# Patient Record
Sex: Female | Born: 1950 | Race: White | Hispanic: No | Marital: Married | Smoking: Never smoker
Health system: Southern US, Community
[De-identification: ages and names within clinical notes are randomized; demographics above are authoritative.]

## PROBLEM LIST (undated history)

## (undated) DIAGNOSIS — G43909 Migraine, unspecified, not intractable, without status migrainosus: Secondary | ICD-10-CM

## (undated) DIAGNOSIS — A8481 Powassan virus disease: Secondary | ICD-10-CM

## (undated) DIAGNOSIS — T8859XA Other complications of anesthesia, initial encounter: Secondary | ICD-10-CM

## (undated) DIAGNOSIS — G259 Extrapyramidal and movement disorder, unspecified: Secondary | ICD-10-CM

## (undated) DIAGNOSIS — M1990 Unspecified osteoarthritis, unspecified site: Secondary | ICD-10-CM

## (undated) DIAGNOSIS — G4700 Insomnia, unspecified: Secondary | ICD-10-CM

## (undated) DIAGNOSIS — E785 Hyperlipidemia, unspecified: Secondary | ICD-10-CM

## (undated) DIAGNOSIS — Z780 Asymptomatic menopausal state: Secondary | ICD-10-CM

## (undated) DIAGNOSIS — A848 Other tick-borne viral encephalitis: Secondary | ICD-10-CM

## (undated) DIAGNOSIS — L659 Nonscarring hair loss, unspecified: Secondary | ICD-10-CM

## (undated) DIAGNOSIS — Z9289 Personal history of other medical treatment: Secondary | ICD-10-CM

## (undated) DIAGNOSIS — T4145XA Adverse effect of unspecified anesthetic, initial encounter: Secondary | ICD-10-CM

## (undated) DIAGNOSIS — I491 Atrial premature depolarization: Secondary | ICD-10-CM

## (undated) HISTORY — PX: TRACHEOSTOMY: SUR1362

## (undated) HISTORY — DX: Personal history of other medical treatment: Z92.89

## (undated) HISTORY — PX: OTHER SURGICAL HISTORY: SHX169

## (undated) HISTORY — DX: Hyperlipidemia, unspecified: E78.5

## (undated) HISTORY — DX: Atrial premature depolarization: I49.1

## (undated) HISTORY — DX: Nonscarring hair loss, unspecified: L65.9

## (undated) HISTORY — DX: Insomnia, unspecified: G47.00

## (undated) HISTORY — PX: PEG PLACEMENT: SHX5437

## (undated) HISTORY — DX: Extrapyramidal and movement disorder, unspecified: G25.9

## (undated) HISTORY — DX: Migraine, unspecified, not intractable, without status migrainosus: G43.909

## (undated) HISTORY — DX: Asymptomatic menopausal state: Z78.0

---

## 1981-01-25 HISTORY — PX: RHINOPLASTY: SUR1284

## 1998-01-01 ENCOUNTER — Ambulatory Visit (HOSPITAL_BASED_OUTPATIENT_CLINIC_OR_DEPARTMENT_OTHER): Admission: RE | Admit: 1998-01-01 | Discharge: 1998-01-01 | Payer: Self-pay | Admitting: *Deleted

## 1998-01-25 HISTORY — PX: HERNIA REPAIR: SHX51

## 1999-07-20 ENCOUNTER — Other Ambulatory Visit: Admission: RE | Admit: 1999-07-20 | Discharge: 1999-07-20 | Payer: Self-pay | Admitting: Obstetrics and Gynecology

## 1999-12-21 ENCOUNTER — Other Ambulatory Visit: Admission: RE | Admit: 1999-12-21 | Discharge: 1999-12-21 | Payer: Self-pay | Admitting: Obstetrics and Gynecology

## 1999-12-22 ENCOUNTER — Other Ambulatory Visit: Admission: RE | Admit: 1999-12-22 | Discharge: 1999-12-22 | Payer: Self-pay | Admitting: Obstetrics and Gynecology

## 1999-12-22 ENCOUNTER — Encounter (INDEPENDENT_AMBULATORY_CARE_PROVIDER_SITE_OTHER): Payer: Self-pay | Admitting: Specialist

## 2001-05-15 ENCOUNTER — Other Ambulatory Visit: Admission: RE | Admit: 2001-05-15 | Discharge: 2001-05-15 | Payer: Self-pay | Admitting: Obstetrics and Gynecology

## 2004-03-09 ENCOUNTER — Ambulatory Visit: Payer: Self-pay | Admitting: Internal Medicine

## 2004-03-19 ENCOUNTER — Ambulatory Visit: Payer: Self-pay | Admitting: Internal Medicine

## 2004-09-21 ENCOUNTER — Ambulatory Visit: Payer: Self-pay | Admitting: Internal Medicine

## 2004-09-24 ENCOUNTER — Ambulatory Visit: Payer: Self-pay | Admitting: Internal Medicine

## 2007-09-20 ENCOUNTER — Ambulatory Visit: Payer: Self-pay | Admitting: Sports Medicine

## 2007-09-20 DIAGNOSIS — M7742 Metatarsalgia, left foot: Secondary | ICD-10-CM

## 2007-09-20 DIAGNOSIS — M2140 Flat foot [pes planus] (acquired), unspecified foot: Secondary | ICD-10-CM | POA: Insufficient documentation

## 2007-09-20 DIAGNOSIS — M2020 Hallux rigidus, unspecified foot: Secondary | ICD-10-CM

## 2007-09-20 DIAGNOSIS — M7741 Metatarsalgia, right foot: Secondary | ICD-10-CM

## 2008-01-31 ENCOUNTER — Ambulatory Visit: Payer: Self-pay | Admitting: Sports Medicine

## 2008-01-31 DIAGNOSIS — M21619 Bunion of unspecified foot: Secondary | ICD-10-CM

## 2009-03-19 ENCOUNTER — Encounter (INDEPENDENT_AMBULATORY_CARE_PROVIDER_SITE_OTHER): Payer: Self-pay | Admitting: *Deleted

## 2009-04-11 ENCOUNTER — Telehealth: Payer: Self-pay | Admitting: Internal Medicine

## 2009-04-14 ENCOUNTER — Encounter: Payer: Self-pay | Admitting: Internal Medicine

## 2009-06-26 ENCOUNTER — Ambulatory Visit: Payer: Self-pay | Admitting: Internal Medicine

## 2010-02-24 NOTE — Letter (Signed)
Summary: Colonoscopy Letter  [facility]  [address] [place], [place] [address]   Phone: [phone]  Fax: [phone]      March 19, 2009 MRN: [id number]   Judy Spears [address] [place], [place]  [address]   Dear Judy Spears,   According to your medical record, it is time for you to schedule a Colonoscopy. The American Cancer Society recommends this procedure as a method to detect early colon cancer. Patients with a family history of colon cancer, or a personal history of colon polyps or inflammatory bowel disease are at increased risk.  This letter has beeen generated based on the recommendations made at the time of your procedure. If you feel that in your particular situation this may no longer apply, please contact our office.  Please call our office at [phone] to schedule this appointment or to update your records at your earliest convenience.  Thank you for cooperating with [place] to provide you with the very best care possible.   Sincerely,  Hedwig Morton. Juanda Chance, M.D.  [facility] [phone]

## 2010-02-24 NOTE — Letter (Signed)
Summary: [facility] Instructions  [facility]  [address] [place], [place] [address]   Phone: [phone]  Fax: [phone]       Judy Spears    Jun 15, 1950    MRN: [id number]       Procedure Day /Date: 05/26/09 Monday     Arrival Time: 7:30 am     Procedure Time: 8:00 am     Location of Procedure:                    _x _  [facility] (4th Floor)  PREPARATION FOR COLONOSCOPY WITH MIRALAX  Starting 5 days prior to your procedure (05/22/09)  do not eat nuts, seeds, popcorn, corn, beans, peas,  salads, or any raw vegetables.  Do not take any fiber supplements (e.g. Metamucil, Citrucel, and Benefiber). ____________________________________________________________________________________________________   THE DAY BEFORE YOUR PROCEDURE         DATE: 05/25/09 DAY: Sunday  1   Drink clear liquids the entire day-NO SOLID FOOD  2   Do not drink anything colored red or purple.  Avoid juices with pulp.  No orange juice.  3   Drink at least 64 oz. (8 glasses) of fluid/clear liquids during the day to prevent dehydration and help the prep work efficiently.  CLEAR LIQUIDS INCLUDE: Water Jello Ice Popsicles Tea (sugar ok, no milk/cream) Powdered fruit flavored drinks Coffee (sugar ok, no milk/cream) Gatorade Juice: apple, white grape, white cranberry  Lemonade Clear bullion, consomm, broth Carbonated beverages (any kind) Strained chicken noodle soup Hard Candy  4   Mix the entire bottle of Miralax with 64 oz. of Gatorade/Powerade in the morning and put in the refrigerator to chill.  5   At 3:00 pm take 2 Dulcolax/Bisacodyl tablets.  6   At 4:30 pm take one Reglan/Metoclopramide tablet.  7  Starting at 5:00 pm drink one 8 oz glass of the Miralax mixture every 15-20 minutes until you have finished drinking the entire 64 oz.  You should finish drinking prep around 7:30 or 8:00 pm.  8   If you are nauseated, you may take the 2nd Reglan/Metoclopramide tablet at  6:30 pm.        9    At 8:00 pm take 2 more DULCOLAX/Bisacodyl tablets.     THE DAY OF YOUR PROCEDURE      DATE:  05/26/09 DAY: Monday  You may drink clear liquids until 6:00 am  (2 HOURS BEFORE PROCEDURE).   MEDICATION INSTRUCTIONS  Unless otherwise instructed, you should take regular prescription medications with a small sip of water as early as possible the morning of your procedure.        OTHER INSTRUCTIONS  You will need a responsible adult at least 59 years of age to accompany you and drive you home.   This person must remain in the waiting room during your procedure.  Wear loose fitting clothing that is easily removed.  Leave jewelry and other valuables at home.  However, you may wish to bring a book to read or an iPod/MP3 player to listen to music as you wait for your procedure to start.  Remove all body piercing jewelry and leave at home.  Total time from sign-in until discharge is approximately 2-3 hours.  You should go home directly after your procedure and rest.  You can resume normal activities the day after your procedure.  The day of your procedure you should not:   Drive   Make legal decisions  Operate machinery   Drink alcohol   Return to work  You will receive specific instructions about eating, activities and medications before you leave.   The above instructions have been reviewed and explained to me by  Dr Lina Sar    I fully understand and can verbalize these instructions _____________________________ Date _______

## 2010-02-24 NOTE — Procedures (Signed)
Summary: Colonoscopy  Patient: Judy Spears Note: All result statuses are Final unless otherwise noted.  Tests: (1) Colonoscopy (COL)   COL Colonoscopy           DONE     [facility]     [address]     [place], [place]  [address]           COLONOSCOPY PROCEDURE REPORT           PATIENT:  Judy Spears, Judy Spears  MR#:  [id number]     BIRTHDATE:  1950-06-15, 59 yrs. old  GENDER:  female     ENDOSCOPIST:  Hedwig Morton. Juanda Chance, MD     REF. BY:  Mia Creek, M.D.     PROCEDURE DATE:  06/26/2009     PROCEDURE:  Colonoscopy 19147     ASA CLASS:  Class I     INDICATIONS:  father with colon cancer in 70ies     MEDICATIONS:   Versed 7 mg, Fentanyl 75 mcg           DESCRIPTION OF PROCEDURE:   After the risks benefits and     alternatives of the procedure were thoroughly explained, informed     consent was obtained.  Digital rectal exam was performed and     revealed no rectal masses.   The LB [id number]-[id number] endoscope     was introduced through the anus and advanced to the cecum, which     was identified by both the appendix and ileocecal valve, without     limitations.  The quality of the prep was excellent, using     MiraLax.  The instrument was then slowly withdrawn as the colon     was fully examined.     <<PROCEDUREIMAGES>>           FINDINGS:  No polyps or cancers were seen (see image1, image2,     image3, image4, and image5).   Retroflexed views in the rectum     revealed no abnormalities.    The scope was then withdrawn from     the patient and the procedure completed.           COMPLICATIONS:  None     ENDOSCOPIC IMPRESSION:     1) No polyps or cancers     2) Normal colonoscopy     RECOMMENDATIONS:     1) high fiber diet     REPEAT EXAM:  In 7 year(s) for. 5-10 year interval is OK, this was     a second normal colonoscopy, the family member was >9 year old at     the time of his Colon cancer dignosis           ______________________________     Hedwig Morton. Juanda Chance, MD       CC:           n.     eSIGNED:   Hedwig Morton. Sharah Finnell at 06/26/2009 02:10 PM           Joanna Hews, [phone]  Note: An exclamation mark (!) indicates a result that was not dispersed into the flowsheet. Document Creation Date: 06/26/2009 2:10 PM _______________________________________________________________________  (1) Order result status: Final Collection or observation date-time: 06/26/2009 13:59 Requested date-time:  Receipt date-time:  Reported date-time:  Referring Physician:   Ordering Physician: Lina Sar [phone]) Specimen Source:  Source: Launa Grill Order Number: [id number] Lab site:   Appended Document: Colonoscopy    Clinical Lists Changes  Observations:  Added new observation of COLONNXTDUE: 06/2016 (06/26/2009 16:14)

## 2010-02-24 NOTE — Progress Notes (Signed)
Summary: Schedule Colonoscopy  Phone Note Other Incoming   Summary of Call: Per Dr Juanda Chance, she spoke to patient who needs recall colonoscopy in May 2011. Patient has been scheduled and instructions have been created. Dr Juanda Chance states that she will give patient the instructions and have her sign the appropriate paperwork.  Initial call taken by: Hortense Ramal CMA Duncan Dull),  April 11, 2009 10:51 AM    New/Updated Medications: MIRALAX   POWD (POLYETHYLENE GLYCOL 3350) As per prep  instructions. REGLAN 10 MG  TABS (METOCLOPRAMIDE HCL) As per prep instructions. DULCOLAX 5 MG  TBEC (BISACODYL) Day before procedure take 2 at 3pm and 2 at 8pm. Prescriptions: DULCOLAX 5 MG  TBEC (BISACODYL) Day before procedure take 2 at 3pm and 2 at 8pm.  #4 x 0   Entered by:   Hortense Ramal CMA (AAMA)   Authorized by:   Hart Carwin MD   Signed by:   Hortense Ramal CMA (AAMA) on 04/11/2009   Method used:   Print then Give to Patient   RxID:   [id number] REGLAN 10 MG  TABS (METOCLOPRAMIDE HCL) As per prep instructions.  #2 x 0   Entered by:   Hortense Ramal CMA (AAMA)   Authorized by:   Hart Carwin MD   Signed by:   Hortense Ramal CMA (AAMA) on 04/11/2009   Method used:   Print then Give to Patient   RxID:   [id number] MIRALAX   POWD (POLYETHYLENE GLYCOL 3350) As per prep  instructions.  #255 grams x 0   Entered by:   Hortense Ramal CMA (AAMA)   Authorized by:   Hart Carwin MD   Signed by:   Hortense Ramal CMA (AAMA) on 04/11/2009   Method used:   Print then Give to Patient   RxID:   [id number]   Appended Document: Orders Update    Clinical Lists Changes  Problems: Added new problem of SCREENING, COLON CANCER (ICD-V76.51) Orders: Added new Test order of Colonoscopy (Colon) - Signed

## 2010-12-04 ENCOUNTER — Other Ambulatory Visit: Payer: Self-pay | Admitting: Gynecology

## 2010-12-04 DIAGNOSIS — R928 Other abnormal and inconclusive findings on diagnostic imaging of breast: Secondary | ICD-10-CM

## 2010-12-07 ENCOUNTER — Ambulatory Visit
Admission: RE | Admit: 2010-12-07 | Discharge: 2010-12-07 | Disposition: A | Discharge status: Home / Self Care | Payer: Private Health Insurance - Indemnity | Source: Ambulatory Visit | Attending: Gynecology | Admitting: Gynecology

## 2010-12-07 ENCOUNTER — Other Ambulatory Visit: Payer: Self-pay | Admitting: Gynecology

## 2010-12-07 DIAGNOSIS — R928 Other abnormal and inconclusive findings on diagnostic imaging of breast: Secondary | ICD-10-CM

## 2010-12-07 DIAGNOSIS — R921 Mammographic calcification found on diagnostic imaging of breast: Secondary | ICD-10-CM

## 2010-12-08 ENCOUNTER — Ambulatory Visit
Admission: RE | Admit: 2010-12-08 | Discharge: 2010-12-08 | Disposition: A | Discharge status: Home / Self Care | Payer: Private Health Insurance - Indemnity | Source: Ambulatory Visit | Attending: Gynecology | Admitting: Gynecology

## 2010-12-08 DIAGNOSIS — R921 Mammographic calcification found on diagnostic imaging of breast: Secondary | ICD-10-CM

## 2010-12-21 ENCOUNTER — Other Ambulatory Visit (INDEPENDENT_AMBULATORY_CARE_PROVIDER_SITE_OTHER): Payer: Self-pay | Admitting: General Surgery

## 2010-12-21 ENCOUNTER — Encounter (INDEPENDENT_AMBULATORY_CARE_PROVIDER_SITE_OTHER): Payer: Self-pay | Admitting: General Surgery

## 2010-12-21 ENCOUNTER — Ambulatory Visit (INDEPENDENT_AMBULATORY_CARE_PROVIDER_SITE_OTHER): Payer: Self-pay | Admitting: General Surgery

## 2010-12-21 ENCOUNTER — Ambulatory Visit (INDEPENDENT_AMBULATORY_CARE_PROVIDER_SITE_OTHER): Payer: Private Health Insurance - Indemnity | Admitting: General Surgery

## 2010-12-21 VITALS — BP 128/78 | HR 60 | Temp 97.2°F | Resp 18 | Ht 64.0 in | Wt 116.2 lb

## 2010-12-21 DIAGNOSIS — N6099 Unspecified benign mammary dysplasia of unspecified breast: Secondary | ICD-10-CM

## 2010-12-21 DIAGNOSIS — N6019 Diffuse cystic mastopathy of unspecified breast: Secondary | ICD-10-CM

## 2010-12-21 DIAGNOSIS — R921 Mammographic calcification found on diagnostic imaging of breast: Secondary | ICD-10-CM

## 2010-12-21 DIAGNOSIS — N62 Hypertrophy of breast: Secondary | ICD-10-CM

## 2010-12-21 NOTE — Progress Notes (Signed)
Patient ID: Judy Spears, female   DOB: 08/15/1950, 60 y.o.   MRN: [id number]  Chief Complaint  Patient presents with  . New Evaluation    eval of ALH     HPI Judy Spears is a 60 y.o. female.  Referred by Dr. Elizabeth Spears HPI This is a 60-year-old female who is otherwise very healthy who presents after a screening mammogram. She had a bilateral mammogram with some left-sided microcalcifications. These underwent a biopsy showing atypical lobular hyperplasia. She has no history of any breast complaints her abnormal mammograms. She has no family history of any breast cancer. She has no other complaints referable to her breasts either.  History reviewed. No pertinent past medical history.  Past Surgical History  Procedure Date  . Hernia repair 2000    RIH  . Rhinoplasty 1983  . Cesarean section 1986    Family History  Problem Relation Age of Onset  . Stroke Mother   . Hypertension Mother     Social History History  Substance Use Topics  . Smoking status: Never Smoker   . Smokeless tobacco: Never Used  . Alcohol Use: Yes    Allergies  Allergen Reactions  . Tetracyclines & Related     Blisters     Current Outpatient Prescriptions  Medication Sig Dispense Refill  . ALPRAZolam (XANAX) 0.5 MG tablet       . COMBIPATCH 0.05-0.14 MG/DAY         Review of Systems Review of Systems  Constitutional: Negative for fever, chills and unexpected weight change.  HENT: Negative for hearing loss, congestion, sore throat, trouble swallowing and voice change.   Eyes: Negative for visual disturbance.  Respiratory: Negative for cough and wheezing.   Cardiovascular: Negative for chest pain, palpitations and leg swelling.  Gastrointestinal: Negative for nausea, vomiting, abdominal pain, diarrhea, constipation, blood in stool, abdominal distention and anal bleeding.  Genitourinary: Negative for hematuria, vaginal bleeding and difficulty urinating.  Musculoskeletal: Negative for  arthralgias.  Skin: Negative for rash and wound.  Neurological: Positive for headaches. Negative for seizures and syncope.  Hematological: Negative for adenopathy. Does not bruise/bleed easily.  Psychiatric/Behavioral: Negative for confusion.    Blood pressure 128/78, pulse 60, temperature 97.2 F (36.2 C), temperature source Temporal, resp. rate 18, height 5' 4" (1.626 m), weight 116 lb 4 oz (52.731 kg), last menstrual period 12/01/2010.  Physical Exam Physical Exam  Constitutional: She appears well-developed and well-nourished.  Eyes: No scleral icterus.  Neck: Neck supple.  Cardiovascular: Normal rate, regular rhythm and normal heart sounds.   Pulmonary/Chest: Effort normal and breath sounds normal. She has no wheezes. She has no rales. Right breast exhibits no inverted nipple, no mass, no nipple discharge, no skin change and no tenderness. Left breast exhibits no inverted nipple, no mass, no nipple discharge, no skin change and no tenderness. Breasts are symmetrical.  Lymphadenopathy:    She has no cervical adenopathy.    She has no axillary adenopathy.       Right: No supraclavicular adenopathy present.       Left: No supraclavicular adenopathy present.    Data Reviewed Reviewed mammogram and ultrasound, pathology results *RADIOLOGY REPORT*  Clinical Data: The patient returns for evaluation of a possible  mass in the right breast and calcifications in the left breast.  DIGITAL DIAGNOSTIC BILATERAL LIMITED MAMMOGRAM AND BREAST  ULTRASOUND:  Comparison: 12/01/2009, 11/26/2008 from [facility] and  [facility].  Findings: Additional views on the right demonstrate a partially    circumscribed, partially obscured oval nodule in the right upper  inner quadrant posteriorly.  Additional magnification views on the left demonstrate a 4 mm  cluster of slightly pleomorphic microcalcifications in the left  anterior upper outer quadrant.  On physical exam, no mass is  palpated in either breast.  Ultrasound is performed, showing an oval cyst at 2 o'clock 5-6 cm  from the right nipple measuring 5 x 2 x 4 mm.  The calcifications in the left breast are suspicious and biopsy is  suggested. Stereotactic core needle biopsy and surgical excisional  biopsy were discussed with the patient. I suggested stereotactic  core needle biopsy and the patient agreed with this plan.  IMPRESSION:  1. 4 mm cluster of suspicious calcifications in the left anterior  upper outer quadrant. Stereotactic core needle biopsy has been  scheduled for 12/08/2010.  2. Benign cyst in the right upper inner quadrant.   Assessment    Left breast ALH    Plan    We had an extensive discussion about atypical lobular hyperplasia and lobular neoplasia. We discussed the rationale for excisional biopsy as well as the option of observation. I think given her health, age, and findings on core biopsy that the best plan would be to proceed with an excisional biopsy. We discussed a wire localization biopsy of this area. We discussed the placement of the wire as well as the technical performance of the operation. We discussed the risks including, but not limited to, bleeding, infection, missing the clip, and further surgery if there are cancer cells that are found on the biopsy. She and her husband both understand this and we'll proceed on December 7 with surgery.       Judy Spears 12/21/2010, 9:05 AM    

## 2010-12-24 ENCOUNTER — Encounter (HOSPITAL_BASED_OUTPATIENT_CLINIC_OR_DEPARTMENT_OTHER): Payer: Self-pay | Admitting: *Deleted

## 2010-12-24 NOTE — Progress Notes (Signed)
Pt will not come back to [place] until eve prior to surgery Dr Dwain Sarna told her she can do same day labs She will have to have ekg,cbc, and bmet am surg on arrival from [place]. Anesthesia needs to be aware of post op headaches

## 2010-12-25 ENCOUNTER — Other Ambulatory Visit (INDEPENDENT_AMBULATORY_CARE_PROVIDER_SITE_OTHER): Payer: Self-pay

## 2010-12-25 DIAGNOSIS — N6320 Unspecified lump in the left breast, unspecified quadrant: Secondary | ICD-10-CM

## 2011-01-01 ENCOUNTER — Encounter (HOSPITAL_BASED_OUTPATIENT_CLINIC_OR_DEPARTMENT_OTHER): Payer: Self-pay | Admitting: Certified Registered Nurse Anesthetist

## 2011-01-01 ENCOUNTER — Other Ambulatory Visit: Payer: Self-pay

## 2011-01-01 ENCOUNTER — Ambulatory Visit (HOSPITAL_BASED_OUTPATIENT_CLINIC_OR_DEPARTMENT_OTHER)
Admission: RE | Admit: 2011-01-01 | Discharge: 2011-01-01 | Disposition: A | Discharge status: Home / Self Care | Payer: Private Health Insurance - Indemnity | Source: Ambulatory Visit | Attending: General Surgery | Admitting: General Surgery

## 2011-01-01 ENCOUNTER — Other Ambulatory Visit (INDEPENDENT_AMBULATORY_CARE_PROVIDER_SITE_OTHER): Payer: Self-pay | Admitting: General Surgery

## 2011-01-01 ENCOUNTER — Ambulatory Visit
Admission: RE | Admit: 2011-01-01 | Discharge: 2011-01-01 | Disposition: A | Discharge status: Home / Self Care | Payer: Private Health Insurance - Indemnity | Source: Ambulatory Visit | Attending: General Surgery | Admitting: General Surgery

## 2011-01-01 ENCOUNTER — Ambulatory Visit (HOSPITAL_BASED_OUTPATIENT_CLINIC_OR_DEPARTMENT_OTHER): Payer: Private Health Insurance - Indemnity | Admitting: Certified Registered Nurse Anesthetist

## 2011-01-01 ENCOUNTER — Encounter (HOSPITAL_BASED_OUTPATIENT_CLINIC_OR_DEPARTMENT_OTHER)
Admission: RE | Disposition: A | Discharge status: Home / Self Care | Payer: Self-pay | Source: Ambulatory Visit | Attending: General Surgery

## 2011-01-01 DIAGNOSIS — R928 Other abnormal and inconclusive findings on diagnostic imaging of breast: Secondary | ICD-10-CM | POA: Insufficient documentation

## 2011-01-01 DIAGNOSIS — N6320 Unspecified lump in the left breast, unspecified quadrant: Secondary | ICD-10-CM

## 2011-01-01 DIAGNOSIS — N6019 Diffuse cystic mastopathy of unspecified breast: Secondary | ICD-10-CM

## 2011-01-01 HISTORY — PX: BREAST BIOPSY: SHX20

## 2011-01-01 HISTORY — DX: Adverse effect of unspecified anesthetic, initial encounter: T41.45XA

## 2011-01-01 HISTORY — DX: Other complications of anesthesia, initial encounter: T88.59XA

## 2011-01-01 HISTORY — DX: Unspecified osteoarthritis, unspecified site: M19.90

## 2011-01-01 LAB — CBC
HCT: 38.4 % (ref 36.0–46.0)
Hemoglobin: 12.9 g/dL (ref 12.0–15.0)
MCH: 29.2 pg (ref 26.0–34.0)
MCHC: 33.6 g/dL (ref 30.0–36.0)
RDW: 13.2 % (ref 11.5–15.5)

## 2011-01-01 LAB — BASIC METABOLIC PANEL
BUN: 10 mg/dL (ref 6–23)
Creatinine, Ser: 0.76 mg/dL (ref 0.50–1.10)
GFR calc Af Amer: >90 mL/min (ref >90)
GFR calc non Af Amer: >90 mL/min (ref >90)
Glucose, Bld: 92 mg/dL (ref 70–99)
Potassium: 3.8 mEq/L (ref 3.5–5.1)

## 2011-01-01 SURGERY — BREAST BIOPSY WITH NEEDLE LOCALIZATION
Anesthesia: General | Site: Breast | Laterality: Left | Wound class: Clean

## 2011-01-01 MED ORDER — FENTANYL CITRATE 0.05 MG/ML IJ SOLN: 25.0000 ug | INTRAMUSCULAR | Status: DC | PRN

## 2011-01-01 MED ORDER — LACTATED RINGERS IV SOLN
INTRAVENOUS | Status: DC
  Administered 2011-01-01 (×2): via INTRAVENOUS

## 2011-01-01 MED ORDER — OXYCODONE-ACETAMINOPHEN 5-325 MG PO TABS: 1.0000 | ORAL_TABLET | Freq: Four times a day (QID) | ORAL | Status: AC | PRN

## 2011-01-01 MED ORDER — BUPIVACAINE HCL (PF) 0.25 % IJ SOLN
INTRAMUSCULAR | Status: DC | PRN
  Administered 2011-01-01: 20 mL

## 2011-01-01 MED ORDER — CEFAZOLIN SODIUM 1-5 GM-% IV SOLN
1.0000 g | INTRAVENOUS | Status: AC
  Administered 2011-01-01: 1 g via INTRAVENOUS

## 2011-01-01 MED ORDER — PROPOFOL 10 MG/ML IV EMUL
INTRAVENOUS | Status: DC | PRN
  Administered 2011-01-01: 200 mg via INTRAVENOUS

## 2011-01-01 MED ORDER — ONDANSETRON HCL 4 MG/2ML IJ SOLN
INTRAMUSCULAR | Status: DC | PRN
  Administered 2011-01-01: 4 mg via INTRAVENOUS

## 2011-01-01 MED ORDER — LIDOCAINE HCL (CARDIAC) 20 MG/ML IV SOLN
INTRAVENOUS | Status: DC | PRN
  Administered 2011-01-01: 40 mg via INTRAVENOUS

## 2011-01-01 MED ORDER — DEXAMETHASONE SODIUM PHOSPHATE 4 MG/ML IJ SOLN
INTRAMUSCULAR | Status: DC | PRN
  Administered 2011-01-01: 10 mg via INTRAVENOUS

## 2011-01-01 MED ORDER — PROMETHAZINE HCL 25 MG/ML IJ SOLN: 6.2500 mg | INTRAMUSCULAR | Status: DC | PRN

## 2011-01-01 MED ORDER — MIDAZOLAM HCL 5 MG/5ML IJ SOLN
INTRAMUSCULAR | Status: DC | PRN
  Administered 2011-01-01: 1 mg via INTRAVENOUS

## 2011-01-01 MED ORDER — FENTANYL CITRATE 0.05 MG/ML IJ SOLN
INTRAMUSCULAR | Status: DC | PRN
  Administered 2011-01-01: 50 ug via INTRAVENOUS

## 2011-01-01 MED ORDER — KETOROLAC TROMETHAMINE 30 MG/ML IJ SOLN
INTRAMUSCULAR | Status: DC | PRN
  Administered 2011-01-01: 30 mg via INTRAVENOUS

## 2011-01-01 SURGICAL SUPPLY — 54 items
ADH SKN CLS APL DERMABOND .7 (GAUZE/BANDAGES/DRESSINGS)
APL SKNCLS STERI-STRIP NONHPOA (GAUZE/BANDAGES/DRESSINGS) ×1
APPLIER CLIP 9.375 MED OPEN (MISCELLANEOUS)
APR CLP MED 9.3 20 MLT OPN (MISCELLANEOUS)
BENZOIN TINCTURE PRP APPL 2/3 (GAUZE/BANDAGES/DRESSINGS) ×2 IMPLANT
BINDER BREAST LRG (GAUZE/BANDAGES/DRESSINGS) ×1 IMPLANT
BINDER BREAST MEDIUM (GAUZE/BANDAGES/DRESSINGS) IMPLANT
BINDER BREAST XLRG (GAUZE/BANDAGES/DRESSINGS) IMPLANT
BINDER BREAST XXLRG (GAUZE/BANDAGES/DRESSINGS) IMPLANT
BLADE SURG 15 STRL LF DISP TIS (BLADE) ×1 IMPLANT
BLADE SURG 15 STRL SS (BLADE) ×2
CANISTER SUCTION 1200CC (MISCELLANEOUS) IMPLANT
CHLORAPREP W/TINT 26ML (MISCELLANEOUS) ×2 IMPLANT
CLIP APPLIE 9.375 MED OPEN (MISCELLANEOUS) IMPLANT
CLOTH BEACON ORANGE TIMEOUT ST (SAFETY) ×2 IMPLANT
COVER MAYO STAND STRL (DRAPES) ×2 IMPLANT
COVER TABLE BACK 60X90 (DRAPES) ×2 IMPLANT
DECANTER SPIKE VIAL GLASS SM (MISCELLANEOUS) IMPLANT
DERMABOND ADVANCED (GAUZE/BANDAGES/DRESSINGS)
DERMABOND ADVANCED .7 DNX12 (GAUZE/BANDAGES/DRESSINGS) IMPLANT
DEVICE DUBIN W/COMP PLATE 8390 (MISCELLANEOUS) ×1 IMPLANT
DRAPE PED LAPAROTOMY (DRAPES) ×2 IMPLANT
DRSG TEGADERM 4X4.75 (GAUZE/BANDAGES/DRESSINGS) ×2 IMPLANT
ELECT COATED BLADE 2.86 ST (ELECTRODE) ×2 IMPLANT
ELECT REM PT RETURN 9FT ADLT (ELECTROSURGICAL) ×2
ELECTRODE REM PT RTRN 9FT ADLT (ELECTROSURGICAL) ×1 IMPLANT
GAUZE SPONGE 4X4 12PLY STRL LF (GAUZE/BANDAGES/DRESSINGS) ×2 IMPLANT
GLOVE BIO SURGEON STRL SZ7 (GLOVE) ×4 IMPLANT
GLOVE BIOGEL PI IND STRL 7.5 (GLOVE) ×1 IMPLANT
GLOVE BIOGEL PI INDICATOR 7.5 (GLOVE) ×1
GLOVE ECLIPSE 6.5 STRL STRAW (GLOVE) ×1 IMPLANT
GOWN PREVENTION PLUS XLARGE (GOWN DISPOSABLE) ×3 IMPLANT
KIT MARKER MARGIN INK (KITS) ×1 IMPLANT
NDL HYPO 25X1 1.5 SAFETY (NEEDLE) ×1 IMPLANT
NEEDLE HYPO 25X1 1.5 SAFETY (NEEDLE) ×2 IMPLANT
NS IRRIG 1000ML POUR BTL (IV SOLUTION) ×1 IMPLANT
PACK BASIN DAY SURGERY FS (CUSTOM PROCEDURE TRAY) ×2 IMPLANT
PENCIL BUTTON HOLSTER BLD 10FT (ELECTRODE) ×2 IMPLANT
SLEEVE SCD COMPRESS KNEE MED (MISCELLANEOUS) ×2 IMPLANT
SPONGE LAP 4X18 X RAY DECT (DISPOSABLE) ×2 IMPLANT
STRIP CLOSURE SKIN 1/2X4 (GAUZE/BANDAGES/DRESSINGS) ×2 IMPLANT
SUT MNCRL AB 4-0 PS2 18 (SUTURE) ×2 IMPLANT
SUT SILK 2 0 SH (SUTURE) ×2 IMPLANT
SUT VIC AB 2-0 SH 27 (SUTURE) ×2
SUT VIC AB 2-0 SH 27XBRD (SUTURE) ×1 IMPLANT
SUT VIC AB 3-0 SH 27 (SUTURE) ×2
SUT VIC AB 3-0 SH 27X BRD (SUTURE) ×1 IMPLANT
SUT VICRYL AB 3 0 TIES (SUTURE) IMPLANT
SYR CONTROL 10ML LL (SYRINGE) ×2 IMPLANT
TOWEL OR 17X24 6PK STRL BLUE (TOWEL DISPOSABLE) ×2 IMPLANT
TOWEL OR NON WOVEN STRL DISP B (DISPOSABLE) ×2 IMPLANT
TUBE CONNECTING 20X1/4 (TUBING) IMPLANT
WATER STERILE IRR 1000ML POUR (IV SOLUTION) ×1 IMPLANT
YANKAUER SUCT BULB TIP NO VENT (SUCTIONS) IMPLANT

## 2011-01-01 NOTE — Anesthesia Procedure Notes (Signed)
Procedure Name: LMA Insertion Date/Time: 01/01/2011 10:27 AM Performed by: Syon Tews D Pre-anesthesia Checklist: Patient identified, Emergency Drugs available, Suction available and Patient being monitored Patient Re-evaluated:Patient Re-evaluated prior to inductionOxygen Delivery Method: Circle System Utilized Preoxygenation: Pre-oxygenation with 100% oxygen Intubation Type: IV induction Ventilation: Mask ventilation without difficulty LMA: LMA inserted LMA Size: 4.0 Number of attempts: 1 Placement Confirmation: positive ETCO2 Tube secured with: Tape Dental Injury: Teeth and Oropharynx as per pre-operative assessment

## 2011-01-01 NOTE — Transfer of Care (Signed)
Immediate Anesthesia Transfer of Care Note  Patient: Judy Spears  Procedure(s) Performed:  BREAST BIOPSY WITH NEEDLE LOCALIZATION - left breast wire localization biopsy  Patient Location: PACU  Anesthesia Type: General  Level of Consciousness: awake, alert , oriented and patient cooperative  Airway & Oxygen Therapy: Patient Spontanous Breathing and Patient connected to face mask oxygen  Post-op Assessment: Report given to PACU RN and Post -op Vital signs reviewed and stable  Post vital signs: Reviewed and stable  Complications: No apparent anesthesia complications

## 2011-01-01 NOTE — Anesthesia Postprocedure Evaluation (Signed)
  Anesthesia Post-op Note  Patient: Judy Spears  Procedure(s) Performed:  BREAST BIOPSY WITH NEEDLE LOCALIZATION - left breast wire localization biopsy  Patient Location: PACU  Anesthesia Type: General  Level of Consciousness: awake, alert  and oriented  Airway and Oxygen Therapy: Patient Spontanous Breathing  Post-op Pain: none  Post-op Assessment: Post-op Vital signs reviewed, Patient's Cardiovascular Status Stable, Respiratory Function Stable, Patent Airway, No signs of Nausea or vomiting, Adequate PO intake and Pain level controlled  Post-op Vital Signs: Reviewed and stable  Complications: No apparent anesthesia complications

## 2011-01-01 NOTE — Interval H&P Note (Signed)
History and Physical Interval Note:  01/01/2011 10:03 AM  Judy Spears  has presented today for surgery, with the diagnosis of left breast   The various methods of treatment have been discussed with the patient and family. After consideration of risks, benefits and other options for treatment, the patient has consented to  Procedure(s): LEFT BREAST BIOPSY WITH NEEDLE LOCALIZATION as a surgical intervention .  The patients' history has been reviewed, patient examined, no change in status, stable for surgery.  I have reviewed the patients' chart and labs.  Questions were answered to the patient's satisfaction.     [name]

## 2011-01-01 NOTE — H&P (View-Only) (Signed)
Patient ID: ILZE ROSELLI, female   DOB: [birth year], 60 y.o.   MRN: [id number]  Chief Complaint  Patient presents with  . New Evaluation    eval of ALH     HPI SHAQUANDA GRAVES is a 60 y.o. female.  Referred by Dr. Cain Saupe HPI This is a 60 year old female who is otherwise very healthy who presents after a screening mammogram. She had a bilateral mammogram with some left-sided microcalcifications. These underwent a biopsy showing atypical lobular hyperplasia. She has no history of any breast complaints her abnormal mammograms. She has no family history of any breast cancer. She has no other complaints referable to her breasts either.  History reviewed. No pertinent past medical history.  Past Surgical History  Procedure Date  . Hernia repair 2000    RIH  . Rhinoplasty 1983  . Cesarean section 1986    Family History  Problem Relation Age of Onset  . Stroke Mother   . Hypertension Mother     Social History History  Substance Use Topics  . Smoking status: Never Smoker   . Smokeless tobacco: Never Used  . Alcohol Use: Yes    Allergies  Allergen Reactions  . Tetracyclines & Related     Blisters     Current Outpatient Prescriptions  Medication Sig Dispense Refill  . ALPRAZolam (XANAX) 0.5 MG tablet       . COMBIPATCH 0.05-0.14 MG/DAY         Review of Systems Review of Systems  Constitutional: Negative for fever, chills and unexpected weight change.  HENT: Negative for hearing loss, congestion, sore throat, trouble swallowing and voice change.   Eyes: Negative for visual disturbance.  Respiratory: Negative for cough and wheezing.   Cardiovascular: Negative for chest pain, palpitations and leg swelling.  Gastrointestinal: Negative for nausea, vomiting, abdominal pain, diarrhea, constipation, blood in stool, abdominal distention and anal bleeding.  Genitourinary: Negative for hematuria, vaginal bleeding and difficulty urinating.  Musculoskeletal: Negative for  arthralgias.  Skin: Negative for rash and wound.  Neurological: Positive for headaches. Negative for seizures and syncope.  Hematological: Negative for adenopathy. Does not bruise/bleed easily.  Psychiatric/Behavioral: Negative for confusion.    Blood pressure 128/78, pulse 60, temperature 97.2 F (36.2 C), temperature source Temporal, resp. rate 18, height 5\' 4"  (1.626 m), weight 116 lb 4 oz (52.731 kg), last menstrual period 12/01/2010.  Physical Exam Physical Exam  Constitutional: She appears well-developed and well-nourished.  Eyes: No scleral icterus.  Neck: Neck supple.  Cardiovascular: Normal rate, regular rhythm and normal heart sounds.   Pulmonary/Chest: Effort normal and breath sounds normal. She has no wheezes. She has no rales. Right breast exhibits no inverted nipple, no mass, no nipple discharge, no skin change and no tenderness. Left breast exhibits no inverted nipple, no mass, no nipple discharge, no skin change and no tenderness. Breasts are symmetrical.  Lymphadenopathy:    She has no cervical adenopathy.    She has no axillary adenopathy.       Right: No supraclavicular adenopathy present.       Left: No supraclavicular adenopathy present.    Data Reviewed Reviewed mammogram and ultrasound, pathology results *RADIOLOGY REPORT*  Clinical Data: The patient returns for evaluation of a possible  mass in the right breast and calcifications in the left breast.  DIGITAL DIAGNOSTIC BILATERAL LIMITED MAMMOGRAM AND BREAST  ULTRASOUND:  Comparison: 12/01/2009, 11/26/2008 from [facility] and  [facility].  Findings: Additional views on the right demonstrate a partially  circumscribed, partially obscured oval nodule in the right upper  inner quadrant posteriorly.  Additional magnification views on the left demonstrate a 4 mm  cluster of slightly pleomorphic microcalcifications in the left  anterior upper outer quadrant.  On physical exam, no mass is  palpated in either breast.  Ultrasound is performed, showing an oval cyst at 2 o'clock 5-6 cm  from the right nipple measuring 5 x 2 x 4 mm.  The calcifications in the left breast are suspicious and biopsy is  suggested. Stereotactic core needle biopsy and surgical excisional  biopsy were discussed with the patient. I suggested stereotactic  core needle biopsy and the patient agreed with this plan.  IMPRESSION:  1. 4 mm cluster of suspicious calcifications in the left anterior  upper outer quadrant. Stereotactic core needle biopsy has been  scheduled for 12/08/2010.  2. Benign cyst in the right upper inner quadrant.   Assessment    Left breast ALH    Plan    We had an extensive discussion about atypical lobular hyperplasia and lobular neoplasia. We discussed the rationale for excisional biopsy as well as the option of observation. I think given her health, age, and findings on core biopsy that the best plan would be to proceed with an excisional biopsy. We discussed a wire localization biopsy of this area. We discussed the placement of the wire as well as the technical performance of the operation. We discussed the risks including, but not limited to, bleeding, infection, missing the clip, and further surgery if there are cancer cells that are found on the biopsy. She and her husband both understand this and we'll proceed on December 7 with surgery.       Elza Sortor 12/21/2010, 9:05 AM

## 2011-01-01 NOTE — Anesthesia Preprocedure Evaluation (Addendum)
Anesthesia Evaluation  Patient identified by MRN, date of birth, ID band Patient awake    Reviewed: Allergy & Precautions, H&P , NPO status , Patient's Chart, lab work & pertinent test results  History of Anesthesia Complications Negative for: history of anesthetic complications  Airway Mallampati: I      Dental No notable dental hx. (+) Teeth Intact and Dental Advisory Given   Pulmonary neg pulmonary ROS,  clear to auscultation  Pulmonary exam normal       Cardiovascular neg cardio ROS Regular Normal    Neuro/Psych Negative Neurological ROS     GI/Hepatic negative GI ROS, Neg liver ROS,   Endo/Other  Negative Endocrine ROS  Renal/GU negative Renal ROS     Musculoskeletal negative musculoskeletal ROS (+)   Abdominal   Peds  Hematology   Anesthesia Other Findings   Reproductive/Obstetrics negative OB ROS                          Anesthesia Physical Anesthesia Plan  ASA: I  Anesthesia Plan: General   Post-op Pain Management:    Induction: Intravenous  Airway Management Planned: LMA  Additional Equipment:   Intra-op Plan:   Post-operative Plan:   Informed Consent: I have reviewed the patients History and Physical, chart, labs and discussed the procedure including the risks, benefits and alternatives for the proposed anesthesia with the patient or authorized representative who has indicated his/her understanding and acceptance.   Dental advisory given  Plan Discussed with: CRNA and Surgeon  Anesthesia Plan Comments: (Plan routine monitors, GA- LMA OK)        Anesthesia Quick Evaluation

## 2011-01-01 NOTE — Op Note (Signed)
Preoperative diagnosis: Left breast atypical lobular hyperplasia on core biopsy with mammographic abnormality Postoperative diagnosis: Same as above  Procedure: Left breast wire localization biopsy Surgeon: Dr. Harden Mo Anesthesia: Gen. With LMA Specimens: Left breast tissue marked with paint kit Estimated blood loss: Minimal Complications: None Drains: None Sponge needle count was correct at end of operation Findings: Mammographic confirmation of removal of wire and clip  Indications: This is a 60 year old female who underwent a regular routine screening mammogram with a finding of left breast microcalcifications. Her biopsy showed atypical lobular hyperplasia. I discussed with she and her husband a wire localization biopsy to assure there is no cancer associated with this. The possibility of some observation was discussed. We decided on a wire localization biopsy and we discussed the risk and benefits associated with that.  Procedure: She first had a wire placed by Dr. Mayford Knife at the breast center. I discussed this with Dr. Mayford Knife prior to beginning. I had her mammograms available for my review in the operating room. She was taken to the operating room and was administered 1 g of intravenous cefazolin. Sequential compression devices were placed on her legs prior to beginning. She was then placed under general anesthesia with an LMA. Her left breast was prepped and draped in the standard sterile surgical fashion. Surgical timeout was performed  I then infiltrated quarter percent Marcaine throughout the surgical field. I then made a radial incision near her areola. I then used cautery to remove the wire and the surrounding tissue. A Faxitron mammogram was taken which confirmed removal of the lesion. I used a paint kit to mark the lesion. Hemostasis was then obtained. I closed this with 2-0 Vicryl 3-0 Vicryl and 4-0 Monocryl. I infiltrated additional 10 cc of quarter percent Marcaine.  Steri-Strips and a sterile dressing were placed. A breast binder was also placed. She tolerated this well and was extubated in the operating room.

## 2011-01-05 ENCOUNTER — Encounter (HOSPITAL_BASED_OUTPATIENT_CLINIC_OR_DEPARTMENT_OTHER): Payer: Self-pay | Admitting: General Surgery

## 2011-01-15 ENCOUNTER — Encounter (INDEPENDENT_AMBULATORY_CARE_PROVIDER_SITE_OTHER): Payer: Self-pay | Admitting: General Surgery

## 2011-01-18 ENCOUNTER — Encounter (INDEPENDENT_AMBULATORY_CARE_PROVIDER_SITE_OTHER): Payer: Self-pay | Admitting: General Surgery

## 2011-01-18 ENCOUNTER — Ambulatory Visit (INDEPENDENT_AMBULATORY_CARE_PROVIDER_SITE_OTHER): Payer: Private Health Insurance - Indemnity | Admitting: General Surgery

## 2011-01-18 DIAGNOSIS — Z09 Encounter for follow-up examination after completed treatment for conditions other than malignant neoplasm: Secondary | ICD-10-CM

## 2011-01-18 NOTE — Progress Notes (Signed)
Subjective:     Patient ID: Judy Spears, female   DOB: 04/12/1950, 60 y.o.   MRN: [id number]  HPI 35 yof s/p left breast biopsy for [facility]. Final path is all benign.  She has swelling left breast, no drainage, no fevers, has gotten better last 24 hours.  Comes in to discuss path as well as for postop exam  Review of Systems     Objective:   Physical Exam Left breast hematoma, soft, no infection, wound clean    Assessment:     S/p left breast biopsy    Plan:     Reassured that hematoma will resolve over time Discussed her pathology She will call if she needs anything

## 2011-01-29 ENCOUNTER — Encounter (INDEPENDENT_AMBULATORY_CARE_PROVIDER_SITE_OTHER): Payer: Self-pay | Admitting: General Surgery

## 2011-12-15 ENCOUNTER — Institutional Professional Consult (permissible substitution): Payer: Private Health Insurance - Indemnity | Admitting: Cardiology

## 2011-12-20 ENCOUNTER — Encounter: Payer: Self-pay | Admitting: *Deleted

## 2011-12-21 ENCOUNTER — Encounter: Payer: Self-pay | Admitting: Cardiology

## 2011-12-21 ENCOUNTER — Ambulatory Visit (INDEPENDENT_AMBULATORY_CARE_PROVIDER_SITE_OTHER): Payer: Private Health Insurance - Indemnity | Admitting: Cardiology

## 2011-12-21 VITALS — BP 110/60 | HR 72 | Ht 64.0 in | Wt 119.4 lb

## 2011-12-21 DIAGNOSIS — Z9189 Other specified personal risk factors, not elsewhere classified: Secondary | ICD-10-CM | POA: Insufficient documentation

## 2011-12-21 DIAGNOSIS — E785 Hyperlipidemia, unspecified: Secondary | ICD-10-CM

## 2011-12-21 DIAGNOSIS — E7800 Pure hypercholesterolemia, unspecified: Secondary | ICD-10-CM

## 2011-12-21 MED ORDER — ASPIRIN EC 81 MG PO TBEC: 81.0000 mg | DELAYED_RELEASE_TABLET | Freq: Every day | ORAL | Status: DC

## 2011-12-21 NOTE — Patient Instructions (Addendum)
Your physician recommends that you schedule a follow-up appointment in: AS NEEDED  START ASPIRIN 81 MG ONCE DAILY WITH FOOD  Your physician recommends that you return for lab work in: FASTING=LATE December  CONSIDER TAKING CO ENZYME Q-10 200 MG ONCE DAILY

## 2011-12-21 NOTE — Progress Notes (Signed)
Patient ID: Judy Spears, female   DOB: 1950-04-27, 61 y.o.   MRN: [id number] PCP: Dr. Corliss Blacker  61 yo with history of hyperlipidemia presents for cardiology evaluation.  She has been healthy in general throughout her life.  She is quite active.  She walks 4 miles briskly at a time and does vigorous aerobic exercise.  Some weight lifting.  No exertional dyspnea or chest pain/tightness/neck pain. The main concern today is elevated LDL coupled with a very high lipoprotein(a) level and a high LDL particle number.  She has other members of her family with elevated Lp(a).  Her mother had a stroke at age 58 that does not appear to have been atrial fibrillation-related.  Her grandfather had an MI at 49 and an uncle had CABG in his early 47s.  She recently started on atorvastatin 10 mg daily.  She does not take ASA.   ECG: NSR, normal  Labs (9/13): Lp(a) 200, TGs 58, HDL 68, LDL 148, LDL particle number 1772, K 4.3, creatinine 0.7  PMH: 1. Benign essential tremor 2. Hyperlipidemia 3. Migraine headaches  SH: Nonsmoker.  Occasional ETOH.  Married.   FH: Father with colon and prostate CA, mother with CVA at 52, grandfather with MI at 37, uncle with CABG at 46.   ROS: All systems reviewed and negative except as per HPI.   Current Outpatient Prescriptions  Medication Sig Dispense Refill  . ALPRAZolam (XANAX) 0.5 MG tablet Take 0.5 mg by mouth at bedtime as needed.       [name] atorvastatin (LIPITOR) 10 MG tablet Take 10 mg by mouth daily.      . calcium-vitamin D (OSCAL WITH D) 250-125 MG-UNIT per tablet Take 1 tablet by mouth daily.        . COMBIPATCH 0.05-0.14 MG/DAY Place 1 patch onto the skin 2 (two) times a week.       . finasteride (PROSCAR) 5 MG tablet Take 2.5 mg by mouth daily.      . Multiple Vitamin (MULTIVITAMIN) capsule Take 1 capsule by mouth daily.        . ondansetron (ZOFRAN) 8 MG tablet Take by mouth every 8 (eight) hours as needed.      . rizatriptan (MAXALT) 10 MG tablet Take 10 mg by  mouth as needed. May repeat in 2 hours if needed      . aspirin EC 81 MG tablet Take 1 tablet (81 mg total) by mouth daily.  90 tablet  3    BP 110/60  Pulse 72  Ht 5\' 4"  (1.626 m)  Wt 119 lb 6.4 oz (54.159 kg)  BMI 20.49 kg/m2  LMP 12/01/2010 General: NAD Neck: No JVD, no thyromegaly or thyroid nodule.  Lungs: Clear to auscultation bilaterally with normal respiratory effort. CV: Nondisplaced PMI.  Heart regular S1/S2, no S3/S4, no murmur.  No peripheral edema.  No carotid bruit.  Normal pedal pulses.  Abdomen: Soft, nontender, no hepatosplenomegaly, no distention.  Skin: Intact without lesions or rashes.  Neurologic: Alert and oriented x 3.  Psych: Normal affect. Extremities: No clubbing or cyanosis.  HEENT: Normal.   Assessment/Plan:  1. Hyperlipidemia: Patient actually has a fairly high risk lipid profile.  She has high LDL but not markedly elevated, and her HDL is high as well.  More of a concern is the very high Lp(a) level and the high LDL particle number.  Lp(a) elevation is often seen in families with premature vascular disease and is a marker of elevated risk.  It  is noteworthy that her mother had a large CVA at 61 and maternal GF had MI at 71.  In the absence of the Lp(a) and LDL particle number readings, she would be in a low risk cohort.  However, given her overall picture, I think that her risk of symptomatic vascular disease over the next 10 years would be > 7.5%.  Therefore, I would recommend that she take a moderate dose of statin (at least 20 mg atorvastatin).  She is currently on atorvastatin 10 mg daily and is tolerating this without a problem.  Will repeat lipids in mid-December.  I will talk further to her at that time about increasing her atorvastatin to 20 mg daily.  She can take coenzyme Q10 200 mg daily for possible protection against the muscular side effects from statins.  2. CAD risk: As above, I would put the patient at intermediate cardiovascular risk given her  markedly elevated Lp(a) and her elevated LDL particle number.  She should be on a statin as above.  I also recommended that she start ASA 81 mg daily.  This will be especially useful for primary stroke prevention (mother had CVA at 36).  I would not recommend stress testing given lack of exertional symptoms and excellent exercise tolerance.  We talked about coronary artery calcium scoring but will hold off on this for now because I do not think it would change the way that I will treat her at this time (ASA and statin).   Marca Ancona 12/21/2011

## 2012-01-10 ENCOUNTER — Other Ambulatory Visit (INDEPENDENT_AMBULATORY_CARE_PROVIDER_SITE_OTHER): Payer: Private Health Insurance - Indemnity

## 2012-01-10 DIAGNOSIS — E7800 Pure hypercholesterolemia, unspecified: Secondary | ICD-10-CM

## 2012-01-10 LAB — LIPID PANEL
Cholesterol: 165 mg/dL (ref 0–200)
HDL: 60.9 mg/dL (ref >39.00)
VLDL: 13 mg/dL (ref 0.0–40.0)

## 2012-01-10 LAB — HEPATIC FUNCTION PANEL: Albumin: 4.4 g/dL (ref 3.5–5.2)

## 2012-01-11 LAB — LIPOPROTEIN A (LPA): Lipoprotein (a): 56 mg/dL — ABNORMAL HIGH (ref 0–30)

## 2012-01-14 ENCOUNTER — Telehealth: Payer: Self-pay | Admitting: *Deleted

## 2012-01-14 DIAGNOSIS — E785 Hyperlipidemia, unspecified: Secondary | ICD-10-CM

## 2012-01-14 NOTE — Telephone Encounter (Signed)
Spoke with [name] for pt. Fasting lab appt scheduled for 04/11/12

## 2012-01-14 NOTE — Telephone Encounter (Signed)
01/14/12 LMTCB for pt.    Laurey Morale, MD   Thu January 13, 2012 10:00 AM        Judy Spears    MRN: [id number] DOB: [birth year]    Pt Home: [phone]       Please arrange for LFTs, lipids, and Lp(a) on Judy Spears in 3 months.

## 2012-03-14 ENCOUNTER — Telehealth: Payer: Self-pay | Admitting: Cardiology

## 2012-03-14 NOTE — Telephone Encounter (Signed)
The Assistant  Osie Cheeks) for Joanna Hews Called asking For Labs,Med List to be Sent to  [employer] [employer] every time she's in the Office, I let her Know a signed ROI Need to be Completed, she [facility] I faxed One over to [phone] For [name] to Sign.

## 2012-04-11 ENCOUNTER — Other Ambulatory Visit: Payer: Private Health Insurance - Indemnity

## 2012-04-12 ENCOUNTER — Other Ambulatory Visit: Payer: Private Health Insurance - Indemnity

## 2012-04-21 ENCOUNTER — Other Ambulatory Visit: Payer: Private Health Insurance - Indemnity

## 2012-04-26 ENCOUNTER — Encounter: Payer: Self-pay | Admitting: Cardiology

## 2012-04-28 ENCOUNTER — Other Ambulatory Visit: Payer: Private Health Insurance - Indemnity

## 2012-05-11 ENCOUNTER — Telehealth: Payer: Self-pay | Admitting: *Deleted

## 2012-05-11 DIAGNOSIS — E7800 Pure hypercholesterolemia, unspecified: Secondary | ICD-10-CM

## 2012-05-11 MED ORDER — ATORVASTATIN CALCIUM 10 MG PO TABS: 10.0000 mg | ORAL_TABLET | Freq: Every day | ORAL | Status: DC

## 2012-05-11 NOTE — Telephone Encounter (Signed)
Lab results reviewed by dr Shirlee Latch, refill for lipitor 10 mg sent to pharm. She will return for a lipomed profile in November.

## 2012-07-12 ENCOUNTER — Other Ambulatory Visit: Payer: Self-pay | Admitting: Gynecology

## 2012-07-12 DIAGNOSIS — N63 Unspecified lump in unspecified breast: Secondary | ICD-10-CM

## 2012-07-27 ENCOUNTER — Ambulatory Visit
Admission: RE | Admit: 2012-07-27 | Discharge: 2012-07-27 | Disposition: A | Discharge status: Home / Self Care | Payer: BC Managed Care – PPO | Source: Ambulatory Visit | Attending: Gynecology | Admitting: Gynecology

## 2012-07-27 ENCOUNTER — Other Ambulatory Visit: Payer: Self-pay | Admitting: Gynecology

## 2012-07-27 ENCOUNTER — Other Ambulatory Visit (HOSPITAL_COMMUNITY): Payer: Self-pay | Admitting: Diagnostic Radiology

## 2012-07-27 DIAGNOSIS — R921 Mammographic calcification found on diagnostic imaging of breast: Secondary | ICD-10-CM

## 2012-07-27 DIAGNOSIS — N63 Unspecified lump in unspecified breast: Secondary | ICD-10-CM

## 2012-07-27 HISTORY — PX: BREAST BIOPSY: SHX20

## 2013-02-16 ENCOUNTER — Other Ambulatory Visit: Payer: Self-pay | Admitting: Gynecology

## 2013-02-16 DIAGNOSIS — R921 Mammographic calcification found on diagnostic imaging of breast: Secondary | ICD-10-CM

## 2013-05-16 ENCOUNTER — Telehealth: Payer: Self-pay | Admitting: Internal Medicine

## 2013-05-16 NOTE — Telephone Encounter (Signed)
I have spoken to Randolm Idol- she wants me to review her father's records- Jacquenette Shone, lives in [place], They will fax records to [phone], attn Dr Delfin Edis

## 2013-06-04 ENCOUNTER — Ambulatory Visit
Admission: RE | Admit: 2013-06-04 | Discharge: 2013-06-04 | Disposition: A | Discharge status: Home / Self Care | Payer: BC Managed Care – PPO | Source: Ambulatory Visit | Attending: Gynecology | Admitting: Gynecology

## 2013-06-04 ENCOUNTER — Inpatient Hospital Stay: Admission: RE | Admit: 2013-06-04 | Payer: BC Managed Care – PPO | Source: Ambulatory Visit

## 2013-06-04 ENCOUNTER — Other Ambulatory Visit: Payer: Self-pay | Admitting: Gynecology

## 2013-06-04 DIAGNOSIS — N63 Unspecified lump in unspecified breast: Secondary | ICD-10-CM

## 2013-06-11 NOTE — Telephone Encounter (Signed)
Per Dr. Olevia Perches, Patient is Judy Spears DOB [birth year]. He is from [place], [place]. She will see him on June 18th at 1:00 PM.

## 2013-06-11 NOTE — Telephone Encounter (Signed)
Per Carla Drape, route to Dr. Olevia Perches.  Records were being reviewed by Dr. Olevia Perches this weekend.

## 2013-06-11 NOTE — Telephone Encounter (Signed)
Spoke with Ms. Balthaser and gave her the appointment for her father.

## 2013-11-14 ENCOUNTER — Other Ambulatory Visit: Payer: Self-pay | Admitting: Dermatology

## 2013-12-04 ENCOUNTER — Ambulatory Visit (INDEPENDENT_AMBULATORY_CARE_PROVIDER_SITE_OTHER): Payer: BC Managed Care – PPO | Admitting: Sports Medicine

## 2013-12-04 ENCOUNTER — Encounter: Payer: Self-pay | Admitting: Sports Medicine

## 2013-12-04 VITALS — BP 107/82 | Ht 64.0 in | Wt 120.0 lb

## 2013-12-04 DIAGNOSIS — M2022 Hallux rigidus, left foot: Secondary | ICD-10-CM

## 2013-12-04 DIAGNOSIS — M7742 Metatarsalgia, left foot: Secondary | ICD-10-CM | POA: Diagnosis not present

## 2013-12-04 DIAGNOSIS — R269 Unspecified abnormalities of gait and mobility: Secondary | ICD-10-CM | POA: Diagnosis not present

## 2013-12-04 DIAGNOSIS — M7741 Metatarsalgia, right foot: Secondary | ICD-10-CM

## 2013-12-04 NOTE — Patient Instructions (Signed)
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  This means the toe movement is less than necessary for efficient walking. Probably from some arthritis in the toe from past activity - dance.  Try bunion shield Spacers Foam cushion Any that help symptoms are OK to use  For arthritis on hands and great toe try: Arnica gel or cream Tumeric capsules Capsin cream is made from red peppers and helps a lot of people but will burn if you get in on eyes or lips. We have prescription anti-inflammatory creams we could try as well  We will try to protect the great toe by changes on your orthotics  I hope these last a long time as well but if the orthotics seem not to be helping we can make adjustments

## 2013-12-04 NOTE — Progress Notes (Signed)
  Judy Spears - 63 y.o. female MRN [id number]  Date of birth: [birth year]  SUBJECTIVE:  Including CC & ROS.  Patient presents today for new orthotics and gait analysis.  Patient reports improvement in her metatarsal pain since placing metatarsal pad in most of her shoes.  Currently c/o of pain and curving of her right great toe laterally.  This pain is worse with activities such as toeing up and particular painful when practicing yoga.  She has not taken any medication, but she has changed her shoes.    ROS: Review of systems otherwise negative except for information present in HPI  HISTORY: Past Medical, Surgical, Social, and Family History Reviewed & Updated per EMR. Pertinent Historical Findings include: No smoker No DM type 2  PHYSICAL EXAM:  VS: BP:107/82 mmHg  HR: bpm  TEMP: ( )  RESP:   HT:5\' 4"  (162.6 cm)   WT:120 lb (54.432 kg)  BMI:20.6 FOOT EXAM:  General: well nourished Skin of LE: warm; dry, no rashes, lesions, ecchymosis or erythema. Vascular: Dorsal pedal pulses 2+ bilaterally Neurologically: Sensation to light touch lower extremities equal and intact bilaterally.  Observation - no ecchymosis, no edema, or hematoma present  Palpation: TTP over the 1st MTP joint Normal ankle motion and strength bilaterally  Extension/flexion 5/5 strength bilaterally in toes Weight-bearing foot exam:  First ray: Hallux rigidus and hallux valgus  Second ray: longer than the first Longitudinal arch: loss of medial arch Heel position: neurtral  ASSESSMENT & PLAN: See problem based charting & AVS for pt instructions. Orthotic Fitting and Adjustment note: Patient was fitted for a : standard, cushioned, semi-rigid orthotic.  The orthotic was heated and afterward the patient stood on the orthotic blank positioned on the orthotic stand.  The patient was positioned in subtalar neutral position and 10 degrees of ankle dorsiflexion in a weight bearing stance.  After completion of molding, a  stable base was applied to the orthotic blank.  The blank was ground to a stable position for weight bearing.  Size: 7  Base: Blue EVA Posting: first and second ray poster Additional orthotic padding: bilateral metatarsal pads Patient also provided with pad for 1st ray bunion, also recommend 2-3 toe tapping to present 2nd toe hammering  Greater than 50% of the patient's visit for a total of 35 minutes, was spent conducting face-to-face counseling for bilateral foot orthotics fitting and constructing

## 2014-01-10 ENCOUNTER — Other Ambulatory Visit: Payer: Self-pay | Admitting: Gynecology

## 2014-01-10 ENCOUNTER — Telehealth: Payer: Self-pay | Admitting: Cardiology

## 2014-01-10 NOTE — Telephone Encounter (Signed)
New Message  Pt has recently had Chest tightness couple days ago(middle of night, woke her up) and was told by her [facility] (Dr. Carren Rang)  that she needed a stress test.  Pt opted to see Dr. Aundra Dubin 12/18 @ 4:00 (earliest appt). Pt requests to speak with her nurse about her current condition. Please call back and discuss.

## 2014-01-10 NOTE — Telephone Encounter (Signed)
Pt states she woke up several nights ago in the middle of the night with chest pain.  She went back to sleep and has not had any chest pain since that time.  She has exercised since then without any symptoms. She saw her GYN today and he suggested she have a stress test.  Pt advised to keep appt with Dr Aundra Dubin tomorrow and will order necessary testing  after appt with Dr Aundra Dubin.

## 2014-01-11 ENCOUNTER — Encounter: Payer: Self-pay | Admitting: Cardiology

## 2014-01-11 ENCOUNTER — Ambulatory Visit (INDEPENDENT_AMBULATORY_CARE_PROVIDER_SITE_OTHER): Payer: BC Managed Care – PPO | Admitting: Cardiology

## 2014-01-11 VITALS — BP 116/80 | HR 70 | Ht 64.0 in | Wt 120.8 lb

## 2014-01-11 DIAGNOSIS — E785 Hyperlipidemia, unspecified: Secondary | ICD-10-CM

## 2014-01-11 DIAGNOSIS — I491 Atrial premature depolarization: Secondary | ICD-10-CM

## 2014-01-11 DIAGNOSIS — R002 Palpitations: Secondary | ICD-10-CM

## 2014-01-11 NOTE — Patient Instructions (Addendum)
Your physician recommends that you return for a FASTING lipomed profile/LPa/TSH Monday December 21,2015. The lab is open from 7:30AM-5PM.   Your physician has recommended that you wear a holter monitor. Holter monitors are medical devices that record the heart's electrical activity. Doctors most often use these monitors to diagnose arrhythmias. Arrhythmias are problems with the speed or rhythm of the heartbeat. The monitor is a small, portable device. You can wear one while you do your normal daily activities. This is usually used to diagnose what is causing palpitations/syncope (passing out). 64 HOUR  Your physician wants you to follow-up in: 1 year with Dr Aundra Dubin. (December 2016).You will receive a reminder letter in the mail two months in advance. If you don't receive a letter, please call our office to schedule the follow-up appointment.

## 2014-01-11 NOTE — Progress Notes (Signed)
Exercise Treadmill Test  Pre-Exercise Testing Evaluation Rhythm: normal sinus  Rate: 73 bpm     Test  Exercise Tolerance Test Ordering MD: Loralie Champagne, MD  Interpreting MD: Loralie Champagne, MD  Unique Test No: 1  Treadmill:  1  Indication for ETT: chest pain - rule out ischemia  Contraindication to ETT: No   Stress Modality: exercise - treadmill  Cardiac Imaging Performed: non   Protocol: standard Bruce - maximal  Max BP:  198/97  Max MPHR (bpm):  158 85% MPR (bpm):  134  MPHR obtained (bpm):  162 % MPHR obtained:  102  Reached 85% MPHR (min:sec):  7:07 Total Exercise Time (min-sec):  12:00  Workload in METS:  13.4 METS Borg Scale: 17  Reason ETT Terminated:  Fatigue    ST Segment Analysis At Rest: normal ST segments - no evidence of significant ST depression With Exercise: no evidence of significant ST depression  Other Information Arrhythmia:  Yes, PACs Angina during ETT:  absent (0) Quality of ETT:  diagnostic  ETT Interpretation:  normal - no evidence of ischemia by ST analysis  Comments: PACs noted.

## 2014-01-13 DIAGNOSIS — I491 Atrial premature depolarization: Secondary | ICD-10-CM | POA: Insufficient documentation

## 2014-01-13 NOTE — Progress Notes (Signed)
Patient ID: Judy Spears, female   DOB: 21-Dec-1950, 63 y.o.   MRN: [id number] PCP: Dr. Addison Lank  63 yo with history of hyperlipidemia presents for cardiology evaluation.  She has been healthy in general throughout her life.  She is quite active.  She walks 4 miles briskly at a time and does vigorous aerobic exercise.  Some weight lifting.  No exertional dyspnea or chest pain/tightness/neck pain. She has had elevated LDL coupled with a very high lipoprotein(a) level and a high LDL particle number.  She has other members of her family with elevated Lp(a).  Her mother had a stroke at age 52 that does not appear to have been atrial fibrillation-related.  Her grandfather had an MI at 53 and an uncle had CABG in his early 40s.  She is on atorvastatin 10 mg daily.  She does not take ASA.   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  She developed a severe URI with cough/congestion/hoarseness that lasted several days.  She was very fatigued when on a 3 mile walk during this time.  It took her 2-3 weeks to really get over it.  Earlier this week, she woke up once with chest tightness (mild) that lasted about 20 minutes.  She saw her Ob/gyn earlier this week and an irregular pulse was noted.  This triggered today's visit.  She does not feel any palpitations at all.  No lightheadedness, syncope.   ECG: NSR, PACs otherwise normal.   Labs (9/13): Lp(a) 200, TGs 58, HDL 68, LDL 148, LDL particle number 1772, K 4.3, creatinine 0.7  PMH: 1. Benign essential tremor 2. Hyperlipidemia 3. Migraine headaches 4. PACs  SH: Nonsmoker.  Occasional ETOH.  Married.   FH: Father with colon and prostate CA, mother with CVA at 51, grandfather with MI at 77, uncle with CABG at 50.   ROS: All systems reviewed and negative except as per HPI.   Current Outpatient Prescriptions  Medication Sig Dispense Refill  . ALPRAZolam (XANAX) 0.5 MG tablet Take 0.5 mg by mouth at bedtime as needed. 1/4 a tab prn     . atorvastatin (LIPITOR) 10 MG tablet Take 1 tablet (10 mg total) by mouth daily. 90 tablet 4  . Azelastine HCl 0.15 % SOLN Place into the nose as needed.     . fexofenadine (ALLEGRA) 60 MG tablet Take 60 mg by mouth 2 (two) times daily.     . Melatonin 3 MG TABS Take 3 mg by mouth daily.    . Multiple Vitamin (MULTIVITAMIN) capsule Take 1 capsule by mouth daily.      . SUMAtriptan (IMITREX) 25 MG tablet Take 25 mg by mouth as needed for migraine or headache. Patient unsure of dosage     No current facility-administered medications for this visit.    BP 116/80 mmHg  Pulse 70  Ht 5\' 4"  (1.626 m)  Wt 120 lb 12.8 oz (54.795 kg)  BMI 20.73 kg/m2  LMP 12/01/2010 General: NAD Neck: No JVD, no thyromegaly or thyroid nodule.  Lungs: Clear to auscultation bilaterally with normal respiratory effort. CV: Nondisplaced PMI.  Heart regular S1/S2, no S3/S4, no murmur.  No peripheral edema.  No carotid bruit.  Normal pedal pulses.  Abdomen: Soft, nontender, no hepatosplenomegaly, no distention.  Skin: Intact without lesions or rashes.  Neurologic: Alert and oriented x 3.  Psych: Normal affect. Extremities: No clubbing or cyanosis.  HEENT: Normal.   Assessment/Plan:  1. Hyperlipidemia: Patient has  had a fairly high risk lipid profile.  She had high LDL but not markedly elevated, and her HDL was high as well.  More of a concern was the very high Lp(a) level and the high LDL particle number.  Lp(a) elevation is often seen in families with premature vascular disease and is a marker of elevated risk.  It is noteworthy that her mother had a large CVA at 80 and maternal GF had MI at 62.  In the absence of the Lp(a) and LDL particle number readings, she would be in a low risk cohort. However, given her overall picture, I think that her risk of symptomatic vascular disease over the next 10 years would be > 7.5%.  Therefore, I started her on a statin.  - Continue atorvastatin.  - I will have her get a  fasting Lp(a) and Lipomed profile. 2. PACs: Patient has asymptomatic PACs.  I suspect this is what her Ob/gyn noted on exam.  PACs may be a product of stress.  She does not have exertional dyspnea or chest pain.  No known family history of atrial fibrillation.  - I will arrange for ETT to assess for ischemia and to assess PAC burden during exercise.  - I will arrange for holter monitor x 24 hours to assess full PAC burden and to look for any long runs or atrial fibrillation.   Loralie Champagne 01/13/2014

## 2014-01-14 ENCOUNTER — Encounter: Payer: Self-pay | Admitting: Nurse Practitioner

## 2014-01-14 ENCOUNTER — Other Ambulatory Visit (INDEPENDENT_AMBULATORY_CARE_PROVIDER_SITE_OTHER): Payer: BC Managed Care – PPO | Admitting: *Deleted

## 2014-01-14 ENCOUNTER — Ambulatory Visit (INDEPENDENT_AMBULATORY_CARE_PROVIDER_SITE_OTHER): Payer: BC Managed Care – PPO | Admitting: *Deleted

## 2014-01-14 ENCOUNTER — Encounter: Payer: Self-pay | Admitting: *Deleted

## 2014-01-14 ENCOUNTER — Other Ambulatory Visit: Payer: BC Managed Care – PPO

## 2014-01-14 ENCOUNTER — Ambulatory Visit (INDEPENDENT_AMBULATORY_CARE_PROVIDER_SITE_OTHER): Payer: BC Managed Care – PPO | Admitting: Nurse Practitioner

## 2014-01-14 ENCOUNTER — Other Ambulatory Visit: Payer: Self-pay | Admitting: Cardiology

## 2014-01-14 VITALS — BP 114/74 | HR 64

## 2014-01-14 DIAGNOSIS — E785 Hyperlipidemia, unspecified: Secondary | ICD-10-CM

## 2014-01-14 DIAGNOSIS — R002 Palpitations: Secondary | ICD-10-CM

## 2014-01-14 DIAGNOSIS — I491 Atrial premature depolarization: Secondary | ICD-10-CM

## 2014-01-14 DIAGNOSIS — E038 Other specified hypothyroidism: Secondary | ICD-10-CM

## 2014-01-14 DIAGNOSIS — R079 Chest pain, unspecified: Secondary | ICD-10-CM

## 2014-01-14 LAB — CYTOLOGY - PAP

## 2014-01-14 MED ORDER — PANTOPRAZOLE SODIUM 40 MG PO TBEC: 40.0000 mg | DELAYED_RELEASE_TABLET | Freq: Every day | ORAL | Status: DC

## 2014-01-14 MED ORDER — NITROGLYCERIN 0.4 MG SL SUBL: 0.4000 mg | SUBLINGUAL_TABLET | SUBLINGUAL | Status: DC | PRN

## 2014-01-14 NOTE — Progress Notes (Signed)
Judy Spears Date of Birth: 1950/02/07 Medical Record #[id number]  History of Present Illness: Judy Spears is seen back today for a work in visit. Seen for Dr. Aundra Dubin. She is a 62 year old female with HLD. Just seen here on Friday for cardiology evaluation and had GXT. Walked 12 minutes on standard Bruce protocol. No EKG changes. Noted to have PACs.  Was here to get her heart monitor placed - told the staff that she had had chest pain over the weekend. Lots of stress. She notes a sensation of chest pressure that comes and goes - not exertional in nature. Lasts for a few minutes. Worse at night and with lying down. Did yoga last night and felt fine with no symptoms. No associated symptoms. She does not have palpitations. ?GERD. Anxious.   FH + for CAD - her brother has had CAD at age 64. Maternal grandfather with MI at 26.   Current Outpatient Prescriptions  Medication Sig Dispense Refill  . ALPRAZolam (XANAX) 0.5 MG tablet Take 0.5 mg by mouth at bedtime as needed. 1/4 a tab prn    . aspirin EC 81 MG tablet Take 1 tablet (81 mg total) by mouth daily.    [name] atorvastatin (LIPITOR) 10 MG tablet Take 1 tablet (10 mg total) by mouth daily. 90 tablet 4  . Azelastine HCl 0.15 % SOLN Place into the nose as needed.     . fexofenadine (ALLEGRA) 60 MG tablet Take 60 mg by mouth 2 (two) times daily.     . Melatonin 3 MG TABS Take 3 mg by mouth daily.    . Multiple Vitamin (MULTIVITAMIN) capsule Take 1 capsule by mouth daily.      . nitroGLYCERIN (NITROSTAT) 0.4 MG SL tablet Place 1 tablet (0.4 mg total) under the tongue every 5 (five) minutes as needed for chest pain. 100 tablet 3  . pantoprazole (PROTONIX) 40 MG tablet Take 1 tablet (40 mg total) by mouth daily. 30 tablet 0  . SUMAtriptan (IMITREX) 25 MG tablet Take 25 mg by mouth as needed for migraine or headache. Patient unsure of dosage     No current facility-administered medications for this visit.    Allergies  Allergen Reactions  .  Tetracyclines & Related     Blisters, photosensitivity    Past Medical History  Diagnosis Date  . Complication of anesthesia     pt has had headaches post op-did advise to hydra well preop  . No pertinent past medical history   . Arthritis     lt great toe  . Insomnia   . Hair loss     right-sided  . Postmenopausal   . Benign essential tremor     right hand / arm   . Hyperlipidemia   . Migraine headache     Past Surgical History  Procedure Laterality Date  . Hernia repair  2000    [facility]  . Rhinoplasty  1983  . Cesarean section  1986  . Breast biopsy  01/01/2011    Procedure: BREAST BIOPSY WITH NEEDLE LOCALIZATION;  Surgeon: Rolm Bookbinder, MD;  Location: [facility];  Service: General;  Laterality: Left;  left breast wire localization biopsy    History  Smoking status  . Never Smoker   Smokeless tobacco  . Never Used    History  Alcohol Use  . Yes    Family History  Problem Relation Age of Onset  . Stroke Mother     paralyzed on right  side/aphasia  . Hypertension Mother     had stroke after stopping BP meds  . Colon cancer Father   . Prostate cancer Father   . Heart attack Maternal Grandfather 56  . Stroke Maternal Uncle 50    Review of Systems: The review of systems is per the HPI.  All other systems were reviewed and are negative.  Physical Exam: LMP 12/01/2010 Patient is very pleasant and in no acute distress. Skin is warm and dry. Color is normal.  HEENT is unremarkable. Normocephalic/atraumatic. PERRL. Sclera are nonicteric. Neck is supple. No masses. No JVD. Lungs are clear. Cardiac exam shows a regular rate and rhythm. Abdomen is soft. Extremities are without edema. Gait and ROM are intact. No gross neurologic deficits noted.  Wt Readings from Last 3 Encounters:  01/11/14 120 lb 12.8 oz (54.795 kg)  12/04/13 120 lb (54.432 kg)  12/21/11 119 lb 6.4 oz (54.159 kg)    LABORATORY DATA/PROCEDURES:  EKG today with NSR and PACs  GXT  from Friday reviewed - noted to walk 12 minutes with no EKG changes.   Lab Results  Component Value Date   WBC 5.1 01/01/2011   HGB 12.9 01/01/2011   HCT 38.4 01/01/2011   PLT 272 01/01/2011   GLUCOSE 92 01/01/2011   CHOL 165 01/10/2012   TRIG 65.0 01/10/2012   HDL 60.90 01/10/2012   LDLCALC 91 01/10/2012   ALT 27 01/10/2012   AST 34 01/10/2012   NA 137 01/01/2011   K 3.8 01/01/2011   CL 101 01/01/2011   CREATININE 0.76 01/01/2011   BUN 10 01/01/2011   CO2 27 01/01/2011    BNP (last 3 results) No results for input(s): PROBNP in the last 8760 hours.   Assessment / Plan: 1. Chest pain -  Recent - GXT. EKG today not acute. Would place on aspirin 81 mg a day. Proceed with the heart monitor. NTG prn. Add PPI therapy as well to not have confusing symptoms.   2. PACs - has her monitor on. May need to consider beta blocker. Will get her back to see Dr. Aundra Dubin for discussion. Instructed to limit caffeine and alcohol.  3. HLD - on statin therapy  Will get her back to see Dr. Aundra Dubin for discussion. She is having her labs checked today.   Patient is agreeable to this plan and will call if any problems develop in the interim.   Burtis Junes, RN, [facility] [address] [place], [place]  [phone]

## 2014-01-14 NOTE — Progress Notes (Signed)
Patient ID: Judy Spears, female   DOB: 09-Apr-1950, 63 y.o.   MRN: [id number] [employer] 24 hour holter monitor applied to patient.

## 2014-01-14 NOTE — Addendum Note (Signed)
Addended by: Katrine Coho on: 01/14/2014 11:34 AM   Modules accepted: Orders

## 2014-01-14 NOTE — Patient Instructions (Signed)
Avoid caffeine and alcohol.  Start aspirin  81mg  daily.  Use protonix 40mg  daily for reflux.  Use nitroglycerin as needed for chest pain.   You have a  follow-up appointment with Dr Aundra Dubin Thursday January 7,2016 at [facility].      Nitroglycerin sublingual tablets What is this medicine? NITROGLYCERIN (nye troe GLI ser in) is a type of vasodilator. It relaxes blood vessels, increasing the blood and oxygen supply to your heart. This medicine is used to relieve chest pain caused by angina. It is also used to prevent chest pain before activities like climbing stairs, going outdoors in cold weather, or sexual activity. This medicine may be used for other purposes; ask your health care provider or pharmacist if you have questions. COMMON BRAND NAME(S): Nitroquick, Nitrostat, Nitrotab What should I tell my health care provider before I take this medicine? They need to know if you have any of these conditions: -anemia -head injury, recent stroke, or bleeding in the brain -liver disease -previous heart attack -an unusual or allergic reaction to nitroglycerin, other medicines, foods, dyes, or preservatives -pregnant or trying to get pregnant -breast-feeding How should I use this medicine? Take this medicine by mouth as needed. At the first sign of an angina attack (chest pain or tightness) place one tablet under your tongue. You can also take this medicine 5 to 10 minutes before an event likely to produce chest pain. Follow the directions on the prescription label. Let the tablet dissolve under the tongue. Do not swallow whole. Replace the dose if you accidentally swallow it. It will help if your mouth is not dry. Saliva around the tablet will help it to dissolve more quickly. Do not eat or drink, smoke or chew tobacco while a tablet is dissolving. If you are not better within 5 minutes after taking ONE dose of nitroglycerin, call 9-1-1 immediately to seek emergency medical care. Do not take more than 3  nitroglycerin tablets over 15 minutes. If you take this medicine often to relieve symptoms of angina, your doctor or health care professional may provide you with different instructions to manage your symptoms. If symptoms do not go away after following these instructions, it is important to call 9-1-1 immediately. Do not take more than 3 nitroglycerin tablets over 15 minutes. Talk to your pediatrician regarding the use of this medicine in children. Special care may be needed. Overdosage: If you think you have taken too much of this medicine contact a poison control center or emergency room at once. NOTE: This medicine is only for you. Do not share this medicine with others. What if I miss a dose? This does not apply. This medicine is only used as needed. What may interact with this medicine? Do not take this medicine with any of the following medications: -certain migraine medicines like ergotamine and dihydroergotamine (DHE) -medicines used to treat erectile dysfunction like sildenafil, tadalafil, and vardenafil -riociguat This medicine may also interact with the following medications: -alteplase -aspirin -heparin -medicines for high blood pressure -medicines for mental depression -other medicines used to treat angina -phenothiazines like chlorpromazine, mesoridazine, prochlorperazine, thioridazine This list may not describe all possible interactions. Give your health care provider a list of all the medicines, herbs, non-prescription drugs, or dietary supplements you use. Also tell them if you smoke, drink alcohol, or use illegal drugs. Some items may interact with your medicine. What should I watch for while using this medicine? Tell your doctor or health care professional if you feel your medicine is no  longer working. Keep this medicine with you at all times. Sit or lie down when you take your medicine to prevent falling if you feel dizzy or faint after using it. Try to remain calm. This  will help you to feel better faster. If you feel dizzy, take several deep breaths and lie down with your feet propped up, or bend forward with your head resting between your knees. You may get drowsy or dizzy. Do not drive, use machinery, or do anything that needs mental alertness until you know how this drug affects you. Do not stand or sit up quickly, especially if you are an older patient. This reduces the risk of dizzy or fainting spells. Alcohol can make you more drowsy and dizzy. Avoid alcoholic drinks. Do not treat yourself for coughs, colds, or pain while you are taking this medicine without asking your doctor or health care professional for advice. Some ingredients may increase your blood pressure. What side effects may I notice from receiving this medicine? Side effects that you should report to your doctor or health care professional as soon as possible: -blurred vision -dry mouth -skin rash -sweating -the feeling of extreme pressure in the head -unusually weak or tired Side effects that usually do not require medical attention (report to your doctor or health care professional if they continue or are bothersome): -flushing of the face or neck -headache -irregular heartbeat, palpitations -nausea, vomiting This list may not describe all possible side effects. Call your doctor for medical advice about side effects. You may report side effects to FDA at 1-800-FDA-1088. Where should I keep my medicine? Keep out of the reach of children. Store at room temperature between 20 and 25 degrees C (68 and 77 degrees F). Store in [employer]. Protect from light and moisture. Keep tightly closed. Throw away any unused medicine after the expiration date. NOTE: This sheet is a summary. It may not cover all possible information. If you have questions about this medicine, talk to your doctor, pharmacist, or health care provider.  2015, Elsevier/Gold Standard. (2012-11-09 17:57:36)

## 2014-01-15 LAB — TSH: TSH: 1.55 u[IU]/mL (ref 0.350–4.500)

## 2014-01-15 LAB — LIPOPROTEIN A (LPA): LIPOPROTEIN (A): 75 mg/dL — AB (ref 0–30)

## 2014-01-16 LAB — NMR LIPOPROFILE WITH LIPIDS
CHOLESTEROL, TOTAL: 194 mg/dL (ref 100–199)
HDL Particle Number: 34.6 umol/L (ref >=30.5)
HDL Size: 9.7 nm (ref >=9.2)
HDL-C: 70 mg/dL (ref >39)
LDL (calc): 107 mg/dL — ABNORMAL HIGH (ref 0–99)
LDL Particle Number: 1119 nmol/L — ABNORMAL HIGH (ref <1000)
LDL Size: 21.4 nm (ref >=20.8)
LP-IR Score: <25 (ref <=45)
Large HDL-P: 11.7 umol/L (ref >=4.8)
Small LDL Particle Number: 192 nmol/L (ref <=527)
TRIGLYCERIDES: 83 mg/dL (ref 0–149)
VLDL Size: 35 nm (ref <=46.6)

## 2014-01-19 ENCOUNTER — Encounter (HOSPITAL_COMMUNITY): Payer: Self-pay | Admitting: Emergency Medicine

## 2014-01-19 ENCOUNTER — Emergency Department (HOSPITAL_COMMUNITY)
Admission: EM | Admit: 2014-01-19 | Discharge: 2014-01-19 | Disposition: A | Discharge status: Home / Self Care | Payer: BC Managed Care – PPO | Source: Home / Self Care | Attending: Emergency Medicine | Admitting: Emergency Medicine

## 2014-01-19 DIAGNOSIS — J209 Acute bronchitis, unspecified: Secondary | ICD-10-CM

## 2014-01-19 DIAGNOSIS — J019 Acute sinusitis, unspecified: Secondary | ICD-10-CM

## 2014-01-19 MED ORDER — IPRATROPIUM BROMIDE 0.06 % NA SOLN: 2.0000 | Freq: Four times a day (QID) | NASAL | Status: DC

## 2014-01-19 MED ORDER — PREDNISONE 20 MG PO TABS: 20.0000 mg | ORAL_TABLET | Freq: Two times a day (BID) | ORAL | Status: DC

## 2014-01-19 MED ORDER — AMOXICILLIN-POT CLAVULANATE 875-125 MG PO TABS: 1.0000 | ORAL_TABLET | Freq: Two times a day (BID) | ORAL | Status: DC

## 2014-01-19 MED ORDER — HYDROCOD POLST-CHLORPHEN POLST 10-8 MG/5ML PO LQCR: 5.0000 mL | Freq: Two times a day (BID) | ORAL | Status: DC | PRN

## 2014-01-19 NOTE — ED Notes (Signed)
Pt states that she has had chest congestion and loss of voice intermittently since November. Pt is in no acute distress at this time.

## 2014-01-19 NOTE — ED Provider Notes (Signed)
Chief Complaint   Nasal Congestion and Cough   History of Present Illness   KLARYSSA FAUTH is a 63 year old female who has been sick since around Thanksgiving, for the past month. She seen her primary care physician about 3 times, but has not had a chest x-ray has not been prescribed any medications other than symptom medication. She relates a history of tiredness and fatigue, hoarseness and sore throat, nocturnal cough productive yellow-green sputum, aching in her rib cage, nasal congestion, ear congestion, and loose stools. She denies any fever or chills and has had no difficulty breathing or wheezing.  Review of Systems   Other than as noted above, the patient denies any of the following symptoms: Systemic:  No fevers, chills, sweats, or myalgias. Eye:  No redness or discharge. ENT:  No ear pain, headache, nasal congestion, drainage, sinus pressure, or sore throat. Neck:  No neck pain, stiffness, or swollen glands. Lungs:  No cough, sputum production, hemoptysis, wheezing, chest tightness, shortness of breath or chest pain. GI:  No abdominal pain, nausea, vomiting or diarrhea.  [place]   Past medical history, family history, social history, meds, and allergies were reviewed. She is allergic to tetracycline. She has a history of an irregular heartbeat.  Physical exam   Vital signs:  BP 126/84 mmHg  Pulse 74  Temp(Src) 98.1 F (36.7 C) (Oral)  Resp 16  SpO2 97%  LMP 12/01/2010 General:  Alert and oriented.  In no distress.  Skin warm and dry. Eye:  No conjunctival injection or drainage. Lids were normal. ENT:  TMs and canals were normal, without erythema or inflammation.  Nasal mucosa was congested with crusted yellow drainage.  Mucous membranes were moist.  Pharynx was clear with no exudate or drainage.  There were no oral ulcerations or lesions. Neck:  Supple, no adenopathy, tenderness or mass. Lungs:  No respiratory distress.  Lungs were clear to auscultation, without wheezes,  rales or rhonchi.  Breath sounds were clear and equal bilaterally.  Heart:  Regular rhythm, without gallops, murmers or rubs. Skin:  Clear, warm, and dry, without rash or lesions.  Assessment     The primary encounter diagnosis was Acute sinusitis, recurrence not specified, unspecified location. A diagnosis of Acute bronchitis, unspecified organism was also pertinent to this visit.  There is no evidence of pneumonia, strep throat, sinusitis, otitis media.    Plan    1.  Meds:  The following meds were prescribed:   Discharge Medication List as of 01/19/2014  9:59 AM    START taking these medications   Details  amoxicillin-clavulanate (AUGMENTIN) 875-125 MG per tablet Take 1 tablet by mouth 2 (two) times daily., Starting 01/19/2014, Until Discontinued, Normal    chlorpheniramine-HYDROcodone (TUSSIONEX) 10-8 MG/5ML LQCR Take 5 mLs by mouth every 12 (twelve) hours as needed for cough., Starting 01/19/2014, Until Discontinued, Normal    ipratropium (ATROVENT) 0.06 % nasal spray Place 2 sprays into both nostrils 4 (four) times daily., Starting 01/19/2014, Until Discontinued, Normal    predniSONE (DELTASONE) 20 MG tablet Take 1 tablet (20 mg total) by mouth 2 (two) times daily., Starting 01/19/2014, Until Discontinued, Normal        2.  Patient Education/Counseling:  The patient was given appropriate handouts, self care instructions, and instructed in symptomatic relief.  Instructed to get extra fluids and extra rest.    3.  Follow up:  The patient was told to follow up here if no better in 3 to 4 days, or sooner  if becoming worse in any way, and given some red flag symptoms such as increasing fever, difficulty breathing, chest pain, or persistent vomiting which would prompt immediate return.       Harden Mo, MD 01/19/14 [phone]

## 2014-01-19 NOTE — Discharge Instructions (Signed)
Most upper respiratory infections are caused by viruses and do not require antibiotics.  We try to save the antibiotics for when we really need them to prevent bacteria from developing resistance to them.  Here are a few hints about things that can be done at home to help get over an upper respiratory infection quicker: ° °Get extra sleep and extra fluids.  Get 7 to 9 hours of sleep per night and 6 to 8 glasses of water a day.  Getting extra sleep keeps the immune system from getting run down.  Most people with an upper respiratory infection are a little dehydrated.  The extra fluids also keep the secretions liquified and easier to deal with.  Also, get extra vitamin C.  4000 mg per day is the recommended dose. °For the aches, headache, and fever, acetaminophen or ibuprofen are helpful.  These can be alternated every 4 hours.  People with liver disease should avoid large amounts of acetaminophen, and people with ulcer disease, gastroesophageal reflux, gastritis, congestive heart failure, chronic kidney disease, coronary artery disease and the elderly should avoid ibuprofen. °For nasal congestion try Mucinex-D, or if you're having lots of sneezing or clear nasal drainage use Zyrtec-D. People with high blood pressure can take these if their blood pressure is controlled, if not, it's best to avoid the forms with a "D" (decongestants).  You can use the plain Mucinex, Allegra, Claritin, or Zyrtec even if your blood pressure is not controlled.   °A Saline nasal spray such as Ocean Spray can also help.  You can add a decongestant sprays such as Afrin, but you should not use the decongestant sprays for more than 3 or 4 days since they can be habituating.  Breathe Rite nasal strips can also offer a non-drug alternative treatment to nasal congestion, especially at night. °For people with symptoms of sinusitis, sleeping with your head elevated can be helpful.  For sinus pain, moist, hot compresses to the face may provide some  relief.  Many people find that inhaling steam as in a shower or from a pot of steaming water can help. °For any viral infection, zinc containing lozenges such as Cold-Eze or Zicam are helpful.  Zinc helps to fight viral infection.  Hot salt water gargles (8 oz of hot water, 1/2 tsp of table salt, and a pinch of baking soda) can give relief as well as hot beverages such as hot tea.  Sucrets extra strength lozenges will help the sore throat.  °For the cough, take Delsym 2 tsp every 12 hours.  It has also been found recently that Aleve can help control a cough.  The dose is 1 to 2 tablets twice daily with food.  This can be combined with Delsym. (Note, if you are taking ibuprofen, you should not take Aleve as well--take one or the other.) °A cool mist vaporizer will help keep your mucous membranes from drying out.  ° °It's important when you have an upper respiratory infection not to pass the infection to others.  This involves being very careful about the following: ° °Frequent hand washing or use of hand sanitizer, especially after coughing, sneezing, blowing your nose or touching your face, nose or eyes. °Do not shake hands or touch anyone and try to avoid touching surfaces that other people use such as doorknobs, shopping carts, telephones and computer keyboards. °Use tissues and dispose of them properly in a garbage can or ziplock bag. °Cough into your sleeve. °Do not let others eat or   drink after you. ° °It's also important to recognize the signs of serious illness and get evaluated if they occur: °Any respiratory infection that lasts more than 7 to 10 days.  Yellow nasal drainage and sputum are not reliable indicators of a bacterial infection, but if they last for more than 1 week, see your doctor. °Fever and sore throat can indicate strep. °Fever and cough can indicate influenza or pneumonia. °Any kind of severe symptom such as difficulty breathing, intractable vomiting, or severe pain should prompt you to see  a doctor as soon as possible. ° ° °Your body's immune system is really the thing that will get rid of this infection.  Your immune system is comprised of 2 types of specialized cells called T cells and B cells.  T cells coordinate the array of cells in your body that engulf invading bacteria or viruses while B cells orchestrate the production of antibodies that neutralize infection.  Anything we do or any medications we give you, will just strengthen your immune system or help it clear up the infection quicker.  Here are a few helpful hints to improve your immune system to help overcome this illness or to prevent future infections: °· A few vitamins can improve the health of your immune system.  That's why your diet should include plenty of fruits, vegetables, fish, nuts, and whole grains. °· Vitamin A and bet-carotene can increase the cells that fight infections (T cells and B cells).  Vitamin A is abundant in dark greens and orange vegetables such as spinach, greens, sweet potatoes, and carrots. °· Vitamin B6 contributes to the maturation of white blood cells, the cells that fight disease.  Foods with vitamin B6 include cold cereal and bananas. °· Vitamin C is credited with preventing colds because it increases white blood cells and also prevents cellular damage.  Citrus fruits, peaches and green and red bell peppers are all hight in vitamin C. °· Vitamin E is an anti-oxidant that encourages the production of natural killer cells which reject foreign invaders and B cells that produce antibodies.  Foods high in vitamin E include wheat germ, nuts and seeds. °· Foods high in omega-3 fatty acids found in foods like salmon, tuna and mackerel boost your immune system and help cells to engulf and absorb germs. °· Probiotics are good bacteria that increase your T cells.  These can be found in yogurt and are available in supplements such as Culturelle or Align. °· Moderate exercise increases the strength of your immune  system and your ability to recover from illness.  I suggest 3 to 5 moderate intensity 30 minute workouts per week.   °· Sleep is another component of maintaining a strong immune system.  It enables your body to recuperate from the day's activities, stress and work.  My recommendation is to get between 7 and 9 hours of sleep per night. °· If you smoke, try to quit completely or at least cut down.  Drink alcohol only in moderation if at all.  No more than 2 drinks daily for men or 1 for women. °· Get a flu vaccine early in the fall or if you have not gotten one yet, once this illness has run its course.  If you are over 65, a smoker, or an asthmatic, get a pneumococcal vaccine. °· My final recommendation is to maintain a healthy weight.  Excess weight can impair the immune system by interfering with the way the immune system deals with invading viruses or   bacteria. °·  °

## 2014-01-21 ENCOUNTER — Telehealth: Payer: Self-pay | Admitting: Cardiology

## 2014-01-21 NOTE — Telephone Encounter (Signed)
OK to take prednisone.  I am going to review her holter today and call.

## 2014-01-21 NOTE — Telephone Encounter (Signed)
Patient was seen at [facility] on 12/26 for acute sinusitis and bronchitis. Given Amoxil, cough syrup and Prednisone 20mg  bid for 5 days. Reports having some insomnia and chest pressure, but she is coughing a lot. Just wants to know if prednisone is safe to take with her PAc's

## 2014-01-21 NOTE — Telephone Encounter (Signed)
Patient informed. Voiced good understanding.

## 2014-01-21 NOTE — Telephone Encounter (Signed)
New Message       Patient wants to speak with someone regarding her medication that was given to her @ [facility] (Prenizone 20mg ). Please give patient a call back. Thanks

## 2014-01-22 ENCOUNTER — Telehealth: Payer: Self-pay | Admitting: *Deleted

## 2014-01-22 DIAGNOSIS — E785 Hyperlipidemia, unspecified: Secondary | ICD-10-CM

## 2014-01-22 MED ORDER — ATORVASTATIN CALCIUM 20 MG PO TABS: 20.0000 mg | ORAL_TABLET | Freq: Every day | ORAL | Status: DC

## 2014-01-22 NOTE — Telephone Encounter (Signed)
Pt notified lab results and to increase atorvastatin to 20 mg daily. Pt said she is out of town right now. I advised to double up on her 10 mg tabs of lipitor. Will call Marigene Ehlers to pt new Rx on hold for lipitor 20 mg daily. Pt also said Dr. Aundra Dubin called her with her monitor results and was told that she will need to be scheduled to wear another 1 month after her recent monitor. I said I will make note of this and I am sure that Dr. Aundra Dubin will discuss this further at her appt 01/31/14. Pt said thank you.

## 2014-01-30 ENCOUNTER — Telehealth: Payer: Self-pay | Admitting: *Deleted

## 2014-01-30 DIAGNOSIS — F439 Reaction to severe stress, unspecified: Secondary | ICD-10-CM

## 2014-01-30 DIAGNOSIS — I491 Atrial premature depolarization: Secondary | ICD-10-CM

## 2014-01-30 NOTE — Telephone Encounter (Signed)
-----   Message from Larey Dresser, MD sent at 01/21/2014 10:09 PM EST ----- [name],  I would like to arrange for her to have a repeat holter monitor x 24 hours in 4 weeks.  Thanks.

## 2014-01-31 ENCOUNTER — Encounter: Payer: Self-pay | Admitting: Cardiology

## 2014-01-31 ENCOUNTER — Ambulatory Visit (INDEPENDENT_AMBULATORY_CARE_PROVIDER_SITE_OTHER): Payer: BLUE CROSS/BLUE SHIELD | Admitting: Cardiology

## 2014-01-31 VITALS — BP 118/72 | HR 70 | Ht 64.0 in | Wt 120.0 lb

## 2014-01-31 DIAGNOSIS — E785 Hyperlipidemia, unspecified: Secondary | ICD-10-CM

## 2014-01-31 DIAGNOSIS — I491 Atrial premature depolarization: Secondary | ICD-10-CM

## 2014-01-31 DIAGNOSIS — R002 Palpitations: Secondary | ICD-10-CM

## 2014-01-31 NOTE — Patient Instructions (Addendum)
Your physician has recommended that you wear a holter monitor. Holter monitors are medical devices that record the heart's electrical activity. Doctors most often use these monitors to diagnose arrhythmias. Arrhythmias are problems with the speed or rhythm of the heartbeat. The monitor is a small, portable device. You can wear one while you do your normal daily activities. This is usually used to diagnose what is causing palpitations/syncope (passing out). 99 HOUR-- IN ABOUT 3 WEEKS   Your physician recommends that you return for a FASTING lipid profile/ liver profile in March 2016.  Your physician wants you to follow-up in December 2016 with Dr Aundra Dubin.  You will receive a reminder letter in the mail two months in advance. If you don't receive a letter, please call our office to schedule the follow-up appointment.

## 2014-02-01 NOTE — Progress Notes (Signed)
Patient ID: Judy Spears, female   DOB: [birth year], 64 y.o.   MRN: [id number] PCP: Dr. Addison Lank  64 yo with history of hyperlipidemia presents for cardiology evaluation.  She has been healthy in general throughout her life.  She is quite active.  She walks 4 miles briskly at a time and does vigorous aerobic exercise.  Some weight lifting.  No exertional dyspnea or chest pain/tightness/neck pain. She has had elevated LDL coupled with a very high lipoprotein(a) level and a high LDL particle number.  She has other members of her family with elevated Lp(a).  Her mother had a stroke at age 52 that does not appear to have been atrial fibrillation-related.  Her grandfather had an MI at 37 and an uncle had CABG in his early 4s.  She is now on atorvastatin 20 mg daily.  She does not take ASA.   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  She developed a severe URI with cough/congestion/hoarseness that lasted several days.  She was very fatigued when on a 3 mile walk during this time.  It took her 2-3 weeks to really get over it.  She has had episodes of atypical chest heaviness  She saw her Ob/gyn and an irregular pulse was noted.  She then saw me in clinic and was noted to have frequent PACs.  No lightheadedness, syncope.  At last appointment, she did an ETT which was normal with excellent exercise tolerance. Holter showed frequent PACs (8% of total) but no atrial fibrillation or atrial runs.    Since that time, she feels much better.  Less stress.  No longer getting episodes of chest heaviness.  Feels back to normal.  Exercising vigorously with no problems (no exertional dyspnea or chest pain).   Labs (9/13): Lp(a) 200, TGs 58, HDL 68, LDL 148, LDL particle number 1772, K 4.3, creatinine 0.7 Labs (12/15): Lp(a) 75, LDL-P 1119, LDL 107, TSH normal  PMH: 1. Benign essential tremor 2. Hyperlipidemia 3. Migraine headaches 4. PACs: Holter (12/15) with 8% PACs, no atrial runs or atrial  fibrillation.  5. ETT (12/15) with 12:00 exercise, no ST changes, occasional PACs.   SH: Nonsmoker.  Occasional ETOH.  Married.   FH: Father with colon and prostate CA, mother with CVA at 64, grandfather with MI at 89, uncle with CABG at 26.   ROS: All systems reviewed and negative except as per HPI.   Current Outpatient Prescriptions  Medication Sig Dispense Refill  . ALPRAZolam (XANAX) 0.5 MG tablet Take 0.5 mg by mouth at bedtime as needed. 1/4 a tab prn    . aspirin EC 81 MG tablet Take 1 tablet (81 mg total) by mouth daily.    [name] atorvastatin (LIPITOR) 20 MG tablet Take 1 tablet (20 mg total) by mouth daily. 90 tablet 3  . Azelastine HCl 0.15 % SOLN Place into the nose as needed.     . fexofenadine (ALLEGRA) 60 MG tablet Take 60 mg by mouth 2 (two) times daily.     . Melatonin 3 MG TABS Take 3 mg by mouth daily.    . Multiple Vitamin (MULTIVITAMIN) capsule Take 1 capsule by mouth daily.      . pantoprazole (PROTONIX) 40 MG tablet Take 1 tablet (40 mg total) by mouth daily. 30 tablet 0  . SUMAtriptan (IMITREX) 25 MG tablet Take 25 mg by mouth as needed for migraine or headache. Patient unsure of dosage     No  current facility-administered medications for this visit.    BP 118/72 mmHg  Pulse 70  Ht 5\' 4"  (1.626 m)  Wt 120 lb (54.432 kg)  BMI 20.59 kg/m2  SpO2 97%  LMP 12/01/2010 General: NAD Neck: No JVD, no thyromegaly or thyroid nodule.  Lungs: Clear to auscultation bilaterally with normal respiratory effort. CV: Nondisplaced PMI.  Heart regular S1/S2, no S3/S4, no murmur.  No peripheral edema.  No carotid bruit.  Normal pedal pulses.  Abdomen: Soft, nontender, no hepatosplenomegaly, no distention.  Skin: Intact without lesions or rashes.  Neurologic: Alert and oriented x 3.  Psych: Normal affect. Extremities: No clubbing or cyanosis.  HEENT: Normal.   Assessment/Plan:  1. Hyperlipidemia: Patient has had a fairly high risk lipid profile.  She had high LDL but not  markedly elevated, and her HDL was high as well.  More of a concern was the very high Lp(a) level and the high LDL particle number.  Lp(a) elevation is often seen in families with premature vascular disease and is a marker of elevated risk.  It is noteworthy that her mother had a large CVA at 4 and maternal GF had MI at 61.  In the absence of the Lp(a) and LDL particle number readings, she would be in a low risk cohort. However, given her overall picture, I think that her risk of symptomatic vascular disease over the next 10 years would be > 7.5%.  Therefore, I started her on a statin.  Her recent lipid profile was improved with Lp(a) down from 200 to 75.  LDL particle number was still higher than I would like at 1119.  Therefore, I increased her atorvastatin to 20 mg daily.  - lipids 3/16.  2. PACs: Patient had a prolonged URI after Thanksgiving followed by significant stress.  She developed PACs in this setting. Holter showed 8% PACs with no atrial runs or atrial fibrillation. She had some atypical chest tightness (usually would note at night, think related to PACs). This has completely resolved.  I suspect PACs are improving with decreased stress.  Suspect benign PACs though I expect her future risk of atrial arrhythmias is higher than average.  - Repeat holter in 3 wks.  - ETT recently was normal with good exercise tolerance.   Loralie Champagne 02/01/2014

## 2014-02-07 ENCOUNTER — Telehealth: Payer: Self-pay | Admitting: Cardiology

## 2014-02-07 ENCOUNTER — Ambulatory Visit (INDEPENDENT_AMBULATORY_CARE_PROVIDER_SITE_OTHER): Payer: BLUE CROSS/BLUE SHIELD | Admitting: Physician Assistant

## 2014-02-07 ENCOUNTER — Encounter: Payer: Self-pay | Admitting: Cardiology

## 2014-02-07 ENCOUNTER — Encounter: Payer: Self-pay | Admitting: Physician Assistant

## 2014-02-07 VITALS — BP 110/70 | HR 70 | Ht 64.0 in | Wt 118.8 lb

## 2014-02-07 DIAGNOSIS — E785 Hyperlipidemia, unspecified: Secondary | ICD-10-CM

## 2014-02-07 DIAGNOSIS — R072 Precordial pain: Secondary | ICD-10-CM

## 2014-02-07 DIAGNOSIS — I491 Atrial premature depolarization: Secondary | ICD-10-CM

## 2014-02-07 DIAGNOSIS — R079 Chest pain, unspecified: Secondary | ICD-10-CM | POA: Insufficient documentation

## 2014-02-07 LAB — BASIC METABOLIC PANEL
BUN: 15 mg/dL (ref 6–23)
CO2: 32 mEq/L (ref 19–32)
CREATININE: 0.78 mg/dL (ref 0.40–1.20)
Calcium: 9.1 mg/dL (ref 8.4–10.5)
Chloride: 100 mEq/L (ref 96–112)
GFR: 79.37 mL/min (ref >60.00)
Glucose, Bld: 91 mg/dL (ref 70–99)
POTASSIUM: 4 meq/L (ref 3.5–5.1)
SODIUM: 135 meq/L (ref 135–145)

## 2014-02-07 LAB — CBC WITH DIFFERENTIAL/PLATELET
Basophils Absolute: 0 10*3/uL (ref 0.0–0.1)
Basophils Relative: 0.5 % (ref 0.0–3.0)
Eosinophils Absolute: 0.2 10*3/uL (ref 0.0–0.7)
Eosinophils Relative: 2.7 % (ref 0.0–5.0)
HCT: 39.2 % (ref 36.0–46.0)
HEMOGLOBIN: 12.8 g/dL (ref 12.0–15.0)
LYMPHS PCT: 34 % (ref 12.0–46.0)
Lymphs Abs: 2.1 10*3/uL (ref 0.7–4.0)
MCHC: 32.6 g/dL (ref 30.0–36.0)
MCV: 87.8 fl (ref 78.0–100.0)
MONO ABS: 0.7 10*3/uL (ref 0.1–1.0)
MONOS PCT: 11.7 % (ref 3.0–12.0)
Neutro Abs: 3.1 10*3/uL (ref 1.4–7.7)
Neutrophils Relative %: 51.1 % (ref 43.0–77.0)
Platelets: 247 10*3/uL (ref 150.0–400.0)
RBC: 4.47 Mil/uL (ref 3.87–5.11)
RDW: 13.8 % (ref 11.5–15.5)
WBC: 6.2 10*3/uL (ref 4.0–10.5)

## 2014-02-07 MED ORDER — METOPROLOL TARTRATE 25 MG PO TABS: 25.0000 mg | ORAL_TABLET | Freq: Once | ORAL | Status: DC

## 2014-02-07 NOTE — Telephone Encounter (Signed)
Per pt call  .chestpain

## 2014-02-07 NOTE — Telephone Encounter (Signed)
Pt states that for the last 4   days pt has been having  On ahd of  tightness of her chest. The last time was this AM about 7 AM. Pt feels fine now. Pt said that this tightness is  something new for her. She. Pt states that she has been  out of town . She will be here today and tomorrow and will leave at the weekend

## 2014-02-07 NOTE — Telephone Encounter (Signed)
Per pt call;   Having Cp off and on for the pass 4 days.

## 2014-02-07 NOTE — Telephone Encounter (Signed)
Pt scheduled to see Melina Copa, PA today at 2:30PM, pt advised.

## 2014-02-07 NOTE — Progress Notes (Signed)
Cardiology Office Note  Date:  02/07/2014   Patient ID:  Judy Spears, Judy Spears 1950/05/04, MRN [id number]  PCP:  Cari Caraway, MD  Cardiologist: Aundra Dubin  History of Present Illness: Ms. Judy Spears is a 64 y.o. active female who presents today for evaluation of chest pain. She has history of hyperlipidemia, PACs, essential tremor, and significant family history of CAD. She wore a Holter in 12/2013 demonstrating 8% PACs, no atrial run or atrial fibrillation. She did an ETT with excellent exercise tolerance, 12:00, no ST changes, occasional PACs. In the past she has had elevated LDL coupled with a very high lipoprotein(a) level and a high LDL particle number.She has other members of her family with elevated Lp(a). Her mother had a stroke at age 84 that does not appear to have been atrial fibrillation-related.Her grandfather had an MI at 80 and an uncle had CABG in his early 76s.She is now on atorvastatin 20 mg daily with plans for repeat lipids 03/2014. Labs 12/2013 revealed normal TSH. Repeat Holter planned later this month.   She presents to clinic today for recurrent chest pain. She continues to be under significant stress related to a job change to [place], [place]. She says she has always had anxiety but it concerned that it is manifesting in physical ways. On Monday she developed substernal chest tightness across her chest. It was not associated with any SOB, nausea, or diaphoresis. The discomfort was fairly constant all day Monday, Tuesday, and Wednesday. Pain was not worse with inspiration, palpation or exertion.  She exercised on the treadmill on Tuesday and felt somewhat better. She did yoga on Wednesday without increase in symptoms. She does note that today (her day off) she has less anxiety and less chest discomfort. She has not had any syncope or presyncope. She continues to have intermittent palpitations but they are not acutely related to the chest pain itself. She is not tachypneic, tachycardic, or  hypoxic. She denies any DOE, LEE or erythema, personal history of DVT/PE, or recent surgery. This was going on before her recent trip to [place]. She has history of central abdominal bloating previously resolved by PPI use but denies any overt reflux sx. No BRBPR, melena, hematemesis, recent weight changes, or orthopnea.  Past Medical History  Diagnosis Date  . Complication of anesthesia     pt has had headaches post op-did advise to hydra well preop  . Arthritis     lt great toe  . Insomnia   . Hair loss     right-sided  . Postmenopausal   . Benign essential tremor     right hand / arm   . Hyperlipidemia   . Migraine headache   . Premature atrial contractions     a. Holter (12/15) with 8% PACs, no atrial runs or atrial fibrillation.   [name] History of exercise stress test     a. ETT (12/15) with 12:00 exercise, no ST changes, occasional PACs.     Current Outpatient Prescriptions  Medication Sig Dispense Refill  . ALPRAZolam (XANAX) 0.5 MG tablet Take 0.5 mg by mouth at bedtime as needed. 1/4 a tab prn    . aspirin EC 81 MG tablet Take 1 tablet (81 mg total) by mouth daily.    [name] atorvastatin (LIPITOR) 20 MG tablet Take 1 tablet (20 mg total) by mouth daily. 90 tablet 3  . Azelastine HCl 0.15 % SOLN Place into the nose as needed.     . fexofenadine (ALLEGRA) 60 MG tablet Take 60 mg  by mouth 2 (two) times daily.     . Melatonin 3 MG TABS Take 3 mg by mouth daily.    . Multiple Vitamin (MULTIVITAMIN) capsule Take 1 capsule by mouth daily.      . pantoprazole (PROTONIX) 40 MG tablet Take 1 tablet (40 mg total) by mouth daily. 30 tablet 0  . SUMAtriptan (IMITREX) 25 MG tablet Take 25 mg by mouth as needed for migraine or headache. Patient unsure of dosage     No current facility-administered medications for this visit.    Allergies:   Tetracyclines & related   Social History:  The patient  reports that she has never smoked. She has never used smokeless tobacco. She reports that she drinks  alcohol. She reports that she does not use illicit drugs.   Family History:  The patient's family history includes Colon cancer in her father; Heart attack (age of onset: 34) in her maternal grandfather; Hypertension in her mother; Prostate cancer in her father; Stroke in her mother; Stroke (age of onset: 56) in her maternal uncle.  ROS:  Please see the history of present illness. Positive for noted above. All other systems are reviewed and otherwise negative.   PHYSICAL EXAM:  VS:  BP 110/70 mmHg  Pulse 70  Ht 5\' 4"  (1.626 m)  Wt 118 lb 12.8 oz (53.887 kg)  BMI 20.38 kg/m2  LMP 12/01/2010 BMI: Body mass index is 20.38 kg/(m^2).  Pulse ox 99% RA Well nourished, well developed active, in no acute distress HEENT: normocephalic, atraumatic Neck: no JVD, no carotid bruits Cardiac:  normal S1, S2; RRR; no murmur, rubs or gallops Lungs:  clear to auscultation bilaterally, no wheezing, rhonchi or rales Abd: soft, nontender, no hepatomegaly Ext: no edema, no erythema Skin: warm and dry Neuro:  moves all extremities spontaneously, no focal abnormalities noted  EKG:  EKG ordered today for chest pain shows NSR 71bpm with occasional PACs, TWI avL. No acute change from prior.  Recent Labs: 01/14/2014: TSH 1.550  01/14/2014: LDL (calc) 107*; Triglycerides 83   CrCl cannot be calculated (Patient has no serum creatinine result on file.).   Wt Readings from Last 3 Encounters:  02/07/14 118 lb 12.8 oz (53.887 kg)  01/31/14 120 lb (54.432 kg)  01/11/14 120 lb 12.8 oz (54.795 kg)    Other studies reviewed: See above.  ASSESSMENT AND PLAN:  1. Chest pain - largely atypical. Cardiac risk factors include hyperlipidemia and family history of CAD. Per discussion with Dr. Aundra Dubin, will proceed with cardiac CTA to definitively evaluate her coronaries. This will also exclude any ancillary thoracic structural findings. Dr. Aundra Dubin would like her to have Lopressor 25mg  one hour prior to the procedure. No  signs or symptoms to suggest PE. Will check baseline BMET in prep for contrast and add on CBC to exclude any obvious abnormalities as I do not have any recent values available. Will continue aspirin and PPI for now. ER precautions discussed. 2. Palpitations with recent Holter showing PACs - repeat planned later this month per Dr. Aundra Dubin. Recent TSH normal. I also discussed the possibility of the ambulatory AliveCor device to capture palpitations if she should have recurrent symptoms while not wearing a monitor. 3. Hyperlipidemia - repeat lipids planned 03/2014.  Disposition: F/u 03/2014 with Dr. Aundra Dubin.  Raechel Ache PA-C 02/07/2014 3:01 PM     [address] [place] [address] [phone] (office)  [phone] (fax)

## 2014-02-07 NOTE — Patient Instructions (Signed)
Your physician has recommended you make the following change in your medication: TAKE LOPRESSOR ( 25 MG ) ONE HOUR BEFORE SCAN.   Non-Cardiac CT Angiography (CTA), is a special type of CT scan that uses a computer to produce multi-dimensional views of major blood vessels throughout the body. In CT angiography, a contrast material is injected through an IV to help visualize the blood vessels   Your physician recommends that you HAVE lab work TODAY:  BMET/CBC   Your physician recommends that you [place] scheduled a follow-up appointment with Dr. Aundra Dubin Your physician recommends that you return for lab work the same day as appointment with Dr. Aundra Dubin.

## 2014-02-07 NOTE — Telephone Encounter (Signed)
Have her see flex PA today.  Given these episodes of chest pain, I think she probably ought to have a coronary CT angiogram to be safe.  Can arrange that after PA visit.

## 2014-02-07 NOTE — Telephone Encounter (Signed)
02-07-14 Pt scheduled for Cardiac CTA on 02-08-14@1p .m.  Dr. Meda Coffee was to be the reader.  Dr. Meda Coffee called her insurance company [employer] [place] and did a peer to peer medical review.  [employer] denied authorization for this test stating she did not meet [employer] medical policy guidelines for this test. Pt would need to have a normal myoview in order to meet [place].  Dr. Aundra Dubin originally recommended Cardiac CTA but will ok pt to have myoview.  Pt wants to have the Cardiac CTA as originally recommended.  Pt understands she will be self pay and quoted an "approximate" price of $1800.

## 2014-02-07 NOTE — Telephone Encounter (Signed)
error 

## 2014-02-07 NOTE — Telephone Encounter (Signed)
Pt states that for the last 4 days pt has been having chest tightness on and off, even during the night. Pt denies any other symptoms . The last time she had this symptom  was about 7:00 AM. Pt states that this is something new for her. She feels fine now. Pt has been out of town, she will be in town today and tomorrow. She will leave again the coming weekend.

## 2014-02-08 ENCOUNTER — Ambulatory Visit (HOSPITAL_COMMUNITY)
Admission: RE | Admit: 2014-02-08 | Discharge: 2014-02-08 | Disposition: A | Discharge status: Home / Self Care | Payer: BLUE CROSS/BLUE SHIELD | Source: Ambulatory Visit | Attending: Physician Assistant | Admitting: Physician Assistant

## 2014-02-08 DIAGNOSIS — R079 Chest pain, unspecified: Secondary | ICD-10-CM | POA: Diagnosis not present

## 2014-02-08 DIAGNOSIS — R072 Precordial pain: Secondary | ICD-10-CM | POA: Diagnosis not present

## 2014-02-08 MED ORDER — NITROGLYCERIN 0.4 MG SL SUBL
SUBLINGUAL_TABLET | SUBLINGUAL | Status: AC
  Administered 2014-02-08: 0.4 mg
  Filled 2014-02-08: qty 2

## 2014-02-08 MED ORDER — IOHEXOL 350 MG/ML SOLN
80.0000 mL | Freq: Once | INTRAVENOUS | Status: AC | PRN
  Administered 2014-02-08: 80 mL via INTRAVENOUS

## 2014-02-08 MED ORDER — METOPROLOL TARTRATE 1 MG/ML IV SOLN
INTRAVENOUS | Status: AC
  Administered 2014-02-08: 2.5 mg
  Filled 2014-02-08: qty 5

## 2014-02-11 ENCOUNTER — Encounter: Payer: Self-pay | Admitting: *Deleted

## 2014-02-12 ENCOUNTER — Telehealth: Payer: Self-pay | Admitting: Cardiology

## 2014-02-12 ENCOUNTER — Encounter: Payer: Self-pay | Admitting: *Deleted

## 2014-02-12 NOTE — Telephone Encounter (Signed)
02-12-14 Appeal for Cardiac CTA faxed to [facility] Dept.  Fax#[phone]

## 2014-02-22 ENCOUNTER — Encounter: Payer: Self-pay | Admitting: Radiology

## 2014-02-22 ENCOUNTER — Encounter (INDEPENDENT_AMBULATORY_CARE_PROVIDER_SITE_OTHER): Payer: BLUE CROSS/BLUE SHIELD

## 2014-02-22 DIAGNOSIS — I491 Atrial premature depolarization: Secondary | ICD-10-CM

## 2014-02-22 DIAGNOSIS — F439 Reaction to severe stress, unspecified: Secondary | ICD-10-CM

## 2014-02-22 NOTE — Progress Notes (Signed)
Patient ID: Judy Spears, female   DOB: 08-04-1950, 64 y.o.   MRN: [id number] E cardio 24hr holter applied

## 2014-02-28 ENCOUNTER — Encounter: Payer: Self-pay | Admitting: *Deleted

## 2014-02-28 ENCOUNTER — Telehealth: Payer: Self-pay | Admitting: *Deleted

## 2014-02-28 DIAGNOSIS — I491 Atrial premature depolarization: Secondary | ICD-10-CM

## 2014-02-28 NOTE — Telephone Encounter (Signed)
Dr Aundra Dubin reviewed monitor done 02/22/14-  10% PACs Not significantly different from prior.  Dr Aundra Dubin discussed with patient.  Dr Aundra Dubin recommended pt have echocardiogram.  Pt advised would ask [facility] to call to her to  schedule tomorrow.

## 2014-03-01 ENCOUNTER — Ambulatory Visit (HOSPITAL_COMMUNITY): Payer: BLUE CROSS/BLUE SHIELD | Attending: Cardiology | Admitting: Cardiology

## 2014-03-01 DIAGNOSIS — E785 Hyperlipidemia, unspecified: Secondary | ICD-10-CM | POA: Diagnosis not present

## 2014-03-01 DIAGNOSIS — I491 Atrial premature depolarization: Secondary | ICD-10-CM

## 2014-03-01 DIAGNOSIS — R079 Chest pain, unspecified: Secondary | ICD-10-CM | POA: Diagnosis not present

## 2014-03-01 NOTE — Progress Notes (Signed)
Echo performed. 

## 2014-03-06 ENCOUNTER — Telehealth: Payer: Self-pay | Admitting: Cardiology

## 2014-03-06 NOTE — Telephone Encounter (Signed)
I left a message for the patient that Dr. Aundra Dubin is waiting on a call back.

## 2014-03-06 NOTE — Telephone Encounter (Signed)
I've called him but got voicemail.  Waiting for call back.

## 2014-03-06 NOTE — Telephone Encounter (Signed)
I called and spoke with the patient. She states she spoke with Dr. Aundra Dubin on Friday and he was to call Dr. Launa Flight and discuss the patient's palpitations and possible medication. She was unsure if this had been done and she wanted to follow up. I advised the patient I would forward the message to Dr. Aundra Dubin for review. Per the patient, Dr. Corky Sing contact # is [phone]. She states Dr. Aundra Dubin can call her with any concerns or we may call her back and confirm Dr. Aundra Dubin has spoken with Dr. Toy Cookey.

## 2014-03-06 NOTE — Telephone Encounter (Signed)
New message     Talk to a nurse---pt would not tell me what she wanted

## 2014-03-07 NOTE — Telephone Encounter (Signed)
-----   Message from Larey Dresser, MD sent at 03/07/2014  8:31 AM EST ----- [name], Can you let her know that I talked to Dr Toy Cookey? He suggested Inderal (beta blocker) or SSRI.  I think that either are reasonable.  He said that he will contact her.  Thanks.  [place]

## 2014-03-07 NOTE — Telephone Encounter (Signed)
Pt advised Dr Aundra Dubin had talked with Dr Toy Cookey and Dr Toy Cookey will contact her.

## 2014-03-28 ENCOUNTER — Ambulatory Visit: Payer: BC Managed Care – PPO | Admitting: Cardiology

## 2014-03-29 ENCOUNTER — Encounter: Payer: Self-pay | Admitting: Cardiology

## 2014-03-29 ENCOUNTER — Ambulatory Visit (INDEPENDENT_AMBULATORY_CARE_PROVIDER_SITE_OTHER): Payer: BLUE CROSS/BLUE SHIELD | Admitting: Cardiology

## 2014-03-29 ENCOUNTER — Ambulatory Visit: Payer: BLUE CROSS/BLUE SHIELD | Admitting: Cardiology

## 2014-03-29 ENCOUNTER — Other Ambulatory Visit (INDEPENDENT_AMBULATORY_CARE_PROVIDER_SITE_OTHER): Payer: BLUE CROSS/BLUE SHIELD | Admitting: *Deleted

## 2014-03-29 VITALS — BP 100/74 | HR 68 | Ht 64.0 in | Wt 117.1 lb

## 2014-03-29 DIAGNOSIS — R002 Palpitations: Secondary | ICD-10-CM

## 2014-03-29 DIAGNOSIS — E785 Hyperlipidemia, unspecified: Secondary | ICD-10-CM

## 2014-03-29 DIAGNOSIS — I491 Atrial premature depolarization: Secondary | ICD-10-CM

## 2014-03-29 LAB — HEPATIC FUNCTION PANEL
ALK PHOS: 52 U/L (ref 39–117)
ALT: 26 U/L (ref 0–35)
AST: 26 U/L (ref 0–37)
Albumin: 4.1 g/dL (ref 3.5–5.2)
Bilirubin, Direct: 0.2 mg/dL (ref 0.0–0.3)
TOTAL PROTEIN: 6.8 g/dL (ref 6.0–8.3)
Total Bilirubin: 0.8 mg/dL (ref 0.2–1.2)

## 2014-03-29 LAB — LIPID PANEL
CHOL/HDL RATIO: 3
CHOLESTEROL: 166 mg/dL (ref 0–200)
HDL: 63.8 mg/dL (ref >39.00)
LDL Cholesterol: 89 mg/dL (ref 0–99)
NonHDL: 102.2
Triglycerides: 65 mg/dL (ref 0.0–149.0)
VLDL: 13 mg/dL (ref 0.0–40.0)

## 2014-03-29 MED ORDER — ROSUVASTATIN CALCIUM 5 MG PO TABS: 5.0000 mg | ORAL_TABLET | Freq: Every day | ORAL | Status: DC

## 2014-03-29 NOTE — Patient Instructions (Addendum)
Your physician has recommended you make the following change in your medication:   STOP TAKING ATORVASTATIN (LIPITOR) FOR ONE WEEK, THEN START TAKING CRESTOR 5 MG ONCE DAILY THEREAFTER.   Your physician recommends that you return for lab work in: LIPIDS AND LFT IN 5 MONTHS--PRIOR TO 6 MONTH FOLLOW-UP APPT WITH DR [place] has recommended that you wear a 24 HOUR  holter monitor. Holter monitors are medical devices that record the heart's electrical activity. Doctors most often use these monitors to diagnose arrhythmias. Arrhythmias are problems with the speed or rhythm of the heartbeat. The monitor is a small, portable device. You can wear one while you do your normal daily activities. This is usually used to diagnose what is causing palpitations/syncope (passing out).  HAVE THIS SCHEDULED PRIOR TO YOUR 6 MONTH FOLLOW-UP WITH DR [facility]   Your physician wants you to follow-up in: [place] will receive a reminder letter in the mail two months in advance. If you don't receive a letter, please call our office to schedule the follow-up appointment.

## 2014-03-29 NOTE — Addendum Note (Signed)
Addended by: Eulis Foster on: 03/29/2014 07:38 AM   Modules accepted: Orders

## 2014-03-30 NOTE — Progress Notes (Signed)
Patient ID: Judy Spears, female   DOB: 07/10/1950, 64 y.o.   MRN: [id number] PCP: Dr. Addison Lank  64 yo with history of hyperlipidemia presents for cardiology evaluation.  She has been healthy in general throughout her life.  She is quite active.  She walks 4 miles briskly at a time and does vigorous aerobic exercise.  Some weight lifting.  No exertional dyspnea or chest pain/tightness/neck pain. She has had elevated LDL coupled with a very high lipoprotein(a) level and a high LDL particle number.  She has other members of her family with elevated Lp(a).  Her mother had a stroke at age 63 that does not appear to have been atrial fibrillation-related.  Her grandfather had an MI at 83 and an uncle had CABG in his early 60s.  She is now on atorvastatin 20 mg daily.  She does not take ASA.   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  She developed a severe URI with cough/congestion/hoarseness that lasted several days.  She was very fatigued when on a 3 mile walk during this time.  It took her 2-3 weeks to really get over it.  She has had episodes of atypical chest heaviness  She saw her Ob/gyn and an irregular pulse was noted.  She then saw me in clinic and was noted to have frequent PACs.  No lightheadedness, syncope.  She did an ETT which was normal with excellent exercise tolerance. Holter showed frequent PACs (8% of total) but no atrial fibrillation or atrial runs.  We repeated a holter after a few weeks (1/16) to see if PACs would decrease, she still had 10% PACs => hours where PACs were up correlated to stressful issues during the day.  Echo in 2/16 showed a structurally normal heart. She finally ended up having a coronary CT angiogram in 1/16 for atypical chest pain that showed normal coronaries and coronary calcium score 0.   She is doing better since last appointment. She has started on Zoloft and anxiety has decreased.  No exertional dyspnea or chest pain.  No palpitations.   She recently fractured her ankle so is exercising less.  She has been having myalgias with atorvastatin despite use of coenzyme Q10.   Labs (9/13): Lp(a) 200, TGs 58, HDL 68, LDL 148, LDL particle number 1772, K 4.3, creatinine 0.7 Labs (12/15): Lp(a) 75, LDL-P 1119, LDL 107, TSH normal Labs (1/16): K 4, creatinine 0.78  PMH: 1. Benign essential tremor 2. Hyperlipidemia 3. Migraine headaches 4. PACs: Holter (12/15) with 8% PACs, no atrial runs or atrial fibrillation. Holter (1/16) with 10% PACs, no atrial fibrillation, PACs correlated with times of stress.  5. ETT (12/15) with 12:00 exercise, no ST changes, occasional PACs. Coronary CT angiogram (1/16) with normal coronaries, coronary calcium score 0.  Echo (2/16) with EF 83-15%, Grade I diastolic dysfunction, normal RV size and systolic function.   SH: Nonsmoker.  Occasional ETOH.  Married.   FH: Father with colon and prostate CA, mother with CVA at 63, grandfather with MI at 47, uncle with CABG at 72.   ROS: All systems reviewed and negative except as per HPI.   Current Outpatient Prescriptions  Medication Sig Dispense Refill  . ALPRAZolam (XANAX) 0.5 MG tablet Take 0.5 mg by mouth at bedtime as needed. 1/4 a tab prn    . aspirin EC 81 MG tablet Take 1 tablet (81 mg total) by mouth daily.    . Azelastine HCl 0.15 %  SOLN Place into the nose as needed.     . fexofenadine (ALLEGRA) 60 MG tablet Take 60 mg by mouth 2 (two) times daily.     . Melatonin 3 MG TABS Take 3 mg by mouth daily.    . Multiple Vitamin (MULTIVITAMIN) capsule Take 1 capsule by mouth daily.      . SUMAtriptan (IMITREX) 25 MG tablet Take 25 mg by mouth as needed for migraine or headache. Patient unsure of dosage    . rosuvastatin (CRESTOR) 5 MG tablet Take 1 tablet (5 mg total) by mouth daily at 6 PM. 90 tablet 3   No current facility-administered medications for this visit.    BP 100/74 mmHg  Pulse 68  Ht 5\' 4"  (1.626 m)  Wt 117 lb 1.6 oz (53.116 kg)  BMI 20.09  kg/m2  SpO2 99%  LMP 12/01/2010 General: NAD Neck: No JVD, no thyromegaly or thyroid nodule.  Lungs: Clear to auscultation bilaterally with normal respiratory effort. CV: Nondisplaced PMI.  Heart regular S1/S2, no S3/S4, no murmur.  No peripheral edema.  No carotid bruit.  Normal pedal pulses.  Abdomen: Soft, nontender, no hepatosplenomegaly, no distention.  Skin: Intact without lesions or rashes.  Neurologic: Alert and oriented x 3.  Psych: Normal affect. Extremities: No clubbing or cyanosis.   Assessment/Plan:  1. Hyperlipidemia: Patient has had a fairly high risk lipid profile.  She had high LDL but not markedly elevated, and her HDL was high as well.  More of a concern was the very high Lp(a) level and the high LDL particle number.  Lp(a) elevation is often seen in families with premature vascular disease and is a marker of elevated risk.  It is noteworthy that her mother had a large CVA at 63 and maternal GF had MI at 26.  In the absence of the Lp(a) and LDL particle number readings, she would be in a low risk cohort. She had a coronary CTA in 1/16 that showed no significant coronary disease and coronary artery calcium score 0.  I started her on a statin with improvement in Lp(a).  She has been having myalgias with atorvastatin.  I will have her stop this for a week and then start Crestor 5 mg daily. Lipomed profile in 2 months. 2. PACs: Frequent PACs seen on 2 holters, peaks in PACs corresponded to stress during the day.  She is now on Zoloft and reports improvement in anxiety.  Repeat holter in 6 months.   Loralie Champagne  03/30/2014

## 2014-08-13 ENCOUNTER — Telehealth: Payer: Self-pay | Admitting: Cardiology

## 2014-08-13 NOTE — Telephone Encounter (Signed)
Monitor has been rescheduled to 08/16/14, pt notified

## 2014-08-13 NOTE — Telephone Encounter (Signed)
New Message   Patient c/o Palpitations:  High priority if patient c/o lightheadedness and shortness of breath.  1. How long have you been having palpitations?       2 weeks  2. Are you currently experiencing lightheadedness and shortness of breath?   no  3. Have you checked your BP and heart rate? (document readings)    No have not checked them   4. Are you experiencing any other symptoms? No she just thinks about the palpations all the time

## 2014-08-13 NOTE — Telephone Encounter (Signed)
Pt states she has been having more frequent palpitations the last 2 weeks, she spoke with Dr Aundra Dubin about this last Friday. Pt has a monitor already scheduled for 10/01/14, she is requesting to move this appt to 7/22 or 7/29.

## 2014-11-15 ENCOUNTER — Encounter: Payer: Self-pay | Admitting: Internal Medicine

## 2015-01-09 ENCOUNTER — Telehealth: Payer: Self-pay | Admitting: Cardiology

## 2015-01-09 NOTE — Telephone Encounter (Signed)
New Message  Pt husband following up on having pt med/rec sent to hosp- out of system- pt husband stated he had faxed information on 01/08/15. Please call back and discuss.

## 2015-01-09 NOTE — Telephone Encounter (Signed)
Spoke with patients husband [name] he will be re-faxing the ROI back over.

## 2015-09-22 ENCOUNTER — Encounter: Payer: Self-pay | Admitting: Physical Medicine & Rehabilitation

## 2015-09-22 ENCOUNTER — Encounter
Payer: BC Managed Care – PPO | Attending: Physical Medicine & Rehabilitation | Admitting: Physical Medicine & Rehabilitation

## 2015-09-22 ENCOUNTER — Encounter: Payer: Self-pay | Admitting: Neurology

## 2015-09-22 ENCOUNTER — Ambulatory Visit (INDEPENDENT_AMBULATORY_CARE_PROVIDER_SITE_OTHER): Payer: BC Managed Care – PPO | Admitting: Neurology

## 2015-09-22 VITALS — BP 112/72 | HR 86 | Ht 63.0 in

## 2015-09-22 VITALS — BP 108/73 | HR 76

## 2015-09-22 DIAGNOSIS — R258 Other abnormal involuntary movements: Secondary | ICD-10-CM

## 2015-09-22 DIAGNOSIS — R259 Unspecified abnormal involuntary movements: Secondary | ICD-10-CM

## 2015-09-22 DIAGNOSIS — A858 Other specified viral encephalitis: Secondary | ICD-10-CM | POA: Diagnosis present

## 2015-09-22 DIAGNOSIS — R29810 Facial weakness: Secondary | ICD-10-CM | POA: Diagnosis present

## 2015-09-22 DIAGNOSIS — R269 Unspecified abnormalities of gait and mobility: Secondary | ICD-10-CM | POA: Diagnosis not present

## 2015-09-22 DIAGNOSIS — R1310 Dysphagia, unspecified: Secondary | ICD-10-CM | POA: Diagnosis not present

## 2015-09-22 DIAGNOSIS — J9690 Respiratory failure, unspecified, unspecified whether with hypoxia or hypercapnia: Secondary | ICD-10-CM | POA: Diagnosis not present

## 2015-09-22 DIAGNOSIS — G259 Extrapyramidal and movement disorder, unspecified: Secondary | ICD-10-CM

## 2015-09-22 DIAGNOSIS — A852 Arthropod-borne viral encephalitis, unspecified: Secondary | ICD-10-CM | POA: Insufficient documentation

## 2015-09-22 DIAGNOSIS — G0490 Encephalitis and encephalomyelitis, unspecified: Secondary | ICD-10-CM

## 2015-09-22 DIAGNOSIS — A86 Unspecified viral encephalitis: Secondary | ICD-10-CM

## 2015-09-22 MED ORDER — CLONAZEPAM 0.5 MG PO TABS: 0.5000 mg | ORAL_TABLET | Freq: Two times a day (BID) | ORAL | 3 refills | Status: DC

## 2015-09-22 NOTE — Patient Instructions (Signed)
I had a long discussion with the patient, caregiver and her husband regarding her slow recovery from her viral encephalitis and resultant involuntary movements akasthisias . I recommend we add Klonopin 0.25 mg tablets at noon and continue 0.5 mg twice daily in addition. I encouraged him to continue outpatient physical and occupational therapy and rehabilitation Center and keep scheduled appointment to see Dr. Tessa Lerner. Patient will hopefully have a tracheostomy and feeding tube removed over the next few months has she continues to progress. I do not believe any further neuroimaging studies are repeat EEG studies are indicated at the present time. She will return for follow-up in 3 months or call earlier if necessary.

## 2015-09-22 NOTE — Progress Notes (Signed)
Subjective:    Patient ID: Judy Spears, female    DOB: [birth year], 65 y.o.   MRN: [id number]  HPI   This is an initial evaluation for Judy Spears, a 65 yo Caucasian female who presented to an ED in [place] on 01/02/15 with rapid neurological decline including increased headaches, facial weakness, and loss of balance.  Initial serology was notable for increased St. Louis virus and [place] Nile virus IgG. 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 During her acute hospital course, initial MRI revealed hyperintensity in the bilateral caudate nuclei and follow up scan revealed T2 hyperintensity again in the caudate nuclei in addition to the basal ganglia, thalamus and midbrain. She was started on keppra for epileptiform activity on EEG. Due to her declining neuro status, the Judy Spears was intubated and a PEG was placed for nutritional support on 01/13/15. She underwent a prolonged acute hospital course and subsequent inpatient rehab and day-program course through [facility] in [place], [place]--- having just moved back to [place] over the weekend.   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 Her prior medical history was notable for migraine headaches, HLD, and retinal surgery. She remains on feedings via PEG and has capped trach. The plan with the trach is to keep in until her swallowing improves sufficiently. She requires intermittent trach care still, but is improving. Her PCP is Dr. Cari Caraway  She is sleeping well although he occsionally naps during the day. Skin is all intact. Nutritionally, her weight has remained stable. Her feeds run overnight and she has 250cc water flushes q4 hours.  She is on a timed voiding  schedule---usually continent during the day. She empties her bowels in the toilet---she can signal her need to empty to her husband. Husband states she can assist with transfers but needs max assist. She helps to push up and stand but seems to have troubles coordinating movement. Her ongoing restlessness certainly inhibits her functionality  Judy Spears saw Dr. Leonie Man this morning who adjusted her Klonopin schedule to 0.5/0.25mg /0.5mg  schedule to see if better control of her "restlessness" could be achieved.   Pain Inventory Average Pain 0 Pain Right Now 0 My pain is no pain  In the last 24 hours, has pain interfered with the following? General activity no pain Relation with others no pain Enjoyment of life no pain What TIME of day is your pain at its worst? no pain Sleep (in general) NA  Pain is worse with: no pain Pain improves with: no pain Relief from Meds: no pain  Mobility walk with assistance use a walker use a wheelchair needs help with transfers  Function disabled: date disabled . I need assistance with the following:  feeding, dressing, bathing, toileting, meal prep, household duties and shopping  Neuro/Psych trouble walking  Prior Studies Any changes since last visit?  no  Physicians involved in your care Any changes since last visit?  no   Family History  Problem Relation Age of Onset  . Stroke Mother     paralyzed on right side/aphasia  . Hypertension Mother     had stroke after stopping BP meds  . Colon cancer Father   . Prostate cancer Father   . Heart attack Maternal Grandfather 56  . Stroke Maternal Uncle 50  Social History   Social History  . Marital status: Married    Spouse name: N/A  . Number of children: N/A  . Years of education: N/A   Social History Main Topics  . Smoking status: Never Smoker  . Smokeless tobacco: Never Used  . Alcohol use 0.0 oz/week     Comment: Occasional.  . Drug use: No  . Sexual activity: Yes    Birth  control/ protection: Post-menopausal   Other Topics Concern  . None   Social History Narrative  . None   Past Surgical History:  Procedure Laterality Date  . BREAST BIOPSY  01/01/2011   Procedure: BREAST BIOPSY WITH NEEDLE LOCALIZATION;  Surgeon: Rolm Bookbinder, MD;  Location: [place];  Service: General;  Laterality: Left;  left breast wire localization biopsy  . cataracts right eye    . CESAREAN SECTION  1986  . HERNIA REPAIR  2000   RIH  . opirectinal membrane peel    . RHINOPLASTY  1983   Past Medical History:  Diagnosis Date  . Arthritis    lt great toe  . Complication of anesthesia    pt has had headaches post op-did advise to hydra well preop  . Hair loss    right-sided  . History of exercise stress test    a. ETT (12/15) with 12:00 exercise, no ST changes, occasional PACs.   . Hyperlipidemia   . Insomnia   . Migraine headache   . 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 Postmenopausal   . Premature atrial contractions    a. Holter (12/15) with 8% PACs, no atrial runs or atrial fibrillation.    BP 108/73 (BP Location: Right Arm, Patient Position: Sitting, Cuff Size: Normal)   Pulse 76   LMP 12/01/2010   SpO2 93%   Opioid Risk Score:   Fall Risk Score:  `1  Depression screen PHQ 2/9  No flowsheet data found.   Review of Systems  All other systems reviewed and are negative.      Objective:   Physical Exam   General: Alert. Makes good eye contact.  No apparent distress HEENT: Head is normocephalic, atraumatic, PERRLA, EOMI, sclera anicteric, oral mucosa pink and moist, dentition intact, ext ear canals clear,  Neck: Supple without JVD or lymphadenopathy. #4 trach with button. Heart: Reg rate and rhythm. No murmurs rubs or gallops Chest: CTA bilaterally without wheezes, rales, or rhonchi; no distress. No secretions.  Abdomen: Soft, non-tender, non-distended, bowel sounds positive. PEG intact Extremities: No clubbing,  cyanosis, or edema. Pulses are 2+ Skin: Clean and intact without signs of breakdown Neuro: Pt is alert. CN notable for decreased cough and decreased oral-motor control.  Limited phonation but can mouth and phonate when asked. Attempts at coughing are low level moans/grunts. Seems to follow all simple one step commands with 100% accuracy. Sensation--appears intact to basic pain stimulation in all 4/s. DTR's are 1+ to 2+---certainly not increased/abnl. Motor: RUE: deltoid 1/5, bicep,tricep, wrist, hand 3 to 3+/5. LUE grossly 3 to 4/5---inconsistent with effort. LE: 3 to 4- prox to distal but inconsistent once again. Seems to initiate better with left side. There was no resting tone on examination. No myoclonus but writhing/perpetual movements of lower extemities, left more than right. Left often moves into extensor pattern and holds there, but often it's a continuous movement of both legs.    Musculoskeletal: left SCM tight, can be passively ranged to neutral neck position. Left elbow with 40  degree flexion contracture, can abduct left shoulder to 80 degrees before meeting resistance--nearly 90 on right, no sublux.  Psych: Pt's affect is appropriate. Pt is cooperative        Assessment & Plan:  1. 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 Hx of respiratory failure due to the above with chronic trach/tracheal stenosis 3. Severe dysphagia due to the above 4. 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 She has no resting tone, and movement is not consistent with myoclonus   Plan: 1. Continue  Klonopin adjusted per neurology today 0.5mg  early AM, 0.25mg  mid day, and 0.5mg  in the evening 2. Consider propranolol trial 3. gabapentin--dc?--not sure why she's still on this 4. Make a referral to trach clinic.(Consider ENT follow up also) for stenosis 5. Outpt PT/OT begins tomorrow. Discussed options for splinting with patient. Consider adjustable hinged elbow brace to  promote extension. Will let OT work her first 6. Water flushes--change to day time only to decrease night time urination 7. Extensive time was spent in counseling and discussion during this encounter, totaling an hour. I will follow up with her in about a month.    Meredith Staggers, MD, [facility] 09/22/2015

## 2015-09-22 NOTE — Patient Instructions (Addendum)
1. Four x per day H20 Flushes---300 QID for 4 days, then 350 qid for 4 days, until she gets to 375cc per flush At awakening, late morning, early afternoon and dinner  2. Talk with therapy about an adjustable splint for her left arm  3. Check on botox location left neck  4. Can we stop gabapentin?  5. I'll make a referral to trach clinic---they'll call you.   6. Continue the klonopin per Dr. Clydene Fake instruction. We can watch and see how she does.    PLEASE CALL ME WITH ANY PROBLEMS OR QUESTIONS [phone])

## 2015-09-22 NOTE — Progress Notes (Signed)
[facility] [address] [place]. [place] [address] [phone]       OFFICE CONSULT NOTE  Ms. Judy Spears Date of Birth:  [birth year] Medical Record Number:  [id number]   Referring MD:  Sharion Dove, at [facility]  Reason for Referral:  Establish neurological care  HPI: Judy Spears is a 65 year Caucasian lady who is accompanied today by her husband who provides most of the history. I have also personally reviewed hospitalization notes from [facility] which are quite extensive. She presented to [facility] emergency department on 01/02/15 after 2 days of neurological decline with headache, facial droop and speech difficulties and leaning to one side. She was febrile on admission with temp 39.6 degree centigrade and her condition rapidly declined over 24 hours requiring transfer to intensive care unit with intubation. She underwent extensive surgical workup for infectious etiology which was all negative except for elevated [place] Nile andn St. Louis virus IgG antibodies. Infectious diseases were consulted and MRI scan demonstrated hyperintensity of bilateral caudate nuclei and spinal tap showed elevated white count with lymphocytic predominance. Follow-up MRI scan done on 01/06/15 showed interval development of T2 hyperintensity within bilateral caudate nuclei, basal ganglia, bilateral thalami and midbrain. EEG showed generalized periodic epileptiform discharges and she was started on Keppra 500mg  3 times daily. She subsequently underwent tracheostomy and PEG tube placement and was treated with a five-day empiric course of IVIG without improvement. She required nephrology, pulmonary, rheumatology and neurology consultations as well as infectious disease workup. She was ultimately diagnosed with Powassen virus encephalitis which is a rare tick borne Flavivirus seen mostly in [place] and [place].Judy Spears She was stabilized and transferred for inpatient rehabilitation  to the [facility] in [place] under the care of Dr. Amalia Greenhouse where she stayed from 02/10/2015 till 06/3015. She subsequently had outpatient therapy at the [facility] until 09/19/2015 when she was discharged to move back to her home in [facility]. Patient continues to have significant functional and cognitive deficits. She is still unable to swallow and eat. She has a tracheostomy tube plugged. She has a 24-hour caregiver at home. She has fluctuating sleep. She has intermittent involuntary tremors mostly in her feet but occasionally in her hands as well. These are sometimes present even in sleep. She is currently taking Klonopin 0.5 mg twice daily. She has recently been set up to start outpatient therapy in [facility] and has an appointment to see Dr. Tessa Lerner from neuro rehabilitation as well. Patient has not had any witnessed seizures and Keppra has been discontinued and is not on medication list. She is getting amantadine 150 mg twice daily to help with her wakefulness.  ROS:   14 system review of systems is positive for  trouble swallowing, itching, incontinence, slurred speech, difficulty swallowing, anxiety, restless legs PMH:  Past Medical History:  Diagnosis Date  . Arthritis    lt great toe  . Complication of anesthesia    pt has had headaches post op-did advise to hydra well preop  . Hair loss    right-sided  . History of exercise stress test    a. ETT (12/15) with 12:00 exercise, no ST changes, occasional PACs.   . Hyperlipidemia   . Insomnia   . Migraine headache   . 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 Postmenopausal   . Premature atrial contractions    a. Holter (12/15) with 8% PACs, no atrial runs or atrial fibrillation.  Social History:  Social History   Social History  . Marital status: Married    Spouse name: Judy Spears  . Number of children: Judy Spears  . Years of education: Judy Spears   Occupational History  . Not on file.   Social History  Main Topics  . Smoking status: Never Smoker  . Smokeless tobacco: Never Used  . Alcohol use 0.0 oz/week     Comment: Occasional.  . Drug use: No  . Sexual activity: Yes    Birth control/ protection: Post-menopausal   Other Topics Concern  . Not on file   Social History Narrative  . No narrative on file    Medications:   No current outpatient prescriptions on file prior to visit.   No current facility-administered medications on file prior to visit.     Allergies:   Allergies  Allergen Reactions  . Pollen Extract Other (See Comments)    Running nose  . Tetracyclines & Related     Blisters, photosensitivity    Physical Exam General: Frail cachectic middle-aged Caucasian lady who has a tracheostomy and a PEG tube., seated, in no evident distress Head: head normocephalic and atraumatic.   Neck: supple with no carotid or supraclavicular bruits Cardiovascular: regular rate and rhythm, no murmurs Musculoskeletal: no deformity Skin:  no rash/petichiae Vascular:  Normal pulses all extremities  Neurologic Exam Mental Status: Awake and fully alert. Follows simple midline and one-step commands by nodding her head and signaling yes and no. Unable to speak sentences but can mouth a few occasional short words only. Has tracheostomy .  Cranial Nerves: Fundoscopic exam reveals sharp disc margins. Pupils equal, briskly reactive to light. Extraocular movements full without nystagmus but prominent secondary dysmetria. Visual fields full to threat Hearing probably okay Facial sensation intact. Face, tongue, palate moves normally and symmetrically.  Motor: Diminished bulk and tone.throughout. Quadriparesis 3/5 but more weakness in the right upper extremity  with weakness of right grip. Mild proximal and distal weakness throughout.  Sensory.:  Difficult to test but does withdraw to painful stimuli equally all 4 extremities.  Coordination: Cannot be reliably tested Gait and Station: Patient is  in a wheelchair and unable to walk with assistance  Reflexes: 1+ and symmetric. Toes downgoing.  Plantars are both downgoing      ASSESSMENT: 74 year Caucasian lady with powassen virus  encephalitis ( rare tick borne flavivirus)in December 2016 with significant residual  motorl and cognitive deficits with  Intermittent involuntary leg greater than arm movements- possibly motor akathisia-etiology unclear from encephalitis or anxeity or side effects from medication she has received in the past   PLAN: I had a long discussion with the patient, caregiver and her husband regarding her slow recovery from her viral encephalitis and resultant involuntary movements likely motor restlessness or  akasthisias . I recommend we add Klonopin 0.25 mg tablets at noon and continue 0.5 mg twice daily in addition. If this is not effective may consider combination of low-dose risperidone and trazodone later. I encouraged him to continue outpatient physical and occupational therapy and rehabilitation Center and keep scheduled appointment to see Dr. Tessa Lerner. Patient will hopefully have a tracheostomy and feeding tube removed over the next few months has she continues to progress. I do not believe any further neuroimaging studies are repeat EEG studies are indicated at the present time. Greater than 50% time during this 60 minute consultation was spent on counseling and coordination of care about her cognitive and physical deficits, involuntary movements and answering questions She  will return for follow-up in 3 months or call earlier if necessary. Antony Contras, MD  [facility] [address] [place], [place] [address]  Phone [phone] Fax [phone] Note: This document was prepared with digital dictation and possible smart phrase technology. Any transcriptional errors that result from this process are unintentional.

## 2015-09-23 ENCOUNTER — Ambulatory Visit: Payer: BC Managed Care – PPO | Admitting: Speech Pathology

## 2015-09-23 ENCOUNTER — Ambulatory Visit: Payer: BC Managed Care – PPO | Attending: Physical Medicine & Rehabilitation | Admitting: Occupational Therapy

## 2015-09-23 ENCOUNTER — Encounter: Payer: Self-pay | Admitting: Physical Therapy

## 2015-09-23 ENCOUNTER — Encounter: Payer: Self-pay | Admitting: Occupational Therapy

## 2015-09-23 ENCOUNTER — Ambulatory Visit: Payer: BC Managed Care – PPO | Admitting: Physical Therapy

## 2015-09-23 DIAGNOSIS — M25611 Stiffness of right shoulder, not elsewhere classified: Secondary | ICD-10-CM | POA: Diagnosis present

## 2015-09-23 DIAGNOSIS — M25612 Stiffness of left shoulder, not elsewhere classified: Secondary | ICD-10-CM

## 2015-09-23 DIAGNOSIS — M25652 Stiffness of left hip, not elsewhere classified: Secondary | ICD-10-CM | POA: Diagnosis present

## 2015-09-23 DIAGNOSIS — R2689 Other abnormalities of gait and mobility: Secondary | ICD-10-CM

## 2015-09-23 DIAGNOSIS — R1312 Dysphagia, oropharyngeal phase: Secondary | ICD-10-CM

## 2015-09-23 DIAGNOSIS — M25651 Stiffness of right hip, not elsewhere classified: Secondary | ICD-10-CM | POA: Insufficient documentation

## 2015-09-23 DIAGNOSIS — M6281 Muscle weakness (generalized): Secondary | ICD-10-CM

## 2015-09-23 DIAGNOSIS — R293 Abnormal posture: Secondary | ICD-10-CM

## 2015-09-23 DIAGNOSIS — M25622 Stiffness of left elbow, not elsewhere classified: Secondary | ICD-10-CM

## 2015-09-23 DIAGNOSIS — R41841 Cognitive communication deficit: Secondary | ICD-10-CM | POA: Diagnosis present

## 2015-09-23 DIAGNOSIS — R471 Dysarthria and anarthria: Secondary | ICD-10-CM | POA: Diagnosis present

## 2015-09-23 DIAGNOSIS — R2681 Unsteadiness on feet: Secondary | ICD-10-CM | POA: Insufficient documentation

## 2015-09-23 DIAGNOSIS — R278 Other lack of coordination: Secondary | ICD-10-CM

## 2015-09-23 DIAGNOSIS — R41844 Frontal lobe and executive function deficit: Secondary | ICD-10-CM

## 2015-09-23 NOTE — Therapy (Signed)
[facility] [address] [place], [place], [address] Phone: [phone]   Fax:  [phone]  Occupational Therapy Evaluation  Patient Details  Name: Judy Spears MRN: [id number] Date of Birth: Mar 22, 1950 Referring Provider: Dr. Sharion Dove  Encounter Date: 09/23/2015      OT End of Session - 09/23/15 1822    Visit Number 1   Number of Visits 24   Date for OT Re-Evaluation 11/18/15   Authorization Type [employer] unlimited visits   OT Start Time 1017   OT Stop Time 1100   OT Time Calculation (min) 43 min   Activity Tolerance Patient tolerated treatment well      Past Medical History:  Diagnosis Date  . Arthritis    lt great toe  . Complication of anesthesia    pt has had headaches post op-did advise to hydra well preop  . Hair loss    right-sided  . History of exercise stress test    a. ETT (12/15) with 12:00 exercise, no ST changes, occasional PACs.   . Hyperlipidemia   . Insomnia   . Migraine headache   . 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 Postmenopausal   . Premature atrial contractions    a. Holter (12/15) with 8% PACs, no atrial runs or atrial fibrillation.     Past Surgical History:  Procedure Laterality Date  . BREAST BIOPSY  01/01/2011   Procedure: BREAST BIOPSY WITH NEEDLE LOCALIZATION;  Surgeon: Rolm Bookbinder, MD;  Location: [facility];  Service: General;  Laterality: Left;  left breast wire localization biopsy  . cataracts right eye    . CESAREAN SECTION  1986  . HERNIA REPAIR  2000   RIH  . opirectinal membrane peel    . RHINOPLASTY  1983    There were no vitals filed for this visit.      Subjective Assessment - 09/23/15 1022    Subjective  HI when therapist said hello.   Patient is accompained by: Family member  husband [name]   Pertinent History see epic snapshot - ABI/hypoxia from [place]   Patient Stated Goals Pt unable to state.    Currently in  Pain? No/denies  pt says No when asked - husband confirmed that he does not feel she has much pain.           [facility] OT Assessment - 09/23/15 0001      Assessment   Diagnosis ABI from [place]   Referring Provider Dr. Sharion Dove   Onset Date 01/02/15   Prior Therapy Pt had 6 months of inpt PT, OT and ST at [facility] and then 8 weeks at outpatient program at [facility].     Precautions   Precautions Fall;Other (comment)  pt with trach and peg     Restrictions   Weight Bearing Restrictions No     Balance Screen   Has the patient had a decrease in activity level because of a fear of falling?  No     Home  Environment   Family/patient expects to be discharged to: Private residence   Living Arrangements Spouse/significant other  nurse assisting 24/7   Available Help at Discharge Available 24 hours/day  has PCA assistance 7 days a week   Type of [place] Two level   Bathroom Shower/Tub Haematologist   Additional Comments Pt does stand pivot to transfer to  3 in1 in bathroom.  Pt does the same for transfer to tub bench. Also has hand held shower     Prior Function   Level of Independence Independent   Vocation Retired   [employer]   Leisure always enjoyed exercise, reading, work in the yard, music from dance.      ADL   Eating/Feeding NPO  pt on water protocol- total assist to manage cup   Grooming + 1 Total assistance  if brush/toothbrush placed in hand pt will attempt to use   Upper Body Bathing + 1 Total asssestance  will attempt to help with bathing   Lower Body Bathing + 1 Total assistance   Upper Body Dressing + 1 Total assistance  will attempt to help with donning shirt/pull up pants   Lower Body Dressing +1 Total aassistance   Toilet Tranfer Maximal assistance  can occassionally be min a   Tub/Shower Transfer Maximal assistance  can occassionally be min a     IADL   Shopping  Completely unable to shop   Light Housekeeping Does not participate in any housekeeping tasks   Meal Prep --  pt is NPO at this time   [place] on family or friends for transportation   Medication Management Is not capable of dispensing or managing own medication   Financial Management Dependent     Mobility   Mobility Status Needs assist     Written Expression   Dominant Hand Right   Handwriting --  unable     Vision - History   Baseline Vision Wears glasses all the time     Vision Assessment   Eye Alignment Within Functional Limits   Ocular Range of Motion Within Functional Limits   Tracking/Visual Pursuits Able to track stimulus in all quads without difficulty   Comment Husband reports that pt never closes one eye so doubtful pt has double vision - unable to accurately assess     Activity Tolerance   Activity Tolerance Tolerates < 10 min activity with changes in vital signs     Cognition   Overall Cognitive Status Impaired/Different from baseline   Mini Mental State Exam  Pt able to follow simple one step commands when attending. Pt with poor sustained attention and often falls asleep during session. Pt able to express basic needs when asked (i.e. do you have to go to the bathroom?)   Area of Impairment Orientation;Attention;Memory;Following commands;Safety/judgement;Awareness;Problem solving     Sensation   Light Touch Appears Intact  pt says yes when asked if she could feel me touching her   Additional Comments difficult to assess accurately given pt's cognitive status     Coordination   Gross Motor Movements are Fluid and Coordinated No   Fine Motor Movements are Fluid and Coordinated No   Other Pt unable to participate in standardized testing due to poor  motor control  and impaired cognition     Perception   Perception Impaired  appears to have very poor awarenss of position in space     Praxis   Praxis Not tested  will need to further assess  via observation     Tone   Assessment Location Right Upper Extremity;Left Upper Extremity     ROM / Strength   AROM / PROM / Strength AROM;PROM;Strength     AROM   Overall AROM  Deficits   Overall AROM Comments LUE- active shoulder flexion in sitting to approximately 70*, elbow flexion WFL's, elbow  extension 130*, supination to neutral, full wrist extension, full finger flexion and extension with good isolated movement of all digits. RUE- in sitting shoulder flexion to 10* however in  supine to 90*, full elbow extension, elbow flexion WFL's, partial supination, partial ER, full wrist and hand movement.       PROM   Overall PROM  Deficits   Overall PROM Comments BUE's limited to 90* of shoulder flexion in supine. See above for other limitations.       Strength   Overall Strength Deficits   Overall Strength Comments BUE"s severe limitations in strength however unable to assess in formal way due to postural control, tone, and impaired cognition.      Hand Function   Right Hand Gross Grasp Impaired   Left Hand Gross Grasp Impaired   Comment Pt appears to have grasp reflex in both hands and has difficulty releasing objects when in the palm of her hand. Pt also presents with utilization behavior.      RUE Tone   RUE Tone Moderate  pt with increased rigidity in sitting vs supine     LUE Tone   LUE Tone Mild                           OT Short Term Goals - 09/23/15 1748      OT SHORT TERM GOAL #1   Title Pt and husband will be mod I with HEP - 10/21/2015   Status New     OT SHORT TERM GOAL #2   Title Pt will demonstrate adequate postural control for breath support to voice at least 3 words   Status New     OT SHORT TERM GOAL #3   Title Pt will transfer to commode with moderate assistance 75% of the time.   Status New     OT SHORT TERM GOAL #4   Title Pt will tolerate positional changes as evidenced by respiration rate and motor quieting in preparation for  funcitonal mobility   Status New           OT Long Term Goals - 09/23/15 1751      OT LONG TERM GOAL #1   Title Pt and husband will be mod I with upgraded HEP - 11/18/2015   Status New     OT LONG TERM GOAL #2   Title Pt will demonstrate adequate postural control to support 5 words in supported sitting.   Status New     OT LONG TERM GOAL #3   Title Pt will demonstrate ability for low reach with RUE for functional task with  mod facilitation   Status New     OT LONG TERM GOAL #4   Title Pt will wash face with LUE with min a in supported sitting   Status New     OT LONG TERM GOAL #5   Title Pt will release objects with L hand with min faciltation    Status New     Long Term Additional Goals   Additional Long Term Goals Yes     OT LONG TERM GOAL #6   Title Pt will stand with min a while caregiver manages pants during LB dressing 75% of the time.   Status New     OT LONG TERM GOAL #7   Title Pt will transfer to commode with min a 75% of the time.   Status New     OT LONG TERM  GOAL #8   Title Pt will be able to sit unsupported with supervision 50% of the time while engaged in functional task.   Status New               Plan - 09/23/15 1755    Clinical Impression Statement Pt is an unfortunate 65 year old female s/p [place] infection on 01/02/2016 with ensueing respiratory failure/sepsis/hypoxic injury. Pt received 6 months of inpatient rehab at [facility] and then 8 weeks of day rehab.  Pt then transferred home with husband and 24/7 caregivers the end of August, 2017. PMH not available.  Pt presents today with the following deficits that impact pt's ability to participate in basic self care and mobility: spastic quadraplegia, poor trunk control, poor head control, decreased strength, decreased P/AROM, impaired UE functional use, deceased balance, decreased motor control, impaired cognition including alertness, attention, memory, simple problem solving, simple  sequencing, impaired perception, decreased pt and family education.   Pt will beneift from skilled OT to maximize basic motor, percptual and cognitive skills to decrease caregiver burden and increase pt's ability to participate in basic ADL and leisure activities in a meaningful way.    Rehab Potential Fair   Clinical Impairments Affecting Rehab Potential severity of deficits, slow rate of recovery   OT Frequency 3x / week   OT Duration 8 weeks   OT Treatment/Interventions Self-care/ADL training;Ultrasound;Moist Heat;Electrical Stimulation;DME and/or AE instruction;Neuromuscular education;Therapeutic exercise;Functional Mobility Training;Manual Therapy;Passive range of motion;Splinting;Therapeutic activities;Balance training;Patient/family education;Visual/perceptual remediation/compensation;Cognitive remediation/compensation   Plan further assess postural control in sitting and relative to UE use, sit to stand, standing, transfers. Incorporate cognition into all tasks.   Consulted and Agree with Plan of Care Patient;Family member/caregiver   Family Member Consulted husband [name]      Patient will benefit from skilled therapeutic intervention in order to improve the following deficits and impairments:  Abnormal gait, Cardiopulmonary status limiting activity, Decreased activity tolerance, Decreased balance, Decreased coordination, Decreased cognition, Decreased knowledge of use of DME, Decreased mobility, Decreased range of motion, Decreased safety awareness, Difficulty walking, Decreased strength, Impaired UE functional use, Impaired tone, Impaired vision/preception, Pain  Visit Diagnosis: Muscle weakness (generalized) - Plan: Ot plan of care cert/re-cert  Stiffness of right shoulder, not elsewhere classified - Plan: Ot plan of care cert/re-cert  Stiffness of left shoulder, not elsewhere classified - Plan: Ot plan of care cert/re-cert  Abnormal posture - Plan: Ot plan of care  cert/re-cert  Frontal lobe and executive function deficit - Plan: Ot plan of care cert/re-cert  Other abnormalities of gait and mobility - Plan: Ot plan of care cert/re-cert  Stiffness of left elbow, not elsewhere classified - Plan: Ot plan of care cert/re-cert    Problem List Patient Active Problem List   Diagnosis Date Noted  . 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 Movement disorder 09/22/2015  . Encephalitis 09/22/2015  . Chest pain 02/07/2014  . Premature atrial contractions 01/13/2014  . Abnormality of gait 12/04/2013  . Hyperlipidemia 12/21/2011  . Cardiovascular risk factor 12/21/2011  . Bunion 01/31/2008  . Metatarsalgia of both feet 09/20/2007  . FLAT FOOT 09/20/2007  . HALLUX RIGIDUS, ACQUIRED 09/20/2007    Quay Burow, OTR/L 09/23/2015, 6:30 PM  [facility] [address] [place], [place], [address] Phone: [phone]   Fax:  [phone]  Name: Judy Spears MRN: [id number] Date of Birth: 02-Mar-1950

## 2015-09-23 NOTE — Patient Instructions (Signed)
  Focus on coordination of voice and speech:  Cue big breath then say"  Loud "AH"  5x  Loud "EE" 5 x  Loud "OO" 5x  Loud "MA"  Loud "BEE"  Loud "BOW"

## 2015-09-23 NOTE — Therapy (Addendum)
[facility] [address] [place], [place], [address] Phone: [phone]   Fax:  [phone]  Physical Therapy Evaluation  Patient Details  Name: Judy Spears MRN: [id number] Date of Birth: [birth year] Referring Provider: Alger Simons, MD Sharion Dove, MD)  Encounter Date: 09/23/2015      PT End of Session - 09/23/15 2039    Visit Number 1   Number of Visits 25  eval + 24 visits   Date for PT Re-Evaluation 11/22/15   Authorization Type BCBS   PT Start Time 1100   PT Stop Time 1145   PT Time Calculation (min) 45 min   Behavior During Therapy Anxious;Restless      Past Medical History:  Diagnosis Date  . Arthritis    lt great toe  . Complication of anesthesia    pt has had headaches post op-did advise to hydra well preop  . Hair loss    right-sided  . History of exercise stress test    a. ETT (12/15) with 12:00 exercise, no ST changes, occasional PACs.   . Hyperlipidemia   . Insomnia   . Migraine headache   . 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 Postmenopausal   . Premature atrial contractions    a. Holter (12/15) with 8% PACs, no atrial runs or atrial fibrillation.     Past Surgical History:  Procedure Laterality Date  . BREAST BIOPSY  01/01/2011   Procedure: BREAST BIOPSY WITH NEEDLE LOCALIZATION;  Surgeon: Rolm Bookbinder, MD;  Location: [facility];  Service: General;  Laterality: Left;  left breast wire localization biopsy  . cataracts right eye    . CESAREAN SECTION  1986  . HERNIA REPAIR  2000   RIH  . opirectinal membrane peel    . RHINOPLASTY  1983    There were no vitals filed for this visit.       Subjective Assessment - 09/23/15 1853    Subjective History/subjective provided by husband. Pt able to give one-word answers but low volume and increased effort required. Patient discharged from multidisciplinary therapies at [facility] in [place], [place] last week.  Pt, husband moved back to [place] over weekend. Husband has hired LPN [id number]. Stair lift installed and pt has been sleeping upstairs in room. Husband forgot AFO's today, but plans to bring to next session.    Patient is accompained by: Family member  Husband, [name]   Pertinent History Precautions: trach (capped), PEG, seizure.    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 No/denies  Overt signs of discomfort only with passive L knee extension            [facility] PT Assessment - 09/23/15 1908      Assessment   Medical Diagnosis ABI   Referring Provider Alger Simons, MD  Sharion Dove, MD   Onset Date/Surgical Date 01/02/15     Precautions   Precautions Other (comment)   Precaution Comments Trach, PEG, seizure     Restrictions   Weight Bearing Restrictions No     Balance Screen   Has the patient fallen in the past 6 months No     [place]  Private residence   Living Arrangements Spouse/significant other   Available Help at Discharge Available 24 hours/day;Other (Comment)  LPN present [id number]   Type of [place] to enter   Entrance Stairs-Number of Steps "a lot", per husband. Have stair lift   Home Layout Multi-level   Home Equipment Wheelchair - manual   Additional Comments Custom tilt-in-space w/c with Roho cushion      Prior Function   Level of Independence Independent   Vocation Retired   Research officer, trade union; attorney   Leisure always enjoyed exercise, reading, work in the yard, music from dance, studied Product/process development scientist in college.      Cognition   Overall Cognitive Status Impaired/Different from baseline   Area of Impairment  Orientation;Attention;Memory;Following commands   General Comments oriented to year, stated month "july"   Current Attention Level Focused;Sustained  impaired, frequent re-arousal required   Attention Focused   Focused Attention Impaired   Focused Attention Impairment Verbal basic;Functional basic   Memory Impaired     Observation/Other Assessments   Skin Integrity All visible areas intact; per husband, sacral wound now healed     Sensation   Additional Comments Unable to formally assess sensation/proprioception due to cognitive impairments.     Coordination   Gross Motor Movements are Fluid and Coordinated No   Coordination and Movement Description Poor control, grading of LE movement in BLE's characterized by B hip ADD/IR with standing/ambulation. Noted minimal LE motor control when B hips/knees in flexion.      Posture/Postural Control   Posture/Postural Control Postural limitations   Postural Limitations Posterior pelvic tilt;Left pelvic obliquity   Posture Comments Seated/resting with BLE's supported: cervical spine/trunk laterally flexed to L     Tone   Assessment Location Right Lower Extremity;Left Lower Extremity     ROM / Strength   AROM / PROM / Strength AROM;PROM;Strength     AROM   Overall AROM  Deficits   Overall AROM Comments R hip extension limited to grossly neutral; L hip ext limited to grossly -15*;  active knee ext limited to -15* on R, -30* on L; R ankle DF limited to -5*.     PROM   Overall PROM  Deficits   Overall PROM Comments Passive R hip ext limited to grossly 5*; L hip extension limited to -5* by pain; L knee extension limited to grossly -15* by pain.  Assessed in sidelying     Strength   Overall Strength Deficits;Unable to assess;Due to impaired cognition   Overall Strength Comments Unable to formally assess BLE's due to impaired cognition, motor control.     Bed Mobility   Bed Mobility Supine to Sit;Sit to Supine;Sitting - Scoot to [employer] of  Bed;Scooting to HOB   Supine to Sit 1: +1 Total assist;2: Max assist   Supine to Sit Details (indicate cue type and reason) x2 reps; tactile cueing for movement of LUE across midline to initiate rolling from supine > R sidelying; >75% assist to advance BLE's toward EOM, and for >25% of transition from R sidelying > sit.   Sitting - Scoot to [employer] of Bed 4: Min assist;3: Mod assist   Sitting - Scoot to Edge of Bed Details (indicate cue type and reason) Pt able to initiate seated posterior scooting with on command. Effectiveness of scooting limited by decreased anterior weight shift. Able to scoot posteriorly with cueing for full anterior weight shift to unload B hips.   Sit to Supine 1: +1 Total  assist;2: Max assist   Sit to Supine - Details (indicate cue type and reason) x2 reps; initially with total A. Subsequent trial from seated > R sidelying > supine with assist for BLE management and min A at trunk to control descent. Noted significant horizontal nystagmus >10 seconds duration in R sidelying.   Scooting to [facility] 4: Min assist   Scooting to [facility] Details (indicate cue type and reason) In supine, able to effectively bridge statically; requires min A to bridge to scoot to L.     Transfers   Transfers Sit to Stand;Stand to Sit;Stand Pivot Transfers;Squat Pivot Transfers   Sit to Stand 2: Max assist;3: Mod assist   Sit to Stand Details Tactile cues for weight beaing;Tactile cues for weight shifting   Sit to Stand Details (indicate cue type and reason) Max verbal/tactile cueing for full anterior weight shift; tactile cueing/approximation at B knees to control constant LE movement.   Stand to Sit 3: Mod assist   Stand to Sit Details (indicate cue type and reason) Manual facilitation for weight shifting;Tactile cues for weight shifting;Verbal cues for technique   Stand Pivot Transfers 2: Max assist;1: +1 Total assist;1: +2 Total assist   Stand Pivot Transfer Details (indicate cue type and reason) from w/c  <> mat table with max A to total A, requiring manual facilitation of lateral weight shifting, verbal cueing/increased time for sequencing of LE movement.  Total A for transfer from w/c > toilet, +2A for safety with transfer from toilet > w/c.   Squat Pivot Transfers 2: Max Psychiatric nurse Details (indicate cue type and reason) from mat table > w/c     Ambulation/Gait   Ambulation/Gait Yes   Ambulation/Gait Assistance 1: +2 Total assist   Ambulation/Gait Assistance Details Gait x30' with +2A (3 musketeers assist) with verbal cueing to decrease velocity of movement, increase grading/control. Manual facilitation at ribcage (bilaterally) and pelvis provided to promote thoracic spine extension, upright posture; manual facilitation of lateral/anterior weight shifting; attempted to manually redirect/reposition RLE positioning, but RLE (and LLE) in rigid R hip adduction/external rotation. Subsequent trial x10' with one therapist anterior to pt with B hands on therapist's shoulders, other therapist posterior to pt providing tactile cueing at B ribcage, both to promote upright posture. Despite facilitation/cueing for extension, pt with increased thoracic spine flexion, B hip/knee flexion which increase when pt attempts to initate active movement. Despite excessive B knee flexion (constantly "bouncing"/fluctuating into increased flexion), pt able to actively prevent B knee buckling. Requires manual assist with RLE advancement for clearance.   Ambulation Distance (Feet) 30 Feet  then x10'   Assistive device None   Gait Pattern Step-through pattern;Decreased dorsiflexion - right;Decreased hip/knee flexion - right;Right flexed knee in stance;Left flexed knee in stance;Right foot flat;Left foot flat;Scissoring;Lateral trunk lean to left;Decreased weight shift to right;Narrow base of support;Poor foot clearance - right  excessive B hip adduction/external rotation   Ambulation Surface Level;Indoor      Balance   Balance Assessed Yes     Static Sitting Balance   Static Sitting - Balance Support Feet supported;Right upper extremity supported   Static Sitting - Level of Assistance 5: Stand by assistance   Static Sitting - Comment/# of Minutes approx. 45-60 seconds     Dynamic Sitting Balance   Dynamic Sitting - Balance Support Right upper extremity supported;Feet supported   Dynamic Sitting - Level of Assistance 5: Stand by assistance;4: Min assist   Reach (Patient is able to reach ___  inches to right, left, forward, back) 4  anterior with LUE     LUE Tone   LUE Tone --     RLE Tone   RLE Tone Hypertonic;Other (comment)     RLE Tone   Hypertonic Details Unable to elicit velocity-dependent tone. RLE hypertonia (R hip ER/ADD) with active movement     LLE Tone   LLE Tone Hypertonic     LLE Tone   Hypertonic Details No velocity-dependent tone in supported sitting/supine. During standing/ambualtion, LLE hypertonic (L hip ER/ADD).                           PT Education - 09/23/15 1906    Education provided Yes   Education Details Pt eval findings, goals, and POC.    Person(s) Educated Patient;Spouse   Methods Explanation   Comprehension Verbalized understanding          PT Short Term Goals - 09/23/15 2058      PT SHORT TERM GOAL #1   Title Pt will initiate anterior weight shift sufficient to unweight hips with supervision, 25% cueing in preparation for functional transfer.  (Target date: 10/21/15)     PT SHORT TERM GOAL #2   Title Pt will perform seated scooting (AP/PA and M/L) with supervision, 25% cueing to increase independence with transfers, repositioning. (10/21/15)     PT SHORT TERM GOAL #3   Title Pt will tolerate static standing with total A of standing frame x10 minutes with vital signs WNL to improve standing tolerance and for tone management.  (10/21/15)     PT SHORT TERM GOAL #4   Title Pt will perform dynamic sitting >/= 5 minutes with  BLE's supported, intermittent UE support with CGA to indicate improved postural stability/control necessary for functional transfers. (10/21/15)     PT SHORT TERM GOAL #5   Title Pt will perform sit <> side lying with mod A, 50% cueing to progress toward increased independence with bed mobility.  (10/21/15)     Additional Short Term Goals   Additional Short Term Goals Yes     PT SHORT TERM GOAL #6   Title Pt will consistently perform sit <> stand with min A, 25% cueing to indicate increased independence with functional transfers.  (10/21/15)     PT SHORT TERM GOAL #7   Title Pt will perform level transfer from w/c <> mat table with mod A, 50% cueing to decrease caregiver burden with functional transfers.  (10/21/15)           PT Long Term Goals - 09/23/15 2120      PT LONG TERM GOAL #1   Title Pt will perform supine > sit with mod A, 50% cueing to indicate increased independence with getting out of bed.  (Target: 11/18/15)     PT LONG TERM GOAL #2   Title Pt will perform sit > supine with mod A, 50% cueing to indicate increased independence with getting into bed.  (11/18/15)     PT LONG TERM GOAL #3   Title Pt will perform level transfer from w/c <> mat table with min A, 25% cueing to decrease caregiver burden with functional transfers.  (11/18/15)     PT LONG TERM GOAL #4   Title Pt will perform dynamic standing >/= 2 consecutive minutes with BUE support and mod, 50% cueing to indicate improved postural stanbility/control.   (11/18/15)     PT LONG TERM GOAL #5  Title Patient will ambulate x15' with max A of spouse using LRAD to enable pt/spouse to safely walk into/out of home bathroom.  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 PT evaluation reveals the following: hyperkinesia, impaired postural control, impaired grading/control of movement, abnormal tone in BLE's, decreased B hip/knee extension ROM (impairment on L > R). Pt will benefit from skilled outpatient PT 3x/week for 8 weeks to address said impairments.      Rehab Potential Good   Clinical Impairments Affecting Rehab Potential Positive: strong family/social support; negative: chronicity of condition   PT Frequency 3x / week   PT Duration 8 weeks   PT Treatment/Interventions ADLs/Self Care Home Management;DME Instruction;Gait training;Stair training;Functional mobility training;Therapeutic activities;Patient/family education;Cognitive remediation;Neuromuscular re-education;Therapeutic exercise;Balance training;Orthotic Fit/Training;Wheelchair mobility training;Manual techniques;Splinting;Visual/perceptual remediation/compensation;Vestibular;Passive range of motion   PT Next Visit Plan Standing frame to control hyperkinesia, LE tone; progress to active B hip/knww extension (final 15-30 degrees), as appropriate   Consulted and Agree with Plan of Care Patient;Family member/caregiver   Family Member Consulted husband, [name]      Patient will benefit from skilled therapeutic intervention in order to improve the following deficits and impairments:  Abnormal gait, Decreased balance, Decreased endurance, Decreased cognition, Cardiopulmonary status limiting activity, Decreased activity tolerance, Decreased coordination, Decreased strength, Impaired flexibility, Impaired tone, Decreased mobility, Decreased skin integrity, Increased muscle spasms, Impaired vision/preception, Decreased range of motion, Impaired UE functional use, Postural dysfunction, Pain  Visit Diagnosis: Other abnormalities of gait and mobility - Plan: PT plan of care cert/re-cert  Abnormal posture -  Plan: PT plan of care cert/re-cert  Other lack of coordination - Plan: PT plan of care cert/re-cert  Unsteadiness on feet - Plan: PT plan of care cert/re-cert  Stiffness of left hip, not elsewhere classified - Plan: PT plan of care cert/re-cert  Stiffness of right hip, not elsewhere classified - Plan: PT plan of care cert/re-cert  Muscle weakness (generalized) - Plan: PT plan of care cert/re-cert     Problem List Patient Active Problem List   Diagnosis Date Noted  . 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 Movement disorder 09/22/2015  . Encephalitis 09/22/2015  . Chest pain 02/07/2014  . Premature atrial contractions 01/13/2014  . Abnormality of gait 12/04/2013  . Hyperlipidemia 12/21/2011  . Cardiovascular risk factor 12/21/2011  . Bunion 01/31/2008  . Metatarsalgia of both feet 09/20/2007  . FLAT FOOT 09/20/2007  . HALLUX RIGIDUS, ACQUIRED 09/20/2007    Billie Ruddy, PT, DPT [facility] [address] [place], [place], [address] Phone: [phone]   Fax:  [phone] 09/23/15, 9:36 PM  Name: NIRVI LAIBLE MRN: [id number] Date of Birth: Jun 22, 1950

## 2015-09-23 NOTE — Therapy (Signed)
[facility] [address] [place], [place], [address] Phone: [phone]   Fax:  [phone]  Speech Language Pathology Evaluation  Patient Details  Name: Judy Spears MRN: [id number] Date of Birth: 06/09/1950 Referring Provider: Dr. Alger Simons  Encounter Date: 09/23/2015      End of Session - 09/23/15 1509    Visit Number 1   Number of Visits 24   Date for SLP Re-Evaluation 11/18/15   SLP Start Time 0932   SLP Stop Time  1016   SLP Time Calculation (min) 44 min   Activity Tolerance Patient limited by lethargy      Past Medical History:  Diagnosis Date  . Arthritis    lt great toe  . Complication of anesthesia    pt has had headaches post op-did advise to hydra well preop  . Hair loss    right-sided  . History of exercise stress test    a. ETT (12/15) with 12:00 exercise, no ST changes, occasional PACs.   . Hyperlipidemia   . Insomnia   . Migraine headache   . 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 Postmenopausal   . Premature atrial contractions    a. Holter (12/15) with 8% PACs, no atrial runs or atrial fibrillation.     Past Surgical History:  Procedure Laterality Date  . BREAST BIOPSY  01/01/2011   Procedure: BREAST BIOPSY WITH NEEDLE LOCALIZATION;  Surgeon: Rolm Bookbinder, MD;  Location: [facility];  Service: General;  Laterality: Left;  left breast wire localization biopsy  . cataracts right eye    . CESAREAN SECTION  1986  . HERNIA REPAIR  2000   RIH  . opirectinal membrane peel    . RHINOPLASTY  1983    There were no vitals filed for this visit.      Subjective Assessment - 09/23/15 1456    Patient is accompained by: Family member   Special Tests spouse, [name]   Currently in Pain? No/denies            SLP Evaluation [facility] - 09/23/15 1456      SLP Visit Information   SLP Received On 09/23/15   Referring Provider Dr. 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, a 65 yo Caucasian female who presented to an ED in [place] on 01/02/15 with rapid neurological decline including increased headaches, facial weakness, and loss of balance.  Initial serology was notable for increased [place] virus and [place] Nile virus IgG. 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 During her acute hospital course, initial MRI revealed hyperintensity in the bilateral caudate nuclei and follow up scan revealed T2 hyperintensity again in the caudate nuclei in addition to the basal ganglia, thalamus and midbrain. She was started on keppra for epileptiform activity on EEG. Due to her declining neuro status, the Judy Spears was intubated and a PEG was placed for nutritional support on 01/13/15. She underwent a prolonged acute hospital course and subsequent inpatient rehab and day-program course through [facility] in [place], [place]--- having just moved back to [place] over the weekend. Pt remains on PEG TF with free water protocol, otherwise NPO.   Mobility Status wheelchair     Prior  Functional Status   Cognitive/Linguistic Baseline Within functional limits   Type of Home House    Lives With Spouse   Available Support Family   Education JD   Vocation On disability     Cognition   Overall Cognitive Status Impaired/Different from baseline   Area of Impairment Orientation;Attention;Memory;Following commands   General Comments oriented to year, stated month "july"   Current Attention Level Focused;Sustained  impaired, frequent re-arousal required   Attention Focused   Focused Attention Impaired   Focused Attention Impairment Verbal basic;Functional basic   Memory Impaired     Auditory Comprehension   Yes/No Questions Within Functional Limits    Commands Impaired   Overall Auditory Comprehension Comments Physical impairments limit following of multi step commands.     Reading Comprehension   Reading Status Within funtional limits  for simple 1 step commands     Expression   Primary Mode of Expression Verbal     Verbal Expression   Level of Generative/Spontaneous Verbalization Word   Repetition Impaired   Level of Impairment Word level   Non-Verbal Means of Communication Eye gaze     Oral Motor/Sensory Function   Overall Oral Motor/Sensory Function Impaired   Labial ROM Reduced right;Reduced left   Labial Strength Reduced Left   Labial Sensation Reduced Left   Labial Coordination Reduced   Lingual ROM Reduced right   Lingual Strength Reduced   Lingual Coordination Reduced     Motor Speech   Overall Motor Speech Impaired   Respiration Impaired   Level of Impairment Word   Phonation Low vocal intensity;Breathy;Hoarse;Wet;Aphonic   Articulation Impaired   Level of Impairment Word   Intelligibility Intelligibility reduced   Word 0-24% accurate               Ice Chips - 09/23/15 1508      Ice Chips   Ice chips Impaired   Presentation Cup   Oral Phase Impairments Reduced labial seal;Reduced lingual movement/coordination;Impaired mastication;Poor awareness of bolus   Oral Phase Functional Implications Left anterior spillage;Prolonged oral transit   Pharyngeal Phase Impairments Suspected delayed Swallow;Wet Vocal Quality;Unable to trigger swallow;Cough - Delayed         Thin Liquid - 09/23/15 1509      Thin Liquid   Thin Liquid Impaired   Presentation Spoon   Oral Phase Impairments Reduced labial seal;Reduced lingual movement/coordination;Poor awareness of bolus   Oral Phase Functional Implications Left anterior spillage;Oral holding;Prolonged oral transit   Pharyngeal  Phase Impairments Decreased hyoid-laryngeal movement;Unable to trigger swallow;Wet Vocal Quality;Cough - Delayed;Suspected delayed  Swallow                   SLP Short Term Goals - 09/23/15 1533      SLP SHORT TERM GOAL #1   Title Pt will demonstrate audible phonation with prolonged vowels and cv syllables 12/15 trials with occasional min A   Time 4   Period Weeks   Status New     SLP SHORT TERM GOAL #2   Title Pt will demonstrate breath support prior to repetition of words 8/10x   Time 4   Period Weeks   Status New     SLP SHORT TERM GOAL #3   Title Pt will swallow ice chip/H20 via spoon in less than 7 seconds with usual min to mod A 3/5 trials   Time 4   Period Weeks   Status New     SLP SHORT TERM GOAL #4   Title  Pt will sustain attention to therapy task for over 7 minutes with occasional min A   Time 4   Period Weeks   Status New          SLP Long Term Goals - 09/23/15 1534      SLP LONG TERM GOAL #1   Title Pt will demonstrate audible phonation/intelligible speech with 2 word phrases with occasional min A 5/7 utterances   Time 8   Period Weeks   Status New     SLP LONG TERM GOAL #2   Title Pt will demonstrate audible cough/throat clear to command and to clear secretions with occasional min A 4/5 trials.    Time 8   Period Weeks   Status New     SLP LONG TERM GOAL #3   Title Pt will achieve timely oral phase and swallow with ice chip/ water trials 5/7 trials   Time 8   Period Weeks   Status New     SLP LONG TERM GOAL #4   Title Pt will sustain attention to therapy tasks for 12 minutes with occasiona min A   Time 8   Period Weeks   Status New          Plan - 09/23/15 1511    Clinical Impression Statement Judy Spears, a 65 y.o. 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 Pt has #4 uncuffed Shiley trach which is capped. She is NPO with PEG TF and free water protocol.  Today, Judy Spears followed 1 step spoken and written commands. Pt with weak phonation to command  inconsistently. Overall poor breath support for speech and swallowing. Automatic speech relatively intact with reduced intellgiblity/dysarthria. Repetition intact to word level., again with aphonia. Overall dysarthria with  Weak articulation with reduced coordination of phonation and respiration.  Few spontaneous utterances less than 50% intellgilible. When pt achieved audible phonation, I provided positive verbal feed back, she stated "that's not my voice."Judy Spears posture and attention level also affect her phonation, speech and swallowing. She required tactile and loud verbal cues to maintain arousal or re-arouse about every 4-6 minutes.   Clinical swallow evaluation revealed reduced bolus manipulation with ice chips and spoons of water resulting in labial leakage, spillage into pharynx with wet vocal quality and delayed weak cough, without clearing bolus. Delayed to absent pharyngeal swallow, again with wet voice/respiration, delayed weak cough, and delayed swallow. She continues with severe risk for aspiration pna and is not ready for objective swallow assessment. [name]    Speech Therapy Frequency 3x / week   Duration --  8 weeks or 24 visits   Treatment/Interventions Group dysphagia treatment;Pharyngeal strengthening exercises;Aspiration precaution training;Environmental controls;Compensatory techniques;Internal/external aids;Cognitive reorganization;Multimodal communcation approach;SLP instruction and feedback;Cueing hierarchy;Functional tasks;Patient/family education;Oral motor exercises   Potential to Achieve Goals Fair   Potential Considerations Ability to learn/carryover information;Co-morbidities;Severity of impairments   Consulted and Agree with Plan of Care Patient;Family member/caregiver   Family Member Consulted spouse, [name]      Patient will benefit from skilled therapeutic intervention in order to improve the following deficits and impairments:   Dysarthria and  anarthria  Dysphagia, oropharyngeal phase  Cognitive communication deficit    Problem List Patient Active Problem List   Diagnosis Date Noted  . 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 Movement disorder 09/22/2015  . Encephalitis 09/22/2015  . Chest pain 02/07/2014  . Premature atrial contractions 01/13/2014  . Abnormality of gait 12/04/2013  .  Hyperlipidemia 12/21/2011  . Cardiovascular risk factor 12/21/2011  . Bunion 01/31/2008  . Metatarsalgia of both feet 09/20/2007  . FLAT FOOT 09/20/2007  . HALLUX RIGIDUS, ACQUIRED 09/20/2007    Lovvorn, Annye Rusk MS, CCC-SLP 09/23/2015, 3:41 PM  [facility] [address] [place], [place], [address] Phone: [phone]   Fax:  [phone]  Name: Judy Spears MRN: [id number] Date of Birth: July 03, 1950

## 2015-09-24 ENCOUNTER — Encounter: Payer: Self-pay | Admitting: Occupational Therapy

## 2015-09-24 ENCOUNTER — Ambulatory Visit: Payer: BC Managed Care – PPO

## 2015-09-24 ENCOUNTER — Ambulatory Visit: Payer: BC Managed Care – PPO | Admitting: Occupational Therapy

## 2015-09-24 ENCOUNTER — Ambulatory Visit: Payer: BC Managed Care – PPO | Admitting: Physical Therapy

## 2015-09-24 DIAGNOSIS — R278 Other lack of coordination: Secondary | ICD-10-CM

## 2015-09-24 DIAGNOSIS — M6281 Muscle weakness (generalized): Secondary | ICD-10-CM | POA: Diagnosis not present

## 2015-09-24 DIAGNOSIS — R2689 Other abnormalities of gait and mobility: Secondary | ICD-10-CM

## 2015-09-24 DIAGNOSIS — R293 Abnormal posture: Secondary | ICD-10-CM

## 2015-09-24 DIAGNOSIS — R41841 Cognitive communication deficit: Secondary | ICD-10-CM

## 2015-09-24 DIAGNOSIS — R471 Dysarthria and anarthria: Secondary | ICD-10-CM

## 2015-09-24 DIAGNOSIS — M25612 Stiffness of left shoulder, not elsewhere classified: Secondary | ICD-10-CM

## 2015-09-24 DIAGNOSIS — R41844 Frontal lobe and executive function deficit: Secondary | ICD-10-CM

## 2015-09-24 DIAGNOSIS — R2681 Unsteadiness on feet: Secondary | ICD-10-CM

## 2015-09-24 DIAGNOSIS — M25611 Stiffness of right shoulder, not elsewhere classified: Secondary | ICD-10-CM

## 2015-09-24 DIAGNOSIS — R1312 Dysphagia, oropharyngeal phase: Secondary | ICD-10-CM

## 2015-09-24 DIAGNOSIS — M25622 Stiffness of left elbow, not elsewhere classified: Secondary | ICD-10-CM

## 2015-09-24 NOTE — Therapy (Signed)
[facility] [address] [place], [place], [address] Phone: [phone]   Fax:  [phone]  Speech Language Pathology Treatment  Patient Details  Name: Judy Spears MRN: [id number] Date of Birth: 11-06-1950 Referring Provider: Dr. Alger Simons  Encounter Date: 09/24/2015      End of Session - 09/24/15 1717    Visit Number 2   Number of Visits 24   Date for SLP Re-Evaluation 11/18/15   SLP Start Time 1448   SLP Stop Time  1530   SLP Time Calculation (min) 42 min   Activity Tolerance Patient tolerated treatment well      Past Medical History:  Diagnosis Date  . Arthritis    lt great toe  . Complication of anesthesia    pt has had headaches post op-did advise to hydra well preop  . Hair loss    right-sided  . History of exercise stress test    a. ETT (12/15) with 12:00 exercise, no ST changes, occasional PACs.   . Hyperlipidemia   . Insomnia   . Migraine headache   . 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 Postmenopausal   . Premature atrial contractions    a. Holter (12/15) with 8% PACs, no atrial runs or atrial fibrillation.     Past Surgical History:  Procedure Laterality Date  . BREAST BIOPSY  01/01/2011   Procedure: BREAST BIOPSY WITH NEEDLE LOCALIZATION;  Surgeon: Rolm Bookbinder, MD;  Location: [facility];  Service: General;  Laterality: Left;  left breast wire localization biopsy  . cataracts right eye    . CESAREAN SECTION  1986  . HERNIA REPAIR  2000   RIH  . opirectinal membrane peel    . RHINOPLASTY  1983    There were no vitals filed for this visit.      Subjective Assessment - 09/24/15 1658    Subjective Pt somnolent and needing to be awoken x7 during session.   Patient is accompained by: --  husband [name]   Currently in Pain? --  no indications of pain today           SLP Evaluation [facility] - 09/23/15 1456      SLP Visit Information   SLP Received  On 09/23/15   Referring Provider Dr. Alger Simons   Onset Date 12/31/14   Medical Diagnosis Powassan Virus encephalitis     Subjective   Patient/Family Stated Goal To talk and swallow     General Information   HPI This is an initial evaluation for Judy Spears, a 65 yo Caucasian female who presented to an ED in [place] on 01/02/15 with rapid neurological decline including increased headaches, facial weakness, and loss of balance.  Initial serology was notable for increased [place] virus and [place] Nile virus IgG. 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 During her acute hospital course, initial MRI revealed hyperintensity in the bilateral caudate nuclei and follow up scan revealed T2 hyperintensity again in the caudate nuclei in addition to the basal ganglia, thalamus and midbrain. She was started on keppra for epileptiform activity on EEG. Due to her declining neuro status, the Judy Spears was intubated and a PEG was placed for nutritional support on 01/13/15. She underwent a prolonged acute hospital course and subsequent inpatient rehab and day-program course through [facility] in [place], [place]--- having just moved back to [place] over the weekend. Pt remains on PEG TF with free  water protocol, otherwise NPO.   Mobility Status wheelchair     Prior Functional Status   Cognitive/Linguistic Baseline Within functional limits   Type of Home House    Lives With Spouse   Available Support Family   Education JD   Vocation On disability     Cognition   Overall Cognitive Status Impaired/Different from baseline   Area of Impairment Orientation;Attention;Memory;Following commands   General Comments oriented to year, stated month "july"   Current Attention Level Focused;Sustained  impaired, frequent re-arousal required   Attention Focused   Focused Attention Impaired   Focused Attention Impairment Verbal basic;Functional basic   Memory Impaired     Auditory  Comprehension   Yes/No Questions Within Functional Limits   Commands Impaired   Overall Auditory Comprehension Comments Physical impairments limit following of multi step commands.     Reading Comprehension   Reading Status Within funtional limits  for simple 1 step commands     Expression   Primary Mode of Expression Verbal     Verbal Expression   Level of Generative/Spontaneous Verbalization Word   Repetition Impaired   Level of Impairment Word level   Non-Verbal Means of Communication Eye gaze     Oral Motor/Sensory Function   Overall Oral Motor/Sensory Function Impaired   Labial ROM Reduced right;Reduced left   Labial Strength Reduced Left   Labial Sensation Reduced Left   Labial Coordination Reduced   Lingual ROM Reduced right   Lingual Strength Reduced   Lingual Coordination Reduced     Motor Speech   Overall Motor Speech Impaired   Respiration Impaired   Level of Impairment Word   Phonation Low vocal intensity;Breathy;Hoarse;Wet;Aphonic   Articulation Impaired   Level of Impairment Word   Intelligibility Intelligibility reduced   Word 0-24% accurate            ADULT SLP TREATMENT - 09/24/15 1659      General Information   Behavior/Cognition Cooperative;Lethargic;Decreased sustained attention;Pleasant mood;Distractible;Requires cueing   Oral care provided Yes  SLP used swab to assist pt in moistening her mouth/tongue     Treatment Provided   Treatment provided Cognitive-Linquistic     Cognitive-Linquistic Treatment   Treatment focused on Dysarthria;Voice   Skilled Treatment SLP facilitated pt's verbal expression and consistency in verbalization by having pt verbalize family names via pictures. Pt req'd consistent max A (visual, verbal, demo) for audible verbalization and adequate breath. SLP used tactile cues to sternum and to thyroid area for breath and voice, respectively. Pt noted to respond appropriately to humor 2/4. Pt was somnolent during session  and req'd awakening x7 during the session.      Assessment / Recommendations / Plan   Plan Continue with current plan of care     Progression Toward Goals   Progression toward goals Progressing toward goals            SLP Short Term Goals - 09/24/15 1721      SLP SHORT TERM GOAL #1   Title Pt will demonstrate audible phonation with prolonged vowels and cv syllables 12/15 trials with occasional min A   Time 4   Period Weeks   Status On-going     SLP SHORT TERM GOAL #2   Title Pt will demonstrate breath support prior to repetition of words 8/10x   Time 4   Period Weeks   Status On-going     SLP SHORT TERM GOAL #3   Title Pt will swallow ice chip/H20 via spoon in  less than 7 seconds with usual min to mod A 3/5 trials   Time 4   Period Weeks   Status On-going     SLP SHORT TERM GOAL #4   Title Pt will sustain attention to therapy task for over 7 minutes with occasional min A   Time 4   Period Weeks   Status On-going          SLP Long Term Goals - 09/24/15 1721      SLP LONG TERM GOAL #1   Title Pt will demonstrate audible phonation/intelligible speech with 2 word phrases with occasional min A 5/7 utterances   Time 8   Period Weeks   Status On-going     SLP LONG TERM GOAL #2   Title Pt will demonstrate audible cough/throat clear to command and to clear secretions with occasional min A 4/5 trials.    Time 8   Period Weeks   Status On-going     SLP LONG TERM GOAL #3   Title Pt will achieve timely oral phase and swallow with ice chip/ water trials 5/7 trials   Time 8   Period Weeks   Status On-going     SLP LONG TERM GOAL #4   Title Pt will sustain attention to therapy tasks for 12 minutes with occasiona min A   Time 8   Period Weeks   Status On-going          Plan - 09/24/15 1718    Clinical Impression Statement Pt req'd max A consistently with verbalizing family names audibly and with distinct articulation. Pt somnolent during session x7 req'd  re-arousal. Pt would cont to benefit from skilled ST to address increasing verbal output and swalloiwng skills.   Speech Therapy Frequency 3x / week   Duration --  8 weeks or 23 visits   Treatment/Interventions Group dysphagia treatment;Pharyngeal strengthening exercises;Aspiration precaution training;Environmental controls;Compensatory techniques;Internal/external aids;Cognitive reorganization;Multimodal communcation approach;SLP instruction and feedback;Cueing hierarchy;Functional tasks;Patient/family education;Oral motor exercises   Potential to Achieve Goals Fair   Potential Considerations Ability to learn/carryover information;Co-morbidities;Severity of impairments   Consulted and Agree with Plan of Care Patient;Family member/caregiver   Family Member Consulted spouse, [name]      Patient will benefit from skilled therapeutic intervention in order to improve the following deficits and impairments:   Dysarthria and anarthria  Dysphagia, oropharyngeal phase  Cognitive communication deficit    Problem List Patient Active Problem List   Diagnosis Date Noted  . 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 Movement disorder 09/22/2015  . Encephalitis 09/22/2015  . Chest pain 02/07/2014  . Premature atrial contractions 01/13/2014  . Abnormality of gait 12/04/2013  . Hyperlipidemia 12/21/2011  . Cardiovascular risk factor 12/21/2011  . Bunion 01/31/2008  . Metatarsalgia of both feet 09/20/2007  . FLAT FOOT 09/20/2007  . HALLUX RIGIDUS, ACQUIRED 09/20/2007    [facility] ,[place], CCC-SLP  09/24/2015, 5:21 PM  [facility] [address] [place], [place], [address] Phone: [phone]   Fax:  [phone]   Name: Judy Spears MRN: [id number] Date of Birth: 1950/11/14

## 2015-09-24 NOTE — Therapy (Signed)
[facility] [address] [place], [place], [address] Phone: [phone]   Fax:  [phone]  Physical Therapy Treatment  Patient Details  Name: Judy Spears MRN: [id number] Date of Birth: 1950-10-09 Referring Provider: Alger Simons, MD Sharion Dove, MD)  Encounter Date: 09/24/2015      PT End of Session - 09/24/15 1057    Visit Number 2   Number of Visits 25   Date for PT Re-Evaluation 11/22/15   Authorization Type BCBS   PT Start Time 0930   PT Stop Time 1015   PT Time Calculation (min) 45 min   Activity Tolerance Patient limited by fatigue      Past Medical History:  Diagnosis Date  . Arthritis    lt great toe  . Complication of anesthesia    pt has had headaches post op-did advise to hydra well preop  . Hair loss    right-sided  . History of exercise stress test    a. ETT (12/15) with 12:00 exercise, no ST changes, occasional PACs.   . Hyperlipidemia   . Insomnia   . Migraine headache   . 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 Postmenopausal   . Premature atrial contractions    a. Holter (12/15) with 8% PACs, no atrial runs or atrial fibrillation.     Past Surgical History:  Procedure Laterality Date  . BREAST BIOPSY  01/01/2011   Procedure: BREAST BIOPSY WITH NEEDLE LOCALIZATION;  Surgeon: Rolm Bookbinder, MD;  Location: [facility];  Service: General;  Laterality: Left;  left breast wire localization biopsy  . cataracts right eye    . CESAREAN SECTION  1986  . HERNIA REPAIR  2000   RIH  . opirectinal membrane peel    . RHINOPLASTY  1983    There were no vitals filed for this visit.      Subjective Assessment - 09/24/15 1035    Subjective "Apparently [name] slept well last night."   Patient is accompained by: Family member  husband, [name]   Pertinent History Precautions: trach (capped), PEG, seizure.    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 No/denies            [facility] PT Assessment - 09/23/15 1908      Assessment   Medical Diagnosis ABI   Referring Provider Alger Simons, MD  Sharion Dove, MD   Onset Date/Surgical Date 01/02/15     Precautions   Precautions Other (comment)   Precaution Comments Trach, PEG, seizure     Restrictions   Weight Bearing Restrictions No     Balance Screen   Has the patient fallen in the past 6 months No     [place] residence   Living Arrangements Spouse/significant other   Available Help at Discharge Available 24 hours/day;Other (Comment)  LPN present [id number]   Type of [place] to enter   Entrance Stairs-Number of Steps "a lot", per husband. Have stair lift   Home Layout Multi-level   Home Equipment Wheelchair - manual   Additional Comments Custom tilt-in-space w/c with Roho cushion  Prior Function   Level of Independence Independent   Vocation Retired   Research officer, trade union; attorney   Leisure always enjoyed exercise, reading, work in the yard, music from dance, studied Product/process development scientist in college.      Cognition   Overall Cognitive Status Impaired/Different from baseline   Area of Impairment Orientation;Attention;Memory;Following commands   General Comments oriented to year, stated month "july"   Current Attention Level Focused;Sustained  impaired, frequent re-arousal required   Attention Focused   Focused Attention Impaired   Focused Attention Impairment Verbal basic;Functional basic   Memory Impaired     Observation/Other Assessments   Skin Integrity All visible areas intact; per husband, sacral wound now healed     Sensation   Additional  Comments Unable to formally assess sensation/proprioception due to cognitive impairments.     Coordination   Gross Motor Movements are Fluid and Coordinated No   Coordination and Movement Description Poor control, grading of LE movement in BLE's characterized by B hip ADD/IR with standing/ambulation. Noted minimal LE motor control when B hips/knees in flexion.      Posture/Postural Control   Posture/Postural Control Postural limitations   Postural Limitations Posterior pelvic tilt;Left pelvic obliquity   Posture Comments Seated/resting with BLE's supported: cervical spine/trunk laterally flexed to L     Tone   Assessment Location Right Lower Extremity;Left Lower Extremity     ROM / Strength   AROM / PROM / Strength AROM;PROM;Strength     AROM   Overall AROM  Deficits   Overall AROM Comments R hip extension limited to grossly neutral; L hip ext limited to grossly -15*;  active knee ext limited to -15* on R, -30* on L; R ankle DF limited to -5*.     PROM   Overall PROM  Deficits   Overall PROM Comments Passive R hip ext limited to grossly 5*; L hip extension limited to -5* by pain; L knee extension limited to grossly -15* by pain.  Assessed in sidelying     Strength   Overall Strength Deficits;Unable to assess;Due to impaired cognition   Overall Strength Comments Unable to formally assess BLE's due to impaired cognition, motor control.     Bed Mobility   Bed Mobility Supine to Sit;Sit to Supine;Sitting - Scoot to [employer] of Bed;Scooting to HOB   Supine to Sit 1: +1 Total assist;2: Max assist   Supine to Sit Details (indicate cue type and reason) x2 reps; tactile cueing for movement of LUE across midline to initiate rolling from supine > R sidelying; >75% assist to advance BLE's toward EOM, and for >25% of transition from R sidelying > sit.   Sitting - Scoot to [employer] of Bed 4: Min assist;3: Mod assist   Sitting - Scoot to Edge of Bed Details (indicate cue type and reason) Pt able to  initiate seated posterior scooting with on command. Effectiveness of scooting limited by decreased anterior weight shift. Able to scoot posteriorly with cueing for full anterior weight shift to unload B hips.   Sit to Supine 1: +1 Total assist;2: Max assist   Sit to Supine - Details (indicate cue type and reason) x2 reps; initially with total A. Subsequent trial from seated > R sidelying > supine with assist for BLE management and min A at trunk to control descent. Noted significant horizontal nystagmus >10 seconds duration in R sidelying.   Scooting to [facility] 4: Min assist   Scooting to [facility] Details (indicate cue type and reason) In supine, able  to effectively bridge statically; requires min A to bridge to scoot to L.     Transfers   Transfers Sit to Stand;Stand to Sit;Stand Pivot Transfers;Squat Pivot Transfers   Sit to Stand 2: Max assist;3: Mod assist   Sit to Stand Details Tactile cues for weight beaing;Tactile cues for weight shifting   Sit to Stand Details (indicate cue type and reason) Max verbal/tactile cueing for full anterior weight shift; tactile cueing/approximation at B knees to control constant LE movement.   Stand to Sit 3: Mod assist   Stand to Sit Details (indicate cue type and reason) Manual facilitation for weight shifting;Tactile cues for weight shifting;Verbal cues for technique   Stand Pivot Transfers 2: Max assist;1: +1 Total assist;1: +2 Total assist   Stand Pivot Transfer Details (indicate cue type and reason) from w/c <> mat table with max A to total A, requiring manual facilitation of lateral weight shifting, verbal cueing/increased time for sequencing of LE movement.  Total A for transfer from w/c > toilet, +2A for safety with transfer from toilet > w/c.   Squat Pivot Transfers 2: Max Psychiatric nurse Details (indicate cue type and reason) from mat table > w/c     Ambulation/Gait   Ambulation/Gait Yes   Ambulation/Gait Assistance 1: +2 Total assist    Ambulation/Gait Assistance Details Gait x30' with +2A (3 musketeers assist) with verbal cueing to decrease velocity of movement, increase grading/control. Manual facilitation at ribcage (bilaterally) and pelvis provided to promote thoracic spine extension, upright posture; manual facilitation of lateral/anterior weight shifting; attempted to manually redirect/reposition RLE positioning, but RLE (and LLE) in rigid R hip adduction/external rotation. Subsequent trial x10' with one therapist anterior to pt with B hands on therapist's shoulders, other therapist posterior to pt providing tactile cueing at B ribcage, both to promote upright posture. Despite facilitation/cueing for extension, pt with increased thoracic spine flexion, B hip/knee flexion which increase when pt attempts to initate active movement. Despite excessive B knee flexion (constantly "bouncing"/fluctuating into increased flexion), pt able to actively prevent B knee buckling. Requires manual assist with RLE advancement.   Ambulation Distance (Feet) 30 Feet  then x10'   Assistive device None   Gait Pattern Step-through pattern;Decreased dorsiflexion - right;Decreased hip/knee flexion - right;Right flexed knee in stance;Left flexed knee in stance;Right foot flat;Left foot flat;Scissoring;Lateral trunk lean to left;Decreased weight shift to right;Narrow base of support;Poor foot clearance - right  excessive B hip adduction/external rotation   Ambulation Surface Level;Indoor     Balance   Balance Assessed Yes     Static Sitting Balance   Static Sitting - Balance Support Feet supported;Right upper extremity supported   Static Sitting - Level of Assistance 5: Stand by assistance   Static Sitting - Comment/# of Minutes approx. 45-60 seconds     Dynamic Sitting Balance   Dynamic Sitting - Balance Support Right upper extremity supported;Feet supported   Dynamic Sitting - Level of Assistance 5: Stand by assistance;4: Min assist   Reach (Patient  is able to reach ___ inches to right, left, forward, back) 4  anterior with LUE     LUE Tone   LUE Tone --     RLE Tone   RLE Tone Hypertonic;Other (comment)     RLE Tone   Hypertonic Details Unable to elicit velocity-dependent tone. RLE hypertonia (R hip ER/ADD) with active movement     LLE Tone   LLE Tone Hypertonic     LLE Tone   Hypertonic  Details No velocity-dependent tone in supported sitting/supine. During standing/ambualtion, LLE hypertonic (L hip ER/ADD).                     [facility] PT Treatment/Exercise - 09/24/15 0001      Transfers   Transfers Sit to Stand;Stand to Sit   Sit to Stand 3: Mod assist;2: Max assist   Sit to Stand Details Tactile cues for posture;Tactile cues for weight shifting;Manual facilitation for placement;Tactile cues for weight beaing   Sit to Stand Details (indicate cue type and reason) Multiple trials from elevated mat table with mod A; x2 trials from blue physioball with max A with BUE support at elevated mat table, multimodal cueing for LE placement, postural stability/control. Pt able to iniate anterior weight shift with cueing, increased time but weight shift ~75% of that sufficient   Stand to Sit 3: Mod assist;2: Max assist   Stand to Sit Details to standard chair and w/c with mod A; to blue physioball with max A.     Ambulation/Gait   Ambulation/Gait No     Posture/Postural Control   Posture/Postural Control Postural limitations   Postural Limitations Posterior pelvic tilt;Left pelvic obliquity   Posture Comments Cervical spine/trunk laterally flexed to L. R trunk passive elongation.      Neuro Re-ed    Neuro Re-ed Details  Static/dynamic standing x2 trials (approx. 6-9 minutes each) with BUE support on mat table for ~50% of trials. While standing, pt required from min guard (for no more than 45 seconds without hands on) to max A for postural stability/control. Verbal/tactile cueing provided to address anterior preference,  forward flexed posture, and tendency toward cervical spine/trunk lateral flexion to L side. Emphasized use of single UE (usually RUE) to re-establish midline during standing tasks. Pt required mod-max A (more assist with increased fatigue) to maintain postural control during bimanual tasks x2. Transitioned to sitting on blue physioball (+2A with one therapist posterior, one therapist anterior to patient) with BUE support. Physioball activities focused on pt actively performing pelvic tilt in all directions with emphasis on L lateral pelvic tilt (R iliac crest elevated) to promote active R trunk shortening. Noted increased RLE tone (hip ER, knee flexion) with prolonged R trunk shortening with concurrent active posterior pelvic tilt.                PT Education - 09/23/15 1906    Education provided Yes   Education Details Pt eval findings, goals, and POC.    Person(s) Educated Patient;Spouse   Methods Explanation   Comprehension Verbalized understanding          PT Short Term Goals - 09/23/15 2058      PT SHORT TERM GOAL #1   Title Pt will initiate anterior weight shift sufficient to unweight hips with supervision, 25% cueing in preparation for functional transfer.  (Target date: 10/21/15)     PT SHORT TERM GOAL #2   Title Pt will perform seated scooting (AP/PA and M/L) with supervision, 25% cueing to increase independence with transfers, repositioning. (10/21/15)     PT SHORT TERM GOAL #3   Title Pt will tolerate static standing with total A of standing frame x10 minutes with vital signs WNL to improve standing tolerance and for tone management.  (10/21/15)     PT SHORT TERM GOAL #4   Title Pt will perform dynamic sitting >/= 5 minutes with BLE's supported, intermittent UE support with CGA to indicate improved postural stability/control necessary for  functional transfers. (10/21/15)     PT SHORT TERM GOAL #5   Title Pt will perform sit <> side lying with mod A, 50% cueing to progress  toward increased independence with bed mobility.  (10/21/15)     Additional Short Term Goals   Additional Short Term Goals Yes     PT SHORT TERM GOAL #6   Title Pt will consistently perform sit <> stand with min A, 25% cueing to indicate increased independence with functional transfers.  (10/21/15)     PT SHORT TERM GOAL #7   Title Pt will perform level transfer from w/c <> mat table with mod A, 50% cueing to decrease caregiver burden with functional transfers.  (10/21/15)           PT Long Term Goals - 09/23/15 2120      PT LONG TERM GOAL #1   Title Pt will perform supine > sit with mod A, 50% cueing to indicate increased independence with getting out of bed.  (Target: 11/18/15)     PT LONG TERM GOAL #2   Title Pt will perform sit > supine with mod A, 50% cueing to indicate increased independence with getting into bed.  (11/18/15)     PT LONG TERM GOAL #3   Title Pt will perform level transfer from w/c <> mat table with min A, 25% cueing to decrease caregiver burden with functional transfers.  (11/18/15)     PT LONG TERM GOAL #4   Title Pt will perform dynamic standing >/= 2 consecutive minutes with BUE support and mod, 50% cueing to indicate improved postural stanbility/control.   (11/18/15)     PT LONG TERM GOAL #5   Title Patient will ambulate x15' with max A of spouse using LRAD to enable pt/spouse to safely walk into/out of home bathroom.  (11/18/15)               Plan - 09/24/15 1058    Clinical Impression Statement Session focused on assessing/addressing pelvic mobility, promoting active R trunk shortening during dynamic sitting (on physioball), and on pt performance of full anterior weight shift to initiate sit > stand. Pt much less limited by excessive BLE movement during this session as compared to on evaluation. Pt tolerated static/dynamic standing for 6-9 consecutive minutes with min-max A, pending pt fatigue.    Rehab Potential Good   Clinical Impairments  Affecting Rehab Potential Positive: strong family/social support; negative: chronicity of condition   PT Frequency 3x / week   PT Duration 8 weeks   PT Treatment/Interventions ADLs/Self Care Home Management;DME Instruction;Gait training;Stair training;Functional mobility training;Therapeutic activities;Patient/family education;Cognitive remediation;Neuromuscular re-education;Therapeutic exercise;Balance training;Orthotic Fit/Training;Wheelchair mobility training;Manual techniques;Splinting;Visual/perceptual remediation/compensation;Vestibular;Passive range of motion   PT Next Visit Plan If no skilled +2A available, try standing frame to control hyperkinesia, LE tone; progress to active B hip/knee extension (final 15-30 degrees), as appropriate   Consulted and Agree with Plan of Care Patient;Family member/caregiver   Family Member Consulted husband, [name]      Patient will benefit from skilled therapeutic intervention in order to improve the following deficits and impairments:  Abnormal gait, Decreased balance, Decreased endurance, Decreased cognition, Cardiopulmonary status limiting activity, Decreased activity tolerance, Decreased coordination, Decreased strength, Impaired flexibility, Impaired tone, Decreased mobility, Decreased skin integrity, Increased muscle spasms, Impaired vision/preception, Decreased range of motion, Impaired UE functional use, Postural dysfunction, Pain  Visit Diagnosis: Other abnormalities of gait and mobility  Abnormal posture  Other lack of coordination  Unsteadiness on feet  Muscle weakness (generalized)  Problem List Patient Active Problem List   Diagnosis Date Noted  . 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 Movement disorder 09/22/2015  . Encephalitis 09/22/2015  . Chest pain 02/07/2014  . Premature atrial contractions 01/13/2014  . Abnormality of gait 12/04/2013  . Hyperlipidemia 12/21/2011  . Cardiovascular risk factor 12/21/2011  . Bunion 01/31/2008   . Metatarsalgia of both feet 09/20/2007  . FLAT FOOT 09/20/2007  . HALLUX RIGIDUS, ACQUIRED 09/20/2007    Billie Ruddy, PT, DPT [facility] [address] [place], [place], [address] Phone: [phone]   Fax:  [phone] 09/24/15, 11:02 AM  Name: Judy Spears MRN: [id number] Date of Birth: 03-14-1950

## 2015-09-24 NOTE — Therapy (Signed)
[facility] [address] [place], [place], [address] Phone: [phone]   Fax:  [phone]  Occupational Therapy Treatment  Patient Details  Name: Judy Spears MRN: [id number] Date of Birth: [birth year] Referring Provider: Dr. Sharion Dove  Encounter Date: 09/24/2015      OT End of Session - 09/24/15 1437    Visit Number 2   Number of Visits 24   Date for OT Re-Evaluation 11/18/15   Authorization Type BCBS unlimited visits   OT Start Time 0845   OT Stop Time 0930   OT Time Calculation (min) 45 min   Activity Tolerance Patient tolerated treatment well   Behavior During Therapy [facility] for tasks assessed/performed      Past Medical History:  Diagnosis Date  . Arthritis    lt great toe  . Complication of anesthesia    pt has had headaches post op-did advise to hydra well preop  . Hair loss    right-sided  . History of exercise stress test    a. ETT (12/15) with 12:00 exercise, no ST changes, occasional PACs.   . Hyperlipidemia   . Insomnia   . Migraine headache   . 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 Postmenopausal   . Premature atrial contractions    a. Holter (12/15) with 8% PACs, no atrial runs or atrial fibrillation.     Past Surgical History:  Procedure Laterality Date  . BREAST BIOPSY  01/01/2011   Procedure: BREAST BIOPSY WITH NEEDLE LOCALIZATION;  Surgeon: Rolm Bookbinder, MD;  Location: [facility];  Service: General;  Laterality: Left;  left breast wire localization biopsy  . cataracts right eye    . CESAREAN SECTION  1986  . HERNIA REPAIR  2000   RIH  . opirectinal membrane peel    . RHINOPLASTY  1983    There were no vitals filed for this visit.      Subjective Assessment - 09/24/15 1420    Subjective  Patient smiles and makes eye contact   Patient is accompained by: Family member  husband [name]   Pertinent History see epic snapshot - ABI/hypoxia from [place]   Patient Stated Goals Pt unable to state.    Currently in Pain? No/denies   Multiple Pain Sites No                      OT Treatments/Exercises (OP) - 09/24/15 1421      ADLs   LB Dressing Seated on edge of mat table - worked on flexing forward to unlace and remove shoes.  Patient listing to left in sitting, faciltiated a right weight shift by having patient grab end of mat table with right hand.  Patient then able to sustain more midline alignment for sitting.  Patient able to flex forward in sitting, but task often distracting.  Patient's hand guided to shoe lace, but little attempt to utilize vision spontaneously to guide hand in this task..  Patient with motor perseveration in hands and little ability to recruit a more effective pattern- eg lateral pinch to hold lace.  Patient able to pinch and untie lace with guiding assistance. Patient had difficulty obtaining palm contact on heel of shoe to remove either shoe.  With guidance to initiate this pattern, patient able to carry it through until shoe off foot.  Nice sustained attention for over one minute.       Neurological Re-education  Exercises   Other Exercises 1 Neuromuscular reeducation to address sit to stand, stand to sit transition.  Patient with strong preference to pull with arms, for this transition.  Patient needed increased time and mod assist to initiate sufficient forward weight shift and lift off with LE's..  Once off surface of chair though, patient able to complete transition from flexion to extension in LE's.  Patient needing manual assistance to complete hip and knee extension, and needed mod assist to achieve thoracic extension initially in standing, but at times could balance herself with close supervision while standing facing high plinth.  Patient very reliant on UE 's for standing support, so worked to address need for LE's to get and stay active in a controlled fashion for standing.  Patient without  constant motion in LEs' when aligned correctly over feet (while wearing AFO's)  Flexion, bouncing pattern only present at end of session - fatigue?  anxiety?  Need to assess further.                  OT Education - 09/24/15 1436    Education provided Yes   Education Details Activation in LE's for standing, use of vision to aide with UE functional use   Person(s) Educated Patient;Spouse   Methods Explanation;Demonstration;Tactile cues;Verbal cues   Comprehension Need further instruction          OT Short Term Goals - 09/23/15 1748      OT SHORT TERM GOAL #1   Title Pt and husband will be mod I with HEP - 10/21/2015   Status New     OT SHORT TERM GOAL #2   Title Pt will demonstrate adequate postural control for breath support to voice at least 3 words   Status New     OT SHORT TERM GOAL #3   Title Pt will transfer to commode with moderate assistance 75% of the time.   Status New     OT SHORT TERM GOAL #4   Title Pt will tolerate positional changes as evidenced by respiration rate and motor quieting in preparation for funcitonal mobility   Status New           OT Long Term Goals - 09/23/15 1751      OT LONG TERM GOAL #1   Title Pt and husband will be mod I with upgraded HEP - 11/18/2015   Status New     OT LONG TERM GOAL #2   Title Pt will demonstrate adequate postural control to support 5 words in supported sitting.   Status New     OT LONG TERM GOAL #3   Title Pt will demonstrate ability for low reach with RUE for functional task with  mod facilitation   Status New     OT LONG TERM GOAL #4   Title Pt will wash face with LUE with min a in supported sitting   Status New     OT LONG TERM GOAL #5   Title Pt will release objects with L hand with min faciltation    Status New     Long Term Additional Goals   Additional Long Term Goals Yes     OT LONG TERM GOAL #6   Title Pt will stand with min a while caregiver manages pants during LB dressing 75% of  the time.   Status New     OT LONG TERM GOAL #7   Title Pt will transfer to commode with min a 75% of the time.  Status New     OT LONG TERM GOAL #8   Title Pt will be able to sit unsupported with supervision 50% of the time while engaged in functional task.   Status New               Plan - 09/24/15 1437    Clinical Impression Statement Patient with significant impairments impeding functional ability.  Patient has excellent family support.  Patient with improved ability to participate in therapy session today over evaluations yesterday.   Rehab Potential Fair   Clinical Impairments Affecting Rehab Potential severity of deficits, slow rate of recovery   OT Frequency 3x / week   OT Duration 8 weeks   OT Treatment/Interventions Self-care/ADL training;Ultrasound;Moist Heat;Electrical Stimulation;DME and/or AE instruction;Neuromuscular education;Therapeutic exercise;Functional Mobility Training;Manual Therapy;Passive range of motion;Splinting;Therapeutic activities;Balance training;Patient/family education;Visual/perceptual remediation/compensation;Cognitive remediation/compensation   Plan postural control in sitting and standing - functional use of UE's   Consulted and Agree with Plan of Care Patient;Family member/caregiver   Family Member Consulted husband [name]      Patient will benefit from skilled therapeutic intervention in order to improve the following deficits and impairments:  Abnormal gait, Cardiopulmonary status limiting activity, Decreased activity tolerance, Decreased balance, Decreased coordination, Decreased cognition, Decreased knowledge of use of DME, Decreased mobility, Decreased range of motion, Decreased safety awareness, Difficulty walking, Decreased strength, Impaired UE functional use, Impaired tone, Impaired vision/preception, Pain  Visit Diagnosis: Abnormal posture  Unsteadiness on feet  Other lack of coordination  Muscle weakness  (generalized)  Stiffness of right shoulder, not elsewhere classified  Stiffness of left shoulder, not elsewhere classified  Frontal lobe and executive function deficit  Stiffness of left elbow, not elsewhere classified    Problem List Patient Active Problem List   Diagnosis Date Noted  . 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 Movement disorder 09/22/2015  . Encephalitis 09/22/2015  . Chest pain 02/07/2014  . Premature atrial contractions 01/13/2014  . Abnormality of gait 12/04/2013  . Hyperlipidemia 12/21/2011  . Cardiovascular risk factor 12/21/2011  . Bunion 01/31/2008  . Metatarsalgia of both feet 09/20/2007  . FLAT FOOT 09/20/2007  . HALLUX RIGIDUS, ACQUIRED 09/20/2007    Mariah Milling, OTR/L 09/24/2015, 2:41 PM  [facility] [address] [place], [place], [address] Phone: [phone]   Fax:  [phone]  Name: ESTEBAN SALERA MRN: [id number] Date of Birth: 27-Dec-1950

## 2015-09-26 ENCOUNTER — Encounter: Payer: Self-pay | Admitting: Pulmonary Disease

## 2015-09-26 ENCOUNTER — Ambulatory Visit (INDEPENDENT_AMBULATORY_CARE_PROVIDER_SITE_OTHER): Payer: BC Managed Care – PPO | Admitting: Pulmonary Disease

## 2015-09-26 ENCOUNTER — Telehealth: Payer: Self-pay | Admitting: Neurology

## 2015-09-26 ENCOUNTER — Ambulatory Visit: Payer: BC Managed Care – PPO | Attending: Physical Medicine & Rehabilitation | Admitting: Occupational Therapy

## 2015-09-26 ENCOUNTER — Ambulatory Visit: Payer: BC Managed Care – PPO

## 2015-09-26 ENCOUNTER — Encounter: Payer: Self-pay | Admitting: Occupational Therapy

## 2015-09-26 ENCOUNTER — Ambulatory Visit: Payer: BC Managed Care – PPO | Admitting: Physical Therapy

## 2015-09-26 DIAGNOSIS — Z930 Tracheostomy status: Secondary | ICD-10-CM

## 2015-09-26 DIAGNOSIS — R278 Other lack of coordination: Secondary | ICD-10-CM

## 2015-09-26 DIAGNOSIS — R2681 Unsteadiness on feet: Secondary | ICD-10-CM | POA: Diagnosis present

## 2015-09-26 DIAGNOSIS — J309 Allergic rhinitis, unspecified: Secondary | ICD-10-CM | POA: Diagnosis not present

## 2015-09-26 DIAGNOSIS — R2689 Other abnormalities of gait and mobility: Secondary | ICD-10-CM | POA: Diagnosis present

## 2015-09-26 DIAGNOSIS — R41841 Cognitive communication deficit: Secondary | ICD-10-CM | POA: Insufficient documentation

## 2015-09-26 DIAGNOSIS — R293 Abnormal posture: Secondary | ICD-10-CM

## 2015-09-26 DIAGNOSIS — M25622 Stiffness of left elbow, not elsewhere classified: Secondary | ICD-10-CM

## 2015-09-26 DIAGNOSIS — R471 Dysarthria and anarthria: Secondary | ICD-10-CM | POA: Diagnosis present

## 2015-09-26 DIAGNOSIS — R41844 Frontal lobe and executive function deficit: Secondary | ICD-10-CM | POA: Diagnosis present

## 2015-09-26 DIAGNOSIS — M25652 Stiffness of left hip, not elsewhere classified: Secondary | ICD-10-CM | POA: Diagnosis present

## 2015-09-26 DIAGNOSIS — M6281 Muscle weakness (generalized): Secondary | ICD-10-CM

## 2015-09-26 DIAGNOSIS — M25611 Stiffness of right shoulder, not elsewhere classified: Secondary | ICD-10-CM | POA: Diagnosis present

## 2015-09-26 DIAGNOSIS — R1312 Dysphagia, oropharyngeal phase: Secondary | ICD-10-CM | POA: Diagnosis present

## 2015-09-26 DIAGNOSIS — M25612 Stiffness of left shoulder, not elsewhere classified: Secondary | ICD-10-CM

## 2015-09-26 NOTE — Therapy (Signed)
[facility] [address] [place], [place], [address] Phone: [phone]   Fax:  [phone]  Speech Language Pathology Treatment  Patient Details  Name: Judy Spears MRN: [id number] Date of Birth: 1950-05-24 Referring Provider: Dr. Alger Simons  Encounter Date: 09/26/2015      End of Session - 09/26/15 1657    Visit Number 3   Number of Visits 24   Date for SLP Re-Evaluation 11/18/15   SLP Start Time 47   SLP Stop Time  1616   SLP Time Calculation (min) 42 min   Activity Tolerance Patient tolerated treatment well      Past Medical History:  Diagnosis Date  . Arthritis    lt great toe  . Complication of anesthesia    pt has had headaches post op-did advise to hydra well preop  . Hair loss    right-sided  . History of exercise stress test    a. ETT (12/15) with 12:00 exercise, no ST changes, occasional PACs.   . Hyperlipidemia   . Insomnia   . Migraine headache   . 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 Postmenopausal   . Premature atrial contractions    a. Holter (12/15) with 8% PACs, no atrial runs or atrial fibrillation.     Past Surgical History:  Procedure Laterality Date  . BREAST BIOPSY  01/01/2011   Procedure: BREAST BIOPSY WITH NEEDLE LOCALIZATION;  Surgeon: Rolm Bookbinder, MD;  Location: [facility];  Service: General;  Laterality: Left;  left breast wire localization biopsy  . cataracts right eye    . CESAREAN SECTION  1986  . HERNIA REPAIR  2000   RIH  . opirectinal membrane peel    . RHINOPLASTY  1983    There were no vitals filed for this visit.      Subjective Assessment - 09/26/15 1537    Patient is accompained by: (P)  --  [name], husband               ADULT SLP TREATMENT - 09/26/15 1624      General Information   Behavior/Cognition Cooperative;Lethargic;Decreased sustained attention;Pleasant mood;Distractible;Requires cueing     Treatment  Provided   Treatment provided Dysphagia     Dysphagia Treatment   Temperature Spikes Noted No   Oral Cavity - Dentition Adequate natural dentition   Treatment Methods Therapeutic exercise;Skilled observation   Patient observed directly with PO's No   Pharyngeal Phase Signs & Symptoms Delayed cough   Other treatment/comments SLP performed oral care with pt with mild stringy white mucous. SLP facilitated oral stimulation for swallow by frozen lemon swabs with lingual manipulation which pt performed with min A occasionally. Pt swallowed within 10 seconds of request x3. Delayed cough occurred x1 (approx 5 minutes after lemon swabs).      Cognitive-Linquistic Treatment   Treatment focused on Dysarthria;Voice   Skilled Treatment SLP facilitated pt's verbalization/respiration for voice by hand over hand assist to raise arms and breathe appropriately for loud /a/, successful 25% of the time. Pt with more verbalization today, 85% aphonic and with minimal oral movement. Intelligibility for any >2 word utterance was <10%. Pt was approx 50% intelligible with one word utterances.     Assessment / Recommendations / Plan   Plan Continue with current plan of care     Progression Toward Goals   Progression toward goals Progressing toward goals  SLP Short Term Goals - 09/26/15 1659      SLP SHORT TERM GOAL #1   Title Pt will demonstrate audible phonation with prolonged vowels and cv syllables 12/15 trials with occasional min A   Time 4   Period Weeks   Status On-going     SLP SHORT TERM GOAL #2   Title Pt will demonstrate breath support prior to repetition of words 8/10x   Time 4   Period Weeks   Status On-going     SLP SHORT TERM GOAL #3   Title Pt will swallow ice chip/H20 via spoon in less than 7 seconds with usual min to mod A 3/5 trials   Time 4   Period Weeks   Status On-going     SLP SHORT TERM GOAL #4   Title Pt will sustain attention to therapy task for over 7 minutes  with occasional min A   Time 4   Period Weeks   Status On-going          SLP Long Term Goals - 09/26/15 1659      SLP LONG TERM GOAL #1   Title Pt will demonstrate audible phonation/intelligible speech with 2 word phrases with occasional min A 5/7 utterances   Time 8   Period Weeks   Status On-going     SLP LONG TERM GOAL #2   Title Pt will demonstrate audible cough/throat clear to command and to clear secretions with occasional min A 4/5 trials.    Time 8   Period Weeks   Status On-going     SLP LONG TERM GOAL #3   Title Pt will achieve timely oral phase and swallow with ice chip/ water trials 5/7 trials   Time 8   Period Weeks   Status On-going     SLP LONG TERM GOAL #4   Title Pt will sustain attention to therapy tasks for 12 minutes with occasiona min A   Time 8   Period Weeks   Status On-going          Plan - 09/26/15 1658    Clinical Impression Statement Pt req'd max A consistently with verbalizing loud /a/ and answers to simple questions. Had x3 spontaneous swallows upon request (within 10 seconds). Pt somnolent during session x6 req'd re-arousal. Pt would cont to benefit from skilled ST to address increasing verbal output and swalloiwng skills.   Speech Therapy Frequency 3x / week   Duration --  8 weeks or 22 visits   Treatment/Interventions Group dysphagia treatment;Pharyngeal strengthening exercises;Aspiration precaution training;Environmental controls;Compensatory techniques;Internal/external aids;Cognitive reorganization;Multimodal communcation approach;SLP instruction and feedback;Cueing hierarchy;Functional tasks;Patient/family education;Oral motor exercises   Potential to Achieve Goals Fair   Potential Considerations Ability to learn/carryover information;Co-morbidities;Severity of impairments   Consulted and Agree with Plan of Care Patient;Family member/caregiver   Family Member Consulted spouse, [name]      Patient will benefit from skilled  therapeutic intervention in order to improve the following deficits and impairments:   Dysarthria and anarthria  Dysphagia, oropharyngeal phase  Cognitive communication deficit    Problem List Patient Active Problem List   Diagnosis Date Noted  . Tracheostomy status ([place]) 09/26/2015  . Allergic rhinitis 09/26/2015  . 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 Movement disorder 09/22/2015  . Encephalitis 09/22/2015  . Chest pain 02/07/2014  . Premature atrial contractions 01/13/2014  . Abnormality of gait 12/04/2013  . Hyperlipidemia 12/21/2011  . Cardiovascular risk factor 12/21/2011  . Bunion 01/31/2008  . Metatarsalgia of both feet 09/20/2007  .  FLAT FOOT 09/20/2007  . HALLUX RIGIDUS, ACQUIRED 09/20/2007    [facility], CCC-SLP  09/26/2015, 5:00 PM  [facility] [address] [place], [place], [address] Phone: [phone]   Fax:  [phone]   Name: Judy Spears MRN: [id number] Date of Birth: [birth year]

## 2015-09-26 NOTE — Telephone Encounter (Signed)
I called the left temple message on Judy Spears answering machine to check on how she was doing with the medication change of Klonopin. And left message for her husband to call me back.

## 2015-09-26 NOTE — Assessment & Plan Note (Signed)
Continue flonase and loratadine 

## 2015-09-26 NOTE — Patient Instructions (Signed)
Keep performing tracheostomy care as you are doing We will see you back for a routine tracheostomy visit in mid October AT [facility] and will make a decision with you about removing the tracheostomy

## 2015-09-26 NOTE — Patient Instructions (Signed)
Raise arms up and breathe deeply when you find a few moments to do so.

## 2015-09-26 NOTE — Therapy (Signed)
[facility] [address] [place], [place], [address] Phone: [phone]   Fax:  [phone]  Occupational Therapy Treatment  Patient Details  Name: Judy Spears MRN: [id number] Date of Birth: January 23, 1951 Referring Provider: Dr. Sharion Dove  Encounter Date: 09/26/2015      OT End of Session - 09/26/15 1353    Visit Number 3   Number of Visits 24   Date for OT Re-Evaluation 11/18/15   Authorization Type BCBS unlimited visits   OT Start Time 1230   OT Stop Time 1315   OT Time Calculation (min) 45 min   Activity Tolerance Patient tolerated treatment well   Behavior During Therapy [facility] for tasks assessed/performed      Past Medical History:  Diagnosis Date  . Arthritis    lt great toe  . Complication of anesthesia    pt has had headaches post op-did advise to hydra well preop  . Hair loss    right-sided  . History of exercise stress test    a. ETT (12/15) with 12:00 exercise, no ST changes, occasional PACs.   . Hyperlipidemia   . Insomnia   . Migraine headache   . 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 Postmenopausal   . Premature atrial contractions    a. Holter (12/15) with 8% PACs, no atrial runs or atrial fibrillation.     Past Surgical History:  Procedure Laterality Date  . BREAST BIOPSY  01/01/2011   Procedure: BREAST BIOPSY WITH NEEDLE LOCALIZATION;  Surgeon: Rolm Bookbinder, MD;  Location: [facility];  Service: General;  Laterality: Left;  left breast wire localization biopsy  . cataracts right eye    . CESAREAN SECTION  1986  . HERNIA REPAIR  2000   RIH  . opirectinal membrane peel    . RHINOPLASTY  1983    There were no vitals filed for this visit.      Subjective Assessment - 09/26/15 1335    Subjective  "I'm done."  Patient indicates at end of session in response to "can we stand one mroe time, or are you done?"   Patient is accompained by: Family member    Pertinent History see epic snapshot - ABI/hypoxia from [place]   Patient Stated Goals Pt unable to state.    Currently in Pain? No/denies   Multiple Pain Sites No                      OT Treatments/Exercises (OP) - 09/26/15 0001      Neurological Re-education Exercises   Other Exercises 1 Neuromuscular reeducation to address patient's ability to flex forward in preparation for sit to stand transition, or transfers into and out of wheelchair.  Patient with tendency to use excessive extension in trunk and hips to slide to edge of chair - working to activate thoracic extension,a nd forward trunk flexion from hips to come forward and unweight herself sufficiently to stand.  Patient's husband reported that patient was able to flex forward in wheelchair at home when preparing for a transfer.  Working to facilitate initiation of lift off from chair once weight shifted from femurs toward feet.  Patient ahs difficulty with motor initiation to begin to push into floor, but once started can activate hip and knee extension.  Patient needs max assist to achieve trunk extension in standing, and this frequently provokes an anxious response, and persistent motor behaviors.  Patient generally  responds well to calm voice, music, or even distraction at times.     Other Exercises 2 Seated at edge of mat table working to encourage active lateral trunk flexion, especially toward right side.  Patient responds well with cue to tip her ear toward shoulder before sidebending, or lateral weight shift.  Patient with best response when she understands movement requirements, and follows simple directional cueing.                  OT Education - 09/26/15 1352    Education provided Yes   Education Details Need for forward felxion in most every transitional movement.  Reaching forward versus pulling back with UE's   Person(s) Educated Patient;Spouse   Methods Explanation;Demonstration;Tactile  cues;Verbal cues   Comprehension Need further instruction          OT Short Term Goals - 09/26/15 1356      OT SHORT TERM GOAL #1   Title Pt and husband will be mod I with HEP - 10/21/2015   Status On-going     OT SHORT TERM GOAL #2   Title Pt will demonstrate adequate postural control for breath support to voice at least 3 words   Status On-going     OT SHORT TERM GOAL #3   Title Pt will transfer to commode with moderate assistance 75% of the time.   Status On-going     OT SHORT TERM GOAL #4   Title Pt will tolerate positional changes as evidenced by respiration rate and motor quieting in preparation for funcitonal mobility   Status On-going           OT Long Term Goals - 09/26/15 1357      OT LONG TERM GOAL #1   Title Pt and husband will be mod I with upgraded HEP - 11/18/2015   Status On-going     OT LONG TERM GOAL #2   Title Pt will demonstrate adequate postural control to support 5 words in supported sitting.   Status On-going     OT LONG TERM GOAL #3   Title Pt will demonstrate ability for low reach with RUE for functional task with  mod facilitation   Status On-going     OT LONG TERM GOAL #4   Title Pt will wash face with LUE with min a in supported sitting   Status On-going     OT LONG TERM GOAL #5   Title Pt will release objects with L hand with min faciltation    Status On-going               Plan - 09/26/15 1353    Clinical Impression Statement Patient with significant impairments which impede participation with ADL.  Patient responding well to simple but overt education regarding expectations for movement.     Rehab Potential Fair   Clinical Impairments Affecting Rehab Potential severity of deficits, slow rate of recovery   OT Frequency 3x / week   OT Duration 8 weeks   OT Treatment/Interventions Self-care/ADL training;Ultrasound;Moist Heat;Electrical Stimulation;DME and/or AE instruction;Neuromuscular education;Therapeutic  exercise;Functional Mobility Training;Manual Therapy;Passive range of motion;Splinting;Therapeutic activities;Balance training;Patient/family education;Visual/perceptual remediation/compensation;Cognitive remediation/compensation   Plan postural control in sitting and standing, functional use of UE's   OT Home Exercise Plan Discussed with patient, with husband present, the improtance of flexing forward and using legs for transfers and standing   Consulted and Agree with Plan of Care Patient;Family member/caregiver   Family Member Consulted husband [name]      Patient  will benefit from skilled therapeutic intervention in order to improve the following deficits and impairments:  Decreased mobility  Visit Diagnosis: Abnormal posture  Other lack of coordination  Unsteadiness on feet  Muscle weakness (generalized)  Stiffness of right shoulder, not elsewhere classified  Stiffness of left shoulder, not elsewhere classified  Frontal lobe and executive function deficit  Stiffness of left elbow, not elsewhere classified  Stiffness of left hip, not elsewhere classified    Problem List Patient Active Problem List   Diagnosis Date Noted  . 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 Movement disorder 09/22/2015  . Encephalitis 09/22/2015  . Chest pain 02/07/2014  . Premature atrial contractions 01/13/2014  . Abnormality of gait 12/04/2013  . Hyperlipidemia 12/21/2011  . Cardiovascular risk factor 12/21/2011  . Bunion 01/31/2008  . Metatarsalgia of both feet 09/20/2007  . FLAT FOOT 09/20/2007  . HALLUX RIGIDUS, ACQUIRED 09/20/2007    Mariah Milling 09/26/2015, 1:58 PM  [facility] [address] [place], [place], [address] Phone: [phone]   Fax:  [phone]  Name: Judy Spears MRN: [id number] Date of Birth: [birth year]

## 2015-09-26 NOTE — Progress Notes (Signed)
Subjective:    Patient ID: Judy Spears, female    DOB: 11/04/1950, 65 y.o.   MRN: [id number]  HPI Chief Complaint  Patient presents with  . Advice Only    Referred to establish pulmonary care.     This is a 65 year old female who in November 2016 was bitten by a tick and then within 2 weeks had a progressive neurologic illness. Specifically she was found unresponsive by her husband while working [place] of [place]. She was brought to local hospital where she required intubation due to unresponsiveness. Tracheostomy was performed on hospital day 2 and extensive workup for her neurologic illness begun. 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 She was then transferred for inpatient rehabilitation to the [facility] in [place]. While there she continued to participate in physical therapy and occupational therapy. However, during this time she was never able to consistently take by mouth and all nutrition has been provided the enteral feeding since around the time of her admission to the intensive care unit.  The tracheostomy has been in place since her original hospitalization in December 2016. It was briefly removed in May of 2017 but unfortunately she had a complication of laryngospasm and it sounds like the tracheostomy was replaced approximately 2 weeks later.  Her husband tells me today (he provides the entire history) that she also underwent tracheal dilation surgery for tracheal stenosis by an ear nose and throat physician in [place]. This apparently was also in May 2017.  He reports to me that the tracheostomy has been in place ever since May 2017 and has largely been capped for the duration of that time. He says that she has not had pneumonia fortunately. They have had periods of time when she needed more suctioning than others but most recently she is gone for nearly a month at a time  without requiring suctioning. In the last week she had a bit more secretions and he did have to suction her a few times but this was no more recent than 2 days ago.  He notes that she frequently will cough on her own but the hesitancy for removing the tracheostomy has been around the strength of her cough and her overall awareness of ability to manage oral secretions. It has been felt that the tracheostomy provide an avenue for pulmonary toilet to prevent pneumonia.  They relocated to [facility] within the last week and are continuing rehabilitation efforts here.  She has no baseline lung disease and was never a smoker.   Past Medical History:  Diagnosis Date  . Arthritis    lt great toe  . Complication of anesthesia    pt has had headaches post op-did advise to hydra well preop  . Hair loss    right-sided  . History of exercise stress test    a. ETT (12/15) with 12:00 exercise, no ST changes, occasional PACs.   . Hyperlipidemia   . Insomnia   . Migraine headache   . 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 Postmenopausal   . Premature atrial contractions    a. Holter (12/15) with 8% PACs, no atrial runs or atrial fibrillation.      Family History  Problem Relation Age of Onset  . Stroke Mother     paralyzed on right side/aphasia  . Hypertension Mother     had stroke after stopping BP meds  .  Colon cancer Father   . Prostate cancer Father   . Heart attack Maternal Grandfather 56  . Stroke Maternal Uncle 74     Social History   Social History  . Marital status: Married    Spouse name: N/A  . Number of children: N/A  . Years of education: N/A   Occupational History  . Not on file.   Social History Main Topics  . Smoking status: Never Smoker  . Smokeless tobacco: Never Used  . Alcohol use 0.0 oz/week     Comment: Occasional.  . Drug use: No  . Sexual activity: Yes    Birth control/ protection: Post-menopausal   Other Topics Concern  . Not on file    Social History Narrative  . No narrative on file     Allergies  Allergen Reactions  . Pollen Extract Other (See Comments)    Running nose  . Tetracyclines & Related     Blisters, photosensitivity     Outpatient Medications Prior to Visit  Medication Sig Dispense Refill  . amantadine (SYMMETREL) 50 MG/5ML solution Place 150 mg into feeding tube 2 (two) times daily.     . chlorhexidine (PERIDEX) 0.12 % solution Use as directed 15 mLs in the mouth or throat 2 (two) times daily.    . clonazePAM (KLONOPIN) 0.5 MG tablet Place 1 tablet (0.5 mg total) into feeding tube 2 (two) times daily. Add 0.25 mg tablet at noon in addition 60 tablet 3  . Coenzyme Q10 (COQ-10 PO) Give by tube.    . docusate (COLACE) 50 MG/5ML liquid Take 100 mg by mouth daily.    . fluticasone (FLONASE) 50 MCG/ACT nasal spray Place 2 sprays into both nostrils daily.     [name] gabapentin (NEURONTIN) 100 MG capsule Take 100 mg by mouth 2 (two) times daily.     . Lactobacillus Rhamnosus, GG, (PROBIOTIC COLIC) LIQD Take by mouth every morning.     . loratadine (CLARITIN) 10 MG tablet Place 10 mg into feeding tube daily.    [name] Nystatin POWD by Does not apply route.    . tobramycin-dexamethasone (TOBRADEX) ophthalmic solution Place 1 drop into both eyes 2 (two) times daily.     . traZODone (DESYREL) 50 MG tablet Place 50 mg into feeding tube at bedtime.     . valACYclovir (VALTREX) 500 MG tablet Place 500 mg into feeding tube daily.      No facility-administered medications prior to visit.       Review of Systems  Constitutional: Negative for fever and unexpected weight change.  HENT: Positive for congestion. Negative for dental problem, ear pain, nosebleeds, postnasal drip, rhinorrhea, sinus pressure, sneezing, sore throat and trouble swallowing.   Eyes: Negative for redness and itching.  Respiratory: Negative for cough, chest tightness, shortness of breath and wheezing.   Cardiovascular: Negative for palpitations and  leg swelling.  Gastrointestinal: Negative for nausea and vomiting.  Genitourinary: Negative for dysuria.  Musculoskeletal: Negative for joint swelling.  Skin: Negative for rash.  Neurological: Negative for headaches.  Hematological: Does not bruise/bleed easily.  Psychiatric/Behavioral: Negative for dysphoric mood. The patient is not nervous/anxious.        Objective:   Physical Exam  Vitals:   09/26/15 1343  BP: 112/64  Pulse: 77  SpO2: 95%   RA  Gen: chronically ill appearing in a wheelchair HENT: NCAT, OP clear, #4 cuffless tracheostomy tube in place, it is clean, dry and intact, no drainage Eyes: EOMi PULM: CTA B CV: RRR,  no mgr, no JVD GI: BS+, soft, nontender,  PEG in place Derm: thin skin, but no rash or skin breakdown MSK: Dimished bulk and no bony abnormalities Neuro: Awake, tracks with eyes, follows commands, frequent tremor most pronounced in legs, non-conversant\  No recent labwork or chest imaging in our system to review  I have reviewed the recent neurology and physical medicine and rehab notes     Assessment & Plan:  Tracheostomy status [facility]) Mrs. Zarcone has a #4 cuffless tracheostomy which has been in place for several weeks to months.  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  She also had a period of laryngospasm around May 2017 as well.  Fortunately it seems that these have not been problems since May and she has done well from a respiratory status since then.    At this point it's not clear that the tracheostomy is benefiting her.  The major barrier to decannulation at this point is her weak cough, but this seems to be improving steadily.  After some discussion with her husband today we decided that she seems to be on trach for decannulation in the next few weeks.  He would like to see her get stronger with rehab and have a more consistently strong cough prior to decannulation.  We  will plan to see her back in our tracheostomy clinic in 6 weeks and we will discuss decannulation then.  In the meantime I am going to request records from her pulmonary team and ENT team from [place].  Allergic rhinitis Continue flonase and loratadine.  35 minutes spent face to face, 70 minute visit   Current Outpatient Prescriptions:  .  amantadine (SYMMETREL) 50 MG/5ML solution, Place 150 mg into feeding tube 2 (two) times daily. , Disp: , Rfl:  .  chlorhexidine (PERIDEX) 0.12 % solution, Use as directed 15 mLs in the mouth or throat 2 (two) times daily., Disp: , Rfl:  .  clonazePAM (KLONOPIN) 0.5 MG tablet, Place 1 tablet (0.5 mg total) into feeding tube 2 (two) times daily. Add 0.25 mg tablet at noon in addition, Disp: 60 tablet, Rfl: 3 .  Coenzyme Q10 (COQ-10 PO), Give by tube., Disp: , Rfl:  .  docusate (COLACE) 50 MG/5ML liquid, Take 100 mg by mouth daily., Disp: , Rfl:  .  fluticasone (FLONASE) 50 MCG/ACT nasal spray, Place 2 sprays into both nostrils daily. , Disp: , Rfl:  .  gabapentin (NEURONTIN) 100 MG capsule, Take 100 mg by mouth 2 (two) times daily. , Disp: , Rfl:  .  Lactobacillus Rhamnosus, GG, (PROBIOTIC COLIC) LIQD, Take by mouth every morning. , Disp: , Rfl:  .  loratadine (CLARITIN) 10 MG tablet, Place 10 mg into feeding tube daily., Disp: , Rfl:  .  Nystatin POWD, by Does not apply route., Disp: , Rfl:  .  tobramycin-dexamethasone (TOBRADEX) ophthalmic solution, Place 1 drop into both eyes 2 (two) times daily. , Disp: , Rfl:  .  traZODone (DESYREL) 50 MG tablet, Place 50 mg into feeding tube at bedtime. , Disp: , Rfl:  .  valACYclovir (VALTREX) 500 MG tablet, Place 500 mg into feeding tube daily. , Disp: , Rfl:

## 2015-09-26 NOTE — Assessment & Plan Note (Signed)
Judy Spears has a #4 cuffless tracheostomy which has been in place for several weeks to months.  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  She also had a period of laryngospasm around May 2017 as well.  Fortunately it seems that these have not been problems since May and she has done well from a respiratory status since then.    At this point it's not clear that the tracheostomy is benefiting her.  The major barrier to decannulation at this point is her weak cough, but this seems to be improving steadily.  After some discussion with her husband today we decided that she seems to be on trach for decannulation in the next few weeks.  He would like to see her get stronger with rehab and have a more consistently strong cough prior to decannulation.  We will plan to see her back in our tracheostomy clinic in 6 weeks and we will discuss decannulation then.  In the meantime I am going to request records from her pulmonary team and ENT team from [place].

## 2015-09-27 NOTE — Therapy (Signed)
[facility] [address] [place], [place], [address] Phone: [phone]   Fax:  [phone]  Physical Therapy Treatment  Patient Details  Name: Judy Spears MRN: [id number] Date of Birth: August 20, 1950 Referring Provider: Alger Simons, MD Sharion Dove, MD)  Encounter Date: 09/26/2015      PT End of Session - 09/27/15 1133    Visit Number 3   Number of Visits 25   Date for PT Re-Evaluation 11/22/15   Authorization Type BCBS   PT Start Time [id number]   PT Stop Time 1230   PT Time Calculation (min) 45 min   Activity Tolerance Patient limited by fatigue   Behavior During Therapy [facility] for tasks assessed/performed      Past Medical History:  Diagnosis Date  . Arthritis    lt great toe  . Complication of anesthesia    pt has had headaches post op-did advise to hydra well preop  . Hair loss    right-sided  . History of exercise stress test    a. ETT (12/15) with 12:00 exercise, no ST changes, occasional PACs.   . Hyperlipidemia   . Insomnia   . Migraine headache   . 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 Postmenopausal   . Premature atrial contractions    a. Holter (12/15) with 8% PACs, no atrial runs or atrial fibrillation.     Past Surgical History:  Procedure Laterality Date  . BREAST BIOPSY  01/01/2011   Procedure: BREAST BIOPSY WITH NEEDLE LOCALIZATION;  Surgeon: Rolm Bookbinder, MD;  Location: [facility];  Service: General;  Laterality: Left;  left breast wire localization biopsy  . cataracts right eye    . CESAREAN SECTION  1986  . HERNIA REPAIR  2000   RIH  . opirectinal membrane peel    . RHINOPLASTY  1983    There were no vitals filed for this visit.      Subjective Assessment - 09/27/15 1113    Subjective Husband reports that since last therapy session, pt was able to initiate forward lean prior to transfer.   Patient is accompained by: Family member  husband, Judy Spears    Pertinent History Precautions: trach (capped), PEG, seizure. 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 No/denies                         [facility] Adult PT Treatment/Exercise - 09/27/15 0001      Transfers   Transfers Sit to Stand;Stand to Sit   Sit to Stand 2: Max assist   Sit to Stand Details (indicate cue type and reason) See NMR (below) for details.   Stand to Sit 3: Mod assist   Stand Pivot Transfers 2: Max assist   Stand Pivot Transfer Details (indicate cue type and reason) from elevated mat table > standard chair   Squat Pivot Transfers 3: Mod assist   Squat Pivot Transfer Details (indicate cue type and reason) From w/c <> mat table with max A; cueing for hand placement on target surface (mat table, w/c seat or arm rest); cueing, increased time for anterior weight shift. Pt initiating 25-50% of anterior weight shift adequate  to unload hips to transfer.      Posture/Postural Control   Posture Comments       Neuro Re-ed    Neuro Re-ed Details  Seated EOM with BLE's supported, therapist anterior to pt for cueing/to decrease pt fear of falling; other therapist seated on blue physioball behind providing intermittent cueing at B anterior shoulder girdles for upright posture. Physioball moved laterally in B directions to maintain thoracic extension with B forearm propping <> seated. After use of physioball, noted improved pt ability to maintain active thoracic spine extension during dynamic sitting and transfers. Performed multiple sit <> stand transfers from elevated mat table with therapist providing cueing, BUE support for upright posture. Cueing emphasized core muscular activation during  transfer setup (ant weight shift) and BLE muscle activation during sit > stand (as opposed to pulling with BUE's). Performed dynamic standing task with emphasis on active cervical/trunk rotation to R; however, quality of posture/movement in standing appeared to be limited by fatigue. During standing, pt with increasingly more L hip ER/ABD; when this PT attempted to manually stabilize, pt appeared uncomfortable.                  PT Short Term Goals - 09/23/15 2058      PT SHORT TERM GOAL #1   Title Pt will initiate anterior weight shift sufficient to unweight hips with supervision, 25% cueing in preparation for functional transfer.  (Target date: 10/21/15)     PT SHORT TERM GOAL #2   Title Pt will perform seated scooting (AP/PA and M/L) with supervision, 25% cueing to increase independence with transfers, repositioning. (10/21/15)     PT SHORT TERM GOAL #3   Title Pt will tolerate static standing with total A of standing frame x10 minutes with vital signs WNL to improve standing tolerance and for tone management.  (10/21/15)     PT SHORT TERM GOAL #4   Title Pt will perform dynamic sitting >/= 5 minutes with BLE's supported, intermittent UE support with CGA to indicate improved postural stability/control necessary for functional transfers. (10/21/15)     PT SHORT TERM GOAL #5   Title Pt will perform sit <> side lying with mod A, 50% cueing to progress toward increased independence with bed mobility.  (10/21/15)     Additional Short Term Goals   Additional Short Term Goals Yes     PT SHORT TERM GOAL #6   Title Pt will consistently perform sit <> stand with min A, 25% cueing to indicate increased independence with functional transfers.  (10/21/15)     PT SHORT TERM GOAL #7   Title Pt will perform level transfer from w/c <> mat table with mod A, 50% cueing to decrease caregiver burden with functional transfers.  (10/21/15)           PT Long Term Goals - 09/23/15 2120      PT LONG  TERM GOAL #1   Title Pt will perform supine > sit with mod A, 50% cueing to indicate increased independence with getting out of bed.  (Target: 11/18/15)     PT LONG TERM GOAL #2   Title Pt will perform sit > supine with mod A, 50% cueing to indicate increased independence with getting into bed.  (11/18/15)     PT LONG TERM GOAL #3   Title Pt will perform level transfer from w/c <> mat table with min A, 25% cueing to decrease caregiver burden with functional transfers.  (11/18/15)  PT LONG TERM GOAL #4   Title Pt will perform dynamic standing >/= 2 consecutive minutes with BUE support and mod, 50% cueing to indicate improved postural stanbility/control.   (11/18/15)     PT LONG TERM GOAL #5   Title Patient will ambulate x15' with max A of spouse using LRAD to enable pt/spouse to safely walk into/out of home bathroom.  (11/18/15)               Plan - 09/27/15 1133    Clinical Impression Statement Skilled session continued to emphasize initiation of, postural control with seated anterior weight shift, as this will be foundational to increasing pt independence with functional transfers, seated scooting, and independent pressure relief. Pt able to initiate about 25-50% of adequate ant weight shift adequately unload hips for transfer. With sit > stand, pt requires max multimodal cueing to activate B hip/knee extensor muscles when loading BLE's.   Rehab Potential Good   Clinical Impairments Affecting Rehab Potential Positive: strong family/social support; negative: chronicity of condition   PT Frequency 3x / week   PT Duration 8 weeks   PT Treatment/Interventions ADLs/Self Care Home Management;DME Instruction;Gait training;Stair training;Functional mobility training;Therapeutic activities;Patient/family education;Cognitive remediation;Neuromuscular re-education;Therapeutic exercise;Balance training;Orthotic Fit/Training;Wheelchair mobility training;Manual  techniques;Splinting;Visual/perceptual remediation/compensation;Vestibular;Passive range of motion   PT Next Visit Plan Pt responds well to music (light classical). If no skilled +2A available, try standing frame to control hyperkinesia, LE tone; progress to active B hip/knee extension (final 15-30 degrees), as appropriate   Consulted and Agree with Plan of Care Patient;Family member/caregiver   Family Member Consulted husband, Judy Spears      Patient will benefit from skilled therapeutic intervention in order to improve the following deficits and impairments:  Abnormal gait, Decreased balance, Decreased endurance, Decreased cognition, Cardiopulmonary status limiting activity, Decreased activity tolerance, Decreased coordination, Decreased strength, Impaired flexibility, Impaired tone, Decreased mobility, Decreased skin integrity, Increased muscle spasms, Impaired vision/preception, Decreased range of motion, Impaired UE functional use, Postural dysfunction, Pain  Visit Diagnosis: Other abnormalities of gait and mobility  Abnormal posture  Other lack of coordination  Unsteadiness on feet  Muscle weakness (generalized)     Problem List Patient Active Problem List   Diagnosis Date Noted  . Tracheostomy status ([place]) 09/26/2015  . Allergic rhinitis 09/26/2015  . 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 Movement disorder 09/22/2015  . Encephalitis 09/22/2015  . Chest pain 02/07/2014  . Premature atrial contractions 01/13/2014  . Abnormality of gait 12/04/2013  . Hyperlipidemia 12/21/2011  . Cardiovascular risk factor 12/21/2011  . Bunion 01/31/2008  . Metatarsalgia of both feet 09/20/2007  . FLAT FOOT 09/20/2007  . HALLUX RIGIDUS, ACQUIRED 09/20/2007   Billie Ruddy, PT, [facility] [address] [place], [place], [address] Phone: [phone]   Fax:  [phone] 09/27/15, 11:42 AM  Name: Judy Spears MRN: [id number] Date of Birth:  April 06, 1950

## 2015-09-30 ENCOUNTER — Ambulatory Visit: Payer: BC Managed Care – PPO | Admitting: Physical Therapy

## 2015-09-30 ENCOUNTER — Telehealth: Payer: Self-pay | Admitting: Physical Therapy

## 2015-09-30 ENCOUNTER — Telehealth: Payer: Self-pay

## 2015-09-30 ENCOUNTER — Ambulatory Visit: Payer: BC Managed Care – PPO

## 2015-09-30 DIAGNOSIS — A86 Unspecified viral encephalitis: Secondary | ICD-10-CM

## 2015-09-30 DIAGNOSIS — R293 Abnormal posture: Secondary | ICD-10-CM | POA: Diagnosis not present

## 2015-09-30 DIAGNOSIS — R471 Dysarthria and anarthria: Secondary | ICD-10-CM

## 2015-09-30 DIAGNOSIS — R41841 Cognitive communication deficit: Secondary | ICD-10-CM

## 2015-09-30 DIAGNOSIS — R1312 Dysphagia, oropharyngeal phase: Secondary | ICD-10-CM

## 2015-09-30 NOTE — Therapy (Addendum)
[facility] [address] [place], [place], [address] Phone: [phone]   Fax:  [phone]  Speech Language Pathology Treatment  Patient Details  Name: Judy Spears MRN: [id number] Date of Birth: 28-May-1950 Referring Provider: Dr. Alger Simons  Encounter Date: 09/30/2015      End of Session - 09/30/15 1549    Visit Number 4   Number of Visits 24   Date for SLP Re-Evaluation 11/18/15   SLP Start Time 1148   SLP Stop Time  1230   SLP Time Calculation (min) 42 min   Activity Tolerance Other (comment)  pt was more tearful today than previous sessions      Past Medical History:  Diagnosis Date  . Arthritis    lt great toe  . Complication of anesthesia    pt has had headaches post op-did advise to hydra well preop  . Hair loss    right-sided  . History of exercise stress test    a. ETT (12/15) with 12:00 exercise, no ST changes, occasional PACs.   . Hyperlipidemia   . Insomnia   . Migraine headache   . 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 Postmenopausal   . Premature atrial contractions    a. Holter (12/15) with 8% PACs, no atrial runs or atrial fibrillation.     Past Surgical History:  Procedure Laterality Date  . BREAST BIOPSY  01/01/2011   Procedure: BREAST BIOPSY WITH NEEDLE LOCALIZATION;  Surgeon: Rolm Bookbinder, MD;  Location: [facility];  Service: General;  Laterality: Left;  left breast wire localization biopsy  . cataracts right eye    . CESAREAN SECTION  1986  . HERNIA REPAIR  2000   RIH  . opirectinal membrane peel    . RHINOPLASTY  1983    There were no vitals filed for this visit.             ADULT SLP TREATMENT - 09/30/15 1510      General Information   Behavior/Cognition Cooperative;Agitated;Distractible;Requires cueing;Decreased sustained attention  tearful for ~60% of session     Treatment Provided   Treatment provided  Dysphagia;Cognitive-Linquistic     Dysphagia Treatment   Temperature Spikes Noted No   Oral Cavity - Dentition Adequate natural dentition   Treatment Methods Skilled observation;Therapeutic exercise;Patient/caregiver education   Patient observed directly with PO's No   Pharyngeal Phase Signs & Symptoms Delayed cough   Other treatment/comments SLP engaged pt in therapy to incr muscular activity for swallowing safely. Pt manipulated lemon swab with occasional min A from SLP (tactile, verbal) for lateral movement rt-lt. Pt expectorated x2 upon command with 4 opportunities to do so, given extra time and visual, verbal cues. Pt with reflexive swallow x3, and did not swallow on command. Educated pt's health aide, [name], about oral care best to do TID. [name] told pt that he completed with [name] and pt this morning "a lot".      Cognitive-Linquistic Treatment   Treatment focused on Dysarthria;Voice   Skilled Treatment SLP facilitated pt's verbalization/respiration for voice by hand over hand assist to raise arms and breathe appropriately for loud /a/, successful <25% of the time with 4 opportunities. Pt was less verbal today than Friday session last week, and tearful for most of the session. SLP simplified task to something more automatic/functional with loud "Hey," and pt was 20% successful with max visual/verbal/tactile cues. Spontaneous verbalization, or verbal expression in response to SLP questions (3/6  frequency) that pt attempted were with minimal oral movement, and intelligibility was <20%.      Assessment / Recommendations / Plan   Plan Continue with current plan of care     Progression Toward Goals   Progression toward goals Progressing toward goals          SLP Education - 09/30/15 1529    Education provided Yes   Education Details oral care TID recommended   Person(s) Educated Patient;Caregiver(s)   Methods Explanation          SLP Short Term Goals - 09/30/15 1640      SLP SHORT  TERM GOAL #1   Title Pt will demonstrate audible phonation with prolonged vowels and cv syllables 12/15 trials with occasional min A   Time 3   Period Weeks   Status On-going     SLP SHORT TERM GOAL #2   Title Pt will demonstrate breath support prior to repetition of words 8/10x   Time 3   Period Weeks   Status On-going     SLP SHORT TERM GOAL #3   Title Pt will swallow ice chip/H20 via spoon in less than 7 seconds with usual min to mod A 3/5 trials   Time 3   Period Weeks   Status On-going     SLP SHORT TERM GOAL #4   Title Pt will sustain attention to therapy task for over 7 minutes with occasional min A   Time 3   Period Weeks   Status On-going          SLP Long Term Goals - 09/30/15 1642      SLP LONG TERM GOAL #1   Title Pt will demonstrate audible phonation/intelligible speech with 2 word phrases with occasional min A 5/7 utterances   Time 7   Period Weeks   Status On-going     SLP LONG TERM GOAL #2   Title Pt will demonstrate audible cough/throat clear to command and to clear secretions with occasional min A 4/5 trials.    Time 7   Period Weeks   Status On-going     SLP LONG TERM GOAL #3   Title Pt will achieve timely oral phase and swallow with ice chip/ water trials 5/7 trials   Time 77   Period Weeks   Status On-going     SLP LONG TERM GOAL #4   Title Pt will sustain attention to therapy tasks for 12 minutes with occasiona min A   Time 7   Period Weeks   Status On-going          Plan - 09/30/15 1550    Clinical Impression Statement Pt req'd max A consistently with verbalizing loud speech and one word answers to simple questions.  Spontaneous speech was unable to be comprehended by SLP - req'd husband A. Pt somnolent during session x4 and req'd re-arousal. Pt would cont to benefit from skilled ST to address increasing verbal output and swalloiwng skills.   Speech Therapy Frequency 3x / week   Duration --  7 weeks or 21 visits    Treatment/Interventions Group dysphagia treatment;Pharyngeal strengthening exercises;Aspiration precaution training;Environmental controls;Compensatory techniques;Internal/external aids;Cognitive reorganization;Multimodal communcation approach;SLP instruction and feedback;Cueing hierarchy;Functional tasks;Patient/family education;Oral motor exercises   Potential to Achieve Goals Fair   Potential Considerations Ability to learn/carryover information;Co-morbidities;Severity of impairments   Consulted and Agree with Plan of Care Patient;Family member/caregiver   Family Member Consulted spouse, [name]      Patient will benefit from skilled therapeutic  intervention in order to improve the following deficits and impairments:   Dysarthria and anarthria  Dysphagia, oropharyngeal phase  Cognitive communication deficit    Problem List Patient Active Problem List   Diagnosis Date Noted  . Tracheostomy status ([place]) 09/26/2015  . Allergic rhinitis 09/26/2015  . 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 Movement disorder 09/22/2015  . Encephalitis 09/22/2015  . Chest pain 02/07/2014  . Premature atrial contractions 01/13/2014  . Abnormality of gait 12/04/2013  . Hyperlipidemia 12/21/2011  . Cardiovascular risk factor 12/21/2011  . Bunion 01/31/2008  . Metatarsalgia of both feet 09/20/2007  . FLAT FOOT 09/20/2007  . HALLUX RIGIDUS, ACQUIRED 09/20/2007    [facility] ,[place], CCC-SLP  09/30/2015, 4:43 PM  [facility] [address] [place], [place], [address] Phone: [phone]   Fax:  [phone]   Name: Judy Spears MRN: [id number] Date of Birth: [birth year]

## 2015-09-30 NOTE — Telephone Encounter (Signed)
Attempted to contact caregiver due to missed PT appt this morning at 8 am. Left voicemail asking that caregiver return call when message received.  Billie Ruddy, PT, DPT [facility] [address] [place], [place], [address] Phone: [phone]   Fax:  [phone] 09/30/15, 8:26 AM

## 2015-09-30 NOTE — Telephone Encounter (Signed)
Pt's husband, [name], called requesting a refill on her amantadine. Please advise on taking over those refills? He also would like to request her being "cleared" to get a facial treatment. Please advise.

## 2015-10-01 ENCOUNTER — Encounter: Payer: Self-pay | Admitting: Occupational Therapy

## 2015-10-01 ENCOUNTER — Ambulatory Visit: Payer: BC Managed Care – PPO | Admitting: Occupational Therapy

## 2015-10-01 ENCOUNTER — Ambulatory Visit: Payer: BC Managed Care – PPO | Admitting: Physical Therapy

## 2015-10-01 DIAGNOSIS — R293 Abnormal posture: Secondary | ICD-10-CM | POA: Diagnosis not present

## 2015-10-01 DIAGNOSIS — M25612 Stiffness of left shoulder, not elsewhere classified: Secondary | ICD-10-CM

## 2015-10-01 DIAGNOSIS — R2681 Unsteadiness on feet: Secondary | ICD-10-CM

## 2015-10-01 DIAGNOSIS — M6281 Muscle weakness (generalized): Secondary | ICD-10-CM

## 2015-10-01 DIAGNOSIS — M25622 Stiffness of left elbow, not elsewhere classified: Secondary | ICD-10-CM

## 2015-10-01 DIAGNOSIS — R41844 Frontal lobe and executive function deficit: Secondary | ICD-10-CM

## 2015-10-01 DIAGNOSIS — R2689 Other abnormalities of gait and mobility: Secondary | ICD-10-CM

## 2015-10-01 DIAGNOSIS — M25611 Stiffness of right shoulder, not elsewhere classified: Secondary | ICD-10-CM

## 2015-10-01 DIAGNOSIS — R278 Other lack of coordination: Secondary | ICD-10-CM

## 2015-10-01 MED ORDER — AMANTADINE HCL 50 MG/5ML PO SYRP: 150.0000 mg | ORAL_SOLUTION | Freq: Two times a day (BID) | ORAL | 4 refills | Status: DC

## 2015-10-01 NOTE — Therapy (Signed)
[facility] [address] [place], [place], [address] Phone: [phone]   Fax:  [phone]  Occupational Therapy Treatment  Patient Details  Name: Judy Spears MRN: [id number] Date of Birth: October 24, 1950 Referring Provider: Dr. Sharion Dove  Encounter Date: 10/01/2015      OT End of Session - 10/01/15 1626    Visit Number 4   Number of Visits 24   Date for OT Re-Evaluation 11/18/15   Authorization Type [employer] unlimited visits   OT Start Time [id number]   OT Stop Time 1530   OT Time Calculation (min) 45 min   Activity Tolerance Patient tolerated treatment well   Behavior During Therapy [facility] for tasks assessed/performed      Past Medical History:  Diagnosis Date  . Arthritis    lt great toe  . Complication of anesthesia    pt has had headaches post op-did advise to hydra well preop  . Hair loss    right-sided  . History of exercise stress test    a. ETT (12/15) with 12:00 exercise, no ST changes, occasional PACs.   . Hyperlipidemia   . Insomnia   . Migraine headache   . 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 Postmenopausal   . Premature atrial contractions    a. Holter (12/15) with 8% PACs, no atrial runs or atrial fibrillation.     Past Surgical History:  Procedure Laterality Date  . BREAST BIOPSY  01/01/2011   Procedure: BREAST BIOPSY WITH NEEDLE LOCALIZATION;  Surgeon: Rolm Bookbinder, MD;  Location: [facility];  Service: General;  Laterality: Left;  left breast wire localization biopsy  . cataracts right eye    . CESAREAN SECTION  1986  . HERNIA REPAIR  2000   RIH  . opirectinal membrane peel    . RHINOPLASTY  1983    There were no vitals filed for this visit.      Subjective Assessment - 10/01/15 1616    Subjective  Patient mouths hi at beginning of session   Patient is accompained by: Family member   Pertinent History see epic snapshot - ABI/hypoxia from [place]    Patient Stated Goals Pt unable to state.    Currently in Pain? No/denies                      OT Treatments/Exercises (OP) - 10/01/15 0001      Neurological Re-education Exercises   Other Exercises 1 Neuromuscular reeducation to address patient's ability to participate and volitionally control any aspect of transitional movement.  Patient seen for PT prior to this session.  Patient with increased tension in neck, rib cage, low back,a dn shoulder girdles.  Once able to activate mobility through head/neck, patient with improved trunk motion.  Patient able to assist with transfer from amt table to straight chair, by flexing trunk forward and grasping far arm of chair.  Patient shows much less resistance, anxiety with positional changes over last few sessions.  Once seated and supported, patient able to demonstrate isolated digit control with min assist.  Patient able to hold magazine, quiet hands, and turn pages.  patient rarely uses vision to engage with UE's, although she will sustain eye contact with people.  Need further investigation of visual abilities.                  OT Education - 10/01/15 1626    Education provided Yes  Education Details use of vision to guide hand function   Person(s) Educated Patient;Spouse;Caregiver(s)   Methods Explanation;Demonstration;Verbal cues;Tactile cues   Comprehension Need further instruction          OT Short Term Goals - 09/26/15 1356      OT SHORT TERM GOAL #1   Title Pt and husband will be mod I with HEP - 10/21/2015   Status On-going     OT SHORT TERM GOAL #2   Title Pt will demonstrate adequate postural control for breath support to voice at least 3 words   Status On-going     OT SHORT TERM GOAL #3   Title Pt will transfer to commode with moderate assistance 75% of the time.   Status On-going     OT SHORT TERM GOAL #4   Title Pt will tolerate positional changes as evidenced by respiration rate and motor quieting  in preparation for funcitonal mobility   Status On-going           OT Long Term Goals - 09/26/15 1357      OT LONG TERM GOAL #1   Title Pt and husband will be mod I with upgraded HEP - 11/18/2015   Status On-going     OT LONG TERM GOAL #2   Title Pt will demonstrate adequate postural control to support 5 words in supported sitting.   Status On-going     OT LONG TERM GOAL #3   Title Pt will demonstrate ability for low reach with RUE for functional task with  mod facilitation   Status On-going     OT LONG TERM GOAL #4   Title Pt will wash face with LUE with min a in supported sitting   Status On-going     OT LONG TERM GOAL #5   Title Pt will release objects with L hand with min faciltation    Status On-going               Plan - 10/01/15 1626    Clinical Impression Statement Patient showing slight improvement in head/neck and trunk mobility.  Patient does best when allowed some control of her own movement - needs time for motor response   Rehab Potential Fair   Clinical Impairments Affecting Rehab Potential severity of deficits, slow rate of recovery   OT Frequency 3x / week   OT Duration 8 weeks   OT Treatment/Interventions Self-care/ADL training;Ultrasound;Moist Heat;Electrical Stimulation;DME and/or AE instruction;Neuromuscular education;Therapeutic exercise;Functional Mobility Training;Manual Therapy;Passive range of motion;Splinting;Therapeutic activities;Balance training;Patient/family education;Visual/perceptual remediation/compensation;Cognitive remediation/compensation   Plan postural control in sitting, standing, rotation and flexion!  functional use of UE's   OT Home Exercise Plan Discussed with patient, with husband present, the improtance of flexing forward and using legs for transfers and standing   Consulted and Agree with Plan of Care Patient;Family member/caregiver   Family Member Consulted husband [name], caregiver [name]      Patient will benefit from  skilled therapeutic intervention in order to improve the following deficits and impairments:  Decreased endurance, Decreased skin integrity, Impaired vision/preception, Improper body mechanics, Impaired perceived functional ability, Decreased knowledge of precautions, Cardiopulmonary status limiting activity, Decreased activity tolerance, Decreased knowledge of use of DME, Decreased strength, Impaired flexibility, Improper spinal/pelvic alignment, Impaired sensation, Difficulty walking, Decreased mobility, Decreased balance, Decreased cognition, Decreased range of motion, Impaired tone, Pain, Impaired UE functional use, Decreased safety awareness, Decreased coordination  Visit Diagnosis: Abnormal posture  Other lack of coordination  Unsteadiness on feet  Muscle weakness (generalized)  Stiffness  of right shoulder, not elsewhere classified  Stiffness of left shoulder, not elsewhere classified  Frontal lobe and executive function deficit  Stiffness of left elbow, not elsewhere classified    Problem List Patient Active Problem List   Diagnosis Date Noted  . Tracheostomy status ([place]) 09/26/2015  . Allergic rhinitis 09/26/2015  . 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 Movement disorder 09/22/2015  . Encephalitis 09/22/2015  . Chest pain 02/07/2014  . Premature atrial contractions 01/13/2014  . Abnormality of gait 12/04/2013  . Hyperlipidemia 12/21/2011  . Cardiovascular risk factor 12/21/2011  . Bunion 01/31/2008  . Metatarsalgia of both feet 09/20/2007  . FLAT FOOT 09/20/2007  . HALLUX RIGIDUS, ACQUIRED 09/20/2007    Mariah Milling, OTR/L 10/01/2015, 4:29 PM  [facility] [address] [place], [place], [address] Phone: [phone]   Fax:  [phone]  Name: MARLAINA MATURINO MRN: [id number] Date of Birth: 26-Jul-1950

## 2015-10-01 NOTE — Therapy (Signed)
[facility] [address] [place], [place], [address] Phone: [phone]   Fax:  [phone]  Physical Therapy Treatment  Patient Details  Name: Judy Spears MRN: [id number] Date of Birth: 1950/06/01 Referring Provider: Alger Simons, MD Sharion Dove, MD)  Encounter Date: 10/01/2015      PT End of Session - 10/01/15 2134    Visit Number 4   Number of Visits 25   Date for PT Re-Evaluation 11/22/15   Authorization Type BCBS   PT Start Time [id number]   PT Stop Time 1445   PT Time Calculation (min) 43 min   Equipment Utilized During Treatment Other (comment)  standing frame   Activity Tolerance Patient tolerated treatment well   Behavior During Therapy [facility] for tasks assessed/performed      Past Medical History:  Diagnosis Date  . Arthritis    lt great toe  . Complication of anesthesia    pt has had headaches post op-did advise to hydra well preop  . Hair loss    right-sided  . History of exercise stress test    a. ETT (12/15) with 12:00 exercise, no ST changes, occasional PACs.   . Hyperlipidemia   . Insomnia   . Migraine headache   . 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 Postmenopausal   . Premature atrial contractions    a. Holter (12/15) with 8% PACs, no atrial runs or atrial fibrillation.     Past Surgical History:  Procedure Laterality Date  . BREAST BIOPSY  01/01/2011   Procedure: BREAST BIOPSY WITH NEEDLE LOCALIZATION;  Surgeon: Rolm Bookbinder, MD;  Location: [place];  Service: General;  Laterality: Left;  left breast wire localization biopsy  . cataracts right eye    . CESAREAN SECTION  1986  . HERNIA REPAIR  2000   RIH  . opirectinal membrane peel    . RHINOPLASTY  1983    There were no vitals filed for this visit.      Subjective Assessment - 10/01/15 2112    Subjective No significant changes to report.   Patient is accompained by: Family member  husband, [name]    Pertinent History Precautions: trach (capped), PEG, seizure. 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 No/denies                         [facility] Adult PT Treatment/Exercise - 10/01/15 0001      Transfers   Transfers Sit to Stand;Stand to Sit   Sit to Stand 2: Max assist;1: +1 Total assist   Sit to Stand Details (indicate cue type and reason) Sit <> partial stand x4 total; sit <> full standing with total A of standing frame (see NMR for detailsP. Initial 2 partial standing trials from elevated mat table, final trials from neutral height. Pt requires increased assist when B tibias not blocked. Cueing emphasized full anterior weight shift. Tactile cueing at B knees for increased WB, proprioceptive input. Intermittently required manual repositioning due to increased B LE adduction/ER.   Stand to Sit 3: Mod assist   Stand to Sit Details Cueing for full anterior weight shift, manual approximation/stabilization at B  tibias to maintain B knee flexion during transitional movement   Stand Pivot Transfers 2: Max Arts development officer Details (indicate cue type and reason) from w/c > mat table (w/c arm rest removed); manual facilitation of full anterior weight shift, for lateral weight shift during pivoting; verbal/tactile cueing for hand placement on mat table     Ambulation/Gait   Ambulation/Gait No     Neuro Re-ed    Neuro Re-ed Details  In static sitting, PT provided verbal, tactile, and demo cueing for pt to utilize RUE reaching for midline orientation. Pt engaged in the following activities while standing x17.5 consecutive minutes with total A of standing frame: RUE lateral reaching/WB for  midline orientation; functional RUE reaching to promote active shortening of trunk on R (pt able to concurrently perform cervical spine R rotation/lateral flexion with cueing); attempted bimanual task of folding wash cloths at midline, which was limited by pt difficulty unweighting forearms on standing frame table; with standing frame sling lowered slightly, pt actively performed final ~30 degrees of B hip/knee extension with mod A, cueing at B ribcage, iliac crests. Standing frame activities focused on tone management, addressing passive elongation of R trunk, postural control,  and grading/control of LE movement in extension.                   PT Short Term Goals - 09/23/15 2058      PT SHORT TERM GOAL #1   Title Pt will initiate anterior weight shift sufficient to unweight hips with supervision, 25% cueing in preparation for functional transfer.  (Target date: 10/21/15)     PT SHORT TERM GOAL #2   Title Pt will perform seated scooting (AP/PA and M/L) with supervision, 25% cueing to increase independence with transfers, repositioning. (10/21/15)     PT SHORT TERM GOAL #3   Title Pt will tolerate static standing with total A of standing frame x10 minutes with vital signs WNL to improve standing tolerance and for tone management.  (10/21/15)     PT SHORT TERM GOAL #4   Title Pt will perform dynamic sitting >/= 5 minutes with BLE's supported, intermittent UE support with CGA to indicate improved postural stability/control necessary for functional transfers. (10/21/15)     PT SHORT TERM GOAL #5   Title Pt will perform sit <> side lying with mod A, 50% cueing to progress toward increased independence with bed mobility.  (10/21/15)     Additional Short Term Goals   Additional Short Term Goals Yes     PT SHORT TERM GOAL #6   Title Pt will consistently perform sit <> stand with min A, 25% cueing to indicate increased independence with functional transfers.  (10/21/15)     PT SHORT TERM GOAL  #7   Title Pt will perform level transfer from w/c <> mat table with mod A, 50% cueing to decrease caregiver burden with functional transfers.  (10/21/15)           PT Long Term Goals - 09/23/15 2120      PT LONG TERM GOAL #1   Title Pt will perform supine > sit with mod A, 50% cueing to indicate increased independence with getting out of bed.  (Target: 11/18/15)     PT LONG TERM GOAL #2   Title Pt will perform sit > supine with mod A, 50% cueing to indicate increased independence with getting into bed.  (11/18/15)     PT LONG TERM GOAL #3  Title Pt will perform level transfer from w/c <> mat table with min A, 25% cueing to decrease caregiver burden with functional transfers.  (11/18/15)     PT LONG TERM GOAL #4   Title Pt will perform dynamic standing >/= 2 consecutive minutes with BUE support and mod, 50% cueing to indicate improved postural stanbility/control.   (11/18/15)     PT LONG TERM GOAL #5   Title Patient will ambulate x15' with max A of spouse using LRAD to enable pt/spouse to safely walk into/out of home bathroom.  (11/18/15)               Plan - 10/01/15 2135    Clinical Impression Statement Utilized standing frame for tone management, postural control, midline orientation. Pt tolerated 17.5 consecutive minutes of standing with total A of standing frame. Cueing, facilitation focused on upright posture, active cervical/thoracic spine extension during standing activities.   Rehab Potential Good   Clinical Impairments Affecting Rehab Potential Positive: strong family/social support; negative: chronicity of condition   PT Frequency 3x / week   PT Duration 8 weeks   PT Treatment/Interventions ADLs/Self Care Home Management;DME Instruction;Gait training;Stair training;Functional mobility training;Therapeutic activities;Patient/family education;Cognitive remediation;Neuromuscular re-education;Therapeutic exercise;Balance training;Orthotic Fit/Training;Wheelchair  mobility training;Manual techniques;Splinting;Visual/perceptual remediation/compensation;Vestibular;Passive range of motion   PT Next Visit Plan If skilled +2A available, consider quadruped.  If no skilled +2A, try bed mobility for trunk dissociation, active c-spine rotation to R, active shortening of R trunk in closed chain.   Consulted and Agree with Plan of Care Patient;Family member/caregiver   Family Member Consulted husband, [name]      Patient will benefit from skilled therapeutic intervention in order to improve the following deficits and impairments:  Abnormal gait, Decreased balance, Decreased endurance, Decreased cognition, Cardiopulmonary status limiting activity, Decreased activity tolerance, Decreased coordination, Decreased strength, Impaired flexibility, Impaired tone, Decreased mobility, Decreased skin integrity, Increased muscle spasms, Impaired vision/preception, Decreased range of motion, Impaired UE functional use, Postural dysfunction, Pain  Visit Diagnosis: Other abnormalities of gait and mobility  Abnormal posture  Other lack of coordination  Unsteadiness on feet     Problem List Patient Active Problem List   Diagnosis Date Noted  . Tracheostomy status ([place]) 09/26/2015  . Allergic rhinitis 09/26/2015  . 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 Movement disorder 09/22/2015  . Encephalitis 09/22/2015  . Chest pain 02/07/2014  . Premature atrial contractions 01/13/2014  . Abnormality of gait 12/04/2013  . Hyperlipidemia 12/21/2011  . Cardiovascular risk factor 12/21/2011  . Bunion 01/31/2008  . Metatarsalgia of both feet 09/20/2007  . FLAT FOOT 09/20/2007  . HALLUX RIGIDUS, ACQUIRED 09/20/2007    Billie Ruddy, PT, DPT [facility] [address] [place], [place], [address] Phone: [phone]   Fax:  [phone] 10/01/15, 9:44 PM  Name: Judy Spears MRN: [id number] Date of Birth: 12-Sep-1950

## 2015-10-01 NOTE — Telephone Encounter (Signed)
Left message to make pt's husband aware.

## 2015-10-01 NOTE — Telephone Encounter (Signed)
Refill sent to pharmacy.   

## 2015-10-02 ENCOUNTER — Ambulatory Visit: Payer: BC Managed Care – PPO | Admitting: Rehabilitation

## 2015-10-02 ENCOUNTER — Ambulatory Visit: Payer: BC Managed Care – PPO | Admitting: Speech Pathology

## 2015-10-02 ENCOUNTER — Encounter: Payer: Self-pay | Admitting: Rehabilitation

## 2015-10-02 DIAGNOSIS — R1312 Dysphagia, oropharyngeal phase: Secondary | ICD-10-CM

## 2015-10-02 DIAGNOSIS — R2689 Other abnormalities of gait and mobility: Secondary | ICD-10-CM

## 2015-10-02 DIAGNOSIS — R471 Dysarthria and anarthria: Secondary | ICD-10-CM

## 2015-10-02 DIAGNOSIS — R293 Abnormal posture: Secondary | ICD-10-CM | POA: Diagnosis not present

## 2015-10-02 DIAGNOSIS — R41841 Cognitive communication deficit: Secondary | ICD-10-CM

## 2015-10-02 DIAGNOSIS — R2681 Unsteadiness on feet: Secondary | ICD-10-CM

## 2015-10-02 DIAGNOSIS — M6281 Muscle weakness (generalized): Secondary | ICD-10-CM

## 2015-10-02 NOTE — Therapy (Signed)
[facility] [address] [place], [place], [address] Phone: [phone]   Fax:  [phone]  Physical Therapy Treatment  Patient Details  Name: Judy Spears MRN: [id number] Date of Birth: May 27, 1950 Referring Provider: Alger Simons, MD Sharion Dove, MD)  Encounter Date: 10/02/2015      PT End of Session - 10/02/15 1224    Visit Number 5   Number of Visits 25   Date for PT Re-Evaluation 11/22/15   Authorization Type BCBS   PT Start Time 0931   PT Stop Time 1015   PT Time Calculation (min) 44 min   Equipment Utilized During Treatment Other (comment)  standing frame   Activity Tolerance Patient tolerated treatment well   Behavior During Therapy [facility] for tasks assessed/performed      Past Medical History:  Diagnosis Date  . Arthritis    lt great toe  . Complication of anesthesia    pt has had headaches post op-did advise to hydra well preop  . Hair loss    right-sided  . History of exercise stress test    a. ETT (12/15) with 12:00 exercise, no ST changes, occasional PACs.   . Hyperlipidemia   . Insomnia   . Migraine headache   . 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 Postmenopausal   . Premature atrial contractions    a. Holter (12/15) with 8% PACs, no atrial runs or atrial fibrillation.     Past Surgical History:  Procedure Laterality Date  . BREAST BIOPSY  01/01/2011   Procedure: BREAST BIOPSY WITH NEEDLE LOCALIZATION;  Surgeon: Rolm Bookbinder, MD;  Location: [facility];  Service: General;  Laterality: Left;  left breast wire localization biopsy  . cataracts right eye    . CESAREAN SECTION  1986  . HERNIA REPAIR  2000   RIH  . opirectinal membrane peel    . RHINOPLASTY  1983    There were no vitals filed for this visit.      Subjective Assessment - 10/02/15 1223    Subjective Sister and husband report that pt woke at 2am and has been up since that time.    Patient is  accompained by: Family member   Pertinent History Precautions: trach (capped), PEG, seizure. 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 Other (Comment)  no s/s of pain during session                         [facility] Adult PT Treatment/Exercise - 10/02/15 1000      Transfers   Transfers Sit to Stand;Stand to Sit;Squat Pivot Transfers   Sit to Stand 1: +2 Total assist   Sit to Stand Details Tactile cues for initiation;Tactile cues for placement;Verbal cues for sequencing;Verbal cues for technique;Visual cues/gestures for sequencing;Manual facilitation for weight shifting;Manual facilitation for placement;Manual facilitation for weight bearing   Sit to Stand Details (indicate cue type and reason) Pt with decreased alertness during session and requries total verbal cues as well as max A to initiate forward weight shift.  She was unable to complete anterior transfer of weight, therefore PT provided tactile assist with BUEs and at trunk to increase  forward trunk lean.  Performed sit<>stand x 3 reps during session initially without support anteriorly (not facing mat), however pt with increased restlessness and decreased ability to acheive upright posture, therefore turned pt around to face mat for BUE support.  Attempted to engage pt in Hyperdash game for increased arousal.  She was able to follow cues to move towards target, however while demonstrating severe forward flexed posture and increased restlessness.  Upon attempted to provide manual facilitaiton at chest/trunk for posture, noted increased resistance for proprioceptive and perceptual feedback.  Also noted this at pelvis and knees as she resisted to  maintain forward flexed posture.     Stand to Sit 1: +1 Total assist   Stand to Sit Details (indicate cue type and reason) Manual facilitation for weight shifting;Tactile cues for weight shifting;Verbal cues for technique   Squat Pivot Transfers 1: +2 Total assist;With upper extremity assistance   Squat Pivot Transfer Details (indicate cue type and reason) Performed from w/c<>mat and again from w/c>arm chair with +2A.  Again note that pt unable to fully initiate forward trunk lean during session, therefore provided increased assist for this as well as assist to maintain feet in proper position and WB through LEs during transfer.  Pt did initiate reaching for arm rest on w/c on last transfer with RUE.  Assist to maintain placement.       Ambulation/Gait   Ambulation/Gait Yes   Ambulation/Gait Assistance 1: +2 Total assist   Ambulation/Gait Assistance Details Attempted gait during session with hopes of increased arousal.  Again, pt remained very forward flexed at trunk, hips and knees, despite assist from skilled PT on B axillas and assist from student anteriorly to assist with upright posture.  Attempted facilitation at hips with sheet, however pt continued to resist movement.  Pt assisted back to w/c at end of session.      Posture/Postural Control   Posture Comments Pt continues to demonstrate cervical spine and trunk laterally flexed to the L.  Unable to initaite correction as she has done in previous sessions due to increased fatigue.                   PT Short Term Goals - 09/23/15 2058      PT SHORT TERM GOAL #1   Title Pt will initiate anterior weight shift sufficient to unweight hips with supervision, 25% cueing in preparation for functional transfer.  (Target date: 10/21/15)     PT SHORT TERM GOAL #2   Title Pt will perform seated scooting (AP/PA and M/L) with supervision, 25% cueing to increase independence with transfers, repositioning. (10/21/15)     PT SHORT TERM GOAL #3    Title Pt will tolerate static standing with total A of standing frame x10 minutes with vital signs WNL to improve standing tolerance and for tone management.  (10/21/15)     PT SHORT TERM GOAL #4   Title Pt will perform dynamic sitting >/= 5 minutes with BLE's supported, intermittent UE support with CGA to indicate improved postural stability/control necessary for functional transfers. (10/21/15)     PT SHORT TERM GOAL #5   Title Pt will perform sit <> side lying with mod A, 50% cueing to progress toward increased independence with bed mobility.  (10/21/15)     Additional Short Term Goals   Additional Short Term Goals Yes     PT SHORT TERM GOAL #6   Title Pt will consistently perform sit <> stand with min A, 25% cueing  to indicate increased independence with functional transfers.  (10/21/15)     PT SHORT TERM GOAL #7   Title Pt will perform level transfer from w/c <> mat table with mod A, 50% cueing to decrease caregiver burden with functional transfers.  (10/21/15)           PT Long Term Goals - 09/23/15 2120      PT LONG TERM GOAL #1   Title Pt will perform supine > sit with mod A, 50% cueing to indicate increased independence with getting out of bed.  (Target: 11/18/15)     PT LONG TERM GOAL #2   Title Pt will perform sit > supine with mod A, 50% cueing to indicate increased independence with getting into bed.  (11/18/15)     PT LONG TERM GOAL #3   Title Pt will perform level transfer from w/c <> mat table with min A, 25% cueing to decrease caregiver burden with functional transfers.  (11/18/15)     PT LONG TERM GOAL #4   Title Pt will perform dynamic standing >/= 2 consecutive minutes with BUE support and mod, 50% cueing to indicate improved postural stanbility/control.   (11/18/15)     PT LONG TERM GOAL #5   Title Patient will ambulate x15' with max A of spouse using LRAD to enable pt/spouse to safely walk into/out of home bathroom.  (11/18/15)               Plan -  10/02/15 1225    Clinical Impression Statement Note pt very fatigued with decreased arousal during session, therefore attempted to have pt remain in standing position for increased arousal, working on upright posture with active cervical and thoracic extension, use of UEs during active task, again for increased arousal.  Note increased resistance today with movement causing increased flexed posture.     Rehab Potential Good   Clinical Impairments Affecting Rehab Potential Positive: strong family/social support; negative: chronicity of condition   PT Frequency 3x / week   PT Duration 8 weeks   PT Treatment/Interventions ADLs/Self Care Home Management;DME Instruction;Gait training;Stair training;Functional mobility training;Therapeutic activities;Patient/family education;Cognitive remediation;Neuromuscular re-education;Therapeutic exercise;Balance training;Orthotic Fit/Training;Wheelchair mobility training;Manual techniques;Splinting;Visual/perceptual remediation/compensation;Vestibular;Passive range of motion   PT Next Visit Plan If skilled +2A available, consider quadruped.  If no skilled +2A, try bed mobility for trunk dissociation, active c-spine rotation to R, active shortening of R trunk in closed chain.   Consulted and Agree with Plan of Care Patient;Family member/caregiver   Family Member Consulted husband, [name]      Patient will benefit from skilled therapeutic intervention in order to improve the following deficits and impairments:  Abnormal gait, Decreased balance, Decreased endurance, Decreased cognition, Cardiopulmonary status limiting activity, Decreased activity tolerance, Decreased coordination, Decreased strength, Impaired flexibility, Impaired tone, Decreased mobility, Decreased skin integrity, Increased muscle spasms, Impaired vision/preception, Decreased range of motion, Impaired UE functional use, Postural dysfunction, Pain  Visit Diagnosis: Other abnormalities of gait and  mobility  Abnormal posture  Unsteadiness on feet  Muscle weakness (generalized)     Problem List Patient Active Problem List   Diagnosis Date Noted  . Tracheostomy status ([place]) 09/26/2015  . Allergic rhinitis 09/26/2015  . 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 Movement disorder 09/22/2015  . Encephalitis 09/22/2015  . Chest pain 02/07/2014  . Premature atrial contractions 01/13/2014  . Abnormality of gait 12/04/2013  . Hyperlipidemia 12/21/2011  . Cardiovascular risk factor 12/21/2011  . Bunion 01/31/2008  . Metatarsalgia of both feet 09/20/2007  . FLAT FOOT  09/20/2007  . HALLUX RIGIDUS, ACQUIRED 09/20/2007    Cameron Sprang, PT, MPT [facility] [address] [place], [place], [address] Phone: [phone]   Fax:  [phone] 10/02/15, 12:42 PM  Name: Judy Spears MRN: [id number] Date of Birth: 1950/05/13

## 2015-10-02 NOTE — Therapy (Signed)
[facility] [address] [place], [place], [address] Phone: [phone]   Fax:  [phone]  Speech Language Pathology Treatment  Patient Details  Name: Judy Spears MRN: [id number] Date of Birth: [birth year] Referring Provider: Dr. Alger Simons  Encounter Date: 10/02/2015      End of Session - 10/02/15 1159    Visit Number 5   Number of Visits 24   Date for SLP Re-Evaluation 11/18/15   SLP Start Time 66   SLP Stop Time  1150   SLP Time Calculation (min) 48 min      Past Medical History:  Diagnosis Date  . Arthritis    lt great toe  . Complication of anesthesia    pt has had headaches post op-did advise to hydra well preop  . Hair loss    right-sided  . History of exercise stress test    a. ETT (12/15) with 12:00 exercise, no ST changes, occasional PACs.   . Hyperlipidemia   . Insomnia   . Migraine headache   . 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 Postmenopausal   . Premature atrial contractions    a. Holter (12/15) with 8% PACs, no atrial runs or atrial fibrillation.     Past Surgical History:  Procedure Laterality Date  . BREAST BIOPSY  01/01/2011   Procedure: BREAST BIOPSY WITH NEEDLE LOCALIZATION;  Surgeon: Rolm Bookbinder, MD;  Location: [facility];  Service: General;  Laterality: Left;  left breast wire localization biopsy  . cataracts right eye    . CESAREAN SECTION  1986  . HERNIA REPAIR  2000   RIH  . opirectinal membrane peel    . RHINOPLASTY  1983    There were no vitals filed for this visit.      Subjective Assessment - 10/02/15 1149    Subjective "This is the first time her eyes have been open today - we were here earlier and she didn't open her eyes"   Patient is accompained by: --  care giver, [name]   Currently in Pain? Other (Comment)               ADULT SLP TREATMENT - 10/02/15 1150      General Information   Behavior/Cognition  Cooperative;Agitated;Distractible;Requires cueing;Decreased sustained attention     Treatment Provided   Treatment provided Dysphagia;Cognitive-Linquistic     Dysphagia Treatment   Temperature Spikes Noted No   Treatment Methods Skilled observation;Therapeutic exercise;Patient/caregiver education   Pharyngeal Phase Signs & Symptoms Delayed cough;Wet vocal quality;Suspected delayed swallow initiation   Other treatment/comments ST facilitated oral movement and swallow with cold, wet spoon and lemon swalb. Pt required max A to manipulate swab using her jaw and tongue. She requied max verbal and tactile stim to elicit swallow on command 3/10x. Spontaneous swallow of saliva occurred 3x this session,.      Pain Assessment   Pain Assessment Faces   Pain Score 0-No pain     Cognitive-Linquistic Treatment   Treatment focused on Dysarthria;Voice   Skilled Treatment Breath support for phonation facilitated with max h/h cues for ihalation. Phonation after inhalation achieved with "Hey" and "Heels" 6/15x. Alertness variable -  pt required usual tactile and verbal cues to sustain attnetion and alerntess. At most awake moments, pt verbalized "Hey" 5x in a row with stronger voice and counted to 10 with weak phonation and max visual and verbal cues.      Assessment / Recommendations /  Plan   Plan Continue with current plan of care     Progression Toward Goals   Progression toward goals Progressing toward goals          SLP Education - 10/02/15 1156    Education provided Yes          SLP Short Term Goals - 10/02/15 1159      SLP SHORT TERM GOAL #1   Title Pt will demonstrate audible phonation with prolonged vowels and cv syllables 12/15 trials with occasional min A   Time 3   Period Weeks   Status On-going     SLP SHORT TERM GOAL #2   Title Pt will demonstrate breath support prior to repetition of words 8/10x   Time 3   Period Weeks   Status On-going     SLP SHORT TERM GOAL #3    Title Pt will swallow ice chip/H20 via spoon in less than 7 seconds with usual min to mod A 3/5 trials   Time 3   Period Weeks   Status On-going     SLP SHORT TERM GOAL #4   Title Pt will sustain attention to therapy task for over 7 minutes with occasional min A   Time 3   Period Weeks   Status On-going          SLP Long Term Goals - 10/02/15 1159      SLP LONG TERM GOAL #1   Title Pt will demonstrate audible phonation/intelligible speech with 2 word phrases with occasional min A 5/7 utterances   Time 7   Period Weeks   Status On-going     SLP LONG TERM GOAL #2   Title Pt will demonstrate audible cough/throat clear to command and to clear secretions with occasional min A 4/5 trials.    Time 7   Period Weeks   Status On-going     SLP LONG TERM GOAL #3   Title Pt will achieve timely oral phase and swallow with ice chip/ water trials 5/7 trials   Time 77   Period Weeks   Status On-going     SLP LONG TERM GOAL #4   Title Pt will sustain attention to therapy tasks for 12 minutes with occasiona min A   Time 7   Period Weeks   Status On-going          Plan - 10/02/15 1157    Clinical Impression Statement Pt req'd max A consistently with verbalizing loud speech and rote responses to simple questions.  Spontaneous speech was unintellgible and inaudible  -  Pt somnolent during session x4 and req'd re-arousal. Pt would cont to benefit from skilled ST to address increasing verbal output and swalloiwng skills.   Speech Therapy Frequency 3x / week   Treatment/Interventions Group dysphagia treatment;Pharyngeal strengthening exercises;Aspiration precaution training;Environmental controls;Compensatory techniques;Internal/external aids;Cognitive reorganization;Multimodal communcation approach;SLP instruction and feedback;Cueing hierarchy;Functional tasks;Patient/family education;Oral motor exercises   Potential to Achieve Goals Fair   Potential Considerations Ability to  learn/carryover information;Co-morbidities;Severity of impairments   Consulted and Agree with Plan of Care Family member/caregiver   Family Member Consulted [name] - caregiver      Patient will benefit from skilled therapeutic intervention in order to improve the following deficits and impairments:   Dysphagia, oropharyngeal phase  Dysarthria and anarthria  Cognitive communication deficit    Problem List Patient Active Problem List   Diagnosis Date Noted  . Tracheostomy status ([facility]) 09/26/2015  . Allergic rhinitis 09/26/2015  . 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  Movement disorder 09/22/2015  . Encephalitis 09/22/2015  . Chest pain 02/07/2014  . Premature atrial contractions 01/13/2014  . Abnormality of gait 12/04/2013  . Hyperlipidemia 12/21/2011  . Cardiovascular risk factor 12/21/2011  . Bunion 01/31/2008  . Metatarsalgia of both feet 09/20/2007  . FLAT FOOT 09/20/2007  . HALLUX RIGIDUS, ACQUIRED 09/20/2007    Luci Bellucci, Annye Rusk MS, CCC-SLP 10/02/2015, 11:59 AM  [facility] [address] [place], [place], [address] Phone: [phone]   Fax:  [phone]   Name: Judy Spears MRN: [id number] Date of Birth: March 12, 1950

## 2015-10-03 ENCOUNTER — Ambulatory Visit: Payer: BC Managed Care – PPO | Admitting: Physical Therapy

## 2015-10-03 DIAGNOSIS — R293 Abnormal posture: Secondary | ICD-10-CM | POA: Diagnosis not present

## 2015-10-03 DIAGNOSIS — R2689 Other abnormalities of gait and mobility: Secondary | ICD-10-CM

## 2015-10-03 DIAGNOSIS — M6281 Muscle weakness (generalized): Secondary | ICD-10-CM

## 2015-10-03 DIAGNOSIS — R278 Other lack of coordination: Secondary | ICD-10-CM

## 2015-10-03 NOTE — Therapy (Signed)
[facility] [address] [place], [place], [address] Phone: [phone]   Fax:  [phone]  Physical Therapy Treatment  Patient Details  Name: Judy Spears MRN: [id number] Date of Birth: 1950-12-06 Referring Provider: Alger Simons, MD Sharion Dove, MD)  Encounter Date: 10/03/2015      PT End of Session - 10/03/15 1959    Visit Number 6   Number of Visits 25   Date for PT Re-Evaluation 11/22/15   Authorization Type [employer]   PT Start Time 0850   PT Stop Time 0934   PT Time Calculation (min) 44 min   Activity Tolerance Patient tolerated treatment well   Behavior During Therapy [facility] for tasks assessed/performed      Past Medical History:  Diagnosis Date  . Arthritis    lt great toe  . Complication of anesthesia    pt has had headaches post op-did advise to hydra well preop  . Hair loss    right-sided  . History of exercise stress test    a. ETT (12/15) with 12:00 exercise, no ST changes, occasional PACs.   . Hyperlipidemia   . Insomnia   . Migraine headache   . 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 Postmenopausal   . Premature atrial contractions    a. Holter (12/15) with 8% PACs, no atrial runs or atrial fibrillation.     Past Surgical History:  Procedure Laterality Date  . BREAST BIOPSY  01/01/2011   Procedure: BREAST BIOPSY WITH NEEDLE LOCALIZATION;  Surgeon: Rolm Bookbinder, MD;  Location: [facility];  Service: General;  Laterality: Left;  left breast wire localization biopsy  . cataracts right eye    . CESAREAN SECTION  1986  . HERNIA REPAIR  2000   RIH  . opirectinal membrane peel    . RHINOPLASTY  1983    There were no vitals filed for this visit.      Subjective Assessment - 10/03/15 1245    Subjective Per nurse, "She didn't sleep much last night, either...but she's more awake today than she was yesterday."   Patient is accompained by: Family member  husband, [name];  daughter, Jeanett Schlein; and nurse   Pertinent History Precautions: trach (capped), PEG, seizure. 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 No/denies                         [facility] Adult PT Treatment/Exercise - 10/03/15 0001      Bed Mobility   Bed Mobility Supine to Sit;Sit to Supine;Sitting - Scoot to Edge of Bed   Supine to Sit 3: Mod assist;2: Max assist   Supine to Sit Details (indicate cue type and reason) See NMR below for details on cueing/facilitation provided   Sitting - Scoot to Edge of Bed 3: Mod assist   Sitting - Scoot to Edge of Bed Details (indicate cue type and reason) cueing for full anterior weight shift; manual stabilization/blocking at anterior aspect of B tibias   Sit to Supine 2: Max assist   Sit to Supine - Details (indicate cue type and reason) See NMR for details on cueing/facilitation provided     Transfers  Transfers Sit to Stand;Stand to Sit   Sit to Stand 2: Max assist;3: Mod assist;1: +2 Total assist   Sit to Stand Details (indicate cue type and reason) x2 trials From elevated mat table with +2A to maximize effectiveness of cueing; PT (anterior to pt) providing B HHA for upright posture; cueing focused on initiation of full anterior weight shift via head/trunk movement (as opposed to pulling with BUE's); rehab tech (posterior to pt) provided tactile cueing/assist at B ribcage. Once in partial standing position, cueing emphasized full active extension of B knees.   Sit to Stand: Patient Percentage 50%   Stand to Sit 3: Mod assist   Stand to Sit Details (indicate cue type and reason) Manual facilitation for weight shifting;Tactile cues for weight shifting;Verbal cues  for technique   Squat Pivot Transfers 2: Max assist;1: +2 Total assist   Squat Pivot Transfer Details (indicate cue type and reason) from w/c <> mat table; cueing emphasized full anterior weight shift during sit <> stand; manual stabilization/repositioning of B feet required due to LE tone causing LE adduction/ER.     Ambulation/Gait   Ambulation/Gait No     Neuro Re-ed    Neuro Re-ed Details  Pt performed blocked practice of supine <> sit to R side for increased pelvic dissociation, WB on R side (for activation, kinesthetic awareness, and proprioceptive input), for active closed-chain shortening of trunk on R side, and active cervical spine rotation. Utilized logroll technique. Required increased time, max multimodal cueing, manual facilitation at trunk to initiate sit > R side lying (PT managing BLE's). Mod-max multimodal cueing also required for LUE movement across midline to initiate supine > R side lying; frequent verbal reminders provided for visual fixation on hand to facilitate cervical spine rotation to R.  For R side lying > sit, pt required cueing and increased time to advance BLE's toward EOM; min A to maintain trunk position (to prevent posterior LOB) during transition from R side lying > sit.                PT Education - 10/03/15 1246    Education provided Yes   Education Details bed mobility: technique, cueing   Person(s) Educated Patient;Spouse;Caregiver(s)   Methods Explanation;Demonstration   Comprehension Verbalized understanding;Need further instruction          PT Short Term Goals - 09/23/15 2058      PT SHORT TERM GOAL #1   Title Pt will initiate anterior weight shift sufficient to unweight hips with supervision, 25% cueing in preparation for functional transfer.  (Target date: 10/21/15)     PT SHORT TERM GOAL #2   Title Pt will perform seated scooting (AP/PA and M/L) with supervision, 25% cueing to increase independence with transfers, repositioning.  (10/21/15)     PT SHORT TERM GOAL #3   Title Pt will tolerate static standing with total A of standing frame x10 minutes with vital signs WNL to improve standing tolerance and for tone management.  (10/21/15)     PT SHORT TERM GOAL #4   Title Pt will perform dynamic sitting >/= 5 minutes with BLE's supported, intermittent UE support with CGA to indicate improved postural stability/control necessary for functional transfers. (10/21/15)     PT SHORT TERM GOAL #5   Title Pt will perform sit <> side lying with mod A, 50% cueing to progress toward increased independence with bed mobility.  (10/21/15)     Additional Short Term Goals   Additional Short Term Goals Yes  PT SHORT TERM GOAL #6   Title Pt will consistently perform sit <> stand with min A, 25% cueing to indicate increased independence with functional transfers.  (10/21/15)     PT SHORT TERM GOAL #7   Title Pt will perform level transfer from w/c <> mat table with mod A, 50% cueing to decrease caregiver burden with functional transfers.  (10/21/15)           PT Long Term Goals - 09/23/15 2120      PT LONG TERM GOAL #1   Title Pt will perform supine > sit with mod A, 50% cueing to indicate increased independence with getting out of bed.  (Target: 11/18/15)     PT LONG TERM GOAL #2   Title Pt will perform sit > supine with mod A, 50% cueing to indicate increased independence with getting into bed.  (11/18/15)     PT LONG TERM GOAL #3   Title Pt will perform level transfer from w/c <> mat table with min A, 25% cueing to decrease caregiver burden with functional transfers.  (11/18/15)     PT LONG TERM GOAL #4   Title Pt will perform dynamic standing >/= 2 consecutive minutes with BUE support and mod, 50% cueing to indicate improved postural stanbility/control.   (11/18/15)     PT LONG TERM GOAL #5   Title Patient will ambulate x15' with max A of spouse using LRAD to enable pt/spouse to safely walk into/out of home bathroom.   (11/18/15)               Plan - 10/03/15 1959    Clinical Impression Statement Session focused on bed mobility for NMR with emphasis on  trunk dissociation, active c-spine rotation to R, active shortening of R trunk in closed chain. Pt arousal much improved today as compared with previous session. After bed mobility training, noted improved postural alignment; specifically, pt exhibited less cervical/thoracic L lateral flexion and increased thoracic/cervical extension.    Rehab Potential Good   Clinical Impairments Affecting Rehab Potential Positive: strong family/social support; negative: chronicity of condition   PT Frequency 3x / week   PT Duration 8 weeks   PT Treatment/Interventions ADLs/Self Care Home Management;DME Instruction;Gait training;Stair training;Functional mobility training;Therapeutic activities;Patient/family education;Cognitive remediation;Neuromuscular re-education;Therapeutic exercise;Balance training;Orthotic Fit/Training;Wheelchair mobility training;Manual techniques;Splinting;Visual/perceptual remediation/compensation;Vestibular;Passive range of motion   PT Next Visit Plan If skilled +2A available: quadruped, gait with AFO's, physioball for pelvic mobility/dissociation.  If no skilled +2A: standing frame, partial standing with BUE support, sit <> stand, seated scooting, supine bridging to scoot, seated reaching for anterior weight shift, or bed mobility.   Consulted and Agree with Plan of Care Patient;Family member/caregiver   Family Member Consulted husband, [name] and daughter, Jeanett Schlein      Patient will benefit from skilled therapeutic intervention in order to improve the following deficits and impairments:  Abnormal gait, Decreased balance, Decreased endurance, Decreased cognition, Cardiopulmonary status limiting activity, Decreased activity tolerance, Decreased coordination, Decreased strength, Impaired flexibility, Impaired tone, Decreased mobility, Decreased skin  integrity, Increased muscle spasms, Impaired vision/preception, Decreased range of motion, Impaired UE functional use, Postural dysfunction, Pain  Visit Diagnosis: Other abnormalities of gait and mobility  Abnormal posture  Other lack of coordination  Muscle weakness (generalized)     Problem List Patient Active Problem List   Diagnosis Date Noted  . Tracheostomy status ([place]) 09/26/2015  . Allergic rhinitis 09/26/2015  . 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 Movement disorder 09/22/2015  . Encephalitis 09/22/2015  . Chest  pain 02/07/2014  . Premature atrial contractions 01/13/2014  . Abnormality of gait 12/04/2013  . Hyperlipidemia 12/21/2011  . Cardiovascular risk factor 12/21/2011  . Bunion 01/31/2008  . Metatarsalgia of both feet 09/20/2007  . FLAT FOOT 09/20/2007  . HALLUX RIGIDUS, ACQUIRED 09/20/2007    Billie Ruddy, PT, DPT [facility] [address] [name], [place], [address] Phone: [phone]   Fax:  [phone] 10/03/15, 8:07 PM  Name: Judy Spears MRN: [id number] Date of Birth: [birth year]

## 2015-10-06 ENCOUNTER — Ambulatory Visit: Payer: BC Managed Care – PPO

## 2015-10-06 ENCOUNTER — Ambulatory Visit: Payer: BC Managed Care – PPO | Admitting: Rehabilitation

## 2015-10-06 ENCOUNTER — Encounter: Payer: Self-pay | Admitting: Occupational Therapy

## 2015-10-06 ENCOUNTER — Ambulatory Visit: Payer: BC Managed Care – PPO | Admitting: Occupational Therapy

## 2015-10-06 ENCOUNTER — Telehealth: Payer: Self-pay | Admitting: Physical Medicine & Rehabilitation

## 2015-10-06 DIAGNOSIS — M25611 Stiffness of right shoulder, not elsewhere classified: Secondary | ICD-10-CM

## 2015-10-06 DIAGNOSIS — R471 Dysarthria and anarthria: Secondary | ICD-10-CM

## 2015-10-06 DIAGNOSIS — R1312 Dysphagia, oropharyngeal phase: Secondary | ICD-10-CM

## 2015-10-06 DIAGNOSIS — R41844 Frontal lobe and executive function deficit: Secondary | ICD-10-CM

## 2015-10-06 DIAGNOSIS — R278 Other lack of coordination: Secondary | ICD-10-CM

## 2015-10-06 DIAGNOSIS — R2681 Unsteadiness on feet: Secondary | ICD-10-CM

## 2015-10-06 DIAGNOSIS — R293 Abnormal posture: Secondary | ICD-10-CM

## 2015-10-06 DIAGNOSIS — R41841 Cognitive communication deficit: Secondary | ICD-10-CM

## 2015-10-06 DIAGNOSIS — M25622 Stiffness of left elbow, not elsewhere classified: Secondary | ICD-10-CM

## 2015-10-06 DIAGNOSIS — A86 Unspecified viral encephalitis: Secondary | ICD-10-CM

## 2015-10-06 DIAGNOSIS — M6281 Muscle weakness (generalized): Secondary | ICD-10-CM

## 2015-10-06 DIAGNOSIS — M25612 Stiffness of left shoulder, not elsewhere classified: Secondary | ICD-10-CM

## 2015-10-06 NOTE — Therapy (Signed)
[facility] [address] [place], [place], [address] Phone: [phone]   Fax:  [phone]  Occupational Therapy Treatment  Patient Details  Name: Judy Spears MRN: [id number] Date of Birth: 10-27-1950 Referring Provider: Dr. Sharion Dove  Encounter Date: 10/06/2015      OT End of Session - 10/06/15 1524    Visit Number 5   Number of Visits 24   Date for OT Re-Evaluation 11/18/15   Authorization Type BCBS unlimited visits   OT Start Time 1230   OT Stop Time 1320   OT Time Calculation (min) 50 min   Activity Tolerance Patient tolerated treatment well   Behavior During Therapy [facility] for tasks assessed/performed      Past Medical History:  Diagnosis Date  . Arthritis    lt great toe  . Complication of anesthesia    pt has had headaches post op-did advise to hydra well preop  . Hair loss    right-sided  . History of exercise stress test    a. ETT (12/15) with 12:00 exercise, no ST changes, occasional PACs.   . Hyperlipidemia   . Insomnia   . Migraine headache   . 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 Postmenopausal   . Premature atrial contractions    a. Holter (12/15) with 8% PACs, no atrial runs or atrial fibrillation.     Past Surgical History:  Procedure Laterality Date  . BREAST BIOPSY  01/01/2011   Procedure: BREAST BIOPSY WITH NEEDLE LOCALIZATION;  Surgeon: Rolm Bookbinder, MD;  Location: [facility];  Service: General;  Laterality: Left;  left breast wire localization biopsy  . cataracts right eye    . CESAREAN SECTION  1986  . HERNIA REPAIR  2000   RIH  . opirectinal membrane peel    . RHINOPLASTY  1983    There were no vitals filed for this visit.      Subjective Assessment - 10/06/15 1518    Subjective  Patient was difficult to arouse during SLP session prior   Patient is accompained by: Family member   Pertinent History see epic snapshot - ABI/hypoxia from [place]   Patient Stated Goals Pt unable to state.    Currently in Pain? No/denies   Multiple Pain Sites No                      OT Treatments/Exercises (OP) - 10/06/15 0001      ADLs   ADL Comments Reviewed OT short and long term goals with patient's husband, in patient's presence.  Rveiewed initial plan for 3x/wee for 8 weeks, and potential to continue for additional 8 weeks based on progress toward goals.  Need to establish exercise program for home.       Neurological Re-education Exercises   Other Exercises 1 Worked in parallel bars to address sit to stand transition.  Used knee strap to support alignment in knee extension, and worked to increase hip and thoracic extension.  Emphasized more aligned strating posture - patient strongly biased toward her left side, but able to correct to near midline with increased time, and physical, and visual cueing.  Patient does best with cue to tip her head toward right to unlock lateral shortening pattern.  Patient with better ability to initiate lift off from chair, yet difficult for sustained motor activation for full standing.  Patient tends to offer brief bursts of activity when more  prolonged activity required.  Patient able to stand in alignment with legs active, and not jumping, for ~ one minute.  Patient smiling during this transition, although very fatigued by end of session.                  OT Education - 10/06/15 1524    Education provided Yes   Education Details reviewed OT goals and plan of care   Person(s) Educated Patient;Spouse;Caregiver(s)   Methods Explanation   Comprehension Verbalized understanding          OT Short Term Goals - 09/26/15 1356      OT SHORT TERM GOAL #1   Title Pt and husband will be mod I with HEP - 10/21/2015   Status On-going     OT SHORT TERM GOAL #2   Title Pt will demonstrate adequate postural control for breath support to voice at least 3 words   Status On-going     OT  SHORT TERM GOAL #3   Title Pt will transfer to commode with moderate assistance 75% of the time.   Status On-going     OT SHORT TERM GOAL #4   Title Pt will tolerate positional changes as evidenced by respiration rate and motor quieting in preparation for funcitonal mobility   Status On-going           OT Long Term Goals - 09/26/15 1357      OT LONG TERM GOAL #1   Title Pt and husband will be mod I with upgraded HEP - 11/18/2015   Status On-going     OT LONG TERM GOAL #2   Title Pt will demonstrate adequate postural control to support 5 words in supported sitting.   Status On-going     OT LONG TERM GOAL #3   Title Pt will demonstrate ability for low reach with RUE for functional task with  mod facilitation   Status On-going     OT LONG TERM GOAL #4   Title Pt will wash face with LUE with min a in supported sitting   Status On-going     OT LONG TERM GOAL #5   Title Pt will release objects with L hand with min faciltation    Status On-going               Plan - 10/06/15 1524    Clinical Impression Statement Patient with severe deficits, yet she is able to actively participate every session, with intermittent rest periods.  Patient has excellent family support.     Rehab Potential Fair   Clinical Impairments Affecting Rehab Potential severity of deficits, slow rate of recovery   OT Frequency 3x / week   OT Duration 8 weeks   OT Treatment/Interventions Self-care/ADL training;Ultrasound;Moist Heat;Electrical Stimulation;DME and/or AE instruction;Neuromuscular education;Therapeutic exercise;Functional Mobility Training;Manual Therapy;Passive range of motion;Splinting;Therapeutic activities;Balance training;Patient/family education;Visual/perceptual remediation/compensation;Cognitive remediation/compensation   Plan postural control in sitting, standing, rotation and flexion, functional use of arms   Consulted and Agree with Plan of Care Patient;Family member/caregiver    Family Member Consulted husband [name], caregiver [name]      Patient will benefit from skilled therapeutic intervention in order to improve the following deficits and impairments:  Decreased endurance, Decreased skin integrity, Impaired vision/preception, Improper body mechanics, Impaired perceived functional ability, Decreased knowledge of precautions, Cardiopulmonary status limiting activity, Decreased activity tolerance, Decreased knowledge of use of DME, Decreased strength, Impaired flexibility, Improper spinal/pelvic alignment, Impaired sensation, Difficulty walking, Decreased mobility, Decreased balance, Decreased cognition, Decreased range of  motion, Impaired tone, Pain, Impaired UE functional use, Decreased safety awareness, Decreased coordination  Visit Diagnosis: Abnormal posture  Other lack of coordination  Muscle weakness (generalized)  Unsteadiness on feet  Stiffness of right shoulder, not elsewhere classified  Stiffness of left shoulder, not elsewhere classified  Frontal lobe and executive function deficit  Stiffness of left elbow, not elsewhere classified    Problem List Patient Active Problem List   Diagnosis Date Noted  . Tracheostomy status ([place]) 09/26/2015  . Allergic rhinitis 09/26/2015  . 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 Movement disorder 09/22/2015  . Encephalitis 09/22/2015  . Chest pain 02/07/2014  . Premature atrial contractions 01/13/2014  . Abnormality of gait 12/04/2013  . Hyperlipidemia 12/21/2011  . Cardiovascular risk factor 12/21/2011  . Bunion 01/31/2008  . Metatarsalgia of both feet 09/20/2007  . FLAT FOOT 09/20/2007  . HALLUX RIGIDUS, ACQUIRED 09/20/2007    Mariah Milling 10/06/2015, 3:28 PM  [facility] [name] [place], [place], [address] Phone: [phone]   Fax:  [phone]  Name: Judy Spears MRN: [id number] Date of Birth: 1951/01/24

## 2015-10-06 NOTE — Telephone Encounter (Signed)
Patient needs a refill on Chlorhexidine. Husband also has questions about her other medications if someone could please call him at [phone].

## 2015-10-06 NOTE — Therapy (Signed)
[facility] [address] [place], [place], [address] Phone: [phone]   Fax:  [phone]  Speech Language Pathology Treatment  Patient Details  Name: LAURN DEMBSKI MRN: [id number] Date of Birth: 05-11-1950 Referring Provider: Dr. Alger Simons  Encounter Date: 10/06/2015      End of Session - 10/06/15 1705    Visit Number 6   Number of Visits 24   Date for SLP Re-Evaluation 11/18/15   SLP Start Time 1148   SLP Stop Time  1231   SLP Time Calculation (min) 43 min   Activity Tolerance Patient tolerated treatment well      Past Medical History:  Diagnosis Date  . Arthritis    lt great toe  . Complication of anesthesia    pt has had headaches post op-did advise to hydra well preop  . Hair loss    right-sided  . History of exercise stress test    a. ETT (12/15) with 12:00 exercise, no ST changes, occasional PACs.   . Hyperlipidemia   . Insomnia   . Migraine headache   . 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 Postmenopausal   . Premature atrial contractions    a. Holter (12/15) with 8% PACs, no atrial runs or atrial fibrillation.     Past Surgical History:  Procedure Laterality Date  . BREAST BIOPSY  01/01/2011   Procedure: BREAST BIOPSY WITH NEEDLE LOCALIZATION;  Surgeon: Rolm Bookbinder, MD;  Location: [facility];  Service: General;  Laterality: Left;  left breast wire localization biopsy  . cataracts right eye    . CESAREAN SECTION  1986  . HERNIA REPAIR  2000   RIH  . opirectinal membrane peel    . RHINOPLASTY  1983    There were no vitals filed for this visit.      Subjective Assessment - 10/06/15 1656    Subjective Pt entered room with husband and caregiver, [name].   Currently in Pain? Other (Comment)  no overt indications pain               ADULT SLP TREATMENT - 10/06/15 1656      General Information   Behavior/Cognition  Cooperative;Lethargic;Distractible;Requires cueing;Doesn't follow directions;Decreased sustained attention   Patient Positioning Upright in chair     Treatment Provided   Treatment provided Cognitive-Linquistic     Cognitive-Linquistic Treatment   Treatment focused on Dysarthria;Voice;Aphasia;Apraxia   Skilled Treatment Breath support for phonation continued facilitated today with max h/h cues consistently (verbal, visual, demo, tactile) for inhalation and audible phonation. Pt achieved stronger more audible (but still very quiet) phonation primarily for approx 12 minutes total of session today, at maximum level of alertness. Pt achieved oral approximations of words to repeat (Hey, Hey [name], numbers) approx 30% of opportunities and max cues (same as before-mentioned) with sub-max alertness. Phonation after inhalation achieved with "Hey" and "Heels" 6/15x. Alertness variable -  pt required usual (approx 65% of the session) tactile and verbal cues to sustain attnetion and alerntess, which were mostly successful for short periods of time, between 5 and 60 seconds.     Assessment / Recommendations / Plan   Plan Continue with current plan of care     Progression Toward Goals   Progression toward goals Not progressing toward goals (comment)  success highly dependent upon arousal level            SLP Short Term Goals - 10/06/15 1709  SLP SHORT TERM GOAL #1   Title Pt will demonstrate audible phonation with prolonged vowels and cv syllables 12/15 trials with occasional min A   Time 2   Period Weeks   Status On-going     SLP SHORT TERM GOAL #2   Title Pt will demonstrate breath support prior to repetition of words 8/10x   Time 2   Period Weeks   Status On-going     SLP SHORT TERM GOAL #3   Title Pt will swallow ice chip/H20 via spoon in less than 7 seconds with usual min to mod A 3/5 trials   Time 2   Period Weeks   Status On-going     SLP SHORT TERM GOAL #4   Title Pt will  sustain attention to therapy task for over 7 minutes with occasional min A   Time 2   Period Weeks   Status On-going          SLP Long Term Goals - 10/06/15 1710      SLP LONG TERM GOAL #1   Title Pt will demonstrate audible phonation/intelligible speech with 2 word phrases with occasional min A 5/7 utterances   Time 6   Period Weeks   Status On-going     SLP LONG TERM GOAL #2   Title Pt will demonstrate audible cough/throat clear to command and to clear secretions with occasional min A 4/5 trials.    Time 6   Period Weeks   Status On-going     SLP LONG TERM GOAL #3   Title Pt will achieve timely oral phase and swallow with ice chip/ water trials 5/7 trials   Time 6   Period Weeks   Status On-going     SLP LONG TERM GOAL #4   Title Pt will sustain attention to therapy tasks for 12 minutes with occasiona min A   Time 6   Period Weeks   Status On-going          Plan - 10/06/15 1706    Clinical Impression Statement Pt req'd max A consistently in verbalizing one-word audible speech and rote speech.  Spontaneous speech was unintellgible due mostly to severe dysarthria and aphonia  -  Pt somnolent for the majority of the session and arousal using tactile, auditory, and verbal cueing was not always successful. Re-arousal was most successful with cold metal stick placed in the palm of her left hand. Pt would cont to benefit from skilled ST to address increasing verbal output and swalloiwng skills.   Speech Therapy Frequency 3x / week   Duration --  6 weeks or 19 visits   Treatment/Interventions Group dysphagia treatment;Pharyngeal strengthening exercises;Aspiration precaution training;Environmental controls;Compensatory techniques;Internal/external aids;Cognitive reorganization;Multimodal communcation approach;SLP instruction and feedback;Cueing hierarchy;Functional tasks;Patient/family education;Oral motor exercises   Potential to Achieve Goals Fair   Potential Considerations  Ability to learn/carryover information;Co-morbidities;Severity of impairments   Consulted and Agree with Plan of Care Family member/caregiver      Patient will benefit from skilled therapeutic intervention in order to improve the following deficits and impairments:   Dysphagia, oropharyngeal phase  Dysarthria and anarthria  Cognitive communication deficit    Problem List Patient Active Problem List   Diagnosis Date Noted  . Tracheostomy status ([facility]) 09/26/2015  . Allergic rhinitis 09/26/2015  . 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 Movement disorder 09/22/2015  . Encephalitis 09/22/2015  . Chest pain 02/07/2014  . Premature atrial contractions 01/13/2014  . Abnormality of gait 12/04/2013  . Hyperlipidemia 12/21/2011  . Cardiovascular  risk factor 12/21/2011  . Bunion 01/31/2008  . Metatarsalgia of both feet 09/20/2007  . FLAT FOOT 09/20/2007  . HALLUX RIGIDUS, ACQUIRED 09/20/2007    [facility] ,[place], CCC-SLP  10/06/2015, 5:12 PM  [facility] [address] [place], [place], [address] Phone: [phone]   Fax:  [phone]   Name: FANTA ISIDORO MRN: [id number] Date of Birth: August 05, 1950

## 2015-10-07 ENCOUNTER — Ambulatory Visit: Payer: BC Managed Care – PPO | Admitting: Occupational Therapy

## 2015-10-07 ENCOUNTER — Encounter: Payer: Self-pay | Admitting: Occupational Therapy

## 2015-10-07 DIAGNOSIS — M25622 Stiffness of left elbow, not elsewhere classified: Secondary | ICD-10-CM

## 2015-10-07 DIAGNOSIS — R41844 Frontal lobe and executive function deficit: Secondary | ICD-10-CM

## 2015-10-07 DIAGNOSIS — M6281 Muscle weakness (generalized): Secondary | ICD-10-CM

## 2015-10-07 DIAGNOSIS — R293 Abnormal posture: Secondary | ICD-10-CM | POA: Diagnosis not present

## 2015-10-07 DIAGNOSIS — M25612 Stiffness of left shoulder, not elsewhere classified: Secondary | ICD-10-CM

## 2015-10-07 DIAGNOSIS — R2681 Unsteadiness on feet: Secondary | ICD-10-CM

## 2015-10-07 DIAGNOSIS — M25611 Stiffness of right shoulder, not elsewhere classified: Secondary | ICD-10-CM

## 2015-10-07 DIAGNOSIS — R278 Other lack of coordination: Secondary | ICD-10-CM

## 2015-10-07 NOTE — Therapy (Signed)
[facility] [address] [place], [place], [address] Phone: [phone]   Fax:  [phone]  Occupational Therapy Treatment  Patient Details  Name: Judy Spears MRN: [id number] Date of Birth: 1950/05/05 Referring Provider: Dr. Sharion Dove  Encounter Date: 10/07/2015      OT End of Session - 10/07/15 1644    Visit Number 6   Number of Visits 24   Date for OT Re-Evaluation 11/18/15   Authorization Type BCBS unlimited visits   OT Start Time [id number]   OT Stop Time 1615   OT Time Calculation (min) 43 min   Activity Tolerance Patient tolerated treatment well   Behavior During Therapy [facility] for tasks assessed/performed      Past Medical History:  Diagnosis Date  . Arthritis    lt great toe  . Complication of anesthesia    pt has had headaches post op-did advise to hydra well preop  . Hair loss    right-sided  . History of exercise stress test    a. ETT (12/15) with 12:00 exercise, no ST changes, occasional PACs.   . Hyperlipidemia   . Insomnia   . Migraine headache   . 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 Postmenopausal   . Premature atrial contractions    a. Holter (12/15) with 8% PACs, no atrial runs or atrial fibrillation.     Past Surgical History:  Procedure Laterality Date  . BREAST BIOPSY  01/01/2011   Procedure: BREAST BIOPSY WITH NEEDLE LOCALIZATION;  Surgeon: Rolm Bookbinder, MD;  Location: [facility];  Service: General;  Laterality: Left;  left breast wire localization biopsy  . cataracts right eye    . CESAREAN SECTION  1986  . HERNIA REPAIR  2000   RIH  . opirectinal membrane peel    . RHINOPLASTY  1983    There were no vitals filed for this visit.      Subjective Assessment - 10/07/15 1636    Subjective  I'm good.     Patient is accompained by: Family member   Pertinent History see epic snapshot - ABI/hypoxia from [place]   Patient Stated Goals Pt unable  to state.    Currently in Pain? No/denies   Multiple Pain Sites No                      OT Treatments/Exercises (OP) - 10/07/15 0001      Neurological Re-education Exercises   Other Exercises 1 Worked on sit to stand as needed for lower body dressing.  Patient did best with facilitation of weight shift forward over aligned legs and feet.  Patient wearing BAFO's,  and standing in parallel bars with leg strap to obtain and maintain knee extension.  Patient remains heavilly reliant on upper extremities to pull toward standing, but is showing improved ability to flex trunk forward and begin to lift off of surface.  Patient in this session standing first with knee then hip extension.  Patient never quite achieving full hip and thoracic extension.  With emphasis on LE and trunk alignment, patient better able to stand with control, even beginning to unweight UE's one at a time.                 OT Education - 10/07/15 1643    Education provided Yes   Education Details sit to stand transition/ stand to sit transition   Person(s) Educated Patient;Spouse  Methods Explanation;Demonstration;Tactile cues;Verbal cues   Comprehension Need further instruction;Tactile cues required;Verbal cues required          OT Short Term Goals - 09/26/15 1356      OT SHORT TERM GOAL #1   Title Pt and husband will be mod I with HEP - 10/21/2015   Status On-going     OT SHORT TERM GOAL #2   Title Pt will demonstrate adequate postural control for breath support to voice at least 3 words   Status On-going     OT SHORT TERM GOAL #3   Title Pt will transfer to commode with moderate assistance 75% of the time.   Status On-going     OT SHORT TERM GOAL #4   Title Pt will tolerate positional changes as evidenced by respiration rate and motor quieting in preparation for funcitonal mobility   Status On-going           OT Long Term Goals - 09/26/15 1357      OT LONG TERM GOAL #1   Title  Pt and husband will be mod I with upgraded HEP - 11/18/2015   Status On-going     OT LONG TERM GOAL #2   Title Pt will demonstrate adequate postural control to support 5 words in supported sitting.   Status On-going     OT LONG TERM GOAL #3   Title Pt will demonstrate ability for low reach with RUE for functional task with  mod facilitation   Status On-going     OT LONG TERM GOAL #4   Title Pt will wash face with LUE with min a in supported sitting   Status On-going     OT LONG TERM GOAL #5   Title Pt will release objects with L hand with min faciltation    Status On-going               Plan - 10/07/15 1644    Clinical Impression Statement Patient showing improvement in her ability to flex forward in trunk, with more midline orientation for sit to stand transitions.   Rehab Potential Fair   Clinical Impairments Affecting Rehab Potential severity of deficits, slow rate of recovery   OT Frequency 3x / week   OT Duration 8 weeks   OT Treatment/Interventions Self-care/ADL training;Ultrasound;Moist Heat;Electrical Stimulation;DME and/or AE instruction;Neuromuscular education;Therapeutic exercise;Functional Mobility Training;Manual Therapy;Passive range of motion;Splinting;Therapeutic activities;Balance training;Patient/family education;Visual/perceptual remediation/compensation;Cognitive remediation/compensation   Plan alignment for sit to/from stand, more thoracic/hip extension to free UE's, postural control in standing and sitting   Consulted and Agree with Plan of Care Patient;Family member/caregiver   Family Member Consulted husband [name]      Patient will benefit from skilled therapeutic intervention in order to improve the following deficits and impairments:  Decreased endurance, Decreased skin integrity, Impaired vision/preception, Improper body mechanics, Impaired perceived functional ability, Decreased knowledge of precautions, Cardiopulmonary status limiting activity,  Decreased activity tolerance, Decreased knowledge of use of DME, Decreased strength, Impaired flexibility, Improper spinal/pelvic alignment, Impaired sensation, Difficulty walking, Decreased mobility, Decreased balance, Decreased cognition, Decreased range of motion, Impaired tone, Pain, Impaired UE functional use, Decreased safety awareness, Decreased coordination  Visit Diagnosis: Abnormal posture  Other lack of coordination  Muscle weakness (generalized)  Unsteadiness on feet  Stiffness of right shoulder, not elsewhere classified  Stiffness of left shoulder, not elsewhere classified  Frontal lobe and executive function deficit  Stiffness of left elbow, not elsewhere classified    Problem List Patient Active Problem List   Diagnosis  Date Noted  . Tracheostomy status ([facility]) 09/26/2015  . Allergic rhinitis 09/26/2015  . 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 Movement disorder 09/22/2015  . Encephalitis 09/22/2015  . Chest pain 02/07/2014  . Premature atrial contractions 01/13/2014  . Abnormality of gait 12/04/2013  . Hyperlipidemia 12/21/2011  . Cardiovascular risk factor 12/21/2011  . Bunion 01/31/2008  . Metatarsalgia of both feet 09/20/2007  . FLAT FOOT 09/20/2007  . HALLUX RIGIDUS, ACQUIRED 09/20/2007    Mariah Milling, OTR/L 10/07/2015, 4:48 PM  [facility] [address] [place], [place], [address] Phone: [phone]   Fax:  [phone]  Name: CARISSE CHESSHIR MRN: [id number] Date of Birth: [birth year]

## 2015-10-08 ENCOUNTER — Telehealth: Payer: Self-pay | Admitting: Physical Medicine & Rehabilitation

## 2015-10-08 DIAGNOSIS — A86 Unspecified viral encephalitis: Secondary | ICD-10-CM

## 2015-10-08 MED ORDER — CHLORHEXIDINE GLUCONATE 0.12 % MT SOLN: 15.0000 mL | Freq: Two times a day (BID) | OROMUCOSAL | 6 refills | Status: DC

## 2015-10-08 NOTE — Telephone Encounter (Signed)
Called and left VM. I have refilled chlorhexidine. Advised him to call us back to review any questions he has.

## 2015-10-08 NOTE — Telephone Encounter (Signed)
[name] Bobik has spoken with Dr. [name] and was instructed to call and speak to a nurse about getting all of patient's medications to pharmacy so that he doesn't have to call our office every time they are needed.  His number is [phone].

## 2015-10-09 ENCOUNTER — Ambulatory Visit: Payer: BC Managed Care – PPO | Admitting: Physical Therapy

## 2015-10-09 ENCOUNTER — Ambulatory Visit: Payer: BC Managed Care – PPO

## 2015-10-09 DIAGNOSIS — R278 Other lack of coordination: Secondary | ICD-10-CM

## 2015-10-09 DIAGNOSIS — R1312 Dysphagia, oropharyngeal phase: Secondary | ICD-10-CM

## 2015-10-09 DIAGNOSIS — R2689 Other abnormalities of gait and mobility: Secondary | ICD-10-CM

## 2015-10-09 DIAGNOSIS — R293 Abnormal posture: Secondary | ICD-10-CM | POA: Diagnosis not present

## 2015-10-09 DIAGNOSIS — R41841 Cognitive communication deficit: Secondary | ICD-10-CM

## 2015-10-09 DIAGNOSIS — M6281 Muscle weakness (generalized): Secondary | ICD-10-CM

## 2015-10-09 DIAGNOSIS — R471 Dysarthria and anarthria: Secondary | ICD-10-CM

## 2015-10-09 NOTE — Therapy (Signed)
[facility] [address] [place], [place], [address] Phone: [phone]   Fax:  [phone]  Physical Therapy Treatment  Patient Details  Name: KERRY-ANNE FAMIGLIETTI MRN: [id number] Date of Birth: [birth year] Referring Provider: Alger Simons, MD Sharion Dove, MD)  Encounter Date: 10/09/2015      PT End of Session - 10/09/15 1618    Visit Number 7   Number of Visits 25   Date for PT Re-Evaluation 11/22/15   Authorization Type BCBS   PT Start Time 1448   PT Stop Time 1530   PT Time Calculation (min) 42 min   Activity Tolerance Patient tolerated treatment well   Behavior During Therapy [facility] for tasks assessed/performed      Past Medical History:  Diagnosis Date  . Arthritis    lt great toe  . Complication of anesthesia    pt has had headaches post op-did advise to hydra well preop  . Hair loss    right-sided  . History of exercise stress test    a. ETT (12/15) with 12:00 exercise, no ST changes, occasional PACs.   . Hyperlipidemia   . Insomnia   . Migraine headache   . 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 Postmenopausal   . Premature atrial contractions    a. Holter (12/15) with 8% PACs, no atrial runs or atrial fibrillation.     Past Surgical History:  Procedure Laterality Date  . BREAST BIOPSY  01/01/2011   Procedure: BREAST BIOPSY WITH NEEDLE LOCALIZATION;  Surgeon: Rolm Bookbinder, MD;  Location: [facility];  Service: General;  Laterality: Left;  left breast wire localization biopsy  . cataracts right eye    . CESAREAN SECTION  1986  . HERNIA REPAIR  2000   RIH  . opirectinal membrane peel    . RHINOPLASTY  1983    There were no vitals filed for this visit.      Subjective Assessment - 10/09/15 1611    Subjective Per nurse, "[name] is out of town playing golf."   Patient is accompained by: --  Nurse, [name]   Pertinent History Precautions: trach (capped), PEG, seizure. 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 No/denies      Pt performed blocked practice of sit <> stand from w/c with BUE support at // bars with strap blocking anterior aspect of tibia (proximal), SPT positioned anterior to/facing patient providing cueing for full anterior weight shift. PT providing manual approximation/stabilization at B ankles to maintain foot position in presence of tone (LLE > RLE), which causes increased knee flexion, hip adduction, and hip external rotation. Pt required min-mod A for sit <> stand with BUE support at // bars.                              PT Short Term Goals - 09/23/15 2058      PT SHORT TERM GOAL #1   Title Pt will initiate anterior weight shift sufficient to unweight hips with supervision, 25% cueing in preparation for functional transfer.  (Target date: 10/21/15)     PT SHORT TERM GOAL #2   Title  Pt will perform seated scooting (AP/PA and M/L) with supervision, 25% cueing to increase independence with transfers, repositioning. (10/21/15)     PT SHORT TERM GOAL #3   Title Pt will tolerate static standing with total A of standing frame x10 minutes with vital signs WNL to improve standing tolerance and for tone management.  (10/21/15)     PT SHORT TERM GOAL #4   Title Pt will perform dynamic sitting >/= 5 minutes with BLE's supported, intermittent UE support with CGA to indicate improved postural stability/control necessary for functional transfers. (10/21/15)     PT SHORT TERM GOAL #5   Title Pt will perform sit <> side lying with mod A, 50% cueing to progress toward increased independence with bed mobility.  (10/21/15)     Additional Short Term Goals    Additional Short Term Goals Yes     PT SHORT TERM GOAL #6   Title Pt will consistently perform sit <> stand with min A, 25% cueing to indicate increased independence with functional transfers.  (10/21/15)     PT SHORT TERM GOAL #7   Title Pt will perform level transfer from w/c <> mat table with mod A, 50% cueing to decrease caregiver burden with functional transfers.  (10/21/15)           PT Long Term Goals - 09/23/15 2120      PT LONG TERM GOAL #1   Title Pt will perform supine > sit with mod A, 50% cueing to indicate increased independence with getting out of bed.  (Target: 11/18/15)     PT LONG TERM GOAL #2   Title Pt will perform sit > supine with mod A, 50% cueing to indicate increased independence with getting into bed.  (11/18/15)     PT LONG TERM GOAL #3   Title Pt will perform level transfer from w/c <> mat table with min A, 25% cueing to decrease caregiver burden with functional transfers.  (11/18/15)     PT LONG TERM GOAL #4   Title Pt will perform dynamic standing >/= 2 consecutive minutes with BUE support and mod, 50% cueing to indicate improved postural stanbility/control.   (11/18/15)     PT LONG TERM GOAL #5   Title Patient will ambulate x15' with max A of spouse using LRAD to enable pt/spouse to safely walk into/out of home bathroom.  (11/18/15)               Plan - 10/09/15 1626    Clinical Impression Statement Session focused on increasing pt independence with sit <> stand from w/c at parallel bars with strap anterior to B tibias. Independence with/tolerance to standing limited by reflexive LE movement when hip/knees extended as well as onset of LLE tone (L knee flexion, hip ER/ADD).    Rehab Potential Good   Clinical Impairments Affecting Rehab Potential Positive: strong family/social support; negative: chronicity of condition   PT Frequency 3x / week   PT Duration 8 weeks   PT Treatment/Interventions ADLs/Self Care Home Management;DME  Instruction;Gait training;Stair training;Functional mobility training;Therapeutic activities;Patient/family education;Cognitive remediation;Neuromuscular re-education;Therapeutic exercise;Balance training;Orthotic Fit/Training;Wheelchair mobility training;Manual techniques;Splinting;Visual/perceptual remediation/compensation;Vestibular;Passive range of motion   PT Next Visit Plan If skilled +2A available: quadruped, gait with AFO's, physioball for pelvic mobility/dissociation.  If [name] present, discuss possibility of botox. If no skilled +2A: standing frame, partial standing with BUE support, sit <> stand, seated scooting, supine bridging to scoot, seated reaching for anterior weight shift, or bed mobility.   Consulted and Agree with  Plan of Care Patient;Other (Comment)   Family Member Consulted nurse, [name]      Patient will benefit from skilled therapeutic intervention in order to improve the following deficits and impairments:  Abnormal gait, Decreased balance, Decreased endurance, Decreased cognition, Cardiopulmonary status limiting activity, Decreased activity tolerance, Decreased coordination, Decreased strength, Impaired flexibility, Impaired tone, Decreased mobility, Decreased skin integrity, Increased muscle spasms, Impaired vision/preception, Decreased range of motion, Impaired UE functional use, Postural dysfunction, Pain  Visit Diagnosis: Abnormal posture  Muscle weakness (generalized)  Other lack of coordination  Other abnormalities of gait and mobility     Problem List Patient Active Problem List   Diagnosis Date Noted  . Tracheostomy status ([place]) 09/26/2015  . Allergic rhinitis 09/26/2015  . 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 Movement disorder 09/22/2015  . Encephalitis 09/22/2015  . Chest pain 02/07/2014  . Premature atrial contractions 01/13/2014  . Abnormality of gait 12/04/2013  . Hyperlipidemia 12/21/2011  . Cardiovascular risk factor 12/21/2011  . Bunion 01/31/2008   . Metatarsalgia of both feet 09/20/2007  . FLAT FOOT 09/20/2007  . HALLUX RIGIDUS, ACQUIRED 09/20/2007    Billie Ruddy, PT, DPT [facility] [address] [place], [place], [address] Phone: [phone]   Fax:  [phone] 10/09/15, 4:40 PM  Name: SMAYA ZYLKA MRN: [id number] Date of Birth: [birth year]

## 2015-10-09 NOTE — Telephone Encounter (Signed)
I have left a message with Mr Dransfield asking for clarification of what medications he is asking about. I informed him that we sent the refills on her amantadine and chlorhexidine to pharmacy with 4 and 6 refills respectively, and that Dr Leonie Man has been prescribing the klonopin.

## 2015-10-09 NOTE — Therapy (Signed)
[facility] [address] [place], [place], [address] Phone: [phone]   Fax:  [phone]  Speech Language Pathology Treatment  Patient Details  Name: Judy Spears MRN: [id number] Date of Birth: 01-03-1951 Referring Provider: Dr. Alger Simons  Encounter Date: 10/09/2015      End of Session - 10/09/15 1656    Visit Number 7   Number of Visits 24   Date for SLP Re-Evaluation 11/18/15   SLP Start Time 1532   SLP Stop Time  [id number]   SLP Time Calculation (min) 43 min   Activity Tolerance Patient tolerated treatment well      Past Medical History:  Diagnosis Date  . Arthritis    lt great toe  . Complication of anesthesia    pt has had headaches post op-did advise to hydra well preop  . Hair loss    right-sided  . History of exercise stress test    a. ETT (12/15) with 12:00 exercise, no ST changes, occasional PACs.   . Hyperlipidemia   . Insomnia   . Migraine headache   . 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 Postmenopausal   . Premature atrial contractions    a. Holter (12/15) with 8% PACs, no atrial runs or atrial fibrillation.     Past Surgical History:  Procedure Laterality Date  . BREAST BIOPSY  01/01/2011   Procedure: BREAST BIOPSY WITH NEEDLE LOCALIZATION;  Surgeon: Rolm Bookbinder, MD;  Location: [facility];  Service: General;  Laterality: Left;  left breast wire localization biopsy  . cataracts right eye    . CESAREAN SECTION  1986  . HERNIA REPAIR  2000   RIH  . opirectinal membrane peel    . RHINOPLASTY  1983    There were no vitals filed for this visit.      Subjective Assessment - 10/09/15 1639    Subjective "(Aphonia/unintelligible)"   Patient is accompained by: Judy Spears, caregiver   Currently in Pain? --  no overt indications of pain               ADULT SLP TREATMENT - 10/09/15 1640      General Information   Behavior/Cognition  Cooperative;Lethargic;Distractible;Requires cueing;Doesn't follow directions;Decreased sustained attention   Patient Positioning Upright in chair     Treatment Provided   Treatment provided Cognitive-Linquistic     Cognitive-Linquistic Treatment   Treatment focused on Dysarthria;Voice;Aphasia;Apraxia   Skilled Treatment Max A including hand over hand A (arms up, and out to sides) for adequate breath support for phonation with loud "Hey!" audible 2/22 opportunities. "[name]" (to allow pt trials with a more practical exclamation) with same cueing level 0/7 opportunities. Pt was able to count by twos to 20 with rare min A audibly, and answer 3 of 4 questions audibly, correctly. These most consistent audible responses occurred at the same time as her highest level of alertness -for approx 4 minutes, 2/3 of the way through therapy using a cold stimulus to side of pt's neck, and her palm. Subsequent attempts with this stimulus did not register the same high level of alertness. During times of lower levels of alertness, pt's response frequency was <20% with "wh" questions, and <25% with yes/no responses.     Assessment / Recommendations / Plan   Plan Continue with current plan of care     Progression Toward Goals   Progression toward goals --  pt appears highly motivated for change, when  she is alert            SLP Short Term Goals - 10/09/15 1719      SLP SHORT TERM GOAL #1   Title Pt will demonstrate audible phonation with prolonged vowels and cv syllables 12/15 trials with occasional min A   Time 2   Period Weeks   Status On-going     SLP SHORT TERM GOAL #2   Title Pt will demonstrate breath support prior to repetition of words 8/10x   Time 2   Period Weeks   Status On-going     SLP SHORT TERM GOAL #3   Title Pt will swallow ice chip/H20 via spoon in less than 7 seconds with usual min to mod A 3/5 trials   Time 2   Period Weeks   Status On-going     SLP SHORT TERM GOAL #4    Title Pt will sustain attention to therapy task for over 7 minutes with occasional min A   Time 2   Period Weeks   Status On-going          SLP Long Term Goals - 10/06/15 1710      SLP LONG TERM GOAL #1   Title Pt will demonstrate audible phonation/intelligible speech with 2 word phrases with occasional min A 5/7 utterances   Time 6   Period Weeks   Status On-going     SLP LONG TERM GOAL #2   Title Pt will demonstrate audible cough/throat clear to command and to clear secretions with occasional min A 4/5 trials.    Time 6   Period Weeks   Status On-going     SLP LONG TERM GOAL #3   Title Pt will achieve timely oral phase and swallow with ice chip/ water trials 5/7 trials   Time 6   Period Weeks   Status On-going     SLP LONG TERM GOAL #4   Title Pt will sustain attention to therapy tasks for 12 minutes with occasiona min A   Time 6   Period Weeks   Status On-going          Plan - 10/09/15 1657    Clinical Impression Statement Pt req'd max A consistently in verbalizing one-word audible speech and rote speech.  Spontaneous speech was unintellgible due mostly to dysarthria and aphonia  -  Pt somnolent for the majority of the session and arousal using tactile, auditory, and verbal cueing was not readily successful. Pt's most consistent audible responses with incr'd breath support were for approx 4 minutes 2/3 of the way through the session. In the initial 10 minutes pt was alert but rarely audible given max A consistently. Pt would cont to benefit from skilled ST to address increasing verbal output and swalloiwng skills.   Speech Therapy Frequency 3x / week   Duration --  6 weeks or 18 visits   Treatment/Interventions Group dysphagia treatment;Pharyngeal strengthening exercises;Aspiration precaution training;Environmental controls;Compensatory techniques;Internal/external aids;Cognitive reorganization;Multimodal communcation approach;SLP instruction and feedback;Cueing  hierarchy;Functional tasks;Patient/family education;Oral motor exercises   Potential to Achieve Goals Fair   Potential Considerations Ability to learn/carryover information;Co-morbidities;Severity of impairments   Consulted and Agree with Plan of Care Family member/caregiver      Patient will benefit from skilled therapeutic intervention in order to improve the following deficits and impairments:   Dysphagia, oropharyngeal phase  Dysarthria and anarthria  Cognitive communication deficit    Problem List Patient Active Problem List   Diagnosis Date Noted  . Tracheostomy status (  [place]) 09/26/2015  . Allergic rhinitis 09/26/2015  . 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 Movement disorder 09/22/2015  . Encephalitis 09/22/2015  . Chest pain 02/07/2014  . Premature atrial contractions 01/13/2014  . Abnormality of gait 12/04/2013  . Hyperlipidemia 12/21/2011  . Cardiovascular risk factor 12/21/2011  . Bunion 01/31/2008  . Metatarsalgia of both feet 09/20/2007  . FLAT FOOT 09/20/2007  . HALLUX RIGIDUS, ACQUIRED 09/20/2007    [facility] ,[place], CCC-SLP  10/09/2015, 5:20 PM  [facility] [address] [place], [place], [address] Phone: [phone]   Fax:  [phone]   Name: Judy Spears MRN: [id number] Date of Birth: 03/28/1950

## 2015-10-10 ENCOUNTER — Ambulatory Visit: Payer: BC Managed Care – PPO | Admitting: Speech Pathology

## 2015-10-10 ENCOUNTER — Encounter: Payer: Self-pay | Admitting: Occupational Therapy

## 2015-10-10 ENCOUNTER — Ambulatory Visit: Payer: BC Managed Care – PPO | Admitting: Occupational Therapy

## 2015-10-10 ENCOUNTER — Ambulatory Visit: Payer: BC Managed Care – PPO | Admitting: Rehabilitation

## 2015-10-10 ENCOUNTER — Encounter: Payer: Self-pay | Admitting: Rehabilitation

## 2015-10-10 DIAGNOSIS — R471 Dysarthria and anarthria: Secondary | ICD-10-CM

## 2015-10-10 DIAGNOSIS — R293 Abnormal posture: Secondary | ICD-10-CM | POA: Diagnosis not present

## 2015-10-10 DIAGNOSIS — M6281 Muscle weakness (generalized): Secondary | ICD-10-CM

## 2015-10-10 DIAGNOSIS — M25612 Stiffness of left shoulder, not elsewhere classified: Secondary | ICD-10-CM

## 2015-10-10 DIAGNOSIS — R278 Other lack of coordination: Secondary | ICD-10-CM

## 2015-10-10 DIAGNOSIS — R41844 Frontal lobe and executive function deficit: Secondary | ICD-10-CM

## 2015-10-10 DIAGNOSIS — R2689 Other abnormalities of gait and mobility: Secondary | ICD-10-CM

## 2015-10-10 DIAGNOSIS — M25622 Stiffness of left elbow, not elsewhere classified: Secondary | ICD-10-CM

## 2015-10-10 DIAGNOSIS — R1312 Dysphagia, oropharyngeal phase: Secondary | ICD-10-CM

## 2015-10-10 DIAGNOSIS — M25611 Stiffness of right shoulder, not elsewhere classified: Secondary | ICD-10-CM

## 2015-10-10 DIAGNOSIS — R2681 Unsteadiness on feet: Secondary | ICD-10-CM

## 2015-10-10 MED ORDER — DOCUSATE SODIUM 50 MG/5ML PO LIQD: 100.0000 mg | Freq: Every day | ORAL | 5 refills | Status: DC

## 2015-10-10 MED ORDER — TRAZODONE HCL 50 MG PO TABS: 50.0000 mg | ORAL_TABLET | Freq: Every day | ORAL | 4 refills | Status: DC

## 2015-10-10 MED ORDER — FLUTICASONE PROPIONATE 50 MCG/ACT NA SUSP: 2.0000 | Freq: Every day | NASAL | 4 refills | Status: DC

## 2015-10-10 MED ORDER — VALACYCLOVIR HCL 500 MG PO TABS: 500.0000 mg | ORAL_TABLET | Freq: Every day | ORAL | 5 refills | Status: DC

## 2015-10-10 MED ORDER — GABAPENTIN 100 MG PO CAPS: 100.0000 mg | ORAL_CAPSULE | Freq: Every day | ORAL | 2 refills | Status: DC

## 2015-10-10 MED ORDER — AMANTADINE HCL 50 MG/5ML PO SYRP: 150.0000 mg | ORAL_SOLUTION | Freq: Two times a day (BID) | ORAL | 5 refills | Status: DC

## 2015-10-10 NOTE — Telephone Encounter (Signed)
Mr Yoss has called back about getting all her meds sent to [facility].  He is becoming frustrated about [place] not understanding and being able to send all her meds that were established for her at the [facility]. We do not have the full prescription with disp to refill. Dr [name] notified.

## 2015-10-10 NOTE — Therapy (Signed)
[facility] [address] [place], [place], [address] Phone: [phone]   Fax:  [phone]  Speech Language Pathology Treatment  Patient Details  Name: Judy Spears MRN: [id number] Date of Birth: 17-Jul-1950 Referring Provider: Dr. Alger Simons  Encounter Date: 10/10/2015      End of Session - 10/10/15 1355    Visit Number 8   Number of Visits 24   SLP Start Time [id number]   SLP Stop Time  [id number]   SLP Time Calculation (min) 43 min      Past Medical History:  Diagnosis Date  . Arthritis    lt great toe  . Complication of anesthesia    pt has had headaches post op-did advise to hydra well preop  . Hair loss    right-sided  . History of exercise stress test    a. ETT (12/15) with 12:00 exercise, no ST changes, occasional PACs.   . Hyperlipidemia   . Insomnia   . Migraine headache   . 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 Postmenopausal   . Premature atrial contractions    a. Holter (12/15) with 8% PACs, no atrial runs or atrial fibrillation.     Past Surgical History:  Procedure Laterality Date  . BREAST BIOPSY  01/01/2011   Procedure: BREAST BIOPSY WITH NEEDLE LOCALIZATION;  Surgeon: Rolm Bookbinder, MD;  Location: [facility];  Service: General;  Laterality: Left;  left breast wire localization biopsy  . cataracts right eye    . CESAREAN SECTION  1986  . HERNIA REPAIR  2000   RIH  . opirectinal membrane peel    . RHINOPLASTY  1983    There were no vitals filed for this visit.      Subjective Assessment - 10/10/15 1345    Subjective Pt placed in A frame stander for ST to maximize alertness   Special Tests spouse, [name]               ADULT SLP TREATMENT - 10/10/15 1242      General Information   Behavior/Cognition Cooperative;Lethargic;Distractible;Requires cueing;Doesn't follow directions;Decreased sustained attention     Treatment Provided   Treatment provided  Cognitive-Linquistic;Dysphagia     Dysphagia Treatment   Temperature Spikes Noted No   Treatment Methods Skilled observation;Therapeutic exercise;Patient/caregiver education   Patient observed directly with PO's No   Type of cueing Verbal;Tactile;Visual   Amount of cueing Maximal   Other treatment/comments Faciitated swallow response with  max A ofcold lemon swab, wet spoon, laryngeal massage. Pt elicited 2 swallows during session. Weak,breathy coughing noted on saliva throughout session. Cough was used to stimulate audible phonation with 60% success and max A. At this time, pt is not ready for objective swallow study.     Pain Assessment   Pain Assessment No/denies pain     Cognitive-Linquistic Treatment   Treatment focused on Dysarthria;Voice;Aphasia;Apraxia   Skilled Treatment Facilitated audible phonation with cues for inhalation and use of cough to sustain 1-2 seconds of phonation. "Hey" , count by 5's and cloze stimulation of pledge of allegiance audible 50% of trials. with "Hey" being most succesful.      Assessment / Recommendations / Plan   Plan Continue with current plan of care     Progression Toward Goals   Progression toward goals Progressing toward goals            SLP Short Term Goals - 10/10/15 1354  SLP SHORT TERM GOAL #1   Title Pt will demonstrate audible phonation with prolonged vowels and cv syllables 12/15 trials with occasional min A   Time 2   Period Weeks   Status On-going     SLP SHORT TERM GOAL #2   Title Pt will demonstrate breath support prior to repetition of words 8/10x   Time 2   Period Weeks   Status On-going     SLP SHORT TERM GOAL #3   Title Pt will swallow ice chip/H20 via spoon in less than 7 seconds with usual min to mod A 3/5 trials   Time 2   Period Weeks   Status On-going     SLP SHORT TERM GOAL #4   Title Pt will sustain attention to therapy task for over 7 minutes with occasional min A   Time 2   Period Weeks   Status  On-going          SLP Long Term Goals - 10/10/15 1354      SLP LONG TERM GOAL #1   Title Pt will demonstrate audible phonation/intelligible speech with 2 word phrases with occasional min A 5/7 utterances   Time 6   Period Weeks   Status On-going     SLP LONG TERM GOAL #2   Title Pt will demonstrate audible cough/throat clear to command and to clear secretions with occasional min A 4/5 trials.    Time 6   Period Weeks   Status On-going     SLP LONG TERM GOAL #3   Title Pt will achieve timely oral phase and swallow with ice chip/ water trials 5/7 trials   Time 6   Period Weeks   Status On-going     SLP LONG TERM GOAL #4   Title Pt will sustain attention to therapy tasks for 12 minutes with occasiona min A   Time 6   Period Weeks   Status On-going          Plan - 10/10/15 1353    Clinical Impression Statement Pt placed in A fram stander which resulted in eyes open and improved alterness for the duration of this session.  Pt continues to require max A for swallow and phonation/speech. Continue skilled ST to maximize verbal expression and swallowing for eventual PO intake   Speech Therapy Frequency 3x / week   Treatment/Interventions Group dysphagia treatment;Pharyngeal strengthening exercises;Aspiration precaution training;Environmental controls;Compensatory techniques;Internal/external aids;Cognitive reorganization;Multimodal communcation approach;SLP instruction and feedback;Cueing hierarchy;Functional tasks;Patient/family education;Oral motor exercises   Potential to Achieve Goals Fair   Potential Considerations Ability to learn/carryover information;Co-morbidities;Severity of impairments   Consulted and Agree with Plan of Care Family member/caregiver   Family Member Consulted [name] - caregiver, [name]- spouse      Patient will benefit from skilled therapeutic intervention in order to improve the following deficits and impairments:   Dysphagia, oropharyngeal  phase  Dysarthria and anarthria    Problem List Patient Active Problem List   Diagnosis Date Noted  . Tracheostomy status ([place]) 09/26/2015  . Allergic rhinitis 09/26/2015  . 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 Movement disorder 09/22/2015  . Encephalitis 09/22/2015  . Chest pain 02/07/2014  . Premature atrial contractions 01/13/2014  . Abnormality of gait 12/04/2013  . Hyperlipidemia 12/21/2011  . Cardiovascular risk factor 12/21/2011  . Bunion 01/31/2008  . Metatarsalgia of both feet 09/20/2007  . FLAT FOOT 09/20/2007  . HALLUX RIGIDUS, ACQUIRED 09/20/2007    Alonnah Lampkins, Annye Rusk MS, CCC-SLP 10/10/2015, 1:55 PM  [facility] [address] [place], [place], [address] Phone: [phone]   Fax:  [phone]   Name: Judy Spears MRN: [id number] Date of Birth: [birth year]

## 2015-10-10 NOTE — Therapy (Signed)
[facility] [address] [place], [place], [address] Phone: [phone]   Fax:  [phone]  Occupational Therapy Treatment  Patient Details  Name: Judy Spears MRN: [id number] Date of Birth: 1950/06/10 Referring Provider: Dr. Sharion Dove  Encounter Date: 10/10/2015      OT End of Session - 10/10/15 1715    Activity Tolerance Patient tolerated treatment well   Behavior During Therapy [facility] for tasks assessed/performed      Past Medical History:  Diagnosis Date  . Arthritis    lt great toe  . Complication of anesthesia    pt has had headaches post op-did advise to hydra well preop  . Hair loss    right-sided  . History of exercise stress test    a. ETT (12/15) with 12:00 exercise, no ST changes, occasional PACs.   . Hyperlipidemia   . Insomnia   . Migraine headache   . 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 Postmenopausal   . Premature atrial contractions    a. Holter (12/15) with 8% PACs, no atrial runs or atrial fibrillation.     Past Surgical History:  Procedure Laterality Date  . BREAST BIOPSY  01/01/2011   Procedure: BREAST BIOPSY WITH NEEDLE LOCALIZATION;  Surgeon: Rolm Bookbinder, MD;  Location: [facility];  Service: General;  Laterality: Left;  left breast wire localization biopsy  . cataracts right eye    . CESAREAN SECTION  1986  . HERNIA REPAIR  2000   RIH  . opirectinal membrane peel    . RHINOPLASTY  1983    There were no vitals filed for this visit.      Subjective Assessment - 10/10/15 1711    Subjective  Hi Judy Spears   Patient is accompained by: Family member   Pertinent History see epic snapshot - ABI/hypoxia from [place]   Patient Stated Goals Pt unable to state.    Currently in Pain? No/denies   Multiple Pain Sites No                      OT Treatments/Exercises (OP) - 10/10/15 0001      Neurological Re-education Exercises   Other  Exercises 1 Continuing to work on transitional movements requiring forward trunk weight shift, and initiation of sit to stand over legs.  Patient did very well from elevated seated surface, with decreased emphasis on pulling up with UE's.  Patient able to transition without UE use with only min assist.  Patient still has difficulty achieving full hip extension, and thoracic extension.  Patient with improved midline alignment, and reduced tension in left neck                  OT Short Term Goals - 09/26/15 1356      OT SHORT TERM GOAL #1   Title Pt and husband will be mod I with HEP - 10/21/2015   Status On-going     OT SHORT TERM GOAL #2   Title Pt will demonstrate adequate postural control for breath support to voice at least 3 words   Status On-going     OT SHORT TERM GOAL #3   Title Pt will transfer to commode with moderate assistance 75% of the time.   Status On-going     OT SHORT TERM GOAL #4   Title Pt will tolerate positional changes as evidenced by respiration rate and motor quieting in preparation for  funcitonal mobility   Status On-going           OT Long Term Goals - 09/26/15 1357      OT LONG TERM GOAL #1   Title Pt and husband will be mod I with upgraded HEP - 11/18/2015   Status On-going     OT LONG TERM GOAL #2   Title Pt will demonstrate adequate postural control to support 5 words in supported sitting.   Status On-going     OT LONG TERM GOAL #3   Title Pt will demonstrate ability for low reach with RUE for functional task with  mod facilitation   Status On-going     OT LONG TERM GOAL #4   Title Pt will wash face with LUE with min a in supported sitting   Status On-going     OT LONG TERM GOAL #5   Title Pt will release objects with L hand with min faciltation    Status On-going               Plan - 10/10/15 1715    Clinical Impression Statement Patient showing improved postural control and midline orientation.     Rehab Potential  Fair   Clinical Impairments Affecting Rehab Potential severity of deficits, slow rate of recovery   OT Frequency 3x / week   OT Duration 8 weeks   OT Treatment/Interventions Self-care/ADL training;Ultrasound;Moist Heat;Electrical Stimulation;DME and/or AE instruction;Neuromuscular education;Therapeutic exercise;Functional Mobility Training;Manual Therapy;Passive range of motion;Splinting;Therapeutic activities;Balance training;Patient/family education;Visual/perceptual remediation/compensation;Cognitive remediation/compensation   Plan sit to from stand, more thoracic hip extension   OT Home Exercise Plan Discussed with patient, with husband present, the improtance of flexing forward and using legs for transfers and standing   Consulted and Agree with Plan of Care Patient;Family member/caregiver   Family Member Consulted husband [name]      Patient will benefit from skilled therapeutic intervention in order to improve the following deficits and impairments:  Decreased endurance, Decreased skin integrity, Impaired vision/preception, Improper body mechanics, Impaired perceived functional ability, Decreased knowledge of precautions, Cardiopulmonary status limiting activity, Decreased activity tolerance, Decreased knowledge of use of DME, Decreased strength, Impaired flexibility, Improper spinal/pelvic alignment, Impaired sensation, Difficulty walking, Decreased mobility, Decreased balance, Decreased cognition, Decreased range of motion, Impaired tone, Pain, Impaired UE functional use, Decreased safety awareness, Decreased coordination  Visit Diagnosis: Abnormal posture  Muscle weakness (generalized)  Other lack of coordination  Unsteadiness on feet  Stiffness of right shoulder, not elsewhere classified  Stiffness of left shoulder, not elsewhere classified  Frontal lobe and executive function deficit  Stiffness of left elbow, not elsewhere classified    Problem List Patient Active Problem  List   Diagnosis Date Noted  . Tracheostomy status ([place]) 09/26/2015  . Allergic rhinitis 09/26/2015  . 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 Movement disorder 09/22/2015  . Encephalitis 09/22/2015  . Chest pain 02/07/2014  . Premature atrial contractions 01/13/2014  . Abnormality of gait 12/04/2013  . Hyperlipidemia 12/21/2011  . Cardiovascular risk factor 12/21/2011  . Bunion 01/31/2008  . Metatarsalgia of both feet 09/20/2007  . FLAT FOOT 09/20/2007  . HALLUX RIGIDUS, ACQUIRED 09/20/2007    Judy Spears 10/10/2015, 5:17 PM  [facility] [address] [place], [place], [address] Phone: [phone]   Fax:  [phone]  Name: Judy Spears MRN: [id number] Date of Birth: 07-Jan-1951

## 2015-10-10 NOTE — Therapy (Signed)
[facility] [address] [place], [place], [address] Phone: [phone]   Fax:  [phone]  Physical Therapy Treatment  Patient Details  Name: Judy Spears MRN: [id number] Date of Birth: Nov 14, 1950 Referring Provider: Alger Simons, MD Sharion Dove, MD)  Encounter Date: 10/10/2015      PT End of Session - 10/10/15 1633    Visit Number 8   Number of Visits 25   Date for PT Re-Evaluation 11/22/15   Authorization Type BC[employer]   PT Start Time 1400   PT Stop Time 1445   PT Time Calculation (min) 45 min   Activity Tolerance Patient tolerated treatment well   Behavior During Therapy [facility] for tasks assessed/performed      Past Medical History:  Diagnosis Date  . Arthritis    lt great toe  . Complication of anesthesia    pt has had headaches post op-did advise to hydra well preop  . Hair loss    right-sided  . History of exercise stress test    a. ETT (12/15) with 12:00 exercise, no ST changes, occasional PACs.   . Hyperlipidemia   . Insomnia   . Migraine headache   . 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 Postmenopausal   . Premature atrial contractions    a. Holter (12/15) with 8% PACs, no atrial runs or atrial fibrillation.     Past Surgical History:  Procedure Laterality Date  . BREAST BIOPSY  01/01/2011   Procedure: BREAST BIOPSY WITH NEEDLE LOCALIZATION;  Surgeon: Rolm Bookbinder, MD;  Location: [facility];  Service: General;  Laterality: Left;  left breast wire localization biopsy  . cataracts right eye    . CESAREAN SECTION  1986  . HERNIA REPAIR  2000   RIH  . opirectinal membrane peel    . RHINOPLASTY  1983    There were no vitals filed for this visit.      Subjective Assessment - 10/10/15 1628    Subjective Per nurse and husband, no changes since last visit.    Patient is accompained by: Family member   Pertinent History Precautions: trach (capped), PEG, seizure. 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 Other (Comment)  no overt signs of pain during session              NMR:  Worked in tall kneeling position today (with UE support intermittently on peanut ball) with focus on improving trunk and postural control with increased activation of hip extension.  Note pt doing very well initiating hip extension for brief periods of time, however sustained activation continues to be difficult.  Also note that forward flexed posture continues to initiate from head and chest/shoulders, therefore worked on upright posture during task by elevating UEs (shoulder flexion and abduction).  +3 assist during task with OT Antony Salmon) assisting with UEs, PT on R assisting with pelvic protraction and trunk control and PT on L to assist with visual target for improved weight shift to the L.  Cues throughout for improved L weight shift, R trunk lean, improved cervical R lateral flexion, hip protraction.  While in tall kneeling had pt work on hip bumps to the  L for improved weight shift, advancing LEs forwards/backwards on mat for improved lateral weight shifts to carry over to gait.  Note marked improvement in BLE restlessness during and following task.  Intermittent rest breaks with elbows on peanut ball.                     PT Education - 10/10/15 1632    Education provided Yes   Education Details sending video of Judy Spears ambulating to MD to begin to address possible botox injections in LEs.    Person(s) Educated Patient;Spouse   Methods Explanation;Demonstration   Comprehension Verbalized understanding          PT Short Term Goals - 09/23/15 2058      PT SHORT TERM GOAL  #1   Title Pt will initiate anterior weight shift sufficient to unweight hips with supervision, 25% cueing in preparation for functional transfer.  (Target date: 10/21/15)     PT SHORT TERM GOAL #2   Title Pt will perform seated scooting (AP/PA and M/L) with supervision, 25% cueing to increase independence with transfers, repositioning. (10/21/15)     PT SHORT TERM GOAL #3   Title Pt will tolerate static standing with total A of standing frame x10 minutes with vital signs WNL to improve standing tolerance and for tone management.  (10/21/15)     PT SHORT TERM GOAL #4   Title Pt will perform dynamic sitting >/= 5 minutes with BLE's supported, intermittent UE support with CGA to indicate improved postural stability/control necessary for functional transfers. (10/21/15)     PT SHORT TERM GOAL #5   Title Pt will perform sit <> side lying with mod A, 50% cueing to progress toward increased independence with bed mobility.  (10/21/15)     Additional Short Term Goals   Additional Short Term Goals Yes     PT SHORT TERM GOAL #6   Title Pt will consistently perform sit <> stand with min A, 25% cueing to indicate increased independence with functional transfers.  (10/21/15)     PT SHORT TERM GOAL #7   Title Pt will perform level transfer from w/c <> mat table with mod A, 50% cueing to decrease caregiver burden with functional transfers.  (10/21/15)           PT Long Term Goals - 09/23/15 2120      PT LONG TERM GOAL #1   Title Pt will perform supine > sit with mod A, 50% cueing to indicate increased independence with getting out of bed.  (Target: 11/18/15)     PT LONG TERM GOAL #2   Title Pt will perform sit > supine with mod A, 50% cueing to indicate increased independence with getting into bed.  (11/18/15)     PT LONG TERM GOAL #3   Title Pt will perform level transfer from w/c <> mat table with min A, 25% cueing to decrease caregiver burden with functional transfers.  (11/18/15)     PT LONG  TERM GOAL #4   Title Pt will perform dynamic standing >/= 2 consecutive minutes with BUE support and mod, 50% cueing to indicate improved postural stanbility/control.   (11/18/15)     PT LONG TERM GOAL #5   Title Patient will ambulate x15' with max A of spouse using LRAD to enable pt/spouse to safely walk into/out of home bathroom.  (11/18/15)               Plan - 10/10/15 1636  Clinical Impression Statement Skilled session focused on improved trunk activation, postural control and hip extension in tall kneeling.  Continues to demonstrate improved ability to active hip musculature, however forward flexed posture seems to initiate from shoulders and head, therefore worked to maintain UEs in elevated position to maintain upright posture.  Tolerated very well.     Rehab Potential Good   Clinical Impairments Affecting Rehab Potential Positive: strong family/social support; negative: chronicity of condition   PT Frequency 3x / week   PT Duration 8 weeks   PT Treatment/Interventions ADLs/Self Care Home Management;DME Instruction;Gait training;Stair training;Functional mobility training;Therapeutic activities;Patient/family education;Cognitive remediation;Neuromuscular re-education;Therapeutic exercise;Balance training;Orthotic Fit/Training;Wheelchair mobility training;Manual techniques;Splinting;Visual/perceptual remediation/compensation;Vestibular;Passive range of motion   PT Next Visit Plan If skilled +2A available: quadruped, gait with AFO's, physioball for pelvic mobility/dissociation.  If [name] present, discuss possibility of botox (see if video sent to [facility]). If no skilled +2A: standing frame, partial standing with BUE support, sit <> stand, seated scooting, supine bridging to scoot, seated reaching for anterior weight shift, or bed mobility.   Consulted and Agree with Plan of Care Patient;Other (Comment)   Family Member Consulted nurse, [name]      Patient will benefit from skilled  therapeutic intervention in order to improve the following deficits and impairments:  Abnormal gait, Decreased balance, Decreased endurance, Decreased cognition, Cardiopulmonary status limiting activity, Decreased activity tolerance, Decreased coordination, Decreased strength, Impaired flexibility, Impaired tone, Decreased mobility, Decreased skin integrity, Increased muscle spasms, Impaired vision/preception, Decreased range of motion, Impaired UE functional use, Postural dysfunction, Pain  Visit Diagnosis: Abnormal posture  Muscle weakness (generalized)  Other lack of coordination  Other abnormalities of gait and mobility     Problem List Patient Active Problem List   Diagnosis Date Noted  . Tracheostomy status ([place]) 09/26/2015  . Allergic rhinitis 09/26/2015  . 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 Movement disorder 09/22/2015  . Encephalitis 09/22/2015  . Chest pain 02/07/2014  . Premature atrial contractions 01/13/2014  . Abnormality of gait 12/04/2013  . Hyperlipidemia 12/21/2011  . Cardiovascular risk factor 12/21/2011  . Bunion 01/31/2008  . Metatarsalgia of both feet 09/20/2007  . FLAT FOOT 09/20/2007  . HALLUX RIGIDUS, ACQUIRED 09/20/2007    Cameron Sprang, PT, MPT [facility] [address] [place], [place], [address] Phone: [phone]   Fax:  [phone] 10/10/15, 4:42 PM'  Name: Judy Spears MRN: [id number] Date of Birth: 07-Aug-1950

## 2015-10-10 NOTE — Telephone Encounter (Signed)
I spoke with Mr Devoll and he confirmed Dr [name] had spoken with him and informed him the meds have been sent to pharmacy.

## 2015-10-10 NOTE — Addendum Note (Signed)
Addended by: Alger Simons T on: 10/10/2015 01:17 PM   Modules accepted: Orders

## 2015-10-14 ENCOUNTER — Telehealth (HOSPITAL_COMMUNITY): Payer: Self-pay | Admitting: Physical Medicine and Rehabilitation

## 2015-10-14 ENCOUNTER — Ambulatory Visit: Payer: BC Managed Care – PPO | Admitting: Occupational Therapy

## 2015-10-14 ENCOUNTER — Ambulatory Visit: Payer: BC Managed Care – PPO | Admitting: Speech Pathology

## 2015-10-14 ENCOUNTER — Encounter: Payer: Self-pay | Admitting: Occupational Therapy

## 2015-10-14 DIAGNOSIS — R278 Other lack of coordination: Secondary | ICD-10-CM

## 2015-10-14 DIAGNOSIS — R41844 Frontal lobe and executive function deficit: Secondary | ICD-10-CM

## 2015-10-14 DIAGNOSIS — M25612 Stiffness of left shoulder, not elsewhere classified: Secondary | ICD-10-CM

## 2015-10-14 DIAGNOSIS — R293 Abnormal posture: Secondary | ICD-10-CM | POA: Diagnosis not present

## 2015-10-14 DIAGNOSIS — R471 Dysarthria and anarthria: Secondary | ICD-10-CM

## 2015-10-14 DIAGNOSIS — M6281 Muscle weakness (generalized): Secondary | ICD-10-CM

## 2015-10-14 DIAGNOSIS — R1312 Dysphagia, oropharyngeal phase: Secondary | ICD-10-CM

## 2015-10-14 DIAGNOSIS — M25622 Stiffness of left elbow, not elsewhere classified: Secondary | ICD-10-CM

## 2015-10-14 DIAGNOSIS — R2681 Unsteadiness on feet: Secondary | ICD-10-CM

## 2015-10-14 DIAGNOSIS — M25611 Stiffness of right shoulder, not elsewhere classified: Secondary | ICD-10-CM

## 2015-10-14 NOTE — Therapy (Signed)
[facility] [address] [place], [place], [address] Phone: [phone]   Fax:  [phone]  Speech Language Pathology Treatment  Patient Details  Name: Judy Spears MRN: [id number] Date of Birth: 27-Mar-1950 Referring Provider: Dr. Alger Simons  Encounter Date: 10/14/2015      End of Session - 10/14/15 1215    Visit Number 9   Number of Visits 24   Date for SLP Re-Evaluation 11/18/15   SLP Start Time 0847   SLP Stop Time  0930   SLP Time Calculation (min) 43 min   Activity Tolerance Patient tolerated treatment well      Past Medical History:  Diagnosis Date  . Arthritis    lt great toe  . Complication of anesthesia    pt has had headaches post op-did advise to hydra well preop  . Hair loss    right-sided  . History of exercise stress test    a. ETT (12/15) with 12:00 exercise, no ST changes, occasional PACs.   . Hyperlipidemia   . Insomnia   . Migraine headache   . 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 Postmenopausal   . Premature atrial contractions    a. Holter (12/15) with 8% PACs, no atrial runs or atrial fibrillation.     Past Surgical History:  Procedure Laterality Date  . BREAST BIOPSY  01/01/2011   Procedure: BREAST BIOPSY WITH NEEDLE LOCALIZATION;  Surgeon: Rolm Bookbinder, MD;  Location: [facility];  Service: General;  Laterality: Left;  left breast wire localization biopsy  . cataracts right eye    . CESAREAN SECTION  1986  . HERNIA REPAIR  2000   RIH  . opirectinal membrane peel    . RHINOPLASTY  1983    There were no vitals filed for this visit.      Subjective Assessment - 10/14/15 1023    Subjective "OK" in respond to how are you - breathy speech   Special Tests spouse, [name]; caregiver   Currently in Pain? No/denies               ADULT SLP TREATMENT - 10/14/15 1024      General Information   Behavior/Cognition  Cooperative;Lethargic;Distractible;Requires cueing;Doesn't follow directions;Decreased sustained attention     Treatment Provided   Treatment provided Cognitive-Linquistic     Dysphagia Treatment   Temperature Spikes Noted No   Oral Cavity - Dentition Adequate natural dentition   Treatment Methods Skilled observation;Therapeutic exercise;Patient/caregiver education   Patient observed directly with PO's Yes   Type of PO's observed Ice chips   Feeding Needs assist;Total assist   Type of cueing Verbal;Tactile;Visual   Amount of cueing Maximal   Other treatment/comments ice chips provided 1-2 at a time with min A for mastication and to keep head up and lips closed. Labial spillage reduced with these cues and tactile cue under her chin.  Mod to max verbal and tactile cues to swallow with delay. 14 swallows elicited today. Wet voice and weak cough present indicating reduced airway protection. Mod to max A to cough up pharyngeal reside then swallow.      Pain Assessment   Pain Assessment No/denies pain     Cognitive-Linquistic Treatment   Treatment focused on Dysarthria;Voice;Aphasia;Apraxia   Skilled Treatment Audible phonation facilitated with repeition of "Hey" Hey you" Hey [name] with mod to max verbal, visual and tactile cues. Spontaneous speech intelligible less than 50% of the time. Complete sentences/phrases from  preamble, nursery rhymes audibly with frequent max verbal cues and modeling. Audible phonation achieved 9/15 trials      Assessment / Recommendations / Plan   Plan Continue with current plan of care     Progression Toward Goals   Progression toward goals Progressing toward goals          SLP Education - 10/14/15 1208    Education provided Yes   Education Details breath support for speech   Person(s) Educated Patient;Spouse;Caregiver(s)   Methods Explanation;Demonstration   Comprehension Returned demonstration;Verbalized understanding;Verbal cues required           SLP Short Term Goals - 10/14/15 1213      SLP SHORT TERM GOAL #1   Title Pt will demonstrate audible phonation with prolonged vowels and cv syllables 12/15 trials with occasional min A   Time 2   Period Weeks   Status On-going     SLP SHORT TERM GOAL #2   Title Pt will demonstrate breath support prior to repetition of words 8/10x   Time 2   Period Weeks   Status On-going     SLP SHORT TERM GOAL #3   Title Pt will swallow ice chip/H20 via spoon in less than 7 seconds with usual min to mod A 3/5 trials   Time 2   Period Weeks   Status On-going     SLP SHORT TERM GOAL #4   Title Pt will sustain attention to therapy task for over 7 minutes with occasional min A   Time 2   Period Weeks   Status On-going          SLP Long Term Goals - 10/14/15 1215      SLP LONG TERM GOAL #1   Title Pt will demonstrate audible phonation/intelligible speech with 2 word phrases with occasional min A 5/7 utterances   Time 5   Period Weeks   Status On-going     SLP LONG TERM GOAL #2   Title Pt will demonstrate audible cough/throat clear to command and to clear secretions with occasional min A 4/5 trials.    Time 5   Period Weeks   Status On-going     SLP LONG TERM GOAL #3   Title Pt will achieve timely oral phase and swallow with ice chip/ water trials 5/7 trials   Time 5   Period Weeks   Status On-going     SLP LONG TERM GOAL #4   Title Pt will sustain attention to therapy tasks for 12 minutes with occasiona min A   Time 5   Period Weeks   Status On-going          Plan - 10/14/15 1209    Clinical Impression Statement Pt seen in wheel chair today. Alterness improved this session as pt sustained attention/arousal with occasional min A. Pt attempted to participate in simple conversation for 1 minute with max A for intelligibility/phonation  Structured breath support and vowel, one word utterances with max A - tactile, visual, verbal cues for audible phonation. Continue skilled ST to  maximize intellgibility, attention, and swallow .    Speech Therapy Frequency 3x / week   Treatment/Interventions Group dysphagia treatment;Pharyngeal strengthening exercises;Aspiration precaution training;Environmental controls;Compensatory techniques;Internal/external aids;Cognitive reorganization;Multimodal communcation approach;SLP instruction and feedback;Cueing hierarchy;Functional tasks;Patient/family education;Oral motor exercises   Potential to Achieve Goals Fair   Potential Considerations Ability to learn/carryover information;Co-morbidities;Severity of impairments   Consulted and Agree with Plan of Care Family member/caregiver   Family Member Consulted [place] -  caregiver, [name]- spouse      Patient will benefit from skilled therapeutic intervention in order to improve the following deficits and impairments:   Dysphagia, oropharyngeal phase  Dysarthria and anarthria    Problem List Patient Active Problem List   Diagnosis Date Noted  . Tracheostomy status ([place]) 09/26/2015  . Allergic rhinitis 09/26/2015  . 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 Movement disorder 09/22/2015  . Encephalitis 09/22/2015  . Chest pain 02/07/2014  . Premature atrial contractions 01/13/2014  . Abnormality of gait 12/04/2013  . Hyperlipidemia 12/21/2011  . Cardiovascular risk factor 12/21/2011  . Bunion 01/31/2008  . Metatarsalgia of both feet 09/20/2007  . FLAT FOOT 09/20/2007  . HALLUX RIGIDUS, ACQUIRED 09/20/2007    Jonta Gastineau, Annye Rusk MS, CCC-SLP 10/14/2015, 12:16 PM  [facility] [address] [place], [place], [address] Phone: [phone]   Fax:  [phone]   Name: Judy Spears MRN: [id number] Date of Birth: 05/03/1950

## 2015-10-14 NOTE — Therapy (Signed)
[facility] [address] [place], [place], [address] Phone: [phone]   Fax:  [phone]  Occupational Therapy Treatment  Patient Details  Name: Judy Spears MRN: [id number] Date of Birth: January 13, 1951 Referring Provider: Dr. Sharion Dove  Encounter Date: 10/14/2015      OT End of Session - 10/14/15 1118    Visit Number 8   Number of Visits 24   Date for OT Re-Evaluation 11/18/15   Authorization Type BC[employer] unlimited visits   OT Start Time 0930   OT Stop Time 1015   OT Time Calculation (min) 45 min   Activity Tolerance Patient tolerated treatment well   Behavior During Therapy [facility] for tasks assessed/performed      Past Medical History:  Diagnosis Date  . Arthritis    lt great toe  . Complication of anesthesia    pt has had headaches post op-did advise to hydra well preop  . Hair loss    right-sided  . History of exercise stress test    a. ETT (12/15) with 12:00 exercise, no ST changes, occasional PACs.   . Hyperlipidemia   . Insomnia   . Migraine headache   . 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 Postmenopausal   . Premature atrial contractions    a. Holter (12/15) with 8% PACs, no atrial runs or atrial fibrillation.     Past Surgical History:  Procedure Laterality Date  . BREAST BIOPSY  01/01/2011   Procedure: BREAST BIOPSY WITH NEEDLE LOCALIZATION;  Surgeon: Rolm Bookbinder, MD;  Location: [facility];  Service: General;  Laterality: Left;  left breast wire localization biopsy  . cataracts right eye    . CESAREAN SECTION  1986  . HERNIA REPAIR  2000   RIH  . opirectinal membrane peel    . RHINOPLASTY  1983    There were no vitals filed for this visit.      Subjective Assessment - 10/14/15 1115    Subjective  Morning!   Patient is accompained by: Family member   Pertinent History see epic snapshot - ABI/hypoxia from [place]   Patient Stated Goals Pt unable to  state.    Currently in Pain? No/denies   Multiple Pain Sites No                      OT Treatments/Exercises (OP) - 10/14/15 0001      Neurological Re-education Exercises   Other Exercises 1 Working on improved postural control in sitting and standing.  Worked to encourage increased use of legs during sit to stand transitions.  Patient with much improved control until end ranges of hip, knee extension - then increased tremors.  Patient assisted to tall kneeling position to continue to address hip extension.  Patient here able to advance each leg with minimal assistance.  Patient with more difficulty weight shifting toward right side to advance left leg.  Patient with improved ability to correct midline orientation in all positions - especially sitting.                  OT Education - 10/14/15 1118    Education provided Yes   Education Details postural control in sitting, and during transitions   Person(s) Educated Patient;Spouse;Caregiver(s)   Methods Explanation;Demonstration   Comprehension Need further instruction          OT Short Term Goals - 09/26/15 1356  OT SHORT TERM GOAL #1   Title Pt and husband will be mod I with HEP - 10/21/2015   Status On-going     OT SHORT TERM GOAL #2   Title Pt will demonstrate adequate postural control for breath support to voice at least 3 words   Status On-going     OT SHORT TERM GOAL #3   Title Pt will transfer to commode with moderate assistance 75% of the time.   Status On-going     OT SHORT TERM GOAL #4   Title Pt will tolerate positional changes as evidenced by respiration rate and motor quieting in preparation for funcitonal mobility   Status On-going           OT Long Term Goals - 09/26/15 1357      OT LONG TERM GOAL #1   Title Pt and husband will be mod I with upgraded HEP - 11/18/2015   Status On-going     OT LONG TERM GOAL #2   Title Pt will demonstrate adequate postural control to support 5  words in supported sitting.   Status On-going     OT LONG TERM GOAL #3   Title Pt will demonstrate ability for low reach with RUE for functional task with  mod facilitation   Status On-going     OT LONG TERM GOAL #4   Title Pt will wash face with LUE with min a in supported sitting   Status On-going     OT LONG TERM GOAL #5   Title Pt will release objects with L hand with min faciltation    Status On-going               Plan - 10/14/15 1119    Clinical Impression Statement Patient showing improved postural control, midline orientation, and functional use of legs to transfer   Rehab Potential Fair   Clinical Impairments Affecting Rehab Potential severity of deficits, slow rate of recovery   OT Frequency 3x / week   OT Duration 8 weeks   OT Treatment/Interventions Self-care/ADL training;Ultrasound;Moist Heat;Electrical Stimulation;DME and/or AE instruction;Neuromuscular education;Therapeutic exercise;Functional Mobility Training;Manual Therapy;Passive range of motion;Splinting;Therapeutic activities;Balance training;Patient/family education;Visual/perceptual remediation/compensation;Cognitive remediation/compensation   Plan thoracic / hip extension, stand, kneel, modified 4 point   OT Home Exercise Plan Discussed with patient, with husband present, the improtance of flexing forward and using legs for transfers and standing   Consulted and Agree with Plan of Care Patient;Family member/caregiver   Family Member Consulted husband [name]      Patient will benefit from skilled therapeutic intervention in order to improve the following deficits and impairments:  Decreased endurance, Decreased skin integrity, Impaired vision/preception, Improper body mechanics, Impaired perceived functional ability, Decreased knowledge of precautions, Cardiopulmonary status limiting activity, Decreased activity tolerance, Decreased knowledge of use of DME, Decreased strength, Impaired flexibility, Improper  spinal/pelvic alignment, Impaired sensation, Difficulty walking, Decreased mobility, Decreased balance, Decreased cognition, Decreased range of motion, Impaired tone, Pain, Impaired UE functional use, Decreased safety awareness, Decreased coordination  Visit Diagnosis: Abnormal posture  Muscle weakness (generalized)  Other lack of coordination  Unsteadiness on feet  Stiffness of right shoulder, not elsewhere classified  Stiffness of left shoulder, not elsewhere classified  Frontal lobe and executive function deficit  Stiffness of left elbow, not elsewhere classified    Problem List Patient Active Problem List   Diagnosis Date Noted  . Tracheostomy status ([place]) 09/26/2015  . Allergic rhinitis 09/26/2015  . 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 Movement disorder 09/22/2015  .  Encephalitis 09/22/2015  . Chest pain 02/07/2014  . Premature atrial contractions 01/13/2014  . Abnormality of gait 12/04/2013  . Hyperlipidemia 12/21/2011  . Cardiovascular risk factor 12/21/2011  . Bunion 01/31/2008  . Metatarsalgia of both feet 09/20/2007  . FLAT FOOT 09/20/2007  . HALLUX RIGIDUS, ACQUIRED 09/20/2007    Mariah Milling, OTR/L 10/14/2015, 11:20 AM  [facility] [address] [place], [place], [address] Phone: [phone]   Fax:  [phone]  Name: Judy Spears MRN: [id number] Date of Birth: 10/19/1950

## 2015-10-14 NOTE — Telephone Encounter (Signed)
Arlys John, [facility] called to relay that patient had 120 cc residual this evening after water flush--this was tinged with tube feed.  Patient without N/V, abdominal distension and has had 2 BMs in past 24 hours. She held tube feed for four hours and resumed it from  8 pm to midnight. She had questions regarding water flushes as well as need for any changes.  Will have office will call back tomorrow .  Her phone # [phone] n follow up visit.

## 2015-10-15 ENCOUNTER — Ambulatory Visit: Payer: BC Managed Care – PPO | Admitting: Physical Therapy

## 2015-10-15 DIAGNOSIS — M6281 Muscle weakness (generalized): Secondary | ICD-10-CM

## 2015-10-15 DIAGNOSIS — R2689 Other abnormalities of gait and mobility: Secondary | ICD-10-CM

## 2015-10-15 DIAGNOSIS — R278 Other lack of coordination: Secondary | ICD-10-CM

## 2015-10-15 DIAGNOSIS — R293 Abnormal posture: Secondary | ICD-10-CM | POA: Diagnosis not present

## 2015-10-15 NOTE — Therapy (Signed)
[facility] [address] [place], [place], [address] Phone: [phone]   Fax:  [phone]  Physical Therapy Treatment  Patient Details  Name: Judy Spears MRN: [id number] Date of Birth: 1950-11-30 Referring Provider: Alger Simons, MD Sharion Dove, MD)  Encounter Date: 10/15/2015      PT End of Session - 10/15/15 1734    Visit Number 9   Number of Visits 25   Date for PT Re-Evaluation 11/22/15   Authorization Type BCBS   PT Start Time [id number]   PT Stop Time 1447   PT Time Calculation (min) 42 min   Activity Tolerance Patient tolerated treatment well;Other (comment)  required cueing to maintain arousal for ~5 minutes at end of session   Behavior During Therapy [facility] for tasks assessed/performed      Past Medical History:  Diagnosis Date  . Arthritis    lt great toe  . Complication of anesthesia    pt has had headaches post op-did advise to hydra well preop  . Hair loss    right-sided  . History of exercise stress test    a. ETT (12/15) with 12:00 exercise, no ST changes, occasional PACs.   . Hyperlipidemia   . Insomnia   . Migraine headache   . 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 Postmenopausal   . Premature atrial contractions    a. Holter (12/15) with 8% PACs, no atrial runs or atrial fibrillation.     Past Surgical History:  Procedure Laterality Date  . BREAST BIOPSY  01/01/2011   Procedure: BREAST BIOPSY WITH NEEDLE LOCALIZATION;  Surgeon: Rolm Bookbinder, MD;  Location: [facility];  Service: General;  Laterality: Left;  left breast wire localization biopsy  . cataracts right eye    . CESAREAN SECTION  1986  . HERNIA REPAIR  2000   RIH  . opirectinal membrane peel    . RHINOPLASTY  1983    There were no vitals filed for this visit.      Subjective Assessment - 10/15/15 1725    Subjective Caregiver, [name], present with patient today. [name] suspects pt has a UTI.  Reports husband will be taking patient to PCP tonight to give urine specimen.   Patient is accompained by: --  caregiver, [name]   Pertinent History Precautions: trach (capped), PEG, seizure. 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 No/denies                         [facility] Adult PT Treatment/Exercise - 10/15/15 0001      Bed Mobility   Bed Mobility Supine to Sit   Supine to Sit 2: Max assist   Supine to Sit Details (indicate cue type and reason) See NMR for details.   Sitting - Scoot to [employer] of Bed 2: Max assist   Sitting - Scoot to [employer] of Bed Details (indicate cue type and reason) cueing for full anterior weight shift, to maintain weight shift for longer, to activate LE's during anterior weight shift   Sit to Supine 2: Max assist   Sit to Supine - Details (indicate cue type and reason) See NMR for details  Transfers   Transfers Sit to Stand;Stand to Sit   Sit to Stand 3: Mod assist   Sit to Stand Details Tactile cues for initiation;Tactile cues for placement;Verbal cues for sequencing;Verbal cues for technique;Visual cues/gestures for sequencing;Manual facilitation for weight shifting;Manual facilitation for placement;Manual facilitation for weight bearing   Sit to Stand Details (indicate cue type and reason) multiple trials from neutral mat table height; multimodal cueing for full anterior weight shift; blocking anterior tibias. BUE support at therapists shoulders (anterior to pt)   Sit to Stand: Patient Percentage 70%   Stand to Sit 3: Mod assist   Stand to Sit Details cueing for anterior weight shift   Squat Pivot Transfers 2: Max assist   Squat Pivot Transfer Details (indicate  cue type and reason) from w/c <>mat table     Ambulation/Gait   Ambulation/Gait No     Neuro Re-ed    Neuro Re-ed Details  Pt performed supine <> sit to/from R side x3 reps for increased pelvic dissociation, WB on R side (for activation and proprioceptive input), for active closed-chain shortening of trunk on R side. Utilized logroll technique for all supine > sit transfers; logroll technique not used for sit > supine after initial trial due to noted R beating nystagmus, signs of pt discomfort in R sidelying. Required mod A, cueing to maintain anterior weigh shift during transition from sit > R side lying. Pt able to initiate BLE management consistently, but required mod A to place BLE's on mat table. Min verbal/demo cueing also required for LUE movement across midline to initiate supine > R side lying.  For R side lying > sit, pt required subtle-min cueing and increased time to advance BLE's toward EOM; min guard-min A to maintain trunk position (to prevent posterior LOB) during transition from R side lying > sit.                  PT Short Term Goals - 09/23/15 2058      PT SHORT TERM GOAL #1   Title Pt will initiate anterior weight shift sufficient to unweight hips with supervision, 25% cueing in preparation for functional transfer.  (Target date: 10/21/15)     PT SHORT TERM GOAL #2   Title Pt will perform seated scooting (AP/PA and M/L) with supervision, 25% cueing to increase independence with transfers, repositioning. (10/21/15)     PT SHORT TERM GOAL #3   Title Pt will tolerate static standing with total A of standing frame x10 minutes with vital signs WNL to improve standing tolerance and for tone management.  (10/21/15)     PT SHORT TERM GOAL #4   Title Pt will perform dynamic sitting >/= 5 minutes with BLE's supported, intermittent UE support with CGA to indicate improved postural stability/control necessary for functional transfers. (10/21/15)     PT SHORT TERM GOAL #5    Title Pt will perform sit <> side lying with mod A, 50% cueing to progress toward increased independence with bed mobility.  (10/21/15)     Additional Short Term Goals   Additional Short Term Goals Yes     PT SHORT TERM GOAL #6   Title Pt will consistently perform sit <> stand with min A, 25% cueing to indicate increased independence with functional transfers.  (10/21/15)     PT SHORT TERM GOAL #7   Title Pt will perform level transfer from w/c <> mat table with mod A, 50% cueing to decrease caregiver burden with functional transfers.  (10/21/15)  PT Long Term Goals - 09/23/15 2120      PT LONG TERM GOAL #1   Title Pt will perform supine > sit with mod A, 50% cueing to indicate increased independence with getting out of bed.  (Target: 11/18/15)     PT LONG TERM GOAL #2   Title Pt will perform sit > supine with mod A, 50% cueing to indicate increased independence with getting into bed.  (11/18/15)     PT LONG TERM GOAL #3   Title Pt will perform level transfer from w/c <> mat table with min A, 25% cueing to decrease caregiver burden with functional transfers.  (11/18/15)     PT LONG TERM GOAL #4   Title Pt will perform dynamic standing >/= 2 consecutive minutes with BUE support and mod, 50% cueing to indicate improved postural stanbility/control.   (11/18/15)     PT LONG TERM GOAL #5   Title Patient will ambulate x15' with max A of spouse using LRAD to enable pt/spouse to safely walk into/out of home bathroom.  (11/18/15)               Plan - 10/15/15 1734    Clinical Impression Statement Session focused on bed mobility to increase active shortening of trunk on R in closed chain, to increase pt independence with getting into/OOB at home. Pt initiated supine > sit with less cueing and was able to perform >50% of self-management of BLE's during sit > supine during this session, both of which were improvements from last session during which bed mobility was addressed. Pt  did require cueing to maintain arousal for ~5 minutes at end of session. Caregiver reports pt may have UTI at this time.    Rehab Potential Good   Clinical Impairments Affecting Rehab Potential Positive: strong family/social support; negative: chronicity of condition   PT Frequency 3x / week   PT Duration 8 weeks   PT Treatment/Interventions ADLs/Self Care Home Management;DME Instruction;Gait training;Stair training;Functional mobility training;Therapeutic activities;Patient/family education;Cognitive remediation;Neuromuscular re-education;Therapeutic exercise;Balance training;Orthotic Fit/Training;Wheelchair mobility training;Manual techniques;Splinting;Visual/perceptual remediation/compensation;Vestibular;Passive range of motion   PT Next Visit Plan If skilled +2A available: quadruped, gait with AFO's, physioball for pelvic mobility/dissociation.  If no skilled +2A: standing frame, partial standing with BUE support, sit <> stand, seated scooting, supine bridging to scoot, seated reaching for anterior weight shift, or bed mobility.   Consulted and Agree with Plan of Care Patient;Other (Comment)   Family Member Consulted caregiver, [name]      Patient will benefit from skilled therapeutic intervention in order to improve the following deficits and impairments:  Abnormal gait, Decreased balance, Decreased endurance, Decreased cognition, Cardiopulmonary status limiting activity, Decreased activity tolerance, Decreased coordination, Decreased strength, Impaired flexibility, Impaired tone, Decreased mobility, Decreased skin integrity, Increased muscle spasms, Impaired vision/preception, Decreased range of motion, Impaired UE functional use, Postural dysfunction, Pain  Visit Diagnosis: Other abnormalities of gait and mobility  Other lack of coordination  Abnormal posture  Muscle weakness (generalized)     Problem List Patient Active Problem List   Diagnosis Date Noted  . Tracheostomy status  ([place]) 09/26/2015  . Allergic rhinitis 09/26/2015  . 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 Movement disorder 09/22/2015  . Encephalitis 09/22/2015  . Chest pain 02/07/2014  . Premature atrial contractions 01/13/2014  . Abnormality of gait 12/04/2013  . Hyperlipidemia 12/21/2011  . Cardiovascular risk factor 12/21/2011  . Bunion 01/31/2008  . Metatarsalgia of both feet 09/20/2007  . FLAT FOOT 09/20/2007  . HALLUX RIGIDUS, ACQUIRED 09/20/2007  Billie Ruddy, PT, DPT [facility] [address] [place], [place], [address] Phone: [phone]   Fax:  [phone] 10/15/15, 5:39 PM  Name: Judy Spears MRN: [id number] Date of Birth: May 10, 1950

## 2015-10-16 ENCOUNTER — Encounter: Payer: Self-pay | Admitting: Occupational Therapy

## 2015-10-16 ENCOUNTER — Ambulatory Visit: Payer: BC Managed Care – PPO

## 2015-10-16 ENCOUNTER — Ambulatory Visit: Payer: BC Managed Care – PPO | Admitting: Physical Therapy

## 2015-10-16 ENCOUNTER — Ambulatory Visit: Payer: BC Managed Care – PPO | Admitting: Occupational Therapy

## 2015-10-16 ENCOUNTER — Telehealth: Payer: Self-pay | Admitting: *Deleted

## 2015-10-16 DIAGNOSIS — M25611 Stiffness of right shoulder, not elsewhere classified: Secondary | ICD-10-CM

## 2015-10-16 DIAGNOSIS — R2689 Other abnormalities of gait and mobility: Secondary | ICD-10-CM

## 2015-10-16 DIAGNOSIS — R293 Abnormal posture: Secondary | ICD-10-CM | POA: Diagnosis not present

## 2015-10-16 DIAGNOSIS — R278 Other lack of coordination: Secondary | ICD-10-CM

## 2015-10-16 DIAGNOSIS — M25612 Stiffness of left shoulder, not elsewhere classified: Secondary | ICD-10-CM

## 2015-10-16 DIAGNOSIS — R41844 Frontal lobe and executive function deficit: Secondary | ICD-10-CM

## 2015-10-16 DIAGNOSIS — R471 Dysarthria and anarthria: Secondary | ICD-10-CM

## 2015-10-16 DIAGNOSIS — R2681 Unsteadiness on feet: Secondary | ICD-10-CM

## 2015-10-16 DIAGNOSIS — M6281 Muscle weakness (generalized): Secondary | ICD-10-CM

## 2015-10-16 DIAGNOSIS — R41841 Cognitive communication deficit: Secondary | ICD-10-CM

## 2015-10-16 DIAGNOSIS — M25622 Stiffness of left elbow, not elsewhere classified: Secondary | ICD-10-CM

## 2015-10-16 DIAGNOSIS — R1312 Dysphagia, oropharyngeal phase: Secondary | ICD-10-CM

## 2015-10-16 NOTE — Therapy (Signed)
[facility] [address] [place], [place], [address] Phone: [phone]   Fax:  [phone]  Physical Therapy Treatment  Patient Details  Name: Judy Spears MRN: [id number] Date of Birth: 28-Sep-1950 Referring Provider: Alger Simons, MD Sharion Dove, MD)  Encounter Date: 10/16/2015      PT End of Session - 10/16/15 1649    Visit Number 10   Number of Visits 25   Date for PT Re-Evaluation 11/22/15   Authorization Type BCBS   PT Start Time 1534   PT Stop Time 1616   PT Time Calculation (min) 42 min   Activity Tolerance Patient tolerated treatment well   Behavior During Therapy [facility] for tasks assessed/performed      Past Medical History:  Diagnosis Date  . Arthritis    lt great toe  . Complication of anesthesia    pt has had headaches post op-did advise to hydra well preop  . Hair loss    right-sided  . History of exercise stress test    a. ETT (12/15) with 12:00 exercise, no ST changes, occasional PACs.   . Hyperlipidemia   . Insomnia   . Migraine headache   . 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 Postmenopausal   . Premature atrial contractions    a. Holter (12/15) with 8% PACs, no atrial runs or atrial fibrillation.     Past Surgical History:  Procedure Laterality Date  . BREAST BIOPSY  01/01/2011   Procedure: BREAST BIOPSY WITH NEEDLE LOCALIZATION;  Surgeon: Rolm Bookbinder, MD;  Location: [place];  Service: General;  Laterality: Left;  left breast wire localization biopsy  . cataracts right eye    . CESAREAN SECTION  1986  . HERNIA REPAIR  2000   RIH  . opirectinal membrane peel    . RHINOPLASTY  1983    There were no vitals filed for this visit.      Subjective Assessment - 10/16/15 1636    Subjective Caregiver reports pt does in fact have a UTI. Started macrobid last night.    Patient is accompained by: --  caregiver, [name]   Pertinent History Precautions:  trach (capped), PEG, seizure. 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 No/denies            NMR: - Seated EOM, pt engaged in tapping balloon with dowel (held in BUE's) x20 reps with BLE's supported, min guard-min A for postural control, and min manual assist to elevate RUE. - Transitioned from standing with +2A (3 musketeers assist) to tall kneeling on mat table; initially with BUE support at standard chair, progressing with single R UE support at standard chair while engaging in game of balloon tap with LUE. PT providing manual facilitation of B hip extension, min-mod A (occasional max A to recover from anterior LOB). Transitioned to tall kneeling with B HHA of therapist (positioned anterior to pt) with concurrent lateral stepping x2 reps per direction, forward stepping x3 steps per direction with manual facilitation/redirection of knees to avoid excessive hip adduction. Approx. 20 minutes spent in tall kneeling with 2 rest breaks (BUE support on standard chair seat). - Transitioned to quadruped for approx. 5 minutes  with max A to maintain B elbow extension (especially on L side) sufficient to maintain quadruped. With mod-max A, pt performed walking hands outward, inward x4 reps per side in quadruped. - Blocked practice of sit <> stand from raised EOM with min-mod A, B HHA of SPT (positioned anterior to pt, facing pt); PT posterior to pt providing tactile cueing at B ribcage.                        PT Short Term Goals - 10/16/15 1651      PT SHORT TERM GOAL #1   Title Pt will initiate anterior weight shift sufficient to unweight hips with supervision, 25% cueing in preparation for  functional transfer.  (Target date: 10/21/15)   Status On-going     PT SHORT TERM GOAL #2   Title Pt will perform seated scooting (AP/PA and M/L) with supervision, 25% cueing to increase independence with transfers, repositioning. (10/21/15)   Status On-going     PT SHORT TERM GOAL #3   Title Pt will tolerate static standing with total A of standing frame x10 minutes with vital signs WNL to improve standing tolerance and for tone management.  (10/21/15)   Status Achieved     PT SHORT TERM GOAL #4   Title Pt will perform dynamic sitting >/= 5 minutes with BLE's supported, intermittent UE support with CGA to indicate improved postural stability/control necessary for functional transfers. (10/21/15)   Status On-going     PT SHORT TERM GOAL #5   Title Pt will perform sit <> side lying with mod A, 50% cueing to progress toward increased independence with bed mobility.  (10/21/15)   Status On-going     PT SHORT TERM GOAL #6   Title Pt will consistently perform sit <> stand with min A, 25% cueing to indicate increased independence with functional transfers.  (10/21/15)   Status On-going     PT SHORT TERM GOAL #7   Title Pt will perform level transfer from w/c <> mat table with mod A, 50% cueing to decrease caregiver burden with functional transfers.  (10/21/15)   Status On-going           PT Long Term Goals - 09/23/15 2120      PT LONG TERM GOAL #1   Title Pt will perform supine > sit with mod A, 50% cueing to indicate increased independence with getting out of bed.  (Target: 11/18/15)     PT LONG TERM GOAL #2   Title Pt will perform sit > supine with mod A, 50% cueing to indicate increased independence with getting into bed.  (11/18/15)     PT LONG TERM GOAL #3   Title Pt will perform level transfer from w/c <> mat table with min A, 25% cueing to decrease caregiver burden with functional transfers.  (11/18/15)     PT LONG TERM GOAL #4   Title Pt will perform dynamic standing >/= 2  consecutive minutes with BUE support and mod, 50% cueing to indicate improved postural stanbility/control.   (11/18/15)     PT LONG TERM GOAL #5   Title Patient will ambulate x15' with max A of spouse using LRAD to enable pt/spouse to safely walk into/out of home bathroom.  (11/18/15)               Plan - 10/16/15 1649    Clinical Impression Statement Skilled session focused on use of closed chain/WB positions to increase proximal  stability/control, to improve selective control of B hip extension. Pt demonstrated decreased arousal with static/dynamic sitting; was consistently awake/alert when in tall kneeling.    Rehab Potential Good   Clinical Impairments Affecting Rehab Potential Positive: strong family/social support; negative: chronicity of condition   PT Frequency 3x / week   PT Duration 8 weeks   PT Treatment/Interventions ADLs/Self Care Home Management;DME Instruction;Gait training;Stair training;Functional mobility training;Therapeutic activities;Patient/family education;Cognitive remediation;Neuromuscular re-education;Therapeutic exercise;Balance training;Orthotic Fit/Training;Wheelchair mobility training;Manual techniques;Splinting;Visual/perceptual remediation/compensation;Vestibular;Passive range of motion   PT Next Visit Plan Try automatic tasks (ballon tap). Seated scooting (forearm propping + reciprocal scooting; A/P scooting with B forearm support on raised surface). If skilled +2A available: quadruped, gait with AFO's, physioball for pelvic mobility/dissociation.  If no skilled +2A: standing frame, partial standing with BUE support, sit <> stand, seated scooting, supine bridging to scoot, seated reaching for anterior weight shift, or bed mobility.   Consulted and Agree with Plan of Care Patient;Other (Comment)   Family Member Consulted caregiver, [name]      Patient will benefit from skilled therapeutic intervention in order to improve the following deficits and impairments:   Abnormal gait, Decreased balance, Decreased endurance, Decreased cognition, Cardiopulmonary status limiting activity, Decreased activity tolerance, Decreased coordination, Decreased strength, Impaired flexibility, Impaired tone, Decreased mobility, Decreased skin integrity, Increased muscle spasms, Impaired vision/preception, Decreased range of motion, Impaired UE functional use, Postural dysfunction, Pain  Visit Diagnosis: Other abnormalities of gait and mobility  Other lack of coordination  Abnormal posture  Muscle weakness (generalized)     Problem List Patient Active Problem List   Diagnosis Date Noted  . Tracheostomy status ([place]) 09/26/2015  . Allergic rhinitis 09/26/2015  . 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 Movement disorder 09/22/2015  . Encephalitis 09/22/2015  . Chest pain 02/07/2014  . Premature atrial contractions 01/13/2014  . Abnormality of gait 12/04/2013  . Hyperlipidemia 12/21/2011  . Cardiovascular risk factor 12/21/2011  . Bunion 01/31/2008  . Metatarsalgia of both feet 09/20/2007  . FLAT FOOT 09/20/2007  . HALLUX RIGIDUS, ACQUIRED 09/20/2007    Billie Ruddy, PT, DPT [facility] [address] [place], [place], [address] Phone: [phone]   Fax:  [phone] 10/16/15, 5:06 PM  Name: Judy Spears MRN: [id number] Date of Birth: 21-Apr-1950

## 2015-10-16 NOTE — Therapy (Signed)
[facility] [address] [place], [place], [address] Phone: [phone]   Fax:  [phone]  Occupational Therapy Treatment  Patient Details  Name: Judy Spears MRN: [id number] Date of Birth: 1950/04/10 Referring Provider: Dr. Sharion Dove  Encounter Date: 10/16/2015      OT End of Session - 10/16/15 2027    Visit Number 9   Number of Visits 24   Date for OT Re-Evaluation 11/18/15   Authorization Type BCBS unlimited visits   OT Start Time 1400   OT Stop Time 1445   OT Time Calculation (min) 45 min   Activity Tolerance Patient tolerated treatment well   Behavior During Therapy Restless      Past Medical History:  Diagnosis Date  . Arthritis    lt great toe  . Complication of anesthesia    pt has had headaches post op-did advise to hydra well preop  . Hair loss    right-sided  . History of exercise stress test    a. ETT (12/15) with 12:00 exercise, no ST changes, occasional PACs.   . Hyperlipidemia   . Insomnia   . Migraine headache   . 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 Postmenopausal   . Premature atrial contractions    a. Holter (12/15) with 8% PACs, no atrial runs or atrial fibrillation.     Past Surgical History:  Procedure Laterality Date  . BREAST BIOPSY  01/01/2011   Procedure: BREAST BIOPSY WITH NEEDLE LOCALIZATION;  Surgeon: Rolm Bookbinder, MD;  Location: [facility];  Service: General;  Laterality: Left;  left breast wire localization biopsy  . cataracts right eye    . CESAREAN SECTION  1986  . HERNIA REPAIR  2000   RIH  . opirectinal membrane peel    . RHINOPLASTY  1983    There were no vitals filed for this visit.      Subjective Assessment - 10/16/15 2010    Subjective  Patient indicated need to void, patient with recent UTI   Patient is accompained by: --  caregiver- Judy Spears   Pertinent History see epic snapshot - ABI/hypoxia from [place]   Patient Stated Goals Pt unable to state.    Currently in Pain? No/denies   Multiple Pain Sites No                      OT Treatments/Exercises (OP) - 10/16/15 0001      ADLs   Toileting Patient with need to void this session, and caregiver indicates that this has been very challenging in bathroom in patient;'s home.  Discussed options with caregiver in patient's presence.  Patient assisted to stand so caregiver could adjust clothing.  Patient able to make attempt for hygiene after voiding, caregiver required to complete task.  While seated, patient able to reach below her knees to effectively pull pants up to thighs, and in standing attempted to pull pants over hips.  Emphasis on forward weight shift onto feet for squat pivot transfer back to wheelchair.  Patient assisted to stand frame for  speech therapy session.                  OT Education - 10/16/15 2025    Education provided Yes   Education Details possible set up of bedside commode in office to increase accessibility for toileting at home   Person(s) Educated Patient;Caregiver(s)   Methods Explanation;Demonstration   Comprehension  Need further instruction          OT Short Term Goals - 10/16/15 2031      OT SHORT TERM GOAL #1   Title Pt and husband will be mod I with HEP - 10/21/2015   Status On-going     OT SHORT TERM GOAL #2   Title Pt will demonstrate adequate postural control for breath support to voice at least 3 words   Status Achieved     OT SHORT TERM GOAL #3   Title Pt will transfer to commode with moderate assistance 75% of the time.   Status On-going     OT SHORT TERM GOAL #4   Title Pt will tolerate positional changes as evidenced by respiration rate and motor quieting in preparation for funcitonal mobility   Status Achieved           OT Long Term Goals - 09/26/15 1357      OT LONG TERM GOAL #1   Title Pt and husband will be mod I with upgraded HEP - 11/18/2015   Status  On-going     OT LONG TERM GOAL #2   Title Pt will demonstrate adequate postural control to support 5 words in supported sitting.   Status On-going     OT LONG TERM GOAL #3   Title Pt will demonstrate ability for low reach with RUE for functional task with  mod facilitation   Status On-going     OT LONG TERM GOAL #4   Title Pt will wash face with LUE with min a in supported sitting   Status On-going     OT LONG TERM GOAL #5   Title Pt will release objects with L hand with min faciltation    Status On-going               Plan - 10/16/15 2029    Clinical Impression Statement Patient with UTI, increased discomfort / fatigue noted this sesison   Rehab Potential Fair   Clinical Impairments Affecting Rehab Potential severity of deficits, slow rate of recovery   OT Frequency 3x / week   OT Duration 8 weeks   OT Treatment/Interventions Self-care/ADL training;Ultrasound;Moist Heat;Electrical Stimulation;DME and/or AE instruction;Neuromuscular education;Therapeutic exercise;Functional Mobility Training;Manual Therapy;Passive range of motion;Splinting;Therapeutic activities;Balance training;Patient/family education;Visual/perceptual remediation/compensation;Cognitive remediation/compensation   Plan thoracic / hip extension, toilet transfers,    Consulted and Agree with Plan of Care Patient;Family member/caregiver   Family Member Consulted caregiver Judy Spears      Patient will benefit from skilled therapeutic intervention in order to improve the following deficits and impairments:  Decreased endurance, Decreased skin integrity, Impaired vision/preception, Improper body mechanics, Impaired perceived functional ability, Decreased knowledge of precautions, Cardiopulmonary status limiting activity, Decreased activity tolerance, Decreased knowledge of use of DME, Decreased strength, Impaired flexibility, Improper spinal/pelvic alignment, Impaired sensation, Difficulty walking, Decreased mobility,  Decreased balance, Decreased cognition, Decreased range of motion, Impaired tone, Pain, Impaired UE functional use, Decreased safety awareness, Decreased coordination  Visit Diagnosis: Other lack of coordination  Abnormal posture  Muscle weakness (generalized)  Unsteadiness on feet  Stiffness of right shoulder, not elsewhere classified  Stiffness of left shoulder, not elsewhere classified  Frontal lobe and executive function deficit  Stiffness of left elbow, not elsewhere classified    Problem List Patient Active Problem List   Diagnosis Date Noted  . Tracheostomy status ([place]) 09/26/2015  . Allergic rhinitis 09/26/2015  . 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 Movement disorder 09/22/2015  . Encephalitis 09/22/2015  . Chest pain  02/07/2014  . Premature atrial contractions 01/13/2014  . Abnormality of gait 12/04/2013  . Hyperlipidemia 12/21/2011  . Cardiovascular risk factor 12/21/2011  . Bunion 01/31/2008  . Metatarsalgia of both feet 09/20/2007  . FLAT FOOT 09/20/2007  . HALLUX RIGIDUS, ACQUIRED 09/20/2007    Mariah Milling 10/16/2015, 8:34 PM  [facility] [address] [place], [place], [address] Phone: [phone]   Fax:  [phone]  Name: REVE WAREING MRN: [id number] Date of Birth: [birth year]

## 2015-10-16 NOTE — Telephone Encounter (Addendum)
-----   Message from Bary Leriche, PA-C sent at 10/14/2015  9:25 PM EDT ----- Regarding: tube feeds Please call [facility] tomorrow. She was wanted to clarify the amount of water flushes. She said patient tolerating tube feeds but she was concerned that residual was tinged with tube feed. Her phone # [phone]   Dr [name] please advise if we are managing this as well

## 2015-10-17 ENCOUNTER — Ambulatory Visit: Payer: BC Managed Care – PPO

## 2015-10-17 ENCOUNTER — Encounter: Payer: Self-pay | Admitting: Occupational Therapy

## 2015-10-17 ENCOUNTER — Other Ambulatory Visit: Payer: Self-pay | Admitting: Cardiology

## 2015-10-17 ENCOUNTER — Ambulatory Visit: Payer: BC Managed Care – PPO | Admitting: Physical Therapy

## 2015-10-17 ENCOUNTER — Ambulatory Visit: Payer: BC Managed Care – PPO | Admitting: Occupational Therapy

## 2015-10-17 DIAGNOSIS — M6281 Muscle weakness (generalized): Secondary | ICD-10-CM

## 2015-10-17 DIAGNOSIS — R278 Other lack of coordination: Secondary | ICD-10-CM

## 2015-10-17 DIAGNOSIS — R1312 Dysphagia, oropharyngeal phase: Secondary | ICD-10-CM

## 2015-10-17 DIAGNOSIS — R2681 Unsteadiness on feet: Secondary | ICD-10-CM

## 2015-10-17 DIAGNOSIS — R293 Abnormal posture: Secondary | ICD-10-CM | POA: Diagnosis not present

## 2015-10-17 DIAGNOSIS — M25612 Stiffness of left shoulder, not elsewhere classified: Secondary | ICD-10-CM

## 2015-10-17 DIAGNOSIS — R41841 Cognitive communication deficit: Secondary | ICD-10-CM

## 2015-10-17 DIAGNOSIS — M25622 Stiffness of left elbow, not elsewhere classified: Secondary | ICD-10-CM

## 2015-10-17 DIAGNOSIS — R2689 Other abnormalities of gait and mobility: Secondary | ICD-10-CM

## 2015-10-17 DIAGNOSIS — M25611 Stiffness of right shoulder, not elsewhere classified: Secondary | ICD-10-CM

## 2015-10-17 DIAGNOSIS — R41844 Frontal lobe and executive function deficit: Secondary | ICD-10-CM

## 2015-10-17 DIAGNOSIS — R471 Dysarthria and anarthria: Secondary | ICD-10-CM

## 2015-10-17 NOTE — Therapy (Signed)
[facility] [address] [place], [place], [address] Phone: [phone]   Fax:  [phone]  Speech Language Pathology Treatment  Patient Details  Name: Judy Spears MRN: [id number] Date of Birth: Mar 16, 1950 Referring Provider: Dr. Alger Simons  Encounter Date: 10/16/2015      End of Session - 10/17/15 0849    Visit Number 10   Number of Visits 24   Date for SLP Re-Evaluation 11/18/15   SLP Start Time 1450   SLP Stop Time  1530   SLP Time Calculation (min) 40 min   Activity Tolerance Patient tolerated treatment well      Past Medical History:  Diagnosis Date  . Arthritis    lt great toe  . Complication of anesthesia    pt has had headaches post op-did advise to hydra well preop  . Hair loss    right-sided  . History of exercise stress test    a. ETT (12/15) with 12:00 exercise, no ST changes, occasional PACs.   . Hyperlipidemia   . Insomnia   . Migraine headache   . 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 Postmenopausal   . Premature atrial contractions    a. Holter (12/15) with 8% PACs, no atrial runs or atrial fibrillation.     Past Surgical History:  Procedure Laterality Date  . BREAST BIOPSY  01/01/2011   Procedure: BREAST BIOPSY WITH NEEDLE LOCALIZATION;  Surgeon: Rolm Bookbinder, MD;  Location: [facility];  Service: General;  Laterality: Left;  left breast wire localization biopsy  . cataracts right eye    . CESAREAN SECTION  1986  . HERNIA REPAIR  2000   RIH  . opirectinal membrane peel    . RHINOPLASTY  1983    There were no vitals filed for this visit.      Subjective Assessment - 10/16/15 1538    Subjective "Hi" (aphonic) after second greeting from SLP   Currently in Pain? --  no overt indications               ADULT SLP TREATMENT - 10/17/15 0001      General Information   Behavior/Cognition Cooperative;Lethargic;Distractible;Requires  cueing;Doesn't follow directions;Decreased sustained attention;Confused   Patient Positioning Other (comment)  in stander device     Treatment Provided   Treatment provided Dysphagia;Cognitive-Linquistic     Dysphagia Treatment   Temperature Spikes Noted No   Oral Cavity - Dentition Adequate natural dentition   Treatment Methods Skilled observation   Patient observed directly with PO's Yes   Type of PO's observed Ice chips   Oral Phase Signs & Symptoms Anterior loss/spillage;Oral holding   Pharyngeal Phase Signs & Symptoms Delayed cough;Suspected delayed swallow initiation;Wet vocal quality   Type of cueing Verbal;Tactile;Visual   Amount of cueing Maximal   Other treatment/comments Pt with UTI. SLP stimulated submental musculature with cold spoon as well as other tactile stimulation - digital manipulation. Pt swallowed in 1/8 trials with ice chips, with delayed cough x2, anterior labial leakage x5, likely due to positioning of leaning forward in stander device. SLP did not provide pt ice chips when pt in somnolent state.     Cognitive-Linquistic Treatment   Treatment focused on Dysarthria;Voice;Aphasia;Apraxia   Skilled Treatment Audible phonation facilitated with repeition of "Hey", "Hey [name]", loud /a/, and counting 1-3 with max verbal, demo, visual, and tactile cues. Spontaneous speech intelligible less than 20% of the time. Pt was somnolent most of session  in the standing device, 20-25 minutes of session spent trying to arouse pt.  Pt answered yes/no questions (simple, personal) with 40% frequency, no responses with verbalization.     Assessment / Recommendations / Plan   Plan Continue with current plan of care     Progression Toward Goals   Progression toward goals --  pt appears motivated for meeting goals            SLP Short Term Goals - 10/17/15 0851      SLP SHORT TERM GOAL #1   Title Pt will demonstrate audible phonation with prolonged vowels and cv syllables 12/15  trials with occasional min A   Time 1   Period Weeks   Status On-going     SLP SHORT TERM GOAL #2   Title Pt will demonstrate breath support prior to repetition of words 8/10x   Time 1   Period Weeks   Status On-going     SLP SHORT TERM GOAL #3   Title Pt will swallow ice chip/H20 via spoon in less than 7 seconds with usual min to mod A 3/5 trials   Time 1   Period Weeks   Status On-going     SLP SHORT TERM GOAL #4   Title Pt will sustain attention to therapy task for over 7 minutes with occasional min A   Time 1   Period Weeks   Status On-going          SLP Long Term Goals - 10/17/15 [id number]      SLP LONG TERM GOAL #1   Title Pt will demonstrate audible phonation/intelligible speech with 2 word phrases with occasional min A 5/7 utterances   Time 5   Period Weeks   Status On-going     SLP LONG TERM GOAL #2   Title Pt will demonstrate audible cough/throat clear to command and to clear secretions with occasional min A 4/5 trials.    Time 5   Period Weeks   Status On-going     SLP LONG TERM GOAL #3   Title Pt will achieve timely oral phase and swallow with ice chip/ water trials 5/7 trials   Time 5   Period Weeks   Status On-going     SLP LONG TERM GOAL #4   Title Pt will sustain attention to therapy tasks for 12 minutes with occasiona min A   Time 5   Period Weeks   Status On-going          Plan - 10/16/15 1649    Clinical Impression Statement Pt seen in stander device today, without notable change in alertness level. Max A - tactile, visual, verbal cues for audible phonation with success <20% of the time. Continue skilled ST to maximize intellgibility, attention, and swallow .    Speech Therapy Frequency 3x / week   Duration --  5 weeks or 16 visits   Treatment/Interventions Group dysphagia treatment;Pharyngeal strengthening exercises;Aspiration precaution training;Environmental controls;Compensatory techniques;Internal/external aids;Cognitive  reorganization;Multimodal communcation approach;SLP instruction and feedback;Cueing hierarchy;Functional tasks;Patient/family education;Oral motor exercises   Potential to Achieve Goals Fair   Potential Considerations Ability to learn/carryover information;Co-morbidities;Severity of impairments   Consulted and Agree with Plan of Care Family member/caregiver   Family Member Consulted [name] - caregiver, [name]- spouse      Patient will benefit from skilled therapeutic intervention in order to improve the following deficits and impairments:   Dysphagia, oropharyngeal phase  Dysarthria and anarthria  Cognitive communication deficit    Problem List Patient  Active Problem List   Diagnosis Date Noted  . Tracheostomy status ([place]) 09/26/2015  . Allergic rhinitis 09/26/2015  . 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 Movement disorder 09/22/2015  . Encephalitis 09/22/2015  . Chest pain 02/07/2014  . Premature atrial contractions 01/13/2014  . Abnormality of gait 12/04/2013  . Hyperlipidemia 12/21/2011  . Cardiovascular risk factor 12/21/2011  . Bunion 01/31/2008  . Metatarsalgia of both feet 09/20/2007  . FLAT FOOT 09/20/2007  . HALLUX RIGIDUS, ACQUIRED 09/20/2007    [facility] ,MS, CCC-SLP  10/17/2015, 8:52 AM  [facility] [address] [place], [place], [address] Phone: [phone]   Fax:  [phone]   Name: Judy Spears MRN: [id number] Date of Birth: 05-30-1950

## 2015-10-17 NOTE — Therapy (Signed)
[facility] [address] [place], [place], [address] Phone: [phone]   Fax:  [phone]  Speech Language Pathology Treatment  Patient Details  Name: Judy Spears MRN: [id number] Date of Birth: [birth year] Referring Provider: Dr. Faith Rogue  Encounter Date: 10/17/2015      End of Session - 10/17/15 1702    Visit Number 11   Number of Visits 24   Date for SLP Re-Evaluation 11/18/15   SLP Start Time 1450   SLP Stop Time  1531   SLP Time Calculation (min) 41 min   Activity Tolerance Patient tolerated treatment well;Patient limited by lethargy      Past Medical History:  Diagnosis Date  . Arthritis    lt great toe  . Complication of anesthesia    pt has had headaches post op-did advise to hydra well preop  . Hair loss    right-sided  . History of exercise stress test    a. ETT (12/15) with 12:00 exercise, no ST changes, occasional PACs.   . Hyperlipidemia   . Insomnia   . Migraine headache   . 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 Postmenopausal   . Premature atrial contractions    a. Holter (12/15) with 8% PACs, no atrial runs or atrial fibrillation.     Past Surgical History:  Procedure Laterality Date  . BREAST BIOPSY  01/01/2011   Procedure: BREAST BIOPSY WITH NEEDLE LOCALIZATION;  Surgeon: Emelia Loron, MD;  [facility]  [facility];  Service: General;  Laterality: Left;  left breast wire localization biopsy  . cataracts right eye    . CESAREAN SECTION  1986  . HERNIA REPAIR  2000   RIH  . opirectinal membrane peel    . RHINOPLASTY  1983    There were no vitals filed for this visit.      Subjective Assessment - 10/17/15 1533    Subjective "Hey" (aphonic) after first attempt of SLP greeting with "Hey!"   Patient is accompained by: --  brother and sister in law               ADULT SLP TREATMENT - 10/17/15 1656      General Information    Behavior/Cognition Cooperative;Lethargic;Distractible;Requires cueing;Doesn't follow directions;Decreased sustained attention;Confused     Treatment Provided   Treatment provided Dysphagia;Cognitive-Linquistic     Dysphagia Treatment   Temperature Spikes Noted No   Oral Cavity - Dentition Adequate natural dentition   Treatment Methods Skilled observation;Therapeutic exercise   Patient observed directly with PO's No   Type of PO's observed --  lemon glycerine swabs   Oral Phase Signs & Symptoms Anterior loss/spillage;Oral holding   Pharyngeal Phase Signs & Symptoms Suspected delayed swallow initiation;Wet vocal quality;Delayed throat clear   Type of cueing Verbal;Tactile;Visual   Amount of cueing Moderate   Other treatment/comments Pt swallowed 4/6 on command following lemon-glycerine swab rolled on tongue and/or mainuplated lt-rt by pt's tongue with min A from SLP to keep swab in mouth. Pt with labial leakage following swallows so SLP suspects swallows were weak in nature. Pt cont to exhibit labial leakage during session, intermittently.      Cognitive-Linquistic Treatment   Treatment focused on Dysarthria;Voice;Aphasia;Apraxia   Skilled Treatment Pt alert for approx 30 minutes of today's session, fatigue noted last 10-15 minutes. Pt repeated audible 1-word utterances and responded audible "yes" or "no" 50% of the time with min-mod cues, an dincr'd to 100% with max cues.  Pt with one word audible spontaneous speech x3, in response to SLP comments.     Assessment / Recommendations / Plan   Plan Continue with current plan of care     Progression Toward Goals   Progression toward goals Progressing toward goals            SLP Short Term Goals - 10/17/15 1703      SLP SHORT TERM GOAL #1   Title Pt will demonstrate audible phonation with prolonged vowels and cv syllables 12/15 trials with occasional min A   Time 1   Period Weeks   Status Not Met     SLP SHORT TERM GOAL #2   Title  Pt will demonstrate breath support prior to repetition of words 8/10x   Time 1   Period Weeks   Status Not Met     SLP SHORT TERM GOAL #3   Title Pt will swallow ice chip/H20 via spoon in less than 7 seconds with usual min to mod A 3/5 trials   Time 1   Period Weeks   Status Not Met     SLP SHORT TERM GOAL #4   Title Pt will sustain attention to therapy task for over 7 minutes with occasional min A   Time 1   Period Weeks   Status Not Met          SLP Long Term Goals - 10/17/15 1704      SLP LONG TERM GOAL #1   Title Pt will demonstrate audible phonation/intelligible speech with 2 word phrases with occasional min A 5/7 utterances   Time 5   Period Weeks   Status On-going     SLP LONG TERM GOAL #2   Title Pt will demonstrate audible cough/throat clear to command and to clear secretions with occasional min A 4/5 trials.    Time 5   Period Weeks   Status On-going     SLP LONG TERM GOAL #3   Title Pt will achieve timely oral phase and swallow with ice chip/ water trials 5/7 trials   Time 5   Period Weeks   Status On-going     SLP LONG TERM GOAL #4   Title Pt will sustain attention to therapy tasks for 12 minutes with occasiona min A   Time 5   Period Weeks   Status On-going          Plan - 10/17/15 1702    Clinical Impression Statement Pt with more verbalization today, suspect due as a direct result of being more alert during ST session. Continue skilled ST to maximize intellgibility, attention, and swallow .    Speech Therapy Frequency 3x / week   Duration --  5 weeks or 15 vistis   Treatment/Interventions Group dysphagia treatment;Pharyngeal strengthening exercises;Aspiration precaution training;Environmental controls;Compensatory techniques;Internal/external aids;Cognitive reorganization;Multimodal communcation approach;SLP instruction and feedback;Cueing hierarchy;Functional tasks;Patient/family education;Oral motor exercises   Potential to Achieve Goals Fair    Potential Considerations Ability to learn/carryover information;Co-morbidities;Severity of impairments      Patient will benefit from skilled therapeutic intervention in order to improve the following deficits and impairments:   Dysarthria and anarthria  Dysphagia, oropharyngeal phase  Cognitive communication deficit    Problem List Patient Active Problem List   Diagnosis Date Noted  . Tracheostomy status ([place]) 09/26/2015  . Allergic rhinitis 09/26/2015  . 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 Movement disorder 09/22/2015  . Encephalitis 09/22/2015  . Chest pain 02/07/2014  . Premature atrial contractions 01/13/2014  .  Abnormality of gait 12/04/2013  . Hyperlipidemia 12/21/2011  . Cardiovascular risk factor 12/21/2011  . Bunion 01/31/2008  . Metatarsalgia of both feet 09/20/2007  . FLAT FOOT 09/20/2007  . HALLUX RIGIDUS, ACQUIRED 09/20/2007    [facility] ,MS, CCC-SLP  10/17/2015, 5:04 PM  [facility] [address] [place], [place], [address] Phone: [phone]   Fax:  [phone]   Name: LAKEASHA PETION MRN: [id number] Date of Birth: 1950/05/21

## 2015-10-17 NOTE — Therapy (Signed)
[facility] [address] [place], [place], [address] Phone: [phone]   Fax:  [phone]  Physical Therapy Treatment  Patient Details  Name: Judy Spears MRN: [id number] Date of Birth: [birth year] Referring Provider: Alger Simons, MD Sharion Dove, MD)  Encounter Date: 10/17/2015      PT End of Session - 10/17/15 1645    Visit Number 11   Number of Visits 25   Date for PT Re-Evaluation 11/22/15   Authorization Type BCBS   PT Start Time 1404   PT Stop Time 1445   PT Time Calculation (min) 41 min   Activity Tolerance Patient tolerated treatment well   Behavior During Therapy [facility] for tasks assessed/performed      Past Medical History:  Diagnosis Date  . Arthritis    lt great toe  . Complication of anesthesia    pt has had headaches post op-did advise to hydra well preop  . Hair loss    right-sided  . History of exercise stress test    a. ETT (12/15) with 12:00 exercise, no ST changes, occasional PACs.   . Hyperlipidemia   . Insomnia   . Migraine headache   . 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 Postmenopausal   . Premature atrial contractions    a. Holter (12/15) with 8% PACs, no atrial runs or atrial fibrillation.     Past Surgical History:  Procedure Laterality Date  . BREAST BIOPSY  01/01/2011   Procedure: BREAST BIOPSY WITH NEEDLE LOCALIZATION;  Surgeon: Rolm Bookbinder, MD;  Location: [place];  Service: General;  Laterality: Left;  left breast wire localization biopsy  . cataracts right eye    . CESAREAN SECTION  1986  . HERNIA REPAIR  2000   RIH  . opirectinal membrane peel    . RHINOPLASTY  1983    There were no vitals filed for this visit.      Subjective Assessment - 10/17/15 1640    Subjective Brother, Wille Glaser, and sister-in-law, Vaughan Basta present for today's session.    Patient is accompained by: --  husband;, caregiver, [name]; brother and sister-in-law   Pertinent History Precautions: trach (capped), PEG, seizure. 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 No/denies                  NMR: - Squat pivot from w/c <> mat table (level) with mod A to L side, max A to R side.  - Bed mobility performed for increased active R shortening, for active cervical spine rotation to R, for RUE WB. Performed supine > R sidelying with mod cueing, increased time to transition from short sitting > R forearm propping; 50% of BLE management. Mod A required to transition from R sidelying > supine. Min A required for rolling to L. For R rolling, pt required mod A, tactile cueing for LUE movement across midline (although pt did initiate this without cueing from PT), verbal cueing for pt to visually track to R of midline and to perform cervical rotation to R. During transition from R sidelying > sit, pt able to self-manage BLE's with min cueing, increased time, but did require assist to transition from R  forearm propping > short sitting EOM.  - Blocked practice of sit <> stand from elevated mat table and static standing (for 30 seconds to 2 minutes each trial) with B HHA from PT (positioned anterior to pt), PT blocked anterior aspect of B tibias. During final 2 trials, sheet was effectively utilized at posterior aspect of B hips to promote increased hip flexion.                PT Short Term Goals - 10/17/15 1643      PT SHORT TERM GOAL #1   Title Pt will initiate anterior weight shift sufficient to unweight hips with supervision, 25% cueing in preparation for functional transfer.  (Target date: 10/21/15)   Status On-going     PT SHORT TERM GOAL #2   Title Pt will  perform seated scooting (AP/PA and M/L) with supervision, 25% cueing to increase independence with transfers, repositioning. (10/21/15)   Status On-going     PT SHORT TERM GOAL #3   Title Pt will tolerate static standing with total A of standing frame x10 minutes with vital signs WNL to improve standing tolerance and for tone management.  (10/21/15)   Status Achieved     PT SHORT TERM GOAL #4   Title Pt will perform dynamic sitting >/= 5 minutes with BLE's supported, intermittent UE support with CGA to indicate improved postural stability/control necessary for functional transfers. (10/21/15)   Status On-going     PT SHORT TERM GOAL #5   Title Pt will perform sit <> side lying with mod A, 50% cueing to progress toward increased independence with bed mobility.  (10/21/15)   Baseline 9/22: R sidelying > sit with mod A.    Status On-going     PT SHORT TERM GOAL #6   Title Pt will consistently perform sit <> stand with min A, 25% cueing to indicate increased independence with functional transfers.  (10/21/15)   Status On-going     PT SHORT TERM GOAL #7   Title Pt will perform level transfer from w/c <> mat table with mod A, 50% cueing to decrease caregiver burden with functional transfers.  (10/21/15)   Status On-going           PT Long Term Goals - 09/23/15 2120      PT LONG TERM GOAL #1   Title Pt will perform supine > sit with mod A, 50% cueing to indicate increased independence with getting out of bed.  (Target: 11/18/15)     PT LONG TERM GOAL #2   Title Pt will perform sit > supine with mod A, 50% cueing to indicate increased independence with getting into bed.  (11/18/15)     PT LONG TERM GOAL #3   Title Pt will perform level transfer from w/c <> mat table with min A, 25% cueing to decrease caregiver burden with functional transfers.  (11/18/15)     PT LONG TERM GOAL #4   Title Pt will perform dynamic standing >/= 2 consecutive minutes with BUE support and mod, 50% cueing to  indicate improved postural stanbility/control.   (11/18/15)     PT LONG TERM GOAL #5   Title Patient will ambulate x15' with max A of spouse using LRAD to enable pt/spouse to safely walk into/out of home bathroom.  (11/18/15)               Plan - 10/17/15 1641    Clinical Impression Statement Session focused on bed mobility, functional transfers, and  initiating sit <> stand using weighted RW. Pt aroused for >95% of this session. Pt performed R sidelying > sit with mod A during this session.    Rehab Potential Good   Clinical Impairments Affecting Rehab Potential Positive: strong family/social support; negative: chronicity of condition   PT Frequency 3x / week   PT Duration 8 weeks   PT Treatment/Interventions ADLs/Self Care Home Management;DME Instruction;Gait training;Stair training;Functional mobility training;Therapeutic activities;Patient/family education;Cognitive remediation;Neuromuscular re-education;Therapeutic exercise;Balance training;Orthotic Fit/Training;Wheelchair mobility training;Manual techniques;Splinting;Visual/perceptual remediation/compensation;Vestibular;Passive range of motion   PT Next Visit Plan Try automatic tasks (balloon tap). Seated scooting (forearm propping + reciprocal scooting; A/P scooting with B forearm support on raised surface). If skilled +2A available: quadruped, gait with AFO's, physioball for pelvic mobility/dissociation.  If no skilled +2A: standing frame, partial standing with BUE support, sit <> stand, seated scooting, supine bridging to scoot, seated reaching for anterior weight shift, or bed mobility.   Consulted and Agree with Plan of Care Patient;Other (Comment);Family member/caregiver   Family Member Consulted husband, [name] and caregiver, [name]      Patient will benefit from skilled therapeutic intervention in order to improve the following deficits and impairments:  Abnormal gait, Decreased balance, Decreased endurance, Decreased cognition,  Cardiopulmonary status limiting activity, Decreased activity tolerance, Decreased coordination, Decreased strength, Impaired flexibility, Impaired tone, Decreased mobility, Decreased skin integrity, Increased muscle spasms, Impaired vision/preception, Decreased range of motion, Impaired UE functional use, Postural dysfunction, Pain  Visit Diagnosis: Other abnormalities of gait and mobility  Other lack of coordination  Abnormal posture  Muscle weakness (generalized)     Problem List Patient Active Problem List   Diagnosis Date Noted  . Tracheostomy status ([place]) 09/26/2015  . Allergic rhinitis 09/26/2015  . 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 Movement disorder 09/22/2015  . Encephalitis 09/22/2015  . Chest pain 02/07/2014  . Premature atrial contractions 01/13/2014  . Abnormality of gait 12/04/2013  . Hyperlipidemia 12/21/2011  . Cardiovascular risk factor 12/21/2011  . Bunion 01/31/2008  . Metatarsalgia of both feet 09/20/2007  . FLAT FOOT 09/20/2007  . HALLUX RIGIDUS, ACQUIRED 09/20/2007    Billie Ruddy, PT, DPT [facility] [address] [place], [place], [address] Phone: [phone]   Fax:  [phone] 10/17/15, 4:46 PM  Name: Judy Spears MRN: [id number] Date of Birth: 25-Oct-1950

## 2015-10-17 NOTE — Therapy (Signed)
[facility] [address] [place], [place], [address] Phone: [phone]   Fax:  [phone]  Occupational Therapy Treatment  Patient Details  Name: Judy Spears MRN: [id number] Date of Birth: 1950/11/16 Referring Provider: Dr. Sharion Dove  Encounter Date: 10/17/2015      OT End of Session - 10/17/15 1727    Visit Number 10   Number of Visits 24   Date for OT Re-Evaluation 11/18/15   Authorization Type BCBS unlimited visits   OT Start Time 1315   OT Stop Time 1400   OT Time Calculation (min) 45 min   Activity Tolerance Patient tolerated treatment well   Behavior During Therapy [facility] for tasks assessed/performed      Past Medical History:  Diagnosis Date  . Arthritis    lt great toe  . Complication of anesthesia    pt has had headaches post op-did advise to hydra well preop  . Hair loss    right-sided  . History of exercise stress test    a. ETT (12/15) with 12:00 exercise, no ST changes, occasional PACs.   . Hyperlipidemia   . Insomnia   . Migraine headache   . 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 Postmenopausal   . Premature atrial contractions    a. Holter (12/15) with 8% PACs, no atrial runs or atrial fibrillation.     Past Surgical History:  Procedure Laterality Date  . BREAST BIOPSY  01/01/2011   Procedure: BREAST BIOPSY WITH NEEDLE LOCALIZATION;  Surgeon: Rolm Bookbinder, MD;  Location: [facility];  Service: General;  Laterality: Left;  left breast wire localization biopsy  . cataracts right eye    . CESAREAN SECTION  1986  . HERNIA REPAIR  2000   RIH  . opirectinal membrane peel    . RHINOPLASTY  1983    There were no vitals filed for this visit.      Subjective Assessment - 10/17/15 1646    Subjective  "[name]"  indicating name of older brother who was visitng in therapy today   Patient is accompained by: Family member   Pertinent History see epic snapshot -  ABI/hypoxia from [place]   Patient Stated Goals Pt unable to state.    Currently in Pain? No/denies   Multiple Pain Sites No                      OT Treatments/Exercises (OP) - 10/17/15 0001      ADLs   Toileting Caregiver indicates that they have moved commode into office, as we discussed yesterday, to offer better accessibility for toileting at home.  Safer transfers with caregiver for toileting.      Neurological Re-education Exercises   Other Exercises 1 Working on components of level surface transfer.  Flexing forward at waist to come forward in chair to weight feet on ground with decreased reliance on UE's, and improved midline control.  Patient initially pushes back into extension in attempts to move forward, but with guidance able to replicate froward weight shift from about midway through arc of motion.  Patient consistently has decreased motor restlessness in legs when firmly, and intentionally planted on ground.  Patient able to sustain upright sitting posture now for brief periods without assistance, and is often voicing audibly at one and two word level.                  OT  Education - 10/17/15 1726    Education provided Yes   Education Details Discussed with husband and caregiver the benefit of seated weight shifts from edge of bed at home - with built up platform under feet.     Person(s) Educated Patient;Spouse;Caregiver(s)   Methods Explanation;Demonstration   Comprehension Verbalized understanding          OT Short Term Goals - 10/17/15 1729      OT SHORT TERM GOAL #1   Title Pt and husband will be mod I with HEP - 10/21/2015   Status On-going     OT SHORT TERM GOAL #2   Title Pt will demonstrate adequate postural control for breath support to voice at least 3 words   Status Achieved     OT SHORT TERM GOAL #3   Title Pt will transfer to commode with moderate assistance 75% of the time.   Status On-going     OT SHORT TERM GOAL #4    Title Pt will tolerate positional changes as evidenced by respiration rate and motor quieting in preparation for funcitonal mobility   Status Achieved           OT Long Term Goals - 10/17/15 1729      OT LONG TERM GOAL #1   Title Pt and husband will be mod I with upgraded HEP - 11/18/2015   Status On-going     OT LONG TERM GOAL #2   Title Pt will demonstrate adequate postural control to support 5 words in supported sitting.   Status On-going     OT LONG TERM GOAL #3   Title Pt will demonstrate ability for low reach with RUE for functional task with  mod facilitation   Status On-going     OT LONG TERM GOAL #4   Title Pt will wash face with LUE with min a in supported sitting   Status On-going     OT LONG TERM GOAL #5   Title Pt will release objects with L hand with min faciltation    Status On-going     OT LONG TERM GOAL #6   Title Pt will stand with min a while caregiver manages pants during LB dressing 75% of the time.   Status On-going     OT LONG TERM GOAL #7   Title Pt will transfer to commode with min a 75% of the time.   Status On-going     OT LONG TERM GOAL #8   Title Pt will be able to sit unsupported with supervision 50% of the time while engaged in functional task.   Status On-going               Plan - 10/17/15 1728    Clinical Impression Statement Patient with improved midline orientation in sitting, and improved ability to weight shift forward in preparation for transfer   Rehab Potential Fair   Clinical Impairments Affecting Rehab Potential severity of deficits, slow rate of recovery   OT Frequency 3x / week   OT Duration 8 weeks   OT Treatment/Interventions Self-care/ADL training;Ultrasound;Moist Heat;Electrical Stimulation;DME and/or AE instruction;Neuromuscular education;Therapeutic exercise;Functional Mobility Training;Manual Therapy;Passive range of motion;Splinting;Therapeutic activities;Balance training;Patient/family  education;Visual/perceptual remediation/compensation;Cognitive remediation/compensation   Plan thoracic / hip extension, level surface transfers - squat method   OT Home Exercise Plan Discussed with patient, with husband present, the importance of flexing forward and using legs for transfers and standing   Consulted and Agree with Plan of Care Patient;Family member/caregiver   Family Member  Consulted caregiver [name], husband [name]      Patient will benefit from skilled therapeutic intervention in order to improve the following deficits and impairments:  Decreased endurance, Decreased skin integrity, Impaired vision/preception, Improper body mechanics, Impaired perceived functional ability, Decreased knowledge of precautions, Cardiopulmonary status limiting activity, Decreased activity tolerance, Decreased knowledge of use of DME, Decreased strength, Impaired flexibility, Improper spinal/pelvic alignment, Impaired sensation, Difficulty walking, Decreased mobility, Decreased balance, Decreased cognition, Decreased range of motion, Impaired tone, Pain, Impaired UE functional use, Decreased safety awareness, Decreased coordination  Visit Diagnosis: Other lack of coordination  Abnormal posture  Muscle weakness (generalized)  Unsteadiness on feet  Stiffness of right shoulder, not elsewhere classified  Stiffness of left shoulder, not elsewhere classified  Frontal lobe and executive function deficit  Stiffness of left elbow, not elsewhere classified    Problem List Patient Active Problem List   Diagnosis Date Noted  . Tracheostomy status ([place]) 09/26/2015  . Allergic rhinitis 09/26/2015  . 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 Movement disorder 09/22/2015  . Encephalitis 09/22/2015  . Chest pain 02/07/2014  . Premature atrial contractions 01/13/2014  . Abnormality of gait 12/04/2013  . Hyperlipidemia 12/21/2011  . Cardiovascular risk factor 12/21/2011  . Bunion 01/31/2008  . Metatarsalgia  of both feet 09/20/2007  . FLAT FOOT 09/20/2007  . HALLUX RIGIDUS, ACQUIRED 09/20/2007    Mariah Milling, OTR/L 10/17/2015, 5:31 PM  [facility] [address] [place], [place], [address] Phone: [phone]   Fax:  [phone]  Name: Judy Spears MRN: [id number] Date of Birth: January 11, 1951

## 2015-10-20 ENCOUNTER — Encounter: Payer: Self-pay | Admitting: Occupational Therapy

## 2015-10-20 ENCOUNTER — Ambulatory Visit: Payer: BC Managed Care – PPO | Admitting: *Deleted

## 2015-10-20 ENCOUNTER — Encounter
Payer: BC Managed Care – PPO | Attending: Physical Medicine & Rehabilitation | Admitting: Physical Medicine & Rehabilitation

## 2015-10-20 ENCOUNTER — Ambulatory Visit: Payer: BC Managed Care – PPO | Admitting: Occupational Therapy

## 2015-10-20 ENCOUNTER — Ambulatory Visit: Payer: BC Managed Care – PPO | Admitting: Physical Therapy

## 2015-10-20 ENCOUNTER — Encounter: Payer: Self-pay | Admitting: Physical Medicine & Rehabilitation

## 2015-10-20 VITALS — BP 165/94 | HR 79

## 2015-10-20 DIAGNOSIS — M25611 Stiffness of right shoulder, not elsewhere classified: Secondary | ICD-10-CM

## 2015-10-20 DIAGNOSIS — R1310 Dysphagia, unspecified: Secondary | ICD-10-CM | POA: Insufficient documentation

## 2015-10-20 DIAGNOSIS — R41841 Cognitive communication deficit: Secondary | ICD-10-CM

## 2015-10-20 DIAGNOSIS — R293 Abnormal posture: Secondary | ICD-10-CM

## 2015-10-20 DIAGNOSIS — J9690 Respiratory failure, unspecified, unspecified whether with hypoxia or hypercapnia: Secondary | ICD-10-CM | POA: Diagnosis not present

## 2015-10-20 DIAGNOSIS — M25612 Stiffness of left shoulder, not elsewhere classified: Secondary | ICD-10-CM

## 2015-10-20 DIAGNOSIS — R29810 Facial weakness: Secondary | ICD-10-CM | POA: Insufficient documentation

## 2015-10-20 DIAGNOSIS — G8250 Quadriplegia, unspecified: Secondary | ICD-10-CM | POA: Insufficient documentation

## 2015-10-20 DIAGNOSIS — G0490 Encephalitis and encephalomyelitis, unspecified: Secondary | ICD-10-CM | POA: Diagnosis not present

## 2015-10-20 DIAGNOSIS — R1312 Dysphagia, oropharyngeal phase: Secondary | ICD-10-CM

## 2015-10-20 DIAGNOSIS — M25622 Stiffness of left elbow, not elsewhere classified: Secondary | ICD-10-CM

## 2015-10-20 DIAGNOSIS — R269 Unspecified abnormalities of gait and mobility: Secondary | ICD-10-CM | POA: Diagnosis not present

## 2015-10-20 DIAGNOSIS — A858 Other specified viral encephalitis: Secondary | ICD-10-CM | POA: Insufficient documentation

## 2015-10-20 DIAGNOSIS — M6281 Muscle weakness (generalized): Secondary | ICD-10-CM

## 2015-10-20 DIAGNOSIS — R278 Other lack of coordination: Secondary | ICD-10-CM

## 2015-10-20 DIAGNOSIS — R2681 Unsteadiness on feet: Secondary | ICD-10-CM

## 2015-10-20 DIAGNOSIS — R471 Dysarthria and anarthria: Secondary | ICD-10-CM

## 2015-10-20 DIAGNOSIS — R41844 Frontal lobe and executive function deficit: Secondary | ICD-10-CM

## 2015-10-20 DIAGNOSIS — R2689 Other abnormalities of gait and mobility: Secondary | ICD-10-CM

## 2015-10-20 MED ORDER — CLONAZEPAM 0.5 MG PO TABS: 0.5000 mg | ORAL_TABLET | ORAL | 3 refills | Status: DC

## 2015-10-20 MED ORDER — METHYLPHENIDATE HCL 5 MG PO TABS: 5.0000 mg | ORAL_TABLET | Freq: Two times a day (BID) | ORAL | 0 refills | Status: DC

## 2015-10-20 NOTE — Therapy (Signed)
[facility] [address] [place], [place], [address] Phone: [phone]   Fax:  [phone]  Physical Therapy Treatment  Patient Details  Name: Judy Spears MRN: [id number] Date of Birth: 1950-03-25 Referring Provider: Alger Simons, MD Sharion Dove, MD)  Encounter Date: 10/20/2015      PT End of Session - 10/20/15 1052    Visit Number 12   Number of Visits 25   Date for PT Re-Evaluation 11/22/15   Authorization Type BCBS   PT Start Time [phone]   PT Stop Time 0930   PT Time Calculation (min) 41 min   Equipment Utilized During Treatment Other (comment)  BWSS   Activity Tolerance Patient tolerated treatment well   Behavior During Therapy [facility] for tasks assessed/performed      Past Medical History:  Diagnosis Date  . Arthritis    lt great toe  . Complication of anesthesia    pt has had headaches post op-did advise to hydra well preop  . Hair loss    right-sided  . History of exercise stress test    a. ETT (12/15) with 12:00 exercise, no ST changes, occasional PACs.   . Hyperlipidemia   . Insomnia   . Migraine headache   . 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 Postmenopausal   . Premature atrial contractions    a. Holter (12/15) with 8% PACs, no atrial runs or atrial fibrillation.     Past Surgical History:  Procedure Laterality Date  . BREAST BIOPSY  01/01/2011   Procedure: BREAST BIOPSY WITH NEEDLE LOCALIZATION;  Surgeon: Rolm Bookbinder, MD;  Location: [place];  Service: General;  Laterality: Left;  left breast wire localization biopsy  . cataracts right eye    . CESAREAN SECTION  1986  . HERNIA REPAIR  2000   RIH  . opirectinal membrane peel    . RHINOPLASTY  1983    There were no vitals filed for this visit.      Subjective Assessment - 10/20/15 1047    Subjective Pt arrived accompanied by husband and caregiver. No significant changes.   Patient is accompained by:  --  husband, [name]   Pertinent History Precautions: trach (capped), PEG, seizure. 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 No/denies          NMR: - Attempted to utilize BWSS for standing with improved postural alignment (increased B hip/knee extension). Attempted to stand several times using BWSS; however, standing limited by harness slipping upward, causing increased pt discomfort at chin/neck and in axillary regional (bilaterally). Also note that passive nature of BWSS does not promote LE muscle activation, as pt tends to push against resistance (suspect this is for pt to increase proprioceptive input/kinesthetic awareness). - Blocked practice of sit <> stand from w/c with belt (stabilized by // bars) anterior to B tibias, just distal to knee and BUE support at // bars. For sit > stand transfer, pt progressed from requiring min A to close (S), verbal/tactile cueing for midline orientation (due to lateral trunk lean to L side) and for full B hip extension. Able to perform static standing with BUE support at // bars with (S) to min  guard for up to 3 minutes. Intermittently able to perform static standing with single UE support at // bars with min guard.                         PT Short Term Goals - 10/17/15 1643      PT SHORT TERM GOAL #1   Title Pt will initiate anterior weight shift sufficient to unweight hips with supervision, 25% cueing in preparation for functional transfer.  (Target date: 10/21/15)   Status On-going     PT SHORT TERM GOAL #2   Title Pt will perform seated scooting (AP/PA and M/L) with supervision, 25% cueing to increase independence with transfers, repositioning.  (10/21/15)   Status On-going     PT SHORT TERM GOAL #3   Title Pt will tolerate static standing with total A of standing frame x10 minutes with vital signs WNL to improve standing tolerance and for tone management.  (10/21/15)   Status Achieved     PT SHORT TERM GOAL #4   Title Pt will perform dynamic sitting >/= 5 minutes with BLE's supported, intermittent UE support with CGA to indicate improved postural stability/control necessary for functional transfers. (10/21/15)   Status On-going     PT SHORT TERM GOAL #5   Title Pt will perform sit <> side lying with mod A, 50% cueing to progress toward increased independence with bed mobility.  (10/21/15)   Baseline 9/22: R sidelying > sit with mod A.    Status On-going     PT SHORT TERM GOAL #6   Title Pt will consistently perform sit <> stand with min A, 25% cueing to indicate increased independence with functional transfers.  (10/21/15)   Status On-going     PT SHORT TERM GOAL #7   Title Pt will perform level transfer from w/c <> mat table with mod A, 50% cueing to decrease caregiver burden with functional transfers.  (10/21/15)   Status On-going           PT Long Term Goals - 09/23/15 2120      PT LONG TERM GOAL #1   Title Pt will perform supine > sit with mod A, 50% cueing to indicate increased independence with getting out of bed.  (Target: 11/18/15)     PT LONG TERM GOAL #2   Title Pt will perform sit > supine with mod A, 50% cueing to indicate increased independence with getting into bed.  (11/18/15)     PT LONG TERM GOAL #3   Title Pt will perform level transfer from w/c <> mat table with min A, 25% cueing to decrease caregiver burden with functional transfers.  (11/18/15)     PT LONG TERM GOAL #4   Title Pt will perform dynamic standing >/= 2 consecutive minutes with BUE support and mod, 50% cueing to indicate improved postural stanbility/control.   (11/18/15)     PT LONG TERM GOAL #5   Title Patient will ambulate x15' with  max A of spouse using LRAD to enable pt/spouse to safely walk into/out of home bathroom.  (11/18/15)               Plan - 10/20/15 1053    Clinical Impression Statement Attempted to utilize body weight support system (BWSS) to promote more upright posture during standing. Use of BWSS limited by harness slipping upward, causing pt discomfort at axilla (bilat) and at pt's chin. Also note that BWSS was likely too passive  of an intervention. Remainder of session addressed sit <> stand at // bars. Pt able to perform sit > stand with from (S) to min guard when belt anterior to B tibias (to block/stabilize) with BUE's at // bars.    Rehab Potential Good   Clinical Impairments Affecting Rehab Potential Positive: strong family/social support; negative: chronicity of condition   PT Frequency 3x / week   PT Treatment/Interventions ADLs/Self Care Home Management;DME Instruction;Gait training;Stair training;Functional mobility training;Therapeutic activities;Patient/family education;Cognitive remediation;Neuromuscular re-education;Therapeutic exercise;Balance training;Orthotic Fit/Training;Wheelchair mobility training;Manual techniques;Splinting;Visual/perceptual remediation/compensation;Vestibular;Passive range of motion   PT Next Visit Plan Check STG's. Sit <> stand at // bars. Seated scooting (forearm propping + reciprocal scooting; A/P scooting with B forearm support on raised surface). If skilled +2A available: quadruped, gait with AFO's, physioball for pelvic mobility/dissociation.  If no skilled +2A: standing frame, partial standing with BUE support, sit <> stand, seated scooting, supine bridging to scoot, seated reaching for anterior weight shift, or bed mobility.   Consulted and Agree with Plan of Care Patient;Family member/caregiver   Family Member Consulted husband, [name] and caregiver, [name]      Patient will benefit from skilled therapeutic intervention in order to improve the following deficits  and impairments:  Abnormal gait, Decreased balance, Decreased endurance, Decreased cognition, Cardiopulmonary status limiting activity, Decreased activity tolerance, Decreased coordination, Decreased strength, Impaired flexibility, Impaired tone, Decreased mobility, Decreased skin integrity, Increased muscle spasms, Impaired vision/preception, Decreased range of motion, Impaired UE functional use, Postural dysfunction, Pain  Visit Diagnosis: Other abnormalities of gait and mobility  Other lack of coordination  Abnormal posture  Muscle weakness (generalized)     Problem List Patient Active Problem List   Diagnosis Date Noted  . Tracheostomy status ([place]) 09/26/2015  . Allergic rhinitis 09/26/2015  . 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 Movement disorder 09/22/2015  . Encephalitis 09/22/2015  . Chest pain 02/07/2014  . Premature atrial contractions 01/13/2014  . Abnormality of gait 12/04/2013  . Hyperlipidemia 12/21/2011  . Cardiovascular risk factor 12/21/2011  . Bunion 01/31/2008  . Metatarsalgia of both feet 09/20/2007  . FLAT FOOT 09/20/2007  . HALLUX RIGIDUS, ACQUIRED 09/20/2007    Billie Ruddy, PT, DPT [facility] [address] [place], [place], [address] Phone: [phone]   Fax:  [phone] 10/20/15, 12:03 PM  Name: Judy Spears MRN: [id number] Date of Birth: 1950-09-17

## 2015-10-20 NOTE — Progress Notes (Signed)
Subjective:    Patient ID: Judy Spears, female    DOB: 03/25/1950, 65 y.o.   MRN: [id number]  HPI Judy Spears is here in follow up of her encephalitis and tetraplegia. She's been involved with outpt therapies at [facility] and showing progress. They have been attempting gait with her. I witnessed a video and noticed a lot of hip adductor spasm and hamstring activity.   She has been generally more awake but sill falls asleep quite often during the day. The AM klonopin is given at 0400 and the afternoon klonopin is given at 1400. Judy Spears thinks it may help with her movement disorder.   She has been to see pulmonary. Her trach is still in but capped. Her voice is getting stronger. She has had no problems with secretions.    Review of Systems  Constitutional: Negative.   HENT: Negative.   Eyes: Negative.   Respiratory: Negative.   Cardiovascular: Negative.   Gastrointestinal: Negative.   Endocrine: Negative.   Genitourinary: Positive for flank pain.  Skin: Negative.   Allergic/Immunologic: Negative.   Neurological: Negative.   Hematological: Negative.   Psychiatric/Behavioral: Negative.        Objective:   Physical Exam  General: Alert. Makes good eye contact.  No apparent distress HEENT: Head is normocephalic, atraumatic, PERRLA, EOMI, sclera anicteric, oral mucosa pink and moist, dentition intact, ext ear canals clear,  Neck: Supple without JVD or lymphadenopathy. #4 trach with button. Heart: Reg rate and rhythm. No murmurs rubs or gallops Chest: CTA bilaterally without wheezes, rales, or rhonchi; no distress. No secretions.  Abdomen: Soft, non-tender, non-distended, bowel sounds positive. PEG intact Extremities: No clubbing, cyanosis, or edema. Pulses are 2+ Skin: Clean and intact without signs of breakdown Neuro: Pt is more alert but still falls asleep at times.. Improved phonation and cough.  Sensation--appears intact to basic pain stimulation in all 4/s. DTR's are 1+ to  2+---she displays increased tone in the bilateral hamstrings and hip adductors which worsens and dissipates sporadically. When asked to voluntarily engage muscle, her tone and movement does improve.. Motor: RUE: deltoid 1/5, bicep,tricep, wrist, hand 3 to 3+/5. LUE grossly 3 to 4/5---inconsistent with effort. LE: 3 to 4- prox to distal but inconsistent once again. Seems to initiate better with left side. There was no resting tone on examination. Writhing/perpetual movements of lower extemities, left more than right were noted today. Both legs occasionally went into extensor pattern today. Musculoskeletal: left SCM tight, can be passively ranged to neutral neck position. Left elbow with 40 degree flexion contracture, can abduct left shoulder to 80 degrees before meeting resistance--nearly 90 on right, no sublux.  Psych: Pt's affect is appropriate. Pt is cooperative        Assessment & Plan:  1. 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 Hx of respiratory failure due to the above with chronic trach/tracheal stenosis 3. Severe dysphagia due to the above 4. 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 Pt did display resting lower extremity tone today in hamstrings and hip adductors as part of her movement disorder.   Plan: 1. Continue Klonopin--but due to am/afternoon fatigue, reduce/change to: 0.25mg  at 0400, 0.25mg  at 1400, and 0.5mg  at 2000 2. Consider propranolol trial 3. gabapentin--dc when bottle runs out. 4. Follow up with trach clinic for stenosis. Lurline Idol out soon? 5. Botox to bilateral hamstrings and hip adductors, 300 units. Continue PT/OT 6. Water flushes--  day time only to decrease night time urination 7. Change to  ritalin 5mg  at 0800 and 1200 to see if this better helps her day/night normalization and day time arousal.. Hold amantadine for now.  8. Extensive time was spent in counseling and discussion during this encounter, totaling about 30  minutes. I will follow up with her in about a month.

## 2015-10-20 NOTE — Therapy (Signed)
[name] [address] [place], [place], [address] Phone: [phone]   Fax:  [phone]  Occupational Therapy Treatment  Patient Details  Name: Judy Spears MRN: [id number] Date of Birth: 1950-01-30 Referring Provider: Dr. Sharion Dove  Encounter Date: 10/20/2015      OT End of Session - 10/20/15 1203    Visit Number 11   Number of Visits 24   Date for OT Re-Evaluation 11/18/15   Authorization Type BCBS unlimited visits   OT Start Time 1103   OT Stop Time 1146   OT Time Calculation (min) 43 min   Activity Tolerance Patient tolerated treatment well   Behavior During Therapy [facility] for tasks assessed/performed      Past Medical History:  Diagnosis Date  . Arthritis    lt great toe  . Complication of anesthesia    pt has had headaches post op-did advise to hydra well preop  . Hair loss    right-sided  . History of exercise stress test    a. ETT (12/15) with 12:00 exercise, no ST changes, occasional PACs.   . Hyperlipidemia   . Insomnia   . Migraine headache   . 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 Postmenopausal   . Premature atrial contractions    a. Holter (12/15) with 8% PACs, no atrial runs or atrial fibrillation.     Past Surgical History:  Procedure Laterality Date  . BREAST BIOPSY  01/01/2011   Procedure: BREAST BIOPSY WITH NEEDLE LOCALIZATION;  Surgeon: Rolm Bookbinder, MD;  Location: [facility];  Service: General;  Laterality: Left;  left breast wire localization biopsy  . cataracts right eye    . CESAREAN SECTION  1986  . HERNIA REPAIR  2000   RIH  . opirectinal membrane peel    . RHINOPLASTY  1983    There were no vitals filed for this visit.      Subjective Assessment - 10/20/15 1155    Subjective  Good   Patient is accompained by: Family member  and [place]   Patient Stated Goals Pt unable to state.    Currently in Pain? No/denies   Multiple  Pain Sites No                      OT Treatments/Exercises (OP) - 10/20/15 0001      ADLs   LB Dressing Worked on dynamic sitting balance to address donning shoes.  Assisted patient to cross leg - wide and then use hand to assist heel of shoe over heel.  Patient with improved sitting balance, and improved ability to use vision to guide hand function in this task.     Toileting Worked on componenets of level surface transfer from mat table to tub bench with back - with emphasis on forward weight shift of trunk with feet on floor.  Patient spontaneously pulls self forward, but with cueing can shift shoulders forward to unweight seat.  In this position she can often use LE's to assist with transfer.  Patient is showing improved initiaition of trunk shortening and lengtheneing to aide with reciprocal scooting while seated.   Toilet transfers at home are still challenging due in part to height of specialty wheelchair.  Continue to address.     Neurological Re-education Exercises   Other Exercises 1 Neuromuscular reeducation to help activate both hip and thoracic extension through shorter body length.  Patient able to  actively extend hips better today, although needing mod assist to achieve thoracic extension - and difficult to sustain thoracic extension.  Worked on isolating one leg at a time to walk on knees backward to furthen increase demand on LE's and trunk.  Transitioned from kneeling to half kneeling to increase active use of left leg out of adduction / internal rotation pattern.                  OT Education - 10/20/15 1203    Education provided Yes   Education Details components of level surface transfer   Person(s) Educated Patient;Spouse;Caregiver(s)   Methods Explanation;Demonstration;Tactile cues;Verbal cues   Comprehension Verbalized understanding;Need further instruction;Tactile cues required;Verbal cues required          OT Short Term Goals - 10/17/15 1729       OT SHORT TERM GOAL #1   Title Pt and husband will be mod I with HEP - 10/21/2015   Status On-going     OT SHORT TERM GOAL #2   Title Pt will demonstrate adequate postural control for breath support to voice at least 3 words   Status Achieved     OT SHORT TERM GOAL #3   Title Pt will transfer to commode with moderate assistance 75% of the time.   Status On-going     OT SHORT TERM GOAL #4   Title Pt will tolerate positional changes as evidenced by respiration rate and motor quieting in preparation for funcitonal mobility   Status Achieved           OT Long Term Goals - 10/17/15 1729      OT LONG TERM GOAL #1   Title Pt and husband will be mod I with upgraded HEP - 11/18/2015   Status On-going     OT LONG TERM GOAL #2   Title Pt will demonstrate adequate postural control to support 5 words in supported sitting.   Status On-going     OT LONG TERM GOAL #3   Title Pt will demonstrate ability for low reach with RUE for functional task with  mod facilitation   Status On-going     OT LONG TERM GOAL #4   Title Pt will wash face with LUE with min a in supported sitting   Status On-going     OT LONG TERM GOAL #5   Title Pt will release objects with L hand with min faciltation    Status On-going     OT LONG TERM GOAL #6   Title Pt will stand with min a while caregiver manages pants during LB dressing 75% of the time.   Status On-going     OT LONG TERM GOAL #7   Title Pt will transfer to commode with min a 75% of the time.   Status On-going     OT LONG TERM GOAL #8   Title Pt will be able to sit unsupported with supervision 50% of the time while engaged in functional task.   Status On-going               Plan - 10/20/15 1204    Clinical Impression Statement Patient feeling better after UTI last week.  Patient better able to actively extend hips, as needed for ADL stand and sit to stand transitions.     Rehab Potential Fair   Clinical Impairments Affecting  Rehab Potential severity of deficits, slow rate of recovery   OT Frequency 3x / week   OT Duration 8  weeks   OT Treatment/Interventions Self-care/ADL training;Ultrasound;Moist Heat;Electrical Stimulation;DME and/or AE instruction;Neuromuscular education;Therapeutic exercise;Functional Mobility Training;Manual Therapy;Passive range of motion;Splinting;Therapeutic activities;Balance training;Patient/family education;Visual/perceptual remediation/compensation;Cognitive remediation/compensation   Plan thoracic / hip extension, level surface transfers, sit to stand, functional use of hands- ADL   OT Home Exercise Plan Discussed with patient, with husband present, the importance of flexing forward and using legs for transfers and standing   Consulted and Agree with Plan of Care Patient;Family member/caregiver   Family Member Consulted caregiver [name], husband [name]      Patient will benefit from skilled therapeutic intervention in order to improve the following deficits and impairments:  Decreased endurance, Decreased skin integrity, Impaired vision/preception, Improper body mechanics, Impaired perceived functional ability, Decreased knowledge of precautions, Cardiopulmonary status limiting activity, Decreased activity tolerance, Decreased knowledge of use of DME, Decreased strength, Impaired flexibility, Improper spinal/pelvic alignment, Impaired sensation, Difficulty walking, Decreased mobility, Decreased balance, Decreased cognition, Decreased range of motion, Impaired tone, Pain, Impaired UE functional use, Decreased safety awareness, Decreased coordination  Visit Diagnosis: Other lack of coordination  Abnormal posture  Muscle weakness (generalized)  Unsteadiness on feet  Stiffness of right shoulder, not elsewhere classified  Stiffness of left shoulder, not elsewhere classified  Frontal lobe and executive function deficit  Stiffness of left elbow, not elsewhere classified    Problem  List Patient Active Problem List   Diagnosis Date Noted  . Tracheostomy status ([place]) 09/26/2015  . Allergic rhinitis 09/26/2015  . 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 Movement disorder 09/22/2015  . Encephalitis 09/22/2015  . Chest pain 02/07/2014  . Premature atrial contractions 01/13/2014  . Abnormality of gait 12/04/2013  . Hyperlipidemia 12/21/2011  . Cardiovascular risk factor 12/21/2011  . Bunion 01/31/2008  . Metatarsalgia of both feet 09/20/2007  . FLAT FOOT 09/20/2007  . HALLUX RIGIDUS, ACQUIRED 09/20/2007    Mariah Milling, OTR/L 10/20/2015, 12:09 PM  [facility] [address] [place], [place], [address] Phone: [phone]   Fax:  [phone]  Name: LAZETTA PETITE MRN: [id number] Date of Birth: 01-25-1951

## 2015-10-20 NOTE — Telephone Encounter (Signed)
Dr Aundra Dubin the last lipid profile from March 2016 indicate atorvastatin will be changing to crestor.  That is the last lipid profile on file here for her.   They are requesting a refill for atorvastatin 20 mg daily.

## 2015-10-20 NOTE — Therapy (Signed)
[facility] [address] [place], [place], [address] Phone: [phone]   Fax:  [phone]  Speech Language Pathology Treatment  Patient Details  Name: Judy Spears MRN: [id number] Date of Birth: 1951-01-25 Referring Provider: Dr. Alger Simons  Encounter Date: 10/20/2015      End of Session - 10/20/15 1023    Visit Number 12   Number of Visits 24   Date for SLP Re-Evaluation 11/18/15   SLP Start Time 0934   SLP Stop Time  1018   SLP Time Calculation (min) 44 min   Activity Tolerance Patient tolerated treatment well;Patient limited by lethargy      Past Medical History:  Diagnosis Date  . Arthritis    lt great toe  . Complication of anesthesia    pt has had headaches post op-did advise to hydra well preop  . Hair loss    right-sided  . History of exercise stress test    a. ETT (12/15) with 12:00 exercise, no ST changes, occasional PACs.   . Hyperlipidemia   . Insomnia   . Migraine headache   . 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 Postmenopausal   . Premature atrial contractions    a. Holter (12/15) with 8% PACs, no atrial runs or atrial fibrillation.     Past Surgical History:  Procedure Laterality Date  . BREAST BIOPSY  01/01/2011   Procedure: BREAST BIOPSY WITH NEEDLE LOCALIZATION;  Surgeon: Rolm Bookbinder, MD;  Location: [facility];  Service: General;  Laterality: Left;  left breast wire localization biopsy  . cataracts right eye    . CESAREAN SECTION  1986  . HERNIA REPAIR  2000   RIH  . opirectinal membrane peel    . RHINOPLASTY  1983    There were no vitals filed for this visit.      Subjective Assessment - 10/20/15 1020    Subjective Pt hardworking and pleasant.   Patient is accompained by: Family member   Currently in Pain? No/denies               ADULT SLP TREATMENT - 10/20/15 0001      General Information   Behavior/Cognition  Cooperative;Pleasant mood;Lethargic   Patient Positioning Upright in chair     Treatment Provided   Treatment provided Dysphagia;Cognitive-Linquistic     Dysphagia Treatment   Temperature Spikes Noted No   Respiratory Status Room air   Oral Cavity - Dentition Adequate natural dentition   Treatment Methods Skilled observation;Compensation strategy training;Patient/caregiver education;Upgraded PO texture trial   Patient observed directly with PO's Yes   Type of PO's observed Ice chips;Thin liquids   Feeding Total assist   Liquids provided via Teaspoon   Oral Phase Signs & Symptoms Anterior loss/spillage;Oral holding   Pharyngeal Phase Signs & Symptoms Suspected delayed swallow initiation   Type of cueing Verbal;Tactile;Visual   Amount of cueing Moderate     Cognitive-Linquistic Treatment   Treatment focused on Dysarthria;Voice;Aphasia;Apraxia   Skilled Treatment see clinical impression     Assessment / Recommendations / Plan   Plan Continue with current plan of care     Progression Toward Goals   Progression toward goals Progressing toward goals            SLP Short Term Goals - 10/20/15 1030      SLP SHORT TERM GOAL #1   Title Pt will demonstrate audible phonation with prolonged vowels and cv syllables 12/15 trials  with occasional min A   Time 1   Period Weeks   Status On-going     SLP SHORT TERM GOAL #2   Title Pt will demonstrate breath support prior to repetition of words 8/10x   Time 1   Period Weeks   Status Not Met     SLP SHORT TERM GOAL #3   Title Pt will swallow ice chip/H20 via spoon in less than 7 seconds with usual min to mod A 3/5 trials   Baseline 9/25,    Time 1   Period Weeks   Status On-going     SLP SHORT TERM GOAL #4   Title Pt will sustain attention to therapy task for over 7 minutes with occasional min A   Time 1   Period Weeks   Status Not Met          SLP Long Term Goals - 10/20/15 1031      SLP LONG TERM GOAL #1   Title Pt  will demonstrate audible phonation/intelligible speech with 2 word phrases with occasional min A 5/7 utterances   Time 5   Period Weeks   Status On-going     SLP LONG TERM GOAL #2   Title Pt will demonstrate audible cough/throat clear to command and to clear secretions with occasional min A 4/5 trials.    Time 5   Period Weeks   Status On-going     SLP LONG TERM GOAL #3   Title Pt will achieve timely oral phase and swallow with ice chip/ water trials 5/7 trials   Time 5   Period Weeks   Status On-going     SLP LONG TERM GOAL #4   Title Pt will sustain attention to therapy tasks for 12 minutes with occasiona min A   Time 5   Period Weeks   Status On-going          Plan - 10/20/15 1024    Clinical Impression Statement Pt seen for dysphagia and speech therapy. Pt consistently triggered pharyngeal response between 1 and 8 seconds of PO presentation and appeared to benefit from cued double swallow with count down, "1, 2, 3, swallow!" No overt s/s aspiration noted. Will trial puree-type consistency in future sessions. Pt's spouse indicated it has been 4 months since previous MBSS. CV and open vowel verbalizations consistent, but functional verbal responses limited with vocal intensity (poor intelligibility despite max A).   Speech Therapy Frequency 3x / week   Duration --  5 weeks   Treatment/Interventions Aspiration precaution training;Trials of upgraded texture/liquids;Compensatory strategies;Functional tasks;Patient/family education;Cueing hierarchy;Diet toleration management by SLP;Cognitive reorganization;Compensatory techniques;SLP instruction and feedback;Internal/external aids;Multimodal communcation approach   Potential to Achieve Goals Fair   Potential Considerations Ability to learn/carryover information;Co-morbidities;Severity of impairments   Consulted and Agree with Plan of Care Family member/caregiver      Patient will benefit from skilled therapeutic intervention in  order to improve the following deficits and impairments:   Dysphagia, oropharyngeal phase  Cognitive communication deficit  Dysarthria and anarthria    Problem List Patient Active Problem List   Diagnosis Date Noted  . Tracheostomy status ([facility]) 09/26/2015  . Allergic rhinitis 09/26/2015  . 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 Movement disorder 09/22/2015  . Encephalitis 09/22/2015  . Chest pain 02/07/2014  . Premature atrial contractions 01/13/2014  . Abnormality of gait 12/04/2013  . Hyperlipidemia 12/21/2011  . Cardiovascular risk factor 12/21/2011  . Bunion 01/31/2008  . Metatarsalgia of both feet 09/20/2007  . FLAT FOOT 09/20/2007  .  HALLUX RIGIDUS, ACQUIRED 09/20/2007    [place], CCC-SLP 10/20/2015, 10:33 AM  [facility] [address] [place], [place], [address] Phone: [phone]   Fax:  [phone]   Name: Judy Spears MRN: [id number] Date of Birth: 07-21-1950

## 2015-10-20 NOTE — Telephone Encounter (Signed)
The last office visit with you was March 2016. Do you want to prescribe atorvastatin 20mg  daily for her?

## 2015-10-20 NOTE — Patient Instructions (Signed)
PLEASE CALL ME WITH ANY PROBLEMS OR QUESTIONS ([phone])  

## 2015-10-22 ENCOUNTER — Telehealth: Payer: Self-pay

## 2015-10-22 NOTE — Telephone Encounter (Signed)
PA was done in covermymeds and approved on 10/22/15

## 2015-10-23 ENCOUNTER — Encounter: Payer: Self-pay | Admitting: Occupational Therapy

## 2015-10-23 ENCOUNTER — Encounter: Payer: Self-pay | Admitting: Rehabilitation

## 2015-10-23 ENCOUNTER — Ambulatory Visit: Payer: BC Managed Care – PPO | Admitting: Rehabilitation

## 2015-10-23 ENCOUNTER — Ambulatory Visit: Payer: BC Managed Care – PPO | Admitting: Occupational Therapy

## 2015-10-23 ENCOUNTER — Ambulatory Visit: Payer: BC Managed Care – PPO

## 2015-10-23 DIAGNOSIS — R278 Other lack of coordination: Secondary | ICD-10-CM

## 2015-10-23 DIAGNOSIS — M25611 Stiffness of right shoulder, not elsewhere classified: Secondary | ICD-10-CM

## 2015-10-23 DIAGNOSIS — R293 Abnormal posture: Secondary | ICD-10-CM

## 2015-10-23 DIAGNOSIS — M6281 Muscle weakness (generalized): Secondary | ICD-10-CM

## 2015-10-23 DIAGNOSIS — R41841 Cognitive communication deficit: Secondary | ICD-10-CM

## 2015-10-23 DIAGNOSIS — R1312 Dysphagia, oropharyngeal phase: Secondary | ICD-10-CM

## 2015-10-23 DIAGNOSIS — R471 Dysarthria and anarthria: Secondary | ICD-10-CM

## 2015-10-23 DIAGNOSIS — R2689 Other abnormalities of gait and mobility: Secondary | ICD-10-CM

## 2015-10-23 DIAGNOSIS — R41844 Frontal lobe and executive function deficit: Secondary | ICD-10-CM

## 2015-10-23 DIAGNOSIS — M25612 Stiffness of left shoulder, not elsewhere classified: Secondary | ICD-10-CM

## 2015-10-23 DIAGNOSIS — R2681 Unsteadiness on feet: Secondary | ICD-10-CM

## 2015-10-23 NOTE — Therapy (Signed)
[facility] [address] [place], [place], [address] Phone: [phone]   Fax:  [phone]  Occupational Therapy Treatment  Patient Details  Name: Judy Spears MRN: [id number] Date of Birth: [birth year] Referring Provider: Dr. Sharion Dove  Encounter Date: 10/23/2015      OT End of Session - 10/23/15 1702    Visit Number 12   Number of Visits 24   Date for OT Re-Evaluation 11/18/15   Authorization Type [employer] unlimited visits   OT Start Time [id number]   OT Stop Time 1617   OT Time Calculation (min) 47 min   Activity Tolerance Patient tolerated treatment well      Past Medical History:  Diagnosis Date  . Arthritis    lt great toe  . Complication of anesthesia    pt has had headaches post op-did advise to hydra well preop  . Hair loss    right-sided  . History of exercise stress test    a. ETT (12/15) with 12:00 exercise, no ST changes, occasional PACs.   . Hyperlipidemia   . Insomnia   . Migraine headache   . 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 Postmenopausal   . Premature atrial contractions    a. Holter (12/15) with 8% PACs, no atrial runs or atrial fibrillation.     Past Surgical History:  Procedure Laterality Date  . BREAST BIOPSY  01/01/2011   Procedure: BREAST BIOPSY WITH NEEDLE LOCALIZATION;  Surgeon: Rolm Bookbinder, MD;  Location: [facility];  Service: General;  Laterality: Left;  left breast wire localization biopsy  . cataracts right eye    . CESAREAN SECTION  1986  . HERNIA REPAIR  2000   RIH  . opirectinal membrane peel    . RHINOPLASTY  1983    There were no vitals filed for this visit.      Subjective Assessment - 10/23/15 1654    Patient is accompained by: Family member  husband and dtr   Pertinent History see epic snapshot - ABI/hypoxia from [place]   Patient Stated Goals Pt unable to state.    Currently in Pain? Yes   Pain Score --  unable to  rate   Pain Location Hip   Pain Orientation Left   Pain Descriptors / Indicators --  unable to describe but answered yes and pointed to posterior aspect of L hip when stretching LLE   Pain Type Chronic pain   Pain Onset More than a month ago   Pain Frequency Intermittent   Aggravating Factors  stretching LLE   Pain Relieving Factors flexed position                      OT Treatments/Exercises (OP) - 10/23/15 0001      Neurological Re-education Exercises   Other Exercises 1 Neuro re ed to address static and dynamic sitting balance with emphasis on controlled forward lean, finding midline, sideleaning on either elbow and beginning scooting.  Also addressed sit to stand with max facilitation for LLE knee and hip extension with neutral rotation as well as thoraxic extension. Pt with improved ability to initiate with functional task requiring use of LUE to reach up for task.  Also addressed alignment and available range of LLE in supine for pt to achieve full LLE knee and hip extension at the same time - pt unable to tolerate and indicated pain in left hip with attempt.  Pt was able to more actively extend in standing once weight was more forward over feet vs posterior.  Addressed active wheelchair to mat and mat to wheelchair transfers.  Pt initiates leaning forward and pushing with LE's to assist with transfer.                   OT Short Term Goals - 10/23/15 1700      OT SHORT TERM GOAL #1   Title Pt and husband will be mod I with HEP - 10/21/2015   Status On-going     OT SHORT TERM GOAL #2   Title Pt will demonstrate adequate postural control for breath support to voice at least 3 words   Status Achieved     OT SHORT TERM GOAL #3   Title Pt will transfer to commode with moderate assistance 75% of the time.   Status On-going     OT SHORT TERM GOAL #4   Title Pt will tolerate positional changes as evidenced by respiration rate and motor quieting in preparation  for funcitonal mobility   Status Achieved           OT Long Term Goals - 10/23/15 1700      OT LONG TERM GOAL #1   Title Pt and husband will be mod I with upgraded HEP - 11/18/2015   Status On-going     OT LONG TERM GOAL #2   Title Pt will demonstrate adequate postural control to support 5 words in supported sitting.   Status On-going     OT LONG TERM GOAL #3   Title Pt will demonstrate ability for low reach with RUE for functional task with  mod facilitation   Status On-going     OT LONG TERM GOAL #4   Title Pt will wash face with LUE with min a in supported sitting   Status On-going     OT LONG TERM GOAL #5   Title Pt will release objects with L hand with min faciltation    Status On-going     OT LONG TERM GOAL #6   Title Pt will stand with min a while caregiver manages pants during LB dressing 75% of the time.   Status On-going     OT LONG TERM GOAL #7   Title Pt will transfer to commode with min a 75% of the time.   Status On-going     OT LONG TERM GOAL #8   Title Pt will be able to sit unsupported with supervision 50% of the time while engaged in functional task.   Status On-going               Plan - 10/23/15 1700    Clinical Impression Statement Pt with slow progress toward goals. Pt is demonstrating some improvement in sitting balance and alignment.     Rehab Potential Fair   Clinical Impairments Affecting Rehab Potential severity of deficits, slow rate of recovery   OT Frequency 3x / week   OT Duration 8 weeks   OT Treatment/Interventions Self-care/ADL training;Ultrasound;Moist Heat;Electrical Stimulation;DME and/or AE instruction;Neuromuscular education;Therapeutic exercise;Functional Mobility Training;Manual Therapy;Passive range of motion;Splinting;Therapeutic activities;Balance training;Patient/family education;Visual/perceptual remediation/compensation;Cognitive remediation/compensation   Plan trunk control (active extension in kneeling,  standing), level surface transfers, sit to stand, stand to sit, weight shifting, functional use of hands   OT Home Exercise Plan Discussed with patient, with husband present, the importance of flexing forward and using legs for transfers and standing   Consulted and Agree with  Plan of Care Patient;Family member/caregiver   Family Member Consulted caregiver [name], husband [name]      Patient will benefit from skilled therapeutic intervention in order to improve the following deficits and impairments:  Decreased endurance, Decreased skin integrity, Impaired vision/preception, Improper body mechanics, Impaired perceived functional ability, Decreased knowledge of precautions, Cardiopulmonary status limiting activity, Decreased activity tolerance, Decreased knowledge of use of DME, Decreased strength, Impaired flexibility, Improper spinal/pelvic alignment, Impaired sensation, Difficulty walking, Decreased mobility, Decreased balance, Decreased cognition, Decreased range of motion, Impaired tone, Pain, Impaired UE functional use, Decreased safety awareness, Decreased coordination  Visit Diagnosis: Other abnormalities of gait and mobility  Other lack of coordination  Abnormal posture  Muscle weakness (generalized)  Stiffness of right shoulder, not elsewhere classified  Stiffness of left shoulder, not elsewhere classified  Frontal lobe and executive function deficit    Problem List Patient Active Problem List   Diagnosis Date Noted  . 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 Tracheostomy status ([facility]) 09/26/2015  . Allergic rhinitis 09/26/2015  . 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 Movement disorder 09/22/2015  . Encephalitis 09/22/2015  . Chest pain 02/07/2014  . Premature atrial contractions 01/13/2014  . Abnormality of gait 12/04/2013  . Hyperlipidemia 12/21/2011  . Cardiovascular risk factor 12/21/2011  . Bunion 01/31/2008  . Metatarsalgia of both feet 09/20/2007  . FLAT FOOT 09/20/2007   . HALLUX RIGIDUS, ACQUIRED 09/20/2007    Quay Burow, OTR/L 10/23/2015, 5:04 PM  [facility] [address] [place], [place], [address] Phone: [phone]   Fax:  [phone]  Name: Judy Spears MRN: [id number] Date of Birth: May 17, 1950

## 2015-10-23 NOTE — Therapy (Signed)
[facility] [address] [place], [place], [address] Phone: [phone]   Fax:  [phone]  Speech Language Pathology Treatment  Patient Details  Name: Judy Spears MRN: [id number] Date of Birth: 04/24/1950 Referring Provider: Dr. Alger Simons  Encounter Date: 10/23/2015      End of Session - 10/23/15 1604    Visit Number 13   Number of Visits 24   Date for SLP Re-Evaluation 11/18/15   SLP Start Time 1449   SLP Stop Time  1533   SLP Time Calculation (min) 44 min   Activity Tolerance Patient limited by fatigue;Patient limited by lethargy      Past Medical History:  Diagnosis Date  . Arthritis    lt great toe  . Complication of anesthesia    pt has had headaches post op-did advise to hydra well preop  . Hair loss    right-sided  . History of exercise stress test    a. ETT (12/15) with 12:00 exercise, no ST changes, occasional PACs.   . Hyperlipidemia   . Insomnia   . Migraine headache   . 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 Postmenopausal   . Premature atrial contractions    a. Holter (12/15) with 8% PACs, no atrial runs or atrial fibrillation.     Past Surgical History:  Procedure Laterality Date  . BREAST BIOPSY  01/01/2011   Procedure: BREAST BIOPSY WITH NEEDLE LOCALIZATION;  Surgeon: Rolm Bookbinder, MD;  Location: [facility];  Service: General;  Laterality: Left;  left breast wire localization biopsy  . cataracts right eye    . CESAREAN SECTION  1986  . HERNIA REPAIR  2000   RIH  . opirectinal membrane peel    . RHINOPLASTY  1983    There were no vitals filed for this visit.      Subjective Assessment - 10/23/15 1456    Subjective "She swallowed three tiny cups of water in the hall." [name], caregiver)   Patient is accompained by: --  [name] husband and lisa , caregiver.               ADULT SLP TREATMENT - 10/23/15 0001      General Information   Behavior/Cognition Cooperative;Pleasant mood;Decreased sustained attention;Distractible  somnolent approx 1/2-2/3 of session     Treatment Provided   Treatment provided Cognitive-Linquistic;Dysphagia     Dysphagia Treatment   Temperature Spikes Noted No   Respiratory Status Room air   Oral Cavity - Dentition Adequate natural dentition   Treatment Methods Skilled observation;Therapeutic exercise   Patient observed directly with PO's Yes   Type of PO's observed --  1/4 teaspoon icewater   Feeding Total assist   Liquids provided via Teaspoon   Oral Phase Signs & Symptoms Anterior loss/spillage;Oral holding   Pharyngeal Phase Signs & Symptoms Wet vocal quality;Suspected delayed swallow initiation;Delayed cough   Type of cueing Verbal;Tactile;Visual  demonstration   Amount of cueing Maximal   Other treatment/comments swallows occurred 4/12 attempts, when pt most alert, with max verbal, visual, demo, and tactile cues. Pt remained somnolent despite SLP utilizing cold stimuli and other tactile stimulation to attempt to arouse pt and incr alertness. Throat clearing very weak, not functional.      Cognitive-Linquistic Treatment   Treatment focused on Dysarthria;Voice;Aphasia;Apraxia   Skilled Treatment Pt achieved voicing approx 15% of attempts with max consistent tactile, visual, verbal cues. Other responses were aphonic and/or with reduced articualtory ROM  making intelligibility very difficult. When pt was most alert, she answered simple "wh" questions re: landmark locations (cities) and personal information with 60% frequency and 80% accuracy.      Assessment / Recommendations / Plan   Plan Continue with current plan of care;Other (Comment)  begin to consider x2/week?     Progression Toward Goals   Progression toward goals --  pt somnolence negatively impacts therapy goals            SLP Short Term Goals - 10/23/15 1609      SLP SHORT TERM GOAL #1   Title Pt will demonstrate  audible phonation with prolonged vowels and cv syllables 12/15 trials with occasional min A   Time 1   Period Weeks   Status Not Met     SLP SHORT TERM GOAL #2   Title Pt will demonstrate breath support prior to repetition of words 8/10x   Status Not Met     SLP SHORT TERM GOAL #3   Title Pt will swallow ice chip/H20 via spoon in less than 7 seconds with usual min to mod A 3/5 trials   Status Not Met     SLP SHORT TERM GOAL #4   Title Pt will sustain attention to therapy task for over 7 minutes with occasional min A   Status Not Met          SLP Long Term Goals - 10/23/15 1610      SLP LONG TERM GOAL #1   Title Pt will demonstrate audible phonation/intelligible speech with 2 word phrases with occasional min A 5/7 utterances   Time 4   Period Weeks   Status On-going     SLP LONG TERM GOAL #2   Title Pt will demonstrate audible cough/throat clear to command and to clear secretions with occasional min A 4/5 trials.    Time 4   Period Weeks   Status On-going     SLP LONG TERM GOAL #3   Title Pt will achieve timely oral phase and swallow with ice chip/ water trials 5/7 trials   Time 4   Period Weeks   Status On-going     SLP LONG TERM GOAL #4   Title Pt will sustain attention to therapy tasks for 12 minutes with occasiona min A   Time 4   Period Weeks   Status On-going          Plan - 10/23/15 1605    Clinical Impression Statement Pt with somnolence for 1/2 to 2/3 of session today. When pt most alert, expressive responses and attempts at swallows were most frequent. During these times, pt's reduced articulatory ROM occurred less often but still made listener comprehension more challenging. With swallowing 1/4-1/2 teaspoon icewater, 8 trials attempted, swallows x3 were observed, and wet voice or audible respiration consistently noted after presentations.   Speech Therapy Frequency 3x / week   Duration 4 weeks  or 13 visits   Treatment/Interventions Aspiration  precaution training;Trials of upgraded texture/liquids;Compensatory strategies;Functional tasks;Patient/family education;Cueing hierarchy;Diet toleration management by SLP;Cognitive reorganization;Compensatory techniques;SLP instruction and feedback;Internal/external aids;Multimodal communcation approach   Potential to Achieve Goals Fair   Potential Considerations Ability to learn/carryover information;Co-morbidities;Severity of impairments      Patient will benefit from skilled therapeutic intervention in order to improve the following deficits and impairments:   Dysarthria and anarthria  Cognitive communication deficit  Dysphagia, oropharyngeal phase    Problem List Patient Active Problem List   Diagnosis Date Noted  . Spastic  tetraplegia ([facility]) 10/20/2015  . Tracheostomy status ([facility]) 09/26/2015  . Allergic rhinitis 09/26/2015  . 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 Movement disorder 09/22/2015  . Encephalitis 09/22/2015  . Chest pain 02/07/2014  . Premature atrial contractions 01/13/2014  . Abnormality of gait 12/04/2013  . Hyperlipidemia 12/21/2011  . Cardiovascular risk factor 12/21/2011  . Bunion 01/31/2008  . Metatarsalgia of both feet 09/20/2007  . FLAT FOOT 09/20/2007  . HALLUX RIGIDUS, ACQUIRED 09/20/2007    [facility] ,MS, CCC-SLP  10/23/2015, 4:19 PM  [facility] [address] [place], [place], [address] Phone: [phone]   Fax:  [phone]   Name: Judy Spears MRN: [id number] Date of Birth: 01/03/1951

## 2015-10-23 NOTE — Therapy (Signed)
[facility] [address] [place], [place], [address] Phone: [phone]   Fax:  [phone]  Physical Therapy Treatment  Patient Details  Name: Judy Spears MRN: [id number] Date of Birth: 1950/08/15 Referring Provider: Alger Simons, MD Sharion Dove, MD)  Encounter Date: 10/23/2015      PT End of Session - 10/23/15 1611    Visit Number 13   Number of Visits 25   Date for PT Re-Evaluation 11/22/15   Authorization Type [employer]   PT Start Time 1401   PT Stop Time 1445   PT Time Calculation (min) 44 min   Equipment Utilized During Treatment Other (comment)  BWSS   Activity Tolerance Patient tolerated treatment well   Behavior During Therapy [facility] for tasks assessed/performed      Past Medical History:  Diagnosis Date  . Arthritis    lt great toe  . Complication of anesthesia    pt has had headaches post op-did advise to hydra well preop  . Hair loss    right-sided  . History of exercise stress test    a. ETT (12/15) with 12:00 exercise, no ST changes, occasional PACs.   . Hyperlipidemia   . Insomnia   . Migraine headache   . 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 Postmenopausal   . Premature atrial contractions    a. Holter (12/15) with 8% PACs, no atrial runs or atrial fibrillation.     Past Surgical History:  Procedure Laterality Date  . BREAST BIOPSY  01/01/2011   Procedure: BREAST BIOPSY WITH NEEDLE LOCALIZATION;  Surgeon: Rolm Bookbinder, MD;  Location: [facility];  Service: General;  Laterality: Left;  left breast wire localization biopsy  . cataracts right eye    . CESAREAN SECTION  1986  . HERNIA REPAIR  2000   RIH  . opirectinal membrane peel    . RHINOPLASTY  1983    There were no vitals filed for this visit.      Subjective Assessment - 10/23/15 1609    Subjective Pt arrived accompanied by husband and caregiver.  Caregiver [name] notes that she slept full night last  night, was sleepy this morning, but seems more alert this afternoon.     Patient is accompained by: Family member  caregiver lisa and husband [name]   Pertinent History Precautions: trach (capped), PEG, seizure. 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 No/denies  seemed to have discomfort when lying supine today            NMR:  Addressed NMR for improved trunk control and BLE/UE controlled activation via forced use, tall kneeling, controlled stepping/retro stepping in tall kneeling, transitional movements from sit<>supine (complete dependent assist for getting supine>sit due to discomfort in supine position), rolling, elbow propping, side sitting and into tall kneeling.  Note markedly better trunk control with UEs supported on PTs shoulders with PT providing support at elbows to further prevent downward flexed position.  Other PT Benjie Karvonen Hobble, PT) providing assist as needed for lateral weight shift and improved hip protraction.  Squat pivot transfer and attempted stand pivot transfer mat<>w/c during session with focus on forward weight shift and improved WB and push  through LEs.  Note improved ability to forward weight shift with max verbal cues and increased time to complete task.  Attempted sit<>stand at end of session x 2 reps, however pt unable to attain full standing with increased pushing into flexed position against assist for hip and trunk extension.                       PT Education - 10/23/15 1610    Education provided Yes   Education Details Continued education on importance of forward weight shift during transfers.    Person(s) Educated Patient;Spouse;Caregiver(s)   Methods  Demonstration;Explanation;Tactile cues;Verbal cues   Comprehension Verbalized understanding;Returned demonstration;Need further instruction          PT Short Term Goals - 10/23/15 1624      PT SHORT TERM GOAL #1   Title Pt will initiate anterior weight shift sufficient to unweight hips with supervision, 25% cueing in preparation for functional transfer.  (Target date: 10/21/15)   Baseline Can initiate with min A inconsistently with 50% cuing.     Status Partially Met     PT SHORT TERM GOAL #2   Title Pt will perform seated scooting (AP/PA and M/L) with supervision, 25% cueing to increase independence with transfers, repositioning. (10/21/15)   Status On-going     PT SHORT TERM GOAL #3   Title Pt will tolerate static standing with total A of standing frame x10 minutes with vital signs WNL to improve standing tolerance and for tone management.  (10/21/15)   Baseline met    Status Achieved     PT SHORT TERM GOAL #4   Title Pt will perform dynamic sitting >/= 5 minutes with BLE's supported, intermittent UE support with CGA to indicate improved postural stability/control necessary for functional transfers. (10/21/15)   Status On-going     PT SHORT TERM GOAL #5   Title Pt will perform sit <> side lying with mod A, 50% cueing to progress toward increased independence with bed mobility.  (10/21/15)   Baseline 9/22: R sidelying > sit with mod A.    Status Achieved     PT SHORT TERM GOAL #6   Title Pt will consistently perform sit <> stand with min A, 25% cueing to indicate increased independence with functional transfers.  (10/21/15)   Status On-going     PT SHORT TERM GOAL #7   Title Pt will perform level transfer from w/c <> mat table with mod A, 50% cueing to decrease caregiver burden with functional transfers.  (10/21/15)   Baseline mod/max A on 10/23/15, max A cues for sequencing and hand placement.    Status On-going           PT Long Term Goals - 09/23/15 2120      PT LONG TERM  GOAL #1   Title Pt will perform supine > sit with mod A, 50% cueing to indicate increased independence with getting out of bed.  (Target: 11/18/15)     PT LONG TERM GOAL #2   Title Pt will perform sit > supine with mod A, 50% cueing to indicate increased independence with getting into bed.  (11/18/15)     PT LONG TERM GOAL #3   Title Pt will perform level transfer from w/c <> mat table with min A, 25% cueing to decrease caregiver burden with functional transfers.  (11/18/15)     PT LONG TERM GOAL #4   Title Pt will perform dynamic standing >/= 2 consecutive  minutes with BUE support and mod, 50% cueing to indicate improved postural stanbility/control.   (11/18/15)     PT LONG TERM GOAL #5   Title Patient will ambulate x15' with max A of spouse using LRAD to enable pt/spouse to safely walk into/out of home bathroom.  (11/18/15)               Plan - 10/23/15 1613    Clinical Impression Statement Skilled session focused on BLE NMR via forced use, WB and activation into and out of transitional movements-sit<>supine, SL, propped on elbow, tall kneeling, side sitting and sit<>stand.  Continue to focus on upright posture at head and chest as this seems to be source of flexed position.  Note pt with some discomfort while in supine position but did very well with controlled movements in tall kneeling.    Rehab Potential Good   Clinical Impairments Affecting Rehab Potential Positive: strong family/social support; negative: chronicity of condition   PT Frequency 3x / week   PT Treatment/Interventions ADLs/Self Care Home Management;DME Instruction;Gait training;Stair training;Functional mobility training;Therapeutic activities;Patient/family education;Cognitive remediation;Neuromuscular re-education;Therapeutic exercise;Balance training;Orthotic Fit/Training;Wheelchair mobility training;Manual techniques;Splinting;Visual/perceptual remediation/compensation;Vestibular;Passive range of motion   PT  Next Visit Plan sit <> stand at // bars. Seated scooting (forearm propping + reciprocal scooting; A/P scooting with B forearm support on raised surface). If skilled +2A available: quadruped, gait with AFO's, physioball for pelvic mobility/dissociation.  If no skilled +2A: standing frame, partial standing with BUE support, sit <> stand, seated scooting, supine bridging to scoot, seated reaching for anterior weight shift, or bed mobility.   Consulted and Agree with Plan of Care Patient;Family member/caregiver   Family Member Consulted husband, [name] and caregiver, [name]      Patient will benefit from skilled therapeutic intervention in order to improve the following deficits and impairments:  Abnormal gait, Decreased balance, Decreased endurance, Decreased cognition, Cardiopulmonary status limiting activity, Decreased activity tolerance, Decreased coordination, Decreased strength, Impaired flexibility, Impaired tone, Decreased mobility, Decreased skin integrity, Increased muscle spasms, Impaired vision/preception, Decreased range of motion, Impaired UE functional use, Postural dysfunction, Pain  Visit Diagnosis: Abnormal posture  Other abnormalities of gait and mobility  Muscle weakness (generalized)  Unsteadiness on feet     Problem List Patient Active Problem List   Diagnosis Date Noted  . 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[place]) 10/20/2015  . Tracheostomy status ([place]) 09/26/2015  . Allergic rhinitis 09/26/2015  . 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 Movement disorder 09/22/2015  . Encephalitis 09/22/2015  . Chest pain 02/07/2014  . Premature atrial contractions 01/13/2014  . Abnormality of gait 12/04/2013  . Hyperlipidemia 12/21/2011  . Cardiovascular risk factor 12/21/2011  . Bunion 01/31/2008  . Metatarsalgia of both feet 09/20/2007  . FLAT FOOT 09/20/2007  . HALLUX RIGIDUS, ACQUIRED 09/20/2007    Rinaldo Cloud Betha Loa 10/23/2015, 7:10 PM  [facility] [address] [place], [place], [address] Phone: [phone]   Fax:  [phone]  Name: Judy Spears MRN: [id number] Date of Birth: 1950-08-04

## 2015-10-24 ENCOUNTER — Ambulatory Visit: Payer: BC Managed Care – PPO

## 2015-10-24 ENCOUNTER — Ambulatory Visit: Payer: BC Managed Care – PPO | Admitting: Physical Therapy

## 2015-10-24 DIAGNOSIS — R41841 Cognitive communication deficit: Secondary | ICD-10-CM

## 2015-10-24 DIAGNOSIS — R293 Abnormal posture: Secondary | ICD-10-CM

## 2015-10-24 DIAGNOSIS — R2689 Other abnormalities of gait and mobility: Secondary | ICD-10-CM

## 2015-10-24 DIAGNOSIS — M6281 Muscle weakness (generalized): Secondary | ICD-10-CM

## 2015-10-24 DIAGNOSIS — R278 Other lack of coordination: Secondary | ICD-10-CM

## 2015-10-24 DIAGNOSIS — R2681 Unsteadiness on feet: Secondary | ICD-10-CM

## 2015-10-24 DIAGNOSIS — R471 Dysarthria and anarthria: Secondary | ICD-10-CM

## 2015-10-24 DIAGNOSIS — R1312 Dysphagia, oropharyngeal phase: Secondary | ICD-10-CM

## 2015-10-24 NOTE — Therapy (Signed)
[facility] [address] [place], [place], [address] Phone: [phone]   Fax:  [phone]  Speech Language Pathology Treatment  Patient Details  Name: Judy Spears MRN: [id number] Date of Birth: 1950/09/17 Referring Provider: Dr. Alger Simons  Encounter Date: 10/24/2015      End of Session - 10/24/15 1116    Visit Number 14   Number of Visits 24   Date for SLP Re-Evaluation 11/18/15   SLP Start Time 0938   SLP Stop Time  1017   SLP Time Calculation (min) 39 min   Activity Tolerance Patient limited by fatigue;Patient limited by lethargy      Past Medical History:  Diagnosis Date  . Arthritis    lt great toe  . Complication of anesthesia    pt has had headaches post op-did advise to hydra well preop  . Hair loss    right-sided  . History of exercise stress test    a. ETT (12/15) with 12:00 exercise, no ST changes, occasional PACs.   . Hyperlipidemia   . Insomnia   . Migraine headache   . 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 Postmenopausal   . Premature atrial contractions    a. Holter (12/15) with 8% PACs, no atrial runs or atrial fibrillation.     Past Surgical History:  Procedure Laterality Date  . BREAST BIOPSY  01/01/2011   Procedure: BREAST BIOPSY WITH NEEDLE LOCALIZATION;  Surgeon: Rolm Bookbinder, MD;  Location: [facility];  Service: General;  Laterality: Left;  left breast wire localization biopsy  . cataracts right eye    . CESAREAN SECTION  1986  . HERNIA REPAIR  2000   RIH  . opirectinal membrane peel    . RHINOPLASTY  1983    There were no vitals filed for this visit.      Subjective Assessment - 10/24/15 1012    Subjective Pt responded with "[employer]" when asked where she did her undergraduate work   Patient is accompained by: Family member  spouse, [name], and Chiropodist, caregiver               ADULT SLP TREATMENT - 10/24/15 0001      General  Information   Behavior/Cognition Cooperative;Pleasant mood;Decreased sustained attention;Distractible  Pt alert initially, but became lethargic as session progress   Patient Positioning Upright in chair   Oral care provided N/A     Treatment Provided   Treatment provided Dysphagia;Cognitive-Linquistic     Dysphagia Treatment   Temperature Spikes Noted No   Respiratory Status Room air   Oral Cavity - Dentition Adequate natural dentition   Treatment Methods Skilled observation;Therapeutic exercise;Patient/caregiver education   Patient observed directly with PO's Yes   Type of PO's observed Thin liquids   Feeding Total assist   Liquids provided via Teaspoon   Oral Phase Signs & Symptoms Anterior loss/spillage   Pharyngeal Phase Signs & Symptoms Suspected delayed swallow initiation   Type of cueing Verbal;Tactile;Visual   Amount of cueing Maximal   Other treatment/comments SLP facilitated pt's oropharyngeal swallow with wet spoon and 1/4 teaspoons ice water, 9/41 presentations elicited swallow responses, one at 8 seconds and SLP visual, verbal , tactile cues, and three within 3 seconds of presentation. Anterior labial leakage seen 6/13 presentations. Pt somnolent for 2/3-3/4 of session despite husband repositioning pt/standing pt. as well as SLP performing tactile, auditory stimulation for attempts at arousal.      Pain Assessment  Pain Assessment Faces   Pain Score 0-No pain     Cognitive-Linquistic Treatment   Treatment focused on Dysarthria;Voice;Aphasia;Apraxia;Patient/family/caregiver education   Skilled Treatment Pt achieved voicing with "ah", "hey!", personal "wh" questions, <10% of attempts with consistent SLP tactile, visual, verbal and demo cues. Responses were mostly aphonic and/or with reduced articualtory ROM making intelligibility very difficult unless listener was expecting the answer pt provided.  Deep breathing was attempted for vocalization with pt participation approx  75% of the time.     Assessment / Recommendations / Plan   Plan Continue with current plan of care     Progression Toward Goals   Progression toward goals Not progressing toward goals (comment)  arousal level plays significant role in pt progress          SLP Education - 10/24/15 1116    Education provided Yes   Education Details Pt/spouse educated on importance of encouraging vocalization when alert   Person(s) Educated Patient;Spouse   Methods Explanation;Tactile cues;Demonstration;Verbal cues   Comprehension Verbalized understanding          SLP Short Term Goals - 10/24/15 1052      SLP SHORT TERM GOAL #1   Title Pt will demonstrate audible phonation with prolonged vowels and cv syllables 12/15 trials with occasional min A   Time --   Period --   Status Not Met     SLP SHORT TERM GOAL #2   Title Pt will demonstrate breath support prior to repetition of words 8/10x   Time --   Period --   Status Not Met     SLP SHORT TERM GOAL #3   Title Pt will swallow ice chip/H20 via spoon in less than 7 seconds with usual min to mod A 3/5 trials   Baseline 9/25,    Time --   Period --   Status Not Met     SLP SHORT TERM GOAL #4   Title Pt will sustain attention to therapy task for over 7 minutes with occasional min A   Time --   Period --   Status Not Met          SLP Long Term Goals - 10/24/15 1054      SLP LONG TERM GOAL #1   Title Pt will demonstrate audible phonation/intelligible speech with 2 word phrases with occasional min A 5/7 utterances   Time 4   Period Weeks   Status On-going     SLP LONG TERM GOAL #2   Title Pt will demonstrate audible cough/throat clear to command and to clear secretions with occasional min A 4/5 trials.    Time 4   Period Weeks   Status On-going     SLP LONG TERM GOAL #3   Title Pt will achieve timely oral phase and swallow with ice chip/ water trials 5/7 trials   Time 4   Period Weeks   Status On-going     SLP LONG TERM  GOAL #4   Title Pt will sustain attention to therapy tasks for 12 minutes with occasiona min A   Time 4   Period Weeks   Status On-going          Plan - 10/24/15 1045    Clinical Impression Statement Pt initially alert, but required repositioning by spouse, and verbal and tactile stim by spouse and SLP to maintain alertness. SLP provided wet spoon and 1/4 teaspoons icewater to pt, counted to 3 and encouraged effortful swallow. Pt elicited swallow using  this strategy 3/13 times. SLP engaged in deep breathing exercises followed by encouragement to vocalize, with <10% success. Skilled ST may cont to bring pt benefit for swallowing as well as vocalization however putting ST on hold may want to be considered due to pt's diffiuclty with remaining alert for average of approx. 20 minutes total during ST sessions.   Speech Therapy Frequency 3x / week   Duration 4 weeks  or 12 visits   Treatment/Interventions Aspiration precaution training;Trials of upgraded texture/liquids;Compensatory strategies;Functional tasks;Patient/family education;Cueing hierarchy;Diet toleration management by SLP;Cognitive reorganization;Compensatory techniques;SLP instruction and feedback;Internal/external aids;Multimodal communcation approach   Potential to Achieve Goals Fair   Potential Considerations Ability to learn/carryover information;Co-morbidities;Severity of impairments   Consulted and Agree with Plan of Care Family member/caregiver   Family Member Consulted [name]- spouse        Patient will benefit from skilled therapeutic intervention in order to improve the following deficits and impairments:   Dysphagia, oropharyngeal phase  Cognitive communication deficit  Dysarthria and anarthria    Problem List Patient Active Problem List   Diagnosis Date Noted  . 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[place]) 10/20/2015  . Tracheostomy status ([place]) 09/26/2015  . Allergic rhinitis 09/26/2015  . 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  Movement disorder 09/22/2015  . Encephalitis 09/22/2015  . Chest pain 02/07/2014  . Premature atrial contractions 01/13/2014  . Abnormality of gait 12/04/2013  . Hyperlipidemia 12/21/2011  . Cardiovascular risk factor 12/21/2011  . Bunion 01/31/2008  . Metatarsalgia of both feet 09/20/2007  . FLAT FOOT 09/20/2007  . HALLUX RIGIDUS, ACQUIRED 09/20/2007    [facility] ,[place], CCC-SLP 10/24/2015, 2:07 PM  [facility] [address] [place], [place], [address] Phone: [phone]   Fax:  [phone]   Name: TEMITOPE FLAMMER MRN: [id number] Date of Birth: 1950-03-14

## 2015-10-24 NOTE — Therapy (Signed)
[facility] [address] [place], [place], [address] Phone: [phone]   Fax:  [phone]  Physical Therapy Treatment  Patient Details  Name: Judy Spears MRN: [id number] Date of Birth: 11-Nov-1950 Referring Provider: Alger Simons, MD Sharion Dove, MD)  Encounter Date: 10/24/2015      PT End of Session - 10/24/15 1507    Visit Number 14   Number of Visits 25   Date for PT Re-Evaluation 11/22/15   Authorization Type BC[employer]   PT Start Time 1017   PT Stop Time 1105   PT Time Calculation (min) 48 min   Activity Tolerance Patient limited by fatigue   Behavior During Therapy [facility] for tasks assessed/performed      Past Medical History:  Diagnosis Date  . Arthritis    lt great toe  . Complication of anesthesia    pt has had headaches post op-did advise to hydra well preop  . Hair loss    right-sided  . History of exercise stress test    a. ETT (12/15) with 12:00 exercise, no ST changes, occasional PACs.   . Hyperlipidemia   . Insomnia   . Migraine headache   . 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 Postmenopausal   . Premature atrial contractions    a. Holter (12/15) with 8% PACs, no atrial runs or atrial fibrillation.     Past Surgical History:  Procedure Laterality Date  . BREAST BIOPSY  01/01/2011   Procedure: BREAST BIOPSY WITH NEEDLE LOCALIZATION;  Surgeon: Rolm Bookbinder, MD;  Location: [place];  Service: General;  Laterality: Left;  left breast wire localization biopsy  . cataracts right eye    . CESAREAN SECTION  1986  . HERNIA REPAIR  2000   RIH  . opirectinal membrane peel    . RHINOPLASTY  1983    There were no vitals filed for this visit.      Subjective Assessment - 10/24/15 1502    Subjective Caregiver [name] reports pt has had a better day today as compared with yesterday. [name] believes teariness was due to pt having started new stimulant medication yesterday.     Patient is accompained by: Family member  husband, [name] and caregiver, [name]   Pertinent History Precautions: trach (capped), PEG, seizure. 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 No/denies  No overt signs of pain.      NMR: Level squat-pivot transfer from personal tilt-in-space w/c <> mat table with mod A, multimodal cueing for foot placement, full anterior weight shift (pt initiated ~50% of weight shift without assist or hands-on cueing), for head placement.  Dynamic tall kneeling activities performed to improve trunk/pelvic mobility, increased selective control of hip extension to facilitate upright posture during transfers/pre-gait. During tall kneeling, pt required B HHA (of therapist facing pt) for thoracic spine extension/upright posture; and manual facilitation (via therapist positioned behind pt) of B hip extension, increased L pelvic protraction, and R trunk shortening. While in tall kneeling, pt performed forward/retro stepping with BLE's x5 reps per side and RUE reaching (anterolateral and across midline). Sit <> stand from elevated mat table with B HHA of therapist (positioned anterior to pt) with 2  additional therapists providing max manual facilitation of full B knee extension (counterforce at anterior knees, posterior thighs) and SPT positioned posterior to pt providing manual facilitation at ribcage to promote full thoracic extension. Once in standing, progressed to lateral weight shift to L with RLE advancement x3 reps with max multimodal cueing as described above in addition to manual facilitation of lateral weight shift to L. Pt able to advance RLE; however, note excessive R hip adduction due to  adductor tone. - Provided patient/family with trial [employer] Comfort-Mate Extra Cushion for increased stability (for increased independence with transfers) and to maintain hips in more neutral rotation (as opposed to excessive hip adduction with excitatory tone).                            PT Education - 10/23/15 1610    Education provided Yes   Education Details Continued education on importance of forward weight shift during transfers.    Person(s) Educated Patient;Spouse;Caregiver(s)   Methods Demonstration;Explanation;Tactile cues;Verbal cues   Comprehension Verbalized understanding;Returned demonstration;Need further instruction          PT Short Term Goals - 10/23/15 1624      PT SHORT TERM GOAL #1   Title Pt will initiate anterior weight shift sufficient to unweight hips with supervision, 25% cueing in preparation for functional transfer.  (Target date: 10/21/15)   Baseline Can initiate with min A inconsistently with 50% cuing.     Status Partially Met     PT SHORT TERM GOAL #2   Title Pt will perform seated scooting (AP/PA and M/L) with supervision, 25% cueing to increase independence with transfers, repositioning. (10/21/15)   Status On-going     PT SHORT TERM GOAL #3   Title Pt will tolerate static standing with total A of standing frame x10 minutes with vital signs WNL to improve standing tolerance and for tone management.  (10/21/15)   Baseline met    Status Achieved     PT SHORT TERM GOAL #4   Title Pt will perform dynamic sitting >/= 5 minutes with BLE's supported, intermittent UE support with CGA to indicate improved postural stability/control necessary for functional transfers. (10/21/15)   Status On-going     PT SHORT TERM GOAL #5   Title Pt will perform sit <> side lying with mod A, 50% cueing to progress toward increased independence with bed mobility.  (10/21/15)   Baseline 9/22: R sidelying > sit with mod A.    Status Achieved     PT SHORT  TERM GOAL #6   Title Pt will consistently perform sit <> stand with min A, 25% cueing to indicate increased independence with functional transfers.  (10/21/15)   Status On-going     PT SHORT TERM GOAL #7   Title Pt will perform level transfer from w/c <> mat table with mod A, 50% cueing to decrease caregiver burden with functional transfers.  (10/21/15)   Baseline mod/max A on 10/23/15, max A cues for sequencing and hand placement.    Status On-going           PT Long Term Goals - 09/23/15 2120      PT LONG TERM GOAL #1   Title Pt will perform supine > sit with mod A, 50% cueing to indicate increased independence with getting out of bed.  (Target: 11/18/15)     PT LONG TERM GOAL #2   Title Pt will perform sit > supine with  mod A, 50% cueing to indicate increased independence with getting into bed.  (11/18/15)     PT LONG TERM GOAL #3   Title Pt will perform level transfer from w/c <> mat table with min A, 25% cueing to decrease caregiver burden with functional transfers.  (11/18/15)     PT LONG TERM GOAL #4   Title Pt will perform dynamic standing >/= 2 consecutive minutes with BUE support and mod, 50% cueing to indicate improved postural stanbility/control.   (11/18/15)     PT LONG TERM GOAL #5   Title Patient will ambulate x15' with max A of spouse using LRAD to enable pt/spouse to safely walk into/out of home bathroom.  (11/18/15)               Plan - 10/24/15 1508    Clinical Impression Statement Session focused on use of WB positions to facilitate selective control of B hip extension, to promote midline posture. Transitioned to pre-gait with emphasis on grading/control of movement.   Rehab Potential Good   Clinical Impairments Affecting Rehab Potential Positive: strong family/social support; negative: chronicity of condition   PT Frequency 3x / week   PT Duration 8 weeks   PT Treatment/Interventions ADLs/Self Care Home Management;DME Instruction;Gait training;Stair  training;Functional mobility training;Therapeutic activities;Patient/family education;Cognitive remediation;Neuromuscular re-education;Therapeutic exercise;Balance training;Orthotic Fit/Training;Wheelchair mobility training;Manual techniques;Splinting;Visual/perceptual remediation/compensation;Vestibular;Passive range of motion   PT Next Visit Plan Ask about trial w/c cushion. Check STG's. Sit <> stand at // bars. Seated scooting (forearm propping + reciprocal scooting; A/P scooting with B forearm support on raised surface). If skilled +2A available: quadruped, gait with AFO's, physioball for pelvic mobility/dissociation.  If no skilled +2A: standing frame, partial standing with BUE support, sit <> stand, seated scooting, supine bridging to scoot, seated reaching for anterior weight shift, or bed mobility.   Consulted and Agree with Plan of Care Patient;Family member/caregiver   Family Member Consulted husband, [name] and caregiver, [name]      Patient will benefit from skilled therapeutic intervention in order to improve the following deficits and impairments:  Abnormal gait, Decreased balance, Decreased endurance, Decreased cognition, Cardiopulmonary status limiting activity, Decreased activity tolerance, Decreased coordination, Decreased strength, Impaired flexibility, Impaired tone, Decreased mobility, Decreased skin integrity, Increased muscle spasms, Impaired vision/preception, Decreased range of motion, Impaired UE functional use, Postural dysfunction, Pain  Visit Diagnosis: Abnormal posture  Other abnormalities of gait and mobility  Muscle weakness (generalized)  Unsteadiness on feet  Other lack of coordination     Problem List Patient Active Problem List   Diagnosis Date Noted  . 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[place]) 10/20/2015  . Tracheostomy status ([place]) 09/26/2015  . Allergic rhinitis 09/26/2015  . 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 Movement disorder 09/22/2015  . Encephalitis 09/22/2015  .  Chest pain 02/07/2014  . Premature atrial contractions 01/13/2014  . Abnormality of gait 12/04/2013  . Hyperlipidemia 12/21/2011  . Cardiovascular risk factor 12/21/2011  . Bunion 01/31/2008  . Metatarsalgia of both feet 09/20/2007  . FLAT FOOT 09/20/2007  . HALLUX RIGIDUS, ACQUIRED 09/20/2007    Billie Ruddy, PT, DPT [facility] [address] [place], [place], [address] Phone: [phone]   Fax:  [phone] 10/24/15, 3:22 PM  Name: Judy Spears MRN: [id number] Date of Birth: 05/11/1950

## 2015-10-27 ENCOUNTER — Telehealth: Payer: Self-pay | Admitting: Physical Medicine & Rehabilitation

## 2015-10-27 ENCOUNTER — Ambulatory Visit: Payer: BC Managed Care – PPO | Attending: Physical Medicine & Rehabilitation | Admitting: *Deleted

## 2015-10-27 DIAGNOSIS — M25622 Stiffness of left elbow, not elsewhere classified: Secondary | ICD-10-CM | POA: Diagnosis present

## 2015-10-27 DIAGNOSIS — R41841 Cognitive communication deficit: Secondary | ICD-10-CM | POA: Insufficient documentation

## 2015-10-27 DIAGNOSIS — R293 Abnormal posture: Secondary | ICD-10-CM | POA: Diagnosis present

## 2015-10-27 DIAGNOSIS — M25612 Stiffness of left shoulder, not elsewhere classified: Secondary | ICD-10-CM | POA: Insufficient documentation

## 2015-10-27 DIAGNOSIS — M25651 Stiffness of right hip, not elsewhere classified: Secondary | ICD-10-CM | POA: Insufficient documentation

## 2015-10-27 DIAGNOSIS — R2689 Other abnormalities of gait and mobility: Secondary | ICD-10-CM | POA: Diagnosis present

## 2015-10-27 DIAGNOSIS — R471 Dysarthria and anarthria: Secondary | ICD-10-CM | POA: Diagnosis present

## 2015-10-27 DIAGNOSIS — R2681 Unsteadiness on feet: Secondary | ICD-10-CM | POA: Insufficient documentation

## 2015-10-27 DIAGNOSIS — R41844 Frontal lobe and executive function deficit: Secondary | ICD-10-CM | POA: Insufficient documentation

## 2015-10-27 DIAGNOSIS — M25611 Stiffness of right shoulder, not elsewhere classified: Secondary | ICD-10-CM | POA: Diagnosis present

## 2015-10-27 DIAGNOSIS — R1312 Dysphagia, oropharyngeal phase: Secondary | ICD-10-CM | POA: Insufficient documentation

## 2015-10-27 DIAGNOSIS — M6281 Muscle weakness (generalized): Secondary | ICD-10-CM | POA: Diagnosis present

## 2015-10-27 DIAGNOSIS — R278 Other lack of coordination: Secondary | ICD-10-CM | POA: Diagnosis present

## 2015-10-27 DIAGNOSIS — M25652 Stiffness of left hip, not elsewhere classified: Secondary | ICD-10-CM | POA: Diagnosis present

## 2015-10-27 NOTE — Therapy (Signed)
[facility] [address] [place], [place], [address] Phone: [phone]   Fax:  [phone]  Speech Language Pathology Treatment  Patient Details  Name: Judy Spears MRN: [id number] Date of Birth: 1950/09/09 Referring Provider: Dr. Alger Simons  Encounter Date: 10/27/2015      End of Session - 10/27/15 1630    Visit Number 15   Number of Visits 24   Date for SLP Re-Evaluation 11/18/15   SLP Start Time 6294   SLP Stop Time  1620   SLP Time Calculation (min) 45 min   Activity Tolerance Patient tolerated treatment well      Past Medical History:  Diagnosis Date  . Arthritis    lt great toe  . Complication of anesthesia    pt has had headaches post op-did advise to hydra well preop  . Hair loss    right-sided  . History of exercise stress test    a. ETT (12/15) with 12:00 exercise, no ST changes, occasional PACs.   . Hyperlipidemia   . Insomnia   . Migraine headache   . 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 Postmenopausal   . Premature atrial contractions    a. Holter (12/15) with 8% PACs, no atrial runs or atrial fibrillation.     Past Surgical History:  Procedure Laterality Date  . BREAST BIOPSY  01/01/2011   Procedure: BREAST BIOPSY WITH NEEDLE LOCALIZATION;  Surgeon: Rolm Bookbinder, MD;  Location: [facility];  Service: General;  Laterality: Left;  left breast wire localization biopsy  . cataracts right eye    . CESAREAN SECTION  1986  . HERNIA REPAIR  2000   RIH  . opirectinal membrane peel    . RHINOPLASTY  1983    There were no vitals filed for this visit.             ADULT SLP TREATMENT - 10/27/15 1623      General Information   Behavior/Cognition Alert;Pleasant mood;Distractible;Requires cueing   Patient Positioning Upright in chair   Oral care provided Yes     Treatment Provided   Treatment provided Dysphagia;Cognitive-Linquistic     Dysphagia  Treatment   Temperature Spikes Noted No   Respiratory Status Room air   Oral Cavity - Dentition Adequate natural dentition   Treatment Methods Skilled observation;Upgraded PO texture trial;Compensation strategy training;Patient/caregiver education   Patient observed directly with PO's Yes   Type of PO's observed Thin liquids;Ice chips;Dysphagia 1 (puree)   Feeding Total assist   Liquids provided via Teaspoon   Oral Phase Signs & Symptoms Anterior loss/spillage;Oral holding   Pharyngeal Phase Signs & Symptoms Suspected delayed swallow initiation   Type of cueing Verbal;Tactile;Visual   Amount of cueing Maximal   Other treatment/comments Again utilized count down, "1- 2- 3- swallow" with improved success with water/ice. Swallow response significantly slower for thinned purees- requiring multiple liquid washed to clear oral cavity.     Pain Assessment   Pain Assessment Faces   Faces Pain Scale No hurt     Cognitive-Linquistic Treatment   Treatment focused on Voice;Dysarthria;Patient/family/caregiver education   Skilled Treatment Pt acheived voicing in approximately 50% of attempts with social speech and automatic speech. Strategy of speaking in unison was benefical to increase use of voicing. Pt with frequent non-speech vocalizations during the session which her husband reports is typical behavior for the morning, but is not typically present this time of day.  Assessment / Recommendations / Plan   Plan Continue with current plan of care     Progression Toward Goals   Progression toward goals Progressing toward goals            SLP Short Term Goals - 10/27/15 1643      SLP SHORT TERM GOAL #1   Title Pt will demonstrate audible phonation with prolonged vowels and cv syllables 12/15 trials with occasional min A   Time 1   Period Weeks   Status On-going     SLP SHORT TERM GOAL #2   Title Pt will demonstrate breath support prior to repetition of words 8/10x   Time 1   Period  Weeks   Status Not Met     SLP SHORT TERM GOAL #3   Title Pt will swallow ice chip/H20 via spoon in less than 7 seconds with usual min to mod A 3/5 trials   Status On-going     SLP SHORT TERM GOAL #4   Title Pt will sustain attention to therapy task for over 7 minutes with occasional min A   Status On-going          SLP Long Term Goals - 10/27/15 1644      SLP LONG TERM GOAL #1   Title Pt will demonstrate audible phonation/intelligible speech with 2 word phrases with occasional min A 5/7 utterances   Time 4   Period Weeks   Status On-going     SLP LONG TERM GOAL #2   Title Pt will demonstrate audible cough/throat clear to command and to clear secretions with occasional min A 4/5 trials.    Status On-going     SLP LONG TERM GOAL #3   Title Pt will achieve timely oral phase and swallow with ice chip/ water trials 5/7 trials   Status On-going     SLP LONG TERM GOAL #4   Title Pt will sustain attention to therapy tasks for 12 minutes with occasiona min A   Status On-going          Plan - 10/27/15 1642    Clinical Impression Statement Pt with improved alertness this date. Focus continues on speech and swallowing.   Speech Therapy Frequency 3x / week   Duration 4 weeks   Treatment/Interventions Aspiration precaution training;Trials of upgraded texture/liquids;Compensatory strategies;Functional tasks;Patient/family education;Cueing hierarchy;Diet toleration management by SLP;Cognitive reorganization;Compensatory techniques;SLP instruction and feedback;Internal/external aids;Multimodal communcation approach   Potential to Achieve Goals Fair   Potential Considerations Ability to learn/carryover information;Co-morbidities;Severity of impairments   Consulted and Agree with Plan of Care Family member/caregiver   Family Member Consulted [name]- spouse      Patient will benefit from skilled therapeutic intervention in order to improve the following deficits and impairments:    Dysphagia, oropharyngeal phase  Dysarthria and anarthria    Problem List Patient Active Problem List   Diagnosis Date Noted  . 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[place]) 10/20/2015  . Tracheostomy status ([place]) 09/26/2015  . Allergic rhinitis 09/26/2015  . 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 Movement disorder 09/22/2015  . Encephalitis 09/22/2015  . Chest pain 02/07/2014  . Premature atrial contractions 01/13/2014  . Abnormality of gait 12/04/2013  . Hyperlipidemia 12/21/2011  . Cardiovascular risk factor 12/21/2011  . Bunion 01/31/2008  . Metatarsalgia of both feet 09/20/2007  . FLAT FOOT 09/20/2007  . HALLUX RIGIDUS, ACQUIRED 09/20/2007    Vinetta Bergamo MA, CCC-SLP 10/27/2015, 4:46 PM  [facility] [address] [place], [place],  [address] Phone: [phone]   Fax:  [phone]   Name: GAGE TREIBER MRN: [id number] Date of Birth: [birth year]

## 2015-10-27 NOTE — Telephone Encounter (Signed)
Spoke to Mr. Judy Spears. Stop ritalin/resume amantadine. Keep klonopin the same for now. Check urine specimen at primary's office tomorrow

## 2015-10-27 NOTE — Telephone Encounter (Signed)
[name] Patierno needs a call back, he thinks his wife is having an adverse reaction to change of medication Ritalin.  Patient was stiff as a board and they had to put a catheter in her.  Please call him at [phone].

## 2015-10-28 ENCOUNTER — Encounter: Payer: Self-pay | Admitting: Occupational Therapy

## 2015-10-28 ENCOUNTER — Ambulatory Visit: Payer: BC Managed Care – PPO | Admitting: Occupational Therapy

## 2015-10-28 ENCOUNTER — Ambulatory Visit: Payer: BC Managed Care – PPO | Admitting: Physical Therapy

## 2015-10-28 ENCOUNTER — Telehealth: Payer: Self-pay | Admitting: Pulmonary Disease

## 2015-10-28 DIAGNOSIS — M25611 Stiffness of right shoulder, not elsewhere classified: Secondary | ICD-10-CM

## 2015-10-28 DIAGNOSIS — M6281 Muscle weakness (generalized): Secondary | ICD-10-CM

## 2015-10-28 DIAGNOSIS — R1312 Dysphagia, oropharyngeal phase: Secondary | ICD-10-CM | POA: Diagnosis not present

## 2015-10-28 DIAGNOSIS — R41844 Frontal lobe and executive function deficit: Secondary | ICD-10-CM

## 2015-10-28 DIAGNOSIS — M25622 Stiffness of left elbow, not elsewhere classified: Secondary | ICD-10-CM

## 2015-10-28 DIAGNOSIS — M25652 Stiffness of left hip, not elsewhere classified: Secondary | ICD-10-CM

## 2015-10-28 DIAGNOSIS — R278 Other lack of coordination: Secondary | ICD-10-CM

## 2015-10-28 DIAGNOSIS — R2689 Other abnormalities of gait and mobility: Secondary | ICD-10-CM

## 2015-10-28 DIAGNOSIS — M25612 Stiffness of left shoulder, not elsewhere classified: Secondary | ICD-10-CM

## 2015-10-28 DIAGNOSIS — M25651 Stiffness of right hip, not elsewhere classified: Secondary | ICD-10-CM

## 2015-10-28 NOTE — Therapy (Signed)
[facility] [address] [place], [place], [address] Phone: [phone]   Fax:  [phone]  Physical Therapy Treatment  Patient Details  Name: Judy Spears MRN: [id number] Date of Birth: [birth year] Referring Provider: Faith Rogue, MD Mitchell Heir, MD)  Encounter Date: 10/28/2015      PT End of Session - 10/28/15 1305    Visit Number 15   Number of Visits 25   Date for PT Re-Evaluation 11/22/15   Authorization Type BCBS   PT Start Time 1101   PT Stop Time 1145   PT Time Calculation (min) 44 min   Activity Tolerance Patient limited by pain   Behavior During Therapy [facility] for tasks assessed/performed      Past Medical History:  Diagnosis Date  . Arthritis    lt great toe  . Complication of anesthesia    pt has had headaches post op-did advise to hydra well preop  . Hair loss    right-sided  . History of exercise stress test    a. ETT (12/15) with 12:00 exercise, no ST changes, occasional PACs.   . Hyperlipidemia   . Insomnia   . Migraine headache   . 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 Postmenopausal   . Premature atrial contractions    a. Holter (12/15) with 8% PACs, no atrial runs or atrial fibrillation.     Past Surgical History:  Procedure Laterality Date  . BREAST BIOPSY  01/01/2011   Procedure: BREAST BIOPSY WITH NEEDLE LOCALIZATION;  Surgeon: Emelia Loron, MD;  Location: [facility];  Service: General;  Laterality: Left;  left breast wire localization biopsy  . cataracts right eye    . CESAREAN SECTION  1986  . HERNIA REPAIR  2000   RIH  . opirectinal membrane peel    . RHINOPLASTY  1983    There were no vitals filed for this visit.      Subjective Assessment - 10/28/15 1304    Subjective Per husband, "Today is a better day. They stopped the Ritalin."   Patient is accompained by: Family member  husband, [name] and caregiver, Misty Stanley   Pertinent History  Precautions: trach (capped), PEG, seizure. 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 Yes  Overt signs of discomfort when hip/knee extension attempted; pt unable to verbalize       NMR: Level squat pivot from w/c <> mat table with max - total A (+2 for safety) with multimodal cueing, increased time for full anterior weight shift. Max cueing for LE activation; however, pt with increased B hip/knee flexion due to hypertonicity. Pt required max A for seated A/P scooting with BLE's supported. During static sitting, noted pt in L lateral flexion. When asked it pt felt midline, pt nodded head "yes". When mirror placed anterior to pt for visual feedback, pt with increased awareness of postural deviation but required mod A to self-correct. Blocked practice of sit <> partial stand with BUE support at standard chair,  B anterior tibias blocked by chair seat (stabilized by PT). Pt required max manual facilitation (counterforce at posterior thighs, anterior tibias) to achieve 75% of full standing (which was limited by increased hamstring tone. Attempted  static standing with total A of standing frame x2 attempts to manage tone in B hamstrings; however, pt with overt signs of discomfort during final 50% of B hip/knee extension.                              PT Short Term Goals - 10/23/15 1624      PT SHORT TERM GOAL #1   Title Pt will initiate anterior weight shift sufficient to unweight hips with supervision, 25% cueing in preparation for functional transfer.  (Target date: 10/21/15)   Baseline Can initiate with min A inconsistently with 50% cuing.     Status Partially Met     PT SHORT TERM GOAL #2    Title Pt will perform seated scooting (AP/PA and M/L) with supervision, 25% cueing to increase independence with transfers, repositioning. (10/21/15)   Status On-going     PT SHORT TERM GOAL #3   Title Pt will tolerate static standing with total A of standing frame x10 minutes with vital signs WNL to improve standing tolerance and for tone management.  (10/21/15)   Baseline met    Status Achieved     PT SHORT TERM GOAL #4   Title Pt will perform dynamic sitting >/= 5 minutes with BLE's supported, intermittent UE support with CGA to indicate improved postural stability/control necessary for functional transfers. (10/21/15)   Status On-going     PT SHORT TERM GOAL #5   Title Pt will perform sit <> side lying with mod A, 50% cueing to progress toward increased independence with bed mobility.  (10/21/15)   Baseline 9/22: R sidelying > sit with mod A.    Status Achieved     PT SHORT TERM GOAL #6   Title Pt will consistently perform sit <> stand with min A, 25% cueing to indicate increased independence with functional transfers.  (10/21/15)   Status On-going     PT SHORT TERM GOAL #7   Title Pt will perform level transfer from w/c <> mat table with mod A, 50% cueing to decrease caregiver burden with functional transfers.  (10/21/15)   Baseline mod/max A on 10/23/15, max A cues for sequencing and hand placement.    Status On-going           PT Long Term Goals - 09/23/15 2120      PT LONG TERM GOAL #1   Title Pt will perform supine > sit with mod A, 50% cueing to indicate increased independence with getting out of bed.  (Target: 11/18/15)     PT LONG TERM GOAL #2   Title Pt will perform sit > supine with mod A, 50% cueing to indicate increased independence with getting into bed.  (11/18/15)     PT LONG TERM GOAL #3   Title Pt will perform level transfer from w/c <> mat table with min A, 25% cueing to decrease caregiver burden with functional transfers.  (11/18/15)     PT LONG TERM GOAL #4    Title Pt will perform dynamic standing >/= 2 consecutive minutes with BUE support and mod, 50% cueing to indicate improved postural stanbility/control.   (11/18/15)     PT LONG TERM GOAL #5   Title Patient will ambulate x15' with max A of spouse using LRAD to enable pt/spouse to safely walk into/out of home bathroom.  (11/18/15)               Plan -  10/28/15 1306    Clinical Impression Statement Pt arrived to session wth increase in B hamstring tone, increased time required to answer yes/no questions, and overt signs of discomfort when standing (actively and via standing frame) attempted. Per chart, stimulant medication stopped last night, as MD unable to rule out adverse reaction to medication. Per chart, pt became "stiff as a board" and needed to be catheterized.    Rehab Potential Good   Clinical Impairments Affecting Rehab Potential Positive: strong family/social support; negative: chronicity of condition   PT Frequency 3x / week   PT Duration 8 weeks   PT Treatment/Interventions ADLs/Self Care Home Management;DME Instruction;Gait training;Stair training;Functional mobility training;Therapeutic activities;Patient/family education;Cognitive remediation;Neuromuscular re-education;Therapeutic exercise;Balance training;Orthotic Fit/Training;Wheelchair mobility training;Manual techniques;Splinting;Visual/perceptual remediation/compensation;Vestibular;Passive range of motion   PT Next Visit Plan Seated scooting (forearm propping + reciprocal scooting; A/P scooting with B forearm support on raised surface). If skilled +2A available: quadruped, gait with AFO's, physioball for pelvic mobility/dissociation.  If no skilled +2A: standing frame, partial standing with BUE support, sit <> stand, seated scooting, supine bridging to scoot, seated reaching for anterior weight shift, or bed mobility.   Consulted and Agree with Plan of Care Patient;Family member/caregiver   Family Member Consulted husband,  [name] and caregiver, [name]      Patient will benefit from skilled therapeutic intervention in order to improve the following deficits and impairments:  Abnormal gait, Decreased balance, Decreased endurance, Decreased cognition, Cardiopulmonary status limiting activity, Decreased activity tolerance, Decreased coordination, Decreased strength, Impaired flexibility, Impaired tone, Decreased mobility, Decreased skin integrity, Increased muscle spasms, Impaired vision/preception, Decreased range of motion, Impaired UE functional use, Postural dysfunction, Pain  Visit Diagnosis: Other abnormalities of gait and mobility  Muscle weakness (generalized)  Other lack of coordination  Stiffness of left hip, not elsewhere classified  Stiffness of right hip, not elsewhere classified     Problem List Patient Active Problem List   Diagnosis Date Noted  . 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[place]) 10/20/2015  . Tracheostomy status ([place]) 09/26/2015  . Allergic rhinitis 09/26/2015  . 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 Movement disorder 09/22/2015  . Encephalitis 09/22/2015  . Chest pain 02/07/2014  . Premature atrial contractions 01/13/2014  . Abnormality of gait 12/04/2013  . Hyperlipidemia 12/21/2011  . Cardiovascular risk factor 12/21/2011  . Bunion 01/31/2008  . Metatarsalgia of both feet 09/20/2007  . FLAT FOOT 09/20/2007  . HALLUX RIGIDUS, ACQUIRED 09/20/2007    Billie Ruddy, PT, [facility] [address] [place], [place], [address] Phone: [phone]   Fax:  [phone] 10/28/15, 1:10 PM  Name: Judy Spears MRN: [id number] Date of Birth: 10-09-1950

## 2015-10-28 NOTE — Therapy (Signed)
[facility] [address] [place], [place], [address] Phone: [phone]   Fax:  [phone]  Occupational Therapy Treatment  Patient Details  Name: Judy Spears MRN: [id number] Date of Birth: 03-Nov-1950 Referring Provider: Dr. Sharion Dove  Encounter Date: 10/28/2015      OT End of Session - 10/28/15 1405    Visit Number 13   Number of Visits 24   Date for OT Re-Evaluation 11/18/15   Authorization Type BCBS unlimited visits   OT Start Time 1146   OT Stop Time 1234   OT Time Calculation (min) 48 min   Activity Tolerance Patient tolerated treatment well      Past Medical History:  Diagnosis Date  . Arthritis    lt great toe  . Complication of anesthesia    pt has had headaches post op-did advise to hydra well preop  . Hair loss    right-sided  . History of exercise stress test    a. ETT (12/15) with 12:00 exercise, no ST changes, occasional PACs.   . Hyperlipidemia   . Insomnia   . Migraine headache   . 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 Postmenopausal   . Premature atrial contractions    a. Holter (12/15) with 8% PACs, no atrial runs or atrial fibrillation.     Past Surgical History:  Procedure Laterality Date  . BREAST BIOPSY  01/01/2011   Procedure: BREAST BIOPSY WITH NEEDLE LOCALIZATION;  Surgeon: Rolm Bookbinder, MD;  Location: [facility];  Service: General;  Laterality: Left;  left breast wire localization biopsy  . cataracts right eye    . CESAREAN SECTION  1986  . HERNIA REPAIR  2000   RIH  . opirectinal membrane peel    . RHINOPLASTY  1983    There were no vitals filed for this visit.      Subjective Assessment - 10/28/15 1306    Patient is accompained by: Family member  husband, caregiver   Pertinent History see epic snapshot - ABI/hypoxia from [place]   Patient Stated Goals Pt unable to state.    Currently in Pain? Yes   Pain Score --  unable to  rate   Pain Location Hip   Pain Orientation Left   Pain Descriptors / Indicators --  unable to describe   Pain Type Chronic pain   Pain Onset More than a month ago   Pain Frequency Intermittent   Aggravating Factors  stretching LLE   Pain Relieving Factors flexed position                      OT Treatments/Exercises (OP) - 10/28/15 0001      Neurological Re-education Exercises   Other Exercises 1 Pt arrived today with severely increased tone in BLE's as well as increased motor restlessness.  Pt also less responsive today with increased difficulty following simple commands and did not initiate simple one and two words answers when asked questions (pt has been consistently doing this).  Pt assisted into supine position (total A x2) and responded with increased motor restlessness and moaning vocalizations as well as severely increased tone in LE's for adduction, hip and knee flexion.  Neuro re ed to reduce tone through trunk elongation and rotation, slow rocking and slow stretching to LE's. Pt eventually able to tolerate full extension of RLE and partial extension of LLE.  Pt then assisted back to sitting and addressed  forward leaning with active trunk and weight bearing on BUE's as well as finding midline.  Progressed to facilitation of pt participation in sit to squat as well as squat pivot transfer back to wheelchar (max a x2).  Pt's tone was more relaxed with less motore restlessness by end of session. Husband reports they are on their way to primary MD for yeast infection and to rule out recurrence of bladder infection. Will notify PM&R MD as well.                   OT Short Term Goals - 10/28/15 1315      OT SHORT TERM GOAL #1   Title Pt and husband will be mod I with HEP - 10/21/2015   Status On-going     OT SHORT TERM GOAL #2   Title Pt will demonstrate adequate postural control for breath support to voice at least 3 words   Status Achieved     OT SHORT TERM  GOAL #3   Title Pt will transfer to commode with moderate assistance 75% of the time.   Status On-going     OT SHORT TERM GOAL #4   Title Pt will tolerate positional changes as evidenced by respiration rate and motor quieting in preparation for funcitonal mobility   Status Achieved           OT Long Term Goals - 10/28/15 1315      OT LONG TERM GOAL #1   Title Pt and husband will be mod I with upgraded HEP - 11/18/2015   Status On-going     OT LONG TERM GOAL #2   Title Pt will demonstrate adequate postural control to support 5 words in supported sitting.   Status On-going     OT LONG TERM GOAL #3   Title Pt will demonstrate ability for low reach with RUE for functional task with  mod facilitation   Status On-going     OT LONG TERM GOAL #4   Title Pt will wash face with LUE with min a in supported sitting   Status On-going     OT LONG TERM GOAL #5   Title Pt will release objects with L hand with min faciltation    Status On-going     OT LONG TERM GOAL #6   Title Pt will stand with min a while caregiver manages pants during LB dressing 75% of the time.   Status On-going     OT LONG TERM GOAL #7   Title Pt will transfer to commode with min a 75% of the time.   Status On-going     OT LONG TERM GOAL #8   Title Pt will be able to sit unsupported with supervision 50% of the time while engaged in functional task.   Status On-going               Plan - 10/28/15 1315    Clinical Impression Statement Pt with severely increased tone today as well as decreased responsiveness.  Rehab MD aware and husband taking pt to primary MD at end of session.    Rehab Potential Fair   Clinical Impairments Affecting Rehab Potential severity of deficits, slow rate of recovery   OT Frequency 3x / week   OT Duration 8 weeks   OT Treatment/Interventions Self-care/ADL training;Ultrasound;Moist Heat;Electrical Stimulation;DME and/or AE instruction;Neuromuscular education;Therapeutic  exercise;Functional Mobility Training;Manual Therapy;Passive range of motion;Splinting;Therapeutic activities;Balance training;Patient/family education;Visual/perceptual remediation/compensation;Cognitive remediation/compensation   Plan trunk control (active extension in kneeling  and standing), level surface transfers, sit to squat, sit to staand, weight shifting, functional use of hands   Consulted and Agree with Plan of Care Patient;Family member/caregiver   Family Member Consulted caregiver [name], husband [name]      Patient will benefit from skilled therapeutic intervention in order to improve the following deficits and impairments:  Decreased endurance, Decreased skin integrity, Impaired vision/preception, Improper body mechanics, Impaired perceived functional ability, Decreased knowledge of precautions, Cardiopulmonary status limiting activity, Decreased activity tolerance, Decreased knowledge of use of DME, Decreased strength, Impaired flexibility, Improper spinal/pelvic alignment, Impaired sensation, Difficulty walking, Decreased mobility, Decreased balance, Decreased cognition, Decreased range of motion, Impaired tone, Pain, Impaired UE functional use, Decreased safety awareness, Decreased coordination  Visit Diagnosis: Other abnormalities of gait and mobility  Muscle weakness (generalized)  Stiffness of right shoulder, not elsewhere classified  Stiffness of left shoulder, not elsewhere classified  Frontal lobe and executive function deficit  Stiffness of left elbow, not elsewhere classified    Problem List Patient Active Problem List   Diagnosis Date Noted  . 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 Tracheostomy status ([place]) 09/26/2015  . Allergic rhinitis 09/26/2015  . 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 Movement disorder 09/22/2015  . Encephalitis 09/22/2015  . Chest pain 02/07/2014  . Premature atrial contractions 01/13/2014  . Abnormality of gait 12/04/2013  .  Hyperlipidemia 12/21/2011  . Cardiovascular risk factor 12/21/2011  . Bunion 01/31/2008  . Metatarsalgia of both feet 09/20/2007  . FLAT FOOT 09/20/2007  . HALLUX RIGIDUS, ACQUIRED 09/20/2007    Quay Burow , OTR/L 10/28/2015, 2:07 PM  [facility] [address] [place], [place], [address] Phone: [phone]   Fax:  [phone]  Name: Judy Spears MRN: [id number] Date of Birth: April 19, 1950

## 2015-10-28 NOTE — Telephone Encounter (Signed)
Spoke with pt's husband. He is needing to make an appointment with the [facility]. At her last OV, BQ wanted this set up and it was not. I have given him the number to the [facility]. Nothing further was needed.

## 2015-10-29 ENCOUNTER — Ambulatory Visit: Payer: BC Managed Care – PPO | Admitting: Occupational Therapy

## 2015-10-29 ENCOUNTER — Ambulatory Visit: Payer: BC Managed Care – PPO

## 2015-10-29 ENCOUNTER — Encounter: Payer: Self-pay | Admitting: Occupational Therapy

## 2015-10-29 ENCOUNTER — Ambulatory Visit: Payer: BC Managed Care – PPO | Admitting: Physical Therapy

## 2015-10-29 DIAGNOSIS — M25622 Stiffness of left elbow, not elsewhere classified: Secondary | ICD-10-CM

## 2015-10-29 DIAGNOSIS — R41841 Cognitive communication deficit: Secondary | ICD-10-CM

## 2015-10-29 DIAGNOSIS — M25612 Stiffness of left shoulder, not elsewhere classified: Secondary | ICD-10-CM

## 2015-10-29 DIAGNOSIS — R41844 Frontal lobe and executive function deficit: Secondary | ICD-10-CM

## 2015-10-29 DIAGNOSIS — R278 Other lack of coordination: Secondary | ICD-10-CM

## 2015-10-29 DIAGNOSIS — R2689 Other abnormalities of gait and mobility: Secondary | ICD-10-CM

## 2015-10-29 DIAGNOSIS — R1312 Dysphagia, oropharyngeal phase: Secondary | ICD-10-CM | POA: Diagnosis not present

## 2015-10-29 DIAGNOSIS — R293 Abnormal posture: Secondary | ICD-10-CM

## 2015-10-29 DIAGNOSIS — R471 Dysarthria and anarthria: Secondary | ICD-10-CM

## 2015-10-29 DIAGNOSIS — M25651 Stiffness of right hip, not elsewhere classified: Secondary | ICD-10-CM

## 2015-10-29 DIAGNOSIS — M25652 Stiffness of left hip, not elsewhere classified: Secondary | ICD-10-CM

## 2015-10-29 DIAGNOSIS — M6281 Muscle weakness (generalized): Secondary | ICD-10-CM

## 2015-10-29 DIAGNOSIS — R2681 Unsteadiness on feet: Secondary | ICD-10-CM

## 2015-10-29 DIAGNOSIS — M25611 Stiffness of right shoulder, not elsewhere classified: Secondary | ICD-10-CM

## 2015-10-29 NOTE — Therapy (Signed)
[facility] [address] [place], [place], [address] Phone: [phone]   Fax:  [phone]  Occupational Therapy Treatment  Patient Details  Name: Judy Spears MRN: [id number] Date of Birth: Aug 18, 1950 Referring Provider: Dr. Sharion Dove  Encounter Date: 10/29/2015      OT End of Session - 10/29/15 1622    Visit Number 14   Number of Visits 24   Date for OT Re-Evaluation 11/18/15   Authorization Type BCBS unlimited visits   OT Start Time [id number]   OT Stop Time 1530   OT Time Calculation (min) 45 min   Activity Tolerance Patient tolerated treatment well   Behavior During Therapy [facility] for tasks assessed/performed      Past Medical History:  Diagnosis Date  . Arthritis    lt great toe  . Complication of anesthesia    pt has had headaches post op-did advise to hydra well preop  . Hair loss    right-sided  . History of exercise stress test    a. ETT (12/15) with 12:00 exercise, no ST changes, occasional PACs.   . Hyperlipidemia   . Insomnia   . Migraine headache   . 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 Postmenopausal   . Premature atrial contractions    a. Holter (12/15) with 8% PACs, no atrial runs or atrial fibrillation.     Past Surgical History:  Procedure Laterality Date  . BREAST BIOPSY  01/01/2011   Procedure: BREAST BIOPSY WITH NEEDLE LOCALIZATION;  Surgeon: Rolm Bookbinder, MD;  Location: [facility];  Service: General;  Laterality: Left;  left breast wire localization biopsy  . cataracts right eye    . CESAREAN SECTION  1986  . HERNIA REPAIR  2000   RIH  . opirectinal membrane peel    . RHINOPLASTY  1983    There were no vitals filed for this visit.      Subjective Assessment - 10/29/15 1540    Subjective  Good   Patient is accompained by: --  paid caregiver   Patient Stated Goals Pt unable to state.    Currently in Pain? No/denies   Pain Score 0-No pain                       OT Treatments/Exercises (OP) - 10/29/15 0001      Neurological Re-education Exercises   Other Exercises 1 Neuromuscular reedcuation to address mecahnics of sitting to/from standing with increased emphasis on LE activation.  Patient unable to activate legs off surface for initial push off, but when assisted to partial stand, able to extend knees and hips.  Heavy emphasis on foot placement on ground to address alighnment of hip.  Patient with extremely taught hamstring left>right.  Sit to stand at parallel bars with strap at knees to promote alignment for standing.  Patient able to stand (lacks full knee, hip, thoracici extension for up to one minute at a time.  Patient beginnign to free UE's from support in standing this afternoon.  Worked on activation in mid range of flex/ext.                    OT Short Term Goals - 10/28/15 1315      OT SHORT TERM GOAL #1   Title Pt and husband will be mod I with HEP - 10/21/2015   Status On-going     OT SHORT TERM GOAL #2  Title Pt will demonstrate adequate postural control for breath support to voice at least 3 words   Status Achieved     OT SHORT TERM GOAL #3   Title Pt will transfer to commode with moderate assistance 75% of the time.   Status On-going     OT SHORT TERM GOAL #4   Title Pt will tolerate positional changes as evidenced by respiration rate and motor quieting in preparation for funcitonal mobility   Status Achieved           OT Long Term Goals - 10/28/15 1315      OT LONG TERM GOAL #1   Title Pt and husband will be mod I with upgraded HEP - 11/18/2015   Status On-going     OT LONG TERM GOAL #2   Title Pt will demonstrate adequate postural control to support 5 words in supported sitting.   Status On-going     OT LONG TERM GOAL #3   Title Pt will demonstrate ability for low reach with RUE for functional task with  mod facilitation   Status On-going     OT LONG TERM GOAL #4    Title Pt will wash face with LUE with min a in supported sitting   Status On-going     OT LONG TERM GOAL #5   Title Pt will release objects with L hand with min faciltation    Status On-going     OT LONG TERM GOAL #6   Title Pt will stand with min a while caregiver manages pants during LB dressing 75% of the time.   Status On-going     OT LONG TERM GOAL #7   Title Pt will transfer to commode with min a 75% of the time.   Status On-going     OT LONG TERM GOAL #8   Title Pt will be able to sit unsupported with supervision 50% of the time while engaged in functional task.   Status On-going               Plan - 10/29/15 1623    Clinical Impression Statement Patient with improved resposiveness today, increased tone in LE's persists - left >right leg.     Rehab Potential Fair   Clinical Impairments Affecting Rehab Potential severity of deficits, slow rate of recovery   OT Frequency 3x / week   OT Duration 8 weeks   OT Treatment/Interventions Self-care/ADL training;Ultrasound;Moist Heat;Electrical Stimulation;DME and/or AE instruction;Neuromuscular education;Therapeutic exercise;Functional Mobility Training;Manual Therapy;Passive range of motion;Splinting;Therapeutic activities;Balance training;Patient/family education;Visual/perceptual remediation/compensation;Cognitive remediation/compensation   Plan Trunk control, LE activation, use of vision to aide UE function   Consulted and Agree with Plan of Care Patient;Family member/caregiver   Family Member Consulted caregiver [name]      Patient will benefit from skilled therapeutic intervention in order to improve the following deficits and impairments:  Decreased endurance, Decreased skin integrity, Impaired vision/preception, Improper body mechanics, Impaired perceived functional ability, Decreased knowledge of precautions, Cardiopulmonary status limiting activity, Decreased activity tolerance, Decreased knowledge of use of DME,  Decreased strength, Impaired flexibility, Improper spinal/pelvic alignment, Impaired sensation, Difficulty walking, Decreased mobility, Decreased balance, Decreased cognition, Decreased range of motion, Impaired tone, Pain, Impaired UE functional use, Decreased safety awareness, Decreased coordination  Visit Diagnosis: Muscle weakness (generalized)  Other lack of coordination  Stiffness of right shoulder, not elsewhere classified  Stiffness of left shoulder, not elsewhere classified  Frontal lobe and executive function deficit  Stiffness of left elbow, not elsewhere classified  Abnormal posture  Unsteadiness on feet    Problem List Patient Active Problem List   Diagnosis Date Noted  . 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[place]) 10/20/2015  . Tracheostomy status ([place]) 09/26/2015  . Allergic rhinitis 09/26/2015  . 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 Movement disorder 09/22/2015  . Encephalitis 09/22/2015  . Chest pain 02/07/2014  . Premature atrial contractions 01/13/2014  . Abnormality of gait 12/04/2013  . Hyperlipidemia 12/21/2011  . Cardiovascular risk factor 12/21/2011  . Bunion 01/31/2008  . Metatarsalgia of both feet 09/20/2007  . FLAT FOOT 09/20/2007  . HALLUX RIGIDUS, ACQUIRED 09/20/2007    Mariah Milling 10/29/2015, 4:26 PM  [facility] [address] [place], [place], [address] Phone: [phone]   Fax:  [phone]  Name: Judy Spears MRN: [id number] Date of Birth: [birth year]

## 2015-10-29 NOTE — Therapy (Signed)
[facility] [address] [place], [place], [address] Phone: [phone]   Fax:  [phone]  Speech Language Pathology Treatment  Patient Details  Name: Judy Spears MRN: [id number] Date of Birth: [birth year] Referring Provider: Dr. Alger Simons  Encounter Date: 10/29/2015      End of Session - 10/29/15 1657    Visit Number 16   Number of Visits 24   Date for SLP Re-Evaluation 11/18/15   SLP Start Time 1533   SLP Stop Time  1616   SLP Time Calculation (min) 43 min   Activity Tolerance Patient tolerated treatment well      Past Medical History:  Diagnosis Date  . Arthritis    lt great toe  . Complication of anesthesia    pt has had headaches post op-did advise to hydra well preop  . Hair loss    right-sided  . History of exercise stress test    a. ETT (12/15) with 12:00 exercise, no ST changes, occasional PACs.   . Hyperlipidemia   . Insomnia   . Migraine headache   . 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 Postmenopausal   . Premature atrial contractions    a. Holter (12/15) with 8% PACs, no atrial runs or atrial fibrillation.     Past Surgical History:  Procedure Laterality Date  . BREAST BIOPSY  01/01/2011   Procedure: BREAST BIOPSY WITH NEEDLE LOCALIZATION;  Surgeon: Rolm Bookbinder, MD;  Location: [facility];  Service: General;  Laterality: Left;  left breast wire localization biopsy  . cataracts right eye    . CESAREAN SECTION  1986  . HERNIA REPAIR  2000   RIH  . opirectinal membrane peel    . RHINOPLASTY  1983    There were no vitals filed for this visit.      Subjective Assessment - 10/29/15 1646    Subjective Pt attentive/alert for first 45 seconds of ST.   Patient is accompained by: Judy Spears, caregiver               ADULT SLP TREATMENT - 10/29/15 1647      General Information   Behavior/Cognition Cooperative;Pleasant mood;Lethargic;Requires  cueing;Decreased sustained attention   Patient Positioning Upright in chair     Treatment Provided   Treatment provided Dysphagia     Dysphagia Treatment   Temperature Spikes Noted No   Oral Cavity - Dentition Adequate natural dentition   Treatment Methods Skilled observation;Upgraded PO texture trial   Patient observed directly with PO's Yes   Type of PO's observed Thin liquids;Ice chips   Liquids provided via Teaspoon   Oral Phase Signs & Symptoms Anterior loss/spillage;Oral holding   Pharyngeal Phase Signs & Symptoms Suspected delayed swallow initiation;Delayed cough;Delayed throat clear   Other treatment/comments SLP used icewater and small icechips to incr pt's readiness for oral PO feeding/modified barium swallow exam. Max verbal visual and demo cues were used with pt along with cue to "1-2-3-, swallow" to achieve a swallow response from 3-16 seconds of delay 38% of the time. Pt answered "wh" questions 70% of the time, without voicing, and intelligibility at <25% due to minimal labial/oral movement. SLP utilized [employer] of pt to attempt to maintain her alertness, and this appeared to help, as pt was alert for approx 60% of session.     Pain Assessment   Pain Assessment No/denies pain  no indications of pain     Assessment /  Recommendations / Plan   Plan Continue with current plan of care     Progression Toward Goals   Progression toward goals Progressing toward goals            SLP Short Term Goals - 10/29/15 1703      SLP SHORT TERM GOAL #1   Title Pt will demonstrate audible phonation with prolonged vowels and cv syllables 12/15 trials with occasional min A   Time 1   Period Weeks   Status On-going     SLP SHORT TERM GOAL #2   Title Pt will demonstrate breath support prior to repetition of words 8/10x   Time 1   Period Weeks   Status On-going     SLP SHORT TERM GOAL #3   Title Pt will swallow ice chip/H20 via spoon in less than 7 seconds with usual min  to mod A 3/5 trials   Status On-going     SLP SHORT TERM GOAL #4   Title Pt will sustain attention to therapy task for over 7 minutes with occasional min A   Status On-going          SLP Long Term Goals - 10/29/15 1703      SLP LONG TERM GOAL #1   Title Pt will demonstrate audible phonation/intelligible speech with 2 word phrases with occasional min A 5/7 utterances   Time 3   Period Weeks   Status On-going     SLP LONG TERM GOAL #2   Title Pt will demonstrate audible cough/throat clear to command and to clear secretions with occasional min A 4/5 trials.    Time 3   Period Weeks   Status On-going     SLP LONG TERM GOAL #3   Title Pt will achieve timely oral phase and swallow with ice chip/ water trials 5/7 trials   Time 3   Period Weeks   Status On-going     SLP LONG TERM GOAL #4   Title Pt will sustain attention to therapy tasks for 12 minutes with occasiona min A   Time 3   Period Weeks   Status On-going          Plan - 10/29/15 1658    Clinical Impression Statement Pt's alertness improved over the last session this SLP saw pt, possibly due to SLP playing YouTube video of pt. Frequency of swallow was greater today, and pt continues need to move towards readiness for modified barium swallow exam (MBSS).   Speech Therapy Frequency 3x / week   Duration --  3 weeks   Treatment/Interventions Aspiration precaution training;Trials of upgraded texture/liquids;Compensatory strategies;Functional tasks;Patient/family education;Cueing hierarchy;Diet toleration management by SLP;Cognitive reorganization;Compensatory techniques;SLP instruction and feedback;Internal/external aids;Multimodal communcation approach   Potential to Achieve Goals Fair   Potential Considerations Ability to learn/carryover information;Co-morbidities;Severity of impairments      Patient will benefit from skilled therapeutic intervention in order to improve the following deficits and impairments:    Dysphagia, oropharyngeal phase  Dysarthria and anarthria  Cognitive communication deficit    Problem List Patient Active Problem List   Diagnosis Date Noted  . 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[place]) 10/20/2015  . Tracheostomy status ([place]) 09/26/2015  . Allergic rhinitis 09/26/2015  . 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 Movement disorder 09/22/2015  . Encephalitis 09/22/2015  . Chest pain 02/07/2014  . Premature atrial contractions 01/13/2014  . Abnormality of gait 12/04/2013  . Hyperlipidemia 12/21/2011  . Cardiovascular risk factor 12/21/2011  . Bunion 01/31/2008  . Metatarsalgia of both feet  09/20/2007  . FLAT FOOT 09/20/2007  . HALLUX RIGIDUS, ACQUIRED 09/20/2007    [facility] ,[place], CCC-SLP  10/29/2015, 5:05 PM  [facility] [address] [place], [place], [address] Phone: [phone]   Fax:  [phone]   Name: Judy Spears MRN: [id number] Date of Birth: 1950/03/01

## 2015-10-29 NOTE — Therapy (Signed)
[facility] [address] [place], [place], [address] Phone: [phone]   Fax:  [phone]  Physical Therapy Treatment  Patient Details  Name: Judy Spears MRN: [id number] Date of Birth: 15-Jul-1950 Referring Provider: Alger Simons, MD Sharion Dove, MD)  Encounter Date: 10/29/2015      PT End of Session - 10/29/15 2055    Visit Number 16   Number of Visits 25   Date for PT Re-Evaluation 11/22/15   Authorization Type BCBS   PT Start Time 1401   PT Stop Time 1445   PT Time Calculation (min) 44 min   Activity Tolerance Patient tolerated treatment well   Behavior During Therapy [facility] for tasks assessed/performed      Past Medical History:  Diagnosis Date  . Arthritis    lt great toe  . Complication of anesthesia    pt has had headaches post op-did advise to hydra well preop  . Hair loss    right-sided  . History of exercise stress test    a. ETT (12/15) with 12:00 exercise, no ST changes, occasional PACs.   . Hyperlipidemia   . Insomnia   . Migraine headache   . 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 Postmenopausal   . Premature atrial contractions    a. Holter (12/15) with 8% PACs, no atrial runs or atrial fibrillation.     Past Surgical History:  Procedure Laterality Date  . BREAST BIOPSY  01/01/2011   Procedure: BREAST BIOPSY WITH NEEDLE LOCALIZATION;  Surgeon: Rolm Bookbinder, MD;  Location: [facility];  Service: General;  Laterality: Left;  left breast wire localization biopsy  . cataracts right eye    . CESAREAN SECTION  1986  . HERNIA REPAIR  2000   RIH  . opirectinal membrane peel    . RHINOPLASTY  1983    There were no vitals filed for this visit.      Subjective Assessment - 10/29/15 2040    Subjective Pt arrived accompanied by caregiver, [name]. Pt's eyes less red and pt more interactive today, as compared with previous session.    Patient is accompained by: --   caregiver, [name]   Pertinent History Precautions: trach (capped), PEG, seizure. 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 No/denies      Donned B custom articulated AFO's, shoes prior to initiating session.   NMR: Performed squat pivot transfer from w/c <> mat table with mod to max A, 50% cueing for full anterior weight shift. Once seated EOM, pt performed blocked practice of sit <> partial stand progressing to sit <> stand from elevated EOM with BUE support of caregiver anterior to pt for increased cervical/thoracic spine extension, upright posture. PT and OT positioned on either side of pt providing manual stabilization of L foot to prevent excessive hip ER/adduction, manual blocking of anterior aspect of of B tibias, and counterforce at posterior thighs. Multimodal cueing emphasized LE activation (especially full hip/knee extension). Pt performed approx. 50% of sit > partial stand and approx. 25-30% of full sit > stand. Therapists x2 utilized to maximize cueing to facilitate more normalized/midine postural alignment to promote LE muscle activation.  PT Short Term Goals - 10/29/15 2042      PT SHORT TERM GOAL #1   Title Pt will initiate anterior weight shift sufficient to unweight hips with supervision, 25% cueing in preparation for functional transfer.  (Target date: 10/21/15)   Baseline Can initiate with min A inconsistently with 50% cuing.     Status Partially Met     PT SHORT TERM GOAL #2   Title Pt will perform seated scooting (AP/PA and M/L) with supervision, 25% cueing to increase independence with transfers, repositioning. (10/21/15)   Baseline 10/4:  Requires min-mod A, 25-50% cueing from lowest mat table height   Status Partially Met     PT SHORT TERM GOAL #3   Title Pt will tolerate static standing with total A of standing frame x10 minutes with vital signs WNL to improve standing tolerance and for tone management.  (10/21/15)   Baseline met    Status Achieved     PT SHORT TERM GOAL #4   Title Pt will perform dynamic sitting >/= 5 minutes with BLE's supported, intermittent UE support with CGA to indicate improved postural stability/control necessary for functional transfers. (10/21/15)   Status Achieved     PT SHORT TERM GOAL #5   Title Pt will perform sit <> side lying with mod A, 50% cueing to progress toward increased independence with bed mobility.  (10/21/15)   Baseline 9/22: R sidelying > sit with mod A.    Status Achieved     PT SHORT TERM GOAL #6   Title Pt will consistently perform sit <> stand with min A, 25% cueing to indicate increased independence with functional transfers.  (10/21/15)   Baseline 10/4: Continues to require mod-max A    Status Not Met     PT SHORT TERM GOAL #7   Title Pt will perform level transfer from w/c <> mat table with mod A, 50% cueing to decrease caregiver burden with functional transfers.  (10/21/15)   Baseline mod/max A on 10/23/15, max A cues for sequencing and hand placement.    Status Partially Met           PT Long Term Goals - 09/23/15 2120      PT LONG TERM GOAL #1   Title Pt will perform supine > sit with mod A, 50% cueing to indicate increased independence with getting out of bed.  (Target: 11/18/15)     PT LONG TERM GOAL #2   Title Pt will perform sit > supine with mod A, 50% cueing to indicate increased independence with getting into bed.  (11/18/15)     PT LONG TERM GOAL #3   Title Pt will perform level transfer from w/c <> mat table with min A, 25% cueing to decrease caregiver burden with functional transfers.  (11/18/15)     PT LONG TERM GOAL #4   Title Pt will perform  dynamic standing >/= 2 consecutive minutes with BUE support and mod, 50% cueing to indicate improved postural stanbility/control.   (11/18/15)     PT LONG TERM GOAL #5   Title Patient will ambulate x15' with max A of spouse using LRAD to enable pt/spouse to safely walk into/out of home bathroom.  (11/18/15)               Plan - 10/29/15 2056    Clinical Impression Statement Noted improved awareness, less hamstring tone, and no overt signs of discomfort during this session. Pt tolerated interventions well. Pt met or partially met  6 of 7 STG's, suggesting increased postural stability/control and improvements in stability/independence with functional mobility.    Rehab Potential Good   Clinical Impairments Affecting Rehab Potential Positive: strong family/social support; negative: chronicity of condition   PT Frequency 3x / week   PT Duration 8 weeks   PT Treatment/Interventions ADLs/Self Care Home Management;DME Instruction;Gait training;Stair training;Functional mobility training;Therapeutic activities;Patient/family education;Cognitive remediation;Neuromuscular re-education;Therapeutic exercise;Balance training;Orthotic Fit/Training;Wheelchair mobility training;Manual techniques;Splinting;Visual/perceptual remediation/compensation;Vestibular;Passive range of motion   PT Next Visit Plan Seated scooting, P/A of L femur while pt in standing frame (if pt tolerates).  If skilled +2A available: quadruped, gait with AFO's, physioball for pelvic mobility/dissociation.  If no skilled +2A: standing frame, partial standing with BUE support, sit <> stand, seated scooting, supine bridging to scoot, seated reaching for anterior weight shift, or bed mobility.   Consulted and Agree with Plan of Care Patient;Family member/caregiver   Family Member Consulted caregiver, [name]      Patient will benefit from skilled therapeutic intervention in order to improve the following deficits and impairments:  Abnormal  gait, Decreased balance, Decreased endurance, Decreased cognition, Cardiopulmonary status limiting activity, Decreased activity tolerance, Decreased coordination, Decreased strength, Impaired flexibility, Impaired tone, Decreased mobility, Decreased skin integrity, Increased muscle spasms, Impaired vision/preception, Decreased range of motion, Impaired UE functional use, Postural dysfunction, Pain  Visit Diagnosis: Other abnormalities of gait and mobility  Stiffness of left hip, not elsewhere classified  Stiffness of right hip, not elsewhere classified  Other lack of coordination  Stiffness of left elbow, not elsewhere classified  Unsteadiness on feet     Problem List Patient Active Problem List   Diagnosis Date Noted  . 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[place]) 10/20/2015  . Tracheostomy status ([place]) 09/26/2015  . Allergic rhinitis 09/26/2015  . 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 Movement disorder 09/22/2015  . Encephalitis 09/22/2015  . Chest pain 02/07/2014  . Premature atrial contractions 01/13/2014  . Abnormality of gait 12/04/2013  . Hyperlipidemia 12/21/2011  . Cardiovascular risk factor 12/21/2011  . Bunion 01/31/2008  . Metatarsalgia of both feet 09/20/2007  . FLAT FOOT 09/20/2007  . HALLUX RIGIDUS, ACQUIRED 09/20/2007    Billie Ruddy, PT, DPT [facility] [address] [place], [place], [address] Phone: [phone]   Fax:  [phone] 10/29/15, 9:00 PM  Name: Judy Spears MRN: [id number] Date of Birth: Mar 08, 1950

## 2015-10-31 ENCOUNTER — Ambulatory Visit: Payer: BC Managed Care – PPO

## 2015-10-31 ENCOUNTER — Ambulatory Visit: Payer: BC Managed Care – PPO | Admitting: Occupational Therapy

## 2015-10-31 ENCOUNTER — Encounter: Payer: Self-pay | Admitting: Occupational Therapy

## 2015-10-31 ENCOUNTER — Ambulatory Visit: Payer: BC Managed Care – PPO | Admitting: Physical Therapy

## 2015-10-31 DIAGNOSIS — R471 Dysarthria and anarthria: Secondary | ICD-10-CM

## 2015-10-31 DIAGNOSIS — R41841 Cognitive communication deficit: Secondary | ICD-10-CM

## 2015-10-31 DIAGNOSIS — R2689 Other abnormalities of gait and mobility: Secondary | ICD-10-CM

## 2015-10-31 DIAGNOSIS — M25652 Stiffness of left hip, not elsewhere classified: Secondary | ICD-10-CM

## 2015-10-31 DIAGNOSIS — R278 Other lack of coordination: Secondary | ICD-10-CM

## 2015-10-31 DIAGNOSIS — R41844 Frontal lobe and executive function deficit: Secondary | ICD-10-CM

## 2015-10-31 DIAGNOSIS — R1312 Dysphagia, oropharyngeal phase: Secondary | ICD-10-CM

## 2015-10-31 DIAGNOSIS — R293 Abnormal posture: Secondary | ICD-10-CM

## 2015-10-31 DIAGNOSIS — M25612 Stiffness of left shoulder, not elsewhere classified: Secondary | ICD-10-CM

## 2015-10-31 DIAGNOSIS — R2681 Unsteadiness on feet: Secondary | ICD-10-CM

## 2015-10-31 DIAGNOSIS — M25611 Stiffness of right shoulder, not elsewhere classified: Secondary | ICD-10-CM

## 2015-10-31 DIAGNOSIS — M6281 Muscle weakness (generalized): Secondary | ICD-10-CM

## 2015-10-31 DIAGNOSIS — M25622 Stiffness of left elbow, not elsewhere classified: Secondary | ICD-10-CM

## 2015-10-31 NOTE — Therapy (Signed)
[facility] [address] [place], [place], [address] Phone: [phone]   Fax:  [phone]  Speech Language Pathology Treatment  Patient Details  Name: Judy Spears MRN: [id number] Date of Birth: 1950-07-01 Referring Provider: Dr. Alger Simons  Encounter Date: 10/31/2015      End of Session - 10/31/15 1708    Visit Number 17   Number of Visits 24   Date for SLP Re-Evaluation 11/18/15   SLP Start Time 1500  pt in restroom getting meds/H2O flush/toileting from 1445-1500   SLP Stop Time  1531   SLP Time Calculation (min) 31 min   Activity Tolerance Patient tolerated treatment well      Past Medical History:  Diagnosis Date  . Arthritis    lt great toe  . Complication of anesthesia    pt has had headaches post op-did advise to hydra well preop  . Hair loss    right-sided  . History of exercise stress test    a. ETT (12/15) with 12:00 exercise, no ST changes, occasional PACs.   . Hyperlipidemia   . Insomnia   . Migraine headache   . 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 Postmenopausal   . Premature atrial contractions    a. Holter (12/15) with 8% PACs, no atrial runs or atrial fibrillation.     Past Surgical History:  Procedure Laterality Date  . BREAST BIOPSY  01/01/2011   Procedure: BREAST BIOPSY WITH NEEDLE LOCALIZATION;  Surgeon: Rolm Bookbinder, MD;  Location: [facility];  Service: General;  Laterality: Left;  left breast wire localization biopsy  . cataracts right eye    . CESAREAN SECTION  1986  . HERNIA REPAIR  2000   RIH  . opirectinal membrane peel    . RHINOPLASTY  1983    There were no vitals filed for this visit.             ADULT SLP TREATMENT - 10/31/15 1642      General Information   Behavior/Cognition Cooperative;Pleasant mood;Lethargic;Requires cueing;Decreased sustained attention;Other (comment)  somnolent     Treatment Provided   Treatment  provided Dysphagia;Cognitive-Linquistic     Dysphagia Treatment   Temperature Spikes Noted No   Respiratory Status Room air   Oral Cavity - Dentition Adequate natural dentition   Treatment Methods Skilled observation;Upgraded PO texture trial   Patient observed directly with PO's Yes   Type of PO's observed Dysphagia 1 (puree);Thin liquids   Other treatment/comments Pt did not have oral care prior to POs but family gave ok to try water via teaspoon after their informing SLP that pt had 5 teaspoons of water earlier and swallowed. With <3/4 teaspoon presentations, lt anterior labial leakage was observed with 80% of boluses. Swallow responses occurred 55% (9/16) of the time, one of which cleared oral residue after initial swallow and SLP using verbal cue of "1,2,3, swallow." Pt with usual wet voice otherwise. A 1/2 teaspoon of puree pears (baby food) was trialed with pt with poor oral control, lt labial leakage and without swallow response until SLP provided cold spoon using strong tongue blade depression upon removal of spoon from oral cavity in order to elicit swallow response.      Cognitive-Linquistic Treatment   Treatment focused on Voice;Dysarthria;Patient/family/caregiver education   Skilled Treatment Breath support for speech and pt voicing were targeted today. Pt's voice was heard in 25% of repetition tasks, in which hand over hand assist was  used to raise pt's arms in order to encourage breath support, as well as demo cues from SLP for breath support and voicing. Pt's verbal production was with minimal/insufficient labial movement 80% of the time so that very little of pt's message could be discerned, even from family/caregiver. SLP encouraged pt to open mouth and "talk big" during these times. SLP educated husband re: raising arms for opening ribcage to encourage breath support, and as visual/tactile cue for using her voice/talking.     Assessment / Recommendations / Plan   Plan Continue with  current plan of care     Dysphagia Recommendations   Diet recommendations NPO     Progression Toward Goals   Progression toward goals Not progressing toward goals (comment)  pt shows motivation when alert          SLP Education - 10/31/15 1707    Education provided Yes   Education Details using raising of arms for encouraging breath support for speech   Person(s) Educated Spouse;Caregiver(s)   Methods Explanation;Demonstration   Comprehension Verbalized understanding;Returned demonstration          SLP Short Term Goals - 10/31/15 1713      SLP SHORT TERM GOAL #1   Title Pt will demonstrate audible phonation with words, vowels, or syllables in 4/10 trials with max A   Time 1   Period Weeks   Status Revised     SLP SHORT TERM GOAL #2   Title Pt will demonstrate breath support with max A, prior to repetition of words 4/10   Time 1   Period Weeks   Status Revised     SLP SHORT TERM GOAL #3   Title Pt will swallow ice chip/H20 via spoon in less than 7 seconds with usual min to mod A 3/5 trials   Time 1   Period Weeks   Status On-going     SLP SHORT TERM GOAL #4   Title Pt will sustain attention to therapy task for over 7 minutes with occasional min A   Time 1   Period Weeks   Status On-going          SLP Long Term Goals - 10/31/15 1715      SLP LONG TERM GOAL #1   Title Pt will demonstrate audible phonation/intelligible speech with 2 word phrases with occasional min A 5/7 utterances   Time 3   Period Weeks   Status On-going     SLP LONG TERM GOAL #2   Title Pt will demonstrate audible cough/throat clear to command and to clear secretions with occasional min A 4/5 trials.    Time 3   Period Weeks   Status On-going     SLP LONG TERM GOAL #3   Title Pt will achieve timely oral phase and swallow with ice chip/ water trials 5/7 trials   Time 3   Period Weeks   Status On-going     SLP LONG TERM GOAL #4   Title Pt will sustain attention to therapy tasks  for 12 minutes with occasiona min A   Time 3   Period Weeks   Status On-going          Plan - 10/31/15 1709    Clinical Impression Statement Pt initially alert, but required rearousal via auditory and tactile means by SLP, caregiver, and spouse to maintain alertness. Successful approx 40% of the time. SLP provided icewater to pt, and a minimal amount of pureed pears. SLP engaged pt in deep  breathing/breath support cues for speech. Skilled ST may cont to bring pt benefit for swallowing as well as vocalization however putting ST on hold may want to be considered due to pt's diffiuclty with remaining alert for average of approx. 20 minutes total during ST sessions.   Speech Therapy Frequency 3x / week   Duration --  3 weeks or 9 visits   Treatment/Interventions Aspiration precaution training;Trials of upgraded texture/liquids;Compensatory strategies;Functional tasks;Patient/family education;Cueing hierarchy;Diet toleration management by SLP;Cognitive reorganization;Compensatory techniques;SLP instruction and feedback;Internal/external aids;Multimodal communcation approach   Potential to Achieve Goals Fair   Potential Considerations Ability to learn/carryover information;Co-morbidities;Severity of impairments   Consulted and Agree with Plan of Care Family member/caregiver   Family Member Consulted [name]- spouse      Patient will benefit from skilled therapeutic intervention in order to improve the following deficits and impairments:   Dysphagia, oropharyngeal phase  Dysarthria and anarthria  Cognitive communication deficit    Problem List Patient Active Problem List   Diagnosis Date Noted  . 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[place]) 10/20/2015  . Tracheostomy status ([place]) 09/26/2015  . Allergic rhinitis 09/26/2015  . 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 Movement disorder 09/22/2015  . Encephalitis 09/22/2015  . Chest pain 02/07/2014  . Premature atrial contractions 01/13/2014  . Abnormality of gait  12/04/2013  . Hyperlipidemia 12/21/2011  . Cardiovascular risk factor 12/21/2011  . Bunion 01/31/2008  . Metatarsalgia of both feet 09/20/2007  . FLAT FOOT 09/20/2007  . HALLUX RIGIDUS, ACQUIRED 09/20/2007    [facility] ,[place], CCC-SLP  10/31/2015, 5:16 PM  [facility] [address] [place], [place], [address] Phone: [phone]   Fax:  [phone]   Name: Judy Spears MRN: [id number] Date of Birth: [birth year]

## 2015-10-31 NOTE — Therapy (Signed)
[facility] [address] [place], [place], [address] Phone: [phone]   Fax:  [phone]  Occupational Therapy Treatment  Patient Details  Name: Judy Spears MRN: [id number] Date of Birth: 1950/11/11 Referring Provider: Dr. Sharion Dove  Encounter Date: 10/31/2015      OT End of Session - 10/31/15 1605    Visit Number 15   Number of Visits 24   Date for OT Re-Evaluation 11/18/15   Authorization Type [employer] unlimited visits   OT Start Time 1400   OT Stop Time 1445   OT Time Calculation (min) 45 min   Activity Tolerance Patient tolerated treatment well   Behavior During Therapy [facility] for tasks assessed/performed      Past Medical History:  Diagnosis Date  . Arthritis    lt great toe  . Complication of anesthesia    pt has had headaches post op-did advise to hydra well preop  . Hair loss    right-sided  . History of exercise stress test    a. ETT (12/15) with 12:00 exercise, no ST changes, occasional PACs.   . Hyperlipidemia   . Insomnia   . Migraine headache   . 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 Postmenopausal   . Premature atrial contractions    a. Holter (12/15) with 8% PACs, no atrial runs or atrial fibrillation.     Past Surgical History:  Procedure Laterality Date  . BREAST BIOPSY  01/01/2011   Procedure: BREAST BIOPSY WITH NEEDLE LOCALIZATION;  Surgeon: Rolm Bookbinder, MD;  Location: [facility];  Service: General;  Laterality: Left;  left breast wire localization biopsy  . cataracts right eye    . CESAREAN SECTION  1986  . HERNIA REPAIR  2000   RIH  . opirectinal membrane peel    . RHINOPLASTY  1983    There were no vitals filed for this visit.      Subjective Assessment - 10/31/15 1454    Subjective  Wow   Pertinent History see epic snapshot - ABI/hypoxia from [facility]   Patient Stated Goals Pt unable to state.    Currently in Pain? No/denies   Pain  Score 0-No pain                      OT Treatments/Exercises (OP) - 10/31/15 0001      Neurological Re-education Exercises   Other Exercises 1 Neuromuscular reeducation to address sit to stand transition as needed for toileting, clothing management.  Patient needs facilitation to come forward in chair to transition up to standing.  Patient with best ability to activate in LE's with fast transition up to standing.  Initially patient with increased lower extremity tension - bouncing, but with repetition, patient able to stand on active extended LE's.  Patient lacks full hip and knee extension - however, is improving amount of thoracic extension in standing.  In parallel bars once upright, able to stand with close supervision (using strap at shin to assist with knee extension.  On several occassions, patient able to pull self to standing with LE activation.  Patient still ahs LE activation after upper extremity activation, but is more consistently activating legs, and responding well to repetition.     Other Exercises 2 Neuromuscular reeducation to address eye hand control.  Patient does not spontaneously use vision to guide hand control for simple reach, grasp, release.  OT Education - 10/31/15 1602    Education provided Yes   Education Details Standing tolerance for increased count   Person(s) Educated Patient;Spouse;Caregiver(s)   Methods Explanation;Tactile cues;Demonstration;Verbal cues   Comprehension Verbalized understanding;Returned demonstration;Verbal cues required;Tactile cues required;Need further instruction          OT Short Term Goals - 10/28/15 1315      OT SHORT TERM GOAL #1   Title Pt and husband will be mod I with HEP - 10/21/2015   Status On-going     OT SHORT TERM GOAL #2   Title Pt will demonstrate adequate postural control for breath support to voice at least 3 words   Status Achieved     OT SHORT TERM GOAL #3   Title Pt will  transfer to commode with moderate assistance 75% of the time.   Status On-going     OT SHORT TERM GOAL #4   Title Pt will tolerate positional changes as evidenced by respiration rate and motor quieting in preparation for funcitonal mobility   Status Achieved           OT Long Term Goals - 10/28/15 1315      OT LONG TERM GOAL #1   Title Pt and husband will be mod I with upgraded HEP - 11/18/2015   Status On-going     OT LONG TERM GOAL #2   Title Pt will demonstrate adequate postural control to support 5 words in supported sitting.   Status On-going     OT LONG TERM GOAL #3   Title Pt will demonstrate ability for low reach with RUE for functional task with  mod facilitation   Status On-going     OT LONG TERM GOAL #4   Title Pt will wash face with LUE with min a in supported sitting   Status On-going     OT LONG TERM GOAL #5   Title Pt will release objects with L hand with min faciltation    Status On-going     OT LONG TERM GOAL #6   Title Pt will stand with min a while caregiver manages pants during LB dressing 75% of the time.   Status On-going     OT LONG TERM GOAL #7   Title Pt will transfer to commode with min a 75% of the time.   Status On-going     OT LONG TERM GOAL #8   Title Pt will be able to sit unsupported with supervision 50% of the time while engaged in functional task.   Status On-going               Plan - 10/31/15 1606    Clinical Impression Statement Patient with continued responsiveness and participation today.  Patient transitioning with decreased assistance to stand from sit.     Rehab Potential Fair   Clinical Impairments Affecting Rehab Potential severity of deficits, slow rate of recovery   OT Frequency 3x / week   OT Duration 8 weeks   OT Treatment/Interventions Self-care/ADL training;Ultrasound;Moist Heat;Electrical Stimulation;DME and/or AE instruction;Neuromuscular education;Therapeutic exercise;Functional Mobility Training;Manual  Therapy;Passive range of motion;Splinting;Therapeutic activities;Balance training;Patient/family education;Visual/perceptual remediation/compensation;Cognitive remediation/compensation   Plan LE activation for sit to stand, use of vision to aide UE function   OT Home Exercise Plan Discussed with patient, with husband present, the importance of flexing forward and using legs for transfers and standing   Consulted and Agree with Plan of Care Patient;Family member/caregiver   Family Member Consulted caregiver [name]  Patient will benefit from skilled therapeutic intervention in order to improve the following deficits and impairments:  Decreased endurance, Decreased skin integrity, Impaired vision/preception, Improper body mechanics, Impaired perceived functional ability, Decreased knowledge of precautions, Cardiopulmonary status limiting activity, Decreased activity tolerance, Decreased knowledge of use of DME, Decreased strength, Impaired flexibility, Improper spinal/pelvic alignment, Impaired sensation, Difficulty walking, Decreased mobility, Decreased balance, Decreased cognition, Decreased range of motion, Impaired tone, Pain, Impaired UE functional use, Decreased safety awareness, Decreased coordination  Visit Diagnosis: Other lack of coordination  Stiffness of left elbow, not elsewhere classified  Unsteadiness on feet  Muscle weakness (generalized)  Stiffness of right shoulder, not elsewhere classified  Stiffness of left shoulder, not elsewhere classified  Frontal lobe and executive function deficit  Abnormal posture    Problem List Patient Active Problem List   Diagnosis Date Noted  . 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[place]) 10/20/2015  . Tracheostomy status ([place]) 09/26/2015  . Allergic rhinitis 09/26/2015  . 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 Movement disorder 09/22/2015  . Encephalitis 09/22/2015  . Chest pain 02/07/2014  . Premature atrial contractions 01/13/2014  . Abnormality of  gait 12/04/2013  . Hyperlipidemia 12/21/2011  . Cardiovascular risk factor 12/21/2011  . Bunion 01/31/2008  . Metatarsalgia of both feet 09/20/2007  . FLAT FOOT 09/20/2007  . HALLUX RIGIDUS, ACQUIRED 09/20/2007    Mariah Milling, OTR/L 10/31/2015, 4:12 PM  [facility] [address] [place], [place], [address] Phone: [phone]   Fax:  [phone]  Name: Judy Spears MRN: [id number] Date of Birth: November 24, 1950

## 2015-11-01 NOTE — Therapy (Signed)
[facility] [address] [place], [place], [address] Phone: [phone]   Fax:  [phone]  Physical Therapy Treatment  Patient Details  Name: Judy Spears MRN: [id number] Date of Birth: September 22, 1950 Referring Provider: Alger Simons, MD Sharion Dove, MD)  Encounter Date: 10/31/2015      PT End of Session - 11/01/15 1204    Visit Number 17   Number of Visits 25   Date for PT Re-Evaluation 11/22/15   Authorization Type BC[employer]   PT Start Time 0938   PT Stop Time 1400   PT Time Calculation (min) 43 min   Activity Tolerance Patient tolerated treatment well   Behavior During Therapy [facility] for tasks assessed/performed      Past Medical History:  Diagnosis Date  . Arthritis    lt great toe  . Complication of anesthesia    pt has had headaches post op-did advise to hydra well preop  . Hair loss    right-sided  . History of exercise stress test    a. ETT (12/15) with 12:00 exercise, no ST changes, occasional PACs.   . Hyperlipidemia   . Insomnia   . Migraine headache   . 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 Postmenopausal   . Premature atrial contractions    a. Holter (12/15) with 8% PACs, no atrial runs or atrial fibrillation.     Past Surgical History:  Procedure Laterality Date  . BREAST BIOPSY  01/01/2011   Procedure: BREAST BIOPSY WITH NEEDLE LOCALIZATION;  Surgeon: Rolm Bookbinder, MD;  Location: [facility];  Service: General;  Laterality: Left;  left breast wire localization biopsy  . cataracts right eye    . CESAREAN SECTION  1986  . HERNIA REPAIR  2000   RIH  . opirectinal membrane peel    . RHINOPLASTY  1983    There were no vitals filed for this visit.      Subjective Assessment - 11/01/15 1202    Subjective Pt arrived to session accompanied by caregiver, [name], and husband, [name]. Pt awake, alert, and smiling.   Patient is accompained by: Family member  caregiver, [name],  and husband, [name]   Pertinent History Precautions: trach (capped), PEG, seizure. 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 No/denies      Donned B articulating custom AFO's prior to initiating transfers, standing.  NMR: Blocked practice of sit <> stand from personal tilt-in-space w/c (Roho cushion) with BUE support at // bars, anterior tibial block (fabric belt secured around // bars) as fulcrum and for increased kinesthetic awareness. 4" foam beam between feet and 10# weight placed at L ankle to maintain LE alignment due to tone causing increased L hip adduction/IR and L knee flexion. For sit > stand,+2A provided (one PT seated anterior to pt, other posterior) to maximize cueing/facilitation, promote maximal pt participation and muscle activation with pt progressing from performing 50% to 75% of transfer. Manual facilitation provided at B ribcage for thoracic extension and full anterior weight shift, initially at B iliac crests for counterforce to achieve full standing, at B gluteus max/medius for hip extensor activation without interference of hamstring tone, and  manual approximation at L knee  to maintain LLE alignment/L foot position. With increased repetition, pt demonstrates increased BLE activation and exhibits progressively less reactive LE movement (rhythmic hip/knee extension or"bouncing") with end range hip/knee extension.                        PT Short Term Goals - 10/29/15 2042      PT SHORT TERM GOAL #1   Title Pt will initiate anterior weight shift sufficient to unweight hips with supervision, 25% cueing in preparation for functional transfer.  (Target date: 10/21/15)   Baseline Can  initiate with min A inconsistently with 50% cuing.     Status Partially Met     PT SHORT TERM GOAL #2   Title Pt will perform seated scooting (AP/PA and M/L) with supervision, 25% cueing to increase independence with transfers, repositioning. (10/21/15)   Baseline 10/4: Requires min-mod A, 25-50% cueing from lowest mat table height   Status Partially Met     PT SHORT TERM GOAL #3   Title Pt will tolerate static standing with total A of standing frame x10 minutes with vital signs WNL to improve standing tolerance and for tone management.  (10/21/15)   Baseline met    Status Achieved     PT SHORT TERM GOAL #4   Title Pt will perform dynamic sitting >/= 5 minutes with BLE's supported, intermittent UE support with CGA to indicate improved postural stability/control necessary for functional transfers. (10/21/15)   Status Achieved     PT SHORT TERM GOAL #5   Title Pt will perform sit <> side lying with mod A, 50% cueing to progress toward increased independence with bed mobility.  (10/21/15)   Baseline 9/22: R sidelying > sit with mod A.    Status Achieved     PT SHORT TERM GOAL #6   Title Pt will consistently perform sit <> stand with min A, 25% cueing to indicate increased independence with functional transfers.  (10/21/15)   Baseline 10/4: Continues to require mod-max A    Status Not Met     PT SHORT TERM GOAL #7   Title Pt will perform level transfer from w/c <> mat table with mod A, 50% cueing to decrease caregiver burden with functional transfers.  (10/21/15)   Baseline mod/max A on 10/23/15, max A cues for sequencing and hand placement.    Status Partially Met           PT Long Term Goals - 09/23/15 2120      PT LONG TERM GOAL #1   Title Pt will perform supine > sit with mod A, 50% cueing to indicate increased independence with getting out of bed.  (Target: 11/18/15)     PT LONG TERM GOAL #2   Title Pt will perform sit > supine with mod A, 50% cueing to indicate increased  independence with getting into bed.  (11/18/15)     PT LONG TERM GOAL #3   Title Pt will perform level transfer from w/c <> mat table with min A, 25% cueing to decrease caregiver burden with functional transfers.  (11/18/15)     PT LONG TERM GOAL #4   Title Pt will perform dynamic standing >/= 2 consecutive minutes with BUE support and mod, 50% cueing to indicate improved postural stanbility/control.   (11/18/15)     PT LONG TERM GOAL #5   Title Patient will ambulate x15' with max A of spouse using LRAD to enable pt/spouse to safely walk into/out  of home bathroom.  (11/18/15)               Plan - 11/01/15 1205    Clinical Impression Statement Session focused on blocked practice of sit <> stand at parallel bars to contine to emphasis postural alignment/control, anterior weight shift, and LE activation. Pt able to initiate 50-75% of anterior weight shift without cueing during this session. Noted that reactive LE movement which generally occurs with increased B hip/knee extension was progressively less severe/limiting as pt performed active extension in WB during this session. Continues to require max cueing for LE activation.   Rehab Potential Good   Clinical Impairments Affecting Rehab Potential Positive: strong family/social support; negative: chronicity of condition   PT Frequency 3x / week   PT Duration 8 weeks   PT Treatment/Interventions ADLs/Self Care Home Management;DME Instruction;Gait training;Stair training;Functional mobility training;Therapeutic activities;Patient/family education;Cognitive remediation;Neuromuscular re-education;Therapeutic exercise;Balance training;Orthotic Fit/Training;Wheelchair mobility training;Manual techniques;Splinting;Visual/perceptual remediation/compensation;Vestibular;Passive range of motion   PT Next Visit Plan Emphasize midline alignment, anterior weight shift, and LE activation. Try 10# weight on L ankle to prevent interference of L hamstring tone  during standing.   Consulted and Agree with Plan of Care Patient;Family member/caregiver   Family Member Consulted husband, [name] and caregiver, [name]      Patient will benefit from skilled therapeutic intervention in order to improve the following deficits and impairments:  Abnormal gait, Decreased balance, Decreased endurance, Decreased cognition, Cardiopulmonary status limiting activity, Decreased activity tolerance, Decreased coordination, Decreased strength, Impaired flexibility, Impaired tone, Decreased mobility, Decreased skin integrity, Increased muscle spasms, Impaired vision/preception, Decreased range of motion, Impaired UE functional use, Postural dysfunction, Pain  Visit Diagnosis: Abnormal posture  Other abnormalities of gait and mobility  Stiffness of left hip, not elsewhere classified  Unsteadiness on feet  Other lack of coordination     Problem List Patient Active Problem List   Diagnosis Date Noted  . 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[place]) 10/20/2015  . Tracheostomy status ([place]) 09/26/2015  . Allergic rhinitis 09/26/2015  . 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 Movement disorder 09/22/2015  . Encephalitis 09/22/2015  . Chest pain 02/07/2014  . Premature atrial contractions 01/13/2014  . Abnormality of gait 12/04/2013  . Hyperlipidemia 12/21/2011  . Cardiovascular risk factor 12/21/2011  . Bunion 01/31/2008  . Metatarsalgia of both feet 09/20/2007  . FLAT FOOT 09/20/2007  . HALLUX RIGIDUS, ACQUIRED 09/20/2007    Stefano Gaul 11/01/2015, 12:10 PM  [facility] [address] [place], [place], [address] Phone: [phone]   Fax:  [phone]  Name: Judy Spears MRN: [id number] Date of Birth: [birth year]

## 2015-11-03 ENCOUNTER — Ambulatory Visit: Payer: BC Managed Care – PPO | Admitting: Occupational Therapy

## 2015-11-03 ENCOUNTER — Ambulatory Visit: Payer: BC Managed Care – PPO | Admitting: Physical Therapy

## 2015-11-03 ENCOUNTER — Encounter: Payer: Self-pay | Admitting: Pulmonary Disease

## 2015-11-03 ENCOUNTER — Ambulatory Visit: Payer: BC Managed Care – PPO

## 2015-11-03 ENCOUNTER — Encounter: Payer: Self-pay | Admitting: Occupational Therapy

## 2015-11-03 DIAGNOSIS — M25611 Stiffness of right shoulder, not elsewhere classified: Secondary | ICD-10-CM

## 2015-11-03 DIAGNOSIS — R1312 Dysphagia, oropharyngeal phase: Secondary | ICD-10-CM | POA: Diagnosis not present

## 2015-11-03 DIAGNOSIS — R2689 Other abnormalities of gait and mobility: Secondary | ICD-10-CM

## 2015-11-03 DIAGNOSIS — R471 Dysarthria and anarthria: Secondary | ICD-10-CM

## 2015-11-03 DIAGNOSIS — R2681 Unsteadiness on feet: Secondary | ICD-10-CM

## 2015-11-03 DIAGNOSIS — M6281 Muscle weakness (generalized): Secondary | ICD-10-CM

## 2015-11-03 DIAGNOSIS — R41841 Cognitive communication deficit: Secondary | ICD-10-CM

## 2015-11-03 DIAGNOSIS — R278 Other lack of coordination: Secondary | ICD-10-CM

## 2015-11-03 DIAGNOSIS — M25612 Stiffness of left shoulder, not elsewhere classified: Secondary | ICD-10-CM

## 2015-11-03 DIAGNOSIS — R293 Abnormal posture: Secondary | ICD-10-CM

## 2015-11-03 DIAGNOSIS — R41844 Frontal lobe and executive function deficit: Secondary | ICD-10-CM

## 2015-11-03 NOTE — Therapy (Signed)
[facility] [address] [place], [place], [address] Phone: [phone]   Fax:  [phone]  Speech Language Pathology Treatment  Patient Details  Name: Judy Spears MRN: [id number] Date of Birth: 08-21-1950 Referring Provider: Dr. Alger Simons  Encounter Date: 11/03/2015      End of Session - 11/03/15 1425    Visit Number 18   Number of Visits 24   Date for SLP Re-Evaluation 11/18/15   SLP Start Time 1108  late arriving to [facility]   SLP Stop Time  1146   SLP Time Calculation (min) 38 min   Activity Tolerance Patient tolerated treatment well      Past Medical History:  Diagnosis Date  . Arthritis    lt great toe  . Complication of anesthesia    pt has had headaches post op-did advise to hydra well preop  . Hair loss    right-sided  . History of exercise stress test    a. ETT (12/15) with 12:00 exercise, no ST changes, occasional PACs.   . Hyperlipidemia   . Insomnia   . Migraine headache   . 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 Postmenopausal   . Premature atrial contractions    a. Holter (12/15) with 8% PACs, no atrial runs or atrial fibrillation.     Past Surgical History:  Procedure Laterality Date  . BREAST BIOPSY  01/01/2011   Procedure: BREAST BIOPSY WITH NEEDLE LOCALIZATION;  Surgeon: Rolm Bookbinder, MD;  Location: [facility];  Service: General;  Laterality: Left;  left breast wire localization biopsy  . cataracts right eye    . CESAREAN SECTION  1986  . HERNIA REPAIR  2000   RIH  . opirectinal membrane peel    . RHINOPLASTY  1983    There were no vitals filed for this visit.      Subjective Assessment - 11/03/15 1408    Subjective Pt had practiced, "Hi Kammie Scioli" in PT/OT but did not vocalize with her attempts x4 when entering ST room.   Patient is accompained by: --  caregiver, [name]               ADULT SLP TREATMENT - 11/03/15 1409      General  Information   Behavior/Cognition Cooperative;Pleasant mood;Lethargic;Requires cueing;Decreased sustained attention;Other (comment)   Patient Positioning Upright in chair     Treatment Provided   Treatment provided Dysphagia;Cognitive-Linquistic     Dysphagia Treatment   Temperature Spikes Noted No   Respiratory Status Room air   Oral Cavity - Dentition Adequate natural dentition   Treatment Methods Skilled observation;Upgraded PO texture trial   Patient observed directly with PO's Yes   Type of PO's observed Dysphagia 1 (puree);Thin liquids   Feeding Total assist   Liquids provided via Teaspoon   Oral Phase Signs & Symptoms Anterior loss/spillage;Oral holding   Pharyngeal Phase Signs & Symptoms Suspected delayed swallow initiation;Delayed cough;Wet vocal quality  delay cough 25 sec x1, wet voice 30% presentations   Type of cueing Verbal;Tactile;Visual   Amount of cueing Maximal   Other treatment/comments Pt alert for approx 50% of session today. SLP facilitated pt's readiness for modified (MBSS) today by presenting ice chip x12. Spontaneous swallows occurred 40% of the time but none in last 5 presentations. Swallows were within 7 seconds of presentation 30% of the time and req'd mod-max A consistently. Oral spillage was observed 75% of the time with SLP mod verbal and visual  cues for lip closure. Four times, pt demo'd anterior labial leakage from lt margin >30 seconds after original presentation. Reduced thyroid elevation noted after 5 swallows/presentations.     Pain Assessment   Pain Assessment Faces   Pain Score 0-No pain     Cognitive-Linquistic Treatment   Treatment focused on Voice;Dysarthria;Patient/family/caregiver education   Skilled Treatment Pt imitated SLP voicing in single words 0%, but instead whispered SLP single words 5/7 (approx 70%). SLP encouraged pt's voicing by manipulation of arms to open ribcage and fill lungs, and as tactile cue for loud speech/incr'd breath  support. Pt with barely audible voicing during these attempts 2/13 (approx 15%). Spontaneous voicing x2 ("Nice to be here.") in response to SLP "Good to see you," IMMEDIATELY following when pt opening eyes and waking from rest/sleep. Pt with oral movement of lips/tongue but no whispering x3, despite SLP/caregiver cues to speak louder. One such instance was for oral movement for approx 7 seconds.     Assessment / Recommendations / Plan   Plan Continue with current plan of care     Progression Toward Goals   Progression toward goals Progressing toward goals  pt appears motivated for change            SLP Short Term Goals - 10/31/15 1713      SLP SHORT TERM GOAL #1   Title Pt will demonstrate audible phonation with words, vowels, or syllables in 4/10 trials with max A   Time 1   Period Weeks   Status Revised     SLP SHORT TERM GOAL #2   Title Pt will demonstrate breath support with max A, prior to repetition of words 4/10   Time 1   Period Weeks   Status Revised     SLP SHORT TERM GOAL #3   Title Pt will swallow ice chip/H20 via spoon in less than 7 seconds with usual min to mod A 3/5 trials   Time 1   Period Weeks   Status On-going     SLP SHORT TERM GOAL #4   Title Pt will sustain attention to therapy task for over 7 minutes with occasional min A   Time 1   Period Weeks   Status On-going          SLP Long Term Goals - 11/03/15 1430      SLP LONG TERM GOAL #1   Title Pt will demonstrate audible phonation/intelligible speech with 2 word phrases with occasional min A 5/7 utterances   Time 2   Period Weeks   Status On-going     SLP LONG TERM GOAL #2   Title Pt will demonstrate audible cough/throat clear to command and to clear secretions with occasional min A 4/5 trials.    Time 2   Period Weeks   Status On-going     SLP LONG TERM GOAL #3   Title Pt will achieve timely oral phase and swallow with ice chip/ water trials 5/7 trials   Time 2   Period Weeks    Status On-going     SLP LONG TERM GOAL #4   Title Pt will sustain attention to therapy tasks for 12 minutes with occasiona min A   Time 2   Period Weeks   Status On-going          Plan - 11/03/15 1426    Clinical Impression Statement Focus on speech/voicing and swallowing continue. Timewise, pt was most alert she has been with this SLP today. She still  has significant deficits in speech and swallowing, but today was more alert and attempted more swallows and speech. Skilled ST remains needed to decr caregiver burden.    Speech Therapy Frequency 3x / week   Duration 2 weeks  or 8 visits   Treatment/Interventions Aspiration precaution training;Trials of upgraded texture/liquids;Compensatory strategies;Functional tasks;Patient/family education;Cueing hierarchy;Diet toleration management by SLP;Cognitive reorganization;Compensatory techniques;SLP instruction and feedback;Internal/external aids;Multimodal communcation approach   Potential to Achieve Goals Fair   Potential Considerations Ability to learn/carryover information;Co-morbidities;Severity of impairments   Consulted and Agree with Plan of Care Family member/caregiver   Family Member Consulted [name]- spouse      Patient will benefit from skilled therapeutic intervention in order to improve the following deficits and impairments:   Dysphagia, oropharyngeal phase  Dysarthria and anarthria  Cognitive communication deficit    Problem List Patient Active Problem List   Diagnosis Date Noted  . 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[place]) 10/20/2015  . Tracheostomy status ([place]) 09/26/2015  . Allergic rhinitis 09/26/2015  . 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 Movement disorder 09/22/2015  . Encephalitis 09/22/2015  . Chest pain 02/07/2014  . Premature atrial contractions 01/13/2014  . Abnormality of gait 12/04/2013  . Hyperlipidemia 12/21/2011  . Cardiovascular risk factor 12/21/2011  . Bunion 01/31/2008  . Metatarsalgia of both feet 09/20/2007   . FLAT FOOT 09/20/2007  . HALLUX RIGIDUS, ACQUIRED 09/20/2007    [facility] ,MS, CCC-SLP  11/03/2015, 2:30 PM  [facility] [address] [place], [place], [address] Phone: [phone]   Fax:  [phone]   Name: Judy Spears MRN: [id number] Date of Birth: 1950-02-18

## 2015-11-03 NOTE — Therapy (Signed)
[facility] [address] [place], [place], [address] Phone: [phone]   Fax:  [phone]  Physical Therapy Treatment  Patient Details  Name: Judy Spears MRN: [id number] Date of Birth: [birth year] Referring Provider: Alger Simons, MD Sharion Dove, MD)  Encounter Date: 11/03/2015      PT End of Session - 11/03/15 1713    Visit Number 18   Number of Visits 25   Date for PT Re-Evaluation 11/22/15   Authorization Type BCBS   PT Start Time 0935   PT Stop Time 1015   PT Time Calculation (min) 40 min   Activity Tolerance Patient tolerated treatment well   Behavior During Therapy [facility] for tasks assessed/performed      Past Medical History:  Diagnosis Date  . Arthritis    lt great toe  . Complication of anesthesia    pt has had headaches post op-did advise to hydra well preop  . Hair loss    right-sided  . History of exercise stress test    a. ETT (12/15) with 12:00 exercise, no ST changes, occasional PACs.   . Hyperlipidemia   . Insomnia   . Migraine headache   . 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 Postmenopausal   . Premature atrial contractions    a. Holter (12/15) with 8% PACs, no atrial runs or atrial fibrillation.     Past Surgical History:  Procedure Laterality Date  . BREAST BIOPSY  01/01/2011   Procedure: BREAST BIOPSY WITH NEEDLE LOCALIZATION;  Surgeon: Rolm Bookbinder, MD;  Location: [facility];  Service: General;  Laterality: Left;  left breast wire localization biopsy  . cataracts right eye    . CESAREAN SECTION  1986  . HERNIA REPAIR  2000   RIH  . opirectinal membrane peel    . RHINOPLASTY  1983    There were no vitals filed for this visit.      Subjective Assessment - 11/03/15 1712    Subjective Pt awake and alert. Accompanied by caregiver, [name], who states husband is in [facility] today.   Patient is accompained by: Family member  caregiver, [name]   Pertinent History Precautions: trach (capped), PEG, seizure. 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 No/denies            Donned B articulating custom AFO's prior to initiating transfers, standing.  NMR: Transferred from tilt-in-space w/c <> mat table (level transfer) with mod-max A, 25% cueing for full anterior weight shift, 75% cueing for LE activation. Performed blocked practice of sit <> stand from elevated mat table with +2A provided (one PT seated to R of pt; other PT seated to L) to maximize cueing/facilitation, promote maximal pt participation and muscle activation. 10# weight at L ankle to maintain L foot in contact with floor during standing activity. Manual facilitation provided at B ribcage for thoracic extension and full anterior weight shift; PT's provided counter force at anterior tibias and posterior thighs as well as tactile cueing at at B gluteus max/medius for hip extensor activation without interference of hamstring tone, and  manual approximation at B knees for increased proprioceptive input. While standing, pt engaged in activity involving using  dry erase marker to write on target on mirror in attempt to facilitate upright posture. Required max manual facilitation/cueing for final 25% of B hip/knee extension to achieve full standing; however, pt continues to exhibit progressively less reactive LE movement (rhythmic hip/knee extension or"bouncing") with end range hip/knee extension.                          PT Short Term Goals - 10/29/15 2042      PT SHORT TERM GOAL #1   Title Pt will initiate anterior weight shift sufficient to unweight hips with supervision, 25%  cueing in preparation for functional transfer.  (Target date: 10/21/15)   Baseline Can initiate with min A inconsistently with 50% cuing.     Status Partially Met     PT SHORT TERM GOAL #2   Title Pt will perform seated scooting (AP/PA and M/L) with supervision, 25% cueing to increase independence with transfers, repositioning. (10/21/15)   Baseline 10/4: Requires min-mod A, 25-50% cueing from lowest mat table height   Status Partially Met     PT SHORT TERM GOAL #3   Title Pt will tolerate static standing with total A of standing frame x10 minutes with vital signs WNL to improve standing tolerance and for tone management.  (10/21/15)   Baseline met    Status Achieved     PT SHORT TERM GOAL #4   Title Pt will perform dynamic sitting >/= 5 minutes with BLE's supported, intermittent UE support with CGA to indicate improved postural stability/control necessary for functional transfers. (10/21/15)   Status Achieved     PT SHORT TERM GOAL #5   Title Pt will perform sit <> side lying with mod A, 50% cueing to progress toward increased independence with bed mobility.  (10/21/15)   Baseline 9/22: R sidelying > sit with mod A.    Status Achieved     PT SHORT TERM GOAL #6   Title Pt will consistently perform sit <> stand with min A, 25% cueing to indicate increased independence with functional transfers.  (10/21/15)   Baseline 10/4: Continues to require mod-max A    Status Not Met     PT SHORT TERM GOAL #7   Title Pt will perform level transfer from w/c <> mat table with mod A, 50% cueing to decrease caregiver burden with functional transfers.  (10/21/15)   Baseline mod/max A on 10/23/15, max A cues for sequencing and hand placement.    Status Partially Met           PT Long Term Goals - 09/23/15 2120      PT LONG TERM GOAL #1   Title Pt will perform supine > sit with mod A, 50% cueing to indicate increased independence with getting out of bed.  (Target: 11/18/15)     PT LONG TERM GOAL #2    Title Pt will perform sit > supine with mod A, 50% cueing to indicate increased independence with getting into bed.  (11/18/15)     PT LONG TERM GOAL #3   Title Pt will perform level transfer from w/c <> mat table with min A, 25% cueing to decrease caregiver burden with functional transfers.  (11/18/15)     PT LONG TERM GOAL #4   Title Pt will perform dynamic standing >/= 2 consecutive minutes with BUE support and mod, 50% cueing to indicate improved postural stanbility/control.   (11/18/15)     PT LONG TERM GOAL #5  Title Patient will ambulate x15' with max A of spouse using LRAD to enable pt/spouse to safely walk into/out of home bathroom.  (11/18/15)               Plan - 11/03/15 1810    Clinical Impression Statement Session focuse on continued transfer training without use of parallel bars or anterior tibial strap. Pt tolerated interventions without overt signs of discomfort. Continues to require max multimodal cueing for final 25% of hip/knee extension during standing.    Rehab Potential Good   Clinical Impairments Affecting Rehab Potential Positive: strong family/social support; negative: chronicity of condition   PT Frequency 3x / week   PT Duration 8 weeks   PT Treatment/Interventions ADLs/Self Care Home Management;DME Instruction;Gait training;Stair training;Functional mobility training;Therapeutic activities;Patient/family education;Cognitive remediation;Neuromuscular re-education;Therapeutic exercise;Balance training;Orthotic Fit/Training;Wheelchair mobility training;Manual techniques;Splinting;Visual/perceptual remediation/compensation;Vestibular;Passive range of motion   PT Next Visit Plan Emphasize midline alignment, anterior weight shift, and LE activation.   Consulted and Agree with Plan of Care Patient;Family member/caregiver   Family Member Consulted caregiver, [name]      Patient will benefit from skilled therapeutic intervention in order to improve the following  deficits and impairments:  Abnormal gait, Decreased balance, Decreased endurance, Decreased cognition, Cardiopulmonary status limiting activity, Decreased activity tolerance, Decreased coordination, Decreased strength, Impaired flexibility, Impaired tone, Decreased mobility, Decreased skin integrity, Increased muscle spasms, Impaired vision/preception, Decreased range of motion, Impaired UE functional use, Postural dysfunction, Pain  Visit Diagnosis: Other abnormalities of gait and mobility  Unsteadiness on feet  Other lack of coordination     Problem List Patient Active Problem List   Diagnosis Date Noted  . 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[place]) 10/20/2015  . Tracheostomy status ([place]) 09/26/2015  . Allergic rhinitis 09/26/2015  . 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 Movement disorder 09/22/2015  . Encephalitis 09/22/2015  . Chest pain 02/07/2014  . Premature atrial contractions 01/13/2014  . Abnormality of gait 12/04/2013  . Hyperlipidemia 12/21/2011  . Cardiovascular risk factor 12/21/2011  . Bunion 01/31/2008  . Metatarsalgia of both feet 09/20/2007  . FLAT FOOT 09/20/2007  . HALLUX RIGIDUS, ACQUIRED 09/20/2007    Billie Ruddy, PT, DPT [facility] [address] [place], [place], [address] Phone: [phone]   Fax:  [phone] 11/03/15, 6:22 PM  Name: Judy Spears MRN: [id number] Date of Birth: March 09, 1950

## 2015-11-03 NOTE — Therapy (Signed)
[facility] [address] [place], [place], [address] Phone: [phone]   Fax:  [phone]  Occupational Therapy Treatment  Patient Details  Name: Judy Spears MRN: [id number] Date of Birth: 05-18-1950 Referring Provider: Dr. Sharion Dove  Encounter Date: 11/03/2015      OT End of Session - 11/03/15 1255    Visit Number 16   Number of Visits 24   Date for OT Re-Evaluation 11/18/15   Authorization Type BCBS unlimited visits   OT Start Time 1017   OT Stop Time 1101   OT Time Calculation (min) 44 min   Activity Tolerance Patient tolerated treatment well      Past Medical History:  Diagnosis Date  . Arthritis    lt great toe  . Complication of anesthesia    pt has had headaches post op-did advise to hydra well preop  . Hair loss    right-sided  . History of exercise stress test    a. ETT (12/15) with 12:00 exercise, no ST changes, occasional PACs.   . Hyperlipidemia   . Insomnia   . Migraine headache   . 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 Postmenopausal   . Premature atrial contractions    a. Holter (12/15) with 8% PACs, no atrial runs or atrial fibrillation.     Past Surgical History:  Procedure Laterality Date  . BREAST BIOPSY  01/01/2011   Procedure: BREAST BIOPSY WITH NEEDLE LOCALIZATION;  Surgeon: Rolm Bookbinder, MD;  Location: [facility];  Service: General;  Laterality: Left;  left breast wire localization biopsy  . cataracts right eye    . CESAREAN SECTION  1986  . HERNIA REPAIR  2000   RIH  . opirectinal membrane peel    . RHINOPLASTY  1983    There were no vitals filed for this visit.      Subjective Assessment - 11/03/15 1239    Patient is accompained by: --  caregiver [name]   Pertinent History see epic snapshot - ABI/hypoxia from [place]   Patient Stated Goals Pt unable to state.    Currently in Pain? No/denies  No evidence of pain today                       OT Treatments/Exercises (OP) - 11/03/15 0001      Neurological Re-education Exercises   Other Exercises 1 Neuro re ed to address sitting balance, finding midline in sitting, postural orientation in sitting.  Also addressed sit to stand - pt needs mod faciliation to come forward prior to sit to stand however is improving in her ability to initatie this with vc's and intermittent min faciltation.  Max faciltation for  sit to stand with increased trunk extension in standing as well as improved alignment and extension in BLE's.  Pt's control improves with increased weight bearing and time up on feet (decreased bouncing, posterior bias, increased trunk, hip and knee extension).  Pt also improving in her ability to tolerate movement as well as up on feet.  Pt with improved initiation for sit to stand and can push with BLE's with cues.  Pt benefits from significant repetition.  Also addressed pt's ability to voice outloud which is greatly improved in standing (asked pt to count, sing, call out with 2 words).  Pt performance improves when expectations are clear and pt understands goal of activity.  Pt much brighter today and much more  engaged and alert.                   OT Short Term Goals - 11/03/15 1252      OT SHORT TERM GOAL #1   Title Pt and husband will be mod I with HEP - 10/21/2015   Status On-going     OT SHORT TERM GOAL #2   Title Pt will demonstrate adequate postural control for breath support to voice at least 3 words   Status Achieved     OT SHORT TERM GOAL #3   Title Pt will transfer to commode with moderate assistance 75% of the time.   Status On-going     OT SHORT TERM GOAL #4   Title Pt will tolerate positional changes as evidenced by respiration rate and motor quieting in preparation for funcitonal mobility   Status Achieved           OT Long Term Goals - 11/03/15 1252      OT LONG TERM GOAL #1   Title Pt and husband will be mod  I with upgraded HEP - 11/18/2015   Status On-going     OT LONG TERM GOAL #2   Title Pt will demonstrate adequate postural control to support 5 words in supported sitting.   Status On-going     OT LONG TERM GOAL #3   Title Pt will demonstrate ability for low reach with RUE for functional task with  mod facilitation   Status Achieved     OT LONG TERM GOAL #4   Title Pt will wash face with LUE with min a in supported sitting   Status On-going     OT LONG TERM GOAL #5   Title Pt will release objects with L hand with min faciltation    Status On-going     OT LONG TERM GOAL #6   Title Pt will stand with min a while caregiver manages pants during LB dressing 75% of the time.   Status On-going     OT LONG TERM GOAL #7   Title Pt will transfer to commode with min a 75% of the time.   Status On-going     OT LONG TERM GOAL #8   Title Pt will be able to sit unsupported with supervision 50% of the time while engaged in functional task.   Status On-going               Plan - 11/03/15 1253    Clinical Impression Statement Pt much brighter today and much more engaged and alert.  Pt with improving ability to initiate movement with max vc's and min- mod facilitation.     Rehab Potential Fair   Clinical Impairments Affecting Rehab Potential severity of deficits, slow rate of recovery   OT Frequency 3x / week   OT Duration 8 weeks   OT Treatment/Interventions Self-care/ADL training;Ultrasound;Moist Heat;Electrical Stimulation;DME and/or AE instruction;Neuromuscular education;Therapeutic exercise;Functional Mobility Training;Manual Therapy;Passive range of motion;Splinting;Therapeutic activities;Balance training;Patient/family education;Visual/perceptual remediation/compensation;Cognitive remediation/compensation   Plan LE activiation for sit to stand, voicing to demonstrate improved trunk control, attempt simple reaching activities with RUE/washing face with LUE in unsupported sitting.    Consulted and Agree with Plan of Care Patient;Family member/caregiver   Family Member Consulted caregiver [name]      Patient will benefit from skilled therapeutic intervention in order to improve the following deficits and impairments:  Decreased endurance, Decreased skin integrity, Impaired vision/preception, Improper body mechanics, Impaired perceived functional ability, Decreased knowledge of precautions,  Cardiopulmonary status limiting activity, Decreased activity tolerance, Decreased knowledge of use of DME, Decreased strength, Impaired flexibility, Improper spinal/pelvic alignment, Impaired sensation, Difficulty walking, Decreased mobility, Decreased balance, Decreased cognition, Decreased range of motion, Impaired tone, Pain, Impaired UE functional use, Decreased safety awareness, Decreased coordination  Visit Diagnosis: Abnormal posture  Muscle weakness (generalized)  Stiffness of right shoulder, not elsewhere classified  Stiffness of left shoulder, not elsewhere classified  Frontal lobe and executive function deficit  Other abnormalities of gait and mobility    Problem List Patient Active Problem List   Diagnosis Date Noted  . 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[place]) 10/20/2015  . Tracheostomy status ([place]) 09/26/2015  . Allergic rhinitis 09/26/2015  . 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 Movement disorder 09/22/2015  . Encephalitis 09/22/2015  . Chest pain 02/07/2014  . Premature atrial contractions 01/13/2014  . Abnormality of gait 12/04/2013  . Hyperlipidemia 12/21/2011  . Cardiovascular risk factor 12/21/2011  . Bunion 01/31/2008  . Metatarsalgia of both feet 09/20/2007  . FLAT FOOT 09/20/2007  . HALLUX RIGIDUS, ACQUIRED 09/20/2007    Quay Burow, OTR/L 11/03/2015, 12:58 PM  [facility] [address] [place], [place], [address] Phone: [phone]   Fax:  [phone]  Name: ZIXUAN SALKOWSKI MRN:  [id number] Date of Birth: 1951-01-12

## 2015-11-04 ENCOUNTER — Ambulatory Visit: Payer: BC Managed Care – PPO | Admitting: Physical Therapy

## 2015-11-04 ENCOUNTER — Encounter: Payer: Self-pay | Admitting: Occupational Therapy

## 2015-11-04 ENCOUNTER — Ambulatory Visit: Payer: BC Managed Care – PPO | Admitting: Occupational Therapy

## 2015-11-04 DIAGNOSIS — R278 Other lack of coordination: Secondary | ICD-10-CM

## 2015-11-04 DIAGNOSIS — R293 Abnormal posture: Secondary | ICD-10-CM

## 2015-11-04 DIAGNOSIS — M25612 Stiffness of left shoulder, not elsewhere classified: Secondary | ICD-10-CM

## 2015-11-04 DIAGNOSIS — M25611 Stiffness of right shoulder, not elsewhere classified: Secondary | ICD-10-CM

## 2015-11-04 DIAGNOSIS — R41844 Frontal lobe and executive function deficit: Secondary | ICD-10-CM

## 2015-11-04 DIAGNOSIS — R2689 Other abnormalities of gait and mobility: Secondary | ICD-10-CM

## 2015-11-04 DIAGNOSIS — R2681 Unsteadiness on feet: Secondary | ICD-10-CM

## 2015-11-04 DIAGNOSIS — M6281 Muscle weakness (generalized): Secondary | ICD-10-CM

## 2015-11-04 DIAGNOSIS — R1312 Dysphagia, oropharyngeal phase: Secondary | ICD-10-CM | POA: Diagnosis not present

## 2015-11-04 NOTE — Therapy (Signed)
[facility] [address] [place], [place], [address] Phone: [phone]   Fax:  [phone]  Occupational Therapy Treatment  Patient Details  Name: Judy Spears MRN: [id number] Date of Birth: [birth year] Referring Provider: Dr. Sharion Dove  Encounter Date: 11/04/2015      OT End of Session - 11/04/15 1300    Visit Number 17   Number of Visits 24   Date for OT Re-Evaluation 11/18/15   Authorization Type BCBS unlimited visits   OT Start Time 1148   OT Stop Time 1235   OT Time Calculation (min) 47 min   Activity Tolerance Patient tolerated treatment well      Past Medical History:  Diagnosis Date  . Arthritis    lt great toe  . Complication of anesthesia    pt has had headaches post op-did advise to hydra well preop  . Hair loss    right-sided  . History of exercise stress test    a. ETT (12/15) with 12:00 exercise, no ST changes, occasional PACs.   . Hyperlipidemia   . Insomnia   . Migraine headache   . 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 Postmenopausal   . Premature atrial contractions    a. Holter (12/15) with 8% PACs, no atrial runs or atrial fibrillation.     Past Surgical History:  Procedure Laterality Date  . BREAST BIOPSY  01/01/2011   Procedure: BREAST BIOPSY WITH NEEDLE LOCALIZATION;  Surgeon: Rolm Bookbinder, MD;  Location: [facility];  Service: General;  Laterality: Left;  left breast wire localization biopsy  . cataracts right eye    . CESAREAN SECTION  1986  . HERNIA REPAIR  2000   RIH  . opirectinal membrane peel    . RHINOPLASTY  1983    There were no vitals filed for this visit.      Subjective Assessment - 11/04/15 1246    Subjective  Pt able to count out loud when in standing   Pertinent History see epic snapshot - ABI/hypoxia from [place]   Patient Stated Goals Pt unable to state.    Currently in Pain? No/denies                       OT Treatments/Exercises (OP) - 11/04/15 0001      Neurological Re-education Exercises   Other Exercises 1 Neuro re ed to address forward weight shift/lean in prep for sit to stand - pt with much better ability to initiat this using BUE's.  Addresesd sit to stand, static standing and begining ability for lateral weight shift and "lightening" on RLE in prep for improved functional mobility.  Also addressed stand to sit with cues and facilitation for forward lean and controlled descent. Empahsis also placed on thoraxic extension in standing as well as using RUE for functional reaching tasks while uprigtht on feet. Pt able to follow 1 step instructions much more consistently today as well.  Also addressed scooting back in wheelchair with alternating lateral weight shift and scoot.  Working toward pt being able to stand using grab bar to allow caregiver to manage pants during LB dressing and toileting.                     OT Short Term Goals - 11/04/15 1254      OT SHORT TERM GOAL #1   Title Pt and husband will be mod I  with HEP - 10/21/2015   Status On-going     OT SHORT TERM GOAL #2   Title Pt will demonstrate adequate postural control for breath support to voice at least 3 words   Status Achieved     OT SHORT TERM GOAL #3   Title Pt will transfer to commode with moderate assistance 75% of the time.   Status On-going     OT SHORT TERM GOAL #4   Title Pt will tolerate positional changes as evidenced by respiration rate and motor quieting in preparation for funcitonal mobility   Status Achieved           OT Long Term Goals - 11/04/15 1254      OT LONG TERM GOAL #1   Title Pt and husband will be mod I with upgraded HEP - 11/18/2015   Status On-going     OT LONG TERM GOAL #2   Title Pt will demonstrate adequate postural control to support 5 words in supported sitting.   Status On-going     OT LONG TERM GOAL #3   Title Pt will demonstrate  ability for low reach with RUE for functional task with  mod facilitation   Status Achieved     OT LONG TERM GOAL #4   Title Pt will wash face with LUE with min a in supported sitting   Status On-going     OT LONG TERM GOAL #5   Title Pt will release objects with L hand with min faciltation    Status On-going     OT LONG TERM GOAL #6   Title Pt will stand with min a while caregiver manages pants during LB dressing 75% of the time.   Status On-going     OT LONG TERM GOAL #7   Title Pt will transfer to commode with min a 75% of the time.   Status On-going     OT LONG TERM GOAL #8   Title Pt will be able to sit unsupported with supervision 50% of the time while engaged in functional task.   Status On-going               Plan - 11/04/15 1255    Clinical Impression Statement Pt with slow progress toward goals  Pt able to follow one step commands today much more consistently  Pt benefits from repetition.   Rehab Potential Fair   Clinical Impairments Affecting Rehab Potential severity of deficits, slow rate of recovery   OT Frequency 3x / week   OT Duration 8 weeks   OT Treatment/Interventions Self-care/ADL training;Ultrasound;Moist Heat;Electrical Stimulation;DME and/or AE instruction;Neuromuscular education;Therapeutic exercise;Functional Mobility Training;Manual Therapy;Passive range of motion;Splinting;Therapeutic activities;Balance training;Patient/family education;Visual/perceptual remediation/compensation;Cognitive remediation/compensation   Plan LE activiation for sit to stand, voicing to demonstrate improved trunk control, reaching activities in sitting and standing, basic grooming.    Consulted and Agree with Plan of Care Patient;Family member/caregiver   Family Member Consulted caregiver [name]      Patient will benefit from skilled therapeutic intervention in order to improve the following deficits and impairments:  Decreased endurance, Decreased skin integrity,  Impaired vision/preception, Improper body mechanics, Impaired perceived functional ability, Decreased knowledge of precautions, Cardiopulmonary status limiting activity, Decreased activity tolerance, Decreased knowledge of use of DME, Decreased strength, Impaired flexibility, Improper spinal/pelvic alignment, Impaired sensation, Difficulty walking, Decreased mobility, Decreased balance, Decreased cognition, Decreased range of motion, Impaired tone, Pain, Impaired UE functional use, Decreased safety awareness, Decreased coordination  Visit Diagnosis: Abnormal posture  Stiffness of right  shoulder, not elsewhere classified  Stiffness of left shoulder, not elsewhere classified  Frontal lobe and executive function deficit  Muscle weakness (generalized)  Other abnormalities of gait and mobility    Problem List Patient Active Problem List   Diagnosis Date Noted  . 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[place]) 10/20/2015  . Tracheostomy status ([place]) 09/26/2015  . Allergic rhinitis 09/26/2015  . 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 Movement disorder 09/22/2015  . Encephalitis 09/22/2015  . Chest pain 02/07/2014  . Premature atrial contractions 01/13/2014  . Abnormality of gait 12/04/2013  . Hyperlipidemia 12/21/2011  . Cardiovascular risk factor 12/21/2011  . Bunion 01/31/2008  . Metatarsalgia of both feet 09/20/2007  . FLAT FOOT 09/20/2007  . HALLUX RIGIDUS, ACQUIRED 09/20/2007    Quay Burow, OTR/L 11/04/2015, 1:04 PM  [facility] [address] [place], [place], [address] Phone: [phone]   Fax:  [phone]  Name: Judy Spears MRN: [id number] Date of Birth: September 15, 1950

## 2015-11-04 NOTE — Therapy (Signed)
[facility] [address] [place], [place], [address] Phone: [phone]   Fax:  [phone]  Physical Therapy Treatment  Patient Details  Name: Judy Spears MRN: [id number] Date of Birth: 22-Nov-1950 Referring Provider: Alger Simons, MD Sharion Dove, MD)  Encounter Date: 11/04/2015      PT End of Session - 11/04/15 1343    Visit Number 19   Number of Visits 25   Authorization Type BCBS   PT Start Time 1104   PT Stop Time 1145   PT Time Calculation (min) 41 min   Activity Tolerance Patient tolerated treatment well   Behavior During Therapy [facility] for tasks assessed/performed      Past Medical History:  Diagnosis Date  . Arthritis    lt great toe  . Complication of anesthesia    pt has had headaches post op-did advise to hydra well preop  . Hair loss    right-sided  . History of exercise stress test    a. ETT (12/15) with 12:00 exercise, no ST changes, occasional PACs.   . Hyperlipidemia   . Insomnia   . Migraine headache   . 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 Postmenopausal   . Premature atrial contractions    a. Holter (12/15) with 8% PACs, no atrial runs or atrial fibrillation.     Past Surgical History:  Procedure Laterality Date  . BREAST BIOPSY  01/01/2011   Procedure: BREAST BIOPSY WITH NEEDLE LOCALIZATION;  Surgeon: Rolm Bookbinder, MD;  Location: [facility];  Service: General;  Laterality: Left;  left breast wire localization biopsy  . cataracts right eye    . CESAREAN SECTION  1986  . HERNIA REPAIR  2000   RIH  . opirectinal membrane peel    . RHINOPLASTY  1983    There were no vitals filed for this visit.      Subjective Assessment - 11/04/15 1338    Subjective Per caregiver, [name], "She is ready to stand."   Patient is accompained by: --  caregiver, [name]   Pertinent History Precautions: trach (capped), PEG, seizure. 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 No/denies          NMR: Blocked practice of sit <> stand from personal tilt-in-space w/c (stability cushion) with BUE support at // bars, anterior tibial block (fabric belt secured around // bars) as fulcrum and for increased kinesthetic awareness. Black foam beam between feet and 10# weight placed at L ankle to maintain LE alignment due to tone causing increased L knee flexion. For sit > stand,+2A provided (one PT seated anterior to pt, SPT providing manual stabilization of LLE on floor) to maximize cueing/facilitation, promote maximal pt participation and muscle activation. Manual facilitation provided at B ribcage for thoracic extension and full anterior weight shift. For initial 3 sit <> stand transfers, PT provided B HHA to promote thoracic spine extension, upright posture. During remaining transfers, pt transitioned from sit > stand with BUE support at // bars requiring cueing as described, in addition to max manual facilitation at B hip extensors for full B hip extension. With B HHA support, pt progressed from requiring mod A to as little  as min A. With BUE support at // bars, pt performed sit > stand transfers with mod-max A. Use of small declined surface under B heels (for increased anterior weight shift) appeared grossly effectively but did make LLE stabilization more difficulty at times due to tone.                         PT Short Term Goals - 10/29/15 2042      PT SHORT TERM GOAL #1   Title Pt will initiate anterior weight shift sufficient to unweight hips with supervision, 25% cueing in preparation for functional transfer.  (Target date: 10/21/15)   Baseline Can initiate with min  A inconsistently with 50% cuing.     Status Partially Met     PT SHORT TERM GOAL #2   Title Pt will perform seated scooting (AP/PA and M/L) with supervision, 25% cueing to increase independence with transfers, repositioning. (10/21/15)   Baseline 10/4: Requires min-mod A, 25-50% cueing from lowest mat table height   Status Partially Met     PT SHORT TERM GOAL #3   Title Pt will tolerate static standing with total A of standing frame x10 minutes with vital signs WNL to improve standing tolerance and for tone management.  (10/21/15)   Baseline met    Status Achieved     PT SHORT TERM GOAL #4   Title Pt will perform dynamic sitting >/= 5 minutes with BLE's supported, intermittent UE support with CGA to indicate improved postural stability/control necessary for functional transfers. (10/21/15)   Status Achieved     PT SHORT TERM GOAL #5   Title Pt will perform sit <> side lying with mod A, 50% cueing to progress toward increased independence with bed mobility.  (10/21/15)   Baseline 9/22: R sidelying > sit with mod A.    Status Achieved     PT SHORT TERM GOAL #6   Title Pt will consistently perform sit <> stand with min A, 25% cueing to indicate increased independence with functional transfers.  (10/21/15)   Baseline 10/4: Continues to require mod-max A    Status Not Met     PT SHORT TERM GOAL #7   Title Pt will perform level transfer from w/c <> mat table with mod A, 50% cueing to decrease caregiver burden with functional transfers.  (10/21/15)   Baseline mod/max A on 10/23/15, max A cues for sequencing and hand placement.    Status Partially Met           PT Long Term Goals - 09/23/15 2120      PT LONG TERM GOAL #1   Title Pt will perform supine > sit with mod A, 50% cueing to indicate increased independence with getting out of bed.  (Target: 11/18/15)     PT LONG TERM GOAL #2   Title Pt will perform sit > supine with mod A, 50% cueing to indicate increased independence with getting  into bed.  (11/18/15)     PT LONG TERM GOAL #3   Title Pt will perform level transfer from w/c <> mat table with min A, 25% cueing to decrease caregiver burden with functional transfers.  (11/18/15)     PT LONG TERM GOAL #4   Title Pt will perform dynamic standing >/= 2 consecutive minutes with BUE support and mod, 50% cueing to indicate improved postural stanbility/control.   (11/18/15)     PT LONG TERM GOAL #5   Title Patient  will ambulate x15' with max A of spouse using LRAD to enable pt/spouse to safely walk into/out of home bathroom.  (11/18/15)               Plan - 11/04/15 1426    Clinical Impression Statement Session focused on increasing pt independence with sit <> stand and static standing. Pt able to perform sit <> stand from personal w/c with as little as mod A with BUE support at parallel bars. Also note improved pt ability to self-correct postural alignment with use of mirror for visual feedback. Transfers and standing continue to be limited by LLE flexor tone and reflexive LE movement ("bouncing") with endrange B hip/knee extension.   Rehab Potential Good   Clinical Impairments Affecting Rehab Potential Positive: strong family/social support; negative: chronicity of condition   PT Frequency 3x / week   PT Duration 8 weeks   PT Treatment/Interventions ADLs/Self Care Home Management;DME Instruction;Gait training;Stair training;Functional mobility training;Therapeutic activities;Patient/family education;Cognitive remediation;Neuromuscular re-education;Therapeutic exercise;Balance training;Orthotic Fit/Training;Wheelchair mobility training;Manual techniques;Splinting;Visual/perceptual remediation/compensation;Vestibular;Passive range of motion   PT Next Visit Plan Emphasize midline alignment, anterior weight shift, and LE activation.   Consulted and Agree with Plan of Care Patient;Family member/caregiver   Family Member Consulted caregiver, [name]      Patient will benefit  from skilled therapeutic intervention in order to improve the following deficits and impairments:  Abnormal gait, Decreased balance, Decreased endurance, Decreased cognition, Cardiopulmonary status limiting activity, Decreased activity tolerance, Decreased coordination, Decreased strength, Impaired flexibility, Impaired tone, Decreased mobility, Decreased skin integrity, Increased muscle spasms, Impaired vision/preception, Decreased range of motion, Impaired UE functional use, Postural dysfunction, Pain  Visit Diagnosis: Other abnormalities of gait and mobility  Unsteadiness on feet  Abnormal posture  Other lack of coordination     Problem List Patient Active Problem List   Diagnosis Date Noted  . 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[place]) 10/20/2015  . Tracheostomy status ([place]) 09/26/2015  . Allergic rhinitis 09/26/2015  . 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 Movement disorder 09/22/2015  . Encephalitis 09/22/2015  . Chest pain 02/07/2014  . Premature atrial contractions 01/13/2014  . Abnormality of gait 12/04/2013  . Hyperlipidemia 12/21/2011  . Cardiovascular risk factor 12/21/2011  . Bunion 01/31/2008  . Metatarsalgia of both feet 09/20/2007  . FLAT FOOT 09/20/2007  . HALLUX RIGIDUS, ACQUIRED 09/20/2007    Billie Ruddy, PT, DPT [facility] [address] [place], [place], [address] Phone: [phone]   Fax:  [phone] 11/04/15, 2:35 PM  Name: Judy Spears MRN: [id number] Date of Birth: Feb 22, 1950

## 2015-11-05 ENCOUNTER — Ambulatory Visit: Payer: BC Managed Care – PPO | Admitting: Occupational Therapy

## 2015-11-05 ENCOUNTER — Encounter
Payer: BC Managed Care – PPO | Attending: Physical Medicine & Rehabilitation | Admitting: Physical Medicine & Rehabilitation

## 2015-11-05 ENCOUNTER — Encounter: Payer: Self-pay | Admitting: Occupational Therapy

## 2015-11-05 ENCOUNTER — Ambulatory Visit: Payer: BC Managed Care – PPO

## 2015-11-05 ENCOUNTER — Encounter: Payer: Self-pay | Admitting: Physical Medicine & Rehabilitation

## 2015-11-05 ENCOUNTER — Ambulatory Visit: Payer: BC Managed Care – PPO | Admitting: Physical Therapy

## 2015-11-05 VITALS — BP 149/86 | HR 76 | Resp 14

## 2015-11-05 DIAGNOSIS — R2681 Unsteadiness on feet: Secondary | ICD-10-CM

## 2015-11-05 DIAGNOSIS — R29810 Facial weakness: Secondary | ICD-10-CM | POA: Insufficient documentation

## 2015-11-05 DIAGNOSIS — R278 Other lack of coordination: Secondary | ICD-10-CM

## 2015-11-05 DIAGNOSIS — M6281 Muscle weakness (generalized): Secondary | ICD-10-CM

## 2015-11-05 DIAGNOSIS — J9690 Respiratory failure, unspecified, unspecified whether with hypoxia or hypercapnia: Secondary | ICD-10-CM | POA: Insufficient documentation

## 2015-11-05 DIAGNOSIS — M25611 Stiffness of right shoulder, not elsewhere classified: Secondary | ICD-10-CM

## 2015-11-05 DIAGNOSIS — R41841 Cognitive communication deficit: Secondary | ICD-10-CM

## 2015-11-05 DIAGNOSIS — M25612 Stiffness of left shoulder, not elsewhere classified: Secondary | ICD-10-CM

## 2015-11-05 DIAGNOSIS — A858 Other specified viral encephalitis: Secondary | ICD-10-CM | POA: Insufficient documentation

## 2015-11-05 DIAGNOSIS — R1310 Dysphagia, unspecified: Secondary | ICD-10-CM | POA: Insufficient documentation

## 2015-11-05 DIAGNOSIS — R269 Unspecified abnormalities of gait and mobility: Secondary | ICD-10-CM | POA: Diagnosis not present

## 2015-11-05 DIAGNOSIS — R2689 Other abnormalities of gait and mobility: Secondary | ICD-10-CM

## 2015-11-05 DIAGNOSIS — R1312 Dysphagia, oropharyngeal phase: Secondary | ICD-10-CM | POA: Diagnosis not present

## 2015-11-05 DIAGNOSIS — R471 Dysarthria and anarthria: Secondary | ICD-10-CM

## 2015-11-05 DIAGNOSIS — R41844 Frontal lobe and executive function deficit: Secondary | ICD-10-CM

## 2015-11-05 DIAGNOSIS — G8250 Quadriplegia, unspecified: Secondary | ICD-10-CM

## 2015-11-05 DIAGNOSIS — R293 Abnormal posture: Secondary | ICD-10-CM

## 2015-11-05 NOTE — Therapy (Signed)
[facility] [address] [place], [place], [address] Phone: [phone]   Fax:  [phone]  Occupational Therapy Treatment  Patient Details  Name: Judy Spears MRN: [id number] Date of Birth: 14-Nov-1950 Referring Provider: Dr. Sharion Dove  Encounter Date: 11/05/2015      OT End of Session - 11/05/15 1210    Visit Number 18   Number of Visits 24   Date for OT Re-Evaluation 11/18/15   Authorization Type BCBS unlimited visits   OT Start Time 1015   OT Stop Time 1100   OT Time Calculation (min) 45 min   Activity Tolerance Patient tolerated treatment well   Behavior During Therapy [facility] for tasks assessed/performed      Past Medical History:  Diagnosis Date  . Arthritis    lt great toe  . Complication of anesthesia    pt has had headaches post op-did advise to hydra well preop  . Hair loss    right-sided  . History of exercise stress test    a. ETT (12/15) with 12:00 exercise, no ST changes, occasional PACs.   . Hyperlipidemia   . Insomnia   . Migraine headache   . 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 Postmenopausal   . Premature atrial contractions    a. Holter (12/15) with 8% PACs, no atrial runs or atrial fibrillation.     Past Surgical History:  Procedure Laterality Date  . BREAST BIOPSY  01/01/2011   Procedure: BREAST BIOPSY WITH NEEDLE LOCALIZATION;  Surgeon: Rolm Bookbinder, MD;  Location: [facility];  Service: General;  Laterality: Left;  left breast wire localization biopsy  . cataracts right eye    . CESAREAN SECTION  1986  . HERNIA REPAIR  2000   RIH  . opirectinal membrane peel    . RHINOPLASTY  1983    There were no vitals filed for this visit.      Subjective Assessment - 11/05/15 1204    Subjective  Patient initially lethargic, but easily aroused and engaged - per caregiver patient has another UTI   Pertinent History see epic snapshot - ABI/hypoxia from [facility]   Patient Stated Goals Pt unable to state.    Currently in Pain? No/denies   Pain Score 0-No pain                      OT Treatments/Exercises (OP) - 11/05/15 0001      Neurological Re-education Exercises   Other Exercises 1 Neuromuscular reeducation to address postural control in sitting.  Worked on active slide board transfer to mat table, and then sitting balance.  Patient with improved ability to maintain midline in sitting and even slight forward flexion.  Patient shows isolated individual hip control to scoot and today after manual facilitation, able to replicate anterior rotation of pelvis folllowed with active thoracic extension (sitting) Cued patient to maintain pelvis rotation and trunk alignment for forward weight shift for sit to squat transition.     Other Exercises 2 In sitting worked on increasing patient's ability to engage eye hand coordination.  Patient now visually attending for several seconds at a time, but still needing initial cue and at times even manual facilitation to orient head, eyes to task.                  OT Education - 11/05/15 1209    Education provided Yes   Education Details  importance of vision to aide in UE use, postural control and midline orientation for sitting,a nd transitional movements   Person(s) Educated Patient;Caregiver(s)   Methods Explanation;Demonstration;Verbal cues;Tactile cues   Comprehension Verbal cues required;Tactile cues required;Need further instruction          OT Short Term Goals - 11/04/15 1254      OT SHORT TERM GOAL #1   Title Pt and husband will be mod I with HEP - 10/21/2015   Status On-going     OT SHORT TERM GOAL #2   Title Pt will demonstrate adequate postural control for breath support to voice at least 3 words   Status Achieved     OT SHORT TERM GOAL #3   Title Pt will transfer to commode with moderate assistance 75% of the time.   Status On-going     OT SHORT TERM GOAL #4    Title Pt will tolerate positional changes as evidenced by respiration rate and motor quieting in preparation for funcitonal mobility   Status Achieved           OT Long Term Goals - 11/04/15 1254      OT LONG TERM GOAL #1   Title Pt and husband will be mod I with upgraded HEP - 11/18/2015   Status On-going     OT LONG TERM GOAL #2   Title Pt will demonstrate adequate postural control to support 5 words in supported sitting.   Status On-going     OT LONG TERM GOAL #3   Title Pt will demonstrate ability for low reach with RUE for functional task with  mod facilitation   Status Achieved     OT LONG TERM GOAL #4   Title Pt will wash face with LUE with min a in supported sitting   Status On-going     OT LONG TERM GOAL #5   Title Pt will release objects with L hand with min faciltation    Status On-going     OT LONG TERM GOAL #6   Title Pt will stand with min a while caregiver manages pants during LB dressing 75% of the time.   Status On-going     OT LONG TERM GOAL #7   Title Pt will transfer to commode with min a 75% of the time.   Status On-going     OT LONG TERM GOAL #8   Title Pt will be able to sit unsupported with supervision 50% of the time while engaged in functional task.   Status On-going               Plan - 11/05/15 1210    Clinical Impression Statement Patient benefits from rote repetition of motor tasks for motor reeducation.  Small variations are tolerated and help to facilitate steady progress toward goals.     Rehab Potential Fair   Clinical Impairments Affecting Rehab Potential severity of deficits, slow rate of recovery   OT Frequency 3x / week   OT Duration 8 weeks   OT Treatment/Interventions Self-care/ADL training;Ultrasound;Moist Heat;Electrical Stimulation;DME and/or AE instruction;Neuromuscular education;Therapeutic exercise;Functional Mobility Training;Manual Therapy;Passive range of motion;Splinting;Therapeutic activities;Balance  training;Patient/family education;Visual/perceptual remediation/compensation;Cognitive remediation/compensation   Plan LE activation for sit to stand and transfers, pelvis rotation and thoracic extension, reaching in sitting and standing, basic grooming   OT Home Exercise Plan Discussed with patient, with husband present, the importance of flexing forward and using legs for transfers and standing   Consulted and Agree with Plan of Care Patient;Family member/caregiver  Family Member Consulted caregiver [name]      Patient will benefit from skilled therapeutic intervention in order to improve the following deficits and impairments:  Decreased endurance, Decreased skin integrity, Impaired vision/preception, Improper body mechanics, Impaired perceived functional ability, Decreased knowledge of precautions, Cardiopulmonary status limiting activity, Decreased activity tolerance, Decreased knowledge of use of DME, Decreased strength, Impaired flexibility, Improper spinal/pelvic alignment, Impaired sensation, Difficulty walking, Decreased mobility, Decreased balance, Decreased cognition, Decreased range of motion, Impaired tone, Pain, Impaired UE functional use, Decreased safety awareness, Decreased coordination  Visit Diagnosis: Abnormal posture  Stiffness of right shoulder, not elsewhere classified  Stiffness of left shoulder, not elsewhere classified  Frontal lobe and executive function deficit  Muscle weakness (generalized)  Unsteadiness on feet  Other lack of coordination    Problem List Patient Active Problem List   Diagnosis Date Noted  . 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[place]) 10/20/2015  . Tracheostomy status ([place]) 09/26/2015  . Allergic rhinitis 09/26/2015  . 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 Movement disorder 09/22/2015  . Encephalitis 09/22/2015  . Chest pain 02/07/2014  . Premature atrial contractions 01/13/2014  . Abnormality of gait 12/04/2013  . Hyperlipidemia 12/21/2011  .  Cardiovascular risk factor 12/21/2011  . Bunion 01/31/2008  . Metatarsalgia of both feet 09/20/2007  . FLAT FOOT 09/20/2007  . HALLUX RIGIDUS, ACQUIRED 09/20/2007    Mariah Milling, OTR/L 11/05/2015, 12:13 PM  [facility] [address] [place], [place], [address] Phone: [phone]   Fax:  [phone]  Name: Judy Spears MRN: [id number] Date of Birth: [birth year]

## 2015-11-05 NOTE — Therapy (Addendum)
[facility] [address] [place], [place], [address] Phone: [phone]   Fax:  [phone]  Speech Language Pathology Treatment  Patient Details  Name: Judy Spears MRN: [id number] Date of Birth: 10-03-1950 Referring Provider: Dr. Alger Simons  Encounter Date: 11/05/2015      End of Session - 11/05/15 1349    Visit Number 19   Number of Visits 24   Date for SLP Re-Evaluation 11/18/15   SLP Start Time 1106   SLP Stop Time  1143   SLP Time Calculation (min) 37 min   Activity Tolerance Patient tolerated treatment well      Past Medical History:  Diagnosis Date  . Arthritis    lt great toe  . Complication of anesthesia    pt has had headaches post op-did advise to hydra well preop  . Hair loss    right-sided  . History of exercise stress test    a. ETT (12/15) with 12:00 exercise, no ST changes, occasional PACs.   . Hyperlipidemia   . Insomnia   . Migraine headache   . 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 Postmenopausal   . Premature atrial contractions    a. Holter (12/15) with 8% PACs, no atrial runs or atrial fibrillation.     Past Surgical History:  Procedure Laterality Date  . BREAST BIOPSY  01/01/2011   Procedure: BREAST BIOPSY WITH NEEDLE LOCALIZATION;  Surgeon: Rolm Bookbinder, MD;  Location: [facility];  Service: General;  Laterality: Left;  left breast wire localization biopsy  . cataracts right eye    . CESAREAN SECTION  1986  . HERNIA REPAIR  2000   RIH  . opirectinal membrane peel    . RHINOPLASTY  1983    There were no vitals filed for this visit.      Subjective Assessment - 11/05/15 1337    Subjective Pt looked at SLP and smiled after SLP greeting.   Patient is accompained by: [name], caregiver               ADULT SLP TREATMENT - 11/05/15 1338      General Information   Behavior/Cognition Cooperative;Pleasant mood;Lethargic;Requires  cueing;Decreased sustained attention;Other (comment)   Patient Positioning Upright in chair     Treatment Provided   Treatment provided Dysphagia;Cognitive-Linquistic     Dysphagia Treatment   Temperature Spikes Noted Yes   Respiratory Status Room air   Oral Cavity - Dentition Adequate natural dentition   Treatment Methods Skilled observation  assessment for readiness for MBSS   Patient observed directly with PO's Yes   Type of PO's observed Ice chips;Thin liquids   Feeding Total assist   Liquids provided via Teaspoon   Oral Phase Signs & Symptoms Anterior loss/spillage;Oral holding   Pharyngeal Phase Signs & Symptoms Suspected delayed swallow initiation;Delayed cough   Other treatment/comments Pt became diaphoretic halfway into session of swallowing/voicing. Eleven presentations and 10 swallows, and 2 appeared with WNL strength. All req'd some sort of max cueing from visual to demo, tactile, and verbal cues. Oral residue noted by anterior leakage 65% of the time.      Cognitive-Linquistic Treatment   Treatment focused on Voice;Dysarthria   Skilled Treatment SLP used hand over hand A for expanding ribcage for larger breaths and also to give pt motoric cue for when to use her voice. She used her voice spontaneous responses/utterances<10% of the time. Words were imitated with pt's voice  15% success. Max verbal cues, visual cues, tactile cues, demo cues used for voicing today however pt had voice in what seemed to be like times that SLP would not assume anything being done in therapy fostered this voice use. SLP used tactlie cues for SLP's throat for pt to feel vocal fold vibration and then SLP held pt's knuckles/fingers to her throat to further cue pt for voicing however pt used whisper in imitation during this time, 95% of thetime.     Assessment / Recommendations / Plan   Plan Continue with current plan of care     Dysphagia Recommendations   Diet recommendations NPO     Progression  Toward Goals   Progression toward goals --  pt appears to have motivation towards goals            SLP Short Term Goals - 11/05/15 1352      SLP SHORT TERM GOAL #1   Title Pt will demonstrate audible phonation with words, vowels, or syllables in 4/10 trials with max A   Time 1   Period Weeks   Status Revised     SLP SHORT TERM GOAL #2   Title Pt will demonstrate breath support with max A, prior to repetition of words 4/10   Time 1   Period Weeks   Status Revised     SLP SHORT TERM GOAL #3   Title Pt will swallow ice chip/H20 via spoon in less than 7 seconds with usual min to mod A 3/5 trials   Time 1   Period Weeks   Status On-going     SLP SHORT TERM GOAL #4   Title Pt will sustain attention to therapy task for over 7 minutes with occasional min A   Time 1   Period Weeks   Status On-going          SLP Long Term Goals - 11/05/15 1352      SLP LONG TERM GOAL #1   Title Pt will demonstrate audible phonation/intelligible speech with 2 word phrases with occasional min A 5/7 utterances   Time 1   Period Weeks   Status On-going     SLP LONG TERM GOAL #2   Title Pt will demonstrate audible cough/throat clear to command and to clear secretions with occasional min A 4/5 trials.    Time 1   Period Weeks   Status On-going     SLP LONG TERM GOAL #3   Title Pt will achieve timely oral phase and swallow with ice chip/ water trials 5/7 trials   Time 1   Period Weeks   Status On-going     SLP LONG TERM GOAL #4   Title Pt will sustain attention to therapy tasks for 12 minutes with occasiona min A   Time 1   Period Weeks   Status On-going          Plan - 11/05/15 1349    Clinical Impression Statement Focus on speech/voicing and swallowing continue. Timewise, pt was able to remain alert for approx 27 of 37 minutes of session. She still has significant deficits in speech and swallowing, and today was more alert and attempted more swallows and speech than previous  weeks. Skilled ST remains needed to decr caregiver burden.    Speech Therapy Frequency 3x / week   Duration --   7 more visits   Treatment/Interventions Aspiration precaution training;Trials of upgraded texture/liquids;Compensatory strategies;Functional tasks;Patient/family education;Cueing hierarchy;Diet toleration management by SLP;Cognitive reorganization;Compensatory techniques;SLP instruction and  feedback;Internal/external aids;Multimodal communcation approach   Potential to Achieve Goals Fair   Potential Considerations Ability to learn/carryover information;Co-morbidities;Severity of impairments   Consulted and Agree with Plan of Care Family member/caregiver   Family Member Consulted [name]- spouse      Patient will benefit from skilled therapeutic intervention in order to improve the following deficits and impairments:   Dysphagia, oropharyngeal phase  Dysarthria and anarthria  Cognitive communication deficit    Problem List Patient Active Problem List   Diagnosis Date Noted  . 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 Tracheostomy status ([place]) 09/26/2015  . Allergic rhinitis 09/26/2015  . 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 Movement disorder 09/22/2015  . Encephalitis 09/22/2015  . Chest pain 02/07/2014  . Premature atrial contractions 01/13/2014  . Abnormality of gait 12/04/2013  . Hyperlipidemia 12/21/2011  . Cardiovascular risk factor 12/21/2011  . Bunion 01/31/2008  . Metatarsalgia of both feet 09/20/2007  . FLAT FOOT 09/20/2007  . HALLUX RIGIDUS, ACQUIRED 09/20/2007    [facility] ,MS, CCC-SLP  11/05/2015, 1:57 PM  [facility] [address] [place], [place], [address] Phone: [phone]   Fax:  [phone]   Name: Judy Spears MRN: [id number] Date of Birth: 02-09-1950

## 2015-11-05 NOTE — Therapy (Signed)
[facility] [address] [place], [place], [address] Phone: [phone]   Fax:  [phone]  Physical Therapy Treatment  Patient Details  Name: Judy Spears MRN: [id number] Date of Birth: 08/10/1950 Referring Provider: Zachary Swartz, MD (Payal Fadia, MD)  Encounter Date: 11/05/2015      PT End of Session - 11/05/15 1751    Visit Number 20   Number of Visits 25   Date for PT Re-Evaluation 11/22/15   Authorization Type BCBS   PT Start Time 0935   PT Stop Time 1015   PT Time Calculation (min) 40 min   Activity Tolerance Patient tolerated treatment well   Behavior During Therapy WFL for tasks assessed/performed      Past Medical History:  Diagnosis Date  . Arthritis    lt great toe  . Complication of anesthesia    pt has had headaches post op-did advise to hydra well preop  . Hair loss    right-sided  . History of exercise stress test    a. ETT (12/15) with 12:00 exercise, no ST changes, occasional PACs.   . Hyperlipidemia   . Insomnia   . Migraine headache   . 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 Postmenopausal   . Premature atrial contractions    a. Holter (12/15) with 8% PACs, no atrial runs or atrial fibrillation.     Past Surgical History:  Procedure Laterality Date  . BREAST BIOPSY  01/01/2011   Procedure: BREAST BIOPSY WITH NEEDLE LOCALIZATION;  Surgeon: Matthew Wakefield, MD;  Location: [facility];  Service: General;  Laterality: Left;  left breast wire localization biopsy  . cataracts right eye    . CESAREAN SECTION  1986  . HERNIA REPAIR  2000   RIH  . opirectinal membrane peel    . RHINOPLASTY  1983    There were no vitals filed for this visit.      Subjective Assessment - 11/05/15 1808    Subjective Pt scheduled with Dr. Swartz today for botox injections in legs.   Pertinent History Precautions: trach (capped), PEG, seizure. 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 No/denies                NMR:  All interventions performed with pt wearing B articulating custom AFO's. Attempted to provide verbal/demo cueing for anterior pelvic tilt during initiation of sit > stand; however, pt with decreased arousal during prolonged sitting. Blocked practice of sit <> stand from personal tilt-in-space w/c (stability cushion) with BUE support at // bars, anterior tibial block (fabric belt secured around // bars) as fulcrum and for increased kinesthetic awareness. Black foam beams (6" width) between feet and 10# weight placed at L ankle to maintain LE alignment due to tone causing increased L knee flexion. For sit > stand,+2A provided (primary PT and OT) to maximize cueing/facilitation, promote maximal pt participation and muscle activation. OT seated anterior to pt (facing pt) providing cueing for full anterior weight shift, for midline postural alignment, and for maximal B hip/knee extensor muscle activation. PT provided manual stabilization of L foot on floor, intermittent manual facilitation at sacrum for maximal B hip extension. Pt able   to stand statically with BUE support at parallel bars with no hands-on assistance for 15-30 seconds prior to fatigue. Transitioned to sit > stand from personal w/c (lower surface height due to cushion removed) with BUE support at outside of parallel bars, no anterior tibial block/fulcrum with foam beam between feet, weight on L ankle, and manual stabilization of LLE as described above for LLE positioning. When performing sit > stand in this manner, pt required mod-max facilitation (counterforce at B anterior tibias, posterior thighs via OT,  PT positioned on either side of pt).                       PT Short Term Goals - 10/29/15 2042      PT SHORT TERM GOAL #1   Title Pt will initiate anterior weight shift sufficient to unweight hips with supervision, 25% cueing in preparation for functional transfer.  (Target date: 10/21/15)   Baseline Can initiate with min A inconsistently with 50% cuing.     Status Partially Met     PT SHORT TERM GOAL #2   Title Pt will perform seated scooting (AP/PA and M/L) with supervision, 25% cueing to increase independence with transfers, repositioning. (10/21/15)   Baseline 10/4: Requires min-mod A, 25-50% cueing from lowest mat table height   Status Partially Met     PT SHORT TERM GOAL #3   Title Pt will tolerate static standing with total A of standing frame x10 minutes with vital signs WNL to improve standing tolerance and for tone management.  (10/21/15)   Baseline met    Status Achieved     PT SHORT TERM GOAL #4   Title Pt will perform dynamic sitting >/= 5 minutes with BLE's supported, intermittent UE support with CGA to indicate improved postural stability/control necessary for functional transfers. (10/21/15)   Status Achieved     PT SHORT TERM GOAL #5   Title Pt will perform sit <> side lying with mod A, 50% cueing to progress toward increased independence with bed mobility.  (10/21/15)   Baseline 9/22: R sidelying > sit with mod A.    Status Achieved     PT SHORT TERM GOAL #6   Title Pt will consistently perform sit <> stand with min A, 25% cueing to indicate increased independence with functional transfers.  (10/21/15)   Baseline 10/4: Continues to require mod-max A    Status Not Met     PT SHORT TERM GOAL #7   Title Pt will perform level transfer from w/c <> mat table with mod A, 50% cueing to decrease caregiver burden with functional transfers.  (10/21/15)   Baseline mod/max A on 10/23/15, max A cues for sequencing and hand placement.    Status Partially Met            PT Long Term Goals - 09/23/15 2120      PT LONG TERM GOAL #1   Title Pt will perform supine > sit with mod A, 50% cueing to indicate increased independence with getting out of bed.  (Target: 11/18/15)     PT LONG TERM GOAL #2   Title Pt will perform sit > supine with mod A, 50% cueing to indicate increased independence with getting into bed.  (11/18/15)     PT LONG TERM GOAL #3   Title Pt will perform level transfer from w/c <> mat table with min A, 25% cueing to decrease caregiver burden with functional transfers.  (11/18/15)       PT LONG TERM GOAL #4   Title Pt will perform dynamic standing >/= 2 consecutive minutes with BUE support and mod, 50% cueing to indicate improved postural stanbility/control.   (11/18/15)     PT LONG TERM GOAL #5   Title Patient will ambulate x15' with max A of spouse using LRAD to enable pt/spouse to safely walk into/out of home bathroom.  (11/18/15)               Plan - 11/05/15 1752    Clinical Impression Statement Session focused on continued transfer training with emphasis on sit <> stand, facilitating full B hip/knee extension during static standing. Pt able to perform static standing with BUE support at parallel bars x15-30 seconds with no hands-on assist. Continues to require manual stabilization of LLE (and/or use of 10# weight) during standing/WB due to LLE tone.   Rehab Potential Good   Clinical Impairments Affecting Rehab Potential Positive: strong family/social support; negative: chronicity of condition   PT Frequency 3x / week   PT Duration 8 weeks   PT Treatment/Interventions ADLs/Self Care Home Management;DME Instruction;Gait training;Stair training;Functional mobility training;Therapeutic activities;Patient/family education;Cognitive remediation;Neuromuscular re-education;Therapeutic exercise;Balance training;Orthotic Fit/Training;Wheelchair mobility training;Manual techniques;Splinting;Visual/perceptual  remediation/compensation;Vestibular;Passive range of motion   PT Next Visit Plan ** Educate caregiver(s) on home stretching program (hamstrings, adductors) due to Botox injections on 10/11. Emphasize midline alignment, anterior weight shift, and LE activation.    Consulted and Agree with Plan of Care Patient;Family member/caregiver   Family Member Consulted caregiver, [name]      Patient will benefit from skilled therapeutic intervention in order to improve the following deficits and impairments:  Abnormal gait, Decreased balance, Decreased endurance, Decreased cognition, Cardiopulmonary status limiting activity, Decreased activity tolerance, Decreased coordination, Decreased strength, Impaired flexibility, Impaired tone, Decreased mobility, Decreased skin integrity, Increased muscle spasms, Impaired vision/preception, Decreased range of motion, Impaired UE functional use, Postural dysfunction, Pain  Visit Diagnosis: Other abnormalities of gait and mobility  Abnormal posture  Muscle weakness (generalized)  Unsteadiness on feet  Other lack of coordination     Problem List Patient Active Problem List   Diagnosis Date Noted  . 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 Tracheostomy status ([place]) 09/26/2015  . Allergic rhinitis 09/26/2015  . 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 Movement disorder 09/22/2015  . Encephalitis 09/22/2015  . Chest pain 02/07/2014  . Premature atrial contractions 01/13/2014  . Abnormality of gait 12/04/2013  . Hyperlipidemia 12/21/2011  . Cardiovascular risk factor 12/21/2011  . Bunion 01/31/2008  . Metatarsalgia of both feet 09/20/2007  . FLAT FOOT 09/20/2007  . HALLUX RIGIDUS, ACQUIRED 09/20/2007   Billie Ruddy, PT, DPT [facility] [address] [place], [place], [address] Phone: [phone]   Fax:  [phone] 11/05/15, 6:09 PM  Name: TAYLIN MANS MRN: [id number] Date of Birth: 10/18/1950

## 2015-11-05 NOTE — Patient Instructions (Signed)
PLEASE CALL ME WITH ANY PROBLEMS OR QUESTIONS ([phone])  

## 2015-11-05 NOTE — Progress Notes (Signed)
Botox Injection for spasticity using needle EMG guidance Indication: 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HCC)   Dilution: 100 Units/ml        Total Units Injected: 400 Indication: Severe spasticity which interferes with ADL,mobility and/or  hygiene and is unresponsive to medication management and other conservative care Informed consent was obtained after describing risks and benefits of the procedure with the patient. This includes bleeding, bruising, infection, excessive weakness, or medication side effects. A REMS form is on file and signed.  Needle: 83mm injectable monopolar needle electrode  Number of units per muscle  Quadriceps 0 units Gastroc/soleus 0 units Hamstrings 75 units left 3 access points and 125 units right 4 access points Bilateral Hip adductors: 100 units each 3 access points each  Tibialis Posterior 0 units EHL 0 units All injections were done after obtaining appropriate EMG activity and after negative drawback for blood. The patient tolerated the procedure well. Post procedure instructions were given. Return in about 2 months (around 01/05/2016).

## 2015-11-10 ENCOUNTER — Ambulatory Visit: Payer: BC Managed Care – PPO | Admitting: Occupational Therapy

## 2015-11-10 ENCOUNTER — Ambulatory Visit: Payer: BC Managed Care – PPO | Admitting: Physical Therapy

## 2015-11-10 ENCOUNTER — Encounter: Payer: BC Managed Care – PPO | Admitting: Occupational Therapy

## 2015-11-10 ENCOUNTER — Ambulatory Visit: Payer: BC Managed Care – PPO | Admitting: *Deleted

## 2015-11-10 ENCOUNTER — Encounter: Payer: Self-pay | Admitting: Occupational Therapy

## 2015-11-10 DIAGNOSIS — R41844 Frontal lobe and executive function deficit: Secondary | ICD-10-CM

## 2015-11-10 DIAGNOSIS — M6281 Muscle weakness (generalized): Secondary | ICD-10-CM

## 2015-11-10 DIAGNOSIS — M25652 Stiffness of left hip, not elsewhere classified: Secondary | ICD-10-CM

## 2015-11-10 DIAGNOSIS — M25612 Stiffness of left shoulder, not elsewhere classified: Secondary | ICD-10-CM

## 2015-11-10 DIAGNOSIS — R278 Other lack of coordination: Secondary | ICD-10-CM

## 2015-11-10 DIAGNOSIS — M25611 Stiffness of right shoulder, not elsewhere classified: Secondary | ICD-10-CM

## 2015-11-10 DIAGNOSIS — R1312 Dysphagia, oropharyngeal phase: Secondary | ICD-10-CM | POA: Diagnosis not present

## 2015-11-10 DIAGNOSIS — M25651 Stiffness of right hip, not elsewhere classified: Secondary | ICD-10-CM

## 2015-11-10 DIAGNOSIS — R293 Abnormal posture: Secondary | ICD-10-CM

## 2015-11-10 DIAGNOSIS — R471 Dysarthria and anarthria: Secondary | ICD-10-CM

## 2015-11-10 DIAGNOSIS — R2689 Other abnormalities of gait and mobility: Secondary | ICD-10-CM

## 2015-11-10 DIAGNOSIS — R2681 Unsteadiness on feet: Secondary | ICD-10-CM

## 2015-11-10 NOTE — Therapy (Signed)
[facility] [address] [place], [place], [address] Phone: [phone]   Fax:  [phone]  Speech Language Pathology Treatment  Patient Details  Name: Judy Spears MRN: [id number] Date of Birth: 01-19-1951 Referring Provider: Dr. Alger Simons  Encounter Date: 11/10/2015      End of Session - 11/10/15 1602    Visit Number 20   Number of Visits 24   Date for SLP Re-Evaluation 11/18/15   SLP Start Time 0930   SLP Stop Time  1013   SLP Time Calculation (min) 43 min   Activity Tolerance Patient tolerated treatment well      Past Medical History:  Diagnosis Date  . Arthritis    lt great toe  . Complication of anesthesia    pt has had headaches post op-did advise to hydra well preop  . Hair loss    right-sided  . History of exercise stress test    a. ETT (12/15) with 12:00 exercise, no ST changes, occasional PACs.   . Hyperlipidemia   . Insomnia   . Migraine headache   . 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 Postmenopausal   . Premature atrial contractions    a. Holter (12/15) with 8% PACs, no atrial runs or atrial fibrillation.     Past Surgical History:  Procedure Laterality Date  . BREAST BIOPSY  01/01/2011   Procedure: BREAST BIOPSY WITH NEEDLE LOCALIZATION;  Surgeon: Rolm Bookbinder, MD;  Location: [facility];  Service: General;  Laterality: Left;  left breast wire localization biopsy  . cataracts right eye    . CESAREAN SECTION  1986  . HERNIA REPAIR  2000   RIH  . opirectinal membrane peel    . RHINOPLASTY  1983    There were no vitals filed for this visit.      Subjective Assessment - 11/10/15 1601    Subjective Pt bright and interactive.   Patient is accompained by: Family member  caregiver and spouse               ADULT SLP TREATMENT - 11/10/15 1014      General Information   Behavior/Cognition Alert;Cooperative;Pleasant mood   Patient Positioning  Upright in chair     Treatment Provided   Treatment provided Dysphagia;Cognitive-Linquistic     Dysphagia Treatment   Temperature Spikes Noted No   Respiratory Status Room air   Treatment Methods Skilled observation   Patient observed directly with PO's Yes   Type of PO's observed Ice chips   Feeding Needs assist   Liquids provided via Teaspoon   Oral Phase Signs & Symptoms Anterior loss/spillage;Oral holding   Pharyngeal Phase Signs & Symptoms Suspected delayed swallow initiation   Type of cueing Verbal;Tactile;Visual   Amount of cueing Maximal     Pain Assessment   Pain Assessment Faces   Faces Pain Scale No hurt     Cognitive-Linquistic Treatment   Treatment focused on Voice;Dysarthria   Skilled Treatment Pt able to generate voicing in ~20% of responses. Intelligibility limited, but improved with context and close ended responses.      Assessment / Recommendations / Plan   Plan Continue with current plan of care     Progression Toward Goals   Progression toward goals Progressing toward goals            SLP Short Term Goals - 11/10/15 1605      SLP SHORT TERM GOAL #1  Title Pt will demonstrate audible phonation with words, vowels, or syllables in 4/10 trials with max A   Status On-going     SLP SHORT TERM GOAL #2   Title Pt will demonstrate breath support with max A, prior to repetition of words 4/10   Status On-going     SLP SHORT TERM GOAL #3   Title Pt will swallow ice chip/H20 via spoon in less than 7 seconds with usual min to mod A 3/5 trials   Status On-going     SLP SHORT TERM GOAL #4   Title Pt will sustain attention to therapy task for over 7 minutes with occasional min A   Status On-going          SLP Long Term Goals - 11/05/15 1352      SLP LONG TERM GOAL #1   Title Pt will demonstrate audible phonation/intelligible speech with 2 word phrases with occasional min A 5/7 utterances   Time 1   Period Weeks   Status On-going     SLP LONG  TERM GOAL #2   Title Pt will demonstrate audible cough/throat clear to command and to clear secretions with occasional min A 4/5 trials.    Time 1   Period Weeks   Status On-going     SLP LONG TERM GOAL #3   Title Pt will achieve timely oral phase and swallow with ice chip/ water trials 5/7 trials   Time 1   Period Weeks   Status On-going     SLP LONG TERM GOAL #4   Title Pt will sustain attention to therapy tasks for 12 minutes with occasiona min A   Time 1   Period Weeks   Status On-going          Plan - 11/10/15 1603    Clinical Impression Statement Focus on speech/voicing and swallowing continue. Pt was able to remain alert for entire session. Persistent significant deficits in speech and swallowing. Skilled SLP services warranted to decrease caregiver burden by maximizing pt function.   Speech Therapy Frequency 3x / week   Treatment/Interventions Aspiration precaution training;Trials of upgraded texture/liquids;Compensatory strategies;Functional tasks;Patient/family education;Cueing hierarchy;Diet toleration management by SLP;Cognitive reorganization;Compensatory techniques;SLP instruction and feedback;Internal/external aids;Multimodal communcation approach   Potential to Achieve Goals Fair   Potential Considerations Ability to learn/carryover information;Co-morbidities;Severity of impairments      Patient will benefit from skilled therapeutic intervention in order to improve the following deficits and impairments:   Dysphagia, oropharyngeal phase  Dysarthria and anarthria    Problem List Patient Active Problem List   Diagnosis Date Noted  . 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[facility]) 10/20/2015  . Tracheostomy status ([facility]) 09/26/2015  . Allergic rhinitis 09/26/2015  . 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 Movement disorder 09/22/2015  . Encephalitis 09/22/2015  . Chest pain 02/07/2014  . Premature atrial contractions 01/13/2014  . Abnormality of gait 12/04/2013  . Hyperlipidemia  12/21/2011  . Cardiovascular risk factor 12/21/2011  . Bunion 01/31/2008  . Metatarsalgia of both feet 09/20/2007  . FLAT FOOT 09/20/2007  . HALLUX RIGIDUS, ACQUIRED 09/20/2007    Vinetta Bergamo MA, [place] 11/10/2015, 4:06 PM  [facility] [address] [place], [place], [address] Phone: [phone]   Fax:  [phone]   Name: Judy Spears MRN: [id number] Date of Birth: Oct 08, 1950

## 2015-11-10 NOTE — Therapy (Signed)
[facility] [address] [place], [place], [address] Phone: [phone]   Fax:  [phone]  Occupational Therapy Treatment  Patient Details  Name: Judy Spears MRN: [id number] Date of Birth: 1950/07/15 Referring Provider: Dr. Sharion Dove  Encounter Date: 11/10/2015      OT End of Session - 11/10/15 0948    Visit Number 19   Number of Visits 24   Date for OT Re-Evaluation 11/18/15   Authorization Type [employer] unlimited visits   OT Start Time 0902   OT Stop Time 0930  Patient arrived late- weather, traffic   OT Time Calculation (min) 28 min   Activity Tolerance Patient tolerated treatment well   Behavior During Therapy [facility] for tasks assessed/performed      Past Medical History:  Diagnosis Date  . Arthritis    lt great toe  . Complication of anesthesia    pt has had headaches post op-did advise to hydra well preop  . Hair loss    right-sided  . History of exercise stress test    a. ETT (12/15) with 12:00 exercise, no ST changes, occasional PACs.   . Hyperlipidemia   . Insomnia   . Migraine headache   . 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 Postmenopausal   . Premature atrial contractions    a. Holter (12/15) with 8% PACs, no atrial runs or atrial fibrillation.     Past Surgical History:  Procedure Laterality Date  . BREAST BIOPSY  01/01/2011   Procedure: BREAST BIOPSY WITH NEEDLE LOCALIZATION;  Surgeon: Rolm Bookbinder, MD;  Location: [facility];  Service: General;  Laterality: Left;  left breast wire localization biopsy  . cataracts right eye    . CESAREAN SECTION  1986  . HERNIA REPAIR  2000   RIH  . opirectinal membrane peel    . RHINOPLASTY  1983    There were no vitals filed for this visit.      Subjective Assessment - 11/10/15 0941    Subjective  Patient more lethargic today - husband indicates that she did not sleep well last night.     Pertinent History see epic  snapshot - ABI/hypoxia from tic bite/ virus   Patient Stated Goals Pt unable to state.    Currently in Pain? No/denies   Pain Score 0-No pain                      OT Treatments/Exercises (OP) - 11/10/15 0001      Neurological Re-education Exercises   Other Exercises 1 Neuromuscular reeducation to address increased participation in level surface transfers using a slide board.  Patient needed max assistance to achieve forward posturr in prep for transfer.  Initially very stiff into extensor patterning.  Patient with increased adductor tone in BLE's while flexing forward, but with cueing for deep breathing, able to immediately reduce adductor tension effectively.  Patient able to initiate partial weight shift and lower trunk initiated scoot, but lacked sufficient force (and attention) to complete effective transfer. without max to total assistance.  In sitting worked on isolated pelvis rotation anterior, posterior with subsequent thoracic extension and flexion.  Demonstrated this motion for husband and caregiver to reinforce as exercise at edge of seat at home.                  OT Education - 11/10/15 [phone]    Education provided Yes   Education Details lower  trunk initiated movement - anterior/ posterior pelvis rotation   Person(s) Educated Patient;Spouse;Caregiver(s)   Methods Explanation;Demonstration   Comprehension Verbalized understanding;Need further instruction          OT Short Term Goals - 11/04/15 1254      OT SHORT TERM GOAL #1   Title Pt and husband will be mod I with HEP - 10/21/2015   Status On-going     OT SHORT TERM GOAL #2   Title Pt will demonstrate adequate postural control for breath support to voice at least 3 words   Status Achieved     OT SHORT TERM GOAL #3   Title Pt will transfer to commode with moderate assistance 75% of the time.   Status On-going     OT SHORT TERM GOAL #4   Title Pt will tolerate positional changes as evidenced by  respiration rate and motor quieting in preparation for funcitonal mobility   Status Achieved           OT Long Term Goals - 11/04/15 1254      OT LONG TERM GOAL #1   Title Pt and husband will be mod I with upgraded HEP - 11/18/2015   Status On-going     OT LONG TERM GOAL #2   Title Pt will demonstrate adequate postural control to support 5 words in supported sitting.   Status On-going     OT LONG TERM GOAL #3   Title Pt will demonstrate ability for low reach with RUE for functional task with  mod facilitation   Status Achieved     OT LONG TERM GOAL #4   Title Pt will wash face with LUE with min a in supported sitting   Status On-going     OT LONG TERM GOAL #5   Title Pt will release objects with L hand with min faciltation    Status On-going     OT LONG TERM GOAL #6   Title Pt will stand with min a while caregiver manages pants during LB dressing 75% of the time.   Status On-going     OT LONG TERM GOAL #7   Title Pt will transfer to commode with min a 75% of the time.   Status On-going     OT LONG TERM GOAL #8   Title Pt will be able to sit unsupported with supervision 50% of the time while engaged in functional task.   Status On-going               Plan - 11/10/15 0949    Clinical Impression Statement Patient benefits from rote repetition of motor tasks for motor reeducation - tolerates small variations to progress toward goals   Rehab Potential Fair   Clinical Impairments Affecting Rehab Potential severity of deficits, slow rate of recovery   OT Frequency 3x / week   OT Duration 8 weeks   OT Treatment/Interventions Self-care/ADL training;Ultrasound;Moist Heat;Electrical Stimulation;DME and/or AE instruction;Neuromuscular education;Therapeutic exercise;Functional Mobility Training;Manual Therapy;Passive range of motion;Splinting;Therapeutic activities;Balance training;Patient/family education;Visual/perceptual remediation/compensation;Cognitive  remediation/compensation   Plan LE activation for sit to stand and transfers, pelvis rotation with thoracic extension / flexion, reacfhing in sitting and standing   OT Home Exercise Plan Discussed with patient, with husband present, the importance of flexing forward and using legs for transfers and standing   Consulted and Agree with Plan of Care Patient;Family member/caregiver   Family Member Consulted caregiver [name], husband - [name]      Patient will benefit from skilled therapeutic intervention in  order to improve the following deficits and impairments:  Decreased endurance, Decreased skin integrity, Impaired vision/preception, Improper body mechanics, Impaired perceived functional ability, Decreased knowledge of precautions, Cardiopulmonary status limiting activity, Decreased activity tolerance, Decreased knowledge of use of DME, Decreased strength, Impaired flexibility, Improper spinal/pelvic alignment, Impaired sensation, Difficulty walking, Decreased mobility, Decreased balance, Decreased cognition, Decreased range of motion, Impaired tone, Pain, Impaired UE functional use, Decreased safety awareness, Decreased coordination  Visit Diagnosis: Abnormal posture  Stiffness of right shoulder, not elsewhere classified  Stiffness of left shoulder, not elsewhere classified  Frontal lobe and executive function deficit  Muscle weakness (generalized)  Unsteadiness on feet  Other lack of coordination    Problem List Patient Active Problem List   Diagnosis Date Noted  . 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 Tracheostomy status ([facility]) 09/26/2015  . Allergic rhinitis 09/26/2015  . 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 Movement disorder 09/22/2015  . Encephalitis 09/22/2015  . Chest pain 02/07/2014  . Premature atrial contractions 01/13/2014  . Abnormality of gait 12/04/2013  . Hyperlipidemia 12/21/2011  . Cardiovascular risk factor 12/21/2011  . Bunion 01/31/2008  . Metatarsalgia of both  feet 09/20/2007  . FLAT FOOT 09/20/2007  . HALLUX RIGIDUS, ACQUIRED 09/20/2007    Mariah Milling, OTR/L 11/10/2015, 9:52 AM  [facility] [address] [place], [place], [address] Phone: [phone]   Fax:  [phone]  Name: Judy Spears MRN: [id number] Date of Birth: 04/27/1950

## 2015-11-10 NOTE — Therapy (Signed)
[facility] [address] [place], [place], [address] Phone: [phone]   Fax:  [phone]  Physical Therapy Treatment  Patient Details  Name: Judy Spears MRN: [id number] Date of Birth: 1950/09/10 Referring Provider: Alger Simons, MD Sharion Dove, MD)  Encounter Date: 11/10/2015      PT End of Session - 11/10/15 1903    Visit Number 21   Number of Visits 25   Date for PT Re-Evaluation 11/22/15   Authorization Type BCBS   PT Start Time 1451   PT Stop Time 1536   PT Time Calculation (min) 45 min   Activity Tolerance Patient tolerated treatment well   Behavior During Therapy [facility] for tasks assessed/performed      Past Medical History:  Diagnosis Date  . Arthritis    lt great toe  . Complication of anesthesia    pt has had headaches post op-did advise to hydra well preop  . Hair loss    right-sided  . History of exercise stress test    a. ETT (12/15) with 12:00 exercise, no ST changes, occasional PACs.   . Hyperlipidemia   . Insomnia   . Migraine headache   . 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 Postmenopausal   . Premature atrial contractions    a. Holter (12/15) with 8% PACs, no atrial runs or atrial fibrillation.     Past Surgical History:  Procedure Laterality Date  . BREAST BIOPSY  01/01/2011   Procedure: BREAST BIOPSY WITH NEEDLE LOCALIZATION;  Surgeon: Rolm Bookbinder, MD;  Location: [place];  Service: General;  Laterality: Left;  left breast wire localization biopsy  . cataracts right eye    . CESAREAN SECTION  1986  . HERNIA REPAIR  2000   RIH  . opirectinal membrane peel    . RHINOPLASTY  1983    There were no vitals filed for this visit.      Subjective Assessment - 11/10/15 1820    Subjective Pt arrived accompanied by caregiver, [name], and husband, [name]. Per caregiver, pt planned to have trach removed this Wednesday (10/18). Caregiver also reports pt  "never lays flat at home because she has tube feedings all night."   Patient is accompained by: Family member  husband, [name]; caregiver, [name]   Pertinent History Precautions: trach (capped), PEG, seizure. 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 No/denies       NMR: Performed lateral scooting transfer from w/c >mat table (lower than w/c) with slide board, use of 4" step under B feet with total A, max multimodal cueing for full anterior weight shift, manual placement of BUE's; however, pt with minimal to no initiation of lateral scooting on slide board. Performed sit > supine (with goal of educating caregivers on supine caregiver-assisted PROM of BLE's) with max A-total A for BLE management and to transition from R forearm propping > R sidelying. Once in supine, pt with overt signs of discomfort. Therefore, transitioned to semi reclined (head elevated >30 degrees); however, pt continuously attempting to use LLE to bridge to scoot toward EOM (? L hip discomfort). Therefore, transitioned from supine > sit with max A for BLE management and to maintain  anterior weight shift during transition from R sidelying > sit. Donned B custom articulating AFO's, then performed blocked practice of sit <> stand from neutral height mat table with assist of 2 PT's to maximize cueing, facilitate LE activation. In standing, pt engaged in task involving use of LUE to move clothespins from vertical bar (to L of midline) to basket (midline). During dynamic standing task, PT's provided max manual facilitation (counterforce at posterior thighs, anterior tibias) to promote full B hip/knee extension with pt within grossly 30 degrees of neutral  hip/knee extension bilaterally; manual facilitation to promote active elongation of trunk on L and midline head position. Performed squat pivot transfer from mat table > w/c with total A for time management. Once pt seated in w/c, pt educated caregiver on prolonged, passive stretch of B hamstrings, hip adductors x30-45 seconds each, bilaterally. See Pt Instructions for details.                          PT Education - 11/10/15 1902    Education provided Yes   Education Details HEP: passive stretching of B hamstrings, adductors for spasticity management.    Person(s) Educated Patient;Spouse;Caregiver(s)   Methods Explanation;Demonstration;Verbal cues   Comprehension Verbalized understanding;Returned demonstration          PT Short Term Goals - 10/29/15 2042      PT SHORT TERM GOAL #1   Title Pt will initiate anterior weight shift sufficient to unweight hips with supervision, 25% cueing in preparation for functional transfer.  (Target date: 10/21/15)   Baseline Can initiate with min A inconsistently with 50% cuing.     Status Partially Met     PT SHORT TERM GOAL #2   Title Pt will perform seated scooting (AP/PA and M/L) with supervision, 25% cueing to increase independence with transfers, repositioning. (10/21/15)   Baseline 10/4: Requires min-mod A, 25-50% cueing from lowest mat table height   Status Partially Met     PT SHORT TERM GOAL #3   Title Pt will tolerate static standing with total A of standing frame x10 minutes with vital signs WNL to improve standing tolerance and for tone management.  (10/21/15)   Baseline met    Status Achieved     PT SHORT TERM GOAL #4   Title Pt will perform dynamic sitting >/= 5 minutes with BLE's supported, intermittent UE support with CGA to indicate improved postural stability/control necessary for functional transfers. (10/21/15)   Status Achieved     PT SHORT TERM GOAL #5   Title Pt will perform sit <> side lying with mod A, 50%  cueing to progress toward increased independence with bed mobility.  (10/21/15)   Baseline 9/22: R sidelying > sit with mod A.    Status Achieved     PT SHORT TERM GOAL #6   Title Pt will consistently perform sit <> stand with min A, 25% cueing to indicate increased independence with functional transfers.  (10/21/15)   Baseline 10/4: Continues to require mod-max A    Status Not Met     PT SHORT TERM GOAL #7   Title Pt will perform level transfer from w/c <> mat table with mod A, 50% cueing to decrease caregiver burden with functional transfers.  (10/21/15)   Baseline mod/max A on 10/23/15, max A cues for sequencing and hand placement.    Status Partially Met           PT Long Term Goals - 09/23/15  2120      PT LONG TERM GOAL #1   Title Pt will perform supine > sit with mod A, 50% cueing to indicate increased independence with getting out of bed.  (Target: 11/18/15)     PT LONG TERM GOAL #2   Title Pt will perform sit > supine with mod A, 50% cueing to indicate increased independence with getting into bed.  (11/18/15)     PT LONG TERM GOAL #3   Title Pt will perform level transfer from w/c <> mat table with min A, 25% cueing to decrease caregiver burden with functional transfers.  (11/18/15)     PT LONG TERM GOAL #4   Title Pt will perform dynamic standing >/= 2 consecutive minutes with BUE support and mod, 50% cueing to indicate improved postural stanbility/control.   (11/18/15)     PT LONG TERM GOAL #5   Title Patient will ambulate x15' with max A of spouse using LRAD to enable pt/spouse to safely walk into/out of home bathroom.  (11/18/15)               Plan - 11/10/15 1904    Clinical Impression Statement Session focused on NMR with emphasis on LE activation in standing. Also educated husband and caregiver on home stretching program for spasticity management. Pt tolerated interventions with minimal overt signs of discomfort. Pt continues to demo limited tolerance to lying  supine.    Rehab Potential Good   Clinical Impairments Affecting Rehab Potential Positive: strong family/social support; negative: chronicity of condition   PT Frequency 3x / week   PT Duration 8 weeks   PT Treatment/Interventions ADLs/Self Care Home Management;DME Instruction;Gait training;Stair training;Functional mobility training;Therapeutic activities;Patient/family education;Cognitive remediation;Neuromuscular re-education;Therapeutic exercise;Balance training;Orthotic Fit/Training;Wheelchair mobility training;Manual techniques;Splinting;Visual/perceptual remediation/compensation;Vestibular;Passive range of motion   PT Next Visit Plan Em, please provide paper handout for stretching HEP (I ran out of time for this at last session but created one later - it's in Pt Instructions from 10/16 visit) and please teach [name] hip IR stretch. Read over the recommendations I wrote at the bottom of the HEP and, if you agree, please also give this to caregivers.   Consulted and Agree with Plan of Care Patient;Family member/caregiver   Family Member Consulted caregiver, [name] and husband, [name]      Patient will benefit from skilled therapeutic intervention in order to improve the following deficits and impairments:  Abnormal gait, Decreased balance, Decreased endurance, Decreased cognition, Cardiopulmonary status limiting activity, Decreased activity tolerance, Decreased coordination, Decreased strength, Impaired flexibility, Impaired tone, Decreased mobility, Decreased skin integrity, Increased muscle spasms, Impaired vision/preception, Decreased range of motion, Impaired UE functional use, Postural dysfunction, Pain  Visit Diagnosis: Other abnormalities of gait and mobility  Stiffness of left hip, not elsewhere classified  Stiffness of right hip, not elsewhere classified  Other lack of coordination  Abnormal posture     Problem List Patient Active Problem List   Diagnosis Date Noted  . Spastic  tetraplegia ([place]) 10/20/2015  . Tracheostomy status ([place]) 09/26/2015  . Allergic rhinitis 09/26/2015  . 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 Movement disorder 09/22/2015  . Encephalitis 09/22/2015  . Chest pain 02/07/2014  . Premature atrial contractions 01/13/2014  . Abnormality of gait 12/04/2013  . Hyperlipidemia 12/21/2011  . Cardiovascular risk factor 12/21/2011  . Bunion 01/31/2008  . Metatarsalgia of both feet 09/20/2007  . FLAT FOOT 09/20/2007  . HALLUX RIGIDUS, ACQUIRED 09/20/2007    Billie Ruddy, PT, [facility] [address]  [place], [place], [address] Phone: [phone]   Fax:  [phone] 11/10/15, 7:10 PM  Name: LEIDA LUTON MRN: [id number] Date of Birth: Jan 31, 1950

## 2015-11-10 NOTE — Patient Instructions (Addendum)
Hamstring stretch      Place foot on a low step/surface (surface height in leftmost picture for LEFT leg; surface height on rightmost picture for RIGHT leg), such that [name]'s knee is straight. Goal: gentle stretch of back of leg (hamstrings) [name] is able to tolerate for __30-45__ seconds. Perform __4__ reps per set, __2__ sets per day on each leg. To progress: progressively elevate surface height, as tolerated.   Hip adductor stretch   It may be easiest to perform this stretch immediately after the hamstring stretch above. With [name] seated in wheelchair, foot on stool/step, and knee completely straight, move the stool outward so a stretch will occur on the inner thigh. Goal: gentle stretch of inner thigh [name] is able to tolerate for __30-45__ seconds. Perform __4__ reps per set, __2__ sets per day on each leg. To progress: progressively move surface further outward, as tolerated.   Seated Hip Internal Rotation Stretch   Seated in wheelchair with legs apart, caregiver uses hand to gentle move knee inward, as pictured. Hold for  __30-45__ seconds. Perform __4__ reps per set, __2__ sets per day on each leg.  Other recommendations: - Have [name] start lying on her back for 10-15 consecutive minutes daily. She may initially tolerate lying with head of bed elevated 30 degrees. Progress by 5 degrees every 2-3 days, as tolerated.  - Orient [name]'s wheelchair such that she must turn her head to the right. For example, place television, computer screen, or anything else she likes to attend to/engage in on her right side. This will get her neck more mobile.

## 2015-11-12 ENCOUNTER — Encounter: Payer: Self-pay | Admitting: Occupational Therapy

## 2015-11-12 ENCOUNTER — Ambulatory Visit: Payer: BC Managed Care – PPO | Admitting: Occupational Therapy

## 2015-11-12 ENCOUNTER — Ambulatory Visit: Payer: BC Managed Care – PPO

## 2015-11-12 ENCOUNTER — Ambulatory Visit: Payer: BC Managed Care – PPO | Admitting: Physical Therapy

## 2015-11-12 ENCOUNTER — Ambulatory Visit (HOSPITAL_COMMUNITY)
Admission: RE | Admit: 2015-11-12 | Discharge: 2015-11-12 | Disposition: A | Discharge status: Home / Self Care | Payer: BC Managed Care – PPO | Source: Ambulatory Visit | Attending: Acute Care | Admitting: Acute Care

## 2015-11-12 DIAGNOSIS — G4700 Insomnia, unspecified: Secondary | ICD-10-CM | POA: Insufficient documentation

## 2015-11-12 DIAGNOSIS — R278 Other lack of coordination: Secondary | ICD-10-CM

## 2015-11-12 DIAGNOSIS — R293 Abnormal posture: Secondary | ICD-10-CM

## 2015-11-12 DIAGNOSIS — M1990 Unspecified osteoarthritis, unspecified site: Secondary | ICD-10-CM | POA: Insufficient documentation

## 2015-11-12 DIAGNOSIS — R1310 Dysphagia, unspecified: Secondary | ICD-10-CM | POA: Insufficient documentation

## 2015-11-12 DIAGNOSIS — M25611 Stiffness of right shoulder, not elsewhere classified: Secondary | ICD-10-CM

## 2015-11-12 DIAGNOSIS — R2689 Other abnormalities of gait and mobility: Secondary | ICD-10-CM

## 2015-11-12 DIAGNOSIS — R1312 Dysphagia, oropharyngeal phase: Secondary | ICD-10-CM | POA: Diagnosis not present

## 2015-11-12 DIAGNOSIS — G2589 Other specified extrapyramidal and movement disorders: Secondary | ICD-10-CM | POA: Insufficient documentation

## 2015-11-12 DIAGNOSIS — Z79899 Other long term (current) drug therapy: Secondary | ICD-10-CM | POA: Insufficient documentation

## 2015-11-12 DIAGNOSIS — M6281 Muscle weakness (generalized): Secondary | ICD-10-CM

## 2015-11-12 DIAGNOSIS — R2681 Unsteadiness on feet: Secondary | ICD-10-CM

## 2015-11-12 DIAGNOSIS — Z430 Encounter for attention to tracheostomy: Secondary | ICD-10-CM | POA: Insufficient documentation

## 2015-11-12 DIAGNOSIS — Z930 Tracheostomy status: Secondary | ICD-10-CM | POA: Insufficient documentation

## 2015-11-12 DIAGNOSIS — J398 Other specified diseases of upper respiratory tract: Secondary | ICD-10-CM

## 2015-11-12 DIAGNOSIS — A86 Unspecified viral encephalitis: Secondary | ICD-10-CM | POA: Diagnosis not present

## 2015-11-12 DIAGNOSIS — Z8661 Personal history of infections of the central nervous system: Secondary | ICD-10-CM | POA: Diagnosis not present

## 2015-11-12 DIAGNOSIS — R471 Dysarthria and anarthria: Secondary | ICD-10-CM

## 2015-11-12 DIAGNOSIS — J9611 Chronic respiratory failure with hypoxia: Secondary | ICD-10-CM | POA: Diagnosis not present

## 2015-11-12 DIAGNOSIS — R41844 Frontal lobe and executive function deficit: Secondary | ICD-10-CM

## 2015-11-12 DIAGNOSIS — M25612 Stiffness of left shoulder, not elsewhere classified: Secondary | ICD-10-CM

## 2015-11-12 DIAGNOSIS — Z780 Asymptomatic menopausal state: Secondary | ICD-10-CM | POA: Insufficient documentation

## 2015-11-12 DIAGNOSIS — E785 Hyperlipidemia, unspecified: Secondary | ICD-10-CM | POA: Diagnosis not present

## 2015-11-12 DIAGNOSIS — G43909 Migraine, unspecified, not intractable, without status migrainosus: Secondary | ICD-10-CM | POA: Insufficient documentation

## 2015-11-12 DIAGNOSIS — R41841 Cognitive communication deficit: Secondary | ICD-10-CM

## 2015-11-12 DIAGNOSIS — M25652 Stiffness of left hip, not elsewhere classified: Secondary | ICD-10-CM

## 2015-11-12 DIAGNOSIS — I491 Atrial premature depolarization: Secondary | ICD-10-CM | POA: Insufficient documentation

## 2015-11-12 DIAGNOSIS — Z888 Allergy status to other drugs, medicaments and biological substances status: Secondary | ICD-10-CM | POA: Insufficient documentation

## 2015-11-12 NOTE — Therapy (Signed)
[facility] [address] [place], [place], [address] Phone: [phone]   Fax:  [phone]  Occupational Therapy Treatment  Patient Details  Name: Judy Spears MRN: [id number] Date of Birth: 22-Aug-1950 Referring Provider: Dr. Sharion Dove  Encounter Date: 11/12/2015      OT End of Session - 11/12/15 1339    Visit Number 20   Number of Visits 24   Date for OT Re-Evaluation 11/18/15   Authorization Type BCBS unlimited visits   OT Start Time 0935   OT Stop Time 1017   OT Time Calculation (min) 42 min   Activity Tolerance Patient tolerated treatment well   Behavior During Therapy [facility] for tasks assessed/performed      Past Medical History:  Diagnosis Date  . Arthritis    lt great toe  . Complication of anesthesia    pt has had headaches post op-did advise to hydra well preop  . Hair loss    right-sided  . History of exercise stress test    a. ETT (12/15) with 12:00 exercise, no ST changes, occasional PACs.   . Hyperlipidemia   . Insomnia   . Migraine headache   . 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 Postmenopausal   . Premature atrial contractions    a. Holter (12/15) with 8% PACs, no atrial runs or atrial fibrillation.     Past Surgical History:  Procedure Laterality Date  . BREAST BIOPSY  01/01/2011   Procedure: BREAST BIOPSY WITH NEEDLE LOCALIZATION;  Surgeon: Rolm Bookbinder, MD;  Location: [place];  Service: General;  Laterality: Left;  left breast wire localization biopsy  . cataracts right eye    . CESAREAN SECTION  1986  . HERNIA REPAIR  2000   RIH  . opirectinal membrane peel    . RHINOPLASTY  1983    There were no vitals filed for this visit.      Subjective Assessment - 11/12/15 1330    Subjective  "bathroom"   Patient is accompained by: Family member   Pertinent History see epic snapshot - ABI/hypoxia from tic bite/ virus   Patient Stated Goals Pt unable  to state.    Currently in Pain? No/denies   Pain Score 0-No pain                      OT Treatments/Exercises (OP) - 11/12/15 0001      ADLs   Toileting Patient indicated that she needed to use the restroom.  Patient able to flex forward at trunk to grasp grab bar with minimal assistance.  Feet  shoulder width apart, assisted to stand with mod assist so caregiver could adjust pants and brief.  Pivot to commode with max assist.  Patient attempted to complete hygiene, although movement here mostly perseverative and not effective for task.  Patient assisted to stand with mod assist.  Patient "standing" with significant hip and knee flexion, but active in bilateral LE's with controlled movement.       Neurological Re-education Exercises   Other Exercises 1 Neuromuscular reeducation to facilitate sit to/from stand form high and lower stool in parallel bars.  Patient able to transition from high stool - lift off with minimal assistance for weight shift of upper body forward.  Once in standing - worked on erect standing - with emphasis on knee, hip, and trunk extension.  Once aligned in standing, patient able to load left leg to  take small step forward and backward with right leg.     Other Exercises 2 Seated on stool during rest breaks - worked on eye hand coordination, and patient's ability to use two hands together for a common goal.  Patient continues to need cueing to utilize vision to guide hands toward targets or task.                    OT Short Term Goals - 11/04/15 1254      OT SHORT TERM GOAL #1   Title Pt and husband will be mod I with HEP - 10/21/2015   Status On-going     OT SHORT TERM GOAL #2   Title Pt will demonstrate adequate postural control for breath support to voice at least 3 words   Status Achieved     OT SHORT TERM GOAL #3   Title Pt will transfer to commode with moderate assistance 75% of the time.   Status On-going     OT SHORT TERM GOAL #4    Title Pt will tolerate positional changes as evidenced by respiration rate and motor quieting in preparation for funcitonal mobility   Status Achieved           OT Long Term Goals - 11/12/15 1341      OT LONG TERM GOAL #1   Title Pt and husband will be mod I with upgraded HEP - 11/18/2015   Status On-going     OT LONG TERM GOAL #2   Title Pt will demonstrate adequate postural control to support 5 words in supported sitting.   Status On-going     OT LONG TERM GOAL #3   Title Pt will demonstrate ability for low reach with RUE for functional task with  mod facilitation   Status Achieved     OT LONG TERM GOAL #4   Title Pt will wash face with LUE with min a in supported sitting   Status On-going     OT LONG TERM GOAL #5   Title Pt will release objects with L hand with min faciltation    Status On-going     OT LONG TERM GOAL #6   Title Pt will stand with min a while caregiver manages pants during LB dressing 75% of the time.   Status On-going     OT LONG TERM GOAL #7   Title Pt will transfer to commode with min a 75% of the time.   Status On-going     OT LONG TERM GOAL #8   Title Pt will be able to sit unsupported with supervision 50% of the time while engaged in functional task.   Status On-going               Plan - 11/12/15 1339    Clinical Impression Statement Patient with notably less tension in BLE's in both sitting and standing conditions today.  Husband and caregiver both note that legs are less stiff and patient appears to have improved control.  Patient indicates she cannot tell any difference yet.     Rehab Potential Fair   Clinical Impairments Affecting Rehab Potential severity of deficits, slow rate of recovery   OT Frequency 3x / week   OT Duration 8 weeks   OT Treatment/Interventions Self-care/ADL training;Ultrasound;Moist Heat;Electrical Stimulation;DME and/or AE instruction;Neuromuscular education;Therapeutic exercise;Functional Mobility  Training;Manual Therapy;Passive range of motion;Splinting;Therapeutic activities;Balance training;Patient/family education;Visual/perceptual remediation/compensation;Cognitive remediation/compensation   Plan LE activation for sit to stand and stand balance -alignment of trunk  and LE's in standing - reaching to encourage hip and trunk extension   Consulted and Agree with Plan of Care Patient;Family member/caregiver   Family Member Consulted caregiver [name], husband - [name]      Patient will benefit from skilled therapeutic intervention in order to improve the following deficits and impairments:  Decreased endurance, Decreased skin integrity, Impaired vision/preception, Improper body mechanics, Impaired perceived functional ability, Decreased knowledge of precautions, Cardiopulmonary status limiting activity, Decreased activity tolerance, Decreased knowledge of use of DME, Decreased strength, Impaired flexibility, Improper spinal/pelvic alignment, Impaired sensation, Difficulty walking, Decreased mobility, Decreased balance, Decreased cognition, Decreased range of motion, Impaired tone, Pain, Impaired UE functional use, Decreased safety awareness, Decreased coordination  Visit Diagnosis: Abnormal posture  Muscle weakness (generalized)  Other lack of coordination  Stiffness of right shoulder, not elsewhere classified  Stiffness of left shoulder, not elsewhere classified  Frontal lobe and executive function deficit  Unsteadiness on feet    Problem List Patient Active Problem List   Diagnosis Date Noted  . 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 Tracheostomy status ([place]) 09/26/2015  . Allergic rhinitis 09/26/2015  . 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 Movement disorder 09/22/2015  . Encephalitis 09/22/2015  . Chest pain 02/07/2014  . Premature atrial contractions 01/13/2014  . Abnormality of gait 12/04/2013  . Hyperlipidemia 12/21/2011  . Cardiovascular risk factor 12/21/2011  . Bunion  01/31/2008  . Metatarsalgia of both feet 09/20/2007  . FLAT FOOT 09/20/2007  . HALLUX RIGIDUS, ACQUIRED 09/20/2007    Mariah Milling 11/12/2015, 1:44 PM  [facility] [address] [place], [place], [address] Phone: [phone]   Fax:  [phone]  Name: Judy Spears MRN: [id number] Date of Birth: [birth year]

## 2015-11-12 NOTE — Therapy (Signed)
[facility] [address] [place], [place], [address] Phone: [phone]   Fax:  [phone]  Physical Therapy Treatment  Patient Details  Name: Judy Spears MRN: [id number] Date of Birth: [birth year] Referring Provider: Alger Simons, MD Sharion Dove, MD)  Encounter Date: 11/12/2015      PT End of Session - 11/12/15 1118    Visit Number 22   Number of Visits 25   Date for PT Re-Evaluation 11/22/15   Authorization Type BC[employer]   PT Start Time 5631   PT Stop Time 1100   PT Time Calculation (min) 45 min   Equipment Utilized During Treatment Other (comment)  standing frame   Activity Tolerance Patient tolerated treatment well;Other (comment)  decreased arousal during final 5 minutes of session   Behavior During Therapy WFL for tasks assessed/performed      Past Medical History:  Diagnosis Date  . Arthritis    lt great toe  . Complication of anesthesia    pt has had headaches post op-did advise to hydra well preop  . Hair loss    right-sided  . History of exercise stress test    a. ETT (12/15) with 12:00 exercise, no ST changes, occasional PACs.   . Hyperlipidemia   . Insomnia   . Migraine headache   . 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 Postmenopausal   . Premature atrial contractions    a. Holter (12/15) with 8% PACs, no atrial runs or atrial fibrillation.     Past Surgical History:  Procedure Laterality Date  . BREAST BIOPSY  01/01/2011   Procedure: BREAST BIOPSY WITH NEEDLE LOCALIZATION;  Surgeon: Rolm Bookbinder, MD;  Location: [facility];  Service: General;  Laterality: Left;  left breast wire localization biopsy  . cataracts right eye    . CESAREAN SECTION  1986  . HERNIA REPAIR  2000   RIH  . opirectinal membrane peel    . RHINOPLASTY  1983    There were no vitals filed for this visit.      Subjective Assessment - 11/12/15 1111    Subjective Per husband, pt  attending first public outing (large wedding of a family friend's daughter) since her injury. Husband reports nurses will be coming with he and patient to [place] this weekend. Pt has appt at trach clinic later today. Family is hopeful for removal of trach.   Patient is accompained by: Family member  husband, [name], and caregiver, [name]   Pertinent History Precautions: trach (capped), PEG, seizure. 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 No/denies  Pt occasionally states "ouch" during standing frame; signs of discomfort resolved with decreased passive L hip extension         NMR/manual therapy: While wearing B articulating custom AFO's, pt performed static/dynamic standing x24 consecutive minutes with total A of standing frame with mirror anterior to pt for visual feedback of postural alignment. Initially, pt able to tolerate -24 degrees of L hip extension PROM in standing frame. While pt in end range L hip extension in standing frame, performed grades I-III P/A of L femur 5 x45-second increments, raising standing frame to increase L hip extension between each bout of  mobilizations. Note capsular end feel with P/A of L femur in standing. After 5 bouts of mobilizations and >20 minutes of standing, able to achieve -13 degrees of L hip extension in standing. During standing, pt required up to intermittent verbal cueing to utilize mirror for visual feedback and to self-correct postural alignment. During final 5 minutes of standing frame, pt effectively utilized BUE's to push head/shoulders away from standing frame table for increased hip extension, thoracic extension, to stretch anterior chest/shoulder girdles for  improved postural alignment. Lowered standing frame to place pt in 45 degrees of hip/knee flexion in attempt to have pt actively perform remaining range of B hip/knee extension; however, pt unable to actively initiate extension, even with max manual facilitation.                        PT Education - 11/12/15 1114    Education provided Yes   Education Details HEP: reviewed stretching BLE's for spasticity management; added hip internal rotation stretch (caregiver-assisted); see Pt Instructions for details. Also discussed the following: positioning of pt's w/c to promote active cervical spine rotation to R; progressive increase of pt tolerance to supine position; and orthotist consult to assess if pt would be appropriate for GRAFO's.   Person(s) Educated Patient;Spouse;Caregiver(s)   Methods Explanation;Handout   Comprehension Verbalized understanding          PT Short Term Goals - 10/29/15 2042      PT SHORT TERM GOAL #1   Title Pt will initiate anterior weight shift sufficient to unweight hips with supervision, 25% cueing in preparation for functional transfer.  (Target date: 10/21/15)   Baseline Can initiate with min A inconsistently with 50% cuing.     Status Partially Met     PT SHORT TERM GOAL #2   Title Pt will perform seated scooting (AP/PA and M/L) with supervision, 25% cueing to increase independence with transfers, repositioning. (10/21/15)   Baseline 10/4: Requires min-mod A, 25-50% cueing from lowest mat table height   Status Partially Met     PT SHORT TERM GOAL #3   Title Pt will tolerate static standing with total A of standing frame x10 minutes with vital signs WNL to improve standing tolerance and for tone management.  (10/21/15)   Baseline met    Status Achieved     PT SHORT TERM GOAL #4   Title Pt will perform dynamic sitting >/= 5 minutes with BLE's supported, intermittent UE support with CGA to indicate improved postural stability/control necessary  for functional transfers. (10/21/15)   Status Achieved     PT SHORT TERM GOAL #5   Title Pt will perform sit <> side lying with mod A, 50% cueing to progress toward increased independence with bed mobility.  (10/21/15)   Baseline 9/22: R sidelying > sit with mod A.    Status Achieved     PT SHORT TERM GOAL #6   Title Pt will consistently perform sit <> stand with min A, 25% cueing to indicate increased independence with functional transfers.  (10/21/15)   Baseline 10/4: Continues to require mod-max A    Status Not Met     PT SHORT TERM GOAL #7   Title Pt will perform level transfer from w/c <> mat table with mod A, 50% cueing to decrease caregiver burden with functional transfers.  (10/21/15)   Baseline mod/max A on 10/23/15, max A cues for sequencing and hand placement.    Status Partially Met  PT Long Term Goals - 09/23/15 2120      PT LONG TERM GOAL #1   Title Pt will perform supine > sit with mod A, 50% cueing to indicate increased independence with getting out of bed.  (Target: 11/18/15)     PT LONG TERM GOAL #2   Title Pt will perform sit > supine with mod A, 50% cueing to indicate increased independence with getting into bed.  (11/18/15)     PT LONG TERM GOAL #3   Title Pt will perform level transfer from w/c <> mat table with min A, 25% cueing to decrease caregiver burden with functional transfers.  (11/18/15)     PT LONG TERM GOAL #4   Title Pt will perform dynamic standing >/= 2 consecutive minutes with BUE support and mod, 50% cueing to indicate improved postural stanbility/control.   (11/18/15)     PT LONG TERM GOAL #5   Title Patient will ambulate x15' with max A of spouse using LRAD to enable pt/spouse to safely walk into/out of home bathroom.  (11/18/15)               Plan - 11/12/15 1119    Clinical Impression Statement Session focused on use of standing frame for improved postural alignment, increased hip extensibility (emphasis on L hip), and  for use of WB for spasticty management. While standing with total A of standing frame, pt progressed from tolerating -24 degrees to -13 degrees of L hip extension with use of L hip joint mobilizations. During joint mobilizations, note that L hip extension is limited by soft tissue extensibility, as endfeel is capsular with P/A of L femur in standing. L hip extension PROM limited by Provided further instructions for home stretching program with verbal understanding from husband and caregiver.   Rehab Potential Good   Clinical Impairments Affecting Rehab Potential Positive: strong family/social support; negative: chronicity of condition   PT Frequency 3x / week   PT Duration 8 weeks   PT Treatment/Interventions ADLs/Self Care Home Management;DME Instruction;Gait training;Stair training;Functional mobility training;Therapeutic activities;Patient/family education;Cognitive remediation;Neuromuscular re-education;Therapeutic exercise;Balance training;Orthotic Fit/Training;Wheelchair mobility training;Manual techniques;Splinting;Visual/perceptual remediation/compensation;Vestibular;Passive range of motion   PT Next Visit Plan Continue to address midline alignment, anterior weight shift, and LE activation. Also address L hip extension ROM, if safe/possible.   Recommended Other Services     Consulted and Agree with Plan of Care Patient;Family member/caregiver   Family Member Consulted caregiver, [name] and husband, [name]      Patient will benefit from skilled therapeutic intervention in order to improve the following deficits and impairments:  Abnormal gait, Decreased balance, Decreased endurance, Decreased cognition, Cardiopulmonary status limiting activity, Decreased activity tolerance, Decreased coordination, Decreased strength, Impaired flexibility, Impaired tone, Decreased mobility, Decreased skin integrity, Increased muscle spasms, Impaired vision/preception, Decreased range of motion, Impaired UE functional  use, Postural dysfunction, Pain  Visit Diagnosis: Stiffness of left hip, not elsewhere classified  Abnormal posture  Muscle weakness (generalized)  Other lack of coordination  Other abnormalities of gait and mobility     Problem List Patient Active Problem List   Diagnosis Date Noted  . 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[facility]) 10/20/2015  . Tracheostomy status ([facility]) 09/26/2015  . Allergic rhinitis 09/26/2015  . 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 Movement disorder 09/22/2015  . Encephalitis 09/22/2015  . Chest pain 02/07/2014  . Premature atrial contractions 01/13/2014  . Abnormality of gait 12/04/2013  . Hyperlipidemia 12/21/2011  . Cardiovascular risk factor 12/21/2011  . Bunion 01/31/2008  . Metatarsalgia of both feet 09/20/2007  .  FLAT FOOT 09/20/2007  . HALLUX RIGIDUS, ACQUIRED 09/20/2007    Billie Ruddy, PT, DPT [facility] [address] [place], [place], [address] Phone: [phone]   Fax:  [phone] 11/12/15, 11:56 AM  Name: Judy Spears MRN: [id number] Date of Birth: [birth year]

## 2015-11-12 NOTE — Therapy (Signed)
[facility] [address] [place], [place], [address] Phone: [phone]   Fax:  [phone]  Speech Language Pathology Treatment  Patient Details  Name: Judy Spears MRN: [id number] Date of Birth: [birth year] Referring Provider: Dr. Alger Simons  Encounter Date: 11/12/2015      End of Session - 11/12/15 1718    Visit Number 21   Number of Visits 24   Date for SLP Re-Evaluation 11/18/15   SLP Start Time 1103   SLP Stop Time  1140   SLP Time Calculation (min) 37 min   Activity Tolerance Patient tolerated treatment well      Past Medical History:  Diagnosis Date  . Arthritis    lt great toe  . Complication of anesthesia    pt has had headaches post op-did advise to hydra well preop  . Hair loss    right-sided  . History of exercise stress test    a. ETT (12/15) with 12:00 exercise, no ST changes, occasional PACs.   . Hyperlipidemia   . Insomnia   . Migraine headache   . 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 Postmenopausal   . Premature atrial contractions    a. Holter (12/15) with 8% PACs, no atrial runs or atrial fibrillation.     Past Surgical History:  Procedure Laterality Date  . BREAST BIOPSY  01/01/2011   Procedure: BREAST BIOPSY WITH NEEDLE LOCALIZATION;  Surgeon: Rolm Bookbinder, MD;  Location: [facility];  Service: General;  Laterality: Left;  left breast wire localization biopsy  . cataracts right eye    . CESAREAN SECTION  1986  . HERNIA REPAIR  2000   RIH  . opirectinal membrane peel    . RHINOPLASTY  1983    There were no vitals filed for this visit.      Subjective Assessment - 11/12/15 1144    Subjective Pt somnolent requiring SLP tactile cues first 6 minutes ST.   Patient is accompained by: Family member  caregiver and spouse   Currently in Pain? No/denies               ADULT SLP TREATMENT - 11/12/15 1151      General Information   Behavior/Cognition Alert;Cooperative;Pleasant mood   Patient Positioning Upright in chair     Treatment Provided   Treatment provided Cognitive-Linquistic     Cognitive-Linquistic Treatment   Treatment focused on Voice;Dysarthria   Skilled Treatment Pt was less somnolent after first 6 minutes. Family agreed to end therapy at 1140 due to pt somnolence, but pt easily awakened today with tactile stimulation throughout the rest of the session. Pt voiced spontaneously x8 today of varying strengths, usually 1 syllable in repetition (approx 10% of all requests/opportunities). SLP used tactile, verbal, visual, demo cues with hand over hand assist for drawing air into lungs. SLP used pt's thumb on neck for feeling "buzz" of voicing as well.  Pt mouthed answers for apprx 75% of SLP's questions for her.     Assessment / Recommendations / Plan   Plan Continue with current plan of care     Progression Toward Goals   Progression toward goals Progressing toward goals          SLP Education - 11/12/15 1717    Education provided Yes   Education Details Use pt's thumb on neck with attempts at imitating simple vowels containing communicative intent ("ooooo", "uhhuh", "uh-uh" "ohhhh")   Person(s) Educated Patient;Spouse;Caregiver(s)  Methods Explanation;Demonstration   Comprehension Verbalized understanding          SLP Short Term Goals - 11/12/15 1720      SLP SHORT TERM GOAL #1   Title Pt will demonstrate audible phonation with words, vowels, or syllables in 4/10 trials with max A   Status On-going     SLP SHORT TERM GOAL #2   Title Pt will demonstrate breath support with max A, prior to repetition of words 4/10   Status On-going     SLP SHORT TERM GOAL #3   Title Pt will swallow ice chip/H20 via spoon in less than 7 seconds with usual min to mod A 3/5 trials   Status On-going     SLP SHORT TERM GOAL #4   Title Pt will sustain attention to therapy task for over 7 minutes with occasional  min A   Status On-going          SLP Long Term Goals - 11/12/15 1720      SLP LONG TERM GOAL #1   Title Pt will demonstrate audible phonation/intelligible speech with 2 word phrases with occasional min A 5/7 utterances   Time 1   Period Weeks   Status On-going     SLP LONG TERM GOAL #2   Title Pt will demonstrate audible cough/throat clear to command and to clear secretions with occasional min A 4/5 trials.    Time 1   Period Weeks   Status On-going     SLP LONG TERM GOAL #3   Title Pt will achieve timely oral phase and swallow with ice chip/ water trials 5/7 trials   Time 1   Period Weeks   Status On-going     SLP LONG TERM GOAL #4   Title Pt will sustain attention to therapy tasks for 12 minutes with occasiona min A   Time 1   Period Weeks   Status On-going          Plan - 11/12/15 1718    Clinical Impression Statement Focus on speech/voicing today. Pt was able to remain alert for 85% of session, using tactile stimulation. Persistent significant deficits in speech and swallowing. Skilled SLP services warranted to decrease caregiver burden by maximizing pt function.   Speech Therapy Frequency 3x / week   Duration --  3 more visits   Treatment/Interventions Aspiration precaution training;Trials of upgraded texture/liquids;Compensatory strategies;Functional tasks;Patient/family education;Cueing hierarchy;Diet toleration management by SLP;Cognitive reorganization;Compensatory techniques;SLP instruction and feedback;Internal/external aids;Multimodal communcation approach   Potential to Achieve Goals Fair   Potential Considerations Ability to learn/carryover information;Co-morbidities;Severity of impairments      Patient will benefit from skilled therapeutic intervention in order to improve the following deficits and impairments:   Dysphagia, oropharyngeal phase  Dysarthria and anarthria  Cognitive communication deficit    Problem List Patient Active Problem List    Diagnosis Date Noted  . Tracheostomy tube present ([facility])   . Tracheal stenosis   . Chronic respiratory failure with hypoxia ([facility])   . 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[facility]) 10/20/2015  . Tracheostomy status ([facility]) 09/26/2015  . Allergic rhinitis 09/26/2015  . 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 Movement disorder 09/22/2015  . Encephalitis 09/22/2015  . Chest pain 02/07/2014  . Premature atrial contractions 01/13/2014  . Abnormality of gait 12/04/2013  . Hyperlipidemia 12/21/2011  . Cardiovascular risk factor 12/21/2011  . Bunion 01/31/2008  . Metatarsalgia of both feet 09/20/2007  . FLAT FOOT 09/20/2007  . HALLUX RIGIDUS, ACQUIRED 09/20/2007    [facility] ,MS, CCC-SLP  11/12/2015, 5:21 PM  [facility] [address] [place], [place], [address] Phone: [phone]   Fax:  [phone]   Name: Judy Spears MRN: [id number] Date of Birth: 1950/11/18

## 2015-11-12 NOTE — Progress Notes (Signed)
Tracheostomy Procedure Note  Judy Spears [id number] Nov 02, 1950  Pre Procedure Tracheostomy Information  Trach Brand: [place]iley Size: 4.0 Style: Uncuffed Secured by: Velcro   Procedure: decannulation    Post Procedure Tracheostomy Information No trach  Post Procedure Evaluation:   Vital signs:blood pressure 137/74, pulse 81, respirations 18 and pulse oximetry 100 % Patients current condition: stable Complications: No apparent complications Trach site exam: stoma clean  Wound care done: Other: occlusive dressing applied Patient did tolerate procedure well.   Education: Stoma and site care post decannulation   Prescription needs: None      Additional needs: none

## 2015-11-12 NOTE — Patient Instructions (Signed)
Hamstring stretch      Place foot on a low step/surface (surface height in leftmost picture for LEFT leg; surface height on rightmost picture for RIGHT leg), such that [name]'s knee is straight. Goal: gentle stretch of back of leg (hamstrings) [name] is able to tolerate for __30-45__ seconds. Perform __4__ reps per set, __2__ sets per day on each leg. To progress: progressively elevate surface height, as tolerated.   Hip adductor stretch   It may be easiest to perform this stretch immediately after the hamstring stretch above. With [name] seated in wheelchair, foot on stool/step, and knee completely straight, move the stool outward so a stretch will occur on the inner thigh. Goal: gentle stretch of inner thigh [name] is able to tolerate for __30-45__ seconds. Perform __4__ reps per set, __2__ sets per day on each leg. To progress: progressively move surface further outward, as tolerated.   Seated Hip Internal Rotation Stretch   Seated in wheelchair with legs apart, caregiver uses hand to gentle move knee inward, as pictured. Hold for  __30-45__ seconds. Perform __4__ reps per set, __2__ sets per day on each leg.  Other recommendations: - Have [name] start lying on her back for 10-15 consecutive minutes daily. She may initially tolerate lying with head of bed elevated 30 degrees. Progress by 5 degrees every 2-3 days, as tolerated.  - Orient [name]'s wheelchair such that she must turn her head to the right. For example, place television, computer screen, or anything else she likes to attend to/engage in on her right side. This will get her neck more mobile.

## 2015-11-12 NOTE — Progress Notes (Signed)
Subjective:  Here for decannulation    Patient ID: Judy Spears, female    DOB: 19-Feb-1950, 65 y.o.   MRN: [id number]  HPI  65 year old female who in November 2016 was bitten by a tick and then within 2 weeks had a progressive neurologic illness. Specifically she was found unresponsive by her husband while [place] in [place] of [place]. She was brought to local hospital where she required intubation due to unresponsiveness. Tracheostomy was performed on hospital day 2 and extensive workup for her neurologic illness begun. 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 She was then transferred for inpatient rehabilitation to the [facility] in [place]. While there she continued to participate in physical therapy and occupational therapy. However, during this time she was never able to consistently take by mouth and all nutrition has been provided the enteral feeding since around the time of her admission to the intensive care unit.  The tracheostomy has been in place since her original hospitalization in December 2016. It was briefly removed in May of 2017 but unfortunately she had a complication of laryngospasm and it sounds like the tracheostomy was replaced approximately 2 weeks later. she also underwent tracheal dilation surgery for tracheal stenosis by an ear nose and throat physician in [place]. This apparently was also in May 2017. She has been at home with trach capped since her hospital discharge and was seen by Dr Lake Bells who discussed decannulation and it was decided to give the patient a little more time to see how much if any suctioning was being required and if no issues she would follow here at the trach clinic for either decannulation OR continued trach care.   On arrival she is awake, sitting up in chair. Able to phonate w/ capped #4 trach in place. No distress Husband tells me he has not had to  suction her in over 4 weeks.   Past Medical History:  Diagnosis Date  . Arthritis    lt great toe  . Complication of anesthesia    pt has had headaches post op-did advise to hydra well preop  . Hair loss    right-sided  . History of exercise stress test    a. ETT (12/15) with 12:00 exercise, no ST changes, occasional PACs.   . Hyperlipidemia   . Insomnia   . Migraine headache   . 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 Postmenopausal   . Premature atrial contractions    a. Holter (12/15) with 8% PACs, no atrial runs or atrial fibrillation.    Social History   Social History  . Marital status: Married    Spouse name: N/A  . Number of children: N/A  . Years of education: N/A   Occupational History  . Not on file.   Social History Main Topics  . Smoking status: Never Smoker  . Smokeless tobacco: Never Used  . Alcohol use 0.0 oz/week     Comment: Occasional.  . Drug use: No  . Sexual activity: Yes    Birth control/ protection: Post-menopausal   Other Topics Concern  . Not on file   Social History Narrative  . No narrative on file   Family History  Problem Relation Age of Onset  . Stroke Mother     paralyzed on right side/aphasia  . Hypertension Mother     had stroke after stopping BP meds  .  Colon cancer Father   . Prostate cancer Father   . Heart attack Maternal Grandfather 56  . Stroke Maternal Uncle 50   Allergies  Allergen Reactions  . Pollen Extract Other (See Comments)    Running nose  . Tetracyclines & Related     Blisters, photosensitivity   Prior to Admission medications   Medication Sig Start Date End Date Taking? Authorizing Provider  amantadine (SYMMETREL) 50 MG/5ML solution Place 15 mLs (150 mg total) into feeding tube 2 (two) times daily. 10/10/15   Meredith Staggers, MD  atorvastatin (LIPITOR) 20 MG tablet TAKE ONE TABLET EACH DAY 10/20/15   Larey Dresser, MD  chlorhexidine (PERIDEX) 0.12 % solution Use as directed 15 mLs in the  mouth or throat 2 (two) times daily. 10/08/15   Meredith Staggers, MD  clonazePAM (KLONOPIN) 0.5 MG tablet Place 1 tablet (0.5 mg total) into feeding tube as directed. Take 0.25mg  at 0400 and 1400 and 0.5mg  at 2000 10/20/15   Meredith Staggers, MD  Coenzyme Q10 (COQ-10 PO) Give by tube.    Historical Provider, MD  docusate (COLACE) 50 MG/5ML liquid Take 10 mLs (100 mg total) by mouth daily. 10/10/15   Meredith Staggers, MD  fluticasone (FLONASE) 50 MCG/ACT nasal spray Place 2 sprays into both nostrils daily. 10/10/15   Meredith Staggers, MD  gabapentin (NEURONTIN) 100 MG capsule Take 1 capsule (100 mg total) by mouth at bedtime. 10/10/15   Meredith Staggers, MD  Lactobacillus Rhamnosus, GG, (PROBIOTIC COLIC) LIQD Take by mouth every morning.     Historical Provider, MD  loratadine (CLARITIN) 10 MG tablet Place 10 mg into feeding tube daily.    Historical Provider, MD  nitrofurantoin, macrocrystal-monohydrate, (MACROBID) 100 MG capsule Take 100 mg by mouth 2 (two) times daily.    Historical Provider, MD  Nystatin POWD by Does not apply route.    Historical Provider, MD  tobramycin-dexamethasone [facility]) ophthalmic solution Place 1 drop into both eyes 2 (two) times daily.  08/26/15   Historical Provider, MD  traZODone (DESYREL) 50 MG tablet Place 1-2 tablets (50-100 mg total) into feeding tube at bedtime. 10/10/15   Meredith Staggers, MD  valACYclovir (VALTREX) 500 MG tablet Place 1 tablet (500 mg total) into feeding tube daily. 10/10/15   Meredith Staggers, MD    Review of Systems  Unable to perform ROS: Patient nonverbal   137/74, pulse 81, respirations 18 and pulse oximetry 100 %    Objective:   Physical Exam  Constitutional: She appears well-nourished. No distress.  HENT:  Head: Normocephalic and atraumatic.  Mouth/Throat: Oropharynx is clear and moist.  Eyes: Conjunctivae are normal. Pupils are equal, round, and reactive to light. No scleral icterus.  Neck:  # 4 cuffless trach was in place. Capped  w/ red cap. Phonation intact w/ air leaking around stoma.  No significant discharge   Cardiovascular: Normal rate and regular rhythm.  Exam reveals no friction rub.   No murmur heard. Pulmonary/Chest: Effort normal. No respiratory distress. She has no wheezes. She has rales.  Abdominal: Soft. Bowel sounds are normal.  Musculoskeletal: She exhibits no edema.  Spastic lower extremity movement   Neurological: She is alert. She exhibits abnormal muscle tone. Coordination abnormal.  Skin: Skin is warm and dry. No rash noted. She is not diaphoretic. No erythema.      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 Now resolved.  Trach removed this visit.   Plan Keep occlusive dressing in place Then change q 24 hrs until stoma closed.  Will check in next week; if trach not closed w/ in next 6-8 weeks could consider ENT eval but this is an invasive surgery   Erick Colace ACNP-BC [place] Pager # [phone] OR # [phone] if no answer  She developed viral encephalitis after insect bite.  She had prolonged recovery with course complicated by respiratory failure needing tracheostomy, tracheal stenosis, and failure for stoma to close with previous attempt at decannulation.  Her husband reports her trach has been capped 24 hrs per day for at least 7 weeks.  She maintains her oxygenation on room air, no issues with breathing while asleep, and no difficulty with coughing and expectorating.  She still has limited swallowing, and only takes ice chips and light liquids in small amounts.  Otherwise nutrition is through G tube.  She is pleasant, alert, tracks with eyes.  Able to move extremities.  Sitting in wheelchair.  Trach site clean.  No wheeze, no stridor.   HR regular.  Abd soft.  Decreased muscle bulk.  Tracheostomy successfully removed w/o difficulty.  Maintained hemodynamics and oxygenation.  Updated pt's family about tracheostomy stoma care and addressed their questions.  Chesley Mires, MD [facility] Pulmonary/Critical Care 11/12/2015, 4:28 PM Pager:  [phone] After 3pm call: [phone]

## 2015-11-13 ENCOUNTER — Ambulatory Visit: Payer: BC Managed Care – PPO | Admitting: Occupational Therapy

## 2015-11-13 ENCOUNTER — Encounter: Payer: Self-pay | Admitting: Occupational Therapy

## 2015-11-13 ENCOUNTER — Ambulatory Visit: Payer: BC Managed Care – PPO | Admitting: Rehabilitation

## 2015-11-13 ENCOUNTER — Encounter: Payer: Self-pay | Admitting: Rehabilitation

## 2015-11-13 DIAGNOSIS — R293 Abnormal posture: Secondary | ICD-10-CM

## 2015-11-13 DIAGNOSIS — M25612 Stiffness of left shoulder, not elsewhere classified: Secondary | ICD-10-CM

## 2015-11-13 DIAGNOSIS — R2681 Unsteadiness on feet: Secondary | ICD-10-CM

## 2015-11-13 DIAGNOSIS — R2689 Other abnormalities of gait and mobility: Secondary | ICD-10-CM

## 2015-11-13 DIAGNOSIS — M25611 Stiffness of right shoulder, not elsewhere classified: Secondary | ICD-10-CM

## 2015-11-13 DIAGNOSIS — R41844 Frontal lobe and executive function deficit: Secondary | ICD-10-CM

## 2015-11-13 DIAGNOSIS — M6281 Muscle weakness (generalized): Secondary | ICD-10-CM

## 2015-11-13 DIAGNOSIS — R1312 Dysphagia, oropharyngeal phase: Secondary | ICD-10-CM | POA: Diagnosis not present

## 2015-11-13 DIAGNOSIS — R278 Other lack of coordination: Secondary | ICD-10-CM

## 2015-11-13 NOTE — Therapy (Signed)
[facility] [address] [place], [place], [address] Phone: [phone]   Fax:  [phone]  Occupational Therapy Treatment  Patient Details  Name: Judy Spears MRN: [id number] Date of Birth: Jun 28, 1950 Referring Provider: Dr. Sharion Dove  Encounter Date: 11/13/2015      OT End of Session - 11/13/15 1624    Visit Number 21   Number of Visits 24   Date for OT Re-Evaluation 11/18/15   Authorization Type [employer] unlimited visits   OT Start Time 1530   OT Stop Time 1610   OT Time Calculation (min) 40 min   Activity Tolerance Patient limited by lethargy      Past Medical History:  Diagnosis Date  . Arthritis    lt great toe  . Complication of anesthesia    pt has had headaches post op-did advise to hydra well preop  . Hair loss    right-sided  . History of exercise stress test    a. ETT (12/15) with 12:00 exercise, no ST changes, occasional PACs.   . Hyperlipidemia   . Insomnia   . Migraine headache   . 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 Postmenopausal   . Premature atrial contractions    a. Holter (12/15) with 8% PACs, no atrial runs or atrial fibrillation.     Past Surgical History:  Procedure Laterality Date  . BREAST BIOPSY  01/01/2011   Procedure: BREAST BIOPSY WITH NEEDLE LOCALIZATION;  Surgeon: Rolm Bookbinder, MD;  Location: [facility];  Service: General;  Laterality: Left;  left breast wire localization biopsy  . cataracts right eye    . CESAREAN SECTION  1986  . HERNIA REPAIR  2000   RIH  . opirectinal membrane peel    . RHINOPLASTY  1983    There were no vitals filed for this visit.      Subjective Assessment - 11/13/15 1615    Subjective  "BYE"   Patient is accompained by: Family member  husband and caregiver [name]   Pertinent History see epic snapshot - ABI/hypoxia from tic bite/ virus   Patient Stated Goals Pt unable to state.    Currently in Pain?  No/denies                      OT Treatments/Exercises (OP) - 11/13/15 0001      Neurological Re-education Exercises   Other Exercises 1 Neuro re ed to address sitting balance, lateral weight shifting in sitting, sit to stand, standing with thoracic extension and increased activation of LE's   Also addressed trunk rotation, and incorporated reaching activity into standing. Emphasis also placed on "releasing"head as pt fixes thru UE's and head for postural stability in sitting and standing.     Manual Therapy   Manual Therapy Joint mobilization;Soft tissue mobilization;Scapular mobilization   Manual therapy comments Joint, soft tissue and scap mob to BUE's to increase available ROM for functional use. Atempted functional reach activity following manual therapy however pt too lethargic today to follow commands. Pt had trach removed yesterday.                    OT Short Term Goals - 11/13/15 1619      OT SHORT TERM GOAL #1   Title Pt and husband will be mod I with HEP - 10/21/2015   Status On-going     OT SHORT TERM GOAL #2   Title Pt  will demonstrate adequate postural control for breath support to voice at least 3 words   Status Achieved     OT SHORT TERM GOAL #3   Title Pt will transfer to commode with moderate assistance 25% of the time.   Status Revised     OT SHORT TERM GOAL #4   Title Pt will tolerate positional changes as evidenced by respiration rate and motor quieting in preparation for funcitonal mobility   Status Achieved           OT Long Term Goals - 11/13/15 1620      OT LONG TERM GOAL #1   Title Pt and husband will be mod I with upgraded HEP - 11/18/2015   Status On-going     OT LONG TERM GOAL #2   Title Pt will demonstrate adequate postural control to support 3 words in supported sitting.   Status Revised     OT LONG TERM GOAL #3   Title Pt will demonstrate ability for low reach with RUE for functional task with  mod facilitation    Status Achieved     OT LONG TERM GOAL #4   Title Pt will wash face with LUE with min a in supported sitting   Status On-going     OT LONG TERM GOAL #5   Title Pt will release objects with L hand with min faciltation    Status On-going     OT LONG TERM GOAL #6   Title Pt will stand with min a while caregiver manages pants during LB dressing 75% of the time.   Status On-going     OT LONG TERM GOAL #7   Title Pt will transfer to commode with mod a 50% of the time.   Status Revised     OT LONG TERM GOAL #8   Title Pt will be able to sit unsupported with supervision 50% of the time while engaged in functional task.   Status On-going               Plan - 11/13/15 1621    Clinical Impression Statement Pt very lethargic today. Pt had trach removed yesterday.  Pt with slowly improving sitting balance and more active in LE's for sit tostand.  Pt's status varies from session to session.    Rehab Potential Fair   Clinical Impairments Affecting Rehab Potential severity of deficits, slow rate of recovery   OT Frequency 3x / week   OT Duration 8 weeks   OT Treatment/Interventions Self-care/ADL training;Ultrasound;Moist Heat;Electrical Stimulation;DME and/or AE instruction;Neuromuscular education;Therapeutic exercise;Functional Mobility Training;Manual Therapy;Passive range of motion;Splinting;Therapeutic activities;Balance training;Patient/family education;Visual/perceptual remediation/compensation;Cognitive remediation/compensation   Plan LE activiation for sit to stand and stand balance, alignment of trunk and LE's in stanidng, reaching for hip and trunk extension, simple ADL   Consulted and Agree with Plan of Care Patient;Family member/caregiver   Family Member Consulted caregiver [name], husband - [name]      Patient will benefit from skilled therapeutic intervention in order to improve the following deficits and impairments:  Decreased endurance, Decreased skin integrity, Impaired  vision/preception, Improper body mechanics, Impaired perceived functional ability, Decreased knowledge of precautions, Cardiopulmonary status limiting activity, Decreased activity tolerance, Decreased knowledge of use of DME, Decreased strength, Impaired flexibility, Improper spinal/pelvic alignment, Impaired sensation, Difficulty walking, Decreased mobility, Decreased balance, Decreased cognition, Decreased range of motion, Impaired tone, Pain, Impaired UE functional use, Decreased safety awareness, Decreased coordination  Visit Diagnosis: Abnormal posture  Muscle weakness (generalized)  Other lack of coordination  Stiffness of right shoulder, not elsewhere classified  Stiffness of left shoulder, not elsewhere classified  Frontal lobe and executive function deficit  Unsteadiness on feet    Problem List Patient Active Problem List   Diagnosis Date Noted  . Tracheostomy tube present ([place])   . Tracheal stenosis   . Chronic respiratory failure with hypoxia ([place])   . 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[place]) 10/20/2015  . Tracheostomy status ([place]) 09/26/2015  . Allergic rhinitis 09/26/2015  . 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 Movement disorder 09/22/2015  . Encephalitis 09/22/2015  . Chest pain 02/07/2014  . Premature atrial contractions 01/13/2014  . Abnormality of gait 12/04/2013  . Hyperlipidemia 12/21/2011  . Cardiovascular risk factor 12/21/2011  . Bunion 01/31/2008  . Metatarsalgia of both feet 09/20/2007  . FLAT FOOT 09/20/2007  . HALLUX RIGIDUS, ACQUIRED 09/20/2007    Quay Burow, OTR/L 11/13/2015, 4:26 PM  [facility] [address] [place], [place], [address] Phone: [phone]   Fax:  [phone]  Name: SARISSA AZLIN MRN: [id number] Date of Birth: Feb 07, 1950

## 2015-11-13 NOTE — Therapy (Signed)
[facility] [address] [place], [place], [address] Phone: [phone]   Fax:  [phone]  Physical Therapy Treatment  Patient Details  Name: Judy Spears MRN: [id number] Date of Birth: 05/19/1950 Referring Provider: Alger Simons, MD Sharion Dove, MD)  Encounter Date: 11/13/2015      PT End of Session - 11/13/15 1720    Visit Number 23   Number of Visits 25   Date for PT Re-Evaluation 11/22/15   Authorization Type BCBS   PT Start Time 1448   PT Stop Time 1532   PT Time Calculation (min) 44 min   Equipment Utilized During Treatment Other (comment)  standing frame   Activity Tolerance Patient tolerated treatment well;Other (comment)  decreased arousal during final 5 minutes of session   Behavior During Therapy WFL for tasks assessed/performed      Past Medical History:  Diagnosis Date  . Arthritis    lt great toe  . Complication of anesthesia    pt has had headaches post op-did advise to hydra well preop  . Hair loss    right-sided  . History of exercise stress test    a. ETT (12/15) with 12:00 exercise, no ST changes, occasional PACs.   . Hyperlipidemia   . Insomnia   . Migraine headache   . 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 Postmenopausal   . Premature atrial contractions    a. Holter (12/15) with 8% PACs, no atrial runs or atrial fibrillation.     Past Surgical History:  Procedure Laterality Date  . BREAST BIOPSY  01/01/2011   Procedure: BREAST BIOPSY WITH NEEDLE LOCALIZATION;  Surgeon: Rolm Bookbinder, MD;  Location: [facility];  Service: General;  Laterality: Left;  left breast wire localization biopsy  . cataracts right eye    . CESAREAN SECTION  1986  . HERNIA REPAIR  2000   RIH  . opirectinal membrane peel    . RHINOPLASTY  1983    There were no vitals filed for this visit.      Subjective Assessment - 11/13/15 1708    Subjective Note pt got trach  removed yesterday, had busy day today.     Patient is accompained by: Family member   Pertinent History Precautions:  PEG, seizure. 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 No/denies                         [facility] Adult PT Treatment/Exercise - 11/13/15 1709      Transfers   Transfers Sit to Stand;Stand to Sit   Sit to Stand 1: +2 Total assist   Sit to Stand Details Tactile cues for initiation;Tactile cues for placement;Verbal cues for sequencing;Verbal cues for technique;Visual cues/gestures for sequencing;Manual facilitation for weight shifting;Manual facilitation for placement;Manual facilitation for weight bearing   Sit to Stand Details (indicate cue type and reason) --   Sit to Stand: Patient Percentage 30%   Stand to Sit 1: +2 Total assist   Squat Pivot Transfers --   Comments See NMR details below.      Ambulation/Gait   Ambulation/Gait No     Neuro Re-ed  Neuro Re-ed Details  Continue to work on increased pt activation with sit <>stand, improved postural control, midline orientation, WB through BLEs with PT providing manual mobilization at L femur for improved L hip extension.  Pt very solmnolent during session and difficult to arouse during session.  OT Mackie Pai) assisted during session to faciliate upright posture, midline orientation, head/shoulder/trunk alignment and improved trunk/head flexibility.  PT assisted with forward weight shift (as well as OT), alignment of LLE during standing, and mobilization at hip for improved hip flexion and upright posture.  Also work on lateral weight shifting and UE task to the R to encourage trunk rotation to the R and R gaze.                   PT Education - 11/12/15 1114    Education provided Yes   Education Details HEP: reviewed stretching BLE's for spasticity management; added hip internal rotation stretch (caregiver-assisted); see Pt Instructions for details. Also discussed the following: positioning of pt's w/c to promote active cervical spine rotation to R; progressive increase of pt tolerance to supine position; and orthotist consult to assess if pt would be appropriate for GRAFO's.   Person(s) Educated Patient;Spouse;Caregiver(s)   Methods Explanation;Handout   Comprehension Verbalized understanding          PT Short Term Goals - 10/29/15 2042      PT SHORT TERM GOAL #1   Title Pt will initiate anterior weight shift sufficient to unweight hips with supervision, 25% cueing in preparation for functional transfer.  (Target date: 10/21/15)   Baseline Can initiate with min A inconsistently with 50% cuing.     Status Partially Met     PT SHORT TERM GOAL #2   Title Pt will perform seated scooting (AP/PA and M/L) with supervision, 25% cueing to increase independence with transfers, repositioning. (10/21/15)   Baseline 10/4: Requires min-mod A, 25-50% cueing from lowest mat table height   Status Partially Met     PT SHORT TERM GOAL #3   Title Pt will tolerate static standing with total A of standing frame x10 minutes with vital signs WNL to improve standing tolerance and for tone management.  (10/21/15)   Baseline met    Status Achieved     PT SHORT TERM GOAL #4   Title Pt will perform dynamic sitting >/= 5 minutes with BLE's supported, intermittent UE support with CGA to indicate improved postural stability/control necessary for functional transfers. (10/21/15)   Status Achieved     PT SHORT TERM GOAL #5   Title Pt will perform sit <> side lying with mod A, 50% cueing to progress toward increased independence with bed mobility.  (10/21/15)   Baseline 9/22: R sidelying > sit with mod A.    Status  Achieved     PT SHORT TERM GOAL #6   Title Pt will consistently perform sit <> stand with min A, 25% cueing to indicate increased independence with functional transfers.  (10/21/15)   Baseline 10/4: Continues to require mod-max A    Status Not Met     PT SHORT TERM GOAL #7   Title Pt will perform level transfer from w/c <> mat table with mod A, 50% cueing to decrease caregiver burden with functional transfers.  (10/21/15)   Baseline mod/max A on 10/23/15, max A cues for sequencing and hand placement.    Status Partially Met           PT Long Term Goals - 09/23/15  2120      PT LONG TERM GOAL #1   Title Pt will perform supine > sit with mod A, 50% cueing to indicate increased independence with getting out of bed.  (Target: 11/18/15)     PT LONG TERM GOAL #2   Title Pt will perform sit > supine with mod A, 50% cueing to indicate increased independence with getting into bed.  (11/18/15)     PT LONG TERM GOAL #3   Title Pt will perform level transfer from w/c <> mat table with min A, 25% cueing to decrease caregiver burden with functional transfers.  (11/18/15)     PT LONG TERM GOAL #4   Title Pt will perform dynamic standing >/= 2 consecutive minutes with BUE support and mod, 50% cueing to indicate improved postural stanbility/control.   (11/18/15)     PT LONG TERM GOAL #5   Title Patient will ambulate x15' with max A of spouse using LRAD to enable pt/spouse to safely walk into/out of home bathroom.  (11/18/15)               Plan - 11/13/15 1721    Clinical Impression Statement Skilled session focused on active sit<>stand for imporved postural control, postural alignment, increased L hip extensibility in WB position, and active trunk rotation with UE use during session.  Pt with decreased arousal during session and difficult to engage.     Rehab Potential Good   Clinical Impairments Affecting Rehab Potential Positive: strong family/social support; negative: chronicity of  condition   PT Frequency 3x / week   PT Duration 8 weeks   PT Treatment/Interventions ADLs/Self Care Home Management;DME Instruction;Gait training;Stair training;Functional mobility training;Therapeutic activities;Patient/family education;Cognitive remediation;Neuromuscular re-education;Therapeutic exercise;Balance training;Orthotic Fit/Training;Wheelchair mobility training;Manual techniques;Splinting;Visual/perceptual remediation/compensation;Vestibular;Passive range of motion   PT Next Visit Plan Continue to address midline alignment, anterior weight shift, and LE activation. Also address L hip extension ROM, if safe/possible.   Consulted and Agree with Plan of Care Patient;Family member/caregiver   Family Member Consulted caregiver, [name] and husband, [name]      Patient will benefit from skilled therapeutic intervention in order to improve the following deficits and impairments:  Abnormal gait, Decreased balance, Decreased endurance, Decreased cognition, Cardiopulmonary status limiting activity, Decreased activity tolerance, Decreased coordination, Decreased strength, Impaired flexibility, Impaired tone, Decreased mobility, Decreased skin integrity, Increased muscle spasms, Impaired vision/preception, Decreased range of motion, Impaired UE functional use, Postural dysfunction, Pain  Visit Diagnosis: Abnormal posture  Muscle weakness (generalized)  Unsteadiness on feet  Other abnormalities of gait and mobility     Problem List Patient Active Problem List   Diagnosis Date Noted  . Tracheostomy tube present ([place])   . Tracheal stenosis   . Chronic respiratory failure with hypoxia ([place])   . 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[place]) 10/20/2015  . Tracheostomy status ([place]) 09/26/2015  . Allergic rhinitis 09/26/2015  . 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 Movement disorder 09/22/2015  . Encephalitis 09/22/2015  . Chest pain 02/07/2014  . Premature atrial contractions 01/13/2014  . Abnormality of gait  12/04/2013  . Hyperlipidemia 12/21/2011  . Cardiovascular risk factor 12/21/2011  . Bunion 01/31/2008  . Metatarsalgia of both feet 09/20/2007  . FLAT FOOT 09/20/2007  . HALLUX RIGIDUS, ACQUIRED 09/20/2007    Cameron Sprang, PT, MPT [facility] [address] [place], [place], [address] Phone: [phone]   Fax:  [phone] 11/13/15, 5:24 PM  Name: Judy Spears MRN: [id number] Date of Birth: 1950-04-28

## 2015-11-14 ENCOUNTER — Encounter: Payer: BC Managed Care – PPO | Admitting: Occupational Therapy

## 2015-11-14 ENCOUNTER — Ambulatory Visit: Payer: BC Managed Care – PPO | Admitting: Physical Therapy

## 2015-11-17 ENCOUNTER — Ambulatory Visit: Payer: BC Managed Care – PPO | Admitting: Occupational Therapy

## 2015-11-17 ENCOUNTER — Ambulatory Visit: Payer: BC Managed Care – PPO

## 2015-11-17 ENCOUNTER — Ambulatory Visit: Payer: BC Managed Care – PPO | Admitting: Physical Therapy

## 2015-11-17 ENCOUNTER — Encounter: Payer: Self-pay | Admitting: Occupational Therapy

## 2015-11-17 DIAGNOSIS — R471 Dysarthria and anarthria: Secondary | ICD-10-CM

## 2015-11-17 DIAGNOSIS — R2681 Unsteadiness on feet: Secondary | ICD-10-CM

## 2015-11-17 DIAGNOSIS — R293 Abnormal posture: Secondary | ICD-10-CM

## 2015-11-17 DIAGNOSIS — R2689 Other abnormalities of gait and mobility: Secondary | ICD-10-CM

## 2015-11-17 DIAGNOSIS — R278 Other lack of coordination: Secondary | ICD-10-CM

## 2015-11-17 DIAGNOSIS — R41841 Cognitive communication deficit: Secondary | ICD-10-CM

## 2015-11-17 DIAGNOSIS — R1312 Dysphagia, oropharyngeal phase: Secondary | ICD-10-CM

## 2015-11-17 DIAGNOSIS — M6281 Muscle weakness (generalized): Secondary | ICD-10-CM

## 2015-11-17 DIAGNOSIS — M25611 Stiffness of right shoulder, not elsewhere classified: Secondary | ICD-10-CM

## 2015-11-17 DIAGNOSIS — R41844 Frontal lobe and executive function deficit: Secondary | ICD-10-CM

## 2015-11-17 DIAGNOSIS — M25612 Stiffness of left shoulder, not elsewhere classified: Secondary | ICD-10-CM

## 2015-11-17 NOTE — Therapy (Signed)
[facility] [address] [place], [place], [address] Phone: [phone]   Fax:  [phone]  Occupational Therapy Treatment  Patient Details  Name: Judy Spears MRN: [id number] Date of Birth: March 15, 1950 Referring Provider: Dr. Sharion Dove  Encounter Date: 11/17/2015      OT End of Session - 11/17/15 1418    Visit Number 22   Number of Visits 24   Date for OT Re-Evaluation 11/18/15   Authorization Type BCBS unlimited visits   OT Start Time 1016   OT Stop Time 1100   OT Time Calculation (min) 44 min   Activity Tolerance Patient tolerated treatment well      Past Medical History:  Diagnosis Date  . Arthritis    lt great toe  . Complication of anesthesia    pt has had headaches post op-did advise to hydra well preop  . Hair loss    right-sided  . History of exercise stress test    a. ETT (12/15) with 12:00 exercise, no ST changes, occasional PACs.   . Hyperlipidemia   . Insomnia   . Migraine headache   . 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 Postmenopausal   . Premature atrial contractions    a. Holter (12/15) with 8% PACs, no atrial runs or atrial fibrillation.     Past Surgical History:  Procedure Laterality Date  . BREAST BIOPSY  01/01/2011   Procedure: BREAST BIOPSY WITH NEEDLE LOCALIZATION;  Surgeon: Rolm Bookbinder, MD;  Location: [facility];  Service: General;  Laterality: Left;  left breast wire localization biopsy  . cataracts right eye    . CESAREAN SECTION  1986  . HERNIA REPAIR  2000   RIH  . opirectinal membrane peel    . RHINOPLASTY  1983    There were no vitals filed for this visit.      Subjective Assessment - 11/17/15 1308    Subjective  "yes" when asked if she needed to use the bathroom   Patient is accompained by: --  caregiver [name]   Pertinent History see epic snapshot - ABI/hypoxia from tic bite/ virus   Patient Stated Goals Pt unable to state.     Currently in Pain? No/denies                      OT Treatments/Exercises (OP) - 11/17/15 0001      ADLs   Functional Mobility Pt needing to use toilet at beginning of session. Session addressed sit to stand using grab bar while completing pants down - pt was alert today and intiated leaning forward and grabbing bar after max cues and encouragement and guiding of hands.  Pt did not assist with sit to stand despite numeous attempts.  Pt dependent for sit to stand and standing while second person managed pants.  Total assist to pivot to toilet.  At end, pt required total a x2 for pants up and transfer back to wheelchair.  Caregiver reports that 5-6 weeks ago she was able to manage toilet transfers and clothes management by herself however was extremely difficult.  Caregiver states she has needed a second person for the past 5-6 weeks.  Discussed possible use of steady which would encourage and allow pt to be active in a the transfer but hopefully allow this to be a one person assist.  Provided picture to [name] and [name] to discuss with husband [name]. Also discussed suggestion of a  team mtg with [name] and [name] and she is in agreement.                    OT Short Term Goals - 11/17/15 1314      OT SHORT TERM GOAL #1   Title Pt and husband will be mod I with HEP - 10/21/2015   Status On-going     OT SHORT TERM GOAL #2   Title Pt will demonstrate adequate postural control for breath support to voice at least 3 words   Status Achieved     OT SHORT TERM GOAL #3   Title Pt will transfer to commode with moderate assistance 25% of the time.   Status On-going     OT SHORT TERM GOAL #4   Title Pt will tolerate positional changes as evidenced by respiration rate and motor quieting in preparation for funcitonal mobility   Status Achieved           OT Long Term Goals - 11/17/15 1315      OT LONG TERM GOAL #1   Title Pt and husband will be mod I with upgraded HEP - 11/18/2015    Status On-going     OT LONG TERM GOAL #2   Title Pt will demonstrate adequate postural control to support 3 words in supported sitting.   Status On-going     OT LONG TERM GOAL #3   Title Pt will demonstrate ability for low reach with RUE for functional task with  mod facilitation   Status Achieved     OT LONG TERM GOAL #4   Title Pt will wash face with LUE with min a in supported sitting   Status On-going     OT LONG TERM GOAL #5   Title Pt will release objects with L hand with min faciltation    Status On-going     OT LONG TERM GOAL #6   Title Pt will stand with min a while caregiver manages pants during LB dressing 75% of the time.   Status On-going     OT LONG TERM GOAL #7   Title Pt will transfer to commode with mod a 50% of the time.   Status On-going     OT LONG TERM GOAL #8   Title Pt will be able to sit unsupported with supervision 50% of the time while engaged in functional task.   Status On-going               Plan - 11/17/15 1315    Clinical Impression Statement Pt with very inconsistent performance and ability to engage from session to session.  Pt needing two person assist for toilet transfers   Rehab Potential Fair   Clinical Impairments Affecting Rehab Potential severity of deficits, slow rate of recovery   OT Frequency 3x / week  3x/wk x4 weeks then 2/wk x4 weeks   OT Duration 8 weeks   OT Treatment/Interventions Self-care/ADL training;Ultrasound;Moist Heat;Electrical Stimulation;DME and/or AE instruction;Neuromuscular education;Therapeutic exercise;Functional Mobility Training;Manual Therapy;Passive range of motion;Splinting;Therapeutic activities;Balance training;Patient/family education;Visual/perceptual remediation/compensation;Cognitive remediation/compensation   Plan LE activitation for sit to stand balance, alignment of trunk and LE's in standing, reaching for hip and trunk extension, simple ADL   Consulted and Agree with Plan of Care  Patient;Family member/caregiver   Family Member Consulted caregiver [name], husband - [name]      Patient will benefit from skilled therapeutic intervention in order to improve the following deficits and impairments:  Decreased endurance, Decreased skin integrity,  Impaired vision/preception, Improper body mechanics, Impaired perceived functional ability, Decreased knowledge of precautions, Cardiopulmonary status limiting activity, Decreased activity tolerance, Decreased knowledge of use of DME, Decreased strength, Impaired flexibility, Improper spinal/pelvic alignment, Impaired sensation, Difficulty walking, Decreased mobility, Decreased balance, Decreased cognition, Decreased range of motion, Impaired tone, Pain, Impaired UE functional use, Decreased safety awareness, Decreased coordination  Visit Diagnosis: Abnormal posture - Plan: Ot plan of care cert/re-cert  Muscle weakness (generalized) - Plan: Ot plan of care cert/re-cert  Other lack of coordination - Plan: Ot plan of care cert/re-cert  Stiffness of right shoulder, not elsewhere classified - Plan: Ot plan of care cert/re-cert  Stiffness of left shoulder, not elsewhere classified - Plan: Ot plan of care cert/re-cert  Frontal lobe and executive function deficit - Plan: Ot plan of care cert/re-cert  Unsteadiness on feet - Plan: Ot plan of care cert/re-cert    Problem List Patient Active Problem List   Diagnosis Date Noted  . Tracheostomy tube present ([place])   . Tracheal stenosis   . Chronic respiratory failure with hypoxia ([place])   . 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[place]) 10/20/2015  . Tracheostomy status ([place]) 09/26/2015  . Allergic rhinitis 09/26/2015  . 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 Movement disorder 09/22/2015  . Encephalitis 09/22/2015  . Chest pain 02/07/2014  . Premature atrial contractions 01/13/2014  . Abnormality of gait 12/04/2013  . Hyperlipidemia 12/21/2011  . Cardiovascular risk factor 12/21/2011  . Bunion 01/31/2008  .  Metatarsalgia of both feet 09/20/2007  . FLAT FOOT 09/20/2007  . HALLUX RIGIDUS, ACQUIRED 09/20/2007    Quay Burow, OTR/L 11/17/2015, 2:23 PM  [facility] [address] [place], [place], [address] Phone: [phone]   Fax:  [phone]  Name: Judy Spears MRN: [id number] Date of Birth: 07-04-1950

## 2015-11-17 NOTE — Therapy (Signed)
[facility] [address] [place], [place], [address] Phone: [phone]   Fax:  [phone]  Physical Therapy Treatment  Patient Details  Name: Judy Spears MRN: [id number] Date of Birth: [birth year] Referring Provider: Faith Rogue, MD Mitchell Heir, MD)  Encounter Date: 11/17/2015      PT End of Session - 11/17/15 2007    Visit Number 24   Number of Visits 25   Date for PT Re-Evaluation 11/22/15   Authorization Type BCBS   PT Start Time 0935   PT Stop Time 1015   PT Time Calculation (min) 40 min   Activity Tolerance Patient tolerated treatment well   Behavior During Therapy [facility] for tasks assessed/performed      Past Medical History:  Diagnosis Date  . Arthritis    lt great toe  . Complication of anesthesia    pt has had headaches post op-did advise to hydra well preop  . Hair loss    right-sided  . History of exercise stress test    a. ETT (12/15) with 12:00 exercise, no ST changes, occasional PACs.   . Hyperlipidemia   . Insomnia   . Migraine headache   . 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 Postmenopausal   . Premature atrial contractions    a. Holter (12/15) with 8% PACs, no atrial runs or atrial fibrillation.     Past Surgical History:  Procedure Laterality Date  . BREAST BIOPSY  01/01/2011   Procedure: BREAST BIOPSY WITH NEEDLE LOCALIZATION;  Surgeon: Emelia Loron, MD;  Location: [facility];  Service: General;  Laterality: Left;  left breast wire localization biopsy  . cataracts right eye    . CESAREAN SECTION  1986  . HERNIA REPAIR  2000   RIH  . opirectinal membrane peel    . RHINOPLASTY  1983    There were no vitals filed for this visit.      Subjective Assessment - 11/17/15 2003    Subjective "It was good."  (Pt mouthed this response to "How was the wedding?")   Patient is accompained by: --  caregiver, Misty Stanley   Pertinent History Precautions:  PEG,  seizure. PMH significant for: viral encephalitis due to [place] Virus (01/02/16), acute respiratory failure, R vocal cord paralysis, tracheal stenosis s/p repair on 07/08/15, s/p Botox injections in B ankle plantarflexors and L SCM/scalenes    Patient Stated Goals Per husband, "For [name] to be as independent as possible."   Currently in Pain? No/denies                         [facility] Adult PT Treatment/Exercise - 11/17/15 0001      Transfers   Transfers Sit to Stand;Stand to Sit   Sit to Stand 2: Max assist;3: Mod assist   Sit to Stand Details (indicate cue type and reason) with BUE support at // bars; from standard tilt-in-space w/c. Performed both with and without current B articulating custom AFO's to enable orthotist to assess.   Stand to Sit 3: Mod assist;4: Min assist   Stand to Sit Details to tilt-in-space w/c with BUE support at //bars     Balance   Balance Assessed Yes     Static Standing Balance   Static Standing - Balance Support Bilateral upper extremity supported   Static Standing - Level of Assistance 4: Min assist;3: Mod assist   Static Standing - Comment/# of Minutes Multiple  trials of static standing with BUE support at // bars (and with B HHA of therapist facing pt) for 30 seconds to 2 minutes consecutively, per trial, as orthotist concurrently assessed standing posture, LE tone (especially LLE) to assist in determining appropriate orthotics.                PT Education - 11/17/15 2004    Education provided Yes   Education Details Explained rationale for considering new orthotics with emphasis on providing block/fulcrum at anterior tibias to maximize pt independence with functional transfers, standing. Discussed barrier/considerations, with emphasis on risk of skin breakdown.    Person(s) Educated Patient;Caregiver(s)   Methods Explanation   Comprehension Verbalized understanding          PT Short Term Goals - 10/29/15 2042      PT SHORT TERM  GOAL #1   Title Pt will initiate anterior weight shift sufficient to unweight hips with supervision, 25% cueing in preparation for functional transfer.  (Target date: 10/21/15)   Baseline Can initiate with min A inconsistently with 50% cuing.     Status Partially Met     PT SHORT TERM GOAL #2   Title Pt will perform seated scooting (AP/PA and M/L) with supervision, 25% cueing to increase independence with transfers, repositioning. (10/21/15)   Baseline 10/4: Requires min-mod A, 25-50% cueing from lowest mat table height   Status Partially Met     PT SHORT TERM GOAL #3   Title Pt will tolerate static standing with total A of standing frame x10 minutes with vital signs WNL to improve standing tolerance and for tone management.  (10/21/15)   Baseline met    Status Achieved     PT SHORT TERM GOAL #4   Title Pt will perform dynamic sitting >/= 5 minutes with BLE's supported, intermittent UE support with CGA to indicate improved postural stability/control necessary for functional transfers. (10/21/15)   Status Achieved     PT SHORT TERM GOAL #5   Title Pt will perform sit <> side lying with mod A, 50% cueing to progress toward increased independence with bed mobility.  (10/21/15)   Baseline 9/22: R sidelying > sit with mod A.    Status Achieved     PT SHORT TERM GOAL #6   Title Pt will consistently perform sit <> stand with min A, 25% cueing to indicate increased independence with functional transfers.  (10/21/15)   Baseline 10/4: Continues to require mod-max A    Status Not Met     PT SHORT TERM GOAL #7   Title Pt will perform level transfer from w/c <> mat table with mod A, 50% cueing to decrease caregiver burden with functional transfers.  (10/21/15)   Baseline mod/max A on 10/23/15, max A cues for sequencing and hand placement.    Status Partially Met           PT Long Term Goals - 09/23/15 2120      PT LONG TERM GOAL #1   Title Pt will perform supine > sit with mod A, 50% cueing to  indicate increased independence with getting out of bed.  (Target: 11/18/15)     PT LONG TERM GOAL #2   Title Pt will perform sit > supine with mod A, 50% cueing to indicate increased independence with getting into bed.  (11/18/15)     PT LONG TERM GOAL #3   Title Pt will perform level transfer from w/c <> mat table with min A, 25% cueing  to decrease caregiver burden with functional transfers.  (11/18/15)     PT LONG TERM GOAL #4   Title Pt will perform dynamic standing >/= 2 consecutive minutes with BUE support and mod, 50% cueing to indicate improved postural stanbility/control.   (11/18/15)     PT LONG TERM GOAL #5   Title Patient will ambulate x15' with max A of spouse using LRAD to enable pt/spouse to safely walk into/out of home bathroom.  (11/18/15)               Plan - 11/17/15 2009    Clinical Impression Statement Orthotist, Brooke Pace, present for this session to assess transfers, standing posture and discuss bracing options. After discussion, all in agreement to pursue B GRAFO's with clamshell closure to promote LE extension while distributing pressure to prevent skin breakdown. Caregiver, [name], to convey this information to husband, [name], and schedule in-office casting with [employer].    Rehab Potential Good   Clinical Impairments Affecting Rehab Potential Positive: strong family/social support; negative: chronicity of condition   PT Frequency 3x / week   PT Duration 8 weeks   PT Treatment/Interventions ADLs/Self Care Home Management;DME Instruction;Gait training;Stair training;Functional mobility training;Therapeutic activities;Patient/family education;Cognitive remediation;Neuromuscular re-education;Therapeutic exercise;Balance training;Orthotic Fit/Training;Wheelchair mobility training;Manual techniques;Splinting;Visual/perceptual remediation/compensation;Vestibular;Passive range of motion   PT Next Visit Plan Assess goals and recert. Consider 3x/week for 4 weeks  followed by 2x/week for 4 weeks. Discuss with husband/caregiver current physical burden, assist with transfers at home.   Consulted and Agree with Plan of Care Patient;Family member/caregiver   Family Member Consulted caregiver, [name]       Patient will benefit from skilled therapeutic intervention in order to improve the following deficits and impairments:  Abnormal gait, Decreased balance, Decreased endurance, Decreased cognition, Cardiopulmonary status limiting activity, Decreased activity tolerance, Decreased coordination, Decreased strength, Impaired flexibility, Impaired tone, Decreased mobility, Decreased skin integrity, Increased muscle spasms, Impaired vision/preception, Decreased range of motion, Impaired UE functional use, Postural dysfunction, Pain  Visit Diagnosis: Other abnormalities of gait and mobility  Abnormal posture  Other lack of coordination  Unsteadiness on feet  Muscle weakness (generalized)     Problem List Patient Active Problem List   Diagnosis Date Noted  . Tracheostomy tube present ([place])   . Tracheal stenosis   . Chronic respiratory failure with hypoxia ([place])   . 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[place]) 10/20/2015  . Tracheostomy status ([place]) 09/26/2015  . Allergic rhinitis 09/26/2015  . 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 Movement disorder 09/22/2015  . Encephalitis 09/22/2015  . Chest pain 02/07/2014  . Premature atrial contractions 01/13/2014  . Abnormality of gait 12/04/2013  . Hyperlipidemia 12/21/2011  . Cardiovascular risk factor 12/21/2011  . Bunion 01/31/2008  . Metatarsalgia of both feet 09/20/2007  . FLAT FOOT 09/20/2007  . HALLUX RIGIDUS, ACQUIRED 09/20/2007    Billie Ruddy, PT, DPT [facility] [address] [place], [place], [address] Phone: [phone]   Fax:  [phone] 11/17/15, 8:18 PM  Name: Judy Spears MRN: [id number] Date of Birth: April 26, 1950

## 2015-11-17 NOTE — Therapy (Signed)
[facility] [address] [place], [place], [address] Phone: [phone]   Fax:  [phone]  Speech Language Pathology Treatment  Patient Details  Name: Judy Spears MRN: [id number] Date of Birth: 12/03/1950 Referring Provider: Dr. Alger Simons  Encounter Date: 11/17/2015      End of Session - 11/17/15 1705    Visit Number 22   Number of Visits 24   Date for SLP Re-Evaluation 11/18/15   SLP Start Time 1318   SLP Stop Time  1400   SLP Time Calculation (min) 42 min   Activity Tolerance Patient tolerated treatment well      Past Medical History:  Diagnosis Date  . Arthritis    lt great toe  . Complication of anesthesia    pt has had headaches post op-did advise to hydra well preop  . Hair loss    right-sided  . History of exercise stress test    a. ETT (12/15) with 12:00 exercise, no ST changes, occasional PACs.   . Hyperlipidemia   . Insomnia   . Migraine headache   . 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 Postmenopausal   . Premature atrial contractions    a. Holter (12/15) with 8% PACs, no atrial runs or atrial fibrillation.     Past Surgical History:  Procedure Laterality Date  . BREAST BIOPSY  01/01/2011   Procedure: BREAST BIOPSY WITH NEEDLE LOCALIZATION;  Surgeon: Rolm Bookbinder, MD;  Location: [place];  Service: General;  Laterality: Left;  left breast wire localization biopsy  . cataracts right eye    . CESAREAN SECTION  1986  . HERNIA REPAIR  2000   RIH  . opirectinal membrane peel    . RHINOPLASTY  1983    There were no vitals filed for this visit.      Subjective Assessment - 11/17/15 1631    Subjective Mouthed, "Hi!" after SLP "Hi!"   Patient is accompained by: Family member  caregiver and spouse   Currently in Pain? No/denies               ADULT SLP TREATMENT - 11/17/15 1632      General Information   Behavior/Cognition  Alert;Cooperative;Pleasant mood   Patient Positioning Upright in chair     Treatment Provided   Treatment provided Cognitive-Linquistic     Cognitive-Linquistic Treatment   Treatment focused on Voice;Dysarthria   Skilled Treatment Pt awake for approx 25 total minutes ST with tactile cues for wakefulness. SLP elicited voicing from pt with communicative vowels ("oooo", " uhhuh", "uhuh", "ohhhh") <10% in upright position. In order to increase frequency of verbalization/vocalization, SLP had pt both recline at approx 80degree angle, and feel (with hand over hand assist) feel her thyroid cartilage for vocal fold vibrations in reclined position, which occurred in imitation approx 80% of the time. Pt's tactile stimulation in the reclined position did not appear to assist pt's frequency of verbalization/vocalization in upright position, as frequency of vocalization with max verbal, demo cues did not change with pt's tactile cue of touching thyroid. Pt noted with wet voice throughout session, intermittently. SLP used ice chip for pt to swallow x1, and immediately afterwards a gurgle from behind tracheostomy bandage was audible. Pt noted to perseveratively answer with daughter's name when SLP asked her the daughter's children's names.     Assessment / Recommendations / Plan   Plan Continue with current plan of care     Progression  Toward Goals   Progression toward goals --  pt appears focused to meet goals            SLP Short Term Goals - 11/12/15 1720      SLP SHORT TERM GOAL #1   Title Pt will demonstrate audible phonation with words, vowels, or syllables in 4/10 trials with max A   Status On-going     SLP SHORT TERM GOAL #2   Title Pt will demonstrate breath support with max A, prior to repetition of words 4/10   Status On-going     SLP SHORT TERM GOAL #3   Title Pt will swallow ice chip/H20 via spoon in less than 7 seconds with usual min to mod A 3/5 trials   Status On-going     SLP  SHORT TERM GOAL #4   Title Pt will sustain attention to therapy task for over 7 minutes with occasional min A   Status On-going          SLP Long Term Goals - 11/17/15 1708      SLP LONG TERM GOAL #1   Title Pt will demonstrate audible phonation/intelligible speech with 2 word phrases with occasional min A 5/7 utterances   Time 1   Period Weeks   Status On-going     SLP LONG TERM GOAL #2   Title Pt will demonstrate audible cough/throat clear to command and to clear secretions with occasional min A 4/5 trials.    Time 1   Period Weeks   Status On-going     SLP LONG TERM GOAL #3   Title Pt will achieve timely oral phase and swallow with ice chip/ water trials 5/7 trials   Time 1   Period Weeks   Status On-going     SLP LONG TERM GOAL #4   Title Pt will sustain attention to therapy tasks for 12 minutes with occasiona min A   Time 1   Period Weeks   Status On-going          Plan - 11/17/15 1706    Clinical Impression Statement Focus on speech/voicing today. Pt was able to remain alert for 25-30 minutes of 35 minute session, using tactile stimulation. Persistent significant deficits in speech and swallowing exist, and pt is now more awake and alert in the last 3 ST sessions. Skilled SLP services cont to be warranted due to incr'd pt responsiveness and thus ability to work on ST goals. ST would be good to continue in order to decrease caregiver burden by maximizing pt function.   Speech Therapy Frequency 3x / week   Duration --  2 more visits   Treatment/Interventions Aspiration precaution training;Trials of upgraded texture/liquids;Compensatory strategies;Functional tasks;Patient/family education;Cueing hierarchy;Diet toleration management by SLP;Cognitive reorganization;Compensatory techniques;SLP instruction and feedback;Internal/external aids;Multimodal communcation approach   Potential to Achieve Goals Fair   Potential Considerations Ability to learn/carryover  information;Co-morbidities;Severity of impairments      Patient will benefit from skilled therapeutic intervention in order to improve the following deficits and impairments:   Dysphagia, oropharyngeal phase  Dysarthria and anarthria  Cognitive communication deficit    Problem List Patient Active Problem List   Diagnosis Date Noted  . Tracheostomy tube present ([place])   . Tracheal stenosis   . Chronic respiratory failure with hypoxia ([place])   . 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[place]) 10/20/2015  . Tracheostomy status ([place]) 09/26/2015  . Allergic rhinitis 09/26/2015  . 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 Movement disorder 09/22/2015  . Encephalitis 09/22/2015  . Chest  pain 02/07/2014  . Premature atrial contractions 01/13/2014  . Abnormality of gait 12/04/2013  . Hyperlipidemia 12/21/2011  . Cardiovascular risk factor 12/21/2011  . Bunion 01/31/2008  . Metatarsalgia of both feet 09/20/2007  . FLAT FOOT 09/20/2007  . HALLUX RIGIDUS, ACQUIRED 09/20/2007    [facility] ,MS, CCC-SLP  11/17/2015, 5:42 PM  [facility] [address] [place], [place], [address] Phone: [phone]   Fax:  [phone]   Name: Judy Spears MRN: [id number] Date of Birth: [birth year]

## 2015-11-17 NOTE — Patient Instructions (Signed)
Educated on possible use of steady for home transfers

## 2015-11-19 ENCOUNTER — Encounter: Payer: Self-pay | Admitting: Occupational Therapy

## 2015-11-19 ENCOUNTER — Ambulatory Visit: Payer: BC Managed Care – PPO

## 2015-11-19 ENCOUNTER — Telehealth: Payer: Self-pay | Admitting: Pulmonary Disease

## 2015-11-19 ENCOUNTER — Ambulatory Visit: Payer: BC Managed Care – PPO | Admitting: Physical Therapy

## 2015-11-19 ENCOUNTER — Ambulatory Visit: Payer: BC Managed Care – PPO | Admitting: Occupational Therapy

## 2015-11-19 DIAGNOSIS — R2689 Other abnormalities of gait and mobility: Secondary | ICD-10-CM

## 2015-11-19 DIAGNOSIS — R293 Abnormal posture: Secondary | ICD-10-CM

## 2015-11-19 DIAGNOSIS — M25612 Stiffness of left shoulder, not elsewhere classified: Secondary | ICD-10-CM

## 2015-11-19 DIAGNOSIS — M6281 Muscle weakness (generalized): Secondary | ICD-10-CM

## 2015-11-19 DIAGNOSIS — R41841 Cognitive communication deficit: Secondary | ICD-10-CM

## 2015-11-19 DIAGNOSIS — R41844 Frontal lobe and executive function deficit: Secondary | ICD-10-CM

## 2015-11-19 DIAGNOSIS — R278 Other lack of coordination: Secondary | ICD-10-CM

## 2015-11-19 DIAGNOSIS — R2681 Unsteadiness on feet: Secondary | ICD-10-CM

## 2015-11-19 DIAGNOSIS — M25652 Stiffness of left hip, not elsewhere classified: Secondary | ICD-10-CM

## 2015-11-19 DIAGNOSIS — M25611 Stiffness of right shoulder, not elsewhere classified: Secondary | ICD-10-CM

## 2015-11-19 DIAGNOSIS — R471 Dysarthria and anarthria: Secondary | ICD-10-CM

## 2015-11-19 DIAGNOSIS — R1312 Dysphagia, oropharyngeal phase: Secondary | ICD-10-CM

## 2015-11-19 NOTE — Therapy (Signed)
[facility] [address] [place], [place], [address] Phone: [phone]   Fax:  [phone]  Physical Therapy Treatment  Patient Details  Name: Judy Spears MRN: [id number] Date of Birth: 1951/01/05 Referring Provider: Alger Simons, MD   Encounter Date: 11/19/2015      PT End of Session - 11/19/15 1811    Visit Number 25   Number of Visits 45  requesting 20 additional visits   Date for PT Re-Evaluation 01/18/16   Authorization Type [employer]   PT Start Time 0930   PT Stop Time 1015   PT Time Calculation (min) 45 min   Activity Tolerance Patient tolerated treatment well   Behavior During Therapy [facility] for tasks assessed/performed      Past Medical History:  Diagnosis Date  . Arthritis    lt great toe  . Complication of anesthesia    pt has had headaches post op-did advise to hydra well preop  . Hair loss    right-sided  . History of exercise stress test    a. ETT (12/15) with 12:00 exercise, no ST changes, occasional PACs.   . Hyperlipidemia   . Insomnia   . Migraine headache   . 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 Postmenopausal   . Premature atrial contractions    a. Holter (12/15) with 8% PACs, no atrial runs or atrial fibrillation.     Past Surgical History:  Procedure Laterality Date  . BREAST BIOPSY  01/01/2011   Procedure: BREAST BIOPSY WITH NEEDLE LOCALIZATION;  Surgeon: Rolm Bookbinder, MD;  Location: [facility];  Service: General;  Laterality: Left;  left breast wire localization biopsy  . cataracts right eye    . CESAREAN SECTION  1986  . HERNIA REPAIR  2000   RIH  . opirectinal membrane peel    . RHINOPLASTY  1983    There were no vitals filed for this visit.      Subjective Assessment - 11/19/15 1809    Subjective Pt arrived to session accompanied by husband, [name], and caregiver, [name]. Pt awake, alert.   Patient is accompained by: Family member  husband and  caregiver   Pertinent History Precautions:  PEG, seizure. 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 No/denies                                 PT Education - 11/19/15 1810    Education provided Yes   Education Details Discussed with husband that treatment team plans to have meeting within next week to discuss POC. Discussed decreasing PT frequency from 3x/week to 2x/week.   Person(s) Educated Theatre stage manager)   Methods Explanation   Comprehension Verbalized understanding          PT Short Term Goals - 10/29/15 2042      PT SHORT TERM GOAL #1   Title Pt will initiate anterior weight shift sufficient to unweight hips with supervision, 25% cueing in preparation for functional transfer.  (Target date: 10/21/15)   Baseline Can initiate with min A inconsistently with 50% cuing.     Status Partially Met  PT SHORT TERM GOAL #2   Title Pt will perform seated scooting (AP/PA and M/L) with supervision, 25% cueing to increase independence with transfers, repositioning. (10/21/15)   Baseline 10/4: Requires min-mod A, 25-50% cueing from lowest mat table height   Status Partially Met     PT SHORT TERM GOAL #3   Title Pt will tolerate static standing with total A of standing frame x10 minutes with vital signs WNL to improve standing tolerance and for tone management.  (10/21/15)   Baseline met    Status Achieved     PT SHORT TERM GOAL #4   Title Pt will perform dynamic sitting >/= 5 minutes with BLE's supported, intermittent UE support with CGA to indicate improved postural stability/control necessary for functional transfers. (10/21/15)   Status Achieved     PT SHORT TERM GOAL #5   Title  Pt will perform sit <> side lying with mod A, 50% cueing to progress toward increased independence with bed mobility.  (10/21/15)   Baseline 9/22: R sidelying > sit with mod A.    Status Achieved     PT SHORT TERM GOAL #6   Title Pt will consistently perform sit <> stand with min A, 25% cueing to indicate increased independence with functional transfers.  (10/21/15)   Baseline 10/4: Continues to require mod-max A    Status Not Met     PT SHORT TERM GOAL #7   Title Pt will perform level transfer from w/c <> mat table with mod A, 50% cueing to decrease caregiver burden with functional transfers.  (10/21/15)   Baseline mod/max A on 10/23/15, max A cues for sequencing and hand placement.    Status Partially Met           PT Long Term Goals - 11/19/15 1812      PT LONG TERM GOAL #1   Title Pt will perform supine > sit with mod A, 50% cueing to indicate increased independence with getting out of bed.  (Target: 11/18/15)   Baseline 10/15: Supine > sit with mod A for transition from R sidelying > sit.   Status Achieved     PT LONG TERM GOAL #2   Title Pt will perform sit > supine with mod A, 50% cueing to indicate increased independence with getting into bed.  (11/18/15)   Baseline 10/25:  sit > supine with mod to max A to control descent from sit > R sidelying and to initiate transition from R sidelying > supine.   Status Partially Met     PT LONG TERM GOAL #3   Title Pt will perform level transfer from w/c <> mat table with min A, 25% cueing to decrease caregiver burden with functional transfers.  (11/18/15)   Baseline 10/25: Max A lateral slideboard transfer from w/c > mat table   Status Not Met     PT LONG TERM GOAL #4   Title Pt will perform dynamic standing >/= 2 consecutive minutes with BUE support and mod, 50% cueing to indicate improved postural stanbility/control.   (11/18/15)   Baseline 10/25: Has demonstrated in past sessions, but inconsistent.   Status Partially Met     PT  LONG TERM GOAL #5   Title Patient will ambulate x15' with max A of spouse using LRAD to enable pt/spouse to safely walk into/out of home bathroom.  (11/18/15)   Baseline 10/25: Ambulation has not been a focus of this episode of PT due to emphasis being on midline orientation, LE  activation, anterior weight shift, and postural control.   Status Not Met       Updated goals:      PT Short Term Goals - 11/19/15 2029      PT SHORT TERM GOAL #1   Title Pt will consistently perform supine rolling to in B directions with supervision, 25% cueing to indicate increased trunk dissociation, increased independence with bed mobility.  (Target date:12/17/15)    Time 4   Period Weeks   Status New     PT SHORT TERM GOAL #2   Title Pt will consistently perform supine <> sit with min A, 25% cueing to indicate increased independence getting out of bed. (12/17/15)   Time 4   Period Weeks   Status New     PT SHORT TERM GOAL #3   Title Pt will demonstrate midline posture in static sitting with BLE support with 50% verbal cueing to indicate increased postural awareness/control.  (12/17/15)   Time 4   Period Weeks   Status New     PT SHORT TERM GOAL #4   Title Pt will consistently perform level transfers from w/c <> mat table with mod A, 50% cueing to decrease caregiver burden.  (12/17/15)   Time 4   Period Weeks   Status New     PT SHORT TERM GOAL #5   Title Pt will consistently perform sit <> stand from personal w/c with BUE support and mod A, 50% cueing to decrease caregiver burden.  (12/17/15)   Time 4   Period Weeks   Status New     Additional Short Term Goals   Additional Short Term Goals Yes     PT SHORT TERM GOAL #6   Title Pt will perform static standing with BUE support x1 minute with supervision, no overt LOB to decrease caregiver burden.  (12/17/15)   Time 4   Period Weeks   Status New     PT SHORT TERM GOAL #7   Title PT will assess current wheelchair and determine if  alternative seating system would better maximize pt safety with functional mobility.  (12/17/15)   Time 4   Period Weeks   Status New          PT Long Term Goals - 11/19/15 2034      PT LONG TERM GOAL #1   Title Pt will consistently perform supine <> sit with supervision, 25% cueing to indicate increased independence getting out of bed.   (Target: 01/14/16)   Time 8   Period Weeks   Status New     PT LONG TERM GOAL #2   Title Pt will demonstrate midline posture in static sitting with BLE support with subtle to min cueing to indicate increased postural awareness/control. (01/14/16)   Time 8   Period Weeks   Status New     PT LONG TERM GOAL #3   Title Pt will consistently perform level transfers from w/c <> mat table with min A, 25% cueing to decrease caregiver burden.  (01/14/16)   Time 8   Period Weeks   Status New     PT LONG TERM GOAL #4   Title Pt will consistently perform sit <> stand from personal w/c with BUE support and min A, 25% cueing to decrease caregiver burden.  (01/14/16)   Time 8   Period Weeks   Status New     PT LONG TERM GOAL #5   Title PT will assess gait to determine if ambulation  is safe/functional.  (01/14/16)   Time 8   Period Weeks   Status New                Plan - 11/19/15 2020    Clinical Impression Statement During this episode of PT, the patient has demonstrated the following improvements in terms of functional mobility: increased initiation of seated anterior weight shift; increased ability to utilize cueing to initiate self-correction of postural alignment (e.g. using cervical spine lateral flexion in attempt to achieve more midline posture).  Pt continues to require multimodal cueing (and often hands-on assist) of a skilled PT to perform the following aspects of functional mobility: supine rolling; supine <> sit; midline postural alignment in seated and standing; full anterior weight shift and LE muscle activation required for sit >  stand. The following have been barriers to progress: significant abnormal tone/spasticity (in LLE > RLE), multiple UTI's, and decreased arousal. Spasticity/LE "bouncing" has improved since Botox injections were given on 10/11. Orthotist has also been consulted for more appropriate AFO's to address spasticity and increase independence with functional transfers/standing. Pt will continue to benefit from skilled outpatient PT 3x/week for 4 weeks followed by 2x/week for subsequent 4 weeks to address said impairments.    Rehab Potential Fair   Clinical Impairments Affecting Rehab Potential Positive: strong family/social support; negative: chronicity of condition   PT Frequency 3x / week  followed by 2x/week for 4 weeks   PT Duration 4 weeks  followed by 2x/week for 4 weeks   PT Treatment/Interventions ADLs/Self Care Home Management;DME Instruction;Gait training;Stair training;Functional mobility training;Therapeutic activities;Patient/family education;Cognitive remediation;Neuromuscular re-education;Therapeutic exercise;Balance training;Orthotic Fit/Training;Wheelchair mobility training;Manual techniques;Splinting;Visual/perceptual remediation/compensation;Vestibular;Passive range of motion   PT Next Visit Plan Assess effectiveness of modified AFO's and convey to [facility] to determine if new GRAFO's are appropriate.   Consulted and Agree with Plan of Care Patient;Family member/caregiver   Family Member Consulted caregiver, [name] and husband, [name]      Patient will benefit from skilled therapeutic intervention in order to improve the following deficits and impairments:  Abnormal gait, Decreased balance, Decreased endurance, Decreased cognition, Cardiopulmonary status limiting activity, Decreased activity tolerance, Decreased coordination, Decreased strength, Impaired flexibility, Impaired tone, Decreased mobility, Decreased skin integrity, Increased muscle spasms, Impaired vision/preception, Decreased range of  motion, Impaired UE functional use, Postural dysfunction, Pain  Visit Diagnosis: Other abnormalities of gait and mobility  Unsteadiness on feet  Stiffness of left hip, not elsewhere classified  Abnormal posture  Other lack of coordination  Muscle weakness (generalized)     Problem List Patient Active Problem List   Diagnosis Date Noted  . Tracheostomy tube present ([place])   . Tracheal stenosis   . Chronic respiratory failure with hypoxia ([place])   . 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[place]) 10/20/2015  . Tracheostomy status ([place]) 09/26/2015  . Allergic rhinitis 09/26/2015  . 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 Movement disorder 09/22/2015  . Encephalitis 09/22/2015  . Chest pain 02/07/2014  . Premature atrial contractions 01/13/2014  . Abnormality of gait 12/04/2013  . Hyperlipidemia 12/21/2011  . Cardiovascular risk factor 12/21/2011  . Bunion 01/31/2008  . Metatarsalgia of both feet 09/20/2007  . FLAT FOOT 09/20/2007  . HALLUX RIGIDUS, ACQUIRED 09/20/2007    Billie Ruddy, PT, DPT [facility] [address] [place], [place], [address] Phone: [phone]   Fax:  [phone] 11/19/15, 8:38 PM  Name: NAYDENE KAMROWSKI MRN: [id number] Date of Birth: 20-Jul-1950

## 2015-11-19 NOTE — Patient Instructions (Signed)
Focus on having Judy Spears make sounds at home while lying down and have her feel her "buzz" in her throat - 10-15 minutes a couple times each day.

## 2015-11-19 NOTE — Therapy (Signed)
[facility] [address] [place], [place], [address] Phone: [phone]   Fax:  [phone]  Occupational Therapy Treatment  Patient Details  Name: Judy Spears MRN: [id number] Date of Birth: 05-02-1950 Referring Provider: Dr. Sharion Dove  Encounter Date: 11/19/2015      OT End of Session - 11/19/15 1254    Visit Number 23   Number of Visits 44   Date for OT Re-Evaluation 01/25/16   OT Start Time 1015   OT Stop Time 1100   OT Time Calculation (min) 45 min   Activity Tolerance Patient tolerated treatment well   Behavior During Therapy [facility] for tasks assessed/performed      Past Medical History:  Diagnosis Date  . Arthritis    lt great toe  . Complication of anesthesia    pt has had headaches post op-did advise to hydra well preop  . Hair loss    right-sided  . History of exercise stress test    a. ETT (12/15) with 12:00 exercise, no ST changes, occasional PACs.   . Hyperlipidemia   . Insomnia   . Migraine headache   . 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 Postmenopausal   . Premature atrial contractions    a. Holter (12/15) with 8% PACs, no atrial runs or atrial fibrillation.     Past Surgical History:  Procedure Laterality Date  . BREAST BIOPSY  01/01/2011   Procedure: BREAST BIOPSY WITH NEEDLE LOCALIZATION;  Surgeon: Rolm Bookbinder, MD;  Location: [facility];  Service: General;  Laterality: Left;  left breast wire localization biopsy  . cataracts right eye    . CESAREAN SECTION  1986  . HERNIA REPAIR  2000   RIH  . opirectinal membrane peel    . RHINOPLASTY  1983    There were no vitals filed for this visit.      Subjective Assessment - 11/19/15 1154    Subjective  Patient alert and mouthing yes/no appropriately to questions   Patient is accompained by: Family member   Pertinent History see epic snapshot - ABI/hypoxia from tic bite/ virus   Patient Stated Goals Pt  unable to state.    Currently in Pain? No/denies   Pain Score 0-No pain                      OT Treatments/Exercises (OP) - 11/19/15 0001      ADLs   Toileting Patient's caregiver indicated that thereis a plan to purchase a sit to stand assist to aide with transfers at home.  Discussed that this could be an excellent tool to address partial stand to stand transitions as well.     Functional Mobility Completed slide board transfer to mat table with total assistance at beginning of session, max assist at end of session patient able to use hands to push / pull self toward target, minimal LE activation.       Neurological Re-education Exercises   Other Exercises 1 Neuromuscular reeducation to address lower body initiated weight shifts and LE activation.  Worked on rolling supine to sidelying with attempts to change pattern of movement so LE' s initiated transition.  Once aptient allowed to passively feel this motion, able to replicate - nice dissociation of upper and lower trunk, and nice flexion pattenring in trunk, LE's and UE's.  Worked on bridging to facilitate pushing legs into surface.  Patient initially attempting to bridge by extending  thoracic spine, head and neck into surface, able to correct with facilitation.  In sitting worked on postural control without use of arms for support.     Other Exercises 2 In sidelying worked on biomechanics of reach in right shoulder.  Patient with limited glenohumeral joint mobility, and limited ability to complete mid level reach in seated position.  In sidelying, following preparation of shoulder girdle able to complete mid level reach with minimal guidance.  Able to facilitate mid level reach in sitting with guided motion - modified closed chain.                  OT Education - 11/19/15 1251    Education provided Yes   Education Details Reinforced potentail benefits of sit to stand lift for functional transfers at home   Person(s)  Educated Patient;Caregiver(s)   Methods Explanation   Comprehension Verbalized understanding          OT Short Term Goals - 11/19/15 1257      OT SHORT TERM GOAL #1   Title Pt and husband will be mod I with HEP - 10/21/2015   Status On-going     OT SHORT TERM GOAL #2   Title Pt will demonstrate adequate postural control for breath support to voice at least 3 words   Status Achieved     OT SHORT TERM GOAL #3   Title Pt will transfer to commode with moderate assistance 25% of the time.   Status On-going     OT SHORT TERM GOAL #4   Title Pt will tolerate positional changes as evidenced by respiration rate and motor quieting in preparation for funcitonal mobility   Status Achieved           OT Long Term Goals - 11/19/15 1258      OT LONG TERM GOAL #1   Title Pt and husband will be mod I with upgraded HEP - 11/18/2015   Status On-going     OT LONG TERM GOAL #2   Title Pt will demonstrate adequate postural control to support 3 words in supported sitting.   Status On-going     OT LONG TERM GOAL #3   Title Pt will demonstrate ability for low reach with RUE for functional task with  mod facilitation   Status Achieved     OT LONG TERM GOAL #4   Title Pt will wash face with LUE with min a in supported sitting   Status On-going     OT LONG TERM GOAL #5   Title Pt will release objects with L hand with min faciltation    Status On-going     OT LONG TERM GOAL #6   Title Pt will stand with min a while caregiver manages pants during LB dressing 75% of the time.   Status On-going     OT LONG TERM GOAL #7   Title Pt will transfer to commode with mod a 50% of the time.   Status On-going     OT LONG TERM GOAL #8   Title Pt will be able to sit unsupported with supervision 50% of the time while engaged in functional task.   Status On-going               Plan - 11/19/15 1254    Clinical Impression Statement Patient with improved postural control for sitting, patient  with inconsistent ability to activate LE's for transitional movements.     Rehab Potential Fair   Clinical Impairments  Affecting Rehab Potential severity of deficits, slow rate of recovery   OT Frequency 3x / week   OT Duration 8 weeks  3x/4 wks, 2x/4 weeks   OT Treatment/Interventions Self-care/ADL training;Ultrasound;Moist Heat;Electrical Stimulation;DME and/or AE instruction;Neuromuscular education;Therapeutic exercise;Functional Mobility Training;Manual Therapy;Passive range of motion;Splinting;Therapeutic activities;Balance training;Patient/family education;Visual/perceptual remediation/compensation;Cognitive remediation/compensation   Plan LE activation, sit to stand with grab bar. partial stand to stand with grab bar, Trunk extension in standing   Consulted and Agree with Plan of Care Patient;Family member/caregiver   Family Member Consulted caregiver [name], husband - [name]      Patient will benefit from skilled therapeutic intervention in order to improve the following deficits and impairments:  Decreased endurance, Decreased skin integrity, Impaired vision/preception, Improper body mechanics, Impaired perceived functional ability, Decreased knowledge of precautions, Cardiopulmonary status limiting activity, Decreased activity tolerance, Decreased knowledge of use of DME, Decreased strength, Impaired flexibility, Improper spinal/pelvic alignment, Impaired sensation, Difficulty walking, Decreased mobility, Decreased balance, Decreased cognition, Decreased range of motion, Impaired tone, Pain, Impaired UE functional use, Decreased safety awareness, Decreased coordination  Visit Diagnosis: Muscle weakness (generalized)  Abnormal posture  Other lack of coordination  Stiffness of right shoulder, not elsewhere classified  Stiffness of left shoulder, not elsewhere classified  Frontal lobe and executive function deficit  Unsteadiness on feet    Problem List Patient Active Problem List    Diagnosis Date Noted  . Tracheostomy tube present ([place])   . Tracheal stenosis   . Chronic respiratory failure with hypoxia ([place])   . 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 Tracheostomy status ([place]) 09/26/2015  . Allergic rhinitis 09/26/2015  . 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 Movement disorder 09/22/2015  . Encephalitis 09/22/2015  . Chest pain 02/07/2014  . Premature atrial contractions 01/13/2014  . Abnormality of gait 12/04/2013  . Hyperlipidemia 12/21/2011  . Cardiovascular risk factor 12/21/2011  . Bunion 01/31/2008  . Metatarsalgia of both feet 09/20/2007  . FLAT FOOT 09/20/2007  . HALLUX RIGIDUS, ACQUIRED 09/20/2007    Mariah Milling, OTR/L 11/19/2015, 12:59 PM  [facility] [address] [place], [place], [address] Phone: [phone]   Fax:  [phone]  Name: Judy Spears MRN: [id number] Date of Birth: 05/21/1950

## 2015-11-19 NOTE — Telephone Encounter (Signed)
lmtcb for pt's husband.

## 2015-11-19 NOTE — Therapy (Signed)
[facility] [address] [place], [place], [address] Phone: [phone]   Fax:  [phone]  Speech Language Pathology Treatment  Patient Details  Name: Judy Spears MRN: [id number] Date of Birth: 1950-08-14 Referring Provider: Dr. Alger Simons  Encounter Date: 11/19/2015      End of Session - 11/19/15 1617    Visit Number 23   Date for SLP Re-Evaluation 11/18/15   SLP Start Time 1318   SLP Stop Time  1400   SLP Time Calculation (min) 42 min   Activity Tolerance Patient tolerated treatment well      Past Medical History:  Diagnosis Date  . Arthritis    lt great toe  . Complication of anesthesia    pt has had headaches post op-did advise to hydra well preop  . Hair loss    right-sided  . History of exercise stress test    a. ETT (12/15) with 12:00 exercise, no ST changes, occasional PACs.   . Hyperlipidemia   . Insomnia   . Migraine headache   . 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 Postmenopausal   . Premature atrial contractions    a. Holter (12/15) with 8% PACs, no atrial runs or atrial fibrillation.     Past Surgical History:  Procedure Laterality Date  . BREAST BIOPSY  01/01/2011   Procedure: BREAST BIOPSY WITH NEEDLE LOCALIZATION;  Surgeon: Rolm Bookbinder, MD;  Location: [facility];  Service: General;  Laterality: Left;  left breast wire localization biopsy  . cataracts right eye    . CESAREAN SECTION  1986  . HERNIA REPAIR  2000   RIH  . opirectinal membrane peel    . RHINOPLASTY  1983    There were no vitals filed for this visit.      Subjective Assessment - 11/19/15 1321    Subjective Mouthed "good afternoon" with SLP exclamation "Good afternoon!"   Patient is accompained by: Family member               ADULT SLP TREATMENT - 11/19/15 1402      General Information   Behavior/Cognition Alert;Cooperative;Pleasant mood     Treatment Provided   Treatment provided Cognitive-Linquistic     Pain Assessment   Pain Assessment --  no overt indications of pain     Cognitive-Linquistic Treatment   Treatment focused on Voice;Dysarthria   Skilled Treatment Pt awake for approx 30 total minutes ST with 4 episodes of short tactile cues (5-10 seconds) for wakefulness. SLP elicited voicing from pt with communicative vowels ("oooo", " uhhuh", "uhuh", "ohhhh") <10% in upright position, and approx 75% in imitation of same vowels in copmletely reclined position. Pt's voice took x3-4 attempts to phonate in fully reclined, and then sustained for x5-6 reps of imitation of vowels when pt upright before pt aphonic again. Feeling (with hand over hand assist) her thyroid cartilage for vocal fold vibrations in both positions again occurred. Pt noted with wet voice in fully reclined position throughout session. Pt answered personal "wh" questions and sentence fill-ins with children's/grandchildren's names with approx 80% accuracy. Close sentences were completed correctly 4/7, with no response x2.     Assessment / Recommendations / Plan   Plan Continue with current plan of care     Progression Toward Goals   Progression toward goals Progressing toward goals            SLP Short Term Goals - 11/19/15 1322  SLP SHORT TERM GOAL #1   Title Pt will demonstrate audible phonation with words, vowels, or syllables in 4/10 trials with max A   Status On-going     SLP SHORT TERM GOAL #2   Title Pt will demonstrate breath support with max A, prior to repetition of words 4/10   Status Achieved     SLP SHORT TERM GOAL #3   Title Pt will swallow ice chip/H20 via spoon in less than 7 seconds with usual min to mod A 3/5 trials   Status On-going     SLP SHORT TERM GOAL #4   Title Pt will sustain attention to therapy task for over 7 minutes with occasional min A   Status Achieved          SLP Long Term Goals - 11/19/15 1322      SLP LONG TERM GOAL #1    Title Pt will demonstrate audible phonation/intelligible speech with 2 word phrases with occasional min A 5/7 utterances   Time 1   Period Weeks   Status On-going     SLP LONG TERM GOAL #2   Title Pt will demonstrate audible cough/throat clear to command and to clear secretions with occasional min A 4/5 trials.    Time 1   Period Weeks   Status On-going     SLP LONG TERM GOAL #3   Title Pt will achieve timely oral phase and swallow with ice chip/ water trials 5/7 trials   Time 1   Period Weeks   Status On-going     SLP LONG TERM GOAL #4   Title Pt will sustain attention to therapy tasks for 12 minutes with occasional min A   Time 1   Period Weeks   Status On-going          Plan - 11/19/15 1617    Clinical Impression Statement Focus on speech/voicing today. Pt was able to remain alert for approx 30 minutes of 40 minute session, using tactile stimulation of short duration. Persistent significant deficits in speech and swallowing exist. Skilled SLP services cont to be warranted due to incr'd pt responsiveness and thus ability to work on ST goals. ST would be good to continue in order to decrease caregiver burden by maximizing pt speech/languge as well as swallowing function.   Speech Therapy Frequency 3x / week   Duration --  1 more visit   Treatment/Interventions Aspiration precaution training;Trials of upgraded texture/liquids;Compensatory strategies;Functional tasks;Patient/family education;Cueing hierarchy;Diet toleration management by SLP;Cognitive reorganization;Compensatory techniques;SLP instruction and feedback;Internal/external aids;Multimodal communcation approach   Potential to Achieve Goals Fair   Potential Considerations Ability to learn/carryover information;Co-morbidities;Severity of impairments      Patient will benefit from skilled therapeutic intervention in order to improve the following deficits and impairments:   Dysphagia, oropharyngeal phase  Dysarthria  and anarthria  Cognitive communication deficit    Problem List Patient Active Problem List   Diagnosis Date Noted  . Tracheostomy tube present ([facility])   . Tracheal stenosis   . Chronic respiratory failure with hypoxia ([facility])   . 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 Tracheostomy status ([facility]) 09/26/2015  . Allergic rhinitis 09/26/2015  . 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 Movement disorder 09/22/2015  . Encephalitis 09/22/2015  . Chest pain 02/07/2014  . Premature atrial contractions 01/13/2014  . Abnormality of gait 12/04/2013  . Hyperlipidemia 12/21/2011  . Cardiovascular risk factor 12/21/2011  . Bunion 01/31/2008  . Metatarsalgia of both feet 09/20/2007  . FLAT FOOT 09/20/2007  . HALLUX  RIGIDUS, ACQUIRED 09/20/2007    [facility] ,MS, CCC-SLP    11/19/2015, 4:20 PM  [facility] [address] [place], [place], [address] Phone: [phone]   Fax:  [phone]   Name: Judy Spears MRN: [id number] Date of Birth: 02-Dec-1950

## 2015-11-20 ENCOUNTER — Ambulatory Visit: Payer: BC Managed Care – PPO | Admitting: Physical Therapy

## 2015-11-20 ENCOUNTER — Ambulatory Visit: Payer: BC Managed Care – PPO

## 2015-11-20 ENCOUNTER — Ambulatory Visit: Payer: BC Managed Care – PPO | Admitting: Occupational Therapy

## 2015-11-20 ENCOUNTER — Encounter: Payer: Self-pay | Admitting: Occupational Therapy

## 2015-11-20 DIAGNOSIS — M25612 Stiffness of left shoulder, not elsewhere classified: Secondary | ICD-10-CM

## 2015-11-20 DIAGNOSIS — M6281 Muscle weakness (generalized): Secondary | ICD-10-CM

## 2015-11-20 DIAGNOSIS — R2681 Unsteadiness on feet: Secondary | ICD-10-CM

## 2015-11-20 DIAGNOSIS — R278 Other lack of coordination: Secondary | ICD-10-CM

## 2015-11-20 DIAGNOSIS — M25611 Stiffness of right shoulder, not elsewhere classified: Secondary | ICD-10-CM

## 2015-11-20 DIAGNOSIS — R471 Dysarthria and anarthria: Secondary | ICD-10-CM

## 2015-11-20 DIAGNOSIS — R293 Abnormal posture: Secondary | ICD-10-CM

## 2015-11-20 DIAGNOSIS — R41841 Cognitive communication deficit: Secondary | ICD-10-CM

## 2015-11-20 DIAGNOSIS — R41844 Frontal lobe and executive function deficit: Secondary | ICD-10-CM

## 2015-11-20 DIAGNOSIS — R1312 Dysphagia, oropharyngeal phase: Secondary | ICD-10-CM | POA: Diagnosis not present

## 2015-11-20 DIAGNOSIS — R2689 Other abnormalities of gait and mobility: Secondary | ICD-10-CM

## 2015-11-20 NOTE — Telephone Encounter (Signed)
Patient's husband returned Judy Spears's call, CB is [phone].

## 2015-11-20 NOTE — Therapy (Signed)
[facility] [address] [place], [place], [address] Phone: [phone]   Fax:  [phone]  Physical Therapy Treatment  Patient Details  Name: Judy Spears MRN: [id number] Date of Birth: 17-Jul-1950 Referring Provider: Alger Simons, MD  Encounter Date: 11/20/2015      PT End of Session - 11/20/15 1040    Visit Number 25   Number of Visits 45   Date for PT Re-Evaluation 01/18/16   Authorization Type BC[employer]   PT Start Time [phone]   PT Stop Time 1015   PT Time Calculation (min) 34 min   Activity Tolerance Patient tolerated treatment well   Behavior During Therapy [facility] for tasks assessed/performed      Past Medical History:  Diagnosis Date  . Arthritis    lt great toe  . Complication of anesthesia    pt has had headaches post op-did advise to hydra well preop  . Hair loss    right-sided  . History of exercise stress test    a. ETT (12/15) with 12:00 exercise, no ST changes, occasional PACs.   . Hyperlipidemia   . Insomnia   . Migraine headache   . 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 Postmenopausal   . Premature atrial contractions    a. Holter (12/15) with 8% PACs, no atrial runs or atrial fibrillation.     Past Surgical History:  Procedure Laterality Date  . BREAST BIOPSY  01/01/2011   Procedure: BREAST BIOPSY WITH NEEDLE LOCALIZATION;  Surgeon: Rolm Bookbinder, MD;  Location: [facility];  Service: General;  Laterality: Left;  left breast wire localization biopsy  . cataracts right eye    . CESAREAN SECTION  1986  . HERNIA REPAIR  2000   RIH  . opirectinal membrane peel    . RHINOPLASTY  1983    There were no vitals filed for this visit.      Subjective Assessment - 11/20/15 1034    Subjective Caregiver, [name], reports pt late for this session due to needing to use bathroom at last minute prior to leaving home.   Patient is accompained by: --  caregiver, [name]   Pertinent  History Precautions:  PEG, seizure. 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 No/denies     Donned B custom AFO's (recently modified to remove ankle articulation to control closed-chain ankle DF, crouched posture)  NMR: Performed blocked practice of sit <> stand from personal tilt-in-space w/c with BUE support at // bars, no anterior tibial block/fulcrum with max-total A, +2A required (at times) to stabilize LLE on floor. Although AFO's controlling knees (less excessive B knee flexion), pt with excessive B hip flexion in standing. Therefore, final 3 sit <> stand tranfers, static standing trials x30-45 seconds were performed with SPT providing mod A for stability/balance, PT (anterior to pt) utilizing sheet (posterior to B hips) to pull pt into hip extension in standing.                         PT Education - 11/20/15 1035    Education provided Yes   Education Details Explained mechanism of brace modification, with emphasis on use  of posterior strap to limit dorsiflexion in attempt to limit crouched posture.    Person(s) Educated Theatre stage manager);Patient   Methods Explanation;Demonstration   Comprehension Verbalized understanding          PT Short Term Goals - 11/19/15 2029      PT SHORT TERM GOAL #1   Title Pt will consistently perform supine rolling to in B directions with supervision, 25% cueing to indicate increased trunk dissociation, increased independence with bed mobility.  (Target date:12/17/15)    Time 4   Period Weeks   Status New     PT SHORT TERM GOAL #2   Title Pt will consistently perform supine <> sit with min A, 25% cueing to indicate increased independence getting out  of bed. (12/17/15)   Time 4   Period Weeks   Status New     PT SHORT TERM GOAL #3   Title Pt will demonstrate midline posture in static sitting with BLE support with 50% verbal cueing to indicate increased postural awareness/control.  (12/17/15)   Time 4   Period Weeks   Status New     PT SHORT TERM GOAL #4   Title Pt will consistently perform level transfers from w/c <> mat table with mod A, 50% cueing to decrease caregiver burden.  (12/17/15)   Time 4   Period Weeks   Status New     PT SHORT TERM GOAL #5   Title Pt will consistently perform sit <> stand from personal w/c with BUE support and mod A, 50% cueing to decrease caregiver burden.  (12/17/15)   Time 4   Period Weeks   Status New     Additional Short Term Goals   Additional Short Term Goals Yes     PT SHORT TERM GOAL #6   Title Pt will perform static standing with BUE support x1 minute with supervision, no overt LOB to decrease caregiver burden.  (12/17/15)   Time 4   Period Weeks   Status New     PT SHORT TERM GOAL #7   Title PT will assess current wheelchair and determine if alternative seating system would better maximize pt safety with functional mobility.  (12/17/15)   Time 4   Period Weeks   Status New           PT Long Term Goals - 11/19/15 2034      PT LONG TERM GOAL #1   Title Pt will consistently perform supine <> sit with supervision, 25% cueing to indicate increased independence getting out of bed.   (Target: 01/14/16)   Time 8   Period Weeks   Status New     PT LONG TERM GOAL #2   Title Pt will demonstrate midline posture in static sitting with BLE support with subtle to min cueing to indicate increased postural awareness/control. (01/14/16)   Time 8   Period Weeks   Status New     PT LONG TERM GOAL #3   Title Pt will consistently perform level transfers from w/c <> mat table with min A, 25% cueing to decrease caregiver burden.  (01/14/16)   Time 8   Period Weeks   Status New     PT  LONG TERM GOAL #4   Title Pt will consistently perform sit <> stand from personal w/c with BUE support and min A, 25% cueing to decrease caregiver burden.  (01/14/16)   Time 8   Period Weeks   Status New     PT LONG TERM  GOAL #5   Title PT will assess gait to determine if ambulation is safe/functional.  (01/14/16)   Time 8   Period Weeks   Status New               Plan - 11/20/15 1041    Clinical Impression Statement Current custom AFO's recently modified by orthotist to limit B ankle dorsiflexion and therefore limit crouched standing posture. Current session focused on trialing standing with use of modified AFO's. While standing with BUE support at // bars (no anterior tibial block), pt did demo decreased B knee flexion and increased anterior weight shift in static standing, but also exhibited excessive B hip flexion in standing. Expect this will improve with increased practice. Pt may benefit from using modified AFO's in standing frame to enable pt adjust to change in knee position and weight shift.    Rehab Potential Fair   Clinical Impairments Affecting Rehab Potential Positive: strong family/social support; negative: chronicity of condition   PT Frequency 3x / week  followed by 2x/week for 4 weeks   PT Duration 4 weeks  followed by 2x/week for 4 weeks   PT Treatment/Interventions ADLs/Self Care Home Management;DME Instruction;Gait training;Stair training;Functional mobility training;Therapeutic activities;Patient/family education;Cognitive remediation;Neuromuscular re-education;Therapeutic exercise;Balance training;Orthotic Fit/Training;Wheelchair mobility training;Manual techniques;Splinting;Visual/perceptual remediation/compensation;Vestibular;Passive range of motion   PT Next Visit Plan Trial modified AFO's in standing frame (consider lowering standing frame sling slightly and allowing pt to actively perform final 10-25% of B hip extension) to get pt used to AFO's.   Consulted  and Agree with Plan of Care Patient;Family member/caregiver   Family Member Consulted caregiver, [name]      Patient will benefit from skilled therapeutic intervention in order to improve the following deficits and impairments:  Abnormal gait, Decreased balance, Decreased endurance, Decreased cognition, Cardiopulmonary status limiting activity, Decreased activity tolerance, Decreased coordination, Decreased strength, Impaired flexibility, Impaired tone, Decreased mobility, Decreased skin integrity, Increased muscle spasms, Impaired vision/preception, Decreased range of motion, Impaired UE functional use, Postural dysfunction, Pain  Visit Diagnosis: Other abnormalities of gait and mobility  Other lack of coordination  Abnormal posture  Muscle weakness (generalized)  Unsteadiness on feet     Problem List Patient Active Problem List   Diagnosis Date Noted  . Tracheostomy tube present ([place])   . Tracheal stenosis   . Chronic respiratory failure with hypoxia ([place])   . 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[place]) 10/20/2015  . Tracheostomy status ([place]) 09/26/2015  . Allergic rhinitis 09/26/2015  . 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 Movement disorder 09/22/2015  . Encephalitis 09/22/2015  . Chest pain 02/07/2014  . Premature atrial contractions 01/13/2014  . Abnormality of gait 12/04/2013  . Hyperlipidemia 12/21/2011  . Cardiovascular risk factor 12/21/2011  . Bunion 01/31/2008  . Metatarsalgia of both feet 09/20/2007  . FLAT FOOT 09/20/2007  . HALLUX RIGIDUS, ACQUIRED 09/20/2007   Billie Ruddy, PT, [facility] [address] [place], [place], [address] Phone: [phone]   Fax:  [phone] 11/20/15, 10:49 AM  Name: CERIYAH NEARING MRN: [id number] Date of Birth: [birth year]

## 2015-11-20 NOTE — Telephone Encounter (Signed)
lmtcb for pt's spouse  

## 2015-11-20 NOTE — Therapy (Signed)
[facility] [address] [place], [place], [address] Phone: [phone]   Fax:  [phone]  Speech Language Pathology Treatment  Patient Details  Name: Judy Spears MRN: [id number] Date of Birth: 07/11/1950 Referring Provider: Dr. Alger Simons  Encounter Date: 11/20/2015      End of Session - 11/20/15 1342    Visit Number 24   Number of Visits 36   Date for SLP Re-Evaluation 01/02/16   SLP Start Time 1018   SLP Stop Time  1056   SLP Time Calculation (min) 38 min   Activity Tolerance Patient limited by lethargy      Past Medical History:  Diagnosis Date  . Arthritis    lt great toe  . Complication of anesthesia    pt has had headaches post op-did advise to hydra well preop  . Hair loss    right-sided  . History of exercise stress test    a. ETT (12/15) with 12:00 exercise, no ST changes, occasional PACs.   . Hyperlipidemia   . Insomnia   . Migraine headache   . 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 Postmenopausal   . Premature atrial contractions    a. Holter (12/15) with 8% PACs, no atrial runs or atrial fibrillation.     Past Surgical History:  Procedure Laterality Date  . BREAST BIOPSY  01/01/2011   Procedure: BREAST BIOPSY WITH NEEDLE LOCALIZATION;  Surgeon: Rolm Bookbinder, MD;  Location: [facility];  Service: General;  Laterality: Left;  left breast wire localization biopsy  . cataracts right eye    . CESAREAN SECTION  1986  . HERNIA REPAIR  2000   RIH  . opirectinal membrane peel    . RHINOPLASTY  1983    There were no vitals filed for this visit.      Subjective Assessment - 11/20/15 1328    Subjective Pt smiled, after SLP "Hi [name]!"   Patient is accompained by: [name], caregiver               ADULT SLP TREATMENT - 11/20/15 1328      General Information   Behavior/Cognition Alert;Cooperative;Pleasant mood;Other (comment)  more somnolent than  Wednesday session     Treatment Provided   Treatment provided Cognitive-Linquistic     Pain Assessment   Pain Assessment --  no overt indications of pain     Cognitive-Linquistic Treatment   Treatment focused on Voice;Dysarthria;Cognition   Skilled Treatment Pt awake for approx 25-30 total minutes ST with 7 episodes of short tactile cues of approx 10 seconds for re-arousal. SLP elicited voicing from pt with communicative vowels ("oooo", " uhhuh", "uhuh", "ohhhh") approx 10% of the time with pt in upright position, and approx 65% in imitation of same vowels in completely reclined position. Pt's voice took x5 attempts to phonate in fully reclined position (in wheelchair), and sustained weaker voicing for x6-7 reps of imitation of those same vowels when pt upright before pt aphonic again. SLP again had pt feeling (with hand over hand assist) her thyroid cartilage for vocal fold vibrations in both positions. SLP demo'd overarticulation to patient to incr. intelligibility when aphonic. Pt spontaneously with some small degree of overaricultion for 30 seconds after explanation/encouragement to overarticulate ("open your mouth more when you talk"). With nonverbal cues to overarticulate, pt did not respond, however with verbal and visual cues to do so, pt responded with notable difference in articulatory effort 30%  of the time, increasing intelligibility from 10% to 25%. SLP assessed pt's feasibility for respiratory muscle training, and pt did not/could not inhale with any effort whatsoever, however blew with notable exertion, albeit weak -fatigue suspected after 3 reps.     Assessment / Recommendations / Plan   Plan Continue with current plan of care     Progression Toward Goals   Progression toward goals Progressing toward goals            SLP Short Term Goals - 11/19/15 1322      SLP SHORT TERM GOAL #1   Title Pt will demonstrate audible phonation with words, vowels, or syllables in 4/10 trials  with max A   Status On-going     SLP SHORT TERM GOAL #2   Title Pt will demonstrate breath support with max A, prior to repetition of words 4/10   Status Achieved     SLP SHORT TERM GOAL #3   Title Pt will swallow ice chip/H20 via spoon in less than 7 seconds with usual min to mod A 3/5 trials   Status On-going     SLP SHORT TERM GOAL #4   Title Pt will sustain attention to therapy task for over 7 minutes with occasional min A   Status Achieved          SLP Long Term Goals - 11/20/15 1355      SLP LONG TERM GOAL #1   Title Pt will imitate one to two syllables with voicing in upright position 50% of the time.   Time 4   Period Weeks   Status New     SLP LONG TERM GOAL #2   Title pt will demonstrate adequate breath support for speech 25% of the time with imitative responses and max cues usually (50-80%)   Time 4   Period Weeks   Status New     SLP LONG TERM GOAL #3   Title after oral care, pt will achieve initiation of oral phase of the swallow with ice chip/water trials with average <6 seconds over 4 sessions   Time 4   Period Weeks   Status New          Plan - 11/20/15 1343    Clinical Impression Statement Focus on speech/voicing today. Pt is now more alert during sessions, and today was able to remain alert for approx 30 minutes of 40 minute session, using consistent tactile stimulation of short duration. Persistent significant deficits in speech and swallowing exist. Skilled SLP services cont to be warranted due to incr'd pt responsiveness and thus ability to work on ST goals. SLP is considering pt initiate work with respiratory muscle trainer/s, and assumes pt would need time to habitualize labial seal and other more automatic muscular movements necessary to complete adequately/effectively. ST would be good to continue x3/week for at least 4 weeks to work on basic speech tasks and pre-swallowing tasks, in order to decrease caregiver burden by maximizing pt  speech/languge as well as swallowing function.   Speech Therapy Frequency 3x / week   Duration 4 weeks  (or 36 total sessions)   Treatment/Interventions Aspiration precaution training;Trials of upgraded texture/liquids;Compensatory strategies;Functional tasks;Patient/family education;Cueing hierarchy;Diet toleration management by SLP;Cognitive reorganization;Compensatory techniques;SLP instruction and feedback;Internal/external aids;Multimodal communcation approach   Potential to Achieve Goals Fair   Potential Considerations Ability to learn/carryover information;Co-morbidities;Severity of impairments      Patient will benefit from skilled therapeutic intervention in order to improve the following deficits and impairments:  Dysphagia, oropharyngeal phase  Dysarthria and anarthria  Cognitive communication deficit    Problem List Patient Active Problem List   Diagnosis Date Noted  . Tracheostomy tube present ([facility])   . Tracheal stenosis   . Chronic respiratory failure with hypoxia ([place])   . 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[place]) 10/20/2015  . Tracheostomy status ([place]) 09/26/2015  . Allergic rhinitis 09/26/2015  . 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 Movement disorder 09/22/2015  . Encephalitis 09/22/2015  . Chest pain 02/07/2014  . Premature atrial contractions 01/13/2014  . Abnormality of gait 12/04/2013  . Hyperlipidemia 12/21/2011  . Cardiovascular risk factor 12/21/2011  . Bunion 01/31/2008  . Metatarsalgia of both feet 09/20/2007  . FLAT FOOT 09/20/2007  . HALLUX RIGIDUS, ACQUIRED 09/20/2007    [facility] ,[place], CCC-SLP  11/20/2015, 2:04 PM  [facility] [address] [place], [place], [address] Phone: [phone]   Fax:  [phone]   Name: Judy Spears MRN: [id number] Date of Birth: Jan 22, 1951

## 2015-11-20 NOTE — Therapy (Signed)
[facility] [address] [place], [place], [address] Phone: [phone]   Fax:  [phone]  Occupational Therapy Treatment  Patient Details  Name: Judy Spears MRN: [id number] Date of Birth: 1950-04-30 Referring Provider: Dr. Sharion Dove  Encounter Date: 11/20/2015      OT End of Session - 11/20/15 1349    Visit Number 24   Number of Visits [place] unlimited visits   OT Start Time 1100   OT Stop Time 1145   OT Time Calculation (min) 45 min   Activity Tolerance Patient tolerated treatment well   Behavior During Therapy [facility] for tasks assessed/performed      Past Medical History:  Diagnosis Date  . Arthritis    lt great toe  . Complication of anesthesia    pt has had headaches post op-did advise to hydra well preop  . Hair loss    right-sided  . History of exercise stress test    a. ETT (12/15) with 12:00 exercise, no ST changes, occasional PACs.   . Hyperlipidemia   . Insomnia   . Migraine headache   . 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 Postmenopausal   . Premature atrial contractions    a. Holter (12/15) with 8% PACs, no atrial runs or atrial fibrillation.     Past Surgical History:  Procedure Laterality Date  . BREAST BIOPSY  01/01/2011   Procedure: BREAST BIOPSY WITH NEEDLE LOCALIZATION;  Surgeon: Rolm Bookbinder, MD;  Location: [facility];  Service: General;  Laterality: Left;  left breast wire localization biopsy  . cataracts right eye    . CESAREAN SECTION  1986  . HERNIA REPAIR  2000   RIH  . opirectinal membrane peel    . RHINOPLASTY  1983    There were no vitals filed for this visit.      Subjective Assessment - 11/20/15 1342    Subjective  Patient alert and quietly voicing "hi" as greeting   Pertinent History see epic snapshot - ABI/hypoxia from tic bite/ virus   Patient Stated Goals Pt unable to state.    Currently in Pain? No/denies    Pain Score 0-No pain                      OT Treatments/Exercises (OP) - 11/20/15 0001      ADLs   Toileting Discussed sit to stand transfer assist with aptient and caregiver, will work with patient and caregiver to assure effectiveness of this device prior to any purchase.       Neurological Re-education Exercises   Other Exercises 1 Neuromuscular reeducation to  address sit to stand and standing  with emphasis on LE activation,  and weight shift forward.  Orthotist has made adjustments to current AFO to lock out hinge mechanism at ankle to prevent excessive dorsiflexion.  Initially, patient had difficulty lifting off for standing, but with repeated practice able to utilize legs earlier in sit to stand transition, and able to stand with increased hip and knee extension.  Worked on actively reaching upward to promote trunk and hip extension, and to promote active weight shifting onto aligned left lower extremity.                   OT Education - 11/19/15 1251    Education provided Yes   Education Details Reinforced potentail benefits of sit to stand lift for functional  transfers at home   Person(s) Educated Patient;Caregiver(s)   Methods Explanation   Comprehension Verbalized understanding          OT Short Term Goals - 11/19/15 1257      OT SHORT TERM GOAL #1   Title Pt and husband will be mod I with HEP - 10/21/2015   Status On-going     OT SHORT TERM GOAL #2   Title Pt will demonstrate adequate postural control for breath support to voice at least 3 words   Status Achieved     OT SHORT TERM GOAL #3   Title Pt will transfer to commode with moderate assistance 25% of the time.   Status On-going     OT SHORT TERM GOAL #4   Title Pt will tolerate positional changes as evidenced by respiration rate and motor quieting in preparation for funcitonal mobility   Status Achieved           OT Long Term Goals - 11/19/15 1258      OT LONG TERM GOAL #1    Title Pt and husband will be mod I with upgraded HEP - 11/18/2015   Status On-going     OT LONG TERM GOAL #2   Title Pt will demonstrate adequate postural control to support 3 words in supported sitting.   Status On-going     OT LONG TERM GOAL #3   Title Pt will demonstrate ability for low reach with RUE for functional task with  mod facilitation   Status Achieved     OT LONG TERM GOAL #4   Title Pt will wash face with LUE with min a in supported sitting   Status On-going     OT LONG TERM GOAL #5   Title Pt will release objects with L hand with min faciltation    Status On-going     OT LONG TERM GOAL #6   Title Pt will stand with min a while caregiver manages pants during LB dressing 75% of the time.   Status On-going     OT LONG TERM GOAL #7   Title Pt will transfer to commode with mod a 50% of the time.   Status On-going     OT LONG TERM GOAL #8   Title Pt will be able to sit unsupported with supervision 50% of the time while engaged in functional task.   Status On-going               Plan - 11/20/15 1350    Clinical Impression Statement Patient with adjustments to current AFO which seem to translate into improved ability to transition into stand position as needed for basic ADL.    Rehab Potential Fair   OT Frequency 3x / week   OT Duration 8 weeks   OT Treatment/Interventions Self-care/ADL training;Ultrasound;Moist Heat;Electrical Stimulation;DME and/or AE instruction;Neuromuscular education;Therapeutic exercise;Functional Mobility Training;Manual Therapy;Passive range of motion;Splinting;Therapeutic activities;Balance training;Patient/family education;Visual/perceptual remediation/compensation;Cognitive remediation/compensation   Plan LE activation, sit to stand with grab bar, partial stand to stand with grab bar, trunk extension in standing, low to mid reach   Consulted and Agree with Plan of Care Patient;Family member/caregiver   Family Member Consulted  caregiver [name]      Patient will benefit from skilled therapeutic intervention in order to improve the following deficits and impairments:  Decreased endurance, Decreased skin integrity, Impaired vision/preception, Improper body mechanics, Impaired perceived functional ability, Decreased knowledge of precautions, Cardiopulmonary status limiting activity, Decreased activity tolerance, Decreased knowledge of use of DME,  Decreased strength, Impaired flexibility, Improper spinal/pelvic alignment, Impaired sensation, Difficulty walking, Decreased mobility, Decreased balance, Decreased cognition, Decreased range of motion, Impaired tone, Pain, Impaired UE functional use, Decreased safety awareness, Decreased coordination  Visit Diagnosis: Other lack of coordination  Abnormal posture  Muscle weakness (generalized)  Unsteadiness on feet  Stiffness of right shoulder, not elsewhere classified  Stiffness of left shoulder, not elsewhere classified  Frontal lobe and executive function deficit    Problem List Patient Active Problem List   Diagnosis Date Noted  . Tracheostomy tube present ([place])   . Tracheal stenosis   . Chronic respiratory failure with hypoxia ([place])   . 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 Tracheostomy status ([place]) 09/26/2015  . Allergic rhinitis 09/26/2015  . 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 Movement disorder 09/22/2015  . Encephalitis 09/22/2015  . Chest pain 02/07/2014  . Premature atrial contractions 01/13/2014  . Abnormality of gait 12/04/2013  . Hyperlipidemia 12/21/2011  . Cardiovascular risk factor 12/21/2011  . Bunion 01/31/2008  . Metatarsalgia of both feet 09/20/2007  . FLAT FOOT 09/20/2007  . HALLUX RIGIDUS, ACQUIRED 09/20/2007    Mariah Milling 11/20/2015, 1:53 PM  [facility] [address] [place], [place], [address] Phone: [phone]   Fax:  [phone]  Name: Judy Spears MRN:  [id number] Date of Birth: August 11, 1950

## 2015-11-21 NOTE — Telephone Encounter (Signed)
Called and lmom to make Judy Spears aware of BQ recs. Nothing further is needed.

## 2015-11-21 NOTE — Telephone Encounter (Signed)
It sounds like an appropriate healing process. If trouble coughing out secretions or dyspnea they need to let us know.

## 2015-11-21 NOTE — Telephone Encounter (Signed)
Spoke with pt's spouse, states that he had received a message from [facility] checking on the status of pt's healing trach.  Pt's spouse states that the area is not completely healed but is much smaller than it was a few days ago.  It is still producing secretions- he's keeping a bandaid on the area, and when the bandaid is removed he is removing secretions also, but no discolored or foul odor noted in secretions.  Area is not red or painful.     I advised pt's spouse that I'd forward this info to both [place] and BQ to keep updated, and advised him to contact the trach clinic if anything worsens in her healing process.  Pt's spouse expressed understanding.  Forwarding to [facility] and [facility] as FYI.

## 2015-11-24 ENCOUNTER — Ambulatory Visit: Payer: BC Managed Care – PPO | Admitting: Occupational Therapy

## 2015-11-24 ENCOUNTER — Encounter: Payer: Self-pay | Admitting: Occupational Therapy

## 2015-11-24 ENCOUNTER — Ambulatory Visit: Payer: BC Managed Care – PPO | Admitting: *Deleted

## 2015-11-24 ENCOUNTER — Ambulatory Visit: Payer: BC Managed Care – PPO | Admitting: Physical Therapy

## 2015-11-24 DIAGNOSIS — R1312 Dysphagia, oropharyngeal phase: Secondary | ICD-10-CM

## 2015-11-24 DIAGNOSIS — R2689 Other abnormalities of gait and mobility: Secondary | ICD-10-CM

## 2015-11-24 DIAGNOSIS — R2681 Unsteadiness on feet: Secondary | ICD-10-CM

## 2015-11-24 DIAGNOSIS — M25612 Stiffness of left shoulder, not elsewhere classified: Secondary | ICD-10-CM

## 2015-11-24 DIAGNOSIS — R293 Abnormal posture: Secondary | ICD-10-CM

## 2015-11-24 DIAGNOSIS — M25652 Stiffness of left hip, not elsewhere classified: Secondary | ICD-10-CM

## 2015-11-24 DIAGNOSIS — R41844 Frontal lobe and executive function deficit: Secondary | ICD-10-CM

## 2015-11-24 DIAGNOSIS — R471 Dysarthria and anarthria: Secondary | ICD-10-CM

## 2015-11-24 DIAGNOSIS — M25611 Stiffness of right shoulder, not elsewhere classified: Secondary | ICD-10-CM

## 2015-11-24 DIAGNOSIS — M6281 Muscle weakness (generalized): Secondary | ICD-10-CM

## 2015-11-24 DIAGNOSIS — R278 Other lack of coordination: Secondary | ICD-10-CM

## 2015-11-24 DIAGNOSIS — R41841 Cognitive communication deficit: Secondary | ICD-10-CM

## 2015-11-24 NOTE — Therapy (Signed)
[facility] [address] [place], [place], [address] Phone: [phone]   Fax:  [phone]  Occupational Therapy Treatment  Patient Details  Name: Judy Spears MRN: [id number] Date of Birth: [birth year] Referring Provider: Dr. Sharion Dove  Encounter Date: 11/24/2015      OT End of Session - 11/24/15 1257    Visit Number 25   Number of Visits 4   Date for OT Re-Evaluation 01/25/16   Authorization Type [employer] unlimited visits   OT Start Time 1100   OT Stop Time 1144   OT Time Calculation (min) 44 min   Activity Tolerance Patient tolerated treatment well      Past Medical History:  Diagnosis Date  . Arthritis    lt great toe  . Complication of anesthesia    pt has had headaches post op-did advise to hydra well preop  . Hair loss    right-sided  . History of exercise stress test    a. ETT (12/15) with 12:00 exercise, no ST changes, occasional PACs.   . Hyperlipidemia   . Insomnia   . Migraine headache   . 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 Postmenopausal   . Premature atrial contractions    a. Holter (12/15) with 8% PACs, no atrial runs or atrial fibrillation.     Past Surgical History:  Procedure Laterality Date  . BREAST BIOPSY  01/01/2011   Procedure: BREAST BIOPSY WITH NEEDLE LOCALIZATION;  Surgeon: Rolm Bookbinder, MD;  Location: [facility];  Service: General;  Laterality: Left;  left breast wire localization biopsy  . cataracts right eye    . CESAREAN SECTION  1986  . HERNIA REPAIR  2000   RIH  . opirectinal membrane peel    . RHINOPLASTY  1983    There were no vitals filed for this visit.      Subjective Assessment - 11/24/15 1240    Subjective  "Wow" after discussing some strategies for making shower easier   Patient is accompained by: --  caregiver [name]   Pertinent History see epic snapshot - ABI/hypoxia from tic bite/ virus   Patient Stated Goals Pt unable  to state.    Currently in Pain? No/denies                      OT Treatments/Exercises (OP) - 11/24/15 0001      ADLs   Bathing Practiced simulated bathing - pt now able to maintain sitting balance with cues and close supervision. Caregiver reports that at home during shower pt goes into extension and slides near edge of tub bench.  Issued pt 2 d ring straps and piece of velcrow and demonstrated how to attach to tub bench for soft seat belt.  Also discussed placing feet on stool in shower as pt currently cannot touch with feet - discussed stool that would bring knees slightly higher then hips to assist in keeping pt in more flexion.  Pt then able to wash face with min a, lower arms, thighs and even knees to mid shin. Discussed benefits of encouraging pt to engage in this activity both in shower and in wheelchair at sink (balance, functional use of BUE"s, cognitive engagement into activity).  Caregiver verbalized understanding.    Functional Mobility Practiced sliding board transers to level surfaces.  Pt's caregiver [name] expressed concern that some caregivers transferring pt were torquing L ankle during transfer. Also concern for long term  burden of care for caregivers in lifting pt repetitively.  [name] able to complete with min a and mod vc's. Caregiver will need practice. Discussed using this transfer for bed and couch transfers as there is not room to pull wheelchair up next to shower bench in bathroom.  Will continue to practice as this eliminates dificullt pivot for pt and caregiver.  DIscussed other activities that would allow pt to work on sit to stand.                    OT Short Term Goals - 11/24/15 1249      OT SHORT TERM GOAL #1   Title Pt and husband will be mod I with HEP - 12/18/2015   Status On-going     OT SHORT TERM GOAL #2   Title Pt will demonstrate adequate postural control for breath support to voice at least 2 words   Status Achieved     OT SHORT  TERM GOAL #3   Title Pt will transfer to commode with moderate assistance 25% of the time.   Status On-going     OT SHORT TERM GOAL #4   Title Pt will tolerate positional changes as evidenced by respiration rate and motor quieting in preparation for funcitonal mobility   Status Achieved           OT Long Term Goals - 11/24/15 1250      OT LONG TERM GOAL #1   Title Pt and husband will be mod I with upgraded HEP - 01/15/2016   Status On-going     OT LONG TERM GOAL #2   Title Pt will demonstrate adequate postural control to support 3 words in supported sitting.   Status On-going     OT LONG TERM GOAL #3   Title Pt will demonstrate ability for low reach with RUE for functional task with  mod facilitation   Status Achieved     OT LONG TERM GOAL #4   Title Pt will wash face with LUE with min a in supported sitting   Status Achieved     OT LONG TERM GOAL #5   Title Pt will release objects with L hand with min faciltation    Status Achieved     OT LONG TERM GOAL #6   Title Pt will stand with min a while caregiver manages pants during LB dressing 75% of the time.   Status On-going     OT LONG TERM GOAL #7   Title Pt will transfer to commode with mod a 50% of the time.   Status On-going     OT LONG TERM GOAL #8   Title Pt will be able to sit unsupported with supervision 50% of the time while engaged in functional task.   Status On-going               Plan - 11/24/15 1251    Clinical Impression Statement Pt with slow progress toward goals. Pt with improved alertness overall and improving sitting balance.    Rehab Potential Fair   Clinical Impairments Affecting Rehab Potential severity of deficits, slow rate of recovery   OT Frequency Other (comment)  3x/wk x4 weeks then 2x/wk x4 weeks   OT Duration 8 weeks   OT Treatment/Interventions Self-care/ADL training;Ultrasound;Moist Heat;Electrical Stimulation;DME and/or AE instruction;Neuromuscular education;Therapeutic  exercise;Functional Mobility Training;Manual Therapy;Passive range of motion;Splinting;Therapeutic activities;Balance training;Patient/family education;Visual/perceptual remediation/compensation;Cognitive remediation/compensation   Plan check success of seat belt on tub bench, pt engaging in self  care. sitting balance, sit to stand, standing, functional use of BUE's,    Consulted and Agree with Plan of Care Patient;Family member/caregiver   Family Member Consulted caregiver [name]      Patient will benefit from skilled therapeutic intervention in order to improve the following deficits and impairments:  Decreased endurance, Decreased skin integrity, Impaired vision/preception, Improper body mechanics, Impaired perceived functional ability, Decreased knowledge of precautions, Cardiopulmonary status limiting activity, Decreased activity tolerance, Decreased knowledge of use of DME, Decreased strength, Impaired flexibility, Improper spinal/pelvic alignment, Impaired sensation, Difficulty walking, Decreased mobility, Decreased balance, Decreased cognition, Decreased range of motion, Impaired tone, Pain, Impaired UE functional use, Decreased safety awareness, Decreased coordination  Visit Diagnosis: Other lack of coordination - Plan: Ot plan of care cert/re-cert  Abnormal posture - Plan: Ot plan of care cert/re-cert  Muscle weakness (generalized) - Plan: Ot plan of care cert/re-cert  Unsteadiness on feet - Plan: Ot plan of care cert/re-cert  Stiffness of left shoulder, not elsewhere classified - Plan: Ot plan of care cert/re-cert  Stiffness of right shoulder, not elsewhere classified - Plan: Ot plan of care cert/re-cert  Frontal lobe and executive function deficit - Plan: Ot plan of care cert/re-cert    Problem List Patient Active Problem List   Diagnosis Date Noted  . Tracheostomy tube present ([place])   . Tracheal stenosis   . Chronic respiratory failure with hypoxia ([place])   . Spastic  tetraplegia ([place]) 10/20/2015  . Tracheostomy status ([place]) 09/26/2015  . Allergic rhinitis 09/26/2015  . 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 Movement disorder 09/22/2015  . Encephalitis 09/22/2015  . Chest pain 02/07/2014  . Premature atrial contractions 01/13/2014  . Abnormality of gait 12/04/2013  . Hyperlipidemia 12/21/2011  . Cardiovascular risk factor 12/21/2011  . Bunion 01/31/2008  . Metatarsalgia of both feet 09/20/2007  . FLAT FOOT 09/20/2007  . HALLUX RIGIDUS, ACQUIRED 09/20/2007    Quay Burow, OTR/L 11/24/2015, 1:01 PM  [facility] [address] [place], [place], [address] Phone: [phone]   Fax:  [phone]  Name: Judy Spears MRN: [id number] Date of Birth: [birth year]

## 2015-11-24 NOTE — Telephone Encounter (Signed)
Please make sure she has an appointment with either myself or Pete in the next 4 weeks to look at the trach site.

## 2015-11-24 NOTE — Therapy (Signed)
[facility] [address] [place], [place], [address] Phone: [phone]   Fax:  [phone]  Physical Therapy Treatment  Patient Details  Name: Judy Spears MRN: [id number] Date of Birth: 03/23/1950 Referring Provider: Alger Simons, MD  Encounter Date: 11/24/2015      PT End of Session - 11/24/15 1808    Visit Number 26   Number of Visits 45   Date for PT Re-Evaluation 01/18/16   Authorization Type BCBS   PT Start Time 1020   PT Stop Time 1100   PT Time Calculation (min) 40 min   Equipment Utilized During Treatment Other (comment)  standing frame   Activity Tolerance Patient tolerated treatment well   Behavior During Therapy [facility] for tasks assessed/performed      Past Medical History:  Diagnosis Date  . Arthritis    lt great toe  . Complication of anesthesia    pt has had headaches post op-did advise to hydra well preop  . Hair loss    right-sided  . History of exercise stress test    a. ETT (12/15) with 12:00 exercise, no ST changes, occasional PACs.   . Hyperlipidemia   . Insomnia   . Migraine headache   . 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 Postmenopausal   . Premature atrial contractions    a. Holter (12/15) with 8% PACs, no atrial runs or atrial fibrillation.     Past Surgical History:  Procedure Laterality Date  . BREAST BIOPSY  01/01/2011   Procedure: BREAST BIOPSY WITH NEEDLE LOCALIZATION;  Surgeon: Rolm Bookbinder, MD;  Location: [place];  Service: General;  Laterality: Left;  left breast wire localization biopsy  . cataracts right eye    . CESAREAN SECTION  1986  . HERNIA REPAIR  2000   RIH  . opirectinal membrane peel    . RHINOPLASTY  1983    There were no vitals filed for this visit.      Subjective Assessment - 11/24/15 1805    Subjective Caregiver reports eventful weekend, as pt attended grandson's 4th birthday party.    Patient is accompained  by: Family member  caregiver, [name]; husband present for initial 15 minutes   Pertinent History Precautions:  PEG, seizure. 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 No/denies  No pain at rest; overt signs of discomfort with increased L hip extension, attempted hip mobilization      Donned B (modified) custom articulating AFO's.  NMR: Stand with total A of standing frame x16 consecutive minutes for spasticity management, to increase L hip extensibility to addressed crouched standing/gait pattern, and to promote LE activation for decreased caregiver burden. While in standing frame, PT elevated sling every 3-4 minutes, as tolerated by patient. Attempted to perform P/A of L femur for increased hip extension ROM; however, pt unable to tolerate. Lowered standing frame sling to approx. 30-45 degrees from hip/knee full extension (for patient, full B hip extension is approx. -15 degrees), from which pt performed supported squat to standing with husband providing BUE support, therapist providing tactile cueing at B ribcage for upright posture. Standing trial ended due to overt signs of pt discomfort. Upon further questioning,  pt reports pain "in the back of my (left) hip," per pt. Therefore, transitioned to lateral scooting transfer from personal tilt-in-space w/c > mat table with total A; total A for slideboard placement/management. Pt left seated EOM with OT present for next session.                            PT Education - 11/24/15 1806    Education provided Yes   Education Details Recommended pt begin attempting semi reclined (progressing to supine) position at home for increased hip flexor  extensibility. Explained to caregiver and husband the importance of maintaining functional muscle length.   Person(s) Educated Patient;Spouse;Caregiver(s)   Methods Explanation   Comprehension Verbalized understanding          PT Short Term Goals - 11/19/15 2029      PT SHORT TERM GOAL #1   Title Pt will consistently perform supine rolling to in B directions with supervision, 25% cueing to indicate increased trunk dissociation, increased independence with bed mobility.  (Target date:12/17/15)    Time 4   Period Weeks   Status New     PT SHORT TERM GOAL #2   Title Pt will consistently perform supine <> sit with min A, 25% cueing to indicate increased independence getting out of bed. (12/17/15)   Time 4   Period Weeks   Status New     PT SHORT TERM GOAL #3   Title Pt will demonstrate midline posture in static sitting with BLE support with 50% verbal cueing to indicate increased postural awareness/control.  (12/17/15)   Time 4   Period Weeks   Status New     PT SHORT TERM GOAL #4   Title Pt will consistently perform level transfers from w/c <> mat table with mod A, 50% cueing to decrease caregiver burden.  (12/17/15)   Time 4   Period Weeks   Status New     PT SHORT TERM GOAL #5   Title Pt will consistently perform sit <> stand from personal w/c with BUE support and mod A, 50% cueing to decrease caregiver burden.  (12/17/15)   Time 4   Period Weeks   Status New     Additional Short Term Goals   Additional Short Term Goals Yes     PT SHORT TERM GOAL #6   Title Pt will perform static standing with BUE support x1 minute with supervision, no overt LOB to decrease caregiver burden.  (12/17/15)   Time 4   Period Weeks   Status New     PT SHORT TERM GOAL #7   Title PT will assess current wheelchair and determine if alternative seating system would better maximize pt safety with functional mobility.  (12/17/15)   Time 4   Period Weeks   Status New           PT Long  Term Goals - 11/19/15 2034      PT LONG TERM GOAL #1   Title Pt will consistently perform supine <> sit with supervision, 25% cueing to indicate increased independence getting out of bed.   (Target: 01/14/16)   Time 8   Period Weeks   Status New     PT LONG TERM GOAL #2   Title Pt will demonstrate midline posture in static sitting with BLE support with subtle to min cueing to indicate increased postural awareness/control. (01/14/16)   Time 8   Period Weeks   Status New  PT LONG TERM GOAL #3   Title Pt will consistently perform level transfers from w/c <> mat table with min A, 25% cueing to decrease caregiver burden.  (01/14/16)   Time 8   Period Weeks   Status New     PT LONG TERM GOAL #4   Title Pt will consistently perform sit <> stand from personal w/c with BUE support and min A, 25% cueing to decrease caregiver burden.  (01/14/16)   Time 8   Period Weeks   Status New     PT LONG TERM GOAL #5   Title PT will assess gait to determine if ambulation is safe/functional.  (01/14/16)   Time 8   Period Weeks   Status New               Plan - 11/24/15 1809    Clinical Impression Statement Session focused on use of standing frame for increased L hip extensibility, spasticity management, and for LE activation. Standing frame utilized for 16 minutes consecutively; time limited by L hip discomfort. Will contact MD regarding hip pain, which appears more limiting at this time as compared with previous sessions.    Rehab Potential Fair   Clinical Impairments Affecting Rehab Potential Positive: strong family/social support; negative: chronicity of condition   PT Frequency 3x / week  followed by 2x/week for 4 weeks   PT Duration 4 weeks  followed by 2x/week for 4 weeks   PT Treatment/Interventions ADLs/Self Care Home Management;DME Instruction;Gait training;Stair training;Functional mobility training;Therapeutic activities;Patient/family education;Cognitive  remediation;Neuromuscular re-education;Therapeutic exercise;Balance training;Orthotic Fit/Training;Wheelchair mobility training;Manual techniques;Splinting;Visual/perceptual remediation/compensation;Vestibular;Passive range of motion   PT Next Visit Plan Address STG's: bed mobility, slide board transfers, sitting postural awareness/self-correction.   Consulted and Agree with Plan of Care Patient;Family member/caregiver   Family Member Consulted caregiver, [name]      Patient will benefit from skilled therapeutic intervention in order to improve the following deficits and impairments:  Abnormal gait, Decreased balance, Decreased endurance, Decreased cognition, Cardiopulmonary status limiting activity, Decreased activity tolerance, Decreased coordination, Decreased strength, Impaired flexibility, Impaired tone, Decreased mobility, Decreased skin integrity, Increased muscle spasms, Impaired vision/preception, Decreased range of motion, Impaired UE functional use, Postural dysfunction, Pain  Visit Diagnosis: Other abnormalities of gait and mobility  Stiffness of left hip, not elsewhere classified  Unsteadiness on feet  Muscle weakness (generalized)  Abnormal posture     Problem List Patient Active Problem List   Diagnosis Date Noted  . Tracheostomy tube present ([place])   . Tracheal stenosis   . Chronic respiratory failure with hypoxia ([place])   . 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[place]) 10/20/2015  . Tracheostomy status ([place]) 09/26/2015  . Allergic rhinitis 09/26/2015  . 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 Movement disorder 09/22/2015  . Encephalitis 09/22/2015  . Chest pain 02/07/2014  . Premature atrial contractions 01/13/2014  . Abnormality of gait 12/04/2013  . Hyperlipidemia 12/21/2011  . Cardiovascular risk factor 12/21/2011  . Bunion 01/31/2008  . Metatarsalgia of both feet 09/20/2007  . FLAT FOOT 09/20/2007  . HALLUX RIGIDUS, ACQUIRED 09/20/2007    Billie Ruddy, PT, DPT [facility] [address] [place], [place], [address] Phone: [phone]   Fax:  [phone] 11/24/15, 6:13 PM  Name: Judy Spears MRN: [id number] Date of Birth: 1950/04/25

## 2015-11-24 NOTE — Therapy (Signed)
[facility] [address] [place], [place], [address] Phone: [phone]   Fax:  [phone]  Speech Language Pathology Treatment  Patient Details  Name: Judy Spears MRN: [id number] Date of Birth: 1950-02-21 Referring Provider: Dr. Alger Simons  Encounter Date: 11/24/2015      End of Session - 11/24/15 1139    Visit Number 25   Number of Visits 36   Date for SLP Re-Evaluation 01/02/16   SLP Start Time 0935   SLP Stop Time  [id number]   SLP Time Calculation (min) 40 min   Activity Tolerance Patient tolerated treatment well      Past Medical History:  Diagnosis Date  . Arthritis    lt great toe  . Complication of anesthesia    pt has had headaches post op-did advise to hydra well preop  . Hair loss    right-sided  . History of exercise stress test    a. ETT (12/15) with 12:00 exercise, no ST changes, occasional PACs.   . Hyperlipidemia   . Insomnia   . Migraine headache   . 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 Postmenopausal   . Premature atrial contractions    a. Holter (12/15) with 8% PACs, no atrial runs or atrial fibrillation.     Past Surgical History:  Procedure Laterality Date  . BREAST BIOPSY  01/01/2011   Procedure: BREAST BIOPSY WITH NEEDLE LOCALIZATION;  Surgeon: Rolm Bookbinder, MD;  Location: [place];  Service: General;  Laterality: Left;  left breast wire localization biopsy  . cataracts right eye    . CESAREAN SECTION  1986  . HERNIA REPAIR  2000   RIH  . opirectinal membrane peel    . RHINOPLASTY  1983    There were no vitals filed for this visit.      Subjective Assessment - 11/24/15 1129    Subjective Pt bright and engaged   Patient is accompained by: --  spouse and caregiver   Currently in Pain? No/denies               ADULT SLP TREATMENT - 11/24/15 1129      General Information   Behavior/Cognition Alert;Cooperative;Pleasant mood     Treatment Provided   Treatment provided Dysphagia;Cognitive-Linquistic     Dysphagia Treatment   Respiratory Status Room air   Oral Cavity - Dentition Adequate natural dentition   Treatment Methods Skilled observation;Upgraded PO texture trial;Patient/caregiver education   Patient observed directly with PO's Yes   Type of PO's observed Ice chips   Feeding Needs assist   Oral Phase Signs & Symptoms Anterior loss/spillage   Pharyngeal Phase Signs & Symptoms Suspected delayed swallow initiation   Type of cueing Verbal;Visual   Amount of cueing Moderate     Pain Assessment   Pain Assessment No/denies pain     Cognitive-Linquistic Treatment   Treatment focused on Voice;Dysarthria;Cognition   Skilled Treatment Pt alert and engaged throughout the session with one episode of UE stretch/yawn to reset effort.  Pt able to demonstrate aprroximately 70% acc with 1-word responses with mod A to facilitate voicing, throat clear and adequate respiratory support. Pt is demonstrating mild loss of air through the stoma and benefited from occlusion by therapist despite presence of bandange. Recall of personal information ~70% acc for names/locations of grandchildren.      Assessment / Recommendations / Plan   Plan Continue with current plan of care  Progression Toward Goals   Progression toward goals Progressing toward goals            SLP Short Term Goals - 11/24/15 1143      SLP SHORT TERM GOAL #1   Title Pt will demonstrate audible phonation with words, vowels, or syllables in 4/10 trials with max A   Baseline achieved 10/30- will maintain to establish consistency   Status On-going     SLP SHORT TERM GOAL #3   Title Pt will swallow ice chip/H20 via spoon in less than 7 seconds with usual min to mod A 3/5 trials   Baseline 9/25, 10/30   Status On-going     SLP SHORT TERM GOAL #4   Title Pt will sustain attention to therapy task for over 7 minutes with occasional min A   Status  Achieved          SLP Long Term Goals - 11/20/15 1355      SLP LONG TERM GOAL #1   Title Pt will imitate one to two syllables with voicing in upright position 50% of the time.   Time 4   Period Weeks   Status New     SLP LONG TERM GOAL #2   Title pt will demonstrate adequate breath support for speech 25% of the time with imitative responses and max cues usually (50-80%)   Time 4   Period Weeks   Status New     SLP LONG TERM GOAL #3   Title after oral care, pt will achieve initiation of oral phase of the swallow with ice chip/water trials with average <6 seconds over 4 sessions   Time 4   Period Weeks   Status New          Plan - 11/24/15 1140    Clinical Impression Statement Focus on speech/voicing and swallowing today. Improvement noted this date with oral containment (1episode of anterior spillage in 7 trials) and facilitation of pharyngeal phase (verbal cueing only to initiate). Persistent significant deficits in speech and swallowing exist. Skilled SLP services cont to be warranted to decrease caregiver burden by maximizing pt speech/languge as well as swallowing function.   Speech Therapy Frequency 3x / week   Duration 4 weeks   Treatment/Interventions Aspiration precaution training;Trials of upgraded texture/liquids;Compensatory strategies;Functional tasks;Patient/family education;Cueing hierarchy;Diet toleration management by SLP;Cognitive reorganization;Compensatory techniques;SLP instruction and feedback;Internal/external aids;Multimodal communcation approach      Patient will benefit from skilled therapeutic intervention in order to improve the following deficits and impairments:   Dysphagia, oropharyngeal phase  Dysarthria and anarthria  Cognitive communication deficit    Problem List Patient Active Problem List   Diagnosis Date Noted  . Tracheostomy tube present ([place])   . Tracheal stenosis   . Chronic respiratory failure with hypoxia ([place])   . Spastic  tetraplegia ([place]) 10/20/2015  . Tracheostomy status ([place]) 09/26/2015  . Allergic rhinitis 09/26/2015  . 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 Movement disorder 09/22/2015  . Encephalitis 09/22/2015  . Chest pain 02/07/2014  . Premature atrial contractions 01/13/2014  . Abnormality of gait 12/04/2013  . Hyperlipidemia 12/21/2011  . Cardiovascular risk factor 12/21/2011  . Bunion 01/31/2008  . Metatarsalgia of both feet 09/20/2007  . FLAT FOOT 09/20/2007  . HALLUX RIGIDUS, ACQUIRED 09/20/2007    Vinetta Bergamo MA, CCC-SLP 11/24/2015, 11:45 AM  [facility] [address] [place], [place], [address] Phone: [phone]   Fax:  [phone]   Name: Judy Spears MRN: [id number]  Date of Birth: 1950/10/22

## 2015-11-24 NOTE — Telephone Encounter (Signed)
Called pts husband and appt has been scheduled for the pt to come in for trach check.

## 2015-11-25 ENCOUNTER — Ambulatory Visit: Payer: BC Managed Care – PPO | Admitting: Physical Therapy

## 2015-11-25 ENCOUNTER — Ambulatory Visit: Payer: BC Managed Care – PPO | Admitting: Speech Pathology

## 2015-11-25 ENCOUNTER — Encounter: Payer: Self-pay | Admitting: Occupational Therapy

## 2015-11-25 ENCOUNTER — Ambulatory Visit: Payer: BC Managed Care – PPO | Admitting: Occupational Therapy

## 2015-11-25 DIAGNOSIS — R1312 Dysphagia, oropharyngeal phase: Secondary | ICD-10-CM | POA: Diagnosis not present

## 2015-11-25 DIAGNOSIS — M25612 Stiffness of left shoulder, not elsewhere classified: Secondary | ICD-10-CM

## 2015-11-25 DIAGNOSIS — R293 Abnormal posture: Secondary | ICD-10-CM

## 2015-11-25 DIAGNOSIS — M25611 Stiffness of right shoulder, not elsewhere classified: Secondary | ICD-10-CM

## 2015-11-25 DIAGNOSIS — R278 Other lack of coordination: Secondary | ICD-10-CM

## 2015-11-25 DIAGNOSIS — R2681 Unsteadiness on feet: Secondary | ICD-10-CM

## 2015-11-25 DIAGNOSIS — M6281 Muscle weakness (generalized): Secondary | ICD-10-CM

## 2015-11-25 DIAGNOSIS — R471 Dysarthria and anarthria: Secondary | ICD-10-CM

## 2015-11-25 DIAGNOSIS — R41844 Frontal lobe and executive function deficit: Secondary | ICD-10-CM

## 2015-11-25 DIAGNOSIS — R2689 Other abnormalities of gait and mobility: Secondary | ICD-10-CM

## 2015-11-25 NOTE — Therapy (Signed)
[facility] [address] [place], [place], [address] Phone: [phone]   Fax:  [phone]  Occupational Therapy Treatment  Patient Details  Name: Judy Spears MRN: [id number] Date of Birth: 07-21-1950 Referring Provider: Dr. Sharion Dove  Encounter Date: 11/25/2015      OT End of Session - 11/25/15 1310    Visit Number 26   Number of Visits 44   Date for OT Re-Evaluation 01/25/16   Authorization Type BCBS unlimited visits   OT Start Time 1016   OT Stop Time 1100   OT Time Calculation (min) 44 min   Activity Tolerance Patient tolerated treatment well      Past Medical History:  Diagnosis Date  . Arthritis    lt great toe  . Complication of anesthesia    pt has had headaches post op-did advise to hydra well preop  . Hair loss    right-sided  . History of exercise stress test    a. ETT (12/15) with 12:00 exercise, no ST changes, occasional PACs.   . Hyperlipidemia   . Insomnia   . Migraine headache   . 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 Postmenopausal   . Premature atrial contractions    a. Holter (12/15) with 8% PACs, no atrial runs or atrial fibrillation.     Past Surgical History:  Procedure Laterality Date  . BREAST BIOPSY  01/01/2011   Procedure: BREAST BIOPSY WITH NEEDLE LOCALIZATION;  Surgeon: Rolm Bookbinder, MD;  Location: [facility];  Service: General;  Laterality: Left;  left breast wire localization biopsy  . cataracts right eye    . CESAREAN SECTION  1986  . HERNIA REPAIR  2000   RIH  . opirectinal membrane peel    . RHINOPLASTY  1983    There were no vitals filed for this visit.      Subjective Assessment - 11/25/15 1302    Subjective  Yes when asked if sliding board makes transfer easier   Patient Stated Goals Pt unable to state.    Currently in Pain? No/denies                      OT Treatments/Exercises (OP) - 11/25/15 1302      ADLs   Functional Mobility Practiced sliding board transfers with [name], caregiver.  After practice and repetition , caregiver able to return demonstrate.  [name] states she would like more practice.  Pt able to indicate that sliding board transfer was easiert. Discussed that this type of transfer is safer for pt, caregivers and also protects pt's ankles and knees as she is unable to pivot during transfers.      Neurological Re-education Exercises   Other Exercises 2 Addressed sitting balance, trunk control, weight shifting in sitting, side leaning onto R elbow with rotation and forward lean.  Pt with some improvement to intiate these movements in all planes, especially if related to functional task.                 OT Education - 11/25/15 1307    Education provided Yes   Education Details sliding board transfers   [employer]) Educated Patient;Caregiver(s)   Methods Explanation;Demonstration;Tactile cues;Verbal cues   Comprehension Verbalized understanding;Need further instruction;Returned demonstration          OT Short Term Goals - 11/25/15 1308      OT SHORT TERM GOAL #1   Title Pt and husband will  be mod I with HEP - 12/18/2015   Status On-going     OT SHORT TERM GOAL #2   Title Pt will demonstrate adequate postural control for breath support to voice at least 2 words   Status Achieved     OT SHORT TERM GOAL #3   Title Pt will transfer to commode with moderate assistance 25% of the time.   Status On-going     OT SHORT TERM GOAL #4   Title Pt will tolerate positional changes as evidenced by respiration rate and motor quieting in preparation for funcitonal mobility   Status Achieved           OT Long Term Goals - 11/25/15 1308      OT LONG TERM GOAL #1   Title Pt and husband will be mod I with upgraded HEP - 01/15/2016   Status On-going     OT LONG TERM GOAL #2   Title Pt will demonstrate adequate postural control to support 3 words in supported sitting.    Status On-going     OT LONG TERM GOAL #3   Title Pt will demonstrate ability for low reach with RUE for functional task with  mod facilitation   Status Achieved     OT LONG TERM GOAL #4   Title Pt will wash face with LUE with min a in supported sitting   Status Achieved     OT LONG TERM GOAL #5   Title Pt will release objects with L hand with min faciltation    Status Achieved     OT LONG TERM GOAL #6   Title Pt will stand with min a while caregiver manages pants during LB dressing 75% of the time.   Status On-going     OT LONG TERM GOAL #7   Title Pt will transfer to commode with mod a 50% of the time.   Status On-going     OT LONG TERM GOAL #8   Title Pt will be able to sit unsupported with supervision 50% of the time while engaged in functional task.   Status On-going               Plan - 11/25/15 1308    Clinical Impression Statement Pt and caregiver working toward independence in sliding board transfers.  Family conference scheduled for this week.   Rehab Potential Fair   Clinical Impairments Affecting Rehab Potential severity of deficits, slow rate of recovery   OT Frequency Other (comment)   OT Duration 8 weeks   OT Treatment/Interventions Self-care/ADL training;Ultrasound;Moist Heat;Electrical Stimulation;DME and/or AE instruction;Neuromuscular education;Therapeutic exercise;Functional Mobility Training;Manual Therapy;Passive range of motion;Splinting;Therapeutic activities;Balance training;Patient/family education;Visual/perceptual remediation/compensation;Cognitive remediation/compensation   Plan sliding board transfers, sitting balance, sit to stand, forward weight shift, functional use of UE's   Consulted and Agree with Plan of Care Patient;Family member/caregiver   Family Member Consulted caregiver [name]      Patient will benefit from skilled therapeutic intervention in order to improve the following deficits and impairments:  Decreased endurance, Decreased  skin integrity, Impaired vision/preception, Improper body mechanics, Impaired perceived functional ability, Decreased knowledge of precautions, Cardiopulmonary status limiting activity, Decreased activity tolerance, Decreased knowledge of use of DME, Decreased strength, Impaired flexibility, Improper spinal/pelvic alignment, Impaired sensation, Difficulty walking, Decreased mobility, Decreased balance, Decreased cognition, Decreased range of motion, Impaired tone, Pain, Impaired UE functional use, Decreased safety awareness, Decreased coordination  Visit Diagnosis: Abnormal posture  Muscle weakness (generalized)  Unsteadiness on feet  Stiffness of left shoulder, not  elsewhere classified  Stiffness of right shoulder, not elsewhere classified  Frontal lobe and executive function deficit    Problem List Patient Active Problem List   Diagnosis Date Noted  . Tracheostomy tube present ([place])   . Tracheal stenosis   . Chronic respiratory failure with hypoxia ([place])   . 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 Tracheostomy status ([place]) 09/26/2015  . Allergic rhinitis 09/26/2015  . 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 Movement disorder 09/22/2015  . Encephalitis 09/22/2015  . Chest pain 02/07/2014  . Premature atrial contractions 01/13/2014  . Abnormality of gait 12/04/2013  . Hyperlipidemia 12/21/2011  . Cardiovascular risk factor 12/21/2011  . Bunion 01/31/2008  . Metatarsalgia of both feet 09/20/2007  . FLAT FOOT 09/20/2007  . HALLUX RIGIDUS, ACQUIRED 09/20/2007    Quay Burow, OTR/L 11/25/2015, 1:12 PM  [facility] [address] [place], [place], [address] Phone: [phone]   Fax:  [phone]  Name: Judy Spears MRN: [id number] Date of Birth: 16-Apr-1950

## 2015-11-25 NOTE — Therapy (Signed)
[facility] [address] [place], [place], [address] Phone: [phone]   Fax:  [phone]  Speech Language Pathology Treatment  Patient Details  Name: Judy Spears MRN: [id number] Date of Birth: 07-30-1950 Referring Provider: Dr. Alger Simons  Encounter Date: 11/25/2015      End of Session - 11/25/15 1228    Visit Number 26   Number of Visits 36   Date for SLP Re-Evaluation 01/02/16   SLP Start Time 1103   SLP Stop Time  1145   SLP Time Calculation (min) 42 min   Activity Tolerance Patient tolerated treatment well      Past Medical History:  Diagnosis Date  . Arthritis    lt great toe  . Complication of anesthesia    pt has had headaches post op-did advise to hydra well preop  . Hair loss    right-sided  . History of exercise stress test    a. ETT (12/15) with 12:00 exercise, no ST changes, occasional PACs.   . Hyperlipidemia   . Insomnia   . Migraine headache   . 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 Postmenopausal   . Premature atrial contractions    a. Holter (12/15) with 8% PACs, no atrial runs or atrial fibrillation.     Past Surgical History:  Procedure Laterality Date  . BREAST BIOPSY  01/01/2011   Procedure: BREAST BIOPSY WITH NEEDLE LOCALIZATION;  Surgeon: Rolm Bookbinder, MD;  Location: [facility];  Service: General;  Laterality: Left;  left breast wire localization biopsy  . cataracts right eye    . CESAREAN SECTION  1986  . HERNIA REPAIR  2000   RIH  . opirectinal membrane peel    . RHINOPLASTY  1983    There were no vitals filed for this visit.      Subjective Assessment - 11/25/15 1214    Subjective Pt bright and engaged   Patient is accompained by: --  caregiver [name]   Currently in Pain? No/denies               ADULT SLP TREATMENT - 11/25/15 1215      General Information   Behavior/Cognition Alert;Cooperative;Pleasant mood     Treatment Provided   Treatment provided Dysphagia;Cognitive-Linquistic     Dysphagia Treatment   Treatment Methods Skilled observation;Upgraded PO texture trial;Patient/caregiver education   Patient observed directly with PO's Yes   Type of PO's observed Ice chips   Feeding Needs assist   Oral Phase Signs & Symptoms Anterior loss/spillage   Pharyngeal Phase Signs & Symptoms Suspected delayed swallow initiation;Multiple swallows   Type of cueing Verbal;Tactile;Visual   Amount of cueing Moderate     Pain Assessment   Pain Assessment No/denies pain     Cognitive-Linquistic Treatment   Treatment focused on Dysarthria;Cognition;Voice   Skilled Treatment Pt achieved audible but quiet phonation with ongoing cues visual and tactile cues / UE expanstion/ reclined position for breath support (inhalation) prior to vocalization.  Audible phonation achieved 70% of attempts with vowels and 1 word utterances. Pt demostrated attempt to participate in conversation , asking ST where is your husband" when I told her is is out of town - Repetition x3 and context cues for utterance to be understood.     Assessment / Recommendations / Plan   Plan Continue with current plan of care     Progression Toward Goals   Progression toward goals Progressing toward goals  SLP Short Term Goals - 11/25/15 1227      SLP SHORT TERM GOAL #1   Title Pt will demonstrate audible phonation with words, vowels, or syllables in 4/10 trials with max A   Baseline achieved 10/30- will maintain to establish consistency   Status On-going     SLP SHORT TERM GOAL #3   Title Pt will swallow ice chip/H20 via spoon in less than 7 seconds with usual min to mod A 3/5 trials   Baseline 9/25, 10/30, 10/31   Status On-going     SLP SHORT TERM GOAL #4   Title Pt will sustain attention to therapy task for over 7 minutes with occasional min A   Status Achieved          SLP Long Term Goals - 11/25/15 1228      SLP  LONG TERM GOAL #1   Title Pt will imitate one to two syllables with voicing in upright position 50% of the time.   Time 3   Period Weeks   Status New     SLP LONG TERM GOAL #2   Title pt will demonstrate adequate breath support for speech 25% of the time with imitative responses and max cues usually (50-80%)   Time 3   Period Weeks   Status New     SLP LONG TERM GOAL #3   Title after oral care, pt will achieve initiation of oral phase of the swallow with ice chip/water trials with average <6 seconds over 4 sessions   Time 4   Period Weeks   Status New          Plan - 11/25/15 1224    Clinical Impression Statement Pt with improved swallow to command and improved timeliness of dry or double swallow to command. Labial leakage occurred 4x. Quiet but audible phonation achieved with mod A. Pt's attempts to converse continue to required max A for intelligibility.    Speech Therapy Frequency 3x / week   Treatment/Interventions Aspiration precaution training;Trials of upgraded texture/liquids;Compensatory strategies;Functional tasks;Patient/family education;Cueing hierarchy;Diet toleration management by SLP;Cognitive reorganization;Compensatory techniques;SLP instruction and feedback;Internal/external aids;Multimodal communcation approach   Potential to Achieve Goals Fair   Potential Considerations Ability to learn/carryover information;Co-morbidities;Severity of impairments   Consulted and Agree with Plan of Care Family member/caregiver   Family Member Consulted caregiver [name]      Patient will benefit from skilled therapeutic intervention in order to improve the following deficits and impairments:   Dysphagia, oropharyngeal phase  Dysarthria and anarthria    Problem List Patient Active Problem List   Diagnosis Date Noted  . Tracheostomy tube present ([facility])   . Tracheal stenosis   . Chronic respiratory failure with hypoxia ([facility])   . 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[facility]) 10/20/2015  .  Tracheostomy status ([facility]) 09/26/2015  . Allergic rhinitis 09/26/2015  . 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 Movement disorder 09/22/2015  . Encephalitis 09/22/2015  . Chest pain 02/07/2014  . Premature atrial contractions 01/13/2014  . Abnormality of gait 12/04/2013  . Hyperlipidemia 12/21/2011  . Cardiovascular risk factor 12/21/2011  . Bunion 01/31/2008  . Metatarsalgia of both feet 09/20/2007  . FLAT FOOT 09/20/2007  . HALLUX RIGIDUS, ACQUIRED 09/20/2007    Trust Crago, Annye Rusk MS, CCC-SLP 11/25/2015, 12:29 PM  [facility] [address] [place], [place], [address] Phone: [phone]   Fax:  [phone]   Name: Judy Spears MRN: [id number] Date of Birth: 1951-01-25

## 2015-11-25 NOTE — Therapy (Signed)
[facility] [address] [place], [place], [address] Phone: [phone]   Fax:  [phone]  Physical Therapy Treatment  Patient Details  Name: Judy Spears MRN: [id number] Date of Birth: Aug 06, 1950 Referring Provider: Alger Simons, MD  Encounter Date: 11/25/2015      PT End of Session - 11/25/15 1418    Visit Number 27   Number of Visits 45   Date for PT Re-Evaluation 01/18/16   Authorization Type [employer]   PT Start Time 1149   PT Stop Time 1239   PT Time Calculation (min) 50 min   Activity Tolerance Patient tolerated treatment well   Behavior During Therapy --  Tearful for ~10 minutes of this session      Past Medical History:  Diagnosis Date  . Arthritis    lt great toe  . Complication of anesthesia    pt has had headaches post op-did advise to hydra well preop  . Hair loss    right-sided  . History of exercise stress test    a. ETT (12/15) with 12:00 exercise, no ST changes, occasional PACs.   . Hyperlipidemia   . Insomnia   . Migraine headache   . 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 Postmenopausal   . Premature atrial contractions    a. Holter (12/15) with 8% PACs, no atrial runs or atrial fibrillation.     Past Surgical History:  Procedure Laterality Date  . BREAST BIOPSY  01/01/2011   Procedure: BREAST BIOPSY WITH NEEDLE LOCALIZATION;  Surgeon: Rolm Bookbinder, MD;  Location: [facility];  Service: General;  Laterality: Left;  left breast wire localization biopsy  . cataracts right eye    . CESAREAN SECTION  1986  . HERNIA REPAIR  2000   RIH  . opirectinal membrane peel    . RHINOPLASTY  1983    There were no vitals filed for this visit.      Subjective Assessment - 11/24/15 1805    Subjective Caregiver reports eventful weekend, as pt attended grandson's 5th birthday party.    Patient is accompained by: Family member  caregiver, [name]; husband present for  initial 15 minutes   Pertinent History Precautions:  PEG, seizure. 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 No/denies  No pain at rest; overt signs of discomfort with increased L hip extension, attempted hip mobilization           Therapeutic Activities: PT explained and demonstrated to husband (reviewed with caregiver, who has initiated training with OT) hand-over-back technique for transfer assist, importance of placing pt in full anterior weight shift to mitigate extensor tone, and use of A/P weight shift (rather than physically lifting using UE's) to transfer patient. Also educated caregiver/husband on use of UE reaching target (for example, arms of chair facing patient) to initiate active anterior weight shift in preparation to transfer. Caregiver safely utilized this technique for lateral scooting transfer using slide board. Once pt seated EOM, husband attempted to utilize this technique for lateral scooting on mat table; however, pt became tearful. Episode of tearfulness resolved after husband consoling patient. Transitioned to NMR; see below.  NMR:  Blocked practice of supine rolling in  B directions for increased trunk dissociation, isolated cervical spine rotation; supine <> sit for closed chain R trunk shortening. Pt performed sit > supine (to R side) with pt able to perform sit > R forearm propping with 50% cueing and min guard; pt required max A to control trunk descent into R sidelying and for BLE management (pt able to perform initial 75% of LE elevation, but required assist to lift BLE's fully onto mat). Once in supine, pt performed blocked practice of R/L rolling.Pt initially required mod A for  rolling in B directions; cueing emphasized UE movement across midline, cervical spine rotation, and B knee movement to initiate rolling. Pt able to perform final roll to R with min guard, 50% cueing; and final roll to R with supervision and 25% cueing. Also performed supine partial bridging 3 x10-sec holds for gluteus maximus activation without onset of hamstring tone. Supine > sit with max A for BLE management (pt unable to perform active B knee extension, which was limited by hamstring tone) and at trunk for transition from R side lying > sit.                      PT Education - 11/24/15 1806    Education provided Yes   Education Details Recommended pt begin attempting semi reclined (progressing to supine) position at home for increased hip flexor extensibility. Explained to caregiver and husband the importance of maintaining functional muscle length.   Person(s) Educated Patient;Spouse;Caregiver(s)   Methods Explanation   Comprehension Verbalized understanding          PT Short Term Goals - 11/19/15 2029      PT SHORT TERM GOAL #1   Title Pt will consistently perform supine rolling to in B directions with supervision, 25% cueing to indicate increased trunk dissociation, increased independence with bed mobility.  (Target date:12/17/15)    Time 4   Period Weeks   Status New     PT SHORT TERM GOAL #2   Title Pt will consistently perform supine <> sit with min A, 25% cueing to indicate increased independence getting out of bed. (12/17/15)   Time 4   Period Weeks   Status New     PT SHORT TERM GOAL #3   Title Pt will demonstrate midline posture in static sitting with BLE support with 50% verbal cueing to indicate increased postural awareness/control.  (12/17/15)   Time 4   Period Weeks   Status New     PT SHORT TERM GOAL #4   Title Pt will consistently perform level transfers from w/c <> mat table with mod A, 50% cueing to decrease caregiver burden.  (12/17/15)    Time 4   Period Weeks   Status New     PT SHORT TERM GOAL #5   Title Pt will consistently perform sit <> stand from personal w/c with BUE support and mod A, 50% cueing to decrease caregiver burden.  (12/17/15)   Time 4   Period Weeks   Status New     Additional Short Term Goals   Additional Short Term Goals Yes     PT SHORT TERM GOAL #6   Title Pt will perform static standing with BUE support x1 minute with supervision, no overt LOB to decrease caregiver burden.  (12/17/15)   Time 4   Period Weeks   Status New     PT SHORT TERM GOAL #7   Title PT will assess current wheelchair and determine if  alternative seating system would better maximize pt safety with functional mobility.  (12/17/15)   Time 4   Period Weeks   Status New           PT Long Term Goals - 11/19/15 2034      PT LONG TERM GOAL #1   Title Pt will consistently perform supine <> sit with supervision, 25% cueing to indicate increased independence getting out of bed.   (Target: 01/14/16)   Time 8   Period Weeks   Status New     PT LONG TERM GOAL #2   Title Pt will demonstrate midline posture in static sitting with BLE support with subtle to min cueing to indicate increased postural awareness/control. (01/14/16)   Time 8   Period Weeks   Status New     PT LONG TERM GOAL #3   Title Pt will consistently perform level transfers from w/c <> mat table with min A, 25% cueing to decrease caregiver burden.  (01/14/16)   Time 8   Period Weeks   Status New     PT LONG TERM GOAL #4   Title Pt will consistently perform sit <> stand from personal w/c with BUE support and min A, 25% cueing to decrease caregiver burden.  (01/14/16)   Time 8   Period Weeks   Status New     PT LONG TERM GOAL #5   Title PT will assess gait to determine if ambulation is safe/functional.  (01/14/16)   Time 8   Period Weeks   Status New               Plan - 11/25/15 1419    Clinical Impression Statement Skilled session  focused on providing hands-on training for functional transfers with caregiver and husband. Pt did demonstrate an episode of tearfulness for approx. 10 minutes during transfer training this session.  Remainder of session emphasized bed mobility training for trunk dissociation. After blocked practice of supine rolling, pt able to perform final roll to R with supervision, 25% cueing.    Rehab Potential Fair   Clinical Impairments Affecting Rehab Potential Positive: strong family/social support; negative: chronicity of condition   PT Frequency 3x / week  followed by 2x/week for 4 weeks   PT Duration 4 weeks  followed by 2x/week for 4 weeks   PT Treatment/Interventions ADLs/Self Care Home Management;DME Instruction;Gait training;Stair training;Functional mobility training;Therapeutic activities;Patient/family education;Cognitive remediation;Neuromuscular re-education;Therapeutic exercise;Balance training;Orthotic Fit/Training;Wheelchair mobility training;Manual techniques;Splinting;Visual/perceptual remediation/compensation;Vestibular;Passive range of motion   PT Next Visit Plan Address STG's: bed mobility, slide board transfers, sitting postural awareness/self-correction.   Consulted and Agree with Plan of Care Patient;Family member/caregiver   Family Member Consulted caregiver, [name]      Patient will benefit from skilled therapeutic intervention in order to improve the following deficits and impairments:  Abnormal gait, Decreased balance, Decreased endurance, Decreased cognition, Cardiopulmonary status limiting activity, Decreased activity tolerance, Decreased coordination, Decreased strength, Impaired flexibility, Impaired tone, Decreased mobility, Decreased skin integrity, Increased muscle spasms, Impaired vision/preception, Decreased range of motion, Impaired UE functional use, Postural dysfunction, Pain  Visit Diagnosis: Other abnormalities of gait and mobility  Other lack of  coordination  Abnormal posture  Unsteadiness on feet     Problem List Patient Active Problem List   Diagnosis Date Noted  . Tracheostomy tube present ([facility])   . Tracheal stenosis   . Chronic respiratory failure with hypoxia ([facility])   . 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[facility]) 10/20/2015  . Tracheostomy status ([facility]) 09/26/2015  . Allergic rhinitis 09/26/2015  .  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 Movement disorder 09/22/2015  . Encephalitis 09/22/2015  . Chest pain 02/07/2014  . Premature atrial contractions 01/13/2014  . Abnormality of gait 12/04/2013  . Hyperlipidemia 12/21/2011  . Cardiovascular risk factor 12/21/2011  . Bunion 01/31/2008  . Metatarsalgia of both feet 09/20/2007  . FLAT FOOT 09/20/2007  . HALLUX RIGIDUS, ACQUIRED 09/20/2007   Billie Ruddy, PT, DPT [facility] [address] [place], [place], [address] Phone: [phone]   Fax:  [phone] 11/25/15, 2:54 PM  Name: Judy Spears MRN: [id number] Date of Birth: 05/13/1950

## 2015-11-27 ENCOUNTER — Ambulatory Visit: Payer: BC Managed Care – PPO | Admitting: Occupational Therapy

## 2015-11-27 ENCOUNTER — Encounter: Payer: Self-pay | Admitting: Occupational Therapy

## 2015-11-27 ENCOUNTER — Ambulatory Visit: Payer: BC Managed Care – PPO | Attending: Physical Medicine & Rehabilitation | Admitting: Physical Therapy

## 2015-11-27 ENCOUNTER — Ambulatory Visit: Payer: BC Managed Care – PPO

## 2015-11-27 DIAGNOSIS — M6281 Muscle weakness (generalized): Secondary | ICD-10-CM

## 2015-11-27 DIAGNOSIS — R2689 Other abnormalities of gait and mobility: Secondary | ICD-10-CM | POA: Diagnosis not present

## 2015-11-27 DIAGNOSIS — M25622 Stiffness of left elbow, not elsewhere classified: Secondary | ICD-10-CM | POA: Diagnosis present

## 2015-11-27 DIAGNOSIS — R471 Dysarthria and anarthria: Secondary | ICD-10-CM | POA: Insufficient documentation

## 2015-11-27 DIAGNOSIS — R1312 Dysphagia, oropharyngeal phase: Secondary | ICD-10-CM | POA: Diagnosis present

## 2015-11-27 DIAGNOSIS — R41844 Frontal lobe and executive function deficit: Secondary | ICD-10-CM

## 2015-11-27 DIAGNOSIS — M25611 Stiffness of right shoulder, not elsewhere classified: Secondary | ICD-10-CM | POA: Diagnosis present

## 2015-11-27 DIAGNOSIS — M25612 Stiffness of left shoulder, not elsewhere classified: Secondary | ICD-10-CM | POA: Diagnosis present

## 2015-11-27 DIAGNOSIS — R278 Other lack of coordination: Secondary | ICD-10-CM

## 2015-11-27 DIAGNOSIS — R293 Abnormal posture: Secondary | ICD-10-CM

## 2015-11-27 DIAGNOSIS — R41841 Cognitive communication deficit: Secondary | ICD-10-CM | POA: Insufficient documentation

## 2015-11-27 DIAGNOSIS — R2681 Unsteadiness on feet: Secondary | ICD-10-CM

## 2015-11-27 DIAGNOSIS — M25652 Stiffness of left hip, not elsewhere classified: Secondary | ICD-10-CM | POA: Diagnosis present

## 2015-11-27 NOTE — Therapy (Signed)
[facility] [address] [place], [place], [address] Phone: [phone]   Fax:  [phone]  Physical Therapy Treatment  Patient Details  Name: Judy Spears MRN: [id number] Date of Birth: [birth year] Referring Provider: Alger Simons, MD  Encounter Date: 11/27/2015      PT End of Session - 11/27/15 2230    Visit Number 28   Number of Visits 45   Date for PT Re-Evaluation 01/18/16   Authorization Type BC[employer]   PT Start Time 1105   PT Stop Time 1145   PT Time Calculation (min) 40 min   Activity Tolerance Patient tolerated treatment well   Behavior During Therapy [facility] for tasks assessed/performed      Past Medical History:  Diagnosis Date  . Arthritis    lt great toe  . Complication of anesthesia    pt has had headaches post op-did advise to hydra well preop  . Hair loss    right-sided  . History of exercise stress test    a. ETT (12/15) with 12:00 exercise, no ST changes, occasional PACs.   . Hyperlipidemia   . Insomnia   . Migraine headache   . 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 Postmenopausal   . Premature atrial contractions    a. Holter (12/15) with 8% PACs, no atrial runs or atrial fibrillation.     Past Surgical History:  Procedure Laterality Date  . BREAST BIOPSY  01/01/2011   Procedure: BREAST BIOPSY WITH NEEDLE LOCALIZATION;  Surgeon: Rolm Bookbinder, MD;  Location: [facility];  Service: General;  Laterality: Left;  left breast wire localization biopsy  . cataracts right eye    . CESAREAN SECTION  1986  . HERNIA REPAIR  2000   RIH  . opirectinal membrane peel    . RHINOPLASTY  1983    There were no vitals filed for this visit.      Subjective Assessment - 11/27/15 2226    Subjective Hired RN, Judy Spears, present for hands-on transfer training today.    Patient is accompained by: Family member  caregiver, [name]; husband, chip; and home health RN, Judy Spears   Pertinent  History Precautions:  PEG, seizure. 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 No/denies          Therapeutic Activities: PT explained and demonstrated use of hand-over-back technique, staggered foot placement, and A/P weight shift to decrease physical burden during level slide board transfer from w/c > mat table.  Pt required max-total A (suspect due to fatigue). Emphasized importance of lifting/lowering pt as opposed to shearing across slide board to preserve skin integrity. Noted difficulty placing/stabilizing slide board due to use of Roho cushion today; therefore, RN performed lateral scoot transfer without slide board from mat table > w/c with +2A of this PT. Pt performed the following x2 trials each (wearing B custom AFO's) with PT cueing during initial trial, RN cueing during subsequent trial: supine > sit with max A for BLE management and for transition from R side lying > sit; supine rolling to B directions with supervision, 50% cueing for cervical spine rotation, movement of UE across midline to initiate transition; and rolling from R SL >  supine and from L SL > supine with min A and 50% cueing (suspect increased assist required due to weight of custom AFO's). RN did require 50% cueing for effective return demo of cueing and to prevent RN over-assistance with bed mobility.                      PT Education - 11/27/15 2228    Education provided Yes   Education Details Explained, demonstrated to home health RN hand-over-back transfer technique, A/P weight shift for decreased physical burden during transfers, and cueing during bed mobility to maximize pt participation   Person(s) Educated  Patient;Spouse;Caregiver(s)   Methods Explanation;Demonstration;Tactile cues;Verbal cues   Comprehension Verbalized understanding;Returned demonstration;Verbal cues required;Tactile cues required;Need further instruction          PT Short Term Goals - 11/19/15 2029      PT SHORT TERM GOAL #1   Title Pt will consistently perform supine rolling to in B directions with supervision, 25% cueing to indicate increased trunk dissociation, increased independence with bed mobility.  (Target date:12/17/15)    Time 4   Period Weeks   Status New     PT SHORT TERM GOAL #2   Title Pt will consistently perform supine <> sit with min A, 25% cueing to indicate increased independence getting out of bed. (12/17/15)   Time 4   Period Weeks   Status New     PT SHORT TERM GOAL #3   Title Pt will demonstrate midline posture in static sitting with BLE support with 50% verbal cueing to indicate increased postural awareness/control.  (12/17/15)   Time 4   Period Weeks   Status New     PT SHORT TERM GOAL #4   Title Pt will consistently perform level transfers from w/c <> mat table with mod A, 50% cueing to decrease caregiver burden.  (12/17/15)   Time 4   Period Weeks   Status New     PT SHORT TERM GOAL #5   Title Pt will consistently perform sit <> stand from personal w/c with BUE support and mod A, 50% cueing to decrease caregiver burden.  (12/17/15)   Time 4   Period Weeks   Status New     Additional Short Term Goals   Additional Short Term Goals Yes     PT SHORT TERM GOAL #6   Title Pt will perform static standing with BUE support x1 minute with supervision, no overt LOB to decrease caregiver burden.  (12/17/15)   Time 4   Period Weeks   Status New     PT SHORT TERM GOAL #7   Title PT will assess current wheelchair and determine if alternative seating system would better maximize pt safety with functional mobility.  (12/17/15)   Time 4   Period Weeks   Status New           PT Long  Term Goals - 11/19/15 2034      PT LONG TERM GOAL #1   Title Pt will consistently perform supine <> sit with supervision, 25% cueing to indicate increased independence getting out of bed.   (Target: 01/14/16)   Time 8   Period Weeks   Status New     PT LONG TERM GOAL #2   Title Pt will demonstrate midline posture in static sitting with BLE support with subtle to min cueing to indicate increased postural awareness/control. (01/14/16)   Time 8   Period Weeks   Status New  PT LONG TERM GOAL #3   Title Pt will consistently perform level transfers from w/c <> mat table with min A, 25% cueing to decrease caregiver burden.  (01/14/16)   Time 8   Period Weeks   Status New     PT LONG TERM GOAL #4   Title Pt will consistently perform sit <> stand from personal w/c with BUE support and min A, 25% cueing to decrease caregiver burden.  (01/14/16)   Time 8   Period Weeks   Status New     PT LONG TERM GOAL #5   Title PT will assess gait to determine if ambulation is safe/functional.  (01/14/16)   Time [place]   Status New               Plan - 11/27/15 2232    Clinical Impression Statement Session focused on providing hands-on training to home health RN, Judy Spears, to decrease physical burden with transfers and to maximize patient participation in functional mobility in home environment. Pt awake/alert and participatory throughout session, but required increased assist during transfers due to fatigue.    Rehab Potential Fair   Clinical Impairments Affecting Rehab Potential Positive: strong family/social support; negative: chronicity of condition   PT Frequency 3x / week  followed by 2x/week for 4 weeks   PT Duration 4 weeks  followed by 2x/week for 4 weeks   PT Treatment/Interventions ADLs/Self Care Home Management;DME Instruction;Gait training;Stair training;Functional mobility training;Therapeutic activities;Patient/family education;Cognitive remediation;Neuromuscular  re-education;Therapeutic exercise;Balance training;Orthotic Fit/Training;Wheelchair mobility training;Manual techniques;Splinting;Visual/perceptual remediation/compensation;Vestibular;Passive range of motion   PT Next Visit Plan Continue to address bed mobility, slide board transfers, sitting postural awareness/self-correction.   Consulted and Agree with Plan of Care Patient;Family member/caregiver   Family Member Consulted caregiver, [name] and husband, [name]      Patient will benefit from skilled therapeutic intervention in order to improve the following deficits and impairments:  Abnormal gait, Decreased balance, Decreased endurance, Decreased cognition, Cardiopulmonary status limiting activity, Decreased activity tolerance, Decreased coordination, Decreased strength, Impaired flexibility, Impaired tone, Decreased mobility, Decreased skin integrity, Increased muscle spasms, Impaired vision/preception, Decreased range of motion, Impaired UE functional use, Postural dysfunction, Pain  Visit Diagnosis: Other abnormalities of gait and mobility  Abnormal posture  Other lack of coordination  Muscle weakness (generalized)     Problem List Patient Active Problem List   Diagnosis Date Noted  . Tracheostomy tube present ([place])   . Tracheal stenosis   . Chronic respiratory failure with hypoxia ([place])   . 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[place]) 10/20/2015  . Tracheostomy status ([place]) 09/26/2015  . Allergic rhinitis 09/26/2015  . 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 Movement disorder 09/22/2015  . Encephalitis 09/22/2015  . Chest pain 02/07/2014  . Premature atrial contractions 01/13/2014  . Abnormality of gait 12/04/2013  . Hyperlipidemia 12/21/2011  . Cardiovascular risk factor 12/21/2011  . Bunion 01/31/2008  . Metatarsalgia of both feet 09/20/2007  . FLAT FOOT 09/20/2007  . HALLUX RIGIDUS, ACQUIRED 09/20/2007   Billie Ruddy, PT, DPT [facility] [address] [place], [place], [address] Phone: [phone]   Fax:  [phone] 11/27/15, 10:43 PM  Name: JALAINE GUARINI MRN: [id number] Date of Birth: 11/14/1950

## 2015-11-27 NOTE — Therapy (Signed)
[facility] [address] [place], [place], [address] Phone: [phone]   Fax:  [phone]  Speech Language Pathology Treatment  Patient Details  Name: Judy Spears MRN: [id number] Date of Birth: 10-29-1950 Referring Provider: Dr. Alger Simons  Encounter Date: 11/27/2015      End of Session - 11/27/15 1706    Visit Number 27   Number of Visits 36   Date for SLP Re-Evaluation 01/02/16   SLP Start Time 1316   SLP Stop Time  1400   SLP Time Calculation (min) 44 min   Activity Tolerance Patient limited by lethargy      Past Medical History:  Diagnosis Date  . Arthritis    lt great toe  . Complication of anesthesia    pt has had headaches post op-did advise to hydra well preop  . Hair loss    right-sided  . History of exercise stress test    a. ETT (12/15) with 12:00 exercise, no ST changes, occasional PACs.   . Hyperlipidemia   . Insomnia   . Migraine headache   . 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 Postmenopausal   . Premature atrial contractions    a. Holter (12/15) with 8% PACs, no atrial runs or atrial fibrillation.     Past Surgical History:  Procedure Laterality Date  . BREAST BIOPSY  01/01/2011   Procedure: BREAST BIOPSY WITH NEEDLE LOCALIZATION;  Surgeon: Rolm Bookbinder, MD;  Location: [facility];  Service: General;  Laterality: Left;  left breast wire localization biopsy  . cataracts right eye    . CESAREAN SECTION  1986  . HERNIA REPAIR  2000   RIH  . opirectinal membrane peel    . RHINOPLASTY  1983    There were no vitals filed for this visit.      Subjective Assessment - 11/27/15 1317    Subjective [name] stated pt indicated slowly with cues "I want - to change - my shoes."   Patient is accompained by: Family member  [name], [name]   Currently in Pain? No/denies               ADULT SLP TREATMENT - 11/27/15 1319      General Information    Behavior/Cognition Alert;Cooperative;Pleasant mood     Treatment Provided   Treatment provided Cognitive-Linquistic;Dysphagia     Dysphagia Treatment   Treatment Methods Skilled observation;Therapeutic exercise;Patient/caregiver education  education re: basic swallowing exercises   Patient observed directly with PO's Yes   Type of PO's observed Ice chips   Feeding Total assist   Oral Phase Signs & Symptoms Anterior loss/spillage;Prolonged bolus formation   Pharyngeal Phase Signs & Symptoms Suspected delayed swallow initiation;Wet vocal quality   Type of cueing Verbal;Tactile;Visual   Amount of cueing Moderate   Other treatment/comments Pt with ice chips today, husband stated oral care was adequate, after inspection of pt's oral cavity, and did not need to be completed prior to POs. Swallow response 80% of the time on initial swallows, initial 3 swallows were markedly stronger than last 4-5. Pt completed 8 swallows and exhibited incr'd anterior leakage with later 5 reps than with first 3-4 reps. SLP cued pt for second swallows, and pt completed with up to 10 second delay. SLP educated caregiver/husband/pt [place], tongue press, effortful swallow, oral manipulation(swab side to side), and lingual isometrics (tongue to cheek with resistance from caregiver's finger on outside). Pt's lt lingual strength appears stronger than  rt with this exercise.      Cognitive-Linquistic Treatment   Treatment focused on Voice;Dysarthria   Skilled Treatment SLP targeted consistency in vocalization with simple sounds/sound combinations. SLP used tactile cues (pt's fingers to her thyroid area) for stimulating multiple cues for voicing. Pt with 75% voice in downward position with imitated vowels "ooo", "oh", "ah".  Imitated consosnat vowel words bee, moo, pie, boo with voice 75% of the time (50% functional volume). Pt stated those words in close phrases 60% of the time (25% functional volume). Spontaneously, pt  mouthed/said these simple words 60% of the time (<20% functional volume) Only 1/18 one syllable imitations were with audible voicing in sitting position.     Assessment / Recommendations / Plan   Plan Continue with current plan of care     Progression Toward Goals   Progression toward goals Progressing toward goals          SLP Education - 11/27/15 1705    Education provided Yes   Education Details swallow exercises, "wet" voice, need to clear/ocugh with "wet" voice   Person(s) Educated Patient;Spouse;Caregiver(s)   Methods Explanation;Demonstration;Verbal cues   Comprehension Verbalized understanding;Need further instruction;Returned demonstration;Verbal cues required;Tactile cues required          SLP Short Term Goals - 11/25/15 1227      SLP SHORT TERM GOAL #1   Title Pt will demonstrate audible phonation with words, vowels, or syllables in 4/10 trials with max A   Baseline achieved 10/30- will maintain to establish consistency   Status On-going     SLP SHORT TERM GOAL #3   Title Pt will swallow ice chip/H20 via spoon in less than 7 seconds with usual min to mod A 3/5 trials   Baseline 9/25, 10/30, 10/31   Status On-going     SLP SHORT TERM GOAL #4   Title Pt will sustain attention to therapy task for over 7 minutes with occasional min A   Status Achieved          SLP Long Term Goals - 11/27/15 1717      SLP LONG TERM GOAL #1   Title Pt will imitate one to two syllables with voicing in upright position 50% of the time.   Time 3   Period Weeks   Status On-going     SLP LONG TERM GOAL #2   Title pt will demonstrate adequate breath support for speech 25% of the time with imitative responses and max cues usually (50-80%)   Time 3   Period Weeks   Status On-going     SLP LONG TERM GOAL #3   Title after oral care, pt will achieve initiation of oral phase of the swallow with ice chip/water trials with average <6 seconds over 4 sessions   Time 3   Period Weeks    Status On-going     SLP LONG TERM GOAL #4   Title --   Time --   Status --          Plan - 11/27/15 1706    Clinical Impression Statement Pt with improved swallow to command and improved timeliness of dry or double swallow to command. Labial leakage occurred with ice chips on last few trials. Quiet but audible phonation achieved with mod-max A consistently. Pt would cont to benefit from skilled ST for improving voice consistency, swallow safety/readiness for modified or objective swallow assessment, and other cognitive-linguistic abilities.   Speech Therapy Frequency 3x / week   Treatment/Interventions Aspiration precaution  training;Trials of upgraded texture/liquids;Compensatory strategies;Functional tasks;Patient/family education;Cueing hierarchy;Diet toleration management by SLP;Cognitive reorganization;Compensatory techniques;SLP instruction and feedback;Internal/external aids;Multimodal communcation approach   Potential to Achieve Goals Fair   Potential Considerations Ability to learn/carryover information;Co-morbidities;Severity of impairments   Consulted and Agree with Plan of Care Family member/caregiver   Family Member Consulted caregiver [name]      Patient will benefit from skilled therapeutic intervention in order to improve the following deficits and impairments:   Dysphagia, oropharyngeal phase  Dysarthria and anarthria  Cognitive communication deficit    Problem List Patient Active Problem List   Diagnosis Date Noted  . Tracheostomy tube present ([place])   . Tracheal stenosis   . Chronic respiratory failure with hypoxia ([place])   . 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[place]) 10/20/2015  . Tracheostomy status ([place]) 09/26/2015  . Allergic rhinitis 09/26/2015  . 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 Movement disorder 09/22/2015  . Encephalitis 09/22/2015  . Chest pain 02/07/2014  . Premature atrial contractions 01/13/2014  . Abnormality of gait 12/04/2013  . Hyperlipidemia 12/21/2011   . Cardiovascular risk factor 12/21/2011  . Bunion 01/31/2008  . Metatarsalgia of both feet 09/20/2007  . FLAT FOOT 09/20/2007  . HALLUX RIGIDUS, ACQUIRED 09/20/2007    [facility] ,MS, CCC-SLP  11/27/2015, 5:19 PM  [facility] [address] [place], [place], [address] Phone: [phone]   Fax:  [phone]   Name: VANTASIA SIGNS MRN: [id number] Date of Birth: 23-May-1950

## 2015-11-27 NOTE — Therapy (Signed)
[facility] [address] [place], [place], [address] Phone: [phone]   Fax:  [phone]  Occupational Therapy Treatment  Patient Details  Name: Judy Spears MRN: [id number] Date of Birth: 06/15/1950 Referring Provider: Dr. Sharion Dove  Encounter Date: 11/27/2015      OT End of Session - 11/27/15 1714    Visit Number 27   Number of Visits 77   Date for OT Re-Evaluation 01/25/16   Authorization Type BC[employer] unlimited visits   OT Start Time 1017   OT Stop Time 1101  family conference from 1205-1235 with pt present   OT Time Calculation (min) 44 min      Past Medical History:  Diagnosis Date  . Arthritis    lt great toe  . Complication of anesthesia    pt has had headaches post op-did advise to hydra well preop  . Hair loss    right-sided  . History of exercise stress test    a. ETT (12/15) with 12:00 exercise, no ST changes, occasional PACs.   . Hyperlipidemia   . Insomnia   . Migraine headache   . 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 Postmenopausal   . Premature atrial contractions    a. Holter (12/15) with 8% PACs, no atrial runs or atrial fibrillation.     Past Surgical History:  Procedure Laterality Date  . BREAST BIOPSY  01/01/2011   Procedure: BREAST BIOPSY WITH NEEDLE LOCALIZATION;  Surgeon: Rolm Bookbinder, MD;  Location: [facility];  Service: General;  Laterality: Left;  left breast wire localization biopsy  . cataracts right eye    . CESAREAN SECTION  1986  . HERNIA REPAIR  2000   RIH  . opirectinal membrane peel    . RHINOPLASTY  1983    There were no vitals filed for this visit.      Subjective Assessment - 11/27/15 1025    Subjective  Pt cried during family conference when discussing goals.   Pertinent History see epic snapshot - ABI/hypoxia from tic bite/ virus   Patient Stated Goals Pt unable to state.    Currently in Pain? No/denies                       OT Treatments/Exercises (OP) - 11/27/15 0001      ADLs   Functional Mobility Home care nursing supervisor present today - demonstrated sliding board transfers, techniques for helping pt to come forward, put weight on feet, scoot, intiate with LE to lift bottom during transfers, and tone reduction techniques.  Nursing supervisor able to verbalize understanding.  Also addressed simulated bathing - pt requires only supervision for sitting balance on firm surface and mod vc's to perform approxiately 50% of all bathing.  Disussed importance of allowing pt time to actively engage in activities and also discussed positioning to decrease tone and perceptual awareness and allowing pt intiate movement within the context of functioal activity.    ADL Comments Family conference held today with entire treatment team, pt, pt's husband [name] and primary caregiver, [name]. Discussed renewal for next 8 weeks, frequency, goals and beginning to look at after care programming for pt.  Pt and husband very emotional during conference and tearful however verbalized understanding. Pt's husband stated he feels he can approach any team member with questions and asked appropriate questions during the conference.  All team members to share goals in writing to pt and husband.  OT Short Term Goals - 11/27/15 1712      OT SHORT TERM GOAL #1   Title Pt and husband will be mod I with HEP - 12/18/2015   Status On-going     OT SHORT TERM GOAL #2   Title Pt will demonstrate adequate postural control for breath support to voice at least 2 words   Status Achieved     OT SHORT TERM GOAL #3   Title Pt will transfer to commode with moderate assistance 25% of the time.   Status On-going     OT SHORT TERM GOAL #4   Title Pt will tolerate positional changes as evidenced by respiration rate and motor quieting in preparation for funcitonal mobility   Status Achieved            OT Long Term Goals - 11/27/15 1712      OT LONG TERM GOAL #1   Title Pt and husband will be mod I with upgraded HEP - 01/15/2016   Status On-going     OT LONG TERM GOAL #2   Title Pt will demonstrate adequate postural control to support 3 words in supported sitting.   Status On-going     OT LONG TERM GOAL #3   Title Pt will demonstrate ability for low reach with RUE for functional task with  mod facilitation   Status Achieved     OT LONG TERM GOAL #4   Title Pt will wash face with LUE with min a in supported sitting   Status Achieved     OT LONG TERM GOAL #5   Title Pt will release objects with L hand with min faciltation    Status Achieved     OT LONG TERM GOAL #6   Title Pt will stand with min a while caregiver manages pants during LB dressing 75% of the time.   Status On-going     OT LONG TERM GOAL #7   Title Pt will transfer to commode with mod a 50% of the time.   Status On-going     OT LONG TERM GOAL #8   Title Pt will be able to sit unsupported with supervision 50% of the time while engaged in functional task.   Status On-going               Plan - 11/27/15 1712    Clinical Impression Statement Pt with slow progression toward goals. Family conference today - see note for details.    Rehab Potential Fair   Clinical Impairments Affecting Rehab Potential severity of deficits, slow rate of recovery   OT Frequency Other (comment)   OT Duration 8 weeks   OT Treatment/Interventions Self-care/ADL training;Ultrasound;Moist Heat;Electrical Stimulation;DME and/or AE instruction;Neuromuscular education;Therapeutic exercise;Functional Mobility Training;Manual Therapy;Passive range of motion;Splinting;Therapeutic activities;Balance training;Patient/family education;Visual/perceptual remediation/compensation;Cognitive remediation/compensation   Plan sit to squat, sit to stand, standing, sitting balance, functional use of BUE's, forward weight shift, engagement in  functional tasks.   Consulted and Agree with Plan of Care Patient;Family member/caregiver   Family Member Consulted caregiver [name]      Patient will benefit from skilled therapeutic intervention in order to improve the following deficits and impairments:  Decreased endurance, Decreased skin integrity, Impaired vision/preception, Improper body mechanics, Impaired perceived functional ability, Decreased knowledge of precautions, Cardiopulmonary status limiting activity, Decreased activity tolerance, Decreased knowledge of use of DME, Decreased strength, Impaired flexibility, Improper spinal/pelvic alignment, Impaired sensation, Difficulty walking, Decreased mobility, Decreased balance, Decreased cognition, Decreased range of motion, Impaired tone, Pain, Impaired  UE functional use, Decreased safety awareness, Decreased coordination  Visit Diagnosis: Abnormal posture  Muscle weakness (generalized)  Unsteadiness on feet  Stiffness of left shoulder, not elsewhere classified  Stiffness of right shoulder, not elsewhere classified  Frontal lobe and executive function deficit  Other lack of coordination    Problem List Patient Active Problem List   Diagnosis Date Noted  . Tracheostomy tube present ([facility])   . Tracheal stenosis   . Chronic respiratory failure with hypoxia ([facility])   . 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[facility]) 10/20/2015  . Tracheostomy status ([facility]) 09/26/2015  . Allergic rhinitis 09/26/2015  . 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 Movement disorder 09/22/2015  . Encephalitis 09/22/2015  . Chest pain 02/07/2014  . Premature atrial contractions 01/13/2014  . Abnormality of gait 12/04/2013  . Hyperlipidemia 12/21/2011  . Cardiovascular risk factor 12/21/2011  . Bunion 01/31/2008  . Metatarsalgia of both feet 09/20/2007  . FLAT FOOT 09/20/2007  . HALLUX RIGIDUS, ACQUIRED 09/20/2007    Quay Burow, OTR/L 11/27/2015, 5:16 PM  [facility] [address] [place], [place], [address] Phone: [phone]   Fax:  [phone]  Name: Judy Spears MRN: [id number] Date of Birth: [birth year]

## 2015-11-27 NOTE — Patient Instructions (Addendum)
Attempt swallow exercises 3-5 reps, 4-5 times a day.  Tongue bite swallow  Effortful swallow  Tongue to cheek with resistance of finger exteriorly  Move swab from side to side  Tongue press to roof  Pay attention to "wet" voice. Attempt to have [name] clear her throat or ocugh when you hear this.

## 2015-12-02 ENCOUNTER — Ambulatory Visit: Payer: BC Managed Care – PPO | Admitting: Speech Pathology

## 2015-12-02 ENCOUNTER — Other Ambulatory Visit: Payer: Self-pay | Admitting: *Deleted

## 2015-12-02 ENCOUNTER — Ambulatory Visit: Payer: BC Managed Care – PPO | Admitting: Occupational Therapy

## 2015-12-02 ENCOUNTER — Encounter: Payer: Self-pay | Admitting: Occupational Therapy

## 2015-12-02 ENCOUNTER — Ambulatory Visit: Payer: BC Managed Care – PPO | Admitting: Physical Therapy

## 2015-12-02 DIAGNOSIS — R293 Abnormal posture: Secondary | ICD-10-CM

## 2015-12-02 DIAGNOSIS — R2681 Unsteadiness on feet: Secondary | ICD-10-CM

## 2015-12-02 DIAGNOSIS — M6281 Muscle weakness (generalized): Secondary | ICD-10-CM

## 2015-12-02 DIAGNOSIS — R471 Dysarthria and anarthria: Secondary | ICD-10-CM

## 2015-12-02 DIAGNOSIS — R2689 Other abnormalities of gait and mobility: Secondary | ICD-10-CM

## 2015-12-02 DIAGNOSIS — R1312 Dysphagia, oropharyngeal phase: Secondary | ICD-10-CM

## 2015-12-02 DIAGNOSIS — M25611 Stiffness of right shoulder, not elsewhere classified: Secondary | ICD-10-CM

## 2015-12-02 DIAGNOSIS — R278 Other lack of coordination: Secondary | ICD-10-CM

## 2015-12-02 DIAGNOSIS — R41844 Frontal lobe and executive function deficit: Secondary | ICD-10-CM

## 2015-12-02 DIAGNOSIS — M25612 Stiffness of left shoulder, not elsewhere classified: Secondary | ICD-10-CM

## 2015-12-02 DIAGNOSIS — M25622 Stiffness of left elbow, not elsewhere classified: Secondary | ICD-10-CM

## 2015-12-02 MED ORDER — NYSTATIN POWD: 0 refills | Status: DC

## 2015-12-02 NOTE — Therapy (Signed)
[facility] [address] [place], [place], [address] Phone: [phone]   Fax:  [phone]  Speech Language Pathology Treatment  Patient Details  Name: Judy Spears MRN: [id number] Date of Birth: 07/27/1950 Referring Provider: Dr. Alger Simons  Encounter Date: 12/02/2015      End of Session - 12/02/15 1225    Visit Number 28   Number of Visits 36   Date for SLP Re-Evaluation 01/02/16   SLP Start Time [id number]   SLP Stop Time  1101   SLP Time Calculation (min) 43 min      Past Medical History:  Diagnosis Date  . Arthritis    lt great toe  . Complication of anesthesia    pt has had headaches post op-did advise to hydra well preop  . Hair loss    right-sided  . History of exercise stress test    a. ETT (12/15) with 12:00 exercise, no ST changes, occasional PACs.   . Hyperlipidemia   . Insomnia   . Migraine headache   . 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 Postmenopausal   . Premature atrial contractions    a. Holter (12/15) with 8% PACs, no atrial runs or atrial fibrillation.     Past Surgical History:  Procedure Laterality Date  . BREAST BIOPSY  01/01/2011   Procedure: BREAST BIOPSY WITH NEEDLE LOCALIZATION;  Surgeon: Rolm Bookbinder, MD;  Location: [facility];  Service: General;  Laterality: Left;  left breast wire localization biopsy  . cataracts right eye    . CESAREAN SECTION  1986  . HERNIA REPAIR  2000   RIH  . opirectinal membrane peel    . RHINOPLASTY  1983    There were no vitals filed for this visit.      Subjective Assessment - 12/02/15 1215    Subjective Pt sang at [facility] per caregiver [name]   Patient is accompained by: Family member   Special Tests spouse, [name]; caregiver   Currently in Pain? No/denies               ADULT SLP TREATMENT - 12/02/15 1216      General Information   Behavior/Cognition Alert;Cooperative;Pleasant mood     Treatment Provided   Treatment provided Cognitive-Linquistic     Dysphagia Treatment   Treatment Methods Skilled observation;Therapeutic exercise;Patient/caregiver education   Patient observed directly with PO's Yes   Type of PO's observed Ice chips   Feeding Needs assist   Oral Phase Signs & Symptoms Anterior loss/spillage;Prolonged bolus formation   Pharyngeal Phase Signs & Symptoms Suspected delayed swallow initiation;Wet vocal quality   Type of cueing Verbal;Tactile;Visual   Amount of cueing Moderate   Other treatment/comments Encouraged mastication and hard swallow with ice chips - timliness of swallow is variable. Labial leakage present with mod A to keep lips closed and perform dry swallows to clear oral cavity. Dry swallows with delay and max A. Pt performed lingual exercises with mod A 2-3x each     Cognitive-Linquistic Treatment   Treatment focused on Voice;Dysarthria   Skilled Treatment Consistency of phonation targeted with simple 1 word vocalic words/sounds with 50% phonation with max tactile, verbal A for breath support, posture and volume. Focused on using slow rate and over articulation in 1-2 word utterances to improve intellgiblity with and without phonation. Pt responded well with max A for slow rate, using syllables with imitation. Added labial exercises to pharyngeal and lingual exercises.  Assessment / Recommendations / Plan   Plan Continue with current plan of care     Progression Toward Goals   Progression toward goals Progressing toward goals          SLP Education - 12/02/15 1223    Education provided Yes   Education Details Slow rate, over articulation and use of 1-2 word utterances to improve intellgiblity   Person(s) Educated Patient;Spouse;Caregiver(s)   Methods Explanation;Demonstration;Tactile cues;Verbal cues   Comprehension Verbalized understanding;Returned demonstration;Need further instruction          SLP Short Term Goals - 12/02/15 1225       SLP SHORT TERM GOAL #1   Title Pt will demonstrate audible phonation with words, vowels, or syllables in 4/10 trials with max A   Baseline achieved 10/30- will maintain to establish consistency   Status On-going     SLP SHORT TERM GOAL #3   Title Pt will swallow ice chip/H20 via spoon in less than 7 seconds with usual min to mod A 3/5 trials   Baseline 9/25, 10/30, 10/31   Status On-going     SLP SHORT TERM GOAL #4   Title Pt will sustain attention to therapy task for over 7 minutes with occasional min A   Status Achieved          SLP Long Term Goals - 12/02/15 1225      SLP LONG TERM GOAL #1   Title Pt will imitate one to two syllables with voicing in upright position 50% of the time.   Time 2   Period Weeks   Status On-going     SLP LONG TERM GOAL #2   Title pt will demonstrate adequate breath support for speech 25% of the time with imitative responses and max cues usually (50-80%)   Time 2   Period Weeks   Status On-going     SLP LONG TERM GOAL #3   Title after oral care, pt will achieve initiation of oral phase of the swallow with ice chip/water trials with average <6 seconds over 4 sessions   Time 2   Period Weeks   Status On-going          Plan - 12/02/15 1224    Clinical Impression Statement Pt with improved swallow to command and improved timeliness of dry or double swallow to command. Labial leakage occurred with ice chips on last few trials. Quiet but audible phonation achieved with mod-max A consistently. Pt would cont to benefit from skilled ST for improving voice consistency, swallow safety/readiness for modified or objective swallow assessment, and other cognitive-linguistic abilities.   Speech Therapy Frequency 3x / week   Treatment/Interventions Aspiration precaution training;Trials of upgraded texture/liquids;Compensatory strategies;Functional tasks;Patient/family education;Cueing hierarchy;Diet toleration management by SLP;Cognitive  reorganization;Compensatory techniques;SLP instruction and feedback;Internal/external aids;Multimodal communcation approach   Potential to Achieve Goals Fair   Potential Considerations Ability to learn/carryover information;Co-morbidities;Severity of impairments   Consulted and Agree with Plan of Care Family member/caregiver   Family Member Consulted caregiver [name]      Patient will benefit from skilled therapeutic intervention in order to improve the following deficits and impairments:   Dysarthria and anarthria  Dysphagia, oropharyngeal phase    Problem List Patient Active Problem List   Diagnosis Date Noted  . Tracheostomy tube present ([facility])   . Tracheal stenosis   . Chronic respiratory failure with hypoxia ([facility])   . 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[facility]) 10/20/2015  . Tracheostomy status ([facility]) 09/26/2015  . Allergic rhinitis 09/26/2015  . Viral encephalitis  09/22/2015  . Movement disorder 09/22/2015  . Encephalitis 09/22/2015  . Chest pain 02/07/2014  . Premature atrial contractions 01/13/2014  . Abnormality of gait 12/04/2013  . Hyperlipidemia 12/21/2011  . Cardiovascular risk factor 12/21/2011  . Bunion 01/31/2008  . Metatarsalgia of both feet 09/20/2007  . FLAT FOOT 09/20/2007  . HALLUX RIGIDUS, ACQUIRED 09/20/2007    Lovvorn, Annye Rusk MS, CCC-SLP 12/02/2015, 12:26 PM  [facility] [address] [place], [place], [address] Phone: [phone]   Fax:  [phone]   Name: Judy Spears MRN: [id number] Date of Birth: Jun 21, 1950

## 2015-12-02 NOTE — Therapy (Signed)
[facility] [address] [place], [place], [address] Phone: [phone]   Fax:  [phone]  Occupational Therapy Treatment  Patient Details  Name: Judy Spears MRN: [id number] Date of Birth: 1950/09/04 Referring Provider: Dr. Sharion Dove  Encounter Date: 12/02/2015      OT End of Session - 12/02/15 1037    Visit Number 28   Number of Visits 78   Date for OT Re-Evaluation 01/25/16   Authorization Type BCBS unlimited visits   OT Start Time 0940   OT Stop Time 1015   OT Time Calculation (min) 35 min   Activity Tolerance Patient tolerated treatment well   Behavior During Therapy [facility] for tasks assessed/performed      Past Medical History:  Diagnosis Date  . Arthritis    lt great toe  . Complication of anesthesia    pt has had headaches post op-did advise to hydra well preop  . Hair loss    right-sided  . History of exercise stress test    a. ETT (12/15) with 12:00 exercise, no ST changes, occasional PACs.   . Hyperlipidemia   . Insomnia   . Migraine headache   . 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 Postmenopausal   . Premature atrial contractions    a. Holter (12/15) with 8% PACs, no atrial runs or atrial fibrillation.     Past Surgical History:  Procedure Laterality Date  . BREAST BIOPSY  01/01/2011   Procedure: BREAST BIOPSY WITH NEEDLE LOCALIZATION;  Surgeon: Rolm Bookbinder, MD;  Location: [facility];  Service: General;  Laterality: Left;  left breast wire localization biopsy  . cataracts right eye    . CESAREAN SECTION  1986  . HERNIA REPAIR  2000   RIH  . opirectinal membrane peel    . RHINOPLASTY  1983    There were no vitals filed for this visit.      Subjective Assessment - 12/02/15 1030    Subjective  "I'm good"   Pertinent History see epic snapshot - ABI/hypoxia from tic bite/ virus   Patient Stated Goals Pt unable to state.    Currently in Pain? No/denies   Pain Score 0-No pain                      OT Treatments/Exercises (OP) - 12/02/15 0001      Neurological Re-education Exercises   Other Exercises 1 Worked on components of active slide board transfer with emphasis on postural alignment and control for this transition.  Patient able to move pelvis from extreme posterior rotation toward more neurtral position with resultant midline thoracic extension.  Patient needed mod cueing to stay in midline - to correct trunk shortening on left.  Patient able to activate legs for push from floor but unable to produce sufficient force to be successful without mod assist.  Worked on active control in trunk for improved unsupported sitting balance, and reciprocal scooting.  Patient able to assist with transfer to standing with sit to stand lift and min assist x1, subsequent transitions required mod to max assist.  Patient once in partial standing able to lift up off surface - with only min assist to complete near full standing x 1.  Subsequent partial stand to stands required mod assist.  Patient's husband and paid caregiver present for lift transfer.  Reviewed all short and long term OT goals, and provided written copy of goals for patient  and family.                  OT Education - 12/02/15 1036    Education provided Yes   Education Details sit to stand lift, and sit to partial stand exercise to aide with ADL's at home, reviewed all remaining OT goals.   Person(s) Educated Patient;Spouse;Caregiver(s)   Methods Explanation;Demonstration;Tactile cues;Verbal cues   Comprehension Verbalized understanding;Returned demonstration          OT Short Term Goals - 11/27/15 1712      OT SHORT TERM GOAL #1   Title Pt and husband will be mod I with HEP - 12/18/2015   Status On-going     OT SHORT TERM GOAL #2   Title Pt will demonstrate adequate postural control for breath support to voice at least 2 words   Status Achieved     OT SHORT TERM  GOAL #3   Title Pt will transfer to commode with moderate assistance 25% of the time.   Status On-going     OT SHORT TERM GOAL #4   Title Pt will tolerate positional changes as evidenced by respiration rate and motor quieting in preparation for funcitonal mobility   Status Achieved           OT Long Term Goals - 11/27/15 1712      OT LONG TERM GOAL #1   Title Pt and husband will be mod I with upgraded HEP - 01/15/2016   Status On-going     OT LONG TERM GOAL #2   Title Pt will demonstrate adequate postural control to support 3 words in supported sitting.   Status On-going     OT LONG TERM GOAL #3   Title Pt will demonstrate ability for low reach with RUE for functional task with  mod facilitation   Status Achieved     OT LONG TERM GOAL #4   Title Pt will wash face with LUE with min a in supported sitting   Status Achieved     OT LONG TERM GOAL #5   Title Pt will release objects with L hand with min faciltation    Status Achieved     OT LONG TERM GOAL #6   Title Pt will stand with min a while caregiver manages pants during LB dressing 75% of the time.   Status On-going     OT LONG TERM GOAL #7   Title Pt will transfer to commode with mod a 50% of the time.   Status On-going     OT LONG TERM GOAL #8   Title Pt will be able to sit unsupported with supervision 50% of the time while engaged in functional task.   Status On-going               Plan - 12/02/15 1038    Clinical Impression Statement Patient progress limited by fluctuation in physical performance.  Focus of therapy to decrease caregeiver burden, and increase participation with ADL/Functional mobility   Rehab Potential Fair   Clinical Impairments Affecting Rehab Potential severity of deficits, slow rate of recovery   OT Frequency 3x / week   OT Duration 8 weeks  3x/4 weeks, then 2x/4weeks   OT Treatment/Interventions Self-care/ADL training;Ultrasound;Moist Heat;Electrical Stimulation;DME and/or AE  instruction;Neuromuscular education;Therapeutic exercise;Functional Mobility Training;Manual Therapy;Passive range of motion;Splinting;Therapeutic activities;Balance training;Patient/family education;Visual/perceptual remediation/compensation;Cognitive remediation/compensation   Plan sit to squat, partial stand to stand, sitting balance, sit to stand, functional use of BUE's   Consulted and Agree with  Plan of Care Patient;Family member/caregiver   Family Member Consulted caregiver [name], husb [name]      Patient will benefit from skilled therapeutic intervention in order to improve the following deficits and impairments:  Decreased endurance, Decreased skin integrity, Impaired vision/preception, Improper body mechanics, Impaired perceived functional ability, Decreased knowledge of precautions, Cardiopulmonary status limiting activity, Decreased activity tolerance, Decreased knowledge of use of DME, Decreased strength, Impaired flexibility, Improper spinal/pelvic alignment, Impaired sensation, Difficulty walking, Decreased mobility, Decreased balance, Decreased cognition, Decreased range of motion, Impaired tone, Pain, Impaired UE functional use, Decreased safety awareness, Decreased coordination  Visit Diagnosis: Abnormal posture  Muscle weakness (generalized)  Unsteadiness on feet  Stiffness of left shoulder, not elsewhere classified  Stiffness of right shoulder, not elsewhere classified  Frontal lobe and executive function deficit  Other lack of coordination  Stiffness of left elbow, not elsewhere classified    Problem List Patient Active Problem List   Diagnosis Date Noted  . Tracheostomy tube present ([facility])   . Tracheal stenosis   . Chronic respiratory failure with hypoxia ([facility])   . 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[facility]) 10/20/2015  . Tracheostomy status ([facility]) 09/26/2015  . Allergic rhinitis 09/26/2015  . 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 Movement disorder 09/22/2015  . Encephalitis  09/22/2015  . Chest pain 02/07/2014  . Premature atrial contractions 01/13/2014  . Abnormality of gait 12/04/2013  . Hyperlipidemia 12/21/2011  . Cardiovascular risk factor 12/21/2011  . Bunion 01/31/2008  . Metatarsalgia of both feet 09/20/2007  . FLAT FOOT 09/20/2007  . HALLUX RIGIDUS, ACQUIRED 09/20/2007    Mariah Milling, OTR/L 12/02/2015, 10:41 AM  [facility] [address] [place], [place], [address] Phone: [phone]   Fax:  [phone]  Name: MERCY CAMPUSANO MRN: [id number] Date of Birth: 1950/04/20

## 2015-12-02 NOTE — Therapy (Signed)
[name] [address] [name], [place], [address] Phone: [phone]   Fax:  [phone]  Physical Therapy Treatment  Patient Details  Name: Judy Spears MRN: [id number] Date of Birth: 1950-04-29 Referring Provider: Alger Simons, MD  Encounter Date: 12/02/2015      PT End of Session - 12/02/15 1542    Visit Number 29   Number of Visits 45   Date for PT Re-Evaluation 01/18/16   Authorization Type BCBS   PT Start Time 1152   PT Stop Time 1238   PT Time Calculation (min) 46 min   Equipment Utilized During Treatment Other (comment)  Stedy   Activity Tolerance Patient tolerated treatment well   Behavior During Therapy [facility] for tasks assessed/performed      Past Medical History:  Diagnosis Date  . Arthritis    lt great toe  . Complication of anesthesia    pt has had headaches post op-did advise to hydra well preop  . Hair loss    right-sided  . History of exercise stress test    a. ETT (12/15) with 12:00 exercise, no ST changes, occasional PACs.   . Hyperlipidemia   . Insomnia   . Migraine headache   . 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 Postmenopausal   . Premature atrial contractions    a. Holter (12/15) with 8% PACs, no atrial runs or atrial fibrillation.     Past Surgical History:  Procedure Laterality Date  . BREAST BIOPSY  01/01/2011   Procedure: BREAST BIOPSY WITH NEEDLE LOCALIZATION;  Surgeon: Rolm Bookbinder, MD;  Location: [place];  Service: General;  Laterality: Left;  left breast wire localization biopsy  . cataracts right eye    . CESAREAN SECTION  1986  . HERNIA REPAIR  2000   RIH  . opirectinal membrane peel    . RHINOPLASTY  1983    There were no vitals filed for this visit.      Subjective Assessment - 12/02/15 1537    Subjective Per husband, [name], "We tried the [place] with [name] today. It worked well." Per [name], "We like this brand better because  the additional bar allows [name] to lean forward more; and the swivel seat panels help, too."   Patient is accompained by: Family member  husbamd, [name], and caregiver, [name]   Pertinent History Precautions:  PEG, seizure. 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 No/denies       Reviewed proper utilization of Stedy for transfers; husband, [name], demonstrated effective carryover (education initiated during OT session today) of lift placement, locking/unlocking brakes, and management of swivel seat panels. Pt initially required mod A to stand from personal w/c seat using Stedy; required min A during final sit > stand from mat table (high-low mat in lowest position). Least assistance required when pt places R hand on furthest of 2 anterior bars, L hand on junction between closest and furthest of 2 bars (due to L elbow flexor contracture), then transitions L hand to further bar upon standing. Pt requires 50 - 75% cueing and increased time to perform full anterior weight shift (initiates first 25 of weight shift without cueing/assist), for LE activation;  manual placement of LE's required to maintain LE position (due to pt tendency toward narrow BOS, BLE ADD/IR). When seated in [place], pt demonstrates midline posture and comfort. PT explained and demonstrated use of sheet behind B hips to promote increased B hip extension in standing with effective return demo by husband during standing trials x4 minutes, then x5 minutes. Husband also utilized sheet to slowly raise/lower pt from standing in [place] <>mat table. Pt remained in partial standing/WB in [place] for >80% of this session with no overt pain/distress noted. However, did note  increased LLE flexor tone during final 10 minutes of session. No AFO's utilized during this session.  Ankles were stable with use of Stedy; however, AFO on LLE may better manage tone and increase LLE contact with floor plate of Stedy.                          PT Education - 12/02/15 1539    Education provided Yes   Education Details Uses for Stedy: transfers, squat <> stand, transporting pt short-distances (if pt awake/alert) in WB, and safe practice of standing with sheet behind B hips.   Person(s) Educated Patient;Spouse;Caregiver(s)   Methods Explanation;Demonstration;Tactile cues;Verbal cues   Comprehension Verbalized understanding;Returned demonstration          PT Short Term Goals - 11/19/15 2029      PT SHORT TERM GOAL #1   Title Pt will consistently perform supine rolling to in B directions with supervision, 25% cueing to indicate increased trunk dissociation, increased independence with bed mobility.  (Target date:12/17/15)    Time 4   Period Weeks   Status New     PT SHORT TERM GOAL #2   Title Pt will consistently perform supine <> sit with min A, 25% cueing to indicate increased independence getting out of bed. (12/17/15)   Time 4   Period Weeks   Status New     PT SHORT TERM GOAL #3   Title Pt will demonstrate midline posture in static sitting with BLE support with 50% verbal cueing to indicate increased postural awareness/control.  (12/17/15)   Time 4   Period Weeks   Status New     PT SHORT TERM GOAL #4   Title Pt will consistently perform level transfers from w/c <> mat table with mod A, 50% cueing to decrease caregiver burden.  (12/17/15)   Time 4   Period Weeks   Status New     PT SHORT TERM GOAL #5   Title Pt will consistently perform sit <> stand from personal w/c with BUE support and mod A, 50% cueing to decrease caregiver burden.  (12/17/15)   Time 4   Period Weeks   Status New     Additional Short Term Goals   Additional Short  Term Goals Yes     PT SHORT TERM GOAL #6   Title Pt will perform static standing with BUE support x1 minute with supervision, no overt LOB to decrease caregiver burden.  (12/17/15)   Time 4   Period Weeks   Status New     PT SHORT TERM GOAL #7   Title PT will assess current wheelchair and determine if alternative seating system would better maximize pt safety with functional mobility.  (12/17/15)   Time 4   Period Weeks   Status New           PT Long Term Goals - 11/19/15 2034  PT LONG TERM GOAL #1   Title Pt will consistently perform supine <> sit with supervision, 25% cueing to indicate increased independence getting out of bed.   (Target: 01/14/16)   Time 8   Period Weeks   Status New     PT LONG TERM GOAL #2   Title Pt will demonstrate midline posture in static sitting with BLE support with subtle to min cueing to indicate increased postural awareness/control. (01/14/16)   Time 8   Period Weeks   Status New     PT LONG TERM GOAL #3   Title Pt will consistently perform level transfers from w/c <> mat table with min A, 25% cueing to decrease caregiver burden.  (01/14/16)   Time 8   Period Weeks   Status New     PT LONG TERM GOAL #4   Title Pt will consistently perform sit <> stand from personal w/c with BUE support and min A, 25% cueing to decrease caregiver burden.  (01/14/16)   Time 8   Period Weeks   Status New     PT LONG TERM GOAL #5   Title PT will assess gait to determine if ambulation is safe/functional.  (01/14/16)   Time 8   Period Weeks   Status New               Plan - 12/02/15 1551    Clinical Impression Statement Session focused on educating caregiver, husband on use of Stedy fo safe functional mobility and therapeutically to practice safe standing at home. Patient, caregiver, and husband all pleased with Charlaine Dalton and husband plans to purchase personal Stedy for home use.   Rehab Potential Fair   Clinical Impairments Affecting Rehab  Potential Positive: strong family/social support; negative: chronicity of condition   PT Frequency 3x / week  followed by 2x/week for 4 weeks   PT Duration 4 weeks  followed by 2x/week for 4 weeks   PT Treatment/Interventions ADLs/Self Care Home Management;DME Instruction;Gait training;Stair training;Functional mobility training;Therapeutic activities;Patient/family education;Cognitive remediation;Neuromuscular re-education;Therapeutic exercise;Balance training;Orthotic Fit/Training;Wheelchair mobility training;Manual techniques;Splinting;Visual/perceptual remediation/compensation;Vestibular;Passive range of motion   PT Next Visit Plan Continue to address bed mobility; Stedy for anterior weight shift, LE activaiton, and final 50% of B hip/knee extension in standing; postural awareness/self-correction.   Consulted and Agree with Plan of Care Patient;Family member/caregiver   Family Member Consulted caregiver, [name] and husband, [name]      Patient will benefit from skilled therapeutic intervention in order to improve the following deficits and impairments:  Abnormal gait, Decreased balance, Decreased endurance, Decreased cognition, Cardiopulmonary status limiting activity, Decreased activity tolerance, Decreased coordination, Decreased strength, Impaired flexibility, Impaired tone, Decreased mobility, Decreased skin integrity, Increased muscle spasms, Impaired vision/preception, Decreased range of motion, Impaired UE functional use, Postural dysfunction, Pain  Visit Diagnosis: Abnormal posture  Muscle weakness (generalized)  Unsteadiness on feet  Other lack of coordination  Other abnormalities of gait and mobility     Problem List Patient Active Problem List   Diagnosis Date Noted  . Tracheostomy tube present ([place])   . Tracheal stenosis   . Chronic respiratory failure with hypoxia ([place])   . 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 Tracheostomy status ([place]) 09/26/2015  . Allergic  rhinitis 09/26/2015  . 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 Movement disorder 09/22/2015  . Encephalitis 09/22/2015  . Chest pain 02/07/2014  . Premature atrial contractions 01/13/2014  . Abnormality of gait 12/04/2013  . Hyperlipidemia 12/21/2011  . Cardiovascular risk factor 12/21/2011  . Bunion 01/31/2008  .  Metatarsalgia of both feet 09/20/2007  . FLAT FOOT 09/20/2007  . HALLUX RIGIDUS, ACQUIRED 09/20/2007    Billie Ruddy, PT, DPT [facility] [address] [place], [place], [address] Phone: [phone]   Fax:  [phone] 12/02/15, 3:55 PM  Name: JOVAN BONA MRN: [id number] Date of Birth: [birth year]

## 2015-12-03 ENCOUNTER — Encounter: Payer: Self-pay | Admitting: Occupational Therapy

## 2015-12-03 ENCOUNTER — Ambulatory Visit: Payer: BC Managed Care – PPO | Admitting: Occupational Therapy

## 2015-12-03 ENCOUNTER — Ambulatory Visit: Payer: BC Managed Care – PPO | Admitting: Physical Therapy

## 2015-12-03 DIAGNOSIS — R2681 Unsteadiness on feet: Secondary | ICD-10-CM

## 2015-12-03 DIAGNOSIS — M25652 Stiffness of left hip, not elsewhere classified: Secondary | ICD-10-CM

## 2015-12-03 DIAGNOSIS — R2689 Other abnormalities of gait and mobility: Secondary | ICD-10-CM

## 2015-12-03 DIAGNOSIS — R293 Abnormal posture: Secondary | ICD-10-CM

## 2015-12-03 DIAGNOSIS — R278 Other lack of coordination: Secondary | ICD-10-CM

## 2015-12-03 DIAGNOSIS — R41844 Frontal lobe and executive function deficit: Secondary | ICD-10-CM

## 2015-12-03 DIAGNOSIS — M6281 Muscle weakness (generalized): Secondary | ICD-10-CM

## 2015-12-03 DIAGNOSIS — M25622 Stiffness of left elbow, not elsewhere classified: Secondary | ICD-10-CM

## 2015-12-03 DIAGNOSIS — M25611 Stiffness of right shoulder, not elsewhere classified: Secondary | ICD-10-CM

## 2015-12-03 DIAGNOSIS — M25612 Stiffness of left shoulder, not elsewhere classified: Secondary | ICD-10-CM

## 2015-12-03 NOTE — Therapy (Signed)
[facility] [address] [place], [place], [address] Phone: [phone]   Fax:  [phone]  Occupational Therapy Treatment  Patient Details  Name: Judy Spears MRN: [id number] Date of Birth: January 29, 1950 Referring Provider: Dr. Sharion Dove  Encounter Date: 12/03/2015      OT End of Session - 12/03/15 1651    Visit Number 29   Number of Visits 104   Date for OT Re-Evaluation 01/25/16   Authorization Type BCBS unlimited visits   OT Start Time [id number]   OT Stop Time 1530   OT Time Calculation (min) 45 min   Activity Tolerance Patient tolerated treatment well   Behavior During Therapy [facility] for tasks assessed/performed      Past Medical History:  Diagnosis Date  . Arthritis    lt great toe  . Complication of anesthesia    pt has had headaches post op-did advise to hydra well preop  . Hair loss    right-sided  . History of exercise stress test    a. ETT (12/15) with 12:00 exercise, no ST changes, occasional PACs.   . Hyperlipidemia   . Insomnia   . Migraine headache   . 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 Postmenopausal   . Premature atrial contractions    a. Holter (12/15) with 8% PACs, no atrial runs or atrial fibrillation.     Past Surgical History:  Procedure Laterality Date  . BREAST BIOPSY  01/01/2011   Procedure: BREAST BIOPSY WITH NEEDLE LOCALIZATION;  Surgeon: Rolm Bookbinder, MD;  Location: [place];  Service: General;  Laterality: Left;  left breast wire localization biopsy  . cataracts right eye    . CESAREAN SECTION  1986  . HERNIA REPAIR  2000   RIH  . opirectinal membrane peel    . RHINOPLASTY  1983    There were no vitals filed for this visit.      Subjective Assessment - 12/03/15 1634    Subjective  I am good   Pertinent History see epic snapshot - ABI/hypoxia from tic bite/ virus   Patient Stated Goals Pt unable to state.    Currently in Pain? No/denies    Pain Score 0-No pain                      OT Treatments/Exercises (OP) - 12/03/15 0001      ADLs   ADL Comments Patient's caregiver indicates that patient ahs been able ot assist better with standing for ADL over past two days.       Neurological Re-education Exercises   Other Exercises 1 Worked on componenets of active squat pivot transfer to address forward flexion at hips and LE activation to assist.  Worked on lateral components of transfer.  Sitting balance to address trunk mobility and postural control.  Patient able to sit unsupported for several minutes without any assistance in quiet treatment area today.  Addressed components of partial stand to stan, and high sit to standfrom edge of mat using UE's to pull forward.  Slowly attempted to reduce dependence on arms for this transition - had patient holding items with one or both hands - still braced arms against grab bar.   Patient able to transition from stedy seat to stand on multiple occasions without assistance,a nd even transition from high sit to partila stand in this session.  Patient needed assist to complete more upright posture in standing.  Worked  on using either hand to reach froward toward mirror to move lightweight items (post-its) Patient able on a few occasions to push down through her legs to allow her to reach forward with her arms.  Patient using vision at times to help effectiveness of hand reaching toward target, or to grasp object.                  OT Education - 12/03/15 1650    Education provided Yes   Education Details Mecahnics of sit to stand   Person(s) Educated Patient;Caregiver(s)   Methods Explanation;Demonstration;Tactile cues;Verbal cues   Comprehension Verbalized understanding;Need further instruction          OT Short Term Goals - 11/27/15 1712      OT SHORT TERM GOAL #1   Title Pt and husband will be mod I with HEP - 12/18/2015   Status On-going     OT SHORT TERM GOAL #2    Title Pt will demonstrate adequate postural control for breath support to voice at least 2 words   Status Achieved     OT SHORT TERM GOAL #3   Title Pt will transfer to commode with moderate assistance 25% of the time.   Status On-going     OT SHORT TERM GOAL #4   Title Pt will tolerate positional changes as evidenced by respiration rate and motor quieting in preparation for funcitonal mobility   Status Achieved           OT Long Term Goals - 11/27/15 1712      OT LONG TERM GOAL #1   Title Pt and husband will be mod I with upgraded HEP - 01/15/2016   Status On-going     OT LONG TERM GOAL #2   Title Pt will demonstrate adequate postural control to support 3 words in supported sitting.   Status On-going     OT LONG TERM GOAL #3   Title Pt will demonstrate ability for low reach with RUE for functional task with  mod facilitation   Status Achieved     OT LONG TERM GOAL #4   Title Pt will wash face with LUE with min a in supported sitting   Status Achieved     OT LONG TERM GOAL #5   Title Pt will release objects with L hand with min faciltation    Status Achieved     OT LONG TERM GOAL #6   Title Pt will stand with min a while caregiver manages pants during LB dressing 75% of the time.   Status On-going     OT LONG TERM GOAL #7   Title Pt will transfer to commode with mod a 50% of the time.   Status On-going     OT LONG TERM GOAL #8   Title Pt will be able to sit unsupported with supervision 50% of the time while engaged in functional task.   Status On-going               Plan - 12/03/15 1651    Clinical Impression Statement Patient's rehab course has been limited by fluctuation in physical performance.  Focus of therapy remains to decrease caregiver burden, and increase participation with ADL/functional mobility.   Rehab Potential Fair   Clinical Impairments Affecting Rehab Potential severity of deficits, slow rate of recovery   OT Frequency 3x / week    OT Duration 8 weeks   OT Treatment/Interventions Self-care/ADL training;Ultrasound;Moist Heat;Electrical Stimulation;DME and/or AE instruction;Neuromuscular education;Therapeutic exercise;Functional Mobility  Training;Manual Therapy;Passive range of motion;Splinting;Therapeutic activities;Balance training;Patient/family education;Visual/perceptual remediation/compensation;Cognitive remediation/compensation   Plan sit to squat, sit to stand, sitiing balance, functional use of UE's   OT Home Exercise Plan Discussed with patient, with husband present, the importance of flexing f   Consulted and Agree with Plan of Care Patient;Family member/caregiver   Family Member Consulted caregiver [name]      Patient will benefit from skilled therapeutic intervention in order to improve the following deficits and impairments:  Decreased endurance, Decreased skin integrity, Impaired vision/preception, Improper body mechanics, Impaired perceived functional ability, Decreased knowledge of precautions, Cardiopulmonary status limiting activity, Decreased activity tolerance, Decreased knowledge of use of DME, Decreased strength, Impaired flexibility, Improper spinal/pelvic alignment, Impaired sensation, Difficulty walking, Decreased mobility, Decreased balance, Decreased cognition, Decreased range of motion, Impaired tone, Pain, Impaired UE functional use, Decreased safety awareness, Decreased coordination  Visit Diagnosis: Abnormal posture  Muscle weakness (generalized)  Unsteadiness on feet  Other lack of coordination  Stiffness of left shoulder, not elsewhere classified  Stiffness of right shoulder, not elsewhere classified  Frontal lobe and executive function deficit  Stiffness of left elbow, not elsewhere classified    Problem List Patient Active Problem List   Diagnosis Date Noted  . Tracheostomy tube present ([place])   . Tracheal stenosis   . Chronic respiratory failure with hypoxia ([place])   . Spastic  tetraplegia ([place]) 10/20/2015  . Tracheostomy status ([place]) 09/26/2015  . Allergic rhinitis 09/26/2015  . 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 Movement disorder 09/22/2015  . Encephalitis 09/22/2015  . Chest pain 02/07/2014  . Premature atrial contractions 01/13/2014  . Abnormality of gait 12/04/2013  . Hyperlipidemia 12/21/2011  . Cardiovascular risk factor 12/21/2011  . Bunion 01/31/2008  . Metatarsalgia of both feet 09/20/2007  . FLAT FOOT 09/20/2007  . HALLUX RIGIDUS, ACQUIRED 09/20/2007    Mariah Milling, OTR/L 12/03/2015, 4:54 PM  [facility] [address] [place], [place], [address] Phone: [phone]   Fax:  [phone]  Name: Judy Spears MRN: [id number] Date of Birth: December 20, 1950

## 2015-12-03 NOTE — Therapy (Signed)
[facility] [address] [place], [place], [address] Phone: [phone]   Fax:  [phone]  Physical Therapy Treatment  Patient Details  Name: Judy Spears MRN: [id number] Date of Birth: Mar 05, 1950 Referring Provider: Alger Simons, MD  Encounter Date: 12/03/2015      PT End of Session - 12/03/15 1710    Visit Number 30   Number of Visits 45   Date for PT Re-Evaluation 01/18/16   Authorization Type BCBS   PT Start Time [id number]   PT Stop Time 1615   PT Time Calculation (min) 44 min   Equipment Utilized During Treatment Other (comment)  Stedy   Activity Tolerance Patient tolerated treatment well   Behavior During Therapy [facility] for tasks assessed/performed      Past Medical History:  Diagnosis Date  . Arthritis    lt great toe  . Complication of anesthesia    pt has had headaches post op-did advise to hydra well preop  . Hair loss    right-sided  . History of exercise stress test    a. ETT (12/15) with 12:00 exercise, no ST changes, occasional PACs.   . Hyperlipidemia   . Insomnia   . Migraine headache   . 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 Postmenopausal   . Premature atrial contractions    a. Holter (12/15) with 8% PACs, no atrial runs or atrial fibrillation.     Past Surgical History:  Procedure Laterality Date  . BREAST BIOPSY  01/01/2011   Procedure: BREAST BIOPSY WITH NEEDLE LOCALIZATION;  Surgeon: Rolm Bookbinder, MD;  Location: [facility];  Service: General;  Laterality: Left;  left breast wire localization biopsy  . cataracts right eye    . CESAREAN SECTION  1986  . HERNIA REPAIR  2000   RIH  . opirectinal membrane peel    . RHINOPLASTY  1983    There were no vitals filed for this visit.      Subjective Assessment - 12/03/15 1708    Subjective Pt received with OT, [name], after session. Pt awake/alert and smiling, engaging in game of tic-tac-toe.   Patient is  accompained by: Family member  caregiver, [name]   Pertinent History Precautions:  PEG, seizure. 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 No/denies        NMR: Multiple sit <> stand transfers from elevated mat table with assist of Stedy lift with pt progressing from min A to supervision to initiate sit > stand. Once standing using Stedy, pt engaged in game of tic-tac-toe (via placement of post-it notes on mirror) to address postural control in standing with single UE support, during functional activity using UE's. Cueing focused on activation of LE/trunk extensors prior to initiating UE reaching task. Pt remained in standing for >90% of this session. Longest standing trial with Stedy = 6.5 consecutive minutes with supervision, intermittent cueing for postural awareness, self-correction. Returned to w/c via lateral scooting transfer from elevated mat table > personal w/c with mod A. Pt did appear to benefit from use of B custom AFO's during this session and did report increased ease of standing using AFO's with Stedy.  PT Education - 12/03/15 1707    Education provided Yes   Education Details Provided caregiver, [name], with paper copy of short and long term goals. Plan to further discuss if caregiver, husband have questions.   Person(s) Educated Theatre stage manager)   Methods Handout   Comprehension Verbalized understanding;Need further instruction          PT Short Term Goals - 11/19/15 2029      PT SHORT TERM GOAL #1   Title Pt will consistently perform supine rolling to in B directions with supervision, 25% cueing to indicate increased trunk dissociation, increased  independence with bed mobility.  (Target date:12/17/15)    Time 4   Period Weeks   Status New     PT SHORT TERM GOAL #2   Title Pt will consistently perform supine <> sit with min A, 25% cueing to indicate increased independence getting out of bed. (12/17/15)   Time 4   Period Weeks   Status New     PT SHORT TERM GOAL #3   Title Pt will demonstrate midline posture in static sitting with BLE support with 50% verbal cueing to indicate increased postural awareness/control.  (12/17/15)   Time 4   Period Weeks   Status New     PT SHORT TERM GOAL #4   Title Pt will consistently perform level transfers from w/c <> mat table with mod A, 50% cueing to decrease caregiver burden.  (12/17/15)   Time 4   Period Weeks   Status New     PT SHORT TERM GOAL #5   Title Pt will consistently perform sit <> stand from personal w/c with BUE support and mod A, 50% cueing to decrease caregiver burden.  (12/17/15)   Time 4   Period Weeks   Status New     Additional Short Term Goals   Additional Short Term Goals Yes     PT SHORT TERM GOAL #6   Title Pt will perform static standing with BUE support x1 minute with supervision, no overt LOB to decrease caregiver burden.  (12/17/15)   Time 4   Period Weeks   Status New     PT SHORT TERM GOAL #7   Title PT will assess current wheelchair and determine if alternative seating system would better maximize pt safety with functional mobility.  (12/17/15)   Time 4   Period Weeks   Status New           PT Long Term Goals - 11/19/15 2034      PT LONG TERM GOAL #1   Title Pt will consistently perform supine <> sit with supervision, 25% cueing to indicate increased independence getting out of bed.   (Target: 01/14/16)   Time 8   Period Weeks   Status New     PT LONG TERM GOAL #2   Title Pt will demonstrate midline posture in static sitting with BLE support with subtle to min cueing to indicate increased postural awareness/control. (01/14/16)   Time 8    Period Weeks   Status New     PT LONG TERM GOAL #3   Title Pt will consistently perform level transfers from w/c <> mat table with min A, 25% cueing to decrease caregiver burden.  (01/14/16)   Time 8   Period Weeks   Status New     PT LONG TERM GOAL #4   Title Pt will consistently perform sit <> stand from personal w/c with BUE support and min A, 25% cueing  to decrease caregiver burden.  (01/14/16)   Time 8   Period Weeks   Status New     PT LONG TERM GOAL #5   Title PT will assess gait to determine if ambulation is safe/functional.  (01/14/16)   Time 8   Period Weeks   Status New               Plan - 12/03/15 1713    Clinical Impression Statement Skilled session focused on increasing pt independence with sit <> stand transfers using [place] lift. Pt was able to perform sit > stand from elevated mat table with supervision using Stedy. Pt tolerated standing with Stedy for multiple trials (up to 6.5 minutes consecutively. Full B hip extension continues to be limited in standing.    Rehab Potential Fair   Clinical Impairments Affecting Rehab Potential Positive: strong family/social support; negative: chronicity of condition   PT Frequency 3x / week  followed by 2x/week for 4 weeks   PT Duration 4 weeks  followed by 2x/week for 4 weeks   PT Treatment/Interventions ADLs/Self Care Home Management;DME Instruction;Gait training;Stair training;Functional mobility training;Therapeutic activities;Patient/family education;Cognitive remediation;Neuromuscular re-education;Therapeutic exercise;Balance training;Orthotic Fit/Training;Wheelchair mobility training;Manual techniques;Splinting;Visual/perceptual remediation/compensation;Vestibular;Passive range of motion   PT Next Visit Plan Continue to address bed mobility; Stedy for anterior weight shift, LE activaiton, and final 50% of B hip/knee extension in standing; postural awareness/self-correction.   Consulted and Agree with Plan of Care  Patient;Family member/caregiver   Family Member Consulted caregiver, [name]      Patient will benefit from skilled therapeutic intervention in order to improve the following deficits and impairments:  Abnormal gait, Decreased balance, Decreased endurance, Decreased cognition, Cardiopulmonary status limiting activity, Decreased activity tolerance, Decreased coordination, Decreased strength, Impaired flexibility, Impaired tone, Decreased mobility, Decreased skin integrity, Increased muscle spasms, Impaired vision/preception, Decreased range of motion, Impaired UE functional use, Postural dysfunction, Pain  Visit Diagnosis: Other abnormalities of gait and mobility  Abnormal posture  Unsteadiness on feet  Other lack of coordination  Stiffness of left hip, not elsewhere classified  Muscle weakness (generalized)     Problem List Patient Active Problem List   Diagnosis Date Noted  . Tracheostomy tube present ([place])   . Tracheal stenosis   . Chronic respiratory failure with hypoxia ([place])   . 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[place]) 10/20/2015  . Tracheostomy status ([place]) 09/26/2015  . Allergic rhinitis 09/26/2015  . 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 Movement disorder 09/22/2015  . Encephalitis 09/22/2015  . Chest pain 02/07/2014  . Premature atrial contractions 01/13/2014  . Abnormality of gait 12/04/2013  . Hyperlipidemia 12/21/2011  . Cardiovascular risk factor 12/21/2011  . Bunion 01/31/2008  . Metatarsalgia of both feet 09/20/2007  . FLAT FOOT 09/20/2007  . HALLUX RIGIDUS, ACQUIRED 09/20/2007    Billie Ruddy, PT, DPT [facility] [address] [place], [place], [address] Phone: [phone]   Fax:  [phone] 12/03/15, 5:20 PM  Name: ROSS SZABO MRN: [id number] Date of Birth: Nov 24, 1950

## 2015-12-05 ENCOUNTER — Telehealth: Payer: Self-pay | Admitting: Physical Medicine & Rehabilitation

## 2015-12-05 ENCOUNTER — Ambulatory Visit: Payer: BC Managed Care – PPO | Admitting: Rehabilitation

## 2015-12-05 ENCOUNTER — Encounter: Payer: Self-pay | Admitting: Rehabilitation

## 2015-12-05 ENCOUNTER — Encounter: Payer: Self-pay | Admitting: Occupational Therapy

## 2015-12-05 ENCOUNTER — Ambulatory Visit: Payer: BC Managed Care – PPO | Admitting: Occupational Therapy

## 2015-12-05 DIAGNOSIS — M25612 Stiffness of left shoulder, not elsewhere classified: Secondary | ICD-10-CM

## 2015-12-05 DIAGNOSIS — R41844 Frontal lobe and executive function deficit: Secondary | ICD-10-CM

## 2015-12-05 DIAGNOSIS — M25611 Stiffness of right shoulder, not elsewhere classified: Secondary | ICD-10-CM

## 2015-12-05 DIAGNOSIS — R2689 Other abnormalities of gait and mobility: Secondary | ICD-10-CM | POA: Diagnosis not present

## 2015-12-05 DIAGNOSIS — M25622 Stiffness of left elbow, not elsewhere classified: Secondary | ICD-10-CM

## 2015-12-05 DIAGNOSIS — R2681 Unsteadiness on feet: Secondary | ICD-10-CM

## 2015-12-05 DIAGNOSIS — R293 Abnormal posture: Secondary | ICD-10-CM

## 2015-12-05 DIAGNOSIS — M6281 Muscle weakness (generalized): Secondary | ICD-10-CM

## 2015-12-05 DIAGNOSIS — R278 Other lack of coordination: Secondary | ICD-10-CM

## 2015-12-05 NOTE — Therapy (Signed)
[facility] [address] [place], [place], [address] Phone: [phone]   Fax:  [phone]  Physical Therapy Treatment  Patient Details  Name: Judy Spears MRN: [id number] Date of Birth: 11/27/1950 Referring Provider: Alger Simons, MD  Encounter Date: 12/05/2015      PT End of Session - 12/05/15 1914    Visit Number 31   Number of Visits 45   Date for PT Re-Evaluation 01/18/16   Authorization Type BCBS   PT Start Time 1019  pt late to appt   PT Stop Time 1102   PT Time Calculation (min) 43 min   Equipment Utilized During Treatment Other (comment)  Stedy   Activity Tolerance Patient tolerated treatment well   Behavior During Therapy [facility] for tasks assessed/performed      Past Medical History:  Diagnosis Date  . Arthritis    lt great toe  . Complication of anesthesia    pt has had headaches post op-did advise to hydra well preop  . Hair loss    right-sided  . History of exercise stress test    a. ETT (12/15) with 12:00 exercise, no ST changes, occasional PACs.   . Hyperlipidemia   . Insomnia   . Migraine headache   . 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 Postmenopausal   . Premature atrial contractions    a. Holter (12/15) with 8% PACs, no atrial runs or atrial fibrillation.     Past Surgical History:  Procedure Laterality Date  . BREAST BIOPSY  01/01/2011   Procedure: BREAST BIOPSY WITH NEEDLE LOCALIZATION;  Surgeon: Rolm Bookbinder, MD;  Location: [facility];  Service: General;  Laterality: Left;  left breast wire localization biopsy  . cataracts right eye    . CESAREAN SECTION  1986  . HERNIA REPAIR  2000   RIH  . opirectinal membrane peel    . RHINOPLASTY  1983    There were no vitals filed for this visit.      Subjective Assessment - 12/05/15 1904    Subjective Pt awake/alert agreeable to PT session.    Patient is accompained by: Family member   Pertinent  History Precautions:  PEG, seizure. 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 No/denies                         [facility] Adult PT Treatment/Exercise - 12/05/15 0001      Transfers   Transfers Sit to Stand;Stand to Sit;Squat Pivot Transfers   Sit to Stand 2: Max assist;3: Mod assist;4: Min assist;From elevated surface;With upper extremity assist   Sit to Stand Details Tactile cues for initiation;Tactile cues for placement;Verbal cues for sequencing;Verbal cues for technique;Visual cues/gestures for sequencing;Manual facilitation for weight shifting;Manual facilitation for placement;Manual facilitation for weight bearing   Sit to Stand Details (indicate cue type and reason) Performed partial sit<>stand from [facility] during session to address improved forward weight shift, WB through LEs and improved trunk control.  Note pt with varying levels of assist needed during session.  Allowed increased time for pt to complete, however at times, would require up to max A to come into standing.  Cues for increased hip extension and elevated trunk.     Stand to Sit 4: Min assist;3: Mod assist   Stand to Sit Details (indicate cue type and reason) Manual facilitation for weight shifting;Tactile cues for weight shifting;Verbal cues for technique   Stand to Sit Details Assist for controlled descent with cues for descent as well.     Squat Pivot Transfers 2: [facility] nurse Details (indicate cue type and reason) Performed squat pivot transfer w/c>mat at beginning of session.  Pt did very well initiating forward weight shift with cues for guiding hands down legs to improve forward weight shift.        Neuro Re-ed    Neuro Re-ed Details  Multiple attempts/bouts of semi standing at kitchen counter in stedy lift in order to work in imporved trunk control, WB through LEs, upright posture and lateral weight shifting.  Had pt reach into cabinet x 3 reps with assist for RUE to grasp object.  Then attempted to have pt pass to L hand for better support.  Pt unable to maintain standing without A for up to 1 min on reach rep.  Pt verbalized needing/wanting to sit in regular chair due to pressure on knees when standing.                    PT Short Term Goals - 11/19/15 2029      PT SHORT TERM GOAL #1   Title Pt will consistently perform supine rolling to in B directions with supervision, 25% cueing to indicate increased trunk dissociation, increased independence with bed mobility.  (Target date:12/17/15)    Time 4   Period Weeks   Status New     PT SHORT TERM GOAL #2   Title Pt will consistently perform supine <> sit with min A, 25% cueing to indicate increased independence getting out of bed. (12/17/15)   Time 4   Period Weeks   Status New     PT SHORT TERM GOAL #3   Title Pt will demonstrate midline posture in static sitting with BLE support with 50% verbal cueing to indicate increased postural awareness/control.  (12/17/15)   Time 4   Period Weeks   Status New     PT SHORT TERM GOAL #4   Title Pt will consistently perform level transfers from w/c <> mat table with mod A, 50% cueing to decrease caregiver burden.  (12/17/15)   Time 4   Period Weeks   Status New     PT SHORT TERM GOAL #5   Title Pt will consistently perform sit <> stand from personal w/c with BUE support and mod A, 50% cueing to decrease caregiver burden.  (12/17/15)   Time 4   Period Weeks   Status New     Additional Short Term Goals   Additional Short Term Goals Yes     PT SHORT TERM GOAL #6   Title Pt will perform static standing with BUE support x1 minute with supervision, no overt LOB to decrease  caregiver burden.  (12/17/15)   Time 4   Period Weeks   Status New     PT SHORT TERM GOAL #7   Title PT will assess current wheelchair and determine if alternative seating system would better maximize pt safety with functional mobility.  (12/17/15)   Time 4   Period Weeks   Status New           PT Long Term Goals -  11/19/15 2034      PT LONG TERM GOAL #1   Title Pt will consistently perform supine <> sit with supervision, 25% cueing to indicate increased independence getting out of bed.   (Target: 01/14/16)   Time 8   Period Weeks   Status New     PT LONG TERM GOAL #2   Title Pt will demonstrate midline posture in static sitting with BLE support with subtle to min cueing to indicate increased postural awareness/control. (01/14/16)   Time 8   Period Weeks   Status New     PT LONG TERM GOAL #3   Title Pt will consistently perform level transfers from w/c <> mat table with min A, 25% cueing to decrease caregiver burden.  (01/14/16)   Time 8   Period Weeks   Status New     PT LONG TERM GOAL #4   Title Pt will consistently perform sit <> stand from personal w/c with BUE support and min A, 25% cueing to decrease caregiver burden.  (01/14/16)   Time 8   Period Weeks   Status New     PT LONG TERM GOAL #5   Title PT will assess gait to determine if ambulation is safe/functional.  (01/14/16)   Time 8   Period Weeks   Status New               Plan - 12/05/15 1915    Clinical Impression Statement Skilled session continues to focus on sit<>stand using Stedy lift.  Note decreased ability to elevate into standing today requiring varying levels of assist to stand.  At times able to perform with min A, at other times max A.  Also note she was only able to remain in partial standing for up to 1 min at a time.     Rehab Potential Fair   Clinical Impairments Affecting Rehab Potential Positive: strong family/social support; negative: chronicity of condition   PT Frequency 3x /  week  followed by 2x/week for 4 weeks   PT Duration 4 weeks  followed by 2x/week for 4 weeks   PT Treatment/Interventions ADLs/Self Care Home Management;DME Instruction;Gait training;Stair training;Functional mobility training;Therapeutic activities;Patient/family education;Cognitive remediation;Neuromuscular re-education;Therapeutic exercise;Balance training;Orthotic Fit/Training;Wheelchair mobility training;Manual techniques;Splinting;Visual/perceptual remediation/compensation;Vestibular;Passive range of motion   PT Next Visit Plan Continue to address bed mobility; Stedy for anterior weight shift, LE activaiton, and final 50% of B hip/knee extension in standing; postural awareness/self-correction.   Consulted and Agree with Plan of Care Patient;Family member/caregiver   Family Member Consulted caregiver, [name]      Patient will benefit from skilled therapeutic intervention in order to improve the following deficits and impairments:  Abnormal gait, Decreased balance, Decreased endurance, Decreased cognition, Cardiopulmonary status limiting activity, Decreased activity tolerance, Decreased coordination, Decreased strength, Impaired flexibility, Impaired tone, Decreased mobility, Decreased skin integrity, Increased muscle spasms, Impaired vision/preception, Decreased range of motion, Impaired UE functional use, Postural dysfunction, Pain  Visit Diagnosis: Abnormal posture  Unsteadiness on feet  Muscle weakness (generalized)  Other abnormalities of gait and mobility     Problem List Patient Active Problem List   Diagnosis Date Noted  . Tracheostomy tube present ([place])   . Tracheal stenosis   . Chronic respiratory failure with hypoxia ([place])   . 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[place]) 10/20/2015  . Tracheostomy status ([place]) 09/26/2015  . Allergic rhinitis 09/26/2015  . 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 Movement disorder 09/22/2015  . Encephalitis 09/22/2015  . Chest pain 02/07/2014  . Premature  atrial contractions 01/13/2014  .  Abnormality of gait 12/04/2013  . Hyperlipidemia 12/21/2011  . Cardiovascular risk factor 12/21/2011  . Bunion 01/31/2008  . Metatarsalgia of both feet 09/20/2007  . FLAT FOOT 09/20/2007  . HALLUX RIGIDUS, ACQUIRED 09/20/2007   Cameron Sprang, PT, MPT [facility] [address] [place], [place], [address] Phone: [phone]   Fax:  [phone] 12/05/15, 7:21 PM  Name: Judy Spears MRN: [id number] Date of Birth: 01/01/1951

## 2015-12-05 NOTE — Therapy (Signed)
[facility] [address] [place], [place], [address] Phone: [phone]   Fax:  [phone]  Occupational Therapy Treatment  Patient Details  Name: Judy Spears MRN: [id number] Date of Birth: [birth year] Referring Provider: Dr. Sharion Dove  Encounter Date: 12/05/2015      OT End of Session - 12/05/15 1418    Visit Number 30   Number of Visits 71   Date for OT Re-Evaluation 01/25/16   Authorization Type BCBS unlimited visits   OT Start Time 1100   OT Stop Time 1145   OT Time Calculation (min) 45 min   Activity Tolerance Patient tolerated treatment well   Behavior During Therapy [facility] for tasks assessed/performed      Past Medical History:  Diagnosis Date  . Arthritis    lt great toe  . Complication of anesthesia    pt has had headaches post op-did advise to hydra well preop  . Hair loss    right-sided  . History of exercise stress test    a. ETT (12/15) with 12:00 exercise, no ST changes, occasional PACs.   . Hyperlipidemia   . Insomnia   . Migraine headache   . 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 Postmenopausal   . Premature atrial contractions    a. Holter (12/15) with 8% PACs, no atrial runs or atrial fibrillation.     Past Surgical History:  Procedure Laterality Date  . BREAST BIOPSY  01/01/2011   Procedure: BREAST BIOPSY WITH NEEDLE LOCALIZATION;  Surgeon: Rolm Bookbinder, MD;  Location: [facility];  Service: General;  Laterality: Left;  left breast wire localization biopsy  . cataracts right eye    . CESAREAN SECTION  1986  . HERNIA REPAIR  2000   RIH  . opirectinal membrane peel    . RHINOPLASTY  1983    There were no vitals filed for this visit.      Subjective Assessment - 12/05/15 1411    Subjective  Hi   Pertinent History see epic snapshot - ABI/hypoxia from tic bite/ virus   Patient Stated Goals Pt unable to state.    Currently in Pain? No/denies   Pain  Score 0-No pain                      OT Treatments/Exercises (OP) - 12/05/15 0001      ADLs   Functional Mobility Worked on components of slide board transfer, with emphasis on forward weight shift.  Patient once brought to 90 degree angle (back to hips) able to continue to flex at hips to bring trunk forward.  Initial transition from back of wheelchair was difficult today, and patient required min assist.       Neurological Re-education Exercises   Other Exercises 1 Neuromuscular reeducation to address lower body initiated movement with rolling.  Patient is accustomed to pulling herself with her arms, but has some isolated leg motion which would be helpful in transtioning in and out of various positions.  Worked on dissociation of upper and lower trunk, reducing extensor tension in neck - for a task that requires gentle flexion, and use of arms to aide with flexion / rotation pattern needed for rolling.  In sidelying propped on elbow, patient able to initiate weight shift through body for lateral flexion and extension to both sides.  Patient able to tolerate positional changes and responded well to flexion / rotation patterns to reduce strong  extensor patterning.                    OT Short Term Goals - 11/27/15 1712      OT SHORT TERM GOAL #1   Title Pt and husband will be mod I with HEP - 12/18/2015   Status On-going     OT SHORT TERM GOAL #2   Title Pt will demonstrate adequate postural control for breath support to voice at least 2 words   Status Achieved     OT SHORT TERM GOAL #3   Title Pt will transfer to commode with moderate assistance 25% of the time.   Status On-going     OT SHORT TERM GOAL #4   Title Pt will tolerate positional changes as evidenced by respiration rate and motor quieting in preparation for funcitonal mobility   Status Achieved           OT Long Term Goals - 11/27/15 1712      OT LONG TERM GOAL #1   Title Pt and husband will  be mod I with upgraded HEP - 01/15/2016   Status On-going     OT LONG TERM GOAL #2   Title Pt will demonstrate adequate postural control to support 3 words in supported sitting.   Status On-going     OT LONG TERM GOAL #3   Title Pt will demonstrate ability for low reach with RUE for functional task with  mod facilitation   Status Achieved     OT LONG TERM GOAL #4   Title Pt will wash face with LUE with min a in supported sitting   Status Achieved     OT LONG TERM GOAL #5   Title Pt will release objects with L hand with min faciltation    Status Achieved     OT LONG TERM GOAL #6   Title Pt will stand with min a while caregiver manages pants during LB dressing 75% of the time.   Status On-going     OT LONG TERM GOAL #7   Title Pt will transfer to commode with mod a 50% of the time.   Status On-going     OT LONG TERM GOAL #8   Title Pt will be able to sit unsupported with supervision 50% of the time while engaged in functional task.   Status On-going               Plan - 12/05/15 1418    Clinical Impression Statement Patient with fluctuating physical performance - which limits steady forward progression toward long term goals.  Patient has excellent family support.     Rehab Potential Fair   Clinical Impairments Affecting Rehab Potential severity of deficits, slow rate of recovery   OT Frequency 3x / week   OT Duration 8 weeks   OT Treatment/Interventions Self-care/ADL training;Ultrasound;Moist Heat;Electrical Stimulation;DME and/or AE instruction;Neuromuscular education;Therapeutic exercise;Functional Mobility Training;Manual Therapy;Passive range of motion;Splinting;Therapeutic activities;Balance training;Patient/family education;Visual/perceptual remediation/compensation;Cognitive remediation/compensation   Plan sitting balance, functional use of BUE's, sit to partial stand, partial stand to stand, stand tolerance in lift   Consulted and Agree with Plan of Care  Patient;Family member/caregiver   Family Member Consulted caregiver [name]      Patient will benefit from skilled therapeutic intervention in order to improve the following deficits and impairments:  Decreased endurance, Decreased skin integrity, Impaired vision/preception, Improper body mechanics, Impaired perceived functional ability, Decreased knowledge of precautions, Cardiopulmonary status limiting activity, Decreased activity tolerance, Decreased knowledge of  use of DME, Decreased strength, Impaired flexibility, Improper spinal/pelvic alignment, Impaired sensation, Difficulty walking, Decreased mobility, Decreased balance, Decreased cognition, Decreased range of motion, Impaired tone, Pain, Impaired UE functional use, Decreased safety awareness, Decreased coordination  Visit Diagnosis: Abnormal posture  Unsteadiness on feet  Other lack of coordination  Muscle weakness (generalized)  Stiffness of left shoulder, not elsewhere classified  Stiffness of right shoulder, not elsewhere classified  Frontal lobe and executive function deficit  Stiffness of left elbow, not elsewhere classified    Problem List Patient Active Problem List   Diagnosis Date Noted  . Tracheostomy tube present ([place])   . Tracheal stenosis   . Chronic respiratory failure with hypoxia ([place])   . 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 Tracheostomy status ([place]) 09/26/2015  . Allergic rhinitis 09/26/2015  . 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 Movement disorder 09/22/2015  . Encephalitis 09/22/2015  . Chest pain 02/07/2014  . Premature atrial contractions 01/13/2014  . Abnormality of gait 12/04/2013  . Hyperlipidemia 12/21/2011  . Cardiovascular risk factor 12/21/2011  . Bunion 01/31/2008  . Metatarsalgia of both feet 09/20/2007  . FLAT FOOT 09/20/2007  . HALLUX RIGIDUS, ACQUIRED 09/20/2007    Mariah Milling, OTR/L 12/05/2015, 2:21 PM  [facility] [address] [place], [place], [address] Phone: [phone]   Fax:  [phone]  Name: Judy Spears MRN: [id number] Date of Birth: [birth year]

## 2015-12-05 NOTE — Telephone Encounter (Signed)
They are forgotten to give Judy Spears her Klonopin one afternoon and she did well last week, she was more alert.  They would like to stop giving it to her in the afternoon and need to know if this would be okay with Dr. [name].  Please call [name] at [phone].

## 2015-12-08 ENCOUNTER — Ambulatory Visit: Payer: BC Managed Care – PPO | Admitting: Physical Therapy

## 2015-12-08 ENCOUNTER — Encounter: Payer: Self-pay | Admitting: Occupational Therapy

## 2015-12-08 ENCOUNTER — Ambulatory Visit: Payer: BC Managed Care – PPO | Admitting: *Deleted

## 2015-12-08 ENCOUNTER — Ambulatory Visit: Payer: BC Managed Care – PPO | Admitting: Occupational Therapy

## 2015-12-08 DIAGNOSIS — M6281 Muscle weakness (generalized): Secondary | ICD-10-CM

## 2015-12-08 DIAGNOSIS — M25622 Stiffness of left elbow, not elsewhere classified: Secondary | ICD-10-CM

## 2015-12-08 DIAGNOSIS — R41844 Frontal lobe and executive function deficit: Secondary | ICD-10-CM

## 2015-12-08 DIAGNOSIS — R1312 Dysphagia, oropharyngeal phase: Secondary | ICD-10-CM

## 2015-12-08 DIAGNOSIS — R278 Other lack of coordination: Secondary | ICD-10-CM

## 2015-12-08 DIAGNOSIS — R471 Dysarthria and anarthria: Secondary | ICD-10-CM

## 2015-12-08 DIAGNOSIS — M25611 Stiffness of right shoulder, not elsewhere classified: Secondary | ICD-10-CM

## 2015-12-08 DIAGNOSIS — R2689 Other abnormalities of gait and mobility: Secondary | ICD-10-CM

## 2015-12-08 DIAGNOSIS — R2681 Unsteadiness on feet: Secondary | ICD-10-CM

## 2015-12-08 DIAGNOSIS — R41841 Cognitive communication deficit: Secondary | ICD-10-CM

## 2015-12-08 DIAGNOSIS — M25612 Stiffness of left shoulder, not elsewhere classified: Secondary | ICD-10-CM

## 2015-12-08 DIAGNOSIS — R293 Abnormal posture: Secondary | ICD-10-CM

## 2015-12-08 NOTE — Therapy (Signed)
[facility] [address] [place], [place], [address] Phone: [phone]   Fax:  [phone]  Physical Therapy Treatment  Patient Details  Name: Judy Spears MRN: [id number] Date of Birth: [birth year] Referring Provider: Alger Simons, MD  Encounter Date: 12/08/2015      PT End of Session - 12/08/15 1715    Visit Number 32   Number of Visits 45   Date for PT Re-Evaluation 01/18/16   Authorization Type BCBS   PT Start Time 1447   PT Stop Time 1531   PT Time Calculation (min) 44 min      Past Medical History:  Diagnosis Date  . Arthritis    lt great toe  . Complication of anesthesia    pt has had headaches post op-did advise to hydra well preop  . Hair loss    right-sided  . History of exercise stress test    a. ETT (12/15) with 12:00 exercise, no ST changes, occasional PACs.   . Hyperlipidemia   . Insomnia   . Migraine headache   . 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 Postmenopausal   . Premature atrial contractions    a. Holter (12/15) with 8% PACs, no atrial runs or atrial fibrillation.     Past Surgical History:  Procedure Laterality Date  . BREAST BIOPSY  01/01/2011   Procedure: BREAST BIOPSY WITH NEEDLE LOCALIZATION;  Surgeon: Rolm Bookbinder, MD;  Location: [facility];  Service: General;  Laterality: Left;  left breast wire localization biopsy  . cataracts right eye    . CESAREAN SECTION  1986  . HERNIA REPAIR  2000   RIH  . opirectinal membrane peel    . RHINOPLASTY  1983    There were no vitals filed for this visit.      Subjective Assessment - 12/08/15 1710    Subjective Pt expresses desire to work on standing in [place] after asked preference of focus of today's PT session   Patient is accompained by: Judy Spears, caregiver   Pertinent History Precautions:  PEG, seizure. PMH significant for: viral encephalitis due to [place] Virus (01/02/16), acute respiratory  failure, R vocal cord paralysis, tracheal stenosis s/p repair on 07/08/15, s/p Botox injections in B ankle plantarflexors and L SCM/scalenes    Patient Stated Goals Per husband, "For Judy Spears to be as independent as possible."   Currently in Pain? No/denies                         [facility] Adult PT Treatment/Exercise - 12/08/15 1712      Transfers   Transfers Sit to Stand;Stand to Sit;Squat Pivot Transfers   Sit to Stand 2: Max assist;3: Mod assist;Multiple attempts;From elevated surface  from hi/low mat table to Stedy   Sit to Stand Details Verbal cues for technique;Verbal cues for sequencing;Manual facilitation for weight shifting;Tactile cues for initiation   Sit to Stand: Patient Percentage 30%  % varies with performance and repetition     Pt seated on high/low mat table from previous OT session; performed sit to stand with use of Stedy Lift with varying levels of assist and  With cues to fascilitate anterior weight shift:  Mirror used in front for visual feedback and pt wrote name on mirror to fascilitate upright standing And anterior weight shift:  Transferred to wheelchair from [place] - attempted forward lean - pt on edge of wheelchair so pt was  transferred into Regular chair; used large blue physioball with mod to min assist for pushing ball forward - stool placed approx. 6-8" in front for target - pt performed 3-4 reps with mod to max assist  Max assist from wheelchair to [facility] for standing:  Mod assist +2 needed for sit to stand from blue chair to Stedy  Pt stood for approx. 3" -5" 3-4 reps in [place] for LE activation, strengthening and posture retraining  Pt transferred back into [place] and transferred to Speech therapy in standing position with use of this device             PT Short Term Goals - 11/19/15 2029      PT SHORT TERM GOAL #1   Title Pt will consistently perform supine rolling to in B directions with supervision, 25% cueing to indicate increased  trunk dissociation, increased independence with bed mobility.  (Target date:12/17/15)    Time 4   Period Weeks   Status New     PT SHORT TERM GOAL #2   Title Pt will consistently perform supine <> sit with min A, 25% cueing to indicate increased independence getting out of bed. (12/17/15)   Time 4   Period Weeks   Status New     PT SHORT TERM GOAL #3   Title Pt will demonstrate midline posture in static sitting with BLE support with 50% verbal cueing to indicate increased postural awareness/control.  (12/17/15)   Time 4   Period Weeks   Status New     PT SHORT TERM GOAL #4   Title Pt will consistently perform level transfers from w/c <> mat table with mod A, 50% cueing to decrease caregiver burden.  (12/17/15)   Time 4   Period Weeks   Status New     PT SHORT TERM GOAL #5   Title Pt will consistently perform sit <> stand from personal w/c with BUE support and mod A, 50% cueing to decrease caregiver burden.  (12/17/15)   Time 4   Period Weeks   Status New     Additional Short Term Goals   Additional Short Term Goals Yes     PT SHORT TERM GOAL #6   Title Pt will perform static standing with BUE support x1 minute with supervision, no overt LOB to decrease caregiver burden.  (12/17/15)   Time 4   Period Weeks   Status New     PT SHORT TERM GOAL #7   Title PT will assess current wheelchair and determine if alternative seating system would better maximize pt safety with functional mobility.  (12/17/15)   Time 4   Period Weeks   Status New           PT Long Term Goals - 11/19/15 2034      PT LONG TERM GOAL #1   Title Pt will consistently perform supine <> sit with supervision, 25% cueing to indicate increased independence getting out of bed.   (Target: 01/14/16)   Time 8   Period Weeks   Status New     PT LONG TERM GOAL #2   Title Pt will demonstrate midline posture in static sitting with BLE support with subtle to min cueing to indicate increased postural  awareness/control. (01/14/16)   Time 8   Period Weeks   Status New     PT LONG TERM GOAL #3   Title Pt will consistently perform level transfers from w/c <> mat table with min A, 25% cueing to  decrease caregiver burden.  (01/14/16)   Time 8   Period Weeks   Status New     PT LONG TERM GOAL #4   Title Pt will consistently perform sit <> stand from personal w/c with BUE support and min A, 25% cueing to decrease caregiver burden.  (01/14/16)   Time 8   Period Weeks   Status New     PT LONG TERM GOAL #5   Title PT will assess gait to determine if ambulation is safe/functional.  (01/14/16)   Time 8   Period Weeks   Status New               Plan - 12/08/15 1716    Clinical Impression Statement Pt able to initiate sit to stand using Stedy lift:  pt required tactile/verbal cues for correct hand placement to fascilitate anterior weight shift - has much difficulty leaning forward for anterior weight translation; initiation varied during session   Rehab Potential Fair   Clinical Impairments Affecting Rehab Potential Positive: strong family/social support; negative: chronicity of condition   PT Frequency 3x / week   PT Duration 4 weeks   PT Treatment/Interventions ADLs/Self Care Home Management;DME Instruction;Gait training;Stair training;Functional mobility training;Therapeutic activities;Patient/family education;Cognitive remediation;Neuromuscular re-education;Therapeutic exercise;Balance training;Orthotic Fit/Training;Wheelchair mobility training;Manual techniques;Splinting;Visual/perceptual remediation/compensation;Vestibular;Passive range of motion   PT Next Visit Plan Continue to address bed mobility; Stedy for anterior weight shift, LE activaiton, and final 50% of B hip/knee extension in standing; postural awareness/self-correction; work on active leaning anteriorly with anterior pelvic tilt as able with use of physioball in front   Consulted and Agree with Plan of Care Family  member/caregiver;Patient   Family Member Consulted caregiver, Judy Spears      Patient will benefit from skilled therapeutic intervention in order to improve the following deficits and impairments:  Abnormal gait, Decreased balance, Decreased endurance, Decreased cognition, Cardiopulmonary status limiting activity, Decreased activity tolerance, Decreased coordination, Decreased strength, Impaired flexibility, Impaired tone, Decreased mobility, Decreased skin integrity, Increased muscle spasms, Impaired vision/preception, Decreased range of motion, Impaired UE functional use, Postural dysfunction, Pain  Visit Diagnosis: Other abnormalities of gait and mobility  Muscle weakness (generalized)     Problem List Patient Active Problem List   Diagnosis Date Noted  . Tracheostomy tube present ([place])   . Tracheal stenosis   . Chronic respiratory failure with hypoxia ([place])   . 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[place]) 10/20/2015  . Tracheostomy status ([place]) 09/26/2015  . Allergic rhinitis 09/26/2015  . 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 Movement disorder 09/22/2015  . Encephalitis 09/22/2015  . Chest pain 02/07/2014  . Premature atrial contractions 01/13/2014  . Abnormality of gait 12/04/2013  . Hyperlipidemia 12/21/2011  . Cardiovascular risk factor 12/21/2011  . Bunion 01/31/2008  . Metatarsalgia of both feet 09/20/2007  . FLAT FOOT 09/20/2007  . HALLUX RIGIDUS, ACQUIRED 09/20/2007    Judy Spears, PT 12/08/2015, 5:23 PM  [facility] [address] [place], [place], [address] Phone: [phone]   Fax:  [phone]  Name: Judy Spears MRN: [id number] Date of Birth: [birth year]

## 2015-12-08 NOTE — Therapy (Signed)
[facility] [address] [place], [place], [address] Phone: [phone]   Fax:  [phone]  Occupational Therapy Treatment  Patient Details  Name: Judy Spears MRN: [id number] Date of Birth: [birth year] Referring Provider: Dr. Sharion Dove  Encounter Date: 12/08/2015      OT End of Session - 12/08/15 1606    Visit Number 31   Number of Visits 61   Date for OT Re-Evaluation 01/25/16   Authorization Type BCBS unlimited visits   OT Start Time 1402   OT Stop Time 1446   OT Time Calculation (min) 44 min   Activity Tolerance Patient tolerated treatment well      Past Medical History:  Diagnosis Date  . Arthritis    lt great toe  . Complication of anesthesia    pt has had headaches post op-did advise to hydra well preop  . Hair loss    right-sided  . History of exercise stress test    a. ETT (12/15) with 12:00 exercise, no ST changes, occasional PACs.   . Hyperlipidemia   . Insomnia   . Migraine headache   . 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 Postmenopausal   . Premature atrial contractions    a. Holter (12/15) with 8% PACs, no atrial runs or atrial fibrillation.     Past Surgical History:  Procedure Laterality Date  . BREAST BIOPSY  01/01/2011   Procedure: BREAST BIOPSY WITH NEEDLE LOCALIZATION;  Surgeon: Rolm Bookbinder, MD;  Location: [facility];  Service: General;  Laterality: Left;  left breast wire localization biopsy  . cataracts right eye    . CESAREAN SECTION  1986  . HERNIA REPAIR  2000   RIH  . opirectinal membrane peel    . RHINOPLASTY  1983    There were no vitals filed for this visit.      Subjective Assessment - 12/08/15 1407    Subjective  Hi Judy Spears (whisper)   Patient is accompained by: --  caregiver   Pertinent History see epic snapshot - ABI/hypoxia from tic bite/ virus   Patient Stated Goals Pt unable to state.    Currently in Pain? No/denies                       OT Treatments/Exercises (OP) - 12/08/15 0001      ADLs   Functional Mobility Addressed sliding board transfers wheelchair to mat - with verbal and tactile cueing as well as max vc's pt able to initiate coming forward as well as leaning to one side for placement of board with min a.  Pt able to stay foward during transfer however not able to intiate small lift during transfer - pt max a for transfer today.       Neurological Re-education Exercises   Other Exercises 1 Neuro re ed to address sitting balance, forward lean, functional use of RUE on mat as well as in steady assist today.  Also addressed squat to stand in steady assist - pt able to initiate this several times however fatigues quickly with repetition. Pt needed min a from raised mat to lift in order to get seat under her;  initially from steady assist then able to go  from squat to sit with cues only x3 times (pt does not achieve full upright stand howeve is able to lift bottom off seat with heavy use of arms).  After three trials pt required more  assist due to fatigue.  Pt also able to vocalize 4 words today while in steady assist with help to occlude where trach was.  Pt very alert today.                  OT Short Term Goals - 12/08/15 1603      OT SHORT TERM GOAL #1   Title Pt and husband will be mod I with HEP - 12/18/2015   Status On-going     OT SHORT TERM GOAL #2   Title Pt will demonstrate adequate postural control for breath support to voice at least 2 words   Status Achieved     OT SHORT TERM GOAL #3   Title Pt will transfer to commode with moderate assistance 25% of the time.   Status On-going     OT SHORT TERM GOAL #4   Title Pt will tolerate positional changes as evidenced by respiration rate and motor quieting in preparation for funcitonal mobility   Status Achieved           OT Long Term Goals - 12/08/15 1604      OT LONG TERM GOAL #1   Title Pt and husband will  be mod I with upgraded HEP - 01/15/2016   Status On-going     OT LONG TERM GOAL #2   Title Pt will demonstrate adequate postural control to support 3 words in supported sitting.   Status On-going     OT LONG TERM GOAL #3   Title Pt will demonstrate ability for low reach with RUE for functional task with  mod facilitation   Status Achieved     OT LONG TERM GOAL #4   Title Pt will wash face with LUE with min a in supported sitting   Status Achieved     OT LONG TERM GOAL #5   Title Pt will release objects with L hand with min faciltation    Status Achieved     OT LONG TERM GOAL #6   Title Pt will stand with min a while caregiver manages pants during LB dressing 75% of the time.   Status On-going     OT LONG TERM GOAL #7   Title Pt will transfer to commode with mod a 50% of the time.   Status On-going     OT LONG TERM GOAL #8   Title Pt will be able to sit unsupported with supervision 50% of the time while engaged in functional task.   Status On-going               Plan - 12/08/15 1604    Clinical Impression Statement Pt more consistently alert and carrying over some simple parts of actiivities. Pt improving in being active in steady assist    Rehab Potential Fair   Clinical Impairments Affecting Rehab Potential severity of deficits, slow rate of recovery   OT Frequency 3x / week   OT Duration Other (comment)  3x/wk x4 weeks then 2x/wk x4 weeks   OT Treatment/Interventions Self-care/ADL training;Ultrasound;Moist Heat;Electrical Stimulation;DME and/or AE instruction;Neuromuscular education;Therapeutic exercise;Functional Mobility Training;Manual Therapy;Passive range of motion;Splinting;Therapeutic activities;Balance training;Patient/family education;Visual/perceptual remediation/compensation;Cognitive remediation/compensation   Plan sitting balance, functional use of BUE's, sit to partial stand, partical stand to sit, stand tolerance in steady assist   Consulted and Agree  with Plan of Care Patient;Family member/caregiver   Family Member Consulted caregiver Judy Spears      Patient will benefit from skilled therapeutic intervention in order to improve the following  deficits and impairments:  Decreased endurance, Decreased skin integrity, Impaired vision/preception, Improper body mechanics, Impaired perceived functional ability, Decreased knowledge of precautions, Cardiopulmonary status limiting activity, Decreased activity tolerance, Decreased knowledge of use of DME, Decreased strength, Impaired flexibility, Improper spinal/pelvic alignment, Impaired sensation, Difficulty walking, Decreased mobility, Decreased balance, Decreased cognition, Decreased range of motion, Impaired tone, Pain, Impaired UE functional use, Decreased safety awareness, Decreased coordination  Visit Diagnosis: Abnormal posture  Unsteadiness on feet  Muscle weakness (generalized)  Other lack of coordination  Stiffness of left shoulder, not elsewhere classified  Stiffness of right shoulder, not elsewhere classified  Frontal lobe and executive function deficit  Stiffness of left elbow, not elsewhere classified    Problem List Patient Active Problem List   Diagnosis Date Noted  . Tracheostomy tube present ([facility])   . Tracheal stenosis   . Chronic respiratory failure with hypoxia ([place])   . 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[facility]) 10/20/2015  . Tracheostomy status ([facility]) 09/26/2015  . Allergic rhinitis 09/26/2015  . 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 Movement disorder 09/22/2015  . Encephalitis 09/22/2015  . Chest pain 02/07/2014  . Premature atrial contractions 01/13/2014  . Abnormality of gait 12/04/2013  . Hyperlipidemia 12/21/2011  . Cardiovascular risk factor 12/21/2011  . Bunion 01/31/2008  . Metatarsalgia of both feet 09/20/2007  . FLAT FOOT 09/20/2007  . HALLUX RIGIDUS, ACQUIRED 09/20/2007    Quay Burow, OTR/L 12/08/2015, 4:09 PM  [facility] [address] [place], [place], [address] Phone: [phone]   Fax:  [phone]  Name: Judy Spears MRN: [id number] Date of Birth: [birth year]

## 2015-12-08 NOTE — Telephone Encounter (Signed)
Please stop afternoon dose. thanks

## 2015-12-08 NOTE — Therapy (Signed)
[facility] [address] [place], [place], [address] Phone: [phone]   Fax:  [phone]  Speech Language Pathology Treatment  Patient Details  Name: Judy Spears MRN: [id number] Date of Birth: 03/06/1950 Referring Provider: Dr. Alger Simons  Encounter Date: 12/08/2015      End of Session - 12/08/15 1627    Visit Number 29   Number of Visits 36   Date for SLP Re-Evaluation 01/02/16   SLP Start Time 1535   SLP Stop Time  1615   SLP Time Calculation (min) 40 min   Activity Tolerance Patient tolerated treatment well      Past Medical History:  Diagnosis Date  . Arthritis    lt great toe  . Complication of anesthesia    pt has had headaches post op-did advise to hydra well preop  . Hair loss    right-sided  . History of exercise stress test    a. ETT (12/15) with 12:00 exercise, no ST changes, occasional PACs.   . Hyperlipidemia   . Insomnia   . Migraine headache   . 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 Postmenopausal   . Premature atrial contractions    a. Holter (12/15) with 8% PACs, no atrial runs or atrial fibrillation.     Past Surgical History:  Procedure Laterality Date  . BREAST BIOPSY  01/01/2011   Procedure: BREAST BIOPSY WITH NEEDLE LOCALIZATION;  Surgeon: Rolm Bookbinder, MD;  Location: [facility];  Service: General;  Laterality: Left;  left breast wire localization biopsy  . cataracts right eye    . CESAREAN SECTION  1986  . HERNIA REPAIR  2000   RIH  . opirectinal membrane peel    . RHINOPLASTY  1983    There were no vitals filed for this visit.      Subjective Assessment - 12/08/15 1620    Subjective "I'm good."   Patient is accompained by: --  caregiver, [name]   Currently in Pain? No/denies               ADULT SLP TREATMENT - 12/08/15 1621      General Information   Behavior/Cognition Alert;Cooperative;Pleasant mood   Patient  Positioning Upright in chair  and stedy     Treatment Provided   Treatment provided Cognitive-Linquistic;Dysphagia     Dysphagia Treatment   Treatment Methods Skilled observation;Upgraded PO texture trial   Patient observed directly with PO's Yes   Type of PO's observed Ice chips;Thin liquids   Feeding Total assist  secondary to increased work of being up in the [place] provided via Teaspoon   Oral Phase Signs & Symptoms Anterior loss/spillage;Prolonged bolus formation   Pharyngeal Phase Signs & Symptoms Suspected delayed swallow initiation   Type of cueing Verbal;Tactile;Visual   Amount of cueing Moderate     Cognitive-Linquistic Treatment   Treatment focused on Voice;Dysarthria   Skilled Treatment Pt initially required max A for vocalization with verbalization, but as the session progressed and as pt's interest increased in the topic, she managed 3 complete intelligible sentences. Pt required occlusion of the stoma site to maximize vocal intensity as there is still ongoing loss of air.      Assessment / Recommendations / Plan   Plan Continue with current plan of care     Progression Toward Goals   Progression toward goals Progressing toward goals  SLP Short Term Goals - 12/08/15 1629      SLP SHORT TERM GOAL #1   Title Pt will demonstrate audible phonation with words, vowels, or syllables in 4/10 trials with max A   Baseline achieved 10/30- will maintain to establish consistency   Status On-going     SLP SHORT TERM GOAL #3   Title Pt will swallow ice chip/H20 via spoon in less than 7 seconds with usual min to mod A 3/5 trials   Baseline 9/25, 10/30, 10/31   Status On-going     SLP SHORT TERM GOAL #4   Title Pt will sustain attention to therapy task for over 7 minutes with occasional min A   Status Achieved          SLP Long Term Goals - 12/08/15 1629      SLP LONG TERM GOAL #1   Title Pt will imitate one to two syllables with  voicing in upright position 50% of the time.   Time 2   Period Weeks   Status On-going     SLP LONG TERM GOAL #2   Title pt will demonstrate adequate breath support for speech 25% of the time with imitative responses and max cues usually (50-80%)   Time 2   Period Weeks   Status On-going     SLP LONG TERM GOAL #3   Title after oral care, pt will achieve initiation of oral phase of the swallow with ice chip/water trials with average <6 seconds over 4 sessions   Time 2   Period Weeks   Status On-going          Plan - 12/08/15 1628    Clinical Impression Statement Gains noted with voice and speech production. Swallowing remained variable, but no s/s aspiration noted with any trial.    Speech Therapy Frequency 3x / week   Duration 4 weeks   Treatment/Interventions Aspiration precaution training;Trials of upgraded texture/liquids;Compensatory strategies;Functional tasks;Patient/family education;Cueing hierarchy;Diet toleration management by SLP;Cognitive reorganization;Compensatory techniques;SLP instruction and feedback;Internal/external aids;Multimodal communcation approach   Potential Considerations Ability to learn/carryover information;Co-morbidities;Severity of impairments   Consulted and Agree with Plan of Care Patient;Family member/caregiver   Family Member Consulted caregiver [name]      Patient will benefit from skilled therapeutic intervention in order to improve the following deficits and impairments:   Dysarthria and anarthria  Dysphagia, oropharyngeal phase  Cognitive communication deficit    Problem List Patient Active Problem List   Diagnosis Date Noted  . Tracheostomy tube present ([facility])   . Tracheal stenosis   . Chronic respiratory failure with hypoxia ([facility])   . 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[facility]) 10/20/2015  . Tracheostomy status ([facility]) 09/26/2015  . Allergic rhinitis 09/26/2015  . 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 Movement disorder 09/22/2015  . Encephalitis  09/22/2015  . Chest pain 02/07/2014  . Premature atrial contractions 01/13/2014  . Abnormality of gait 12/04/2013  . Hyperlipidemia 12/21/2011  . Cardiovascular risk factor 12/21/2011  . Bunion 01/31/2008  . Metatarsalgia of both feet 09/20/2007  . FLAT FOOT 09/20/2007  . HALLUX RIGIDUS, ACQUIRED 09/20/2007    Vinetta Bergamo MA, CCC-SLP 12/08/2015, 4:30 PM  [facility] [address] [place], [place], [address] Phone: [phone]   Fax:  [phone]   Name: KETIA SCHURING MRN: [id number] Date of Birth: [birth year]

## 2015-12-08 NOTE — Telephone Encounter (Signed)
Left message for Mr Waner.

## 2015-12-09 ENCOUNTER — Ambulatory Visit: Payer: BC Managed Care – PPO | Admitting: Physical Therapy

## 2015-12-09 ENCOUNTER — Ambulatory Visit: Payer: BC Managed Care – PPO

## 2015-12-09 ENCOUNTER — Encounter: Payer: Self-pay | Admitting: Occupational Therapy

## 2015-12-09 ENCOUNTER — Ambulatory Visit: Payer: BC Managed Care – PPO | Admitting: Occupational Therapy

## 2015-12-09 DIAGNOSIS — R278 Other lack of coordination: Secondary | ICD-10-CM

## 2015-12-09 DIAGNOSIS — M25622 Stiffness of left elbow, not elsewhere classified: Secondary | ICD-10-CM

## 2015-12-09 DIAGNOSIS — R293 Abnormal posture: Secondary | ICD-10-CM

## 2015-12-09 DIAGNOSIS — R2681 Unsteadiness on feet: Secondary | ICD-10-CM

## 2015-12-09 DIAGNOSIS — M25611 Stiffness of right shoulder, not elsewhere classified: Secondary | ICD-10-CM

## 2015-12-09 DIAGNOSIS — M25612 Stiffness of left shoulder, not elsewhere classified: Secondary | ICD-10-CM

## 2015-12-09 DIAGNOSIS — R2689 Other abnormalities of gait and mobility: Secondary | ICD-10-CM | POA: Diagnosis not present

## 2015-12-09 DIAGNOSIS — R1312 Dysphagia, oropharyngeal phase: Secondary | ICD-10-CM

## 2015-12-09 DIAGNOSIS — R41844 Frontal lobe and executive function deficit: Secondary | ICD-10-CM

## 2015-12-09 DIAGNOSIS — M6281 Muscle weakness (generalized): Secondary | ICD-10-CM

## 2015-12-09 DIAGNOSIS — R471 Dysarthria and anarthria: Secondary | ICD-10-CM

## 2015-12-09 DIAGNOSIS — M25652 Stiffness of left hip, not elsewhere classified: Secondary | ICD-10-CM

## 2015-12-09 DIAGNOSIS — R41841 Cognitive communication deficit: Secondary | ICD-10-CM

## 2015-12-09 NOTE — Therapy (Signed)
[facility] [address] [place], [place], [address] Phone: [phone]   Fax:  [phone]  Occupational Therapy Treatment  Patient Details  Name: Judy Spears MRN: [id number] Date of Birth: July 29, 1950 Referring Provider: Dr. Sharion Dove  Encounter Date: 12/09/2015      OT End of Session - 12/09/15 1652    Visit Number 32   Number of Visits 39   Date for OT Re-Evaluation 01/25/16   Authorization Type BCBS unlimited visits   OT Start Time 1401   OT Stop Time 1445   OT Time Calculation (min) 44 min   Activity Tolerance Patient tolerated treatment well      Past Medical History:  Diagnosis Date  . Arthritis    lt great toe  . Complication of anesthesia    pt has had headaches post op-did advise to hydra well preop  . Hair loss    right-sided  . History of exercise stress test    a. ETT (12/15) with 12:00 exercise, no ST changes, occasional PACs.   . Hyperlipidemia   . Insomnia   . Migraine headache   . 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 Postmenopausal   . Premature atrial contractions    a. Holter (12/15) with 8% PACs, no atrial runs or atrial fibrillation.     Past Surgical History:  Procedure Laterality Date  . BREAST BIOPSY  01/01/2011   Procedure: BREAST BIOPSY WITH NEEDLE LOCALIZATION;  Surgeon: Rolm Bookbinder, MD;  Location: [facility];  Service: General;  Laterality: Left;  left breast wire localization biopsy  . cataracts right eye    . CESAREAN SECTION  1986  . HERNIA REPAIR  2000   RIH  . opirectinal membrane peel    . RHINOPLASTY  1983    There were no vitals filed for this visit.      Subjective Assessment - 12/09/15 1645    Subjective  Smiled and said WOW    Pertinent History see epic snapshot - ABI/hypoxia from tic bite/ virus   Patient Stated Goals Pt unable to state.    Currently in Pain? No/denies                      OT  Treatments/Exercises (OP) - 12/09/15 0001      ADLs   Functional Mobility Addressed toilet transfers and clothing mgmt using steady assist with pt. Pt able to pull into half standing once seated on steady assist without assistance today and hold balance while caregiver managed pants up and down. Pt also able to assist in going from steady assist to toilet seat (min a).  Max assist to go from toilet seat back to seat for steady assist .  Discussed using raised toilet seat at home as pt's toilet is very low.      Neurological Re-education Exercises   Other Exercises 1 After repetiition in using steady assist to pull to stand. Pt able to come to to half standing from raised surface with min a x2 today three times.  Pt also able to stand more erect today with improved thoracic extension as well as improved LE extension.  Pt also able to to stand with min a x2 for transfer to wheelchair and with facilitation pivot R foot.                    OT Short Term Goals - 12/09/15 1650  OT SHORT TERM GOAL #1   Title Pt and husband will be mod I with HEP - 12/18/2015   Status On-going     OT SHORT TERM GOAL #2   Title Pt will demonstrate adequate postural control for breath support to voice at least 2 words   Status Achieved     OT SHORT TERM GOAL #3   Title Pt will transfer to commode with moderate assistance 25% of the time.   Status On-going     OT SHORT TERM GOAL #4   Title Pt will tolerate positional changes as evidenced by respiration rate and motor quieting in preparation for funcitonal mobility   Status Achieved           OT Long Term Goals - 12/09/15 1650      OT LONG TERM GOAL #1   Title Pt and husband will be mod I with upgraded HEP - 01/15/2016   Status On-going     OT LONG TERM GOAL #2   Title Pt will demonstrate adequate postural control to support 3 words in supported sitting.   Status On-going     OT LONG TERM GOAL #3   Title Pt will demonstrate ability for  low reach with RUE for functional task with  mod facilitation   Status Achieved     OT LONG TERM GOAL #4   Title Pt will wash face with LUE with min a in supported sitting   Status Achieved     OT LONG TERM GOAL #5   Title Pt will release objects with L hand with min faciltation    Status Achieved     OT LONG TERM GOAL #6   Title Pt will stand with min a while caregiver manages pants during LB dressing 75% of the time.   Status On-going     OT LONG TERM GOAL #7   Title Pt will transfer to commode with mod a 50% of the time.   Status On-going     OT LONG TERM GOAL #8   Title Pt will be able to sit unsupported with supervision 50% of the time while engaged in functional task.   Status On-going               Plan - 12/09/15 1650    Clinical Impression Statement Pt with improved ability today to assist with sit to half stand in steady assist as well as during transfer to chair.    Rehab Potential Fair   Clinical Impairments Affecting Rehab Potential severity of deficits, slow rate of recovery   OT Frequency 3x / week   OT Duration Other (comment)  3x/wk x4 weeks then 2x/wk x4 weeks   OT Treatment/Interventions Self-care/ADL training;Ultrasound;Moist Heat;Electrical Stimulation;DME and/or AE instruction;Neuromuscular education;Therapeutic exercise;Functional Mobility Training;Manual Therapy;Passive range of motion;Splinting;Therapeutic activities;Balance training;Patient/family education;Visual/perceptual remediation/compensation;Cognitive remediation/compensation   Plan sit to stand, stand to sit, sitting balance, functional use of BUE's, standing tolerance in steady assist   Consulted and Agree with Plan of Care Patient;Family member/caregiver   Family Member Consulted caregiver [name],husband [name]      Patient will benefit from skilled therapeutic intervention in order to improve the following deficits and impairments:  Decreased endurance, Decreased skin integrity, Impaired  vision/preception, Improper body mechanics, Impaired perceived functional ability, Decreased knowledge of precautions, Cardiopulmonary status limiting activity, Decreased activity tolerance, Decreased knowledge of use of DME, Decreased strength, Impaired flexibility, Improper spinal/pelvic alignment, Impaired sensation, Difficulty walking, Decreased mobility, Decreased balance, Decreased cognition, Decreased range of motion, Impaired  tone, Pain, Impaired UE functional use, Decreased safety awareness, Decreased coordination  Visit Diagnosis: Muscle weakness (generalized)  Abnormal posture  Unsteadiness on feet  Other lack of coordination  Stiffness of left shoulder, not elsewhere classified  Stiffness of right shoulder, not elsewhere classified  Frontal lobe and executive function deficit  Stiffness of left elbow, not elsewhere classified    Problem List Patient Active Problem List   Diagnosis Date Noted  . Tracheostomy tube present ([facility])   . Tracheal stenosis   . Chronic respiratory failure with hypoxia ([facility])   . 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[facility]) 10/20/2015  . Tracheostomy status ([facility]) 09/26/2015  . Allergic rhinitis 09/26/2015  . 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 Movement disorder 09/22/2015  . Encephalitis 09/22/2015  . Chest pain 02/07/2014  . Premature atrial contractions 01/13/2014  . Abnormality of gait 12/04/2013  . Hyperlipidemia 12/21/2011  . Cardiovascular risk factor 12/21/2011  . Bunion 01/31/2008  . Metatarsalgia of both feet 09/20/2007  . FLAT FOOT 09/20/2007  . HALLUX RIGIDUS, ACQUIRED 09/20/2007    Quay Burow, OTR/L 12/09/2015, 4:53 PM  [facility] [address] [place], [place], [address] Phone: [phone]   Fax:  [phone]  Name: TONETTE BUTCH MRN: [id number] Date of Birth: 1950/12/04

## 2015-12-09 NOTE — Therapy (Signed)
[facility] [address] [place], [place], [address] Phone: [phone]   Fax:  [phone]  Speech Language Pathology Treatment  Patient Details  Name: Judy Spears MRN: [id number] Date of Birth: 10-Nov-1950 Referring Provider: Dr. Alger Simons  Encounter Date: 12/09/2015      End of Session - 12/09/15 1643    Visit Number 30   Number of Visits 36   Date for SLP Re-Evaluation 01/02/16   SLP Start Time 1447   SLP Stop Time  [id number]   SLP Time Calculation (min) 43 min   Activity Tolerance Patient tolerated treatment well      Past Medical History:  Diagnosis Date  . Arthritis    lt great toe  . Complication of anesthesia    pt has had headaches post op-did advise to hydra well preop  . Hair loss    right-sided  . History of exercise stress test    a. ETT (12/15) with 12:00 exercise, no ST changes, occasional PACs.   . Hyperlipidemia   . Insomnia   . Migraine headache   . 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 Postmenopausal   . Premature atrial contractions    a. Holter (12/15) with 8% PACs, no atrial runs or atrial fibrillation.     Past Surgical History:  Procedure Laterality Date  . BREAST BIOPSY  01/01/2011   Procedure: BREAST BIOPSY WITH NEEDLE LOCALIZATION;  Surgeon: Rolm Bookbinder, MD;  Location: [facility];  Service: General;  Laterality: Left;  left breast wire localization biopsy  . cataracts right eye    . CESAREAN SECTION  1986  . HERNIA REPAIR  2000   RIH  . opirectinal membrane peel    . RHINOPLASTY  1983    There were no vitals filed for this visit.      Subjective Assessment - 12/09/15 1451    Subjective (aphonic) "Hi, Carl"   Patient is accompained by: [name], [name]               ADULT SLP TREATMENT - 12/09/15 1630      General Information   Behavior/Cognition Cooperative;Pleasant mood;Lethargic  somnolent from PT/OT - alert for approx 65%  of session     Treatment Provided   Treatment provided Cognitive-Linquistic;Dysphagia     Dysphagia Treatment   Temperature Spikes Noted No   Respiratory Status Room air   Oral Cavity - Dentition Adequate natural dentition   Treatment Methods Skilled observation;Therapeutic exercise   Patient observed directly with PO's Yes   Type of PO's observed Ice chips   Feeding Total assist   Oral Phase Signs & Symptoms Anterior loss/spillage;Prolonged bolus formation;Oral holding   Pharyngeal Phase Signs & Symptoms Suspected delayed swallow initiation   Type of cueing Verbal;Tactile;Visual   Amount of cueing Moderate   Other treatment/comments Family OK'd work with ice chips despite no oral care in last 30 minutes. With presentation of ice chips, anterior leakage seen 5/6 swallows, both during mastication and/or with delay in triggering oral stage, and/or post swallow, with cue from SLP to keep lips closed and swallow. SLP cued pt to swallow with effort with each ice chip bolus, and pt appeared to do so 2/6 swallows (first swallow, fifth swallow). Delays in the oral stage of the swallow from 7-13 seconds were seen.      Cognitive-Linquistic Treatment   Treatment focused on Voice;Dysarthria   Skilled Treatment Pt's audible voice was heard  today upon entering ST room, low volume and squeaky at times. Pt answered questions with aphonia 75%, but slowed down rate when asked by SLP, with a re-attempt at verbal communication. Pt voice heard both in seated and supine positions. Pt answered questions (simple and/or personal) 90% success, when alert.     Assessment / Recommendations / Plan   Plan Continue with current plan of care            SLP Short Term Goals - 12/09/15 1648      SLP SHORT TERM GOAL #1   Title Pt will demonstrate audible phonation with words, vowels, or syllables in 4/10 trials with max A over three sessions   Baseline 10/30,   Status Revised     SLP SHORT TERM GOAL #2   Title  Pt will demonstrate breath support with max A, prior to repetition of words 4/10   Status Achieved     SLP SHORT TERM GOAL #3   Title Pt will swallow ice chip/H20 via spoon in less than 7 seconds average with usual min to mod A 3/5 trials   Baseline 9/25, 10/30, 10/31   Status On-going     SLP SHORT TERM GOAL #4   Title Pt will sustain attention to therapy task for over 7 minutes with occasional min A   Status Achieved          SLP Long Term Goals - 12/09/15 1700      SLP LONG TERM GOAL #1   Title Pt will imitate one to two syllables with voicing in upright position 50% of the time.   Time 2   Period Weeks   Status On-going     SLP LONG TERM GOAL #2   Title pt will demonstrate adequate breath support for speech 25% of the time with imitative responses and max cues usually (50-80%)   Time 2   Period Weeks   Status On-going     SLP LONG TERM GOAL #3   Title after oral care, pt will achieve initiation of oral phase of the swallow with ice chip/water trials with average <6 seconds over 4 sessions   Time 2   Period Weeks   Status On-going          Plan - 12/09/15 1644    Clinical Impression Statement Pt with more sleepiness today after Steady Stander in PT/OT and transfer work with sit<->stand, stand<->sit. With light occlusion of the stoma, pt able to achieve soft but audible voicing approx 30% of the time with answers/responses to simple questions, and comments. Swallowing today generated multiple instances of anterior labial leakage. Skilled ST is needed to continue to work for functional gains in expressive communication, and in swallowing skills to ready pt for modified barium swallow.   Speech Therapy Frequency 3x / week   Duration 4 weeks   Treatment/Interventions Aspiration precaution training;Trials of upgraded texture/liquids;Compensatory strategies;Functional tasks;Patient/family education;Cueing hierarchy;Diet toleration management by SLP;Cognitive  reorganization;Compensatory techniques;SLP instruction and feedback;Internal/external aids;Multimodal communcation approach   Potential Considerations Ability to learn/carryover information;Co-morbidities;Severity of impairments   Consulted and Agree with Plan of Care Patient;Family member/caregiver   Family Member Consulted caregiver [name]      Patient will benefit from skilled therapeutic intervention in order to improve the following deficits and impairments:   Dysphagia, oropharyngeal phase  Dysarthria and anarthria  Cognitive communication deficit    Problem List Patient Active Problem List   Diagnosis Date Noted  . Tracheostomy tube present ([facility])   . Tracheal  stenosis   . Chronic respiratory failure with hypoxia ([place])   . 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 Tracheostomy status ([facility]) 09/26/2015  . Allergic rhinitis 09/26/2015  . 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 Movement disorder 09/22/2015  . Encephalitis 09/22/2015  . Chest pain 02/07/2014  . Premature atrial contractions 01/13/2014  . Abnormality of gait 12/04/2013  . Hyperlipidemia 12/21/2011  . Cardiovascular risk factor 12/21/2011  . Bunion 01/31/2008  . Metatarsalgia of both feet 09/20/2007  . FLAT FOOT 09/20/2007  . HALLUX RIGIDUS, ACQUIRED 09/20/2007    [facility] ,MS, CCC-SLP  12/09/2015, 5:03 PM  [facility] [address] [place], [place], [address] Phone: [phone]   Fax:  [phone]   Name: CRYSTALINA AVALLONE MRN: [id number] Date of Birth: Aug 31, 1950

## 2015-12-09 NOTE — Therapy (Signed)
[facility] [address] [place], [place], [address] Phone: [phone]   Fax:  [phone]  Physical Therapy Treatment  Patient Details  Name: Judy Spears MRN: [id number] Date of Birth: 05-05-1950 Referring Provider: Alger Simons, MD  Encounter Date: 12/09/2015      PT End of Session - 12/09/15 1825    Visit Number 33   Number of Visits 45   Date for PT Re-Evaluation 01/18/16   Authorization Type [employer]   PT Start Time 1315   PT Stop Time 1400   PT Time Calculation (min) 45 min   Equipment Utilized During Treatment Other (comment)  Stedy   Activity Tolerance Patient tolerated treatment well   Behavior During Therapy [facility] for tasks assessed/performed      Past Medical History:  Diagnosis Date  . Arthritis    lt great toe  . Complication of anesthesia    pt has had headaches post op-did advise to hydra well preop  . Hair loss    right-sided  . History of exercise stress test    a. ETT (12/15) with 12:00 exercise, no ST changes, occasional PACs.   . Hyperlipidemia   . Insomnia   . Migraine headache   . 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 Postmenopausal   . Premature atrial contractions    a. Holter (12/15) with 8% PACs, no atrial runs or atrial fibrillation.     Past Surgical History:  Procedure Laterality Date  . BREAST BIOPSY  01/01/2011   Procedure: BREAST BIOPSY WITH NEEDLE LOCALIZATION;  Surgeon: Rolm Bookbinder, MD;  Location: [facility];  Service: General;  Laterality: Left;  left breast wire localization biopsy  . cataracts right eye    . CESAREAN SECTION  1986  . HERNIA REPAIR  2000   RIH  . opirectinal membrane peel    . RHINOPLASTY  1983    There were no vitals filed for this visit.      Subjective Assessment - 12/09/15 1822    Subjective Husband, Judy Spears, reports having purchased a Stedy lift from [employer]; should receive soon. Caregiver reports pt was c/o LLE  numbness today when seated on toilet. When asked if numbness still present, pt replied "no" but did reply "yes" when asked if LLE still hurts sometimes.    Patient is accompained by: Family member  caregiver Judy Spears; husband, Judy Spears   Pertinent History Precautions:  PEG, seizure. 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 No/denies  No pain at this time. Pt does report LLE pain at times.     Donned B articulating custom AFO's.   NMR: Performed sit <> partial stand from w/c using Stedy, initially requiring mod A x2 (of this PT and caregiver, Judy Spears) for BLE activation during initial 50% of transition. Transitioned performed blocked practice of graded sit <> stand using Stedy from partially lower mat table height with mod to max A of 1 therapist; cueing for full anterior weight shift with pt requiring progressively less assist to initiate, despite fatigue. Once in standing, pt performed static standing x1-3 minutes consecutively with PT verbal, tactile, and visual cueing (visual target of bringing belly button to anterior bar of Stedy)  for increased active B hip/knee extension. Note that postural alignment grossly midline (no cueing required) while pt actively standing using Stedy and when pt seated on Stedy seat. Session ended with pt seated EOM with BLE support and OT present for next session.                  [facility] Adult PT Treatment/Exercise - 12/08/15 1712      Transfers   Transfers Sit to Stand;Stand to Sit;Squat Pivot Transfers   Sit to Stand 2: Max assist;3: Mod assist;Multiple attempts;From elevated surface  from hi/low mat table to Stedy   Sit to Stand Details Verbal cues for technique;Verbal cues  for sequencing;Manual facilitation for weight shifting;Tactile cues for initiation   Sit to Stand: Patient Percentage 30%  % varies with performance and repetition                  PT Short Term Goals - 11/19/15 2029      PT SHORT TERM GOAL #1   Title Pt will consistently perform supine rolling to in B directions with supervision, 25% cueing to indicate increased trunk dissociation, increased independence with bed mobility.  (Target date:12/17/15)    Time 4   Period Weeks   Status New     PT SHORT TERM GOAL #2   Title Pt will consistently perform supine <> sit with min A, 25% cueing to indicate increased independence getting out of bed. (12/17/15)   Time 4   Period Weeks   Status New     PT SHORT TERM GOAL #3   Title Pt will demonstrate midline posture in static sitting with BLE support with 50% verbal cueing to indicate increased postural awareness/control.  (12/17/15)   Time 4   Period Weeks   Status New     PT SHORT TERM GOAL #4   Title Pt will consistently perform level transfers from w/c <> mat table with mod A, 50% cueing to decrease caregiver burden.  (12/17/15)   Time 4   Period Weeks   Status New     PT SHORT TERM GOAL #5   Title Pt will consistently perform sit <> stand from personal w/c with BUE support and mod A, 50% cueing to decrease caregiver burden.  (12/17/15)   Time 4   Period Weeks   Status New     Additional Short Term Goals   Additional Short Term Goals Yes     PT SHORT TERM GOAL #6   Title Pt will perform static standing with BUE support x1 minute with supervision, no overt LOB to decrease caregiver burden.  (12/17/15)   Time 4   Period Weeks   Status New     PT SHORT TERM GOAL #7   Title PT will assess current wheelchair and determine if alternative seating system would better maximize pt safety with functional mobility.  (12/17/15)   Time 4   Period Weeks   Status New           PT Long Term Goals - 11/19/15 2034      PT  LONG TERM GOAL #1   Title Pt will consistently perform supine <> sit with supervision, 25% cueing to indicate increased independence getting out of bed.   (Target: 01/14/16)   Time 8   Period Weeks   Status New     PT LONG TERM GOAL #2   Title Pt will demonstrate midline posture in static sitting with BLE support with subtle to min cueing  to indicate increased postural awareness/control. (01/14/16)   Time 8   Period Weeks   Status New     PT LONG TERM GOAL #3   Title Pt will consistently perform level transfers from w/c <> mat table with min A, 25% cueing to decrease caregiver burden.  (01/14/16)   Time 8   Period Weeks   Status New     PT LONG TERM GOAL #4   Title Pt will consistently perform sit <> stand from personal w/c with BUE support and min A, 25% cueing to decrease caregiver burden.  (01/14/16)   Time 8   Period Weeks   Status New     PT LONG TERM GOAL #5   Title PT will assess gait to determine if ambulation is safe/functional.  (01/14/16)   Time 8   Period Weeks   Status New               Plan - 12/09/15 1825    Clinical Impression Statement Session focused on facilitating pt independence performing sit > stand from personal w/c using Stedy to decreased caregiver burden during functional transfers. Pt tolerated interventions well; no overt signs of pain. Did note increased edema in BLE's (L > RLE); willc continue to monitor.   Rehab Potential Fair   Clinical Impairments Affecting Rehab Potential Positive: strong family/social support; negative: chronicity of condition   PT Frequency 3x / week   PT Duration 4 weeks  then 2x/week for 4 weeks   PT Treatment/Interventions ADLs/Self Care Home Management;DME Instruction;Gait training;Stair training;Functional mobility training;Therapeutic activities;Patient/family education;Cognitive remediation;Neuromuscular re-education;Therapeutic exercise;Balance training;Orthotic Fit/Training;Wheelchair mobility training;Manual  techniques;Splinting;Visual/perceptual remediation/compensation;Vestibular;Passive range of motion   PT Next Visit Plan Work on active leaning anteriorly with anterior pelvic tilt as able with use of physioball in front. Continue to address bed mobility; Stedy for anterior weight shift, LE activaiton, and final 50% of B hip/knee extension in standing; postural awareness/self-correction   Consulted and Agree with Plan of Care Family member/caregiver;Patient   Family Member Consulted caregiver, Judy Spears and husband, Judy Spears      Patient will benefit from skilled therapeutic intervention in order to improve the following deficits and impairments:  Abnormal gait, Decreased balance, Decreased endurance, Decreased cognition, Cardiopulmonary status limiting activity, Decreased activity tolerance, Decreased coordination, Decreased strength, Impaired flexibility, Impaired tone, Decreased mobility, Decreased skin integrity, Increased muscle spasms, Impaired vision/preception, Decreased range of motion, Impaired UE functional use, Postural dysfunction, Pain  Visit Diagnosis: Muscle weakness (generalized)  Abnormal posture  Unsteadiness on feet  Other lack of coordination  Stiffness of left hip, not elsewhere classified     Problem List Patient Active Problem List   Diagnosis Date Noted  . Tracheostomy tube present ([place])   . Tracheal stenosis   . Chronic respiratory failure with hypoxia ([place])   . 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 Tracheostomy status ([place]) 09/26/2015  . Allergic rhinitis 09/26/2015  . 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 Movement disorder 09/22/2015  . Encephalitis 09/22/2015  . Chest pain 02/07/2014  . Premature atrial contractions 01/13/2014  . Abnormality of gait 12/04/2013  . Hyperlipidemia 12/21/2011  . Cardiovascular risk factor 12/21/2011  . Bunion 01/31/2008  . Metatarsalgia of both feet 09/20/2007  . FLAT FOOT 09/20/2007  . HALLUX RIGIDUS, ACQUIRED 09/20/2007     Billie Ruddy, PT, DPT [facility] [address] [place], [place], [address] Phone: [phone]   Fax:  [phone] 12/09/15, 6:28 PM  Name: Judy Spears MRN: [id number] Date of Birth: Jul 20, 1950

## 2015-12-12 ENCOUNTER — Ambulatory Visit: Payer: BC Managed Care – PPO

## 2015-12-12 ENCOUNTER — Encounter: Payer: Self-pay | Admitting: Occupational Therapy

## 2015-12-12 ENCOUNTER — Ambulatory Visit: Payer: BC Managed Care – PPO | Admitting: Physical Therapy

## 2015-12-12 ENCOUNTER — Ambulatory Visit: Payer: BC Managed Care – PPO | Admitting: Occupational Therapy

## 2015-12-12 DIAGNOSIS — R41841 Cognitive communication deficit: Secondary | ICD-10-CM

## 2015-12-12 DIAGNOSIS — R41844 Frontal lobe and executive function deficit: Secondary | ICD-10-CM

## 2015-12-12 DIAGNOSIS — M25611 Stiffness of right shoulder, not elsewhere classified: Secondary | ICD-10-CM

## 2015-12-12 DIAGNOSIS — R2681 Unsteadiness on feet: Secondary | ICD-10-CM

## 2015-12-12 DIAGNOSIS — R278 Other lack of coordination: Secondary | ICD-10-CM

## 2015-12-12 DIAGNOSIS — R1312 Dysphagia, oropharyngeal phase: Secondary | ICD-10-CM

## 2015-12-12 DIAGNOSIS — M6281 Muscle weakness (generalized): Secondary | ICD-10-CM

## 2015-12-12 DIAGNOSIS — R293 Abnormal posture: Secondary | ICD-10-CM

## 2015-12-12 DIAGNOSIS — M25622 Stiffness of left elbow, not elsewhere classified: Secondary | ICD-10-CM

## 2015-12-12 DIAGNOSIS — M25612 Stiffness of left shoulder, not elsewhere classified: Secondary | ICD-10-CM

## 2015-12-12 DIAGNOSIS — R471 Dysarthria and anarthria: Secondary | ICD-10-CM

## 2015-12-12 DIAGNOSIS — R2689 Other abnormalities of gait and mobility: Secondary | ICD-10-CM | POA: Diagnosis not present

## 2015-12-12 NOTE — Patient Instructions (Signed)
Work on good voicing at home - cue to take big breath before answering.

## 2015-12-12 NOTE — Therapy (Signed)
[facility] [address] [place], [place], [address] Phone: [phone]   Fax:  [phone]  Speech Language Pathology Treatment  Patient Details  Name: Judy Spears MRN: [id number] Date of Birth: 1950/12/02 Referring Provider: Dr. Alger Simons  Encounter Date: 12/12/2015      End of Session - 12/12/15 1659    Visit Number 31   Number of Visits 36   Date for SLP Re-Evaluation 01/02/16   SLP Start Time 51   SLP Stop Time  1618   SLP Time Calculation (min) 44 min   Activity Tolerance Patient tolerated treatment well      Past Medical History:  Diagnosis Date  . Arthritis    lt great toe  . Complication of anesthesia    pt has had headaches post op-did advise to hydra well preop  . Hair loss    right-sided  . History of exercise stress test    a. ETT (12/15) with 12:00 exercise, no ST changes, occasional PACs.   . Hyperlipidemia   . Insomnia   . Migraine headache   . 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 Postmenopausal   . Premature atrial contractions    a. Holter (12/15) with 8% PACs, no atrial runs or atrial fibrillation.     Past Surgical History:  Procedure Laterality Date  . BREAST BIOPSY  01/01/2011   Procedure: BREAST BIOPSY WITH NEEDLE LOCALIZATION;  Surgeon: Rolm Bookbinder, MD;  Location: [facility];  Service: General;  Laterality: Left;  left breast wire localization biopsy  . cataracts right eye    . CESAREAN SECTION  1986  . HERNIA REPAIR  2000   RIH  . opirectinal membrane peel    . RHINOPLASTY  1983    There were no vitals filed for this visit.      Subjective Assessment - 12/12/15 1645    Subjective No "s"   Patient is accompained by: [name], [name]   Currently in Pain? --  no indications of pain                ADULT SLP TREATMENT - 12/12/15 1645      General Information   Behavior/Cognition Cooperative;Pleasant mood;Alert      Treatment Provided   Treatment provided Dysphagia;Cognitive-Linquistic     Dysphagia Treatment   Temperature Spikes Noted No   Respiratory Status Room air   Oral Cavity - Dentition Adequate natural dentition   Treatment Methods Skilled observation;Therapeutic exercise   Patient observed directly with PO's Yes   Type of PO's observed Ice chips  Pt received oral care via husband prior to POs   Feeding Total assist   Oral Phase Signs & Symptoms Anterior loss/spillage;Prolonged bolus formation;Oral holding   Pharyngeal Phase Signs & Symptoms Suspected delayed swallow initiation;Wet vocal quality   Other treatment/comments Pt with audible hydrophonia when working on speaking during the last 6-8 minutes of the session.  Pt unable to clear given weak cued volitional throat clear. SLP with x2 attempts x2 separate times. Pt triggered pharyngeal swallow spontaneously within 5 seconds of ceasing of mastication x10, and within average of 10 seconds of cued second swallow with consistent mod tactile and verbal cues.      Cognitive-Linquistic Treatment   Treatment focused on Voice;Dysarthria   Skilled Treatment Pt's audible voice was heard today, mostly of low volume and squeaky at times. When SLP cued pt for full breath prior to verbalization, pt  volume incr'd x1/11. Pt provided 3 items in simple divergent naming task with rare min cues, and audible voice occurred with max verbal and visual cues during this task 35% of the time. Pt answered questions (simple and/or personal) 90% success with intelligible answer (with appropriate labial movement for SLP recognition) with usual mod-max SLP assistance.     Assessment / Recommendations / Plan   Plan Continue with current plan of care     Progression Toward Goals   Progression toward goals Progressing toward goals          SLP Education - 12/12/15 1659    Education provided Yes   Education Details home tasks for speech/language    Person(s) Educated  Patient;Caregiver(s)   Methods Explanation;Demonstration   Comprehension Verbalized understanding          SLP Short Term Goals - 12/09/15 1648      SLP SHORT TERM GOAL #1   Title Pt will demonstrate audible phonation with words, vowels, or syllables in 4/10 trials with max A over three sessions   Baseline 10/30,   Status Revised     SLP SHORT TERM GOAL #2   Title Pt will demonstrate breath support with max A, prior to repetition of words 4/10   Status Achieved     SLP SHORT TERM GOAL #3   Title Pt will swallow ice chip/H20 via spoon in less than 7 seconds average with usual min to mod A 3/5 trials   Baseline 9/25, 10/30, 10/31   Status On-going     SLP SHORT TERM GOAL #4   Title Pt will sustain attention to therapy task for over 7 minutes with occasional min A   Status Achieved          SLP Long Term Goals - 12/12/15 1705      SLP LONG TERM GOAL #1   Title Pt will imitate one to two syllables with voicing in upright position 50% of the time.   Time 2   Period Weeks   Status On-going     SLP LONG TERM GOAL #2   Title pt will demonstrate adequate breath support for speech 25% of the time with imitative responses and max cues usually (50-80%)   Time 2   Period Weeks   Status On-going     SLP LONG TERM GOAL #3   Title after oral care, pt will achieve initiation of oral phase of the swallow with ice chip/water trials with average <6 seconds over 4 sessions   Baseline 12-12-15   Time 2   Period Weeks   Status On-going          Plan - 12/12/15 1703    Clinical Impression Statement Pt with improved swallow to command and improved timeliness of dry or double swallow to command. Labial leakage occurred with ice chips throughout, after the swallow. Less frequent than last session, quiet but audible phonation was achieved with mod A consistently. Pt would cont to benefit from skilled ST for improving voice consistency, swallow safety/readiness for modified or objective  swallow assessment, and other cognitive-linguistic abilities.   Speech Therapy Frequency 3x / week   Duration 4 weeks  (or 36 visits)   Treatment/Interventions Aspiration precaution training;Trials of upgraded texture/liquids;Compensatory strategies;Functional tasks;Patient/family education;Cueing hierarchy;Diet toleration management by SLP;Cognitive reorganization;Compensatory techniques;SLP instruction and feedback;Internal/external aids;Multimodal communcation approach   Potential to Achieve Goals Fair   Potential Considerations Ability to learn/carryover information;Co-morbidities;Severity of impairments   Consulted and Agree with Plan of Care Family  member/caregiver   Family Member Consulted caregiver [name]      Patient will benefit from skilled therapeutic intervention in order to improve the following deficits and impairments:   Dysphagia, oropharyngeal phase  Dysarthria and anarthria  Cognitive communication deficit    Problem List Patient Active Problem List   Diagnosis Date Noted  . Tracheostomy tube present ([place])   . Tracheal stenosis   . Chronic respiratory failure with hypoxia ([facility])   . 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[place]) 10/20/2015  . Tracheostomy status ([place]) 09/26/2015  . Allergic rhinitis 09/26/2015  . 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 Movement disorder 09/22/2015  . Encephalitis 09/22/2015  . Chest pain 02/07/2014  . Premature atrial contractions 01/13/2014  . Abnormality of gait 12/04/2013  . Hyperlipidemia 12/21/2011  . Cardiovascular risk factor 12/21/2011  . Bunion 01/31/2008  . Metatarsalgia of both feet 09/20/2007  . FLAT FOOT 09/20/2007  . HALLUX RIGIDUS, ACQUIRED 09/20/2007    [facility] ,[place], CCC-SLP  12/12/2015, 5:06 PM  [facility] [address] [place], [place], [address] Phone: [phone]   Fax:  [phone]   Name: DEMARIS WIRTZ MRN: [id number] Date of Birth: Aug 08, 1950

## 2015-12-12 NOTE — Therapy (Signed)
[facility] [address] [place], [place], [address] Phone: [phone]   Fax:  [phone]  Occupational Therapy Treatment  Patient Details  Name: Judy Spears MRN: [id number] Date of Birth: 06-29-1950 Referring Provider: Dr. Sharion Dove  Encounter Date: 12/12/2015      OT End of Session - 12/12/15 1654    Visit Number 33   Number of Visits 14   Date for OT Re-Evaluation 01/25/16   Authorization Type BCBS unlimited visits   OT Start Time 1410   OT Stop Time 1445   OT Time Calculation (min) 35 min   Activity Tolerance Patient tolerated treatment well   Behavior During Therapy [facility] for tasks assessed/performed      Past Medical History:  Diagnosis Date  . Arthritis    lt great toe  . Complication of anesthesia    pt has had headaches post op-did advise to hydra well preop  . Hair loss    right-sided  . History of exercise stress test    a. ETT (12/15) with 12:00 exercise, no ST changes, occasional PACs.   . Hyperlipidemia   . Insomnia   . Migraine headache   . 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 Postmenopausal   . Premature atrial contractions    a. Holter (12/15) with 8% PACs, no atrial runs or atrial fibrillation.     Past Surgical History:  Procedure Laterality Date  . BREAST BIOPSY  01/01/2011   Procedure: BREAST BIOPSY WITH NEEDLE LOCALIZATION;  Surgeon: Rolm Bookbinder, MD;  Location: [facility];  Service: General;  Laterality: Left;  left breast wire localization biopsy  . cataracts right eye    . CESAREAN SECTION  1986  . HERNIA REPAIR  2000   RIH  . opirectinal membrane peel    . RHINOPLASTY  1983    There were no vitals filed for this visit.      Subjective Assessment - 12/12/15 1651    Subjective  I am fine   Pertinent History see epic snapshot - ABI/hypoxia from tic bite/ virus   Patient Stated Goals Pt unable to state.    Currently in Pain? No/denies    Pain Score 0-No pain                      OT Treatments/Exercises (OP) - 12/12/15 0001      Neurological Re-education Exercises   Other Exercises 1 Worked on sit to stand from slightly elevated mat table with emphasis on maintaining contact with left heel on floor - initally manually.       Splinting   Splinting Patient's husband expressing concern regarding left elbow range of motion.  Husband brought previos bi-valved casts from home, and patient unable to wear at this time.  Discussed options, and determined that static progressive splint to increase elbow extension may be most reasonable option at this time.                    OT Short Term Goals - 12/09/15 1650      OT SHORT TERM GOAL #1   Title Pt and husband will be mod I with HEP - 12/18/2015   Status On-going     OT SHORT TERM GOAL #2   Title Pt will demonstrate adequate postural control for breath support to voice at least 2 words   Status Achieved     OT SHORT TERM GOAL #  3   Title Pt will transfer to commode with moderate assistance 25% of the time.   Status On-going     OT SHORT TERM GOAL #4   Title Pt will tolerate positional changes as evidenced by respiration rate and motor quieting in preparation for funcitonal mobility   Status Achieved           OT Long Term Goals - 12/09/15 1650      OT LONG TERM GOAL #1   Title Pt and husband will be mod I with upgraded HEP - 01/15/2016   Status On-going     OT LONG TERM GOAL #2   Title Pt will demonstrate adequate postural control to support 3 words in supported sitting.   Status On-going     OT LONG TERM GOAL #3   Title Pt will demonstrate ability for low reach with RUE for functional task with  mod facilitation   Status Achieved     OT LONG TERM GOAL #4   Title Pt will wash face with LUE with min a in supported sitting   Status Achieved     OT LONG TERM GOAL #5   Title Pt will release objects with L hand with min faciltation     Status Achieved     OT LONG TERM GOAL #6   Title Pt will stand with min a while caregiver manages pants during LB dressing 75% of the time.   Status On-going     OT LONG TERM GOAL #7   Title Pt will transfer to commode with mod a 50% of the time.   Status On-going     OT LONG TERM GOAL #8   Title Pt will be able to sit unsupported with supervision 50% of the time while engaged in functional task.   Status On-going               Plan - 12/12/15 1654    Clinical Impression Statement Patient showing improved carryover of LE activation during functional transfer sit to partial stand   Rehab Potential Fair   Clinical Impairments Affecting Rehab Potential severity of deficits, slow rate of recovery   OT Frequency 3x / week   OT Duration Other (comment)   OT Treatment/Interventions Self-care/ADL training;Ultrasound;Moist Heat;Electrical Stimulation;DME and/or AE instruction;Neuromuscular education;Therapeutic exercise;Functional Mobility Training;Manual Therapy;Passive range of motion;Splinting;Therapeutic activities;Balance training;Patient/family education;Visual/perceptual remediation/compensation;Cognitive remediation/compensation   Plan sit to stand, sitting balance, use of UE's, stand tolerance for ADL   Consulted and Agree with Plan of Care Patient;Family member/caregiver   Family Member Consulted caregiver [name],husband [name]      Patient will benefit from skilled therapeutic intervention in order to improve the following deficits and impairments:  Decreased endurance, Decreased skin integrity, Impaired vision/preception, Improper body mechanics, Impaired perceived functional ability, Decreased knowledge of precautions, Cardiopulmonary status limiting activity, Decreased activity tolerance, Decreased knowledge of use of DME, Decreased strength, Impaired flexibility, Improper spinal/pelvic alignment, Impaired sensation, Difficulty walking, Decreased mobility, Decreased balance,  Decreased cognition, Decreased range of motion, Impaired tone, Pain, Impaired UE functional use, Decreased safety awareness, Decreased coordination  Visit Diagnosis: Muscle weakness (generalized)  Abnormal posture  Unsteadiness on feet  Other lack of coordination  Stiffness of left shoulder, not elsewhere classified  Stiffness of right shoulder, not elsewhere classified  Frontal lobe and executive function deficit  Stiffness of left elbow, not elsewhere classified    Problem List Patient Active Problem List   Diagnosis Date Noted  . Tracheostomy tube present ([place])   . Tracheal  stenosis   . Chronic respiratory failure with hypoxia ([place])   . 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[place]) 10/20/2015  . Tracheostomy status ([place]) 09/26/2015  . Allergic rhinitis 09/26/2015  . 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 Movement disorder 09/22/2015  . Encephalitis 09/22/2015  . Chest pain 02/07/2014  . Premature atrial contractions 01/13/2014  . Abnormality of gait 12/04/2013  . Hyperlipidemia 12/21/2011  . Cardiovascular risk factor 12/21/2011  . Bunion 01/31/2008  . Metatarsalgia of both feet 09/20/2007  . FLAT FOOT 09/20/2007  . HALLUX RIGIDUS, ACQUIRED 09/20/2007    Mariah Milling, OTR/L 12/12/2015, 4:56 PM  [facility] [address] [place], [place], [address] Phone: [phone]   Fax:  [phone]  Name: KALYIAH WORMWOOD MRN: [id number] Date of Birth: 10/16/1950

## 2015-12-13 NOTE — Therapy (Signed)
[facility] [address] [place], [place], [address] Phone: [phone]   Fax:  [phone]  Physical Therapy Treatment  Patient Details  Name: Judy Spears MRN: [id number] Date of Birth: 06-21-1950 Referring Provider: Alger Simons, MD  Encounter Date: 12/12/2015      PT End of Session - 12/13/15 1059    Visit Number 34   Number of Visits 45   Date for PT Re-Evaluation 01/18/16   Authorization Type BCBS   PT Start Time 1450   PT Stop Time 1530   PT Time Calculation (min) 40 min   Equipment Utilized During Treatment Other (comment)  Stedy   Activity Tolerance Patient tolerated treatment well   Behavior During Therapy [facility] for tasks assessed/performed      Past Medical History:  Diagnosis Date  . Arthritis    lt great toe  . Complication of anesthesia    pt has had headaches post op-did advise to hydra well preop  . Hair loss    right-sided  . History of exercise stress test    a. ETT (12/15) with 12:00 exercise, no ST changes, occasional PACs.   . Hyperlipidemia   . Insomnia   . Migraine headache   . 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 Postmenopausal   . Premature atrial contractions    a. Holter (12/15) with 8% PACs, no atrial runs or atrial fibrillation.     Past Surgical History:  Procedure Laterality Date  . BREAST BIOPSY  01/01/2011   Procedure: BREAST BIOPSY WITH NEEDLE LOCALIZATION;  Surgeon: Rolm Bookbinder, MD;  Location: [facility];  Service: General;  Laterality: Left;  left breast wire localization biopsy  . cataracts right eye    . CESAREAN SECTION  1986  . HERNIA REPAIR  2000   RIH  . opirectinal membrane peel    . RHINOPLASTY  1983    There were no vitals filed for this visit.      Subjective Assessment - 12/13/15 1058    Subjective Pt received seated EOM accompanied by OT after finishing OT session. Pt in L forearm propping; eyes closed but easy to  awaken.   Patient is accompained by: Family member  husband, [name]; caregiver, [name]   Pertinent History Precautions:  PEG, seizure. 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 No/denies          NMR: Pt wearing B custom AFO's for all interventions. Seated EOM with BLE supported on floor, pt utilized BUE's to roll blue physioball forward toward target to promote active anterior weight shift, anterior pelvic tilt necessary for sit > stand. Performed sit <> stand from mat table with total A of Stedy, wedge under hips to promote anterior pelvic tilt, active posture. With use of wedge, pt able to stand from mat table (lowest setting of high-low table with added height of wedge) with min A, cueing for anterior weight shift. Pt required mod-max A for sit>stand from EOM using Stedy without wedge. Transitioned to sit <> stand from elevated mat table with BUE support at (stabilized) rolling walker, progressing from mod A to min A for transition from sit > stand, once in standing with BUE  support at stabilized RW, stability limited by LE "bouncing" and increased flexor tone in LLE (able to manually stabilize, but pt does avoid weight shifting to L when hypertonicity occurs, limiting standing stability). Pt consistently required mod A to facilitate anterior weight shift, B hip flexion with stand > sit to mat table with RW.                         PT Short Term Goals - 11/19/15 2029      PT SHORT TERM GOAL #1   Title Pt will consistently perform supine rolling to in B directions with supervision, 25% cueing to indicate increased trunk dissociation, increased independence with bed mobility.  (Target  date:12/17/15)    Time 4   Period Weeks   Status New     PT SHORT TERM GOAL #2   Title Pt will consistently perform supine <> sit with min A, 25% cueing to indicate increased independence getting out of bed. (12/17/15)   Time 4   Period Weeks   Status New     PT SHORT TERM GOAL #3   Title Pt will demonstrate midline posture in static sitting with BLE support with 50% verbal cueing to indicate increased postural awareness/control.  (12/17/15)   Time 4   Period Weeks   Status New     PT SHORT TERM GOAL #4   Title Pt will consistently perform level transfers from w/c <> mat table with mod A, 50% cueing to decrease caregiver burden.  (12/17/15)   Time 4   Period Weeks   Status New     PT SHORT TERM GOAL #5   Title Pt will consistently perform sit <> stand from personal w/c with BUE support and mod A, 50% cueing to decrease caregiver burden.  (12/17/15)   Time 4   Period Weeks   Status New     Additional Short Term Goals   Additional Short Term Goals Yes     PT SHORT TERM GOAL #6   Title Pt will perform static standing with BUE support x1 minute with supervision, no overt LOB to decrease caregiver burden.  (12/17/15)   Time 4   Period Weeks   Status New     PT SHORT TERM GOAL #7   Title PT will assess current wheelchair and determine if alternative seating system would better maximize pt safety with functional mobility.  (12/17/15)   Time 4   Period Weeks   Status New           PT Long Term Goals - 11/19/15 2034      PT LONG TERM GOAL #1   Title Pt will consistently perform supine <> sit with supervision, 25% cueing to indicate increased independence getting out of bed.   (Target: 01/14/16)   Time 8   Period Weeks   Status New     PT LONG TERM GOAL #2   Title Pt will demonstrate midline posture in static sitting with BLE support with subtle to min cueing to indicate increased postural awareness/control. (01/14/16)   Time 8   Period Weeks   Status New     PT  LONG TERM GOAL #3   Title Pt will consistently perform level transfers from w/c <> mat table with min A, 25% cueing to decrease caregiver burden.  (01/14/16)   Time 8   Period Weeks   Status New     PT LONG TERM GOAL #4   Title  Pt will consistently perform sit <> stand from personal w/c with BUE support and min A, 25% cueing to decrease caregiver burden.  (01/14/16)   Time 8   Period Weeks   Status New     PT LONG TERM GOAL #5   Title PT will assess gait to determine if ambulation is safe/functional.  (01/14/16)   Time 8   Period Weeks   Status New               Plan - 12/13/15 1100    Clinical Impression Statement Skilled session focused on increasing pt independence with functional transfers by promoting active anterior weight shift, LE activation. Suspect hamstring spasticity/tightness is inhibiting anterior pelvic tilt necessary for sit > stand. Transitioned from use of Stedy to sit > stand from elevated mat table with use of walker. Pt able to perform sit > stand with as little as min A, but static standing at walker limited by LE "bouncing"in B hip/knee extension.   Rehab Potential Fair   Clinical Impairments Affecting Rehab Potential Positive: strong family/social support; negative: chronicity of condition   PT Frequency 3x / week   PT Duration 4 weeks  then 2x/week for 4 weeks   PT Treatment/Interventions ADLs/Self Care Home Management;DME Instruction;Gait training;Stair training;Functional mobility training;Therapeutic activities;Patient/family education;Cognitive remediation;Neuromuscular re-education;Therapeutic exercise;Balance training;Orthotic Fit/Training;Wheelchair mobility training;Manual techniques;Splinting;Visual/perceptual remediation/compensation;Vestibular;Passive range of motion   PT Next Visit Plan Progress from use of Stedy to use of walker, as appropriate (? B PFRW). Work on active leaning anteriorly with anterior pelvic tilt as able with use of  physioball in front. Continue to address bed mobility.    Consulted and Agree with Plan of Care Family member/caregiver;Patient   Family Member Consulted caregiver, [name] and husband, [name]      Patient will benefit from skilled therapeutic intervention in order to improve the following deficits and impairments:  Abnormal gait, Decreased balance, Decreased endurance, Decreased cognition, Cardiopulmonary status limiting activity, Decreased activity tolerance, Decreased coordination, Decreased strength, Impaired flexibility, Impaired tone, Decreased mobility, Decreased skin integrity, Increased muscle spasms, Impaired vision/preception, Decreased range of motion, Impaired UE functional use, Postural dysfunction, Pain  Visit Diagnosis: Muscle weakness (generalized)  Abnormal posture  Unsteadiness on feet  Other lack of coordination     Problem List Patient Active Problem List   Diagnosis Date Noted  . Tracheostomy tube present ([place])   . Tracheal stenosis   . Chronic respiratory failure with hypoxia ([place])   . 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[place]) 10/20/2015  . Tracheostomy status ([place]) 09/26/2015  . Allergic rhinitis 09/26/2015  . 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 Movement disorder 09/22/2015  . Encephalitis 09/22/2015  . Chest pain 02/07/2014  . Premature atrial contractions 01/13/2014  . Abnormality of gait 12/04/2013  . Hyperlipidemia 12/21/2011  . Cardiovascular risk factor 12/21/2011  . Bunion 01/31/2008  . Metatarsalgia of both feet 09/20/2007  . FLAT FOOT 09/20/2007  . HALLUX RIGIDUS, ACQUIRED 09/20/2007   Billie Ruddy, PT, DPT [facility] [address] [place], [place], [address] Phone: [phone]   Fax:  [phone] 12/13/15, 11:07 AM  Name: MIKIYA FELICI MRN: [id number] Date of Birth: July 03, 1950

## 2015-12-15 ENCOUNTER — Encounter: Payer: Self-pay | Admitting: Occupational Therapy

## 2015-12-15 ENCOUNTER — Ambulatory Visit: Payer: BC Managed Care – PPO | Admitting: Physical Therapy

## 2015-12-15 ENCOUNTER — Ambulatory Visit: Payer: BC Managed Care – PPO | Admitting: Occupational Therapy

## 2015-12-15 DIAGNOSIS — R293 Abnormal posture: Secondary | ICD-10-CM

## 2015-12-15 DIAGNOSIS — R278 Other lack of coordination: Secondary | ICD-10-CM

## 2015-12-15 DIAGNOSIS — M6281 Muscle weakness (generalized): Secondary | ICD-10-CM

## 2015-12-15 DIAGNOSIS — R2689 Other abnormalities of gait and mobility: Secondary | ICD-10-CM

## 2015-12-15 DIAGNOSIS — R2681 Unsteadiness on feet: Secondary | ICD-10-CM

## 2015-12-15 DIAGNOSIS — R41844 Frontal lobe and executive function deficit: Secondary | ICD-10-CM

## 2015-12-15 DIAGNOSIS — M25622 Stiffness of left elbow, not elsewhere classified: Secondary | ICD-10-CM

## 2015-12-15 DIAGNOSIS — M25612 Stiffness of left shoulder, not elsewhere classified: Secondary | ICD-10-CM

## 2015-12-15 DIAGNOSIS — M25611 Stiffness of right shoulder, not elsewhere classified: Secondary | ICD-10-CM

## 2015-12-15 NOTE — Therapy (Signed)
[facility] [address] [place], [place], [address] Phone: [phone]   Fax:  [phone]  Occupational Therapy Treatment  Patient Details  Name: Judy Spears MRN: [id number] Date of Birth: January 12, 1951 Referring Provider: Dr. Sharion Dove  Encounter Date: 12/15/2015      OT End of Session - 12/15/15 1457    Visit Number 34   Number of Visits 13   Date for OT Re-Evaluation 01/25/16   Authorization Type BCBS unlimited visits   OT Start Time 1316   OT Stop Time 1401   OT Time Calculation (min) 45 min   Activity Tolerance Patient tolerated treatment well      Past Medical History:  Diagnosis Date  . Arthritis    lt great toe  . Complication of anesthesia    pt has had headaches post op-did advise to hydra well preop  . Hair loss    right-sided  . History of exercise stress test    a. ETT (12/15) with 12:00 exercise, no ST changes, occasional PACs.   . Hyperlipidemia   . Insomnia   . Migraine headache   . 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 Postmenopausal   . Premature atrial contractions    a. Holter (12/15) with 8% PACs, no atrial runs or atrial fibrillation.     Past Surgical History:  Procedure Laterality Date  . BREAST BIOPSY  01/01/2011   Procedure: BREAST BIOPSY WITH NEEDLE LOCALIZATION;  Surgeon: Rolm Bookbinder, MD;  Location: [facility];  Service: General;  Laterality: Left;  left breast wire localization biopsy  . cataracts right eye    . CESAREAN SECTION  1986  . HERNIA REPAIR  2000   RIH  . opirectinal membrane peel    . RHINOPLASTY  1983    There were no vitals filed for this visit.      Subjective Assessment - 12/15/15 1320    Subjective  Pt's husband - I put the steady together and it is a godsend.   Pertinent History see epic snapshot - ABI/hypoxia from tic bite/ virus   Patient Stated Goals Pt unable to state.    Currently in Pain? No/denies                       OT Treatments/Exercises (OP) - 12/15/15 0001      Neurological Re-education Exercises   Other Exercises 1 Neuro re ed to address sit to stand, stand to sit, trunk control and LE control in standing, weight shifting laterally with emphasis on standing tall and engaging LE"s, stepping with platform walker and decreasing reliance on UE's for mobility.  Pt required mod a for sit to stand today and was much more actively engaged in therapy session - improved ability to follow through with commands.  Pt's caregiver and husband report that pt is standing easier at home as well and that the steady assist is making transfers much easier.                   OT Short Term Goals - 12/15/15 1455      OT SHORT TERM GOAL #1   Title Pt and husband will be mod I with HEP - 12/18/2015   Status On-going     OT SHORT TERM GOAL #2   Title Pt will demonstrate adequate postural control for breath support to voice at least 2 words   Status Achieved  OT SHORT TERM GOAL #3   Title Pt will transfer to commode with moderate assistance 25% of the time.   Status On-going     OT SHORT TERM GOAL #4   Title Pt will tolerate positional changes as evidenced by respiration rate and motor quieting in preparation for funcitonal mobility   Status Achieved           OT Long Term Goals - 12/15/15 1455      OT LONG TERM GOAL #1   Title Pt and husband will be mod I with upgraded HEP - 01/15/2016   Status On-going     OT LONG TERM GOAL #2   Title Pt will demonstrate adequate postural control to support 3 words in supported sitting.   Status On-going     OT LONG TERM GOAL #3   Title Pt will demonstrate ability for low reach with RUE for functional task with  mod facilitation   Status Achieved     OT LONG TERM GOAL #4   Title Pt will wash face with LUE with min a in supported sitting   Status Achieved     OT LONG TERM GOAL #5   Title Pt will release objects with L  hand with min faciltation    Status Achieved     OT LONG TERM GOAL #6   Title Pt will stand with min a while caregiver manages pants during LB dressing 75% of the time.   Status On-going     OT LONG TERM GOAL #7   Title Pt will transfer to commode with mod a 50% of the time.   Status On-going     OT LONG TERM GOAL #8   Title Pt will be able to sit unsupported with supervision 50% of the time while engaged in functional task.   Status On-going               Plan - 12/15/15 1456    Clinical Impression Statement Pt with slow progress toward goals.  Pt improving in trunk control, midline sitting, following simple commands and assisting with sit to stand.    Rehab Potential Fair   Clinical Impairments Affecting Rehab Potential severity of deficits, slow rate of recovery   OT Frequency 3x / week   OT Duration Other (comment)  3x/wk x4 weeks then 2x/wk x4 weeks   OT Treatment/Interventions Self-care/ADL training;Ultrasound;Moist Heat;Electrical Stimulation;DME and/or AE instruction;Neuromuscular education;Therapeutic exercise;Functional Mobility Training;Manual Therapy;Passive range of motion;Splinting;Therapeutic activities;Balance training;Patient/family education;Visual/perceptual remediation/compensation;Cognitive remediation/compensation   Plan sit to stand, standing balance, weight shifting in standing, decreasing use of UE's for standing, stand tolerance for ADL   Consulted and Agree with Plan of Care Patient;Family member/caregiver   Family Member Consulted caregiver [name],husband [name]      Patient will benefit from skilled therapeutic intervention in order to improve the following deficits and impairments:  Decreased endurance, Decreased skin integrity, Impaired vision/preception, Improper body mechanics, Impaired perceived functional ability, Decreased knowledge of precautions, Cardiopulmonary status limiting activity, Decreased activity tolerance, Decreased knowledge of use  of DME, Decreased strength, Impaired flexibility, Improper spinal/pelvic alignment, Impaired sensation, Difficulty walking, Decreased mobility, Decreased balance, Decreased cognition, Decreased range of motion, Impaired tone, Pain, Impaired UE functional use, Decreased safety awareness, Decreased coordination  Visit Diagnosis: Muscle weakness (generalized)  Abnormal posture  Unsteadiness on feet  Other lack of coordination  Stiffness of left shoulder, not elsewhere classified  Stiffness of right shoulder, not elsewhere classified  Frontal lobe and executive function deficit  Stiffness of  left elbow, not elsewhere classified    Problem List Patient Active Problem List   Diagnosis Date Noted  . Tracheostomy tube present ([facility])   . Tracheal stenosis   . Chronic respiratory failure with hypoxia ([facility])   . 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[facility]) 10/20/2015  . Tracheostomy status ([facility]) 09/26/2015  . Allergic rhinitis 09/26/2015  . 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 Movement disorder 09/22/2015  . Encephalitis 09/22/2015  . Chest pain 02/07/2014  . Premature atrial contractions 01/13/2014  . Abnormality of gait 12/04/2013  . Hyperlipidemia 12/21/2011  . Cardiovascular risk factor 12/21/2011  . Bunion 01/31/2008  . Metatarsalgia of both feet 09/20/2007  . FLAT FOOT 09/20/2007  . HALLUX RIGIDUS, ACQUIRED 09/20/2007    Quay Burow , OTR/L 12/15/2015, 2:59 PM  [facility] [address] [place], [place], [address] Phone: [phone]   Fax:  [phone]  Name: Judy Spears MRN: [id number] Date of Birth: February 23, 1950

## 2015-12-15 NOTE — Therapy (Signed)
[facility] [address] [place], [place], [address] Phone: [phone]   Fax:  [phone]  Physical Therapy Treatment  Patient Details  Name: Judy Spears MRN: [id number] Date of Birth: 1950/05/28 Referring Provider: Alger Simons, MD  Encounter Date: 12/15/2015      PT End of Session - 12/15/15 1712    Visit Number 35   Number of Visits 45   Date for PT Re-Evaluation 01/18/16   Authorization Type BCBS   PT Start Time 1403   PT Stop Time 1445   PT Time Calculation (min) 42 min   Activity Tolerance Patient tolerated treatment well   Behavior During Therapy [facility] for tasks assessed/performed      Past Medical History:  Diagnosis Date  . Arthritis    lt great toe  . Complication of anesthesia    pt has had headaches post op-did advise to hydra well preop  . Hair loss    right-sided  . History of exercise stress test    a. ETT (12/15) with 12:00 exercise, no ST changes, occasional PACs.   . Hyperlipidemia   . Insomnia   . Migraine headache   . 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 Postmenopausal   . Premature atrial contractions    a. Holter (12/15) with 8% PACs, no atrial runs or atrial fibrillation.     Past Surgical History:  Procedure Laterality Date  . BREAST BIOPSY  01/01/2011   Procedure: BREAST BIOPSY WITH NEEDLE LOCALIZATION;  Surgeon: Rolm Bookbinder, MD;  Location: [facility];  Service: General;  Laterality: Left;  left breast wire localization biopsy  . cataracts right eye    . CESAREAN SECTION  1986  . HERNIA REPAIR  2000   RIH  . opirectinal membrane peel    . RHINOPLASTY  1983    There were no vitals filed for this visit.      Subjective Assessment - 12/15/15 1711    Subjective Pt accompanied by husband and caregiver. Received seated in standard chair after finishing OT session.    Patient is accompained by: Family member  husband, [name]; caregiver, [name]    Pertinent History Precautions:  PEG, seizure. 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 No/denies             NMR: While wearing B custom AFO's, pt performed sit <> stand with min A, cueing for full anterior weight shift. Once in standing, pt performed pre-gait activity (no AD) with B HHA of therapist positioned in front of pt (also providing multi-modal cueing for upright postural alignment); therapist positioned posterior to pt provided max manual facilitation of L hip/knee extension and maintained L foot position on floor (to prevent L knee flexion, L hip external rotation). Pt progressed from standing, M/L weight shifting to very short duration L SLS, and eventually to RLE advancement. Multimodal cueing, manual facilitation emphasized trunk alignment, lateral weight shift to L, and L stance stability. Progressed to gait training x10' with B PFRW with one therapist positioned posterior to patient (as described above) providing max manual facilitation of L hip/knee extension and maintained L foot position on floor (to prevent L knee flexion, L hip  external rotation); other therapist anterior to pt providing multimodal cueing at ribcage, head/neck, as well as assisting with LLE placement/alignment while verbally cueing for weight shifting, L stance stability, and LE advancement.                     PT Short Term Goals - 11/19/15 2029      PT SHORT TERM GOAL #1   Title Pt will consistently perform supine rolling to in B directions with supervision, 25% cueing to indicate increased trunk dissociation, increased independence with bed mobility.  (Target date:12/17/15)    Time 4   Period Weeks    Status New     PT SHORT TERM GOAL #2   Title Pt will consistently perform supine <> sit with min A, 25% cueing to indicate increased independence getting out of bed. (12/17/15)   Time 4   Period Weeks   Status New     PT SHORT TERM GOAL #3   Title Pt will demonstrate midline posture in static sitting with BLE support with 50% verbal cueing to indicate increased postural awareness/control.  (12/17/15)   Time 4   Period Weeks   Status New     PT SHORT TERM GOAL #4   Title Pt will consistently perform level transfers from w/c <> mat table with mod A, 50% cueing to decrease caregiver burden.  (12/17/15)   Time 4   Period Weeks   Status New     PT SHORT TERM GOAL #5   Title Pt will consistently perform sit <> stand from personal w/c with BUE support and mod A, 50% cueing to decrease caregiver burden.  (12/17/15)   Time 4   Period Weeks   Status New     Additional Short Term Goals   Additional Short Term Goals Yes     PT SHORT TERM GOAL #6   Title Pt will perform static standing with BUE support x1 minute with supervision, no overt LOB to decrease caregiver burden.  (12/17/15)   Time 4   Period Weeks   Status New     PT SHORT TERM GOAL #7   Title PT will assess current wheelchair and determine if alternative seating system would better maximize pt safety with functional mobility.  (12/17/15)   Time 4   Period Weeks   Status New           PT Long Term Goals - 11/19/15 2034      PT LONG TERM GOAL #1   Title Pt will consistently perform supine <> sit with supervision, 25% cueing to indicate increased independence getting out of bed.   (Target: 01/14/16)   Time 8   Period Weeks   Status New     PT LONG TERM GOAL #2   Title Pt will demonstrate midline posture in static sitting with BLE support with subtle to min cueing to indicate increased postural awareness/control. (01/14/16)   Time 8   Period Weeks   Status New     PT LONG TERM GOAL #3   Title Pt will  consistently perform level transfers from w/c <> mat table with min A, 25% cueing to decrease caregiver burden.  (01/14/16)   Time 8   Period Weeks   Status New     PT LONG TERM GOAL #4   Title Pt will consistently perform sit <> stand from personal w/c with BUE support and min A, 25% cueing to decrease caregiver burden.  (01/14/16)   Time  8   Period Weeks   Status New     PT LONG TERM GOAL #5   Title PT will assess gait to determine if ambulation is safe/functional.  (01/14/16)   Time 8   Period Weeks   Status New               Plan - 12/15/15 1713    Clinical Impression Statement Session focused on increasing L stance stability, initiating pre-gait with B PFRW. Pt tolerated interventions well; required max manual facilitation to maintain L hip/knee extension during L stance phase. When LLE aligned properly, pt able to consistently advance RLE.    Rehab Potential Fair   Clinical Impairments Affecting Rehab Potential Positive: strong family/social support; negative: chronicity of condition   PT Frequency 3x / week   PT Duration 4 weeks  then 2x/week for 4 weeks   PT Treatment/Interventions ADLs/Self Care Home Management;DME Instruction;Gait training;Stair training;Functional mobility training;Therapeutic activities;Patient/family education;Cognitive remediation;Neuromuscular re-education;Therapeutic exercise;Balance training;Orthotic Fit/Training;Wheelchair mobility training;Manual techniques;Splinting;Visual/perceptual remediation/compensation;Vestibular;Passive range of motion   PT Next Visit Plan Continue pre-gait if skilled +2A available . If no +2A, may be able to start pre-gait at // bars. Begin checking STG's, as able.    Consulted and Agree with Plan of Care Family member/caregiver;Patient   Family Member Consulted caregiver, [name] and husband, [name]      Patient will benefit from skilled therapeutic intervention in order to improve the following deficits and  impairments:  Abnormal gait, Decreased balance, Decreased endurance, Decreased cognition, Cardiopulmonary status limiting activity, Decreased activity tolerance, Decreased coordination, Decreased strength, Impaired flexibility, Impaired tone, Decreased mobility, Decreased skin integrity, Increased muscle spasms, Impaired vision/preception, Decreased range of motion, Impaired UE functional use, Postural dysfunction, Pain  Visit Diagnosis: Muscle weakness (generalized)  Abnormal posture  Other abnormalities of gait and mobility  Unsteadiness on feet     Problem List Patient Active Problem List   Diagnosis Date Noted  . Tracheostomy tube present ([facility])   . Tracheal stenosis   . Chronic respiratory failure with hypoxia ([facility])   . 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[facility]) 10/20/2015  . Tracheostomy status ([facility]) 09/26/2015  . Allergic rhinitis 09/26/2015  . 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 Movement disorder 09/22/2015  . Encephalitis 09/22/2015  . Chest pain 02/07/2014  . Premature atrial contractions 01/13/2014  . Abnormality of gait 12/04/2013  . Hyperlipidemia 12/21/2011  . Cardiovascular risk factor 12/21/2011  . Bunion 01/31/2008  . Metatarsalgia of both feet 09/20/2007  . FLAT FOOT 09/20/2007  . HALLUX RIGIDUS, ACQUIRED 09/20/2007    Billie Ruddy, PT, DPT [facility] [address] [place], [place], [address] Phone: [phone]   Fax:  [phone] 12/15/15, 5:18 PM  Name: Judy Spears MRN: [id number] Date of Birth: 1950-11-13

## 2015-12-16 ENCOUNTER — Ambulatory Visit: Payer: BC Managed Care – PPO | Admitting: Physical Therapy

## 2015-12-16 ENCOUNTER — Ambulatory Visit: Payer: BC Managed Care – PPO | Admitting: Occupational Therapy

## 2015-12-16 ENCOUNTER — Encounter: Payer: Self-pay | Admitting: Occupational Therapy

## 2015-12-16 DIAGNOSIS — R2681 Unsteadiness on feet: Secondary | ICD-10-CM

## 2015-12-16 DIAGNOSIS — M6281 Muscle weakness (generalized): Secondary | ICD-10-CM

## 2015-12-16 DIAGNOSIS — M25622 Stiffness of left elbow, not elsewhere classified: Secondary | ICD-10-CM

## 2015-12-16 DIAGNOSIS — M25611 Stiffness of right shoulder, not elsewhere classified: Secondary | ICD-10-CM

## 2015-12-16 DIAGNOSIS — R278 Other lack of coordination: Secondary | ICD-10-CM

## 2015-12-16 DIAGNOSIS — M25612 Stiffness of left shoulder, not elsewhere classified: Secondary | ICD-10-CM

## 2015-12-16 DIAGNOSIS — R2689 Other abnormalities of gait and mobility: Secondary | ICD-10-CM | POA: Diagnosis not present

## 2015-12-16 DIAGNOSIS — R41844 Frontal lobe and executive function deficit: Secondary | ICD-10-CM

## 2015-12-16 DIAGNOSIS — R293 Abnormal posture: Secondary | ICD-10-CM

## 2015-12-16 NOTE — Therapy (Signed)
[facility] [address] [place], [place], [address] Phone: [phone]   Fax:  [phone]  Physical Therapy Treatment  Patient Details  Name: Judy Spears MRN: [id number] Date of Birth: 1950-04-03 Referring Provider: Alger Simons, MD  Encounter Date: 12/16/2015      PT End of Session - 12/16/15 1027    Visit Number 36   Number of Visits 45   Date for PT Re-Evaluation 01/18/16   Authorization Type BCBS   PT Start Time 0932  pt had to be changed due to incontinence during session   PT Stop Time 1015   PT Time Calculation (min) 43 min      Past Medical History:  Diagnosis Date  . Arthritis    lt great toe  . Complication of anesthesia    pt has had headaches post op-did advise to hydra well preop  . Hair loss    right-sided  . History of exercise stress test    a. ETT (12/15) with 12:00 exercise, no ST changes, occasional PACs.   . Hyperlipidemia   . Insomnia   . Migraine headache   . 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 Postmenopausal   . Premature atrial contractions    a. Holter (12/15) with 8% PACs, no atrial runs or atrial fibrillation.     Past Surgical History:  Procedure Laterality Date  . BREAST BIOPSY  01/01/2011   Procedure: BREAST BIOPSY WITH NEEDLE LOCALIZATION;  Surgeon: Rolm Bookbinder, MD;  Location: [facility];  Service: General;  Laterality: Left;  left breast wire localization biopsy  . cataracts right eye    . CESAREAN SECTION  1986  . HERNIA REPAIR  2000   RIH  . opirectinal membrane peel    . RHINOPLASTY  1983    There were no vitals filed for this visit.      Subjective Assessment - 12/16/15 0957    Subjective Caregiver, [name], reports they used Stedy lift at home yesterday and it worked great   Patient is accompained by: --  Electrical engineer   Pertinent History Precautions:  PEG, seizure. PMH significant for: viral encephalitis due to [place]  Virus (01/02/16), acute respiratory failure, R vocal cord paralysis, tracheal stenosis s/p repair on 07/08/15, s/p Botox injections in B ankle plantarflexors and L SCM/scalenes    Patient Stated Goals Per husband, "For Judy Spears to be as independent as possible."   Currently in Pain? No/denies                         [facility] Adult PT Treatment/Exercise - 12/16/15 1022      Bed Mobility   Bed Mobility Rolling Right;Rolling Left;Sit to Supine   Rolling Right 3: Mod assist   Rolling Right Details (indicate cue type and reason) cues given at pelvis to initiate dissociation   Rolling Left 3: Mod assist   Rolling Left Details (indicate cue type and reason) tactile cues/assist given at pelvis to initiate rolling   Supine to Sit 2: Max assist     Transfers   Transfers Sit to Stand   Sit to Stand 3: Mod assist  in [place]   Sit to Stand Details Tactile cues for placement;Tactile cues for sequencing   Sit to Stand: Patient Percentage 40%   Stand to Sit 3: Mod assist  for controlled descent     Attempted sit to stand from high/low mat table with +2  assist (pt did not have braces on LE's) - 2 attempts performed  Pt performed reaching for cones in midline with RUE and handing it to PT's hand approx. 6" to R side from center - 5 cones      Balance Exercises - 12/16/15 1026      Balance Exercises: Seated   Static Sitting Eyes opened   Dynamic Sitting Eyes opened;Anterior/posterior weight shift;Lateral weight shift     Above exercise performed as moving in various directions in moving in limits of stability - mod assist with posterior weight shift, Min to CGA with lateral and anterior directions        PT Short Term Goals - 12/16/15 1001      PT SHORT TERM GOAL #3   Title Pt will demonstrate midline posture in static sitting with BLE support with 50% verbal cueing to indicate increased postural awareness/control.  (12/17/15)   Status Achieved           PT Long Term Goals  - 11/19/15 2034      PT LONG TERM GOAL #1   Title Pt will consistently perform supine <> sit with supervision, 25% cueing to indicate increased independence getting out of bed.   (Target: 01/14/16)   Time 8   Period Weeks   Status New     PT LONG TERM GOAL #2   Title Pt will demonstrate midline posture in static sitting with BLE support with subtle to min cueing to indicate increased postural awareness/control. (01/14/16)   Time 8   Period Weeks   Status New     PT LONG TERM GOAL #3   Title Pt will consistently perform level transfers from w/c <> mat table with min A, 25% cueing to decrease caregiver burden.  (01/14/16)   Time 8   Period Weeks   Status New     PT LONG TERM GOAL #4   Title Pt will consistently perform sit <> stand from personal w/c with BUE support and min A, 25% cueing to decrease caregiver burden.  (01/14/16)   Time 8   Period Weeks   Status New     PT LONG TERM GOAL #5   Title PT will assess gait to determine if ambulation is safe/functional.  (01/14/16)   Time 8   Period Weeks   Status New               Plan - 12/16/15 1029    Clinical Impression Statement Pt did well with dynamic sitting balance and transitional movements from sitting to R and L sidesitting, with more difficulty pushing up from L side than from R side; attempted partial sit to stand from mat but pt had significant difficulty with this activity- did much better performing this activity in [place] with use of arms in pulling up to stand; pt able to stand in [place] holding with LUE and reach for cone with RUE with cone in midline   Rehab Potential Fair   Clinical Impairments Affecting Rehab Potential Positive: strong family/social support; negative: chronicity of condition   PT Frequency 3x / week   PT Duration 4 weeks   PT Treatment/Interventions ADLs/Self Care Home Management;DME Instruction;Gait training;Stair training;Functional mobility training;Therapeutic activities;Patient/family  education;Cognitive remediation;Neuromuscular re-education;Therapeutic exercise;Balance training;Orthotic Fit/Training;Wheelchair mobility training;Manual techniques;Splinting;Visual/perceptual remediation/compensation;Vestibular;Passive range of motion   PT Next Visit Plan Continue pre-gait if skilled +2A available . If no +2A, may be able to start pre-gait at // bars. Begin checking STG's, as able.    Consulted and  Agree with Plan of Care Patient;Other (Comment)   Family Member Consulted caregiver, [name]      Patient will benefit from skilled therapeutic intervention in order to improve the following deficits and impairments:     Visit Diagnosis: Muscle weakness (generalized)  Other abnormalities of gait and mobility     Problem List Patient Active Problem List   Diagnosis Date Noted  . Tracheostomy tube present ([place])   . Tracheal stenosis   . Chronic respiratory failure with hypoxia ([facility] [place])   . 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[place]) 10/20/2015  . Tracheostomy status ([facility]) 09/26/2015  . Allergic rhinitis 09/26/2015  . 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 Movement disorder 09/22/2015  . Encephalitis 09/22/2015  . Chest pain 02/07/2014  . Premature atrial contractions 01/13/2014  . Abnormality of gait 12/04/2013  . Hyperlipidemia 12/21/2011  . Cardiovascular risk factor 12/21/2011  . Bunion 01/31/2008  . Metatarsalgia of both feet 09/20/2007  . FLAT FOOT 09/20/2007  . HALLUX RIGIDUS, ACQUIRED 09/20/2007    Alda Lea, PT 12/16/2015, 1:57 PM  [facility] [address] [place], [place], [address] Phone: [phone]   Fax:  [phone]  Name: JENASIS GASCA MRN: [id number] Date of Birth: [birth year]

## 2015-12-16 NOTE — Therapy (Signed)
[facility] [address] [place], [place], [address] Phone: [phone]   Fax:  [phone]  Occupational Therapy Treatment  Patient Details  Name: Judy Spears MRN: [id number] Date of Birth: 12-17-1950 Referring Provider: Dr. Sharion Dove  Encounter Date: 12/16/2015      OT End of Session - 12/16/15 1305    Visit Number 35   Number of Visits 44   Date for OT Re-Evaluation 01/25/16   Authorization Type BCBS unlimited visits   OT Start Time 0900  pt arrived late   OT Stop Time 0931   OT Time Calculation (min) 31 min   Activity Tolerance Patient tolerated treatment well      Past Medical History:  Diagnosis Date  . Arthritis    lt great toe  . Complication of anesthesia    pt has had headaches post op-did advise to hydra well preop  . Hair loss    right-sided  . History of exercise stress test    a. ETT (12/15) with 12:00 exercise, no ST changes, occasional PACs.   . Hyperlipidemia   . Insomnia   . Migraine headache   . 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 Postmenopausal   . Premature atrial contractions    a. Holter (12/15) with 8% PACs, no atrial runs or atrial fibrillation.     Past Surgical History:  Procedure Laterality Date  . BREAST BIOPSY  01/01/2011   Procedure: BREAST BIOPSY WITH NEEDLE LOCALIZATION;  Surgeon: Rolm Bookbinder, MD;  Location: [facility];  Service: General;  Laterality: Left;  left breast wire localization biopsy  . cataracts right eye    . CESAREAN SECTION  1986  . HERNIA REPAIR  2000   RIH  . opirectinal membrane peel    . RHINOPLASTY  1983    There were no vitals filed for this visit.      Subjective Assessment - 12/16/15 0901    Pertinent History (P)  see epic snapshot - ABI/hypoxia from tic bite/ virus   Patient Stated Goals (P)  Pt unable to state.    Currently in Pain? (P)  No/denies                      OT  Treatments/Exercises (OP) - 12/16/15 1257      ADLs   Functional Mobility Stand pivot transfer wheelchair to chair with max a x1 - pt with increased initiation. Addressed wheelchair mobility using manual chair on indoor even surfaces with emphasis on sustained attention, following simple commands, motor planning, use of arms and legs to propel chair, visual scanning.  Also addressed scooting forward and back in wheelchair (pt needs mod a). Caregiver to measure manual wheelchairs at home to determine size.              Balance Exercises - 12/16/15 1026      Balance Exercises: Seated   Static Sitting Eyes opened   Dynamic Sitting Eyes opened;Anterior/posterior weight shift;Lateral weight shift             OT Short Term Goals - 12/16/15 1300      OT SHORT TERM GOAL #1   Title Pt and husband will be mod I with HEP - 12/18/2015   Status On-going     OT SHORT TERM GOAL #2   Title Pt will demonstrate adequate postural control for breath support to voice at least 2 words   Status Achieved  OT SHORT TERM GOAL #3   Title Pt will transfer to commode with moderate assistance 25% of the time.   Status On-going     OT SHORT TERM GOAL #4   Title Pt will tolerate positional changes as evidenced by respiration rate and motor quieting in preparation for funcitonal mobility   Status Achieved           OT Long Term Goals - 12/16/15 1301      OT LONG TERM GOAL #1   Title Pt and husband will be mod I with upgraded HEP - 01/15/2016   Status On-going     OT LONG TERM GOAL #2   Title Pt will demonstrate adequate postural control to support 3 words in supported sitting.   Status On-going     OT LONG TERM GOAL #3   Title Pt will demonstrate ability for low reach with RUE for functional task with  mod facilitation   Status Achieved     OT LONG TERM GOAL #4   Title Pt will wash face with LUE with min a in supported sitting   Status Achieved     OT LONG TERM GOAL #5   Title Pt  will release objects with L hand with min faciltation    Status Achieved     OT LONG TERM GOAL #6   Title Pt will stand with min a while caregiver manages pants during LB dressing 75% of the time.   Status On-going     OT LONG TERM GOAL #7   Title Pt will transfer to commode with mod a 50% of the time.   Status On-going     OT LONG TERM GOAL #8   Title Pt will be able to sit unsupported with supervision 50% of the time while engaged in functional task.   Status On-going               Plan - 12/16/15 1302    Clinical Impression Statement Pt with improved alertness overall and improved ability to engage in therapy. Pt also slowly improving in functional mobility.    Rehab Potential Fair   Clinical Impairments Affecting Rehab Potential severity of deficits, slow rate of recovery   OT Frequency 3x / week   OT Duration Other (comment)   OT Treatment/Interventions Self-care/ADL training;Ultrasound;Moist Heat;Electrical Stimulation;DME and/or AE instruction;Neuromuscular education;Therapeutic exercise;Functional Mobility Training;Manual Therapy;Passive range of motion;Splinting;Therapeutic activities;Balance training;Patient/family education;Visual/perceptual remediation/compensation;Cognitive remediation/compensation   Plan sit to stand, standing balance, weight shiftin in standing, decreasing use of UE's for standing, stand tolerance for ADL   Consulted and Agree with Plan of Care Patient;Family member/caregiver   Family Member Consulted caregiver [name],      Patient will benefit from skilled therapeutic intervention in order to improve the following deficits and impairments:     Visit Diagnosis: Muscle weakness (generalized)  Abnormal posture  Unsteadiness on feet  Other lack of coordination  Stiffness of left shoulder, not elsewhere classified  Stiffness of right shoulder, not elsewhere classified  Frontal lobe and executive function deficit  Stiffness of left elbow,  not elsewhere classified    Problem List Patient Active Problem List   Diagnosis Date Noted  . Tracheostomy tube present ([facility])   . Tracheal stenosis   . Chronic respiratory failure with hypoxia ([facility])   . 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[facility]) 10/20/2015  . Tracheostomy status ([place]) 09/26/2015  . Allergic rhinitis 09/26/2015  . 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 Movement disorder 09/22/2015  . Encephalitis 09/22/2015  . Chest pain  02/07/2014  . Premature atrial contractions 01/13/2014  . Abnormality of gait 12/04/2013  . Hyperlipidemia 12/21/2011  . Cardiovascular risk factor 12/21/2011  . Bunion 01/31/2008  . Metatarsalgia of both feet 09/20/2007  . FLAT FOOT 09/20/2007  . HALLUX RIGIDUS, ACQUIRED 09/20/2007    Quay Burow, OTR/L 12/16/2015, 1:07 PM  [facility] [address] [place], [place], [address] Phone: [phone]   Fax:  [phone]  Name: Judy Spears MRN: [id number] Date of Birth: [birth year]

## 2015-12-17 ENCOUNTER — Ambulatory Visit: Payer: BC Managed Care – PPO | Admitting: Occupational Therapy

## 2015-12-17 ENCOUNTER — Ambulatory Visit: Payer: BC Managed Care – PPO | Admitting: Physical Therapy

## 2015-12-17 ENCOUNTER — Encounter: Payer: Self-pay | Admitting: Occupational Therapy

## 2015-12-17 DIAGNOSIS — M25612 Stiffness of left shoulder, not elsewhere classified: Secondary | ICD-10-CM

## 2015-12-17 DIAGNOSIS — R2681 Unsteadiness on feet: Secondary | ICD-10-CM

## 2015-12-17 DIAGNOSIS — R293 Abnormal posture: Secondary | ICD-10-CM

## 2015-12-17 DIAGNOSIS — R278 Other lack of coordination: Secondary | ICD-10-CM

## 2015-12-17 DIAGNOSIS — M25652 Stiffness of left hip, not elsewhere classified: Secondary | ICD-10-CM

## 2015-12-17 DIAGNOSIS — R2689 Other abnormalities of gait and mobility: Secondary | ICD-10-CM | POA: Diagnosis not present

## 2015-12-17 DIAGNOSIS — M25611 Stiffness of right shoulder, not elsewhere classified: Secondary | ICD-10-CM

## 2015-12-17 DIAGNOSIS — M25622 Stiffness of left elbow, not elsewhere classified: Secondary | ICD-10-CM

## 2015-12-17 DIAGNOSIS — M6281 Muscle weakness (generalized): Secondary | ICD-10-CM

## 2015-12-17 NOTE — Therapy (Signed)
[facility] [address] [place], [place], [address] Phone: [phone]   Fax:  [phone]  Occupational Therapy Treatment  Patient Details  Name: Judy Spears MRN: [id number] Date of Birth: [birth year] Referring Provider: Dr. Sharion Dove  Encounter Date: 12/17/2015      OT End of Session - 12/17/15 1645    Visit Number 36   Number of Visits 74   Date for OT Re-Evaluation 01/25/16   Authorization Type BCBS unlimited visits   OT Start Time [id number]   OT Stop Time 1530   OT Time Calculation (min) 45 min   Activity Tolerance Patient tolerated treatment well   Behavior During Therapy [facility] for tasks assessed/performed      Past Medical History:  Diagnosis Date  . Arthritis    lt great toe  . Complication of anesthesia    pt has had headaches post op-did advise to hydra well preop  . Hair loss    right-sided  . History of exercise stress test    a. ETT (12/15) with 12:00 exercise, no ST changes, occasional PACs.   . Hyperlipidemia   . Insomnia   . Migraine headache   . 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 Postmenopausal   . Premature atrial contractions    a. Holter (12/15) with 8% PACs, no atrial runs or atrial fibrillation.     Past Surgical History:  Procedure Laterality Date  . BREAST BIOPSY  01/01/2011   Procedure: BREAST BIOPSY WITH NEEDLE LOCALIZATION;  Surgeon: Rolm Bookbinder, MD;  Location: [facility];  Service: General;  Laterality: Left;  left breast wire localization biopsy  . cataracts right eye    . CESAREAN SECTION  1986  . HERNIA REPAIR  2000   RIH  . opirectinal membrane peel    . RHINOPLASTY  1983    There were no vitals filed for this visit.      Subjective Assessment - 12/17/15 1642    Subjective  Hello   Pertinent History see epic snapshot - ABI/hypoxia from tic bite/ virus   Patient Stated Goals Pt unable to state.    Currently in Pain? No/denies    Pain Score 0-No pain                      OT Treatments/Exercises (OP) - 12/17/15 0001      Neurological Re-education Exercises   Other Exercises 1 Neuro reeducation to address transitional movements specifically sit to stand and stand to sit with emphasis on activation of extensors in left leg, and weight bearing through left leg.  Patient with optimal results with slow, direct cueing - with focus on aligned trunk over base of support.               Balance Exercises - 12/16/15 1026      Balance Exercises: Seated   Static Sitting Eyes opened   Dynamic Sitting Eyes opened;Anterior/posterior weight shift;Lateral weight shift             OT Short Term Goals - 12/16/15 1300      OT SHORT TERM GOAL #1   Title Pt and husband will be mod I with HEP - 12/18/2015   Status On-going     OT SHORT TERM GOAL #2   Title Pt will demonstrate adequate postural control for breath support to voice at least 2 words   Status Achieved     OT SHORT  TERM GOAL #3   Title Pt will transfer to commode with moderate assistance 25% of the time.   Status On-going     OT SHORT TERM GOAL #4   Title Pt will tolerate positional changes as evidenced by respiration rate and motor quieting in preparation for funcitonal mobility   Status Achieved           OT Long Term Goals - 12/16/15 1301      OT LONG TERM GOAL #1   Title Pt and husband will be mod I with upgraded HEP - 01/15/2016   Status On-going     OT LONG TERM GOAL #2   Title Pt will demonstrate adequate postural control to support 3 words in supported sitting.   Status On-going     OT LONG TERM GOAL #3   Title Pt will demonstrate ability for low reach with RUE for functional task with  mod facilitation   Status Achieved     OT LONG TERM GOAL #4   Title Pt will wash face with LUE with min a in supported sitting   Status Achieved     OT LONG TERM GOAL #5   Title Pt will release objects with L hand with min  faciltation    Status Achieved     OT LONG TERM GOAL #6   Title Pt will stand with min a while caregiver manages pants during LB dressing 75% of the time.   Status On-going     OT LONG TERM GOAL #7   Title Pt will transfer to commode with mod a 50% of the time.   Status On-going     OT LONG TERM GOAL #8   Title Pt will be able to sit unsupported with supervision 50% of the time while engaged in functional task.   Status On-going               Plan - 12/17/15 1646    Clinical Impression Statement Patient with improved alignment over feet in standing - more erect psoture   Rehab Potential Fair   Clinical Impairments Affecting Rehab Potential severity of deficits, slow rate of recovery   OT Frequency 3x / week   OT Duration Other (comment)   OT Treatment/Interventions Self-care/ADL training;Ultrasound;Moist Heat;Electrical Stimulation;DME and/or AE instruction;Neuromuscular education;Therapeutic exercise;Functional Mobility Training;Manual Therapy;Passive range of motion;Splinting;Therapeutic activities;Balance training;Patient/family education;Visual/perceptual remediation/compensation;Cognitive remediation/compensation   Plan sit to stand with emphasis on lift off, stand balance - static, decrease use of UE's for mobility   Consulted and Agree with Plan of Care Patient;Family member/caregiver   Family Member Consulted caregiver [name],      Patient will benefit from skilled therapeutic intervention in order to improve the following deficits and impairments:  Decreased endurance, Decreased skin integrity, Impaired vision/preception, Improper body mechanics, Impaired perceived functional ability, Decreased knowledge of precautions, Cardiopulmonary status limiting activity, Decreased activity tolerance, Decreased knowledge of use of DME, Decreased strength, Impaired flexibility, Improper spinal/pelvic alignment, Impaired sensation, Difficulty walking, Decreased mobility, Decreased  balance, Decreased cognition, Decreased range of motion, Impaired tone, Pain, Impaired UE functional use, Decreased safety awareness, Decreased coordination  Visit Diagnosis: Muscle weakness (generalized)  Abnormal posture  Unsteadiness on feet  Other lack of coordination  Stiffness of left shoulder, not elsewhere classified  Stiffness of right shoulder, not elsewhere classified  Stiffness of left elbow, not elsewhere classified    Problem List Patient Active Problem List   Diagnosis Date Noted  . Tracheostomy tube present ([place])   . Tracheal stenosis   .  Chronic respiratory failure with hypoxia ([facility])   . 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[place]) 10/20/2015  . Tracheostomy status ([facility]) 09/26/2015  . Allergic rhinitis 09/26/2015  . 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 Movement disorder 09/22/2015  . Encephalitis 09/22/2015  . Chest pain 02/07/2014  . Premature atrial contractions 01/13/2014  . Abnormality of gait 12/04/2013  . Hyperlipidemia 12/21/2011  . Cardiovascular risk factor 12/21/2011  . Bunion 01/31/2008  . Metatarsalgia of both feet 09/20/2007  . FLAT FOOT 09/20/2007  . HALLUX RIGIDUS, ACQUIRED 09/20/2007    Mariah Milling, OTR/L 12/17/2015, 4:48 PM  [facility] [address] [place], [place], [address] Phone: [phone]   Fax:  [phone]  Name: Judy Spears MRN: [id number] Date of Birth: September 17, 1950

## 2015-12-17 NOTE — Therapy (Signed)
[facility] [address] [place], [place], [address] Phone: [phone]   Fax:  [phone]  Physical Therapy Treatment  Patient Details  Name: Judy Spears MRN: [id number] Date of Birth: [birth year] Referring Provider: Alger Simons, MD  Encounter Date: 12/17/2015      PT End of Session - 12/17/15 1657    Visit Number 37   Number of Visits 45   Date for PT Re-Evaluation 01/18/16   Authorization Type BCBS   PT Start Time 1445   PT Stop Time 1530   PT Time Calculation (min) 45 min   Activity Tolerance Patient tolerated treatment well   Behavior During Therapy [facility] for tasks assessed/performed      Past Medical History:  Diagnosis Date  . Arthritis    lt great toe  . Complication of anesthesia    pt has had headaches post op-did advise to hydra well preop  . Hair loss    right-sided  . History of exercise stress test    a. ETT (12/15) with 12:00 exercise, no ST changes, occasional PACs.   . Hyperlipidemia   . Insomnia   . Migraine headache   . 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 Postmenopausal   . Premature atrial contractions    a. Holter (12/15) with 8% PACs, no atrial runs or atrial fibrillation.     Past Surgical History:  Procedure Laterality Date  . BREAST BIOPSY  01/01/2011   Procedure: BREAST BIOPSY WITH NEEDLE LOCALIZATION;  Surgeon: Rolm Bookbinder, MD;  Location: [facility];  Service: General;  Laterality: Left;  left breast wire localization biopsy  . cataracts right eye    . CESAREAN SECTION  1986  . HERNIA REPAIR  2000   RIH  . opirectinal membrane peel    . RHINOPLASTY  1983    There were no vitals filed for this visit.      Subjective Assessment - 12/17/15 1656    Subjective Pt alert, awake, and smiling. Caregivers report pt continues to use Stedy at home.   Pertinent History Precautions:  PEG.  PMH significant for: viral encephalitis due to [place]  Virus (01/02/16), acute respiratory failure, R vocal cord paralysis, tracheal stenosis s/p repair on 07/08/15, s/p Botox injections in B ankle plantarflexors and L SCM/scalenes    Patient Stated Goals Per husband, "For [name] to be as independent as possible."   Currently in Pain? No/denies            NMR: While wearing B articulating AFO's (modified to stabilize articulation to increase B knee extension), pt performed the following activities in standing with BUE support at // bars: LLE advancement with +2A to maximize effectiveness of cueing; therapist anterior to pt providing multimodal cueing for midline posture, thoracic spine extension, and to correct pt tendenct toward trunk L lateral flexion/L rotation; PT posterior to pt providing max facilitation of L hip/knee extension and manually stabilizing L foot on floor to prevent L hip ER/ADD due to tone. Progressed to standing, performing R foot tap onto 8" step with max A for RLE placement onto step (modified to 6" step for subsequent trial); performed static standing x30 seconds with R foot on 8" step for increased LE dissociation and L iliopsoas stretch; required +2A for stability/balance and alignment. Progressed to stepping onto/off of step with +2A.    During final 15 minutes of session, performed blocked practice of partial standing (supported by [facility] seat) > standing  using Stedy with NMES at L quadriceps (VMO) to facilitate final 30* of L knee extension during standing. Pt able to achieve standing without hands-on assistance. Therapist utilized sheet at B hips to facilitate increased B hip extension in standing at [facility]; progressed to standing without use of sheet. NMES appeared grossly effective in increasing active L knee extension (upon visual assessment); however, difficult to assess without formal measurement.                 PT Short Term Goals - 12/16/15 1001      PT SHORT TERM GOAL #3   Title Pt will demonstrate midline  posture in static sitting with BLE support with 50% verbal cueing to indicate increased postural awareness/control.  (12/17/15)   Status Achieved           PT Long Term Goals - 11/19/15 2034      PT LONG TERM GOAL #1   Title Pt will consistently perform supine <> sit with supervision, 25% cueing to indicate increased independence getting out of bed.   (Target: 01/14/16)   Time 8   Period Weeks   Status New     PT LONG TERM GOAL #2   Title Pt will demonstrate midline posture in static sitting with BLE support with subtle to min cueing to indicate increased postural awareness/control. (01/14/16)   Time 8   Period Weeks   Status New     PT LONG TERM GOAL #3   Title Pt will consistently perform level transfers from w/c <> mat table with min A, 25% cueing to decrease caregiver burden.  (01/14/16)   Time 8   Period Weeks   Status New     PT LONG TERM GOAL #4   Title Pt will consistently perform sit <> stand from personal w/c with BUE support and min A, 25% cueing to decrease caregiver burden.  (01/14/16)   Time 8   Period Weeks   Status New     PT LONG TERM GOAL #5   Title PT will assess gait to determine if ambulation is safe/functional.  (01/14/16)   Time 8   Period Weeks   Status New               Plan - 12/17/15 1657    Clinical Impression Statement Skilled session focused on increasing L single limb stance stability to progress toward gait training. Also utilized NMES at L quadriceps for full knee extension during standing with use of Stedy. Pt tolerated interventions well; awake and alert throughout this session.    Clinical Impairments Affecting Rehab Potential Positive: strong family/social support; negative: chronicity of condition   PT Frequency 3x / week  followed by 2x/week for 4 weeks   PT Duration 4 weeks  followed by 2x/week for 4 weeks   PT Treatment/Interventions ADLs/Self Care Home Management;DME Instruction;Gait training;Stair training;Functional  mobility training;Therapeutic activities;Patient/family education;Cognitive remediation;Neuromuscular re-education;Therapeutic exercise;Balance training;Orthotic Fit/Training;Wheelchair mobility training;Manual techniques;Splinting;Visual/perceptual remediation/compensation;Vestibular;Passive range of motion   PT Next Visit Plan Continue pre-gait if skilled +2A available . If no +2A, may be able to start pre-gait at // bars. Begin checking STG's, as able.    Consulted and Agree with Plan of Care Patient;Family member/caregiver   Family Member Consulted caregiver, [name] and husband, [name]      Patient will benefit from skilled therapeutic intervention in order to improve the following deficits and impairments:  Abnormal gait, Decreased balance, Decreased endurance, Decreased cognition, Cardiopulmonary status limiting activity, Decreased activity tolerance, Decreased coordination,  Decreased strength, Impaired flexibility, Impaired tone, Decreased mobility, Decreased skin integrity, Increased muscle spasms, Impaired vision/preception, Decreased range of motion, Impaired UE functional use, Postural dysfunction, Pain  Visit Diagnosis: Muscle weakness (generalized)  Abnormal posture  Other abnormalities of gait and mobility  Stiffness of left hip, not elsewhere classified  Other lack of coordination  Unsteadiness on feet     Problem List Patient Active Problem List   Diagnosis Date Noted  . Tracheostomy tube present ([place])   . Tracheal stenosis   . Chronic respiratory failure with hypoxia ([place])   . 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[place]) 10/20/2015  . Tracheostomy status ([place]) 09/26/2015  . Allergic rhinitis 09/26/2015  . 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 Movement disorder 09/22/2015  . Encephalitis 09/22/2015  . Chest pain 02/07/2014  . Premature atrial contractions 01/13/2014  . Abnormality of gait 12/04/2013  . Hyperlipidemia 12/21/2011  . Cardiovascular risk factor 12/21/2011  . Bunion  01/31/2008  . Metatarsalgia of both feet 09/20/2007  . FLAT FOOT 09/20/2007  . HALLUX RIGIDUS, ACQUIRED 09/20/2007    .Billie Ruddy, PT, DPT [facility] [address] [place], [place], [address] Phone: [phone]   Fax:  [phone] 12/17/15, 5:13 PM  Name: NACHOLE HEBRON MRN: [id number] Date of Birth: 11/03/1950

## 2015-12-22 ENCOUNTER — Encounter: Payer: Self-pay | Admitting: Occupational Therapy

## 2015-12-22 ENCOUNTER — Ambulatory Visit: Payer: BC Managed Care – PPO | Admitting: Physical Therapy

## 2015-12-22 ENCOUNTER — Ambulatory Visit: Payer: BC Managed Care – PPO | Admitting: *Deleted

## 2015-12-22 ENCOUNTER — Ambulatory Visit: Payer: BC Managed Care – PPO | Admitting: Occupational Therapy

## 2015-12-22 DIAGNOSIS — R2681 Unsteadiness on feet: Secondary | ICD-10-CM

## 2015-12-22 DIAGNOSIS — R471 Dysarthria and anarthria: Secondary | ICD-10-CM

## 2015-12-22 DIAGNOSIS — R293 Abnormal posture: Secondary | ICD-10-CM

## 2015-12-22 DIAGNOSIS — R2689 Other abnormalities of gait and mobility: Secondary | ICD-10-CM

## 2015-12-22 DIAGNOSIS — M25611 Stiffness of right shoulder, not elsewhere classified: Secondary | ICD-10-CM

## 2015-12-22 DIAGNOSIS — R278 Other lack of coordination: Secondary | ICD-10-CM

## 2015-12-22 DIAGNOSIS — M6281 Muscle weakness (generalized): Secondary | ICD-10-CM

## 2015-12-22 DIAGNOSIS — M25622 Stiffness of left elbow, not elsewhere classified: Secondary | ICD-10-CM

## 2015-12-22 DIAGNOSIS — R1312 Dysphagia, oropharyngeal phase: Secondary | ICD-10-CM

## 2015-12-22 DIAGNOSIS — M25612 Stiffness of left shoulder, not elsewhere classified: Secondary | ICD-10-CM

## 2015-12-22 DIAGNOSIS — R41844 Frontal lobe and executive function deficit: Secondary | ICD-10-CM

## 2015-12-22 NOTE — Therapy (Signed)
[facility] [address] [place], [place], [address] Phone: [phone]   Fax:  [phone]  Physical Therapy Treatment  Patient Details  Name: Judy Spears MRN: [id number] Date of Birth: 1950-07-27 Referring Provider: Alger Simons, MD  Encounter Date: 12/22/2015      PT End of Session - 12/22/15 1642    Visit Number 38   Number of Visits 45   Date for PT Re-Evaluation 01/18/16   Authorization Type BCBS   PT Start Time 1446   PT Stop Time 1531   PT Time Calculation (min) 45 min      Past Medical History:  Diagnosis Date  . Arthritis    lt great toe  . Complication of anesthesia    pt has had headaches post op-did advise to hydra well preop  . Hair loss    right-sided  . History of exercise stress test    a. ETT (12/15) with 12:00 exercise, no ST changes, occasional PACs.   . Hyperlipidemia   . Insomnia   . Migraine headache   . 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 Postmenopausal   . Premature atrial contractions    a. Holter (12/15) with 8% PACs, no atrial runs or atrial fibrillation.     Past Surgical History:  Procedure Laterality Date  . BREAST BIOPSY  01/01/2011   Procedure: BREAST BIOPSY WITH NEEDLE LOCALIZATION;  Surgeon: Rolm Bookbinder, MD;  Location: [facility];  Service: General;  Laterality: Left;  left breast wire localization biopsy  . cataracts right eye    . CESAREAN SECTION  1986  . HERNIA REPAIR  2000   RIH  . opirectinal membrane peel    . RHINOPLASTY  1983    There were no vitals filed for this visit.      Subjective Assessment - 12/22/15 1636    Subjective Pt smiling - caregiver reports they had been shopping prior to therapy appointments   Patient is accompained by: Family member  caregiver [name]   Pertinent History Precautions:  PEG.  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 No/denies                         [facility] Adult PT Treatment/Exercise - 12/22/15 1432      Bed Mobility   Bed Mobility Rolling Right;Rolling Left   Rolling Right 3: Mod assist   Rolling Right Details (indicate cue type and reason) cues given at pelvis to initiate pelvic/trunk dissociation   Rolling Left 3: Mod assist   Supine to Sit 2: Max assist   Supine to Sit Details (indicate cue type and reason) max assist +2     Transfers   Transfers Sit to Stand   Sit to Stand 3: Mod assist   Sit to Stand Details Tactile cues for placement;Tactile cues for posture;Manual facilitation for weight shifting   Sit to Stand Details (indicate cue type and reason) Performed sit to stand from hi/lo mat table to [place]   Sit to Stand: Patient Percentage 40%   Stand to Sit 3: Mod assist  for controlled descent     Static Standing Balance  Static Standing - Balance Support Bilateral upper extremity supported   Static Standing - Level of Assistance 4: Min assist;5: Stand by assistance     TherEx: LE hip/knee flexion/ext. X 15 reps AAROM: R and L hip abdct/adduction in hooklying position x 15 reps each with assistance  Attempted tall kneeling position from L sidelying position - with use of [name] bench - pt unable to tolerate - total assist required to assume tall  kneeling position  Supine to sit with max assist +2 -   pt became upset after tall kneeling activity  Worked on sitting balance with active trunk rotation with ball - handing to tech on R and L sides; pt performed reaching activity with R hand to PT's LLE With PT sitting on L side; then touching tech's RLE with her L hand for fascilitation of trunk rotation             PT Short Term Goals -  12/16/15 1001      PT SHORT TERM GOAL #3   Title Pt will demonstrate midline posture in static sitting with BLE support with 50% verbal cueing to indicate increased postural awareness/control.  (12/17/15)   Status Achieved           PT Long Term Goals - 11/19/15 2034      PT LONG TERM GOAL #1   Title Pt will consistently perform supine <> sit with supervision, 25% cueing to indicate increased independence getting out of bed.   (Target: 01/14/16)   Time 8   Period Weeks   Status New     PT LONG TERM GOAL #2   Title Pt will demonstrate midline posture in static sitting with BLE support with subtle to min cueing to indicate increased postural awareness/control. (01/14/16)   Time 8   Period Weeks   Status New     PT LONG TERM GOAL #3   Title Pt will consistently perform level transfers from w/c <> mat table with min A, 25% cueing to decrease caregiver burden.  (01/14/16)   Time 8   Period Weeks   Status New     PT LONG TERM GOAL #4   Title Pt will consistently perform sit <> stand from personal w/c with BUE support and min A, 25% cueing to decrease caregiver burden.  (01/14/16)   Time 8   Period Weeks   Status New     PT LONG TERM GOAL #5   Title PT will assess gait to determine if ambulation is safe/functional.  (01/14/16)   Time 8   Period Weeks   Status New               Plan - 12/22/15 1650    Clinical Impression Statement Pt has difficulty extending LLE in supine position and difficulty flexing RLE with ataxic movements noted:  pt unable to assume tall kneeling position with use of [employer] - became upset and tearful; pt needs mod assist with trunk rotation actively   Rehab Potential Fair   Clinical Impairments Affecting Rehab Potential Positive: strong family/social support; negative: chronicity of condition   PT Frequency 3x / week   PT Duration 4 weeks   PT Treatment/Interventions ADLs/Self Care Home Management;DME Instruction;Gait training;Stair  training;Functional mobility training;Therapeutic activities;Patient/family education;Cognitive remediation;Neuromuscular re-education;Therapeutic exercise;Balance training;Orthotic Fit/Training;Wheelchair mobility training;Manual techniques;Splinting;Visual/perceptual remediation/compensation;Vestibular;Passive range of motion   PT Next Visit Plan cont pre- gait activities, bed mobility and LE ROM   Consulted and Agree with Plan of Care Patient;Family member/caregiver  Family Member Consulted caregiver, [name] and husband, [name]      Patient will benefit from skilled therapeutic intervention in order to improve the following deficits and impairments:  Abnormal gait, Decreased balance, Decreased endurance, Decreased cognition, Cardiopulmonary status limiting activity, Decreased activity tolerance, Decreased coordination, Decreased strength, Impaired flexibility, Impaired tone, Decreased mobility, Decreased skin integrity, Increased muscle spasms, Impaired vision/preception, Decreased range of motion, Impaired UE functional use, Postural dysfunction, Pain  Visit Diagnosis: Muscle weakness (generalized)  Other abnormalities of gait and mobility     Problem List Patient Active Problem List   Diagnosis Date Noted  . Tracheostomy tube present ([facility])   . Tracheal stenosis   . Chronic respiratory failure with hypoxia ([facility])   . 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[facility]) 10/20/2015  . Tracheostomy status ([facility]) 09/26/2015  . Allergic rhinitis 09/26/2015  . 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 Movement disorder 09/22/2015  . Encephalitis 09/22/2015  . Chest pain 02/07/2014  . Premature atrial contractions 01/13/2014  . Abnormality of gait 12/04/2013  . Hyperlipidemia 12/21/2011  . Cardiovascular risk factor 12/21/2011  . Bunion 01/31/2008  . Metatarsalgia of both feet 09/20/2007  . FLAT FOOT 09/20/2007  . HALLUX RIGIDUS, ACQUIRED 09/20/2007    Alda Lea, PT 12/22/2015, 4:58 PM  [facility] [address] [place], [place], [address] Phone: [phone]   Fax:  [phone]  Name: JOIYA POTENZA MRN: [id number] Date of Birth: 1950/11/02

## 2015-12-22 NOTE — Therapy (Signed)
[facility] [address] [place], [place], [address] Phone: [phone]   Fax:  [phone]  Speech Language Pathology Treatment  Patient Details  Name: Judy Spears MRN: [id number] Date of Birth: [birth year] Referring Provider: Dr. Alger Simons  Encounter Date: 12/22/2015      End of Session - 12/22/15 1624    Visit Number 32   Number of Visits 36   Date for SLP Re-Evaluation 01/02/16   SLP Start Time 1535   SLP Stop Time  1615   SLP Time Calculation (min) 40 min   Activity Tolerance Patient tolerated treatment well;Patient limited by fatigue      Past Medical History:  Diagnosis Date  . Arthritis    lt great toe  . Complication of anesthesia    pt has had headaches post op-did advise to hydra well preop  . Hair loss    right-sided  . History of exercise stress test    a. ETT (12/15) with 12:00 exercise, no ST changes, occasional PACs.   . Hyperlipidemia   . Insomnia   . Migraine headache   . 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 Postmenopausal   . Premature atrial contractions    a. Holter (12/15) with 8% PACs, no atrial runs or atrial fibrillation.     Past Surgical History:  Procedure Laterality Date  . BREAST BIOPSY  01/01/2011   Procedure: BREAST BIOPSY WITH NEEDLE LOCALIZATION;  Surgeon: Rolm Bookbinder, MD;  Location: [facility];  Service: General;  Laterality: Left;  left breast wire localization biopsy  . cataracts right eye    . CESAREAN SECTION  1986  . HERNIA REPAIR  2000   RIH  . opirectinal membrane peel    . RHINOPLASTY  1983    There were no vitals filed for this visit.      Subjective Assessment - 12/22/15 1618    Subjective Pt bright-eyed.    Patient is accompained by: Family member  [name] and [name]               ADULT SLP TREATMENT - 12/22/15 1619      General Information   Behavior/Cognition Cooperative;Pleasant mood;Alert     Treatment Provided   Treatment provided Dysphagia;Cognitive-Linquistic     Dysphagia Treatment   Respiratory Status Room air   Oral Cavity - Dentition Adequate natural dentition   Treatment Methods Skilled observation;Upgraded PO texture trial;Patient/caregiver education   Patient observed directly with PO's Yes   Type of PO's observed Ice chips   Feeding Needs assist   Liquids provided via Teaspoon   Oral Phase Signs & Symptoms Anterior loss/spillage;Prolonged bolus formation;Oral holding   Pharyngeal Phase Signs & Symptoms Suspected delayed swallow initiation;Wet vocal quality   Type of cueing Verbal;Tactile;Visual   Amount of cueing Moderate     Pain Assessment   Pain Assessment Faces   Faces Pain Scale No hurt     Cognitive-Linquistic Treatment   Treatment focused on Voice;Dysarthria   Skilled Treatment Pt initially without ability to vocalize- attempts were characterized by congestion. Congestion improved with throat clear accompanied by abdominal support and PO trials of ice chips. Adequate vocalizations in isolated vowel prolongations only. No true word or phrase level verbalizations despite abdominal support and stoma covering and multiple attempts by the pt.     Assessment / Recommendations / Plan   Plan Continue with current plan of care     Progression Toward Goals  Progression toward goals Progressing toward goals            SLP Short Term Goals - 12/22/15 1627      SLP SHORT TERM GOAL #1   Title Pt will demonstrate audible phonation with words, vowels, or syllables in 4/10 trials with max A over three sessions   Baseline 10/30,   Status Revised     SLP SHORT TERM GOAL #2   Title Pt will demonstrate breath support with max A, prior to repetition of words 4/10   Status Achieved     SLP SHORT TERM GOAL #3   Title Pt will swallow ice chip/H20 via spoon in less than 7 seconds average with usual min to mod A 3/5 trials   Baseline 9/25, 10/30, 10/31   Status  On-going     SLP SHORT TERM GOAL #4   Title Pt will sustain attention to therapy task for over 7 minutes with occasional min A   Status Achieved          SLP Long Term Goals - 12/22/15 1627      SLP LONG TERM GOAL #1   Title Pt will imitate one to two syllables with voicing in upright position 50% of the time.   Time 1   Period Weeks   Status On-going     SLP LONG TERM GOAL #2   Title pt will demonstrate adequate breath support for speech 25% of the time with imitative responses and max cues usually (50-80%)   Time 1   Period Weeks   Status On-going     SLP LONG TERM GOAL #3   Title after oral care, pt will achieve initiation of oral phase of the swallow with ice chip/water trials with average <6 seconds over 4 sessions   Baseline 12-12-15   Time 1   Period Weeks   Status On-going          Plan - 12/22/15 1625    Clinical Impression Statement Initially improved automatic swallow, however timeliness diminished as the session increased and anterior spillage increased. Ongoing SLP intervention indicated for communication and swallowing.    Speech Therapy Frequency 3x / week   Duration 4 weeks   Treatment/Interventions Aspiration precaution training;Trials of upgraded texture/liquids;Compensatory strategies;Functional tasks;Patient/family education;Cueing hierarchy;Diet toleration management by SLP;Cognitive reorganization;Compensatory techniques;SLP instruction and feedback;Internal/external aids;Multimodal communcation approach   Potential to Achieve Goals Fair   Potential Considerations Ability to learn/carryover information;Co-morbidities;Severity of impairments   Consulted and Agree with Plan of Care Family member/caregiver      Patient will benefit from skilled therapeutic intervention in order to improve the following deficits and impairments:   Dysarthria and anarthria  Dysphagia, oropharyngeal phase    Problem List Patient Active Problem List   Diagnosis  Date Noted  . Tracheostomy tube present ([facility])   . Tracheal stenosis   . Chronic respiratory failure with hypoxia ([facility])   . 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[facility]) 10/20/2015  . Tracheostomy status ([facility]) 09/26/2015  . Allergic rhinitis 09/26/2015  . 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 Movement disorder 09/22/2015  . Encephalitis 09/22/2015  . Chest pain 02/07/2014  . Premature atrial contractions 01/13/2014  . Abnormality of gait 12/04/2013  . Hyperlipidemia 12/21/2011  . Cardiovascular risk factor 12/21/2011  . Bunion 01/31/2008  . Metatarsalgia of both feet 09/20/2007  . FLAT FOOT 09/20/2007  . HALLUX RIGIDUS, ACQUIRED 09/20/2007    [place], CCC-SLP 12/22/2015, 4:29 PM  [facility] [address]  [place], [place], [address] Phone: [phone]   Fax:  [phone]   Name: Judy Spears MRN: [id number] Date of Birth: Oct 19, 1950

## 2015-12-22 NOTE — Therapy (Signed)
[facility] [address] [place], [place], [address] Phone: [phone]   Fax:  [phone]  Occupational Therapy Treatment  Patient Details  Name: Judy Spears MRN: [id number] Date of Birth: 1950/12/21 Referring Provider: Dr. Sharion Dove  Encounter Date: 12/22/2015      OT End of Session - 12/22/15 1626    Visit Number 37   Number of Visits 67   Date for OT Re-Evaluation 01/25/16   Authorization Type BCBS unlimited visits   OT Start Time [id number]   OT Stop Time 1446   OT Time Calculation (min) 43 min   Activity Tolerance Patient tolerated treatment well      Past Medical History:  Diagnosis Date  . Arthritis    lt great toe  . Complication of anesthesia    pt has had headaches post op-did advise to hydra well preop  . Hair loss    right-sided  . History of exercise stress test    a. ETT (12/15) with 12:00 exercise, no ST changes, occasional PACs.   . Hyperlipidemia   . Insomnia   . Migraine headache   . 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 Postmenopausal   . Premature atrial contractions    a. Holter (12/15) with 8% PACs, no atrial runs or atrial fibrillation.     Past Surgical History:  Procedure Laterality Date  . BREAST BIOPSY  01/01/2011   Procedure: BREAST BIOPSY WITH NEEDLE LOCALIZATION;  Surgeon: Rolm Bookbinder, MD;  Location: [facility];  Service: General;  Laterality: Left;  left breast wire localization biopsy  . cataracts right eye    . CESAREAN SECTION  1986  . HERNIA REPAIR  2000   RIH  . opirectinal membrane peel    . RHINOPLASTY  1983    There were no vitals filed for this visit.      Subjective Assessment - 12/22/15 1405    Subjective  That's so nice (when talking about recent holiday)   Pertinent History see epic snapshot - ABI/hypoxia from tic bite/ virus   Patient Stated Goals Pt unable to state.    Currently in Pain? No/denies                       OT Treatments/Exercises (OP) - 12/22/15 0001      Neurological Re-education Exercises   Other Exercises 1 Neuro re ed to address sitting balance, finding midline, trunk control in sitting and standing, sit to stand, standing balance and stepping with platform walker.  Pt currently needs max a x2 for stepping with platform and is inconsistent with abilit to engage for sit to stand - at times able to intiate and assist to only needing min a x2 to come into half standing and at times needing total assist.  Pt seemed more agitated today and more fearful of movement today.  Max a stand pivot transfer.                  OT Short Term Goals - 12/22/15 1622      OT SHORT TERM GOAL #1   Title Pt and husband will be mod I with HEP - 12/18/2015   Status On-going     OT SHORT TERM GOAL #2   Title Pt will demonstrate adequate postural control for breath support to voice at least 2 words   Status Achieved     OT SHORT TERM GOAL #3  Title Pt will transfer to commode with moderate assistance 25% of the time.   Status On-going     OT SHORT TERM GOAL #4   Title Pt will tolerate positional changes as evidenced by respiration rate and motor quieting in preparation for funcitonal mobility   Status Achieved           OT Long Term Goals - 12/22/15 1623      OT LONG TERM GOAL #1   Title Pt and husband will be mod I with upgraded HEP - 01/15/2016   Status On-going     OT LONG TERM GOAL #2   Title Pt will demonstrate adequate postural control to support 3 words in supported sitting.   Status On-going     OT LONG TERM GOAL #3   Title Pt will demonstrate ability for low reach with RUE for functional task with  mod facilitation   Status Achieved     OT LONG TERM GOAL #4   Title Pt will wash face with LUE with min a in supported sitting   Status Achieved     OT LONG TERM GOAL #5   Title Pt will release objects with L hand with min faciltation     Status Achieved     OT LONG TERM GOAL #6   Title Pt will stand with min a while caregiver manages pants during LB dressing 75% of the time.   Status On-going     OT LONG TERM GOAL #7   Title Pt will transfer to commode with mod a 50% of the time.   Status On-going     OT LONG TERM GOAL #8   Title Pt will be able to sit unsupported with supervision 50% of the time while engaged in functional task.   Status On-going               Plan - 12/22/15 1623    Clinical Impression Statement Pt at times with improved ability to engage in functional mobility and to assist with movement - pt is inconsistent in ability.  Caregiver can now manage toilet transfers with steady assist.    Rehab Potential Fair   Clinical Impairments Affecting Rehab Potential severity of deficits, slow rate of recovery   OT Frequency 3x / week   OT Duration Other (comment)   OT Treatment/Interventions Self-care/ADL training;Ultrasound;Moist Heat;Electrical Stimulation;DME and/or AE instruction;Neuromuscular education;Therapeutic exercise;Functional Mobility Training;Manual Therapy;Passive range of motion;Splinting;Therapeutic activities;Balance training;Patient/family education;Visual/perceptual remediation/compensation;Cognitive remediation/compensation   Plan sit to stand with emphasis on lift off, stand balance, statis, decrease use of UE's for mobility, increase active use of LLE in standing.   Consulted and Agree with Plan of Care Patient;Family member/caregiver   Family Member Consulted caregiver [name], husband [name] at end of session      Patient will benefit from skilled therapeutic intervention in order to improve the following deficits and impairments:  Decreased endurance, Decreased skin integrity, Impaired vision/preception, Improper body mechanics, Impaired perceived functional ability, Decreased knowledge of precautions, Cardiopulmonary status limiting activity, Decreased activity tolerance, Decreased  knowledge of use of DME, Decreased strength, Impaired flexibility, Improper spinal/pelvic alignment, Impaired sensation, Difficulty walking, Decreased mobility, Decreased balance, Decreased cognition, Decreased range of motion, Impaired tone, Pain, Impaired UE functional use, Decreased safety awareness, Decreased coordination  Visit Diagnosis: Muscle weakness (generalized)  Abnormal posture  Other lack of coordination  Unsteadiness on feet  Stiffness of left shoulder, not elsewhere classified  Stiffness of right shoulder, not elsewhere classified  Stiffness of left elbow, not  elsewhere classified  Frontal lobe and executive function deficit    Problem List Patient Active Problem List   Diagnosis Date Noted  . Tracheostomy tube present ([facility])   . Tracheal stenosis   . Chronic respiratory failure with hypoxia ([facility])   . 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[facility]) 10/20/2015  . Tracheostomy status ([facility]) 09/26/2015  . Allergic rhinitis 09/26/2015  . 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 Movement disorder 09/22/2015  . Encephalitis 09/22/2015  . Chest pain 02/07/2014  . Premature atrial contractions 01/13/2014  . Abnormality of gait 12/04/2013  . Hyperlipidemia 12/21/2011  . Cardiovascular risk factor 12/21/2011  . Bunion 01/31/2008  . Metatarsalgia of both feet 09/20/2007  . FLAT FOOT 09/20/2007  . HALLUX RIGIDUS, ACQUIRED 09/20/2007    Quay Burow, OTR/L 12/22/2015, 4:27 PM  [facility] [address] [place], [place], [address] Phone: [phone]   Fax:  [phone]  Name: Judy Spears MRN: [id number] Date of Birth: 1951/01/12

## 2015-12-23 ENCOUNTER — Telehealth: Payer: Self-pay | Admitting: Physical Medicine & Rehabilitation

## 2015-12-23 NOTE — Telephone Encounter (Signed)
Spoke with her husband. Advised him to keep an eye on her oxygenation, respiratory rate and work of breathing as well as temp. If any further decline in status, he was advised to take her to urgent care/ED. She sees pulmonology tomorrow morning.

## 2015-12-23 NOTE — Telephone Encounter (Signed)
Patient's husband has a cold and thinks he may have given it to patient.  She has a lot of secretions in mouth and has suctioned her out.  She has also been running a fever of 100 and didn't know what else to do for her.  Please call him at [phone].

## 2015-12-24 ENCOUNTER — Ambulatory Visit (INDEPENDENT_AMBULATORY_CARE_PROVIDER_SITE_OTHER): Payer: BC Managed Care – PPO | Admitting: Pulmonary Disease

## 2015-12-24 ENCOUNTER — Encounter: Payer: Self-pay | Admitting: Pulmonary Disease

## 2015-12-24 DIAGNOSIS — J209 Acute bronchitis, unspecified: Secondary | ICD-10-CM

## 2015-12-24 DIAGNOSIS — Z930 Tracheostomy status: Secondary | ICD-10-CM | POA: Diagnosis not present

## 2015-12-24 MED ORDER — AMOXICILLIN 400 MG/5ML PO SUSR: 400.0000 mg | Freq: Three times a day (TID) | ORAL | 0 refills | Status: DC

## 2015-12-24 NOTE — Assessment & Plan Note (Signed)
Judy Spears has an acute viral tracheobronchitis consistent with with been going on in our community and her family lately. Fortunately, today seems to be a better day. She does have some increase in secretions because of this right now. Her lungs are clear to auscultation and her vital signs are normal today so there is no evidence of pneumonia.  Plan: Use guaifenesin over the next several days to 10 the secretions Increase fluid intake I gave a prescription for amoxicillin with instructions to use only if her secretions increase, her respiratory status changes she has recurrent fever, or her mental status changes. At that point we would also need to get a chest x-ray

## 2015-12-24 NOTE — Assessment & Plan Note (Addendum)
Generally Judy Spears has done well since having her tracheostomy removed. She's been able to clear most of her secretions without too much difficulty. Her tracheostomy stoma is still patent with a pinhole visible today.  Plan: Continue current management of the stoma with a Band-Aid over the stoma Weekend visit the issue of a surgical correction of the stoma in April if it still patent 6 months after trach removal, but at this point I don't think there is a role for that

## 2015-12-24 NOTE — Patient Instructions (Signed)
I recommend that you take guaifenesin liquid up to 2400 mg a day for the next several days to help thin the secretions Increase free water intake by about 500 mL for the next 3 days If her respiratory status worsens (increasing shortness of breath) or if she has increasing secretions or a high fever or change in her overall condition (more fatigue, malaise) then go ahead and take the amoxicillin prescription I gave you and call us if we can order a chest x-ray Otherwise, we'll plan on seeing you back in late January to early February 2018

## 2015-12-24 NOTE — Progress Notes (Signed)
Subjective:    Patient ID: Judy Spears, female    DOB: 03-24-1950, 65 y.o.   MRN: [id number]  Synopsis: In November she 2016 was bitten by a tick and then within 2 weeks had a progressive neurologic illness. Specifically she was found unresponsive by her husband while working in [place] of [place]. She was brought to local hospital where she required intubation due to unresponsiveness. Tracheostomy was performed on hospital day 2 and extensive workup for her neurologic illness begun. 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 She was then transferred for inpatient rehabilitation to the [facility] in [place]. While there she continued to participate in physical therapy and occupational therapy. However, during this time she was never able to consistently take by mouth and all nutrition has been provided the enteral feeding since around the time of her admission to the intensive care unit.  The tracheostomy has been in place since her original hospitalization in December 2016. It was briefly removed in May of 2017 but unfortunately she had a complication of laryngospasm and it sounds like the tracheostomy was replaced approximately 2 weeks later.  The tracheostomy was removed on 11/12/2015.  HPI Chief Complaint  Patient presents with  . Follow-up    pt c/o cold symptoms- intermittent low grade temp, secretions are white/green X2 days.     [name]'s family has been feeling ill this week.  She started having symptoms on Monday with a fever to 100 F, then yesterday she was lethargic and she had some secretions around her mouth and her trach stoma.  Her husband was suctioning her from time to time.  Her has been weak and she has had a little more difficulty coughing up the secretions.  She missed therapy yesterday due to the weakness.  She is brighter today than yesterday.  She didn't require suctioning  yesterday.     Past Medical History:  Diagnosis Date  . Arthritis    lt great toe  . Complication of anesthesia    pt has had headaches post op-did advise to hydra well preop  . Hair loss    right-sided  . History of exercise stress test    a. ETT (12/15) with 12:00 exercise, no ST changes, occasional PACs.   . Hyperlipidemia   . Insomnia   . Migraine headache   . 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 Postmenopausal   . Premature atrial contractions    a. Holter (12/15) with 8% PACs, no atrial runs or atrial fibrillation.       Review of Systems     Objective:   Physical Exam Vitals:   12/24/15 0954  BP: 124/66  Pulse: 89  Temp: 98.4 F (36.9 C)  TempSrc: Oral  SpO2: 93%    Gen: chronically ill appearing, in wheelchair HENT: OP clear but mucus membranes dry, neck supple, tracheostomy stoma patent with healthy appearing tissue, pinhole stoma noted PULM: CTA B, normal effort, no wheezing CV: RRR, no mgr, trace edema GI: BS+, soft, nontender Derm: no cyanosis or rash Psyche: normal mood and affect  Records from her visits with the rehab team reviewed     Assessment & Plan:  Tracheostomy status ([place]) Generally [name] has done well since having her tracheostomy removed. She's been able to clear most of her secretions without too much difficulty. Her tracheostomy stoma is still patent with a pinhole visible  today.  Plan: Continue current management of the stoma with a Band-Aid over the stoma Weekend visit the issue of a surgical correction of the stoma in April if it still patent 6 months after trach removal, but at this point I don't think there is a role for that  Acute tracheobronchitis [name] has an acute viral tracheobronchitis consistent with with been going on in our community and her family lately. Fortunately, today seems to be a better day. She does have some increase in secretions because of this right now. Her lungs are clear to auscultation and her  vital signs are normal today so there is no evidence of pneumonia.  Plan: Use guaifenesin over the next several days to 10 the secretions Increase fluid intake I gave a prescription for amoxicillin with instructions to use only if her secretions increase, her respiratory status changes she has recurrent fever, or her mental status changes. At that point we would also need to get a chest x-ray  > 50% of today's 30 minute visit face to face  Current Outpatient Prescriptions:  .  amantadine (SYMMETREL) 50 MG/5ML solution, Place 15 mLs (150 mg total) into feeding tube 2 (two) times daily., Disp: 900 mL, Rfl: 5 .  atorvastatin (LIPITOR) 20 MG tablet, TAKE ONE TABLET EACH DAY, Disp: 90 tablet, Rfl: 6 .  chlorhexidine (PERIDEX) 0.12 % solution, Use as directed 15 mLs in the mouth or throat 2 (two) times daily., Disp: 120 mL, Rfl: 6 .  clonazePAM (KLONOPIN) 0.5 MG tablet, Place 1 tablet (0.5 mg total) into feeding tube as directed. Take 0.25mg  at 0400 and 1400 and 0.5mg  at 2000, Disp: 60 tablet, Rfl: 3 .  Coenzyme Q10 (COQ-10 PO), Give by tube., Disp: , Rfl:  .  docusate (COLACE) 50 MG/5ML liquid, Take 10 mLs (100 mg total) by mouth daily., Disp: 300 mL, Rfl: 5 .  fluticasone (FLONASE) 50 MCG/ACT nasal spray, Place 2 sprays into both nostrils daily., Disp: 0.3 g, Rfl: 4 .  Lactobacillus Rhamnosus, GG, (PROBIOTIC COLIC) LIQD, Take by mouth every morning. , Disp: , Rfl:  .  loratadine (CLARITIN) 10 MG tablet, Place 10 mg into feeding tube daily., Disp: , Rfl:  .  Nystatin POWD, Apply topically to affected area, Disp: 60 g, Rfl: 0 .  traZODone (DESYREL) 50 MG tablet, Place 1-2 tablets (50-100 mg total) into feeding tube at bedtime., Disp: 60 tablet, Rfl: 4 .  valACYclovir (VALTREX) 500 MG tablet, Place 1 tablet (500 mg total) into feeding tube daily., Disp: 30 tablet, Rfl: 5 .  amoxicillin (AMOXIL) 400 MG/5ML suspension, Take 5 mLs (400 mg total) by mouth every 8 (eight) hours., Disp: 75 mL, Rfl: 0

## 2015-12-25 ENCOUNTER — Telehealth: Payer: Self-pay | Admitting: Physical Medicine & Rehabilitation

## 2015-12-25 ENCOUNTER — Ambulatory Visit: Payer: BC Managed Care – PPO | Admitting: Occupational Therapy

## 2015-12-25 ENCOUNTER — Encounter: Payer: Self-pay | Admitting: Occupational Therapy

## 2015-12-25 ENCOUNTER — Ambulatory Visit: Payer: BC Managed Care – PPO

## 2015-12-25 ENCOUNTER — Telehealth: Payer: Self-pay | Admitting: *Deleted

## 2015-12-25 ENCOUNTER — Ambulatory Visit: Payer: BC Managed Care – PPO | Admitting: Physical Therapy

## 2015-12-25 DIAGNOSIS — M6281 Muscle weakness (generalized): Secondary | ICD-10-CM

## 2015-12-25 DIAGNOSIS — R1312 Dysphagia, oropharyngeal phase: Secondary | ICD-10-CM

## 2015-12-25 DIAGNOSIS — R278 Other lack of coordination: Secondary | ICD-10-CM

## 2015-12-25 DIAGNOSIS — M25611 Stiffness of right shoulder, not elsewhere classified: Secondary | ICD-10-CM

## 2015-12-25 DIAGNOSIS — M25612 Stiffness of left shoulder, not elsewhere classified: Secondary | ICD-10-CM

## 2015-12-25 DIAGNOSIS — R2689 Other abnormalities of gait and mobility: Secondary | ICD-10-CM | POA: Diagnosis not present

## 2015-12-25 DIAGNOSIS — R471 Dysarthria and anarthria: Secondary | ICD-10-CM

## 2015-12-25 DIAGNOSIS — R41844 Frontal lobe and executive function deficit: Secondary | ICD-10-CM

## 2015-12-25 DIAGNOSIS — R2681 Unsteadiness on feet: Secondary | ICD-10-CM

## 2015-12-25 DIAGNOSIS — R41841 Cognitive communication deficit: Secondary | ICD-10-CM

## 2015-12-25 DIAGNOSIS — M25622 Stiffness of left elbow, not elsewhere classified: Secondary | ICD-10-CM

## 2015-12-25 NOTE — Telephone Encounter (Signed)
-----   Message from Mariah Milling, OT sent at 12/15/2015  9:56 AM EST ----- Regarding: Order for elbow splint Dr [name], I am interested in getting a JAS splint (static progressive) for Judy Spears - to promote increase elbow extension in left arm.  She was serial casted at [facility] and has 2 bivalved elbow splints that she can no longer wear - due to increase elbow flexion.  Can you please put in an order for [place] so I can set up the vendor to get her this brace, if you agree? Thanks so much! Antony Salmon, OTR/L 12/15/15 10:02 AM Phone: [phone] Fax: [phone]

## 2015-12-25 NOTE — Therapy (Signed)
[facility] [address] [place], [place], [address] Phone: [phone]   Fax:  [phone]  Occupational Therapy Treatment  Patient Details  Name: Judy Spears MRN: [id number] Date of Birth: [birth year] Referring Provider: Dr. Sharion Dove  Encounter Date: 12/25/2015      OT End of Session - 12/25/15 1456    Visit Number 38   Number of Visits 55   Date for OT Re-Evaluation 01/25/16   Authorization Type BCBS unlimited visits   OT Start Time [id number]  pt arrived late   OT Stop Time 1447   OT Time Calculation (min) 37 min      Past Medical History:  Diagnosis Date  . Arthritis    lt great toe  . Complication of anesthesia    pt has had headaches post op-did advise to hydra well preop  . Hair loss    right-sided  . History of exercise stress test    a. ETT (12/15) with 12:00 exercise, no ST changes, occasional PACs.   . Hyperlipidemia   . Insomnia   . Migraine headache   . 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 Postmenopausal   . Premature atrial contractions    a. Holter (12/15) with 8% PACs, no atrial runs or atrial fibrillation.     Past Surgical History:  Procedure Laterality Date  . BREAST BIOPSY  01/01/2011   Procedure: BREAST BIOPSY WITH NEEDLE LOCALIZATION;  Surgeon: Rolm Bookbinder, MD;  Location: [facility];  Service: General;  Laterality: Left;  left breast wire localization biopsy  . cataracts right eye    . CESAREAN SECTION  1986  . HERNIA REPAIR  2000   RIH  . opirectinal membrane peel    . RHINOPLASTY  1983    There were no vitals filed for this visit.      Subjective Assessment - 12/25/15 1450    Subjective  "yes" when asked is she felt bad (pt with a cold today)   Pertinent History see epic snapshot - ABI/hypoxia from tic bite/ virus   Patient Stated Goals Pt unable to state.    Currently in Pain? No/denies                      OT  Treatments/Exercises (OP) - 12/25/15 0001      Neurological Re-education Exercises   Other Exercises 1 Neuro re ed to address sit to stand, static standing with emphasis on thoracic extrension, elongation on L side of trunk, increased weight bearing on LLE with hip and knee extension, and lateral weight shifting to allow stepping/sliding of R foot.  Pt with active pulling into knee flexion when attempting to come up into standing.  WIth improved alignment pt can inconstistenly pushing into L hip and knee extension to facilitate more normal alignment in standing.  Pt's feels poorly today due to cold and this appears to have impacted session today.                   OT Short Term Goals - 12/25/15 1454      OT SHORT TERM GOAL #1   Title Pt and husband will be mod I with HEP - 12/18/2015   Status On-going     OT SHORT TERM GOAL #2   Title Pt will demonstrate adequate postural control for breath support to voice at least 2 words   Status Achieved     OT SHORT  TERM GOAL #3   Title Pt will transfer to commode with moderate assistance 25% of the time.   Status On-going     OT SHORT TERM GOAL #4   Title Pt will tolerate positional changes as evidenced by respiration rate and motor quieting in preparation for funcitonal mobility   Status Achieved           OT Long Term Goals - 12/25/15 1454      OT LONG TERM GOAL #1   Title Pt and husband will be mod I with upgraded HEP - 01/15/2016   Status On-going     OT LONG TERM GOAL #2   Title Pt will demonstrate adequate postural control to support 3 words in supported sitting.   Status On-going     OT LONG TERM GOAL #3   Title Pt will demonstrate ability for low reach with RUE for functional task with  mod facilitation   Status Achieved     OT LONG TERM GOAL #4   Title Pt will wash face with LUE with min a in supported sitting   Status Achieved     OT LONG TERM GOAL #5   Title Pt will release objects with L hand with min  faciltation    Status Achieved     OT LONG TERM GOAL #6   Title Pt will stand with min a while caregiver manages pants during LB dressing 75% of the time.   Status On-going     OT LONG TERM GOAL #7   Title Pt will transfer to commode with mod a 50% of the time.   Status On-going     OT LONG TERM GOAL #8   Title Pt will be able to sit unsupported with supervision 50% of the time while engaged in functional task.   Status On-going               Plan - 12/25/15 1454    Clinical Impression Statement Pt with very inconsistent, very slow progress toward goals. Progress impacted by severity of multiple deficits as well as time since injury.   Rehab Potential Fair   Clinical Impairments Affecting Rehab Potential severity of deficits, slow rate of recovery   OT Frequency 2x / week   OT Duration 8 weeks   OT Treatment/Interventions Self-care/ADL training;Ultrasound;Moist Heat;Electrical Stimulation;DME and/or AE instruction;Neuromuscular education;Therapeutic exercise;Functional Mobility Training;Manual Therapy;Passive range of motion;Splinting;Therapeutic activities;Balance training;Patient/family education;Visual/perceptual remediation/compensation;Cognitive remediation/compensation   Plan sit to stand with emphasis on lift off, stand balance, decrease use of UE"s for mobility, increase active use of LLE in standing.    Consulted and Agree with Plan of Care Patient;Family member/caregiver   Family Member Consulted caregiver [name], husband [name]       Patient will benefit from skilled therapeutic intervention in order to improve the following deficits and impairments:  Decreased endurance, Decreased skin integrity, Impaired vision/preception, Improper body mechanics, Impaired perceived functional ability, Decreased knowledge of precautions, Cardiopulmonary status limiting activity, Decreased activity tolerance, Decreased knowledge of use of DME, Decreased strength, Impaired flexibility,  Improper spinal/pelvic alignment, Impaired sensation, Difficulty walking, Decreased mobility, Decreased balance, Decreased cognition, Decreased range of motion, Impaired tone, Pain, Impaired UE functional use, Decreased safety awareness, Decreased coordination  Visit Diagnosis: Muscle weakness (generalized)  Other lack of coordination  Unsteadiness on feet  Stiffness of left shoulder, not elsewhere classified  Stiffness of right shoulder, not elsewhere classified  Stiffness of left elbow, not elsewhere classified  Frontal lobe and executive function deficit  Problem List Patient Active Problem List   Diagnosis Date Noted  . Acute tracheobronchitis 12/24/2015  . Tracheostomy tube present ([place])   . Tracheal stenosis   . Chronic respiratory failure with hypoxia ([place])   . 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 Tracheostomy status ([place]) 09/26/2015  . Allergic rhinitis 09/26/2015  . 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 Movement disorder 09/22/2015  . Encephalitis 09/22/2015  . Chest pain 02/07/2014  . Premature atrial contractions 01/13/2014  . Abnormality of gait 12/04/2013  . Hyperlipidemia 12/21/2011  . Cardiovascular risk factor 12/21/2011  . Bunion 01/31/2008  . Metatarsalgia of both feet 09/20/2007  . FLAT FOOT 09/20/2007  . HALLUX RIGIDUS, ACQUIRED 09/20/2007    Quay Burow, OTR/L 12/25/2015, 2:58 PM  [facility] [address] [place], [place], [address] Phone: [phone]   Fax:  [phone]  Name: Judy Spears MRN: [id number] Date of Birth: 04/15/1950

## 2015-12-25 NOTE — Telephone Encounter (Signed)
patients husband called and left a message stating that their handicap placard is expiring and would appreciate if we could fill out and sign a new application

## 2015-12-25 NOTE — Therapy (Signed)
[facility] [address] [place], [place], [address] Phone: [phone]   Fax:  [phone]  Physical Therapy Treatment  Patient Details  Name: Judy Spears MRN: [id number] Date of Birth: 01/04/1951 Referring Provider: Alger Simons, MD  Encounter Date: 12/25/2015      PT End of Session - 12/25/15 1931    Visit Number 39   Number of Visits 45   Date for PT Re-Evaluation 01/18/16   Authorization Type BCBS   PT Start Time 1445   PT Stop Time 1530   PT Time Calculation (min) 45 min      Past Medical History:  Diagnosis Date  . Arthritis    lt great toe  . Complication of anesthesia    pt has had headaches post op-did advise to hydra well preop  . Hair loss    right-sided  . History of exercise stress test    a. ETT (12/15) with 12:00 exercise, no ST changes, occasional PACs.   . Hyperlipidemia   . Insomnia   . Migraine headache   . 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 Postmenopausal   . Premature atrial contractions    a. Holter (12/15) with 8% PACs, no atrial runs or atrial fibrillation.     Past Surgical History:  Procedure Laterality Date  . BREAST BIOPSY  01/01/2011   Procedure: BREAST BIOPSY WITH NEEDLE LOCALIZATION;  Surgeon: Rolm Bookbinder, MD;  Location: [facility];  Service: General;  Laterality: Left;  left breast wire localization biopsy  . cataracts right eye    . CESAREAN SECTION  1986  . HERNIA REPAIR  2000   RIH  . opirectinal membrane peel    . RHINOPLASTY  1983    There were no vitals filed for this visit.      Subjective Assessment - 12/25/15 1925    Subjective Pt coughing, heavy breathing noted - caregiver reports she has a cold   Patient is accompained by: Family member   Pertinent History Precautions:  PEG.  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 No/denies                         [facility] Adult PT Treatment/Exercise - 12/25/15 1926      Bed Mobility   Bed Mobility Rolling Right;Rolling Left   Rolling Right 4: Min assist   Rolling Right Details (indicate cue type and reason) verbal cues to turn head to R side, bring L arm across chest and push iwth LLE   Rolling Left 3: Mod assist   Rolling Left Details (indicate cue type and reason) tactile cues at pelvis to intiate rolling   Supine to Sit 2: Max assist   Sit to Supine 2: Max assist     Transfers   Transfers Sit to Stand   Sit to Stand 3: Mod assist   Sit to Stand Details Tactile cues for placement;Tactile cues for weight shifting   Sit to Stand Details (indicate cue type and reason) performed sit to stand from mat table to [place]   Sit to Stand: Patient Percentage 50%   Stand to Sit 3: Mod assist  Stand to Sit Details (indicate cue type and reason) Manual facilitation for weight shifting;Tactile cues for weight shifting;Verbal cues for technique     NeuroRe-ed; worked on sitting balance - pt performed sit to 1/2 side sitting 3 reps on R and L forearm - with min to mod assist To fully weight bear on forearm  Trunk rotation to R and L sides  - touching PT and tech's knee with opposite hand with min to mod assist Bil., hands on R and L sides for trunk stretching and to fascilitate trunk rotation  Reaching from sitting down toward floor with return to upright - 3 reps with min assist; pt unable to touch either of her shoes  Pt transferred sit to stand in [place] with min to mod assist; stood for 1" with bil. UE support in [place] (with SBA)           PT Short Term Goals - 12/25/15 1935      PT SHORT TERM GOAL #1   Title Pt will consistently perform supine rolling to in  B directions with supervision, 25% cueing to indicate increased trunk dissociation, increased independence with bed mobility.  (Target date:12/17/15)    Baseline Can initiate with min A inconsistently with 50% cuing.:   12-25-15 - pt able to roll to R side with min assist, mod assist needed on initial 3 reps with rolling to L side    Status Not Met     PT SHORT TERM GOAL #2   Title Pt will consistently perform supine <> sit with min A, 25% cueing to indicate increased independence getting out of bed. (12/17/15)   Baseline 10/4: Requires min-mod A, 25-50% cueing from lowest mat table height;   12-25-15 mod to max assist needed on 12-25-15 with sitting up from L side   Status Not Met     PT SHORT TERM GOAL #3   Title Pt will demonstrate midline posture in static sitting with BLE support with 50% verbal cueing to indicate increased postural awareness/control.  (12/17/15)   Baseline met    Status Achieved     PT SHORT TERM GOAL #4   Title Pt will consistently perform level transfers from w/c <> mat table with mod A, 50% cueing to decrease caregiver burden.  (12/17/15)   Baseline transfers are currently being performed at home with use of Stedy; mod assist needed with sit to stand -12-25-15   Status Not Met     PT SHORT TERM GOAL #5   Title Pt will consistently perform sit <> stand from personal w/c with BUE support and mod A, 50% cueing to decrease caregiver burden.  (12/17/15)   Baseline 9/22: R sidelying > sit with mod A. 12-25-15 this transfer has not been assessed as of this current time   Status On-going     PT SHORT TERM GOAL #6   Title Pt will perform static standing with BUE support x1 minute with supervision, no overt LOB to decrease caregiver burden.  (12/17/15)   Baseline 10/4: Continues to require mod-max A ;  12-25-15  able to stand 1" in [place] with bil. UE support with SBA   Status On-going     PT SHORT TERM GOAL #7   Title PT will assess current wheelchair and determine if  alternative seating system would better maximize pt safety with functional mobility.  (12/17/15)   Baseline mod/max A on 10/23/15, max A cues for sequencing and hand placement.    Status On-going  PT Long Term Goals - 11/19/15 2034      PT LONG TERM GOAL #1   Title Pt will consistently perform supine <> sit with supervision, 25% cueing to indicate increased independence getting out of bed.   (Target: 01/14/16)   Time 8   Period Weeks   Status New     PT LONG TERM GOAL #2   Title Pt will demonstrate midline posture in static sitting with BLE support with subtle to min cueing to indicate increased postural awareness/control. (01/14/16)   Time 8   Period Weeks   Status New     PT LONG TERM GOAL #3   Title Pt will consistently perform level transfers from w/c <> mat table with min A, 25% cueing to decrease caregiver burden.  (01/14/16)   Time 8   Period Weeks   Status New     PT LONG TERM GOAL #4   Title Pt will consistently perform sit <> stand from personal w/c with BUE support and min A, 25% cueing to decrease caregiver burden.  (01/14/16)   Time 8   Period Weeks   Status New     PT LONG TERM GOAL #5   Title PT will assess gait to determine if ambulation is safe/functional.  (01/14/16)   Time 8   Period Weeks   Status New               Plan - 12/25/15 1931    Clinical Impression Statement Pt able to assist in scooting up to [facility] by bridging with assistance - required max assist to move upper body over toward middle of mat; pt did better with rolling to R side than to L side; increased tone in trunk limits trunk rotation and trunk flexion   Rehab Potential Fair   Clinical Impairments Affecting Rehab Potential Positive: strong family/social support; negative: chronicity of condition   PT Frequency 3x / week   PT Duration 4 weeks   PT Treatment/Interventions ADLs/Self Care Home Management;DME Instruction;Gait training;Stair training;Functional mobility  training;Therapeutic activities;Patient/family education;Cognitive remediation;Neuromuscular re-education;Therapeutic exercise;Balance training;Orthotic Fit/Training;Wheelchair mobility training;Manual techniques;Splinting;Visual/perceptual remediation/compensation;Vestibular;Passive range of motion   PT Next Visit Plan bed mobility training, try Sci[employer] ?  LE stretching and A/AROM:  standing in [place] with fascilitation into upright posture   Consulted and Agree with Plan of Care Patient;Family member/caregiver   Family Member Consulted caregiver, [name] and husband, [name]      Patient will benefit from skilled therapeutic intervention in order to improve the following deficits and impairments:  Abnormal gait, Decreased balance, Decreased endurance, Decreased cognition, Cardiopulmonary status limiting activity, Decreased activity tolerance, Decreased coordination, Decreased strength, Impaired flexibility, Impaired tone, Decreased mobility, Decreased skin integrity, Increased muscle spasms, Impaired vision/preception, Decreased range of motion, Impaired UE functional use, Postural dysfunction, Pain  Visit Diagnosis: Muscle weakness (generalized)  Other abnormalities of gait and mobility     Problem List Patient Active Problem List   Diagnosis Date Noted  . Acute tracheobronchitis 12/24/2015  . Tracheostomy tube present ([place])   . Tracheal stenosis   . Chronic respiratory failure with hypoxia ([place])   . 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 Tracheostomy status ([place]) 09/26/2015  . Allergic rhinitis 09/26/2015  . 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 Movement disorder 09/22/2015  . Encephalitis 09/22/2015  . Chest pain 02/07/2014  . Premature atrial contractions 01/13/2014  . Abnormality of gait 12/04/2013  . Hyperlipidemia 12/21/2011  . Cardiovascular risk factor 12/21/2011  . Bunion 01/31/2008  . Metatarsalgia of both feet 09/20/2007  .  FLAT FOOT 09/20/2007  . HALLUX RIGIDUS, ACQUIRED  09/20/2007    Alda Lea, PT 12/25/2015, 7:52 PM  [facility] [address] [place], [place], [address] Phone: [phone]   Fax:  [phone]  Name: Judy Spears MRN: [id number] Date of Birth: Jun 29, 1950

## 2015-12-26 ENCOUNTER — Encounter: Payer: Self-pay | Admitting: Rehabilitation

## 2015-12-26 ENCOUNTER — Ambulatory Visit: Payer: BC Managed Care – PPO | Attending: Physical Medicine & Rehabilitation | Admitting: Occupational Therapy

## 2015-12-26 ENCOUNTER — Ambulatory Visit: Payer: BC Managed Care – PPO

## 2015-12-26 ENCOUNTER — Telehealth: Payer: Self-pay | Admitting: Pulmonary Disease

## 2015-12-26 ENCOUNTER — Ambulatory Visit: Payer: BC Managed Care – PPO | Admitting: Rehabilitation

## 2015-12-26 VITALS — HR 96

## 2015-12-26 DIAGNOSIS — M25612 Stiffness of left shoulder, not elsewhere classified: Secondary | ICD-10-CM | POA: Diagnosis present

## 2015-12-26 DIAGNOSIS — R278 Other lack of coordination: Secondary | ICD-10-CM

## 2015-12-26 DIAGNOSIS — R293 Abnormal posture: Secondary | ICD-10-CM | POA: Diagnosis present

## 2015-12-26 DIAGNOSIS — R2681 Unsteadiness on feet: Secondary | ICD-10-CM | POA: Insufficient documentation

## 2015-12-26 DIAGNOSIS — M6281 Muscle weakness (generalized): Secondary | ICD-10-CM | POA: Insufficient documentation

## 2015-12-26 DIAGNOSIS — M25611 Stiffness of right shoulder, not elsewhere classified: Secondary | ICD-10-CM | POA: Diagnosis present

## 2015-12-26 DIAGNOSIS — R1312 Dysphagia, oropharyngeal phase: Secondary | ICD-10-CM

## 2015-12-26 DIAGNOSIS — R2689 Other abnormalities of gait and mobility: Secondary | ICD-10-CM | POA: Insufficient documentation

## 2015-12-26 DIAGNOSIS — R41841 Cognitive communication deficit: Secondary | ICD-10-CM | POA: Diagnosis present

## 2015-12-26 DIAGNOSIS — R41844 Frontal lobe and executive function deficit: Secondary | ICD-10-CM | POA: Diagnosis present

## 2015-12-26 DIAGNOSIS — R471 Dysarthria and anarthria: Secondary | ICD-10-CM | POA: Diagnosis present

## 2015-12-26 DIAGNOSIS — M25622 Stiffness of left elbow, not elsewhere classified: Secondary | ICD-10-CM | POA: Diagnosis present

## 2015-12-26 MED ORDER — FLUCONAZOLE 100 MG PO TABS: 100.0000 mg | ORAL_TABLET | Freq: Every day | ORAL | 0 refills | Status: DC

## 2015-12-26 NOTE — Therapy (Signed)
[facility] [address] [place], [place], [address] Phone: [phone]   Fax:  [phone]  Speech Language Pathology Treatment  Patient Details  Name: Judy Spears MRN: [id number] Date of Birth: 11-19-1950 Referring Provider: Dr. Alger Simons  Encounter Date: 12/26/2015      End of Session - 12/26/15 1731    Visit Number 34   Number of Visits 36   Date for SLP Re-Evaluation 01/02/16   SLP Start Time 1408   SLP Stop Time  1446   SLP Time Calculation (min) 38 min      Past Medical History:  Diagnosis Date  . Arthritis    lt great toe  . Complication of anesthesia    pt has had headaches post op-did advise to hydra well preop  . Hair loss    right-sided  . History of exercise stress test    a. ETT (12/15) with 12:00 exercise, no ST changes, occasional PACs.   . Hyperlipidemia   . Insomnia   . Migraine headache   . 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 Postmenopausal   . Premature atrial contractions    a. Holter (12/15) with 8% PACs, no atrial runs or atrial fibrillation.     Past Surgical History:  Procedure Laterality Date  . BREAST BIOPSY  01/01/2011   Procedure: BREAST BIOPSY WITH NEEDLE LOCALIZATION;  Surgeon: Rolm Bookbinder, MD;  Location: [facility];  Service: General;  Laterality: Left;  left breast wire localization biopsy  . cataracts right eye    . CESAREAN SECTION  1986  . HERNIA REPAIR  2000   RIH  . opirectinal membrane peel    . RHINOPLASTY  1983    Vitals:   12/26/15 1727  Pulse: 96  SpO2: (!) 82%        Subjective Assessment - 12/26/15 1726    Subjective Pt arrives again with yellowish/clear congestion from stoma, less than yesterday.   Patient is accompained by: Family member  [name] and lisa               ADULT SLP TREATMENT - 12/26/15 1720      General Information   Behavior/Cognition Cooperative;Pleasant  mood;Lethargic;Distractible;Decreased sustained attention     Treatment Provided   Treatment provided Cognitive-Linquistic     Pain Assessment   Pain Assessment No/denies pain     Cognitive-Linquistic Treatment   Treatment focused on Voice;Dysarthria   Skilled Treatment SLP faciliatated pt's attention in linguistic context today in divergent naming tasks. Simple categories (colors, months, days of the week) were completed within the first 15-30 seconds of request. Requests for girls' names, flowers, favorite TV shows resulted in pt closing her eyes and not responding to SLP despite multiple attempts at tactile, verbal and visual stimulation. MAx cues to total A were needed consistently if pt responded at all to stimuli SLP provided.      Assessment / Recommendations / Plan   Plan Continue with current plan of care     Progression Toward Goals   Progression toward goals Not progressing toward goals (comment)  likely due to illness - pt with 100.4 fever yesterday          SLP Education - 12/26/15 1730    Education provided Yes   Education Details home tasks for speech/language/cognition   Person(s) Educated Patient;Spouse;Caregiver(s)   Methods Explanation;Demonstration   Comprehension Verbalized understanding          SLP  Short Term Goals - 12/22/15 1627      SLP SHORT TERM GOAL #1   Title Pt will demonstrate audible phonation with words, vowels, or syllables in 4/10 trials with max A over three sessions   Baseline 10/30,   Status Revised     SLP SHORT TERM GOAL #2   Title Pt will demonstrate breath support with max A, prior to repetition of words 4/10   Status Achieved     SLP SHORT TERM GOAL #3   Title Pt will swallow ice chip/H20 via spoon in less than 7 seconds average with usual min to mod A 3/5 trials   Baseline 9/25, 10/30, 10/31   Status On-going     SLP SHORT TERM GOAL #4   Title Pt will sustain attention to therapy task for over 7 minutes with occasional  min A   Status Achieved          SLP Long Term Goals - 12/26/15 1733      SLP LONG TERM GOAL #1   Title Pt will imitate one to two syllables with voicing in upright position 50% of the time.   Time 2   Period Weeks   Status On-going     SLP LONG TERM GOAL #2   Title pt will demonstrate adequate breath support for speech 25% of the time with imitative responses and max cues usually (50-80%)   Time 2   Period Weeks   Status On-going     SLP LONG TERM GOAL #3   Title after oral care, pt will achieve initiation of oral phase of the swallow with ice chip/water trials with average <6 seconds over 4 sessions   Baseline 12-12-15   Time 2   Period Weeks   Status On-going          Plan - 12/26/15 1732    Clinical Impression Statement Pt's lethargy likely due to cold/illness hampered pt's efforts in ST today, Cont'd ST needed to address cognitive/linguistic/dysphagia goals.   Speech Therapy Frequency 3x / week   Duration 2 weeks   Treatment/Interventions Aspiration precaution training;Trials of upgraded texture/liquids;Compensatory strategies;Functional tasks;Patient/family education;Cueing hierarchy;Diet toleration management by SLP;Cognitive reorganization;Compensatory techniques;SLP instruction and feedback;Internal/external aids;Multimodal communcation approach   Potential to Achieve Goals Fair   Potential Considerations Ability to learn/carryover information;Co-morbidities;Severity of impairments   Consulted and Agree with Plan of Care Family member/caregiver      Patient will benefit from skilled therapeutic intervention in order to improve the following deficits and impairments:   Dysarthria and anarthria  Dysphagia, oropharyngeal phase  Cognitive communication deficit    Problem List Patient Active Problem List   Diagnosis Date Noted  . Acute tracheobronchitis 12/24/2015  . Tracheostomy tube present ([facility])   . Tracheal stenosis   . Chronic respiratory failure  with hypoxia ([facility])   . 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[facility]) 10/20/2015  . Tracheostomy status ([facility]) 09/26/2015  . Allergic rhinitis 09/26/2015  . 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 Movement disorder 09/22/2015  . Encephalitis 09/22/2015  . Chest pain 02/07/2014  . Premature atrial contractions 01/13/2014  . Abnormality of gait 12/04/2013  . Hyperlipidemia 12/21/2011  . Cardiovascular risk factor 12/21/2011  . Bunion 01/31/2008  . Metatarsalgia of both feet 09/20/2007  . FLAT FOOT 09/20/2007  . HALLUX RIGIDUS, ACQUIRED 09/20/2007    [facility] ,MS, CCC-SLP  12/26/2015, 5:35 PM  [facility] [address] [place], [place], [address] Phone: [phone]   Fax:  [phone]   Name: Judy Gowda  Spears MRN: [id number] Date of Birth: 01/29/1950

## 2015-12-26 NOTE — Therapy (Signed)
[facility] [address] [place], [place], [address] Phone: [phone]   Fax:  [phone]  Physical Therapy Treatment  Patient Details  Name: Judy Spears MRN: [id number] Date of Birth: 08-Jun-1950 Referring Provider: Alger Simons, MD  Encounter Date: 12/26/2015      PT End of Session - 12/26/15 1904    Visit Number 40   Number of Visits 45   Date for PT Re-Evaluation 01/18/16   Authorization Type BC[employer]   PT Start Time 1318   PT Stop Time 1405   PT Time Calculation (min) 47 min   Activity Tolerance Patient limited by lethargy;Patient limited by fatigue   Behavior During Therapy [facility] for tasks assessed/performed      Past Medical History:  Diagnosis Date  . Arthritis    lt great toe  . Complication of anesthesia    pt has had headaches post op-did advise to hydra well preop  . Hair loss    right-sided  . History of exercise stress test    a. ETT (12/15) with 12:00 exercise, no ST changes, occasional PACs.   . Hyperlipidemia   . Insomnia   . Migraine headache   . 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 Postmenopausal   . Premature atrial contractions    a. Holter (12/15) with 8% PACs, no atrial runs or atrial fibrillation.     Past Surgical History:  Procedure Laterality Date  . BREAST BIOPSY  01/01/2011   Procedure: BREAST BIOPSY WITH NEEDLE LOCALIZATION;  Surgeon: Rolm Bookbinder, MD;  Location: [facility];  Service: General;  Laterality: Left;  left breast wire localization biopsy  . cataracts right eye    . CESAREAN SECTION  1986  . HERNIA REPAIR  2000   RIH  . opirectinal membrane peel    . RHINOPLASTY  1983    Vitals:   12/26/15 1903  SpO2: (!) 77%        Subjective Assessment - 12/26/15 1902    Subjective Pt continues to have heavier breathing today, started antibiotic per caregiver.    Patient is accompained by: Family member   Pertinent History Precautions:   PEG.  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 No/denies                     NMR:  Skilled session focused on transitions into and out of tall kneeling as well as tall kneeling tasks to focus on forced BLE WB, active hip extension, trunk rotation, and improved postural control.  Pt demonstrates improved ability to reach neutral hip positioning during tasks, however continues to tend to pull into flexed position when not given UE support at or above shoulder level or when over facilitated posteriorly.  Worked on very small transitions from flexed position to full tall kneeling.  Pt does better with small range motion during session.  Also had pt work on lateral weight shifts in tall kneeling to improve weight shift to the L as well as forward/lateral L weight shift to better carryover to standing.  Pt does very well with visual target to move hip towards.  Attempted standing following tall kneeling tasks, however she  is unable to achieve full standing and requires +2A for standing.                PT Short Term Goals - 12/25/15 1935      PT SHORT TERM GOAL #1   Title Pt will consistently perform supine rolling to in B directions with supervision, 25% cueing to indicate increased trunk dissociation, increased independence with bed mobility.  (Target date:12/17/15)    Baseline Can initiate with min A inconsistently with 50% cuing.:   12-25-15 - pt able to roll to R side with min assist, mod assist needed on initial 3 reps with rolling to L side    Status Not Met     PT SHORT TERM GOAL #2   Title Pt will consistently perform supine <> sit with min A, 25% cueing to indicate increased  independence getting out of bed. (12/17/15)   Baseline 10/4: Requires min-mod A, 25-50% cueing from lowest mat table height;   12-25-15 mod to max assist needed on 12-25-15 with sitting up from L side   Status Not Met     PT SHORT TERM GOAL #3   Title Pt will demonstrate midline posture in static sitting with BLE support with 50% verbal cueing to indicate increased postural awareness/control.  (12/17/15)   Baseline met    Status Achieved     PT SHORT TERM GOAL #4   Title Pt will consistently perform level transfers from w/c <> mat table with mod A, 50% cueing to decrease caregiver burden.  (12/17/15)   Baseline transfers are currently being performed at home with use of St[place]; mod assist needed with sit to stand -12-25-15   Status Not Met     PT SHORT TERM GOAL #5   Title Pt will consistently perform sit <> stand from personal w/c with BUE support and mod A, 50% cueing to decrease caregiver burden.  (12/17/15)   Baseline 9/22: R sidelying > sit with mod A. 12-25-15 this transfer has not been assessed as of this current time   Status On-going     PT SHORT TERM GOAL #6   Title Pt will perform static standing with BUE support x1 minute with supervision, no overt LOB to decrease caregiver burden.  (12/17/15)   Baseline 10/4: Continues to require mod-max A ;  12-25-15  able to stand 1" in [place] with bil. UE support with SBA   Status On-going     PT SHORT TERM GOAL #7   Title PT will assess current wheelchair and determine if alternative seating system would better maximize pt safety with functional mobility.  (12/17/15)   Baseline mod/max A on 10/23/15, max A cues for sequencing and hand placement.    Status On-going           PT Long Term Goals - 11/19/15 2034      PT LONG TERM GOAL #1   Title Pt will consistently perform supine <> sit with supervision, 25% cueing to indicate increased independence getting out of bed.   (Target: 01/14/16)   Time 8   Period Weeks   Status New      PT LONG TERM GOAL #2   Title Pt will demonstrate midline posture in static sitting with BLE support with subtle to min cueing to indicate increased postural awareness/control. (01/14/16)   Time 8   Period Weeks   Status New     PT LONG TERM GOAL #3   Title Pt will consistently perform level transfers from  w/c <> mat table with min A, 25% cueing to decrease caregiver burden.  (01/14/16)   Time 8   Period Weeks   Status New     PT LONG TERM GOAL #4   Title Pt will consistently perform sit <> stand from personal w/c with BUE support and min A, 25% cueing to decrease caregiver burden.  (01/14/16)   Time 8   Period Weeks   Status New     PT LONG TERM GOAL #5   Title PT will assess gait to determine if ambulation is safe/functional.  (01/14/16)   Time 8   Period Weeks   Status New               Plan - 12/26/15 1905    Clinical Impression Statement Pt able to achieve increased hip extension (both active and passive) during session in tall kneeling as well as improve forward and lateral weight shifts with increased time and max verbal and visual cues.  Continues to require total A for squat pivot transfers during session.     Rehab Potential Fair   Clinical Impairments Affecting Rehab Potential Positive: strong family/social support; negative: chronicity of condition   PT Frequency 3x / week   PT Duration 4 weeks   PT Treatment/Interventions ADLs/Self Care Home Management;DME Instruction;Gait training;Stair training;Functional mobility training;Therapeutic activities;Patient/family education;Cognitive remediation;Neuromuscular re-education;Therapeutic exercise;Balance training;Orthotic Fit/Training;Wheelchair mobility training;Manual techniques;Splinting;Visual/perceptual remediation/compensation;Vestibular;Passive range of motion   PT Next Visit Plan bed mobility training, try SciFit ?  [place] Level I didn't do this bc I had help from [place]) LE stretching and A/AROM:  standing in [place]  with fascilitation into upright posture   Consulted and Agree with Plan of Care Patient;Family member/caregiver   Family Member Consulted caregiver, [name] and husband, [name]      Patient will benefit from skilled therapeutic intervention in order to improve the following deficits and impairments:  Abnormal gait, Decreased balance, Decreased endurance, Decreased cognition, Cardiopulmonary status limiting activity, Decreased activity tolerance, Decreased coordination, Decreased strength, Impaired flexibility, Impaired tone, Decreased mobility, Decreased skin integrity, Increased muscle spasms, Impaired vision/preception, Decreased range of motion, Impaired UE functional use, Postural dysfunction, Pain  Visit Diagnosis: Abnormal posture  Muscle weakness (generalized)  Other lack of coordination  Other abnormalities of gait and mobility  Unsteadiness on feet     Problem List Patient Active Problem List   Diagnosis Date Noted  . Acute tracheobronchitis 12/24/2015  . Tracheostomy tube present ([place] [facility])   . Tracheal stenosis   . Chronic respiratory failure with hypoxia ([place])   . 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[place] [place]) 10/20/2015  . Tracheostomy status ([facility] [facility]) 09/26/2015  . Allergic rhinitis 09/26/2015  . 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 Movement disorder 09/22/2015  . Encephalitis 09/22/2015  . Chest pain 02/07/2014  . Premature atrial contractions 01/13/2014  . Abnormality of gait 12/04/2013  . Hyperlipidemia 12/21/2011  . Cardiovascular risk factor 12/21/2011  . Bunion 01/31/2008  . Metatarsalgia of both feet 09/20/2007  . FLAT FOOT 09/20/2007  . HALLUX RIGIDUS, ACQUIRED 09/20/2007    Cameron Sprang, PT, MPT [facility] [address] [place], [place], [address] Phone: [phone]   Fax:  [phone] 12/26/15, 7:25 PM  Name: Judy Spears MRN: [id number] Date of Birth: 1950/09/17

## 2015-12-26 NOTE — Telephone Encounter (Signed)
Handicap form placed in Dr [name] in box to be signed.

## 2015-12-26 NOTE — Telephone Encounter (Signed)
pts husband is wanting to know if MW would be willing to call in diflucan for the pt---she typically gets yeast infection when she takes the abx.  MW please advise. Thanks  Allergies  Allergen Reactions  . Pollen Extract Other (See Comments)    Running nose  . Tetracyclines & Related     Blisters, photosensitivity

## 2015-12-26 NOTE — Telephone Encounter (Signed)
If already stated abx then would wait until first of week and let us know if not improving on what she was given and then decide re ov/ cxr etc

## 2015-12-26 NOTE — Telephone Encounter (Signed)
Ok diflucan 100 mg #3 take one after antibiotics complete, repeat daily x 2 days if needed

## 2015-12-26 NOTE — Therapy (Signed)
[facility] [address] [place], [place], [address] Phone: [phone]   Fax:  [phone]  Speech Language Pathology Treatment  Patient Details  Name: Judy Spears MRN: [id number] Date of Birth: September 23, 1950 Referring Provider: Dr. Alger Simons  Encounter Date: 12/25/2015      End of Session - 12/26/15 1707    Visit Number 33   Number of Visits 36   Date for SLP Re-Evaluation 01/02/16   SLP Start Time [id number]   SLP Stop Time  1616   SLP Time Calculation (min) 41 min   Activity Tolerance Patient limited by lethargy;Patient limited by fatigue      Past Medical History:  Diagnosis Date  . Arthritis    lt great toe  . Complication of anesthesia    pt has had headaches post op-did advise to hydra well preop  . Hair loss    right-sided  . History of exercise stress test    a. ETT (12/15) with 12:00 exercise, no ST changes, occasional PACs.   . Hyperlipidemia   . Insomnia   . Migraine headache   . 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 Postmenopausal   . Premature atrial contractions    a. Holter (12/15) with 8% PACs, no atrial runs or atrial fibrillation.     Past Surgical History:  Procedure Laterality Date  . BREAST BIOPSY  01/01/2011   Procedure: BREAST BIOPSY WITH NEEDLE LOCALIZATION;  Surgeon: Rolm Bookbinder, MD;  Location: [facility];  Service: General;  Laterality: Left;  left breast wire localization biopsy  . cataracts right eye    . CESAREAN SECTION  1986  . HERNIA REPAIR  2000   RIH  . opirectinal membrane peel    . RHINOPLASTY  1983    There were no vitals filed for this visit.      Subjective Assessment - 12/26/15 1654    Subjective Pt arrives with yellowish/clear congestion from stoma   Patient is accompained by: Family member  [name] and lisa               ADULT SLP TREATMENT - 12/25/15 1656      General Information   Behavior/Cognition  Cooperative;Lethargic;Distractible;Decreased sustained attention     Treatment Provided   Treatment provided Cognitive-Linquistic;Dysphagia     Dysphagia Treatment   Temperature Spikes Noted Yes   Respiratory Status Increased Respiratory rate   Oral Cavity - Dentition Adequate natural dentition   Treatment Methods Skilled observation;Therapeutic exercise   Patient observed directly with PO's Yes   Type of PO's observed Ice chips   Feeding Needs assist;Total assist   Oral Phase Signs & Symptoms Prolonged bolus formation;Oral holding  last 2-3 boluses   Pharyngeal Phase Signs & Symptoms Suspected delayed swallow initiation;Wet vocal quality  delay last two boluses, wet voice last bolus   Type of cueing Verbal;Tactile;Visual   Amount of cueing Maximal  last 2-3 boluses   Other treatment/comments Pt was at pulmonologist yesterday, lungs CTA. Pt took ice chips x6, with delayed swallow of 2-7 seconds with first 4 boluses (increasing delay with each bolus), and no swallow response with last two boluses. SLP terminated swallowing therapy at this time.      Cognitive-Linquistic Treatment   Treatment focused on Voice;Dysarthria   Skilled Treatment [place]ze phrases mouthed correctly 98% success, FUNCTIONAL voice heard with SLP occluding trach site/stoma 30% of the time. Counting with voice with SLP occluding trachsite/stoma 55% of the  time. Pt lethargic today, requiring multiple attempts to wake pt  with tactile, verbal stimulation successful in waking pt for 15 seconds to 3 minutes. Pt answered with incorrect answers to personal "wh" questions re: family members' birthdays next week after being reminded/told 1 minute earlier.     Assessment / Recommendations / Plan   Plan Continue with current plan of care     Dysphagia Recommendations   Diet recommendations NPO  1/2 teaspoons H2O or ice chips with oral care <30 minutes   Liquids provided via Teaspoon   Medication Administration Via alternative  means     Progression Toward Goals   Progression toward goals Not progressing toward goals (comment)  likely secondary to illness            SLP Short Term Goals - 12/22/15 1627      SLP SHORT TERM GOAL #1   Title Pt will demonstrate audible phonation with words, vowels, or syllables in 4/10 trials with max A over three sessions   Baseline 10/30,   Status Revised     SLP SHORT TERM GOAL #2   Title Pt will demonstrate breath support with max A, prior to repetition of words 4/10   Status Achieved     SLP SHORT TERM GOAL #3   Title Pt will swallow ice chip/H20 via spoon in less than 7 seconds average with usual min to mod A 3/5 trials   Baseline 9/25, 10/30, 10/31   Status On-going     SLP SHORT TERM GOAL #4   Title Pt will sustain attention to therapy task for over 7 minutes with occasional min A   Status Achieved          SLP Long Term Goals - 12/26/15 1715      SLP LONG TERM GOAL #1   Title Pt will imitate one to two syllables with voicing in upright position 50% of the time.   Time 2   Period Weeks   Status On-going     SLP LONG TERM GOAL #2   Title pt will demonstrate adequate breath support for speech 25% of the time with imitative responses and max cues usually (50-80%)   Time 2   Period Weeks   Status On-going     SLP LONG TERM GOAL #3   Title after oral care, pt will achieve initiation of oral phase of the swallow with ice chip/water trials with average <6 seconds over 4 sessions   Baseline 12-12-15   Time 2   Period Weeks   Status On-going          Plan - 12/25/15 1714    Clinical Impression Statement Initially improved automatic swallow, however timeliness diminished as the session increased and anterior spillage increased. Ongoing SLP intervention indicated for communication and swallowing.    Speech Therapy Frequency 3x / week   Duration 2 weeks   Treatment/Interventions Aspiration precaution training;Trials of upgraded  texture/liquids;Compensatory strategies;Functional tasks;Patient/family education;Cueing hierarchy;Diet toleration management by SLP;Cognitive reorganization;Compensatory techniques;SLP instruction and feedback;Internal/external aids;Multimodal communcation approach   Potential to Achieve Goals Fair   Potential Considerations Ability to learn/carryover information;Co-morbidities;Severity of impairments   Consulted and Agree with Plan of Care Family member/caregiver      Patient will benefit from skilled therapeutic intervention in order to improve the following deficits and impairments:   Dysarthria and anarthria  Dysphagia, oropharyngeal phase  Cognitive communication deficit    Problem List Patient Active Problem List   Diagnosis Date Noted  . Acute tracheobronchitis  12/24/2015  . Tracheostomy tube present ([place] [facility])   . Tracheal stenosis   . Chronic respiratory failure with hypoxia ([place])   . 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[place]) 10/20/2015  . Tracheostomy status ([place]) 09/26/2015  . Allergic rhinitis 09/26/2015  . 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 Movement disorder 09/22/2015  . Encephalitis 09/22/2015  . Chest pain 02/07/2014  . Premature atrial contractions 01/13/2014  . Abnormality of gait 12/04/2013  . Hyperlipidemia 12/21/2011  . Cardiovascular risk factor 12/21/2011  . Bunion 01/31/2008  . Metatarsalgia of both feet 09/20/2007  . FLAT FOOT 09/20/2007  . HALLUX RIGIDUS, ACQUIRED 09/20/2007    [facility] ,[place], CCC-SLP  12/26/2015, 5:20 PM  [facility] [address] [place], [place], [address] Phone: [phone]   Fax:  [phone]   Name: Judy Spears MRN: [id number] Date of Birth: 1950/11/12

## 2015-12-26 NOTE — Telephone Encounter (Signed)
Order written and faxed to Marlowe Sax OT.

## 2015-12-26 NOTE — Telephone Encounter (Signed)
Called and spoke with pts husband and he is aware of rx that has been sent in for the diflucan.  Nothing further is needed.

## 2015-12-26 NOTE — Telephone Encounter (Signed)
Called and lmom to make Mr. Judy Spears aware of MW recs.  Advised to call for any other issues. Will forward to BQ to make him aware.

## 2015-12-26 NOTE — Telephone Encounter (Signed)
Called and spoke with pts husband and he stated that the pt was running a fever of 100.4 yesterday so they started her on the abx last night.  She was also a little lethargic yesterday as well.  He stated that they did give her the abx again this morning. She is having a raspy noise in her throat when she breaths.  [name] didn't know if BQ wanted to get a CXR or wait.  BQ is out of the office today.  Will forward to MW to advise. thanks  Allergies  Allergen Reactions  . Pollen Extract Other (See Comments)    Running nose  . Tetracyclines & Related     Blisters, photosensitivity

## 2015-12-26 NOTE — Telephone Encounter (Signed)
Remind me next week and we'll take care of it

## 2015-12-26 NOTE — Patient Instructions (Signed)
You all can do these types of tasks at home - naming things in common categories for increasing attention/concentration  Remember that you have to complete oral care <30 minutes prior to providing pt water/POs

## 2015-12-26 NOTE — Telephone Encounter (Signed)
Can someone hand write an order for this over a the office so we can get this out today?  Thanks!

## 2015-12-27 ENCOUNTER — Encounter: Payer: Self-pay | Admitting: Occupational Therapy

## 2015-12-27 NOTE — Therapy (Signed)
[facility] [address] [place], [place], [address] Phone: [phone]   Fax:  [phone]  Occupational Therapy Treatment  Patient Details  Name: Judy Spears MRN: [id number] Date of Birth: December 28, 1950 Referring Provider: Dr. Sharion Dove  Encounter Date: 12/26/2015      OT End of Session - 12/27/15 0736    Visit Number 39   Number of Visits 55   Date for OT Re-Evaluation 01/25/16   Authorization Type [employer] unlimited visits   OT Start Time 1448   OT Stop Time 1530   OT Time Calculation (min) 42 min   Activity Tolerance Patient limited by fatigue   Behavior During Therapy --  Less participative than normal - patient with  recent illness - upper respiratory infection      Past Medical History:  Diagnosis Date  . Arthritis    lt great toe  . Complication of anesthesia    pt has had headaches post op-did advise to hydra well preop  . Hair loss    right-sided  . History of exercise stress test    a. ETT (12/15) with 12:00 exercise, no ST changes, occasional PACs.   . Hyperlipidemia   . Insomnia   . Migraine headache   . 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 Postmenopausal   . Premature atrial contractions    a. Holter (12/15) with 8% PACs, no atrial runs or atrial fibrillation.     Past Surgical History:  Procedure Laterality Date  . BREAST BIOPSY  01/01/2011   Procedure: BREAST BIOPSY WITH NEEDLE LOCALIZATION;  Surgeon: Rolm Bookbinder, MD;  Location: [place] [facility];  Service: General;  Laterality: Left;  left breast wire localization biopsy  . cataracts right eye    . CESAREAN SECTION  1986  . HERNIA REPAIR  2000   RIH  . opirectinal membrane peel    . RHINOPLASTY  1983    There were no vitals filed for this visit.      Subjective Assessment - 12/27/15 0728    Subjective  Patient continues with upper respiratory infection - per caregiver started on antibiotics yesterday.    Patient is accompained by: Family member   Pertinent History see epic snapshot - ABI/hypoxia from tic bite/ virus   Patient Stated Goals Pt unable to state.    Currently in Pain? No/denies   Pain Score 0-No pain                      OT Treatments/Exercises (OP) - 12/27/15 0001      Neurological Re-education Exercises   Other Exercises 1 Neuro reeducation to address postural control.  Patient with active rigidity throughout body, very marked throughout trunk - worked on increasing passive then active trunk mobility while seated on physioball to aide in transitional movements.  Attempted components of sit to stand transition from physioball, patient with decreased LE activation (without AFO's) however, weight shift forward continues to improve.  Patient much more aligned symmetrically after trunk preparation.  Patient warm at end of session, becoming less participative - left calf mildly swollen just above ankle.  Husband and caregiver aware - present through entire session.  Patient with o2 sat readings at 78% .  Husband indicated he took reading at home due to URI - and reading was 90% - will monitor as warranted.  OT Short Term Goals - 12/25/15 1454      OT SHORT TERM GOAL #1   Title Pt and husband will be mod I with HEP - 12/18/2015   Status On-going     OT SHORT TERM GOAL #2   Title Pt will demonstrate adequate postural control for breath support to voice at least 2 words   Status Achieved     OT SHORT TERM GOAL #3   Title Pt will transfer to commode with moderate assistance 25% of the time.   Status On-going     OT SHORT TERM GOAL #4   Title Pt will tolerate positional changes as evidenced by respiration rate and motor quieting in preparation for funcitonal mobility   Status Achieved           OT Long Term Goals - 12/25/15 1454      OT LONG TERM GOAL #1   Title Pt and husband will be mod I with upgraded HEP - 01/15/2016   Status  On-going     OT LONG TERM GOAL #2   Title Pt will demonstrate adequate postural control to support 3 words in supported sitting.   Status On-going     OT LONG TERM GOAL #3   Title Pt will demonstrate ability for low reach with RUE for functional task with  mod facilitation   Status Achieved     OT LONG TERM GOAL #4   Title Pt will wash face with LUE with min a in supported sitting   Status Achieved     OT LONG TERM GOAL #5   Title Pt will release objects with L hand with min faciltation    Status Achieved     OT LONG TERM GOAL #6   Title Pt will stand with min a while caregiver manages pants during LB dressing 75% of the time.   Status On-going     OT LONG TERM GOAL #7   Title Pt will transfer to commode with mod a 50% of the time.   Status On-going     OT LONG TERM GOAL #8   Title Pt will be able to sit unsupported with supervision 50% of the time while engaged in functional task.   Status On-going               Plan - 12/27/15 0737    Clinical Impression Statement Patient has had a difficult week in terms of therapy progress due to illness.     Rehab Potential Fair   Clinical Impairments Affecting Rehab Potential severity of deficits, slow rate of recovery   OT Frequency 2x / week  3 x/week  weaning to 2 x week   OT Duration 8 weeks   OT Treatment/Interventions Self-care/ADL training;Ultrasound;Moist Heat;Electrical Stimulation;DME and/or AE instruction;Neuromuscular education;Therapeutic exercise;Functional Mobility Training;Manual Therapy;Passive range of motion;Splinting;Therapeutic activities;Balance training;Patient/family education;Visual/perceptual remediation/compensation;Cognitive remediation/compensation   Plan trunk mobility in sitting, trunk alignment in standing, sit to stand transitions, increase use of LE's, JAS splint fitting if available - order received 12/26/15   Consulted and Agree with Plan of Care Patient;Family member/caregiver   Family Member  Consulted caregiver [name], husband [name]       Patient will benefit from skilled therapeutic intervention in order to improve the following deficits and impairments:  Decreased endurance, Decreased skin integrity, Impaired vision/preception, Improper body mechanics, Impaired perceived functional ability, Decreased knowledge of precautions, Cardiopulmonary status limiting activity, Decreased activity tolerance, Decreased knowledge of use of DME, Decreased strength, Impaired flexibility, Improper spinal/pelvic  alignment, Impaired sensation, Difficulty walking, Decreased mobility, Decreased balance, Decreased cognition, Decreased range of motion, Impaired tone, Pain, Impaired UE functional use, Decreased safety awareness, Decreased coordination  Visit Diagnosis: Muscle weakness (generalized)  Other lack of coordination  Unsteadiness on feet  Stiffness of left shoulder, not elsewhere classified  Stiffness of right shoulder, not elsewhere classified  Stiffness of left elbow, not elsewhere classified  Frontal lobe and executive function deficit  Abnormal posture    Problem List Patient Active Problem List   Diagnosis Date Noted  . Acute tracheobronchitis 12/24/2015  . Tracheostomy tube present ([place])   . Tracheal stenosis   . Chronic respiratory failure with hypoxia ([place])   . 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 Tracheostomy status ([place]) 09/26/2015  . Allergic rhinitis 09/26/2015  . 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 Movement disorder 09/22/2015  . Encephalitis 09/22/2015  . Chest pain 02/07/2014  . Premature atrial contractions 01/13/2014  . Abnormality of gait 12/04/2013  . Hyperlipidemia 12/21/2011  . Cardiovascular risk factor 12/21/2011  . Bunion 01/31/2008  . Metatarsalgia of both feet 09/20/2007  . FLAT FOOT 09/20/2007  . HALLUX RIGIDUS, ACQUIRED 09/20/2007    Mariah Milling, OTR/L 12/27/2015, 7:44 AM  [facility] [address] [place], [place], [address] Phone: [phone]   Fax:  [phone]  Name: Judy Spears MRN: [id number] Date of Birth: 01-31-1950

## 2015-12-28 ENCOUNTER — Encounter (HOSPITAL_COMMUNITY): Payer: Self-pay | Admitting: Emergency Medicine

## 2015-12-28 ENCOUNTER — Inpatient Hospital Stay (HOSPITAL_COMMUNITY)
Admission: EM | Admit: 2015-12-28 | Discharge: 2016-01-05 | DRG: 004 | Disposition: A | Discharge status: Other Acute Inpatient Hospital | Payer: BC Managed Care – PPO | Attending: Internal Medicine | Admitting: Internal Medicine

## 2015-12-28 ENCOUNTER — Inpatient Hospital Stay (HOSPITAL_COMMUNITY): Payer: BC Managed Care – PPO

## 2015-12-28 ENCOUNTER — Emergency Department (HOSPITAL_COMMUNITY): Payer: BC Managed Care – PPO

## 2015-12-28 DIAGNOSIS — I10 Essential (primary) hypertension: Secondary | ICD-10-CM | POA: Diagnosis present

## 2015-12-28 DIAGNOSIS — R0902 Hypoxemia: Secondary | ICD-10-CM

## 2015-12-28 DIAGNOSIS — Z931 Gastrostomy status: Secondary | ICD-10-CM

## 2015-12-28 DIAGNOSIS — Z79899 Other long term (current) drug therapy: Secondary | ICD-10-CM

## 2015-12-28 DIAGNOSIS — E870 Hyperosmolality and hypernatremia: Secondary | ICD-10-CM | POA: Diagnosis present

## 2015-12-28 DIAGNOSIS — E876 Hypokalemia: Secondary | ICD-10-CM | POA: Diagnosis present

## 2015-12-28 DIAGNOSIS — R4701 Aphasia: Secondary | ICD-10-CM | POA: Diagnosis present

## 2015-12-28 DIAGNOSIS — J9621 Acute and chronic respiratory failure with hypoxia: Secondary | ICD-10-CM

## 2015-12-28 DIAGNOSIS — R293 Abnormal posture: Secondary | ICD-10-CM

## 2015-12-28 DIAGNOSIS — A403 Sepsis due to Streptococcus pneumoniae: Secondary | ICD-10-CM | POA: Diagnosis not present

## 2015-12-28 DIAGNOSIS — R739 Hyperglycemia, unspecified: Secondary | ICD-10-CM | POA: Diagnosis present

## 2015-12-28 DIAGNOSIS — G40901 Epilepsy, unspecified, not intractable, with status epilepticus: Secondary | ICD-10-CM | POA: Diagnosis not present

## 2015-12-28 DIAGNOSIS — J151 Pneumonia due to Pseudomonas: Secondary | ICD-10-CM | POA: Diagnosis present

## 2015-12-28 DIAGNOSIS — G839 Paralytic syndrome, unspecified: Secondary | ICD-10-CM | POA: Diagnosis present

## 2015-12-28 DIAGNOSIS — A408 Other streptococcal sepsis: Secondary | ICD-10-CM | POA: Diagnosis not present

## 2015-12-28 DIAGNOSIS — A419 Sepsis, unspecified organism: Secondary | ICD-10-CM

## 2015-12-28 DIAGNOSIS — M24552 Contracture, left hip: Secondary | ICD-10-CM | POA: Diagnosis present

## 2015-12-28 DIAGNOSIS — E785 Hyperlipidemia, unspecified: Secondary | ICD-10-CM | POA: Diagnosis present

## 2015-12-28 DIAGNOSIS — A4152 Sepsis due to Pseudomonas: Secondary | ICD-10-CM | POA: Diagnosis not present

## 2015-12-28 DIAGNOSIS — Z8661 Personal history of infections of the central nervous system: Secondary | ICD-10-CM | POA: Diagnosis not present

## 2015-12-28 DIAGNOSIS — Y95 Nosocomial condition: Secondary | ICD-10-CM | POA: Diagnosis present

## 2015-12-28 DIAGNOSIS — R52 Pain, unspecified: Secondary | ICD-10-CM | POA: Diagnosis not present

## 2015-12-28 DIAGNOSIS — J189 Pneumonia, unspecified organism: Secondary | ICD-10-CM | POA: Diagnosis not present

## 2015-12-28 DIAGNOSIS — R569 Unspecified convulsions: Secondary | ICD-10-CM

## 2015-12-28 DIAGNOSIS — J9601 Acute respiratory failure with hypoxia: Secondary | ICD-10-CM | POA: Diagnosis not present

## 2015-12-28 DIAGNOSIS — G9340 Encephalopathy, unspecified: Secondary | ICD-10-CM

## 2015-12-28 DIAGNOSIS — G4700 Insomnia, unspecified: Secondary | ICD-10-CM | POA: Diagnosis present

## 2015-12-28 DIAGNOSIS — M1990 Unspecified osteoarthritis, unspecified site: Secondary | ICD-10-CM | POA: Diagnosis present

## 2015-12-28 DIAGNOSIS — M6281 Muscle weakness (generalized): Secondary | ICD-10-CM

## 2015-12-28 DIAGNOSIS — J15211 Pneumonia due to Methicillin susceptible Staphylococcus aureus: Secondary | ICD-10-CM

## 2015-12-28 DIAGNOSIS — J9610 Chronic respiratory failure, unspecified whether with hypoxia or hypercapnia: Secondary | ICD-10-CM | POA: Diagnosis present

## 2015-12-28 LAB — I-STAT ARTERIAL BLOOD GAS, ED
Acid-Base Excess: 2 mmol/L (ref 0.0–2.0)
BICARBONATE: 28.1 mmol/L — AB (ref 20.0–28.0)
O2 SAT: 96 %
PCO2 ART: 51.3 mmHg — AB (ref 32.0–48.0)
TCO2: 30 mmol/L (ref 0–100)
pH, Arterial: 7.353 (ref 7.350–7.450)
pO2, Arterial: 91 mmHg (ref 83.0–108.0)

## 2015-12-28 LAB — COMPREHENSIVE METABOLIC PANEL
ALT: 129 U/L — AB (ref 14–54)
ALT: 122 U/L — ABNORMAL HIGH (ref 14–54)
ANION GAP: 10 (ref 5–15)
AST: 69 U/L — ABNORMAL HIGH (ref 15–41)
AST: 83 U/L — ABNORMAL HIGH (ref 15–41)
Albumin: 2.8 g/dL — ABNORMAL LOW (ref 3.5–5.0)
Albumin: 2.5 g/dL — ABNORMAL LOW (ref 3.5–5.0)
Alkaline Phosphatase: 131 U/L — ABNORMAL HIGH (ref 38–126)
Alkaline Phosphatase: 109 U/L (ref 38–126)
Anion gap: 13 (ref 5–15)
BUN: 11 mg/dL (ref 6–20)
BUN: 10 mg/dL (ref 6–20)
CHLORIDE: 87 mmol/L — AB (ref 101–111)
CHLORIDE: 93 mmol/L — AB (ref 101–111)
CO2: 31 mmol/L (ref 22–32)
CO2: 27 mmol/L (ref 22–32)
CREATININE: 0.37 mg/dL — AB (ref 0.44–1.00)
Calcium: 9.3 mg/dL (ref 8.9–10.3)
Calcium: 8.9 mg/dL (ref 8.9–10.3)
Creatinine, Ser: 0.4 mg/dL — ABNORMAL LOW (ref 0.44–1.00)
GFR calc non Af Amer: >60 mL/min (ref >60)
GFR calc non Af Amer: >60 mL/min (ref >60)
Glucose, Bld: 206 mg/dL — ABNORMAL HIGH (ref 65–99)
Glucose, Bld: 134 mg/dL — ABNORMAL HIGH (ref 65–99)
POTASSIUM: 3.5 mmol/L (ref 3.5–5.1)
POTASSIUM: 3.3 mmol/L — AB (ref 3.5–5.1)
SODIUM: 131 mmol/L — AB (ref 135–145)
SODIUM: 130 mmol/L — AB (ref 135–145)
Total Bilirubin: 1 mg/dL (ref 0.3–1.2)
Total Bilirubin: 1 mg/dL (ref 0.3–1.2)
Total Protein: 7.2 g/dL (ref 6.5–8.1)
Total Protein: 6.3 g/dL — ABNORMAL LOW (ref 6.5–8.1)

## 2015-12-28 LAB — URINALYSIS, ROUTINE W REFLEX MICROSCOPIC
Bilirubin Urine: NEGATIVE
GLUCOSE, UA: 500 mg/dL — AB
Ketones, ur: NEGATIVE mg/dL
LEUKOCYTES UA: NEGATIVE
NITRITE: NEGATIVE
PH: 7 (ref 5.0–8.0)
Protein, ur: 100 mg/dL — AB
Specific Gravity, Urine: 1.025 (ref 1.005–1.030)

## 2015-12-28 LAB — MRSA PCR SCREENING: MRSA BY PCR: POSITIVE — AB

## 2015-12-28 LAB — CBC
HCT: 39.5 % (ref 36.0–46.0)
HEMOGLOBIN: 13.5 g/dL (ref 12.0–15.0)
MCH: 29.2 pg (ref 26.0–34.0)
MCHC: 34.2 g/dL (ref 30.0–36.0)
MCV: 85.3 fL (ref 78.0–100.0)
PLATELETS: 268 10*3/uL (ref 150–400)
RBC: 4.63 MIL/uL (ref 3.87–5.11)
RDW: 14.8 % (ref 11.5–15.5)
WBC: 19.5 10*3/uL — ABNORMAL HIGH (ref 4.0–10.5)

## 2015-12-28 LAB — PROTIME-INR
INR: 1.14
PROTHROMBIN TIME: 14.6 s (ref 11.4–15.2)

## 2015-12-28 LAB — CBC WITH DIFFERENTIAL/PLATELET
BASOS PCT: 0 %
Basophils Absolute: 0 10*3/uL (ref 0.0–0.1)
EOS PCT: 0 %
Eosinophils Absolute: 0 10*3/uL (ref 0.0–0.7)
HEMATOCRIT: 43.7 % (ref 36.0–46.0)
HEMOGLOBIN: 15.1 g/dL — AB (ref 12.0–15.0)
LYMPHS PCT: 3 %
Lymphs Abs: 0.6 10*3/uL — ABNORMAL LOW (ref 0.7–4.0)
MCH: 29.7 pg (ref 26.0–34.0)
MCHC: 34.6 g/dL (ref 30.0–36.0)
MCV: 86 fL (ref 78.0–100.0)
MONO ABS: 1.3 10*3/uL — AB (ref 0.1–1.0)
MONOS PCT: 7 %
Neutro Abs: 16.8 10*3/uL — ABNORMAL HIGH (ref 1.7–7.7)
Neutrophils Relative %: 90 %
Platelets: 319 10*3/uL (ref 150–400)
RBC: 5.08 MIL/uL (ref 3.87–5.11)
RDW: 14.6 % (ref 11.5–15.5)
WBC: 18.7 10*3/uL — ABNORMAL HIGH (ref 4.0–10.5)

## 2015-12-28 LAB — BLOOD GAS, ARTERIAL
Acid-Base Excess: 4.5 mmol/L — ABNORMAL HIGH (ref 0.0–2.0)
Bicarbonate: 28.5 mmol/L — ABNORMAL HIGH (ref 20.0–28.0)
DRAWN BY: 39899
FIO2: 40
O2 Saturation: 91.9 %
PATIENT TEMPERATURE: 98.6
PCO2 ART: 42.6 mmHg (ref 32.0–48.0)
PEEP: 5 cmH2O
RATE: 22 resp/min
VT: 480 mL
pH, Arterial: 7.441 (ref 7.350–7.450)
pO2, Arterial: 60.9 mmHg — ABNORMAL LOW (ref 83.0–108.0)

## 2015-12-28 LAB — I-STAT CG4 LACTIC ACID, ED
LACTIC ACID, VENOUS: 2.31 mmol/L — AB (ref 0.5–1.9)
LACTIC ACID, VENOUS: 2.02 mmol/L — AB (ref 0.5–1.9)

## 2015-12-28 LAB — CORTISOL: CORTISOL PLASMA: 28.4 ug/dL

## 2015-12-28 LAB — URINE MICROSCOPIC-ADD ON

## 2015-12-28 LAB — STREP PNEUMONIAE URINARY ANTIGEN: STREP PNEUMO URINARY ANTIGEN: POSITIVE — AB

## 2015-12-28 LAB — MAGNESIUM: Magnesium: 1.7 mg/dL (ref 1.7–2.4)

## 2015-12-28 LAB — PROCALCITONIN: PROCALCITONIN: 0.97 ng/mL

## 2015-12-28 LAB — LACTIC ACID, PLASMA
LACTIC ACID, VENOUS: 2.6 mmol/L — AB (ref 0.5–1.9)
LACTIC ACID, VENOUS: 2.2 mmol/L — AB (ref 0.5–1.9)

## 2015-12-28 LAB — INFLUENZA PANEL BY PCR (TYPE A & B)
INFLBPCR: NEGATIVE
Influenza A By PCR: NEGATIVE

## 2015-12-28 LAB — PHOSPHORUS: PHOSPHORUS: 2.7 mg/dL (ref 2.5–4.6)

## 2015-12-28 LAB — APTT: aPTT: 29 seconds (ref 24–36)

## 2015-12-28 MED ORDER — SODIUM CHLORIDE 0.9 % IV SOLN
1000.0000 mL | INTRAVENOUS | Status: DC
  Administered 2015-12-28: 1000 mL via INTRAVENOUS

## 2015-12-28 MED ORDER — PIPERACILLIN-TAZOBACTAM 3.375 G IVPB 30 MIN
3.3750 g | Freq: Once | INTRAVENOUS | Status: AC
  Administered 2015-12-28: 3.375 g via INTRAVENOUS
  Filled 2015-12-28: qty 50

## 2015-12-28 MED ORDER — SODIUM CHLORIDE 0.9 % IV SOLN
30.0000 meq | Freq: Once | INTRAVENOUS | Status: AC
  Administered 2015-12-28: 30 meq via INTRAVENOUS
  Filled 2015-12-28: qty 15

## 2015-12-28 MED ORDER — PROPOFOL 1000 MG/100ML IV EMUL
5.0000 ug/kg/min | Freq: Once | INTRAVENOUS | Status: DC
  Administered 2015-12-28 (×2): 20 ug/kg/min via INTRAVENOUS

## 2015-12-28 MED ORDER — VANCOMYCIN HCL 500 MG IV SOLR
500.0000 mg | Freq: Two times a day (BID) | INTRAVENOUS | Status: DC
  Administered 2015-12-29 – 2015-12-30 (×3): 500 mg via INTRAVENOUS
  Filled 2015-12-28 (×4): qty 500

## 2015-12-28 MED ORDER — MIDAZOLAM HCL 2 MG/2ML IJ SOLN
INTRAMUSCULAR | Status: AC
  Filled 2015-12-28: qty 4

## 2015-12-28 MED ORDER — FENTANYL CITRATE (PF) 100 MCG/2ML IJ SOLN
INTRAMUSCULAR | Status: AC
  Filled 2015-12-28: qty 2

## 2015-12-28 MED ORDER — CHLORHEXIDINE GLUCONATE 0.12% ORAL RINSE (MEDLINE KIT)
15.0000 mL | Freq: Two times a day (BID) | OROMUCOSAL | Status: DC
  Administered 2015-12-28 – 2016-01-04 (×14): 15 mL via OROMUCOSAL

## 2015-12-28 MED ORDER — PANTOPRAZOLE SODIUM 40 MG IV SOLR
40.0000 mg | Freq: Every day | INTRAVENOUS | Status: DC
  Administered 2015-12-29: 40 mg via INTRAVENOUS
  Filled 2015-12-28: qty 40

## 2015-12-28 MED ORDER — SODIUM CHLORIDE 0.9 % IV SOLN
INTRAVENOUS | Status: AC | PRN
  Administered 2015-12-28: 500 mL via INTRAVENOUS

## 2015-12-28 MED ORDER — FENTANYL CITRATE (PF) 100 MCG/2ML IJ SOLN
INTRAMUSCULAR | Status: AC | PRN
  Administered 2015-12-28: 100 ug via INTRAVENOUS

## 2015-12-28 MED ORDER — SODIUM CHLORIDE 0.9 % IV SOLN
INTRAVENOUS | Status: DC
  Administered 2015-12-28: 17:00:00 via INTRAVENOUS

## 2015-12-28 MED ORDER — SODIUM CHLORIDE 0.9 % IV SOLN: INTRAVENOUS | Status: AC

## 2015-12-28 MED ORDER — FENTANYL CITRATE (PF) 100 MCG/2ML IJ SOLN
50.0000 ug | INTRAMUSCULAR | Status: DC | PRN
  Administered 2015-12-29 – 2015-12-30 (×2): 50 ug via INTRAVENOUS
  Administered 2015-12-30: 100 ug via INTRAVENOUS
  Filled 2015-12-28 (×3): qty 2

## 2015-12-28 MED ORDER — ROCURONIUM BROMIDE 50 MG/5ML IV SOLN
INTRAVENOUS | Status: AC | PRN
  Administered 2015-12-28: 50 mg via INTRAVENOUS

## 2015-12-28 MED ORDER — PROPOFOL 1000 MG/100ML IV EMUL
INTRAVENOUS | Status: AC
  Administered 2015-12-28: 20 ug/kg/min via INTRAVENOUS
  Filled 2015-12-28: qty 100

## 2015-12-28 MED ORDER — ORAL CARE MOUTH RINSE
15.0000 mL | Freq: Four times a day (QID) | OROMUCOSAL | Status: DC
  Administered 2015-12-28 – 2016-01-04 (×27): 15 mL via OROMUCOSAL

## 2015-12-28 MED ORDER — FAMOTIDINE IN NACL 20-0.9 MG/50ML-% IV SOLN
20.0000 mg | Freq: Two times a day (BID) | INTRAVENOUS | Status: DC
  Administered 2015-12-28 – 2015-12-29 (×3): 20 mg via INTRAVENOUS
  Filled 2015-12-28 (×3): qty 50

## 2015-12-28 MED ORDER — ACETAMINOPHEN 325 MG PO TABS
650.0000 mg | ORAL_TABLET | Freq: Four times a day (QID) | ORAL | Status: DC | PRN
  Administered 2015-12-28 – 2016-01-02 (×7): 650 mg via ORAL
  Filled 2015-12-28 (×7): qty 2

## 2015-12-28 MED ORDER — SODIUM CHLORIDE 0.9 % IV SOLN
INTRAVENOUS | Status: DC
  Administered 2015-12-28: 10 mL/h via INTRAVENOUS
  Administered 2016-01-04: 04:00:00 via INTRAVENOUS

## 2015-12-28 MED ORDER — ETOMIDATE 2 MG/ML IV SOLN
INTRAVENOUS | Status: AC | PRN
  Administered 2015-12-28: 20 mg via INTRAVENOUS

## 2015-12-28 MED ORDER — VANCOMYCIN HCL IN DEXTROSE 1-5 GM/200ML-% IV SOLN
1000.0000 mg | Freq: Once | INTRAVENOUS | Status: AC
  Administered 2015-12-28: 1000 mg via INTRAVENOUS
  Filled 2015-12-28: qty 200

## 2015-12-28 MED ORDER — PIPERACILLIN-TAZOBACTAM 3.375 G IVPB
3.3750 g | Freq: Three times a day (TID) | INTRAVENOUS | Status: DC
  Administered 2015-12-28 – 2015-12-31 (×8): 3.375 g via INTRAVENOUS
  Filled 2015-12-28 (×12): qty 50

## 2015-12-28 MED ORDER — MIDAZOLAM HCL 2 MG/2ML IJ SOLN
INTRAMUSCULAR | Status: AC | PRN
  Administered 2015-12-28: 2 mg via INTRAVENOUS

## 2015-12-28 MED ORDER — HEPARIN SODIUM (PORCINE) 5000 UNIT/ML IJ SOLN
5000.0000 [IU] | Freq: Three times a day (TID) | INTRAMUSCULAR | Status: DC
  Administered 2015-12-28 – 2016-01-04 (×23): 5000 [IU] via SUBCUTANEOUS
  Filled 2015-12-28 (×23): qty 1

## 2015-12-28 NOTE — H&P (Signed)
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 Rehabilitation in [place] with return to [place]. She has had trach removed twice the last 4 weekd ago by Dr. Lake Bells.  She presents to [facility] ED with increased resp distress, cxr cw multilobar pna. She was intubated by PCCM in ED and a new #6 Shiley trach was placed and she was transferred to ICU on full vent support.  PAST MEDICAL HISTORY :  She  has a past medical history of Arthritis; Complication of anesthesia; Hair loss; History of exercise stress test; Hyperlipidemia; Insomnia; Migraine headache; Movement disorder; Postmenopausal; and Premature atrial contractions.  PAST SURGICAL HISTORY: She  has a past surgical history that includes Hernia repair (2000); Rhinoplasty (1983); Cesarean section (1986); Breast biopsy (01/01/2011); opirectinal membrane peel; and cataracts right eye.  Allergies  Allergen Reactions  . Pollen Extract Other (See Comments)    Running nose  . Tetracyclines & Related     Blisters, photosensitivity    No current facility-administered medications on file prior to encounter.    Current Outpatient Prescriptions on File Prior to Encounter  Medication Sig  . amantadine (SYMMETREL) 50 MG/5ML solution Place 15 mLs (150 mg total) into feeding tube 2 (two) times daily.  [name] amoxicillin (AMOXIL) 400 MG/5ML suspension Take 5 mLs (400 mg total) by mouth every 8 (eight) hours.  [name] atorvastatin (LIPITOR) 20 MG tablet TAKE ONE TABLET EACH DAY  . chlorhexidine (PERIDEX) 0.12 % solution Use as directed 15 mLs in the mouth or throat 2 (two) times daily.  . clonazePAM (KLONOPIN) 0.5 MG tablet Place 1 tablet (0.5 mg total)  into feeding tube as directed. Take 0.25mg  at 0400 and 1400 and 0.5mg  at 2000  . Coenzyme Q10 (COQ-10 PO) Give by tube.  . docusate (COLACE) 50 MG/5ML liquid Take 10 mLs (100 mg total) by mouth daily.  . fluticasone (FLONASE) 50 MCG/ACT nasal spray Place 2 sprays into both nostrils daily.  . Lactobacillus Rhamnosus, GG, (PROBIOTIC COLIC) LIQD Take by mouth every morning.   . loratadine (CLARITIN) 10 MG tablet Place 10 mg into feeding tube daily.  [name] Nystatin POWD Apply topically to affected area  . traZODone (DESYREL) 50 MG tablet Place 1-2 tablets (50-100 mg total) into feeding tube at bedtime.  . valACYclovir (VALTREX) 500 MG tablet Place 1 tablet (500 mg total) into feeding tube daily.  . fluconazole (DIFLUCAN) 100 MG tablet Take 1 tablet (100 mg total) by mouth daily. (Patient not taking: Reported on 12/28/2015)    FAMILY HISTORY:  Her indicated that her mother is deceased. She indicated that her father is alive. She indicated that both of her brothers are alive. She indicated that the status of her maternal grandfather is unknown. She indicated that both of her daughters are alive. She indicated that her son is alive. She indicated that the status of her maternal uncle is unknown.    SOCIAL HISTORY: She  reports that she has never smoked. She has never used smokeless tobacco. She reports that she drinks alcohol. She reports that she does not use drugs.  REVIEW OF SYSTEMS:   NA  SUBJECTIVE:  Acutely ill ffemale  VITAL SIGNS: BP 144/83   Pulse 95  Temp 100.9 F (38.3 C) (Rectal)   Resp 22   Ht 5\' 6"  (1.676 m)   LMP 12/01/2010   SpO2 100%   HEMODYNAMICS:    VENTILATOR SETTINGS: Vent Mode: PRVC FiO2 (%):  [50 %] 50 % Set Rate:  [22 bmp] 22 bmp Vt Set:  [480 mL] 480 mL PEEP:  [5 cmH20] 5 cmH20  INTAKE / OUTPUT: No intake/output data recorded.  PHYSICAL EXAMINATION: General:  WNWF with expressive aphasia and in resp distress Neuro:  Left arm contracture, expressive  aphasia, decreased LOC, new lt hip contracture.  HEENT:  Oral mucosa dry, copious oral crusty secretions Cardiovascular:  HSR RRR146/76, st 103 Lungs: coarse rhonchi with decreased bs Abdomen: Peg in place Musculoskeletal:  New left hip contracture , check x ray Skin:  Hot and dry  LABS:  BMET  Recent Labs Lab 12/28/15 1115  NA 131*  K 3.5  CL 87*  CO2 31  BUN 11  CREATININE 0.37*  GLUCOSE 206*    Electrolytes  Recent Labs Lab 12/28/15 1115  CALCIUM 9.3    CBC  Recent Labs Lab 12/28/15 1115  WBC 18.7*  HGB 15.1*  HCT 43.7  PLT 319    Coag's No results for input(s): APTT, INR in the last 168 hours.  Sepsis Markers  Recent Labs Lab 12/28/15 1127  LATICACIDVEN 2.31*    ABG  Recent Labs Lab 12/28/15 1159  PHART 7.353  PCO2ART 51.3*  PO2ART 91.0    Liver Enzymes  Recent Labs Lab 12/28/15 1115  AST 69*  ALT 129*  ALKPHOS 131*  BILITOT 1.0  ALBUMIN 2.8*    Cardiac Enzymes No results for input(s): TROPONINI, PROBNP in the last 168 hours.  Glucose No results for input(s): GLUCAP in the last 168 hours.  Imaging Dg Chest Portable 1 View  Result Date: 12/28/2015 CLINICAL DATA:  65 year old female with history of tracheostomy tube placement. EXAM: PORTABLE CHEST 1 VIEW COMPARISON:  Chest x-ray 12/28/2015. FINDINGS: New tracheostomy tube in position with tip projecting in the midline approximately 4.6 cm above the carina. Again noted is patchy multifocal airspace opacities in the lungs bilaterally (right greater than left), most confluent throughout the right mid to upper lung and the medial aspect of the left lung base, similar to the prior study. No pleural effusions. No evidence of pulmonary edema. Heart size is normal. The patient is rotated to the left on today's exam, resulting in distortion of the mediastinal contours and reduced diagnostic sensitivity and specificity for mediastinal pathology. IMPRESSION: 1. New tracheostomy tube appears  appropriately located. 2. Persistent asymmetrically distributed multifocal airspace consolidation concerning for multilobar bronchopneumonia. Electronically Signed   By: Vinnie Langton M.D.   On: 12/28/2015 14:33   Dg Chest Port 1 View  Result Date: 12/28/2015 CLINICAL DATA:  Fever and shortness of breath. Altered mental status. EXAM: PORTABLE CHEST 1 VIEW COMPARISON:  Chest CT dated 02/08/2014. FINDINGS: Normal sized heart. Patchy airspace opacity in both lungs, greater on the right. No pleural fluid. Diffuse osteopenia. Patient rotation to the left with positional scoliosis. IMPRESSION: Patchy bilateral pneumonia, greater on the right. Electronically Signed   By: Claudie Revering M.D.   On: 12/28/2015 12:01     STUDIES:    CULTURES: 12/3 bc x 2>> 12/3 BAL>> 12/3 UC>>  ANTIBIOTICS: 12/3 vanc>> 12/3 Zoysn>>  SIGNIFICANT EVENTS: 12/3 admit to ICU  LINES/TUBES: 12/3 ett>>12/3 12/3 #6 Shiley(JY)>>  DISCUSSION: 65 yo female with a PMH of Viral Encephalopathy dx 11/2014 which resulted in  left arm contracture, expressive aphasia an tracheostomy placement. Rehabilitation in [place] with return to [place]. She has had trach removed twice the last 4 weekd ago by Dr. Lake Bells.  She presents to [facility] ED with increased resp distress, cxr cw multilobar pna. She was intubated by PCCM in ED and a new #6 Shiley trach was placed and she was transferred to ICU on full vent support.  ASSESSMENT / PLAN:  PULMONARY A: Acute resp decompensation with multilobar pna with hx of trach and open stoma. Unable to tolerate NIMVS. P:   Intubate and re trach 12/3 done with # 6 trach. MVS BAL RML performed during trach V/Z abx  CARDIOVASCULAR A:  HD stable on admit P:  Follow in ICU  RENAL A:   No acute issue P:   Follow labs  GASTROINTESTINAL A:   GI protection P:   PPI  HEMATOLOGIC A:   No acute issue P:  Follow labs  INFECTIOUS A:   Multilobar Pna P:   V/Z per  pharmacy Check pro calcitonin   ENDOCRINE A:   Hyperglycemia  P:   SSI  NEUROLOGIC A:   Hx of Viral Encephalopathy 11/2014 with contracture of left arm and recently left leg. Expressive aphasia.  Worsening mental status 12/3 requiring intubation P:   RASS goal: 0-1 Mental status checks in ICU    FAMILY  - Updates:   - Inter-disciplinary family meet or Palliative Care meeting due by:  day 7  CCT: 45 min  Richardson Landry Minor ACNP Maryanna Shape PCCM Pager [phone] till 3 pm If no answer page [phone] 12/28/2015, 3:10 PM  Attending Note:  65 year old female with history of viral encephalitis that left her severely debilitated with a tracheostomy placed in [place] that was decannulated 5 wks prior to presentation who now presents with multi-lobar pneumonia in ARDS and hypoxemic respiratory failure and AMS.  On exam, she is unresponsive and on BiPAP with a significant leak from the open stoma.  I reviewed CXR myself, multi-lobar infiltrate noted.  I spoke with the patient's husband, full code status.  Patient was transferred to the trauma bay and was sedated and intubated then retrached.  Pan culture.  Vanc/zosyn given.  Full vent support for now.  Bronch with BAL done and will f/u on culture.  The patient is critically ill with multiple organ systems failure and requires high complexity decision making for assessment and support, frequent evaluation and titration of therapies, application of advanced monitoring technologies and extensive interpretation of multiple databases.   Critical Care Time devoted to patient care services described in this note is  35  Minutes. This time reflects time of care of this signee Dr Jennet Maduro. This critical care time does not reflect procedure time, or teaching time or supervisory time of PA/NP/Med student/Med Resident etc but could involve care discussion time.  Rush Farmer, M.D. [facility] Pulmonary/Critical Care Medicine. Pager: [phone]. After hours  pager: [phone].

## 2015-12-28 NOTE — Telephone Encounter (Signed)
Needs OV and CXR this week

## 2015-12-28 NOTE — ED Triage Notes (Signed)
Per EMS- patient here from home. Pt has history of encephalitis. Pt has had increasing decline in mentation for one week. Pt febrile. Lung sounds diminished. Lurline Idol was taken out 1 month ago. Last given tylenol around 0400.

## 2015-12-28 NOTE — ED Notes (Signed)
Per MD no fluid bolus's at this point.

## 2015-12-28 NOTE — Procedures (Signed)
Emergent Tracheostomy Placement   Percutaneous Tracheostomy Placement  Consent from family.  Patient sedated, paralyzed and position.  Placed on 100% FiO2 and RR matched.  Area cleaned and draped.  Lidocaine/epi injected.  Skin incision done along the previous trach site followed by blunt dissection.  Trachea palpated then punctured, catheter passed and visualized bronchoscopically.  Wire placed and visualized.  Catheter removed.  Airway then crushed and dilated.  Size 6 cuffed shiley trach placed and visualized bronchoscopically well above carina.  Good volume returns.  Patient tolerated the procedure well without complications.  Minimal blood loss.  CXR ordered and pending.  Rush Farmer, M.D. [facility] Pulmonary/Critical Care Medicine. Pager: [phone]. After hours pager: [phone].

## 2015-12-28 NOTE — Progress Notes (Signed)
[facility] Progress Note Patient Name: Judy Spears DOB: 29-May-1950 MRN: [id number]   Date of Service  12/28/2015  HPI/Events of Note  Low potassium  eICU Interventions  replaced     Intervention Category Minor Interventions: Electrolytes abnormality - evaluation and management  Mauri [place], P 12/28/2015, 6:53 PM

## 2015-12-28 NOTE — Progress Notes (Signed)
Patient transported from ED to room 123XX123 without complications.

## 2015-12-28 NOTE — Progress Notes (Signed)
CRITICAL VALUE ALERT  Critical value received:  Lactic Acid 2.6  Date of notification:  12/3  Time of notification:  1730  Critical value read back:Yes.    Nurse who received alert:  Whyatt Klinger  Responding MD:  Dr.Smith E-Link  Time MD responded:  [phone]

## 2015-12-28 NOTE — Procedures (Signed)
Bronchoscopy  for Percutaneous  Tracheostomy  Name: Judy Spears MRN: [id number] DOB: 06-27-1950 Procedure: Bronchoscopy for Percutaneous Tracheostomy Indications: Diagnostic evaluation of the airways In conjunction with: Dr. Nelda Marseille  Procedure Details Consent: Risks of procedure as well as the alternatives and risks of each were explained to the (patient/caregiver).  Consent for procedure obtained. Time Out: Verified patient identification, verified procedure, site/side was marked, verified correct patient position, special equipment/implants available, medications/allergies/relevent history reviewed, required imaging and test results available.  Performed  In preparation for procedure, patient was given 100% FiO2 and bronchoscope lubricated. Sedation: Benzodiazepines, Muscle relaxants and Etomidate  Airway entered and the following bronchi were examined: RML.   Procedures performed: Endotracheal Tube retracted in 2 cm increments. Cannulation of airway observed. Dilation observed. Placement of trachel tube  observed . No overt complications. Bronchoscope removed.    Evaluation Hemodynamic Status: BP stable throughout; O2 sats: stable throughout Patient's Current Condition: stable Specimens:  Sent serosanguinous fluid Complications: No apparent complications Patient did tolerate procedure well.   Richardson Landry Minor ACNP Maryanna Shape PCCM Pager [phone] till 3 pm If no answer page [phone] 12/28/2015, 2:05 PM  Rush Farmer, M.D. [facility] Pulmonary/Critical Care Medicine. Pager: [phone]. After hours pager: [phone].

## 2015-12-28 NOTE — Procedures (Signed)
Intubation Procedure Note Judy Spears [id number] [birth year]  Procedure: Intubation Indications: Prior to bronchoscopy  Procedure Details Consent: Risks of procedure as well as the alternatives and risks of each were explained to the (patient/caregiver).  Consent for procedure obtained. Time Out: Verified patient identification, verified procedure, site/side was marked, verified correct patient position, special equipment/implants available, medications/allergies/relevent history reviewed, required imaging and test results available.  Performed  MAC and 3 Medications:  Fentanyl 100 mcg Etomidate 20 mg Versed 2 mg NMB ROCURONIUM 50 MG   Evaluation Hemodynamic Status: Transient hypotension treated with fluid; O2 sats: stable throughout Patient's Current Condition: stable Complications: No apparent complications Patient did tolerate procedure well. Chest X-ray ordered to verify placement.  CXR: pending.   Richardson Landry Minor ACNP Maryanna Shape PCCM Pager [phone] till 3 pm If no answer page [phone] 12/28/2015, 2:02 PM  Rush Farmer, M.D. [facility] Pulmonary/Critical Care Medicine. Pager: [phone]. After hours pager: [phone].

## 2015-12-28 NOTE — Progress Notes (Signed)
Lab called stated unable to located specimen obtained from bronch performed by Dr. Nelda Marseille to run necessary testing. Dr. Tamala Julian called and made aware.

## 2015-12-28 NOTE — Progress Notes (Signed)
RT note: RT taped and secured old trach stoma to place patient on BIPAP.

## 2015-12-28 NOTE — Progress Notes (Signed)
Pharmacy Antibiotic Note  Judy Spears is a 65 y.o. female admitted on 12/28/2015 with declining mentation Pharmacy has been consulted for vancomycin and Zosyn dosing for sepsis.  First doses of antibiotics are already given.  Baseline labs reviewed.  Plan: - Vanc 500 IV Q12H  - Zosyn 3.375gm IV Q8H, 4 hr infusion - Monitor renal fxn, clinical progress, vanc trough prior to 4th dose - F/U updated weight     Temp (24hrs), Avg:100.9 F (38.3 C), Min:100.9 F (38.3 C), Max:100.9 F (38.3 C)   Recent Labs Lab 12/28/15 1115 12/28/15 1127  WBC 18.7*  --   CREATININE 0.37*  --   LATICACIDVEN  --  2.31*    CrCl cannot be calculated (Unknown ideal weight.).    Allergies  Allergen Reactions  . Pollen Extract Other (See Comments)    Running nose  . Tetracyclines & Related     Blisters, photosensitivity    Antimicrobials this admission:  Vanc 12/3 >> Zosyn 12/3 >>  Dose adjustments this admission:  N/A  Microbiology results:  12/3 UCx -  12/3 BCx x2 -   Remigio Eisenmenger D. Mina Marble, PharmD, BCPS Pager:  [phone] 12/28/2015, 12:42 PM

## 2015-12-28 NOTE — Progress Notes (Signed)
CRITICAL VALUE ALERT  Critical value received:  Lactic acid= 2.2, (+) MRSA swab  Date of notification:  t  Time of notification:  2000  Critical value read back:Yes.    Nurse who received alert:  Arloa Koh, RN  MD notified (1st page):  E-link MD  Time of first page:  2005  MD notified (2nd page):  Time of second page:  Responding MD:  Dr Tamala Julian  Time MD responded:  2005

## 2015-12-28 NOTE — Procedures (Signed)
Bronchoscopy Procedure Note Judy Spears [id number] Feb 07, 1950  Procedure: Bronchoscopy Indications: Obtain specimens for culture and/or other diagnostic studies  Procedure Details Consent: Risks of procedure as well as the alternatives and risks of each were explained to the (patient/caregiver).  Consent for procedure obtained. Time Out: Verified patient identification, verified procedure, site/side was marked, verified correct patient position, special equipment/implants available, medications/allergies/relevent history reviewed, required imaging and test results available.  Performed  In preparation for procedure, patient was given 100% FiO2 and bronchoscope lubricated. Sedation: Benzodiazepines, Muscle relaxants and Etomidate  Airway entered and the following bronchi were examined: RUL, RML, RLL, LUL, LLL and Bronchi.   BAL from RML Bronchoscope removed.  , Patient placed back on 100% FiO2 at conclusion of procedure.    Evaluation Hemodynamic Status: BP stable throughout; O2 sats: stable throughout Patient's Current Condition: stable Specimens:  Sent serosanguinous fluid Complications: No apparent complications Patient did tolerate procedure well.   YACOUB,WESAM 12/28/2015

## 2015-12-28 NOTE — ED Provider Notes (Signed)
[facility] DEPT Provider Note   CSN: [id number] Arrival date & time: 12/28/15  1104     History   Chief Complaint Chief Complaint  Patient presents with  . Altered Mental Status  . Code Sepsis    HPI Judy Spears is a 65 y.o. female.  Patient with a history of viral encephalopathy. This occurred in November 2016. Patient had a long stay on ventilator trach tube for long period of time. Patient was at rehabilitation center in [place] for long period of time. Now back home in [place]. Lurline Idol was removed October 18. Patient followed entirely by pulmonary critical care here. Several family members came down with upper respiratory infection over the Thanksgiving holiday. Patient started to show signs of an upper respiratory infection over the weekend. Things get significantly worse last few days. Patient last seen by pulmonologist of November 29. At that time patient started on amoxicillin. Patient was not toxic in any acute distress on that visit. Patient's had a long-standing altered mental status usually answers yes or no to some questions. But patient has been unresponsive for the past few days. Increased respiratory rate. Fevers intermittently. No nausea vomiting or diarrhea. Patient is fed by tube feedings. Patient brought in by EMS. Other symptoms included cough with some mucus production.       Past Medical History:  Diagnosis Date  . Arthritis    lt great toe  . Complication of anesthesia    pt has had headaches post op-did advise to hydra well preop  . Hair loss    right-sided  . History of exercise stress test    a. ETT (12/15) with 12:00 exercise, no ST changes, occasional PACs.   . Hyperlipidemia   . Insomnia   . Migraine headache   . 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 Postmenopausal   . Premature atrial contractions    a. Holter (12/15) with 8% PACs, no atrial runs or atrial fibrillation.     Patient Active Problem List   Diagnosis  Date Noted  . Acute tracheobronchitis 12/24/2015  . Tracheostomy tube present ([place])   . Tracheal stenosis   . Chronic respiratory failure with hypoxia ([place])   . 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 Allergic rhinitis 09/26/2015  . 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 Movement disorder 09/22/2015  . Encephalitis 09/22/2015  . Chest pain 02/07/2014  . Premature atrial contractions 01/13/2014  . Abnormality of gait 12/04/2013  . Hyperlipidemia 12/21/2011  . Cardiovascular risk factor 12/21/2011  . Bunion 01/31/2008  . Metatarsalgia of both feet 09/20/2007  . FLAT FOOT 09/20/2007  . HALLUX RIGIDUS, ACQUIRED 09/20/2007    Past Surgical History:  Procedure Laterality Date  . BREAST BIOPSY  01/01/2011   Procedure: BREAST BIOPSY WITH NEEDLE LOCALIZATION;  Surgeon: Rolm Bookbinder, MD;  Location: [facility];  Service: General;  Laterality: Left;  left breast wire localization biopsy  . cataracts right eye    . CESAREAN SECTION  1986  . HERNIA REPAIR  2000   RI[facility]  . opirectinal membrane peel    . RHINOPLASTY  1983    OB History    No data available       Home Medications    Prior to Admission medications   Medication Sig Start Date End Date Taking? Authorizing Provider  amantadine (SYMMETREL) 50 MG/5ML solution Place 15 mLs (150 mg total) into feeding tube 2 (two) times daily. 10/10/15  Yes  Meredith Staggers, MD  amoxicillin (AMOXIL) 400 MG/5ML suspension Take 5 mLs (400 mg total) by mouth every 8 (eight) hours. 12/24/15  Yes Juanito Doom, MD  atorvastatin (LIPITOR) 20 MG tablet TAKE ONE TABLET EACH DAY 10/20/15  Yes Larey Dresser, MD  chlorhexidine (PERIDEX) 0.12 % solution Use as directed 15 mLs in the mouth or throat 2 (two) times daily. 10/08/15  Yes Meredith Staggers, MD  clonazePAM (KLONOPIN) 0.5 MG tablet Place 1 tablet (0.5 mg total) into feeding tube as directed. Take 0.25mg  at 0400 and 1400 and 0.5mg  at 2000  10/20/15  Yes Meredith Staggers, MD  Coenzyme Q10 (COQ-10 PO) Give by tube.   Yes Historical Provider, MD  docusate (COLACE) 50 MG/5ML liquid Take 10 mLs (100 mg total) by mouth daily. 10/10/15  Yes Meredith Staggers, MD  fluticasone (FLONASE) 50 MCG/ACT nasal spray Place 2 sprays into both nostrils daily. 10/10/15  Yes Meredith Staggers, MD  Lactobacillus Rhamnosus, GG, (PROBIOTIC COLIC) LIQD Take by mouth every morning.    Yes Historical Provider, MD  loratadine (CLARITIN) 10 MG tablet Place 10 mg into feeding tube daily.   Yes Historical Provider, MD  Nystatin POWD Apply topically to affected area 12/02/15  Yes Meredith Staggers, MD  PRESCRIPTION MEDICATION Place 1 application into both nostrils 2 (two) times a week. Eye ointment   Yes Historical Provider, MD  traZODone (DESYREL) 50 MG tablet Place 1-2 tablets (50-100 mg total) into feeding tube at bedtime. 10/10/15  Yes Meredith Staggers, MD  valACYclovir (VALTREX) 500 MG tablet Place 1 tablet (500 mg total) into feeding tube daily. 10/10/15  Yes Meredith Staggers, MD  fluconazole (DIFLUCAN) 100 MG tablet Take 1 tablet (100 mg total) by mouth daily. Patient not taking: Reported on 12/28/2015 12/26/15   Tanda Rockers, MD    Family History Family History  Problem Relation Age of Onset  . Stroke Mother     paralyzed on right side/aphasia  . Hypertension Mother     had stroke after stopping BP meds  . Colon cancer Father   . Prostate cancer Father   . Heart attack Maternal Grandfather 56  . Stroke Maternal Uncle 60    Social History Social History  Substance Use Topics  . Smoking status: Never Smoker  . Smokeless tobacco: Never Used  . Alcohol use 0.0 oz/week     Comment: Occasional.     Allergies   Pollen extract and Tetracyclines & related   Review of Systems Review of Systems  Unable to perform ROS: Mental status change     Physical Exam Updated Vital Signs BP 119/70   Pulse 98   Temp 100.9 F (38.3 C) (Rectal)   Resp 23    LMP 12/01/2010   SpO2 98%   Physical Exam  Constitutional: She appears well-nourished. She appears distressed.  HENT:  Mucous membranes dry.  Eyes: Conjunctivae are normal.  Neck: Neck supple.  Bandage over a trach site.  Cardiovascular:  Tachycardic.  Pulmonary/Chest: She is in respiratory distress. She has no wheezes.  Rapid respiratory rate.  Abdominal: There is no tenderness.  Feeding tube site.  Musculoskeletal: She exhibits no edema.  Neurological:  Nonresponsive.  Skin: Skin is warm.  Nursing note and vitals reviewed.    ED Treatments / Results  Labs (all labs ordered are listed, but only abnormal results are displayed) Labs Reviewed  COMPREHENSIVE METABOLIC PANEL - Abnormal; Notable for the following:  Result Value   Sodium 131 (*)    Chloride 87 (*)    Glucose, Bld 206 (*)    Creatinine, Ser 0.37 (*)    Albumin 2.8 (*)    AST 69 (*)    ALT 129 (*)    Alkaline Phosphatase 131 (*)    All other components within normal limits  CBC WITH DIFFERENTIAL/PLATELET - Abnormal; Notable for the following:    WBC 18.7 (*)    Hemoglobin 15.1 (*)    Neutro Abs 16.8 (*)    Lymphs Abs 0.6 (*)    Monocytes Absolute 1.3 (*)    All other components within normal limits  URINALYSIS, ROUTINE W REFLEX MICROSCOPIC (NOT AT [facility]) - Abnormal; Notable for the following:    Glucose, UA 500 (*)    Hgb urine dipstick SMALL (*)    Protein, ur 100 (*)    All other components within normal limits  URINE MICROSCOPIC-ADD ON - Abnormal; Notable for the following:    Squamous Epithelial / LPF 0-5 (*)    Bacteria, UA RARE (*)    All other components within normal limits  I-STAT ARTERIAL BLOOD GAS, ED - Abnormal; Notable for the following:    pCO2 arterial 51.3 (*)    Bicarbonate 28.1 (*)    All other components within normal limits  I-STAT CG4 LACTIC ACID, ED - Abnormal; Notable for the following:    Lactic Acid, Venous 2.31 (*)    All other components within normal limits    CULTURE, BLOOD (ROUTINE X 2)  CULTURE, BLOOD (ROUTINE X 2)  URINE CULTURE  INFLUENZA PANEL BY PCR (TYPE A & B, H1N1)   Results for orders placed or performed during the hospital encounter of 12/28/15  Comprehensive metabolic panel  Result Value Ref Range   Sodium 131 (L) 135 - 145 mmol/L   Potassium 3.5 3.5 - 5.1 mmol/L   Chloride 87 (L) 101 - 111 mmol/L   CO2 31 22 - 32 mmol/L   Glucose, Bld 206 (H) 65 - 99 mg/dL   BUN 11 6 - 20 mg/dL   Creatinine, Ser 0.37 (L) 0.44 - 1.00 mg/dL   Calcium 9.3 8.9 - 10.3 mg/dL   Total Protein 7.2 6.5 - 8.1 g/dL   Albumin 2.8 (L) 3.5 - 5.0 g/dL   AST 69 (H) 15 - 41 U/L   ALT 129 (H) 14 - 54 U/L   Alkaline Phosphatase 131 (H) 38 - 126 U/L   Total Bilirubin 1.0 0.3 - 1.2 mg/dL   GFR calc non Af Amer >60 >60 mL/min   GFR calc Af Amer >60 >60 mL/min   Anion gap 13 5 - 15  CBC WITH DIFFERENTIAL  Result Value Ref Range   WBC 18.7 (H) 4.0 - 10.5 K/uL   RBC 5.08 3.87 - 5.11 MIL/uL   Hemoglobin 15.1 (H) 12.0 - 15.0 g/dL   HCT 43.7 36.0 - 46.0 %   MCV 86.0 78.0 - 100.0 fL   MCH 29.7 26.0 - 34.0 pg   MCHC 34.6 30.0 - 36.0 g/dL   RDW 14.6 11.5 - 15.5 %   Platelets 319 150 - 400 K/uL   Neutrophils Relative % 90 %   Lymphocytes Relative 3 %   Monocytes Relative 7 %   Eosinophils Relative 0 %   Basophils Relative 0 %   Neutro Abs 16.8 (H) 1.7 - 7.7 K/uL   Lymphs Abs 0.6 (L) 0.7 - 4.0 K/uL   Monocytes Absolute 1.3 (H) 0.1 -  1.0 K/uL   Eosinophils Absolute 0.0 0.0 - 0.7 K/uL   Basophils Absolute 0.0 0.0 - 0.1 K/uL   WBC Morphology MILD LEFT SHIFT (1-5% METAS, OCC MYELO, OCC BANDS)    Smear Review LARGE PLATELETS PRESENT   Urinalysis, Routine w reflex microscopic (not at [facility])  Result Value Ref Range   Color, Urine YELLOW YELLOW   APPearance CLEAR CLEAR   Specific Gravity, Urine 1.025 1.005 - 1.030   pH 7.0 5.0 - 8.0   Glucose, UA 500 (A) NEGATIVE mg/dL   Hgb urine dipstick SMALL (A) NEGATIVE   Bilirubin Urine NEGATIVE NEGATIVE   Ketones, ur  NEGATIVE NEGATIVE mg/dL   Protein, ur 100 (A) NEGATIVE mg/dL   Nitrite NEGATIVE NEGATIVE   Leukocytes, UA NEGATIVE NEGATIVE  Urine microscopic-add on  Result Value Ref Range   Squamous Epithelial / LPF 0-5 (A) NONE SEEN   WBC, UA 0-5 0 - 5 WBC/hpf   RBC / HPF 6-30 0 - 5 RBC/hpf   Bacteria, UA RARE (A) NONE SEEN  I-Stat Arterial Blood Gas, ED - (order at [facility] and [facility] only)  Result Value Ref Range   pH, Arterial 7.353 7.350 - 7.450   pCO2 arterial 51.3 (H) 32.0 - 48.0 mmHg   pO2, Arterial 91.0 83.0 - 108.0 mmHg   Bicarbonate 28.1 (H) 20.0 - 28.0 mmol/L   TCO2 30 0 - 100 mmol/L   O2 Saturation 96.0 %   Acid-Base Excess 2.0 0.0 - 2.0 mmol/L   Patient temperature 100.9 F    Collection site BRACHIAL ARTERY    Drawn by Operator    Sample type ARTERIAL   I-Stat CG4 Lactic Acid, ED  (not at  [facility])  Result Value Ref Range   Lactic Acid, Venous 2.31 (HH) 0.5 - 1.9 mmol/L   Comment NOTIFIED PHYSICIAN      EKG  EKG Interpretation  Date/Time:  Sunday December 28 2015 11:39:49 EST Ventricular Rate:  100 PR Interval:    QRS Duration: 84 QT Interval:  345 QTC Calculation: 445 R Axis:   38 Text Interpretation:  Sinus tachycardia Biatrial enlargement Probable left ventricular hypertrophy Confirmed by Rogene Houston  MD, Lameeka Schleifer [phone]) on 12/28/2015 11:46:07 AM       Radiology Dg Chest Port 1 View  Result Date: 12/28/2015 CLINICAL DATA:  Fever and shortness of breath. Altered mental status. EXAM: PORTABLE CHEST 1 VIEW COMPARISON:  Chest CT dated 02/08/2014. FINDINGS: Normal sized heart. Patchy airspace opacity in both lungs, greater on the right. No pleural fluid. Diffuse osteopenia. Patient rotation to the left with positional scoliosis. IMPRESSION: Patchy bilateral pneumonia, greater on the right. Electronically Signed   By: Claudie Revering M.D.   On: 12/28/2015 12:01    Procedures Procedures (including critical care time)   CRITICAL CARE Performed by: Fredia Sorrow Total critical care  time: 45 minutes Critical care time was exclusive of separately billable procedures and treating other patients. Critical care was necessary to treat or prevent imminent or life-threatening deterioration. Critical care was time spent personally by me on the following activities: development of treatment plan with patient and/or surrogate as well as nursing, discussions with consultants, evaluation of patient's response to treatment, examination of patient, obtaining history from patient or surrogate, ordering and performing treatments and interventions, ordering and review of laboratory studies, ordering and review of radiographic studies, pulse oximetry and re-evaluation of patient's condition.    Medications Ordered in ED Medications  0.9 %  sodium chloride infusion (1,000 mLs Intravenous New Bag/Given 12/28/15  1140)  vancomycin (VANCOCIN) 500 mg in sodium chloride 0.9 % 100 mL IVPB (not administered)  piperacillin-tazobactam (ZOSYN) IVPB 3.375 g (not administered)  fentaNYL (SUBLIMAZE) 100 MCG/2ML injection (not administered)  midazolam (VERSED) injection ( Intravenous Canceled Entry 12/28/15 1345)  fentaNYL (SUBLIMAZE) injection ( Intravenous Canceled Entry 12/28/15 1345)  etomidate (AMIDATE) injection (20 mg Intravenous Given 12/28/15 1344)  rocuronium (ZEMURON) injection (50 mg Intravenous Given 12/28/15 1344)  piperacillin-tazobactam (ZOSYN) IVPB 3.375 g (0 g Intravenous Stopped 12/28/15 1203)  vancomycin (VANCOCIN) IVPB 1000 mg/200 mL premix (0 mg Intravenous Stopped 12/28/15 1230)  propofol (DIPRIVAN) 1000 MG/100ML infusion (20 mcg/kg/min Intravenous New Bag/Given 12/28/15 1350)     Initial Impression / Assessment and Plan / ED Course  I have reviewed the triage vital signs and the nursing notes.  Pertinent labs & imaging results that were available during my care of the patient were reviewed by me and considered in my medical decision making (see chart for details).  Clinical Course     Patient consult to 4 admission to critical care service. They have come down and seen her. Patient initially treated with BiPAP. However figured ultimately the patient was in a require intubation. Although initial blood gas was reasonable. Patient continued to be tachypnea. Chest x-ray consistent with pneumonia. Based on her past medical history treating as if it's a healthcare acquired pneumonia. Patient initially treated as for sepsis. Lactic acid was less than 4 initially. Never hypotensive.  Medical care decided that they would not be of use the trach site again. Her trach was removed October 18. They're going to intubate her orally and then reestablish trach at a later date.   Patient started on broad-spectrum antibiotics as per sepsis protocol. Patient not given fluid bolus because lactic acid was not elevated and she was not hypotensive.   Patient did respond well to the BiPAP. There was no trouble with any air leakage from the trach site.  Influenza testing ordered. Patient with significant altered mental status even at baseline due to having viral encephalopathy.    Final Clinical Impressions) / ED Diagnoses   Final diagnoses:  Hypoxia  Sepsis, due to unspecified organism ([place])  HCAP (healthcare-associated pneumonia)    New Prescriptions New Prescriptions   No medications on file     Fredia Sorrow, MD 12/28/15 1401

## 2015-12-28 NOTE — Progress Notes (Signed)
RT note: RT assisted MD with bedside intubation with 7.5 et  Tube prior trach insertion. Postive ETCO2 and easy cap. MD placed a #6 shiley at bedside with positive verification with bronchoscope and direct. Vital sign stable at this time.

## 2015-12-29 ENCOUNTER — Encounter (HOSPITAL_COMMUNITY): Payer: Self-pay | Admitting: *Deleted

## 2015-12-29 DIAGNOSIS — A419 Sepsis, unspecified organism: Secondary | ICD-10-CM

## 2015-12-29 DIAGNOSIS — R0902 Hypoxemia: Secondary | ICD-10-CM

## 2015-12-29 DIAGNOSIS — R52 Pain, unspecified: Secondary | ICD-10-CM

## 2015-12-29 LAB — CBC
HEMATOCRIT: 34.6 % — AB (ref 36.0–46.0)
HEMOGLOBIN: 11.5 g/dL — AB (ref 12.0–15.0)
MCH: 28.6 pg (ref 26.0–34.0)
MCHC: 33.2 g/dL (ref 30.0–36.0)
MCV: 86.1 fL (ref 78.0–100.0)
Platelets: 281 10*3/uL (ref 150–400)
RBC: 4.02 MIL/uL (ref 3.87–5.11)
RDW: 15 % (ref 11.5–15.5)
WBC: 18.5 10*3/uL — ABNORMAL HIGH (ref 4.0–10.5)

## 2015-12-29 LAB — GLUCOSE, CAPILLARY
GLUCOSE-CAPILLARY: 107 mg/dL — AB (ref 65–99)
GLUCOSE-CAPILLARY: 117 mg/dL — AB (ref 65–99)
Glucose-Capillary: 120 mg/dL — ABNORMAL HIGH (ref 65–99)
Glucose-Capillary: 116 mg/dL — ABNORMAL HIGH (ref 65–99)

## 2015-12-29 LAB — BASIC METABOLIC PANEL
ANION GAP: 9 (ref 5–15)
BUN: 13 mg/dL (ref 6–20)
CHLORIDE: 97 mmol/L — AB (ref 101–111)
CO2: 25 mmol/L (ref 22–32)
Calcium: 8.4 mg/dL — ABNORMAL LOW (ref 8.9–10.3)
Creatinine, Ser: 0.42 mg/dL — ABNORMAL LOW (ref 0.44–1.00)
GFR calc non Af Amer: >60 mL/min (ref >60)
Glucose, Bld: 96 mg/dL (ref 65–99)
POTASSIUM: 3.4 mmol/L — AB (ref 3.5–5.1)
SODIUM: 131 mmol/L — AB (ref 135–145)

## 2015-12-29 LAB — BLOOD GAS, ARTERIAL
ACID-BASE EXCESS: 4.2 mmol/L — AB (ref 0.0–2.0)
BICARBONATE: 27.3 mmol/L (ref 20.0–28.0)
Drawn by: 36277
FIO2: 40
LHR: 22 {breaths}/min
O2 SAT: 96.3 %
PATIENT TEMPERATURE: 98.6
PCO2 ART: 34.7 mmHg (ref 32.0–48.0)
PEEP/CPAP: 5 cmH2O
PO2 ART: 76.2 mmHg — AB (ref 83.0–108.0)
VT: 480 mL
pH, Arterial: 7.507 — ABNORMAL HIGH (ref 7.350–7.450)

## 2015-12-29 LAB — URINE CULTURE: CULTURE: NO GROWTH

## 2015-12-29 LAB — PHOSPHORUS
PHOSPHORUS: 3.4 mg/dL (ref 2.5–4.6)
PHOSPHORUS: 3.5 mg/dL (ref 2.5–4.6)

## 2015-12-29 LAB — MAGNESIUM
MAGNESIUM: 1.7 mg/dL (ref 1.7–2.4)
Magnesium: 1.7 mg/dL (ref 1.7–2.4)

## 2015-12-29 MED ORDER — SODIUM CHLORIDE 0.9 % IV SOLN: 1000.0000 mL | INTRAVENOUS | Status: DC

## 2015-12-29 MED ORDER — POTASSIUM CHLORIDE 20 MEQ/15ML (10%) PO SOLN
20.0000 meq | ORAL | Status: AC
  Administered 2015-12-29 (×2): 20 meq
  Filled 2015-12-29 (×2): qty 15

## 2015-12-29 MED ORDER — VITAL HIGH PROTEIN PO LIQD: 1000.0000 mL | ORAL | Status: DC

## 2015-12-29 MED ORDER — CHLORHEXIDINE GLUCONATE CLOTH 2 % EX PADS
6.0000 | MEDICATED_PAD | Freq: Every day | CUTANEOUS | Status: DC
  Administered 2015-12-31 – 2016-01-03 (×4): 6 via TOPICAL

## 2015-12-29 MED ORDER — VITAL AF 1.2 CAL PO LIQD
1000.0000 mL | ORAL | Status: DC
  Administered 2015-12-29 – 2016-01-04 (×8): 1000 mL
  Filled 2015-12-29 (×9): qty 1000

## 2015-12-29 MED ORDER — MUPIROCIN 2 % EX OINT
1.0000 "application " | TOPICAL_OINTMENT | Freq: Two times a day (BID) | CUTANEOUS | Status: AC
  Administered 2015-12-29 – 2016-01-03 (×10): 1 via NASAL
  Filled 2015-12-29 (×2): qty 22

## 2015-12-29 MED ORDER — PRO-STAT SUGAR FREE PO LIQD
30.0000 mL | Freq: Two times a day (BID) | ORAL | Status: DC
  Filled 2015-12-29: qty 30

## 2015-12-29 NOTE — H&P (Signed)
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 Rehabilitation in [place] with return to [place]. She has had trach removed twice the last 4 weekd ago by Dr. Lake Bells.  She presents to [facility] ED with increased resp distress, cxr cw multilobar pna. She was intubated by PCCM in ED and a new #6 Shiley trach was placed and she was transferred to ICU on full vent support.  SUBJECTIVE:  Failed PS  VITAL SIGNS: BP 131/76   Pulse 95   Temp 98.1 F (36.7 C) (Axillary)   Resp (!) 22   Ht 5\' 6"  (1.676 m)   Wt 55.2 kg (121 lb 11.1 oz)   LMP 12/01/2010   SpO2 96%   BMI 19.64 kg/m   HEMODYNAMICS:    VENTILATOR SETTINGS: Vent Mode: PRVC FiO2 (%):  [40 %-50 %] 40 % Set Rate:  [22 bmp] 22 bmp Vt Set:  [480 mL] 480 mL PEEP:  [5 cmH20] 5 cmH20 Pressure Support:  [18 cmH20] 18 cmH20 Plateau Pressure:  [21 [id number] cmH20] 21 cmH20  INTAKE / OUTPUT: I/O last 3 completed shifts: In: 2356.7 [I.V.:1531.7; Other:60; IV Piggyback:765] Out: 700 [Urine:700]  PHYSICAL EXAMINATION: General:  WNWF with expressive aphasia  Neuro:  Left arm contracture, expressive aphasia, decreased LOC, lt hip contracture.  HEENT:  Oral mucosa dry, copious oral crusty secretions Cardiovascular: s1 s2  RRR Lungs: coarse rhonchi  Abdomen: Peg in place Musculoskeletal:  New left hip contracture , check x ray Skin:  Hot and dry  LABS:  BMET  Recent Labs Lab 12/28/15 1115 12/28/15 1657 12/29/15 0214  NA 131* 130* 131*  K 3.5 3.3* 3.4*  CL 87* 93* 97*  CO2 31 27 25   BUN 11 10 13   CREATININE 0.37* 0.40* 0.42*  GLUCOSE 206* 134* 96    Electrolytes  Recent Labs Lab 12/28/15 1115 12/28/15 1657  12/29/15 0214  CALCIUM 9.3 8.9 8.4*  MG  --  1.7  --   PHOS  --  2.7  --     CBC  Recent Labs Lab 12/28/15 1115 12/28/15 1657 12/29/15 0214  WBC 18.7* 19.5* 18.5*  HGB 15.1* 13.5 11.5*  HCT 43.7 39.5 34.6*  PLT 319 268 281    Coag's  Recent Labs Lab 12/28/15 1657  APTT 29  INR 1.14    Sepsis Markers  Recent Labs Lab 12/28/15 1525 12/28/15 1628 12/28/15 1657 12/28/15 1928  LATICACIDVEN 2.02* 2.6*  --  2.2*  PROCALCITON  --   --  0.97  --     ABG  Recent Labs Lab 12/28/15 1159 12/28/15 1605 12/29/15 0345  PHART 7.353 7.441 7.507*  PCO2ART 51.3* 42.6 34.7  PO2ART 91.0 60.9* 76.2*    Liver Enzymes  Recent Labs Lab 12/28/15 1115 12/28/15 1657  AST 69* 83*  ALT 129* 122*  ALKPHOS 131* 109  BILITOT 1.0 1.0  ALBUMIN 2.8* 2.5*    Cardiac Enzymes No results for input(s): TROPONINI, PROBNP in the last 168 hours.  Glucose No results for input(s): GLUCAP in the last 168 hours.  Imaging Dg Chest Portable 1 View  Result Date: 12/28/2015 CLINICAL DATA:  65 year old female with history of tracheostomy tube placement. EXAM: PORTABLE CHEST 1  VIEW COMPARISON:  Chest x-ray 12/28/2015. FINDINGS: New tracheostomy tube in position with tip projecting in the midline approximately 4.6 cm above the carina. Again noted is patchy multifocal airspace opacities in the lungs bilaterally (right greater than left), most confluent throughout the right mid to upper lung and the medial aspect of the left lung base, similar to the prior study. No pleural effusions. No evidence of pulmonary edema. Heart size is normal. The patient is rotated to the left on today's exam, resulting in distortion of the mediastinal contours and reduced diagnostic sensitivity and specificity for mediastinal pathology. IMPRESSION: 1. New tracheostomy tube appears appropriately located. 2. Persistent asymmetrically distributed multifocal airspace consolidation concerning for multilobar bronchopneumonia.  Electronically Signed   By: Vinnie Langton M.D.   On: 12/28/2015 14:33   Dg Chest Port 1 View  Result Date: 12/28/2015 CLINICAL DATA:  Fever and shortness of breath. Altered mental status. EXAM: PORTABLE CHEST 1 VIEW COMPARISON:  Chest CT dated 02/08/2014. FINDINGS: Normal sized heart. Patchy airspace opacity in both lungs, greater on the right. No pleural fluid. Diffuse osteopenia. Patient rotation to the left with positional scoliosis. IMPRESSION: Patchy bilateral pneumonia, greater on the right. Electronically Signed   By: Claudie Revering M.D.   On: 12/28/2015 12:01   Dg Hip Unilat With Pelvis 2-3 Views Left  Result Date: 12/28/2015 CLINICAL DATA:  Chronic left slight external rotation. No known injury. Evaluate for fracture. EXAM: DG HIP (WITH OR WITHOUT PELVIS) 2-3V LEFT COMPARISON:  None. FINDINGS: Left hip is unremarkable. No healing or healed fracture. No degenerative changes. IMPRESSION: Negative. Electronically Signed   By: Lorin Picket M.D.   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 Rehabilitation in [place] with return to [place]. She has had trach removed twice the last 4 weekd ago by Dr. Lake Bells.  She presents to [facility] ED with increased resp distress, cxr cw multilobar pna. She was intubated by PCCM in ED and a new #6 Shiley trach was placed and she was transferred to ICU on full vent support.  ASSESSMENT / PLAN:  PULMONARY A: Acute resp decompensation with multilobar pna with hx of trach and open stoma. Unable to tolerate NIMVS. P:   pcxr reviewed, follow in am for trisk collapse PS 12 attempted and failed, increase PS 20, re  attempt NO TC abx ABG reviewed, reduce rate  CARDIOVASCULAR A:  HD stable on admit P:  BP WNL  RENAL A:  HYPOK  P:   Follow labs in am  k supp kvo okay  GASTROINTESTINAL A:   GI protection P:   PPI TF through peg  HEMATOLOGIC A:   DVT prev P:  Add sub q hep  INFECTIOUS A:   Multilobar Pna P:   V/Z per pharmacy Pct unimpressive, dc, she has multi lobar infiltrates In am narrow likely  ENDOCRINE A:   Hyperglycemia  P:   SSI  NEUROLOGIC A:   Hx of Viral Encephalopathy 11/2014 with contracture of left arm and recently left leg. Expressive aphasia.  Worsening mental status 12/3 requiring intubation P:   RASS goal: 0 Mental status checks in ICU limit sedation PT consult  FAMILY  -  Updates: to husband by me  - Inter-disciplinary family meet or Palliative Care meeting due by:  day 7  CCT: 30 min  Lavon Paganini. Titus Mould, MD, [place] Pgr: [facility]

## 2015-12-29 NOTE — Telephone Encounter (Signed)
lmtcb X1 for pt's spouse to make aware of BQ's recs.  Pt needs to be scheduled this week with BQ if possible.

## 2015-12-29 NOTE — Progress Notes (Signed)
[facility] ADULT ICU REPLACEMENT PROTOCOL FOR AM LAB REPLACEMENT ONLY  The patient does apply for the [facility] Adult ICU Electrolyte Replacment Protocol based on the criteria listed below:   1. Is GFR >/= 40 ml/min? Yes.    Patient's GFR today is >60 2. Is urine output >/= 0.5 ml/kg/hr for the last 6 hours? Yes.   Patient's UOP is 0.75 ml/kg/hr 3. Is BUN < 60 mg/dL? Yes.    Patient's BUN today is 13 4. Abnormal electrolyte(s): K+3.4 5. Ordered repletion with: Protocol 6. If a panic level lab has been reported, has the CCM MD in charge been notified? No..   Physician:  Andree Coss Hilliard 12/29/2015 4:06 AM

## 2015-12-29 NOTE — Progress Notes (Addendum)
Initial Nutrition Assessment  DOCUMENTATION CODES:   Not applicable  INTERVENTION:    D/C Vital High Protein   Initiate Vital AF 1.2 at goal rate of 60 ml/h (1440 ml per day) to provide 1728 kcals, 108 gm protein, 1168 ml of free water  NUTRITION DIAGNOSIS:   Inadequate oral intake related to inability to eat as evidenced by NPO status.  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 Rehabilitation in [place] with return to [place]. She has had trach removed twice the last 4 weekd ago by Dr. Lake Bells.  She presents to [facility] ED with increased resp distress, cxr cw multilobar pna. She was intubated by PCCM in ED and a new #6 Shiley trach was placed and she was transferred to ICU on full vent support.  Patient is currently on ventilator support >> trach MV: 9.6 L/min Temp (24hrs), Avg:100.4 F (38 C), Min:98.1 F (36.7 C), Max:102.5 F (39.2 C)  Spoke with pt's husband via telephone. TF regimen PTA was Osmolite 1.5 formula at 125 ml/hr via PEG tube (infused from 4 pm to 8 am). Vital High Protein formula ordered via Adult Tube Feeding Protocol. Unable to complete Nutrition Focused Physical Exam at this time. Labs and medications reviewed.  Diet Order:  Diet NPO time specified  Skin:  Reviewed, no issues  Last BM:  PTA  Height:   Ht Readings from Last 1 Encounters:  12/28/15 5\' 6"  (1.676 m)    Weight:   Wt Readings from Last 1 Encounters:  12/29/15 121 lb 11.1 oz (55.2 kg)    Ideal Body Weight:  59 kg  BMI:  Body mass index is 19.64 kg/m.  Estimated Nutritional Needs:   Kcal:  [id number]  Protein:  90-100  gm  Fluid:  per MD  EDUCATION NEEDS:   No education needs identified at this time  Arthur Holms, RD, LDN Pager #: [phone] After-Hours Pager #: [phone]

## 2015-12-30 ENCOUNTER — Ambulatory Visit: Payer: BC Managed Care – PPO | Admitting: Neurology

## 2015-12-30 ENCOUNTER — Inpatient Hospital Stay (HOSPITAL_COMMUNITY): Payer: BC Managed Care – PPO

## 2015-12-30 ENCOUNTER — Ambulatory Visit: Payer: BC Managed Care – PPO

## 2015-12-30 ENCOUNTER — Ambulatory Visit: Payer: BC Managed Care – PPO | Admitting: Physical Therapy

## 2015-12-30 ENCOUNTER — Ambulatory Visit: Payer: BC Managed Care – PPO | Admitting: Occupational Therapy

## 2015-12-30 LAB — BASIC METABOLIC PANEL
ANION GAP: 9 (ref 5–15)
ANION GAP: 10 (ref 5–15)
BUN: 11 mg/dL (ref 6–20)
BUN: 12 mg/dL (ref 6–20)
CALCIUM: 8.1 mg/dL — AB (ref 8.9–10.3)
CALCIUM: 8.3 mg/dL — AB (ref 8.9–10.3)
CO2: 28 mmol/L (ref 22–32)
CO2: 27 mmol/L (ref 22–32)
CREATININE: 0.35 mg/dL — AB (ref 0.44–1.00)
CREATININE: 0.38 mg/dL — AB (ref 0.44–1.00)
Chloride: 97 mmol/L — ABNORMAL LOW (ref 101–111)
Chloride: 99 mmol/L — ABNORMAL LOW (ref 101–111)
GLUCOSE: 133 mg/dL — AB (ref 65–99)
Glucose, Bld: 130 mg/dL — ABNORMAL HIGH (ref 65–99)
Potassium: 2.9 mmol/L — ABNORMAL LOW (ref 3.5–5.1)
Potassium: 3.7 mmol/L (ref 3.5–5.1)
SODIUM: 136 mmol/L (ref 135–145)
Sodium: 134 mmol/L — ABNORMAL LOW (ref 135–145)

## 2015-12-30 LAB — PHOSPHORUS
PHOSPHORUS: 3.5 mg/dL (ref 2.5–4.6)
PHOSPHORUS: 3.7 mg/dL (ref 2.5–4.6)

## 2015-12-30 LAB — CBC WITH DIFFERENTIAL/PLATELET
BASOS ABS: 0.1 10*3/uL (ref 0.0–0.1)
Basophils Relative: 1 %
EOS PCT: 0 %
Eosinophils Absolute: 0 10*3/uL (ref 0.0–0.7)
HEMATOCRIT: 33 % — AB (ref 36.0–46.0)
HEMOGLOBIN: 11.1 g/dL — AB (ref 12.0–15.0)
LYMPHS ABS: 1.6 10*3/uL (ref 0.7–4.0)
LYMPHS PCT: 11 %
MCH: 28.7 pg (ref 26.0–34.0)
MCHC: 33.6 g/dL (ref 30.0–36.0)
MCV: 85.3 fL (ref 78.0–100.0)
MONOS PCT: 6 %
Monocytes Absolute: 0.8 10*3/uL (ref 0.1–1.0)
Neutro Abs: 11.6 10*3/uL — ABNORMAL HIGH (ref 1.7–7.7)
Neutrophils Relative %: 82 %
Platelets: 317 10*3/uL (ref 150–400)
RBC: 3.87 MIL/uL (ref 3.87–5.11)
RDW: 14.7 % (ref 11.5–15.5)
WBC: 14.1 10*3/uL — AB (ref 4.0–10.5)

## 2015-12-30 LAB — BLOOD GAS, ARTERIAL
ACID-BASE EXCESS: 4.9 mmol/L — AB (ref 0.0–2.0)
BICARBONATE: 28.4 mmol/L — AB (ref 20.0–28.0)
DRAWN BY: 44135
FIO2: 0.4
MECHVT: 480 mL
O2 Saturation: 96.8 %
PEEP/CPAP: 5 cmH2O
Patient temperature: 98.6
RATE: 14 resp/min
pCO2 arterial: 38.2 mmHg (ref 32.0–48.0)
pH, Arterial: 7.484 — ABNORMAL HIGH (ref 7.350–7.450)
pO2, Arterial: 85.2 mmHg (ref 83.0–108.0)

## 2015-12-30 LAB — GLUCOSE, CAPILLARY
GLUCOSE-CAPILLARY: 170 mg/dL — AB (ref 65–99)
GLUCOSE-CAPILLARY: 135 mg/dL — AB (ref 65–99)
Glucose-Capillary: 169 mg/dL — ABNORMAL HIGH (ref 65–99)
Glucose-Capillary: 144 mg/dL — ABNORMAL HIGH (ref 65–99)

## 2015-12-30 LAB — MAGNESIUM
MAGNESIUM: 2.5 mg/dL — AB (ref 1.7–2.4)
Magnesium: 1.8 mg/dL (ref 1.7–2.4)

## 2015-12-30 MED ORDER — POTASSIUM CHLORIDE 20 MEQ/15ML (10%) PO SOLN
40.0000 meq | ORAL | Status: AC
  Administered 2015-12-30 (×2): 40 meq
  Filled 2015-12-30 (×2): qty 30

## 2015-12-30 MED ORDER — VANCOMYCIN HCL 500 MG IV SOLR
500.0000 mg | Freq: Two times a day (BID) | INTRAVENOUS | Status: DC
  Administered 2015-12-30 – 2015-12-31 (×3): 500 mg via INTRAVENOUS
  Filled 2015-12-30 (×4): qty 500

## 2015-12-30 MED ORDER — FENTANYL CITRATE (PF) 100 MCG/2ML IJ SOLN
25.0000 ug | INTRAMUSCULAR | Status: DC | PRN
  Administered 2015-12-30 (×2): 25 ug via INTRAVENOUS
  Filled 2015-12-30 (×2): qty 2

## 2015-12-30 MED ORDER — POTASSIUM CHLORIDE CRYS ER 20 MEQ PO TBCR
40.0000 meq | EXTENDED_RELEASE_TABLET | ORAL | Status: DC
  Filled 2015-12-30: qty 2

## 2015-12-30 MED ORDER — MAGNESIUM SULFATE 4 GM/100ML IV SOLN
INTRAVENOUS | Status: AC
  Filled 2015-12-30: qty 100

## 2015-12-30 MED ORDER — SODIUM CHLORIDE 0.9 % IV SOLN
30.0000 meq | Freq: Once | INTRAVENOUS | Status: AC
  Administered 2015-12-30: 30 meq via INTRAVENOUS
  Filled 2015-12-30: qty 15

## 2015-12-30 MED ORDER — PANTOPRAZOLE SODIUM 40 MG PO PACK
40.0000 mg | PACK | Freq: Every day | ORAL | Status: DC
  Administered 2015-12-30 – 2016-01-04 (×6): 40 mg
  Filled 2015-12-30 (×6): qty 20

## 2015-12-30 MED ORDER — FUROSEMIDE 10 MG/ML IJ SOLN
20.0000 mg | Freq: Two times a day (BID) | INTRAMUSCULAR | Status: DC
  Administered 2015-12-30 – 2015-12-31 (×3): 20 mg via INTRAVENOUS
  Filled 2015-12-30 (×3): qty 2

## 2015-12-30 MED ORDER — MAGNESIUM SULFATE 4 GM/100ML IV SOLN
4.0000 g | Freq: Once | INTRAVENOUS | Status: AC
  Administered 2015-12-30: 4 g via INTRAVENOUS
  Filled 2015-12-30: qty 100

## 2015-12-30 NOTE — Evaluation (Signed)
Physical Therapy Evaluation Patient Details Name: SAVY SERATT MRN: [id number] DOB: 1950-02-14 Today's Date: 12/30/2015   History of Present Illness  65 yo with PMHx of viral encephalopathy 12/16 with resultant LUE contracture, expressive aphasia and trach with trach removed 2x since then last time 4 weeks ago, pt also with new left hip contracture of 2 months. Pt admitted due to CAP with respiratory failure. Trach and vent 12/3  Clinical Impression  Pt with eyes closed on arrival but able to attend to spouse for brief periods and agreeable to sitting up EOB. Pt with maintained LUE elbow flexion and LLE frog leg position in supine with ability to extend left hip and knee with over pressure and increased time. Pt with decreased balance, strength, cognition (presumed due to medication today), function and transfers as well as medical complexity listed above who will benefit from acute therapy to maximize mobility, function, and balance to decrease burden of care. Discharge plan will be highly dependent on cardiopulmonary function.   Pt on PRVC of 40% with RR up to 42 with initiation of mobility able to calm and decline to 35 RR prior to EOB and EOB dropped as low as 24 HR and sats maintained within normal limits    Follow Up Recommendations LTACH;Supervision/Assistance - 24 hour    Equipment Recommendations  None recommended by PT    Recommendations for Other Services OT consult     Precautions / Restrictions Precautions Precautions: Fall Precaution Comments: vent, trach, peg tube Restrictions Weight Bearing Restrictions: No      Mobility  Bed Mobility Overal bed mobility: Needs Assistance Bed Mobility: Rolling;Sidelying to Sit;Sit to Supine Rolling: Total assist Sidelying to sit: Total assist;HOB elevated   Sit to supine: Total assist   General bed mobility comments: Patient unable to assist in transfer process. Patient became distressed during sidelying to sit, with increased  respiration rate to 42. with calming and cueing Breathing returned to 30s with EOB sitting. Pt with max assist to bend RLE for foot to touch floor in sitting, max assist for trunk support in sitting. Pt would attempt to brace with LUE against caregiver to maintain posture in sitting. EOB 5 min then pt fatigued and drifting to sleep with pt returned to supine. Positioned with bil LE straight and supported by pillows  Transfers                 General transfer comment: unable at this time  Ambulation/Gait                Stairs            Wheelchair Mobility    Modified Rankin (Stroke Patients Only)       Balance Overall balance assessment: Needs assistance Sitting-balance support: Feet supported;Single extremity supported Sitting balance-Leahy Scale: Zero                                       Pertinent Vitals/Pain Pain Assessment:  (CPOT= 0)    Home Living Family/patient expects to be discharged to:: Private residence Living Arrangements: Spouse/significant other Available Help at Discharge: Family;Personal care attendant;Available 24 hours/day Type of Home: House       Home Layout: One level Home Equipment: Hospital bed;Wheelchair - manual;Tub bench;Hand held shower head;Other (comment) (stedy)      Prior Function Level of Independence: Needs assistance   Gait / Transfers Assistance Needed: max assist +  1 for bed mobility. max assist sit to stand with stedy. Patient was doing stand pivot until LLE weakness onset 2 months ago. Can do standing pivot max assist to wheelchair.   ADL's / Homemaking Assistance Needed: max assist with bathing, dressing.         Hand Dominance        Extremity/Trunk Assessment   Upper Extremity Assessment: RUE deficits/detail;Difficult to assess due to impaired cognition       LUE Deficits / Details: left UE with baseline elbow flexion and internal shoulder rotation   Lower Extremity Assessment:  LLE deficits/detail;Difficult to assess due to impaired cognition;RLE deficits/detail RLE Deficits / Details: pt with increased tone of right quad in bed and EOb with increased time to relax and flex knee EOB with over pressure LLE Deficits / Details: in supine pt in frog leg position with ability to achieve full knee extension with over pressure, increased time and hip 20 flexion, no AROM noted     Communication   Communication: Tracheostomy  Cognition Arousal/Alertness: Awake/alert Behavior During Therapy: Flat affect Overall Cognitive Status: Impaired/Different from baseline Area of Impairment: Attention   Current Attention Level: Focused           General Comments: Pt with flat affect and blank stare through majority of session, did nod yes to wanting to sit up and would follow grossly 25% of commands    General Comments      Exercises     Assessment/Plan    PT Assessment Patient needs continued PT services  PT Problem List Decreased strength;Decreased mobility;Decreased coordination;Decreased activity tolerance;Decreased cognition;Decreased balance          PT Treatment Interventions Therapeutic activities;Balance training;Therapeutic exercise;Patient/family education;DME instruction;Functional mobility training;Neuromuscular re-education    PT Goals (Current goals can be found in the Care Plan section)  Acute Rehab PT Goals Patient Stated Goal: return home PT Goal Formulation: With family Time For Goal Achievement: 01/13/16 Potential to Achieve Goals: Fair    Frequency Min 3X/week   Barriers to discharge        Co-evaluation               End of Session   Activity Tolerance: Patient limited by lethargy Patient left: in bed;with call bell/phone within reach;with family/visitor present Nurse Communication: Mobility status         Time: [id number] PT Time Calculation (min) (ACUTE ONLY): 36 min   Charges:   PT Evaluation $PT Eval High  Complexity: 1 Procedure PT Treatments $Therapeutic Activity: 8-22 mins   PT G Codes:        Ceriah Kohler B Fernandez Kenley 2016-01-21, 2:55 PM  Elwyn Reach, [place]

## 2015-12-30 NOTE — Progress Notes (Signed)
Patient transported from [facility] to [id number] with no complications. No respiratory distress noted.

## 2015-12-30 NOTE — Progress Notes (Signed)
Pharmacy Antibiotic Note  Judy Spears is a 65 y.o. female admitted on 12/28/2015 with declining mentation Pharmacy consulted for vancomycin and Zosyn dosing for sepsis. 12/3 Trach asp-  few SA, few pseudomonas aeruginosa,   Tmax 101.4, WBC 18.5>14.1, strep urinary antigen positive. New trach 12/3.   Plan: - continue Vanc 500 IV Q12H  - continue zosyn 3.375gm IV Q8H, 4 hr infusion - Monitor renal fxn, clinical progress, vanc trough prior to 4th dose  Height: 5\' 6"  (167.6 cm) Weight: 122 lb 9.2 oz (55.6 kg) IBW/kg (Calculated) : 59.3  Temp (24hrs), Avg:99.8 F (37.7 C), Min:97.6 F (36.4 C), Max:101.4 F (38.6 C)   Recent Labs Lab 12/28/15 1115 12/28/15 1127 12/28/15 1525 12/28/15 1628 12/28/15 1657 12/28/15 1928 12/29/15 0214 12/30/15 0442  WBC 18.7*  --   --   --  19.5*  --  18.5* 14.1*  CREATININE 0.37*  --   --   --  0.40*  --  0.42* 0.35*  LATICACIDVEN  --  2.31* 2.02* 2.6*  --  2.2*  --   --     Estimated Creatinine Clearance: 62.4 mL/min (by C-G formula based on SCr of 0.35 mg/dL (L)).    Allergies  Allergen Reactions  . Pollen Extract Other (See Comments)    Running nose  . Tetracyclines & Related     Blisters, photosensitivity    Antimicrobials this admission:  Vanc 12/3 >> Zosyn 12/3 >>  Dose adjustments this admission:  N/A  Microbiology results:  12/3 UCx -  NGF 12/3 BCx x2 -ngtd 12/3 MRSA PCR (+) 12/3 Trach asp-  few SA, few pseudomonas aeruginosa 12/3  BAL  Eudelia Bunch, Pharm.D. [id number] 12/30/2015 12:11 PM

## 2015-12-30 NOTE — Telephone Encounter (Signed)
Husband picked up this morning.

## 2015-12-30 NOTE — Progress Notes (Signed)
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 Rehabilitation in [place] with return to [place]. She has had trach removed twice the last 4 weekd ago by Dr. Lake Bells.  She presents to [facility] ED with increased resp distress, cxr cw multilobar pna. She was intubated by PCCM in ED and a new #6 Shiley trach was placed and she was transferred to ICU on full vent support.  SUBJECTIVE:  Calm on vent PS 18 failed with failed RR  VITAL SIGNS: BP 121/84 (BP Location: Left Arm)   Pulse 90   Temp 97.6 F (36.4 C) (Oral)   Resp 19   Ht 5\' 6"  (1.676 m)   Wt 55.6 kg (122 lb 9.2 oz)   LMP 12/01/2010   SpO2 98%   BMI 19.78 kg/m   HEMODYNAMICS:    VENTILATOR SETTINGS: Vent Mode: PRVC FiO2 (%):  [40 %] 40 % Set Rate:  [14 bmp] 14 bmp Vt Set:  [480 mL] 480 mL PEEP:  [5 cmH20] 5 cmH20 Plateau Pressure:  [17 cmH20-21 cmH20] 21 cmH20  INTAKE / OUTPUT: I/O last 3 completed shifts: In: [id number] [I.V.:3600; Other:170; NG/GT:1200; IV Piggyback:965] Out: 2645 [Urine:2645]  PHYSICAL EXAMINATION: General:  WNWF with expressive aphasia  Neuro:  Left arm contracture, expressive aphasia, decreased LOC, lt hip contracture.  HEENT:  Trach clean Cardiovascular: s1 s2  RRR Lungs: coarse little less apical Abdomen: Peg in place Musculoskeletal:  New left hip contracture , check x ray Skin:  Hot and dry  LABS:  BMET  Recent Labs Lab 12/28/15 1657 12/29/15 0214 12/30/15 0442  NA 130* 131* 134*  K 3.3* 3.4* 2.9*  CL 93* 97* 97*  CO2 27 25 28   BUN 10 13 11   CREATININE 0.40* 0.42* 0.35*  GLUCOSE 134* 96 133*    Electrolytes  Recent Labs Lab 12/28/15 1657 12/29/15 0214  12/29/15 1229 12/29/15 1756 12/30/15 0442  CALCIUM 8.9 8.4*  --   --  8.1*  MG 1.7  --  1.7 1.7 1.8  PHOS 2.7  --  3.4 3.5 3.5    CBC  Recent Labs Lab 12/28/15 1657 12/29/15 0214 12/30/15 0442  WBC 19.5* 18.5* 14.1*  HGB 13.5 11.5* 11.1*  HCT 39.5 34.6* 33.0*  PLT 268 281 317    Coag's  Recent Labs Lab 12/28/15 1657  APTT 29  INR 1.14    Sepsis Markers  Recent Labs Lab 12/28/15 1525 12/28/15 1628 12/28/15 1657 12/28/15 1928  LATICACIDVEN 2.02* 2.6*  --  2.2*  PROCALCITON  --   --  0.97  --     ABG  Recent Labs Lab 12/28/15 1605 12/29/15 0345 12/30/15 0421  PHART 7.441 7.507* 7.484*  PCO2ART 42.6 34.7 38.2  PO2ART 60.9* 76.2* 85.2    Liver Enzymes  Recent Labs Lab 12/28/15 1115 12/28/15 1657  AST 69* 83*  ALT 129* 122*  ALKPHOS 131* 109  BILITOT 1.0 1.0  ALBUMIN 2.8* 2.5*    Cardiac Enzymes No results for input(s): TROPONINI, PROBNP in the last 168 hours.  Glucose  Recent Labs Lab 12/29/15 1203 12/29/15 1632 12/29/15 2008 12/29/15 2331 12/30/15 0901  GLUCAP 107* 120* 117* 116* 169*    Imaging Dg Chest  Port 1 View  Result Date: 12/30/2015 CLINICAL DATA:  Pneumonia.  Tracheostomy EXAM: PORTABLE CHEST 1 VIEW COMPARISON:  12/28/2015 FINDINGS: Tracheostomy remains in satisfactory position and unchanged. Diffuse bilateral airspace disease. Mild improvement in right upper lobe airspace disease disease. No change in bibasilar airspace disease. No significant pleural fluid identified. IMPRESSION: Bilateral infiltrates, with some improvement in the right upper lobe. No change in bibasilar airspace disease. Electronically Signed   By: Franchot Gallo M.D.   On: 12/30/2015 08:23     STUDIES:    CULTURES: 12/3 bc x 2>> 12/3 BAL>> 12/3 UC>>  ANTIBIOTICS: 12/3 vanc>>12/5 12/3 Zoysn>>  SIGNIFICANT EVENTS: 12/3 admit to ICU  LINES/TUBES: 12/3 ett>>12/3 12/3 #6 Shiley(JY)>>  DISCUSSION: 65 yo female with a PMH of Viral  Encephalopathy dx 11/2014 which resulted in left arm contracture, expressive aphasia an tracheostomy placement. Rehabilitation in [place] with return to [place]. She has had trach removed twice the last 4 weekd ago by Dr. Lake Bells.  She presents to [facility] ED with increased resp distress, cxr cw multilobar pna. She was intubated by PCCM in ED and a new #6 Shiley trach was placed and she was transferred to ICU on full vent support.  ASSESSMENT / PLAN:  PULMONARY A: Acute resp decompensation with multilobar pna with hx of trach and open stoma. Unable to tolerate NIMVS. P:   pcxr is improving, follow in am with failed weaning Wean PS 20-22 would tolerate rates 30 if volumes are at 6-8 cc/kg Even balance goals abg reviewed, may need drop by 1 cc/kg Consider lasix  CARDIOVASCULAR A:  HD stable on admit P:  BP WNL  RENAL A:  HYPOK  P:   Lasix supp k, mag Chem in am  kvo  GASTROINTESTINAL A:   GI protection P:   PPI TF through peg Monitor BM  HEMATOLOGIC A:   DVT prev P:  Add sub q hep  INFECTIOUS A:   Multilobar Pna P:   V/Z per pharmacy - dc vanc Keep zosyn for now If bloody secretions increase would CT chets assess for necrotizing process  ENDOCRINE A:   Hyperglycemia  P:   SSI  NEUROLOGIC A:   Hx of Viral Encephalopathy 11/2014 with contracture of left arm and recently left leg. Expressive aphasia.  Worsening mental status 12/3 requiring intubation P:   RASS goal: 0 Mental status checks in ICU limit sedation, fent drop dose PT consulted  FAMILY  - Updates: to husband by me  - Inter-disciplinary family meet or Palliative Care meeting due by:  day [place]. Titus Mould, MD, [place] Pgr: [facility]

## 2015-12-30 NOTE — Progress Notes (Signed)
Pt placed back on full vent support due to increased RR 45-50.

## 2015-12-30 NOTE — Progress Notes (Signed)
[facility] ADULT ICU REPLACEMENT PROTOCOL FOR AM LAB REPLACEMENT ONLY  The patient does apply for the [facility] Adult ICU Electrolyte Replacment Protocol based on the criteria listed below:   1. Is GFR >/= 40 ml/min? Yes.    Patient's GFR today is >60 2. Is urine output >/= 0.5 ml/kg/hr for the last 6 hours? Yes.   Patient's UOP is 2.3 ml/kg/hr 3. Is BUN < 60 mg/dL? Yes.    Patient's BUN today is 11 4. Abnormal electrolyte(s): Potassium 2.9 5. Ordered repletion with: Potassium per protocol 6. If a panic level lab has been reported, has the CCM MD in charge been notified? Yes.  .   Physician:  Sullivan Lone, Khiree Bukhari P 12/30/2015 5:52 AM

## 2015-12-30 NOTE — Plan of Care (Signed)
Problem: Education: Goal: Knowledge of [facility] Education information/materials will improve Outcome: Progressing Informed patient and husband of new room with some teach back displayed

## 2015-12-31 ENCOUNTER — Encounter (HOSPITAL_COMMUNITY): Payer: Self-pay | Admitting: *Deleted

## 2015-12-31 DIAGNOSIS — J9601 Acute respiratory failure with hypoxia: Secondary | ICD-10-CM

## 2015-12-31 DIAGNOSIS — A4152 Sepsis due to Pseudomonas: Secondary | ICD-10-CM

## 2015-12-31 LAB — BASIC METABOLIC PANEL
Anion gap: 12 (ref 5–15)
BUN: 13 mg/dL (ref 6–20)
CO2: 30 mmol/L (ref 22–32)
Calcium: 8.9 mg/dL (ref 8.9–10.3)
Chloride: 96 mmol/L — ABNORMAL LOW (ref 101–111)
Creatinine, Ser: 0.43 mg/dL — ABNORMAL LOW (ref 0.44–1.00)
GFR calc Af Amer: >60 mL/min (ref >60)
GLUCOSE: 120 mg/dL — AB (ref 65–99)
POTASSIUM: 3.6 mmol/L (ref 3.5–5.1)
Sodium: 138 mmol/L (ref 135–145)

## 2015-12-31 LAB — VANCOMYCIN, TROUGH: Vancomycin Tr: 6 ug/mL — ABNORMAL LOW (ref 15–20)

## 2015-12-31 LAB — GLUCOSE, CAPILLARY
GLUCOSE-CAPILLARY: 115 mg/dL — AB (ref 65–99)
GLUCOSE-CAPILLARY: 146 mg/dL — AB (ref 65–99)
GLUCOSE-CAPILLARY: 172 mg/dL — AB (ref 65–99)
Glucose-Capillary: 155 mg/dL — ABNORMAL HIGH (ref 65–99)
Glucose-Capillary: 146 mg/dL — ABNORMAL HIGH (ref 65–99)
Glucose-Capillary: 144 mg/dL — ABNORMAL HIGH (ref 65–99)

## 2015-12-31 LAB — PNEUMOCYSTIS JIROVECI SMEAR BY DFA: PNEUMOCYSTIS JIROVECI AG: NEGATIVE

## 2015-12-31 LAB — CULTURE, RESPIRATORY: SPECIAL REQUESTS: NORMAL

## 2015-12-31 LAB — CULTURE, RESPIRATORY W GRAM STAIN

## 2015-12-31 MED ORDER — DEXTROSE 5 % IV SOLN
2.0000 g | Freq: Three times a day (TID) | INTRAVENOUS | Status: DC
  Administered 2015-12-31 – 2016-01-01 (×3): 2 g via INTRAVENOUS
  Filled 2015-12-31 (×5): qty 2

## 2015-12-31 MED ORDER — PIPERACILLIN-TAZOBACTAM 3.375 G IVPB
3.3750 g | Freq: Three times a day (TID) | INTRAVENOUS | Status: DC
  Filled 2015-12-31 (×2): qty 50

## 2015-12-31 NOTE — Progress Notes (Signed)
Pharmacy Antibiotic Note Judy Spears is a 65 y.o. female admitted on 12/28/2015 with respiratory failure and found to have multilobar pna. Empirically started on Zosyn and vancomycin for treatment. Trach aspirate from 12/3 has grown MSSA and pseudomonas that is pan S. Pharmacy asked to transition to Ceftazidime for treatment.   Plan: 1. Begin Ceftazidime 2 grams IV every 8 hours 2. F/u on planned duration of treatment    Height: 5\' 6"  (167.6 cm) Weight: 126 lb 8.7 oz (57.4 kg) IBW/kg (Calculated) : 59.3  Temp (24hrs), Avg:99.4 F (37.4 C), Min:97.5 F (36.4 C), Max:100.1 F (37.8 C)   Recent Labs Lab 12/28/15 1115 12/28/15 1127 12/28/15 1525 12/28/15 1628 12/28/15 1657 12/28/15 1928 12/29/15 0214 12/30/15 0442 12/30/15 1932 12/31/15 0236 12/31/15 1258  WBC 18.7*  --   --   --  19.5*  --  18.5* 14.1*  --   --   --   CREATININE 0.37*  --   --   --  0.40*  --  0.42* 0.35* 0.38* 0.43*  --   LATICACIDVEN  --  2.31* 2.02* 2.6*  --  2.2*  --   --   --   --   --   VANCOTROUGH  --   --   --   --   --   --   --   --   --   --  6*    Estimated Creatinine Clearance: 64.4 mL/min (by C-G formula based on SCr of 0.43 mg/dL (L)).    Allergies  Allergen Reactions  . Pollen Extract Other (See Comments)    Running nose  . Tetracyclines & Related     Blisters, photosensitivity   Antimicrobials this admission:  Vanc 12/3 >> 12/6 Zosyn 12/3 >> 12/6  Ceftazidime 12/6 >>    Microbiology results:  12/3 UCx -  ngF 12/3 BCx x2 -ngtd 12/3 MRSA PCR (+) 12/3 Trach asp-  MSSA and pseudomonas pan S 12/3 influenza: negative 12/3 strep pneumo antigen: positive     Thank you for allowing pharmacy to be a part of this patient's care.  Vincenza Hews, PharmD, BCPS 12/31/2015, 3:03 PM Pager: [phone]

## 2015-12-31 NOTE — Progress Notes (Signed)
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 Rehabilitation in [place] with return to [place]. She has had trach removed twice the last 4 weekd ago by Dr. Lake Bells.  She presents to [facility] ED with increased resp distress, cxr cw multilobar pna. She was intubated by PCCM in ED and a new #6 Shiley trach was placed and she was transferred to ICU on full vent support.  SUBJECTIVE:  No acute events. Patient failed pressure support wean.   REVIEW OF SYSTEMS:  Unable to obtain with expressive aphasia.   VITAL SIGNS: BP (!) 136/93   Pulse 87   Temp 99.1 F (37.3 C) (Oral)   Resp (!) 32   Ht 5\' 6"  (1.676 m)   Wt 126 lb 8.7 oz (57.4 kg)   LMP 12/01/2010   SpO2 96%   BMI 20.42 kg/m   HEMODYNAMICS:    VENTILATOR SETTINGS: Vent Mode: PRVC FiO2 (%):  [40 %] 40 % Set Rate:  [14 bmp] 14 bmp Vt Set:  [420 mL] 420 mL PEEP:  [5 cmH20] 5 cmH20 Plateau Pressure:  [15 cmH20-22 cmH20] 17 cmH20  INTAKE / OUTPUT: I/O last 3 completed shifts: In: 3265 [I.V.:710; NG/GT:1740; IV Piggyback:815] Out: 4700 [Urine:4700]  PHYSICAL EXAMINATION: General:  Eyes open. No distress. No family at bedside. Neuro:  Aphasic. Doesn't follow commands. Attends to voice.  HEENT:  Tracheostomy in place. No scleral icterus. Moist membranes. Cardiovascular:  Regular rate. No edema. Unable to appreciate JVD. Pulmonary:  Coarse breath sounds bilaterally. Symmetric chest wall rise on ventilator. Abdomen:  Soft. PEG in place. Nondistended.  Integument:  Warm & dry. No rash on exposed skin.   LABS:  BMET  Recent Labs Lab 12/30/15 0442 12/30/15 1932 12/31/15 0236  NA 134* 136 138  K 2.9* 3.7 3.6  CL  97* 99* 96*  CO2 28 27 30   BUN 11 12 13   CREATININE 0.35* 0.38* 0.43*  GLUCOSE 133* 130* 120*    Electrolytes  Recent Labs Lab 12/29/15 1756 12/30/15 0442 12/30/15 1657 12/30/15 1932 12/31/15 0236  CALCIUM  --  8.1*  --  8.3* 8.9  MG 1.7 1.8 2.5*  --   --   PHOS 3.5 3.5 3.7  --   --     CBC  Recent Labs Lab 12/28/15 1657 12/29/15 0214 12/30/15 0442  WBC 19.5* 18.5* 14.1*  HGB 13.5 11.5* 11.1*  HCT 39.5 34.6* 33.0*  PLT 268 281 317    Coag's  Recent Labs Lab 12/28/15 1657  APTT 29  INR 1.14    Sepsis Markers  Recent Labs Lab 12/28/15 1525 12/28/15 1628 12/28/15 1657 12/28/15 1928  LATICACIDVEN 2.02* 2.6*  --  2.2*  PROCALCITON  --   --  0.97  --     ABG  Recent Labs Lab 12/28/15 1605 12/29/15 0345 12/30/15 0421  PHART 7.441 7.507* 7.484*  PCO2ART 42.6 34.7 38.2  PO2ART 60.9* 76.2* 85.2    Liver Enzymes  Recent Labs Lab 12/28/15 1115 12/28/15 1657  AST 69* 83*  ALT 129* 122*  ALKPHOS 131* 109  BILITOT 1.0 1.0  ALBUMIN 2.8* 2.5*    Cardiac Enzymes No results for input(s): TROPONINI, PROBNP in the last  168 hours.  Glucose  Recent Labs Lab 12/30/15 1935 12/31/15 0017 12/31/15 0402 12/31/15 0742 12/31/15 1228 12/31/15 1658  GLUCAP 135* 146* 115* 172* 155* 146*    Imaging No results found.   STUDIES:    MICROBIOLOGY: MRSA PCR 12/3:  Positive Influenza PCR 12/3:  Negative  Blood Ctx x2 12/3 >> Urine Ctx 12/3:  Negative Tracheal Asp Ctx 12/3:  Pseudomonas aeruginosa & MSSA Urine Streptococcal Antigen 12/3:  Positive   ANTIBIOTICS: Vancomycin 12/3 - 12/5 Zosyn 12/3 - 12/6 Fortaz 12/6 >>  SIGNIFICANT EVENTS: 12/03 - admit to ICU  LINES/TUBES: OETT 12/3 - 12/3 Trach #6 Shiley(JY) 12/3 >> PEG >> Foley 12/3 >> PIV  ASSESSMENT / PLAN:  65 y.o. female w/ h/o viral encephalopathy in November 2016 that resulted in an expressive aphasia & prior respiratory failure with tracheostomy placement. Subsequently  decannulated twice with last episode 4 weeks prior to admission. Presents with multifocal pneumonia and acute hypoxic respiratory failure. Patient successfully re-trached and currently on narrowed spectrum antibiotic coverage for her pneumonia.  1. Acute Hypoxic Respiratory Failure:  Continuing pressure support wean and hopefully eventually transition back to a tracheostomy collar. 2. Pseudomonas, Streptococcus pneumoniae, & MSSA Multilobar Pneumonia:  Currently on Day # 4/14 of Fortaz. 3. Sepsis:  Secondary to pneumonia. 4. Acute Encephalopathy:  Limiting sedation. Likely a component of hypoxia and toxic metabolic.  5. Hyperglycemia:  No h/o diabetes. Accu-Checks q4hr with MD notification parameters. 6. H/O Viral Encephalopathy w/ Expressive Aphasia:  Consulted PT already. Limiting sedation. Fentanyl for pain. 7. Prophylaxis:  Protonix VT daily, heparin [place] q8hr, & SCDs. 8. Diet: Continuing tube feedings.  Transitioning primary care to [place] following as a Optometrist starting 12/7.  Sonia Baller Ashok Cordia, M.D. [facility] Pulmonary & Critical Care Pager:  [phone] After 3pm or if no response, call [phone] 7:40 PM 12/31/15

## 2015-12-31 NOTE — Progress Notes (Signed)
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 Rehabilitation in [place] with return to [place]. She has had trach removed twice the last 4 weekd ago by Dr. Lake Bells.  She presents to [facility] ED with increased resp distress, cxr cw multilobar pna. She was intubated by PCCM in ED and a new #6 Shiley trach was placed and she was transferred to ICU on full vent support.  SUBJECTIVE:  Calm on vent Failed PSV of 20 d/t rising rr and tachycardia   VITAL SIGNS: BP (!) 148/88   Pulse 93   Temp 99.4 F (37.4 C) (Axillary)   Resp (!) 32   Ht 5\' 6"  (1.676 m)   Wt 126 lb 8.7 oz (57.4 kg)   LMP 12/01/2010   SpO2 96%   BMI 20.42 kg/m   HEMODYNAMICS:    VENTILATOR SETTINGS: Vent Mode: PRVC FiO2 (%):  [40 %] 40 % Set Rate:  [14 bmp] 14 bmp Vt Set:  [420 mL] 420 mL PEEP:  [5 cmH20] 5 cmH20 Plateau Pressure:  [15 cmH20-22 cmH20] 20 cmH20  INTAKE / OUTPUT: I/O last 3 completed shifts: In: [id number] [I.V.:1840; NG/GT:1740; IV Piggyback:865] Out: [id number]  PHYSICAL EXAMINATION: General:  WNWF with expressive aphasia  Neuro:  Left arm contracture, expressive aphasia, decreased LOC, lt hip contracture.  HEENT:  Trach clean Cardiovascular: s1 s2  RRR Lungs: coarse scattered rhonchi. Equal chest rise  Abdomen: Peg in place Musculoskeletal:  New left hip contracture , check x ray Skin:  Hot and dry  LABS:  BMET  Recent Labs Lab 12/30/15 0442 12/30/15 1932 12/31/15 0236  NA 134* 136 138  K 2.9* 3.7 3.6  CL 97* 99* 96*  CO2 28 27 30   BUN 11 12 13   CREATININE 0.35* 0.38* 0.43*  GLUCOSE 133* 130* 120*    Electrolytes  Recent Labs Lab 12/29/15 1756 12/30/15 0442  12/30/15 1657 12/30/15 1932 12/31/15 0236  CALCIUM  --  8.1*  --  8.3* 8.9  MG 1.7 1.8 2.5*  --   --   PHOS 3.5 3.5 3.7  --   --     CBC  Recent Labs Lab 12/28/15 1657 12/29/15 0214 12/30/15 0442  WBC 19.5* 18.5* 14.1*  HGB 13.5 11.5* 11.1*  HCT 39.5 34.6* 33.0*  PLT 268 281 317    Coag's  Recent Labs Lab 12/28/15 1657  APTT 29  INR 1.14    Sepsis Markers  Recent Labs Lab 12/28/15 1525 12/28/15 1628 12/28/15 1657 12/28/15 1928  LATICACIDVEN 2.02* 2.6*  --  2.2*  PROCALCITON  --   --  0.97  --     ABG  Recent Labs Lab 12/28/15 1605 12/29/15 0345 12/30/15 0421  PHART 7.441 7.507* 7.484*  PCO2ART 42.6 34.7 38.2  PO2ART 60.9* 76.2* 85.2    Liver Enzymes  Recent Labs Lab 12/28/15 1115 12/28/15 1657  AST 69* 83*  ALT 129* 122*  ALKPHOS 131* 109  BILITOT 1.0 1.0  ALBUMIN 2.8* 2.5*    Cardiac Enzymes No results for input(s): TROPONINI, PROBNP in the last 168 hours.  Glucose  Recent Labs Lab 12/30/15 1221 12/30/15 1556 12/30/15 1935 12/31/15 0017 12/31/15 0402 12/31/15 0742  GLUCAP 170*  144* 135* 146* 115* 172*    Imaging No results found.   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 Rehabilitation in [place] with return to [place]. She has had trach removed twice the last 4 weeks ago by Dr. Lake Bells.  She presents to [facility] ED with increased resp distress, cxr cw multilobar pna. She was intubated by PCCM in ED and a new #6 Shiley trach was placed and she was transferred to ICU on full vent support. Now improving some but not weaning. Prelim sputum growing SA and pseudomonas. Will cont abx, await cultures and  re-attempt wean in am. I suspect she may be a slow wean.    ASSESSMENT / PLAN:   Acute resp decompensation with multilobar pna with hx of trach and open stoma. Unable to tolerate NIMVS. Multilobar Pna-->prelim w/ few SA and pseudomonas  Plan   Cont V/Z until (s) completed worry about resistant org w/ PA  Repeat CXR in am Ck cortisol and PO4 in am  Cont daily attempts to wean     Hyperglycemia  P:   SSI    Hx of Viral Encephalopathy 11/2014 with contracture of left arm and recently left leg. Expressive aphasia.  Worsening mental status 12/3 requiring intubation Plan  RASS goal: 0 limit sedation, fent drop dose PT consulted  FAMILY  - Updates: to husband by me  - Inter-disciplinary family meet or Palliative Care meeting due by:  day 7

## 2016-01-01 ENCOUNTER — Encounter: Payer: BC Managed Care – PPO | Admitting: Occupational Therapy

## 2016-01-01 ENCOUNTER — Ambulatory Visit: Payer: BC Managed Care – PPO | Admitting: Physical Therapy

## 2016-01-01 ENCOUNTER — Inpatient Hospital Stay (HOSPITAL_COMMUNITY): Payer: BC Managed Care – PPO

## 2016-01-01 DIAGNOSIS — J151 Pneumonia due to Pseudomonas: Secondary | ICD-10-CM

## 2016-01-01 DIAGNOSIS — J9601 Acute respiratory failure with hypoxia: Secondary | ICD-10-CM

## 2016-01-01 DIAGNOSIS — G9340 Encephalopathy, unspecified: Secondary | ICD-10-CM

## 2016-01-01 DIAGNOSIS — R569 Unspecified convulsions: Secondary | ICD-10-CM

## 2016-01-01 DIAGNOSIS — A403 Sepsis due to Streptococcus pneumoniae: Secondary | ICD-10-CM

## 2016-01-01 DIAGNOSIS — J15211 Pneumonia due to Methicillin susceptible Staphylococcus aureus: Secondary | ICD-10-CM

## 2016-01-01 LAB — GLUCOSE, CAPILLARY
GLUCOSE-CAPILLARY: 148 mg/dL — AB (ref 65–99)
GLUCOSE-CAPILLARY: 156 mg/dL — AB (ref 65–99)
GLUCOSE-CAPILLARY: 139 mg/dL — AB (ref 65–99)
Glucose-Capillary: 129 mg/dL — ABNORMAL HIGH (ref 65–99)
Glucose-Capillary: 148 mg/dL — ABNORMAL HIGH (ref 65–99)
Glucose-Capillary: 154 mg/dL — ABNORMAL HIGH (ref 65–99)

## 2016-01-01 MED ORDER — DEXTROSE 5 % IV SOLN
2.0000 g | Freq: Three times a day (TID) | INTRAVENOUS | Status: AC
  Administered 2016-01-01 – 2016-01-03 (×8): 2 g via INTRAVENOUS
  Filled 2016-01-01 (×8): qty 2

## 2016-01-01 MED ORDER — INSULIN ASPART 100 UNIT/ML ~~LOC~~ SOLN
0.0000 [IU] | SUBCUTANEOUS | Status: DC
  Administered 2016-01-01: 1 [IU] via SUBCUTANEOUS
  Administered 2016-01-01: 2 [IU] via SUBCUTANEOUS
  Administered 2016-01-01 – 2016-01-02 (×5): 1 [IU] via SUBCUTANEOUS
  Administered 2016-01-03: 2 [IU] via SUBCUTANEOUS

## 2016-01-01 MED ORDER — SODIUM CHLORIDE 0.9 % IV SOLN
1000.0000 mg | Freq: Once | INTRAVENOUS | Status: AC
  Administered 2016-01-01: 1000 mg via INTRAVENOUS
  Filled 2016-01-01: qty 10

## 2016-01-01 MED ORDER — LORAZEPAM 2 MG/ML IJ SOLN
2.0000 mg | Freq: Once | INTRAMUSCULAR | Status: AC
  Administered 2016-01-01: 2 mg via INTRAVENOUS
  Filled 2016-01-01: qty 1

## 2016-01-01 NOTE — Progress Notes (Signed)
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 Rehabilitation in [place] with return to [place]. She has had trach removed twice the last 4 weekd ago by Dr. Lake Bells.  She presents to [facility] ED with increased resp distress, cxr cw multilobar pna. She was intubated by PCCM in ED and a new #6 Shiley trach was placed and she was transferred to ICU on full vent support.  SUBJECTIVE:  No acute events. Weaning on pressure support. Some questionable new neurologic deficits today being addressed by EEG/Neurology.  REVIEW OF SYSTEMS:  Unable to obtain with expressive aphasia.   VITAL SIGNS: BP (!) 143/91 (BP Location: Right Arm)   Pulse 87   Temp 99.9 F (37.7 C) (Axillary)   Resp (!) 22   Ht 5\' 6"  (1.676 m)   Wt 118 lb 6.2 oz (53.7 kg)   LMP 12/01/2010   SpO2 91%   BMI 19.11 kg/m   HEMODYNAMICS:    VENTILATOR SETTINGS: Vent Mode: CPAP;PSV FiO2 (%):  [40 %] 40 % Set Rate:  [14 bmp] 14 bmp Vt Set:  [420 mL] 420 mL PEEP:  [5 cmH20] 5 cmH20 Pressure Support:  [10 cmH20] 10 cmH20 Plateau Pressure:  [17 cmH20-20 cmH20] 19 cmH20  INTAKE / OUTPUT: I/O last 3 completed shifts: In: 2700 [I.V.:570; NG/GT:1680; IV Piggyback:450] Out: 2525 [Urine:2525]  PHYSICAL EXAMINATION: General:  Eyes open. Family at bedside. Forward gaze. Neuro:  Aphasic. Doesn't follow commands. Not attending to voice. HEENT:  Tracheostomy in place. No scleral injection. Cardiovascular:  Regular rate. No edema. Unable to appreciate JVD. Pulmonary:  Coarse breath sounds bilaterally improving. Symmetric chest wall rise on ventilator. Abdomen:  Soft. PEG in place. Nondistended.  Integument:  Warm & dry. No  rash on exposed skin.   LABS:  BMET  Recent Labs Lab 12/30/15 0442 12/30/15 1932 12/31/15 0236  NA 134* 136 138  K 2.9* 3.7 3.6  CL 97* 99* 96*  CO2 28 27 30   BUN 11 12 13   CREATININE 0.35* 0.38* 0.43*  GLUCOSE 133* 130* 120*    Electrolytes  Recent Labs Lab 12/29/15 1756 12/30/15 0442 12/30/15 1657 12/30/15 1932 12/31/15 0236  CALCIUM  --  8.1*  --  8.3* 8.9  MG 1.7 1.8 2.5*  --   --   PHOS 3.5 3.5 3.7  --   --     CBC  Recent Labs Lab 12/28/15 1657 12/29/15 0214 12/30/15 0442  WBC 19.5* 18.5* 14.1*  HGB 13.5 11.5* 11.1*  HCT 39.5 34.6* 33.0*  PLT 268 281 317    Coag's  Recent Labs Lab 12/28/15 1657  APTT 29  INR 1.14    Sepsis Markers  Recent Labs Lab 12/28/15 1525 12/28/15 1628 12/28/15 1657 12/28/15 1928  LATICACIDVEN 2.02* 2.6*  --  2.2*  PROCALCITON  --   --  0.97  --     ABG  Recent Labs Lab 12/28/15 1605 12/29/15 0345 12/30/15 0421  PHART 7.441 7.507* 7.484*  PCO2ART 42.6 34.7 38.2  PO2ART 60.9* 76.2* 85.2    Liver Enzymes  Recent Labs Lab 12/28/15 1115 12/28/15 1657  AST 69* 83*  ALT 129* 122*  ALKPHOS 131* 109  BILITOT 1.0 1.0  ALBUMIN 2.8* 2.5*    Cardiac Enzymes No results for input(s): TROPONINI, PROBNP in the last 168 hours.  Glucose  Recent Labs Lab 12/31/15 1658 12/31/15 1948 01/01/16 0032 01/01/16 0521 01/01/16 0756 01/01/16 1212  GLUCAP 146* 144* 148* 154* 148* 156*    Imaging Dg Chest Port 1 View  Result Date: 01/01/2016 CLINICAL DATA:  Tracheostomy placed 4 days ago.  Fever. EXAM: PORTABLE CHEST 1 VIEW COMPARISON:  December 30, 2015 FINDINGS: A tracheostomy tube is in good position. No pneumothorax. Diffuse interstitial opacities suggest mild edema. More focal infiltrate is seen in the medial right lung base, increased in the interval. No other interval changes. IMPRESSION: New focal infiltrate in the medial right lung base. Recommend follow-up to resolution. Mild superimposed edema.  Electronically Signed   By: Dorise Bullion III M.D   On: 01/01/2016 12:55     STUDIES:    MICROBIOLOGY: MRSA PCR 12/3:  Positive Influenza PCR 12/3:  Negative  Blood Ctx x2 12/3 >> Urine Ctx 12/3:  Negative Tracheal Asp Ctx 12/3:  Pseudomonas aeruginosa & MSSA Urine Streptococcal Antigen 12/3:  Positive   ANTIBIOTICS: Vancomycin 12/3 - 12/5 Zosyn 12/3 - 12/6 Fortaz 12/6 >>  SIGNIFICANT EVENTS: 12/03 - admit to ICU  LINES/TUBES: OETT 12/3 - 12/3 Trach #6 Shiley(JY) 12/3 >> PEG >> Foley 12/3 >> PIV  ASSESSMENT / PLAN:  65 y.o. female w/ h/o viral encephalopathy in November 2016 that resulted in an expressive aphasia & prior respiratory failure with tracheostomy placement. Subsequently decannulated twice with last episode 4 weeks prior to admission. Presents with multifocal pneumonia and acute hypoxic respiratory failure. Patient successfully re-trached and currently on narrowed spectrum antibiotic coverage for her pneumonia. Respiratory status and ventilator support requirement continuing to improve. Eventually hope to transition to tracheostomy collar if she tolerates a pressure support wean.  1. Acute Hypoxic Respiratory Failure:  Continuing pressure support wean. No further decannulation at this time. Plan to ultimately transition to Tracheostomy Collar. 2. Pseudomonas, Streptococcus pneumoniae, & MSSA Multilobar Pneumonia:  Currently on Day # 5/14 of Fortaz. 3. Sepsis:  Secondary to pneumonia.  Remainder of care as per primary service.  Sonia Baller Ashok Cordia, M.D. [facility] Pulmonary & Critical Care Pager:  [phone] After 3pm or if no response, call [phone] 1:14 PM 01/01/16

## 2016-01-01 NOTE — Consult Note (Signed)
Neurology Consultation Reason for Consult: Seizures Referring Physician: Sherral Hammers, C  CC: Seizures  History is obtained from: MI, chart  HPI: Judy Spears is a 65 y.o. 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 Because of this altered mental status, an EEG was performed which captured a  Seizure. The tech noticed this and notified me and I instructed the nurse to give Ativan which greatly improved the EEG.  She apparently had been on Keppra at some point in the past because of GPEDs that were seen, however she has not been on this in some time   She apparently has been quite sedated since the Ativan was given.  ROS: Unable to obtain due to altered mental status.  Past Medical History:  Diagnosis Date  . Arthritis    lt great toe  . Complication of anesthesia    pt has had headaches post op-did advise to hydra well preop  . Hair loss    right-sided  . History of exercise stress test    a. ETT (12/15) with 12:00 exercise, no ST changes, occasional PACs.   . Hyperlipidemia   . Insomnia   . Migraine headache   . 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 Postmenopausal   . Premature atrial contractions    a. Holter (12/15) with 8% PACs, no atrial runs or atrial fibrillation.      Family History  Problem Relation Age of Onset  . Stroke Mother     paralyzed on right side/aphasia  . Hypertension Mother     had stroke after stopping BP meds  . Colon cancer Father   . Prostate cancer Father   . Heart attack Maternal Grandfather 56  . Stroke Maternal Uncle 68     Social History:  reports that she has never smoked. She has never used smokeless tobacco. She reports that she drinks alcohol. She reports that she does not use drugs.   Exam: Current vital signs: BP 116/82 (BP  Location: Right Arm)   Pulse 91   Temp 98.7 F (37.1 C) (Axillary)   Resp (!) 38   Ht 5\' 6"  (1.676 m)   Wt 53.7 kg (118 lb 6.2 oz)   LMP 12/01/2010   SpO2 97%   BMI 19.11 kg/m  Vital signs in last 24 hours: Temp:  [98.7 F (37.1 C)-100.2 F (37.9 C)] 98.7 F (37.1 C) (12/07 1641) Pulse Rate:  [82-101] 91 (12/07 1641) Resp:  [20-46] 38 (12/07 1641) BP: (116-171)/(72-104) 116/82 (12/07 1641) SpO2:  [91 %-99 %] 97 % (12/07 1641) FiO2 (%):  [40 %] 40 % (12/07 1550) Weight:  [53.7 kg (118 lb 6.2 oz)] 53.7 kg (118 lb 6.2 oz) (12/07 0500)   Physical Exam  Constitutional: Appears chronically ill Psych: Eyes are closed and she does not engage Eyes: No scleral injection HENT: Tracheostomy in place Head: Normocephalic.  Cardiovascular: Normal rate and regular rhythm.  Respiratory: Ventilated GI: Soft.  No distension.  Skin: WDI  Neuro: Mental Status: Patient does not open her eyes, she does not follow commands she does have some spontaneous movement Cranial Nerves: II: She does not blink to threat Pupils are equal, round, and reactive to light.   III,IV, VI: Eyes are conjugate VII: Face appears grossly symmetric Did not check cough  or gag Motor: She may have a very mild increase in tone bilaterally left arm slowly greater than right. She does withdrawal to noxious stimulation in all 4 extremities Sensory: As above Cerebellar: She does not perform   I have reviewed labs in epic and the results pertinent to this consultation are: BMP-unremarkable Glucose-129  Impression: 65 year old female with history of encephalitis presenting with seizure in the setting of pneumonia. Given her history, I'm not surprised that she has a lower seizure threshold, and coupling this with an inflammatory state I suspect this resulted in her seizure. She will need imaging, however currently she is undergoing continuous EEG given that the seizure recorded was either subtle or subclinical. I will  start her on Keppra, which she apparently has not been on for quite some time.   Recommendations: 1) Keppra 500 mg twice a day 2) continuous EEG for now 3) we will follow   Roland Rack, MD [facility] [phone]  If 7pm- 7am, please page neurology on call as listed in [place].

## 2016-01-01 NOTE — Progress Notes (Signed)
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 Rehabilitation in [place] with return to [place]. She has had trach removed twice the last 4 weekd ago by Dr. Lake Bells.  She presents to [facility] ED with increased resp distress, cxr cw multilobar pna. She was intubated by PCCM in ED and a new #6 Shiley trach was placed and she was transferred to ICU on full vent support.  SUBJECTIVE:  Calm on vent Failed PSV of 20 d/t rising rr and tachycardia   VITAL SIGNS: BP 133/88   Pulse 88   Temp 99.5 F (37.5 C) (Oral)   Resp (!) 23   Ht 5\' 6"  (1.676 m)   Wt 118 lb 6.2 oz (53.7 kg)   LMP 12/01/2010   SpO2 91%   BMI 19.11 kg/m   HEMODYNAMICS:    VENTILATOR SETTINGS: Vent Mode: PRVC FiO2 (%):  [40 %] 40 % Set Rate:  [14 bmp] 14 bmp Vt Set:  [420 mL] 420 mL PEEP:  [5 cmH20] 5 cmH20 Plateau Pressure:  [16 cmH20-20 cmH20] 19 cmH20  INTAKE / OUTPUT: I/O last 3 completed shifts: In: 2700 [I.V.:570; NG/GT:1680; IV Piggyback:450] Out: 2525 [Urine:2525]  PHYSICAL EXAMINATION: General:  WNWF with expressive aphasia  Neuro:  Left arm contracture, baseline expressive aphasia, decreased LOC c/w baseline, lt hip contracture. Lt arm contracture  HEENT:  Trach clean Cardiovascular: s1 s2  RRR Lungs: coarse scattered rhonchi. Equal chest rise  Abdomen: Peg in place Musculoskeletal:  New left hip contracture , check x ray Skin:  Hot and dry  LABS:  BMET  Recent Labs Lab 12/30/15 0442 12/30/15 1932 12/31/15 0236  NA 134* 136 138  K 2.9* 3.7 3.6  CL 97* 99* 96*  CO2 28 27 30   BUN 11 12 13   CREATININE 0.35* 0.38* 0.43*  GLUCOSE 133* 130* 120*    Electrolytes  Recent  Labs Lab 12/29/15 1756 12/30/15 0442 12/30/15 1657 12/30/15 1932 12/31/15 0236  CALCIUM  --  8.1*  --  8.3* 8.9  MG 1.7 1.8 2.5*  --   --   PHOS 3.5 3.5 3.7  --   --     CBC  Recent Labs Lab 12/28/15 1657 12/29/15 0214 12/30/15 0442  WBC 19.5* 18.5* 14.1*  HGB 13.5 11.5* 11.1*  HCT 39.5 34.6* 33.0*  PLT 268 281 317    Coag's  Recent Labs Lab 12/28/15 1657  APTT 29  INR 1.14    Sepsis Markers  Recent Labs Lab 12/28/15 1525 12/28/15 1628 12/28/15 1657 12/28/15 1928  LATICACIDVEN 2.02* 2.6*  --  2.2*  PROCALCITON  --   --  0.97  --     ABG  Recent Labs Lab 12/28/15 1605 12/29/15 0345 12/30/15 0421  PHART 7.441 7.507* 7.484*  PCO2ART 42.6 34.7 38.2  PO2ART 60.9* 76.2* 85.2    Liver Enzymes  Recent Labs Lab 12/28/15 1115 12/28/15 1657  AST 69* 83*  ALT 129* 122*  ALKPHOS 131* 109  BILITOT 1.0 1.0  ALBUMIN 2.8* 2.5*    Cardiac Enzymes No results for input(s): TROPONINI, PROBNP in the last 168 hours.  Glucose  Recent Labs Lab 12/31/15 1228 12/31/15 1658 12/31/15 1948 01/01/16 0032 01/01/16 [id number]  01/01/16 0756  GLUCAP 155* 146* 144* 148* 154* 148*    Imaging No results found.   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 Rehabilitation in [place] with return to [place]. She has had trach removed twice the last 4 weeks ago by Dr. Lake Bells.  She presents to [facility] ED with increased resp distress, cxr cw multilobar pna. She was intubated by PCCM in ED and a new #6 Shiley trach was placed and she was transferred to ICU on full vent support. Sputum c/w  MSSA and pseudomonas. She is on appropriate abx. From a pulmonary standpoint she appears to be doing better. Placed her on PSV of 16 and she was comfortable. Todays issue is her MS. She is much less aware and interactive than baseline. I am concerned about subclinical seizure. Although certainly this could be just residual encephalopathy from being critically ill. Have spoke w/ primary team. EEG ordered. Will cont supportive care but hold off on weaning efforts until BP issues improved.   ASSESSMENT / PLAN:   Acute resp decompensation with multilobar pna with hx of trach and open stoma. Multilobar Pna-->prelim w/ few SA (MSSA) and pseudomonas (both pansens) Plan   -Cont cefepime for now (changed today 12/7) -Cont daily weaning efforts Once MS has improved  -will probably leave trach indef at this point.   AMS.  Has been in [place] since her injury. Husband reports less awake and responsive today.  Plan EEG  HTN Plan Defer to primary team   Hyperglycemia  P:   SSI    Hx of Viral Encephalopathy 11/2014 with contracture of left arm and recently left leg. Expressive aphasia.  Worsening mental status 12/3 requiring intubation Plan  RASS goal: 0 limit sedation, fent drop dose PT consulted  FAMILY  - Updates: to husband by me  - Inter-disciplinary family meet or Palliative Care meeting due by:  day [place] ACNP-BC [place] Pager # [phone] OR # [phone] if no answer

## 2016-01-01 NOTE — Progress Notes (Signed)
Physical Therapy Treatment Patient Details Name: Judy Spears MRN: [id number] DOB: [birth year] Today's Date: 01/01/2016    History of Present Illness Pt is a 65 y/o female with PMHx of viral encephalopathy 12/16 with resultant LUE contracture, expressive aphasia and trach with trach removed 2x since then last time 4 weeks ago, pt also with new left hip contracture of 2 months. Pt admitted due to CAP with respiratory failure. Trach and vent 12/3    PT Comments    Pt presented supine in bed with HOB elevated, awake; however, with a blank stare throughout session. Pt's spouse was present throughout session as well. Pt tolerated sitting EOB x10 minutes with total A at trunk and max A at bilateral LEs. At end of session, pt positioned in supine with HOB elevated and pillows to position L LE into a more neutral hip abduction/adduction as pt prefers to maintain L hip in flexion, ER and abduction. Pt would continue to benefit from skilled physical therapy services at this time while admitted and after d/c to address her limitations in order to improve her overall safety and independence with functional mobility.  Pt's HR and SPO2 maintained WNL. Her RR increased to 45 breaths/min initially when sitting EOB but was able to decrease to 32 breaths/min with VC'ing to slow rate of breathing.   Follow Up Recommendations  LTACH;Supervision/Assistance - 24 hour     Equipment Recommendations  None recommended by PT    Recommendations for Other Services OT consult     Precautions / Restrictions Precautions Precautions: Fall Precaution Comments: vent, trach, peg tube Restrictions Weight Bearing Restrictions: No    Mobility  Bed Mobility Overal bed mobility: Needs Assistance Bed Mobility: Rolling;Sidelying to Sit;Sit to Supine Rolling: Total assist Sidelying to sit: Total assist;HOB elevated   Sit to supine: Total assist   General bed mobility comments: total A for all aspects  Transfers                    Ambulation/Gait                 Stairs            Wheelchair Mobility    Modified Rankin (Stroke Patients Only)       Balance Overall balance assessment: Needs assistance Sitting-balance support: Feet supported;Single extremity supported Sitting balance-Leahy Scale: Zero Sitting balance - Comments: pt required max A at trunk to maintain sitting EOB for 10 minutes. Max A also required at bilateral LEs to calm her tremulous movements that improved when bilateral feet were on the floor. Pt's RR increased to 45 bpm but was able to decrease to 32 bpm with VC'ing to slow and control rate of breathing. Postural control: Posterior lean;Left lateral lean                          Cognition Arousal/Alertness: Awake/alert Behavior During Therapy: Flat affect Overall Cognitive Status: Impaired/Different from baseline Area of Impairment: Attention   Current Attention Level: Focused           General Comments: Pt with flat affect and blank stare throughout session. Pt not following any commands this session.    Exercises      General Comments        Pertinent Vitals/Pain Pain Assessment: Faces Faces Pain Scale: No hurt Pain Intervention(s): Monitored during session    Home Living  Prior Function            PT Goals (current goals can now be found in the care plan section) Acute Rehab PT Goals Patient Stated Goal: return home PT Goal Formulation: With family Time For Goal Achievement: 01/13/16 Potential to Achieve Goals: Fair Progress towards PT goals: Progressing toward goals    Frequency    Min 3X/week      PT Plan Current plan remains appropriate    Co-evaluation             End of Session   Activity Tolerance: Patient tolerated treatment well Patient left: in bed;with call bell/phone within reach;with family/visitor present     Time: 0935-1005 PT Time Calculation (min)  (ACUTE ONLY): 30 min  Charges:  $Therapeutic Activity: 23-37 mins                    G CodesClearnce Sorrel Micaella Gitto Jan 25, 2016, 11:02 AM Sherie Don, [place], DPT [phone]

## 2016-01-01 NOTE — Progress Notes (Signed)
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 Rehabilitation in [place] with return to [place]. She has had trach removed twice the last 4 weekd ago by Dr. Kendrick Fries.  She presents to [facility] ED with increased resp distress, cxr cw multilobar pna. She was intubated by PCCM in ED and a new #6 Shiley trach was placed and she was transferred to ICU on full vent support.   Subjective: 12/7 patient's eyes are open not focusing/tracking persons or objects. Rolling motion of her head from side to side. Does respond to painful stimuli. 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 At baseline able to follow commands, nods yes and no to questions and verbalize yes/no to simple questions.    Assessment & Plan:   Active Problems:   HCAP (healthcare-associated pneumonia)   Respiratory failure (HCC)   Sepsis (HCC)   Hypoxia   Pain   Acute respiratory failure hypoxia -Secondary to multi lobar pneumonia positive MDR Pseudomonas? + MSSA -Patient has history of trach and open stoma.  -Unable to tolerate NIMVS. -Cont daily attempts to wean   -Change antibiotic to Cefepime in order to cover MSSA. Monitor closely as this lowers seizure threshold  Hyperglycemia  -Sensitive SSI -A1c pending     Acute Encephalopathy    -Hx of Viral Encephalopathy 11/2014 with contracture of left arm and recently left leg. Expressive aphasia.  -Worsening mental status 12/3 requiring intubation -EEG: Positive for seizure, conducting 24-hour EEG  -After completion of 24-hour EEG obtain MRI brain.  Seizure -Neurology believes brought on by patient's pneumonia. -Keppra 500 mg BID -Await further recommendations from neurology  Bilateral upper  extremity contracture -OT consult for arm/hand braces     DVT prophylaxis: Subcutaneous heparin Code Status: Full Family Communication: Family at bedside for discussion of plan of care Disposition Plan: SNF?   Consultants:  [facility]  Dr.McNeill Windy Fast Neurology   Procedures/Significant Events:  12/7 EEG: Per neurology consistent with seizure.   VENTILATOR SETTINGS: Vent mode: PRBC Vt Set: Set rate: 14 FiO2 40% PEEP: 5cmH2O   Cultures 12/3 blood 2 NGTD 12/3 urine negative 12/3 MRSA by PCR positive 12/3 tracheal aspirate positive MSSA + Pseudomonas   Antimicrobials: Cefepime 12/7>> Fortaz 12/6>> 12/7 Zosyn 12/3>> 12/6 Vancomycin 12/3>> 12/6   Devices    LINES / TUBES:  12/3 ett>>12/3 12/3 #6 Shiley(JY)>>    Continuous Infusions: . sodium chloride 10 mL/hr at 12/30/15 2000  . sodium chloride 10 mL/hr at 12/30/15 2000  . sodium chloride Stopped (12/30/15 0900)  . feeding supplement (VITAL AF 1.2 CAL) 1,000 mL (12/31/15 1825)     Objective: Vitals:   01/01/16 0744 01/01/16 0758 01/01/16 0900 01/01/16 0934  BP: (!) 161/100 (!) 171/104 133/88   Pulse: 87 (!) 101 88   Resp: (!) 22 (!) 35 (!) 23   Temp:  100.2 F (37.9 C)  99.5 F (37.5 C)  TempSrc:  Oral  Oral  SpO2: 94% 92% 91%   Weight:      Height:        Intake/Output Summary (Last 24 hours) at 01/01/16 1130 Last data filed at 01/01/16 0800  Gross per 24 hour  Intake             1960 ml  Output              750 ml  Net             1210 ml   Filed Weights   12/30/15 0500 12/31/15 0404 01/01/16 0500  Weight: 55.6 kg (122 lb 9.2 oz) 57.4 kg (126 lb 8.7 oz) 53.7 kg (118 lb 6.2 oz)    Examination:  General: eyes are open not focusing/tracking persons or objects. Rolling motion of her head from side to side. Does respond to painful stimuli., Cachectic, Positive acute respiratory distress Eyes: negative scleral hemorrhage, negative anisocoria, negative icterus ENT: Negative  Runny nose, negative gingival bleeding, Neck:  Negative scars, masses, torticollis, lymphadenopathy, JVD Lungs: Clear to auscultation bilaterally without wheezes or crackles Cardiovascular: Regular rate and rhythm without murmur gallop or rub normal S1 and S2 Abdomen: negative abdominal pain, nondistended, positive soft, bowel sounds, no rebound, no ascites, no appreciable mass Extremities: No significant cyanosis, clubbing, or edema bilateral lower extremities Skin: Negative rashes, lesions, ulcers Psychiatric:  Unable to evaluate Central nervous system:  Unable to evaluate: Withdraws to painful stimuli  .     Data Reviewed: Care during the described time interval was provided by me .  I have reviewed this patient's available data, including medical history, events of note, physical examination, and all test results as part of my evaluation. I have personally reviewed and interpreted all radiology studies.  CBC:  Recent Labs Lab 12/28/15 1115 12/28/15 1657 12/29/15 0214 12/30/15 0442  WBC 18.7* 19.5* 18.5* 14.1*  NEUTROABS 16.8*  --   --  11.6*  HGB 15.1* 13.5 11.5* 11.1*  HCT 43.7 39.5 34.6* 33.0*  MCV 86.0 85.3 86.1 85.3  PLT 319 268 281 767   Basic Metabolic Panel:  Recent Labs Lab 12/28/15 1657 12/29/15 0214 12/29/15 1229 12/29/15 1756 12/30/15 0442 12/30/15 1657 12/30/15 1932 12/31/15 0236  NA 130* 131*  --   --  134*  --  136 138  K 3.3* 3.4*  --   --  2.9*  --  3.7 3.6  CL 93* 97*  --   --  97*  --  99* 96*  CO2 27 25  --   --  28  --  27 30  GLUCOSE 134* 96  --   --  133*  --  130* 120*  BUN 10 13  --   --  11  --  12 13  CREATININE 0.40* 0.42*  --   --  0.35*  --  0.38* 0.43*  CALCIUM 8.9 8.4*  --   --  8.1*  --  8.3* 8.9  MG 1.7  --  1.7 1.7 1.8 2.5*  --   --   PHOS 2.7  --  3.4 3.5 3.5 3.7  --   --    GFR: Estimated Creatinine Clearance: 60.2 mL/min (by C-G formula based on SCr of 0.43 mg/dL (L)). Liver Function Tests:  Recent Labs Lab  12/28/15 1115 12/28/15 1657  AST 69* 83*  ALT 129* 122*  ALKPHOS 131* 109  BILITOT 1.0 1.0  PROT 7.2 6.3*  ALBUMIN 2.8* 2.5*   No results for input(s): LIPASE, AMYLASE in the last 168 hours. No results for input(s): AMMONIA in the last 168 hours. Coagulation Profile:  Recent Labs Lab 12/28/15 1657  INR 1.14   Cardiac Enzymes: No results for input(s): CKTOTAL, CKMB, CKMBINDEX, TROPONINI in the last 168 hours. BNP (last 3 results) No results for input(s): PROBNP in the last  8760 hours. HbA1C: No results for input(s): HGBA1C in the last 72 hours. CBG:  Recent Labs Lab 12/31/15 1658 12/31/15 1948 01/01/16 0032 01/01/16 0521 01/01/16 0756  GLUCAP 146* 144* 148* 154* 148*   Lipid Profile: No results for input(s): CHOL, HDL, LDLCALC, TRIG, CHOLHDL, LDLDIRECT in the last 72 hours. Thyroid Function Tests: No results for input(s): TSH, T4TOTAL, FREET4, T3FREE, THYROIDAB in the last 72 hours. Anemia Panel: No results for input(s): VITAMINB12, FOLATE, FERRITIN, TIBC, IRON, RETICCTPCT in the last 72 hours. Urine analysis:    Component Value Date/Time   COLORURINE YELLOW 12/28/2015 1205   [place] 12/28/2015 1205   LABSPEC 1.025 12/28/2015 1205   PHURINE 7.0 12/28/2015 1205   GLUCOSEU 500 (A) 12/28/2015 1205   HGBUR SMALL (A) 12/28/2015 1205   BILIRUBINUR NEGATIVE 12/28/2015 1205   KETONESUR NEGATIVE 12/28/2015 1205   PROTEINUR 100 (A) 12/28/2015 1205   NITRITE NEGATIVE 12/28/2015 1205   LEUKOCYTESUR NEGATIVE 12/28/2015 1205   Sepsis Labs: '@LABRCNTIP'$ (procalcitonin:4,lacticidven:4)  ) Recent Results (from the past 240 hour(s))  Blood Culture (routine x 2)     Status: None (Preliminary result)   Collection Time: 12/28/15 11:15 AM  Result Value Ref Range Status   Specimen Description BLOOD LEFT HAND  Final   Special Requests IN PEDIATRIC BOTTLE 3CC  Final   Culture NO GROWTH 4 DAYS  Final   Report Status PENDING  Incomplete  Blood Culture (routine x 2)      Status: None (Preliminary result)   Collection Time: 12/28/15 11:15 AM  Result Value Ref Range Status   Specimen Description BLOOD RIGHT ANTECUBITAL  Final   Special Requests BOTTLES DRAWN AEROBIC AND ANAEROBIC 5CC  Final   Culture NO GROWTH 4 DAYS  Final   Report Status PENDING  Incomplete  Urine culture     Status: None   Collection Time: 12/28/15 12:05 PM  Result Value Ref Range Status   Specimen Description URINE, RANDOM  Final   Special Requests NONE  Final   Culture NO GROWTH  Final   Report Status 12/29/2015 FINAL  Final  Pneumocystis smear by DFA     Status: None   Collection Time: 12/28/15  2:00 PM  Result Value Ref Range Status   Specimen Source-PJSRC TRACHEAL ASPIRATE  Final   Pneumocystis jiroveci Ag NEGATIVE  Final    Comment: Performed at [place] [facility]  MRSA PCR Screening     Status: Abnormal   Collection Time: 12/28/15  3:53 PM  Result Value Ref Range Status   MRSA by PCR POSITIVE (A) NEGATIVE Final    Comment:        The GeneXpert MRSA Assay (FDA approved for NASAL specimens only), is one component of a comprehensive MRSA colonization surveillance program. It is not intended to diagnose MRSA infection nor to guide or monitor treatment for MRSA infections. RESULT CALLED TO, READ BACK BY AND VERIFIED WITH: J.PEARIN,RN 12/28/15 '@1930'$  BY V.WILKINS   Culture, respiratory (NON-Expectorated)     Status: None   Collection Time: 12/28/15  4:10 PM  Result Value Ref Range Status   Specimen Description TRACHEAL ASPIRATE  Final   Special Requests Normal  Final   Gram Stain   Final    ABUNDANT WBC PRESENT,BOTH PMN AND MONONUCLEAR RARE GRAM POSITIVE COCCI IN PAIRS RARE GRAM POSITIVE RODS    Culture   Final    FEW STAPHYLOCOCCUS AUREUS FEW PSEUDOMONAS AERUGINOSA    Report Status 12/31/2015 FINAL  Final   Organism  ID, Bacteria STAPHYLOCOCCUS AUREUS  Final   Organism ID, Bacteria PSEUDOMONAS AERUGINOSA  Final      Susceptibility   Pseudomonas  aeruginosa - MIC*    CEFTAZIDIME 2 SENSITIVE Sensitive     CIPROFLOXACIN <=0.25 SENSITIVE Sensitive     GENTAMICIN <=1 SENSITIVE Sensitive     IMIPENEM 1 SENSITIVE Sensitive     PIP/TAZO 8 SENSITIVE Sensitive     CEFEPIME <=1 SENSITIVE Sensitive     * FEW PSEUDOMONAS AERUGINOSA   Staphylococcus aureus - MIC*    CIPROFLOXACIN <=0.5 SENSITIVE Sensitive     ERYTHROMYCIN >=8 RESISTANT Resistant     GENTAMICIN <=0.5 SENSITIVE Sensitive     OXACILLIN <=0.25 SENSITIVE Sensitive     TETRACYCLINE <=1 SENSITIVE Sensitive     VANCOMYCIN <=0.5 SENSITIVE Sensitive     TRIMETH/SULFA <=10 SENSITIVE Sensitive     CLINDAMYCIN <=0.25 RESISTANT Resistant     RIFAMPIN <=0.5 SENSITIVE Sensitive     Inducible Clindamycin POSITIVE Resistant     * FEW STAPHYLOCOCCUS AUREUS         Radiology Studies: No results found.      Scheduled Meds: . ceFEPime (MAXIPIME) IV  2 g Intravenous Q8H  . chlorhexidine gluconate (MEDLINE KIT)  15 mL Mouth Rinse BID  . Chlorhexidine Gluconate Cloth  6 each Topical Q0600  . heparin  5,000 Units Subcutaneous Q8H  . mouth rinse  15 mL Mouth Rinse QID  . mupirocin ointment  1 application Nasal BID  . pantoprazole sodium  40 mg Per Tube QHS   Continuous Infusions: . sodium chloride 10 mL/hr at 12/30/15 2000  . sodium chloride 10 mL/hr at 12/30/15 2000  . sodium chloride Stopped (12/30/15 0900)  . feeding supplement (VITAL AF 1.2 CAL) 1,000 mL (12/31/15 1825)     LOS: 4 days    Time spent: 40 minutes    Shirlee Whitmire, Geraldo Docker, MD [facility] Pager [phone]   If 7PM-7AM, please contact night-coverage [email or web address] Password [email or web address] 01/01/2016, 11:30 AM

## 2016-01-01 NOTE — Progress Notes (Signed)
Pharmacy Antibiotic Note Judy Spears is a 65 y.o. female admitted on 12/28/2015 with respiratory failure and found to have multilobar pna. Empirically started on Zosyn and vancomycin for treatment. Trach aspirate from 12/3 has grown MSSA and pseudomonas.  The patient was transitioned from Vancomycin + Zosyn >> Ceftazidime on 12/6 however it is noted that Ceftazidime does not cover MSSA well. This was discussed with Dr. Sherral Hammers today and will plan to transition the patient to Cefepime. SCr 0.43, CrCl~60 ml/min.   Plan: 1. D/c Ceftazidime 2. Start Cefepime 2g IV every 8 hours 3. Will continue to follow renal function, culture results, LOT, and antibiotic de-escalation plans    Height: 5\' 6"  (167.6 cm) Weight: 118 lb 6.2 oz (53.7 kg) IBW/kg (Calculated) : 59.3  Temp (24hrs), Avg:99.6 F (37.6 C), Min:99 F (37.2 C), Max:100.2 F (37.9 C)   Recent Labs Lab 12/28/15 1115 12/28/15 1127 12/28/15 1525 12/28/15 1628 12/28/15 1657 12/28/15 1928 12/29/15 0214 12/30/15 0442 12/30/15 1932 12/31/15 0236 12/31/15 1258  WBC 18.7*  --   --   --  19.5*  --  18.5* 14.1*  --   --   --   CREATININE 0.37*  --   --   --  0.40*  --  0.42* 0.35* 0.38* 0.43*  --   LATICACIDVEN  --  2.31* 2.02* 2.6*  --  2.2*  --   --   --   --   --   VANCOTROUGH  --   --   --   --   --   --   --   --   --   --  6*    Estimated Creatinine Clearance: 60.2 mL/min (by C-G formula based on SCr of 0.43 mg/dL (L)).    Allergies  Allergen Reactions  . Pollen Extract Other (See Comments)    Running nose  . Tetracyclines & Related     Blisters, photosensitivity   Antimicrobials this admission:  Vanc 12/3 >> 12/6 Zosyn 12/3 >> 12/6 Ceftazidime 12/6 >> 12/7 Cefepime 12/7 >>  Dose adjustments this admission:  VT 6 on 500 mg Q12H  Microbiology results:  12/3 UCx -  ngF 12/3 BCx x2 -ngtd 12/3 MRSA PCR (+) 12/3 Trach asp-  MSSA and pseudomonas pan S 12/3 influenza: negative 12/3 strep pneumo antigen: positive     Thank you for allowing pharmacy to be a part of this patient's care.  Alycia Rossetti, PharmD, BCPS Clinical Pharmacist Pager: [phone] Clinical phone for 01/01/2016 from 7a-3:30p: [phone] If after 3:30p, please call main pharmacy at: [phone] 01/01/2016 10:55 AM

## 2016-01-01 NOTE — Progress Notes (Signed)
STAT EEG ordered, changed to LTM per Dr Leonel Ramsay

## 2016-01-02 DIAGNOSIS — R569 Unspecified convulsions: Secondary | ICD-10-CM

## 2016-01-02 DIAGNOSIS — A408 Other streptococcal sepsis: Secondary | ICD-10-CM

## 2016-01-02 DIAGNOSIS — G40901 Epilepsy, unspecified, not intractable, with status epilepticus: Secondary | ICD-10-CM

## 2016-01-02 LAB — BASIC METABOLIC PANEL
ANION GAP: 12 (ref 5–15)
BUN: 26 mg/dL — ABNORMAL HIGH (ref 6–20)
CALCIUM: 9 mg/dL (ref 8.9–10.3)
CO2: 32 mmol/L (ref 22–32)
Chloride: 103 mmol/L (ref 101–111)
Creatinine, Ser: 0.4 mg/dL — ABNORMAL LOW (ref 0.44–1.00)
GFR calc non Af Amer: >60 mL/min (ref >60)
Glucose, Bld: 131 mg/dL — ABNORMAL HIGH (ref 65–99)
POTASSIUM: 3 mmol/L — AB (ref 3.5–5.1)
Sodium: 147 mmol/L — ABNORMAL HIGH (ref 135–145)

## 2016-01-02 LAB — CULTURE, BLOOD (ROUTINE X 2)
CULTURE: NO GROWTH
CULTURE: NO GROWTH

## 2016-01-02 LAB — GLUCOSE, CAPILLARY
Glucose-Capillary: 109 mg/dL — ABNORMAL HIGH (ref 65–99)
Glucose-Capillary: 129 mg/dL — ABNORMAL HIGH (ref 65–99)
Glucose-Capillary: 131 mg/dL — ABNORMAL HIGH (ref 65–99)
Glucose-Capillary: 131 mg/dL — ABNORMAL HIGH (ref 65–99)
Glucose-Capillary: 133 mg/dL — ABNORMAL HIGH (ref 65–99)
Glucose-Capillary: 137 mg/dL — ABNORMAL HIGH (ref 65–99)

## 2016-01-02 LAB — CBC
HEMATOCRIT: 37.5 % (ref 36.0–46.0)
Hemoglobin: 11.9 g/dL — ABNORMAL LOW (ref 12.0–15.0)
MCH: 28.3 pg (ref 26.0–34.0)
MCHC: 31.7 g/dL (ref 30.0–36.0)
MCV: 89.1 fL (ref 78.0–100.0)
Platelets: 409 10*3/uL — ABNORMAL HIGH (ref 150–400)
RBC: 4.21 MIL/uL (ref 3.87–5.11)
RDW: 15.4 % (ref 11.5–15.5)
WBC: 8.1 10*3/uL (ref 4.0–10.5)

## 2016-01-02 LAB — VALPROIC ACID LEVEL: VALPROIC ACID LVL: 49 ug/mL — AB (ref 50.0–100.0)

## 2016-01-02 LAB — MAGNESIUM: Magnesium: 2.1 mg/dL (ref 1.7–2.4)

## 2016-01-02 MED ORDER — LORAZEPAM 2 MG/ML IJ SOLN
INTRAMUSCULAR | Status: AC
  Administered 2016-01-02: 1 mg
  Filled 2016-01-02: qty 1

## 2016-01-02 MED ORDER — SODIUM CHLORIDE 0.9 % IV SOLN
1000.0000 mg | Freq: Two times a day (BID) | INTRAVENOUS | Status: DC
  Administered 2016-01-02: 1000 mg via INTRAVENOUS
  Filled 2016-01-02 (×2): qty 10

## 2016-01-02 MED ORDER — LORAZEPAM 2 MG/ML IJ SOLN
INTRAMUSCULAR | Status: AC
  Administered 2016-01-02: 2 mg
  Filled 2016-01-02: qty 1

## 2016-01-02 MED ORDER — POTASSIUM CHLORIDE 20 MEQ/15ML (10%) PO SOLN
20.0000 meq | Freq: Three times a day (TID) | ORAL | Status: DC
  Administered 2016-01-02 – 2016-01-03 (×3): 20 meq
  Filled 2016-01-02 (×4): qty 15

## 2016-01-02 MED ORDER — LORAZEPAM 2 MG/ML IJ SOLN: 2.0000 mg | INTRAMUSCULAR | Status: DC

## 2016-01-02 MED ORDER — FENTANYL CITRATE (PF) 100 MCG/2ML IJ SOLN: 25.0000 ug | INTRAMUSCULAR | Status: DC | PRN

## 2016-01-02 MED ORDER — SODIUM CHLORIDE 0.9 % IV SOLN
1500.0000 mg | Freq: Two times a day (BID) | INTRAVENOUS | Status: DC
  Administered 2016-01-02 – 2016-01-04 (×5): 1500 mg via INTRAVENOUS
  Filled 2016-01-02 (×8): qty 15

## 2016-01-02 MED ORDER — SODIUM CHLORIDE 0.9 % IV SOLN
200.0000 mg | Freq: Two times a day (BID) | INTRAVENOUS | Status: DC
  Administered 2016-01-02 – 2016-01-04 (×5): 200 mg via INTRAVENOUS
  Filled 2016-01-02 (×10): qty 20

## 2016-01-02 MED ORDER — VALPROATE SODIUM 500 MG/5ML IV SOLN
500.0000 mg | Freq: Three times a day (TID) | INTRAVENOUS | Status: DC
  Administered 2016-01-02: 500 mg via INTRAVENOUS
  Filled 2016-01-02 (×3): qty 5

## 2016-01-02 MED ORDER — LORAZEPAM 2 MG/ML IJ SOLN
INTRAMUSCULAR | Status: AC
  Administered 2016-01-02: 2 mg via INTRAVENOUS
  Filled 2016-01-02: qty 1

## 2016-01-02 MED ORDER — SODIUM CHLORIDE 0.9 % IV SOLN
200.0000 mg | Freq: Once | INTRAVENOUS | Status: AC
  Administered 2016-01-02: 200 mg via INTRAVENOUS
  Filled 2016-01-02: qty 20

## 2016-01-02 MED ORDER — LORAZEPAM 2 MG/ML IJ SOLN
INTRAMUSCULAR | Status: AC
  Filled 2016-01-02: qty 1

## 2016-01-02 MED ORDER — ACETAMINOPHEN 325 MG PO TABS
650.0000 mg | ORAL_TABLET | Freq: Four times a day (QID) | ORAL | Status: DC | PRN
  Administered 2016-01-02 (×2): 650 mg
  Filled 2016-01-02 (×2): qty 2

## 2016-01-02 MED ORDER — LORAZEPAM 2 MG/ML IJ SOLN
2.0000 mg | Freq: Once | INTRAMUSCULAR | Status: AC
  Administered 2016-01-02: 2 mg via INTRAVENOUS

## 2016-01-02 MED ORDER — FREE WATER
200.0000 mL | Status: DC
  Administered 2016-01-02 – 2016-01-04 (×12): 200 mL

## 2016-01-02 MED ORDER — VALPROATE SODIUM 500 MG/5ML IV SOLN
500.0000 mg | Freq: Four times a day (QID) | INTRAVENOUS | Status: DC
  Administered 2016-01-02 – 2016-01-03 (×4): 500 mg via INTRAVENOUS
  Filled 2016-01-02 (×5): qty 5

## 2016-01-02 MED ORDER — SODIUM CHLORIDE 0.9 % IV SOLN
500.0000 mg | Freq: Once | INTRAVENOUS | Status: AC
  Administered 2016-01-02: 500 mg via INTRAVENOUS
  Filled 2016-01-02: qty 5

## 2016-01-02 MED ORDER — VALPROATE SODIUM 500 MG/5ML IV SOLN
1000.0000 mg | Freq: Once | INTRAVENOUS | Status: AC
  Administered 2016-01-02: 1000 mg via INTRAVENOUS
  Filled 2016-01-02: qty 10

## 2016-01-02 MED ORDER — LORAZEPAM 2 MG/ML IJ SOLN
1.0000 mg | INTRAMUSCULAR | Status: DC | PRN
  Filled 2016-01-02 (×2): qty 1

## 2016-01-02 MED ORDER — SODIUM CHLORIDE 0.9 % IV SOLN
100.0000 mg | Freq: Two times a day (BID) | INTRAVENOUS | Status: DC
  Filled 2016-01-02: qty 10

## 2016-01-02 NOTE — Progress Notes (Signed)
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 Rehabilitation in [place] with return to [facility]. She has had trach removed twice the last 4 weekd ago by Dr. Lake Bells.  She presents to [facility] ED with increased resp distress, cxr cw multilobar pna. She was intubated by PCCM in ED and a new #6 Shiley trach was placed and she was transferred to ICU on full vent support.  SUBJECTIVE:  Currently on cont EEG. Seizures noted on EEG.   VITAL SIGNS: BP 110/72   Pulse 87   Temp (!) 100.7 F (38.2 C) (Axillary)   Resp (!) 31   Ht 5\' 6"  (1.676 m)   Wt 121 lb 7.6 oz (55.1 kg)   LMP 12/01/2010   SpO2 95%   BMI 19.61 kg/m   HEMODYNAMICS:    VENTILATOR SETTINGS: Vent Mode: CPAP;PSV FiO2 (%):  [40 %] 40 % Set Rate:  [14 bmp] 14 bmp Vt Set:  [420 mL] 420 mL PEEP:  [5 cmH20] 5 cmH20 Pressure Support:  [10 cmH20] 10 cmH20 Plateau Pressure:  [12 cmH20-18 cmH20] 12 cmH20  INTAKE / OUTPUT: I/O last 3 completed shifts: In: 2990 [P.O.:120; I.V.:350; NG/GT:2100; IV Piggyback:420] Out: 1450 [Urine:1450]  PHYSICAL EXAMINATION: General:  WNWF lethargic currently  Neuro:  Left arm contracture, baseline expressive aphasia, decreased LOC c/w baseline, lt hip contracture. Lt arm contracture. Less interactive currently  HEENT:  Trach clean Cardiovascular: s1 s2  RRR Lungs: coarse scattered rhonchi. Equal chest rise  Abdomen: Peg in place Musculoskeletal:  New left hip contracture , check x ray Skin:  Hot and dry  LABS:  BMET  Recent Labs Lab 12/30/15 1932 12/31/15 0236 01/02/16 0500  NA 136 138 147*  K 3.7 3.6 3.0*  CL 99* 96* 103  CO2 27 30 32  BUN 12 13 26*  CREATININE 0.38*  0.43* 0.40*  GLUCOSE 130* 120* 131*    Electrolytes  Recent Labs Lab 12/29/15 1756 12/30/15 0442 12/30/15 1657 12/30/15 1932 12/31/15 0236 01/02/16 0500  CALCIUM  --  8.1*  --  8.3* 8.9 9.0  MG 1.7 1.8 2.5*  --   --  2.1  PHOS 3.5 3.5 3.7  --   --   --     CBC  Recent Labs Lab 12/29/15 0214 12/30/15 0442 01/02/16 0500  WBC 18.5* 14.1* 8.1  HGB 11.5* 11.1* 11.9*  HCT 34.6* 33.0* 37.5  PLT 281 317 409*    Coag's  Recent Labs Lab 12/28/15 1657  APTT 29  INR 1.14    Sepsis Markers  Recent Labs Lab 12/28/15 1525 12/28/15 1628 12/28/15 1657 12/28/15 1928  LATICACIDVEN 2.02* 2.6*  --  2.2*  PROCALCITON  --   --  0.97  --     ABG  Recent Labs Lab 12/28/15 1605 12/29/15 0345 12/30/15 0421  PHART 7.441 7.507* 7.484*  PCO2ART 42.6 34.7 38.2  PO2ART 60.9* 76.2* 85.2    Liver Enzymes  Recent Labs Lab 12/28/15 1115 12/28/15 1657  AST 69* 83*  ALT 129* 122*  ALKPHOS 131* 109  BILITOT 1.0 1.0  ALBUMIN 2.8* 2.5*    Cardiac Enzymes No results for input(s): TROPONINI, PROBNP in the last 168 hours.  Glucose  Recent Labs Lab 01/01/16 1212 01/01/16 1622 01/01/16 2017 01/02/16 0015 01/02/16 0440 01/02/16 0751  GLUCAP 156* 129* 139* 133* 131* 131*    Imaging Dg Chest Port 1 View  Result Date: 01/01/2016 CLINICAL DATA:  Tracheostomy placed 4 days ago.  Fever. EXAM: PORTABLE CHEST 1 VIEW COMPARISON:  December 30, 2015 FINDINGS: A tracheostomy tube is in good position. No pneumothorax. Diffuse interstitial opacities suggest mild edema. More focal infiltrate is seen in the medial right lung base, increased in the interval. No other interval changes. IMPRESSION: New focal infiltrate in the medial right lung base. Recommend follow-up to resolution. Mild superimposed edema. Electronically Signed   By: Dorise Bullion III M.D   On: 01/01/2016 12:55  RML and right base airspace disease.    STUDIES:    CULTURES: 12/3 bc x 2>> 12/3 BAL:  pseudomonas and MSSA 12/3 UC>>neg   ANTIBIOTICS:  12/3 vanc>>12/5 12/3 Zoysn>>12/6 12/6 ceftaz>>12/7 12/7 cefepime>>>  SIGNIFICANT EVENTS: 12/3 admit to ICU 12/6 MS altered. Not focal. EEG c/w status. AEDs started. Neuro following.   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 Rehabilitation in [place] with return to [place]. She has had trach removed twice the last 4 weeks ago by Dr. Lake Bells.  She presents to [facility] ED with increased resp distress, cxr cw multilobar pna. She was intubated by PCCM in ED and a new #6 Shiley trach was placed and she was transferred to ICU on full vent support. Sputum c/w MSSA and pseudomonas. She is on appropriate abx. From a pulmonary standpoint she appears to be doing better. Biggest issue right now is seizure. Will hold off from weaning until seizures resolved.   ASSESSMENT / PLAN: Hx of Viral Encephalopathy 11/2014 with contracture of left arm and recently left leg. Expressive aphasia.  Seizure/status (new dx 12/7) Plan  Cont EEG and AEDs per neuro Cont supportive care   Acute resp decompensation with multilobar pna with hx of trach and open stoma. Multilobar Pna-->prelim w/ few SA (MSSA) and pseudomonas (both pansens) Plan   -Cont cefepime for now (changed 12/7) -resume weaning efforts Once MS has improved  -will probably leave trach indef at this point.    HTN Plan Defer to primary team   Hypernatremia and hypokalemia  Plan Per primary svc  Hyperglycemia  P:   SSI      FAMILY  - Updates: to husband by me  - Inter-disciplinary family meet or Palliative Care meeting due by:  day [place] ACNP-BC [place] Pager # [phone] OR # [phone] if no answer

## 2016-01-02 NOTE — Evaluation (Signed)
Occupational Therapy Evaluation Patient Details Name: Judy Spears MRN: [id number] DOB: [birth year] Today's Date: 01/02/2016    History of Present Illness Pt is a 65 y/o female with PMHx of viral encephalopathy 12/16 with resultant LUE contracture, expressive aphasia and trach with trach removed 2x since then last time 4 weeks ago, pt also with new left hip contracture of 2 months. Pt admitted due to CAP with respiratory failure. Trach and vent 12/3   Clinical Impression   Pt is at total A level with All ADLs and mobility and unable to follow commands at this time. Pt demonstrates decreased ROM of L knee and elbow with slight foot drop of L LE beginning. Pt's family very familiar with therapy/rehab. Reviewed ROM with pt's family and potential risk of contractures and skin breakdown. Will begin process for L UE and LE orthotics/psotioning devices for pt. Pt would benefit from acute OT services at this time to maximize level of function, postioning and caregiver education     Follow Up Recommendations  LTACH;Supervision/Assistance - 24 hour    Equipment Recommendations  Other (comment) (L UE and LE orhtotics/postioning devices)    Recommendations for Other Services       Precautions / Restrictions Precautions Precautions: Fall Precaution Comments: vent, trach, peg tube Restrictions Weight Bearing Restrictions: No      Mobility Bed Mobility               General bed mobility comments: total A for all aspects  Transfers                 General transfer comment: unable at this time    Balance       Sitting balance - Comments: pt not following any commands                                    ADL Overall ADL's : Needs assistance/impaired                                       General ADL Comments: PTA, pt required max A with ADLs per family, pt now total A with ADLs                     Pertinent Vitals/Pain Pain  Assessment: No/denies pain     Hand Dominance     Extremity/Trunk Assessment Upper Extremity Assessment Upper Extremity Assessment:  (L LE ROM/positioning impaired. Tightness at knee/flexed) LUE Deficits / Details: left UE with baseline elbow flexion and internal shoulder rotation           Communication Communication Communication: Tracheostomy   Cognition Arousal/Alertness: Awake/alert Behavior During Therapy: Flat affect Overall Cognitive Status: Difficult to assess Area of Impairment: Attention               General Comments: Pt with flat affect and blank stare throughout session. Pt not following any commands this session.   General Comments       Exercises   Other Exercises Other Exercises: gentle PROM/stretch L Knee and L elbow Other Exercises: Pt's Family states that an splint/orthotic for L elbow is pending, however pt's RN not aware of any         Home Living Family/patient expects to be discharged to:: Private residence Living Arrangements: Spouse/significant other Available Help at Discharge:  Family;Personal care attendant;Available 24 hours/day Type of Home: House       Home Layout: One level     Bathroom Shower/Tub: Tub/shower unit         Home Equipment: Hospital bed;Wheelchair - manual;Tub bench;Hand held shower head          Prior Functioning/Environment Level of Independence: Needs assistance  Gait / Transfers Assistance Needed: max assist +1 for bed mobility. max assist sit to stand with stedy. Patient was doing stand pivot until LLE weakness onset 2 months ago. Can do standing pivot max assist to wheelchair.  ADL's / Homemaking Assistance Needed: max assist with bathing, dressing.             OT Problem List: Decreased activity tolerance;Impaired UE functional use;Impaired tone;Decreased range of motion;Decreased cognition   OT Treatment/Interventions: Neuromuscular education;Manual therapy;Splinting;Patient/family  education;Other (comment) (postioning)    OT Goals(Current goals can be found in the care plan section) Acute Rehab OT Goals Patient Stated Goal: return home OT Goal Formulation: With patient/family Time For Goal Achievement: 01/09/16 Potential to Achieve Goals: Good ADL Goals Additional ADL Goal #1: Will tolerate sitting EOB with min A for support x 10 minutes Additional ADL Goal #2: Pt will tolerate ROM to L UEs/LEs to decrease risk of contracture and skin breakdown Additional ADL Goal #3: Pt will tolerate L UE and LE orthotics/positioning devices to decrease risk of contracture and skin breakdown  OT Frequency: Min 2X/week   Barriers to D/C: Decreased caregiver support                        End of Session Nurse Communication: Other (comment) (L UE and L UE orthotic/positioning device needs)  Activity Tolerance: Patient limited by lethargy;Other (comment) (falt affect, blank stare, not following commands) Patient left: in bed;with call bell/phone within reach;with family/visitor present;Other (comment) (PA)   Time:  -    Charges:  OT General Charges $OT Visit: 1 Procedure OT Evaluation $OT Eval Moderate Complexity: 1 Procedure OT Treatments $Neuromuscular Re-education: 8-22 mins G-Codes:    Britt Bottom 01/02/2016, 12:34 PM

## 2016-01-02 NOTE — Progress Notes (Signed)
Subjective: Continued to have seizures, improved overnight, but again worsening this a.m.  Exam: Vitals:   01/02/16 1557 01/02/16 1600  BP: 124/86 112/73  Pulse: 82 82  Resp: 19 19  Temp:  100.3 F (37.9 C)   Gen: In bed, Tracheostomy Abd: soft, nt  Neuro: MS: Responds to noxious stimuli, but does not fixate or track or follow commands CN: Physical round and reactive to light, does not blink to threat Motor: She withdraws all extremities to noxious stimulation Sensory: As above  During my exam, she had subtle clonic activity of the right arm and shoulder accompanied by discharge on EEG.  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 She had not had known seizures between the time of her discharge and her current hospitalization.  Recommendations: 1) continue Keppra at 1500 mg twice a day 2) Vimpat started with 200 mg load, still recurrent seizures and so maintenance dose was left at 200 mg twice a day 3) Depakote, increase dose to 500 every 6 hours 4) Depakote level tomorrow 5) Ativan for seizures that are not stopping spontaneously  Roland Rack, MD [facility] [phone]  If 7pm- 7am, please page neurology on call as listed in [place].

## 2016-01-02 NOTE — Progress Notes (Signed)
EEG maintenance, electrodes all under 5k ohms, no skin breakdown at FP1, FP2.

## 2016-01-02 NOTE — Progress Notes (Signed)
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    She presented to [facility] ED with increased resp distress and a CXR suggestive of multilobar PNA. She was intubated by PCCM in the ED and a new #6 Shiley trach was placed.  She was transferred to ICU on full vent support.  Significant Events: 12/3 admit to ICU under PCCM - intubated in ED - #6 Shiley placed by [facility]  12/6 MS altered. Not focal. EEG c/w status. AEDs started. Neuro following.  12/7 TRH assumed attending role - PCCM following  Subjective: The pt is non-communicative.  She will intermittently contract her L arm at the elbow, but does not open her eyes or attempt to interact during my exam.    Assessment & Plan:  Acute hypoxic respiratory failure - Pseudomonas and MSSA multilobar HCAP ongoing respiratory care per PCCM - plan to stop abx after 7 day course as evidence of active infection has waned and abx could be contributing to seizure activity   Hyperglycemia  A1c pending - CBG well controlled at present   Acute Encephalopathy  -Viral Encephalopathy 11/2014 with contracture of left arm, left leg, expressive aphasia  -had reportedly made signif improvements w/ ongoing rehab effors, but then developed worsening mental status 12/3 requiring intubation -appears to be due to combination of acute infection (PNA) as well as seizure activity   Hypernatremia Increase free water via PEG tube   Hypokalemia  Replace and follow - Mg is normal   New onset Seizure activity / Status  Medication titration per Neurology who is actively following   Bilateral upper extremity contractures OT consult for arm/hand braces  MRSA screen +  DVT prophylaxis: SQ heparin  Code Status: FULL  CODE Family Communication: spoke w/ close family friend at bedside  Disposition Plan: SDU   Consultants:  PCCM Neurology   Antimicrobials:  Cefepime 12/7 > Fortaz 12/6 > 12/7 Zosyn 12/3 > 12/6 Vancomycin 12/3 > 12/6  Objective: Blood pressure 129/80, pulse 85, temperature 99 F (37.2 C), temperature source Oral, resp. rate (!) 22, height '5\' 6"'$  (1.676 m), weight 55.1 kg (121 lb 7.6 oz), last menstrual period 12/01/2010, SpO2 95 %.  Intake/Output Summary (Last 24 hours) at 01/02/16 1353 Last data filed at 01/02/16 1300  Gross per 24 hour  Intake             2800 ml  Output             1250 ml  Net             1550 ml   Filed Weights   12/31/15 0404 01/01/16 0500 01/02/16 0433  Weight: 57.4 kg (126 lb 8.7 oz) 53.7 kg (118 lb 6.2 oz) 55.1 kg (121 lb 7.6 oz)    Examination: General: No acute respiratory distress evident on vent via trach  Lungs: Clear to auscultation bilaterally without wheezes or crackles Cardiovascular: Regular rate and rhythm without murmur gallop or rub normal S1 and S2 Abdomen: Nontender, nondistended, soft, bowel sounds positive, no rebound, no ascites, no appreciable mass - PEG insertion clean and dry  Extremities: No significant cyanosis, clubbing, or edema bilateral lower extremities  CBC:  Recent Labs Lab 12/28/15 1115 12/28/15 1657 12/29/15 0214 12/30/15 0442 01/02/16 0500  WBC 18.7* 19.5*  18.5* 14.1* 8.1  NEUTROABS 16.8*  --   --  11.6*  --   HGB 15.1* 13.5 11.5* 11.1* 11.9*  HCT 43.7 39.5 34.6* 33.0* 37.5  MCV 86.0 85.3 86.1 85.3 89.1  PLT 319 268 281 317 858*   Basic Metabolic Panel:  Recent Labs Lab 12/28/15 1657 12/29/15 0214 12/29/15 1229 12/29/15 1756 12/30/15 0442 12/30/15 1657 12/30/15 1932 12/31/15 0236 01/02/16 0500  NA 130* 131*  --   --  134*  --  136 138 147*  K 3.3* 3.4*  --   --  2.9*  --  3.7 3.6 3.0*  CL 93* 97*  --   --  97*  --  99* 96* 103  CO2 27 25  --   --  28  --  27 30 32  GLUCOSE 134* 96  --   --   133*  --  130* 120* 131*  BUN 10 13  --   --  11  --  12 13 26*  CREATININE 0.40* 0.42*  --   --  0.35*  --  0.38* 0.43* 0.40*  CALCIUM 8.9 8.4*  --   --  8.1*  --  8.3* 8.9 9.0  MG 1.7  --  1.7 1.7 1.8 2.5*  --   --  2.1  PHOS 2.7  --  3.4 3.5 3.5 3.7  --   --   --    GFR: Estimated Creatinine Clearance: 61.8 mL/min (by C-G formula based on SCr of 0.4 mg/dL (L)).  Liver Function Tests:  Recent Labs Lab 12/28/15 1115 12/28/15 1657  AST 69* 83*  ALT 129* 122*  ALKPHOS 131* 109  BILITOT 1.0 1.0  PROT 7.2 6.3*  ALBUMIN 2.8* 2.5*    Coagulation Profile:  Recent Labs Lab 12/28/15 1657  INR 1.14    HbA1C: No results found for: HGBA1C  CBG:  Recent Labs Lab 01/01/16 1622 01/01/16 2017 01/02/16 0015 01/02/16 0440 01/02/16 0751  GLUCAP 129* 139* 133* 131* 131*    Recent Results (from the past 240 hour(s))  Blood Culture (routine x 2)     Status: None   Collection Time: 12/28/15 11:15 AM  Result Value Ref Range Status   Specimen Description BLOOD LEFT HAND  Final   Special Requests IN PEDIATRIC BOTTLE 3CC  Final   Culture NO GROWTH 5 DAYS  Final   Report Status 01/02/2016 FINAL  Final  Blood Culture (routine x 2)     Status: None   Collection Time: 12/28/15 11:15 AM  Result Value Ref Range Status   Specimen Description BLOOD RIGHT ANTECUBITAL  Final   Special Requests BOTTLES DRAWN AEROBIC AND ANAEROBIC 5CC  Final   Culture NO GROWTH 5 DAYS  Final   Report Status 01/02/2016 FINAL  Final  Urine culture     Status: None   Collection Time: 12/28/15 12:05 PM  Result Value Ref Range Status   Specimen Description URINE, RANDOM  Final   Special Requests NONE  Final   Culture NO GROWTH  Final   Report Status 12/29/2015 FINAL  Final  Pneumocystis smear by DFA     Status: None   Collection Time: 12/28/15  2:00 PM  Result Value Ref Range Status   Specimen Source-PJSRC TRACHEAL ASPIRATE  Final   Pneumocystis jiroveci Ag NEGATIVE  Final    Comment: Performed at [facility]  MRSA PCR Screening     Status: Abnormal   Collection Time: 12/28/15  3:53 PM  Result Value Ref Range Status   MRSA by PCR POSITIVE (A) NEGATIVE Final    Comment:        The GeneXpert MRSA Assay (FDA approved for NASAL specimens only), is one component of a comprehensive MRSA colonization surveillance program. It is not intended to diagnose MRSA infection nor to guide or monitor treatment for MRSA infections. RESULT CALLED TO, READ BACK BY AND VERIFIED WITH: J.PEARIN,RN 12/28/15 _0  BY V.WILKINS   Culture, respiratory (NON-Expectorated)     Status: None   Collection Time: 12/28/15  4:10 PM  Result Value Ref Range Status   Specimen Description TRACHEAL ASPIRATE  Final   Special Requests Normal  Final   Gram Stain   Final    ABUNDANT WBC PRESENT,BOTH PMN AND MONONUCLEAR RARE GRAM POSITIVE COCCI IN PAIRS RARE GRAM POSITIVE RODS    Culture   Final    FEW STAPHYLOCOCCUS AUREUS FEW PSEUDOMONAS AERUGINOSA    Report Status 12/31/2015 FINAL  Final   Organism ID, Bacteria STAPHYLOCOCCUS AUREUS  Final   Organism ID, Bacteria PSEUDOMONAS AERUGINOSA  Final      Susceptibility   Pseudomonas aeruginosa - MIC*    CEFTAZIDIME 2 SENSITIVE Sensitive     CIPROFLOXACIN <=0.25 SENSITIVE Sensitive     GENTAMICIN <=1 SENSITIVE Sensitive     IMIPENEM 1 SENSITIVE Sensitive     PIP/TAZO 8 SENSITIVE Sensitive     CEFEPIME <=1 SENSITIVE Sensitive     * FEW PSEUDOMONAS AERUGINOSA   Staphylococcus aureus - MIC*    CIPROFLOXACIN <=0.5 SENSITIVE Sensitive     ERYTHROMYCIN >=8 RESISTANT Resistant     GENTAMICIN <=0.5 SENSITIVE Sensitive     OXACILLIN <=0.25 SENSITIVE Sensitive     TETRACYCLINE <=1 SENSITIVE Sensitive     VANCOMYCIN <=0.5 SENSITIVE Sensitive     TRIMETH/SULFA <=10 SENSITIVE Sensitive     CLINDAMYCIN <=0.25 RESISTANT Resistant     RIFAMPIN <=0.5 SENSITIVE Sensitive     Inducible Clindamycin POSITIVE Resistant     * FEW STAPHYLOCOCCUS AUREUS      Scheduled Meds: . ceFEPime (MAXIPIME) IV  2 g Intravenous Q8H  . chlorhexidine gluconate (MEDLINE KIT)  15 mL Mouth Rinse BID  . Chlorhexidine Gluconate Cloth  6 each Topical Q0600  . heparin  5,000 Units Subcutaneous Q8H  . insulin aspart  0-9 Units Subcutaneous Q4H  . lacosamide (VIMPAT) IV  100 mg Intravenous Q12H  . levETIRAcetam  1,500 mg Intravenous BID  . mouth rinse  15 mL Mouth Rinse QID  . mupirocin ointment  1 application Nasal BID  . pantoprazole sodium  40 mg Per Tube QHS  . valproate sodium  500 mg Intravenous Q6H   Continuous Infusions: . sodium chloride 10 mL/hr at 12/30/15 2000  . sodium chloride 10 mL/hr at 12/30/15 2000  . sodium chloride Stopped (12/30/15 0900)  . feeding supplement (VITAL AF 1.2 CAL) 1,000 mL (01/02/16 0625)     LOS: 5 days   Cherene Altes, MD [facility] Office  [phone] Pager - Text Page per Amion as per below:  On-Call/Text Page:      [email or web address]      password [email or web address]  If 7PM-7AM, please contact night-coverage [email or web address] Password [email or web address] 01/02/2016, 1:53 PM

## 2016-01-02 NOTE — Progress Notes (Signed)
S/O: Notified by Dr. Doree Albee of recrudescence of electrographic seizures on LTM EEG.   BP (!) 138/97 (BP Location: Right Arm)   Pulse 97   Temp 99.4 F (37.4 C) (Oral)   Resp (!) 33   Ht 5\' 6"  (1.676 m)   Wt 53.7 kg (118 lb 6.2 oz)   LMP 12/01/2010   SpO2 93%   BMI 19.11 kg/m   Exam: No verbal responses. Occasional head turing to right appears stereotyped. Occasional foot twitching noted.   Bedside EEG shows generalized rhythmic activity, consistent with electrographic seizure.   A/R:  1. Seizures. EEG findings currently most consistent with new onset of nonconvulsive status epilepticus. Keppra load today was initially effective, but seizure prevention not sustained, with seizure activity on EEG recurring after a relatively short period of time.  2. Electrographic seizure activity stopped with 2 mg IV Ativan.  3. Valproic acid is being loaded at 20 mg/kg. Scheduled dose of 500 mg IV TID has also been ordered.  4. Will reassess EEG.   30 minutes of critical care time spent in the evaluation and management of this patient.   Electronically signed: Dr. Kerney Elbe

## 2016-01-02 NOTE — Procedures (Signed)
LTM-EEG Report  HISTORY: Continuous video-EEG monitoring performed for 65 year old with history of encephalitis, presenting with altered mental status and seizures. ACQUISITION: International 10-20 system for electrode placement; 18 channels with additional eyes linked to ipsilateral ears and EKG. Additional T1-T2 electrodes were used. Continuous video recording obtained.   EEG NUMBER:  MEDICATIONS:  Day 1: LEV, LZP, VPA  DAY #1: from 1311 01/01/16 to 0730 01/02/16  BACKGROUND: An overall medium voltage continuous recording with some spontaneous variability and reactivity. Waking background consisted of a medium voltage 3-7 Hz activity diffusely with sparse superimposed rest of frequencies.  There is also a regular delta range activity intermixed with the waking background.  There is no evidence of the posterior basic rhythm.  State changes were present, but no clear sleep architecture was seen.  EPILEPTIFORM/PERIODIC ACTIVITY: There were runs of medium voltage rhythmic generalized delta activity (GRDA) throughout the recording which were increased with stimulation.  These runs lasted between 2-15 minutes.  These were usually self-limited without evolution, however there was subtle ictal evolution a few times, further described below.  There were no clinical changes with these runs.  Isolated sharp waves were also seen primarily in the left temporal region, maximal T7.  SEIZURES: The above-mentioned runs of rhythmic slow activity showed subtle ictal evolution with increase in frequency up to approximately 3 Hz and increased sharp morphology.  These typically occur during stimulation and showed no clear clinical change.  These occurred at approximately 1324 (improvement following IV lorazepam), 1945, 0020, and then improved for several hours after starting valproate.  There was return again at approximately 0544.  EVENTS: Electrographic seizures, described above. EKG: no significant  arrhythmia  SUMMARY: This was a moderately abnormal continuous video EEG due to the presence of electrographic seizures evolving from stimulus-induced GRDA.  There is also absence of some normal physiologic rhythms.  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

## 2016-01-03 ENCOUNTER — Inpatient Hospital Stay (HOSPITAL_COMMUNITY): Payer: BC Managed Care – PPO

## 2016-01-03 LAB — GLUCOSE, CAPILLARY
GLUCOSE-CAPILLARY: 126 mg/dL — AB (ref 65–99)
GLUCOSE-CAPILLARY: 134 mg/dL — AB (ref 65–99)
GLUCOSE-CAPILLARY: 115 mg/dL — AB (ref 65–99)
GLUCOSE-CAPILLARY: 106 mg/dL — AB (ref 65–99)
Glucose-Capillary: 108 mg/dL — ABNORMAL HIGH (ref 65–99)
Glucose-Capillary: 113 mg/dL — ABNORMAL HIGH (ref 65–99)
Glucose-Capillary: 97 mg/dL (ref 65–99)

## 2016-01-03 LAB — COMPREHENSIVE METABOLIC PANEL
ALBUMIN: 2.2 g/dL — AB (ref 3.5–5.0)
ALT: 57 U/L — AB (ref 14–54)
AST: 28 U/L (ref 15–41)
Alkaline Phosphatase: 71 U/L (ref 38–126)
Anion gap: 6 (ref 5–15)
BUN: 21 mg/dL — AB (ref 6–20)
CO2: 35 mmol/L — ABNORMAL HIGH (ref 22–32)
CREATININE: 0.32 mg/dL — AB (ref 0.44–1.00)
Calcium: 8.8 mg/dL — ABNORMAL LOW (ref 8.9–10.3)
Chloride: 105 mmol/L (ref 101–111)
GFR calc Af Amer: >60 mL/min (ref >60)
GLUCOSE: 115 mg/dL — AB (ref 65–99)
POTASSIUM: 3.3 mmol/L — AB (ref 3.5–5.1)
Sodium: 146 mmol/L — ABNORMAL HIGH (ref 135–145)
Total Bilirubin: 0.8 mg/dL (ref 0.3–1.2)
Total Protein: 6 g/dL — ABNORMAL LOW (ref 6.5–8.1)

## 2016-01-03 LAB — CBC
HEMATOCRIT: 36.2 % (ref 36.0–46.0)
Hemoglobin: 11.5 g/dL — ABNORMAL LOW (ref 12.0–15.0)
MCH: 28.5 pg (ref 26.0–34.0)
MCHC: 31.8 g/dL (ref 30.0–36.0)
MCV: 89.6 fL (ref 78.0–100.0)
PLATELETS: 410 10*3/uL — AB (ref 150–400)
RBC: 4.04 MIL/uL (ref 3.87–5.11)
RDW: 15.5 % (ref 11.5–15.5)
WBC: 6.8 10*3/uL (ref 4.0–10.5)

## 2016-01-03 LAB — HEMOGLOBIN A1C
HEMOGLOBIN A1C: 6 % — AB (ref 4.8–5.6)
MEAN PLASMA GLUCOSE: 126 mg/dL

## 2016-01-03 LAB — MAGNESIUM: Magnesium: 2.1 mg/dL (ref 1.7–2.4)

## 2016-01-03 LAB — VALPROIC ACID LEVEL: VALPROIC ACID LVL: 26 ug/mL — AB (ref 50.0–100.0)

## 2016-01-03 LAB — PHOSPHORUS: Phosphorus: 2.6 mg/dL (ref 2.5–4.6)

## 2016-01-03 MED ORDER — ACETAMINOPHEN 160 MG/5ML PO SOLN
650.0000 mg | Freq: Four times a day (QID) | ORAL | Status: DC | PRN
  Administered 2016-01-03 (×2): 650 mg
  Filled 2016-01-03 (×3): qty 20.3

## 2016-01-03 MED ORDER — VALPROATE SODIUM 500 MG/5ML IV SOLN
1000.0000 mg | Freq: Once | INTRAVENOUS | Status: AC
  Administered 2016-01-03: 1000 mg via INTRAVENOUS
  Filled 2016-01-03 (×2): qty 10

## 2016-01-03 MED ORDER — POTASSIUM CHLORIDE 20 MEQ/15ML (10%) PO SOLN
40.0000 meq | Freq: Two times a day (BID) | ORAL | Status: DC
  Administered 2016-01-03 – 2016-01-04 (×3): 40 meq
  Filled 2016-01-03 (×3): qty 30

## 2016-01-03 MED ORDER — DEXTROSE 5 % IV SOLN
750.0000 mg | Freq: Four times a day (QID) | INTRAVENOUS | Status: DC
  Administered 2016-01-03 – 2016-01-04 (×6): 750 mg via INTRAVENOUS
  Filled 2016-01-03 (×9): qty 7.5

## 2016-01-03 MED ORDER — LORAZEPAM 2 MG/ML IJ SOLN
2.0000 mg | Freq: Once | INTRAMUSCULAR | Status: AC
Start: 2016-01-03 — End: 2016-01-03
  Administered 2016-01-03: 2 mg via INTRAVENOUS

## 2016-01-03 MED ORDER — OXCARBAZEPINE 300 MG PO TABS
600.0000 mg | ORAL_TABLET | Freq: Two times a day (BID) | ORAL | Status: DC
  Administered 2016-01-03 – 2016-01-04 (×4): 600 mg via ORAL
  Filled 2016-01-03 (×5): qty 2

## 2016-01-03 NOTE — Progress Notes (Signed)
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 Rehabilitation in [place] with return to [place]. She has had trach removed twice the most recent  4 week  PTA  by Dr. Lake Bells.  She presents to [facility] ED with increased resp distress, cxr cw multilobar pna. She was intubated by PCCM in ED and a new #6 Shiley trach was placed and she was transferred to ICU on full vent support.  SUBJECTIVE:   Blank stare/ w/u in progress by Neuro      VITAL SIGNS: BP 111/74   Pulse 87   Temp 100.2 F (37.9 C) (Oral)   Resp (!) 21   Ht 5\' 6"  (1.676 m)   Wt 119 lb 4.3 oz (54.1 kg)   LMP 12/01/2010   SpO2 93%   BMI 19.25 kg/m   HEMODYNAMICS:    VENTILATOR SETTINGS: Vent Mode: PRVC FiO2 (%):  [40 %] 40 % Set Rate:  [14 bmp] 14 bmp Vt Set:  [420 mL] 420 mL PEEP:  [5 cmH20] 5 cmH20 Plateau Pressure:  [12 cmH20-16 cmH20] 15 cmH20  INTAKE / OUTPUT: I/O last 3 completed shifts: In: 4160 [P.O.:120; I.V.:350; Other:90; NG/GT:2760; IV Piggyback:840] Out: 1150 [Urine:1150]  PHYSICAL EXAMINATION: General:  Eyes open. Family at bedside. Forward gaze. Neuro:  Aphasic. Doesn't follow commands. Not attending to voice. HEENT:  Tracheostomy in place. No scleral injection. Cardiovascular:  Regular rate. No edema. Unable to appreciate JVD. Pulmonary:  Coarse breath sounds bilaterally improving. Symmetric chest wall rise on ventilator. Abdomen:  Soft. PEG in place. Nondistended.  Integument:  Warm & dry. No rash on exposed skin.   LABS:  BMET  Recent Labs Lab 12/31/15 0236 01/02/16 0500 01/03/16 0257  NA 138 147* 146*  K 3.6 3.0* 3.3*  CL 96* 103 105  CO2 30 32 35*  BUN 13 26* 21*   CREATININE 0.43* 0.40* 0.32*  GLUCOSE 120* 131* 115*    Electrolytes  Recent Labs Lab 12/30/15 0442 12/30/15 1657  12/31/15 0236 01/02/16 0500 01/03/16 0257  CALCIUM 8.1*  --   < > 8.9 9.0 8.8*  MG 1.8 2.5*  --   --  2.1 2.1  PHOS 3.5 3.7  --   --   --  2.6  < > = values in this interval not displayed.  CBC  Recent Labs Lab 12/30/15 0442 01/02/16 0500 01/03/16 0257  WBC 14.1* 8.1 6.8  HGB 11.1* 11.9* 11.5*  HCT 33.0* 37.5 36.2  PLT 317 409* 410*    Coag's  Recent Labs Lab 12/28/15 1657  APTT 29  INR 1.14    Sepsis Markers  Recent Labs Lab 12/28/15 1525 12/28/15 1628 12/28/15 1657 12/28/15 1928  LATICACIDVEN 2.02* 2.6*  --  2.2*  PROCALCITON  --   --  0.97  --     ABG  Recent Labs Lab 12/28/15 1605 12/29/15 0345 12/30/15 0421  PHART 7.441 7.507* 7.484*  PCO2ART 42.6 34.7 38.2  PO2ART 60.9* 76.2* 85.2    Liver Enzymes  Recent Labs Lab 12/28/15 1115 12/28/15 1657 01/03/16 0257  AST 69* 83* 28  ALT 129* 122* 57*  ALKPHOS 131* 109 71  BILITOT 1.0 1.0 0.8  ALBUMIN 2.8* 2.5*  2.2*    Cardiac Enzymes No results for input(s): TROPONINI, PROBNP in the last 168 hours.  Glucose  Recent Labs Lab 01/02/16 1309 01/02/16 1632 01/02/16 2132 01/03/16 0056 01/03/16 0506 01/03/16 0939  GLUCAP 137* 109* 129* 97 126* 134*    Imaging No results found.   STUDIES:    MICROBIOLOGY: MRSA PCR 12/3:  Positive Influenza PCR 12/3:  Negative  Blood Ctx x2 12/3 >> Urine Ctx 12/3:  Negative Tracheal Asp Ctx 12/3:  Pseudomonas aeruginosa & MSSA Urine Streptococcal Antigen 12/3:  Positive   ANTIBIOTICS: Vancomycin 12/3 - 12/5 Zosyn 12/3 - 12/6 Fortaz 12/6 >>  SIGNIFICANT EVENTS: 12/03 - admit to ICU  LINES/TUBES: OETT 12/3 - 12/3 Trach #6 Shiley(JY) 12/3 >> PEG >> Foley 12/3 >> PIV  ASSESSMENT / PLAN:  65 y.o. female w/ h/o viral encephalopathy in November 2016 that resulted in an expressive aphasia & prior respiratory failure  with tracheostomy placement. Subsequently decannulated twice with last episode 4 weeks prior to admission. Presented with multifocal pneumonia and acute hypoxic respiratory failure. Patient successfully re-trached and currently on narrowed spectrum antibiotic coverage for her pneumonia. Respiratory status and ventilator support dependent on neuro recovery at this point .    1. Acute Hypoxic Respiratory Failure:  Continue vent support 2. Pseudomonas, Streptococcus pneumoniae, & MSSA Multilobar Pneumonia:  Currently on Day # 6/14 of Fortaz. 3. Sepsis:  Secondary to pneumonia/ resolved   Remainder of care as per primary service.    Christinia Gully, MD Pulmonary and [place] [phone] After 5:30 PM or weekends, call [phone]

## 2016-01-03 NOTE — Plan of Care (Signed)
Problem: Activity: Goal: Risk for activity intolerance will decrease Outcome: Progressing Performed range of motion with patient's legs and arms. Patient unable to communicate learning at this time.

## 2016-01-03 NOTE — Procedures (Signed)
LTM-EEG Report  HISTORY: Continuous video-EEG monitoring performed for 65 year old with history of encephalitis, presenting with altered mental status and seizures. ACQUISITION: International 10-20 system for electrode placement; 18 channels with additional eyes linked to ipsilateral ears and EKG. Additional T1-T2 electrodes were used. Continuous video recording obtained.   EEG NUMBER:  MEDICATIONS:  Day 1: LEV, LZP, VPA Day 1: LEV, VPA, LCM  DAY #1: from 1311 01/01/16 to 0730 01/02/16 BACKGROUND: An overall medium voltage continuous recording with some spontaneous variability and reactivity. Waking background consisted of a medium voltage 3-7 Hz activity diffusely with sparse superimposed rest of frequencies.  There is also a regular delta range activity intermixed with the waking background.  There is no evidence of the posterior basic rhythm.  State changes were present, but no clear sleep architecture was seen. EPILEPTIFORM/PERIODIC ACTIVITY: There were runs of medium voltage rhythmic generalized delta activity (GRDA) throughout the recording which were increased with stimulation.  These runs lasted between 2-15 minutes.  These were usually self-limited without evolution, however there was subtle ictal evolution a few times, further described below.  There were no clinical changes with these runs.  Isolated sharp waves were also seen primarily in the left temporal region, maximal T7. SEIZURES: The above-mentioned runs of rhythmic slow activity showed subtle ictal evolution with increase in frequency up to approximately 3 Hz and increased sharp morphology.  These typically occur during stimulation and showed no clear clinical change.  These occurred at approximately 1324 (improvement following IV lorazepam), 1945, 0020, and then improved for several hours after starting valproate.  There was return again at approximately 0544.  EVENTS: Electrographic seizures, described above. EKG: no significant  arrhythmia  DAY #2: from 0730 01/02/16 to 0730 01/03/16 BACKGROUND: An overall medium voltage continuous recording with some spontaneous variability and reactivity. Waking background consisted of a medium voltage 3-7 Hz activity diffusely with sparse superimposed rest of frequencies.  There is also a regular delta range activity intermixed with the waking background.  There is no evidence of the posterior basic rhythm.  State changes were present, but no clear sleep architecture was seen. EPILEPTIFORM/PERIODIC ACTIVITY: There continued to be frequent runs of medium voltage, rhythmic generalized delta activity which were increased with stimulation and sharply contoured at times (GRDA+S).  These runs lasted between 2-30 minutes. These initially showed some subtle evolution in the early portion of the recording, however this was not apparent after 1300 with increased medications. SEIZURES: Some subtle evolution was seen with the runs of stimulus-induced rhythmic activity above, improved after 1300 with increasing medications.  EVENTS: none reported EKG: no significant arrhythmia  SUMMARY: This was a moderately abnormal continuous video EEG due to the presence of electrographic seizures evolving from stimulus-induced GRDA. The seizures improved with increasing medications in the afternoon of 01/02/16. There is also absence of some normal physiologic rhythms.  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

## 2016-01-03 NOTE — Progress Notes (Addendum)
Subjective: Continued to have seizures, improved overnight, but again worsening this a.m.  Exam: Vitals:   01/03/16 1532 01/03/16 1624  BP: 118/68 119/86  Pulse: 80 81  Resp: (!) 21 (!) 22  Temp: 97.7 F (36.5 C)    Gen: In bed, Tracheostomy Abd: soft, nt  Neuro: MS: Responds to noxious stimuli, but does not fixate or track or follow commands CN: Physical round and reactive to light, does not blink to threat Motor: She withdraws all extremities to noxious stimulation Sensory: As above  During my exam, she appeared to have a left gaze preference.   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 She had not had known seizures between the time of her discharge and her current hospitalization. She has improved on EEG some, but continues to have frequent seizures.   Recommendations: 1) continue Keppra at 1500 mg twice a day 2) Vimpat 200 mg twice a day 3) increase Depakote to 750 every 6 hours 4) Depakote level tomorrow 5) Trileptal 600 mg twice a day 6) neurology will continue to follow  Roland Rack, MD [facility] [phone]  If 7pm- 7am, please page neurology on call as listed in [place].

## 2016-01-03 NOTE — Plan of Care (Signed)
Problem: Education: Goal: Knowledge of [facility] Education information/materials will improve Outcome: Progressing Discussed with patient's husband about day and plan of care for the night with teach back displayed

## 2016-01-03 NOTE — Plan of Care (Signed)
Problem: Physical Regulation: Goal: Ability to maintain clinical measurements within normal limits will improve Outcome: Progressing Discussed with first shift nurse about temperature regulation and [place] with husband about plan of care for the evening with teach back displayed

## 2016-01-03 NOTE — Progress Notes (Signed)
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    She presented to [facility] ED with increased resp distress and a CXR suggestive of multilobar PNA. She was intubated by PCCM in the ED and a new #6 Shiley trach was placed.  She was transferred to ICU on full vent support.  Significant Events: 12/3 admit to ICU under PCCM - intubated in ED - #6 Shiley placed by [facility]  12/6 MS altered. Not focal. EEG c/w status. AEDs started. Neuro following.  12/7 TRH assumed attending role - PCCM still following  Subjective: The pt is non-communicative.  There is no family present at the time of my exam today.  She does not appear to be in any acute respiratory distress and there is no evidence of uncontrolled pain.  There is no observed outward evidence of active seizure activity at the time of my exam.  Assessment & Plan:  New onset Seizure activity / Status  Medication titration per Neurology who is actively following   Acute hypoxic respiratory failure - Pseudomonas and MSSA multilobar HCAP ongoing respiratory care per PCCM - plan to stop abx after 7 day course (last dose today) as evidence of active infection has waned and abx could be contributing to seizure activity   Hyperglycemia  A1c not c/w true DM at 6.0 - CBG controlled at present   Acute Encephalopathy  -Viral Encephalopathy 11/2014 with contracture of left arm, left leg, expressive aphasia  -had reportedly made signif improvements w/ ongoing rehab effors, but then developed worsening mental status 12/3 requiring intubation -appears to be due to combination of acute infection (PNA) as well as seizure activity   Hypernatremia Sodium trending down with increased free water via PEG tube - continue  and follow  Hypokalemia  Continue to replace and follow - Mg is normal   Bilateral upper extremity contractures Therapy is following   MRSA screen +  DVT prophylaxis: SQ heparin  Code Status: FULL CODE Family Communication: No family present at time of exam today  Disposition Plan: SDU   Consultants:  PCCM Neurology   Antimicrobials:  Cefepime 12/7 > 12/9 Fortaz 12/6 > 12/7 Zosyn 12/3 > 12/6 Vancomycin 12/3 > 12/6  Objective: Blood pressure 105/70, pulse 81, temperature 99.8 F (37.7 C), temperature source Axillary, resp. rate (!) 24, height '5\' 6"'$  (1.676 m), weight 54.1 kg (119 lb 4.3 oz), last menstrual period 12/01/2010, SpO2 98 %.  Intake/Output Summary (Last 24 hours) at 01/03/16 1440 Last data filed at 01/03/16 0815  Gross per 24 hour  Intake             2680 ml  Output              250 ml  Net             2430 ml   Filed Weights   01/01/16 0500 01/02/16 0433 01/03/16 0337  Weight: 53.7 kg (118 lb 6.2 oz) 55.1 kg (121 lb 7.6 oz) 54.1 kg (119 lb 4.3 oz)    Examination: General: No acute respiratory distress evident  Lungs: Clear to auscultation bilaterally without wheezing Cardiovascular: Regular rate and rhythm without murmur gallop or rub  Abdomen: Nondistended, soft, bowel sounds positive, no rebound, no ascites Extremities: No significant edema bilateral lower extremities  CBC:  Recent Labs Lab 12/28/15 1115 12/28/15 1657 12/29/15 0214 12/30/15 0442 01/02/16 0500 01/03/16 0257  WBC 18.7* 19.5* 18.5* 14.1* 8.1 6.8  NEUTROABS 16.8*  --   --  11.6*  --   --   HGB 15.1* 13.5 11.5* 11.1* 11.9* 11.5*  HCT 43.7 39.5 34.6* 33.0* 37.5 36.2  MCV 86.0 85.3 86.1 85.3 89.1 89.6  PLT 319 268 281 317 409* 390*   Basic Metabolic Panel:  Recent Labs Lab 12/29/15 1229 12/29/15 1756 12/30/15 0442 12/30/15 1657 12/30/15 1932 12/31/15 0236 01/02/16 0500 01/03/16 0257  NA  --   --  134*  --  136 138 147* 146*  K  --   --  2.9*  --  3.7 3.6 3.0* 3.3*    CL  --   --  97*  --  99* 96* 103 105  CO2  --   --  28  --  27 30 32 35*  GLUCOSE  --   --  133*  --  130* 120* 131* 115*  BUN  --   --  11  --  12 13 26* 21*  CREATININE  --   --  0.35*  --  0.38* 0.43* 0.40* 0.32*  CALCIUM  --   --  8.1*  --  8.3* 8.9 9.0 8.8*  MG 1.7 1.7 1.8 2.5*  --   --  2.1 2.1  PHOS 3.4 3.5 3.5 3.7  --   --   --  2.6   GFR: Estimated Creatinine Clearance: 60.7 mL/min (by C-G formula based on SCr of 0.32 mg/dL (L)).  Liver Function Tests:  Recent Labs Lab 12/28/15 1115 12/28/15 1657 01/03/16 0257  AST 69* 83* 28  ALT 129* 122* 57*  ALKPHOS 131* 109 71  BILITOT 1.0 1.0 0.8  PROT 7.2 6.3* 6.0*  ALBUMIN 2.8* 2.5* 2.2*    Coagulation Profile:  Recent Labs Lab 12/28/15 1657  INR 1.14    HbA1C: Hgb A1c MFr Bld  Date/Time Value Ref Range Status  01/02/2016 05:00 AM 6.0 (H) 4.8 - 5.6 % Final    Comment:    (NOTE)         Pre-diabetes: 5.7 - 6.4         Diabetes: >6.4         Glycemic control for adults with diabetes: <7.0     CBG:  Recent Labs Lab 01/02/16 2132 01/03/16 0056 01/03/16 0506 01/03/16 0939 01/03/16 1228  GLUCAP 129* 97 126* 134* 115*    Recent Results (from the past 240 hour(s))  Blood Culture (routine x 2)     Status: None   Collection Time: 12/28/15 11:15 AM  Result Value Ref Range Status   Specimen Description BLOOD LEFT HAND  Final   Special Requests IN PEDIATRIC BOTTLE 3CC  Final   Culture NO GROWTH 5 DAYS  Final   Report Status 01/02/2016 FINAL  Final  Blood Culture (routine x 2)     Status: None   Collection Time: 12/28/15 11:15 AM  Result Value Ref Range Status   Specimen Description BLOOD RIGHT ANTECUBITAL  Final   Special Requests BOTTLES DRAWN AEROBIC AND ANAEROBIC 5CC  Final   Culture NO GROWTH 5 DAYS  Final   Report Status 01/02/2016 FINAL  Final  Urine culture     Status: None   Collection Time: 12/28/15 12:05 PM  Result Value Ref Range Status   Specimen Description URINE, RANDOM  Final    Special Requests NONE  Final   Culture NO GROWTH  Final   Report Status 12/29/2015 FINAL  Final  Pneumocystis smear by DFA     Status: None   Collection Time: 12/28/15  2:00 PM  Result Value Ref Range Status   Specimen Source-PJSRC TRACHEAL ASPIRATE  Final   Pneumocystis jiroveci Ag NEGATIVE  Final    Comment: Performed at [facility]  MRSA PCR Screening     Status: Abnormal   Collection Time: 12/28/15  3:53 PM  Result Value Ref Range Status   MRSA by PCR POSITIVE (A) NEGATIVE Final    Comment:        The GeneXpert MRSA Assay (FDA approved for NASAL specimens only), is one component of a comprehensive MRSA colonization surveillance program. It is not intended to diagnose MRSA infection nor to guide or monitor treatment for MRSA infections. RESULT CALLED TO, READ BACK BY AND VERIFIED WITH: J.PEARIN,RN 12/28/15 '@1930'$  BY V.WILKINS   Culture, respiratory (NON-Expectorated)     Status: None   Collection Time: 12/28/15  4:10 PM  Result Value Ref Range Status   Specimen Description TRACHEAL ASPIRATE  Final   Special Requests Normal  Final   Gram Stain   Final    ABUNDANT WBC PRESENT,BOTH PMN AND MONONUCLEAR RARE GRAM POSITIVE COCCI IN PAIRS RARE GRAM POSITIVE RODS    Culture   Final    FEW STAPHYLOCOCCUS AUREUS FEW PSEUDOMONAS AERUGINOSA    Report Status 12/31/2015 FINAL  Final   Organism ID, Bacteria STAPHYLOCOCCUS AUREUS  Final   Organism ID, Bacteria PSEUDOMONAS AERUGINOSA  Final      Susceptibility   Pseudomonas aeruginosa - MIC*    CEFTAZIDIME 2 SENSITIVE Sensitive     CIPROFLOXACIN <=0.25 SENSITIVE Sensitive     GENTAMICIN <=1 SENSITIVE Sensitive     IMIPENEM 1 SENSITIVE Sensitive     PIP/TAZO 8 SENSITIVE Sensitive     CEFEPIME <=1 SENSITIVE Sensitive     * FEW PSEUDOMONAS AERUGINOSA   Staphylococcus aureus - MIC*    CIPROFLOXACIN <=0.5 SENSITIVE Sensitive     ERYTHROMYCIN >=8 RESISTANT Resistant     GENTAMICIN <=0.5 SENSITIVE Sensitive      OXACILLIN <=0.25 SENSITIVE Sensitive     TETRACYCLINE <=1 SENSITIVE Sensitive     VANCOMYCIN <=0.5 SENSITIVE Sensitive     TRIMETH/SULFA <=10 SENSITIVE Sensitive     CLINDAMYCIN <=0.25 RESISTANT Resistant     RIFAMPIN <=0.5 SENSITIVE Sensitive     Inducible Clindamycin POSITIVE Resistant     * FEW STAPHYLOCOCCUS AUREUS     Scheduled Meds: . ceFEPime (MAXIPIME) IV  2 g Intravenous Q8H  . chlorhexidine gluconate (MEDLINE KIT)  15 mL Mouth Rinse BID  . Chlorhexidine Gluconate Cloth  6 each Topical Q0600  . free water  200 mL Per Tube Q4H  . heparin  5,000 Units Subcutaneous Q8H  . insulin aspart  0-9 Units Subcutaneous Q4H  . lacosamide (VIMPAT) IV  200 mg Intravenous Q12H  . levETIRAcetam  1,500 mg Intravenous BID  . mouth rinse  15 mL Mouth Rinse QID  . OXcarbazepine  600 mg Oral BID  . pantoprazole sodium  40 mg Per Tube QHS  . potassium chloride  20 mEq Per Tube TID  . valproate sodium  750 mg Intravenous Q6H   Continuous Infusions: . sodium chloride 10 mL/hr at 01/03/16 0547  . feeding supplement (VITAL AF 1.2 CAL) 1,000 mL (01/03/16 0713)     LOS: 6 days   Cherene Altes, MD  [facility] Office  [phone] Pager - Text Page per Shea Evans as per below:  On-Call/Text Page:      [email or web address]      password [email or web address]  If 7PM-7AM, please contact night-coverage [email or web address] Password [email or web address] 01/03/2016, 2:40 PM

## 2016-01-04 ENCOUNTER — Inpatient Hospital Stay (HOSPITAL_COMMUNITY): Payer: BC Managed Care – PPO

## 2016-01-04 LAB — BASIC METABOLIC PANEL
Anion gap: 8 (ref 5–15)
BUN: 17 mg/dL (ref 6–20)
CHLORIDE: 104 mmol/L (ref 101–111)
CO2: 28 mmol/L (ref 22–32)
CREATININE: 0.3 mg/dL — AB (ref 0.44–1.00)
Calcium: 8.6 mg/dL — ABNORMAL LOW (ref 8.9–10.3)
GFR calc non Af Amer: >60 mL/min (ref >60)
Glucose, Bld: 116 mg/dL — ABNORMAL HIGH (ref 65–99)
POTASSIUM: 4.9 mmol/L (ref 3.5–5.1)
Sodium: 140 mmol/L (ref 135–145)

## 2016-01-04 LAB — CBC
HEMATOCRIT: 35.1 % — AB (ref 36.0–46.0)
Hemoglobin: 11 g/dL — ABNORMAL LOW (ref 12.0–15.0)
MCH: 28 pg (ref 26.0–34.0)
MCHC: 31.3 g/dL (ref 30.0–36.0)
MCV: 89.3 fL (ref 78.0–100.0)
PLATELETS: 390 10*3/uL (ref 150–400)
RBC: 3.93 MIL/uL (ref 3.87–5.11)
RDW: 15.6 % — ABNORMAL HIGH (ref 11.5–15.5)
WBC: 7.5 10*3/uL (ref 4.0–10.5)

## 2016-01-04 LAB — GLUCOSE, CAPILLARY
GLUCOSE-CAPILLARY: 99 mg/dL (ref 65–99)
GLUCOSE-CAPILLARY: 87 mg/dL (ref 65–99)
GLUCOSE-CAPILLARY: 108 mg/dL — AB (ref 65–99)
GLUCOSE-CAPILLARY: 116 mg/dL — AB (ref 65–99)
GLUCOSE-CAPILLARY: 89 mg/dL (ref 65–99)
Glucose-Capillary: 96 mg/dL (ref 65–99)

## 2016-01-04 LAB — VALPROIC ACID LEVEL: Valproic Acid Lvl: 57 ug/mL (ref 50.0–100.0)

## 2016-01-04 MED ORDER — FREE WATER
200.0000 mL | Freq: Four times a day (QID) | Status: DC
  Administered 2016-01-04 – 2016-01-05 (×2): 200 mL

## 2016-01-04 MED ORDER — CHLORHEXIDINE GLUCONATE 0.12% ORAL RINSE (MEDLINE KIT): 15.0000 mL | Freq: Two times a day (BID) | OROMUCOSAL | 0 refills | Status: DC

## 2016-01-04 MED ORDER — ACETAMINOPHEN 160 MG/5ML PO SOLN: 650.0000 mg | Freq: Four times a day (QID) | ORAL | 0 refills | Status: DC | PRN

## 2016-01-04 MED ORDER — FENTANYL CITRATE (PF) 100 MCG/2ML IJ SOLN: 25.0000 ug | INTRAMUSCULAR | 0 refills | Status: DC | PRN

## 2016-01-04 MED ORDER — FREE WATER
200.0000 mL | Freq: Four times a day (QID) | Status: DC
Start: 2016-01-04 — End: 2016-02-25

## 2016-01-04 MED ORDER — ORAL CARE MOUTH RINSE: 15.0000 mL | Freq: Four times a day (QID) | OROMUCOSAL | 0 refills | Status: DC

## 2016-01-04 MED ORDER — LORAZEPAM 2 MG/ML IJ SOLN
2.0000 mg | Freq: Once | INTRAMUSCULAR | Status: AC
  Administered 2016-01-04: 2 mg via INTRAVENOUS
  Filled 2016-01-04: qty 1

## 2016-01-04 MED ORDER — SODIUM CHLORIDE 0.9 % IV SOLN: 200.0000 mg | Freq: Two times a day (BID) | INTRAVENOUS | Status: DC

## 2016-01-04 MED ORDER — PANTOPRAZOLE SODIUM 40 MG PO PACK: 40.0000 mg | PACK | Freq: Every day | ORAL | Status: DC

## 2016-01-04 MED ORDER — SODIUM CHLORIDE 0.9 % IV SOLN: 10.0000 mL | INTRAVENOUS | 0 refills | Status: DC

## 2016-01-04 MED ORDER — VALPROATE SODIUM 500 MG/5ML IV SOLN: 750.0000 mg | Freq: Four times a day (QID) | INTRAVENOUS | Status: DC

## 2016-01-04 MED ORDER — LORAZEPAM 2 MG/ML IJ SOLN: 1.0000 mg | INTRAMUSCULAR | 0 refills | Status: DC | PRN

## 2016-01-04 MED ORDER — SODIUM CHLORIDE 0.9 % IV SOLN: 1500.0000 mg | Freq: Two times a day (BID) | INTRAVENOUS | Status: DC

## 2016-01-04 MED ORDER — HEPARIN SODIUM (PORCINE) 5000 UNIT/ML IJ SOLN
5000.0000 [IU] | Freq: Three times a day (TID) | INTRAMUSCULAR | Status: DC
Start: 2016-01-04 — End: 2016-02-25

## 2016-01-04 MED ORDER — INSULIN ASPART 100 UNIT/ML ~~LOC~~ SOLN: 0.0000 [IU] | SUBCUTANEOUS | 11 refills | Status: DC

## 2016-01-04 MED ORDER — VITAL AF 1.2 CAL PO LIQD: 1000.0000 mL | ORAL | Status: DC

## 2016-01-04 MED ORDER — OXCARBAZEPINE 600 MG PO TABS: 600.0000 mg | ORAL_TABLET | Freq: Two times a day (BID) | ORAL | Status: DC

## 2016-01-04 NOTE — Discharge Summary (Addendum)
DISCHARGE SUMMARY  Judy Spears  MR#: [id number]  DOB:May 19, 1950  Date of Admission: 12/28/2015 Date of Discharge: 01/04/2016  Attending Physician:Draeden Kellman T  Patient's ZJI:[id number],BOFBP, MD  Consults: PCCM Neurology   Disposition: Transfer to [facility] at suggestion of Neurology service   Tests Needing Follow-up: -attempt to avoid antibiotic dosing due to concern for lowering her seizure threshold - Maxipime discontinued on 12/9 at ~7:30PM -continue vent weaning as able  -pt may follow up w/ [facility] (Dr. 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 She had undergone extensive rehabilitation efforts.  She had recovered to the point that she could effectively communicate with her family, though she remained aphasic.  She was able to get out into public with help of a walker/seat and assistance from her family, but was not independent in her movements due to persisting paralysis/profound weakness.     She presented to [facility] ED 12/28/2015 with increased resp distress and a CXR suggestive of multilobar PNA. She was intubated by PCCM in the ED and anew #6 Shiley trachwas placed percutaneously via her incompletely healed stoma using bronchoscopic guidance.  She was transferred to ICU on full vent support.  Hospital Course:  Following her admission and emergent intubation/tracheostomy placement the patient was treated for  multilobar healthcare associated pneumonia.  Attempts were made to wean her from the vent early in the hospital course, but she continued to require ventilatory support due to persisting tachypnea while on pressure support trials.  Sputum cultures revealed methicillin susceptible staph aureus (despite MRSA+ nasal swab) and pansensitive pseudomonas.  Zosyn and vancomycin were utilized initially from December 3 through December 6 with transition to cefepime on December 7 when final culture data became available.    She was able to be transitioned to a stepdown unit where frequent attempts at weaning her from the ventilator continued.  On 01/01/2016 it was felt that her her mental status had declined and therefore an EEG was obtained which captured seizure activity.  Neurology was consulted and followed the patient throughout the remainder of the hospital stay.  Numerous antiepileptic drugs were administered and the patient was monitored on continuous EEG but her frequent seizures persisted.  Cefepime was discontinued on December 9 (completing 7 full days of antibiotic therapy ) due to concern that it could be lowering her seizure threshold.  CT scan of the head was without acute finding.  After considering continuous drips for seizure suppression Neurology ultimately suggested that the patient's refractory seizure activity would best be evaluated at a tertiary care center.  The neurology team contacted the epilepsy team at [facility] who agreed to accept the patient in transfer.  Neurology discussed this with the patient's family/husband who consented to the transfer.  The following active medical issues were being addressed at the time of the patient's transfer:  New onset Seizure activity / Status  Medication titration per Neurology who is actively following - seizure activity appears to be ongoing per Neuro   Acute hypoxic respiratory failure - Pseudomonas and MSSA  multilobar HCAP ongoing respiratory care per PCCM - stopped abx after 7 day course (last dose 12/09) as evidence of active infection has waned and abx could be contributing to seizure activity   Hyperglycemia  A1c not c/w true DM at 6.0 - CBG controlled at present   Acute Encephalopathy  -Viral Encephalopathy 11/2014 with contracture of left arm, left leg, expressive aphasia  -had reportedly made signif improvements w/ ongoing rehab effors, but then developed worsening mental status 12/3 requiring emergent medical attention > intubation -appears to be due to combination of acute infection (PNA) as well as seizure activity   Hypernatremia Sodium trending down with increased free water via PEG tube - continue and follow  Hypokalemia  Continue to replace and follow - Mg is normal   Bilateral upper extremity contractures Therapy is following   MRSA screen +  Antimicrobials:  Cefepime 12/7 > 12/9 Tressie Ellis 12/6 >12/7 Zosyn 12/3 >12/6 Vancomycin 12/3 >12/6    Medication List    STOP taking these medications   amantadine 50 MG/5ML solution Commonly known as:  SYMMETREL   amoxicillin 400 MG/5ML suspension Commonly known as:  AMOXIL   atorvastatin 20 MG tablet Commonly known as:  LIPITOR   clonazePAM 0.5 MG tablet Commonly known as:  KLONOPIN   COQ-10 PO   docusate 50 MG/5ML liquid Commonly known as:  COLACE   fluconazole 100 MG tablet Commonly known as:  DIFLUCAN   fluticasone 50 MCG/ACT nasal spray Commonly known as:  FLONASE   loratadine 10 MG tablet Commonly known as:  CLARITIN   Nystatin Powd   PRESCRIPTION MEDICATION   PROBIOTIC COLIC Liqd   traZODone 50 MG tablet Commonly known as:  DESYREL   valACYclovir 500 MG tablet Commonly known as:  VALTREX     TAKE these medications   acetaminophen 160 MG/5ML solution Commonly known as:  TYLENOL Place 20.3 mLs (650 mg total) into feeding tube every 6 (six) hours as needed for mild pain, headache  or fever.   chlorhexidine gluconate (MEDLINE KIT) 0.12 % solution Commonly known as:  PERIDEX 15 mLs by Mouth Rinse route 2 (two) times daily. What changed:  how to take this  when to take this   feeding supplement (VITAL AF 1.2 CAL) Liqd Place 1,000 mLs into feeding tube continuous.   fentaNYL 100 MCG/2ML injection Commonly known as:  SUBLIMAZE Inject 0.5 mLs (25 mcg total) into the vein every 2 (two) hours as needed for severe pain.   free water Soln Place 200 mLs into feeding tube every 6 (six) hours.   heparin 5000 UNIT/ML injection Inject 1 mL (5,000 Units total) into the skin every 8 (eight) hours.   insulin aspart 100 UNIT/ML injection Commonly known as:  novoLOG Inject 0-9 Units into the skin every 4 (four) hours.   lacosamide 200 mg in sodium chloride 0.9 % 25 mL Inject 200 mg into the vein every 12 (twelve) hours.   levETIRAcetam 1,500 mg in sodium chloride 0.9 % 100 mL Inject 1,500 mg into the vein 2 (two) times daily.   LORazepam 2 MG/ML injection Commonly known as:  ATIVAN Inject 0.5-1 mLs (1-2 mg total) into the vein every 15 (fifteen) minutes as needed for seizure (Notify MD/Neurology if needed/given).   mouth rinse Liqd solution 15 mLs by Mouth Rinse route QID. Start taking on:  01/05/2016   oxcarbazepine 600 MG tablet Commonly known as:  TRILEPTAL Take 1 tablet (600 mg total) by mouth 2 (two) times daily.   pantoprazole sodium  40 mg/20 mL Pack Commonly known as:  PROTONIX Place 20 mLs (40 mg total) into feeding tube at bedtime.   sodium chloride 0.9 % infusion Inject 10 mLs into the vein continuous.   valproate 750 mg in dextrose 5 % 50 mL Inject 750 mg into the vein every 6 (six) hours.       Day of Discharge BP 95/62   Pulse 74   Temp 99.1 F (37.3 C) (Oral)   Resp (!) 24   Ht _0  (1.676 m)   Wt 56.6 kg (124 lb 12.5 oz)   LMP 12/01/2010   SpO2 96%   BMI 20.14 kg/m   Physical Exam: General: No acute respiratory distress on  vent via trach  Lungs: Clear to auscultation bilaterally w/ exception to some mild crackles in B bases  Cardiovascular: Regular rate and rhythm without murmur  Abdomen: Nondistended, soft, bowel sounds positive, no rebound, no ascites - PEG insertion clean and dry  Extremities: No significant edema bilateral lower extremities  Basic Metabolic Panel:  Recent Labs Lab 12/29/15 1229 12/29/15 1756 12/30/15 0442 12/30/15 1657 12/30/15 1932 12/31/15 0236 01/02/16 0500 01/03/16 0257 01/04/16 0137  NA  --   --  134*  --  136 138 147* 146* 140  K  --   --  2.9*  --  3.7 3.6 3.0* 3.3* 4.9  CL  --   --  97*  --  99* 96* 103 105 104  CO2  --   --  28  --  27 30 32 35* 28  GLUCOSE  --   --  133*  --  130* 120* 131* 115* 116*  BUN  --   --  11  --  12 13 26* 21* 17  CREATININE  --   --  0.35*  --  0.38* 0.43* 0.40* 0.32* 0.30*  CALCIUM  --   --  8.1*  --  8.3* 8.9 9.0 8.8* 8.6*  MG 1.7 1.7 1.8 2.5*  --   --  2.1 2.1  --   PHOS 3.4 3.5 3.5 3.7  --   --   --  2.6  --     Liver Function Tests:  Recent Labs Lab 01/03/16 0257  AST 28  ALT 57*  ALKPHOS 71  BILITOT 0.8  PROT 6.0*  ALBUMIN 2.2*    CBC:  Recent Labs Lab 12/29/15 0214 12/30/15 0442 01/02/16 0500 01/03/16 0257 01/04/16 0137  WBC 18.5* 14.1* 8.1 6.8 7.5  NEUTROABS  --  11.6*  --   --   --   HGB 11.5* 11.1* 11.9* 11.5* 11.0*  HCT 34.6* 33.0* 37.5 36.2 35.1*  MCV 86.1 85.3 89.1 89.6 89.3  PLT 281 317 409* 410* 390    CBG:  Recent Labs Lab 01/03/16 2350 01/04/16 0400 01/04/16 0754 01/04/16 1215 01/04/16 1616  GLUCAP 108* 96 99 87 108*    Recent Results (from the past 240 hour(s))  Blood Culture (routine x 2)     Status: None   Collection Time: 12/28/15 11:15 AM  Result Value Ref Range Status   Specimen Description BLOOD LEFT HAND  Final   Special Requests IN PEDIATRIC BOTTLE 3CC  Final   Culture NO GROWTH 5 DAYS  Final   Report Status 01/02/2016 FINAL  Final  Blood Culture (routine x 2)     Status:  None   Collection Time: 12/28/15 11:15 AM  Result Value Ref Range Status   Specimen Description BLOOD RIGHT ANTECUBITAL  Final   Special Requests BOTTLES DRAWN AEROBIC AND ANAEROBIC 5CC  Final   Culture NO GROWTH 5 DAYS  Final   Report Status 01/02/2016 FINAL  Final  Urine culture     Status: None   Collection Time: 12/28/15 12:05 PM  Result Value Ref Range Status   Specimen Description URINE, RANDOM  Final   Special Requests NONE  Final   Culture NO GROWTH  Final   Report Status 12/29/2015 FINAL  Final  Pneumocystis smear by DFA     Status: None   Collection Time: 12/28/15  2:00 PM  Result Value Ref Range Status   Specimen Source-PJSRC TRACHEAL ASPIRATE  Final   Pneumocystis jiroveci Ag NEGATIVE  Final    Comment: Performed at [facility]  MRSA PCR Screening     Status: Abnormal   Collection Time: 12/28/15  3:53 PM  Result Value Ref Range Status   MRSA by PCR POSITIVE (A) NEGATIVE Final    Comment:        The GeneXpert MRSA Assay (FDA approved for NASAL specimens only), is one component of a comprehensive MRSA colonization surveillance program. It is not intended to diagnose MRSA infection nor to guide or monitor treatment for MRSA infections. RESULT CALLED TO, READ BACK BY AND VERIFIED WITH: J.PEARIN,RN 12/28/15 _0  BY V.WILKINS   Culture, respiratory (NON-Expectorated)     Status: None   Collection Time: 12/28/15  4:10 PM  Result Value Ref Range Status   Specimen Description TRACHEAL ASPIRATE  Final   Special Requests Normal  Final   Gram Stain   Final    ABUNDANT WBC PRESENT,BOTH PMN AND MONONUCLEAR RARE GRAM POSITIVE COCCI IN PAIRS RARE GRAM POSITIVE RODS    Culture   Final    FEW STAPHYLOCOCCUS AUREUS FEW PSEUDOMONAS AERUGINOSA    Report Status 12/31/2015 FINAL  Final   Organism ID, Bacteria STAPHYLOCOCCUS AUREUS  Final   Organism ID, Bacteria PSEUDOMONAS AERUGINOSA  Final      Susceptibility   Pseudomonas aeruginosa - MIC*     CEFTAZIDIME 2 SENSITIVE Sensitive     CIPROFLOXACIN <=0.25 SENSITIVE Sensitive     GENTAMICIN <=1 SENSITIVE Sensitive     IMIPENEM 1 SENSITIVE Sensitive     PIP/TAZO 8 SENSITIVE Sensitive     CEFEPIME <=1 SENSITIVE Sensitive     * FEW PSEUDOMONAS AERUGINOSA   Staphylococcus aureus - MIC*    CIPROFLOXACIN <=0.5 SENSITIVE Sensitive     ERYTHROMYCIN >=8 RESISTANT Resistant     GENTAMICIN <=0.5 SENSITIVE Sensitive     OXACILLIN <=0.25 SENSITIVE Sensitive     TETRACYCLINE <=1 SENSITIVE Sensitive     VANCOMYCIN <=0.5 SENSITIVE Sensitive     TRIMETH/SULFA <=10 SENSITIVE Sensitive     CLINDAMYCIN <=0.25 RESISTANT Resistant     RIFAMPIN <=0.5 SENSITIVE Sensitive     Inducible Clindamycin POSITIVE Resistant     * FEW STAPHYLOCOCCUS AUREUS    Ct Head Wo Contrast  Result Date: 01/04/2016 CLINICAL DATA:  Seizure EXAM: CT HEAD WITHOUT CONTRAST TECHNIQUE: Contiguous axial images were obtained from the base of the skull through the vertex without intravenous contrast. COMPARISON:  None. FINDINGS: Brain: Mild frontal lobe atrophy. Frontal horn enlargement bilaterally due to volume loss in the frontal lobes. There is hypodensity in the frontal white matter bilaterally. There is volume loss in the left caudate with asymmetric enlargement of the left ventricle. Occipital parietal lung volume normal. Mild cerebellar atrophy. Negative for acute infarct. Negative for hemorrhage or mass  lesion. No fluid collection and no shift of the midline structures. Vascular: No hyperdense vessel or unexpected calcification. Skull: Negative Sinuses/Orbits: Negative Other: None IMPRESSION: No acute abnormality Frontal lobe atrophy. Volume loss in the left basal ganglia. Hypodensity in the frontal white matter bilaterally. Mild cerebellar atrophy. No focal infarct or hemorrhage.  Negative for mass lesion. Electronically Signed   By: Franchot Gallo M.D.   On: 01/04/2016 13:43   Dg Chest Port 1 View  Result Date:  01/03/2016 CLINICAL DATA:  Follow-up infiltrate. EXAM: PORTABLE CHEST 1 VIEW COMPARISON:  January 01, 2016 FINDINGS: No pneumothorax. Infiltrate in medial right lung base has improved but persists. No other interval changes. IMPRESSION: Infiltrate in medial right lung base persists but is improved. Recommend follow-up to complete resolution. No other change. Electronically Signed   By: Dorise Bullion III M.D   On: 01/03/2016 16:28   Time spent in discharge (includes decision making & examination of pt): >30 minutes  01/04/2016, 6:27 PM   Cherene Altes, MD [facility] Office  [phone] Pager [phone]  On-Call/Text Page:      [email or web address]      password [facility]

## 2016-01-04 NOTE — Progress Notes (Signed)
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    She presented to [facility] ED with increased resp distress and a CXR suggestive of multilobar PNA. She was intubated by PCCM in the ED and a new #6 Shiley trach was placed.  She was transferred to ICU on full vent support.  Significant Events: 12/3 admit to ICU under PCCM - intubated in ED - #6 Shiley placed by [facility]  12/6 MS altered. Not focal. EEG c/w status. AEDs started. Neuro following.  12/7 [facility] assumed attending role - PCCM still following  Subjective: The patient's eyes are open today.  She does not communicate or interact with the examiner.  She does not appear to be in acute distress.  There is no evidence of uncontrolled pain.  Assessment & Plan:  New onset Seizure activity / Status  Medication titration per Neurology who is actively following - seizure activity appears to be ongoing per Neuro - consideration is being given to transfer to a tertiary care facility   Acute hypoxic respiratory failure - Pseudomonas and MSSA multilobar HCAP ongoing respiratory care per PCCM - stopped abx after 7 day course (last dose 12/09) as evidence of active infection has waned and abx could be contributing to seizure activity   Hyperglycemia  A1c not c/w true DM at 6.0 - CBG controlled at present   Acute Encephalopathy  -Viral Encephalopathy 11/2014 with contracture of left arm, left leg, expressive aphasia  -had reportedly made signif improvements w/ ongoing rehab effors, but then developed worsening mental status 12/3 requiring intubation -appears to be due to combination of acute infection (PNA) as well as seizure activity   Hypernatremia Sodium trending down with increased free water via PEG  tube - continue and follow  Hypokalemia  Continue to replace and follow - Mg is normal   Bilateral upper extremity contractures Therapy is following   MRSA screen +  DVT prophylaxis: SQ heparin  Code Status: FULL CODE Family Communication: Spoke at length with patient's husband, daughter, and son Disposition Plan: SDU   Consultants:  PCCM Neurology   Antimicrobials:  Cefepime 12/7 > 12/9 Fortaz 12/6 > 12/7 Zosyn 12/3 > 12/6 Vancomycin 12/3 > 12/6  Objective: Blood pressure 110/77, pulse 80, temperature 98.2 F (36.8 C), temperature source Axillary, resp. rate (!) 25, height '5\' 6"'$  (1.676 m), weight 56.6 kg (124 lb 12.5 oz), last menstrual period 12/01/2010, SpO2 95 %.  Intake/Output Summary (Last 24 hours) at 01/04/16 1431 Last data filed at 01/04/16 1223  Gross per 24 hour  Intake             3410 ml  Output                0 ml  Net             3410 ml   Filed Weights   01/02/16 0433 01/03/16 0337 01/04/16 0400  Weight: 55.1 kg (121 lb 7.6 oz) 54.1 kg (119 lb 4.3 oz) 56.6 kg (124 lb 12.5 oz)    Examination: General: No acute respiratory distress on vent via trach  Lungs: Clear to auscultation bilaterally w/ exception to some mild crackles in B bases  Cardiovascular: Regular rate and rhythm without murmur  Abdomen: Nondistended, soft, bowel sounds positive, no rebound,  no ascites - PEG insertion clean and dry  Extremities: No significant edema bilateral lower extremities  CBC:  Recent Labs Lab 12/29/15 0214 12/30/15 0442 01/02/16 0500 01/03/16 0257 01/04/16 0137  WBC 18.5* 14.1* 8.1 6.8 7.5  NEUTROABS  --  11.6*  --   --   --   HGB 11.5* 11.1* 11.9* 11.5* 11.0*  HCT 34.6* 33.0* 37.5 36.2 35.1*  MCV 86.1 85.3 89.1 89.6 89.3  PLT 281 317 409* 410* 390   Basic Metabolic Panel:  Recent Labs Lab 12/29/15 1229 12/29/15 1756 12/30/15 0442 12/30/15 1657 12/30/15 1932 12/31/15 0236 01/02/16 0500 01/03/16 0257 01/04/16 0137  NA  --   --  134*  --   136 138 147* 146* 140  K  --   --  2.9*  --  3.7 3.6 3.0* 3.3* 4.9  CL  --   --  97*  --  99* 96* 103 105 104  CO2  --   --  28  --  27 30 32 35* 28  GLUCOSE  --   --  133*  --  130* 120* 131* 115* 116*  BUN  --   --  11  --  12 13 26* 21* 17  CREATININE  --   --  0.35*  --  0.38* 0.43* 0.40* 0.32* 0.30*  CALCIUM  --   --  8.1*  --  8.3* 8.9 9.0 8.8* 8.6*  MG 1.7 1.7 1.8 2.5*  --   --  2.1 2.1  --   PHOS 3.4 3.5 3.5 3.7  --   --   --  2.6  --    GFR: Estimated Creatinine Clearance: 63.5 mL/min (by C-G formula based on SCr of 0.3 mg/dL (L)).  Liver Function Tests:  Recent Labs Lab 12/28/15 1657 01/03/16 0257  AST 83* 28  ALT 122* 57*  ALKPHOS 109 71  BILITOT 1.0 0.8  PROT 6.3* 6.0*  ALBUMIN 2.5* 2.2*    Coagulation Profile:  Recent Labs Lab 12/28/15 1657  INR 1.14    HbA1C: Hgb A1c MFr Bld  Date/Time Value Ref Range Status  01/02/2016 05:00 AM 6.0 (H) 4.8 - 5.6 % Final    Comment:    (NOTE)         Pre-diabetes: 5.7 - 6.4         Diabetes: >6.4         Glycemic control for adults with diabetes: <7.0     CBG:  Recent Labs Lab 01/03/16 2029 01/03/16 2350 01/04/16 0400 01/04/16 0754 01/04/16 1215  GLUCAP 106* 108* 96 99 87    Recent Results (from the past 240 hour(s))  Blood Culture (routine x 2)     Status: None   Collection Time: 12/28/15 11:15 AM  Result Value Ref Range Status   Specimen Description BLOOD LEFT HAND  Final   Special Requests IN PEDIATRIC BOTTLE 3CC  Final   Culture NO GROWTH 5 DAYS  Final   Report Status 01/02/2016 FINAL  Final  Blood Culture (routine x 2)     Status: None   Collection Time: 12/28/15 11:15 AM  Result Value Ref Range Status   Specimen Description BLOOD RIGHT ANTECUBITAL  Final   Special Requests BOTTLES DRAWN AEROBIC AND ANAEROBIC 5CC  Final   Culture NO GROWTH 5 DAYS  Final   Report Status 01/02/2016 FINAL  Final  Urine culture     Status: None   Collection Time: 12/28/15 12:05 PM  Result Value Ref  Range  Status   Specimen Description URINE, RANDOM  Final   Special Requests NONE  Final   Culture NO GROWTH  Final   Report Status 12/29/2015 FINAL  Final  Pneumocystis smear by DFA     Status: None   Collection Time: 12/28/15  2:00 PM  Result Value Ref Range Status   Specimen Source-PJSRC TRACHEAL ASPIRATE  Final   Pneumocystis jiroveci Ag NEGATIVE  Final    Comment: Performed at [facility]  MRSA PCR Screening     Status: Abnormal   Collection Time: 12/28/15  3:53 PM  Result Value Ref Range Status   MRSA by PCR POSITIVE (A) NEGATIVE Final    Comment:        The GeneXpert MRSA Assay (FDA approved for NASAL specimens only), is one component of a comprehensive MRSA colonization surveillance program. It is not intended to diagnose MRSA infection nor to guide or monitor treatment for MRSA infections. RESULT CALLED TO, READ BACK BY AND VERIFIED WITH: J.PEARIN,RN 12/28/15 '@1930'$  BY V.WILKINS   Culture, respiratory (NON-Expectorated)     Status: None   Collection Time: 12/28/15  4:10 PM  Result Value Ref Range Status   Specimen Description TRACHEAL ASPIRATE  Final   Special Requests Normal  Final   Gram Stain   Final    ABUNDANT WBC PRESENT,BOTH PMN AND MONONUCLEAR RARE GRAM POSITIVE COCCI IN PAIRS RARE GRAM POSITIVE RODS    Culture   Final    FEW STAPHYLOCOCCUS AUREUS FEW PSEUDOMONAS AERUGINOSA    Report Status 12/31/2015 FINAL  Final   Organism ID, Bacteria STAPHYLOCOCCUS AUREUS  Final   Organism ID, Bacteria PSEUDOMONAS AERUGINOSA  Final      Susceptibility   Pseudomonas aeruginosa - MIC*    CEFTAZIDIME 2 SENSITIVE Sensitive     CIPROFLOXACIN <=0.25 SENSITIVE Sensitive     GENTAMICIN <=1 SENSITIVE Sensitive     IMIPENEM 1 SENSITIVE Sensitive     PIP/TAZO 8 SENSITIVE Sensitive     CEFEPIME <=1 SENSITIVE Sensitive     * FEW PSEUDOMONAS AERUGINOSA   Staphylococcus aureus - MIC*    CIPROFLOXACIN <=0.5 SENSITIVE Sensitive     ERYTHROMYCIN >=8 RESISTANT  Resistant     GENTAMICIN <=0.5 SENSITIVE Sensitive     OXACILLIN <=0.25 SENSITIVE Sensitive     TETRACYCLINE <=1 SENSITIVE Sensitive     VANCOMYCIN <=0.5 SENSITIVE Sensitive     TRIMETH/SULFA <=10 SENSITIVE Sensitive     CLINDAMYCIN <=0.25 RESISTANT Resistant     RIFAMPIN <=0.5 SENSITIVE Sensitive     Inducible Clindamycin POSITIVE Resistant     * FEW STAPHYLOCOCCUS AUREUS     Scheduled Meds: . chlorhexidine gluconate (MEDLINE KIT)  15 mL Mouth Rinse BID  . free water  200 mL Per Tube Q4H  . heparin  5,000 Units Subcutaneous Q8H  . insulin aspart  0-9 Units Subcutaneous Q4H  . lacosamide (VIMPAT) IV  200 mg Intravenous Q12H  . levETIRAcetam  1,500 mg Intravenous BID  . mouth rinse  15 mL Mouth Rinse QID  . OXcarbazepine  600 mg Oral BID  . pantoprazole sodium  40 mg Per Tube QHS  . potassium chloride  40 mEq Per Tube BID  . valproate sodium  750 mg Intravenous Q6H   Continuous Infusions: . sodium chloride 10 mL/hr at 01/04/16 0419  . feeding supplement (VITAL AF 1.2 CAL) 1,000 mL (01/04/16 0419)     LOS: 7 days   Cherene Altes, MD [facility] Office  [phone] Pager - Text Page per Shea Evans as per below:  On-Call/Text Page:      [email or web address]      password [email or web address]  If 7PM-7AM, please contact night-coverage [email or web address] Password [email or web address] 01/04/2016, 2:31 PM

## 2016-01-04 NOTE — Progress Notes (Signed)
vLTM EEG disconnected for CT scan. There is skin breakdown at electrode site FP1 FP2 Ground and CZ. Informed nurse

## 2016-01-04 NOTE — Progress Notes (Signed)
[facility] here to patient leaving via stretcher in stable condition and husband in room and informed.

## 2016-01-04 NOTE — Progress Notes (Addendum)
Subjective: Continues to have frequent discharges, occasionally with evolution suggesting seizure.   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 She had not had known seizures between the time of her discharge and her current hospitalization. She has improved on EEG some, but continues to have frequent seizures.   Recommendations: 1) continue Keppra at 1500 mg twice a day 2) Vimpat 200 mg twice a day 3) increase Depakote to 750 every 6 hours 4) Depakote level tomorrow 5) Trileptal 600 mg twice a day 6) CT scan today.  7) will follow.   Roland Rack, MD [facility] [phone]  If 7pm- 7am, please page neurology on call as listed in [place].   Addendum: CT without acute findings. I am uncertain of how aggressive to be with the current EEG pattern. I am hesitant to proceed to continuous drips in someone who is having intermittent episodes. I think that cefipime could be playing a role and this was discontinued yesterday evening. Given the refractory nature of these seizures and complicated history of Ms. Crable, I have recommended transfer to an academic institution at this time. Family is considering it.   Roland Rack, MD [facility] [phone]  If 7pm- 7am, please page neurology on call as listed in [place].

## 2016-01-04 NOTE — Progress Notes (Signed)
Called [facility] to verify status of transport pickup.  They stated dropping one off at [facility] and will be up afterwards.

## 2016-01-04 NOTE — Procedures (Signed)
HISTORY: Continuous video-EEG monitoring performed for 65year old with history of encephalitis, presenting with altered mental status and seizures. ACQUISITION: International 10-20 system for electrode placement; 18 channels with additional eyes linked to ipsilateral ears and EKG. Additional T1-T2 electrodes were used. Continuous video recording obtained.   EEG NUMBER:  MEDICATIONS:  Day 1: LEV, LZP, VPA Day 2: LEV, VPA, LCM Day 3: LEV, VPA, LCM  DAY #1: from 131112/7/17to 073012/8/17 BACKGROUND: An overall medium voltage continuous recording with some spontaneous variability and reactivity. Waking background consisted of a medium voltage 3-7Hz  activity diffusely with sparse superimposed rest of frequencies. There is also a regular delta range activity intermixed with the waking background. There is no evidence of the posterior basic rhythm. Statechanges were present,but no clear sleep architecture was seen. EPILEPTIFORM/PERIODIC ACTIVITY: There were runs of medium voltage rhythmic generalized delta activity (GRDA) throughout the recording which were increased with stimulation. These runs lasted between 2-15 minutes. These were usually self-limited without evolution, however there was subtle ictal evolution a few times, further described below. There were no clinical changes with these runs. Isolated sharp waves were also seen primarily in the left temporal region, maximal T7. SEIZURES: The above-mentioned runs of rhythmic slow activity showed subtle ictal evolution with increase in frequency up to approximately 3 Hz and increased sharp morphology. These typically occur during stimulation and showed no clear clinical change. These occurred at approximately 1324 (improvement following IV lorazepam), 1945, 0020, and then improved for several hours after starting valproate. There was return again at approximately 0544.  EVENTS: Electrographic seizures, described above. EKG: no  significant arrhythmia  DAY #2: from 073012/8/17to 073012/9/17 BACKGROUND: An overall medium voltage continuous recording with some spontaneous variability and reactivity. Waking background consisted of a medium voltage 3-7Hz  activity diffusely with sparse superimposed rest of frequencies. There is also a regular delta range activity intermixed with the waking background. There is no evidence of the posterior basic rhythm. Statechanges were present,but no clear sleep architecture was seen. EPILEPTIFORM/PERIODIC ACTIVITY: There continued to be frequent runs of medium voltage, rhythmic generalized delta activity which were increased with stimulation and sharply contoured at times (GRDA+S). These runs lasted between 2-30 minutes. These initially showed some subtle evolution in the early portion of the recording, however this was not apparent after 1300 with increased medications. SEIZURES: Some subtle evolution was seen with the runs of stimulus-induced rhythmic activity above, improved after 1300 with increasing medications.  EVENTS: none reported EKG: no significant arrhythmia   DAY #3: from 073012/9/17to 073012/10/17 BACKGROUND: An overall medium voltage continuous recording with some spontaneous variability and reactivity. Waking background consisted of a medium voltage 3-7Hz  activity diffusely with sparse superimposed rest of frequencies. There is also a regular delta range activity intermixed with the waking background. There is no evidence of the posterior basic rhythm. Statechanges were present,but no clear sleep architecture was seen. EPILEPTIFORM/PERIODIC ACTIVITY: There continued to be frequent runs of medium voltage, rhythmic generalized delta activity which were increased with stimulation and sharply contoured at times (GRDA+S). These runs lasted between 2-10 minutes. These showed some subtle evolution at times with increased sharp morphology or periodic appearance (GPD),  however no clear clinical change was noted on video. Upon discussion with staff, that patient has some subtle limb movements or gaze deviation at times however. SEIZURES: Subtle evolution was seen in the morphology of discharges during runs of rhythmic or periodic activity. This was less pronounced and shorter in duration from previous days' recordings.  EVENTS: none reported EKG: no  significant arrhythmia  SUMMARY: This was a moderately abnormal continuous video EEG due to the presence of electrographic seizures evolving from stimulus-induced rhythmic or periodic activity (SIRPIDs). These showed some subtle evolution at times, however did improve in duration over the last 24 hours of recording.There is also absence of some normal physiologic rhythms. 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 These can be seen in severe toxic/metabolic disturbances related to use of cephalosporin antibiotics, however this is not a specific finding.

## 2016-01-04 NOTE — Plan of Care (Signed)
Problem: Education: Goal: Knowledge of [facility] Education information/materials will improve Outcome: Progressing Patient has bed at [facility], respiratory told and checked in on patient. As well on-call MD Dr. Hal Hope signed transfer form, spoke to husband in the room and laid eyes on the patient prior to transfer.  Told in report from 1st shift nurse to remove EEG prior to transferring.

## 2016-01-04 NOTE — Progress Notes (Signed)
Husband wants further explanation of wife's neurological condition as it relates to if she is having continual seizures.  He was concerned about the EEG screen display thinking "It looks like continual seizures," unlike before when he could see more activity with turns and her eye movement.  Please call him today.

## 2016-01-04 NOTE — Progress Notes (Signed)
vLTM EEG running

## 2016-01-04 NOTE — Progress Notes (Signed)
Report called to [facility] by 1st shift RN;  [place] to let them know she is ready for transfer truck is being sent called [phone]

## 2016-01-04 NOTE — Progress Notes (Signed)
Report called to [facility] going to [facility], Room B510. Husband made aware.

## 2016-01-04 NOTE — Progress Notes (Signed)
Patient transported to CT and back to room 4E19 without any apparent complications. RT will continue to monitor.

## 2016-01-05 ENCOUNTER — Encounter: Payer: BC Managed Care – PPO | Admitting: Physical Medicine & Rehabilitation

## 2016-01-05 NOTE — Procedures (Signed)
HISTORY: Continuous video-EEG monitoring performed for 65year old with history of encephalitis, presenting with altered mental status and seizures. ACQUISITION: International 10-20 system for electrode placement; 18 channels with additional eyes linked to ipsilateral ears and EKG. Additional T1-T2 electrodes were used. Continuous video recording obtained.   EEG NUMBER:  MEDICATIONS:  Day 1: LEV, LZP, VPA Day 2: LEV, VPA, LCM Day 3: LEV, VPA, LCM Day 4: LEV, VPA, LCM  DAY #1: from 131112/7/17to 073012/8/17 BACKGROUND: An overall medium voltage continuous recording with some spontaneous variability and reactivity. Waking background consisted of a medium voltage 3-7Hz  activity diffusely with sparse superimposed rest of frequencies. There is also a regular delta range activity intermixed with the waking background. There is no evidence of the posterior basic rhythm. Statechanges were present,but no clear sleep architecture was seen. EPILEPTIFORM/PERIODIC ACTIVITY: There were runs of medium voltage rhythmic generalized delta activity (GRDA) throughout the recording which were increased with stimulation. These runs lasted between 2-15 minutes. These were usually self-limited without evolution, however there was subtle ictal evolution a few times, further described below. There were no clinical changes with these runs. Isolated sharp waves were also seen primarily in the left temporal region, maximal T7. SEIZURES: The above-mentioned runs of rhythmic slow activity showed subtle ictal evolution with increase in frequency up to approximately 3 Hz and increased sharp morphology. These typically occur during stimulation and showed no clear clinical change. These occurred at approximately 1324 (improvement following IV lorazepam), 1945, 0020, and then improved for several hours after starting valproate. There was return again at approximately 0544.  EVENTS: Electrographic seizures, described  above. EKG: no significant arrhythmia  DAY #2: from 073012/8/17to 073012/9/17 BACKGROUND: An overall medium voltage continuous recording with some spontaneous variability and reactivity. Waking background consisted of a medium voltage 3-7Hz  activity diffusely with sparse superimposed rest of frequencies. There is also a regular delta range activity intermixed with the waking background. There is no evidence of the posterior basic rhythm. Statechanges were present,but no clear sleep architecture was seen. EPILEPTIFORM/PERIODIC ACTIVITY: There continued to be frequentruns of medium voltage,rhythmic generalized delta activity which were increased with stimulation and sharply contoured at times (GRDA+S). These runs lasted between 2-85minutes. These initially showed some subtle evolution in the early portion of the recording, however this was not apparent after 1300 with increased medications. SEIZURES: Some subtle evolution was seen with the runs of stimulus-induced rhythmic activity above, improved after 1300 with increasing medications. EVENTS: none reported EKG: no significant arrhythmia   DAY #3: from 073012/9/17to 073012/10/17 BACKGROUND: An overall medium voltage continuous recording with some spontaneous variability and reactivity. Waking background consisted of a medium voltage 3-7Hz  activity diffusely with sparse superimposed rest of frequencies. There is also a regular delta range activity intermixed with the waking background. There is no evidence of the posterior basic rhythm. Statechanges were present,but no clear sleep architecture was seen. EPILEPTIFORM/PERIODIC ACTIVITY: There continued to be frequentruns of medium voltage,rhythmic generalized delta activity which were increased with stimulation and sharply contoured at times (GRDA+S). These runs lasted between 2-57minutes. These showed some subtle evolution at times with increased sharp morphology or periodic  appearance (GPD), however no clear clinical change was noted on video. Upon discussion with staff, that patient has some subtle limb movements or gaze deviation at times however. SEIZURES: Subtle evolution was seen in the morphology of discharges during runs of rhythmic or periodic activity. This was less pronounced and shorter in duration from previous days' recordings. EVENTS: none reported EKG: no significant arrhythmia  DAY #4: from 073012/10/17to 212812/10/17 BACKGROUND: An overall medium voltage continuous recording with some spontaneous variability and reactivity. Waking background consisted of a medium voltage 3-7Hz  activity diffusely with sparse superimposed rest of frequencies. There is also a regular delta range activity intermixed with the waking background. There is no evidence of the posterior basic rhythm. Statechanges were present,but no clear sleep architecture was seen. EPILEPTIFORM/PERIODIC ACTIVITY: There continued to be occasionalruns of medium voltage,rhythmic generalized delta activity which were increased with stimulation and sharply contoured at times (GRDA+S). These runs lasted between 2-45minutes. There was no clear ictal evolution to these runs. These were shorter and less frequent compared to previous day's recording. SEIZURES: none EVENTS: none reported EKG: no significant arrhythmia  SUMMARY: This was a moderately abnormal continuous video EEG due to the presence of electrographic seizures evolving from stimulus-induced rhythmic or periodic activity (SIRPIDs). These showed some subtle evolution at times, however did improve in duration over the last 24 hours of recording.There is also absence of some normal physiologic rhythms. 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 These can be seen in severe toxic/metabolic disturbances related to use of cephalosporin antibiotics, however this is not a specific  finding.

## 2016-01-06 ENCOUNTER — Encounter: Payer: BC Managed Care – PPO | Admitting: Occupational Therapy

## 2016-01-06 ENCOUNTER — Ambulatory Visit: Payer: BC Managed Care – PPO | Admitting: Physical Therapy

## 2016-01-08 ENCOUNTER — Ambulatory Visit: Payer: BC Managed Care – PPO | Admitting: Physical Therapy

## 2016-01-08 ENCOUNTER — Encounter: Payer: BC Managed Care – PPO | Admitting: Occupational Therapy

## 2016-01-13 ENCOUNTER — Ambulatory Visit: Payer: BC Managed Care – PPO

## 2016-01-13 ENCOUNTER — Encounter: Payer: BC Managed Care – PPO | Admitting: Occupational Therapy

## 2016-01-13 ENCOUNTER — Ambulatory Visit: Payer: BC Managed Care – PPO | Admitting: Physical Therapy

## 2016-01-15 ENCOUNTER — Encounter: Payer: BC Managed Care – PPO | Admitting: Occupational Therapy

## 2016-01-15 ENCOUNTER — Ambulatory Visit: Payer: BC Managed Care – PPO | Admitting: Physical Therapy

## 2016-01-21 ENCOUNTER — Ambulatory Visit: Payer: BC Managed Care – PPO | Admitting: Occupational Therapy

## 2016-01-21 ENCOUNTER — Ambulatory Visit: Payer: BC Managed Care – PPO | Admitting: Physical Therapy

## 2016-01-21 ENCOUNTER — Ambulatory Visit: Payer: BC Managed Care – PPO

## 2016-01-22 ENCOUNTER — Encounter: Payer: BC Managed Care – PPO | Admitting: Occupational Therapy

## 2016-01-22 ENCOUNTER — Ambulatory Visit: Payer: BC Managed Care – PPO | Admitting: Physical Therapy

## 2016-01-27 ENCOUNTER — Encounter: Payer: Self-pay | Admitting: Occupational Therapy

## 2016-01-27 DIAGNOSIS — M6281 Muscle weakness (generalized): Secondary | ICD-10-CM

## 2016-01-27 NOTE — Therapy (Signed)
[facility] [address] [place], [place], [address] Phone: [phone]   Fax:  [phone]  Occupational Therapy Treatment  Patient Details  Name: Judy Spears MRN: [id number] Date of Birth: 1950/11/24 Referring Provider: Dr. Sharion Dove  Encounter Date: 01/27/2016    Past Medical History:  Diagnosis Date  . Arthritis    lt great toe  . Complication of anesthesia    pt has had headaches post op-did advise to hydra well preop  . Hair loss    right-sided  . History of exercise stress test    a. ETT (12/15) with 12:00 exercise, no ST changes, occasional PACs.   . Hyperlipidemia   . Insomnia   . Migraine headache   . 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 Postmenopausal   . Premature atrial contractions    a. Holter (12/15) with 8% PACs, no atrial runs or atrial fibrillation.     Past Surgical History:  Procedure Laterality Date  . BREAST BIOPSY  01/01/2011   Procedure: BREAST BIOPSY WITH NEEDLE LOCALIZATION;  Surgeon: Rolm Bookbinder, MD;  Location: [facility];  Service: General;  Laterality: Left;  left breast wire localization biopsy  . cataracts right eye    . CESAREAN SECTION  1986  . HERNIA REPAIR  2000   RIH  . opirectinal membrane peel    . RHINOPLASTY  1983    There were no vitals filed for this visit.                              OT Short Term Goals - 01/27/16 1258      OT SHORT TERM GOAL #1   Title Pt and husband will be mod I with HEP - 12/18/2015   Status On-going     OT SHORT TERM GOAL #2   Title Pt will demonstrate adequate postural control for breath support to voice at least 2 words   Status Achieved     OT SHORT TERM GOAL #3   Title Pt will transfer to commode with moderate assistance 25% of the time.   Status On-going     OT SHORT TERM GOAL #4   Title Pt will tolerate positional changes as evidenced by respiration rate and motor  quieting in preparation for funcitonal mobility   Status Achieved           OT Long Term Goals - 01/27/16 1258      OT LONG TERM GOAL #1   Title Pt and husband will be mod I with upgraded HEP - 01/15/2016   Status On-going     OT LONG TERM GOAL #2   Title Pt will demonstrate adequate postural control to support 3 words in supported sitting.   Status On-going     OT LONG TERM GOAL #3   Title Pt will demonstrate ability for low reach with RUE for functional task with  mod facilitation   Status Achieved     OT LONG TERM GOAL #4   Title Pt will wash face with LUE with min a in supported sitting   Status Achieved     OT LONG TERM GOAL #5   Title Pt will release objects with L hand with min faciltation                Plan - 01/27/16 1259    Clinical Impression Statement Pt was emergently hospitalized  several weeks ago and remains in the hospital at this time.  Will discharge pt at this time.    Plan d/c from OT.       Patient will benefit from skilled therapeutic intervention in order to improve the following deficits and impairments:     Visit Diagnosis: Muscle weakness (generalized)    Problem List Patient Active Problem List   Diagnosis Date Noted  . Pneumonia of both lower lobes due to Pseudomonas species ([place])   . Pneumonia of both lower lobes due to methicillin susceptible Staphylococcus aureus (MSSA) ([place])   . Acute respiratory failure with hypoxia ([place])   . Acute encephalopathy   . Seizures ([place])   . Sepsis ([place])   . Hypoxia   . Pain   . HCAP (healthcare-associated pneumonia) 12/28/2015  . Respiratory failure ([place]) 12/28/2015  . Acute tracheobronchitis 12/24/2015  . Tracheostomy tube present ([place])   . Tracheal stenosis   . Chronic respiratory failure with hypoxia ([place])   . 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 Tracheostomy status ([place]) 09/26/2015  . Allergic rhinitis 09/26/2015  . 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 Movement disorder 09/22/2015   . Encephalitis 09/22/2015  . Chest pain 02/07/2014  . Premature atrial contractions 01/13/2014  . Abnormality of gait 12/04/2013  . Hyperlipidemia 12/21/2011  . Cardiovascular risk factor 12/21/2011  . Bunion 01/31/2008  . Metatarsalgia of both feet 09/20/2007  . FLAT FOOT 09/20/2007  . HALLUX RIGIDUS, ACQUIRED 09/20/2007    Quay Burow, OTR/L 01/27/2016, 1:00 PM  [facility] [address] [place], [place], [address] Phone: [phone]   Fax:  [phone]  Name: Judy Spears MRN: [id number] Date of Birth: February 28, 1950

## 2016-01-29 ENCOUNTER — Encounter: Payer: Self-pay | Admitting: Rehabilitation

## 2016-01-29 NOTE — Therapy (Signed)
[facility] [address] [place], [place], [address] Phone: [phone]   Fax:  [phone]  Patient Details  Name: Judy Spears MRN: [id number] Date of Birth: 1950/10/20 Referring Provider:  No ref. provider found  Encounter Date: 01/29/2016    PHYSICAL THERAPY DISCHARGE SUMMARY  Visits from Start of Care: 40  Current functional level related to goals / functional outcomes: Unsure of current status as pt was hospitalized and transferred to [facility].     Remaining deficits:     PT Long Term Goals - 11/19/15 2034      PT LONG TERM GOAL #1   Title Pt will consistently perform supine <> sit with supervision, 25% cueing to indicate increased independence getting out of bed.   (Target: 01/14/16)   Time 8   Period Weeks   Status New     PT LONG TERM GOAL #2   Title Pt will demonstrate midline posture in static sitting with BLE support with subtle to min cueing to indicate increased postural awareness/control. (01/14/16)   Time 8   Period Weeks   Status New     PT LONG TERM GOAL #3   Title Pt will consistently perform level transfers from w/c <> mat table with min A, 25% cueing to decrease caregiver burden.  (01/14/16)   Time 8   Period Weeks   Status New     PT LONG TERM GOAL #4   Title Pt will consistently perform sit <> stand from personal w/c with BUE support and min A, 25% cueing to decrease caregiver burden.  (01/14/16)   Time 8   Period Weeks   Status New     PT LONG TERM GOAL #5   Title PT will assess gait to determine if ambulation is safe/functional.  (01/14/16)   Time 8   Period Weeks   Status New        Education / Equipment: Continued education and stretching program given to husband and caregiver.   Plan: Patient agrees to discharge.  Patient goals were not met. Patient is being discharged due to a change in medical status.  ?????         Cameron Sprang, PT, MPT [facility] [address] [place], [place], [address] Phone: [phone]   Fax:  [phone] 01/29/16, 8:19 AM

## 2016-02-03 ENCOUNTER — Inpatient Hospital Stay (HOSPITAL_COMMUNITY): Payer: BC Managed Care – PPO

## 2016-02-03 ENCOUNTER — Inpatient Hospital Stay (HOSPITAL_COMMUNITY)
Admission: RE | Admit: 2016-02-03 | Discharge: 2016-02-25 | DRG: 052 | Disposition: A | Discharge status: Home / Self Care | Payer: BC Managed Care – PPO | Source: Other Acute Inpatient Hospital | Attending: Physical Medicine & Rehabilitation | Admitting: Physical Medicine & Rehabilitation

## 2016-02-03 ENCOUNTER — Encounter: Payer: Self-pay | Admitting: *Deleted

## 2016-02-03 ENCOUNTER — Encounter (HOSPITAL_COMMUNITY): Payer: Self-pay | Admitting: *Deleted

## 2016-02-03 DIAGNOSIS — K5900 Constipation, unspecified: Secondary | ICD-10-CM | POA: Diagnosis present

## 2016-02-03 DIAGNOSIS — R21 Rash and other nonspecific skin eruption: Secondary | ICD-10-CM | POA: Diagnosis not present

## 2016-02-03 DIAGNOSIS — J9621 Acute and chronic respiratory failure with hypoxia: Secondary | ICD-10-CM | POA: Diagnosis not present

## 2016-02-03 DIAGNOSIS — R1312 Dysphagia, oropharyngeal phase: Secondary | ICD-10-CM

## 2016-02-03 DIAGNOSIS — G8250 Quadriplegia, unspecified: Principal | ICD-10-CM | POA: Diagnosis present

## 2016-02-03 DIAGNOSIS — R05 Cough: Secondary | ICD-10-CM

## 2016-02-03 DIAGNOSIS — E871 Hypo-osmolality and hyponatremia: Secondary | ICD-10-CM | POA: Diagnosis not present

## 2016-02-03 DIAGNOSIS — J9601 Acute respiratory failure with hypoxia: Secondary | ICD-10-CM | POA: Diagnosis not present

## 2016-02-03 DIAGNOSIS — Z8661 Personal history of infections of the central nervous system: Secondary | ICD-10-CM | POA: Diagnosis not present

## 2016-02-03 DIAGNOSIS — Z91048 Other nonmedicinal substance allergy status: Secondary | ICD-10-CM

## 2016-02-03 DIAGNOSIS — R252 Cramp and spasm: Secondary | ICD-10-CM | POA: Diagnosis present

## 2016-02-03 DIAGNOSIS — Z931 Gastrostomy status: Secondary | ICD-10-CM

## 2016-02-03 DIAGNOSIS — D62 Acute posthemorrhagic anemia: Secondary | ICD-10-CM | POA: Diagnosis not present

## 2016-02-03 DIAGNOSIS — R490 Dysphonia: Secondary | ICD-10-CM | POA: Diagnosis present

## 2016-02-03 DIAGNOSIS — J9811 Atelectasis: Secondary | ICD-10-CM | POA: Diagnosis not present

## 2016-02-03 DIAGNOSIS — R5381 Other malaise: Secondary | ICD-10-CM | POA: Diagnosis not present

## 2016-02-03 DIAGNOSIS — Z881 Allergy status to other antibiotic agents status: Secondary | ICD-10-CM

## 2016-02-03 DIAGNOSIS — G9340 Encephalopathy, unspecified: Secondary | ICD-10-CM

## 2016-02-03 DIAGNOSIS — Z930 Tracheostomy status: Secondary | ICD-10-CM | POA: Diagnosis not present

## 2016-02-03 DIAGNOSIS — E876 Hypokalemia: Secondary | ICD-10-CM | POA: Diagnosis not present

## 2016-02-03 DIAGNOSIS — R0989 Other specified symptoms and signs involving the circulatory and respiratory systems: Secondary | ICD-10-CM

## 2016-02-03 DIAGNOSIS — R4701 Aphasia: Secondary | ICD-10-CM | POA: Diagnosis present

## 2016-02-03 DIAGNOSIS — R471 Dysarthria and anarthria: Secondary | ICD-10-CM | POA: Diagnosis present

## 2016-02-03 DIAGNOSIS — J9611 Chronic respiratory failure with hypoxia: Secondary | ICD-10-CM | POA: Diagnosis not present

## 2016-02-03 DIAGNOSIS — R140 Abdominal distension (gaseous): Secondary | ICD-10-CM

## 2016-02-03 DIAGNOSIS — R059 Cough, unspecified: Secondary | ICD-10-CM

## 2016-02-03 DIAGNOSIS — J189 Pneumonia, unspecified organism: Secondary | ICD-10-CM | POA: Diagnosis not present

## 2016-02-03 MED ORDER — FLEET ENEMA 7-19 GM/118ML RE ENEM
1.0000 | ENEMA | Freq: Once | RECTAL | Status: DC | PRN
Start: 2016-02-03 — End: 2016-02-25
  Filled 2016-02-03: qty 1

## 2016-02-03 MED ORDER — NON FORMULARY: 6.0000 mg | Freq: Every day | Status: DC

## 2016-02-03 MED ORDER — AMLODIPINE BESYLATE 5 MG PO TABS
5.0000 mg | ORAL_TABLET | Freq: Every day | ORAL | Status: DC
  Administered 2016-02-04 – 2016-02-25 (×22): 5 mg
  Filled 2016-02-03 (×22): qty 1

## 2016-02-03 MED ORDER — ALBUTEROL SULFATE (2.5 MG/3ML) 0.083% IN NEBU
2.5000 mg | INHALATION_SOLUTION | RESPIRATORY_TRACT | Status: DC | PRN
  Administered 2016-02-05 – 2016-02-20 (×7): 2.5 mg via RESPIRATORY_TRACT
  Filled 2016-02-03 (×7): qty 3

## 2016-02-03 MED ORDER — ENOXAPARIN SODIUM 40 MG/0.4ML ~~LOC~~ SOLN
40.0000 mg | SUBCUTANEOUS | Status: DC
  Administered 2016-02-03 – 2016-02-24 (×22): 40 mg via SUBCUTANEOUS
  Filled 2016-02-03 (×23): qty 0.4

## 2016-02-03 MED ORDER — NYSTATIN 100000 UNIT/GM EX POWD
Freq: Three times a day (TID) | CUTANEOUS | Status: DC
  Administered 2016-02-03 – 2016-02-24 (×54): via TOPICAL
  Filled 2016-02-03: qty 15

## 2016-02-03 MED ORDER — FREE WATER
300.0000 mL | Freq: Every day | Status: DC
  Administered 2016-02-03 – 2016-02-14 (×53): 300 mL
  Administered 2016-02-14 (×2): 150 mL
  Administered 2016-02-14 – 2016-02-15 (×2): 300 mL
  Administered 2016-02-15: 200 mL
  Administered 2016-02-15 – 2016-02-16 (×5): 300 mL

## 2016-02-03 MED ORDER — AMANTADINE HCL 50 MG/5ML PO SYRP
150.0000 mg | ORAL_SOLUTION | Freq: Two times a day (BID) | ORAL | Status: DC
  Administered 2016-02-03 – 2016-02-04 (×2): 150 mg via ORAL
  Filled 2016-02-03 (×2): qty 15

## 2016-02-03 MED ORDER — DIAZEPAM 5 MG PO TABS
5.0000 mg | ORAL_TABLET | Freq: Every day | ORAL | Status: DC
  Administered 2016-02-03: 5 mg via ORAL
  Filled 2016-02-03: qty 1

## 2016-02-03 MED ORDER — ALUM & MAG HYDROXIDE-SIMETH 200-200-20 MG/5ML PO SUSP: 30.0000 mL | ORAL | Status: DC | PRN

## 2016-02-03 MED ORDER — GUAIFENESIN 100 MG/5ML PO SOLN
10.0000 mL | Freq: Four times a day (QID) | ORAL | Status: DC
  Administered 2016-02-03 – 2016-02-25 (×86): 200 mg
  Filled 2016-02-03: qty 25
  Filled 2016-02-03: qty 10
  Filled 2016-02-03: qty 25
  Filled 2016-02-03 (×5): qty 10
  Filled 2016-02-03: qty 5
  Filled 2016-02-03: qty 10
  Filled 2016-02-03: qty 5
  Filled 2016-02-03 (×4): qty 10
  Filled 2016-02-03: qty 25
  Filled 2016-02-03: qty 10
  Filled 2016-02-03 (×2): qty 5
  Filled 2016-02-03 (×7): qty 10
  Filled 2016-02-03: qty 25
  Filled 2016-02-03: qty 10
  Filled 2016-02-03: qty 5
  Filled 2016-02-03 (×2): qty 25
  Filled 2016-02-03: qty 5
  Filled 2016-02-03 (×5): qty 10
  Filled 2016-02-03: qty 5
  Filled 2016-02-03: qty 10
  Filled 2016-02-03: qty 5
  Filled 2016-02-03 (×3): qty 10
  Filled 2016-02-03: qty 50
  Filled 2016-02-03 (×3): qty 10
  Filled 2016-02-03: qty 25
  Filled 2016-02-03: qty 5
  Filled 2016-02-03 (×2): qty 10
  Filled 2016-02-03: qty 5
  Filled 2016-02-03 (×3): qty 10
  Filled 2016-02-03: qty 5
  Filled 2016-02-03 (×5): qty 10
  Filled 2016-02-03: qty 5
  Filled 2016-02-03 (×2): qty 10
  Filled 2016-02-03: qty 50
  Filled 2016-02-03: qty 10
  Filled 2016-02-03: qty 5
  Filled 2016-02-03 (×3): qty 10
  Filled 2016-02-03: qty 5
  Filled 2016-02-03 (×3): qty 10
  Filled 2016-02-03 (×2): qty 5
  Filled 2016-02-03: qty 10
  Filled 2016-02-03: qty 5
  Filled 2016-02-03 (×5): qty 10
  Filled 2016-02-03: qty 5
  Filled 2016-02-03 (×2): qty 10
  Filled 2016-02-03: qty 5
  Filled 2016-02-03 (×2): qty 10
  Filled 2016-02-03: qty 25

## 2016-02-03 MED ORDER — DIAZEPAM 2 MG PO TABS
2.0000 mg | ORAL_TABLET | Freq: Three times a day (TID) | ORAL | Status: DC
  Administered 2016-02-03 – 2016-02-14 (×40): 2 mg
  Filled 2016-02-03 (×40): qty 1

## 2016-02-03 MED ORDER — PRO-STAT SUGAR FREE PO LIQD
30.0000 mL | Freq: Two times a day (BID) | ORAL | Status: DC
  Administered 2016-02-03 – 2016-02-04 (×2): 30 mL
  Filled 2016-02-03 (×2): qty 30

## 2016-02-03 MED ORDER — POLYETHYLENE GLYCOL 3350 17 G PO PACK: 17.0000 g | PACK | Freq: Two times a day (BID) | ORAL | Status: DC | PRN

## 2016-02-03 MED ORDER — VALACYCLOVIR HCL 500 MG PO TABS
500.0000 mg | ORAL_TABLET | Freq: Every day | ORAL | Status: DC
  Administered 2016-02-04 – 2016-02-25 (×22): 500 mg via ORAL
  Filled 2016-02-03 (×22): qty 1

## 2016-02-03 MED ORDER — PROCHLORPERAZINE MALEATE 5 MG PO TABS
5.0000 mg | ORAL_TABLET | Freq: Four times a day (QID) | ORAL | Status: DC | PRN
  Administered 2016-02-12: 5 mg
  Administered 2016-02-13 – 2016-02-17 (×2): 10 mg
  Administered 2016-02-24: 5 mg
  Filled 2016-02-03 (×2): qty 2
  Filled 2016-02-03: qty 1

## 2016-02-03 MED ORDER — BACLOFEN 1 MG/ML ORAL SUSPENSION
10.0000 mg | Freq: Four times a day (QID) | ORAL | Status: DC
  Administered 2016-02-03 – 2016-02-25 (×86): 10 mg
  Filled 2016-02-03 (×88): qty 1

## 2016-02-03 MED ORDER — SODIUM CHLORIDE 3 % IN NEBU
4.0000 mL | INHALATION_SOLUTION | Freq: Every day | RESPIRATORY_TRACT | Status: AC
  Administered 2016-02-04 – 2016-02-06 (×3): 4 mL via RESPIRATORY_TRACT
  Filled 2016-02-03 (×3): qty 4

## 2016-02-03 MED ORDER — GUAIFENESIN-DM 100-10 MG/5ML PO SYRP: 5.0000 mL | ORAL_SOLUTION | Freq: Four times a day (QID) | ORAL | Status: DC | PRN

## 2016-02-03 MED ORDER — FLORANEX PO PACK
1.0000 g | PACK | Freq: Three times a day (TID) | ORAL | Status: DC
  Administered 2016-02-03 – 2016-02-04 (×2): 1 g
  Filled 2016-02-03 (×7): qty 1

## 2016-02-03 MED ORDER — PROCHLORPERAZINE EDISYLATE 5 MG/ML IJ SOLN: 5.0000 mg | Freq: Four times a day (QID) | INTRAMUSCULAR | Status: DC | PRN

## 2016-02-03 MED ORDER — MELATONIN 3 MG PO TABS
6.0000 mg | ORAL_TABLET | Freq: Every day | ORAL | Status: DC
  Administered 2016-02-03 – 2016-02-24 (×22): 6 mg
  Filled 2016-02-03 (×22): qty 2

## 2016-02-03 MED ORDER — PROCHLORPERAZINE 25 MG RE SUPP: 12.5000 mg | Freq: Four times a day (QID) | RECTAL | Status: DC | PRN

## 2016-02-03 MED ORDER — ATORVASTATIN CALCIUM 20 MG PO TABS
20.0000 mg | ORAL_TABLET | Freq: Every day | ORAL | Status: DC
  Administered 2016-02-03 – 2016-02-24 (×22): 20 mg via ORAL
  Filled 2016-02-03 (×21): qty 1

## 2016-02-03 MED ORDER — OSMOLITE 1.5 CAL PO LIQD
1000.0000 mL | Freq: Every day | ORAL | Status: DC
  Administered 2016-02-03 – 2016-02-06 (×7): 1000 mL
  Filled 2016-02-03 (×9): qty 1000

## 2016-02-03 MED ORDER — BISACODYL 10 MG RE SUPP: 10.0000 mg | Freq: Every day | RECTAL | Status: DC | PRN

## 2016-02-03 MED ORDER — ACETAMINOPHEN 325 MG PO TABS
325.0000 mg | ORAL_TABLET | ORAL | Status: DC | PRN
  Administered 2016-02-04 – 2016-02-24 (×12): 650 mg via ORAL
  Filled 2016-02-03 (×12): qty 2

## 2016-02-03 MED ORDER — DIPHENHYDRAMINE HCL 12.5 MG/5ML PO ELIX: 12.5000 mg | ORAL_SOLUTION | Freq: Four times a day (QID) | ORAL | Status: DC | PRN

## 2016-02-03 NOTE — Consult Note (Signed)
Name: Judy Spears MRN: [id number] DOB: [birth year]    ADMISSION DATE:  02/03/2016 CONSULTATION DATE:  02/03/16  REFERRING MD :  [name]  CHIEF COMPLAINT:  [id number]   HISTORY OF PRESENT ILLNESS:  Pt is aphasic; therefore, this HPI is obtained from chart review and family at bedside.  Judy Spears is a 66 y.o. 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  She initially had rehabilitation at [facility] in [place] and then returned to [place].  She had trach removed in May 2017 but unfortunately had complications of laryngospasm and had to have trach re-done 2 weeks later followed by tracheal stenosis partial resection and #4 cuffed tracheostomy placed by ENT in [facility] in June 2017.  Not long after that she was transferred back to her home city of [place] where she participated in ongoing rehabilitation. Pulmonary medicine was consulted for further evaluation. By September 2017 she had been maintained with her tracheostomy and a cuff-less state, capped 24 hours a day for several months. After an initial period of observation in [place] we electively decannulate her in October 2017. She did well for 6 weeks from a respiratory standpoint until she came down with a viral illness in late November 2017. Initially she tolerated this well but unfortunately not long afterwards she developed pneumonia with Pseudomonas and staph requiring recannulation with a tracheostomy and mechanical ventilation. She was treated with cefepime and electroencephalographic studies at our center were worrisome for status epilepticus. She was transferred to [facility] for further management. There was felt that the electrical activity was not consistent with epilepsy and antiepileptic medicines were eventually held. There she  was treated for Klebsiella and Pseudomonas pneumonia with vancomycin, Zosyn, and meropenem. Most recent course of antibiotics ended on 01/25/2016. She was also treated with C. difficile treatment (oral Flagyl and vancomycin) through 01/31/2016. She was also found to have pyelonephritis believed to be due to Candida and was treated with fluconazole through 02/01/2016.  She was treated with mechanical ventilation through 01/26/2016 and has been transitioned to a collar device since then. She has used a speaking valve some since then. She has had persistent mucus production and is able to cough it out around her #6 cuffed trach with the cuff deflated.  She still has thick brown secretions.  A CT scan performed on 01/25/2016 showed a left lower lobe pneumonia and likely pyelonephritis and evidence of diarrhea. There was also nonspecific pancreatic duct enlargement.  Since initial diagnosis, she has had complicated course; most recently over the past couple of months.      PAST MEDICAL HISTORY :   has a past medical history of Arthritis; Complication of anesthesia; Hair loss; History of exercise stress test; Hyperlipidemia; Insomnia; Migraine headache; Movement disorder; Postmenopausal; and Premature atrial contractions.  has a past surgical history that includes Hernia repair (2000); Rhinoplasty (1983); Cesarean section (1986); Breast biopsy (01/01/2011); opirectinal membrane peel; and cataracts right eye. Prior to Admission medications   Medication Sig Start Date End Date Taking? Authorizing Provider  acetaminophen (TYLENOL) 160 MG/5ML solution Place 20.3 mLs (650 mg total) into feeding tube every 6 (six) hours as needed for mild pain, headache or fever. 01/04/16  Yes Cherene Altes, MD  amantadine (SYMMETREL) 50 MG/5ML solution Place 150 mg into feeding tube 2 (two) times daily. In am and at  noon   Yes Historical Provider, MD  amLODipine (NORVASC) 5 MG tablet Take 5 mg by mouth daily.   Yes  Historical Provider, MD  atorvastatin (LIPITOR) 20 MG tablet Place 20 mg into feeding tube daily.   Yes Historical Provider, MD  baclofen (LIORESAL) 10 MG tablet Place 10 mg into feeding tube every 6 (six) hours.   Yes Historical Provider, MD  chlorhexidine gluconate, MEDLINE KIT, (PERIDEX) 0.12 % solution 15 mLs by Mouth Rinse route 2 (two) times daily. 01/04/16  Yes Cherene Altes, MD  clonazePAM (KLONOPIN) 0.5 MG tablet Take 0.25-0.5 mg by mouth See admin instructions. 0.45m 0400, 0.514m2000   Yes Historical Provider, MD  Coenzyme Q10 (CO Q 10 PO) Take 1 tablet by mouth at bedtime.   Yes Historical Provider, MD  diazepam (VALIUM) 2 MG tablet Place 2 mg into feeding tube every 6 (six) hours.   Yes Historical Provider, MD  diazepam (VALIUM) 5 MG tablet Place 5 mg into feeding tube every evening.   Yes Historical Provider, MD  docusate (COLACE) 60 MG/15ML syrup Place 60 mg into feeding tube daily.   Yes Historical Provider, MD  fentaNYL (SUBLIMAZE) 100 MCG/2ML injection Inject 0.5 mLs (25 mcg total) into the vein every 2 (two) hours as needed for severe pain. 01/04/16  Yes JeCherene AltesMD  fluticasone (FLONASE) 50 MCG/ACT nasal spray Place 1 spray into both nostrils 2 (two) times daily.   Yes Historical Provider, MD  heparin 5000 UNIT/ML injection Inject 1 mL (5,000 Units total) into the skin every 8 (eight) hours. 01/04/16  Yes JeCherene AltesMD  lacosamide 200 mg in sodium chloride 0.9 % 25 mL Inject 200 mg into the vein every 12 (twelve) hours. 01/04/16  Yes JeCherene AltesMD  Lactobacillus (LEsau GrewPACK Take 1 each by mouth 3 (three) times daily.   Yes Historical Provider, MD  levETIRAcetam 1,500 mg in sodium chloride 0.9 % 100 mL Inject 1,500 mg into the vein 2 (two) times daily. 01/04/16  Yes JeCherene AltesMD  loratadine (CLARITIN) 10 MG tablet Take 10 mg by mouth daily.   Yes Historical Provider, MD  LORazepam (ATIVAN) 2 MG/ML injection Inject 0.5-1 mLs (1-2 mg total)  into the vein every 15 (fifteen) minutes as needed for seizure (Notify MD/Neurology if needed/given). 01/04/16  Yes JeCherene AltesMD  Melatonin 3 MG TABS Place 6 mg into feeding tube every evening.   Yes Historical Provider, MD  mouth rinse LIQD solution 15 mLs by Mouth Rinse route QID. 01/05/16  Yes JeCherene AltesMD  Nutritional Supplements (FEEDING SUPPLEMENT, VITAL AF 1.2 CAL,) LIQD Place 1,000 mLs into feeding tube continuous. 01/04/16  Yes JeCherene AltesMD  nystatin (MYCOSTATIN/NYSTOP) powder Apply 1 g topically 2 (two) times daily as needed.   Yes Historical Provider, MD  Oxcarbazepine (TRILEPTAL) 600 MG tablet Take 1 tablet (600 mg total) by mouth 2 (two) times daily. 01/04/16  Yes JeCherene AltesMD  pantoprazole sodium (PROTONIX) 40 mg/20 mL PACK Place 20 mLs (40 mg total) into feeding tube at bedtime. 01/04/16  Yes JeCherene AltesMD  sodium chloride 0.9 % infusion Inject 10 mLs into the vein continuous. 01/04/16  Yes JeCherene AltesMD  traZODone (DESYREL) 50 MG tablet Take 50 mg by mouth at bedtime. May take additional 5052mt 1200 prn   Yes Historical Provider, MD  valACYclovir (VALTREX) 500 MG tablet Place 500 mg into feeding tube daily.   Yes  Historical Provider, MD  valproate 750 mg in dextrose 5 % 50 mL Inject 750 mg into the vein every 6 (six) hours. 01/04/16  Yes Cherene Altes, MD  Water For Irrigation, Sterile (FREE WATER) SOLN Place 200 mLs into feeding tube every 6 (six) hours. 01/04/16  Yes Cherene Altes, MD  insulin aspart (NOVOLOG) 100 UNIT/ML injection Inject 0-9 Units into the skin every 4 (four) hours. Patient not taking: Reported on 02/03/2016 01/04/16   Cherene Altes, MD   Allergies  Allergen Reactions  . Pollen Extract Other (See Comments)    Running nose  . Tetracyclines & Related     Blisters, photosensitivity    FAMILY HISTORY:  family history includes Colon cancer in her father; Heart attack (age of onset: 42) in her  maternal grandfather; Hypertension in her mother; Prostate cancer in her father; Stroke in her mother; Stroke (age of onset: 35) in her maternal uncle. SOCIAL HISTORY:  reports that she has never smoked. She has never used smokeless tobacco. She reports that she drinks alcohol. She reports that she does not use drugs.  REVIEW OF SYSTEMS:   Cannot obtain due to inability to communicate   SUBJECTIVE:   VITAL SIGNS: Temp:  [97 F (36.1 C)] 97 F (36.1 C) (01/09 1500) Pulse Rate:  [83] 83 (01/09 1500) Resp:  [17] 17 (01/09 1500) BP: (119)/(68) 119/68 (01/09 1500) SpO2:  [100 %] 100 % (01/09 1635) FiO2 (%):  [35 %] 35 % (01/09 1635) Weight:  [115 lb (52.2 kg)-120 lb (54.4 kg)] 120 lb (54.4 kg) (01/09 1500)  PHYSICAL EXAMINATION: General: Chronically ill appearing female, resting in bed, in NAD. Neuro: Awake, tracks appropriately.  Per family, she has been following commands. HEENT: [place]/AT. PERRL, sclerae anicteric.  Trach C/D/I, #6 cuffed. Cardiovascular: RRR, no M/R/G.  Lungs: Respirations even and unlabored.  Coarse rhonchi bilaterally. Abdomen: PEG in place.  BS x 4, soft, NT/ND.  Musculoskeletal: No gross deformities, no edema.  Skin: Intact, warm, no rashes.    No results for input(s): NA, K, CL, CO2, BUN, CREATININE, GLUCOSE in the last 168 hours. No results for input(s): HGB, HCT, WBC, PLT in the last 168 hours. No results found.  STUDIES:  01/25/2016 CT chest/ab/pelvis [facility]: 1) likely peylo left kidney, 2) diarrhea in colon, 3) Ground glass in RML and RLL likely pneumonia vs aspiration, near complete left lower lobe consolidation with distal airways plugging, 4) compression fractures T1 and L2-L4, 5) 13 mm soft tissue nodule left upper breast 6) mildly dilated pancreatic duct indeterminate etiology with pancreatic atropy  SIGNIFICANT EVENTS    ASSESSMENT / PLAN:  67 year old female with a past medical history significant for [place] virus encephalitis in  November 2016 leading to significant neurologic injury and prolonged rehab course comes back to [facility] rehabilitation after a recent hospitalization for healthcare associated pneumonia leading to respiratory failure, C. difficile, and a neurologic evaluation for possible epilepsy. Fortunately there is no evidence of status epilepticus. Pulmonary and critical care medicine is consulted for assistance with management of pulmonary secretions and tracheostomy.  Healthcare associated pneumonia: Recently treated with IV antibiotics, will check a chest x-ray being cognizant of the fact that it's likely going to be abnormal considering her multiple recent episodes of pneumonia.  However she continues to have ongoing chest congestion and significant respiratory secretions. - Chest x-ray now and again in a.m. - Hold off on antibiotics unless respiratory status changes or chest x-ray findings worsen - Agree with speech  therapy evaluation, eventually will need to repeat swallowing evaluation - check respiratory culture - culture respiratory secretions - continue guaifenesin scheduled - add hypertonic saline daily to facilitate mucus clearance - albuterol prn - add chest pt bid  Tracheostomy status: - Continue tracheostomy care per routine - If her respiratory status remains stable we will change to a #6 cuffed list trach next week  Husband updated bedside by me  Roselie Awkward, MD [facility] Pager: [phone] Cell: [phone] After 3pm or if no response, call [phone]   02/03/2016, 4:58 PM

## 2016-02-03 NOTE — PMR Pre-admission (Signed)
[facility] PMR Admission Coordinator Pre-Admission Assessment  Patient: Judy Spears is an 66 y.o., female MRN: [id number] DOB: 08/12/1950 Height: 5\' 6"  (167.6 cm) Weight: 52.2 kg (115 lb)  Insurance Information HMO:     PPO: yes     PCP:      IPA:      80/20:      OTHER:  PRIMARY: [place] [employer] [place]      Policy#: [id number]      Subscriber: pt CM Name: Jamisa Stanwood      Phone#: [phone]     Fax#: [phone] Pre-Cert#: [id number] approved until 02/11/16 when updates are due      Employer: disabled Benefits:  Phone #: [phone]     Name: 02/02/2016 Eff. Date: 01/26/16     Deduct: $1250      Out of Pocket Max: $4350      Life Max: none CIR: 80%      SNF: 80% 100 days Outpatient: $52 per visit     Co-Pay: visits per medical necessity Home Health: 80%      Co-Pay: visits per medical necessity DME: 80%     Co-Pay: 20% Providers: in network  SECONDARY: none       Medicaid Application Date:       Case Manager:  Disability Application Date:       Case Worker:   Emergency Facilities manager Information    Name Relation Home Work [place] T [place] Starke H Daughter [phone]        Current Medical History  Patient Admitting Diagnosis: Respiratory failure; debility  History of Present Illness:  66 year old with history of [place] 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 She has undergone extensive rehabilitation efforts. She presented to [facility] ED 12/28/2015 with increased respiratory distress and CXR suggestive of multilobar PNA. She was intubated by PCCM in the ED and a new #6 Shiley trach was placed percutaneously via her incompletely healed stoma using bronchoscopic guidance. She was admitted to ICU on full vent support.   Patient was treated for multilobar Health Care associated Pneumonia. Attempts were made  to wean but failed due to persisting tachypnea while on pressure support trials. Sputum cultures revealed  Methicillin susceptible staph aureus and pan sensitive pseudomonas. Zosyn and Vanc were used from 12/3 until 12/6 with transition to cefepime on 12/7 when final culture data was available.  She was transferred to SDU with frequent attempts at weaning continued. On !2/7 it was felt that her mental status had declined and therefore EEG was obtained with capture seizure activity. Neurology was consulted. Numerous antiepileptic drugs were administered and the patient was monitored on continuous EEG but her frequent seizures persisted. Cefepime was discontinued 12/9 having completed 7 full days of therapy coverage due to concern that it could be lowering her seizure threshold. CT scan of head without acute finding. After considering continuous drips for seizure suppression, Neurology suggested that the pt's refractory seizure activity would be best evaluated at a tertiary care center. The neurology team contacted the epilepsy team at [facility] who agreed to accept the patient in transfer.   Patient admitted to [facility] 01/05/2016 . She was started on LTM which showed SIRPIDS but no frank seizures. Her Vimpat was discontinued and EEG carefully monitored.  No seizures were revealed and her remaining  AEDS were slowly weaned off. Patient also dx with coronavirus as well as recurrent pseudomonas PNA. After several courses of antibiotics for her recurrent s/s PNA, her clinical symptoms resolved and pt was weaned from the vent. She continued to show pseudomonas colonization. She had persistent intermittent fevers and leukocytosis and was found to have c diff for which she was treated with PO Vanc/Iv flagyl, Also she has large amount of urine sediment with budding yeast on UA; urine cultures were otherwise negative and she was started on diflucan. During her stay she was found to  be mildly hypertensive and was stared on norvasc.   Patient's medical record from [place] has been reviewed by the rehabilitation admission coordinator and physician.  NIH Stroke scale: Glascow Coma Scale:  Past Medical History  Past Medical History:  Diagnosis Date  . Arthritis    lt great toe  . Complication of anesthesia    pt has had headaches post op-did advise to hydra well preop  . Hair loss    right-sided  . History of exercise stress test    a. ETT (12/15) with 12:00 exercise, no ST changes, occasional PACs.   . Hyperlipidemia   . Insomnia   . Migraine headache   . 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 Postmenopausal   . Premature atrial contractions    a. Holter (12/15) with 8% PACs, no atrial runs or atrial fibrillation.     Family History   family history includes Colon cancer in her father; Heart attack (age of onset: 48) in her maternal grandfather; Hypertension in her mother; Prostate cancer in her father; Stroke in her mother; Stroke (age of onset: 77) in her maternal uncle.  Prior Rehab/Hospitalizations Has the patient had major surgery during 100 days prior to admission? No   Pt initially dx with encephalitis in [place]. Transferred to [facility] acute hospital. Admitted to [facility] in [place] . Was 5 months inpt and 2 months OP. Discharged to home with spouse and caregivers. Has been receiving OP therapy with [facility] until before Thanksgiving.  Current Medications Amantadine Amoxicillin Atorvastatin peridex Clonazepam Co q-10 Docusate flonase Loratadine Valtrex  Patients Current Diet:  PEG feeds. Trach #6 cuffed using PMV with SLP Pta pt was using TF at 125 cc/hr 4pm until 8 am daily via PEG. No po at home reported pta.  Precautions / Restrictions Precautions Precautions: Fall Precautions/Special Needs: Swallowing   Has the patient had 2 or more falls or a fall with injury in the past  year?No  Prior Activity Level Household: wheelchair  level mod assist  Prior Functional Level Self Care: Did the patient need help bathing, dressing, using the toilet or eating?  Dependent  Indoor Mobility: Did the patient need assistance with walking from room to room (with or without device)? Dependent  Stairs: Did the patient need assistance with internal or external stairs (with or without device)? Dependent  Functional Cognition: Did the patient need help planning regular tasks such as shopping or remembering to take medications? Dependent  Home Assistive Devices / Equipment Conversion van with ramp, tilt in space wheelchair, standard wheelchair, collapsible wheelchair, BSC, tub bench with back, hand held shower, STEADY lift, ramp into home, stair lift, hospital bed  PRIOR FUNCTIONAL LEVEL: Prior Device Use: Indicate devices/aids used by the patient prior to current illness, exacerbation or injury? Pt was wheelchair level transfers since leaving [facility] rehab stay. Slept in hospital  bed, transferred with max assist transfers with one caregiver to wheelchair, took stair lift downstairs, used STEDY to transfer to tilt in space downstairs. Has BSC in downstairs office used for transfers to [facility]. To shower, transferred to tub bench from wheelchair, max assist to bathe and then transfers out. Spoke one word communication due to expressive aphasia. Had trach at home until 11/12/2015 when decannulation by MD. Once admitted to [facility] 12/28/2015, re cannulated stoma due to respiratory issues.  Took stool softners in am daily. If no BM in two days, took stool softner in the evening and typically that worked. Did not have indwelling catheter at home. Had briefs.      Prior Functional Level Current Functional Level  Bed Mobility  Max assist.   02/01/16 pt demonstrated ability to complete multiple supine to long sit transfers, with pt assisting with sitting forward using trunk, however  increased difficulty with EOB sitting, requiring mod to max assist of 2 to prevent pt from sliding forward at EOB.LLE contracture preventing pt from shifting weight anterior to sit unsupported on EOB.   Transfers  Max assist. Bed to wheelchair transfers mod to max assist. Used standard w/c upstairs and used STEDY to transfer to tilt in space w/c once downstairs   total assist to transfer to chair with lift   Mobility - Walk/Wheelchair   (pt doing stand pivot until LLE weakness onset two months ago). Spouse reports walk short distances at OP apartment at [facility] last year. Not taking steps in several months   Not attempted. Per MD at [facility], motor 4/5 bilateral biceps, triceps BLE 3/5. Baseline LUE, LLE contracture. Tone is increased throughout L greater than right. Reflexes 2+throughout, plantar responses are flexor bilaterally   Upper Body Dressing  Max assist   increased ability to follow commands with BUE. Limited by tone in BUE, however able to reach almost full extension of right elbow and with increased left elbow ROM to about 90 degrees of extension. Intermittent verbal and tactile cues for maintaining alertness/eye opening. Able to participate in BUE AAROM exercises and grooming tasks with increased assist for incorporating LUE 2/2 tone and assist at right elbow needed for reaching forehead during face washing. Using BUE resting hand splints and pt with JAS splints being alternated with resting hands.    Lower Body Dressing  Max assist   total assist   Grooming  Max assist   max assist to total   Eating/Drinking  PEG feed 4 pm until 8 am daily at 125 cc/hr   PEG feeds   Toilet Transfer  Max assist. Used BSC downstairs in office to stand pivot transfer  Not attempted   Bladder Continence   Wore briefs for incontinence but did BSC transfers  Indwelling catheter   Bowel Management  Stool softners every am and if no BM in two days would take stool softner in the  evening .wore briefs.   incontinent. Rectal tube removed. Treated for cdiff   Stair Climbing  Unable. Ramp entry into home, stair lift to upstairs, conversion van with ramp in the community   (not attempted/unable)   Communication  few words at baseline. Expressive aphasia at baseline. Single word level prior to admission.   PMV with SLP. Patient responded to yes/ no questions via head nod and shake.. Limited vocalization, but did achieve intermittent audible sounds and demonstrate audible coughing. Did not expectorate any secretions from her oral cavity. Pt did not demonstrate inhalation using a respiratory trainer, but  did demonstrate exhalation with respiratory trainer in place. Not intelligible at single word level.    Memory  impaired  impaired   Cooking/Meal Prep  no      Housework  no    Money Management  no    Driving   no      Special needs/care consideration BiPAP/CPAP  N/a CPM  N/a Continuous Drip IV  N/a Dialysis  N/a Life Vest  N/a Oxygen 40 % trach collar Special Bed  N/a Trach Size #6 cuffed trach Wound Vac (area)  N/a Skin sacral area pink, reportedly old healed sacral wound per spouse Bowel mgmt: incontinent. Had been treated for cdiff. Rectal tube since removed Bladder mgmt: indwelling catheter Diabetic mgmt  N/a  Previous Home Environment Living Arrangements: Spouse/significant other  Lives With: Spouse Available Help at Discharge: Family, Personal care attendant, Available 24 hours/day Type of Home: House Home Layout: Two level, Bed/bath upstairs Alternate Level Stairs-Rails:  (chair lift to second floor) Alternate Level Stairs-Number of Steps: [place]: No  Discharge Living Setting Plans for Discharge Living Setting: Patient's home, Lives with (comment) (spouse) Type of Home at Discharge: House Discharge Home Layout: Two level, Bed/bath upstairs (chair lift to second floor) Alternate Level Stairs-Number of Steps: flight  (chsir lift to second floor) Does the patient have any problems obtaining your medications?: No  Social/Family/Support Systems Patient Roles: Spouse, Parent (Former [place] and [place]; Chief Executive Officer) Sport and exercise psychologist Information: [name] Regulatory affairs officer, spouse Anticipated Caregiver: spouse, family and hired caregivers; Conservation officer, historic buildings Information: [phone] cell Ability/Limitations of Caregiver: spouse and [name] caregiver have injured their backs with transfers Caregiver Availability: 24/7 Discharge Plan Discussed with Primary Caregiver: Yes Is Caregiver In Agreement with Plan?: Yes Does Caregiver/Family have Issues with Lodging/Transportation while Pt is in Rehab?: No  Hired caregiver, [name], in the home.  Goals/Additional Needs Patient/Family Goal for Rehab: Max assist with PT, OT, and SLP whellchair level goals Expected length of stay: ELOS 2- 4 weeks Dietary Needs: PEG feeds Special Service Needs: XXX status for privacy per husband's request Pt/Family Agrees to Admission and willing to participate: Yes Program Orientation Provided & Reviewed with Pt/Caregiver Including Roles  & Responsibilities: Yes  Patient Condition: I have reviewed medical records from [place] as well as interviewed Tourist information centre manager and spouse. Pt requires ongoing PT, OT, SLP, Rehabilitative Medicaine and Rehabilitative nursing care. She will benefit from the coordinated approach of the Rehab team. She is currently total assist . We will admit pt to acute inpatient rehab today.   Preadmission Screen Completed By:  Cleatrice Burke, 02/03/2016 1:19 PM ______________________________________________________________________   Discussed status with Dr. [name]  on 02/03/2016  at  1319  and received telephone approval for admission today.  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 Does the need for close, 24 hr/day  Medical supervision in concert with the patient's rehab needs make it unreasonable for this patient to be served in a less intensive setting? Yes 2. Co-Morbidities requiring supervision/potential complications: dysphagia/myoclonus 3. Due to bladder management, bowel management, safety, skin/wound care, disease management, medication administration, pain management and patient education, does the patient require 24 hr/day rehab nursing? Yes 4. Does the patient require coordinated care of a physician, rehab nurse, PT (1-2 hrs/day, 5 days/week), OT (1-2 hrs/day, 5 days/week) and SLP (1-2 hrs/day, 5 days/week) to address physical and functional deficits  in the context of the above medical diagnosis(es)? Yes Addressing deficits in the following areas: balance, endurance, locomotion, strength, transferring, bowel/bladder control, bathing, dressing, feeding, grooming, toileting, cognition, speech, language, swallowing and psychosocial support 5. Can the patient actively participate in an intensive therapy program of at least 3 hrs of therapy 5 days a week? Yes 6. The potential for patient to make measurable gains while on inpatient rehab is excellent 7. Anticipated functional outcomes upon discharge from inpatients are: max assist PT, max assist OT, max assist SLP 8. Estimated rehab length of stay to reach the above functional goals is: 14-28 days 9. Does the patient have adequate social supports to accommodate these discharge functional goals? Yes 10. Anticipated D/C setting: Home 11. Anticipated post D/C treatments: HH therapy and Outpatient therapy 12. Overall Rehab/Functional Prognosis: excellent    RECOMMENDATIONS: This patient's condition is appropriate for continued rehabilitative care in the following setting: CIR Patient has agreed to participate in recommended program. Yes Note that insurance prior authorization may  be required for reimbursement for recommended care.  Comment: admitting to inpatient rehab today  Cleatrice Burke 02/03/2016

## 2016-02-03 NOTE — Progress Notes (Signed)
Pt arrived via EMS transport from [facility]. RN from [facility] gave brief report to RN. PT settled in with husband at bedside. Pt is resting comfortably. Continue plan of care

## 2016-02-03 NOTE — H&P (Signed)
Physical Medicine and Rehabilitation Admission H&P    Chief Complaint  Patient presents with  . Debility due to respiratory failure.     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 Hospital course significant for vent dependence with need for recanalization of trach and decline in MS due to refractory seizures despite multiple AEDs. She was transferred to [facility] on 12/10 for management and monitoring. 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   LTM without classic seizure activity and no clear seizures noted. 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   She continued to have fevers despite completion of PNA treatment and was found to be C diff positive on 12/27 and have candida UTI. She has been treated with diflucan as well as po Vanc/Flagyl. She was weaned to ATC on 01/1 and tolerating PMSV with tachypnea and RR up to 33 but no distress reported. Foley and rectal tube discontinued yesterday. Therapy ongoing and patient noted to be debilitated. CIR recommended for follow up therapy and records reviewed by MD/Rehab coordinator. She was felt to be a food CIR candidate.    Review of Systems  Unable to perform ROS: Patient nonverbal      Past Medical History:  Diagnosis Date  . Arthritis    lt great toe  . Complication of anesthesia    pt has had headaches post op-did advise to hydra well preop  . Hair loss    right-sided  . History of exercise stress test    a. ETT (12/15)  with 12:00 exercise, no ST changes, occasional PACs.   . Hyperlipidemia   . Insomnia   . Migraine headache   . 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 Postmenopausal   . Premature atrial contractions    a. Holter (12/15) with 8% PACs, no atrial runs or atrial fibrillation.     Past Surgical History:  Procedure Laterality Date  . BREAST BIOPSY  01/01/2011   Procedure: BREAST BIOPSY WITH NEEDLE LOCALIZATION;  Surgeon: Rolm Bookbinder, MD;  Location: [place];  Service: General;  Laterality: Left;  left breast wire localization biopsy  . cataracts right eye    . CESAREAN SECTION  1986  . HERNIA REPAIR  2000   RIH  . opirectinal membrane peel    . RHINOPLASTY  1983    Family History  Problem Relation Age of Onset  . Stroke Mother     paralyzed on right side/aphasia  . Hypertension Mother     had stroke after stopping BP meds  . Colon cancer Father   . Prostate cancer Father   . Heart attack Maternal Grandfather 56  . Stroke Maternal Uncle 14    Social History:  Married.  Was attending outpatient PT. Per reports that she has never smoked. She has never used smokeless tobacco. Per  reports that she does not drink alcohol anymore. She reports that she does not use drugs.     Allergies  Allergen Reactions  . Pollen Extract Other (See Comments)    Running nose  .  Tetracyclines & Related     Blisters, photosensitivity    (Not in a hospital admission)  Home: Home Living Living Arrangements: Spouse/significant other Available Help at Discharge: Family, Personal care attendant, Available 24 hours/day Type of Home: House Home Layout: Two level, Bed/bath upstairs Alternate Level Stairs-Number of Steps: flight Alternate Level Stairs-Rails:  (chair lift to second floor)  Lives With: Spouse   Functional History: Prior Function Level of Independence: Needs assistance (Pt max assist transfers to wheelchair with [facility] equipment a) Gait / Transfers  Assistance Needed: max assist +1 for bed mobility. max assist sit to stand with stedy. Patient was doing stand pivot until LLE weakness onset 2 months ago. Can do standing pivot max assist to wheelchair.  ADL's / Homemaking Assistance Needed: max assist with bathing, dressing.  Communication / Swallowing Assistance Needed: patient responds to yes/no questions at home, and can mouth short phrases.   Functional Status:  Mobility: Mod to max assit + 2 for bed mobility and to get to EOB.    ADL: Able to perform wash face and hand with cues and min assist right elbow.  Mod assist for oral hygeine.    Cognition: Able to answer Y/N question.  Able to follow simple one step commands.  Has intermittent audible vocalization.   [name]  Height 5\' 6"  (1.676 m), weight 52.2 kg (115 lb), last menstrual period 12/01/2010. Physical Exam  Nursing note and vitals reviewed. Constitutional: She appears well-developed. She has a sickly appearance.  Thin female with cuffed trach in place. Intermittent congested cough with difficulty mobilizing secretions. Was suctioned by RN with blood tinged secretions.   HENT:  Head: Normocephalic and atraumatic.  Dry oral mucosa --tongue and posterior pharynx with dry flaky material.   Eyes: Conjunctivae are normal. Pupils are equal, round, and reactive to light.  Neck:  #6 cuffed trach. Pt with secretions, fairly thick initially during exam which required suction  Cardiovascular:  Occasional extrasystoles are present. Tachycardia present.   Respiratory: Effort normal. No stridor. No respiratory distress. She has decreased breath sounds in the left lower field. She has no wheezes. She has rhonchi. She exhibits no tenderness.  Rhonchi scattered, more in left fields  GI: Soft. Bowel sounds are normal. She exhibits no distension. There is no tenderness.  PEG site clean and dry.  Musculoskeletal: She exhibits no edema.  Neurological: She is alert.  Made eye contact. Non  verbal with left facial weakness. Did attempt to mouth simple words. Did not make consistent attempt at Y/N response. Significant flexor tone LUE at biceps and brachioradialis 2-3/4. LLE : hamstring hip adductors 3/4. Motor 2-3/5 RUE and RLE with inconsistent effort. LUE and LLE potentially 1-2/5 but blocked by tone. .    Skin: Skin is warm and dry. No rash noted. No erythema.  Psychiatric: She is slowed. She is noncommunicative.  Flat, fatigued initially but was more alert as engaged    No results found for this or any previous visit (from the past 48 hour(s)). No results found.     Medical Problem List and Plan: 1.  Functional, mobility and cognitive/communication deficits secondary to debility in setting of prior encephalopathy/tetraplegia 2.  DVT Prophylaxis/Anticoagulation: Pharmaceutical: Lovenox 3. Pain Management: tylenol prn 4. Mood: LCSW to follow for evaluation when appropriate 5. Neuropsych: This patient is not capable of making decisions on her own behalf. 6. Skin/Wound Care: routine pressure relief measures, Prevalon boots 7. Fluids/Electrolytes/Nutrition: Continue Tube feeds 125 cc from 4 pm to 8 am.  Water  flushes 300 cc/ 5 X day. Will consult  Dietician for input. 8. Spasticity: On baclofen qid and valium qid.   -consider re-trial of klonopin  -splinting, dynamic left elbow, knee.   -ROM with therapy  -botox LUE/LLE while here (last botox 11/05/15) 9. Candida UTI/C diff colitis: has completed treatment.  10 VDRF: Continue cuffed #6 trach. Will add nebulizers prn to help mobilize secretions. Add mucinex to loosen secretions. PMSV with supervision. Pulmonary consulted for input/assistance.     Post Admission Physician Evaluation: 1. Functional deficits secondary  to debilty/encephalopathy. 2. Patient is admitted to receive collaborative, interdisciplinary care between the physiatrist, rehab nursing staff, and therapy team. 3. Patient's level of medical complexity and  substantial therapy needs in context of that medical necessity cannot be provided at a lesser intensity of care such as a SNF. 4. Patient has experienced substantial functional loss from his/her baseline which was documented above under the "Functional History" and "Functional Status" headings.  Judging by the patient's diagnosis, physical exam, and functional history, the patient has potential for functional progress which will result in measurable gains while on inpatient rehab.  These gains will be of substantial and practical use upon discharge  in facilitating mobility and self-care at the household level. 5. Physiatrist will provide 24 hour management of medical needs as well as oversight of the therapy plan/treatment and provide guidance as appropriate regarding the interaction of the two. 6. The Preadmission Screening has been reviewed and patient status is unchanged unless otherwise stated above. 7. 24 hour rehab nursing will assist with bladder management, bowel management, safety, skin/wound care, disease management, medication administration, pain management and patient education  and help integrate therapy concepts, techniques,education, etc. 8. PT will assess and treat for/with: Lower extremity strength, range of motion, stamina, balance, functional mobility, safety, adaptive techniques and equipment, NMR, spasticity mgt, splinting, family/care giver ed.   Goals are: max assist. 9. OT will assess and treat for/with: ADL's, functional mobility, safety, upper extremity strength, adaptive techniques and equipment, NMR, spastiicty mgt, splinting, caregiver ed.   Goals are: mod to max assist. Therapy may not yet proceed with showering this patient. 10. SLP will assess and treat for/with: language, communication, trach mgt, speech, and cognition.  Goals are: mod to max assist. 11. Case Management and Social Worker will assess and treat for psychological issues and discharge planning. 12. Team  conference will be held weekly to assess progress toward goals and to determine barriers to discharge. 13. Patient will receive at least 3 hours of therapy per day at least 5 days per week. 14. ELOS: 18-25 days       15. Prognosis:  good     Meredith Staggers, MD, [facility] 02/03/2016  Bary Leriche, Hershal Coria 02/03/2016

## 2016-02-03 NOTE — Progress Notes (Deleted)
Physical Medicine and Rehabilitation Admission H&P    Chief Complaint  Patient presents with  .    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 Hospital course significant for vent dependence with need for recanalization of trach and decline in MS due to refractory seizures despite multiple AEDs. She was transferred to [facility] on 12/10 for management and monitoring. LTM without classic seizure activity and no clear seizures. 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 She was weaned to ATC on 01/1 and has completed    Patient with tachpnea with RR up to 33 but no distress reported.   ROS Past Medical History:  Diagnosis Date  . Arthritis    lt great toe  . Complication of anesthesia    pt has had headaches post op-did advise to hydra well preop  . Hair loss    right-sided  . History of exercise stress test    a. ETT (12/15) with 12:00 exercise, no ST changes, occasional PACs.   . Hyperlipidemia   . Insomnia   . Migraine headache   . 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 Postmenopausal   . Premature atrial contractions    a. Holter (12/15) with 8% PACs, no atrial runs or atrial fibrillation.    Past Surgical History:  Procedure Laterality Date  . BREAST BIOPSY  01/01/2011   Procedure: BREAST BIOPSY WITH NEEDLE LOCALIZATION;  Surgeon: Rolm Bookbinder, MD;  Location: [place];  Service: General;  Laterality: Left;  left breast wire localization biopsy  . cataracts right eye    . CESAREAN SECTION  1986  . HERNIA REPAIR  2000   RIH   . opirectinal membrane peel    . RHINOPLASTY  1983   Family History  Problem Relation Age of Onset  . Stroke Mother     paralyzed on right side/aphasia  . Hypertension Mother     had stroke after stopping BP meds  . Colon cancer Father   . Prostate cancer Father   . Heart attack Maternal Grandfather 56  . Stroke Maternal Uncle 11   Social History:  reports that she has never smoked. She has never used smokeless tobacco. She reports that she drinks alcohol. She reports that she does not use drugs. Allergies:  Allergies  Allergen Reactions  . Pollen Extract Other (See Comments)    Running nose  . Tetracyclines & Related     Blisters, photosensitivity    (Not in a hospital admission)  Home:     Functional History:    Functional Status:  Mobility:          ADL:    Cognition:      Physical Exam: Last menstrual period 12/01/2010. Physical Exam  No results found for this or any previous visit (from the past 48 hour(s)). No results found.     Medical Problem List and Plan: 1.  *** secondary to *** 2.  DVT Prophylaxis/Anticoagulation: {VTE PROPHYLAXIS/ANTICOAGULATION - TYPE:22655} 3. Pain Management: *** 4. Mood: *** 5. Neuropsych: This patient *** capable of making decisions on *** own behalf. 6. Skin/Wound Care: *** 7. Fluids/Electrolytes/Nutrition: ***  Post Admission Physician Evaluation: 1. Functional deficits secondary  to ***. 2. Patient is admitted to receive collaborative, interdisciplinary care between the physiatrist, rehab nursing staff, and therapy team. 3. Patient's level of medical complexity and substantial therapy needs in context of that medical necessity cannot be provided at a lesser intensity of care such as a SNF. 4. Patient has experienced substantial functional loss from his/her baseline which was documented above under the "Functional History" and "Functional Status" headings.  Judging by the patient's diagnosis, physical  exam, and functional history, the patient has potential for functional progress which will result in measurable gains while on inpatient rehab.  These gains will be of substantial and practical use upon discharge  in facilitating mobility and self-care at the household level. 5. Physiatrist will provide 24 hour management of medical needs as well as oversight of the therapy plan/treatment and provide guidance as appropriate regarding the interaction of the two. 6. The Preadmission Screening has been reviewed and patient status is unchanged unless otherwise stated above. 7. 24 hour rehab nursing will assist with {due RE:[id number]  and help integrate therapy concepts, techniques,education, etc. 8. PT will assess and treat for/with: ***.   Goals are: ***. 9. OT will assess and treat for/with: ***.   Goals are: ***. Therapy *** proceed with showering this patient. 10. SLP will assess and treat for/with: ***.  Goals are: ***. 11. Case Management and Social Worker will assess and treat for psychological issues and discharge planning. 12. Team conference will be held weekly to assess progress toward goals and to determine barriers to discharge. 13. Patient will receive at least 3 hours of therapy per day at least 5 days per week. 14. ELOS: ***       15. Prognosis:  {potential:[id number]}     *** Bary Leriche, PA-C 02/03/2016

## 2016-02-03 NOTE — Progress Notes (Signed)
Judy Gong, RN Rehab Admission Coordinator Cosign Needed Physical Medicine and Rehabilitation  PMR Pre-admission Encounter Date: 02/03/2016 10:34 AM  Related encounter: Documentation from 02/03/2016 in [facility]       [] Hide copied text [] Hover for attribution information   Secondary Market PMR Admission Coordinator Pre-Admission Assessment  Patient: Judy Spears is an 66 y.o., female MRN: [id number] DOB: May 25, 1950 Height: 5\' 6"  (167.6 cm) Weight: 52.2 kg (115 lb)  Insurance Information HMO:     PPO: yes     PCP:      IPA:      80/20:      OTHER:  PRIMARY: [place] [employer] of [place]      Policy#: [id number]      Subscriber: pt CM Name: Marthe Baran      Phone#: [phone]     Fax#: [phone] Pre-Cert#: [id number] approved until 02/11/16 when updates are due      Employer: disabled Benefits:  Phone #: [phone]     Name: 02/02/2016 Eff. Date: 01/26/16     Deduct: $1250      Out of Pocket Max: $4350      Life Max: none CIR: 80%      SNF: 80% 100 days Outpatient: $52 per visit     Co-Pay: visits per medical necessity Home Health: 80%      Co-Pay: visits per medical necessity DME: 80%     Co-Pay: 20% Providers: in network  SECONDARY: none       Medicaid Application Date:       Case Manager:  Disability Application Date:       Case Worker:   Emergency Tax adviser Information    Name Relation Home Work Mila Doce T [place] H Daughter [phone]        Current Medical History  Patient Admitting Diagnosis: Respiratory failure; debility  History of Present Illness:  66 year old with history of [place] 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 She has undergone extensive rehabilitation efforts. She presented to [facility] ED 12/28/2015 with increased respiratory  distress and CXR suggestive of multilobar PNA. She was intubated by PCCM in the ED and a new #6 Shiley trach was placed percutaneously via her incompletely healed stoma using bronchoscopic guidance. She was admitted to ICU on full vent support.   Patient was treated for multilobar Health Care associated Pneumonia. Attempts were made to wean but failed due to persisting tachypnea while on pressure support trials. Sputum cultures revealed  Methicillin susceptible staph aureus and pan sensitive pseudomonas. Zosyn and Vanc were used from 12/3 until 12/6 with transition to cefepime on 12/7 when final culture data was available.  She was transferred to SDU with frequent attempts at weaning continued. On !2/7 it was felt that her mental status had declined and therefore EEG was obtained with capture seizure activity. Neurology was consulted. Numerous antiepileptic drugs were administered and the patient was monitored on continuous EEG but her frequent seizures persisted. Cefepime was discontinued 12/9 having completed 7 full days of therapy coverage due to concern that it could be lowering her seizure threshold. CT scan of head without acute finding. After considering continuous drips for seizure suppression, Neurology suggested that the pt's refractory seizure activity would be best evaluated at a tertiary care center. The neurology team contacted the epilepsy  team at [facility] who agreed to accept the patient in transfer.   Patient admitted to [facility] 01/05/2016 . She was started on LTM which showed SIRPIDS but no frank seizures. Her Vimpat was discontinued and EEG carefully monitored.  No seizures were revealed and her remaining AEDS were slowly weaned off. Patient also dx with coronavirus as well as recurrent pseudomonas PNA. After several courses of antibiotics for her recurrent s/s PNA, her clinical symptoms resolved and pt was weaned from the vent. She continued to  show pseudomonas colonization. She had persistent intermittent fevers and leukocytosis and was found to have c diff for which she was treated with PO Vanc/Iv flagyl, Also she has large amount of urine sediment with budding yeast on UA; urine cultures were otherwise negative and she was started on diflucan. During her stay she was found to be mildly hypertensive and was stared on norvasc.   Patient's medical record from [place] has been reviewed by the rehabilitation admission coordinator and physician.  NIH Stroke scale: Glascow Coma Scale:  Past Medical History      Past Medical History:  Diagnosis Date  . Arthritis    lt great toe  . Complication of anesthesia    pt has had headaches post op-did advise to hydra well preop  . Hair loss    right-sided  . History of exercise stress test    a. ETT (12/15) with 12:00 exercise, no ST changes, occasional PACs.   . Hyperlipidemia   . Insomnia   . Migraine headache   . 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 Postmenopausal   . Premature atrial contractions    a. Holter (12/15) with 8% PACs, no atrial runs or atrial fibrillation.     Family History   family history includes Colon cancer in her father; Heart attack (age of onset: 65) in her maternal grandfather; Hypertension in her mother; Prostate cancer in her father; Stroke in her mother; Stroke (age of onset: 89) in her maternal uncle.  Prior Rehab/Hospitalizations Has the patient had major surgery during 100 days prior to admission? No              Pt initially dx with encephalitis in [place]. Transferred to [facility] acute hospital. Admitted to [facility] in [place] . Was 5 months inpt and 2 months OP. Discharged to home with spouse and caregivers. Has been receiving OP therapy with [facility] until before Thanksgiving.  Current Medications Amantadine Amoxicillin Atorvastatin peridex Clonazepam Co  q-10 Docusate flonase Loratadine Valtrex  Patients Current Diet:  PEG feeds. Trach #6 cuffed using PMV with SLP Pta pt was using TF at 125 cc/hr 4pm until 8 am daily via PEG. No po at home reported pta.  Precautions / Restrictions Precautions Precautions: Fall Precautions/Special Needs: Swallowing   Has the patient had 2 or more falls or a fall with injury in the past year?No  Prior Activity Level Household: wheelchair  level mod assist  Prior Functional Level Self Care: Did the patient need help bathing, dressing, using the toilet or eating?  Dependent  Indoor Mobility: Did the patient need assistance with walking from room to room (with or without device)? Dependent  Stairs: Did the patient need assistance with internal or external stairs (with or without device)? Dependent  Functional Cognition: Did the patient need help planning regular tasks such as shopping or remembering to take medications? Dependent  Home Assistive Devices / Equipment Conversion van with ramp, tilt in space wheelchair, standard wheelchair, collapsible wheelchair, BSC, tub bench with back, hand held shower, STEADY lift, ramp into home, stair lift, hospital bed  PRIOR FUNCTIONAL LEVEL: Prior Device Use: Indicate devices/aids used by the patient prior to current illness, exacerbation or injury? Pt was wheelchair level transfers since leaving [facility] rehab stay. Slept in hospital bed, transferred with max assist transfers with one caregiver to wheelchair, took stair lift downstairs, used STEDY to transfer to tilt in space downstairs. Has BSC in downstairs office used for transfers to [facility]. To shower, transferred to tub bench from wheelchair, max assist to bathe and then transfers out. Spoke one word communication due to expressive aphasia. Had trach at home until 11/12/2015 when decannulation by MD. Once admitted to [facility] 12/28/2015, re cannulated stoma due to respiratory issues.  Took  stool softners in am daily. If no BM in two days, took stool softner in the evening and typically that worked. Did not have indwelling catheter at home. Had briefs.      Prior Functional Level Current Functional Level  Bed Mobility Max assist.  02/01/16 pt demonstrated ability to complete multiple supine to long sit transfers, with pt assisting with sitting forward using trunk, however increased difficulty with EOB sitting, requiring mod to max assist of 2 to prevent pt from sliding forward at EOB.LLE contracture preventing pt from shifting weight anterior to sit unsupported on EOB.  Transfers Max assist. Bed to wheelchair transfers mod to max assist. Used standard w/c upstairs and used STEDY to transfer to tilt in space w/c once downstairs  total assist to transfer to chair with lift  Mobility - Walk/Wheelchair  (pt doing stand pivot until LLE weakness onset two months ago). Spouse reports walk short distances at OP apartment at [facility] last year. Not taking steps in several months  Not attempted. Per MD at [facility], motor 4/5 bilateral biceps, triceps BLE 3/5. Baseline LUE, LLE contracture. Tone is increased throughout L greater than right. Reflexes 2+throughout, plantar responses are flexor bilaterally  Upper Body Dressing Max assist  increased ability to follow commands with BUE. Limited by tone in BUE, however able to reach almost full extension of right elbow and with increased left elbow ROM to about 90 degrees of extension. Intermittent verbal and tactile cues for maintaining alertness/eye opening. Able to participate in BUE AAROM exercises and grooming tasks with increased assist for incorporating LUE 2/2 tone and assist at right elbow needed for reaching forehead during face washing. Using BUE resting hand splints and pt with JAS splints being alternated with resting hands.   Lower Body Dressing Max assist  total assist  Grooming Max assist  max assist to total  Eating/Drinking PEG  feed 4 pm until 8 am daily at 125 cc/hr  PEG feeds  Toilet Transfer Max assist. Used BSC downstairs in office to stand pivot transfer Not attempted  Bladder Continence  Wore briefs for incontinence but did BSC transfers Indwelling catheter  Bowel Management Stool softners every am and if no BM in two days would take stool softner in the evening .wore briefs.  incontinent. Rectal tube removed. Treated for cdiff  Stair Climbing Unable. Ramp entry into home, stair lift to upstairs, conversion van with ramp in the community  (not attempted/unable)  Communication few words at baseline. Expressive aphasia at baseline. Single word level prior to admission.  PMV with SLP. Patient responded to yes/ no questions via head nod  and shake.. Limited vocalization, but did achieve intermittent audible sounds and demonstrate audible coughing. Did not expectorate any secretions from her oral cavity. Pt did not demonstrate inhalation using a respiratory trainer, but did demonstrate exhalation with respiratory trainer in place. Not intelligible at single word level.   Memory impaired impaired  Cooking/Meal Prep no     Housework no   Money Management no   Driving  no     Special needs/care consideration BiPAP/CPAP  N/a CPM  N/a Continuous Drip IV  N/a Dialysis  N/a Life Vest  N/a Oxygen 40 % trach collar Special Bed  N/a Trach Size #6 cuffed trach Wound Vac (area)  N/a Skin sacral area pink, reportedly old healed sacral wound per spouse Bowel mgmt: incontinent. Had been treated for cdiff. Rectal tube since removed Bladder mgmt: indwelling catheter Diabetic mgmt  N/a  Previous Home Environment Living Arrangements: Spouse/significant other  Lives With: Spouse Available Help at Discharge: Family, Personal care attendant, Available 24 hours/day Type of Home: House Home Layout: Two level, Bed/bath upstairs Alternate Level Stairs-Rails:  (chair lift to second floor) Alternate Level Stairs-Number of  Steps: [place]: No  Discharge Living Setting Plans for Discharge Living Setting: Patient's home, Lives with (comment) (spouse) Type of Home at Discharge: House Discharge Home Layout: Two level, Bed/bath upstairs (chair lift to second floor) Alternate Level Stairs-Number of Steps: flight (chsir lift to second floor) Does the patient have any problems obtaining your medications?: No  Social/Family/Support Systems Patient Roles: Spouse, Parent (Former [place] and [place]; Chief Executive Officer) Sport and exercise psychologist Information: [name] Regulatory affairs officer, spouse Anticipated Caregiver: spouse, family and hired caregivers; Conservation officer, historic buildings Information: [phone] cell Ability/Limitations of Caregiver: spouse and [name] caregiver have injured their backs with transfers Caregiver Availability: 24/7 Discharge Plan Discussed with Primary Caregiver: Yes Is Caregiver In Agreement with Plan?: Yes Does Caregiver/Family have Issues with Lodging/Transportation while Pt is in Rehab?: No  Hired caregiver, [name], in the home.  Goals/Additional Needs Patient/Family Goal for Rehab: Max assist with PT, OT, and SLP whellchair level goals Expected length of stay: ELOS 2- 4 weeks Dietary Needs: PEG feeds Special Service Needs: XXX status for privacy per husband's request Pt/Family Agrees to Admission and willing to participate: Yes Program Orientation Provided & Reviewed with Pt/Caregiver Including Roles  & Responsibilities: Yes  Patient Condition: I have reviewed medical records from [place] as well as interviewed Tourist information centre manager and spouse. Pt requires ongoing PT, OT, SLP, Rehabilitative Medicaine and Rehabilitative nursing care. She will benefit from the coordinated approach of the Rehab team. She is currently total assist . We will admit pt to acute inpatient rehab today.   Preadmission Screen Completed By:  Cleatrice Burke, 02/03/2016 1:19  PM ______________________________________________________________________   Discussed status with Dr. [name]  on 02/03/2016  at  1319  and received telephone approval for admission today.  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 Does the need for close, 24 hr/day  Medical supervision in concert with the patient's rehab needs make it unreasonable for this patient to be served in a less intensive setting? Yes 2. Co-Morbidities requiring supervision/potential complications: dysphagia/myoclonus 3. Due to bladder management, bowel management, safety, skin/wound care, disease management, medication administration, pain management and patient education, does the patient require 24 hr/day rehab nursing? Yes 4. Does the patient require coordinated care of a physician, rehab nurse, PT (  1-2 hrs/day, 5 days/week), OT (1-2 hrs/day, 5 days/week) and SLP (1-2 hrs/day, 5 days/week) to address physical and functional deficits in the context of the above medical diagnosis(es)? Yes Addressing deficits in the following areas: balance, endurance, locomotion, strength, transferring, bowel/bladder control, bathing, dressing, feeding, grooming, toileting, cognition, speech, language, swallowing and psychosocial support 5. Can the patient actively participate in an intensive therapy program of at least 3 hrs of therapy 5 days a week? Yes 6. The potential for patient to make measurable gains while on inpatient rehab is excellent 7. Anticipated functional outcomes upon discharge from inpatients are: max assist PT, max assist OT, max assist SLP 8. Estimated rehab length of stay to reach the above functional goals is: 14-28 days 9. Does the patient have adequate social supports to accommodate these discharge functional goals? Yes 10. Anticipated D/C setting: Home 11. Anticipated post D/C treatments:  HH therapy and Outpatient therapy 12. Overall Rehab/Functional Prognosis: excellent    RECOMMENDATIONS: This patient's condition is appropriate for continued rehabilitative care in the following setting: CIR Patient has agreed to participate in recommended program. Yes Note that insurance prior authorization may be required for reimbursement for recommended care.  Comment: admitting to inpatient rehab today  Cleatrice Burke 02/03/2016    Revision History

## 2016-02-03 NOTE — H&P (Signed)
Physical Medicine and Rehabilitation Admission H&P       Chief Complaint  Patient presents with  . Debility due to respiratory failure.     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 Hospital course significant for vent dependence with need for recanalization of trach and decline in MS due to refractory seizures despite multiple AEDs. She was transferred to [facility] on 12/10 for management and monitoring. 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   LTM without classic seizure activity and no clear seizures noted. 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   She continued to have fevers despite completion of PNA treatment and was found to be C diff positive on 12/27 and have candida UTI. She has been treated with diflucan as well as po Vanc/Flagyl. She was weaned to ATC on 01/1 and tolerating PMSV with tachypnea and RR up to 33 but no distress reported. Foley and rectal tube discontinued yesterday. Therapy ongoing and patient noted to be debilitated. CIR recommended for follow up therapy and records reviewed by MD/Rehab coordinator. She was felt to be a food CIR candidate.    Review of Systems  Unable to perform ROS: Patient nonverbal          Past Medical History:  Diagnosis Date  . Arthritis    lt great toe  . Complication of anesthesia    pt has had headaches post op-did advise to hydra well preop  . Hair loss    right-sided  . History of exercise stress  test    a. ETT (12/15) with 12:00 exercise, no ST changes, occasional PACs.   . Hyperlipidemia   . Insomnia   . Migraine headache   . 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 Postmenopausal   . Premature atrial contractions    a. Holter (12/15) with 8% PACs, no atrial runs or atrial fibrillation.          Past Surgical History:  Procedure Laterality Date  . BREAST BIOPSY  01/01/2011   Procedure: BREAST BIOPSY WITH NEEDLE LOCALIZATION;  Surgeon: Rolm Bookbinder, MD;  Location: [facility];  Service: General;  Laterality: Left;  left breast wire localization biopsy  . cataracts right eye    . CESAREAN SECTION  1986  . HERNIA REPAIR  2000   RIH  . opirectinal membrane peel    . RHINOPLASTY  1983          Family History  Problem Relation Age of Onset  . Stroke Mother     paralyzed on right side/aphasia  . Hypertension Mother     had stroke after stopping BP meds  . Colon cancer Father   . Prostate cancer Father   . Heart attack Maternal Grandfather 56  . Stroke Maternal Uncle 42    Social History:  Married.  Was attending outpatient PT. Per reports that she has never smoked. She has never used smokeless tobacco. Per  reports that she does not drink alcohol anymore. She reports that she does not use drugs.  Allergies  Allergen Reactions  . Pollen Extract Other (See Comments)    Running nose  . Tetracyclines & Related     Blisters, photosensitivity    (Not in a hospital admission)  Home: Home Living Living Arrangements: Spouse/significant other Available Help at Discharge: Family, Personal care attendant, Available 24 hours/day Type of Home: House Home Layout: Two level, Bed/bath upstairs Alternate Level Stairs-Number of Steps: flight Alternate Level Stairs-Rails:  (chair lift to second floor)  Lives With: Spouse   Functional History: Prior Function Level of Independence: Needs  assistance (Pt max assist transfers to wheelchair with [facility] equipment a) Gait / Transfers Assistance Needed: max assist +1 for bed mobility. max assist sit to stand with stedy. Patient was doing stand pivot until LLE weakness onset 2 months ago. Can do standing pivot max assist to wheelchair.  ADL's / Homemaking Assistance Needed: max assist with bathing, dressing.  Communication / Swallowing Assistance Needed: patient responds to yes/no questions at home, and can mouth short phrases.   Functional Status:  Mobility: Mod to max assit + 2 for bed mobility and to get to EOB.    ADL: Able to perform wash face and hand with cues and min assist right elbow.  Mod assist for oral hygeine.    Cognition: Able to answer Y/N question.  Able to follow simple one step commands.  Has intermittent audible vocalization.   [name]  Height 5\' 6"  (1.676 m), weight 52.2 kg (115 lb), last menstrual period 12/01/2010. Physical Exam  Nursing note and vitals reviewed. Constitutional: She appears well-developed. She has a sickly appearance.  Thin female with cuffed trach in place. Intermittent congested cough with difficulty mobilizing secretions. Was suctioned by RN with blood tinged secretions.   HENT:  Head: Normocephalic and atraumatic.  Dry oral mucosa --tongue and posterior pharynx with dry flaky material.   Eyes: Conjunctivae are normal. Pupils are equal, round, and reactive to light.  Neck:  #6 cuffed trach. Pt with secretions, fairly thick initially during exam which required suction  Cardiovascular:  Occasional extrasystoles are present. Tachycardia present.   Respiratory: Effort normal. No stridor. No respiratory distress. She has decreased breath sounds in the left lower field. She has no wheezes. She has rhonchi. She exhibits no tenderness.  Rhonchi scattered, more in left fields  GI: Soft. Bowel sounds are normal. She exhibits no distension. There is no tenderness.  PEG site clean and dry.   Musculoskeletal: She exhibits no edema.  Neurological: She is alert.  Made eye contact. Non verbal with left facial weakness. Did attempt to mouth simple words. Did not make consistent attempt at Y/N response. Significant flexor tone LUE at biceps and brachioradialis 2-3/4. LLE : hamstring hip adductors 3/4. Motor 2-3/5 RUE and RLE with inconsistent effort. LUE and LLE potentially 1-2/5 but blocked by tone. .    Skin: Skin is warm and dry. No rash noted. No erythema.  Psychiatric: She is slowed. She is noncommunicative.  Flat, fatigued initially but was more alert as engaged    Lab Results Last 48 Hours  No results found for this or any previous visit (from the past 48 hour(s)).   Imaging Results (Last 48 hours)  No results found.       Medical Problem List and Plan: 1.  Functional, mobility and cognitive/communication deficits secondary to debility in setting of prior encephalopathy/tetraplegia 2.  DVT Prophylaxis/Anticoagulation: Pharmaceutical: Lovenox 3. Pain Management: tylenol prn 4. Mood: LCSW to follow for evaluation when appropriate 5. Neuropsych: This patient  is not capable of making decisions on her own behalf. 6. Skin/Wound Care: routine pressure relief measures, Prevalon boots 7. Fluids/Electrolytes/Nutrition: Continue Tube feeds 125 cc from 4 pm to 8 am.  Water flushes 300 cc/ 5 X day. Will consult  Dietician for input. 8. Spasticity: On baclofen qid and valium qid.              -consider re-trial of klonopin             -splinting, dynamic left elbow, knee.              -ROM with therapy             -botox LUE/LLE while here (last botox 11/05/15) 9. Candida UTI/C diff colitis: has completed treatment.  10 VDRF: Continue cuffed #6 trach. Will add nebulizers prn to help mobilize secretions. Add mucinex to loosen secretions. PMSV with supervision. Pulmonary consulted for input/assistance.     Post Admission Physician Evaluation: 1. Functional deficits  secondary  to debilty/encephalopathy. 2. Patient is admitted to receive collaborative, interdisciplinary care between the physiatrist, rehab nursing staff, and therapy team. 3. Patient's level of medical complexity and substantial therapy needs in context of that medical necessity cannot be provided at a lesser intensity of care such as a SNF. 4. Patient has experienced substantial functional loss from his/her baseline which was documented above under the "Functional History" and "Functional Status" headings.  Judging by the patient's diagnosis, physical exam, and functional history, the patient has potential for functional progress which will result in measurable gains while on inpatient rehab.  These gains will be of substantial and practical use upon discharge  in facilitating mobility and self-care at the household level. 5. Physiatrist will provide 24 hour management of medical needs as well as oversight of the therapy plan/treatment and provide guidance as appropriate regarding the interaction of the two. 6. The Preadmission Screening has been reviewed and patient status is unchanged unless otherwise stated above. 7. 24 hour rehab nursing will assist with bladder management, bowel management, safety, skin/wound care, disease management, medication administration, pain management and patient education  and help integrate therapy concepts, techniques,education, etc. 8. PT will assess and treat for/with: Lower extremity strength, range of motion, stamina, balance, functional mobility, safety, adaptive techniques and equipment, NMR, spasticity mgt, splinting, family/care giver ed.   Goals are: max assist. 9. OT will assess and treat for/with: ADL's, functional mobility, safety, upper extremity strength, adaptive techniques and equipment, NMR, spastiicty mgt, splinting, caregiver ed.   Goals are: mod to max assist. Therapy may not yet proceed with showering this patient. 10. SLP will assess and treat  for/with: language, communication, trach mgt, speech, and cognition.  Goals are: mod to max assist. 11. Case Management and Social Worker will assess and treat for psychological issues and discharge planning. 12. Team conference will be held weekly to assess progress toward goals and to determine barriers to discharge. 13. Patient will receive at least 3 hours of therapy per day at least 5 days per week. 14. ELOS: 18-25 days       15. Prognosis:  good     Meredith Staggers, MD, [facility] 02/03/2016  Bary Leriche, Hershal Coria 02/03/2016

## 2016-02-04 ENCOUNTER — Inpatient Hospital Stay (HOSPITAL_COMMUNITY): Payer: BC Managed Care – PPO | Admitting: Speech Pathology

## 2016-02-04 ENCOUNTER — Inpatient Hospital Stay (HOSPITAL_COMMUNITY): Payer: BC Managed Care – PPO | Admitting: Occupational Therapy

## 2016-02-04 ENCOUNTER — Inpatient Hospital Stay (HOSPITAL_COMMUNITY): Payer: BC Managed Care – PPO | Admitting: Physical Therapy

## 2016-02-04 ENCOUNTER — Inpatient Hospital Stay (HOSPITAL_COMMUNITY): Payer: BC Managed Care – PPO

## 2016-02-04 DIAGNOSIS — G253 Myoclonus: Secondary | ICD-10-CM

## 2016-02-04 DIAGNOSIS — J9811 Atelectasis: Secondary | ICD-10-CM

## 2016-02-04 LAB — MRSA PCR SCREENING: MRSA BY PCR: NEGATIVE

## 2016-02-04 MED ORDER — DIAZEPAM 5 MG PO TABS
5.0000 mg | ORAL_TABLET | Freq: Every day | ORAL | Status: DC
  Administered 2016-02-04 – 2016-02-18 (×15): 5 mg
  Filled 2016-02-04 (×15): qty 1

## 2016-02-04 MED ORDER — AMANTADINE HCL 50 MG/5ML PO SYRP
150.0000 mg | ORAL_SOLUTION | Freq: Two times a day (BID) | ORAL | Status: DC
  Administered 2016-02-04 – 2016-02-10 (×12): 150 mg
  Filled 2016-02-04 (×12): qty 15

## 2016-02-04 MED ORDER — SACCHAROMYCES BOULARDII 250 MG PO CAPS
250.0000 mg | ORAL_CAPSULE | Freq: Two times a day (BID) | ORAL | Status: DC
  Administered 2016-02-04 – 2016-02-24 (×41): 250 mg via ORAL
  Filled 2016-02-04 (×41): qty 1

## 2016-02-04 MED ORDER — ONABOTULINUMTOXINA 100 UNITS IJ SOLR: 400.0000 [IU] | Freq: Once | INTRAMUSCULAR | Status: DC

## 2016-02-04 NOTE — Progress Notes (Signed)
Patient information reviewed and entered into eRehab system by Nature Kueker, RN, CRRN, PPS Coordinator.  Information including medical coding and functional independence measure will be reviewed and updated through discharge.    

## 2016-02-04 NOTE — Evaluation (Signed)
Physical Therapy Assessment and Plan  Patient Details  Name: Judy Spears MRN: [id number] Date of Birth: 1950-05-16  PT Diagnosis: Abnormal posture, Ataxia, Hypertonia, Impaired cognition, Impaired sensation, Muscle spasms and Muscle weakness Rehab Potential: Fair ELOS: 21-24 days   Today's Date: 02/04/2016 PT Individual Time: 1415-1600 PT Individual Time Calculation (min): 105 min     Problem List:  Patient Active Problem List   Diagnosis Date Noted  . Atelectasis 02/04/2016  . Debility 02/03/2016  . Pneumonia of both lower lobes due to Pseudomonas species ([place])   . Pneumonia of both lower lobes due to methicillin susceptible Staphylococcus aureus (MSSA) ([place])   . Acute respiratory failure with hypoxia ([place])   . Acute encephalopathy   . Seizures ([place])   . Sepsis ([place])   . Hypoxia   . Pain   . HCAP (healthcare-associated pneumonia) 12/28/2015  . Chronic respiratory failure ([place]) 12/28/2015  . Acute tracheobronchitis 12/24/2015  . Tracheostomy tube present ([place])   . Tracheal stenosis   . Chronic respiratory failure with hypoxia ([place])   . 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 Tracheostomy status ([place]) 09/26/2015  . Allergic rhinitis 09/26/2015  . 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 Movement disorder 09/22/2015  . Encephalitis 09/22/2015  . Chest pain 02/07/2014  . Premature atrial contractions 01/13/2014  . Abnormality of gait 12/04/2013  . Hyperlipidemia 12/21/2011  . Cardiovascular risk factor 12/21/2011  . Bunion 01/31/2008  . Metatarsalgia of both feet 09/20/2007  . FLAT FOOT 09/20/2007  . HALLUX RIGIDUS, ACQUIRED 09/20/2007    Past Medical History:  Past Medical History:  Diagnosis Date  . Arthritis    lt great toe  . Complication of anesthesia    pt has had headaches post op-did advise to hydra well preop  . Hair loss    right-sided  . History of exercise stress test    a. ETT (12/15) with 12:00 exercise, no ST changes, occasional PACs.   .  Hyperlipidemia   . Insomnia   . Migraine headache   . 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 Postmenopausal   . Premature atrial contractions    a. Holter (12/15) with 8% PACs, no atrial runs or atrial fibrillation.    Past Surgical History:  Past Surgical History:  Procedure Laterality Date  . BREAST BIOPSY  01/01/2011   Procedure: BREAST BIOPSY WITH NEEDLE LOCALIZATION;  Surgeon: Rolm Bookbinder, MD;  Location: [facility];  Service: General;  Laterality: Left;  left breast wire localization biopsy  . cataracts right eye    . CESAREAN SECTION  1986  . HERNIA REPAIR  2000   RIH  . opirectinal membrane peel    . 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 She has undergone extensive rehabilitation efforts. She presented to [facility] ED 12/28/2015 with increased respiratory distress and CXR suggestive of multilobar PNA. She was intubated by PCCM in the ED and a new #6 Shiley trach was placed percutaneously via her incompletely healed stoma using bronchoscopic guidance. She was admitted to ICU on full vent support.   Patient was treated for multilobar Health Care associated Pneumonia. Attempts were made to wean but failed due to persisting tachypnea while on pressure support trials. Sputum cultures revealed Methicillin susceptible staph aureus and pan sensitivepseudomonas. Zosyn and Vanc were  used from 12/3 until 12/6 with transition to cefepime on 12/7 when final culture data was available.  She was transferred to SDU with frequent attempts at weaning continued. On !2/7 it was felt that her mental status had declined and therefore EEG was obtained with capture seizure activity. Neurology was consulted. Numerous antiepileptic drugs were administered and the patient was  monitored on continuous EEG but her frequent seizures persisted. Cefepime was discontinued 12/9 having completed 7 full days of therapy coverage due to concern that it could be lowering her seizure threshold. CT scan of head without acute finding. After considering continuous drips for seizure suppression, Neurology suggested that the pt's refractory seizure activity would be best evaluated at a tertiary care center. The neurology team contacted the epilepsy team at [facility] who agreedto accept the patient in transfer.   Patient [facility] . She was started on LTM which showed SIRPIDS but no frank seizures. Her Vimpat was discontinued and EEG carefully monitored. No seizures were revealed and her remaining AEDS were slowly weaned off. Patient also dx with coronavirus as well as recurrent pseudomonas PNA. After several courses of antibiotics for her recurrent s/s PNA, her clinical symptoms resolved and pt was weaned from the vent. She continued to show pseudomonas colonization. She had persistent intermittent fevers and leukocytosis and was found to have c diff for which she was treated with PO Vanc/Iv flagyl, Also she has large amount of urine sediment with budding yeast on UA; urine cultures were otherwise negative and she was started on diflucan. During her stay she was found to be mildly hypertensive and was stared on norvasc. Patient transferred to CIR on 02/03/2016 .   Patient currently requires total with mobility secondary to muscle weakness and muscle joint tightness, decreased cardiorespiratoy endurance and decreased oxygen support, impaired timing and sequencing, abnormal tone, unbalanced muscle activation, ataxia and decreased coordination, decreased visual acuity and decreased visual motor skills, decreased motor planning and decreased sitting balance, decreased standing balance, decreased postural control and decreased balance  strategies.  Prior to hospitalization, patient was max with mobility and lived with Spouse in a House home.  Home access is  Ramped entrance.  Patient will benefit from skilled PT intervention to maximize safe functional mobility, minimize fall risk and decrease caregiver burden for planned discharge home with 24 hour assist.  Anticipate patient will benefit from follow up [facility] at discharge.  PT - End of Session Activity Tolerance: Tolerates 30+ min activity with multiple rests Endurance Deficit: Yes Endurance Deficit Description: Changes in vitals with mobility and bed level ADLs, on supplemental O2 PT Assessment Rehab Potential (ACUTE/IP ONLY): Fair PT Patient demonstrates impairments in the following area(s): Balance;Sensory;Skin Integrity;Endurance;Motor;Pain PT Transfers Functional Problem(s): Bed Mobility;Bed to Chair PT Plan PT Intensity: Minimum of 1-2 x/day ,45 to 90 minutes PT Frequency: 5 out of 7 days PT Duration Estimated Length of Stay: 21-24 days PT Treatment/Interventions: Therapeutic Activities;Therapeutic Exercise;UE/LE Coordination activities;UE/LE Strength taining/ROM;Patient/family education;Functional mobility training;Disease management/prevention;Community reintegration;Ambulation/gait training;Discharge planning;Balance/vestibular training;DME/adaptive equipment instruction;Neuromuscular re-education;Wheelchair propulsion/positioning PT Transfers Anticipated Outcome(s): maxA  PT Locomotion Anticipated Outcome(s): dependent w/c propulsion PT Recommendation Follow Up Recommendations: Home health PT Patient destination: Home Equipment Recommended: None recommended by PT  Skilled Therapeutic Intervention Pt received supine in bed with caregiver [name] present; no evidence of pain and agreeable to treatment. PT initial evaluation performed and completed with max/totalA +2 overall as described below. Pt able to demonstrate sitting balance on EOB and edge of  mat with min guard  to close S when supported with BUEs. Note ROM restrictions in extremities as described, hypertonicity throughout trunk and extremities which fluctuates with movement and observed associated reactions. Pt transferred to mat with totalA +2 squat pivot. MaxA supine <>sit. Performed sit <>stand x3 trials with BLE AFOs donned, +3A for stabilizing BLEs, hip/trunk extension. Pt with stable vitals throughout on 10L O2 via trach collar. Remained seated in w/c at end of session with caregiver present and all needs in reach.   PT Evaluation Precautions/Restrictions Precautions Precautions: Fall Precaution Comments: Trach, PEG, supplemental O2, PMSV Required Braces or Orthoses: Other Brace/Splint Other Brace/Splint: Pt has B AFOs and UE splints Restrictions Weight Bearing Restrictions: No General Chart Reviewed: Yes Response to Previous Treatment: Patient unable to report, no changes reported from family or staff Family/Caregiver Present: Yes   Vital SignsTherapy Vitals Temp: 97.8 F (36.6 C) Temp Source: Oral Pulse Rate: 77 Resp: (!) 22 BP: (!) 133/94 Patient Position (if appropriate): Lying Oxygen Therapy SpO2: 100 % O2 Device: Not Delivered Pain Pain Assessment Pain Assessment: Faces Faces Pain Scale: No hurt Home Living/Prior Functioning Home Living Available Help at Discharge: Family;Personal care attendant;Available 24 hours/day Type of Home: House Home Access: Ramped entrance Home Layout: Two level;Bed/bath upstairs Alternate Level Stairs-Number of Steps: flight Alternate Level Stairs-Rails:  (chair lift to second floor)  Lives With: Spouse Prior Function Level of Independence: Needs assistance with tranfers;Other (comment) (full time caregiver, max to [place] for all mobility and ADLs)  Able to Take Stairs?: No Driving: No Vision/Perception  Vision - Assessment Additional Comments: Nestagmus with rolling; no smooth eye movements, unable to disassociate eye/ head movements   Cognition Overall Cognitive Status: History of cognitive impairments - at baseline Arousal/Alertness: Awake/alert Orientation Level: Oriented to person Attention: Focused;Sustained Focused Attention: Appears intact Sustained Attention: Impaired Sustained Attention Impairment: Verbal basic;Functional basic Memory: Impaired Awareness: Impaired Problem Solving: Impaired Problem Solving Impairment: Verbal basic;Functional basic Safety/Judgment: Other (comment) Comments: Pt with hx of enpehalopathy; non-verbal; answers yes/no questions with head nod ~25% of time Sensation Sensation Light Touch: Appears Intact Proprioception: Impaired Detail Proprioception Impaired Details: Impaired RLE;Impaired LLE Coordination Gross Motor Movements are Fluid and Coordinated: No Fine Motor Movements are Fluid and Coordinated: No Coordination and Movement Description: Associative reactions in UEs/LEs Finger Nose Finger Test: Unable to assess due to cognition Motor  Motor Motor: Abnormal postural alignment and control;Abnormal tone;Primitive reflexes present Motor - Skilled Clinical Observations: Associated reactions in UEs/LEs  Mobility Bed Mobility Bed Mobility: Rolling Right;Rolling Left Rolling Right: 1: +1 Total assist Rolling Right Details: Tactile cues for placement;Tactile cues for weight shifting;Verbal cues for sequencing;Verbal cues for technique;Tactile cues for weight bearing Rolling Left: 1: +1 Total assist Rolling Left Details: Verbal cues for technique;Verbal cues for sequencing;Tactile cues for weight beaing;Tactile cues for weight shifting;Tactile cues for placement Supine to Sit: 1: +2 Total assist;HOB flat Supine to Sit: Patient Percentage: 10% Supine to Sit Details: Tactile cues for placement;Tactile cues for weight beaing;Tactile cues for weight shifting;Verbal cues for technique;Verbal cues for sequencing;Manual facilitation for placement;Manual facilitation for weight  shifting;Manual facilitation for weight bearing Sit to Supine: 1: +2 Total assist Sit to Supine - Details: Manual facilitation for weight bearing;Manual facilitation for weight shifting;Manual facilitation for placement;Verbal cues for sequencing;Verbal cues for technique;Tactile cues for weight shifting;Tactile cues for posture Transfers Transfers: Yes Squat Pivot Transfers: 1: +2 Total assist Squat Pivot Transfer Details: Verbal cues for sequencing;Tactile cues for placement;Tactile cues for weight shifting;Manual facilitation for placement;Manual facilitation  for weight shifting;Verbal cues for technique Locomotion  Ambulation Ambulation: No Gait Gait: No Stairs / Additional Locomotion Stairs: No Wheelchair Mobility Wheelchair Mobility: No (dependent)  Trunk/Postural Assessment  Cervical Assessment Cervical Assessment: Exceptions to [facility] (limited neck ROM throughout) Thoracic Assessment Thoracic Assessment: Exceptions to [facility] (rounded shoulders, L lateral scoliosis, flexible within small range) Lumbar Assessment Lumbar Assessment: Exceptions to [facility] (posterior pelvic tilt, L pelvic upslip) Postural Control Trunk Control: Poterior lean; fair-poor trunk control Righting Reactions: Delayed/absent righting reactions  Balance Balance Balance Assessed: Yes Static Sitting Balance Static Sitting - Balance Support: Feet supported;Bilateral upper extremity supported Static Sitting - Level of Assistance: 2: Max assist;3: Mod assist Static Sitting - Comment/# of Minutes: Sitting EOB initially max A able to sit with stand by assist EOM Dynamic Sitting Balance Dynamic Sitting - Balance Support: Bilateral upper extremity supported;Feet supported Dynamic Sitting - Level of Assistance: 3: Mod assist;2: Max assist Sitting balance - Comments: Sitting EOB to dress UB and lateral weightshifts EOM Static Standing Balance Static Standing - Balance Support: Bilateral upper extremity supported Static  Standing - Level of Assistance: 1: +2 Total assist Static Standing - Comment/# of Minutes: +3 from EOM Dynamic Standing Balance Dynamic Standing - Balance Support: Bilateral upper extremity supported Dynamic Standing - Level of Assistance: 1: +2 Total assist Dynamic Standing - Comments: +3 Extremity Assessment  RUE Assessment RUE Assessment: Exceptions to [facility] RUE PROM (degrees) Overall PROM Right Upper Extremity: Deficits RUE Tone RUE Tone: Modified Ashworth Modified Ashworth Scale for Grading Hypertonia RUE: Slight increase in muscle tone, manifested by a catch and release or by minimal resistance at the end of the range of motion when the affected part(s) is moved in flexion or extension LUE Assessment LUE Assessment: Exceptions to [facility] LUE Strength LUE Overall Strength: Deficits;Due to premorbid status LUE Overall Strength Comments: Roughly 2/5 throughout Left Hand Gross Grasp: Impaired (Limited digit flexion/extension limiting functional grasping abilities) LUE Tone LUE Tone: Modified Ashworth;Severe;Hypertonic Modified Ashworth Scale for Grading Hypertonia LUE: Affected part(s) rigid in flexion or extension RLE Assessment RLE Assessment: Exceptions to [facility] RLE PROM (degrees) Overall PROM Right Lower Extremity: Deficits RLE Overall PROM Comments: difficult to assess d/t tone, pain Right Hip Flexion: 90 Right Knee Extension: -10 Right Ankle Dorsiflexion: 5 RLE Strength RLE Overall Strength: Deficits RLE Overall Strength Comments: strength available likely due to/assisted by hypertonicity RLE Tone RLE Tone: Hypertonic;Moderate Hypertonic Details: fluctuating tone with associated reactions when opposite extremity performing AROM/PROM LLE Assessment LLE Assessment: Exceptions to WFL LLE PROM (degrees) Overall PROM Left Lower Extremity: Deficits;Other (Comment) (due to tone) LLE Overall PROM Comments: deficits due to hypertonicity Left Hip Extension: -25 degrees Left Hip  Flexion: 100 degrees Left Knee Extension: 145 degrees Left Knee Flexion: WFL Left Ankle Dorsiflexion: 0 LLE Strength LLE Overall Strength: Deficits LLE Overall Strength Comments: increased strength likely due to/assisted by hypertonicity LLE Tone LLE Tone: Moderate;Hypertonic Hypertonic Details: fluctuating tone, associated reactions when opposite extremity/trunk AROM/PROM   See Function Navigator for Current Functional Status.   Refer to Care Plan for Long Term Goals  Recommendations for other services: None   Discharge Criteria: Patient will be discharged from PT if patient refuses treatment 3 consecutive times without medical reason, if treatment goals not met, if there is a change in medical status, if patient makes no progress towards goals or if patient is discharged from hospital.  The above assessment, treatment plan, treatment alternatives and goals were discussed and mutually agreed upon: by patient and by family  Lanora Manis  J Tygielski 02/04/2016, 4:15 PM

## 2016-02-04 NOTE — Progress Notes (Signed)
Initial Nutrition Assessment  DOCUMENTATION CODES:   Not applicable  INTERVENTION:  Continue home TF regimen:  Nocturnal tube feeds of Osmolite 1.5 formula via PEG at goal rate of 125 ml/hr x 16 hours (4pm-8am) to provide 3000 kcal, 125 grams of protein, and 1520 ml of free water.   Continue free water flushes of 300 ml given 5 times daily per tube.  Discontinue Prostat.  RD to continue to monitor.   NUTRITION DIAGNOSIS:   Increased nutrient needs related to chronic illness as evidenced by estimated needs.  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 Hospital course significant for vent dependence with need for recanalization of trach and decline in MS due to refractory seizures despite multiple AEDs. She was transferred to [facility] on 12/10 for management and monitoring. 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 She was weaned to [facility] on 01/1 and tolerating PMSV.  Pt was busy working with respiratory therapist during time of visit. Husband and caregiver, [name], at bedside. They report pt has been tolerating her tube feeds fine with no difficulties. Husband reports home tube feeding regimen is specifically a nocturnal tube feed of Osmolite 1.5 formula at goal rate of 125 ml/hr x 16 hours (4pm-8am) with free water flushes of 300 ml 5 times daily. Husband  and caregiver would like the patient to continue with this home tube feeding regimen. Husband reports pt has been on this tube feeding regimen for 1 year and has had no difficulties with it. Weight has been stable with usual body weight ~110-120 lbs. RN additionally reports pt has been tolerating her tube feeds with no difficulties. RD to continue with current home tube feeding regimen while in rehab. Noted 30 ml Prostat BID has been ordered. RD to discontinue as husband reports no additional protein supplements are usually given at home. Unable to complete Nutrition-Focused physical exam at this time. RD to perform physical exam at next visit.   Will continue to monitor. Labs and medications reviewed.    Diet Order:  Diet NPO time specified  Skin:  Reviewed, no issues  Last BM:  1/9  Height:   Ht Readings from Last 1 Encounters:  02/03/16 5\' 4"  (1.626 m)    Weight:   Wt Readings from Last 1 Encounters:  02/04/16 118 lb 9.7 oz (53.8 kg)    Ideal Body Weight:  54.5 kg  BMI:  Body mass index is 20.36 kg/m.  Estimated Nutritional Needs:   Kcal:  1900-2100 minimum  Protein:  95-115 grams minimum  Fluid:  1.9 - 2.1 L/day minimum  EDUCATION NEEDS:   No education needs identified at this time  Corrin Parker, MS, RD, LDN Pager # [phone] After hours/ weekend pager # [phone]

## 2016-02-04 NOTE — Progress Notes (Signed)
Recreational Therapy Session Note  Patient Details  Name: Judy Spears MRN: [id number] Date of Birth: [birth year] Today's Date: 02/04/2016  TR eval deferred as pt not yet appropriate for TR services per team report.  Will continue to monitor through team for future participation. [place] 02/04/2016, 4:05 PM

## 2016-02-04 NOTE — Consult Note (Signed)
Name: Judy Spears MRN: [id number] DOB: 03/08/1950    ADMISSION DATE:  02/03/2016 CONSULTATION DATE:  02/03/16  REFERRING MD :  [name]  CHIEF COMPLAINT:  VDRF  Brief  Judy Spears is a 66 y.o. 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  She initially had rehabilitation at [facility] in [place] and then returned to [place].  She had trach removed in May 2017 but unfortunately had complications of laryngospasm and had to have trach re-done 2 weeks later followed by tracheal stenosis partial resection and #4 cuffed tracheostomy placed by ENT in [facility] in June 2017.  Not long after that she was transferred back to her home city of [place] where she participated in ongoing rehabilitation. Pulmonary medicine was consulted for further evaluation. By September 2017 she had been maintained with her tracheostomy and a cuff-less state, capped 24 hours a day for several months. After an initial period of observation in [place] we electively decannulate her in October 2017. She did well for 6 weeks from a respiratory standpoint until she came down with a viral illness in late November 2017. Initially she tolerated this well but unfortunately not long afterwards she developed pneumonia with Pseudomonas and staph requiring recannulation with a tracheostomy and mechanical ventilation. She was treated with cefepime and electroencephalographic studies at our center were worrisome for status epilepticus. She was transferred to [facility] for further management. There was felt that the electrical activity was not consistent with epilepsy and antiepileptic medicines were eventually held. There she was treated for Klebsiella and Pseudomonas pneumonia with vancomycin, Zosyn, and meropenem. Most recent course of  antibiotics ended on 01/25/2016. She was also treated with C. difficile treatment (oral Flagyl and vancomycin) through 01/31/2016. She was also found to have pyelonephritis believed to be due to Candida and was treated with fluconazole through 02/01/2016.  She was treated with mechanical ventilation through 01/26/2016 and has been transitioned to a collar device since then. She has used a speaking valve some since then. She has had persistent mucus production and is able to cough it out around her #6 cuffed trach with the cuff deflated.  She still has thick brown secretions.  A CT scan performed on 01/25/2016 showed a left lower lobe pneumonia and likely pyelonephritis and evidence of diarrhea. There was also nonspecific pancreatic duct enlargement.  Since initial diagnosis, she has had complicated course; most recently over the past couple of months.     SUBJECTIVE:   1/10 - caregiver [name] at bedside - says mucus plug repoorted by EMT enroute from [facility] to [facility] rehab.  At admit observation of changed colored secretions. This AM caregiver reprots that post Chest PT secretions might be better. PT - APP Olin Hauser reports no fever. CXR though has RML atelectasis since admit 02/03/2016 and might be worse 02/04/2016 -> No report of such at [facility] 01/20/16 and 01/23/16 but might be on CT 01/25/16     VITAL SIGNS: Temp:  [97 F (36.1 C)-97.5 F (36.4 C)] 97.5 F (36.4 C) (01/10 0610) Pulse Rate:  [72-83] 79 (01/10 0921) Resp:  [17-18] 18 (01/10 0921) BP: (119-124)/(67-68) 124/67 (01/10 0610) SpO2:  [98 %-100 %] 99 % (01/10 0921) FiO2 (%):  [35 %-40 %] 40 % (01/10 0921) Weight:  [53.8 kg (118 lb 9.7 oz)-54.4 kg (120 lb)] 53.8 kg (118  lb 9.7 oz) (01/10 0610)  PHYSICAL EXAMINATION: General: Chronically ill appearing female, resting in bed, in NAD. Sitting in wheel chair Neuro: Awake, tracks appropriately.  Followed simple commands - smiled at MD HEENT: [place]/AT. PERRL, sclerae anicteric.  Trach C/D/I, #6  cuffed. Cardiovascular: RRR, no M/R/G.  Lungs: Respirations even and unlabored.  Coarse breath shounds  bilaterally. Abdomen: PEG in place.  BS x 4, soft, NT/ND.  Musculoskeletal: No gross deformities, no edema.  Skin: Intact, warm, no rashes.    No results for input(s): NA, K, CL, CO2, BUN, CREATININE, GLUCOSE in the last 168 hours. No results for input(s): HGB, HCT, WBC, PLT in the last 168 hours. Dg Chest Port 1 View  Result Date: 02/04/2016 CLINICAL DATA:  History of pneumonia, followup EXAM: PORTABLE CHEST 1 VIEW COMPARISON:  Portable chest x-ray 02/03/2016 FINDINGS: There has been an increase in opacity at the right lung base consistent with atelectasis or pneumonia. Mild atelectasis remains at the left lung base. No definite effusion is seen. The heart is within upper limits normal. Tracheostomy remains. IMPRESSION: Increase in opacity at the right lung base consistent with atelectasis or pneumonia. Electronically Signed   By: Ivar Drape M.D.   On: 02/04/2016 08:12   Dg Chest Port 1 View  Result Date: 02/03/2016 CLINICAL DATA:  Acute onset of shortness of breath. Initial encounter. EXAM: PORTABLE CHEST 1 VIEW COMPARISON:  Chest radiograph from 01/03/2016 FINDINGS: There is mildly worsened bibasilar airspace opacity, which could reflect recurrent or persistent pneumonia. Mild vascular congestion and peribronchial thickening are seen. No pleural effusion or pneumothorax is identified. The cardiomediastinal silhouette is normal in size. The patient's tracheostomy tube is seen ending 3-4 cm above the carina. No acute osseous abnormalities are seen. IMPRESSION: Mildly worsened bibasilar airspace opacity, which could reflect recurrent or persistent pneumonia, given clinical concern. Mild vascular congestion and peribronchial thickening seen. Electronically Signed   By: Garald Balding M.D.   On: 02/03/2016 19:19    STUDIES:  01/25/2016 CT chest/ab/pelvis [facility]: 1) likely peylo left kidney,  2) diarrhea in colon, 3) Ground glass in RML and RLL likely pneumonia vs aspiration, near complete left lower lobe consolidation with distal airways plugging, 4) compression fractures T1 and L2-L4, 5) 13 mm soft tissue nodule left upper breast 6) mildly dilated pancreatic duct indeterminate etiology with pancreatic atropy  SIGNIFICANT EVENTS    ASSESSMENT / PLAN:  66 year old female with a past medical history significant for Powassen virus encephalitis in November 2016 leading to significant neurologic injury and prolonged rehab course comes back to [facility] rehabilitation after a recent hospitalization for healthcare associated pneumonia leading to respiratory failure, C. difficile, and a neurologic evaluation for possible epilepsy. Fortunately there is no evidence of status epilepticus. Pulmonary and critical care medicine is consulted for assistance with management of pulmonary secretions and tracheostomy.  Healthcare associated pneumonia: Recently treated with IV antibiotics. CXRs 's likely going to be abnormal considering her multiple recent episodes of pneumonia.  1/10 - secretions  Might be better with pulm toilet though cxr is not and perhaps shows new RML Atlelectass/infiltrate. High risk for recurrent  C dif  PLAN - Hold off on antibiotics unless respiratory status changes or chest x-ray findings conitnue to  [place]  = get cxr again on Friday 02/06/16 - Agree with speech therapy evaluation, eventually will need to repeat swallowing evaluation - await respiratory culture -  continue guaifenesin scheduled - conntinue hypertonic saline daily to facilitate mucus clearance - albuterol prn -continue  chest pt bid  Tracheostomy status: - Continue tracheostomy care per routine - If her respiratory status remains stable we will change trach per protool   D/w Reesa Chew - aPP of rehab   Dr. Brand Males, M.D., [facility].C.P Pulmonary and Critical Care Medicine Staff Physician [place] [facility] Pager: [phone], If no answer or between  15:00h - 7:00h: call [phone]  02/04/2016 10:37 AM

## 2016-02-04 NOTE — Evaluation (Addendum)
Occupational Therapy Assessment and Plan  Patient Details  Name: Judy Spears MRN: [id number] Date of Birth: 03-14-1950  OT Diagnosis: abnormal posture, acute pain, ataxia, cognitive deficits, disturbance of vision, muscular wasting and disuse atrophy, muscle weakness (generalized), pain in joint and progressive muscular atrophy Rehab Potential: Rehab Potential (ACUTE ONLY): Fair ELOS: 21-24 days   Today's Date: 02/04/2016 OT Individual Time: 0964-3838 OT Individual Time Calculation (min): 60 min      Problem List: Patient Active Problem List   Diagnosis Date Noted  . Debility 02/03/2016  . Pneumonia of both lower lobes due to Pseudomonas species ([place])   . Pneumonia of both lower lobes due to methicillin susceptible Staphylococcus aureus (MSSA) ([place])   . Acute respiratory failure with hypoxia ([place])   . Acute encephalopathy   . Seizures ([place])   . Sepsis ([place])   . Hypoxia   . Pain   . HCAP (healthcare-associated pneumonia) 12/28/2015  . Respiratory failure ([place]) 12/28/2015  . Acute tracheobronchitis 12/24/2015  . Tracheostomy tube present ([place])   . Tracheal stenosis   . Chronic respiratory failure with hypoxia ([place])   . 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 Tracheostomy status ([place]) 09/26/2015  . Allergic rhinitis 09/26/2015  . 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 Movement disorder 09/22/2015  . Encephalitis 09/22/2015  . Chest pain 02/07/2014  . Premature atrial contractions 01/13/2014  . Abnormality of gait 12/04/2013  . Hyperlipidemia 12/21/2011  . Cardiovascular risk factor 12/21/2011  . Bunion 01/31/2008  . Metatarsalgia of both feet 09/20/2007  . FLAT FOOT 09/20/2007  . HALLUX RIGIDUS, ACQUIRED 09/20/2007    Past Medical History:  Past Medical History:  Diagnosis Date  . Arthritis    lt great toe  . Complication of anesthesia    pt has had headaches post op-did advise to hydra well preop  . Hair loss    right-sided  . History of exercise stress test    a.  ETT (12/15) with 12:00 exercise, no ST changes, occasional PACs.   . Hyperlipidemia   . Insomnia   . Migraine headache   . 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 Postmenopausal   . Premature atrial contractions    a. Holter (12/15) with 8% PACs, no atrial runs or atrial fibrillation.    Past Surgical History:  Past Surgical History:  Procedure Laterality Date  . BREAST BIOPSY  01/01/2011   Procedure: BREAST BIOPSY WITH NEEDLE LOCALIZATION;  Surgeon: Rolm Bookbinder, MD;  Location: [place];  Service: General;  Laterality: Left;  left breast wire localization biopsy  . cataracts right eye    . CESAREAN SECTION  1986  . HERNIA REPAIR  2000   RIH  . opirectinal membrane peel    . RHINOPLASTY  1983    Assessment & Plan Clinical Impression: [name] is a 66 year old female with history of encephalitis due to Powassen virus 18/4037 complicated by VDRF, R-VC paralysis, tracheal stenosis s/p repair, spasticity, restlessness, recent admission 12/28/2015 for acute respiratory failure due to Pseudomonas and MSSA multilobar HCAP. Hospital course significant for vent dependence with need for recanalization of trach and decline in MS due to refractory seizures despite multiple AEDs. She was transferred to [facility] on 12/10 for management and monitoring. 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   LTM without classic seizure activity and no  clear seizures noted. Neurology felt that patient with SIRPDs (Stimulus-induced Rhythmic, Periodic or Ictal discharges)and she was weaned off AEDs. She continued to have fevers despite completion of PNA treatment and was found to be C diff positive on 12/27 and have candida UTI. She has been treated with diflucan as well as po Vanc/Flagyl. She was weaned to ATC  on 01/1 and tolerating PMSV with tachypnea and RR up to 33 but no distress reported. Foley and rectal tube discontinued yesterday. Therapy ongoing and patient noted to be debilitated. CIR recommended for follow up therapy and records reviewed by MD/Rehab coordinator. She was felt to be a food CIR candidate.  Patient transferred to CIR on 02/03/2016 .    Patient currently requires total with basic self-care skills secondary to muscle weakness, muscle joint tightness and muscle paralysis, decreased cardiorespiratoy endurance and decreased oxygen support, abnormal tone, unbalanced muscle activation, ataxia, decreased coordination and decreased motor planning,  and decreased sitting balance, decreased postural control and decreased balance strategies.  Prior to hospitalization, patient could complete ADLs with max- total A.  Patient will benefit from skilled intervention to decrease level of assist with basic self-care skills prior to discharge home with care partner.  Anticipate patient will require max- total A and follow up home health.  OT - End of Session Activity Tolerance: Tolerates < 10 min activity with changes in vital signs Endurance Deficit: Yes Endurance Deficit Description: Changes in vitals with mobility and bed level ADLs, on supplemental O2 OT Assessment Rehab Potential (ACUTE ONLY): Fair Barriers to Discharge: Other (comment) Barriers to Discharge Comments: Medically involved OT Patient demonstrates impairments in the following area(s): Balance;Cognition;Endurance;Motor;Pain;Sensory;Safety;Perception;Vision;Skin Integrity OT Basic ADL's Functional Problem(s): Grooming;Bathing;Dressing;Toileting OT Transfers Functional Problem(s): Toilet OT Additional Impairment(s): Fuctional Use of Upper Extremity OT Plan OT Intensity: Minimum of 1-2 x/day, 45 to 90 minutes OT Frequency: 5 out of 7 days OT Duration/Estimated Length of Stay: 21-24 days OT Treatment/Interventions: Land;Discharge planning;Functional electrical stimulation;Functional mobility training;Neuromuscular re-education;Psychosocial support;Patient/family education;Pain management;Self Care/advanced ADL retraining;Splinting/orthotics;UE/LE Strength taining/ROM;Therapeutic Exercise;Therapeutic Activities;UE/LE Coordination activities;Wheelchair propulsion/positioning OT Self Feeding Anticipated Outcome(s): N/A, pt on tube feeds PTA OT Basic Self-Care Anticipated Outcome(s): Total A OT Toileting Anticipated Outcome(s): Total A OT Bathroom Transfers Anticipated Outcome(s): Total A OT Recommendation Patient destination: Home Follow Up Recommendations: Home health OT;24 hour supervision/assistance Equipment Recommended: None recommended by OT Equipment Details: Pt has all needed DME   Skilled Therapeutic Intervention Pt seen for OT eval and ADL bathing/dressing session. Pt in supine upon arrival with husband present. Pt appearing agreeable to tx session and nodded head "no" when asked about pain. Bathing and LB dressing completed total A in supine. Pt noted to have associated reactions in LEs during dressing task, She intiated pulling up of pants once threaded.  She transferred to EOB with total A +2. Pt sat EOB with mod- total A for sitting balance on EOB. Total A to don shirt. Due to LE tone/ positioning, pt unable to place L LE onto floor and required total A from therapist for positioning of R LE during +2 total- dependent squat pivot transfer to tilt-in-space w/c.  Pt declined x2 attempts of wearing PMSV. Pt on 6L supplemental O2 with O2 levels remaining WFL.  Pt left seated in w/c at end of session, personal care nurse and husband present.  Educated throughout session regarding role of OT, pt's PLOF and family/pt's  goals for therapy, and d/c planning.   OT Evaluation Precautions/Restrictions  Precautions Precautions: Fall Precaution Comments:  Trach, PEG,  supplemental O2, PMSV Required Braces or Orthoses: Other Brace/Splint Other Brace/Splint: Pt has B AFOs and UE splints Restrictions Weight Bearing Restrictions: No General Chart Reviewed: Yes Additional Pertinent History: Hx of encephalopathy 11/16 Family/Caregiver Present: Yes Home Living/Prior [place] expects to be discharged to:: Private residence Living Arrangements: Spouse/significant other Available Help at Discharge: Family, Personal care attendant, Available 24 hours/day Type of Home: House Home Access: Ramped entrance Home Layout: Two level, Bed/bath upstairs Alternate Level Stairs-Number of Steps: flight Alternate Level Stairs-Rails:  (chair lift to second floor)  Lives With: Spouse Prior Function Level of Independence: Needs assistance with tranfers, Other (comment) (full time caregiver, max to totalA for all mobility and ADLs)  Able to Take Stairs?: No Driving: No Vision/Perception  Vision- History Baseline Vision/History: Wears glasses Wears Glasses: At all times Patient Visual Report: Other (comment) (Has prism glasses worn at all times PTA) Vision- Assessment Vision Assessment?: Vision impaired- to be further tested in functional context Additional Comments: Nestagmus with rolling; no smooth eye movements, unable to disassociate eye/ head movements  Cognition Overall Cognitive Status: History of cognitive impairments - at baseline (Pt with hx of encephalitis; non-verbal) Arousal/Alertness: Awake/alert Year: Other (Comment) (Unable to assess; non-verbal) Attention: Focused Focused Attention: Impaired Awareness: Impaired Problem Solving: Impaired Problem Solving Impairment: Verbal basic;Functional basic Safety/Judgment: Other (comment) Comments: Pt with hx of enpehalopathy; non-verbal; answers yes/no questions with head nod ~25% of time Sensation Sensation Light Touch: Appears Intact Proprioception: Impaired  Detail Proprioception Impaired Details: Impaired RLE;Impaired LLE Coordination Gross Motor Movements are Fluid and Coordinated: No Fine Motor Movements are Fluid and Coordinated: No Coordination and Movement Description: Associative reactions in UEs/LEs Finger Nose Finger Test: Unable to assess due to cognition Motor  Motor Motor: Abnormal postural alignment and control;Abnormal tone;Primitive reflexes present Motor - Skilled Clinical Observations: Associated reactions in UEs/LEs Trunk/Postural Assessment  Cervical Assessment Cervical Assessment: Exceptions to [facility] (Limited scapular mobilization) Thoracic Assessment Thoracic Assessment: Exceptions to [facility] (Rounded shoulders with no disassociation of movements) Lumbar Assessment Lumbar Assessment: Exceptions to [facility] (Posterior pelvic tilt, unable to range to neutral) Postural Control Head Control: Lateral lean to L requiring head support in chair Trunk Control: Poterior lean; fair-poor trunk control Righting Reactions: Delayed righting reactions  Balance Balance Balance Assessed: Yes Static Sitting Balance Static Sitting - Balance Support: Feet supported;Bilateral upper extremity supported Static Sitting - Level of Assistance: 2: Max assist;3: Mod assist Static Sitting - Comment/# of Minutes: Sitting EOB initially max A able to sit with stand by assist EOM Dynamic Sitting Balance Dynamic Sitting - Balance Support: Bilateral upper extremity supported;Feet supported Dynamic Sitting - Level of Assistance: 3: Mod assist;2: Max assist Sitting balance - Comments: Sitting EOB to dress UB and lateral weightshifts EOM Static Standing Balance Static Standing - Balance Support: Bilateral upper extremity supported Static Standing - Level of Assistance: 1: +2 Total assist Static Standing - Comment/# of Minutes: +3 from EOM Dynamic Standing Balance Dynamic Standing - Balance Support: Bilateral upper extremity supported Dynamic Standing - Level  of Assistance: 1: +2 Total assist Dynamic Standing - Comments: +3 Extremity/Trunk Assessment RUE Assessment RUE Assessment: Exceptions to [facility] RUE PROM (degrees) Overall PROM Right Upper Extremity: Deficits RUE Tone RUE Tone: Modified Ashworth Modified Ashworth Scale for Grading Hypertonia RUE: Slight increase in muscle tone, manifested by a catch and release or by minimal resistance at the end of the range of motion when the affected part(s) is moved in flexion or extension     See  Function Navigator for Current Functional Status.   Refer to Care Plan for Long Term Goals  Recommendations for other services: None    Discharge Criteria: Patient will be discharged from OT if patient refuses treatment 3 consecutive times without medical reason, if treatment goals not met, if there is a change in medical status, if patient makes no progress towards goals or if patient is discharged from hospital.  The above assessment, treatment plan, treatment alternatives and goals were discussed and mutually agreed upon: by patient and by family  [name], Jalon Blackwelder C 02/04/2016, 8:45 AM

## 2016-02-04 NOTE — Progress Notes (Signed)
Suctioned patient total of 8 times by this RN today.  Moderate amount of white/yellow tinged, thick sputum returned.  Patient tolerates well and even requests suctioning at times.  Family at bedside.  Patient now hooked up to nocturnal tube feeding, resting in bed.  Brita Romp, RN

## 2016-02-04 NOTE — Progress Notes (Signed)
[facility] PHYSICAL MEDICINE & REHABILITATION     PROGRESS NOTE    Subjective/Complaints: Had a reasonable night. Slept well. No issues this morning per RN.   ROS: limited by cognition   Objective: Vital Signs: Blood pressure 124/67, pulse 72, temperature 97.5 F (36.4 C), temperature source Oral, resp. rate 18, height 5\' 4"  (1.626 m), weight 53.8 kg (118 lb 9.7 oz), last menstrual period 12/01/2010, SpO2 98 %. Dg Chest Port 1 View  Result Date: 02/04/2016 CLINICAL DATA:  History of pneumonia, followup EXAM: PORTABLE CHEST 1 VIEW COMPARISON:  Portable chest x-ray 02/03/2016 FINDINGS: There has been an increase in opacity at the right lung base consistent with atelectasis or pneumonia. Mild atelectasis remains at the left lung base. No definite effusion is seen. The heart is within upper limits normal. Tracheostomy remains. IMPRESSION: Increase in opacity at the right lung base consistent with atelectasis or pneumonia. Electronically Signed   By: Ivar Drape M.D.   On: 02/04/2016 08:12   Dg Chest Port 1 View  Result Date: 02/03/2016 CLINICAL DATA:  Acute onset of shortness of breath. Initial encounter. EXAM: PORTABLE CHEST 1 VIEW COMPARISON:  Chest radiograph from 01/03/2016 FINDINGS: There is mildly worsened bibasilar airspace opacity, which could reflect recurrent or persistent pneumonia. Mild vascular congestion and peribronchial thickening are seen. No pleural effusion or pneumothorax is identified. The cardiomediastinal silhouette is normal in size. The patient's tracheostomy tube is seen ending 3-4 cm above the carina. No acute osseous abnormalities are seen. IMPRESSION: Mildly worsened bibasilar airspace opacity, which could reflect recurrent or persistent pneumonia, given clinical concern. Mild vascular congestion and peribronchial thickening seen. Electronically Signed   By: Garald Balding M.D.   On: 02/03/2016 19:19   No results for input(s): WBC, HGB, HCT, PLT in the last 72  hours. No results for input(s): NA, K, CL, GLUCOSE, BUN, CREATININE, CALCIUM in the last 72 hours.  Invalid input(s): CO CBG (last 3)  No results for input(s): GLUCAP in the last 72 hours.  Wt Readings from Last 3 Encounters:  02/04/16 53.8 kg (118 lb 9.7 oz)  02/03/16 52.2 kg (115 lb)  01/04/16 56.6 kg (124 lb 12.5 oz)    Physical Exam:  Constitutional: She appears well-developed. She has a sickly appearance.  Thin female with cuffed trach in place. No distress HENT:  Head: Normocephalicand atraumatic.  Continued debris on tongue, mucosa more moist Eyes: Conjunctivaeare normal. Pupils are equal, round, and reactive to light.  Neck:  #6 cuffed trach. No secretions this am. Cardiovascular: Occasionalextrasystolesare present. Tachycardiapresent.  Respiratory: Effort normal. No stridor. No respiratory distress. She has decreased BS in bases. Few rhonchi this morning. GI: Soft. Bowel sounds are normal. She exhibits no distension. There is no tenderness.  PEG site clean and dry. Musculoskeletal: She exhibits no edema.  Neurological: She is alert.  Nods head y/n to questions. Tries to mouth words.  Significant flexor tone LUE at biceps and brachioradialis 2-3/4. LLE : hamstring hip adductors 3/4. Motor 2-3/5 RUE and RLE with inconsistent effort. LUE and LLE potentially 1-2/5 but blocked by tone. (no change) Skin: Skin is warmand dry. Macular rash on buttocks/sacrum  Psychiatric: She is slowed. She is noncommunicative.  Flat but attempts to engage  Assessment/Plan: 1. Functional, cognitive, and mobility deficits secondary to encephalopathy/debility which require 3+ hours per day of interdisciplinary therapy in a comprehensive inpatient rehab setting. Physiatrist is providing close team supervision and 24 hour management of active medical problems listed below. Physiatrist and rehab team continue  to assess barriers to discharge/monitor patient progress toward functional and  medical goals.  Function:  Bathing Bathing position      Bathing parts      Bathing assist        Upper Body Dressing/Undressing Upper body dressing                    Upper body assist        Lower Body Dressing/Undressing Lower body dressing                                  Lower body assist        Toileting Toileting          Toileting assist     Transfers Chair/bed transfer             Locomotion Ambulation           Wheelchair          Cognition Comprehension    Expression    Social Interaction    Problem Solving    Memory     Medical Problem List and Plan: 1. Functional, mobility and cognitive/communication deficitssecondary to debility in setting of prior encephalopathy/tetraplegia  -beginning therapies today 2. DVT Prophylaxis/Anticoagulation: Pharmaceutical: Lovenox 3. Pain Management: tylenol prn 4. Mood: LCSW to follow for evaluation when appropriate 5. Neuropsych: This patient is notcapable of making decisions on herown behalf. 6. Skin/Wound Care: routine pressure relief measures, Prevalon boots 7. Fluids/Electrolytes/Nutrition: Continue Tube feeds 125 cc from 4 pm to 8 am. Water flushes 300 cc/ 5 X day. Will consult Dietician for input.  -I personally reviewed all of the patient's labs today, and lab work is within normal limits.  8. Spasticity: On baclofen qid and valium qid.  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  -ROM with therapy -botox LUE/LLE while here (last botox 11/05/15)---will schedule for Friday 9. Candida UTI/C diff colitis: has completed treatment.   -continue contact precautions 10 VDRF: Continue cuffed #6 trach. added nebulizers prn to help mobilize secretions. Add mucinex to loosen secretions. PMSV with supervision.   -cxr with right lower lobe  atelectasis vs pneumonia.   -appreciate pulmonary consult/recs     LOS (Days) 1 A FACE TO FACE EVALUATION WAS PERFORMED  Meredith Staggers, MD 02/04/2016 8:51 AM

## 2016-02-05 ENCOUNTER — Inpatient Hospital Stay (HOSPITAL_COMMUNITY): Payer: BC Managed Care – PPO | Admitting: Speech Pathology

## 2016-02-05 ENCOUNTER — Inpatient Hospital Stay (HOSPITAL_COMMUNITY): Payer: BC Managed Care – PPO | Admitting: Physical Therapy

## 2016-02-05 ENCOUNTER — Inpatient Hospital Stay (HOSPITAL_COMMUNITY): Payer: BC Managed Care – PPO | Admitting: Occupational Therapy

## 2016-02-05 DIAGNOSIS — J9611 Chronic respiratory failure with hypoxia: Secondary | ICD-10-CM

## 2016-02-05 NOTE — Progress Notes (Signed)
Occupational Therapy Session Note  Patient Details  Name: MIAMOR PIGOTT MRN: [id number] Date of Birth: 1950-09-06  Today's Date: 02/05/2016 OT Individual Time: [id number] OT Individual Time Calculation (min): 60 min     Short Term Goals:Week 1:  OT Short Term Goal 1 (Week 1): Will initiate timed toileting for skin protection OT Short Term Goal 2 (Week 1): Pt will initate forward lean during transfer with mod A and VCs OT Short Term Goal 3 (Week 1): Pt will complete low reach grasping with mod A in pre function task  Skilled Therapeutic Interventions/Progress Updates:    Therapist arrived for scheduled OT session at 7:30 respiratory therapist present placing pt on shaker machine. Will defer OT session until later in the day when procedure is complete.   Therapist returned at 8:50 and pt in supine with RN staff, husband, and personal care nurse present. Discussed with pt's husband and caregiver regarding family goals for OT, activity progression, and steps to reducing caregiver burden given pt's current level of function. Pt pointing to L leg when pain addressed. Pt pre-medicated prior to tx session, repositioned for comfort and increased breath support.  Pants donned total A in supine with +2 assist. Pt with increased tone in L LE into flexion making threading pants on and rolling increasingly difficult. She rolled with total A +1 to have pants pulled up. Bra and shirt donned in w/c with pt able to initiate threading of UEs into shirt sleeve.  Total A transfer to EOB. Total A +1 slidfing board transfer to w/c with +2 to steady equipment. Educated caregivers regarding weight shifting technique for sliding board transfer and for repositioning into back of w/c. Grooming completed at sink max- total A with hand over hand to wash face. Pt able to follow ~70% of 1 step commands.  Attempted wearing of PMSV, however, O2 levels droped in 60-70s with valve on. Rest breaks and VCs for deep breathing required  throughout session.  Pt left sitting up in w/c at end of session, husband and personal care nurse present.   Therapy Documentation Precautions:  Precautions Precautions: Fall Precaution Comments: Trach, PEG, supplemental O2, PMSV Required Braces or Orthoses: Other Brace/Splint Other Brace/Splint: Pt has B AFOs and UE splints Restrictions Weight Bearing Restrictions: No  See Function Navigator for Current Functional Status.   Therapy/Group: Individual Therapy  [name], Darian Cansler C 02/05/2016, 7:09 AM

## 2016-02-05 NOTE — Progress Notes (Signed)
Occupational Therapy Session Note  Patient Details  Name: Judy Spears MRN: [id number] Date of Birth: 01-11-1951  Today's Date: 02/05/2016 OT Co-tx time: 1500-1530 Total Time (30 min)    Short Term Goals: Week 1:  OT Short Term Goal 1 (Week 1): Will initiate timed toileting for skin protection OT Short Term Goal 2 (Week 1): Pt will initate forward lean during transfer with mod A and VCs OT Short Term Goal 3 (Week 1): Pt will complete low reach grasping with mod A in pre function task  Skilled Therapeutic Interventions/Progress Updates:    Pt seen for Co-tx with PT working on toilet transfer and toileting tasks. Pt in supine upon arrival, pt unable to void and now retaining fluid. Desiring to get pt up to attempt void. Completed total A +2 bed mobility ot EOB and sliding board transfer to padded tub bench with cut out. Completed lateral leans with one person assist while second person managed clothing. Pt able to sit on padded tub bench with back support and L UE support with close supervision during attempt to void. Unable to void. Pt leaned forward into modified squat for clothes to be pulled back up with +2 assist. Pt pointed to w/c and sliding board transfer completed from tub bench to w/c with +2 assist. Pt left sitting up in chair at end of session, personal care nurse present.   Therapy Documentation Precautions:  Precautions Precautions: Fall Precaution Comments: Trach, PEG, supplemental O2, PMSV Required Braces or Orthoses: Other Brace/Splint Other Brace/Splint: Pt has B AFOs and UE splints Restrictions Weight Bearing Restrictions: No  See Function Navigator for Current Functional Status.   Therapy/Group: Individual Therapy  [name], Jilda Kress C 02/05/2016, 3:55 PM

## 2016-02-05 NOTE — Progress Notes (Signed)
Social Work  Social Work Assessment and Plan  Patient Details  Name: Judy Spears MRN: [id number] Date of Birth: 27-Sep-1950  Today's Date: 02/05/2016  Problem List:  Patient Active Problem List   Diagnosis Date Noted  . Atelectasis 02/04/2016  . Debility 02/03/2016  . Pneumonia of both lower lobes due to Pseudomonas species ([place])   . Pneumonia of both lower lobes due to methicillin susceptible Staphylococcus aureus (MSSA) ([place])   . Acute respiratory failure with hypoxia ([place])   . Acute encephalopathy   . Seizures ([place])   . Sepsis ([place])   . Hypoxia   . Pain   . HCAP (healthcare-associated pneumonia) 12/28/2015  . Chronic respiratory failure ([place]) 12/28/2015  . Acute tracheobronchitis 12/24/2015  . Tracheostomy tube present ([place])   . Tracheal stenosis   . Chronic respiratory failure with hypoxia ([place])   . 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 Tracheostomy status ([place]) 09/26/2015  . Allergic rhinitis 09/26/2015  . 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 Movement disorder 09/22/2015  . Encephalitis 09/22/2015  . Chest pain 02/07/2014  . Premature atrial contractions 01/13/2014  . Abnormality of gait 12/04/2013  . Hyperlipidemia 12/21/2011  . Cardiovascular risk factor 12/21/2011  . Bunion 01/31/2008  . Metatarsalgia of both feet 09/20/2007  . FLAT FOOT 09/20/2007  . HALLUX RIGIDUS, ACQUIRED 09/20/2007   Past Medical History:  Past Medical History:  Diagnosis Date  . Arthritis    lt great toe  . Complication of anesthesia    pt has had headaches post op-did advise to hydra well preop  . Hair loss    right-sided  . History of exercise stress test    a. ETT (12/15) with 12:00 exercise, no ST changes, occasional PACs.   . Hyperlipidemia   . Insomnia   . Migraine headache   . 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 Postmenopausal   . Premature atrial contractions    a. Holter (12/15) with 8% PACs, no atrial runs or atrial fibrillation.    Past Surgical History:   Past Surgical History:  Procedure Laterality Date  . BREAST BIOPSY  01/01/2011   Procedure: BREAST BIOPSY WITH NEEDLE LOCALIZATION;  Surgeon: Rolm Bookbinder, MD;  Location: [facility];  Service: General;  Laterality: Left;  left breast wire localization biopsy  . cataracts right eye    . CESAREAN SECTION  1986  . HERNIA REPAIR  2000   RIH  . opirectinal membrane peel    . RHINOPLASTY  1983   Social History:  reports that she has never smoked. She has never used smokeless tobacco. She reports that she drinks alcohol. She reports that she does not use drugs.  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 Anticipated Caregiver: spouse, family and hired caregivers; [name]/caregiver Ability/Limitations of Caregiver: spouse and [name] caregiver have injured their backs with transfers Caregiver Availability: 24/7 Family Dynamics: Husband and other family very supprtive and involve.  Husband stayed with pt while at [facility] in [place] and comes to this hospital daily to be present for as many therapies as possible.  Social History Preferred language: English Religion: Christian Cultural Background: NA Education: Lawyer Read: Yes Write: Yes Employment  Status: Disabled Date Retired/Disabled/Unemployed: Nov 2016 Legal Hisotry/Current Legal Issues: None Guardian/Conservator: None -per MD, pt is not capable of making decisions on her own behalf - husband is her POA   Abuse/Neglect Physical Abuse: Denies Verbal Abuse: Denies Sexual Abuse: Denies Exploitation of patient/patient's resources: Denies Self-Neglect:  Denies  Emotional Status Pt's affect, behavior adn adjustment status: Pt with severe cognitive deficits and impaired communication.  She does smile when I introduce myself.  Unable to complete assessment interview and questions deferred to spouse to complete.  She does not appear to be experiencing any significant emotional distress at this time.  Will monitor per family report. Recent Psychosocial Issues: Became ill in Nov 2016 from a tick bite and has completed several months of inpatient and outpatient rehabilitation. Pyschiatric History: None Substance Abuse History: None  Patient / Family Perceptions, Expectations & Goals Pt/Family understanding of illness & functional limitations: Spouse with very good understanding of pt's recent decline due to PNA.  Very clearly describes the functional deficits that are new since this hospitalization. Premorbid pt/family roles/activities: Pt has required 24/7 physical care since Nov 2016 with spouse and private caregivers providing this. Anticipated changes in roles/activities/participation: No change anticipated as 24/7 care already in place. Pt/family expectations/goals: "I'd like to have her back to where she was before she got sick this time."  [employer]: None Premorbid Home Care/DME Agencies: Other (Comment) ([facility]) Transportation available at discharge: yes - family has w/c [facility]  Discharge Planning Living Arrangements: Spouse/significant other Support Systems: Spouse/significant other, Children, Other relatives, Friends/neighbors, Home care staff, Church/faith community Type of Residence: Private residence Insurance Resources: Multimedia programmer (specify) Printmaker) Financial Resources: Family Support Financial Screen Referred: No Living Expenses: Own Money Management: Spouse Does the patient have any problems obtaining your medications?: No Home Management: spouse and private  duty Patient/Family Preliminary Plans: Pt to return home with 24/7 care from caregiver "team" already in place. Social Work Anticipated Follow Up Needs: HH/OP Expected length of stay: ELOS 2- 4 weeks  Clinical Impression Very unfortunate woman who initially became hospitalized in Nov of 2016.  She has completed several months of rehab at [facility] and home to [place] since Oct.  Severe physical and cognitive deficits continue.  On CIR following treatment for PNA and re-trached at [facility], family and private duty caregivers are very involved and will resume 24/7 care upon d/c home.  Will follow for support and d/c planning needs.  Delvin Hedeen 02/05/2016, 3:52 PM

## 2016-02-05 NOTE — Care Management Note (Signed)
[facility] Individual Statement of Services  Patient Name:  Judy Spears  Date:  02/05/2016  Welcome to the [facility].  Our goal is to provide you with an individualized program based on your diagnosis and situation, designed to meet your specific needs.  With this comprehensive rehabilitation program, you will be expected to participate in at least 3 hours of rehabilitation therapies Monday-Friday, with modified therapy programming on the weekends.  Your rehabilitation program will include the following services:  Physical Therapy (PT), Occupational Therapy (OT), Speech Therapy (ST), 24 hour per day rehabilitation nursing, Therapeutic Recreaction (TR), Case Management (Social Worker), Rehabilitation Medicine, Nutrition Services and Pharmacy Services  Weekly team conferences will be held on Tuesdays to discuss your progress.  Your Social Worker will talk with you frequently to get your input and to update you on team discussions.  Team conferences with you and your family in attendance may also be held.  Expected length of stay: 2-4 weeks  Overall anticipated outcome: maximum assistance  Depending on your progress and recovery, your program may change. Your Social Worker will coordinate services and will keep you informed of any changes. Your Social Worker's name and contact numbers are listed  below.  The following services may also be recommended but are not provided by the [facility]:    [place] will be made to provide these services after discharge if needed.  Arrangements include referral to agencies that provide these services.  Your insurance has been verified to be:  [employer] Your primary doctor is:  Dr. Addison Lank  Pertinent information will be shared with your doctor and your insurance company.  Social Worker:  [place], [place] or ([phone]   Information discussed with and copy given to patient by: Lennart Pall, 02/05/2016, 3:55 PM

## 2016-02-05 NOTE — Progress Notes (Signed)
Name: Judy Spears MRN: [id number] DOB: Sep 13, 1950    ADMISSION DATE:  02/03/2016 CONSULTATION DATE:  02/03/16  REFERRING MD :  Judy Spears  CHIEF COMPLAINT:  VDRF  Brief  Judy Spears is a 66 y.o. 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  She initially had rehabilitation at [facility] in [place] and then returned to [place].  She had trach removed in May 2017 but unfortunately had complications of laryngospasm and had to have trach re-done 2 weeks later followed by tracheal stenosis partial resection and #4 cuffed tracheostomy placed by ENT in [facility] in June 2017.  Not long after that she was transferred back to her home city of [place] where she participated in ongoing rehabilitation. Pulmonary medicine was consulted for further evaluation. By September 2017 she had been maintained with her tracheostomy and a cuff-less state, capped 24 hours a day for several months. After an initial period of observation in [place] we electively decannulate her in October 2017. She did well for 6 weeks from a respiratory standpoint until she came down with a viral illness in late November 2017. Initially she tolerated this well but unfortunately not long afterwards she developed pneumonia with Pseudomonas and staph requiring recannulation with a tracheostomy and mechanical ventilation. She was treated with cefepime and electroencephalographic studies at our center were worrisome for status epilepticus. She was transferred to [facility] for further management. There was felt that the electrical activity was not consistent with epilepsy and antiepileptic medicines were eventually held. There she was treated for Klebsiella and Pseudomonas pneumonia with vancomycin, Zosyn, and meropenem. Most recent course of  antibiotics ended on 01/25/2016. She was also treated with C. difficile treatment (oral Flagyl and vancomycin) through 01/31/2016. She was also found to have pyelonephritis believed to be due to Candida and was treated with fluconazole through 02/01/2016.  She was treated with mechanical ventilation through 01/26/2016 and has been transitioned to a collar device since then. She has used a speaking valve some since then. She has had persistent mucus production and is able to cough it out around her #6 cuffed trach with the cuff deflated.  She still has thick brown secretions.  A CT scan performed on 01/25/2016 showed a left lower lobe pneumonia and likely pyelonephritis and evidence of diarrhea. There was also nonspecific pancreatic duct enlargement.  Since initial diagnosis, she has had complicated course; most recently over the past couple of months.     SUBJECTIVE:  No acute events.  Tolerated chest PT via vibra vest yesterday well. CXR today with slightly worse today but favor chronic changes with possible scarring versus acute infection.   VITAL SIGNS: Temp:  [97.6 F (36.4 C)-97.8 F (36.6 C)] 97.6 F (36.4 C) (01/11 0540) Pulse Rate:  [77-86] 86 (01/11 0736) Resp:  [19-22] 20 (01/11 0736) BP: (122-133)/(66-94) 122/66 (01/11 0540) SpO2:  [97 %-100 %] 98 % (01/11 0736) FiO2 (%):  [28 %-40 %] 28 % (01/11 0736)  PHYSICAL EXAMINATION: General: Chronically ill appearing female, sitting up in chair working with PT. Neuro: Awake, tracks appropriately.  Followed simple commands - working with PT. HEENT: [place]/AT. PERRL, sclerae anicteric.  Trach C/D/I, #6 cuffed. Cardiovascular: RRR, no M/R/G.  Lungs: Respirations even and unlabored.  Coarse breath shounds  Bilaterally though improved. Abdomen: PEG in place.  BS x 4, soft,  NT/ND.  Musculoskeletal: No gross deformities, no edema.  Skin: Intact, warm, no rashes.    No results for input(s): NA, K, CL, CO2, BUN, CREATININE, GLUCOSE in the  last 168 hours. No results for input(s): HGB, HCT, WBC, PLT in the last 168 hours. Dg Chest Port 1 View  Result Date: 02/04/2016 CLINICAL DATA:  History of pneumonia, followup EXAM: PORTABLE CHEST 1 VIEW COMPARISON:  Portable chest x-ray 02/03/2016 FINDINGS: There has been an increase in opacity at the right lung base consistent with atelectasis or pneumonia. Mild atelectasis remains at the left lung base. No definite effusion is seen. The heart is within upper limits normal. Tracheostomy remains. IMPRESSION: Increase in opacity at the right lung base consistent with atelectasis or pneumonia. Electronically Signed   By: Judy Spears M.D.   On: 02/04/2016 08:12   Dg Chest Port 1 View  Result Date: 02/03/2016 CLINICAL DATA:  Acute onset of shortness of breath. Initial encounter. EXAM: PORTABLE CHEST 1 VIEW COMPARISON:  Chest radiograph from 01/03/2016 FINDINGS: There is mildly worsened bibasilar airspace opacity, which could reflect recurrent or persistent pneumonia. Mild vascular congestion and peribronchial thickening are seen. No pleural effusion or pneumothorax is identified. The cardiomediastinal silhouette is normal in size. The patient's tracheostomy tube is seen ending 3-4 cm above the carina. No acute osseous abnormalities are seen. IMPRESSION: Mildly worsened bibasilar airspace opacity, which could reflect recurrent or persistent pneumonia, given clinical concern. Mild vascular congestion and peribronchial thickening seen. Electronically Signed   By: Garald Spears M.D.   On: 02/03/2016 19:19    STUDIES:  01/25/2016 CT chest/ab/pelvis [facility]: 1) likely peylo left kidney, 2) diarrhea in colon, 3) Ground glass in RML and RLL likely pneumonia vs aspiration, near complete left lower lobe consolidation with distal airways plugging, 4) compression fractures T1 and L2-L4, 5) 13 mm soft tissue nodule left upper breast 6) mildly dilated pancreatic duct indeterminate etiology with pancreatic  atropy  SIGNIFICANT EVENTS    ASSESSMENT / PLAN:  66 year old female with a past medical history significant for Powassen virus encephalitis in November 2016 leading to significant neurologic injury and prolonged rehab course comes back to [facility] rehabilitation after a recent hospitalization for healthcare associated pneumonia leading to respiratory failure, C. difficile, and a neurologic evaluation for possible epilepsy. Fortunately there is no evidence of status epilepticus. Pulmonary and critical care medicine is consulted for assistance with management of pulmonary secretions and tracheostomy.  Healthcare associated pneumonia: Recently treated with IV antibiotics. CXRs 's likely going to be abnormal considering her multiple recent episodes of pneumonia.  PLAN - Hold off on antibiotics unless respiratory status changes or chest x-ray findings conitnue to worsen (favor chronic changes / scarring versus true infection). - Get cxr again on Friday 02/06/16. - Agree with speech therapy evaluation, eventually will need to repeat swallowing evaluation. - await respiratory culture. - continue guaifenesin scheduled. - conntinue hypertonic saline daily to facilitate mucus clearance. - albuterol prn. - continue chest pt bid, can drop down to qd 01/12 or 01/13.  Tracheostomy status: - Continue tracheostomy care per routine. - If her respiratory status remains stable we will change trach per protocol.   Montey Hora, Rafael Capo Pulmonary & Critical Care Medicine Pager: [phone]  or [phone] 02/05/2016, 11:21 AM

## 2016-02-05 NOTE — Evaluation (Signed)
Speech Language Pathology Assessment and Plan  Patient Details  Name: Judy Spears MRN: [id number] Date of Birth: 12/18/1950  SLP Diagnosis: Cognitive Impairments;Speech and Language deficits;Voice disorder  Rehab Potential: Good ELOS: TBD    Today's Date: 02/05/2016 SLP Individual Time: 1100-1200 Total Individual Time: 60 minutes       Problem List: Patient Active Problem List   Diagnosis Date Noted  . Atelectasis 02/04/2016  . Debility 02/03/2016  . Pneumonia of both lower lobes due to Pseudomonas species ([place])   . Pneumonia of both lower lobes due to methicillin susceptible Staphylococcus aureus (MSSA) ([place])   . Acute respiratory failure with hypoxia ([place])   . Acute encephalopathy   . Seizures ([place])   . Sepsis ([place])   . Hypoxia   . Pain   . HCAP (healthcare-associated pneumonia) 12/28/2015  . Chronic respiratory failure ([place]) 12/28/2015  . Acute tracheobronchitis 12/24/2015  . Tracheostomy tube present ([place])   . Tracheal stenosis   . Chronic respiratory failure with hypoxia ([place])   . 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 Tracheostomy status ([place]) 09/26/2015  . Allergic rhinitis 09/26/2015  . 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 Movement disorder 09/22/2015  . Encephalitis 09/22/2015  . Chest pain 02/07/2014  . Premature atrial contractions 01/13/2014  . Abnormality of gait 12/04/2013  . Hyperlipidemia 12/21/2011  . Cardiovascular risk factor 12/21/2011  . Bunion 01/31/2008  . Metatarsalgia of both feet 09/20/2007  . FLAT FOOT 09/20/2007  . HALLUX RIGIDUS, ACQUIRED 09/20/2007   Past Medical History:  Past Medical History:  Diagnosis Date  . Arthritis    lt great toe  . Complication of anesthesia    pt has had headaches post op-did advise to hydra well preop  . Hair loss    right-sided  . History of exercise stress test    a. ETT (12/15) with 12:00 exercise, no ST changes, occasional PACs.   . Hyperlipidemia   . Insomnia   . Migraine headache   .  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 Postmenopausal   . Premature atrial contractions    a. Holter (12/15) with 8% PACs, no atrial runs or atrial fibrillation.    Past Surgical History:  Past Surgical History:  Procedure Laterality Date  . BREAST BIOPSY  01/01/2011   Procedure: BREAST BIOPSY WITH NEEDLE LOCALIZATION;  Surgeon: Rolm Bookbinder, MD;  Location: [place];  Service: General;  Laterality: Left;  left breast wire localization biopsy  . cataracts right eye    . CESAREAN SECTION  1986  . HERNIA REPAIR  2000   RIH  . opirectinal membrane peel    . RHINOPLASTY  1983    Assessment / Plan / Recommendation Clinical Impression 66 year old female with history of encephalitis due to Powassen virus 63/8466 complicated by VDRF, R-VC paralysis, tracheal stenosis s/p repair, spasticity, restlessness, recent admission 12/28/2015 for acute respiratory failure due to Pseudomonas and MSSA multilobar HCAP. Hospital course significant for vent dependence with need for recanalization of trach and decline in MS due to refractory seizures despite multiple AEDs. She was transferred to [facility] on 12/10 for management and monitoring. 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  LTM without classic seizure activity and no clear seizures noted. Neurology felt that patient with SIRPDs (Stimulus-induced Rhythmic, Periodic or Ictal discharges)and she was weaned off AEDs. She  continued to have fevers despite completion of PNA treatment and was found to be C diff positive on 12/27 and have candida UTI. She has been treated with diflucan as well as po Vanc/Flagyl. She was weaned to ATC on 01/1 and tolerating PMSV with tachypnea and RR up to 33 but no distress reported. Foley and rectal tube discontinued yesterday.  Therapy ongoing and patient noted to be debilitated. CIR recommended for follow up therapy and records reviewed by MD/Rehab coordinator. She was felt to be a food CIR candidate and patient 02/03/16.   PMSV was donned and patient wore valve for 60 minutes with all vitals remaining WFL. Patient with intermittent coughing throughout session but unable to expectorate secretions. Patient demonstrated severe dysarthria characterized by weak oral-motor musculature and decreased breath support resulting in dysphonia and imprecise consonants leading to less then 25% intelligibility. Patient followed basic 1 step commands with extra time and could use head nods to answer yes/no questions. Patient maintained alertness throughout session but demonstrated decreased sustained attention to tasks. BSE was not completed today due to severely impaired respiratory function at this time. Patient would benefit from skilled SLP intervention to maximize her overall functional communication prior to discharge.    Skilled Therapeutic Interventions          Administered a cognitive-linguistic and PMSV evaluation. Please see above for details.   SLP Assessment  Patient will need skilled [place] Pathology Services during CIR admission    Recommendations  Patient destination: Home Follow up Recommendations: 24 hour supervision/assistance;Home Health SLP Equipment Recommended: To be determined    SLP Frequency 3 to 5 out of 7 days   SLP Duration  SLP Intensity  SLP Treatment/Interventions TBD  Minumum of 1-2 x/day, 30 to 90 minutes  Cognitive remediation/compensation;Cueing hierarchy;Environmental controls;Internal/external aids;Speech/Language facilitation;Therapeutic Activities;Functional tasks;Patient/family education    Pain No/Denies Pain    Short Term Goals: Week 1: SLP Short Term Goal 1 (Week 1): Patient will demonstrate intelligibility at the word level 25% of the time with Max A multimodal cues for  use of speech intelligibility strategies.  SLP Short Term Goal 2 (Week 1): Patient will perform diaphragmatic breathing exercises with Max A multimodal cues at the vowel and word level.  SLP Short Term Goal 3 (Week 1): Patient will perform phrase completion tasks with 50% accuracy and Max A multimodal cues.  SLP Short Term Goal 4 (Week 1): Patient will utilize multimodal communication to express wants/needs with Max A multimodal cues.   Refer to Care Plan for Long Term Goals  Recommendations for other services: None   Discharge Criteria: Patient will be discharged from SLP if patient refuses treatment 3 consecutive times without medical reason, if treatment goals not met, if there is a change in medical status, if patient makes no progress towards goals or if patient is discharged from hospital.  The above assessment, treatment plan, treatment alternatives and goals were discussed and mutually agreed upon: by patient and by family  Wandra Babin 02/05/2016, 6:57 AM

## 2016-02-05 NOTE — Progress Notes (Signed)
[facility] PHYSICAL MEDICINE & REHABILITATION     PROGRESS NOTE    Subjective/Complaints: Had a reasonable night. Slept well. No issues this morning per RN.   ROS: limited by cognition   Objective: Vital Signs: Blood pressure 122/66, pulse 85, temperature 97.6 F (36.4 C), temperature source Oral, resp. rate 19, height 5\' 4"  (1.626 m), weight 53.8 kg (118 lb 9.7 oz), last menstrual period 12/01/2010, SpO2 98 %. Dg Chest Port 1 View  Result Date: 02/04/2016 CLINICAL DATA:  History of pneumonia, followup EXAM: PORTABLE CHEST 1 VIEW COMPARISON:  Portable chest x-ray 02/03/2016 FINDINGS: There has been an increase in opacity at the right lung base consistent with atelectasis or pneumonia. Mild atelectasis remains at the left lung base. No definite effusion is seen. The heart is within upper limits normal. Tracheostomy remains. IMPRESSION: Increase in opacity at the right lung base consistent with atelectasis or pneumonia. Electronically Signed   By: Ivar Drape M.D.   On: 02/04/2016 08:12   Dg Chest Port 1 View  Result Date: 02/03/2016 CLINICAL DATA:  Acute onset of shortness of breath. Initial encounter. EXAM: PORTABLE CHEST 1 VIEW COMPARISON:  Chest radiograph from 01/03/2016 FINDINGS: There is mildly worsened bibasilar airspace opacity, which could reflect recurrent or persistent pneumonia. Mild vascular congestion and peribronchial thickening are seen. No pleural effusion or pneumothorax is identified. The cardiomediastinal silhouette is normal in size. The patient's tracheostomy tube is seen ending 3-4 cm above the carina. No acute osseous abnormalities are seen. IMPRESSION: Mildly worsened bibasilar airspace opacity, which could reflect recurrent or persistent pneumonia, given clinical concern. Mild vascular congestion and peribronchial thickening seen. Electronically Signed   By: Garald Balding M.D.   On: 02/03/2016 19:19   No results for input(s): WBC, HGB, HCT, PLT in the last 72  hours. No results for input(s): NA, K, CL, GLUCOSE, BUN, CREATININE, CALCIUM in the last 72 hours.  Invalid input(s): CO CBG (last 3)  No results for input(s): GLUCAP in the last 72 hours.  Wt Readings from Last 3 Encounters:  02/04/16 53.8 kg (118 lb 9.7 oz)  02/03/16 52.2 kg (115 lb)  01/04/16 56.6 kg (124 lb 12.5 oz)    Physical Exam:  Constitutional: She appears well-developed. She has a sickly appearance.  Thin female with cuffed trach in place. No distress HENT:  Head: Normocephalicand atraumatic.  Continued debris on tongue, mucosa more moist Eyes: Conjunctivaeare normal. Pupils are equal, round, and reactive to light.  Neck:  #6 cuffed trach. No secretions this am. Cardiovascular: Occasionalextrasystolesare present. Tachycardiapresent.  Respiratory: Effort normal. No stridor. No respiratory distress. She has decreased BS in bases. Few rhonchi this morning. GI: Soft. Bowel sounds are normal. She exhibits no distension. There is no tenderness.  PEG site clean and dry. Musculoskeletal: She exhibits no edema.  Neurological: She is alert.  Nods head y/n to questions. Tries to mouth words.  Significant flexor tone LUE at biceps and brachioradialis 2-3/4. LLE : hamstring hip adductors 3/4. Motor 2-3/5 RUE and RLE with inconsistent effort. LUE and LLE potentially 1-2/5 but blocked by tone. (no change) Skin: Skin is warmand dry. Macular rash on buttocks/sacrum  Psychiatric: She is slowed. She is noncommunicative.  Flat but attempts to engage  Assessment/Plan: 1. Functional, cognitive, and mobility deficits secondary to encephalopathy/debility which require 3+ hours per day of interdisciplinary therapy in a comprehensive inpatient rehab setting. Physiatrist is providing close team supervision and 24 hour management of active medical problems listed below. Physiatrist and rehab team continue  to assess barriers to discharge/monitor patient progress toward functional and  medical goals.  Function:  Bathing Bathing position   Position: Bed  Bathing parts   Body parts bathed by helper: Right arm, Right upper leg, Left arm, Left upper leg, Chest, Abdomen, Right lower leg, Front perineal area, Left lower leg, Buttocks, Back  Bathing assist Assist Level: 2 helpers      Upper Body Dressing/Undressing Upper body dressing   What is the patient wearing?: Pull over shirt/dress       Pull over shirt/dress - Perfomed by helper: Thread/unthread right sleeve, Thread/unthread left sleeve, Put head through opening, Pull shirt over trunk        Upper body assist Assist Level: 2 helpers      Lower Body Dressing/Undressing Lower body dressing   What is the patient wearing?: Pants, Socks, Shoes       Pants- Performed by helper: Thread/unthread right pants leg, Thread/unthread left pants leg, Pull pants up/down, Fasten/unfasten pants       Socks - Performed by helper: Don/doff right sock, Don/doff left sock   Shoes - Performed by helper: Don/doff right shoe, Don/doff left shoe, Fasten right, Fasten left          Lower body assist Assist for lower body dressing: 2 Medical sales representative     Toileting steps completed by helper: Adjust clothing prior to toileting, Performs perineal hygiene, Adjust clothing after toileting    Toileting assist Assist level: Two helpers (Bed level)   Transfers Chair/bed transfer   Chair/bed transfer method: Squat pivot Chair/bed transfer assist level: 2 helpers       Locomotion Ambulation Ambulation activity did not occur: Safety/medical concerns         Wheelchair       Assist Level: Dependent (Pt equals 0%)  Cognition Comprehension Comprehension assist level: Understands basic 50 - 74% of the time/ requires cueing 25 - 49% of the time  Expression Expression assist level: Expresses basis less than 25% of the time/requires cueing >75% of the time.  Social Interaction Social Interaction assist  level: Interacts appropriately 25 - 49% of time - Needs frequent redirection.  Problem Solving Problem solving assist level: Solves basic less than 25% of the time - needs direction nearly all the time or does not effectively solve problems and may need a restraint for safety  Memory Memory assist level: Recognizes or recalls less than 25% of the time/requires cueing greater than 75% of the time   Medical Problem List and Plan: 1. Functional, mobility and cognitive/communication deficitssecondary to debility in setting of prior encephalopathy/tetraplegia  -continue PT,OT, SLP 2. DVT Prophylaxis/Anticoagulation: Pharmaceutical: Lovenox 3. Pain Management: tylenol prn 4. Mood: LCSW to follow for evaluation when appropriate 5. Neuropsych: This patient is notcapable of making decisions on herown behalf. 6. Skin/Wound Care: routine pressure relief measures, Prevalon boots 7. Fluids/Electrolytes/Nutrition: Continue Tube feeds 125 cc from 4 pm to 8 am. Water flushes 300 cc/ 5 X day. Will consult Dietician for input.  -continue to monitor   8. Spasticity: On baclofen qid and valium qid.  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  -ROM with therapy -botox LUE/LLE while here (last botox 11/05/15)---will perform Friday 9. Candida UTI/C diff colitis: has completed treatment.   -continue contact precautions 10 VDRF: Continue cuffed #6 trach. added nebulizers prn to help mobilize secretions. Add mucinex to loosen secretions. PMSV with supervision.   -  cxr with right lower lobe atelectasis vs pneumonia--continue to monitor closely--no change in status  -appreciate pulmonary consult/recs  -continue nebs, chest pt, OOB, suction, suptum cx ordered        LOS (Days) 2 A FACE TO FACE EVALUATION WAS PERFORMED  Meredith Staggers, MD 02/05/2016 8:22 AM

## 2016-02-05 NOTE — Progress Notes (Signed)
Called to bedside d/t ? If trach is in place.  Pt w/ Sat 97%, no distress noted.  Pt w/ audible air movement inhale/exhale through trach.  Trach appears in, BBSH = t/o coarse clear.  EZ Cap + purple to yellow color change.  While in room, pt began to cough and appeared to vomit thick mucous, then vomited watery substance.  RN in room, oral suctioned immediately, Suctioned trach w/ small amount thin white/clear secretions. No distress currently noted, VSS presently.  RN at bedside performing mouth care and cleaning up pt.

## 2016-02-05 NOTE — Progress Notes (Signed)
Deep suctioned patient 4 times this shift.  During two separate incidences, the patient gagged while coughing secretions and vomited.  The first time she vomited mucous.  The second time she vomited dark brown liquid.  Notified Pam Love, PA of findings, orders written for neb treatments to loosen mucous and to continue humidified oxygen along with suctioning as needed.  Patients husband verbalized he changed patients inner cannula at approximately 0830 this morning, but according to caregiver, did not deep suction, although he has performed this task before as she had the trach at home.  Shortly after I deep suctioned patient and a small bright red bloody mucous plug returned. Patient was unable to urinate today despite several different tries of getting to bedside commode and bedpan.  Intermittent cathed patient for 400 ml of amber urine with moderate amount of sediment.  Notified Algis Liming, PA of these findings as well with no new orders written.  Patient now resting in bed with nocturnal tube feeding running.  Appears to be in no immediate distress at this time.  Brita Romp, RN

## 2016-02-05 NOTE — Progress Notes (Signed)
Speech Language Pathology Daily Session Note  Patient Details  Name: Judy Spears MRN: [id number] Date of Birth: July 10, 1950  Today's Date: 02/05/2016 SLP Individual Time: 1300-1345 SLP Individual Time Calculation (min): 45 min   Short Term Goals: Week 1: SLP Short Term Goal 1 (Week 1): Patient will demonstrate intelligibility at the word level 25% of the time with Max A multimodal cues for use of speech intelligibility strategies.  SLP Short Term Goal 2 (Week 1): Patient will perform diaphragmatic breathing exercises with Max A multimodal cues at the vowel and word level.  SLP Short Term Goal 3 (Week 1): Patient will perform phrase completion tasks with 50% accuracy and Max A multimodal cues.  SLP Short Term Goal 4 (Week 1): Patient will utilize multimodal communication to express wants/needs with Max A multimodal cues.  SLP Short Term Goal 5 (Week 1): Patient will tolerate PMSV for 60 minutes with all vitals remaining WFL.   Skilled Therapeutic Interventions: Skilled treatment session focused on speech goals. SLP facilitated session by donning PMSV X 4 attempts. Patient with immediate coughing and increase work of breathing with air trapping evident after 30-60 seconds despite multiple interventions (cleaning inner cannula, etc). RN and PA aware. Patient attempting to mouth words and was ~10% intelligible in verbalzing yes/no in regards to wants/needs. Patient left upright in wheelchair with all needs within reach. Continue with current plan of care.   Function:  Eating Eating Eating activity did not occur: Safety/medical concerns   Eating Assist Level: Helper performs IV, parenteral or tube feed           Cognition Comprehension Comprehension assist level: Understands basic 50 - 74% of the time/ requires cueing 25 - 49% of the time  Expression Expression assistive device: Talk trach valve Expression assist level: Expresses basic 25 - 49% of the time/requires cueing 50 - 75% of the  time. Uses single words/gestures.  Social Interaction Social Interaction assist level: Interacts appropriately 50 - 74% of the time - May be physically or verbally inappropriate.  Problem Solving Problem solving assist level: Solves basic less than 25% of the time - needs direction nearly all the time or does not effectively solve problems and may need a restraint for safety  Memory Memory assist level: Recognizes or recalls 25 - 49% of the time/requires cueing 50 - 75% of the time    Pain No/Denies Pain   Therapy/Group: Individual Therapy  Demetry Bendickson 02/05/2016, 3:55 PM

## 2016-02-05 NOTE — Progress Notes (Signed)
RT in to start CPT and check trach.  Tube feeding noted to be running.  I suctioned a moderate amt of secretions from pt so CPT held at this time.

## 2016-02-05 NOTE — Progress Notes (Addendum)
Physical Therapy Session Note  Patient Details  Name: Judy Spears MRN: [id number] Date of Birth: October 19, 1950  Today's Date: 02/05/2016 PT Co-Treatment Time: 1530-1540 PT Co-Treatment Time Calculation (min): 10 min   Short Term Goals: Week 1:  PT Short Term Goal 1 (Week 1): Pt will demonstrate rolling R/L totalA +1 PT Short Term Goal 2 (Week 1): Pt will demonstrate supine<>sit with totalA +1 PT Short Term Goal 3 (Week 1): Pt will demonstrate static sitting balance x5 min with stable vitals and BUE support PT Short Term Goal 4 (Week 1): Pt will demonstrate transfer w/c <>bed with totalA +1 PT Short Term Goal 5 (Week 1): Pt will demonstrate standing tolerance with lift equipment x5 min with stable vitals  Skilled Therapeutic Interventions/Progress Updates:   Skilled co-treat with OT for focus on toileting on BSC. Performed transfer bed>padded tub bench>w/c with transfer board and totalA +2. Sitting balance on tub bench while attempting to urinate with variable close S>modA with significant L lateral lean. Positioning in R sidelying on elbow for L trunk lengthening, held while transfer board placed under LLE. Partial sit <>stand x 2 trials to A with donning/doffing pants and brief. Pt with productive cough throughout session, cleared through trach with external suction.  Remained seated in w/c at end of session with caregiver present; RN alerted to pt position.   Therapy Documentation Precautions:  Precautions Precautions: Fall Precaution Comments: Trach, PEG, supplemental O2, PMSV Required Braces or Orthoses: Other Brace/Splint Other Brace/Splint: Pt has B AFOs and UE splints Restrictions Weight Bearing Restrictions: No Pain:  Denies pain   See Function Navigator for Current Functional Status.   Therapy/Group: Co-Treatment  Luberta Mutter 02/05/2016, 3:48 PM

## 2016-02-05 NOTE — Progress Notes (Signed)
Physical Therapy Session Note  Patient Details  Name: Judy Spears MRN: [id number] Date of Birth: [birth year]  Today's Date: 02/05/2016 PT Individual Time: 1100-1218 PT Individual Time Calculation (min): 78 min    Short Term Goals: Week 1:  PT Short Term Goal 1 (Week 1): Pt will demonstrate rolling R/L totalA +1 PT Short Term Goal 2 (Week 1): Pt will demonstrate supine<>sit with totalA +1 PT Short Term Goal 3 (Week 1): Pt will demonstrate static sitting balance x5 min with stable vitals and BUE support PT Short Term Goal 4 (Week 1): Pt will demonstrate transfer w/c <>bed with totalA +1 PT Short Term Goal 5 (Week 1): Pt will demonstrate standing tolerance with lift equipment x5 min with stable vitals  Skilled Therapeutic Interventions/Progress Updates:   Tx 1: Pt received seated in w/c, denies pain and agreeable to treatment. Transported to/from gym dependent. Transfer w/c <>mat table with lateral scoot, transfer board, use of arm rests, 3" step under feet, and +2A for safety. Pt follows one step commands for hand placement, however needs assist to complete reaching task due to tone. Sitting balance with close S/min guard; cues for R lateral weight shift d/t L trunk shortening and significant L lateral lean so far that L elbow was on mat. R sidelying on elbow and on side with towel roll under R trunk for L trunk lengthening; manual overpressure to L thorax to lengthen and relax L obliques. Sidelying <>supine in wedge with modA transitional rolling, pt initiating LUE movement and head positioning to assist with initiation however limited d/t tone and coordination deficits. Supine bridging x10 reps with AAROM to increase range of hip extension. Returned to w/c as above; remained seated in w/c at end of session, caregiver present and all needs in reach.    Therapy Documentation Precautions:  Precautions Precautions: Fall Precaution Comments: Trach, PEG, supplemental O2, PMSV Required Braces or  Orthoses: Other Brace/Splint Other Brace/Splint: Pt has B AFOs and UE splints Restrictions Weight Bearing Restrictions: No Pain: Pain Assessment Pain Assessment: No/denies pain Pain Score: 0-No pain   See Function Navigator for Current Functional Status.   Therapy/Group: Individual Therapy  Luberta Mutter 02/05/2016, 1:12 PM

## 2016-02-06 ENCOUNTER — Inpatient Hospital Stay (HOSPITAL_COMMUNITY): Payer: BC Managed Care – PPO | Admitting: Occupational Therapy

## 2016-02-06 ENCOUNTER — Inpatient Hospital Stay (HOSPITAL_COMMUNITY): Payer: BC Managed Care – PPO

## 2016-02-06 ENCOUNTER — Inpatient Hospital Stay (HOSPITAL_COMMUNITY): Payer: BC Managed Care – PPO | Admitting: Speech Pathology

## 2016-02-06 ENCOUNTER — Inpatient Hospital Stay (HOSPITAL_COMMUNITY): Payer: BC Managed Care – PPO | Admitting: Physical Therapy

## 2016-02-06 MED ORDER — ACETYLCYSTEINE 10 % IN SOLN
4.0000 mL | Freq: Three times a day (TID) | RESPIRATORY_TRACT | Status: DC
  Administered 2016-02-06: 4 mL via RESPIRATORY_TRACT
  Filled 2016-02-06 (×3): qty 4

## 2016-02-06 MED ORDER — ACETYLCYSTEINE 20 % IN SOLN
4.0000 mL | Freq: Three times a day (TID) | RESPIRATORY_TRACT | Status: AC
  Administered 2016-02-06 – 2016-02-07 (×3): 4 mL via RESPIRATORY_TRACT
  Filled 2016-02-06 (×3): qty 4

## 2016-02-06 MED ORDER — JEVITY 1.2 CAL PO LIQD: 1000.0000 mL | ORAL | Status: DC

## 2016-02-06 MED ORDER — GERHARDT'S BUTT CREAM
TOPICAL_CREAM | Freq: Four times a day (QID) | CUTANEOUS | Status: DC
  Administered 2016-02-06 – 2016-02-24 (×70): via TOPICAL
  Filled 2016-02-06 (×3): qty 1

## 2016-02-06 MED ORDER — SODIUM CHLORIDE 3 % IN NEBU
4.0000 mL | INHALATION_SOLUTION | Freq: Every day | RESPIRATORY_TRACT | Status: AC
  Administered 2016-02-07 – 2016-02-08 (×2): 4 mL via RESPIRATORY_TRACT
  Filled 2016-02-06 (×3): qty 4

## 2016-02-06 NOTE — IPOC Note (Signed)
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[place]) Active Problems:   Tracheostomy status ([place])   Chronic respiratory failure with hypoxia ([place])   Debility   Atelectasis     Functional Problem List: Nursing Bladder, Bowel, Endurance, Nutrition, Pain, Motor, Medication Management, Skin Integrity, Safety, Sensory, Perception  PT Balance, Sensory, Skin Integrity, Endurance, Motor, Pain  OT Balance, Cognition, Endurance, Motor, Pain, Sensory, Safety, Perception, Vision, Skin Integrity  SLP    TR         Basic ADL's: OT Grooming, Bathing, Dressing, Toileting     Advanced  ADL's: OT       Transfers: PT Bed Mobility, Bed to Chair  OT Toilet     Locomotion: PT       Additional Impairments: OT Fuctional Use of Upper Extremity  SLP Communication, Social Cognition comprehension, expression Social Interaction, Problem Solving, Memory, Attention, Awareness  TR      Anticipated Outcomes Item Anticipated Outcome  Self Feeding N/A, pt on tube feeds PTA  Swallowing      Basic self-care  Total A  Toileting  Total A   Bathroom Transfers Total A  Bowel/Bladder  Pt will manage bowel and bladder with max assist with lessened episodes of incontinence with timed toileting and no signs and symptoms of infection  Transfers  maxA   Locomotion  dependent w/c propulsion  Communication  Max A   Cognition  Max A  Pain  Pt will manage pain at 4 or less on a scale of 0-10.   Safety/Judgment  Pt will remain free of falls and injuries with max assist    Therapy Plan: PT Intensity: Minimum of 1-2 x/day ,45 to 90 minutes PT Frequency: 5 out of 7 days PT Duration Estimated Length of Stay: 21-24 days OT Intensity: Minimum of 1-2 x/day, 45 to 90 minutes OT Frequency: 5 out of 7 days OT Duration/Estimated Length of  Stay: 21-24 days SLP Intensity: Minumum of 1-2 x/day, 30 to 90 minutes SLP Frequency: 3 to 5 out of 7 days SLP Duration/Estimated Length of Stay: TBD       Team Interventions: Nursing Interventions Bladder Management, Bowel Management, Patient/Family Education, Disease Management/Prevention, Pain Management, Medication Management, Skin Care/Wound Management, Dysphagia/Aspiration Precaution Training, Cognitive Remediation/Compensation, Discharge Planning  PT interventions Therapeutic Activities, Therapeutic Exercise, UE/LE Coordination activities, UE/LE Strength taining/ROM, Patient/family education, Functional mobility training, Disease management/prevention, Academic librarian, Ambulation/gait training, Discharge planning, Training and development officer, DME/adaptive equipment instruction, Neuromuscular re-education, Wheelchair propulsion/positioning  OT Interventions Training and development officer, DME/adaptive equipment instruction, Discharge planning, Functional electrical stimulation, Functional mobility training, Neuromuscular re-education, Psychosocial support, Patient/family education, Pain management, Self Care/advanced ADL retraining, Splinting/orthotics, UE/LE Strength taining/ROM, Therapeutic Exercise, Therapeutic Activities, UE/LE Coordination activities, Wheelchair propulsion/positioning  SLP Interventions Cognitive remediation/compensation, English as a second language teacher, Environmental controls, Internal/external aids, Speech/Language facilitation, Therapeutic Activities, Functional tasks, Patient/family education  TR Interventions    SW/CM Interventions Discharge Planning, Psychosocial Support, Patient/Family Education    Team Discharge Planning: Destination: PT-Home ,OT- Home , SLP-Home Projected Follow-up: PT-Home health PT, OT-  Home health OT, 24 hour supervision/assistance, SLP-24 hour supervision/assistance, Home Health SLP Projected Equipment Needs: PT-None recommended by PT, OT- None  recommended by OT, SLP-To be determined Equipment Details: PT- , OT-Pt has all needed DME Patient/family involved in discharge planning: PT- Family member/caregiver,  OT-Patient, Family member/caregiver, SLP-Patient, Family member/caregiver  MD ELOS: 21-24 Medical Rehab Prognosis:  Excellent Assessment:  The patient has been admitted for CIR therapies with the diagnosis of debility,spastic tetraplegia. The team will be addressing functional mobility, strength, stamina, balance, safety, adaptive techniques and equipment, self-care, bowel and bladder mgt, patient and caregiver education, spasticity mgt, orthotics, botox, pain control, respiratory/tach care, family/caregiver education, cognition, communication. Goals have been set at max to total assistance for self-care, mobility, cognition and communication. Pt will likely need long term trach.    Meredith Staggers, MD, FAAPMR      See Team Conference Notes for weekly updates to the plan of care

## 2016-02-06 NOTE — Progress Notes (Signed)
[place] PHYSICAL MEDICINE & REHABILITATION     PROGRESS NOTE    Subjective/Complaints: No new issues overnight. Suctioning required PRN. No temps or increase in secretions. Did have more difficulty tolerating PMV this morning with SLP  ROS limited by cognition  Objective: Vital Signs: Blood pressure 130/78, pulse 80, temperature 98 F (36.7 C), temperature source Oral, resp. rate 18, height 5\' 4"  (1.626 m), weight 53.8 kg (118 lb 9.7 oz), last menstrual period 12/01/2010, SpO2 100 %. Dg Chest Port 1 View  Result Date: 02/06/2016 CLINICAL DATA:  Hypoxia. EXAM: PORTABLE CHEST 1 VIEW COMPARISON:  02/04/2016 .  01/03/2016.  01/01/2016. FINDINGS: Tracheostomy tube noted stable position. Heart size stable. Persistent dense subsegmental atelectasis in the right base. Associated pneumonia cannot be excluded. Mild left base subsegmental atelectasis. Small left pleural effusion .  No pneumothorax . IMPRESSION: 1. Tracheostomy tube in stable position. 2. Persistent prominent right base subsegmental atelectasis. Infiltrate cannot be excluded. 3. Mild left base subsegmental atelectasis and tiny left pleural effusion . Electronically Signed   By: Marcello Moores  Register   On: 02/06/2016 07:40   No results for input(s): WBC, HGB, HCT, PLT in the last 72 hours. No results for input(s): NA, K, CL, GLUCOSE, BUN, CREATININE, CALCIUM in the last 72 hours.  Invalid input(s): CO CBG (last 3)  No results for input(s): GLUCAP in the last 72 hours.  Wt Readings from Last 3 Encounters:  02/04/16 53.8 kg (118 lb 9.7 oz)  02/03/16 52.2 kg (115 lb)  01/04/16 56.6 kg (124 lb 12.5 oz)    Physical Exam:  Constitutional: She appears well-developed. She has a sickly appearance.  Thin female with cuffed trach in place. No distress HENT:  Head: Normocephalicand atraumatic.  Continued debris on tongue, mucosa more moist Eyes: Conjunctivaeare normal. Pupils are equal, round, and reactive to light.  Neck:  #6 cuffed  trach. Site clean Cardiovascular: tachy  Respiratory: improved air movement. Decreased sounds at bases still. Scattered rhonchi GI: Soft. Bowel sounds are normal. She exhibits no distension. There is no tenderness.  PEG site clean and dry. Musculoskeletal: She exhibits no edema.  Neurological: She is alert.  Nods head y/n to questions. Tries to mouth words.  Significant flexor tone LUE at biceps and brachioradialis 2-3/4. LLE : hamstring hip adductors 3/4. Motor 2-3/5 RUE and RLE with inconsistent effort. LUE and LLE potentially 1-2/5 but blocked by tone. (no change) Skin: Skin is warmand dry. Macular rash on buttocks/sacrum  Psychiatric: alert [place] but attempts to engage  Assessment/Plan: 1. Functional, cognitive, and mobility deficits secondary to encephalopathy/debility which require 3+ hours per day of interdisciplinary therapy in a comprehensive inpatient rehab setting. Physiatrist is providing close team supervision and 24 hour management of active medical problems listed below. Physiatrist and rehab team continue to assess barriers to discharge/monitor patient progress toward functional and medical goals.  Function:  Bathing Bathing position   Position:  (Night bath; total A +2 supine)  Bathing parts   Body parts bathed by helper: Right arm, Right upper leg, Left arm, Left upper leg, Chest, Abdomen, Right lower leg, Front perineal area, Left lower leg, Buttocks, Back  Bathing assist Assist Level: 2 helpers      Upper Body Dressing/Undressing Upper body dressing   What is the patient wearing?: Bra, Pull over shirt/dress   Bra - Perfomed by helper: Thread/unthread right bra strap, Thread/unthread left bra strap, Hook/unhook bra (pull down sports bra)   Pull over shirt/dress - Perfomed by helper: Thread/unthread right sleeve,  Thread/unthread left sleeve, Put head through opening, Pull shirt over trunk        Upper body assist Assist Level: 2 helpers      Lower Body  Dressing/Undressing Lower body dressing   What is the patient wearing?: Pants, Socks, Shoes       Pants- Performed by helper: Thread/unthread right pants leg, Thread/unthread left pants leg, Pull pants up/down, Fasten/unfasten pants       Socks - Performed by helper: Don/doff right sock, Don/doff left sock   Shoes - Performed by helper: Don/doff right shoe, Don/doff left shoe, Fasten right, Fasten left          Lower body assist Assist for lower body dressing: 2 Medical sales representative     Toileting steps completed by helper: Adjust clothing prior to toileting, Performs perineal hygiene, Adjust clothing after toileting    Toileting assist Assist level: Two helpers   Transfers Chair/bed transfer   Chair/bed transfer method: Lateral scoot Chair/bed transfer assist level: 2 helpers Chair/bed transfer assistive device: Sliding board, Armrests     Locomotion Ambulation Ambulation activity did not occur: Safety/medical concerns         Wheelchair       Assist Level: Dependent (Pt equals 0%)  Cognition Comprehension Comprehension assist level: Understands basic 50 - 74% of the time/ requires cueing 25 - 49% of the time  Expression Expression assist level: Expresses basic 25 - 49% of the time/requires cueing 50 - 75% of the time. Uses single words/gestures.  Social Interaction Social Interaction assist level: Interacts appropriately 50 - 74% of the time - May be physically or verbally inappropriate.  Problem Solving Problem solving assist level: Solves basic less than 25% of the time - needs direction nearly all the time or does not effectively solve problems and may need a restraint for safety  Memory Memory assist level: Recognizes or recalls 25 - 49% of the time/requires cueing 50 - 75% of the time   Medical Problem List and Plan: 1. Functional, mobility and cognitive/communication deficitssecondary to debility in setting of prior  encephalopathy/tetraplegia  -continue PT,OT, SLP 2. DVT Prophylaxis/Anticoagulation: Pharmaceutical: Lovenox 3. Pain Management: tylenol prn 4. Mood: LCSW to follow for evaluation when appropriate 5. Neuropsych: This patient is notcapable of making decisions on herown behalf. 6. Skin/Wound Care: routine pressure relief measures, Prevalon boots 7. Fluids/Electrolytes/Nutrition: Continue Tube feeds 125 cc from 4 pm to 8 am. Water flushes 300 cc/ 5 X day. Will consult Dietician for input.  -continue to monitor   8. Spasticity: On baclofen qid and valium qid.  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  -ROM with therapy -will perform botox today 9. Candida UTI/C diff colitis: has completed treatment.   -continue contact precautions 10 VDRF: Continue cuffed #6 trach. added nebulizers prn to help mobilize secretions. Add mucinex to loosen secretions. PMSV with supervision.   -cxr with persistent right lower lobe atelectasis vs pneumonia--really no change in appearance  -appreciate pulmonary consult/recs--no change in plan at this point  -continue nebs, chest pt, OOB, suction,   -sputum cx with few GPC and GNR        LOS (Days) 3 A FACE TO FACE EVALUATION WAS PERFORMED  Meredith Staggers, MD 02/06/2016 9:37 AM

## 2016-02-06 NOTE — Progress Notes (Signed)
Patient requested to be deep suctioned.  Attempted deep suctioning patient but unable to pass catheter as usual.  Changed inner cannula, no obvious clogs noted but patient was able to cough up thick green, tan mucous plug.  Deep suctioned again, with moderate results and was able to pass suction catheter without problem.  Brita Romp, RN

## 2016-02-06 NOTE — Progress Notes (Signed)
Name: Judy Spears MRN: [id number] DOB: April 29, 1950    ADMISSION DATE:  02/03/2016 CONSULTATION DATE:  02/03/16  REFERRING MD :  [name]  CHIEF COMPLAINT:  VDRF  Brief  Judy Spears is a 66 y.o. 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  She initially had rehabilitation at [facility] in [place] and then returned to [place].  She had trach removed in May 2017 but unfortunately had complications of laryngospasm and had to have trach re-done 2 weeks later followed by tracheal stenosis partial resection and #4 cuffed tracheostomy placed by ENT in [facility] in June 2017.  Not long after that she was transferred back to her home city of [place] where she participated in ongoing rehabilitation. Pulmonary medicine was consulted for further evaluation. By September 2017 she had been maintained with her tracheostomy and a cuff-less state, capped 24 hours a day for several months. After an initial period of observation in [place] we electively decannulate her in October 2017. She did well for 6 weeks from a respiratory standpoint until she came down with a viral illness in late November 2017. Initially she tolerated this well but unfortunately not long afterwards she developed pneumonia with Pseudomonas and staph requiring recannulation with a tracheostomy and mechanical ventilation. She was treated with cefepime and electroencephalographic studies at our center were worrisome for status epilepticus. She was transferred to [facility] for further management. There was felt that the electrical activity was not consistent with epilepsy and antiepileptic medicines were eventually held. There she was treated for Klebsiella and Pseudomonas pneumonia with vancomycin, Zosyn, and meropenem. Most recent course of  antibiotics ended on 01/25/2016. She was also treated with C. difficile treatment (oral Flagyl and vancomycin) through 01/31/2016. She was also found to have pyelonephritis believed to be due to Candida and was treated with fluconazole through 02/01/2016.  She was treated with mechanical ventilation through 01/26/2016 and has been transitioned to a collar device since then. She has used a speaking valve some since then. She has had persistent mucus production and is able to cough it out around her #6 cuffed trach with the cuff deflated.  She still has thick brown secretions.  A CT scan performed on 01/25/2016 showed a left lower lobe pneumonia and likely pyelonephritis and evidence of diarrhea. There was also nonspecific pancreatic duct enlargement.  Since initial diagnosis, she has had complicated course; most recently over the past couple of months.     Events 1/11 -   No acute events.  Tolerated chest PT via vibra vest yesterday well. CXR today with slightly worse today but favor chronic changes with possible scarring versus acute infection.   SUBJECTIVE/OVERNIGHT/INTERVAL HX 1/12 - caregiver wonders when she can go to cuffless trach (currently 6 shiley cuiffed since 12/28/15). Not needing vent. Reports of on and off secretions but are improving. No fever. Coughs secretions. Ongoing Chest PT   VITAL SIGNS: Temp:  [98 F (36.7 C)-98.2 F (36.8 C)] 98 F (36.7 C) (01/12 0533) Pulse Rate:  [80-97] 85 (01/12 1235) Resp:  [16-19] 16 (01/12 1235) BP: (130-134)/(78-83) 130/78 (01/12 0533) SpO2:  [97 %-100 %] 100 % (01/12 1235) FiO2 (%):  [28 %-35 %] 35 % (01/12 1235)  PHYSICAL EXAMINATION: General: Chronically ill appearing female, sleeping Neuro: sleeping (was awake earlier) HEENT: [place]/AT. PERRL, sclerae anicteric.  Lurline Idol  C/D/I, #6 cuffed. Cardiovascular: RRR, no M/R/G.  Lungs: Respirations even and unlabored.  Coarse breath shounds  Bilaterally though improved. Abdomen: PEG in place.   BS x 4, soft, NT/ND.  Musculoskeletal: No gross deformities, no edema.  Skin: Intact, warm, no rashes.    No results for input(s): NA, K, CL, CO2, BUN, CREATININE, GLUCOSE in the last 168 hours. No results for input(s): HGB, HCT, WBC, PLT in the last 168 hours. Dg Chest Port 1 View  Result Date: 02/06/2016 CLINICAL DATA:  Hypoxia. EXAM: PORTABLE CHEST 1 VIEW COMPARISON:  02/04/2016 .  01/03/2016.  01/01/2016. FINDINGS: Tracheostomy tube noted stable position. Heart size stable. Persistent dense subsegmental atelectasis in the right base. Associated pneumonia cannot be excluded. Mild left base subsegmental atelectasis. Small left pleural effusion .  No pneumothorax . IMPRESSION: 1. Tracheostomy tube in stable position. 2. Persistent prominent right base subsegmental atelectasis. Infiltrate cannot be excluded. 3. Mild left base subsegmental atelectasis and tiny left pleural effusion . Electronically Signed   By: Marcello Moores  Register   On: 02/06/2016 07:40    STUDIES:  01/25/2016 CT chest/ab/pelvis [facility]: 1) likely peylo left kidney, 2) diarrhea in colon, 3) Ground glass in RML and RLL likely pneumonia vs aspiration, near complete left lower lobe consolidation with distal airways plugging, 4) compression fractures T1 and L2-L4, 5) 13 mm soft tissue nodule left upper breast 6) mildly dilated pancreatic duct indeterminate etiology with pancreatic atropy   Results for orders placed or performed during the hospital encounter of 02/03/16  MRSA PCR Screening     Status: None   Collection Time: 02/04/16 10:04 AM  Result Value Ref Range Status   MRSA by PCR NEGATIVE NEGATIVE Final    Comment:        The GeneXpert MRSA Assay (FDA approved for NASAL specimens only), is one component of a comprehensive MRSA colonization surveillance program. It is not intended to diagnose MRSA infection nor to guide or monitor treatment for MRSA infections.   Culture, respiratory (NON-Expectorated)     Status:  None (Preliminary result)   Collection Time: 02/06/16  3:53 AM  Result Value Ref Range Status   Specimen Description TRACHEAL ASPIRATE  Final   Special Requests Normal  Final   Gram Stain   Final    MODERATE WBC PRESENT,BOTH PMN AND MONONUCLEAR FEW GRAM POSITIVE RODS FEW GRAM NEGATIVE RODS    Culture PENDING  Incomplete   Report Status PENDING  Incomplete    SIGNIFICANT EVENTS    ASSESSMENT / PLAN:  66 year old female with a past medical history significant for Powassen virus encephalitis in November 2016 leading to significant neurologic injury and prolonged rehab course comes back to [facility] rehabilitation after a recent hospitalization for healthcare associated pneumonia leading to respiratory failure, C. difficile, and a neurologic evaluation for possible epilepsy. Fortunately there is no evidence of status epilepticus. Pulmonary and critical care medicine is consulted for assistance with management of pulmonary secretions and tracheostomy.  Healthcare associated pneumonia: Recently treated with IV antibiotics. CXRs 's likely going to be abnormal considering her multiple recent episodes of pneumonia.   Course 1/12 - stable and RLL atlectasis stable v slighty improved with pulm toilet. No evidence of fever or developing sepsis   PLAN - Hold off on antibiotics unless respiratory status changes or chest x-ray findings conitnue to worsen (favor chronic changes / scarring versus true infection). - follow clinically and get cxr prn  - add 36h mucomyst on 02/06/16  - continue 3% saline  nebs daily for 72h from 02/06/16. - Agree with speech therapy evaluation, eventually will need to repeat swallowing evaluation. - await respiratory culture. - albuterol prn. - continue chest pt bid, can drop down to qdr 01/13 dependong on situation  Tracheostomy status: - Continue tracheostomy care per routine. - If her respiratory status remains stable we will change trach  To 6 cuffleess on  02/09/16     Dr. Brand Males, M.D., [facility].C.P Pulmonary and Critical Care Medicine Staff Physician [facility] Pager: [phone], If no answer or between  15:00h - 7:00h: call [phone]  02/06/2016 2:10 PM

## 2016-02-06 NOTE — Progress Notes (Signed)
Attempted to titrate fi02 to .28 pt sats 88-89% increased back to .35 at 10lpm. sats 98%.

## 2016-02-06 NOTE — Progress Notes (Signed)
Botox Injection for spasticity using needle EMG guidance Indication:  Spastic tetraplegia. Today injecting LUE and LLE  Dilution: 100 Units/ml preservative free normal saline     Total Units Injected: 400 Indication: Severe spasticity which interferes with ADL,mobility and/or  hygiene and is unresponsive to medication management and other conservative care  Needle: 1/5" 25g needle.   Number of units per muscle Pectoralis Major 0 units Pectoralis Minor 0 units Biceps 75 units Brachioradialis 50 units Semimembranosus/tendinosis 175 units Biceps femoris 100 units All injections were done after obtaining appropriate  after negative drawback for blood. The patient tolerated the procedure well. Injections sites were dressed/cleaned as needed.

## 2016-02-06 NOTE — Progress Notes (Signed)
Physical Therapy Session Note  Patient Details  Name: Judy Spears MRN: [id number] Date of Birth: 07-May-1950  Today's Date: 02/06/2016 PT Individual Time: [id number] and 1430-1600 PT Individual Time Calculation (min): 78 min and 90 min (total 168 min)    Short Term Goals: Week 1:  PT Short Term Goal 1 (Week 1): Pt will demonstrate rolling R/L totalA +1 PT Short Term Goal 2 (Week 1): Pt will demonstrate supine<>sit with totalA +1 PT Short Term Goal 3 (Week 1): Pt will demonstrate static sitting balance x5 min with stable vitals and BUE support PT Short Term Goal 4 (Week 1): Pt will demonstrate transfer w/c <>bed with totalA +1 PT Short Term Goal 5 (Week 1): Pt will demonstrate standing tolerance with lift equipment x5 min with stable vitals  Skilled Therapeutic Interventions/Progress Updates:   Tx 1: Pt treated by primary therapy and PT clinical specialist for entire session. 6L O2 via trach collar throughout session. Pt received seated in w/c with handoff from OT after previous session, denies pain and agreeable to treatment. Transfer w/c <>tilt table with maximove and +2A. Standing tolerance at 50 degrees x15 min and x10 mins with stable vitals. Vitals in supine at beginning of session: BP 122/69 HR 87 bpm, O2 99%. Vitals at 50 degrees: 121/88 HR 101 O2 100%. While in standing, LLE limited to 135 degrees extension, initially flexed >90 degrees but improved to 135 degrees of extension with weight bearing and reduced tone. Performed quad/glute sets to attempt straightening legs; requires over pressure to progress ROM and improved movement RLE vs LLE. Reaching for horseshoes with BUEs, and ball hits BUE while in standing for functional UE use and coordination, visual tracking, head turns. Returned to w/c at end of session maximove as above; remained seated in w/c at end of session, all needs in reach.   Tx 2: Pt received seated in w/c, denies pain and agreeable to treatment. Pt maintained on 3.5-4 L  O2 via trach collar throughout session, vitals >98% during all tasks. Transfer w/c <>padded tub bench with Stedy and maxA +2, pillow placed in front of knees for comfort, second person assisted with clothing management. Pt sat several minutes on BSC reporting that she felt the urge to urinate but was unable to void; caregiver attempted manual assisted techniques but ultiamtely unsuccessful. StandbyA for sitting balance on commode, BUE support on stedy. Transfer w/c <>mat table with maxA +2 squat pivot for time/energy conservation. Sitting balance with mirror visual feedback, NDT techniques for weight shift R and R trunk shortening/L trunk lengthening. Wedge under R hip to shorten/activate R obliques with improving activation noted with repetition. R side sitting on elbow for further L trunk lengthening. Pt able to achieve and maintain neutral sitting posture approximately 10 seconds by completion of session, with gradual drift to L with fatigue. Discussed with OT, pt and caregiver recommendation that pt have more practice with stedy before advising nursing to use for toilet transfers; all agreeable and plan to continue working over the weekend into next week. Pt remained seated in w/c at end of session, all needs in reach and caregiver present at end of session.   Therapy Documentation Precautions:  Precautions Precautions: Fall Precaution Comments: Trach, PEG, supplemental O2, PMSV Required Braces or Orthoses: Other Brace/Splint Other Brace/Splint: Pt has B AFOs and UE splints Restrictions Weight Bearing Restrictions: No Vital Signs: Oxygen Therapy SpO2: 100 % O2 Device: Tracheostomy Collar (set up changed ) O2 Flow Rate (L/min): 6 L/min  FiO2 (%): 35 %  Pain: Pain Assessment Pain Assessment: (P) Faces Pain Score: 0-No pain   See Function Navigator for Current Functional Status.   Therapy/Group: Individual Therapy  Luberta Mutter 02/06/2016, 11:12 AM

## 2016-02-06 NOTE — Progress Notes (Signed)
Nutrition Follow-up  DOCUMENTATION CODES:   Not applicable  INTERVENTION:  Continue home TF regimen: Nocturnal tube feeds of Osmolite 1.5 formula via PEG at goal rate of 125 ml/hr x 16 hours (4pm-8am) to provide 3000 kcal, 125 grams of protein, and 1520 ml of free water.   Continue free water flushes of 300 ml given 5 times daily per tube.  RD to continue to monitor.   NUTRITION DIAGNOSIS:   Increased nutrient needs related to chronic illness as evidenced by estimated needs; ongoing  GOAL:   Patient will meet greater than or equal to 90% of their needs; met  MONITOR:   TF tolerance, Skin, I & O's, Labs, Weight trends  REASON FOR ASSESSMENT:   Consult Enteral/tube feeding initiation and management  ASSESSMENT:   66 year old female with history of encephalitis due to Powassen virus 59/2763 complicated by VDRF, R-VC paralysis, tracheal stenosis s/p repair, spasticity, restlessness,  recent admission 12/28/2015 for acute respiratory failure due to Pseudomonas and MSSA multilobar HCAP. Hospital course significant for vent dependence with need for recanalization of trach and decline in MS due to refractory seizures despite multiple AEDs. She was transferred to [facility] on 12/10 for management and monitoring. 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 She was weaned to [facility] on 01/1 and tolerating PMSV.  Pt has been tolerating her tube feeds with no other difficulties. Per RN, pt did vomit yesterday during deep suctioning, however vomit was not the tube feeding. RD to continue with current orders. Pt was working with OT during time of visit, thus physical exam unable to be completed at this time. Husband at bedside curious about pt weight trends since rehab admission. No recent weight recorded. Noted TF protocol  (which has daily weights) has not been ordered. RD to order.  Will continue to monitor.   Diet Order:  Diet NPO time specified  Skin:  Reviewed, no issues  Last BM:  1/11  Height:   Ht Readings from Last 1 Encounters:  02/03/16 _0  (1.626 m)    Weight:   Wt Readings from Last 1 Encounters:  02/04/16 118 lb 9.7 oz (53.8 kg)    Ideal Body Weight:  54.5 kg  BMI:  Body mass index is 20.36 kg/m.  Estimated Nutritional Needs:   Kcal:  1900-2100 minimum  Protein:  95-115 grams minimum  Fluid:  1.9 - 2.1 L/day minimum  EDUCATION NEEDS:   No education needs identified at this time  Corrin Parker, MS, RD, LDN Pager # [phone] After hours/ weekend pager # [phone]

## 2016-02-06 NOTE — Progress Notes (Signed)
Speech Language Pathology Daily Session Note  Patient Details  Name: Judy Spears MRN: [id number] Date of Birth: 03-19-1950  Today's Date: 02/06/2016 SLP Individual Time: 1330-1430 SLP Individual Time Calculation (min): 60 min   Short Term Goals: Week 1: SLP Short Term Goal 1 (Week 1): Patient will demonstrate intelligibility at the word level 25% of the time with Max A multimodal cues for use of speech intelligibility strategies.  SLP Short Term Goal 2 (Week 1): Patient will perform diaphragmatic breathing exercises with Max A multimodal cues at the vowel and word level.  SLP Short Term Goal 3 (Week 1): Patient will perform phrase completion tasks with 50% accuracy and Max A multimodal cues.  SLP Short Term Goal 4 (Week 1): Patient will utilize multimodal communication to express wants/needs with Max A multimodal cues.  SLP Short Term Goal 5 (Week 1): Patient will tolerate PMSV for 60 minutes with all vitals remaining WFL.   Skilled Therapeutic Interventions: Skilled treatment session focused on speech goals. Upon arrival, patient was asleep in bed but easily awakened. Patient requested to get into her wheelchair and was transferred with +2 total A. Upon initial donning of PMSV, but demonstrated air trapping after ~30 seconds. However, after donning PMSV for the second time (while SLP was distracting patient), patient tolerated PMSV for ~45 minutes with all vitals remaining WFL. Patient required Max A verbal and visual cues for utilization of single words to maximize intelligibility but was ~75% intelligible at the word level when answering questions in regards to biographical information with multiple repetitions and Mod A verbal cues for use of "big and loud."  Patient also required Max A multimodal cues for utilization of diaphragmatic breathing/techniques. PMSV removed at end of session. Patient left upright in wheelchair with caregiver present. Continue with current plan of care.    Function:  Cognition Comprehension Comprehension assist level: Understands basic 50 - 74% of the time/ requires cueing 25 - 49% of the time  Expression Expression assistive device: Talk trach valve Expression assist level: Expresses basic 25 - 49% of the time/requires cueing 50 - 75% of the time. Uses single words/gestures.  Social Interaction Social Interaction assist level: Interacts appropriately 50 - 74% of the time - May be physically or verbally inappropriate.  Problem Solving Problem solving assist level: Solves basic less than 25% of the time - needs direction nearly all the time or does not effectively solve problems and may need a restraint for safety  Memory Memory assist level: Recognizes or recalls 25 - 49% of the time/requires cueing 50 - 75% of the time    Pain Pain Assessment Faces Pain Scale: No hurt  Therapy/Group: Individual Therapy  Klarisa Barman, [place] 02/06/2016, 3:15 PM

## 2016-02-06 NOTE — Progress Notes (Signed)
Occupational Therapy Session Note  Patient Details  Name: Judy Spears MRN: [id number] Date of Birth: Oct 03, 1950  Today's Date: 02/06/2016 OT Individual Time: [id number] and 1100-1210 OT Individual Time Calculation (min): 30 min and 70 min    Short Term Goals:Week 1:  OT Short Term Goal 1 (Week 1): Will initiate timed toileting for skin protection OT Short Term Goal 2 (Week 1): Pt will initate forward lean during transfer with mod A and VCs OT Short Term Goal 3 (Week 1): Pt will complete low reach grasping with mod A in pre function task  Skilled Therapeutic Interventions/Progress Updates:    Session One: Pt seen for OT session focusing on ADL re-training and family education. Pt in supine upon arrival with husband and personal care attendant present. LB dressing completed total A in supine, pt able to initiate bringing leg up in prep for threading into pants. She rolled with total A for pants to be pulled up. She transferred to EOB with total A +1. Sliding board transfer completed into tilt-in-space chair. Seated in chair, UB dressing completed, pt reaching to assist with threading shirt on, however, not functional at this time. Pt left seated in w/Spears with hand off to PT. Discussed with caregiver possibility of pt wearing long gown to ease toileting task for clothing management. Both caregiver and pt's husband against this option as they feel pt does better when dressed and needs to be dress appropriately as they usually have visitors and go out into community at home.   Session Two: Pt seen for OT session focusing on functional transfers, activity/ positioning tolerance. Session completed in conjunction with delivery of knee extension dynamic splint with Dennis Bast from [facility] products.  Pt sitting up in w/Spears upon arrival, agreeable to tx session with husband and personal caregiver present and Medical Product Rep present. During session, she completed sliding board transfer with total  A +1 with +2 to steady equipment. She was positioned in reclined position on therapy mat with better positioning with donning/ trialing of new L knee extension brace. Pt required to be repositioned on wedge as HR raising to 120s and O2 in 70s-80s. Once repositioned in more upright position pt able to tolerate well with vitals remaining WFL on 6L supplemental 02. Education/ demonstration of brace completed with pt tolerating well. Pt sat EOM with min A while equipment set-up in prep for transfer, required +2 to place board to assist with leaning for adequate board placement.  Pt returned to w/Spears and then returned to supine in bed in prep for medical procedure. Caregivers present.  Pt requires max- total A for positioning of LEs in prep for transfers due to increased flexor tone in L LE.   Therapy Documentation Precautions:  Precautions Precautions: Fall Precaution Comments: Trach, PEG, supplemental O2, PMSV Required Braces or Orthoses: Other Brace/Splint Other Brace/Splint: Pt has B AFOs and UE splints Restrictions Weight Bearing Restrictions: No  See Function Navigator for Current Functional Status.   Therapy/Group: Individual Therapy  Judy Spears, Judy Spears 02/06/2016, 7:02 AM

## 2016-02-07 ENCOUNTER — Inpatient Hospital Stay (HOSPITAL_COMMUNITY): Payer: BC Managed Care – PPO | Admitting: Occupational Therapy

## 2016-02-07 ENCOUNTER — Inpatient Hospital Stay (HOSPITAL_COMMUNITY): Payer: BC Managed Care – PPO | Admitting: *Deleted

## 2016-02-07 ENCOUNTER — Inpatient Hospital Stay (HOSPITAL_COMMUNITY): Payer: BC Managed Care – PPO | Admitting: Speech Pathology

## 2016-02-07 DIAGNOSIS — J9601 Acute respiratory failure with hypoxia: Secondary | ICD-10-CM

## 2016-02-07 DIAGNOSIS — D62 Acute posthemorrhagic anemia: Secondary | ICD-10-CM

## 2016-02-07 MED ORDER — OSMOLITE 1.5 CAL PO LIQD
1000.0000 mL | ORAL | Status: AC
  Administered 2016-02-07: 1000 mL
  Filled 2016-02-07: qty 1000

## 2016-02-07 MED ORDER — OSMOLITE 1.5 CAL PO LIQD
2000.0000 mL | ORAL | Status: DC
  Administered 2016-02-07 – 2016-02-12 (×7): 2000 mL
  Administered 2016-02-12: 1000 mL
  Filled 2016-02-07 (×13): qty 2000

## 2016-02-07 NOTE — Progress Notes (Signed)
Occupational Therapy Session Note  Patient Details  Name: Judy Spears MRN: [id number] Date of Birth: 05-16-1950  Today's Date: 02/07/2016 OT Individual Time: [id number] OT Individual Time Calculation (min): 87 min     Short Term Goals: Week 1:  OT Short Term Goal 1 (Week 1): Will initiate timed toileting for skin protection OT Short Term Goal 2 (Week 1): Pt will initate forward lean during transfer with mod A and VCs OT Short Term Goal 3 (Week 1): Pt will complete low reach grasping with mod A in pre function task  Skilled Therapeutic Interventions/Progress Updates:    Treatment session with focus on ADL retraining and sitting balance.  Completed bathing and LB dressing at bed level with +2 assistance for rolling.  Encouraged pt to participate in bathing by assisting with lifting BUE, LLE, and straightening RLE to assist therapist with bathing.  Engaged in bridging for increased participation in Combine with pt requiring assistance to maintain positioning of BLE in bed to allow lift off of buttocks.  Pt's husband assisted with pulling pants over Lt hip during bridging, still requiring rolling to Lt to pull pants over Rt hip.  Completed LB dressing seated at EOB with balance fluctuating from requiring min guard assist to +2 assistance depending on level of challenge.  Focused on sitting balance with appropriate hand placement to facilitate upright sitting, utilized mirroring for increased head and neck positioning to further elicit midline sitting balance.  Transferred to w/c with use of slide board and +2 assistance.  Left upright with husband to complete oral care.  Therapy Documentation Precautions:  Precautions Precautions: Fall Precaution Comments: Trach, PEG, supplemental O2, PMSV Required Braces or Orthoses: Other Brace/Splint Other Brace/Splint: Pt has B AFOs and UE splints Restrictions Weight Bearing Restrictions: No General:   Vital Signs: Therapy Vitals Pulse Rate:  76 Resp: 19 Patient Position (if appropriate): Sitting Oxygen Therapy SpO2: 96 % O2 Device: Tracheostomy Collar O2 Flow Rate (L/min): 10 L/min FiO2 (%): 35 % Pain:  Pt with no c/o pain  See Function Navigator for Current Functional Status.   Therapy/Group: Individual Therapy  Simonne Come 02/07/2016, 12:09 PM

## 2016-02-07 NOTE — Progress Notes (Signed)
Physical Therapy Session Note  Patient Details  Name: Judy Spears MRN: [id number] Date of Birth: 1950-12-20  Today's Date: 02/07/2016 PT Individual Time: [id number] PT Individual Time Calculation (min): 82 min    Short Term Goals: Week 1:  PT Short Term Goal 1 (Week 1): Pt will demonstrate rolling R/L totalA +1 PT Short Term Goal 2 (Week 1): Pt will demonstrate supine<>sit with totalA +1 PT Short Term Goal 3 (Week 1): Pt will demonstrate static sitting balance x5 min with stable vitals and BUE support PT Short Term Goal 4 (Week 1): Pt will demonstrate transfer w/c <>bed with totalA +1 PT Short Term Goal 5 (Week 1): Pt will demonstrate standing tolerance with lift equipment x5 min with stable vitals  Skilled Therapeutic Interventions/Progress Updates:    Tx focused on bed mobility, sliding board transfers, and tilt table for increased bil LE weight bearing. Pt on 10L 02 today via trach collar with PMSV intermittently.  Pt resting in bed upon arrival. Pt performed rolling with total A of 1. Supine>sit with Total A of 1, but pt attempt assist with each task for reaching in Min ROM. Pt sat EOB x3 min with static sitting balance at Max A level. Pt needed total A for dynamic sitting balance for reciprocal scooting and board placement. Sliding board transfer >WC with +2 total A. Pt needed A for managing LEs in contact with the floor as well as all aspects of advancing hips over board and trunk control. Pt with some posterior lean throughout.  Dependent WC propulsion >Day room Transfer w/c <>tilt table with maximove and +2A. Standing tolerance progressed slowly at 15 degree intervals until ~55 degrees over 25 minute period. Vital signs assessed throughout with some instances of elevated HR initially at 136bpm, but returned to <100 as table elevated. 02 sats ranged from 96-86% on 10L, but never stayed<90% for more than 2 minutes. BP WNL throughout. While in standing, RLE was able to extend, but bil LEs  maintained full ER. LLE unable to reach 135 decrees of ext today. Pt was incontinent of bowel and leaking through briefs, so pt pt transferred back to bed for changing.  Bed mobility and changing performed with +2 A for all needs.  Pt left up in bed with LLE brace on and all needs in reach.   Therapy Documentation Precautions:  Precautions Precautions: Fall Precaution Comments: Trach, PEG, supplemental O2, PMSV Required Braces or Orthoses: Other Brace/Splint Other Brace/Splint: Pt has B AFOs and UE splints Restrictions Weight Bearing Restrictions: No General:   Vital Signs: Therapy Vitals Temp: 98.4 F (36.9 C) Temp Source: Axillary Pulse Rate: 81 Resp: 17 BP: (!) 143/89 Patient Position (if appropriate): Lying Oxygen Therapy SpO2: 100 % O2 Device: Tracheostomy Collar O2 Flow Rate (L/min): 10 L/min FiO2 (%): 35 % Pain: Pain Assessment Faces Pain Scale: No hurt   See Function Navigator for Current Functional Status.   Therapy/Group: Individual Therapy  Soua Lenk Soundra Pilon, PT, DPT  02/07/2016, 4:44 PM

## 2016-02-07 NOTE — Progress Notes (Signed)
Vest not done pt up in chair

## 2016-02-07 NOTE — Progress Notes (Signed)
Speech Language Pathology Daily Session Note  Patient Details  Name: Judy Spears MRN: [id number] Date of Birth: [birth year]  Today's Date: 02/07/2016 SLP Individual Time: 1300-1330 SLP Individual Time Calculation (min): 30 min   Short Term Goals: Week 1: SLP Short Term Goal 1 (Week 1): Patient will demonstrate intelligibility at the word level 25% of the time with Max A multimodal cues for use of speech intelligibility strategies.  SLP Short Term Goal 2 (Week 1): Patient will perform diaphragmatic breathing exercises with Max A multimodal cues at the vowel and word level.  SLP Short Term Goal 3 (Week 1): Patient will perform phrase completion tasks with 50% accuracy and Max A multimodal cues.  SLP Short Term Goal 4 (Week 1): Patient will utilize multimodal communication to express wants/needs with Max A multimodal cues.  SLP Short Term Goal 5 (Week 1): Patient will tolerate PMSV for 60 minutes with all vitals remaining WFL.   Skilled Therapeutic Interventions: Skilled treatment session focused on speech intelligibility goals. Upon arrival, patient was supine in bed with visitor present and PMSV in place. Patient wore PMSV throughout the session with all vitals remaining WFL. Patient named functional items and verbalized their function at the phrase level with 100% accuracy and Mod A verbal cues to achieve 75% intelligibility. However, throughout an informal conversation, patient's intelligibility decreased to ~25%. Overall, patient with increased vocal intensity and speech intelligibility today. PMSV was removed at end of session due to patient reporting she wanted to nap. Patient left supine in bed with all needs within reach. Continue with current plan of care.   Function:    Cognition Comprehension Comprehension assist level: Understands basic 50 - 74% of the time/ requires cueing 25 - 49% of the time  Expression Expression assistive device: Talk trach valve Expression assist level:  Expresses basic 25 - 49% of the time/requires cueing 50 - 75% of the time. Uses single words/gestures.  Social Interaction Social Interaction assist level: Interacts appropriately 50 - 74% of the time - May be physically or verbally inappropriate.  Problem Solving Problem solving assist level: Solves basic less than 25% of the time - needs direction nearly all the time or does not effectively solve problems and may need a restraint for safety  Memory Memory assist level: Recognizes or recalls 25 - 49% of the time/requires cueing 50 - 75% of the time    Pain Pain Assessment Faces Pain Scale: No hurt  Therapy/Group: Individual Therapy  Xzayvier Fagin 02/07/2016, 3:33 PM

## 2016-02-07 NOTE — Progress Notes (Signed)
[place] PHYSICAL MEDICINE & REHABILITATION     PROGRESS NOTE    Subjective/Complaints: Pt seen laying in bed.  Per nursing, no issues overnight.   ROS: limited by cognition  Objective: Vital Signs: Blood pressure 125/85, pulse 76, temperature 98.7 F (37.1 C), temperature source Oral, resp. rate 19, height 5\' 4"  (1.626 m), weight 54.3 kg (119 lb 9.6 oz), last menstrual period 12/01/2010, SpO2 96 %. Dg Chest Port 1 View  Result Date: 02/06/2016 CLINICAL DATA:  Hypoxia. EXAM: PORTABLE CHEST 1 VIEW COMPARISON:  02/04/2016 .  01/03/2016.  01/01/2016. FINDINGS: Tracheostomy tube noted stable position. Heart size stable. Persistent dense subsegmental atelectasis in the right base. Associated pneumonia cannot be excluded. Mild left base subsegmental atelectasis. Small left pleural effusion .  No pneumothorax . IMPRESSION: 1. Tracheostomy tube in stable position. 2. Persistent prominent right base subsegmental atelectasis. Infiltrate cannot be excluded. 3. Mild left base subsegmental atelectasis and tiny left pleural effusion . Electronically Signed   By: Marcello Moores  Register   On: 02/06/2016 07:40   No results for input(s): WBC, HGB, HCT, PLT in the last 72 hours. No results for input(s): NA, K, CL, GLUCOSE, BUN, CREATININE, CALCIUM in the last 72 hours.  Invalid input(s): CO CBG (last 3)  No results for input(s): GLUCAP in the last 72 hours.  Wt Readings from Last 3 Encounters:  02/07/16 54.3 kg (119 lb 9.6 oz)  02/03/16 52.2 kg (115 lb)  01/04/16 56.6 kg (124 lb 12.5 oz)    Physical Exam:  Constitutional: She appears well-developed. She has a sickly appearance. NAD. HENT: Normocephalicand atraumatic.  Eyes: No discharge, EOM appear intact Neck: #6 cuffed trach. Site clean Cardiovascular: RRR. No JVD. Respiratory: Decreased sounds at bases still. +trach collar. Scattered rhonchi GI: Bowel sounds are normal. She exhibits no distension.  PEG site clean and dry. Musculoskeletal: She  exhibits no edema, no tenderness.  Neurological: She is alert.  Tries to mouth words.   Significant flexor tone LUE and LLE Motor: 2-/5 RUE and RLE with inconsistent effort.  Skin: Skin is warmand dry. Macular rash on buttocks/sacrum  Psychiatric: alert. Attempts to engage  Assessment/Plan: 1. Functional, cognitive, and mobility deficits secondary to encephalopathy/debility which require 3+ hours per day of interdisciplinary therapy in a comprehensive inpatient rehab setting. Physiatrist is providing close team supervision and 24 hour management of active medical problems listed below. Physiatrist and rehab team continue to assess barriers to discharge/monitor patient progress toward functional and medical goals.  Function:  Bathing Bathing position   Position:  (Night bath; total A +2 supine)  Bathing parts   Body parts bathed by helper: Right arm, Right upper leg, Left arm, Left upper leg, Chest, Abdomen, Right lower leg, Front perineal area, Left lower leg, Buttocks, Back  Bathing assist Assist Level: 2 helpers      Upper Body Dressing/Undressing Upper body dressing   What is the patient wearing?: Bra, Pull over shirt/dress   Bra - Perfomed by helper: Thread/unthread right bra strap, Thread/unthread left bra strap, Hook/unhook bra (pull down sports bra)   Pull over shirt/dress - Perfomed by helper: Thread/unthread right sleeve, Thread/unthread left sleeve, Put head through opening, Pull shirt over trunk        Upper body assist Assist Level: 2 helpers      Lower Body Dressing/Undressing Lower body dressing   What is the patient wearing?: Pants, Socks, Shoes       Pants- Performed by helper: Thread/unthread right pants leg, Thread/unthread left pants leg,  Pull pants up/down, Fasten/unfasten pants       Socks - Performed by helper: Don/doff right sock, Don/doff left sock   Shoes - Performed by helper: Don/doff right shoe, Don/doff left shoe, Fasten right, Fasten  left          Lower body assist Assist for lower body dressing: 2 Helpers      Toileting Toileting     Toileting steps completed by helper: Adjust clothing prior to toileting, Performs perineal hygiene, Adjust clothing after toileting    Toileting assist Assist level: Two helpers   Transfers Chair/bed transfer   Chair/bed transfer method: Squat pivot Chair/bed transfer assist level: 2 helpers Chair/bed transfer assistive device: Sliding board, Armrests     Locomotion Ambulation Ambulation activity did not occur: Safety/medical concerns         Wheelchair       Assist Level: Dependent (Pt equals 0%)  Cognition Comprehension Comprehension assist level: Understands basic 50 - 74% of the time/ requires cueing 25 - 49% of the time  Expression Expression assist level: Expresses basic 25 - 49% of the time/requires cueing 50 - 75% of the time. Uses single words/gestures.  Social Interaction Social Interaction assist level: Interacts appropriately 50 - 74% of the time - May be physically or verbally inappropriate.  Problem Solving Problem solving assist level: Solves basic less than 25% of the time - needs direction nearly all the time or does not effectively solve problems and may need a restraint for safety  Memory Memory assist level: Recognizes or recalls 25 - 49% of the time/requires cueing 50 - 75% of the time   Medical Problem List and Plan: 1. Functional, mobility and cognitive/communication deficitssecondary to debility in setting of prior encephalopathy/tetraplegia  -Chart history reviewed, discussed with health care providers  -continue CIR 2. DVT Prophylaxis/Anticoagulation: Pharmaceutical: Lovenox 3. Pain Management: tylenol prn 4. Mood: LCSW to follow for evaluation when appropriate 5. Neuropsych: This patient is notcapable of making decisions on herown behalf. 6. Skin/Wound Care: routine pressure relief measures, Prevalon boots 7.  Fluids/Electrolytes/Nutrition: Continue Tube feeds 125 cc from 4 pm to 8 am. Water flushes 300 cc/ 5 X day. Consulted Dietician for input.  -continue to monitor 8. Spasticity: On baclofen qid and valium qid.  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  -ROM with therapy -botox injection performed 1/12 9. Candida UTI/C diff colitis: has completed treatment.   -continue contact precautions 10 VDRF: Continue cuffed #6 trach. added nebulizers prn to help mobilize secretions. Add mucinex to loosen secretions. PMSV with supervision.   -cxr reviewed 1/12 with persistent b/l lower lobe atelectasis  -appreciate pulmonary consult/recs--no change in plan at this point  -continue nebs, chest pt, OOB, suction,   -sputum cx with few GPC and GNR 11. ABLA  Hb 11.0 on 12/10  Cont to monitor    LOS (Days) 4 A FACE TO FACE EVALUATION WAS PERFORMED  Shamus Desantis Lorie Phenix, MD 02/07/2016 10:41 AM

## 2016-02-07 NOTE — Progress Notes (Signed)
Name: Judy Spears MRN: [id number] DOB: 1950-08-23    ADMISSION DATE:  02/03/2016 CONSULTATION DATE:  02/03/16  REFERRING MD :  [name]  CHIEF COMPLAINT:  VDRF  Brief  Judy Spears is a 66 y.o. 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  She initially had rehabilitation at [facility] in [place] and then returned to [place].  She had trach removed in May 2017 but unfortunately had complications of laryngospasm and had to have trach re-done 2 weeks later followed by tracheal stenosis partial resection and #4 cuffed tracheostomy placed by ENT in [facility] in June 2017.  Not long after that she was transferred back to her home city of [place] where she participated in ongoing rehabilitation. Pulmonary medicine was consulted for further evaluation. By September 2017 she had been maintained with her tracheostomy and a cuff-less state, capped 24 hours a day for several months. After an initial period of observation in [place] we electively decannulate her in October 2017. She did well for 6 weeks from a respiratory standpoint until she came down with a viral illness in late November 2017. Initially she tolerated this well but unfortunately not long afterwards she developed pneumonia with Pseudomonas and staph requiring recannulation with a tracheostomy and mechanical ventilation. She was treated with cefepime and electroencephalographic studies at our center were worrisome for status epilepticus. She was transferred to [facility] for further management. There was felt that the electrical activity was not consistent with epilepsy and antiepileptic medicines were eventually held. There she was treated for Klebsiella and Pseudomonas pneumonia with vancomycin, Zosyn, and meropenem. Most recent course of  antibiotics ended on 01/25/2016. She was also treated with C. difficile treatment (oral Flagyl and vancomycin) through 01/31/2016. She was also found to have pyelonephritis believed to be due to Candida and was treated with fluconazole through 02/01/2016.  She was treated with mechanical ventilation through 01/26/2016 and has been transitioned to a collar device since then. She has used a speaking valve some since then. She has had persistent mucus production and is able to cough it out around her #6 cuffed trach with the cuff deflated.  She still has thick brown secretions.  A CT scan performed on 01/25/2016 showed a left lower lobe pneumonia and likely pyelonephritis and evidence of diarrhea. There was also nonspecific pancreatic duct enlargement.  Since initial diagnosis, she has had complicated course; most recently over the past couple of months.     Events 1/11 -   No acute events.  Tolerated chest PT via vibra vest yesterday well. CXR today with slightly worse today but favor chronic changes with possible scarring versus acute infection.   SUBJECTIVE/OVERNIGHT/INTERVAL HX Husband in room reading to patient, Denies new concerns   VITAL SIGNS: Temp:  [97.5 F (36.4 C)-98.7 F (37.1 C)] 98.7 F (37.1 C) (01/13 0656) Pulse Rate:  [76-85] 76 (01/13 1020) Resp:  [16-19] 19 (01/13 1020) BP: (122-125)/(60-85) 125/85 (01/13 0656) SpO2:  [96 %-100 %] 96 % (01/13 1020) FiO2 (%):  [35 %] 35 % (01/13 1020) Weight:  [54.3 kg (119 lb 9.6 oz)] 54.3 kg (119 lb 9.6 oz) (01/13 0656)  PHYSICAL EXAMINATION: General: Chronically ill appearing female, up in chair,  Neuro: alert, smiling. Only gutteral noises w PMV in place HEENT: [place]/AT. PERRL, sclerae anicteric.  Trach C/D/I, #6 cuffed. Cardiovascular:  RRR, no M/R/G.  Lungs: Respirations even and unlabored.  Coarse breath shounds bilaterally Abdomen: PEG in place.  BS x 4, soft, NT/ND.  Musculoskeletal: No gross deformities, no edema. Brace on  leg Skin: Intact, warm, no rashes.    No results for input(s): NA, K, CL, CO2, BUN, CREATININE, GLUCOSE in the last 168 hours. No results for input(s): HGB, HCT, WBC, PLT in the last 168 hours. Dg Chest Port 1 View  Result Date: 02/06/2016 CLINICAL DATA:  Hypoxia. EXAM: PORTABLE CHEST 1 VIEW COMPARISON:  02/04/2016 .  01/03/2016.  01/01/2016. FINDINGS: Tracheostomy tube noted stable position. Heart size stable. Persistent dense subsegmental atelectasis in the right base. Associated pneumonia cannot be excluded. Mild left base subsegmental atelectasis. Small left pleural effusion .  No pneumothorax . IMPRESSION: 1. Tracheostomy tube in stable position. 2. Persistent prominent right base subsegmental atelectasis. Infiltrate cannot be excluded. 3. Mild left base subsegmental atelectasis and tiny left pleural effusion . Electronically Signed   By: Marcello Moores  Register   On: 02/06/2016 07:40  CXR reviewed- R atelectasis/ infilt has slowly evolved commpared with priors   STUDIES:  01/25/2016 CT chest/ab/pelvis [facility]: 1) likely peylo left kidney, 2) diarrhea in colon, 3) Ground glass in RML and RLL likely pneumonia vs aspiration, near complete left lower lobe consolidation with distal airways plugging, 4) compression fractures T1 and L2-L4, 5) 13 mm soft tissue nodule left upper breast 6) mildly dilated pancreatic duct indeterminate etiology with pancreatic atropy   Results for orders placed or performed during the hospital encounter of 02/03/16  MRSA PCR Screening     Status: None   Collection Time: 02/04/16 10:04 AM  Result Value Ref Range Status   MRSA by PCR NEGATIVE NEGATIVE Final    Comment:        The GeneXpert MRSA Assay (FDA approved for NASAL specimens only), is one component of a comprehensive MRSA colonization surveillance program. It is not intended to diagnose MRSA infection nor to guide or monitor treatment for MRSA infections.   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 difficile, and a neurologic evaluation for possible epilepsy. Fortunately there is no evidence of status epilepticus. Pulmonary and critical care medicine is consulted for assistance with management of pulmonary secretions and tracheostomy.  Healthcare associated pneumonia: Recently treated with IV antibiotics. CXRs 's likely going to be abnormal considering her multiple recent episodes of pneumonia. There is scarring/ atx R lung which may include somemucus plugging but looks less like active infiltrate now.  Course 1/13 - stable and RLL atlectasis stable v slighty improved with pulm toilet. No evidence of fever or developing sepsis   PLAN - Hold off on antibiotics unless respiratory status changes or  chest x-ray findings continue to worsen (favor chronic changes / scarring versus true infection). Remains very high risk for aspiration  - follow clinically and get cxr prn  - add 36h mucomyst on 02/06/16  - continue 3% saline nebs daily for 72h from 02/06/16. - Agree with speech therapy evaluation, eventually will need to repeat swallowing evaluation. - await respiratory culture. - albuterol prn. - continue chest pt  bid, can drop down to qd 01/13 depending on situation  Tracheostomy status: - Continue tracheostomy care per routine. - If her respiratory status remains stable we will change trach  To 6 cuffless on 02/09/16    CD Elspeth Blucher, MD PCCM P [phone] After 3:00 PM  [phone]  02/07/2016 10:51 AM

## 2016-02-08 ENCOUNTER — Inpatient Hospital Stay (HOSPITAL_COMMUNITY): Payer: BC Managed Care – PPO | Admitting: Physical Therapy

## 2016-02-08 LAB — CULTURE, RESPIRATORY W GRAM STAIN: Special Requests: NORMAL

## 2016-02-08 LAB — CULTURE, RESPIRATORY

## 2016-02-08 NOTE — Progress Notes (Signed)
Physical Therapy Session Note  Patient Details  Name: Judy Spears MRN: [id number] Date of Birth: 01-Apr-1950  Today's Date: 02/08/2016 PT Individual Time: 0930-1030 PT Individual Time Calculation (min): 60 min    Short Term Goals: Week 1:  PT Short Term Goal 1 (Week 1): Pt will demonstrate rolling R/L totalA +1 PT Short Term Goal 2 (Week 1): Pt will demonstrate supine<>sit with totalA +1 PT Short Term Goal 3 (Week 1): Pt will demonstrate static sitting balance x5 min with stable vitals and BUE support PT Short Term Goal 4 (Week 1): Pt will demonstrate transfer w/c <>bed with totalA +1 PT Short Term Goal 5 (Week 1): Pt will demonstrate standing tolerance with lift equipment x5 min with stable vitals  Skilled Therapeutic Interventions/Progress Updates:    Pt was seen bedside in the am with husband at bedside. Pt rolled R/L with side rails and total A to assist with hygiene and dressing. Pt transferred supine to edge of bed with siderail and total A. Pt tolerated edge of bed about 30 minutes with min to mod assist. Pt was able to sit unsupported on edge of bed for short periods with c/s and verbal cues. While on edge of bed treatment focused on weight shifting, midline position, transitioning from midline to propped on elbow. Pt transferred sit to stand x 2 with total A. Pt unable to fully extend when standing. Pt transferred edge of bed to w/c with squat pivot transfer total A x 1. Pt left sitting up in w/c with husband at bedside and all needs within reach.   Therapy Documentation Precautions:  Precautions Precautions: Fall Precaution Comments: Trach, PEG, supplemental O2, PMSV Required Braces or Orthoses: Other Brace/Splint Other Brace/Splint: Pt has B AFOs and UE splints Restrictions Weight Bearing Restrictions: No General:   Vital Signs: Oxygen Therapy SpO2: 97 % O2 Device: Tracheostomy Collar O2 Flow Rate (L/min): 10 L/min FiO2 (%): 35 % Pain: No c/o pain.   See Function  Navigator for Current Functional Status.   Therapy/Group: Individual Therapy  Dub Amis 02/08/2016, 12:38 PM

## 2016-02-08 NOTE — Progress Notes (Signed)
[facility]ICINE & REHABILITATION     PROGRESS NOTE    Subjective/Complaints: Pt laying in bed this AM.  No reported issues overnight.  ROS: Limited by cognition  Objective: Vital Signs: Blood pressure 140/70, pulse 96, temperature 97.9 F (36.6 C), temperature source Oral, resp. rate 18, height 5\' 4"  (1.626 m), weight 55.1 kg (121 lb 6 oz), last menstrual period 12/01/2010, SpO2 98 %. No results found. No results for input(s): WBC, HGB, HCT, PLT in the last 72 hours. No results for input(s): NA, K, CL, GLUCOSE, BUN, CREATININE, CALCIUM in the last 72 hours.  Invalid input(s): CO CBG (last 3)  No results for input(s): GLUCAP in the last 72 hours.  Wt Readings from Last 3 Encounters:  02/07/16 55.1 kg (121 lb 6 oz)  02/03/16 52.2 kg (115 lb)  01/04/16 56.6 kg (124 lb 12.5 oz)    Physical Exam:  Constitutional: She appears well-developed. She has a sickly appearance. NAD. HENT: Normocephalicand atraumatic.  Eyes: No discharge, EOM appear intact Neck: #6 cuffed trach. Site clean Cardiovascular: RRR. No JVD. Respiratory: Decreased sounds at bases still. +trach collar. Scattered rhonchi, upper airway sounds. GI: Bowel sounds are normal. She exhibits no distension.  PEG site clean and dry. Musculoskeletal: She exhibits no edema, no tenderness.  Neurological: She is alert.  Tries to mouth words at times.   Significant flexor tone LUE and LLE Motor: 2-/5 RUE and RLE with inconsistent effort.  Skin: Skin is warmand dry. Macular rash on buttocks/sacrum  Psychiatric: alert. Attempts to engage  Assessment/Plan: 1. Functional, cognitive, and mobility deficits secondary to encephalopathy/debility which require 3+ hours per day of interdisciplinary therapy in a comprehensive inpatient rehab setting. Physiatrist is providing close team supervision and 24 hour management of active medical problems listed below. Physiatrist and rehab team continue to assess barriers to  discharge/monitor patient progress toward functional and medical goals.  Function:  Bathing Bathing position   Position: Bed  Bathing parts   Body parts bathed by helper: Right arm, Right upper leg, Left arm, Left upper leg, Chest, Abdomen, Right lower leg, Front perineal area, Left lower leg, Buttocks, Back  Bathing assist Assist Level: 2 helpers      Upper Body Dressing/Undressing Upper body dressing   What is the patient wearing?: Bra, Pull over shirt/dress   Bra - Perfomed by helper: Thread/unthread right bra strap, Thread/unthread left bra strap, Hook/unhook bra (pull down sports bra)   Pull over shirt/dress - Perfomed by helper: Thread/unthread right sleeve, Thread/unthread left sleeve, Put head through opening, Pull shirt over trunk        Upper body assist Assist Level: 2 helpers      Lower Body Dressing/Undressing Lower body dressing   What is the patient wearing?: Pants, Socks, Shoes       Pants- Performed by helper: Thread/unthread right pants leg, Thread/unthread left pants leg, Pull pants up/down, Fasten/unfasten pants       Socks - Performed by helper: Don/doff right sock, Don/doff left sock   Shoes - Performed by helper: Don/doff right shoe, Don/doff left shoe, Fasten right, Fasten left          Lower body assist Assist for lower body dressing: 2 Medical sales representative     Toileting steps completed by helper: Adjust clothing prior to toileting, Performs perineal hygiene, Adjust clothing after toileting    Toileting assist Assist level: Two helpers   Transfers Chair/bed transfer   Chair/bed transfer method: Lateral  scoot Chair/bed transfer assist level: 2 helpers Chair/bed transfer assistive device: Sliding board, Armrests     Locomotion Ambulation Ambulation activity did not occur: Safety/medical concerns         Wheelchair       Assist Level: Dependent (Pt equals 0%)  Cognition Comprehension Comprehension assist level:  Understands basic 50 - 74% of the time/ requires cueing 25 - 49% of the time  Expression Expression assist level: Expresses basic 25 - 49% of the time/requires cueing 50 - 75% of the time. Uses single words/gestures.  Social Interaction Social Interaction assist level: Interacts appropriately 50 - 74% of the time - May be physically or verbally inappropriate.  Problem Solving Problem solving assist level: Solves basic less than 25% of the time - needs direction nearly all the time or does not effectively solve problems and may need a restraint for safety  Memory Memory assist level: Recognizes or recalls 25 - 49% of the time/requires cueing 50 - 75% of the time   Medical Problem List and Plan: 1. Functional, mobility and cognitive/communication deficitssecondary to debility in setting of prior encephalopathy/tetraplegia  continue CIR 2. DVT Prophylaxis/Anticoagulation: Pharmaceutical: Lovenox 3. Pain Management: tylenol prn 4. Mood: LCSW to follow for evaluation when appropriate 5. Neuropsych: This patient is notcapable of making decisions on herown behalf. 6. Skin/Wound Care: routine pressure relief measures, Prevalon boots 7. Fluids/Electrolytes/Nutrition: Continue Tube feeds 125 cc from 4 pm to 8 am. Water flushes 300 cc/ 5 X day. Consulted Dietician for input.  -continue to monitor 8. Spasticity: On baclofen qid and valium qid.  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  -ROM with therapy -botox injection performed 1/12 9. Candida UTI/C diff colitis: has completed treatment.   -continue contact precautions 10 VDRF: Continue cuffed #6 trach. added nebulizers prn to help mobilize secretions. Added mucinex to loosen secretions. PMSV with supervision.   -cxr reviewed 1/12 with persistent b/l lower lobe atelectasis  -appreciate pulmonary  consult/recs  -continue nebs, chest pt, OOB, suction,   -sputum cx with pseudomonas, sensitivities pending, will await further pulm recs  -Possible trach change to cuffless tomorrow per Pulm 11. ABLA  Hb 11.0 on 12/10  Cont to monitor    LOS (Days) 5 A FACE TO FACE EVALUATION WAS PERFORMED  Judy Spears Lorie Phenix, MD 02/08/2016 8:21 AM

## 2016-02-09 ENCOUNTER — Inpatient Hospital Stay (HOSPITAL_COMMUNITY): Payer: BC Managed Care – PPO | Admitting: Occupational Therapy

## 2016-02-09 ENCOUNTER — Inpatient Hospital Stay (HOSPITAL_COMMUNITY): Payer: BC Managed Care – PPO | Admitting: Physical Therapy

## 2016-02-09 ENCOUNTER — Inpatient Hospital Stay (HOSPITAL_COMMUNITY): Payer: BC Managed Care – PPO | Admitting: Speech Pathology

## 2016-02-09 DIAGNOSIS — J9601 Acute respiratory failure with hypoxia: Secondary | ICD-10-CM

## 2016-02-09 DIAGNOSIS — D62 Acute posthemorrhagic anemia: Secondary | ICD-10-CM

## 2016-02-09 LAB — BASIC METABOLIC PANEL
ANION GAP: 12 (ref 5–15)
BUN: 12 mg/dL (ref 6–20)
CALCIUM: 9.6 mg/dL (ref 8.9–10.3)
CHLORIDE: 94 mmol/L — AB (ref 101–111)
CO2: 30 mmol/L (ref 22–32)
Creatinine, Ser: 0.38 mg/dL — ABNORMAL LOW (ref 0.44–1.00)
GFR calc non Af Amer: >60 mL/min (ref >60)
GLUCOSE: 125 mg/dL — AB (ref 65–99)
Potassium: 3.2 mmol/L — ABNORMAL LOW (ref 3.5–5.1)
Sodium: 136 mmol/L (ref 135–145)

## 2016-02-09 MED ORDER — POTASSIUM CHLORIDE 20 MEQ/15ML (10%) PO SOLN
20.0000 meq | Freq: Every day | ORAL | Status: DC
  Administered 2016-02-09 – 2016-02-25 (×17): 20 meq
  Filled 2016-02-09 (×19): qty 15

## 2016-02-09 NOTE — Progress Notes (Signed)
Pt husband and caregiver at bedside.  Upon assessment of pt and ascultation of BBS, pt is rhoncus.  Pt husband said he really prefers me not to suction that she had "just been suctioned".  I reviewed charting and did not see where pt had been suctioned since my last visit.

## 2016-02-09 NOTE — Progress Notes (Signed)
Speech Language Pathology Daily Session Note  Patient Details  Name: Judy Spears MRN: [id number] Date of Birth: 10/21/1950  Today's Date: 02/09/2016 SLP Individual Time: 1130-1200 SLP Individual Time Calculation (min): 30 min   Short Term Goals: Week 1: SLP Short Term Goal 1 (Week 1): Patient will demonstrate intelligibility at the word level 25% of the time with Max A multimodal cues for use of speech intelligibility strategies.  SLP Short Term Goal 2 (Week 1): Patient will perform diaphragmatic breathing exercises with Max A multimodal cues at the vowel and word level.  SLP Short Term Goal 3 (Week 1): Patient will perform phrase completion tasks with 50% accuracy and Max A multimodal cues.  SLP Short Term Goal 4 (Week 1): Patient will utilize multimodal communication to express wants/needs with Max A multimodal cues.  SLP Short Term Goal 5 (Week 1): Patient will tolerate PMSV for 60 minutes with all vitals remaining WFL.   Skilled Therapeutic Interventions: Skilled treatment session focused on speech goals. Upon arrival, patient was sitting upright in the wheelchair and appeared lethargic. SLP facilitated session by donning PMSV. Patient tolerated PMSV for ~10 minute intervals X 3. However, patient demonstrated increased coughing and decreased intelligibility at the word and phrase level (~25%). Patient's eyes were closing towards end of session and patient indicated she would like to get back into bed. RN and NT aware. Patient left upright in wheelchair with caregiver present and PMSV removed. Continue with current plan of care.   Function:  Cognition Comprehension Comprehension assist level: Understands basic 50 - 74% of the time/ requires cueing 25 - 49% of the time  Expression   Expression assist level: Expresses basic 25 - 49% of the time/requires cueing 50 - 75% of the time. Uses single words/gestures.  Social Interaction Social Interaction assist level: Interacts appropriately 50  - 74% of the time - May be physically or verbally inappropriate.  Problem Solving Problem solving assist level: Solves basic less than 25% of the time - needs direction nearly all the time or does not effectively solve problems and may need a restraint for safety  Memory Memory assist level: Recognizes or recalls 25 - 49% of the time/requires cueing 50 - 75% of the time    Pain No reports of pain   Therapy/Group: Individual Therapy  Babita Amaker 02/09/2016, 3:23 PM

## 2016-02-09 NOTE — Plan of Care (Signed)
Problem: RH BOWEL ELIMINATION Goal: RH STG MANAGE BOWEL WITH ASSISTANCE STG Manage Bowel with total Assistance.  Outcome: Progressing Pt had one bowel movement on day shift, frequency of loose stools decreasing.  Problem: RH SKIN INTEGRITY Goal: RH STG SKIN FREE OF INFECTION/BREAKDOWN No  New breakdown noted at discharge  Outcome: Progressing Pt will tolerate turning, repositioning well, and will have no skin breakdown, with use of dietary tube feedings for increased nutrition, and skincare products.

## 2016-02-09 NOTE — Progress Notes (Signed)
Called respiratory to assist in suctioning pt, as she appeared to indicate by nodding that she wanted to be suctioned more deeply than I was able to do. Lungs sounded slightly coarse on expiration, rate 22-24/minute. RT arrived (pt's o2 sat 97%) and demonstrated a better technique for suctioning, pt's breathing relaxed. Performed oral care on pt. Pt tolerated well.  Judy Spears

## 2016-02-09 NOTE — Progress Notes (Signed)
Physical Therapy Session Note  Patient Details  Name: Judy Spears MRN: [id number] Date of Birth: 1950/10/12  Today's Date: 02/09/2016 PT Individual Time: 1000-1100 PT Individual Time Calculation (min): 60 min    Short Term Goals: Week 1:  PT Short Term Goal 1 (Week 1): Pt will demonstrate rolling R/L totalA +1 PT Short Term Goal 2 (Week 1): Pt will demonstrate supine<>sit with totalA +1 PT Short Term Goal 3 (Week 1): Pt will demonstrate static sitting balance x5 min with stable vitals and BUE support PT Short Term Goal 4 (Week 1): Pt will demonstrate transfer w/c <>bed with totalA +1 PT Short Term Goal 5 (Week 1): Pt will demonstrate standing tolerance with lift equipment x5 min with stable vitals  Skilled Therapeutic Interventions/Progress Updates:   Pt received seated in w/c with caregiver [name] present; caregiver reports pt with increased L hip pain but unsure if due to botox injections or new knee splint. Lung sounds assessed in sitting; crackles/rales in R lower lobe, decreased breath sounds throughout. Improved cough noted following RT performing chest vest however no secretions produced during session.. Transfer w/c <>tilt table with maximove +2. Upon initial supine lying on table, pt with increased RR and requesting therapist check O2. Vitals WNL, O2 100% and pt required cues and emotional support from Sparkman and therapist to reduce RR. Supported standing at 45 degree x20 min while performing AROM BLE in mini-squats; unable to achieve full knee extension but improved coordination and AROM compared to previous session with this therapist on Friday. Returned to w/c at end of session with maximove as above; remained seated in w/c with all needs in reach and caregiver present.   Therapy Documentation Precautions:  Precautions Precautions: Fall Precaution Comments: Trach, PEG, supplemental O2, PMSV Required Braces or Orthoses: Other Brace/Splint Other Brace/Splint: Pt has B AFOs and UE  splints Restrictions Weight Bearing Restrictions: No   See Function Navigator for Current Functional Status.   Therapy/Group: Individual Therapy  Luberta Mutter 02/09/2016, 3:36 PM

## 2016-02-09 NOTE — Progress Notes (Signed)
[facility] PH[facility] MEDICINE & REHABILITATION     PROGRESS NOTE    Subjective/Complaints: Uneventful night per RN. Awoke easily when I entered.   ROS limited by cognition.   Objective: Vital Signs: Blood pressure 122/62, pulse 80, temperature 98.3 F (36.8 C), temperature source Axillary, resp. rate 20, height 5\' 4"  (1.626 m), weight 52.8 kg (116 lb 4.8 oz), last menstrual period 12/01/2010, SpO2 97 %. No results found. No results for input(s): WBC, HGB, HCT, PLT in the last 72 hours.  Recent Labs  02/09/16 0505  NA 136  K 3.2*  CL 94*  GLUCOSE 125*  BUN 12  CREATININE 0.38*  CALCIUM 9.6   CBG (last 3)  No results for input(s): GLUCAP in the last 72 hours.  Wt Readings from Last 3 Encounters:  02/09/16 52.8 kg (116 lb 4.8 oz)  02/03/16 52.2 kg (115 lb)  01/04/16 56.6 kg (124 lb 12.5 oz)    Physical Exam:  Constitutional: She appears well-developed. She has a sickly appearance. NAD. HENT: Normocephalicand atraumatic.  Eyes: No discharge, EOM appear intact Neck: #6 cuffed trach. Site remains clean Cardiovascular: RRR. Respiratory: reasonable air movement today. Decreased sounds at bases. No rhonchi this morning.[name] GI: Bowel sounds are normal. She exhibits no distension.  PEG site remains clean and dry. Musculoskeletal: She exhibits no edema, no tenderness.  Neurological: She is alert.  Tries to mouth words at times.   Significant flexor tone LUE and LLE. LUE 1-2/4. LLE 3/4, she withdrawals with attempts to stretch Motor: 2-/5 RUE and RLE with inconsistent effort.  Skin: Skin is warmand dry. Macular rash on buttocks/sacrum improving Psychiatric: alert and engages  Assessment/Plan: 1. Functional, cognitive, and mobility deficits secondary to encephalopathy/debility which require 3+ hours per day of interdisciplinary therapy in a comprehensive inpatient rehab setting. Physiatrist is providing close team supervision and 24 hour management of active medical  problems listed below. Physiatrist and rehab team continue to assess barriers to discharge/monitor patient progress toward functional and medical goals.  Function:  Bathing Bathing position   Position: Bed  Bathing parts   Body parts bathed by helper: Right arm, Right upper leg, Left arm, Left upper leg, Chest, Abdomen, Right lower leg, Front perineal area, Left lower leg, Buttocks, Back  Bathing assist Assist Level: 2 helpers      Upper Body Dressing/Undressing Upper body dressing   What is the patient wearing?: Bra, Pull over shirt/dress   Bra - Perfomed by helper: Thread/unthread right bra strap, Thread/unthread left bra strap, Hook/unhook bra (pull down sports bra)   Pull over shirt/dress - Perfomed by helper: Thread/unthread right sleeve, Thread/unthread left sleeve, Put head through opening, Pull shirt over trunk        Upper body assist Assist Level: 2 helpers      Lower Body Dressing/Undressing Lower body dressing   What is the patient wearing?: Pants, Socks, Shoes       Pants- Performed by helper: Thread/unthread right pants leg, Thread/unthread left pants leg, Pull pants up/down, Fasten/unfasten pants       Socks - Performed by helper: Don/doff right sock, Don/doff left sock   Shoes - Performed by helper: Don/doff right shoe, Don/doff left shoe, Fasten right, Fasten left          Lower body assist Assist for lower body dressing: 2 Helpers      Toileting Toileting     Toileting steps completed by helper: Adjust clothing prior to toileting, Performs perineal hygiene, Adjust clothing after toileting  Toileting assist Assist level: Two helpers   Transfers Chair/bed transfer   Chair/bed transfer method: Squat pivot Chair/bed transfer assist level: Total assist (Pt < 25%) Chair/bed transfer assistive device: Sliding board, Armrests     Locomotion Ambulation Ambulation activity did not occur: Safety/medical concerns         Wheelchair        Assist Level: Dependent (Pt equals 0%)  Cognition Comprehension Comprehension assist level: Understands basic 50 - 74% of the time/ requires cueing 25 - 49% of the time  Expression Expression assist level: Expresses basic 25 - 49% of the time/requires cueing 50 - 75% of the time. Uses single words/gestures.  Social Interaction Social Interaction assist level: Interacts appropriately 50 - 74% of the time - May be physically or verbally inappropriate.  Problem Solving Problem solving assist level: Solves basic less than 25% of the time - needs direction nearly all the time or does not effectively solve problems and may need a restraint for safety  Memory Memory assist level: Recognizes or recalls 25 - 49% of the time/requires cueing 50 - 75% of the time   Medical Problem List and Plan: 1. Functional, mobility and cognitive/communication deficitssecondary to debility in setting of prior encephalopathy/tetraplegia  -continue CIR PT, OT,  SLP 2. DVT Prophylaxis/Anticoagulation: Pharmaceutical: Lovenox 3. Pain Management: tylenol prn 4. Mood: LCSW to follow for evaluation when appropriate 5. Neuropsych: This patient is notcapable of making decisions on herown behalf. 6. Skin/Wound Care: routine pressure relief measures, local care for rash 7. Fluids/Electrolytes/Nutrition: Continue Tube feeds 125 cc from 4 pm to 8 am. Water flushes 300 cc/ 5 X day.  -continue to monitor  -replace potassium 8. Spasticity: On baclofen qid and valium qid.  -doing fairly well with valium. Will continue for now, consider klonopin -splinting, dynamic left elbow, knee.  -ROM with therapy -botox injection performed 1/12  9. Candida UTI/C diff colitis: has completed treatment.   -continue contact precautions 10 VDRF: Continue cuffed #6 trach.  nebulizers prn and mucinex to loosen secretions. PMSV with supervision.   -cxr reviewed 1/12 with persistent b/l lower lobe  atelectasis  -appreciate pulmonary consult/recs  -continue nebs, chest pt, OOB, suction,   -sputum cx with pseudomonas-plan for observation unless symptomatic/clinical change  -Possible trach change to cuffless today 11. ABLA  Hb 11.0 on 12/10  Cont to monitor    LOS (Days) 6 A FACE TO FACE EVALUATION WAS PERFORMED  Meredith Staggers, MD 02/09/2016 8:59 AM

## 2016-02-09 NOTE — Progress Notes (Signed)
Pt's residual at start of shift was less than 5 mL aspirated from PEG tube. It is still less than 5 mL at 0616. Pt's feeding tube currently running at 128ml/hr w/300 water flush q6 hours. Pt tolerating well. Pt has had no bm tonight.  Rayann Heman

## 2016-02-09 NOTE — Progress Notes (Signed)
Occupational Therapy Session Note  Patient Details  Name: Judy Spears MRN: [id number] Date of Birth: Jun 13, 1950  Today's Date: 02/09/2016 OT Individual Time: [id number] and 1300-1350 OT Individual Time Calculation (min): 75 min and 50 min    Short Term Goals:Week 1:  OT Short Term Goal 1 (Week 1): Will initiate timed toileting for skin protection OT Short Term Goal 2 (Week 1): Pt will initate forward lean during transfer with mod A and VCs OT Short Term Goal 3 (Week 1): Pt will complete low reach grasping with mod A in pre function task  Skilled Therapeutic Interventions/Progress Updates:    Sessio One: Pt seen for OT session focising on functional sitting balance and core stability/ strengthening during ADL tasks. Pt in supine upon arrival with husband and personal care attendent present. They both assisted therapist with mobility and set-up throughout session.  Pants donned total A in supine, attempting bridging for clothing managemennt. One person to stabilize LEs while 2 others attempted to pull pants up. Pt unable to fully clear bed via bridge and therefore rolled for pants to be pulled up remained of way. She transferred to EOB with total A, pt able to actively move B LEs in direction of EOB, however, required assist to bring to EOB. Seated EOB, worked on pt establishing balance point. Placed pt's hands on knees to promote anterior shift, VCs required for pt to maintain position. Pt with 1 LOB episode posteriorly which she was able to self correct, however, all other LOB episodes required min-mod A and VCs to re-establish anterior weightshift for balance. Pt with uneven pelvic tilt, L hip remaining higher than R, unable to correct via verbal or tactile cuing and overall posterior pelvic tilt. Pt with increased improvement of maintaining LEs on floor during sitting trial, R>L. Attempted to stand from EOB with B knees supported by therapist. Total A to raise from EOB ~2inches. Upon stand,  noticed bed and pants soiled. Therefore, returned to supine and hygiene and new brief/ pants donned total A. Total A sliding board transfer completed into w/c. Reviewed with caregivers optimal way to reposition pt back in chair due to strong posterior pelvic tilt causing pt to slide from back of w/c. Pt left sitting up in chair at end of session, caregiver present and RT entering.   Session Two: Pt seen for OT session focusing on sit <> stand and functional sitting balance/ upright tolerance. Pt in supine upon arrival, agreeable to tx session, personal care attendant present and assisted with mobility. Pt transferred to EOB with total A +1, used chuck pad to assist with postioning hips. +2 required for set-up of STEADY due to pt's decreased sitting balance and poor control of LEs. She required +2 assist to stand from elevated EOB. She was able to hold onto rail of STEADY during transfer, however, total- dependent +2 for transfer into STEADY. Pt unable to come into upright stance due to tone in core and pelvic misalignment. Positioned pt in front of mirror while seated in STEADY, worked on pt obtaining and maintaining unsupported sitting with visual feedback for midline orientation. Manual facilitation provided for chest expansion and erect posture. She was able to maintain B LEs on footplate of STEADY during sitting/ standing trials, weightbearing through bilaterally.  Pt appeared to handle well. On 6L O2 throughout session with no s/s of distress.  Pt returned to w/c at end of session. Left seated in w/c with care attendant present prepping pt to leave room.  Therapy Documentation Precautions:  Precautions Precautions: Fall Precaution Comments: Trach, PEG, supplemental O2, PMSV Required Braces or Orthoses: Other Brace/Splint Other Brace/Splint: Pt has B AFOs and UE splints Restrictions Weight Bearing Restrictions: No (pt contracted nonambulatory)  See Function Navigator for Current Functional  Status.   Therapy/Group: Individual Therapy  [name], Kenwood Rosiak C 02/09/2016, 7:11 AM

## 2016-02-10 ENCOUNTER — Inpatient Hospital Stay (HOSPITAL_COMMUNITY): Payer: BC Managed Care – PPO | Admitting: Physical Therapy

## 2016-02-10 ENCOUNTER — Inpatient Hospital Stay (HOSPITAL_COMMUNITY): Payer: BC Managed Care – PPO | Admitting: Occupational Therapy

## 2016-02-10 ENCOUNTER — Inpatient Hospital Stay (HOSPITAL_COMMUNITY): Payer: BC Managed Care – PPO | Admitting: Speech Pathology

## 2016-02-10 DIAGNOSIS — J9621 Acute and chronic respiratory failure with hypoxia: Secondary | ICD-10-CM

## 2016-02-10 LAB — CBC
HEMATOCRIT: 37.6 % (ref 36.0–46.0)
HEMOGLOBIN: 12 g/dL (ref 12.0–15.0)
MCH: 29.3 pg (ref 26.0–34.0)
MCHC: 31.9 g/dL (ref 30.0–36.0)
MCV: 91.9 fL (ref 78.0–100.0)
Platelets: 288 10*3/uL (ref 150–400)
RBC: 4.09 MIL/uL (ref 3.87–5.11)
RDW: 16.9 % — ABNORMAL HIGH (ref 11.5–15.5)
WBC: 4.7 10*3/uL (ref 4.0–10.5)

## 2016-02-10 MED ORDER — NAPHAZOLINE-GLYCERIN 0.012-0.2 % OP SOLN
1.0000 [drp] | Freq: Three times a day (TID) | OPHTHALMIC | Status: DC
  Administered 2016-02-10 – 2016-02-12 (×8): 2 [drp] via OPHTHALMIC
  Administered 2016-02-12 (×3): 1 [drp] via OPHTHALMIC
  Administered 2016-02-13: 2 [drp] via OPHTHALMIC
  Administered 2016-02-13 (×3): 1 [drp] via OPHTHALMIC
  Administered 2016-02-14 (×2): 2 [drp] via OPHTHALMIC
  Administered 2016-02-14: 1 [drp] via OPHTHALMIC
  Administered 2016-02-14 – 2016-02-15 (×2): 2 [drp] via OPHTHALMIC
  Administered 2016-02-15: 1 [drp] via OPHTHALMIC
  Administered 2016-02-15 (×2): 2 [drp] via OPHTHALMIC
  Administered 2016-02-16 – 2016-02-19 (×11): 1 [drp] via OPHTHALMIC
  Administered 2016-02-19: 2 [drp] via OPHTHALMIC
  Administered 2016-02-19 – 2016-02-20 (×4): 1 [drp] via OPHTHALMIC
  Administered 2016-02-20: 2 [drp] via OPHTHALMIC
  Administered 2016-02-20 – 2016-02-22 (×6): 1 [drp] via OPHTHALMIC
  Administered 2016-02-22: 2 [drp] via OPHTHALMIC
  Administered 2016-02-22 (×2): 1 [drp] via OPHTHALMIC
  Administered 2016-02-23 – 2016-02-24 (×5): 2 [drp] via OPHTHALMIC
  Administered 2016-02-24: 1 [drp] via OPHTHALMIC
  Administered 2016-02-25: 2 [drp] via OPHTHALMIC
  Filled 2016-02-10: qty 15

## 2016-02-10 MED ORDER — AMANTADINE HCL 50 MG/5ML PO SYRP
150.0000 mg | ORAL_SOLUTION | Freq: Two times a day (BID) | ORAL | Status: DC
  Administered 2016-02-11 – 2016-02-25 (×29): 150 mg
  Filled 2016-02-10 (×31): qty 15

## 2016-02-10 NOTE — Progress Notes (Addendum)
[place] PHYSICAL MEDICINE & REHABILITATION     PROGRESS NOTE    Subjective/Complaints: No new issues. Reviewed with RN  ROS: Unable to obtain due to cognitive/mental status issues.   Objective: Vital Signs: Blood pressure (!) 146/74, pulse 76, temperature 98.9 F (37.2 C), temperature source Oral, resp. rate 17, height 5\' 4"  (1.626 m), weight 53.6 kg (118 lb 3.2 oz), last menstrual period 12/01/2010, SpO2 99 %. No results found.  Recent Labs  02/10/16 0540  WBC 4.7  HGB 12.0  HCT 37.6  PLT 288    Recent Labs  02/09/16 0505  NA 136  K 3.2*  CL 94*  GLUCOSE 125*  BUN 12  CREATININE 0.38*  CALCIUM 9.6   CBG (last 3)  No results for input(s): GLUCAP in the last 72 hours.  Wt Readings from Last 3 Encounters:  02/10/16 53.6 kg (118 lb 3.2 oz)  02/03/16 52.2 kg (115 lb)  01/04/16 56.6 kg (124 lb 12.5 oz)    Physical Exam:  Constitutional: She appears well-developed. She has a sickly appearance. NAD. HENT: Normocephalicand atraumatic.  Eyes: No discharge, EOM appear intact Neck: #6 cuffed trach. Site remains clean Cardiovascular: RRR Respiratory: no rhonchi. GI: Bowel sounds are normal. She exhibits no distension.  PEG site remains clean and dry. Musculoskeletal: She exhibits no edema, no tenderness.  Neurological: She is alert.  Tries to mouth words at times.   Significant flexor tone LUE and LLE. LUE 1-2/4. LLE 3/4, she withdrawals with attempts to stretch--was able to stretch left leg almost to full extension today. Was bearing down with ROM LUE Motor: 2-/5 RUE and RLE with inconsistent effort.  Skin: Skin is warmand dry. Macular rash on buttocks/sacrum nearly resolved Psychiatric: alert and engages  Assessment/Plan: 1. Functional, cognitive, and mobility deficits secondary to encephalopathy/debility which require 3+ hours per day of interdisciplinary therapy in a comprehensive inpatient rehab setting. Physiatrist is providing close team supervision  and 24 hour management of active medical problems listed below. Physiatrist and rehab team continue to assess barriers to discharge/monitor patient progress toward functional and medical goals.  Function:  Bathing Bathing position   Position: Bed  Bathing parts   Body parts bathed by helper: Right arm, Right upper leg, Left arm, Left upper leg, Chest, Abdomen, Right lower leg, Front perineal area, Left lower leg, Buttocks, Back  Bathing assist Assist Level: 2 helpers      Upper Body Dressing/Undressing Upper body dressing   What is the patient wearing?: Bra, Pull over shirt/dress   Bra - Perfomed by helper: Thread/unthread right bra strap, Thread/unthread left bra strap, Hook/unhook bra (pull down sports bra)   Pull over shirt/dress - Perfomed by helper: Thread/unthread right sleeve, Thread/unthread left sleeve, Put head through opening, Pull shirt over trunk        Upper body assist Assist Level: 2 helpers      Lower Body Dressing/Undressing Lower body dressing   What is the patient wearing?: Pants, Socks, Shoes       Pants- Performed by helper: Thread/unthread right pants leg, Thread/unthread left pants leg, Pull pants up/down, Fasten/unfasten pants       Socks - Performed by helper: Don/doff right sock, Don/doff left sock   Shoes - Performed by helper: Don/doff right shoe, Don/doff left shoe, Fasten right, Fasten left          Lower body assist Assist for lower body dressing: 2 Medical sales representative     Toileting steps completed  by helper: Adjust clothing prior to toileting, Performs perineal hygiene, Adjust clothing after toileting    Toileting assist Assist level: Two helpers   Transfers Chair/bed transfer   Chair/bed transfer method: Squat pivot Chair/bed transfer assist level: Total assist (Pt < 25%) Chair/bed transfer assistive device: Sliding board, Armrests     Locomotion Ambulation Ambulation activity did not occur: Safety/medical  concerns         Wheelchair       Assist Level: Dependent (Pt equals 0%)  Cognition Comprehension Comprehension assist level: Understands basic 50 - 74% of the time/ requires cueing 25 - 49% of the time  Expression Expression assist level: Expresses basic 25 - 49% of the time/requires cueing 50 - 75% of the time. Uses single words/gestures.  Social Interaction Social Interaction assist level: Interacts appropriately 50 - 74% of the time - May be physically or verbally inappropriate.  Problem Solving Problem solving assist level: Solves basic less than 25% of the time - needs direction nearly all the time or does not effectively solve problems and may need a restraint for safety  Memory Memory assist level: Recognizes or recalls 25 - 49% of the time/requires cueing 50 - 75% of the time   Medical Problem List and Plan: 1. Functional, mobility and cognitive/communication deficitssecondary to debility in setting of prior encephalopathy/tetraplegia  -continue CIR PT, OT,  SLP  -team conf today 2. DVT Prophylaxis/Anticoagulation: Pharmaceutical: Lovenox 3. Pain Management: tylenol prn 4. Mood: LCSW to follow for evaluation when appropriate  -seems to be in fair spirits 5. Neuropsych: This patient is notcapable of making decisions on herown behalf. 6. Skin/Wound Care: routine pressure relief measures, local care for rash 7. Fluids/Electrolytes/Nutrition: Continue Tube feeds 125 cc from 4 pm to 8 am. Water flushes 300 cc/ 5 X day.  -continue to monitor  -replace potassium 8. Spasticity: On baclofen qid and valium qid.  -continue valium for now. consider klonopin re-trial  -consider dantrium trial -splinting, dynamic left elbow, knee.  -ROM with therapy -botox injection performed 1/12  9. Candida UTI/C diff colitis: has completed treatment.   -continue contact precautions 10 VDRF: Continue cuffed #6 trach.  nebulizers prn and mucinex to  loosen secretions. PMSV with supervision.   -cxr reviewed 1/12 with persistent b/l lower lobe atelectasis  -appreciate pulmonary consult/recs  -continue nebs, chest pt, OOB, suction,   -sputum cx with pseudomonas-continue observation unless symptomatic/clinical change  -Possible trach change to cuffless pending pulmonary follow pu 11. ABLA  Hb 11.0 on 12/10  Cont to monitor    LOS (Days) 7 A FACE TO FACE EVALUATION WAS PERFORMED  Meredith Staggers, MD 02/10/2016 8:45 AM

## 2016-02-10 NOTE — Progress Notes (Signed)
Physical Therapy Session Note  Patient Details  Name: Judy Spears MRN: [id number] Date of Birth: [birth year]  Today's Date: 02/10/2016 PT Individual Time: 1300-1400 PT Individual Time Calculation (min): 60 min    Short Term Goals: Week 1:  PT Short Term Goal 1 (Week 1): Pt will demonstrate rolling R/L totalA +1 PT Short Term Goal 2 (Week 1): Pt will demonstrate supine<>sit with totalA +1 PT Short Term Goal 3 (Week 1): Pt will demonstrate static sitting balance x5 min with stable vitals and BUE support PT Short Term Goal 4 (Week 1): Pt will demonstrate transfer w/c <>bed with totalA +1 PT Short Term Goal 5 (Week 1): Pt will demonstrate standing tolerance with lift equipment x5 min with stable vitals  Skilled Therapeutic Interventions/Progress Updates:   Pt received supine in bed, no evidence of pain at rest and agreeable to treatment. Pt maintained on 6L O2 via trach collar throughout session with O2 >98% throughout; minimal bright red secretions cleared through trach during session. Pt's shirt noted to be saturated from leaking PEG tube; RN alerted who replaced cap. Pt pointing towards brief; noted to be wet following urinary accident. Attempted rolling R/L with bedrails; pt initiating rolling with UEs however observed increase in pain and pt tearful. Husband attempted to calm pt down, roll again, however pt resistant and verbalizing discontent. Agreeable to bridging with +3A for doffing/donning pants, brief, replacing chuck pad and performing hygiene. Supine>sit with maxA. Seated balance on EOB with standbyA; +2 to doff/don clean shirt and bra. Sit <>Stand from EOB and Stedy with +2A; improved from total+2>max +1 with repetition of sit >stand from stedy seat. Sitting balance/postural retraining on EOB; tactile/verbal/visual cueing for R oblique contraction to bring trunk to midline. Once reaching midline with NDT facilitation techniques, able to maintain midline positioning with BUE support on  stedy. Returned to supine with maxA +2. Repositioning in bed with +2A. Remained supine in bed with family present at end of session, all needs in reach.   Therapy Documentation Precautions:  Precautions Precautions: Fall Precaution Comments: Trach, PEG, supplemental O2, PMSV Required Braces or Orthoses: Other Brace/Splint Other Brace/Splint: Pt has B AFOs and UE splints Restrictions Weight Bearing Restrictions: No   See Function Navigator for Current Functional Status.   Therapy/Group: Individual Therapy  Luberta Mutter 02/10/2016, 3:09 PM

## 2016-02-10 NOTE — Progress Notes (Signed)
Occupational Therapy Session Note  Patient Details  Name: CURTIS MILIAN MRN: [id number] Date of Birth: 11-22-1950  Today's Date: 02/10/2016 OT Individual Time: [id number] OT Individual Time Calculation (min): 60 min     Short Term Goals:Week 1:  OT Short Term Goal 1 (Week 1): Will initiate timed toileting for skin protection OT Short Term Goal 2 (Week 1): Pt will initate forward lean during transfer with mod A and VCs OT Short Term Goal 3 (Week 1): Pt will complete low reach grasping with mod A in pre function task  Skilled Therapeutic Interventions/Progress Updates:    Pt seen for OT session focusing on ADL re-training, caregiver training, and functional mobility. Pt in supine upon arrival, not appearing to be in distress and smiling when asked about getting into w/c. LB dressing completed in supine total A, pt moving LEs in prep for threading LEs into pants. She rolled with total A to have pants pulled up. With +2 assist, she came into modified long sitting position, able to grasp bed rails in attempt to self support, however, due to such severe posterior pelvic tilt and tone in trunk/ abdomen, pt required total A for sitting balance while second person assisted with clothing. Pt able to initate roll with L UE when rolling to come to EOB and transitioned to EOB with total A. She sat with close supervision ~30 seconds with VCs for anterior weight shift and midline orientation. Total A +1 sliding board transfer completed into w/c with +2 to place and steady board.  Pt attempting to verbalize throughout session, however therapist nor caregiver could understand pt. Pt attempted to write out needs or white board and attempted trial of PMSV, however, neither one functional for communication. With PMSV donned, pt's O2 remained WFL on 5.25L supplemental O2, however, pt with gagging/coughing sounds when donned. PA present and advised not to wear PMSV at this time.  Pt left seated in w/c at end of session  with personal care attendant present prepping to perform oral care for pt.   Therapy Documentation Precautions:  Precautions Precautions: Fall Precaution Comments: Trach, PEG, supplemental O2, PMSV Required Braces or Orthoses: Other Brace/Splint Other Brace/Splint: Pt has B AFOs and UE splints Restrictions Weight Bearing Restrictions: No Pain: Pain Assessment Pain Assessment: Faces Faces Pain Scale: No hurt  See Function Navigator for Current Functional Status.   Therapy/Group: Individual Therapy  [name], Briley Bumgarner C 02/10/2016, 7:08 AM

## 2016-02-10 NOTE — Progress Notes (Signed)
Speech Language Pathology Daily Session Note  Patient Details  Name: Judy Spears MRN: [id number] Date of Birth: 1950/12/13  Today's Date: 02/10/2016 SLP Individual Time: 1120-1200 SLP Individual Time Calculation (min): 40 min   Short Term Goals: Week 1: SLP Short Term Goal 1 (Week 1): Patient will demonstrate intelligibility at the word level 25% of the time with Max A multimodal cues for use of speech intelligibility strategies.  SLP Short Term Goal 2 (Week 1): Patient will perform diaphragmatic breathing exercises with Max A multimodal cues at the vowel and word level.  SLP Short Term Goal 3 (Week 1): Patient will perform phrase completion tasks with 50% accuracy and Max A multimodal cues.  SLP Short Term Goal 4 (Week 1): Patient will utilize multimodal communication to express wants/needs with Max A multimodal cues.  SLP Short Term Goal 5 (Week 1): Patient will tolerate PMSV for 60 minutes with all vitals remaining WFL.   Skilled Therapeutic Interventions: Skilled treatment session focused on functional communication. Upon arrival, patient was upright in the wheelchair. SLP facilitated session by donning the PMSV. Patient immediately began to cough and PMSV was eventually coughed off the trach hub X 2 attempts. SLP initiated use of a clear letter board to maximize patient's functional communication by spelling out words via eye gaze. Patient spelled her name with extra time and Max A verbal cues. Will continue to practice. Patient left upright in wheelchair with RN present. Continue with current plan of care.   Function:  Eating Eating   Modified Consistency Diet: No Eating Assist Level: Helper performs IV, parenteral or tube feed           Cognition Comprehension Comprehension assist level: Understands basic 50 - 74% of the time/ requires cueing 25 - 49% of the time  Expression Expression assistive device: Talk trach valve Expression assist level: Expresses basic 25 - 49% of  the time/requires cueing 50 - 75% of the time. Uses single words/gestures.  Social Interaction Social Interaction assist level: Interacts appropriately 50 - 74% of the time - May be physically or verbally inappropriate.  Problem Solving Problem solving assist level: Solves basic less than 25% of the time - needs direction nearly all the time or does not effectively solve problems and may need a restraint for safety  Memory Memory assist level: Recognizes or recalls 25 - 49% of the time/requires cueing 50 - 75% of the time    Pain No/Denies Pain   Therapy/Group: Individual Therapy  Elasia Furnish 02/10/2016, 2:25 PM

## 2016-02-10 NOTE — Progress Notes (Signed)
Name: Judy Spears MRN: [id number] DOB: 1950/03/26    ADMISSION DATE:  02/03/2016 CONSULTATION DATE:  02/03/16  REFERRING MD :  [name]  CHIEF COMPLAINT:  VDRF  Brief  Judy Spears is a 66 y.o. 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  She initially had rehabilitation at [facility] in [place] and then returned to [place].  She had trach removed in May 2017 but unfortunately had complications of laryngospasm and had to have trach re-done 2 weeks later followed by tracheal stenosis partial resection and #4 cuffed tracheostomy placed by ENT in [facility] in June 2017.  Not long after that she was transferred back to her home city of [place] where she participated in ongoing rehabilitation. Pulmonary medicine was consulted for further evaluation. By September 2017 she had been maintained with her tracheostomy and a cuff-less state, capped 24 hours a day for several months. After an initial period of observation in [place] we electively decannulate her in October 2017. She did well for 6 weeks from a respiratory standpoint until she came down with a viral illness in late November 2017. Initially she tolerated this well but unfortunately not long afterwards she developed pneumonia with Pseudomonas and staph requiring recannulation with a tracheostomy and mechanical ventilation. She was treated with cefepime and electroencephalographic studies at our center were worrisome for status epilepticus. She was transferred to [facility] for further management. There was felt that the electrical activity was not consistent with epilepsy and antiepileptic medicines were eventually held. There she was treated for Klebsiella and Pseudomonas pneumonia with vancomycin, Zosyn, and meropenem. Most recent course of  antibiotics ended on 01/25/2016. She was also treated with C. difficile treatment (oral Flagyl and vancomycin) through 01/31/2016. She was also found to have pyelonephritis believed to be due to Candida and was treated with fluconazole through 02/01/2016.  She was treated with mechanical ventilation through 01/26/2016 and has been transitioned to a collar device since then. She has used a speaking valve some since then. She has had persistent mucus production and is able to cough it out around her #6 cuffed trach with the cuff deflated.  A CT scan performed on 01/25/2016 showed a left lower lobe pneumonia and likely pyelonephritis and evidence of diarrhea. There was also nonspecific pancreatic duct enlargement.  Since initial diagnosis, she has had complicated course; most recently over the past couple of months.     SUBJECTIVE:  SLP reports pt has had intermittent difficulty with PMV use > once valve applied, pt will have increased respirations, audible distress and the valve will pop off.  Other instances, she tolerates the valve well.     VITAL SIGNS: Temp:  [97.8 F (36.6 C)-98.9 F (37.2 C)] 98.9 F (37.2 C) (01/16 0455) Pulse Rate:  [75-87] 76 (01/16 0455) Resp:  [17-22] 18 (01/16 0942) BP: (122-146)/(74-82) 146/74 (01/16 0455) SpO2:  [95 %-100 %] 99 % (01/16 0455) FiO2 (%):  [28 %] 28 % (01/16 0401) Weight:  [118 lb 3.2 oz (53.6 kg)] 118 lb 3.2 oz (53.6 kg) (01/16 0500)  PHYSICAL EXAMINATION:  General: chronically ill appearing female in NAD sitting up in wheelchair Neuro: Awake, interacts with family / staff, arm braces intact CV: s1s2 rrr, no m/r/g PULM: even/non-labored, lungs bilaterally coarse GI: soft, PEG in place Extremities: no acute deformities  Skin: warm/dry,  no edema     Recent Labs Lab 02/09/16 0505  NA 136  K 3.2*  CL 94*  CO2 30  BUN 12  CREATININE 0.38*  GLUCOSE 125*    Recent Labs Lab 02/10/16 0540  HGB 12.0  HCT 37.6  WBC 4.7  PLT 288    No results found.   STUDIES:  01/25/2016 CT chest/ab/pelvis [facility]: 1) likely peylo left kidney, 2) diarrhea in colon, 3) Ground glass in RML and RLL likely pneumonia vs aspiration, near complete left lower lobe consolidation with distal airways plugging, 4) compression fractures T1 and L2-L4, 5) 13 mm soft tissue nodule left upper breast 6) mildly dilated pancreatic duct indeterminate etiology with pancreatic atropy   SIGNIFICANT EVENTS  1/11 - No acute events.  Tolerated chest PT via vibra vest yesterday well. CXR today with slightly worse today but favor chronic changes with possible scarring versus acute infection. 1/13 - stable and RLL atlectasis stable v slighty improved with pulm toilet. No evidence of fever or developing sepsis  1/16 - planned trach change to #6 cuffless   ASSESSMENT / PLAN:  66 year old female with a past medical history significant for Pow[place] virus encephalitis in November 2016 leading to significant neurologic injury and prolonged rehab course comes back to [facility] rehabilitation after a recent hospitalization for healthcare associated pneumonia leading to respiratory failure, C. difficile, and a neurologic evaluation for possible epilepsy. Fortunately there is no evidence of status epilepticus. Pulmonary and critical care medicine is consulted for assistance with management of pulmonary secretions and tracheostomy.  Healthcare associated pneumonia: Recently treated with IV antibiotics. CXRs 's likely going to be abnormal considering her multiple recent episodes of pneumonia. There is scarring/ atx R lung which may include some mucus plugging but looks less like active infiltrate now.  - Monitor off abx unless clinical respiratory change or progressive worsening on CXR - aspiration precautions  - Monitor intermittent CXR  - Pulmonary hygiene > mobilize, upright positioning  - continue SLP efforts for PMV and swallowing - PRN albuterol  - robitussin  DM PRN - continue chest PT QD  Tracheostomy status: - order #6 cuffless to bedside, will change later pm 1/16 when available - discussed downsize to #6 vs #4 > given her complicated course of vent need / no vent need, recommend to keep a #6 to avoid re-dilation in the future if recurrent vent needs.  Would favor keeping trach.    - Continue trach care    Noe Gens, NP-C [facility] Pgr: [phone] or if no answer [phone] 02/10/2016, 11:35 AM

## 2016-02-10 NOTE — Procedures (Signed)
Bedside trach change with RT assistance.  #6 cuffed trach removed over tube exchanger (small suction catheter) without difficulty.   Small amount of bloody oral secretions post trach change.  Trach secured with trach ties.  Positive end-tidal CO2 change.  Good air movement bilaterally post change.    Noe Gens, NP-C [facility] Pgr: [phone] or if no answer [phone] 02/10/2016, 1:07 PM

## 2016-02-10 NOTE — Progress Notes (Signed)
Occupational Therapy Session Note  Patient Details  Name: Judy Spears MRN: [id number] Date of Birth: Aug 29, 1950  Today's Date: 02/10/2016 OT Individual Time: 1000-1045 OT Individual Time Calculation (min): 45 min     Short Term Goals: Week 1:  OT Short Term Goal 1 (Week 1): Will initiate timed toileting for skin protection OT Short Term Goal 2 (Week 1): Pt will initate forward lean during transfer with mod A and VCs OT Short Term Goal 3 (Week 1): Pt will complete low reach grasping with mod A in pre function task  Skilled Therapeutic Interventions/Progress Updates:   1:1 Therapeutic activity. Pt in tilt in space w/c when arrived. Transitioned to the gym. Focus on trunk/ posture at midline with bilateral UE support on grab bar. Pt able to come to neutral with min A with manual facilitation at hips and VC to maintain midline. Pt able to maintain this position and come anteriorly past neutral with cue to bring "nose to the bar." while maintaining upright trunk. Continued focus on functional reach bilaterally and sustained grasp (needed for use of the STEDY). Pt required max A for functional reach to the bar with left UE and mod A for right UE.  Transitioned to bilateral knee adduction with soft ball for feedback; able to perform 10x with VC. Transitioned to activating gluts and quads to come into squat with total A x2 twice to total  A+1 but without full hip or knee extension with bilateral UE grasp. Left to rest in tilt in space w/c in dayroom with caregiver.   Therapy Documentation Precautions:  Precautions Precautions: Fall Precaution Comments: Trach, PEG, supplemental O2, PMSV Required Braces or Orthoses: Other Brace/Splint Other Brace/Splint: Pt has B AFOs and UE splints Restrictions Weight Bearing Restrictions: No Pain:  mild in left UE but able to continue.   See Function Navigator for Current Functional Status.   Therapy/Group: Individual Therapy  Willeen Cass  [facility] 02/10/2016, 1:27 PM

## 2016-02-11 ENCOUNTER — Inpatient Hospital Stay (HOSPITAL_COMMUNITY): Payer: BC Managed Care – PPO | Admitting: Speech Pathology

## 2016-02-11 ENCOUNTER — Inpatient Hospital Stay (HOSPITAL_COMMUNITY): Payer: BC Managed Care – PPO | Admitting: Physical Therapy

## 2016-02-11 ENCOUNTER — Inpatient Hospital Stay (HOSPITAL_COMMUNITY): Payer: BC Managed Care – PPO

## 2016-02-11 ENCOUNTER — Inpatient Hospital Stay (HOSPITAL_COMMUNITY): Payer: BC Managed Care – PPO | Admitting: Occupational Therapy

## 2016-02-11 NOTE — Progress Notes (Signed)
Name: Judy Spears MRN: [id number] DOB: Sep 16, 1950    ADMISSION DATE:  02/03/2016 CONSULTATION DATE:  02/03/16  REFERRING MD :  [name]  CHIEF COMPLAINT:  VDRF  Brief  Judy Spears is a 66 y.o. 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  She initially had rehabilitation at [facility] in [place] and then returned to [place].  She had trach removed in May 2017 but unfortunately had complications of laryngospasm and had to have trach re-done 2 weeks later followed by tracheal stenosis partial resection and #4 cuffed tracheostomy placed by ENT in [facility] in June 2017.  Not long after that she was transferred back to her home city of [place] where she participated in ongoing rehabilitation. Pulmonary medicine was consulted for further evaluation. By September 2017 she had been maintained with her tracheostomy and a cuff-less state, capped 24 hours a day for several months. After an initial period of observation in [place] we electively decannulate her in October 2017. She did well for 6 weeks from a respiratory standpoint until she came down with a viral illness in late November 2017. Initially she tolerated this well but unfortunately not long afterwards she developed pneumonia with Pseudomonas and staph requiring recannulation with a tracheostomy and mechanical ventilation. She was treated with cefepime and electroencephalographic studies at our center were worrisome for status epilepticus. She was transferred to [facility] for further management. There was felt that the electrical activity was not consistent with epilepsy and antiepileptic medicines were eventually held. There she was treated for Klebsiella and Pseudomonas pneumonia with vancomycin, Zosyn, and meropenem. Most recent course of  antibiotics ended on 01/25/2016. She was also treated with C. difficile treatment (oral Flagyl and vancomycin) through 01/31/2016. She was also found to have pyelonephritis believed to be due to Candida and was treated with fluconazole through 02/01/2016.  She was treated with mechanical ventilation through 01/26/2016 and has been transitioned to a collar device since then. She has used a speaking valve some since then. She has had persistent mucus production and is able to cough it out around her #6 cuffed trach with the cuff deflated.  A CT scan performed on 01/25/2016 showed a left lower lobe pneumonia and likely pyelonephritis and evidence of diarrhea. There was also nonspecific pancreatic duct enlargement.  Since initial diagnosis, she has had complicated course; most recently over the past couple of months.     SUBJECTIVE:   SLP reports patient tolerating PMV valve much better post trach change 1/17.  No further bleeding or acute events.    VITAL SIGNS: Temp:  [98.5 F (36.9 C)] 98.5 F (36.9 C) (01/17 0600) Pulse Rate:  [78-89] 79 (01/17 0734) Resp:  [16-20] 17 (01/17 0734) BP: (142-149)/(73-85) 149/85 (01/17 0600) SpO2:  [95 %-100 %] 100 % (01/17 0734) FiO2 (%):  [28 %] 28 % (01/17 0734)  PHYSICAL EXAMINATION:  General: adult female in NAD, up in [place] HEENT: #6 cuffless trach c/d/i, no bleeding or exudate at site Neuro: Awake/alert, voice soft with PMV in place but clear CV: s1s2 rrr, no m/r/g PULM: even/non-labored, lungs bilaterally clear GI: soft, non-tender, PEG in place Extremities: warm/dry, braces on BUE   Recent Labs Lab 02/09/16 0505  NA 136  K 3.2*  CL 94*  CO2 30  BUN 12  CREATININE 0.38*  GLUCOSE 125*  Recent Labs Lab 02/10/16 0540  HGB 12.0  HCT 37.6  WBC 4.7  PLT 288   Dg Chest Port 1 View  Result Date: 02/11/2016 CLINICAL DATA:  Atelectasis. EXAM: PORTABLE CHEST 1 VIEW COMPARISON:  02/06/2016. FINDINGS: Tracheostomy tube noted in  stable position. Persistent but slightly improved bibasilar subsegmental atelectasis. Tiny left pleural effusion again noted. No pneumothorax. Heart size stable. No acute bony abnormality . IMPRESSION: 1. Tracheostomy tube in stable position. 2. Persistent but improving bibasilar subsegmental atelectasis. Tiny left pleural effusion again noted. 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  Tolerated chest PT via vibra vest yesterday well. CXR today with slightly worse today but favor chronic changes with possible scarring versus acute infection. 1/13 - stable and RLL atlectasis stable v slighty improved with pulm toilet. No evidence of fever or developing sepsis  1/16 - planned trach change to #6 cuffless   ASSESSMENT / PLAN:  66 year old female with a past medical history significant for Pow[place] virus encephalitis in November 2016 leading to significant neurologic injury and prolonged rehab course comes back to [facility] rehabilitation after a recent hospitalization for healthcare associated pneumonia leading to respiratory failure, C. difficile, and a neurologic evaluation for possible epilepsy. Fortunately there is no evidence of status epilepticus. Pulmonary and critical care medicine is consulted for assistance with management of pulmonary secretions and tracheostomy.  Healthcare associated pneumonia: Recently treated with IV antibiotics. CXRs 's likely going to be abnormal considering her multiple recent episodes of  pneumonia. There is scarring/ atx R lung which may include some mucus plugging but looks less like active infiltrate now.  - continue #6 cuffless trach - monitor off abx unless clinical respiratory change or progressive worsening on CXR  - aspiration precautions  - intermittent CXR - push pulmonary hygiene > mobilize, chest PT, upright positioning - continue SLP efforts with PMV / diet needs - PRN albuterol  - robitussin DM PRN    Tracheostomy status: - continue #6 cuffless, would not downsize given history of recurrent vent needs. Have discussed this with family and they are ok with plan - trach care per protocol   Noe Gens, NP-C [facility] Pgr: [phone] or if no answer [phone] 02/11/2016, 11:15 AM

## 2016-02-11 NOTE — Progress Notes (Signed)
Physical Therapy Weekly Progress Note  Patient Details  Name: Judy Spears MRN: [id number] Date of Birth: Jun 05, 1950  Beginning of progress report period: February 04, 2016 End of progress report period: February 11, 2016  Today's Date: 02/11/2016 PT Individual Time: 1255-1400 PT Individual Time Calculation (min): 65 min    Patient has met 4 of 4 short term goals.  Pt making slow progress towards goals. Bed mobility continues to require totalA to totalA +2 with improving initiation with UEs to A with task. Pt's husband brought in [place] used at home and pt demonstrates improved control and coordination with familiar equipment with maxA sit <>stand, however +2 continued for functional tasks such as managing clothing for toileting due to strength deficits limiting ability to stand with decreased assist. Sitting balance improving to close S when facilitated to midline posture, improved carryover when utilizing mirror for visual feedback. Pt's husband or caregiver for all sessions for ongoing education and discussion regarding equipment and d/c plan.   Patient continues to demonstrate the following deficits muscle weakness, muscle joint tightness and muscle paralysis, impaired timing and sequencing, abnormal tone, unbalanced muscle activation, ataxia, decreased coordination and decreased motor planning, decreased awareness and decreased problem solving and decreased sitting balance, decreased standing balance, decreased postural control and decreased balance strategies and therefore will continue to benefit from skilled PT intervention to increase functional independence with mobility.  Patient progressing toward long term goals..  Continue plan of care.  PT Short Term Goals Week 1:  PT Short Term Goal 1 (Week 1): Pt will demonstrate rolling R/L totalA +1 PT Short Term Goal 1 - Progress (Week 1): Met PT Short Term Goal 2 (Week 1): Pt will demonstrate supine<>sit with totalA +1 PT Short Term Goal 2 -  Progress (Week 1): Met PT Short Term Goal 3 (Week 1): Pt will demonstrate static sitting balance x5 min with stable vitals and BUE support PT Short Term Goal 4 (Week 1): Pt will demonstrate transfer w/c <>bed with totalA +1 PT Short Term Goal 4 - Progress (Week 1): Met PT Short Term Goal 5 (Week 1): Pt will demonstrate standing tolerance with lift equipment x5 min with stable vitals PT Short Term Goal 5 - Progress (Week 1): Met Week 2:  PT Short Term Goal 1 (Week 2): Pt will perform bed mobility maxA +1 PT Short Term Goal 2 (Week 2): Pt will perform dynamic sitting balance modA PT Short Term Goal 3 (Week 2): Pt will perform sit <>stand in stedy with consistent maxA +1 PT Short Term Goal 4 (Week 2): Pt will tolerate standing with lift eqiupment x10 min with stable vitals  Skilled Therapeutic Interventions/Progress Updates:   Pt received seated in bed, denies pain and agreeable to treatment. Vitals at beginning of session BP 148/90 RR 30 breaths/min HR 84 bpm on 6L O2 via trach collar. Auscultation reveals decreased expiratory breath sounds R upper lobe, otherwise CTA throughout. Performed 1 round percussion x2 min to upper R lobe with pt in sitting EOB with anterior lean supported by second therapist. Follow tx note improved inspiratory/expiratory breath sounds in R upper lobe. Pt tolerated tx with no significant change in vitals skin before/after WNL. Sit >stand in [place] from EOB with maxA, cues/facilitation for anterior weight shift. Sit <>stand from stedy seat with BUE support and maxA. Seated in stedy seat, pt performed hair brushing with maxA for supporting shoulder/elbow. Returned to sitting in w/c with stedy. Roho cushion deflated to improve positioning and pressure relief. Remained seated  in w/c at end of session, all needs in reach.   Therapy Documentation Precautions:  Precautions Precautions: Fall Precaution Comments: Trach, PEG, supplemental O2, PMSV Required Braces or Orthoses: Other  Brace/Splint Other Brace/Splint: Pt has B AFOs and UE splints Restrictions Weight Bearing Restrictions: No Vital Signs: Therapy Vitals Pulse Rate: 82 Resp: 18 Patient Position (if appropriate): Sitting Oxygen Therapy SpO2: 96 % O2 Device: Tracheostomy Collar O2 Flow Rate (L/min): 6 L/min FiO2 (%): 28 %   See Function Navigator for Current Functional Status.  Therapy/Group: Individual Therapy  Luberta Mutter 02/11/2016, 2:24 PM

## 2016-02-11 NOTE — Progress Notes (Signed)
[facility] PHYSICAL MEDICINE & REHABILITATION     PROGRESS NOTE    Subjective/Complaints: Up with OT working on transfers/toilet  ROS: pt denies nausea, vomiting, diarrhea, cough, shortness of breath or chest pain    Objective: Vital Signs: Blood pressure (!) 149/85, pulse 79, temperature 98.5 F (36.9 C), temperature source Oral, resp. rate 17, height 5\' 4"  (1.626 m), weight 53.6 kg (118 lb 3.2 oz), last menstrual period 12/01/2010, SpO2 100 %. Dg Chest Port 1 View  Result Date: 02/11/2016 CLINICAL DATA:  Atelectasis. EXAM: PORTABLE CHEST 1 VIEW COMPARISON:  02/06/2016. FINDINGS: Tracheostomy tube noted in stable position. Persistent but slightly improved bibasilar subsegmental atelectasis. Tiny left pleural effusion again noted. No pneumothorax. Heart size stable. No acute bony abnormality . IMPRESSION: 1. Tracheostomy tube in stable position. 2. Persistent but improving bibasilar subsegmental atelectasis. Tiny left pleural effusion again noted. Electronically Signed   By: Marcello Moores  Register   On: 02/11/2016 08:46    Recent Labs  02/10/16 0540  WBC 4.7  HGB 12.0  HCT 37.6  PLT 288    Recent Labs  02/09/16 0505  NA 136  K 3.2*  CL 94*  GLUCOSE 125*  BUN 12  CREATININE 0.38*  CALCIUM 9.6   CBG (last 3)  No results for input(s): GLUCAP in the last 72 hours.  Wt Readings from Last 3 Encounters:  02/10/16 53.6 kg (118 lb 3.2 oz)  02/03/16 52.2 kg (115 lb)  01/04/16 56.6 kg (124 lb 12.5 oz)    Physical Exam:  Constitutional: She appears well-developed. She has a sickly appearance. NAD. HENT: Normocephalicand atraumatic.  Eyes: No discharge, EOM appear intact Neck: #6 trach clean Cardiovascular: RRR,  Respiratory: no rhonchi.clear bilaterally normal effort GI: Bowel sounds are normal. She exhibits no distension.  PEG site remains clean and dry. Musculoskeletal: She exhibits no edema, no tenderness.  Neurological: She is alert and making good eye  contact Significant flexor tone LUE and LLE. LUE 1-2/4. LLE 3/4. Continued significant flexor tone in LUE and LLE.  Motor: 2-/5 RUE and RLE unchanged.  Skin: Skin is warmand dry. Macular rash on buttocks/sacrum nearly resolved Psychiatric: alert and engages  Assessment/Plan: 1. Functional, cognitive, and mobility deficits secondary to encephalopathy/debility which require 3+ hours per day of interdisciplinary therapy in a comprehensive inpatient rehab setting. Physiatrist is providing close team supervision and 24 hour management of active medical problems listed below. Physiatrist and rehab team continue to assess barriers to discharge/monitor patient progress toward functional and medical goals.  Function:  Bathing Bathing position   Position: Bed (Night Bath)  Bathing parts   Body parts bathed by helper: Right arm, Right upper leg, Left arm, Left upper leg, Chest, Abdomen, Right lower leg, Front perineal area, Left lower leg, Buttocks, Back  Bathing assist Assist Level: 2 helpers      Upper Body Dressing/Undressing Upper body dressing   What is the patient wearing?: Bra, Pull over shirt/dress   Bra - Perfomed by helper: Thread/unthread right bra strap, Thread/unthread left bra strap, Hook/unhook bra (pull down sports bra)   Pull over shirt/dress - Perfomed by helper: Thread/unthread right sleeve, Thread/unthread left sleeve, Put head through opening, Pull shirt over trunk        Upper body assist Assist Level: 2 helpers      Lower Body Dressing/Undressing Lower body dressing   What is the patient wearing?: Pants, Socks, Shoes       Pants- Performed by helper: Thread/unthread right pants leg, Thread/unthread left pants leg,  Pull pants up/down, Fasten/unfasten pants       Socks - Performed by helper: Don/doff right sock, Don/doff left sock   Shoes - Performed by helper: Don/doff right shoe, Don/doff left shoe, Fasten right, Fasten left          Lower body assist  Assist for lower body dressing: 2 Helpers      Naval architect activity did not occur: No continent bowel/bladder event   Toileting steps completed by helper: Adjust clothing prior to toileting, Performs perineal hygiene, Adjust clothing after toileting    Toileting assist Assist level: Two helpers   Transfers Chair/bed transfer   Chair/bed transfer method: Lateral scoot Chair/bed transfer assist level: Total assist (Pt < 25%) Chair/bed transfer assistive device: Sliding board, Armrests     Locomotion Ambulation Ambulation activity did not occur: Safety/medical concerns         Wheelchair       Assist Level: Dependent (Pt equals 0%)  Cognition Comprehension Comprehension assist level: Understands basic 50 - 74% of the time/ requires cueing 25 - 49% of the time  Expression Expression assist level: Expresses basic 25 - 49% of the time/requires cueing 50 - 75% of the time. Uses single words/gestures.  Social Interaction Social Interaction assist level: Interacts appropriately 50 - 74% of the time - May be physically or verbally inappropriate.  Problem Solving Problem solving assist level: Solves basic less than 25% of the time - needs direction nearly all the time or does not effectively solve problems and may need a restraint for safety  Memory Memory assist level: Recognizes or recalls 25 - 49% of the time/requires cueing 50 - 75% of the time   Medical Problem List and Plan: 1. Functional, mobility and cognitive/communication deficitssecondary to debility in setting of prior encephalopathy/tetraplegia  -continue CIR PT, OT,  SLP 2. DVT Prophylaxis/Anticoagulation: Pharmaceutical: Lovenox 3. Pain Management: tylenol prn 4. Mood: LCSW to follow for evaluation when appropriate  -seems to be in fair spirits 5. Neuropsych: This patient is notcapable of making decisions on herown behalf. 6. Skin/Wound Care: routine pressure relief measures, local care for  rash 7. Fluids/Electrolytes/Nutrition: Continue Tube feeds 125 cc from 4 pm to 8 am. Water flushes 300 cc/ 5 X day.  -continue to monitor  -replacing potassium 8. Spasticity: On baclofen qid and valium qid.  -continue valium for now. consider klonopin re-trial  -consider dantrium trial   -will discuss adjustable elbow splint with orthotist -splinting, dynamic left elbow, knee.  -ROM with therapy -botox injection performed 1/12  9. Candida UTI/C diff colitis: has completed treatment.   -continue contact precautions 10 VDRF: now with cuffless #6 trach. Seems to be tolerating PMV better  -  nebulizers prn and mucinex to loosen secretions.     -pulmonary continues to follow   -continue nebs, chest pt, OOB, suction  -sputum cx with pseudomonas-continue observation unless symptomatic/clinical change    11. ABLA  Hb 11.0 on 12/10  Cont to monitor    LOS (Days) 8 A FACE TO FACE EVALUATION WAS PERFORMED  Meredith Staggers, MD 02/11/2016 10:29 AM

## 2016-02-11 NOTE — Progress Notes (Signed)
Speech Language Pathology Daily Session Note  Patient Details  Name: Judy Spears MRN: [id number] Date of Birth: [birth year]  Today's Date: 02/11/2016 SLP Individual Time: 0815-0900 SLP Individual Time Calculation (min): 45 min   Short Term Goals: Week 1: SLP Short Term Goal 1 (Week 1): Patient will demonstrate intelligibility at the word level 25% of the time with Max A multimodal cues for use of speech intelligibility strategies.  SLP Short Term Goal 2 (Week 1): Patient will perform diaphragmatic breathing exercises with Max A multimodal cues at the vowel and word level.  SLP Short Term Goal 3 (Week 1): Patient will perform phrase completion tasks with 50% accuracy and Max A multimodal cues.  SLP Short Term Goal 4 (Week 1): Patient will utilize multimodal communication to express wants/needs with Max A multimodal cues.  SLP Short Term Goal 5 (Week 1): Patient will tolerate PMSV for 60 minutes with all vitals remaining WFL.   Skilled Therapeutic Interventions: Skilled treatment session focused on speech goals. Patient's trach has been changed to cuffless and SLP facilitated session by donning the PMSV. Patient tolerated the valve without difficulty and all vitals remaining WFL throughout session. Patient demonstrated adequate vocal intensity but continues to demonstrate decreased speech intelligibility at the word and phrase level due to severe oral-motor weakness. However, patient was ~25% intelligible at the word level when utilizing "big and loud" speech. Also discussed possible utilization of a letter board with use of eye gaze or a light attached to her head to maximize functional communication. Both the patient and her husband were agreeable to the idea. Will assess function of letter board during next session. Patient handed off to OT. Continue with current plan of care.   Function:   Cognition Comprehension Comprehension assist level: Understands basic 50 - 74% of the time/ requires  cueing 25 - 49% of the time  Expression Expression assistive device: Talk trach valve Expression assist level: Expresses basic 25 - 49% of the time/requires cueing 50 - 75% of the time. Uses single words/gestures.  Social Interaction Social Interaction assist level: Interacts appropriately 50 - 74% of the time - May be physically or verbally inappropriate.  Problem Solving Problem solving assist level: Solves basic less than 25% of the time - needs direction nearly all the time or does not effectively solve problems and may need a restraint for safety  Memory Memory assist level: Recognizes or recalls 25 - 49% of the time/requires cueing 50 - 75% of the time    Pain Pain Assessment Pain Assessment: No/denies pain Pain Score: 0-No pain  Therapy/Group: Individual Therapy  Anasophia Pecor 02/11/2016, 12:52 PM

## 2016-02-11 NOTE — Progress Notes (Signed)
Occupational Therapy Weekly Progress Note  Patient Details  Name: Judy Spears MRN: [id number] Date of Birth: 03-27-1950  Beginning of progress report period: February 04, 2016 End of progress report period: February 11, 2016  Today's Date: 02/11/2016 OT Individual Time: 0900-1000 OT Individual Time Calculation (min): 60 min    Patient has met 2 of 3 short term goals.  Timed toileting has not been initiated at this time. Pt currently requires +3 total A for transfer and toileting tasks from [facility]. Also, due to pt's decreased activity tolerance, unlikely pt will be able to tolerate effort/ time requred  Patient continues to demonstrate the following deficits:abnormal posture, acute pain, ataxia, cognitive deficits, disturbance of vision, muscular wasting and disuse atrophy, muscle weakness (generalized), pain in joint and progressive muscular atrophy and therefore will continue to benefit from skilled OT intervention to enhance overall performance with Reduce care partner burden. Pt making slow progress towards OT goals. She cont to be most limited by tone/ spasticity and limited ROM in all joints. Currently pt required total A +2 for all ADLs. Total A +1 for bed mod mobility and transfer supine> EOB and EOb> w/c. +3 required for functional transfers (i.e. BSC) and for toileting tasks from [facility].   Patient progressing toward long term goals..  Continue plan of care.  OT Short Term Goals Week 1:  OT Short Term Goal 1 (Week 1): Will initiate timed toileting for skin protection OT Short Term Goal 1 - Progress (Week 1): Not met OT Short Term Goal 2 (Week 1): Pt will initate forward lean during transfer with mod A and VCs OT Short Term Goal 2 - Progress (Week 1): Met OT Short Term Goal 3 (Week 1): Pt will complete low reach grasping with mod A in pre function task OT Short Term Goal 3 - Progress (Week 1): Met Week 2:  OT Short Term Goal 1 (Week 2): Pt will reach and grasp for grab bars of STEADY with  min A and VCs in prep for functional transfers OT Short Term Goal 2 (Week 2): Pt will maintain midline sitting for 3 minutes with assist for hand support only and supervision in prep for functional sitting task OT Short Term Goal 3 (Week 2): Pt will complete transfer via STEADY with max- total A +1 in order to reduce caregiver burden   Skilled Therapeutic Interventions/Progress Updates:    Pt seen for OT session focusing on functional grasp and UE ROM duirng functional transfers. Pt in supine upon arrival with husband present and hand off from SLP. Pt tolerating PMSV well and able to voice one word phrases ~50% of time with increased trials to make basic needs/ choices known. She transferred to EOB and voiced desire to dress UB seated EOB. +2 assist required for UB dressing due to poor dynamic sitting balance and limited UE ROM.  Pt voiced desire to use STEADY for transfer to [facility]. Required +2 to stand into STEADY with assist for placement of L LE. Upon initial stand into STEADY O2 levels dropped 60-70s and HR elevated. Pt in no sign of distress, breathing clear, and pt verbalized feeling okay. Despite extended break in STEADY vitals did not return [facility] according to in-room meausrements. RN made aware and cleared therapy to cont with therapy and cont based on pt's feel. Pt tolerated remainder of session with no s/s of distress.  Via STEADY, transferred pt to [facility], pt verbalized she needed to urinate, however, not able to void once on BSC. +3  required for clothing management. Pt returned to w/c at end of session, left with husband planning to leave room.  Throughout session, pt able to functionally grasp bars of STEADY, requiring mod A to position UEs onto support of STEADY, and with increased time able to remove UEs independently.    Therapy Documentation Precautions:  Precautions Precautions: Fall Precaution Comments: Trach, PEG, supplemental O2, PMSV Required Braces or Orthoses: Other  Brace/Splint Other Brace/Splint: Pt has B AFOs and UE splints Restrictions Weight Bearing Restrictions: No Vital Signs: O2 Device: Tracheostomy Collar O2 Flow Rate (L/min): 6 L/min FiO2 (%): 28 % Pain: Pain Assessment Pain Assessment: No/denies pain Pain Score: 0-No pain  See Function Navigator for Current Functional Status.   Therapy/Group: Individual Therapy  [name], Mirta Mally C 02/11/2016, 6:30 AM

## 2016-02-12 ENCOUNTER — Inpatient Hospital Stay (HOSPITAL_COMMUNITY): Payer: BC Managed Care – PPO | Admitting: Physical Therapy

## 2016-02-12 ENCOUNTER — Inpatient Hospital Stay (HOSPITAL_COMMUNITY): Payer: BC Managed Care – PPO | Admitting: Occupational Therapy

## 2016-02-12 ENCOUNTER — Inpatient Hospital Stay (HOSPITAL_COMMUNITY): Payer: BC Managed Care – PPO | Admitting: Speech Pathology

## 2016-02-12 LAB — GLUCOSE, CAPILLARY
GLUCOSE-CAPILLARY: 89 mg/dL (ref 65–99)
Glucose-Capillary: 111 mg/dL — ABNORMAL HIGH (ref 65–99)

## 2016-02-12 NOTE — Progress Notes (Signed)
Physical Therapy Session Note  Patient Details  Name: Judy Spears MRN: [id number] Date of Birth: 08/13/1950  Today's Date: 02/12/2016 PT Individual Time: 1300-1420 PT Individual Time Calculation (min): 80 min   Short Term Goals: Week 2:  PT Short Term Goal 1 (Week 2): Pt will perform bed mobility maxA +1 PT Short Term Goal 2 (Week 2): Pt will perform dynamic sitting balance modA PT Short Term Goal 3 (Week 2): Pt will perform sit <>stand in stedy with consistent maxA +1 PT Short Term Goal 4 (Week 2): Pt will tolerate standing with lift eqiupment x10 min with stable vitals  Skilled Therapeutic Interventions/Progress Updates: Pt received supine in bed with RN present completing hygiene and brief change. No evidence of pain and agreeable to treatment. Rolling L with maxA and use of bedrails. R sidelying>sit maxA. Sit <>stand in stedy from bed with max/totalA. Once seated in stedy seat, able to perform sit <>stand x5 reps with maxA. Sitting balance with standbyA; use of mirror visual feedback to improve midline positioning, tactile cues for R oblique activation. Sit <>stand in parallel bars x4 trials with maxA for anterior weight shifting, blocking at B knees and cues for hip extension. Semi-reclined in w/c>upright sitting for anterior weight shifting and use of UEs to assist on arm rests and bars. Upon returning to room, pt reclined due to fatigue, began gagging and returned to upright position. No emesis occurred, but remained upright in w/c at end of session and RN present at end of session to assess.      Therapy Documentation Precautions:  Precautions Precautions: Fall Precaution Comments: Trach, PEG, supplemental O2, PMSV Required Braces or Orthoses: Other Brace/Splint Other Brace/Splint: Pt has B AFOs and UE splints Restrictions Weight Bearing Restrictions: No   See Function Navigator for Current Functional Status.   Therapy/Group: Individual Therapy  Luberta Mutter 02/12/2016, 2:51 PM

## 2016-02-12 NOTE — Progress Notes (Signed)
[place] PHYSICAL MEDICINE & REHABILITATION     PROGRESS NOTE    Subjective/Complaints: Up in bed, alert. No distress  ROS: Unable to obtain due to cognitive/mental status issues.     Objective: Vital Signs: Blood pressure (!) 150/78, pulse 66, temperature 98.3 F (36.8 C), temperature source Oral, resp. rate 18, height 5\' 4"  (1.626 m), weight 50 kg (110 lb 3.7 oz), last menstrual period 12/01/2010, SpO2 100 %. Dg Chest Port 1 View  Result Date: 02/11/2016 CLINICAL DATA:  Atelectasis. EXAM: PORTABLE CHEST 1 VIEW COMPARISON:  02/06/2016. FINDINGS: Tracheostomy tube noted in stable position. Persistent but slightly improved bibasilar subsegmental atelectasis. Tiny left pleural effusion again noted. No pneumothorax. Heart size stable. No acute bony abnormality . IMPRESSION: 1. Tracheostomy tube in stable position. 2. Persistent but improving bibasilar subsegmental atelectasis. Tiny left pleural effusion again noted. Electronically Signed   By: Marcello Moores  Register   On: 02/11/2016 08:46    Recent Labs  02/10/16 0540  WBC 4.7  HGB 12.0  HCT 37.6  PLT 288   No results for input(s): NA, K, CL, GLUCOSE, BUN, CREATININE, CALCIUM in the last 72 hours.  Invalid input(s): CO CBG (last 3)  No results for input(s): GLUCAP in the last 72 hours.  Wt Readings from Last 3 Encounters:  02/12/16 50 kg (110 lb 3.7 oz)  02/03/16 52.2 kg (115 lb)  01/04/16 56.6 kg (124 lb 12.5 oz)    Physical Exam:  Constitutional: She appears well-developed.   NAD. HENT: Normocephalicand atraumatic.  Eyes: No discharge, EOM appear intact Neck: #6 trach clean, no secretions Cardiovascular: RRR,  Respiratory: no rhonchi.clear bilaterally normal effort GI: Bowel sounds are normal. She exhibits no distension.  PEG site remains clean and dry. Musculoskeletal: She exhibits no edema, no tenderness.  Neurological: She is alert and making good eye contact Significant flexor tone LUE and LLE. LUE 1-2/4. LLE  3/4. Continued significant flexor tone in LUE and LLE.  Motor: 2-/5 RUE and RLE unchanged.  Skin: Skin is warmand dry. Macular rash on buttocks/sacrum nearly resolved Psychiatric: alert and engages  Assessment/Plan: 1. Functional, cognitive, and mobility deficits secondary to encephalopathy/debility which require 3+ hours per day of interdisciplinary therapy in a comprehensive inpatient rehab setting. Physiatrist is providing close team supervision and 24 hour management of active medical problems listed below. Physiatrist and rehab team continue to assess barriers to discharge/monitor patient progress toward functional and medical goals.  Function:  Bathing Bathing position   Position: Bed (Night Bath)  Bathing parts   Body parts bathed by helper: Right arm, Right upper leg, Left arm, Left upper leg, Chest, Abdomen, Right lower leg, Front perineal area, Left lower leg, Buttocks, Back  Bathing assist Assist Level: 2 helpers      Upper Body Dressing/Undressing Upper body dressing   What is the patient wearing?: Bra, Pull over shirt/dress   Bra - Perfomed by helper: Thread/unthread right bra strap, Thread/unthread left bra strap, Hook/unhook bra (pull down sports bra)   Pull over shirt/dress - Perfomed by helper: Thread/unthread right sleeve, Thread/unthread left sleeve, Put head through opening, Pull shirt over trunk        Upper body assist Assist Level: 2 helpers      Lower Body Dressing/Undressing Lower body dressing   What is the patient wearing?: Pants, Socks, Shoes       Pants- Performed by helper: Thread/unthread right pants leg, Thread/unthread left pants leg, Pull pants up/down, Fasten/unfasten pants       Socks -  Performed by helper: Don/doff right sock, Don/doff left sock   Shoes - Performed by helper: Don/doff right shoe, Don/doff left shoe, Fasten right, Fasten left          Lower body assist Assist for lower body dressing: 2 Helpers       Toileting Toileting Toileting activity did not occur: No continent bowel/bladder event   Toileting steps completed by helper: Adjust clothing prior to toileting, Performs perineal hygiene, Adjust clothing after toileting    Toileting assist Assist level: Two helpers   Transfers Chair/bed transfer   Chair/bed transfer method: Lateral scoot Chair/bed transfer assist level: Total assist (Pt < 25%) Chair/bed transfer assistive device: Sliding board, Armrests     Locomotion Ambulation Ambulation activity did not occur: Safety/medical concerns         Wheelchair       Assist Level: Dependent (Pt equals 0%)  Cognition Comprehension Comprehension assist level: Understands basic 50 - 74% of the time/ requires cueing 25 - 49% of the time  Expression Expression assist level: Expresses basic 25 - 49% of the time/requires cueing 50 - 75% of the time. Uses single words/gestures.  Social Interaction Social Interaction assist level: Interacts appropriately 50 - 74% of the time - May be physically or verbally inappropriate.  Problem Solving Problem solving assist level: Solves basic less than 25% of the time - needs direction nearly all the time or does not effectively solve problems and may need a restraint for safety  Memory Memory assist level: Recognizes or recalls 25 - 49% of the time/requires cueing 50 - 75% of the time   Medical Problem List and Plan: 1. Functional, mobility and cognitive/communication deficitssecondary to debility in setting of prior encephalopathy/tetraplegia  -continue CIR PT, OT,  SLP  -working on improving transfers/standing 2. DVT Prophylaxis/Anticoagulation: Pharmaceutical: Lovenox 3. Pain Management: tylenol prn 4. Mood: LCSW to follow for evaluation when appropriate  -no changes 5. Neuropsych: This patient is notcapable of making decisions on herown behalf. 6. Skin/Wound Care: routine pressure relief measures, local care for rash 7.  Fluids/Electrolytes/Nutrition: Continue Tube feeds 125 cc from 4 pm to 8 am. Water flushes 300 cc/ 5 X day.    -follow up labs next week  -replacing potassium 8. Spasticity: On baclofen qid and valium qid.  -continue valium for now. consider klonopin re-trial  -consider dantrium trial   -have spoken to [employer]. They will fit her with adjustable resting elbow splint--can start at 95-100 degrees   -wear after therapies/evening -splinting, dynamic left elbow, knee.  -ROM with therapy -botox injection performed 1/12  9. Candida UTI/C diff colitis: tested + 12/27. has completed treatment.   -continue contact precautions 10 VDRF: now with cuffless #6 trach. Seems to be tolerating PMV better  -  nebulizers prn and mucinex to loosen secretions.     -pulmonary continues to follow   -continue nebs, chest pt, OOB, suction  -sputum cx with pseudomonas-continue observation unless symptomatic/clinical change    11. ABLA  Hb 11.0 on 12/10  Cont to monitor    LOS (Days) 9 A FACE TO FACE EVALUATION WAS PERFORMED  Alger Simons T, MD 02/12/2016 8:30 AM

## 2016-02-12 NOTE — Progress Notes (Signed)
Occupational Therapy Session Note  Patient Details  Name: Judy Spears MRN: [id number] Date of Birth: 1950/08/31  Today's Date: 02/12/2016 OT Individual Time: 0930-1030 OT Individual Time Calculation (min): 60 min    Short Term Goals: Week 2:  OT Short Term Goal 1 (Week 2): Pt will reach and grasp for grab bars of STEADY with min A and VCs in prep for functional transfers OT Short Term Goal 2 (Week 2): Pt will maintain midline sitting for 3 minutes with assist for hand support only and supervision in prep for functional sitting task OT Short Term Goal 3 (Week 2): Pt will complete transfer via STEADY with max- total A +1 in order to reduce caregiver burden  Skilled Therapeutic Interventions/Progress Updates:    Pt seen for OT session focusing on functional sitting balance and weightshift. Pt sitting EOB upon arrival with support from husband, pt appearing awake and alert, ready for tx session.  STEADY used to transfer pt to w/c, pt able to reach with L UE to place hand on support bar, mod-max A required for advanacement of L UE. +2 required for entry into STEADY and transfer to w/c. In therapy gym, pt completed total A sliding board transfer completed to therapy mat.  Seated on EOM, dycem placed under B feet and yoga block btwn feet to facilitate weightbearing through B LEs. Completed mini latera leans to L and R with support of physioball under respective arm. Pt abe to lean to L without assist and to return to midline with VCs. Posterior LOB requiring support from behind, however able to complete R<> L lean with guarding assist. Pt required min-mod A for R lean, able to return to midline with same assist listed above. Addressing postural control/ support with emphasis on L oblique elongation. Pt tolerated well with no complaints of pain. Table placed in front of pt, assist to place B UEs on table, however, when positioned with anterior weightshift, pt able to maintain when UEs on table.  Attempted with UEs in lap and pt unable to maintain upright sitting due to posterior LOB. Pt on 6 L supplemental O2 throughout session, no s/s of distress. Pt able to verbalize 2-3 word needs throughout session, improvements from previous sessions.  Pt completed sliding board transfer back to w/c at end of session, given visual cue to reach to "hug therapist" with much better anterior lean during transfer with VCs to maintain. Pt returned to room with assist of husband and personal caregiver.   Therapy Documentation Precautions:  Precautions Precautions: Fall Precaution Comments: Trach, PEG, supplemental O2, PMSV Required Braces or Orthoses: Other Brace/Splint Other Brace/Splint: Pt has B AFOs and UE splints Restrictions Weight Bearing Restrictions: No  See Function Navigator for Current Functional Status.   Therapy/Group: Individual Therapy  [name], Robynne Roat C 02/12/2016, 7:07 AM

## 2016-02-12 NOTE — Patient Care Conference (Signed)
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 Acute on chronic respiratory failure with hypoxia ([place])   . Acute hypoxemic respiratory failure ([place])   . Acute blood loss anemia   . Atelectasis 02/04/2016  . Debility 02/03/2016  . Pneumonia of both lower lobes due to Pseudomonas species ([place])   . Pneumonia of both lower lobes due to methicillin susceptible Staphylococcus aureus (MSSA) ([place])   . Acute respiratory failure with hypoxia ([place])   . Acute encephalopathy   . Seizures ([place])   . Sepsis ([place])   . Hypoxia   . Pain   . HCAP (healthcare-associated pneumonia) 12/28/2015  . Chronic respiratory failure ([place]) 12/28/2015  . Acute tracheobronchitis 12/24/2015  . Tracheostomy tube present ([place])   . Tracheal stenosis   . Chronic respiratory failure with hypoxia ([place])   . 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 Tracheostomy status ([place]) 09/26/2015  . Allergic rhinitis 09/26/2015  . 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 Movement disorder 09/22/2015  . Encephalitis 09/22/2015  . Chest pain 02/07/2014  . Premature atrial contractions 01/13/2014  . Abnormality of gait 12/04/2013  . Hyperlipidemia 12/21/2011  . Cardiovascular risk factor 12/21/2011  . Bunion 01/31/2008  . Metatarsalgia of both feet 09/20/2007  . FLAT FOOT 09/20/2007  . HALLUX RIGIDUS, ACQUIRED 09/20/2007    Expected Discharge Date: Expected Discharge Date:  02/25/16  Team Members Present: Physician leading conference: Dr. Alger Simons Social Worker Present: Lennart Pall, LCSW Nurse Present: Dorien Chihuahua, RN PT Present: Kem Parkinson, PT OT Present: Napoleon Form, Artemio Aly, OT SLP Present: Weston Anna, SLP PPS Coordinator present : Daiva Nakayama, RN, CRRN     Current Status/Progress Goal Weekly Team Focus  Medical   continued tone L>R. tolerating trach---pulmonary helping with mgt. more awake/sleeping at night  improve tone, decrease secretions/improve pulmonary management  trach, tone, sleep, nutrition   Bowel/Bladder   Continent at times but otherwise Incontinent of B/B. LBM 02/09/16. Enteric precautions R/T C. Diff.  Maintain B/B continence with max assistance.  Offer toileting Q2H and PRN. Change diaper PRN. Maintain enteric precautions.   Swallow/Nutrition/ Hydration   NPO with PEG  No dysphagia goals at this time due to respiratory status      ADL's   max-total A +2 for ADLs and all mobility  max- total A +1 overall  Neuro re-ed, sitting balance, postural alignment, breath support, caregiver training   Mobility   maxA +2 bed mobility, maxA +2 sit <>stand in stedy, S static sitting balance, min/modA dynamic sitting balance  MaxA bed mobility, maxA transfers w/c <>bed, S dynamic sitting balance  sit <>stand in stedy for functional transfers, dynamic sitting balance and postural retraining   Communication   #6 cuffed trach, inconsistent tolerance of PMSV, Max-Total A for functional communication  Max A  Tolerance of PMSV, speech intelligibility at word level, functional communication    Safety/Cognition/ Behavioral Observations  Pain   Denies any current pain or discomforts. Scheduled valium and baclofen for muscle spasms. PRN acetaminophen for pain.  Maintain comfortable pain level on a scale of 0-10 with a goal of less than 3.  Assess and monitor pain Q shift and PRN. Treat accordingly.   Skin   MASD to  buttocks with Gerherdts cream and nystatin powder to bottom.  Prevent further and new skin break down.  Assess and treat skin break down Q Shift and PRN. Monitor for changes in skin conditions.    Rehab Goals Patient on target to meet rehab goals: Yes *See Care Plan and progress notes for long and short-term goals.  Barriers to Discharge: profound neurological deficits    Possible Resolutions to Barriers:  24 hour care at home, education of caregivers. use of orthotics/AE    Discharge Planning/Teaching Needs:  Plan home with husband and 24/7 caregivers  ongoing   Team Discussion:  Anticipating #6 trach for foreseeable future per pulm.  botox last Friday and hope to see some results in ~ one week.  MASD much improved.  Poor tolerance for PMSV and need to limit use per pulm.  Max assist bed mobility and +2 with stedy.  Hope to get as close as possible to baseline with max - total A + 1.  Much discussion between MD and therapies about positioning, contractures, splints, etc.  Revisions to Treatment Plan: None   Continued Need for Acute Rehabilitation Level of Care: The patient requires daily medical management by a physician with specialized training in physical medicine and rehabilitation for the following conditions: Daily direction of a multidisciplinary physical rehabilitation program to ensure safe treatment while eliciting the highest outcome that is of practical value to the patient.: Yes Daily medical management of patient stability for increased activity during participation in an intensive rehabilitation regime.: Yes Daily analysis of laboratory values and/or radiology reports with any subsequent need for medication adjustment of medical intervention for : Neurological problems;Post surgical problems  Sebastain Fishbaugh 02/12/2016, 11:40 AM

## 2016-02-12 NOTE — Progress Notes (Signed)
Speech Language Pathology Weekly Progress and Session Note  Patient Details  Name: Judy Spears MRN: [id number] Date of Birth: 17-Dec-1950  Beginning of progress report period: February 04, 2016 End of progress report period: February 12, 2016  Today's Date: 02/12/2016 SLP Individual Time: 1100-1200 SLP Individual Time Calculation (min): 60 min  Short Term Goals: Week 1: SLP Short Term Goal 1 (Week 1): Patient will demonstrate intelligibility at the word level 25% of the time with Max A multimodal cues for use of speech intelligibility strategies.  SLP Short Term Goal 1 - Progress (Week 1): Met SLP Short Term Goal 2 (Week 1): Patient will perform diaphragmatic breathing exercises with Max A multimodal cues at the vowel and word level.  SLP Short Term Goal 2 - Progress (Week 1): Not met SLP Short Term Goal 3 (Week 1): Patient will perform phrase completion tasks with 50% accuracy and Max A multimodal cues.  SLP Short Term Goal 3 - Progress (Week 1): Met SLP Short Term Goal 4 (Week 1): Patient will utilize multimodal communication to express wants/needs with Max A multimodal cues.  SLP Short Term Goal 4 - Progress (Week 1): Not met SLP Short Term Goal 5 (Week 1): Patient will tolerate PMSV for 60 minutes with all vitals remaining WFL.  SLP Short Term Goal 5 - Progress (Week 1): Met    New Short Term Goals: Week 2: SLP Short Term Goal 1 (Week 2): Patient will demonstrate intelligibility at the word level 50% of the time with Max A multimodal cues for use of speech intelligibility strategies.  SLP Short Term Goal 2 (Week 2): Patient will demonstrate intelligibility at the phrase level 25% of the time with Max A multimodal cues for use of speech intelligibility strategies.  SLP Short Term Goal 3 (Week 2): Patient will perform diaphragmatic breathing exercises with Max A multimodal cues.  SLP Short Term Goal 4 (Week 2): Patient will utilize multimodal communication to express wants/needs with  Max A multimodal cues.   Weekly Progress Updates: Patient has made progress and has met 3 of 5 STGs this reporting period. Currently, patient is tolerating the PMSV during all waking hours with vitals remaining WFL. Patient currently requires overall Max A multimodal cues to achieve 25% intelligibility at the word level with use of strategies. Patient is also currently utilizing a letter board with Max A  Multimodal cues to work towards use of functional communication. Patient and family education is ongoing. Patient would benefit from continued skilled SLP intervention to maximize her overall functional communication prior to discharge.      Intensity: Minumum of 1-2 x/day, 30 to 90 minutes Frequency: 3 to 5 out of 7 days Duration/Length of Stay: 1/31 Treatment/Interventions: Cognitive remediation/compensation;Cueing hierarchy;Environmental controls;Internal/external aids;Speech/Language facilitation;Therapeutic Activities;Functional tasks;Patient/family education   Daily Session  Skilled Therapeutic Interventions: Skilled treatment session focused on functional communication. Upon arrival, PMSV was in place and patient tolerated it with all vitals remaining WFL. SLP facilitated session by providing Max A verbal cues for patient to achieve 25% intelligibility at the word level and 10% at the phrase level with use of speech intelligibility strategies. Patient also required Max A verbal cues to utilize a letter board with a laser attached to her glasses to spell CVC words efficiently in order to work towards use for functional communication. Patient left upright in wheelchair with family present. Continue with current plan of care.     Function:    Cognition Comprehension Comprehension assist level: Understands basic 50 -  74% of the time/ requires cueing 25 - 49% of the time  Expression Expression assistive device: Talk trach valve Expression assist level: Expresses basic 25 - 49% of the  time/requires cueing 50 - 75% of the time. Uses single words/gestures.  Social Interaction Social Interaction assist level: Interacts appropriately 25 - 49% of time - Needs frequent redirection.  Problem Solving Problem solving assist level: Solves basic less than 25% of the time - needs direction nearly all the time or does not effectively solve problems and may need a restraint for safety  Memory Memory assist level: Recognizes or recalls 25 - 49% of the time/requires cueing 50 - 75% of the time   Pain No/Denies Pain   Therapy/Group: Individual Therapy  Sharrieff Spratlin, [place] 02/12/2016, 4:06 PM

## 2016-02-13 ENCOUNTER — Inpatient Hospital Stay (HOSPITAL_COMMUNITY): Payer: BC Managed Care – PPO | Admitting: Occupational Therapy

## 2016-02-13 ENCOUNTER — Inpatient Hospital Stay (HOSPITAL_COMMUNITY): Payer: BC Managed Care – PPO | Admitting: Speech Pathology

## 2016-02-13 ENCOUNTER — Inpatient Hospital Stay (HOSPITAL_COMMUNITY): Payer: BC Managed Care – PPO | Admitting: Physical Therapy

## 2016-02-13 MED ORDER — OSMOLITE 1.5 CAL PO LIQD
1000.0000 mL | ORAL | Status: DC
  Administered 2016-02-13 – 2016-02-17 (×6): 1000 mL
  Filled 2016-02-13 (×7): qty 1000

## 2016-02-13 NOTE — Progress Notes (Signed)
Occupational Therapy Session Note  Patient Details  Name: Judy Spears MRN: [id number] Date of Birth: [birth year]  Today's Date: 02/13/2016 OT Individual Time: 0900-1000 OT Individual Time Calculation (min): 60 min    Short Term Goals: Week 2:  OT Short Term Goal 1 (Week 2): Pt will reach and grasp for grab bars of STEADY with min A and VCs in prep for functional transfers OT Short Term Goal 2 (Week 2): Pt will maintain midline sitting for 3 minutes with assist for hand support only and supervision in prep for functional sitting task OT Short Term Goal 3 (Week 2): Pt will complete transfer via STEADY with max- total A +1 in order to reduce caregiver burden  Skilled Therapeutic Interventions/Progress Updates:    Pt seen for OT session focusing on functional grasping and reaching in context of dressing session. Pt in supine upon arrivla, RN exiting and husband present performing oral hygiene. Pt voiced agreement to tx session, much more verbal and intelligible this session compared to previous. Pt's husband and personal care attendant present throughout session for ongoing education and hands on assist.  Pants donned total A in supine, rolling with max A +1 to have pants pulled up.  She transferred to EOB total A. Seated EOB, worked on [employer] dressing tasks with pt required to reach forward to obtain clothing item promoting anterior weightshift and grasping abilities required for functional transfers. She required min-mod A at elbow to assist with reaching with R UE and increased time with limited ROM to advance L UE to grasp item. She demonstrated functional grasp with each hand. Mod-max A required for sitting balance while pt worked on [place], therefore +2 assist required for task.  Worked on sit <> stand from EOB into STEADY with verbal, visual, and tactile cues provided for anterior weightshift required to reduce caregiver burden during transfer and promote pt involvement in transfer.  Completed x2 sit <> stands with +2 assist to manage STEADY seated when pt standing. Towel placed btwn pt's feet to facilitate shoulder width stance for increased BOS.  Pt then verbalized need for toileting task. STEADY used for transfer to [facility], however, pt was unable to hold urine until time over [facility] and brief soiled. +2 assist required for standing toileting tasks in STEADY with rest breaks provided seated in STEADY during clothing management. Pt returned to w/c, left with husband and caregivers present, PT entering. Pt voicing increased pain in L LE at end of session, RN made aware.   Therapy Documentation Precautions:  Precautions Precautions: Fall Precaution Comments: Trach, PEG, supplemental O2, PMSV Required Braces or Orthoses: Other Brace/Splint Other Brace/Splint: Pt has B AFOs and UE splints Restrictions Weight Bearing Restrictions: No Pain:    See Function Navigator for Current Functional Status.   Therapy/Group: Individual Therapy  [name], Cordera Stineman C 02/13/2016, 7:08 AM

## 2016-02-13 NOTE — Progress Notes (Signed)
Physical Therapy Session Note  Patient Details  Name: Judy Spears MRN: [id number] Date of Birth: 26-Jul-1950  Today's Date: 02/13/2016 PT Individual Time: [id number] and 1345-1455 PT Individual Time Calculation (min): 60 min and 70 min (total 130 min)   Short Term Goals: Week 2:  PT Short Term Goal 1 (Week 2): Pt will perform bed mobility maxA +1 PT Short Term Goal 2 (Week 2): Pt will perform dynamic sitting balance modA PT Short Term Goal 3 (Week 2): Pt will perform sit <>stand in stedy with consistent maxA +1 PT Short Term Goal 4 (Week 2): Pt will tolerate standing with lift eqiupment x10 min with stable vitals  Skilled Therapeutic Interventions/Progress Updates: Tx 1: Pt received seated in w/c, c/o pain as below after recently receiving pain medication and agreeable to treatment. Per OT, pt requests to try regular toilet as opposed to [facility]. Transfer w/c <>toilet with stedy and +2/+3A (could have been performed +2 however therapist, pt's husband, and pt's caregiver all present providing assist). On toilet pt able to void easily and quickly. Performs hygiene with setupA using toilet paper, however caregiver performed again with washcloth for hygiene purposes. Seated in stedy, pt performed hand washing with modI due to LUE incoordination and elbow ROM limitations. Sit <>stand in stedy at day room window for increased motivation to see snow outside. MaxA to boost from w/c into stedy seat. Once in stedy seat performed >15 reps of sit <>stand with variable min>maxA. Standing tolerance unassisted up to 15 sec at most in partial stand. Cues for hip extension and forward weight shift during task. Pt remained seated in w/c at end of session with caregiver present for transport back to room.  Tx 2: Session at 1300 moved to 1345 due to pt recent vomiting; requesting rest break before participation. Pt received seated in bed, denies pain and reporting feeling better after emesis, and agreeable to treatment.  Rolling R with maxA and use of bedrails. MaxA sidelying>sit. Transfer with stedy bed >w/c with maxA +2 for parts management. Transfer w/c <>regular toilet with maxA +2. Pt able to maintain near-full standing with min guard while therapist performed hygiene. ModA for sitting balance while pt using restroom. Squat pivot transfer w/c <>mat table totalA for energy/time management. R lateral leans with stability ball for R trunk shortening/L trunk lengthening. R lateral lean onto R elbow with pillow under R thorax for L trunk stretching; manual overpressure at L pelvis for increased stretch. Returned to w/c as above. Remained seated in w/c at end of session all needs in reach and caregiver present; RN alerted to pt/caregiver request for suction.      Therapy Documentation Precautions:  Precautions Precautions: Fall Precaution Comments: Trach, PEG, supplemental O2, PMSV Required Braces or Orthoses: Other Brace/Splint Other Brace/Splint: Pt has B AFOs and UE splints Restrictions Weight Bearing Restrictions: No Pain: Pain Assessment Pain Assessment: 0-10 Pain Score: 5  Pain Location: Leg Pain Orientation: Left;Upper Pain Descriptors / Indicators: Aching Pain Frequency: Occasional Pain Onset: With Activity Pain Intervention(s): Medication (See eMAR)   See Function Navigator for Current Functional Status.   Therapy/Group: Individual Therapy  Luberta Mutter 02/13/2016, 11:22 AM

## 2016-02-13 NOTE — Progress Notes (Signed)
[facility]     PROGRESS NOTE    Subjective/Complaints: Sitting in bed. Very alert. Speaking more  ROS: Unable to obtain due to cognitive/mental status issues.      Objective: Vital Signs: Blood pressure 136/77, pulse 84, temperature 98.3 F (36.8 C), temperature source Oral, resp. rate (!) 22, height 5\' 4"  (1.626 m), weight 50 kg (110 lb 3.7 oz), last menstrual period 12/01/2010, SpO2 100 %. No results found. No results for input(s): WBC, HGB, HCT, PLT in the last 72 hours. No results for input(s): NA, K, CL, GLUCOSE, BUN, CREATININE, CALCIUM in the last 72 hours.  Invalid input(s): CO CBG (last 3)   Recent Labs  02/12/16 1132 02/12/16 1643  GLUCAP 111* 89    Wt Readings from Last 3 Encounters:  02/12/16 50 kg (110 lb 3.7 oz)  02/03/16 52.2 kg (115 lb)  01/04/16 56.6 kg (124 lb 12.5 oz)    Physical Exam:  Constitutional: She appears well-developed.   NAD. HENT: Normocephalicand atraumatic.  Eyes: No discharge, EOM appear intact Neck: #6 trach clean/area inatct Cardiovascular: RRR,  Respiratory: no rhonchi GI: Bowel sounds are normal. She exhibits no distension.  PEG site remains clean and dry. Musculoskeletal: She exhibits no edema, no tenderness.  Neurological: She is alert and making good eye contact Significant flexor tone LUE and LLE. LUE 1-2/4. LLE 3/4. Continued significant flexor tone in LUE and LLE.   -able to range left elbow to 100 deg, left knee to 155 deg Motor: 2-/5 RUE and RLE unchanged.  Skin: Skin is warmand dry. Macular rash on buttocks/sacrum nearly resolved Psychiatric: alert and engages  Assessment/Plan: 1. Functional, cognitive, and mobility deficits secondary to encephalopathy/debility which require 3+ hours per day of interdisciplinary therapy in a comprehensive inpatient rehab setting. Physiatrist is providing close team supervision and 24 hour management of active medical problems listed  below. Physiatrist and rehab team continue to assess barriers to discharge/monitor patient progress toward functional and medical goals.  Function:  Bathing Bathing position   Position: Bed (Night Bath)  Bathing parts   Body parts bathed by helper: Right arm, Right upper leg, Left arm, Left upper leg, Chest, Abdomen, Right lower leg, Front perineal area, Left lower leg, Buttocks, Back  Bathing assist Assist Level: 2 helpers      Upper Body Dressing/Undressing Upper body dressing   What is the patient wearing?: Bra, Pull over shirt/dress   Bra - Perfomed by helper: Thread/unthread right bra strap, Thread/unthread left bra strap, Hook/unhook bra (pull down sports bra)   Pull over shirt/dress - Perfomed by helper: Thread/unthread right sleeve, Thread/unthread left sleeve, Put head through opening, Pull shirt over trunk        Upper body assist Assist Level: 2 helpers      Lower Body Dressing/Undressing Lower body dressing   What is the patient wearing?: Pants, Socks, Shoes       Pants- Performed by helper: Thread/unthread right pants leg, Thread/unthread left pants leg, Pull pants up/down, Fasten/unfasten pants       Socks - Performed by helper: Don/doff right sock, Don/doff left sock   Shoes - Performed by helper: Don/doff right shoe, Don/doff left shoe, Fasten right, Fasten left          Lower body assist Assist for lower body dressing: 2 Automotive engineer activity did not occur: No continent bowel/bladder event   Toileting steps completed by helper: Adjust clothing prior to toileting, Performs  perineal hygiene, Adjust clothing after toileting    Toileting assist Assist level: Two helpers   Transfers Chair/bed transfer   Chair/bed transfer method: Lateral scoot Chair/bed transfer assist level: Total assist (Pt < 25%) Chair/bed transfer assistive device: Sliding board, Armrests     Locomotion Ambulation Ambulation activity did not  occur: Safety/medical concerns         Wheelchair       Assist Level: Dependent (Pt equals 0%)  Cognition Comprehension Comprehension assist level: Understands basic 50 - 74% of the time/ requires cueing 25 - 49% of the time  Expression Expression assist level: Expresses basic 25 - 49% of the time/requires cueing 50 - 75% of the time. Uses single words/gestures.  Social Interaction Social Interaction assist level: Interacts appropriately 25 - 49% of time - Needs frequent redirection.  Problem Solving Problem solving assist level: Solves basic less than 25% of the time - needs direction nearly all the time or does not effectively solve problems and may need a restraint for safety  Memory Memory assist level: Recognizes or recalls 25 - 49% of the time/requires cueing 50 - 75% of the time   Medical Problem List and Plan: 1. Functional, mobility and cognitive/communication deficitssecondary to debility in setting of prior encephalopathy/tetraplegia  -continue CIR PT, OT,  SLP  -pt showing some improvements with activity tolerance. Certainly more cognitively alert and interactive 2. DVT Prophylaxis/Anticoagulation: Pharmaceutical: Lovenox 3. Pain Management: tylenol prn 4. Mood: LCSW to follow for evaluation when appropriate  -no changes 5. Neuropsych: This patient is notcapable of making decisions on herown behalf. 6. Skin/Wound Care: routine pressure relief measures, local care for rash 7. Fluids/Electrolytes/Nutrition: Continue Tube feeds 125 cc from 4 pm to 8 am. Water flushes 300 cc/ 5 X day.    -follow up labs next week  -replacing potassium 8. Spasticity: On baclofen qid and valium qid.  -continue valium for now. consider klonopin re-trial  -consider dantrium trial   -[employer] to deliver resting elbow splint/adjustable-can start at  100 degrees   -wear after therapies/evening -splinting, dynamic left elbow, knee.  -ROM with  therapy -botox injection performed 1/12 --should start seeing impact this coming week---see some improvement already. 9. Candida UTI/C diff colitis: tested + 12/27. has completed treatment.   -continue contact precautions 10 VDRF: now with cuffless #6 trach. Seems to be tolerating PMV better  -  nebulizers prn and mucinex to loosen secretions.     -pulmonary continues to follow   -continue nebs, chest pt, OOB, suction  -sputum cx with pseudomonas-continue observation unless symptomatic/clinical change    11. ABLA  Hb 11.0 on 12/10  Cont to monitor    LOS (Days) 10 A FACE TO FACE EVALUATION WAS PERFORMED  Meredith Staggers, MD 02/13/2016 9:43 AM

## 2016-02-13 NOTE — Progress Notes (Signed)
Nutrition Follow-up  DOCUMENTATION CODES:   Not applicable  INTERVENTION:  Continue home TF regimen: Nocturnal tube feeds of Osmolite 1.5 formula via PEG at goal rate of 125 ml/hr x 16 hours (4pm-8am) to provide 3000 kcal, 125 grams of protein, and 1520 ml of free water.   Continue free water flushes of 300 ml given 5 times daily per tube.  RD to continue to monitor.   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 Hospital course significant for vent dependence with need for recanalization of trach and decline in MS due to refractory seizures despite multiple AEDs. She was transferred to [facility] on 12/10 for management and monitoring. 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 She was weaned to ATC on 01/1 and tolerating PMSV.  Pt was unavailable during attempted time of visit. Nursing staff was busy working with patient. RD to continue with current tube feeding orders as pt has been tolerating it well. Weight fluctuating however mostly stable. RD to continue to monitor.   Diet Order:  Diet NPO time specified  Skin:  Reviewed, no issues  Last BM:  1/18  Height:   Ht Readings from Last 1 Encounters:  02/03/16 5' 4" (1.626 m)     Weight:   Wt Readings from Last 1 Encounters:  02/12/16 110 lb 3.7 oz (50 kg)    Ideal Body Weight:  54.5 kg  BMI:  Body mass index is 18.92 kg/m.  Estimated Nutritional Needs:   Kcal:  1900-2100 minimum  Protein:  95-115 grams minimum  Fluid:  1.9 - 2.1 L/day minimum  EDUCATION NEEDS:   No education needs identified at this time  Stephanie Craig, MS, RD, LDN Pager # [phone] After hours/ weekend pager # [phone]  

## 2016-02-13 NOTE — Progress Notes (Signed)
CPT held due to patient vomiting and nauseous throughout the day. Will attempt again tomorrow.

## 2016-02-13 NOTE — Progress Notes (Signed)
Speech Language Pathology Daily Session Note  Patient Details  Name: Judy Spears MRN: [id number] Date of Birth: 22-Jul-1950  Today's Date: 02/13/2016 SLP Individual Time: 1500-1530 SLP Individual Time Calculation (min): 30 min  Short Term Goals: Week 2: SLP Short Term Goal 1 (Week 2): Patient will demonstrate intelligibility at the word level 50% of the time with Max A multimodal cues for use of speech intelligibility strategies.  SLP Short Term Goal 2 (Week 2): Patient will demonstrate intelligibility at the phrase level 25% of the time with Max A multimodal cues for use of speech intelligibility strategies.  SLP Short Term Goal 3 (Week 2): Patient will perform diaphragmatic breathing exercises with Max A multimodal cues.  SLP Short Term Goal 4 (Week 2): Patient will utilize multimodal communication to express wants/needs with Max A multimodal cues.   Skilled Therapeutic Interventions: Skilled treatment session focused on communication goals. SLP facilitated session by providing Max A multiple multimodal cues for speech intelligibility at the phrase level to achieve ~10% intelligibility with PMV in place. Pt able to imitate diaphragmatic breathing x 2 with Max A multimodal cues. She attempted to use laser pointer attached to her eyeglasses but was not able to effectively locate letters within simple CVC words, suspect d/t fatigue. Pt was left upright in wheelchair with friend present.      Function:    Cognition Comprehension Comprehension assist level: Understands basic 50 - 74% of the time/ requires cueing 25 - 49% of the time  Expression Expression assistive device: Talk trach valve Expression assist level: Expresses basic 25 - 49% of the time/requires cueing 50 - 75% of the time. Uses single words/gestures.  Social Interaction Social Interaction assist level: Interacts appropriately 25 - 49% of time - Needs frequent redirection.  Problem Solving Problem solving assist level: Solves  basic less than 25% of the time - needs direction nearly all the time or does not effectively solve problems and may need a restraint for safety  Memory Memory assist level: Recognizes or recalls 25 - 49% of the time/requires cueing 50 - 75% of the time    Pain    Therapy/Group: Individual Therapy  Acea Yagi 02/13/2016, 4:18 PM

## 2016-02-14 ENCOUNTER — Inpatient Hospital Stay (HOSPITAL_COMMUNITY): Payer: BC Managed Care – PPO | Admitting: Speech Pathology

## 2016-02-14 ENCOUNTER — Inpatient Hospital Stay (HOSPITAL_COMMUNITY): Payer: BC Managed Care – PPO | Admitting: Occupational Therapy

## 2016-02-14 MED ORDER — DIAZEPAM 2 MG PO TABS
1.0000 mg | ORAL_TABLET | Freq: Three times a day (TID) | ORAL | Status: DC
  Administered 2016-02-14 – 2016-02-16 (×8): 1 mg
  Filled 2016-02-14 (×7): qty 1

## 2016-02-14 MED ORDER — ORAL CARE MOUTH RINSE
15.0000 mL | Freq: Two times a day (BID) | OROMUCOSAL | Status: DC
  Administered 2016-02-14 – 2016-02-24 (×21): 15 mL via OROMUCOSAL

## 2016-02-14 NOTE — Progress Notes (Signed)
Orthopedic Tech Progress Note Patient Details:  Judy Spears Sep 05, 1950 [id number]  Patient ID: [name], female   DOB: March 05, 1950, 66 y.o.   MRN: [id number]   [name] 02/14/2016, 1:21 PM Called in advanced brace order; spoke with the answering service

## 2016-02-14 NOTE — Progress Notes (Signed)
Speech Language Pathology Daily Session Note  Patient Details  Name: Judy Spears MRN: [id number] Date of Birth: 12/22/1950  Today's Date: 02/14/2016 SLP Individual Time: 1335-1405 SLP Individual Time Calculation (min): 30 min  Short Term Goals: Week 2: SLP Short Term Goal 1 (Week 2): Patient will demonstrate intelligibility at the word level 50% of the time with Max A multimodal cues for use of speech intelligibility strategies.  SLP Short Term Goal 2 (Week 2): Patient will demonstrate intelligibility at the phrase level 25% of the time with Max A multimodal cues for use of speech intelligibility strategies.  SLP Short Term Goal 3 (Week 2): Patient will perform diaphragmatic breathing exercises with Max A multimodal cues.  SLP Short Term Goal 4 (Week 2): Patient will utilize multimodal communication to express wants/needs with Max A multimodal cues.   Skilled Therapeutic Interventions: Pt was seen for skilled ST targeting communication goals.  Pt had large incontinent bowel movement prior to therapist's arrival and indicated that she was "embarassed" while therapist and nurse tech were performing hygiene and donning clean brief with min assist verbal cues to clarify message due to decreased speech intelligibility.  Once transferred out of bed and into wheelchair, pt was able to utilize eye gaze with communication board to spell biographical and simple functional information at the word level (i.e. Name, pain, bed, yes/no).  Pt was also able to verbalize the names of letters as she was spelling words for ~75% intelligibility.  Pt attempting to verbally communicate with therapist as she was leaving but also had episode of increased coughing on her secretions and needed mod assist verbal cues for cessation of talking while coughing.  Pt wore speaking valve for the duration of today's therapy session with audible voicing and no obvious s/s of distress.  Pt's husband verbalized understanding of  parameters of valve use and reported pt was only using valve when signed off family members and visitors were present.       Function:  Eating Eating Eating activity did not occur: Safety/medical concerns Modified Consistency Diet: No Eating Assist Level: Helper performs IV, parenteral or tube feed           Cognition Comprehension Comprehension assist level: Understands basic 75 - 89% of the time/ requires cueing 10 - 24% of the time  Expression Expression assistive device: Talk trach valve Expression assist level: Expresses basic 25 - 49% of the time/requires cueing 50 - 75% of the time. Uses single words/gestures.  Social Interaction Social Interaction assist level: Interacts appropriately 25 - 49% of time - Needs frequent redirection.  Problem Solving Problem solving assist level: Solves basic less than 25% of the time - needs direction nearly all the time or does not effectively solve problems and may need a restraint for safety  Memory Memory assist level: Recognizes or recalls 25 - 49% of the time/requires cueing 50 - 75% of the time    Pain Pain Assessment Pain Assessment: No/denies pain  Therapy/Group: Individual Therapy  Liisa Picone, Selinda Orion 02/14/2016, 4:16 PM

## 2016-02-14 NOTE — Progress Notes (Signed)
[facility] PHYSICAL MEDICINE & REHABILITATION     PROGRESS NOTE    Subjective/Complaints: Lying awake in bed. Very alert! Responded with a "yes" that her night was ok  ROS: Unable to obtain due to cognitive/mental status issues. .      Objective: Vital Signs: Blood pressure 138/76, pulse 88, temperature 98 F (36.7 C), temperature source Oral, resp. rate 20, height 5\' 4"  (1.626 m), weight 50 kg (110 lb 3.7 oz), last menstrual period 12/01/2010, SpO2 100 %. No results found. No results for input(s): WBC, HGB, HCT, PLT in the last 72 hours. No results for input(s): NA, K, CL, GLUCOSE, BUN, CREATININE, CALCIUM in the last 72 hours.  Invalid input(s): CO CBG (last 3)   Recent Labs  02/12/16 1132 02/12/16 1643  GLUCAP 111* 89    Wt Readings from Last 3 Encounters:  02/14/16 50 kg (110 lb 3.7 oz)  02/03/16 52.2 kg (115 lb)  01/04/16 56.6 kg (124 lb 12.5 oz)    Physical Exam:  Constitutional: She appears well-developed.   NAD. HENT: Normocephalicand atraumatic.  Eyes: No discharge, EOM appear intact Neck: #6 trach clean/area inatct. Vocal quality improving Cardiovascular: RRR,  Respiratory: no rhonchi GI: Bowel sounds are normal. She exhibits no distension.  PEG site remains clean and dry. Musculoskeletal: She exhibits no edema, no tenderness.  Neurological: She is alert and making good eye contact Significant flexor tone LUE and LLE. LUE 1-2/4. LLE 3/4. Continued significant flexor tone in LUE and LLE.   -able to range left elbow to 100 deg, left knee to 155 deg  -she is actively extending elbow now to end range. Also to an extent with knee Motor: 2-/5 RUE and RLE unchanged.  Skin: Skin is warmand dry. Macular rash on buttocks/sacrum nearly resolved Psychiatric: alert and engages  Assessment/Plan: 1. Functional, cognitive, and mobility deficits secondary to encephalopathy/debility which require 3+ hours per day of interdisciplinary therapy in a comprehensive  inpatient rehab setting. Physiatrist is providing close team supervision and 24 hour management of active medical problems listed below. Physiatrist and rehab team continue to assess barriers to discharge/monitor patient progress toward functional and medical goals.  Function:  Bathing Bathing position   Position: Bed (Night Bath)  Bathing parts   Body parts bathed by helper: Right arm, Right upper leg, Left arm, Left upper leg, Chest, Abdomen, Right lower leg, Front perineal area, Left lower leg, Buttocks, Back  Bathing assist Assist Level: 2 helpers      Upper Body Dressing/Undressing Upper body dressing   What is the patient wearing?: Bra, Pull over shirt/dress   Bra - Perfomed by helper: Thread/unthread right bra strap, Thread/unthread left bra strap, Hook/unhook bra (pull down sports bra)   Pull over shirt/dress - Perfomed by helper: Thread/unthread right sleeve, Thread/unthread left sleeve, Put head through opening, Pull shirt over trunk        Upper body assist Assist Level: 2 helpers      Lower Body Dressing/Undressing Lower body dressing   What is the patient wearing?: Pants, Socks, Shoes       Pants- Performed by helper: Thread/unthread right pants leg, Thread/unthread left pants leg, Pull pants up/down, Fasten/unfasten pants       Socks - Performed by helper: Don/doff right sock, Don/doff left sock   Shoes - Performed by helper: Don/doff right shoe, Don/doff left shoe, Fasten right, Fasten left          Lower body assist Assist for lower body dressing: 2 Helpers  Toileting Toileting Toileting activity did not occur: No continent bowel/bladder event   Toileting steps completed by helper: Adjust clothing prior to toileting, Performs perineal hygiene, Adjust clothing after toileting (pt performed hygiene but caregiver performed again for hygiene purposes)    Toileting assist Assist level: Two helpers   Transfers Chair/bed transfer   Chair/bed  transfer method: Lateral scoot Chair/bed transfer assist level: Total assist (Pt < 25%) Chair/bed transfer assistive device: Sliding board, Armrests     Locomotion Ambulation Ambulation activity did not occur: Safety/medical concerns         Wheelchair       Assist Level: Dependent (Pt equals 0%)  Cognition Comprehension Comprehension assist level: Understands basic 50 - 74% of the time/ requires cueing 25 - 49% of the time  Expression Expression assist level: Expresses basic 25 - 49% of the time/requires cueing 50 - 75% of the time. Uses single words/gestures.  Social Interaction Social Interaction assist level: Interacts appropriately 25 - 49% of time - Needs frequent redirection.  Problem Solving Problem solving assist level: Solves basic less than 25% of the time - needs direction nearly all the time or does not effectively solve problems and may need a restraint for safety  Memory Memory assist level: Recognizes or recalls 25 - 49% of the time/requires cueing 50 - 75% of the time   Medical Problem List and Plan: 1. Functional, mobility and cognitive/communication deficitssecondary to debility in setting of prior encephalopathy/tetraplegia  -continue CIR PT, OT,  SLP  -pt showing some improvements with activity tolerance. Certainly more cognitively alert and interactive 2. DVT Prophylaxis/Anticoagulation: Pharmaceutical: Lovenox 3. Pain Management: tylenol prn 4. Mood: LCSW to follow for evaluation when appropriate  -no changes 5. Neuropsych: This patient is notcapable of making decisions on herown behalf. 6. Skin/Wound Care: routine pressure relief measures, local care for rash 7. Fluids/Electrolytes/Nutrition: Continue Tube feeds 125 cc from 4 pm to 8 am. Water flushes 300 cc/ 5 X day.    -follow up labs on Monday  -replacing potassium 8. Spasticity: On baclofen qid and valium qid.  -continue valium for now--will reduce dose slightly. consider klonopin  re-trial  -consider dantrium trial   -resting left elbow splint per [facility] initially at 100 degrees   -wear after therapies/evening -splinting, dynamic left elbow, knee.  -ROM with therapy -botox injection performed 1/12 --beginning to see progress as pt initiating more use of her left arm and leg. Actually used left hand to rub her nose while I was in the room. Between therapy/splinting should see further gains while on inpatient rehab. Hope to improve transfers/basic mobility 9. Candida UTI/C diff colitis: tested + 12/27. has completed treatment.   -continue contact precautions 10 VDRF: now with cuffless #6 trach. Seems to be tolerating PMV better  -  nebulizers prn and mucinex to loosen secretions.     -pulmonary continues to follow   -continue nebs, chest pt, OOB, suction  -sputum cx with pseudomonas-observation only    11. ABLA  Hb 11.0 on 12/10  Cont to monitor    LOS (Days) 11 A FACE TO FACE EVALUATION WAS PERFORMED  Meredith Staggers, MD 02/14/2016 9:01 AM

## 2016-02-14 NOTE — Progress Notes (Signed)
Social Work Patient ID: Judy Spears, female   DOB: 09-Jun-1950, 66 y.o.   MRN: [id number]   LATE ENTRY: Met with pt's husband Thursday afternoon and reviewed team conference.  He is aware and agreeable with targeted d/c date of 1/31 and hope to reach max/total assist +1 level of care.  Alerted him that insurance considering medical review next week to continue coverage through targeted date.  Will continue to follow.  Christyanna Mckeon, LCSW

## 2016-02-14 NOTE — Progress Notes (Signed)
Occupational Therapy Session Note  Patient Details  Name: Judy Spears MRN: [id number] Date of Birth: 1950/06/08  Today's Date: 02/14/2016 OT Individual Time: [id number] OT Individual Time Calculation (min): 50 min    Short Term Goals: Week 2:  OT Short Term Goal 1 (Week 2): Pt will reach and grasp for grab bars of STEADY with min A and VCs in prep for functional transfers OT Short Term Goal 2 (Week 2): Pt will maintain midline sitting for 3 minutes with assist for hand support only and supervision in prep for functional sitting task OT Short Term Goal 3 (Week 2): Pt will complete transfer via STEADY with max- total A +1 in order to reduce caregiver burden  Skilled Therapeutic Interventions/Progress Updates:    partiicipation today as follows:  Pulse oxygen machine continually exhibited 02 saturation up and down (71-100) during session.  Patient educated on purse breathing each time it dropped below 90, until 02 reached at least 90 again  Patient was able to stand at sink (with somewhat kyphotic posture and bent knees) x 4 during this session with Max Assist x2.   As well, she was able to sit in her w/c unsupported with feet on floor for approximately 40 minutes during the session .      As well, she participated in [place] both shoulders.  Her Best Friend was present for entire session and supportive husband came in with about 10 minutes of the session remaining.  She was left seated in her w/c with w/c positiong belt in place and with supportive husband nearby at the end of the session.  Therapy Documentation Precautions:  Precautions Precautions: Fall Precaution Comments: Trach, PEG, supplemental O2, PMSV Required Braces or Orthoses: Other Brace/Splint Other Brace/Splint: Pt has B AFOs and UE splints Restrictions Weight Bearing Restrictions: No  Pain: Pain Assessment Pain Assessment: No/denies pain  See Function Navigator for Current Functional Status.   Therapy/Group:  Individual Therapy  Herschell Dimes 02/14/2016, 7:19 PM

## 2016-02-14 NOTE — Progress Notes (Signed)
Pt up in chair.  Hold CPT at this time

## 2016-02-15 ENCOUNTER — Inpatient Hospital Stay (HOSPITAL_COMMUNITY): Payer: BC Managed Care – PPO | Admitting: Physical Therapy

## 2016-02-15 NOTE — Progress Notes (Signed)
02/15/15 1030 nursing   Family requested to turn off pulse oximeter because it keeps on going off and patient is getting agitated with the noise. RN switched pulse oximeter 3x yesterday. It will work for sometime then it will go off. Sensor was changed as well.Per family  they will be staying with the patient anyway.

## 2016-02-15 NOTE — Progress Notes (Signed)
Physical Therapy Session Note  Patient Details  Name: Judy Spears MRN: [id number] Date of Birth: 1950/05/12  Today's Date: 02/15/2016 PT Individual Time: 1530-1615 PT Individual Time Calculation (min): 45 min   Short Term Goals: Week 2:  PT Short Term Goal 1 (Week 2): Pt will perform bed mobility maxA +1 PT Short Term Goal 2 (Week 2): Pt will perform dynamic sitting balance modA PT Short Term Goal 3 (Week 2): Pt will perform sit <>stand in stedy with consistent maxA +1 PT Short Term Goal 4 (Week 2): Pt will tolerate standing with lift eqiupment x10 min with stable vitals  Skilled Therapeutic Interventions/Progress Updates:   Patient received on [facility] via Stedy with husband present, handoff from [place]. Patient unable to void and performed sit <>stand using Stedy with mod-max A x 2 for clothing management and to transfer to wheelchair. Utilized Maximove to transfer wheelchair <> tilt table with +2A with patient assisting with bridging on tilt table to position sling. Patient tolerated standing in tilt table with RUE support on handle and manual facilitation to promote upright posture due to lateral trunk flexion to L progressing to 55 degrees x 25 minutes for BLE neuro re-ed, tone reduction, and forced use. Patient unable to achieve full B knee extension on tilt table. Patient brought up to 75 degrees but unable to maintain this position due to increasing L > R knee flexion in more upright position. Patient maintained on 4 L 02 via trach collar with vitals monitored intermittently with Sp02 99-100% and HR in the 80's. Patient returned to wheelchair and left in room with husband present and needs met.   Therapy Documentation Precautions:  Precautions Precautions: Fall Precaution Comments: Trach, PEG, supplemental O2, PMSV Required Braces or Orthoses: Other Brace/Splint Other Brace/Splint: Pt has B AFOs and UE splints Restrictions Weight Bearing Restrictions: No Vital Signs: Therapy  Vitals Temp: 98 F (36.7 C) Temp Source: Oral Pulse Rate: 99 Resp: 18 BP: 129/80 Patient Position (if appropriate): Lying Oxygen Therapy SpO2: 98 % O2 Device: Tracheostomy Collar O2 Flow Rate (L/min): 6 L/min FiO2 (%): 28 % Pain: Pain Assessment Pain Assessment: No/denies pain   See Function Navigator for Current Functional Status.   Therapy/Group: Individual Therapy  [name], Murray Hodgkins 02/15/2016, 4:18 PM

## 2016-02-15 NOTE — Progress Notes (Signed)
[facility] PHYSICAL MEDICINE & REHABILITATION     PROGRESS NOTE    Subjective/Complaints: Sleeping soundly upon my entry. No new issues overnight.  ROS: Unable to obtain due to cognitive/mental status issues. .      Objective: Vital Signs: Blood pressure (!) 168/58, pulse 85, temperature 98.1 F (36.7 C), temperature source Oral, resp. rate 18, height 5\' 4"  (1.626 m), weight 52 kg (114 lb 10.2 oz), last menstrual period 12/01/2010, SpO2 100 %. No results found. No results for input(s): WBC, HGB, HCT, PLT in the last 72 hours. No results for input(s): NA, K, CL, GLUCOSE, BUN, CREATININE, CALCIUM in the last 72 hours.  Invalid input(s): CO CBG (last 3)   Recent Labs  02/12/16 1132 02/12/16 1643  GLUCAP 111* 89    Wt Readings from Last 3 Encounters:  02/15/16 52 kg (114 lb 10.2 oz)  02/03/16 52.2 kg (115 lb)  01/04/16 56.6 kg (124 lb 12.5 oz)    Physical Exam:  Constitutional: She appears well-developed.   NAD. HENT: Normocephalicand atraumatic.  Eyes: No discharge, EOM appear intact Neck: #6 trach clean/area inatct.   Cardiovascular: RRR,  Respiratory: no rhonchi. Normal effort. sats maintained in 90's GI: Bowel sounds are normal. She exhibits no distension.  PEG site remains clean and dry. Musculoskeletal: She exhibits no edema, no tenderness.  Neurological: She is alert and making good eye contact Significant flexor tone LUE and LLE. LUE 1-2/4. LLE 3/4. Continued significant flexor tone in LUE and LLE.   -able to range left elbow to 100 deg, left knee to 155 deg--stable  Motor: 2-/5 RUE and RLE unchanged.  Skin: Skin is warmand dry. Macular rash on buttocks/sacrum nearly resolved Psychiatric: alert and engages  Assessment/Plan: 1. Functional, cognitive, and mobility deficits secondary to encephalopathy/debility which require 3+ hours per day of interdisciplinary therapy in a comprehensive inpatient rehab setting. Physiatrist is providing close team  supervision and 24 hour management of active medical problems listed below. Physiatrist and rehab team continue to assess barriers to discharge/monitor patient progress toward functional and medical goals.  Function:  Bathing Bathing position   Position: Bed (Night Bath)  Bathing parts   Body parts bathed by helper: Right arm, Right upper leg, Left arm, Left upper leg, Chest, Abdomen, Right lower leg, Front perineal area, Left lower leg, Buttocks, Back  Bathing assist Assist Level: 2 helpers      Upper Body Dressing/Undressing Upper body dressing   What is the patient wearing?: Bra, Pull over shirt/dress   Bra - Perfomed by helper: Thread/unthread right bra strap, Thread/unthread left bra strap, Hook/unhook bra (pull down sports bra)   Pull over shirt/dress - Perfomed by helper: Thread/unthread right sleeve, Thread/unthread left sleeve, Put head through opening, Pull shirt over trunk        Upper body assist Assist Level: 2 helpers      Lower Body Dressing/Undressing Lower body dressing   What is the patient wearing?: Pants, Socks, Shoes       Pants- Performed by helper: Thread/unthread right pants leg, Thread/unthread left pants leg, Pull pants up/down, Fasten/unfasten pants       Socks - Performed by helper: Don/doff right sock, Don/doff left sock   Shoes - Performed by helper: Don/doff right shoe, Don/doff left shoe, Fasten right, Fasten left          Lower body assist Assist for lower body dressing: 2 Helpers      Toileting Toileting Toileting activity did not occur: No continent bowel/bladder event  Toileting steps completed by helper: Adjust clothing prior to toileting, Performs perineal hygiene, Adjust clothing after toileting (pt performed hygiene but caregiver performed again for hygiene purposes)    Toileting assist Assist level: Two helpers   Transfers Chair/bed transfer   Chair/bed transfer method: Lateral scoot Chair/bed transfer assist level:  Total assist (Pt < 25%) Chair/bed transfer assistive device: Sliding board, Armrests     Locomotion Ambulation Ambulation activity did not occur: Safety/medical concerns         Wheelchair       Assist Level: Dependent (Pt equals 0%)  Cognition Comprehension Comprehension assist level: Understands basic 50 - 74% of the time/ requires cueing 25 - 49% of the time  Expression Expression assist level: Expresses basic 25 - 49% of the time/requires cueing 50 - 75% of the time. Uses single words/gestures.  Social Interaction Social Interaction assist level: Interacts appropriately 25 - 49% of time - Needs frequent redirection.  Problem Solving Problem solving assist level: Solves basic less than 25% of the time - needs direction nearly all the time or does not effectively solve problems and may need a restraint for safety  Memory Memory assist level: Recognizes or recalls 25 - 49% of the time/requires cueing 50 - 75% of the time   Medical Problem List and Plan: 1. Functional, mobility and cognitive/communication deficitssecondary to debility in setting of prior encephalopathy/tetraplegia  -continue CIR PT, OT,  SLP  -pt showing some improvements with activity tolerance. Certainly more cognitively alert and interactive 2. DVT Prophylaxis/Anticoagulation: Pharmaceutical: Lovenox 3. Pain Management: tylenol prn 4. Mood: LCSW to follow for evaluation when appropriate  -no changes 5. Neuropsych: This patient is notcapable of making decisions on herown behalf. 6. Skin/Wound Care: routine pressure relief measures, local care for rash 7. Fluids/Electrolytes/Nutrition: Continue Tube feeds 125 cc from 4 pm to 8 am. Water flushes 300 cc/ 5 X day.    -follow up labs on Monday  -replacing potassium 8. Spasticity: On baclofen qid and valium qid.  -continue valium for now--will reduce dose slightly. consider klonopin re-trial  -consider dantrium trial   -resting left elbow splint per  [facility] initially at 100 degrees   -wear after therapies/evening -splinting, dynamic left elbow, knee.  -ROM with therapy -botox injection performed 1/12 --beginning to see progress as pt initiating more use of her left arm and leg. Actually used left hand to rub her nose while I was in the room. Between therapy/splinting should see further gains while on inpatient rehab. Hope to improve transfers/basic mobility 9. Candida UTI/C diff colitis: tested + 12/27. has completed treatment on 01/31/16.   -continue contact precautions for c diff thru 03/01/16 if here 10 VDRF: now with cuffless #6 trach. Seems to be tolerating PMV better  -  nebulizers prn and mucinex to loosen secretions.     -pulmonary continues to follow   -continue nebs, chest pt, OOB, suction  -sputum cx with pseudomonas-observation only    11. ABLA  Hb 11.0 on 12/10  Cont to monitor    LOS (Days) 12 A FACE TO FACE EVALUATION WAS PERFORMED  Meredith Staggers, MD 02/15/2016 9:28 AM

## 2016-02-16 ENCOUNTER — Inpatient Hospital Stay (HOSPITAL_COMMUNITY): Payer: BC Managed Care – PPO | Admitting: Speech Pathology

## 2016-02-16 ENCOUNTER — Inpatient Hospital Stay (HOSPITAL_COMMUNITY): Payer: BC Managed Care – PPO | Admitting: Occupational Therapy

## 2016-02-16 ENCOUNTER — Inpatient Hospital Stay (HOSPITAL_COMMUNITY): Payer: BC Managed Care – PPO | Admitting: Physical Therapy

## 2016-02-16 LAB — BASIC METABOLIC PANEL
Anion gap: 10 (ref 5–15)
BUN: 10 mg/dL (ref 6–20)
CALCIUM: 9.8 mg/dL (ref 8.9–10.3)
CHLORIDE: 94 mmol/L — AB (ref 101–111)
CO2: 29 mmol/L (ref 22–32)
CREATININE: 0.32 mg/dL — AB (ref 0.44–1.00)
GFR calc non Af Amer: >60 mL/min (ref >60)
Glucose, Bld: 131 mg/dL — ABNORMAL HIGH (ref 65–99)
Potassium: 3.6 mmol/L (ref 3.5–5.1)
SODIUM: 133 mmol/L — AB (ref 135–145)

## 2016-02-16 LAB — CBC
HCT: 38.1 % (ref 36.0–46.0)
Hemoglobin: 12.5 g/dL (ref 12.0–15.0)
MCH: 30 pg (ref 26.0–34.0)
MCHC: 32.8 g/dL (ref 30.0–36.0)
MCV: 91.6 fL (ref 78.0–100.0)
PLATELETS: 322 10*3/uL (ref 150–400)
RBC: 4.16 MIL/uL (ref 3.87–5.11)
RDW: 16.5 % — AB (ref 11.5–15.5)
WBC: 5 10*3/uL (ref 4.0–10.5)

## 2016-02-16 MED ORDER — FREE WATER
200.0000 mL | Freq: Every day | Status: DC
  Administered 2016-02-16 – 2016-02-18 (×10): 200 mL

## 2016-02-16 MED ORDER — DIAZEPAM 2 MG PO TABS
1.0000 mg | ORAL_TABLET | Freq: Two times a day (BID) | ORAL | Status: DC
  Administered 2016-02-16 – 2016-02-17 (×3): 1 mg
  Filled 2016-02-16 (×5): qty 1

## 2016-02-16 NOTE — Progress Notes (Signed)
Occupational Therapy Session Note  Patient Details  Name: Judy Spears MRN: [id number] Date of Birth: 25-Jun-1950  Today's Date: 02/16/2016 OT Individual Time: [id number] and [id number] OT Individual Time Calculation (min): 60 min and 45 min   Short Term Goals: Week 2:  OT Short Term Goal 1 (Week 2): Pt will reach and grasp for grab bars of STEADY with min A and VCs in prep for functional transfers OT Short Term Goal 2 (Week 2): Pt will maintain midline sitting for 3 minutes with assist for hand support only and supervision in prep for functional sitting task OT Short Term Goal 3 (Week 2): Pt will complete transfer via STEADY with max- total A +1 in order to reduce caregiver burden  Skilled Therapeutic Interventions/Progress Updates:    Session One: Pt seen for OT session addressing dynamic sitting balance as well as core stability/ initiation for anterior weight shift in prep for transfers/ ADL task. Pt sitting up in w/c upon arrival, personal caregiver and husband present. Pt smiling and agreeable to tx session. Completed total A sliding board transfer to therapy mat with +2 to stabilize equipment. Seated EOM, worked on anterior weight shift, reaching to hold husband's hand and then reaching to move cups. Required verbal cuing to bring "nose over knees" to promote weight shift. With B UEs supported on table and grasping onto table edge, pt able to maintain balance with supervision, however, without grasping onto table pt with posterior LOB to L, requiring assist to return to midline. Required mod A at elbows to place on table and when reaching for cups due to decreased strength. Pt returned to chair in same manner as described above. With verbal/ visual cuing to reach forward during transfer pt able to initiate anterior weight shift to facilitate transfer. Pt left with caregivers at end of session.   Session Two: Pt seen for OT session focusing on fitting and education with Gerald Stabs from [employer] for  flexion blocking splint. Chris providing education to pt and therapist regarding use and wear schedule for brace. Information passed along to pt's personal caregiver [name]. Therapist will provide education and demonstration to pt's husband at next session when he is present. Pt appeared to tolerate brace well. Pt able to tolerate brace set at 20 degree flexion. Educated pt and caregiver regarding functional implications for brace and it's affect on mobility and ADLs.  Pt requesting return to bed at end of session. She required +2 assist to stand from w/c with min A to place UEs on support beam and mod A with VCs for anterior weightshift. Pt transitioned back into supine, left with caregiver present.   Therapy Documentation Precautions:  Precautions Precautions: Fall Precaution Comments: Trach, PEG, supplemental O2, PMSV Required Braces or Orthoses: Other Brace/Splint Other Brace/Splint: Pt has B AFOs and UE splints Restrictions Weight Bearing Restrictions: No  See Function Navigator for Current Functional Status.   Therapy/Group: Individual Therapy  [name], Valin Massie C 02/16/2016, 7:14 AM

## 2016-02-16 NOTE — Progress Notes (Signed)
Physical Therapy Session Note  Patient Details  Name: Judy Spears MRN: [id number] Date of Birth: [birth year]  Today's Date: 02/16/2016 PT Individual Time: 1100-1210 PT Individual Time Calculation (min): 70 min   Short Term Goals: Week 2:  PT Short Term Goal 1 (Week 2): Pt will perform bed mobility maxA +1 PT Short Term Goal 2 (Week 2): Pt will perform dynamic sitting balance modA PT Short Term Goal 3 (Week 2): Pt will perform sit <>stand in stedy with consistent maxA +1 PT Short Term Goal 4 (Week 2): Pt will tolerate standing with lift eqiupment x10 min with stable vitals  Skilled Therapeutic Interventions/Progress Updates: Pt received seated on BSC; +2 to complete dressing and hygiene after voiding. Performed sit >stand in stedy with maxA +1, second person to A with clothing management and hygiene. Improving standing tolerance with close S in stedy to clear hips from stedy seat and reduce burden of care. Standing frame x16 min with stable vitals throughout. Pt able to achieve near-neutral LE alignment (-5 degrees hip extension and knee extension). While standing pt engaged in cognitive tasks, pt attempting to spell out words to family by writing on table with pointer finger. Sit <>stand in stedy with maxA from w/c, min guard>S from stedy seat. Performs 3 sets 5 reps sit <>stand from stedy seat with close S; min guard. Cues for anterior weight shift to improve weight bearing and push through LEs. Transfer w/c >bed with transfer and maxA. Sit >supine +2A for energy conservation. Remained supine in bed at end of session, all needs in reach.      Therapy Documentation Precautions:  Precautions Precautions: Fall Precaution Comments: Trach, PEG, supplemental O2, PMSV Required Braces or Orthoses: Other Brace/Splint Other Brace/Splint: Pt has B AFOs and UE splints Restrictions Weight Bearing Restrictions: No   See Function Navigator for Current Functional Status.   Therapy/Group: Individual  Therapy  Luberta Mutter 02/16/2016, 12:57 PM

## 2016-02-16 NOTE — Progress Notes (Signed)
Patient is now on room air humidified. Tolerating well at this time. RT will monitor.

## 2016-02-16 NOTE — Progress Notes (Signed)
[facility] PHYSICAL MEDICINE & REHABILITATION     PROGRESS NOTE    Subjective/Complaints: Awake. Sitting up in bed. No problems overnight  ROS: Unable to obtain due to cognitive/mental status issues. .      Objective: Vital Signs: Blood pressure (!) 148/58, pulse 88, temperature 98 F (36.7 C), temperature source Axillary, resp. rate 16, height 5\' 4"  (1.626 m), weight 51 kg (112 lb 7 oz), last menstrual period 12/01/2010, SpO2 98 %. No results found.  Recent Labs  02/16/16 0803  WBC 5.0  HGB 12.5  HCT 38.1  PLT 322   No results for input(s): NA, K, CL, GLUCOSE, BUN, CREATININE, CALCIUM in the last 72 hours.  Invalid input(s): CO CBG (last 3)  No results for input(s): GLUCAP in the last 72 hours.  Wt Readings from Last 3 Encounters:  02/16/16 51 kg (112 lb 7 oz)  02/03/16 52.2 kg (115 lb)  01/04/16 56.6 kg (124 lb 12.5 oz)    Physical Exam:  Constitutional: She appears well-developed.   NAD. HENT: Normocephalicand atraumatic.  Eyes: No discharge, EOM appear intact Neck: #6 trach clean/area inatct.   Cardiovascular: RRR,  Respiratory: no rhonchi. Normal effort. sats maintained in 90's GI: Bowel sounds are normal. She exhibits no distension.  PEG site remains clean and dry. Musculoskeletal: She exhibits no edema, no tenderness.  Neurological: She is alert and making good eye contact Significant flexor tone LUE and LLE. LUE 1-2/4. LLE 3/4. Continued significant flexor tone in LUE and LLE.   -able to range left elbow to 100 deg, left knee to 155 deg--stable  Motor: 2-/5 RUE and RLE unchanged.  Skin: Skin is warmand dry. Macular rash on buttocks/sacrum nearly resolved Psychiatric: alert and engages  Assessment/Plan: 1. Functional, cognitive, and mobility deficits secondary to encephalopathy/debility which require 3+ hours per day of interdisciplinary therapy in a comprehensive inpatient rehab setting. Physiatrist is providing close team supervision and 24  hour management of active medical problems listed below. Physiatrist and rehab team continue to assess barriers to discharge/monitor patient progress toward functional and medical goals.  Function:  Bathing Bathing position   Position: Bed (Night Bath)  Bathing parts   Body parts bathed by helper: Right arm, Right upper leg, Left arm, Left upper leg, Chest, Abdomen, Right lower leg, Front perineal area, Left lower leg, Buttocks, Back  Bathing assist Assist Level: 2 helpers      Upper Body Dressing/Undressing Upper body dressing   What is the patient wearing?: Bra, Pull over shirt/dress   Bra - Perfomed by helper: Thread/unthread right bra strap, Thread/unthread left bra strap, Hook/unhook bra (pull down sports bra)   Pull over shirt/dress - Perfomed by helper: Thread/unthread right sleeve, Thread/unthread left sleeve, Put head through opening, Pull shirt over trunk        Upper body assist Assist Level: 2 helpers      Lower Body Dressing/Undressing Lower body dressing   What is the patient wearing?: Pants, Socks, Shoes       Pants- Performed by helper: Thread/unthread right pants leg, Thread/unthread left pants leg, Pull pants up/down, Fasten/unfasten pants       Socks - Performed by helper: Don/doff right sock, Don/doff left sock   Shoes - Performed by helper: Don/doff right shoe, Don/doff left shoe, Fasten right, Fasten left          Lower body assist Assist for lower body dressing: 2 Helpers      Toileting Toileting Toileting activity did not occur: No continent bowel/bladder  event   Toileting steps completed by helper: Adjust clothing prior to toileting, Adjust clothing after toileting (no bowel/bladder event)    Toileting assist Assist level: Two helpers   Transfers Chair/bed transfer   Chair/bed transfer method: Lateral scoot Chair/bed transfer assist level: Total assist (Pt < 25%) Chair/bed transfer assistive device: Sliding board, Armrests      Locomotion Ambulation Ambulation activity did not occur: Safety/medical concerns         Wheelchair   Type: Manual   Assist Level: Dependent (Pt equals 0%)  Cognition Comprehension Comprehension assist level: Understands basic 50 - 74% of the time/ requires cueing 25 - 49% of the time  Expression Expression assist level: Expresses basic 25 - 49% of the time/requires cueing 50 - 75% of the time. Uses single words/gestures.  Social Interaction Social Interaction assist level: Interacts appropriately 25 - 49% of time - Needs frequent redirection.  Problem Solving Problem solving assist level: Solves basic less than 25% of the time - needs direction nearly all the time or does not effectively solve problems and may need a restraint for safety  Memory Memory assist level: Recognizes or recalls 25 - 49% of the time/requires cueing 50 - 75% of the time   Medical Problem List and Plan: 1. Functional, mobility and cognitive/communication deficitssecondary to debility in setting of prior encephalopathy/tetraplegia  -continue CIR PT, OT,  SLP  -Pt is displaying substantial improvements in arousal and interaction with staff.  2. DVT Prophylaxis/Anticoagulation: Pharmaceutical: Lovenox 3. Pain Management: tylenol prn 4. Mood: LCSW to follow for evaluation when appropriate  -no changes 5. Neuropsych: This patient is notcapable of making decisions on herown behalf. 6. Skin/Wound Care: routine pressure relief measures, local care for rash 7. Fluids/Electrolytes/Nutrition: Continue Tube feeds 125 cc from 4 pm to 8 am. Water flushes 300 cc/ 5 X day.    -follow up labs pending today.   -replacing potassium 8. Spasticity: On baclofen qid and valium qid.  -reduce valium to 1mg  bid and 5mg  qhs . consider klonopin re-trial  -consider dantrium trial   -resting left elbow splint per [employer] initially at 100 degrees (to be delivered today)   -should wear after  therapies/evening -splinting, dynamic left elbow, knee.  -ROM with therapy -botox injection performed 1/12 --beginning to see progress as pt initiating more use of her left arm and leg. Actually used left hand to rub her nose while I was in the room. Between therapy/splinting should see further gains while on inpatient rehab. Hope to improve transfers/basic mobility 9. Candida UTI/C diff colitis: tested + 12/27. has completed treatment on 01/31/16.   -continue contact precautions for c diff thru 03/01/16 if here 10 VDRF: now with cuffless #6 trach. Seems to be tolerating PMV better  -  nebulizers prn and mucinex to loosen secretions.     -pulmonary continues to follow   -continue nebs, chest pt, OOB, suction  -sputum cx with pseudomonas-observation only    11. ABLA  Hb 11.0 on 12/10  Cont to monitor    LOS (Days) 13 A FACE TO FACE EVALUATION WAS PERFORMED  Meredith Staggers, MD 02/16/2016 9:08 AM

## 2016-02-16 NOTE — Progress Notes (Signed)
Speech Language Pathology Daily Session Note  Patient Details  Name: Judy Spears MRN: [id number] Date of Birth: [birth year]  Today's Date: 02/16/2016 SLP Individual Time: 1300-1345 SLP Individual Time Calculation (min): 45 min  Short Term Goals: Week 2: SLP Short Term Goal 1 (Week 2): Patient will demonstrate intelligibility at the word level 50% of the time with Max A multimodal cues for use of speech intelligibility strategies.  SLP Short Term Goal 2 (Week 2): Patient will demonstrate intelligibility at the phrase level 25% of the time with Max A multimodal cues for use of speech intelligibility strategies.  SLP Short Term Goal 3 (Week 2): Patient will perform diaphragmatic breathing exercises with Max A multimodal cues.  SLP Short Term Goal 4 (Week 2): Patient will utilize multimodal communication to express wants/needs with Max A multimodal cues.   Skilled Therapeutic Interventions: Skilled treatment session focused on addressing communication goals. Upon SLP arrival patient with PMSV in place and demonstrated good toleration throughout session.  Patient was transferred to wheelchair to optimize positioning for communication.  SLP also facilitated session by providing Max assist verbal cues to utilize big and loud at the single word level to express basic needs and wants.  Continue with current plan of care.   Function:  Cognition Comprehension Comprehension assist level: Understands basic 75 - 89% of the time/ requires cueing 10 - 24% of the time  Expression Expression assistive device: Talk trach valve Expression assist level: Expresses basic 25 - 49% of the time/requires cueing 50 - 75% of the time. Uses single words/gestures.  Social Interaction Social Interaction assist level: Interacts appropriately 25 - 49% of time - Needs frequent redirection.  Problem Solving Problem solving assist level: Solves basic less than 25% of the time - needs direction nearly all the time or does not  effectively solve problems and may need a restraint for safety  Memory Memory assist level: Recognizes or recalls 25 - 49% of the time/requires cueing 50 - 75% of the time    Pain Pain Assessment Pain Assessment: No/denies pain  Therapy/Group: Individual Therapy  Carmelia Roller., CCC-SLP [id number]  [place] 02/16/2016, 4:42 PM

## 2016-02-17 ENCOUNTER — Inpatient Hospital Stay (HOSPITAL_COMMUNITY): Payer: BC Managed Care – PPO | Admitting: Occupational Therapy

## 2016-02-17 ENCOUNTER — Inpatient Hospital Stay (HOSPITAL_COMMUNITY): Payer: BC Managed Care – PPO | Admitting: Speech Pathology

## 2016-02-17 ENCOUNTER — Inpatient Hospital Stay (HOSPITAL_COMMUNITY): Payer: BC Managed Care – PPO | Admitting: Physical Therapy

## 2016-02-17 NOTE — Progress Notes (Signed)
At appro 0615 ,while preparing for trach care this morning, patients pulse on the continuous pulse ox machine went up to 148. Oxygen sat went down to 84. Patient had spasms to BLE, more to the LLE. Her levels stayed elevated for a few seconds then, went back to WNL. Another nurse was called for assistance. This episode occurred 3 more times in a 45 minute time span. Theresa Mulligan was consulted. The highest the pulse went to was 168 & the lowest the oxygen went to was 78. Each time the patient was responding, alert & had the same spasms. Report given to oncoming nurse

## 2016-02-17 NOTE — Progress Notes (Signed)
Occupational Therapy Session Note  Patient Details  Name: Judy Spears MRN: [id number] Date of Birth: [birth year]  Today's Date: 02/17/2016 OT Individual Time: [id number] OT Individual Time Calculation (min): 75 min    Short Term Goals: Week 2:  OT Short Term Goal 1 (Week 2): Pt will reach and grasp for grab bars of STEADY with min A and VCs in prep for functional transfers OT Short Term Goal 2 (Week 2): Pt will maintain midline sitting for 3 minutes with assist for hand support only and supervision in prep for functional sitting task OT Short Term Goal 3 (Week 2): Pt will complete transfer via STEADY with max- total A +1 in order to reduce caregiver burden  Skilled Therapeutic Interventions/Progress Updates:    Pt seen for OT session focusing on sit <> stands and standing tolerance in the STEADY in order to reduce caregiver burden at d/c. Pt sitting EOB upon arrival with RN, personal care attendant, and husband present providing care.Pt agreeable to tx session.  Transfers completed throughout session with use of STEADY. Pt initiatially requiring +2 assist for sit> stand, however, with proper arm placement on STEADY and pt initiating anterior weight shift prior to stand, pt able to stand with max A +1 at end of session from standard chair.  Completed grooming tasks seated in STEADY at sink in front of mirror for visual feedback of midline sitting, pt with L lean during unsupported/ supported sitting. Min A required for support at elbow for movement of UE to complete hand hygiene at sink.  Pt taken to therapy day room to cont to work on standing. Pt completed multiple  Standing trials from STEADY, averaging ~20 seconds each trial of independent standing in steady (with B UE support). Educated pt and caregivers regarding purpose of task and functional implications of pt's ability to stand in STEADY without hands on assist from caregiver (i.e. Pt stands while caregiver completes toileting tasks) in  order to reduce caregiver burden.  Practiced sit> stand in STEADY from standard chair to simulate [facility] transfer. Worked with caregiver of ways to provide assist/ set pt up for most success/ reducing caregiver burden. Pt initially requiring +2 assist, however, last trial pt able to complete sit> stand into STEADY with +1 assist able to assist with standing and managing STEADY seat. Pt left seated in chair with caregivers present opting to wait in dayroom for PT session.  Pt on room air throughout session, no s/s of distress, O2 assessed at 97% during seated rest break.  Pt's husband desiring to have pt get shower tomorrow. Discussed with him reasons why we have not done that yet (i.e. Decreased oxygen support, tone, sitting balance, etc), however, feel that pt is now ready for task. Will attempt tomorrow. Reviewed pre-cautions of task with pt's caregiver and cautioned against attempting shower at home with proper precautions being taken, he voiced understanding.    Therapy Documentation Precautions:  Precautions Precautions: Fall Precaution Comments: Trach, PEG, supplemental O2, PMSV Required Braces or Orthoses: Other Brace/Splint Other Brace/Splint: Pt has B AFOs and UE splints Restrictions Weight Bearing Restrictions: No  See Function Navigator for Current Functional Status.   Therapy/Group: Individual Therapy  Spears, Judy Pamer C 02/17/2016, 7:03 AM

## 2016-02-17 NOTE — Progress Notes (Signed)
[facility] PHYSICAL MEDICINE & REHABILITATION     PROGRESS NOTE    Subjective/Complaints: Sitting on side of bed, leg hanging off. Doesn't indicate that she was trying to get out of bed  ROS limited by cognition/communicaiton .      Objective: Vital Signs: Blood pressure 140/70, pulse 84, temperature 98 F (36.7 C), temperature source Oral, resp. rate 18, height 5\' 4"  (1.626 m), weight 55 kg (121 lb 4.1 oz), last menstrual period 12/01/2010, SpO2 96 %. No results found.  Recent Labs  02/16/16 0803  WBC 5.0  HGB 12.5  HCT 38.1  PLT 322    Recent Labs  02/16/16 0803  NA 133*  K 3.6  CL 94*  GLUCOSE 131*  BUN 10  CREATININE 0.32*  CALCIUM 9.8   CBG (last 3)  No results for input(s): GLUCAP in the last 72 hours.  Wt Readings from Last 3 Encounters:  02/17/16 55 kg (121 lb 4.1 oz)  02/03/16 52.2 kg (115 lb)  01/04/16 56.6 kg (124 lb 12.5 oz)    Physical Exam:  Constitutional: She appears well-developed.   NAD. HENT: Normocephalicand atraumatic.  Eyes: No discharge, EOM appear intact Neck: #6 trach clean/area inatct.   Cardiovascular: RRR,  Respiratory: clear, no rhonchi GI: Bowel sounds are normal. She exhibits no distension.  PEG site remains clean and dry. Musculoskeletal: She exhibits no edema, no tenderness.  Neurological: She is alert and making good eye contact Significant flexor tone LUE and LLE. LUE 1-2/4. LLE 3/4. Continued significant flexor tone in LUE and LLE.   -elbow easily in spint at 110 deg.   -able to range to left knee to 160+  -tolerating PROM better  Motor: 3- to 3/5 RUE and RLE  2-3/5 LUE and LLE Skin: Skin is warmand dry. Macular rash on buttocks/sacrum nearly resolved Psychiatric: alert and engages  Assessment/Plan: 1. Functional, cognitive, and mobility deficits secondary to encephalopathy/debility which require 3+ hours per day of interdisciplinary therapy in a comprehensive inpatient rehab setting. Physiatrist is  providing close team supervision and 24 hour management of active medical problems listed below. Physiatrist and rehab team continue to assess barriers to discharge/monitor patient progress toward functional and medical goals.  Function:  Bathing Bathing position   Position: Bed (Night Bath)  Bathing parts   Body parts bathed by helper: Right arm, Right upper leg, Left arm, Left upper leg, Chest, Abdomen, Right lower leg, Front perineal area, Left lower leg, Buttocks, Back  Bathing assist Assist Level: 2 helpers      Upper Body Dressing/Undressing Upper body dressing   What is the patient wearing?: Bra, Pull over shirt/dress   Bra - Perfomed by helper: Thread/unthread right bra strap, Thread/unthread left bra strap, Hook/unhook bra (pull down sports bra)   Pull over shirt/dress - Perfomed by helper: Thread/unthread right sleeve, Thread/unthread left sleeve, Put head through opening, Pull shirt over trunk        Upper body assist Assist Level: 2 helpers      Lower Body Dressing/Undressing Lower body dressing   What is the patient wearing?: Pants, Socks, Shoes       Pants- Performed by helper: Thread/unthread right pants leg, Thread/unthread left pants leg, Pull pants up/down, Fasten/unfasten pants       Socks - Performed by helper: Don/doff right sock, Don/doff left sock   Shoes - Performed by helper: Don/doff right shoe, Don/doff left shoe, Fasten right, Fasten left          Lower body assist  Assist for lower body dressing: 2 Helpers      Naval architect activity did not occur: (P) No continent bowel/bladder event   Toileting steps completed by helper: Adjust clothing prior to toileting, Adjust clothing after toileting (no bowel/bladder event)    Toileting assist Assist level: (P) Two helpers   Transfers Chair/bed transfer   Chair/bed transfer method: Lateral scoot Chair/bed transfer assist level: Total assist (Pt < 25%) Chair/bed transfer  assistive device: Sliding board, Armrests     Locomotion Ambulation Ambulation activity did not occur: Safety/medical concerns         Wheelchair   Type: Manual   Assist Level: Dependent (Pt equals 0%)  Cognition Comprehension Comprehension assist level: Understands basic 75 - 89% of the time/ requires cueing 10 - 24% of the time  Expression Expression assist level: Expresses basic 25 - 49% of the time/requires cueing 50 - 75% of the time. Uses single words/gestures.  Social Interaction Social Interaction assist level: Interacts appropriately 25 - 49% of time - Needs frequent redirection.  Problem Solving Problem solving assist level: Solves basic less than 25% of the time - needs direction nearly all the time or does not effectively solve problems and may need a restraint for safety  Memory Memory assist level: Recognizes or recalls 25 - 49% of the time/requires cueing 50 - 75% of the time   Medical Problem List and Plan: 1. Functional, mobility and cognitive/communication deficitssecondary to debility in setting of prior encephalopathy/tetraplegia  -continue CIR PT, OT,  SLP  -pt continues to display improved interaction and intiation  -tone showing improvement after botox   2. DVT Prophylaxis/Anticoagulation: Pharmaceutical: Lovenox 3. Pain Management: tylenol prn 4. Mood: LCSW to follow for evaluation when appropriate  -no changes 5. Neuropsych: This patient is notcapable of making decisions on herown behalf. 6. Skin/Wound Care: routine pressure relief measures, local care for rash 7. Fluids/Electrolytes/Nutrition: Continue Tube feeds 125 cc from 4 pm to 8 am. Water flushes 300 cc/ 5 X day.    -follow up labs pending today.   -replacing potassium 8. Spasticity: On baclofen qid and valium.  -reduce valium to 1mg  bid and 5mg  qhs . consider klonopin re-trial  -consider dantrium trial   -resting left elbow splint per [facility] delivered initially at 100  degrees    -can push to 110 deg    -should wear after therapies/evening -splinting, dynamic left elbow, knee.  -ROM with therapy -botox injection performed 1/12 --beginning to see progress as pt initiating more use of her left arm and leg. Actually used left hand to rub her nose while I was in the room. Between therapy/splinting should see further gains while on inpatient rehab. Hope to improve transfers/basic mobility 9. Candida UTI/C diff colitis: tested + 12/27. has completed treatment on 01/31/16.   -continue contact precautions for c diff thru 03/01/16 if here 10 VDRF: now with cuffless #6 trach. Seems to be tolerating PMV better  -  nebulizers prn and mucinex to loosen secretions.     -pulmonary continues to follow   -continue nebs, chest pt, OOB, suction  -sputum cx with pseudomonas-observation only   -discussed with SLP---can attempt feeding trials 11. ABLA  Hb 11.0 on 12/10  Cont to monitor    LOS (Days) 14 A FACE TO FACE EVALUATION WAS PERFORMED  Meredith Staggers, MD 02/17/2016 9:17 AM

## 2016-02-17 NOTE — Progress Notes (Signed)
Spoke with Marlowe Sh[place], PA-C concerning patient's abdominal distention and was advised to run tube feed (Osmolite 1.5) at 29mL/hr for tonight only and MD with address in the AM. This was passed along to night shift RN.

## 2016-02-17 NOTE — Evaluation (Signed)
Speech Language Pathology Bedside Swallow Evaluation and Treatment Note  Patient Details  Name: Judy Spears MRN: [id number] Date of Birth: 07/04/1950  SLP Diagnosis: Dysphagia  Rehab Potential: Good ELOS: 1/31    Today's Date: 02/17/2016 SLP Individual Time: 1300-1400 SLP Individual Time Calculation (min): 60 min   Problem List:  Patient Active Problem List   Diagnosis Date Noted  . Acute on chronic respiratory failure with hypoxia ([place])   . Acute hypoxemic respiratory failure ([place])   . Acute blood loss anemia   . Atelectasis 02/04/2016  . Debility 02/03/2016  . Pneumonia of both lower lobes due to Pseudomonas species ([place])   . Pneumonia of both lower lobes due to methicillin susceptible Staphylococcus aureus (MSSA) ([place])   . Acute respiratory failure with hypoxia ([place])   . Acute encephalopathy   . Seizures ([place])   . Sepsis ([place])   . Hypoxia   . Pain   . HCAP (healthcare-associated pneumonia) 12/28/2015  . Chronic respiratory failure ([place]) 12/28/2015  . Acute tracheobronchitis 12/24/2015  . Tracheostomy tube present ([place])   . Tracheal stenosis   . Chronic respiratory failure with hypoxia ([place])   . 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 Tracheostomy status ([place]) 09/26/2015  . Allergic rhinitis 09/26/2015  . 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 Movement disorder 09/22/2015  . Encephalitis 09/22/2015  . Chest pain 02/07/2014  . Premature atrial contractions 01/13/2014  . Abnormality of gait 12/04/2013  . Hyperlipidemia 12/21/2011  . Cardiovascular risk factor 12/21/2011  . Bunion 01/31/2008  . Metatarsalgia of both feet 09/20/2007  . FLAT FOOT 09/20/2007  . HALLUX RIGIDUS, ACQUIRED 09/20/2007   Past Medical History:  Past Medical History:  Diagnosis Date  . Arthritis    lt great toe  . Complication of anesthesia    pt has had headaches post op-did advise to hydra well preop  . Hair loss    right-sided  . History of exercise stress test    a. ETT (12/15) with  12:00 exercise, no ST changes, occasional PACs.   . Hyperlipidemia   . Insomnia   . Migraine headache   . 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 Postmenopausal   . Premature atrial contractions    a. Holter (12/15) with 8% PACs, no atrial runs or atrial fibrillation.    Past Surgical History:  Past Surgical History:  Procedure Laterality Date  . BREAST BIOPSY  01/01/2011   Procedure: BREAST BIOPSY WITH NEEDLE LOCALIZATION;  Surgeon: Rolm Bookbinder, MD;  Location: [facility];  Service: General;  Laterality: Left;  left breast wire localization biopsy  . cataracts right eye    . CESAREAN SECTION  1986  . HERNIA REPAIR  2000   RIH  . opirectinal membrane peel    . RHINOPLASTY  1983    Assessment / Plan / Recommendation Clinical Impression Refer to Cognitive-Linguistic Evaluation for full History & Physical details.   Patient has been participating in therapies and making function gains, tolerating the PMSV during all waking hours, and only requires humidified room air; as a result, MD okayed initiation of a bedside swallow evaluation.  Patient with moderately- severe oral motor impairments with weakness and well as poor coordination with sensation difficult to assess.  SLP provided hand-over-hand assist for self feeding of ice chips, 1 at a time, x4 with timely initiation of mastication and delay in swallow response, averaging 10 seconds with wet vocal quality and inconsistent ability to clear or perform a second  swallow.  Following the 4 trials patient with a delayed, weak cough, and patient reported that she had had enough.  Trials were limited to only ice chips at this time.  Recommend ice chips trials with SLP only at this time.    Skilled Therapeutic Interventions          Skilled treatment focused on addressing communication goals. SLP facilitated session by providing Max assist verbal cues for patient to achieve 25% intelligibility at the word level and 10% at  the phrase level with use of speech intelligibility strategies. Patient also required Max assist verbal cues to utilize a letter board with a laser attached to her glasses to spell unknown words to SLP.  Patient was motivated to spell what the grandkids call her and her husband.  Patient left upright in wheelchair with caregiver present. Continue with current plan of care.     SLP Assessment  Patient will need skilled [facility] during CIR admission    Recommendations  SLP Diet Recommendations: NPO Patient destination: Home Follow up Recommendations: 24 hour supervision/assistance;Home Health SLP Equipment Recommended: To be determined    SLP Frequency 3 to 5 out of 7 days   SLP Duration  SLP Intensity  SLP Treatment/Interventions 1/31  Minumum of 1-2 x/day, 30 to 90 minutes  Dysphagia/aspiration precaution training;Therapeutic Exercise;Patient/family education;Functional tasks    Pain Pain Assessment Pain Assessment: No/denies pain  Prior Functioning    Function:  Eating Eating   Modified Consistency Diet: Yes (trials with SLP) Eating Assist Level: Hand over hand assist           Cognition Comprehension Comprehension assist level: Understands basic 75 - 89% of the time/ requires cueing 10 - 24% of the time  Expression Expression assistive device: Talk trach valve Expression assist level: Expresses basic 25 - 49% of the time/requires cueing 50 - 75% of the time. Uses single words/gestures.  Social Interaction Social Interaction assist level: Interacts appropriately 25 - 49% of time - Needs frequent redirection.  Problem Solving Problem solving assist level: Solves basic less than 25% of the time - needs direction nearly all the time or does not effectively solve problems and may need a restraint for safety  Memory Memory assist level: Recognizes or recalls 25 - 49% of the time/requires cueing 50 - 75% of the time   Short Term Goals: Week 2:  SLP Short Term Goal 1 (Week 2): Patient will demonstrate intelligibility at the word level 50% of the time with Max A multimodal cues for use of speech intelligibility strategies.  SLP Short Term Goal 2 (Week 2): Patient will demonstrate intelligibility at the phrase level 25% of the time with Max A multimodal cues for use of speech intelligibility strategies.  SLP Short Term Goal 3 (Week 2): Patient will perform diaphragmatic breathing exercises with Max A multimodal cues.  SLP Short Term Goal 4 (Week 2): Patient will utilize multimodal communication to express wants/needs with Max A multimodal cues.  SLP Short Term Goal 5 (Week 2): Patient will initiate swallow response of ice chip via spoon in less than 7 seconds with Max A multimodal cues with minimal overt s/s of aspiration.   Refer to Care Plan for Long Term Goals  Recommendations for other services: None   Discharge Criteria: Patient will be discharged from SLP if patient refuses treatment 3 consecutive times without medical reason, if treatment goals not met, if there is a change in medical status, if patient makes no progress towards  goals or if patient is discharged from hospital.  The above assessment, treatment plan, treatment alternatives and goals were discussed and mutually agreed upon: by patient and caregiver   Carmelia Roller., CCC-SLP [phone]  [place] 02/17/2016, 4:58 PM

## 2016-02-17 NOTE — Progress Notes (Signed)
Physical Therapy Session Note  Patient Details  Name: Judy Spears MRN: [id number] Date of Birth: Oct 02, 1950  Today's Date: 02/17/2016 PT Individual Time: 1100-1200 PT Individual Time Calculation (min): 60 min   Short Term Goals: Week 2:  PT Short Term Goal 1 (Week 2): Pt will perform bed mobility maxA +1 PT Short Term Goal 2 (Week 2): Pt will perform dynamic sitting balance modA PT Short Term Goal 3 (Week 2): Pt will perform sit <>stand in stedy with consistent maxA +1 PT Short Term Goal 4 (Week 2): Pt will tolerate standing with lift eqiupment x10 min with stable vitals  Skilled Therapeutic Interventions/Progress Updates: Pt received seated in regular chair in day room; denies pain and agreeable to treatment. Pt on room air throughout session; O2 dropped to 91% following standing frame, but recovered to >95% within 2 min rest break and remained 95% or higher for remainder of session. Sit >stand with stedy and maxA with caregiver [name] providing assist to prepare for d/c home. Requires additional help to stedy pt while managing stedy seat. Standing frame x18 min with pt performing quad sets, glute sets, mini squats AAROM. Extensive verbal/tactile cueing and facilitation for midline postural alignment. Sit <>stand x4 in parallel bars with maxA to boost to stand, mirror visual feedback for postural awareness, and min guard for static standing balance once in upright position. Pt able to maintain standing, although with significant L lateral lean >1 min at a time with min guard. Postural abnormalities including L lateral shift and forward trunk flexion difficult to manually correct. Pt demonstrating improving UE initiation and independence with reaching to prepare for sit>stand, and initiating trunk flexion with S to min A for anterior weight shift prior to standing. Pt with steady improvement towards goals, demonstrating motivation and exceptional effort in all session. Remained seated in w/c at end of  session with caregiver and husband present to transport back to room.      Therapy Documentation Precautions:  Precautions Precautions: Fall Precaution Comments: Trach, PEG, supplemental O2, PMSV Required Braces or Orthoses: Other Brace/Splint Other Brace/Splint: Pt has B AFOs and UE splints Restrictions Weight Bearing Restrictions: No   See Function Navigator for Current Functional Status.   Therapy/Group: Individual Therapy  Luberta Mutter 02/17/2016, 12:05 PM

## 2016-02-18 ENCOUNTER — Inpatient Hospital Stay (HOSPITAL_COMMUNITY): Payer: BC Managed Care – PPO

## 2016-02-18 ENCOUNTER — Telehealth: Payer: Self-pay | Admitting: Pulmonary Disease

## 2016-02-18 ENCOUNTER — Inpatient Hospital Stay (HOSPITAL_COMMUNITY): Payer: BC Managed Care – PPO | Admitting: Physical Therapy

## 2016-02-18 ENCOUNTER — Inpatient Hospital Stay (HOSPITAL_COMMUNITY): Payer: BC Managed Care – PPO | Admitting: Occupational Therapy

## 2016-02-18 ENCOUNTER — Inpatient Hospital Stay (HOSPITAL_COMMUNITY): Payer: BC Managed Care – PPO | Admitting: Speech Pathology

## 2016-02-18 MED ORDER — OSMOLITE 1.5 CAL PO LIQD
1000.0000 mL | ORAL | Status: DC
  Filled 2016-02-18 (×2): qty 1000

## 2016-02-18 MED ORDER — VITAL 1.5 CAL PO LIQD
1000.0000 mL | ORAL | Status: DC
  Administered 2016-02-18 – 2016-02-19 (×2): 1000 mL
  Filled 2016-02-18 (×7): qty 1000

## 2016-02-18 MED ORDER — FREE WATER
200.0000 mL | Freq: Every day | Status: DC
  Administered 2016-02-18 – 2016-02-21 (×13): 200 mL
  Administered 2016-02-21: 120 mL
  Administered 2016-02-21: 200 mL
  Administered 2016-02-21: 150 mL
  Administered 2016-02-22 (×2): 200 mL
  Administered 2016-02-22 (×2): 100 mL
  Administered 2016-02-23 (×2): 200 mL

## 2016-02-18 NOTE — Telephone Encounter (Signed)
Spoke with pt's spouse, states that pt is currently hospitalized at [facility]- is requesting that BQ visit patient in hospital to discuss trach.  Pt's husband has some concerns about the size of trach that pt is currently using, wishes to discuss this only with BQ.  I advised pt's spouse that BQ is off today, but that I would forward this message to him.  BQ please advise if you are able to visit patient in hospital to discuss trach with pt's spouse.  Thank you.

## 2016-02-18 NOTE — Progress Notes (Signed)
[facility] PHYSICAL MEDICINE & REHABILITATION     PROGRESS NOTE    Subjective/Complaints: Sitting up in bed. No new complaints. Appears comfortable.   ROS limited by cognition .      Objective: Vital Signs: Blood pressure 120/70, pulse 83, temperature 98.9 F (37.2 C), temperature source Axillary, resp. rate 20, height 5\' 4"  (1.626 m), weight 55.9 kg (123 lb 4.8 oz), last menstrual period 12/01/2010, SpO2 95 %. No results found.  Recent Labs  02/16/16 0803  WBC 5.0  HGB 12.5  HCT 38.1  PLT 322    Recent Labs  02/16/16 0803  NA 133*  K 3.6  CL 94*  GLUCOSE 131*  BUN 10  CREATININE 0.32*  CALCIUM 9.8   CBG (last 3)  No results for input(s): GLUCAP in the last 72 hours.  Wt Readings from Last 3 Encounters:  02/18/16 55.9 kg (123 lb 4.8 oz)  02/03/16 52.2 kg (115 lb)  01/04/16 56.6 kg (124 lb 12.5 oz)    Physical Exam:  Constitutional: She appears well-developed.   NAD. HENT: Normocephalicand atraumatic.  Eyes: No discharge, EOM appear intact Neck: #6 trach clean/area inatct.   Cardiovascular: RRR,  Respiratory: clear. Can move air when asked to GI: Bowel sounds are normal. She exhibits no distension.  PEG site remains clean and dry. Musculoskeletal: She exhibits no edema, no tenderness.  Neurological: She is alert and making good eye contact Significant flexor tone LUE and LLE. LUE 1-2/4. LLE 3/4. Continued significant flexor tone in LUE and LLE.   -elbow easily in spint at 115 deg.   -able to range to left knee to 170+  -tolerating PROM better  Motor: 3- to 3/5 RUE and RLE  2-3/5 LUE and LLE Skin: Skin is warmand dry. Macular rash on buttocks/sacrum nearly resolved Psychiatric: alert and engages  Assessment/Plan: 1. Functional, cognitive, and mobility deficits secondary to encephalopathy/debility which require 3+ hours per day of interdisciplinary therapy in a comprehensive inpatient rehab setting. Physiatrist is providing close team  supervision and 24 hour management of active medical problems listed below. Physiatrist and rehab team continue to assess barriers to discharge/monitor patient progress toward functional and medical goals.  Function:  Bathing Bathing position   Position: Bed (Night Bath)  Bathing parts   Body parts bathed by helper: Right arm, Right upper leg, Left arm, Left upper leg, Chest, Abdomen, Right lower leg, Front perineal area, Left lower leg, Buttocks, Back  Bathing assist Assist Level: 2 helpers      Upper Body Dressing/Undressing Upper body dressing   What is the patient wearing?: Bra, Pull over shirt/dress   Bra - Perfomed by helper: Thread/unthread right bra strap, Thread/unthread left bra strap, Hook/unhook bra (pull down sports bra)   Pull over shirt/dress - Perfomed by helper: Thread/unthread right sleeve, Thread/unthread left sleeve, Put head through opening, Pull shirt over trunk        Upper body assist Assist Level: 2 helpers      Lower Body Dressing/Undressing Lower body dressing   What is the patient wearing?: Pants, Socks, Shoes       Pants- Performed by helper: Thread/unthread right pants leg, Thread/unthread left pants leg, Pull pants up/down, Fasten/unfasten pants       Socks - Performed by helper: Don/doff right sock, Don/doff left sock   Shoes - Performed by helper: Don/doff right shoe, Don/doff left shoe, Fasten right, Fasten left          Lower body assist Assist for lower body dressing:  2 Helpers      Naval architect activity did not occur: No continent bowel/bladder event   Toileting steps completed by helper: Adjust clothing prior to toileting, Adjust clothing after toileting    Toileting assist Assist level: Two helpers   Transfers Chair/bed transfer   Chair/bed transfer method: Lateral scoot Chair/bed transfer assist level: Total assist (Pt < 25%) Chair/bed transfer assistive device: Sliding board, Armrests      Locomotion Ambulation Ambulation activity did not occur: Safety/medical concerns         Wheelchair   Type: Manual   Assist Level: Dependent (Pt equals 0%)  Cognition Comprehension Comprehension assist level: Understands basic 75 - 89% of the time/ requires cueing 10 - 24% of the time  Expression Expression assist level: Expresses basic 25 - 49% of the time/requires cueing 50 - 75% of the time. Uses single words/gestures.  Social Interaction Social Interaction assist level: Interacts appropriately 25 - 49% of time - Needs frequent redirection.  Problem Solving Problem solving assist level: Solves basic less than 25% of the time - needs direction nearly all the time or does not effectively solve problems and may need a restraint for safety  Memory Memory assist level: Recognizes or recalls 25 - 49% of the time/requires cueing 50 - 75% of the time   Medical Problem List and Plan: 1. Functional, mobility and cognitive/communication deficitssecondary to debility in setting of prior encephalopathy/tetraplegia  -continue CIR PT, OT,  SLP  -pt continues to display improved interaction and intiation  -tone with improvement after botox . Pt displaying more tolerance of transfers/mobility also  2. DVT Prophylaxis/Anticoagulation: Pharmaceutical: Lovenox 3. Pain Management: tylenol prn 4. Mood: LCSW to follow for evaluation when appropriate  -no changes 5. Neuropsych: This patient is notcapable of making decisions on herown behalf. 6. Skin/Wound Care: routine pressure relief measures, local care for rash 7. Fluids/Electrolytes/Nutrition: Continue Tube feeds 125 cc from 4 pm to 8 am. Water flushes 300 cc/ 5 X day.    -follow up labs reviewed 8. Spasticity: On baclofen qid and valium.  -dc day time valium. Continue valium 5mg  qhs . consider klonopin re-trial  -consider dantrium trial   -resting left elbow splint per [facility] delivered initially at 100 degrees    -can push to  110 deg    -should wear after therapies/evening -splinting, dynamic left elbow, knee.  -ROM with therapy -botox injection performed 1/12 --beginning to see progress as pt initiating more use of her left arm and leg. Actually used left hand to rub her nose while I was in the room. Between therapy/splinting should see further gains while on inpatient rehab. Hope to improve transfers/basic mobility  -consider resting knee brace LLE 9. Candida UTI/C diff colitis: tested + 12/27. has completed treatment on 01/31/16.   -continue contact precautions for c diff thru 03/01/16 if here 10 VDRF: now with cuffless #6 trach.  tolerating PMV better  -  nebulizers prn and mucinex to loosen secretions.     -pulmonary continues to follow   -continue nebs, chest pt, OOB, suction  -sputum cx with pseudomonas-observation only   -reviewed trach question with pulm/ccs. They think she is too high risk to move to a #4 trach. Can stop continuous pulse ox checks.  11. ABLA  Hb 11.0 on 12/10  Cont to monitor    LOS (Days) 15 A FACE TO FACE EVALUATION WAS PERFORMED  Meredith Staggers, MD 02/18/2016 9:13 AM

## 2016-02-18 NOTE — Telephone Encounter (Signed)
Patient's husband returned call, CB is [phone].

## 2016-02-18 NOTE — Progress Notes (Signed)
Occupational Therapy Weekly Progress Note  Patient Details  Name: Judy Spears MRN: [id number] Date of Birth: [birth year]  Beginning of progress report period: February 11, 2016 End of progress report period: February 18, 2015  Today's Date: 02/18/2016 OT Individual Time: 1030-1200 OT Individual Time Calculation (min): 90 min    Patient has met 2 of 3 short term goals.  Pt making steady progress towards OT goals. Pt much more alert and active this week, verbalizing basic needs at 1-2 word level with increased time. Pt with able to assist more with transfers utilizing STEADY, pulling with UEs and using core to initiate anterior lean with min-mod A and verbal/ visual cuing. Pt beginning to be able to complete functional transfers with max- total A +1 using STEADY, progressing towards PLOF.  Pt would cont to benefit from CIR services and intensity to cont to make functional gains and reduce caregiver burden. Pt's husband and personal care attendant have been present for all sessions and assisting with hands on care.   Patient continues to demonstrate the following deficits: abnormal posture, acute pain, ataxia, cognitive deficits, disturbance of vision, muscular wasting and disuse atrophy, muscle weakness (generalized), pain in joint and progressive muscular atrophy:and therefore will continue to benefit from skilled OT intervention to enhance overall performance with Reduce care partner burden.  Patient progressing toward long term goals..  Continue plan of care.  OT Short Term Goals Week 2:  OT Short Term Goal 1 (Week 2): Pt will reach and grasp for grab bars of STEADY with min A and VCs in prep for functional transfers OT Short Term Goal 1 - Progress (Week 2): Met OT Short Term Goal 2 (Week 2): Pt will maintain midline sitting for 3 minutes with assist for hand support only and supervision in prep for functional sitting task OT Short Term Goal 2 - Progress (Week 2): Progressing toward goal OT  Short Term Goal 3 (Week 2): Pt will complete transfer via STEADY with max- total A +1 in order to reduce caregiver burden OT Short Term Goal 3 - Progress (Week 2): Met Week 3:  OT Short Term Goal 1 (Week 3): STG=LTG due to LOS  Skilled Therapeutic Interventions/Progress Updates:    Pt seen for OT ADL bathing/dressing session. Pt sitting EOB with caregivers present upon arrival, pt smiling and nodding eagerly when asked if she wanted to get into shower.  STEADY used throughout session, with pt being able to complete stand into STEADY and complete transfer with assist +1, improvements from previous sessions. Roll in shower chair with QRB around waist for showering method, +1-2 total A for shower, pt able to grasp onto hand held shower head and washcloth, however, not at functional level. She dressed with total A in w/c. 1 person able to complete full LB dressing task sit <> stand in STEADY. Pt able to tolerate standing in STEADY long enough for caregiver to pull pants up. And complete transfer back to w/c.  Pt sat in STEADY while caregiver blow dried hair, worked on pt Tax inspector for visual feedback for midline orientation and tactile cuing for support/ symmetrical sitting. Worked on pt's sitting balance in STEADY with UEs supported and ABducted by therapist and caregiver. Following extended period in midline sitting with UE support, pt able to lower UEs into lap and maintain midine balance.  Pt returned to w/c at end of session, left seated in w/c with caregiver present.  Throughout session educated regarding safety measures taken, and d/c planning.  Therapy Documentation Precautions:  Precautions Precautions: Fall Precaution Comments: Trach, PEG, supplemental O2, PMSV Required Braces or Orthoses: Other Brace/Splint Other Brace/Splint: Pt has B AFOs and UE splints Restrictions Weight Bearing Restrictions: No  See Function Navigator for Current Functional Status.   Therapy/Group: Individual  Therapy  [name], Lealand Elting C 02/18/2016, 7:15 AM

## 2016-02-18 NOTE — Patient Care Conference (Signed)
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 Acute on chronic respiratory failure with hypoxia ([place])   . Acute hypoxemic respiratory failure ([place])   . Acute blood loss anemia   . Atelectasis 02/04/2016  . Debility 02/03/2016  . Pneumonia of both lower lobes due to Pseudomonas species ([place])   . Pneumonia of both lower lobes due to methicillin susceptible Staphylococcus aureus (MSSA) ([place])   . Acute respiratory failure with hypoxia ([place])   . Acute encephalopathy   . Seizures ([place])   . Sepsis ([place])   . Hypoxia   . Pain   . HCAP (healthcare-associated pneumonia) 12/28/2015  . Chronic respiratory failure ([place]) 12/28/2015  . Acute tracheobronchitis 12/24/2015  . Tracheostomy tube present ([place])   . Tracheal stenosis   . Chronic respiratory failure with hypoxia ([place])   . 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[place]) 10/20/2015  . Tracheostomy status ([place]) 09/26/2015  . Allergic rhinitis 09/26/2015  . 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 Movement disorder 09/22/2015  . Encephalitis 09/22/2015  . Chest pain 02/07/2014  . Premature atrial contractions 01/13/2014  . Abnormality of gait 12/04/2013  . Hyperlipidemia 12/21/2011  . Cardiovascular risk factor 12/21/2011  . Bunion 01/31/2008  . Metatarsalgia of both feet 09/20/2007  . FLAT FOOT 09/20/2007  . HALLUX RIGIDUS, ACQUIRED 09/20/2007    Expected Discharge Date: Expected Discharge Date:  02/25/16  Team Members Present: Physician leading conference: Dr. Alger Simons Social Worker Present: Lennart Pall, LCSW Nurse Present: Dorien Ch[place]uahua, RN PT Present: Kem Parkinson, Rachel Moulds, PT OT Present: Napoleon Form, OT SLP Present: Gunnar Fusi, SLP PPS Coordinator present : Daiva Nakayama, RN, CRRN     Current Status/Progress Goal Weekly Team Focus  Medical   displaying improved arousal. weaning sedating meds. responding to botox. has resting left elbow splint  see prior  arousal, tone mgt, preservation and improvement of ROM, adjustment of TF   Bowel/Bladder   Continent at times. Up to [facility], timed toileting, otherwise incontinent of B/B LBM 02/17/16  Maintain B/B continence with max assistance.  Timed toileting q2hr and PRN, changed brief PRN, Up to [facility] or toilet in bathroom as often as possible   Swallow/Nutrition/ Hydration   NPO with PEG and ice chips with SLP  participation in dysphagia therapy with Max A  cont ice chip trials with SLP and toelration    ADL's   Max- total A +1-2 depending on fatigue/ tone  max- total A +1 overall  Neuro re-ed, use of STEADY in prep for d/c use, standing tolerance, UE ROM, caregiver training   Mobility   maxA bed mobility, maxA sit <>stand in stedy, +2 for functional task in standing, S/minA dynamic sitting balance; steady progress towards goals  MaxA bed mobility, maxA transfers w/c <>bed, S dynamic sitting balance  sit <>stand in stedy, LE ROM and strength, dynamic sitting balance, family training, w/c assessment upcoming   Communication   #  6 trach with tolerance of PMSV, Max A for communication   Max A  continue PMSV tolerance, speech intelligibility at the word level, and functional communication    Safety/Cognition/ Behavioral Observations            Pain   Denies pain, Scheduled baclofen for muscle spasms. PRN tylenol for pain  Maintain comfortable pain level on a scale of 0-10 with a goal of less than 3.  assess pain q 4hr and  prn   Skin   MASD to buttocks with Gerherdts cream and Nystatin powder to bottom  Prevent further and new skin break down.  Assess skin q shift and PRN    Rehab Goals Patient on target to meet rehab goals: Yes *See Care Plan and progress notes for long and short-term goals.  Barriers to Discharge: ongoing neuro deficits    Possible Resolutions to Barriers:  continued engagement of team and family. orthotic adjustments, mobilizing into standing/transfer devices    Discharge Planning/Teaching Needs:  Plan home with husband and 24/7 caregivers  ongoing   Team Discussion:  botox showing some signs of working. Splints added yesterday for elbow.  Trial of ice chips today:  ONLY TO BE DONE WITH ST AT THIS POINT.   +1 person total assist today and caregiver beginning hands on.  Planning to shower tomorrow.  Continue to work in standing frame and work toward consistent +1 assist.  Revisions to Treatment Plan:  None   Continued Need for Acute Rehabilitation Level of Care: The patient requires daily medical management by a physician with specialized training in physical medicine and rehabilitation for the following conditions: Daily direction of a multidisciplinary physical rehabilitation program to ensure safe treatment while eliciting the highest outcome that is of practical value to the patient.: Yes Daily medical management of patient stability for increased activity during participation in an intensive rehabilitation regime.: Yes Daily analysis of laboratory values and/or radiology reports with any subsequent need for medication adjustment of medical intervention for : Neurological problems;Pulmonary problems;Nutritional problems  Pat Sires 02/18/2016, 2:18 PM

## 2016-02-18 NOTE — Progress Notes (Addendum)
Physical Therapy Weekly Progress Note  Patient Details  Name: Judy Spears MRN: [id number] Date of Birth: [birth year]  Beginning of progress report period: February 11, 2016 End of progress report period: February 18, 2016  Today's Date: 02/18/2016 PT Individual Time: 1300-1400 PT Individual Time Calculation (min): 60 min   Patient has met 4 of 4 short term goals.  Pt with steady improvements in core, LE/UE ROM, strength, coordination and functional mobility. Pt's husband and caregiver present for sessions for ongoing education and preparation for d/c. Pt continues to require +2 for functional tasks in the morning when spasticity and tone is worse, however improving to 1 person assist later in the day for tasks such as toileting as standing strength and tolerance improves. Pt would continue to benefit from IP rehab therapy services to improve functional mobility, reduce caregiver burden and continue progress towards long term goals.   Patient continues to demonstrate the following deficits muscle weakness and muscle joint tightness, decreased cardiorespiratoy endurance, impaired timing and sequencing, abnormal tone, unbalanced muscle activation, ataxia, decreased coordination and decreased motor planning and decreased sitting balance, decreased standing balance, decreased postural control and decreased balance strategies and therefore will continue to benefit from skilled PT intervention to increase functional independence with mobility.  Patient progressing toward long term goals..  Continue plan of care.  PT Short Term Goals Week 2:  PT Short Term Goal 1 (Week 2): Pt will perform bed mobility maxA +1 PT Short Term Goal 1 - Progress (Week 2): Met PT Short Term Goal 2 (Week 2): Pt will perform dynamic sitting balance modA PT Short Term Goal 2 - Progress (Week 2): Met PT Short Term Goal 3 (Week 2): Pt will perform sit <>stand in stedy with consistent maxA +1 PT Short Term Goal 3 - Progress (Week  2): Met PT Short Term Goal 4 (Week 2): Pt will tolerate standing with lift eqiupment x10 min with stable vitals PT Short Term Goal 4 - Progress (Week 2): Met Week 3:  PT Short Term Goal 1 (Week 3): =LTG due to estimated length of stay  Skilled Therapeutic Interventions/Progress Updates: Pt received seated in w/c, denies pain and agreeable to treatment. Session with focus on seating/positioning assessment with pressure mapping system and Josh from [employer] present with various cushions to trial. Note improved (reduced) peak pressure at bilateral ITs in Acta Embrace with anti-thrust, throughout tilt angle 0-45 degrees for pressure relief compared to Matrix cushion pt had been using previously. Note improved immersion, envelopment in Acta Embrace for increased pressure distribution appropriate. Observed significant improvement in postural alignment, reduced sacral sitting and posterior pelvic tilt, as well as reduced truncal extensor tone allowing for improved alignment to the back of cushion, leading to improved alignment of back rest lateral trunk supports. Of note, pt also has Roho cushion which she received when she obtained w/c; family and caregiver report cushion is not functional for transfers, as well as it does not provide appropriate positioning for L lateral trunk lean and increased weight bearing through L side.  To transfer in/out of w/c for testing cushions, performed sit <>stand in stedy between trials to replace cushions; mod/maxA for sit >stand, improved when anterior weight shift with upper body facilitated by therapist. Once in stedy seat, sit >stand with close S/min guard with verbal/tactile cueing for hip extension and able to maintain standing approximately 30 sec. Min cues for R lateral lean, R trunk shortening and cervical lateral flexion to R to correct to midline; reduced cueing  needed to align in neutral. Remained seated in w/c at end of session, all needs in reach.      Therapy  Documentation Precautions:  Precautions Precautions: Fall Precaution Comments: Trach, PEG, supplemental O2, PMSV Required Braces or Orthoses: Other Brace/Splint Other Brace/Splint: Pt has B AFOs and UE splints Restrictions Weight Bearing Restrictions: No   See Function Navigator for Current Functional Status.  Therapy/Group: Individual Therapy  Luberta Mutter 02/18/2016, 3:36 PM

## 2016-02-18 NOTE — Progress Notes (Signed)
Pt placed back on room air trach collar at this time per family request.  Pt tolerating well at this time.  RT will continue to monitor.

## 2016-02-18 NOTE — Telephone Encounter (Signed)
lmtcb X1 for pt's spouse  

## 2016-02-18 NOTE — Progress Notes (Signed)
Nutrition Follow-up  DOCUMENTATION CODES:   Not applicable  INTERVENTION:  Discontinue Osmolite 1.5 formula.   Provide nocturnal tube feeds of Vital 1.5 formula via PEG at goal rate of 115 ml/hr x 13 hours (6pm-7am) which provides 2242 kcal, 101 grams of protein, and 1136 ml of free water.   Continue free water flushes of 200 ml given 5 times daily. Total free water: 2136 ml.  RD to continue to monitor.   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 Hospital course significant for vent dependence with need for recanalization of trach and decline in MS due to refractory seizures despite multiple AEDs. She was transferred to [facility] on 12/10 for management and monitoring. 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 She was weaned to [facility] on 01/1 and tolerating PMSV.  RD consulted to modify tube feeds as pt has been experiencing abdominal distention. Husband report pt has been vomiting nightly. Plans to change tube feeding formula to Vital 1.5 formula as it a more semi-elemental formula thus aid in GI tolerance. Weight has been fluctuating however stable. Husband additionally reports he would like tube feeding infusion time reduced. RD to  modify orders and will continue to monitor.   Nutrition-Focused physical exam completed. Findings are no fat depletion, moderate to severe muscle depletion, and no edema. Depletion likely related to disease/illness progression as energy intake has been adequate.   Labs and medications reviewed.   Diet Order:  Diet NPO time specified  Skin:  Reviewed, no issues  Last BM:  1/23  Height:   Ht Readings from Last 1 Encounters:  02/03/16 _0  (1.626 m)   Weight:   Wt Readings from Last 1 Encounters:  02/18/16 123 lb 4.8 oz (55.9 kg)    Ideal Body Weight:  54.5 kg  BMI:  Body mass index is 21.16 kg/m.  Estimated Nutritional Needs:   Kcal:  2000-2200  Protein:  95-115 grams minimum  Fluid:  2-2.1 L/day  EDUCATION NEEDS:   No education needs identified at this time  Corrin Parker, MS, RD, LDN Pager # [phone] After hours/ weekend pager # [phone]

## 2016-02-18 NOTE — Progress Notes (Signed)
Speech Language Pathology Daily Session Note  Patient Details  Name: SHREENA EILAND MRN: [id number] Date of Birth: 1951-01-21  Today's Date: 02/18/2016 SLP Individual Time: 0900-0930 SLP Individual Time Calculation (min): 30 min  Short Term Goals: Week 2: SLP Short Term Goal 1 (Week 2): Patient will demonstrate intelligibility at the word level 50% of the time with Max A multimodal cues for use of speech intelligibility strategies.  SLP Short Term Goal 2 (Week 2): Patient will demonstrate intelligibility at the phrase level 25% of the time with Max A multimodal cues for use of speech intelligibility strategies.  SLP Short Term Goal 3 (Week 2): Patient will perform diaphragmatic breathing exercises with Max A multimodal cues.  SLP Short Term Goal 4 (Week 2): Patient will utilize multimodal communication to express wants/needs with Max A multimodal cues.  SLP Short Term Goal 5 (Week 2): Patient will initiate swallow response of ice chip via spoon in less than 7 seconds with Max A multimodal cues with minimal overt s/s of aspiration.   Skilled Therapeutic Interventions: Skilled treatment session focused on speech goals. Upon arrival, patient was awake while supine in bed. SLP donned PMSV and provided Max A multimodal cues for patient to achieve ~25% intelligibility at the word level and 10% intelligibility at the phrase level. SLP also spent extensive time on oral care this session. Patient left upright in bed with all needs within reach and caregivers present. Continue with current plan of care.      Function:  Cognition Comprehension Comprehension assist level: Understands basic 75 - 89% of the time/ requires cueing 10 - 24% of the time  Expression   Expression assist level: Expresses basic 25 - 49% of the time/requires cueing 50 - 75% of the time. Uses single words/gestures.  Social Interaction Social Interaction assist level: Interacts appropriately 25 - 49% of time - Needs frequent  redirection.  Problem Solving Problem solving assist level: Solves basic less than 25% of the time - needs direction nearly all the time or does not effectively solve problems and may need a restraint for safety  Memory Memory assist level: Recognizes or recalls 25 - 49% of the time/requires cueing 50 - 75% of the time    Pain No reports of pain   Therapy/Group: Individual Therapy  Kimball Manske 02/18/2016, 4:09 PM

## 2016-02-18 NOTE — Progress Notes (Signed)
Chest vest not done today due to patient being busy in therapy.  RT attempted this afternoon but has has been vomiting. Family agrees with holding off on chest vest for now.  RT will continue to monitor.

## 2016-02-18 NOTE — Progress Notes (Signed)
Physical Therapy Note  Patient Details  Name: Judy Spears MRN: [id number] Date of Birth: 06/02/1950 Today's Date: 02/18/2016    Attempted to see pt, however, nursing issuing meds and requests to r/s this session due to pt awaiting abdominal x-ray.  Missed 30 minutes of therapy.  Husna Krone Hilario Quarry 02/18/2016, 10:05 AM

## 2016-02-19 ENCOUNTER — Inpatient Hospital Stay (HOSPITAL_COMMUNITY): Payer: BC Managed Care – PPO | Admitting: Occupational Therapy

## 2016-02-19 ENCOUNTER — Inpatient Hospital Stay (HOSPITAL_COMMUNITY): Payer: BC Managed Care – PPO | Admitting: Physical Therapy

## 2016-02-19 ENCOUNTER — Inpatient Hospital Stay (HOSPITAL_COMMUNITY): Payer: BC Managed Care – PPO

## 2016-02-19 ENCOUNTER — Inpatient Hospital Stay (HOSPITAL_COMMUNITY): Payer: BC Managed Care – PPO | Admitting: Speech Pathology

## 2016-02-19 MED ORDER — DIAZEPAM 2 MG PO TABS
4.0000 mg | ORAL_TABLET | Freq: Every day | ORAL | Status: DC
  Administered 2016-02-19 – 2016-02-23 (×5): 4 mg
  Filled 2016-02-19 (×5): qty 2

## 2016-02-19 MED ORDER — CLONAZEPAM 0.5 MG PO TABS
0.2500 mg | ORAL_TABLET | Freq: Two times a day (BID) | ORAL | Status: DC
  Administered 2016-02-19: 0.25 mg via ORAL
  Filled 2016-02-19: qty 1

## 2016-02-19 MED ORDER — VITAL 1.5 CAL PO LIQD
1000.0000 mL | ORAL | Status: DC
  Administered 2016-02-19 – 2016-02-24 (×11): 1000 mL
  Filled 2016-02-19 (×21): qty 1000

## 2016-02-19 MED ORDER — SIMETHICONE 40 MG/0.6ML PO SUSP
40.0000 mg | Freq: Four times a day (QID) | ORAL | Status: DC
  Administered 2016-02-19 – 2016-02-25 (×24): 40 mg
  Filled 2016-02-19 (×26): qty 0.6

## 2016-02-19 NOTE — Progress Notes (Signed)
[facility] PHYSICAL MEDICINE & REHABILITATION     PROGRESS NOTE    Subjective/Complaints: Alert in bed. No distress. No issues overnight  ROS: Limited due cognitive/behavioral .      Objective: Vital Signs: Blood pressure 138/80, pulse 79, temperature 97.6 F (36.4 C), temperature source Oral, resp. rate 18, height 5\' 4"  (1.626 m), weight 54.4 kg (120 lb), last menstrual period 12/01/2010, SpO2 95 %. Dg Abd Portable 1v  Result Date: 02/18/2016 CLINICAL DATA:  Abdominal distension EXAM: PORTABLE ABDOMEN - 1 VIEW COMPARISON:  None. FINDINGS: Scattered large and small bowel gas is noted. No obstructive changes are seen. A gastrostomy catheter is noted in the upper abdomen. Mild degenerative changes of lumbar spine are seen. IMPRESSION: No acute abnormality noted. Electronically Signed   By: Inez Catalina M.D.   On: 02/18/2016 15:40   No results for input(s): WBC, HGB, HCT, PLT in the last 72 hours. No results for input(s): NA, K, CL, GLUCOSE, BUN, CREATININE, CALCIUM in the last 72 hours.  Invalid input(s): CO CBG (last 3)  No results for input(s): GLUCAP in the last 72 hours.  Wt Readings from Last 3 Encounters:  02/19/16 54.4 kg (120 lb)  02/03/16 52.2 kg (115 lb)  01/04/16 56.6 kg (124 lb 12.5 oz)    Physical Exam:  Constitutional: She appears well-developed.   NAD. HENT: Normocephalicand atraumatic.  Eyes: No discharge, EOM appear intact Neck: #6 trach clean/area inatct.   Cardiovascular: RRR, good air movement Respiratory: clear. Can move air when asked to GI: Bowel sounds are normal. She exhibits no distension.  PEG site remains clean and dry. Musculoskeletal: She exhibits no edema, no tenderness.  Neurological: She is alert and making good eye contact MAS:  LUE and LLE. LUE 1-2/4. LLE 3/4. Continued significant flexor tone in LUE and LLE.   -elbow easily in spint---extending to 120 degrees.   -able to passively range to left knee to 170+  -tolerating PROM  better  Motor: 3- to 3/5 RUE and RLE  2-3/5 LUE and LLE Skin: Skin is warmand dry. Macular rash on buttocks/sacrum nearly resolved Psychiatric: alert and engages  Assessment/Plan: 1. Functional, cognitive, and mobility deficits secondary to encephalopathy/debility which require 3+ hours per day of interdisciplinary therapy in a comprehensive inpatient rehab setting. Physiatrist is providing close team supervision and 24 hour management of active medical problems listed below. Physiatrist and rehab team continue to assess barriers to discharge/monitor patient progress toward functional and medical goals.  Function:  Bathing Bathing position   Position: Shower  Bathing parts   Body parts bathed by helper: Right arm, Right upper leg, Left arm, Left upper leg, Chest, Abdomen, Right lower leg, Front perineal area, Left lower leg, Buttocks, Back  Bathing assist Assist Level: 2 helpers      Upper Body Dressing/Undressing Upper body dressing   What is the patient wearing?: Bra, Pull over shirt/dress   Bra - Perfomed by helper: Thread/unthread right bra strap, Thread/unthread left bra strap, Hook/unhook bra (pull down sports bra)   Pull over shirt/dress - Perfomed by helper: Thread/unthread right sleeve, Thread/unthread left sleeve, Put head through opening, Pull shirt over trunk        Upper body assist Assist Level: 2 helpers      Lower Body Dressing/Undressing Lower body dressing   What is the patient wearing?: Pants, Socks, Shoes       Pants- Performed by helper: Thread/unthread right pants leg, Thread/unthread left pants leg, Pull pants up/down, Fasten/unfasten pants  Socks - Performed by helper: Don/doff right sock, Don/doff left sock   Shoes - Performed by helper: Don/doff right shoe, Don/doff left shoe, Fasten right, Fasten left          Lower body assist Assist for lower body dressing: 2 Helpers      Naval architect activity did not occur:  No continent bowel/bladder event   Toileting steps completed by helper: Adjust clothing prior to toileting, Adjust clothing after toileting    Toileting assist Assist level: Two helpers   Transfers Chair/bed transfer   Chair/bed transfer method: Lateral scoot Chair/bed transfer assist level: Total assist (Pt < 25%) Chair/bed transfer assistive device: Sliding board, Armrests     Locomotion Ambulation Ambulation activity did not occur: Safety/medical concerns         Wheelchair   Type: Manual   Assist Level: Dependent (Pt equals 0%)  Cognition Comprehension Comprehension assist level: Understands basic 75 - 89% of the time/ requires cueing 10 - 24% of the time  Expression Expression assist level: Expresses basic 25 - 49% of the time/requires cueing 50 - 75% of the time. Uses single words/gestures.  Social Interaction Social Interaction assist level: Interacts appropriately 25 - 49% of time - Needs frequent redirection.  Problem Solving Problem solving assist level: Solves basic less than 25% of the time - needs direction nearly all the time or does not effectively solve problems and may need a restraint for safety  Memory Memory assist level: Recognizes or recalls 25 - 49% of the time/requires cueing 50 - 75% of the time   Medical Problem List and Plan: 1. Functional, mobility and cognitive/communication deficitssecondary to debility in setting of prior encephalopathy/tetraplegia  -continue CIR PT, OT,  SLP  -pt continues to display improved interaction and therapy tolerance 2. DVT Prophylaxis/Anticoagulation: Pharmaceutical: Lovenox 3. Pain Management: tylenol prn 4. Mood: LCSW to follow for evaluation when appropriate  -no changes 5. Neuropsych: This patient is notcapable of making decisions on herown behalf. 6. Skin/Wound Care: routine pressure relief measures, local care for rash 7. Fluids/Electrolytes/Nutrition:  TF formula changed to Vital 1.5---appreciate RD  assistance  -hopefully this will help with absorbance and distention 8. Spasticity: On baclofen qid and valium.  -dc day time valium. Reduce valium to 4mg  qhs .   -resume klonopin at low dose 0.25mg  bid during the day  -consider dantrium trial   -resting left elbow splint per [facility]      -can push to 120 deg    -should wear after therapies/evening -splinting, dynamic left elbow, knee.  -ROM with therapy -botox injection performed 1/12 --beginning to see progress as pt initiating more use of her left arm and leg. Actually used left hand to rub her nose while I was in the room. Between therapy/splinting should see further gains while on inpatient rehab. Hope to improve transfers/basic mobility  -consider resting knee brace LLE 9. Candida UTI/C diff colitis: tested + 12/27. has completed treatment on 01/31/16.   -continue contact precautions for c diff thru 03/01/16 if here 10 VDRF: now with cuffless #6 trach.  tolerating PMV better  -  nebulizers prn and mucinex to loosen secretions.     -pulmonary continues to follow   -continue nebs, chest pt, OOB, suction  -sputum cx with pseudomonas-observation only   -reviewed trach question with pulm/ccs. Husband still wants to speak with pulmonary directly 65. ABLA  Hb 11.0 on 12/10  Cont to monitor    LOS (Days) 16 A FACE TO FACE EVALUATION  WAS PERFORMED  Meredith Staggers, MD 02/19/2016 8:51 AM

## 2016-02-19 NOTE — Progress Notes (Signed)
Speech Language Pathology Weekly Progress and Session Note  Patient Details  Name: Judy Spears MRN: [id number] Date of Birth: [birth year]  Beginning of progress report period: February 12, 2016 End of progress report period: February 19, 2016  Today's Date: 02/19/2016 SLP Individual Time: 1300-1400 SLP Individual Time Calculation (min): 60 min  Short Term Goals: Week 2: SLP Short Term Goal 1 (Week 2): Patient will demonstrate intelligibility at the word level 50% of the time with Max A multimodal cues for use of speech intelligibility strategies.  SLP Short Term Goal 1 - Progress (Week 2): Met SLP Short Term Goal 2 (Week 2): Patient will demonstrate intelligibility at the phrase level 25% of the time with Max A multimodal cues for use of speech intelligibility strategies.  SLP Short Term Goal 2 - Progress (Week 2): Not met SLP Short Term Goal 3 (Week 2): Patient will perform diaphragmatic breathing exercises with Max A multimodal cues.  SLP Short Term Goal 3 - Progress (Week 2): Not met SLP Short Term Goal 4 (Week 2): Patient will utilize multimodal communication to express wants/needs with Max A multimodal cues.  SLP Short Term Goal 4 - Progress (Week 2): Met SLP Short Term Goal 5 (Week 2): Patient will initiate swallow response of ice chip via spoon in less than 7 seconds with Max A multimodal cues with minimal overt s/s of aspiration.  SLP Short Term Goal 5 - Progress (Week 2): Not met    New Short Term Goals: Week 3: SLP Short Term Goal 1 (Week 3): Patient will demonstrate intelligibility at the word level 75% of the time with Mod A multimodal cues for use of speech intelligibility strategies.  SLP Short Term Goal 2 (Week 3): Patient will demonstrate intelligibility at the phrase level 25% of the time with Max A multimodal cues for use of speech intelligibility strategies.  SLP Short Term Goal 3 (Week 3): Patient will perform diaphragmatic breathing exercises with Max A multimodal  cues.  SLP Short Term Goal 4 (Week 3): Patient will utilize multimodal communication to express wants/needs with Mod A multimodal cues.  SLP Short Term Goal 5 (Week 3): Patient will initiate swallow response of ice chip via spoon in less than 7 seconds with Max A multimodal cues with minimal overt s/s of aspiration.   Weekly Progress Updates: Patient has made functional gains and has met 2 of 5 STG's this reporting period. Currently, patient has a #6 cuffless trach and is tolerating her PMSV during all waking hours with all vitals remaining WFL. Patient is ~50% intelligible at the word level and 10-25% intelligible at the phrase level with Max A multimodal cues. Patient is also currently utilizing a letter board for multimodal communication with Max A multimodal cues. Patient has just initiated trials of ice chips, however, trials have been limited due to patient's current respiratory function. Recommend continued ice chip trials with SLP only. Patient would benefit from continued skilled SLP intervention to maximize her functional communication and swallowing function prior to discharge home.     Intensity: Minumum of 1-2 x/day, 30 to 90 minutes Frequency: 3 to 5 out of 7 days Duration/Length of Stay: 1/31 Treatment/Interventions: Dysphagia/aspiration precaution training;Therapeutic Exercise;Patient/family education;Functional tasks;Cognitive remediation/compensation;Cueing hierarchy;Environmental controls;Therapeutic Activities;Speech/Language facilitation;Multimodal communication approach   Daily Session  Skilled Therapeutic Interventions: Skilled treatment session focused on dysphagia and speech goals. Upon arrival, patient was awake while sitting upright in wheelchair with PMSV in place. Patient noted to have increased oral secretions, wet vocal quality and coughing  throughout session. Patient able to clear wet vocal quality in 50% of opportunities with Max A verbal and demonstration cues but  clarity would only last for a short period of time. Due to the abovementioned, ice chips were not administered today. PA was also made aware of SLP's observations.Patient's wet vocal quality along with poor breath support limited patient's speech intelligibility to ~10% today at the phrase level. Patient's vitals remained WFL throughout session. Patient left semi-reclined in wheelchair with caregivers present. Continue with current plan of care.     Function:   Cognition Comprehension Comprehension assist level: Understands basic 75 - 89% of the time/ requires cueing 10 - 24% of the time  Expression Expression assistive device: Talk trach valve Expression assist level: Expresses basic 25 - 49% of the time/requires cueing 50 - 75% of the time. Uses single words/gestures.  Social Interaction Social Interaction assist level: Interacts appropriately 25 - 49% of time - Needs frequent redirection.  Problem Solving Problem solving assist level: Solves basic less than 25% of the time - needs direction nearly all the time or does not effectively solve problems and may need a restraint for safety  Memory Memory assist level: Recognizes or recalls 25 - 49% of the time/requires cueing 50 - 75% of the time   Pain Pain Assessment Pain Assessment: No/denies pain  Therapy/Group: Individual Therapy  Judy Spears 02/19/2016, 4:03 PM

## 2016-02-19 NOTE — Progress Notes (Addendum)
Name: Judy Spears MRN: [id number] DOB: January 04, 1951    ADMISSION DATE:  02/03/2016 CONSULTATION DATE:  1/25  REFERRING MD :  Dr. 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 Lurline Idol was then removed May 2-17 but unfortunately had complications of laryngospasm and had to have trach placed via ENT (#4 cuffed) 2 weeks later followed by tracheal stenosis partial resection.   Patient had ongoing rehabilitation and was eventually decannulated October 2017. In November patient developed a viral illness and then pneumonia with Pseudomonas and Staph requiring tracheostomy and mechanical ventilation. She was treated with cefepime and transferred to [facility] for workup considering epilepsy. There was felt that the electrical activity was not consistent with epilepsy and antiepileptic medicines were eventually held. There she was treated for Klebsiella and Pseudomonas pneumonia with vancomycin, Zosyn, and meropenem. Most recent course of antibiotics ended on 01/25/2016. She was also treated with C. difficile treatment (oral Flagyl and vancomycin) through 01/31/2016. She was also found to have pyelonephritis believed to be due to Candida and was treated with fluconazole through 02/01/2016.  She was treated with mechanical ventilation through 01/26/2016 and has since been transitioned to a trach collar with use of PMSV. On 1/25 patient was noted to have coarse breath sounds and a difficult time clearing her secretions. PCCM was asked to consult.   SIGNIFICANT EVENTS  1/9 > Rehab 1/11  > Tolerated chest PT via vibra vest yesterday well. CXR today with slightly worse today but favor chronic changes with possible scarring versus acute infection. 1/13 > stable and RLL atlectasis  stable v slighty improved with pulm toilet. 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  has a past surgical history that includes Hernia repair (2000); Rhinoplasty (1983); Cesarean section (1986); Breast biopsy (01/01/2011); opirectinal membrane peel; and cataracts right eye. Prior to Admission medications   Medication Sig Start Date End Date Taking? Authorizing Provider  acetaminophen (TYLENOL) 160 MG/5ML solution Place 20.3 mLs (650 mg total) into feeding tube every 6 (six) hours as needed for mild pain, headache or fever. 01/04/16  Yes Cherene Altes, MD  amantadine (SYMMETREL) 50 MG/5ML solution Place 150 mg into feeding tube 2 (two) times daily. In am and at noon   Yes Historical Provider, MD  amLODipine (NORVASC) 5 MG tablet Take 5 mg by mouth daily.   Yes Historical Provider, MD  atorvastatin (LIPITOR) 20 MG tablet Place 20 mg into feeding tube daily.   Yes Historical Provider, MD  baclofen (LIORESAL) 10 MG tablet Place 10 mg into feeding tube every 6 (six) hours.   Yes Historical Provider, MD  chlorhexidine gluconate, MEDLINE KIT, (PERIDEX) 0.12 % solution  15 mLs by Mouth Rinse route 2 (two) times daily. 01/04/16  Yes Cherene Altes, MD  clonazePAM (KLONOPIN) 0.5 MG tablet Take 0.25-0.5 mg by mouth  See admin instructions. 0.'25mg'$  0400, 0.'5mg'$  2000   Yes Historical Provider, MD  Coenzyme Q10 (CO Q 10 PO) Take 1 tablet by mouth at bedtime.   Yes Historical Provider, MD  diazepam (VALIUM) 2 MG tablet Place 2 mg into feeding tube every 6 (six) hours.   Yes Historical Provider, MD  diazepam (VALIUM) 5 MG tablet Place 5 mg into feeding tube every evening.   Yes Historical Provider, MD  docusate (COLACE) 60 MG/15ML syrup Place 60 mg into feeding tube daily.   Yes Historical Provider, MD  fentaNYL (SUBLIMAZE) 100 MCG/2ML injection Inject 0.5 mLs (25 mcg total) into the vein every 2 (two) hours as needed for severe pain. 01/04/16  Yes Cherene Altes, MD  fluticasone (FLONASE) 50 MCG/ACT nasal spray Place 1 spray into both nostrils 2 (two) times daily.   Yes Historical Provider, MD  heparin 5000 UNIT/ML injection Inject 1 mL (5,000 Units total) into the skin every 8 (eight) hours. 01/04/16  Yes Cherene Altes, MD  lacosamide 200 mg in sodium chloride 0.9 % 25 mL Inject 200 mg into the vein every 12 (twelve) hours. 01/04/16  Yes Cherene Altes, MD  Lactobacillus Esau Grew) PACK Take 1 each by mouth 3 (three) times daily.   Yes Historical Provider, MD  levETIRAcetam 1,500 mg in sodium chloride 0.9 % 100 mL Inject 1,500 mg into the vein 2 (two) times daily. 01/04/16  Yes Cherene Altes, MD  loratadine (CLARITIN) 10 MG tablet Take 10 mg by mouth daily.   Yes Historical Provider, MD  LORazepam (ATIVAN) 2 MG/ML injection Inject 0.5-1 mLs (1-2 mg total) into the vein every 15 (fifteen) minutes as needed for seizure (Notify MD/Neurology if needed/given). 01/04/16  Yes Cherene Altes, MD  Melatonin 3 MG TABS Place 6 mg into feeding tube every evening.   Yes Historical Provider, MD  mouth rinse LIQD solution 15 mLs by Mouth Rinse route QID. 01/05/16  Yes Cherene Altes, MD  Nutritional Supplements (FEEDING SUPPLEMENT, VITAL AF 1.2 CAL,) LIQD Place 1,000 mLs into feeding tube continuous. 01/04/16   Yes Cherene Altes, MD  nystatin (MYCOSTATIN/NYSTOP) powder Apply 1 g topically 2 (two) times daily as needed.   Yes Historical Provider, MD  Oxcarbazepine (TRILEPTAL) 600 MG tablet Take 1 tablet (600 mg total) by mouth 2 (two) times daily. 01/04/16  Yes Cherene Altes, MD  pantoprazole sodium (PROTONIX) 40 mg/20 mL PACK Place 20 mLs (40 mg total) into feeding tube at bedtime. 01/04/16  Yes Cherene Altes, MD  sodium chloride 0.9 % infusion Inject 10 mLs into the vein continuous. 01/04/16  Yes Cherene Altes, MD  traZODone (DESYREL) 50 MG tablet Take 50 mg by mouth at bedtime. May take additional '50mg'$  at 1200 prn   Yes Historical Provider, MD  valACYclovir (VALTREX) 500 MG tablet Place 500 mg into feeding tube daily.   Yes Historical Provider, MD  valproate 750 mg in dextrose 5 % 50 mL Inject 750 mg into the vein every 6 (six) hours. 01/04/16  Yes Cherene Altes, MD  Water For Irrigation, Sterile (FREE WATER) SOLN Place 200 mLs into feeding tube every 6 (six) hours. 01/04/16  Yes Cherene Altes, MD  insulin aspart (NOVOLOG) 100 UNIT/ML injection Inject 0-9 Units into the skin every 4 (four) hours. Patient not taking:  Reported on 02/03/2016 01/04/16   Cherene Altes, MD   Allergies  Allergen Reactions  . Pollen Extract Other (See Comments)    Running nose  . Tetracyclines & Related     Blisters, photosensitivity    FAMILY HISTORY:  family history includes Colon cancer in her father; Heart attack (age of onset: 58) in her maternal grandfather; Hypertension in her mother; Prostate cancer in her father; Stroke in her mother; Stroke (age of onset: 10) in her maternal uncle. SOCIAL HISTORY:  reports that she has never smoked. She has never used smokeless tobacco. She reports that she drinks alcohol. She reports that she does not use drugs.  REVIEW OF SYSTEMS:   All negative; except for those that are bolded, which indicate positives.  Constitutional: weight loss, weight gain,  night sweats, fevers, chills, fatigue, weakness.  HEENT: headaches, sore throat, sneezing, nasal congestion, post nasal drip, difficulty swallowing, tooth/dental problems, visual complaints, visual changes, ear aches. Neuro: difficulty with speech, weakness, numbness, ataxia. CV:  chest pain, orthopnea, PND, swelling in lower extremities, dizziness, palpitations, syncope.  Resp: cough, hemoptysis, dyspnea, wheezing. GI: heartburn, indigestion, abdominal pain, nausea, vomiting, diarrhea, constipation, change in bowel habits, loss of appetite, hematemesis, melena, hematochezia.  GU: dysuria, change in color of urine, urgency or frequency, flank pain, hematuria. MSK: joint pain or swelling, decreased range of motion. Psych: change in mood or affect, depression, anxiety, suicidal ideations, homicidal ideations. Skin: rash, itching, bruising.  SUBJECTIVE:  Working with therapy, up in chair, required RT deep suction to clear secretions   VITAL SIGNS: Temp:  [97.6 F (36.4 C)] 97.6 F (36.4 C) (01/25 0556) Pulse Rate:  [77-92] 84 (01/25 0848) Resp:  [18-20] 18 (01/25 0848) BP: (135-138)/(78-80) 135/78 (01/25 0915) SpO2:  [94 %-96 %] 96 % (01/25 0848) FiO2 (%):  [21 %-28 %] 21 % (01/25 1153) Weight:  [54.4 kg (120 lb)] 54.4 kg (120 lb) (01/25 0556)  PHYSICAL EXAMINATION: General: Adult female, no distress, in chair   Neuro: Alert, pupils intact, RUE/RLE 3/5, LUE/LLE 2/5  HEENT: Trach in place  Cardiovascular: RRR, no MRG, NI S1/S2   Lungs: clear, non-labored, on 21% trach collar   Abdomen: active bowel sounds, non-distended, non-tender   Musculoskeletal: no edema, no acute  Skin: warm, dry, intact    Recent Labs Lab 02/16/16 0803  NA 133*  K 3.6  CL 94*  CO2 29  BUN 10  CREATININE 0.32*  GLUCOSE 131*    Recent Labs Lab 02/16/16 0803  HGB 12.5  HCT 38.1  WBC 5.0  PLT 322   Dg Abd Portable 1v  Result Date: 02/18/2016 CLINICAL DATA:  Abdominal distension EXAM: PORTABLE  ABDOMEN - 1 VIEW COMPARISON:  None. FINDINGS: Scattered large and small bowel gas is noted. No obstructive changes are seen. A gastrostomy catheter is noted in the upper abdomen. Mild degenerative changes of lumbar spine are seen. IMPRESSION: No acute abnormality noted. Electronically Signed   By: Inez Catalina M.D.   On: 02/18/2016 15:40    ASSESSMENT / PLAN:  Acute on Chronic Respiratory Failure secondary to Healthcare associated PNA s/p Tracheostomy (#6 cuffless)  -Trend CXR -Trach Care per protocol -Suction as needed  -Maintain Saturation >92 -Aspiration Precautions  -Continue working with Speech Therapy  -Pulmonary hygiene  -Continue scheduled Robitussin  -Continue Chest PT  -PRN albuterol     Hayden Pedro, AG-ACNP [facility]  Pgr: [phone]  PCCM Pgr: [phone]  Attending note: I have seen and examined the  patient with nurse practitioner/resident and agree with the note. History, labs and imaging reviewed.  65 year old with viral encephalitis, prolonged hospitalization with recurrent respiratory failure and tracheostomies. Now maintained on trach #6 cuffless PCCM called today to evaluate her for thick secretions, mucus plugs  Blood pressure 128/68, pulse 84, temperature 97.8 F (36.6 C), temperature source Oral, resp. rate 16, height '5\' 4"'$  (1.626 m), weight 120 lb (54.4 kg), last menstrual period 12/01/2010, SpO2 98 %. Gen:      No acute distress HEENT:  EOMI, sclera anicteric Neck:     No masses; no thyromegaly Lungs:    Clear to auscultation bilaterally; normal respiratory effort CV:         Regular rate and rhythm; no murmurs Abd:      + bowel sounds; soft, non-tender; no palpable masses, no distension Ext:    No edema; adequate peripheral perfusion Skin:      Warm and dry; no rash Neuro: alert and oriented x 3 Psych: normal mood and affect  Assessment/Plan: Acute on chronic respiratory failure Recent HCAP pneumonia Tracheostomy #6  cuffless  She appears to be stable now with minimal oxygen requirements. Chest x-ray done today reviewed. It does not show any acute changes or infiltrate. Continue chest PT, pulmonary hygiene Keep her hydrated to thin out the secretions No need for antibiotics at present. Would not recommend downsizing the trach given her ongoing difficulty with seceretions  Marshell Garfinkel MD [facility] Pager [phone] If no answer or after 3pm call: [phone] 02/19/2016, 5:42 PM

## 2016-02-19 NOTE — Progress Notes (Signed)
Patient with increased oral secretions today and difficult to clear per ST. Was able to clear with cues.Lungs with coarse sounds BLE and RUL. Will order CXR for evaluation. RT consulted to perform chest CT and respiratory treatment to help mobilize secretions. Will monitor.

## 2016-02-19 NOTE — Progress Notes (Signed)
Occupational Therapy Session Note  Patient Details  Name: PRITI MCNULTY MRN: [id number] Date of Birth: May 11, 1950  Today's Date: 02/19/2016 OT Individual Time: 0930-1030 OT Individual Time Calculation (min): 60 min    Short Term Goals: Week 3:  OT Short Term Goal 1 (Week 3): STG=LTG due to LOS  Skilled Therapeutic Interventions/Progress Updates:    Pt seen for OT session focusing on ADL re-training and functional transfers with caregiver. Pt transitioning into w/c upon arrival with RN and personal care attendant provding assist. Pt agreeable to tx session and voicing need to complete toileting task. STEADY used for transfer to toilet and toileting tasks able to be completed total A +1 caregiver.  Remainder of session focusing on functional transfers without use of STEADY as pt only has STEADY on main level at home and must complete w/c<> bed, lif tchair<> w/c and w/c <> shower bench and toilet without use of STEADY.  Recommending sliding board transfers for these transfers for increased safety for both pt and caregiver. Caregiver [name],provided total A for sliding board transfer w/c > bed transferring to pt's stronger R side (as she is able to assist more with anterior lean to L). Therapist provided assist for return to chair going to pt's L with VCs and demonstration being provided for technique as part of education. Caregiver and pt will cont to benefit from hands on training using sliding board and other transfer methods when STEADY is not available.  Pt left seated in w/c at end of session, caregiver present.   Therapy Documentation Precautions:  Precautions Precautions: Fall Precaution Comments: Trach, PEG, supplemental O2, PMSV Required Braces or Orthoses: Other Brace/Splint Other Brace/Splint: Pt has B AFOs and UE splints Restrictions Weight Bearing Restrictions: No Pain: Pain Assessment Pain Assessment: No/denies pain  See Function Navigator for Current Functional  Status.   Therapy/Group: Individual Therapy  [name], Johan Creveling C 02/19/2016, 7:12 AM

## 2016-02-19 NOTE — Progress Notes (Signed)
Patient not wanting to do chest vest at this time. RT will try again later.

## 2016-02-19 NOTE — Progress Notes (Signed)
Physical Therapy Session Note  Patient Details  Name: Judy Spears MRN: [id number] Date of Birth: April 24, 1950  Today's Date: 02/19/2016 PT Individual Time: 1100-1200 and 1430-1500 PT Individual Time Calculation (min): 60 min and 30 min (total 90 min)   Short Term Goals: Week 3:  PT Short Term Goal 1 (Week 3): =LTG due to estimated length of stay  Skilled Therapeutic Interventions/Progress Updates: Tx 1: Pt received seated in w/c; denies pain and agreeable to treatment. Transfer w/c <>mat table maxA squat pivot for energy conservation. Sit <>stand x5 rep with platform walker with maxA +2 to boost to stand and minA for static standing balance with cues at B glutes/quads. Cued pt in controlling breathing and exhale with BLE knee/hip extension with notable improvement in coordination/strength. Sitting balance and trunk control reeducation with R lateral lean onto elbow for L trunk lengthening and R trunk activation. Seated on wedge under R hip for R trunk shortening/activaiton pt able to achieve and maintain midline orientation. Small wedge placed under R hip in w/c for continued R trunk shortening/activation while seated in chair with improve postural alignment observed when returned to chair. Pt on room air throughout session; remained >96% throughout. Remained seated in w/c at end of session, all needs in reach.   Tx 2: Pt received seated in w/c with RT present performing suction; denies pain and agreeable to treatment. Session with focus on sit <>stand in stedy for LE strengthening and coordination. Performed x5 reps initially with mod/maxA from w/c and stedy seat, however improved performance with decreased assist following repetition. Pt performed sit <>stand x15 reps self-initiated with S from stedy seat. W/c cushion exchanged for same cushion with increased length for better fit in chair. Remained seated in w/c at end of session with handoff to OT.      Therapy Documentation Precautions:   Precautions Precautions: Fall Precaution Comments: Trach, PEG, supplemental O2, PMSV Required Braces or Orthoses: Other Brace/Splint Other Brace/Splint: Pt has B AFOs and UE splints Restrictions Weight Bearing Restrictions: No Pain: Pain Assessment Pain Assessment: No/denies pain   See Function Navigator for Current Functional Status.   Therapy/Group: Individual Therapy  Luberta Mutter 02/19/2016, 4:13 PM

## 2016-02-19 NOTE — Progress Notes (Signed)
Occupational Therapy Session Note  Patient Details  Name: JILLANE PO MRN: [id number] Date of Birth: 1950/12/15  Today's Date: 02/19/2016 OT Individual Time: 1505-1540 OT Individual Time Calculation (min): 35 min   Skilled Therapeutic Interventions/Progress Updates:    Pt greeted in w/c as x-ray entered room for chest x-ray. Pt with metal on her bra- so OT assisted pt with doffing bra and shirt. Pt required Total A UB dressing activity but able to initiate threading R UE and LUE into sleeve. Pt then completed graded peg board activity with focus on functional grasp and reach. Pt required elbow support to increase distal control for peg placement. Pt left seated in w/c at end of session with caregiver present and needs met.   Therapy Documentation Precautions:  Precautions Precautions: Fall Precaution Comments: Trach, PEG, supplemental O2, PMSV Required Braces or Orthoses: Other Brace/Splint Other Brace/Splint: Pt has B AFOs and UE splints Restrictions Weight Bearing Restrictions: No Pain: Pain Assessment Pain Assessment: No/denies pain   See Function Navigator for Current Functional Status.   Therapy/Group: Individual Therapy  [name] 02/19/2016, 3:50 PM

## 2016-02-19 NOTE — Progress Notes (Signed)
RT went to evaluate patient. Patients BS's were Rhonchi. RT was able to saline suction patient in order to clear moderate thick solid secretions. Patient now has clear and diminished BS's. RT did not perform CPT due to PT coming in to work with patient. RT will monitor.

## 2016-02-20 ENCOUNTER — Inpatient Hospital Stay (HOSPITAL_COMMUNITY): Payer: BC Managed Care – PPO | Admitting: Physical Therapy

## 2016-02-20 ENCOUNTER — Inpatient Hospital Stay (HOSPITAL_COMMUNITY): Payer: BC Managed Care – PPO | Admitting: Occupational Therapy

## 2016-02-20 ENCOUNTER — Inpatient Hospital Stay (HOSPITAL_COMMUNITY): Payer: BC Managed Care – PPO | Admitting: Speech Pathology

## 2016-02-20 LAB — CBC WITH DIFFERENTIAL/PLATELET
Basophils Absolute: 0 10*3/uL (ref 0.0–0.1)
Basophils Relative: 1 %
Eosinophils Absolute: 0.1 10*3/uL (ref 0.0–0.7)
Eosinophils Relative: 2 %
HCT: 36.1 % (ref 36.0–46.0)
Hemoglobin: 11.8 g/dL — ABNORMAL LOW (ref 12.0–15.0)
Lymphocytes Relative: 32 %
Lymphs Abs: 1.8 10*3/uL (ref 0.7–4.0)
MCH: 29.9 pg (ref 26.0–34.0)
MCHC: 32.7 g/dL (ref 30.0–36.0)
MCV: 91.4 fL (ref 78.0–100.0)
Monocytes Absolute: 0.7 10*3/uL (ref 0.1–1.0)
Monocytes Relative: 13 %
Neutro Abs: 3 10*3/uL (ref 1.7–7.7)
Neutrophils Relative %: 52 %
Platelets: 304 10*3/uL (ref 150–400)
RBC: 3.95 MIL/uL (ref 3.87–5.11)
RDW: 16.5 % — ABNORMAL HIGH (ref 11.5–15.5)
WBC: 5.6 10*3/uL (ref 4.0–10.5)

## 2016-02-20 NOTE — Telephone Encounter (Signed)
Spoke with pt's husband, and made him aware of below message. Judy Spears states he is not currently at the hosp with his wife. Judy Spears plans on being there tomorrow and states that tomorrow will be great.  Will route to BQ as a FYI.

## 2016-02-20 NOTE — Progress Notes (Signed)
[facility] PHYSICAL MEDICINE & REHABILITATION     PROGRESS NOTE    Subjective/Complaints: Sitting in bed. Dressed. Appears to be in good spirits.   ROS: Limited due cognitive/behavioral       Objective: Vital Signs: Blood pressure (!) 150/89, pulse 81, temperature 98.7 F (37.1 C), temperature source Oral, resp. rate 16, height 5\' 4"  (1.626 m), weight 54.5 kg (120 lb 2.4 oz), last menstrual period 12/01/2010, SpO2 98 %. Dg Chest Port 1 View  Result Date: 02/19/2016 CLINICAL DATA:  Cough. EXAM: PORTABLE CHEST 1 VIEW COMPARISON:  02/11/2016.  02/03/2016 . FINDINGS: Tracheostomy tube noted with its tip over the trachea. Bibasilar subsegmental atelectasis. No pleural effusion or pneumothorax. Heart size normal. No pneumothorax . IMPRESSION: Bibasilar subsegmental atelectasis and/or scarring. Chest is stable from prior exam. Electronically Signed   By: Marcello Moores  Register   On: 02/19/2016 15:22   Dg Abd Portable 1v  Result Date: 02/18/2016 CLINICAL DATA:  Abdominal distension EXAM: PORTABLE ABDOMEN - 1 VIEW COMPARISON:  None. FINDINGS: Scattered large and small bowel gas is noted. No obstructive changes are seen. A gastrostomy catheter is noted in the upper abdomen. Mild degenerative changes of lumbar spine are seen. IMPRESSION: No acute abnormality noted. Electronically Signed   By: Inez Catalina M.D.   On: 02/18/2016 15:40    Recent Labs  02/20/16 0723  WBC 5.6  HGB 11.8*  HCT 36.1  PLT 304   No results for input(s): NA, K, CL, GLUCOSE, BUN, CREATININE, CALCIUM in the last 72 hours.  Invalid input(s): CO CBG (last 3)  No results for input(s): GLUCAP in the last 72 hours.  Wt Readings from Last 3 Encounters:  02/20/16 54.5 kg (120 lb 2.4 oz)  02/03/16 52.2 kg (115 lb)  01/04/16 56.6 kg (124 lb 12.5 oz)    Physical Exam:  Constitutional: She appears well-developed.   NAD. HENT: Normocephalicand atraumatic.  Eyes: No discharge, EOM appear intact Neck: #6 trach clean/area  inatct.   Cardiovascular: RRR Respiratory: chest clear bilaterally GI: BS+, NT, ND.  PEG site remains clean and dry. Musculoskeletal: She exhibits no edema, no tenderness.  Neurological: She is alert and making good eye contact MAS:  LUE and LLE. LUE 1-2/4. LLE now 1-2/4.  .   -elbow with PROM to 130-140---intiating more extension on her own  -pt is actively extending left knee to almost 180 degrees!     Motor: 3- to 3/5 RUE and RLE  2-3/5 LUE and LLE Skin: Skin is warmand dry.  rash on buttocks/sacrum nearly resolved Psychiatric: alert and engages  Assessment/Plan: 1. Functional, cognitive, and mobility deficits secondary to encephalopathy/debility which require 3+ hours per day of interdisciplinary therapy in a comprehensive inpatient rehab setting. Physiatrist is providing close team supervision and 24 hour management of active medical problems listed below. Physiatrist and rehab team continue to assess barriers to discharge/monitor patient progress toward functional and medical goals.  Function:  Bathing Bathing position   Position: Shower  Bathing parts   Body parts bathed by helper: Right arm, Right upper leg, Left arm, Left upper leg, Chest, Abdomen, Right lower leg, Front perineal area, Left lower leg, Buttocks, Back  Bathing assist Assist Level: 2 helpers      Upper Body Dressing/Undressing Upper body dressing   What is the patient wearing?: Bra, Pull over shirt/dress   Bra - Perfomed by helper: Thread/unthread right bra strap, Thread/unthread left bra strap   Pull over shirt/dress - Perfomed by helper: Thread/unthread right sleeve, Thread/unthread left  sleeve, Put head through opening, Pull shirt over trunk        Upper body assist Assist Level: 2 helpers      Lower Body Dressing/Undressing Lower body dressing   What is the patient wearing?: Pants, Socks, Shoes       Pants- Performed by helper: Thread/unthread right pants leg, Thread/unthread left pants  leg, Pull pants up/down, Fasten/unfasten pants       Socks - Performed by helper: Don/doff right sock, Don/doff left sock   Shoes - Performed by helper: Don/doff right shoe, Don/doff left shoe, Fasten right, Fasten left          Lower body assist Assist for lower body dressing: 2 Automotive engineer activity did not occur: No continent bowel/bladder event   Toileting steps completed by helper: Adjust clothing prior to toileting, Performs perineal hygiene, Adjust clothing after toileting    Toileting assist Assist level: Two helpers   Transfers Chair/bed transfer   Chair/bed transfer method: Lateral scoot Chair/bed transfer assist level: Total assist (Pt < 25%) Chair/bed transfer assistive device: Sliding board, Armrests     Locomotion Ambulation Ambulation activity did not occur: Safety/medical concerns         Wheelchair   Type: Manual   Assist Level: Dependent (Pt equals 0%)  Cognition Comprehension Comprehension assist level: Understands basic 75 - 89% of the time/ requires cueing 10 - 24% of the time  Expression Expression assist level: Expresses basic 25 - 49% of the time/requires cueing 50 - 75% of the time. Uses single words/gestures.  Social Interaction Social Interaction assist level: Interacts appropriately 25 - 49% of time - Needs frequent redirection.  Problem Solving Problem solving assist level: Solves basic less than 25% of the time - needs direction nearly all the time or does not effectively solve problems and may need a restraint for safety  Memory Memory assist level: Recognizes or recalls 25 - 49% of the time/requires cueing 50 - 75% of the time   Medical Problem List and Plan: 1. Functional, mobility and cognitive/communication deficitssecondary to debility in setting of prior encephalopathy/tetraplegia  -continue CIR PT, OT,  SLP  -pt is engaging/interacting more and more with staff,family.  2. DVT  Prophylaxis/Anticoagulation: Pharmaceutical: Lovenox 3. Pain Management: tylenol prn 4. Mood: LCSW to follow for evaluation when appropriate  -no changes 5. Neuropsych: This patient is notcapable of making decisions on herown behalf. 6. Skin/Wound Care: routine pressure relief measures, local care for rash 7. Fluids/Electrolytes/Nutrition:  TF formula changed to Vital 1.5---appreciate RD assistance  -hopefully this will help with absorbance and distention 8. Spasticity: On baclofen qid and valium.  -now off day time valium. Reduced valium to 4mg  qhs--reduce further as tolerated.   -will hold off on resuming klonopin at this time  -consider dantrium trial   -resting left elbow splint per [facility]      -can push to 130 deg +    -should wear after therapies/evening -splinting, dynamic left elbow, knee.  -ROM with therapy -botox injection performed 1/12 --beginning to see progress as pt initiating more use of her left arm and leg. Actually used left hand to rub her nose while I was in the room. Between therapy/splinting should see further gains while on inpatient rehab. Hope to improve transfers/basic mobility  -consider resting knee brace LLE 9. Candida UTI/C diff colitis: tested + 12/27. has completed treatment on 01/31/16.   -continue contact precautions for c diff thru 03/01/16 if here 10  VDRF: now with cuffless #6 trach.  tolerating PMV   -  nebulizers prn and mucinex to loosen secretions. ?mucous plug yesterday?    -pulmonary continues to follow--to see pt again today. Appreciate their follow up   -continue nebs, chest pt, OOB, suction  -sputum cx with pseudomonas-observation only  -CCS favors keeping #6 trach in. Husband has months of supplies for #4 at home, specifically disposable inner cannulas    11. ABLA  Hb 11.8 today  Cont to monitor    LOS (Days) 17 A FACE TO FACE EVALUATION WAS PERFORMED  Meredith Staggers, MD 02/20/2016 10:17  AM

## 2016-02-20 NOTE — Progress Notes (Signed)
Social Work Patient ID: Judy Spears, female   DOB: 05-31-1950, 66 y.o.   MRN: [id number]   Spoke with pt's spouse and caregiver today to review d/c referrals needed to prepare for 1/31 d/c.  They would like to resume OP therapies at [facility].  No DME needs as they have all needed DME.  Have made referral to outpatient and scheduled to begin on 03/01/16.  Continue to follow.  Imoni Kohen, LCSW

## 2016-02-20 NOTE — Telephone Encounter (Signed)
I am there tomorrow.  Please let him know that I can be there tomorrow but if he would like someone to come sooner I can send them now.

## 2016-02-20 NOTE — Progress Notes (Signed)
Speech Language Pathology Daily Session Note  Patient Details  Name: Judy Spears MRN: [id number] Date of Birth: [birth year]  Today's Date: 02/20/2016 SLP Individual Time: 1300-1400 SLP Individual Time Calculation (min): 60 min  Short Term Goals: Week 3: SLP Short Term Goal 1 (Week 3): Patient will demonstrate intelligibility at the word level 75% of the time with Mod A multimodal cues for use of speech intelligibility strategies.  SLP Short Term Goal 2 (Week 3): Patient will demonstrate intelligibility at the phrase level 25% of the time with Max A multimodal cues for use of speech intelligibility strategies.  SLP Short Term Goal 3 (Week 3): Patient will perform diaphragmatic breathing exercises with Max A multimodal cues.  SLP Short Term Goal 4 (Week 3): Patient will utilize multimodal communication to express wants/needs with Mod A multimodal cues.  SLP Short Term Goal 5 (Week 3): Patient will initiate swallow response of ice chip via spoon in less than 7 seconds with Max A multimodal cues with minimal overt s/s of aspiration.   Skilled Therapeutic Interventions: Skilled treatment session focused on speech intelligibility goals. SLP facilitated session by providing Max A multimodal cues for accuracy with diaphragmatic breathing exercises and for use of speech intelligibility strategies at the phrase level to achieve less than 25% intelligibility. Patient appeared mildly confused/disoriented throughout session and was perseverative on finding a "red box" and reported that she was in "[place]" and would consistently disagree with clinician when SLP attempted to reorient patient to correct country, state and city. RN aware. Patient left upright in wheelchair with caregiver present. Continue with current plan of care.    Function:  Cognition Comprehension Comprehension assist level: Understands basic 75 - 89% of the time/ requires cueing 10 - 24% of the time  Expression   Expression assist  level: Expresses basic 25 - 49% of the time/requires cueing 50 - 75% of the time. Uses single words/gestures.  Social Interaction Social Interaction assist level: Interacts appropriately 25 - 49% of time - Needs frequent redirection.  Problem Solving Problem solving assist level: Solves basic less than 25% of the time - needs direction nearly all the time or does not effectively solve problems and may need a restraint for safety  Memory Memory assist level: Recognizes or recalls 25 - 49% of the time/requires cueing 50 - 75% of the time    Pain No/Denies Pain   Therapy/Group: Individual Therapy  Clete Kuch 02/20/2016, 4:12 PM

## 2016-02-20 NOTE — Progress Notes (Signed)
Occupational Therapy Session Note  Patient Details  Name: Judy Spears MRN: [id number] Date of Birth: [birth year]  Today's Date: 02/20/2016 OT Individual Time: 1000-1100 OT Individual Time Calculation (min): 60 min    Short Term Goals: Week 3:  OT Short Term Goal 1 (Week 3): STG=LTG due to LOS  Skilled Therapeutic Interventions/Progress Updates:    Pt seen for OT session focusing on caregiver training and functional transfers. Pt in supine upon arrival, agreeable to tx session, husband and personal care attendant present. Pt voiced being ready for session and verbalizing need to complete toileting task. Encouraged family to complete transfers how they would at home if pt was in bed and needed to toilet (i.e. Not use STEADY as bedroom upstairs and STEADY downstairs). Caregivers voiced wanting to cont with therapy and use STEADY for time/ ease now. Transfers and toileting task completed total A +1. Today, pt able to stand independently from [place] and able to maintain stand while caregiver provided assist.  In ADL apartment, pt completed simulated tub/shower combination to tub transfer bench with bathroom set-up in simulation of home environment. Completed transfer to tub bench via sliding board with +2 assist provided from two OTs. This was a very difficult transfer done in simulation and know that actual shower transfer ( with water, no clothing, etc.) will be much more difficult. Pt's caregivers reported they were doing showers PTA and will cont to shower pt upon d/c. Provided education and recommendations for increased safety with task, however, made caregivers aware of difficulty of transfers as well as risks associated with task. Pt returned to w/c and pt's caregivers taking her to PT session.   Therapy Documentation Precautions:  Precautions Precautions: Fall Precaution Comments: Trach, PEG, supplemental O2, PMSV Required Braces or Orthoses: Other Brace/Splint Other  Brace/Splint: Pt has B AFOs and UE splints Restrictions Weight Bearing Restrictions: No Pain:    See Function Navigator for Current Functional Status.   Therapy/Group: Individual Therapy  [name], [name] 02/20/2016, 7:19 AM

## 2016-02-20 NOTE — Progress Notes (Deleted)
Patient transported to CT and back to room 3M02 without complications.  °

## 2016-02-20 NOTE — Progress Notes (Signed)
Physical Therapy Session Note  Patient Details  Name: Judy Spears MRN: [id number] Date of Birth: 11-25-1950  Today's Date: 02/20/2016 PT Individual Time: 1100-1145 and 1415-1500 PT Individual Time Calculation (min): 45 min and 45 min (total 90 min)   Short Term Goals: Week 3:  PT Short Term Goal 1 (Week 3): =LTG due to estimated length of stay  Skilled Therapeutic Interventions/Progress Updates: Tx 1: Pt received seated in w/c with handoff from OT; denies pain and agreeable to treatment. Lateral scoot transfer w/c >mat table with transfer board and maxA; therapist facilitating anterior weight shift, stool under B feet and cues to assist with push through LEs for hip clearance over board while scooting. Sit <>stand x5 trials from elevated mat table with platform RW maxA. Cues for coordinating breathing with exertional activity. Tactile cueing for B knee extension in stance, tactile cue for hip extension. R lateral leans onto elbow during rest breaks for L trunk lengthening and postural retraining. Squat pivot transfer totalA mat>w/c for energy conservation. Remained seated in w/c at end of session with husband and caregiver to assist with transport back to room.   Tx 2: Pt received seated in w/c, denies pain and agreeable to treatment. Discussed with pt goal of riding nustep; pt became perseverative on having different tennis shoes despite redirection and assuring pt that current shoes were ok. Eventually able to redirect and transported pt to gym. Squat pivot w/c <>nustep totalA. Performed nustep x12 min total BLE and RUE (LUE not involved d/t contracture and unable to reach handle); performed for BLE/UE coordination. Sit >stand in stedy with modA. Transferred onto toilet; S for standing balance while therapist doffs pants and brief. Pt unable to void right away; pt remained seated in toilet with caregiver [name] present. RN alerted to pt position.      Therapy Documentation Precautions:   Precautions Precautions: Fall Precaution Comments: Trach, PEG, supplemental O2, PMSV Required Braces or Orthoses: Other Brace/Splint Other Brace/Splint: Pt has B AFOs and UE splints Restrictions Weight Bearing Restrictions: No   See Function Navigator for Current Functional Status.   Therapy/Group: Individual Therapy  Luberta Mutter 02/20/2016, 3:18 PM

## 2016-02-21 ENCOUNTER — Inpatient Hospital Stay (HOSPITAL_COMMUNITY): Payer: BC Managed Care – PPO | Admitting: Speech Pathology

## 2016-02-21 NOTE — Progress Notes (Signed)
Speech Language Pathology Daily Session Note  Patient Details  Name: Judy Spears MRN: [id number] Date of Birth: 22-Feb-1950  Today's Date: 02/21/2016 SLP Individual Time: 1510-1540 SLP Individual Time Calculation (min): 30 min  Short Term Goals: Week 3: SLP Short Term Goal 1 (Week 3): Patient will demonstrate intelligibility at the word level 75% of the time with Mod A multimodal cues for use of speech intelligibility strategies.  SLP Short Term Goal 2 (Week 3): Patient will demonstrate intelligibility at the phrase level 25% of the time with Max A multimodal cues for use of speech intelligibility strategies.  SLP Short Term Goal 3 (Week 3): Patient will perform diaphragmatic breathing exercises with Max A multimodal cues.  SLP Short Term Goal 4 (Week 3): Patient will utilize multimodal communication to express wants/needs with Mod A multimodal cues.  SLP Short Term Goal 5 (Week 3): Patient will initiate swallow response of ice chip via spoon in less than 7 seconds with Max A multimodal cues with minimal overt s/s of aspiration.   Skilled Therapeutic Interventions: Skilled treatment session focused on speech intelligibility. SLP facilitated session by providing Max A multimodal cues for coordinating diaphragmatic breathing with phonation to increase intelligibility to ~75% for unknown information (at the word level) to this therapist (such as children's names, pet's name). Pt with ~25% intelligibility at the phrase level when describing common object with Max a multimodal cues for use of speech intelligibility strategies (mostly coordinating phonation with exhalation). Pt required Min A verbal cues to redirect to task during 30 minute session. Pt handed off to husband, left upright in chair with all needs within reach. Continue per current plan of care.      Function:    Cognition Comprehension Comprehension assist level: Understands basic 75 - 89% of the time/ requires cueing 10 - 24% of  the time  Expression Expression assistive device: Talk trach valve    Social Interaction Social Interaction assist level: Interacts appropriately 25 - 49% of time - Needs frequent redirection.  Problem Solving Problem solving assist level: Solves basic less than 25% of the time - needs direction nearly all the time or does not effectively solve problems and may need a restraint for safety  Memory Memory assist level: Recognizes or recalls 25 - 49% of the time/requires cueing 50 - 75% of the time    Pain    Therapy/Group: Individual Therapy Semaje Kinker B. Rutherford Nail, M.S., CCC-SLP Speech-Language Pathologist  Nickalos Petersen 02/21/2016, 4:07 PM

## 2016-02-21 NOTE — Plan of Care (Signed)
Problem: RH BOWEL ELIMINATION Goal: RH STG MANAGE BOWEL WITH ASSISTANCE STG Manage Bowel with total Assistance.  Outcome: Not Progressing Total assist with bowel management

## 2016-02-21 NOTE — Progress Notes (Signed)
[facility] PCCM  S: asked by husband to see her this weekend.  She feels well. Hasn't been suctioned in nearly a week. Tolerates speaking valve during daytime only.  O:. Vitals:   02/21/16 0311 02/21/16 0610 02/21/16 0715 02/21/16 1157  BP:  134/77    Pulse: 87 83 73 69  Resp: 18   17  Temp:  98.8 F (37.1 C)    TempSrc:  Axillary    SpO2: 98% 97% 95% 93%  Weight:  119 lb 0.8 oz (54 kg)    Height:       RA  On exam Gen: sitting up in chair, smiling, watching basketball HENT: NCAT Trach in place, speaking valve on PULM: CTA B normal effort CV: RRR, no mgr GI: BS+, soft, nontender Derm: no rash or skin breakdown  1/25 CXR images reviewed showing normal pulmonary parenchyma with some atelectasis in bases   Impression/plan  Tracheostomy status> now doing well, minimal secretions but remains relatively high risk for recurrent respiratory failure or pneumonia.  Plan downsize tracheostomy to #4 cuffless with speaking valve on 1/29.  Will maintain speaking valve during daytime for the next few weeks.  Once she has gone several weeks without suctioning we can cap the trach.  In the long run may consider using a Jackson trach, but that decision can be made months from now.  If we decide to ever decannulate again will need to revisit ENT for tracheal stenosis and consider surgical closure of her trach stoma.  Husband updated bedside by me.  30 minute visit  Roselie Awkward, MD [place] PCCM Pager: [phone] Cell: [phone] After 3pm or if no response, call [phone]

## 2016-02-21 NOTE — Plan of Care (Signed)
Problem: RH BLADDER ELIMINATION Goal: RH STG MANAGE BLADDER WITH ASSISTANCE STG Manage Bladder With total  Assistance  Outcome: Not Progressing Patient is a total assist with bladder assistance

## 2016-02-21 NOTE — Progress Notes (Signed)
02/21/16 1800 nursing Patient unable to tolerate 200ccc flush. Patient vomited  meds  x1 given this am. Husband in room at that time.  No further episodes of vomiting during shift. Will continue to monitor.

## 2016-02-21 NOTE — Progress Notes (Signed)
Patient ID: Judy Spears, female   DOB: 1950-10-25, 66 y.o.   MRN: [id number]   02/21/2016.  Judy Spears is a 66 y.o. female admit for CIR with functional, mobility and cognitive/communication deficitssecondary to debility in setting of prior encephalopathy/tetraplegia  Subjective: No new complaints. No new problems. Slept well. Feeling OK.  Past Medical History:  Diagnosis Date  . Arthritis    lt great toe  . Complication of anesthesia    pt has had headaches post op-did advise to hydra well preop  . Hair loss    right-sided  . History of exercise stress test    a. ETT (12/15) with 12:00 exercise, no ST changes, occasional PACs.   . Hyperlipidemia   . Insomnia   . Migraine headache   . 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 Postmenopausal   . Premature atrial contractions    a. Holter (12/15) with 8% PACs, no atrial runs or atrial fibrillation.      Objective: Vital signs in last 24 hours: Temp:  [98.7 F (37.1 C)-98.8 F (37.1 C)] 98.8 F (37.1 C) (01/27 0610) Pulse Rate:  [67-87] 73 (01/27 0715) Resp:  [16-18] 18 (01/27 0311) BP: (127-134)/(77-82) 134/77 (01/27 0610) SpO2:  [95 %-99 %] 95 % (01/27 0715) Weight:  [119 lb 0.8 oz (54 kg)] 119 lb 0.8 oz (54 kg) (01/27 0610) Weight change: -1 lb 1.6 oz (-0.5 kg) Last BM Date: 02/18/16  Intake/Output from previous day: No intake/output data recorded. Last cbgs: CBG (last 3)  No results for input(s): GLUCAP in the last 72 hours.   Physical Exam General: No apparent distress   HEENT: thick dry mucus in oropharynx; s/p trach Lungs: Normal effort. Lungs clear to auscultation, no crackles or wheezes. Cardiovascular: Regular rate and rhythm, no edema Abdomen: S/NT/ND; BS(+) Musculoskeletal:  Neg; L arm in brace Neurological: No new neurological deficits Wounds: N/A    Skin: clear   Mental state: Alert, oriented, cooperative    Lab Results: BMET    Component Value Date/Time   NA 133 (L) 02/16/2016  0803   K 3.6 02/16/2016 0803   CL 94 (L) 02/16/2016 0803   CO2 29 02/16/2016 0803   GLUCOSE 131 (H) 02/16/2016 0803   BUN 10 02/16/2016 0803   CREATININE 0.32 (L) 02/16/2016 0803   CALCIUM 9.8 02/16/2016 0803   GFRNONAA >60 02/16/2016 0803   GFRAA >60 02/16/2016 0803   CBC    Component Value Date/Time   WBC 5.6 02/20/2016 0723   RBC 3.95 02/20/2016 0723   HGB 11.8 (L) 02/20/2016 0723   HCT 36.1 02/20/2016 0723   PLT 304 02/20/2016 0723   MCV 91.4 02/20/2016 0723   MCH 29.9 02/20/2016 0723   MCHC 32.7 02/20/2016 0723   RDW 16.5 (H) 02/20/2016 0723   LYMPHSABS 1.8 02/20/2016 0723   MONOABS 0.7 02/20/2016 0723   EOSABS 0.1 02/20/2016 0723   BASOSABS 0.0 02/20/2016 0723    Studies/Results: Dg Chest Port 1 View  Result Date: 02/19/2016 CLINICAL DATA:  Cough. EXAM: PORTABLE CHEST 1 VIEW COMPARISON:  02/11/2016.  02/03/2016 . FINDINGS: Tracheostomy tube noted with its tip over the trachea. Bibasilar subsegmental atelectasis. No pleural effusion or pneumothorax. Heart size normal. No pneumothorax . IMPRESSION: Bibasilar subsegmental atelectasis and/or scarring. Chest is stable from prior exam. Electronically Signed   By: Marcello Moores  Register   On: 02/19/2016 15:22    Medications: I have reviewed the patient's current medications.  Assessment/Plan:  Functional, mobility  and cognitive/communication deficitssecondary to debility in setting of prior encephalopathy/tetraplegia DVT prophylaxis- Lovenox S/p VDRF s/p trach Mild anemia- stable     Length of stay, days: [place] , MD 02/21/2016, 8:53 AM

## 2016-02-22 ENCOUNTER — Inpatient Hospital Stay (HOSPITAL_COMMUNITY): Payer: BC Managed Care – PPO

## 2016-02-22 NOTE — Progress Notes (Signed)
Occupational Therapy Session Note  Patient Details  Name: Judy Spears MRN: [id number] Date of Birth: Mar 11, 1950  Today's Date: 02/22/2016 OT Individual Time: [id number] OT Individual Time Calculation (min): 45 min   Short Term Goals: Week 3:  OT Short Term Goal 1 (Week 3): STG=LTG due to LOS  Skilled Therapeutic Interventions/Progress Updates: ADL-retraining with focus on improved postural control, family education and training (husband [name] present) and improved RUE gross/FMC.   Pt received with husband at bedside.  OT reviewed plan of care with treatment planned to address sitting balance and NMR of RUE however husband stated he was advised not to assist pt with BADL prior to visit by staff who stated plan for OT to assist with bathing and dressing during this session.  OT accommodated family preference and pt affirmed her desire to wash her hair as well.   Pt performed bed mobility with overall moderate assist using bed rail and HOB elevated.   Pt's husband expressed desire to participate in all BADL including transfers to toilet and wheeled shower chair.   With manual facilitation to weight-shift and place her hands on Stedy, pt rose from sitting to stand using Stedy with Mod assist of 1, +2 provided per family preference.   Pt completed transfers to/from toilet and shower chair again with overal mod assist and intermittent mod assist for postural control.   OT provided wash mit to encourage use of right hand however pt's husband completed most of her toileting, bathing and dressing with skill and efficiency as OT assisted and advised on alternate DME and services.   OT educated husband on sliding transfer bench and use of Certified Aging in Place specialist s/p discharge for home modifications as needed.         Therapy Documentation Precautions:  Precautions Precautions: Fall Precaution Comments: Trach, PEG, supplemental O2, PMSV Required Braces or Orthoses: Other Brace/Splint Other  Brace/Splint: Pt has B AFOs and UE splints Restrictions Weight Bearing Restrictions: No   Pain: Pain Assessment Pain Assessment: Faces Faces Pain Scale: No hurt   See Function Navigator for Current Functional Status.   Therapy/Group: Individual Therapy  [place] 02/22/2016, 12:53 PM

## 2016-02-22 NOTE — Progress Notes (Signed)
Patient ID: Judy Spears, female   DOB: 10/11/1950, 66 y.o.   MRN: [id number]   02/22/2016.  Judy Spears is a 66 y.o. female Who is admitted for CIR with functional mobility and cognitive deficits in the setting of prior encephalopathy and tetraplegia. Subjective: No new complaints.  Remains comfortable and alert.  Afebrile.  Nursing staff, requesting discontinuation of chest percussion.  No pulmonary complaints   Past Medical History:  Diagnosis Date  . Arthritis    lt great toe  . Complication of anesthesia    pt has had headaches post op-did advise to hydra well preop  . Hair loss    right-sided  . History of exercise stress test    a. ETT (12/15) with 12:00 exercise, no ST changes, occasional PACs.   . Hyperlipidemia   . Insomnia   . Migraine headache   . 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 Postmenopausal   . Premature atrial contractions    a. Holter (12/15) with 8% PACs, no atrial runs or atrial fibrillation.     Objective: Vital signs in last 24 hours: Temp:  [97.6 F (36.4 C)-97.7 F (36.5 C)] 97.7 F (36.5 C) (01/28 0624) Pulse Rate:  [57-87] 74 (01/28 0721) Resp:  [16-18] 17 (01/28 0721) BP: (121-131)/(81-91) 131/81 (01/28 0624) SpO2:  [93 %-99 %] 96 % (01/28 0721) Weight:  [115 lb 6.4 oz (52.3 kg)] 115 lb 6.4 oz (52.3 kg) (01/28 0624) Weight change: -3 lb 10.4 oz (-1.655 kg) Last BM Date: 02/20/16  Intake/Output from previous day: No intake/output data recorded. Last cbgs: CBG (last 3)  No results for input(s): GLUCAP in the last 72 hours.   Physical Exam General: No apparent distress   HEENT: Persistent dry, thick secretions in oral pharynx.  Status post tracheostomy Lungs: Normal effort. Lungs clear to auscultation, no crackles or wheezes. Cardiovascular: Regular rate and rhythm, no edema Abdomen: S/NT/ND; BS(+)' status post PEG Musculoskeletal:  unchanged.  Left arm in brace Neurological: No new neurological deficits Wounds:  Bandages noted.  Left foot  Skin: clear  Mental state: Alert, oriented, cooperative    Lab Results: BMET    Component Value Date/Time   NA 133 (L) 02/16/2016 0803   K 3.6 02/16/2016 0803   CL 94 (L) 02/16/2016 0803   CO2 29 02/16/2016 0803   GLUCOSE 131 (H) 02/16/2016 0803   BUN 10 02/16/2016 0803   CREATININE 0.32 (L) 02/16/2016 0803   CALCIUM 9.8 02/16/2016 0803   GFRNONAA >60 02/16/2016 0803   GFRAA >60 02/16/2016 0803   CBC    Component Value Date/Time   WBC 5.6 02/20/2016 0723   RBC 3.95 02/20/2016 0723   HGB 11.8 (L) 02/20/2016 0723   HCT 36.1 02/20/2016 0723   PLT 304 02/20/2016 0723   MCV 91.4 02/20/2016 0723   MCH 29.9 02/20/2016 0723   MCHC 32.7 02/20/2016 0723   RDW 16.5 (H) 02/20/2016 0723   LYMPHSABS 1.8 02/20/2016 0723   MONOABS 0.7 02/20/2016 0723   EOSABS 0.1 02/20/2016 0723   BASOSABS 0.0 02/20/2016 0723    Studies/Results: No results found.  Medications: I have reviewed the patient's current medications.  Assessment/Plan:  Functional cognitive and mobility deficits secondary to prior encephalopathy and tetraplegia Status post VD RF status post tracheostomy DVD prophylaxis.  Continue Lovenox Mild hyponatremia.  Follow-up lab in the morning Mild anemia, stable   Length of stay, days: [place] , MD 02/22/2016, 8:25 AM

## 2016-02-22 NOTE — Progress Notes (Signed)
02/22/16 nursing 1800 Patient vomitted at 2pm  x1 today after meds with 150cc flush. Husband says it is too much. Patient able to tolerate 100cc flush only.

## 2016-02-23 ENCOUNTER — Inpatient Hospital Stay (HOSPITAL_COMMUNITY): Payer: BC Managed Care – PPO | Admitting: Occupational Therapy

## 2016-02-23 ENCOUNTER — Inpatient Hospital Stay (HOSPITAL_COMMUNITY): Payer: BC Managed Care – PPO | Admitting: Speech Pathology

## 2016-02-23 ENCOUNTER — Inpatient Hospital Stay (HOSPITAL_COMMUNITY): Payer: BC Managed Care – PPO | Admitting: Physical Therapy

## 2016-02-23 MED ORDER — DOCUSATE SODIUM 50 MG/5ML PO LIQD
100.0000 mg | Freq: Every day | ORAL | Status: DC
  Administered 2016-02-23 – 2016-02-24 (×2): 100 mg via ORAL
  Filled 2016-02-23 (×2): qty 10

## 2016-02-23 MED ORDER — POLYETHYLENE GLYCOL 3350 17 G PO PACK: 17.0000 g | PACK | Freq: Once | ORAL | Status: DC

## 2016-02-23 MED ORDER — FREE WATER
100.0000 mL | Freq: Every day | Status: DC
  Administered 2016-02-23 – 2016-02-25 (×8): 100 mL

## 2016-02-23 NOTE — Procedures (Signed)
Tracheostomy tube change: Informed verbal consent was obtained after explaining the risks (including bleeding and infection), benefits and alternatives of the procedure. Verbal timeout was performed prior to the procedure. The old  # 6 cuffed trach was carefully removed. the tracheostomy site appeared: clean and unremarkable A new #  4 Cuffless w/ disposable trach was easily placed in the tracheostomy stoma and secured with velcro trach ties. The tracheostomy was patent, good color change observed via EZ-CAP, and the patient was easily able to voice with finger occlusion and tolerated the procedure well with no immediate complications.    Erick Colace ACNP-BC [place] Pager # [phone] OR # [phone] if no answer  Rush Farmer, M.D. [facility] Pulmonary/Critical Care Medicine. Pager: [phone]. After hours pager: [phone].

## 2016-02-23 NOTE — Progress Notes (Signed)
Speech Language Pathology Daily Session Note  Patient Details  Name: Judy Spears MRN: [id number] Date of Birth: February 21, 1950  Today's Date: 02/23/2016 SLP Individual Time: 1300-1400 SLP Individual Time Calculation (min): 60 min  Short Term Goals: Week 3: SLP Short Term Goal 1 (Week 3): Patient will demonstrate intelligibility at the word level 75% of the time with Mod A multimodal cues for use of speech intelligibility strategies.  SLP Short Term Goal 2 (Week 3): Patient will demonstrate intelligibility at the phrase level 25% of the time with Max A multimodal cues for use of speech intelligibility strategies.  SLP Short Term Goal 3 (Week 3): Patient will perform diaphragmatic breathing exercises with Max A multimodal cues.  SLP Short Term Goal 4 (Week 3): Patient will utilize multimodal communication to express wants/needs with Mod A multimodal cues.  SLP Short Term Goal 5 (Week 3): Patient will initiate swallow response of ice chip via spoon in less than 7 seconds with Max A multimodal cues with minimal overt s/s of aspiration.   Skilled Therapeutic Interventions: Skilled treatment session focused on speech and dysphagia goals. Upon arrival, patient was awake while supine in bed with PMSV in place. Patient transferred to the wheelchair with +2 assist via the slideboard. SLP facilitated session by providing Max A verbal and visual cues for utilization of speech intelligibility strategies at the word and phrase level to achieve ~25-50% intelligibility during a functional, informal conversation. Patient also consumed minimal amounts of ice chips. Patient demonstrated timely mastication but demonstrated what appeared to be a delayed swallow initiation. Patient appeared to be holding her breath while masticating and required verbal cues to breathe through her nose while masticating. Recommend continued trials with SLP only. Patient left upright in wheelchair with all needs within reach and caregiver  present. Continue with current plan of care.      Function:  Eating Eating   Modified Consistency Diet: Yes (trials with SLP) Eating Assist Level: Helper feeds patient           Cognition Comprehension Comprehension assist level: Understands basic 50 - 74% of the time/ requires cueing 25 - 49% of the time  Expression   Expression assist level: Expresses basis less than 25% of the time/requires cueing >75% of the time.  Social Interaction Social Interaction assist level: Interacts appropriately less than 25% of the time. May be withdrawn or combative.  Problem Solving Problem solving assist level: Solves basic less than 25% of the time - needs direction nearly all the time or does not effectively solve problems and may need a restraint for safety  Memory Memory assist level: Recognizes or recalls less than 25% of the time/requires cueing greater than 75% of the time    Pain Pain Assessment Faces Pain Scale: No hurt  Therapy/Group: Individual Therapy  Zahari Fazzino 02/23/2016, 2:50 PM

## 2016-02-23 NOTE — Progress Notes (Signed)
Occupational Therapy Session Note  Patient Details  Name: MARAI Spears MRN: [id number] Date of Birth: 1950/03/02  Today's Date: 02/23/2016 OT Individual Time: 1410-1515 OT Individual Time Calculation (min): 65 min    Short Term Goals: Week 3:  OT Short Term Goal 1 (Week 3): STG=LTG due to LOS  Skilled Therapeutic Interventions/Progress Updates:    Pt seen for OT session focusing on functional transfers without use of STEADY in prep for d/c home. Pt sitting up in w/c upon arrival, personal care attendant [name] present. Pt agreeable to tx session. Spent time discussing home bathroom layout and problem solving transfer methods for w/c <> toilet.  Completed squat pivot transfers in therapy gym in simulation of transfer. Emphasis on pt leaning forward into caregiver for anterior weight shift to de-load weight from buttock and completing squat pivot transfer. +2 utilized for safety as they will have 2 personal care attendants present at d/c when completing tasks without STEADY. Practiced pt sit <> stands from w/c and therapy mat, mod-max A to stand. Pt able to tolerate stand with +1 to maintain upright position while second person simulated doing toileting tasks. Completed multiple trials with seated rest breaks provided btwn trials. Educated extensively throughout session regarding planning for good and bad days based on pt's day to day abilities and ensuring proper body mechanics to decreased caregiver burden and decrease risk of injury.  Pt returned to room with assist from family.   Therapy Documentation Precautions:  Precautions Precautions: Fall Precaution Comments: Trach, PEG, PMSV Required Braces or Orthoses: Other Brace/Splint Other Brace/Splint: Pt has B AFOs and UE splints Restrictions Weight Bearing Restrictions: No Pain:   No/ denies pain  See Function Navigator for Current Functional Status.   Therapy/Group: Individual Therapy  [name], [name] 02/23/2016, 7:26 AM

## 2016-02-23 NOTE — Progress Notes (Signed)
Physical Therapy Session Note  Patient Details  Name: Judy Spears MRN: [id number] Date of Birth: 1950-09-28  Today's Date: 02/23/2016 PT Individual Time: 0930-1100 PT Individual Time Calculation (min): 90 min   Short Term Goals: Week 3:  PT Short Term Goal 1 (Week 3): =LTG due to estimated length of stay  Skilled Therapeutic Interventions/Progress Updates: Pt received seated in w/c with husband present, denies pain and agreeable to treatment. Performed squat pivot transfer w/c <>mat table maxA for energy/time conservation. Sit <>stand from elevated mat table with platform RW and maxA. Verbal/tactile cues for B knee/hip extension and anterior weight shift. RW repositioned to facilitate anterior/R lateral weight shift. Standing frame x10 min for BLE ROM, mini-squats and glute sets for carryover into sit <>stand with stedy. Sit <>supine maxA. Supine>prone maxA +2. Prone lying x10 min with BLE PROM to quads/hip flexors; pt reports feeling comfortable in prone, does report stretching sensation in hip flexors/back. Discussed with husband and caregiver benefits of prone lying for ROM to counter long-term sitting in w/c, bed. Returned to w/c as above at end of session, remained with family to transport back to room at end of session.      Therapy Documentation Precautions:  Precautions Precautions: Fall Precaution Comments: Trach, PEG, supplemental O2, PMSV Required Braces or Orthoses: Other Brace/Splint Other Brace/Splint: Pt has B AFOs and UE splints Restrictions Weight Bearing Restrictions: No   See Function Navigator for Current Functional Status.   Therapy/Group: Individual Therapy  Luberta Mutter 02/23/2016, 12:12 PM

## 2016-02-23 NOTE — Progress Notes (Signed)
[facility] PCCM  S:  Doing well.  For trach change O:. Vitals:   02/23/16 0637 02/23/16 0853 02/23/16 1149 02/23/16 1552  BP: 133/79     Pulse: 85     Resp: 18     Temp: 98.7 F (37.1 C)     TempSrc: Oral     SpO2: 96% 97% 99% 99%  Weight: 118 lb (53.5 kg)     Height:       RA  On exam Gen: no distress. Tries to communicate HENT: NCAT, no JVD. Good phonation noise w/ both 6 and then 4 cuffless  Pulm: clear and w/out accessory use  Card:RRR  Abd: soft not tender    Impression/plan  Tracheostomy status> now doing well, minimal secretions but remains relatively high risk for recurrent respiratory failure or pneumonia.   ->downsized to #4 Plan Cap during day PRN w/ either red occlusive cap OR PMV ROV 12 weeks. 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  Came in and was emergently trached in the ED.  On exam, much more alert and interactive, off vent with a size 6 cuffless trach.  I reviewed CXR myself, trach is in good position.  Discussed with PCCM-NP.  Trach status:  - Change to a cuffless 4 trach - PCCM will change trach  - Return to trach clinic in 12 weeks  Acute on chronic respiratory failure:  - Titrate O2 for sat of 88-92%.  Pneumonia:  - Abx course complete.  PCCM will sign off, please call back if needed.  Patient seen and examined, agree with above note.  I dictated the care and orders written for this patient under my direction.  Rush Farmer, MD [phone]

## 2016-02-23 NOTE — Progress Notes (Signed)
[place] PHYSICAL MEDICINE & REHABILITATION     PROGRESS NOTE    Subjective/Complaints: Lying in bed. Awake. Denies any problems. Apparently having some difficulties holding in water boluses.   ROS: Limited due cognitive/behavioral       Objective: Vital Signs: Blood pressure 133/79, pulse 85, temperature 98.7 F (37.1 C), temperature source Oral, resp. rate 18, height 5\' 4"  (1.626 m), weight 53.5 kg (118 lb), last menstrual period 12/01/2010, SpO2 99 %. No results found. No results for input(s): WBC, HGB, HCT, PLT in the last 72 hours. No results for input(s): NA, K, CL, GLUCOSE, BUN, CREATININE, CALCIUM in the last 72 hours.  Invalid input(s): CO CBG (last 3)  No results for input(s): GLUCAP in the last 72 hours.  Wt Readings from Last 3 Encounters:  02/23/16 53.5 kg (118 lb)  02/03/16 52.2 kg (115 lb)  01/04/16 56.6 kg (124 lb 12.5 oz)    Physical Exam:  Constitutional: She appears well-developed.   NAD. HENT: Normocephalicand atraumatic.  Eyes: No discharge, EOM appear intact Neck: #6 trach clean/area inatct.   Cardiovascular: RRR Respiratory: CTA Bilaterally GI: BS+, NT, ND.  PEG site remains clean and dry. Musculoskeletal: She exhibits no edema, no tenderness.  Neurological: She is alert and making good eye contact MAS:  LUE and LLE. LUE 1-2/4. LLE now 1-2/4.  .   -elbow with PROM to only up to 130 today  -pt is actively extending left knee to almost 180 degrees stil     Motor: 3- to 3/5 RUE and RLE  2-3/5 LUE and LLE Skin: Skin is warmand dry.  rash on buttocks/sacrum nearly resolved Psychiatric: alert and engages  Assessment/Plan: 1. Functional, cognitive, and mobility deficits secondary to encephalopathy/debility which require 3+ hours per day of interdisciplinary therapy in a comprehensive inpatient rehab setting. Physiatrist is providing close team supervision and 24 hour management of active medical problems listed below. Physiatrist and  rehab team continue to assess barriers to discharge/monitor patient progress toward functional and medical goals.  Function:  Bathing Bathing position   Position: Other (comment) (wheeled shower chair)  Bathing parts   Body parts bathed by helper: Right arm, Left arm, Chest, Abdomen, Front perineal area, Buttocks, Right upper leg, Left upper leg, Right lower leg, Left lower leg, Back  Bathing assist Assist Level: 2 helpers      Upper Body Dressing/Undressing Upper body dressing   What is the patient wearing?: Bra, Pull over shirt/dress Bra - Perfomed by patient: Thread/unthread right bra strap, Thread/unthread left bra strap Bra - Perfomed by helper: Hook/unhook bra (pull down sports bra)   Pull over shirt/dress - Perfomed by helper: Thread/unthread right sleeve, Thread/unthread left sleeve, Put head through opening, Pull shirt over trunk        Upper body assist Assist Level: 2 helpers      Lower Body Dressing/Undressing Lower body dressing   What is the patient wearing?: Pants, Socks, Shoes       Pants- Performed by helper: Thread/unthread right pants leg, Thread/unthread left pants leg, Pull pants up/down, Fasten/unfasten pants       Socks - Performed by helper: Don/doff right sock, Don/doff left sock   Shoes - Performed by helper: Don/doff right shoe, Don/doff left shoe, Fasten right, Fasten left          Lower body assist Assist for lower body dressing: 2 Automotive engineer activity did not occur: No continent bowel/bladder event   Toileting steps completed  by helper: Adjust clothing prior to toileting, Performs perineal hygiene, Adjust clothing after toileting Toileting Assistive Devices: Other (comment) (Standing in STEADY)  Toileting assist Assist level: Two helpers   Transfers Chair/bed transfer   Chair/bed transfer method: Squat pivot Chair/bed transfer assist level: Total assist (Pt < 25%) Chair/bed transfer assistive device:  Mechanical lift Mechanical lift: Stedy   Locomotion Ambulation Ambulation activity did not occur: Safety/medical concerns         Wheelchair   Type: Manual   Assist Level: Dependent (Pt equals 0%)  Cognition Comprehension Comprehension assist level: Understands basic 50 - 74% of the time/ requires cueing 25 - 49% of the time  Expression Expression assist level: Expresses basis less than 25% of the time/requires cueing >75% of the time.  Social Interaction Social Interaction assist level: Interacts appropriately less than 25% of the time. May be withdrawn or combative.  Problem Solving Problem solving assist level: Solves basic less than 25% of the time - needs direction nearly all the time or does not effectively solve problems and may need a restraint for safety  Memory Memory assist level: Recognizes or recalls less than 25% of the time/requires cueing greater than 75% of the time   Medical Problem List and Plan: 1. Functional, mobility and cognitive/communication deficitssecondary to debility in setting of prior encephalopathy/tetraplegia  -continue CIR PT, OT,  SLP  -pt is engaging/interacting more and more with staff,family.  2. DVT Prophylaxis/Anticoagulation: Pharmaceutical: Lovenox 3. Pain Management: tylenol prn 4. Mood: LCSW to follow for evaluation when appropriate  -no changes 5. Neuropsych: This patient is notcapable of making decisions on herown behalf. 6. Skin/Wound Care: routine pressure relief measures, local care for rash 7. Fluids/Electrolytes/Nutrition:  TF formula changed to Vital 1.5---appreciate RD assistance  -hopefully this will help with absorbance and distention  -can decrease water flushes also to help 8. Spasticity: On baclofen qid and valium.  -now off day time valium. Reduced valium to 4mg  qhs--reduce further as tolerated.   -will hold off on resuming klonopin at this time  -consider dantrium trial   -resting left elbow splint per  [facility]      -need to keep pushing beyond 130 deg +    -should wear after therapies/evening -splinting, dynamic left elbow, knee.  -ROM with therapy -botox injection performed 1/12 --beginning to see progress as pt initiating more use of her left arm and leg. Actually used left hand to rub her nose while I was in the room. Between therapy/splinting should see further gains while on inpatient rehab. Hope to improve transfers/basic mobility  -consider resting knee brace LLE 9. Candida UTI/C diff colitis: tested + 12/27. has completed treatment on 01/31/16.   -continue contact precautions for c diff thru 03/01/16 if here 10 VDRF: now with cuffless #6 trach.  tolerating PMV   -  nebulizers prn and mucinex to loosen secretions.     -continue nebs, chest pt, OOB, suction  -sputum cx with pseudomonas-observation only  -CCS will downsize to #4 trach today   11. ABLA  Hb 11.8 today  Cont to monitor    LOS (Days) 20 A FACE TO FACE EVALUATION WAS PERFORMED  Meredith Staggers, MD 02/23/2016 12:45 PM

## 2016-02-24 ENCOUNTER — Inpatient Hospital Stay (HOSPITAL_COMMUNITY): Payer: BC Managed Care – PPO | Admitting: Physical Therapy

## 2016-02-24 ENCOUNTER — Inpatient Hospital Stay (HOSPITAL_COMMUNITY): Payer: BC Managed Care – PPO | Admitting: Occupational Therapy

## 2016-02-24 ENCOUNTER — Inpatient Hospital Stay (HOSPITAL_COMMUNITY): Payer: BC Managed Care – PPO | Admitting: Speech Pathology

## 2016-02-24 LAB — GLUCOSE, CAPILLARY: Glucose-Capillary: 88 mg/dL (ref 65–99)

## 2016-02-24 LAB — BASIC METABOLIC PANEL
ANION GAP: 10 (ref 5–15)
BUN: 16 mg/dL (ref 6–20)
CALCIUM: 9.4 mg/dL (ref 8.9–10.3)
CHLORIDE: 99 mmol/L — AB (ref 101–111)
CO2: 26 mmol/L (ref 22–32)
Creatinine, Ser: 0.36 mg/dL — ABNORMAL LOW (ref 0.44–1.00)
GFR calc Af Amer: >60 mL/min (ref >60)
GFR calc non Af Amer: >60 mL/min (ref >60)
GLUCOSE: 132 mg/dL — AB (ref 65–99)
Potassium: 3.6 mmol/L (ref 3.5–5.1)
Sodium: 135 mmol/L (ref 135–145)

## 2016-02-24 MED ORDER — JEVITY 1.2 CAL PO LIQD
ORAL | Status: AC
  Filled 2016-02-24: qty 237

## 2016-02-24 MED ORDER — POLYETHYLENE GLYCOL 3350 17 G PO PACK
17.0000 g | PACK | Freq: Once | ORAL | Status: AC
  Administered 2016-02-24: 17 g via ORAL
  Filled 2016-02-24: qty 1

## 2016-02-24 MED ORDER — DOCUSATE SODIUM 50 MG/5ML PO LIQD
100.0000 mg | Freq: Two times a day (BID) | ORAL | Status: DC
  Administered 2016-02-24 – 2016-02-25 (×3): 100 mg via ORAL
  Filled 2016-02-24 (×2): qty 10

## 2016-02-24 MED ORDER — DIAZEPAM 2 MG PO TABS
2.0000 mg | ORAL_TABLET | Freq: Every day | ORAL | Status: DC
  Administered 2016-02-24: 2 mg
  Filled 2016-02-24: qty 1

## 2016-02-24 NOTE — Progress Notes (Signed)
Speech Language Pathology Session Note & Discharge Summary  Patient Details  Name: JOSELY MOFFAT MRN: [id number] Date of Birth: 09-18-1950  Today's Date: 02/24/2016 SLP Individual Time: 1000-1100 SLP Individual Time Calculation (min): 60 min   Skilled Therapeutic Interventions: Skilled treatment session focused on dysphagia and speech goals.  Upon arrival, patient had a #4 cuffless trach with PMSV in place with all vitals remaining [facility] throughout session. SLP facilitated session by providing thorough oral care via the suction toothbrush. Patient consumed trials of ice chips and demonstrated timely mastication with what appeared to be a delayed swallow initiation and a subtle throat clear in 25% of trials. However, patient with a clear vocal quality throughout and initiated a second swallow on command in 100% of opportunities. Recommend continued trials of ice chips with SLP and trained caregivers after thorough oral care. Patient also verbalized at the phrase and sentence level with Max A verbal cues needed for use of speech intelligibility strategies to achieve ~75% intelligibility. Patient, family and caregiver education is complete and all questions were answered at this time. Patient left upright in wheelchair with caregiver present. Continue with current plan of care.   Patient has met 3 of 3 long term goals.  Patient to discharge at overall Max level.   Reasons goals not met: N/A   Clinical Impression/Discharge Summary:  Patient has made gains and has met 3 of 3 LTG's this admission. Currently, patient has a #4 cuffless trach and is wearing her PMSV during all waking hours with vitals remaninig WFL. Patient had just recently initiated trials of ice chips due to improved respiratory status but is demonstrating a suspected delayed swallow initiation with intermittent subtle throat clearing. Patient demonstrates improved ability to swallow on command and can initiate multiple swallows in 100% of  opportunities with extra time. Patient continues to require overall Max A multimodal cues for utilization of speech intelligibility strategies to achieve ~25% intelligibility at the phrase level in regards to expression of wants/needs. Multimodal communication was initiated in regards to a large letter board and a laser attached to her glasses in order to scan and locate letters for spelling at the word level to maximize functional communication.  Recommend continued practice utilizing multimodal communication with possible AAC consult in order to maximize her overall functional communication. Patient and family education is complete and patient will discharge home with 24 hour supervision. Patient would benefit from f/u outpatient SLP services to maximize her functional communication and swallowing function in order to reduce caregiver burden.   Care Partner:  Caregiver Able to Provide Assistance: Yes  Type of Caregiver Assistance: Physical;Cognitive  Recommendation:  24 hour supervision/assistance;Outpatient SLP  Rationale for SLP Follow Up: Maximize functional communication;Maximize cognitive function and independence;Maximize swallowing safety;Reduce caregiver burden   Equipment: suction    Reasons for discharge: Treatment goals met;Discharged from hospital   Patient/Family Agrees with Progress Made and Goals Achieved: Yes   Function:  Eating Eating   Modified Consistency Diet: Yes (with trials from SLP ) Eating Assist Level: Helper feeds patient (with trials from SLP )           Cognition Comprehension Comprehension assist level: Understands basic 50 - 74% of the time/ requires cueing 25 - 49% of the time  Expression Expression assistive device: Talk trach valve Expression assist level: Expresses basic 25 - 49% of the time/requires cueing 50 - 75% of the time. Uses single words/gestures.  Social Interaction Social Interaction assist level: Interacts appropriately 75 - 89% of  the  time - Needs redirection for appropriate language or to initiate interaction.  Problem Solving Problem solving assist level: Solves basic less than 25% of the time - needs direction nearly all the time or does not effectively solve problems and may need a restraint for safety  Memory Memory assist level: Recognizes or recalls less than 25% of the time/requires cueing greater than 75% of the time   Kasondra Junod 02/24/2016, 12:49 PM

## 2016-02-24 NOTE — Progress Notes (Signed)
Nurse called on call Md, Danella Sensing in regards to patient emesis and feeling sick. MD inquired on patient's current status and meaning of sick. Nurse asked patient if patient was feeling full or nauseous. Patient stated of feeling full and nauseous. Nurse inquired with patient if we can resume feed at a slower rate, patient stated of still feeling sick and full. Per on Md, hold for the night and obtain blood glucose now and recheck mid-shift to ensure no episodes of hypoglycemia, in addition administer PRN antiemetic. (2240 02/24/16) Per tech, Crystal, blood glucose 88. Will recheck later on.

## 2016-02-24 NOTE — Progress Notes (Signed)
Occupational Therapy Session Note  Patient Details  Name: Judy Spears MRN: [id number] Date of Birth: [birth year]  Today's Date: 02/24/2016 OT Individual Time: 0300-9233 OT Individual Time Calculation (min): 45 min    Short Term Goals: Week 1:  OT Short Term Goal 1 (Week 1): Will initiate timed toileting for skin protection OT Short Term Goal 1 - Progress (Week 1): Not met OT Short Term Goal 2 (Week 1): Pt will initate forward lean during transfer with mod A and VCs OT Short Term Goal 2 - Progress (Week 1): Met OT Short Term Goal 3 (Week 1): Pt will complete low reach grasping with mod A in pre function task OT Short Term Goal 3 - Progress (Week 1): Met Week 2:  OT Short Term Goal 1 (Week 2): Pt will reach and grasp for grab bars of STEADY with min A and VCs in prep for functional transfers OT Short Term Goal 1 - Progress (Week 2): Met OT Short Term Goal 2 (Week 2): Pt will maintain midline sitting for 3 minutes with assist for hand support only and supervision in prep for functional sitting task OT Short Term Goal 2 - Progress (Week 2): Progressing toward goal OT Short Term Goal 3 (Week 2): Pt will complete transfer via STEADY with max- total A +1 in order to reduce caregiver burden OT Short Term Goal 3 - Progress (Week 2): Met Week 3:  OT Short Term Goal 1 (Week 3): STG=LTG due to LOS      Skilled Therapeutic Interventions/Progress Updates:    Pt seen this session for education with pt's caregiver, [name] and with pt on safe techniques with toilet transfers.  Bathroom environment set up to simulate set up at home.  Asked [name] to be the lead in the transfer, giving this therapist instructions to ensure she felt comfortable educating the other caregiver.  Provided [name] with suggestions for hand placement and body mechanics to make it easier for her and to facilitate the patient's ability to transfer. Recommended a clamp- on tub grab bar on edge of tub to allow pt to assist with pulling  herself up.  Discussed a drop arm commode to go over toilet versus a clamp on toilet seat. Suggested the drop arm BSC would give them more options and be more comfortable. Photos of recommended equipment provided to family.  [name] and pt were able to complete squat pivot with therapist assisting at hip level for guidance.  Overall, pt transferred with max A on and off toilet. Pt in room with caregiver and all needs met.   Therapy Documentation Precautions:  Precautions Precautions: Fall Precaution Comments: Trach, PEG, supplemental O2, PMSV Required Braces or Orthoses: Other Brace/Splint Other Brace/Splint: Pt has B AFOs and UE splints Restrictions Weight Bearing Restrictions: No    Vital Signs: Therapy Vitals Temp: 98.5 F (36.9 C) Temp Source: Oral Pulse Rate: 83 Resp: 18 BP: 135/77 Patient Position (if appropriate): Lying Oxygen Therapy SpO2: 99 % O2 Device: Not Delivered   Pain: no c/o pain   ADL:    See Function Navigator for Current Functional Status.   Therapy/Group: Individual Therapy  [place] 02/24/2016, 8:20 AM

## 2016-02-24 NOTE — Progress Notes (Signed)
Nutrition Follow-up  DOCUMENTATION CODES:   Not applicable  INTERVENTION:   Provide nocturnal tube feeds of Vital 1.5 formula via PEG at goal rate of 115 ml/hr x 13 hours (6pm-7am) which provides 2242 kcal, 101 grams of protein, and 1136 ml of free water.   Continue free water flushes of 100 ml given 5 times daily. Total free water: 1636 ml.  RD to continue to monitor.   NUTRITION DIAGNOSIS:   Increased nutrient needs related to chronic illness as evidenced by estimated needs.  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 Hospital course significant for vent dependence with need for recanalization of trach and decline in MS due to refractory seizures despite multiple AEDs. She was transferred to [facility] on 12/10 for management and monitoring. 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 She was weaned to [facility] on 01/1 and tolerating PMSV.  Attempted to visit patient, however patient was unavailable. Nurse was busy in the room. Continue with current tube feeding orders. Per doctors note, patient was not tolerating water bolus well. MD changed water bolus to 100 mL 5 times per day. Per chart, patient requested water. Noted patient is still NPO. RD will continue to monitor hydration status.   Labs Reviewed: Glucose 132   Diet Order:  Diet  NPO time specified  Skin:  Reviewed, no issues  Last BM:  1/26  Height:   Ht Readings from Last 1 Encounters:  02/03/16 _0  (1.626 m)    Weight:   Wt Readings from Last 1 Encounters:  02/24/16 120 lb 7.4 oz (54.6 kg)    Ideal Body Weight:  54.5 kg  BMI:  Body mass index is 20.68 kg/m.  Estimated Nutritional Needs:   Kcal:  2000-2200  Protein:  95-115 grams minimum  Fluid:  2-2.1 L/day  EDUCATION NEEDS:   No education needs identified at this time  Juliann Pulse M.S. Nutrition Dietetic Intern

## 2016-02-24 NOTE — Progress Notes (Signed)
Occupational Therapy Discharge Summary  Patient Details  Name: Judy Spears MRN: [id number] Date of Birth: 1950/02/11   Patient has met 4 of 4 long term goals due to improved activity tolerance, improved balance, postural control, ability to compensate for deficits, functional use of  RIGHT upper and LEFT upper extremity and improved coordination.  Patient to discharge at [facility] Max Assist level.  Patient's care partner is independent to provide the necessary physical assistance at discharge. Pt's personal care attendant and husband present during most sessions and provided hands on assist throughout. Pt total A +1 at baseline and following 3 weeks on IPR pt now once again total A +1 with caregiver and husband demonstrating ability to provide needed care. Pt to d/c home and return to outpatient services as done PTA.    Recommendation:  Patient will benefit from ongoing skilled OT services in outpatient setting to continue to advance functional skills in the area of BADL and Reduce care partner burden.  Equipment: Pt has all needed DME  Reasons for discharge: treatment goals met and discharge from hospital  Patient/family agrees with progress made and goals achieved: Yes  OT Discharge Precautions/Restrictions  Precautions Precautions: Fall Precaution Comments: Lurline Idol, PEG, PMSV Required Braces or Orthoses: Other Brace/Splint Other Brace/Splint: Pt has B AFOs and UE splints Restrictions Weight Bearing Restrictions: No Vision/Perception  Vision- History Baseline Vision/History: Wears glasses Wears Glasses: At all times Patient Visual Report: Other (comment) (Prism glasses) Vision- Assessment Vision Assessment?: Vision impaired- to be further tested in functional context Additional Comments: Pt demonstrates over/undershooting when reaching, improved with use of prism glasses  Cognition Overall Cognitive Status: History of cognitive impairments - at baseline Arousal/Alertness:  Awake/alert Orientation Level: Oriented to person;Oriented to place Attention: Focused;Sustained Focused Attention: Appears intact Sustained Attention: Impaired Sustained Attention Impairment: Verbal basic;Functional basic Memory: Impaired Awareness: Impaired Awareness Impairment: Emergent impairment;Intellectual impairment Problem Solving: Impaired Problem Solving Impairment: Verbal basic;Functional basic Safety/Judgment: Impaired Comments: Pt with hx of enpehalopathy; non-verbal; answers yes/no questions with head nod ~25% of time Sensation Sensation Proprioception: Impaired Detail Proprioception Impaired Details: Impaired RLE;Impaired LLE Coordination Gross Motor Movements are Fluid and Coordinated: No Fine Motor Movements are Fluid and Coordinated: No Coordination and Movement Description: Movement/ coordination impaired due to tone and baseline encephalopathy. Improved since admission with use of Botox by MD and static/ dynamic UE and LE braces/ splints Motor  Motor Motor: Abnormal postural alignment and control;Abnormal tone;Primitive reflexes present Trunk/Postural Assessment  Cervical Assessment Cervical Assessment: Exceptions to [facility] (Limited cervical ROM, L head tilt) Thoracic Assessment Thoracic Assessment: Exceptions to [facility] (L lateral scoliosis, rounded shoulders) Lumbar Assessment Lumbar Assessment: Exceptions to [facility] (Posterior pelvic tilt; L hip raise) Postural Control Head Control: Lateral lean to L requiring head support in chair Trunk Control: Limited anterior leaning abilities; L lean  Balance Balance Balance Assessed: Yes Static Sitting Balance Static Sitting - Balance Support: Feet supported Static Sitting - Level of Assistance: 5: Stand by assistance;4: Min assist Static Sitting - Comment/# of Minutes: Sitting EOM with B UEs in lap Dynamic Sitting Balance Dynamic Sitting - Balance Support: Feet supported Dynamic Sitting - Level of Assistance: 4: Min  assist;3: Mod assist Sitting balance - Comments: Sitting EOM Static Standing Balance Static Standing - Balance Support: Bilateral upper extremity supported Static Standing - Level of Assistance: 2: Max assist Static Standing - Comment/# of Minutes: +1 from w/c with UEs extended over therapist Extremity/Trunk Assessment RUE Assessment RUE Assessment: Exceptions to [facility] RUE PROM (degrees) Overall PROM Right Upper  Extremity: Deficits;Due to premorbid status RUE Strength RUE Overall Strength: Deficits;Due to premorbid status RUE Overall Strength Comments: Impaired due to hx of encephalopathy; limited shoulder flexion and elbow extension LUE Assessment LUE Assessment: Exceptions to [facility] LUE Strength LUE Overall Strength: Deficits;Due to premorbid status LUE Overall Strength Comments: Roughly 2+/5 throughout,  Left Hand Gross Grasp: Impaired (Limited control/ awareness of grasping position, however, strong grasp strength when positioned correctly for functional transfers (i.e. on STEADY)) LUE Tone LUE Tone: Hypertonic LUE Tone Comments: Pt with synergistic patterns, esp at night. Splint provided by [employer] for daytime/ night time use for stretching/ ROM, pt tolerates well   See Function Navigator for Current Functional Status.  Bobby Rumpf, Rani Idler C 02/24/2016, 4:34 PM

## 2016-02-24 NOTE — Progress Notes (Signed)
[facility] [place] PHYSICAL MEDICINE & REHABILITATION     PROGRESS NOTE    Subjective/Complaints: In bed. Very alert. "want water"  ROS: Limited due cognitive/behavioral        Objective: Vital Signs: Blood pressure 135/77, pulse 83, temperature 98.5 F (36.9 C), temperature source Oral, resp. rate 18, height 5\' 4"  (1.626 m), weight 54.6 kg (120 lb 7.4 oz), last menstrual period 12/01/2010, SpO2 99 %. No results found. No results for input(s): WBC, HGB, HCT, PLT in the last 72 hours.  Recent Labs  02/24/16 0707  NA 135  K 3.6  CL 99*  GLUCOSE 132*  BUN 16  CREATININE 0.36*  CALCIUM 9.4   CBG (last 3)  No results for input(s): GLUCAP in the last 72 hours.  Wt Readings from Last 3 Encounters:  02/24/16 54.6 kg (120 lb 7.4 oz)  02/03/16 52.2 kg (115 lb)  01/04/16 56.6 kg (124 lb 12.5 oz)    Physical Exam:  Constitutional: She appears well-developed.   NAD. HENT: Normocephalicand atraumatic.  Eyes: No discharge, EOM appear intact Neck: #4 trach with PMV.   Cardiovascular: RRR Respiratory: CTA B GI: BS+, NT, ND.  PEG site remains clean and dry. Musculoskeletal: She exhibits no edema, no tenderness.  Neurological: She is alert and making good eye contact MAS:  LUE and LLE. LUE 1-2/4. LLE now 1-2/4.     -elbow with PROM at130 today  -pt continues to actively extend  left knee to almost 180 degrees       Motor: 3- to 3/5 RUE and RLE  2-3/5 LUE and LLE Skin: Skin is warmand dry.  rash on buttocks/sacrum nearly resolved Psychiatric: alert and engages  Assessment/Plan: 1. Functional, cognitive, and mobility deficits secondary to encephalopathy/debility which require 3+ hours per day of interdisciplinary therapy in a comprehensive inpatient rehab setting. Physiatrist is providing close team supervision and 24 hour management of active medical problems listed below. Physiatrist and rehab team continue to assess barriers to discharge/monitor patient progress toward  functional and medical goals.  Function:  Bathing Bathing position   Position: Other (comment) (wheeled shower chair)  Bathing parts   Body parts bathed by helper: Right arm, Left arm, Chest, Abdomen, Front perineal area, Buttocks, Right upper leg, Left upper leg, Right lower leg, Left lower leg, Back  Bathing assist Assist Level: 2 helpers      Upper Body Dressing/Undressing Upper body dressing   What is the patient wearing?: Bra, Pull over shirt/dress Bra - Perfomed by patient: Thread/unthread right bra strap, Thread/unthread left bra strap Bra - Perfomed by helper: Hook/unhook bra (pull down sports bra)   Pull over shirt/dress - Perfomed by helper: Thread/unthread right sleeve, Thread/unthread left sleeve, Put head through opening, Pull shirt over trunk        Upper body assist Assist Level: 2 helpers      Lower Body Dressing/Undressing Lower body dressing   What is the patient wearing?: Pants, Socks, Shoes       Pants- Performed by helper: Thread/unthread right pants leg, Thread/unthread left pants leg, Pull pants up/down, Fasten/unfasten pants       Socks - Performed by helper: Don/doff right sock, Don/doff left sock   Shoes - Performed by helper: Don/doff right shoe, Don/doff left shoe, Fasten right, Fasten left          Lower body assist Assist for lower body dressing: 2 Helpers      Toileting Toileting Toileting activity did not occur: No continent bowel/bladder event  Toileting steps completed by helper: Adjust clothing prior to toileting, Performs perineal hygiene, Adjust clothing after toileting Toileting Assistive Devices: Other (comment) (Steady)  Toileting assist Assist level: Two helpers   Transfers Chair/bed transfer   Chair/bed transfer method: Squat pivot Chair/bed transfer assist level: Total assist (Pt < 25%) Chair/bed transfer assistive device: Mechanical lift Mechanical lift: Stedy   Locomotion Ambulation Ambulation activity did not  occur: Safety/medical concerns         Wheelchair   Type: Manual   Assist Level: Dependent (Pt equals 0%)  Cognition Comprehension Comprehension assist level: Understands basic 50 - 74% of the time/ requires cueing 25 - 49% of the time  Expression Expression assist level: Expresses basis less than 25% of the time/requires cueing >75% of the time.  Social Interaction Social Interaction assist level: Interacts appropriately less than 25% of the time. May be withdrawn or combative.  Problem Solving Problem solving assist level: Solves basic less than 25% of the time - needs direction nearly all the time or does not effectively solve problems and may need a restraint for safety  Memory Memory assist level: Recognizes or recalls less than 25% of the time/requires cueing greater than 75% of the time   Medical Problem List and Plan: 1. Functional, mobility and cognitive/communication deficitssecondary to debility in setting of prior encephalopathy/tetraplegia  -continue CIR PT, OT,  SLP  -pt is improving daily from a cognitive/arousal standpoint.  2. DVT Prophylaxis/Anticoagulation: Pharmaceutical: Lovenox 3. Pain Management: tylenol prn 4. Mood: LCSW to follow for evaluation when appropriate  -no changes 5. Neuropsych: This patient is notcapable of making decisions on herown behalf. 6. Skin/Wound Care: routine pressure relief measures, local care for rash 7. Fluids/Electrolytes/Nutrition:  TF formula changed to Vital 1.5---appreciate RD assistance  -decreased water flushes also to help 8. Spasticity: On baclofen qid and valium.  -now off day time valium. Reduced valium to 4mg  qhs--reduce further to 2.5mg .   -will hold off on resuming klonopin at this time  -consider dantrium trial   -resting left elbow splint per [employer]      -continue to push elbow beyond 130 deg +    -should wear after therapies/evening -splinting, dynamic left elbow, knee.  -ROM  with therapy -botox injection performed 1/12 --beginning to see progress as pt initiating more use of her left arm and leg. Actually used left hand to rub her nose while I was in the room. Between therapy/splinting should see further gains while on inpatient rehab. Hope to improve transfers/basic mobility  -consider resting knee brace LLE 9. Candida UTI/C diff colitis: tested + 12/27. has completed treatment on 01/31/16.   -continue contact precautions for c diff thru 03/01/16 if here 10 VDRF: now with cuffless #4 trach.  tolerating PMV   -  nebulizers prn and mucinex to loosen secretions.     -continue nebs, chest pt, OOB, suction  -CCS following  11. ABLA  Hb 11.8    Cont to monitor    LOS (Days) 21 A FACE TO FACE EVALUATION WAS PERFORMED  Meredith Staggers, MD 02/24/2016 9:56 AM

## 2016-02-24 NOTE — Progress Notes (Signed)
Feeding put on hold due to medication administration. During this time after med administration and patient care, patient had a large episode of emesis with feeding content emesis. Linens and clothing changed. When asked if patient still feeling a sick, patient stated still feeling a little sick and guarding abdomen. Bowels auscultated in all quadrants active with abdomen soft, round and non tender.

## 2016-02-24 NOTE — Progress Notes (Signed)
Occupational Therapy Session Note  Patient Details  Name: Judy Spears MRN: [id number] Date of Birth: 09-Apr-1950  Today's Date: 02/24/2016 OT Individual Time: 1300-1400 OT Individual Time Calculation (min): 60 min    Short Term Goals: Week 3:  OT Short Term Goal 1 (Week 3): STG=LTG due to LOS  Skilled Therapeutic Interventions/Progress Updates:    Pt seen for OT ADL bathing/dressing session. Pt sitting up in w/c upon arrival, personal care attendant present. Pt declining wanting to practice shower transfer/ showering in ADL apartment in home environment, she instead desired to shower in room utilizing roll in shower chair.  Pt stood with max A from w/c with therapist while caregiver completed clothing management in prep for shower. STEADY used to transfer to roll in shower chair. She showered with total A in sitting, QRB donned around waist for safety. Pt verbalized need for toileting task after shower. Pt left seated on toilet at end of session, personal care attendant present.  Throughout session, pt able to stand from STEADY seat with guarding assist, required mod-max to stand into STEADY from w/c. She was able to maintain standing position while in STEADY for caregiver to be able to complete clothing management etc.  Caregiver provided with handouts of optional DME for bathroom use as discussed in previous sessions in order to increase safety and independence with toileting tasks, shower transfers, and toilet transfers. Personal care attendant voiced feeling confident and ready for planned d/c home tomorrow.   Therapy Documentation Precautions:  Precautions Precautions: Fall Precaution Comments: Trach, PEG, supplemental O2, PMSV Required Braces or Orthoses: Other Brace/Splint Other Brace/Splint: Pt has B AFOs and UE splints Restrictions Weight Bearing Restrictions: No Pain:   No/ denies pain  See Function Navigator for Current Functional Status.   Therapy/Group: Individual  Therapy  [name], Tonique Mendonca C 02/24/2016, 6:56 AM

## 2016-02-24 NOTE — Progress Notes (Signed)
Physical Therapy Discharge Summary  Patient Details  Name: Judy Spears MRN: [id number] Date of Birth: 21-Sep-1950  Today's Date: 02/27/2016   Patient has met 4 of 4 long term goals due to improved activity tolerance, improved balance, improved postural control, increased strength, increased range of motion, decreased pain, ability to compensate for deficits, functional use of  right upper extremity, right lower extremity, left upper extremity and left lower extremity, improved attention, improved awareness and improved coordination.  Patient to discharge at a wheelchair level Max Assist.   Patient's care partner is independent to provide the necessary physical and cognitive assistance at discharge.  Reasons goals not met: All goals met  Recommendation:  Patient will benefit from ongoing skilled PT services in outpatient setting to continue to advance safe functional mobility, address ongoing impairments in coordination, balance, ROM, activity tolerance, functional mobility, and minimize fall risk.  Equipment: No equipment provided  Reasons for discharge: treatment goals met and discharge from hospital  Patient/family agrees with progress made and goals achieved: Yes  PT Discharge Precautions/Restrictions Precautions Precautions: Fall Precaution Comments: Trach, PEG, PMSV Required Braces or Orthoses: Other Brace/Splint Other Brace/Splint: Pt has B AFOs and UE splints Restrictions Weight Bearing Restrictions: No Vision/Perception  Vision - Assessment Additional Comments: over/undershooting when reaching, improved with glasses Praxis Praxis-Other Comments: suspected WFL, appear limited d/t physical limitations/deficits  Cognition Overall Cognitive Status: History of cognitive impairments - at baseline Arousal/Alertness: Awake/alert Orientation Level: Oriented to person;Oriented to situation Attention: Focused;Sustained Focused Attention: Appears intact Sustained Attention:  Impaired Sustained Attention Impairment: Verbal basic;Functional basic Memory: Impaired Awareness: Impaired Awareness Impairment: Emergent impairment;Intellectual impairment Problem Solving: Impaired Problem Solving Impairment: Verbal basic;Functional basic Safety/Judgment: Impaired Comments: Pt with hx of enpehalopathy; non-verbal; answers yes/no questions with head nod, thumbs up/down ~25% of time Sensation Sensation Light Touch: Appears Intact Proprioception: Impaired Detail Proprioception Impaired Details: Impaired RLE;Impaired LLE Coordination Gross Motor Movements are Fluid and Coordinated: No Fine Motor Movements are Fluid and Coordinated: No Heel Shin Test: Impaired d/t ataxia, hypertonicity, rigidity Motor  Motor Motor: Abnormal postural alignment and control;Abnormal tone;Primitive reflexes present;Ataxia (tetraparesis)  Mobility Bed Mobility Bed Mobility: Rolling Right;Rolling Left Rolling Right: 2: Max assist;With rail Rolling Right Details: Tactile cues for placement;Tactile cues for weight shifting;Verbal cues for sequencing;Verbal cues for technique;Tactile cues for weight bearing Rolling Left: 2: Max assist;With rail Rolling Left Details: Verbal cues for technique;Verbal cues for sequencing;Tactile cues for weight beaing;Tactile cues for weight shifting;Tactile cues for placement Supine to Sit: 2: Max assist;HOB flat Supine to Sit: Patient Percentage: 10% Supine to Sit Details: Tactile cues for placement;Tactile cues for weight beaing;Tactile cues for weight shifting;Verbal cues for technique;Verbal cues for sequencing;Manual facilitation for placement;Manual facilitation for weight shifting;Manual facilitation for weight bearing Sit to Supine: 2: Max assist;HOB flat Sit to Supine: Patient Percentage: 20% (improving assist for LE management onto bed) Sit to Supine - Details: Manual facilitation for weight bearing;Manual facilitation for weight shifting;Manual  facilitation for placement;Verbal cues for sequencing;Verbal cues for technique;Tactile cues for weight shifting;Tactile cues for posture Transfers Transfers: Yes Sit to Stand: 3: Mod assist;With upper extremity assist (in stedy) Sit to Stand Details: Manual facilitation for weight shifting;Manual facilitation for placement;Tactile cues for weight shifting;Tactile cues for posture Sit to Stand Details (indicate cue type and reason): assist for placement of UEs, cues for anterior weight shift Stand to Sit: 3: Mod assist;With upper extremity assist Stand to Sit Details (indicate cue type and reason): Verbal cues for technique;Tactile cues for weight shifting;Tactile cues for  posture Stand to Sit Details: cues for eccentric control Stand Pivot Transfers: 2: Max assist Stand Pivot Transfer Details: Tactile cues for weight shifting;Tactile cues for posture;Manual facilitation for weight shifting Stand Pivot Transfer Details (indicate cue type and reason): maxA for upright posture, assist with prevent fall with BLE spasms; pt progressed feet without assist however poor placement d/t ataxia requiring assist to correct  Squat Pivot Transfers: 2: Max Teacher, English as a foreign language Transfer Details: Verbal cues for sequencing;Tactile cues for placement;Tactile cues for weight shifting;Manual facilitation for placement;Manual facilitation for weight shifting;Verbal cues for technique Lateral/Scoot Transfers: 2: Max assist;With slide board;With armrests removed Lateral/Scoot Transfer Details: Verbal cues for technique;Manual facilitation for placement;Manual facilitation for weight shifting;Tactile cues for weight shifting;Tactile cues for posture Lateral/Scoot Transfer Details (indicate cue type and reason): LEs elevated on step, assist for anterior weight shift to allow pt to push through LEs for hip clearance Locomotion  Ambulation Ambulation: No Gait Gait: No Stairs / Additional Locomotion Stairs: No Wheelchair  Mobility Wheelchair Mobility: Yes Wheelchair Assistance: 1: +1 Total assist (dependent; pt does not propel) Wheelchair Parts Management: Other (comment) (dependent)  Trunk/Postural Assessment  Cervical Assessment Cervical Assessment: Exceptions to [facility] (Limited cervical ROM, L head tilt) Thoracic Assessment Thoracic Assessment: Exceptions to [facility] (L lateral scoliosis, rounded shoulders, B scapular elevation) Lumbar Assessment Lumbar Assessment: Exceptions to [facility] (posterior pelvic tilt, L hip hike, poor lumbopelvic dissociation) Postural Control Postural Control: Deficits on evaluation Head Control: Lateral lean to L requiring head support in chair, corrected with cueing R ear to shoulder however not maintained functionally Trunk Control: Limited anterior leaning abilities; L lean  Balance Balance Balance Assessed: Yes Static Sitting Balance Static Sitting - Balance Support: Feet supported Static Sitting - Level of Assistance: 5: Stand by assistance;4: Min assist Dynamic Sitting Balance Dynamic Sitting - Balance Support: Feet supported Dynamic Sitting - Level of Assistance: 4: Min assist;3: Mod assist Dynamic Sitting Balance - Compensations: L posterolateral lean Sitting balance - Comments: Sitting EOM Static Standing Balance Static Standing - Balance Support: Bilateral upper extremity supported Static Standing - Level of Assistance: 2: Max assist;5: Stand by assistance Static Standing - Comment/# of Minutes: MaxA BUE support on therapist shoulders; min guard for static standing BUE support on platform RW, cues for BLE extension, anterior weight shift Dynamic Standing Balance Dynamic Standing - Balance Support: Bilateral upper extremity supported Dynamic Standing - Level of Assistance: 1: +1 Total assist Dynamic Standing - Comments: stand pivot transfer  Extremity Assessment  RUE Assessment RUE Assessment: Exceptions to [facility] RUE PROM (degrees) Overall PROM Right Upper Extremity:  Deficits;Due to premorbid status RUE Strength RUE Overall Strength: Deficits;Due to premorbid status RUE Overall Strength Comments: Impaired due to hx of encephalopathy; limited shoulder flexion and elbow extension LUE Assessment LUE Assessment: Exceptions to [facility] LUE Strength LUE Overall Strength: Deficits;Due to premorbid status LUE Overall Strength Comments: Roughly 2+/5 throughout,  Left Hand Gross Grasp: Impaired (Limited control/ awareness of grasping position, however, strong grasp strength when positioned correctly for functional transfers (i.e. on STEADY)) LUE Tone LUE Tone: Hypertonic LUE Tone Comments: Pt with synergistic patterns, esp at night. Splint provided by [employer] for daytime/ night time use for stretching/ ROM, pt tolerates well RLE Assessment RLE Assessment: Exceptions to [facility] RLE PROM (degrees) Overall PROM Right Lower Extremity: Deficits RLE Overall PROM Comments: due to rigidity; able to demo full BLE knee/hip extension in standing frame RLE Strength RLE Overall Strength: Deficits RLE Overall Strength Comments: strength available likely due to/assisted by hypertonicity RLE Tone  RLE Tone: Hypertonic;Moderate Hypertonic Details: fluctuating tone with associated reactions when opposite extremity performing AROM/PROM LLE Assessment LLE Assessment: Exceptions to WFL LLE PROM (degrees) Overall PROM Left Lower Extremity: Deficits;Other (Comment) LLE Overall PROM Comments: deficits due to hypertonicity, rigidity; able to perform BLE standing full LE ROM in standing frame LLE Strength LLE Overall Strength: Deficits LLE Overall Strength Comments: available strength likely due to/assisted by hypertonicity; grossly 4/5 in standing LLE Tone LLE Tone: Moderate;Hypertonic Hypertonic Details: fluctuating tone, associated reactions when opposite extremity/trunk AROM/PROM   See Function Navigator for Current Functional Status.  Benjiman Core Tygielski 02/27/2016, 12:22  PM

## 2016-02-24 NOTE — Progress Notes (Signed)
Physical Therapy Session Note  Patient Details  Name: SEREN WINEMILLER MRN: [id number] Date of Birth: [birth year]  Today's Date: 02/24/2016 PT Individual Time: 0900-1000 PT Individual Time Calculation (min): 60 min   Short Term Goals: Week 3:  PT Short Term Goal 1 (Week 3): =LTG due to estimated length of stay  Skilled Therapeutic Interventions/Progress Updates: Pt received seated in w/c, denies pain and agreeable to treatment. Caregiver [name] performed transfer w/c <>bed with transfer board maxA, and sit <>supine modA with improving LE activation/coordination over EOB. Squat pivot transfer w/c >nustep with maxA. Performed nustep x10 min BUE/BLE on level 5 for coordination, strengthening and endurance. Sit >stand from nustep maxA x2 trials. Performed pivotal steps mod/maxA due to spasms and occasional buckling. Pt progresses LEs without assist, however ataxic and incoordinated foot placement requiring assist to correct. Remained seated in w/c at end of session, all needs in reach.      Therapy Documentation Precautions:  Precautions Precautions: Fall Precaution Comments: Trach, PEG, supplemental O2, PMSV Required Braces or Orthoses: Other Brace/Splint Other Brace/Splint: Pt has B AFOs and UE splints Restrictions Weight Bearing Restrictions: No   See Function Navigator for Current Functional Status.   Therapy/Group: Individual Therapy  Luberta Mutter 02/24/2016, 12:51 PM

## 2016-02-25 ENCOUNTER — Inpatient Hospital Stay (HOSPITAL_COMMUNITY): Payer: BC Managed Care – PPO | Admitting: Physical Therapy

## 2016-02-25 ENCOUNTER — Ambulatory Visit: Payer: BC Managed Care – PPO | Admitting: Pulmonary Disease

## 2016-02-25 MED ORDER — NAPHAZOLINE-GLYCERIN 0.012-0.2 % OP SOLN: 1.0000 [drp] | Freq: Three times a day (TID) | OPHTHALMIC | 0 refills | Status: DC

## 2016-02-25 MED ORDER — GUAIFENESIN 100 MG/5ML PO SOLN: 10.0000 mL | Freq: Four times a day (QID) | ORAL | 0 refills | Status: DC

## 2016-02-25 MED ORDER — FREE WATER: 200.0000 mL | Freq: Four times a day (QID) | Status: DC

## 2016-02-25 MED ORDER — LORATADINE 10 MG PO TABS: 10.0000 mg | ORAL_TABLET | Freq: Every day | ORAL | Status: DC | PRN

## 2016-02-25 MED ORDER — DIAZEPAM 2 MG PO TABS: 2.0000 mg | ORAL_TABLET | Freq: Every day | ORAL | 0 refills | Status: DC

## 2016-02-25 MED ORDER — GERHARDT'S BUTT CREAM: 1.0000 "application " | TOPICAL_CREAM | Freq: Four times a day (QID) | CUTANEOUS | 0 refills | Status: DC

## 2016-02-25 MED ORDER — SACCHAROMYCES BOULARDII 250 MG PO CAPS: ORAL_CAPSULE | ORAL | 0 refills | Status: DC

## 2016-02-25 MED ORDER — PROCHLORPERAZINE MALEATE 5 MG PO TABS: 5.0000 mg | ORAL_TABLET | Freq: Four times a day (QID) | ORAL | 0 refills | Status: DC | PRN

## 2016-02-25 MED ORDER — POTASSIUM CHLORIDE 20 MEQ/15ML (10%) PO SOLN: 20.0000 meq | Freq: Every day | ORAL | 0 refills | Status: DC

## 2016-02-25 MED ORDER — SIMETHICONE 40 MG/0.6ML PO SUSP: 40.0000 mg | Freq: Four times a day (QID) | ORAL | 0 refills | Status: DC | PRN

## 2016-02-25 MED ORDER — AMLODIPINE BESYLATE 5 MG PO TABS: 5.0000 mg | ORAL_TABLET | Freq: Every day | ORAL | 0 refills | Status: DC

## 2016-02-25 MED ORDER — NYSTATIN 100000 UNIT/GM EX POWD: 1.0000 g | Freq: Two times a day (BID) | CUTANEOUS | 0 refills | Status: DC | PRN

## 2016-02-25 MED ORDER — FREE WATER: 100.0000 mL | Freq: Every day | Status: DC

## 2016-02-25 MED ORDER — AMANTADINE HCL 50 MG/5ML PO SYRP: 150.0000 mg | ORAL_SOLUTION | Freq: Two times a day (BID) | ORAL | 0 refills | Status: DC

## 2016-02-25 MED ORDER — OSMOLITE 1.5 CAL PO LIQD
1000.0000 mL | ORAL | Status: DC
  Filled 2016-02-25: qty 1000

## 2016-02-25 NOTE — Patient Care Conference (Signed)
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 Acute on chronic respiratory failure with hypoxia ([place])   . Acute hypoxemic respiratory failure ([place])   . Acute blood loss anemia   . Atelectasis 02/04/2016  . Debility 02/03/2016  . Pneumonia of both lower lobes due to Pseudomonas species ([place])   . Pneumonia of both lower lobes due to methicillin susceptible Staphylococcus aureus (MSSA) ([place])   . Acute respiratory failure with hypoxia ([place])   . Acute encephalopathy   . Seizures ([place])   . Sepsis ([place])   . Hypoxia   . Pain   . HCAP (healthcare-associated pneumonia) 12/28/2015  . Chronic respiratory failure ([place]) 12/28/2015  . Acute tracheobronchitis 12/24/2015  . Tracheostomy tube present ([place])   . Tracheal stenosis   . Chronic respiratory failure with hypoxia ([place])   . 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 Tracheostomy status ([place]) 09/26/2015  . Allergic rhinitis 09/26/2015  . 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 Movement disorder 09/22/2015  . Encephalitis 09/22/2015  . Chest pain 02/07/2014  . Premature atrial contractions 01/13/2014  . Abnormality of gait 12/04/2013  . Hyperlipidemia 12/21/2011  . Cardiovascular risk factor 12/21/2011  . Bunion 01/31/2008  . Metatarsalgia of both feet 09/20/2007  . FLAT FOOT 09/20/2007  . HALLUX RIGIDUS, ACQUIRED 09/20/2007    Expected Discharge Date: Expected Discharge Date:  02/25/16  Team Members Present: Physician leading conference: Dr. Alger Simons Social Worker Present: Lennart Pall, LCSW Nurse Present: Heather Roberts, RN PT Present: Canary Brim, Harriet Pho, PT OT Present: Napoleon Form, OT SLP Present: Weston Anna, SLP PPS Coordinator present : Daiva Nakayama, RN, CRRN     Current Status/Progress Goal Weekly Team Focus  Medical   improving arousal and engagement. downsized to #4 trach yesterday. wants to drink!!  botox effective  continue to increase ROM/weaning trach  tone control/ROM. weaning meds. trach mgt   Bowel/Bladder   Continent at times of B/B. Up to [facility] with stedy. LBM 02/20/16. Contact enteric precautions for C. Diff.  Maintain B/B continence with max assistance.  Assess I/O q shift. Offer toileting q2h and prn. Encourage use of BSC or toilet as often as possible.   Swallow/Nutrition/ Hydration   NPO with PEG  participation in dysphagia therapy with Max A  Education    ADL's   Max- Total A +1 overall  max- total A +1 overall  Tx goals met, pt with planned d/c home today   Mobility   maxA bed mobility and transfers slide board and stedy, maxA sit <>stand with platform RW  MaxA bed mobility, maxA transfers w/c <>bed, S dynamic sitting balance  LE ROM, sit <>stand in stedy/standing frame/platform RW, dynamic sitting balance, pt/family education and hands-on training   Communication   #4 cuffless trach, PMSV during all waking hours. Max A for communication   Max A  Education    Sport and exercise psychologist Observations  Pain   Denies any pain or discomfort. Scheduled baclofen for muscle spasms. PRN tylenol.  Maintain comfortable pain level on a scale of 0-10 with a goal of less than 3.  Assess pain Q shift and PRN. Treat with medications or alternative therapies as appropriate.   Skin   MASD to buttocks with Gerherdts cream and nystatin powder.  Preven new skin breakdown.  Assess skin Q shift and PRN. Apply cream and powder  Q change PRN.    Rehab Goals Patient on target to meet rehab goals: Yes *See Care Plan and progress notes for long and short-term goals.  Barriers to Discharge: baseline deficits, myoclonus    Possible Resolutions to Barriers:  family ed, ROM strategies, further improvement after botox    Discharge Planning/Teaching Needs:  Plan home with husband and 24/7 caregivers  ongoing   Team Discussion:  Ready for dc tomorrow.  Increased verbalizations.  Tolerating #4 track and did well with ice chips.  Decreased tone;  Elbow splint helpful.  Consistently at +1 assist.  Revisions to Treatment Plan:  None   Continued Need for Acute Rehabilitation Level of Care: The patient requires daily medical management by a physician with specialized training in physical medicine and rehabilitation for the following conditions: Daily direction of a multidisciplinary physical rehabilitation program to ensure safe treatment while eliciting the highest outcome that is of practical value to the patient.: Yes Daily medical management of patient stability for increased activity during participation in an intensive rehabilitation regime.: Yes Daily analysis of laboratory values and/or radiology reports with any subsequent need for medication adjustment of medical intervention for : Neurological problems;Pulmonary problems;Mood/behavior problems  Cabrini Ruggieri 02/25/2016, 8:44 AM

## 2016-02-25 NOTE — Discharge Summary (Signed)
Physician Discharge Summary  Patient ID: Judy Spears MRN: [id number] DOB/AGE: 02-06-1950 66 y.o.  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   Significant Diagnostic Studies: Dg Chest Port 1 View  Result Date: 02/19/2016 CLINICAL DATA:  Cough. EXAM: PORTABLE CHEST 1 VIEW COMPARISON:  02/11/2016.  02/03/2016 . FINDINGS: Tracheostomy tube noted with its tip over the trachea. Bibasilar subsegmental atelectasis. No pleural effusion or pneumothorax. Heart size normal. No pneumothorax . IMPRESSION: Bibasilar subsegmental atelectasis and/or scarring. Chest is stable from prior exam. Electronically Signed   By: Marcello Moores  Register   On: 02/19/2016 15:22   Dg Chest Port 1 View  Result Date: 02/11/2016 CLINICAL DATA:  Atelectasis. EXAM: PORTABLE CHEST 1 VIEW COMPARISON:  02/06/2016. FINDINGS: Tracheostomy tube noted in stable position. Persistent but slightly improved bibasilar subsegmental atelectasis. Tiny left pleural effusion again noted. No pneumothorax. Heart size stable. No acute bony abnormality . IMPRESSION: 1. Tracheostomy tube in stable position. 2. Persistent but improving bibasilar subsegmental atelectasis. Tiny left pleural effusion again noted. Electronically Signed   By: Marcello Moores  Register   On: 02/11/2016 08:46   Dg Chest Port 1 View  Result Date: 02/06/2016 CLINICAL DATA:  Hypoxia. EXAM: PORTABLE CHEST 1 VIEW COMPARISON:  02/04/2016 .  01/03/2016.  01/01/2016. FINDINGS: Tracheostomy tube noted stable position. Heart size stable. Persistent dense subsegmental atelectasis in the right base. Associated pneumonia cannot be excluded.  Mild left base subsegmental atelectasis. Small left pleural effusion .  No pneumothorax . IMPRESSION: 1. Tracheostomy tube in stable position. 2. Persistent prominent right base subsegmental atelectasis. Infiltrate cannot be excluded. 3. Mild left base subsegmental atelectasis and tiny left pleural effusion . Electronically Signed   By: Marcello Moores  Register   On: 02/06/2016 07:40   Dg Chest Port 1 View  Result Date: 02/04/2016 CLINICAL DATA:  History of pneumonia, followup EXAM: PORTABLE CHEST 1 VIEW COMPARISON:  Portable chest x-ray 02/03/2016 FINDINGS: There has been an increase in opacity at the right lung base consistent with atelectasis or pneumonia. Mild atelectasis remains at the left lung base. No definite effusion is seen. The heart is within upper limits normal. Tracheostomy remains. IMPRESSION: Increase in opacity at the right lung base consistent with atelectasis or pneumonia. Electronically Signed   By: Ivar Drape M.D.   On: 02/04/2016 08:12   Dg Chest Port 1 View  Result Date: 02/03/2016 CLINICAL DATA:  Acute onset of shortness of breath. Initial encounter. EXAM: PORTABLE CHEST 1 VIEW COMPARISON:  Chest radiograph from 01/03/2016 FINDINGS: There is mildly worsened bibasilar airspace opacity, which could reflect recurrent or persistent pneumonia. Mild vascular congestion and peribronchial thickening are seen. No pleural effusion or pneumothorax is identified. The cardiomediastinal silhouette is normal in size. The patient's tracheostomy tube is seen ending 3-4 cm above the carina. No acute osseous abnormalities are seen. IMPRESSION: Mildly worsened bibasilar airspace opacity, which could reflect recurrent or persistent pneumonia, given clinical concern. Mild vascular congestion and peribronchial thickening seen. Electronically Signed   By: Garald Balding M.D.   On: 02/03/2016 19:19   Dg Abd Portable 1v  Result Date: 02/18/2016 CLINICAL DATA:  Abdominal distension EXAM: PORTABLE ABDOMEN - 1 VIEW  COMPARISON:  None. FINDINGS: Scattered large and small bowel gas is noted. No obstructive changes are seen. A gastrostomy catheter is noted in the upper abdomen. Mild degenerative changes of lumbar spine are seen. IMPRESSION: No acute abnormality noted. Electronically Signed   By: Inez Catalina M.D.   On: 02/18/2016 15:40    Labs:  Basic Metabolic Panel: BMP Latest Ref Rng & Units 02/24/2016 02/16/2016 02/09/2016  Glucose 65 - 99 mg/dL 132(H) 131(H) 125(H)  BUN 6 - 20 mg/dL '16 10 12  '$ Creatinine 0.44 - 1.00 mg/dL 0.36(L) 0.32(L) 0.38(L)  Sodium 135 - 145 mmol/L 135 133(L) 136  Potassium 3.5 - 5.1 mmol/L 3.6 3.6 3.2(L)  Chloride 101 - 111 mmol/L 99(L) 94(L) 94(L)  CO2 22 - 32 mmol/L '26 29 30  '$ Calcium 8.9 - 10.3 mg/dL 9.4 9.8 9.6     CBC: CBC Latest Ref Rng & Units 02/20/2016 02/16/2016 02/10/2016  WBC 4.0 - 10.5 K/uL 5.6 5.0 4.7  Hemoglobin 12.0 - 15.0 g/dL 11.8(L) 12.5 12.0  Hematocrit 36.0 - 46.0 % 36.1 38.1 37.6  Platelets 150 - 400 K/uL 304 322 288    CBG:  Recent Labs Lab 02/24/16 2236  GLUCAP 24    Brief HPI:    Judy Spears is a 66 year old female with 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 Hospital course significant for vent dependence with need for recanalization of trach and decline in MS due to refractory seizures despite multiple AEDs. She was transferred to [facility] on 12/10 for management and monitoring. LTM without classic seizure activity and no clear seizures noted. Neurology felt that patient with SIRPDs (Stimulus-induced Rhythmic, Periodic or Ictal discharges)and she was weaned off AEDs. She continued to have fevers despite completion of PNA treatment and was found to be C diff positive on 12/27 and have candida UTI. She has been treated with diflucan as well as po Vanc/Flagyl. She was weaned to [facility] on  01/1 and therapy initiated. She was noted to be debilitated and CIR was recommended for follow up therapy.  Records reviewed by MD/Rehab coordinator and she was felt to be a food CIR candidate.    Hospital Course: Judy Spears was admitted to rehab 02/03/2016 for inpatient therapies to consist of PT, ST and OT at least three hours five days a week. Past admission physiatrist, therapy team and rehab RN have worked together to provide customized collaborative inpatient rehab. She was noted to copious thick secretions which were difficult to clear and pulmonary was consulted for assistance. Chest PT, saline nebulizer and robitussin was added to help  mobilize secretions. Sputum culture was positive for pseudomonas which was felt to be due to colonization as she was afebrile and no fevers noted. Secretions from trach have resolved and she is tolerating PMSV without difficulty. Respiratory status has improved and she was downsized to CFS #4 by 02/23/16.  She has has intermittent issues with N/V and water flushes were decreased to 100 cc 5 X day. She was noted to be hypokalemic at admission and K dur was dded for supplement. Follow up BMET shows renal status and lytes to be stable. Episode of N/V appear to coincide with positional changes and occasionally after medication administration. KUB 1/24 was negative for ileus or obstructive changes.  Colace was resumed to help manage constipation.   Dietician was consulted for assistance and  Tube feeds changed to VItal without significant change in symptoms.  Patient was changed back to Osmolite 1.5 with recommendations to start at 60 ml from 6 pm to 7 am with plans to advance by 10 cc every 4 hours to goal rate of 115 cc.  Serial CXR has been monitored and has been stable.  Spasticity LUE/LLE has improved with ROM and dynamic splinting of elbow and knee.  Due to severe spasticity affecting mobility and ADLs, 400 units total botox were injected into Left biceps,  brachioradialis, semimembranosus/tendinosis and biceps femoris by Dr. [name] under needle EMG guidance on 02/06/16.  She has had good results post injection and valium has been weaned down to 2 mg at bedtime.  She has had improvement in swallow function and trials of ice chips ongoing with ST.  PEG and trach site are clean and ry. MAST/rash of sacrum has almost resolved with use of Gerhardt's cream.  She has had improvement in activity tolerance and has progressed to max to total assist overall. She will continue to receive follow up outpatient PT, OT and ST at [facility] after discharge.    Rehab course: During patient's stay in rehab weekly team conferences were held to monitor patient's progress, set goals and discuss barriers to discharge. At admission patient required total assist with mobility and basic self care tasks. She demonstrated severe dysarthria with dysphonia, poor breath support, and was able to follow simple one step commands with extra time. She has had improvement in activity tolerance, balance, postural control, as well as ability to compensate for deficits.  She requires mod assist for bed mobility, is able to perform sliding board transfers with max assist and perform sit to stand from Nustep with max assist  She is able to perform pivoting steps with mod to max assist due to spasms and occasion buckling. She is consuming ice chips with timely mastication and is able to swallow on command. She requires max multimodal cues to utilize strategies for speech clarity to express wants/needs. Family education was completed with husband and caregiver who will assist as needed after discharge.  .      Disposition: Home   Diet:  Nothing by mouth  Special Instructions: 1. To resume Osmolite at 60 cc/hr. Increase by 10 cc every 4 hours as tolerated to goa of 115 cc/hr from 6 pm to 7 am. Monitor for tolerance and side effects.  2. Water flushes 100cc five times a day to maintain  hydration.   3. Recheck BMET in 7-10 days to recheck renal status and K+ levels.  4. Miralax every 2-3 days per PEG if constipated.    Allergies as of 02/25/2016      Reactions   Pollen Extract Other (See Comments)   Running nose   Tetracyclines & Related    Blisters, photosensitivity      Medication List    STOP taking these medications   clonazePAM 0.5 MG tablet Commonly known as:  KLONOPIN   CO Q 10 PO   feeding supplement (VITAL AF 1.2 CAL) Liqd   fentaNYL 100 MCG/2ML injection Commonly known as:  SUBLIMAZE   heparin 5000 UNIT/ML injection   insulin aspart 100 UNIT/ML injection Commonly known as:  novoLOG   lacosamide 200 mg in sodium chloride 0.9 % 25 mL   LACTINEX Pack   levETIRAcetam 1,500 mg in sodium chloride 0.9 % 100 mL   LORazepam 2 MG/ML injection Commonly known as:  ATIVAN   oxcarbazepine 600 MG  tablet Commonly known as:  TRILEPTAL   pantoprazole sodium 40 mg/20 mL Pack Commonly known as:  PROTONIX   sodium chloride 0.9 % infusion   traZODone 50 MG tablet Commonly known as:  DESYREL   valproate 750 mg in dextrose 5 % 50 mL     TAKE these medications   acetaminophen 160 MG/5ML solution Commonly known as:  TYLENOL Place 20.3 mLs (650 mg total) into feeding tube every 6 (six) hours as needed for mild pain, headache or fever.   amantadine 50 MG/5ML solution Commonly known as:  SYMMETREL Place 15 mLs (150 mg total) into feeding tube 2 (two) times daily. In am and at noon   amLODipine 5 MG tablet Commonly known as:  NORVASC Place 1 tablet (5 mg total) into feeding tube daily. What changed:  how to take this   atorvastatin 20 MG tablet Commonly known as:  LIPITOR Place 20 mg into feeding tube daily.   baclofen 10 MG tablet Commonly known as:  LIORESAL Place 10 mg into feeding tube every 6 (six) hours.   chlorhexidine gluconate (MEDLINE KIT) 0.12 % solution Commonly known as:  PERIDEX 15 mLs by Mouth Rinse route 2 (two) times daily.    diazepam 2 MG tablet--Rx # 30 pills Commonly known as:  VALIUM Place 1 tablet (2 mg total) into feeding tube at bedtime. What changed:  when to take this  Another medication with the same name was removed. Continue taking this medication, and follow the directions you see here.   docusate 60 MG/15ML syrup Commonly known as:  COLACE Place 60 mg into feeding tube daily.   fluticasone 50 MCG/ACT nasal spray Commonly known as:  FLONASE Place 1 spray into both nostrils 2 (two) times daily.   free water Soln Place 100 mLs into feeding tube 5 (five) times daily. What changed:  how much to take  when to take this   Gerhardt's butt cream Crea Apply 1 application topically 4 (four) times daily.   guaiFENesin 100 MG/5ML Soln Commonly known as:  ROBITUSSIN Place 10 mLs (200 mg total) into feeding tube 4 (four) times daily.   loratadine 10 MG tablet Commonly known as:  CLARITIN Place 1 tablet (10 mg total) into feeding tube daily as needed for allergies. What changed:  how to take this  when to take this  reasons to take this   Melatonin 3 MG Tabs Place 6 mg into feeding tube every evening.   mouth rinse Liqd solution 15 mLs by Mouth Rinse route QID.   naphazoline-glycerin 0.012-0.2 % Soln Commonly known as:  CLEAR EYES Place 1-2 drops into both eyes 4 (four) times daily -  with meals and at bedtime.   nystatin powder Commonly known as:  MYCOSTATIN/NYSTOP Apply 1 g topically 2 (two) times daily as needed.   potassium chloride 20 MEQ/15ML (10%) Soln Place 15 mLs (20 mEq total) into feeding tube daily. Start taking on:  02/26/2016   prochlorperazine 5 MG tablet Commonly known as:  COMPAZINE Place 1-2 tablets (5-10 mg total) into feeding tube every 6 (six) hours as needed for nausea.   saccharomyces boulardii 250 MG capsule Commonly known as:  FLORASTOR Open capsule and place contents into feeding tube twice a day   simethicone 40 MG/0.6ML drops Commonly known  as:  MYLICON Place 0.6 mLs (40 mg total) into feeding tube 4 (four) times daily as needed for flatulence.   valACYclovir 500 MG tablet Commonly known as:  VALTREX Place 500 mg into  feeding tube daily.      Follow-up Information    Meredith Staggers, MD Follow up.   Specialty:  Physical Medicine and Rehabilitation Why:  office will call you with follow up appointment Contact information: [address] [place] [address] [phone]        Simonne Maffucci, MD. Call.   Specialty:  Pulmonary Disease Why:  for routine check and as needed depending on respiratory issues.  Contact information: [place] [address] [phone]        MCNEILL,WENDY, MD Follow up on 03/10/2016.   Specialty:  Family Medicine Why:  @ 1:15 pm (hospital follow up appointment) Contact information: [place] [address] [phone]        [facility] Follow up on 05/05/2016.   Specialty:  Respiratory Therapy Why:  2pm w/ Marni Griffon ->don't forget to bring her trach with you  Contact information: [address] [id number] [place]          Signed: Bary Leriche 02/25/2016, 11:22 AM

## 2016-02-25 NOTE — Progress Notes (Signed)
Nutrition Follow-up  DOCUMENTATION CODES:   Not applicable  INTERVENTION:   Home tube feeding regimen: Nocturnal tube feeds (6pm-7am) of Osmolite 1.5 formula via PEG. First night, start at 60 ml and increase by 10 ml every 4 hours. Second night, start at last TF rate left at and continue to increase by 10 ml every 4 hours to goal rate of 115 ml/hr.   Tube feeding regimen will provide 2243 kcal (100% of needs), 94 grams of protein, 1136 ml of water.  Provide free water flushes of 200 ml given 4 times during the day. Total free water: 1936 ml/day.  NUTRITION DIAGNOSIS:   Increased nutrient needs related to chronic illness as evidenced by estimated needs; ongoing  GOAL:   Patient will meet greater than or equal to 90% of their needs; met  MONITOR:   TF tolerance, Skin, I & O's, Labs, Weight trends  REASON FOR ASSESSMENT:   Consult Enteral/tube feeding initiation and management  ASSESSMENT:   66 year old female with history of encephalitis due to [place] virus 27/6184 complicated by VDRF, R-VC paralysis, tracheal stenosis s/p repair, spasticity, restlessness,  recent admission 12/28/2015 for acute respiratory failure due to Pseudomonas and MSSA multilobar HCAP. Hospital course significant for vent dependence with need for recanalization of trach and decline in MS due to refractory seizures despite multiple AEDs. She was transferred to [facility] on 12/10 for management and monitoring. 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 She was weaned to [facility] on 01/1 and tolerating PMSV.  Plans for discharge home today. Husband and caregiver request wanting to continue Osmolite 1.5 formula at home as they have a large supply of it still at home. Noted pt with n/v last night and thus tube feeding was stopped. Spoke with  PA, cause of n/v may be related to repositioning of patient. Additionally discussed home TF plans with PA and orders/recommendations to be placed to start tube feeds at a low and slow rate to aid in tolerance. Discussed with husband and caregiver regarding new home TF orders. They expressed understanding and no further questions were asked.   Diet Order:  Diet NPO time specified  Skin:  Reviewed, no issues  Last BM:  1/30  Height:   Ht Readings from Last 1 Encounters:  02/03/16 5' 4" (1.626 m)    Weight:   Wt Readings from Last 1 Encounters:  02/25/16 117 lb 4.6 oz (53.2 kg)    Ideal Body Weight:  54.5 kg  BMI:  Body mass index is 20.13 kg/m.  Estimated Nutritional Needs:   Kcal:  2000-2200  Protein:  90-110 grams  Fluid:  2-2.1 L/day  EDUCATION NEEDS:   No education needs identified at this time  Corrin Parker, MS, RD, LDN Pager # [phone] After hours/ weekend pager # [phone]

## 2016-02-25 NOTE — Progress Notes (Signed)
Nurse checked patient's PEG tube for residual. 78mL of water residual aspirated. Residual drained to gravity via tube.

## 2016-02-25 NOTE — Discharge Instructions (Signed)
Inpatient Rehab Discharge Instructions  ARGIRO BOUNDY Discharge date and time:  02/25/16  Activities/Precautions/ Functional Status: Activity:  As tolerated with assistance.   Diet:  Nothing by mouth.  Wound Care: Trach and PEG care  twice a day.   Functional status:  ___ No restrictions     ___ Walk up steps independently _X__ 24/7 supervision/assistance   ___ Walk up steps with assistance ___ Intermittent supervision/assistance  ___ Bathe/dress independently ___ Walk with walker     _X__ Bathe/dress with assistance ___ Walk Independently    ___ Shower independently ___ Walk with assistance    ___ Shower with assistance _X__ No alcohol     ___ Return to work/school ________    COMMUNITY REFERRALS UPON DISCHARGE:    Outpatient: PT     OT    ST                  Agency:  [facility]     Phone:  [phone]              Appointment Date/Time:  2/5   9:30 -  PT                                                                        10:15 - OT                                                                        11:00 - ST  Special Instructions: 1. Resume Osmolite at 60 cc/hr. Increase by 10 cc every 4 hours as tolerated to goa of 125 cc/hr for 16 hours daily.  Monitor for tolerance and side effects.  2. Water flushes 100cc five times a day to maintain hydration.   3. Cleanse around PEG site with soap and water. Pat dry.  4. Miralax every 2-3 days per PEG if constipated.   My questions have been answered and I understand these instructions. I will adhere to these goals and the provided educational materials after my discharge from the hospital.  Patient/Caregiver Signature _______________________________ Date __________  Clinician Signature _______________________________________ Date __________  Please bring this form and your medication list with you to all your follow-up doctor's appointments.

## 2016-02-25 NOTE — Progress Notes (Signed)
[facility]ILIT[facility]     PROGRESS NOTE    Subjective/Complaints: A little fatigued this morning. Vomited TF last night. TF held for time being  ROS: Limited due cognitive/behavioral        Objective: Vital Signs: Blood pressure 126/71, pulse 79, temperature 98 F (36.7 C), temperature source Oral, resp. rate 17, height 5\' 4"  (1.626 m), weight 53.2 kg (117 lb 4.6 oz), last menstrual period 12/01/2010, SpO2 100 %. No results found. No results for input(s): WBC, HGB, HCT, PLT in the last 72 hours.  Recent Labs  02/24/16 0707  NA 135  K 3.6  CL 99*  GLUCOSE 132*  BUN 16  CREATININE 0.36*  CALCIUM 9.4   CBG (last 3)   Recent Labs  02/24/16 2236  GLUCAP 88    Wt Readings from Last 3 Encounters:  02/25/16 53.2 kg (117 lb 4.6 oz)  02/03/16 52.2 kg (115 lb)  01/04/16 56.6 kg (124 lb 12.5 oz)    Physical Exam:  Constitutional: more fatigued appearing today HENT: Normocephalicand atraumatic.  Eyes: No discharge, EOM appear intact Neck: #4 trach with PMV.   Cardiovascular: RRR Respiratory: CTA GI: Soft, NT, BS+  PEG site remains clean and dry. Musculoskeletal: She exhibits no edema, no tenderness.  Neurological: She is alert and making good eye contact MAS:  LUE and LLE. LUE 1-2/4. LLE now 1-2/4.     -elbow with PROM near 130 today  -pt  extends  left knee to almost 180 degrees       Motor: 3- to 3/5 RUE and RLE  2-3/5 LUE and LLE Skin: Skin is warmand dry.  rash on buttocks/sacrum nearly resolved Psychiatric: alert and engages  Assessment/Plan: 1. Functional, cognitive, and mobility deficits secondary to encephalopathy/debility which require 3+ hours per day of interdisciplinary therapy in a comprehensive inpatient rehab setting. Physiatrist is providing close team supervision and 24 hour management of active medical problems listed below. Physiatrist and rehab team continue to assess barriers to discharge/monitor patient  progress toward functional and medical goals.  Function:  Bathing Bathing position   Position: Shower  Bathing parts   Body parts bathed by helper: Right arm, Left arm, Chest, Abdomen, Front perineal area, Buttocks, Right upper leg, Left upper leg, Right lower leg, Left lower leg, Back  Bathing assist Assist Level: 2 helpers      Upper Body Dressing/Undressing Upper body dressing   What is the patient wearing?: Bra, Pull over shirt/dress Bra - Perfomed by patient: Thread/unthread right bra strap, Thread/unthread left bra strap Bra - Perfomed by helper: Hook/unhook bra (pull down sports bra)   Pull over shirt/dress - Perfomed by helper: Thread/unthread right sleeve, Thread/unthread left sleeve, Put head through opening, Pull shirt over trunk        Upper body assist Assist Level: 2 helpers      Lower Body Dressing/Undressing Lower body dressing   What is the patient wearing?: Pants, Socks, Shoes       Pants- Performed by helper: Thread/unthread right pants leg, Thread/unthread left pants leg, Pull pants up/down, Fasten/unfasten pants       Socks - Performed by helper: Don/doff right sock, Don/doff left sock   Shoes - Performed by helper: Don/doff right shoe, Don/doff left shoe, Fasten right, Fasten left          Lower body assist Assist for lower body dressing:  (Standing in STEADY)      Toileting Toileting Toileting activity did not occur: No continent bowel/bladder  event   Toileting steps completed by helper: Adjust clothing prior to toileting, Performs perineal hygiene, Adjust clothing after toileting Toileting Assistive Devices: Other (comment) (STEADY)  Toileting assist Assist level: Two helpers   Transfers Chair/bed transfer   Chair/bed transfer method: Lateral scoot Chair/bed transfer assist level: Maximal assist (Pt 25 - 49%/lift and lower) Chair/bed transfer assistive device: Sliding board, Armrests Mechanical lift: Stedy   Locomotion Ambulation  Ambulation activity did not occur: Safety/medical concerns         Wheelchair   Type: Manual   Assist Level: Dependent (Pt equals 0%)  Cognition Comprehension Comprehension assist level: Understands basic 50 - 74% of the time/ requires cueing 25 - 49% of the time  Expression Expression assist level: Expresses basic 25 - 49% of the time/requires cueing 50 - 75% of the time. Uses single words/gestures.  Social Interaction Social Interaction assist level: Interacts appropriately 75 - 89% of the time - Needs redirection for appropriate language or to initiate interaction.  Problem Solving Problem solving assist level: Solves basic less than 25% of the time - needs direction nearly all the time or does not effectively solve problems and may need a restraint for safety  Memory Memory assist level: Recognizes or recalls less than 25% of the time/requires cueing greater than 75% of the time   Medical Problem List and Plan: 1. Functional, mobility and cognitive/communication deficitssecondary to debility in setting of prior encephalopathy/tetraplegia  -continue CIR PT, OT,  SLP  -home today with [facility] follow up  Patient to see me in the office for transitional care encounter in 1-2 weeks. .  2. DVT Prophylaxis/Anticoagulation: Pharmaceutical: Lovenox 3. Pain Management: tylenol prn 4. Mood: LCSW to follow for evaluation when appropriate  -no changes 5. Neuropsych: This patient is notcapable of making decisions on herown behalf. 6. Skin/Wound Care: routine pressure relief measures, local care for rash 7. Fluids/Electrolytes/Nutrition:  TF formula recently changed to Vital 1.5-   -decreased water flushes also to help with tolerance  -another episode of vomiting last night  -can temporarily reduce volume/rate. May want to lengthen time of administration  -+ results with bowel program yesterday. Belly soft  -need to make sure her head is elevated during TF also  -BMET yesterday normal 8.  Spasticity: On baclofen qid and valium.  -now off day time valium. Reduced valium to 4mg  qhs--reduce further to 2.5mg .   -will hold off on resuming klonopin at this time  -consider dantrium trial   -resting left elbow splint per [facility]      -continue to push elbow beyond 130 deg +    -should wear after therapies/evening -splinting, dynamic left elbow, knee.  -ROM with therapy -botox injection performed 1/12 --with improved rom LUE and LLE  -consider resting knee brace LLE as outpt  -consider phenol block, musculocutaneous 9. Candida UTI/C diff colitis: tested + 12/27. has completed treatment on 01/31/16.   -continue contact precautions for c diff thru 03/01/16 if here 10 VDRF: now with cuffless #4 trach.  tolerating PMV   -  nebulizers prn and mucinex to loosen secretions.     -continue nebs, chest pt, OOB, suction  -[facility] followup as outpt/trach clinic 11. ABLA  Hb 11.8    Cont to monitor    LOS (Days) 22 A FACE TO FACE EVALUATION WAS PERFORMED  Meredith Staggers, MD 02/25/2016 9:03 AM

## 2016-02-25 NOTE — Progress Notes (Signed)
Social Work  Discharge Note  The overall goal for the admission was met for:   Discharge location: Yes - home with spouse and private caregivers  Length of Stay: Yes - 22 days  Discharge activity level: Yes - +1 max/ total assistance  Home/community participation: Yes  Services provided included: MD, RD, PT, OT, SLP, RN, TR, Pharmacy and SW  Financial Services: Private Insurance: [place]  Follow-up services arranged: Outpatient: PT, OT, ST and Patient/Family request agency HH: Spouse requests referral to resume OP therapies at [facility], DME: NA - had all needed DME PTA  Comments (or additional information):  Patient/Family verbalized understanding of follow-up arrangements: Yes  Individual responsible for coordination of the follow-up plan: pt/spouse  Confirmed correct DME delivered: NA - had all needed DME    Janett Kamath

## 2016-02-26 ENCOUNTER — Telehealth: Payer: Self-pay | Admitting: *Deleted

## 2016-02-26 ENCOUNTER — Telehealth: Payer: Self-pay

## 2016-02-26 NOTE — Telephone Encounter (Signed)
Appointment time 03/10/16 @ 3:40 pm W/ Dr. [name] [address]  Health and History form has been mailed Appointment time Confirmed    Transitional Care call- Rugenia Hausauer  1. Are you/is patient experiencing any problems since coming home? No Are there any questions regarding any aspect of care? Requested they do a retest on C-Diff 2. Are there any questions regarding medications administration/dosing? Yes, but a few medicines have not been refilled Are meds being taken as prescribed? Yes Patient should review meds with caller to confirm: Confirmed 3. Have there been any falls? No 4. Has Home Health been to the house and/or have they contacted you? Yes 5. Are bowels and bladder emptying properly? Yes 6. Any fevers, problems with breathing, unexpected pain? No 7. Are there any skin problems or new areas of breakdown? No 8. Has the patient/family member arranged specialty MD follow up (ie cardiology/neurology/renal/surgical/etc)? Yes   9. Does the patient need any other services or support that we can help arrange? No 10. Are caregivers following through as expected in assisting the patient? Yes

## 2016-02-26 NOTE — Telephone Encounter (Signed)
Pharmacy called asking for Reesa Chew to call back to answer questions about Rx

## 2016-02-26 NOTE — Telephone Encounter (Signed)
Surie Smid has requested that [name] be retested for C-Diff. He stated he needs confirmation that the C-Diff is not active at the moment. Family has not been able to come over due to the possibility of C-Diff still being active. He would like a call back. Please advise

## 2016-02-27 ENCOUNTER — Other Ambulatory Visit: Payer: Self-pay

## 2016-02-27 MED ORDER — BACLOFEN 10 MG PO TABS: 10.0000 mg | ORAL_TABLET | Freq: Four times a day (QID) | ORAL | 3 refills | Status: DC

## 2016-02-27 NOTE — Telephone Encounter (Signed)
Judy Spears has called requesting a refill of medication baclofen. Medication has been refilled.  Judy Spears has been notified.

## 2016-02-27 NOTE — Telephone Encounter (Signed)
Have home health collect a sample to test

## 2016-02-27 NOTE — Therapy (Signed)
[facility] [address] [place], [place], [address] Phone: [phone]   Fax:  [phone]  Patient Details  Name: Judy Spears MRN: [id number] Date of Birth: 1950-03-09 Referring Provider:  No ref. provider found  Encounter Date: 02/27/2016  SPEECH THERAPY DISCHARGE SUMMARY  Visits from Start of Care: 34  Current functional level related to goals / functional outcomes: Comparatively, pt made most of her progress with attention over the course of ST, and some minor (maybe not altogether functional) gains with timing of her swallow-down to a delay of approx 7 seconds after bolus was introduced. Gains with voicing were minimal. She cont'd to require max/total A for adequate breath prior to attempts at speech or with repetition. Progress in each therapy session was widely variable due to medical factors, amount of sleep rec'd the previous night, activity the previous day/days, position of ST within pt's therapy day, etc. If therapy would have cont'd, SLP would have moved down to x2/week at time of next renewal (mid December). Pt's last enforced goals were as follows:  SLP Short Term Goals - 12/22/15 1627              SLP SHORT TERM GOAL #1    Title Pt will demonstrate audible phonation with words, vowels, or syllables in 4/10 trials with max A over three sessions    Baseline 10/30,    Status Revised         SLP SHORT TERM GOAL #2    Title Pt will demonstrate breath support with max A, prior to repetition of words 4/10    Status Achieved         SLP SHORT TERM GOAL #3    Title Pt will swallow ice chip/H20 via spoon in less than 7 seconds average with usual min to mod A 3/5 trials    Baseline 9/25, 10/30, 10/31    Status On-going/not met         SLP SHORT TERM GOAL #4    Title Pt will sustain attention to therapy task for over 7 minutes with occasional min A    Status Achieved                       SLP Long Term  Goals - 12/26/15 1733              SLP LONG TERM GOAL #1    Title Pt will imitate one to two syllables with voicing in upright position 50% of the time.    Time 2    Period Weeks    Status On-going         SLP LONG TERM GOAL #2    Title pt will demonstrate adequate breath support for speech 25% of the time with imitative responses and max cues usually (50-80%)    Time 2    Period Weeks    Status On-going         SLP LONG TERM GOAL #3    Title after oral care, pt will achieve initiation of oral phase of the swallow with ice chip/water trials with average <6 seconds over 4 sessions    Baseline 12-12-15    Time 2    Period Weeks    Status On-going      Remaining deficits: Significant deficits in dysarthria (breath support), dysphagia, and cognition. Deficits in receptive and expressive language very likely.   Education / Equipment: HEP, aspiration PNA s/s, possible need for  augmentative communication in the future, home tasks in cognition, swallowing, language, and speech.   Plan: Patient agrees to discharge.  Patient goals were not met.(LTGs) Patient is being discharged due to a change in medical status.(admitted).  ?????       [facility] ,MS, CCC-SLP  02/27/2016, 11:04 AM  [facility] [address] [place], [place], [address] Phone: [phone]   Fax:  [phone]

## 2016-02-27 NOTE — Telephone Encounter (Signed)
Spoke to patient home health aid.  It was determined that they were given and sample kit form the hospital.  If a sample it obtained then the kit will be taken to [facility] lab for processing.  The health aid will pass this message along to the patients husband as well.

## 2016-03-01 ENCOUNTER — Ambulatory Visit: Payer: BC Managed Care – PPO | Attending: Physical Medicine & Rehabilitation | Admitting: Occupational Therapy

## 2016-03-01 ENCOUNTER — Encounter: Payer: Self-pay | Admitting: Occupational Therapy

## 2016-03-01 ENCOUNTER — Ambulatory Visit: Payer: BC Managed Care – PPO | Admitting: Rehabilitation

## 2016-03-01 ENCOUNTER — Telehealth (HOSPITAL_COMMUNITY): Payer: Self-pay | Admitting: Physical Medicine and Rehabilitation

## 2016-03-01 ENCOUNTER — Ambulatory Visit: Payer: BC Managed Care – PPO | Admitting: Speech Pathology

## 2016-03-01 ENCOUNTER — Telehealth: Payer: Self-pay

## 2016-03-01 DIAGNOSIS — J189 Pneumonia, unspecified organism: Secondary | ICD-10-CM | POA: Diagnosis present

## 2016-03-01 DIAGNOSIS — R1312 Dysphagia, oropharyngeal phase: Secondary | ICD-10-CM | POA: Insufficient documentation

## 2016-03-01 DIAGNOSIS — R2689 Other abnormalities of gait and mobility: Secondary | ICD-10-CM | POA: Insufficient documentation

## 2016-03-01 DIAGNOSIS — M6281 Muscle weakness (generalized): Secondary | ICD-10-CM | POA: Diagnosis present

## 2016-03-01 DIAGNOSIS — R41844 Frontal lobe and executive function deficit: Secondary | ICD-10-CM | POA: Diagnosis present

## 2016-03-01 DIAGNOSIS — R29818 Other symptoms and signs involving the nervous system: Secondary | ICD-10-CM

## 2016-03-01 DIAGNOSIS — R471 Dysarthria and anarthria: Secondary | ICD-10-CM

## 2016-03-01 DIAGNOSIS — R41841 Cognitive communication deficit: Secondary | ICD-10-CM | POA: Insufficient documentation

## 2016-03-01 DIAGNOSIS — R2681 Unsteadiness on feet: Secondary | ICD-10-CM | POA: Diagnosis present

## 2016-03-01 DIAGNOSIS — M25651 Stiffness of right hip, not elsewhere classified: Secondary | ICD-10-CM | POA: Diagnosis present

## 2016-03-01 DIAGNOSIS — J9601 Acute respiratory failure with hypoxia: Secondary | ICD-10-CM | POA: Diagnosis present

## 2016-03-01 DIAGNOSIS — R278 Other lack of coordination: Secondary | ICD-10-CM | POA: Insufficient documentation

## 2016-03-01 DIAGNOSIS — M25622 Stiffness of left elbow, not elsewhere classified: Secondary | ICD-10-CM | POA: Diagnosis present

## 2016-03-01 DIAGNOSIS — M25612 Stiffness of left shoulder, not elsewhere classified: Secondary | ICD-10-CM | POA: Diagnosis present

## 2016-03-01 DIAGNOSIS — M25652 Stiffness of left hip, not elsewhere classified: Secondary | ICD-10-CM | POA: Diagnosis present

## 2016-03-01 DIAGNOSIS — R293 Abnormal posture: Secondary | ICD-10-CM

## 2016-03-01 DIAGNOSIS — M25611 Stiffness of right shoulder, not elsewhere classified: Secondary | ICD-10-CM

## 2016-03-01 NOTE — Telephone Encounter (Signed)
HHRN-Teresa [phone]) called to update on weekends events. Patient unable to tolerate tube feed at goal rate of 115 cc/hr. Had large amount of emesis with 200 cc residual. They held tube feed for a couple of hours and started it back at 80 cc/hr.  HOB not being elevated > 30 degrees at all times. They are also have been trying to increase water flushes to 200 -250 cc 5 X day with poor tolerance.  I recommended strict adherence to keeping HOB >/= 30 degrees and trying 80 cc/hr tonight. Asked her to call the office in am to update on tolerance.
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
## 2016-03-01 NOTE — Telephone Encounter (Signed)
[name] Ulery asking you to call  [phone] Lelan Pons)

## 2016-03-01 NOTE — Telephone Encounter (Signed)
Was called by [facility] Nurse today, Daryel November has started to vomit, stated that she was given a bolus of 250 on top of her tube feeding and vomited that up, happened a couple days ago.  States has since then vomited every time she has water and tube feed and is vomiting up the tube feeding fluids. Please advise

## 2016-03-01 NOTE — Therapy (Signed)
[facility] [address] [place], [place], [address] Phone: [phone]   Fax:  [phone]  Physical Therapy Evaluation  Patient Details  Name: Judy Spears MRN: [id number] Date of Birth: 1950-03-29 Referring Provider: Alger Simons, MD/Andrew Letta Pate, MD  Encounter Date: 03/01/2016      PT End of Session - 03/01/16 1551    Visit Number 1   Number of Visits 20   Date for PT Re-Evaluation 04/30/16   Authorization Type BCBS   PT Start Time 0933   PT Stop Time 1017   PT Time Calculation (min) 44 min   Activity Tolerance Patient tolerated treatment well   Behavior During Therapy [facility] for tasks assessed/performed      Past Medical History:  Diagnosis Date  . Arthritis    lt great toe  . Complication of anesthesia    pt has had headaches post op-did advise to hydra well preop  . Hair loss    right-sided  . History of exercise stress test    a. ETT (12/15) with 12:00 exercise, no ST changes, occasional PACs.   . Hyperlipidemia   . Insomnia   . Migraine headache   . 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 Postmenopausal   . Premature atrial contractions    a. Holter (12/15) with 8% PACs, no atrial runs or atrial fibrillation.     Past Surgical History:  Procedure Laterality Date  . BREAST BIOPSY  01/01/2011   Procedure: BREAST BIOPSY WITH NEEDLE LOCALIZATION;  Surgeon: Rolm Bookbinder, MD;  Location: [facility];  Service: General;  Laterality: Left;  left breast wire localization biopsy  . cataracts right eye    . CESAREAN SECTION  1986  . HERNIA REPAIR  2000   RIH  . opirectinal membrane peel    . RHINOPLASTY  1983    There were no vitals filed for this visit.       Subjective Assessment - 03/01/16 0936    Subjective Note pt back for second episode of care, they have been home a little less than a week.  They are doing stand pivot transfers at home without AFOs, bought new  shower equipment for ease of getting in/out of shower.  Using stedy to get from w/c to toilet and to other seating surfaces at home, using for exercise for standing.       Patient is accompained by: Family member  husband, [name], caregiver [name]   Pertinent History Precautions:  PEG.  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  Another round of botox in LLE on 02/06/16   Limitations House hold activities;Walking;Standing;Sitting   Patient Stated Goals Per husband, "For [name] to be as independent as possible."   Currently in Pain? No/denies            [facility] PT Assessment - 03/01/16 0935      Assessment   Medical Diagnosis ABI/encephalitis   Referring Provider Alger Simons, MD/Andrew Letta Pate, MD   Onset Date/Surgical Date 01/02/15  initial injury, readmitted 12/3 for ARF, IP 1/9-1/31   Prior Therapy acute, IP rehab and OP rehab at [facility], then [facility] OP neuro rehab, following re-admit acute, IP rehab and now OP neuro rehab again     Precautions   Precautions Fall   Precaution Comments PEG, seizure, PMSV  Required Braces or Orthoses Other Brace/Splint   Other Brace/Splint Has dynamic splint for LUE/LE that she wears for a few hours at a time     Restrictions   Weight Bearing Restrictions No   Other Position/Activity Restrictions Pt also has light weight static splint at night for LUE and dynamic splint for LLE about 4 hours per day.     Balance Screen   Has the patient fallen in the past 6 months No     [place] residence   Living Arrangements Spouse/significant other   Available Help at Discharge Family;Home health;Personal care attendant;Available 24 hours/day  Caregiver [name] and [facility] RNs   Type of [place] Access Stairs to enter   Entrance  Stairs-Number of Steps --  has stair lift   Home Layout Multi-level   Alternate Level Stairs-Number of Steps --  has stair lift   Home Equipment Wheelchair - manual  stedy, tub bench with sliding mechanism   Additional Comments Custom tilt-in-space w/c with Roho cushion      Prior Function   Level of Independence Independent  independent prior to initial injury   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      Cognition   Overall Cognitive Status Impaired/Different from baseline     Sensation   Light Touch Appears Intact   Hot/Cold Appears Intact   Proprioception Impaired by gross assessment   Additional Comments proprioception difficult to formally test due to cognition     Coordination   Gross Motor Movements are Fluid and Coordinated No   Fine Motor Movements are Fluid and Coordinated No   Coordination and Movement Description Continues to demonstrate poor grading and control of movement, however do note improvement in both standing and supine.     Heel Shin Test Impaired d/t ataxia, hypertonicity, rigidity     Posture/Postural Control   Posture/Postural Control Postural limitations   Postural Limitations Posterior pelvic tilt;Left pelvic obliquity;Rounded Shoulders;Forward head;Decreased lumbar lordosis;Weight shift left   Posture Comments Pt continues to demonstrate cervical spine and trunk laterally flexed to the L.  She is able to correct this with cues, but unable to maintain     AROM   Overall AROM  Deficits   Overall AROM Comments Pt able to get close to neutral extension in hips in supine, however still lags approx 10-15 deg in standing, however this could be due to spasticity.  Hip extension limited due to tightness/spasticity with PROM as well.       Strength   Overall Strength Deficits   Overall Strength Comments Note all strength tested to  best of ability in supine propped on blue wedge for comfort.   R hip flex 3/5, L hip flex 3-/5, R and L hip ext 4/5, R and L knee ext and flex difficult to test due to spasticity, however pt with intermittent R knee control in standing, did not note any L knee control in standing, R ankle DF/PF 3/5, L ankle PF/DF 2/5 (note LLE limited ROM as well)      Bed Mobility   Bed Mobility Rolling Right;Rolling Left;Sit to Supine;Left Sidelying to Sit   Rolling Right 2: Max assist  Note pt propped on wedge, no rail   Rolling Right Details (indicate cue type and reason) Cues for reaching across body, pt with good initiation of moving  leg following cues.     Rolling Left 2: Max assist   Rolling Left Details (indicate cue type and reason) same as above   Right Sidelying to Sit --   Left Sidelying to Sit 3: Mod assist;2: Max assist   Left Sidelying to Sit Details (indicate cue type and reason) Pt with good initiation of moving LEs off EOM when cued, however did need some assist.  Cues for elevating into sitting with assist from PT to initiate and then cues for increased R lateral weight shift.  pt able to perform with increased time to complete task.     Sit to Supine 3: Mod assist;HOB elevated   Sit to Supine - Details (indicate cue type and reason) Pt able to control trunk at least 85% and get RLE up onto mat, did require assist for elevating LLE onto mat.       Transfers   Transfers Sit to Stand;Stand to [employer] Transfers   Sit to Stand 3: Mod assist   Sit to Stand Details Tactile cues for placement;Tactile cues for weight shifting;Verbal cues for sequencing;Verbal cues for technique;Manual facilitation for weight shifting;Manual facilitation for weight bearing   Sit to Stand Details (indicate cue type and reason) Pt did well initiating forward trunk lean, however required increased assist for full forward trunk lean to WB on LEs prior to stand.    Stand to Sit 3: Mod assist   Stand to Sit Details  (indicate cue type and reason) Manual facilitation for weight shifting;Tactile cues for weight shifting;Verbal cues for technique;Tactile cues for sequencing;Tactile cues for initiation;Verbal cues for sequencing;Manual facilitation for weight bearing   Stand to Sit Details Cues for reaching back and increased forward trunk lean when sitting.    Stand Pivot Transfers 2: Max assist   Stand Pivot Transfer Details (indicate cue type and reason) Once in standing, pt requires heavy facilitation for weight shift in order to advance LEs in order to sit onto mat.  Pt able to move LLE somewhat easier as she had increased difficulty loading LLE to move RLE.       Ambulation/Gait   Ambulation/Gait Yes   Ambulation/Gait Assistance 1: +2 Total assist   Ambulation/Gait Assistance Details Performed 2 bouts of gait (15' x 1 and 12' x 1) with +2A ("three muskateer style).  PT on R side assisting with R HHA and to ensure RLE placement and to prevent R knee buckle.  PT on L with LUE around PTs shoulders assisting for upright posture and pelvis and chest, assist at L knee to prevent buckle and assist to ensure proper placement.  Note intermittent ability to attain semi upright position with increased hip extension and pt able to intermittently demonstrate R knee control in stance.  Feel that biggest issue may be motor planning, apraxia, proprioceptive deficits.  Note less active pulling into flexion and less tightness in LLE during gait as in previous episode of care.   w/ B AFOs   Ambulation Distance (Feet) 15 Feet  x 1, 12' x 1   Assistive device None;2 person hand held assist   Gait Pattern Step-through pattern;Decreased dorsiflexion - right;Decreased hip/knee flexion - right;Right flexed knee in stance;Left flexed knee in stance;Right foot flat;Left foot flat;Scissoring;Lateral trunk lean to left;Decreased weight shift to right;Narrow base of support;Poor foot clearance - right   Ambulation Surface Level;Indoor      Static Sitting Balance   Static Sitting - Balance Support Feet supported;Right upper extremity supported   Static Sitting -  Level of Assistance 4: Min assist;3: Mod assist   Static Sitting - Comment/# of Minutes Varying levels of assist for static sitting at EOM.  With cues for increaesd R lateral weight shift and head tilt, pt able to sit statically at S level for several seconds.       RLE Tone   RLE Tone Hypertonic;Other (comment)     RLE Tone   Hypertonic Details fluctuating tone with associated reactions when opposite extremity performing AROM/PROM     LLE Tone   LLE Tone Hypertonic     LLE Tone   Hypertonic Details fluctuating tone, associated reactions when opposite extremity/trunk AROM/PROM                           PT Education - 03/01/16 1549    Education provided Yes   Education Details Education on POC (2x for up to 8 weeks pending progress-as she is very similar functionally as she previously was in last episode of care), the need to demonstrate functional progress, maintanence therapy following PT   Person(s) Educated Patient;Spouse;Caregiver(s)   Methods Explanation   Comprehension Verbalized understanding          PT Short Term Goals - 03/01/16 1611      PT SHORT TERM GOAL #1   Title Pt will consistently perform supine rolling in B directions with min A (with rails as needed) in order to indicate increased trunk dissociation, increased independence with bed mobility.  (Target date for all STGs: 03/29/16)   Time 4   Period Weeks   Status New     PT SHORT TERM GOAL #2   Title Pt will consistently perform supine <> sit with min A to indicate increased independence getting out of bed.    Time 4   Period Weeks   Status New     PT SHORT TERM GOAL #3   Title Pt will demonstrate midline posture in static sitting with BLE support and RUE support at a S level to indicate increased postural awareness/control.     Time 4   Period Weeks   Status New      PT SHORT TERM GOAL #4   Title Pt will consistently perform functional transfers from w/c <> mat table with mod A to decrease caregiver burden.     Time 4   Period Weeks   Status New     PT SHORT TERM GOAL #5   Title Pt will consistently perform sit <> stand from personal w/c with BUE support and mod A to decrease caregiver burden.     Time 4   Period Weeks   Status New     PT SHORT TERM GOAL #6   Title Pt will perform static standing with BUE support x1 minute with supervision while in stedy, no overt LOB to decrease caregiver burden.    Time 4   Period Weeks   Status New           PT Long Term Goals - 03/01/16 1641      PT LONG TERM GOAL #1   Title Pt will perform supine <> sit with supervision 75% of the time to indicate increased independence getting out of bed.   (Target for all LTGs: 04/26/16)   Time 8   Period Weeks   Status New     PT LONG TERM GOAL #2   Title Pt will demonstrate midline posture in static sitting with BLE support without  UE support to indicate increased postural awareness/control.    Time 8   Period Weeks   Status New     PT LONG TERM GOAL #3   Title Pt will consistently perform functional transfers at min A level 75% of the time to decrease caregiver burden.     Time 8   Period Weeks   Status New     PT LONG TERM GOAL #4   Title Pt will perform sit <> stand from personal w/c with BUE support and min A, 75% of time to decrease caregiver burden.     Time 8   Period Weeks   Status New     PT LONG TERM GOAL #5   Title Pt will demonstrate ability to ambulate up to 5' w/ LRAD with max A in order to better assist with getting to/from restroom to decrease burden of care.    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  Note that she now returns to OP neuro following hospitalization from 12/28/15-02/25/16 due to acute respiratory failure and requiring intubation.  Pt admitted to [facility] IP unit D/C'd home on 1/31 with PMSV.  Upon PT evaluation, note improved forward weight shift, imroved initiation, decreased assistance for sit<>stand and stand pivot transfer, up to mod/max A.  Performed gait with +2A but would like to see how she looks with B PFRW on next visit.  Functionally she is close to her previous baseline, however does seem to have less LLE tightness and less active pulling into flexion postures.  Discussed in detail with pt, husband and caregiver that POC would be set for 2x/wk for 8 weeks due to her being close to where she was functionally before, the fact that we would need to see functional gains to continue to the 8 week mark, and to begin thinking long term for maintanence therapy following formal PT.  Pts husband reluctant but verbalized understanding during session.  Pt will benefit from skilled OP neuro PT to address said deficits.     Rehab Potential Fair   Clinical Impairments Affecting Rehab Potential Chronicity of condition, continued medical management, but has very strong family and financial support   PT Frequency 2x / week  then 3x/wk for 4 weeks if indicated, continue 2x/wk   PT Duration 4 weeks  see comment above   PT Treatment/Interventions ADLs/Self Care Home Management;DME Instruction;Gait training;Stair training;Functional mobility training;Therapeutic activities;Patient/family education;Cognitive remediation;Neuromuscular re-education;Therapeutic exercise;Balance training;Orthotic Fit/Training;Wheelchair mobility training;Manual techniques;Splinting;Visual/perceptual remediation/compensation;Vestibular;Passive range of  motion;Electrical Stimulation   PT Next Visit Plan bed mobility (see if she can tolerate 2-3 pillows instead of wedge), sitting balance, forward weight shift, transfers-standing transfers vs slideboard for increased independence, if you have help-gait with B PFRW, tall kneeling, sitting on physioball   Consulted and Agree with Plan of Care Patient;Family member/caregiver   Family Member Consulted caregiver, [name] and husband, [name]      Patient will benefit from skilled therapeutic intervention in order to improve the following deficits and impairments:  Abnormal gait, Decreased balance, Decreased endurance, Decreased cognition, Cardiopulmonary status limiting activity,  Decreased activity tolerance, Decreased coordination, Decreased strength, Impaired flexibility, Impaired tone, Decreased mobility, Increased muscle spasms, Impaired vision/preception, Decreased range of motion, Impaired UE functional use, Postural dysfunction, Improper body mechanics, Impaired perceived functional ability, Decreased knowledge of precautions, Decreased knowledge of use of DME  Visit Diagnosis: Unsteadiness on feet - Plan: PT plan of care cert/re-cert  Muscle weakness (generalized) - Plan: PT plan of care cert/re-cert  Abnormal posture - Plan: PT plan of care cert/re-cert  Other symptoms and signs involving the nervous system - Plan: PT plan of care cert/re-cert  Other abnormalities of gait and mobility - Plan: PT plan of care cert/re-cert  Stiffness of left hip, not elsewhere classified - Plan: PT plan of care cert/re-cert  Stiffness of right hip, not elsewhere classified - Plan: PT plan of care cert/re-cert  Other lack of coordination - Plan: PT plan of care cert/re-cert     Problem List Patient Active Problem List   Diagnosis Date Noted  . Acute on chronic respiratory failure with hypoxia ([place])   . Acute hypoxemic respiratory failure ([place])   . Acute blood loss anemia   . Atelectasis 02/04/2016  .  Debility 02/03/2016  . Pneumonia of both lower lobes due to Pseudomonas species ([place])   . Pneumonia of both lower lobes due to methicillin susceptible Staphylococcus aureus (MSSA) ([place])   . Acute respiratory failure with hypoxia ([place])   . Acute encephalopathy   . Seizures ([place])   . Sepsis ([place])   . Hypoxia   . Pain   . HCAP (healthcare-associated pneumonia) 12/28/2015  . Chronic respiratory failure ([place]) 12/28/2015  . Acute tracheobronchitis 12/24/2015  . Tracheostomy tube present ([place])   . Tracheal stenosis   . Chronic respiratory failure with hypoxia ([place])   . 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 Tracheostomy status ([place]) 09/26/2015  . Allergic rhinitis 09/26/2015  . 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 Movement disorder 09/22/2015  . Encephalitis 09/22/2015  . Chest pain 02/07/2014  . Premature atrial contractions 01/13/2014  . Abnormality of gait 12/04/2013  . Hyperlipidemia 12/21/2011  . Cardiovascular risk factor 12/21/2011  . Bunion 01/31/2008  . Metatarsalgia of both feet 09/20/2007  . FLAT FOOT 09/20/2007  . HALLUX RIGIDUS, ACQUIRED 09/20/2007    Cameron Sprang, PT, MPT [facility] [address] [place], [place], [address] Phone: [phone]   Fax:  [phone] 03/01/16, 4:53 PM  Name: Judy Spears MRN: [id number] Date of Birth: 09-29-1950

## 2016-03-01 NOTE — Therapy (Signed)
[facility] [address] [place], [place], [address] Phone: [phone]   Fax:  [phone]  Speech Language Pathology Evaluation  Patient Details  Name: Judy Spears MRN: [id number] Date of Birth: 08/29/1950 Referring Provider: Charlett Blake, MD  Encounter Date: 03/01/2016      End of Session - 03/01/16 2039    Visit Number 1   Number of Visits 17   Date for SLP Re-Evaluation 04/30/16   SLP Start Time 1103   SLP Stop Time  1145   SLP Time Calculation (min) 42 min   Activity Tolerance Patient tolerated treatment well      Past Medical History:  Diagnosis Date  . Arthritis    lt great toe  . Complication of anesthesia    pt has had headaches post op-did advise to hydra well preop  . Hair loss    right-sided  . History of exercise stress test    a. ETT (12/15) with 12:00 exercise, no ST changes, occasional PACs.   . Hyperlipidemia   . Insomnia   . Migraine headache   . 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 Postmenopausal   . Premature atrial contractions    a. Holter (12/15) with 8% PACs, no atrial runs or atrial fibrillation.     Past Surgical History:  Procedure Laterality Date  . BREAST BIOPSY  01/01/2011   Procedure: BREAST BIOPSY WITH NEEDLE LOCALIZATION;  Surgeon: Rolm Bookbinder, MD;  Location: [facility];  Service: General;  Laterality: Left;  left breast wire localization biopsy  . cataracts right eye    . CESAREAN SECTION  1986  . HERNIA REPAIR  2000   RIH  . opirectinal membrane peel    . RHINOPLASTY  1983    There were no vitals filed for this visit.      Subjective Assessment - 03/01/16 1115    Subjective Patient arrives with husband and caregiver for re-evaluation after recent hospitalization, CIR    Patient is accompained by: Family member   Special Tests spouse, [name]; caregiver [name]   Currently in Pain? 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 Initial serology was notable for increased [place] virus and [place] Nile virus IgG. 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 During her acute hospital course, initial MRI revealed hyperintensity in the bilateral caudate nuclei and follow up scan revealed T2 hyperintensity again in the caudate nuclei in addition to the basal ganglia, thalamus and midbrain. She was started on keppra for epileptiform activity on EEG. Due to her declining neuro status, the Mrs. Mclay was intubated and a PEG was placed for nutritional support on 01/13/15. She underwent a prolonged acute hospital course and subsequent inpatient rehab and day-program course through [facility] in [place], [place]. Pt initially seen for OP treatment at [facility] by SLP beginning 09/23/15 through 12/26/15. She was rehospitalized at [facility] 12/28/2015 for acute respiratory failure due to Pseudomonas and MSSA multilobar HCAP, with subsequent CIR at [facility] 02/05/16- 02/24/16. Pt remains on PEG TF with free water protocol, otherwise NPO. Currently with # 4 cuffless trach, tolerates PMSV during waking hours. Prior course of ST in OP rehab with limited functional outcomes noted with regard to swallow timing, voicing. Variable progress due to medical factors, amount of sleep rec'd the previous night, activity the previous day/days, position of ST within pt's therapy day.     Behavioral/Cognition Alert, cooperative   Mobility  Status uses a wheelchair      Prior Functional Status   Cognitive/Linguistic Baseline Within functional limits   Type of Home House    Lives With Spouse   Available Support Family;Personal care attendant   Education JD   Vocation On disability     Pain Assessment   Pain Assessment No/denies pain     Cognition   Overall Cognitive Status Impaired/Different from baseline   Current Attention Level Focused;Sustained  verbal cues required   Attention Focused;Sustained;Selective   Focused Attention Impaired   Focused Attention Impairment Verbal basic;Functional basic   Sustained Attention Impaired   Sustained Attention Impairment Verbal basic;Functional basic   Selective Attention Impaired   Selective Attention Impairment Verbal basic;Functional basic   Memory Impaired   Memory Impairment Decreased short term memory;Decreased recall of new information;Storage deficit   Decreased Short Term Memory Verbal basic;Functional basic   Awareness Impaired   Awareness Impairment --   Problem Solving Impaired   Problem Solving Impairment Verbal basic;Functional basic   Executive Function Reasoning;Decision Making;Self Monitoring;Organizing;Sequencing;Self Correcting;Initiating   Reasoning Impaired   Reasoning Impairment Verbal basic;Functional basic   Sequencing Impaired   Sequencing Impairment Verbal basic;Functional basic   Organizing Impaired   Organizing Impairment Verbal basic;Functional basic   Decision Making Impaired   Decision Making Impairment Verbal basic;Functional basic   Initiating Impaired   Initiating Impairment Verbal basic;Functional basic   Self Monitoring Impaired   Self Monitoring Impairment Verbal basic;Functional basic   Self Correcting Impaired   Self Correcting Impairment Verbal basic;Functional basic     Auditory Comprehension   Overall Auditory Comprehension Impaired at baseline   Yes/No Questions Within Functional Limits   Commands Impaired   One Step  Basic Commands 50-74% accurate   Conversation Simple   Interfering Components Attention;Processing speed;Working Biomedical scientist Comprehension Comments Physical impairments limit following of multi step commands.     Reading Comprehension   Reading Status Not tested     Expression   Primary Mode of Expression Verbal     Verbal Expression   Overall Verbal Expression Impaired   Initiation Impaired   Automatic Speech Name;Social Response   Level of Generative/Spontaneous Verbalization Word   Repetition Impaired   Level of Impairment Word level   Naming Not tested   Interfering Components Attention;Speech intelligibility;Anatomical limitation   Non-Verbal Means of Communication Gestures;Eye gaze     Written Expression   Dominant Hand Right   Written Expression Not tested     Oral Motor/Sensory Function   Overall Oral Motor/Sensory Function Impaired   Labial ROM Reduced right;Reduced left   Labial Sensation Reduced Left   Labial Coordination Reduced   Lingual ROM Reduced right   Lingual Strength Reduced   Lingual Coordination Reduced   Facial ROM Reduced right;Reduced left     Motor Speech   Overall  Motor Speech Impaired   Respiration Impaired   Level of Impairment Word   Phonation Low vocal intensity;Wet;Aphonic;Breathy;Hoarse   Resonance Within functional limits   Articulation Impaired   Level of Impairment Word   Intelligibility Intelligibility reduced   Word 50-74% accurate   Phrase 50-74% accurate   Sentence Not tested   Conversation Not tested   Motor Planning Witnin functional limits   Phonation Impaired   Volume Soft     Standardized Assessments   Standardized Assessments  Other Assessment     Plan   Duration --  8 weeks or 16 visits   Potential to Achieve Goals (ACUTE ONLY) Fair   Potential Considerations (ACUTE ONLY) Severity of impairments;Previous level of function;Co-morbidities;Ability to  learn/carryover information;Cooperation/participation level;Other (comment)  time post-onset     Individuals Consulted   Consulted and Agree with Results and Recommendations Patient;Family member/caregiver   Family Member Consulted  Caregiver [name] and husband                          SLP Education - 03/01/16 2035    Education provided Yes   Education Details oral care prior to ice chip/H2o, POC 2x for up to 8 weeks pending progress, need to demonstrate functional progress, establishing functional communication system (AAC), maintenance program following ST   Person(s) Educated Patient;Spouse;Caregiver(s)   Methods Explanation   Comprehension Need further instruction;Verbalized understanding          SLP Short Term Goals - 03/01/16 2040      SLP SHORT TERM GOAL #1   Title Patient will utilize multimodal communication/AAC to spell word level responses in 80% of opportunites with max A in 2 therapy sessions.   Time 4   Period Weeks   Status New     SLP SHORT TERM GOAL #2   Title Patient will utilize intelligibility strategies, breath support with mod A for phrase repetition in 75% of trials.   Time 4   Period Weeks   Status New     SLP SHORT TERM GOAL #3   Title Pt will swallow ice chip/H20 via spoon in less than 5 seconds average with usual min to mod A 4/5 trials   Time 4   Period Weeks   Status On-going          SLP Long Term Goals - 03/01/16 2051      SLP LONG TERM GOAL #1   Title Patient will utilize multimodal communication/AAC to clarify wants, needs, thoughts or ideas during communication breakdowns with max A   Time 8   Period Weeks   Status New     SLP LONG TERM GOAL #2   Title Patient will utilize intelligibility strategies, breath support with min A for 5 word functional phrases with 80% accuracy.   Time 8   Period Weeks   Status New     SLP LONG TERM GOAL #3   Title Patient will demonstrate readiness for MBS to determine safety for  pleasure feeds by timely initiation of swallow in 3/5 trials with ice chips/H20.   Time 4   Period Weeks   Status New          Plan - 03/01/16 1115    Clinical Impression Statement Patient presents with significant deficits in speech intelligibility (dysarthria, reduced breath support), cognitive communication and swallowing. Patient also likely has receptive and expressive language deficits which are difficult to formally evaluate given severity of cognitive deficits, reduced intelligibility, and  restricted mobility. Patient was alert and maintained eye contact during the 45 minute assessment with intermittent verbal cues. She appeared eager to participate in PO trials of ice, water following oral care. Swallow initiation appears more timely than noted in prior evaluations. With ice chips, swallow delay averages ~5 seconds. No overt signs of aspiration noted with 6 trials of ice; vocal quality is clear, no throat clearing, coughing. Hyolaryngeal excursion appears adequate to palpation. Presented three 1/2 teaspoon trials of water. Following 3rd trial, patient presented with immediate change in vocal quality, decreased vocal amplitude/phonation, throat clearing followed by wet cough. With max A, she was able to cough/reswallow with slight improvement in vocal quality noted. Husband states they have been "flavoring" ice chips with orange juice at home. Recommended that given recent hospitalization for pneumonia, pt continue with PO trials of ice and water only. Pt's husband states that overall, patient's voicing has "gotten stronger." With max A she was able to convey novel information to this previously unknown therapist at the word level by simultaneously spelling aloud and tracing letters with her right hand. During dynamic assessment of communication letterboard, patient required max A and tactile support to point to 3 letters. Patient had difficulty visually attending to the board. Recommend skilled ST  to address deficits in swallowing, breath support, dysarthria, expressive language in order to maxmize functional communication, swallowing safety, reduce caregiver burden and improve quality of life.    Speech Therapy Frequency 2x / week   Treatment/Interventions Aspiration precaution training;Trials of upgraded texture/liquids;Compensatory strategies;Functional tasks;Patient/family education;Cueing hierarchy;Diet toleration management by SLP;Cognitive reorganization;Compensatory techniques;SLP instruction and feedback;Internal/external aids;Multimodal communcation approach   Potential to Achieve Goals Fair   Potential Considerations Ability to learn/carryover information;Co-morbidities;Severity of impairments   SLP Home Exercise Plan PO trials of ice/H20 following oral care, continue utilizing multimodal techniques to improve relay of information   Consulted and Agree with Plan of Care Family member/caregiver;Patient   Family Member Consulted caregiver [name], husband [name]      Patient will benefit from skilled therapeutic intervention in order to improve the following deficits and impairments:   Frontal lobe and executive function deficit  Dysarthria and anarthria  Dysphagia, oropharyngeal phase  Cognitive communication deficit  Acute hypoxemic respiratory failure ([place])  HCAP (healthcare-associated pneumonia)      G-Codes - 2016/03/03 04/27/2126    Functional Assessment Tool Used NOMS 2   Functional Limitations Spoken language expressive   Spoken Language Expression Current Status [id number]) At least 80 percent but less than 100 percent impaired, limited or restricted   Spoken Language Expression Goal Status [id number]) At least 60 percent but less than 80 percent impaired, limited or restricted      Problem List Patient Active Problem List   Diagnosis Date Noted  . Acute on chronic respiratory failure with hypoxia ([place])   . Acute hypoxemic respiratory failure ([place])   . Acute blood loss  anemia   . Atelectasis 02/04/2016  . Debility 02/03/2016  . Pneumonia of both lower lobes due to Pseudomonas species ([place])   . Pneumonia of both lower lobes due to methicillin susceptible Staphylococcus aureus (MSSA) ([place])   . Acute respiratory failure with hypoxia ([place])   . Acute encephalopathy   . Seizures ([place])   . Sepsis ([place])   . Hypoxia   . Pain   . HCAP (healthcare-associated pneumonia) 12/28/2015  . Chronic respiratory failure ([place]) 12/28/2015  . Acute tracheobronchitis 12/24/2015  . Tracheostomy tube present ([place])   . Tracheal stenosis   . Chronic respiratory  failure with hypoxia ([place])   . 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 Tracheostomy status ([place]) 09/26/2015  . Allergic rhinitis 09/26/2015  . 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 Movement disorder 09/22/2015  . Encephalitis 09/22/2015  . Chest pain 02/07/2014  . Premature atrial contractions 01/13/2014  . Abnormality of gait 12/04/2013  . Hyperlipidemia 12/21/2011  . Cardiovascular risk factor 12/21/2011  . Bunion 01/31/2008  . Metatarsalgia of both feet 09/20/2007  . FLAT FOOT 09/20/2007  . HALLUX RIGIDUS, ACQUIRED 09/20/2007   Deneise Lever, [place] CF-SLP Speech-Language Pathologist  Aliene Altes 03/01/2016, 9:30 PM  [facility] [address] [place], [place], [address] Phone: [phone]   Fax:  [phone]  Name: ASHARIA BOLING MRN: [id number] Date of Birth: 1950-06-05

## 2016-03-02 NOTE — Telephone Encounter (Signed)
I have spoken with the [facility] and given orders

## 2016-03-04 ENCOUNTER — Ambulatory Visit: Payer: BC Managed Care – PPO | Admitting: Occupational Therapy

## 2016-03-04 ENCOUNTER — Encounter: Payer: Self-pay | Admitting: Occupational Therapy

## 2016-03-04 ENCOUNTER — Ambulatory Visit: Payer: BC Managed Care – PPO | Admitting: Speech Pathology

## 2016-03-04 ENCOUNTER — Ambulatory Visit: Payer: BC Managed Care – PPO | Admitting: Physical Therapy

## 2016-03-04 DIAGNOSIS — M6281 Muscle weakness (generalized): Secondary | ICD-10-CM

## 2016-03-04 DIAGNOSIS — R293 Abnormal posture: Secondary | ICD-10-CM

## 2016-03-04 DIAGNOSIS — R471 Dysarthria and anarthria: Secondary | ICD-10-CM

## 2016-03-04 DIAGNOSIS — M25622 Stiffness of left elbow, not elsewhere classified: Secondary | ICD-10-CM

## 2016-03-04 DIAGNOSIS — R29818 Other symptoms and signs involving the nervous system: Secondary | ICD-10-CM

## 2016-03-04 DIAGNOSIS — R2689 Other abnormalities of gait and mobility: Secondary | ICD-10-CM

## 2016-03-04 DIAGNOSIS — R41844 Frontal lobe and executive function deficit: Secondary | ICD-10-CM

## 2016-03-04 DIAGNOSIS — M25612 Stiffness of left shoulder, not elsewhere classified: Secondary | ICD-10-CM

## 2016-03-04 DIAGNOSIS — M25611 Stiffness of right shoulder, not elsewhere classified: Secondary | ICD-10-CM

## 2016-03-04 DIAGNOSIS — R1312 Dysphagia, oropharyngeal phase: Secondary | ICD-10-CM

## 2016-03-04 DIAGNOSIS — R2681 Unsteadiness on feet: Secondary | ICD-10-CM

## 2016-03-04 DIAGNOSIS — R41841 Cognitive communication deficit: Secondary | ICD-10-CM

## 2016-03-04 NOTE — Therapy (Signed)
[facility] [address] [place], [place], [address] Phone: [phone]   Fax:  [phone]  Occupational Therapy Treatment  Patient Details  Name: Judy Spears MRN: [id number] Date of Birth: 1950-04-03 Referring Provider: Dr. Quita Skye. Judy Spears  Encounter Date: 03/04/2016      OT End of Session - 03/04/16 1637    Visit Number 2   Number of Visits 16   Date for OT Re-Evaluation 04/26/16   Authorization Type [employer] unlimited visits   OT Start Time [id number]   OT Stop Time 1624   OT Time Calculation (min) 53 min   Activity Tolerance Patient tolerated treatment well      Past Medical History:  Diagnosis Date  . Arthritis    lt great toe  . Complication of anesthesia    pt has had headaches post op-did advise to hydra well preop  . Hair loss    right-sided  . History of exercise stress test    a. ETT (12/15) with 12:00 exercise, no ST changes, occasional PACs.   . Hyperlipidemia   . Insomnia   . Migraine headache   . 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 Postmenopausal   . Premature atrial contractions    a. Holter (12/15) with 8% PACs, no atrial runs or atrial fibrillation.     Past Surgical History:  Procedure Laterality Date  . BREAST BIOPSY  01/01/2011   Procedure: BREAST BIOPSY WITH NEEDLE LOCALIZATION;  Surgeon: Rolm Bookbinder, MD;  Location: [facility];  Service: General;  Laterality: Left;  left breast wire localization biopsy  . cataracts right eye    . CESAREAN SECTION  1986  . HERNIA REPAIR  2000   RIH  . opirectinal membrane peel    . RHINOPLASTY  1983    There were no vitals filed for this visit.      Subjective Assessment - 03/04/16 1534    Subjective  Pointed to foot when asked if anything hurt (pt had just walked in PT)   Pertinent History see epic snapshot - ABI/encephalitis/hypoxia from virus; recent hospitalization due to ARF;    Patient Stated Goals Pt  unable to state.    Currently in Pain? Yes   Pain Score --  unable to rate   Pain Location Foot   Pain Orientation Right  ball of her foot from walking in PT   Pain Descriptors / Indicators Sore   Pain Type Acute pain   Pain Onset Today   Aggravating Factors  from walking   Pain Relieving Factors stretching                      OT Treatments/Exercises (OP) - 03/04/16 0001      Splinting   Splinting Fabricated modified wrist cock up to enable pt to use L hand more functionally.  Due to dystonic posturing, pt postures L wrist in severe ulnar deviation with wrist flexion when attempting to use hand;  wrist cock up allows pt to remain in neutral and to open hand easily for improved functional use.  Explained to caregiver and husband that this is a training splint and should be worn when Judy Spears is attempting to use her L hand;  splint should not be worn at night.  Reviewed wear and care and caregiver and husband able to verbalize understanding.  Fabricated pattern for resting hand splint for R hand will complete next session.  OT Education - 03/04/16 1634    Education provided Yes   Education Details splint wear and care for L cock up splint   Person(s) Educated Patient;Spouse;Caregiver(s)   Methods Explanation;Demonstration;Handout   Comprehension Verbalized understanding          OT Short Term Goals - 03/04/16 1635      OT SHORT TERM GOAL #1   Title Pt, husband and cargiver will be mod I with HEP - 03/29/2016   Status On-going     OT SHORT TERM GOAL #2   Title Pt will be able to sit with supervision without UE support and vc's to assist with midline 75% while engage in functional task   Status On-going     OT [place] #3   Title Pt will be able to complete sit to stand consistently with mod A for toilet transfers (max a for pivot)   Status On-going     OT SHORT TERM GOAL #4   Title Pt, husband and caregiver will verbalize  understanding of various bathroom accomodations to reduce long term caregiver burden   Status On-going           OT Long Term Goals - 03/04/16 1635      OT LONG TERM GOAL #1   Title Pt, husband and caregiver will be mod I with upgraded HEP prn - 05/26/2016   Status On-going     OT LONG TERM GOAL #2   Title Pt, husband and caregiver will be mod I with splint wear and care    Status On-going     OT LONG TERM GOAL #3   Title Pt will be able to wash face with min a only   Status On-going     OT LONG TERM GOAL #4   Title Pt will be able to sit without UE support with supevision 100% of the time with no more than 2 vc's while engaged in functional activity.   Status On-going     OT LONG TERM GOAL #5   Title Pt, husband and caregiver will verbalize understanding of potential after care therapeutic opportunities.    Status On-going               Plan - 03/04/16 1635    Clinical Impression Statement Pt progressing toward goals.  Pt more alert today overall however still requires stimulation to attend.  Pt with improved sitting posture in regular chair.    Rehab Potential Fair   Clinical Impairments Affecting Rehab Potential severity of deficits, very slow rate of recovery   OT Frequency 2x / week   OT Duration 8 weeks   OT Treatment/Interventions Self-care/ADL training;Ultrasound;Moist Heat;Electrical Stimulation;DME and/or AE instruction;Neuromuscular education;Therapeutic exercise;Functional Mobility Training;Manual Therapy;Passive range of motion;Splinting;Therapeutic activities;Balance training;Patient/family education;Visual/perceptual remediation/compensation;Cognitive remediation/compensation   Plan check on L splint, fabricate R resting hand splint, further assess sitting balance, sit to stand, standing, transfers   Consulted and Agree with Plan of Care Patient;Family member/caregiver   Family Member Consulted caregiver [name], husband [name]       Patient will benefit  from skilled therapeutic intervention in order to improve the following deficits and impairments:  Decreased endurance, Decreased skin integrity, Impaired vision/preception, Improper body mechanics, Impaired perceived functional ability, Decreased knowledge of precautions, Cardiopulmonary status limiting activity, Decreased activity tolerance, Decreased knowledge of use of DME, Decreased strength, Impaired flexibility, Improper spinal/pelvic alignment, Impaired sensation, Difficulty walking, Decreased mobility, Decreased balance, Decreased cognition, Decreased range of motion, Impaired tone, Impaired UE functional use, Decreased  safety awareness, Decreased coordination, Pain  Visit Diagnosis: Frontal lobe and executive function deficit  Muscle weakness (generalized)  Unsteadiness on feet  Abnormal posture  Other symptoms and signs involving the nervous system  Stiffness of left shoulder, not elsewhere classified  Stiffness of right shoulder, not elsewhere classified  Stiffness of left elbow, not elsewhere classified    Problem List Patient Active Problem List   Diagnosis Date Noted  . Acute on chronic respiratory failure with hypoxia ([place])   . Acute hypoxemic respiratory failure ([place])   . Acute blood loss anemia   . Atelectasis 02/04/2016  . Debility 02/03/2016  . Pneumonia of both lower lobes due to Pseudomonas species ([place])   . Pneumonia of both lower lobes due to methicillin susceptible Staphylococcus aureus (MSSA) ([place])   . Acute respiratory failure with hypoxia ([place])   . Acute encephalopathy   . Seizures ([place])   . Sepsis ([place])   . Hypoxia   . Pain   . HCAP (healthcare-associated pneumonia) 12/28/2015  . Chronic respiratory failure ([place]) 12/28/2015  . Acute tracheobronchitis 12/24/2015  . Tracheostomy tube present ([place])   . Tracheal stenosis   . Chronic respiratory failure with hypoxia ([place])   . 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 Tracheostomy status ([place])  09/26/2015  . Allergic rhinitis 09/26/2015  . 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 Movement disorder 09/22/2015  . Encephalitis 09/22/2015  . Chest pain 02/07/2014  . Premature atrial contractions 01/13/2014  . Abnormality of gait 12/04/2013  . Hyperlipidemia 12/21/2011  . Cardiovascular risk factor 12/21/2011  . Bunion 01/31/2008  . Metatarsalgia of both feet 09/20/2007  . FLAT FOOT 09/20/2007  . HALLUX RIGIDUS, ACQUIRED 09/20/2007    Quay Burow, OTR/L 03/04/2016, 4:41 PM  [facility] [address] [place], [place], [address] Phone: [phone]   Fax:  [phone]  Name: Judy Spears MRN: [id number] Date of Birth: [birth year]

## 2016-03-04 NOTE — Therapy (Signed)
[facility] [address] [place], [place], [address] Phone: [phone]   Fax:  [phone]  Speech Language Pathology Treatment  Patient Details  Name: Judy Spears MRN: [id number] Date of Birth: 1950-09-30 Referring Provider: Charlett Blake, MD  Encounter Date: 03/04/2016      End of Session - 03/04/16 1717    Visit Number 2   Number of Visits 17   Date for SLP Re-Evaluation 04/30/16   SLP Start Time 57   SLP Stop Time  1704   SLP Time Calculation (min) 39 min   Activity Tolerance Patient tolerated treatment well      Past Medical History:  Diagnosis Date  . Arthritis    lt great toe  . Complication of anesthesia    pt has had headaches post op-did advise to hydra well preop  . Hair loss    right-sided  . History of exercise stress test    a. ETT (12/15) with 12:00 exercise, no ST changes, occasional PACs.   . Hyperlipidemia   . Insomnia   . Migraine headache   . 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 Postmenopausal   . Premature atrial contractions    a. Holter (12/15) with 8% PACs, no atrial runs or atrial fibrillation.     Past Surgical History:  Procedure Laterality Date  . BREAST BIOPSY  01/01/2011   Procedure: BREAST BIOPSY WITH NEEDLE LOCALIZATION;  Surgeon: Rolm Bookbinder, MD;  Location: [facility];  Service: General;  Laterality: Left;  left breast wire localization biopsy  . cataracts right eye    . CESAREAN SECTION  1986  . HERNIA REPAIR  2000   RIH  . opirectinal membrane peel    . RHINOPLASTY  1983    There were no vitals filed for this visit.      Subjective Assessment - 03/04/16 1627    Subjective Been working on "slowing down, big expressions" for speech   Special Tests spouse, [name]; caregiver [name]               ADULT SLP TREATMENT - 03/04/16 0001      General Information   Behavior/Cognition Cooperative;Pleasant  mood;Lethargic;Distractible;Decreased sustained attention     Treatment Provided   Treatment provided Cognitive-Linquistic;Dysphagia     Dysphagia Treatment   Oral Cavity - Dentition Adequate natural dentition   Treatment Methods Skilled observation;Therapeutic exercise   Patient observed directly with PO's Yes   Type of PO's observed Ice chips   Feeding Needs assist;Total assist   Liquids provided via Teaspoon   Oral Phase Signs & Symptoms Prolonged bolus formation   Pharyngeal Phase Signs & Symptoms Suspected delayed swallow initiation   Type of cueing Verbal;Tactile;Visual   Amount of cueing Moderate   Other treatment/comments SLP performed oral care; pt with mildly thickened white secretions, frequent gag response to lingual/palatal stimulation. Assisted patient with self-feeding of ice chips via teaspoon; mildly prolonged mastication/ bolus formation, swallow initiation ~5 seconds after appearance of bolus transit. Vocal quality is strong and clear upon phonation, 6/6 trials. With verbal cue, patient completes immediate dry swallow within 1 second. Delayed cough noted x1 approximately 2-3 minutes after ice chip trials.      Pain Assessment   Pain Assessment Faces   Faces Pain Scale Hurts little more   Pain Location ball of toes    Pain Descriptors / Indicators Discomfort   Pain Intervention(s) Monitored during session  Cognitive-Linquistic Treatment   Treatment focused on Voice;Dysarthria;Patient/family/caregiver education;Other (comment)  communication modalities   Skilled Treatment Patient, with max cues for breath support, increased volume and slowed rate, produced intelligible 3-5 word phrases, including, "more ice please," "wipe my mouth," "I could probably do that," and "red headed woodpecker." SLP facilitated multimodal communication training for patient and caregivers via use of a white board placed across front of pt's wheel chair. With max verbal cues to increase size of  letters and verbalize simultaneously, patient spelled words by tracing letters with her right hand. SLP confirmed and wrote letters as message was received. Patient required verbal cues to confirm/monitor accuracy of message received. Instructed patient and family to use this modeled strategy at home to verify messages: write to confirm message received, and to provide visual redirection to encourage patient's awareness of message as it is received. Instructed pt, husband and caregiver to generate list of 3-5 word functional phrases for intelligibility/dysarthria training.     Tracheostomy Shiley 4 mm Uncuffed   Tracheostomy Properties Placement Date: 02/24/16 Placement Time: [place] By: ICU physician Brand: Shiley Size (mm): 4 mm Style: Uncuffed   Status Secured;Passy Muir Speaking valve   Site Assessment Clean;Dry     Assessment / Recommendations / Plan   Plan Continue with current plan of care     Dysphagia Recommendations   Diet recommendations NPO;Other(comment)  ice chips, water after oral care   Liquids provided via Teaspoon   Medication Administration Via alternative means   Supervision Full supervision/cueing for compensatory strategies   Compensations Multiple dry swallows after each bite/sip;Effortful swallow     General Recommendations   Oral Care Recommendations Oral care prior to ice chips;Staff/trained caregiver to provide oral care;Oral care Q4 per protocol     Progression Toward Goals   Progression toward goals Progressing toward goals          SLP Education - 03/04/16 1715    Education provided Yes   Education Details Verifying message with use of communication board   Person(s) Educated Patient;Spouse;Caregiver(s)   Methods Explanation;Demonstration;Tactile cues   Comprehension Verbalized understanding;Verbal cues required          SLP Short Term Goals - 03/04/16 1738      SLP SHORT TERM GOAL #1   Title Patient will utilize multimodal communication/AAC  to spell word level responses in 80% of opportunites with max A in 2 therapy sessions.   Time 4   Period Weeks   Status On-going     SLP SHORT TERM GOAL #2   Title Patient will utilize intelligibility strategies, breath support with mod A for phrase repetition in 75% of trials.   Time 4   Period Weeks   Status On-going     SLP SHORT TERM GOAL #3   Title Pt will swallow ice chip/H20 via spoon in less than 5 seconds average with usual min to mod A 4/5 trials   Time 4   Period Weeks   Status On-going          SLP Long Term Goals - 03/04/16 1739      SLP LONG TERM GOAL #1   Title Patient will utilize multimodal communication/AAC to clarify wants, needs, thoughts or ideas during communication breakdowns with max A   Time 8   Period Weeks   Status On-going     SLP LONG TERM GOAL #2   Title Patient will utilize intelligibility strategies, breath support with min A for 5 word functional phrases with 80%  accuracy.   Time 8   Period Weeks   Status On-going     SLP LONG TERM GOAL #3   Title Patient will demonstrate readiness for MBS to determine safety for pleasure feeds by timely initiation of swallow in 3/5 trials with ice chips/H20.   Time 4   Period Weeks   Status On-going          Plan - 03/04/16 1731    Clinical Impression Statement Patient intermittently lethargic during today's session, required intermittent verbal cues for alertness with increasing frequency as session progressed. During dysphagia treatment, patient alert and with moderate cues, uses phrases to request additional trials of ice chips. Swallow continues to average ~5 seconds, and with verbal cues patient ititiates a second swallow promptly, within 1 second. Shows good potential for use of whiteboard for AAC; pt spelled 3 letter words and her full name with verbal cues to increase size of traced letter and to monitor for receipt of message. Recommend skilled ST in order to maximize functional communication,  swallowing safety, reduce caregiver burden and improve quality of life.   Speech Therapy Frequency 2x / week   Duration Other (comment)   Treatment/Interventions Aspiration precaution training;Trials of upgraded texture/liquids;Compensatory strategies;Functional tasks;Patient/family education;Cueing hierarchy;Diet toleration management by SLP;Cognitive reorganization;Compensatory techniques;SLP instruction and feedback;Internal/external aids;Multimodal communcation approach   Potential to Achieve Goals Fair   Potential Considerations Ability to learn/carryover information;Co-morbidities;Severity of impairments   SLP Home Exercise Plan PO trials of ice/H20 following oral care, continue utilizing multimodal techniques to improve relay of information, functional phrases   Consulted and Agree with Plan of Care Family member/caregiver;Patient   Family Member Consulted caregiver [name], husband [name]      Patient will benefit from skilled therapeutic intervention in order to improve the following deficits and impairments:   Frontal lobe and executive function deficit  Dysarthria and anarthria  Dysphagia, oropharyngeal phase  Cognitive communication deficit    Problem List Patient Active Problem List   Diagnosis Date Noted  . Acute on chronic respiratory failure with hypoxia ([facility])   . Acute hypoxemic respiratory failure ([facility])   . Acute blood loss anemia   . Atelectasis 02/04/2016  . Debility 02/03/2016  . Pneumonia of both lower lobes due to Pseudomonas species ([facility])   . Pneumonia of both lower lobes due to methicillin susceptible Staphylococcus aureus (MSSA) ([facility])   . Acute respiratory failure with hypoxia ([facility])   . Acute encephalopathy   . Seizures ([facility])   . Sepsis ([facility])   . Hypoxia   . Pain   . HCAP (healthcare-associated pneumonia) 12/28/2015  . Chronic respiratory failure ([facility]) 12/28/2015  . Acute tracheobronchitis 12/24/2015  . Tracheostomy tube present ([facility])   . Tracheal  stenosis   . Chronic respiratory failure with hypoxia ([facility])   . 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[facility]) 10/20/2015  . Tracheostomy status ([place]) 09/26/2015  . Allergic rhinitis 09/26/2015  . 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 Movement disorder 09/22/2015  . Encephalitis 09/22/2015  . Chest pain 02/07/2014  . Premature atrial contractions 01/13/2014  . Abnormality of gait 12/04/2013  . Hyperlipidemia 12/21/2011  . Cardiovascular risk factor 12/21/2011  . Bunion 01/31/2008  . Metatarsalgia of both feet 09/20/2007  . FLAT FOOT 09/20/2007  . HALLUX RIGIDUS, ACQUIRED 09/20/2007   Deneise Lever, [place] CF-SLP Speech-Language Pathologist  Aliene Altes 03/04/2016, 5:42 PM  [facility] [address] [place], [place], [address] Phone: [phone]   Fax:  [phone]   Name: Asencion Gowda  Brueck MRN: [id number] Date of Birth: 05/08/1950

## 2016-03-04 NOTE — Patient Instructions (Signed)
Your Splint This splint should initially be fitted by a healthcare practitioner.  The healthcare practitioner is responsible for providing wearing instructions and precautions to the patient, other healthcare practitioners and care provider involved in the patient's care.  This splint was custom made for you. Please read the following instructions to learn about wearing and caring for your splint.  Precautions Should your splint cause any of the following problems, remove the splint immediately and contact your therapist/physician.  Swelling  Severe Pain  Pressure Areas  Stiffness  Numbness  Do not wear your splint while operating machinery unless it has been fabricated for that purpose.  When To Wear Your Splint Where your splint according to your therapist/physician instructions. Daytime for 1-2 hours  2 times per day  while Judy Spears is using her hand.  Start with only 20 minutes and work your way slowly up to the 2 hour block time.  This is training splint. It should not be worn at night.   Care and Cleaning of Your Splint 1. Keep your splint away from open flames. 2. Your splint will lose its shape in temperatures over 135 degrees Farenheit, ( in car windows, near radiators, ovens or in hot water).  Never make any adjustments to your splint, if the splint needs adjusting remove it and make an appointment to see your therapist. 3. Your splint, including the cushion liner may be cleaned with soap and lukewarm water.  Do not immerse in hot water over 135 degrees Farenheit. 4. Straps may be washed with soap and water, but do not moisten the self-adhesive portion. 5. For ink or hard to remove spots use a scouring cleanser which contains chlorine.  Rinse the splint thoroughly after using chlorine cleanser.

## 2016-03-05 NOTE — Therapy (Signed)
[facility] [address] [place], [place], [address] Phone: [phone]   Fax:  [phone]  Physical Therapy Treatment  Patient Details  Name: Judy Spears MRN: [id number] Date of Birth: 10-Jul-1950 Referring Provider: Alger Simons, MD/Andrew Letta Pate, MD  Encounter Date: 03/04/2016      PT End of Session - 03/05/16 1455    Visit Number 2   Number of Visits 20   Date for PT Re-Evaluation 04/30/16   Authorization Type BC[employer]   PT Start Time [id number]   PT Stop Time 1448   PT Time Calculation (min) 45 min      Past Medical History:  Diagnosis Date  . Arthritis    lt great toe  . Complication of anesthesia    pt has had headaches post op-did advise to hydra well preop  . Hair loss    right-sided  . History of exercise stress test    a. ETT (12/15) with 12:00 exercise, no ST changes, occasional PACs.   . Hyperlipidemia   . Insomnia   . Migraine headache   . 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 Postmenopausal   . Premature atrial contractions    a. Holter (12/15) with 8% PACs, no atrial runs or atrial fibrillation.     Past Surgical History:  Procedure Laterality Date  . BREAST BIOPSY  01/01/2011   Procedure: BREAST BIOPSY WITH NEEDLE LOCALIZATION;  Surgeon: Rolm Bookbinder, MD;  Location: [facility];  Service: General;  Laterality: Left;  left breast wire localization biopsy  . cataracts right eye    . CESAREAN SECTION  1986  . HERNIA REPAIR  2000   RIH  . opirectinal membrane peel    . RHINOPLASTY  1983    There were no vitals filed for this visit.      Subjective Assessment - 03/05/16 1438    Subjective No new c/o's or problems since initial PT eval on Monday of this week   Patient is accompained by: Family member   Pertinent History Precautions:  PEG.  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  Another round of botox in LLE on 02/06/16   Patient Stated Goals Per husband, "For [place] to be as independent as possible."   Currently in Pain? Other (Comment)  None reported                         [facility] Adult PT Treatment/Exercise - 03/05/16 0001      Transfers   Transfers Sit to Stand;Stand to Sit;Stand Pivot Transfers   Sit to Stand 3: Mod assist;2: Max assist   Sit to Stand Details Tactile cues for sequencing;Verbal cues for technique;Tactile cues for posture;Tactile cues for weight shifting   Sit to Stand Details (indicate cue type and reason) Pt needs max assist for weight shifting and to move LLE during turn of transfer   Stand to Sit 3: Mod assist;2: Max assist  max assist needed after amb. - due to fatigue   Stand to Sit Details (indicate cue type and reason) Manual facilitation for weight shifting;Tactile cues for weight shifting;Verbal cues for technique;Tactile cues for sequencing;Tactile cues for initiation;Verbal cues for sequencing;Manual facilitation for weight bearing   Stand to Sit Details cues for anterior trunk lean in prep  for sit to stand   Stand Pivot Transfers 2: Max Arts development officer Details (indicate cue type and reason) needs max assist for weight shifting to advance RLE and LLE - needs mod to max assist to maintain L knee extension in standing     Ambulation/Gait   Ambulation/Gait Yes   Ambulation/Gait Assistance 2: Max assist;1: +2 Total assist   Ambulation/Gait Assistance Details Pt gait trained with max assist - pt's LUE around assisting PTA with myself, primary PT supporting pr's RUE with manual fascilitation at pelvis and rib cage to promote thoracic spine and hip extension   Ambulation Distance (Feet) 30 Feet  2nd rep approx. 36' w/ husband in front-pt hold'g his [place] device 2 person hand held assist   platform RW trialed - unsuccessful    Gait Pattern Step-through pattern;Decreased dorsiflexion - right;Decreased hip/knee flexion - right;Right flexed knee in stance;Left flexed knee in stance;Right foot flat;Left foot flat;Scissoring;Lateral trunk lean to left;Decreased weight shift to right;Narrow base of support;Poor foot clearance - right   Ambulation Surface Level;Indoor     Static Standing Balance   Static Standing - Balance Support Bilateral upper extremity supported   Static Standing - Level of Assistance 4: Min assist;5: Stand by assistance     NeuroRe-ed:  Static and dynamic sitting balance with CGA to min assist unsupported on mat Worked on trunk rotation to Rt and Lt sides with min to mod assist for increased trunk rotation Sit to leaning on Rt forearm for side sitting - min assist to achieve this position and then to return to upright;  Sit to leaning on Lt forearm for side sitting - CGA to min assist given to stabilize and then to return to upright sitting  Pt peformed scooting forward and backward on mat with mod to max assist - fascilitation to unweight one hip with  mod assist to scoot hip forward after weight shift             PT Short Term Goals - 03/01/16 1611      PT SHORT TERM GOAL #1   Title Pt will consistently perform supine rolling in B directions with min A (with rails as needed) in order to indicate increased trunk dissociation, increased independence with bed mobility.  (Target date for all STGs: 03/29/16)   Time 4   Period Weeks   Status New     PT SHORT TERM GOAL #2   Title Pt will consistently perform supine <> sit with min A to indicate increased independence getting out of bed.    Time 4   Period Weeks   Status New     PT SHORT TERM GOAL #3   Title Pt will demonstrate midline posture in static sitting with BLE support and RUE support at a S level to indicate increased postural awareness/control.     Time 4   Period Weeks   Status New      PT SHORT TERM GOAL #4   Title Pt will consistently perform functional transfers from w/c <> mat table with mod A to decrease caregiver burden.     Time 4   Period Weeks   Status New     PT SHORT TERM GOAL #5   Title Pt will consistently perform sit <> stand from personal w/c with BUE support and mod A to decrease caregiver burden.     Time 4   Period Weeks   Status New  PT SHORT TERM GOAL #6   Title Pt will perform static standing with BUE support x1 minute with supervision while in stedy, no overt LOB to decrease caregiver burden.    Time 4   Period Weeks   Status New           PT Long Term Goals - 03/01/16 1641      PT LONG TERM GOAL #1   Title Pt will perform supine <> sit with supervision 75% of the time to indicate increased independence getting out of bed.   (Target for all LTGs: 04/26/16)   Time 8   Period Weeks   Status New     PT LONG TERM GOAL #2   Title Pt will demonstrate midline posture in static sitting with BLE support without UE support to indicate increased postural awareness/control.    Time 8   Period Weeks   Status New     PT LONG TERM GOAL #3   Title Pt will consistently perform functional transfers at min A level 75% of the time to decrease caregiver burden.     Time 8   Period Weeks   Status New     PT LONG TERM GOAL #4   Title Pt will perform sit <> stand from personal w/c with BUE support and min A, 75% of time to decrease caregiver burden.     Time 8   Period Weeks   Status New     PT LONG TERM GOAL #5   Title Pt will demonstrate ability to ambulate up to 5' w/ LRAD with max A in order to better assist with getting to/from restroom to decrease burden of care.    Time 8   Period Weeks   Status New               Plan - 03/05/16 1455    Clinical Impression Statement Platform RW was trialed for assistance with gait training but was not able to be used successfully as pt had difficulty standing upright -needs mod to max assist  fully support trunk for stabilization to be able to shift weight to advance lower extremities during swing phase of gait;  RLE externally rotated in stance, with pt requiring max assist to place in neurtral position.  Pt did amb. approx. 25' on 2nd rep with +2 mod assist from therapists with pt's husband facing pt in front and pt holding his left hand with her Rt hand.. This distance is furthest pt has amb. in therapy at this facility.  Pt needs max assist to maintain Rt knee extension in stance   Rehab Potential Fair   Clinical Impairments Affecting Rehab Potential Chronicity of condition, continued medical management, but has very strong family and financial support   PT Frequency 2x / week   PT Duration 4 weeks   PT Treatment/Interventions ADLs/Self Care Home Management;DME Instruction;Gait training;Stair training;Functional mobility training;Therapeutic activities;Patient/family education;Cognitive remediation;Neuromuscular re-education;Therapeutic exercise;Balance training;Orthotic Fit/Training;Wheelchair mobility training;Manual techniques;Splinting;Visual/perceptual remediation/compensation;Vestibular;Passive range of motion;Electrical Stimulation   PT Next Visit Plan bed mobility (see if she can tolerate 2-3 pillows instead of wedge), sitting balance, forward weight shift, transfers-standing transfers vs slideboard for increased independence, if you have help-gait with B PFRW, tall kneeling, sitting on physioball   Consulted and Agree with Plan of Care Patient;Family member/caregiver   Family Member Consulted caregiver, [name] and husband, [name]      Patient will benefit from skilled therapeutic intervention in order to improve the following deficits and impairments:  Abnormal gait,  Decreased balance, Decreased endurance, Decreased cognition, Cardiopulmonary status limiting activity, Decreased activity tolerance, Decreased coordination, Decreased strength, Impaired flexibility, Impaired tone,  Decreased mobility, Increased muscle spasms, Impaired vision/preception, Decreased range of motion, Impaired UE functional use, Postural dysfunction, Improper body mechanics, Impaired perceived functional ability, Decreased knowledge of precautions, Decreased knowledge of use of DME  Visit Diagnosis: Other abnormalities of gait and mobility  Other symptoms and signs involving the nervous system  Muscle weakness (generalized)     Problem List Patient Active Problem List   Diagnosis Date Noted  . Acute on chronic respiratory failure with hypoxia ([facility])   . Acute hypoxemic respiratory failure ([facility])   . Acute blood loss anemia   . Atelectasis 02/04/2016  . Debility 02/03/2016  . Pneumonia of both lower lobes due to Pseudomonas species ([facility])   . Pneumonia of both lower lobes due to methicillin susceptible Staphylococcus aureus (MSSA) ([facility])   . Acute respiratory failure with hypoxia ([facility])   . Acute encephalopathy   . Seizures ([place])   . Sepsis ([place])   . Hypoxia   . Pain   . HCAP (healthcare-associated pneumonia) 12/28/2015  . Chronic respiratory failure ([place]) 12/28/2015  . Acute tracheobronchitis 12/24/2015  . Tracheostomy tube present ([place])   . Tracheal stenosis   . Chronic respiratory failure with hypoxia ([place])   . 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[place]) 10/20/2015  . Tracheostomy status ([place]) 09/26/2015  . Allergic rhinitis 09/26/2015  . 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 Movement disorder 09/22/2015  . Encephalitis 09/22/2015  . Chest pain 02/07/2014  . Premature atrial contractions 01/13/2014  . Abnormality of gait 12/04/2013  . Hyperlipidemia 12/21/2011  . Cardiovascular risk factor 12/21/2011  . Bunion 01/31/2008  . Metatarsalgia of both feet 09/20/2007  . FLAT FOOT 09/20/2007  . HALLUX RIGIDUS, ACQUIRED 09/20/2007    Alda Lea, PT 03/05/2016, 3:06 PM  [facility] [address] [place], [place],  [address] Phone: [phone]   Fax:  [phone]  Name: Judy Spears MRN: [id number] Date of Birth: 01/18/1951

## 2016-03-08 ENCOUNTER — Ambulatory Visit: Payer: BC Managed Care – PPO | Admitting: Physical Therapy

## 2016-03-08 ENCOUNTER — Encounter: Payer: Self-pay | Admitting: Occupational Therapy

## 2016-03-08 ENCOUNTER — Ambulatory Visit: Payer: BC Managed Care – PPO | Admitting: Speech Pathology

## 2016-03-08 ENCOUNTER — Ambulatory Visit: Payer: BC Managed Care – PPO | Admitting: Occupational Therapy

## 2016-03-08 DIAGNOSIS — R293 Abnormal posture: Secondary | ICD-10-CM

## 2016-03-08 DIAGNOSIS — R278 Other lack of coordination: Secondary | ICD-10-CM

## 2016-03-08 DIAGNOSIS — M6281 Muscle weakness (generalized): Secondary | ICD-10-CM | POA: Diagnosis not present

## 2016-03-08 DIAGNOSIS — M25622 Stiffness of left elbow, not elsewhere classified: Secondary | ICD-10-CM

## 2016-03-08 DIAGNOSIS — R2681 Unsteadiness on feet: Secondary | ICD-10-CM

## 2016-03-08 DIAGNOSIS — R2689 Other abnormalities of gait and mobility: Secondary | ICD-10-CM

## 2016-03-08 DIAGNOSIS — R471 Dysarthria and anarthria: Secondary | ICD-10-CM

## 2016-03-08 DIAGNOSIS — R29818 Other symptoms and signs involving the nervous system: Secondary | ICD-10-CM

## 2016-03-08 DIAGNOSIS — R41844 Frontal lobe and executive function deficit: Secondary | ICD-10-CM

## 2016-03-08 DIAGNOSIS — R41841 Cognitive communication deficit: Secondary | ICD-10-CM

## 2016-03-08 DIAGNOSIS — M25611 Stiffness of right shoulder, not elsewhere classified: Secondary | ICD-10-CM

## 2016-03-08 DIAGNOSIS — R1312 Dysphagia, oropharyngeal phase: Secondary | ICD-10-CM

## 2016-03-08 DIAGNOSIS — M25612 Stiffness of left shoulder, not elsewhere classified: Secondary | ICD-10-CM

## 2016-03-08 NOTE — Therapy (Signed)
[facility] [address] [place], [place], [address] Phone: [phone]   Fax:  [phone]  Occupational Therapy Treatment  Patient Details  Name: Judy Spears MRN: [id number] Date of Birth: 1950-09-29 Referring Provider: Dr. Quita Skye. [name]  Encounter Date: 03/08/2016      OT End of Session - 03/08/16 1725    Visit Number 3   Number of Visits 16   Date for OT Re-Evaluation 04/26/16   Authorization Type BC[employer] unlimited visits   OT Start Time [id number]   OT Stop Time 1615   OT Time Calculation (min) 43 min   Activity Tolerance Patient tolerated treatment well   Behavior During Therapy [facility] for tasks assessed/performed      Past Medical History:  Diagnosis Date  . Arthritis    lt great toe  . Complication of anesthesia    pt has had headaches post op-did advise to hydra well preop  . Hair loss    right-sided  . History of exercise stress test    a. ETT (12/15) with 12:00 exercise, no ST changes, occasional PACs.   . Hyperlipidemia   . Insomnia   . Migraine headache   . 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 Postmenopausal   . Premature atrial contractions    a. Holter (12/15) with 8% PACs, no atrial runs or atrial fibrillation.     Past Surgical History:  Procedure Laterality Date  . BREAST BIOPSY  01/01/2011   Procedure: BREAST BIOPSY WITH NEEDLE LOCALIZATION;  Surgeon: Rolm Bookbinder, MD;  Location: [facility];  Service: General;  Laterality: Left;  left breast wire localization biopsy  . cataracts right eye    . CESAREAN SECTION  1986  . HERNIA REPAIR  2000   RIH  . opirectinal membrane peel    . RHINOPLASTY  1983    There were no vitals filed for this visit.      Subjective Assessment - 03/08/16 1721    Subjective  Good   Patient is accompained by: Family member   Pertinent History see epic snapshot - ABI/encephalitis/hypoxia from virus; recent hospitalization due to  ARF;    Patient Stated Goals Pt unable to state.    Currently in Pain? No/denies   Pain Score 0-No pain                      OT Treatments/Exercises (OP) - 03/08/16 0001      ADLs   Functional Mobility Worked on increased independence and decreased reliance on upper extremities for sit to stand transition.       Neurological Re-education Exercises   Other Exercises 1 Neuro reeducation to address posturakl control for sit to stand and static to dynamic stand balance.  Patient able to accept weigh on left lower extremity with assistance to maintain knee alignment.  Patient able to control weight shift to right and left in standing today with mod assist.                  OT Education - 03/08/16 1724    Education provided Yes   Education Details Importance of forward weight shift in midline leading to sit to stand, stand to sit transitions   Person(s) Educated Patient;Spouse;Caregiver(s)   Methods Explanation;Demonstration;Tactile cues;Verbal cues   Comprehension Verbal cues required;Tactile cues required;Need further instruction          OT Short Term Goals - 03/04/16 1635  OT SHORT TERM GOAL #1   Title Pt, husband and cargiver will be mod I with HEP - 03/29/2016   Status On-going     OT SHORT TERM GOAL #2   Title Pt will be able to sit with supervision without UE support and vc's to assist with midline 75% while engage in functional task   Status On-going     OT [place] #3   Title Pt will be able to complete sit to stand consistently with mod A for toilet transfers (max a for pivot)   Status On-going     OT SHORT TERM GOAL #4   Title Pt, husband and caregiver will verbalize understanding of various bathroom accomodations to reduce long term caregiver burden   Status On-going           OT Long Term Goals - 03/04/16 1635      OT LONG TERM GOAL #1   Title Pt, husband and caregiver will be mod I with upgraded HEP prn - 05/26/2016   Status  On-going     OT LONG TERM GOAL #2   Title Pt, husband and caregiver will be mod I with splint wear and care    Status On-going     OT LONG TERM GOAL #3   Title Pt will be able to wash face with min a only   Status On-going     OT LONG TERM GOAL #4   Title Pt will be able to sit without UE support with supevision 100% of the time with no more than 2 vc's while engaged in functional activity.   Status On-going     OT LONG TERM GOAL #5   Title Pt, husband and caregiver will verbalize understanding of potential after care therapeutic opportunities.    Status On-going               Plan - 03/08/16 1725    Clinical Impression Statement Patient progressing toward OT goals, showing improved activation in LE's and improved postural control in sitting.    Rehab Potential Fair   Clinical Impairments Affecting Rehab Potential severity of deficits, very slow rate of recovery   OT Frequency 2x / week   OT Duration 8 weeks   OT Treatment/Interventions Self-care/ADL training;Ultrasound;Moist Heat;Electrical Stimulation;DME and/or AE instruction;Neuromuscular education;Therapeutic exercise;Functional Mobility Training;Manual Therapy;Passive range of motion;Splinting;Therapeutic activities;Balance training;Patient/family education;Visual/perceptual remediation/compensation;Cognitive remediation/compensation   Plan fabricate right resting hand splint, sittingbalance,  sit ti stand transition   Consulted and Agree with Plan of Care Patient;Family member/caregiver   Family Member Consulted caregiver [name], husband [name]       Patient will benefit from skilled therapeutic intervention in order to improve the following deficits and impairments:  Decreased endurance, Decreased skin integrity, Impaired vision/preception, Improper body mechanics, Impaired perceived functional ability, Decreased knowledge of precautions, Cardiopulmonary status limiting activity, Decreased activity tolerance, Decreased  knowledge of use of DME, Decreased strength, Impaired flexibility, Improper spinal/pelvic alignment, Impaired sensation, Difficulty walking, Decreased mobility, Decreased balance, Decreased cognition, Decreased range of motion, Impaired tone, Impaired UE functional use, Decreased safety awareness, Decreased coordination, Pain  Visit Diagnosis: Muscle weakness (generalized)  Unsteadiness on feet  Abnormal posture  Other symptoms and signs involving the nervous system  Stiffness of left shoulder, not elsewhere classified  Stiffness of right shoulder, not elsewhere classified  Stiffness of left elbow, not elsewhere classified  Other lack of coordination  Frontal lobe and executive function deficit    Problem List Patient Active Problem List   Diagnosis  Date Noted  . Acute on chronic respiratory failure with hypoxia ([place])   . Acute hypoxemic respiratory failure ([place])   . Acute blood loss anemia   . Atelectasis 02/04/2016  . Debility 02/03/2016  . Pneumonia of both lower lobes due to Pseudomonas species ([place])   . Pneumonia of both lower lobes due to methicillin susceptible Staphylococcus aureus (MSSA) ([place])   . Acute respiratory failure with hypoxia ([place])   . Acute encephalopathy   . Seizures ([place])   . Sepsis ([place])   . Hypoxia   . Pain   . HCAP (healthcare-associated pneumonia) 12/28/2015  . Chronic respiratory failure ([place]) 12/28/2015  . Acute tracheobronchitis 12/24/2015  . Tracheostomy tube present ([place])   . Tracheal stenosis   . Chronic respiratory failure with hypoxia ([place])   . 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[place]) 10/20/2015  . Tracheostomy status ([place]) 09/26/2015  . Allergic rhinitis 09/26/2015  . 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 Movement disorder 09/22/2015  . Encephalitis 09/22/2015  . Chest pain 02/07/2014  . Premature atrial contractions 01/13/2014  . Abnormality of gait 12/04/2013  . Hyperlipidemia 12/21/2011  . Cardiovascular risk factor 12/21/2011  . Bunion  01/31/2008  . Metatarsalgia of both feet 09/20/2007  . FLAT FOOT 09/20/2007  . HALLUX RIGIDUS, ACQUIRED 09/20/2007    Mariah Milling 03/08/2016, 5:33 PM  [facility] [address] [place], [place], [address] Phone: [phone]   Fax:  [phone]  Name: Judy Spears MRN: [id number] Date of Birth: 12-13-1950

## 2016-03-08 NOTE — Therapy (Signed)
[facility] [address] [place], [place], [address] Phone: [phone]   Fax:  [phone]  Physical Therapy Treatment  Patient Details  Name: Judy Spears MRN: [id number] Date of Birth: 1950/03/17 Referring Provider: Alger Simons, MD/Andrew Letta Pate, MD  Encounter Date: 03/08/2016      PT End of Session - 03/08/16 2059    Visit Number 3   Number of Visits 20   Date for PT Re-Evaluation 04/30/16   Authorization Type BCBS   PT Start Time [id number]   PT Stop Time 1447   PT Time Calculation (min) 42 min      Past Medical History:  Diagnosis Date  . Arthritis    lt great toe  . Complication of anesthesia    pt has had headaches post op-did advise to hydra well preop  . Hair loss    right-sided  . History of exercise stress test    a. ETT (12/15) with 12:00 exercise, no ST changes, occasional PACs.   . Hyperlipidemia   . Insomnia   . Migraine headache   . 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 Postmenopausal   . Premature atrial contractions    a. Holter (12/15) with 8% PACs, no atrial runs or atrial fibrillation.     Past Surgical History:  Procedure Laterality Date  . BREAST BIOPSY  01/01/2011   Procedure: BREAST BIOPSY WITH NEEDLE LOCALIZATION;  Surgeon: Rolm Bookbinder, MD;  Location: [facility];  Service: General;  Laterality: Left;  left breast wire localization biopsy  . cataracts right eye    . CESAREAN SECTION  1986  . HERNIA REPAIR  2000   RIH  . opirectinal membrane peel    . RHINOPLASTY  1983    There were no vitals filed for this visit.      Subjective Assessment - 03/08/16 2046    Subjective Caregiver, [name], reports that they have been working on having [name] propel wheelchair in her home using her legs   Patient is accompained by: Family member  Husband, [name] and caregiver, [name]   Pertinent History Precautions:  PEG.  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  Another round of botox in LLE on 02/06/16   Limitations House hold activities;Walking;Standing;Sitting   Patient Stated Goals Per husband, "For [name] to be as independent as possible."   Currently in Pain? No/denies                         [facility] Adult PT Treatment/Exercise - 03/08/16 0432      Bed Mobility   Bed Mobility Rolling Right;Rolling Left;Sit to Supine;Left Sidelying to Sit   Rolling Right 3: Mod assist   Rolling Right Details (indicate cue type and reason) cues to abduct RUE and to flex bil. LE's ; cues to push with LLE for supine to R sidelying     Transfers   Transfers Sit to Stand   Sit to Stand 2: Max assist   Sit to Stand Details Tactile cues for sequencing;Verbal cues for technique;Tactile cues for posture;Tactile cues for weight shifting   Sit to Stand Details (indicate cue type and reason) max assist for weight shift and to turn LLE   Stand to Sit 3: Mod assist;2: Max assist  max assist  needed after amb. - due to fatigue   Stand to Sit Details (indicate cue type and reason) Manual facilitation for weight shifting;Tactile cues for weight shifting;Verbal cues for technique;Tactile cues for sequencing;Tactile cues for initiation;Verbal cues for sequencing;Manual facilitation for weight bearing   Stand Pivot Transfers 2: Max assist     Ambulation/Gait   Ambulation/Gait Yes   Ambulation/Gait Assistance 1: +2 Total assist   Ambulation/Gait Assistance Details +2 max/total assist with weight shifting pt onto LLE, both laterally and anteriorly to assist in swing through of RLE;  LLE externally rotated with bil. knees flexed; decreased step length of RLE   Ambulation Distance (Feet) 6 Feet  15, 10   Assistive device 2 person hand held assist  platform RW trialed - unsuccessful     Gait Pattern Decreased dorsiflexion - right;Decreased hip/knee flexion - right;Right flexed knee in stance;Left flexed knee in stance;Right foot flat;Left foot flat;Scissoring;Lateral trunk lean to left;Decreased weight shift to right;Narrow base of support;Poor foot clearance - right;Decreased trunk rotation   Ambulation Surface Level;Indoor     Neuro Re-ed:  Tall kneeling - initially with Susa Simmonds - pt transferred sit to supine to Rt sidelying (see details above ) and then from Rt  sidelying to tall kneeling with max assist Antony Salmon, OT co-treating); Bench was moved with pt placing her UE's on OT's shoulders With cues for upright position and to shift weight to midline -  fascilitation cues for hip extension in tall kneeling Sitting back toward heels - cues to statically hold - return to upright x 5 reps Trunk rotation to Rt and Lt sides - 3 reps each side with max to mod assist             PT Short Term Goals - 03/01/16 1611      PT SHORT TERM GOAL #1   Title Pt will consistently perform supine rolling in B directions with min A (with rails as needed) in order to indicate increased trunk dissociation, increased independence with bed mobility.  (Target date for all STGs: 03/29/16)   Time 4   Period Weeks   Status New     PT SHORT TERM GOAL #2   Title Pt will consistently perform supine <> sit with min A to indicate increased independence getting out of bed.    Time 4   Period Weeks   Status New     PT SHORT TERM GOAL #3   Title Pt will demonstrate midline posture in static sitting with BLE support and RUE support at a S level to indicate increased postural awareness/control.     Time 4   Period Weeks   Status New     PT SHORT TERM GOAL #4   Title Pt will consistently perform functional transfers from w/c <> mat table with mod A to decrease caregiver burden.     Time 4   Period Weeks   Status New     PT SHORT TERM GOAL #5   Title Pt will consistently perform sit  <> stand from personal w/c with BUE support and mod A to decrease caregiver burden.     Time 4   Period Weeks   Status New     PT SHORT TERM GOAL #6   Title Pt will perform static standing with BUE support x1 minute with supervision while in stedy, no overt LOB to decrease caregiver burden.    Time 4   Period Weeks   Status New  PT Long Term Goals - 03/01/16 1641      PT LONG TERM GOAL #1   Title Pt will perform supine <> sit with supervision 75% of the time to indicate increased independence getting out of bed.   (Target for all LTGs: 04/26/16)   Time 8   Period Weeks   Status New     PT LONG TERM GOAL #2   Title Pt will demonstrate midline posture in static sitting with BLE support without UE support to indicate increased postural awareness/control.    Time 8   Period Weeks   Status New     PT LONG TERM GOAL #3   Title Pt will consistently perform functional transfers at min A level 75% of the time to decrease caregiver burden.     Time 8   Period Weeks   Status New     PT LONG TERM GOAL #4   Title Pt will perform sit <> stand from personal w/c with BUE support and min A, 75% of time to decrease caregiver burden.     Time 8   Period Weeks   Status New     PT LONG TERM GOAL #5   Title Pt will demonstrate ability to ambulate up to 5' w/ LRAD with max A in order to better assist with getting to/from restroom to decrease burden of care.    Time 8   Period Weeks   Status New               Plan - 03/08/16 2059    Clinical Impression Statement Pt had much more difficulty ambulating today than in previous session on 03-04-16; more difficulty advancing each LE in swing phase of gait. Pt tolerated tall kneeling well with fascilitation for upright trunk and to relax shoulders.  Tactile cues to fascilitate /increase hip extension in tall kneeling position.   Rehab Potential Fair   Clinical Impairments Affecting Rehab Potential Chronicity of condition, continued  medical management, but has very strong family and financial support   PT Frequency 2x / week   PT Duration 4 weeks   PT Treatment/Interventions ADLs/Self Care Home Management;DME Instruction;Gait training;Stair training;Functional mobility training;Therapeutic activities;Patient/family education;Cognitive remediation;Neuromuscular re-education;Therapeutic exercise;Balance training;Orthotic Fit/Training;Wheelchair mobility training;Manual techniques;Splinting;Visual/perceptual remediation/compensation;Vestibular;Passive range of motion;Electrical Stimulation   PT Next Visit Plan Sitting balance/anterior weight shift: transfer training (slideboard vs. standing); standing balance ; gait training   Consulted and Agree with Plan of Care Patient;Family member/caregiver   Family Member Consulted caregiver, [name] and husband, [name]      Patient will benefit from skilled therapeutic intervention in order to improve the following deficits and impairments:  Abnormal gait, Decreased balance, Decreased endurance, Decreased cognition, Cardiopulmonary status limiting activity, Decreased activity tolerance, Decreased coordination, Decreased strength, Impaired flexibility, Impaired tone, Decreased mobility, Increased muscle spasms, Impaired vision/preception, Decreased range of motion, Impaired UE functional use, Postural dysfunction, Improper body mechanics, Impaired perceived functional ability, Decreased knowledge of precautions, Decreased knowledge of use of DME  Visit Diagnosis: Muscle weakness (generalized)  Other abnormalities of gait and mobility     Problem List Patient Active Problem List   Diagnosis Date Noted  . Acute on chronic respiratory failure with hypoxia ([place])   . Acute hypoxemic respiratory failure ([place])   . Acute blood loss anemia   . Atelectasis 02/04/2016  . Debility 02/03/2016  . Pneumonia of both lower lobes due to Pseudomonas species ([place])   . Pneumonia of both lower lobes due to  methicillin susceptible Staphylococcus aureus (MSSA) ([place])   .  Acute respiratory failure with hypoxia ([place])   . Acute encephalopathy   . Seizures ([place])   . Sepsis ([place])   . Hypoxia   . Pain   . HCAP (healthcare-associated pneumonia) 12/28/2015  . Chronic respiratory failure ([place]) 12/28/2015  . Acute tracheobronchitis 12/24/2015  . Tracheostomy tube present ([place])   . Tracheal stenosis   . Chronic respiratory failure with hypoxia ([place])   . 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[place]) 10/20/2015  . Tracheostomy status ([place]) 09/26/2015  . Allergic rhinitis 09/26/2015  . 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 Movement disorder 09/22/2015  . Encephalitis 09/22/2015  . Chest pain 02/07/2014  . Premature atrial contractions 01/13/2014  . Abnormality of gait 12/04/2013  . Hyperlipidemia 12/21/2011  . Cardiovascular risk factor 12/21/2011  . Bunion 01/31/2008  . Metatarsalgia of both feet 09/20/2007  . FLAT FOOT 09/20/2007  . HALLUX RIGIDUS, ACQUIRED 09/20/2007    Alda Lea, PT 03/08/2016, 9:10 PM  [facility] [address] [place], [place], [address] Phone: [phone]   Fax:  [phone]  Name: Judy Spears MRN: [id number] Date of Birth: [birth year]

## 2016-03-08 NOTE — Therapy (Signed)
[facility] [address] [place], [place], [address] Phone: [phone]   Fax:  [phone]  Speech Language Pathology Treatment  Patient Details  Name: Judy Spears MRN: [id number] Date of Birth: July 03, 1950 Referring Provider: Charlett Blake, MD  Encounter Date: 03/08/2016      End of Session - 03/08/16 1916    Visit Number 3   Number of Visits 17   Date for SLP Re-Evaluation 04/30/16   SLP Start Time [id number]   SLP Stop Time  [id number]   SLP Time Calculation (min) 44 min   Activity Tolerance Patient tolerated treatment well      Past Medical History:  Diagnosis Date  . Arthritis    lt great toe  . Complication of anesthesia    pt has had headaches post op-did advise to hydra well preop  . Hair loss    right-sided  . History of exercise stress test    a. ETT (12/15) with 12:00 exercise, no ST changes, occasional PACs.   . Hyperlipidemia   . Insomnia   . Migraine headache   . 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 Postmenopausal   . Premature atrial contractions    a. Holter (12/15) with 8% PACs, no atrial runs or atrial fibrillation.     Past Surgical History:  Procedure Laterality Date  . BREAST BIOPSY  01/01/2011   Procedure: BREAST BIOPSY WITH NEEDLE LOCALIZATION;  Surgeon: Rolm Bookbinder, MD;  Location: [facility];  Service: General;  Laterality: Left;  left breast wire localization biopsy  . cataracts right eye    . CESAREAN SECTION  1986  . HERNIA REPAIR  2000   RIH  . opirectinal membrane peel    . RHINOPLASTY  1983    There were no vitals filed for this visit.      Subjective Assessment - 03/08/16 1850    Subjective "I want to go to the beach now."   Patient is accompained by: Family member   Special Tests spouse, [name]; caregiver [name]   Currently in Pain? No/denies               ADULT SLP TREATMENT - 03/08/16 0001      General Information   Behavior/Cognition Cooperative;Pleasant mood;Distractible;Decreased sustained attention;Alert;Confused   Oral care provided Yes     Treatment Provided   Treatment provided Dysphagia;Cognitive-Linquistic     Dysphagia Treatment   Oral Cavity - Dentition Adequate natural dentition   Treatment Methods Skilled observation;Upgraded PO texture trial;Therapeutic exercise;Patient/caregiver education   Patient observed directly with PO's Yes   Type of PO's observed Ice chips   Feeding Needs assist;Total assist   Liquids provided via Teaspoon   Oral Phase Signs & Symptoms Anterior loss/spillage   Type of cueing Verbal;Tactile;Visual   Amount of cueing Moderate     Pain Assessment   Pain Assessment No/denies pain     Cognitive-Linquistic Treatment   Treatment focused on Voice;Dysarthria;Patient/family/caregiver education;Other (comment)   Skilled Treatment SLP performed oral care prior to trials of ice chips and water via teaspoon. Patient swallowed 2 ice chips with ~3 second delay. When asked if she would like more ice, patient stated "no more," and expressed additional comments inaudibly. Provided max verbal cues for slow rate, increased loudness to facilitate message, and attempted use of multimodal tools to clarify, which were ineffective. Family/caregiver provided context: patient expresses desire to eat something aside from ice, water. States patient proclaimed, "I want  soup," during previous week. Provided education regarding aspiration risks, as well as purpose of therapy to improve swallowing function for safe PO intake. Patient accepted teaspoon trials of water with notable improvement in timing. She completed 10 teaspoon swallows, 7/10 with appearance of timely swallow. In 3/10 trials patient with anterior oral spillage, reduced coordination which may have contributed to delay of ~3 seconds. Patient completes a prompt dry, effortful swallow after each bolus trial with verbal cue. Husband [name]  states pt has not had MBS in several months. Patient made several audible comments during session, including "Do you have a dog?" to this clinician which initiated a brief exchange, and "I want to go to the beach now." When asked which beach she would like to visit, patient's response was not audible. Utilized white board and provided verbal cues for patient to spell her answer [facility]) by tracing and verbalizing letters. Patient communicated "Bal- He--," by tracing letters with moderate cues, however her attention began fluctuating and she required max assistance and tactile cues to complete remaining letters.      Tracheostomy [place] 4 mm Uncuffed   Tracheostomy Properties Placement Date: 02/24/16 Placement Time: [place] By: ICU physician Brand: [place] Size (mm): 4 mm Style: Uncuffed     Assessment / Recommendations / Plan   Plan Continue with current plan of care     Dysphagia Recommendations   Diet recommendations NPO     General Recommendations   Oral Care Recommendations Oral care prior to ice chips;Staff/trained caregiver to provide oral care;Oral care Q4 per protocol     Progression Toward Goals   Progression toward goals Progressing toward goals          SLP Education - 03/08/16 1914    Education provided Yes   Education Details Breath support for voicing, slow rate of speech, complete additional swallows between bolus trials   Person(s) Educated Patient;Spouse;Caregiver(s)   Methods Explanation;Demonstration;Tactile cues;Verbal cues   Comprehension Verbal cues required;Tactile cues required;Need further instruction          SLP Short Term Goals - 03/08/16 1919      SLP SHORT TERM GOAL #1   Title Patient will utilize multimodal communication/AAC to spell word level responses in 80% of opportunites with max A in 2 therapy sessions.   Time 3   Period Weeks   Status On-going     SLP SHORT TERM GOAL #2   Title Patient will utilize intelligibility strategies,  breath support with mod A for phrase repetition in 75% of trials.   Baseline 2.12   Time 3   Period Weeks   Status On-going     SLP SHORT TERM GOAL #3   Title Pt will swallow ice chip/H20 via spoon in less than 5 seconds average with usual min to mod A 4/5 trials   Baseline 2.12.18   Time 3   Period Weeks   Status On-going          SLP Long Term Goals - 03/08/16 1921      SLP LONG TERM GOAL #1   Title Patient will utilize multimodal communication/AAC to clarify wants, needs, thoughts or ideas during communication breakdowns with max A   Time 7   Period Weeks   Status On-going     SLP LONG TERM GOAL #2   Title Patient will utilize intelligibility strategies, breath support with min A for 5 word functional phrases with 80% accuracy.   Time 7   Period Weeks   Status On-going  SLP LONG TERM GOAL #3   Title Patient will demonstrate readiness for MBS to determine safety for pleasure feeds by timely initiation of swallow in 3/5 trials with ice chips/H20.   Baseline 2.12.18   Time 3   Period Weeks   Status On-going          Plan - 03/08/16 1916    Clinical Impression Statement Patient with sustained alertness and attention to session tasks with rare cues for the first 41 minutes of session.She is demonstrating improvements in swallow timing, vocal intensity and attention to PO trials. Should she sustain this level of improvement during next session, recommend progressing to MBS to identify safety for pleasure feeds of additional textures and aid in determining compensatory strategies, treatment planning based on current swallow physiology. Fluctuating attention at session's end limited participation in AAC training. Recommend skilled ST in order to maximize functional communication, swallowing safety, reduce caregiver burden and improve quality of life.   Speech Therapy Frequency 2x / week   Duration Other (comment)   Treatment/Interventions Aspiration precaution  training;Trials of upgraded texture/liquids;Compensatory strategies;Functional tasks;Patient/family education;Cueing hierarchy;Diet toleration management by SLP;Cognitive reorganization;Compensatory techniques;SLP instruction and feedback;Internal/external aids;Multimodal communcation approach   Potential to Achieve Goals Good   Potential Considerations Ability to learn/carryover information;Co-morbidities;Severity of impairments   SLP Home Exercise Plan PO trials of ice/H20 following oral care, continue utilizing multimodal techniques to improve relay of information, functional phrases   Consulted and Agree with Plan of Care Family member/caregiver;Patient   Family Member Consulted caregiver [name], husband [name]      Patient will benefit from skilled therapeutic intervention in order to improve the following deficits and impairments:   Dysarthria and anarthria  Cognitive communication deficit  Dysphagia, oropharyngeal phase    Problem List Patient Active Problem List   Diagnosis Date Noted  . Acute on chronic respiratory failure with hypoxia ([place])   . Acute hypoxemic respiratory failure ([place])   . Acute blood loss anemia   . Atelectasis 02/04/2016  . Debility 02/03/2016  . Pneumonia of both lower lobes due to Pseudomonas species ([place])   . Pneumonia of both lower lobes due to methicillin susceptible Staphylococcus aureus (MSSA) ([place])   . Acute respiratory failure with hypoxia ([place])   . Acute encephalopathy   . Seizures ([place])   . Sepsis ([place])   . Hypoxia   . Pain   . HCAP (healthcare-associated pneumonia) 12/28/2015  . Chronic respiratory failure ([place]) 12/28/2015  . Acute tracheobronchitis 12/24/2015  . Tracheostomy tube present ([place])   . Tracheal stenosis   . Chronic respiratory failure with hypoxia ([place])   . 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 Tracheostomy status ([place]) 09/26/2015  . Allergic rhinitis 09/26/2015  . 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 Movement disorder  09/22/2015  . Encephalitis 09/22/2015  . Chest pain 02/07/2014  . Premature atrial contractions 01/13/2014  . Abnormality of gait 12/04/2013  . Hyperlipidemia 12/21/2011  . Cardiovascular risk factor 12/21/2011  . Bunion 01/31/2008  . Metatarsalgia of both feet 09/20/2007  . FLAT FOOT 09/20/2007  . HALLUX RIGIDUS, ACQUIRED 09/20/2007   Deneise Lever, [place] CF-SLP Speech-Language Pathologist  Aliene Altes 03/08/2016, 7:23 PM  [facility] [address] [place], [place], [address] Phone: [phone]   Fax:  [phone]   Name: Judy Spears MRN: [id number] Date of Birth: 11-28-1950

## 2016-03-09 ENCOUNTER — Telehealth: Payer: Self-pay

## 2016-03-09 ENCOUNTER — Telehealth: Payer: Self-pay | Admitting: Neurology

## 2016-03-09 NOTE — Telephone Encounter (Signed)
Message sent to Dr. Sethi. 

## 2016-03-09 NOTE — Telephone Encounter (Signed)
Mr Judy Spears called this morning stating Mrs Judy Spears had a seizure last night lasting 45s to 57min, states has appointment tomorrow with Dr. [name] but is requesting [place] to call him back over this incident.

## 2016-03-09 NOTE — Telephone Encounter (Signed)
Patients husband called in reference to patient having a seizure last night lasting 45 seconds to a minute.  Per husband this is the patients first seizure he has seen with patient.  FYI

## 2016-03-09 NOTE — Telephone Encounter (Signed)
I spoke to the patient's husband Judy Spears who informed me that the patient had a brief 45 second episode of slight jerking of her extremities and staring followed by being sleepy after she had and a enema as she had not had a bowel movement for days and had been moved out of her bed by here aide. He did not notice any tongue bite, drooling or0 tonic-clonic activity. I wonder if this was a presyncopal event. The patient recently was hospitalized at [facility] and during the hospitalization was found to have abnormal EEG suggesting electrographic seizures and was transferred to [facility]. She had a prolonged stay there including EEG monitoring and final conclusion was that she was not having seizures and in fact she was gradually tapered off all seizure medications. I recommend conservative observation at the present time if patient has multiple episodes clearly suggestive of seizures may consider restarting seizure medications. The patient's husband voiced understanding.

## 2016-03-09 NOTE — Telephone Encounter (Signed)
Left vm with Mr. Steidle. Recommended follow up with neurology regarding seizures. If further seizures and/or neurological instability then she may need to be seen more acutely in the ED.

## 2016-03-10 ENCOUNTER — Encounter
Payer: BC Managed Care – PPO | Attending: Physical Medicine & Rehabilitation | Admitting: Physical Medicine & Rehabilitation

## 2016-03-10 ENCOUNTER — Encounter: Payer: Self-pay | Admitting: Physical Medicine & Rehabilitation

## 2016-03-10 VITALS — BP 117/79 | HR 74

## 2016-03-10 DIAGNOSIS — M898X9 Other specified disorders of bone, unspecified site: Secondary | ICD-10-CM | POA: Diagnosis not present

## 2016-03-10 DIAGNOSIS — A849 Tick-borne viral encephalitis, unspecified: Secondary | ICD-10-CM | POA: Diagnosis not present

## 2016-03-10 DIAGNOSIS — Z5189 Encounter for other specified aftercare: Secondary | ICD-10-CM | POA: Insufficient documentation

## 2016-03-10 DIAGNOSIS — R569 Unspecified convulsions: Secondary | ICD-10-CM | POA: Diagnosis not present

## 2016-03-10 DIAGNOSIS — G8250 Quadriplegia, unspecified: Secondary | ICD-10-CM | POA: Insufficient documentation

## 2016-03-10 NOTE — Progress Notes (Signed)
Subjective:    Patient ID: Judy Spears, female    DOB: 02-07-1950, 66 y.o.   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 She was back with [place] in January for rehab after her prolonged hospital stay. She progressed in rehab and was discharged home on 02/25/16. She is at home with personal care assistant. She is also now receiving outpt PT/OT/SLP. She is up walking now and showing improved stamina.   We had to reduce her formula rate/extend time due to ongoing vomiting. This seems to have worked along with reduction in water flushes as a whole. I've also instructed them to work towards a bowel program daily to every other day which should include softner/laxative as well as dig stim/supp/enema if needed. They seem to be having some results with this too.  Her sleep is much improved. She is only taking valium and melatonin at night for rest. I had reduced the valium quite a bit while she was with [place] on inpatient rehab.   Two nights ago, apparently she had seizure actvitiy. It was followed by a period of fatigue. Dr. Leonie Man spoke to Mr. Blackard and advised observation only for now. She has had no further issues.    Pain Inventory Average Pain 0 Pain Right Now 0 My pain is no pain  In the last 24 hours, has pain interfered with the following? General activity 0 Relation with others 0 Enjoyment of life 0 What TIME of day is your pain at its worst? no pain Sleep (in general) Good  Pain is worse with: no pain Pain improves with: no pain Relief from Meds: no pain  Mobility use a wheelchair needs help with transfers  Function disabled: date disabled . retired I need assistance with the following:  feeding, dressing, bathing, toileting, meal prep, household duties and shopping  Neuro/Psych bladder control problems bowel control problems spasms  Prior Studies Any changes since last visit?  no  Physicians involved in  your care Any changes since last visit?  no   Family History  Problem Relation Age of Onset  . Stroke Mother     paralyzed on right side/aphasia  . Hypertension Mother     had stroke after stopping BP meds  . Colon cancer Father   . Prostate cancer Father   . Heart attack Maternal Grandfather 56  . Stroke Maternal Uncle 17   Social History   Social History  . Marital status: Married    Spouse name: N/A  . Number of children: N/A  . Years of education: N/A   Social History Main Topics  . Smoking status: Never Smoker  . Smokeless tobacco: Never Used  . Alcohol use 0.0 oz/week     Comment: Occasional.  . Drug use: No  . Sexual activity: Yes    Birth control/ protection: Post-menopausal   Other Topics Concern  . None   Social History Narrative  . None   Past Surgical History:  Procedure Laterality Date  . BREAST BIOPSY  01/01/2011   Procedure: BREAST BIOPSY WITH NEEDLE LOCALIZATION;  Surgeon: Rolm Bookbinder, MD;  Location: [facility];  Service: General;  Laterality: Left;  left breast wire localization biopsy  . cataracts right eye    . CESAREAN SECTION  1986  . HERNIA REPAIR  2000   RIH  . opirectinal membrane peel    . RHINOPLASTY  1983   Past Medical History:  Diagnosis Date  . Arthritis    lt great toe  . Complication of anesthesia    pt has had headaches post op-did advise to hydra well preop  . Hair loss    right-sided  . History of exercise stress test    a. ETT (12/15) with 12:00 exercise, no ST changes, occasional PACs.   . Hyperlipidemia   . Insomnia   . Migraine headache   . 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 Postmenopausal   . Premature atrial contractions    a. Holter (12/15) with 8% PACs, no atrial runs or atrial fibrillation.    BP 117/79   Pulse 74   LMP 12/01/2010   SpO2 95%   Opioid Risk Score:   Fall Risk Score:  `1  Depression screen PHQ 2/9  No flowsheet data found.   Review of Systems    Constitutional: Negative.   HENT: Negative.   Eyes: Negative.   Respiratory: Negative.   Cardiovascular: Negative.   Gastrointestinal:       Neurogenic bowel  Endocrine: Negative.   Genitourinary:       Neurogenic bladder  Musculoskeletal:       Spasms  Skin: Negative.   Allergic/Immunologic: Negative.   Neurological: Negative.   Hematological: Negative.   Psychiatric/Behavioral: Negative.   All other systems reviewed and are negative.      Objective:   Physical Exam  Constitutional: She appears well-developed.   NAD. HENT: NCAT  Eyes: No discharge, EOM appear intact Neck: #4 trach with PMV.  voice is strong Cardiovascular: RRR Respiratory: CTA with improved air movement GI: BS+, NT, ND.  PEG site intact. Musculoskeletal: She exhibits no edema, no tenderness.  Neurological: She is alert and making good eye contact MAS:  LUE and LLE. LUE 1-2/4. LLE now 1-2/4.              -elbow with PROM at130-135--I feel that there is a hard stop of sorts at this point           -knee is fully extended today  -tr to 1/4 tone in the LE's   -no myoclonus or involuntary movements today        Motor:   3/5 RUE and RLE with increased engagement of this side           2-3/5 LUE and LLE--left upper limited by the contracture Skin: Skin is warmand dry.  did not examine buttocks Psychiatric: alert and interactive today      Assessment & Plan:  1. Functional, mobility and cognitive/communication deficitssecondary to debility in setting of prior encephalopathy/tetraplegia           -now at neuro-rehab for therapies to address upper extremity use, mobility, spasticity control  -seems to be making further functional progress. Very pleased today!  2. Pain Management: tylenol prn 3. Fluids/Electrolytes/Nutrition:  TF formula reduced to 14 hr/68cc hr           -decreased water flushes also to help  -need follow up BMET/prealbumin to make sure her nutritional needs are being met. She  looks very good today, so I suspect they are.   -also asked home RN to follow up on C diff results---I have never seen these 4. Spasticity: On baclofen qid and valium only 27m qhs  -continue left elbow splinting  -therapy  -will order an xray of the left elbow to rule out heterotopic bone -botox injection performed 02/06/16 --   5. VDRF: now with  cuffless #4 trach.  tolerating PMV---seems to be doing quite well            -continue per plan of CCS  Thirty minutes of face to face patient care time were spent during this visit. All questions were encouraged and answered. Follow up in 2 months. Greater than 50% of time during this encounter was spent counseling patient/family in regard to TF/bowel program/spasticity/etc.

## 2016-03-10 NOTE — Patient Instructions (Addendum)
CONTINUE WITH BOWEL PROGRAM: USE DIG STIM AND SUPPOSITORY AS FIRST LINE TO PRODUCE BM. USE ENEMA AS A BACK UP.   HAVE HOME NURSE FOLLOW UP WITH ME REGARDING NEW LAB WORK AND C DIFF RESULTS.

## 2016-03-11 ENCOUNTER — Ambulatory Visit
Admission: RE | Admit: 2016-03-11 | Discharge: 2016-03-11 | Disposition: A | Discharge status: Home / Self Care | Payer: BC Managed Care – PPO | Source: Ambulatory Visit | Attending: Physical Medicine & Rehabilitation | Admitting: Physical Medicine & Rehabilitation

## 2016-03-11 DIAGNOSIS — M898X9 Other specified disorders of bone, unspecified site: Secondary | ICD-10-CM

## 2016-03-12 ENCOUNTER — Encounter: Payer: Self-pay | Admitting: Rehabilitation

## 2016-03-12 ENCOUNTER — Ambulatory Visit: Payer: BC Managed Care – PPO | Admitting: Rehabilitation

## 2016-03-12 ENCOUNTER — Ambulatory Visit: Payer: BC Managed Care – PPO

## 2016-03-12 DIAGNOSIS — R293 Abnormal posture: Secondary | ICD-10-CM

## 2016-03-12 DIAGNOSIS — M6281 Muscle weakness (generalized): Secondary | ICD-10-CM

## 2016-03-12 DIAGNOSIS — R471 Dysarthria and anarthria: Secondary | ICD-10-CM

## 2016-03-12 DIAGNOSIS — R2681 Unsteadiness on feet: Secondary | ICD-10-CM

## 2016-03-12 DIAGNOSIS — R1312 Dysphagia, oropharyngeal phase: Secondary | ICD-10-CM

## 2016-03-12 DIAGNOSIS — R278 Other lack of coordination: Secondary | ICD-10-CM

## 2016-03-12 DIAGNOSIS — R41841 Cognitive communication deficit: Secondary | ICD-10-CM

## 2016-03-12 DIAGNOSIS — M25651 Stiffness of right hip, not elsewhere classified: Secondary | ICD-10-CM

## 2016-03-12 DIAGNOSIS — M25652 Stiffness of left hip, not elsewhere classified: Secondary | ICD-10-CM

## 2016-03-12 NOTE — Telephone Encounter (Signed)
Mr delair called back and doctors message was given to him.

## 2016-03-12 NOTE — Therapy (Signed)
[facility] [address] [place], [place], [address] Phone: [phone]   Fax:  [phone]  Speech Language Pathology Treatment  Patient Details  Name: Judy Spears MRN: [id number] Date of Birth: 1950-06-05 Referring Provider: Charlett Blake, MD  Encounter Date: 03/12/2016      End of Session - 03/12/16 1214    Visit Number 4   Number of Visits 17   Date for SLP Re-Evaluation 04/30/16   SLP Start Time 1106   SLP Stop Time  1146   SLP Time Calculation (min) 40 min   Activity Tolerance Patient tolerated treatment well      Past Medical History:  Diagnosis Date  . Arthritis    lt great toe  . Complication of anesthesia    pt has had headaches post op-did advise to hydra well preop  . Hair loss    right-sided  . History of exercise stress test    a. ETT (12/15) with 12:00 exercise, no ST changes, occasional PACs.   . Hyperlipidemia   . Insomnia   . Migraine headache   . 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 Postmenopausal   . Premature atrial contractions    a. Holter (12/15) with 8% PACs, no atrial runs or atrial fibrillation.     Past Surgical History:  Procedure Laterality Date  . BREAST BIOPSY  01/01/2011   Procedure: BREAST BIOPSY WITH NEEDLE LOCALIZATION;  Surgeon: Rolm Bookbinder, MD;  Location: [facility];  Service: General;  Laterality: Left;  left breast wire localization biopsy  . cataracts right eye    . CESAREAN SECTION  1986  . HERNIA REPAIR  2000   RIH  . opirectinal membrane peel    . RHINOPLASTY  1983    There were no vitals filed for this visit.      Subjective Assessment - 03/12/16 1110    Subjective (aphonic) "Hi Sonia Stickels. - - (How) are you?" Hydrophonic voice entering ST today.   Patient is accompained by: [name]               ADULT SLP TREATMENT - 03/12/16 1116      General Information   Behavior/Cognition Cooperative;Pleasant  mood;Distractible;Decreased sustained attention;Alert;Confused     Treatment Provided   Treatment provided Dysphagia;Cognitive-Linquistic     Dysphagia Treatment   Oral Cavity - Dentition Adequate natural dentition   Treatment Methods Skilled observation   Patient observed directly with PO's Yes   Type of PO's observed Thin liquids   Feeding Needs assist   Liquids provided via Teaspoon   Oral Phase Signs & Symptoms Anterior loss/spillage   Pharyngeal Phase Signs & Symptoms Suspected delayed swallow initiation   Type of cueing Verbal;Tactile;Visual   Amount of cueing Moderate     Cognitive-Linquistic Treatment   Treatment focused on Voice;Dysarthria;Patient/family/caregiver education   Skilled Treatment (dysphagia tx) SLP performed oral care prior to trials of 1/2 teapoons water, removing scant amounts yellow semi-thickened phlegm. Patient swallowed 1/2 teaspoons water 93% success, and 90% of those were within ~3 seconds of presentation. Anterior labial leakaage with 60-70% of trials. Pt with hydrophonic voice 10% of trials after initial swallow, requiring SLP cues for reswallow -pt unaware of hydrophonic voice and/or made no attempt to clear/reswallow during SLP engagement with pt. (speech tx) Patient made several spontaneous semi-audible comments during session, however intelligibility was <20% for this trained listener. Percentage of voiced syllables/words was greater than at end of  last treatment course, however intelligibility was essentially unchanged. SLP reminded pt/caregier that pt must BREATH prior to speaking to improve her functional intelligibility. SLP encouraged caregiver to ask pt questions without need for linguistic thought (personal yes/no or [place] ?s,, temporal orientation WH?s, etc) with focus on pt taking BIG BREATH prior to giving answer.     Tracheostomy Shiley 4 mm Uncuffed   Tracheostomy Properties Placement Date: 02/24/16 Placement Time: [place] By: ICU physician  Brand: Shiley Size (mm): 4 mm Style: Uncuffed     Assessment / Recommendations / Plan   Plan Continue with current plan of care     Dysphagia Recommendations   Diet recommendations NPO     General Recommendations   Oral Care Recommendations Oral care prior to ice chips;Staff/trained caregiver to provide oral care;Oral care Q4 per protocol     Progression Toward Goals   Progression toward goals Progressing toward goals          SLP Education - 03/12/16 1214    Education provided Yes   Education Details breath necessary for improved intelligiblity, oral care necessary prior to any POs at home, pt to slow down speech rate   Person(s) Educated Patient;Caregiver(s)   Methods Explanation;Demonstration;Verbal cues   Comprehension Verbalized understanding;Need further instruction          SLP Short Term Goals - 03/12/16 1218      SLP SHORT TERM GOAL #1   Title Patient will utilize multimodal communication/AAC to spell word level responses in 80% of opportunites with max A in 2 therapy sessions.   Time 3   Period Weeks   Status On-going     SLP SHORT TERM GOAL #2   Title Patient will utilize intelligibility strategies, breath support with mod A for phrase repetition in 75% of trials.   Baseline 2.12   Time 3   Period Weeks   Status On-going     SLP SHORT TERM GOAL #3   Title Pt will swallow ice chip/H20 via spoon in less than 5 seconds average with usual min to mod A 4/5 trials   Baseline 2.12.18, 03-12-16   Time 3   Period Weeks   Status On-going          SLP Long Term Goals - 03/12/16 1218      SLP LONG TERM GOAL #1   Title Patient will utilize multimodal communication/AAC to clarify wants, needs, thoughts or ideas during communication breakdowns with max A   Time 7   Period Weeks   Status On-going     SLP LONG TERM GOAL #2   Title Patient will utilize intelligibility strategies, breath support with min A for 5 word functional phrases with 80% accuracy.   Time  7   Period Weeks   Status On-going     SLP LONG TERM GOAL #3   Title Patient will demonstrate readiness for MBS to determine safety for pleasure feeds by timely initiation of swallow in 3/5 trials with ice chips/H20.   Baseline 2.12.18   Time 3   Period Weeks   Status On-going          Plan - 03/12/16 1215    Clinical Impression Statement Patient with sustained alertness and attention to session tasks with rare cues for the entire 40 minute session.  Pt's swallow trigger time was approx 3 seconds when she swallowed 1/2 teaspoons icewater. Consider repeat modified barium swallow. Recommend skilled ST in order to maximize functional communication, swallowing safety, reduce caregiver burden and  improve quality of life.   Speech Therapy Frequency 2x / week   Duration Other (comment)   Treatment/Interventions Aspiration precaution training;Trials of upgraded texture/liquids;Compensatory strategies;Functional tasks;Patient/family education;Cueing hierarchy;Diet toleration management by SLP;Cognitive reorganization;Compensatory techniques;SLP instruction and feedback;Internal/external aids;Multimodal communcation approach   Potential to Achieve Goals Good   Potential Considerations Ability to learn/carryover information;Co-morbidities;Severity of impairments   SLP Home Exercise Plan PO trials of ice/H20 following oral care, continue utilizing multimodal techniques to improve relay of information, functional phrases   Consulted and Agree with Plan of Care Family member/caregiver;Patient   Family Member Consulted caregiver [name], husband [name]      Patient will benefit from skilled therapeutic intervention in order to improve the following deficits and impairments:   Dysarthria and anarthria  Cognitive communication deficit  Dysphagia, oropharyngeal phase    Problem List Patient Active Problem List   Diagnosis Date Noted  . Heterotopic ossification of bone 03/10/2016  . Acute on chronic  respiratory failure with hypoxia ([place])   . Acute hypoxemic respiratory failure ([place])   . Acute blood loss anemia   . Atelectasis 02/04/2016  . Debility 02/03/2016  . Pneumonia of both lower lobes due to Pseudomonas species ([place])   . Pneumonia of both lower lobes due to methicillin susceptible Staphylococcus aureus (MSSA) ([place])   . Acute respiratory failure with hypoxia ([place])   . Acute encephalopathy   . Seizures ([place])   . Sepsis ([place])   . Hypoxia   . Pain   . HCAP (healthcare-associated pneumonia) 12/28/2015  . Chronic respiratory failure ([place]) 12/28/2015  . Acute tracheobronchitis 12/24/2015  . Tracheostomy tube present ([place])   . Tracheal stenosis   . Chronic respiratory failure with hypoxia ([place])   . 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[place]) 10/20/2015  . Tracheostomy status ([place]) 09/26/2015  . Allergic rhinitis 09/26/2015  . 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 Movement disorder 09/22/2015  . Encephalitis 09/22/2015  . Chest pain 02/07/2014  . Premature atrial contractions 01/13/2014  . Abnormality of gait 12/04/2013  . Hyperlipidemia 12/21/2011  . Cardiovascular risk factor 12/21/2011  . Bunion 01/31/2008  . Metatarsalgia of both feet 09/20/2007  . FLAT FOOT 09/20/2007  . HALLUX RIGIDUS, ACQUIRED 09/20/2007    [facility] ,MS, CCC-SLP  03/12/2016, 12:19 PM  [facility] [address] [place], [place], [address] Phone: [phone]   Fax:  [phone]   Name: Judy Spears MRN: [id number] Date of Birth: 1950/08/13

## 2016-03-12 NOTE — Therapy (Signed)
[facility] [address] [place], [place], [address] Phone: [phone]   Fax:  [phone]  Physical Therapy Treatment  Patient Details  Name: Judy Spears MRN: [id number] Date of Birth: [birth year] Referring Provider: Alger Simons, MD/Andrew Letta Pate, MD  Encounter Date: 03/12/2016      PT End of Session - 03/12/16 1529    Visit Number 4   Number of Visits 20   Date for PT Re-Evaluation 04/30/16   Authorization Type BCBS   PT Start Time 1232   PT Stop Time 1320   PT Time Calculation (min) 48 min   Activity Tolerance Patient tolerated treatment well   Behavior During Therapy [facility] for tasks assessed/performed      Past Medical History:  Diagnosis Date  . Arthritis    lt great toe  . Complication of anesthesia    pt has had headaches post op-did advise to hydra well preop  . Hair loss    right-sided  . History of exercise stress test    a. ETT (12/15) with 12:00 exercise, no ST changes, occasional PACs.   . Hyperlipidemia   . Insomnia   . Migraine headache   . 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 Postmenopausal   . Premature atrial contractions    a. Holter (12/15) with 8% PACs, no atrial runs or atrial fibrillation.     Past Surgical History:  Procedure Laterality Date  . BREAST BIOPSY  01/01/2011   Procedure: BREAST BIOPSY WITH NEEDLE LOCALIZATION;  Surgeon: Rolm Bookbinder, MD;  Location: [facility];  Service: General;  Laterality: Left;  left breast wire localization biopsy  . cataracts right eye    . CESAREAN SECTION  1986  . HERNIA REPAIR  2000   RIH  . opirectinal membrane peel    . RHINOPLASTY  1983    There were no vitals filed for this visit.      Subjective Assessment - 03/12/16 1527    Subjective Caregiver [name] reports that they have been working on standing at home without the [facility] [place]. Also performed stand pivot transfer and toileting task today in our  bathroom without stedy and single person assist.     Patient is accompained by: Family member   Pertinent History Precautions:  PEG.  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  Another round of botox in LLE on 02/06/16   Limitations House hold activities;Walking;Standing;Sitting   Patient Stated Goals Per husband, "For [name] to be as independent as possible."   Currently in Pain? No/denies             NMR:  Session worked on transitions from sit<>stand, standing with single UE support for improved postural control and balance, weight shifting in standing, forward and retro stepping on R and L to progress to gait, all with emphasis on increasing R trunk lean and R lateral head tilt during all mobility and improved LLE activation in standing and during gait.  Pt able to perform sit<>stand during session with as little as min A (mod A at times, esp at the end due to fatigue).  Performed stand pivot transfers with min/mod A with improved ability to maintain increased hip extension and upright posture throughout.  Ended session with gait with PT/OT without LE braces in order to better assess  if they are needed or are most appropriate.  Performed gait x 115' all at once with PT on L side to facilitate upright posture and forward lateral weight shift onto LLE.  Note improved ability to control L knee today, however the more PT increased forward weight shift onto LLE, note the need to guard L knee increased.  OT on R to ensure upright posture with improved midline alignment with head/shoulders.  Note no difference (for the worse) during gait without braces, therefore would continue without them in future.                      PT Education - 03/12/16 1528    Education provided Yes   Education Details  Problem solving bathroom at home (they need to install grab bar (inverted L shape) from wall to floor beside of toilet for increased independence and performing more standing stepping tasks from massage mat at home with [name] assisting at L knee.     Person(s) Educated Patient;Caregiver(s)   Methods Explanation;Demonstration   Comprehension Verbalized understanding;Returned demonstration;Need further instruction          PT Short Term Goals - 03/01/16 1611      PT SHORT TERM GOAL #1   Title Pt will consistently perform supine rolling in B directions with min A (with rails as needed) in order to indicate increased trunk dissociation, increased independence with bed mobility.  (Target date for all STGs: 03/29/16)   Time 4   Period Weeks   Status New     PT SHORT TERM GOAL #2   Title Pt will consistently perform supine <> sit with min A to indicate increased independence getting out of bed.    Time 4   Period Weeks   Status New     PT SHORT TERM GOAL #3   Title Pt will demonstrate midline posture in static sitting with BLE support and RUE support at a S level to indicate increased postural awareness/control.     Time 4   Period Weeks   Status New     PT SHORT TERM GOAL #4   Title Pt will consistently perform functional transfers from w/c <> mat table with mod A to decrease caregiver burden.     Time 4   Period Weeks   Status New     PT SHORT TERM GOAL #5   Title Pt will consistently perform sit <> stand from personal w/c with BUE support and mod A to decrease caregiver burden.     Time 4   Period Weeks   Status New     PT SHORT TERM GOAL #6   Title Pt will perform static standing with BUE support x1 minute with supervision while in stedy, no overt LOB to decrease caregiver burden.    Time 4   Period Weeks   Status New           PT Long Term Goals - 03/01/16 1641      PT LONG TERM GOAL #1   Title Pt will perform supine <> sit with supervision 75% of the time to  indicate increased independence getting out of bed.   (Target for all LTGs: 04/26/16)   Time 8   Period Weeks   Status New     PT LONG TERM GOAL #2   Title Pt will demonstrate midline posture in static sitting with BLE support without UE support to indicate increased postural awareness/control.    Time 8  Period Weeks   Status New     PT LONG TERM GOAL #3   Title Pt will consistently perform functional transfers at min A level 75% of the time to decrease caregiver burden.     Time 8   Period Weeks   Status New     PT LONG TERM GOAL #4   Title Pt will perform sit <> stand from personal w/c with BUE support and min A, 75% of time to decrease caregiver burden.     Time 8   Period Weeks   Status New     PT LONG TERM GOAL #5   Title Pt will demonstrate ability to ambulate up to 5' w/ LRAD with max A in order to better assist with getting to/from restroom to decrease burden of care.    Time 8   Period Weeks   Status New               Plan - 03/12/16 1529    Clinical Impression Statement Pt with marked improvement today with both standing, weight shifting, stepping/retro stepping and gait (without braces) than in previous sessions.  Encouraged increased activity at home (problem solved with [name]) without Stedy when able.     Rehab Potential Fair   Clinical Impairments Affecting Rehab Potential Chronicity of condition, continued medical management, but has very strong family and financial support   PT Frequency 2x / week   PT Duration 8 weeks  with ability to increase to 3x/wk for 4 weeks if progressing    PT Treatment/Interventions ADLs/Self Care Home Management;DME Instruction;Gait training;Stair training;Functional mobility training;Therapeutic activities;Patient/family education;Cognitive remediation;Neuromuscular re-education;Therapeutic exercise;Balance training;Orthotic Fit/Training;Wheelchair mobility training;Manual techniques;Splinting;Visual/perceptual  remediation/compensation;Vestibular;Passive range of motion;Electrical Stimulation   PT Next Visit Plan Sitting balance/anterior weight shift: transfer training-standing; standing balance ; gait training-I would do without braces, we did on 03/12/16 with great success.     Consulted and Agree with Plan of Care Patient;Family member/caregiver   Family Member Consulted caregiver, [name]       Patient will benefit from skilled therapeutic intervention in order to improve the following deficits and impairments:  Abnormal gait, Decreased balance, Decreased endurance, Decreased cognition, Cardiopulmonary status limiting activity, Decreased activity tolerance, Decreased coordination, Decreased strength, Impaired flexibility, Impaired tone, Decreased mobility, Increased muscle spasms, Impaired vision/preception, Decreased range of motion, Impaired UE functional use, Postural dysfunction, Improper body mechanics, Impaired perceived functional ability, Decreased knowledge of precautions, Decreased knowledge of use of DME  Visit Diagnosis: Muscle weakness (generalized)  Unsteadiness on feet  Abnormal posture  Stiffness of left hip, not elsewhere classified  Stiffness of right hip, not elsewhere classified  Other lack of coordination     Problem List Patient Active Problem List   Diagnosis Date Noted  . Heterotopic ossification of bone 03/10/2016  . Acute on chronic respiratory failure with hypoxia ([place])   . Acute hypoxemic respiratory failure ([place])   . Acute blood loss anemia   . Atelectasis 02/04/2016  . Debility 02/03/2016  . Pneumonia of both lower lobes due to Pseudomonas species ([place])   . Pneumonia of both lower lobes due to methicillin susceptible Staphylococcus aureus (MSSA) ([place])   . Acute respiratory failure with hypoxia ([place])   . Acute encephalopathy   . Seizures ([place])   . Sepsis ([place])   . Hypoxia   . Pain   . HCAP (healthcare-associated pneumonia) 12/28/2015  . Chronic  respiratory failure ([place]) 12/28/2015  . Acute tracheobronchitis 12/24/2015  . Tracheostomy tube present ([place])   . Tracheal  stenosis   . Chronic respiratory failure with hypoxia ([place])   . 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[place]) 10/20/2015  . Tracheostomy status ([place]) 09/26/2015  . Allergic rhinitis 09/26/2015  . 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 Movement disorder 09/22/2015  . Encephalitis 09/22/2015  . Chest pain 02/07/2014  . Premature atrial contractions 01/13/2014  . Abnormality of gait 12/04/2013  . Hyperlipidemia 12/21/2011  . Cardiovascular risk factor 12/21/2011  . Bunion 01/31/2008  . Metatarsalgia of both feet 09/20/2007  . FLAT FOOT 09/20/2007  . HALLUX RIGIDUS, ACQUIRED 09/20/2007    Cameron Sprang, PT, MPT [facility] [address] [place], [place], [address] Phone: [phone]   Fax:  [phone] 03/12/16, 3:38 PM  Name: Judy Spears MRN: [id number] Date of Birth: [birth year]

## 2016-03-12 NOTE — Telephone Encounter (Signed)
Let Mr Lichtenberg know there are no bony abnormalities on xray of elbow. thanks

## 2016-03-12 NOTE — Telephone Encounter (Signed)
Called cell phone, no answer, left message

## 2016-03-15 ENCOUNTER — Encounter: Payer: Self-pay | Admitting: Rehabilitation

## 2016-03-15 ENCOUNTER — Encounter: Payer: Self-pay | Admitting: Occupational Therapy

## 2016-03-15 ENCOUNTER — Ambulatory Visit: Payer: BC Managed Care – PPO | Admitting: Speech Pathology

## 2016-03-15 ENCOUNTER — Ambulatory Visit: Payer: BC Managed Care – PPO | Admitting: Rehabilitation

## 2016-03-15 ENCOUNTER — Ambulatory Visit: Payer: BC Managed Care – PPO | Admitting: Occupational Therapy

## 2016-03-15 DIAGNOSIS — M25652 Stiffness of left hip, not elsewhere classified: Secondary | ICD-10-CM

## 2016-03-15 DIAGNOSIS — R471 Dysarthria and anarthria: Secondary | ICD-10-CM

## 2016-03-15 DIAGNOSIS — R41841 Cognitive communication deficit: Secondary | ICD-10-CM

## 2016-03-15 DIAGNOSIS — M25622 Stiffness of left elbow, not elsewhere classified: Secondary | ICD-10-CM

## 2016-03-15 DIAGNOSIS — R29818 Other symptoms and signs involving the nervous system: Secondary | ICD-10-CM

## 2016-03-15 DIAGNOSIS — M6281 Muscle weakness (generalized): Secondary | ICD-10-CM | POA: Diagnosis not present

## 2016-03-15 DIAGNOSIS — R2681 Unsteadiness on feet: Secondary | ICD-10-CM

## 2016-03-15 DIAGNOSIS — M25651 Stiffness of right hip, not elsewhere classified: Secondary | ICD-10-CM

## 2016-03-15 DIAGNOSIS — R293 Abnormal posture: Secondary | ICD-10-CM

## 2016-03-15 DIAGNOSIS — M25612 Stiffness of left shoulder, not elsewhere classified: Secondary | ICD-10-CM

## 2016-03-15 DIAGNOSIS — R1312 Dysphagia, oropharyngeal phase: Secondary | ICD-10-CM

## 2016-03-15 DIAGNOSIS — M25611 Stiffness of right shoulder, not elsewhere classified: Secondary | ICD-10-CM

## 2016-03-15 DIAGNOSIS — R41844 Frontal lobe and executive function deficit: Secondary | ICD-10-CM

## 2016-03-15 NOTE — Therapy (Signed)
[facility] [address] [place], [place], [address] Phone: [phone]   Fax:  [phone]  Physical Therapy Treatment  Patient Details  Name: Judy Spears MRN: [id number] Date of Birth: 04-Oct-1950 Referring Provider: Alger Simons, MD/Andrew Letta Pate, MD  Encounter Date: 03/15/2016      PT End of Session - 03/15/16 1118    Visit Number 5   Number of Visits 20   Date for PT Re-Evaluation 04/30/16   Authorization Type [employer]   PT Start Time 0935   PT Stop Time 1018   PT Time Calculation (min) 43 min   Activity Tolerance Patient tolerated treatment well   Behavior During Therapy [facility] for tasks assessed/performed      Past Medical History:  Diagnosis Date  . Arthritis    lt great toe  . Complication of anesthesia    pt has had headaches post op-did advise to hydra well preop  . Hair loss    right-sided  . History of exercise stress test    a. ETT (12/15) with 12:00 exercise, no ST changes, occasional PACs.   . Hyperlipidemia   . Insomnia   . Migraine headache   . 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 Postmenopausal   . Premature atrial contractions    a. Holter (12/15) with 8% PACs, no atrial runs or atrial fibrillation.     Past Surgical History:  Procedure Laterality Date  . BREAST BIOPSY  01/01/2011   Procedure: BREAST BIOPSY WITH NEEDLE LOCALIZATION;  Surgeon: Rolm Bookbinder, MD;  Location: [facility];  Service: General;  Laterality: Left;  left breast wire localization biopsy  . cataracts right eye    . CESAREAN SECTION  1986  . HERNIA REPAIR  2000   RIH  . opirectinal membrane peel    . RHINOPLASTY  1983    There were no vitals filed for this visit.      Subjective Assessment - 03/15/16 1116    Subjective Caregiver [name] and husband [name] with pt today and report no changes.     Patient is accompained by: Family member   Pertinent History Precautions:  PEG.  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  Another round of botox in LLE on 02/06/16   Limitations House hold activities;Walking;Standing;Sitting   Patient Stated Goals Per husband, "For [name] to be as independent as possible."   Currently in Pain? No/denies            NMR:  Performed gait x 100' x 1 rep and another 20' x 1 without device and without AFOs with PT/OT in order to address postural control, improved B hip protraction and forward/lateral weight shift over LE during all phases of stance.   Note good return with facilitation initially, however with increased fatigue, required increased facilitation at trunk for upright posture and increased R head/trunk lateral flexion.  Also had pt stand against wall during session to encourage upright posture and "pushing" shoulders/back into the wall.  While against wall, worked on lateral weight shifts, increased activation of LLE, forward and retro stepping RLE to increase L stance control.  Requires up to mod A at times but as little as min A depending on posture and L knee control.  PT Education - 03/15/16 1117    Education provided Yes   Education Details importance of upright posture and increased posterior weight shift to maintain balance.    Person(s) Educated Patient;Spouse;Caregiver(s)   Methods Explanation;Demonstration   Comprehension Verbalized understanding;Returned demonstration          PT Short Term Goals - 03/01/16 1611      PT SHORT TERM GOAL #1   Title Pt will consistently perform supine rolling in B directions with min A (with rails as needed) in order to indicate increased trunk dissociation, increased independence with bed mobility.  (Target date for all STGs: 03/29/16)   Time 4   Period Weeks    Status New     PT SHORT TERM GOAL #2   Title Pt will consistently perform supine <> sit with min A to indicate increased independence getting out of bed.    Time 4   Period Weeks   Status New     PT SHORT TERM GOAL #3   Title Pt will demonstrate midline posture in static sitting with BLE support and RUE support at a S level to indicate increased postural awareness/control.     Time 4   Period Weeks   Status New     PT SHORT TERM GOAL #4   Title Pt will consistently perform functional transfers from w/c <> mat table with mod A to decrease caregiver burden.     Time 4   Period Weeks   Status New     PT SHORT TERM GOAL #5   Title Pt will consistently perform sit <> stand from personal w/c with BUE support and mod A to decrease caregiver burden.     Time 4   Period Weeks   Status New     PT SHORT TERM GOAL #6   Title Pt will perform static standing with BUE support x1 minute with supervision while in stedy, no overt LOB to decrease caregiver burden.    Time 4   Period Weeks   Status New           PT Long Term Goals - 03/01/16 1641      PT LONG TERM GOAL #1   Title Pt will perform supine <> sit with supervision 75% of the time to indicate increased independence getting out of bed.   (Target for all LTGs: 04/26/16)   Time 8   Period Weeks   Status New     PT LONG TERM GOAL #2   Title Pt will demonstrate midline posture in static sitting with BLE support without UE support to indicate increased postural awareness/control.    Time 8   Period Weeks   Status New     PT LONG TERM GOAL #3   Title Pt will consistently perform functional transfers at min A level 75% of the time to decrease caregiver burden.     Time 8   Period Weeks   Status New     PT LONG TERM GOAL #4   Title Pt will perform sit <> stand from personal w/c with BUE support and min A, 75% of time to decrease caregiver burden.     Time 8   Period Weeks   Status New     PT LONG TERM GOAL #5   Title Pt  will demonstrate ability to ambulate up to 5' w/ LRAD with max A in order to better assist with getting to/from restroom to decrease burden of care.    Time 8  Period Weeks   Status New               Plan - 03/15/16 1118    Clinical Impression Statement Skilled session foucsed on upright posture, with emphasis on increased posterior weight shift as she tends to demonstrate increased forward trunk flexion at all times.  Also continue to work on gait without device with +2A to work on postural control, increased R and L forward lateral weight shift during stance phase of gait.  Tolerated well.     Rehab Potential Fair   Clinical Impairments Affecting Rehab Potential Chronicity of condition, continued medical management, but has very strong family and financial support   PT Frequency 2x / week   PT Duration 8 weeks  with ability to increase to 3x/wk for 4 weeks if progressing    PT Treatment/Interventions ADLs/Self Care Home Management;DME Instruction;Gait training;Stair training;Functional mobility training;Therapeutic activities;Patient/family education;Cognitive remediation;Neuromuscular re-education;Therapeutic exercise;Balance training;Orthotic Fit/Training;Wheelchair mobility training;Manual techniques;Splinting;Visual/perceptual remediation/compensation;Vestibular;Passive range of motion;Electrical Stimulation   PT Next Visit Plan Sitting balance/anterior weight shift: transfer training-standing; standing balance ; gait training-I would do without braces, we did on 03/12/16 with great success.     Consulted and Agree with Plan of Care Patient;Family member/caregiver   Family Member Consulted caregiver, [name]       Patient will benefit from skilled therapeutic intervention in order to improve the following deficits and impairments:  Abnormal gait, Decreased balance, Decreased endurance, Decreased cognition, Cardiopulmonary status limiting activity, Decreased activity tolerance,  Decreased coordination, Decreased strength, Impaired flexibility, Impaired tone, Decreased mobility, Increased muscle spasms, Impaired vision/preception, Decreased range of motion, Impaired UE functional use, Postural dysfunction, Improper body mechanics, Impaired perceived functional ability, Decreased knowledge of precautions, Decreased knowledge of use of DME  Visit Diagnosis: Unsteadiness on feet  Muscle weakness (generalized)  Abnormal posture  Stiffness of left hip, not elsewhere classified  Stiffness of right hip, not elsewhere classified  Other symptoms and signs involving the nervous system     Problem List Patient Active Problem List   Diagnosis Date Noted  . Heterotopic ossification of bone 03/10/2016  . Acute on chronic respiratory failure with hypoxia ([facility])   . Acute hypoxemic respiratory failure ([place])   . Acute blood loss anemia   . Atelectasis 02/04/2016  . Debility 02/03/2016  . Pneumonia of both lower lobes due to Pseudomonas species ([place])   . Pneumonia of both lower lobes due to methicillin susceptible Staphylococcus aureus (MSSA) ([place])   . Acute respiratory failure with hypoxia ([place])   . Acute encephalopathy   . Seizures ([place])   . Sepsis ([place])   . Hypoxia   . Pain   . HCAP (healthcare-associated pneumonia) 12/28/2015  . Chronic respiratory failure ([place]) 12/28/2015  . Acute tracheobronchitis 12/24/2015  . Tracheostomy tube present ([place])   . Tracheal stenosis   . Chronic respiratory failure with hypoxia ([facility])   . 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[place]) 10/20/2015  . Tracheostomy status ([place]) 09/26/2015  . Allergic rhinitis 09/26/2015  . 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 Movement disorder 09/22/2015  . Encephalitis 09/22/2015  . Chest pain 02/07/2014  . Premature atrial contractions 01/13/2014  . Abnormality of gait 12/04/2013  . Hyperlipidemia 12/21/2011  . Cardiovascular risk factor 12/21/2011  . Bunion 01/31/2008  . Metatarsalgia of both feet 09/20/2007  .  FLAT FOOT 09/20/2007  . HALLUX RIGIDUS, ACQUIRED 09/20/2007    Cameron Sprang, PT, MPT [facility] [address] [place], [place], [address] Phone: [phone]   Fax:  [phone]  03/15/16, 11:29 AM  Name: ROKHAYA PATCHELL MRN: [id number] Date of Birth: 1950-10-18

## 2016-03-15 NOTE — Therapy (Signed)
[facility] [address] [place], [place], [address] Phone: [phone]   Fax:  [phone]  Occupational Therapy Treatment  Patient Details  Name: Judy Spears MRN: [id number] Date of Birth: 01/08/1951 Referring Provider: Dr. Quita Skye. [name]  Encounter Date: 03/15/2016      OT End of Session - 03/15/16 1259    Visit Number 4   Number of Visits 16   Date for OT Re-Evaluation 04/26/16   OT Start Time 0845   OT Stop Time 0929   OT Time Calculation (min) 44 min   Activity Tolerance Patient tolerated treatment well      Past Medical History:  Diagnosis Date  . Arthritis    lt great toe  . Complication of anesthesia    pt has had headaches post op-did advise to hydra well preop  . Hair loss    right-sided  . History of exercise stress test    a. ETT (12/15) with 12:00 exercise, no ST changes, occasional PACs.   . Hyperlipidemia   . Insomnia   . Migraine headache   . 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 Postmenopausal   . Premature atrial contractions    a. Holter (12/15) with 8% PACs, no atrial runs or atrial fibrillation.     Past Surgical History:  Procedure Laterality Date  . BREAST BIOPSY  01/01/2011   Procedure: BREAST BIOPSY WITH NEEDLE LOCALIZATION;  Surgeon: Rolm Bookbinder, MD;  Location: [facility];  Service: General;  Laterality: Left;  left breast wire localization biopsy  . cataracts right eye    . CESAREAN SECTION  1986  . HERNIA REPAIR  2000   RIH  . opirectinal membrane peel    . RHINOPLASTY  1983    There were no vitals filed for this visit.      Subjective Assessment - 03/15/16 0847    Subjective  What am I supposed to do?   Patient is accompained by: Family member  husband and caregiver   Pertinent History see epic snapshot - ABI/encephalitis/hypoxia from virus; recent hospitalization due to ARF;    Patient Stated Goals Pt unable to state.     Currently in Pain? Yes   Pain Location Back   Pain Orientation Left;Lower   Pain Descriptors / Indicators Aching;Sore   Pain Type Chronic pain   Pain Onset More than a month ago   Pain Frequency Intermittent   Aggravating Factors  sitting in chair too long   Pain Relieving Factors laying down,                       OT Treatments/Exercises (OP) - 03/15/16 0001      Neurological Re-education Exercises   Other Exercises 1 Neuro re ed to address transfers (squat pivot with mod assist to the left), sit to stand, static and dynamic standing postural control and alignment, dynamic weight shifting, finding midline in sitting and standing.  Pt with improving postural alignment and beginnin ability to detect weight shifts.  Pt also with improving sit to stand, and transfers.                    OT Short Term Goals - 03/15/16 1248      OT SHORT TERM GOAL #1   Title Pt, husband and cargiver will be mod I with HEP - 03/29/2016   Status On-going     OT SHORT TERM GOAL #2  Title Pt will be able to sit with supervision without UE support and vc's to assist with midline 75% while engage in functional task   Status On-going     OT [place] #3   Title Pt will be able to complete sit to stand consistently with mod A for toilet transfers (max a for pivot)   Status On-going     OT SHORT TERM GOAL #4   Title Pt, husband and caregiver will verbalize understanding of various bathroom accomodations to reduce long term caregiver burden   Status On-going           OT Long Term Goals - 03/15/16 [place] #1   Title Pt, husband and caregiver will be mod I with upgraded HEP prn - 05/26/2016   Status On-going     OT LONG TERM GOAL #2   Title Pt, husband and caregiver will be mod I with splint wear and care    Status On-going     OT LONG TERM GOAL #3   Title Pt will be able to wash face with min a only   Status On-going     OT LONG TERM GOAL #4    Title Pt will be able to sit without UE support with supevision 100% of the time with no more than 2 vc's while engaged in functional activity.   Status On-going     OT LONG TERM GOAL #5   Title Pt, husband and caregiver will verbalize understanding of potential after care therapeutic opportunities.    Status On-going               Plan - 03/15/16 1249    Clinical Impression Statement Pt progressing toward goals. Pt with improved ability to vocalize words, as well as improve postural control and alignment in sitting and standing.   Rehab Potential Fair   Clinical Impairments Affecting Rehab Potential severity of deficits, very slow rate of recovery   OT Frequency 2x / week   OT Duration 8 weeks   OT Treatment/Interventions Self-care/ADL training;Ultrasound;Moist Heat;Electrical Stimulation;DME and/or AE instruction;Neuromuscular education;Therapeutic exercise;Functional Mobility Training;Manual Therapy;Passive range of motion;Splinting;Therapeutic activities;Balance training;Patient/family education;Visual/perceptual remediation/compensation;Cognitive remediation/compensation   Plan fabricate right resting hand splint, sitting balance, sit to stand, stand to sit, standing balance, transitional movements, transfers   Consulted and Agree with Plan of Care Patient;Family member/caregiver   Family Member Consulted caregiver [name], husband [name]       Patient will benefit from skilled therapeutic intervention in order to improve the following deficits and impairments:  Decreased endurance, Decreased skin integrity, Impaired vision/preception, Improper body mechanics, Impaired perceived functional ability, Decreased knowledge of precautions, Cardiopulmonary status limiting activity, Decreased activity tolerance, Decreased knowledge of use of DME, Decreased strength, Impaired flexibility, Improper spinal/pelvic alignment, Impaired sensation, Difficulty walking, Decreased mobility, Decreased  balance, Decreased cognition, Decreased range of motion, Impaired tone, Impaired UE functional use, Decreased safety awareness, Decreased coordination, Pain  Visit Diagnosis: Muscle weakness (generalized)  Unsteadiness on feet  Abnormal posture  Other symptoms and signs involving the nervous system  Stiffness of left shoulder, not elsewhere classified  Stiffness of right shoulder, not elsewhere classified  Stiffness of left elbow, not elsewhere classified  Frontal lobe and executive function deficit    Problem List Patient Active Problem List   Diagnosis Date Noted  . Heterotopic ossification of bone 03/10/2016  . Acute on chronic respiratory failure with hypoxia ([place])   . Acute hypoxemic respiratory failure ([place])   .  Acute blood loss anemia   . Atelectasis 02/04/2016  . Debility 02/03/2016  . Pneumonia of both lower lobes due to Pseudomonas species ([place])   . Pneumonia of both lower lobes due to methicillin susceptible Staphylococcus aureus (MSSA) ([place])   . Acute respiratory failure with hypoxia ([place])   . Acute encephalopathy   . Seizures ([place])   . Sepsis ([place])   . Hypoxia   . Pain   . HCAP (healthcare-associated pneumonia) 12/28/2015  . Chronic respiratory failure ([place]) 12/28/2015  . Acute tracheobronchitis 12/24/2015  . Tracheostomy tube present ([place])   . Tracheal stenosis   . Chronic respiratory failure with hypoxia ([place])   . 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 Tracheostomy status ([place]) 09/26/2015  . Allergic rhinitis 09/26/2015  . 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 Movement disorder 09/22/2015  . Encephalitis 09/22/2015  . Chest pain 02/07/2014  . Premature atrial contractions 01/13/2014  . Abnormality of gait 12/04/2013  . Hyperlipidemia 12/21/2011  . Cardiovascular risk factor 12/21/2011  . Bunion 01/31/2008  . Metatarsalgia of both feet 09/20/2007  . FLAT FOOT 09/20/2007  . HALLUX RIGIDUS, ACQUIRED 09/20/2007    Quay Burow ,  OTR/L 03/15/2016, 1:00 PM  [facility] [address] [place], [place], [address] Phone: [phone]   Fax:  [phone]  Name: KIRSTINE WISLER MRN: [id number] Date of Birth: 10-06-1950

## 2016-03-15 NOTE — Therapy (Signed)
[facility] [address] [place], [place], [address] Phone: [phone]   Fax:  [phone]  Speech Language Pathology Treatment  Patient Details  Name: Judy Spears MRN: [id number] Date of Birth: 1950-06-30 Referring Provider: Charlett Blake, MD  Encounter Date: 03/15/2016      End of Session - 03/15/16 0928    Visit Number 5   Number of Visits 17   Date for SLP Re-Evaluation 04/30/16   SLP Start Time 0807   SLP Stop Time  0844   SLP Time Calculation (min) 37 min   Activity Tolerance Patient tolerated treatment well      Past Medical History:  Diagnosis Date  . Arthritis    lt great toe  . Complication of anesthesia    pt has had headaches post op-did advise to hydra well preop  . Hair loss    right-sided  . History of exercise stress test    a. ETT (12/15) with 12:00 exercise, no ST changes, occasional PACs.   . Hyperlipidemia   . Insomnia   . Migraine headache   . 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 Postmenopausal   . Premature atrial contractions    a. Holter (12/15) with 8% PACs, no atrial runs or atrial fibrillation.     Past Surgical History:  Procedure Laterality Date  . BREAST BIOPSY  01/01/2011   Procedure: BREAST BIOPSY WITH NEEDLE LOCALIZATION;  Surgeon: Rolm Bookbinder, MD;  Location: [facility];  Service: General;  Laterality: Left;  left breast wire localization biopsy  . cataracts right eye    . CESAREAN SECTION  1986  . HERNIA REPAIR  2000   RIH  . opirectinal membrane peel    . RHINOPLASTY  1983    There were no vitals filed for this visit.      Subjective Assessment - 03/15/16 0902    Subjective Pt enters with trach capped today   Patient is accompained by: Family member  spouse, Judy Spears; caregiver Judy Spears               ADULT SLP TREATMENT - 03/15/16 0001      General Information   Behavior/Cognition Alert;Cooperative;Pleasant  mood;Distractible   Patient Positioning Upright in chair   Oral care provided --  Husband provided immediately prior to appointment     Treatment Provided   Treatment provided Dysphagia;Cognitive-Linquistic     Dysphagia Treatment   Respiratory Status Room air   Oral Cavity - Dentition Adequate natural dentition   Treatment Methods Skilled observation;Upgraded PO texture trial;Differential diagnosis;Therapeutic exercise;Patient/caregiver education   Patient observed directly with PO's Yes   Type of PO's observed Thin liquids   Feeding Needs assist;Total assist   Liquids provided via Teaspoon   Oral Phase Signs & Symptoms Anterior loss/spillage   Pharyngeal Phase Signs & Symptoms Suspected delayed swallow initiation;Wet vocal quality   Type of cueing Verbal   Amount of cueing Moderate   Other treatment/comments SLP presented teaspoons of water using spoon patient's husband brought from home. Patient required min-mod verbal cues for maximal jaw opening, is noted with anterior oral spillage in 30% of trials. Patient with appearance of timely swallow in 50% of trials, with <3 second delay in remaining 50% of trials. Hydrophonic voice noted x1; patient requires verbal cue to clear throat, reswallow. Provided education to patient and family regarding potential benefits of PMSV for swallowing which can be further determined during objective examination. Will proceed  with scheduling MBS; instructed pt, husband and caregiver to bring PMSV to [facility] appointment.     Pain Assessment   Pain Assessment Faces   Faces Pain Scale Hurts little more   Pain Location back/legs   Pain Descriptors / Indicators Discomfort  fidgeting in chair   Pain Intervention(s) Monitored during session;Repositioned  Husband repositioned     Cognitive-Linquistic Treatment   Treatment focused on Voice;Dysarthria;Patient/family/caregiver education   Skilled Treatment Provided training in strategies to improve intelligibility  including adjusting posture for maximal breath support, respiratory coordination for speech, increased volume and reduced rate of speech. Patient requires mod-max verbal cues and demonstration, achieves intelligibility with 3-5 word phrases in 50% of trialed opportunities. Continued training of use of whiteboard and multimodal communication strategies for spelling 3-4 letter words. She spells 3/3 of words accurately, though she requires intermittent cues for attention and max verbal and visual cues to slow rate, check for receipt of message.     Tracheostomy [place] 4 mm Uncuffed   Tracheostomy Properties Placement Date: 02/24/16 Placement Time: [place] By: ICU physician Brand: [place]iley Size (mm): 4 mm Style: Uncuffed   Status Capped;Secured   Site Assessment Clean;Dry   Ties Assessment Clean;Dry;Secure     Assessment / Recommendations / Plan   Plan Continue with current plan of care;MBS;New goals to be determined pending instrumental study     Dysphagia Recommendations   Diet recommendations NPO;Other(comment)  water, ice chips after oral care   Liquids provided via Teaspoon   Medication Administration Via alternative means   Supervision Full supervision/cueing for compensatory strategies   Compensations Multiple dry swallows after each bite/sip;Effortful swallow  minimize environmental distractions   Postural Changes and/or Swallow Maneuvers Seated upright 90 degrees     General Recommendations   Oral Care Recommendations Oral care prior to ice chips;Staff/trained caregiver to provide oral care;Oral care Q4 per protocol     Progression Toward Goals   Progression toward goals Progressing toward goals          SLP Education - 03/15/16 0927    Education provided Yes   Education Details postural and breath support for speech, slowed rate, effortful swallow, MBS    Person(s) Educated Patient;Spouse;Caregiver(s)   Methods Explanation;Demonstration;Verbal cues   Comprehension  Verbalized understanding;Returned demonstration;Need further instruction          SLP Short Term Goals - 03/15/16 1242      SLP SHORT TERM GOAL #1   Title Patient will utilize multimodal communication/AAC to spell word level responses in 80% of opportunites with max A in 2 therapy sessions.   Baseline 2/19   Time 2   Period Weeks   Status On-going     SLP SHORT TERM GOAL #2   Title Patient will utilize intelligibility strategies, breath support with mod A for phrase repetition in 75% of trials.   Baseline 2.12   Time 2   Period Weeks   Status On-going     SLP SHORT TERM GOAL #3   Title Pt will swallow ice Judy Spears/H20 via spoon in less than 5 seconds average with usual min to mod A 4/5 trials   Baseline 2.12.18, 03-12-16, 03-15-16   Time 2   Period Weeks   Status Achieved          SLP Long Term Goals - 03/15/16 1244      SLP LONG TERM GOAL #1   Title Patient will utilize multimodal communication/AAC to clarify wants, needs, thoughts or ideas during communication breakdowns with max  A   Time 6   Period Weeks   Status On-going     SLP LONG TERM GOAL #2   Title Patient will utilize intelligibility strategies, breath support with min A for 5 word functional phrases with 80% accuracy.   Time 6   Period Weeks   Status On-going     SLP LONG TERM GOAL #3   Title Patient will demonstrate readiness for MBS to determine safety for pleasure feeds by timely initiation of swallow in 3/5 trials with ice chips/H20.   Baseline 2.12.18   Time 2   Period Weeks   Status Achieved          Plan - 03/15/16 [id number]    Clinical Impression Statement Patient with sustained alertness and attention to session tasks with rare cues for the entire session.  Pt's swallow trigger time was approx 3 seconds or less when she swallowed 1/2 teaspoons icewater. Recommend repeat MBS to identify safety of advanced PO trials, comfort feeds, aid in determining compensatory techniques and rehabilitative  exercises to improve swallow function. Recommend skilled ST in order to maximize functional communication, swallowing safety, reduce caregiver burden and improve quality of life.   Speech Therapy Frequency 2x / week   Duration Other (comment)   Treatment/Interventions Aspiration precaution training;Trials of upgraded texture/liquids;Compensatory strategies;Functional tasks;Patient/family education;Cueing hierarchy;Diet toleration management by SLP;Cognitive reorganization;Compensatory techniques;SLP instruction and feedback;Internal/external aids;Multimodal communcation approach   Potential to Achieve Goals Good   Potential Considerations Ability to learn/carryover information;Co-morbidities;Severity of impairments   SLP Home Exercise Plan PO trials of ice/H20 following oral care, continue utilizing multimodal techniques to improve relay of information, functional phrases   Consulted and Agree with Plan of Care Family member/caregiver;Patient   Family Member Consulted caregiver Judy Spears, husband Judy Spears      Patient will benefit from skilled therapeutic intervention in order to improve the following deficits and impairments:   Dysarthria and anarthria  Cognitive communication deficit  Dysphagia, oropharyngeal phase    Problem List Patient Active Problem List   Diagnosis Date Noted  . Heterotopic ossification of bone 03/10/2016  . Acute on chronic respiratory failure with hypoxia ([place])   . Acute hypoxemic respiratory failure ([place])   . Acute blood loss anemia   . Atelectasis 02/04/2016  . Debility 02/03/2016  . Pneumonia of both lower lobes due to Pseudomonas species ([place])   . Pneumonia of both lower lobes due to methicillin susceptible Staphylococcus aureus (MSSA) ([place])   . Acute respiratory failure with hypoxia ([place])   . Acute encephalopathy   . Seizures ([place])   . Sepsis ([place])   . Hypoxia   . Pain   . HCAP (healthcare-associated pneumonia) 12/28/2015  . Chronic respiratory failure  ([place]) 12/28/2015  . Acute tracheobronchitis 12/24/2015  . Tracheostomy tube present ([place])   . Tracheal stenosis   . Chronic respiratory failure with hypoxia ([place])   . 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[place]) 10/20/2015  . Tracheostomy status ([place]) 09/26/2015  . Allergic rhinitis 09/26/2015  . 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 Movement disorder 09/22/2015  . Encephalitis 09/22/2015  . Chest pain 02/07/2014  . Premature atrial contractions 01/13/2014  . Abnormality of gait 12/04/2013  . Hyperlipidemia 12/21/2011  . Cardiovascular risk factor 12/21/2011  . Bunion 01/31/2008  . Metatarsalgia of both feet 09/20/2007  . FLAT FOOT 09/20/2007  . HALLUX RIGIDUS, ACQUIRED 09/20/2007   Deneise Lever, [place] CF-SLP Speech-Language Pathologist [phone]  Aliene Altes 03/15/2016, 12:50 PM  [facility]  [place], [place], [address] Phone: [phone]   Fax:  [phone]   Name: Judy Spears MRN: [id number] Date of Birth: 03-11-1950

## 2016-03-16 ENCOUNTER — Other Ambulatory Visit: Payer: Self-pay | Admitting: *Deleted

## 2016-03-16 MED ORDER — AMLODIPINE BESYLATE 5 MG PO TABS: 5.0000 mg | ORAL_TABLET | Freq: Every day | ORAL | 2 refills | Status: DC

## 2016-03-17 ENCOUNTER — Encounter: Payer: Self-pay | Admitting: Occupational Therapy

## 2016-03-17 ENCOUNTER — Ambulatory Visit: Payer: BC Managed Care – PPO

## 2016-03-17 ENCOUNTER — Ambulatory Visit: Payer: BC Managed Care – PPO | Admitting: Occupational Therapy

## 2016-03-17 ENCOUNTER — Ambulatory Visit: Payer: BC Managed Care – PPO | Admitting: Physical Therapy

## 2016-03-17 DIAGNOSIS — R2689 Other abnormalities of gait and mobility: Secondary | ICD-10-CM

## 2016-03-17 DIAGNOSIS — R278 Other lack of coordination: Secondary | ICD-10-CM

## 2016-03-17 DIAGNOSIS — M25611 Stiffness of right shoulder, not elsewhere classified: Secondary | ICD-10-CM

## 2016-03-17 DIAGNOSIS — M25612 Stiffness of left shoulder, not elsewhere classified: Secondary | ICD-10-CM

## 2016-03-17 DIAGNOSIS — R1312 Dysphagia, oropharyngeal phase: Secondary | ICD-10-CM

## 2016-03-17 DIAGNOSIS — R293 Abnormal posture: Secondary | ICD-10-CM

## 2016-03-17 DIAGNOSIS — M6281 Muscle weakness (generalized): Secondary | ICD-10-CM

## 2016-03-17 DIAGNOSIS — M25622 Stiffness of left elbow, not elsewhere classified: Secondary | ICD-10-CM

## 2016-03-17 DIAGNOSIS — R41841 Cognitive communication deficit: Secondary | ICD-10-CM

## 2016-03-17 DIAGNOSIS — R29818 Other symptoms and signs involving the nervous system: Secondary | ICD-10-CM

## 2016-03-17 DIAGNOSIS — R2681 Unsteadiness on feet: Secondary | ICD-10-CM

## 2016-03-17 DIAGNOSIS — R471 Dysarthria and anarthria: Secondary | ICD-10-CM

## 2016-03-17 DIAGNOSIS — R41844 Frontal lobe and executive function deficit: Secondary | ICD-10-CM

## 2016-03-17 NOTE — Therapy (Signed)
[facility] [address] [place], [place], [address] Phone: [phone]   Fax:  [phone]  Occupational Therapy Treatment  Patient Details  Name: Judy Spears MRN: [id number] Date of Birth: 07/04/1950 Referring Provider: Dr. Quita Skye. [name]  Encounter Date: 03/17/2016      OT End of Session - 03/17/16 1505    Visit Number 5   Number of Visits 16   Date for OT Re-Evaluation 04/26/16   Authorization Type [employer] unlimited visits   OT Start Time 1404   OT Stop Time 1445   OT Time Calculation (min) 41 min   Activity Tolerance Patient tolerated treatment well   Behavior During Therapy [facility] for tasks assessed/performed      Past Medical History:  Diagnosis Date  . Arthritis    lt great toe  . Complication of anesthesia    pt has had headaches post op-did advise to hydra well preop  . Hair loss    right-sided  . History of exercise stress test    a. ETT (12/15) with 12:00 exercise, no ST changes, occasional PACs.   . Hyperlipidemia   . Insomnia   . Migraine headache   . 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 Postmenopausal   . Premature atrial contractions    a. Holter (12/15) with 8% PACs, no atrial runs or atrial fibrillation.     Past Surgical History:  Procedure Laterality Date  . BREAST BIOPSY  01/01/2011   Procedure: BREAST BIOPSY WITH NEEDLE LOCALIZATION;  Surgeon: Rolm Bookbinder, MD;  Location: [facility];  Service: General;  Laterality: Left;  left breast wire localization biopsy  . cataracts right eye    . CESAREAN SECTION  1986  . HERNIA REPAIR  2000   RIH  . opirectinal membrane peel    . RHINOPLASTY  1983    There were no vitals filed for this visit.      Subjective Assessment - 03/17/16 1453    Subjective  "I was in [place], [place]." and when challenged patient - "probably not"   Patient is accompained by: Family member   Pertinent History see epic snapshot  - ABI/encephalitis/hypoxia from virus; recent hospitalization due to ARF;    Patient Stated Goals Pt unable to state.    Pain Score 0-No pain                      OT Treatments/Exercises (OP) - 03/17/16 0001      Neurological Re-education Exercises   Other Exercises 1 Neuromuscular reeducation to address alignment and activation for sit to stand and stand balance as needed for ADL.  Patient with improved ability to lift off a slightly raised surface using primarilly LE's - more challenged for active trunk shortening/ lengthening or rotation as needed for scooting.  Patient mostly limited with active trunk shortening to right side.     Other Exercises 2 Worked on alignment for sit to stand and stand balance.  Patient with significant improvement in static stand balance today.  Once assisted to midline, patient able to stand for more than 10 seconds with contact only assistance at left knee.  Patient beginning to correct balance with constant cueing.                 OT Education - 03/17/16 1504    Education provided Yes   Education Details Importance of flexing forward "bounce forward" to flex trunk at hips to  prepare for transfer.     Person(s) Educated Patient;Spouse;Caregiver(s)   Methods Explanation;Demonstration   Comprehension Verbalized understanding;Returned demonstration          OT Short Term Goals - 03/17/16 1508      OT SHORT TERM GOAL #1   Title Pt, husband and cargiver will be mod I with HEP - 03/29/2016   Status On-going     OT SHORT TERM GOAL #2   Title Pt will be able to sit with supervision without UE support and vc's to assist with midline 75% while engage in functional task   Status On-going     OT [place] #3   Title Pt will be able to complete sit to stand consistently with mod A for toilet transfers (max a for pivot)   Status On-going     OT SHORT TERM GOAL #4   Title Pt, husband and caregiver will verbalize understanding of  various bathroom accomodations to reduce long term caregiver burden           OT Long Term Goals - 03/15/16 1248      OT LONG TERM GOAL #1   Title Pt, husband and caregiver will be mod I with upgraded HEP prn - 05/26/2016   Status On-going     OT LONG TERM GOAL #2   Title Pt, husband and caregiver will be mod I with splint wear and care    Status On-going     OT LONG TERM GOAL #3   Title Pt will be able to wash face with min a only   Status On-going     OT LONG TERM GOAL #4   Title Pt will be able to sit without UE support with supevision 100% of the time with no more than 2 vc's while engaged in functional activity.   Status On-going     OT LONG TERM GOAL #5   Title Pt, husband and caregiver will verbalize understanding of potential after care therapeutic opportunities.    Status On-going               Plan - 03/17/16 1505    Clinical Impression Statement Patient showing steady progress related to alignment and functional mobility as realted to ADL.     Rehab Potential Fair   Clinical Impairments Affecting Rehab Potential severity of deficits, very slow rate of recovery   OT Frequency 2x / week   OT Duration 8 weeks   OT Treatment/Interventions Self-care/ADL training;Ultrasound;Moist Heat;Electrical Stimulation;DME and/or AE instruction;Neuromuscular education;Therapeutic exercise;Functional Mobility Training;Manual Therapy;Passive range of motion;Splinting;Therapeutic activities;Balance training;Patient/family education;Visual/perceptual remediation/compensation;Cognitive remediation/compensation   Plan Fabricate right resting hand splint - monitor 3rd digit PIP flexion   OT Home Exercise Plan Reviewed plan for home management for left elbow range of motion - JAS 3x/day FOR 30 min./time, and locked elbow extesnion brace at night   Consulted and Agree with Plan of Care Patient;Family member/caregiver   Family Member Consulted caregiver [name], husband [name]        Patient will benefit from skilled therapeutic intervention in order to improve the following deficits and impairments:  Decreased endurance, Decreased skin integrity, Impaired vision/preception, Improper body mechanics, Impaired perceived functional ability, Decreased knowledge of precautions, Cardiopulmonary status limiting activity, Decreased activity tolerance, Decreased knowledge of use of DME, Decreased strength, Impaired flexibility, Improper spinal/pelvic alignment, Impaired sensation, Difficulty walking, Decreased mobility, Decreased balance, Decreased cognition, Decreased range of motion, Impaired tone, Impaired UE functional use, Decreased safety awareness, Decreased coordination, Pain  Visit Diagnosis: Muscle  weakness (generalized)  Unsteadiness on feet  Abnormal posture  Other symptoms and signs involving the nervous system  Stiffness of left shoulder, not elsewhere classified  Stiffness of right shoulder, not elsewhere classified  Stiffness of left elbow, not elsewhere classified  Frontal lobe and executive function deficit  Other lack of coordination    Problem List Patient Active Problem List   Diagnosis Date Noted  . Heterotopic ossification of bone 03/10/2016  . Acute on chronic respiratory failure with hypoxia ([place])   . Acute hypoxemic respiratory failure ([place])   . Acute blood loss anemia   . Atelectasis 02/04/2016  . Debility 02/03/2016  . Pneumonia of both lower lobes due to Pseudomonas species ([place])   . Pneumonia of both lower lobes due to methicillin susceptible Staphylococcus aureus (MSSA) ([place])   . Acute respiratory failure with hypoxia ([place])   . Acute encephalopathy   . Seizures ([place])   . Sepsis ([place])   . Hypoxia   . Pain   . HCAP (healthcare-associated pneumonia) 12/28/2015  . Chronic respiratory failure ([place]) 12/28/2015  . Acute tracheobronchitis 12/24/2015  . Tracheostomy tube present ([place])   . Tracheal stenosis   . Chronic respiratory  failure with hypoxia ([place])   . 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[place]) 10/20/2015  . Tracheostomy status ([place]) 09/26/2015  . Allergic rhinitis 09/26/2015  . 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 Movement disorder 09/22/2015  . Encephalitis 09/22/2015  . Chest pain 02/07/2014  . Premature atrial contractions 01/13/2014  . Abnormality of gait 12/04/2013  . Hyperlipidemia 12/21/2011  . Cardiovascular risk factor 12/21/2011  . Bunion 01/31/2008  . Metatarsalgia of both feet 09/20/2007  . FLAT FOOT 09/20/2007  . HALLUX RIGIDUS, ACQUIRED 09/20/2007    Mariah Milling, OTR/L 03/17/2016, 3:09 PM  [facility] [address] [place], [place], [address] Phone: [phone]   Fax:  [phone]  Name: Judy Spears MRN: [id number] Date of Birth: [birth year]

## 2016-03-18 ENCOUNTER — Other Ambulatory Visit (HOSPITAL_COMMUNITY): Payer: Self-pay | Admitting: Family Medicine

## 2016-03-18 DIAGNOSIS — R1319 Other dysphagia: Secondary | ICD-10-CM

## 2016-03-18 NOTE — Therapy (Signed)
[facility] [address] [place], [place], [address] Phone: [phone]   Fax:  [phone]  Speech Language Pathology Treatment  Patient Details  Name: Judy Spears MRN: [id number] Date of Birth: Jun 04, 1950 Referring Provider: Charlett Blake, MD  Encounter Date: 03/17/2016      End of Session - 03/18/16 1251    Visit Number 6   Number of Visits 17   Date for SLP Re-Evaluation 04/30/16   SLP Start Time 1448   SLP Stop Time  [id number]   SLP Time Calculation (min) 44 min   Activity Tolerance Patient tolerated treatment well      Past Medical History:  Diagnosis Date  . Arthritis    lt great toe  . Complication of anesthesia    pt has had headaches post op-did advise to hydra well preop  . Hair loss    right-sided  . History of exercise stress test    a. ETT (12/15) with 12:00 exercise, no ST changes, occasional PACs.   . Hyperlipidemia   . Insomnia   . Migraine headache   . 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 Postmenopausal   . Premature atrial contractions    a. Holter (12/15) with 8% PACs, no atrial runs or atrial fibrillation.     Past Surgical History:  Procedure Laterality Date  . BREAST BIOPSY  01/01/2011   Procedure: BREAST BIOPSY WITH NEEDLE LOCALIZATION;  Surgeon: Rolm Bookbinder, MD;  Location: [facility];  Service: General;  Laterality: Left;  left breast wire localization biopsy  . cataracts right eye    . CESAREAN SECTION  1986  . HERNIA REPAIR  2000   RIH  . opirectinal membrane peel    . RHINOPLASTY  1983    There were no vitals filed for this visit.      Subjective Assessment - 03/18/16 1233    Subjective Pt enters with trach capped today   Patient is accompained by: --  spouse, [name] and caregiver, [name]   Currently in Pain? No/denies               ADULT SLP TREATMENT - 03/18/16 0001      General Information   Behavior/Cognition  Alert;Cooperative;Pleasant mood;Distractible     Treatment Provided   Treatment provided Dysphagia;Cognitive-Linquistic     Dysphagia Treatment   Respiratory Status Room air   Oral Cavity - Dentition Adequate natural dentition   Treatment Methods Skilled observation;Therapeutic exercise   Patient observed directly with PO's Yes   Type of PO's observed Thin liquids;Ice chips   Feeding Needs assist   Liquids provided via Teaspoon   Oral Phase Signs & Symptoms Anterior loss/spillage  consistently   Pharyngeal Phase Signs & Symptoms Suspected delayed swallow initiation;Wet vocal quality   Type of cueing Verbal;Visual   Amount of cueing Moderate   Other treatment/comments SLP used pt's spoon from home to present pt approx 1/2 teaspoons icewater after SLP performed oral care. Caregiver stated she performed oral care immediately prior to providing pt with <1 teaspoons of water at home as well, this morning. Pt with wet voice on 10% of trials. Swallows appeared well-timed for the first 7-8 minutes then decr'd thyroid elevation seen, with what appeared to be quick activation of swallow response, like premature spillage occurred. Pt without overt s/s aspiration <10% of the time, however, in first approx 10 mintues of therapy. After 13 minutes pt demonstrated delayed cough with consistent suspected  premature spillage into pharynx due to very fast thyroid elevation post presentation. Overall trigger time was 2-3 seconds however shorter durations at end of therapy do not necessarily mean swallow mechanism is improving. At this time SLP switched to speech/language therapy..     Cognitive-Linquistic Treatment   Treatment focused on Voice;Dysarthria   Skilled Treatment SLP targeted timing of breathing and initiation of voicing at top of breath. Explanation was provided with graphic depicting a hill representing breath pattern and at top of "hill" SLP put "talk". Pt nodded understanding.  Demo, visual and  verbal cues were all used with pt today with rote speech to retrain speech musculature the timing of vocalization and breathing, with approx 10% success.     Tracheostomy Shiley 4 mm Uncuffed   Tracheostomy Properties Placement Date: 02/24/16 Placement Time: [place] By: ICU physician Brand: Shiley Size (mm): 4 mm Style: Uncuffed     Assessment / Recommendations / Plan   Plan Continue with current plan of care;MBS;New goals to be determined pending instrumental study     Dysphagia Recommendations   Diet recommendations NPO;Other(comment)  water, ice chips after oral care   Liquids provided via Teaspoon   Medication Administration Via alternative means   Supervision Full supervision/cueing for compensatory strategies   Compensations Multiple dry swallows after each bite/sip;Effortful swallow   Postural Changes and/or Swallow Maneuvers Seated upright 90 degrees     General Recommendations   Oral Care Recommendations Oral care prior to ice chips;Staff/trained caregiver to provide oral care;Oral care Q4 per protocol     Progression Toward Goals   Progression toward goals Progressing toward goals            SLP Short Term Goals - 03/17/16 1659      SLP SHORT TERM GOAL #1   Title Patient will utilize multimodal communication/AAC to spell word level responses in 80% of opportunites with max A in 2 therapy sessions.   Baseline 2/19   Time 2   Period Weeks   Status On-going     SLP SHORT TERM GOAL #2   Title Patient will utilize intelligibility strategies, breath support with mod A for phrase repetition in 75% of trials.   Baseline 2.12   Time 2   Period Weeks   Status On-going     SLP SHORT TERM GOAL #3   Title Pt will swallow ice chip/H20 via spoon in less than 5 seconds average with usual min to mod A 4/5 trials   Baseline 2.12.18, 03-12-16, 03-15-16   Time 2   Period Weeks   Status Achieved          SLP Long Term Goals - 03/17/16 1800      SLP LONG TERM GOAL #1    Title Patient will utilize multimodal communication/AAC to clarify wants, needs, thoughts or ideas during communication breakdowns with max A   Time 6   Period Weeks   Status On-going     SLP LONG TERM GOAL #2   Title Patient will utilize intelligibility strategies, breath support with min A for 5 word functional phrases with 80% accuracy.   Time 6   Period Weeks   Status On-going     SLP LONG TERM GOAL #3   Title Patient will demonstrate readiness for MBS to determine safety for pleasure feeds by timely initiation of swallow in 3/5 trials with ice chips/H20.   Baseline 2.12.18   Time 2   Period Weeks   Status Achieved  Plan - 03/18/16 1251    Clinical Impression Statement Patient with sustained alertness and attention to session tasks with rare cues for the entire session.  Pt's swallow trigger time was approx 3 seconds or less when she swallowed 1/2 teaspoons icewater. Recommend repeat MBS to identify safety of advanced PO trials, comfort feeds, aid in determining compensatory techniques and rehabilitative exercises to improve swallow function. Recommend skilled ST in order to maximize functional communication, swallowing safety, reduce caregiver burden and improve quality of life.   Speech Therapy Frequency 2x / week   Duration Other (comment)   Treatment/Interventions Aspiration precaution training;Trials of upgraded texture/liquids;Compensatory strategies;Functional tasks;Patient/family education;Cueing hierarchy;Diet toleration management by SLP;Cognitive reorganization;Compensatory techniques;SLP instruction and feedback;Internal/external aids;Multimodal communcation approach   Potential to Achieve Goals Good   Potential Considerations Ability to learn/carryover information;Co-morbidities;Severity of impairments   SLP Home Exercise Plan PO trials of ice/H20 following oral care, continue utilizing multimodal techniques to improve relay of information, functional phrases    Consulted and Agree with Plan of Care Family member/caregiver;Patient   Family Member Consulted caregiver [name], husband [name]      Patient will benefit from skilled therapeutic intervention in order to improve the following deficits and impairments:   Dysphagia, oropharyngeal phase  Dysarthria and anarthria  Cognitive communication deficit    Problem List Patient Active Problem List   Diagnosis Date Noted  . Heterotopic ossification of bone 03/10/2016  . Acute on chronic respiratory failure with hypoxia ([place])   . Acute hypoxemic respiratory failure ([place])   . Acute blood loss anemia   . Atelectasis 02/04/2016  . Debility 02/03/2016  . Pneumonia of both lower lobes due to Pseudomonas species ([place])   . Pneumonia of both lower lobes due to methicillin susceptible Staphylococcus aureus (MSSA) ([place])   . Acute respiratory failure with hypoxia ([place])   . Acute encephalopathy   . Seizures ([place])   . Sepsis ([place])   . Hypoxia   . Pain   . HCAP (healthcare-associated pneumonia) 12/28/2015  . Chronic respiratory failure ([place]) 12/28/2015  . Acute tracheobronchitis 12/24/2015  . Tracheostomy tube present ([place])   . Tracheal stenosis   . Chronic respiratory failure with hypoxia ([place])   . 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[place]) 10/20/2015  . Tracheostomy status ([place]) 09/26/2015  . Allergic rhinitis 09/26/2015  . 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 Movement disorder 09/22/2015  . Encephalitis 09/22/2015  . Chest pain 02/07/2014  . Premature atrial contractions 01/13/2014  . Abnormality of gait 12/04/2013  . Hyperlipidemia 12/21/2011  . Cardiovascular risk factor 12/21/2011  . Bunion 01/31/2008  . Metatarsalgia of both feet 09/20/2007  . FLAT FOOT 09/20/2007  . HALLUX RIGIDUS, ACQUIRED 09/20/2007    [facility] ,MS, CCC-SLP  03/18/2016, 1:01 PM  [facility] [address] [place], [place], [address] Phone: [phone]   Fax:   [phone]   Name: Judy Spears MRN: [id number] Date of Birth: [birth year]

## 2016-03-18 NOTE — Therapy (Signed)
[facility] [address] [place], [place], [address] Phone: [phone]   Fax:  [phone]  Physical Therapy Treatment  Patient Details  Name: Judy Spears MRN: [id number] Date of Birth: [birth year] Referring Provider: Alger Simons, MD/Judy Letta Pate, MD  Encounter Date: 03/17/2016      PT End of Session - 03/18/16 1913    Visit Number 6   Number of Visits 20   Date for PT Re-Evaluation 04/30/16   Authorization Type BCBS   PT Start Time 1532   PT Stop Time [id number]   PT Time Calculation (min) 49 min      Past Medical History:  Diagnosis Date  . Arthritis    lt great toe  . Complication of anesthesia    pt has had headaches post op-did advise to hydra well preop  . Hair loss    right-sided  . History of exercise stress test    a. ETT (12/15) with 12:00 exercise, no ST changes, occasional PACs.   . Hyperlipidemia   . Insomnia   . Migraine headache   . 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 Postmenopausal   . Premature atrial contractions    a. Holter (12/15) with 8% PACs, no atrial runs or atrial fibrillation.     Past Surgical History:  Procedure Laterality Date  . BREAST BIOPSY  01/01/2011   Procedure: BREAST BIOPSY WITH NEEDLE LOCALIZATION;  Surgeon: Rolm Bookbinder, MD;  Location: [facility];  Service: General;  Laterality: Left;  left breast wire localization biopsy  . cataracts right eye    . CESAREAN SECTION  1986  . HERNIA REPAIR  2000   RIH  . opirectinal membrane peel    . RHINOPLASTY  1983    There were no vitals filed for this visit.      Subjective Assessment - 03/18/16 1213    Subjective Husband (Judy Spears) reports that pt wanted to walk at home rather than using the stair lift the other day, so he walked with her in front of her and states she did well   Patient is accompained by: Family member   Pertinent History Precautions:  PEG.  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  Another round of botox in LLE on 02/06/16   Limitations House hold activities;Walking;Standing;Sitting   Patient Stated Goals Per husband, "For Judy Spears to be as independent as possible."   Currently in Pain? No/denies                         [facility] Adult PT Treatment/Exercise - 03/18/16 0001      Transfers   Transfers Sit to Stand   Sit to Stand 4: Min assist   Sit to Stand Details Tactile cues for sequencing;Verbal cues for technique;Tactile cues for posture;Tactile cues for weight shifting   Sit to Stand Details (indicate cue type and reason) pt able to lean anteriorly with min to CGA   Stand to Sit 3: Mod assist;2: Max assist  max assist needed after amb. - due to fatigue   Stand to Sit Details (indicate cue type and reason) Manual facilitation for weight shifting;Tactile cues for weight shifting;Verbal cues for technique;Tactile cues for sequencing;Tactile cues for initiation;Verbal cues for sequencing;Manual facilitation for weight bearing   Stand Pivot  Transfers 4: Min assist;3: Mod assist  min assist when not fatigued (performance fluctuates)     Ambulation/Gait   Ambulation/Gait Yes   Ambulation/Gait Assistance 3: Mod assist  +2 mod assist   Ambulation Distance (Feet) 50 Feet  40, 25   Assistive device 2 person hand held assist  platform RW trialed - unsuccessful    Gait Pattern Decreased dorsiflexion - right;Decreased hip/knee flexion - right;Right flexed knee in stance;Left flexed knee in stance;Right foot flat;Left foot flat;Scissoring;Lateral trunk lean to left;Decreased weight shift to right;Narrow base of support;Poor foot clearance - right;Decreased trunk rotation   Ambulation Surface Level;Indoor     NeuroRe-ed; sitting balance activity with use of  rockerboard to fascilitate anterior/posterior weight shift and then lateral weight shift With tactile cues to fascilitate Rt trunk shortening, Lt trunk lengthening with Rt ear toward shoulder (this activity assisted by  Antony Salmon, OTR)  Used blue peanut physioball - pt stood from high/low mat table and transferred onto ball, straddling ball for hip adductor stretching - Able to tolerate with mild c/o discomfort with stretching Pt bounced on ball with min assist; attempted partial squats with cues to lean forward with mod assist to lift hips up off ball           PT Short Term Goals - 03/01/16 1611      PT SHORT TERM GOAL #1   Title Pt will consistently perform supine rolling in B directions with min A (with rails as needed) in order to indicate increased trunk dissociation, increased independence with bed mobility.  (Target date for all STGs: 03/29/16)   Time 4   Period Weeks   Status New     PT SHORT TERM GOAL #2   Title Pt will consistently perform supine <> sit with min A to indicate increased independence getting out of bed.    Time 4   Period Weeks   Status New     PT SHORT TERM GOAL #3   Title Pt will demonstrate midline posture in static sitting with BLE support and RUE support at a S level to indicate increased postural awareness/control.     Time 4   Period Weeks   Status New     PT SHORT TERM GOAL #4   Title Pt will consistently perform functional transfers from w/c <> mat table with mod A to decrease caregiver burden.     Time 4   Period Weeks   Status New     PT SHORT TERM GOAL #5   Title Pt will consistently perform sit <> stand from personal w/c with BUE support and mod A to decrease caregiver burden.     Time 4   Period Weeks   Status New     PT SHORT TERM GOAL #6   Title Pt will perform static standing with BUE support x1 minute with supervision while in stedy, no overt LOB to decrease caregiver burden.    Time 4   Period Weeks   Status New            PT Long Term Goals - 03/01/16 1641      PT LONG TERM GOAL #1   Title Pt will perform supine <> sit with supervision 75% of the time to indicate increased independence getting out of bed.   (Target for all LTGs: 04/26/16)   Time 8   Period Weeks   Status New     PT LONG TERM GOAL #2   Title Pt will demonstrate  midline posture in static sitting with BLE support without UE support to indicate increased postural awareness/control.    Time 8   Period Weeks   Status New     PT LONG TERM GOAL #3   Title Pt will consistently perform functional transfers at min A level 75% of the time to decrease caregiver burden.     Time 8   Period Weeks   Status New     PT LONG TERM GOAL #4   Title Pt will perform sit <> stand from personal w/c with BUE support and min A, 75% of time to decrease caregiver burden.     Time 8   Period Weeks   Status New     PT LONG TERM GOAL #5   Title Pt will demonstrate ability to ambulate up to 5' w/ LRAD with max A in order to better assist with getting to/from restroom to decrease burden of care.    Time 8   Period Weeks   Status New               Plan - 03/18/16 1914    Clinical Impression Statement Pt improving significantly with transfers, demonstrating ability to lean anteriorly in preparation for sit to stand; no AFO's worn in gait training, with pt doing well without them.  Pt does need assistance in blocking left knee for increased weight shift onot LLE prior to stepping with RLE; continues to need cues to stand erect to fascilitate hip extension.   Rehab Potential Fair   Clinical Impairments Affecting Rehab Potential Chronicity of condition, continued medical management, but has very strong family and financial support   PT Frequency 2x / week   PT Duration 8 weeks   PT Treatment/Interventions ADLs/Self Care Home Management;DME Instruction;Gait training;Stair training;Functional mobility training;Therapeutic activities;Patient/family  education;Cognitive remediation;Neuromuscular re-education;Therapeutic exercise;Balance training;Orthotic Fit/Training;Wheelchair mobility training;Manual techniques;Splinting;Visual/perceptual remediation/compensation;Vestibular;Passive range of motion;Electrical Stimulation   PT Next Visit Plan continue to work on transfer and gait training and standing balance   Consulted and Agree with Plan of Care Patient;Family member/caregiver   Family Member Consulted caregiver, [name] and husband Judy Spears      Patient will benefit from skilled therapeutic intervention in order to improve the following deficits and impairments:  Abnormal gait, Decreased balance, Decreased endurance, Decreased cognition, Cardiopulmonary status limiting activity, Decreased activity tolerance, Decreased coordination, Decreased strength, Impaired flexibility, Impaired tone, Decreased mobility, Increased muscle spasms, Impaired vision/preception, Decreased range of motion, Impaired UE functional use, Postural dysfunction, Improper body mechanics, Impaired perceived functional ability, Decreased knowledge of precautions, Decreased knowledge of use of DME  Visit Diagnosis: Muscle weakness (generalized)  Other abnormalities of gait and mobility     Problem List Patient Active Problem List   Diagnosis Date Noted  . Heterotopic ossification of bone 03/10/2016  . Acute on chronic respiratory failure with hypoxia ([place])   . Acute hypoxemic respiratory failure ([place])   . Acute blood loss anemia   . Atelectasis 02/04/2016  . Debility 02/03/2016  . Pneumonia of both lower lobes due to Pseudomonas species ([place])   . Pneumonia of both lower lobes due to methicillin susceptible Staphylococcus aureus (MSSA) ([place])   . Acute respiratory failure with hypoxia ([place])   . Acute encephalopathy   . Seizures ([place])   . Sepsis ([place])   . Hypoxia   . Pain   . HCAP (healthcare-associated pneumonia) 12/28/2015  . Chronic respiratory failure ([place])  12/28/2015  . Acute tracheobronchitis 12/24/2015  . Tracheostomy tube present ([place])   .  Tracheal stenosis   . Chronic respiratory failure with hypoxia ([place])   . 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[place]) 10/20/2015  . Tracheostomy status ([place]) 09/26/2015  . Allergic rhinitis 09/26/2015  . 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 Movement disorder 09/22/2015  . Encephalitis 09/22/2015  . Chest pain 02/07/2014  . Premature atrial contractions 01/13/2014  . Abnormality of gait 12/04/2013  . Hyperlipidemia 12/21/2011  . Cardiovascular risk factor 12/21/2011  . Bunion 01/31/2008  . Metatarsalgia of both feet 09/20/2007  . FLAT FOOT 09/20/2007  . HALLUX RIGIDUS, ACQUIRED 09/20/2007    Alda Lea, PT 03/18/2016, 7:26 PM  [facility] [address] [place], [place], [address] Phone: [phone]   Fax:  [phone]  Name: Judy Spears MRN: [id number] Date of Birth: 05/29/1950

## 2016-03-19 ENCOUNTER — Other Ambulatory Visit (HOSPITAL_COMMUNITY): Payer: Self-pay | Admitting: Family Medicine

## 2016-03-19 ENCOUNTER — Ambulatory Visit (HOSPITAL_COMMUNITY)
Admission: RE | Admit: 2016-03-19 | Discharge: 2016-03-19 | Disposition: A | Discharge status: Home / Self Care | Payer: BC Managed Care – PPO | Source: Ambulatory Visit | Attending: Family Medicine | Admitting: Family Medicine

## 2016-03-19 DIAGNOSIS — R1319 Other dysphagia: Secondary | ICD-10-CM | POA: Insufficient documentation

## 2016-03-22 ENCOUNTER — Other Ambulatory Visit: Payer: Self-pay | Admitting: *Deleted

## 2016-03-22 ENCOUNTER — Ambulatory Visit: Payer: BC Managed Care – PPO | Admitting: Occupational Therapy

## 2016-03-22 ENCOUNTER — Encounter: Payer: Self-pay | Admitting: Rehabilitation

## 2016-03-22 ENCOUNTER — Encounter: Payer: Self-pay | Admitting: Occupational Therapy

## 2016-03-22 ENCOUNTER — Ambulatory Visit: Payer: BC Managed Care – PPO | Admitting: Rehabilitation

## 2016-03-22 ENCOUNTER — Ambulatory Visit: Payer: BC Managed Care – PPO | Admitting: Speech Pathology

## 2016-03-22 DIAGNOSIS — R2689 Other abnormalities of gait and mobility: Secondary | ICD-10-CM

## 2016-03-22 DIAGNOSIS — R2681 Unsteadiness on feet: Secondary | ICD-10-CM

## 2016-03-22 DIAGNOSIS — R41844 Frontal lobe and executive function deficit: Secondary | ICD-10-CM

## 2016-03-22 DIAGNOSIS — R41841 Cognitive communication deficit: Secondary | ICD-10-CM

## 2016-03-22 DIAGNOSIS — R293 Abnormal posture: Secondary | ICD-10-CM

## 2016-03-22 DIAGNOSIS — M6281 Muscle weakness (generalized): Secondary | ICD-10-CM

## 2016-03-22 DIAGNOSIS — R471 Dysarthria and anarthria: Secondary | ICD-10-CM

## 2016-03-22 DIAGNOSIS — M25622 Stiffness of left elbow, not elsewhere classified: Secondary | ICD-10-CM

## 2016-03-22 DIAGNOSIS — R29818 Other symptoms and signs involving the nervous system: Secondary | ICD-10-CM

## 2016-03-22 DIAGNOSIS — R1312 Dysphagia, oropharyngeal phase: Secondary | ICD-10-CM

## 2016-03-22 DIAGNOSIS — M25611 Stiffness of right shoulder, not elsewhere classified: Secondary | ICD-10-CM

## 2016-03-22 DIAGNOSIS — M25612 Stiffness of left shoulder, not elsewhere classified: Secondary | ICD-10-CM

## 2016-03-22 MED ORDER — DIAZEPAM 2 MG PO TABS: 2.0000 mg | ORAL_TABLET | Freq: Every day | ORAL | 1 refills | Status: DC

## 2016-03-22 NOTE — Therapy (Signed)
[facility] [address] [place], [place], [address] Phone: [phone]   Fax:  [phone]  Occupational Therapy Treatment  Patient Details  Name: Judy Spears MRN: [id number] Date of Birth: Jul 07, 1950 Referring Provider: Dr. Quita Skye. [name]  Encounter Date: 03/22/2016      OT End of Session - 03/22/16 1246    Visit Number 6   Number of Visits 16   Date for OT Re-Evaluation 04/26/16   Authorization Type [employer] unlimited visits   OT Start Time 0804  pt's husband signed pt in while pt went on to therapy   OT Stop Time 0845   OT Time Calculation (min) 41 min   Activity Tolerance Patient tolerated treatment well      Past Medical History:  Diagnosis Date  . Arthritis    lt great toe  . Complication of anesthesia    pt has had headaches post op-did advise to hydra well preop  . Hair loss    right-sided  . History of exercise stress test    a. ETT (12/15) with 12:00 exercise, no ST changes, occasional PACs.   . Hyperlipidemia   . Insomnia   . Migraine headache   . 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 Postmenopausal   . Premature atrial contractions    a. Holter (12/15) with 8% PACs, no atrial runs or atrial fibrillation.     Past Surgical History:  Procedure Laterality Date  . BREAST BIOPSY  01/01/2011   Procedure: BREAST BIOPSY WITH NEEDLE LOCALIZATION;  Surgeon: Rolm Bookbinder, MD;  Location: [facility];  Service: General;  Laterality: Left;  left breast wire localization biopsy  . cataracts right eye    . CESAREAN SECTION  1986  . HERNIA REPAIR  2000   RIH  . opirectinal membrane peel    . RHINOPLASTY  1983    There were no vitals filed for this visit.      Subjective Assessment - 03/22/16 0809    Subjective  I worked hard the other day   Patient is accompained by: Family member  husband [name] and caregiver [name]   Pertinent History see epic snapshot -  ABI/encephalitis/hypoxia from virus; recent hospitalization due to ARF;    Patient Stated Goals Pt unable to state.    Currently in Pain? Yes  just sore all over because I worked so hard in therapy                       OT Treatments/Exercises (OP) - 03/22/16 0001      Neurological Re-education Exercises   Other Exercises 1 Neuro re ed to address active forward lean in sitting, finding midline, decreasing tone and rigitidity prior to functional mobility, squat pivot transfers (pt at beginning of session required mod a for transfer and only min a at end of session for multi squat pivot transfer).  ALso addressed forward lean in midline as prep for sit to stand, sit to stand with emphasis on midline and pushing both LE"S to achieve improved upper trunk control in extension, static standing in midline with only min a, 1 UE support and max vc's. Pt able to progress to standing with no UE and only min a at left knee for control for up to 10 seconds x3.  Also emphasized active stand to sit.  OT Short Term Goals - 03/22/16 1243      OT SHORT TERM GOAL #1   Title Pt, husband and cargiver will be mod I with HEP - 03/29/2016   Status Achieved     OT SHORT TERM GOAL #2   Title Pt will be able to sit with supervision without UE support and vc's to assist with midline 75% while engage in functional task   Status Achieved     OT SHORT TERM GOAL #3   Title Pt will be able to complete sit to stand consistently with mod A for toilet transfers (max a for pivot)   Status On-going     OT SHORT TERM GOAL #4   Title Pt, husband and caregiver will verbalize understanding of various bathroom accomodations to reduce long term caregiver burden           OT Long Term Goals - 03/22/16 1244      OT LONG TERM GOAL #1   Title Pt, husband and caregiver will be mod I with upgraded HEP prn - 05/26/2016   Status On-going     OT LONG TERM GOAL #2   Title Pt, husband and  caregiver will be mod I with splint wear and care    Status On-going     OT LONG TERM GOAL #3   Title Pt will be able to wash face with min a only   Status On-going     OT LONG TERM GOAL #4   Title Pt will be able to sit without UE support with supevision 100% of the time with no more than 2 vc's while engaged in functional activity.   Status On-going     OT LONG TERM GOAL #5   Title Pt, husband and caregiver will verbalize understanding of potential after care therapeutic opportunities.    Status On-going               Plan - 03/22/16 1244    Clinical Impression Statement Pt making excellent progress toward goals. Pt has made signficant gains over the past two weeks in particular related to attention, ability to follow direction, postural control, finding midline and functional mobility   Rehab Potential Fair   Clinical Impairments Affecting Rehab Potential severity of deficits, very slow rate of recovery   OT Frequency 2x / week   OT Duration 8 weeks   OT Treatment/Interventions Self-care/ADL training;Ultrasound;Moist Heat;Electrical Stimulation;DME and/or AE instruction;Neuromuscular education;Therapeutic exercise;Functional Mobility Training;Manual Therapy;Passive range of motion;Splinting;Therapeutic activities;Balance training;Patient/family education;Visual/perceptual remediation/compensation;Cognitive remediation/compensation   Plan fabricate right resting hand splint, monitor 3rd digit PIP flexion.    Consulted and Agree with Plan of Care Patient;Family member/caregiver   Family Member Consulted caregiver [name], husband [name]       Patient will benefit from skilled therapeutic intervention in order to improve the following deficits and impairments:  Decreased endurance, Decreased skin integrity, Impaired vision/preception, Improper body mechanics, Impaired perceived functional ability, Decreased knowledge of precautions, Cardiopulmonary status limiting activity, Decreased  activity tolerance, Decreased knowledge of use of DME, Decreased strength, Impaired flexibility, Improper spinal/pelvic alignment, Impaired sensation, Difficulty walking, Decreased mobility, Decreased balance, Decreased cognition, Decreased range of motion, Impaired tone, Impaired UE functional use, Decreased safety awareness, Decreased coordination, Pain  Visit Diagnosis: Muscle weakness (generalized)  Unsteadiness on feet  Abnormal posture  Other symptoms and signs involving the nervous system  Stiffness of left shoulder, not elsewhere classified  Stiffness of right shoulder, not elsewhere classified  Stiffness of left elbow, not elsewhere classified  Frontal lobe and executive function deficit    Problem List Patient Active Problem List   Diagnosis Date Noted  . Heterotopic ossification of bone 03/10/2016  . Acute on chronic respiratory failure with hypoxia ([facility])   . Acute hypoxemic respiratory failure ([facility])   . Acute blood loss anemia   . Atelectasis 02/04/2016  . Debility 02/03/2016  . Pneumonia of both lower lobes due to Pseudomonas species ([facility])   . Pneumonia of both lower lobes due to methicillin susceptible Staphylococcus aureus (MSSA) ([facility])   . Acute respiratory failure with hypoxia ([facility])   . Acute encephalopathy   . Seizures ([place])   . Sepsis ([facility])   . Hypoxia   . Pain   . HCAP (healthcare-associated pneumonia) 12/28/2015  . Chronic respiratory failure ([facility]) 12/28/2015  . Acute tracheobronchitis 12/24/2015  . Tracheostomy tube present ([facility])   . Tracheal stenosis   . Chronic respiratory failure with hypoxia ([facility])   . 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 Tracheostomy status ([facility]) 09/26/2015  . Allergic rhinitis 09/26/2015  . 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 Movement disorder 09/22/2015  . Encephalitis 09/22/2015  . Chest pain 02/07/2014  . Premature atrial contractions 01/13/2014  . Abnormality of gait 12/04/2013  . Hyperlipidemia 12/21/2011  .  Cardiovascular risk factor 12/21/2011  . Bunion 01/31/2008  . Metatarsalgia of both feet 09/20/2007  . FLAT FOOT 09/20/2007  . HALLUX RIGIDUS, ACQUIRED 09/20/2007    Quay Burow, OTR/L 03/22/2016, 12:48 PM  [facility] [address] [place], [place], [address] Phone: [phone]   Fax:  [phone]  Name: Judy Spears MRN: [id number] Date of Birth: [birth year]

## 2016-03-22 NOTE — Therapy (Signed)
[facility] [address] [place], [place], [address] Phone: [phone]   Fax:  [phone]  Speech Language Pathology Treatment  Patient Details  Name: Judy Spears MRN: [id number] Date of Birth: 08/04/1950 Referring Provider: Charlett Blake, MD  Encounter Date: 03/22/2016      End of Session - 03/22/16 1301    Visit Number 7   Number of Visits 17   Date for SLP Re-Evaluation 04/30/16   SLP Start Time 0847   SLP Stop Time  0929   SLP Time Calculation (min) 42 min   Activity Tolerance Patient tolerated treatment well      Past Medical History:  Diagnosis Date  . Arthritis    lt great toe  . Complication of anesthesia    pt has had headaches post op-did advise to hydra well preop  . Hair loss    right-sided  . History of exercise stress test    a. ETT (12/15) with 12:00 exercise, no ST changes, occasional PACs.   . Hyperlipidemia   . Insomnia   . Migraine headache   . 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 Postmenopausal   . Premature atrial contractions    a. Holter (12/15) with 8% PACs, no atrial runs or atrial fibrillation.     Past Surgical History:  Procedure Laterality Date  . BREAST BIOPSY  01/01/2011   Procedure: BREAST BIOPSY WITH NEEDLE LOCALIZATION;  Surgeon: Rolm Bookbinder, MD;  Location: [facility];  Service: General;  Laterality: Left;  left breast wire localization biopsy  . cataracts right eye    . CESAREAN SECTION  1986  . HERNIA REPAIR  2000   RIH  . opirectinal membrane peel    . RHINOPLASTY  1983    There were no vitals filed for this visit.      Subjective Assessment - 03/22/16 1239    Subjective Pt's husband reports trying pureed foods at home.   Patient is accompained by: Family member   Currently in Pain? No/denies               ADULT SLP TREATMENT - 03/22/16 0001      General Information   Behavior/Cognition  Alert;Cooperative;Pleasant mood;Distractible   Patient Positioning Upright in chair   Oral care provided N/A  Recently completed by husband     Treatment Provided   Treatment provided Dysphagia;Cognitive-Linquistic     Dysphagia Treatment   Temperature Spikes Noted No   Respiratory Status Room air   Oral Cavity - Dentition Adequate natural dentition   Treatment Methods Skilled observation;Upgraded PO texture trial;Therapeutic exercise;Compensation strategy training;Patient/caregiver education   Patient observed directly with PO's Yes   Type of PO's observed Dysphagia 1 (puree);Thin liquids   Feeding Needs assist;Total assist   Liquids provided via Teaspoon;Cup   Oral Phase Signs & Symptoms Anterior loss/spillage   Pharyngeal Phase Signs & Symptoms Suspected delayed swallow initiation  delay x1 with puree, suspect due to eructation mid-swallow   Type of cueing Verbal   Amount of cueing Moderate   Other treatment/comments Provided education regarding results of recent MBS and recommendation to gradually introduce pleasure feeds of small amounts of liquids and pureed textures as tolerated. Patient's husband states they have been trialing foods at home. Patient vomited after pureed black bean soup, husband states, "We might have overdone it." Stated pt tolerated in subsequent days small amounts (6 to 10 teaspoons) of smoothie and pureed fruits. Provided PO  trials of thin liquids via cup sip with assistance. Anterior spillage ~50% of trials, swallow appears timely with all trials, pt required verbal cue for subsequent dry swallow. Provided education on purpose for dry swallow to clear mild residue as seen on MBS. Limited trials of pureed applesauce as patient dislikes the taste. Suspected swallow delay x1 as pt with eructation mid-swallow. Pt with spontaneous throat clear/reswallow and clear voice. Husband and caregiver will bring purees, soft foods from home in next session.      Pain Assessment    Pain Assessment No/denies pain     Cognitive-Linquistic Treatment   Treatment focused on Voice;Dysarthria;Patient/family/caregiver education   Skilled Treatment SLP targeted AAC as well as timing of breathing/voicing to clarify verbal messages patient attempting to deliver. With max cues for slowing rate, visual graphic of breath/speech timing at single word level, patient delivered two 8-10 word sentence-level messages. Whiteboard was utilized to confirm communication partner's understanding. Patient required max cues but benefitted from written visual to facilitate sustained attention to relay complete message.     Tracheostomy Shiley 4 mm Uncuffed   Tracheostomy Properties Placement Date: 02/24/16 Placement Time: 0927 Placed By: ICU physician Brand: Shiley Size (mm): 4 mm Style: Uncuffed   Status Capped;Secured   Site Assessment Clean;Dry   Ties Assessment Clean;Dry;Secure   Cuff pressure (cm) 0 cm     Assessment / Recommendations / Plan   Plan Continue with current plan of care;Goals updated     Dysphagia Recommendations   Diet recommendations Dysphagia 1 (puree);Thin liquid   Liquids provided via Teaspoon;Cup   Medication Administration Via alternative means   Supervision Full supervision/cueing for compensatory strategies;Trained caregiver to feed patient   Compensations Effortful swallow;Multiple dry swallows after each bite/sip;Check for anterior loss;Small sips/bites;Slow rate   Postural Changes and/or Swallow Maneuvers Seated upright 90 degrees     General Recommendations   Oral Care Recommendations Oral care before and after PO;Staff/trained caregiver to provide oral care     Progression Toward Goals   Progression toward goals Progressing toward goals          SLP Education - 03/22/16 1300    Education provided Yes   Education Details Signs/symptoms of aspiration, monitoring level of alertness, fatigue during feeding, slow introduction of solid foods   Person(s)  Educated Patient;Spouse;Caregiver(s)   Methods Explanation;Demonstration   Comprehension Verbalized understanding;Returned demonstration;Need further instruction          SLP Short Term Goals - 03/22/16 1308      SLP SHORT TERM GOAL #1   Title Patient will utilize adequate breath support, slow rate and visual cues from communication partner/AAC to communicate wants, needs, thoughts and ideas.   Time 4   Period Weeks   Status Revised     SLP SHORT TERM GOAL #2   Title Patient will utilize intelligibility strategies, breath support with mod A for phrase repetition in 75% of trials.   Baseline 2.12   Time 2   Period Weeks   Status On-going     SLP SHORT TERM GOAL #3   Title Pt will swallow thin liquids, purees and soft solids in 9/10 trials with no overt signs of aspiration.   Time 4   Period Weeks   Status Revised          SLP Long Term Goals - 03/22/16 1313      SLP LONG TERM GOAL #1   Title Patient will utilize multimodal communication/AAC to clarify wants, needs, thoughts or ideas during communication  breakdowns with max A   Time 5   Period Weeks   Status On-going     SLP LONG TERM GOAL #2   Title Patient will utilize intelligibility strategies, breath support with min A for 5 word functional phrases with 80% accuracy.   Time 5   Period Weeks   Status On-going     SLP LONG TERM GOAL #3   Title Patient will demonstrate readiness for MBS to determine safety for pleasure feeds by timely initiation of swallow in 3/5 trials with ice chips/H20.   Status Achieved     SLP LONG TERM GOAL #4   Title Patient will tolerate pleasure feeds of 8 oz thin liquids, purees or soft solids.   Time 5   Period Weeks   Status New          Plan - 03/22/16 1302    Clinical Impression Statement MBS showed adequate airway protection for range of textures from thin liquids to soft solids. Patient continues to have fluctuations in attention and alertness which present increased risk  for aspiration. She will benefit from gradual introduction of solids, oropharyngeal strengthening to target physical deficits, training in compensatory techniques and caregiver education regarding signs of aspiration and feeding strategies to reduce risks and improve swallowing function. Recommend skilled ST  in order to maximize functional communication, swallowing safety, reduce caregiver burden and improve quality of life.   Speech Therapy Frequency 2x / week   Duration Other (comment)   Treatment/Interventions Aspiration precaution training;Trials of upgraded texture/liquids;Compensatory strategies;Functional tasks;Patient/family education;Cueing hierarchy;Diet toleration management by SLP;Cognitive reorganization;Compensatory techniques;SLP instruction and feedback;Internal/external aids;Multimodal communcation approach   Potential to Achieve Goals Good   Potential Considerations Ability to learn/carryover information;Co-morbidities;Severity of impairments   SLP Home Exercise Plan Upgraded PO comfort feeds of pureed solids, thin liquids as tolerated when alert and without overt signs of aspiraiton, use of whiteboard to confirm message   Consulted and Agree with Plan of Care Family member/caregiver;Patient   Family Member Consulted caregiver [name], husband [name]      Patient will benefit from skilled therapeutic intervention in order to improve the following deficits and impairments:   Dysarthria and anarthria  Dysphagia, oropharyngeal phase  Cognitive communication deficit    Problem List Patient Active Problem List   Diagnosis Date Noted  . Heterotopic ossification of bone 03/10/2016  . Acute on chronic respiratory failure with hypoxia ([facility])   . Acute hypoxemic respiratory failure ([facility])   . Acute blood loss anemia   . Atelectasis 02/04/2016  . Debility 02/03/2016  . Pneumonia of both lower lobes due to Pseudomonas species ([facility])   . Pneumonia of both lower lobes due to methicillin  susceptible Staphylococcus aureus (MSSA) ([facility])   . Acute respiratory failure with hypoxia ([facility])   . Acute encephalopathy   . Seizures ([facility])   . Sepsis ([facility])   . Hypoxia   . Pain   . HCAP (healthcare-associated pneumonia) 12/28/2015  . Chronic respiratory failure ([facility]) 12/28/2015  . Acute tracheobronchitis 12/24/2015  . Tracheostomy tube present ([facility] [facility])   . Tracheal stenosis   . Chronic respiratory failure with hypoxia ([facility] [place])   . 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[place]) 10/20/2015  . Tracheostomy status ([facility]) 09/26/2015  . Allergic rhinitis 09/26/2015  . 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 Movement disorder 09/22/2015  . Encephalitis 09/22/2015  . Chest pain 02/07/2014  . Premature atrial contractions 01/13/2014  . Abnormality of gait 12/04/2013  . Hyperlipidemia 12/21/2011  . Cardiovascular risk factor 12/21/2011  . Bunion 01/31/2008  .  Metatarsalgia of both feet 09/20/2007  . FLAT FOOT 09/20/2007  . HALLUX RIGIDUS, ACQUIRED 09/20/2007   Deneise Lever, [place] CF-SLP Speech-Language Pathologist  Aliene Altes 03/22/2016, 1:15 PM  [facility] [address] [place], [place], [address] Phone: [phone]   Fax:  [phone]   Name: DANAJHA WIEHE MRN: [id number] Date of Birth: 04-19-1950

## 2016-03-22 NOTE — Therapy (Signed)
[facility] [address] [place], [place], [address] Phone: [phone]   Fax:  [phone]  Physical Therapy Treatment  Patient Details  Name: Judy Spears MRN: [id number] Date of Birth: [birth year] Referring Provider: Alger Simons, MD/Andrew Letta Pate, MD  Encounter Date: 03/22/2016      PT End of Session - 03/22/16 1641    Visit Number 7   Number of Visits 20   Date for PT Re-Evaluation 04/30/16   Authorization Type BCBS   PT Start Time [id number]   PT Stop Time 1020   PT Time Calculation (min) 49 min   Activity Tolerance Patient tolerated treatment well   Behavior During Therapy [facility] for tasks assessed/performed      Past Medical History:  Diagnosis Date  . Arthritis    lt great toe  . Complication of anesthesia    pt has had headaches post op-did advise to hydra well preop  . Hair loss    right-sided  . History of exercise stress test    a. ETT (12/15) with 12:00 exercise, no ST changes, occasional PACs.   . Hyperlipidemia   . Insomnia   . Migraine headache   . 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 Postmenopausal   . Premature atrial contractions    a. Holter (12/15) with 8% PACs, no atrial runs or atrial fibrillation.     Past Surgical History:  Procedure Laterality Date  . BREAST BIOPSY  01/01/2011   Procedure: BREAST BIOPSY WITH NEEDLE LOCALIZATION;  Surgeon: Rolm Bookbinder, MD;  Location: [facility];  Service: General;  Laterality: Left;  left breast wire localization biopsy  . cataracts right eye    . CESAREAN SECTION  1986  . HERNIA REPAIR  2000   RIH  . opirectinal membrane peel    . RHINOPLASTY  1983    There were no vitals filed for this visit.      Subjective Assessment - 03/22/16 1639    Subjective Husband and Caregiver reporting pt passed her modified swallowing test over the weekend.     Patient is accompained by: Family member   Pertinent History  Precautions:  PEG.  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  Another round of botox in LLE on 02/06/16   Limitations House hold activities;Walking;Standing;Sitting   Patient Stated Goals Per husband, "For [name] to be as independent as possible."   Currently in Pain? No/denies             NMR:  Worked in // bars today with +2 A (from [employer], OT) with emphasis on sit<>stand transitional movements, postural control in standing, finding and maintaining midline posture, improving LLE activation in stance with RLE stepping forward/backwards and up to small 2" step.  Note marked difficulty with stepping to target.  Also note difficulty elevating RLE without first increasing L lateral flexion.  Had pt work as much as possible without UE support because at times she tends to "pull" body towards the L.  Also performed gait with +2A.  Attempted today with PT placed anteriorly to pt with BUEs on PTs shoulders while OT provided assist at L LE for improved activation during stance, improved L hip protraction and forward movement over LLE.  Continued facilitation for upright posture throughout.  Tolerated well.  Ended session  with assessment of transitional movements supine<>sit.  Pt does well getting to supine and rolling R and L at min A level (S for rolling), however once in R SL, requires max A for trunk to elevate to EOB due to strong posterior resistance.  Will address in future sessions.                       PT Short Term Goals - 03/01/16 1611      PT SHORT TERM GOAL #1   Title Pt will consistently perform supine rolling in B directions with min A (with rails as needed) in order to indicate increased trunk dissociation, increased independence with bed mobility.  (Target date for all STGs:  03/29/16)   Time 4   Period Weeks   Status New     PT SHORT TERM GOAL #2   Title Pt will consistently perform supine <> sit with min A to indicate increased independence getting out of bed.    Time 4   Period Weeks   Status New     PT SHORT TERM GOAL #3   Title Pt will demonstrate midline posture in static sitting with BLE support and RUE support at a S level to indicate increased postural awareness/control.     Time 4   Period Weeks   Status New     PT SHORT TERM GOAL #4   Title Pt will consistently perform functional transfers from w/c <> mat table with mod A to decrease caregiver burden.     Time 4   Period Weeks   Status New     PT SHORT TERM GOAL #5   Title Pt will consistently perform sit <> stand from personal w/c with BUE support and mod A to decrease caregiver burden.     Time 4   Period Weeks   Status New     PT SHORT TERM GOAL #6   Title Pt will perform static standing with BUE support x1 minute with supervision while in stedy, no overt LOB to decrease caregiver burden.    Time 4   Period Weeks   Status New           PT Long Term Goals - 03/01/16 1641      PT LONG TERM GOAL #1   Title Pt will perform supine <> sit with supervision 75% of the time to indicate increased independence getting out of bed.   (Target for all LTGs: 04/26/16)   Time 8   Period Weeks   Status New     PT LONG TERM GOAL #2   Title Pt will demonstrate midline posture in static sitting with BLE support without UE support to indicate increased postural awareness/control.    Time 8   Period Weeks   Status New     PT LONG TERM GOAL #3   Title Pt will consistently perform functional transfers at min A level 75% of the time to decrease caregiver burden.     Time 8   Period Weeks   Status New     PT LONG TERM GOAL #4   Title Pt will perform sit <> stand from personal w/c with BUE support and min A, 75% of time to decrease caregiver burden.     Time 8   Period Weeks   Status New      PT LONG TERM GOAL #5   Title Pt will demonstrate ability to ambulate up to 5' w/ LRAD with max  A in order to better assist with getting to/from restroom to decrease burden of care.    Time 8   Period Weeks   Status New               Plan - 03/22/16 1641    Clinical Impression Statement Pt continues to demonstrate improved postural control in standing, ability to find midline, and maintain for several seconds.  Note continued difficulty maintaining upright posture and still needs max cues and facilitation for improved L lateral weight shift and trunk/head tilt to the R.     Rehab Potential Fair   Clinical Impairments Affecting Rehab Potential Chronicity of condition, continued medical management, but has very strong family and financial support   PT Frequency 2x / week   PT Duration 8 weeks   PT Treatment/Interventions ADLs/Self Care Home Management;DME Instruction;Gait training;Stair training;Functional mobility training;Therapeutic activities;Patient/family education;Cognitive remediation;Neuromuscular re-education;Therapeutic exercise;Balance training;Orthotic Fit/Training;Wheelchair mobility training;Manual techniques;Splinting;Visual/perceptual remediation/compensation;Vestibular;Passive range of motion;Electrical Stimulation   PT Next Visit Plan I think we need to go back to some bed mobility esp SL>sit maintaining forward weight shift, continue to work on transfer and gait training and standing balance   Consulted and Agree with Plan of Care Patient;Family member/caregiver   Family Member Consulted caregiver, [name] and husband [name]      Patient will benefit from skilled therapeutic intervention in order to improve the following deficits and impairments:  Abnormal gait, Decreased balance, Decreased endurance, Decreased cognition, Cardiopulmonary status limiting activity, Decreased activity tolerance, Decreased coordination, Decreased strength, Impaired flexibility, Impaired tone,  Decreased mobility, Increased muscle spasms, Impaired vision/preception, Decreased range of motion, Impaired UE functional use, Postural dysfunction, Improper body mechanics, Impaired perceived functional ability, Decreased knowledge of precautions, Decreased knowledge of use of DME  Visit Diagnosis: Unsteadiness on feet  Abnormal posture  Muscle weakness (generalized)  Other symptoms and signs involving the nervous system  Other abnormalities of gait and mobility     Problem List Patient Active Problem List   Diagnosis Date Noted  . Heterotopic ossification of bone 03/10/2016  . Acute on chronic respiratory failure with hypoxia ([place])   . Acute hypoxemic respiratory failure ([place])   . Acute blood loss anemia   . Atelectasis 02/04/2016  . Debility 02/03/2016  . Pneumonia of both lower lobes due to Pseudomonas species ([place])   . Pneumonia of both lower lobes due to methicillin susceptible Staphylococcus aureus (MSSA) ([place])   . Acute respiratory failure with hypoxia ([place])   . Acute encephalopathy   . Seizures ([place])   . Sepsis ([place])   . Hypoxia   . Pain   . HCAP (healthcare-associated pneumonia) 12/28/2015  . Chronic respiratory failure ([place]) 12/28/2015  . Acute tracheobronchitis 12/24/2015  . Tracheostomy tube present ([place])   . Tracheal stenosis   . Chronic respiratory failure with hypoxia ([place])   . 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[place]) 10/20/2015  . Tracheostomy status ([place]) 09/26/2015  . Allergic rhinitis 09/26/2015  . 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 Movement disorder 09/22/2015  . Encephalitis 09/22/2015  . Chest pain 02/07/2014  . Premature atrial contractions 01/13/2014  . Abnormality of gait 12/04/2013  . Hyperlipidemia 12/21/2011  . Cardiovascular risk factor 12/21/2011  . Bunion 01/31/2008  . Metatarsalgia of both feet 09/20/2007  . FLAT FOOT 09/20/2007  . HALLUX RIGIDUS, ACQUIRED 09/20/2007    Cameron Sprang, PT, MPT [facility] [address] [place], [place], [address] Phone: [phone]   Fax:  [phone] 03/22/16, 7:41 PM  Name: Judy Gowda  Spears MRN: [id number] Date of Birth: 27-Oct-1950

## 2016-03-26 ENCOUNTER — Ambulatory Visit: Payer: BC Managed Care – PPO | Attending: Physical Medicine & Rehabilitation | Admitting: Rehabilitation

## 2016-03-26 ENCOUNTER — Ambulatory Visit: Payer: BC Managed Care – PPO

## 2016-03-26 ENCOUNTER — Encounter: Payer: Self-pay | Admitting: Rehabilitation

## 2016-03-26 ENCOUNTER — Encounter: Payer: Self-pay | Admitting: Occupational Therapy

## 2016-03-26 ENCOUNTER — Ambulatory Visit: Payer: BC Managed Care – PPO | Admitting: Occupational Therapy

## 2016-03-26 DIAGNOSIS — R29818 Other symptoms and signs involving the nervous system: Secondary | ICD-10-CM

## 2016-03-26 DIAGNOSIS — R41841 Cognitive communication deficit: Secondary | ICD-10-CM | POA: Insufficient documentation

## 2016-03-26 DIAGNOSIS — M25611 Stiffness of right shoulder, not elsewhere classified: Secondary | ICD-10-CM | POA: Diagnosis present

## 2016-03-26 DIAGNOSIS — M25622 Stiffness of left elbow, not elsewhere classified: Secondary | ICD-10-CM | POA: Insufficient documentation

## 2016-03-26 DIAGNOSIS — R278 Other lack of coordination: Secondary | ICD-10-CM

## 2016-03-26 DIAGNOSIS — R471 Dysarthria and anarthria: Secondary | ICD-10-CM | POA: Diagnosis present

## 2016-03-26 DIAGNOSIS — R41844 Frontal lobe and executive function deficit: Secondary | ICD-10-CM | POA: Diagnosis present

## 2016-03-26 DIAGNOSIS — M25612 Stiffness of left shoulder, not elsewhere classified: Secondary | ICD-10-CM | POA: Diagnosis present

## 2016-03-26 DIAGNOSIS — M6281 Muscle weakness (generalized): Secondary | ICD-10-CM | POA: Insufficient documentation

## 2016-03-26 DIAGNOSIS — R293 Abnormal posture: Secondary | ICD-10-CM

## 2016-03-26 DIAGNOSIS — R1312 Dysphagia, oropharyngeal phase: Secondary | ICD-10-CM | POA: Insufficient documentation

## 2016-03-26 DIAGNOSIS — R2689 Other abnormalities of gait and mobility: Secondary | ICD-10-CM | POA: Insufficient documentation

## 2016-03-26 DIAGNOSIS — R2681 Unsteadiness on feet: Secondary | ICD-10-CM | POA: Insufficient documentation

## 2016-03-26 NOTE — Therapy (Signed)
[facility] [address] [place], [place], [address] Phone: [phone]   Fax:  [phone]  Occupational Therapy Treatment  Patient Details  Name: Judy Spears MRN: [id number] Date of Birth: 1950/09/07 Referring Provider: Dr. Quita Skye. [name]  Encounter Date: 03/26/2016      OT End of Session - 03/26/16 1657    Visit Number 7   Number of Visits 16   Date for OT Re-Evaluation 04/26/16   Authorization Type BCBS unlimited visits   OT Start Time [id number]   OT Stop Time 1615   OT Time Calculation (min) 45 min   Activity Tolerance Patient tolerated treatment well   Behavior During Therapy [facility] for tasks assessed/performed      Past Medical History:  Diagnosis Date  . Arthritis    lt great toe  . Complication of anesthesia    pt has had headaches post op-did advise to hydra well preop  . Hair loss    right-sided  . History of exercise stress test    a. ETT (12/15) with 12:00 exercise, no ST changes, occasional PACs.   . Hyperlipidemia   . Insomnia   . Migraine headache   . 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 Postmenopausal   . Premature atrial contractions    a. Holter (12/15) with 8% PACs, no atrial runs or atrial fibrillation.     Past Surgical History:  Procedure Laterality Date  . BREAST BIOPSY  01/01/2011   Procedure: BREAST BIOPSY WITH NEEDLE LOCALIZATION;  Surgeon: Rolm Bookbinder, MD;  Location: [facility];  Service: General;  Laterality: Left;  left breast wire localization biopsy  . cataracts right eye    . CESAREAN SECTION  1986  . HERNIA REPAIR  2000   RIH  . opirectinal membrane peel    . RHINOPLASTY  1983    There were no vitals filed for this visit.      Subjective Assessment - 03/26/16 1653    Subjective  I'm good   Patient is accompained by: Family member   Pertinent History see epic snapshot - ABI/encephalitis/hypoxia from virus; recent hospitalization due  to ARF;    Patient Stated Goals Pt unable to state.    Currently in Pain? No/denies   Pain Score 0-No pain                      OT Treatments/Exercises (OP) - 03/26/16 0001      Neurological Re-education Exercises   Other Exercises 1 Neuromuscular reeducation to address postural control and alignment for static to dynamic standing.  Patient with improved ability to extend left hip and knee, and able to balance weight between two feet in standing.   Patient with significantr left trunk lateral shortening, and lateral flexion in left neck- once head/neck trunk realigned over base of support- actively- patient able to stand with only intermittent assistance.  Today able to stand and hold large beach ball - relying on lower extremities and trunk (vs arms) for balance - rotate and place ball in chair.                    OT Short Term Goals - 03/26/16 1659      OT SHORT TERM GOAL #1   Title Pt, husband and cargiver will be mod I with HEP - 03/29/2016   Status Achieved     OT SHORT TERM GOAL #2   Title Pt  will be able to sit with supervision without UE support and vc's to assist with midline 75% while engage in functional task   Status Achieved     OT [place] #3   Title Pt will be able to complete sit to stand consistently with mod A for toilet transfers (max a for pivot)   Status Achieved     OT SHORT TERM GOAL #4   Title Pt, husband and caregiver will verbalize understanding of various bathroom accomodations to reduce long term caregiver burden   Status On-going           OT Long Term Goals - 03/22/16 1244      OT LONG TERM GOAL #1   Title Pt, husband and caregiver will be mod I with upgraded HEP prn - 05/26/2016   Status On-going     OT LONG TERM GOAL #2   Title Pt, husband and caregiver will be mod I with splint wear and care    Status On-going     OT LONG TERM GOAL #3   Title Pt will be able to wash face with min a only   Status On-going      OT LONG TERM GOAL #4   Title Pt will be able to sit without UE support with supevision 100% of the time with no more than 2 vc's while engaged in functional activity.   Status On-going     OT LONG TERM GOAL #5   Title Pt, husband and caregiver will verbalize understanding of potential after care therapeutic opportunities.    Status On-going               Plan - 03/26/16 1657    Clinical Impression Statement Patient is making steady improvement with functional mobility, as alignment and postural control improve.  Family reports patient with increased desire to move at home.     Rehab Potential Fair   Clinical Impairments Affecting Rehab Potential severity of deficits, very slow rate of recovery   OT Frequency 2x / week   OT Duration 8 weeks   OT Treatment/Interventions Self-care/ADL training;Ultrasound;Moist Heat;Electrical Stimulation;DME and/or AE instruction;Neuromuscular education;Therapeutic exercise;Functional Mobility Training;Manual Therapy;Passive range of motion;Splinting;Therapeutic activities;Balance training;Patient/family education;Visual/perceptual remediation/compensation;Cognitive remediation/compensation   Plan NMR for static to dynamic standing baalnce, right hand splint   OT Home Exercise Plan Reviewed plan for home management for left elbow range of motion - JAS 3x/day FOR 30 min./time, and locked elbow extesnion brace at night   Consulted and Agree with Plan of Care Patient;Family member/caregiver   Family Member Consulted caregiver [name], husband [name]       Patient will benefit from skilled therapeutic intervention in order to improve the following deficits and impairments:  Decreased endurance, Decreased skin integrity, Impaired vision/preception, Improper body mechanics, Impaired perceived functional ability, Decreased knowledge of precautions, Cardiopulmonary status limiting activity, Decreased activity tolerance, Decreased knowledge of use of DME, Decreased  strength, Impaired flexibility, Improper spinal/pelvic alignment, Impaired sensation, Difficulty walking, Decreased mobility, Decreased balance, Decreased cognition, Decreased range of motion, Impaired tone, Impaired UE functional use, Decreased safety awareness, Decreased coordination, Pain  Visit Diagnosis: Muscle weakness (generalized)  Unsteadiness on feet  Abnormal posture  Other symptoms and signs involving the nervous system  Stiffness of left shoulder, not elsewhere classified  Stiffness of right shoulder, not elsewhere classified  Stiffness of left elbow, not elsewhere classified  Frontal lobe and executive function deficit  Other lack of coordination    Problem List Patient Active Problem List  Diagnosis Date Noted  . Heterotopic ossification of bone 03/10/2016  . Acute on chronic respiratory failure with hypoxia ([place])   . Acute hypoxemic respiratory failure ([place])   . Acute blood loss anemia   . Atelectasis 02/04/2016  . Debility 02/03/2016  . Pneumonia of both lower lobes due to Pseudomonas species ([place])   . Pneumonia of both lower lobes due to methicillin susceptible Staphylococcus aureus (MSSA) ([place])   . Acute respiratory failure with hypoxia ([place])   . Acute encephalopathy   . Seizures ([place])   . Sepsis ([place])   . Hypoxia   . Pain   . HCAP (healthcare-associated pneumonia) 12/28/2015  . Chronic respiratory failure ([place]) 12/28/2015  . Acute tracheobronchitis 12/24/2015  . Tracheostomy tube present ([place])   . Tracheal stenosis   . Chronic respiratory failure with hypoxia ([place])   . 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[place]) 10/20/2015  . Tracheostomy status ([place]) 09/26/2015  . Allergic rhinitis 09/26/2015  . 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 Movement disorder 09/22/2015  . Encephalitis 09/22/2015  . Chest pain 02/07/2014  . Premature atrial contractions 01/13/2014  . Abnormality of gait 12/04/2013  . Hyperlipidemia 12/21/2011  . Cardiovascular risk factor 12/21/2011  .  Bunion 01/31/2008  . Metatarsalgia of both feet 09/20/2007  . FLAT FOOT 09/20/2007  . HALLUX RIGIDUS, ACQUIRED 09/20/2007    Mariah Milling, OTR/L 03/26/2016, 5:00 PM  [facility] [address] [place], [place], [address] Phone: [phone]   Fax:  [phone]  Name: Judy Spears MRN: [id number] Date of Birth: 1950/03/24

## 2016-03-26 NOTE — Therapy (Signed)
[facility] [address] [place], [place], [address] Phone: [phone]   Fax:  [phone]  Physical Therapy Treatment  Patient Details  Name: Judy Spears MRN: [id number] Date of Birth: Jul 27, 1950 Referring Provider: Alger Simons, MD/Andrew Letta Pate, MD  Encounter Date: 03/26/2016      PT End of Session - 03/26/16 1723    Visit Number 8   Number of Visits 20   Date for PT Re-Evaluation 04/30/16   Authorization Type [employer]   PT Start Time 1402   PT Stop Time 1445   PT Time Calculation (min) 43 min   Activity Tolerance Patient tolerated treatment well   Behavior During Therapy [facility] for tasks assessed/performed      Past Medical History:  Diagnosis Date  . Arthritis    lt great toe  . Complication of anesthesia    pt has had headaches post op-did advise to hydra well preop  . Hair loss    right-sided  . History of exercise stress test    a. ETT (12/15) with 12:00 exercise, no ST changes, occasional PACs.   . Hyperlipidemia   . Insomnia   . Migraine headache   . 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 Postmenopausal   . Premature atrial contractions    a. Holter (12/15) with 8% PACs, no atrial runs or atrial fibrillation.     Past Surgical History:  Procedure Laterality Date  . BREAST BIOPSY  01/01/2011   Procedure: BREAST BIOPSY WITH NEEDLE LOCALIZATION;  Surgeon: Rolm Bookbinder, MD;  Location: [facility];  Service: General;  Laterality: Left;  left breast wire localization biopsy  . cataracts right eye    . CESAREAN SECTION  1986  . HERNIA REPAIR  2000   RIH  . opirectinal membrane peel    . RHINOPLASTY  1983    There were no vitals filed for this visit.      Subjective Assessment - 03/26/16 1723    Subjective Pts caregiver states she has been walking at home with [name] and other caregiver and also with husband.     Patient is accompained by: Family member   Pertinent  History Precautions:  PEG.  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  Another round of botox in LLE on 02/06/16   Limitations House hold activities;Walking;Standing;Sitting   Patient Stated Goals Per husband, "For [name] to be as independent as possible."   Currently in Pain? No/denies          NMR:  Session worked on [employer] with sit<>stand transitional movements, functional stand pivot transfers, postural control in standing, finding and maintaining midline posture, improving LLE activation in stance with RLE stepping forward/backwards/lifting off of ground and returning.  Note that we got into supine initially working on coming onto R elbow and into supine.  She tolerated this well with mod A.  She was also able to roll side to side with min/guard A with cues for reaching across midline.  Note that once on R side again, she seemed to be regurgitating, but in no distress.  PT with assist quickly assisted pt back into sitting to avoid emesis.  Pts caregiver states she had water but it had been approx 2 hours ago.  Due to this, we avoided being supine during session.  Performed  several sit<>stand with varying amounts of assist from min to mod A with continued cues for forward weight shift (reaching hands down on legs) and also cues for R lateral trunk lean and head tilt.  Once in standing, worked on maintaining balance with PT/assist working to reduce amount of physical assist as this tends to cause pt too much proprioceptive feedback to "push" against.  Pt requires assist to attain midline as she demonstrates increased forward lean initially.  Following several sit<>stands, placed RW behind pt to encourage more upright posture and posterior weight shift.  She did initially do better with this, however would then demonstrate  increased posterior lean.  Performed 3 stand pivot transfers during session at mod A level with cues for stepping sequence.  Improved control in L knee today during transfers and standing.                          PT Short Term Goals - 03/01/16 1611      PT SHORT TERM GOAL #1   Title Pt will consistently perform supine rolling in B directions with min A (with rails as needed) in order to indicate increased trunk dissociation, increased independence with bed mobility.  (Target date for all STGs: 03/29/16)   Time 4   Period Weeks   Status New     PT SHORT TERM GOAL #2   Title Pt will consistently perform supine <> sit with min A to indicate increased independence getting out of bed.    Time 4   Period Weeks   Status New     PT SHORT TERM GOAL #3   Title Pt will demonstrate midline posture in static sitting with BLE support and RUE support at a S level to indicate increased postural awareness/control.     Time 4   Period Weeks   Status New     PT SHORT TERM GOAL #4   Title Pt will consistently perform functional transfers from w/c <> mat table with mod A to decrease caregiver burden.     Time 4   Period Weeks   Status New     PT SHORT TERM GOAL #5   Title Pt will consistently perform sit <> stand from personal w/c with BUE support and mod A to decrease caregiver burden.     Time 4   Period Weeks   Status New     PT SHORT TERM GOAL #6   Title Pt will perform static standing with BUE support x1 minute with supervision while in stedy, no overt LOB to decrease caregiver burden.    Time 4   Period Weeks   Status New           PT Long Term Goals - 03/01/16 1641      PT LONG TERM GOAL #1   Title Pt will perform supine <> sit with supervision 75% of the time to indicate increased independence getting out of bed.   (Target for all LTGs: 04/26/16)   Time 8   Period Weeks   Status New     PT LONG TERM GOAL #2   Title Pt will demonstrate midline posture in  static sitting with BLE support without UE support to indicate increased postural awareness/control.    Time 8   Period Weeks   Status New     PT LONG TERM GOAL #3   Title Pt will consistently perform functional transfers at min A level 75% of the  time to decrease caregiver burden.     Time 8   Period Weeks   Status New     PT LONG TERM GOAL #4   Title Pt will perform sit <> stand from personal w/c with BUE support and min A, 75% of time to decrease caregiver burden.     Time 8   Period Weeks   Status New     PT LONG TERM GOAL #5   Title Pt will demonstrate ability to ambulate up to 5' w/ LRAD with max A in order to better assist with getting to/from restroom to decrease burden of care.    Time 8   Period Weeks   Status New               Plan - 03/26/16 1723    Clinical Impression Statement Skilled session continues to focus on standing balance for improved postural control in standing, ability to find midline, and maintaining posture.  She continues to demonstrate L lateral trunk and neck flexion with decreased WB on LLE, but can correct with cues.  Also note improved control in L knee when stepping RLE.   We did try to get onto mat today to work on bed mobility, however with regurgitation sounds, therefore quickly returned to sitting.     Rehab Potential Fair   Clinical Impairments Affecting Rehab Potential Chronicity of condition, continued medical management, but has very strong family and financial support   PT Frequency 2x / week   PT Duration 8 weeks   PT Treatment/Interventions ADLs/Self Care Home Management;DME Instruction;Gait training;Stair training;Functional mobility training;Therapeutic activities;Patient/family education;Cognitive remediation;Neuromuscular re-education;Therapeutic exercise;Balance training;Orthotic Fit/Training;Wheelchair mobility training;Manual techniques;Splinting;Visual/perceptual remediation/compensation;Vestibular;Passive range of  motion;Electrical Stimulation   PT Next Visit Plan I think we need to go back to some bed mobility (will likely have to do on a wedge to avoid emesis) esp SL>sit maintaining forward weight shift, continue to work on transfer and gait training and standing balance   Consulted and Agree with Plan of Care Patient;Family member/caregiver   Family Member Consulted caregiver, [name] and husband [name]      Patient will benefit from skilled therapeutic intervention in order to improve the following deficits and impairments:  Abnormal gait, Decreased balance, Decreased endurance, Decreased cognition, Cardiopulmonary status limiting activity, Decreased activity tolerance, Decreased coordination, Decreased strength, Impaired flexibility, Impaired tone, Decreased mobility, Increased muscle spasms, Impaired vision/preception, Decreased range of motion, Impaired UE functional use, Postural dysfunction, Improper body mechanics, Impaired perceived functional ability, Decreased knowledge of precautions, Decreased knowledge of use of DME  Visit Diagnosis: Unsteadiness on feet  Abnormal posture  Other symptoms and signs involving the nervous system     Problem List Patient Active Problem List   Diagnosis Date Noted  . Heterotopic ossification of bone 03/10/2016  . Acute on chronic respiratory failure with hypoxia ([place])   . Acute hypoxemic respiratory failure ([place])   . Acute blood loss anemia   . Atelectasis 02/04/2016  . Debility 02/03/2016  . Pneumonia of both lower lobes due to Pseudomonas species ([place])   . Pneumonia of both lower lobes due to methicillin susceptible Staphylococcus aureus (MSSA) ([place])   . Acute respiratory failure with hypoxia ([place])   . Acute encephalopathy   . Seizures ([place])   . Sepsis ([place])   . Hypoxia   . Pain   . HCAP (healthcare-associated pneumonia) 12/28/2015  . Chronic respiratory failure ([place]) 12/28/2015  . Acute tracheobronchitis 12/24/2015  . Tracheostomy tube present  ([place])   . Tracheal  stenosis   . Chronic respiratory failure with hypoxia ([place])   . 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[place]) 10/20/2015  . Tracheostomy status ([place]) 09/26/2015  . Allergic rhinitis 09/26/2015  . 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 Movement disorder 09/22/2015  . Encephalitis 09/22/2015  . Chest pain 02/07/2014  . Premature atrial contractions 01/13/2014  . Abnormality of gait 12/04/2013  . Hyperlipidemia 12/21/2011  . Cardiovascular risk factor 12/21/2011  . Bunion 01/31/2008  . Metatarsalgia of both feet 09/20/2007  . FLAT FOOT 09/20/2007  . HALLUX RIGIDUS, ACQUIRED 09/20/2007    Cameron Sprang, PT, MPT [facility] [address] [place], [place], [address] Phone: [phone]   Fax:  [phone] 03/27/16, 9:18 AM  Name: WILLOW BLACKETER MRN: [id number] Date of Birth: [birth year]

## 2016-03-26 NOTE — Therapy (Signed)
[facility] [address] [place], [place], [address] Phone: [phone]   Fax:  [phone]  Speech Language Pathology Treatment  Patient Details  Name: Judy Spears MRN: [id number] Date of Birth: Nov 01, 1950 Referring Provider: Charlett Blake, MD  Encounter Date: 03/26/2016      End of Session - 03/26/16 1654    Visit Number 8   Number of Visits 17   Date for SLP Re-Evaluation 04/30/16   SLP Start Time [id number]   SLP Stop Time  1531   SLP Time Calculation (min) 40 min   Activity Tolerance Patient tolerated treatment well      Past Medical History:  Diagnosis Date  . Arthritis    lt great toe  . Complication of anesthesia    pt has had headaches post op-did advise to hydra well preop  . Hair loss    right-sided  . History of exercise stress test    a. ETT (12/15) with 12:00 exercise, no ST changes, occasional PACs.   . Hyperlipidemia   . Insomnia   . Migraine headache   . 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 Postmenopausal   . Premature atrial contractions    a. Holter (12/15) with 8% PACs, no atrial runs or atrial fibrillation.     Past Surgical History:  Procedure Laterality Date  . BREAST BIOPSY  01/01/2011   Procedure: BREAST BIOPSY WITH NEEDLE LOCALIZATION;  Surgeon: Rolm Bookbinder, MD;  Location: [facility];  Service: General;  Laterality: Left;  left breast wire localization biopsy  . cataracts right eye    . CESAREAN SECTION  1986  . HERNIA REPAIR  2000   RIH  . opirectinal membrane peel    . RHINOPLASTY  1983    There were no vitals filed for this visit.      Subjective Assessment - 03/26/16 1454    Subjective Pt brought applesauce to therapy. "Althia Forts!"   Patient is accompained by: --  Truman Hayward, caregiver               ADULT SLP TREATMENT - 03/26/16 1502      General Information   Behavior/Cognition Alert;Cooperative;Pleasant mood;Requires  cueing;Distractible     Treatment Provided   Treatment provided Dysphagia     Dysphagia Treatment   Temperature Spikes Noted No   Respiratory Status Room air   Patient observed directly with PO's Yes   Type of PO's observed Thin liquids;Dysphagia 1 (puree)   Feeding Needs assist   Liquids provided via Cup   Oral Phase Signs & Symptoms Anterior loss/spillage   Pharyngeal Phase Signs & Symptoms Suspected delayed swallow initiation;Delayed cough;Delayed throat clear;Wet vocal quality  delay cough x1   Type of cueing Verbal;Visual   Amount of cueing Moderate   Other treatment/comments Most cueing needed for additional swallows, aothough pt spontaneously swallowed x1 dry swallow after most presentations.  Wet voice heard <5% of the time     Cognitive-Linquistic Treatment   Treatment focused on Voice;Dysarthria;Patient/family/caregiver education   Skilled Treatment Timing of voicing and articulation was targeted today in simple 3 word phrase (more applesauce please). With demo cues pt was 33% successful. SLP had pt perform each word wiht a breath, however pt frequently used one breath for the utterance. SLP educated pt caregiver and husband on possible home task options: pt to repeat lines of nursery rhymes with full breath/good voicing in order to at one time recite  to grandchildren. Pt req'd usual min-mod cues to reduce speech rate and overarticulate for incr'd listener comprehension.      Tracheostomy Shiley 4 mm Uncuffed   Tracheostomy Properties Placement Date: 02/24/16 Placement Time: [place] By: ICU physician Brand: Shiley Size (mm): 4 mm Style: Uncuffed     Assessment / Recommendations / Plan   Plan Continue with current plan of care;Goals updated     Dysphagia Recommendations   Diet recommendations Dysphagia 1 (puree);Thin liquid   Liquids provided via Teaspoon;Cup   Medication Administration Via alternative means   Supervision Full supervision/cueing for compensatory  strategies;Trained caregiver to feed patient   Compensations Effortful swallow;Multiple dry swallows after each bite/sip;Check for anterior loss;Small sips/bites;Slow rate   Postural Changes and/or Swallow Maneuvers Seated upright 90 degrees     Progression Toward Goals   Progression toward goals Progressing toward goals          SLP Education - 03/26/16 1653    Education provided Yes   Education Details simple tasks to practice timing of voicing and articulatoin   Person(s) Educated Patient;Spouse;Caregiver(s)   Methods Explanation;Demonstration   Comprehension Verbalized understanding;Need further instruction          SLP Short Term Goals - 03/26/16 1656      SLP SHORT TERM GOAL #1   Title Patient will utilize adequate breath support, slow rate and visual cues from communication partner/AAC to communicate wants, needs, thoughts and ideas.   Time 3   Period Weeks   Status Revised     SLP SHORT TERM GOAL #2   Title Patient will utilize intelligibility strategies, breath support with mod A for phrase repetition in 75% of trials.   Baseline 2.12   Time 2   Period Weeks   Status On-going     SLP SHORT TERM GOAL #3   Title Pt will swallow thin liquids, purees and soft solids in 9/10 trials with no overt signs of aspiration.   Time 4   Period Weeks   Status Revised          SLP Long Term Goals - 03/26/16 1658      SLP LONG TERM GOAL #1   Title Patient will utilize multimodal communication/AAC to clarify wants, needs, thoughts or ideas during communication breakdowns with max A   Time 4   Period Weeks   Status On-going     SLP LONG TERM GOAL #2   Title Patient will utilize intelligibility strategies, breath support with min A for 5 word functional phrases with 80% accuracy.   Time 4   Period Weeks   Status On-going     SLP LONG TERM GOAL #3   Title Patient will demonstrate readiness for MBS to determine safety for pleasure feeds by timely initiation of swallow  in 3/5 trials with ice chips/H20.   Status Achieved     SLP LONG TERM GOAL #4   Title Patient will tolerate pleasure feeds of 8 oz thin liquids, purees or soft solids.   Time 4   Period Weeks   Status On-going          Plan - 03/26/16 1654    Clinical Impression Statement Patient with sustained alertness and attention to session tasks, with rare tactile cues to remain alert for the entire session.  Pt's swallow trigger time was 2-3 seconds with 1/2 teaspoons applesauce followed by sips icewater provided by SLP via cup. See "skilled treatment" for more details. Recommend skilled ST in order to maximize functional  communication, swallowing safety, reduce caregiver burden and improve quality of life.   Speech Therapy Frequency 2x / week   Duration Other (comment)   Treatment/Interventions Aspiration precaution training;Trials of upgraded texture/liquids;Compensatory strategies;Functional tasks;Patient/family education;Cueing hierarchy;Diet toleration management by SLP;Cognitive reorganization;Compensatory techniques;SLP instruction and feedback;Internal/external aids;Multimodal communcation approach   Potential to Achieve Goals Good   Potential Considerations Ability to learn/carryover information;Co-morbidities;Severity of impairments   SLP Home Exercise Plan PO trials of ice/H20 following oral care, continue utilizing multimodal techniques to improve relay of information, functional phrases   Consulted and Agree with Plan of Care Family member/caregiver;Patient   Family Member Consulted caregiver [name], husband [name]      Patient will benefit from skilled therapeutic intervention in order to improve the following deficits and impairments:   Dysphagia, oropharyngeal phase  Dysarthria and anarthria  Cognitive communication deficit    Problem List Patient Active Problem List   Diagnosis Date Noted  . Heterotopic ossification of bone 03/10/2016  . Acute on chronic respiratory failure  with hypoxia ([place])   . Acute hypoxemic respiratory failure ([place])   . Acute blood loss anemia   . Atelectasis 02/04/2016  . Debility 02/03/2016  . Pneumonia of both lower lobes due to Pseudomonas species ([place])   . Pneumonia of both lower lobes due to methicillin susceptible Staphylococcus aureus (MSSA) ([place])   . Acute respiratory failure with hypoxia ([place])   . Acute encephalopathy   . Seizures ([place])   . Sepsis ([place])   . Hypoxia   . Pain   . HCAP (healthcare-associated pneumonia) 12/28/2015  . Chronic respiratory failure ([place]) 12/28/2015  . Acute tracheobronchitis 12/24/2015  . Tracheostomy tube present ([place])   . Tracheal stenosis   . Chronic respiratory failure with hypoxia ([place])   . 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[place]) 10/20/2015  . Tracheostomy status ([place]) 09/26/2015  . Allergic rhinitis 09/26/2015  . 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 Movement disorder 09/22/2015  . Encephalitis 09/22/2015  . Chest pain 02/07/2014  . Premature atrial contractions 01/13/2014  . Abnormality of gait 12/04/2013  . Hyperlipidemia 12/21/2011  . Cardiovascular risk factor 12/21/2011  . Bunion 01/31/2008  . Metatarsalgia of both feet 09/20/2007  . FLAT FOOT 09/20/2007  . HALLUX RIGIDUS, ACQUIRED 09/20/2007    [facility] ,MS, CCC-SLP  03/26/2016, 4:59 PM  [facility] [address] [place], [place], [address] Phone: [phone]   Fax:  [phone]   Name: MARTASIA WILKING MRN: [id number] Date of Birth: 1950/02/07

## 2016-03-29 ENCOUNTER — Other Ambulatory Visit: Payer: Self-pay | Admitting: Physical Medicine & Rehabilitation

## 2016-03-29 MED ORDER — BACLOFEN 10 MG PO TABS: 10.0000 mg | ORAL_TABLET | Freq: Four times a day (QID) | ORAL | 5 refills | Status: DC

## 2016-03-30 ENCOUNTER — Ambulatory Visit: Payer: BC Managed Care – PPO | Admitting: Physical Therapy

## 2016-03-30 ENCOUNTER — Ambulatory Visit: Payer: BC Managed Care – PPO | Admitting: Occupational Therapy

## 2016-03-30 ENCOUNTER — Encounter: Payer: Self-pay | Admitting: Occupational Therapy

## 2016-03-30 ENCOUNTER — Ambulatory Visit: Payer: BC Managed Care – PPO

## 2016-03-30 DIAGNOSIS — R2681 Unsteadiness on feet: Secondary | ICD-10-CM

## 2016-03-30 DIAGNOSIS — M6281 Muscle weakness (generalized): Secondary | ICD-10-CM

## 2016-03-30 DIAGNOSIS — R1312 Dysphagia, oropharyngeal phase: Secondary | ICD-10-CM

## 2016-03-30 DIAGNOSIS — R471 Dysarthria and anarthria: Secondary | ICD-10-CM

## 2016-03-30 DIAGNOSIS — R41844 Frontal lobe and executive function deficit: Secondary | ICD-10-CM

## 2016-03-30 DIAGNOSIS — R41841 Cognitive communication deficit: Secondary | ICD-10-CM

## 2016-03-30 DIAGNOSIS — R29818 Other symptoms and signs involving the nervous system: Secondary | ICD-10-CM

## 2016-03-30 DIAGNOSIS — M25612 Stiffness of left shoulder, not elsewhere classified: Secondary | ICD-10-CM

## 2016-03-30 DIAGNOSIS — M25622 Stiffness of left elbow, not elsewhere classified: Secondary | ICD-10-CM

## 2016-03-30 DIAGNOSIS — M25611 Stiffness of right shoulder, not elsewhere classified: Secondary | ICD-10-CM

## 2016-03-30 DIAGNOSIS — R293 Abnormal posture: Secondary | ICD-10-CM

## 2016-03-30 DIAGNOSIS — R2689 Other abnormalities of gait and mobility: Secondary | ICD-10-CM

## 2016-03-30 NOTE — Therapy (Signed)
[facility] [address] [place], [place], [address] Phone: [phone]   Fax:  [phone]  Physical Therapy Treatment  Patient Details  Name: Judy Spears MRN: [id number] Date of Birth: 12-09-1950 Referring Provider: Alger Simons, MD/Andrew Letta Pate, MD  Encounter Date: 03/30/2016      PT End of Session - 03/30/16 2131    Visit Number 9   Number of Visits 20   Date for PT Re-Evaluation 04/30/16   Authorization Type BCBS   PT Start Time 1020   PT Stop Time 1103   PT Time Calculation (min) 43 min      Past Medical History:  Diagnosis Date  . Arthritis    lt great toe  . Complication of anesthesia    pt has had headaches post op-did advise to hydra well preop  . Hair loss    right-sided  . History of exercise stress test    a. ETT (12/15) with 12:00 exercise, no ST changes, occasional PACs.   . Hyperlipidemia   . Insomnia   . Migraine headache   . 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 Postmenopausal   . Premature atrial contractions    a. Holter (12/15) with 8% PACs, no atrial runs or atrial fibrillation.     Past Surgical History:  Procedure Laterality Date  . BREAST BIOPSY  01/01/2011   Procedure: BREAST BIOPSY WITH NEEDLE LOCALIZATION;  Surgeon: Rolm Bookbinder, MD;  Location: [facility];  Service: General;  Laterality: Left;  left breast wire localization biopsy  . cataracts right eye    . CESAREAN SECTION  1986  . HERNIA REPAIR  2000   RIH  . opirectinal membrane peel    . RHINOPLASTY  1983    There were no vitals filed for this visit.      Subjective Assessment - 03/30/16 2118    Subjective Husband states pt ate chicken pie and squash casserole for dinner last night; states she is walking at home with assistance "doing great"   Patient is accompained by: Family member   Pertinent History Precautions:  PEG.  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  Another round of botox in LLE on 02/06/16   Patient Stated Goals Per husband, "For [place] to be as independent as possible."   Currently in Pain? No/denies                         [facility] Adult PT Treatment/Exercise - 03/30/16 1051      Bed Mobility   Rolling Right 4: Min guard   Rolling Right Details (indicate cue type and reason) cues/min assist to flex LLE   Rolling Left 4: Min assist   Left Sidelying to Sit 3: Mod assist   Sit to Supine 4: Min assist     Transfers   Transfers Sit to Stand   Sit to Stand 4: Min assist   Sit to Stand Details Tactile cues for sequencing;Verbal cues for technique;Tactile cues for posture;Tactile cues for weight shifting   Stand to Sit 3: Mod assist   Stand to Sit Details pt needs min assist to flex hips to sit down with control   Stand Pivot Transfers 3: Mod assist;4: Min assist     Ambulation/Gait   Ambulation/Gait  Yes   Ambulation/Gait Assistance Other (comment)  +2 mod assist   Ambulation Distance (Feet) 50 Feet  35,30   Assistive device 2 person hand held assist  platform RW trialed - unsuccessful    Gait Pattern Decreased dorsiflexion - right;Decreased hip/knee flexion - right;Right flexed knee in stance;Left flexed knee in stance;Right foot flat;Left foot flat;Scissoring;Lateral trunk lean to left;Decreased weight shift to right;Narrow base of support;Poor foot clearance - right;Decreased trunk rotation   Ambulation Surface Level;Indoor                  PT Short Term Goals - 03/30/16 2105      PT SHORT TERM GOAL #1   Title Pt will consistently perform supine rolling in B directions with min A (with rails as needed) in order to indicate increased trunk dissociation, increased independence with bed mobility.  (Target date for all STGs: 03/29/16)    Baseline rolled to R with CGA to min assist on 03-30-16; min assist from supine to Lt sidelying   Status Achieved     PT SHORT TERM GOAL #2   Title Pt will consistently perform supine <> sit with min A to indicate increased independence getting out of bed.    Baseline min to mod assist needed on 03-30-16   Status On-going     PT SHORT TERM GOAL #3   Title Pt will demonstrate midline posture in static sitting with BLE support and RUE support at a S level to indicate increased postural awareness/control.     Baseline met 03-30-16   Status Achieved     PT SHORT TERM GOAL #4   Title Pt will consistently perform functional transfers from w/c <> mat table with mod A to decrease caregiver burden.     Baseline mod assist with stand pivot transfers - 03-30-16   Status Achieved     PT SHORT TERM GOAL #5   Title Pt will consistently perform sit <> stand from personal w/c with BUE support and mod A to decrease caregiver burden.     Baseline met 03-30-16   Status Achieved     PT SHORT TERM GOAL #6   Title Pt will perform static standing with BUE support x1 minute with supervision while in stedy, no overt LOB to decrease caregiver burden.    Baseline pt able to stand unsupported with mod to min assist - not using Stedy in PT  (03-30-16)   Status Achieved           PT Long Term Goals - 03/01/16 1641      PT LONG TERM GOAL #1   Title Pt will perform supine <> sit with supervision 75% of the time to indicate increased independence getting out of bed.   (Target for all LTGs: 04/26/16)   Time 8   Period Weeks   Status New     PT LONG TERM GOAL #2   Title Pt will demonstrate midline posture in static sitting with BLE support without UE support to indicate increased postural awareness/control.    Time 8   Period Weeks   Status New     PT LONG TERM GOAL #3   Title Pt will consistently perform functional transfers at min A level 75% of the time to decrease caregiver burden.     Time 8   Period Weeks    Status New     PT LONG TERM GOAL #4   Title Pt will perform sit <> stand from personal w/c with  BUE support and min A, 75% of time to decrease caregiver burden.     Time 8   Period Weeks   Status New     PT LONG TERM GOAL #5   Title Pt will demonstrate ability to ambulate up to 5' w/ LRAD with max A in order to better assist with getting to/from restroom to decrease burden of care.    Time 8   Period Weeks   Status New               Plan - 03/30/16 2132    Clinical Impression Statement Pt has met all STG's, demonstrating improvements in standing balance, transfers, bed mobility and gait.  Pt continues to lean left but able to sit upright with tactile and verbal cues.  Gait much improved with cues to extend Rt knee and shift weight by bringing hips forward over right foot prior to stepping with LLE.   Rehab Potential Fair   Clinical Impairments Affecting Rehab Potential Chronicity of condition, continued medical management, but has very strong family and financial support   PT Frequency 2x / week   PT Duration 8 weeks   PT Treatment/Interventions ADLs/Self Care Home Management;DME Instruction;Gait training;Stair training;Functional mobility training;Therapeutic activities;Patient/family education;Cognitive remediation;Neuromuscular re-education;Therapeutic exercise;Balance training;Orthotic Fit/Training;Wheelchair mobility training;Manual techniques;Splinting;Visual/perceptual remediation/compensation;Vestibular;Passive range of motion;Electrical Stimulation   PT Next Visit Plan cont gait and standing balance   Consulted and Agree with Plan of Care Patient;Family member/caregiver   Family Member Consulted caregiver, Judy Spears and husband Judy Spears      Patient will benefit from skilled therapeutic intervention in order to improve the following deficits and impairments:  Abnormal gait, Decreased balance, Decreased endurance, Decreased cognition, Cardiopulmonary status limiting activity,  Decreased activity tolerance, Decreased coordination, Decreased strength, Impaired flexibility, Impaired tone, Decreased mobility, Increased muscle spasms, Impaired vision/preception, Decreased range of motion, Impaired UE functional use, Postural dysfunction, Improper body mechanics, Impaired perceived functional ability, Decreased knowledge of precautions, Decreased knowledge of use of DME  Visit Diagnosis: Muscle weakness (generalized)  Other abnormalities of gait and mobility     Problem List Patient Active Problem List   Diagnosis Date Noted  . Heterotopic ossification of bone 03/10/2016  . Acute on chronic respiratory failure with hypoxia ([facility])   . Acute hypoxemic respiratory failure ([facility])   . Acute blood loss anemia   . Atelectasis 02/04/2016  . Debility 02/03/2016  . Pneumonia of both lower lobes due to Pseudomonas species ([facility])   . Pneumonia of both lower lobes due to methicillin susceptible Staphylococcus aureus (MSSA) ([facility])   . Acute respiratory failure with hypoxia ([place])   . Acute encephalopathy   . Seizures ([facility])   . Sepsis ([place])   . Hypoxia   . Pain   . HCAP (healthcare-associated pneumonia) 12/28/2015  . Chronic respiratory failure ([place]) 12/28/2015  . Acute tracheobronchitis 12/24/2015  . Tracheostomy tube present ([facility])   . Tracheal stenosis   . Chronic respiratory failure with hypoxia ([place])   . 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 Tracheostomy status ([facility]) 09/26/2015  . Allergic rhinitis 09/26/2015  . 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 Movement disorder 09/22/2015  . Encephalitis 09/22/2015  . Chest pain 02/07/2014  . Premature atrial contractions 01/13/2014  . Abnormality of gait 12/04/2013  . Hyperlipidemia 12/21/2011  . Cardiovascular risk factor 12/21/2011  . Bunion 01/31/2008  . Metatarsalgia of both feet 09/20/2007  . FLAT FOOT 09/20/2007  . HALLUX RIGIDUS, ACQUIRED 09/20/2007    Alda Lea, PT 03/30/2016, 9:38 PM  [facility] [address] [place], [place], [address] Phone: [phone]   Fax:  [phone]  Name: CHANDELLE HARKEY MRN: [id number] Date of Birth: 02/15/1950

## 2016-03-30 NOTE — Patient Instructions (Signed)
Your Splint This splint should initially be fitted by a healthcare practitioner.  The healthcare practitioner is responsible for providing wearing instructions and precautions to the patient, other healthcare practitioners and care provider involved in the patient's care.  This splint was custom made for you. Please read the following instructions to learn about wearing and caring for your splint.  Precautions Should your splint cause any of the following problems, remove the splint immediately and contact your therapist/physician.  Swelling  Severe Pain  Pressure Areas  Stiffness  Numbness  Do not wear your splint while operating machinery unless it has been fabricated for that purpose.  When To Wear Your Splint Where your splint according to your therapist/physician instructions.  Day One (today):  Wear 2 hours on, 2 hours off then 2 hours on during the day.  DO NOT WEAR AT NIGHT YET!! Day Two (Wednesday):  If not issues, wear 4  Hours on, 2 hours off, then 4 hours on during the day. DO NOT WEAR AT [place]!! Day Three (Thursday):  If no issues, do NOT wear during the day.  [name] can now wear it all night from this point forward. She should no longer wear it during the day.   Care and Cleaning of Your Splint 1. Keep your splint away from open flames. 2. Your splint will lose its shape in temperatures over 135 degrees Farenheit, ( in car windows, near radiators, ovens or in hot water).  Never make any adjustments to your splint, if the splint needs adjusting remove it and make an appointment to see your therapist. 3. Your splint, including the cushion liner may be cleaned with soap and lukewarm water.  Do not immerse in hot water over 135 degrees Farenheit. 4. Straps may be washed with soap and water, but do not moisten the self-adhesive portion. 5. For ink or hard to remove spots use a scouring cleanser which contains chlorine.  Rinse the splint thoroughly after using chlorine  cleanser.

## 2016-03-30 NOTE — Therapy (Signed)
[facility] [address] [place], [place], [address] Phone: [phone]   Fax:  [phone]  Occupational Therapy Treatment  Patient Details  Name: Judy Spears MRN: [id number] Date of Birth: [birth year] Referring Provider: Dr. Quita Skye. [name]  Encounter Date: 03/30/2016      OT End of Session - 03/30/16 1247    Visit Number 8   Number of Visits 16   Date for OT Re-Evaluation 04/26/16   Authorization Type BCBS unlimited visits   OT Start Time 1101   OT Stop Time 1146   OT Time Calculation (min) 45 min   Activity Tolerance Patient tolerated treatment well      Past Medical History:  Diagnosis Date  . Arthritis    lt great toe  . Complication of anesthesia    pt has had headaches post op-did advise to hydra well preop  . Hair loss    right-sided  . History of exercise stress test    a. ETT (12/15) with 12:00 exercise, no ST changes, occasional PACs.   . Hyperlipidemia   . Insomnia   . Migraine headache   . 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 Postmenopausal   . Premature atrial contractions    a. Holter (12/15) with 8% PACs, no atrial runs or atrial fibrillation.     Past Surgical History:  Procedure Laterality Date  . BREAST BIOPSY  01/01/2011   Procedure: BREAST BIOPSY WITH NEEDLE LOCALIZATION;  Surgeon: Rolm Bookbinder, MD;  Location: [facility];  Service: General;  Laterality: Left;  left breast wire localization biopsy  . cataracts right eye    . CESAREAN SECTION  1986  . HERNIA REPAIR  2000   RIH  . opirectinal membrane peel    . RHINOPLASTY  1983    There were no vitals filed for this visit.      Subjective Assessment - 03/30/16 1241    Subjective  I feel good today   Patient is accompained by: --  Wyatt Haste   Pertinent History see epic snapshot - ABI/encephalitis/hypoxia from virus; recent hospitalization due to ARF;    Patient Stated Goals Pt unable to  state.    Currently in Pain? No/denies                      OT Treatments/Exercises (OP) - 03/30/16 0001      Splinting   Splinting Fabricated resting hand splint for R hand as pt presents with dystonic posturing at rest and with intentional use in R hand.  Reinforced splint as pt demostrates rythmic flexing of wrist.  Caregiver instructed in wearing schedule, what to monitor related to toleration and care of splint.  Verbalized understanding.                 OT Education - 03/30/16 1244    Education provided Yes   Education Details wear and care of R resting hand splint   Person(s) Educated Patient;Caregiver(s)   Methods Explanation;Demonstration;Handout   Comprehension Verbalized understanding;Returned demonstration          OT Short Term Goals - 03/30/16 1244      OT SHORT TERM GOAL #1   Title Pt, husband and cargiver will be mod I with HEP - 03/29/2016   Status Achieved     OT SHORT TERM GOAL #2   Title Pt will be able to sit with supervision without UE support and vc's to assist  with midline 75% while engage in functional task   Status Achieved     OT SHORT TERM GOAL #3   Title Pt will be able to complete sit to stand consistently with mod A for toilet transfers (max a for pivot)   Status Achieved     OT SHORT TERM GOAL #4   Title Pt, husband and caregiver will verbalize understanding of various bathroom accomodations to reduce long term caregiver burden   Status On-going           OT Long Term Goals - 03/30/16 1245      OT LONG TERM GOAL #1   Title Pt, husband and caregiver will be mod I with upgraded HEP prn - 04/26/2016   Status On-going     OT LONG TERM GOAL #2   Title Pt, husband and caregiver will be mod I with splint wear and care    Status On-going     OT LONG TERM GOAL #3   Title Pt will be able to wash face with min a only   Status On-going     OT LONG TERM GOAL #4   Title Pt will be able to sit without UE support with  supevision 100% of the time with no more than 2 vc's while engaged in functional activity.   Status On-going     OT LONG TERM GOAL #5   Title Pt, husband and caregiver will verbalize understanding of potential after care therapeutic opportunities.    Status On-going               Plan - 03/30/16 1245    Clinical Impression Statement Pt progressing toward goals.  Caregiver [name] able to verbalize rationale for splint and care.    Rehab Potential Fair   Clinical Impairments Affecting Rehab Potential severity of deficits, very slow rate of recovery   OT Frequency 2x / week   OT Duration 8 weeks   OT Treatment/Interventions Self-care/ADL training;Ultrasound;Moist Heat;Electrical Stimulation;DME and/or AE instruction;Neuromuscular education;Therapeutic exercise;Functional Mobility Training;Manual Therapy;Passive range of motion;Splinting;Therapeutic activities;Balance training;Patient/family education;Visual/perceptual remediation/compensation;Cognitive remediation/compensation   Plan check on R resting hand splint,  functional use of UE's with possible home activities program. NMR for trunk/static to dynamic standing balance, functional mobility   Consulted and Agree with Plan of Care Patient;Family member/caregiver   Family Member Consulted caregiver [name], husband [name]       Patient will benefit from skilled therapeutic intervention in order to improve the following deficits and impairments:  Decreased endurance, Decreased skin integrity, Impaired vision/preception, Improper body mechanics, Impaired perceived functional ability, Decreased knowledge of precautions, Cardiopulmonary status limiting activity, Decreased activity tolerance, Decreased knowledge of use of DME, Decreased strength, Impaired flexibility, Improper spinal/pelvic alignment, Impaired sensation, Difficulty walking, Decreased mobility, Decreased balance, Decreased cognition, Decreased range of motion, Impaired tone, Impaired  UE functional use, Decreased safety awareness, Decreased coordination, Pain  Visit Diagnosis: Muscle weakness (generalized)  Unsteadiness on feet  Abnormal posture  Other symptoms and signs involving the nervous system  Stiffness of left shoulder, not elsewhere classified  Stiffness of right shoulder, not elsewhere classified  Stiffness of left elbow, not elsewhere classified  Frontal lobe and executive function deficit    Problem List Patient Active Problem List   Diagnosis Date Noted  . Heterotopic ossification of bone 03/10/2016  . Acute on chronic respiratory failure with hypoxia ([place])   . Acute hypoxemic respiratory failure ([place])   . Acute blood loss anemia   . Atelectasis 02/04/2016  . Debility 02/03/2016  .  Pneumonia of both lower lobes due to Pseudomonas species ([place])   . Pneumonia of both lower lobes due to methicillin susceptible Staphylococcus aureus (MSSA) ([place])   . Acute respiratory failure with hypoxia ([place])   . Acute encephalopathy   . Seizures ([place])   . Sepsis ([place])   . Hypoxia   . Pain   . HCAP (healthcare-associated pneumonia) 12/28/2015  . Chronic respiratory failure ([place]) 12/28/2015  . Acute tracheobronchitis 12/24/2015  . Tracheostomy tube present ([place])   . Tracheal stenosis   . Chronic respiratory failure with hypoxia ([place])   . 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 Tracheostomy status ([facility]) 09/26/2015  . Allergic rhinitis 09/26/2015  . 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 Movement disorder 09/22/2015  . Encephalitis 09/22/2015  . Chest pain 02/07/2014  . Premature atrial contractions 01/13/2014  . Abnormality of gait 12/04/2013  . Hyperlipidemia 12/21/2011  . Cardiovascular risk factor 12/21/2011  . Bunion 01/31/2008  . Metatarsalgia of both feet 09/20/2007  . FLAT FOOT 09/20/2007  . HALLUX RIGIDUS, ACQUIRED 09/20/2007    Quay Burow, OTR/L 03/30/2016, 12:52 PM  [facility] [address] [place], [place], [address] Phone: [phone]   Fax:  [phone]  Name: Judy Spears MRN: [id number] Date of Birth: 05-06-1950

## 2016-03-30 NOTE — Therapy (Signed)
[facility] [address] [place], [place], [address] Phone: [phone]   Fax:  [phone]  Speech Language Pathology Treatment  Patient Details  Name: Judy Spears MRN: [id number] Date of Birth: [birth year] Referring Provider: Charlett Blake, MD  Encounter Date: 03/30/2016      End of Session - 03/30/16 1437    Visit Number 9   Number of Visits 17   Date for SLP Re-Evaluation 04/30/16   SLP Start Time 1149   SLP Stop Time  1230   SLP Time Calculation (min) 41 min   Activity Tolerance Patient tolerated treatment well      Past Medical History:  Diagnosis Date  . Arthritis    lt great toe  . Complication of anesthesia    pt has had headaches post op-did advise to hydra well preop  . Hair loss    right-sided  . History of exercise stress test    a. ETT (12/15) with 12:00 exercise, no ST changes, occasional PACs.   . Hyperlipidemia   . Insomnia   . Migraine headache   . 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 Postmenopausal   . Premature atrial contractions    a. Holter (12/15) with 8% PACs, no atrial runs or atrial fibrillation.     Past Surgical History:  Procedure Laterality Date  . BREAST BIOPSY  01/01/2011   Procedure: BREAST BIOPSY WITH NEEDLE LOCALIZATION;  Surgeon: Rolm Bookbinder, MD;  Location: [facility];  Service: General;  Laterality: Left;  left breast wire localization biopsy  . cataracts right eye    . CESAREAN SECTION  1986  . HERNIA REPAIR  2000   RIH  . opirectinal membrane peel    . RHINOPLASTY  1983    There were no vitals filed for this visit.      Subjective Assessment - 03/30/16 1158    Subjective (P)  Inaudible greeting to SLP. Pt asleep prior to arriving at [place]. Pt had pureed chicken pie, rice, butternut squash soup, and wine last night for dinner.               ADULT SLP TREATMENT - 03/30/16 1158      General Information   Behavior/Cognition Decreased sustained attention;Lethargic;Cooperative;Pleasant mood  Somnolent at times     Treatment Provided   Treatment provided Dysphagia     Dysphagia Treatment   Temperature Spikes Noted No   Respiratory Status Room air   Patient observed directly with PO's Yes   Type of PO's observed Dysphagia 1 (puree);Thin liquids   Feeding Needs assist   Liquids provided via Cup   Oral Phase Signs & Symptoms Anterior loss/spillage   Pharyngeal Phase Signs & Symptoms Suspected delayed swallow initiation;Audible swallow;Multiple swallows  minimal hydrophonic voice today    Type of cueing Verbal;Visual   Amount of cueing Moderate   Other treatment/comments Mod cues listed above to have pt swallow second time 50% of the time - 50% of the time pt swallowed xecond swallow spontaneosly. Hydrophonic voice heard after initial swallow 2/18. Cued throat clear was weak/ill-timed and lacked power ("uhhh-huh!", "mmmmm"). Minimal oral residue but SLP suspects bolus remained in pharynx after first swallow due to what appeared to be material consistently sounding like it was swallowed during second swallow.     Cognitive-Linquistic Treatment   Treatment focused on Voice;Dysarthria   Skilled Treatment SLP required pt to ask for another bite of applesauce - pt req'd  min-mod A (verbal and non-verbal/visual) 90% of the time to request POs.  Pt practiced timing of breath-voice sequence with days of week with 95% success, and months of hte year with 42% success.     Tracheostomy Shiley 4 mm Uncuffed   Tracheostomy Properties Placement Date: 02/24/16 Placement Time: [place] By: ICU physician Brand: Shiley Size (mm): 4 mm Style: Uncuffed     Assessment / Recommendations / Plan   Plan Continue with current plan of care;Goals updated     Dysphagia Recommendations   Diet recommendations Dysphagia 1 (puree);Thin liquid   Liquids provided via Teaspoon;Cup   Medication Administration Via alternative  means   Supervision Full supervision/cueing for compensatory strategies;Trained caregiver to feed patient   Compensations Effortful swallow;Multiple dry swallows after each bite/sip;Check for anterior loss;Small sips/bites;Slow rate   Postural Changes and/or Swallow Maneuvers Seated upright 90 degrees     Progression Toward Goals   Progression toward goals Progressing toward goals            SLP Short Term Goals - 03/30/16 1439      SLP SHORT TERM GOAL #1   Title Patient will utilize adequate breath support, slow rate and visual cues from communication partner/AAC to communicate wants, needs, thoughts and ideas.   Time 2   Period Weeks   Status Revised     SLP SHORT TERM GOAL #2   Title Patient will utilize intelligibility strategies, breath support with mod A for phrase repetition in 75% of trials.   Baseline 2.12.18   Time 2   Period Weeks   Status On-going     SLP SHORT TERM GOAL #3   Title Pt will swallow thin liquids, purees and soft solids in 9/10 trials with no overt signs of aspiration.   Status Achieved          SLP Long Term Goals - 03/30/16 1439      SLP LONG TERM GOAL #1   Title Patient will utilize multimodal communication/AAC to clarify wants, needs, thoughts or ideas during communication breakdowns with max A   Time 3   Period Weeks   Status On-going     SLP LONG TERM GOAL #2   Title Patient will utilize intelligibility strategies, breath support with min A for 5 word functional phrases with 80% accuracy.   Time 3   Status On-going     SLP LONG TERM GOAL #3   Title Patient will demonstrate readiness for MBS to determine safety for pleasure feeds by timely initiation of swallow in 3/5 trials with ice chips/H20.   Status Achieved     SLP LONG TERM GOAL #4   Title Patient will tolerate pleasure feeds of 8 oz thin liquids, purees or soft solids.   Time 3   Period Weeks   Status On-going          Plan - 03/30/16 1438    Clinical Impression  Statement Patient with sustained alertness and attention to session tasks, and even though somnolent prior to ST, remained alert for the entire session.  Pt's swallow trigger time was no greater than 2 seconds with 1/2 teaspoons applesauce followed by sips icewater provided by SLP via cup. See "skilled treatment" for more details. Recommend skilled ST in order to maximize functional communication, swallowing safety, reduce caregiver burden and improve quality of life.   Speech Therapy Frequency 2x / week   Duration Other (comment)   Treatment/Interventions Aspiration precaution training;Trials of upgraded texture/liquids;Compensatory strategies;Functional tasks;Patient/family education;Cueing hierarchy;Diet toleration  management by SLP;Cognitive reorganization;Compensatory techniques;SLP instruction and feedback;Internal/external aids;Multimodal communcation approach   Potential to Achieve Goals Good   Potential Considerations Ability to learn/carryover information;Co-morbidities;Severity of impairments   SLP Home Exercise Plan PO trials of ice/H20 following oral care, continue utilizing multimodal techniques to improve relay of information, functional phrases   Consulted and Agree with Plan of Care Family member/caregiver;Patient   Family Member Consulted caregiver [name], husband [name]      Patient will benefit from skilled therapeutic intervention in order to improve the following deficits and impairments:   Dysarthria and anarthria  Dysphagia, oropharyngeal phase  Cognitive communication deficit    Problem List Patient Active Problem List   Diagnosis Date Noted  . Heterotopic ossification of bone 03/10/2016  . Acute on chronic respiratory failure with hypoxia ([place])   . Acute hypoxemic respiratory failure ([place])   . Acute blood loss anemia   . Atelectasis 02/04/2016  . Debility 02/03/2016  . Pneumonia of both lower lobes due to Pseudomonas species ([place])   . Pneumonia of both lower lobes  due to methicillin susceptible Staphylococcus aureus (MSSA) ([place])   . Acute respiratory failure with hypoxia ([place])   . Acute encephalopathy   . Seizures ([place])   . Sepsis ([place])   . Hypoxia   . Pain   . HCAP (healthcare-associated pneumonia) 12/28/2015  . Chronic respiratory failure ([place]) 12/28/2015  . Acute tracheobronchitis 12/24/2015  . Tracheostomy tube present ([place])   . Tracheal stenosis   . Chronic respiratory failure with hypoxia ([place])   . 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[place]) 10/20/2015  . Tracheostomy status ([place]) 09/26/2015  . Allergic rhinitis 09/26/2015  . 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 Movement disorder 09/22/2015  . Encephalitis 09/22/2015  . Chest pain 02/07/2014  . Premature atrial contractions 01/13/2014  . Abnormality of gait 12/04/2013  . Hyperlipidemia 12/21/2011  . Cardiovascular risk factor 12/21/2011  . Bunion 01/31/2008  . Metatarsalgia of both feet 09/20/2007  . FLAT FOOT 09/20/2007  . HALLUX RIGIDUS, ACQUIRED 09/20/2007    [facility] ,[place], CCC-SLP  03/30/2016, 2:41 PM  [facility] [address] [place], [place], [address] Phone: [phone]   Fax:  [phone]   Name: Judy Spears MRN: [id number] Date of Birth: [birth year]

## 2016-04-01 ENCOUNTER — Telehealth: Payer: Self-pay | Admitting: *Deleted

## 2016-04-01 ENCOUNTER — Ambulatory Visit: Payer: BC Managed Care – PPO | Admitting: Physical Therapy

## 2016-04-01 ENCOUNTER — Ambulatory Visit: Payer: BC Managed Care – PPO | Admitting: Occupational Therapy

## 2016-04-01 ENCOUNTER — Ambulatory Visit: Payer: BC Managed Care – PPO | Admitting: Speech Pathology

## 2016-04-01 DIAGNOSIS — R2681 Unsteadiness on feet: Secondary | ICD-10-CM

## 2016-04-01 DIAGNOSIS — R41841 Cognitive communication deficit: Secondary | ICD-10-CM

## 2016-04-01 DIAGNOSIS — M6281 Muscle weakness (generalized): Secondary | ICD-10-CM

## 2016-04-01 DIAGNOSIS — M25612 Stiffness of left shoulder, not elsewhere classified: Secondary | ICD-10-CM

## 2016-04-01 DIAGNOSIS — R293 Abnormal posture: Secondary | ICD-10-CM

## 2016-04-01 DIAGNOSIS — R471 Dysarthria and anarthria: Secondary | ICD-10-CM

## 2016-04-01 DIAGNOSIS — R2689 Other abnormalities of gait and mobility: Secondary | ICD-10-CM

## 2016-04-01 DIAGNOSIS — R1312 Dysphagia, oropharyngeal phase: Secondary | ICD-10-CM

## 2016-04-01 DIAGNOSIS — M25622 Stiffness of left elbow, not elsewhere classified: Secondary | ICD-10-CM

## 2016-04-01 DIAGNOSIS — R41844 Frontal lobe and executive function deficit: Secondary | ICD-10-CM

## 2016-04-01 DIAGNOSIS — R29818 Other symptoms and signs involving the nervous system: Secondary | ICD-10-CM

## 2016-04-01 DIAGNOSIS — M25611 Stiffness of right shoulder, not elsewhere classified: Secondary | ICD-10-CM

## 2016-04-01 NOTE — Therapy (Signed)
[facility] [address] [place], [place], [address] Phone: [phone]   Fax:  [phone]  Speech Language Pathology Treatment  Patient Details  Name: Judy Spears MRN: [id number] Date of Birth: March 24, 1950 Referring Provider: Charlett Blake, MD  Encounter Date: 04/01/2016      End of Session - 04/01/16 1602    Visit Number 10   Number of Visits 17   Date for SLP Re-Evaluation 04/30/16   SLP Start Time 1446   SLP Stop Time  9892   SLP Time Calculation (min) 44 min   Activity Tolerance Patient tolerated treatment well      Past Medical History:  Diagnosis Date  . Arthritis    lt great toe  . Complication of anesthesia    pt has had headaches post op-did advise to hydra well preop  . Hair loss    right-sided  . History of exercise stress test    a. ETT (12/15) with 12:00 exercise, no ST changes, occasional PACs.   . Hyperlipidemia   . Insomnia   . Migraine headache   . 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 Postmenopausal   . Premature atrial contractions    a. Holter (12/15) with 8% PACs, no atrial runs or atrial fibrillation.     Past Surgical History:  Procedure Laterality Date  . BREAST BIOPSY  01/01/2011   Procedure: BREAST BIOPSY WITH NEEDLE LOCALIZATION;  Surgeon: Rolm Bookbinder, MD;  Location: [facility];  Service: General;  Laterality: Left;  left breast wire localization biopsy  . cataracts right eye    . CESAREAN SECTION  1986  . HERNIA REPAIR  2000   RIH  . opirectinal membrane peel    . RHINOPLASTY  1983    There were no vitals filed for this visit.      Subjective Assessment - 04/01/16 1534    Subjective Mouthing words, voice inaudible upon arrival to [facility].   Special Tests spouse, [name]; caregiver [name]   Currently in Pain? No/denies               ADULT SLP TREATMENT - 04/01/16 1535      General Information   Behavior/Cognition  Alert;Cooperative;Pleasant mood   Patient Positioning Upright in chair   Oral care provided Yes     Treatment Provided   Treatment provided Dysphagia;Cognitive-Linquistic     Dysphagia Treatment   Temperature Spikes Noted No   Respiratory Status Room air   Oral Cavity - Dentition Adequate natural dentition   Treatment Methods Skilled observation;Upgraded PO texture trial;Therapeutic exercise;Compensation strategy training;Patient/caregiver education   Patient observed directly with PO's Yes   Type of PO's observed Dysphagia 1 (puree);Thin liquids   Feeding Needs assist;Total assist   Liquids provided via Cup   Oral Phase Signs & Symptoms Anterior loss/spillage   Pharyngeal Phase Signs & Symptoms Audible swallow;Multiple swallows   Type of cueing Verbal;Visual   Amount of cueing Moderate     Pain Assessment   Pain Assessment No/denies pain     Cognitive-Linquistic Treatment   Treatment focused on Dysarthria;Voice   Skilled Treatment SLP required pt to make verbal choice between applesauce vs water - pt req'd min-mod A to request.  Pt practiced timing of breath-voice sequence with responses to questions about foods eaten this week, short phrases of 3-5 words 95% success with mod-max verbal and visual cues for breath-voice timing, volume, and breath pacing.     Tracheostomy  Shiley 4 mm Uncuffed   Tracheostomy Properties Placement Date: 02/24/16 Placement Time: 0927 Placed By: ICU physician Brand: Shiley Size (mm): 4 mm Style: Uncuffed   Status Capped;Secured   Site Assessment Clean;Dry   Ties Assessment Clean;Dry;Secure   Cuff pressure (cm) 0 cm     Assessment / Recommendations / Plan   Plan Continue with current plan of care     Dysphagia Recommendations   Diet recommendations Other(comment)  4-6 oz feeds of liquids, pureed and dys 2 solids   Liquids provided via Teaspoon;Cup   Medication Administration Via alternative means   Supervision Full supervision/cueing for  compensatory strategies;Trained caregiver to feed patient   Compensations Effortful swallow;Multiple dry swallows after each bite/sip;Check for anterior loss;Small sips/bites;Slow rate   Postural Changes and/or Swallow Maneuvers Seated upright 90 degrees     General Recommendations   Oral Care Recommendations Oral care before and after PO;Staff/trained caregiver to provide oral care   Follow up Recommendations Outpatient SLP     Progression Toward Goals   Progression toward goals Progressing toward goals          SLP Education - 04/01/16 1601    Education provided Yes   Education Details limit intake, monitor for signs of aspiration/fatigue   Person(s) Educated Patient;Spouse;Caregiver(s)   Methods Explanation;Demonstration   Comprehension Verbalized understanding;Returned demonstration          SLP Short Term Goals - 04/01/16 1609      SLP SHORT TERM GOAL #1   Title Patient will utilize adequate breath support, slow rate and visual cues from communication partner/AAC to communicate wants, needs, thoughts and ideas.   Baseline 2/19   Time 2   Period Weeks   Status On-going     SLP SHORT TERM GOAL #2   Title Patient will utilize intelligibility strategies, breath support with mod A for phrase repetition in 75% of trials.   Baseline 2.12.18   Time 2   Period Weeks     SLP SHORT TERM GOAL #3   Title Pt will swallow thin liquids, purees and soft solids in 9/10 trials with no overt signs of aspiration.   Status Achieved     SLP SHORT TERM GOAL #4   Title Patient will tolerate 8oz pleasure feeds with no overt signs of aspiration.   Time 2   Period Weeks   Status New          SLP Long Term Goals - 04/01/16 1611      SLP LONG TERM GOAL #1   Title Patient will utilize multimodal communication/AAC to clarify wants, needs, thoughts or ideas during communication breakdowns with max A   Time 2   Period Weeks   Status On-going     SLP LONG TERM GOAL #3   Title  Patient will demonstrate readiness for MBS to determine safety for pleasure feeds by timely initiation of swallow in 3/5 trials with ice chips/H20.   Status Achieved     SLP LONG TERM GOAL #4   Title Patient will tolerate pleasure feeds of 8 oz thin liquids, purees or soft solids.   Time 2   Period Weeks   Status On-going          Plan - 04/01/16 1602    Clinical Impression Statement Patient with sustained alertness and attention to session tasks, remained alert for the entire session. Swallow initiation appears timely for thin liquids, pureed solids in 90% of trials. Patient with increase in swallow initiation time as trials progressed.  Airway protection appears adequate for comfort feeding session of approximately 20 minutes with short rest breaks between boluses. Provided verbal recommendation to caregiver and husband to limit amount of intake, length of feeding sessions and monitor for signs of aspiration/fatigue. Suggested small comfort feeds of uniform textures including thin liquids, smoothies, purees and mechanical soft solids (scrambled eggs, soft, unhulled vegetables, cottage cheese, soft noodles). Recommend skilled ST in order to maximize functional communication, swallowing safety, reduce caregiver burden and improve quality of life.   Speech Therapy Frequency 2x / week   Duration Other (comment)   Treatment/Interventions Aspiration precaution training;Trials of upgraded texture/liquids;Compensatory strategies;Functional tasks;Patient/family education;Cueing hierarchy;Diet toleration management by SLP;Cognitive reorganization;Compensatory techniques;SLP instruction and feedback;Internal/external aids;Multimodal communcation approach   Potential to Achieve Goals Good   Potential Considerations Ability to learn/carryover information;Co-morbidities;Severity of impairments   SLP Home Exercise Plan Comfort feeds of 4-6 oz following oral care, continue utilizing multimodal techniques to  improve relay of information, functional phrases   Consulted and Agree with Plan of Care Family member/caregiver;Patient      Patient will benefit from skilled therapeutic intervention in order to improve the following deficits and impairments:   Dysphagia, oropharyngeal phase  Dysarthria and anarthria  Cognitive communication deficit    Problem List Patient Active Problem List   Diagnosis Date Noted  . Heterotopic ossification of bone 03/10/2016  . Acute on chronic respiratory failure with hypoxia ([place])   . Acute hypoxemic respiratory failure ([place])   . Acute blood loss anemia   . Atelectasis 02/04/2016  . Debility 02/03/2016  . Pneumonia of both lower lobes due to Pseudomonas species ([place])   . Pneumonia of both lower lobes due to methicillin susceptible Staphylococcus aureus (MSSA) ([place])   . Acute respiratory failure with hypoxia ([place])   . Acute encephalopathy   . Seizures ([place])   . Sepsis ([place])   . Hypoxia   . Pain   . HCAP (healthcare-associated pneumonia) 12/28/2015  . Chronic respiratory failure ([place]) 12/28/2015  . Acute tracheobronchitis 12/24/2015  . Tracheostomy tube present ([place])   . Tracheal stenosis   . Chronic respiratory failure with hypoxia ([place])   . 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[place]) 10/20/2015  . Tracheostomy status ([place]) 09/26/2015  . Allergic rhinitis 09/26/2015  . 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 Movement disorder 09/22/2015  . Encephalitis 09/22/2015  . Chest pain 02/07/2014  . Premature atrial contractions 01/13/2014  . Abnormality of gait 12/04/2013  . Hyperlipidemia 12/21/2011  . Cardiovascular risk factor 12/21/2011  . Bunion 01/31/2008  . Metatarsalgia of both feet 09/20/2007  . FLAT FOOT 09/20/2007  . HALLUX RIGIDUS, ACQUIRED 09/20/2007   Deneise Lever, [place] CF-SLP Speech-Language Pathologist  Aliene Altes 04/01/2016, 5:18 PM  [facility] [address] [place], [place],  [address] Phone: [phone]   Fax:  [phone]   Name: RONEISHA STERN MRN: [id number] Date of Birth: [birth year]

## 2016-04-01 NOTE — Therapy (Signed)
[facility] [address] [place], [place], [address] Phone: [phone]   Fax:  [phone]  Occupational Therapy Treatment  Patient Details  Name: Judy Spears MRN: [id number] Date of Birth: [birth year] Referring Provider: Dr. Quita Skye. [name]  Encounter Date: 04/01/2016      OT End of Session - 04/01/16 1555    Visit Number 9   Number of Visits 16   Date for OT Re-Evaluation 04/26/16   Authorization Type BCBS unlimited visits   OT Start Time 1316   OT Stop Time 1400   OT Time Calculation (min) 44 min   Activity Tolerance Patient tolerated treatment well      Past Medical History:  Diagnosis Date  . Arthritis    lt great toe  . Complication of anesthesia    pt has had headaches post op-did advise to hydra well preop  . Hair loss    right-sided  . History of exercise stress test    a. ETT (12/15) with 12:00 exercise, no ST changes, occasional PACs.   . Hyperlipidemia   . Insomnia   . Migraine headache   . 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 Postmenopausal   . Premature atrial contractions    a. Holter (12/15) with 8% PACs, no atrial runs or atrial fibrillation.     Past Surgical History:  Procedure Laterality Date  . BREAST BIOPSY  01/01/2011   Procedure: BREAST BIOPSY WITH NEEDLE LOCALIZATION;  Surgeon: Rolm Bookbinder, MD;  Location: [facility];  Service: General;  Laterality: Left;  left breast wire localization biopsy  . cataracts right eye    . CESAREAN SECTION  1986  . HERNIA REPAIR  2000   RIH  . opirectinal membrane peel    . RHINOPLASTY  1983    There were no vitals filed for this visit.      Subjective Assessment - 04/01/16 1321    Subjective  I like those (adapted feeding utensils)   Patient is accompained by: --  lisa caregiver   Pertinent History see epic snapshot - ABI/encephalitis/hypoxia from virus; recent hospitalization due to ARF;    Patient  Stated Goals Pt unable to state.    Currently in Pain? No/denies                      OT Treatments/Exercises (OP) - 04/01/16 0001      ADLs   Eating Practiced using RUE in more functional task of self feeding now that pt has passed swallowing test.  Pt with improved independence using angled spoon/fork with built up grips.  When attempting to use RUE pt goes into extensor synergy pattern, however if pt is first positioned with shoulder depression and elbow flexion, tone relaxes.  If pt is prevented from shoulder hiking and helper assists with plate to mouth pt is able to self feed with RUE with mod assist.  Demonstrated positioning to caregiver and caregiver able to return demonstrate using both spoon and fork  ALso demonstrated use of nosey cup and same strategy and again caregiever able to return demonstate.  Husband observed and will order adapted equipment for self feeding at home.    ADL Comments Long discussion with husband [name] and caregiver [name] regarding options for bathroom.  Discussed that long term if pt's goal is to be more independent in bathing that there has to be a way for pt to feel more posturally secure.  Also  discussed the need long term to decrease caregiver burden as currently it takes two caregivers to assist pt with showering.  Pt's bathroom has walk in shower.  Dicussed consulting with professional to determine if this could be converted into a zero entry shower or the cost of installing one.  This would allow the pt to use roll in shower chair that would provide much more support for postural control. DIscussed that given that showers are an easy place for falls would anticipate that pt would need to sit likely permanently to bathe - husband in agreement and also verbalize agreement regarding altering shower. Husband to pursue investigating this option.                 OT Education - 04/01/16 1547    Education provided Yes   Education Details AE  equipment and RUE positionin/caregiver assist to allow pt to self feed with mod a   Person(s) Educated Patient;Spouse;Caregiver(s)   Methods Explanation;Demonstration;Verbal cues;Tactile cues   Comprehension Verbalized understanding;Returned demonstration  caregiver returned demonstrated          OT Short Term Goals - 04/01/16 1548      OT SHORT TERM GOAL #1   Title Pt, husband and cargiver will be mod I with HEP - 03/29/2016   Status Achieved     OT SHORT TERM GOAL #2   Title Pt will be able to sit with supervision without UE support and vc's to assist with midline 75% while engage in functional task   Status Achieved     OT SHORT TERM GOAL #3   Title Pt will be able to complete sit to stand consistently with mod A for toilet transfers (max a for pivot)   Status Achieved     OT SHORT TERM GOAL #4   Title Pt, husband and caregiver will verbalize understanding of various bathroom accomodations to reduce long term caregiver burden   Status Achieved           OT Long Term Goals - 04/01/16 [place] #1   Title Pt, husband and caregiver will be mod I with upgraded HEP prn - 04/26/2016   Status On-going     OT LONG TERM GOAL #2   Title Pt, husband and caregiver will be mod I with splint wear and care    Status Achieved     OT LONG TERM GOAL #3   Title Pt will be able to wash face with min a only   Status On-going     OT LONG TERM GOAL #4   Title Pt will be able to sit without UE support with supevision 100% of the time with no more than 2 vc's while engaged in functional activity.   Status Achieved     OT LONG TERM GOAL #5   Title Pt, husband and caregiver will verbalize understanding of potential after care therapeutic opportunities.    Status On-going               Plan - 04/01/16 1550    Clinical Impression Statement Pt progressing toward goals.  Pt with improved trunk control, alignment and balance to begin to address functional use of UE's    Rehab Potential Fair   Clinical Impairments Affecting Rehab Potential severity of deficits, very slow rate of recovery   OT Frequency 2x / week   OT Duration 8 weeks   OT Treatment/Interventions Self-care/ADL training;Ultrasound;Moist Heat;Electrical Stimulation;DME and/or AE instruction;Neuromuscular education;Therapeutic  exercise;Functional Mobility Training;Manual Therapy;Passive range of motion;Splinting;Therapeutic activities;Balance training;Patient/family education;Visual/perceptual remediation/compensation;Cognitive remediation/compensation   Plan functional use of UE's, NMR for UE's/trunk/LE's, static to dynamic standing balance.    Consulted and Agree with Plan of Care Patient;Family member/caregiver   Family Member Consulted caregiver [name], husband [name]       Patient will benefit from skilled therapeutic intervention in order to improve the following deficits and impairments:  Decreased endurance, Decreased skin integrity, Impaired vision/preception, Improper body mechanics, Impaired perceived functional ability, Decreased knowledge of precautions, Cardiopulmonary status limiting activity, Decreased activity tolerance, Decreased knowledge of use of DME, Decreased strength, Impaired flexibility, Improper spinal/pelvic alignment, Impaired sensation, Difficulty walking, Decreased mobility, Decreased balance, Decreased cognition, Decreased range of motion, Impaired tone, Impaired UE functional use, Decreased safety awareness, Decreased coordination, Pain  Visit Diagnosis: Muscle weakness (generalized)  Unsteadiness on feet  Abnormal posture  Other symptoms and signs involving the nervous system  Stiffness of left shoulder, not elsewhere classified  Stiffness of right shoulder, not elsewhere classified  Stiffness of left elbow, not elsewhere classified  Frontal lobe and executive function deficit    Problem List Patient Active Problem List   Diagnosis Date Noted  .  Heterotopic ossification of bone 03/10/2016  . Acute on chronic respiratory failure with hypoxia ([place])   . Acute hypoxemic respiratory failure ([place])   . Acute blood loss anemia   . Atelectasis 02/04/2016  . Debility 02/03/2016  . Pneumonia of both lower lobes due to Pseudomonas species ([place])   . Pneumonia of both lower lobes due to methicillin susceptible Staphylococcus aureus (MSSA) ([place])   . Acute respiratory failure with hypoxia ([place])   . Acute encephalopathy   . Seizures ([place])   . Sepsis ([place])   . Hypoxia   . Pain   . HCAP (healthcare-associated pneumonia) 12/28/2015  . Chronic respiratory failure ([place]) 12/28/2015  . Acute tracheobronchitis 12/24/2015  . Tracheostomy tube present ([place])   . Tracheal stenosis   . Chronic respiratory failure with hypoxia ([place])   . 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[place]) 10/20/2015  . Tracheostomy status ([place]) 09/26/2015  . Allergic rhinitis 09/26/2015  . 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 Movement disorder 09/22/2015  . Encephalitis 09/22/2015  . Chest pain 02/07/2014  . Premature atrial contractions 01/13/2014  . Abnormality of gait 12/04/2013  . Hyperlipidemia 12/21/2011  . Cardiovascular risk factor 12/21/2011  . Bunion 01/31/2008  . Metatarsalgia of both feet 09/20/2007  . FLAT FOOT 09/20/2007  . HALLUX RIGIDUS, ACQUIRED 09/20/2007    Quay Burow, OTR/L 04/01/2016, 4:00 PM  [facility] [address] [place], [place], [address] Phone: [phone]   Fax:  [phone]  Name: Judy Spears MRN: [id number] Date of Birth: Nov 22, 1950

## 2016-04-01 NOTE — Telephone Encounter (Signed)
Order placed for MBS performed 03/19/16

## 2016-04-02 NOTE — Therapy (Signed)
[facility] [address] [place], [place], [address] Phone: [phone]   Fax:  [phone]  Physical Therapy Treatment  Patient Details  Name: Judy Spears MRN: [id number] Date of Birth: Jun 03, 1950 Referring Provider: Alger Simons, MD/Andrew Letta Pate, MD  Encounter Date: 04/01/2016      PT End of Session - 04/02/16 1546    Visit Number 10   Number of Visits 20   Date for PT Re-Evaluation 04/30/16   Authorization Type BCBS   PT Start Time 6629   PT Stop Time 4765   PT Time Calculation (min) 44 min      Past Medical History:  Diagnosis Date  . Arthritis    lt great toe  . Complication of anesthesia    pt has had headaches post op-did advise to hydra well preop  . Hair loss    right-sided  . History of exercise stress test    a. ETT (12/15) with 12:00 exercise, no ST changes, occasional PACs.   . Hyperlipidemia   . Insomnia   . Migraine headache   . 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 Postmenopausal   . Premature atrial contractions    a. Holter (12/15) with 8% PACs, no atrial runs or atrial fibrillation.     Past Surgical History:  Procedure Laterality Date  . BREAST BIOPSY  01/01/2011   Procedure: BREAST BIOPSY WITH NEEDLE LOCALIZATION;  Surgeon: Rolm Bookbinder, MD;  Location: [facility];  Service: General;  Laterality: Left;  left breast wire localization biopsy  . cataracts right eye    . CESAREAN SECTION  1986  . HERNIA REPAIR  2000   RIH  . opirectinal membrane peel    . RHINOPLASTY  1983    There were no vitals filed for this visit.      Subjective Assessment - 04/02/16 1534    Subjective No new complaints or problems reported by husband or caregiver   Patient is accompained by: Family member   Pertinent History Precautions:  PEG.  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  Another round of botox in LLE on 02/06/16   Patient Stated Goals Per husband, "For [place] to be as independent as possible."   Currently in Pain? No/denies                         [facility] Adult PT Treatment/Exercise - 04/02/16 0001      Transfers   Transfers Sit to Stand   Sit to Stand 4: Min assist   Sit to Stand Details Tactile cues for sequencing;Verbal cues for technique;Tactile cues for posture;Tactile cues for weight shifting   Stand to Sit 3: Mod assist   Stand to Sit Details min assist to flex hips for sitting with eccentric control   Stand Pivot Transfers 3: Mod assist;4: Min assist     Ambulation/Gait   Ambulation/Gait Yes   Ambulation/Gait Assistance Other (comment)   Ambulation Distance (Feet) 60 Feet   Assistive device Rolling walker   Gait Pattern Decreased dorsiflexion - right;Decreased hip/knee flexion - right;Right flexed knee in stance;Left flexed knee in stance;Right foot flat;Left foot flat;Scissoring;Lateral trunk lean to left;Decreased weight shift to right;Narrow base of support;Poor foot clearance - right;Decreased trunk rotation   Ambulation Surface Level;Indoor  Exercises   Exercises Knee/Hip     Knee/Hip Exercises: Aerobic   Recumbent Bike SciFit 5" level 1.5 - assistance given to hold LUE on handle     Pt gait trained inside // bars 10' x 1, then 10' x 2 with turning inside bars with max assist; pt required tactile and verbal cues to Stay upright; tactile cues with mod assist given for Lt trunk elongation during gait training (hand placement under arm in axilla)             PT Short Term Goals - 03/30/16 2105      PT SHORT TERM GOAL #1   Title Pt will consistently perform supine rolling in B directions with min A (with rails as needed) in order to indicate increased trunk dissociation, increased independence with bed  mobility.  (Target date for all STGs: 03/29/16)   Baseline rolled to R with CGA to min assist on 03-30-16; min assist from supine to Lt sidelying   Status Achieved     PT SHORT TERM GOAL #2   Title Pt will consistently perform supine <> sit with min A to indicate increased independence getting out of bed.    Baseline min to mod assist needed on 03-30-16   Status On-going     PT SHORT TERM GOAL #3   Title Pt will demonstrate midline posture in static sitting with BLE support and RUE support at a S level to indicate increased postural awareness/control.     Baseline met 03-30-16   Status Achieved     PT SHORT TERM GOAL #4   Title Pt will consistently perform functional transfers from w/c <> mat table with mod A to decrease caregiver burden.     Baseline mod assist with stand pivot transfers - 03-30-16   Status Achieved     PT SHORT TERM GOAL #5   Title Pt will consistently perform sit <> stand from personal w/c with BUE support and mod A to decrease caregiver burden.     Baseline met 03-30-16   Status Achieved     PT SHORT TERM GOAL #6   Title Pt will perform static standing with BUE support x1 minute with supervision while in stedy, no overt LOB to decrease caregiver burden.    Baseline pt able to stand unsupported with mod to min assist - not using Stedy in PT  (03-30-16)   Status Achieved           PT Long Term Goals - 03/01/16 1641      PT LONG TERM GOAL #1   Title Pt will perform supine <> sit with supervision 75% of the time to indicate increased independence getting out of bed.   (Target for all LTGs: 04/26/16)   Time 8   Period Weeks   Status New     PT LONG TERM GOAL #2   Title Pt will demonstrate midline posture in static sitting with BLE support without UE support to indicate increased postural awareness/control.    Time 8   Period Weeks   Status New     PT LONG TERM GOAL #3   Title Pt will consistently perform functional transfers at min A level 75% of the time to decrease  caregiver burden.     Time 8   Period Weeks   Status New     PT LONG TERM GOAL #4   Title Pt will perform sit <> stand from personal w/c with BUE support and min A, 75% of  time to decrease caregiver burden.     Time 8   Period Weeks   Status New     PT LONG TERM GOAL #5   Title Pt will demonstrate ability to ambulate up to 5' w/ LRAD with max A in order to better assist with getting to/from restroom to decrease burden of care.    Time 8   Period Weeks   Status New               Plan - 04/02/16 1547    Clinical Impression Statement Pt amb. inside // bars and with RW for 1st time today - pt improving with sit to stand transfers and with gait, however, +2 assist still needed with gait training.   Rehab Potential Fair   Clinical Impairments Affecting Rehab Potential Chronicity of condition, continued medical management, but has very strong family and financial support   PT Frequency 2x / week   PT Duration 8 weeks   PT Treatment/Interventions ADLs/Self Care Home Management;DME Instruction;Gait training;Stair training;Functional mobility training;Therapeutic activities;Patient/family education;Cognitive remediation;Neuromuscular re-education;Therapeutic exercise;Balance training;Orthotic Fit/Training;Wheelchair mobility training;Manual techniques;Splinting;Visual/perceptual remediation/compensation;Vestibular;Passive range of motion;Electrical Stimulation   PT Next Visit Plan cont gait and standing balance   Consulted and Agree with Plan of Care Patient;Family member/caregiver   Family Member Consulted caregiver, [name] and husband [name]      Patient will benefit from skilled therapeutic intervention in order to improve the following deficits and impairments:  Abnormal gait, Decreased balance, Decreased endurance, Decreased cognition, Cardiopulmonary status limiting activity, Decreased activity tolerance, Decreased coordination, Decreased strength, Impaired flexibility, Impaired tone,  Decreased mobility, Increased muscle spasms, Impaired vision/preception, Decreased range of motion, Impaired UE functional use, Postural dysfunction, Improper body mechanics, Impaired perceived functional ability, Decreased knowledge of precautions, Decreased knowledge of use of DME  Visit Diagnosis: Other abnormalities of gait and mobility  Muscle weakness (generalized)     Problem List Patient Active Problem List   Diagnosis Date Noted  . Heterotopic ossification of bone 03/10/2016  . Acute on chronic respiratory failure with hypoxia ([place])   . Acute hypoxemic respiratory failure ([place])   . Acute blood loss anemia   . Atelectasis 02/04/2016  . Debility 02/03/2016  . Pneumonia of both lower lobes due to Pseudomonas species ([place])   . Pneumonia of both lower lobes due to methicillin susceptible Staphylococcus aureus (MSSA) ([place])   . Acute respiratory failure with hypoxia ([place])   . Acute encephalopathy   . Seizures ([place])   . Sepsis ([place])   . Hypoxia   . Pain   . HCAP (healthcare-associated pneumonia) 12/28/2015  . Chronic respiratory failure ([place]) 12/28/2015  . Acute tracheobronchitis 12/24/2015  . Tracheostomy tube present ([place])   . Tracheal stenosis   . Chronic respiratory failure with hypoxia ([place])   . 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[place]) 10/20/2015  . Tracheostomy status ([place]) 09/26/2015  . Allergic rhinitis 09/26/2015  . 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 Movement disorder 09/22/2015  . Encephalitis 09/22/2015  . Chest pain 02/07/2014  . Premature atrial contractions 01/13/2014  . Abnormality of gait 12/04/2013  . Hyperlipidemia 12/21/2011  . Cardiovascular risk factor 12/21/2011  . Bunion 01/31/2008  . Metatarsalgia of both feet 09/20/2007  . FLAT FOOT 09/20/2007  . HALLUX RIGIDUS, ACQUIRED 09/20/2007    Alda Lea, PT 04/02/2016, 3:52 PM  [facility] [address] [place], [place], [address] Phone:  [phone]   Fax:  [phone]  Name: Judy Spears MRN: [id number] Date of Birth: 01-17-1951

## 2016-04-05 ENCOUNTER — Ambulatory Visit: Payer: BC Managed Care – PPO | Admitting: Occupational Therapy

## 2016-04-05 ENCOUNTER — Ambulatory Visit: Payer: BC Managed Care – PPO | Admitting: Physical Therapy

## 2016-04-05 ENCOUNTER — Encounter: Payer: Self-pay | Admitting: Physical Therapy

## 2016-04-05 ENCOUNTER — Encounter: Payer: Self-pay | Admitting: Occupational Therapy

## 2016-04-05 ENCOUNTER — Ambulatory Visit: Payer: BC Managed Care – PPO | Admitting: Speech Pathology

## 2016-04-05 DIAGNOSIS — R2681 Unsteadiness on feet: Secondary | ICD-10-CM

## 2016-04-05 DIAGNOSIS — R41844 Frontal lobe and executive function deficit: Secondary | ICD-10-CM

## 2016-04-05 DIAGNOSIS — M25612 Stiffness of left shoulder, not elsewhere classified: Secondary | ICD-10-CM

## 2016-04-05 DIAGNOSIS — R471 Dysarthria and anarthria: Secondary | ICD-10-CM

## 2016-04-05 DIAGNOSIS — M25611 Stiffness of right shoulder, not elsewhere classified: Secondary | ICD-10-CM

## 2016-04-05 DIAGNOSIS — R1312 Dysphagia, oropharyngeal phase: Secondary | ICD-10-CM

## 2016-04-05 DIAGNOSIS — M25622 Stiffness of left elbow, not elsewhere classified: Secondary | ICD-10-CM

## 2016-04-05 DIAGNOSIS — R2689 Other abnormalities of gait and mobility: Secondary | ICD-10-CM

## 2016-04-05 DIAGNOSIS — M6281 Muscle weakness (generalized): Secondary | ICD-10-CM

## 2016-04-05 DIAGNOSIS — R41841 Cognitive communication deficit: Secondary | ICD-10-CM

## 2016-04-05 DIAGNOSIS — R29818 Other symptoms and signs involving the nervous system: Secondary | ICD-10-CM

## 2016-04-05 DIAGNOSIS — R293 Abnormal posture: Secondary | ICD-10-CM

## 2016-04-05 NOTE — Therapy (Signed)
[facility] [address] [place], [place], [address] Phone: [phone]   Fax:  [phone]  Occupational Therapy Treatment  Patient Details  Name: Judy Spears MRN: [id number] Date of Birth: [birth year] Referring Provider: Dr. Quita Skye. [name]  Encounter Date: 04/05/2016      OT End of Session - 04/05/16 1317    Visit Number 10   Number of Visits 16   Date for OT Re-Evaluation 04/26/16   Authorization Type [employer] unlimited visits   OT Start Time 1220   OT Stop Time 1301   OT Time Calculation (min) 41 min   Activity Tolerance Patient tolerated treatment well      Past Medical History:  Diagnosis Date  . Arthritis    lt great toe  . Complication of anesthesia    pt has had headaches post op-did advise to hydra well preop  . Hair loss    right-sided  . History of exercise stress test    a. ETT (12/15) with 12:00 exercise, no ST changes, occasional PACs.   . Hyperlipidemia   . Insomnia   . Migraine headache   . 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 Postmenopausal   . Premature atrial contractions    a. Holter (12/15) with 8% PACs, no atrial runs or atrial fibrillation.     Past Surgical History:  Procedure Laterality Date  . BREAST BIOPSY  01/01/2011   Procedure: BREAST BIOPSY WITH NEEDLE LOCALIZATION;  Surgeon: Rolm Bookbinder, MD;  Location: [facility];  Service: General;  Laterality: Left;  left breast wire localization biopsy  . cataracts right eye    . CESAREAN SECTION  1986  . HERNIA REPAIR  2000   RIH  . opirectinal membrane peel    . RHINOPLASTY  1983    There were no vitals filed for this visit.      Subjective Assessment - 04/05/16 1313    Subjective  I like that (when working on using RUE)   Pertinent History see epic snapshot - ABI/encephalitis/hypoxia from virus; recent hospitalization due to ARF;    Patient Stated Goals Pt unable to state.    Currently in  Pain? No/denies                      OT Treatments/Exercises (OP) - 04/05/16 0001      Neurological Re-education Exercises   Other Exercises 1 Neuro re ed to address functional ambulation ( 2 person assist at this time with walker0, static standing balance, finding midline, transitional movements and stand to sit.  Also addressed RUE functional reach in supine to allow pt to relax abnormal holding posture through head and RUE. Pt is able to actively release severe shoulder hike that she uses for postural stability in sitting and standing.  Mod - max facilitation initially then later in session min facilitation to take off glasses in sitting using RUE.                   OT Short Term Goals - 04/05/16 1315      OT SHORT TERM GOAL #1   Title Pt, husband and cargiver will be mod I with HEP - 03/29/2016   Status Achieved     OT SHORT TERM GOAL #2   Title Pt will be able to sit with supervision without UE support and vc's to assist with midline 75% while engage in functional task   Status Achieved  OT SHORT TERM GOAL #3   Title Pt will be able to complete sit to stand consistently with mod A for toilet transfers (max a for pivot)   Status Achieved     OT SHORT TERM GOAL #4   Title Pt, husband and caregiver will verbalize understanding of various bathroom accomodations to reduce long term caregiver burden   Status Achieved           OT Long Term Goals - 04/05/16 1316      OT LONG TERM GOAL #1   Title Pt, husband and caregiver will be mod I with upgraded HEP prn - 04/26/2016   Status On-going     OT LONG TERM GOAL #2   Title Pt, husband and caregiver will be mod I with splint wear and care    Status Achieved     OT LONG TERM GOAL #3   Title Pt will be able to wash face with min a only   Status On-going     OT LONG TERM GOAL #4   Title Pt will be able to sit without UE support with supevision 100% of the time with no more than 2 vc's while engaged in  functional activity.   Status Achieved     OT LONG TERM GOAL #5   Title Pt, husband and caregiver will verbalize understanding of potential after care therapeutic opportunities.    Status On-going               Plan - 04/05/16 1316    Clinical Impression Statement Pt progressing toward goals. Pt beginning to work on self feeding and the abilty to bring R hand to face   Rehab Potential Fair   Clinical Impairments Affecting Rehab Potential severity of deficits, very slow rate of recovery   OT Frequency 2x / week   OT Duration 8 weeks   OT Treatment/Interventions Self-care/ADL training;Ultrasound;Moist Heat;Electrical Stimulation;DME and/or AE instruction;Neuromuscular education;Therapeutic exercise;Functional Mobility Training;Manual Therapy;Passive range of motion;Splinting;Therapeutic activities;Balance training;Patient/family education;Visual/perceptual remediation/compensation;Cognitive remediation/compensation   Plan functional use of UE's, NMR for UE's.trunk, LE's, static to dynamic standing balance, functional mobility   Consulted and Agree with Plan of Care Patient;Family member/caregiver   Family Member Consulted caregiver [name], husband [name]       Patient will benefit from skilled therapeutic intervention in order to improve the following deficits and impairments:  Decreased endurance, Decreased skin integrity, Impaired vision/preception, Improper body mechanics, Impaired perceived functional ability, Decreased knowledge of precautions, Cardiopulmonary status limiting activity, Decreased activity tolerance, Decreased knowledge of use of DME, Decreased strength, Impaired flexibility, Improper spinal/pelvic alignment, Impaired sensation, Difficulty walking, Decreased mobility, Decreased balance, Decreased cognition, Decreased range of motion, Impaired tone, Impaired UE functional use, Decreased safety awareness, Decreased coordination, Pain  Visit Diagnosis: Muscle weakness  (generalized)  Unsteadiness on feet  Abnormal posture  Other symptoms and signs involving the nervous system  Stiffness of left shoulder, not elsewhere classified  Stiffness of right shoulder, not elsewhere classified  Stiffness of left elbow, not elsewhere classified  Frontal lobe and executive function deficit    Problem List Patient Active Problem List   Diagnosis Date Noted  . Heterotopic ossification of bone 03/10/2016  . Acute on chronic respiratory failure with hypoxia ([facility])   . Acute hypoxemic respiratory failure ([facility])   . Acute blood loss anemia   . Atelectasis 02/04/2016  . Debility 02/03/2016  . Pneumonia of both lower lobes due to Pseudomonas species ([facility])   . Pneumonia of both lower lobes due to  methicillin susceptible Staphylococcus aureus (MSSA) ([place] [facility])   . Acute respiratory failure with hypoxia ([facility])   . Acute encephalopathy   . Seizures ([facility])   . Sepsis ([facility])   . Hypoxia   . Pain   . HCAP (healthcare-associated pneumonia) 12/28/2015  . Chronic respiratory failure ([facility]) 12/28/2015  . Acute tracheobronchitis 12/24/2015  . Tracheostomy tube present ([facility])   . Tracheal stenosis   . Chronic respiratory failure with hypoxia ([place] [facility])   . 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 Tracheostomy status ([facility]) 09/26/2015  . Allergic rhinitis 09/26/2015  . 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 Movement disorder 09/22/2015  . Encephalitis 09/22/2015  . Chest pain 02/07/2014  . Premature atrial contractions 01/13/2014  . Abnormality of gait 12/04/2013  . Hyperlipidemia 12/21/2011  . Cardiovascular risk factor 12/21/2011  . Bunion 01/31/2008  . Metatarsalgia of both feet 09/20/2007  . FLAT FOOT 09/20/2007  . HALLUX RIGIDUS, ACQUIRED 09/20/2007    Quay Burow, OTR/L 04/05/2016, 1:18 PM  [facility] [address] [place], [place], [address] Phone: [phone]   Fax:  [phone]  Name: Judy Spears MRN: [id number] Date of Birth: Jul 31, 1950

## 2016-04-05 NOTE — Therapy (Signed)
[facility] [address] [place], [place], [address] Phone: [phone]   Fax:  [phone]  Speech Language Pathology Treatment  Patient Details  Name: Judy Spears MRN: [id number] Date of Birth: [birth year] Referring Provider: Charlett Blake, MD  Encounter Date: 04/05/2016      End of Session - 04/05/16 1135    Visit Number 11   Number of Visits 17   Date for SLP Re-Evaluation 04/30/16   SLP Start Time 1017   SLP Stop Time  1103   SLP Time Calculation (min) 46 min   Activity Tolerance Patient tolerated treatment well      Past Medical History:  Diagnosis Date  . Arthritis    lt great toe  . Complication of anesthesia    pt has had headaches post op-did advise to hydra well preop  . Hair loss    right-sided  . History of exercise stress test    a. ETT (12/15) with 12:00 exercise, no ST changes, occasional PACs.   . Hyperlipidemia   . Insomnia   . Migraine headache   . 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 Postmenopausal   . Premature atrial contractions    a. Holter (12/15) with 8% PACs, no atrial runs or atrial fibrillation.     Past Surgical History:  Procedure Laterality Date  . BREAST BIOPSY  01/01/2011   Procedure: BREAST BIOPSY WITH NEEDLE LOCALIZATION;  Surgeon: Rolm Bookbinder, MD;  Location: [facility];  Service: General;  Laterality: Left;  left breast wire localization biopsy  . cataracts right eye    . CESAREAN SECTION  1986  . HERNIA REPAIR  2000   RIH  . opirectinal membrane peel    . RHINOPLASTY  1983    There were no vitals filed for this visit.      Subjective Assessment - 04/05/16 1111    Subjective "Judy Spears"   Patient is accompained by: Family member   Special Tests spouse, Judy Spears; caregiver Judy Spears   Currently in Pain? No/denies               ADULT SLP TREATMENT - 04/05/16 0001      General  Information   Behavior/Cognition Alert;Cooperative;Pleasant mood   Patient Positioning Upright in chair   Oral care provided --  Husband provided immediately prior to arrival     Treatment Provided   Treatment provided Dysphagia;Cognitive-Linquistic     Dysphagia Treatment   Temperature Spikes Noted No   Respiratory Status Room air   Oral Cavity - Dentition Adequate natural dentition   Treatment Methods Skilled observation;Upgraded PO texture trial;Therapeutic exercise;Compensation strategy training;Patient/caregiver education   Patient observed directly with PO's Yes   Type of PO's observed Dysphagia 1 (puree);Dysphagia 2 (chopped);Thin liquids   Feeding Needs assist;Total assist   Liquids provided via Cup   Oral Phase Signs & Symptoms Anterior loss/spillage;Prolonged bolus formation   Pharyngeal Phase Signs & Symptoms Suspected delayed swallow initiation;Audible swallow;Immediate cough;Multiple swallows   Type of cueing Verbal;Visual   Amount of cueing Moderate   Other treatment/comments Discussed status of pleasure feedings at home; husband describes small quantities (less than 4oz) of pureed and finely chopped mechanical solids with thin liquids at meals. Patient noted with ample saliva today. General mouth condition is greatly improved, no coating of lingual surface. With increase in saliva, patient noted with anterior secretion loss, benefits from  verbal cues to swallow. Provided trials of mechanical soft solid. Patient with prolonged bolus formation, diffuse residue, bilaterally along posterior aspect of tongue and palate. Required verbal cues for multiple swallows, ultimately cleared residue with liquid wash. Presence of residue appeared to decrease coordination with thin liquids; patient with appearance of delayed swallow initiation, cough x1, suggestive of decreased airway protection. Suspect premature spillage. With purees, no oral residue, and liquid wash with appearance of timely  swallow, clear vocal quality. Notably, patient requires cues to swallow between boluses to manage increased secretions. Provided education to patient, family and caregiver; recommended pleasure feeds of purees and liquids only, avoiding solids due to observed difficulty with bolus formation today. In addition, recommended verbal cuing strategies for management of secretions between boluses, as well as for general management of secretions at other times of the day. Patient responds promptly to verbal cues to "close your mouth, push your tongue up and swallow."     Pain Assessment   Pain Assessment No/denies pain     Cognitive-Linquistic Treatment   Treatment focused on Dysarthria;Voice   Skilled Treatment Targeted speech intelligibility, breath support and pacing, and overarticulation with functional phrases of 2-7 words. Initially provided visual support with depiction of breath wave for each word. Given initial instruction and demonstration, patient implements techniques with min cues for loudness. Articulation improves with verbal cue to swallow prior to speech. SLP faded support to intermittent cues/demonstration. At end of session, targeted simple, phrase/sentence level conversational responses. In response to SLP question "What is your favorite TV Spears," patient independently responded using proper breath support and pacing, "Judy Spears is my favorite TV Spears." Instructed husband/caregiver in cuing strategies and in cuing for swallow prior to speaking to improve intelligibility. Phrase level tasks assigned for home practice.     Tracheostomy Shiley 4 mm Uncuffed   Tracheostomy Properties Placement Date: 02/24/16 Placement Time: 0927 Placed By: ICU physician Brand: Shiley Size (mm): 4 mm Style: Uncuffed   Status Capped;Secured   Site Assessment Clean;Dry   Ties Assessment Clean;Dry;Secure   Cuff pressure (cm) 0 cm     Assessment / Recommendations / Plan   Plan Continue with current plan of  care     Dysphagia Recommendations   Diet recommendations Dysphagia 1 (puree);Thin liquid   Liquids provided via Teaspoon;Cup   Medication Administration Via alternative means   Supervision Full supervision/cueing for compensatory strategies;Trained caregiver to feed patient   Compensations Effortful swallow;Multiple dry swallows after each bite/sip;Check for anterior loss;Small sips/bites;Slow rate     General Recommendations   Oral Care Recommendations Oral care before and after PO;Staff/trained caregiver to provide oral care   Follow up Recommendations Outpatient SLP     Progression Toward Goals   Progression toward goals Progressing toward goals          SLP Education - 04/05/16 1134    Education provided Yes   Education Details secretion management, aspiration precautions, comfort food recommendations, speech tasks   Person(s) Educated Patient;Spouse;Caregiver(s)   Methods Explanation;Demonstration   Comprehension Verbalized understanding;Returned demonstration          SLP Short Term Goals - 04/05/16 1141      SLP SHORT TERM GOAL #1   Title Patient will utilize adequate breath support, slow rate and visual cues from communication partner/AAC to communicate wants, needs, thoughts and ideas.   Baseline 03/15/16, 04/05/16   Time 2   Period Weeks   Status On-going     SLP SHORT TERM GOAL #2   Title  Patient will utilize intelligibility strategies, breath support with mod A for phrase repetition in 75% of trials.   Baseline 2.12.18, 04/05/16   Time 2   Period Weeks   Status On-going     SLP SHORT TERM GOAL #3   Title Pt will swallow thin liquids, purees and soft solids in 9/10 trials with no overt signs of aspiration.   Baseline 2.12.18, 03-12-16, 03-15-16   Time 4   Period Weeks   Status Achieved     SLP SHORT TERM GOAL #4   Title Patient will tolerate 8oz pleasure feeds with no overt signs of aspiration.   Time 2   Period Weeks   Status On-going           SLP Long Term Goals - 04/05/16 1142      SLP LONG TERM GOAL #1   Title Patient will utilize multimodal communication/AAC to clarify wants, needs, thoughts or ideas during communication breakdowns with max A   Time 2   Period Weeks   Status On-going     SLP LONG TERM GOAL #2   Title Patient will utilize intelligibility strategies, breath support with min A for 5 word functional phrases with 80% accuracy.   Time 2   Period Weeks   Status On-going     SLP LONG TERM GOAL #3   Title Patient will demonstrate readiness for MBS to determine safety for pleasure feeds by timely initiation of swallow in 3/5 trials with ice chips/H20.   Baseline 2.12.18   Time 2   Period Weeks   Status Achieved     SLP LONG TERM GOAL #4   Title Patient will tolerate pleasure feeds of 8 oz thin liquids, purees or soft solids.   Time 2   Period Weeks   Status On-going          Plan - 04/05/16 1135    Clinical Impression Statement Patient with sustained alertness and attention to session tasks, remained alert for the entire session. Increase in saliva appears to decrease swallow coordination; patient, family and caregiver benefitted from instruction in strategies to manage secretions during pleasure feeds to increase swallowing safety. Given difficulties with bolus formation of solids today, recommended downgrading pleasure feeds to purees, liquids, which patient manages with appearance of adequate airway protection with use of strategies, particularly dry swallow between boluses. Demonstrating excellent improvement in speech intelligibility with strategies of overarticulation, breath pacing for individual words. Reduced need for cuing today. Recommend skilled ST in order to maximize functional communication, swallowing safety, reduce caregiver burden and improve quality of life.   Speech Therapy Frequency 2x / week   Duration Other (comment)   Treatment/Interventions Aspiration precaution training;Trials of  upgraded texture/liquids;Compensatory strategies;Functional tasks;Patient/family education;Cueing hierarchy;Diet toleration management by SLP;Cognitive reorganization;Compensatory techniques;SLP instruction and feedback;Internal/external aids;Multimodal communcation approach   Potential to Achieve Goals Good   Potential Considerations Ability to learn/carryover information;Co-morbidities;Severity of impairments   SLP Home Exercise Plan comfort feeds purees/liquids only with oral care prior to and after feeding, functional and phrase level tasks for breath pacing   Consulted and Agree with Plan of Care Family member/caregiver;Patient   Family Member Consulted caregiver Judy Spears, husband Judy Spears      Patient will benefit from skilled therapeutic intervention in order to improve the following deficits and impairments:   Dysphagia, oropharyngeal phase  Dysarthria and anarthria  Cognitive communication deficit    Problem List Patient Active Problem List   Diagnosis Date Noted  . Heterotopic ossification of bone 03/10/2016  .  Acute on chronic respiratory failure with hypoxia ([facility])   . Acute hypoxemic respiratory failure ([facility])   . Acute blood loss anemia   . Atelectasis 02/04/2016  . Debility 02/03/2016  . Pneumonia of both lower lobes due to Pseudomonas species ([facility])   . Pneumonia of both lower lobes due to methicillin susceptible Staphylococcus aureus (MSSA) ([facility])   . Acute respiratory failure with hypoxia ([facility])   . Acute encephalopathy   . Seizures ([facility])   . Sepsis ([facility])   . Hypoxia   . Pain   . HCAP (healthcare-associated pneumonia) 12/28/2015  . Chronic respiratory failure ([facility]) 12/28/2015  . Acute tracheobronchitis 12/24/2015  . Tracheostomy tube present ([facility])   . Tracheal stenosis   . Chronic respiratory failure with hypoxia ([facility])   . 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[facility] [facility]) 10/20/2015  . Tracheostomy status ([place]) 09/26/2015  . Allergic rhinitis 09/26/2015  . 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  Movement disorder 09/22/2015  . Encephalitis 09/22/2015  . Chest pain 02/07/2014  . Premature atrial contractions 01/13/2014  . Abnormality of gait 12/04/2013  . Hyperlipidemia 12/21/2011  . Cardiovascular risk factor 12/21/2011  . Bunion 01/31/2008  . Metatarsalgia of both feet 09/20/2007  . FLAT FOOT 09/20/2007  . HALLUX RIGIDUS, ACQUIRED 09/20/2007   Deneise Lever, [place] CF-SLP Speech-Language Pathologist   Aliene Altes 04/05/2016, 11:43 AM  [facility] [address] [place], [place], [address] Phone: [phone]   Fax:  [phone]   Name: Judy Spears MRN: [id number] Date of Birth: Jun 19, 1950

## 2016-04-05 NOTE — Therapy (Signed)
[facility] [address] [place], [place], [address] Phone: [phone]   Fax:  [phone]  Physical Therapy Treatment  Patient Details  Name: Judy Spears MRN: [id number] Date of Birth: 1950-08-18 Referring Provider: Alger Simons, MD/Andrew Letta Pate, MD  Encounter Date: 04/05/2016      PT End of Session - 04/05/16 1324    Visit Number 11   Number of Visits 20   Date for PT Re-Evaluation 04/30/16   Authorization Type BC[employer]   PT Start Time 4010   PT Stop Time 1220   PT Time Calculation (min) 56 min      Past Medical History:  Diagnosis Date  . Arthritis    lt great toe  . Complication of anesthesia    pt has had headaches post op-did advise to hydra well preop  . Hair loss    right-sided  . History of exercise stress test    a. ETT (12/15) with 12:00 exercise, no ST changes, occasional PACs.   . Hyperlipidemia   . Insomnia   . Migraine headache   . 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 Postmenopausal   . Premature atrial contractions    a. Holter (12/15) with 8% PACs, no atrial runs or atrial fibrillation.     Past Surgical History:  Procedure Laterality Date  . BREAST BIOPSY  01/01/2011   Procedure: BREAST BIOPSY WITH NEEDLE LOCALIZATION;  Surgeon: Rolm Bookbinder, MD;  Location: [facility];  Service: General;  Laterality: Left;  left breast wire localization biopsy  . cataracts right eye    . CESAREAN SECTION  1986  . HERNIA REPAIR  2000   RIH  . opirectinal membrane peel    . RHINOPLASTY  1983    There were no vitals filed for this visit.      Subjective Assessment - 04/05/16 1428    Subjective Pt requesting to go to bathroom prior to start of PT session  - pt assisted by husband and caregiver   Patient is accompained by: Family member   Pertinent History Precautions:  PEG.  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  Another round of botox in LLE on 02/06/16   Limitations House hold activities;Walking;Standing;Sitting   Patient Stated Goals Per husband, "For [name] to be as independent as possible."   Currently in Pain? No/denies                         [facility] Adult PT Treatment/Exercise - 04/05/16 1432      Transfers   Transfers Sit to Stand   Sit to Stand 4: Min assist   Sit to Stand Details Tactile cues for sequencing;Verbal cues for technique;Tactile cues for posture;Tactile cues for weight shifting   Stand to Sit 3: Mod assist;4: Min assist   Stand to Sit Details cues to lean forward to increase hip flexion for sitting    Stand Pivot Transfers 3: Mod assist;4: Min assist     Ambulation/Gait   Ambulation/Gait Yes   Ambulation/Gait Assistance 3: Mod assist   Ambulation Distance (Feet) 70 Feet  40', 82' with RW   Assistive device Rolling walker   Gait Pattern Decreased dorsiflexion - right;Decreased hip/knee flexion - right;Right flexed knee in stance;Left flexed knee in stance;Right foot flat;Left  foot flat;Scissoring;Lateral trunk lean to left;Decreased weight shift to right;Narrow base of support;Poor foot clearance - right;Decreased trunk rotation   Ambulation Surface Level;Indoor     Knee/Hip Exercises: Aerobic   Nustep Nustep Level 3 with UE's and LE's x 6"  mirror used for visual feedback to decrease Lt lean     NeuroRe-ed:  Pt performed standing balance exercise - at side of mat - reaching for cones on Rt and Lt sides and placing  On bedside table with mod assist to maintain standing balance and to fascilitate trunk rotation  Pt performed reaching activity in sitting to fascilitate left trunk elongation - cone on floor on right side - reaching with LUE, retrieving Cone and placing on left side - out and up to fascilitate upright  posture and trunk rotation with Lt elongation             PT Short Term Goals - 03/30/16 2105      PT SHORT TERM GOAL #1   Title Pt will consistently perform supine rolling in B directions with min A (with rails as needed) in order to indicate increased trunk dissociation, increased independence with bed mobility.  (Target date for all STGs: 03/29/16)   Baseline rolled to R with CGA to min assist on 03-30-16; min assist from supine to Lt sidelying   Status Achieved     PT SHORT TERM GOAL #2   Title Pt will consistently perform supine <> sit with min A to indicate increased independence getting out of bed.    Baseline min to mod assist needed on 03-30-16   Status On-going     PT SHORT TERM GOAL #3   Title Pt will demonstrate midline posture in static sitting with BLE support and RUE support at a S level to indicate increased postural awareness/control.     Baseline met 03-30-16   Status Achieved     PT SHORT TERM GOAL #4   Title Pt will consistently perform functional transfers from w/c <> mat table with mod A to decrease caregiver burden.     Baseline mod assist with stand pivot transfers - 03-30-16   Status Achieved     PT SHORT TERM GOAL #5   Title Pt will consistently perform sit <> stand from personal w/c with BUE support and mod A to decrease caregiver burden.     Baseline met 03-30-16   Status Achieved     PT SHORT TERM GOAL #6   Title Pt will perform static standing with BUE support x1 minute with supervision while in stedy, no overt LOB to decrease caregiver burden.    Baseline pt able to stand unsupported with mod to min assist - not using Stedy in PT  (03-30-16)   Status Achieved           PT Long Term Goals - 03/01/16 1641      PT LONG TERM GOAL #1   Title Pt will perform supine <> sit with supervision 75% of the time to indicate increased independence getting out of bed.   (Target for all LTGs: 04/26/16)   Time 8   Period Weeks   Status New     PT LONG TERM GOAL #2    Title Pt will demonstrate midline posture in static sitting with BLE support without UE support to indicate increased postural awareness/control.    Time 8   Period Weeks   Status New     PT LONG TERM GOAL #3   Title Pt  will consistently perform functional transfers at min A level 75% of the time to decrease caregiver burden.     Time 8   Period Weeks   Status New     PT LONG TERM GOAL #4   Title Pt will perform sit <> stand from personal w/c with BUE support and min A, 75% of time to decrease caregiver burden.     Time 8   Period Weeks   Status New     PT LONG TERM GOAL #5   Title Pt will demonstrate ability to ambulate up to 5' w/ LRAD with max A in order to better assist with getting to/from restroom to decrease burden of care.    Time 8   Period Weeks   Status New               Plan - 04/05/16 1439    Clinical Impression Statement Pt had difficulty reaching and grasping with RUE with 3rd, 4th, and 5th fingers on RUE remaining flexed.  Pt's trunk continues to be shortened on left side with pt needing cues for elongation.  Pt continues to need fasciliation for trunk elongation on left side to assist with LLE advancement and then needs cues for weight shift onto LLE prior to steppig with RLE.  Pt stood with RW with close SBA +2 for count of 4 prior to LOB.     Rehab Potential Fair   Clinical Impairments Affecting Rehab Potential Chronicity of condition, continued medical management, but has very strong family and financial support   PT Frequency 2x / week   PT Duration 8 weeks   PT Treatment/Interventions ADLs/Self Care Home Management;DME Instruction;Gait training;Stair training;Functional mobility training;Therapeutic activities;Patient/family education;Cognitive remediation;Neuromuscular re-education;Therapeutic exercise;Balance training;Orthotic Fit/Training;Wheelchair mobility training;Manual techniques;Splinting;Visual/perceptual  remediation/compensation;Vestibular;Passive range of motion;Electrical Stimulation   PT Next Visit Plan cont gait and standing balance   Consulted and Agree with Plan of Care Patient;Family member/caregiver   Family Member Consulted caregiver, [name] and husband [name]      Patient will benefit from skilled therapeutic intervention in order to improve the following deficits and impairments:  Abnormal gait, Decreased balance, Decreased endurance, Decreased cognition, Cardiopulmonary status limiting activity, Decreased activity tolerance, Decreased coordination, Decreased strength, Impaired flexibility, Impaired tone, Decreased mobility, Increased muscle spasms, Impaired vision/preception, Decreased range of motion, Impaired UE functional use, Postural dysfunction, Improper body mechanics, Impaired perceived functional ability, Decreased knowledge of precautions, Decreased knowledge of use of DME  Visit Diagnosis: Other abnormalities of gait and mobility     Problem List Patient Active Problem List   Diagnosis Date Noted  . Heterotopic ossification of bone 03/10/2016  . Acute on chronic respiratory failure with hypoxia ([place])   . Acute hypoxemic respiratory failure ([place])   . Acute blood loss anemia   . Atelectasis 02/04/2016  . Debility 02/03/2016  . Pneumonia of both lower lobes due to Pseudomonas species ([place])   . Pneumonia of both lower lobes due to methicillin susceptible Staphylococcus aureus (MSSA) ([place])   . Acute respiratory failure with hypoxia ([place])   . Acute encephalopathy   . Seizures ([place])   . Sepsis ([place])   . Hypoxia   . Pain   . HCAP (healthcare-associated pneumonia) 12/28/2015  . Chronic respiratory failure ([place]) 12/28/2015  . Acute tracheobronchitis 12/24/2015  . Tracheostomy tube present ([place])   . Tracheal stenosis   . Chronic respiratory failure with hypoxia ([place])   . 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[place]) 10/20/2015  . Tracheostomy status ([place]) 09/26/2015  . Allergic rhinitis  09/26/2015  . 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 Movement disorder 09/22/2015  . Encephalitis 09/22/2015  . Chest pain 02/07/2014  . Premature atrial contractions 01/13/2014  . Abnormality of gait 12/04/2013  . Hyperlipidemia 12/21/2011  . Cardiovascular risk factor 12/21/2011  . Bunion 01/31/2008  . Metatarsalgia of both feet 09/20/2007  . FLAT FOOT 09/20/2007  . HALLUX RIGIDUS, ACQUIRED 09/20/2007    Alda Lea, PT 04/05/2016, 2:50 PM  [facility] [address] [place], [place], [address] Phone: [phone]   Fax:  [phone]  Name: Judy Spears MRN: [id number] Date of Birth: [birth year]

## 2016-04-08 ENCOUNTER — Ambulatory Visit: Payer: BC Managed Care – PPO | Admitting: Physical Therapy

## 2016-04-08 ENCOUNTER — Ambulatory Visit: Payer: BC Managed Care – PPO | Admitting: Occupational Therapy

## 2016-04-08 ENCOUNTER — Ambulatory Visit: Payer: BC Managed Care – PPO | Admitting: Speech Pathology

## 2016-04-08 ENCOUNTER — Encounter: Payer: Self-pay | Admitting: Occupational Therapy

## 2016-04-08 DIAGNOSIS — R1312 Dysphagia, oropharyngeal phase: Secondary | ICD-10-CM

## 2016-04-08 DIAGNOSIS — M25612 Stiffness of left shoulder, not elsewhere classified: Secondary | ICD-10-CM

## 2016-04-08 DIAGNOSIS — R293 Abnormal posture: Secondary | ICD-10-CM

## 2016-04-08 DIAGNOSIS — M6281 Muscle weakness (generalized): Secondary | ICD-10-CM

## 2016-04-08 DIAGNOSIS — M25622 Stiffness of left elbow, not elsewhere classified: Secondary | ICD-10-CM

## 2016-04-08 DIAGNOSIS — R41844 Frontal lobe and executive function deficit: Secondary | ICD-10-CM

## 2016-04-08 DIAGNOSIS — R2689 Other abnormalities of gait and mobility: Secondary | ICD-10-CM

## 2016-04-08 DIAGNOSIS — R471 Dysarthria and anarthria: Secondary | ICD-10-CM

## 2016-04-08 DIAGNOSIS — R2681 Unsteadiness on feet: Secondary | ICD-10-CM | POA: Diagnosis not present

## 2016-04-08 DIAGNOSIS — R29818 Other symptoms and signs involving the nervous system: Secondary | ICD-10-CM

## 2016-04-08 DIAGNOSIS — M25611 Stiffness of right shoulder, not elsewhere classified: Secondary | ICD-10-CM

## 2016-04-08 DIAGNOSIS — R41841 Cognitive communication deficit: Secondary | ICD-10-CM

## 2016-04-08 NOTE — Therapy (Signed)
[facility] [address] [place], [place], [address] Phone: [phone]   Fax:  [phone]  Speech Language Pathology Treatment  Patient Details  Name: Judy Spears MRN: [id number] Date of Birth: [birth year] Referring Provider: Charlett Blake, MD  Encounter Date: 04/08/2016      End of Session - 04/08/16 1746    Visit Number 12   Number of Visits 17   Date for SLP Re-Evaluation 04/30/16   SLP Start Time 9563   SLP Stop Time  1446   SLP Time Calculation (min) 44 min   Activity Tolerance Patient tolerated treatment well      Past Medical History:  Diagnosis Date  . Arthritis    lt great toe  . Complication of anesthesia    pt has had headaches post op-did advise to hydra well preop  . Hair loss    right-sided  . History of exercise stress test    a. ETT (12/15) with 12:00 exercise, no ST changes, occasional PACs.   . Hyperlipidemia   . Insomnia   . Migraine headache   . 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 Postmenopausal   . Premature atrial contractions    a. Holter (12/15) with 8% PACs, no atrial runs or atrial fibrillation.     Past Surgical History:  Procedure Laterality Date  . BREAST BIOPSY  01/01/2011   Procedure: BREAST BIOPSY WITH NEEDLE LOCALIZATION;  Surgeon: Rolm Bookbinder, MD;  Location: [facility];  Service: General;  Laterality: Left;  left breast wire localization biopsy  . cataracts right eye    . CESAREAN SECTION  1986  . HERNIA REPAIR  2000   RIH  . opirectinal membrane peel    . RHINOPLASTY  1983    There were no vitals filed for this visit.      Subjective Assessment - 04/08/16 1727    Subjective inaudible greeting to SLP, wet vocal quality   Patient is accompained by: Family member   Special Tests spouse, [name]; caregiver [name]   Currently in Pain? No/denies               ADULT SLP TREATMENT - 04/08/16 1727      General Information    Behavior/Cognition Alert;Cooperative;Pleasant mood   Patient Positioning Upright in chair   Oral care provided Yes  Swabbed mouth, tongue to remove mild stringy white secretion     Treatment Provided   Treatment provided Cognitive-Linquistic;Dysphagia     Dysphagia Treatment   Temperature Spikes Noted No   Respiratory Status Room air   Oral Cavity - Dentition Adequate natural dentition   Treatment Methods Skilled observation;Upgraded PO texture trial;Therapeutic exercise;Compensation strategy training;Patient/caregiver education   Patient observed directly with PO's Yes   Type of PO's observed Dysphagia 2 (chopped);Thin liquids   Feeding Total assist;Needs assist   Liquids provided via Cup   Oral Phase Signs & Symptoms Anterior loss/spillage;Prolonged bolus formation   Pharyngeal Phase Signs & Symptoms Other (comment)  wet vocal quality at baseline. clears with max cues   Type of cueing Verbal;Tactile;Visual   Amount of cueing Maximal  Mod-max   Other treatment/comments Patient with wet vocal quality upon arrival. Vocalizes vs. coughs initially despite max cues. Ultimately swallows with verbal cue and vocal quality improves. Oral  care performed to maximize safety for PO trials. Cup sips of thin liquid, swallow appears timely, however patient requires consistent verbal cues to close mouth and complete subsequent  dry swallow to manage secretions. Anterior loss of secretions x4; patient responsive to verbal cues but requires increased time to complete dry swallows today (~5 seconds). Trialed mechanical soft solids; patient with oral deficits contributing to reduced bolus cohesion, lingual and palatal residue. Instructed patient to complete lingual sweep and subsequent swallows, which reduces lingual residue but does not clear palate. SLP used oral swab to remove residue from palate. Recommend continuing comfort feeds of 4-6 oz of pureed and liquid textures only, emphasized importance of oral  care prior to and following PO intake.      Pain Assessment   Pain Assessment No/denies pain     Cognitive-Linquistic Treatment   Treatment focused on Dysarthria;Voice   Skilled Treatment Targeted speech intelligibility, breath support and pacing, and overarticulation with functional phrases of 2-7 words. Articulation and voicing improve with verbal cue to swallow prior to speech. Improves vocal amplitude with postural adjustment with feet flat on floor.     Tracheostomy Shiley 4 mm Uncuffed   Tracheostomy Properties Placement Date: 02/24/16 Placement Time: 0927 Placed By: ICU physician Brand: Shiley Size (mm): 4 mm Style: Uncuffed   Status Capped;Secured   Site Assessment Clean;Dry   Ties Assessment Clean;Dry;Secure   Cuff pressure (cm) 0 cm     Assessment / Recommendations / Plan   Plan Continue with current plan of care     Dysphagia Recommendations   Diet recommendations Dysphagia 1 (puree);Thin liquid   Liquids provided via Teaspoon;Cup   Medication Administration Via alternative means   Supervision Full supervision/cueing for compensatory strategies;Trained caregiver to feed patient   Compensations Effortful swallow;Multiple dry swallows after each bite/sip;Check for anterior loss;Small sips/bites;Slow rate   Postural Changes and/or Swallow Maneuvers Seated upright 90 degrees     General Recommendations   Oral Care Recommendations Oral care before and after PO;Staff/trained caregiver to provide oral care   Follow up Recommendations Outpatient SLP     Progression Toward Goals   Progression toward goals Progressing toward goals          SLP Education - 04/08/16 1745    Education provided Yes   Education Details secretion management, aspiration precautions, postural/breath support for speech   Person(s) Educated Patient;Spouse;Caregiver(s)   Methods Explanation;Demonstration;Verbal cues   Comprehension Verbalized understanding;Returned demonstration          SLP  Short Term Goals - 04/08/16 1752      SLP SHORT TERM GOAL #1   Title Patient will utilize adequate breath support, slow rate and visual cues from communication partner/AAC to communicate wants, needs, thoughts and ideas.   Baseline 03/15/16, 04/05/16   Time 2   Period Weeks   Status On-going     SLP SHORT TERM GOAL #2   Title Patient will utilize intelligibility strategies, breath support with mod A for phrase repetition in 75% of trials.   Baseline 2.12.18, 04/05/16, 04/08/16   Time 2   Period Weeks   Status On-going     SLP SHORT TERM GOAL #3   Title Pt will swallow thin liquids, purees and soft solids in 9/10 trials with no overt signs of aspiration.   Baseline 2.12.18, 03-12-16, 03-15-16   Time 4   Period Weeks   Status Achieved     SLP SHORT TERM GOAL #4   Title Patient will tolerate 6 oz pleasure feeds of pureed solids and thin liquids with no overt signs of aspiration.   Time 2   Period Weeks   Status Revised  SLP Long Term Goals - 04/08/16 1754      SLP LONG TERM GOAL #1   Title Patient will utilize multimodal communication/AAC to clarify wants, needs, thoughts or ideas during communication breakdowns with max A   Time 2   Period Weeks   Status Deferred     SLP LONG TERM GOAL #2   Title Patient will utilize intelligibility strategies, breath support with min A for 5 word functional phrases with 80% accuracy.   Time 2   Period Weeks   Status On-going     SLP LONG TERM GOAL #3   Title Patient will demonstrate readiness for MBS to determine safety for pleasure feeds by timely initiation of swallow in 3/5 trials with ice chips/H20.   Baseline 2.12.18   Time 2   Period Weeks   Status Achieved     SLP LONG TERM GOAL #4   Title Patient will tolerate pleasure feeds of 8 oz thin liquids, purees or soft solids.   Time 2   Period Weeks   Status On-going          Plan - 04/08/16 1752    Clinical Impression Statement Patient with sustained alertness and  attention to session tasks, remained alert for the entire session, though she did appear to have less energy today. Continues to present with need for additional cuing for management of saliva. Recommended continuing pleasure feeds of purees, liquids only given reduced bolus manipulation and clearance of soft solid residue. Patient is notably requiring less cuing for rate of speech, though today she requires moderate cues for breath/speech coordination. Recommend skilled ST in order to maximize functional communication, swallowing safety, reduce caregiver burden and improve quality of life.    Speech Therapy Frequency 2x / week   Duration Other (comment)   Treatment/Interventions Aspiration precaution training;Trials of upgraded texture/liquids;Compensatory strategies;Functional tasks;Patient/family education;Cueing hierarchy;Diet toleration management by SLP;Cognitive reorganization;Compensatory techniques;SLP instruction and feedback;Internal/external aids;Multimodal communcation approach   Potential to Achieve Goals Good   Potential Considerations Ability to learn/carryover information;Co-morbidities;Severity of impairments   SLP Home Exercise Plan comfort feeds purees/liquids only with oral care prior to and after feeding, functional and phrase level tasks for breath pacing   Consulted and Agree with Plan of Care Family member/caregiver;Patient   Family Member Consulted caregiver [name], husband [name]      Patient will benefit from skilled therapeutic intervention in order to improve the following deficits and impairments:   Dysphagia, oropharyngeal phase  Dysarthria and anarthria  Cognitive communication deficit    Problem List Patient Active Problem List   Diagnosis Date Noted  . Heterotopic ossification of bone 03/10/2016  . Acute on chronic respiratory failure with hypoxia ([place])   . Acute hypoxemic respiratory failure ([place])   . Acute blood loss anemia   . Atelectasis 02/04/2016  .  Debility 02/03/2016  . Pneumonia of both lower lobes due to Pseudomonas species ([place])   . Pneumonia of both lower lobes due to methicillin susceptible Staphylococcus aureus (MSSA) ([place])   . Acute respiratory failure with hypoxia ([place])   . Acute encephalopathy   . Seizures ([place])   . Sepsis ([place])   . Hypoxia   . Pain   . HCAP (healthcare-associated pneumonia) 12/28/2015  . Chronic respiratory failure ([place]) 12/28/2015  . Acute tracheobronchitis 12/24/2015  . Tracheostomy tube present ([place])   . Tracheal stenosis   . Chronic respiratory failure with hypoxia ([place])   . 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[place]) 10/20/2015  . Tracheostomy status ([place]) 09/26/2015  . Allergic  rhinitis 09/26/2015  . 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 Movement disorder 09/22/2015  . Encephalitis 09/22/2015  . Chest pain 02/07/2014  . Premature atrial contractions 01/13/2014  . Abnormality of gait 12/04/2013  . Hyperlipidemia 12/21/2011  . Cardiovascular risk factor 12/21/2011  . Bunion 01/31/2008  . Metatarsalgia of both feet 09/20/2007  . FLAT FOOT 09/20/2007  . HALLUX RIGIDUS, ACQUIRED 09/20/2007   Deneise Lever, [place] CF-SLP Speech-Language Pathologist  Aliene Altes 04/08/2016, 5:56 PM  [facility] [address] [place], [place], [address] Phone: [phone]   Fax:  [phone]   Name: MARELI ANTUNES MRN: [id number] Date of Birth: 1950/04/11

## 2016-04-08 NOTE — Therapy (Signed)
[facility] [address] Judy Spe[place], [place], [address] Phone: [phone]   Fax:  [phone]  Occupational Therapy Treatment  Patient Details  Name: Judy Spears MRN: [id number] Date of Birth: 04-Apr-1950 Referring Provider: Dr. Quita Skye. Judy Spears  Encounter Date: 04/08/2016      OT End of Session - 04/08/16 1543    Visit Number 11   Number of Visits 16   Date for OT Re-Evaluation 04/26/16   Authorization Type BC[employer] unlimited visits   OT Start Time 1448   OT Stop Time 1530   OT Time Calculation (min) 42 min   Activity Tolerance Patient tolerated treatment well      Past Medical History:  Diagnosis Date  . Arthritis    lt great toe  . Complication of anesthesia    pt has had headaches post op-did advise to hydra well preop  . Hair loss    right-sided  . History of exercise stress test    a. ETT (12/15) with 12:00 exercise, no ST changes, occasional PACs.   . Hyperlipidemia   . Insomnia   . Migraine headache   . 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 Postmenopausal   . Premature atrial contractions    a. Holter (12/15) with 8% PACs, no atrial runs or atrial fibrillation.     Past Surgical History:  Procedure Laterality Date  . BREAST BIOPSY  01/01/2011   Procedure: BREAST BIOPSY WITH NEEDLE LOCALIZATION;  Surgeon: Rolm Bookbinder, MD;  Location: [facility];  Service: General;  Laterality: Left;  left breast wire localization biopsy  . cataracts right eye    . CESAREAN SECTION  1986  . HERNIA REPAIR  2000   RIH  . opirectinal membrane peel    . RHINOPLASTY  1983    There were no vitals filed for this visit.      Subjective Assessment - 04/08/16 1458    Subjective  Yes when asked if she worked on eating in [place]   Patient is accompained by: Family member   Pertinent History see epic snapshot - ABI/encephalitis/hypoxia from virus; recent hospitalization due to ARF;    Patient  Stated Goals Pt unable to state.    Currently in Pain? No/denies                      OT Treatments/Exercises (OP) - 04/08/16 0001      Neurological Re-education Exercises   Other Exercises 1 Neuro re ed in supine to allow pt's trunk to relax and to decrease shoulder hike/abnormal posturing through head which affects reach with RUE.  Addressed mid reach at 26* with moderate facilitation and emphasis on depressing shoulder and reaching with hand for target and object.  Pt needs facilitation due to apraxia, abnormal posturing and weakness.  Also addressed holdingi in place at 55* - pt needs mod facilitation for this activity as well.  Rolled onto R side with min a  and facilitation to use patterns of flexion vs extension to roll, In sidelying slow rocking to decreased stiff abnormal posturing. Once relaxed transitioned from sidelying to sitting with max assist.  As pt begins to move more in transitional movements that incorporate more trunk rotation and head movement pt beginning to experience dizziness with movement.  Pt unable to rate intensity however duration appears to be approximatley 20 seconds and pt benefits from surface orientation to decreased symptoms.  Encouraged caregiver and pt to work  on holding RUE at 90* while supine as well as using sidelying to encourage patterns of flexion for pt.  Caregiver verbalized understanding.      Manual Therapy   Manual Therapy Joint mobilization;Soft tissue mobilization;Scapular mobilization   Manual therapy comments joint, soft tissue and scapular mobilization to address bilateral shoulder girdle tightness (R tighter than L), improve alignment prior to neuro re ed in supine for reaching                   OT Short Term Goals - 04/08/16 1541      OT SHORT TERM GOAL #1   Title Pt, husband and cargiver will be mod I with HEP - 03/29/2016   Status Achieved     OT SHORT TERM GOAL #2   Title Pt will be able to sit with supervision  without UE support and vc's to assist with midline 75% while engage in functional task   Status Achieved     OT SHORT TERM GOAL #3   Title Pt will be able to complete sit to stand consistently with mod A for toilet transfers (max a for pivot)   Status Achieved     OT SHORT TERM GOAL #4   Title Pt, husband and caregiver will verbalize understanding of various bathroom accomodations to reduce long term caregiver burden   Status Achieved           OT Long Term Goals - 04/08/16 1541      OT LONG TERM GOAL #1   Title Pt, husband and caregiver will be mod I with upgraded HEP prn - 04/26/2016   Status On-going     OT LONG TERM GOAL #2   Title Pt, husband and caregiver will be mod I with splint wear and care    Status Achieved     OT LONG TERM GOAL #3   Title Pt will be able to wash face with min a only   Status On-going     OT LONG TERM GOAL #4   Title Pt will be able to sit without UE support with supevision 100% of the time with no more than 2 vc's while engaged in functional activity.   Status Achieved     OT LONG TERM GOAL #5   Title Pt, husband and caregiver will verbalize understanding of potential after care therapeutic opportunities.    Status On-going               Plan - 04/08/16 1541    Clinical Impression Statement Pt continues to make slow but steady progress in terms of functional mobility, functional use of BUE's, posture and sustained attention   Rehab Potential Fair   Clinical Impairments Affecting Rehab Potential severity of deficits, very slow rate of recovery   OT Frequency 2x / week   OT Duration 8 weeks   OT Treatment/Interventions Self-care/ADL training;Ultrasound;Moist Heat;Electrical Stimulation;DME and/or AE instruction;Neuromuscular education;Therapeutic exercise;Functional Mobility Training;Manual Therapy;Passive range of motion;Splinting;Therapeutic activities;Balance training;Patient/family education;Visual/perceptual  remediation/compensation;Cognitive remediation/compensation   Plan functional use of UE"s, NMR for UE's, trunk, LE's, static to dynamic standing balance, functional mobility   Consulted and Agree with Plan of Care Patient;Family member/caregiver   Family Member Consulted caregiver Judy Spears, husband Judy Spears       Patient will benefit from skilled therapeutic intervention in order to improve the following deficits and impairments:  Decreased endurance, Decreased skin integrity, Impaired vision/preception, Improper body mechanics, Impaired perceived functional ability, Decreased knowledge of precautions, Cardiopulmonary status limiting activity, Decreased activity  tolerance, Decreased knowledge of use of DME, Decreased strength, Impaired flexibility, Improper spinal/pelvic alignment, Impaired sensation, Difficulty walking, Decreased mobility, Decreased balance, Decreased cognition, Decreased range of motion, Impaired tone, Impaired UE functional use, Decreased safety awareness, Decreased coordination, Pain  Visit Diagnosis: Muscle weakness (generalized)  Unsteadiness on feet  Abnormal posture  Other symptoms and signs involving the nervous system  Stiffness of left shoulder, not elsewhere classified  Stiffness of right shoulder, not elsewhere classified  Stiffness of left elbow, not elsewhere classified  Frontal lobe and executive function deficit    Problem List Patient Active Problem List   Diagnosis Date Noted  . Heterotopic ossification of bone 03/10/2016  . Acute on chronic respiratory failure with hypoxia ([place])   . Acute hypoxemic respiratory failure ([place])   . Acute blood loss anemia   . Atelectasis 02/04/2016  . Debility 02/03/2016  . Pneumonia of both lower lobes due to Pseudomonas species ([place])   . Pneumonia of both lower lobes due to methicillin susceptible Staphylococcus aureus (MSSA) ([place])   . Acute respiratory failure with hypoxia ([place])   . Acute encephalopathy   .  Seizures ([place])   . Sepsis ([place])   . Hypoxia   . Pain   . HCAP (healthcare-associated pneumonia) 12/28/2015  . Chronic respiratory failure ([place]) 12/28/2015  . Acute tracheobronchitis 12/24/2015  . Tracheostomy tube present ([place])   . Tracheal stenosis   . Chronic respiratory failure with hypoxia ([place])   . 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 Tracheostomy status ([place]) 09/26/2015  . Allergic rhinitis 09/26/2015  . 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 Movement disorder 09/22/2015  . Encephalitis 09/22/2015  . Chest pain 02/07/2014  . Premature atrial contractions 01/13/2014  . Abnormality of gait 12/04/2013  . Hyperlipidemia 12/21/2011  . Cardiovascular risk factor 12/21/2011  . Bunion 01/31/2008  . Metatarsalgia of both feet 09/20/2007  . FLAT FOOT 09/20/2007  . HALLUX RIGIDUS, ACQUIRED 09/20/2007    Quay Burow, OTR/L 04/08/2016, 3:44 PM  [facility] [address] [place], [place], [address] Phone: [phone]   Fax:  [phone]  Name: LORAIN FETTES MRN: [id number] Date of Birth: 1950/12/09

## 2016-04-09 NOTE — Therapy (Signed)
[facility] [address] [place], [place], [address] Phone: [phone]   Fax:  [phone]  Physical Therapy Treatment  Patient Details  Name: Judy Spears MRN: [id number] Date of Birth: 1950/10/01 Referring Provider: Alger Simons, MD/Andrew Letta Pate, MD  Encounter Date: 04/08/2016      PT End of Session - 04/09/16 1208    Visit Number 12   Number of Visits 20   Date for PT Re-Evaluation 04/30/16   Authorization Type BCBS   PT Start Time 1314   PT Stop Time 1400   PT Time Calculation (min) 46 min      Past Medical History:  Diagnosis Date  . Arthritis    lt great toe  . Complication of anesthesia    pt has had headaches post op-did advise to hydra well preop  . Hair loss    right-sided  . History of exercise stress test    a. ETT (12/15) with 12:00 exercise, no ST changes, occasional PACs.   . Hyperlipidemia   . Insomnia   . Migraine headache   . 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 Postmenopausal   . Premature atrial contractions    a. Holter (12/15) with 8% PACs, no atrial runs or atrial fibrillation.     Past Surgical History:  Procedure Laterality Date  . BREAST BIOPSY  01/01/2011   Procedure: BREAST BIOPSY WITH NEEDLE LOCALIZATION;  Surgeon: Rolm Bookbinder, MD;  Location: [facility];  Service: General;  Laterality: Left;  left breast wire localization biopsy  . cataracts right eye    . CESAREAN SECTION  1986  . HERNIA REPAIR  2000   RIH  . opirectinal membrane peel    . RHINOPLASTY  1983    There were no vitals filed for this visit.      Subjective Assessment - 04/09/16 1200    Subjective Husband ([name]) reports pt is doing great at home   Patient is accompained by: Family member   Pertinent History Precautions:  PEG.  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  Another round of botox in LLE on 02/06/16   Limitations House hold activities;Walking;Standing;Sitting   Patient Stated Goals Per husband, "For [name] to be as independent as possible."   Currently in Pain? No/denies                         [facility] Adult PT Treatment/Exercise - 04/09/16 0001      Transfers   Transfers Sit to Stand   Sit to Stand 4: Min assist   Sit to Stand Details Tactile cues for sequencing;Verbal cues for technique;Tactile cues for posture;Tactile cues for weight shifting   Sit to Stand Details (indicate cue type and reason) verbal and tactile cues to lean anteriorly   Stand to Sit 3: Mod assist   Stand to Sit Details cues to reach back for chair with RUE and to lean forward for controlled descent    Stand Pivot Transfers 3: Mod assist   Number of Reps Other reps (comment)  3     Ambulation/Gait   Ambulation/Gait Yes   Ambulation/Gait Assistance 3: Mod assist   Ambulation Distance (Feet) 35 Feet  45', 40' on 2nd and 3rd reps of gait training; 15' final rep  Assistive device Rolling walker   Gait Pattern Decreased dorsiflexion - right;Decreased hip/knee flexion - right;Right flexed knee in stance;Left flexed knee in stance;Right foot flat;Left foot flat;Scissoring;Lateral trunk lean to left;Decreased weight shift to right;Narrow base of support;Poor foot clearance - right;Decreased trunk rotation   Ambulation Surface Level;Indoor     Static Standing Balance   Static Standing - Balance Support No upper extremity supported   Static Standing - Level of Assistance 3: Mod assist;4: Min assist  cues for Lt foot placement and to incr. weight shift to Lt     NeuroRe-ed:  Worked on sitting balance and performing transitional movements - pt transferred from sitting onto Rt forearm For side sitting - performed reaching forward and up for fascilitation on left trunk  elongation; pt able to return to upright with SBA From Rt side sitting Performed sitting to Lt side sitting with min assist to assume position - performed reaching with RUE as able for trunk stabilization   Pt performed sitting balance activity with bean bags on chair on Rt side - pt reached with RUE to increase Lt trunk elongation and for left  Trunk stretching - tossed in basket which was placed at her feet as pt unable to toss with RUE - min to mod assist needed for grasping, Holding to attempt tossing in basket Performed trunk rotation with tactile cues given under left shoulder (in axilla region) for elongation- cues to lean to Rt side needed at  Times - 5 reps trunk rotation to each side             PT Short Term Goals - 03/30/16 2105      PT SHORT TERM GOAL #1   Title Pt will consistently perform supine rolling in B directions with min A (with rails as needed) in order to indicate increased trunk dissociation, increased independence with bed mobility.  (Target date for all STGs: 03/29/16)   Baseline rolled to R with CGA to min assist on 03-30-16; min assist from supine to Lt sidelying   Status Achieved     PT SHORT TERM GOAL #2   Title Pt will consistently perform supine <> sit with min A to indicate increased independence getting out of bed.    Baseline min to mod assist needed on 03-30-16   Status On-going     PT SHORT TERM GOAL #3   Title Pt will demonstrate midline posture in static sitting with BLE support and RUE support at a S level to indicate increased postural awareness/control.     Baseline met 03-30-16   Status Achieved     PT SHORT TERM GOAL #4   Title Pt will consistently perform functional transfers from w/c <> mat table with mod A to decrease caregiver burden.     Baseline mod assist with stand pivot transfers - 03-30-16   Status Achieved     PT SHORT TERM GOAL #5   Title Pt will consistently perform sit <> stand from personal w/c with BUE support and mod A to  decrease caregiver burden.     Baseline met 03-30-16   Status Achieved     PT SHORT TERM GOAL #6   Title Pt will perform static standing with BUE support x1 minute with supervision while in stedy, no overt LOB to decrease caregiver burden.    Baseline pt able to stand unsupported with mod to min assist - not using Stedy in PT  (03-30-16)   Status Achieved  PT Long Term Goals - 03/01/16 1641      PT LONG TERM GOAL #1   Title Pt will perform supine <> sit with supervision 75% of the time to indicate increased independence getting out of bed.   (Target for all LTGs: 04/26/16)   Time 8   Period Weeks   Status New     PT LONG TERM GOAL #2   Title Pt will demonstrate midline posture in static sitting with BLE support without UE support to indicate increased postural awareness/control.    Time 8   Period Weeks   Status New     PT LONG TERM GOAL #3   Title Pt will consistently perform functional transfers at min A level 75% of the time to decrease caregiver burden.     Time 8   Period Weeks   Status New     PT LONG TERM GOAL #4   Title Pt will perform sit <> stand from personal w/c with BUE support and min A, 75% of time to decrease caregiver burden.     Time 8   Period Weeks   Status New     PT LONG TERM GOAL #5   Title Pt will demonstrate ability to ambulate up to 5' w/ LRAD with max A in order to better assist with getting to/from restroom to decrease burden of care.    Time 8   Period Weeks   Status New               Plan - 04/09/16 1314    Clinical Impression Statement Pt demonstrating increased trunk control and sitting balance as evidenced by ability to sit unsupported on side of mat without LOB, and pt demonstrated dynamic sitting balance improvement by initiating/performing a kick to a stool which was located in front of her - she independently kicked it out of way in preparation for standing/gait training without LOB with this movement.  Pt continues to  need max/mod assist for Lt trunk elongation with weight shifting onto LLE for RLE swing through.  Transitional movements are improving with pt able to transfer from Rt side sitting to upright seated position with SBA.  Rehab Potential Fair   Clinical Impairments Affecting Rehab Potential Chronicity of condition, continued medical management, but has very strong family and financial support   PT Frequency 2x / week   PT Duration 8 weeks   PT Treatment/Interventions ADLs/Self Care Home Management;DME Instruction;Gait training;Stair training;Functional mobility training;Therapeutic activities;Patient/family education;Cognitive remediation;Neuromuscular re-education;Therapeutic exercise;Balance training;Orthotic Fit/Training;Wheelchair mobility training;Manual techniques;Splinting;Visual/perceptual remediation/compensation;Vestibular;Passive range of motion;Electrical Stimulation   PT Next Visit Plan cont. transfer training, gait and balance training; activities to fascilitate Lt trunk elongation   Consulted and Agree with Plan of Care Patient;Family member/caregiver   Family Member Consulted caregiver, [name] and husband [name]      Patient will benefit from skilled therapeutic intervention in order to improve the following deficits and impairments:  Abnormal gait, Decreased balance, Decreased endurance, Decreased cognition, Cardiopulmonary status limiting activity, Decreased activity tolerance, Decreased coordination, Decreased strength, Impaired flexibility, Impaired tone, Decreased mobility, Increased muscle spasms, Impaired vision/preception, Decreased range of motion, Impaired UE functional use, Postural dysfunction, Improper body mechanics, Impaired perceived functional ability,  Decreased knowledge of precautions, Decreased knowledge of use of DME  Visit Diagnosis: Muscle weakness (generalized)  Other abnormalities of gait and mobility     Problem List Patient Active Problem List   Diagnosis Date Noted  . Heterotopic ossification of bone 03/10/2016  . Acute on chronic respiratory failure with hypoxia ([place])   . Acute hypoxemic respiratory failure ([facility])   . Acute blood loss anemia   . Atelectasis 02/04/2016  . Debility 02/03/2016  . Pneumonia of both lower lobes due to Pseudomonas species ([place])   . Pneumonia of both lower lobes due to methicillin susceptible Staphylococcus aureus (MSSA) ([place])   . Acute respiratory failure with hypoxia ([place])   . Acute encephalopathy   . Seizures ([place])   . Sepsis ([place])   . Hypoxia   . Pain   . HCAP (healthcare-associated pneumonia) 12/28/2015  . Chronic respiratory failure ([place]) 12/28/2015  . Acute tracheobronchitis 12/24/2015  . Tracheostomy tube present ([place])   . Tracheal stenosis   . Chronic respiratory failure with hypoxia ([place])   . 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[place]) 10/20/2015  . Tracheostomy status ([place]) 09/26/2015  . Allergic rhinitis 09/26/2015  . 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 Movement disorder 09/22/2015  . Encephalitis 09/22/2015  . Chest pain 02/07/2014  . Premature atrial contractions 01/13/2014  . Abnormality of gait 12/04/2013  . Hyperlipidemia 12/21/2011  . Cardiovascular risk factor 12/21/2011  . Bunion 01/31/2008  . Metatarsalgia of both feet 09/20/2007  . FLAT FOOT 09/20/2007  . HALLUX RIGIDUS, ACQUIRED 09/20/2007    Alda Lea, PT 04/09/2016, 1:25 PM  [facility] [address] [place], [place], [address] Phone: [phone]   Fax:  [phone]  Name: Judy Spears MRN: [id number] Date of Birth: 1950-03-30

## 2016-04-12 ENCOUNTER — Ambulatory Visit: Payer: BC Managed Care – PPO | Admitting: Occupational Therapy

## 2016-04-12 ENCOUNTER — Encounter: Payer: Self-pay | Admitting: Rehabilitation

## 2016-04-12 ENCOUNTER — Encounter: Payer: Self-pay | Admitting: Occupational Therapy

## 2016-04-12 ENCOUNTER — Ambulatory Visit: Payer: BC Managed Care – PPO | Admitting: Rehabilitation

## 2016-04-12 ENCOUNTER — Ambulatory Visit: Payer: BC Managed Care – PPO | Admitting: Speech Pathology

## 2016-04-12 DIAGNOSIS — R471 Dysarthria and anarthria: Secondary | ICD-10-CM

## 2016-04-12 DIAGNOSIS — R2681 Unsteadiness on feet: Secondary | ICD-10-CM

## 2016-04-12 DIAGNOSIS — M6281 Muscle weakness (generalized): Secondary | ICD-10-CM

## 2016-04-12 DIAGNOSIS — R41841 Cognitive communication deficit: Secondary | ICD-10-CM

## 2016-04-12 DIAGNOSIS — R293 Abnormal posture: Secondary | ICD-10-CM

## 2016-04-12 DIAGNOSIS — M25622 Stiffness of left elbow, not elsewhere classified: Secondary | ICD-10-CM

## 2016-04-12 DIAGNOSIS — M25611 Stiffness of right shoulder, not elsewhere classified: Secondary | ICD-10-CM

## 2016-04-12 DIAGNOSIS — R278 Other lack of coordination: Secondary | ICD-10-CM

## 2016-04-12 DIAGNOSIS — R41844 Frontal lobe and executive function deficit: Secondary | ICD-10-CM

## 2016-04-12 DIAGNOSIS — R2689 Other abnormalities of gait and mobility: Secondary | ICD-10-CM

## 2016-04-12 DIAGNOSIS — R29818 Other symptoms and signs involving the nervous system: Secondary | ICD-10-CM

## 2016-04-12 DIAGNOSIS — M25612 Stiffness of left shoulder, not elsewhere classified: Secondary | ICD-10-CM

## 2016-04-12 DIAGNOSIS — R1312 Dysphagia, oropharyngeal phase: Secondary | ICD-10-CM

## 2016-04-12 NOTE — Therapy (Signed)
[facility] [address] [place], [place], [address] Phone: [phone]   Fax:  [phone]  Occupational Therapy Treatment  Patient Details  Name: Judy Spears MRN: [id number] Date of Birth: Aug 15, 1950 Referring Provider: Dr. Quita Skye. [name]  Encounter Date: 04/12/2016      OT End of Session - 04/12/16 1620    Visit Number 12   Number of Visits 16   Date for OT Re-Evaluation 04/26/16   Authorization Type BCBS unlimited visits   OT Start Time 1402   OT Stop Time 1444   OT Time Calculation (min) 42 min   Activity Tolerance Patient tolerated treatment well      Past Medical History:  Diagnosis Date  . Arthritis    lt great toe  . Complication of anesthesia    pt has had headaches post op-did advise to hydra well preop  . Hair loss    right-sided  . History of exercise stress test    a. ETT (12/15) with 12:00 exercise, no ST changes, occasional PACs.   . Hyperlipidemia   . Insomnia   . Migraine headache   . 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 Postmenopausal   . Premature atrial contractions    a. Holter (12/15) with 8% PACs, no atrial runs or atrial fibrillation.     Past Surgical History:  Procedure Laterality Date  . BREAST BIOPSY  01/01/2011   Procedure: BREAST BIOPSY WITH NEEDLE LOCALIZATION;  Surgeon: Rolm Bookbinder, MD;  Location: [facility];  Service: General;  Laterality: Left;  left breast wire localization biopsy  . cataracts right eye    . CESAREAN SECTION  1986  . HERNIA REPAIR  2000   RIH  . opirectinal membrane peel    . RHINOPLASTY  1983    There were no vitals filed for this visit.      Subjective Assessment - 04/12/16 1411    Subjective  that's my grandson   Patient is accompained by: Family member  husband, caregiver [name]   Pertinent History see epic snapshot - ABI/encephalitis/hypoxia from virus; recent hospitalization due to ARF;    Patient  Stated Goals Pt unable to state.                       OT Treatments/Exercises (OP) - 04/12/16 1559      Neurological Re-education Exercises   Other Exercises 1 Addressed active transfers from wheelchair to standard chair - pt requiring max vc's and min a for stand pivot transfers .  Once seated in standard chair at table address RUE functional reach activities with pt with emphasis on decreasing shoulder hiking which leads to IR, full extension of RUE and pronation.  Used table for sliding for reach to decrease impact of gravity on tone as well as 2 point pinch and full grasp and release.  Pt needs max cues and appears to have signifcant visual deficits as well as motor planning deficits when attempting to use R dominant hand.  Progressed to bilateral closed chain reach by holding item with both hands and sliding on table. Pt needs mod facilitation.  Demonstrated reaching activities for home to caregiver - will review next time.                  OT Short Term Goals - 04/12/16 1617      OT SHORT TERM GOAL #1   Title Pt, husband and cargiver will  be mod I with HEP - 03/29/2016   Status Achieved     OT SHORT TERM GOAL #2   Title Pt will be able to sit with supervision without UE support and vc's to assist with midline 75% while engage in functional task   Status Achieved     OT SHORT TERM GOAL #3   Title Pt will be able to complete sit to stand consistently with mod A for toilet transfers (max a for pivot)   Status Achieved     OT SHORT TERM GOAL #4   Title Pt, husband and caregiver will verbalize understanding of various bathroom accomodations to reduce long term caregiver burden   Status Achieved           OT Long Term Goals - 04/12/16 1618      OT LONG TERM GOAL #1   Title Pt, husband and caregiver will be mod I with upgraded HEP prn - 04/26/2016   Status On-going     OT LONG TERM GOAL #2   Title Pt, husband and caregiver will be mod I with splint wear  and care    Status Achieved     OT LONG TERM GOAL #3   Title Pt will be able to wash face with min a only   Status On-going     OT LONG TERM GOAL #4   Title Pt will be able to sit without UE support with supevision 100% of the time with no more than 2 vc's while engaged in functional activity.   Status Achieved     OT LONG TERM GOAL #5   Title Pt, husband and caregiver will verbalize understanding of potential after care therapeutic opportunities.    Status On-going               Plan - 04/12/16 1618    Clinical Impression Statement Pt continues to make very slow but steady progress.  Pt with improved postural alignment in sitting today in standard chair (vs supported wheelchair).     Rehab Potential Fair   Clinical Impairments Affecting Rehab Potential severity of deficits, very slow rate of recovery   OT Frequency 2x / week   OT Duration 8 weeks   OT Treatment/Interventions Self-care/ADL training;Ultrasound;Moist Heat;Electrical Stimulation;DME and/or AE instruction;Neuromuscular education;Therapeutic exercise;Functional Mobility Training;Manual Therapy;Passive range of motion;Splinting;Therapeutic activities;Balance training;Patient/family education;Visual/perceptual remediation/compensation;Cognitive remediation/compensation   Plan functional use of UE's, NMR for UE's, trunk. LE's, static to dynamic standing balance, finding midline, functional mobility   OT Home Exercise Plan Reviewed plan for home management for left elbow range of motion - JAS 3x/day FOR 30 min./time, and locked elbow extesnion brace at night   Consulted and Agree with Plan of Care Patient;Family member/caregiver   Family Member Consulted caregiver [name], husband [name]       Patient will benefit from skilled therapeutic intervention in order to improve the following deficits and impairments:  Decreased endurance, Decreased skin integrity, Impaired vision/preception, Improper body mechanics, Impaired  perceived functional ability, Decreased knowledge of precautions, Cardiopulmonary status limiting activity, Decreased activity tolerance, Decreased knowledge of use of DME, Decreased strength, Impaired flexibility, Improper spinal/pelvic alignment, Impaired sensation, Difficulty walking, Decreased mobility, Decreased balance, Decreased cognition, Decreased range of motion, Impaired tone, Impaired UE functional use, Decreased safety awareness, Decreased coordination, Pain  Visit Diagnosis: Muscle weakness (generalized)  Unsteadiness on feet  Abnormal posture  Other symptoms and signs involving the nervous system  Stiffness of left shoulder, not elsewhere classified  Stiffness of right shoulder, not elsewhere  classified  Stiffness of left elbow, not elsewhere classified  Frontal lobe and executive function deficit  Other lack of coordination    Problem List Patient Active Problem List   Diagnosis Date Noted  . Heterotopic ossification of bone 03/10/2016  . Acute on chronic respiratory failure with hypoxia ([place])   . Acute hypoxemic respiratory failure ([place])   . Acute blood loss anemia   . Atelectasis 02/04/2016  . Debility 02/03/2016  . Pneumonia of both lower lobes due to Pseudomonas species ([place])   . Pneumonia of both lower lobes due to methicillin susceptible Staphylococcus aureus (MSSA) ([place])   . Acute respiratory failure with hypoxia ([place])   . Acute encephalopathy   . Seizures ([place])   . Sepsis ([place])   . Hypoxia   . Pain   . HCAP (healthcare-associated pneumonia) 12/28/2015  . Chronic respiratory failure ([place]) 12/28/2015  . Acute tracheobronchitis 12/24/2015  . Tracheostomy tube present ([place])   . Tracheal stenosis   . Chronic respiratory failure with hypoxia ([place])   . 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[place]) 10/20/2015  . Tracheostomy status ([place]) 09/26/2015  . Allergic rhinitis 09/26/2015  . 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 Movement disorder 09/22/2015  . Encephalitis 09/22/2015   . Chest pain 02/07/2014  . Premature atrial contractions 01/13/2014  . Abnormality of gait 12/04/2013  . Hyperlipidemia 12/21/2011  . Cardiovascular risk factor 12/21/2011  . Bunion 01/31/2008  . Metatarsalgia of both feet 09/20/2007  . FLAT FOOT 09/20/2007  . HALLUX RIGIDUS, ACQUIRED 09/20/2007    Quay Burow, OTR/L 04/12/2016, 4:24 PM  [facility] [address] [place], [place], [address] Phone: [phone]   Fax:  [phone]  Name: ALARIA OCONNOR MRN: [id number] Date of Birth: 1950-02-16

## 2016-04-12 NOTE — Therapy (Signed)
[facility] [address] [place], [place], [address] Phone: [phone]   Fax:  [phone]  Physical Therapy Treatment  Patient Details  Name: NOVALI VOLLMAN MRN: [id number] Date of Birth: 1951-01-23 Referring Provider: Alger Simons, MD/Andrew Letta Pate, MD  Encounter Date: 04/12/2016      PT End of Session - 04/12/16 1433    Visit Number 13   Number of Visits 20   Date for PT Re-Evaluation 04/30/16   Authorization Type BCBS   PT Start Time 1316   PT Stop Time 1400   PT Time Calculation (min) 44 min   Activity Tolerance Patient tolerated treatment well   Behavior During Therapy [facility] for tasks assessed/performed      Past Medical History:  Diagnosis Date  . Arthritis    lt great toe  . Complication of anesthesia    pt has had headaches post op-did advise to hydra well preop  . Hair loss    right-sided  . History of exercise stress test    a. ETT (12/15) with 12:00 exercise, no ST changes, occasional PACs.   . Hyperlipidemia   . Insomnia   . Migraine headache   . 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 Postmenopausal   . Premature atrial contractions    a. Holter (12/15) with 8% PACs, no atrial runs or atrial fibrillation.     Past Surgical History:  Procedure Laterality Date  . BREAST BIOPSY  01/01/2011   Procedure: BREAST BIOPSY WITH NEEDLE LOCALIZATION;  Surgeon: Rolm Bookbinder, MD;  Location: [facility];  Service: General;  Laterality: Left;  left breast wire localization biopsy  . cataracts right eye    . CESAREAN SECTION  1986  . HERNIA REPAIR  2000   RIH  . opirectinal membrane peel    . RHINOPLASTY  1983    There were no vitals filed for this visit.      Subjective Assessment - 04/12/16 1420    Subjective Caregiver [name] reports things are going well at home, no changes to report.     Patient is accompained by: Family member   Pertinent History Precautions:  PEG.   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  Another round of botox in LLE on 02/06/16   Patient Stated Goals Per husband, "For [place] to be as independent as possible."   Currently in Pain? No/denies                         [facility] Adult PT Treatment/Exercise - 04/12/16 0001      Transfers   Transfers Sit to Stand   Sit to Stand 4: Min assist;3: Mod assist   Sit to Stand Details Tactile cues for sequencing;Verbal cues for technique;Tactile cues for posture;Tactile cues for weight shifting;Manual facilitation for weight shifting;Manual facilitation for placement   Sit to Stand Details (indicate cue type and reason) Pt varies with assist needed, however initially requires up to mod A, second stand min A and third required mod A again.  All three trials with cues for forward weight shift (sliding hands forward on lap, bringing head/chest forward).     Stand to Sit 4: Min assist   Stand to Sit Details (indicate cue type and reason) Manual facilitation for weight shifting;Tactile cues  for weight shifting;Verbal cues for technique;Tactile cues for sequencing;Tactile cues for initiation;Verbal cues for sequencing;Manual facilitation for weight bearing   Stand to Sit Details Pt doing much better with forward trunk lean to control descent to chair.      Ambulation/Gait   Ambulation/Gait Yes   Ambulation/Gait Assistance 3: Mod assist;2: Max assist;1: +2 Total assist  +2 on either side (PT on L doing mod/min, PT on R min)   Ambulation/Gait Assistance Details Pt ambulated x 90' with RW at mod/max A (+2).  Note improvement with ability to maintain upright posture with frequent cues, improved L LE WB/activation.  Continue to note decreased push through RW and with increased fatigue, demonstrates narrow BOS and  increased forward trunk lean.     Ambulation Distance (Feet) 90 Feet   Assistive device Rolling walker   Gait Pattern Decreased dorsiflexion - right;Decreased hip/knee flexion - right;Right flexed knee in stance;Left flexed knee in stance;Right foot flat;Left foot flat;Scissoring;Lateral trunk lean to left;Decreased weight shift to right;Narrow base of support;Poor foot clearance - right;Decreased trunk rotation   Ambulation Surface Level;Indoor     Neuro Re-ed    Neuro Re-ed Details  NMR in standing facing mat table for improved postural control, forward/backward, lateral weight shifts and bimanual tasks for improved balance and improved postural control to carryover to gait.  Had pt grasp cones and stack to the L for forward and lateral weight shift.  Note that with increased forward weight shift, at times would need assist to get back onto heels, however pt very uncomfortable in this position feeling as if she is falling backwards.  Attempted to have pt maintain this position several tries however she is unable to maintain without at least min/guard tactile support.  Also had pt move large ball forward/back and side to side.  Note that posterior weight shift again is difficult to attain without assist.  Folding scarf task again for improved balance and also to address postural control with R shoulder depression and relaxed/flexed R elbow.                  PT Education - 04/12/16 1432    Education provided Yes   Education Details Briefly discussed pt standing at counter top to work on balance and weight shift.    Person(s) Educated Patient;Caregiver(s)   Methods Explanation   Comprehension Verbalized understanding          PT Short Term Goals - 03/30/16 2105      PT SHORT TERM GOAL #1   Title Pt will consistently perform supine rolling in B directions with min A (with rails as needed) in order to indicate increased trunk dissociation, increased independence with bed mobility.   (Target date for all STGs: 03/29/16)   Baseline rolled to R with CGA to min assist on 03-30-16; min assist from supine to Lt sidelying   Status Achieved     PT SHORT TERM GOAL #2   Title Pt will consistently perform supine <> sit with min A to indicate increased independence getting out of bed.    Baseline min to mod assist needed on 03-30-16   Status On-going     PT SHORT TERM GOAL #3   Title Pt will demonstrate midline posture in static sitting with BLE support and RUE support at a S level to indicate increased postural awareness/control.     Baseline met 03-30-16   Status Achieved     PT SHORT TERM GOAL #4   Title Pt will  consistently perform functional transfers from w/c <> mat table with mod A to decrease caregiver burden.     Baseline mod assist with stand pivot transfers - 03-30-16   Status Achieved     PT SHORT TERM GOAL #5   Title Pt will consistently perform sit <> stand from personal w/c with BUE support and mod A to decrease caregiver burden.     Baseline met 03-30-16   Status Achieved     PT SHORT TERM GOAL #6   Title Pt will perform static standing with BUE support x1 minute with supervision while in stedy, no overt LOB to decrease caregiver burden.    Baseline pt able to stand unsupported with mod to min assist - not using Stedy in PT  (03-30-16)   Status Achieved           PT Long Term Goals - 03/01/16 1641      PT LONG TERM GOAL #1   Title Pt will perform supine <> sit with supervision 75% of the time to indicate increased independence getting out of bed.   (Target for all LTGs: 04/26/16)   Time 8   Period Weeks   Status New     PT LONG TERM GOAL #2   Title Pt will demonstrate midline posture in static sitting with BLE support without UE support to indicate increased postural awareness/control.    Time 8   Period Weeks   Status New     PT LONG TERM GOAL #3   Title Pt will consistently perform functional transfers at min A level 75% of the time to decrease caregiver  burden.     Time 8   Period Weeks   Status New     PT LONG TERM GOAL #4   Title Pt will perform sit <> stand from personal w/c with BUE support and min A, 75% of time to decrease caregiver burden.     Time 8   Period Weeks   Status New     PT LONG TERM GOAL #5   Title Pt will demonstrate ability to ambulate up to 5' w/ LRAD with max A in order to better assist with getting to/from restroom to decrease burden of care.    Time 8   Period Weeks   Status New               Plan - 04/12/16 1433    Clinical Impression Statement Pt demonstrates improved trunk and postural control during gait following standing activity with improved ability to maintain upright posture, decreased L lateral flexion and improved activation of LLE during stance phase of gait.  Continue to note increased forward trunk lean if not constantly cued and with increased fatigue.    Rehab Potential Fair   Clinical Impairments Affecting Rehab Potential Chronicity of condition, continued medical management, but has very strong family and financial support   PT Frequency 2x / week   PT Duration 8 weeks   PT Treatment/Interventions ADLs/Self Care Home Management;DME Instruction;Gait training;Stair training;Functional mobility training;Therapeutic activities;Patient/family education;Cognitive remediation;Neuromuscular re-education;Therapeutic exercise;Balance training;Orthotic Fit/Training;Wheelchair mobility training;Manual techniques;Splinting;Visual/perceptual remediation/compensation;Vestibular;Passive range of motion;Electrical Stimulation   PT Next Visit Plan cont. transfer training, gait and balance training; activities to fascilitate Lt trunk elongation   Consulted and Agree with Plan of Care Patient;Family member/caregiver   Family Member Consulted caregiver, [name] and husband [name]      Patient will benefit from skilled therapeutic intervention in order to improve the following deficits and impairments:   Abnormal  gait, Decreased balance, Decreased endurance, Decreased cognition, Cardiopulmonary status limiting activity, Decreased activity tolerance, Decreased coordination, Decreased strength, Impaired flexibility, Impaired tone, Decreased mobility, Increased muscle spasms, Impaired vision/preception, Decreased range of motion, Impaired UE functional use, Postural dysfunction, Improper body mechanics, Impaired perceived functional ability, Decreased knowledge of precautions, Decreased knowledge of use of DME  Visit Diagnosis: Muscle weakness (generalized)  Unsteadiness on feet  Abnormal posture  Other abnormalities of gait and mobility     Problem List Patient Active Problem List   Diagnosis Date Noted  . Heterotopic ossification of bone 03/10/2016  . Acute on chronic respiratory failure with hypoxia ([place])   . Acute hypoxemic respiratory failure ([place])   . Acute blood loss anemia   . Atelectasis 02/04/2016  . Debility 02/03/2016  . Pneumonia of both lower lobes due to Pseudomonas species ([place])   . Pneumonia of both lower lobes due to methicillin susceptible Staphylococcus aureus (MSSA) ([place])   . Acute respiratory failure with hypoxia ([place])   . Acute encephalopathy   . Seizures ([place])   . Sepsis ([place])   . Hypoxia   . Pain   . HCAP (healthcare-associated pneumonia) 12/28/2015  . Chronic respiratory failure ([place]) 12/28/2015  . Acute tracheobronchitis 12/24/2015  . Tracheostomy tube present ([place])   . Tracheal stenosis   . Chronic respiratory failure with hypoxia ([place])   . 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[place]) 10/20/2015  . Tracheostomy status ([place]) 09/26/2015  . Allergic rhinitis 09/26/2015  . 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 Movement disorder 09/22/2015  . Encephalitis 09/22/2015  . Chest pain 02/07/2014  . Premature atrial contractions 01/13/2014  . Abnormality of gait 12/04/2013  . Hyperlipidemia 12/21/2011  . Cardiovascular risk factor 12/21/2011  . Bunion 01/31/2008  .  Metatarsalgia of both feet 09/20/2007  . FLAT FOOT 09/20/2007  . HALLUX RIGIDUS, ACQUIRED 09/20/2007    Cameron Sprang, PT, MPT [facility] [address] [place], [place], [address] Phone: [phone]   Fax:  [phone] 04/12/16, 2:37 PM  Name: FIORELA PELZER MRN: [id number] Date of Birth: 1950-04-25

## 2016-04-12 NOTE — Therapy (Signed)
[facility] [address] [place], [place], [address] Phone: [phone]   Fax:  [phone]  Speech Language Pathology Treatment  Patient Details  Name: Judy Spears MRN: [id number] Date of Birth: November 06, 1950 Referring Provider: Charlett Blake, MD  Encounter Date: 04/12/2016      End of Session - 04/12/16 1722    Visit Number 13   Number of Visits 17   Date for SLP Re-Evaluation 04/30/16   SLP Start Time 1450   SLP Stop Time  1530   SLP Time Calculation (min) 40 min   Activity Tolerance Patient tolerated treatment well      Past Medical History:  Diagnosis Date  . Arthritis    lt great toe  . Complication of anesthesia    pt has had headaches post op-did advise to hydra well preop  . Hair loss    right-sided  . History of exercise stress test    a. ETT (12/15) with 12:00 exercise, no ST changes, occasional PACs.   . Hyperlipidemia   . Insomnia   . Migraine headache   . 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 Postmenopausal   . Premature atrial contractions    a. Holter (12/15) with 8% PACs, no atrial runs or atrial fibrillation.     Past Surgical History:  Procedure Laterality Date  . BREAST BIOPSY  01/01/2011   Procedure: BREAST BIOPSY WITH NEEDLE LOCALIZATION;  Surgeon: Rolm Bookbinder, MD;  Location: [facility];  Service: General;  Laterality: Left;  left breast wire localization biopsy  . cataracts right eye    . CESAREAN SECTION  1986  . HERNIA REPAIR  2000   RIH  . opirectinal membrane peel    . RHINOPLASTY  1983    There were no vitals filed for this visit.      Subjective Assessment - 04/12/16 1704    Subjective "Can - we - go - to - speech - now?"   Patient is accompained by: Family member   Special Tests spouse, [name]; caregiver [name]   Currently in Pain? No/denies               ADULT SLP TREATMENT - 04/12/16 1704      General Information   Behavior/Cognition Alert;Cooperative;Pleasant mood   Patient Positioning Upright in chair   Oral care provided N/A     Treatment Provided   Treatment provided Cognitive-Linquistic;Dysphagia     Dysphagia Treatment   Temperature Spikes Noted No   Respiratory Status Room air   Oral Cavity - Dentition Adequate natural dentition   Treatment Methods Skilled observation;Upgraded PO texture trial;Therapeutic exercise;Compensation strategy training;Patient/caregiver education   Patient observed directly with PO's Yes   Type of PO's observed Dysphagia 1 (puree);Thin liquids   Feeding Total assist;Needs assist   Liquids provided via Cup;Straw   Oral Phase Signs & Symptoms Anterior loss/spillage;Prolonged bolus formation   Pharyngeal Phase Signs & Symptoms Immediate cough;Wet vocal quality   Type of cueing Verbal;Tactile;Visual   Amount of cueing Moderate   Other treatment/comments Patient's husband performed oral care in session to maximize safety for PO trials. Pt tolerates thin liquids with appearance of timely swallow, requires moderate verbal cues to initiate subsequent dry swallow for secretion management. Anterior oral spillage in 60% of trials. Improves with verbal cues to close mouth. Noted with immediate cough x1 following significant anterior spillage. SLP facilitated straw use; patient initially requires verbal cues for lip closure to  retrieve bolus. No overt s/sx of aspiration with single sips; however cough/wet vocal quality x1 after consecutive straw sips suggestive of reduced airway protection. Straw eliminates anterior spillage, however patient does require clinican assistance to moderate bolus flow with straw removal to limit bolus size. No overt signs of aspiration with pureed solids.     Pain Assessment   Pain Assessment No/denies pain     Cognitive-Linquistic Treatment   Treatment focused on Dysarthria;Voice   Skilled Treatment Targeted speech intelligibility, breath support and  pacing, and overarticulation with functional phrases of 2-7 words. Uses trained strategies with single written cue in 60% of trials; with min-moderate verbal cues for remainder of trials. Articulation and voicing improve with verbal cue to swallow prior to speech. Targeted short phrase leve spontaneous responses; 90% accuracy with mod verbal cues for breath support, swallow to improve articulation.      Tracheostomy Shiley 4 mm Uncuffed   Tracheostomy Properties Placement Date: 02/24/16 Placement Time: 0927 Placed By: ICU physician Brand: Shiley Size (mm): 4 mm Style: Uncuffed   Status Capped;Secured   Site Assessment Clean;Dry   Ties Assessment Clean;Dry;Secure     Assessment / Recommendations / Plan   Plan Continue with current plan of care     Dysphagia Recommendations   Diet recommendations Dysphagia 1 (puree);Thin liquid   Liquids provided via Cup;Teaspoon   Medication Administration Via alternative means   Supervision Full supervision/cueing for compensatory strategies;Trained caregiver to feed patient   Compensations Effortful swallow;Multiple dry swallows after each bite/sip;Check for anterior loss;Small sips/bites;Slow rate   Postural Changes and/or Swallow Maneuvers Seated upright 90 degrees     General Recommendations   Oral Care Recommendations Oral care before and after PO;Staff/trained caregiver to provide oral care   Follow up Recommendations Outpatient SLP     Progression Toward Goals   Progression toward goals Progressing toward goals          SLP Education - 04/12/16 1720    Education provided Yes   Education Details Monitoring for signs of aspiration during feeding and ceasing comfort feeds if observed. Saliva management   Person(s) Educated Patient;Child(ren);Spouse   Methods Explanation   Comprehension Verbalized understanding          SLP Short Term Goals - 04/12/16 1727      SLP SHORT TERM GOAL #1   Title Patient will utilize adequate breath support,  slow rate and visual cues from communication partner/AAC to communicate wants, needs, thoughts and ideas.   Baseline 03/15/16, 04/05/16, 04/12/16   Time 1   Period Weeks   Status On-going     SLP SHORT TERM GOAL #2   Title Patient will utilize intelligibility strategies, breath support with mod A for phrase repetition in 75% of trials.   Baseline 2.12.18, 04/05/16, 04/08/16, 04/12/16   Time 1   Period Weeks   Status Achieved     SLP SHORT TERM GOAL #3   Title Pt will swallow thin liquids, purees and soft solids in 9/10 trials with no overt signs of aspiration.   Baseline 2.12.18, 03-12-16, 03-15-16   Time 4   Period Weeks   Status Achieved     SLP SHORT TERM GOAL #4   Title Patient will tolerate 6 oz pleasure feeds of pureed solids and thin liquids with no overt signs of aspiration.   Time 1   Period Weeks   Status On-going          SLP Long Term Goals - 04/12/16 1729  SLP LONG TERM GOAL #1   Title Patient will utilize multimodal communication/AAC to clarify wants, needs, thoughts or ideas during communication breakdowns with max A   Time 2   Period Weeks   Status Deferred     SLP LONG TERM GOAL #2   Title Patient will utilize intelligibility strategies, breath support with min A for 5 word functional phrases with 80% accuracy.   Time 1   Period Weeks   Status On-going     SLP LONG TERM GOAL #3   Title Patient will demonstrate readiness for MBS to determine safety for pleasure feeds by timely initiation of swallow in 3/5 trials with ice chips/H20.   Baseline 2.12.18   Time 2   Period Weeks   Status Achieved     SLP LONG TERM GOAL #4   Title Patient will tolerate pleasure feeds of 8 oz thin liquids, purees or soft solids.   Time 1   Period Weeks   Status On-going          Plan - 04/12/16 1723    Clinical Impression Statement Patient with sustained alertness and attention to session tasks, remained alert for the entire sessiony. Continues to present with need  for external cuing for management of saliva, though she is observed to independently swallow to clear saliva on several occasions. Recommended continuing pleasure feeds of purees, liquids only, monitoring for overt signs of aspiration and ceasing feeding if observed. Continue current methods of bolus delivery at home (cup, spoon). Rate of speech, voicing continue to improve, particularly with cues for secretion management. Recommend skilled ST in order to maximize functional communication, swallowing safety, reduce caregiver burden and improve quality of life.     Speech Therapy Frequency 2x / week   Duration Other (comment)   Treatment/Interventions Aspiration precaution training;Trials of upgraded texture/liquids;Compensatory strategies;Functional tasks;Patient/family education;Cueing hierarchy;Diet toleration management by SLP;Cognitive reorganization;Compensatory techniques;SLP instruction and feedback;Internal/external aids;Multimodal communcation approach   Potential to Achieve Goals Good   Potential Considerations Ability to learn/carryover information;Co-morbidities;Severity of impairments   SLP Home Exercise Plan comfort feeds purees/liquids only with oral care prior to and after feeding, functional and phrase level tasks for breath pacing   Consulted and Agree with Plan of Care Family member/caregiver;Patient   Family Member Consulted husband [name], daughter Tomasa Hosteller      Patient will benefit from skilled therapeutic intervention in order to improve the following deficits and impairments:   Dysphagia, oropharyngeal phase  Cognitive communication deficit  Dysarthria and anarthria    Problem List Patient Active Problem List   Diagnosis Date Noted  . Heterotopic ossification of bone 03/10/2016  . Acute on chronic respiratory failure with hypoxia ([place])   . Acute hypoxemic respiratory failure ([place])   . Acute blood loss anemia   . Atelectasis 02/04/2016  . Debility 02/03/2016  .  Pneumonia of both lower lobes due to Pseudomonas species ([place])   . Pneumonia of both lower lobes due to methicillin susceptible Staphylococcus aureus (MSSA) ([place])   . Acute respiratory failure with hypoxia ([place])   . Acute encephalopathy   . Seizures ([place])   . Sepsis ([place])   . Hypoxia   . Pain   . HCAP (healthcare-associated pneumonia) 12/28/2015  . Chronic respiratory failure ([place]) 12/28/2015  . Acute tracheobronchitis 12/24/2015  . Tracheostomy tube present ([place])   . Tracheal stenosis   . Chronic respiratory failure with hypoxia ([place])   . 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[place]) 10/20/2015  . Tracheostomy status ([place]) 09/26/2015  . Allergic rhinitis 09/26/2015  .  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 Movement disorder 09/22/2015  . Encephalitis 09/22/2015  . Chest pain 02/07/2014  . Premature atrial contractions 01/13/2014  . Abnormality of gait 12/04/2013  . Hyperlipidemia 12/21/2011  . Cardiovascular risk factor 12/21/2011  . Bunion 01/31/2008  . Metatarsalgia of both feet 09/20/2007  . FLAT FOOT 09/20/2007  . HALLUX RIGIDUS, ACQUIRED 09/20/2007   Deneise Lever, [place] CF-SLP Speech-Language Pathologist  Aliene Altes 04/12/2016, 5:30 PM  [facility] [address] [place], [place], [address] Phone: [phone]   Fax:  [phone]   Name: MAHSA HANSER MRN: [id number] Date of Birth: May 06, 1950

## 2016-04-15 ENCOUNTER — Ambulatory Visit: Payer: BC Managed Care – PPO | Admitting: Occupational Therapy

## 2016-04-15 ENCOUNTER — Encounter: Payer: Self-pay | Admitting: Occupational Therapy

## 2016-04-15 ENCOUNTER — Ambulatory Visit: Payer: BC Managed Care – PPO | Admitting: Rehabilitation

## 2016-04-15 ENCOUNTER — Encounter: Payer: Self-pay | Admitting: Rehabilitation

## 2016-04-15 ENCOUNTER — Ambulatory Visit: Payer: BC Managed Care – PPO

## 2016-04-15 DIAGNOSIS — R293 Abnormal posture: Secondary | ICD-10-CM

## 2016-04-15 DIAGNOSIS — R41841 Cognitive communication deficit: Secondary | ICD-10-CM

## 2016-04-15 DIAGNOSIS — M25611 Stiffness of right shoulder, not elsewhere classified: Secondary | ICD-10-CM

## 2016-04-15 DIAGNOSIS — M6281 Muscle weakness (generalized): Secondary | ICD-10-CM

## 2016-04-15 DIAGNOSIS — R2681 Unsteadiness on feet: Secondary | ICD-10-CM

## 2016-04-15 DIAGNOSIS — R41844 Frontal lobe and executive function deficit: Secondary | ICD-10-CM

## 2016-04-15 DIAGNOSIS — R29818 Other symptoms and signs involving the nervous system: Secondary | ICD-10-CM

## 2016-04-15 DIAGNOSIS — M25622 Stiffness of left elbow, not elsewhere classified: Secondary | ICD-10-CM

## 2016-04-15 DIAGNOSIS — R1312 Dysphagia, oropharyngeal phase: Secondary | ICD-10-CM

## 2016-04-15 DIAGNOSIS — R471 Dysarthria and anarthria: Secondary | ICD-10-CM

## 2016-04-15 DIAGNOSIS — M25612 Stiffness of left shoulder, not elsewhere classified: Secondary | ICD-10-CM

## 2016-04-15 DIAGNOSIS — R2689 Other abnormalities of gait and mobility: Secondary | ICD-10-CM

## 2016-04-15 NOTE — Therapy (Signed)
[facility] [address] [place], [place], [address] Phone: [phone]   Fax:  [phone]  Physical Therapy Treatment  Patient Details  Name: Judy Spears MRN: [id number] Date of Birth: 1950/09/25 Referring Provider: Alger Simons, MD/Andrew Letta Pate, MD  Encounter Date: 04/15/2016      PT End of Session - 04/15/16 1443    Visit Number 14   Number of Visits 20   Date for PT Re-Evaluation 04/30/16   Authorization Type [employer]   PT Start Time 1318   PT Stop Time 1405   PT Time Calculation (min) 47 min   Activity Tolerance Patient tolerated treatment well   Behavior During Therapy [facility] for tasks assessed/performed      Past Medical History:  Diagnosis Date  . Arthritis    lt great toe  . Complication of anesthesia    pt has had headaches post op-did advise to hydra well preop  . Hair loss    right-sided  . History of exercise stress test    a. ETT (12/15) with 12:00 exercise, no ST changes, occasional PACs.   . Hyperlipidemia   . Insomnia   . Migraine headache   . 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 Postmenopausal   . Premature atrial contractions    a. Holter (12/15) with 8% PACs, no atrial runs or atrial fibrillation.     Past Surgical History:  Procedure Laterality Date  . BREAST BIOPSY  01/01/2011   Procedure: BREAST BIOPSY WITH NEEDLE LOCALIZATION;  Surgeon: Rolm Bookbinder, MD;  Location: [facility];  Service: General;  Laterality: Left;  left breast wire localization biopsy  . cataracts right eye    . CESAREAN SECTION  1986  . HERNIA REPAIR  2000   RIH  . opirectinal membrane peel    . RHINOPLASTY  1983    There were no vitals filed for this visit.      Subjective Assessment - 04/15/16 1416    Subjective Husband [name] reports things are going well at home, no changes to report.    Patient is accompained by: Family member   Pertinent History Precautions:  PEG.   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  Another round of botox in LLE on 02/06/16   Patient Stated Goals Per husband, "For [place] to be as independent as possible."   Currently in Pain? No/denies                         [facility] Adult PT Treatment/Exercise - 04/15/16 0001      Transfers   Transfers Sit to Stand;Stand to Sit;Stand Pivot Transfers   Sit to Stand 3: Mod assist  from standard arm chair   Sit to Stand Details Tactile cues for sequencing;Verbal cues for technique;Tactile cues for posture;Tactile cues for weight shifting;Manual facilitation for weight shifting;Manual facilitation for placement   Sit to Stand Details (indicate cue type and reason) Continues to varying amount of assist, but mostly mod A today from standard arm chair, esp in wide chair as she was unable to use UEs.    Stand to Sit 3: Mod assist;2: Max assist   Stand to Sit Details (indicate cue type and reason) Manual facilitation for weight shifting;Tactile cues for weight shifting;Verbal cues for technique;Tactile cues  for sequencing;Tactile cues for initiation;Verbal cues for sequencing;Manual facilitation for weight bearing   Stand to Sit Details Assist to manage UEs off of Eva walker and mod A to facilitate improved forward trunk flexion and controlled sitting.     Stand Pivot Transfers 4: Min assist;3: Mod assist   Stand Pivot Transfer Details (indicate cue type and reason) Performed stand pivot w/c<>mat table with min A initially with great sequencing and stepping, however requires mod A back to w/c due to fatigue and increaesd difficulty moving to the R and maintaining upright posture.      Ambulation/Gait   Ambulation/Gait Yes   Ambulation/Gait Assistance 3: Mod assist;1: +2 Total assist  +2 to guide Eva walker  and assist at trunk intermittently   Ambulation/Gait Assistance Details Trialed use of Harmon Pier walker today during session (weighted with 3 weights, unsure of amount for increased stability).  Performed gait x 80', x 85' x 115' with mod A (did have +2 in order to steer RW and help intermittently for trunk control).  Note that during second and third trial used theraband at L ankle in order to decrease scissoring (L LE adduction) which seemed to assist somewhat, however did note that then she would tend to adduct RLE (seems to be perceptually related).  Also note that due to position of hands/forearms she would tend to bias in forward flexed position, therefore during last trial of gait, built up hand grips in order to bring arms more posteriorly to maintain upright posture.  This did help somewhat however it was difficult for her to maintain position due to thickness of build out.  Did note multiple times in which she demonstrated marked improvement in B hip protraction during stance, but was difficult to maintain.    Ambulation Distance (Feet) --  see above   Assistive device Eva walker   Gait Pattern Decreased dorsiflexion - right;Decreased hip/knee flexion - right;Right flexed knee in stance;Left flexed knee in stance;Right foot flat;Left foot flat;Scissoring;Lateral trunk lean to left;Decreased weight shift to right;Narrow base of support;Poor foot clearance - right;Decreased trunk rotation   Ambulation Surface Level;Indoor                PT Education - 04/15/16 1442    Education provided Yes   Education Details goals with use of Harmon Pier walker   Person(s) Educated Patient;Spouse;Caregiver(s)   Methods Explanation   Comprehension Verbalized understanding          PT Short Term Goals - 03/30/16 2105      PT SHORT TERM GOAL #1   Title Pt will consistently perform supine rolling in B directions with min A (with rails as needed) in order to indicate increased trunk dissociation, increased  independence with bed mobility.  (Target date for all STGs: 03/29/16)   Baseline rolled to R with CGA to min assist on 03-30-16; min assist from supine to Lt sidelying   Status Achieved     PT SHORT TERM GOAL #2   Title Pt will consistently perform supine <> sit with min A to indicate increased independence getting out of bed.    Baseline min to mod assist needed on 03-30-16   Status On-going     PT SHORT TERM GOAL #3   Title Pt will demonstrate midline posture in static sitting with BLE support and RUE support at a S level to indicate increased postural awareness/control.     Baseline met 03-30-16   Status Achieved     PT SHORT TERM GOAL #  4   Title Pt will consistently perform functional transfers from w/c <> mat table with mod A to decrease caregiver burden.     Baseline mod assist with stand pivot transfers - 03-30-16   Status Achieved     PT SHORT TERM GOAL #5   Title Pt will consistently perform sit <> stand from personal w/c with BUE support and mod A to decrease caregiver burden.     Baseline met 03-30-16   Status Achieved     PT SHORT TERM GOAL #6   Title Pt will perform static standing with BUE support x1 minute with supervision while in stedy, no overt LOB to decrease caregiver burden.    Baseline pt able to stand unsupported with mod to min assist - not using Stedy in PT  (03-30-16)   Status Achieved           PT Long Term Goals - 03/01/16 1641      PT LONG TERM GOAL #1   Title Pt will perform supine <> sit with supervision 75% of the time to indicate increased independence getting out of bed.   (Target for all LTGs: 04/26/16)   Time 8   Period Weeks   Status New     PT LONG TERM GOAL #2   Title Pt will demonstrate midline posture in static sitting with BLE support without UE support to indicate increased postural awareness/control.    Time 8   Period Weeks   Status New     PT LONG TERM GOAL #3   Title Pt will consistently perform functional transfers at min A level 75% of  the time to decrease caregiver burden.     Time 8   Period Weeks   Status New     PT LONG TERM GOAL #4   Title Pt will perform sit <> stand from personal w/c with BUE support and min A, 75% of time to decrease caregiver burden.     Time 8   Period Weeks   Status New     PT LONG TERM GOAL #5   Title Pt will demonstrate ability to ambulate up to 5' w/ LRAD with max A in order to better assist with getting to/from restroom to decrease burden of care.    Time 8   Period Weeks   Status New               Plan - 04/15/16 1444    Clinical Impression Statement Skilled session focused on trial use of Harmon Pier walker in order to determine if this would be more appropriate AD for gait.  Note that she intially did very well, however feel that with increased fatigue and due to perceptual deficits, it was increasingly difficult to maintain upright posture and moving LEs without narrow BOS.     Rehab Potential Fair   Clinical Impairments Affecting Rehab Potential Chronicity of condition, continued medical management, but has very strong family and financial support   PT Frequency 2x / week   PT Duration 8 weeks   PT Treatment/Interventions ADLs/Self Care Home Management;DME Instruction;Gait training;Stair training;Functional mobility training;Therapeutic activities;Patient/family education;Cognitive remediation;Neuromuscular re-education;Therapeutic exercise;Balance training;Orthotic Fit/Training;Wheelchair mobility training;Manual techniques;Splinting;Visual/perceptual remediation/compensation;Vestibular;Passive range of motion;Electrical Stimulation   PT Next Visit Plan cont. transfer training, gait and balance training; activities to fascilitate Lt trunk elongation   Consulted and Agree with Plan of Care Patient;Family member/caregiver   Family Member Consulted caregiver, [name] and husband [name]      Patient will benefit from skilled therapeutic  intervention in order to improve the following  deficits and impairments:  Abnormal gait, Decreased balance, Decreased endurance, Decreased cognition, Cardiopulmonary status limiting activity, Decreased activity tolerance, Decreased coordination, Decreased strength, Impaired flexibility, Impaired tone, Decreased mobility, Increased muscle spasms, Impaired vision/preception, Decreased range of motion, Impaired UE functional use, Postural dysfunction, Improper body mechanics, Impaired perceived functional ability, Decreased knowledge of precautions, Decreased knowledge of use of DME  Visit Diagnosis: Unsteadiness on feet  Abnormal posture  Muscle weakness (generalized)  Other abnormalities of gait and mobility     Problem List Patient Active Problem List   Diagnosis Date Noted  . Heterotopic ossification of bone 03/10/2016  . Acute on chronic respiratory failure with hypoxia ([place])   . Acute hypoxemic respiratory failure ([place])   . Acute blood loss anemia   . Atelectasis 02/04/2016  . Debility 02/03/2016  . Pneumonia of both lower lobes due to Pseudomonas species ([place])   . Pneumonia of both lower lobes due to methicillin susceptible Staphylococcus aureus (MSSA) ([place])   . Acute respiratory failure with hypoxia ([place])   . Acute encephalopathy   . Seizures ([place])   . Sepsis ([place])   . Hypoxia   . Pain   . HCAP (healthcare-associated pneumonia) 12/28/2015  . Chronic respiratory failure ([place]) 12/28/2015  . Acute tracheobronchitis 12/24/2015  . Tracheostomy tube present ([place])   . Tracheal stenosis   . Chronic respiratory failure with hypoxia ([place])   . 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[place]) 10/20/2015  . Tracheostomy status ([place]) 09/26/2015  . Allergic rhinitis 09/26/2015  . 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 Movement disorder 09/22/2015  . Encephalitis 09/22/2015  . Chest pain 02/07/2014  . Premature atrial contractions 01/13/2014  . Abnormality of gait 12/04/2013  . Hyperlipidemia 12/21/2011  . Cardiovascular risk factor 12/21/2011  . Bunion  01/31/2008  . Metatarsalgia of both feet 09/20/2007  . FLAT FOOT 09/20/2007  . HALLUX RIGIDUS, ACQUIRED 09/20/2007    Cameron Sprang, PT, MPT [facility] [address] [place], [place], [address] Phone: [phone]   Fax:  [phone] 04/15/16, 2:54 PM   Name: KARIYA LAVERGNE MRN: [id number] Date of Birth: Jan 20, 1951

## 2016-04-15 NOTE — Therapy (Signed)
[facility] [address] [place], [place], [address] Phone: [phone]   Fax:  [phone]  Occupational Therapy Treatment  Patient Details  Name: Judy Spears MRN: [id number] Date of Birth: 1950/09/19 Referring Provider: Dr. Quita Skye. [name]  Encounter Date: 04/15/2016      OT End of Session - 04/15/16 1547    Visit Number 13   Number of Visits 16   Date for OT Re-Evaluation 04/26/16   Authorization Type [employer] unlimited visits   OT Start Time 8315   OT Stop Time 1531   OT Time Calculation (min) 44 min   Activity Tolerance Patient tolerated treatment well      Past Medical History:  Diagnosis Date  . Arthritis    lt great toe  . Complication of anesthesia    pt has had headaches post op-did advise to hydra well preop  . Hair loss    right-sided  . History of exercise stress test    a. ETT (12/15) with 12:00 exercise, no ST changes, occasional PACs.   . Hyperlipidemia   . Insomnia   . Migraine headache   . 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 Postmenopausal   . Premature atrial contractions    a. Holter (12/15) with 8% PACs, no atrial runs or atrial fibrillation.     Past Surgical History:  Procedure Laterality Date  . BREAST BIOPSY  01/01/2011   Procedure: BREAST BIOPSY WITH NEEDLE LOCALIZATION;  Surgeon: Rolm Bookbinder, MD;  Location: [facility];  Service: General;  Laterality: Left;  left breast wire localization biopsy  . cataracts right eye    . CESAREAN SECTION  1986  . HERNIA REPAIR  2000   RIH  . opirectinal membrane peel    . RHINOPLASTY  1983    There were no vitals filed for this visit.      Subjective Assessment - 04/15/16 1451    Subjective  I like that (When asked about the [place] walker)   Patient is accompained by: Family member  husband, [name] and caregiver [name]   Pertinent History see epic snapshot - ABI/encephalitis/hypoxia from virus; recent  hospitalization due to ARF;    Patient Stated Goals Pt unable to state.    Currently in Pain? No/denies                      OT Treatments/Exercises (OP) - 04/15/16 1534      Neurological Re-education Exercises   Other Exercises 1 Neuro re ed to address UE functional use in the context of a functional activity (donning and doffing shirt).  Pt requires max cues to look at the shirt and look at her hands when attempting to grab the shirt.  Pt displays grasp reflex B hands (Right more pronounced than left), perseverative non purposeful movement) , utilization behavior in both hands.  Pt also needs mod vc's for attention to task. Pt benefits from backward chaining approach and with this approach, task interruption to decrease persevertive movements and cueing to open and use whole hand, pt able to pull shirt onto each arm as well as take off each arm.  Pt also able to assist in pulling down shirt in front.  Attempted further assesment of vision - pt is able to report blurry and double vision in R lower quandrant. Pt also displays depth perception issues and is able to state that it sometimes makes things harder to try and use  her vision.  Discussed possible repeat neuro opthamalogy assessment given that last one was almost a year ago and pt is wearing prism glasses with prescription from then.  Name of MD given to pt and husband and husband states they will follow up.                   OT Short Term Goals - 04/15/16 1544      OT SHORT TERM GOAL #1   Title Pt, husband and cargiver will be mod I with HEP - 03/29/2016   Status Achieved     OT SHORT TERM GOAL #2   Title Pt will be able to sit with supervision without UE support and vc's to assist with midline 75% while engage in functional task   Status Achieved     OT SHORT TERM GOAL #3   Title Pt will be able to complete sit to stand consistently with mod A for toilet transfers (max a for pivot)   Status Achieved     OT  SHORT TERM GOAL #4   Title Pt, husband and caregiver will verbalize understanding of various bathroom accomodations to reduce long term caregiver burden   Status Achieved           OT Long Term Goals - 04/15/16 [place] #1   Title Pt, husband and caregiver will be mod I with upgraded HEP prn - 04/26/2016   Status On-going     OT LONG TERM GOAL #2   Title Pt, husband and caregiver will be mod I with splint wear and care    Status Achieved     OT LONG TERM GOAL #3   Title Pt will be able to wash face with min a only   Status On-going     OT LONG TERM GOAL #4   Title Pt will be able to sit without UE support with supevision 100% of the time with no more than 2 vc's while engaged in functional activity.   Status Achieved     OT LONG TERM GOAL #5   Title Pt, husband and caregiver will verbalize understanding of potential after care therapeutic opportunities.    Status On-going               Plan - 04/15/16 1544    Clinical Impression Statement Pt able to particpate in UB dressing today.  Husband stated that he is very happy with pt's progress   Rehab Potential Fair   Clinical Impairments Affecting Rehab Potential severity of deficits, very slow rate of recovery   OT Frequency 2x / week   OT Duration 8 weeks   OT Treatment/Interventions Self-care/ADL training;Ultrasound;Moist Heat;Electrical Stimulation;DME and/or AE instruction;Neuromuscular education;Therapeutic exercise;Functional Mobility Training;Manual Therapy;Passive range of motion;Splinting;Therapeutic activities;Balance training;Patient/family education;Visual/perceptual remediation/compensation;Cognitive remediation/compensation   Plan functional use of UE's, NMR for UE's, trunk,LE's, static to dynamic standing balance, finding midline, functional mobility   Consulted and Agree with Plan of Care Patient;Family member/caregiver   Family Member Consulted caregiver [name], husband [name]        Patient will benefit from skilled therapeutic intervention in order to improve the following deficits and impairments:  Decreased endurance, Decreased skin integrity, Impaired vision/preception, Improper body mechanics, Impaired perceived functional ability, Decreased knowledge of precautions, Cardiopulmonary status limiting activity, Decreased activity tolerance, Decreased knowledge of use of DME, Decreased strength, Impaired flexibility, Improper spinal/pelvic alignment, Impaired sensation, Difficulty walking, Decreased mobility, Decreased balance, Decreased cognition, Decreased range of motion,  Impaired tone, Impaired UE functional use, Decreased safety awareness, Decreased coordination, Pain  Visit Diagnosis: Unsteadiness on feet  Abnormal posture  Muscle weakness (generalized)  Other symptoms and signs involving the nervous system  Stiffness of right shoulder, not elsewhere classified  Stiffness of left shoulder, not elsewhere classified  Stiffness of left elbow, not elsewhere classified  Frontal lobe and executive function deficit    Problem List Patient Active Problem List   Diagnosis Date Noted  . Heterotopic ossification of bone 03/10/2016  . Acute on chronic respiratory failure with hypoxia ([facility])   . Acute hypoxemic respiratory failure ([facility])   . Acute blood loss anemia   . Atelectasis 02/04/2016  . Debility 02/03/2016  . Pneumonia of both lower lobes due to Pseudomonas species ([facility])   . Pneumonia of both lower lobes due to methicillin susceptible Staphylococcus aureus (MSSA) ([facility])   . Acute respiratory failure with hypoxia ([place] [facility])   . Acute encephalopathy   . Seizures ([facility])   . Sepsis ([facility])   . Hypoxia   . Pain   . HCAP (healthcare-associated pneumonia) 12/28/2015  . Chronic respiratory failure ([facility]) 12/28/2015  . Acute tracheobronchitis 12/24/2015  . Tracheostomy tube present ([place] [facility])   . Tracheal stenosis   . Chronic respiratory failure with hypoxia ([facility])   .  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[place]) 10/20/2015  . Tracheostomy status ([place] [place]) 09/26/2015  . Allergic rhinitis 09/26/2015  . 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 Movement disorder 09/22/2015  . Encephalitis 09/22/2015  . Chest pain 02/07/2014  . Premature atrial contractions 01/13/2014  . Abnormality of gait 12/04/2013  . Hyperlipidemia 12/21/2011  . Cardiovascular risk factor 12/21/2011  . Bunion 01/31/2008  . Metatarsalgia of both feet 09/20/2007  . FLAT FOOT 09/20/2007  . HALLUX RIGIDUS, ACQUIRED 09/20/2007    Quay Burow, OTR/L 04/15/2016, 3:49 PM  [facility] [address] [place], [place], [address] Phone: [phone]   Fax:  [phone]  Name: Judy Spears MRN: [id number] Date of Birth: 1950-03-04

## 2016-04-16 NOTE — Therapy (Signed)
[facility] [address] [place], [place], [address] Phone: [phone]   Fax:  [phone]  Speech Language Pathology Treatment  Patient Details  Name: Judy Spears MRN: [id number] Date of Birth: 1950/04/24 Referring Provider: Charlett Blake, MD  Encounter Date: 04/15/2016      End of Session - 04/16/16 1740    Visit Number 14   Number of Visits 17   Date for SLP Re-Evaluation 04/30/16   SLP Start Time 1406   SLP Stop Time  1446   SLP Time Calculation (min) 40 min   Activity Tolerance Patient tolerated treatment well      Past Medical History:  Diagnosis Date  . Arthritis    lt great toe  . Complication of anesthesia    pt has had headaches post op-did advise to hydra well preop  . Hair loss    right-sided  . History of exercise stress test    a. ETT (12/15) with 12:00 exercise, no ST changes, occasional PACs.   . Hyperlipidemia   . Insomnia   . Migraine headache   . 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 Postmenopausal   . Premature atrial contractions    a. Holter (12/15) with 8% PACs, no atrial runs or atrial fibrillation.     Past Surgical History:  Procedure Laterality Date  . BREAST BIOPSY  01/01/2011   Procedure: BREAST BIOPSY WITH NEEDLE LOCALIZATION;  Surgeon: Rolm Bookbinder, MD;  Location: [facility];  Service: General;  Laterality: Left;  left breast wire localization biopsy  . cataracts right eye    . CESAREAN SECTION  1986  . HERNIA REPAIR  2000   RIH  . opirectinal membrane peel    . RHINOPLASTY  1983    There were no vitals filed for this visit.      Subjective Assessment - 04/16/16 1732    Subjective arrives to ST after being up in walker in PT.   Patient is accompained by: --  Cihp and [name]   Currently in Pain? No/denies               ADULT SLP TREATMENT - 04/16/16 0001      General Information   Behavior/Cognition  Alert;Cooperative;Pleasant mood;Distractible;Requires cueing   Patient Positioning Upright in chair     Treatment Provided   Treatment provided Cognitive-Linquistic     Dysphagia Treatment   Temperature Spikes Noted No   Respiratory Status Room air   Oral Cavity - Dentition Adequate natural dentition   Treatment Methods Skilled observation   Patient observed directly with PO's Yes   Type of PO's observed Thin liquids   Feeding Total assist   Liquids provided via Straw;Teaspoon   Oral Phase Signs & Symptoms Anterior loss/spillage;Prolonged bolus formation   Pharyngeal Phase Signs & Symptoms Immediate cough;Wet vocal quality   Type of cueing Verbal;Tactile;Visual   Amount of cueing Moderate   Other treatment/comments SLP attempted pt with straw sips to mitigate anterior labial loss with controlled sips from SLP. Caergiver performed oral care prior to water. Pt req'd tactile, visual, verbal cues for lip protrusion for straw use. When obtained, pt had two mod size sips and coughed immdiately while SLP removed straw from pt's oral cavity. SLP waited until pt coughed additionally in attemptes to clear likely aspirated material. When pt stated it felt like aspiration was removed, tx continued with 3/4 teaspoon boluses on spoon with SLP presenting to pt. No  more overt s/s aspiration occurred. SLP reminded pt/husband and caregiver of overt s/s aspiration PNA.  Pt with cues necessary for second swallow, consistently. Pt also req'd SLP cues to remain on task (for second swallow).      Tracheostomy Shiley 4 mm Uncuffed   Tracheostomy Properties Placement Date: 02/24/16 Placement Time: [place] By: ICU physician Brand: Shiley Size (mm): 4 mm Style: Uncuffed     Assessment / Recommendations / Plan   Plan Continue with current plan of care     Dysphagia Recommendations   Diet recommendations Dysphagia 1 (puree);Thin liquid   Liquids provided via Cup;Teaspoon   Medication Administration Via  alternative means   Supervision Full supervision/cueing for compensatory strategies;Trained caregiver to feed patient   Compensations Effortful swallow;Multiple dry swallows after each bite/sip;Check for anterior loss;Small sips/bites;Slow rate   Postural Changes and/or Swallow Maneuvers Seated upright 90 degrees     Progression Toward Goals   Progression toward goals Progressing toward goals            SLP Short Term Goals - 04/12/16 1727      SLP SHORT TERM GOAL #1   Title Patient will utilize adequate breath support, slow rate and visual cues from communication partner/AAC to communicate wants, needs, thoughts and ideas.   Baseline 03/15/16, 04/05/16, 04/12/16   Time 1   Period Weeks   Status On-going     SLP SHORT TERM GOAL #2   Title Patient will utilize intelligibility strategies, breath support with mod A for phrase repetition in 75% of trials.   Baseline 2.12.18, 04/05/16, 04/08/16, 04/12/16   Time 1   Period Weeks   Status Achieved     SLP SHORT TERM GOAL #3   Title Pt will swallow thin liquids, purees and soft solids in 9/10 trials with no overt signs of aspiration.   Baseline 2.12.18, 03-12-16, 03-15-16   Time 4   Period Weeks   Status Achieved     SLP SHORT TERM GOAL #4   Title Patient will tolerate 6 oz pleasure feeds of pureed solids and thin liquids with no overt signs of aspiration.   Time 1   Period Weeks   Status On-going          SLP Long Term Goals - 04/16/16 1742      SLP LONG TERM GOAL #1   Title Patient will utilize multimodal communication/AAC to clarify wants, needs, thoughts or ideas during communication breakdowns with max A   Time 2   Period Weeks   Status Deferred     SLP LONG TERM GOAL #2   Title Patient will utilize intelligibility strategies, breath support with min A for 5 word functional phrases with 80% accuracy.   Time 1   Period Weeks   Status On-going     SLP LONG TERM GOAL #3   Title Patient will demonstrate readiness for  MBS to determine safety for pleasure feeds by timely initiation of swallow in 3/5 trials with ice chips/H20.   Baseline 2.12.18   Time 2   Period Weeks   Status Achieved     SLP LONG TERM GOAL #4   Title Patient will tolerate pleasure feeds of 8 oz thin liquids, purees or soft solids.   Time 1   Period Weeks   Status On-going          Plan - 04/16/16 1740    Clinical Impression Statement Patient with slight decr in sustained and selective attention to task today, remained alert  for the entire session. Continues to present with need for external cuing for management of saliva, though she is observed to independently swallow to clear saliva on several occasions. Recommended continuing pleasure feeds of purees, liquids only, monitoring for overt signs of aspiration and ceasing feeding if observed. Continue current methods of bolus delivery at home (cup, spoon). Recommend skilled ST in order to maximize functional communication, swallowing safety, reduce caregiver burden and improve quality of life.     Speech Therapy Frequency 2x / week   Duration Other (comment)   Treatment/Interventions Aspiration precaution training;Trials of upgraded texture/liquids;Compensatory strategies;Functional tasks;Patient/family education;Cueing hierarchy;Diet toleration management by SLP;Cognitive reorganization;Compensatory techniques;SLP instruction and feedback;Internal/external aids;Multimodal communcation approach   Potential to Achieve Goals Good   Potential Considerations Ability to learn/carryover information;Co-morbidities;Severity of impairments   SLP Home Exercise Plan comfort feeds purees/liquids only with oral care prior to and after feeding, functional and phrase level tasks for breath pacing   Consulted and Agree with Plan of Care Family member/caregiver;Patient   Family Member Consulted husband [name], daughter Tomasa Hosteller      Patient will benefit from skilled therapeutic intervention in order to  improve the following deficits and impairments:   Dysphagia, oropharyngeal phase  Cognitive communication deficit  Dysarthria and anarthria    Problem List Patient Active Problem List   Diagnosis Date Noted  . Heterotopic ossification of bone 03/10/2016  . Acute on chronic respiratory failure with hypoxia ([facility])   . Acute hypoxemic respiratory failure ([facility])   . Acute blood loss anemia   . Atelectasis 02/04/2016  . Debility 02/03/2016  . Pneumonia of both lower lobes due to Pseudomonas species ([facility])   . Pneumonia of both lower lobes due to methicillin susceptible Staphylococcus aureus (MSSA) ([facility])   . Acute respiratory failure with hypoxia ([facility])   . Acute encephalopathy   . Seizures ([place])   . Sepsis ([facility])   . Hypoxia   . Pain   . HCAP (healthcare-associated pneumonia) 12/28/2015  . Chronic respiratory failure ([facility]) 12/28/2015  . Acute tracheobronchitis 12/24/2015  . Tracheostomy tube present ([facility])   . Tracheal stenosis   . Chronic respiratory failure with hypoxia ([place])   . 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[place]) 10/20/2015  . Tracheostomy status ([facility]) 09/26/2015  . Allergic rhinitis 09/26/2015  . 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 Movement disorder 09/22/2015  . Encephalitis 09/22/2015  . Chest pain 02/07/2014  . Premature atrial contractions 01/13/2014  . Abnormality of gait 12/04/2013  . Hyperlipidemia 12/21/2011  . Cardiovascular risk factor 12/21/2011  . Bunion 01/31/2008  . Metatarsalgia of both feet 09/20/2007  . FLAT FOOT 09/20/2007  . HALLUX RIGIDUS, ACQUIRED 09/20/2007    [facility] ,MS, CCC-SLP  04/16/2016, 5:42 PM  [facility] [address] [place], [place], [address] Phone: [phone]   Fax:  [phone]   Name: Judy Spears MRN: [id number] Date of Birth: October 13, 1950

## 2016-04-19 ENCOUNTER — Ambulatory Visit: Payer: BC Managed Care – PPO | Admitting: Speech Pathology

## 2016-04-19 ENCOUNTER — Ambulatory Visit: Payer: BC Managed Care – PPO | Admitting: Occupational Therapy

## 2016-04-19 ENCOUNTER — Encounter: Payer: Self-pay | Admitting: Occupational Therapy

## 2016-04-19 ENCOUNTER — Ambulatory Visit: Payer: BC Managed Care – PPO | Admitting: Rehabilitation

## 2016-04-19 ENCOUNTER — Encounter: Payer: Self-pay | Admitting: Rehabilitation

## 2016-04-19 DIAGNOSIS — R41841 Cognitive communication deficit: Secondary | ICD-10-CM

## 2016-04-19 DIAGNOSIS — M25611 Stiffness of right shoulder, not elsewhere classified: Secondary | ICD-10-CM

## 2016-04-19 DIAGNOSIS — R1312 Dysphagia, oropharyngeal phase: Secondary | ICD-10-CM

## 2016-04-19 DIAGNOSIS — R293 Abnormal posture: Secondary | ICD-10-CM

## 2016-04-19 DIAGNOSIS — M6281 Muscle weakness (generalized): Secondary | ICD-10-CM

## 2016-04-19 DIAGNOSIS — R471 Dysarthria and anarthria: Secondary | ICD-10-CM

## 2016-04-19 DIAGNOSIS — R2681 Unsteadiness on feet: Secondary | ICD-10-CM

## 2016-04-19 DIAGNOSIS — M25622 Stiffness of left elbow, not elsewhere classified: Secondary | ICD-10-CM

## 2016-04-19 DIAGNOSIS — R41844 Frontal lobe and executive function deficit: Secondary | ICD-10-CM

## 2016-04-19 DIAGNOSIS — R2689 Other abnormalities of gait and mobility: Secondary | ICD-10-CM

## 2016-04-19 DIAGNOSIS — R29818 Other symptoms and signs involving the nervous system: Secondary | ICD-10-CM

## 2016-04-19 DIAGNOSIS — M25612 Stiffness of left shoulder, not elsewhere classified: Secondary | ICD-10-CM

## 2016-04-19 NOTE — Therapy (Signed)
[facility] [address] [place], [place], [address] Phone: [phone]   Fax:  [phone]  Physical Therapy Treatment  Patient Details  Name: Judy Spears MRN: [id number] Date of Birth: 03-07-1950 Referring Provider: Alger Simons, MD/Andrew Letta Pate, MD  Encounter Date: 04/19/2016      PT End of Session - 04/19/16 1520    Visit Number 15   Number of Visits 20   Date for PT Re-Evaluation 04/30/16   Authorization Type [employer]   PT Start Time 1316   PT Stop Time 1400   PT Time Calculation (min) 44 min   Activity Tolerance Patient tolerated treatment well   Behavior During Therapy [facility] for tasks assessed/performed      Past Medical History:  Diagnosis Date  . Arthritis    lt great toe  . Complication of anesthesia    pt has had headaches post op-did advise to hydra well preop  . Hair loss    right-sided  . History of exercise stress test    a. ETT (12/15) with 12:00 exercise, no ST changes, occasional PACs.   . Hyperlipidemia   . Insomnia   . Migraine headache   . 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 Postmenopausal   . Premature atrial contractions    a. Holter (12/15) with 8% PACs, no atrial runs or atrial fibrillation.     Past Surgical History:  Procedure Laterality Date  . BREAST BIOPSY  01/01/2011   Procedure: BREAST BIOPSY WITH NEEDLE LOCALIZATION;  Surgeon: Rolm Bookbinder, MD;  Location: [place];  Service: General;  Laterality: Left;  left breast wire localization biopsy  . cataracts right eye    . CESAREAN SECTION  1986  . HERNIA REPAIR  2000   RIH  . opirectinal membrane peel    . RHINOPLASTY  1983    There were no vitals filed for this visit.      Subjective Assessment - 04/19/16 1518    Subjective Husband reports he has not contacted Raquel S[facility] at [facility] yet.  Reports they are walking at home and no longer using the stedy at all.     Patient is  accompained by: Family member   Pertinent History Precautions:  PEG.  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  Another round of botox in LLE on 02/06/16   Limitations House hold activities;Walking;Standing;Sitting   Patient Stated Goals Per husband, "For [name] to be as independent as possible."   Currently in Pain? No/denies                                 PT Education - 04/19/16 1519    Education provided Yes   Education Details getting prone next session   Person(s) Educated Patient   Methods Explanation   Comprehension Verbalized understanding        NMR:  Had pt transfer with +2A to/from peanut ball in order to work on sitting balance, postural control, improved trunk dissociation, forward and lateral reaching and increasing L trunk elongation.  Pt able to tolerate sitting on ball entire session (approx 32 mins total) with 2-3 rest breaks in which PT allowed posterior lean.  Pt initially requires increased assist to reach for targets whether  placed anteriorly or laterally, however with increased practice and repetition, she was able to perform at min/mod A level (assist needed for full control of UE to reach for object).  Cues for open R hand when reaching as well as to attend visually to targets.  Note she seems to be better able to see objects to the R, however is able to discern color of objects on the L, but did note some undershooting when reaching.  While seated on ball also had pt work on lateral weight shifts for improved trunk movement and dissociation.  Pt able to perform movements, however PT did assist to increase amount of elongation, esp on the L.  Pt tolerated very well.           PT Short Term Goals - 03/30/16 2105      PT SHORT TERM GOAL #1   Title Pt will  consistently perform supine rolling in B directions with min A (with rails as needed) in order to indicate increased trunk dissociation, increased independence with bed mobility.  (Target date for all STGs: 03/29/16)   Baseline rolled to R with CGA to min assist on 03-30-16; min assist from supine to Lt sidelying   Status Achieved     PT SHORT TERM GOAL #2   Title Pt will consistently perform supine <> sit with min A to indicate increased independence getting out of bed.    Baseline min to mod assist needed on 03-30-16   Status On-going     PT SHORT TERM GOAL #3   Title Pt will demonstrate midline posture in static sitting with BLE support and RUE support at a S level to indicate increased postural awareness/control.     Baseline met 03-30-16   Status Achieved     PT SHORT TERM GOAL #4   Title Pt will consistently perform functional transfers from w/c <> mat table with mod A to decrease caregiver burden.     Baseline mod assist with stand pivot transfers - 03-30-16   Status Achieved     PT SHORT TERM GOAL #5   Title Pt will consistently perform sit <> stand from personal w/c with BUE support and mod A to decrease caregiver burden.     Baseline met 03-30-16   Status Achieved     PT SHORT TERM GOAL #6   Title Pt will perform static standing with BUE support x1 minute with supervision while in stedy, no overt LOB to decrease caregiver burden.    Baseline pt able to stand unsupported with mod to min assist - not using Stedy in PT  (03-30-16)   Status Achieved           PT Long Term Goals - 03/01/16 1641      PT LONG TERM GOAL #1   Title Pt will perform supine <> sit with supervision 75% of the time to indicate increased independence getting out of bed.   (Target for all LTGs: 04/26/16)   Time 8   Period Weeks   Status New     PT LONG TERM GOAL #2   Title Pt will demonstrate midline posture in static sitting with BLE support without UE support to indicate increased postural awareness/control.     Time 8   Period Weeks   Status New     PT LONG TERM GOAL #3   Title Pt will consistently perform functional transfers at min A level 75% of the time to decrease caregiver burden.  Time 8   Period Weeks   Status New     PT LONG TERM GOAL #4   Title Pt will perform sit <> stand from personal w/c with BUE support and min A, 75% of time to decrease caregiver burden.     Time 8   Period Weeks   Status New     PT LONG TERM GOAL #5   Title Pt will demonstrate ability to ambulate up to 5' w/ LRAD with max A in order to better assist with getting to/from restroom to decrease burden of care.    Time 8   Period Weeks   Status New               Plan - 04/19/16 1903    Clinical Impression Statement Skilled session focused on NMR for postural control, increased forward weight shift, forward and lateral reaching esp to the R to emphasize L trunk elongation.   Rehab Potential Fair   Clinical Impairments Affecting Rehab Potential Chronicity of condition, continued medical management, but has very strong family and financial support   PT Frequency 2x / week   PT Duration 8 weeks   PT Treatment/Interventions ADLs/Self Care Home Management;DME Instruction;Gait training;Stair training;Functional mobility training;Therapeutic activities;Patient/family education;Cognitive remediation;Neuromuscular re-education;Therapeutic exercise;Balance training;Orthotic Fit/Training;Wheelchair mobility training;Manual techniques;Splinting;Visual/perceptual remediation/compensation;Vestibular;Passive range of motion;Electrical Stimulation   PT Next Visit Plan prone stretching, cont. transfer training, gait and balance training; activities to fascilitate Lt trunk elongation   Consulted and Agree with Plan of Care Patient;Family member/caregiver   Family Member Consulted caregiver, [name] and husband [name]      Patient will benefit from skilled therapeutic intervention in order to improve the following  deficits and impairments:  Abnormal gait, Decreased balance, Decreased endurance, Decreased cognition, Cardiopulmonary status limiting activity, Decreased activity tolerance, Decreased coordination, Decreased strength, Impaired flexibility, Impaired tone, Decreased mobility, Increased muscle spasms, Impaired vision/preception, Decreased range of motion, Impaired UE functional use, Postural dysfunction, Improper body mechanics, Impaired perceived functional ability, Decreased knowledge of precautions, Decreased knowledge of use of DME  Visit Diagnosis: Abnormal posture  Muscle weakness (generalized)  Other abnormalities of gait and mobility     Problem List Patient Active Problem List   Diagnosis Date Noted  . Heterotopic ossification of bone 03/10/2016  . Acute on chronic respiratory failure with hypoxia ([place])   . Acute hypoxemic respiratory failure ([place])   . Acute blood loss anemia   . Atelectasis 02/04/2016  . Debility 02/03/2016  . Pneumonia of both lower lobes due to Pseudomonas species ([place])   . Pneumonia of both lower lobes due to methicillin susceptible Staphylococcus aureus (MSSA) ([place])   . Acute respiratory failure with hypoxia ([place])   . Acute encephalopathy   . Seizures ([place])   . Sepsis ([place])   . Hypoxia   . Pain   . HCAP (healthcare-associated pneumonia) 12/28/2015  . Chronic respiratory failure ([place]) 12/28/2015  . Acute tracheobronchitis 12/24/2015  . Tracheostomy tube present ([place])   . Tracheal stenosis   . Chronic respiratory failure with hypoxia ([place])   . 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[place]) 10/20/2015  . Tracheostomy status ([place]) 09/26/2015  . Allergic rhinitis 09/26/2015  . 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 Movement disorder 09/22/2015  . Encephalitis 09/22/2015  . Chest pain 02/07/2014  . Premature atrial contractions 01/13/2014  . Abnormality of gait 12/04/2013  . Hyperlipidemia 12/21/2011  . Cardiovascular risk factor 12/21/2011  . Bunion 01/31/2008  .  Metatarsalgia of both feet 09/20/2007  . FLAT FOOT 09/20/2007  . HALLUX RIGIDUS,  ACQUIRED 09/20/2007    Cameron Sprang, PT, MPT [facility] [address] [place], [place], [address] Phone: [phone]   Fax:  [phone] 04/19/16, 7:13 PM  Name: JOHNICE RIEBE MRN: [id number] Date of Birth: May 11, 1950

## 2016-04-19 NOTE — Therapy (Signed)
[facility] [address] [place], [place], [address] Phone: [phone]   Fax:  [phone]  Speech Language Pathology Treatment  Patient Details  Name: Judy Spears MRN: [id number] Date of Birth: 1950/10/21 Referring Provider: Charlett Blake, MD  Encounter Date: 04/19/2016      End of Session - 04/19/16 1737    Visit Number 15   Number of Visits 17   Date for SLP Re-Evaluation 04/30/16   SLP Start Time 73   SLP Stop Time  1445   SLP Time Calculation (min) 43 min   Activity Tolerance Patient tolerated treatment well      Past Medical History:  Diagnosis Date  . Arthritis    lt great toe  . Complication of anesthesia    pt has had headaches post op-did advise to hydra well preop  . Hair loss    right-sided  . History of exercise stress test    a. ETT (12/15) with 12:00 exercise, no ST changes, occasional PACs.   . Hyperlipidemia   . Insomnia   . Migraine headache   . 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 Postmenopausal   . Premature atrial contractions    a. Holter (12/15) with 8% PACs, no atrial runs or atrial fibrillation.     Past Surgical History:  Procedure Laterality Date  . BREAST BIOPSY  01/01/2011   Procedure: BREAST BIOPSY WITH NEEDLE LOCALIZATION;  Surgeon: Rolm Bookbinder, MD;  Location: [facility];  Service: General;  Laterality: Left;  left breast wire localization biopsy  . cataracts right eye    . CESAREAN SECTION  1986  . HERNIA REPAIR  2000   RIH  . opirectinal membrane peel    . RHINOPLASTY  1983    There were no vitals filed for this visit.      Subjective Assessment - 04/19/16 1445    Subjective "Judy Spears" audible, clear               ADULT SLP TREATMENT - 04/19/16 1718      General Information   Behavior/Cognition Alert;Cooperative;Pleasant mood;Distractible;Requires cueing   Patient Positioning Upright in chair   Oral care  provided Yes     Treatment Provided   Treatment provided Dysphagia;Cognitive-Linquistic     Dysphagia Treatment   Temperature Spikes Noted No   Respiratory Status Room air   Oral Cavity - Dentition Adequate natural dentition   Treatment Methods Skilled observation;Therapeutic exercise;Compensation strategy training;Upgraded PO texture trial;Patient/caregiver education   Patient observed directly with PO's Yes   Type of PO's observed Thin liquids;Dysphagia 1 (puree);Regular   Feeding Total assist   Liquids provided via Cup   Oral Phase Signs & Symptoms Anterior loss/spillage;Prolonged bolus formation;Other (comment)  diffuse solid residue   Pharyngeal Phase Signs & Symptoms Immediate cough   Type of cueing Verbal;Tactile;Visual   Amount of cueing Moderate   Other treatment/comments SLP performed oral care to maximize safety for PO trials. Moderate, clear oral secretions. Thin liquid via cup sips, anterior spillage in 50% of trials, pt requires mod verbal cues for lip closure, effortful swallow for secretion management between boluses. No overt signs of aspiration noted with thin liquids or pureed solids; swallow appears timely with adequate airway protection. Pt's husband states they have tried soft solids (cut up chicken) as pt requesting to chew, but are primarily sticking to uniform pureed textures. Upgraded texture trial of regular solid cracker. Pt with prolonged mastication,  bolus formation, moderate diffuse residue requiring multiple cued lingual sweeps, dry swallows to clear residue. With liquid wash immediately followed, pt with cough x1. No other overt signs of aspiration noted.      Pain Assessment   Pain Assessment No/denies pain     Cognitive-Linquistic Treatment   Treatment focused on Dysarthria;Voice;Patient/family/caregiver education   Skilled Treatment Targeted speech intelligibility, breath support and pacing, and overarticulation during phrase and short sentence level  conversational responses. Min-moderate cues overall throughout, though with single verbal cue to swallow prior to speaking, pt implements good breath pacing, amplitude and overarticulation with rare min A.     Tracheostomy Shiley 4 mm Uncuffed   Tracheostomy Properties Placement Date: 02/24/16 Placement Time: 0927 Placed By: ICU physician Brand: Shiley Size (mm): 4 mm Style: Uncuffed   Status Capped;Secured   Site Assessment Clean;Dry   Ties Assessment Clean;Dry;Secure     Assessment / Recommendations / Plan   Plan Continue with current plan of care     Dysphagia Recommendations   Diet recommendations Dysphagia 1 (puree);Thin liquid   Liquids provided via Cup;Teaspoon   Medication Administration Via alternative means   Supervision Full supervision/cueing for compensatory strategies;Trained caregiver to feed patient   Compensations Effortful swallow;Multiple dry swallows after each bite/sip;Check for anterior loss;Small sips/bites;Slow rate   Postural Changes and/or Swallow Maneuvers Seated upright 90 degrees     Progression Toward Goals   Progression toward goals Progressing toward goals          SLP Education - 04/19/16 1737    Education provided Yes   Education Details overt s/sx of aspiration, diet recommendation, secretion management   Person(s) Educated Patient;Spouse;Caregiver(s)   Methods Explanation;Demonstration;Verbal cues   Comprehension Verbalized understanding;Returned demonstration          SLP Short Term Goals - 04/19/16 1741      SLP SHORT TERM GOAL #1   Title Patient will utilize adequate breath support, slow rate and visual cues from communication partner/AAC to communicate wants, needs, thoughts and ideas.   Baseline 03/15/16, 04/05/16, 04/12/16, 04/19/16   Time 1   Period Weeks   Status On-going     SLP SHORT TERM GOAL #2   Title Patient will utilize intelligibility strategies, breath support with mod A for phrase repetition in 75% of trials.   Baseline  2.12.18, 04/05/16, 04/08/16, 04/12/16   Time 1   Period Weeks   Status Achieved     SLP SHORT TERM GOAL #3   Title Pt will swallow thin liquids, purees and soft solids in 9/10 trials with no overt signs of aspiration.   Baseline 2.12.18, 03-12-16, 03-15-16   Time 4   Period Weeks   Status Achieved     SLP SHORT TERM GOAL #4   Title Patient will tolerate 6 oz pleasure feeds of pureed solids and thin liquids with no overt signs of aspiration.   Baseline Husband reports pt ate "a whole bowl of soup" with no overt s/sx aspiration   Time 1   Period Weeks   Status Partially Met          SLP Long Term Goals - 04/19/16 1742      SLP LONG TERM GOAL #1   Title Patient will utilize multimodal communication/AAC to clarify wants, needs, thoughts or ideas during communication breakdowns with max A   Time 2   Period Weeks   Status Deferred     SLP LONG TERM GOAL #2   Title Patient will utilize intelligibility strategies, breath  support with min A for 5 word functional phrases with 80% accuracy.   Time 1   Period Weeks   Status On-going     SLP LONG TERM GOAL #3   Title Patient will demonstrate readiness for MBS to determine safety for pleasure feeds by timely initiation of swallow in 3/5 trials with ice chips/H20.   Baseline 2.12.18   Time 2   Period Weeks   Status Achieved     SLP LONG TERM GOAL #4   Title Patient will tolerate pleasure feeds of 8 oz thin liquids, purees or soft solids.   Baseline 04/19/16: chip reports whole bowl of pureed soup   Time 1   Period Weeks   Status On-going          Plan - 04/19/16 1739    Clinical Impression Statement Patient remained alert and attentive for the entire session. Continues to present with need for external cuing for management of saliva, though she is observed to independently swallow to clear saliva on several occasions. Recommended pt continue with liquids, purees only at this time, ice if requesting chewing sensation.  Monitor for  overt signs of aspiration and cease feeding if observed. Continue current methods of bolus delivery at home (cup, spoon). Recommend skilled ST in order to maximize functional communication, swallowing safety, reduce caregiver burden and improve quality of life.   Speech Therapy Frequency 2x / week   Duration Other (comment)   Treatment/Interventions Aspiration precaution training;Trials of upgraded texture/liquids;Compensatory strategies;Functional tasks;Patient/family education;Cueing hierarchy;Diet toleration management by SLP;Cognitive reorganization;Compensatory techniques;SLP instruction and feedback;Internal/external aids;Multimodal communcation approach   Potential to Achieve Goals Good   Potential Considerations Ability to learn/carryover information;Co-morbidities;Severity of impairments   SLP Home Exercise Plan comfort feeds purees/liquids only with oral care prior to and after feeding, functional and phrase level tasks for breath pacing   Consulted and Agree with Plan of Care Family member/caregiver;Patient   Family Member Consulted husband chip, caregiver lisa      Patient will benefit from skilled therapeutic intervention in order to improve the following deficits and impairments:   Cognitive communication deficit  Dysarthria and anarthria  Dysphagia, oropharyngeal phase    Problem List Patient Active Problem List   Diagnosis Date Noted  . Heterotopic ossification of bone 03/10/2016  . Acute on chronic respiratory failure with hypoxia ([place])   . Acute hypoxemic respiratory failure ([place])   . Acute blood loss anemia   . Atelectasis 02/04/2016  . Debility 02/03/2016  . Pneumonia of both lower lobes due to Pseudomonas species ([place])   . Pneumonia of both lower lobes due to methicillin susceptible Staphylococcus aureus (MSSA) ([place])   . Acute respiratory failure with hypoxia ([place])   . Acute encephalopathy   . Seizures ([place])   . Sepsis ([place])   . Hypoxia   . Pain   . HCAP  (healthcare-associated pneumonia) 12/28/2015  . Chronic respiratory failure ([place]) 12/28/2015  . Acute tracheobronchitis 12/24/2015  . Tracheostomy tube present ([place])   . Tracheal stenosis   . Chronic respiratory failure with hypoxia ([place])   . 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[place]) 10/20/2015  . Tracheostomy status ([place]) 09/26/2015  . Allergic rhinitis 09/26/2015  . 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 Movement disorder 09/22/2015  . Encephalitis 09/22/2015  . Chest pain 02/07/2014  . Premature atrial contractions 01/13/2014  . Abnormality of gait 12/04/2013  . Hyperlipidemia 12/21/2011  . Cardiovascular risk factor 12/21/2011  . Bunion 01/31/2008  . Metatarsalgia of both feet 09/20/2007  . FLAT FOOT 09/20/2007  .  HALLUX RIGIDUS, ACQUIRED 09/20/2007   Deneise Lever, [place] CF-SLP Speech-Language Pathologist  Aliene Altes 04/19/2016, 5:44 PM  [facility] [address] [place], [place], [address] Phone: [phone]   Fax:  [phone]   Name: Judy Spears MRN: [id number] Date of Birth: October 09, 1950

## 2016-04-19 NOTE — Therapy (Signed)
[facility] [address] [place], [place], [address] Phone: [phone]   Fax:  [phone]  Occupational Therapy Treatment  Patient Details  Name: Judy Spears MRN: [id number] Date of Birth: [birth year] Referring Provider: Dr. Quita Skye. [name]  Encounter Date: 04/19/2016      OT End of Session - 04/19/16 1544    Visit Number 14   Number of Visits 16   Date for OT Re-Evaluation 04/26/16   Authorization Type BCBS unlimited visits   OT Start Time 6222   OT Stop Time 1529   OT Time Calculation (min) 42 min   Activity Tolerance Patient tolerated treatment well      Past Medical History:  Diagnosis Date  . Arthritis    lt great toe  . Complication of anesthesia    pt has had headaches post op-did advise to hydra well preop  . Hair loss    right-sided  . History of exercise stress test    a. ETT (12/15) with 12:00 exercise, no ST changes, occasional PACs.   . Hyperlipidemia   . Insomnia   . Migraine headache   . 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 Postmenopausal   . Premature atrial contractions    a. Holter (12/15) with 8% PACs, no atrial runs or atrial fibrillation.     Past Surgical History:  Procedure Laterality Date  . BREAST BIOPSY  01/01/2011   Procedure: BREAST BIOPSY WITH NEEDLE LOCALIZATION;  Surgeon: Rolm Bookbinder, MD;  Location: [facility];  Service: General;  Laterality: Left;  left breast wire localization biopsy  . cataracts right eye    . CESAREAN SECTION  1986  . HERNIA REPAIR  2000   RIH  . opirectinal membrane peel    . RHINOPLASTY  1983    There were no vitals filed for this visit.      Subjective Assessment - 04/19/16 1452    Patient is accompained by: Family member  caregiver [name], husband [name]   Patient Stated Goals Pt unable to state.    Currently in Pain? No/denies                      OT Treatments/Exercises (OP) - 04/19/16  0001      Neurological Re-education Exercises   Other Exercises 1 Neuro re ed to focus on more normal movement patterns for grasp, release and reach with BUE's through the context of a functional activity (donning oversized shirt).  Pt needs hand over hand, cues to look at hands and activity, tactile cues for directionality, grading and follow through.  Pt demonstrates perservative utilzation behavior with B hands when attemtping functional task - pt benefits from backward chaining and activity interruption to decrease ineffective repetitive movement and improve purposeful, directed use of UE's with more normalized movement patterns. Pt with improvement with task today.  Also demonstrated to caregiver [name] and caregiver able to return demonstrate after observation and practice.  Caregiver to work on this at home.                 OT Education - 04/19/16 1541    Education provided Yes   Education Details donning/doffing shirt to encourrage more normalized movement patterns for BUE's   Person(s) Educated Patient;Caregiver(s)   Methods Explanation;Demonstration;Tactile cues;Verbal cues   Comprehension Verbalized understanding;Returned demonstration          OT Short Term Goals - 04/19/16 1542  OT SHORT TERM GOAL #1   Title Pt, husband and cargiver will be mod I with HEP - 03/29/2016   Status Achieved     OT SHORT TERM GOAL #2   Title Pt will be able to sit with supervision without UE support and vc's to assist with midline 75% while engage in functional task   Status Achieved     OT SHORT TERM GOAL #3   Title Pt will be able to complete sit to stand consistently with mod A for toilet transfers (max a for pivot)   Status Achieved     OT SHORT TERM GOAL #4   Title Pt, husband and caregiver will verbalize understanding of various bathroom accomodations to reduce long term caregiver burden   Status Achieved           OT Long Term Goals - 04/19/16 1542      OT [place] #1   Title Pt, husband and caregiver will be mod I with upgraded HEP prn - 04/26/2016   Status On-going     OT LONG TERM GOAL #2   Title Pt, husband and caregiver will be mod I with splint wear and care    Status Achieved     OT LONG TERM GOAL #3   Title Pt will be able to wash face with min a only   Status On-going     OT LONG TERM GOAL #4   Title Pt will be able to sit without UE support with supevision 100% of the time with no more than 2 vc's while engaged in functional activity.   Status Achieved     OT LONG TERM GOAL #5   Title Pt, husband and caregiver will verbalize understanding of potential after care therapeutic opportunities.    Status On-going               Plan - 04/19/16 1542    Clinical Impression Statement Pt with slow but steady progress toward goals.  Pt with slowly improving UE functional use as postural control improves as well as facilitation to decrease abnormal posturing/movement patterns in UE's.    Rehab Potential Fair   Clinical Impairments Affecting Rehab Potential severity of deficits, very slow rate of recovery   OT Frequency 2x / week   OT Duration 8 weeks   OT Treatment/Interventions Self-care/ADL training;Ultrasound;Moist Heat;Electrical Stimulation;DME and/or AE instruction;Neuromuscular education;Therapeutic exercise;Functional Mobility Training;Manual Therapy;Passive range of motion;Splinting;Therapeutic activities;Balance training;Patient/family education;Visual/perceptual remediation/compensation;Cognitive remediation/compensation   Plan functional use of UE's, NMR for UE, trunk, LE's, static to dynamic standing balance, finding midline, functional mobility   Consulted and Agree with Plan of Care Patient;Family member/caregiver   Family Member Consulted caregiver [name], husband [name]       Patient will benefit from skilled therapeutic intervention in order to improve the following deficits and impairments:  Decreased endurance,  Decreased skin integrity, Impaired vision/preception, Improper body mechanics, Impaired perceived functional ability, Decreased knowledge of precautions, Cardiopulmonary status limiting activity, Decreased activity tolerance, Decreased knowledge of use of DME, Decreased strength, Impaired flexibility, Improper spinal/pelvic alignment, Impaired sensation, Difficulty walking, Decreased mobility, Decreased balance, Decreased cognition, Decreased range of motion, Impaired tone, Impaired UE functional use, Decreased safety awareness, Decreased coordination, Pain  Visit Diagnosis: Unsteadiness on feet  Abnormal posture  Muscle weakness (generalized)  Other symptoms and signs involving the nervous system  Stiffness of right shoulder, not elsewhere classified  Stiffness of left shoulder, not elsewhere classified  Stiffness of left elbow, not elsewhere classified  Frontal  lobe and executive function deficit    Problem List Patient Active Problem List   Diagnosis Date Noted  . Heterotopic ossification of bone 03/10/2016  . Acute on chronic respiratory failure with hypoxia ([place])   . Acute hypoxemic respiratory failure ([place])   . Acute blood loss anemia   . Atelectasis 02/04/2016  . Debility 02/03/2016  . Pneumonia of both lower lobes due to Pseudomonas species ([place])   . Pneumonia of both lower lobes due to methicillin susceptible Staphylococcus aureus (MSSA) ([place])   . Acute respiratory failure with hypoxia ([place])   . Acute encephalopathy   . Seizures ([place])   . Sepsis ([place])   . Hypoxia   . Pain   . HCAP (healthcare-associated pneumonia) 12/28/2015  . Chronic respiratory failure ([place]) 12/28/2015  . Acute tracheobronchitis 12/24/2015  . Tracheostomy tube present ([place])   . Tracheal stenosis   . Chronic respiratory failure with hypoxia ([place])   . 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[place]) 10/20/2015  . Tracheostomy status ([place]) 09/26/2015  . Allergic rhinitis 09/26/2015  . Viral encephalitis 09/22/2015   . Movement disorder 09/22/2015  . Encephalitis 09/22/2015  . Chest pain 02/07/2014  . Premature atrial contractions 01/13/2014  . Abnormality of gait 12/04/2013  . Hyperlipidemia 12/21/2011  . Cardiovascular risk factor 12/21/2011  . Bunion 01/31/2008  . Metatarsalgia of both feet 09/20/2007  . FLAT FOOT 09/20/2007  . HALLUX RIGIDUS, ACQUIRED 09/20/2007    Quay Burow, OTR/L 04/19/2016, 3:45 PM  [facility] [address] [place], [place], [address] Phone: [phone]   Fax:  [phone]  Name: GALE KLAR MRN: [id number] Date of Birth: 1950-03-28

## 2016-04-22 ENCOUNTER — Ambulatory Visit: Payer: BC Managed Care – PPO | Admitting: Occupational Therapy

## 2016-04-22 ENCOUNTER — Ambulatory Visit: Payer: BC Managed Care – PPO | Admitting: Speech Pathology

## 2016-04-22 ENCOUNTER — Encounter: Payer: Self-pay | Admitting: Occupational Therapy

## 2016-04-22 ENCOUNTER — Ambulatory Visit: Payer: BC Managed Care – PPO | Admitting: Rehabilitation

## 2016-04-22 ENCOUNTER — Encounter: Payer: Self-pay | Admitting: Rehabilitation

## 2016-04-22 DIAGNOSIS — R293 Abnormal posture: Secondary | ICD-10-CM

## 2016-04-22 DIAGNOSIS — M25622 Stiffness of left elbow, not elsewhere classified: Secondary | ICD-10-CM

## 2016-04-22 DIAGNOSIS — M6281 Muscle weakness (generalized): Secondary | ICD-10-CM

## 2016-04-22 DIAGNOSIS — R1312 Dysphagia, oropharyngeal phase: Secondary | ICD-10-CM

## 2016-04-22 DIAGNOSIS — R2681 Unsteadiness on feet: Secondary | ICD-10-CM | POA: Diagnosis not present

## 2016-04-22 DIAGNOSIS — M25611 Stiffness of right shoulder, not elsewhere classified: Secondary | ICD-10-CM

## 2016-04-22 DIAGNOSIS — R2689 Other abnormalities of gait and mobility: Secondary | ICD-10-CM

## 2016-04-22 DIAGNOSIS — R41844 Frontal lobe and executive function deficit: Secondary | ICD-10-CM

## 2016-04-22 DIAGNOSIS — M25612 Stiffness of left shoulder, not elsewhere classified: Secondary | ICD-10-CM

## 2016-04-22 DIAGNOSIS — R29818 Other symptoms and signs involving the nervous system: Secondary | ICD-10-CM

## 2016-04-22 DIAGNOSIS — R471 Dysarthria and anarthria: Secondary | ICD-10-CM

## 2016-04-22 DIAGNOSIS — R41841 Cognitive communication deficit: Secondary | ICD-10-CM

## 2016-04-22 NOTE — Therapy (Signed)
[facility] [address] [place], [place], [address] Phone: [phone]   Fax:  [phone]  Physical Therapy Treatment  Patient Details  Name: Judy Spears MRN: [id number] Date of Birth: [birth year] Referring Provider: Alger Simons, MD/Andrew Letta Pate, MD  Encounter Date: 04/22/2016      PT End of Session - 04/22/16 1906    Visit Number 16   Number of Visits 20   Date for PT Re-Evaluation 04/30/16   Authorization Type BCBS   PT Start Time 1532   PT Stop Time 1618   PT Time Calculation (min) 46 min   Activity Tolerance Patient tolerated treatment well   Behavior During Therapy [facility] for tasks assessed/performed      Past Medical History:  Diagnosis Date  . Arthritis    lt great toe  . Complication of anesthesia    pt has had headaches post op-did advise to hydra well preop  . Hair loss    right-sided  . History of exercise stress test    a. ETT (12/15) with 12:00 exercise, no ST changes, occasional PACs.   . Hyperlipidemia   . Insomnia   . Migraine headache   . 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 Postmenopausal   . Premature atrial contractions    a. Holter (12/15) with 8% PACs, no atrial runs or atrial fibrillation.     Past Surgical History:  Procedure Laterality Date  . BREAST BIOPSY  01/01/2011   Procedure: BREAST BIOPSY WITH NEEDLE LOCALIZATION;  Surgeon: Rolm Bookbinder, MD;  Location: [facility];  Service: General;  Laterality: Left;  left breast wire localization biopsy  . cataracts right eye    . CESAREAN SECTION  1986  . HERNIA REPAIR  2000   RIH  . opirectinal membrane peel    . RHINOPLASTY  1983    There were no vitals filed for this visit.      Subjective Assessment - 04/22/16 1905    Subjective Pts family present with pt and husband today to observe therapy.  No changes to report.    Patient is accompained by: Family member   Pertinent History  Precautions:  PEG.  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  Another round of botox in LLE on 02/06/16   Limitations House hold activities;Walking;Standing;Sitting   Patient Stated Goals Per husband, "For [name] to be as independent as possible."   Currently in Pain? No/denies                NMR:  Attempted to have pt get into prone position prior to NMR, however noted that once pt in prone, she began to regurgitate and therefore was assisted into sitting.  She did have episode of emesis, however was better once in upright position.  Also note that during transitional movements with pt significant nystagmus that seemed to be both peripheral and central in nature, however did not further assess due to nausea/vomiting.  Then moved into tall kneeling position to continue to work on improved postural control, BLE forced WB, weight shift and improved trunk extension.  Note that in this position, she continued to demonstrate what felt to be active pulling into flexed position, despite trying to assist less.  Then transitioned into sitting position on large rocker board to work on trunk  shortening/lengthening and pelvic dissociation.  PT assisted with UEs to increase amount of trunk lengthening.  Note following several reps, pt able to return demonstration very well and esp showed good elongation on the L side.  Ended session with gait x 48' with +2A with PT in front for BUE support and to encourage upright posture and PT behind Judy Spears, PT-who was present to assist with all activities as needed for safety) in order to assist at hips for improved weight shift and forward protraction.  Would stop gait intermittently to allow pt to re-set posture as with increased gait, she would get forward flexed posture.  She did  show marked improvement in ability to correct with increased trunk extension today, however did note increased R LOB today due to decrease RLE activation.                     PT Short Term Goals - 03/30/16 2105      PT SHORT TERM GOAL #1   Title Pt will consistently perform supine rolling in B directions with min A (with rails as needed) in order to indicate increased trunk dissociation, increased independence with bed mobility.  (Target date for all STGs: 03/29/16)   Baseline rolled to R with CGA to min assist on 03-30-16; min assist from supine to Lt sidelying   Status Achieved     PT SHORT TERM GOAL #2   Title Pt will consistently perform supine <> sit with min A to indicate increased independence getting out of bed.    Baseline min to mod assist needed on 03-30-16   Status On-going     PT SHORT TERM GOAL #3   Title Pt will demonstrate midline posture in static sitting with BLE support and RUE support at a S level to indicate increased postural awareness/control.     Baseline met 03-30-16   Status Achieved     PT SHORT TERM GOAL #4   Title Pt will consistently perform functional transfers from w/c <> mat table with mod A to decrease caregiver burden.     Baseline mod assist with stand pivot transfers - 03-30-16   Status Achieved     PT SHORT TERM GOAL #5   Title Pt will consistently perform sit <> stand from personal w/c with BUE support and mod A to decrease caregiver burden.     Baseline met 03-30-16   Status Achieved     PT SHORT TERM GOAL #6   Title Pt will perform static standing with BUE support x1 minute with supervision while in stedy, no overt LOB to decrease caregiver burden.    Baseline pt able to stand unsupported with mod to min assist - not using Stedy in PT  (03-30-16)   Status Achieved           PT Long Term Goals - 03/01/16 1641      PT LONG TERM GOAL #1   Title Pt will perform supine <> sit with supervision 75% of the time to indicate increased  independence getting out of bed.   (Target for all LTGs: 04/26/16)   Time 8   Period Weeks   Status New     PT LONG TERM GOAL #2   Title Pt will demonstrate midline posture in static sitting with BLE support without UE support to indicate increased postural awareness/control.    Time 8   Period Weeks   Status New     PT LONG TERM GOAL #3  Title Pt will consistently perform functional transfers at min A level 75% of the time to decrease caregiver burden.     Time 8   Period Weeks   Status New     PT LONG TERM GOAL #4   Title Pt will perform sit <> stand from personal w/c with BUE support and min A, 75% of time to decrease caregiver burden.     Time 8   Period Weeks   Status New     PT LONG TERM GOAL #5   Title Pt will demonstrate ability to ambulate up to 5' w/ LRAD with max A in order to better assist with getting to/from restroom to decrease burden of care.    Time 8   Period Weeks   Status New               Plan - 04/22/16 1907    Clinical Impression Statement Pt able to demonstrate improved ability to correct upright posture with trunk extension today.  Also better demonstrates improved ability to self correct head and shoulder posturing.  Pt making steady progress towards goals.    Rehab Potential Fair   Clinical Impairments Affecting Rehab Potential Chronicity of condition, continued medical management, but has very strong family and financial support   PT Frequency 2x / week   PT Duration 8 weeks   PT Treatment/Interventions ADLs/Self Care Home Management;DME Instruction;Gait training;Stair training;Functional mobility training;Therapeutic activities;Patient/family education;Cognitive remediation;Neuromuscular re-education;Therapeutic exercise;Balance training;Orthotic Fit/Training;Wheelchair mobility training;Manual techniques;Splinting;Visual/perceptual remediation/compensation;Vestibular;Passive range of motion;Electrical Stimulation   PT Next Visit Plan prone  stretching, cont. transfer training, gait and balance training; activities to fascilitate Lt trunk elongation   Consulted and Agree with Plan of Care Patient;Family member/caregiver   Family Member Consulted caregiver, Judy Spears and husband Judy Spears      Patient will benefit from skilled therapeutic intervention in order to improve the following deficits and impairments:  Abnormal gait, Decreased balance, Decreased endurance, Decreased cognition, Cardiopulmonary status limiting activity, Decreased activity tolerance, Decreased coordination, Decreased strength, Impaired flexibility, Impaired tone, Decreased mobility, Increased muscle spasms, Impaired vision/preception, Decreased range of motion, Impaired UE functional use, Postural dysfunction, Improper body mechanics, Impaired perceived functional ability, Decreased knowledge of precautions, Decreased knowledge of use of DME  Visit Diagnosis: Unsteadiness on feet  Abnormal posture  Muscle weakness (generalized)  Other abnormalities of gait and mobility     Problem List Patient Active Problem List   Diagnosis Date Noted  . Heterotopic ossification of bone 03/10/2016  . Acute on chronic respiratory failure with hypoxia ([place])   . Acute hypoxemic respiratory failure ([place])   . Acute blood loss anemia   . Atelectasis 02/04/2016  . Debility 02/03/2016  . Pneumonia of both lower lobes due to Pseudomonas species ([place])   . Pneumonia of both lower lobes due to methicillin susceptible Staphylococcus aureus (MSSA) ([place])   . Acute respiratory failure with hypoxia ([place])   . Acute encephalopathy   . Seizures ([place])   . Sepsis ([place])   . Hypoxia   . Pain   . HCAP (healthcare-associated pneumonia) 12/28/2015  . Chronic respiratory failure ([place]) 12/28/2015  . Acute tracheobronchitis 12/24/2015  . Tracheostomy tube present ([place])   . Tracheal stenosis   . Chronic respiratory failure with hypoxia ([place])   . 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[place]) 10/20/2015  .  Tracheostomy status ([place]) 09/26/2015  . Allergic rhinitis 09/26/2015  . 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 Movement disorder 09/22/2015  . Encephalitis 09/22/2015  . Chest pain 02/07/2014  . Premature atrial contractions  01/13/2014  . Abnormality of gait 12/04/2013  . Hyperlipidemia 12/21/2011  . Cardiovascular risk factor 12/21/2011  . Bunion 01/31/2008  . Metatarsalgia of both feet 09/20/2007  . FLAT FOOT 09/20/2007  . HALLUX RIGIDUS, ACQUIRED 09/20/2007    Cameron Sprang, PT, MPT [facility] [address] [place], [place], [address] Phone: [phone]   Fax:  [phone] 04/22/16, 7:19 PM  Name: Judy Spears MRN: [id number] Date of Birth: 03-Jul-1950

## 2016-04-22 NOTE — Therapy (Signed)
[facility] [address] [place], [place], [address] Phone: [phone]   Fax:  [phone]  Speech Language Pathology Treatment  Patient Details  Name: Judy Spears MRN: [id number] Date of Birth: 12-21-1950 Referring Provider: Charlett Blake, MD  Encounter Date: 04/22/2016      End of Session - 04/22/16 1805    Visit Number 16   Number of Visits 17   Date for SLP Re-Evaluation 04/30/16   SLP Start Time 2878   SLP Stop Time  1529   SLP Time Calculation (min) 42 min   Activity Tolerance Patient tolerated treatment well;Other (comment)  pt limited by reduced attention      Past Medical History:  Diagnosis Date  . Arthritis    lt great toe  . Complication of anesthesia    pt has had headaches post op-did advise to hydra well preop  . Hair loss    right-sided  . History of exercise stress test    a. ETT (12/15) with 12:00 exercise, no ST changes, occasional PACs.   . Hyperlipidemia   . Insomnia   . Migraine headache   . 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 Postmenopausal   . Premature atrial contractions    a. Holter (12/15) with 8% PACs, no atrial runs or atrial fibrillation.     Past Surgical History:  Procedure Laterality Date  . BREAST BIOPSY  01/01/2011   Procedure: BREAST BIOPSY WITH NEEDLE LOCALIZATION;  Surgeon: Rolm Bookbinder, MD;  Location: [facility];  Service: General;  Laterality: Left;  left breast wire localization biopsy  . cataracts right eye    . CESAREAN SECTION  1986  . HERNIA REPAIR  2000   RIH  . opirectinal membrane peel    . RHINOPLASTY  1983    There were no vitals filed for this visit.      Subjective Assessment - 04/22/16 1527    Subjective Inaudible greeting to SLP. Brother, sister-in-law present for session   Patient is accompained by: Family member   Currently in Pain? No/denies               ADULT SLP TREATMENT - 04/22/16  1445      General Information   Behavior/Cognition Alert;Cooperative;Pleasant mood;Distractible;Requires cueing   Patient Positioning Upright in chair   Oral care provided Yes     Treatment Provided   Treatment provided Dysphagia;Cognitive-Linquistic     Dysphagia Treatment   Temperature Spikes Noted No   Respiratory Status Room air   Oral Cavity - Dentition Adequate natural dentition   Treatment Methods Skilled observation;Therapeutic exercise;Compensation strategy training;Upgraded PO texture trial;Patient/caregiver education   Patient observed directly with PO's Yes   Type of PO's observed Regular;Dysphagia 1 (puree);Thin liquids   Feeding Total assist   Liquids provided via Cup   Oral Phase Signs & Symptoms Anterior loss/spillage;Prolonged bolus formation;Oral holding   Pharyngeal Phase Signs & Symptoms Immediate cough;Wet vocal quality   Type of cueing Verbal;Tactile;Visual   Amount of cueing Maximal   Other treatment/comments SLP performed oral care prior to remove moderate amount of dried, yellow secretions from palate, lingual surface. Caregiver states pt had soup for lunch. Educated pt, caregiver and family re: oral care prior to and after all PO to reduce oral bacterial load, risks for aspiration of secretions. Initial cup sip of water, pt with immediate cough, wet vocal quality suggestive of reduced airway protection. SLP waited several moments until coughing  subsided, vocal quality improved. No further signs of aspiration observed, however pt continues to require consistent cues for second swallow for secretion management and this session moderate cues for attention. Trialed regular solid; pt required moderate cues, demonstration for lingual sweep as well as multiple swallows for oral clearance. Oral care performed following PO intake.      Pain Assessment   Pain Assessment No/denies pain     Cognitive-Linquistic Treatment   Treatment focused on  Dysarthria;Voice;Patient/family/caregiver education   Skilled Treatment Targeted speech intelligibility, breath support and pacing, and overarticulation during phrase and short sentence level conversational responses. Pt required mod-max cues throughout today's session for voicing, breath support, slowing rate.Verbal cue to swallow prior to speaking improve intelligibility.     Tracheostomy Shiley 4 mm Uncuffed   Tracheostomy Properties Placement Date: 02/24/16 Placement Time: 0927 Placed By: ICU physician Brand: Shiley Size (mm): 4 mm Style: Uncuffed   Status Capped;Secured   Site Assessment Clean;Dry   Ties Assessment Clean;Dry;Secure     Assessment / Recommendations / Plan   Plan Continue with current plan of care     Dysphagia Recommendations   Diet recommendations Dysphagia 1 (puree);Thin liquid   Liquids provided via Cup;Teaspoon   Medication Administration Via alternative means   Supervision Full supervision/cueing for compensatory strategies;Trained caregiver to feed patient   Compensations Effortful swallow;Multiple dry swallows after each bite/sip;Check for anterior loss;Small sips/bites;Slow rate   Postural Changes and/or Swallow Maneuvers Seated upright 90 degrees     Progression Toward Goals   Progression toward goals Progressing toward goals          SLP Education - 04/22/16 1805    Education provided Yes   Education Details overt s/sx of aspiration, oral care prior to and following PO   Person(s) Educated Patient;Caregiver(s)   Methods Explanation;Demonstration   Comprehension Verbalized understanding          SLP Short Term Goals - 04/22/16 1809      SLP SHORT TERM GOAL #1   Title Patient will utilize adequate breath support, slow rate and visual cues from communication partner/AAC to communicate wants, needs, thoughts and ideas.   Baseline 03/15/16, 04/05/16, 04/12/16, 04/19/16   Time 1   Period Weeks   Status On-going     SLP SHORT TERM GOAL #2   Title  Patient will utilize intelligibility strategies, breath support with mod A for phrase repetition in 75% of trials.   Baseline 2.12.18, 04/05/16, 04/08/16, 04/12/16   Time 1   Period Weeks   Status Achieved     SLP SHORT TERM GOAL #3   Title Pt will swallow thin liquids, purees and soft solids in 9/10 trials with no overt signs of aspiration.   Baseline 2.12.18, 03-12-16, 03-15-16   Time 4   Period Weeks   Status Achieved     SLP SHORT TERM GOAL #4   Title Patient will tolerate 6 oz pleasure feeds of pureed solids and thin liquids with no overt signs of aspiration.   Baseline Husband reports pt ate "a whole bowl of soup" with no overt s/sx aspiration   Time 1   Period Weeks   Status Partially Met          SLP Long Term Goals - 04/22/16 1810      SLP LONG TERM GOAL #1   Title Patient will utilize multimodal communication/AAC to clarify wants, needs, thoughts or ideas during communication breakdowns with max A   Time 2   Period Weeks  Status Deferred     SLP LONG TERM GOAL #2   Title Patient will utilize intelligibility strategies, breath support with min A for 5 word functional phrases with 80% accuracy.   Time 1   Period Weeks   Status On-going     SLP LONG TERM GOAL #3   Title Patient will demonstrate readiness for MBS to determine safety for pleasure feeds by timely initiation of swallow in 3/5 trials with ice chips/H20.   Baseline 2.12.18   Time 2   Period Weeks   Status Achieved     SLP LONG TERM GOAL #4   Title Patient will tolerate pleasure feeds of 8 oz thin liquids, purees or soft solids.   Baseline 04/19/16: chip reports whole bowl of pureed soup   Time 1   Period Weeks   Status On-going          Plan - 04/22/16 1807    Clinical Impression Statement Patient remained alert, however with fluctating attention throughout todays' session, requiring more consistent cuing than in recent sessions. Continues to present with need for external cuing for management of  saliva. Recommended pt continue with liquids, purees only at this time, ice if requesting chewing sensation.  Monitor for overt signs of aspiration and cease feeding if observed. Continue current methods of bolus delivery at home (cup, spoon). Recommend skilled ST in order to maximize functional communication, swallowing safety, reduce caregiver burden and improve quality of life.   Speech Therapy Frequency 2x / week   Duration Other (comment)   Treatment/Interventions Aspiration precaution training;Trials of upgraded texture/liquids;Compensatory strategies;Functional tasks;Patient/family education;Cueing hierarchy;Diet toleration management by SLP;Cognitive reorganization;Compensatory techniques;SLP instruction and feedback;Internal/external aids;Multimodal communcation approach   Potential to Achieve Goals Good   Potential Considerations Ability to learn/carryover information;Co-morbidities;Severity of impairments   SLP Home Exercise Plan comfort feeds purees/liquids only with oral care prior to and after feeding, functional and phrase level tasks for breath pacing   Consulted and Agree with Plan of Care Family member/caregiver;Patient      Patient will benefit from skilled therapeutic intervention in order to improve the following deficits and impairments:   Dysphagia, oropharyngeal phase  Cognitive communication deficit  Dysarthria and anarthria    Problem List Patient Active Problem List   Diagnosis Date Noted  . Heterotopic ossification of bone 03/10/2016  . Acute on chronic respiratory failure with hypoxia ([facility])   . Acute hypoxemic respiratory failure ([place])   . Acute blood loss anemia   . Atelectasis 02/04/2016  . Debility 02/03/2016  . Pneumonia of both lower lobes due to Pseudomonas species ([place])   . Pneumonia of both lower lobes due to methicillin susceptible Staphylococcus aureus (MSSA) ([place])   . Acute respiratory failure with hypoxia ([place])   . Acute encephalopathy   .  Seizures ([place])   . Sepsis ([place])   . Hypoxia   . Pain   . HCAP (healthcare-associated pneumonia) 12/28/2015  . Chronic respiratory failure ([place]) 12/28/2015  . Acute tracheobronchitis 12/24/2015  . Tracheostomy tube present ([place])   . Tracheal stenosis   . Chronic respiratory failure with hypoxia ([place])   . 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[place]) 10/20/2015  . Tracheostomy status ([facility]) 09/26/2015  . Allergic rhinitis 09/26/2015  . 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 Movement disorder 09/22/2015  . Encephalitis 09/22/2015  . Chest pain 02/07/2014  . Premature atrial contractions 01/13/2014  . Abnormality of gait 12/04/2013  . Hyperlipidemia 12/21/2011  . Cardiovascular risk factor 12/21/2011  . Bunion 01/31/2008  . Metatarsalgia of both feet  09/20/2007  . FLAT FOOT 09/20/2007  . HALLUX RIGIDUS, ACQUIRED 09/20/2007   Deneise Lever, [place] CF-SLP Speech-Language Pathologist  Aliene Altes 04/22/2016, 6:11 PM  [facility] [address] [place], [place], [address] Phone: [phone]   Fax:  [phone]   Name: ALUNA WHISTON MRN: [id number] Date of Birth: [birth year]

## 2016-04-22 NOTE — Therapy (Signed)
[facility] [address] [place], [place], [address] Phone: [phone]   Fax:  [phone]  Occupational Therapy Treatment  Patient Details  Name: Judy Spears MRN: [id number] Date of Birth: 08-Oct-1950 Referring Provider: Dr. Quita Skye. [name]  Encounter Date: 04/22/2016      OT End of Session - 04/22/16 1602    Visit Number 15   Number of Visits 16   Date for OT Re-Evaluation 04/26/16   Authorization Type [employer] unlimited visits   OT Start Time 4967   OT Stop Time 1445   OT Time Calculation (min) 42 min   Activity Tolerance Patient tolerated treatment well      Past Medical History:  Diagnosis Date  . Arthritis    lt great toe  . Complication of anesthesia    pt has had headaches post op-did advise to hydra well preop  . Hair loss    right-sided  . History of exercise stress test    a. ETT (12/15) with 12:00 exercise, no ST changes, occasional PACs.   . Hyperlipidemia   . Insomnia   . Migraine headache   . 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 Postmenopausal   . Premature atrial contractions    a. Holter (12/15) with 8% PACs, no atrial runs or atrial fibrillation.     Past Surgical History:  Procedure Laterality Date  . BREAST BIOPSY  01/01/2011   Procedure: BREAST BIOPSY WITH NEEDLE LOCALIZATION;  Surgeon: Rolm Bookbinder, MD;  Location: [facility];  Service: General;  Laterality: Left;  left breast wire localization biopsy  . cataracts right eye    . CESAREAN SECTION  1986  . HERNIA REPAIR  2000   RIH  . opirectinal membrane peel    . RHINOPLASTY  1983    There were no vitals filed for this visit.      Subjective Assessment - 04/22/16 1408    Subjective  The neuro opthamologist says she doesn't need the prisms anymore.   Patient is accompained by: Family member   Pertinent History see epic snapshot - ABI/encephalitis/hypoxia from virus; recent hospitalization due  to ARF;    Patient Stated Goals Pt unable to state.    Currently in Pain? No/denies                      OT Treatments/Exercises (OP) - 04/22/16 0001      Neurological Re-education Exercises   Other Exercises 1 Neuro re ed to address functional stand step transfers, sit to stand, static standing balance and small weigh shifts in standing.  Pt required only min a and min vc's for stand step transfer.  Pt needs cues to push with LE's vs pull with UE's for sit to stand. Emphasized postural orientation, postural alignment and head righting in standing wth cues for thoracic extension and increased weight on LLE.  Utilized mirror to assist pt in postural orientation - pt improving in her ability to determine which direction she is "falling" however limited response.  For first time today pt was able to maintain static standing with BUE's as well as to motor plan for improved alignment as finding midline.  Pt able to stand without UE support for count of 10 x2 with only min stabilization thru LE's agains therapist's legs.  Pt's husband reports that pt saw neuro opthamologist yesterday and pt no longer needs prisms.  Husband working on getting new prescription and today worked  with no glasses at all.                    OT Short Term Goals - 04/22/16 1600      OT SHORT TERM GOAL #1   Title Pt, husband and cargiver will be mod I with HEP - 03/29/2016   Status Achieved     OT SHORT TERM GOAL #2   Title Pt will be able to sit with supervision without UE support and vc's to assist with midline 75% while engage in functional task   Status Achieved     OT SHORT TERM GOAL #3   Title Pt will be able to complete sit to stand consistently with mod A for toilet transfers (max a for pivot)   Status Achieved     OT SHORT TERM GOAL #4   Title Pt, husband and caregiver will verbalize understanding of various bathroom accomodations to reduce long term caregiver burden   Status Achieved            OT Long Term Goals - 04/22/16 1600      OT LONG TERM GOAL #1   Title Pt, husband and caregiver will be mod I with upgraded HEP prn - 04/26/2016   Status On-going     OT LONG TERM GOAL #2   Title Pt, husband and caregiver will be mod I with splint wear and care    Status Achieved     OT LONG TERM GOAL #3   Title Pt will be able to wash face with min a only   Status Achieved     OT LONG TERM GOAL #4   Title Pt will be able to sit without UE support with supevision 100% of the time with no more than 2 vc's while engaged in functional activity.   Status Achieved     OT LONG TERM GOAL #5   Title Pt, husband and caregiver will verbalize understanding of potential after care therapeutic opportunities.    Status On-going               Plan - 04/22/16 1600    Clinical Impression Statement Pt continues with slow but steady progress.  Pt with improving ease to begin to use UE's more functionally as postural control improves.   Rehab Potential Fair   Clinical Impairments Affecting Rehab Potential severity of deficits, very slow rate of recovery   OT Frequency 2x / week   OT Duration 8 weeks   OT Treatment/Interventions Self-care/ADL training;Ultrasound;Moist Heat;Electrical Stimulation;DME and/or AE instruction;Neuromuscular education;Therapeutic exercise;Functional Mobility Training;Manual Therapy;Passive range of motion;Splinting;Therapeutic activities;Balance training;Patient/family education;Visual/perceptual remediation/compensation;Cognitive remediation/compensation   Plan functional use of UE's, NMR for UE, trunk, LE's static to dynamic standing balance, finding midline, functional mobility   Consulted and Agree with Plan of Care Patient;Family member/caregiver   Family Member Consulted caregiver [name], husband [name]       Patient will benefit from skilled therapeutic intervention in order to improve the following deficits and impairments:  Decreased endurance,  Decreased skin integrity, Impaired vision/preception, Improper body mechanics, Impaired perceived functional ability, Decreased knowledge of precautions, Cardiopulmonary status limiting activity, Decreased activity tolerance, Decreased knowledge of use of DME, Decreased strength, Impaired flexibility, Improper spinal/pelvic alignment, Impaired sensation, Difficulty walking, Decreased mobility, Decreased balance, Decreased cognition, Decreased range of motion, Impaired tone, Impaired UE functional use, Decreased safety awareness, Decreased coordination, Pain  Visit Diagnosis: Unsteadiness on feet  Abnormal posture  Muscle weakness (generalized)  Other symptoms and signs involving the nervous  system  Stiffness of right shoulder, not elsewhere classified  Stiffness of left shoulder, not elsewhere classified  Stiffness of left elbow, not elsewhere classified  Frontal lobe and executive function deficit    Problem List Patient Active Problem List   Diagnosis Date Noted  . Heterotopic ossification of bone 03/10/2016  . Acute on chronic respiratory failure with hypoxia ([place])   . Acute hypoxemic respiratory failure ([place])   . Acute blood loss anemia   . Atelectasis 02/04/2016  . Debility 02/03/2016  . Pneumonia of both lower lobes due to Pseudomonas species ([facility])   . Pneumonia of both lower lobes due to methicillin susceptible Staphylococcus aureus (MSSA) ([place])   . Acute respiratory failure with hypoxia ([place])   . Acute encephalopathy   . Seizures ([place])   . Sepsis ([place])   . Hypoxia   . Pain   . HCAP (healthcare-associated pneumonia) 12/28/2015  . Chronic respiratory failure ([place]) 12/28/2015  . Acute tracheobronchitis 12/24/2015  . Tracheostomy tube present ([place])   . Tracheal stenosis   . Chronic respiratory failure with hypoxia ([place])   . 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[place]) 10/20/2015  . Tracheostomy status ([place]) 09/26/2015  . Allergic rhinitis 09/26/2015  . Viral encephalitis 09/22/2015   . Movement disorder 09/22/2015  . Encephalitis 09/22/2015  . Chest pain 02/07/2014  . Premature atrial contractions 01/13/2014  . Abnormality of gait 12/04/2013  . Hyperlipidemia 12/21/2011  . Cardiovascular risk factor 12/21/2011  . Bunion 01/31/2008  . Metatarsalgia of both feet 09/20/2007  . FLAT FOOT 09/20/2007  . HALLUX RIGIDUS, ACQUIRED 09/20/2007    Quay Burow, OTR/L 04/22/2016, 4:03 PM  [facility] [address] [place], [place], [address] Phone: [phone]   Fax:  [phone]  Name: RODNESHA ELIE MRN: [id number] Date of Birth: [birth year]

## 2016-04-27 ENCOUNTER — Other Ambulatory Visit: Payer: Self-pay | Admitting: Physical Medicine & Rehabilitation

## 2016-04-29 ENCOUNTER — Encounter: Payer: Self-pay | Admitting: Occupational Therapy

## 2016-04-29 ENCOUNTER — Encounter: Payer: Self-pay | Admitting: Rehabilitation

## 2016-04-29 ENCOUNTER — Ambulatory Visit: Payer: BC Managed Care – PPO | Admitting: Speech Pathology

## 2016-04-29 ENCOUNTER — Ambulatory Visit: Payer: BC Managed Care – PPO | Attending: Physical Medicine & Rehabilitation | Admitting: Rehabilitation

## 2016-04-29 ENCOUNTER — Ambulatory Visit: Payer: BC Managed Care – PPO | Admitting: Occupational Therapy

## 2016-04-29 DIAGNOSIS — R2681 Unsteadiness on feet: Secondary | ICD-10-CM

## 2016-04-29 DIAGNOSIS — R41844 Frontal lobe and executive function deficit: Secondary | ICD-10-CM

## 2016-04-29 DIAGNOSIS — R278 Other lack of coordination: Secondary | ICD-10-CM | POA: Insufficient documentation

## 2016-04-29 DIAGNOSIS — R29818 Other symptoms and signs involving the nervous system: Secondary | ICD-10-CM

## 2016-04-29 DIAGNOSIS — R41841 Cognitive communication deficit: Secondary | ICD-10-CM | POA: Diagnosis present

## 2016-04-29 DIAGNOSIS — M25622 Stiffness of left elbow, not elsewhere classified: Secondary | ICD-10-CM | POA: Diagnosis present

## 2016-04-29 DIAGNOSIS — M6281 Muscle weakness (generalized): Secondary | ICD-10-CM | POA: Insufficient documentation

## 2016-04-29 DIAGNOSIS — M25612 Stiffness of left shoulder, not elsewhere classified: Secondary | ICD-10-CM | POA: Insufficient documentation

## 2016-04-29 DIAGNOSIS — R293 Abnormal posture: Secondary | ICD-10-CM | POA: Diagnosis present

## 2016-04-29 DIAGNOSIS — M25611 Stiffness of right shoulder, not elsewhere classified: Secondary | ICD-10-CM

## 2016-04-29 DIAGNOSIS — R471 Dysarthria and anarthria: Secondary | ICD-10-CM

## 2016-04-29 DIAGNOSIS — R1312 Dysphagia, oropharyngeal phase: Secondary | ICD-10-CM | POA: Insufficient documentation

## 2016-04-29 DIAGNOSIS — R2689 Other abnormalities of gait and mobility: Secondary | ICD-10-CM | POA: Insufficient documentation

## 2016-04-29 NOTE — Therapy (Signed)
[facility] [address] [place], [place], [address] Phone: [phone]   Fax:  [phone]  Occupational Therapy Treatment  Patient Details  Name: Judy Spears MRN: [id number] Date of Birth: [birth year] Referring Provider: Dr. Quita Skye. [name]  Encounter Date: 04/29/2016      OT End of Session - 04/29/16 1652    Visit Number 16   Number of Visits 32   Date for OT Re-Evaluation 04/26/16   Authorization Type [employer] unlimited visits   OT Start Time 1402   OT Stop Time 1444   OT Time Calculation (min) 42 min   Activity Tolerance Patient tolerated treatment well      Past Medical History:  Diagnosis Date  . Arthritis    lt great toe  . Complication of anesthesia    pt has had headaches post op-did advise to hydra well preop  . Hair loss    right-sided  . History of exercise stress test    a. ETT (12/15) with 12:00 exercise, no ST changes, occasional PACs.   . Hyperlipidemia   . Insomnia   . Migraine headache   . 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 Postmenopausal   . Premature atrial contractions    a. Holter (12/15) with 8% PACs, no atrial runs or atrial fibrillation.     Past Surgical History:  Procedure Laterality Date  . BREAST BIOPSY  01/01/2011   Procedure: BREAST BIOPSY WITH NEEDLE LOCALIZATION;  Surgeon: Rolm Bookbinder, MD;  Location: [facility];  Service: General;  Laterality: Left;  left breast wire localization biopsy  . cataracts right eye    . CESAREAN SECTION  1986  . HERNIA REPAIR  2000   RIH  . opirectinal membrane peel    . RHINOPLASTY  1983    There were no vitals filed for this visit.      Subjective Assessment - 04/29/16 1628    Subjective  I like to stand   Patient is accompained by: Family member  hsuband and caregiver Leesa   Pertinent History see epic snapshot - ABI/encephalitis/hypoxia from virus; recent hospitalization due to ARF;    Patient  Stated Goals Pt unable to state.    Currently in Pain? No/denies                      OT Treatments/Exercises (OP) - 04/29/16 0001      ADLs   Functional Mobility Addressed toilet transfers using grab bar for stand step transfer. Pt required mod a and max vc's for transfer to and from toilet.  Discussed with family that while pt cannot install grab bar at home she would benefit from practicing this for when they are in the community. Pt and family verbalized understanding.  Pt will benefit from significant rote repetition.      Neurological Re-education Exercises   Other Exercises 1 Neuro re ed to address sit to stand, static standing balance, findiing midline, alignment, and functional ambulation.  Pt with improving carry over for mobility and slowly improving in postural control.                    OT Short Term Goals - 04/29/16 1633      OT SHORT TERM GOAL #1   Title Pt, husband and cargiver will be mod I with HEP - 03/29/2016   Status Achieved     OT SHORT TERM GOAL #2   Title  Pt will be able to sit with supervision without UE support and vc's to assist with midline 75% while engage in functional task   Status Achieved     OT SHORT TERM GOAL #3   Title Pt will be able to complete sit to stand consistently with mod A for toilet transfers (max a for pivot)   Status Achieved     OT SHORT TERM GOAL #4   Title Pt, husband and caregiver will verbalize understanding of various bathroom accomodations to reduce long term caregiver burden   Status Achieved     OT SHORT TERM GOAL #5   Title Pt will be able to stand step transfer to toilet with mo more than mod a, mod vc's consistently using grab bar for community toileting needs- 05/27/2016   Status New     OT SHORT TERM GOAL #6   Title Pt will be able to wash face with supervision/max cues 50% of the time, min a 50% of the time.    Status New     OT SHORT TERM GOAL #7   Title Pt will be able to don pull over  shirt with mod a, max cues   Status New     OT SHORT TERM GOAL #8   Title Pt will be able to maintain static standing with UE support with close supervision to reduce burden of care for caregivers    Status New           OT Long Term Goals - 04/29/16 1633      OT LONG TERM GOAL #1   Title Pt, husband and caregiver will be mod I with upgraded HEP prn - 04/26/2016   Status Achieved     OT LONG TERM GOAL #2   Title Pt, husband and caregiver will be mod I with splint wear and care    Status Achieved     OT LONG TERM GOAL #3   Title Pt will be able to wash face with min a only   Status Achieved     OT LONG TERM GOAL #4   Title Pt will be able to sit without UE support with supevision 100% of the time with no more than 2 vc's while engaged in functional activity.   Status Achieved     OT LONG TERM GOAL #5   Title Pt, husband and caregiver will verbalize understanding of potential after care therapeutic opportunities. - 06/24/2016   Status On-going     OT LONG TERM GOAL #6   Title Pt will be supervision to wash face consistently   Status New     OT LONG TERM GOAL #7   Title Pt will don shirt with min a and max vc's 50% of the time   Status New     OT LONG TERM GOAL #8   Title Pt will require min a for stand step transfers to toilet using grab bar for community toileting at least 75% of the time.   Status New     OT LONG TERM GOAL  #9   Baseline Pt will stand with contact gurard with only 1 UE support for static standing balance in prep for abiilty to engage in functional task while standing.    Status New               Plan - 04/29/16 1651    Clinical Impression Statement Pt continues with slow but steady progress Pt with slowly improving postural control as well as  improving sustained attention   Rehab Potential Fair   Clinical Impairments Affecting Rehab Potential severity of deficits, very slow rate of recovery   OT Frequency 2x / week   OT Duration 8 weeks    OT Treatment/Interventions Self-care/ADL training;Ultrasound;Moist Heat;Electrical Stimulation;DME and/or AE instruction;Neuromuscular education;Therapeutic exercise;Functional Mobility Training;Manual Therapy;Passive range of motion;Splinting;Therapeutic activities;Balance training;Patient/family education;Visual/perceptual remediation/compensation;Cognitive remediation/compensation   Plan futnctional use of UE;s, NMR for UE, trunk, LE's static to dynamic standing balance, finding midline, functional mobility   Consulted and Agree with Plan of Care Patient;Family member/caregiver   Family Member Consulted caregiver [name], husband [name]       Patient will benefit from skilled therapeutic intervention in order to improve the following deficits and impairments:  Decreased endurance, Decreased skin integrity, Impaired vision/preception, Improper body mechanics, Impaired perceived functional ability, Decreased knowledge of precautions, Cardiopulmonary status limiting activity, Decreased activity tolerance, Decreased knowledge of use of DME, Decreased strength, Impaired flexibility, Improper spinal/pelvic alignment, Impaired sensation, Difficulty walking, Decreased mobility, Decreased balance, Decreased cognition, Decreased range of motion, Impaired tone, Impaired UE functional use, Decreased safety awareness, Decreased coordination, Pain  Visit Diagnosis: Unsteadiness on feet  Abnormal posture  Muscle weakness (generalized)  Other symptoms and signs involving the nervous system  Stiffness of right shoulder, not elsewhere classified  Stiffness of left shoulder, not elsewhere classified  Stiffness of left elbow, not elsewhere classified  Frontal lobe and executive function deficit    Problem List Patient Active Problem List   Diagnosis Date Noted  . Heterotopic ossification of bone 03/10/2016  . Acute on chronic respiratory failure with hypoxia ([facility])   . Acute hypoxemic respiratory failure  ([facility])   . Acute blood loss anemia   . Atelectasis 02/04/2016  . Debility 02/03/2016  . Pneumonia of both lower lobes due to Pseudomonas species ([facility])   . Pneumonia of both lower lobes due to methicillin susceptible Staphylococcus aureus (MSSA) ([place])   . Acute respiratory failure with hypoxia ([facility])   . Acute encephalopathy   . Seizures ([place])   . Sepsis ([facility])   . Hypoxia   . Pain   . HCAP (healthcare-associated pneumonia) 12/28/2015  . Chronic respiratory failure ([facility]) 12/28/2015  . Acute tracheobronchitis 12/24/2015  . Tracheostomy tube present ([facility])   . Tracheal stenosis   . Chronic respiratory failure with hypoxia ([facility])   . 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 Tracheostomy status ([facility]) 09/26/2015  . Allergic rhinitis 09/26/2015  . 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 Movement disorder 09/22/2015  . Encephalitis 09/22/2015  . Chest pain 02/07/2014  . Premature atrial contractions 01/13/2014  . Abnormality of gait 12/04/2013  . Hyperlipidemia 12/21/2011  . Cardiovascular risk factor 12/21/2011  . Bunion 01/31/2008  . Metatarsalgia of both feet 09/20/2007  . FLAT FOOT 09/20/2007  . HALLUX RIGIDUS, ACQUIRED 09/20/2007    Quay Burow, OTR/L 04/29/2016, 4:57 PM  [facility] [address] [place], [place], [address] Phone: [phone]   Fax:  [phone]  Name: Judy Spears MRN: [id number] Date of Birth: [birth year]

## 2016-04-29 NOTE — Therapy (Signed)
[facility] [address] [place], [place], [address] Phone: [phone]   Fax:  [phone]  Speech Language Pathology Treatment  Patient Details  Name: SHELLYE ZANDI MRN: [id number] Date of Birth: 1950-10-23 Referring Provider: Charlett Blake, MD  Encounter Date: 04/29/2016      End of Session - 04/29/16 1812    Visit Number 17   Number of Visits 33   Date for SLP Re-Evaluation 06/25/16   SLP Start Time 9381   SLP Stop Time  1530   SLP Time Calculation (min) 39 min   Activity Tolerance Patient tolerated treatment well;Patient limited by fatigue      Past Medical History:  Diagnosis Date  . Arthritis    lt great toe  . Complication of anesthesia    pt has had headaches post op-did advise to hydra well preop  . Hair loss    right-sided  . History of exercise stress test    a. ETT (12/15) with 12:00 exercise, no ST changes, occasional PACs.   . Hyperlipidemia   . Insomnia   . Migraine headache   . 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 Postmenopausal   . Premature atrial contractions    a. Holter (12/15) with 8% PACs, no atrial runs or atrial fibrillation.     Past Surgical History:  Procedure Laterality Date  . BREAST BIOPSY  01/01/2011   Procedure: BREAST BIOPSY WITH NEEDLE LOCALIZATION;  Surgeon: Rolm Bookbinder, MD;  Location: [facility];  Service: General;  Laterality: Left;  left breast wire localization biopsy  . cataracts right eye    . CESAREAN SECTION  1986  . HERNIA REPAIR  2000   RIH  . opirectinal membrane peel    . RHINOPLASTY  1983    There were no vitals filed for this visit.      Subjective Assessment - 04/29/16 1800    Subjective Inaudible greeting to SLP   Patient is accompained by: Family member   Special Tests spouse, [name]; caregiver [name]   Currently in Pain? No/denies               ADULT SLP TREATMENT - 04/29/16 1450      General  Information   Behavior/Cognition Alert;Cooperative;Pleasant mood;Distractible;Requires cueing   Patient Positioning Upright in chair   Oral care provided Yes     Treatment Provided   Treatment provided Dysphagia;Cognitive-Linquistic     Dysphagia Treatment   Temperature Spikes Noted No   Respiratory Status Room air   Oral Cavity - Dentition Adequate natural dentition   Treatment Methods Skilled observation;Therapeutic exercise;Compensation strategy training;Upgraded PO texture trial;Patient/caregiver education   Patient observed directly with PO's Yes   Type of PO's observed Thin liquids   Feeding Total assist   Liquids provided via Cup   Oral Phase Signs & Symptoms Anterior loss/spillage;Prolonged bolus formation;Oral holding   Type of cueing Verbal;Tactile;Visual   Amount of cueing Moderate   Other treatment/comments Pt arrives, just having completed PT and OT. Voice is low in volume, with wet vocal quality. Verbal cues required for cough, reswallow. Cup sips of thin water with no overt signs of aspiration. Anterior oral spillage in 40% of trials, however with verbal cues and demonstration for lip closure, multiples swallows, pt improves containment.     Pain Assessment   Pain Assessment No/denies pain     Cognitive-Linquistic Treatment   Treatment focused on Dysarthria;Voice;Patient/family/caregiver education   Skilled Treatment With  repositioning (feet on floor, forward posture) vocal quality improves, though patient requires moderate multimodal cues for amplitude, breath support. Responds to basic questions with 4-6 word response, 85% intelligibility with consistent min A. Began fading support for dry swallow cue to improve intelligibility to visual cue only; pt completes with visual cue only in 60% of opportunities     Tracheostomy Shiley 4 mm Uncuffed   Tracheostomy Properties Placement Date: 02/24/16 Placement Time: [place] By: ICU physician Brand: Shiley Size (mm): 4 mm  Style: Uncuffed   Status Capped;Secured   Site Assessment Clean;Dry   Ties Assessment Clean;Dry;Secure     Assessment / Recommendations / Plan   Plan Continue with current plan of care;Goals updated;Other (Comment)  recertification for additional 16 visits     Dysphagia Recommendations   Diet recommendations Dysphagia 1 (puree);Thin liquid   Liquids provided via Cup;Teaspoon   Medication Administration Via alternative means   Supervision Full supervision/cueing for compensatory strategies;Trained caregiver to feed patient   Compensations Effortful swallow;Multiple dry swallows after each bite/sip;Check for anterior loss;Small sips/bites;Slow rate   Postural Changes and/or Swallow Maneuvers Seated upright 90 degrees     Progression Toward Goals   Progression toward goals Progressing toward goals          SLP Education - 04/29/16 1812    Education provided Yes   Education Details lip closure for saliva containment   Person(s) Educated Patient   Methods Explanation;Demonstration   Comprehension Returned demonstration;Verbal cues required          SLP Short Term Goals - 04/29/16 1818      SLP SHORT TERM GOAL #1   Title Patient will utilize intelligibility strategies with moderate visual cues from communication partner/visual aid to communicate 5-7 word wants, needs, thoughts and ideas.   Baseline 03/15/16, 04/05/16, 04/12/16, 04/19/16   Time 4   Period Weeks   Status Revised     SLP SHORT TERM GOAL #2   Title Patient will utilize intelligibility strategies, breath support with mod A for phrase repetition in 75% of trials.   Baseline 2.12.18, 04/05/16, 04/08/16, 04/12/16   Time 1   Status Achieved     SLP SHORT TERM GOAL #3   Title Pt will swallow thin liquids, purees and soft solids in 9/10 trials with no overt signs of aspiration.   Baseline 2.12.18, 03-12-16, 03-15-16   Time 4   Period Weeks   Status Achieved     SLP SHORT TERM GOAL #4   Title Patient will tolerate 6 oz  pleasure feeds of pureed solids and/or thin liquids, >95% with no overt signs of aspiration.   Baseline Husband reports pt ate "a whole bowl of soup" with no overt s/sx aspiration   Time 4   Period Weeks   Status Revised     SLP SHORT TERM GOAL #5   Title Patient will utilize verbal cues for lip closure, dry swallow for secretion management in 75% of opportunities.   Time 4   Period Weeks   Status New          SLP Long Term Goals - 04/29/16 1828      SLP LONG TERM GOAL #1   Title Patient will utilize multimodal communication/AAC to clarify wants, needs, thoughts or ideas during communication breakdowns with max A   Time 2   Period Weeks   Status Deferred     SLP LONG TERM GOAL #2   Title Patient will utilize intelligibility strategies, breath support with min A  visual cues for 5-7 word responses with 80% accuracy.   Time 8   Period Weeks   Status Revised     SLP LONG TERM GOAL #3   Title Patient will demonstrate readiness for MBS to determine safety for pleasure feeds by timely initiation of swallow in 3/5 trials with ice chips/H20.   Baseline 2.12.18   Time 2   Period Weeks   Status Achieved     SLP LONG TERM GOAL #4   Title Patient will adequately masticate and clear soft solids with mod verbal cues and no overt signs of aspiration in 4/5 trials.   Time 8   Period Weeks   Status Revised     SLP LONG TERM GOAL #5   Title Patient will utilize visual cue for lip closure, dry swallow for secretion management in 75% of opportunities.   Time 8   Period Weeks   Status New          Plan - 05/11/16 1814    Clinical Impression Statement Patient appeared fatigued this session, initially closing eyes upon arrival to session. Following initial cuing, pt alert and attentive through session tasks. Demonstrates emerging ability to attend and respond to visual cues only for dry swallow during speech intelligibility training. Improved lip closure and bolus containment this  session, suspect given improvement in attention this session vs. previous session. Recommending recertification for an additional 16 sessions, twice a week for 8 weeks given progress toward goals. Goals updated. Recommend skilled ST in order to maximize functional communication, swallowing safety, reduce caregiver burden and improve quality of life.   Speech Therapy Frequency 2x / week   Duration Other (comment)   Treatment/Interventions Aspiration precaution training;Trials of upgraded texture/liquids;Compensatory strategies;Functional tasks;Patient/family education;Cueing hierarchy;Diet toleration management by SLP;Cognitive reorganization;Compensatory techniques;SLP instruction and feedback;Internal/external aids;Multimodal communcation approach   Potential to Achieve Goals Good   Potential Considerations Ability to learn/carryover information;Co-morbidities;Severity of impairments   SLP Home Exercise Plan comfort feeds purees/liquids only with oral care prior to and after feeding, functional and phrase level tasks for breath pacing   Consulted and Agree with Plan of Care Family member/caregiver;Patient   Family Member Consulted husband chip, caregiver lisa      Patient will benefit from skilled therapeutic intervention in order to improve the following deficits and impairments:   Dysarthria and anarthria  Cognitive communication deficit  Dysphagia, oropharyngeal phase      G-Codes - 05-11-2016 1836    Functional Assessment Tool Used NOMS, clinical judgment   Functional Limitations Spoken language expressive   Swallow Current Status (G9211) --   Swallow Goal Status (H4174) --   Spoken Language Expression Current Status (Y8144) At least 60 percent but less than 80 percent impaired, limited or restricted   Spoken Language Expression Goal Status (Y1856) At least 40 percent but less than 60 percent impaired, limited or restricted      Problem List Patient Active Problem List   Diagnosis  Date Noted  . Heterotopic ossification of bone 03/10/2016  . Acute on chronic respiratory failure with hypoxia ([place] [facility])   . Acute hypoxemic respiratory failure ([facility])   . Acute blood loss anemia   . Atelectasis 02/04/2016  . Debility 02/03/2016  . Pneumonia of both lower lobes due to Pseudomonas species ([place])   . Pneumonia of both lower lobes due to methicillin susceptible Staphylococcus aureus (MSSA) ([facility])   . Acute respiratory failure with hypoxia ([place])   . Acute encephalopathy   . Seizures ([place])   . Sepsis (  [place])   . Hypoxia   . Pain   . HCAP (healthcare-associated pneumonia) 12/28/2015  . Chronic respiratory failure ([place] [facility]) 12/28/2015  . Acute tracheobronchitis 12/24/2015  . Tracheostomy tube present ([facility])   . Tracheal stenosis   . Chronic respiratory failure with hypoxia ([place])   . 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[facility]) 10/20/2015  . Tracheostomy status ([facility]) 09/26/2015  . Allergic rhinitis 09/26/2015  . 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 Movement disorder 09/22/2015  . Encephalitis 09/22/2015  . Chest pain 02/07/2014  . Premature atrial contractions 01/13/2014  . Abnormality of gait 12/04/2013  . Hyperlipidemia 12/21/2011  . Cardiovascular risk factor 12/21/2011  . Bunion 01/31/2008  . Metatarsalgia of both feet 09/20/2007  . FLAT FOOT 09/20/2007  . HALLUX RIGIDUS, ACQUIRED 09/20/2007   Deneise Lever, [place] CF-SLP Speech-Language Pathologist  Aliene Altes 04/29/2016, 6:43 PM  [facility] [address] [place], [place], [address] Phone: [phone]   Fax:  [phone]   Name: DANYELLE BROOKOVER MRN: [id number] Date of Birth: 10/28/1950

## 2016-04-29 NOTE — Therapy (Signed)
[facility] [address] [place], [place], [address] Phone: [phone]   Fax:  [phone]  Physical Therapy Treatment  Patient Details  Name: Judy Spears MRN: [id number] Date of Birth: 06-Jun-1950 Referring Provider: Alger Simons, MD/Judy Letta Pate, MD  Encounter Date: 04/29/2016      PT End of Session - 04/29/16 1949    Visit Number 17   Number of Visits 20   Date for PT Re-Evaluation 04/30/16   Authorization Type BCBS   PT Start Time 3710   PT Stop Time 1400   PT Time Calculation (min) 43 min   Activity Tolerance Patient tolerated treatment well   Behavior During Therapy Judy Spe[facility] for tasks assessed/performed      Past Medical History:  Diagnosis Date  . Arthritis    lt great toe  . Complication of anesthesia    pt has had headaches post op-did advise to hydra well preop  . Hair loss    right-sided  . History of exercise stress test    a. ETT (12/15) with 12:00 exercise, no ST changes, occasional PACs.   . Hyperlipidemia   . Insomnia   . Migraine headache   . 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 Postmenopausal   . Premature atrial contractions    a. Holter (12/15) with 8% PACs, no atrial runs or atrial fibrillation.     Past Surgical History:  Procedure Laterality Date  . BREAST BIOPSY  01/01/2011   Procedure: BREAST BIOPSY WITH NEEDLE LOCALIZATION;  Surgeon: Judy Bookbinder, MD;  Location: [facility];  Service: General;  Laterality: Left;  left breast wire localization biopsy  . cataracts right eye    . CESAREAN SECTION  1986  . HERNIA REPAIR  2000   RIH  . opirectinal membrane peel    . RHINOPLASTY  1983    There were no vitals filed for this visit.      Subjective Assessment - 04/29/16 1934    Subjective Pt reports [employer] [facility] evaluation went well, she met teacher,  and that she will go Tuesdays at 115pm.     Patient is accompained by: Family member   Pertinent  History Precautions:  PEG.  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  Another round of botox in LLE on 02/06/16   Limitations House hold activities;Walking;Standing;Sitting   Patient Stated Goals Per husband, "For [name] to be as independent as possible."   Currently in Pain? No/denies                         [facility] PT Treatment/Exercise - 04/29/16 0001      Bed Mobility   Bed Mobility Rolling Right;Rolling Left;Right Sidelying to Sit;Left Sidelying to Sit;Sit to Sidelying Right;Sit to Sidelying Left   Rolling Right 5: Supervision   Rolling Right Details (indicate cue type and reason) Cues for sequencing and visual targeting due to vertigo   Rolling Left 4: Min assist   Rolling Left Details (indicate cue type and reason) Assist for LE management and cues for reaching across body.  Again cues for visual targeting to decrease dizziness.     Right Sidelying to Sit 4: Min assist   Right Sidelying to Sit Details (indicate cue type and reason) Pt requires very light assist  for LEs and some assist for elevating onto RUE to get into sitting, however note that once movement initiated, she did well finishing movement.    Left Sidelying to Sit 3: Mod assist   Left Sidelying to Sit Details (indicate cue type and reason) Assist for LE management (this is more due to pt getting nauseous in SL, therefore PT assisted more quickly).  Pt did well self assisting with LUE to push into sitting.    Sitting - Scoot to [employer] of Bed 3: Mod assist   Sitting - Scoot to Edge of Bed Details (indicate cue type and reason) Cues for lateral weight shift and to bring hips to EOB   Sit to Sidelying Right 2: Max assist   Sit to Sidelying Right Details (indicate cue type and reason) Max A to get down to R elbow today, however  she has done in previous session at min A level.  She is able to bring BLEs onto mat with light assist for RLE   Sit to Sidelying Left 3: Mod assist   Sit to Sidelying Left Details (indicate cue type and reason) Pt better able to initiate going onto L elbow, however requires assist to guide arm as to ensure not to lie directly on elbow.  Cues during both to visual targeting due to dizziness/nystagmus.  PT feels that BPPV (she also has central vertigo) is causing nausea in sit<>supine movements.  Educated on performing repeated sit<>SL to both sides at home to habituate vertigo.  Spouse and caregiver verbalized understanding.       Transfers   Transfers Sit to Stand;Stand to [employer] Transfers   Sit to Stand 4: Min assist;3: Mod assist   Sit to Stand Details Tactile cues for sequencing;Verbal cues for technique;Tactile cues for posture;Tactile cues for weight shifting;Manual facilitation for weight shifting;Manual facilitation for placement   Sit to Stand Details (indicate cue type and reason) depending on arm placement, pt requires up to mod A.  If she is able to initiate with "reaching" for assist, she is able to perform at min A level.  She did well during session pushing from mat and was min A initially, however once elevated buttocks from mat, requires mod A to obtain ful upright standing due to LOB to the R.    Stand to Sit 4: Min assist   Stand to Sit Details (indicate cue type and reason) Manual facilitation for weight shifting;Tactile cues for weight shifting;Verbal cues for technique;Tactile cues for sequencing;Tactile cues for initiation;Verbal cues for sequencing;Manual facilitation for weight bearing   Squat Pivot Transfers 3: Mod assist;With upper extremity assistance   Squat Pivot Transfer Details (indicate cue type and reason) Attempted to perform squat pivot transfer during session to increase independence with transfers.  She requires mod A with heavy cues for initiation and hand  placement throughout as well as forward trunk lean.       Balance   Balance Assessed Yes     Static Sitting Balance   Static Sitting - Balance Support No upper extremity supported;Feet supported   Static Sitting - Level of Assistance 5: Stand by assistance     Dynamic Sitting Balance   Dynamic Sitting - Balance Support No upper extremity supported;Feet supported   Dynamic Sitting - Level of Assistance 5: Stand by assistance   Sitting balance - Comments had pt perform forward and lateral reaching without UE support during session at close S level with cues for posture.  PT Education - 04/29/16 1948    Education provided Yes   Education Details habituation for vestibular deficits   Person(s) Educated Patient;Spouse;Caregiver(s)   Methods Explanation   Comprehension Verbalized understanding          PT Short Term Goals - 03/30/16 2105      PT SHORT TERM GOAL #1   Title Pt will consistently perform supine rolling in B directions with min A (with rails as needed) in order to indicate increased trunk dissociation, increased independence with bed mobility.  (Target date for all STGs: 03/29/16)   Baseline rolled to R with CGA to min assist on 03-30-16; min assist from supine to Lt sidelying   Status Achieved     PT SHORT TERM GOAL #2   Title Pt will consistently perform supine <> sit with min A to indicate increased independence getting out of bed.    Baseline min to mod assist needed on 03-30-16   Status On-going     PT SHORT TERM GOAL #3   Title Pt will demonstrate midline posture in static sitting with BLE support and RUE support at a S level to indicate increased postural awareness/control.     Baseline met 03-30-16   Status Achieved     PT SHORT TERM GOAL #4   Title Pt will consistently perform functional transfers from w/c <> mat table with mod A to decrease caregiver burden.     Baseline mod assist with stand pivot transfers - 03-30-16   Status Achieved      PT SHORT TERM GOAL #5   Title Pt will consistently perform sit <> stand from personal w/c with BUE support and mod A to decrease caregiver burden.     Baseline met 03-30-16   Status Achieved     PT SHORT TERM GOAL #6   Title Pt will perform static standing with BUE support x1 minute with supervision while in stedy, no overt LOB to decrease caregiver burden.    Baseline pt able to stand unsupported with mod to min assist - not using Stedy in PT  (03-30-16)   Status Achieved           PT Long Term Goals - 04/29/16 1348      PT LONG TERM GOAL #1   Title Pt will perform supine <> sit with supervision 75% of the time to indicate increased independence getting out of bed.   (Target for all LTGs: 04/26/16)   Baseline Requires varying amounts of assist from min to max A,  see bed mobility section for details.  04/29/16   Time 8   Period Weeks   Status Partially Met     PT LONG TERM GOAL #2   Title Pt will demonstrate midline posture in static sitting with BLE support without UE support to indicate increased postural awareness/control.    Baseline met at S level 04/29/16   Time 8   Period Weeks   Status Achieved     PT LONG TERM GOAL #3   Title Pt will consistently perform functional transfers at min A level 75% of the time to decrease caregiver burden.     Baseline Consistently performing stand pivot transfers at min A level at least 75% of time 04/29/16   Time 8   Period Weeks   Status Achieved     PT LONG TERM GOAL #4   Title Pt will perform sit <> stand from personal w/c with BUE support and min A, 75% of time to  decrease caregiver burden.     Baseline Pt is able to perform at min A level with she reaches for caregiver for sit<>stand at least 75% of time 04/29/16   Time 8   Period Weeks   Status Achieved     PT LONG TERM GOAL #5   Title Pt will demonstrate ability to ambulate up to 5' w/ LRAD with max A in order to better assist with getting to/from restroom to decrease burden of care.     Time 8   Period Weeks   Status New               Plan - 04/29/16 1949    Clinical Impression Statement Skilled session focused on addressing LTGs.  Pt has met 3/5 LTGs, partially meeting a 4th and will address gait goal at next session.  Pt making slow but steady progress with transitional movements and feel that she will continue to benefit from skilled OP neuro PT.  PT to write for 8 week extension at next visit.  See goal updates on next visit.     Rehab Potential Fair   Clinical Impairments Affecting Rehab Potential Chronicity of condition, continued medical management, but has very strong family and financial support   PT Frequency 2x / week   PT Duration 8 weeks   PT Treatment/Interventions ADLs/Self Care Home Management;DME Instruction;Gait training;Stair training;Functional mobility training;Therapeutic activities;Patient/family education;Cognitive remediation;Neuromuscular re-education;Therapeutic exercise;Balance training;Orthotic Fit/Training;Wheelchair mobility training;Manual techniques;Splinting;Visual/perceptual remediation/compensation;Vestibular;Passive range of motion;Electrical Stimulation   PT Next Visit Plan bed mobility, cont. transfer training, gait and balance training; activities to fascilitate Lt trunk elongation   Consulted and Agree with Plan of Care Patient;Family member/caregiver   Family Member Consulted caregiver, [name] and husband [name]      Patient will benefit from skilled therapeutic intervention in order to improve the following deficits and impairments:  Abnormal gait, Decreased balance, Decreased endurance, Decreased cognition, Cardiopulmonary status limiting activity, Decreased activity tolerance, Decreased coordination, Decreased strength, Impaired flexibility, Impaired tone, Decreased mobility, Increased muscle spasms, Impaired vision/preception, Decreased range of motion, Impaired UE functional use, Postural dysfunction, Improper body mechanics,  Impaired perceived functional ability, Decreased knowledge of precautions, Decreased knowledge of use of DME  Visit Diagnosis: Unsteadiness on feet  Abnormal posture  Muscle weakness (generalized)  Other abnormalities of gait and mobility     Problem List Patient Active Problem List   Diagnosis Date Noted  . Heterotopic ossification of bone 03/10/2016  . Acute on chronic respiratory failure with hypoxia ([place])   . Acute hypoxemic respiratory failure ([place])   . Acute blood loss anemia   . Atelectasis 02/04/2016  . Debility 02/03/2016  . Pneumonia of both lower lobes due to Pseudomonas species ([place])   . Pneumonia of both lower lobes due to methicillin susceptible Staphylococcus aureus (MSSA) ([place])   . Acute respiratory failure with hypoxia ([place])   . Acute encephalopathy   . Seizures ([place])   . Sepsis ([place])   . Hypoxia   . Pain   . HCAP (healthcare-associated pneumonia) 12/28/2015  . Chronic respiratory failure ([place]) 12/28/2015  . Acute tracheobronchitis 12/24/2015  . Tracheostomy tube present ([place])   . Tracheal stenosis   . Chronic respiratory failure with hypoxia ([place])   . 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[place]) 10/20/2015  . Tracheostomy status ([place]) 09/26/2015  . Allergic rhinitis 09/26/2015  . 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 Movement disorder 09/22/2015  . Encephalitis 09/22/2015  . Chest pain 02/07/2014  . Premature atrial contractions 01/13/2014  . Abnormality of gait 12/04/2013  .  Hyperlipidemia 12/21/2011  . Cardiovascular risk factor 12/21/2011  . Bunion 01/31/2008  . Metatarsalgia of both feet 09/20/2007  . FLAT FOOT 09/20/2007  . HALLUX RIGIDUS, ACQUIRED 09/20/2007    Cameron Sprang, PT, MPT [facility] [address] [place], [place], [address] Phone: [phone]   Fax:  [phone] 04/29/16, 7:57 PM  Name: Judy [facility]ars MRN: [id number] Date of Birth: 05-13-1950

## 2016-04-30 ENCOUNTER — Encounter: Payer: Self-pay | Admitting: Rehabilitation

## 2016-04-30 ENCOUNTER — Encounter: Payer: Self-pay | Admitting: Occupational Therapy

## 2016-04-30 ENCOUNTER — Ambulatory Visit: Payer: BC Managed Care – PPO | Admitting: Rehabilitation

## 2016-04-30 ENCOUNTER — Ambulatory Visit: Payer: BC Managed Care – PPO

## 2016-04-30 ENCOUNTER — Ambulatory Visit: Payer: BC Managed Care – PPO | Admitting: Occupational Therapy

## 2016-04-30 DIAGNOSIS — R41841 Cognitive communication deficit: Secondary | ICD-10-CM

## 2016-04-30 DIAGNOSIS — M25611 Stiffness of right shoulder, not elsewhere classified: Secondary | ICD-10-CM

## 2016-04-30 DIAGNOSIS — M6281 Muscle weakness (generalized): Secondary | ICD-10-CM

## 2016-04-30 DIAGNOSIS — R2681 Unsteadiness on feet: Secondary | ICD-10-CM

## 2016-04-30 DIAGNOSIS — M25622 Stiffness of left elbow, not elsewhere classified: Secondary | ICD-10-CM

## 2016-04-30 DIAGNOSIS — R1312 Dysphagia, oropharyngeal phase: Secondary | ICD-10-CM

## 2016-04-30 DIAGNOSIS — M25612 Stiffness of left shoulder, not elsewhere classified: Secondary | ICD-10-CM

## 2016-04-30 DIAGNOSIS — R29818 Other symptoms and signs involving the nervous system: Secondary | ICD-10-CM

## 2016-04-30 DIAGNOSIS — R2689 Other abnormalities of gait and mobility: Secondary | ICD-10-CM

## 2016-04-30 DIAGNOSIS — R471 Dysarthria and anarthria: Secondary | ICD-10-CM

## 2016-04-30 DIAGNOSIS — R41844 Frontal lobe and executive function deficit: Secondary | ICD-10-CM

## 2016-04-30 DIAGNOSIS — R278 Other lack of coordination: Secondary | ICD-10-CM

## 2016-04-30 DIAGNOSIS — R293 Abnormal posture: Secondary | ICD-10-CM

## 2016-04-30 NOTE — Therapy (Signed)
[facility] [address] [place], [place], [address] Phone: [phone]   Fax:  [phone]  Occupational Therapy Treatment  Patient Details  Name: Judy [facility] MRN: [id number] Date of Birth: 11-03-1950 Referring Provider: Dr. Quita Skye. [name]  Encounter Date: 04/30/2016      OT End of Session - 04/30/16 1706    Visit Number 17   Number of Visits 32   Date for OT Re-Evaluation 04/26/16   Authorization Type BCBS unlimited visits   OT Start Time 1455  Therapist late from prior patient session   OT Stop Time 1530   OT Time Calculation (min) 35 min   Activity Tolerance Patient tolerated treatment well   Behavior During Therapy [facility] for tasks assessed/performed      Past Medical History:  Diagnosis Date  . Arthritis    lt great toe  . Complication of anesthesia    pt has had headaches post op-did advise to hydra well preop  . Hair loss    right-sided  . History of exercise stress test    a. ETT (12/15) with 12:00 exercise, no ST changes, occasional PACs.   . Hyperlipidemia   . Insomnia   . Migraine headache   . 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 Postmenopausal   . Premature atrial contractions    a. Holter (12/15) with 8% PACs, no atrial runs or atrial fibrillation.     Past Surgical History:  Procedure Laterality Date  . BREAST BIOPSY  01/01/2011   Procedure: BREAST BIOPSY WITH NEEDLE LOCALIZATION;  Surgeon: Rolm Bookbinder, MD;  Location: [facility];  Service: General;  Laterality: Left;  left breast wire localization biopsy  . cataracts right eye    . CESAREAN SECTION  1986  . HERNIA REPAIR  2000   RIH  . opirectinal membrane peel    . RHINOPLASTY  1983    There were no vitals filed for this visit.      Subjective Assessment - 04/30/16 1701    Subjective  It was cool- regarding recent trip to [place]   Patient is accompained by: Family member   Pertinent  History see epic snapshot - ABI/encephalitis/hypoxia from virus; recent hospitalization due to ARF;    Patient Stated Goals Pt unable to state.    Currently in Pain? No/denies   Pain Score 0-No pain                      OT Treatments/Exercises (OP) - 04/30/16 0001      Neurological Re-education Exercises   Other Exercises 1 Neuromuscular reeducation to address alignment and activation for sit to/from stand.  Patient with significantly improved use of LE's for initial sit to lift off, decreased use of UE's to pull up.  Patient with improved ability to maintain more midline orientation with initial stage.  With lift off to stand patient with consistent increased forward weight translation - poorly graded muscle control, use of momentum, and poor balance.  Patient with best response with slow controlled well anticipated movements.  Worked in mid range squat to stand, squat to partial sit.  Followed with stand step transfers with controlled weight shift through lower body.                  OT Education - 04/30/16 1705    Education provided Yes   Education Details Alignment for standing, and sit to stand   Person(s) Educated Patient;Spouse;Caregiver(s)  Methods Explanation;Demonstration   Comprehension Verbalized understanding;Returned demonstration;Need further instruction          OT Short Term Goals - 04/29/16 1633      OT SHORT TERM GOAL #1   Title Pt, husband and cargiver will be mod I with HEP - 03/29/2016   Status Achieved     OT SHORT TERM GOAL #2   Title Pt will be able to sit with supervision without UE support and vc's to assist with midline 75% while engage in functional task   Status Achieved     OT SHORT TERM GOAL #3   Title Pt will be able to complete sit to stand consistently with mod A for toilet transfers (max a for pivot)   Status Achieved     OT SHORT TERM GOAL #4   Title Pt, husband and caregiver will verbalize understanding of various  bathroom accomodations to reduce long term caregiver burden   Status Achieved     OT SHORT TERM GOAL #5   Title Pt will be able to stand step transfer to toilet with mo more than mod a, mod vc's consistently using grab bar for community toileting needs- 05/27/2016   Status New     OT SHORT TERM GOAL #6   Title Pt will be able to wash face with supervision/max cues 50% of the time, min a 50% of the time.    Status New     OT SHORT TERM GOAL #7   Title Pt will be able to don pull over shirt with mod a, max cues   Status New     OT SHORT TERM GOAL #8   Title Pt will be able to maintain static standing with UE support with close supervision to reduce burden of care for caregivers    Status New           OT Long Term Goals - 04/29/16 1633      OT LONG TERM GOAL #1   Title Pt, husband and caregiver will be mod I with upgraded HEP prn - 04/26/2016   Status Achieved     OT LONG TERM GOAL #2   Title Pt, husband and caregiver will be mod I with splint wear and care    Status Achieved     OT LONG TERM GOAL #3   Title Pt will be able to wash face with min a only   Status Achieved     OT LONG TERM GOAL #4   Title Pt will be able to sit without UE support with supevision 100% of the time with no more than 2 vc's while engaged in functional activity.   Status Achieved     OT LONG TERM GOAL #5   Title Pt, husband and caregiver will verbalize understanding of potential after care therapeutic opportunities. - 06/24/2016   Status On-going     OT LONG TERM GOAL #6   Title Pt will be supervision to wash face consistently   Status New     OT LONG TERM GOAL #7   Title Pt will don shirt with min a and max vc's 50% of the time   Status New     OT LONG TERM GOAL #8   Title Pt will require min a for stand step transfers to toilet using grab bar for community toileting at least 75% of the time.   Status New     OT LONG TERM GOAL  #9   Baseline Pt will stand with  contact gurard with only 1  UE support for static standing balance in prep for abiilty to engage in functional task while standing.    Status New               Plan - 04/30/16 1707    Clinical Impression Statement Patient continues to progress with postural control leading to steady improvement with functional mobility   Rehab Potential Fair   Clinical Impairments Affecting Rehab Potential severity of deficits, very slow rate of recovery   OT Frequency 2x / week   OT Duration 8 weeks   OT Treatment/Interventions Self-care/ADL training;Ultrasound;Moist Heat;Electrical Stimulation;DME and/or AE instruction;Neuromuscular education;Therapeutic exercise;Functional Mobility Training;Manual Therapy;Passive range of motion;Splinting;Therapeutic activities;Balance training;Patient/family education;Visual/perceptual remediation/compensation;Cognitive remediation/compensation   Plan Functional use of UE's, Static to dynamic stand balance, functional mobility   OT Home Exercise Plan Reviewed plan for home management for left elbow range of motion - JAS 3x/day FOR 30 min./time, and locked elbow extesnion brace at night   Consulted and Agree with Plan of Care Patient;Family member/caregiver   Family Member Consulted caregiver [name], husband [name]       Patient will benefit from skilled therapeutic intervention in order to improve the following deficits and impairments:  Decreased endurance, Decreased skin integrity, Impaired vision/preception, Improper body mechanics, Impaired perceived functional ability, Decreased knowledge of precautions, Cardiopulmonary status limiting activity, Decreased activity tolerance, Decreased knowledge of use of DME, Decreased strength, Impaired flexibility, Improper spinal/pelvic alignment, Impaired sensation, Difficulty walking, Decreased mobility, Decreased balance, Decreased cognition, Decreased range of motion, Impaired tone, Impaired UE functional use, Decreased safety awareness, Decreased  coordination, Pain  Visit Diagnosis: Unsteadiness on feet  Abnormal posture  Muscle weakness (generalized)  Other symptoms and signs involving the nervous system  Stiffness of right shoulder, not elsewhere classified  Stiffness of left shoulder, not elsewhere classified  Stiffness of left elbow, not elsewhere classified  Frontal lobe and executive function deficit  Other lack of coordination    Problem List Patient Active Problem List   Diagnosis Date Noted  . Heterotopic ossification of bone 03/10/2016  . Acute on chronic respiratory failure with hypoxia ([place])   . Acute hypoxemic respiratory failure ([place])   . Acute blood loss anemia   . Atelectasis 02/04/2016  . Debility 02/03/2016  . Pneumonia of both lower lobes due to Pseudomonas species ([place])   . Pneumonia of both lower lobes due to methicillin susceptible Staphylococcus aureus (MSSA) ([place])   . Acute respiratory failure with hypoxia ([place])   . Acute encephalopathy   . Seizures ([place])   . Sepsis ([place])   . Hypoxia   . Pain   . HCAP (healthcare-associated pneumonia) 12/28/2015  . Chronic respiratory failure ([place]) 12/28/2015  . Acute tracheobronchitis 12/24/2015  . Tracheostomy tube present ([place])   . Tracheal stenosis   . Chronic respiratory failure with hypoxia ([place])   . 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[place]) 10/20/2015  . Tracheostomy status ([place]) 09/26/2015  . Allergic rhinitis 09/26/2015  . 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 Movement disorder 09/22/2015  . Encephalitis 09/22/2015  . Chest pain 02/07/2014  . Premature atrial contractions 01/13/2014  . Abnormality of gait 12/04/2013  . Hyperlipidemia 12/21/2011  . Cardiovascular risk factor 12/21/2011  . Bunion 01/31/2008  . Metatarsalgia of both feet 09/20/2007  . FLAT FOOT 09/20/2007  . HALLUX RIGIDUS, ACQUIRED 09/20/2007    Mariah Milling, OTR/L 04/30/2016, 5:13 PM  [facility] [address] [place], [place], [address] Phone: [phone]   Fax:  [place]  Name: SELICIA WINDOM MRN: [id number] Date of Birth: February 15, 1950

## 2016-04-30 NOTE — Therapy (Signed)
[facility] [address] [place], [place], [address] Phone: [phone]   Fax:  [phone]  Speech Language Pathology Treatment  Patient Details  Name: Judy Spears MRN: [id number] Date of Birth: [birth year] Referring Provider: Charlett Blake, MD  Encounter Date: 04/30/2016      End of Session - 04/30/16 1540    Visit Number 18   Number of Visits 33   Date for SLP Re-Evaluation 06/25/16   SLP Start Time 1404   SLP Stop Time  1446   SLP Time Calculation (min) 42 min   Activity Tolerance Patient tolerated treatment well      Past Medical History:  Diagnosis Date  . Arthritis    lt great toe  . Complication of anesthesia    pt has had headaches post op-did advise to hydra well preop  . Hair loss    right-sided  . History of exercise stress test    a. ETT (12/15) with 12:00 exercise, no ST changes, occasional PACs.   . Hyperlipidemia   . Insomnia   . Migraine headache   . 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 Postmenopausal   . Premature atrial contractions    a. Holter (12/15) with 8% PACs, no atrial runs or atrial fibrillation.     Past Surgical History:  Procedure Laterality Date  . BREAST BIOPSY  01/01/2011   Procedure: BREAST BIOPSY WITH NEEDLE LOCALIZATION;  Surgeon: Rolm Bookbinder, MD;  Location: [facility];  Service: General;  Laterality: Left;  left breast wire localization biopsy  . cataracts right eye    . CESAREAN SECTION  1986  . HERNIA REPAIR  2000   RIH  . opirectinal membrane peel    . RHINOPLASTY  1983    There were no vitals filed for this visit.      Subjective Assessment - 04/30/16 1415    Subjective Inaudible greeting to SLP   Patient is accompained by: Family member   Special Tests spouse, [name]; caregiver [name]               ADULT SLP TREATMENT - 04/30/16 1416      General Information   Behavior/Cognition Alert;Cooperative;Pleasant  mood;Requires cueing;Distractible     Treatment Provided   Treatment provided Cognitive-Linquistic     Dysphagia Treatment   Temperature Spikes Noted No   Type of PO's observed Thin liquids   Pharyngeal Phase Signs & Symptoms Immediate cough;Wet vocal quality;Audible swallow  x1/4 presentations H2O   Other treatment/comments Caregiver reports pt with throat congestion after oral care this afternoon after her lunchtime meal. SLP unable to clear with cued very weak throat clears as well as swallows with small amounts H2O, after oral care by SLP. Did not clear >25 seconds after spontaneous cough x2.  Audible swallows consistently suggesting air intrusion into oral and/or pharyngeal portions of the swallow. Mod-max (verbal, visual, tactile) cues necessary occasionally for dry swallow upon command.     Cognitive-Linquistic Treatment   Treatment focused on Voice   Skilled Treatment Pt was repositioned during session for optimal breath support and voicing. Rote speech tasks were completed between SLP and pt alternating responses with voicing approx 50% of the time, and adeuqate breath seen 60% of the time with visual and verbal cues occasionally.      Tracheostomy Shiley 4 mm Uncuffed   Tracheostomy Properties Placement Date: 02/24/16 Placement Time: 0927 Placed By: ICU physician Brand: Shiley Size (mm): 4  mm Style: Uncuffed   Status Capped;Secured   Site Assessment Clean;Dry   Ties Assessment Clean;Dry;Secure     Assessment / Recommendations / Plan   Plan Continue with current plan of care     Dysphagia Recommendations   Diet recommendations Dysphagia 1 (puree)   Liquids provided via Cup;Teaspoon   Medication Administration Via alternative means   Supervision Full supervision/cueing for compensatory strategies;Trained caregiver to feed patient   Compensations Effortful swallow;Multiple dry swallows after each bite/sip;Check for anterior loss;Small sips/bites;Slow rate   Postural Changes  and/or Swallow Maneuvers Seated upright 90 degrees     Progression Toward Goals   Progression toward goals Progressing toward goals          SLP Education - 04/29/16 1812    Education provided Yes   Education Details lip closure for saliva containment   Person(s) Educated Patient   Methods Explanation;Demonstration   Comprehension Returned demonstration;Verbal cues required          SLP Short Term Goals - 04/30/16 1543      SLP SHORT TERM GOAL #1   Title Patient will utilize intelligibility strategies with moderate visual cues from communication partner/visual aid to communicate 5-7 word wants, needs, thoughts and ideas.   Baseline 03/15/16, 04/05/16, 04/12/16, 04/19/16   Time 4   Period Weeks   Status Revised     SLP SHORT TERM GOAL #2   Title Patient will utilize intelligibility strategies, breath support with mod A for phrase repetition in 75% of trials.   Baseline 2.12.18, 04/05/16, 04/08/16, 04/12/16   Time 1   Status Achieved     SLP SHORT TERM GOAL #3   Title Pt will swallow thin liquids, purees and soft solids in 9/10 trials with no overt signs of aspiration.   Baseline 2.12.18, 03-12-16, 03-15-16   Time 4   Period Weeks   Status Achieved     SLP SHORT TERM GOAL #4   Title Patient will tolerate 6 oz pleasure feeds of pureed solids and/or thin liquids, >95% with no overt signs of aspiration.   Baseline Husband reports pt ate "a whole bowl of soup" with no overt s/sx aspiration   Time 4   Period Weeks   Status Revised     SLP SHORT TERM GOAL #5   Title Patient will utilize verbal cues for lip closure, dry swallow for secretion management in 75% of opportunities.   Time 4   Period Weeks   Status New          SLP Long Term Goals - 04/30/16 1544      SLP LONG TERM GOAL #1   Title Patient will utilize multimodal communication/AAC to clarify wants, needs, thoughts or ideas during communication breakdowns with max A   Time 2   Period Weeks   Status Deferred      SLP LONG TERM GOAL #2   Title Patient will utilize intelligibility strategies, breath support with min A visual cues for 5-7 word responses with 80% accuracy.   Time 8   Period Weeks   Status Revised     SLP LONG TERM GOAL #3   Title Patient will demonstrate readiness for MBS to determine safety for pleasure feeds by timely initiation of swallow in 3/5 trials with ice chips/H20.   Baseline 2.12.18   Time 2   Period Weeks   Status Achieved     SLP LONG TERM GOAL #4   Title Patient will adequately masticate and clear soft solids with mod  verbal cues and no overt signs of aspiration in 4/5 trials.   Time 8   Period Weeks   Status Revised     SLP LONG TERM GOAL #5   Title Patient will utilize visual cue for lip closure, dry swallow for secretion management in 75% of opportunities.   Time 8   Period Weeks   Status New          Plan - 04/30/16 1540    Clinical Impression Statement Patient remained alert and attentive for the entire session. Presented with need for external cuing for management of hydrophonic voice, which today was largely unsuccessful due to insufficient cued cough/throat clear. Recommended pt continue with liquids, purees only at this time, ice if requesting chewing sensation.  Monitor for overt signs of aspiration and cease feeding if observed. Pt req'd cues from SLP for proper breath support as well as usage of voicing during session today. Recommend skilled ST in order to maximize functional communication, swallowing safety, reduce caregiver burden and improve quality of life.   Speech Therapy Frequency 2x / week   Duration Other (comment)   Treatment/Interventions Aspiration precaution training;Trials of upgraded texture/liquids;Compensatory strategies;Functional tasks;Patient/family education;Cueing hierarchy;Diet toleration management by SLP;Cognitive reorganization;Compensatory techniques;SLP instruction and feedback;Internal/external aids;Multimodal  communcation approach   Potential to Achieve Goals Good   Potential Considerations Ability to learn/carryover information;Co-morbidities;Severity of impairments   SLP Home Exercise Plan comfort feeds purees/liquids only with oral care prior to and after feeding, functional and phrase level tasks for breath pacing   Consulted and Agree with Plan of Care Family member/caregiver;Patient   Family Member Consulted husband chip, caregiver lisa      Patient will benefit from skilled therapeutic intervention in order to improve the following deficits and impairments:   Dysarthria and anarthria  Cognitive communication deficit  Dysphagia, oropharyngeal phase      G-Codes - May 08, 2016 1836    Functional Assessment Tool Used NOMS, clinical judgment   Functional Limitations Spoken language expressive   Swallow Current Status (D6222) --   Swallow Goal Status (L7989) --   Spoken Language Expression Current Status (Q1194) At least 60 percent but less than 80 percent impaired, limited or restricted   Spoken Language Expression Goal Status (R7408) At least 40 percent but less than 60 percent impaired, limited or restricted      Problem List Patient Active Problem List   Diagnosis Date Noted  . Heterotopic ossification of bone 03/10/2016  . Acute on chronic respiratory failure with hypoxia ([place])   . Acute hypoxemic respiratory failure ([place])   . Acute blood loss anemia   . Atelectasis 02/04/2016  . Debility 02/03/2016  . Pneumonia of both lower lobes due to Pseudomonas species ([place])   . Pneumonia of both lower lobes due to methicillin susceptible Staphylococcus aureus (MSSA) ([place])   . Acute respiratory failure with hypoxia ([place])   . Acute encephalopathy   . Seizures ([place])   . Sepsis ([place])   . Hypoxia   . Pain   . HCAP (healthcare-associated pneumonia) 12/28/2015  . Chronic respiratory failure ([place]) 12/28/2015  . Acute tracheobronchitis 12/24/2015  . Tracheostomy tube present ([place])   .  Tracheal stenosis   . Chronic respiratory failure with hypoxia ([place])   . 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 Tracheostomy status ([place]) 09/26/2015  . Allergic rhinitis 09/26/2015  . 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 Movement disorder 09/22/2015  . Encephalitis 09/22/2015  . Chest pain 02/07/2014  . Premature atrial contractions 01/13/2014  . Abnormality of  gait 12/04/2013  . Hyperlipidemia 12/21/2011  . Cardiovascular risk factor 12/21/2011  . Bunion 01/31/2008  . Metatarsalgia of both feet 09/20/2007  . FLAT FOOT 09/20/2007  . HALLUX RIGIDUS, ACQUIRED 09/20/2007    [facility], [place]  04/30/2016, 3:45 PM  [facility] [address] [place], [place], [address] Phone: [phone]   Fax:  [phone]   Name: Judy Spears MRN: [id number] Date of Birth: 03-08-1950

## 2016-05-01 ENCOUNTER — Encounter: Payer: Self-pay | Admitting: Rehabilitation

## 2016-05-01 NOTE — Therapy (Signed)
[facility] [address] [place], [place], [address] Phone: [phone]   Fax:  [phone]  Physical Therapy Treatment  Patient Details  Name: Judy Spears MRN: [id number] Date of Birth: Nov 24, 1950 Referring Provider: Alger Simons, MD/Andrew Letta Pate, MD  Encounter Date: 04/30/2016      PT End of Session - 05/01/16 0944    Visit Number 18   Number of Visits 36  per updated POC   Date for PT Re-Evaluation 06/29/16  Per updated POC   Authorization Type BC[employer]   PT Start Time 7253   PT Stop Time 1610   PT Time Calculation (min) 40 min   Activity Tolerance Patient tolerated treatment well   Behavior During Therapy [facility] for tasks assessed/performed      Past Medical History:  Diagnosis Date  . Arthritis    lt great toe  . Complication of anesthesia    pt has had headaches post op-did advise to hydra well preop  . Hair loss    right-sided  . History of exercise stress test    a. ETT (12/15) with 12:00 exercise, no ST changes, occasional PACs.   . Hyperlipidemia   . Insomnia   . Migraine headache   . 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 Postmenopausal   . Premature atrial contractions    a. Holter (12/15) with 8% PACs, no atrial runs or atrial fibrillation.     Past Surgical History:  Procedure Laterality Date  . BREAST BIOPSY  01/01/2011   Procedure: BREAST BIOPSY WITH NEEDLE LOCALIZATION;  Surgeon: Rolm Bookbinder, MD;  Location: [facility];  Service: General;  Laterality: Left;  left breast wire localization biopsy  . cataracts right eye    . CESAREAN SECTION  1986  . HERNIA REPAIR  2000   RIH  . opirectinal membrane peel    . RHINOPLASTY  1983    There were no vitals filed for this visit.      Subjective Assessment - 05/01/16 0939    Subjective Reports that she is "okay" today.  Spouse/caregiver report no changes.    Patient is accompained by: Family member    Pertinent History Precautions:  PEG.  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  Another round of botox in LLE on 02/06/16   Limitations House hold activities;Walking;Standing;Sitting   Patient Stated Goals Per husband, "For [name] to be as independent as possible."   Currently in Pain? No/denies                         [facility] Adult PT Treatment/Exercise - 04/30/16 1540      Transfers   Transfers Sit to Stand;Stand to [employer]   Sit to Stand 4: Min assist;With armrests;From chair/3-in-1   Sit to Stand Details Tactile cues for sequencing;Verbal cues for technique;Tactile cues for posture;Tactile cues for weight shifting;Manual facilitation for weight shifting;Manual facilitation for placement   Stand to Sit 4: Min assist   Stand to Sit Details (indicate cue type and reason) Manual facilitation for weight shifting;Tactile cues for weight shifting;Verbal cues for technique;Tactile cues for sequencing;Tactile cues for initiation;Verbal cues for sequencing;Manual facilitation for weight bearing     Neuro Re-ed    Neuro Re-ed Details  NMR in standing to address postural  control-improved trunk extension, forward protraction of hips, lateral weight shifting and improved self correction of LOB.  Did this with side stepping task.  Had pt stand at wall holding large red physioball against wall at mid back level.  maintained standing in this manner x 3-4 mins before progressing to side stepping task.  Pt cued to maintain contact with ball while side stepping.  Note that we eventually moved away from ball, but pt did remarkably well maintaining upright posture and while she initially required up to mod A for LOB to the R, pt progressed to being able to self correct this and maintain standing at min  A to min/guard level.  Pt demonstrates marked improvement in weight shift both R and L, but do note more difficulty to the R, esp while trying to maintain forward hip protraction.  Performed 8' in each direction very slowly.                  PT Education - 05/01/16 [phone]    Education provided Yes   Education Details Working to let pt figure out balance/alignment in standing rather than just manually correcting   Person(s) Educated Patient;Spouse;Caregiver(s)   Methods Explanation;Demonstration   Comprehension Verbalized understanding          PT Short Term Goals - 05/01/16 0946      PT SHORT TERM GOAL #1   Title Pt will consistently perform supine rolling in B directions at S level (with rails as needed) in order to indicate improved trunk dissociation and increaed independence with bed mobility.  (Target Date for STGs: 05/28/16)   Time 4   Period Weeks   Status New     PT SHORT TERM GOAL #2   Title Pt will consistently perform R SL>sit at mod A level in order to indicate increased independence with getting out of bed.     Time 4   Period Weeks   Status New     PT SHORT TERM GOAL #3   Title Pt will consistently perform L SL>sit at min A level in order to indicate increased independene with getting out of bed/off of exercise mat table.    Time 4   Period Weeks   Status New     PT SHORT TERM GOAL #4   Title Pt will consistently perform sit to SL R and L at mod A level in order to indicate improved independence with bed mobility.     Time 4   Period Weeks     PT SHORT TERM GOAL #5   Title Pt will perform transfers from w/c to seated surface at min/guard level in order to indicate improved independence with transfers.    Time 4   Period Weeks   Status New     PT SHORT TERM GOAL #6   Title Pt will perform static standing with BUE support x 3 mins at S level with no overt LOB to decrease burden of care and increase independence with ADLs.    Time 4   Period Weeks    Status New     PT SHORT TERM GOAL #7   Title Pt will perform dynamic sitting at [facility] with intermittent UE support, reaching outside of BOS x 5 mins in order to indicate safety with ADLs.    Time 4   Period Weeks   Status New           PT Long Term Goals - 05/01/16 6213  PT LONG TERM GOAL #1   Title Pt will perform supine rolling R and L at mod I level in order to indicate improved independence with bed mobility.  (Target Date: 06/25/16)   Time 8   Period Weeks   Status New     PT LONG TERM GOAL #2   Title Pt will perform sit>SL R or L at min A level in order to indicate improved independence with getting into bed.    Time 8   Period Weeks   Status New     PT LONG TERM GOAL #3   Title Pt will perform SL R to sit at min A level, SL L to sit at S level in order to indicate increased independence with getting out of bed.     Time 8   Period Weeks   Status New     PT LONG TERM GOAL #4   Title Pt will perform transfers at S level 50% of the time in order to indicate improved functional independence.    Time 8   Period Weeks   Status New     PT LONG TERM GOAL #5   Title Pt will be able to stand with single UE support while performing opposite UE task x 3 mins at S level in order to indicate improved independence with ADLs.    Time 8   Period Weeks   Status New     Additional Long Term Goals   Additional Long Term Goals Yes     PT LONG TERM GOAL #6   Title Pt will demonstrate ability to ambulate up to 25' w/ LRAD at min A level in order to assist with getting around home.     Time 8   Period Weeks   Status New               Plan - 05/01/16 0945    Clinical Impression Statement Pt demonstrating marked improvement with postural control during session with standing side stepping task.  Requires min/guard at times, min A at heaviest to maintain balance.  Due to pts continued progress, will update POC for another 8 weeks at 2x/wk.  See updated goals.     Rehab  Potential Fair   Clinical Impairments Affecting Rehab Potential Chronicity of condition, continued medical management, but has very strong family and financial support   PT Frequency 2x / week   PT Duration 8 weeks   PT Treatment/Interventions ADLs/Self Care Home Management;DME Instruction;Gait training;Stair training;Functional mobility training;Therapeutic activities;Patient/family education;Cognitive remediation;Neuromuscular re-education;Therapeutic exercise;Balance training;Orthotic Fit/Training;Wheelchair mobility training;Manual techniques;Splinting;Visual/perceptual remediation/compensation;Vestibular;Passive range of motion;Electrical Stimulation   PT Next Visit Plan bed mobility, cont. transfer training, gait and balance training; activities to fascilitate Lt trunk elongation   Consulted and Agree with Plan of Care Patient;Family member/caregiver   Family Member Consulted caregiver, [name] and husband [name]      Patient will benefit from skilled therapeutic intervention in order to improve the following deficits and impairments:  Abnormal gait, Decreased balance, Decreased endurance, Decreased cognition, Cardiopulmonary status limiting activity, Decreased activity tolerance, Decreased coordination, Decreased strength, Impaired flexibility, Impaired tone, Decreased mobility, Increased muscle spasms, Impaired vision/preception, Decreased range of motion, Impaired UE functional use, Postural dysfunction, Improper body mechanics, Impaired perceived functional ability, Decreased knowledge of precautions, Decreased knowledge of use of DME  Visit Diagnosis: Unsteadiness on feet - Plan: PT plan of care cert/re-cert  Muscle weakness (generalized) - Plan: PT plan of care cert/re-cert  Other symptoms and signs involving the nervous system -  Plan: PT plan of care cert/re-cert  Other abnormalities of gait and mobility - Plan: PT plan of care cert/re-cert  Other lack of coordination - Plan: PT plan of  care cert/re-cert     Problem List Patient Active Problem List   Diagnosis Date Noted  . Heterotopic ossification of bone 03/10/2016  . Acute on chronic respiratory failure with hypoxia ([place])   . Acute hypoxemic respiratory failure ([place])   . Acute blood loss anemia   . Atelectasis 02/04/2016  . Debility 02/03/2016  . Pneumonia of both lower lobes due to Pseudomonas species ([place])   . Pneumonia of both lower lobes due to methicillin susceptible Staphylococcus aureus (MSSA) ([place])   . Acute respiratory failure with hypoxia ([place])   . Acute encephalopathy   . Seizures ([place])   . Sepsis ([place])   . Hypoxia   . Pain   . HCAP (healthcare-associated pneumonia) 12/28/2015  . Chronic respiratory failure ([place]) 12/28/2015  . Acute tracheobronchitis 12/24/2015  . Tracheostomy tube present ([place])   . Tracheal stenosis   . Chronic respiratory failure with hypoxia ([place])   . 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[place]) 10/20/2015  . Tracheostomy status ([place]) 09/26/2015  . Allergic rhinitis 09/26/2015  . 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 Movement disorder 09/22/2015  . Encephalitis 09/22/2015  . Chest pain 02/07/2014  . Premature atrial contractions 01/13/2014  . Abnormality of gait 12/04/2013  . Hyperlipidemia 12/21/2011  . Cardiovascular risk factor 12/21/2011  . Bunion 01/31/2008  . Metatarsalgia of both feet 09/20/2007  . FLAT FOOT 09/20/2007  . HALLUX RIGIDUS, ACQUIRED 09/20/2007    Cameron Sprang, PT, MPT [facility] [address] [place], [place], [address] Phone: [phone]   Fax:  [phone] 05/01/16, 10:02 AM  Name: Judy Spears MRN: [id number] Date of Birth: 06-05-1950

## 2016-05-03 ENCOUNTER — Encounter: Payer: Self-pay | Admitting: Occupational Therapy

## 2016-05-03 ENCOUNTER — Ambulatory Visit: Payer: BC Managed Care – PPO | Admitting: Occupational Therapy

## 2016-05-03 ENCOUNTER — Ambulatory Visit: Payer: BC Managed Care – PPO | Admitting: Physical Therapy

## 2016-05-03 DIAGNOSIS — M25612 Stiffness of left shoulder, not elsewhere classified: Secondary | ICD-10-CM

## 2016-05-03 DIAGNOSIS — M25611 Stiffness of right shoulder, not elsewhere classified: Secondary | ICD-10-CM

## 2016-05-03 DIAGNOSIS — M6281 Muscle weakness (generalized): Secondary | ICD-10-CM

## 2016-05-03 DIAGNOSIS — R2681 Unsteadiness on feet: Secondary | ICD-10-CM

## 2016-05-03 DIAGNOSIS — M25622 Stiffness of left elbow, not elsewhere classified: Secondary | ICD-10-CM

## 2016-05-03 DIAGNOSIS — R2689 Other abnormalities of gait and mobility: Secondary | ICD-10-CM

## 2016-05-03 DIAGNOSIS — R41844 Frontal lobe and executive function deficit: Secondary | ICD-10-CM

## 2016-05-03 DIAGNOSIS — R29818 Other symptoms and signs involving the nervous system: Secondary | ICD-10-CM

## 2016-05-03 DIAGNOSIS — R293 Abnormal posture: Secondary | ICD-10-CM

## 2016-05-03 DIAGNOSIS — R278 Other lack of coordination: Secondary | ICD-10-CM

## 2016-05-03 NOTE — Therapy (Signed)
[facility] [address] [place], [place], [address] Phone: [phone]   Fax:  [phone]  Physical Therapy Treatment  Patient Details  Name: Judy Spears MRN: [id number] Date of Birth: [birth year] Referring Provider: Alger Simons, MD/Andrew Letta Pate, MD  Encounter Date: 05/03/2016      PT End of Session - 05/03/16 1706    Visit Number 19   Number of Visits 36   Date for PT Re-Evaluation 06/29/16   Authorization Type BCBS   PT Start Time 1287   PT Stop Time 1532   PT Time Calculation (min) 45 min      Past Medical History:  Diagnosis Date  . Arthritis    lt great toe  . Complication of anesthesia    pt has had headaches post op-did advise to hydra well preop  . Hair loss    right-sided  . History of exercise stress test    a. ETT (12/15) with 12:00 exercise, no ST changes, occasional PACs.   . Hyperlipidemia   . Insomnia   . Migraine headache   . 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 Postmenopausal   . Premature atrial contractions    a. Holter (12/15) with 8% PACs, no atrial runs or atrial fibrillation.     Past Surgical History:  Procedure Laterality Date  . BREAST BIOPSY  01/01/2011   Procedure: BREAST BIOPSY WITH NEEDLE LOCALIZATION;  Surgeon: Rolm Bookbinder, MD;  Location: [facility];  Service: General;  Laterality: Left;  left breast wire localization biopsy  . cataracts right eye    . CESAREAN SECTION  1986  . HERNIA REPAIR  2000   RIH  . opirectinal membrane peel    . RHINOPLASTY  1983    There were no vitals filed for this visit.      Subjective Assessment - 05/03/16 1657    Subjective Caregiver reports she has been restless today - did not sleep well last night   Patient is accompained by: Family member   Pertinent History Precautions:  PEG.  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  Another round of botox in LLE on 02/06/16   Limitations House hold activities;Walking;Standing;Sitting   Patient Stated Goals Per husband, "For [name] to be as independent as possible."   Currently in Pain? No/denies                         [facility] Adult PT Treatment/Exercise - 05/03/16 1658      Transfers   Transfers Sit to Stand;Stand to [employer] Transfers   Sit to Stand 4: Min assist;With armrests;From chair/3-in-1   Sit to Stand Details Tactile cues for placement;Verbal cues for technique;Manual facilitation for weight shifting;Verbal cues for gait pattern   Stand to Sit 4: Min assist   Stand to Sit Details (indicate cue type and reason) Verbal cues for technique;Tactile cues for placement     Ambulation/Gait   Ambulation/Gait Yes   Ambulation/Gait Assistance 3: Mod assist;4: Min assist  +2  assist prn   Ambulation/Gait Assistance Details Posterior RW initially used - pt gait trained approx. 20' with +2 mod to min assist:  Platforms added to posterior RW to fascilitate more upright posture; mirror used for visual feedback  Ambulation Distance (Feet) 20 Feet  63' with platforms added to posterior RW   Assistive device Other (Comment)  bil. platform posterior RW   Gait Pattern Decreased stance time - right;Decreased hip/knee flexion - right;Decreased hip/knee flexion - left;Decreased weight shift to right;Scissoring;Ataxic   Ambulation Surface Level;Indoor                  PT Short Term Goals - 05/01/16 0946      PT SHORT TERM GOAL #1   Title Pt will consistently perform supine rolling in B directions at S level (with rails as needed) in order to indicate improved trunk dissociation and increaed independence with bed mobility.  (Target Date for STGs: 05/28/16)   Time 4   Period Weeks   Status New     PT SHORT  TERM GOAL #2   Title Pt will consistently perform R SL>sit at mod A level in order to indicate increased independence with getting out of bed.     Time 4   Period Weeks   Status New     PT SHORT TERM GOAL #3   Title Pt will consistently perform L SL>sit at min A level in order to indicate increased independene with getting out of bed/off of exercise mat table.    Time 4   Period Weeks   Status New     PT SHORT TERM GOAL #4   Title Pt will consistently perform sit to SL R and L at mod A level in order to indicate improved independence with bed mobility.     Time 4   Period Weeks     PT SHORT TERM GOAL #5   Title Pt will perform transfers from w/c to seated surface at min/guard level in order to indicate improved independence with transfers.    Time 4   Period Weeks   Status New     PT SHORT TERM GOAL #6   Title Pt will perform static standing with BUE support x 3 mins at S level with no overt LOB to decrease burden of care and increase independence with ADLs.    Time 4   Period Weeks   Status New     PT SHORT TERM GOAL #7   Title Pt will perform dynamic sitting at [facility] with intermittent UE support, reaching outside of BOS x 5 mins in order to indicate safety with ADLs.    Time 4   Period Weeks   Status New           PT Long Term Goals - 05/01/16 0953      PT LONG TERM GOAL #1   Title Pt will perform supine rolling R and L at mod I level in order to indicate improved independence with bed mobility.  (Target Date: 06/25/16)   Time 8   Period Weeks   Status New     PT LONG TERM GOAL #2   Title Pt will perform sit>SL R or L at min A level in order to indicate improved independence with getting into bed.    Time 8   Period Weeks   Status New     PT LONG TERM GOAL #3   Title Pt will perform SL R to sit at min A level, SL L to sit at S level in order to indicate increased independence with getting out of bed.     Time 8   Period Weeks   Status New     PT LONG TERM  GOAL #4   Title Pt will perform transfers at S level 50% of the time in order to indicate improved functional independence.    Time 8   Period Weeks   Status New     PT LONG TERM GOAL #5   Title Pt will be able to stand with single UE support while performing opposite UE task x 3 mins at S level in order to indicate improved independence with ADLs.    Time 8   Period Weeks   Status New     Additional Long Term Goals   Additional Long Term Goals Yes     PT LONG TERM GOAL #6   Title Pt will demonstrate ability to ambulate up to 25' w/ LRAD at min A level in order to assist with getting around home.     Time 8   Period Weeks   Status New               Plan - 05/03/16 1707    Clinical Impression Statement Pt's posture much improved with use of bilateral platforms on posterior RW compared to without use of platforms:  pt continues to lean anteriorly and has difficulty standing upright.; when manually placed in erect position in standing pt reports feeling as if she is falling.  Mirror significantly helped to improve standing posture by providing visual feedback. RLE noted to scissor more today during gait training, but this improved with cues to take smaller step and place foot out to side.   Rehab Potential Fair   Clinical Impairments Affecting Rehab Potential Chronicity of condition, continued medical management, but has very strong family and financial support   PT Frequency 2x / week   PT Duration 8 weeks   PT Treatment/Interventions ADLs/Self Care Home Management;DME Instruction;Gait training;Stair training;Functional mobility training;Therapeutic activities;Patient/family education;Cognitive remediation;Neuromuscular re-education;Therapeutic exercise;Balance training;Orthotic Fit/Training;Wheelchair mobility training;Manual techniques;Splinting;Visual/perceptual remediation/compensation;Vestibular;Passive range of motion;Electrical Stimulation   PT Next Visit Plan Vendor Doug  Cobb, [employer] from [place] to bring [place] for trial (this device is more supportive and has chest harness so that pt will feel more supported and require less physical assistance)   Consulted and Agree with Plan of Care Patient;Family member/caregiver   Family Member Consulted caregiver, [name] and husband [name]      Patient will benefit from skilled therapeutic intervention in order to improve the following deficits and impairments:  Abnormal gait, Decreased balance, Decreased endurance, Decreased cognition, Cardiopulmonary status limiting activity, Decreased activity tolerance, Decreased coordination, Decreased strength, Impaired flexibility, Impaired tone, Decreased mobility, Increased muscle spasms, Impaired vision/preception, Decreased range of motion, Impaired UE functional use, Postural dysfunction, Improper body mechanics, Impaired perceived functional ability, Decreased knowledge of precautions, Decreased knowledge of use of DME  Visit Diagnosis: Other abnormalities of gait and mobility     Problem List Patient Active Problem List   Diagnosis Date Noted  . Heterotopic ossification of bone 03/10/2016  . Acute on chronic respiratory failure with hypoxia ([facility])   . Acute hypoxemic respiratory failure ([place])   . Acute blood loss anemia   . Atelectasis 02/04/2016  . Debility 02/03/2016  . Pneumonia of both lower lobes due to Pseudomonas species ([place])   . Pneumonia of both lower lobes due to methicillin susceptible Staphylococcus aureus (MSSA) ([place])   . Acute respiratory failure with hypoxia ([place])   . Acute encephalopathy   . Seizures ([place])   . Sepsis ([place])   . Hypoxia   . Pain   . HCAP (healthcare-associated pneumonia) 12/28/2015  .  Chronic respiratory failure ([place]) 12/28/2015  . Acute tracheobronchitis 12/24/2015  . Tracheostomy tube present ([place])   . Tracheal stenosis   . Chronic respiratory failure with hypoxia ([place])   . 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[place]) 10/20/2015  .  Tracheostomy status ([place]) 09/26/2015  . Allergic rhinitis 09/26/2015  . 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 Movement disorder 09/22/2015  . Encephalitis 09/22/2015  . Chest pain 02/07/2014  . Premature atrial contractions 01/13/2014  . Abnormality of gait 12/04/2013  . Hyperlipidemia 12/21/2011  . Cardiovascular risk factor 12/21/2011  . Bunion 01/31/2008  . Metatarsalgia of both feet 09/20/2007  . FLAT FOOT 09/20/2007  . HALLUX RIGIDUS, ACQUIRED 09/20/2007    Alda Lea, PT 05/03/2016, 5:17 PM  [facility] [address] [place], [place], [address] Phone: [phone]   Fax:  [phone]  Name: CHRYSTLE MURILLO MRN: [id number] Date of Birth: [birth year]

## 2016-05-03 NOTE — Therapy (Signed)
[facility] [address] [place], [place], [address] Phone: [phone]   Fax:  [phone]  Occupational Therapy Treatment  Patient Details  Name: Judy Spears MRN: [id number] Date of Birth: Aug 10, 1950 Referring Provider: Dr. Quita Skye. [name]  Encounter Date: 05/03/2016      OT End of Session - 05/03/16 1705    Visit Number 18   Number of Visits 32   Date for OT Re-Evaluation 04/26/16   Authorization Type BCBS unlimited visits   OT Start Time 1660   OT Stop Time 1618   OT Time Calculation (min) 47 min   Activity Tolerance Patient tolerated treatment well      Past Medical History:  Diagnosis Date  . Arthritis    lt great toe  . Complication of anesthesia    pt has had headaches post op-did advise to hydra well preop  . Hair loss    right-sided  . History of exercise stress test    a. ETT (12/15) with 12:00 exercise, no ST changes, occasional PACs.   . Hyperlipidemia   . Insomnia   . Migraine headache   . 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 Postmenopausal   . Premature atrial contractions    a. Holter (12/15) with 8% PACs, no atrial runs or atrial fibrillation.     Past Surgical History:  Procedure Laterality Date  . BREAST BIOPSY  01/01/2011   Procedure: BREAST BIOPSY WITH NEEDLE LOCALIZATION;  Surgeon: Rolm Bookbinder, MD;  Location: [facility];  Service: General;  Laterality: Left;  left breast wire localization biopsy  . cataracts right eye    . CESAREAN SECTION  1986  . HERNIA REPAIR  2000   RIH  . opirectinal membrane peel    . RHINOPLASTY  1983    There were no vitals filed for this visit.      Subjective Assessment - 05/03/16 1700    Subjective  Yes when asked if she is comfortable in standard wheelchair   Patient is accompained by: Family member  husband, caregiver   Pertinent History see epic snapshot - ABI/encephalitis/hypoxia from virus; recent  hospitalization due to ARF;    Patient Stated Goals Pt unable to state.    Currently in Pain? No/denies                      OT Treatments/Exercises (OP) - 05/03/16 1701      Neurological Re-education Exercises   Other Exercises 1 Neuro re ed to address trunk control, squat pivot transfers to mat, sit to supine. Also addressed normal movement patterns in supine for mid reach using functional reaching task. Pt needs  mod facilitation to discourage shoulder hike, elbow extension, pronation and IR. Pt at times demonstrates inconsistent carry over and able to initiate and move through more normal movement pattern with only min facilitation.                    OT Short Term Goals - 05/03/16 1704      OT SHORT TERM GOAL #1   Title Pt, husband and cargiver will be mod I with HEP - 03/29/2016   Status Achieved     OT SHORT TERM GOAL #2   Title Pt will be able to sit with supervision without UE support and vc's to assist with midline 75% while engage in functional task   Status Achieved     OT SHORT TERM  GOAL #3   Title Pt will be able to complete sit to stand consistently with mod A for toilet transfers (max a for pivot)   Status Achieved     OT SHORT TERM GOAL #4   Title Pt, husband and caregiver will verbalize understanding of various bathroom accomodations to reduce long term caregiver burden   Status Achieved     OT SHORT TERM GOAL #5   Title Pt will be able to stand step transfer to toilet with mo more than mod a, mod vc's consistently using grab bar for community toileting needs- 05/27/2016   Status New     OT SHORT TERM GOAL #6   Title Pt will be able to wash face with supervision/max cues 50% of the time, min a 50% of the time.    Status New     OT SHORT TERM GOAL #7   Title Pt will be able to don pull over shirt with mod a, max cues   Status New     OT SHORT TERM GOAL #8   Title Pt will be able to maintain static standing with UE support with close  supervision to reduce burden of care for caregivers    Status New           OT Long Term Goals - 05/03/16 1704      OT LONG TERM GOAL #1   Title Pt, husband and caregiver will be mod I with upgraded HEP prn - 04/26/2016   Status Achieved     OT LONG TERM GOAL #2   Title Pt, husband and caregiver will be mod I with splint wear and care    Status Achieved     OT LONG TERM GOAL #3   Title Pt will be able to wash face with min a only   Status Achieved     OT LONG TERM GOAL #4   Title Pt will be able to sit without UE support with supevision 100% of the time with no more than 2 vc's while engaged in functional activity.   Status Achieved     OT LONG TERM GOAL #5   Title Pt, husband and caregiver will verbalize understanding of potential after care therapeutic opportunities. - 06/24/2016   Status On-going     OT LONG TERM GOAL #6   Title Pt will be supervision to wash face consistently   Status New     OT LONG TERM GOAL #7   Title Pt will don shirt with min a and max vc's 50% of the time   Status New     OT LONG TERM GOAL #8   Title Pt will require min a for stand step transfers to toilet using grab bar for community toileting at least 75% of the time.   Status New     OT LONG TERM GOAL  #9   Baseline Pt will stand with contact gurard with only 1 UE support for static standing balance in prep for abiilty to engage in functional task while standing.    Status New               Plan - 05/03/16 1704    Clinical Impression Statement Pt progressing toward goals. As of today, pt using standard wheelchair with Judy Spears back (vs tilt in space) and tolerating well.    Rehab Potential Fair   Clinical Impairments Affecting Rehab Potential severity of deficits, very slow rate of recovery   OT Frequency 2x / week  OT Duration 8 weeks   OT Treatment/Interventions Self-care/ADL training;Ultrasound;Moist Heat;Electrical Stimulation;DME and/or AE instruction;Neuromuscular  education;Therapeutic exercise;Functional Mobility Training;Manual Therapy;Passive range of motion;Splinting;Therapeutic activities;Balance training;Patient/family education;Visual/perceptual remediation/compensation;Cognitive remediation/compensation   Plan NMR for functional use of UE's, static to dynamic standing balance, postural control, NMR for trunk contorl, functional mobility   Consulted and Agree with Plan of Care Patient;Family member/caregiver   Family Member Consulted caregiver Judy Spears, husband Judy Spears       Patient will benefit from skilled therapeutic intervention in order to improve the following deficits and impairments:  Decreased endurance, Decreased skin integrity, Impaired vision/preception, Improper body mechanics, Impaired perceived functional ability, Decreased knowledge of precautions, Cardiopulmonary status limiting activity, Decreased activity tolerance, Decreased knowledge of use of DME, Decreased strength, Impaired flexibility, Improper spinal/pelvic alignment, Impaired sensation, Difficulty walking, Decreased mobility, Decreased balance, Decreased cognition, Decreased range of motion, Impaired tone, Impaired UE functional use, Decreased safety awareness, Decreased coordination, Pain  Visit Diagnosis: Unsteadiness on feet  Abnormal posture  Muscle weakness (generalized)  Other symptoms and signs involving the nervous system  Stiffness of right shoulder, not elsewhere classified  Stiffness of left shoulder, not elsewhere classified  Stiffness of left elbow, not elsewhere classified  Frontal lobe and executive function deficit  Other lack of coordination    Problem List Patient Active Problem List   Diagnosis Date Noted  . Heterotopic ossification of bone 03/10/2016  . Acute on chronic respiratory failure with hypoxia ([place])   . Acute hypoxemic respiratory failure ([place])   . Acute blood loss anemia   . Atelectasis 02/04/2016  . Debility 02/03/2016  .  Pneumonia of both lower lobes due to Pseudomonas species ([place])   . Pneumonia of both lower lobes due to methicillin susceptible Staphylococcus aureus (MSSA) ([place])   . Acute respiratory failure with hypoxia ([place])   . Acute encephalopathy   . Seizures ([place])   . Sepsis ([place])   . Hypoxia   . Pain   . HCAP (healthcare-associated pneumonia) 12/28/2015  . Chronic respiratory failure ([place]) 12/28/2015  . Acute tracheobronchitis 12/24/2015  . Tracheostomy tube present ([place])   . Tracheal stenosis   . Chronic respiratory failure with hypoxia ([place])   . 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 Tracheostomy status ([place]) 09/26/2015  . Allergic rhinitis 09/26/2015  . 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 Movement disorder 09/22/2015  . Encephalitis 09/22/2015  . Chest pain 02/07/2014  . Premature atrial contractions 01/13/2014  . Abnormality of gait 12/04/2013  . Hyperlipidemia 12/21/2011  . Cardiovascular risk factor 12/21/2011  . Bunion 01/31/2008  . Metatarsalgia of both feet 09/20/2007  . FLAT FOOT 09/20/2007  . HALLUX RIGIDUS, ACQUIRED 09/20/2007    Quay Burow, OTR/L 05/03/2016, 5:07 PM  [facility] [address] [place], [place], [address] Phone: [phone]   Fax:  [phone]  Name: Judy Spears MRN: [id number] Date of Birth: Jun 18, 1950

## 2016-05-04 ENCOUNTER — Other Ambulatory Visit: Payer: Self-pay

## 2016-05-04 ENCOUNTER — Telehealth: Payer: Self-pay | Admitting: Physical Medicine & Rehabilitation

## 2016-05-04 ENCOUNTER — Other Ambulatory Visit: Payer: Self-pay | Admitting: *Deleted

## 2016-05-04 MED ORDER — POTASSIUM CHLORIDE 20 MEQ/15ML (10%) PO SOLN: 20.0000 meq | Freq: Every day | ORAL | 0 refills | Status: DC

## 2016-05-04 NOTE — Telephone Encounter (Signed)
Audrea Muscat with pharmacy needs to get a refill on potassium chloride for patient.  Please call pharmacy.

## 2016-05-04 NOTE — Telephone Encounter (Signed)
Do you want to refill?  I dont see BMP since January.

## 2016-05-05 ENCOUNTER — Ambulatory Visit: Payer: BC Managed Care – PPO

## 2016-05-05 ENCOUNTER — Ambulatory Visit (HOSPITAL_COMMUNITY)
Admit: 2016-05-05 | Discharge: 2016-05-05 | Disposition: A | Discharge status: Home / Self Care | Payer: BC Managed Care – PPO | Attending: Acute Care | Admitting: Acute Care

## 2016-05-05 DIAGNOSIS — R471 Dysarthria and anarthria: Secondary | ICD-10-CM

## 2016-05-05 DIAGNOSIS — J9690 Respiratory failure, unspecified, unspecified whether with hypoxia or hypercapnia: Secondary | ICD-10-CM | POA: Insufficient documentation

## 2016-05-05 DIAGNOSIS — R1312 Dysphagia, oropharyngeal phase: Secondary | ICD-10-CM

## 2016-05-05 DIAGNOSIS — Z430 Encounter for attention to tracheostomy: Secondary | ICD-10-CM

## 2016-05-05 DIAGNOSIS — R41841 Cognitive communication deficit: Secondary | ICD-10-CM

## 2016-05-05 DIAGNOSIS — R2681 Unsteadiness on feet: Secondary | ICD-10-CM | POA: Diagnosis not present

## 2016-05-05 MED ORDER — POTASSIUM CHLORIDE 20 MEQ/15ML (10%) PO SOLN: 20.0000 meq | Freq: Every day | ORAL | 5 refills | Status: DC

## 2016-05-05 NOTE — Progress Notes (Signed)
Subjective:  ROV trach care   Patient ID: Judy Spears, female    DOB: 1950/09/11, 66 y.o.   MRN: [id number]  HPI Judy Spears returns today with her husband and sister for routine trach care follow up and also with hopes of decannulation. The last time I saw Judy Spears was  Back in January this year after she was hospitalized for initially a Pneumonia but then this was complicated by what we inititialy though was refractory status so we sent her to [facility]. During that admit she still had stoma so she we re-trach'd on admission for mechanical ventilation. She was eventually returned to [facility] acute rehab with what was felt to be stimulus induced rhythmic periodic discharges. She has since been home and has been progressing well. She has been capped for  Months, required no suctioning, tolerating diet and doing well phonation. It was the hope of the family that we could take the trach out.    Review of Systems  Constitutional:       Increasing activity Increasing po intake Excellent activity tolerance   HENT: Negative.   Eyes: Negative.   Respiratory: Negative.   Cardiovascular: Negative.   Gastrointestinal: Negative.   Endocrine: Negative.   Genitourinary: Negative.   Musculoskeletal: Negative.   Allergic/Immunologic: Negative.   Neurological: Negative.   Hematological: Negative.   Psychiatric/Behavioral: Negative.   VS- BP 134/91, HR-95, RR-20, SpO2-100% on RA with trach capped  Objective:   Physical Exam  Constitutional: She appears well-nourished. No distress.  HENT:  Head: Normocephalic and atraumatic.  Mouth/Throat: Oropharynx is clear and moist.  Eyes: Conjunctivae are normal. Pupils are equal, round, and reactive to light. Right eye exhibits no discharge. Left eye exhibits no discharge. No scleral icterus.  Neck: Normal range of motion. Neck supple.  Trach stoma is unremarkable    Cardiovascular: Normal rate.  Exam reveals no gallop.   No murmur heard. Pulmonary/Chest: Effort normal and  breath sounds normal. No respiratory distress. She has no wheezes. She has no rales.  Musculoskeletal: Normal range of motion.  Neurological: She is alert. She displays abnormal reflex. She exhibits abnormal muscle tone. Coordination abnormal.  Skin: Skin is warm and dry. No rash noted. She is not diaphoretic. No erythema.   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 Strong cough, eating, doing well. We had discussion about risk of recurrent respiratory failure. Offered leaving trach given history. Husband (Judy Spears) wanted to give decannulation another try based now that winter was over. We also discussed the fact that her trach stoma had not healed last time. We agreed to take this one day at a time but I would likely need to refer to ENT should it not close  Plan Keep occlusive dressing ROV 6-8 weeks if stoma not closed.   (addendum 4/12)  Spoke w/ Dr Judy Spears. He was concerned about Judy Spears's ability to cough now that stoma was open. He felt that without occlusion of the stoma Judy Spears's ability to generate strong enough cough may be at risk.   We discussed a plan to get her back to be seen sooner and I actually went ahead and spoke to Dr Judy Spears who was kind enough to agree to see her if needed. In the mean time we also ordered an occlusive dressing w/ an HME unit usually used for Laryngectomy that may allow  [place] to help her generate stronger cough.   Judy Spears ACNP-BC [facility] Pager # [phone] OR # [phone] if no answer

## 2016-05-05 NOTE — Progress Notes (Signed)
Tracheostomy Procedure Note  Judy Spears [id number] May 06, 1950  Pre Procedure Tracheostomy Information  Trach Brand: Shiley Size: 4 Style: Uncuffed & capped Secured by: Velcro  VS- BP 134/91, HR-95, RR-20, SpO2-100% on RA with trach capped  Procedure: decannulation    Post Procedure Tracheostomy Information  Pt decannulated & dressed with 4x4 gauze   Post Procedure Evaluation  Vital signs:blood pressure 133/88, pulse 96, respirations 20 and pulse oximetry 100% on RA Patients current condition: stable Complications: No apparent complications Trach site exam: clean, dry Wound care done: 4 x 4 gauze Patient did tolerate procedure well.   Education: Do not submerge stoma below water. Call back if stoma has not closed up in 8 weeks

## 2016-05-05 NOTE — Addendum Note (Signed)
Addended by: Alger Simons T on: 05/05/2016 05:14 PM   Modules accepted: Orders

## 2016-05-05 NOTE — Telephone Encounter (Signed)
Medication ordered per verbal readback by Dr. [name]

## 2016-05-06 ENCOUNTER — Emergency Department (HOSPITAL_COMMUNITY): Payer: BC Managed Care – PPO

## 2016-05-06 ENCOUNTER — Telehealth: Payer: Self-pay

## 2016-05-06 ENCOUNTER — Encounter (HOSPITAL_COMMUNITY): Payer: Self-pay

## 2016-05-06 ENCOUNTER — Inpatient Hospital Stay (HOSPITAL_COMMUNITY)
Admission: EM | Admit: 2016-05-06 | Discharge: 2016-05-11 | DRG: 871 | Disposition: A | Discharge status: Home / Self Care | Payer: BC Managed Care – PPO | Attending: Pulmonary Disease | Admitting: Pulmonary Disease

## 2016-05-06 DIAGNOSIS — R509 Fever, unspecified: Secondary | ICD-10-CM | POA: Diagnosis present

## 2016-05-06 DIAGNOSIS — I10 Essential (primary) hypertension: Secondary | ICD-10-CM | POA: Diagnosis present

## 2016-05-06 DIAGNOSIS — A419 Sepsis, unspecified organism: Principal | ICD-10-CM | POA: Diagnosis present

## 2016-05-06 DIAGNOSIS — E785 Hyperlipidemia, unspecified: Secondary | ICD-10-CM | POA: Diagnosis present

## 2016-05-06 DIAGNOSIS — Z681 Body mass index (BMI) 19 or less, adult: Secondary | ICD-10-CM | POA: Diagnosis not present

## 2016-05-06 DIAGNOSIS — B941 Sequelae of viral encephalitis: Secondary | ICD-10-CM | POA: Diagnosis not present

## 2016-05-06 DIAGNOSIS — Z79899 Other long term (current) drug therapy: Secondary | ICD-10-CM | POA: Diagnosis not present

## 2016-05-06 DIAGNOSIS — G9349 Other encephalopathy: Secondary | ICD-10-CM | POA: Diagnosis present

## 2016-05-06 DIAGNOSIS — Z7401 Bed confinement status: Secondary | ICD-10-CM | POA: Diagnosis not present

## 2016-05-06 DIAGNOSIS — Z91048 Other nonmedicinal substance allergy status: Secondary | ICD-10-CM | POA: Diagnosis not present

## 2016-05-06 DIAGNOSIS — Z8661 Personal history of infections of the central nervous system: Secondary | ICD-10-CM | POA: Diagnosis not present

## 2016-05-06 DIAGNOSIS — J9601 Acute respiratory failure with hypoxia: Secondary | ICD-10-CM | POA: Diagnosis present

## 2016-05-06 DIAGNOSIS — G8250 Quadriplegia, unspecified: Secondary | ICD-10-CM | POA: Diagnosis present

## 2016-05-06 DIAGNOSIS — A86 Unspecified viral encephalitis: Secondary | ICD-10-CM | POA: Diagnosis not present

## 2016-05-06 DIAGNOSIS — J189 Pneumonia, unspecified organism: Secondary | ICD-10-CM | POA: Diagnosis present

## 2016-05-06 DIAGNOSIS — R739 Hyperglycemia, unspecified: Secondary | ICD-10-CM | POA: Diagnosis present

## 2016-05-06 DIAGNOSIS — R1310 Dysphagia, unspecified: Secondary | ICD-10-CM | POA: Diagnosis present

## 2016-05-06 DIAGNOSIS — R29898 Other symptoms and signs involving the musculoskeletal system: Secondary | ICD-10-CM | POA: Diagnosis not present

## 2016-05-06 DIAGNOSIS — I5030 Unspecified diastolic (congestive) heart failure: Secondary | ICD-10-CM | POA: Diagnosis not present

## 2016-05-06 DIAGNOSIS — J9509 Other tracheostomy complication: Secondary | ICD-10-CM | POA: Diagnosis present

## 2016-05-06 DIAGNOSIS — Y95 Nosocomial condition: Secondary | ICD-10-CM | POA: Diagnosis present

## 2016-05-06 DIAGNOSIS — Z7951 Long term (current) use of inhaled steroids: Secondary | ICD-10-CM | POA: Diagnosis not present

## 2016-05-06 DIAGNOSIS — E440 Moderate protein-calorie malnutrition: Secondary | ICD-10-CM | POA: Diagnosis present

## 2016-05-06 DIAGNOSIS — R0602 Shortness of breath: Secondary | ICD-10-CM | POA: Diagnosis not present

## 2016-05-06 DIAGNOSIS — Z881 Allergy status to other antibiotic agents status: Secondary | ICD-10-CM | POA: Diagnosis not present

## 2016-05-06 DIAGNOSIS — E876 Hypokalemia: Secondary | ICD-10-CM | POA: Diagnosis present

## 2016-05-06 DIAGNOSIS — Z8249 Family history of ischemic heart disease and other diseases of the circulatory system: Secondary | ICD-10-CM | POA: Diagnosis not present

## 2016-05-06 DIAGNOSIS — R64 Cachexia: Secondary | ICD-10-CM | POA: Diagnosis present

## 2016-05-06 DIAGNOSIS — E46 Unspecified protein-calorie malnutrition: Secondary | ICD-10-CM | POA: Diagnosis present

## 2016-05-06 DIAGNOSIS — J9621 Acute and chronic respiratory failure with hypoxia: Secondary | ICD-10-CM

## 2016-05-06 DIAGNOSIS — G9340 Encephalopathy, unspecified: Secondary | ICD-10-CM | POA: Diagnosis not present

## 2016-05-06 DIAGNOSIS — R4189 Other symptoms and signs involving cognitive functions and awareness: Secondary | ICD-10-CM | POA: Diagnosis present

## 2016-05-06 DIAGNOSIS — J40 Bronchitis, not specified as acute or chronic: Secondary | ICD-10-CM | POA: Diagnosis not present

## 2016-05-06 LAB — CBC WITH DIFFERENTIAL/PLATELET
BASOS ABS: 0 10*3/uL (ref 0.0–0.1)
Basophils Relative: 0 %
Eosinophils Absolute: 0 10*3/uL (ref 0.0–0.7)
Eosinophils Relative: 0 %
HEMATOCRIT: 38.7 % (ref 36.0–46.0)
Hemoglobin: 12.9 g/dL (ref 12.0–15.0)
LYMPHS ABS: 0.7 10*3/uL (ref 0.7–4.0)
LYMPHS PCT: 4 %
MCH: 29.9 pg (ref 26.0–34.0)
MCHC: 33.3 g/dL (ref 30.0–36.0)
MCV: 89.8 fL (ref 78.0–100.0)
MONO ABS: 0.7 10*3/uL (ref 0.1–1.0)
MONOS PCT: 4 %
Neutro Abs: 17.4 10*3/uL — ABNORMAL HIGH (ref 1.7–7.7)
Neutrophils Relative %: 92 %
Platelets: 251 10*3/uL (ref 150–400)
RBC: 4.31 MIL/uL (ref 3.87–5.11)
RDW: 12.9 % (ref 11.5–15.5)
WBC: 18.8 10*3/uL — ABNORMAL HIGH (ref 4.0–10.5)

## 2016-05-06 LAB — COMPREHENSIVE METABOLIC PANEL
ALT: 80 U/L — ABNORMAL HIGH (ref 14–54)
ANION GAP: 10 (ref 5–15)
AST: 36 U/L (ref 15–41)
Albumin: 3.7 g/dL (ref 3.5–5.0)
Alkaline Phosphatase: 101 U/L (ref 38–126)
BUN: 12 mg/dL (ref 6–20)
CHLORIDE: 96 mmol/L — AB (ref 101–111)
CO2: 26 mmol/L (ref 22–32)
Calcium: 9.4 mg/dL (ref 8.9–10.3)
Creatinine, Ser: 0.49 mg/dL (ref 0.44–1.00)
GFR calc Af Amer: >60 mL/min (ref >60)
GFR calc non Af Amer: >60 mL/min (ref >60)
GLUCOSE: 121 mg/dL — AB (ref 65–99)
POTASSIUM: 3.5 mmol/L (ref 3.5–5.1)
SODIUM: 132 mmol/L — AB (ref 135–145)
TOTAL PROTEIN: 6.5 g/dL (ref 6.5–8.1)
Total Bilirubin: 2 mg/dL — ABNORMAL HIGH (ref 0.3–1.2)

## 2016-05-06 LAB — I-STAT ARTERIAL BLOOD GAS, ED
ACID-BASE EXCESS: 2 mmol/L (ref 0.0–2.0)
Bicarbonate: 25.7 mmol/L (ref 20.0–28.0)
O2 Saturation: 93 %
TCO2: 27 mmol/L (ref 0–100)
pCO2 arterial: 37 mmHg (ref 32.0–48.0)
pH, Arterial: 7.452 — ABNORMAL HIGH (ref 7.350–7.450)
pO2, Arterial: 65 mmHg — ABNORMAL LOW (ref 83.0–108.0)

## 2016-05-06 LAB — GLUCOSE, CAPILLARY: Glucose-Capillary: 120 mg/dL — ABNORMAL HIGH (ref 65–99)

## 2016-05-06 LAB — I-STAT CG4 LACTIC ACID, ED: LACTIC ACID, VENOUS: 1.35 mmol/L (ref 0.5–1.9)

## 2016-05-06 LAB — PROCALCITONIN: PROCALCITONIN: 0.18 ng/mL

## 2016-05-06 MED ORDER — ACETAMINOPHEN 160 MG/5ML PO SOLN
650.0000 mg | Freq: Once | ORAL | Status: AC
  Administered 2016-05-06: 650 mg via ORAL
  Filled 2016-05-06: qty 20.3

## 2016-05-06 MED ORDER — ENOXAPARIN SODIUM 40 MG/0.4ML ~~LOC~~ SOLN
40.0000 mg | SUBCUTANEOUS | Status: DC
  Administered 2016-05-06 – 2016-05-10 (×5): 40 mg via SUBCUTANEOUS
  Filled 2016-05-06 (×6): qty 0.4

## 2016-05-06 MED ORDER — SODIUM CHLORIDE 0.9 % IV BOLUS (SEPSIS)
500.0000 mL | Freq: Once | INTRAVENOUS | Status: AC
  Administered 2016-05-06: 500 mL via INTRAVENOUS

## 2016-05-06 MED ORDER — FAMOTIDINE IN NACL 20-0.9 MG/50ML-% IV SOLN
20.0000 mg | Freq: Two times a day (BID) | INTRAVENOUS | Status: DC
  Administered 2016-05-06 – 2016-05-09 (×6): 20 mg via INTRAVENOUS
  Filled 2016-05-06 (×6): qty 50

## 2016-05-06 MED ORDER — DEXTROSE 5 % IV SOLN
1.0000 g | Freq: Three times a day (TID) | INTRAVENOUS | Status: DC
  Administered 2016-05-07 (×2): 1 g via INTRAVENOUS
  Filled 2016-05-06 (×2): qty 1

## 2016-05-06 MED ORDER — SODIUM CHLORIDE 0.9 % IV BOLUS (SEPSIS)
250.0000 mL | Freq: Once | INTRAVENOUS | Status: AC
  Administered 2016-05-06: 250 mL via INTRAVENOUS

## 2016-05-06 MED ORDER — SODIUM CHLORIDE 0.9 % IV SOLN: 250.0000 mL | INTRAVENOUS | Status: DC | PRN

## 2016-05-06 MED ORDER — ORAL CARE MOUTH RINSE
15.0000 mL | Freq: Two times a day (BID) | OROMUCOSAL | Status: DC
  Administered 2016-05-07 – 2016-05-10 (×7): 15 mL via OROMUCOSAL

## 2016-05-06 MED ORDER — VANCOMYCIN HCL 500 MG IV SOLR
500.0000 mg | Freq: Two times a day (BID) | INTRAVENOUS | Status: DC
  Administered 2016-05-07 – 2016-05-09 (×5): 500 mg via INTRAVENOUS
  Filled 2016-05-06 (×5): qty 500

## 2016-05-06 MED ORDER — PIPERACILLIN-TAZOBACTAM 3.375 G IVPB 30 MIN
3.3750 g | Freq: Once | INTRAVENOUS | Status: AC
Start: 2016-05-06 — End: 2016-05-06
  Administered 2016-05-06: 3.375 g via INTRAVENOUS
  Filled 2016-05-06: qty 50

## 2016-05-06 MED ORDER — SODIUM CHLORIDE 0.9 % IV BOLUS (SEPSIS)
1000.0000 mL | Freq: Once | INTRAVENOUS | Status: AC
  Administered 2016-05-06: 1000 mL via INTRAVENOUS

## 2016-05-06 MED ORDER — VANCOMYCIN HCL IN DEXTROSE 1-5 GM/200ML-% IV SOLN
1000.0000 mg | Freq: Once | INTRAVENOUS | Status: AC
  Administered 2016-05-06: 1000 mg via INTRAVENOUS
  Filled 2016-05-06: qty 200

## 2016-05-06 MED ORDER — SODIUM CHLORIDE 0.9 % IV SOLN
INTRAVENOUS | Status: DC
  Administered 2016-05-06 – 2016-05-08 (×2): via INTRAVENOUS

## 2016-05-06 MED ORDER — IPRATROPIUM-ALBUTEROL 0.5-2.5 (3) MG/3ML IN SOLN: 3.0000 mL | Freq: Once | RESPIRATORY_TRACT | Status: DC

## 2016-05-06 NOTE — Telephone Encounter (Signed)
-----   Message from Juanito Doom, MD sent at 05/05/2016  5:17 PM EDT ----- A, Can you get her in to see me next week?  She was decannulated today and I'd like to see her a little sooner to keep an eye on her. Thanks, Ruby Cola

## 2016-05-06 NOTE — ED Notes (Signed)
RT to bedside

## 2016-05-06 NOTE — H&P (Signed)
[name]   Name: Judy Spears MRN: [id number] DOB: 11-26-1950    ADMISSION DATE:  05/06/2016 CONSULTATION DATE:  4/12  REFERRING MD:  Dr. 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 She was recently admitted in December 2017 with acute respiratory failure secondary to Pseudomonas and MSSA pneumonia. At that point she had been decannulated from her tracheostomy, however she required repeat mechanical ventilation and tracheostomy placement. She had improved and underwent rehabilitation for several months. 4/11 she was seen in the trach clinic and had been doing very well and had been for several months, therefore her tracheostomy was removed. On 4/12 she presented to [facility] emergency department with complaints of fever, purulent mucus production, and cough for about 4-6 hours. In the emergency department she was started on supplemental oxygen requiring 2 L to maintain sats in the low 90s. She was also started on broad-spectrum antibiotics including Zosyn and vancomycin. Chest x-ray largely reflected known fibrotic changes Laboratory evaluation was significant for WBC 18.8. PCCM has been asked to see for admission.  PAST MEDICAL HISTORY :  She  has a past medical history of Arthritis; Complication of anesthesia; Hair loss; History of exercise stress test; Hyperlipidemia; Insomnia; Migraine headache; Movement disorder; Postmenopausal; and Premature atrial contractions.  PAST SURGICAL HISTORY: She  has a past surgical history that includes Hernia repair (2000); Rhinoplasty (1983); Cesarean section (1986); Breast biopsy (01/01/2011); opirectinal membrane peel; and cataracts right eye.  Allergies  Allergen  Reactions  . Pollen Extract Other (See Comments)    Running nose  . Tetracyclines & Related     Blisters, photosensitivity    No current facility-administered medications on file prior to encounter.    Current Outpatient Prescriptions on File Prior to Encounter  Medication Sig  . amantadine (SYMMETREL) 50 MG/5ML solution Place 15 mLs (150 mg total) into feeding tube 2 (two) times daily. In am and at noon  . amLODipine (NORVASC) 5 MG tablet Place 1 tablet (5 mg total) into feeding tube daily.  [name] atorvastatin (LIPITOR) 20 MG tablet Place 20 mg into feeding tube daily.  . baclofen (LIORESAL) 10 MG tablet Place 1 tablet (10 mg total) into feeding tube every 6 (six) hours.  . chlorhexidine gluconate, MEDLINE KIT, (PERIDEX) 0.12 % solution 15 mLs by Mouth Rinse route 2 (two) times daily.  . diazepam (VALIUM) 2 MG tablet Place 1 tablet (2 mg total) into feeding tube at bedtime.  . docusate (COLACE) 60 MG/15ML syrup Place 60 mg into feeding tube daily.  . fluticasone (FLONASE) 50 MCG/ACT nasal spray Place 1 spray into both nostrils 2 (two) times daily.  [name] guaiFENesin (ROBITUSSIN) 100 MG/5ML SOLN Place 10 mLs (200 mg total) into feeding tube 4 (four) times daily.  . Hydrocortisone (GERHARDT'S BUTT CREAM) CREA Apply 1 application topically 4 (four) times daily.  [name] loratadine (CLARITIN) 10 MG tablet Place 1 tablet (10 mg total) into feeding tube daily as needed for allergies.  . Melatonin 3 MG TABS Place 6 mg into feeding tube every evening.  . mouth rinse LIQD solution 15 mLs by Mouth Rinse route QID.  [name] naphazoline-glycerin (CLEAR EYES) 0.012-0.2 % SOLN Place 1-2 drops into both eyes 4 (four) times daily -  with meals and at bedtime.  [name]  nystatin (MYCOSTATIN/NYSTOP) powder Apply 1 g topically 2 (two) times daily as needed.  . potassium chloride 20 MEQ/15ML (10%) SOLN Place 15 mLs (20 mEq total) into feeding tube daily.  . prochlorperazine (COMPAZINE) 5 MG tablet Place 1-2 tablets (5-10 mg total)  into feeding tube every 6 (six) hours as needed for nausea.  [name] saccharomyces boulardii (FLORASTOR) 250 MG capsule Open capsule and place contents into feeding tube twice a day  . simethicone (MYLICON) 40 OQ/9.4TM drops Place 0.6 mLs (40 mg total) into feeding tube 4 (four) times daily as needed for flatulence.  . valACYclovir (VALTREX) 500 MG tablet Place 500 mg into feeding tube daily.    FAMILY HISTORY:  Her indicated that her mother is deceased. She indicated that her father is alive. She indicated that both of her brothers are alive. She indicated that the status of her maternal grandfather is unknown. She indicated that both of her daughters are alive. She indicated that her son is alive. She indicated that the status of her maternal uncle is unknown.    SOCIAL HISTORY: She  reports that she has never smoked. She has never used smokeless tobacco. She reports that she drinks alcohol. She reports that she does not use drugs.  REVIEW OF SYSTEMS:  Obtained from family. Bolds are positive  Constitutional: weight loss, gain, night sweats, Fevers, chills, fatigue .  HEENT: headaches, Sore throat, sneezing, nasal congestion, post nasal drip, Difficulty swallowing, Tooth/dental problems, visual complaints visual changes, ear ache CV:  chest pain, radiates:,Orthopnea, PND, swelling in lower extremities, dizziness, palpitations, syncope.  GI  heartburn, indigestion, abdominal pain, nausea, vomiting, diarrhea, change in bowel habits, loss of appetite, bloody stools.  Resp: cough, productive: yellow sputum , hemoptysis, dyspnea, chest pain, pleuritic.  Skin: rash or itching or icterus GU: dysuria, change in color of urine, urgency or frequency. flank pain, hematuria  MS: joint pain or swelling. decreased range of motion  Psych: change in mood or affect. depression or anxiety.  Neuro: difficulty with speech, weakness, numbness, ataxia    SUBJECTIVE:    VITAL SIGNS: BP 135/81   Pulse 86   Temp  99.9 F (37.7 C) (Oral)   Resp (!) 37   Ht 5' 5" (1.651 m)   Wt 49.9 kg (110 lb)   LMP 12/01/2010   SpO2 94%   BMI 18.30 kg/m   HEMODYNAMICS:    VENTILATOR SETTINGS:    INTAKE / OUTPUT: No intake/output data recorded.  PHYSICAL EXAMINATION: General:  Cachectic female in NAD Neuro:  Spontaneously awake, alert, non-verbal at baseline. HEENT:  [place]/AT, PERRL, no JVD. Trach stoma with occlusive dressing in place Cardiovascular:  RRR, no MRG Lungs:  Tachypnea rate 30s to 40s. Rhoncherous breath sounds L >R. Dark yellow secretions from stoma. Abdomen:  Soft, non-tender, non-distended Musculoskeletal:  No acute deformity Skin:  Grossly intact.   LABS:  BMET  Recent Labs Lab 05/06/16 1800  NA 132*  K 3.5  CL 96*  CO2 26  BUN 12  CREATININE 0.49  GLUCOSE 121*    Electrolytes  Recent Labs Lab 05/06/16 1800  CALCIUM 9.4    CBC  Recent Labs Lab 05/06/16 1800  WBC 18.8*  HGB 12.9  HCT 38.7  PLT 251    Coag's No results for input(s): APTT, INR in the last 168 hours.  Sepsis Markers  Recent Labs Lab 05/06/16 1827  LATICACIDVEN 1.35    ABG No results for input(s): PHART, PCO2ART, PO2ART in the last 168 hours.  Liver  Enzymes  Recent Labs Lab 05/06/16 1800  AST 36  ALT 80*  ALKPHOS 101  BILITOT 2.0*  ALBUMIN 3.7    Cardiac Enzymes No results for input(s): TROPONINI, PROBNP in the last 168 hours.  Glucose No results for input(s): GLUCAP in the last 168 hours.  Imaging Dg Chest Port 1 View  Result Date: 05/06/2016 CLINICAL DATA:  Cough. EXAM: PORTABLE CHEST 1 VIEW COMPARISON:  02/19/2016 FINDINGS: The cardiac silhouette, mediastinal and hilar contours are within normal limits and stable. Stable tortuosity of the thoracic aorta. Chronic right basilar opacity since December 2017 most consistent with post pneumonic scarring change. Low lung volumes with vascular crowding and bibasilar atelectasis. No definite acute infiltrates, pulmonary edema  or pleural effusion. IMPRESSION: Chronic right basilar scarring type changes. No definite acute overlying pulmonary process. Electronically Signed   By: Marijo Sanes M.D.   On: 05/06/2016 18:35     STUDIES:    CULTURES: Blood 4/12 > Sputum 4/12 >  ANTIBIOTICS: Cefepime 4/12 > Vancomycin 4/12 >  SIGNIFICANT EVENTS:   LINES/TUBES:   DISCUSSION:   ASSESSMENT / PLAN:  Acute hypoxemic respiratory failure: likely due to HCAP vs Tracheobronchitis -Supplemental O2 to keep sats > 90% -Intubation risk -IS and flutter valve if able -Chest PT  Sepsis secondary to suspected HCAP - ABX as above - Follow cultures - Trend PCT  Nutrition - will hold tube feeds with possibility of intubation tonight. 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

## 2016-05-06 NOTE — ED Provider Notes (Signed)
[facility] DEPT Provider Note   CSN: [id number] Arrival date & time:        History   Chief Complaint Chief Complaint  Patient presents with  . Code Sepsis    as per family pt developed a fever this afternoon pt had her trach removed yesterday     HPI Judy Spears is a 66 y.o. female.  Level V caveat for urgent need for intervention. Complex patient with multiple health problems secondary to a diagnosis of viral encephalopathy 18 months ago presents with fever and rhonchorous cough for the past 12-24 hours. Her tracheostomy tube was discontinued yesterday. Vital signs are stable.      Past Medical History:  Diagnosis Date  . Arthritis    lt great toe  . Complication of anesthesia    pt has had headaches post op-did advise to hydra well preop  . Hair loss    right-sided  . History of exercise stress test    a. ETT (12/15) with 12:00 exercise, no ST changes, occasional PACs.   . Hyperlipidemia   . Insomnia   . Migraine headache   . 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 Postmenopausal   . Premature atrial contractions    a. Holter (12/15) with 8% PACs, no atrial runs or atrial fibrillation.     Patient Active Problem List   Diagnosis Date Noted  . Heterotopic ossification of bone 03/10/2016  . Acute on chronic respiratory failure with hypoxia ([place])   . Acute hypoxemic respiratory failure ([place])   . Acute blood loss anemia   . Atelectasis 02/04/2016  . Debility 02/03/2016  . Pneumonia of both lower lobes due to Pseudomonas species ([place])   . Pneumonia of both lower lobes due to methicillin susceptible Staphylococcus aureus (MSSA) ([place])   . Acute respiratory failure with hypoxia ([place])   . Acute encephalopathy   . Seizures ([place])   . Sepsis ([place])   . Hypoxia   . Pain   . HCAP (healthcare-associated pneumonia) 12/28/2015  . Chronic respiratory failure ([place]) 12/28/2015  . Acute tracheobronchitis 12/24/2015  . Tracheostomy tube present ([place])   .  Tracheal stenosis   . Chronic respiratory failure with hypoxia ([place])   . 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 Tracheostomy status ([place]) 09/26/2015  . Allergic rhinitis 09/26/2015  . 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 Movement disorder 09/22/2015  . Encephalitis 09/22/2015  . Chest pain 02/07/2014  . Premature atrial contractions 01/13/2014  . Abnormality of gait 12/04/2013  . Hyperlipidemia 12/21/2011  . Cardiovascular risk factor 12/21/2011  . Bunion 01/31/2008  . Metatarsalgia of both feet 09/20/2007  . FLAT FOOT 09/20/2007  . HALLUX RIGIDUS, ACQUIRED 09/20/2007    Past Surgical History:  Procedure Laterality Date  . BREAST BIOPSY  01/01/2011   Procedure: BREAST BIOPSY WITH NEEDLE LOCALIZATION;  Surgeon: Rolm Bookbinder, MD;  Location: [facility];  Service: General;  Laterality: Left;  left breast wire localization biopsy  . cataracts right eye    . CESAREAN SECTION  1986  . HERNIA REPAIR  2000   [facility]  . opirectinal membrane peel    . RHINOPLASTY  1983    OB History    No data available       Home Medications    Prior to Admission medications   Medication Sig Start Date End Date Taking? Authorizing Provider  amantadine (SYMMETREL) 50 MG/5ML solution Place 15 mLs (150 mg total) into feeding tube 2 (two) times daily.  In am and at noon 02/25/16   Ivan Anchors Love, PA-C  amLODipine (NORVASC) 5 MG tablet Place 1 tablet (5 mg total) into feeding tube daily. 03/16/16   Meredith Staggers, MD  atorvastatin (LIPITOR) 20 MG tablet Place 20 mg into feeding tube daily.    Historical Provider, MD  baclofen (LIORESAL) 10 MG tablet Place 1 tablet (10 mg total) into feeding tube every 6 (six) hours. 03/29/16   Meredith Staggers, MD  chlorhexidine gluconate, MEDLINE KIT, (PERIDEX) 0.12 % solution 15 mLs by Mouth Rinse route 2 (two) times daily. 01/04/16   Cherene Altes, MD  diazepam (VALIUM) 2 MG tablet Place 1 tablet (2 mg total) into feeding tube at bedtime. 03/22/16    Meredith Staggers, MD  docusate (COLACE) 60 MG/15ML syrup Place 60 mg into feeding tube daily.    Historical Provider, MD  fluticasone (FLONASE) 50 MCG/ACT nasal spray Place 1 spray into both nostrils 2 (two) times daily.    Historical Provider, MD  guaiFENesin (ROBITUSSIN) 100 MG/5ML SOLN Place 10 mLs (200 mg total) into feeding tube 4 (four) times daily. 02/25/16   Bary Leriche, PA-C  Hydrocortisone (GERHARDT'S BUTT CREAM) CREA Apply 1 application topically 4 (four) times daily. 02/25/16   Bary Leriche, PA-C  loratadine (CLARITIN) 10 MG tablet Place 1 tablet (10 mg total) into feeding tube daily as needed for allergies. 02/25/16   Bary Leriche, PA-C  Melatonin 3 MG TABS Place 6 mg into feeding tube every evening.    Historical Provider, MD  mouth rinse LIQD solution 15 mLs by Mouth Rinse route QID. 01/05/16   Cherene Altes, MD  naphazoline-glycerin (CLEAR EYES) 0.012-0.2 % SOLN Place 1-2 drops into both eyes 4 (four) times daily -  with meals and at bedtime. 02/25/16   Ivan Anchors Love, PA-C  nystatin (MYCOSTATIN/NYSTOP) powder Apply 1 g topically 2 (two) times daily as needed. 02/25/16   Bary Leriche, PA-C  potassium chloride 20 MEQ/15ML (10%) SOLN Place 15 mLs (20 mEq total) into feeding tube daily. 05/05/16   Meredith Staggers, MD  prochlorperazine (COMPAZINE) 5 MG tablet Place 1-2 tablets (5-10 mg total) into feeding tube every 6 (six) hours as needed for nausea. 02/25/16   Bary Leriche, PA-C  saccharomyces boulardii (FLORASTOR) 250 MG capsule Open capsule and place contents into feeding tube twice a day 02/25/16   Ivan Anchors Love, PA-C  simethicone (MYLICON) 40 TY/6.0AY drops Place 0.6 mLs (40 mg total) into feeding tube 4 (four) times daily as needed for flatulence. 02/25/16   Bary Leriche, PA-C  valACYclovir (VALTREX) 500 MG tablet Place 500 mg into feeding tube daily.    Historical Provider, MD    Family History Family History  Problem Relation Age of Onset  . Stroke Mother     paralyzed on  right side/aphasia  . Hypertension Mother     had stroke after stopping BP meds  . Colon cancer Father   . Prostate cancer Father   . Heart attack Maternal Grandfather 56  . Stroke Maternal Uncle 70    Social History Social History  Substance Use Topics  . Smoking status: Never Smoker  . Smokeless tobacco: Never Used  . Alcohol use 0.0 oz/week     Comment: Occasional.     Allergies   Pollen extract and Tetracyclines & related   Review of Systems Review of Systems  Reason unable to perform ROS: urgent need for intervention.  Physical Exam Updated Vital Signs BP 135/81   Pulse 86   Temp 99.9 F (37.7 C) (Oral)   Resp (!) 37   Ht _0  (1.651 m)   Wt 110 lb (49.9 kg)   LMP 12/01/2010   SpO2 94%   BMI 18.30 kg/m   Physical Exam  Constitutional:  Slight tachypnea but no gross respiratory distress. Tracheostomy tube is covered with a dressing  HENT:  Head: Normocephalic and atraumatic.  Eyes: Conjunctivae are normal.  Neck: Neck supple.  Cardiovascular: Normal rate and regular rhythm.   Pulmonary/Chest:  Bilateral rhonchi  Abdominal: Soft. Bowel sounds are normal.  Musculoskeletal: Normal range of motion.  Neurological: She is alert.  Skin: Skin is warm and dry.  Psychiatric:  Flat affect  Nursing note and vitals reviewed.    ED Treatments / Results  Labs (all labs ordered are listed, but only abnormal results are displayed) Labs Reviewed  COMPREHENSIVE METABOLIC PANEL - Abnormal; Notable for the following:       Result Value   Sodium 132 (*)    Chloride 96 (*)    Glucose, Bld 121 (*)    ALT 80 (*)    Total Bilirubin 2.0 (*)    All other components within normal limits  CBC WITH DIFFERENTIAL/PLATELET - Abnormal; Notable for the following:    WBC 18.8 (*)    Neutro Abs 17.4 (*)    All other components within normal limits  CULTURE, BLOOD (ROUTINE X 2)  CULTURE, BLOOD (ROUTINE X 2)  URINALYSIS, ROUTINE W REFLEX MICROSCOPIC  I-STAT CG4  LACTIC ACID, ED    EKG  EKG Interpretation None       Radiology Dg Chest Port 1 View  Result Date: 05/06/2016 CLINICAL DATA:  Cough. EXAM: PORTABLE CHEST 1 VIEW COMPARISON:  02/19/2016 FINDINGS: The cardiac silhouette, mediastinal and hilar contours are within normal limits and stable. Stable tortuosity of the thoracic aorta. Chronic right basilar opacity since December 2017 most consistent with post pneumonic scarring change. Low lung volumes with vascular crowding and bibasilar atelectasis. No definite acute infiltrates, pulmonary edema or pleural effusion. IMPRESSION: Chronic right basilar scarring type changes. No definite acute overlying pulmonary process. Electronically Signed   By: Marijo Sanes M.D.   On: 05/06/2016 18:35    Procedures Procedures (including critical care time)  Medications Ordered in ED Medications  sodium chloride 0.9 % bolus 1,000 mL (1,000 mLs Intravenous New Bag/Given 05/06/16 1824)    And  sodium chloride 0.9 % bolus 500 mL (not administered)    And  sodium chloride 0.9 % bolus 250 mL (not administered)  vancomycin (VANCOCIN) 500 mg in sodium chloride 0.9 % 100 mL IVPB (not administered)  ceFEPIme (MAXIPIME) 1 g in dextrose 5 % 50 mL IVPB (not administered)  vancomycin (VANCOCIN) IVPB 1000 mg/200 mL premix (1,000 mg Intravenous New Bag/Given 05/06/16 1830)  piperacillin-tazobactam (ZOSYN) IVPB 3.375 g (3.375 g Intravenous New Bag/Given 05/06/16 1831)  acetaminophen (TYLENOL) solution 650 mg (650 mg Oral Given 05/06/16 1844)     Initial Impression / Assessment and Plan / ED Course  I have reviewed the triage vital signs and the nursing notes.  Pertinent labs & imaging results that were available during my care of the patient were reviewed by me and considered in my medical decision making (see chart for details).    CRITICAL CARE Performed by: Nat Christen Total critical care time: 30 minutes Critical care time was exclusive of separately billable  procedures and treating other patients. Critical  care was necessary to treat or prevent imminent or life-threatening deterioration. Critical care was time spent personally by me on the following activities: development of treatment plan with patient and/or surrogate as well as nursing, discussions with consultants, evaluation of patient's response to treatment, examination of patient, obtaining history from patient or surrogate, ordering and performing treatments and interventions, ordering and review of laboratory studies, ordering and review of radiographic studies, pulse oximetry and re-evaluation of patient's condition.  I'm concerned about the possibility of pneumonia. Code sepsis initiated. Admit to critical care.  Final Clinical Impressions(s) / ED Diagnoses   Final diagnoses:  Fever, unspecified fever cause    New Prescriptions New Prescriptions   No medications on file     Nat Christen, MD 05/10/16 1301

## 2016-05-06 NOTE — Progress Notes (Signed)
Pt decannulated yesterday.  Site is clean with fresh bandage that was applied by family member.  Pt is currently on 2L N/C, not showing signs of distress and has normal WOB.  Ronchi heard in both upper lobes and diminished in lower.  No wheezing noted.  RN at bedside.  Will continue to monitor.

## 2016-05-06 NOTE — Therapy (Signed)
[facility] [address] [place], [place], [address] Phone: [phone]   Fax:  [phone]  Speech Language Pathology Treatment  Patient Details  Name: Judy Spears MRN: [id number] Date of Birth: [birth year] Referring Provider: Charlett Blake, MD  Encounter Date: 05/05/2016      End of Session - 05/06/16 1520    Visit Number 19   Number of Visits 33   Date for SLP Re-Evaluation 06/25/16   SLP Start Time 0932   SLP Stop Time  1015   SLP Time Calculation (min) 43 min   Activity Tolerance Patient tolerated treatment well      Past Medical History:  Diagnosis Date  . Arthritis    lt great toe  . Complication of anesthesia    pt has had headaches post op-did advise to hydra well preop  . Hair loss    right-sided  . History of exercise stress test    a. ETT (12/15) with 12:00 exercise, no ST changes, occasional PACs.   . Hyperlipidemia   . Insomnia   . Migraine headache   . 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 Postmenopausal   . Premature atrial contractions    a. Holter (12/15) with 8% PACs, no atrial runs or atrial fibrillation.     Past Surgical History:  Procedure Laterality Date  . BREAST BIOPSY  01/01/2011   Procedure: BREAST BIOPSY WITH NEEDLE LOCALIZATION;  Surgeon: Rolm Bookbinder, MD;  Location: [facility];  Service: General;  Laterality: Left;  left breast wire localization biopsy  . cataracts right eye    . CESAREAN SECTION  1986  . HERNIA REPAIR  2000   RIH  . opirectinal membrane peel    . RHINOPLASTY  1983    There were no vitals filed for this visit.      Subjective Assessment - 05/06/16 1513    Subjective Inaudible greeting to SLP   Patient is accompained by: Family member   Special Tests spouse, [name]; caregiver [name]               ADULT SLP TREATMENT - 05/06/16 0001      General Information   Behavior/Cognition Alert;Cooperative;Pleasant  mood;Distractible;Lethargic     Treatment Provided   Treatment provided Cognitive-Linquistic     Cognitive-Linquistic Treatment   Treatment focused on Voice   Skilled Treatment Pt was repositioned during session for optimal breath support and voicing. Idiom completion between SLP and pt with voicing approx 30% of the time, and adeuqate breath 25% of the time with visual and verbal cues usually. SLP then simplified task for just loud /a/ upon full breath - SLP drew out and explained optimum timing for breathing/voicing coordination, which helped pt minimally. Pt's caregiver provided SLP with information of how she practiced full breath and appropriate voicing at home at this time (upon SLP request) and SLP used this procedure with common and practical phrasees/sentencnes. Voicing frequency was 80% with SLP consistent verbal and visual cues, and adequate breath support approx 60% using consistent mod-max visual cues.      Tracheostomy Shiley 4 mm Uncuffed   Tracheostomy Properties Placement Date: 02/24/16 Placement Time: [place] By: ICU physician Brand: Shiley Size (mm): 4 mm Style: Uncuffed     Assessment / Recommendations / Plan   Plan Continue with current plan of care     Dysphagia Recommendations   Diet recommendations Dysphagia 1 (puree);Thin liquid   Liquids provided via  Teaspoon;Cup   Medication Administration Via alternative means   Supervision Full supervision/cueing for compensatory strategies;Trained caregiver to feed patient   Compensations Effortful swallow;Multiple dry swallows after each bite/sip;Check for anterior loss;Small sips/bites;Slow rate   Postural Changes and/or Swallow Maneuvers Seated upright 90 degrees     Progression Toward Goals   Progression toward goals Progressing toward goals            SLP Short Term Goals - 05/06/16 1523      SLP SHORT TERM GOAL #1   Title Patient will utilize intelligibility strategies with moderate visual cues from  communication partner/visual aid to communicate 5-7 word wants, needs, thoughts and ideas.   Baseline 03/15/16, 04/05/16, 04/12/16, 04/19/16   Time 3   Period Weeks   Status Revised     SLP SHORT TERM GOAL #2   Title Patient will utilize intelligibility strategies, breath support with mod A for phrase repetition in 75% of trials.   Baseline 2.12.18, 04/05/16, 04/08/16, 04/12/16   Status Achieved     SLP SHORT TERM GOAL #3   Title Pt will swallow thin liquids, purees and soft solids in 9/10 trials with no overt signs of aspiration.   Baseline 2.12.18, 03-12-16, 03-15-16   Status Achieved     SLP SHORT TERM GOAL #4   Title Patient will tolerate 6 oz pleasure feeds of pureed solids and/or thin liquids, >95% with no overt signs of aspiration.   Baseline Husband reports pt ate "a whole bowl of soup" with no overt s/sx aspiration   Time 3   Period Weeks   Status Revised     SLP SHORT TERM GOAL #5   Title Patient will utilize verbal cues for lip closure, dry swallow for secretion management in 75% of opportunities.   Time 3   Period Weeks   Status On-going          SLP Long Term Goals - 05/06/16 1524      SLP LONG TERM GOAL #1   Title Patient will utilize multimodal communication/AAC to clarify wants, needs, thoughts or ideas during communication breakdowns with max A   Time    Period    Status Deferred     SLP LONG TERM GOAL #2   Title Patient will utilize intelligibility strategies, breath support with min A visual cues for 5-7 word responses with 80% accuracy.   Time 7   Period Weeks   Status On-going     SLP LONG TERM GOAL #3   Title Patient will demonstrate readiness for MBS to determine safety for pleasure feeds by timely initiation of swallow in 3/5 trials with ice chips/H20.   Baseline    Status Achieved     SLP LONG TERM GOAL #4   Title Patient will adequately masticate and clear soft solids with mod verbal cues and no overt signs of aspiration in 4/5 trials.   Time 7    Period Weeks   Status On-going     SLP LONG TERM GOAL #5   Title Patient will utilize visual cue for lip closure, dry swallow for secretion management in 75% of opportunities.   Time 7   Period Weeks   Status On-going          Plan - 05/06/16 1521    Clinical Impression Statement Patient remained alert and attentive with tactile cues from SLP occasionally (somnolent during small portion of session). Pt proved her need for external cuing for appropriate and adequate voicing as well as for  adequate breath support for spech. Recommended pt continue with liquids, purees only at this time, ice if requesting chewing sensation.  Monitor for overt signs of aspiration and cease feeding if observed. Recommend skilled ST in order to maximize functional communication, swallowing safety, reduce caregiver burden and improve quality of life.   Speech Therapy Frequency 2x / week   Duration Other (comment)   Treatment/Interventions Aspiration precaution training;Trials of upgraded texture/liquids;Compensatory strategies;Functional tasks;Patient/family education;Cueing hierarchy;Diet toleration management by SLP;Cognitive reorganization;Compensatory techniques;SLP instruction and feedback;Internal/external aids;Multimodal communcation approach   Potential to Achieve Goals Good   Potential Considerations Ability to learn/carryover information;Co-morbidities;Severity of impairments   SLP Home Exercise Plan comfort feeds purees/liquids only with oral care prior to and after feeding, functional and phrase level tasks for breath pacing   Consulted and Agree with Plan of Care Family member/caregiver;Patient   Family Member Consulted husband chip, caregiver lisa      Patient will benefit from skilled therapeutic intervention in order to improve the following deficits and impairments:   Dysarthria and anarthria  Dysphagia, oropharyngeal phase  Cognitive communication deficit    Problem List Patient  Active Problem List   Diagnosis Date Noted  . Heterotopic ossification of bone 03/10/2016  . Acute on chronic respiratory failure with hypoxia ([place])   . Acute hypoxemic respiratory failure ([place])   . Acute blood loss anemia   . Atelectasis 02/04/2016  . Debility 02/03/2016  . Pneumonia of both lower lobes due to Pseudomonas species ([place])   . Pneumonia of both lower lobes due to methicillin susceptible Staphylococcus aureus (MSSA) ([place])   . Acute respiratory failure with hypoxia ([place])   . Acute encephalopathy   . Seizures ([place])   . Sepsis ([place])   . Hypoxia   . Pain   . HCAP (healthcare-associated pneumonia) 12/28/2015  . Chronic respiratory failure ([place]) 12/28/2015  . Acute tracheobronchitis 12/24/2015  . Tracheostomy tube present ([place])   . Tracheal stenosis   . Chronic respiratory failure with hypoxia ([place])   . 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 Tracheostomy status ([place]) 09/26/2015  . Allergic rhinitis 09/26/2015  . 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 Movement disorder 09/22/2015  . Encephalitis 09/22/2015  . Chest pain 02/07/2014  . Premature atrial contractions 01/13/2014  . Abnormality of gait 12/04/2013  . Hyperlipidemia 12/21/2011  . Cardiovascular risk factor 12/21/2011  . Bunion 01/31/2008  . Metatarsalgia of both feet 09/20/2007  . FLAT FOOT 09/20/2007  . HALLUX RIGIDUS, ACQUIRED 09/20/2007    [facility] ,MS, CCC-SLP  05/06/2016, 3:25 PM  [facility] [address] [place], [place], [address] Phone: [phone]   Fax:  [phone]   Name: Judy Spears MRN: [id number] Date of Birth: 05/24/1950

## 2016-05-06 NOTE — ED Triage Notes (Signed)
Pt arrives from home with family began to have a fever last night ER MD to room for evaluation

## 2016-05-06 NOTE — Progress Notes (Signed)
Pharmacy Antibiotic Note  Judy Spears is a 66 y.o. female admitted on 05/06/2016 with sepsis.  Pharmacy has been consulted for vancomycin and cefepime dosing. Renal function wnl.  Vancomycin trough goal 15-20  Plan: 1) Vancomycin 1g IV x 1 then 500mg  IV q12 2) Cefepime 1g IV q8 3) Follow renal function, culture, LOT, level if needed  Height: 5\' 5"  (165.1 cm) Weight: 110 lb (49.9 kg) IBW/kg (Calculated) : 57  Temp (24hrs), Avg:99.9 F (37.7 C), Min:99.9 F (37.7 C), Max:99.9 F (37.7 C)   Recent Labs Lab 05/06/16 1800 05/06/16 1827  WBC 18.8*  --   CREATININE 0.49  --   LATICACIDVEN  --  1.35    Estimated Creatinine Clearance: 56 mL/min (by C-G formula based on SCr of 0.49 mg/dL).    Allergies  Allergen Reactions  . Pollen Extract Other (See Comments)    Running nose  . Tetracyclines & Related     Blisters, photosensitivity    Antimicrobials this admission: 4/12 Vancomycin >> 4/12 Zosyn x 1 >> 4/13 Cefepime >>  Dose adjustments this admission: n/a  Microbiology results: None yet  Thank you for allowing pharmacy to be a part of this patient's care.  Deboraha Sprang 05/06/2016 7:27 PM

## 2016-05-06 NOTE — Telephone Encounter (Signed)
lmtcb X1 for pt's spouse- pt can be scheduled on a 05/11/16 held spot on BQ's schedule.

## 2016-05-07 ENCOUNTER — Inpatient Hospital Stay (HOSPITAL_COMMUNITY): Payer: BC Managed Care – PPO

## 2016-05-07 ENCOUNTER — Other Ambulatory Visit (HOSPITAL_COMMUNITY): Payer: BC Managed Care – PPO

## 2016-05-07 ENCOUNTER — Ambulatory Visit: Payer: BC Managed Care – PPO

## 2016-05-07 ENCOUNTER — Encounter (HOSPITAL_COMMUNITY): Payer: BC Managed Care – PPO

## 2016-05-07 ENCOUNTER — Ambulatory Visit: Payer: BC Managed Care – PPO | Admitting: Rehabilitation

## 2016-05-07 ENCOUNTER — Ambulatory Visit: Payer: BC Managed Care – PPO | Admitting: Occupational Therapy

## 2016-05-07 DIAGNOSIS — J40 Bronchitis, not specified as acute or chronic: Secondary | ICD-10-CM

## 2016-05-07 LAB — BASIC METABOLIC PANEL
ANION GAP: 8 (ref 5–15)
BUN: 7 mg/dL (ref 6–20)
CALCIUM: 8.7 mg/dL — AB (ref 8.9–10.3)
CO2: 26 mmol/L (ref 22–32)
CREATININE: 0.38 mg/dL — AB (ref 0.44–1.00)
Chloride: 101 mmol/L (ref 101–111)
GLUCOSE: 113 mg/dL — AB (ref 65–99)
Potassium: 2.8 mmol/L — ABNORMAL LOW (ref 3.5–5.1)
SODIUM: 135 mmol/L (ref 135–145)

## 2016-05-07 LAB — MAGNESIUM
MAGNESIUM: 2.2 mg/dL (ref 1.7–2.4)
MAGNESIUM: 1.8 mg/dL (ref 1.7–2.4)
Magnesium: 1.5 mg/dL — ABNORMAL LOW (ref 1.7–2.4)

## 2016-05-07 LAB — CBC
HCT: 35.1 % — ABNORMAL LOW (ref 36.0–46.0)
HEMOGLOBIN: 11.4 g/dL — AB (ref 12.0–15.0)
MCH: 29.2 pg (ref 26.0–34.0)
MCHC: 32.5 g/dL (ref 30.0–36.0)
MCV: 89.8 fL (ref 78.0–100.0)
Platelets: 226 10*3/uL (ref 150–400)
RBC: 3.91 MIL/uL (ref 3.87–5.11)
RDW: 13 % (ref 11.5–15.5)
WBC: 18.8 10*3/uL — AB (ref 4.0–10.5)

## 2016-05-07 LAB — PHOSPHORUS
Phosphorus: 3.2 mg/dL (ref 2.5–4.6)
Phosphorus: 2.3 mg/dL — ABNORMAL LOW (ref 2.5–4.6)
Phosphorus: 2.2 mg/dL — ABNORMAL LOW (ref 2.5–4.6)

## 2016-05-07 LAB — GLUCOSE, CAPILLARY
GLUCOSE-CAPILLARY: 153 mg/dL — AB (ref 65–99)
Glucose-Capillary: 172 mg/dL — ABNORMAL HIGH (ref 65–99)

## 2016-05-07 LAB — TROPONIN I: TROPONIN I: 0.03 ng/mL — AB (ref <0.03)

## 2016-05-07 LAB — MRSA PCR SCREENING: MRSA by PCR: POSITIVE — AB

## 2016-05-07 MED ORDER — POTASSIUM CHLORIDE 20 MEQ/15ML (10%) PO SOLN
20.0000 meq | Freq: Every day | ORAL | Status: DC
  Administered 2016-05-07 – 2016-05-10 (×4): 20 meq
  Filled 2016-05-07 (×4): qty 15

## 2016-05-07 MED ORDER — VALACYCLOVIR HCL 500 MG PO TABS
500.0000 mg | ORAL_TABLET | ORAL | Status: DC
  Administered 2016-05-07 – 2016-05-11 (×5): 500 mg
  Filled 2016-05-07 (×5): qty 1

## 2016-05-07 MED ORDER — DOCUSATE SODIUM 50 MG/5ML PO LIQD
60.0000 mg | Freq: Every day | ORAL | Status: DC
  Administered 2016-05-07 – 2016-05-10 (×4): 60 mg
  Filled 2016-05-07 (×4): qty 10

## 2016-05-07 MED ORDER — GERHARDT'S BUTT CREAM
1.0000 "application " | TOPICAL_CREAM | Freq: Four times a day (QID) | CUTANEOUS | Status: DC
  Administered 2016-05-07 – 2016-05-11 (×17): 1 via TOPICAL
  Filled 2016-05-07: qty 1

## 2016-05-07 MED ORDER — SODIUM CHLORIDE 0.9 % IV SOLN
Freq: Once | INTRAVENOUS | Status: AC
  Administered 2016-05-07: 04:00:00 via INTRAVENOUS
  Filled 2016-05-07: qty 1000

## 2016-05-07 MED ORDER — PRO-STAT SUGAR FREE PO LIQD
30.0000 mL | Freq: Every day | ORAL | Status: DC
  Administered 2016-05-08 – 2016-05-10 (×3): 30 mL
  Filled 2016-05-07 (×3): qty 30

## 2016-05-07 MED ORDER — PIPERACILLIN-TAZOBACTAM 3.375 G IVPB
3.3750 g | Freq: Three times a day (TID) | INTRAVENOUS | Status: DC
  Filled 2016-05-07 (×2): qty 50

## 2016-05-07 MED ORDER — ATORVASTATIN CALCIUM 20 MG PO TABS
20.0000 mg | ORAL_TABLET | Freq: Every day | ORAL | Status: DC
  Administered 2016-05-07 – 2016-05-10 (×4): 20 mg
  Filled 2016-05-07 (×5): qty 1

## 2016-05-07 MED ORDER — AMLODIPINE BESYLATE 5 MG PO TABS
5.0000 mg | ORAL_TABLET | Freq: Every day | ORAL | Status: DC
  Administered 2016-05-07 – 2016-05-10 (×4): 5 mg
  Filled 2016-05-07 (×4): qty 1

## 2016-05-07 MED ORDER — OSMOLITE 1.5 CAL PO LIQD
1000.0000 mL | ORAL | Status: DC
  Administered 2016-05-07 – 2016-05-10 (×4): 1000 mL
  Filled 2016-05-07 (×11): qty 1000

## 2016-05-07 MED ORDER — CHLORHEXIDINE GLUCONATE CLOTH 2 % EX PADS
6.0000 | MEDICATED_PAD | Freq: Every day | CUTANEOUS | Status: DC
  Administered 2016-05-07 – 2016-05-10 (×4): 6 via TOPICAL

## 2016-05-07 MED ORDER — MELATONIN 3 MG PO TABS
6.0000 mg | ORAL_TABLET | Freq: Every evening | ORAL | Status: DC
  Administered 2016-05-07 – 2016-05-10 (×3): 6 mg
  Filled 2016-05-07 (×5): qty 2

## 2016-05-07 MED ORDER — DIAZEPAM 2 MG PO TABS
2.0000 mg | ORAL_TABLET | Freq: Every day | ORAL | Status: DC
  Administered 2016-05-07 – 2016-05-10 (×4): 2 mg
  Filled 2016-05-07 (×4): qty 1

## 2016-05-07 MED ORDER — FREE WATER
150.0000 mL | Status: DC
  Administered 2016-05-08 – 2016-05-09 (×4): 150 mL

## 2016-05-07 MED ORDER — PRO-STAT SUGAR FREE PO LIQD
30.0000 mL | Freq: Two times a day (BID) | ORAL | Status: DC
  Administered 2016-05-07: 30 mL
  Filled 2016-05-07 (×2): qty 30

## 2016-05-07 MED ORDER — AMANTADINE HCL 50 MG/5ML PO SYRP
150.0000 mg | ORAL_SOLUTION | Freq: Two times a day (BID) | ORAL | Status: DC
Start: 2016-05-07 — End: 2016-05-11
  Administered 2016-05-07 – 2016-05-10 (×8): 150 mg
  Filled 2016-05-07 (×10): qty 15

## 2016-05-07 MED ORDER — MAGNESIUM SULFATE 4 GM/100ML IV SOLN
4.0000 g | Freq: Once | INTRAVENOUS | Status: AC
  Administered 2016-05-07: 4 g via INTRAVENOUS
  Filled 2016-05-07: qty 100

## 2016-05-07 MED ORDER — MUPIROCIN 2 % EX OINT
1.0000 "application " | TOPICAL_OINTMENT | Freq: Two times a day (BID) | CUTANEOUS | Status: DC
  Administered 2016-05-07 – 2016-05-10 (×9): 1 via NASAL
  Filled 2016-05-07 (×3): qty 22

## 2016-05-07 MED ORDER — BACLOFEN 10 MG PO TABS
10.0000 mg | ORAL_TABLET | Freq: Four times a day (QID) | ORAL | Status: DC
Start: 2016-05-07 — End: 2016-05-11
  Administered 2016-05-07 – 2016-05-11 (×16): 10 mg
  Filled 2016-05-07 (×17): qty 1

## 2016-05-07 MED ORDER — RISAQUAD PO CAPS
1.0000 | ORAL_CAPSULE | Freq: Every day | ORAL | Status: DC
  Administered 2016-05-07 – 2016-05-10 (×4): 1 via ORAL
  Filled 2016-05-07 (×6): qty 1

## 2016-05-07 MED ORDER — VITAL HIGH PROTEIN PO LIQD
1000.0000 mL | ORAL | Status: DC
  Administered 2016-05-07: 1000 mL

## 2016-05-07 MED ORDER — COQ10 30 MG PO CAPS: ORAL_CAPSULE | Freq: Every day | ORAL | Status: DC

## 2016-05-07 MED ORDER — PIPERACILLIN-TAZOBACTAM 3.375 G IVPB
3.3750 g | Freq: Three times a day (TID) | INTRAVENOUS | Status: DC
  Administered 2016-05-07 – 2016-05-09 (×6): 3.375 g via INTRAVENOUS
  Filled 2016-05-07 (×7): qty 50

## 2016-05-07 NOTE — Progress Notes (Signed)
CPT held at this time. Pt is resting and sleeping comfortably.

## 2016-05-07 NOTE — Progress Notes (Signed)
[name]IT[facility]IC[facility]   Name: Judy Spears MRN: [id number] DOB: 1950-04-08    ADMISSION DATE:  05/06/2016 CONSULTATION DATE:  4/12  REFERRING MD:  Dr. 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   SUBJECTIVE:  Feeling a little better Still tachypneic Not uncomfortable  Some cough, mucus production  VITAL SIGNS: BP 107/73   Pulse 80   Temp 98.7 F (37.1 C) (Oral)   Resp (!) 33   Ht 5\' 5"  (1.651 m)   Wt 49.9 kg (110 lb 0.2 oz)   LMP 12/01/2010   SpO2 96%   BMI 18.31 kg/m   HEMODYNAMICS:    VENTILATOR SETTINGS:    INTAKE / OUTPUT: I/O last 3 completed shifts: In: 2218.8 [I.V.:618.8; IV Piggyback:1600] Out: -   PHYSICAL EXAMINATION: General:  Resting in bed, tachypnea but no accessory muscle use HEENT: Trach stoma patent, neck supple otherwise PULM: Rhonchi RLL, increased respiratory effort CV: RRR, no mgr Belly soft, nontender, feeding tube in place Derm: no breakdown Neuro: awake, speaks in one word sentences  LABS:  BMET  Recent Labs Lab 05/06/16 1800 05/07/16 0204  NA 132* 135  K 3.5 2.8*  CL 96* 101  CO2 26 26  BUN 12 7  CREATININE 0.49 0.38*  GLUCOSE 121* 113*    Electrolytes  Recent Labs Lab 05/06/16 1800 05/07/16 0204 05/07/16 0931  CALCIUM 9.4 8.7*  --   MG  --  1.5* 2.2  PHOS  --  3.2 2.3*    CBC  Recent Labs Lab 05/06/16 1800 05/07/16 0204  WBC 18.8* 18.8*  HGB 12.9 11.4*  HCT 38.7 35.1*  PLT 251 226    Coag's No results for input(s): APTT, INR in the last 168 hours.  Sepsis Markers  Recent Labs Lab 05/06/16 1800 05/06/16 1827  LATICACIDVEN  --  1.35  PROCALCITON 0.18  --     ABG  Recent Labs Lab 05/06/16 2028  PHART 7.452*  PCO2ART 37.0  PO2ART 65.0*    Liver  Enzymes  Recent Labs Lab 05/06/16 1800  AST 36  ALT 80*  ALKPHOS 101  BILITOT 2.0*  ALBUMIN 3.7    Cardiac Enzymes  Recent Labs Lab 05/07/16 0204  TROPONINI 0.03*    Glucose  Recent Labs Lab 05/06/16 2352  GLUCAP 120*    Imaging Dg Chest Port 1 View  Result Date: 05/07/2016 CLINICAL DATA:  Pneumonia. EXAM: PORTABLE CHEST 1 VIEW COMPARISON:  Radiograph of May 06, 2016. FINDINGS: Stable cardiomediastinal silhouette. No pneumothorax is noted. Minimal left basilar subsegmental atelectasis is noted. Increased right perihilar opacity is noted concerning for pneumonia or atelectasis. Right basilar atelectasis or scarring is again noted. No significant pleural effusion is noted. Bony thorax is unremarkable. IMPRESSION: Right basilar atelectasis or scarring is again noted. Increased right perihilar opacity is noted concerning for pneumonia or atelectasis. Electronically Signed   By: Marijo Conception, M.D.   On: 05/07/2016 07:34   Dg Chest Port 1 View  Result Date: 05/06/2016 CLINICAL DATA:  Cough. EXAM: PORTABLE CHEST 1 VIEW COMPARISON:  02/19/2016 FINDINGS: The cardiac silhouette, mediastinal and hilar contours are within normal limits and stable. Stable tortuosity of the thoracic aorta. Chronic right basilar opacity since December 2017 most consistent with post pneumonic scarring change.  Low lung volumes with vascular crowding and bibasilar atelectasis. No definite acute infiltrates, pulmonary edema or pleural effusion. IMPRESSION: Chronic right basilar scarring type changes. No definite acute overlying pulmonary process. Electronically Signed   By: Marijo Sanes M.D.   On: 05/06/2016 18:35     STUDIES:    CULTURES: Blood 4/12 > Sputum 4/12 >  ANTIBIOTICS: Cefepime 4/12 > 4/13 Zosyn 4/13 >  Vancomycin 4/12 >  SIGNIFICANT EVENTS:   LINES/TUBES:   DISCUSSION:  66 y/o female with acute respiratory failure with hypoxemia due to tracheobronchitis vs HCAP in setting of  poor cough mechanics due to patent tracheostomy stoma.  ASSESSMENT / PLAN:  Acute hypoxemic respiratory failure HCAP Patent tracheostomy stoma -Supplemental O2 to keep sats > 90% -Chest PT -out of bed -guaifenesin -deep suctioning q shift and prn -IS and flutter valve if able -Chest PT -occlude stoma as able with dressing -consult ENT to consider closing stoma  Sepsis secondary to suspected HCAP > improving - stop cefepime given history of possible allergy - start zosyn - continue vanc - Follow cultures - Trend PCT  Nutrition - start tube feedings - ice chips by mouth OK - eating by mouth tomorrow is OK - Pepcid for SUP  Hyperglycemia without history of DM - Monitor glucose on BMP - Add SSI if consecutive glucose > 180  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

## 2016-05-07 NOTE — Progress Notes (Signed)
CRITICAL VALUE ALERT  Critical value received:  Troponin 0.03  Date of notification:  05/07/2016  Time of notification:  0330  Critical value read back: Yes  Nurse who received alert:  Lonn Georgia  MD notified (1st page):    Time of first page:    MD notified (2nd page):  Time of second page:  Responding MD:  Charlott Rakes MD  Time MD responded:  [phone]

## 2016-05-07 NOTE — Progress Notes (Signed)
RT suctioned pt stoma per MD order. Husband at bedside. New dressing applied. No complications. Vitals are stable at this time. RT will continue to monitor.

## 2016-05-07 NOTE — Progress Notes (Signed)
Pharmacy Antibiotic Note  Judy Spears is a 66 y.o. female admitted on 05/06/2016 with sepsis/pneumonia. Pharmacy has been consulted for Vancomycin and Zosyn dosing. Patient was started on cefepime early on 4/13, however Dr. Lake Bells stated that she has a history of status epilepticus with cefepime. This was not previously on the allergy list, so it has been added and patient will be transitioned to Zosyn, which she has previously tolerated. Patient is afebrile, wbc elevated at 18.8, and renal function is stable (CrCl ~55 ml/min).   Plan: 1) Continue Vancomycin 500 mg IV every 12 hours 2) Zosyn 3.375 grams IV every 8 hours 3) Vancomycin trough at steady state - vanc trough goal 15-20  4) Follow renal function, culture, LOT, level if needed   Height: 5\' 5"  (165.1 cm) Weight: 110 lb 0.2 oz (49.9 kg) IBW/kg (Calculated) : 57  Temp (24hrs), Avg:98.7 F (37.1 C), Min:98 F (36.7 C), Max:99.9 F (37.7 C)   Recent Labs Lab 05/06/16 1800 05/06/16 1827 05/07/16 0204  WBC 18.8*  --  18.8*  CREATININE 0.49  --  0.38*  LATICACIDVEN  --  1.35  --     Estimated Creatinine Clearance: 56 mL/min (A) (by C-G formula based on SCr of 0.38 mg/dL (L)).    Allergies  Allergen Reactions  . Cefepime Other (See Comments)    Status epilepticus  . Tetracyclines & Related Photosensitivity and Other (See Comments)    Blisters  . Pollen Extract Other (See Comments)    Running nose    Antimicrobials this admission: 4/12 Vancomycin >> 4/13 Cefepime >> 4/13 4/12 Zosyn x 1, 4/13 >>   Dose adjustments this admission: n/a  Microbiology results: 4/12 Blood Cx:  4/12 MRSA PCR: positive  Thank you for allowing pharmacy to be a part of this patient's care.  Belia Heman, PharmD PGY1 Pharmacy Resident [phone] (Pager) 05/07/2016 9:22 AM

## 2016-05-07 NOTE — Progress Notes (Addendum)
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 S/P 2nd trach decannulation yesterday. Admitted 4/12 with SIRS, worsening RR 30s with fever and high wbc.  Per discussion with patient's husband and her caregiver, patient receives Osmolite 1.5 and had previously been up to a rate of 125 ml/h x 12 hours. More recently she had been sick and had vomited and had PNA, so the rate was decreased to 68 ml/h x 14 hours. She was also eating some pureed/soft foods, such as smoothies, pureed soups, mashed sweet potatoes, mashed avocados. She has lost 8 lbs since rehab admission at [facility] in January. Doubt TF volume with variable PO intake was meeting her nutrition needs. Spoke with her husband and her caregiver about increasing TF rate to better meet her needs and they agreed.   Nutrition-Focused physical exam completed. Findings are mild-moderate fat depletion, mild-moderate muscle depletion, and no edema.   Patient with moderate  PCM in the context of chronic illness as evidenced mild-moderate depletion of muscle and subcutaneous fat mass.  Also underweight with BMI = 18.3  Labs and medications reviewed.   Diet Order:  Diet NPO time specified  Skin:  Reviewed, no issues  Last BM:  PTA  Height:   Ht Readings from Last 1 Encounters:  05/07/16 5\' 5"  (1.651 m)    Weight:   Wt Readings from Last 1 Encounters:  05/07/16 110 lb 0.2 oz (49.9 kg)    Ideal Body Weight:  56.8 kg  BMI:  Body mass index is 18.31 kg/m.  Estimated Nutritional Needs:   Kcal:  1700-1900  Protein:  75-90 gm   Fluid:  1.7-1.9 L  EDUCATION NEEDS:   Education needs addressed  Molli Barrows, [place], [place], [place] Pager [phone] After Hours Pager [phone]

## 2016-05-08 ENCOUNTER — Other Ambulatory Visit (HOSPITAL_COMMUNITY): Payer: BC Managed Care – PPO

## 2016-05-08 ENCOUNTER — Inpatient Hospital Stay (HOSPITAL_COMMUNITY): Payer: BC Managed Care – PPO

## 2016-05-08 DIAGNOSIS — R0602 Shortness of breath: Secondary | ICD-10-CM

## 2016-05-08 DIAGNOSIS — I5030 Unspecified diastolic (congestive) heart failure: Secondary | ICD-10-CM

## 2016-05-08 LAB — ECHOCARDIOGRAM COMPLETE
CHL CUP DOP CALC LVOT VTI: 34 cm
CHL CUP MV DEC (S): 229
E decel time: 229 msec
E/e' ratio: 9.96
FS: 38 % (ref 28–44)
HEIGHTINCHES: 65 in
IV/PV OW: 0.89
LA ID, A-P, ES: 28 mm
LA diam index: 1.83 cm/m2
LA vol index: 31.3 mL/m2
LAVOL: 47.9 mL
LAVOLA4C: 48 mL
LDCA: 3.46 cm2
LEFT ATRIUM END SYS DIAM: 28 mm
LV E/e' medial: 9.96
LV E/e'average: 9.96
LV PW d: 8.34 mm — AB (ref 0.6–1.1)
LV TDI E'MEDIAL: 6.53
LVELAT: 8.18 cm/s
LVOT peak grad rest: 10 mmHg
LVOT peak vel: 160 cm/s
LVOTD: 21 mm
LVOTSV: 118 mL
MV Peak grad: 3 mmHg
MV pk A vel: 95.7 m/s
MV pk E vel: 81.5 m/s
RV LATERAL S' VELOCITY: 17.1 cm/s
TAPSE: 28.5 mm
TDI e' lateral: 8.18
WEIGHTICAEL: 1742.52 [oz_av]

## 2016-05-08 LAB — BASIC METABOLIC PANEL
ANION GAP: 9 (ref 5–15)
BUN: 7 mg/dL (ref 6–20)
CO2: 26 mmol/L (ref 22–32)
Calcium: 8.4 mg/dL — ABNORMAL LOW (ref 8.9–10.3)
Chloride: 96 mmol/L — ABNORMAL LOW (ref 101–111)
Creatinine, Ser: <0.3 mg/dL — ABNORMAL LOW (ref 0.44–1.00)
GLUCOSE: 169 mg/dL — AB (ref 65–99)
Potassium: 2.6 mmol/L — CL (ref 3.5–5.1)
Sodium: 131 mmol/L — ABNORMAL LOW (ref 135–145)

## 2016-05-08 LAB — CBC
HEMATOCRIT: 37 % (ref 36.0–46.0)
HEMOGLOBIN: 12.2 g/dL (ref 12.0–15.0)
MCH: 29.3 pg (ref 26.0–34.0)
MCHC: 33 g/dL (ref 30.0–36.0)
MCV: 88.9 fL (ref 78.0–100.0)
Platelets: 224 10*3/uL (ref 150–400)
RBC: 4.16 MIL/uL (ref 3.87–5.11)
RDW: 12.9 % (ref 11.5–15.5)
WBC: 11.7 10*3/uL — ABNORMAL HIGH (ref 4.0–10.5)

## 2016-05-08 LAB — GLUCOSE, CAPILLARY
GLUCOSE-CAPILLARY: 205 mg/dL — AB (ref 65–99)
GLUCOSE-CAPILLARY: 152 mg/dL — AB (ref 65–99)
Glucose-Capillary: 136 mg/dL — ABNORMAL HIGH (ref 65–99)
Glucose-Capillary: 171 mg/dL — ABNORMAL HIGH (ref 65–99)
Glucose-Capillary: 170 mg/dL — ABNORMAL HIGH (ref 65–99)

## 2016-05-08 LAB — MAGNESIUM
MAGNESIUM: 1.7 mg/dL (ref 1.7–2.4)
Magnesium: 1.6 mg/dL — ABNORMAL LOW (ref 1.7–2.4)

## 2016-05-08 LAB — PHOSPHORUS
PHOSPHORUS: 1.6 mg/dL — AB (ref 2.5–4.6)
Phosphorus: 1.2 mg/dL — ABNORMAL LOW (ref 2.5–4.6)

## 2016-05-08 MED ORDER — POTASSIUM CHLORIDE 20 MEQ/15ML (10%) PO SOLN
40.0000 meq | Freq: Two times a day (BID) | ORAL | Status: AC
  Administered 2016-05-08 (×2): 40 meq
  Filled 2016-05-08 (×2): qty 30

## 2016-05-08 MED ORDER — WHITE PETROLATUM GEL
Status: AC
  Administered 2016-05-08: 22:00:00
  Filled 2016-05-08: qty 1

## 2016-05-08 NOTE — Progress Notes (Signed)
Potassium of 2.6 was called. Reported immediately to Dr. Lake Bells, who is at the bedside.

## 2016-05-08 NOTE — Progress Notes (Signed)
[name]IT[facility] CARE MEDICINE   Name: Judy Spears MRN: [id number] DOB: 02-Sep-1950    ADMISSION DATE:  05/06/2016 CONSULTATION DATE:  4/12  REFERRING MD:  Dr. 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   SUBJECTIVE:  Quiet night Was suctioned yesterday several times Was out of bed yesterday  VITAL SIGNS: BP 120/75   Pulse 79   Temp 98.2 F (36.8 C) (Oral)   Resp (!) 30   Ht 5\' 5"  (1.651 m)   Wt 49.4 kg (108 lb 14.5 oz)   LMP 12/01/2010   SpO2 97%   BMI 18.12 kg/m   HEMODYNAMICS:    VENTILATOR SETTINGS:    INTAKE / OUTPUT: I/O last 3 completed shifts: In: 3216.4 [I.V.:1443.8; NG/GT:22.7; IV Piggyback:1750] Out: -   PHYSICAL EXAMINATION: General:  Resting comfortably in bed HENT: NCAT Trach covered PULM: rhonchi, mild tachypnea CV: RRR, no mgr GI: BS+, soft, nontender MSK: normal bulk and tone Neuro: sleepy, was easy to wake on my arrival, interactive, following commands   LABS:  BMET  Recent Labs Lab 05/06/16 1800 05/07/16 0204  NA 132* 135  K 3.5 2.8*  CL 96* 101  CO2 26 26  BUN 12 7  CREATININE 0.49 0.38*  GLUCOSE 121* 113*    Electrolytes  Recent Labs Lab 05/06/16 1800  05/07/16 0204 05/07/16 0931 05/07/16 1657 05/08/16 0414  CALCIUM 9.4  --  8.7*  --   --   --   MG  --   < > 1.5* 2.2 1.8 1.6*  PHOS  --   < > 3.2 2.3* 2.2* 1.6*  < > = values in this interval not displayed.  CBC  Recent Labs Lab 05/06/16 1800 05/07/16 0204  WBC 18.8* 18.8*  HGB 12.9 11.4*  HCT 38.7 35.1*  PLT 251 226    Coag's No results for input(s): APTT, INR in the last 168 hours.  Sepsis Markers  Recent Labs Lab 05/06/16 1800 05/06/16 1827  LATICACIDVEN  --  1.35  PROCALCITON 0.18  --     ABG  Recent Labs Lab  05/06/16 2028  PHART 7.452*  PCO2ART 37.0  PO2ART 65.0*    Liver Enzymes  Recent Labs Lab 05/06/16 1800  AST 36  ALT 80*  ALKPHOS 101  BILITOT 2.0*  ALBUMIN 3.7    Cardiac Enzymes  Recent Labs Lab 05/07/16 0204  TROPONINI 0.03*    Glucose  Recent Labs Lab 05/06/16 2352 05/07/16 2015 05/07/16 2332 05/08/16 0425  GLUCAP 120* 153* 172* 136*    Imaging No results found.   STUDIES:    CULTURES: Blood 4/12 > Sputum 4/12 >  ANTIBIOTICS: Cefepime 4/12 > 4/13 Zosyn 4/13 >  Vancomycin 4/12 >  SIGNIFICANT EVENTS:   LINES/TUBES:   DISCUSSION:  66 y/o female with acute respiratory failure with hypoxemia due to tracheobronchitis vs HCAP in setting of poor cough mechanics due to patent tracheostomy stoma.  ASSESSMENT / PLAN:  Acute hypoxemic respiratory failure HCAP Patent tracheostomy stoma -Supplemental O2 to keep sats > 90% -Chest PT -out of bed -guaifenesin -deep suctioning q shift and prn -IS and flutter valve if able -Chest PT -occlude stoma as able with dressing -ENT to evaluate next week > close stoma?  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

## 2016-05-08 NOTE — Procedures (Signed)
#   4 cuffless trach replaced w/out difficulty   Erick Colace ACNP-BC [place] Pager # [phone] OR # [phone] if no answer

## 2016-05-08 NOTE — Progress Notes (Signed)
VASCULAR LAB PRELIMINARY  PRELIMINARY  PRELIMINARY  PRELIMINARY  Bilateral lower extremity venous duplex completed.    Preliminary report:  There is no DVT or SVT noted in the bilateral lower extremities.   Tory Septer, RVT 05/08/2016, 3:17 PM

## 2016-05-08 NOTE — Progress Notes (Signed)
  Echocardiogram 2D Echocardiogram has been performed.  Johny Chess 05/08/2016, 5:04 PM

## 2016-05-09 ENCOUNTER — Inpatient Hospital Stay (HOSPITAL_COMMUNITY): Payer: BC Managed Care – PPO

## 2016-05-09 DIAGNOSIS — G9340 Encephalopathy, unspecified: Secondary | ICD-10-CM

## 2016-05-09 LAB — BASIC METABOLIC PANEL
ANION GAP: 8 (ref 5–15)
Anion gap: 8 (ref 5–15)
BUN: 8 mg/dL (ref 6–20)
BUN: 7 mg/dL (ref 6–20)
CALCIUM: 8.6 mg/dL — AB (ref 8.9–10.3)
CHLORIDE: 98 mmol/L — AB (ref 101–111)
CO2: 28 mmol/L (ref 22–32)
CO2: 28 mmol/L (ref 22–32)
Calcium: 8.7 mg/dL — ABNORMAL LOW (ref 8.9–10.3)
Chloride: 100 mmol/L — ABNORMAL LOW (ref 101–111)
GLUCOSE: 157 mg/dL — AB (ref 65–99)
Glucose, Bld: 108 mg/dL — ABNORMAL HIGH (ref 65–99)
POTASSIUM: 3.7 mmol/L (ref 3.5–5.1)
Potassium: 2.9 mmol/L — ABNORMAL LOW (ref 3.5–5.1)
SODIUM: 134 mmol/L — AB (ref 135–145)
Sodium: 136 mmol/L (ref 135–145)

## 2016-05-09 LAB — GLUCOSE, CAPILLARY
GLUCOSE-CAPILLARY: 107 mg/dL — AB (ref 65–99)
GLUCOSE-CAPILLARY: 118 mg/dL — AB (ref 65–99)
Glucose-Capillary: 102 mg/dL — ABNORMAL HIGH (ref 65–99)
Glucose-Capillary: 109 mg/dL — ABNORMAL HIGH (ref 65–99)
Glucose-Capillary: 140 mg/dL — ABNORMAL HIGH (ref 65–99)
Glucose-Capillary: 153 mg/dL — ABNORMAL HIGH (ref 65–99)
Glucose-Capillary: 164 mg/dL — ABNORMAL HIGH (ref 65–99)

## 2016-05-09 LAB — POCT I-STAT 3, ART BLOOD GAS (G3+)
Acid-Base Excess: 5 mmol/L — ABNORMAL HIGH (ref 0.0–2.0)
BICARBONATE: 30.1 mmol/L — AB (ref 20.0–28.0)
O2 Saturation: 94 %
PCO2 ART: 43.3 mmHg (ref 32.0–48.0)
Patient temperature: 98.6
TCO2: 31 mmol/L (ref 0–100)
pH, Arterial: 7.45 (ref 7.350–7.450)
pO2, Arterial: 70 mmHg — ABNORMAL LOW (ref 83.0–108.0)

## 2016-05-09 LAB — CBC
HEMATOCRIT: 37.3 % (ref 36.0–46.0)
Hemoglobin: 12.5 g/dL (ref 12.0–15.0)
MCH: 29.6 pg (ref 26.0–34.0)
MCHC: 33.5 g/dL (ref 30.0–36.0)
MCV: 88.4 fL (ref 78.0–100.0)
PLATELETS: 239 10*3/uL (ref 150–400)
RBC: 4.22 MIL/uL (ref 3.87–5.11)
RDW: 13.2 % (ref 11.5–15.5)
WBC: 10 10*3/uL (ref 4.0–10.5)

## 2016-05-09 LAB — PHOSPHORUS
PHOSPHORUS: 3.5 mg/dL (ref 2.5–4.6)
Phosphorus: 1.4 mg/dL — ABNORMAL LOW (ref 2.5–4.6)

## 2016-05-09 LAB — MAGNESIUM
MAGNESIUM: 2 mg/dL (ref 1.7–2.4)
Magnesium: 1.7 mg/dL (ref 1.7–2.4)

## 2016-05-09 MED ORDER — MAGNESIUM SULFATE 2 GM/50ML IV SOLN
2.0000 g | Freq: Once | INTRAVENOUS | Status: AC
  Administered 2016-05-09: 2 g via INTRAVENOUS
  Filled 2016-05-09: qty 50

## 2016-05-09 MED ORDER — BIOGAIA PROBIOTIC PO LIQD
30.0000 mL | Freq: Every morning | ORAL | Status: DC
  Filled 2016-05-09: qty 30

## 2016-05-09 MED ORDER — POTASSIUM PHOSPHATES 15 MMOLE/5ML IV SOLN
30.0000 mmol | Freq: Once | INTRAVENOUS | Status: AC
  Administered 2016-05-09: 30 mmol via INTRAVENOUS
  Filled 2016-05-09: qty 10

## 2016-05-09 MED ORDER — FAMOTIDINE 20 MG PO TABS
20.0000 mg | ORAL_TABLET | Freq: Two times a day (BID) | ORAL | Status: DC
  Administered 2016-05-09 – 2016-05-10 (×3): 20 mg via ORAL
  Filled 2016-05-09 (×4): qty 1

## 2016-05-09 MED ORDER — AMOXICILLIN 250 MG/5ML PO SUSR
500.0000 mg | Freq: Three times a day (TID) | ORAL | Status: DC
  Administered 2016-05-09 (×2): 500 mg
  Filled 2016-05-09 (×5): qty 10

## 2016-05-09 MED ORDER — BACID PO TABS
2.0000 | ORAL_TABLET | Freq: Two times a day (BID) | ORAL | Status: DC
  Administered 2016-05-09 – 2016-05-10 (×4): 2
  Filled 2016-05-09 (×6): qty 2

## 2016-05-09 NOTE — Progress Notes (Signed)
Pt transported to 4E. Pt belongings at the bedside. Extra trach at the bedside. VS stable. Husband at the bedside.

## 2016-05-09 NOTE — Progress Notes (Signed)
[facility] Progress Note Patient Name: Judy Spears DOB: 1950/11/15 MRN: [id number]   Date of Service  05/09/2016  HPI/Events of Note  K+ = 2.9, Mg++ = 1.7, PO4--- = 1.4 and Creatinine < 0.30  eICU Interventions  Will replace K+, PO4--- and Mg++.     Intervention Category Major Interventions: Electrolyte abnormality - evaluation and management  Sommer,Steven Eugene 05/09/2016, 4:05 AM

## 2016-05-09 NOTE — Progress Notes (Signed)
Report given to RN Anda Kraft, [place] room 4

## 2016-05-09 NOTE — Progress Notes (Signed)
[name]ARE [facility]ICINE   Name: EVELYN AGUINALDO MRN: [id number] DOB: [birth year]    ADMISSION DATE:  05/06/2016 CONSULTATION DATE:  4/12  REFERRING MD:  Dr. 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   SUBJECTIVE:  No acute events overnight More somnolent this morning Still following commands On Room air  VITAL SIGNS: BP (!) 136/95   Pulse 78   Temp 98 F (36.7 C) (Oral)   Resp (!) 30   Ht 5\' 5"  (1.651 m)   Wt 47.9 kg (105 lb 9.6 oz)   LMP 12/01/2010   SpO2 98%   BMI 17.57 kg/m   HEMODYNAMICS:    VENTILATOR SETTINGS:    INTAKE / OUTPUT: I/O last 3 completed shifts: In: 5295 [I.V.:1800; Other:150; NG/GT:2085; IV Piggyback:1260] Out: -   PHYSICAL EXAMINATION: General:  Resting comfortably in bed HENT: NCAT Mucus membranes dry   PULM: CTA B, normal effort CV: RRR, no mgr GI: BS+, soft, nontender MSK: normal bulk and tone Neuro: awake, alert, no distress, MAEW    LABS:  BMET  Recent Labs Lab 05/07/16 0204 05/08/16 0805 05/09/16 0215  NA 135 131* 136  K 2.8* 2.6* 2.9*  CL 101 96* 100*  CO2 26 26 28   BUN 7 7 8   CREATININE 0.38* <0.30* <0.30*  GLUCOSE 113* 169* 157*    Electrolytes  Recent Labs Lab 05/07/16 0204  05/08/16 0414 05/08/16 0805 05/08/16 1649 05/09/16 0215  CALCIUM 8.7*  --   --  8.4*  --  8.6*  MG 1.5*  < > 1.6*  --  1.7 1.7  PHOS 3.2  < > 1.6*  --  1.2* 1.4*  < > = values in this interval not displayed.  CBC  Recent Labs Lab 05/07/16 0204 05/08/16 0806 05/09/16 0215  WBC 18.8* 11.7* 10.0  HGB 11.4* 12.2 12.5  HCT 35.1* 37.0 37.3  PLT 226 224 239    Coag's No results for input(s): APTT, INR in the last 168 hours.  Sepsis Markers  Recent Labs Lab 05/06/16 1800 05/06/16 1827   LATICACIDVEN  --  1.35  PROCALCITON 0.18  --     ABG  Recent Labs Lab 05/06/16 2028  PHART 7.452*  PCO2ART 37.0  PO2ART 65.0*    Liver Enzymes  Recent Labs Lab 05/06/16 1800  AST 36  ALT 80*  ALKPHOS 101  BILITOT 2.0*  ALBUMIN 3.7    Cardiac Enzymes  Recent Labs Lab 05/07/16 0204  TROPONINI 0.03*    Glucose  Recent Labs Lab 05/08/16 1154 05/08/16 1604 05/08/16 2017 05/09/16 0029 05/09/16 0429 05/09/16 0807  GLUCAP 170* 205* 152* 109* 107* 164*    Imaging No results found.   STUDIES:    CULTURES: Blood 4/12 > Sputum 4/12 >  ANTIBIOTICS: Cefepime 4/12 > 4/13 Zosyn 4/13 >  Vancomycin 4/12 >  SIGNIFICANT EVENTS:   LINES/TUBES:   DISCUSSION:  66 y/o female with acute respiratory failure with hypoxemia due to tracheobronchitis vs HCAP in setting of poor cough mechanics due to patent tracheostomy stoma.  ASSESSMENT / PLAN:  Acute hypoxemic respiratory failure > resolved HCAP > resolved Patent tracheostomy stoma -CXR today -Chest PT -out of bed -guaifenesin -deep suctioning q shift and prn -IS and flutter valve if able -Chest PT -  maintain #4 cuffless trach in place  Sepsis secondary to suspected HCAP > resolved - stop antibiotics - start pro biotics - Follow cultures - follow repeat antibiotic  Loose stool: chronic issue, given lack of fever, WBC, belly tenderness doubt C diff - monitor  Hypokalemia, hypophosphatemia - supplement both and repeat labs later today  Nutrition - tube feedings at night - diet during day - Pepcid for SUP  Hyperglycemia without history of DM - Monitor glucose on BMP - Add SSI if consecutive glucose > 180  Encephalopathy/deconditioning secondary to viral encephalitis in 2016 : slightly worse 4/15, suspect due to severity of her acute illness, fatigue; no sign of seizure activity - ask neurology to see - check ABG for hypercarbia - stimulate today, PT consult - Continue home  valacyclovir - continue home anti-spasticity regimen  Hypertension - continue home amlodipine  FAMILY  - Updates: family updated bedside (Husband [name])  - Inter-disciplinary family meet or Palliative Care meeting due by:  4/20   Roselie Awkward, MD [facility] Pager: [phone] Cell: [phone] After 3pm or if no response, call [phone]   05/09/2016 8:36 AM

## 2016-05-09 NOTE — Plan of Care (Signed)
Problem: Pain Managment: Goal: General experience of comfort will improve Outcome: Progressing Shakes her head no when questioned about pain, also is in no obvious distress will continue to monitor

## 2016-05-09 NOTE — Consult Note (Signed)
Reason for Consult: Dr Kendrick Fries Referring Physician: altered mental status   HPI: Judy Spears is an 66 y.o. female. who is known to me from previous office visits. She was admitted 3 days ago with respiratory distress, fever, evaluated white count following tracheostomy decannulation and is now admitted with SIRS. She's had physical deconditioning as well as decline in mental status for the last few days prompting this consult. History is obtained from the patient's husband who was available at the bedside as well as patient's nurse and review of electronic medical records. The patient seems to be doing little bit better this morning for the consult was called. She is more interactive and has been falling all commands. She still appears to have generalized weakness which is improving but not back to her baseline. There has been no witnessed seizure activity or acute focal weakness or headache. 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 Subsequently she was hospitalized in December 2017 [facility] with a presumed seizure in the setting of pneumonia. She underwent continuous video EEG which showed  runs of medium voltage rhythmic generalized delta activity (GRDA) throughout the recording which were increased with stimulation. 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 Patient was started on sequential 4 different anticonvulsants without cessation of this activity and hence was transferred to [facility] epilepsy monitoring unit for further treatment. After prolonged observation there these were not  felt to be true seizures and all anticonvulsants were gradually tapered and discontinued. Patient was subsequently transferred back to [facility] inpatient rehabilitation and made gradual improvement and is currently living at home. She has 24-hour caregivers and tone helping with her care. 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 Arthritis    lt great toe  . Complication of anesthesia    pt has had headaches post op-did advise to hydra well preop  . Hair loss    right-sided  . History of exercise stress test    a. ETT (12/15) with 12:00 exercise, no ST changes, occasional PACs.   . Hyperlipidemia   . Insomnia   . Migraine headache   . 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 Postmenopausal   . Premature atrial contractions    a. Holter (12/15) with 8% PACs, no atrial runs or atrial fibrillation.     Past Surgical History:  Procedure Laterality Date  . BREAST BIOPSY  01/01/2011   Procedure: BREAST BIOPSY WITH NEEDLE LOCALIZATION;  Surgeon: Emelia Loron, MD;  Location: [facility];  Service: General;  Laterality: Left;  left breast wire localization biopsy  . cataracts right eye    . CESAREAN SECTION  1986  . HERNIA REPAIR  2000   RIH  . opirectinal membrane peel    . RHINOPLASTY  1983    Family History  Problem Relation Age of Onset  . Stroke Mother     paralyzed on right side/aphasia  . Hypertension Mother     had stroke after stopping BP meds  . Colon  cancer Father   . Prostate cancer Father   . Heart attack Maternal Grandfather 56  . Stroke Maternal Uncle 79    Social History:  reports that she has never smoked. She has never used smokeless tobacco. She reports that she drinks alcohol. She reports that she does not use drugs.  Allergies:  Allergies  Allergen Reactions  . Cefepime Other (See Comments)    Status epilepticus   . Tetracyclines & Related Photosensitivity and Other (See Comments)    Blisters  . Pollen Extract Other (See Comments)    Running nose    Medications: I have reviewed the patient's current medications.  Results for orders placed or performed during the hospital encounter of 05/06/16 (from the past 48 hour(s))  Magnesium     Status: None   Collection Time: 05/07/16  4:57 PM  Result Value Ref Range   Magnesium 1.8 1.7 - 2.4 mg/dL  Phosphorus     Status: Abnormal   Collection Time: 05/07/16  4:57 PM  Result Value Ref Range   Phosphorus 2.2 (L) 2.5 - 4.6 mg/dL  Glucose, capillary     Status: Abnormal   Collection Time: 05/07/16  8:15 PM  Result Value Ref Range   Glucose-Capillary 153 (H) 65 - 99 mg/dL   Comment 1 Notify RN    Comment 2 Document in Chart   Glucose, capillary     Status: Abnormal   Collection Time: 05/07/16 11:32 PM  Result Value Ref Range   Glucose-Capillary 172 (H) 65 - 99 mg/dL   Comment 1 Notify RN    Comment 2 Document in Chart   Magnesium     Status: Abnormal   Collection Time: 05/08/16  4:14 AM  Result Value Ref Range   Magnesium 1.6 (L) 1.7 - 2.4 mg/dL  Phosphorus     Status: Abnormal   Collection Time: 05/08/16  4:14 AM  Result Value Ref Range   Phosphorus 1.6 (L) 2.5 - 4.6 mg/dL  Glucose, capillary     Status: Abnormal   Collection Time: 05/08/16  4:25 AM  Result Value Ref Range   Glucose-Capillary 136 (H) 65 - 99 mg/dL   Comment 1 Notify RN    Comment 2 Document in Chart   Basic metabolic panel     Status: Abnormal   Collection Time: 05/08/16  8:05 AM  Result Value Ref Range   Sodium 131 (L) 135 - 145 mmol/L   Potassium 2.6 (LL) 3.5 - 5.1 mmol/L    Comment: CRITICAL RESULT CALLED TO, READ BACK BY AND VERIFIED WITH: RN M [facility] AT [phone] MARTINB    Chloride 96 (L) 101 - 111 mmol/L   CO2 26 22 - 32 mmol/L   Glucose, Bld 169 (H) 65 - 99 mg/dL   BUN 7 6 - 20 mg/dL   Creatinine, Ser <4.84 (L) 0.44 - 1.00 mg/dL   Calcium 8.4 (L) 8.9 -  10.3 mg/dL   GFR calc non Af Amer NOT CALCULATED >60 mL/min   GFR calc Af Amer NOT CALCULATED >60 mL/min    Comment: (NOTE) The eGFR has been calculated using the CKD EPI equation. This calculation has not been validated in all clinical situations. eGFR's persistently <60 mL/min signify possible Chronic Kidney Disease.    Anion gap 9 5 - 15  CBC     Status: Abnormal   Collection Time: 05/08/16  8:06 AM  Result Value Ref Range   WBC 11.7 (H) 4.0 - 10.5 K/uL   RBC 4.16  3.87 - 5.11 MIL/uL   Hemoglobin 12.2 12.0 - 15.0 g/dL   HCT 37.0 36.0 - 46.0 %   MCV 88.9 78.0 - 100.0 fL   MCH 29.3 26.0 - 34.0 pg   MCHC 33.0 30.0 - 36.0 g/dL   RDW 12.9 11.5 - 15.5 %   Platelets 224 150 - 400 K/uL  Glucose, capillary     Status: Abnormal   Collection Time: 05/08/16  8:07 AM  Result Value Ref Range   Glucose-Capillary 171 (H) 65 - 99 mg/dL   Comment 1 Capillary Specimen    Comment 2 Notify RN   Glucose, capillary     Status: Abnormal   Collection Time: 05/08/16 11:54 AM  Result Value Ref Range   Glucose-Capillary 170 (H) 65 - 99 mg/dL   Comment 1 Capillary Specimen    Comment 2 Notify RN   Glucose, capillary     Status: Abnormal   Collection Time: 05/08/16  4:04 PM  Result Value Ref Range   Glucose-Capillary 205 (H) 65 - 99 mg/dL   Comment 1 Capillary Specimen    Comment 2 Notify RN   Magnesium     Status: None   Collection Time: 05/08/16  4:49 PM  Result Value Ref Range   Magnesium 1.7 1.7 - 2.4 mg/dL  Phosphorus     Status: Abnormal   Collection Time: 05/08/16  4:49 PM  Result Value Ref Range   Phosphorus 1.2 (L) 2.5 - 4.6 mg/dL  Glucose, capillary     Status: Abnormal   Collection Time: 05/08/16  8:17 PM  Result Value Ref Range   Glucose-Capillary 152 (H) 65 - 99 mg/dL  Glucose, capillary     Status: Abnormal   Collection Time: 05/09/16 12:29 AM  Result Value Ref Range   Glucose-Capillary 109 (H) 65 - 99 mg/dL   Comment 1 Notify RN   Magnesium     Status: None   Collection  Time: 05/09/16  2:15 AM  Result Value Ref Range   Magnesium 1.7 1.7 - 2.4 mg/dL  Phosphorus     Status: Abnormal   Collection Time: 05/09/16  2:15 AM  Result Value Ref Range   Phosphorus 1.4 (L) 2.5 - 4.6 mg/dL  CBC     Status: None   Collection Time: 05/09/16  2:15 AM  Result Value Ref Range   WBC 10.0 4.0 - 10.5 K/uL   RBC 4.22 3.87 - 5.11 MIL/uL   Hemoglobin 12.5 12.0 - 15.0 g/dL   HCT 37.3 36.0 - 46.0 %   MCV 88.4 78.0 - 100.0 fL   MCH 29.6 26.0 - 34.0 pg   MCHC 33.5 30.0 - 36.0 g/dL   RDW 13.2 11.5 - 15.5 %   Platelets 239 150 - 400 K/uL  Basic metabolic panel     Status: Abnormal   Collection Time: 05/09/16  2:15 AM  Result Value Ref Range   Sodium 136 135 - 145 mmol/L   Potassium 2.9 (L) 3.5 - 5.1 mmol/L   Chloride 100 (L) 101 - 111 mmol/L   CO2 28 22 - 32 mmol/L   Glucose, Bld 157 (H) 65 - 99 mg/dL   BUN 8 6 - 20 mg/dL   Creatinine, Ser <0.30 (L) 0.44 - 1.00 mg/dL   Calcium 8.6 (L) 8.9 - 10.3 mg/dL   GFR calc non Af Amer NOT CALCULATED >60 mL/min   GFR calc Af Amer NOT CALCULATED >60 mL/min    Comment: (NOTE) The eGFR has been calculated using  the CKD EPI equation. This calculation has not been validated in all clinical situations. eGFR's persistently <60 mL/min signify possible Chronic Kidney Disease.    Anion gap 8 5 - 15  Glucose, capillary     Status: Abnormal   Collection Time: 05/09/16  4:29 AM  Result Value Ref Range   Glucose-Capillary 107 (H) 65 - 99 mg/dL   Comment 1 Notify RN   Glucose, capillary     Status: Abnormal   Collection Time: 05/09/16  8:07 AM  Result Value Ref Range   Glucose-Capillary 164 (H) 65 - 99 mg/dL  I-STAT 3, arterial blood gas (G3+)     Status: Abnormal   Collection Time: 05/09/16  9:08 AM  Result Value Ref Range   pH, Arterial 7.450 7.350 - 7.450   pCO2 arterial 43.3 32.0 - 48.0 mmHg   pO2, Arterial 70.0 (L) 83.0 - 108.0 mmHg   Bicarbonate 30.1 (H) 20.0 - 28.0 mmol/L   TCO2 31 0 - 100 mmol/L   O2 Saturation 94.0 %    Acid-Base Excess 5.0 (H) 0.0 - 2.0 mmol/L   Patient temperature 98.6 F    Collection site RADIAL, ALLEN'S TEST ACCEPTABLE    Sample type ARTERIAL   Glucose, capillary     Status: Abnormal   Collection Time: 05/09/16 12:19 PM  Result Value Ref Range   Glucose-Capillary 102 (H) 65 - 99 mg/dL   Comment 1 Capillary Specimen    Comment 2 Notify RN     Dg Chest Port 1 View  Result Date: 05/09/2016 CLINICAL DATA:  Acute on chronic respiratory failure. EXAM: PORTABLE CHEST 1 VIEW COMPARISON:  One-view chest x-ray 05/07/2016. FINDINGS: The heart size is exaggerated by low lung volumes. Moderate pulmonary vascular congestion is increasing. Patchy bibasilar airspace disease is noted. There is fluid in the minor fissure. Tracheostomy tube is stable. IMPRESSION: 1. Increasing pulmonary vascular congestion. 2. Patchy bibasilar airspace disease may reflect atelectasis. Lower lobar pneumonia is not excluded. Electronically Signed   By: Marin Roberts M.D.   On: 05/09/2016 12:24       ROS Blood pressure 120/88, pulse 80, temperature 98 F (36.7 C), temperature source Oral, resp. rate (!) 31, height 5\' 5"  (1.651 m), weight 105 lb 9.6 oz (47.9 kg), last menstrual period 12/01/2010, SpO2 98 %. Physical Exam Frail and malnourished cachectic looking middle-aged Caucasian lady. . Afebrile. Head is nontraumatic. Neck is supple without bruit.    Cardiac exam no murmur or gallop. Lungs are clear to auscultation. Distal pulses are well felt. Neurological Exam :  Mental Status: Awake and fully alert. Follows simple midline and one-step commands by nodding her head and signaling yes and no. Unable to speak sentences but can mouth a few occasional short words only. Has tracheostomy .  Cranial Nerves: Fundoscopic exam not done   Pupils equal, briskly reactive to light. Extraocular movements full without nystagmus but prominent saccadic dysmetria. Visual fields full to threat Hearing probably okay Facial sensation  intact. Face, tongue, palate moves normally and symmetrically.  Motor: Diminished bulk and tone.throughout. Quadriparesis 2- 3/5 but more weakness in the right upper extremity  with weakness of right grip. Mild proximal and distal weakness throughout. Bilateral non-fixed finger flexion contractures at the wrist. Bilateral foot drop   Sensory.:  Difficult to test but does withdraw to painful stimuli equally all 4 extremities.  Coordination: Cannot be reliably tested Gait and Station: Patient is in a wheelchair and unable to walk with assistance  Reflexes: 1+ and symmetric.  Toes downgoing.  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 Plan : I had a long discussion with the patient's husband regarding worsening of existing mental status and neurological deficits in the setting of acute medical illness being common and is likely to improve over the next few days. He voiced understanding. I do not believe doing any brain imaging or EEG study is indicated at the present time. I will continue to follow the patient and if necessary change recommendations. Continue present ongoing treatment for her pneumonia and medical issues. Kindly call for questions. Discussed with Dr. Lake Bells. Greater than 50% time during this 35 minute consultation visit was spent on counseling and coordination of care about her heart mental status, neurological worsening in the setting of acute medical illness and answering questions  Antony Contras, MD    Jeanine Caven 05/09/2016, 12:36 PM    Note: This document was prepared with digital dictation and possible smart phrase technology. Any transcriptional errors that result from this process are unintentional.

## 2016-05-09 NOTE — Progress Notes (Signed)
PT Cancellation Note  Patient Details Name: Judy Spears MRN: [id number] DOB: August 03, 1950   Cancelled Treatment:    Reason Eval/Treat Not Completed: Other (comment).  Attempted to see patient x2 today.  Patient preparing to transfer to new floor.  And patient now with nursing on new floor.  Will return tomorrow for PT evaluation.   Despina Pole 05/09/2016, 6:19 PM [name]. [name], [facility] Pager [phone]

## 2016-05-10 ENCOUNTER — Ambulatory Visit: Payer: BC Managed Care – PPO | Admitting: Physical Therapy

## 2016-05-10 ENCOUNTER — Ambulatory Visit: Payer: BC Managed Care – PPO | Admitting: Occupational Therapy

## 2016-05-10 ENCOUNTER — Encounter: Payer: BC Managed Care – PPO | Admitting: Physical Medicine & Rehabilitation

## 2016-05-10 ENCOUNTER — Ambulatory Visit: Payer: BC Managed Care – PPO | Admitting: Speech Pathology

## 2016-05-10 DIAGNOSIS — R29898 Other symptoms and signs involving the musculoskeletal system: Secondary | ICD-10-CM

## 2016-05-10 DIAGNOSIS — A86 Unspecified viral encephalitis: Secondary | ICD-10-CM

## 2016-05-10 LAB — GLUCOSE, CAPILLARY: Glucose-Capillary: 181 mg/dL — ABNORMAL HIGH (ref 65–99)

## 2016-05-10 LAB — BASIC METABOLIC PANEL
Anion gap: 10 (ref 5–15)
BUN: 10 mg/dL (ref 6–20)
CALCIUM: 9.3 mg/dL (ref 8.9–10.3)
CHLORIDE: 97 mmol/L — AB (ref 101–111)
CO2: 28 mmol/L (ref 22–32)
Creatinine, Ser: <0.3 mg/dL — ABNORMAL LOW (ref 0.44–1.00)
GLUCOSE: 146 mg/dL — AB (ref 65–99)
POTASSIUM: 3.2 mmol/L — AB (ref 3.5–5.1)
Sodium: 135 mmol/L (ref 135–145)

## 2016-05-10 LAB — CBC WITH DIFFERENTIAL/PLATELET
Basophils Absolute: 0 10*3/uL (ref 0.0–0.1)
Basophils Relative: 0 %
Eosinophils Absolute: 0.2 10*3/uL (ref 0.0–0.7)
Eosinophils Relative: 2 %
HEMATOCRIT: 39.9 % (ref 36.0–46.0)
HEMOGLOBIN: 13.3 g/dL (ref 12.0–15.0)
LYMPHS ABS: 2.2 10*3/uL (ref 0.7–4.0)
LYMPHS PCT: 29 %
MCH: 29.6 pg (ref 26.0–34.0)
MCHC: 33.3 g/dL (ref 30.0–36.0)
MCV: 88.7 fL (ref 78.0–100.0)
MONOS PCT: 12 %
Monocytes Absolute: 0.9 10*3/uL (ref 0.1–1.0)
NEUTROS ABS: 4.2 10*3/uL (ref 1.7–7.7)
NEUTROS PCT: 57 %
Platelets: 312 10*3/uL (ref 150–400)
RBC: 4.5 MIL/uL (ref 3.87–5.11)
RDW: 13.3 % (ref 11.5–15.5)
WBC: 7.4 10*3/uL (ref 4.0–10.5)

## 2016-05-10 LAB — MAGNESIUM: Magnesium: 2 mg/dL (ref 1.7–2.4)

## 2016-05-10 LAB — PHOSPHORUS: PHOSPHORUS: 2.9 mg/dL (ref 2.5–4.6)

## 2016-05-10 MED ORDER — POTASSIUM CHLORIDE 20 MEQ/15ML (10%) PO SOLN
40.0000 meq | Freq: Once | ORAL | Status: AC
  Administered 2016-05-10: 40 meq
  Filled 2016-05-10: qty 30

## 2016-05-10 MED ORDER — ADULT MULTIVITAMIN LIQUID CH
15.0000 mL | Freq: Every day | ORAL | Status: DC
  Administered 2016-05-10: 15 mL via ORAL
  Filled 2016-05-10 (×2): qty 15

## 2016-05-10 MED ORDER — AMOXICILLIN-POT CLAVULANATE 400-57 MG/5ML PO SUSR
10.0000 mL | Freq: Two times a day (BID) | ORAL | Status: DC
  Administered 2016-05-10 (×2): 10 mL
  Filled 2016-05-10 (×3): qty 10

## 2016-05-10 NOTE — Progress Notes (Signed)
Neurology Progress Note  Subjective :  She is neurologically stable and remains alert and interactive. She is following commands and nods appropriately to questions. Her baseline weakness and quadriparesis is unchanged. Her antibiotics have been switched now to Augmentin. Patient's husband is not available at the bedside but her caregiver is Vitals:   05/10/16 1127 05/10/16 1152  BP:  (!) 148/96  Pulse: 89 85  Resp: (!) 24 (!) 49  Temp:  98.9 F (37.2 C)   Physical Exam Frail and malnourished cachectic looking middle-aged Caucasian lady. . Afebrile. Head is nontraumatic. Neck is supple without bruit.    Cardiac exam no murmur or gallop. Lungs are clear to auscultation. Distal pulses are well felt. Neurological Exam :  Mental Status: Awake and fully alert. Follows simple midline and one-step commands by nodding her head and signaling yes and no. Unable to speak sentences but can mouth a few occasional short words only. Has tracheostomy . Cranial Nerves: Fundoscopic exam not done   Pupils equal, briskly reactive to light. Extraocular movements full without nystagmus but prominent saccadic dysmetria. Visual fields full to threatHearing probably okay Facial sensation intact. Face, tongue, palate moves normally and symmetrically.  Motor: Diminishedbulk and tone.throughout. Quadriparesis 2- 3/5but more weakness in the right upper extremity with weakness of right grip. Mild proximal and distal weakness throughout. Bilateral non-fixed finger flexion contractures at the wrist. Bilateral foot drop  Sensory.:  Difficult to test but does withdraw to painful stimuli equally all 4 extremities. Coordination:Cannot be reliably tested Gait and Station:Patient is in a wheelchair and unable to walk with assistance  Reflexes: 1+ and symmetric. Toes downgoing.  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 No further neurological testing and medication changes indicated at the present time. Patient may be seen for follow-up in the office in the future as needed. Patient's husband not available for discussion but explained plan of care to the patient and caregiver at the bedside and they are in agreement  Antony Contras, MD

## 2016-05-10 NOTE — Telephone Encounter (Signed)
LM for Judy Spears, pt husband to return our call.  Please schedule pt in 05/11/16 held spot on BQ schedule. Thanks.

## 2016-05-10 NOTE — Evaluation (Signed)
Physical Therapy Evaluation Patient Details Name: JAYCE BOYKO MRN: [id number] DOB: Feb 19, 1950 Today's Date: 05/10/2016   History of Present Illness  66 y/o female with a history of viral encephalitis well known to our service and a lengthy history of tracheostomy since 12/2014 returned to our hospital on 4/12 with fever, hypoxemia, found to have a RLL infiltrate.  Clinical Impression  Patient presents with decreased balance, strength and endurance with RR in 40's and weak cough.  She continues to require significant assist for mobility and will benefit from skilled PT in the acute setting to allow return home with family and caregiver support and outpatient PT.     Follow Up Recommendations Outpatient PT;Supervision/Assistance - 24 hour    Equipment Recommendations  None recommended by PT    Recommendations for Other Services       Precautions / Restrictions Precautions Precautions: Fall      Mobility  Bed Mobility Overal bed mobility: Needs Assistance Bed Mobility: Rolling;Sidelying to Sit Rolling: Max assist Sidelying to sit: Max assist       General bed mobility comments: assist to reach for railing and move hips into sidelying, assist for hygiene due to soiled with BM, then side to sit max A for legs off bed and lifting trunk  Transfers Overall transfer level: Needs assistance   Transfers: Sit to/from Stand;Stand Pivot Transfers Sit to Stand: Max assist Stand pivot transfers: Max assist       General transfer comment: lifting assist and for anterior weight shift, then pivot on feet to recliner, pt not moving feet  Ambulation/Gait                Stairs            Wheelchair Mobility    Modified Rankin (Stroke Patients Only)       Balance Overall balance assessment: Needs assistance Sitting-balance support: Feet supported Sitting balance-Leahy Scale: Poor Sitting balance - Comments: leaning L and posterior, assist for cervical AAROM and for  looking over R shoulder, then to reach up and to R with L UE for balance and R trunk elongation, facilitation through ribs for R lateral weight shift Postural control: Posterior lean;Left lateral lean   Standing balance-Leahy Scale: Poor Standing balance comment: stand pivot to chair max A for standing with support behind hips, etc                             Pertinent Vitals/Pain Pain Assessment: No/denies pain    Home Living Family/patient expects to be discharged to:: Private residence Living Arrangements: Spouse/significant other Available Help at Discharge: Family;Personal care attendant;Available 24 hours/day Type of Home: House Home Access: Ramped entrance     Home Layout: Two level;Bed/bath upstairs Home Equipment: Hospital bed;Wheelchair - manual;Tub bench;Hand held shower head      Prior Function Level of Independence: Needs assistance   Gait / Transfers Assistance Needed: max assist +1 for bed mobility. max assist sit to stand with stedy. Patient was doing stand pivot until LLE weakness onset 2 months ago. Can do standing pivot max assist to wheelchair.   ADL's / Homemaking Assistance Needed: max assist with bathing, dressing.         Hand Dominance   Dominant Hand: Right    Extremity/Trunk Assessment   Upper Extremity Assessment Upper Extremity Assessment: RUE deficits/detail;LUE deficits/detail RUE Deficits / Details: tremors and ataxic movements of extremities, motion not specifically tested, noted tightness in fingers  and elbow LUE Deficits / Details: tremors and ataxic movements of extremities, motion not specifically tested, noted tightness in fingers and elbow    Lower Extremity Assessment Lower Extremity Assessment: RLE deficits/detail;LLE deficits/detail RLE Deficits / Details: extensor tone noted, able to move out of position, but modified ashworth score 3/4 LLE Deficits / Details: positioned in external rotation and abduction with hip  and knee flexion, tightness in hip abductors and external rotators    Cervical / Trunk Assessment Cervical / Trunk Assessment: Other exceptions Cervical / Trunk Exceptions: tight cervical musculature with head tilted to L and R rotation; L trunk shortening and weight shifted L in sitting  Communication   Communication: Tracheostomy  Cognition Arousal/Alertness: Awake/alert Behavior During Therapy: WFL for tasks assessed/performed Overall Cognitive Status: History of cognitive impairments - at baseline                                        General Comments      Exercises General Exercises - Lower Extremity Ankle Circles/Pumps: PROM;Right;Other (comment) (heel cord stretch x 60 sec on R) Heel Slides: AAROM;Both;5 reps;Supine   Assessment/Plan    PT Assessment Patient needs continued PT services  PT Problem List Decreased balance;Decreased strength;Decreased mobility;Decreased coordination;Decreased activity tolerance       PT Treatment Interventions DME instruction;Therapeutic activities;Therapeutic exercise;Patient/family education;Balance training;Functional mobility training;Neuromuscular re-education    PT Goals (Current goals can be found in the Care Plan section)  Acute Rehab PT Goals Patient Stated Goal: To return to outpatient rehab PT Goal Formulation: With patient/family Time For Goal Achievement: 05/17/16 Potential to Achieve Goals: Fair    Frequency Min 3X/week   Barriers to discharge        Co-evaluation               End of Session Equipment Utilized During Treatment: Gait belt Activity Tolerance: Patient tolerated treatment well Patient left: in chair;with call bell/phone within reach;with family/visitor present Nurse Communication: Mobility status;Need for lift equipment PT Visit Diagnosis: Other symptoms and signs involving the nervous system (R29.898);Other abnormalities of gait and mobility (R26.89);Muscle weakness  (generalized) (M62.81)    Time: 5638-7564 PT Time Calculation (min) (ACUTE ONLY): 48 min   Charges:   PT Evaluation $PT Eval Moderate Complexity: 1 Procedure PT Treatments $Therapeutic Activity: 23-37 mins   PT G CodesMagda Kiel, [place] [phone] 05/10/2016   Reginia Naas 05/10/2016, 1:13 PM

## 2016-05-10 NOTE — Progress Notes (Signed)
[name]   Name: Judy Spears MRN: [id number] DOB: 01-22-1951    ADMISSION DATE:  05/06/2016 CONSULTATION DATE:  4/12  REFERRING MD:  Dr. 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   SUBJECTIVE:  More awake Quiet night Temp up slightly  VITAL SIGNS: BP 127/80 (BP Location: Right Arm)   Pulse 82   Temp 99.2 F (37.3 C) (Oral)   Resp (!) 32   Ht 5\' 5"  (1.651 m)   Wt 47.8 kg (105 lb 6.1 oz)   LMP 12/01/2010   SpO2 97%   BMI 17.54 kg/m   HEMODYNAMICS:    VENTILATOR SETTINGS:    INTAKE / OUTPUT: I/O last 3 completed shifts: In: 5352.8 [P.O.:120; I.V.:1090; Other:390; NG/GT:2492.8; IV Piggyback:1260] Out: -   PHYSICAL EXAMINATION: General:  Resting comfortably in bed HENT: NCAT trach clean, dry PULM: Few crackles left, clear otherwise, normal effort CV: RRR, no mgr GI: BS+, soft, nontender MSK: normal bulk and tone Neuro: wakes to voice, smiling, nods head to questions appropriately     LABS:  BMET  Recent Labs Lab 05/09/16 0215 05/09/16 1144 05/10/16 0206  NA 136 134* 135  K 2.9* 3.7 3.2*  CL 100* 98* 97*  CO2 28 28 28   BUN 8 7 10   CREATININE <0.30* <0.30* <0.30*  GLUCOSE 157* 108* 146*    Electrolytes  Recent Labs Lab 05/09/16 0215 05/09/16 1144 05/10/16 0206  CALCIUM 8.6* 8.7* 9.3  MG 1.7 2.0 2.0  PHOS 1.4* 3.5 2.9    CBC  Recent Labs Lab 05/07/16 0204 05/08/16 0806 05/09/16 0215  WBC 18.8* 11.7* 10.0  HGB 11.4* 12.2 12.5  HCT 35.1* 37.0 37.3  PLT 226 224 239    Coag's No results for input(s): APTT, INR in the last 168 hours.  Sepsis Markers  Recent Labs Lab 05/06/16 1800 05/06/16 1827  LATICACIDVEN  --  1.35  PROCALCITON 0.18  --     ABG  Recent Labs Lab  05/06/16 2028 05/09/16 0908  PHART 7.452* 7.450  PCO2ART 37.0 43.3  PO2ART 65.0* 70.0*    Liver Enzymes  Recent Labs Lab 05/06/16 1800  AST 36  ALT 80*  ALKPHOS 101  BILITOT 2.0*  ALBUMIN 3.7    Cardiac Enzymes  Recent Labs Lab 05/07/16 0204  TROPONINI 0.03*    Glucose  Recent Labs Lab 05/09/16 0807 05/09/16 1219 05/09/16 1632 05/09/16 1941 05/09/16 2306 05/10/16 0312  GLUCAP 164* 102* 118* 140* 153* 181*    Imaging Dg Chest Port 1 View  Result Date: 05/09/2016 CLINICAL DATA:  Acute on chronic respiratory failure. EXAM: PORTABLE CHEST 1 VIEW COMPARISON:  One-view chest x-ray 05/07/2016. FINDINGS: The heart size is exaggerated by low lung volumes. Moderate pulmonary vascular congestion is increasing. Patchy bibasilar airspace disease is noted. There is fluid in the minor fissure. Tracheostomy tube is stable. IMPRESSION: 1. Increasing pulmonary vascular congestion. 2. Patchy bibasilar airspace disease may reflect atelectasis. Lower lobar pneumonia is not excluded. Electronically Signed   By: San Morelle M.D.   On: 05/09/2016 12:24     STUDIES:    CULTURES: Blood 4/12 > Sputum 4/12 >  ANTIBIOTICS: Cefepime 4/12 > 4/13 Zosyn 4/13 > 4/15 Vancomycin 4/12 > 5/15 Amoxicillin 4/15 > 4/16 Augmentin 4/16 >  SIGNIFICANT EVENTS:   LINES/TUBES:   DISCUSSION:  66 y/o female with acute respiratory failure with hypoxemia due to tracheobronchitis vs HCAP in setting of poor cough mechanics due to patent tracheostomy stoma.  ASSESSMENT / PLAN:  Acute hypoxemic respiratory failure > resolved HCAP > resolving Patent tracheostomy stoma - CXR PRN - continue chest PT - out of bed - continue guaifenesin - deep suctioning q shift and prn - maintain #4 cuffless trach in place, capped - will change to [facility] tracheostomy as outpatient next clinic visit - will consider tracheostomy stoma closure  HCAP > overall much better, resolving but temp  creeping up, +/- infiltrate RLL - change amox to augmentin for broader coverage and BID dosing  Loose stool: chronic issue, given lack of fever, WBC, belly tenderness doubt C diff - monitor  Hypokalemia Hypophosphatemia> resolved - supplement KCL now  Nutrition - tube feedings at night - diet during day - Pepcid for SUP - add MVI  Hyperglycemia without history of DM - Monitor glucose on BMP - Add SSI if consecutive glucose > 180  Encephalopathy/deconditioning secondary to viral encephalitis in 2016> improving today Deconditioning - neurology appreciated - stimulate today, PT consult - Continue home valacyclovir - continue home anti-spasticity regimen  Hypertension - continue home amlodipine  FAMILY  - Updates: family updated bedside (Husband [name]) 4/15, will call today  - Inter-disciplinary family meet or Palliative Care meeting due by:  4/20  Discussed with RN and RT Depending on progress with PT may be able to be D/C'd home today or tomorrow  Roselie Awkward, MD [place] Pager: [phone] Cell: [phone] After 3pm or if no response, call [phone]   05/10/2016 7:19 AM

## 2016-05-10 NOTE — Consult Note (Signed)
Reason for Consult:Tracheostomy site not healing Referring Physician: Juanito Doom, MD  Judy Spears is an 66 y.o. female.  HPI: Had tracheostomy a couple years ago, has been decannulated a couple times but the trach site failed to heal. Having trouble generating a strong cough, now with aspiration pneumonia. I was called by Pulm/CCM last week and suggested they replace the trach and plug it for now.   Past Medical History:  Diagnosis Date  . Arthritis    lt great toe  . Complication of anesthesia    pt has had headaches post op-did advise to hydra well preop  . Hair loss    right-sided  . History of exercise stress test    a. ETT (12/15) with 12:00 exercise, no ST changes, occasional PACs.   . Hyperlipidemia   . Insomnia   . Migraine headache   . 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 Postmenopausal   . Premature atrial contractions    a. Holter (12/15) with 8% PACs, no atrial runs or atrial fibrillation.     Past Surgical History:  Procedure Laterality Date  . BREAST BIOPSY  01/01/2011   Procedure: BREAST BIOPSY WITH NEEDLE LOCALIZATION;  Surgeon: Rolm Bookbinder, MD;  Location: [facility];  Service: General;  Laterality: Left;  left breast wire localization biopsy  . cataracts right eye    . CESAREAN SECTION  1986  . HERNIA REPAIR  2000   RIH  . opirectinal membrane peel    . RHINOPLASTY  1983    Family History  Problem Relation Age of Onset  . Stroke Mother     paralyzed on right side/aphasia  . Hypertension Mother     had stroke after stopping BP meds  . Colon cancer Father   . Prostate cancer Father   . Heart attack Maternal Grandfather 56  . Stroke Maternal Uncle 26    Social History:  reports that she has never smoked. She has never used smokeless tobacco. She reports that she drinks alcohol. She reports that she does not use drugs.  Allergies:  Allergies  Allergen Reactions  . Cefepime Other (See Comments)    Status  epilepticus  . Tetracyclines & Related Photosensitivity and Other (See Comments)    Blisters  . Pollen Extract Other (See Comments)    Running nose    Medications: Reviewed  Results for orders placed or performed during the hospital encounter of 05/06/16 (from the past 48 hour(s))  Glucose, capillary     Status: Abnormal   Collection Time: 05/08/16  4:04 PM  Result Value Ref Range   Glucose-Capillary 205 (H) 65 - 99 mg/dL   Comment 1 Capillary Specimen    Comment 2 Notify RN   Magnesium     Status: None   Collection Time: 05/08/16  4:49 PM  Result Value Ref Range   Magnesium 1.7 1.7 - 2.4 mg/dL  Phosphorus     Status: Abnormal   Collection Time: 05/08/16  4:49 PM  Result Value Ref Range   Phosphorus 1.2 (L) 2.5 - 4.6 mg/dL  Glucose, capillary     Status: Abnormal   Collection Time: 05/08/16  8:17 PM  Result Value Ref Range   Glucose-Capillary 152 (H) 65 - 99 mg/dL  Glucose, capillary     Status: Abnormal   Collection Time: 05/09/16 12:29 AM  Result Value Ref Range   Glucose-Capillary 109 (H) 65 - 99 mg/dL   Comment 1 Notify RN  Magnesium     Status: None   Collection Time: 05/09/16  2:15 AM  Result Value Ref Range   Magnesium 1.7 1.7 - 2.4 mg/dL  Phosphorus     Status: Abnormal   Collection Time: 05/09/16  2:15 AM  Result Value Ref Range   Phosphorus 1.4 (L) 2.5 - 4.6 mg/dL  CBC     Status: None   Collection Time: 05/09/16  2:15 AM  Result Value Ref Range   WBC 10.0 4.0 - 10.5 K/uL   RBC 4.22 3.87 - 5.11 MIL/uL   Hemoglobin 12.5 12.0 - 15.0 g/dL   HCT 37.3 36.0 - 46.0 %   MCV 88.4 78.0 - 100.0 fL   MCH 29.6 26.0 - 34.0 pg   MCHC 33.5 30.0 - 36.0 g/dL   RDW 13.2 11.5 - 15.5 %   Platelets 239 150 - 400 K/uL  Basic metabolic panel     Status: Abnormal   Collection Time: 05/09/16  2:15 AM  Result Value Ref Range   Sodium 136 135 - 145 mmol/L   Potassium 2.9 (L) 3.5 - 5.1 mmol/L   Chloride 100 (L) 101 - 111 mmol/L   CO2 28 22 - 32 mmol/L   Glucose, Bld 157 (H)  65 - 99 mg/dL   BUN 8 6 - 20 mg/dL   Creatinine, Ser <0.30 (L) 0.44 - 1.00 mg/dL   Calcium 8.6 (L) 8.9 - 10.3 mg/dL   GFR calc non Af Amer NOT CALCULATED >60 mL/min   GFR calc Af Amer NOT CALCULATED >60 mL/min    Comment: (NOTE) The eGFR has been calculated using the CKD EPI equation. This calculation has not been validated in all clinical situations. eGFR's persistently <60 mL/min signify possible Chronic Kidney Disease.    Anion gap 8 5 - 15  Glucose, capillary     Status: Abnormal   Collection Time: 05/09/16  4:29 AM  Result Value Ref Range   Glucose-Capillary 107 (H) 65 - 99 mg/dL   Comment 1 Notify RN   Glucose, capillary     Status: Abnormal   Collection Time: 05/09/16  8:07 AM  Result Value Ref Range   Glucose-Capillary 164 (H) 65 - 99 mg/dL  I-STAT 3, arterial blood gas (G3+)     Status: Abnormal   Collection Time: 05/09/16  9:08 AM  Result Value Ref Range   pH, Arterial 7.450 7.350 - 7.450   pCO2 arterial 43.3 32.0 - 48.0 mmHg   pO2, Arterial 70.0 (L) 83.0 - 108.0 mmHg   Bicarbonate 30.1 (H) 20.0 - 28.0 mmol/L   TCO2 31 0 - 100 mmol/L   O2 Saturation 94.0 %   Acid-Base Excess 5.0 (H) 0.0 - 2.0 mmol/L   Patient temperature 98.6 F    Collection site RADIAL, ALLEN'S TEST ACCEPTABLE    Sample type ARTERIAL   Basic metabolic panel     Status: Abnormal   Collection Time: 05/09/16 11:44 AM  Result Value Ref Range   Sodium 134 (L) 135 - 145 mmol/L   Potassium 3.7 3.5 - 5.1 mmol/L    Comment: DELTA CHECK NOTED   Chloride 98 (L) 101 - 111 mmol/L   CO2 28 22 - 32 mmol/L   Glucose, Bld 108 (H) 65 - 99 mg/dL   BUN 7 6 - 20 mg/dL   Creatinine, Ser <0.30 (L) 0.44 - 1.00 mg/dL   Calcium 8.7 (L) 8.9 - 10.3 mg/dL   GFR calc non Af Amer NOT CALCULATED >60 mL/min  GFR calc Af Amer NOT CALCULATED >60 mL/min    Comment: (NOTE) The eGFR has been calculated using the CKD EPI equation. This calculation has not been validated in all clinical situations. eGFR's persistently <60  mL/min signify possible Chronic Kidney Disease.    Anion gap 8 5 - 15  Magnesium     Status: None   Collection Time: 05/09/16 11:44 AM  Result Value Ref Range   Magnesium 2.0 1.7 - 2.4 mg/dL  Phosphorus     Status: None   Collection Time: 05/09/16 11:44 AM  Result Value Ref Range   Phosphorus 3.5 2.5 - 4.6 mg/dL  Glucose, capillary     Status: Abnormal   Collection Time: 05/09/16 12:19 PM  Result Value Ref Range   Glucose-Capillary 102 (H) 65 - 99 mg/dL   Comment 1 Capillary Specimen    Comment 2 Notify RN   Glucose, capillary     Status: Abnormal   Collection Time: 05/09/16  4:32 PM  Result Value Ref Range   Glucose-Capillary 118 (H) 65 - 99 mg/dL  Glucose, capillary     Status: Abnormal   Collection Time: 05/09/16  7:41 PM  Result Value Ref Range   Glucose-Capillary 140 (H) 65 - 99 mg/dL  Glucose, capillary     Status: Abnormal   Collection Time: 05/09/16 11:06 PM  Result Value Ref Range   Glucose-Capillary 153 (H) 65 - 99 mg/dL  Magnesium     Status: None   Collection Time: 05/10/16  2:06 AM  Result Value Ref Range   Magnesium 2.0 1.7 - 2.4 mg/dL  Phosphorus     Status: None   Collection Time: 05/10/16  2:06 AM  Result Value Ref Range   Phosphorus 2.9 2.5 - 4.6 mg/dL  Basic metabolic panel     Status: Abnormal   Collection Time: 05/10/16  2:06 AM  Result Value Ref Range   Sodium 135 135 - 145 mmol/L   Potassium 3.2 (L) 3.5 - 5.1 mmol/L   Chloride 97 (L) 101 - 111 mmol/L   CO2 28 22 - 32 mmol/L   Glucose, Bld 146 (H) 65 - 99 mg/dL   BUN 10 6 - 20 mg/dL   Creatinine, Ser <0.30 (L) 0.44 - 1.00 mg/dL   Calcium 9.3 8.9 - 10.3 mg/dL   GFR calc non Af Amer NOT CALCULATED >60 mL/min   GFR calc Af Amer NOT CALCULATED >60 mL/min    Comment: (NOTE) The eGFR has been calculated using the CKD EPI equation. This calculation has not been validated in all clinical situations. eGFR's persistently <60 mL/min signify possible Chronic Kidney Disease.    Anion gap 10 5 - 15   Glucose, capillary     Status: Abnormal   Collection Time: 05/10/16  3:12 AM  Result Value Ref Range   Glucose-Capillary 181 (H) 65 - 99 mg/dL  CBC with Differential/Platelet     Status: None   Collection Time: 05/10/16  7:16 AM  Result Value Ref Range   WBC 7.4 4.0 - 10.5 K/uL   RBC 4.50 3.87 - 5.11 MIL/uL   Hemoglobin 13.3 12.0 - 15.0 g/dL   HCT 39.9 36.0 - 46.0 %   MCV 88.7 78.0 - 100.0 fL   MCH 29.6 26.0 - 34.0 pg   MCHC 33.3 30.0 - 36.0 g/dL   RDW 13.3 11.5 - 15.5 %   Platelets 312 150 - 400 K/uL   Neutrophils Relative % 57 %   Neutro Abs 4.2 1.7 -  7.7 K/uL   Lymphocytes Relative 29 %   Lymphs Abs 2.2 0.7 - 4.0 K/uL   Monocytes Relative 12 %   Monocytes Absolute 0.9 0.1 - 1.0 K/uL   Eosinophils Relative 2 %   Eosinophils Absolute 0.2 0.0 - 0.7 K/uL   Basophils Relative 0 %   Basophils Absolute 0.0 0.0 - 0.1 K/uL    Dg Chest Port 1 View  Result Date: 05/09/2016 CLINICAL DATA:  Acute on chronic respiratory failure. EXAM: PORTABLE CHEST 1 VIEW COMPARISON:  One-view chest x-ray 05/07/2016. FINDINGS: The heart size is exaggerated by low lung volumes. Moderate pulmonary vascular congestion is increasing. Patchy bibasilar airspace disease is noted. There is fluid in the minor fissure. Tracheostomy tube is stable. IMPRESSION: 1. Increasing pulmonary vascular congestion. 2. Patchy bibasilar airspace disease may reflect atelectasis. Lower lobar pneumonia is not excluded. Electronically Signed   By: San Morelle M.D.   On: 05/09/2016 12:24    [id number] except as listed in admit H&P  Blood pressure (!) 148/96, pulse 85, temperature 98.9 F (37.2 C), temperature source Oral, resp. rate (!) 49, height '5\' 5"'$  (1.651 m), weight 47.8 kg (105 lb 6.1 oz), last menstrual period 12/01/2010, SpO2 100 %.  PHYSICAL EXAM: Overall appearance:  Awake and alert, able to nod to answer questions. Head:  Normocephalic, atraumatic. Ears: External ears look healthy. Nose: External nose is  healthy in appearance. Internal nasal exam free of any lesions or obstruction. Oral Cavity/Pharynx:  There are no mucosal lesions or masses identified. Larynx/Hypopharynx: Deferred Neck: No palpable neck masses. Trach in place.  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 We can perform a three layer closure under anesthesia at the patient's convenience. I discussed this with her husband and they would like to wait a few weeks. They may call our office whenever is convenient and we will schedule this procedure. It will be performed under anesthesia and will take about one hour, may be out patient if she is otherwise doing well.  Keontre Defino 05/10/2016, 12:50 PM

## 2016-05-10 NOTE — Discharge Summary (Signed)
Physician Discharge Summary  Patient ID: Judy Spears MRN: [id number] DOB/AGE: [birth year] 65 y.o.  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 Lake Bells at end of May Maintain #4 cuffless trach capped until seen by Dr. Lake Bells  Plan is to change to a [facility] tracheostomy as outpatient next clinic visit pending MD review Continue Augmentin 875 BID for 2 more days Continue guaifenesin  Mobilize as able Suction PRN Continue chest PT efforts  ENT will consider a three layer closure under anesthesia at the patient's convenience.  Pt's husband to call ENT office and can schedule. See Dr. Janeice Robinson note from 4/16.    Discussed respiratory difficulties with family, emergency plans etc  Loose Stool - chronic issue, no fever/WBC or abdominal tenderness  Discharge Plan: Monitor volume / fever  Dysphagia   Discharge Plan: Resume home feeding regimen > TF from 5 pm to 7am with supplemental feeding during the day.   As she progresses, consider bolus feeding or reduction to allow for more oral intake  Hypokalemia  Hypophosphatemia    Discharge Plan: Replace electrolytes as indicated  Consider assessment at time of office follow up if any new symptoms  Hyperglycemia   Discharge Plan: Follow up with PCP regarding glucose control   Encephalopathy  Deconditioning secondary to  Viral Encephalitis (2016)  Discharge Plan: Continue PT efforts at discharge > ok to resume outpatient PT as of 4/23 Continue valacyclovir  Continue home anti-spasticity regimen  Hypertension   Discharge Plan: Continue home amlodipine                   DISCHARGE SUMMARY   History of Present Illness: 66 year old female with past medical history significant for encephalitis due to [place] virus In December 2016 which has been complicated by respiratory failure requiring tracheostomy, R-VC paralysis, and recurrent respiratory infections. She was recently admitted in December 2017 with acute respiratory failure secondary to Pseudomonas and MSSA pneumonia. At that point she had been decannulated from her tracheostomy, however she required repeat mechanical ventilation and tracheostomy placement. She had improved and underwent rehabilitation for several months. 4/11 she was seen in the trach clinic and had been doing very well and had been for several months, therefore her tracheostomy was removed. On 4/12 she presented to [facility] emergency department with complaints of fever, purulent mucus production, and cough for about 4-6 hours. In the emergency department she was started on supplemental oxygen requiring 2 L to maintain sats in the low 90s. She was also started on broad-spectrum antibiotics including Zosyn and vancomycin. Chest x-ray largely reflected known fibrotic changes  Laboratory evaluation was significant for WBC 18.8. She was admitted to the pulmonary/critical care service.  Hospital Course:  She slowly improved with antibiotics, chest PT, and good pulmonary hygiene. Cough mechanics were weak due to poor stoma closure. There was some consideration of having this surgically closed, but ultimately the decision was made to replace the #4 cuffless trach. She continued to make progress with the above therapies and was able to work with physical therapy who recommend outpatient 24 hour  assistance, which the patient already has in place. She was evaluated by ENT who believe she will be a candidate for stoma closure as an outpatient. Tracheostomy was left capped during admission.  4/17 she was deemed a candidate for discharge to home care with continued antibiotics.              CULTURES: Blood 4/12 > Sputum 4/12 >  ANTIBIOTICS: Cefepime 4/12 > 4/13 Zosyn 4/13 > 4/15 Vancomycin 4/12 > 5/15 Amoxicillin 4/15 > 4/16 Augmentin 4/16 >    CONSULTS Dr. Izora Gala - Otolaryngology   TUBES / LINES #4 Tracheostomy >>   Discharge Exam: General:  Chronically ill appearing female in NAD HEENT: MM pink/moist, #4 trach midline c/d/i PSY: calm Neuro: awake, alert, makes eye contact, communicates needs, rolling tremor of upper extremities CV: s1s2 rrr, no m/r/g PULM: even/non-labored, lungs bilaterally clear NK:NLZJ, non-tender, bsx4 active  Extremities: warm/dry, no edema  Skin: no rashes or lesions   Vitals:   05/11/16 0417 05/11/16 0500 05/11/16 0753 05/11/16 0809  BP: 133/78  (!) 143/89 (!) 143/89  Pulse: 87  96 87  Resp: (!) 40  (!) 36 (!) 21  Temp:   98.1 F (36.7 C)   TempSrc:   Axillary   SpO2: 95%  100% 98%  Weight:  105 lb 9.6 oz (47.9 kg)    Height:         Discharge Labs  BMET  Recent Labs Lab 05/07/16 0204  05/08/16 0414 05/08/16 0805 05/08/16 1649 05/09/16 0215 05/09/16 1144 05/10/16 0206  NA 135  --   --  131*  --  136 134* 135  K 2.8*  --   --  2.6*  --  2.9* 3.7 3.2*  CL 101  --   --  96*  --  100* 98* 97*  CO2 26  --   --  26  --  '28 28 28  '$ GLUCOSE 113*  --   --  169*  --  157* 108* 146*  BUN 7  --   --  7  --  '8 7 10  '$ CREATININE 0.38*  --   --  <0.30*  --  <0.30* <0.30* <0.30*  CALCIUM 8.7*  --   --  8.4*  --  8.6* 8.7* 9.3  MG 1.5*  < > 1.6*  --  1.7 1.7 2.0 2.0  PHOS 3.2  < > 1.6*  --  1.2* 1.4* 3.5 2.9  < > = values in this interval not displayed.  CBC  Recent Labs Lab 05/08/16 0806 05/09/16 0215 05/10/16 0716   HGB 12.2 12.5 13.3  HCT 37.0 37.3 39.9  WBC 11.7* 10.0 7.4  PLT 224 239 312    Anti-Coagulation No results for input(s): INR in the last 168 hours.  Discharge Instructions    Call MD for:  difficulty breathing, headache or visual disturbances    Complete by:  As directed    Call MD for:  extreme fatigue    Complete by:  As directed    Call MD for:  hives    Complete by:  As directed    Call MD for:  persistant dizziness or light-headedness    Complete by:  As directed    Call MD for:  persistant nausea and vomiting    Complete by:  As directed    Call MD for:  redness, tenderness, or signs of infection (pain, swelling, redness, odor or green/yellow discharge around incision site)    Complete by:  As directed    Call MD for:  severe uncontrolled pain    Complete by:  As directed    Call MD for:  temperature >100.4    Complete by:  As directed    Discharge instructions    Complete by:  As directed    1.  Review home medications carefully as they have changed 2.  Follow up with Dr. Kendrick Fries / Rubye Oaks, NP as arranged  3.  Ok to resume outpatient PT next week (4/23)  4.  Resume home feeding regimen as previously prescribed (TF from 5pm to 7am with supplemental feeding during the day) 5.  Suction as needed.  If any issues with breathing activate 911 or return to ER immediately if concerns  6.  Trach care daily and as needed 7.  PEG care daily and as needed   Increase activity slowly    Complete by:  As directed        Follow-up Information    Serena Colonel, MD. Call.   Specialty:  Otolaryngology Why:  call to schedule out patient follow up Contact information: [address] [place] [address] [phone]        Rubye Oaks, NP Follow up on 05/25/2016.   Specialty:  Pulmonary Disease Why:  Appt at 3:30 PM Contact information: [address] [place] [address] [phone]        Max Fickle, MD Follow up on 06/18/2016.    Specialty:  Pulmonary Disease Why:  Appt at 3:00 PM Contact information: [address] [place] [address] [phone]            Allergies as of 05/11/2016      Reactions   Cefepime Other (See Comments)   Status epilepticus   Tetracyclines & Related Photosensitivity, Other (See Comments)   Blisters   Pollen Extract Other (See Comments)   Running nose      Medication List    TAKE these medications   amantadine 50 MG/5ML solution Commonly known as:  SYMMETREL Place 15 mLs (150 mg total) into feeding tube 2 (two) times daily. In am and at noon   amLODipine 5 MG tablet Commonly known as:  NORVASC Place 1 tablet (5 mg total) into feeding tube daily.   amoxicillin-clavulanate 400-57 MG/5ML suspension Commonly known as:  AUGMENTIN Place 10 mLs into feeding tube every 12 (twelve) hours.   atorvastatin 20 MG tablet Commonly known as:  LIPITOR Place 20 mg into feeding tube at bedtime.   baclofen 10 MG tablet Commonly known as:  LIORESAL Place 1 tablet (10 mg total) into feeding tube every 6 (six) hours.   chlorhexidine gluconate (MEDLINE KIT) 0.12 % solution Commonly known as:  PERIDEX 15 mLs by Mouth Rinse route 2 (two) times daily. What changed:  how much to take   COQ10 PO Take 10 mLs by mouth at bedtime.   diazepam 2 MG tablet Commonly known as:  VALIUM Place 1 tablet (2 mg total) into feeding tube  at bedtime.   docusate 60 MG/15ML syrup Commonly known as:  COLACE Place 60 mg into feeding tube daily. Tales once daily, but can take 1 addt'l dose as needed for constipation   famotidine 20 MG tablet Commonly known as:  PEPCID Take 1 tablet (20 mg total) by mouth 2 (two) times daily.   fluticasone 50 MCG/ACT nasal spray Commonly known as:  FLONASE Place 1 spray into both nostrils 2 (two) times daily.   free water Soln Place 150 mLs into feeding tube See admin instructions. Takes 162m 5 times daily, if over 2060mhold water and recheck within an hour    Gerhardt's butt cream Crea Apply 1 application topically 4 (four) times daily.   guaiFENesin 100 MG/5ML Soln Commonly known as:  ROBITUSSIN Place 10 mLs (200 mg total) into feeding tube 4 (four) times daily.   loratadine 10 MG tablet Commonly known as:  CLARITIN Place 1 tablet (10 mg total) into feeding tube daily as needed for allergies. What changed:  when to take this   Melatonin 3 MG Tabs Place 6 mg into feeding tube every evening.   multivitamin Liqd Place 15 mLs into feeding tube daily.   nystatin powder Commonly known as:  MYCOSTATIN/NYSTOP Apply 1 g topically 2 (two) times daily as needed. What changed:  reasons to take this   potassium chloride 20 MEQ/15ML (10%) Soln Place 15 mLs (20 mEq total) into feeding tube daily.   PROBIOTIC ADVANCED Caps Take 1 capsule by mouth daily.   prochlorperazine 5 MG tablet Commonly known as:  COMPAZINE Place 1-2 tablets (5-10 mg total) into feeding tube every 6 (six) hours as needed for nausea.   simethicone 40 MG/0.6ML drops Commonly known as:  MYLICON Place 0.6 mLs (40 mg total) into feeding tube 4 (four) times daily as needed for flatulence.   valACYclovir 500 MG tablet Commonly known as:  VALTREX Place 500 mg into feeding tube every morning.         Disposition:  Home with outpatient PT   Discharged Condition: KaLUCIANA CAMMARATAas met maximum benefit of inpatient care and is medically stable and cleared for discharge.  Patient is pending follow up as above.      Time spent on disposition:  40 minutes   Signed:  BrNoe GensNP-C [facility] Pgr: [phone] or if no answer [phone]/17/2018, 10:26 AM

## 2016-05-10 NOTE — Progress Notes (Signed)
CM reviewed notes for LOS report; Aneta Mins [phone]

## 2016-05-10 NOTE — Telephone Encounter (Signed)
I called Judy Spears on all listed numbers but had to leave a message. I asked him to call the office to let us know how he can be reached so I can give an update.

## 2016-05-11 ENCOUNTER — Encounter: Payer: BC Managed Care – PPO | Admitting: Occupational Therapy

## 2016-05-11 ENCOUNTER — Encounter: Payer: Self-pay | Admitting: Gastroenterology

## 2016-05-11 ENCOUNTER — Encounter: Payer: Self-pay | Admitting: Pulmonary Disease

## 2016-05-11 ENCOUNTER — Ambulatory Visit: Payer: BC Managed Care – PPO | Admitting: Physical Therapy

## 2016-05-11 DIAGNOSIS — J189 Pneumonia, unspecified organism: Secondary | ICD-10-CM

## 2016-05-11 LAB — CULTURE, BLOOD (ROUTINE X 2)
CULTURE: NO GROWTH
CULTURE: NO GROWTH
SPECIAL REQUESTS: ADEQUATE
SPECIAL REQUESTS: ADEQUATE

## 2016-05-11 MED ORDER — FAMOTIDINE 20 MG PO TABS: 20.0000 mg | ORAL_TABLET | Freq: Two times a day (BID) | ORAL | 3 refills | Status: DC

## 2016-05-11 MED ORDER — ADULT MULTIVITAMIN LIQUID CH: 15.0000 mL | Freq: Every day | ORAL | 3 refills | Status: DC

## 2016-05-11 MED ORDER — AMOXICILLIN-POT CLAVULANATE 400-57 MG/5ML PO SUSR
10.0000 mL | Freq: Two times a day (BID) | ORAL | 0 refills | Status: AC
Start: 2016-05-11 — End: 2016-05-13

## 2016-05-11 NOTE — Care Management Note (Signed)
Case Management Note  Patient Details  Name: Judy Spears MRN: [id number] Date of Birth: May 28, 1950  Subjective/Objective:  Presented with resp failure secondary to HCAP, old trach, dysphagia, hypokalemia, hypophosphatemia, hyperglycemia, encephalopathy, viral encephalitis, htn.  Has 24 hour care at home in place, patient dc to home.                Action/Plan:   Expected Discharge Date:  05/11/16               Expected Discharge Plan:  Home/Self Care  In-House Referral:     Discharge planning Services  CM Consult  Post Acute Care Choice:    Choice offered to:     DME Arranged:    DME Agency:     HH Arranged:    HH Agency:     Status of Service:  Completed, signed off  If discussed at [employer] of Stay Meetings, dates discussed:    Additional Comments:  Zenon Mayo, RN 05/11/2016, 12:47 PM

## 2016-05-11 NOTE — Progress Notes (Signed)
[facility]  Patient seen and examined Suctioned overnight x2, minimal output On room air Slept well  On exam Vitals:   05/10/16 2239 05/11/16 0343 05/11/16 0417 05/11/16 0500  BP: 120/84 133/78 133/78   Pulse:  85 87   Resp: (!) 30 (!) 26 (!) 40   Temp: 98.2 F (36.8 C) 98.5 F (36.9 C)    TempSrc: Axillary Axillary    SpO2:  95% 95%   Weight:    47.9 kg (105 lb 9.6 oz)  Height:       Lungs clear, normal effort Wakes easily, interactive, nods head appropriately, speaking Belly with positive bowel sounds, nontneder  Impression HCAP Deconditioning Viral encephalitis Tracheostomy state  Plan: Home PT Go home with #4 cuffless tracheostomy with routine trach changes and care as per pre hospitalization plan (prior to decanulation last week); for now will maintain this trach as family familiar with it Suction q12hrs and prn for the next week, then prn Augmentin: complete two more days then stop Continue pro-biotic Follow up with [facility] office NP in 1-2 weeks Follow up with [facility] 4 weeks (call me if need to overbook) No plans for decanulation right now, I will determine with patient and family role for tracheostomy stoma closure in the next few weeks  Roselie Awkward, MD [facility] Pager: [phone] Cell: [phone] After 3pm or if no response, call [phone]

## 2016-05-11 NOTE — Progress Notes (Signed)
Pt education completed to include future appointments, current prescriptions and medications, and doctors discharge instructions. Pt alert and oriented, vital signs stable. Pt discharged via personal wheel chair with nursing staff and family. Temp: 98.1 F (36.7 C) (04/17 0753) Temp Source: Axillary (04/17 0753) BP: 143/89 (04/17 0809) Pulse Rate: 87 (04/17 0809)  Wray Kearns 05/11/2016 11:36 AM

## 2016-05-13 ENCOUNTER — Telehealth: Payer: Self-pay | Admitting: Pulmonary Disease

## 2016-05-13 NOTE — Telephone Encounter (Signed)
Spoke with patient husband, aware of rec's per BQ. Nothing further needed.

## 2016-05-13 NOTE — Telephone Encounter (Signed)
Spoke with the pt's spouse  He states pt has 1 dose remaining of augmentin, but they want to d/c it now due to c/o diarrhea  He states they are going out of town tomorrow  If okay to stop it now, do you want her to finish the last dose when she returns back to town in a few days? Please advise, thanks!

## 2016-05-13 NOTE — Telephone Encounter (Signed)
LMTCB

## 2016-05-13 NOTE — Telephone Encounter (Signed)
OK to stop Watch for fever, belly pain, chills; if any of those then she needs to be checked out Continue probiotic

## 2016-05-14 ENCOUNTER — Ambulatory Visit: Admit: 2016-05-14 | Payer: BC Managed Care – PPO | Admitting: Otolaryngology

## 2016-05-14 SURGERY — TRACHEAL ESOPHAGEAL FISTULA REPAIR
Anesthesia: General

## 2016-05-17 ENCOUNTER — Ambulatory Visit: Payer: BC Managed Care – PPO

## 2016-05-17 ENCOUNTER — Ambulatory Visit: Payer: BC Managed Care – PPO | Admitting: Occupational Therapy

## 2016-05-17 ENCOUNTER — Encounter: Payer: Self-pay | Admitting: Occupational Therapy

## 2016-05-17 ENCOUNTER — Ambulatory Visit: Payer: BC Managed Care – PPO | Admitting: Physical Therapy

## 2016-05-17 ENCOUNTER — Other Ambulatory Visit: Payer: Self-pay | Admitting: Physical Medicine & Rehabilitation

## 2016-05-17 DIAGNOSIS — R471 Dysarthria and anarthria: Secondary | ICD-10-CM | POA: Diagnosis present

## 2016-05-17 DIAGNOSIS — R2681 Unsteadiness on feet: Secondary | ICD-10-CM | POA: Diagnosis not present

## 2016-05-17 DIAGNOSIS — R41844 Frontal lobe and executive function deficit: Secondary | ICD-10-CM

## 2016-05-17 DIAGNOSIS — R293 Abnormal posture: Secondary | ICD-10-CM | POA: Diagnosis present

## 2016-05-17 DIAGNOSIS — R2689 Other abnormalities of gait and mobility: Secondary | ICD-10-CM | POA: Diagnosis present

## 2016-05-17 DIAGNOSIS — M6281 Muscle weakness (generalized): Secondary | ICD-10-CM

## 2016-05-17 DIAGNOSIS — R41841 Cognitive communication deficit: Secondary | ICD-10-CM | POA: Diagnosis present

## 2016-05-17 DIAGNOSIS — M25612 Stiffness of left shoulder, not elsewhere classified: Secondary | ICD-10-CM | POA: Diagnosis present

## 2016-05-17 DIAGNOSIS — R1312 Dysphagia, oropharyngeal phase: Secondary | ICD-10-CM | POA: Diagnosis present

## 2016-05-17 DIAGNOSIS — M25622 Stiffness of left elbow, not elsewhere classified: Secondary | ICD-10-CM | POA: Diagnosis present

## 2016-05-17 DIAGNOSIS — R278 Other lack of coordination: Secondary | ICD-10-CM | POA: Diagnosis present

## 2016-05-17 DIAGNOSIS — R29818 Other symptoms and signs involving the nervous system: Secondary | ICD-10-CM

## 2016-05-17 DIAGNOSIS — M25611 Stiffness of right shoulder, not elsewhere classified: Secondary | ICD-10-CM | POA: Diagnosis present

## 2016-05-17 NOTE — Therapy (Signed)
[facility] [address] [place], [place], [address] Phone: [phone]   Fax:  [phone]  Physical Therapy Treatment  Patient Details  Name: Judy Spears MRN: [id number] Date of Birth: 04/12/1950 Referring Provider: Alger Simons, MD/Andrew Letta Pate, MD  Encounter Date: 05/17/2016      PT End of Session - 05/17/16 2139    Visit Number 20   Number of Visits 36   Date for PT Re-Evaluation 06/29/16   Authorization Type BCBS   PT Start Time 2355   PT Stop Time 1446   PT Time Calculation (min) 41 min      Past Medical History:  Diagnosis Date  . Arthritis    lt great toe  . Complication of anesthesia    pt has had headaches post op-did advise to hydra well preop  . Hair loss    right-sided  . History of exercise stress test    a. ETT (12/15) with 12:00 exercise, no ST changes, occasional PACs.   . Hyperlipidemia   . Insomnia   . Migraine headache   . 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 Postmenopausal   . Premature atrial contractions    a. Holter (12/15) with 8% PACs, no atrial runs or atrial fibrillation.     Past Surgical History:  Procedure Laterality Date  . BREAST BIOPSY  01/01/2011   Procedure: BREAST BIOPSY WITH NEEDLE LOCALIZATION;  Surgeon: Rolm Bookbinder, MD;  Location: [facility];  Service: General;  Laterality: Left;  left breast wire localization biopsy  . cataracts right eye    . CESAREAN SECTION  1986  . HERNIA REPAIR  2000   RIH  . opirectinal membrane peel    . RHINOPLASTY  1983    There were no vitals filed for this visit.      Subjective Assessment - 05/17/16 2123    Subjective Pt had trach removed on 05-05-16; developed a fever and was admitted to [facility] on 05-06-16 with pneumonia: underwent tracheostomy change on 05-08-16   Patient is accompained by: Family member   Pertinent History Precautions:  PEG.  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  Another round of botox in LLE on 02/06/16   Limitations House hold activities;Walking;Standing;Sitting   Patient Stated Goals Per husband, "For Judy Spears to be as independent as possible."   Currently in Pain? No/denies                         [facility] Adult PT Treatment/Exercise - 05/17/16 1405      Transfers   Transfers Sit to Stand   Sit to Stand 3: Mod assist   Sit to Stand Details Tactile cues for placement;Verbal cues for technique;Manual facilitation for weight shifting;Verbal cues for gait pattern     Ambulation/Gait   Ambulation/Gait Yes   Ambulation/Gait Assistance 4: Min assist   Ambulation Distance (Feet) 100 Feet  50', 35'   Assistive device Other (Comment)  Rifton Gait trainer   Gait Pattern Decreased stance time - right;Decreased hip/knee flexion - right;Decreased hip/knee flexion - left;Decreased weight shift to right;Scissoring;Ataxic   Ambulation Surface Level;Indoor     NeuroRe-ed: weight shifting activity with pt standing inside Rifton Pacer gait trainer - mirror used for visual feedback Used balance bubbles for  targets with pt touching each one 10 reps with each foot with min assist - decreased coordination With ataxic movement noted             PT Short Term Goals - 05/01/16 0946      PT SHORT TERM GOAL #1   Title Pt will consistently perform supine rolling in B directions at S level (with rails as needed) in order to indicate improved trunk dissociation and increaed independence with bed mobility.  (Target Date for STGs: 05/28/16)   Time 4   Period Weeks   Status New     PT SHORT TERM GOAL #2   Title Pt will consistently perform R SL>sit at mod A level in order to indicate increased independence with getting out of bed.     Time 4   Period Weeks    Status New     PT SHORT TERM GOAL #3   Title Pt will consistently perform L SL>sit at min A level in order to indicate increased independene with getting out of bed/off of exercise mat table.    Time 4   Period Weeks   Status New     PT SHORT TERM GOAL #4   Title Pt will consistently perform sit to SL R and L at mod A level in order to indicate improved independence with bed mobility.     Time 4   Period Weeks     PT SHORT TERM GOAL #5   Title Pt will perform transfers from w/c to seated surface at min/guard level in order to indicate improved independence with transfers.    Time 4   Period Weeks   Status New     PT SHORT TERM GOAL #6   Title Pt will perform static standing with BUE support x 3 mins at S level with no overt LOB to decrease burden of care and increase independence with ADLs.    Time 4   Period Weeks   Status New     PT SHORT TERM GOAL #7   Title Pt will perform dynamic sitting at [facility] with intermittent UE support, reaching outside of BOS x 5 mins in order to indicate safety with ADLs.    Time 4   Period Weeks   Status New           PT Long Term Goals - 05/01/16 0953      PT LONG TERM GOAL #1   Title Pt will perform supine rolling R and L at mod I level in order to indicate improved independence with bed mobility.  (Target Date: 06/25/16)   Time 8   Period Weeks   Status New     PT LONG TERM GOAL #2   Title Pt will perform sit>SL R or L at min A level in order to indicate improved independence with getting into bed.    Time 8   Period Weeks   Status New     PT LONG TERM GOAL #3   Title Pt will perform SL R to sit at min A level, SL L to sit at S level in order to indicate increased independence with getting out of bed.     Time 8   Period Weeks   Status New     PT LONG TERM GOAL #4   Title Pt will perform transfers at S level 50% of the time in order to indicate improved functional independence.    Time 8   Period Weeks  Status New     PT  LONG TERM GOAL #5   Title Pt will be able to stand with single UE support while performing opposite UE task x 3 mins at S level in order to indicate improved independence with ADLs.    Time 8   Period Weeks   Status New     Additional Long Term Goals   Additional Long Term Goals Yes     PT LONG TERM GOAL #6   Title Pt will demonstrate ability to ambulate up to 25' w/ LRAD at min A level in order to assist with getting around home.     Time 8   Period Weeks   Status New               Plan - 05/17/16 2141    Clinical Impression Statement Rifton gait trainer used for first time with gait training; pt's Rt foot noted to supinate more today due to incr. tone and tightness; ankle hugger used to keep RLE abducted during swing phase to prevent scissoring; pt able to perform sit to stand transfer from seat of gait trainer with supervision x 1 rep   Rehab Potential Fair   Clinical Impairments Affecting Rehab Potential Chronicity of condition, continued medical management, but has very strong family and financial support   PT Frequency 2x / week   PT Duration 8 weeks   PT Treatment/Interventions ADLs/Self Care Home Management;DME Instruction;Gait training;Stair training;Functional mobility training;Therapeutic activities;Patient/family education;Cognitive remediation;Neuromuscular re-education;Therapeutic exercise;Balance training;Orthotic Fit/Training;Wheelchair mobility training;Manual techniques;Splinting;Visual/perceptual remediation/compensation;Vestibular;Passive range of motion;Electrical Stimulation   PT Next Visit Plan continue standing/ gait training/weight shifting activities   Consulted and Agree with Plan of Care Patient;Family member/caregiver   Family Member Consulted caregiver, [name] and husband [name]      Patient will benefit from skilled therapeutic intervention in order to improve the following deficits and impairments:  Abnormal gait, Decreased balance, Decreased  endurance, Decreased cognition, Cardiopulmonary status limiting activity, Decreased activity tolerance, Decreased coordination, Decreased strength, Impaired flexibility, Impaired tone, Decreased mobility, Increased muscle spasms, Impaired vision/preception, Decreased range of motion, Impaired UE functional use, Postural dysfunction, Improper body mechanics, Impaired perceived functional ability, Decreased knowledge of precautions, Decreased knowledge of use of DME  Visit Diagnosis: Other abnormalities of gait and mobility     Problem List Patient Active Problem List   Diagnosis Date Noted  . Heterotopic ossification of bone 03/10/2016  . Acute on chronic respiratory failure with hypoxia ([place])   . Acute hypoxemic respiratory failure ([place])   . Acute blood loss anemia   . Atelectasis 02/04/2016  . Debility 02/03/2016  . Pneumonia of both lower lobes due to Pseudomonas species ([place])   . Pneumonia of both lower lobes due to methicillin susceptible Staphylococcus aureus (MSSA) ([place])   . Acute respiratory failure with hypoxia ([place])   . Acute encephalopathy   . Seizures ([place])   . Sepsis ([place])   . Hypoxia   . Pain   . HCAP (healthcare-associated pneumonia) 12/28/2015  . Chronic respiratory failure ([place]) 12/28/2015  . Acute tracheobronchitis 12/24/2015  . Tracheostomy tube present ([place])   . Tracheal stenosis   . Chronic respiratory failure with hypoxia ([place])   . 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[place]) 10/20/2015  . Tracheostomy status ([place]) 09/26/2015  . Allergic rhinitis 09/26/2015  . 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 Movement disorder 09/22/2015  . Encephalitis 09/22/2015  . Chest pain 02/07/2014  . Premature atrial contractions 01/13/2014  . Abnormality of gait 12/04/2013  . Hyperlipidemia 12/21/2011  . Cardiovascular  risk factor 12/21/2011  . Bunion 01/31/2008  . Metatarsalgia of both feet 09/20/2007  . FLAT FOOT 09/20/2007  . HALLUX RIGIDUS, ACQUIRED 09/20/2007    Alda Lea,  PT 05/17/2016, 9:51 PM  [facility] [address] [place], [place], [address] Phone: [phone]   Fax:  [phone]  Name: MACAYLA EKDAHL MRN: [id number] Date of Birth: [birth year]

## 2016-05-17 NOTE — Therapy (Signed)
[facility] [address] [place], [place], [address] Phone: [phone]   Fax:  [phone]  Occupational Therapy Treatment  Patient Details  Name: Judy Spears MRN: [id number] Date of Birth: 07-06-1950 Referring Provider: Dr. Quita Skye. [name]  Encounter Date: 05/17/2016      OT End of Session - 05/17/16 1547    Visit Number 19   Number of Visits 32   Date for OT Re-Evaluation 04/26/16   Authorization Type BCBS unlimited visits   OT Start Time 2119   OT Stop Time 1359   OT Time Calculation (min) 42 min   Activity Tolerance Patient tolerated treatment well      Past Medical History:  Diagnosis Date  . Arthritis    lt great toe  . Complication of anesthesia    pt has had headaches post op-did advise to hydra well preop  . Hair loss    right-sided  . History of exercise stress test    a. ETT (12/15) with 12:00 exercise, no ST changes, occasional PACs.   . Hyperlipidemia   . Insomnia   . Migraine headache   . 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 Postmenopausal   . Premature atrial contractions    a. Holter (12/15) with 8% PACs, no atrial runs or atrial fibrillation.     Past Surgical History:  Procedure Laterality Date  . BREAST BIOPSY  01/01/2011   Procedure: BREAST BIOPSY WITH NEEDLE LOCALIZATION;  Surgeon: Rolm Bookbinder, MD;  Location: [facility];  Service: General;  Laterality: Left;  left breast wire localization biopsy  . cataracts right eye    . CESAREAN SECTION  1986  . HERNIA REPAIR  2000   RIH  . opirectinal membrane peel    . RHINOPLASTY  1983    There were no vitals filed for this visit.      Subjective Assessment - 05/17/16 1320    Subjective  I feel better (pt with MD clearance to return to therapy after brief hospitalization for pneumonia   Patient is accompained by: Family member  husband [name], caregiver [name]   Pertinent History see epic snapshot -  ABI/encephalitis/hypoxia from virus; recent hospitalization due to ARF;    Patient Stated Goals Pt unable to state.    Currently in Pain? No/denies                      OT Treatments/Exercises (OP) - 05/17/16 0001      Neurological Re-education Exercises   Other Exercises 2 Neuro re ed to address functional transfers with emphasis on leaning forward, pushing with her LE's instead of pulling with UE's, and squat pivot.  Pt has been in the hospital for pnuemonia after trach removal (trach now replaced and plugged) - pt cleared from MD to return; pt very stiff today from being in bed for several days therefore required mod a for transfer. Focused on trunk mobilty, A/P tilts with rotation, and forward translation of weight in sitting with erect trunk and thoracic extension - pt needs  mod facilitation and mod vc's.  Progressed to sit to stand again with emphasis on thoracic extension and dropping R shoulder, head alignment and improved trunk alignment.  Also focused on pt's ability to actively shift weight laterally through her hips - pt with significantly improved ability to do this.  Also addressed active stand to sit as well as static standing balance without UE suppot - pt  able to hold for approximately 5 seconds by end of session (iniitally required min a for static standing).                    OT Short Term Goals - 05/17/16 1542      OT SHORT TERM GOAL #1   Title Pt, husband and cargiver will be mod I with HEP - 03/29/2016   Status Achieved     OT SHORT TERM GOAL #2   Title Pt will be able to sit with supervision without UE support and vc's to assist with midline 75% while engage in functional task   Status Achieved     OT SHORT TERM GOAL #3   Title Pt will be able to complete sit to stand consistently with mod A for toilet transfers (max a for pivot)   Status Achieved     OT SHORT TERM GOAL #4   Title Pt, husband and caregiver will verbalize understanding of  various bathroom accomodations to reduce long term caregiver burden   Status Achieved     OT SHORT TERM GOAL #5   Title Pt will be able to stand step transfer to toilet with mo more than mod a, mod vc's consistently using grab bar for community toileting needs- 06/10/2016 date adjusted as pt missed 2 weeks due to hospitalization   Status On-going     OT SHORT TERM GOAL #6   Title Pt will be able to wash face with supervision/max cues 50% of the time, min a 50% of the time.    Status On-going     OT SHORT TERM GOAL #7   Title Pt will be able to don pull over shirt with mod a, max cues   Status On-going     OT SHORT TERM GOAL #8   Title Pt will be able to maintain static standing with UE support with close supervision to reduce burden of care for caregivers    Status On-going           OT Long Term Goals - 05/17/16 1544      OT LONG TERM GOAL #1   Title Pt, husband and caregiver will be mod I with upgraded HEP prn - 04/26/2016   Status Achieved     OT LONG TERM GOAL #2   Title Pt, husband and caregiver will be mod I with splint wear and care    Status Achieved     OT LONG TERM GOAL #3   Title Pt will be able to wash face with min a only   Status Achieved     OT LONG TERM GOAL #4   Title Pt will be able to sit without UE support with supevision 100% of the time with no more than 2 vc's while engaged in functional activity.   Status Achieved     OT LONG TERM GOAL #5   Title Pt, husband and caregiver will verbalize understanding of potential after care therapeutic opportunities. - 07/08/2016 date adjusted as pt missed 2 weeks due to hospitalization   Status On-going     OT LONG TERM GOAL #6   Title Pt will be supervision to wash face consistently   Status On-going     OT LONG TERM GOAL #7   Title Pt will don shirt with min a and max vc's 50% of the time   Status On-going     OT LONG TERM GOAL #8   Title Pt will require min a for  stand step transfers to toilet using grab  bar for community toileting at least 75% of the time.   Status On-going     OT LONG TERM GOAL  #9   Baseline Pt will stand with contact gurard with only 1 UE support for static standing balance in prep for abiilty to engage in functional task while standing.    Status On-going               Plan - 05/17/16 1545    Clinical Impression Statement Pt returns after misisng 2 weeks due to hospitalization for pneumonia - pt with medical release allowing her to return to therapy. Pt working toward current goals.    Rehab Potential Fair   Clinical Impairments Affecting Rehab Potential severity of deficits, very slow rate of recovery   OT Frequency 2x / week   OT Duration 8 weeks   OT Treatment/Interventions Self-care/ADL training;Ultrasound;Moist Heat;Electrical Stimulation;DME and/or AE instruction;Neuromuscular education;Therapeutic exercise;Functional Mobility Training;Manual Therapy;Passive range of motion;Splinting;Therapeutic activities;Balance training;Patient/family education;Visual/perceptual remediation/compensation;Cognitive remediation/compensation   Plan NMR for functional use of UE's, static to dynamic standing balance, postural control, NMR for trunk control, functional mobility   Consulted and Agree with Plan of Care Patient;Family member/caregiver   Family Member Consulted caregiver [name], husband [name]       Patient will benefit from skilled therapeutic intervention in order to improve the following deficits and impairments:  Decreased endurance, Decreased skin integrity, Impaired vision/preception, Improper body mechanics, Impaired perceived functional ability, Decreased knowledge of precautions, Cardiopulmonary status limiting activity, Decreased activity tolerance, Decreased knowledge of use of DME, Decreased strength, Impaired flexibility, Improper spinal/pelvic alignment, Impaired sensation, Difficulty walking, Decreased mobility, Decreased balance, Decreased cognition,  Decreased range of motion, Impaired tone, Impaired UE functional use, Decreased safety awareness, Decreased coordination, Pain  Visit Diagnosis: Unsteadiness on feet  Abnormal posture  Muscle weakness (generalized)  Other symptoms and signs involving the nervous system  Stiffness of right shoulder, not elsewhere classified  Stiffness of left shoulder, not elsewhere classified  Stiffness of left elbow, not elsewhere classified  Frontal lobe and executive function deficit    Problem List Patient Active Problem List   Diagnosis Date Noted  . Heterotopic ossification of bone 03/10/2016  . Acute on chronic respiratory failure with hypoxia ([place])   . Acute hypoxemic respiratory failure ([place])   . Acute blood loss anemia   . Atelectasis 02/04/2016  . Debility 02/03/2016  . Pneumonia of both lower lobes due to Pseudomonas species ([place])   . Pneumonia of both lower lobes due to methicillin susceptible Staphylococcus aureus (MSSA) ([place])   . Acute respiratory failure with hypoxia ([place])   . Acute encephalopathy   . Seizures ([place])   . Sepsis ([place])   . Hypoxia   . Pain   . HCAP (healthcare-associated pneumonia) 12/28/2015  . Chronic respiratory failure ([place]) 12/28/2015  . Acute tracheobronchitis 12/24/2015  . Tracheostomy tube present ([place])   . Tracheal stenosis   . Chronic respiratory failure with hypoxia ([place])   . 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[place]) 10/20/2015  . Tracheostomy status ([place]) 09/26/2015  . Allergic rhinitis 09/26/2015  . 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 Movement disorder 09/22/2015  . Encephalitis 09/22/2015  . Chest pain 02/07/2014  . Premature atrial contractions 01/13/2014  . Abnormality of gait 12/04/2013  . Hyperlipidemia 12/21/2011  . Cardiovascular risk factor 12/21/2011  . Bunion 01/31/2008  . Metatarsalgia of both feet 09/20/2007  . FLAT FOOT 09/20/2007  . HALLUX RIGIDUS, ACQUIRED 09/20/2007    Quay Burow, OTR/L  05/17/2016, 3:49 PM  [facility] [address] [place], [place], [address] Phone: [phone]   Fax:  [phone]  Name: Judy Spears MRN: [id number] Date of Birth: 07/26/1950

## 2016-05-17 NOTE — Therapy (Signed)
[facility] [address] [place], [place], [address] Phone: [phone]   Fax:  [phone]  Speech Language Pathology Treatment  Patient Details  Name: Judy Spears MRN: [id number] Date of Birth: [birth year] Referring Provider: Charlett Blake, MD  Encounter Date: 05/17/2016      End of Session - 05/17/16 1553    Visit Number 20   Number of Visits 33   Date for SLP Re-Evaluation 06/25/16   SLP Start Time 1453   SLP Stop Time  4315   SLP Time Calculation (min) 38 min   Activity Tolerance Patient tolerated treatment well      Past Medical History:  Diagnosis Date  . Arthritis    lt great toe  . Complication of anesthesia    pt has had headaches post op-did advise to hydra well preop  . Hair loss    right-sided  . History of exercise stress test    a. ETT (12/15) with 12:00 exercise, no ST changes, occasional PACs.   . Hyperlipidemia   . Insomnia   . Migraine headache   . 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 Postmenopausal   . Premature atrial contractions    a. Holter (12/15) with 8% PACs, no atrial runs or atrial fibrillation.     Past Surgical History:  Procedure Laterality Date  . BREAST BIOPSY  01/01/2011   Procedure: BREAST BIOPSY WITH NEEDLE LOCALIZATION;  Surgeon: Rolm Bookbinder, MD;  Location: [facility];  Service: General;  Laterality: Left;  left breast wire localization biopsy  . cataracts right eye    . CESAREAN SECTION  1986  . HERNIA REPAIR  2000   RIH  . opirectinal membrane peel    . RHINOPLASTY  1983    There were no vitals filed for this visit.      Subjective Assessment - 05/17/16 1501    Subjective Inaudible greeting to SLP   Patient is accompained by: [name], caregiver   Currently in Pain? No/denies               ADULT SLP TREATMENT - 05/17/16 1501      General Information   Behavior/Cognition Alert;Cooperative;Pleasant  mood;Distractible;Lethargic     Treatment Provided   Treatment provided Cognitive-Linquistic     Cognitive-Linquistic Treatment   Treatment focused on Voice   Skilled Treatment Pt was repositioned during session for optimal breath support and voicing. Pt acheived functional voicing speaking one word at a time of 3-5 word phrases with consistent written, visual, demo, and verbal cues approx 60% of the time, and adeuqate breath 40% of the time with visual and verbal cues consistently. Pt with hydrophonic voice throughout session but unable to adeequately clear with max cues from SLP and caregiver.     Tracheostomy Shiley 4 mm Uncuffed   Tracheostomy Properties Placement Date: 05/08/16 Placement Time: 1030 Placed By: ICU physician Brand: Shiley Size (mm): 4 mm Style: Uncuffed   Status Capped     Assessment / Recommendations / Plan   Plan Continue with current plan of care     Progression Toward Goals   Progression toward goals Progressing toward goals            SLP Short Term Goals - 05/17/16 1556      SLP SHORT TERM GOAL #1   Title Patient will utilize intelligibility strategies with moderate visual cues from communication partner/visual aid to communicate 5-7 word wants, needs,  thoughts and ideas.   Baseline 03/15/16, 04/05/16, 04/12/16, 04/19/16   Time 2   Period Weeks   Status Revised     SLP SHORT TERM GOAL #2   Title Patient will utilize intelligibility strategies, breath support with mod A for phrase repetition in 75% of trials.   Baseline 2.12.18, 04/05/16, 04/08/16, 04/12/16   Status Achieved     SLP SHORT TERM GOAL #3   Title Pt will swallow thin liquids, purees and soft solids in 9/10 trials with no overt signs of aspiration.   Baseline 2.12.18, 03-12-16, 03-15-16   Status Achieved     SLP SHORT TERM GOAL #4   Title Patient will tolerate 6 oz pleasure feeds of pureed solids and/or thin liquids, >95% with no overt signs of aspiration.   Baseline Husband reports pt ate "a  whole bowl of soup" with no overt s/sx aspiration   Time 2   Period Weeks   Status Revised     SLP SHORT TERM GOAL #5   Title Patient will utilize verbal cues for lip closure, dry swallow for secretion management in 75% of opportunities.   Time 2   Period Weeks   Status On-going          SLP Long Term Goals - 05/17/16 1557      SLP LONG TERM GOAL #1   Title Patient will utilize multimodal communication/AAC to clarify wants, needs, thoughts or ideas during communication breakdowns with max A   Status Deferred     SLP LONG TERM GOAL #2   Title Patient will utilize intelligibility strategies, breath support with min A visual cues for 5-7 word responses with 80% accuracy.   Time 6   Period Weeks   Status On-going     SLP LONG TERM GOAL #3   Title Patient will demonstrate readiness for MBS to determine safety for pleasure feeds by timely initiation of swallow in 3/5 trials with ice chips/H20.   Baseline 2.12.18   Status Achieved     SLP LONG TERM GOAL #4   Title Patient will adequately masticate and clear soft solids with mod verbal cues and no overt signs of aspiration in 4/5 trials.   Time 6   Period Weeks   Status On-going     SLP LONG TERM GOAL #5   Title Patient will utilize visual cue for lip closure, dry swallow for secretion management in 75% of opportunities.   Time 6   Period Weeks   Status On-going          Plan - 05/17/16 1554    Clinical Impression Statement Patient remained alert and attentive with tactile cues from SLP (somnolent during <5 minutes of session). Pt proved her need for consistent max external cuing for appropriate and adequate voicing as well as for adequate breath support for speech. Recommend cont' d skilled ST in order to maximize functional communication, swallowing safety, reduce caregiver burden and improve quality of life.   Speech Therapy Frequency 2x / week   Duration --  8 weeks   Treatment/Interventions Aspiration precaution  training;Trials of upgraded texture/liquids;Compensatory strategies;Functional tasks;Patient/family education;Cueing hierarchy;Diet toleration management by SLP;Cognitive reorganization;Compensatory techniques;SLP instruction and feedback;Internal/external aids;Multimodal communcation approach   Potential to Achieve Goals Good   Potential Considerations Ability to learn/carryover information;Co-morbidities;Severity of impairments      Patient will benefit from skilled therapeutic intervention in order to improve the following deficits and impairments:   Dysarthria and anarthria  Dysphagia, oropharyngeal phase  Cognitive communication deficit  G-Codes - 05/17/16 1557    Functional Assessment Tool Used NOMS, clinical judgment   Functional Limitations Spoken language expressive   Spoken Language Expression Current Status [phone]) At least 60 percent but less than 80 percent impaired, limited or restricted   Spoken Language Expression Goal Status [phone]) At least 40 percent but less than 60 percent impaired, limited or restricted      Speech Therapy Progress Note  Dates of Reporting Period: 04-29-16 to present  Subjective Statement: Pt has been seen for 20 ST visits focusing on swallowing and expressive language.  Objective Measurements: Pt cont to require cues for both areas of deficit.  Goal Update: See above.  Plan: See above.  Reason Skilled Services are Required: Skilled services remain needed to cont to encourage swallow and expressive verbal communication.   Problem List Patient Active Problem List   Diagnosis Date Noted  . Heterotopic ossification of bone 03/10/2016  . Acute on chronic respiratory failure with hypoxia ([place])   . Acute hypoxemic respiratory failure ([place])   . Acute blood loss anemia   . Atelectasis 02/04/2016  . Debility 02/03/2016  . Pneumonia of both lower lobes due to Pseudomonas species ([place])   . Pneumonia of both lower lobes due to methicillin  susceptible Staphylococcus aureus (MSSA) ([place])   . Acute respiratory failure with hypoxia ([place])   . Acute encephalopathy   . Seizures ([place])   . Sepsis ([place])   . Hypoxia   . Pain   . HCAP (healthcare-associated pneumonia) 12/28/2015  . Chronic respiratory failure ([place]) 12/28/2015  . Acute tracheobronchitis 12/24/2015  . Tracheostomy tube present ([place])   . Tracheal stenosis   . Chronic respiratory failure with hypoxia ([place])   . 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[place]) 10/20/2015  . Tracheostomy status ([place]) 09/26/2015  . Allergic rhinitis 09/26/2015  . 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 Movement disorder 09/22/2015  . Encephalitis 09/22/2015  . Chest pain 02/07/2014  . Premature atrial contractions 01/13/2014  . Abnormality of gait 12/04/2013  . Hyperlipidemia 12/21/2011  . Cardiovascular risk factor 12/21/2011  . Bunion 01/31/2008  . Metatarsalgia of both feet 09/20/2007  . FLAT FOOT 09/20/2007  . HALLUX RIGIDUS, ACQUIRED 09/20/2007    [facility] ,MS, CCC-SLP  05/17/2016, 3:58 PM  [facility] [address] [place], [place], [address] Phone: [phone]   Fax:  [phone]   Name: AERILYNN GOIN MRN: [id number] Date of Birth: 17-Aug-1950

## 2016-05-20 ENCOUNTER — Ambulatory Visit: Payer: BC Managed Care – PPO | Admitting: Rehabilitation

## 2016-05-20 ENCOUNTER — Ambulatory Visit: Payer: BC Managed Care – PPO | Admitting: Speech Pathology

## 2016-05-20 ENCOUNTER — Encounter: Payer: Self-pay | Admitting: Occupational Therapy

## 2016-05-20 ENCOUNTER — Ambulatory Visit: Payer: BC Managed Care – PPO | Admitting: Occupational Therapy

## 2016-05-20 ENCOUNTER — Encounter: Payer: Self-pay | Admitting: Rehabilitation

## 2016-05-20 DIAGNOSIS — R1312 Dysphagia, oropharyngeal phase: Secondary | ICD-10-CM

## 2016-05-20 DIAGNOSIS — M6281 Muscle weakness (generalized): Secondary | ICD-10-CM

## 2016-05-20 DIAGNOSIS — R2681 Unsteadiness on feet: Secondary | ICD-10-CM | POA: Diagnosis not present

## 2016-05-20 DIAGNOSIS — R41841 Cognitive communication deficit: Secondary | ICD-10-CM

## 2016-05-20 DIAGNOSIS — R471 Dysarthria and anarthria: Secondary | ICD-10-CM

## 2016-05-20 DIAGNOSIS — M25622 Stiffness of left elbow, not elsewhere classified: Secondary | ICD-10-CM

## 2016-05-20 DIAGNOSIS — R293 Abnormal posture: Secondary | ICD-10-CM

## 2016-05-20 DIAGNOSIS — M25611 Stiffness of right shoulder, not elsewhere classified: Secondary | ICD-10-CM

## 2016-05-20 DIAGNOSIS — R29818 Other symptoms and signs involving the nervous system: Secondary | ICD-10-CM

## 2016-05-20 DIAGNOSIS — M25612 Stiffness of left shoulder, not elsewhere classified: Secondary | ICD-10-CM

## 2016-05-20 DIAGNOSIS — R41844 Frontal lobe and executive function deficit: Secondary | ICD-10-CM

## 2016-05-20 NOTE — Therapy (Signed)
[facility] [address] [place], [place], [address] Phone: [phone]   Fax:  [phone]  Physical Therapy Treatment  Patient Details  Name: Judy Spears MRN: [id number] Date of Birth: 03/15/1950 Referring Provider: Alger Simons, MD/Andrew Letta Pate, MD  Encounter Date: 05/20/2016      PT End of Session - 05/20/16 1546    Visit Number 21   Number of Visits 36   Date for PT Re-Evaluation 06/29/16   Authorization Type BC[employer]   PT Start Time 1319   PT Stop Time 1402   PT Time Calculation (min) 43 min   Activity Tolerance Patient tolerated treatment well   Behavior During Therapy [facility] for tasks assessed/performed      Past Medical History:  Diagnosis Date  . Arthritis    lt great toe  . Complication of anesthesia    pt has had headaches post op-did advise to hydra well preop  . Hair loss    right-sided  . History of exercise stress test    a. ETT (12/15) with 12:00 exercise, no ST changes, occasional PACs.   . Hyperlipidemia   . Insomnia   . Migraine headache   . 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 Postmenopausal   . Premature atrial contractions    a. Holter (12/15) with 8% PACs, no atrial runs or atrial fibrillation.     Past Surgical History:  Procedure Laterality Date  . BREAST BIOPSY  01/01/2011   Procedure: BREAST BIOPSY WITH NEEDLE LOCALIZATION;  Surgeon: Rolm Bookbinder, MD;  Location: [facility];  Service: General;  Laterality: Left;  left breast wire localization biopsy  . cataracts right eye    . CESAREAN SECTION  1986  . HERNIA REPAIR  2000   RIH  . opirectinal membrane peel    . RHINOPLASTY  1983    There were no vitals filed for this visit.      Subjective Assessment - 05/20/16 1321    Subjective Pts caregiver reports pt seems to move easier now since being home a few days.     Pertinent History Precautions:  PEG.  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  Another round of botox in LLE on 02/06/16   Limitations House hold activities;Walking;Standing;Sitting   Patient Stated Goals Per husband, "For Judy Spears to be as independent as possible."   Currently in Pain? No/denies            NMR:  Began in // bars in standing with pt working on finding midline and equal WB through LEs.  Utilized mirror to do so.  Note pt doing markedly better self correcting head/trunk/shoulders with less cuing than previous sessions.  Once in midline standing, also worked on lateral weight shifting with 5-7 second holds in order for her to feel what being on L vs R LE felt like proprioceptively.  Progressed to forward/retro stepping with R and LLE x 5-10 reps each and finally walking forwards and backwards in // bars x 10' both directions.  Provided forearm assist bilaterally with cues for increased trunk extension and relaxed shoulders.  Note that with retro gait, she tends to increase forward trunk lean, but improved with cuing and increased time to self adjust posture.    Gait:  Utilized "up walker" during session for gait  x 115' with [name], Caregiver providing light assist at front of walker to ensure that it did not get too far ahead of her and from PT posteriorly at rib cage to facilitate trunk extension and upright posture throughout.  Note marked improvement in ability to load LE in stance vs previous sessions.  Pt fluctuates between min and max A due to anterior trunk lean.                         PT Short Term Goals - 05/01/16 0946      PT SHORT TERM GOAL #1   Title Pt will consistently perform supine rolling in B directions at S level (with rails as needed) in order to indicate improved trunk dissociation and increaed independence with bed mobility.  (Target  Date for STGs: 05/28/16)   Time 4   Period Weeks   Status New     PT SHORT TERM GOAL #2   Title Pt will consistently perform R SL>sit at mod A level in order to indicate increased independence with getting out of bed.     Time 4   Period Weeks   Status New     PT SHORT TERM GOAL #3   Title Pt will consistently perform L SL>sit at min A level in order to indicate increased independene with getting out of bed/off of exercise mat table.    Time 4   Period Weeks   Status New     PT SHORT TERM GOAL #4   Title Pt will consistently perform sit to SL R and L at mod A level in order to indicate improved independence with bed mobility.     Time 4   Period Weeks     PT SHORT TERM GOAL #5   Title Pt will perform transfers from w/c to seated surface at min/guard level in order to indicate improved independence with transfers.    Time 4   Period Weeks   Status New     PT SHORT TERM GOAL #6   Title Pt will perform static standing with BUE support x 3 mins at S level with no overt LOB to decrease burden of care and increase independence with ADLs.    Time 4   Period Weeks   Status New     PT SHORT TERM GOAL #7   Title Pt will perform dynamic sitting at [facility] with intermittent UE support, reaching outside of BOS x 5 mins in order to indicate safety with ADLs.    Time 4   Period Weeks   Status New           PT Long Term Goals - 05/01/16 0953      PT LONG TERM GOAL #1   Title Pt will perform supine rolling R and L at mod I level in order to indicate improved independence with bed mobility.  (Target Date: 06/25/16)   Time 8   Period Weeks   Status New     PT LONG TERM GOAL #2   Title Pt will perform sit>SL R or L at min A level in order to indicate improved independence with getting into bed.    Time 8   Period Weeks   Status New     PT LONG TERM GOAL #3   Title Pt will perform SL R to sit at min A level, SL L to sit at S level in order to indicate increased independence with  getting  out of bed.     Time 8   Period Weeks   Status New     PT LONG TERM GOAL #4   Title Pt will perform transfers at S level 50% of the time in order to indicate improved functional independence.    Time 8   Period Weeks   Status New     PT LONG TERM GOAL #5   Title Pt will be able to stand with single UE support while performing opposite UE task x 3 mins at S level in order to indicate improved independence with ADLs.    Time 8   Period Weeks   Status New     Additional Long Term Goals   Additional Long Term Goals Yes     PT LONG TERM GOAL #6   Title Pt will demonstrate ability to ambulate up to 25' w/ LRAD at min A level in order to assist with getting around home.     Time 8   Period Weeks   Status New               Plan - 05/20/16 1546    Clinical Impression Statement Skilled session focused on pt attaining midline posture, progressing to lateral weight shifts and finally to very slow gait in // bars to put all NMR together.  Finally had pt trial new "up walker" and note marked improvement in ability to weigt shift, load each LE in stance, and maintain upright posture.  Caregiver was assisting to ensure that walker did not to too far forward, but overall worked very well.     Rehab Potential Fair   Clinical Impairments Affecting Rehab Potential Chronicity of condition, continued medical management, but has very strong family and financial support   PT Frequency 2x / week   PT Duration 8 weeks   PT Treatment/Interventions ADLs/Self Care Home Management;DME Instruction;Gait training;Stair training;Functional mobility training;Therapeutic activities;Patient/family education;Cognitive remediation;Neuromuscular re-education;Therapeutic exercise;Balance training;Orthotic Fit/Training;Wheelchair mobility training;Manual techniques;Splinting;Visual/perceptual remediation/compensation;Vestibular;Passive range of motion;Electrical Stimulation   PT Next Visit Plan Start to look  at [place], continue standing/ gait training/weight shifting activities   Consulted and Agree with Plan of Care Patient;Family member/caregiver   Family Member Consulted caregiver, [name] and husband [name]      Patient will benefit from skilled therapeutic intervention in order to improve the following deficits and impairments:  Abnormal gait, Decreased balance, Decreased endurance, Decreased cognition, Cardiopulmonary status limiting activity, Decreased activity tolerance, Decreased coordination, Decreased strength, Impaired flexibility, Impaired tone, Decreased mobility, Increased muscle spasms, Impaired vision/preception, Decreased range of motion, Impaired UE functional use, Postural dysfunction, Improper body mechanics, Impaired perceived functional ability, Decreased knowledge of precautions, Decreased knowledge of use of DME  Visit Diagnosis: Unsteadiness on feet  Abnormal posture  Muscle weakness (generalized)     Problem List Patient Active Problem List   Diagnosis Date Noted  . Heterotopic ossification of bone 03/10/2016  . Acute on chronic respiratory failure with hypoxia ([place])   . Acute hypoxemic respiratory failure ([place])   . Acute blood loss anemia   . Atelectasis 02/04/2016  . Debility 02/03/2016  . Pneumonia of both lower lobes due to Pseudomonas species ([place])   . Pneumonia of both lower lobes due to methicillin susceptible Staphylococcus aureus (MSSA) ([place])   . Acute respiratory failure with hypoxia ([place])   . Acute encephalopathy   . Seizures ([place])   . Sepsis ([place])   . Hypoxia   . Pain   . HCAP (healthcare-associated pneumonia) 12/28/2015  . Chronic  respiratory failure ([place]) 12/28/2015  . Acute tracheobronchitis 12/24/2015  . Tracheostomy tube present ([place])   . Tracheal stenosis   . Chronic respiratory failure with hypoxia ([place])   . 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[place]) 10/20/2015  . Tracheostomy status ([place]) 09/26/2015  . Allergic rhinitis 09/26/2015  . Viral encephalitis  09/22/2015  . Movement disorder 09/22/2015  . Encephalitis 09/22/2015  . Chest pain 02/07/2014  . Premature atrial contractions 01/13/2014  . Abnormality of gait 12/04/2013  . Hyperlipidemia 12/21/2011  . Cardiovascular risk factor 12/21/2011  . Bunion 01/31/2008  . Metatarsalgia of both feet 09/20/2007  . FLAT FOOT 09/20/2007  . HALLUX RIGIDUS, ACQUIRED 09/20/2007    Cameron Sprang, PT, MPT [facility] [address] [place], [place], [address] Phone: [phone]   Fax:  [phone] 05/20/16, 3:56 PM  Name: Judy Spears MRN: [id number] Date of Birth: [birth year]

## 2016-05-20 NOTE — Therapy (Signed)
[facility] [address] [place], [place], [address] Phone: [phone]   Fax:  [phone]  Occupational Therapy Treatment  Patient Details  Name: Judy Spears MRN: [id number] Date of Birth: 08/26/1950 Referring Provider: Dr. Quita Skye. [name]  Encounter Date: 05/20/2016      OT End of Session - 05/20/16 1625    Visit Number 20   Number of Visits 32   Date for OT Re-Evaluation 07/08/16   Authorization Type [employer] unlimited visits   OT Start Time 1446   OT Stop Time 1531   OT Time Calculation (min) 45 min   Activity Tolerance Patient tolerated treatment well      Past Medical History:  Diagnosis Date  . Arthritis    lt great toe  . Complication of anesthesia    pt has had headaches post op-did advise to hydra well preop  . Hair loss    right-sided  . History of exercise stress test    a. ETT (12/15) with 12:00 exercise, no ST changes, occasional PACs.   . Hyperlipidemia   . Insomnia   . Migraine headache   . 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 Postmenopausal   . Premature atrial contractions    a. Holter (12/15) with 8% PACs, no atrial runs or atrial fibrillation.     Past Surgical History:  Procedure Laterality Date  . BREAST BIOPSY  01/01/2011   Procedure: BREAST BIOPSY WITH NEEDLE LOCALIZATION;  Surgeon: Rolm Bookbinder, MD;  Location: [facility];  Service: General;  Laterality: Left;  left breast wire localization biopsy  . cataracts right eye    . CESAREAN SECTION  1986  . HERNIA REPAIR  2000   RIH  . opirectinal membrane peel    . RHINOPLASTY  1983    There were no vitals filed for this visit.      Subjective Assessment - 05/20/16 1452    Subjective  Yes when asked if she was sleepy   Patient is accompained by: Family member  husband [name], caregiver [name]   Pertinent History see epic snapshot - ABI/encephalitis/hypoxia from virus; recent hospitalization due  to ARF;    Patient Stated Goals Pt unable to state.    Currently in Pain? No/denies                      OT Treatments/Exercises (OP) - 05/20/16 0001      ADLs   ADL Comments Addressed washing face - pt needs mod a and hand over hand.  Also addressed donning and doffing shirt - pt needs max cues, hand over hand for hand placement and orientation, and max assist but is able to participate in task.     Neurological Re-education Exercises   Other Exercises 2 Neuro re ed to address forwrard leaning in prep for transfers, trunk mobilty and rotation to decrease stifness and using LE's to push for lift of bottom during squat pivot transfers.  Once on mat worked on squatting and scooting to the left and then the right along the mat with emphasis on head orientation, finding midline and lifting bottom without assistance. Pt able to lift and scoot several times with contact guard for steadying only (pt did full lift and scoot side to side). Pt benefits from max repetition for motor learning and due to significant perceputal and cognitive deficits.  Then addressed sit to stand, standing balance both static and dynamic with emphasis on  finding midline (pt with significant anterior bias and poor awareness of body in space).  Then addressed pt using scooting actiivty to work toward more independent transfers from mat to wheelchair using mulitiple scoots - pt needs   max cues and min a to complete to the left.                   OT Short Term Goals - 05/20/16 1621      OT SHORT TERM GOAL #1   Title Pt, husband and cargiver will be mod I with HEP - 03/29/2016   Status Achieved     OT SHORT TERM GOAL #2   Title Pt will be able to sit with supervision without UE support and vc's to assist with midline 75% while engage in functional task   Status Achieved     OT SHORT TERM GOAL #3   Title Pt will be able to complete sit to stand consistently with mod A for toilet transfers (max a for  pivot)   Status Achieved     OT SHORT TERM GOAL #4   Title Pt, husband and caregiver will verbalize understanding of various bathroom accomodations to reduce long term caregiver burden   Status Achieved     OT SHORT TERM GOAL #5   Title Pt will be able to stand step transfer to toilet with mo more than mod a, mod vc's consistently using grab bar for community toileting needs- 06/10/2016 date adjusted as pt missed 2 weeks due to hospitalization   Status On-going     OT SHORT TERM GOAL #6   Title Pt will be able to wash face with supervision/max cues 50% of the time, min a 50% of the time.    Status On-going     OT SHORT TERM GOAL #7   Title Pt will be able to don pull over shirt with mod a, max cues   Status On-going     OT SHORT TERM GOAL #8   Title Pt will be able to maintain static standing with UE support with close supervision to reduce burden of care for caregivers    Status On-going           OT Long Term Goals - 05/20/16 1622      OT LONG TERM GOAL #1   Title Pt, husband and caregiver will be mod I with upgraded HEP prn - 04/26/2016   Status Achieved     OT LONG TERM GOAL #2   Title Pt, husband and caregiver will be mod I with splint wear and care    Status Achieved     OT LONG TERM GOAL #3   Title Pt will be able to wash face with min a only   Status Achieved     OT LONG TERM GOAL #4   Title Pt will be able to sit without UE support with supevision 100% of the time with no more than 2 vc's while engaged in functional activity.   Status Achieved     OT LONG TERM GOAL #5   Title Pt, husband and caregiver will verbalize understanding of potential after care therapeutic opportunities. - 07/08/2016 date adjusted as pt missed 2 weeks due to hospitalization   Status On-going     OT LONG TERM GOAL #6   Title Pt will be supervision to wash face consistently   Status On-going     OT LONG TERM GOAL #7   Title Pt will don shirt with min  a and max vc's 50% of the time    Status On-going     OT LONG TERM GOAL #8   Title Pt will require min a for stand step transfers to toilet using grab bar for community toileting at least 75% of the time.   Status On-going     OT LONG TERM GOAL  #9   Baseline Pt will stand with contact gurard with only 1 UE support for static standing balance in prep for abiilty to engage in functional task while standing.    Status On-going               Plan - 05/20/16 1622    Clinical Impression Statement Pt with slow progress toward goals. Pt with increased rigidity due to recent hospitalization impacting UE use and functional mobility.     Rehab Potential Fair   Clinical Impairments Affecting Rehab Potential severity of deficits, very slow rate of recovery   OT Frequency 2x / week   OT Duration 8 weeks   OT Treatment/Interventions Self-care/ADL training;Ultrasound;Moist Heat;Electrical Stimulation;DME and/or AE instruction;Neuromuscular education;Therapeutic exercise;Functional Mobility Training;Manual Therapy;Passive range of motion;Splinting;Therapeutic activities;Balance training;Patient/family education;Visual/perceptual remediation/compensation;Cognitive remediation/compensation   Plan NMR for functional use of UE's, static to dynamic standing balance, postural control, NMR for trunk control, functional mobility   Consulted and Agree with Plan of Care Patient;Family member/caregiver   Family Member Consulted caregiver [name], husband [name]       Patient will benefit from skilled therapeutic intervention in order to improve the following deficits and impairments:  Decreased endurance, Decreased skin integrity, Impaired vision/preception, Improper body mechanics, Impaired perceived functional ability, Decreased knowledge of precautions, Cardiopulmonary status limiting activity, Decreased activity tolerance, Decreased knowledge of use of DME, Decreased strength, Impaired flexibility, Improper spinal/pelvic alignment, Impaired  sensation, Difficulty walking, Decreased mobility, Decreased balance, Decreased cognition, Decreased range of motion, Impaired tone, Impaired UE functional use, Decreased safety awareness, Decreased coordination, Pain  Visit Diagnosis: Unsteadiness on feet  Abnormal posture  Muscle weakness (generalized)  Other symptoms and signs involving the nervous system  Stiffness of right shoulder, not elsewhere classified  Stiffness of left shoulder, not elsewhere classified  Stiffness of left elbow, not elsewhere classified  Frontal lobe and executive function deficit    Problem List Patient Active Problem List   Diagnosis Date Noted  . Heterotopic ossification of bone 03/10/2016  . Acute on chronic respiratory failure with hypoxia ([facility])   . Acute hypoxemic respiratory failure ([place])   . Acute blood loss anemia   . Atelectasis 02/04/2016  . Debility 02/03/2016  . Pneumonia of both lower lobes due to Pseudomonas species ([facility] [place])   . Pneumonia of both lower lobes due to methicillin susceptible Staphylococcus aureus (MSSA) ([facility])   . Acute respiratory failure with hypoxia ([facility])   . Acute encephalopathy   . Seizures ([place])   . Sepsis ([facility])   . Hypoxia   . Pain   . HCAP (healthcare-associated pneumonia) 12/28/2015  . Chronic respiratory failure ([place]) 12/28/2015  . Acute tracheobronchitis 12/24/2015  . Tracheostomy tube present ([facility])   . Tracheal stenosis   . Chronic respiratory failure with hypoxia ([place])   . 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 Tracheostomy status ([facility]) 09/26/2015  . Allergic rhinitis 09/26/2015  . 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 Movement disorder 09/22/2015  . Encephalitis 09/22/2015  . Chest pain 02/07/2014  . Premature atrial contractions 01/13/2014  . Abnormality of gait 12/04/2013  . Hyperlipidemia 12/21/2011  . Cardiovascular risk factor 12/21/2011  . Bunion 01/31/2008  .  Metatarsalgia of both feet 09/20/2007  . FLAT FOOT 09/20/2007  . HALLUX RIGIDUS,  ACQUIRED 09/20/2007    Quay Burow, OTR/L 05/20/2016, 4:27 PM  [facility] [address] [place], [place], [address] Phone: [phone]   Fax:  [phone]  Name: SHANZAY HEPWORTH MRN: [id number] Date of Birth: 10-Jun-1950

## 2016-05-20 NOTE — Therapy (Signed)
[facility] [address] [place], [place], [address] Phone: [phone]   Fax:  [phone]  Speech Language Pathology Treatment  Patient Details  Name: Judy Spears MRN: [id number] Date of Birth: [birth year] Referring Provider: Charlett Blake, MD  Encounter Date: 05/20/2016      End of Session - 05/20/16 1555    Visit Number 21   Number of Visits 33   Date for SLP Re-Evaluation 06/25/16   SLP Start Time 1448   SLP Stop Time  1530   SLP Time Calculation (min) 42 min   Activity Tolerance Patient tolerated treatment well      Past Medical History:  Diagnosis Date  . Arthritis    lt great toe  . Complication of anesthesia    pt has had headaches post op-did advise to hydra well preop  . Hair loss    right-sided  . History of exercise stress test    a. ETT (12/15) with 12:00 exercise, no ST changes, occasional PACs.   . Hyperlipidemia   . Insomnia   . Migraine headache   . 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 Postmenopausal   . Premature atrial contractions    a. Holter (12/15) with 8% PACs, no atrial runs or atrial fibrillation.     Past Surgical History:  Procedure Laterality Date  . BREAST BIOPSY  01/01/2011   Procedure: BREAST BIOPSY WITH NEEDLE LOCALIZATION;  Surgeon: Rolm Bookbinder, MD;  Location: [facility];  Service: General;  Laterality: Left;  left breast wire localization biopsy  . cataracts right eye    . CESAREAN SECTION  1986  . HERNIA REPAIR  2000   RIH  . opirectinal membrane peel    . RHINOPLASTY  1983    There were no vitals filed for this visit.      Subjective Assessment - 05/20/16 1449    Subjective Caregiver reports pt recovering well after hospitalization 4/12-4/17 (for fever and rhonchorous cough s/p decannulation)   Patient is accompained by: Family member   Special Tests spouse, [name]; caregiver [name]               ADULT SLP TREATMENT  - 05/20/16 1445      General Information   Behavior/Cognition Alert;Cooperative;Pleasant mood   Patient Positioning Upright in chair   Oral care provided Yes     Treatment Provided   Treatment provided Dysphagia;Cognitive-Linquistic     Dysphagia Treatment   Temperature Spikes Noted --  Fever 05/06/16, hospitalized 5 days   Respiratory Status Room air   Oral Cavity - Dentition Adequate natural dentition   Treatment Methods Skilled observation;Therapeutic exercise;Compensation strategy training;Upgraded PO texture trial;Patient/caregiver education   Patient observed directly with PO's Yes   Type of PO's observed Thin liquids   Feeding Needs assist   Liquids provided via Cup   Oral Phase Signs & Symptoms Anterior loss/spillage;Prolonged bolus formation;Oral holding   Pharyngeal Phase Signs & Symptoms Immediate cough;Wet vocal quality   Type of cueing Verbal;Tactile;Visual   Amount of cueing Moderate   Other treatment/comments Provided education to patient, husband and caregiver re: conservative approach to POs given recent hospitalization for tracheobronchitis, ? pneumonia, pt's weak cough, difficulty expectorating secretions. SLP educated that while pt no longer taking antibiotics, no active fever, conservative approach recommended at this time given clinical presentation of immediate cough response following sips of thin liquids, weak, ineffective cough and difficulty clearing secretions. Per MD notes 05/06/16, CXR  showed RLL infiltrate.      Pain Assessment   Pain Assessment No/denies pain   Pain Score 0-No pain     Cognitive-Linquistic Treatment   Treatment focused on Voice     Tracheostomy Shiley 4 mm Uncuffed   Tracheostomy Properties Placement Date: 05/08/16 Placement Time: 1030 Placed By: ICU physician Brand: Shiley Size (mm): 4 mm Style: Uncuffed   Status Capped   Site Assessment Clean;Dry   Ties Assessment Clean;Dry;Secure   Cuff pressure (cm) --  cuffless      Assessment / Recommendations / Plan   Plan Continue with current plan of care     Dysphagia Recommendations   Diet recommendations Other(comment);NPO  Free water protocol   Liquids provided via Teaspoon;Cup   Medication Administration Via alternative means   Supervision Full supervision/cueing for compensatory strategies;Trained caregiver to feed patient   Compensations Effortful swallow;Multiple dry swallows after each bite/sip;Check for anterior loss;Small sips/bites;Slow rate   Postural Changes and/or Swallow Maneuvers Seated upright 90 degrees     Progression Toward Goals   Progression toward goals Progressing toward goals          SLP Education - 05/20/16 1551    Education provided Yes   Education Details clinical signs of aspiration, risks of aspiration in setting of recent hospitalization/decreased respiratory status, free water protocol   Person(s) Educated Patient;Spouse;Caregiver(s)   Methods Explanation   Comprehension Verbalized understanding;Need further instruction          SLP Short Term Goals - 05/20/16 1605      SLP SHORT TERM GOAL #1   Title Patient will utilize intelligibility strategies with moderate visual cues from communication partner/visual aid to communicate 5-7 word wants, needs, thoughts and ideas.   Baseline 03/15/16, 04/05/16, 04/12/16, 04/19/16   Time 2   Period Weeks   Status On-going     SLP SHORT TERM GOAL #2   Title Patient will utilize intelligibility strategies, breath support with mod A for phrase repetition in 75% of trials.   Baseline 2.12.18, 04/05/16, 04/08/16, 04/12/16   Time 1   Period Weeks   Status Achieved     SLP SHORT TERM GOAL #3   Title Pt will swallow thin liquids, purees and soft solids in 9/10 trials with no overt signs of aspiration.   Baseline 2.12.18, 03-12-16, 03-15-16   Time 4   Period Weeks   Status Achieved     SLP SHORT TERM GOAL #4   Title Patient will tolerate 6 oz pleasure feeds of pureed solids and/or thin  liquids, >95% with no overt signs of aspiration.   Baseline Husband reports pt ate "a whole bowl of soup" with no overt s/sx aspiration   Time 2   Period Weeks   Status On-going     SLP SHORT TERM GOAL #5   Title Patient will utilize verbal cues for lip closure, dry swallow for secretion management in 75% of opportunities.   Time 2   Period Weeks   Status On-going          SLP Long Term Goals - 05/20/16 1607      SLP LONG TERM GOAL #1   Title Patient will utilize multimodal communication/AAC to clarify wants, needs, thoughts or ideas during communication breakdowns with max A   Time 2   Period Weeks   Status Deferred     SLP LONG TERM GOAL #2   Title Patient will utilize intelligibility strategies, breath support with min A visual cues for 5-7 word responses  with 80% accuracy.   Time 6   Period Weeks   Status On-going     SLP LONG TERM GOAL #3   Title Patient will demonstrate readiness for MBS to determine safety for pleasure feeds by timely initiation of swallow in 3/5 trials with ice chips/H20.   Baseline 2.12.18   Time 2   Period Weeks   Status Achieved     SLP LONG TERM GOAL #4   Title Patient will adequately masticate and clear soft solids with mod verbal cues and no overt signs of aspiration in 4/5 trials.   Baseline 04/19/16: chip reports whole bowl of pureed soup   Time 6   Period Weeks   Status On-going     SLP LONG TERM GOAL #5   Title Patient will utilize visual cue for lip closure, dry swallow for secretion management in 75% of opportunities.   Time 6   Status On-going          Plan - 05/20/16 1555    Clinical Impression Statement Patient remained alert and attentive during session with need for verbal cue x1 for alertness. Pt proved her need for consistent max external cuing for appropriate and adequate voicing as well as for adequate breath support for speech. Large portion of session focused on patient/family/caregiver education given pt's recent  hospitalization, weak cough response and clinical signs of aspiration. Recommended use of Free Water Protocol only at this time, ceasing other POs. Anticipate return to POs as pt improves. Recommend cont' d skilled ST in order to maximize functional communication, swallowing safety, reduce caregiver burden and improve quality of life.   Speech Therapy Frequency 2x / week   Treatment/Interventions Aspiration precaution training;Trials of upgraded texture/liquids;Compensatory strategies;Functional tasks;Patient/family education;Cueing hierarchy;Diet toleration management by SLP;Cognitive reorganization;Compensatory techniques;SLP instruction and feedback;Internal/external aids;Multimodal communcation approach   Potential to Achieve Goals Good   Potential Considerations Ability to learn/carryover information;Co-morbidities;Severity of impairments   SLP Home Exercise Plan free water protocol only PO at this time, continue functional phrases   Consulted and Agree with Plan of Care Family member/caregiver;Patient   Family Member Consulted husband chip, caregiver lisa      Patient will benefit from skilled therapeutic intervention in order to improve the following deficits and impairments:   Dysphagia, oropharyngeal phase  Cognitive communication deficit  Dysarthria and anarthria    Problem List Patient Active Problem List   Diagnosis Date Noted  . Heterotopic ossification of bone 03/10/2016  . Acute on chronic respiratory failure with hypoxia ([facility])   . Acute hypoxemic respiratory failure ([facility])   . Acute blood loss anemia   . Atelectasis 02/04/2016  . Debility 02/03/2016  . Pneumonia of both lower lobes due to Pseudomonas species ([facility])   . Pneumonia of both lower lobes due to methicillin susceptible Staphylococcus aureus (MSSA) ([facility])   . Acute respiratory failure with hypoxia ([facility])   . Acute encephalopathy   . Seizures ([facility])   . Sepsis ([facility])   . Hypoxia   . Pain   . HCAP  (healthcare-associated pneumonia) 12/28/2015  . Chronic respiratory failure ([facility]) 12/28/2015  . Acute tracheobronchitis 12/24/2015  . Tracheostomy tube present ([facility])   . Tracheal stenosis   . Chronic respiratory failure with hypoxia ([place])   . 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[facility]) 10/20/2015  . Tracheostomy status ([facility]) 09/26/2015  . Allergic rhinitis 09/26/2015  . 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 Movement disorder 09/22/2015  . Encephalitis 09/22/2015  . Chest pain 02/07/2014  . Premature atrial contractions 01/13/2014  . Abnormality  of gait 12/04/2013  . Hyperlipidemia 12/21/2011  . Cardiovascular risk factor 12/21/2011  . Bunion 01/31/2008  . Metatarsalgia of both feet 09/20/2007  . FLAT FOOT 09/20/2007  . HALLUX RIGIDUS, ACQUIRED 09/20/2007   Deneise Lever, [facility] CF-SLP Speech-Language Pathologist   Aliene Altes 05/20/2016, 4:08 PM  [facility] [address] [place], [place], [address] Phone: [phone]   Fax:  [phone]   Name: Judy Spears MRN: [id number] Date of Birth: 08-Jun-1950

## 2016-05-24 ENCOUNTER — Ambulatory Visit: Payer: BC Managed Care – PPO | Admitting: Physical Therapy

## 2016-05-24 ENCOUNTER — Encounter: Payer: Self-pay | Admitting: Occupational Therapy

## 2016-05-24 ENCOUNTER — Ambulatory Visit: Payer: BC Managed Care – PPO | Admitting: Occupational Therapy

## 2016-05-24 ENCOUNTER — Ambulatory Visit: Payer: BC Managed Care – PPO | Admitting: Speech Pathology

## 2016-05-24 DIAGNOSIS — M25612 Stiffness of left shoulder, not elsewhere classified: Secondary | ICD-10-CM

## 2016-05-24 DIAGNOSIS — R41841 Cognitive communication deficit: Secondary | ICD-10-CM

## 2016-05-24 DIAGNOSIS — M25611 Stiffness of right shoulder, not elsewhere classified: Secondary | ICD-10-CM

## 2016-05-24 DIAGNOSIS — R293 Abnormal posture: Secondary | ICD-10-CM

## 2016-05-24 DIAGNOSIS — M25622 Stiffness of left elbow, not elsewhere classified: Secondary | ICD-10-CM

## 2016-05-24 DIAGNOSIS — R2681 Unsteadiness on feet: Secondary | ICD-10-CM | POA: Diagnosis not present

## 2016-05-24 DIAGNOSIS — R2689 Other abnormalities of gait and mobility: Secondary | ICD-10-CM

## 2016-05-24 DIAGNOSIS — R471 Dysarthria and anarthria: Secondary | ICD-10-CM

## 2016-05-24 DIAGNOSIS — M6281 Muscle weakness (generalized): Secondary | ICD-10-CM

## 2016-05-24 DIAGNOSIS — R41844 Frontal lobe and executive function deficit: Secondary | ICD-10-CM

## 2016-05-24 DIAGNOSIS — R1312 Dysphagia, oropharyngeal phase: Secondary | ICD-10-CM

## 2016-05-24 DIAGNOSIS — R29818 Other symptoms and signs involving the nervous system: Secondary | ICD-10-CM

## 2016-05-24 NOTE — Therapy (Signed)
[facility] [address] [place], [place], [address] Phone: [phone]   Fax:  [phone]  Physical Therapy Treatment  Patient Details  Name: Judy Spears MRN: [id number] Date of Birth: 17-May-1950 Referring Provider: Faith Rogue, MD/Andrew Wynn Banker, MD  Encounter Date: 05/24/2016      PT End of Session - 05/24/16 2108    Visit Number 22   Number of Visits 36   Date for PT Re-Evaluation 06/29/16   Authorization Type BCBS   PT Start Time 1446   PT Stop Time 1532   PT Time Calculation (min) 46 min      Past Medical History:  Diagnosis Date  . Arthritis    lt great toe  . Complication of anesthesia    pt has had headaches post op-did advise to hydra well preop  . Hair loss    right-sided  . History of exercise stress test    a. ETT (12/15) with 12:00 exercise, no ST changes, occasional PACs.   . Hyperlipidemia   . Insomnia   . Migraine headache   . Movement disorder    resltess in left legs  . Postmenopausal   . Premature atrial contractions    a. Holter (12/15) with 8% PACs, no atrial runs or atrial fibrillation.     Past Surgical History:  Procedure Laterality Date  . BREAST BIOPSY  01/01/2011   Procedure: BREAST BIOPSY WITH NEEDLE LOCALIZATION;  Surgeon: Emelia Loron, MD;  Location: [facility];  Service: General;  Laterality: Left;  left breast wire localization biopsy  . cataracts right eye    . CESAREAN SECTION  1986  . HERNIA REPAIR  2000   RIH  . opirectinal membrane peel    . RHINOPLASTY  1983    There were no vitals filed for this visit.      Subjective Assessment - 05/24/16 2101    Subjective Caregiver reports they like the UP walker   Patient is accompained by: Family member   Pertinent History Precautions:  PEG.  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  Another round of botox in LLE on 02/06/16   Limitations House hold activities;Walking;Standing;Sitting   Patient Stated Goals Per husband, "For Judy Spears to be as independent as possible."   Currently in Pain? No/denies                         [facility] Adult PT Treatment/Exercise - 05/24/16 1458      Transfers   Transfers Sit to Stand   Sit to Stand 3: Mod assist   Sit to Stand Details Tactile cues for placement;Verbal cues for technique;Manual facilitation for weight shifting;Verbal cues for gait pattern     Ambulation/Gait   Ambulation/Gait Yes   Ambulation/Gait Assistance 4: Min assist   Ambulation Distance (Feet) 115 Feet  2 reps - seated rest break after 115'   Assistive device None  UP walker   Gait Pattern Decreased stance time - right;Decreased hip/knee flexion - right;Decreased hip/knee flexion - left;Decreased weight shift to right;Scissoring;Ataxic   Ambulation Surface Level;Indoor   Gait Comments mod assist to keep Lt trunk elevated by placing hand on  rib cage near axilla region     NeuroRe-ed;  Pt transferred wheelchair to mat with mod assist using stand pivot transfer  Worked on trunk rotation in seated position - with hands on mat on Rt side to increase Lt trunk rotation and Lt musc. Stretching Hands on Lt side to increase stretching Rt trunk musc.   Worked on standing balance - pt holding onto PT's Rt shoulder and belt loop on Lt side - only CGA to SBA given to maintain balance  Pt performed lateral weight shifts in standing with mod to min assist - 10 reps to each side  Cues given for hand placement in stand to sit transfers - min assist needed for controlled descent   Pt transferred mat to wheelchair using a squat pivot transfer with cues for hand placement - transferred toward Rt side with armrest removed - - mod assist needed            PT Short Term Goals -  05/24/16 2123      PT SHORT TERM GOAL #1   Title Pt will consistently perform supine rolling in B directions at S level (with rails as needed) in order to indicate improved trunk dissociation and increaed independence with bed mobility.  (Target Date for STGs: 05/28/16)   Baseline min assist needed per husband's report - 05-24-16   Status On-going     PT SHORT TERM GOAL #2   Title Pt will consistently perform R SL>sit at mod A level in order to indicate increased independence with getting out of bed.       PT SHORT TERM GOAL #3   Title Pt will consistently perform L SL>sit at min A level in order to indicate increased independene with getting out of bed/off of exercise mat table.      PT SHORT TERM GOAL #4   Title Pt will consistently perform sit to SL R and L at mod A level in order to indicate improved independence with bed mobility.       PT SHORT TERM GOAL #5   Title Pt will perform transfers from w/c to seated surface at min/guard level in order to indicate improved independence with transfers.    Baseline mod assist needed - 05-24-16   Time 4   Period Weeks   Status On-going     PT SHORT TERM GOAL #6   Title Pt will perform static standing with BUE support x 3 mins at S level with no overt LOB to decrease burden of care and increase independence with ADLs.    Baseline met 05-24-16   Status Achieved     PT SHORT TERM GOAL #7   Title Pt will perform dynamic sitting at [facility] with intermittent UE support, reaching outside of BOS x 5 mins in order to indicate safety with ADLs.    Baseline met 05-24-16   Status Achieved           PT Long Term Goals - 05/01/16 0953      PT LONG TERM GOAL #1   Title Pt will perform supine rolling R and L at mod I level in order to indicate improved independence with bed mobility.  (Target Date: 06/25/16)   Time 8   Period Weeks   Status New     PT LONG TERM GOAL #2   Title Pt will perform sit>SL R or L at min A level in order to indicate improved  independence with getting into bed.    Time 8   Period Weeks   Status New     PT LONG TERM GOAL #3   Title Pt will perform  SL R to sit at min A level, SL L to sit at S level in order to indicate increased independence with getting out of bed.     Time 8   Period Weeks   Status New     PT LONG TERM GOAL #4   Title Pt will perform transfers at S level 50% of the time in order to indicate improved functional independence.    Time 8   Period Weeks   Status New     PT LONG TERM GOAL #5   Title Pt will be able to stand with single UE support while performing opposite UE task x 3 mins at S level in order to indicate improved independence with ADLs.    Time 8   Period Weeks   Status New     Additional Long Term Goals   Additional Long Term Goals Yes     PT LONG TERM GOAL #6   Title Pt will demonstrate ability to ambulate up to 25' w/ LRAD at min A level in order to assist with getting around home.     Time 8   Period Weeks   Status New               Plan - 05/24/16 2108    Clinical Impression Statement Pt did well with gait training with use of UP walker with pt amb. 115' x 2 reps - furthest distance with amb. during PT session.  Pt continues to need mod assist with keeping Lt trunk elevated and needs cues to translate weight anteriorly over stance leg prior to stepping with other leg.   Rehab Potential Fair   Clinical Impairments Affecting Rehab Potential Chronicity of condition, continued medical management, but has very strong family and financial support   PT Frequency 2x / week   PT Duration 8 weeks   PT Treatment/Interventions ADLs/Self Care Home Management;DME Instruction;Gait training;Stair training;Functional mobility training;Therapeutic activities;Patient/family education;Cognitive remediation;Neuromuscular re-education;Therapeutic exercise;Balance training;Orthotic Fit/Training;Wheelchair mobility training;Manual techniques;Splinting;Visual/perceptual  remediation/compensation;Vestibular;Passive range of motion;Electrical Stimulation   PT Next Visit Plan cont STG assessment; gait training and weight shifting   Consulted and Agree with Plan of Care Family member/caregiver   Family Member Consulted caregiver, Judy Spears and husband Judy Spears      Patient will benefit from skilled therapeutic intervention in order to improve the following deficits and impairments:  Abnormal gait, Decreased balance, Decreased endurance, Decreased cognition, Cardiopulmonary status limiting activity, Decreased activity tolerance, Decreased coordination, Decreased strength, Impaired flexibility, Impaired tone, Decreased mobility, Increased muscle spasms, Impaired vision/preception, Decreased range of motion, Impaired UE functional use, Postural dysfunction, Improper body mechanics, Impaired perceived functional ability, Decreased knowledge of precautions, Decreased knowledge of use of DME  Visit Diagnosis: Other abnormalities of gait and mobility     Problem List Patient Active Problem List   Diagnosis Date Noted  . Heterotopic ossification of bone 03/10/2016  . Acute on chronic respiratory failure with hypoxia ([place])   . Acute hypoxemic respiratory failure ([place])   . Acute blood loss anemia   . Atelectasis 02/04/2016  . Debility 02/03/2016  . Pneumonia of both lower lobes due to Pseudomonas species ([place])   . Pneumonia of both lower lobes due to methicillin susceptible Staphylococcus aureus (MSSA) ([place])   . Acute respiratory failure with hypoxia ([place])   . Acute encephalopathy   . Seizures ([place])   . Sepsis ([place])   . Hypoxia   . Pain   . HCAP (healthcare-associated pneumonia) 12/28/2015  . Chronic respiratory failure ([place]) 12/28/2015  .  Acute tracheobronchitis 12/24/2015  . Tracheostomy tube present ([place])   . Tracheal stenosis   . Chronic respiratory failure with hypoxia ([place])   . 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[place]) 10/20/2015  . Tracheostomy status ([place]) 09/26/2015  .  Allergic rhinitis 09/26/2015  . 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 Movement disorder 09/22/2015  . Encephalitis 09/22/2015  . Chest pain 02/07/2014  . Premature atrial contractions 01/13/2014  . Abnormality of gait 12/04/2013  . Hyperlipidemia 12/21/2011  . Cardiovascular risk factor 12/21/2011  . Bunion 01/31/2008  . Metatarsalgia of both feet 09/20/2007  . FLAT FOOT 09/20/2007  . HALLUX RIGIDUS, ACQUIRED 09/20/2007    Alda Lea, PT 05/24/2016, 9:28 PM  [facility] [address] [place], [place], [address] Phone: [phone]   Fax:  [phone]  Name: Judy Spears MRN: [id number] Date of Birth: 1950-10-04

## 2016-05-24 NOTE — Therapy (Signed)
[facility] [address] [place], [place], [address] Phone: [phone]   Fax:  [phone]  Speech Language Pathology Treatment  Patient Details  Name: Judy Spears MRN: [id number] Date of Birth: [birth year] Referring Provider: Charlett Blake, MD  Encounter Date: 05/24/2016      End of Session - 05/24/16 1729    Visit Number 22   Number of Visits 33   Date for SLP Re-Evaluation 06/25/16   SLP Start Time 1245   SLP Stop Time  1445   SLP Time Calculation (min) 42 min   Activity Tolerance Patient tolerated treatment well      Past Medical History:  Diagnosis Date  . Arthritis    lt great toe  . Complication of anesthesia    pt has had headaches post op-did advise to hydra well preop  . Hair loss    right-sided  . History of exercise stress test    a. ETT (12/15) with 12:00 exercise, no ST changes, occasional PACs.   . Hyperlipidemia   . Insomnia   . Migraine headache   . Movement disorder    resltess in left legs  . Postmenopausal   . Premature atrial contractions    a. Holter (12/15) with 8% PACs, no atrial runs or atrial fibrillation.     Past Surgical History:  Procedure Laterality Date  . BREAST BIOPSY  01/01/2011   Procedure: BREAST BIOPSY WITH NEEDLE LOCALIZATION;  Surgeon: Rolm Bookbinder, MD;  Location: [facility];  Service: General;  Laterality: Left;  left breast wire localization biopsy  . cataracts right eye    . CESAREAN SECTION  1986  . HERNIA REPAIR  2000   RIH  . opirectinal membrane peel    . RHINOPLASTY  1983    There were no vitals filed for this visit.      Subjective Assessment - 05/24/16 1607    Subjective "Fabulous" re: wedding she attended over the weekend   Patient is accompained by: Family member   Special Tests spouse, [name]; caregiver [name]   Currently in Pain? No/denies               ADULT SLP TREATMENT - 05/24/16 1403      General  Information   Behavior/Cognition Alert;Cooperative;Pleasant mood   Patient Positioning Upright in chair   Oral care provided Yes     Treatment Provided   Treatment provided Dysphagia;Cognitive-Linquistic     Dysphagia Treatment   Temperature Spikes Noted No   Respiratory Status Room air   Oral Cavity - Dentition Adequate natural dentition   Treatment Methods Skilled observation;Therapeutic exercise;Compensation strategy training;Upgraded PO texture trial;Patient/caregiver education   Patient observed directly with PO's Yes   Type of PO's observed Thin liquids   Liquids provided via Cup   Oral Phase Signs & Symptoms Anterior loss/spillage;Prolonged bolus formation;Oral holding   Pharyngeal Phase Signs & Symptoms Wet vocal quality;Multiple swallows   Type of cueing Verbal;Tactile;Visual   Amount of cueing Moderate   Other treatment/comments Husband states he is concerned pt is still unable to generate a productive/effective cough. They have a f/u with pulmonology tomorrow. SLP provided education re: impact of weak cough, role of pressure generation in swallowing/airway protection. Recommended continuing with free water protocol at this time as pt continues to have difficulty managing secretions, present with wet vocal quality. No overt signs of aspiration with cup sips of water today; pt requires moderate verbal cues for labial closure, subsequent dry  swallow in most trials, though she did demonstrate independently in several instances for secretion management. Trained in sustained /i/ pitch glide for VF closure.     Pain Assessment   Pain Assessment No/denies pain     Cognitive-Linquistic Treatment   Treatment focused on Voice;Dysarthria;Patient/family/caregiver education   Skilled Treatment Pt was repositioned during session for optimal breath support and voicing. Pt acheived functional voicing speaking one word at a time of 3-5 word phrases with frequent written, visual, demo, and verbal  cues approx 80% of the time, and adequate breath 60% of the time with visual and verbal cues frequently. Pt with hydrophonic voice initially, which improves with verbal cues for swallow from SLP and caregiver.     Tracheostomy Shiley 4 mm Uncuffed   Tracheostomy Properties Placement Date: 05/08/16 Placement Time: 1030 Placed By: ICU physician Brand: Shiley Size (mm): 4 mm Style: Uncuffed   Status Capped   Site Assessment Clean;Dry   Ties Assessment Clean;Dry;Secure     Assessment / Recommendations / Plan   Plan Continue with current plan of care;Goals updated     Dysphagia Recommendations   Diet recommendations Other(comment);NPO  Free Water Protocol   Liquids provided via Teaspoon;Cup   Medication Administration Via alternative means   Supervision Full supervision/cueing for compensatory strategies;Trained caregiver to feed patient   Compensations Effortful swallow;Multiple dry swallows after each bite/sip;Check for anterior loss;Small sips/bites;Slow rate   Postural Changes and/or Swallow Maneuvers Seated upright 90 degrees     Progression Toward Goals   Progression toward goals Progressing toward goals          SLP Education - 05/24/16 1728    Education provided Yes   Education Details role of respiration and vocal cord adduction for voicing, cough   Person(s) Educated Patient;Spouse;Caregiver(s)   Methods Explanation   Comprehension Verbalized understanding          SLP Short Term Goals - 05/24/16 1733      SLP SHORT TERM GOAL #1   Title Patient will utilize intelligibility strategies with moderate visual cues from communication partner/visual aid to communicate 5-7 word wants, needs, thoughts and ideas.   Baseline 03/15/16, 04/05/16, 04/12/16, 04/19/16   Time 1   Period Weeks   Status On-going     SLP SHORT TERM GOAL #2   Title Patient will utilize intelligibility strategies, breath support with mod A for phrase repetition in 75% of trials.   Baseline 2.12.18,  04/05/16, 04/08/16, 04/12/16   Time 1   Period Weeks   Status Achieved     SLP SHORT TERM GOAL #3   Title Pt will swallow thin liquids, purees and soft solids in 9/10 trials with no overt signs of aspiration.   Baseline 2.12.18, 03-12-16, 03-15-16   Time 4   Period Weeks   Status Achieved     SLP SHORT TERM GOAL #4   Title Patient will tolerate 6oz water, >95% with no overt signs of aspiration.   Time 1   Status Revised     SLP SHORT TERM GOAL #5   Title Patient will utilize verbal cues for lip closure, dry swallow for secretion management in 75% of opportunities.   Baseline 80% mod cues 4/30   Time 1   Period Weeks   Status Partially Met          SLP Long Term Goals - 05/24/16 1736      SLP LONG TERM GOAL #1   Title Patient will utilize multimodal communication/AAC to clarify wants, needs,  thoughts or ideas during communication breakdowns with max A   Time 2   Period Weeks   Status Deferred     SLP LONG TERM GOAL #2   Title Patient will utilize intelligibility strategies, breath support with min A visual cues for 5-7 word responses with 80% accuracy.   Time 5   Period Weeks   Status On-going     SLP LONG TERM GOAL #3   Title Patient will demonstrate readiness for MBS to determine safety for pleasure feeds by timely initiation of swallow in 3/5 trials with ice chips/H20.   Baseline 2.12.18   Time 2   Period Weeks   Status Achieved     SLP LONG TERM GOAL #4   Title Patient will adequately masticate and clear soft solids with mod verbal cues and no overt signs of aspiration in 4/5 trials.   Baseline 04/19/16: chip reports whole bowl of pureed soup   Time 5   Period Weeks   Status Deferred     SLP LONG TERM GOAL #5   Title Patient will utilize visual cue for lip closure, dry swallow for secretion management in 75% of opportunities.   Time 5   Period Weeks   Status On-going          Plan - 05/24/16 1730    Clinical Impression Statement Patient remained alert  and attentive during session with need for verbal cue x1 for alertness. Pt required reduced need for external cuing for appropriate and adequate voicing as well as for adequate breath support for speech; mod cues today. Continued to provide patient/family/caregiver education given pt's recent hospitalization, weak cough response and clinical signs of aspiration. Patient may be appropriate candidate for RMT. Recommended continuing Free Water Protocol only at this time, no other POs. Anticipate return to POs as pt improves, however PO goals have been adjusted given medical setback. Recommend cont' d skilled ST in order to maximize functional communication, swallowing safety, reduce caregiver burden and improve quality of life.   Speech Therapy Frequency 2x / week   Treatment/Interventions Aspiration precaution training;Trials of upgraded texture/liquids;Compensatory strategies;Functional tasks;Patient/family education;Cueing hierarchy;Diet toleration management by SLP;Cognitive reorganization;Compensatory techniques;SLP instruction and feedback;Internal/external aids;Multimodal communcation approach   Potential to Achieve Goals Good   Potential Considerations Ability to learn/carryover information;Co-morbidities;Severity of impairments   SLP Home Exercise Plan free water protocol only PO at this time, continue functional phrases   Consulted and Agree with Plan of Care Family member/caregiver;Patient   Family Member Consulted husband chip, caregiver lisa      Patient will benefit from skilled therapeutic intervention in order to improve the following deficits and impairments:   Cognitive communication deficit  Dysarthria and anarthria  Dysphagia, oropharyngeal phase    Problem List Patient Active Problem List   Diagnosis Date Noted  . Heterotopic ossification of bone 03/10/2016  . Acute on chronic respiratory failure with hypoxia ([place])   . Acute hypoxemic respiratory failure ([place])   . Acute  blood loss anemia   . Atelectasis 02/04/2016  . Debility 02/03/2016  . Pneumonia of both lower lobes due to Pseudomonas species ([place])   . Pneumonia of both lower lobes due to methicillin susceptible Staphylococcus aureus (MSSA) ([place])   . Acute respiratory failure with hypoxia ([place])   . Acute encephalopathy   . Seizures ([place])   . Sepsis ([place])   . Hypoxia   . Pain   . HCAP (healthcare-associated pneumonia) 12/28/2015  . Chronic respiratory failure ([place]) 12/28/2015  . Acute tracheobronchitis 12/24/2015  .  Tracheostomy tube present ([place])   . Tracheal stenosis   . Chronic respiratory failure with hypoxia ([place])   . 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[place]) 10/20/2015  . Tracheostomy status ([place]) 09/26/2015  . Allergic rhinitis 09/26/2015  . 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 Movement disorder 09/22/2015  . Encephalitis 09/22/2015  . Chest pain 02/07/2014  . Premature atrial contractions 01/13/2014  . Abnormality of gait 12/04/2013  . Hyperlipidemia 12/21/2011  . Cardiovascular risk factor 12/21/2011  . Bunion 01/31/2008  . Metatarsalgia of both feet 09/20/2007  . FLAT FOOT 09/20/2007  . HALLUX RIGIDUS, ACQUIRED 09/20/2007   Deneise Lever, [place], [place] Speech-Language Pathologist  Aliene Altes 05/24/2016, 5:39 PM  [facility] [address] [place], [place], [address] Phone: [phone]   Fax:  [phone]   Name: KAYDRA BORGEN MRN: [id number] Date of Birth: 08-17-1950

## 2016-05-24 NOTE — Therapy (Signed)
[facility] [address] [place], [place], [address] Phone: [phone]   Fax:  [phone]  Occupational Therapy Treatment  Patient Details  Name: Judy Spears MRN: [id number] Date of Birth: [birth year] Referring Provider: Dr. Quita Skye. [name]  Encounter Date: 05/24/2016      OT End of Session - 05/24/16 1639    Visit Number 21   Number of Visits 32   Date for OT Re-Evaluation 07/08/16   Authorization Type [employer] unlimited visits   OT Start Time 8366   OT Stop Time 1359   OT Time Calculation (min) 42 min   Activity Tolerance Patient tolerated treatment well      Past Medical History:  Diagnosis Date  . Arthritis    lt great toe  . Complication of anesthesia    pt has had headaches post op-did advise to hydra well preop  . Hair loss    right-sided  . History of exercise stress test    a. ETT (12/15) with 12:00 exercise, no ST changes, occasional PACs.   . Hyperlipidemia   . Insomnia   . Migraine headache   . Movement disorder    resltess in left legs  . Postmenopausal   . Premature atrial contractions    a. Holter (12/15) with 8% PACs, no atrial runs or atrial fibrillation.     Past Surgical History:  Procedure Laterality Date  . BREAST BIOPSY  01/01/2011   Procedure: BREAST BIOPSY WITH NEEDLE LOCALIZATION;  Surgeon: Rolm Bookbinder, MD;  Location: [facility];  Service: General;  Laterality: Left;  left breast wire localization biopsy  . cataracts right eye    . CESAREAN SECTION  1986  . HERNIA REPAIR  2000   RIH  . opirectinal membrane peel    . RHINOPLASTY  1983    There were no vitals filed for this visit.      Subjective Assessment - 05/24/16 1323    Subjective  Yes when asked if she was outside this weekend   Patient is accompained by: Family member  Judy Spears husband and caregiver Judy Spears   Pertinent History see epic snapshot - ABI/encephalitis/hypoxia from virus; recent  hospitalization due to ARF;    Patient Stated Goals Pt unable to state.    Currently in Pain? No/denies                      OT Treatments/Exercises (OP) - 05/24/16 0001      ADLs   Functional Mobility Addressed stand step transfers using grab bar - pt needs max cues and mod facilitation for alignment and postural orientation as well as cues to sequence stepping around for transfer.       Neurological Re-education Exercises   Other Exercises 2 Neuro re ed to address sit to stand, static standing  balance with UE at counter with emphasis on alignment and postural orientation/control - increased thoraxic extension with rotation to both sides - after prep and input pt able to stand for 30 seconds with bilateral UE support at counter and very close supervision. Introduce active lateral weight shifts as well.                    OT Short Term Goals - 05/24/16 1637      OT SHORT TERM GOAL #1   Title Pt, husband and cargiver will be mod I with HEP - 03/29/2016   Status Achieved     OT SHORT TERM  GOAL #2   Title Pt will be able to sit with supervision without UE support and vc's to assist with midline 75% while engage in functional task   Status Achieved     OT SHORT TERM GOAL #3   Title Pt will be able to complete sit to stand consistently with mod A for toilet transfers (max a for pivot)   Status Achieved     OT SHORT TERM GOAL #4   Title Pt, husband and caregiver will verbalize understanding of various bathroom accomodations to reduce long term caregiver burden   Status Achieved     OT SHORT TERM GOAL #5   Title Pt will be able to stand step transfer to toilet with mo more than mod a, mod vc's consistently using grab bar for community toileting needs- 06/10/2016 date adjusted as pt missed 2 weeks due to hospitalization   Status On-going     OT SHORT TERM GOAL #6   Title Pt will be able to wash face with supervision/max cues 50% of the time, min a 50% of the time.     Status On-going     OT SHORT TERM GOAL #7   Title Pt will be able to don pull over shirt with mod a, max cues   Status On-going     OT SHORT TERM GOAL #8   Title Pt will be able to maintain static standing with UE support with close supervision to reduce burden of care for caregivers    Status On-going           OT Long Term Goals - 05/24/16 1637      OT LONG TERM GOAL #1   Title Pt, husband and caregiver will be mod I with upgraded HEP prn - 04/26/2016   Status Achieved     OT LONG TERM GOAL #2   Title Pt, husband and caregiver will be mod I with splint wear and care    Status Achieved     OT LONG TERM GOAL #3   Title Pt will be able to wash face with min a only   Status Achieved     OT LONG TERM GOAL #4   Title Pt will be able to sit without UE support with supevision 100% of the time with no more than 2 vc's while engaged in functional activity.   Status Achieved     OT LONG TERM GOAL #5   Title Pt, husband and caregiver will verbalize understanding of potential after care therapeutic opportunities. - 07/08/2016 date adjusted as pt missed 2 weeks due to hospitalization   Status On-going     OT LONG TERM GOAL #6   Title Pt will be supervision to wash face consistently   Status On-going     OT LONG TERM GOAL #7   Title Pt will don shirt with min a and max vc's 50% of the time   Status On-going     OT LONG TERM GOAL #8   Title Pt will require min a for stand step transfers to toilet using grab bar for community toileting at least 75% of the time.   Status On-going     OT LONG TERM GOAL  #9   Baseline Pt will stand with contact gurard with only 1 UE support for static standing balance in prep for abiilty to engage in functional task while standing.    Status On-going               Plan -  05/24/16 1637    Clinical Impression Statement Pt with progress toward goals - pt with very slow improvement in postural orientaiton and control.     Rehab Potential  Fair   Clinical Impairments Affecting Rehab Potential severity of deficits, very slow rate of recovery   OT Frequency 2x / week   OT Duration 8 weeks   OT Treatment/Interventions Self-care/ADL training;Ultrasound;Moist Heat;Electrical Stimulation;DME and/or AE instruction;Neuromuscular education;Therapeutic exercise;Functional Mobility Training;Manual Therapy;Passive range of motion;Splinting;Therapeutic activities;Balance training;Patient/family education;Visual/perceptual remediation/compensation;Cognitive remediation/compensation   Plan NMR for functional use of UE;s, static to dynamic standing balance, postural control and orientation, trunk control, functional mobility   Consulted and Agree with Plan of Care Patient;Family member/caregiver   Family Member Consulted caregiver [name], husband Judy Spears       Patient will benefit from skilled therapeutic intervention in order to improve the following deficits and impairments:  Decreased endurance, Decreased skin integrity, Impaired vision/preception, Improper body mechanics, Impaired perceived functional ability, Decreased knowledge of precautions, Cardiopulmonary status limiting activity, Decreased activity tolerance, Decreased knowledge of use of DME, Decreased strength, Impaired flexibility, Improper spinal/pelvic alignment, Impaired sensation, Difficulty walking, Decreased mobility, Decreased balance, Decreased cognition, Decreased range of motion, Impaired tone, Impaired UE functional use, Decreased safety awareness, Decreased coordination, Pain  Visit Diagnosis: Unsteadiness on feet  Abnormal posture  Muscle weakness (generalized)  Other symptoms and signs involving the nervous system  Stiffness of right shoulder, not elsewhere classified  Stiffness of left shoulder, not elsewhere classified  Stiffness of left elbow, not elsewhere classified  Frontal lobe and executive function deficit    Problem List Patient Active Problem List    Diagnosis Date Noted  . Heterotopic ossification of bone 03/10/2016  . Acute on chronic respiratory failure with hypoxia ([place])   . Acute hypoxemic respiratory failure ([place])   . Acute blood loss anemia   . Atelectasis 02/04/2016  . Debility 02/03/2016  . Pneumonia of both lower lobes due to Pseudomonas species ([place])   . Pneumonia of both lower lobes due to methicillin susceptible Staphylococcus aureus (MSSA) ([place])   . Acute respiratory failure with hypoxia ([place])   . Acute encephalopathy   . Seizures ([place])   . Sepsis ([place])   . Hypoxia   . Pain   . HCAP (healthcare-associated pneumonia) 12/28/2015  . Chronic respiratory failure ([place]) 12/28/2015  . Acute tracheobronchitis 12/24/2015  . Tracheostomy tube present ([place])   . Tracheal stenosis   . Chronic respiratory failure with hypoxia ([place])   . 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 Tracheostomy status ([place]) 09/26/2015  . Allergic rhinitis 09/26/2015  . 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 Movement disorder 09/22/2015  . Encephalitis 09/22/2015  . Chest pain 02/07/2014  . Premature atrial contractions 01/13/2014  . Abnormality of gait 12/04/2013  . Hyperlipidemia 12/21/2011  . Cardiovascular risk factor 12/21/2011  . Bunion 01/31/2008  . Metatarsalgia of both feet 09/20/2007  . FLAT FOOT 09/20/2007  . HALLUX RIGIDUS, ACQUIRED 09/20/2007    Quay Burow, OTR/L 05/24/2016, 4:41 PM  [facility] [address] [place], [place], [address] Phone: [phone]   Fax:  [phone]  Name: BERA PINELA MRN: [id number] Date of Birth: 04-23-1950

## 2016-05-25 ENCOUNTER — Encounter: Payer: Self-pay | Admitting: Adult Health

## 2016-05-25 ENCOUNTER — Ambulatory Visit (INDEPENDENT_AMBULATORY_CARE_PROVIDER_SITE_OTHER): Payer: Medicare Other | Admitting: Adult Health

## 2016-05-25 DIAGNOSIS — J189 Pneumonia, unspecified organism: Secondary | ICD-10-CM | POA: Diagnosis not present

## 2016-05-25 DIAGNOSIS — Z930 Tracheostomy status: Secondary | ICD-10-CM | POA: Diagnosis not present

## 2016-05-25 NOTE — Patient Instructions (Signed)
Begin incentive spirometry and Flutter valve Twice daily  .  Continue with PT and OT as tolerated.  Continue with Trach care .  Follow up up Dr. Lake Bells in 3 weeks as planned and As needed

## 2016-05-25 NOTE — Assessment & Plan Note (Signed)
Recent admission , clinically improving  Consider repeat cxr on return in 3 weeks.

## 2016-05-25 NOTE — Assessment & Plan Note (Signed)
Doing well with trach capped, O2 sats adequate For now remain with trach w/out change . She has a poor cough and severe deconditioning (although making progress)  Would add IC/Flutter . On return can look to see if cough is improved. Can look to see if decannulation is possibility on return .

## 2016-05-25 NOTE — Progress Notes (Signed)
_0  ID: Roderic Scarce, female    DOB: 08-30-1950, 66 y.o.   MRN: [id number]  Chief Complaint  Patient presents with  . Follow-up    Referring provider: Cari Caraway, MD  HPI: In November she 2016 was bitten by a tick and then within 2 weeks had a progressive neurologic illness. Specifically she was found unresponsive by her husband while [place] of [place]. She was brought to local hospital where she required intubation due to unresponsiveness. Tracheostomy was performed on hospital day 2 and extensive workup for her neurologic illness begun. 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 She was then transferred for inpatient rehabilitation to the [facility] in [place]. While there she continued to participate in physical therapy and occupational therapy. However, during this time she was never able to consistently take by mouth and all nutrition has been provided the enteral feeding since around the time of her admission to the intensive care unit.  The tracheostomy has been in place since her original hospitalization in December 2016. It was briefly removed in May of 2017 but unfortunately she had a complication of laryngospasm and it sounds like the tracheostomy was replaced approximately 2 weeks later.  The tracheostomy was removed on 11/12/2015.  Admitted 12/28/15 w/ resp distress w/ multilobar PNA requiring intubation and re-trach on 12/3 w/ #6 trach.   05/25/2016  Follow up : Post hospital follow up -PNA /Trach dependent RF  Patient presents for a post hospital follow-up. She is accompanied by her husband and caregiver. She was admitted April 12 through April 17 for acute respiratory failure secondary to HCAP  Pt was treated with abx . She is quite tired today as she just finished PT /Horse therapy.  Husband provides most of information . She has been going to rehab 2 days a  week along with horse therapy training . She is making progress. She has no increased cough/congestion . Her trach remains capped.  She has been decannulated x2 in past. And had to be re-trached due to resp distress. She is making progress with rehab but can not stand without assistance and requires nocturnal tube feeds due to dysphagia. She has a very poor /weak cough .  Husband is concerned that if they decannulate now it will not last . O2 sats remains good in mid 90s on room air with trach capped. No fever, diarrhea, change in behavior .   ENT consult during hospitalization by Dr. Constance Holster  Recommended that she will need a 3 layer closure under anesthesia when ready to have trach out.   Allergies  Allergen Reactions  . Cefepime Other (See Comments)    Status epilepticus  . Tetracyclines & Related Photosensitivity and Other (See Comments)    Blisters  . Pollen Extract Other (See Comments)    Running nose    Immunization History  Administered Date(s) Administered  . Influenza Split 10/26/2015  . Pneumococcal Polysaccharide-23 11/09/2015    Past Medical History:  Diagnosis Date  . Arthritis    lt great toe  . Complication of anesthesia    pt has had headaches post op-did advise to hydra well preop  . Hair loss    right-sided  . History of exercise stress test    a. ETT (12/15) with 12:00 exercise, no ST changes, occasional PACs.   . Hyperlipidemia   . Insomnia   . Migraine headache   .  Movement disorder    resltess in left legs  . Postmenopausal   . Premature atrial contractions    a. Holter (12/15) with 8% PACs, no atrial runs or atrial fibrillation.     Tobacco History: History  Smoking Status  . Never Smoker  Smokeless Tobacco  . Never Used   Counseling given: Not Answered   Outpatient Encounter Prescriptions as of 05/25/2016  Medication Sig  . amantadine (SYMMETREL) 50 MG/5ML solution Place 15 mLs (150 mg total) into feeding tube 2 (two) times daily. In am and at  noon  . amLODipine (NORVASC) 5 MG tablet Place 1 tablet (5 mg total) into feeding tube daily.  [name] atorvastatin (LIPITOR) 20 MG tablet Place 20 mg into feeding tube at bedtime.   . baclofen (LIORESAL) 10 MG tablet Place 1 tablet (10 mg total) into feeding tube every 6 (six) hours.  . chlorhexidine gluconate, MEDLINE KIT, (PERIDEX) 0.12 % solution 15 mLs by Mouth Rinse route 2 (two) times daily. (Patient taking differently: 5 mLs by Mouth Rinse route 2 (two) times daily. )  . Coenzyme Q10 (COQ10 PO) Take 10 mLs by mouth at bedtime.  . diazepam (VALIUM) 2 MG tablet PLACE 1 TABLET INTO FEEDING TUBE AT BEDTIME  . docusate (COLACE) 60 MG/15ML syrup Place 60 mg into feeding tube daily. Tales once daily, but can take 1 addt'l dose as needed for constipation  . famotidine (PEPCID) 20 MG tablet Take 1 tablet (20 mg total) by mouth 2 (two) times daily.  . fluticasone (FLONASE) 50 MCG/ACT nasal spray Place 1 spray into both nostrils 2 (two) times daily.  [name] guaiFENesin (ROBITUSSIN) 100 MG/5ML SOLN Place 10 mLs (200 mg total) into feeding tube 4 (four) times daily.  . Hydrocortisone (GERHARDT'S BUTT CREAM) CREA Apply 1 application topically 4 (four) times daily.  [name] loratadine (CLARITIN) 10 MG tablet Place 1 tablet (10 mg total) into feeding tube daily as needed for allergies. (Patient taking differently: Place 10 mg into feeding tube every morning. )  . Melatonin 3 MG TABS Place 6 mg into feeding tube every evening.  . Multiple Vitamin (MULTIVITAMIN) LIQD Place 15 mLs into feeding tube daily.  [name] nystatin (MYCOSTATIN/NYSTOP) powder Apply 1 g topically 2 (two) times daily as needed. (Patient taking differently: Apply 1 g topically 2 (two) times daily as needed (irritation). )  . potassium chloride 20 MEQ/15ML (10%) SOLN Place 15 mLs (20 mEq total) into feeding tube daily.  . Probiotic Product (PROBIOTIC ADVANCED) CAPS Take 1 capsule by mouth daily.  . prochlorperazine (COMPAZINE) 5 MG tablet Place 1-2 tablets (5-10  mg total) into feeding tube every 6 (six) hours as needed for nausea.  . simethicone (MYLICON) 40 MG/0.6ML drops Place 0.6 mLs (40 mg total) into feeding tube 4 (four) times daily as needed for flatulence.  . valACYclovir (VALTREX) 500 MG tablet Place 500 mg into feeding tube every morning.   . Water For Irrigation, Sterile (FREE WATER) SOLN Place 150 mLs into feeding tube See admin instructions. Takes 5 times daily, if over 234ml-hold water and recheck within an hour   No facility-administered encounter medications on file as of 05/25/2016.      Review of Systems  Constitutional:   No  weight loss, night sweats,  Fevers, chills,  +fatigue, or  lassitude.  HEENT:   No headaches,  Difficulty swallowing,  Tooth/dental problems, or  Sore throat,                No sneezing, itching,  ear ache, nasal congestion, post nasal drip,   CV:  No chest pain,  Orthopnea, PND, swelling in lower extremities, anasarca, dizziness, palpitations, syncope.   GI  No heartburn, indigestion, abdominal pain, nausea, vomiting, diarrhea, change in bowel habits, loss of appetite, bloody stools.   Resp:    No chest wall deformity  Skin: no rash or lesions.  GU: no dysuria, change in color of urine, no urgency or frequency.  No flank pain, no hematuria   MS:  Chronic weakness , going to PT/OT     Physical Exam  BP 124/78 (BP Location: Left Arm, Cuff Size: Normal)   Pulse 74   LMP 12/01/2010   SpO2 97%   GEN: A/Ox3; pleasant , NAD, chronically ill appearing in wc    HEENT:  [place]/AT,   no lesions, no postnasal drip   NECK:  Supple w/ fair ROM; no JVD; normal carotid impulses w/o bruits; no thyromegaly or nodules palpated; no lymphadenopathy.  Trach is midline, capped dressing clean/dry   RESP  Clear  P & A; w/o, wheezes/ rales/ or rhonchi. no accessory muscle use, no dullness to percussion, very poor cough .   CARD:  RRR, no m/r/g, tr  peripheral edema, pulses intact, no cyanosis or clubbing.  GI:    Soft & nt; nml bowel sounds; no organomegaly or masses detected.  , feeding tube noted.   Musco: Warm bil, gen weakness.   Neuro: alert,  .    Skin: Warm, no lesions or rashes   Lab Results:  CBC  BNP No results found for: BNP  ProBNP No results found for: PROBNP  Imaging: Dg Chest Port 1 View  Result Date: 05/09/2016 CLINICAL DATA:  Acute on chronic respiratory failure. EXAM: PORTABLE CHEST 1 VIEW COMPARISON:  One-view chest x-ray 05/07/2016. FINDINGS: The heart size is exaggerated by low lung volumes. Moderate pulmonary vascular congestion is increasing. Patchy bibasilar airspace disease is noted. There is fluid in the minor fissure. Tracheostomy tube is stable. IMPRESSION: 1. Increasing pulmonary vascular congestion. 2. Patchy bibasilar airspace disease may reflect atelectasis. Lower lobar pneumonia is not excluded. Electronically Signed   By: San Morelle M.D.   On: 05/09/2016 12:24   Dg Chest Port 1 View  Result Date: 05/07/2016 CLINICAL DATA:  Pneumonia. EXAM: PORTABLE CHEST 1 VIEW COMPARISON:  Radiograph of May 06, 2016. FINDINGS: Stable cardiomediastinal silhouette. No pneumothorax is noted. Minimal left basilar subsegmental atelectasis is noted. Increased right perihilar opacity is noted concerning for pneumonia or atelectasis. Right basilar atelectasis or scarring is again noted. No significant pleural effusion is noted. Bony thorax is unremarkable. IMPRESSION: Right basilar atelectasis or scarring is again noted. Increased right perihilar opacity is noted concerning for pneumonia or atelectasis. Electronically Signed   By: Marijo Conception, M.D.   On: 05/07/2016 07:34   Dg Chest Port 1 View  Result Date: 05/06/2016 CLINICAL DATA:  Cough. EXAM: PORTABLE CHEST 1 VIEW COMPARISON:  02/19/2016 FINDINGS: The cardiac silhouette, mediastinal and hilar contours are within normal limits and stable. Stable tortuosity of the thoracic aorta. Chronic right basilar opacity since  December 2017 most consistent with post pneumonic scarring change. Low lung volumes with vascular crowding and bibasilar atelectasis. No definite acute infiltrates, pulmonary edema or pleural effusion. IMPRESSION: Chronic right basilar scarring type changes. No definite acute overlying pulmonary process. Electronically Signed   By: Marijo Sanes M.D.   On: 05/06/2016 18:35     Assessment & Plan:   HCAP (healthcare-associated pneumonia) Recent admission ,  clinically improving  Consider repeat cxr on return in 3 weeks.    Tracheostomy status ([place]) Doing well with trach capped, O2 sats adequate For now remain with trach w/out change . She has a poor cough and severe deconditioning (although making progress)  Would add IC/Flutter . On return can look to see if cough is improved. Can look to see if decannulation is possibility on return .       Rexene Edison, NP 05/25/2016

## 2016-05-27 ENCOUNTER — Ambulatory Visit: Payer: Medicare Other | Admitting: Speech Pathology

## 2016-05-27 ENCOUNTER — Ambulatory Visit: Payer: Medicare Other | Admitting: Occupational Therapy

## 2016-05-27 ENCOUNTER — Ambulatory Visit: Payer: Medicare Other | Attending: Physical Medicine & Rehabilitation | Admitting: Physical Therapy

## 2016-05-27 ENCOUNTER — Encounter: Payer: Self-pay | Admitting: Occupational Therapy

## 2016-05-27 DIAGNOSIS — R41841 Cognitive communication deficit: Secondary | ICD-10-CM

## 2016-05-27 DIAGNOSIS — R1312 Dysphagia, oropharyngeal phase: Secondary | ICD-10-CM

## 2016-05-27 DIAGNOSIS — M25622 Stiffness of left elbow, not elsewhere classified: Secondary | ICD-10-CM | POA: Diagnosis present

## 2016-05-27 DIAGNOSIS — R41844 Frontal lobe and executive function deficit: Secondary | ICD-10-CM | POA: Insufficient documentation

## 2016-05-27 DIAGNOSIS — R471 Dysarthria and anarthria: Secondary | ICD-10-CM | POA: Insufficient documentation

## 2016-05-27 DIAGNOSIS — M25612 Stiffness of left shoulder, not elsewhere classified: Secondary | ICD-10-CM

## 2016-05-27 DIAGNOSIS — R2689 Other abnormalities of gait and mobility: Secondary | ICD-10-CM | POA: Diagnosis not present

## 2016-05-27 DIAGNOSIS — R2681 Unsteadiness on feet: Secondary | ICD-10-CM | POA: Diagnosis present

## 2016-05-27 DIAGNOSIS — R29818 Other symptoms and signs involving the nervous system: Secondary | ICD-10-CM | POA: Diagnosis present

## 2016-05-27 DIAGNOSIS — R293 Abnormal posture: Secondary | ICD-10-CM | POA: Diagnosis present

## 2016-05-27 DIAGNOSIS — M6281 Muscle weakness (generalized): Secondary | ICD-10-CM | POA: Diagnosis present

## 2016-05-27 DIAGNOSIS — M25611 Stiffness of right shoulder, not elsewhere classified: Secondary | ICD-10-CM

## 2016-05-27 DIAGNOSIS — R278 Other lack of coordination: Secondary | ICD-10-CM | POA: Diagnosis present

## 2016-05-27 NOTE — Therapy (Signed)
[facility] [address] [place], [place], [address] Phone: [phone]   Fax:  [phone]  Speech Language Pathology Treatment  Patient Details  Name: Judy Spears MRN: [id number] Date of Birth: 1951/01/23 Referring Provider: Charlett Blake, MD  Encounter Date: 05/27/2016      End of Session - 05/27/16 1504    Visit Number 23   Number of Visits 33   Date for SLP Re-Evaluation 06/25/16   SLP Start Time 6789   SLP Stop Time  1445   SLP Time Calculation (min) 43 min   Activity Tolerance Patient tolerated treatment well      Past Medical History:  Diagnosis Date  . Arthritis    lt great toe  . Complication of anesthesia    pt has had headaches post op-did advise to hydra well preop  . Hair loss    right-sided  . History of exercise stress test    a. ETT (12/15) with 12:00 exercise, no ST changes, occasional PACs.   . Hyperlipidemia   . Insomnia   . Migraine headache   . Movement disorder    resltess in left legs  . Postmenopausal   . Premature atrial contractions    a. Holter (12/15) with 8% PACs, no atrial runs or atrial fibrillation.     Past Surgical History:  Procedure Laterality Date  . BREAST BIOPSY  01/01/2011   Procedure: BREAST BIOPSY WITH NEEDLE LOCALIZATION;  Surgeon: Rolm Bookbinder, MD;  Location: [facility];  Service: General;  Laterality: Left;  left breast wire localization biopsy  . cataracts right eye    . CESAREAN SECTION  1986  . HERNIA REPAIR  2000   RIH  . opirectinal membrane peel    . RHINOPLASTY  1983    There were no vitals filed for this visit.      Subjective Assessment - 05/27/16 1414    Subjective "Alright" barely audible   Patient is accompained by: Family member   Special Tests spouse, [name]; caregiver [name]   Pain Score 0-No pain               ADULT SLP TREATMENT - 05/27/16 1416      General Information   Behavior/Cognition  Alert;Cooperative;Pleasant mood   Patient Positioning Upright in chair   Oral care provided Yes     Treatment Provided   Treatment provided Dysphagia;Cognitive-Linquistic     Dysphagia Treatment   Temperature Spikes Noted No   Respiratory Status Room air   Oral Cavity - Dentition Adequate natural dentition   Treatment Methods Skilled observation;Therapeutic exercise;Compensation strategy training;Upgraded PO texture trial;Patient/caregiver education   Patient observed directly with PO's Yes   Type of PO's observed Thin liquids   Liquids provided via Cup   Oral Phase Signs & Symptoms Anterior loss/spillage;Prolonged bolus formation;Oral holding   Pharyngeal Phase Signs & Symptoms Delayed cough   Type of cueing Verbal;Tactile;Visual   Amount of cueing Moderate   Other treatment/comments Oral care performed; mild amount of dried whitish secretions removed from posterior aspect of tongue. Pt's swallow response is timely; she continues to present with anterior loss, however with consistent min-mod verbal cues in 80% of trials she retains bolus/secretions in oral cavity and clears with subsequent dry swallow. She initiates secondary swallow independently in 50% of trials. Vocal quality clear, no overt signs of reduced airway protection with the exception of delayed cough x1, which appears to be secretions vs. bolus. Pt consumed approximately [employer]  thin water.      Pain Assessment   Pain Assessment No/denies pain     Cognitive-Linquistic Treatment   Treatment focused on Voice;Dysarthria;Patient/family/caregiver education   Skilled Treatment Pt was repositioned during session for optimal breath support and voicing. Pt acheived functional voicing speaking one word at a time of 5-7 word phrases with consistent mod A written, visual, demo, and verbal cues approx 80% of the time, and adequate breath 60% of the time with visual and verbal cues frequently.     Tracheostomy Shiley 4 mm Uncuffed    Tracheostomy Properties Placement Date: 05/08/16 Placement Time: 1030 Placed By: ICU physician Brand: Shiley Size (mm): 4 mm Style: Uncuffed   Status Capped   Site Assessment Clean;Dry   Ties Assessment Clean;Dry;Secure     Assessment / Recommendations / Plan   Plan Continue with current plan of care     Dysphagia Recommendations   Diet recommendations Other(comment)  Free Water Protocol   Liquids provided via Teaspoon;Cup   Medication Administration Via alternative means   Supervision Full supervision/cueing for compensatory strategies;Trained caregiver to feed patient   Compensations Effortful swallow;Multiple dry swallows after each bite/sip;Check for anterior loss;Small sips/bites;Slow rate   Postural Changes and/or Swallow Maneuvers Seated upright 90 degrees     Progression Toward Goals   Progression toward goals Progressing toward goals          SLP Education - 05/27/16 1504    Education provided Yes   Education Details potential benefits of RMT for speech, swallowing, lip closure to retain saliva/bolus   Person(s) Educated Patient;Spouse;Caregiver(s)   Methods Explanation;Demonstration;Verbal cues   Comprehension Verbalized understanding;Returned demonstration          SLP Short Term Goals - 05/27/16 1512      SLP SHORT TERM GOAL #1   Title Patient will utilize intelligibility strategies with moderate visual cues from communication partner/visual aid to communicate 5-7 word wants, needs, thoughts and ideas.   Baseline 03/15/16, 04/05/16, 04/12/16, 04/19/16, 05/27/16   Time 1   Period Weeks   Status Achieved     SLP SHORT TERM GOAL #2   Title Patient will utilize intelligibility strategies, breath support with mod A for phrase repetition in 75% of trials.   Baseline 2.12.18, 04/05/16, 04/08/16, 04/12/16   Time 1   Period Weeks   Status Achieved     SLP SHORT TERM GOAL #3   Title Pt will swallow thin liquids, purees and soft solids in 9/10 trials with no overt signs of  aspiration.   Baseline 2.12.18, 03-12-16, 03-15-16   Time 4   Period Weeks   Status Achieved     SLP SHORT TERM GOAL #4   Title Patient will tolerate 6oz water, >95% with no overt signs of aspiration.   Time 1   Period Weeks   Status Achieved     SLP SHORT TERM GOAL #5   Title Patient will utilize verbal cues for lip closure, dry swallow for secretion management in 75% of opportunities.   Baseline 80% mod cues 4/30 ; 5/3-  80% min-mod cues   Time 1   Period Weeks   Status Achieved          SLP Long Term Goals - 05/27/16 1517      SLP LONG TERM GOAL #1   Title Patient will utilize multimodal communication/AAC to clarify wants, needs, thoughts or ideas during communication breakdowns with max A   Time 2   Period Weeks   Status Deferred  SLP LONG TERM GOAL #2   Title Patient will utilize intelligibility strategies, breath support with min A visual cues for 5-7 word responses with 80% accuracy.   Time 4   Period Weeks   Status On-going     SLP LONG TERM GOAL #3   Title Patient will demonstrate readiness for MBS to determine safety for pleasure feeds by timely initiation of swallow in 3/5 trials with ice chips/H20.   Baseline 2.12.18   Time 2   Period Weeks   Status Achieved     SLP LONG TERM GOAL #4   Title Patient will adequately masticate and clear soft solids with mod verbal cues and no overt signs of aspiration in 4/5 trials.   Baseline 04/19/16: chip reports whole bowl of pureed soup   Time 5   Period Weeks   Status Deferred     SLP LONG TERM GOAL #5   Title Patient will utilize visual cue for lip closure, dry swallow for secretion management in 75% of opportunities.   Time 4   Period Weeks   Status On-going          Plan - 05/27/16 1512    Clinical Impression Statement Patient remained alert and attentive during session with no need for external cuing. Pt demontrating improvements in oral containment, dry swallow initiation. Recent visit with pulmonary  NP, who noted poor cough and severe deconditioning, recommended IC/Flutter device. Recommended continuing Free Water Protocol only at this time, no other POs. Anticipate return to POs as pt improves, however PO goals have been adjusted given medical setback. Reduced breath support impacting speech intelligibility; pt required consistent mod A today. Pt may benefit from RMT training for speech and swallowing. Recommend cont' d skilled ST in order to maximize functional communication, swallowing safety, reduce caregiver burden and improve quality of life.   Speech Therapy Frequency 2x / week   Treatment/Interventions Aspiration precaution training;Trials of upgraded texture/liquids;Compensatory strategies;Functional tasks;Patient/family education;Cueing hierarchy;Diet toleration management by SLP;Cognitive reorganization;Compensatory techniques;SLP instruction and feedback;Internal/external aids;Multimodal communcation approach   Potential to Achieve Goals Good   Potential Considerations Ability to learn/carryover information;Co-morbidities;Severity of impairments   SLP Home Exercise Plan free water protocol only PO at this time, continue functional phrases   Consulted and Agree with Plan of Care Family member/caregiver;Patient   Family Member Consulted husband chip, caregiver lisa      Patient will benefit from skilled therapeutic intervention in order to improve the following deficits and impairments:   Dysarthria and anarthria  Dysphagia, oropharyngeal phase  Cognitive communication deficit    Problem List Patient Active Problem List   Diagnosis Date Noted  . Heterotopic ossification of bone 03/10/2016  . Acute on chronic respiratory failure with hypoxia ([facility])   . Acute hypoxemic respiratory failure ([facility])   . Acute blood loss anemia   . Atelectasis 02/04/2016  . Debility 02/03/2016  . Pneumonia of both lower lobes due to Pseudomonas species ([facility])   . Pneumonia of both lower lobes due  to methicillin susceptible Staphylococcus aureus (MSSA) ([facility])   . Acute respiratory failure with hypoxia ([place])   . Acute encephalopathy   . Seizures ([facility])   . Sepsis ([facility])   . Hypoxia   . Pain   . HCAP (healthcare-associated pneumonia) 12/28/2015  . Chronic respiratory failure ([place] [facility]) 12/28/2015  . Acute tracheobronchitis 12/24/2015  . Tracheostomy tube present ([facility])   . Tracheal stenosis   . Chronic respiratory failure with hypoxia ([facility])   . 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[facility]) 10/20/2015  .  Tracheostomy status ([place]) 09/26/2015  . Allergic rhinitis 09/26/2015  . 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 Movement disorder 09/22/2015  . Encephalitis 09/22/2015  . Chest pain 02/07/2014  . Premature atrial contractions 01/13/2014  . Abnormality of gait 12/04/2013  . Hyperlipidemia 12/21/2011  . Cardiovascular risk factor 12/21/2011  . Bunion 01/31/2008  . Metatarsalgia of both feet 09/20/2007  . FLAT FOOT 09/20/2007  . HALLUX RIGIDUS, ACQUIRED 09/20/2007   Deneise Lever, [place], [place] 05/27/2016, 3:19 PM  [facility] [address] [place], [place], [address] Phone: [phone]   Fax:  [phone]   Name: Judy Spears MRN: [id number] Date of Birth: May 25, 1950

## 2016-05-27 NOTE — Therapy (Signed)
[facility] [address] [place], [place], [address] Phone: [phone]   Fax:  [phone]  Occupational Therapy Treatment  Patient Details  Name: Judy Spears MRN: [id number] Date of Birth: 09-25-1950 Referring Provider: Dr. Quita Skye. [name]  Encounter Date: 05/27/2016      OT End of Session - 05/27/16 1558    Visit Number 22   Number of Visits 32   Date for OT Re-Evaluation 07/08/16   Authorization Type [employer] unlimited visits   OT Start Time 1448   OT Stop Time 1531   OT Time Calculation (min) 43 min   Activity Tolerance Patient tolerated treatment well      Past Medical History:  Diagnosis Date  . Arthritis    lt great toe  . Complication of anesthesia    pt has had headaches post op-did advise to hydra well preop  . Hair loss    right-sided  . History of exercise stress test    a. ETT (12/15) with 12:00 exercise, no ST changes, occasional PACs.   . Hyperlipidemia   . Insomnia   . Migraine headache   . Movement disorder    resltess in left legs  . Postmenopausal   . Premature atrial contractions    a. Holter (12/15) with 8% PACs, no atrial runs or atrial fibrillation.     Past Surgical History:  Procedure Laterality Date  . BREAST BIOPSY  01/01/2011   Procedure: BREAST BIOPSY WITH NEEDLE LOCALIZATION;  Surgeon: Rolm Bookbinder, MD;  Location: [facility] [facility];  Service: General;  Laterality: Left;  left breast wire localization biopsy  . cataracts right eye    . CESAREAN SECTION  1986  . HERNIA REPAIR  2000   RIH  . opirectinal membrane peel    . RHINOPLASTY  1983    There were no vitals filed for this visit.      Subjective Assessment - 05/27/16 1452    Subjective  I am hot   Patient is accompained by: Family member  Husband [name], caregiver Leesa   Pertinent History see epic snapshot - ABI/encephalitis/hypoxia from virus; recent hospitalization due to ARF;    Patient Stated  Goals Pt unable to state.    Currently in Pain? No/denies                      OT Treatments/Exercises (OP) - 05/27/16 0001      Neurological Re-education Exercises   Other Exercises 2 Neuro re ed to address sit to stand, static and dynamic standing balance, postural orientaiton and control, standing with bilateral UE support. Pt needs mod facilitation for thoraxic extension and for head alignment in standing.  ALso addressed functional ambulation both with and and without device.  Pt needs cues and  mod facilitation to transition forward over leg in stance before stepping with alternate leg.  Pt also with significant R bias but improving in her ability to shift to her left in static standing with UE support.  Addresed stand step transfer as well as stepping backward.                   OT Short Term Goals - 05/27/16 1555      OT SHORT TERM GOAL #1   Title Pt, husband and cargiver will be mod I with HEP - 03/29/2016   Status Achieved     OT SHORT TERM GOAL #2   Title Pt will be able to sit  with supervision without UE support and vc's to assist with midline 75% while engage in functional task   Status Achieved     OT SHORT TERM GOAL #3   Title Pt will be able to complete sit to stand consistently with mod A for toilet transfers (max a for pivot)   Status Achieved     OT SHORT TERM GOAL #4   Title Pt, husband and caregiver will verbalize understanding of various bathroom accomodations to reduce long term caregiver burden   Status Achieved     OT SHORT TERM GOAL #5   Title Pt will be able to stand step transfer to toilet with mo more than mod a, mod vc's consistently using grab bar for community toileting needs- 06/10/2016 date adjusted as pt missed 2 weeks due to hospitalization   Status On-going     OT SHORT TERM GOAL #6   Title Pt will be able to wash face with supervision/max cues 50% of the time, min a 50% of the time.    Status On-going     OT SHORT TERM  GOAL #7   Title Pt will be able to don pull over shirt with mod a, max cues   Status On-going     OT SHORT TERM GOAL #8   Title Pt will be able to maintain static standing with UE support with close supervision to reduce burden of care for caregivers    Status On-going           OT Long Term Goals - 05/27/16 1556      OT LONG TERM GOAL #1   Title Pt, husband and caregiver will be mod I with upgraded HEP prn - 04/26/2016   Status Achieved     OT LONG TERM GOAL #2   Title Pt, husband and caregiver will be mod I with splint wear and care    Status Achieved     OT LONG TERM GOAL #3   Title Pt will be able to wash face with min a only   Status Achieved     OT LONG TERM GOAL #4   Title Pt will be able to sit without UE support with supevision 100% of the time with no more than 2 vc's while engaged in functional activity.   Status Achieved     OT LONG TERM GOAL #5   Title Pt, husband and caregiver will verbalize understanding of potential after care therapeutic opportunities. - 07/08/2016 date adjusted as pt missed 2 weeks due to hospitalization   Status On-going     OT LONG TERM GOAL #6   Title Pt will be supervision to wash face consistently   Status On-going     OT LONG TERM GOAL #7   Title Pt will don shirt with min a and max vc's 50% of the time   Status On-going     OT LONG TERM GOAL #8   Title Pt will require min a for stand step transfers to toilet using grab bar for community toileting at least 75% of the time.   Status On-going     OT LONG TERM GOAL  #9   Baseline Pt will stand with contact gurard with only 1 UE support for static standing balance in prep for abiilty to engage in functional task while standing.    Status On-going               Plan - 05/27/16 1556    Clinical Impression Statement Pt with  slow but steady progress toward sit to stand and standing goals.  Pt with severe perceptual deficits as well as motor planning and cognition therefore  will take significant repetition for benefit.    Rehab Potential Fair   Clinical Impairments Affecting Rehab Potential severity of deficits, very slow rate of recovery   OT Frequency 2x / week   OT Duration 8 weeks   OT Treatment/Interventions Self-care/ADL training;Ultrasound;Moist Heat;Electrical Stimulation;DME and/or AE instruction;Neuromuscular education;Therapeutic exercise;Functional Mobility Training;Manual Therapy;Passive range of motion;Splinting;Therapeutic activities;Balance training;Patient/family education;Visual/perceptual remediation/compensation;Cognitive remediation/compensation   Plan NMR for functional use of UE's, static to dynamic standing balance, postural control and orientation, trunk control, functional mobility   Consulted and Agree with Plan of Care Patient;Family member/caregiver   Family Member Consulted caregiver [name], husband [name]       Patient will benefit from skilled therapeutic intervention in order to improve the following deficits and impairments:  Decreased endurance, Decreased skin integrity, Impaired vision/preception, Improper body mechanics, Impaired perceived functional ability, Decreased knowledge of precautions, Cardiopulmonary status limiting activity, Decreased activity tolerance, Decreased knowledge of use of DME, Decreased strength, Impaired flexibility, Improper spinal/pelvic alignment, Impaired sensation, Difficulty walking, Decreased mobility, Decreased balance, Decreased cognition, Decreased range of motion, Impaired tone, Impaired UE functional use, Decreased safety awareness, Decreased coordination, Pain  Visit Diagnosis: Unsteadiness on feet  Abnormal posture  Muscle weakness (generalized)  Other symptoms and signs involving the nervous system  Stiffness of right shoulder, not elsewhere classified  Stiffness of left shoulder, not elsewhere classified  Stiffness of left elbow, not elsewhere classified  Frontal lobe and executive  function deficit    Problem List Patient Active Problem List   Diagnosis Date Noted  . Heterotopic ossification of bone 03/10/2016  . Acute on chronic respiratory failure with hypoxia ([place])   . Acute hypoxemic respiratory failure ([facility])   . Acute blood loss anemia   . Atelectasis 02/04/2016  . Debility 02/03/2016  . Pneumonia of both lower lobes due to Pseudomonas species ([facility])   . Pneumonia of both lower lobes due to methicillin susceptible Staphylococcus aureus (MSSA) ([facility])   . Acute respiratory failure with hypoxia ([facility])   . Acute encephalopathy   . Seizures ([place])   . Sepsis ([facility])   . Hypoxia   . Pain   . HCAP (healthcare-associated pneumonia) 12/28/2015  . Chronic respiratory failure ([facility]) 12/28/2015  . Acute tracheobronchitis 12/24/2015  . Tracheostomy tube present ([place])   . Tracheal stenosis   . Chronic respiratory failure with hypoxia ([facility])   . 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[facility]) 10/20/2015  . Tracheostomy status ([place]) 09/26/2015  . Allergic rhinitis 09/26/2015  . 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 Movement disorder 09/22/2015  . Encephalitis 09/22/2015  . Chest pain 02/07/2014  . Premature atrial contractions 01/13/2014  . Abnormality of gait 12/04/2013  . Hyperlipidemia 12/21/2011  . Cardiovascular risk factor 12/21/2011  . Bunion 01/31/2008  . Metatarsalgia of both feet 09/20/2007  . FLAT FOOT 09/20/2007  . HALLUX RIGIDUS, ACQUIRED 09/20/2007    Quay Burow, OTR/L 05/27/2016, 3:59 PM  [facility] [address] [place], [place], [address] Phone: [phone]   Fax:  [phone]  Name: ERCEL NORMOYLE MRN: [id number] Date of Birth: 04-25-1950

## 2016-05-28 NOTE — Therapy (Signed)
[facility] [address] [place], [place], [address] Phone: [phone]   Fax:  [phone]  Physical Therapy Treatment  Patient Details  Name: Judy Spears MRN: [id number] Date of Birth: 01/31/1950 Referring Provider: Alger Simons, MD/Andrew Letta Pate, MD  Encounter Date: 05/27/2016      PT End of Session - 05/28/16 1518    Visit Number 23   Number of Visits 36   Date for PT Re-Evaluation 06/29/16   Authorization Type BCBS   PT Start Time 1316   PT Stop Time 1404   PT Time Calculation (min) 48 min      Past Medical History:  Diagnosis Date  . Arthritis    lt great toe  . Complication of anesthesia    pt has had headaches post op-did advise to hydra well preop  . Hair loss    right-sided  . History of exercise stress test    a. ETT (12/15) with 12:00 exercise, no ST changes, occasional PACs.   . Hyperlipidemia   . Insomnia   . Migraine headache   . Movement disorder    resltess in left legs  . Postmenopausal   . Premature atrial contractions    a. Holter (12/15) with 8% PACs, no atrial runs or atrial fibrillation.     Past Surgical History:  Procedure Laterality Date  . BREAST BIOPSY  01/01/2011   Procedure: BREAST BIOPSY WITH NEEDLE LOCALIZATION;  Surgeon: Rolm Bookbinder, MD;  Location: [facility];  Service: General;  Laterality: Left;  left breast wire localization biopsy  . cataracts right eye    . CESAREAN SECTION  1986  . HERNIA REPAIR  2000   RIH  . opirectinal membrane peel    . RHINOPLASTY  1983    There were no vitals filed for this visit.      Subjective Assessment - 05/28/16 1514    Subjective Caregiver reports they went for a walk (pt in wheelchair) outside to enjoy the beautiful weather   Pertinent History Precautions:  PEG.  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  Another round of botox in LLE on 02/06/16   Limitations House hold activities;Walking;Standing;Sitting   Patient Stated Goals Per husband, "For Judy Spears to be as independent as possible."   Currently in Pain? No/denies                         [facility] Adult PT Treatment/Exercise - 05/28/16 0001      Transfers   Transfers Sit to Stand   Sit to Stand 4: Min assist   Sit to Stand Details Tactile cues for placement;Verbal cues for technique;Manual facilitation for weight shifting;Verbal cues for gait pattern     Ambulation/Gait   Ambulation/Gait Yes   Ambulation/Gait Assistance 3: Mod assist;2: Max assist   Ambulation Distance (Feet) 230 Feet   Assistive device Other (Comment)  UP walker   Gait Pattern Decreased stance time - right;Decreased hip/knee flexion - right;Decreased hip/knee flexion - left;Decreased weight shift to right;Scissoring;Ataxic   Ambulation Surface Level;Indoor   Gait Comments mod assist to keep Lt trunk elevated by placing hand on  rib cage near axilla region      NeuroRe-ed;  Worked on sit to stand from wheelchair to // bars - 3 reps with min assist:  Worked on static standing balance Inside // bars with bil. UE support - cues to shift weight posteriorly to maintain upright posture Pt performed standing trunk rotation to Rt and Lt sides - 30 secs to each side for trunk rotation stretch - both hands on 1 // bar for Trunk stretch  Pt stood for 10 secs with bil. UE support with close supervision, and longest hold was 14 secs in standing with bil, UE support  Pt appeared fatigue after performing this activity which was done prior to gait training with UP walker            PT Short Term Goals - 05/28/16 1522      PT SHORT TERM GOAL #1   Title Pt will consistently perform supine rolling in B directions at S level (with rails as needed) in order to  indicate improved trunk dissociation and increaed independence with bed mobility.  (Target Date for STGs: 05/28/16)   Baseline min assist needed per husband's report - 05-24-16   Time 4   Period Weeks   Status On-going     PT SHORT TERM GOAL #2   Title Pt will consistently perform R SL>sit at mod A level in order to indicate increased independence with getting out of bed.     Baseline inconsistently met per husband's report - 05-27-16   Time 4   Period Weeks   Status On-going     PT SHORT TERM GOAL #3   Title Pt will consistently perform L SL>sit at min A level in order to indicate increased independene with getting out of bed/off of exercise mat table.    Baseline mod to min assist needed - 05-27-16   Status Partially Met     PT SHORT TERM GOAL #4   Title Pt will consistently perform sit to SL R and L at mod A level in order to indicate improved independence with bed mobility.     Baseline met 05-28-16   Status Achieved     PT SHORT TERM GOAL #5   Title Pt will perform transfers from w/c to seated surface at min/guard level in order to indicate improved independence with transfers.    Baseline mod assist needed - 05-24-16   Status On-going     PT SHORT TERM GOAL #6   Title Pt will perform static standing with BUE support x 3 mins at S level with no overt LOB to decrease burden of care and increase independence with ADLs.    Baseline Pt able to stand for 3" but amount of assist varies - supervision to mod assist depending on fatigue - 05-28-16   Time 4   Period Weeks   Status On-going     PT SHORT TERM GOAL #7   Title Pt will perform dynamic sitting at [facility] with intermittent UE support, reaching outside of BOS x 5 mins in order to indicate safety with ADLs.    Baseline met 05-24-16   Status Achieved           PT Long Term Goals - 05/01/16 0953      PT LONG TERM GOAL #1   Title Pt will perform supine rolling R and L at mod I level in order to indicate improved independence with bed  mobility.  (Target Date: 06/25/16)   Time 8   Period Weeks   Status New     PT LONG TERM GOAL #2   Title Pt will perform sit>SL R or L at min A  level in order to indicate improved independence with getting into bed.    Time 8   Period Weeks   Status New     PT LONG TERM GOAL #3   Title Pt will perform SL R to sit at min A level, SL L to sit at S level in order to indicate increased independence with getting out of bed.     Time 8   Period Weeks   Status New     PT LONG TERM GOAL #4   Title Pt will perform transfers at S level 50% of the time in order to indicate improved functional independence.    Time 8   Period Weeks   Status New     PT LONG TERM GOAL #5   Title Pt will be able to stand with single UE support while performing opposite UE task x 3 mins at S level in order to indicate improved independence with ADLs.    Time 8   Period Weeks   Status New     Additional Long Term Goals   Additional Long Term Goals Yes     PT LONG TERM GOAL #6   Title Pt will demonstrate ability to ambulate up to 25' w/ LRAD at min A level in order to assist with getting around home.     Time 8   Period Weeks   Status New               Plan - 05/28/16 1518    Clinical Impression Statement Pt had more difficulty with ambulation with use of UP walker today - possibly due to fatigue.  Pt leaning anteriorly with mod to max assist to maintain weight shift posteriorly for upright posture.  Pt's RLE was scissoring more frequently on 2nd lap and was getting caught on pt's LLE during swing phase of gait.    Rehab Potential Fair   Clinical Impairments Affecting Rehab Potential Chronicity of condition, continued medical management, but has very strong family and financial support   PT Frequency 2x / week   PT Duration 8 weeks   PT Treatment/Interventions ADLs/Self Care Home Management;DME Instruction;Gait training;Stair training;Functional mobility training;Therapeutic activities;Patient/family  education;Cognitive remediation;Neuromuscular re-education;Therapeutic exercise;Balance training;Orthotic Fit/Training;Wheelchair mobility training;Manual techniques;Splinting;Visual/perceptual remediation/compensation;Vestibular;Passive range of motion;Electrical Stimulation   PT Next Visit Plan gait training; standing balance and transfers   Consulted and Agree with Plan of Care Family member/caregiver   Family Member Consulted caregiver, [name] and husband [name]      Patient will benefit from skilled therapeutic intervention in order to improve the following deficits and impairments:  Abnormal gait, Decreased balance, Decreased endurance, Decreased cognition, Cardiopulmonary status limiting activity, Decreased activity tolerance, Decreased coordination, Decreased strength, Impaired flexibility, Impaired tone, Decreased mobility, Increased muscle spasms, Impaired vision/preception, Decreased range of motion, Impaired UE functional use, Postural dysfunction, Improper body mechanics, Impaired perceived functional ability, Decreased knowledge of precautions, Decreased knowledge of use of DME  Visit Diagnosis: Other abnormalities of gait and mobility  Unsteadiness on feet     Problem List Patient Active Problem List   Diagnosis Date Noted  . Heterotopic ossification of bone 03/10/2016  . Acute on chronic respiratory failure with hypoxia ([place] [facility])   . Acute hypoxemic respiratory failure ([place])   . Acute blood loss anemia   . Atelectasis 02/04/2016  . Debility 02/03/2016  . Pneumonia of both lower lobes due to Pseudomonas species ([place])   . Pneumonia of both lower lobes due to methicillin susceptible Staphylococcus aureus (MSSA) ([facility])   .  Acute respiratory failure with hypoxia ([place])   . Acute encephalopathy   . Seizures ([place])   . Sepsis ([place])   . Hypoxia   . Pain   . HCAP (healthcare-associated pneumonia) 12/28/2015  . Chronic respiratory failure ([place]) 12/28/2015  . Acute tracheobronchitis  12/24/2015  . Tracheostomy tube present ([facility])   . Tracheal stenosis   . Chronic respiratory failure with hypoxia ([place])   . 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[place]) 10/20/2015  . Tracheostomy status ([place]) 09/26/2015  . Allergic rhinitis 09/26/2015  . 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 Movement disorder 09/22/2015  . Encephalitis 09/22/2015  . Chest pain 02/07/2014  . Premature atrial contractions 01/13/2014  . Abnormality of gait 12/04/2013  . Hyperlipidemia 12/21/2011  . Cardiovascular risk factor 12/21/2011  . Bunion 01/31/2008  . Metatarsalgia of both feet 09/20/2007  . FLAT FOOT 09/20/2007  . HALLUX RIGIDUS, ACQUIRED 09/20/2007    Alda Lea, PT 05/28/2016, 3:28 PM  [facility] [address] [place], [place], [address] Phone: [phone]   Fax:  [phone]  Name: LARANDA BURKEMPER MRN: [id number] Date of Birth: Jun 19, 1950

## 2016-05-31 ENCOUNTER — Ambulatory Visit: Payer: Medicare Other | Admitting: Speech Pathology

## 2016-05-31 ENCOUNTER — Encounter: Payer: Self-pay | Admitting: Rehabilitation

## 2016-05-31 ENCOUNTER — Ambulatory Visit: Payer: Medicare Other | Admitting: Rehabilitation

## 2016-05-31 DIAGNOSIS — R2689 Other abnormalities of gait and mobility: Secondary | ICD-10-CM | POA: Diagnosis not present

## 2016-05-31 DIAGNOSIS — R471 Dysarthria and anarthria: Secondary | ICD-10-CM

## 2016-05-31 DIAGNOSIS — R1312 Dysphagia, oropharyngeal phase: Secondary | ICD-10-CM

## 2016-05-31 DIAGNOSIS — R293 Abnormal posture: Secondary | ICD-10-CM

## 2016-05-31 DIAGNOSIS — R2681 Unsteadiness on feet: Secondary | ICD-10-CM

## 2016-05-31 DIAGNOSIS — M6281 Muscle weakness (generalized): Secondary | ICD-10-CM

## 2016-05-31 DIAGNOSIS — R41841 Cognitive communication deficit: Secondary | ICD-10-CM

## 2016-05-31 NOTE — Progress Notes (Signed)
Reviewed, agree 

## 2016-05-31 NOTE — Therapy (Signed)
[facility] [address] [place], [place], [address] Phone: [phone]   Fax:  [phone]  Physical Therapy Treatment  Patient Details  Name: Judy Spears MRN: [id number] Date of Birth: 07-Feb-1950 Referring Provider: Alger Simons, MD/Andrew Letta Pate, MD  Encounter Date: 05/31/2016      PT End of Session - 05/31/16 1402    Visit Number 24   Number of Visits 36   Date for PT Re-Evaluation 06/29/16   Authorization Type BCBS   PT Start Time 1147   PT Stop Time 1235   PT Time Calculation (min) 48 min   Activity Tolerance Patient tolerated treatment well   Behavior During Therapy [facility] for tasks assessed/performed      Past Medical History:  Diagnosis Date  . Arthritis    lt great toe  . Complication of anesthesia    pt has had headaches post op-did advise to hydra well preop  . Hair loss    right-sided  . History of exercise stress test    a. ETT (12/15) with 12:00 exercise, no ST changes, occasional PACs.   . Hyperlipidemia   . Insomnia   . Migraine headache   . Movement disorder    resltess in left legs  . Postmenopausal   . Premature atrial contractions    a. Holter (12/15) with 8% PACs, no atrial runs or atrial fibrillation.     Past Surgical History:  Procedure Laterality Date  . BREAST BIOPSY  01/01/2011   Procedure: BREAST BIOPSY WITH NEEDLE LOCALIZATION;  Surgeon: Rolm Bookbinder, MD;  Location: [facility];  Service: General;  Laterality: Left;  left breast wire localization biopsy  . cataracts right eye    . CESAREAN SECTION  1986  . HERNIA REPAIR  2000   RIH  . opirectinal membrane peel    . RHINOPLASTY  1983    There were no vitals filed for this visit.      Subjective Assessment - 05/31/16 1350    Subjective Pt/caregiver reports going to [employer] on Tuesday and that they had her reach outside of BOS for targets during riding.     Pertinent History Precautions:  PEG.   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  Another round of botox in LLE on 02/06/16   Limitations House hold activities;Walking;Standing;Sitting   Patient Stated Goals Per husband, "For Judy Spears to be as independent as possible."   Currently in Pain? No/denies                         [facility] Adult PT Treatment/Exercise - 05/31/16 0001      Transfers   Transfers Sit to Stand   Sit to Stand 4: Min assist   Sit to Stand Details Tactile cues for placement;Verbal cues for technique;Manual facilitation for weight shifting;Verbal cues for gait pattern   Sit to Stand Details (indicate cue type and reason) Pt demonstrating marked improvement in ability to initiate forward weight shift and reaching forward on LEs to encourage this.  Needs continued facilitation to maintain forward trunk lean when pushing through LEs as this is when she tends to want to push posteriorly.    Stand to Sit 4: Min assist   Stand to Sit Details (indicate cue type and reason) Verbal cues for technique;Tactile cues for placement  Stand to Sit Details Continued cues for increased forward flexion when sitting for controlled descent.    Squat Pivot Transfers 3: Mod assist;With upper extremity assistance   Squat Pivot Transfer Details (indicate cue type and reason) Performed squat pivot from w/c>mat table to see if she was able to increase independence with this type of transfer.  She continues to require up to mod A for this transfer with heavy tendency to lean to the L despite cues and assist for hand placement.       Ambulation/Gait   Ambulation/Gait Yes   Ambulation/Gait Assistance 3: Mod assist  +2 following 20' to decrease forward motion of up walker   Ambulation/Gait Assistance Details Pt continues to demonstrate marked  improvement with gait with up walker.  PT started gait today with brakes locked to decrease forward momentum and trunk lean, however pt relying so heavily on UE support that it was difficult to move walker, therefore had another PT provide assist to maintain fluidity of movement in walker but not allow increased forward momentum.  Pt doing markedly better maintaining upright posture (hips over feet) with less assist needed at trunk to decrease forward trunk lean.  Cues for continued B hip protraction and trunk extension as well as facilitation for these as needed with intermittent facilitation at L hip to increase L lateral weight shift.     Ambulation Distance (Feet) 115 Feet   Assistive device --  up walker   Gait Pattern Decreased stance time - right;Decreased hip/knee flexion - right;Decreased hip/knee flexion - left;Decreased weight shift to right;Scissoring;Ataxic   Ambulation Surface Level;Indoor     Neuro Re-ed    Neuro Re-ed Details  NMR in standing to allow pt to statically stand with emphasis on finding midline and maintaining balance without support.  Pt able to maintain standing today without support (did have light support with RLE on mat) x 15 secs and intermittently throughout session up to 10 secs at a time.  Pt doing much better at self correction of posture and finally has slight reaction to posterior LOB.  In standing had her work on reaching R LE down side of leg for L trunk lengthening as well as performing LUE wash cloth task (wiping table) while maintaining balance.  Once given task, she requires up to mod A to complete task with cues on correcting posture with hips/trunk rather than attempting to use table/UEs.                    PT Short Term Goals - 05/28/16 1522      PT SHORT TERM GOAL #1   Title Pt will consistently perform supine rolling in B directions at S level (with rails as needed) in order to indicate improved trunk dissociation and increaed independence  with bed mobility.  (Target Date for STGs: 05/28/16)   Baseline min assist needed per husband's report - 05-24-16   Time 4   Period Weeks   Status On-going     PT SHORT TERM GOAL #2   Title Pt will consistently perform R SL>sit at mod A level in order to indicate increased independence with getting out of bed.     Baseline inconsistently met per husband's report - 05-27-16   Time 4   Period Weeks   Status On-going     PT SHORT TERM GOAL #3   Title Pt will consistently perform L SL>sit at min A level in order to indicate increased independene with getting out of  bed/off of exercise mat table.    Baseline mod to min assist needed - 05-27-16   Status Partially Met     PT SHORT TERM GOAL #4   Title Pt will consistently perform sit to SL R and L at mod A level in order to indicate improved independence with bed mobility.     Baseline met 05-28-16   Status Achieved     PT SHORT TERM GOAL #5   Title Pt will perform transfers from w/c to seated surface at min/guard level in order to indicate improved independence with transfers.    Baseline mod assist needed - 05-24-16   Status On-going     PT SHORT TERM GOAL #6   Title Pt will perform static standing with BUE support x 3 mins at S level with no overt LOB to decrease burden of care and increase independence with ADLs.    Baseline Pt able to stand for 3" but amount of assist varies - supervision to mod assist depending on fatigue - 05-28-16   Time 4   Period Weeks   Status On-going     PT SHORT TERM GOAL #7   Title Pt will perform dynamic sitting at [facility] with intermittent UE support, reaching outside of BOS x 5 mins in order to indicate safety with ADLs.    Baseline met 05-24-16   Status Achieved           PT Long Term Goals - 05/01/16 0953      PT LONG TERM GOAL #1   Title Pt will perform supine rolling R and L at mod I level in order to indicate improved independence with bed mobility.  (Target Date: 06/25/16)   Time 8   Period Weeks    Status New     PT LONG TERM GOAL #2   Title Pt will perform sit>SL R or L at min A level in order to indicate improved independence with getting into bed.    Time 8   Period Weeks   Status New     PT LONG TERM GOAL #3   Title Pt will perform SL R to sit at min A level, SL L to sit at S level in order to indicate increased independence with getting out of bed.     Time 8   Period Weeks   Status New     PT LONG TERM GOAL #4   Title Pt will perform transfers at S level 50% of the time in order to indicate improved functional independence.    Time 8   Period Weeks   Status New     PT LONG TERM GOAL #5   Title Pt will be able to stand with single UE support while performing opposite UE task x 3 mins at S level in order to indicate improved independence with ADLs.    Time 8   Period Weeks   Status New     Additional Long Term Goals   Additional Long Term Goals Yes     PT LONG TERM GOAL #6   Title Pt will demonstrate ability to ambulate up to 25' w/ LRAD at min A level in order to assist with getting around home.     Time 8   Period Weeks   Status New               Plan - 05/31/16 1403    Clinical Impression Statement Skilled session focused on standing NMR to find midline in  static and dynamic postures progressing to gait with up walker.  Pt did much better today with up walker vs previous session but did have second PT to prevent too much forward translation of walker during gait.  Pt demonstrating better ability to maintain upright posture today (hips over feet) during gait.    Rehab Potential Fair   Clinical Impairments Affecting Rehab Potential Chronicity of condition, continued medical management, but has very strong family and financial support   PT Frequency 2x / week   PT Duration 8 weeks   PT Treatment/Interventions ADLs/Self Care Home Management;DME Instruction;Gait training;Stair training;Functional mobility training;Therapeutic activities;Patient/family  education;Cognitive remediation;Neuromuscular re-education;Therapeutic exercise;Balance training;Orthotic Fit/Training;Wheelchair mobility training;Manual techniques;Splinting;Visual/perceptual remediation/compensation;Vestibular;Passive range of motion;Electrical Stimulation   PT Next Visit Plan gait training; standing balance and transfers, L trunk lengthening, active trunk movement   Consulted and Agree with Plan of Care Family member/caregiver   Family Member Consulted caregiver, Judy Spears and husband Judy Spears      Patient will benefit from skilled therapeutic intervention in order to improve the following deficits and impairments:  Abnormal gait, Decreased balance, Decreased endurance, Decreased cognition, Cardiopulmonary status limiting activity, Decreased activity tolerance, Decreased coordination, Decreased strength, Impaired flexibility, Impaired tone, Decreased mobility, Increased muscle spasms, Impaired vision/preception, Decreased range of motion, Impaired UE functional use, Postural dysfunction, Improper body mechanics, Impaired perceived functional ability, Decreased knowledge of precautions, Decreased knowledge of use of DME  Visit Diagnosis: Unsteadiness on feet  Abnormal posture  Muscle weakness (generalized)  Other abnormalities of gait and mobility     Problem List Patient Active Problem List   Diagnosis Date Noted  . Heterotopic ossification of bone 03/10/2016  . Acute on chronic respiratory failure with hypoxia ([place])   . Acute hypoxemic respiratory failure ([place])   . Acute blood loss anemia   . Atelectasis 02/04/2016  . Debility 02/03/2016  . Pneumonia of both lower lobes due to Pseudomonas species ([place])   . Pneumonia of both lower lobes due to methicillin susceptible Staphylococcus aureus (MSSA) ([place])   . Acute respiratory failure with hypoxia ([place])   . Acute encephalopathy   . Seizures ([place])   . Sepsis ([place])   . Hypoxia   . Pain   . HCAP (healthcare-associated  pneumonia) 12/28/2015  . Chronic respiratory failure ([place]) 12/28/2015  . Acute tracheobronchitis 12/24/2015  . Tracheostomy tube present ([place])   . Tracheal stenosis   . Chronic respiratory failure with hypoxia ([place])   . 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[place]) 10/20/2015  . Tracheostomy status ([place]) 09/26/2015  . Allergic rhinitis 09/26/2015  . 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 Movement disorder 09/22/2015  . Encephalitis 09/22/2015  . Chest pain 02/07/2014  . Premature atrial contractions 01/13/2014  . Abnormality of gait 12/04/2013  . Hyperlipidemia 12/21/2011  . Cardiovascular risk factor 12/21/2011  . Bunion 01/31/2008  . Metatarsalgia of both feet 09/20/2007  . FLAT FOOT 09/20/2007  . HALLUX RIGIDUS, ACQUIRED 09/20/2007    Cameron Sprang, PT, MPT [facility] [address] [place], [place], [address] Phone: [phone]   Fax:  [phone] 05/31/16, 2:06 PM  Name: CAYLI ESCAJEDA MRN: [id number] Date of Birth: 12/01/1950

## 2016-05-31 NOTE — Therapy (Signed)
[facility] [address] [place], [place], [address] Phone: [phone]   Fax:  [phone]  Speech Language Pathology Treatment  Patient Details  Name: Judy Spears MRN: [id number] Date of Birth: 16-Jul-1950 Referring Provider: Charlett Blake, MD  Encounter Date: 05/31/2016      End of Session - 05/31/16 1724    Visit Number 24   Number of Visits 33   Date for SLP Re-Evaluation 06/25/16   SLP Start Time 1105   SLP Stop Time  4917   SLP Time Calculation (min) 40 min      Past Medical History:  Diagnosis Date  . Arthritis    lt great toe  . Complication of anesthesia    pt has had headaches post op-did advise to hydra well preop  . Hair loss    right-sided  . History of exercise stress test    a. ETT (12/15) with 12:00 exercise, no ST changes, occasional PACs.   . Hyperlipidemia   . Insomnia   . Migraine headache   . Movement disorder    resltess in left legs  . Postmenopausal   . Premature atrial contractions    a. Holter (12/15) with 8% PACs, no atrial runs or atrial fibrillation.     Past Surgical History:  Procedure Laterality Date  . BREAST BIOPSY  01/01/2011   Procedure: BREAST BIOPSY WITH NEEDLE LOCALIZATION;  Surgeon: Rolm Bookbinder, MD;  Location: [facility];  Service: General;  Laterality: Left;  left breast wire localization biopsy  . cataracts right eye    . CESAREAN SECTION  1986  . HERNIA REPAIR  2000   RIH  . opirectinal membrane peel    . RHINOPLASTY  1983    There were no vitals filed for this visit.      Subjective Assessment - 05/31/16 1108    Subjective "Hi" "Fine" good volume   Patient is accompained by: Family member   Special Tests spouse, [name]; caregiver [name]   Currently in Pain? No/denies               ADULT SLP TREATMENT - 05/31/16 1109      General Information   Behavior/Cognition Alert;Cooperative;Pleasant mood   Patient Positioning  Upright in chair   Oral care provided N/A     Treatment Provided   Treatment provided Cognitive-Linquistic     Dysphagia Treatment   Temperature Spikes Noted No   Respiratory Status Room air   Oral Cavity - Dentition Adequate natural dentition   Treatment Methods Skilled observation;Therapeutic exercise;Compensation strategy training;Upgraded PO texture trial;Patient/caregiver education   Patient observed directly with PO's Yes   Type of PO's observed Thin liquids   Feeding Needs assist;Able to feed self   Liquids provided via Cup   Oral Phase Signs & Symptoms Anterior loss/spillage   Pharyngeal Phase Signs & Symptoms Delayed cough  suspect secretions vs bolus   Type of cueing Verbal;Tactile;Visual   Amount of cueing Moderate   Other treatment/comments ST pt's first appointment this morning; caregiver reported oral care performed immediately prior to arrival. Pt able to grasp cup and retrieve sips of water with min SLP assistance. Pt with consecutive swallows of thin water with appearance of timely swallow response, no anterior oral spillage and no overt signs of aspiration. She requires rare min A for lip closure and subsequent dry swallow, and completes additional dry swallows as needed throughout trials. She consumed 8 oz water with no overt signs of  aspiration. Single delayed cough noted x1 which appeared to be related to secretion management vs reduced airway protection with oral intake.      Pain Assessment   Pain Assessment No/denies pain     Cognitive-Linquistic Treatment   Treatment focused on Voice;Dysarthria;Patient/family/caregiver education   Skilled Treatment Pt achieved functional voicing speaking one word at a time during phrase level activity of 4-5 words with increased cognitive load. Required frequent min A demo, visual and verbal cues, for breath support, vocal amplitude, dry swallow prior to speech and overarticulation.     Tracheostomy Shiley 4 mm Uncuffed    Tracheostomy Properties Placement Date: 05/08/16 Placement Time: 1030 Placed By: ICU physician Brand: Shiley Size (mm): 4 mm Style: Uncuffed   Status Capped   Site Assessment Clean;Dry   Ties Assessment Clean;Dry;Secure     Assessment / Recommendations / Plan   Plan Continue with current plan of care     Dysphagia Recommendations   Diet recommendations Other(comment)  Free water protocol   Liquids provided via Teaspoon;Cup   Medication Administration Via alternative means   Supervision Full supervision/cueing for compensatory strategies  Assist with self-feeding   Compensations Effortful swallow;Multiple dry swallows after each bite/sip;Check for anterior loss;Small sips/bites;Slow rate   Postural Changes and/or Swallow Maneuvers Seated upright 90 degrees     Progression Toward Goals   Progression toward goals Progressing toward goals          SLP Education - 05/31/16 1725    Education provided Yes   Education Details pt self feeding improves sensory feedback   Person(s) Educated Patient;Spouse;Caregiver(s)   Methods Explanation;Demonstration;Verbal cues   Comprehension Verbalized understanding          SLP Short Term Goals - 05/31/16 1721      SLP SHORT TERM GOAL #1   Title Patient will utilize intelligibility strategies with moderate visual cues from communication partner/visual aid to communicate 5-7 word wants, needs, thoughts and ideas.   Baseline 03/15/16, 04/05/16, 04/12/16, 04/19/16, 05/27/16   Time 1   Period Weeks   Status Achieved     SLP SHORT TERM GOAL #2   Title Patient will utilize intelligibility strategies, breath support with mod A for phrase repetition in 75% of trials.   Baseline 2.12.18, 04/05/16, 04/08/16, 04/12/16   Time 1   Period Weeks   Status Achieved     SLP SHORT TERM GOAL #3   Title Pt will swallow thin liquids, purees and soft solids in 9/10 trials with no overt signs of aspiration.   Time 4   Period Weeks   Status Achieved     SLP SHORT  TERM GOAL #4   Title Patient will tolerate 6oz water, >95% with no overt signs of aspiration.   Time 1   Period Weeks   Status Achieved     SLP SHORT TERM GOAL #5   Title Patient will utilize verbal cues for lip closure, dry swallow for secretion management in 75% of opportunities.   Baseline 80% mod cues 4/30 ; 5/3-  80% min-mod cues   Time 1   Period Weeks   Status Achieved          SLP Long Term Goals - 05/31/16 1722      SLP LONG TERM GOAL #1   Time 2   Period Weeks   Status Deferred     SLP LONG TERM GOAL #2   Title Patient will utilize intelligibility strategies, breath support with min A visual cues for 5-7 word  responses with 80% accuracy.   Time 4   Period Weeks   Status On-going     SLP LONG TERM GOAL #3   Title Patient will demonstrate readiness for MBS to determine safety for pleasure feeds by timely initiation of swallow in 3/5 trials with ice chips/H20.   Baseline 2.12.18   Time 2   Period Weeks   Status Achieved     SLP LONG TERM GOAL #4   Title Patient will adequately masticate and clear soft solids with mod verbal cues and no overt signs of aspiration in 4/5 trials.   Baseline 04/19/16: chip reports whole bowl of pureed soup   Time 5   Period Weeks   Status Deferred     SLP LONG TERM GOAL #5   Title Patient will utilize visual cue for lip closure, dry swallow for secretion management in 75% of opportunities.   Baseline 5.7.18   Time 4   Period Weeks   Status On-going          Plan - 05/31/16 1721    Clinical Impression Statement Patient is alert and attentive during first 38 minutes of session, though presented with fatigue as session concluded and required mod verbal and tactile cues. Pt continues to demonstrate improvement in swallow function and today appears close to baseline prior to her most recent hospitalization. She is now able to self feed cup sips of water with min A, which appears to improve automaticity of swallow response and lip  closure. Recommended continuing with water protocol at this time; will assess solids next session. Continues to have reduced breath support impacting speech intelligibility, however reduced need for cuing today. Recommend cont' d skilled ST in order to maximize functional communication, swallowing safety, reduce caregiver burden and improve quality of life.   Speech Therapy Frequency 2x / week   Treatment/Interventions Aspiration precaution training;Trials of upgraded texture/liquids;Compensatory strategies;Functional tasks;Patient/family education;Cueing hierarchy;Diet toleration management by SLP;Cognitive reorganization;Compensatory techniques;SLP instruction and feedback;Internal/external aids;Multimodal communcation approach   Potential to Achieve Goals Good   Potential Considerations Ability to learn/carryover information;Co-morbidities;Severity of impairments   SLP Home Exercise Plan free water protocol only PO at this time, continue functional phrases   Consulted and Agree with Plan of Care Family member/caregiver;Patient   Family Member Consulted husband chip, caregiver lisa      Patient will benefit from skilled therapeutic intervention in order to improve the following deficits and impairments:   Dysphagia, oropharyngeal phase  Dysarthria and anarthria  Cognitive communication deficit    Problem List Patient Active Problem List   Diagnosis Date Noted  . Heterotopic ossification of bone 03/10/2016  . Acute on chronic respiratory failure with hypoxia ([place])   . Acute hypoxemic respiratory failure ([place])   . Acute blood loss anemia   . Atelectasis 02/04/2016  . Debility 02/03/2016  . Pneumonia of both lower lobes due to Pseudomonas species ([place])   . Pneumonia of both lower lobes due to methicillin susceptible Staphylococcus aureus (MSSA) ([place])   . Acute respiratory failure with hypoxia ([place])   . Acute encephalopathy   . Seizures ([place])   . Sepsis ([place])   . Hypoxia   . Pain    . HCAP (healthcare-associated pneumonia) 12/28/2015  . Chronic respiratory failure ([place]) 12/28/2015  . Acute tracheobronchitis 12/24/2015  . Tracheostomy tube present ([place])   . Tracheal stenosis   . Chronic respiratory failure with hypoxia ([place])   . 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 Tracheostomy status ([place]) 09/26/2015  . Allergic rhinitis  09/26/2015  . 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 Movement disorder 09/22/2015  . Encephalitis 09/22/2015  . Chest pain 02/07/2014  . Premature atrial contractions 01/13/2014  . Abnormality of gait 12/04/2013  . Hyperlipidemia 12/21/2011  . Cardiovascular risk factor 12/21/2011  . Bunion 01/31/2008  . Metatarsalgia of both feet 09/20/2007  . FLAT FOOT 09/20/2007  . HALLUX RIGIDUS, ACQUIRED 09/20/2007   Deneise Lever, [place], [place] 05/31/2016, 5:31 PM  [facility] [address] [place], [place], [address] Phone: [phone]   Fax:  [phone]   Name: JANEA SCHWENN MRN: [id number] Date of Birth: 1950/08/17

## 2016-06-02 ENCOUNTER — Encounter: Payer: Self-pay | Admitting: Occupational Therapy

## 2016-06-02 ENCOUNTER — Ambulatory Visit: Payer: Medicare Other | Admitting: Occupational Therapy

## 2016-06-02 ENCOUNTER — Other Ambulatory Visit: Payer: Self-pay | Admitting: Physical Medicine & Rehabilitation

## 2016-06-02 DIAGNOSIS — M6281 Muscle weakness (generalized): Secondary | ICD-10-CM

## 2016-06-02 DIAGNOSIS — R41844 Frontal lobe and executive function deficit: Secondary | ICD-10-CM

## 2016-06-02 DIAGNOSIS — R2681 Unsteadiness on feet: Secondary | ICD-10-CM

## 2016-06-02 DIAGNOSIS — R29818 Other symptoms and signs involving the nervous system: Secondary | ICD-10-CM

## 2016-06-02 DIAGNOSIS — R2689 Other abnormalities of gait and mobility: Secondary | ICD-10-CM | POA: Diagnosis not present

## 2016-06-02 DIAGNOSIS — M25622 Stiffness of left elbow, not elsewhere classified: Secondary | ICD-10-CM

## 2016-06-02 DIAGNOSIS — M25612 Stiffness of left shoulder, not elsewhere classified: Secondary | ICD-10-CM

## 2016-06-02 DIAGNOSIS — M25611 Stiffness of right shoulder, not elsewhere classified: Secondary | ICD-10-CM

## 2016-06-02 DIAGNOSIS — R293 Abnormal posture: Secondary | ICD-10-CM

## 2016-06-02 NOTE — Therapy (Signed)
[facility] [address] [place], [place], [address] Phone: [phone]   Fax:  [phone]  Occupational Therapy Treatment  Patient Details  Name: Judy Spears MRN: [id number] Date of Birth: 1950/03/15 Referring Provider: Dr. Quita Skye. [name]  Encounter Date: 06/02/2016      OT End of Session - 06/02/16 1652    Visit Number 23   Number of Visits 32   Date for OT Re-Evaluation 07/08/16   Authorization Type [employer] unlimited visits   OT Start Time 1401   OT Stop Time 1444   OT Time Calculation (min) 43 min   Activity Tolerance Patient tolerated treatment well      Past Medical History:  Diagnosis Date  . Arthritis    lt great toe  . Complication of anesthesia    pt has had headaches post op-did advise to hydra well preop  . Hair loss    right-sided  . History of exercise stress test    a. ETT (12/15) with 12:00 exercise, no ST changes, occasional PACs.   . Hyperlipidemia   . Insomnia   . Migraine headache   . Movement disorder    resltess in left legs  . Postmenopausal   . Premature atrial contractions    a. Holter (12/15) with 8% PACs, no atrial runs or atrial fibrillation.     Past Surgical History:  Procedure Laterality Date  . BREAST BIOPSY  01/01/2011   Procedure: BREAST BIOPSY WITH NEEDLE LOCALIZATION;  Surgeon: Rolm Bookbinder, MD;  Location: [facility];  Service: General;  Laterality: Left;  left breast wire localization biopsy  . cataracts right eye    . CESAREAN SECTION  1986  . HERNIA REPAIR  2000   RIH  . opirectinal membrane peel    . RHINOPLASTY  1983    There were no vitals filed for this visit.      Subjective Assessment - 06/02/16 1409    Patient is accompained by: Family member  leesa caregiver   Patient Stated Goals Pt unable to state.    Currently in Pain? No/denies                      OT Treatments/Exercises (OP) - 06/02/16 0001       Neurological Re-education Exercises   Other Exercises 2 Neuro re ed to address sit to stand, static standing balance with alignment with pt standing with back against the wall to begin to actively experience mid Spears as well as work on pt's ability to shift her weight back over her feet.  Today pt was able to stand at wall with close supervision after prep.  While working at wall, addressed pt's ability to begin to use 1 UE for functional task and maintain alignment/balance - pt able to do so with min a for use RUE functionally. Transitioned to standing at counter with BUE's in light contact with counter and pt was able to maintain balance with close supervision for 2 minutes.  Pt requires significant repetition for motor learning and for postural orientation and alignment but is slowly improving.                   OT Short Term Goals - 06/02/16 1650      OT SHORT TERM GOAL #1   Title Pt, husband and cargiver will be mod I with HEP - 03/29/2016   Status Achieved     OT SHORT TERM GOAL #2  Title Pt will be able to sit with supervision without UE support and vc's to assist with midline 75% while engage in functional task   Status Achieved     OT SHORT TERM GOAL #3   Title Pt will be able to complete sit to stand consistently with mod A for toilet transfers (max a for pivot)   Status Achieved     OT SHORT TERM GOAL #4   Title Pt, husband and caregiver will verbalize understanding of various bathroom accomodations to reduce long term caregiver burden   Status Achieved     OT SHORT TERM GOAL #5   Title Pt will be able to stand step transfer to toilet with mo more than mod a, mod vc's consistently using grab bar for community toileting needs- 06/10/2016 date adjusted as pt missed 2 weeks due to hospitalization   Status On-going     OT SHORT TERM GOAL #6   Title Pt will be able to wash face with supervision/max cues 50% of the time, min a 50% of the time.    Status On-going     OT  SHORT TERM GOAL #7   Title Pt will be able to don pull over shirt with mod a, max cues   Status On-going     OT SHORT TERM GOAL #8   Title Pt will be able to maintain static standing with UE support with close supervision to reduce burden of care for caregivers    Status On-going           OT Long Term Goals - 06/02/16 1650      OT LONG TERM GOAL #1   Title Pt, husband and caregiver will be mod I with upgraded HEP prn - 04/26/2016   Status Achieved     OT LONG TERM GOAL #2   Title Pt, husband and caregiver will be mod I with splint wear and care    Status Achieved     OT LONG TERM GOAL #3   Title Pt will be able to wash face with min a only   Status Achieved     OT LONG TERM GOAL #4   Title Pt will be able to sit without UE support with supevision 100% of the time with no more than 2 vc's while engaged in functional activity.   Status Achieved     OT LONG TERM GOAL #5   Title Pt, husband and caregiver will verbalize understanding of potential after care therapeutic opportunities. - 07/08/2016 date adjusted as pt missed 2 weeks due to hospitalization   Status On-going     OT LONG TERM GOAL #6   Title Pt will be supervision to wash face consistently   Status On-going     OT LONG TERM GOAL #7   Title Pt will don shirt with min a and max vc's 50% of the time   Status On-going     OT LONG TERM GOAL #8   Title Pt will require min a for stand step transfers to toilet using grab bar for community toileting at least 75% of the time.   Status On-going     OT LONG TERM GOAL  #9   Baseline Pt will stand with contact gurard with only 1 UE support for static standing balance in prep for abiilty to engage in functional task while standing.    Status On-going               Plan - 06/02/16 1650  Clinical Impression Statement Pt continues to make slow but steady progress with balance and finding midline.  Discussed with caregiver using wall to practice standing at home.  Caregiver in agreement.    Rehab Potential Fair   Clinical Impairments Affecting Rehab Potential severity of deficits, very slow rate of recovery   OT Frequency 2x / week   OT Duration 8 weeks   OT Treatment/Interventions Self-care/ADL training;Ultrasound;Moist Heat;Electrical Stimulation;DME and/or AE instruction;Neuromuscular education;Therapeutic exercise;Functional Mobility Training;Manual Therapy;Passive range of motion;Splinting;Therapeutic activities;Balance training;Patient/family education;Visual/perceptual remediation/compensation;Cognitive remediation/compensation   Plan NMR for functional use of UE's, static to dynamic standing balance, postural control and orientation, trunk control and functional mobility   Consulted and Agree with Plan of Care Patient;Family member/caregiver   Family Member Consulted caregiver Leesa      Patient will benefit from skilled therapeutic intervention in order to improve the following deficits and impairments:  Decreased endurance, Decreased skin integrity, Impaired vision/preception, Improper body mechanics, Impaired perceived functional ability, Decreased knowledge of precautions, Cardiopulmonary status limiting activity, Decreased activity tolerance, Decreased knowledge of use of DME, Decreased strength, Impaired flexibility, Improper spinal/pelvic alignment, Impaired sensation, Difficulty walking, Decreased mobility, Decreased balance, Decreased cognition, Decreased range of motion, Impaired tone, Impaired UE functional use, Decreased safety awareness, Decreased coordination, Pain  Visit Diagnosis: Unsteadiness on feet  Abnormal posture  Muscle weakness (generalized)  Other symptoms and signs involving the nervous system  Stiffness of right shoulder, not elsewhere classified  Stiffness of left shoulder, not elsewhere classified  Stiffness of left elbow, not elsewhere classified  Frontal lobe and executive function deficit    Problem  List Patient Active Problem List   Diagnosis Date Noted  . Heterotopic ossification of bone 03/10/2016  . Acute on chronic respiratory failure with hypoxia ([place])   . Acute hypoxemic respiratory failure ([place])   . Acute blood loss anemia   . Atelectasis 02/04/2016  . Debility 02/03/2016  . Pneumonia of both lower lobes due to Pseudomonas species ([place])   . Pneumonia of both lower lobes due to methicillin susceptible Staphylococcus aureus (MSSA) ([place])   . Acute respiratory failure with hypoxia ([place])   . Acute encephalopathy   . Seizures ([place])   . Sepsis ([place])   . Hypoxia   . Pain   . HCAP (healthcare-associated pneumonia) 12/28/2015  . Chronic respiratory failure ([place]) 12/28/2015  . Acute tracheobronchitis 12/24/2015  . Tracheostomy tube present ([place])   . Tracheal stenosis   . Chronic respiratory failure with hypoxia ([place])   . 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 Tracheostomy status ([place]) 09/26/2015  . Allergic rhinitis 09/26/2015  . 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 Movement disorder 09/22/2015  . Encephalitis 09/22/2015  . Chest pain 02/07/2014  . Premature atrial contractions 01/13/2014  . Abnormality of gait 12/04/2013  . Hyperlipidemia 12/21/2011  . Cardiovascular risk factor 12/21/2011  . Bunion 01/31/2008  . Metatarsalgia of both feet 09/20/2007  . FLAT FOOT 09/20/2007  . HALLUX RIGIDUS, ACQUIRED 09/20/2007    Quay Burow, OTR/L 06/02/2016, 4:54 PM  [facility] [address] [place], [place], [address] Phone: [phone]   Fax:  [phone]  Name: TAYDEN DURAN MRN: [id number] Date of Birth: 03-01-1950

## 2016-06-03 ENCOUNTER — Ambulatory Visit: Payer: Medicare Other | Admitting: Rehabilitation

## 2016-06-03 ENCOUNTER — Encounter: Payer: Self-pay | Admitting: Rehabilitation

## 2016-06-03 ENCOUNTER — Encounter: Payer: Self-pay | Admitting: Occupational Therapy

## 2016-06-03 ENCOUNTER — Ambulatory Visit: Payer: Medicare Other | Admitting: Occupational Therapy

## 2016-06-03 DIAGNOSIS — R293 Abnormal posture: Secondary | ICD-10-CM

## 2016-06-03 DIAGNOSIS — M6281 Muscle weakness (generalized): Secondary | ICD-10-CM

## 2016-06-03 DIAGNOSIS — R2689 Other abnormalities of gait and mobility: Secondary | ICD-10-CM

## 2016-06-03 DIAGNOSIS — R29818 Other symptoms and signs involving the nervous system: Secondary | ICD-10-CM

## 2016-06-03 DIAGNOSIS — R2681 Unsteadiness on feet: Secondary | ICD-10-CM

## 2016-06-03 DIAGNOSIS — M25622 Stiffness of left elbow, not elsewhere classified: Secondary | ICD-10-CM

## 2016-06-03 DIAGNOSIS — M25612 Stiffness of left shoulder, not elsewhere classified: Secondary | ICD-10-CM

## 2016-06-03 DIAGNOSIS — R41844 Frontal lobe and executive function deficit: Secondary | ICD-10-CM

## 2016-06-03 DIAGNOSIS — M25611 Stiffness of right shoulder, not elsewhere classified: Secondary | ICD-10-CM

## 2016-06-03 NOTE — Therapy (Signed)
[facility] [address] [place], [place], [address] Phone: [phone]   Fax:  [phone]  Occupational Therapy Treatment  Patient Details  Name: Judy Spears MRN: [id number] Date of Birth: 1950-09-23 Referring Provider: Dr. Quita Skye. [name]  Encounter Date: 06/03/2016      OT End of Session - 06/03/16 1217    Visit Number 24   Number of Visits 32   Date for OT Re-Evaluation 07/08/16   Authorization Type BCBS unlimited visits   OT Start Time 1102   OT Stop Time 1147   OT Time Calculation (min) 45 min   Activity Tolerance Patient tolerated treatment well      Past Medical History:  Diagnosis Date  . Arthritis    lt great toe  . Complication of anesthesia    pt has had headaches post op-did advise to hydra well preop  . Hair loss    right-sided  . History of exercise stress test    a. ETT (12/15) with 12:00 exercise, no ST changes, occasional PACs.   . Hyperlipidemia   . Insomnia   . Migraine headache   . Movement disorder    resltess in left legs  . Postmenopausal   . Premature atrial contractions    a. Holter (12/15) with 8% PACs, no atrial runs or atrial fibrillation.     Past Surgical History:  Procedure Laterality Date  . BREAST BIOPSY  01/01/2011   Procedure: BREAST BIOPSY WITH NEEDLE LOCALIZATION;  Surgeon: Rolm Bookbinder, MD;  Location: [facility];  Service: General;  Laterality: Left;  left breast wire localization biopsy  . cataracts right eye    . CESAREAN SECTION  1986  . HERNIA REPAIR  2000   RIH  . opirectinal membrane peel    . RHINOPLASTY  1983    There were no vitals filed for this visit.      Subjective Assessment - 06/03/16 1100    Subjective  I am tired (at end of session)   Patient is accompained by: Family member  husband [name] and caregiver [name]   Pertinent History see epic snapshot - ABI/encephalitis/hypoxia from virus; recent hospitalization due  to ARF;    Patient Stated Goals Pt unable to state.    Currently in Pain? No/denies                      OT Treatments/Exercises (OP) - 06/03/16 0001      Neurological Re-education Exercises   Other Exercises 2 Neuro re ed to address sit to stand, static and dynamic standing balance, postural alignment and postural control in standing and side stepping with UE support.  Pt able to stand with UE support and close supervision after signifcant prep work for brief periods of time.  Also addressed trunk rotation and decreasing abnormal posturing through head and R shoulder with reaching activities. Also addressed active stand to sit - pt needs  mod facilitation .                  OT Short Term Goals - 06/03/16 1215      OT SHORT TERM GOAL #1   Title Pt, husband and cargiver will be mod I with HEP - 03/29/2016   Status Achieved     OT SHORT TERM GOAL #2   Title Pt will be able to sit with supervision without UE support and vc's to assist with midline 75% while engage in functional task  Status Achieved     OT SHORT TERM GOAL #3   Title Pt will be able to complete sit to stand consistently with mod A for toilet transfers (max a for pivot)   Status Achieved     OT SHORT TERM GOAL #4   Title Pt, husband and caregiver will verbalize understanding of various bathroom accomodations to reduce long term caregiver burden   Status Achieved     OT SHORT TERM GOAL #5   Title Pt will be able to stand step transfer to toilet with mo more than mod a, mod vc's consistently using grab bar for community toileting needs- 06/10/2016 date adjusted as pt missed 2 weeks due to hospitalization   Status On-going     OT SHORT TERM GOAL #6   Title Pt will be able to wash face with supervision/max cues 50% of the time, min a 50% of the time.    Status On-going     OT SHORT TERM GOAL #7   Title Pt will be able to don pull over shirt with mod a, max cues   Status On-going     OT SHORT  TERM GOAL #8   Title Pt will be able to maintain static standing with UE support with close supervision to reduce burden of care for caregivers    Status On-going           OT Long Term Goals - 06/03/16 1215      OT LONG TERM GOAL #1   Title Pt, husband and caregiver will be mod I with upgraded HEP prn - 04/26/2016   Status Achieved     OT LONG TERM GOAL #2   Title Pt, husband and caregiver will be mod I with splint wear and care    Status Achieved     OT LONG TERM GOAL #3   Title Pt will be able to wash face with min a only   Status Achieved     OT LONG TERM GOAL #4   Title Pt will be able to sit without UE support with supevision 100% of the time with no more than 2 vc's while engaged in functional activity.   Status Achieved     OT LONG TERM GOAL #5   Title Pt, husband and caregiver will verbalize understanding of potential after care therapeutic opportunities. - 07/08/2016 date adjusted as pt missed 2 weeks due to hospitalization   Status On-going     OT LONG TERM GOAL #6   Title Pt will be supervision to wash face consistently   Status On-going     OT LONG TERM GOAL #7   Title Pt will don shirt with min a and max vc's 50% of the time   Status On-going     OT LONG TERM GOAL #8   Title Pt will require min a for stand step transfers to toilet using grab bar for community toileting at least 75% of the time.   Status On-going     OT LONG TERM GOAL  #9   Baseline Pt will stand with contact gurard with only 1 UE support for static standing balance in prep for abiilty to engage in functional task while standing.    Status On-going               Plan - 06/03/16 1215    Clinical Impression Statement Pt continues with slow but steady progress toward goals. Pt improving in basic functional mobility.    Rehab Potential Fair  Clinical Impairments Affecting Rehab Potential severity of deficits, very slow rate of recovery   OT Frequency 2x / week   OT Duration 8 weeks    OT Treatment/Interventions Self-care/ADL training;Ultrasound;Moist Heat;Electrical Stimulation;DME and/or AE instruction;Neuromuscular education;Therapeutic exercise;Functional Mobility Training;Manual Therapy;Passive range of motion;Splinting;Therapeutic activities;Balance training;Patient/family education;Visual/perceptual remediation/compensation;Cognitive remediation/compensation   Plan NMR for functional use of UE's, static to dynamic standing balance, postural control and orientation,trunk control and functional mobility   Consulted and Agree with Plan of Care Patient;Family member/caregiver   Family Member Consulted caregiver Leesa husband [name]      Patient will benefit from skilled therapeutic intervention in order to improve the following deficits and impairments:  Decreased endurance, Decreased skin integrity, Impaired vision/preception, Improper body mechanics, Impaired perceived functional ability, Decreased knowledge of precautions, Cardiopulmonary status limiting activity, Decreased activity tolerance, Decreased knowledge of use of DME, Decreased strength, Impaired flexibility, Improper spinal/pelvic alignment, Impaired sensation, Difficulty walking, Decreased mobility, Decreased balance, Decreased cognition, Decreased range of motion, Impaired tone, Impaired UE functional use, Decreased safety awareness, Decreased coordination, Pain  Visit Diagnosis: Unsteadiness on feet  Abnormal posture  Muscle weakness (generalized)  Other symptoms and signs involving the nervous system  Stiffness of right shoulder, not elsewhere classified  Stiffness of left shoulder, not elsewhere classified  Stiffness of left elbow, not elsewhere classified  Frontal lobe and executive function deficit    Problem List Patient Active Problem List   Diagnosis Date Noted  . Heterotopic ossification of bone 03/10/2016  . Acute on chronic respiratory failure with hypoxia ([place])   . Acute hypoxemic  respiratory failure ([place])   . Acute blood loss anemia   . Atelectasis 02/04/2016  . Debility 02/03/2016  . Pneumonia of both lower lobes due to Pseudomonas species ([place])   . Pneumonia of both lower lobes due to methicillin susceptible Staphylococcus aureus (MSSA) ([place])   . Acute respiratory failure with hypoxia ([place])   . Acute encephalopathy   . Seizures ([place])   . Sepsis ([place])   . Hypoxia   . Pain   . HCAP (healthcare-associated pneumonia) 12/28/2015  . Chronic respiratory failure ([place]) 12/28/2015  . Acute tracheobronchitis 12/24/2015  . Tracheostomy tube present ([place])   . Tracheal stenosis   . Chronic respiratory failure with hypoxia ([place])   . 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[place]) 10/20/2015  . Tracheostomy status ([place]) 09/26/2015  . Allergic rhinitis 09/26/2015  . 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 Movement disorder 09/22/2015  . Encephalitis 09/22/2015  . Chest pain 02/07/2014  . Premature atrial contractions 01/13/2014  . Abnormality of gait 12/04/2013  . Hyperlipidemia 12/21/2011  . Cardiovascular risk factor 12/21/2011  . Bunion 01/31/2008  . Metatarsalgia of both feet 09/20/2007  . FLAT FOOT 09/20/2007  . HALLUX RIGIDUS, ACQUIRED 09/20/2007    [facility] 06/03/2016, 12:18 PM  [facility] [address] [place], [place], [address] Phone: [phone]   Fax:  [phone]  Name: Judy Spears MRN: [id number] Date of Birth: [birth year]

## 2016-06-03 NOTE — Therapy (Signed)
[facility] [address] [place], [place], [address] Phone: [phone]   Fax:  [phone]  Physical Therapy Treatment  Patient Details  Name: Judy Spears MRN: [id number] Date of Birth: 1950/10/07 Referring Provider: Alger Simons, MD/Andrew Letta Pate, MD  Encounter Date: 06/03/2016      PT End of Session - 06/03/16 1242    Visit Number 25   Number of Visits 36   Date for PT Re-Evaluation 06/29/16   Authorization Type BCBS   PT Start Time 1017   PT Stop Time 1100   PT Time Calculation (min) 43 min   Activity Tolerance Patient tolerated treatment well   Behavior During Therapy [facility] for tasks assessed/performed      Past Medical History:  Diagnosis Date  . Arthritis    lt great toe  . Complication of anesthesia    pt has had headaches post op-did advise to hydra well preop  . Hair loss    right-sided  . History of exercise stress test    a. ETT (12/15) with 12:00 exercise, no ST changes, occasional PACs.   . Hyperlipidemia   . Insomnia   . Migraine headache   . Movement disorder    resltess in left legs  . Postmenopausal   . Premature atrial contractions    a. Holter (12/15) with 8% PACs, no atrial runs or atrial fibrillation.     Past Surgical History:  Procedure Laterality Date  . BREAST BIOPSY  01/01/2011   Procedure: BREAST BIOPSY WITH NEEDLE LOCALIZATION;  Surgeon: Rolm Bookbinder, MD;  Location: [facility];  Service: General;  Laterality: Left;  left breast wire localization biopsy  . cataracts right eye    . CESAREAN SECTION  1986  . HERNIA REPAIR  2000   RIH  . opirectinal membrane peel    . RHINOPLASTY  1983    There were no vitals filed for this visit.      Subjective Assessment - 06/03/16 1234    Subjective Pt/caregiver reports going outside once to twice a day.    Patient is accompained by: Family member   Pertinent History Precautions:  PEG.  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  Another round of botox in LLE on 02/06/16   Limitations House hold activities;Walking;Standing;Sitting   Patient Stated Goals Per husband, "For [name] to be as independent as possible."   Currently in Pain? No/denies                         [facility] Adult PT Treatment/Exercise - 06/03/16 0001      Transfers   Transfers Sit to Stand   Sit to Stand 4: Min assist;3: Mod assist   Sit to Stand Details Tactile cues for placement;Verbal cues for technique;Manual facilitation for weight shifting;Verbal cues for gait pattern   Sit to Stand Details (indicate cue type and reason) Varying amounts of assist needed today for forward weight shift and trunk lean as she still tends to push posteriorly esp as soon as LEs are loaded.    Stand to Sit 4: Min assist   Stand to Sit Details (indicate cue type and reason) Verbal cues for technique;Tactile cues for placement   Stand to Sit Details Cues and facilitation for forward trunk lean.  Neuro Re-ed    Neuro Re-ed Details  NMR in // bars today for static standing balance with emphasis on finding midline (increasing weight shift to the L with hips and to the R with shoulders/head).  Following midline posture had pt work on ambulation forward/backward with BUE.  Pt able to take several steps at close S level with cues for weight shift and postural control.  Continue to note with posterior stepping, she tends to increase forward trunk lean and weight shift.  Then moved to having pt reach across body for target, find midline and then place in front of her.  Performed x R and L x 5 reps.  Mod a for facilitation.                   PT Short Term Goals - 05/28/16 1522      PT SHORT TERM GOAL #1   Title Pt will consistently perform supine  rolling in B directions at S level (with rails as needed) in order to indicate improved trunk dissociation and increaed independence with bed mobility.  (Target Date for STGs: 05/28/16)   Baseline min assist needed per husband's report - 05-24-16   Time 4   Period Weeks   Status On-going     PT SHORT TERM GOAL #2   Title Pt will consistently perform R SL>sit at mod A level in order to indicate increased independence with getting out of bed.     Baseline inconsistently met per husband's report - 05-27-16   Time 4   Period Weeks   Status On-going     PT SHORT TERM GOAL #3   Title Pt will consistently perform L SL>sit at min A level in order to indicate increased independene with getting out of bed/off of exercise mat table.    Baseline mod to min assist needed - 05-27-16   Status Partially Met     PT SHORT TERM GOAL #4   Title Pt will consistently perform sit to SL R and L at mod A level in order to indicate improved independence with bed mobility.     Baseline met 05-28-16   Status Achieved     PT SHORT TERM GOAL #5   Title Pt will perform transfers from w/c to seated surface at min/guard level in order to indicate improved independence with transfers.    Baseline mod assist needed - 05-24-16   Status On-going     PT SHORT TERM GOAL #6   Title Pt will perform static standing with BUE support x 3 mins at S level with no overt LOB to decrease burden of care and increase independence with ADLs.    Baseline Pt able to stand for 3" but amount of assist varies - supervision to mod assist depending on fatigue - 05-28-16   Time 4   Period Weeks   Status On-going     PT SHORT TERM GOAL #7   Title Pt will perform dynamic sitting at [facility] with intermittent UE support, reaching outside of BOS x 5 mins in order to indicate safety with ADLs.    Baseline met 05-24-16   Status Achieved           PT Long Term Goals - 05/01/16 0953      PT LONG TERM GOAL #1   Title Pt will perform supine rolling R and  L at mod I level in order to indicate improved independence with bed mobility.  (Target Date: 06/25/16)   Time  8   Period Weeks   Status New     PT LONG TERM GOAL #2   Title Pt will perform sit>SL R or L at min A level in order to indicate improved independence with getting into bed.    Time 8   Period Weeks   Status New     PT LONG TERM GOAL #3   Title Pt will perform SL R to sit at min A level, SL L to sit at S level in order to indicate increased independence with getting out of bed.     Time 8   Period Weeks   Status New     PT LONG TERM GOAL #4   Title Pt will perform transfers at S level 50% of the time in order to indicate improved functional independence.    Time 8   Period Weeks   Status New     PT LONG TERM GOAL #5   Title Pt will be able to stand with single UE support while performing opposite UE task x 3 mins at S level in order to indicate improved independence with ADLs.    Time 8   Period Weeks   Status New     Additional Long Term Goals   Additional Long Term Goals Yes     PT LONG TERM GOAL #6   Title Pt will demonstrate ability to ambulate up to 25' w/ LRAD at min A level in order to assist with getting around home.     Time 8   Period Weeks   Status New               Plan - 06/03/16 1243    Clinical Impression Statement Skilled session continues to focus on NMR in standing in // bars for improved static and dynamic standing and postural control.  Pt with increased forward trunk lean and weight shift today in standing vs previous session.     Rehab Potential Fair   Clinical Impairments Affecting Rehab Potential Chronicity of condition, continued medical management, but has very strong family and financial support   PT Frequency 2x / week   PT Duration 8 weeks   PT Treatment/Interventions ADLs/Self Care Home Management;DME Instruction;Gait training;Stair training;Functional mobility training;Therapeutic activities;Patient/family education;Cognitive  remediation;Neuromuscular re-education;Therapeutic exercise;Balance training;Orthotic Fit/Training;Wheelchair mobility training;Manual techniques;Splinting;Visual/perceptual remediation/compensation;Vestibular;Passive range of motion;Electrical Stimulation   PT Next Visit Plan gait training; standing balance and transfers, L trunk lengthening, active trunk movement   Consulted and Agree with Plan of Care Family member/caregiver   Family Member Consulted caregiver, [name] and husband [name]      Patient will benefit from skilled therapeutic intervention in order to improve the following deficits and impairments:  Abnormal gait, Decreased balance, Decreased endurance, Decreased cognition, Cardiopulmonary status limiting activity, Decreased activity tolerance, Decreased coordination, Decreased strength, Impaired flexibility, Impaired tone, Decreased mobility, Increased muscle spasms, Impaired vision/preception, Decreased range of motion, Impaired UE functional use, Postural dysfunction, Improper body mechanics, Impaired perceived functional ability, Decreased knowledge of precautions, Decreased knowledge of use of DME  Visit Diagnosis: Unsteadiness on feet  Abnormal posture  Muscle weakness (generalized)  Other abnormalities of gait and mobility     Problem List Patient Active Problem List   Diagnosis Date Noted  . Heterotopic ossification of bone 03/10/2016  . Acute on chronic respiratory failure with hypoxia ([place])   . Acute hypoxemic respiratory failure ([place])   . Acute blood loss anemia   . Atelectasis 02/04/2016  . Debility 02/03/2016  . Pneumonia of  both lower lobes due to Pseudomonas species ([place])   . Pneumonia of both lower lobes due to methicillin susceptible Staphylococcus aureus (MSSA) ([place])   . Acute respiratory failure with hypoxia ([place])   . Acute encephalopathy   . Seizures ([place])   . Sepsis ([place])   . Hypoxia   . Pain   . HCAP (healthcare-associated pneumonia) 12/28/2015  .  Chronic respiratory failure ([place]) 12/28/2015  . Acute tracheobronchitis 12/24/2015  . Tracheostomy tube present ([place])   . Tracheal stenosis   . Chronic respiratory failure with hypoxia ([place])   . 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[place]) 10/20/2015  . Tracheostomy status ([place]) 09/26/2015  . Allergic rhinitis 09/26/2015  . 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 Movement disorder 09/22/2015  . Encephalitis 09/22/2015  . Chest pain 02/07/2014  . Premature atrial contractions 01/13/2014  . Abnormality of gait 12/04/2013  . Hyperlipidemia 12/21/2011  . Cardiovascular risk factor 12/21/2011  . Bunion 01/31/2008  . Metatarsalgia of both feet 09/20/2007  . FLAT FOOT 09/20/2007  . HALLUX RIGIDUS, ACQUIRED 09/20/2007    Denice Bors 06/03/2016, 12:45 PM  [facility] [address] [place], [place], [address] Phone: [phone]   Fax:  [phone]  Name: Judy Spears MRN: [id number] Date of Birth: 1950-02-01

## 2016-06-07 ENCOUNTER — Encounter: Payer: Self-pay | Admitting: Occupational Therapy

## 2016-06-07 ENCOUNTER — Other Ambulatory Visit: Payer: Self-pay | Admitting: Physical Medicine & Rehabilitation

## 2016-06-07 ENCOUNTER — Ambulatory Visit: Payer: Medicare Other | Admitting: Occupational Therapy

## 2016-06-07 ENCOUNTER — Ambulatory Visit: Payer: Medicare Other | Admitting: Physical Therapy

## 2016-06-07 ENCOUNTER — Ambulatory Visit: Payer: Medicare Other | Admitting: Speech Pathology

## 2016-06-07 DIAGNOSIS — R1312 Dysphagia, oropharyngeal phase: Secondary | ICD-10-CM

## 2016-06-07 DIAGNOSIS — R2689 Other abnormalities of gait and mobility: Secondary | ICD-10-CM

## 2016-06-07 DIAGNOSIS — R41844 Frontal lobe and executive function deficit: Secondary | ICD-10-CM

## 2016-06-07 DIAGNOSIS — R29818 Other symptoms and signs involving the nervous system: Secondary | ICD-10-CM

## 2016-06-07 DIAGNOSIS — R293 Abnormal posture: Secondary | ICD-10-CM

## 2016-06-07 DIAGNOSIS — R2681 Unsteadiness on feet: Secondary | ICD-10-CM

## 2016-06-07 DIAGNOSIS — R41841 Cognitive communication deficit: Secondary | ICD-10-CM

## 2016-06-07 DIAGNOSIS — M6281 Muscle weakness (generalized): Secondary | ICD-10-CM

## 2016-06-07 DIAGNOSIS — R471 Dysarthria and anarthria: Secondary | ICD-10-CM

## 2016-06-07 DIAGNOSIS — M25622 Stiffness of left elbow, not elsewhere classified: Secondary | ICD-10-CM

## 2016-06-07 DIAGNOSIS — M25612 Stiffness of left shoulder, not elsewhere classified: Secondary | ICD-10-CM

## 2016-06-07 DIAGNOSIS — M25611 Stiffness of right shoulder, not elsewhere classified: Secondary | ICD-10-CM

## 2016-06-07 NOTE — Therapy (Signed)
[facility] [address] [place], [place], [address] Phone: [phone]   Fax:  [phone]  Occupational Therapy Treatment  Patient Details  Name: Judy Spears MRN: [id number] Date of Birth: [birth year] Referring Provider: Dr. Quita Skye. [name]  Encounter Date: 06/07/2016      OT End of Session - 06/07/16 1424    Visit Number 25   Number of Visits 32   Date for OT Re-Evaluation 07/08/16   Authorization Type [employer] unlimited visits   OT Start Time 1321   OT Stop Time 1401   OT Time Calculation (min) 40 min   Activity Tolerance Patient tolerated treatment well      Past Medical History:  Diagnosis Date  . Arthritis    lt great toe  . Complication of anesthesia    pt has had headaches post op-did advise to hydra well preop  . Hair loss    right-sided  . History of exercise stress test    a. ETT (12/15) with 12:00 exercise, no ST changes, occasional PACs.   . Hyperlipidemia   . Insomnia   . Migraine headache   . Movement disorder    resltess in left legs  . Postmenopausal   . Premature atrial contractions    a. Holter (12/15) with 8% PACs, no atrial runs or atrial fibrillation.     Past Surgical History:  Procedure Laterality Date  . BREAST BIOPSY  01/01/2011   Procedure: BREAST BIOPSY WITH NEEDLE LOCALIZATION;  Surgeon: Rolm Bookbinder, MD;  Location: [facility];  Service: General;  Laterality: Left;  left breast wire localization biopsy  . cataracts right eye    . CESAREAN SECTION  1986  . HERNIA REPAIR  2000   RIH  . opirectinal membrane peel    . RHINOPLASTY  1983    There were no vitals filed for this visit.      Subjective Assessment - 06/07/16 1411    Subjective  I took a shower in my new seat today   Patient is accompained by: Family member  caregiver [name]   Pertinent History see epic snapshot - ABI/encephalitis/hypoxia from virus; recent hospitalization due to ARF;    Patient Stated Goals Pt unable to state.    Currently in Pain? No/denies                      OT Treatments/Exercises (OP) - 06/07/16 0001      ADLs   UB Dressing Practiced donning shirt today - pt able to put both arms into shirt with cueing;  needs assist to place overhead and pull down in back- pt able to pull down in front with cueing. Pt then needs assist to take shirt off overhead but can then take shirt off both arms and hand to therapist with mod cues.    Bathing Pt able to wash face today with max cues using LUE.  Pt and caregiver report that pt's husband has purchased roll in shower seat and today pt was able to sit without assistance in  shower seat safely.  Recommended to enocurage pt to do as much washing as possible tomorrow in shower given that roll in shower seat provides needed postural support. Caregiver verbalized understanding and will start tomorrow.      Neurological Re-education Exercises   Other Exercises 2 Neuro re ed to address postural alignment in terms of A/P placement as well as tucking R rib cage and head alignment.  Worked  on holding this position while weight shifting side to side and side stepping using bilateral UE support.  Pt needs between min- mod a depending upon how actively engaged pt is for each step. Pt needs step by step cueing and benefits from max repetition due to cognitive deficits, apraxia , motor deficits, sensory deficits.                    OT Short Term Goals - 06/07/16 1421      OT SHORT TERM GOAL #1   Title Pt, husband and cargiver will be mod I with HEP - 03/29/2016   Status Achieved     OT SHORT TERM GOAL #2   Title Pt will be able to sit with supervision without UE support and vc's to assist with midline 75% while engage in functional task   Status Achieved     OT SHORT TERM GOAL #3   Title Pt will be able to complete sit to stand consistently with mod A for toilet transfers (max a for pivot)   Status Achieved      OT SHORT TERM GOAL #4   Title Pt, husband and caregiver will verbalize understanding of various bathroom accomodations to reduce long term caregiver burden   Status Achieved     OT SHORT TERM GOAL #5   Title Pt will be able to stand step transfer to toilet with mo more than mod a, mod vc's consistently using grab bar for community toileting needs- 06/10/2016 date adjusted as pt missed 2 weeks due to hospitalization   Status Achieved     OT SHORT TERM GOAL #6   Title Pt will be able to wash face with supervision/max cues 50% of the time, min a 50% of the time.    Status Achieved     OT SHORT TERM GOAL #7   Title Pt will be able to don pull over shirt with mod a, max cues   Status Achieved     OT SHORT TERM GOAL #8   Title Pt will be able to maintain static standing with UE support with close supervision to reduce burden of care for caregivers    Status On-going           OT Long Term Goals - 06/07/16 1422      OT LONG TERM GOAL #1   Title Pt, husband and caregiver will be mod I with upgraded HEP prn - 04/26/2016   Status Achieved     OT LONG TERM GOAL #2   Title Pt, husband and caregiver will be mod I with splint wear and care    Status Achieved     OT LONG TERM GOAL #3   Title Pt will be able to wash face with min a only   Status Achieved     OT LONG TERM GOAL #4   Title Pt will be able to sit without UE support with supevision 100% of the time with no more than 2 vc's while engaged in functional activity.   Status Achieved     OT LONG TERM GOAL #5   Title Pt, husband and caregiver will verbalize understanding of potential after care therapeutic opportunities. - 07/08/2016 date adjusted as pt missed 2 weeks due to hospitalization   Status On-going     OT LONG TERM GOAL #6   Title Pt will be supervision to wash face consistently   Status On-going     OT LONG TERM GOAL #7  Title Pt will don shirt with min a and max vc's 50% of the time   Status On-going     OT  LONG TERM GOAL #8   Title Pt will require min a for stand step transfers to toilet using grab bar for community toileting at least 75% of the time.   Status On-going     OT LONG TERM GOAL  #9   Baseline Pt will stand with contact gurard with only 1 UE support for static standing balance in prep for abiilty to engage in functional task while standing.    Status On-going               Plan - 06/07/16 1422    Clinical Impression Statement Pt making steady progress toward goals. Pt continues to improve in ability to follow simple one step commands for correction of postural alignment.    Rehab Potential Fair   Clinical Impairments Affecting Rehab Potential severity of deficits, very slow rate of recovery   OT Frequency 2x / week   OT Duration 8 weeks   OT Treatment/Interventions Self-care/ADL training;Ultrasound;Moist Heat;Electrical Stimulation;DME and/or AE instruction;Neuromuscular education;Therapeutic exercise;Functional Mobility Training;Manual Therapy;Passive range of motion;Splinting;Therapeutic activities;Balance training;Patient/family education;Visual/perceptual remediation/compensation;Cognitive remediation/compensation   Plan NMR for functional use of UE's, static to dynamic standing balance, postural control and orientation,trunk control, functional mobility especially active stand to sit.    Consulted and Agree with Plan of Care Patient;Family member/caregiver   Family Member Consulted caregiver Leesa       Patient will benefit from skilled therapeutic intervention in order to improve the following deficits and impairments:  Decreased endurance, Decreased skin integrity, Impaired vision/preception, Improper body mechanics, Impaired perceived functional ability, Decreased knowledge of precautions, Cardiopulmonary status limiting activity, Decreased activity tolerance, Decreased knowledge of use of DME, Decreased strength, Impaired flexibility, Improper spinal/pelvic alignment,  Impaired sensation, Difficulty walking, Decreased mobility, Decreased balance, Decreased cognition, Decreased range of motion, Impaired tone, Impaired UE functional use, Decreased safety awareness, Decreased coordination, Pain  Visit Diagnosis: Unsteadiness on feet  Abnormal posture  Muscle weakness (generalized)  Other symptoms and signs involving the nervous system  Stiffness of right shoulder, not elsewhere classified  Stiffness of left shoulder, not elsewhere classified  Stiffness of left elbow, not elsewhere classified  Frontal lobe and executive function deficit    Problem List Patient Active Problem List   Diagnosis Date Noted  . Heterotopic ossification of bone 03/10/2016  . Acute on chronic respiratory failure with hypoxia ([facility])   . Acute hypoxemic respiratory failure ([facility])   . Acute blood loss anemia   . Atelectasis 02/04/2016  . Debility 02/03/2016  . Pneumonia of both lower lobes due to Pseudomonas species ([facility])   . Pneumonia of both lower lobes due to methicillin susceptible Staphylococcus aureus (MSSA) ([facility])   . Acute respiratory failure with hypoxia ([facility])   . Acute encephalopathy   . Seizures ([facility])   . Sepsis ([facility])   . Hypoxia   . Pain   . HCAP (healthcare-associated pneumonia) 12/28/2015  . Chronic respiratory failure ([facility]) 12/28/2015  . Acute tracheobronchitis 12/24/2015  . Tracheostomy tube present ([facility])   . Tracheal stenosis   . Chronic respiratory failure with hypoxia ([facility])   . 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 Tracheostomy status ([facility]) 09/26/2015  . Allergic rhinitis 09/26/2015  . 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 Movement disorder 09/22/2015  . Encephalitis 09/22/2015  . Chest pain 02/07/2014  . Premature atrial contractions 01/13/2014  . Abnormality of gait 12/04/2013  . Hyperlipidemia  12/21/2011  . Cardiovascular risk factor 12/21/2011  . Bunion 01/31/2008  . Metatarsalgia of both feet 09/20/2007  . FLAT FOOT 09/20/2007  . HALLUX  RIGIDUS, ACQUIRED 09/20/2007    Quay Burow, OTR/L 06/07/2016, 2:26 PM  [facility] [address] [place], [place], [address] Phone: [phone]   Fax:  [phone]  Name: Judy Spears MRN: [id number] Date of Birth: Sep 12, 1950

## 2016-06-07 NOTE — Therapy (Signed)
[facility] [address] [place], [place], [address] Phone: [phone]   Fax:  [phone]  Speech Language Pathology Treatment  Patient Details  Name: Judy Spears MRN: [id number] Date of Birth: 18-Feb-1950 Referring Provider: Charlett Blake, MD  Encounter Date: 06/07/2016      End of Session - 06/07/16 1747    Visit Number 25   Number of Visits 33   Date for SLP Re-Evaluation 06/25/16   SLP Start Time 1450   SLP Stop Time  5366   SLP Time Calculation (min) 43 min   Activity Tolerance Patient tolerated treatment well      Past Medical History:  Diagnosis Date  . Arthritis    lt great toe  . Complication of anesthesia    pt has had headaches post op-did advise to hydra well preop  . Hair loss    right-sided  . History of exercise stress test    a. ETT (12/15) with 12:00 exercise, no ST changes, occasional PACs.   . Hyperlipidemia   . Insomnia   . Migraine headache   . Movement disorder    resltess in left legs  . Postmenopausal   . Premature atrial contractions    a. Holter (12/15) with 8% PACs, no atrial runs or atrial fibrillation.     Past Surgical History:  Procedure Laterality Date  . BREAST BIOPSY  01/01/2011   Procedure: BREAST BIOPSY WITH NEEDLE LOCALIZATION;  Surgeon: Rolm Bookbinder, MD;  Location: [facility];  Service: General;  Laterality: Left;  left breast wire localization biopsy  . cataracts right eye    . CESAREAN SECTION  1986  . HERNIA REPAIR  2000   RIH  . opirectinal membrane peel    . RHINOPLASTY  1983    There were no vitals filed for this visit.      Subjective Assessment - 06/07/16 1510    Subjective "We-went-to-(friend's name)-house-for-lunch"   Patient is accompained by: Family member   Special Tests spouse, Judy Spears; caregiver Judy Spears   Currently in Pain? No/denies               ADULT SLP TREATMENT - 06/07/16 1510      General Information    Behavior/Cognition Alert;Cooperative;Pleasant mood   Patient Positioning Upright in chair   Oral care provided Yes     Treatment Provided   Treatment provided Dysphagia;Cognitive-Linquistic     Dysphagia Treatment   Temperature Spikes Noted No   Respiratory Status Room air   Oral Cavity - Dentition Adequate natural dentition   Treatment Methods Skilled observation;Upgraded PO texture trial;Patient/caregiver education   Patient observed directly with PO's Yes   Type of PO's observed Thin liquids;Dysphagia 1 (puree)   Feeding Able to feed self;Needs assist   Liquids provided via Cup   Oral Phase Signs & Symptoms Anterior loss/spillage   Pharyngeal Phase Signs & Symptoms Delayed cough   Type of cueing Verbal;Tactile;Visual   Amount of cueing Minimal   Other treatment/comments SLP performed oral care to maximize safety for PO trials. Pt with mild xerostomia, no significant secretion buildup. Pt able to grasp cup and retrieve sips of water with min SLP assistance. Pt with consecutive swallows of thin water with appearance of timely swallow response, occasional anterior oral spillage. She independently elicits or responds to min visual cue for subsequent lip closure, dry swallow 90% of trials. She consumed 8 oz water; single delayed cough noted x1 after rapid intake; with verbal instruction  to reduce rate no further overt signs of aspiration. Vocal quality is clear and strong throughout PO trials. Self feeds 2 teaspoons pureed solids with moderate assistance; min verbal cues for multiple dry swallows for bolus clearance. Oral care performed upon completion of PO.     Pain Assessment   Pain Assessment No/denies pain     Cognitive-Linquistic Treatment   Treatment focused on Voice;Dysarthria;Patient/family/caregiver education   Skilled Treatment Pt with adequate breath support, loudness, articulatory precision for short conversational responses today. She required rare min A to swallow  intermittently and for vocal amplitude. Controls rate of speech well independently today and is noted to swallow several times without need for cuing to improve vocal quality. 85% intelligibility for short conversational responses ranging from 1-8 words.      Tracheostomy Shiley 4 mm Uncuffed   Tracheostomy Properties Placement Date: 05/08/16 Placement Time: 1030 Placed By: ICU physician Brand: Shiley Size (mm): 4 mm Style: Uncuffed   Status Capped   Site Assessment Clean;Dry   Ties Assessment Clean;Dry;Secure     Assessment / Recommendations / Plan   Plan Continue with current plan of care     Dysphagia Recommendations   Diet recommendations Thin liquid;Dysphagia 1 (puree)   Liquids provided via Teaspoon;Cup   Medication Administration Via alternative means   Supervision Full supervision/cueing for compensatory strategies   Compensations Effortful swallow;Multiple dry swallows after each bite/sip;Check for anterior loss;Small sips/bites;Slow rate   Postural Changes and/or Swallow Maneuvers Seated upright 90 degrees     Progression Toward Goals   Progression toward goals Progressing toward goals          SLP Education - 06/07/16 1746    Education provided Yes   Education Details resuming small volume pleasure feeds of thin liquids, purees, strict oral care before and after PO   Person(s) Educated Patient;Spouse;Caregiver(s)   Methods Explanation;Demonstration;Verbal cues   Comprehension Verbalized understanding          SLP Short Term Goals - 06/07/16 1753      SLP SHORT TERM GOAL #1   Title Patient will utilize intelligibility strategies with moderate visual cues from communication partner/visual aid to communicate 5-7 word wants, needs, thoughts and ideas.   Baseline 03/15/16, 04/05/16, 04/12/16, 04/19/16, 05/27/16   Time 1   Period Weeks   Status Achieved     SLP SHORT TERM GOAL #2   Title Patient will utilize intelligibility strategies, breath support with mod A for  phrase repetition in 75% of trials.   Baseline 2.12.18, 04/05/16, 04/08/16, 04/12/16   Time 1   Period Weeks   Status Achieved     SLP SHORT TERM GOAL #3   Title Pt will swallow thin liquids, purees and soft solids in 9/10 trials with no overt signs of aspiration.   Baseline 2.12.18, 03-12-16, 03-15-16   Time 4   Period Weeks   Status Achieved     SLP SHORT TERM GOAL #4   Title Patient will tolerate 6oz water, >95% with no overt signs of aspiration.   Baseline Husband reports pt ate "a whole bowl of soup" with no overt s/sx aspiration   Period Weeks   Status Achieved     SLP SHORT TERM GOAL #5   Title Patient will utilize verbal cues for lip closure, dry swallow for secretion management in 75% of opportunities.   Baseline 80% mod cues 4/30 ; 5/3-  80% min-mod cues   Time 1   Period Weeks   Status Achieved  SLP Long Term Goals - 06/07/16 1754      SLP LONG TERM GOAL #1   Title Patient will utilize multimodal communication/AAC to clarify wants, needs, thoughts or ideas during communication breakdowns with max A   Time 2   Period Weeks   Status Deferred     SLP LONG TERM GOAL #2   Title Patient will utilize intelligibility strategies, breath support with min A visual cues for 5-7 word responses with 80% accuracy.   Time 3   Period Weeks   Status On-going     SLP LONG TERM GOAL #3   Title Patient will demonstrate readiness for MBS to determine safety for pleasure feeds by timely initiation of swallow in 3/5 trials with ice chips/H20.   Baseline 2.12.18   Time 2   Period Weeks   Status Achieved     SLP LONG TERM GOAL #4   Title Patient will adequately masticate and clear soft solids with mod verbal cues and no overt signs of aspiration in 4/5 trials.   Baseline 04/19/16: Judy Spears reports whole bowl of pureed soup   Time 5   Period Weeks   Status Deferred     SLP LONG TERM GOAL #5   Title Patient will utilize visual cue for lip closure, dry swallow for secretion  management in 75% of opportunities.   Baseline 5.7.18, 06/07/16   Time 3   Period Weeks   Status On-going          Plan - 06/07/16 1751    Clinical Impression Statement Patient is alert and attentive throughout session despite ST being 3rd appointment this afternoon. Swallow response remains timely today, with patient initiating strategy use independently in several instances. Continues to self feed cup sips of water with min A, which appears to improve automaticity of swallow response and lip closure; requires min A to reduce rate. Manages purees with no overt signs of aspiration and min A for dry swallows for oral clearance. Good use of slowed rate of speech, reduced need for cuing for vocal amplitude and dry swallows for intelligibility. Recommend cont' d skilled ST in order to maximize functional communication, swallowing safety, reduce caregiver burden and improve quality of life.   Speech Therapy Frequency 2x / week   Treatment/Interventions Aspiration precaution training;Trials of upgraded texture/liquids;Compensatory strategies;Functional tasks;Patient/family education;Cueing hierarchy;Diet toleration management by SLP;Cognitive reorganization;Compensatory techniques;SLP instruction and feedback;Internal/external aids;Multimodal communcation approach   Potential to Achieve Goals Good   Potential Considerations Ability to learn/carryover information;Co-morbidities;Severity of impairments   SLP Home Exercise Plan Pleasure feeds purees, thin liquids, strict oral care before and after PO, monitor for signs of fatigue, aspiration   Consulted and Agree with Plan of Care Family member/caregiver;Patient   Family Member Consulted husband Judy Spears, caregiver lisa      Patient will benefit from skilled therapeutic intervention in order to improve the following deficits and impairments:   Dysphagia, oropharyngeal phase  Dysarthria and anarthria  Cognitive communication deficit    Problem  List Patient Active Problem List   Diagnosis Date Noted  . Heterotopic ossification of bone 03/10/2016  . Acute on chronic respiratory failure with hypoxia ([facility])   . Acute hypoxemic respiratory failure ([facility])   . Acute blood loss anemia   . Atelectasis 02/04/2016  . Debility 02/03/2016  . Pneumonia of both lower lobes due to Pseudomonas species ([facility])   . Pneumonia of both lower lobes due to methicillin susceptible Staphylococcus aureus (MSSA) ([facility])   . Acute respiratory failure with hypoxia ([facility])   .  Acute encephalopathy   . Seizures ([facility])   . Sepsis ([facility])   . Hypoxia   . Pain   . HCAP (healthcare-associated pneumonia) 12/28/2015  . Chronic respiratory failure ([facility]) 12/28/2015  . Acute tracheobronchitis 12/24/2015  . Tracheostomy tube present ([facility])   . Tracheal stenosis   . Chronic respiratory failure with hypoxia ([facility])   . 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 Tracheostomy status ([facility]) 09/26/2015  . Allergic rhinitis 09/26/2015  . 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 Movement disorder 09/22/2015  . Encephalitis 09/22/2015  . Chest pain 02/07/2014  . Premature atrial contractions 01/13/2014  . Abnormality of gait 12/04/2013  . Hyperlipidemia 12/21/2011  . Cardiovascular risk factor 12/21/2011  . Bunion 01/31/2008  . Metatarsalgia of both feet 09/20/2007  . FLAT FOOT 09/20/2007  . HALLUX RIGIDUS, ACQUIRED 09/20/2007   Deneise Lever, [place], [place] 06/07/2016, 5:59 PM  [facility] [address] [facility], [place], [address] Phone: [phone]   Fax:  [phone]   Name: Judy Spears MRN: [id number] Date of Birth: 1950-05-09

## 2016-06-08 NOTE — Telephone Encounter (Signed)
Incoming fax received yesterday and today asking for refills on Valacyclovir HCL 500 mg #30...Marland KitchenMarland KitchenMarland Kitchenplease advise....looks like hospital discharge medication....Marland KitchenMarland Kitchen

## 2016-06-08 NOTE — Therapy (Signed)
[facility] [address] [place], [place], [address] Phone: [phone]   Fax:  [phone]  Physical Therapy Treatment  Patient Details  Name: Judy Spears MRN: [id number] Date of Birth: April 05, 1950 Referring Provider: Faith Rogue, MD/Andrew Wynn Banker, MD  Encounter Date: 06/07/2016      PT End of Session - 06/08/16 2225    Visit Number 26   Number of Visits 36   Date for PT Re-Evaluation 06/29/16   Authorization Type BCBS   PT Start Time 1402   PT Stop Time 1447   PT Time Calculation (min) 45 min      Past Medical History:  Diagnosis Date  . Arthritis    lt great toe  . Complication of anesthesia    pt has had headaches post op-did advise to hydra well preop  . Hair loss    right-sided  . History of exercise stress test    a. ETT (12/15) with 12:00 exercise, no ST changes, occasional PACs.   . Hyperlipidemia   . Insomnia   . Migraine headache   . Movement disorder    resltess in left legs  . Postmenopausal   . Premature atrial contractions    a. Holter (12/15) with 8% PACs, no atrial runs or atrial fibrillation.     Past Surgical History:  Procedure Laterality Date  . BREAST BIOPSY  01/01/2011   Procedure: BREAST BIOPSY WITH NEEDLE LOCALIZATION;  Surgeon: Emelia Loron, MD;  Location:  SUR[facility]ERY C[facility];  Service: General;  Laterality: Left;  left breast wire localization biopsy  . cataracts right eye    . CESAREAN SECTION  1986  . HERNIA REPAIR  2000   RIH  . opirectinal membrane peel    . RHINOPLASTY  1983    There were no vitals filed for this visit.      Subjective Assessment - 06/08/16 2217    Subjective Pt/caregiver report no changes or problems since previous PT session last week   Patient is accompained by: Family member   Pertinent History Precautions:  PEG.  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  Another round of botox in LLE on 02/06/16   Patient Stated Goals Per husband, "For Naselle to be as independent as possible."   Currently in Pain? No/denies                         [facility] Adult PT Treatment/Exercise - 06/08/16 0001      Transfers   Transfers Sit to Stand   Sit to Stand 4: Min assist;3: Mod assist   Sit to Stand Details Tactile cues for placement;Verbal cues for technique;Manual facilitation for weight shifting;Verbal cues for gait pattern   Sit to Stand Details (indicate cue type and reason) cues to lean anteriorly; tactile cues for hand placement   Stand to Sit 4: Min assist   Stand to Sit Details (indicate cue type and reason) Verbal cues for technique;Tactile cues for placement   Stand to Sit Details cues to lean anteriorly     Ambulation/Gait   Ambulation/Gait Yes   Ambulation/Gait Assistance 3: Mod assist;4: Min assist  varies depending on fatigue   Ambulation/Gait Assistance Details cues to "tuck" Rt side of trunk and tactile cues to laterally flex head to Rt side   Ambulation  Distance (Feet) 115 Feet   Assistive device Rolling walker  UP walker used   Gait Pattern Decreased stance time - right;Decreased hip/knee flexion - right;Decreased hip/knee flexion - left;Decreased weight shift to right;Scissoring;Ataxic   Ambulation Surface Level;Indoor      NeuroRe-ed;  Worked on standing balance inside // bars - reaching for cones on Rt side with LUE to facilitate Lt trunk Elongation Sidestepping inside // bars with cues to tuck Rt rib cage and tactile cues to laterally flex head to Rt side - 10' x 4 reps             PT Short Term Goals - 05/28/16 1522      PT SHORT TERM GOAL #1   Title Pt will consistently perform supine rolling in B directions at S level (with rails as needed) in order to indicate improved trunk dissociation  and increaed independence with bed mobility.  (Target Date for STGs: 05/28/16)   Baseline min assist needed per husband's report - 05-24-16   Time 4   Period Weeks   Status On-going     PT SHORT TERM GOAL #2   Title Pt will consistently perform R SL>sit at mod A level in order to indicate increased independence with getting out of bed.     Baseline inconsistently met per husband's report - 05-27-16   Time 4   Period Weeks   Status On-going     PT SHORT TERM GOAL #3   Title Pt will consistently perform L SL>sit at min A level in order to indicate increased independene with getting out of bed/off of exercise mat table.    Baseline mod to min assist needed - 05-27-16   Status Partially Met     PT SHORT TERM GOAL #4   Title Pt will consistently perform sit to SL R and L at mod A level in order to indicate improved independence with bed mobility.     Baseline met 05-28-16   Status Achieved     PT SHORT TERM GOAL #5   Title Pt will perform transfers from w/c to seated surface at min/guard level in order to indicate improved independence with transfers.    Baseline mod assist needed - 05-24-16   Status On-going     PT SHORT TERM GOAL #6   Title Pt will perform static standing with BUE support x 3 mins at S level with no overt LOB to decrease burden of care and increase independence with ADLs.    Baseline Pt able to stand for 3" but amount of assist varies - supervision to mod assist depending on fatigue - 05-28-16   Time 4   Period Weeks   Status On-going     PT SHORT TERM GOAL #7   Title Pt will perform dynamic sitting at [facility] with intermittent UE support, reaching outside of BOS x 5 mins in order to indicate safety with ADLs.    Baseline met 05-24-16   Status Achieved           PT Long Term Goals - 05/01/16 0953      PT LONG TERM GOAL #1   Title Pt will perform supine rolling R and L at mod I level in order to indicate improved independence with bed mobility.  (Target Date: 06/25/16)    Time 8   Period Weeks   Status New     PT LONG TERM GOAL #2   Title Pt will perform sit>SL R or L at min A  level in order to indicate improved independence with getting into bed.    Time 8   Period Weeks   Status New     PT LONG TERM GOAL #3   Title Pt will perform SL R to sit at min A level, SL L to sit at S level in order to indicate increased independence with getting out of bed.     Time 8   Period Weeks   Status New     PT LONG TERM GOAL #4   Title Pt will perform transfers at S level 50% of the time in order to indicate improved functional independence.    Time 8   Period Weeks   Status New     PT LONG TERM GOAL #5   Title Pt will be able to stand with single UE support while performing opposite UE task x 3 mins at S level in order to indicate improved independence with ADLs.    Time 8   Period Weeks   Status New     Additional Long Term Goals   Additional Long Term Goals Yes     PT LONG TERM GOAL #6   Title Pt will demonstrate ability to ambulate up to 25' w/ LRAD at min A level in order to assist with getting around home.     Time 8   Period Weeks   Status New               Plan - 06/08/16 2227    Clinical Impression Statement Pt is improving with maintaining standing balance inside // bars with UE support with pt standing for approx. 60 secs with bil. UE suppport with close SBA:  posture during gait training is also improving   Rehab Potential Fair   Clinical Impairments Affecting Rehab Potential Chronicity of condition, continued medical management, but has very strong family and financial support   PT Frequency 2x / week   PT Duration 8 weeks   PT Treatment/Interventions ADLs/Self Care Home Management;DME Instruction;Gait training;Stair training;Functional mobility training;Therapeutic activities;Patient/family education;Cognitive remediation;Neuromuscular re-education;Therapeutic exercise;Balance training;Orthotic Fit/Training;Wheelchair mobility  training;Manual techniques;Splinting;Visual/perceptual remediation/compensation;Vestibular;Passive range of motion;Electrical Stimulation   PT Next Visit Plan gait training; standing balance and transfers, L trunk lengthening, active trunk movement   Consulted and Agree with Plan of Care Patient   Family Member Consulted caregiver, [name] and husband [name]      Patient will benefit from skilled therapeutic intervention in order to improve the following deficits and impairments:  Abnormal gait, Decreased balance, Decreased endurance, Decreased cognition, Cardiopulmonary status limiting activity, Decreased activity tolerance, Decreased coordination, Decreased strength, Impaired flexibility, Impaired tone, Decreased mobility, Increased muscle spasms, Impaired vision/preception, Decreased range of motion, Impaired UE functional use, Postural dysfunction, Improper body mechanics, Impaired perceived functional ability, Decreased knowledge of precautions, Decreased knowledge of use of DME  Visit Diagnosis: Other abnormalities of gait and mobility  Abnormal posture  Unsteadiness on feet     Problem List Patient Active Problem List   Diagnosis Date Noted  . Heterotopic ossification of bone 03/10/2016  . Acute on chronic respiratory failure with hypoxia ([place])   . Acute hypoxemic respiratory failure ([place])   . Acute blood loss anemia   . Atelectasis 02/04/2016  . Debility 02/03/2016  . Pneumonia of both lower lobes due to Pseudomonas species ([place])   . Pneumonia of both lower lobes due to methicillin susceptible Staphylococcus aureus (MSSA) ([place])   . Acute respiratory failure with hypoxia ([place])   . Acute encephalopathy   .  Seizures ([place])   . Sepsis ([place])   . Hypoxia   . Pain   . HCAP (healthcare-associated pneumonia) 12/28/2015  . Chronic respiratory failure ([place]) 12/28/2015  . Acute tracheobronchitis 12/24/2015  . Tracheostomy tube present ([place])   . Tracheal stenosis   . Chronic respiratory  failure with hypoxia ([place])   . 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 Tracheostomy status ([place]) 09/26/2015  . Allergic rhinitis 09/26/2015  . 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 Movement disorder 09/22/2015  . Encephalitis 09/22/2015  . Chest pain 02/07/2014  . Premature atrial contractions 01/13/2014  . Abnormality of gait 12/04/2013  . Hyperlipidemia 12/21/2011  . Cardiovascular risk factor 12/21/2011  . Bunion 01/31/2008  . Metatarsalgia of both feet 09/20/2007  . FLAT FOOT 09/20/2007  . HALLUX RIGIDUS, ACQUIRED 09/20/2007    Alda Lea, PT 06/08/2016, 10:32 PM  [facility] [address] [place], [place], [address] Phone: [phone]   Fax:  [phone]  Name: Judy Spears MRN: [id number] Date of Birth: 1951/01/04

## 2016-06-09 ENCOUNTER — Encounter: Payer: Self-pay | Admitting: Occupational Therapy

## 2016-06-09 ENCOUNTER — Ambulatory Visit: Payer: Medicare Other | Admitting: Occupational Therapy

## 2016-06-09 DIAGNOSIS — M6281 Muscle weakness (generalized): Secondary | ICD-10-CM

## 2016-06-09 DIAGNOSIS — R278 Other lack of coordination: Secondary | ICD-10-CM

## 2016-06-09 DIAGNOSIS — R2689 Other abnormalities of gait and mobility: Secondary | ICD-10-CM | POA: Diagnosis not present

## 2016-06-09 DIAGNOSIS — M25622 Stiffness of left elbow, not elsewhere classified: Secondary | ICD-10-CM

## 2016-06-09 DIAGNOSIS — R2681 Unsteadiness on feet: Secondary | ICD-10-CM

## 2016-06-09 DIAGNOSIS — R293 Abnormal posture: Secondary | ICD-10-CM

## 2016-06-09 DIAGNOSIS — M25611 Stiffness of right shoulder, not elsewhere classified: Secondary | ICD-10-CM

## 2016-06-09 DIAGNOSIS — M25612 Stiffness of left shoulder, not elsewhere classified: Secondary | ICD-10-CM

## 2016-06-09 DIAGNOSIS — R29818 Other symptoms and signs involving the nervous system: Secondary | ICD-10-CM

## 2016-06-09 DIAGNOSIS — R41844 Frontal lobe and executive function deficit: Secondary | ICD-10-CM

## 2016-06-09 NOTE — Therapy (Signed)
[facility] [address] [place], [place], [address] Phone: [phone]   Fax:  [phone]  Occupational Therapy Treatment  Patient Details  Name: Judy Spears MRN: [id number] Date of Birth: 10/08/1950 Referring Provider: Dr. Quita Skye. [name]  Encounter Date: 06/09/2016      OT End of Session - 06/09/16 1442    Visit Number 26   Number of Visits 32   Date for OT Re-Evaluation 07/08/16   Authorization Type BCBS unlimited visits   OT Start Time 1318   OT Stop Time 1403   OT Time Calculation (min) 45 min   Activity Tolerance Patient tolerated treatment well   Behavior During Therapy [facility] for tasks assessed/performed      Past Medical History:  Diagnosis Date  . Arthritis    lt great toe  . Complication of anesthesia    pt has had headaches post op-did advise to hydra well preop  . Hair loss    right-sided  . History of exercise stress test    a. ETT (12/15) with 12:00 exercise, no ST changes, occasional PACs.   . Hyperlipidemia   . Insomnia   . Migraine headache   . 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 Postmenopausal   . Premature atrial contractions    a. Holter (12/15) with 8% PACs, no atrial runs or atrial fibrillation.     Past Surgical History:  Procedure Laterality Date  . BREAST BIOPSY  01/01/2011   Procedure: BREAST BIOPSY WITH NEEDLE LOCALIZATION;  Surgeon: Rolm Bookbinder, MD;  Location: [facility];  Service: General;  Laterality: Left;  left breast wire localization biopsy  . cataracts right eye    . CESAREAN SECTION  1986  . HERNIA REPAIR  2000   RIH  . opirectinal membrane peel    . RHINOPLASTY  1983    There were no vitals filed for this visit.      Subjective Assessment - 06/09/16 1431    Subjective  Patient indicated she really enjoys going to [employer]   Patient is accompained by: Family member   Pertinent History see epic snapshot -  ABI/encephalitis/hypoxia from virus; recent hospitalization due to ARF;    Currently in Pain? No/denies   Pain Score 0-No pain                      OT Treatments/Exercises (OP) - 06/09/16 0001      Neurological Re-education Exercises   Other Exercises 1 Neuromuscular reeducation to address static to dynamic standing balance with decreasing reliance on upper extremities- to free UE's for function.  Worked on transition to/from stand with control during mid range.  Patient with tendency to move too quickly, using momentum versus control.  Worked on maintaining hold on large beach ball during transition and adding slight rotational component to hand off ball to right or left.  Patient did better relaxing to release versus attempting fast movement to toss ball while standing.                   OT Education - 06/09/16 1442    Education provided No          OT Short Term Goals - 06/07/16 1421      OT SHORT TERM GOAL #1   Title Pt, husband and cargiver will be mod I with HEP - 03/29/2016   Status Achieved     OT SHORT TERM GOAL #2  Title Pt will be able to sit with supervision without UE support and vc's to assist with midline 75% while engage in functional task   Status Achieved     OT SHORT TERM GOAL #3   Title Pt will be able to complete sit to stand consistently with mod A for toilet transfers (max a for pivot)   Status Achieved     OT SHORT TERM GOAL #4   Title Pt, husband and caregiver will verbalize understanding of various bathroom accomodations to reduce long term caregiver burden   Status Achieved     OT SHORT TERM GOAL #5   Title Pt will be able to stand step transfer to toilet with mo more than mod a, mod vc's consistently using grab bar for community toileting needs- 06/10/2016 date adjusted as pt missed 2 weeks due to hospitalization   Status Achieved     OT SHORT TERM GOAL #6   Title Pt will be able to wash face with supervision/max cues 50% of  the time, min a 50% of the time.    Status Achieved     OT SHORT TERM GOAL #7   Title Pt will be able to don pull over shirt with mod a, max cues   Status Achieved     OT SHORT TERM GOAL #8   Title Pt will be able to maintain static standing with UE support with close supervision to reduce burden of care for caregivers    Status On-going           OT Long Term Goals - 06/07/16 1422      OT LONG TERM GOAL #1   Title Pt, husband and caregiver will be mod I with upgraded HEP prn - 04/26/2016   Status Achieved     OT LONG TERM GOAL #2   Title Pt, husband and caregiver will be mod I with splint wear and care    Status Achieved     OT LONG TERM GOAL #3   Title Pt will be able to wash face with min a only   Status Achieved     OT LONG TERM GOAL #4   Title Pt will be able to sit without UE support with supevision 100% of the time with no more than 2 vc's while engaged in functional activity.   Status Achieved     OT LONG TERM GOAL #5   Title Pt, husband and caregiver will verbalize understanding of potential after care therapeutic opportunities. - 07/08/2016 date adjusted as pt missed 2 weeks due to hospitalization   Status On-going     OT LONG TERM GOAL #6   Title Pt will be supervision to wash face consistently   Status On-going     OT LONG TERM GOAL #7   Title Pt will don shirt with min a and max vc's 50% of the time   Status On-going     OT LONG TERM GOAL #8   Title Pt will require min a for stand step transfers to toilet using grab bar for community toileting at least 75% of the time.   Status On-going     OT LONG TERM GOAL  #9   Baseline Pt will stand with contact gurard with only 1 UE support for static standing balance in prep for abiilty to engage in functional task while standing.    Status On-going               Plan - 06/09/16 1443  Clinical Impression Statement Patient continues to show steady improvement toward OT goals- with improved postural  control,a nd improved participation with ADL in home   Rehab Potential Fair   Clinical Impairments Affecting Rehab Potential severity of deficits, very slow rate of recovery   OT Frequency 2x / week   OT Duration 8 weeks   OT Treatment/Interventions Self-care/ADL training;Ultrasound;Moist Heat;Electrical Stimulation;DME and/or AE instruction;Neuromuscular education;Therapeutic exercise;Functional Mobility Training;Manual Therapy;Passive range of motion;Splinting;Therapeutic activities;Balance training;Patient/family education;Visual/perceptual remediation/compensation;Cognitive remediation/compensation   Plan NMR for functional use of UE's, static to dynamic stand balance, postrual control   OT Home Exercise Plan Reviewed plan for home management for left elbow range of motion - JAS 3x/day FOR 30 min./time, and locked elbow extesnion brace at night   Consulted and Agree with Plan of Care Patient;Family member/caregiver   Family Member Consulted caregiver Leesa       Patient will benefit from skilled therapeutic intervention in order to improve the following deficits and impairments:     Visit Diagnosis: Unsteadiness on feet  Abnormal posture  Muscle weakness (generalized)  Other symptoms and signs involving the nervous system  Stiffness of right shoulder, not elsewhere classified  Stiffness of left shoulder, not elsewhere classified  Stiffness of left elbow, not elsewhere classified  Frontal lobe and executive function deficit  Other lack of coordination    Problem List Patient Active Problem List   Diagnosis Date Noted  . Heterotopic ossification of bone 03/10/2016  . Acute on chronic respiratory failure with hypoxia ([place])   . Acute hypoxemic respiratory failure ([place])   . Acute blood loss anemia   . Atelectasis 02/04/2016  . Debility 02/03/2016  . Pneumonia of both lower lobes due to Pseudomonas species ([place])   . Pneumonia of both lower lobes due to methicillin  susceptible Staphylococcus aureus (MSSA) ([place])   . Acute respiratory failure with hypoxia ([place])   . Acute encephalopathy   . Seizures ([place])   . Sepsis ([place])   . Hypoxia   . Pain   . HCAP (healthcare-associated pneumonia) 12/28/2015  . Chronic respiratory failure ([place]) 12/28/2015  . Acute tracheobronchitis 12/24/2015  . Tracheostomy tube present ([place])   . Tracheal stenosis   . Chronic respiratory failure with hypoxia ([place])   . 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[place]) 10/20/2015  . Tracheostomy status ([place]) 09/26/2015  . Allergic rhinitis 09/26/2015  . 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 Movement disorder 09/22/2015  . Encephalitis 09/22/2015  . Chest pain 02/07/2014  . Premature atrial contractions 01/13/2014  . Abnormality of gait 12/04/2013  . Hyperlipidemia 12/21/2011  . Cardiovascular risk factor 12/21/2011  . Bunion 01/31/2008  . Metatarsalgia of both feet 09/20/2007  . FLAT FOOT 09/20/2007  . HALLUX RIGIDUS, ACQUIRED 09/20/2007    Mariah Milling, OTR/L 06/09/2016, 2:45 PM  [facility] [address] [place], [place], [address] Phone: [phone]   Fax:  [phone]  Name: Judy Spears MRN: [id number] Date of Birth: 03-12-1950

## 2016-06-10 ENCOUNTER — Ambulatory Visit: Payer: Medicare Other | Admitting: Physical Therapy

## 2016-06-10 ENCOUNTER — Encounter: Payer: BC Managed Care – PPO | Admitting: Occupational Therapy

## 2016-06-10 ENCOUNTER — Ambulatory Visit: Payer: Medicare Other | Admitting: Speech Pathology

## 2016-06-10 ENCOUNTER — Telehealth: Payer: Self-pay | Admitting: *Deleted

## 2016-06-10 DIAGNOSIS — R471 Dysarthria and anarthria: Secondary | ICD-10-CM

## 2016-06-10 DIAGNOSIS — M6281 Muscle weakness (generalized): Secondary | ICD-10-CM

## 2016-06-10 DIAGNOSIS — R2689 Other abnormalities of gait and mobility: Secondary | ICD-10-CM | POA: Diagnosis not present

## 2016-06-10 DIAGNOSIS — R293 Abnormal posture: Secondary | ICD-10-CM

## 2016-06-10 NOTE — Telephone Encounter (Signed)
Was in contact with Dr. [name] : Valtrex refilled.

## 2016-06-10 NOTE — Therapy (Signed)
[facility] [address] [place], [place], [address] Phone: [phone]   Fax:  [phone]  Speech Language Pathology Treatment  Patient Details  Name: Judy Spears MRN: [id number] Date of Birth: 01-14-1951 Referring Provider: Erick Colace, MD  Encounter Date: 06/10/2016      End of Session - 06/10/16 1914    Visit Number 26   Number of Visits 33   Date for SLP Re-Evaluation 06/25/16   SLP Start Time 1404   SLP Stop Time  1445   SLP Time Calculation (min) 41 min   Activity Tolerance Patient tolerated treatment well      Past Medical History:  Diagnosis Date  . Arthritis    lt great toe  . Complication of anesthesia    pt has had headaches post op-did advise to hydra well preop  . Hair loss    right-sided  . History of exercise stress test    a. ETT (12/15) with 12:00 exercise, no ST changes, occasional PACs.   . Hyperlipidemia   . Insomnia   . Migraine headache   . Movement disorder    resltess in left legs  . Postmenopausal   . Premature atrial contractions    a. Holter (12/15) with 8% PACs, no atrial runs or atrial fibrillation.     Past Surgical History:  Procedure Laterality Date  . BREAST BIOPSY  01/01/2011   Procedure: BREAST BIOPSY WITH NEEDLE LOCALIZATION;  Surgeon: Emelia Loron, MD;  Location: [facility];  Service: General;  Laterality: Left;  left breast wire localization biopsy  . cataracts right eye    . CESAREAN SECTION  1986  . HERNIA REPAIR  2000   RIH  . opirectinal membrane peel    . RHINOPLASTY  1983    There were no vitals filed for this visit.      Subjective Assessment - 06/10/16 1831    Subjective Inaudible greeting to SLP   Patient is accompained by: Family member   Special Tests spouse, [name]; caregiver Misty Stanley   Currently in Pain? No/denies               ADULT SLP TREATMENT - 06/10/16 0001      General Information   Behavior/Cognition Alert;Cooperative;Pleasant mood   Patient Positioning Upright in chair   Oral care provided N/A     Treatment Provided   Treatment provided Cognitive-Linquistic     Pain Assessment   Pain Assessment No/denies pain     Cognitive-Linquistic Treatment   Treatment focused on Dysarthria;Voice;Patient/family/caregiver education   Skilled Treatment SLP cued pt in completing loud /a/ to improve breath support and loudness. Pt completed 4/5 trials at an average of 72.5 dB at 30 cm and for duration of 2-3 seconds on average, min A. Final trial pt with discoordination of breath/phonation, possible fatigue leading to loudest attainable volume of 52dB despite max A, demonstration. Short conversational response, 3-5 word phrase level structured speech tasks with increased cognitive load pt required occasional mod A for breath/voicing coordination, amplitude. 60% of responses above 65 dB at 30 cm.     Tracheostomy Shiley 4 mm Uncuffed   Tracheostomy Properties Placement Date: 05/08/16 Placement Time: 1030 Placed By: ICU physician Brand: Shiley Size (mm): 4 mm Style: Uncuffed   Status Capped   Site Assessment Clean;Dry   Ties Assessment Clean;Dry;Secure     Assessment / Recommendations / Plan   Plan Continue with current plan of care     Progression  Toward Goals   Progression toward goals Progressing toward goals     General Information   Reason PO's not observed Other (comment)  Targeted voice treatment only today          SLP Education - 06/10/16 1913    Education provided Yes   Education Details conversational volume, posture for improved breath support   Person(s) Educated Patient;Spouse;Caregiver(s)   Methods Explanation;Demonstration;Verbal cues   Comprehension Verbalized understanding          SLP Short Term Goals - 06/10/16 1917      SLP SHORT TERM GOAL #1   Status Achieved     SLP SHORT TERM GOAL #2   Title Patient will utilize intelligibility  strategies, breath support with mod A for phrase repetition in 75% of trials.   Status Achieved     SLP SHORT TERM GOAL #3   Title Pt will swallow thin liquids, purees and soft solids in 9/10 trials with no overt signs of aspiration.   Status Achieved     SLP SHORT TERM GOAL #4   Title Patient will tolerate 6oz water, >95% with no overt signs of aspiration.   Status Achieved     SLP SHORT TERM GOAL #5   Title Patient will utilize verbal cues for lip closure, dry swallow for secretion management in 75% of opportunities.   Status Achieved          SLP Long Term Goals - 06/10/16 1917      SLP LONG TERM GOAL #1   Title Patient will utilize multimodal communication/AAC to clarify wants, needs, thoughts or ideas during communication breakdowns with max A   Status Deferred     SLP LONG TERM GOAL #2   Title Patient will utilize intelligibility strategies, breath support with min A visual cues for 5-7 word responses with 80% accuracy.   Time 3   Period Weeks   Status On-going     SLP LONG TERM GOAL #3   Title Patient will demonstrate readiness for MBS to determine safety for pleasure feeds by timely initiation of swallow in 3/5 trials with ice chips/H20.   Status Achieved     SLP LONG TERM GOAL #4   Title Patient will adequately masticate and clear soft solids with mod verbal cues and no overt signs of aspiration in 4/5 trials.   Status Deferred     SLP LONG TERM GOAL #5   Title Patient will utilize visual cue for lip closure, dry swallow for secretion management in 75% of opportunities.   Time 3   Period Weeks   Status On-going          Plan - 06/10/16 1914    Clinical Impression Statement Patient is alert and attentive throughout session. Today?Ts session targeted speech only as pt preferred to work on voice today. Good use of slowed rate of speech, requires occasional mod A for amplitude and breath/phonation coordination. Rare min A dry swallows for intelligibility.  Recommend cont' d skilled ST in order to maximize functional communication, swallowing safety, reduce caregiver burden and improve quality of life.   Speech Therapy Frequency 2x / week   Treatment/Interventions Aspiration precaution training;Trials of upgraded texture/liquids;Compensatory strategies;Functional tasks;Patient/family education;Cueing hierarchy;Diet toleration management by SLP;Cognitive reorganization;Compensatory techniques;SLP instruction and feedback;Internal/external aids;Multimodal communcation approach   Potential to Achieve Goals Good   Potential Considerations Ability to learn/carryover information;Co-morbidities;Severity of impairments   SLP Home Exercise Plan Pleasure feeds purees, thin liquids, strict oral care before and after PO, monitor for  signs of fatigue, aspiration   Consulted and Agree with Plan of Care Family member/caregiver;Patient   Family Member Consulted husband chip, caregiver lisa      Patient will benefit from skilled therapeutic intervention in order to improve the following deficits and impairments:   Dysarthria and anarthria    Problem List Patient Active Problem List   Diagnosis Date Noted  . Heterotopic ossification of bone 03/10/2016  . Acute on chronic respiratory failure with hypoxia (HCC)   . Acute hypoxemic respiratory failure (HCC)   . Acute blood loss anemia   . Atelectasis 02/04/2016  . Debility 02/03/2016  . Pneumonia of both lower lobes due to Pseudomonas species (HCC)   . Pneumonia of both lower lobes due to methicillin susceptible Staphylococcus aureus (MSSA) (HCC)   . Acute respiratory failure with hypoxia (HCC)   . Acute encephalopathy   . Seizures (HCC)   . Sepsis (HCC)   . Hypoxia   . Pain   . HCAP (healthcare-associated pneumonia) 12/28/2015  . Chronic respiratory failure (HCC) 12/28/2015  . Acute tracheobronchitis 12/24/2015  . Tracheostomy tube present (HCC)   . Tracheal stenosis   . Chronic respiratory failure  with hypoxia (HCC)   . 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HCC) 10/20/2015  . Tracheostomy status (HCC) 09/26/2015  . Allergic rhinitis 09/26/2015  . 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 Movement disorder 09/22/2015  . Encephalitis 09/22/2015  . Chest pain 02/07/2014  . Premature atrial contractions 01/13/2014  . Abnormality of gait 12/04/2013  . Hyperlipidemia 12/21/2011  . Cardiovascular risk factor 12/21/2011  . Bunion 01/31/2008  . Metatarsalgia of both feet 09/20/2007  . FLAT FOOT 09/20/2007  . HALLUX RIGIDUS, ACQUIRED 09/20/2007   Rondel Baton, MS, CCC-SLP Speech-Language Pathologist   Arlana Lindau 06/10/2016, 7:18 PM  [facility] [address] [place], [place], [address] Phone: [phone]   Fax:  [phone]   Name: Judy Spears MRN: [id number] Date of Birth: May 10, 1950

## 2016-06-10 NOTE — Telephone Encounter (Signed)
Incoming fax received yesterday and today asking for refills on Valacyclovir HCL 500 mg #30...Marland KitchenMarland KitchenMarland Kitchenplease advise....looks like hospital discharge medication....Marland KitchenMarland Kitchen

## 2016-06-11 NOTE — Therapy (Signed)
[facility] [address] [place], [place], [address] Phone: [phone]   Fax:  [phone]  Physical Therapy Treatment  Patient Details  Name: Judy Spears MRN: [id number] Date of Birth: [birth year] Referring Provider: Faith Rogue, MD/Andrew Wynn Banker, MD  Encounter Date: 06/10/2016      PT End of Session - 06/11/16 1556    Visit Number 27   Number of Visits 36   Date for PT Re-Evaluation 06/29/16   Authorization Type BC[employer]   PT Start Time 1446   PT Stop Time 1533   PT Time Calculation (min) 47 min      Past Medical History:  Diagnosis Date  . Arthritis    lt great toe  . Complication of anesthesia    pt has had headaches post op-did advise to hydra well preop  . Hair loss    right-sided  . History of exercise stress test    a. ETT (12/15) with 12:00 exercise, no ST changes, occasional PACs.   . Hyperlipidemia   . Insomnia   . Migraine headache   . Movement disorder    resltess in left legs  . Postmenopausal   . Premature atrial contractions    a. Holter (12/15) with 8% PACs, no atrial runs or atrial fibrillation.     Past Surgical History:  Procedure Laterality Date  . BREAST BIOPSY  01/01/2011   Procedure: BREAST BIOPSY WITH NEEDLE LOCALIZATION;  Surgeon: Emelia Loron, MD;  Location: [facility];  Service: General;  Laterality: Left;  left breast wire localization biopsy  . cataracts right eye    . CESAREAN SECTION  1986  . HERNIA REPAIR  2000   RIH  . opirectinal membrane peel    . RHINOPLASTY  1983    There were no vitals filed for this visit.      Subjective Assessment - 06/11/16 1548    Subjective Caregiver reports pt stood at window at home for approx. 8-10" looking at her rose bushes outside (with CGA)   Patient is accompained by: Family member   Pertinent History Precautions:  PEG.  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  Another round of botox in LLE on 02/06/16   Limitations House hold activities;Walking;Standing;Sitting   Patient Stated Goals Per husband, "For Judy Spears to be as independent as possible."   Currently in Pain? No/denies                         [facility] Adult PT Treatment/Exercise - 06/11/16 0001      Transfers   Transfers Sit to Stand   Sit to Stand 4: Min assist;3: Mod assist   Sit to Stand Details Tactile cues for placement;Verbal cues for technique;Manual facilitation for weight shifting;Verbal cues for gait pattern   Sit to Stand Details (indicate cue type and reason) cues to lean anteriorly   Stand to Sit 4: Min assist;3: Mod assist   Stand to Sit Details (indicate cue type and reason) Verbal cues for technique;Tactile cues for placement   Stand to Sit Details cues to lean anteriorly and to bend at hips     Ambulation/Gait   Ambulation/Gait Yes   Ambulation/Gait Assistance 3: Mod assist;4: Min assist  varies depending on fatigue   Ambulation/Gait Assistance Details cues to "tuck Rt side"  and lean head to Rt side   Ambulation Distance (Feet) 115 Feet   Assistive device Rolling walker  UP walker used   Gait Pattern Decreased stance time - right;Decreased hip/knee flexion - right;Decreased hip/knee flexion - left;Decreased weight shift to right;Scissoring;Ataxic   Ambulation Surface Level;Indoor      NeuroRe-ed;  Pt transferred from wheelchair to standing to tall kneeling on mat with +2 min assist - used [employer] for [employer] - moved RLE out/in for lateral weight shifting and for Rt trunk elongation Pt performed partial squats back onto heels x 5 reps with mod assist - 3 sec hold for stretching  Sidestepping inside // bars 10' x 2 reps with min to mod assist         PT Education - 06/11/16 1554    Education  provided Yes   Education Details recommended to perform anterior/posterior weight shift in standing at home (to caregiver, Judy Spears) - in addition to lateral weight shifting   Person(s) Educated Patient;Caregiver(s)   Methods Explanation;Demonstration   Comprehension Verbalized understanding          PT Short Term Goals - 05/28/16 1522      PT SHORT TERM GOAL #1   Title Pt will consistently perform supine rolling in B directions at S level (with rails as needed) in order to indicate improved trunk dissociation and increaed independence with bed mobility.  (Target Date for STGs: 05/28/16)   Baseline min assist needed per husband's report - 05-24-16   Time 4   Period Weeks   Status On-going     PT SHORT TERM GOAL #2   Title Pt will consistently perform R SL>sit at mod A level in order to indicate increased independence with getting out of bed.     Baseline inconsistently met per husband's report - 05-27-16   Time 4   Period Weeks   Status On-going     PT SHORT TERM GOAL #3   Title Pt will consistently perform L SL>sit at min A level in order to indicate increased independene with getting out of bed/off of exercise mat table.    Baseline mod to min assist needed - 05-27-16   Status Partially Met     PT SHORT TERM GOAL #4   Title Pt will consistently perform sit to SL R and L at mod A level in order to indicate improved independence with bed mobility.     Baseline met 05-28-16   Status Achieved     PT SHORT TERM GOAL #5   Title Pt will perform transfers from w/c to seated surface at min/guard level in order to indicate improved independence with transfers.    Baseline mod assist needed - 05-24-16   Status On-going     PT SHORT TERM GOAL #6   Title Pt will perform static standing with BUE support x 3 mins at S level with no overt LOB to decrease burden of care and increase independence with ADLs.    Baseline Pt able to stand for 3" but amount of assist varies - supervision to mod assist  depending on fatigue - 05-28-16   Time 4   Period Weeks   Status On-going     PT SHORT TERM GOAL #7   Title Pt will perform dynamic sitting at [facility] with intermittent UE support, reaching outside of BOS x 5 mins in order to indicate safety with ADLs.    Baseline met 05-24-16   Status Achieved  PT Long Term Goals - 05/01/16 0953      PT LONG TERM GOAL #1   Title Pt will perform supine rolling R and L at mod I level in order to indicate improved independence with bed mobility.  (Target Date: 06/25/16)   Time 8   Period Weeks   Status New     PT LONG TERM GOAL #2   Title Pt will perform sit>SL R or L at min A level in order to indicate improved independence with getting into bed.    Time 8   Period Weeks   Status New     PT LONG TERM GOAL #3   Title Pt will perform SL R to sit at min A level, SL L to sit at S level in order to indicate increased independence with getting out of bed.     Time 8   Period Weeks   Status New     PT LONG TERM GOAL #4   Title Pt will perform transfers at S level 50% of the time in order to indicate improved functional independence.    Time 8   Period Weeks   Status New     PT LONG TERM GOAL #5   Title Pt will be able to stand with single UE support while performing opposite UE task x 3 mins at S level in order to indicate improved independence with ADLs.    Time 8   Period Weeks   Status New     Additional Long Term Goals   Additional Long Term Goals Yes     PT LONG TERM GOAL #6   Title Pt will demonstrate ability to ambulate up to 25' w/ LRAD at min A level in order to assist with getting around home.     Time 8   Period Weeks   Status New               Plan - 06/11/16 1629    Clinical Impression Statement Pt maneuvered UP walker with only min assist provided initially during start of gait training; pt improving with standing balance and with gait training   Rehab Potential Fair   Clinical Impairments Affecting Rehab  Potential Chronicity of condition, continued medical management, but has very strong family and financial support   PT Frequency 2x / week   PT Duration 8 weeks   PT Treatment/Interventions ADLs/Self Care Home Management;DME Instruction;Gait training;Stair training;Functional mobility training;Therapeutic activities;Patient/family education;Cognitive remediation;Neuromuscular re-education;Therapeutic exercise;Balance training;Orthotic Fit/Training;Wheelchair mobility training;Manual techniques;Splinting;Visual/perceptual remediation/compensation;Vestibular;Passive range of motion;Electrical Stimulation   PT Next Visit Plan gait training; standing balance and transfers, L trunk lengthening, active trunk movement   Consulted and Agree with Plan of Care Patient   Family Member Consulted caregiver, Judy Spears and husband Judy Spears      Patient will benefit from skilled therapeutic intervention in order to improve the following deficits and impairments:  Abnormal gait, Decreased balance, Decreased endurance, Decreased cognition, Cardiopulmonary status limiting activity, Decreased activity tolerance, Decreased coordination, Decreased strength, Impaired flexibility, Impaired tone, Decreased mobility, Increased muscle spasms, Impaired vision/preception, Decreased range of motion, Impaired UE functional use, Postural dysfunction, Improper body mechanics, Impaired perceived functional ability, Decreased knowledge of precautions, Decreased knowledge of use of DME  Visit Diagnosis: Abnormal posture  Other abnormalities of gait and mobility  Muscle weakness (generalized)     Problem List Patient Active Problem List   Diagnosis Date Noted  . Heterotopic ossification of bone 03/10/2016  . Acute on chronic respiratory failure with hypoxia ([place])   .  Acute hypoxemic respiratory failure ([place])   . Acute blood loss anemia   . Atelectasis 02/04/2016  . Debility 02/03/2016  . Pneumonia of both lower lobes due to  Pseudomonas species ([place])   . Pneumonia of both lower lobes due to methicillin susceptible Staphylococcus aureus (MSSA) ([place])   . Acute respiratory failure with hypoxia ([place])   . Acute encephalopathy   . Seizures ([place])   . Sepsis ([place])   . Hypoxia   . Pain   . HCAP (healthcare-associated pneumonia) 12/28/2015  . Chronic respiratory failure ([place]) 12/28/2015  . Acute tracheobronchitis 12/24/2015  . Tracheostomy tube present ([place])   . Tracheal stenosis   . Chronic respiratory failure with hypoxia ([place])   . 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[place]) 10/20/2015  . Tracheostomy status ([place]) 09/26/2015  . Allergic rhinitis 09/26/2015  . 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 Movement disorder 09/22/2015  . Encephalitis 09/22/2015  . Chest pain 02/07/2014  . Premature atrial contractions 01/13/2014  . Abnormality of gait 12/04/2013  . Hyperlipidemia 12/21/2011  . Cardiovascular risk factor 12/21/2011  . Bunion 01/31/2008  . Metatarsalgia of both feet 09/20/2007  . FLAT FOOT 09/20/2007  . HALLUX RIGIDUS, ACQUIRED 09/20/2007    Alda Lea, PT 06/11/2016, 4:35 PM  [facility] [address] [place], [place], [address] Phone: [phone]   Fax:  [phone]  Name: Judy Spears MRN: [id number] Date of Birth: 03-Feb-1950

## 2016-06-15 ENCOUNTER — Ambulatory Visit: Payer: Medicare Other | Admitting: Occupational Therapy

## 2016-06-15 ENCOUNTER — Ambulatory Visit: Payer: Medicare Other | Admitting: Physical Therapy

## 2016-06-15 ENCOUNTER — Ambulatory Visit: Payer: Medicare Other | Admitting: Speech Pathology

## 2016-06-15 ENCOUNTER — Encounter: Payer: Self-pay | Admitting: Occupational Therapy

## 2016-06-15 ENCOUNTER — Other Ambulatory Visit: Payer: Self-pay | Admitting: Physical Medicine & Rehabilitation

## 2016-06-15 DIAGNOSIS — M25622 Stiffness of left elbow, not elsewhere classified: Secondary | ICD-10-CM

## 2016-06-15 DIAGNOSIS — R1312 Dysphagia, oropharyngeal phase: Secondary | ICD-10-CM

## 2016-06-15 DIAGNOSIS — M6281 Muscle weakness (generalized): Secondary | ICD-10-CM

## 2016-06-15 DIAGNOSIS — R2689 Other abnormalities of gait and mobility: Secondary | ICD-10-CM | POA: Diagnosis not present

## 2016-06-15 DIAGNOSIS — M25612 Stiffness of left shoulder, not elsewhere classified: Secondary | ICD-10-CM

## 2016-06-15 DIAGNOSIS — R471 Dysarthria and anarthria: Secondary | ICD-10-CM

## 2016-06-15 DIAGNOSIS — M25611 Stiffness of right shoulder, not elsewhere classified: Secondary | ICD-10-CM

## 2016-06-15 DIAGNOSIS — R29818 Other symptoms and signs involving the nervous system: Secondary | ICD-10-CM

## 2016-06-15 DIAGNOSIS — R293 Abnormal posture: Secondary | ICD-10-CM

## 2016-06-15 DIAGNOSIS — A86 Unspecified viral encephalitis: Secondary | ICD-10-CM

## 2016-06-15 DIAGNOSIS — R41841 Cognitive communication deficit: Secondary | ICD-10-CM

## 2016-06-15 DIAGNOSIS — R278 Other lack of coordination: Secondary | ICD-10-CM

## 2016-06-15 DIAGNOSIS — R2681 Unsteadiness on feet: Secondary | ICD-10-CM

## 2016-06-15 DIAGNOSIS — R41844 Frontal lobe and executive function deficit: Secondary | ICD-10-CM

## 2016-06-15 NOTE — Therapy (Signed)
[facility] [address] [place], [place], [address] Phone: [phone]   Fax:  [phone]  Occupational Therapy Treatment  Patient Details  Name: Judy Spears MRN: [id number] Date of Birth: 1950-11-15 Referring Provider: Dr. Quita Skye. [name]  Encounter Date: 06/15/2016      OT End of Session - 06/15/16 1556    Visit Number 27   Number of Visits 32   Date for OT Re-Evaluation 07/08/16   Authorization Type [employer] unlimited visits   OT Start Time 1402   OT Stop Time 1445   OT Time Calculation (min) 43 min   Activity Tolerance Patient tolerated treatment well      Past Medical History:  Diagnosis Date  . Arthritis    lt great toe  . Complication of anesthesia    pt has had headaches post op-did advise to hydra well preop  . Hair loss    right-sided  . History of exercise stress test    a. ETT (12/15) with 12:00 exercise, no ST changes, occasional PACs.   . Hyperlipidemia   . Insomnia   . Migraine headache   . Movement disorder    resltess in left legs  . Postmenopausal   . Premature atrial contractions    a. Holter (12/15) with 8% PACs, no atrial runs or atrial fibrillation.     Past Surgical History:  Procedure Laterality Date  . BREAST BIOPSY  01/01/2011   Procedure: BREAST BIOPSY WITH NEEDLE LOCALIZATION;  Surgeon: Rolm Bookbinder, MD;  Location: [facility];  Service: General;  Laterality: Left;  left breast wire localization biopsy  . cataracts right eye    . CESAREAN SECTION  1986  . HERNIA REPAIR  2000   RIH  . opirectinal membrane peel    . RHINOPLASTY  1983    There were no vitals filed for this visit.      Subjective Assessment - 06/15/16 1408    Subjective  I am fine (when asked how she is) - vocalized out loud with cues   Patient is accompained by: Family member  husband Judy Spears and caregiver Judy Spears   Pertinent History see epic snapshot - ABI/encephalitis/hypoxia from  virus; recent hospitalization due to ARF;    Patient Stated Goals Pt unable to state.    Currently in Pain? No/denies                      OT Treatments/Exercises (OP) - 06/15/16 0001      ADLs   Functional Mobility Practiced toilet transfers using grab bar - pt needs max, step by step cueing for postural control and weight shifting howvever able to transfer to the left with min a and to the right with mod a.  Pt very slowly improving awarenss of body in space and postural control however needs external max cueing to adjust .       Neurological Re-education Exercises   Other Exercises 1 Neuro re ed to address sit to stand using BUE's, static and dynamic standing balance with bilateral UE support, verbal and tactile cues for postural alignment, orientation and control.  ALso addressed static standing balance with only one UE support while other UE was engaged in functional task with assist.  Also addressed active stand to sit - pt needs mod assist and max cues for this activity.                    OT Short Term  Goals - 06/15/16 1553      OT SHORT TERM GOAL #1   Title Pt, husband and cargiver will be mod I with HEP - 03/29/2016   Status Achieved     OT SHORT TERM GOAL #2   Title Pt will be able to sit with supervision without UE support and vc's to assist with midline 75% while engage in functional task   Status Achieved     OT SHORT TERM GOAL #3   Title Pt will be able to complete sit to stand consistently with mod A for toilet transfers (max a for pivot)   Status Achieved     OT SHORT TERM GOAL #4   Title Pt, husband and caregiver will verbalize understanding of various bathroom accomodations to reduce long term caregiver burden   Status Achieved     OT SHORT TERM GOAL #5   Title Pt will be able to stand step transfer to toilet with mo more than mod a, mod vc's consistently using grab bar for community toileting needs- 06/10/2016 date adjusted as pt missed 2  weeks due to hospitalization   Status Achieved     OT SHORT TERM GOAL #6   Title Pt will be able to wash face with supervision/max cues 50% of the time, min a 50% of the time.    Status Achieved     OT SHORT TERM GOAL #7   Title Pt will be able to don pull over shirt with mod a, max cues   Status Achieved     OT SHORT TERM GOAL #8   Title Pt will be able to maintain static standing with UE support with close supervision to reduce burden of care for caregivers    Status Achieved           OT Long Term Goals - 06/15/16 1553      OT LONG TERM GOAL #1   Title Pt, husband and caregiver will be mod I with upgraded HEP prn - 04/26/2016   Status Achieved     OT LONG TERM GOAL #2   Title Pt, husband and caregiver will be mod I with splint wear and care    Status Achieved     OT LONG TERM GOAL #3   Title Pt will be able to wash face with min a only   Status Achieved     OT LONG TERM GOAL #4   Title Pt will be able to sit without UE support with supevision 100% of the time with no more than 2 vc's while engaged in functional activity.   Status Achieved     OT LONG TERM GOAL #5   Title Pt, husband and caregiver will verbalize understanding of potential after care therapeutic opportunities. - 07/08/2016 date adjusted as pt missed 2 weeks due to hospitalization   Status On-going     OT LONG TERM GOAL #6   Title Pt will be supervision to wash face consistently   Status On-going     OT LONG TERM GOAL #7   Title Pt will don shirt with min a and max vc's 50% of the time   Status On-going     OT LONG TERM GOAL #8   Title Pt will require min a for stand step transfers to toilet using grab bar for community toileting at least 75% of the time.   Status On-going     OT LONG TERM GOAL  #9   Baseline Pt will stand with contact gurard  with only 1 UE support for static standing balance in prep for abiilty to engage in functional task while standing.    Status On-going                Plan - 06/15/16 1554    Clinical Impression Statement Pt continues to demonstrate very slow but steady progress toward goals.  Pt with growing awareness of postural orientation in space however still requires max cues and min-mod a with mobility.    Rehab Potential Fair   Clinical Impairments Affecting Rehab Potential severity of deficits, very slow rate of recovery   OT Frequency 2x / week   OT Duration 8 weeks   OT Treatment/Interventions Self-care/ADL training;Ultrasound;Moist Heat;Electrical Stimulation;DME and/or AE instruction;Neuromuscular education;Therapeutic exercise;Functional Mobility Training;Manual Therapy;Passive range of motion;Splinting;Therapeutic activities;Balance training;Patient/family education;Visual/perceptual remediation/compensation;Cognitive remediation/compensation   Plan NMR for functional use of UE's, static to dynamic standing balance, postural control, balance with only 1 UE support while engaged in functional task with other UE    Consulted and Agree with Plan of Care Patient;Family member/caregiver   Family Member Consulted caregiver Judy Spears , husband Judy Spears      Patient will benefit from skilled therapeutic intervention in order to improve the following deficits and impairments:  Decreased endurance, Decreased skin integrity, Impaired vision/preception, Improper body mechanics, Impaired perceived functional ability, Decreased knowledge of precautions, Cardiopulmonary status limiting activity, Decreased activity tolerance, Decreased knowledge of use of DME, Decreased strength, Impaired flexibility, Improper spinal/pelvic alignment, Impaired sensation, Difficulty walking, Decreased mobility, Decreased balance, Decreased cognition, Decreased range of motion, Impaired tone, Impaired UE functional use, Decreased safety awareness, Decreased coordination, Pain  Visit Diagnosis: Abnormal posture  Muscle weakness (generalized)  Unsteadiness on feet  Other symptoms and  signs involving the nervous system  Stiffness of right shoulder, not elsewhere classified  Stiffness of left shoulder, not elsewhere classified  Stiffness of left elbow, not elsewhere classified  Frontal lobe and executive function deficit  Other lack of coordination    Problem List Patient Active Problem List   Diagnosis Date Noted  . Heterotopic ossification of bone 03/10/2016  . Acute on chronic respiratory failure with hypoxia ([facility])   . Acute hypoxemic respiratory failure ([facility])   . Acute blood loss anemia   . Atelectasis 02/04/2016  . Debility 02/03/2016  . Pneumonia of both lower lobes due to Pseudomonas species ([facility])   . Pneumonia of both lower lobes due to methicillin susceptible Staphylococcus aureus (MSSA) ([facility])   . Acute respiratory failure with hypoxia ([facility])   . Acute encephalopathy   . Seizures ([facility])   . Sepsis ([place])   . Hypoxia   . Pain   . HCAP (healthcare-associated pneumonia) 12/28/2015  . Chronic respiratory failure ([facility]) 12/28/2015  . Acute tracheobronchitis 12/24/2015  . Tracheostomy tube present ([facility])   . Tracheal stenosis   . Chronic respiratory failure with hypoxia ([facility])   . 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 Tracheostomy status ([facility] [facility]) 09/26/2015  . Allergic rhinitis 09/26/2015  . Viral encephalitis 09/22/2015  . Movement disorder 09/22/2015  . Encephalitis 09/22/2015  . Chest pain 02/07/2014  . Premature atrial contractions 01/13/2014  . Abnormality of gait 12/04/2013  . Hyperlipidemia 12/21/2011  . Cardiovascular risk factor 12/21/2011  . Bunion 01/31/2008  . Metatarsalgia of both feet 09/20/2007  . FLAT FOOT 09/20/2007  . HALLUX RIGIDUS, ACQUIRED 09/20/2007    Quay Burow, OTR/L 06/15/2016, 3:58 PM  [facility] [address] [place], [place], [address] Phone: [phone]  Fax:  [phone]  Name: Judy Spears MRN: [id number] Date of Birth: [birth year]

## 2016-06-15 NOTE — Therapy (Signed)
[facility] [address] [place], [place], [address] Phone: [phone]   Fax:  [phone]  Speech Language Pathology Treatment  Patient Details  Name: Judy Spears MRN: [id number] Date of Birth: 1950-07-03 Referring Provider: Charlett Blake, MD  Encounter Date: 06/15/2016      End of Session - 06/15/16 1808    Visit Number 27   Number of Visits 33   Date for SLP Re-Evaluation 06/25/16   SLP Start Time 35   SLP Stop Time  1400   SLP Time Calculation (min) 42 min   Activity Tolerance Patient tolerated treatment well      Past Medical History:  Diagnosis Date  . Arthritis    lt great toe  . Complication of anesthesia    pt has had headaches post op-did advise to hydra well preop  . Hair loss    right-sided  . History of exercise stress test    a. ETT (12/15) with 12:00 exercise, no ST changes, occasional PACs.   . Hyperlipidemia   . Insomnia   . Migraine headache   . Movement disorder    resltess in left legs  . Postmenopausal   . Premature atrial contractions    a. Holter (12/15) with 8% PACs, no atrial runs or atrial fibrillation.     Past Surgical History:  Procedure Laterality Date  . BREAST BIOPSY  01/01/2011   Procedure: BREAST BIOPSY WITH NEEDLE LOCALIZATION;  Surgeon: Rolm Bookbinder, MD;  Location: [facility];  Service: General;  Laterality: Left;  left breast wire localization biopsy  . cataracts right eye    . CESAREAN SECTION  1986  . HERNIA REPAIR  2000   RIH  . opirectinal membrane peel    . RHINOPLASTY  1983    There were no vitals filed for this visit.      Subjective Assessment - 06/15/16 1320    Subjective Pt requires verbal cues, postural adjustment for audible greeting "Fine"   Patient is accompained by: Family member   Special Tests spouse, [name]; caregiver [name]   Currently in Pain? No/denies               ADULT SLP TREATMENT - 06/15/16 1318       General Information   Behavior/Cognition Alert;Cooperative;Pleasant mood   Patient Positioning Upright in chair   Oral care provided Yes     Treatment Provided   Treatment provided Dysphagia;Cognitive-Linquistic     Dysphagia Treatment   Temperature Spikes Noted No   Respiratory Status Room air   Oral Cavity - Dentition Adequate natural dentition   Treatment Methods Skilled observation;Patient/caregiver education;Compensation strategy training;Therapeutic exercise   Patient observed directly with PO's Yes   Type of PO's observed Thin liquids;Dysphagia 1 (puree)   Feeding Able to feed self with adaptive devices;Needs assist   Liquids provided via Cup   Pharyngeal Phase Signs & Symptoms Immediate cough  secretions vs bolus   Type of cueing Verbal;Tactile;Visual   Amount of cueing Moderate   Other treatment/comments SLP performed oral care before and after PO to maximize safety of oral intake. Pt self-feeds cup sips of thin liquids (multiple sips) and with consistent min verbal cue for lip closure, maintains bolus/secretions within oral cavity without spillage. For purees, pt with minimal loss of material coating lips, but she manipulates tongue to retrieve spillage with min verbal cue. Initiates secondary dry swallows in several instances, though she does require occasional min verbal or gestural cue.  Immediate cough x1 noted between PO trials as pt appeared to swallow her saliva; cough today is stronger and she is noted with clear vocal quality after cough.     Pain Assessment   Pain Assessment No/denies pain     Cognitive-Linquistic Treatment   Treatment focused on Dysarthria;Voice;Patient/family/caregiver education   Skilled Treatment SLP cued pt in completing loud /a/ to target breath support and loudness. Pt completed 7/8 trials averaging 70dB and for duration of 2-3 seconds on average, mod A. Consistent mod A for short conversational responses today to increase amplitude or in  some cases to initiate voicing.     Tracheostomy Shiley 4 mm Uncuffed   Tracheostomy Properties Placement Date: 05/08/16 Placement Time: 1030 Placed By: ICU physician Brand: Shiley Size (mm): 4 mm Style: Uncuffed   Status Capped   Site Assessment Clean;Dry   Ties Assessment Clean;Dry;Secure     Assessment / Recommendations / Plan   Plan Continue with current plan of care     Dysphagia Recommendations   Diet recommendations Thin liquid;Dysphagia 1 (puree)   Liquids provided via Teaspoon;Cup   Medication Administration Via alternative means   Supervision Full supervision/cueing for compensatory strategies   Compensations Effortful swallow;Multiple dry swallows after each bite/sip;Check for anterior loss;Small sips/bites;Slow rate   Postural Changes and/or Swallow Maneuvers Seated upright 90 degrees     Progression Toward Goals   Progression toward goals Progressing toward goals          SLP Education - 06/15/16 1807    Education provided Yes   Education Details Continue sustained /a/ phonation in addition to current speech tasks   Methods Explanation;Demonstration   Comprehension Verbalized understanding          SLP Short Term Goals - 06/15/16 1814      SLP SHORT TERM GOAL #1   Title Patient will utilize intelligibility strategies with moderate visual cues from communication partner/visual aid to communicate 5-7 word wants, needs, thoughts and ideas.   Status Achieved     SLP SHORT TERM GOAL #2   Title Patient will utilize intelligibility strategies, breath support with mod A for phrase repetition in 75% of trials.   Status Achieved     SLP SHORT TERM GOAL #3   Title Pt will swallow thin liquids, purees and soft solids in 9/10 trials with no overt signs of aspiration.   Status Achieved     SLP SHORT TERM GOAL #4   Title Patient will tolerate 6oz water, >95% with no overt signs of aspiration.   Status Achieved     SLP SHORT TERM GOAL #5   Title Patient will  utilize verbal cues for lip closure, dry swallow for secretion management in 75% of opportunities.   Status Achieved          SLP Long Term Goals - 06/15/16 1814      SLP LONG TERM GOAL #1   Title Patient will utilize multimodal communication/AAC to clarify wants, needs, thoughts or ideas during communication breakdowns with max A   Status Deferred     SLP LONG TERM GOAL #2   Title Patient will utilize intelligibility strategies, breath support with min A visual cues for 5-7 word responses with 80% accuracy.   Time 2   Period Weeks   Status On-going     SLP LONG TERM GOAL #3   Title Patient will demonstrate readiness for MBS to determine safety for pleasure feeds by timely initiation of swallow in 3/5 trials with ice chips/H20.  Status Achieved     SLP LONG TERM GOAL #4   Title Patient will adequately masticate and clear soft solids with mod verbal cues and no overt signs of aspiration in 4/5 trials.   Status Deferred     SLP LONG TERM GOAL #5   Title Patient will utilize visual cue for lip closure, dry swallow for secretion management in 75% of opportunities.   Time 2   Status On-going          Plan - 06/15/16 1810    Clinical Impression Statement Patient is alert and attentive throughout session. Pt self-feds 8oz thin water, 4 spoonfuls pureed solid (with assistance and adaptive spoon) with no overt signs of aspiration, though she requires occasional mod A for lip closure, dry swallows for clearing oral residue of pureed. Sustained /a/ phonation with increased repetitions, duration.  Recommend cont' d skilled ST in order to maximize functional communication, swallowing safety, reduce caregiver burden and improve quality of life.   Speech Therapy Frequency 2x / week   Treatment/Interventions Aspiration precaution training;Trials of upgraded texture/liquids;Compensatory strategies;Functional tasks;Patient/family education;Cueing hierarchy;Diet toleration management by  SLP;Cognitive reorganization;Compensatory techniques;SLP instruction and feedback;Internal/external aids;Multimodal communcation approach   Potential to Achieve Goals Good   Potential Considerations Ability to learn/carryover information;Co-morbidities;Severity of impairments   SLP Home Exercise Plan Pleasure feeds purees, thin liquids, strict oral care before and after PO, monitor for signs of fatigue, aspiration   Consulted and Agree with Plan of Care Family member/caregiver;Patient   Family Member Consulted husband chip, caregiver lisa      Patient will benefit from skilled therapeutic intervention in order to improve the following deficits and impairments:   Dysphagia, oropharyngeal phase  Dysarthria and anarthria  Cognitive communication deficit    Problem List Patient Active Problem List   Diagnosis Date Noted  . Heterotopic ossification of bone 03/10/2016  . Acute on chronic respiratory failure with hypoxia ([facility] [place])   . Acute hypoxemic respiratory failure ([facility])   . Acute blood loss anemia   . Atelectasis 02/04/2016  . Debility 02/03/2016  . Pneumonia of both lower lobes due to Pseudomonas species ([facility])   . Pneumonia of both lower lobes due to methicillin susceptible Staphylococcus aureus (MSSA) ([facility])   . Acute respiratory failure with hypoxia ([facility])   . Acute encephalopathy   . Seizures ([place])   . Sepsis ([facility])   . Hypoxia   . Pain   . HCAP (healthcare-associated pneumonia) 12/28/2015  . Chronic respiratory failure ([facility]) 12/28/2015  . Acute tracheobronchitis 12/24/2015  . Tracheostomy tube present ([place])   . Tracheal stenosis   . Chronic respiratory failure with hypoxia ([place] [facility])   . 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 Tracheostomy status ([facility]) 09/26/2015  . Allergic rhinitis 09/26/2015  . Viral encephalitis 09/22/2015  . Movement disorder 09/22/2015  . Encephalitis 09/22/2015  . Chest pain 02/07/2014  . Premature atrial contractions 01/13/2014  . Abnormality of gait  12/04/2013  . Hyperlipidemia 12/21/2011  . Cardiovascular risk factor 12/21/2011  . Bunion 01/31/2008  . Metatarsalgia of both feet 09/20/2007  . FLAT FOOT 09/20/2007  . HALLUX RIGIDUS, ACQUIRED 09/20/2007   Deneise Lever, [place], [place] Speech-Language Pathologist  Aliene Altes 06/15/2016, 6:15 PM  [facility] [address] [place], [place], [address] Phone: [phone]   Fax:  [phone]   Name: DREMA EDDINGTON MRN: [id number] Date of Birth: 21-Oct-1950

## 2016-06-16 NOTE — Therapy (Signed)
[facility] [address] [place], [place], [address] Phone: [phone]   Fax:  [phone]  Physical Therapy Treatment  Patient Details  Name: Judy Spears MRN: [id number] Date of Birth: [birth year] Referring Provider: Alger Simons, MD/Judy Letta Pate, MD  Encounter Date: 06/15/2016      PT End of Session - 06/16/16 2209    Visit Number 28   Number of Visits 36   Date for PT Re-Evaluation 06/29/16   Authorization Type BC[employer]   PT Start Time 1447   PT Stop Time 1535   PT Time Calculation (min) 48 min      Past Medical History:  Diagnosis Date  . Arthritis    lt great toe  . Complication of anesthesia    pt has had headaches post op-did advise to hydra well preop  . Hair loss    right-sided  . History of exercise stress test    a. ETT (12/15) with 12:00 exercise, no ST changes, occasional PACs.   . Hyperlipidemia   . Insomnia   . Migraine headache   . Movement disorder    resltess in left legs  . Postmenopausal   . Premature atrial contractions    a. Holter (12/15) with 8% PACs, no atrial runs or atrial fibrillation.     Past Surgical History:  Procedure Laterality Date  . BREAST BIOPSY  01/01/2011   Procedure: BREAST BIOPSY WITH NEEDLE LOCALIZATION;  Surgeon: Judy Bookbinder, MD;  Location: [facility];  Service: General;  Laterality: Left;  left breast wire localization biopsy  . cataracts right eye    . CESAREAN SECTION  1986  . HERNIA REPAIR  2000   RIH  . opirectinal membrane peel    . RHINOPLASTY  1983    There were no vitals filed for this visit.      Subjective Assessment - 06/16/16 2205    Subjective No c/o reported - caregiver reports they are doing fine   Patient is accompained by: Family member   Pertinent History Precautions:  PEG.  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  Another round of botox in LLE on 02/06/16   Limitations House hold activities;Walking;Standing;Sitting   Patient Stated Goals Per husband, "For Judy Spears to be as independent as possible."   Currently in Pain? No/denies                         [facility] Adult PT Treatment/Exercise - 06/16/16 0001      Transfers   Transfers Sit to Stand   Sit to Stand 3: Mod assist   Sit to Stand Details Tactile cues for placement;Verbal cues for technique;Manual facilitation for weight shifting;Verbal cues for gait pattern   Sit to Stand Details (indicate cue type and reason) cues to lean anteriorly   Stand to Sit 3: Mod assist   Stand to Sit Details (indicate cue type and reason) Verbal cues for technique;Tactile cues for placement   Stand to Sit Details tactile cues to flex at hips     Ambulation/Gait   Ambulation/Gait Yes   Ambulation/Gait Assistance 3: Mod assist;4: Min assist  varies depending on fatigue   Ambulation/Gait Assistance Details cues to tuck bottom and keep shoulders upright   Ambulation Distance (Feet) 115 Feet  2 reps   Assistive  device Rolling walker  UP walker used   Gait Pattern Decreased stance time - right;Decreased hip/knee flexion - right;Decreased hip/knee flexion - left;Decreased weight shift to right;Scissoring;Ataxic   Ambulation Surface Level;Indoor     NeuroRe-ed;  Worked on standing balance inside // bars - reaching for cones on Rt side with LUE to facilitate Lt trunk Elongation   TherAct:  Transfer training wheelchair to/from mat 3 reps to each with mod to max assist           PT Short Term Goals - 05/28/16 1522      PT SHORT TERM GOAL #1   Title Pt will consistently perform supine rolling in B directions at S level (with rails as needed) in order to indicate improved trunk dissociation and increaed independence with bed mobility.  (Target Date for  STGs: 05/28/16)   Baseline min assist needed per husband's report - 05-24-16   Time 4   Period Weeks   Status On-going     PT SHORT TERM GOAL #2   Title Pt will consistently perform R SL>sit at mod A level in order to indicate increased independence with getting out of bed.     Baseline inconsistently met per husband's report - 05-27-16   Time 4   Period Weeks   Status On-going     PT SHORT TERM GOAL #3   Title Pt will consistently perform L SL>sit at min A level in order to indicate increased independene with getting out of bed/off of exercise mat table.    Baseline mod to min assist needed - 05-27-16   Status Partially Met     PT SHORT TERM GOAL #4   Title Pt will consistently perform sit to SL R and L at mod A level in order to indicate improved independence with bed mobility.     Baseline met 05-28-16   Status Achieved     PT SHORT TERM GOAL #5   Title Pt will perform transfers from w/c to seated surface at min/guard level in order to indicate improved independence with transfers.    Baseline mod assist needed - 05-24-16   Status On-going     PT SHORT TERM GOAL #6   Title Pt will perform static standing with BUE support x 3 mins at S level with no overt LOB to decrease burden of care and increase independence with ADLs.    Baseline Pt able to stand for 3" but amount of assist varies - supervision to mod assist depending on fatigue - 05-28-16   Time 4   Period Weeks   Status On-going     PT SHORT TERM GOAL #7   Title Pt will perform dynamic sitting at [facility] with intermittent UE support, reaching outside of BOS x 5 mins in order to indicate safety with ADLs.    Baseline met 05-24-16   Status Achieved           PT Long Term Goals - 05/01/16 0953      PT LONG TERM GOAL #1   Title Pt will perform supine rolling R and L at mod I level in order to indicate improved independence with bed mobility.  (Target Date: 06/25/16)   Time 8   Period Weeks   Status New     PT LONG TERM GOAL #2    Title Pt will perform sit>SL R or L at min A level in order to indicate improved independence with getting into bed.    Time 8  Period Weeks   Status New     PT LONG TERM GOAL #3   Title Pt will perform SL R to sit at min A level, SL L to sit at S level in order to indicate increased independence with getting out of bed.     Time 8   Period Weeks   Status New     PT LONG TERM GOAL #4   Title Pt will perform transfers at S level 50% of the time in order to indicate improved functional independence.    Time 8   Period Weeks   Status New     PT LONG TERM GOAL #5   Title Pt will be able to stand with single UE support while performing opposite UE task x 3 mins at S level in order to indicate improved independence with ADLs.    Time 8   Period Weeks   Status New     Additional Long Term Goals   Additional Long Term Goals Yes     PT LONG TERM GOAL #6   Title Pt will demonstrate ability to ambulate up to 25' w/ LRAD at min A level in order to assist with getting around home.     Time 8   Period Weeks   Status New               Plan - 06/16/16 2210    Clinical Impression Statement 2nd rep of amb was more improved than 1st rep of amb. with pt holding herself more erect than she was able to do on first rep;  pt required mod to max assist to transfer from wheelchair to/from mat with verbal and tacitle cues needed for hand placement   Rehab Potential Fair   Clinical Impairments Affecting Rehab Potential Chronicity of condition, continued medical management, but has very strong family and financial support   PT Frequency 2x / week   PT Duration 8 weeks   PT Treatment/Interventions ADLs/Self Care Home Management;DME Instruction;Gait training;Stair training;Functional mobility training;Therapeutic activities;Patient/family education;Cognitive remediation;Neuromuscular re-education;Therapeutic exercise;Balance training;Orthotic Fit/Training;Wheelchair mobility training;Manual  techniques;Splinting;Visual/perceptual remediation/compensation;Vestibular;Passive range of motion;Electrical Stimulation   PT Next Visit Plan gait training; standing balance and transfers, L trunk lengthening, active trunk movement   Consulted and Agree with Plan of Care Patient   Family Member Consulted caregiver, [name] and husband [name]      Patient will benefit from skilled therapeutic intervention in order to improve the following deficits and impairments:  Abnormal gait, Decreased balance, Decreased endurance, Decreased cognition, Cardiopulmonary status limiting activity, Decreased activity tolerance, Decreased coordination, Decreased strength, Impaired flexibility, Impaired tone, Decreased mobility, Increased muscle spasms, Impaired vision/preception, Decreased range of motion, Impaired UE functional use, Postural dysfunction, Improper body mechanics, Impaired perceived functional ability, Decreased knowledge of precautions, Decreased knowledge of use of DME  Visit Diagnosis: Other abnormalities of gait and mobility  Abnormal posture     Problem List Patient Active Problem List   Diagnosis Date Noted  . Heterotopic ossification of bone 03/10/2016  . Acute on chronic respiratory failure with hypoxia ([employer])   . Acute hypoxemic respiratory failure ([place])   . Acute blood loss anemia   . Atelectasis 02/04/2016  . Debility 02/03/2016  . Pneumonia of both lower lobes due to Pseudomonas species ([place])   . Pneumonia of both lower lobes due to methicillin susceptible Staphylococcus aureus (MSSA) ([place])   . Acute respiratory failure with hypoxia ([place])   . Acute encephalopathy   . Seizures ([place])   . Sepsis ([place])   .  Hypoxia   . Pain   . HCAP (healthcare-associated pneumonia) 12/28/2015  . Chronic respiratory failure ([place]) 12/28/2015  . Acute tracheobronchitis 12/24/2015  . Tracheostomy tube present ([place])   . Tracheal stenosis   . Chronic respiratory failure with hypoxia ([place])   . 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 Tracheostomy status ([place]) 09/26/2015  . Allergic rhinitis 09/26/2015  . Viral encephalitis 09/22/2015  . Movement disorder 09/22/2015  . Encephalitis 09/22/2015  . Chest pain 02/07/2014  . Premature atrial contractions 01/13/2014  . Abnormality of gait 12/04/2013  . Hyperlipidemia 12/21/2011  . Cardiovascular risk factor 12/21/2011  . Bunion 01/31/2008  . Metatarsalgia of both feet 09/20/2007  . FLAT FOOT 09/20/2007  . HALLUX RIGIDUS, ACQUIRED 09/20/2007    Alda Lea, PT 06/16/2016, 10:16 PM  [facility] [address] [place], [place], [address] Phone: [phone]   Fax:  [phone]  Name: Judy Spears MRN: [id number] Date of Birth: 29-Oct-1950

## 2016-06-17 ENCOUNTER — Ambulatory Visit: Payer: Medicare Other | Admitting: Occupational Therapy

## 2016-06-17 ENCOUNTER — Ambulatory Visit: Payer: Medicare Other | Admitting: Physical Therapy

## 2016-06-17 ENCOUNTER — Ambulatory Visit: Payer: Medicare Other | Admitting: Speech Pathology

## 2016-06-17 DIAGNOSIS — R278 Other lack of coordination: Secondary | ICD-10-CM

## 2016-06-17 DIAGNOSIS — R41844 Frontal lobe and executive function deficit: Secondary | ICD-10-CM

## 2016-06-17 DIAGNOSIS — R471 Dysarthria and anarthria: Secondary | ICD-10-CM

## 2016-06-17 DIAGNOSIS — R29818 Other symptoms and signs involving the nervous system: Secondary | ICD-10-CM

## 2016-06-17 DIAGNOSIS — M25611 Stiffness of right shoulder, not elsewhere classified: Secondary | ICD-10-CM

## 2016-06-17 DIAGNOSIS — R2681 Unsteadiness on feet: Secondary | ICD-10-CM

## 2016-06-17 DIAGNOSIS — M25622 Stiffness of left elbow, not elsewhere classified: Secondary | ICD-10-CM

## 2016-06-17 DIAGNOSIS — R2689 Other abnormalities of gait and mobility: Secondary | ICD-10-CM

## 2016-06-17 DIAGNOSIS — M6281 Muscle weakness (generalized): Secondary | ICD-10-CM

## 2016-06-17 DIAGNOSIS — R1312 Dysphagia, oropharyngeal phase: Secondary | ICD-10-CM

## 2016-06-17 DIAGNOSIS — M25612 Stiffness of left shoulder, not elsewhere classified: Secondary | ICD-10-CM

## 2016-06-17 DIAGNOSIS — R293 Abnormal posture: Secondary | ICD-10-CM

## 2016-06-17 NOTE — Therapy (Signed)
[facility] [address] [place], [place], [address] Phone: [phone]   Fax:  [phone]  Speech Language Pathology Treatment  Patient Details  Name: Judy Spears MRN: [id number] Date of Birth: 05-05-1950 Referring Provider: Charlett Blake, MD  Encounter Date: 06/17/2016      End of Session - 06/17/16 1808    Visit Number 28   Number of Visits 33   Date for SLP Re-Evaluation 06/25/16   SLP Start Time 1533   SLP Stop Time  1618   SLP Time Calculation (min) 45 min   Activity Tolerance Patient limited by fatigue      Past Medical History:  Diagnosis Date  . Arthritis    lt great toe  . Complication of anesthesia    pt has had headaches post op-did advise to hydra well preop  . Hair loss    right-sided  . History of exercise stress test    a. ETT (12/15) with 12:00 exercise, no ST changes, occasional PACs.   . Hyperlipidemia   . Insomnia   . Migraine headache   . Movement disorder    resltess in left legs  . Postmenopausal   . Premature atrial contractions    a. Holter (12/15) with 8% PACs, no atrial runs or atrial fibrillation.     Past Surgical History:  Procedure Laterality Date  . BREAST BIOPSY  01/01/2011   Procedure: BREAST BIOPSY WITH NEEDLE LOCALIZATION;  Surgeon: Rolm Bookbinder, MD;  Location: [facility];  Service: General;  Laterality: Left;  left breast wire localization biopsy  . cataracts right eye    . CESAREAN SECTION  1986  . HERNIA REPAIR  2000   RIH  . opirectinal membrane peel    . RHINOPLASTY  1983    There were no vitals filed for this visit.      Subjective Assessment - 06/17/16 1746    Subjective Pt ambulated with walker and assistance to today's ST session, sitting upright in chair today.    Patient is accompained by: Family member   Special Tests spouse, [name]; caregiver [name]   Currently in Pain? No/denies               ADULT SLP  TREATMENT - 06/17/16 1533      General Information   Behavior/Cognition Alert;Cooperative;Pleasant mood   Patient Positioning Upright in chair   Oral care provided Yes     Treatment Provided   Treatment provided Dysphagia;Cognitive-Linquistic     Dysphagia Treatment   Temperature Spikes Noted No   Respiratory Status Room air   Oral Cavity - Dentition Adequate natural dentition   Treatment Methods Skilled observation;Patient/caregiver education;Compensation strategy training;Therapeutic exercise   Patient observed directly with PO's Yes   Type of PO's observed Thin liquids;Dysphagia 3 (soft)   Feeding Able to feed self with adaptive devices;Needs assist   Liquids provided via Cup  pt's adaptive cup   Oral Phase Signs & Symptoms Anterior loss/spillage   Pharyngeal Phase Signs & Symptoms Immediate cough   Type of cueing Verbal;Tactile;Visual   Amount of cueing Minimal   Other treatment/comments SLP performed oral care before and after PO to maximize safety of oral intake. Pt self-feeds cup sips of thin liquids. Immediate cough x1 with initial, multiple sips. With instruction for single sips, no overt signs of aspiration. Pt with anterior spillage which improves with verbal cues for complete lip closure/seal against cup. Completing secondary swallows today with occasional min A. Mechanical  soft solids trials with min-mod A for bolus retrieval. She requires extended time for mastication, completes multiple swallows per bite to clear oral residue with consistent min A; airway protection appears adequate. At session's end, pt reached for her cup as caregiver began moving her toward the exit, pt took large sip with significant anterior spillage, immediate cough, suggestive of reduced airway protection. Provided education re: slow rate, minimizing distractions during intake.      Pain Assessment   Pain Assessment No/denies pain     Cognitive-Linquistic Treatment   Treatment focused on  Dysarthria;Voice;Patient/family/caregiver education   Skilled Treatment SLP cued pt in completing loud /a/ to target breath support and loudness. Pt averaging mid 70s dB and for duration of 3-4 seconds on average, rare min A. Occasional min A for short conversational responses today; question whether improvements in voicing are posture-related (chair vs wheelchair). With occasional cues for vocal amplitude, slowed rate, pt told SLP her birthday plans in short sentences.      Tracheostomy Shiley 4 mm Uncuffed   Tracheostomy Properties Placement Date: 05/08/16 Placement Time: 1030 Placed By: ICU physician Brand: Shiley Size (mm): 4 mm Style: Uncuffed   Status Capped   Site Assessment Clean;Dry   Ties Assessment Clean;Dry;Secure     Assessment / Recommendations / Plan   Plan Continue with current plan of care     Dysphagia Recommendations   Diet recommendations Dysphagia 1 (puree);Thin liquid   Liquids provided via Teaspoon;Cup   Medication Administration Via alternative means   Supervision Full supervision/cueing for compensatory strategies   Compensations Effortful swallow;Multiple dry swallows after each bite/sip;Check for anterior loss;Small sips/bites;Slow rate  Minimize distractions   Postural Changes and/or Swallow Maneuvers Seated upright 90 degrees     Progression Toward Goals   Progression toward goals Progressing toward goals          SLP Education - 06/17/16 1807    Education provided Yes   Education Details Continue with foods of uniform texture (purees, smoothies, soups), multiple swallows and ensure oral cavity is clear between bites   Person(s) Educated Patient;Caregiver(s);Spouse   Methods Explanation;Demonstration;Verbal cues   Comprehension Verbalized understanding          SLP Short Term Goals - 06/17/16 1811      SLP SHORT TERM GOAL #1   Title Patient will utilize intelligibility strategies with moderate visual cues from communication partner/visual aid to  communicate 5-7 word wants, needs, thoughts and ideas.   Status Achieved     SLP SHORT TERM GOAL #2   Title Patient will utilize intelligibility strategies, breath support with mod A for phrase repetition in 75% of trials.   Status Achieved     SLP SHORT TERM GOAL #3   Title Pt will swallow thin liquids, purees and soft solids in 9/10 trials with no overt signs of aspiration.   Status Achieved     SLP SHORT TERM GOAL #4   Title Patient will tolerate 6oz water, >95% with no overt signs of aspiration.   Status Achieved     SLP SHORT TERM GOAL #5   Title Patient will utilize verbal cues for lip closure, dry swallow for secretion management in 75% of opportunities.   Status Achieved          SLP Long Term Goals - 06/17/16 1814      SLP LONG TERM GOAL #1   Title Patient will utilize multimodal communication/AAC to clarify wants, needs, thoughts or ideas during communication breakdowns with max A  Status Deferred     SLP LONG TERM GOAL #2   Title Patient will utilize intelligibility strategies, breath support with min A visual cues for 5-7 word responses with 80% accuracy.   Baseline 06/17/16   Time 2   Period Weeks   Status On-going     SLP LONG TERM GOAL #3   Title Patient will demonstrate readiness for MBS to determine safety for pleasure feeds by timely initiation of swallow in 3/5 trials with ice chips/H20.   Status Achieved     SLP LONG TERM GOAL #4   Title Patient will adequately masticate and clear soft solids with mod verbal cues and no overt signs of aspiration in 4/5 trials.   Time 2   Period Weeks   Status On-going     SLP LONG TERM GOAL #5   Title Patient will utilize visual cue for lip closure, dry swallow for secretion management in 75% of opportunities.   Baseline 5.7.18, 06/07/16   Time 2   Period Weeks   Status On-going          Plan - 06/17/16 1811    Clinical Impression Statement mechanical soft solid (with assistance and adaptive cup) requires  occasional mod A for lip closure, consistent min A for dry swallows oral residue of soft solid. Sustained /a/ phonation with increased amplitude, duration.  Recommend cont' d skilled ST in order to maximize functional communication, swallowing safety, reduce caregiver burden and improve quality of life.   Speech Therapy Frequency 2x / week   Treatment/Interventions Aspiration precaution training;Trials of upgraded texture/liquids;Compensatory strategies;Functional tasks;Patient/family education;Cueing hierarchy;Diet toleration management by SLP;Cognitive reorganization;Compensatory techniques;SLP instruction and feedback;Internal/external aids;Multimodal communcation approach   Potential to Achieve Goals Good   Potential Considerations Ability to learn/carryover information;Co-morbidities;Severity of impairments   SLP Home Exercise Plan Pleasure feeds purees, thin liquids, strict oral care before and after PO, monitor for signs of fatigue, aspiration   Consulted and Agree with Plan of Care Family member/caregiver;Patient   Family Member Consulted husband chip, caregiver lisa      Patient will benefit from skilled therapeutic intervention in order to improve the following deficits and impairments:   Dysarthria and anarthria  Dysphagia, oropharyngeal phase    Problem List Patient Active Problem List   Diagnosis Date Noted  . Heterotopic ossification of bone 03/10/2016  . Acute on chronic respiratory failure with hypoxia ([place])   . Acute hypoxemic respiratory failure ([place])   . Acute blood loss anemia   . Atelectasis 02/04/2016  . Debility 02/03/2016  . Pneumonia of both lower lobes due to Pseudomonas species ([place])   . Pneumonia of both lower lobes due to methicillin susceptible Staphylococcus aureus (MSSA) ([place])   . Acute respiratory failure with hypoxia ([place])   . Acute encephalopathy   . Seizures ([place])   . Sepsis ([place])   . Hypoxia   . Pain   . HCAP (healthcare-associated pneumonia)  12/28/2015  . Chronic respiratory failure ([place]) 12/28/2015  . Acute tracheobronchitis 12/24/2015  . Tracheostomy tube present ([place])   . Tracheal stenosis   . Chronic respiratory failure with hypoxia ([place])   . 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[place]) 10/20/2015  . Tracheostomy status ([place]) 09/26/2015  . Allergic rhinitis 09/26/2015  . Viral encephalitis 09/22/2015  . Movement disorder 09/22/2015  . Encephalitis 09/22/2015  . Chest pain 02/07/2014  . Premature atrial contractions 01/13/2014  . Abnormality of gait 12/04/2013  . Hyperlipidemia 12/21/2011  . Cardiovascular risk factor 12/21/2011  . Bunion 01/31/2008  . Metatarsalgia of  both feet 09/20/2007  . FLAT FOOT 09/20/2007  . HALLUX RIGIDUS, ACQUIRED 09/20/2007   Deneise Lever, [place], [place] Speech-Language Pathologist  Aliene Altes 06/17/2016, 6:16 PM  [facility] [address] [place], [place], [address] Phone: [phone]   Fax:  [phone]   Name: Judy Spears MRN: [id number] Date of Birth: 07/14/1950

## 2016-06-17 NOTE — Therapy (Signed)
[facility] [address] [place], [place], [address] Phone: [phone]   Fax:  [phone]  Speech Language Pathology Treatment  Patient Details  Name: Judy Spears MRN: [id number] Date of Birth: 10-Jul-1950 Referring Provider: Charlett Blake, MD  Encounter Date: 06/17/2016      End of Session - 06/17/16 1808    Visit Number 28   Number of Visits 33   Date for SLP Re-Evaluation 06/25/16   SLP Start Time 1533   SLP Stop Time  1618   SLP Time Calculation (min) 45 min   Activity Tolerance Patient limited by fatigue      Past Medical History:  Diagnosis Date  . Arthritis    lt great toe  . Complication of anesthesia    pt has had headaches post op-did advise to hydra well preop  . Hair loss    right-sided  . History of exercise stress test    a. ETT (12/15) with 12:00 exercise, no ST changes, occasional PACs.   . Hyperlipidemia   . Insomnia   . Migraine headache   . Movement disorder    resltess in left legs  . Postmenopausal   . Premature atrial contractions    a. Holter (12/15) with 8% PACs, no atrial runs or atrial fibrillation.     Past Surgical History:  Procedure Laterality Date  . BREAST BIOPSY  01/01/2011   Procedure: BREAST BIOPSY WITH NEEDLE LOCALIZATION;  Surgeon: Rolm Bookbinder, MD;  Location: [facility];  Service: General;  Laterality: Left;  left breast wire localization biopsy  . cataracts right eye    . CESAREAN SECTION  1986  . HERNIA REPAIR  2000   RIH  . opirectinal membrane peel    . RHINOPLASTY  1983    There were no vitals filed for this visit.      Subjective Assessment - 06/17/16 1746    Subjective Pt ambulated with walker and assistance to today's ST session, sitting upright in chair today.    Patient is accompained by: Family member   Special Tests spouse, [name]; caregiver [name]   Currently in Pain? No/denies               ADULT SLP  TREATMENT - 06/17/16 1533      General Information   Behavior/Cognition Alert;Cooperative;Pleasant mood   Patient Positioning Upright in chair   Oral care provided Yes     Treatment Provided   Treatment provided Dysphagia;Cognitive-Linquistic     Dysphagia Treatment   Temperature Spikes Noted No   Respiratory Status Room air   Oral Cavity - Dentition Adequate natural dentition   Treatment Methods Skilled observation;Patient/caregiver education;Compensation strategy training;Therapeutic exercise   Patient observed directly with PO's Yes   Type of PO's observed Thin liquids;Dysphagia 3 (soft)   Feeding Able to feed self with adaptive devices;Needs assist   Liquids provided via Cup  pt's adaptive cup   Oral Phase Signs & Symptoms Anterior loss/spillage   Pharyngeal Phase Signs & Symptoms Immediate cough   Type of cueing Verbal;Tactile;Visual   Amount of cueing Minimal   Other treatment/comments SLP performed oral care before and after PO to maximize safety of oral intake. Pt self-feeds cup sips of thin liquids. Immediate cough x1 with initial, multiple sips. With instruction for single sips, no overt signs of aspiration. Pt with anterior spillage which improves with verbal cues for complete lip closure/seal against cup. Completing secondary swallows today with occasional min A. Mechanical  soft solids trials with min-mod A for bolus retrieval. She requires extended time for mastication, completes multiple swallows per bite to clear oral residue with consistent min A; airway protection appears adequate. At session's end, pt reached for her cup as caregiver began moving her toward the exit, pt took large sip with significant anterior spillage, immediate cough, suggestive of reduced airway protection. Provided education re: slow rate, minimizing distractions during intake.      Pain Assessment   Pain Assessment No/denies pain     Cognitive-Linquistic Treatment   Treatment focused on  Dysarthria;Voice;Patient/family/caregiver education   Skilled Treatment SLP cued pt in completing loud /a/ to target breath support and loudness. Pt averaging mid 70s dB and for duration of 3-4 seconds on average, rare min A. Occasional min A for short conversational responses today; question whether improvements in voicing are posture-related (chair vs wheelchair). With occasional cues for vocal amplitude, slowed rate, pt told SLP her birthday plans in short sentences.      Tracheostomy Shiley 4 mm Uncuffed   Tracheostomy Properties Placement Date: 05/08/16 Placement Time: 1030 Placed By: ICU physician Brand: Shiley Size (mm): 4 mm Style: Uncuffed   Status Capped   Site Assessment Clean;Dry   Ties Assessment Clean;Dry;Secure     Assessment / Recommendations / Plan   Plan Continue with current plan of care     Dysphagia Recommendations   Diet recommendations Dysphagia 1 (puree);Thin liquid   Liquids provided via Teaspoon;Cup   Medication Administration Via alternative means   Supervision Full supervision/cueing for compensatory strategies   Compensations Effortful swallow;Multiple dry swallows after each bite/sip;Check for anterior loss;Small sips/bites;Slow rate  Minimize distractions   Postural Changes and/or Swallow Maneuvers Seated upright 90 degrees     Progression Toward Goals   Progression toward goals Progressing toward goals          SLP Education - 06/17/16 1807    Education provided Yes   Education Details Continue with foods of uniform texture (purees, smoothies, soups), multiple swallows and ensure oral cavity is clear between bites   Person(s) Educated Patient;Caregiver(s);Spouse   Methods Explanation;Demonstration;Verbal cues   Comprehension Verbalized understanding          SLP Short Term Goals - 06/17/16 1811      SLP SHORT TERM GOAL #1   Title Patient will utilize intelligibility strategies with moderate visual cues from communication partner/visual aid to  communicate 5-7 word wants, needs, thoughts and ideas.   Status Achieved     SLP SHORT TERM GOAL #2   Title Patient will utilize intelligibility strategies, breath support with mod A for phrase repetition in 75% of trials.   Status Achieved     SLP SHORT TERM GOAL #3   Title Pt will swallow thin liquids, purees and soft solids in 9/10 trials with no overt signs of aspiration.   Status Achieved     SLP SHORT TERM GOAL #4   Title Patient will tolerate 6oz water, >95% with no overt signs of aspiration.   Status Achieved     SLP SHORT TERM GOAL #5   Title Patient will utilize verbal cues for lip closure, dry swallow for secretion management in 75% of opportunities.   Status Achieved          SLP Long Term Goals - 06/17/16 1814      SLP LONG TERM GOAL #1   Title Patient will utilize multimodal communication/AAC to clarify wants, needs, thoughts or ideas during communication breakdowns with max A  Status Deferred     SLP LONG TERM GOAL #2   Title Patient will utilize intelligibility strategies, breath support with min A visual cues for 5-7 word responses with 80% accuracy.   Baseline 06/17/16   Time 2   Period Weeks   Status On-going     SLP LONG TERM GOAL #3   Title Patient will demonstrate readiness for MBS to determine safety for pleasure feeds by timely initiation of swallow in 3/5 trials with ice chips/H20.   Status Achieved     SLP LONG TERM GOAL #4   Title Patient will adequately masticate and clear soft solids with mod verbal cues and no overt signs of aspiration in 4/5 trials.   Time 2   Period Weeks   Status On-going     SLP LONG TERM GOAL #5   Title Patient will utilize visual cue for lip closure, dry swallow for secretion management in 75% of opportunities.   Baseline 5.7.18, 06/07/16   Time 2   Period Weeks   Status On-going          Plan - 06/17/16 1818    Clinical Impression Statement Patient is alert and attentive throughout session. Pt self-feds 6oz  thin water, 5 bites of mechanical soft solid (with assistance and adaptive cup) requires occasional mod A for lip closure, consistent min A for dry swallows oral residue of soft solid. Sustained /a/ phonation with increased amplitude, duration.  Recommend cont' d skilled ST in order to maximize functional communication, swallowing safety, reduce caregiver burden and improve quality of life.      Patient will benefit from skilled therapeutic intervention in order to improve the following deficits and impairments:   Dysarthria and anarthria  Dysphagia, oropharyngeal phase    Problem List Patient Active Problem List   Diagnosis Date Noted  . Heterotopic ossification of bone 03/10/2016  . Acute on chronic respiratory failure with hypoxia ([facility])   . Acute hypoxemic respiratory failure ([facility])   . Acute blood loss anemia   . Atelectasis 02/04/2016  . Debility 02/03/2016  . Pneumonia of both lower lobes due to Pseudomonas species ([facility])   . Pneumonia of both lower lobes due to methicillin susceptible Staphylococcus aureus (MSSA) ([facility])   . Acute respiratory failure with hypoxia ([facility])   . Acute encephalopathy   . Seizures ([facility])   . Sepsis ([facility])   . Hypoxia   . Pain   . HCAP (healthcare-associated pneumonia) 12/28/2015  . Chronic respiratory failure ([facility]) 12/28/2015  . Acute tracheobronchitis 12/24/2015  . Tracheostomy tube present ([facility])   . Tracheal stenosis   . Chronic respiratory failure with hypoxia ([facility])   . 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 Tracheostomy status ([facility]) 09/26/2015  . Allergic rhinitis 09/26/2015  . Viral encephalitis 09/22/2015  . Movement disorder 09/22/2015  . Encephalitis 09/22/2015  . Chest pain 02/07/2014  . Premature atrial contractions 01/13/2014  . Abnormality of gait 12/04/2013  . Hyperlipidemia 12/21/2011  . Cardiovascular risk factor 12/21/2011  . Bunion 01/31/2008  . Metatarsalgia of both feet 09/20/2007  . FLAT FOOT 09/20/2007  . HALLUX RIGIDUS,  ACQUIRED 09/20/2007   Deneise Lever, [place], [place] Speech-Language Pathologist   Aliene Altes 06/17/2016, 6:19 PM  [facility] [address] [place], [place], [address] Phone: [phone]   Fax:  [phone]   Name: Judy Spears MRN: [id number] Date of Birth: 1950/03/30

## 2016-06-17 NOTE — Therapy (Signed)
[facility] [address] [place], [place], [address] Phone: [phone]   Fax:  [phone]  Speech Language Pathology Treatment  Patient Details  Name: Judy Spears MRN: [id number] Date of Birth: 08/18/1950 Referring Provider: Charlett Blake, MD  Encounter Date: 06/17/2016      End of Session - 06/17/16 1808    Visit Number 28   Number of Visits 33   Date for SLP Re-Evaluation 06/25/16   SLP Start Time 1533   SLP Stop Time  1618   SLP Time Calculation (min) 45 min   Activity Tolerance Patient limited by fatigue      Past Medical History:  Diagnosis Date  . Arthritis    lt great toe  . Complication of anesthesia    pt has had headaches post op-did advise to hydra well preop  . Hair loss    right-sided  . History of exercise stress test    a. ETT (12/15) with 12:00 exercise, no ST changes, occasional PACs.   . Hyperlipidemia   . Insomnia   . Migraine headache   . Movement disorder    resltess in left legs  . Postmenopausal   . Premature atrial contractions    a. Holter (12/15) with 8% PACs, no atrial runs or atrial fibrillation.     Past Surgical History:  Procedure Laterality Date  . BREAST BIOPSY  01/01/2011   Procedure: BREAST BIOPSY WITH NEEDLE LOCALIZATION;  Surgeon: Rolm Bookbinder, MD;  Location: [facility];  Service: General;  Laterality: Left;  left breast wire localization biopsy  . cataracts right eye    . CESAREAN SECTION  1986  . HERNIA REPAIR  2000   RIH  . opirectinal membrane peel    . RHINOPLASTY  1983    There were no vitals filed for this visit.      Subjective Assessment - 06/17/16 1746    Subjective Pt ambulated with walker and assistance to today's ST session, sitting upright in chair today.    Patient is accompained by: Family member   Special Tests spouse, [name]; caregiver [name]   Currently in Pain? No/denies               ADULT SLP  TREATMENT - 06/17/16 1533      General Information   Behavior/Cognition Alert;Cooperative;Pleasant mood   Patient Positioning Upright in chair   Oral care provided Yes     Treatment Provided   Treatment provided Dysphagia;Cognitive-Linquistic     Dysphagia Treatment   Temperature Spikes Noted No   Respiratory Status Room air   Oral Cavity - Dentition Adequate natural dentition   Treatment Methods Skilled observation;Patient/caregiver education;Compensation strategy training;Therapeutic exercise   Patient observed directly with PO's Yes   Type of PO's observed Thin liquids;Dysphagia 3 (soft)   Feeding Able to feed self with adaptive devices;Needs assist   Liquids provided via Cup  pt's adaptive cup   Oral Phase Signs & Symptoms Anterior loss/spillage   Pharyngeal Phase Signs & Symptoms Immediate cough   Type of cueing Verbal;Tactile;Visual   Amount of cueing Minimal   Other treatment/comments SLP performed oral care before and after PO to maximize safety of oral intake. Pt self-feeds cup sips of thin liquids. Immediate cough x1 with initial, multiple sips. With instruction for single sips, no overt signs of aspiration. Pt with anterior spillage which improves with verbal cues for complete lip closure/seal against cup. Completing secondary swallows today with occasional min A. Mechanical  soft solids trials with min-mod A for bolus retrieval. She requires extended time for mastication, completes multiple swallows per bite to clear oral residue with consistent min A; airway protection appears adequate. At session's end, pt reached for her cup as caregiver began moving her toward the exit, pt took large sip with significant anterior spillage, immediate cough, suggestive of reduced airway protection. Provided education re: slow rate, minimizing distractions during intake.      Pain Assessment   Pain Assessment No/denies pain     Cognitive-Linquistic Treatment   Treatment focused on  Dysarthria;Voice;Patient/family/caregiver education   Skilled Treatment SLP cued pt in completing loud /a/ to target breath support and loudness. Pt averaging mid 70s dB and for duration of 3-4 seconds on average, rare min A. Occasional min A for short conversational responses today; question whether improvements in voicing are posture-related (chair vs wheelchair). With occasional cues for vocal amplitude, slowed rate, pt told SLP her birthday plans in short sentences.      Tracheostomy Shiley 4 mm Uncuffed   Tracheostomy Properties Placement Date: 05/08/16 Placement Time: 1030 Placed By: ICU physician Brand: Shiley Size (mm): 4 mm Style: Uncuffed   Status Capped   Site Assessment Clean;Dry   Ties Assessment Clean;Dry;Secure     Assessment / Recommendations / Plan   Plan Continue with current plan of care     Dysphagia Recommendations   Diet recommendations Dysphagia 1 (puree);Thin liquid   Liquids provided via Teaspoon;Cup   Medication Administration Via alternative means   Supervision Full supervision/cueing for compensatory strategies   Compensations Effortful swallow;Multiple dry swallows after each bite/sip;Check for anterior loss;Small sips/bites;Slow rate  Minimize distractions   Postural Changes and/or Swallow Maneuvers Seated upright 90 degrees     Progression Toward Goals   Progression toward goals Progressing toward goals          SLP Education - 06/17/16 1807    Education provided Yes   Education Details Continue with foods of uniform texture (purees, smoothies, soups), multiple swallows and ensure oral cavity is clear between bites   Person(s) Educated Patient;Caregiver(s);Spouse   Methods Explanation;Demonstration;Verbal cues   Comprehension Verbalized understanding          SLP Short Term Goals - 06/17/16 1811      SLP SHORT TERM GOAL #1   Title Patient will utilize intelligibility strategies with moderate visual cues from communication partner/visual aid to  communicate 5-7 word wants, needs, thoughts and ideas.   Status Achieved     SLP SHORT TERM GOAL #2   Title Patient will utilize intelligibility strategies, breath support with mod A for phrase repetition in 75% of trials.   Status Achieved     SLP SHORT TERM GOAL #3   Title Pt will swallow thin liquids, purees and soft solids in 9/10 trials with no overt signs of aspiration.   Status Achieved     SLP SHORT TERM GOAL #4   Title Patient will tolerate 6oz water, >95% with no overt signs of aspiration.   Status Achieved     SLP SHORT TERM GOAL #5   Title Patient will utilize verbal cues for lip closure, dry swallow for secretion management in 75% of opportunities.   Status Achieved          SLP Long Term Goals - 06/17/16 1814      SLP LONG TERM GOAL #1   Title Patient will utilize multimodal communication/AAC to clarify wants, needs, thoughts or ideas during communication breakdowns with max A  Status Deferred     SLP LONG TERM GOAL #2   Title Patient will utilize intelligibility strategies, breath support with min A visual cues for 5-7 word responses with 80% accuracy.   Baseline 06/17/16   Time 2   Period Weeks   Status On-going     SLP LONG TERM GOAL #3   Title Patient will demonstrate readiness for MBS to determine safety for pleasure feeds by timely initiation of swallow in 3/5 trials with ice chips/H20.   Status Achieved     SLP LONG TERM GOAL #4   Title Patient will adequately masticate and clear soft solids with mod verbal cues and no overt signs of aspiration in 4/5 trials.   Time 2   Period Weeks   Status On-going     SLP LONG TERM GOAL #5   Title Patient will utilize visual cue for lip closure, dry swallow for secretion management in 75% of opportunities.   Baseline 5.7.18, 06/07/16   Time 2   Period Weeks   Status On-going          Plan - 06/17/16 1818    Clinical Impression Statement Patient is alert and attentive throughout session. Pt self-feds 6oz  thin water, 5 bites of mechanical soft solid (with assistance and adaptive cup) requires occasional mod A for lip closure, consistent min A for dry swallows oral residue of soft solid. Sustained /a/ phonation with increased amplitude, duration.  Recommend cont' d skilled ST in order to maximize functional communication, swallowing safety, reduce caregiver burden and improve quality of life.      Patient will benefit from skilled therapeutic intervention in order to improve the following deficits and impairments:   Dysarthria and anarthria  Dysphagia, oropharyngeal phase    Problem List Patient Active Problem List   Diagnosis Date Noted  . Heterotopic ossification of bone 03/10/2016  . Acute on chronic respiratory failure with hypoxia ([facility])   . Acute hypoxemic respiratory failure ([facility])   . Acute blood loss anemia   . Atelectasis 02/04/2016  . Debility 02/03/2016  . Pneumonia of both lower lobes due to Pseudomonas species ([facility])   . Pneumonia of both lower lobes due to methicillin susceptible Staphylococcus aureus (MSSA) ([facility])   . Acute respiratory failure with hypoxia ([facility])   . Acute encephalopathy   . Seizures ([facility])   . Sepsis ([facility])   . Hypoxia   . Pain   . HCAP (healthcare-associated pneumonia) 12/28/2015  . Chronic respiratory failure ([facility]) 12/28/2015  . Acute tracheobronchitis 12/24/2015  . Tracheostomy tube present ([facility])   . Tracheal stenosis   . Chronic respiratory failure with hypoxia ([facility])   . 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 Tracheostomy status ([facility]) 09/26/2015  . Allergic rhinitis 09/26/2015  . Viral encephalitis 09/22/2015  . Movement disorder 09/22/2015  . Encephalitis 09/22/2015  . Chest pain 02/07/2014  . Premature atrial contractions 01/13/2014  . Abnormality of gait 12/04/2013  . Hyperlipidemia 12/21/2011  . Cardiovascular risk factor 12/21/2011  . Bunion 01/31/2008  . Metatarsalgia of both feet 09/20/2007  . FLAT FOOT 09/20/2007  . HALLUX RIGIDUS,  ACQUIRED 09/20/2007    Aliene Altes 06/17/2016, 6:19 PM  [facility] [address] [place], [place], [address] Phone: [phone]   Fax:  [phone]   Name: Judy Spears MRN: [id number] Date of Birth: 28-Oct-1950

## 2016-06-18 ENCOUNTER — Encounter: Payer: Self-pay | Admitting: Occupational Therapy

## 2016-06-18 ENCOUNTER — Other Ambulatory Visit: Payer: Self-pay | Admitting: Family Medicine

## 2016-06-18 ENCOUNTER — Encounter: Payer: Self-pay | Admitting: Pulmonary Disease

## 2016-06-18 ENCOUNTER — Ambulatory Visit (INDEPENDENT_AMBULATORY_CARE_PROVIDER_SITE_OTHER): Payer: Medicare Other | Admitting: Pulmonary Disease

## 2016-06-18 ENCOUNTER — Telehealth: Payer: Self-pay

## 2016-06-18 ENCOUNTER — Ambulatory Visit (INDEPENDENT_AMBULATORY_CARE_PROVIDER_SITE_OTHER)
Admission: RE | Admit: 2016-06-18 | Discharge: 2016-06-18 | Disposition: A | Discharge status: Home / Self Care | Payer: Medicare Other | Source: Ambulatory Visit | Attending: Pulmonary Disease | Admitting: Pulmonary Disease

## 2016-06-18 VITALS — BP 122/74 | HR 66

## 2016-06-18 DIAGNOSIS — R059 Cough, unspecified: Secondary | ICD-10-CM

## 2016-06-18 DIAGNOSIS — Z1231 Encounter for screening mammogram for malignant neoplasm of breast: Secondary | ICD-10-CM

## 2016-06-18 DIAGNOSIS — Z930 Tracheostomy status: Secondary | ICD-10-CM

## 2016-06-18 DIAGNOSIS — R0602 Shortness of breath: Secondary | ICD-10-CM | POA: Diagnosis not present

## 2016-06-18 DIAGNOSIS — R05 Cough: Secondary | ICD-10-CM | POA: Diagnosis not present

## 2016-06-18 NOTE — Telephone Encounter (Signed)
lmtcb X1 for pt's spouse to relay results/recs.

## 2016-06-18 NOTE — Telephone Encounter (Signed)
-----   Message from Judy Doom, MD sent at 06/18/2016  5:02 PM EDT ----- A,  Please let her husband know that her CXR looks OK  Thanks, Ruby Cola

## 2016-06-18 NOTE — Therapy (Signed)
[facility] [address] [place], [place], [address] Phone: [phone]   Fax:  [phone]  Physical Therapy Treatment  Patient Details  Name: Judy Spears MRN: [id number] Date of Birth: Dec 14, 1950 Referring Provider: Alger Simons, MD/Andrew Letta Pate, MD  Encounter Date: 06/17/2016      PT End of Session - 06/18/16 1306    Visit Number 29   Number of Visits 36   Date for PT Re-Evaluation 06/29/16   Authorization Type BCBS   PT Start Time 1448   PT Stop Time 3428   PT Time Calculation (min) 45 min      Past Medical History:  Diagnosis Date  . Arthritis    lt great toe  . Complication of anesthesia    pt has had headaches post op-did advise to hydra well preop  . Hair loss    right-sided  . History of exercise stress test    a. ETT (12/15) with 12:00 exercise, no ST changes, occasional PACs.   . Hyperlipidemia   . Insomnia   . Migraine headache   . Movement disorder    resltess in left legs  . Postmenopausal   . Premature atrial contractions    a. Holter (12/15) with 8% PACs, no atrial runs or atrial fibrillation.     Past Surgical History:  Procedure Laterality Date  . BREAST BIOPSY  01/01/2011   Procedure: BREAST BIOPSY WITH NEEDLE LOCALIZATION;  Surgeon: Rolm Bookbinder, MD;  Location: [facility];  Service: General;  Laterality: Left;  left breast wire localization biopsy  . cataracts right eye    . CESAREAN SECTION  1986  . HERNIA REPAIR  2000   RIH  . opirectinal membrane peel    . RHINOPLASTY  1983    There were no vitals filed for this visit.      Subjective Assessment - 06/18/16 1303    Subjective No c/o's reported by caregiver or pt   Patient is accompained by: Family member   Pertinent History Precautions:  PEG.  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  Another round of botox in LLE on 02/06/16   Patient Stated Goals Per husband, "For Cas[place] to be as independent as possible."   Currently in Pain? No/denies                         [facility] Adult PT Treatment/Exercise - 06/18/16 0001      Transfers   Transfers Sit to Stand   Sit to Stand 4: Min assist   Sit to Stand Details Tactile cues for placement;Verbal cues for technique;Manual facilitation for weight shifting;Verbal cues for gait pattern   Sit to Stand Details (indicate cue type and reason) cues to lean anteriorly   Stand to Sit 4: Min assist;3: Mod assist   Stand to Sit Details (indicate cue type and reason) Verbal cues for technique;Tactile cues for placement   Stand to Sit Details cues to flex at hips     Ambulation/Gait   Ambulation/Gait Yes   Ambulation/Gait Assistance 4: Min assist;3: Mod assist   Ambulation/Gait Assistance Details cues to tuck hips and keep shoulders back for upright posture   Ambulation Distance (Feet) 115 Feet  60' to speech office   Assistive device --  UP walker   Gait  Pattern Decreased stance time - right;Decreased hip/knee flexion - right;Decreased hip/knee flexion - left;Decreased weight shift to right;Scissoring;Ataxic   Ambulation Surface Level;Indoor     Pt performed tall kneeling activity to green swiss therapy ball for UE support - performed weight shifting with abducting/adducting LE's with mod to max assist 5 reps each Partial squats in tall kneeling x 10 reps with min to mod assist Trunk flexion/extension for balance and strengthening x 10 reps each             PT Short Term Goals - 05/28/16 1522      PT SHORT TERM GOAL #1   Title Pt will consistently perform supine rolling in B directions at S level (with rails as needed) in order to indicate improved trunk dissociation and increaed independence with bed mobility.  (Target Date for STGs:  05/28/16)   Baseline min assist needed per husband's report - 05-24-16   Time 4   Period Weeks   Status On-going     PT SHORT TERM GOAL #2   Title Pt will consistently perform R SL>sit at mod A level in order to indicate increased independence with getting out of bed.     Baseline inconsistently met per husband's report - 05-27-16   Time 4   Period Weeks   Status On-going     PT SHORT TERM GOAL #3   Title Pt will consistently perform L SL>sit at min A level in order to indicate increased independene with getting out of bed/off of exercise mat table.    Baseline mod to min assist needed - 05-27-16   Status Partially Met     PT SHORT TERM GOAL #4   Title Pt will consistently perform sit to SL R and L at mod A level in order to indicate improved independence with bed mobility.     Baseline met 05-28-16   Status Achieved     PT SHORT TERM GOAL #5   Title Pt will perform transfers from w/c to seated surface at min/guard level in order to indicate improved independence with transfers.    Baseline mod assist needed - 05-24-16   Status On-going     PT SHORT TERM GOAL #6   Title Pt will perform static standing with BUE support x 3 mins at S level with no overt LOB to decrease burden of care and increase independence with ADLs.    Baseline Pt able to stand for 3" but amount of assist varies - supervision to mod assist depending on fatigue - 05-28-16   Time 4   Period Weeks   Status On-going     PT SHORT TERM GOAL #7   Title Pt will perform dynamic sitting at [facility] with intermittent UE support, reaching outside of BOS x 5 mins in order to indicate safety with ADLs.    Baseline met 05-24-16   Status Achieved           PT Long Term Goals - 05/01/16 0953      PT LONG TERM GOAL #1   Title Pt will perform supine rolling R and L at mod I level in order to indicate improved independence with bed mobility.  (Target Date: 06/25/16)   Time 8   Period Weeks   Status New     PT LONG TERM GOAL #2    Title Pt will perform sit>SL R or L at min A level in order to indicate improved independence with getting into bed.    Time 8  Period Weeks   Status New     PT LONG TERM GOAL #3   Title Pt will perform SL R to sit at min A level, SL L to sit at S level in order to indicate increased independence with getting out of bed.     Time 8   Period Weeks   Status New     PT LONG TERM GOAL #4   Title Pt will perform transfers at S level 50% of the time in order to indicate improved functional independence.    Time 8   Period Weeks   Status New     PT LONG TERM GOAL #5   Title Pt will be able to stand with single UE support while performing opposite UE task x 3 mins at S level in order to indicate improved independence with ADLs.    Time 8   Period Weeks   Status New     Additional Long Term Goals   Additional Long Term Goals Yes     PT LONG TERM GOAL #6   Title Pt will demonstrate ability to ambulate up to 25' w/ LRAD at min A level in order to assist with getting around home.     Time 8   Period Weeks   Status New               Plan - 06/18/16 1307    Clinical Impression Statement Pt did very well with ambulation with use of UP walker with improved posture noted today compared to performance on Tues., 06-15-16;  pt amb. from PT gym to speech office with UP walker with min to mod assist and sat in regular chair for speech therapy , rather than in wheelchair for 1st time today   Rehab Potential Fair   Clinical Impairments Affecting Rehab Potential Chronicity of condition, continued medical management, but has very strong family and financial support   PT Frequency 2x / week   PT Duration 8 weeks   PT Treatment/Interventions ADLs/Self Care Home Management;DME Instruction;Gait training;Stair training;Functional mobility training;Therapeutic activities;Patient/family education;Cognitive remediation;Neuromuscular re-education;Therapeutic exercise;Balance training;Orthotic  Fit/Training;Wheelchair mobility training;Manual techniques;Splinting;Visual/perceptual remediation/compensation;Vestibular;Passive range of motion;Electrical Stimulation   PT Next Visit Plan begin checking LTG's for renewal   Consulted and Agree with Plan of Care Patient   Family Member Consulted caregiver, [name] and husband [name]      Patient will benefit from skilled therapeutic intervention in order to improve the following deficits and impairments:  Abnormal gait, Decreased balance, Decreased endurance, Decreased cognition, Cardiopulmonary status limiting activity, Decreased activity tolerance, Decreased coordination, Decreased strength, Impaired flexibility, Impaired tone, Decreased mobility, Increased muscle spasms, Impaired vision/preception, Decreased range of motion, Impaired UE functional use, Postural dysfunction, Improper body mechanics, Impaired perceived functional ability, Decreased knowledge of precautions, Decreased knowledge of use of DME  Visit Diagnosis: Other abnormalities of gait and mobility  Unsteadiness on feet     Problem List Patient Active Problem List   Diagnosis Date Noted  . Heterotopic ossification of bone 03/10/2016  . Acute on chronic respiratory failure with hypoxia ([facility])   . Acute hypoxemic respiratory failure ([facility])   . Acute blood loss anemia   . Atelectasis 02/04/2016  . Debility 02/03/2016  . Pneumonia of both lower lobes due to Pseudomonas species ([facility])   . Pneumonia of both lower lobes due to methicillin susceptible Staphylococcus aureus (MSSA) ([facility])   . Acute respiratory failure with hypoxia ([facility])   . Acute encephalopathy   . Seizures ([place])   . Sepsis (  [place])   . Hypoxia   . Pain   . HCAP (healthcare-associated pneumonia) 12/28/2015  . Chronic respiratory failure ([facility]) 12/28/2015  . Acute tracheobronchitis 12/24/2015  . Tracheostomy tube present ([place])   . Tracheal stenosis   . Chronic respiratory failure with hypoxia ([facility])   . 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 Tracheostomy status ([facility]) 09/26/2015  . Allergic rhinitis 09/26/2015  . Viral encephalitis 09/22/2015  . Movement disorder 09/22/2015  . Encephalitis 09/22/2015  . Chest pain 02/07/2014  . Premature atrial contractions 01/13/2014  . Abnormality of gait 12/04/2013  . Hyperlipidemia 12/21/2011  . Cardiovascular risk factor 12/21/2011  . Bunion 01/31/2008  . Metatarsalgia of both feet 09/20/2007  . FLAT FOOT 09/20/2007  . HALLUX RIGIDUS, ACQUIRED 09/20/2007    Alda Lea, PT 06/18/2016, 1:11 PM  [facility] [address] [place], [place], [address] Phone: [phone]   Fax:  [phone]  Name: Judy Spears MRN: [id number] Date of Birth: [birth year]

## 2016-06-18 NOTE — Patient Instructions (Signed)
We will call you with the results of today's CXR Keep changing the tracheostomy per your routine We will see you back in 6-8 weeks with either myself or the nurse practitioner Once your cough has strengthened we will consider tracheostomy closure

## 2016-06-18 NOTE — Therapy (Signed)
[facility] [address] [place], [place], [address] Phone: [phone]   Fax:  [phone]  Occupational Therapy Treatment  Patient Details  Name: Judy Spears MRN: [id number] Date of Birth: Jun 10, 1950 Referring Provider: Dr. Quita Skye. [name]  Encounter Date: 06/17/2016      OT End of Session - 06/18/16 0957    Visit Number 28   Number of Visits 32   Date for OT Re-Evaluation 07/08/16   Authorization Type BCBS unlimited visits   OT Start Time 1400   OT Stop Time 1445   OT Time Calculation (min) 45 min   Activity Tolerance Patient tolerated treatment well   Behavior During Therapy [facility] for tasks assessed/performed      Past Medical History:  Diagnosis Date  . Arthritis    lt great toe  . Complication of anesthesia    pt has had headaches post op-did advise to hydra well preop  . Hair loss    right-sided  . History of exercise stress test    a. ETT (12/15) with 12:00 exercise, no ST changes, occasional PACs.   . Hyperlipidemia   . Insomnia   . Migraine headache   . Movement disorder    resltess in left legs  . Postmenopausal   . Premature atrial contractions    a. Holter (12/15) with 8% PACs, no atrial runs or atrial fibrillation.     Past Surgical History:  Procedure Laterality Date  . BREAST BIOPSY  01/01/2011   Procedure: BREAST BIOPSY WITH NEEDLE LOCALIZATION;  Surgeon: Rolm Bookbinder, MD;  Location: [facility];  Service: General;  Laterality: Left;  left breast wire localization biopsy  . cataracts right eye    . CESAREAN SECTION  1986  . HERNIA REPAIR  2000   RIH  . opirectinal membrane peel    . RHINOPLASTY  1983    There were no vitals filed for this visit.      Subjective Assessment - 06/18/16 0952    Subjective  Good   Patient is accompained by: Family member   Pertinent History see epic snapshot - ABI/encephalitis/hypoxia from virus; recent hospitalization due  to ARF;    Patient Stated Goals Pt unable to state.    Currently in Pain? No/denies   Pain Score 0-No pain                      OT Treatments/Exercises (OP) - 06/18/16 0001      Neurological Re-education Exercises   Other Exercises 1 Neuro reeducation to address trunk available movement,.  Patient with limited passive trunk lateral flexion to right side.  Patient with limited passive neck flexion toqward  right.  Patient repsnded well to repetitive guided movement to increase first passive than active trunk flexion- then rotation.     Other Exercises 2 Worked on static dynamic stand balance.  With Upper extremities passively supported on table able to stand with intermittent min assist- at times with close supervision.  Patient able to step her feet with UE support on table - without excessive trunk / hip flexion / extension.                  OT Education - 06/18/16 0957    Education provided No          OT Short Term Goals - 06/15/16 1553      OT SHORT TERM GOAL #1   Title Pt, husband and cargiver will be  mod I with HEP - 03/29/2016   Status Achieved     OT SHORT TERM GOAL #2   Title Pt will be able to sit with supervision without UE support and vc's to assist with midline 75% while engage in functional task   Status Achieved     OT SHORT TERM GOAL #3   Title Pt will be able to complete sit to stand consistently with mod A for toilet transfers (max a for pivot)   Status Achieved     OT SHORT TERM GOAL #4   Title Pt, husband and caregiver will verbalize understanding of various bathroom accomodations to reduce long term caregiver burden   Status Achieved     OT SHORT TERM GOAL #5   Title Pt will be able to stand step transfer to toilet with mo more than mod a, mod vc's consistently using grab bar for community toileting needs- 06/10/2016 date adjusted as pt missed 2 weeks due to hospitalization   Status Achieved     OT SHORT TERM GOAL #6   Title Pt will  be able to wash face with supervision/max cues 50% of the time, min a 50% of the time.    Status Achieved     OT SHORT TERM GOAL #7   Title Pt will be able to don pull over shirt with mod a, max cues   Status Achieved     OT SHORT TERM GOAL #8   Title Pt will be able to maintain static standing with UE support with close supervision to reduce burden of care for caregivers    Status Achieved           OT Long Term Goals - 06/15/16 1553      OT LONG TERM GOAL #1   Title Pt, husband and caregiver will be mod I with upgraded HEP prn - 04/26/2016   Status Achieved     OT LONG TERM GOAL #2   Title Pt, husband and caregiver will be mod I with splint wear and care    Status Achieved     OT LONG TERM GOAL #3   Title Pt will be able to wash face with min a only   Status Achieved     OT LONG TERM GOAL #4   Title Pt will be able to sit without UE support with supevision 100% of the time with no more than 2 vc's while engaged in functional activity.   Status Achieved     OT LONG TERM GOAL #5   Title Pt, husband and caregiver will verbalize understanding of potential after care therapeutic opportunities. - 07/08/2016 date adjusted as pt missed 2 weeks due to hospitalization   Status On-going     OT LONG TERM GOAL #6   Title Pt will be supervision to wash face consistently   Status On-going     OT LONG TERM GOAL #7   Title Pt will don shirt with min a and max vc's 50% of the time   Status On-going     OT LONG TERM GOAL #8   Title Pt will require min a for stand step transfers to toilet using grab bar for community toileting at least 75% of the time.   Status On-going     OT LONG TERM GOAL  #9   Baseline Pt will stand with contact gurard with only 1 UE support for static standing balance in prep for abiilty to engage in functional task while standing.  Status On-going               Plan - 06/18/16 0958    Clinical Impression Statement Patient continues to demonstrate  steady improvement with postural control and mobility which is translating to improved functional performance with ADL   Rehab Potential Fair   Clinical Impairments Affecting Rehab Potential severity of deficits, very slow rate of recovery   OT Frequency 2x / week   OT Duration 8 weeks   OT Treatment/Interventions Self-care/ADL training;Ultrasound;Moist Heat;Electrical Stimulation;DME and/or AE instruction;Neuromuscular education;Therapeutic exercise;Functional Mobility Training;Manual Therapy;Passive range of motion;Splinting;Therapeutic activities;Balance training;Patient/family education;Visual/perceptual remediation/compensation;Cognitive remediation/compensation   Plan NMR for functional use of UE's, static to dynamic stand balance, postural control with one UE support   OT Home Exercise Plan Reviewed plan for home management for left elbow range of motion - JAS 3x/day FOR 30 min./time, and locked elbow extesnion brace at night   Consulted and Agree with Plan of Care Patient;Family member/caregiver   Family Member Consulted caregiver [name] , husband [name]      Patient will benefit from skilled therapeutic intervention in order to improve the following deficits and impairments:  Decreased endurance, Decreased skin integrity, Impaired vision/preception, Improper body mechanics, Impaired perceived functional ability, Decreased knowledge of precautions, Cardiopulmonary status limiting activity, Decreased activity tolerance, Decreased knowledge of use of DME, Decreased strength, Impaired flexibility, Improper spinal/pelvic alignment, Impaired sensation, Difficulty walking, Decreased mobility, Decreased balance, Decreased cognition, Decreased range of motion, Impaired tone, Impaired UE functional use, Decreased safety awareness, Decreased coordination, Pain  Visit Diagnosis: Abnormal posture  Muscle weakness (generalized)  Unsteadiness on feet  Other symptoms and signs involving the nervous  system  Stiffness of right shoulder, not elsewhere classified  Stiffness of left shoulder, not elsewhere classified  Stiffness of left elbow, not elsewhere classified  Frontal lobe and executive function deficit  Other lack of coordination    Problem List Patient Active Problem List   Diagnosis Date Noted  . Heterotopic ossification of bone 03/10/2016  . Acute on chronic respiratory failure with hypoxia ([place])   . Acute hypoxemic respiratory failure ([place])   . Acute blood loss anemia   . Atelectasis 02/04/2016  . Debility 02/03/2016  . Pneumonia of both lower lobes due to Pseudomonas species ([place])   . Pneumonia of both lower lobes due to methicillin susceptible Staphylococcus aureus (MSSA) ([place])   . Acute respiratory failure with hypoxia ([place])   . Acute encephalopathy   . Seizures ([place])   . Sepsis ([place])   . Hypoxia   . Pain   . HCAP (healthcare-associated pneumonia) 12/28/2015  . Chronic respiratory failure ([place]) 12/28/2015  . Acute tracheobronchitis 12/24/2015  . Tracheostomy tube present ([place])   . Tracheal stenosis   . Chronic respiratory failure with hypoxia ([place])   . 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[place]) 10/20/2015  . Tracheostomy status ([place]) 09/26/2015  . Allergic rhinitis 09/26/2015  . Viral encephalitis 09/22/2015  . Movement disorder 09/22/2015  . Encephalitis 09/22/2015  . Chest pain 02/07/2014  . Premature atrial contractions 01/13/2014  . Abnormality of gait 12/04/2013  . Hyperlipidemia 12/21/2011  . Cardiovascular risk factor 12/21/2011  . Bunion 01/31/2008  . Metatarsalgia of both feet 09/20/2007  . FLAT FOOT 09/20/2007  . HALLUX RIGIDUS, ACQUIRED 09/20/2007    Mariah Milling, OTR/L 06/18/2016, 10:00 AM  [facility] [address] [place], [place], [address] Phone: [phone]   Fax:  [phone]  Name: Judy Spears MRN: [id number] Date of Birth: 12-23-1950

## 2016-06-18 NOTE — Progress Notes (Signed)
Subjective:    Patient ID: Judy Spears, female    DOB: 1950/07/04, 66 y.o.   MRN: [id number]  Synopsis: In November she 2016 was bitten by a tick and then within 2 weeks had a progressive neurologic illness. Specifically she was found unresponsive by her husband while [place] of [place]. She was brought to local hospital where she required intubation due to unresponsiveness. Tracheostomy was performed on hospital day 2 and extensive workup for her neurologic illness begun. 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 She was then transferred for inpatient rehabilitation to the [facility] in [place]. While there she continued to participate in physical therapy and occupational therapy. However, during this time she was never able to consistently take by mouth and all nutrition has been provided the enteral feeding since around the time of her admission to the intensive care unit.  The tracheostomy has been in place since her original hospitalization in December 2016. It was briefly removed in May of 2017 but unfortunately she had a complication of laryngospasm and it sounds like the tracheostomy was replaced approximately 2 weeks later.  The tracheostomy was removed on 11/12/2015.  Not long afterwards she was hospitalized for healthcare associated pneumonia. This is a prolonged hospitalization complicated by encephalopathy and ultimately she ended up back in rehabilitation once again.  The tracheostomy was removed again in the spring of 2018. Shortly after this decannulation she required hospitalization once again for pneumonia.  HPI Chief Complaint  Patient presents with  . Follow-up    pt states she is breathing well, states trach has been tight X2 days.  pt states she feels like she is congested in chest but is not producing mucus currently.     She has steadily improved since her  hospital discharge. She will occasionally cough up some mucus around her tracheostomy. She has required suctioning from the tracheostomy approximately twice since her hospital discharge.  She has been walking in the neighborhood. Her husband notes that her breathing has improved and is less shallow than previous.  This morning she had a slight increase in cough with congestion after drinking some water. She has been suctioned once. No fever or chills.   Past Medical History:  Diagnosis Date  . Arthritis    lt great toe  . Complication of anesthesia    pt has had headaches post op-did advise to hydra well preop  . Hair loss    right-sided  . History of exercise stress test    a. ETT (12/15) with 12:00 exercise, no ST changes, occasional PACs.   . Hyperlipidemia   . Insomnia   . Migraine headache   . Movement disorder    resltess in left legs  . Postmenopausal   . Premature atrial contractions    a. Holter (12/15) with 8% PACs, no atrial runs or atrial fibrillation.       Review of Systems     Objective:   Physical Exam Vitals:   06/18/16 1503  BP: 122/74  Pulse: 66  SpO2: 98%    Gen: chronically ill appearing HENT: OP clear,  neck supple, tracheostomy in place PULM: CTA B, normal percussion CV: RRR, no mgr, trace edema GI: BS+, soft, nontender Derm: no cyanosis or rash        Assessment & Plan:  Tracheostomy status ([place]) We have learned in the last 6 months that we cannot  remove her tracheostomy without complication of pneumonia. This is primarily due to a weak cough due to her patent tracheal stoma.  While her cough has improved somewhat, it is still fairly weak even with the tracheostomy in place and capped.  Ultimately once her cough has improved (strong enough to where it can be heard from at least one room away) and she has remained free of acute respiratory infection we can consider tracheostomy removal with immediate tracheostomy stoma closure.  In  the meantime she will continue with routine trach changes.  > 50% of this 26 minute visit spent face to face   Current Outpatient Prescriptions:  .  amantadine (SYMMETREL) 50 MG/5ML solution, Place 15 mLs (150 mg total) into feeding tube 2 (two) times daily. In am and at noon, Disp: 473 mL, Rfl: 0 .  amantadine (SYMMETREL) 50 MG/5ML solution, PLACE 76m INTO FEEDING TUBE TWICE DAILY, Disp: 900 mL, Rfl: 0 .  amLODipine (NORVASC) 5 MG tablet, Place 1 tablet (5 mg total) into feeding tube daily., Disp: 30 tablet, Rfl: 2 .  atorvastatin (LIPITOR) 20 MG tablet, Place 20 mg into feeding tube at bedtime. , Disp: , Rfl:  .  baclofen (LIORESAL) 10 MG tablet, Place 1 tablet (10 mg total) into feeding tube every 6 (six) hours., Disp: 120 each, Rfl: 5 .  chlorhexidine (PERIDEX) 0.12 % solution, 170mIN THE MOUTH OR THROAT TWICE DAILY, Disp: 120 mL, Rfl: 6 .  chlorhexidine gluconate, MEDLINE KIT, (PERIDEX) 0.12 % solution, 15 mLs by Mouth Rinse route 2 (two) times daily. (Patient taking differently: 5 mLs by Mouth Rinse route 2 (two) times daily. ), Disp: 120 mL, Rfl: 0 .  Coenzyme Q10 (COQ10 PO), Take 10 mLs by mouth at bedtime., Disp: , Rfl:  .  diazepam (VALIUM) 2 MG tablet, PLACE 1 TABLET INTO FEEDING TUBE AT BEDTIME, Disp: 30 tablet, Rfl: 1 .  docusate (COLACE) 60 MG/15ML syrup, Place 100 mg into feeding tube daily. Tales once daily, but can take 1 addt'l dose as needed for constipation, Disp: , Rfl:  .  famotidine (PEPCID) 20 MG tablet, Take 1 tablet (20 mg total) by mouth 2 (two) times daily., Disp: 60 tablet, Rfl: 3 .  fluticasone (FLONASE) 50 MCG/ACT nasal spray, Place 1 spray into both nostrils 2 (two) times daily., Disp: , Rfl:  .  guaiFENesin (ROBITUSSIN) 100 MG/5ML SOLN, Place 10 mLs (200 mg total) into feeding tube 4 (four) times daily., Disp: 1200 mL, Rfl: 0 .  Hydrocortisone (GERHARDT'S BUTT CREAM) CREA, Apply 1 application topically 4 (four) times daily., Disp: 1 each, Rfl: 0 .  loratadine  (CLARITIN) 10 MG tablet, Place 1 tablet (10 mg total) into feeding tube daily as needed for allergies. (Patient taking differently: Place 10 mg into feeding tube every morning. ), Disp: , Rfl:  .  Melatonin 3 MG TABS, Place 6 mg into feeding tube every evening., Disp: , Rfl:  .  Multiple Vitamin (MULTIVITAMIN) LIQD, Place 15 mLs into feeding tube daily., Disp: 1 Bottle, Rfl: 3 .  nystatin (MYCOSTATIN/NYSTOP) powder, Apply 1 g topically 2 (two) times daily as needed. (Patient taking differently: Apply 1 g topically 2 (two) times daily as needed (irritation). ), Disp: 45 g, Rfl: 0 .  potassium chloride 20 MEQ/15ML (10%) SOLN, Place 15 mLs (20 mEq total) into feeding tube daily., Disp: 900 mL, Rfl: 5 .  Probiotic Product (PROBIOTIC ADVANCED) CAPS, Take 1 capsule by mouth daily., Disp: , Rfl:  .  prochlorperazine (COMPAZINE) 5 MG  tablet, Place 1-2 tablets (5-10 mg total) into feeding tube every 6 (six) hours as needed for nausea., Disp: 30 tablet, Rfl: 0 .  simethicone (MYLICON) 40 IV/1.4YW drops, Place 0.6 mLs (40 mg total) into feeding tube 4 (four) times daily as needed for flatulence., Disp: 45 mL, Rfl: 0 .  valACYclovir (VALTREX) 500 MG tablet, PLACE 1 TABLET INTO FEEDING TUBE DAILY, Disp: 30 tablet, Rfl: 1 .  Water For Irrigation, Sterile (FREE WATER) SOLN, Place 150 mLs into feeding tube See admin instructions. Takes 126m 5 times daily, if over 2093mhold water and recheck within an hour, Disp: , Rfl:

## 2016-06-18 NOTE — Assessment & Plan Note (Signed)
We have learned in the last 6 months that we cannot remove her tracheostomy without complication of pneumonia. This is primarily due to a weak cough due to her patent tracheal stoma.  While her cough has improved somewhat, it is still fairly weak even with the tracheostomy in place and capped.  Ultimately once her cough has improved (strong enough to where it can be heard from at least one room away) and she has remained free of acute respiratory infection we can consider tracheostomy removal with immediate tracheostomy stoma closure.  In the meantime she will continue with routine trach changes.

## 2016-06-22 ENCOUNTER — Ambulatory Visit: Payer: Medicare Other | Admitting: Physical Therapy

## 2016-06-22 ENCOUNTER — Encounter: Payer: Self-pay | Admitting: Occupational Therapy

## 2016-06-22 ENCOUNTER — Ambulatory Visit: Payer: Medicare Other

## 2016-06-22 ENCOUNTER — Ambulatory Visit: Payer: Medicare Other | Admitting: Occupational Therapy

## 2016-06-22 DIAGNOSIS — M25611 Stiffness of right shoulder, not elsewhere classified: Secondary | ICD-10-CM

## 2016-06-22 DIAGNOSIS — R2681 Unsteadiness on feet: Secondary | ICD-10-CM

## 2016-06-22 DIAGNOSIS — M25622 Stiffness of left elbow, not elsewhere classified: Secondary | ICD-10-CM

## 2016-06-22 DIAGNOSIS — M25612 Stiffness of left shoulder, not elsewhere classified: Secondary | ICD-10-CM

## 2016-06-22 DIAGNOSIS — R471 Dysarthria and anarthria: Secondary | ICD-10-CM

## 2016-06-22 DIAGNOSIS — R293 Abnormal posture: Secondary | ICD-10-CM

## 2016-06-22 DIAGNOSIS — M6281 Muscle weakness (generalized): Secondary | ICD-10-CM

## 2016-06-22 DIAGNOSIS — R41844 Frontal lobe and executive function deficit: Secondary | ICD-10-CM

## 2016-06-22 DIAGNOSIS — R2689 Other abnormalities of gait and mobility: Secondary | ICD-10-CM

## 2016-06-22 DIAGNOSIS — R29818 Other symptoms and signs involving the nervous system: Secondary | ICD-10-CM

## 2016-06-22 DIAGNOSIS — R41841 Cognitive communication deficit: Secondary | ICD-10-CM

## 2016-06-22 DIAGNOSIS — R1312 Dysphagia, oropharyngeal phase: Secondary | ICD-10-CM

## 2016-06-22 NOTE — Therapy (Signed)
[facility] [address] [place], [place], [address] Phone: [phone]   Fax:  [phone]  Occupational Therapy Treatment  Patient Details  Name: Judy Spears MRN: [id number] Date of Birth: 1950-08-25 Referring Provider: Dr. Quita Skye. [name]  Encounter Date: 06/22/2016      OT End of Session - 06/22/16 1512    Visit Number 29   Number of Visits 32   Date for OT Re-Evaluation 07/08/16   Authorization Type [employer] unlimited visits   OT Start Time 3220   OT Stop Time 1400   OT Time Calculation (min) 43 min   Activity Tolerance Patient tolerated treatment well      Past Medical History:  Diagnosis Date  . Arthritis    lt great toe  . Complication of anesthesia    pt has had headaches post op-did advise to hydra well preop  . Hair loss    right-sided  . History of exercise stress test    a. ETT (12/15) with 12:00 exercise, no ST changes, occasional PACs.   . Hyperlipidemia   . Insomnia   . Migraine headache   . Movement disorder    resltess in left legs  . Postmenopausal   . Premature atrial contractions    a. Holter (12/15) with 8% PACs, no atrial runs or atrial fibrillation.     Past Surgical History:  Procedure Laterality Date  . BREAST BIOPSY  01/01/2011   Procedure: BREAST BIOPSY WITH NEEDLE LOCALIZATION;  Surgeon: Rolm Bookbinder, MD;  Location: [facility];  Service: General;  Laterality: Left;  left breast wire localization biopsy  . cataracts right eye    . CESAREAN SECTION  1986  . HERNIA REPAIR  2000   RIH  . opirectinal membrane peel    . RHINOPLASTY  1983    There were no vitals filed for this visit.      Subjective Assessment - 06/22/16 1327    Subjective  I got alot of cards (for her birthday)   Patient is accompained by: Family member  caregiver [name]   Pertinent History see epic snapshot - ABI/encephalitis/hypoxia from virus; recent hospitalization due to ARF;     Patient Stated Goals Pt unable to state.    Currently in Pain? No/denies                      OT Treatments/Exercises (OP) - 06/22/16 0001      ADLs   Functional Mobility Practiced repetitive squat pivot transfers from wheelchair to level surface mat.  Pt requires min a, mod v'cs to the left and light min a and no vc's to the right.  Pt beginning to be able to lift bottom and shift to the left .  Pt needs max cues to lean forward and assist to stay forward as she scoots.  Pt is able to scoot along the edge of the mat with cues and occasional min a.  Transitoned into supine on wedge (pt just had water via peg tube therefore could not lay flat) to address working on expancing rib cage in order to improve ability to take in and expel a deep breath to assist potentially with voicing, coughing and upright thoracic extension.  Pt extremely tight between rib cage and hips.  WIll continue to address                  OT Short Term Goals - 06/22/16 1510  OT SHORT TERM GOAL #1   Title Pt, husband and cargiver will be mod I with HEP - 03/29/2016   Status Achieved     OT SHORT TERM GOAL #2   Title Pt will be able to sit with supervision without UE support and vc's to assist with midline 75% while engage in functional task   Status Achieved     OT SHORT TERM GOAL #3   Title Pt will be able to complete sit to stand consistently with mod A for toilet transfers (max a for pivot)   Status Achieved     OT SHORT TERM GOAL #4   Title Pt, husband and caregiver will verbalize understanding of various bathroom accomodations to reduce long term caregiver burden   Status Achieved     OT SHORT TERM GOAL #5   Title Pt will be able to stand step transfer to toilet with mo more than mod a, mod vc's consistently using grab bar for community toileting needs- 06/10/2016 date adjusted as pt missed 2 weeks due to hospitalization   Status Achieved     OT SHORT TERM GOAL #6   Title Pt will  be able to wash face with supervision/max cues 50% of the time, min a 50% of the time.    Status Achieved     OT SHORT TERM GOAL #7   Title Pt will be able to don pull over shirt with mod a, max cues   Status Achieved     OT SHORT TERM GOAL #8   Title Pt will be able to maintain static standing with UE support with close supervision to reduce burden of care for caregivers    Status Achieved           OT Long Term Goals - 06/22/16 1510      OT LONG TERM GOAL #1   Title Pt, husband and caregiver will be mod I with upgraded HEP prn - 04/26/2016   Status Achieved     OT LONG TERM GOAL #2   Title Pt, husband and caregiver will be mod I with splint wear and care    Status Achieved     OT LONG TERM GOAL #3   Title Pt will be able to wash face with min a only   Status Achieved     OT LONG TERM GOAL #4   Title Pt will be able to sit without UE support with supevision 100% of the time with no more than 2 vc's while engaged in functional activity.   Status Achieved     OT LONG TERM GOAL #5   Title Pt, husband and caregiver will verbalize understanding of potential after care therapeutic opportunities. - 07/08/2016 date adjusted as pt missed 2 weeks due to hospitalization   Status On-going     OT LONG TERM GOAL #6   Title Pt will be supervision to wash face consistently   Status On-going     OT LONG TERM GOAL #7   Title Pt will don shirt with min a and max vc's 50% of the time   Status On-going     OT LONG TERM GOAL #8   Title Pt will require min a for stand step transfers to toilet using grab bar for community toileting at least 75% of the time.   Status On-going     OT LONG TERM GOAL  #9   Baseline Pt will stand with contact gurard with only 1 UE support for static standing balance  in prep for abiilty to engage in functional task while standing.    Status On-going               Plan - 06/22/16 1511    Clinical Impression Statement Pt with slow improvement in basic  mobility for level surface transfers.     Rehab Potential Fair   Current Impairments/barriers affecting progress: severity of deficits, very slow rate of recovery   OT Frequency 2x / week   OT Duration 8 weeks   OT Treatment/Interventions Self-care/ADL training;Ultrasound;Moist Heat;Electrical Stimulation;DME and/or AE instruction;Neuromuscular education;Therapeutic exercise;Functional Mobility Training;Manual Therapy;Passive range of motion;Splinting;Therapeutic activities;Balance training;Patient/family education;Visual/perceptual remediation/compensation;Cognitive remediation/compensation   Consulted and Agree with Plan of Care Patient;Family member/caregiver   Family Member Consulted caregiver Leesa ,    Plan NMR for trunk and funcitonal use of UE's, static to dynamic standing balance, postural control with one UE support      Patient will benefit from skilled therapeutic intervention in order to improve the following deficits and impairments:  Decreased endurance, Decreased skin integrity, Impaired vision/preception, Improper body mechanics, Impaired perceived functional ability, Decreased knowledge of precautions, Cardiopulmonary status limiting activity, Decreased activity tolerance, Decreased knowledge of use of DME, Decreased strength, Impaired flexibility, Improper spinal/pelvic alignment, Impaired sensation, Difficulty walking, Decreased mobility, Decreased balance, Decreased cognition, Decreased range of motion, Impaired tone, Impaired UE functional use, Decreased safety awareness, Decreased coordination, Pain  Visit Diagnosis: Abnormal posture  Muscle weakness (generalized)  Other symptoms and signs involving the nervous system  Unsteadiness on feet  Stiffness of right shoulder, not elsewhere classified  Stiffness of left shoulder, not elsewhere classified  Stiffness of left elbow, not elsewhere classified  Frontal lobe and executive function deficit    Problem  List Patient Active Problem List   Diagnosis Date Noted  . Heterotopic ossification of bone 03/10/2016  . Acute on chronic respiratory failure with hypoxia ([place])   . Acute hypoxemic respiratory failure ([place])   . Acute blood loss anemia   . Atelectasis 02/04/2016  . Debility 02/03/2016  . Pneumonia of both lower lobes due to Pseudomonas species ([place])   . Pneumonia of both lower lobes due to methicillin susceptible Staphylococcus aureus (MSSA) ([place])   . Acute respiratory failure with hypoxia ([place])   . Acute encephalopathy   . Seizures ([place])   . Sepsis ([place])   . Hypoxia   . Pain   . HCAP (healthcare-associated pneumonia) 12/28/2015  . Chronic respiratory failure ([place]) 12/28/2015  . Acute tracheobronchitis 12/24/2015  . Tracheostomy tube present ([place])   . Tracheal stenosis   . Chronic respiratory failure with hypoxia ([place])   . 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 Tracheostomy status ([place]) 09/26/2015  . Allergic rhinitis 09/26/2015  . Viral encephalitis 09/22/2015  . Movement disorder 09/22/2015  . Encephalitis 09/22/2015  . Chest pain 02/07/2014  . Premature atrial contractions 01/13/2014  . Abnormality of gait 12/04/2013  . Hyperlipidemia 12/21/2011  . Cardiovascular risk factor 12/21/2011  . Bunion 01/31/2008  . Metatarsalgia of both feet 09/20/2007  . FLAT FOOT 09/20/2007  . HALLUX RIGIDUS, ACQUIRED 09/20/2007    Quay Burow, OTR/L 06/22/2016, 3:16 PM  [facility] [address] [place], [place], [address] Phone: [phone]   Fax:  [phone]  Name: Judy Spears MRN: [id number] Date of Birth: 04/19/1950

## 2016-06-22 NOTE — Therapy (Signed)
[facility] [address] [place], [place], [address] Phone: [phone]   Fax:  [phone]  Speech Language Pathology Treatment  Patient Details  Name: Judy Spears MRN: [id number] Date of Birth: 06-Dec-1950 Referring Provider: Charlett Blake, MD  Encounter Date: 06/22/2016      End of Session - 06/22/16 1645    Visit Number 29   Number of Visits 33   Date for SLP Re-Evaluation 06/25/16   SLP Start Time 1448   SLP Stop Time  1696   SLP Time Calculation (min) 44 min   Activity Tolerance Patient tolerated treatment well      Past Medical History:  Diagnosis Date  . Arthritis    lt great toe  . Complication of anesthesia    pt has had headaches post op-did advise to hydra well preop  . Hair loss    right-sided  . History of exercise stress test    a. ETT (12/15) with 12:00 exercise, no ST changes, occasional PACs.   . Hyperlipidemia   . Insomnia   . Migraine headache   . Movement disorder    resltess in left legs  . Postmenopausal   . Premature atrial contractions    a. Holter (12/15) with 8% PACs, no atrial runs or atrial fibrillation.     Past Surgical History:  Procedure Laterality Date  . BREAST BIOPSY  01/01/2011   Procedure: BREAST BIOPSY WITH NEEDLE LOCALIZATION;  Surgeon: Rolm Bookbinder, MD;  Location: [facility];  Service: General;  Laterality: Left;  left breast wire localization biopsy  . cataracts right eye    . CESAREAN SECTION  1986  . HERNIA REPAIR  2000   RIH  . opirectinal membrane peel    . RHINOPLASTY  1983    There were no vitals filed for this visit.      Subjective Assessment - 06/22/16 1627    Subjective Pt was transferred into regular chair from wheelchair upon entry to [facility].   Patient is accompained by: --  personal assistant, [name]   Currently in Pain? No/denies               ADULT SLP TREATMENT - 06/22/16 1628      General Information   Behavior/Cognition Alert;Cooperative;Pleasant mood;Requires cueing  mildly impulsive     Treatment Provided   Treatment provided Dysphagia     Dysphagia Treatment   Temperature Spikes Noted No   Respiratory Status Room air   Oral Cavity - Dentition Adequate natural dentition   Treatment Methods Skilled observation;Compensation strategy training;Therapeutic exercise   Patient observed directly with PO's Yes   Type of PO's observed Thin liquids;Dysphagia 3 (soft)   Feeding Needs assist   Liquids provided via Cup   Oral Phase Signs & Symptoms Anterior loss/spillage   Pharyngeal Phase Signs & Symptoms Audible swallow;Multiple swallows   Type of cueing Verbal;Visual   Amount of cueing Moderate   Other treatment/comments Pt's caregiver stated pt had grilled cheese on SAturday and hamburger yesterday. Oral care provided before and after POs. Pt took single sips of liquid once verbally cued to do so by SLP with 6/7 frequency (pt initially took 2 swallows consecutively). Pt placed good labial seal around bottom of cup 4/7 and incr'd to 6/7 with verbal cues. She took two swallows with liquids 5/7 and incr'd to 7/7 with verbal and visual cues. With mechanical soft items pt took multiple swallows 7/7 without cues necessary. Mild oral residue was  evident on 6/7 boluses. Pt req'd cues x1 to reduce bite size. Extra time with mastication req'd consistently. Pt's additional swallows were not always sufficient to clear oral residue from oral cavity due to food particles seen in pt's oral leakage after two-three swallows.      Tracheostomy Shiley 4 mm Uncuffed   Tracheostomy Properties Placement Date: 05/08/16 Placement Time: 1030 Placed By: ICU physician Brand: Shiley Size (mm): 4 mm Style: Uncuffed     Assessment / Recommendations / Plan   Plan Continue with current plan of care     Dysphagia Recommendations   Diet recommendations Dysphagia 1 (puree);Thin liquid   Liquids provided via Teaspoon;Cup    Medication Administration Via alternative means   Supervision Full supervision/cueing for compensatory strategies   Compensations Effortful swallow;Multiple dry swallows after each bite/sip;Check for anterior loss;Small sips/bites;Slow rate   Postural Changes and/or Swallow Maneuvers Seated upright 90 degrees     Progression Toward Goals   Progression toward goals Progressing toward goals          SLP Education - 06/22/16 1643    Education provided Yes   Education Details pt told to swallow instead of wipe mouth - did not change pts habit of wiping mouth consistently   Person(s) Educated Patient;Caregiver(s)   Methods Explanation   Comprehension Verbalized understanding          SLP Short Term Goals - 06/17/16 1811      SLP SHORT TERM GOAL #1   Title Patient will utilize intelligibility strategies with moderate visual cues from communication partner/visual aid to communicate 5-7 word wants, needs, thoughts and ideas.   Status Achieved     SLP SHORT TERM GOAL #2   Title Patient will utilize intelligibility strategies, breath support with mod A for phrase repetition in 75% of trials.   Status Achieved     SLP SHORT TERM GOAL #3   Title Pt will swallow thin liquids, purees and soft solids in 9/10 trials with no overt signs of aspiration.   Status Achieved     SLP SHORT TERM GOAL #4   Title Patient will tolerate 6oz water, >95% with no overt signs of aspiration.   Status Achieved     SLP SHORT TERM GOAL #5   Title Patient will utilize verbal cues for lip closure, dry swallow for secretion management in 75% of opportunities.   Status Achieved          SLP Long Term Goals - 06/22/16 1648      SLP LONG TERM GOAL #1   Title Patient will utilize multimodal communication/AAC to clarify wants, needs, thoughts or ideas during communication breakdowns with max A   Status Deferred     SLP LONG TERM GOAL #2   Title Patient will utilize intelligibility strategies, breath  support with min A visual cues for 5-7 word responses with 80% accuracy.   Baseline 06/17/16   Time 2   Period Weeks   Status On-going     SLP LONG TERM GOAL #3   Title Patient will demonstrate readiness for MBS to determine safety for pleasure feeds by timely initiation of swallow in 3/5 trials with ice chips/H20.   Status Achieved     SLP LONG TERM GOAL #4   Title Patient will adequately masticate and clear soft solids with mod verbal cues and no overt signs of aspiration in 4/5 trials.   Time 2   Period Weeks   Status On-going     SLP LONG TERM  GOAL #5   Title Patient will utilize visual cue for lip closure, dry swallow for secretion management in 75% of opportunities.   Baseline 5.7.18, 06/07/16   Time 2   Period Weeks   Status On-going          Plan - 06/22/16 1646    Clinical Impression Statement Patient is alert and attentive throughout session. Met goal for soft solids today however, suggest primary SLP check this goal at next session before marking achieved. Pt self-feeds approx 8 oz thin water, 7 bites of mechanical soft solid (with assistance and adaptive cup) requires occasional verbal and visual A for precuation. Pt noncompliant with recommended diet at home (had grilled cheese on Saturday and hamburger last night). Recommend cont' d skilled ST in order to maximize functional communication, swallowing safety, reduce caregiver burden and improve quality of life.   Speech Therapy Frequency 2x / week   Duration --  8 weeks   Treatment/Interventions Aspiration precaution training;Trials of upgraded texture/liquids;Compensatory strategies;Functional tasks;Patient/family education;Cueing hierarchy;Diet toleration management by SLP;Cognitive reorganization;Compensatory techniques;SLP instruction and feedback;Internal/external aids;Multimodal communcation approach   Potential to Achieve Goals Good      Patient will benefit from skilled therapeutic intervention in order to  improve the following deficits and impairments:   Dysphagia, oropharyngeal phase  Dysarthria and anarthria  Cognitive communication deficit    Problem List Patient Active Problem List   Diagnosis Date Noted  . Heterotopic ossification of bone 03/10/2016  . Acute on chronic respiratory failure with hypoxia ([facility])   . Acute hypoxemic respiratory failure ([facility])   . Acute blood loss anemia   . Atelectasis 02/04/2016  . Debility 02/03/2016  . Pneumonia of both lower lobes due to Pseudomonas species ([facility])   . Pneumonia of both lower lobes due to methicillin susceptible Staphylococcus aureus (MSSA) ([facility])   . Acute respiratory failure with hypoxia ([facility])   . Acute encephalopathy   . Seizures ([facility])   . Sepsis ([facility])   . Hypoxia   . Pain   . HCAP (healthcare-associated pneumonia) 12/28/2015  . Chronic respiratory failure ([facility]) 12/28/2015  . Acute tracheobronchitis 12/24/2015  . Tracheostomy tube present ([facility])   . Tracheal stenosis   . Chronic respiratory failure with hypoxia ([facility])   . 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 Tracheostomy status ([facility]) 09/26/2015  . Allergic rhinitis 09/26/2015  . Viral encephalitis 09/22/2015  . Movement disorder 09/22/2015  . Encephalitis 09/22/2015  . Chest pain 02/07/2014  . Premature atrial contractions 01/13/2014  . Abnormality of gait 12/04/2013  . Hyperlipidemia 12/21/2011  . Cardiovascular risk factor 12/21/2011  . Bunion 01/31/2008  . Metatarsalgia of both feet 09/20/2007  . FLAT FOOT 09/20/2007  . HALLUX RIGIDUS, ACQUIRED 09/20/2007    [facility] ,[place], [place]  06/22/2016, 4:50 PM  [facility] [address] [place], [place], [address] Phone: [phone]   Fax:  [phone]   Name: Judy Spears MRN: [id number] Date of Birth: February 04, 1950

## 2016-06-23 NOTE — Therapy (Signed)
[facility] [address] [place], [place], [address] Phone: [phone]   Fax:  [phone]  Physical Therapy Treatment  Patient Details  Name: Judy Spears MRN: [id number] Date of Birth: Jan 16, 1951 Referring Provider: Alger Simons, MD/Andrew Letta Pate, MD  Encounter Date: 06/22/2016      PT End of Session - 06/23/16 1551    Visit Number 30   Number of Visits 36   Date for PT Re-Evaluation 06/29/16   Authorization Type BC[employer]   PT Start Time 1450   PT Stop Time 1532   PT Time Calculation (min) 42 min      Past Medical History:  Diagnosis Date  . Arthritis    lt great toe  . Complication of anesthesia    pt has had headaches post op-did advise to hydra well preop  . Hair loss    right-sided  . History of exercise stress test    a. ETT (12/15) with 12:00 exercise, no ST changes, occasional PACs.   . Hyperlipidemia   . Insomnia   . Migraine headache   . Movement disorder    resltess in left legs  . Postmenopausal   . Premature atrial contractions    a. Holter (12/15) with 8% PACs, no atrial runs or atrial fibrillation.     Past Surgical History:  Procedure Laterality Date  . BREAST BIOPSY  01/01/2011   Procedure: BREAST BIOPSY WITH NEEDLE LOCALIZATION;  Surgeon: Rolm Bookbinder, MD;  Location: [facility];  Service: General;  Laterality: Left;  left breast wire localization biopsy  . cataracts right eye    . CESAREAN SECTION  1986  . HERNIA REPAIR  2000   RIH  . opirectinal membrane peel    . RHINOPLASTY  1983    There were no vitals filed for this visit.      Subjective Assessment - 06/23/16 1543    Subjective Caregiver [name]) reports she used Stedy backwards for assistive device for pt to amb. at home -- used it on carpet to ensure pt to go slowly and not too fast   Patient is accompained by: --  caregiver [name]   Pertinent History Precautions:  PEG.  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  Another round of botox in LLE on 02/06/16   Limitations House hold activities;Walking;Standing;Sitting   Patient Stated Goals Per husband, "For [name] to be as independent as possible."   Currently in Pain? No/denies                         [facility] Adult PT Treatment/Exercise - 06/23/16 0001      Bed Mobility   Rolling Left 4: Min assist   Left Sidelying to Sit 4: Min assist     Transfers   Transfers Sit to Stand   Sit to Stand 4: Min assist   Sit to Stand Details Tactile cues for placement;Verbal cues for technique;Manual facilitation for weight shifting;Verbal cues for gait pattern   Sit to Stand Details (indicate cue type and reason) cues to lean anteriorly   Stand to Sit 4: Min assist;3: Mod assist   Stand to Sit Details (indicate cue type and reason) Verbal cues for technique;Tactile cues for placement   Stand to Sit Details cues to flex at hips and to position  hips far back     Ambulation/Gait   Ambulation/Gait Yes   Ambulation/Gait Assistance 4: Min assist;3: Mod assist   Ambulation/Gait Assistance Details hands on Rt and Lt rib cage/trunk to facilitate extension na dmore upright turnk posture; cues to tuck hips prior to stepping   Ambulation Distance (Feet) 140 Feet   Assistive device Bilateral platform walker   Gait Pattern Decreased stance time - right;Decreased hip/knee flexion - right;Decreased hip/knee flexion - left;Decreased weight shift to right;Scissoring;Ataxic   Ambulation Surface Level;Indoor   Gait Comments mod assist to keep Lt trunk elevated by placing hand on  rib cage near axilla region      TherEx:  Trunk rotation to Rt and Lt sides 2 reps each 30 sec hold Sitting on side of mat - pushing green therapy ball forward to facilitate trunk flexion -  30 sec hold Rolling to Rt side, holding 20 secs:  Rolling to Lt side holding 20 secs for trunk rotation stretch  NeuroRe-ed:  Standing at side of mat - holding onto PT with bil. UE support - weight shifting laterally 10 reps Stepping laterally 5 reps with RLE and then 5 reps with LLE  Stepping forward/back with RLE/LLE 5 reps each leg with min to mod assist            PT Short Term Goals - 05/28/16 1522      PT SHORT TERM GOAL #1   Title Pt will consistently perform supine rolling in B directions at S level (with rails as needed) in order to indicate improved trunk dissociation and increaed independence with bed mobility.  (Target Date for STGs: 05/28/16)   Baseline min assist needed per husband's report - 05-24-16   Time 4   Period Weeks   Status On-going     PT SHORT TERM GOAL #2   Title Pt will consistently perform R SL>sit at mod A level in order to indicate increased independence with getting out of bed.     Baseline inconsistently met per husband's report - 05-27-16   Time 4   Period Weeks   Status On-going     PT SHORT TERM GOAL #3   Title Pt will consistently perform L SL>sit at min A level in order to indicate increased independene with getting out of bed/off of exercise mat table.    Baseline mod to min assist needed - 05-27-16   Status Partially Met     PT SHORT TERM GOAL #4   Title Pt will consistently perform sit to SL R and L at mod A level in order to indicate improved independence with bed mobility.     Baseline met 05-28-16   Status Achieved     PT SHORT TERM GOAL #5   Title Pt will perform transfers from w/c to seated surface at min/guard level in order to indicate improved independence with transfers.    Baseline mod assist needed - 05-24-16   Status On-going     PT SHORT TERM GOAL #6   Title Pt will perform static standing with BUE support x 3 mins at S level with no overt LOB to decrease burden of care and increase independence with ADLs.    Baseline Pt  able to stand for 3" but amount of assist varies - supervision to mod assist depending on fatigue - 05-28-16   Time 4   Period Weeks   Status On-going     PT SHORT TERM GOAL #7   Title Pt will perform dynamic  sitting at [facility] with intermittent UE support, reaching outside of BOS x 5 mins in order to indicate safety with ADLs.    Baseline met 05-24-16   Status Achieved           PT Long Term Goals - 06/23/16 1555      PT LONG TERM GOAL #1   Title Pt will perform supine rolling R and L at mod I level in order to indicate improved independence with bed mobility.  (Target Date: 06/25/16)   Baseline Requires varying amounts of assist from min to max A,  see bed mobility section for details.  04/29/16   Status New     PT LONG TERM GOAL #2   Title Pt will perform sit>SL R or L at min A level in order to indicate improved independence with getting into bed.    Status New     PT LONG TERM GOAL #3   Title Pt will perform SL R to sit at min A level, SL L to sit at S level in order to indicate increased independence with getting out of bed.     Baseline Consistently performing stand pivot transfers at min A level at least 75% of time 04/29/16   Status New     PT LONG TERM GOAL #4   Title Pt will perform transfers at S level 50% of the time in order to indicate improved functional independence.    Status New     PT LONG TERM GOAL #5   Title Pt will be able to stand with single UE support while performing opposite UE task x 3 mins at S level in order to indicate improved independence with ADLs.    Status New     PT LONG TERM GOAL #6   Title Pt will demonstrate ability to ambulate up to 25' w/ LRAD at min A level in order to assist with getting around home.     Status New               Plan - 06/23/16 1552    Clinical Impression Statement Pt used bilateral platform RW today to amb. extended distance (140') rather than UP walker:  RW had 3" wheels rather than 5" wheels so was difficult for  pt to push/maneuver at times; will try RW with 5" wheels    Rehab Potential Fair   Clinical Impairments Affecting Rehab Potential Chronicity of condition, continued medical management, but has very strong family and financial support   PT Frequency 2x / week   PT Duration 8 weeks   PT Treatment/Interventions ADLs/Self Care Home Management;DME Instruction;Gait training;Stair training;Functional mobility training;Therapeutic activities;Patient/family education;Cognitive remediation;Neuromuscular re-education;Therapeutic exercise;Balance training;Orthotic Fit/Training;Wheelchair mobility training;Manual techniques;Splinting;Visual/perceptual remediation/compensation;Vestibular;Passive range of motion;Electrical Stimulation   PT Next Visit Plan begin checking LTG's for renewal   Consulted and Agree with Plan of Care Patient   Family Member Consulted caregiver, [name] and husband [name]      Patient will benefit from skilled therapeutic intervention in order to improve the following deficits and impairments:  Abnormal gait, Decreased balance, Decreased endurance, Decreased cognition, Cardiopulmonary status limiting activity, Decreased activity tolerance, Decreased coordination, Decreased strength, Impaired flexibility, Impaired tone, Decreased mobility, Increased muscle spasms, Impaired vision/preception, Decreased range of motion, Impaired UE functional use, Postural dysfunction, Improper body mechanics, Impaired perceived functional ability, Decreased knowledge of precautions, Decreased knowledge of use of DME  Visit Diagnosis: Other abnormalities of gait and mobility  Muscle weakness (generalized)     Problem List Patient Active  Problem List   Diagnosis Date Noted  . Heterotopic ossification of bone 03/10/2016  . Acute on chronic respiratory failure with hypoxia ([facility])   . Acute hypoxemic respiratory failure ([facility])   . Acute blood loss anemia   . Atelectasis 02/04/2016  . Debility 02/03/2016   . Pneumonia of both lower lobes due to Pseudomonas species ([facility] [place])   . Pneumonia of both lower lobes due to methicillin susceptible Staphylococcus aureus (MSSA) ([place])   . Acute respiratory failure with hypoxia ([facility])   . Acute encephalopathy   . Seizures ([facility] [place])   . Sepsis ([facility])   . Hypoxia   . Pain   . HCAP (healthcare-associated pneumonia) 12/28/2015  . Chronic respiratory failure ([facility]) 12/28/2015  . Acute tracheobronchitis 12/24/2015  . Tracheostomy tube present ([facility])   . Tracheal stenosis   . Chronic respiratory failure with hypoxia ([facility])   . 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[place]) 10/20/2015  . Tracheostomy status ([place]) 09/26/2015  . Allergic rhinitis 09/26/2015  . Viral encephalitis 09/22/2015  . Movement disorder 09/22/2015  . Encephalitis 09/22/2015  . Chest pain 02/07/2014  . Premature atrial contractions 01/13/2014  . Abnormality of gait 12/04/2013  . Hyperlipidemia 12/21/2011  . Cardiovascular risk factor 12/21/2011  . Bunion 01/31/2008  . Metatarsalgia of both feet 09/20/2007  . FLAT FOOT 09/20/2007  . HALLUX RIGIDUS, ACQUIRED 09/20/2007    Alda Lea, PT 06/23/2016, 3:59 PM  [facility] [address] [place], [place], [address] Phone: [phone]   Fax:  [phone]  Name: Judy Spears MRN: [id number] Date of Birth: [birth year]

## 2016-06-24 ENCOUNTER — Encounter: Payer: Self-pay | Admitting: Occupational Therapy

## 2016-06-24 ENCOUNTER — Ambulatory Visit: Payer: Medicare Other | Admitting: Physical Therapy

## 2016-06-24 ENCOUNTER — Ambulatory Visit: Payer: Medicare Other | Admitting: Speech Pathology

## 2016-06-24 ENCOUNTER — Other Ambulatory Visit: Payer: Self-pay | Admitting: Physical Medicine & Rehabilitation

## 2016-06-24 ENCOUNTER — Ambulatory Visit: Payer: Medicare Other | Admitting: Occupational Therapy

## 2016-06-24 DIAGNOSIS — R471 Dysarthria and anarthria: Secondary | ICD-10-CM

## 2016-06-24 DIAGNOSIS — R29818 Other symptoms and signs involving the nervous system: Secondary | ICD-10-CM

## 2016-06-24 DIAGNOSIS — R41841 Cognitive communication deficit: Secondary | ICD-10-CM

## 2016-06-24 DIAGNOSIS — R1312 Dysphagia, oropharyngeal phase: Secondary | ICD-10-CM

## 2016-06-24 DIAGNOSIS — M25611 Stiffness of right shoulder, not elsewhere classified: Secondary | ICD-10-CM

## 2016-06-24 DIAGNOSIS — M25622 Stiffness of left elbow, not elsewhere classified: Secondary | ICD-10-CM

## 2016-06-24 DIAGNOSIS — R2689 Other abnormalities of gait and mobility: Secondary | ICD-10-CM

## 2016-06-24 DIAGNOSIS — R41844 Frontal lobe and executive function deficit: Secondary | ICD-10-CM

## 2016-06-24 DIAGNOSIS — M25612 Stiffness of left shoulder, not elsewhere classified: Secondary | ICD-10-CM

## 2016-06-24 DIAGNOSIS — R2681 Unsteadiness on feet: Secondary | ICD-10-CM

## 2016-06-24 DIAGNOSIS — R293 Abnormal posture: Secondary | ICD-10-CM

## 2016-06-24 DIAGNOSIS — M6281 Muscle weakness (generalized): Secondary | ICD-10-CM

## 2016-06-24 NOTE — Telephone Encounter (Signed)
Pt husband returned call and I let him know that her cxr looked ok and he was fine with that.Hillery Hunter'

## 2016-06-24 NOTE — Therapy (Signed)
[facility] [address] [place], [place], [address] Phone: [phone]   Fax:  [phone]  Occupational Therapy Treatment  Patient Details  Name: Judy Spears MRN: [id number] Date of Birth: 20-Jul-1950 Referring Provider: Dr. Quita Skye. [name]  Encounter Date: 06/24/2016      OT End of Session - 06/24/16 1600    Visit Number 30   Number of Visits 32   Date for OT Re-Evaluation 07/08/16   Authorization Type BC[employer] unlimited visits   OT Start Time 1316   OT Stop Time 1401   OT Time Calculation (min) 45 min   Activity Tolerance Patient tolerated treatment well      Past Medical History:  Diagnosis Date  . Arthritis    lt great toe  . Complication of anesthesia    pt has had headaches post op-did advise to hydra well preop  . Hair loss    right-sided  . History of exercise stress test    a. ETT (12/15) with 12:00 exercise, no ST changes, occasional PACs.   . Hyperlipidemia   . Insomnia   . Migraine headache   . Movement disorder    resltess in left legs  . Postmenopausal   . Premature atrial contractions    a. Holter (12/15) with 8% PACs, no atrial runs or atrial fibrillation.     Past Surgical History:  Procedure Laterality Date  . BREAST BIOPSY  01/01/2011   Procedure: BREAST BIOPSY WITH NEEDLE LOCALIZATION;  Surgeon: Rolm Bookbinder, MD;  Location: [facility];  Service: General;  Laterality: Left;  left breast wire localization biopsy  . cataracts right eye    . CESAREAN SECTION  1986  . HERNIA REPAIR  2000   RIH  . opirectinal membrane peel    . RHINOPLASTY  1983    There were no vitals filed for this visit.      Subjective Assessment - 06/24/16 1321    Subjective  Hey (outloud in response to hello)   Patient is accompained by: Family member  husband [name] and caregiver Leesa   Pertinent History see epic snapshot - ABI/encephalitis/hypoxia from virus; recent hospitalization  due to ARF;    Patient Stated Goals Pt unable to state.    Currently in Pain? No/denies                      OT Treatments/Exercises (OP) - 06/24/16 0001      Neurological Re-education Exercises   Other Exercises 1 Neuro re ed after  manual techiques to encourage diaphramic and deep belly breathing with controlled forceful expiration to blow and verbalize single words.  Pt requires max facilitation as well as max cues but with repetition demonstrates some limited improvement. Caregiver and husband instructed in simple techniques to work on at home - able to return demonstrate.  Also addressed active transfers wheelchair to mat - pt today required mod a for squat pivot transfers.       Manual Therapy   Manual Therapy Soft tissue mobilization;Myofascial release   Manual therapy comments soft tissue and myofascial release to address trunk musculature to open space between rib cage and hips to facilitate deep breathing in order to improve standing/sitting posture, louder vocalizations, more forceful cough and improved breath support.                  OT Education - 06/24/16 1557    Education provided Yes   Education Details activities to  facilitate improved breath support   Person(s) Educated Patient;Caregiver(s)   Methods Explanation;Demonstration;Verbal cues   Comprehension Verbalized understanding;Returned demonstration  caregiver able to return demonstrate          OT Short Term Goals - 06/24/16 1558      OT SHORT TERM GOAL #1   Title Pt, husband and cargiver will be mod I with HEP - 03/29/2016   Status Achieved     OT SHORT TERM GOAL #2   Title Pt will be able to sit with supervision without UE support and vc's to assist with midline 75% while engage in functional task   Status Achieved     OT [place] #3   Title Pt will be able to complete sit to stand consistently with mod A for toilet transfers (max a for pivot)   Status Achieved     OT SHORT  TERM GOAL #4   Title Pt, husband and caregiver will verbalize understanding of various bathroom accomodations to reduce long term caregiver burden   Status Achieved     OT SHORT TERM GOAL #5   Title Pt will be able to stand step transfer to toilet with mo more than mod a, mod vc's consistently using grab bar for community toileting needs- 06/10/2016 date adjusted as pt missed 2 weeks due to hospitalization   Status Achieved     OT SHORT TERM GOAL #6   Title Pt will be able to wash face with supervision/max cues 50% of the time, min a 50% of the time.    Status Achieved     OT SHORT TERM GOAL #7   Title Pt will be able to don pull over shirt with mod a, max cues   Status Achieved     OT SHORT TERM GOAL #8   Title Pt will be able to maintain static standing with UE support with close supervision to reduce burden of care for caregivers    Status Achieved           OT Long Term Goals - 06/24/16 1558      OT LONG TERM GOAL #1   Title Pt, husband and caregiver will be mod I with upgraded HEP prn - 04/26/2016   Status Achieved     OT LONG TERM GOAL #2   Title Pt, husband and caregiver will be mod I with splint wear and care    Status Achieved     OT LONG TERM GOAL #3   Title Pt will be able to wash face with min a only   Status Achieved     OT LONG TERM GOAL #4   Title Pt will be able to sit without UE support with supevision 100% of the time with no more than 2 vc's while engaged in functional activity.   Status Achieved     OT LONG TERM GOAL #5   Title Pt, husband and caregiver will verbalize understanding of potential after care therapeutic opportunities. - 07/08/2016 date adjusted as pt missed 2 weeks due to hospitalization   Status On-going     OT LONG TERM GOAL #6   Title Pt will be supervision to wash face consistently   Status On-going     OT LONG TERM GOAL #7   Title Pt will don shirt with min a and max vc's 50% of the time   Status On-going     OT LONG TERM GOAL  #8   Title Pt will require min a for stand  step transfers to toilet using grab bar for community toileting at least 75% of the time.   Status On-going     OT LONG TERM GOAL  #9   Baseline Pt will stand with contact gurard with only 1 UE support for static standing balance in prep for abiilty to engage in functional task while standing.    Status Achieved               Plan - 06/24/16 1559    Clinical Impression Statement Pt with slow but steady progression toward goals.  Pt does vary from session to session however overall basic mobility is improving   Rehab Potential Fair   Current Impairments/barriers affecting progress: severity of deficits, very slow rate of recovery   OT Frequency 2x / week   OT Duration 8 weeks   OT Treatment/Interventions Self-care/ADL training;Ultrasound;Moist Heat;Electrical Stimulation;DME and/or AE instruction;Neuromuscular education;Therapeutic exercise;Functional Mobility Training;Manual Therapy;Passive range of motion;Splinting;Therapeutic activities;Balance training;Patient/family education;Visual/perceptual remediation/compensation;Cognitive remediation/compensation   Plan NMR for trunk, functional use of UE's, static to dynamic standing balance, postural control with one UE support   Consulted and Agree with Plan of Care Patient;Family member/caregiver   Family Member Consulted caregiver [name] , husband [name]      Patient will benefit from skilled therapeutic intervention in order to improve the following deficits and impairments:  Decreased endurance, Decreased skin integrity, Impaired vision/preception, Improper body mechanics, Impaired perceived functional ability, Decreased knowledge of precautions, Cardiopulmonary status limiting activity, Decreased activity tolerance, Decreased knowledge of use of DME, Decreased strength, Impaired flexibility, Improper spinal/pelvic alignment, Impaired sensation, Difficulty walking, Decreased mobility, Decreased  balance, Decreased cognition, Decreased range of motion, Impaired tone, Impaired UE functional use, Decreased safety awareness, Decreased coordination, Pain  Visit Diagnosis: Abnormal posture  Muscle weakness (generalized)  Other symptoms and signs involving the nervous system  Unsteadiness on feet  Stiffness of right shoulder, not elsewhere classified  Stiffness of left shoulder, not elsewhere classified  Stiffness of left elbow, not elsewhere classified  Frontal lobe and executive function deficit    Problem List Patient Active Problem List   Diagnosis Date Noted  . Heterotopic ossification of bone 03/10/2016  . Acute on chronic respiratory failure with hypoxia ([place])   . Acute hypoxemic respiratory failure ([place])   . Acute blood loss anemia   . Atelectasis 02/04/2016  . Debility 02/03/2016  . Pneumonia of both lower lobes due to Pseudomonas species ([place])   . Pneumonia of both lower lobes due to methicillin susceptible Staphylococcus aureus (MSSA) ([place])   . Acute respiratory failure with hypoxia ([place])   . Acute encephalopathy   . Seizures ([place])   . Sepsis ([place])   . Hypoxia   . Pain   . HCAP (healthcare-associated pneumonia) 12/28/2015  . Chronic respiratory failure ([place]) 12/28/2015  . Acute tracheobronchitis 12/24/2015  . Tracheostomy tube present ([place])   . Tracheal stenosis   . Chronic respiratory failure with hypoxia ([place])   . 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[place]) 10/20/2015  . Tracheostomy status ([place]) 09/26/2015  . Allergic rhinitis 09/26/2015  . Viral encephalitis 09/22/2015  . Movement disorder 09/22/2015  . Encephalitis 09/22/2015  . Chest pain 02/07/2014  . Premature atrial contractions 01/13/2014  . Abnormality of gait 12/04/2013  . Hyperlipidemia 12/21/2011  . Cardiovascular risk factor 12/21/2011  . Bunion 01/31/2008  . Metatarsalgia of both feet 09/20/2007  . FLAT FOOT 09/20/2007  . HALLUX RIGIDUS, ACQUIRED 09/20/2007    Quay Burow  ,OTR/L 06/24/2016, 4:02 PM  [facility]  [facility] [address] [place], [place], [address] Phone: [phone]   Fax:  [phone]  Name: Judy Spears MRN: [id number] Date of Birth: [birth year]

## 2016-06-24 NOTE — Therapy (Signed)
[facility] [address] [place], [place], [address] Phone: [phone]   Fax:  [phone]  Physical Therapy Treatment  Patient Details  Name: Judy Spears MRN: [id number] Date of Birth: Sep 10, 1950 Referring Provider: Alger Simons, MD/Andrew Letta Pate, MD  Encounter Date: 06/24/2016      PT End of Session - 06/23/16 1551    Visit Number 30   Number of Visits 36   Date for PT Re-Evaluation 06/29/16   Authorization Type BCBS   PT Start Time 1450   PT Stop Time 1532   PT Time Calculation (min) 42 min      Past Medical History:  Diagnosis Date  . Arthritis    lt great toe  . Complication of anesthesia    pt has had headaches post op-did advise to hydra well preop  . Hair loss    right-sided  . History of exercise stress test    a. ETT (12/15) with 12:00 exercise, no ST changes, occasional PACs.   . Hyperlipidemia   . Insomnia   . Migraine headache   . Movement disorder    resltess in left legs  . Postmenopausal   . Premature atrial contractions    a. Holter (12/15) with 8% PACs, no atrial runs or atrial fibrillation.     Past Surgical History:  Procedure Laterality Date  . BREAST BIOPSY  01/01/2011   Procedure: BREAST BIOPSY WITH NEEDLE LOCALIZATION;  Surgeon: Rolm Bookbinder, MD;  Location: [facility];  Service: General;  Laterality: Left;  left breast wire localization biopsy  . cataracts right eye    . CESAREAN SECTION  1986  . HERNIA REPAIR  2000   RIH  . opirectinal membrane peel    . RHINOPLASTY  1983    There were no vitals filed for this visit.      Subjective Assessment - 06/24/16 2133    Subjective Caregiver reports she did not give pt water so that she could lie down for bed mobility assessment; states that they stand to work on balance at home - in bathroom as that is good place to work on finding center   Pertinent History Precautions:  PEG.  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  Another round of botox in LLE on 02/06/16   Limitations House hold activities;Walking;Standing;Sitting   Patient Stated Goals Per husband, "For [name] to be as independent as possible."   Currently in Pain? No/denies                                   PT Short Term Goals - 05/28/16 1522      PT SHORT TERM GOAL #1   Title Pt will consistently perform supine rolling in B directions at S level (with rails as needed) in order to indicate improved trunk dissociation and increaed independence with bed mobility.  (Target Date for STGs: 05/28/16)   Baseline min assist needed per husband's report - 05-24-16   Time 4   Period Weeks   Status On-going     PT SHORT TERM GOAL #2   Title Pt will consistently perform R SL>sit at mod A level in order to indicate increased independence with getting out of bed.     Baseline inconsistently  met per husband's report - 05-27-16   Time 4   Period Weeks   Status On-going     PT SHORT TERM GOAL #3   Title Pt will consistently perform L SL>sit at min A level in order to indicate increased independene with getting out of bed/off of exercise mat table.    Baseline mod to min assist needed - 05-27-16   Status Partially Met     PT SHORT TERM GOAL #4   Title Pt will consistently perform sit to SL R and L at mod A level in order to indicate improved independence with bed mobility.     Baseline met 05-28-16   Status Achieved     PT SHORT TERM GOAL #5   Title Pt will perform transfers from w/c to seated surface at min/guard level in order to indicate improved independence with transfers.    Baseline mod assist needed - 05-24-16   Status On-going     PT SHORT TERM GOAL #6   Title Pt will perform static standing with BUE support x 3 mins at S  level with no overt LOB to decrease burden of care and increase independence with ADLs.    Baseline Pt able to stand for 3" but amount of assist varies - supervision to mod assist depending on fatigue - 05-28-16   Time 4   Period Weeks   Status On-going     PT SHORT TERM GOAL #7   Title Pt will perform dynamic sitting at [facility] with intermittent UE support, reaching outside of BOS x 5 mins in order to indicate safety with ADLs.    Baseline met 05-24-16   Status Achieved           PT Long Term Goals - 06/23/16 1555      PT LONG TERM GOAL #1   Title Pt will perform supine rolling R and L at mod I level in order to indicate improved independence with bed mobility.  (Target Date: 06/25/16)   Baseline Requires varying amounts of assist from min to max A,  see bed mobility section for details.  04/29/16   Status New     PT LONG TERM GOAL #2   Title Pt will perform sit>SL R or L at min A level in order to indicate improved independence with getting into bed.    Status New     PT LONG TERM GOAL #3   Title Pt will perform SL R to sit at min A level, SL L to sit at S level in order to indicate increased independence with getting out of bed.     Baseline Consistently performing stand pivot transfers at min A level at least 75% of time 04/29/16   Status New     PT LONG TERM GOAL #4   Title Pt will perform transfers at S level 50% of the time in order to indicate improved functional independence.    Status New     PT LONG TERM GOAL #5   Title Pt will be able to stand with single UE support while performing opposite UE task x 3 mins at S level in order to indicate improved independence with ADLs.    Status New     PT LONG TERM GOAL #6   Title Pt will demonstrate ability to ambulate up to 25' w/ LRAD at min A level in order to assist with getting around home.     Status New  Plan - 06/23/16 1552    Clinical Impression Statement Pt used bilateral platform RW today to amb.  extended distance (140') rather than UP walker:  RW had 3" wheels rather than 5" wheels so was difficult for pt to push/maneuver at times; will try RW with 5" wheels    Rehab Potential Fair   Clinical Impairments Affecting Rehab Potential Chronicity of condition, continued medical management, but has very strong family and financial support   PT Frequency 2x / week   PT Duration 8 weeks   PT Treatment/Interventions ADLs/Self Care Home Management;DME Instruction;Gait training;Stair training;Functional mobility training;Therapeutic activities;Patient/family education;Cognitive remediation;Neuromuscular re-education;Therapeutic exercise;Balance training;Orthotic Fit/Training;Wheelchair mobility training;Manual techniques;Splinting;Visual/perceptual remediation/compensation;Vestibular;Passive range of motion;Electrical Stimulation   PT Next Visit Plan begin checking LTG's for renewal   Consulted and Agree with Plan of Care Patient   Family Member Consulted caregiver, [name] and husband [name]      Patient will benefit from skilled therapeutic intervention in order to improve the following deficits and impairments:     Visit Diagnosis: Other symptoms and signs involving the nervous system  Other abnormalities of gait and mobility     Problem List Patient Active Problem List   Diagnosis Date Noted  . Heterotopic ossification of bone 03/10/2016  . Acute on chronic respiratory failure with hypoxia ([place])   . Acute hypoxemic respiratory failure ([place])   . Acute blood loss anemia   . Atelectasis 02/04/2016  . Debility 02/03/2016  . Pneumonia of both lower lobes due to Pseudomonas species ([place])   . Pneumonia of both lower lobes due to methicillin susceptible Staphylococcus aureus (MSSA) ([place])   . Acute respiratory failure with hypoxia ([place])   . Acute encephalopathy   . Seizures ([place])   . Sepsis ([place])   . Hypoxia   . Pain   . HCAP (healthcare-associated pneumonia) 12/28/2015  . Chronic  respiratory failure ([place]) 12/28/2015  . Acute tracheobronchitis 12/24/2015  . Tracheostomy tube present ([place])   . Tracheal stenosis   . Chronic respiratory failure with hypoxia ([place])   . 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[place]) 10/20/2015  . Tracheostomy status ([place]) 09/26/2015  . Allergic rhinitis 09/26/2015  . Viral encephalitis 09/22/2015  . Movement disorder 09/22/2015  . Encephalitis 09/22/2015  . Chest pain 02/07/2014  . Premature atrial contractions 01/13/2014  . Abnormality of gait 12/04/2013  . Hyperlipidemia 12/21/2011  . Cardiovascular risk factor 12/21/2011  . Bunion 01/31/2008  . Metatarsalgia of both feet 09/20/2007  . FLAT FOOT 09/20/2007  . HALLUX RIGIDUS, ACQUIRED 09/20/2007    Alda Lea, PT 06/24/2016, 9:38 PM  [facility] [address] [place], [place], [address] Phone: [phone]   Fax:  [phone]  Name: DORRIS PIERRE MRN: [id number] Date of Birth: 1950-06-01

## 2016-06-24 NOTE — Telephone Encounter (Signed)
Will close encounter

## 2016-06-24 NOTE — Therapy (Signed)
[facility] [address] [place], [place], [address] Phone: [phone]   Fax:  [phone]  Speech Language Pathology Treatment  Patient Details  Name: Judy Spears MRN: [id number] Date of Birth: [birth year] Referring Provider: Charlett Blake, MD  Encounter Date: 06/24/2016      End of Session - 06/24/16 1918    Visit Number 30   Number of Visits 33   Date for SLP Re-Evaluation 06/25/16   SLP Start Time 1448   SLP Stop Time  1535   SLP Time Calculation (min) 47 min   Activity Tolerance Patient tolerated treatment well      Past Medical History:  Diagnosis Date  . Arthritis    lt great toe  . Complication of anesthesia    pt has had headaches post op-did advise to hydra well preop  . Hair loss    right-sided  . History of exercise stress test    a. ETT (12/15) with 12:00 exercise, no ST changes, occasional PACs.   . Hyperlipidemia   . Insomnia   . Migraine headache   . Movement disorder    resltess in left legs  . Postmenopausal   . Premature atrial contractions    a. Holter (12/15) with 8% PACs, no atrial runs or atrial fibrillation.     Past Surgical History:  Procedure Laterality Date  . BREAST BIOPSY  01/01/2011   Procedure: BREAST BIOPSY WITH NEEDLE LOCALIZATION;  Surgeon: Rolm Bookbinder, MD;  Location: [facility];  Service: General;  Laterality: Left;  left breast wire localization biopsy  . cataracts right eye    . CESAREAN SECTION  1986  . HERNIA REPAIR  2000   RIH  . opirectinal membrane peel    . RHINOPLASTY  1983    There were no vitals filed for this visit.      Subjective Assessment - 06/24/16 1908    Subjective Pt ambulated with assist from PT with bilateral platform walker to today's ST session.   Patient is accompained by: Family member   Special Tests spouse, [name]; caregiver [name]   Currently in Pain? No/denies               ADULT SLP TREATMENT -  06/24/16 1445      General Information   Behavior/Cognition Alert;Cooperative;Pleasant mood   Patient Positioning Upright in chair   Oral care provided N/A     Treatment Provided   Treatment provided Cognitive-Linquistic     Pain Assessment   Pain Assessment No/denies pain     Cognitive-Linquistic Treatment   Treatment focused on Voice;Patient/family/caregiver education   Skilled Treatment Evaluated pt for inspiratory/expiratory RMT devices this session. Maximum Inspiratory Pressure 23 cm (LLN 29.5), Maximum Expiratory Pressure 23cm (LLN 58). Trained pt, husband, caregiver in purposes for and home exercise with RMT inspiratory and expiratory devices, benefits for swallowing and speech.       Tracheostomy Shiley 4 mm Uncuffed   Tracheostomy Properties Placement Date: 05/08/16 Placement Time: 1030 Placed By: ICU physician Brand: Shiley Size (mm): 4 mm Style: Uncuffed   Status Capped   Site Assessment Clean;Dry   Ties Assessment Clean;Dry;Secure     Assessment / Recommendations / Plan   Plan Continue with current plan of care     Dysphagia Recommendations   Diet recommendations Dysphagia 1 (puree);Thin liquid   Liquids provided via Teaspoon;Cup   Medication Administration Via alternative means   Supervision Full supervision/cueing for compensatory strategies   Compensations  Effortful swallow;Multiple dry swallows after each bite/sip;Check for anterior loss;Small sips/bites;Slow rate   Postural Changes and/or Swallow Maneuvers Seated upright 90 degrees     Progression Toward Goals   Progression toward goals Progressing toward goals     General Information   Reason PO's not observed Other (comment)  RMT assessment today          SLP Education - 06/24/16 1917    Education provided Yes   Education Details benefits of RMT, device use, home exercise   Person(s) Educated Patient;Spouse;Caregiver(s)   Methods Explanation;Demonstration;Verbal cues;Tactile cues   Comprehension  Verbalized understanding;Returned demonstration;Need further instruction;Verbal cues required          SLP Short Term Goals - 06/24/16 1919      SLP SHORT TERM GOAL #1   Title Patient will utilize intelligibility strategies with moderate visual cues from communication partner/visual aid to communicate 5-7 word wants, needs, thoughts and ideas.   Status Achieved     SLP SHORT TERM GOAL #2   Title Patient will utilize intelligibility strategies, breath support with mod A for phrase repetition in 75% of trials.   Status Achieved     SLP SHORT TERM GOAL #3   Title Pt will swallow thin liquids, purees and soft solids in 9/10 trials with no overt signs of aspiration.   Status Achieved     SLP SHORT TERM GOAL #4   Title Patient will tolerate 6oz water, >95% with no overt signs of aspiration.   Status Achieved     SLP SHORT TERM GOAL #5   Title Patient will utilize verbal cues for lip closure, dry swallow for secretion management in 75% of opportunities.   Status Achieved          SLP Long Term Goals - 06/24/16 1919      SLP LONG TERM GOAL #1   Title Patient will utilize multimodal communication/AAC to clarify wants, needs, thoughts or ideas during communication breakdowns with max A   Status Deferred     SLP LONG TERM GOAL #2   Title Patient will utilize intelligibility strategies, breath support with min A visual cues for 5-7 word responses with 80% accuracy.   Baseline 06/17/16   Time 1   Period Weeks   Status On-going     SLP LONG TERM GOAL #3   Title Patient will demonstrate readiness for MBS to determine safety for pleasure feeds by timely initiation of swallow in 3/5 trials with ice chips/H20.   Period Weeks   Status Achieved     SLP LONG TERM GOAL #4   Title Patient will adequately masticate and clear soft solids with mod verbal cues and no overt signs of aspiration in 4/5 trials.   Time 1   Period Weeks   Status On-going     SLP LONG TERM GOAL #5   Title  Patient will utilize visual cue for lip closure, dry swallow for secretion management in 75% of opportunities.   Baseline 5.7.18, 06/07/16   Time 1   Period Weeks   Status On-going          Plan - 06/24/16 1920    Clinical Impression Statement Patient is alert and attentive throughout session. Today's session targeted evaluation for appropriateness for respiratory muscle strength training exercises to improve swallowing and breath support for speech. Pt generates pressures below lower limits of normal for both inspiratory and expiratory tasks, and would benefit from training in use of devices to target strengthing via resistance exercises.  Pt has been making steady progress toward goals. Family state they have been advancing her diet at home; current recommendation is dys 1 (puree) with thin liquids, though patient has been tolerating soft solids with no overt signs of aspiration in recent sessions. Provided education to pt/family re: recommendation for conservative advancement of solids given her recent respiratory setback (pneumonia following decannulation attempt), and diminished cough strength. She is a good candidate for RMT; anticipate with continued treatment she may improve cough function as well as breath support for speech. Will request recertification for an additional 4 weeks next session. Recommend cont' d skilled ST in order to maximize functional communication, swallowing safety, reduce caregiver burden and improve quality of life.   Speech Therapy Frequency 2x / week   Treatment/Interventions Aspiration precaution training;Trials of upgraded texture/liquids;Compensatory strategies;Functional tasks;Patient/family education;Cueing hierarchy;Diet toleration management by SLP;Cognitive reorganization;Compensatory techniques;SLP instruction and feedback;Internal/external aids;Multimodal communcation approach   Potential to Achieve Goals Good   Potential Considerations Ability to  learn/carryover information;Co-morbidities;Severity of impairments   SLP Home Exercise Plan Pleasure feeds purees, thin liquids, strict oral care before and after PO, monitor for signs of fatigue, aspiration   Consulted and Agree with Plan of Care Family member/caregiver;Patient   Family Member Consulted husband chip, caregiver lisa      Patient will benefit from skilled therapeutic intervention in order to improve the following deficits and impairments:   Cognitive communication deficit  Dysarthria and anarthria  Dysphagia, oropharyngeal phase      G-Codes - 2016/07/18 1929    Functional Assessment Tool Used NOMS, clinical judgment   Functional Limitations Spoken language expressive   Spoken Language Expression Current Status [id number]) At least 60 percent but less than 80 percent impaired, limited or restricted   Spoken Language Expression Goal Status (X7939) At least 40 percent but less than 60 percent impaired, limited or restricted      Problem List Patient Active Problem List   Diagnosis Date Noted  . Heterotopic ossification of bone 03/10/2016  . Acute on chronic respiratory failure with hypoxia ([facility])   . Acute hypoxemic respiratory failure ([facility])   . Acute blood loss anemia   . Atelectasis 02/04/2016  . Debility 02/03/2016  . Pneumonia of both lower lobes due to Pseudomonas species ([facility])   . Pneumonia of both lower lobes due to methicillin susceptible Staphylococcus aureus (MSSA) ([facility])   . Acute respiratory failure with hypoxia ([facility])   . Acute encephalopathy   . Seizures ([facility])   . Sepsis ([place])   . Hypoxia   . Pain   . HCAP (healthcare-associated pneumonia) 12/28/2015  . Chronic respiratory failure ([facility]) 12/28/2015  . Acute tracheobronchitis 12/24/2015  . Tracheostomy tube present ([facility])   . Tracheal stenosis   . Chronic respiratory failure with hypoxia ([place])   . 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[place] [place]) 10/20/2015  . Tracheostomy status ([facility]) 09/26/2015  . Allergic rhinitis  09/26/2015  . Viral encephalitis 09/22/2015  . Movement disorder 09/22/2015  . Encephalitis 09/22/2015  . Chest pain 02/07/2014  . Premature atrial contractions 01/13/2014  . Abnormality of gait 12/04/2013  . Hyperlipidemia 12/21/2011  . Cardiovascular risk factor 12/21/2011  . Bunion 01/31/2008  . Metatarsalgia of both feet 09/20/2007  . FLAT FOOT 09/20/2007  . HALLUX RIGIDUS, ACQUIRED 09/20/2007   Speech Therapy Progress Note  Dates of Reporting Period: 05/17/16 to 2016-07-18  Subjective: Pt has been seen for 30 ST visits focusing n swallowing and expressive language.   Objective Measurements: Pt continues to require cues for both areas of deficit.  Goal Update: Progressing toward all goals; see above  Plan: Continue plan as written; will request recertification for additional 8 visits next session, goals to be updated at time of renewal.  Reason Skilled Services are Required: Skilled services remain needed to cont to encourage swallow and expressive verbal communication.   Deneise Lever, [place], CCC-SLP Speech-Language Pathologist  Aliene Altes 06/24/2016, 7:30 PM  [facility] [address] [place], [place], [address] Phone: [phone]   Fax:  [phone]   Name: Judy Spears MRN: [id number] Date of Birth: 01/20/1951

## 2016-06-25 NOTE — Addendum Note (Signed)
Addended by: Lamar Benes on: 06/25/2016 12:22 PM   Modules accepted: Orders

## 2016-06-25 NOTE — Therapy (Signed)
[facility] [address] [place], [place], [address] Phone: [phone]   Fax:  [phone]  Physical Therapy Treatment  Patient Details  Name: Judy Spears MRN: [id number] Date of Birth: Oct 15, 1950 Referring Provider: Alger Simons, MD/Andrew Letta Pate, MD  Encounter Date: 06/24/2016      PT End of Session - 06/25/16 1141    Visit Number 31   Number of Visits 60   Date for PT Re-Evaluation 08/23/16   Authorization Type BC[employer]   PT Start Time 1405   PT Stop Time 1450   PT Time Calculation (min) 45 min      Past Medical History:  Diagnosis Date  . Arthritis    lt great toe  . Complication of anesthesia    pt has had headaches post op-did advise to hydra well preop  . Hair loss    right-sided  . History of exercise stress test    a. ETT (12/15) with 12:00 exercise, no ST changes, occasional PACs.   . Hyperlipidemia   . Insomnia   . Migraine headache   . Movement disorder    resltess in left legs  . Postmenopausal   . Premature atrial contractions    a. Holter (12/15) with 8% PACs, no atrial runs or atrial fibrillation.     Past Surgical History:  Procedure Laterality Date  . BREAST BIOPSY  01/01/2011   Procedure: BREAST BIOPSY WITH NEEDLE LOCALIZATION;  Surgeon: Rolm Bookbinder, MD;  Location: [facility];  Service: General;  Laterality: Left;  left breast wire localization biopsy  . cataracts right eye    . CESAREAN SECTION  1986  . HERNIA REPAIR  2000   RIH  . opirectinal membrane peel    . RHINOPLASTY  1983    There were no vitals filed for this visit.      Subjective Assessment - 06/24/16 2133    Subjective Caregiver reports she did not give pt water so that she could lie down for bed mobility assessment; states that they stand to work on balance at home - in bathroom as that is good place to work on finding center   Pertinent History Precautions:  PEG.  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  Another round of botox in LLE on 02/06/16   Limitations House hold activities;Walking;Standing;Sitting   Patient Stated Goals Per husband, "For [name] to be as independent as possible."   Currently in Pain? No/denies                         [facility] Adult PT Treatment/Exercise - 06/25/16 0001      Bed Mobility   Bed Mobility Rolling Right;Rolling Left;Supine to Sit   Rolling Right 5: Set up;4: Min guard   Rolling Right Details (indicate cue type and reason) cues to bend LLE and to turn head to Rt   Rolling Left 5: Supervision;5: Set up   Rolling Left Details (indicate cue type and reason) cues to bring RUE across chest, turn head to Lt and to flex RLE to assist with pushing for rolling   Left Sidelying to Sit 4: Min assist   Sitting - Scoot to Edge of Bed 3: Mod assist  mod assist for lateral weight shift to scoot each hip forwar  Transfers   Transfers Sit to Stand   Sit to Stand 4: Min assist;3: Mod assist  mod assist from low mat to RW   Sit to Stand Details Tactile cues for placement;Verbal cues for technique;Manual facilitation for weight shifting;Verbal cues for gait pattern   Sit to Stand Details (indicate cue type and reason) cues for foot positioning and to lean anteriorly   Stand to Sit 4: Min assist;3: Mod assist   Stand to Sit Details (indicate cue type and reason) Verbal cues for technique;Tactile cues for placement   Stand to Sit Details cues to flex at hips to lean forward; pt has improved controlled descent    Stand Pivot Transfers 4: Min assist;3: Mod assist     Ambulation/Gait   Ambulation/Gait Yes   Ambulation/Gait Assistance 4: Min assist;3: Mod assist   Ambulation/Gait Assistance Details PT's hands on pt's Rt and Lt thoracic rib cage to  facilitate elongation of trunk and upright posture during stepping/SLS of opposite leg   Ambulation Distance (Feet) 160 Feet   Assistive device Bilateral platform walker   Gait Pattern Decreased stance time - right;Decreased hip/knee flexion - right;Decreased hip/knee flexion - left;Decreased weight shift to right;Scissoring;Ataxic   Ambulation Surface Level;Indoor   Gait Comments mod assist to keep Lt trunk elevated by placing hand on  rib cage near axilla region     Self Care:  Discussed pt's current status, LTG's with pt, caregiver Misty Stanley and husband [name] with plan to renew (2x/week x 8 weeks)  Discussed need to switch out Vonna Kotyk back on current wheelchair for regular back for less support with upright sitting due to  Improvements achieved with trunk control/sitting balance  Discussed and agreed upon new updated LTG's; also discussed pt's ability to use bil. Platform at home rather than need for UP RW For assistance with amb. In home environment          PT Short Term Goals - 06/25/16 1205      PT SHORT TERM GOAL #1   Title Jay Back on pt's current manual wheelchair to be switched out to regular back; pt will demonstrate abilty to sit in wheelchair with upright with less supportive wheelchair back.  07-24-16   Baseline Jay Back on current wheelchair   Time 4   Period Weeks   Status New     PT SHORT TERM GOAL #2   Title Pt will perform sit to stand from wheelchair to RW with CGA.  07-24-16   Baseline min to mod assist needed with cues for positioning of feet and to lean forward - 06-24-16   Time 4   Period Weeks   Status New     PT SHORT TERM GOAL #3   Title Family to obtain bil. platform RW to begin assisting pt with ambulation in her home environment.  07-24-16   Baseline have been assessing ability to use bil. platform RW vs. UP walker   Time 4   Period Weeks   Status New     PT SHORT TERM GOAL #4   Title Family will report pt rolling from supine to Rt and Lt sides in  hospital bed at home with supervision without use of bed rail with cues for positioning of LE's.  07-24-16   Baseline pt needs min assist at times - activity just demonstrated by pt on 06-24-16   Time 4   Period Weeks   Status New     PT SHORT TERM GOAL #5   Title Basic  transfers from wheelchair to bed or commode with CGA.  07-24-16   Baseline min to mod assist needed - 06-24-16   Time 4   Period Weeks     PT SHORT TERM GOAL #6   Title Amb. 47' with bil. platform RW with CGA to begin amb. at home with assistance safely.  07-24-16   Baseline 115' with platform RW with min to mod assist   Time 4   Period Weeks   Status New           PT Long Term Goals - 06/25/16 1142      PT LONG TERM GOAL #1   Title Pt will perform supine rolling R and L at mod I level in order to indicate improved independence with bed mobility.  (Target Date: 06/25/16)/ 08-23-16  CONT. LTG til 08-23-16   Baseline inconsistent in performance - pt needs cues for LE flexion to assist with pushing for rolling and cues for UE positioning   Time 8   Period Weeks   Status On-going     PT LONG TERM GOAL #2   Title Pt will perform sit>SL R or L modified independently in order to indicate improved independence with getting into bed.   08-23-16   Baseline min to mod assist needed - 06-24-16   Time 8   Status New     PT LONG TERM GOAL #3   Title Pt will transfer Lt/Rt sidelying to sitting with CGA for incr. independence with getting out of bed.  08-23-16   Baseline min to mod assist needed -06-24-16   Time 8   Period Weeks   Status New     PT LONG TERM GOAL #4   Title Pt will perform basic transfers with CGA to indicate improved functional independence.  08-23-16   Baseline min assist needed - 06-24-16   Time 8   Period Weeks   Status New     PT LONG TERM GOAL #5   Title Pt will perform static standing with bil. UE support with distant supervision for >/= 10" for improved standing balance.  08-23-16   Baseline close SBA  to CGA needed - pt standing with bil UE support for 8-10" per caregiver's report -- 06-24-16   Time 8   Period Weeks   Status New     Additional Long Term Goals   Additional Long Term Goals Yes     PT LONG TERM GOAL #6   Title Pt will demonstrate ability to ambulate up to 25' w/ LRAD at min A level in order to assist with getting around home.  CONT LTG til 08-23-16; goal upgraded to 50'   Baseline bil. platform RW to be used in home   Time 8   Period Weeks   Status On-going     PT LONG TERM GOAL #7   Title Perform sit to stand and stand to sit to RW or to another object with CGA.  08-23-16   Baseline min assist needed - 06-24-16   Time 8   Period Weeks   Status New               Plan - 06/25/16 1223    Clinical Impression Statement Pt has not fully met LTG's but continues to make slow and steady gains with improvements noted in functional mobility including bed mobility, transfers and standing balance and gait.  Pt demonstrated ability to roll on mat with supervision and cues for UE and LE positioning  for first time on 06-24-16.  Pt now using bil. platform RW rather than UP walker this week for gait training.  Pt will benefit from continued skilled PT services to address deficits in mobility and strength and posture abnormlatiies.      Rehab Potential Fair   Clinical Impairments Affecting Rehab Potential Chronicity of condition, continued medical management, but has very strong family and financial support   PT Frequency 2x / week   PT Duration 8 weeks  renewal completed 2x/week for 8 more wks on 06-24-16   PT Next Visit Plan try technique for incr. upright posture with noodle behind back with use of straps around shoulders; try stretching supine on wedge for opening up rib cage - yoga block under hips if position tolerated   Consulted and Agree with Plan of Care Patient;Family member/caregiver   Family Member Consulted caregiver, [name] and husband [name]      Patient will benefit  from skilled therapeutic intervention in order to improve the following deficits and impairments:  Abnormal gait, Decreased balance, Decreased endurance, Decreased cognition, Cardiopulmonary status limiting activity, Decreased activity tolerance, Decreased coordination, Decreased strength, Impaired flexibility, Impaired tone, Decreased mobility, Increased muscle spasms, Impaired vision/preception, Decreased range of motion, Impaired UE functional use, Postural dysfunction, Improper body mechanics, Impaired perceived functional ability, Decreased knowledge of precautions, Decreased knowledge of use of DME  Visit Diagnosis: Other symptoms and signs involving the nervous system - Plan: PT plan of care cert/re-cert  Other abnormalities of gait and mobility - Plan: PT plan of care cert/re-cert     Problem List Patient Active Problem List   Diagnosis Date Noted  . Heterotopic ossification of bone 03/10/2016  . Acute on chronic respiratory failure with hypoxia ([place])   . Acute hypoxemic respiratory failure ([place])   . Acute blood loss anemia   . Atelectasis 02/04/2016  . Debility 02/03/2016  . Pneumonia of both lower lobes due to Pseudomonas species ([facility])   . Pneumonia of both lower lobes due to methicillin susceptible Staphylococcus aureus (MSSA) ([place])   . Acute respiratory failure with hypoxia ([place])   . Acute encephalopathy   . Seizures ([place])   . Sepsis ([place])   . Hypoxia   . Pain   . HCAP (healthcare-associated pneumonia) 12/28/2015  . Chronic respiratory failure ([place]) 12/28/2015  . Acute tracheobronchitis 12/24/2015  . Tracheostomy tube present ([facility])   . Tracheal stenosis   . Chronic respiratory failure with hypoxia ([place])   . 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[place]) 10/20/2015  . Tracheostomy status ([place]) 09/26/2015  . Allergic rhinitis 09/26/2015  . Viral encephalitis 09/22/2015  . Movement disorder 09/22/2015  . Encephalitis 09/22/2015  . Chest pain 02/07/2014  . Premature atrial contractions  01/13/2014  . Abnormality of gait 12/04/2013  . Hyperlipidemia 12/21/2011  . Cardiovascular risk factor 12/21/2011  . Bunion 01/31/2008  . Metatarsalgia of both feet 09/20/2007  . FLAT FOOT 09/20/2007  . HALLUX RIGIDUS, ACQUIRED 09/20/2007    Alda Lea, PT 06/25/2016, 12:29 PM  [facility] [address] [place], [place], [address] Phone: [phone]   Fax:  [phone]  Name: Judy Spears MRN: [id number] Date of Birth: 1950-03-06

## 2016-06-28 ENCOUNTER — Ambulatory Visit: Payer: Medicare Other | Attending: Physical Medicine & Rehabilitation | Admitting: Speech Pathology

## 2016-06-28 ENCOUNTER — Ambulatory Visit: Payer: Medicare Other | Admitting: Physical Therapy

## 2016-06-28 ENCOUNTER — Other Ambulatory Visit: Payer: Self-pay | Admitting: Family Medicine

## 2016-06-28 DIAGNOSIS — G8250 Quadriplegia, unspecified: Secondary | ICD-10-CM | POA: Insufficient documentation

## 2016-06-28 DIAGNOSIS — R2689 Other abnormalities of gait and mobility: Secondary | ICD-10-CM

## 2016-06-28 DIAGNOSIS — R1312 Dysphagia, oropharyngeal phase: Secondary | ICD-10-CM | POA: Insufficient documentation

## 2016-06-28 DIAGNOSIS — M25611 Stiffness of right shoulder, not elsewhere classified: Secondary | ICD-10-CM | POA: Diagnosis present

## 2016-06-28 DIAGNOSIS — M25612 Stiffness of left shoulder, not elsewhere classified: Secondary | ICD-10-CM | POA: Insufficient documentation

## 2016-06-28 DIAGNOSIS — M6281 Muscle weakness (generalized): Secondary | ICD-10-CM | POA: Insufficient documentation

## 2016-06-28 DIAGNOSIS — R2681 Unsteadiness on feet: Secondary | ICD-10-CM | POA: Insufficient documentation

## 2016-06-28 DIAGNOSIS — R278 Other lack of coordination: Secondary | ICD-10-CM | POA: Insufficient documentation

## 2016-06-28 DIAGNOSIS — R293 Abnormal posture: Secondary | ICD-10-CM | POA: Diagnosis present

## 2016-06-28 DIAGNOSIS — R41841 Cognitive communication deficit: Secondary | ICD-10-CM | POA: Diagnosis present

## 2016-06-28 DIAGNOSIS — R41844 Frontal lobe and executive function deficit: Secondary | ICD-10-CM | POA: Insufficient documentation

## 2016-06-28 DIAGNOSIS — M25622 Stiffness of left elbow, not elsewhere classified: Secondary | ICD-10-CM | POA: Diagnosis present

## 2016-06-28 DIAGNOSIS — N63 Unspecified lump in unspecified breast: Secondary | ICD-10-CM

## 2016-06-28 DIAGNOSIS — R471 Dysarthria and anarthria: Secondary | ICD-10-CM | POA: Diagnosis not present

## 2016-06-28 DIAGNOSIS — R29818 Other symptoms and signs involving the nervous system: Secondary | ICD-10-CM | POA: Diagnosis present

## 2016-06-28 NOTE — Therapy (Signed)
[facility] [address] [place], [place], [address] Phone: [phone]   Fax:  [phone]  Physical Therapy Treatment  Patient Details  Name: Judy Spears MRN: [id number] Date of Birth: [birth year] Referring Provider: Alger Simons, MD/Andrew Letta Pate, MD  Encounter Date: 06/28/2016      PT End of Session - 06/28/16 2221    Visit Number 32   Number of Visits 83   Date for PT Re-Evaluation 08/23/16   Authorization Type BCBS   PT Start Time 1536   PT Stop Time 1616   PT Time Calculation (min) 40 min      Past Medical History:  Diagnosis Date  . Arthritis    lt great toe  . Complication of anesthesia    pt has had headaches post op-did advise to hydra well preop  . Hair loss    right-sided  . History of exercise stress test    a. ETT (12/15) with 12:00 exercise, no ST changes, occasional PACs.   . Hyperlipidemia   . Insomnia   . Migraine headache   . Movement disorder    resltess in left legs  . Postmenopausal   . Premature atrial contractions    a. Holter (12/15) with 8% PACs, no atrial runs or atrial fibrillation.     Past Surgical History:  Procedure Laterality Date  . BREAST BIOPSY  01/01/2011   Procedure: BREAST BIOPSY WITH NEEDLE LOCALIZATION;  Surgeon: Rolm Bookbinder, MD;  Location: [facility];  Service: General;  Laterality: Left;  left breast wire localization biopsy  . cataracts right eye    . CESAREAN SECTION  1986  . HERNIA REPAIR  2000   RIH  . opirectinal membrane peel    . RHINOPLASTY  1983    There were no vitals filed for this visit.      Subjective Assessment - 06/28/16 2209    Subjective Caregiver reports they went on a walk earlier this pm, for about 1.5 miles - pt held her dog's leash in one going, then changed hands and held it in other hand on way back home   Pertinent History Precautions:  PEG.  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  Another round of botox in LLE on 02/06/16   Patient Stated Goals Per husband, "For Troy to be as independent as possible."   Currently in Pain? No/denies                         [facility] Adult PT Treatment/Exercise - 06/28/16 0001      Transfers   Transfers Sit to Stand   Sit to Stand 4: Min assist;3: Mod assist  mod assist from low mat to RW   Sit to Stand Details Tactile cues for placement;Verbal cues for technique;Manual facilitation for weight shifting;Verbal cues for gait pattern   Sit to Stand Details (indicate cue type and reason) cues to lean anteriorly   Stand to Sit 4: Min assist;3: Mod assist   Stand to Sit Details (indicate cue type and reason) Verbal cues for technique;Tactile cues for placement   Stand to Sit Details cues to flex at hips and bend knees for controlled descent   Stand Pivot Transfers 4: Min assist;3: Mod assist     Ambulation/Gait   Ambulation/Gait Yes  Ambulation/Gait Assistance 4: Min assist;3: Mod assist   Ambulation/Gait Assistance Details PT's hands on rib cage to facilitate elongation of thoracic trunkand upright posture   Ambulation Distance (Feet) 115 Feet   Assistive device Bilateral platform walker  5" wheels   Gait Pattern Decreased stance time - right;Decreased hip/knee flexion - right;Decreased hip/knee flexion - left;Decreased weight shift to right;Scissoring;Ataxic   Ambulation Surface Level;Indoor     Practiced transfers wheelchair to mat with min to mod assist - squat pivot; cues for hand placement and to lean anteriorly  Pt performed sit to Rt and Lt half sidelying (on elbow and back to upright) with cues for positioning - pt able to transfer onto Lt forearm With SBA and onto Rt forearm with min to CGA; able to return to sitting with supervision with  cues for finding midline  Sit to stand transfers with min assist - x 3 reps ; worked on lateral and anterior/posterior weight shifting with min to mod assist 5 reps Each direction  Pt stepped out/in and forward/back 3 reps with each foot with min to mod assist  Pt performed trunk strengthening - leaning back on wedge - pulling forward with min assist x 5 reps - cues for head position with trunk flexion              PT Short Term Goals - 06/25/16 1205      PT SHORT TERM GOAL #1   Title Jay Back on pt's current manual wheelchair to be switched out to regular back; pt will demonstrate abilty to sit in wheelchair with upright with less supportive wheelchair back.  07-24-16   Baseline Jay Back on current wheelchair   Time 4   Period Weeks   Status New     PT SHORT TERM GOAL #2   Title Pt will perform sit to stand from wheelchair to RW with CGA.  07-24-16   Baseline min to mod assist needed with cues for positioning of feet and to lean forward - 06-24-16   Time 4   Period Weeks   Status New     PT SHORT TERM GOAL #3   Title Family to obtain bil. platform RW to begin assisting pt with ambulation in her home environment.  07-24-16   Baseline have been assessing ability to use bil. platform RW vs. UP walker   Time 4   Period Weeks   Status New     PT SHORT TERM GOAL #4   Title Family will report pt rolling from supine to Rt and Lt sides in hospital bed at home with supervision without use of bed rail with cues for positioning of LE's.  07-24-16   Baseline pt needs min assist at times - activity just demonstrated by pt on 06-24-16   Time 4   Period Weeks   Status New     PT SHORT TERM GOAL #5   Title Basic transfers from wheelchair to bed or commode with CGA.  07-24-16   Baseline min to mod assist needed - 06-24-16   Time 4   Period Weeks     PT SHORT TERM GOAL #6   Title Amb. 56' with bil. platform RW with CGA to begin amb. at home with assistance safely.  07-24-16   Baseline  115' with platform RW with min to mod assist   Time 4   Period Weeks   Status New           PT Long Term Goals -  06/25/16 1142      PT LONG TERM GOAL #1   Title Pt will perform supine rolling R and L at mod I level in order to indicate improved independence with bed mobility.  (Target Date: 06/25/16)/ 08-23-16  CONT. LTG til 08-23-16   Baseline inconsistent in performance - pt needs cues for LE flexion to assist with pushing for rolling and cues for UE positioning   Time 8   Period Weeks   Status On-going     PT LONG TERM GOAL #2   Title Pt will perform sit>SL R or L modified independently in order to indicate improved independence with getting into bed.   08-23-16   Baseline min to mod assist needed - 06-24-16   Time 8   Status New     PT LONG TERM GOAL #3   Title Pt will transfer Lt/Rt sidelying to sitting with CGA for incr. independence with getting out of bed.  08-23-16   Baseline min to mod assist needed -06-24-16   Time 8   Period Weeks   Status New     PT LONG TERM GOAL #4   Title Pt will perform basic transfers with CGA to indicate improved functional independence.  08-23-16   Baseline min assist needed - 06-24-16   Time 8   Period Weeks   Status New     PT LONG TERM GOAL #5   Title Pt will perform static standing with bil. UE support with distant supervision for >/= 10" for improved standing balance.  08-23-16   Baseline close SBA to CGA needed - pt standing with bil UE support for 8-10" per caregiver's report -- 06-24-16   Time 8   Period Weeks   Status New     Additional Long Term Goals   Additional Long Term Goals Yes     PT LONG TERM GOAL #6   Title Pt will demonstrate ability to ambulate up to 25' w/ LRAD at min A level in order to assist with getting around home.  CONT LTG til 08-23-16; goal upgraded to 50'   Baseline bil. platform RW to be used in home   Time 8   Period Weeks   Status On-going     PT LONG TERM GOAL #7   Title Perform sit to stand and stand  to sit to RW or to another object with CGA.  08-23-16   Baseline min assist needed - 06-24-16   Time 8   Period Weeks   Status New               Plan - 06/28/16 2222    Clinical Impression Statement Pt did very well with stand to sit transfer at end of session with verbal cues to lean forward and to flex knees to allow for controlled descent.  Pt also did very well with use of bil. platform RW with 5" wheels with min assist given for faciliation for trunk elongation- gait speed was increased with this RW - pt needed occasional min assist with maneuvering walker from carpet to tile and around curve of track.  Pt able to begin amb. at home with assistance.  Rehab Potential Fair   Clinical Impairments Affecting Rehab Potential Chronicity of condition, continued medical management, but has very strong family and financial support   PT Frequency 2x / week   PT Duration 8 weeks   PT Treatment/Interventions ADLs/Self Care Home Management;DME Instruction;Gait training;Stair training;Functional mobility training;Therapeutic activities;Patient/family education;Cognitive remediation;Neuromuscular re-education;Therapeutic exercise;Balance training;Orthotic Fit/Training;Wheelchair mobility training;Manual techniques;Splinting;Visual/perceptual remediation/compensation;Vestibular;Passive range of motion;Electrical Stimulation   PT Next Visit Plan try technique for incr. upright posture with noodle behind back with use of straps around shoulders; try stretching supine on wedge for opening up rib cage - yoga block under hips if position tolerated   Consulted and Agree with Plan of Care Patient;Family member/caregiver   Family Member Consulted caregiver, [name] and husband [name]      Patient will benefit from skilled therapeutic intervention in order to  improve the following deficits and impairments:  Abnormal gait, Decreased balance, Decreased endurance, Decreased cognition, Cardiopulmonary status limiting activity, Decreased activity tolerance, Decreased coordination, Decreased strength, Impaired flexibility, Impaired tone, Decreased mobility, Increased muscle spasms, Impaired vision/preception, Decreased range of motion, Impaired UE functional use, Postural dysfunction, Improper body mechanics, Impaired perceived functional ability, Decreased knowledge of precautions, Decreased knowledge of use of DME  Visit Diagnosis: Unsteadiness on feet  Muscle weakness (generalized)  Other abnormalities of gait and mobility     Problem List Patient Active Problem List   Diagnosis Date Noted  . Heterotopic ossification of bone 03/10/2016  . Acute on chronic respiratory failure with hypoxia ([place])   . Acute hypoxemic respiratory failure ([place])   . Acute blood loss anemia   . Atelectasis 02/04/2016  . Debility 02/03/2016  . Pneumonia of both lower lobes due to Pseudomonas species ([place])   . Pneumonia of both lower lobes due to methicillin susceptible Staphylococcus aureus (MSSA) ([place])   . Acute respiratory failure with hypoxia ([place])   . Acute encephalopathy   . Seizures ([place])   . Sepsis ([place])   . Hypoxia   . Pain   . HCAP (healthcare-associated pneumonia) 12/28/2015  . Chronic respiratory failure ([place]) 12/28/2015  . Acute tracheobronchitis 12/24/2015  . Tracheostomy tube present ([place])   . Tracheal stenosis   . Chronic respiratory failure with hypoxia ([place])   . 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[place]) 10/20/2015  . Tracheostomy status ([place]) 09/26/2015  . Allergic rhinitis 09/26/2015  . Viral encephalitis 09/22/2015  . Movement disorder 09/22/2015  . Encephalitis 09/22/2015  . Chest pain 02/07/2014  . Premature atrial contractions 01/13/2014  . Abnormality of gait 12/04/2013  . Hyperlipidemia 12/21/2011  . Cardiovascular risk factor 12/21/2011  .  Bunion 01/31/2008  . Metatarsalgia of both feet 09/20/2007  . FLAT FOOT 09/20/2007  . HALLUX RIGIDUS, ACQUIRED 09/20/2007    Alda Lea, PT 06/28/2016, 10:34 PM  [facility] [address] [place], [place], [address] Phone: [phone]   Fax:  [phone]  Name: Judy Spears MRN: [id number] Date of Birth: 1950/04/27

## 2016-06-29 ENCOUNTER — Encounter: Payer: Self-pay | Admitting: Occupational Therapy

## 2016-06-29 ENCOUNTER — Ambulatory Visit: Payer: Medicare Other | Admitting: Occupational Therapy

## 2016-06-29 DIAGNOSIS — R471 Dysarthria and anarthria: Secondary | ICD-10-CM | POA: Diagnosis not present

## 2016-06-29 DIAGNOSIS — R29818 Other symptoms and signs involving the nervous system: Secondary | ICD-10-CM

## 2016-06-29 DIAGNOSIS — R2681 Unsteadiness on feet: Secondary | ICD-10-CM

## 2016-06-29 DIAGNOSIS — M6281 Muscle weakness (generalized): Secondary | ICD-10-CM

## 2016-06-29 DIAGNOSIS — M25622 Stiffness of left elbow, not elsewhere classified: Secondary | ICD-10-CM

## 2016-06-29 DIAGNOSIS — M25611 Stiffness of right shoulder, not elsewhere classified: Secondary | ICD-10-CM

## 2016-06-29 DIAGNOSIS — R293 Abnormal posture: Secondary | ICD-10-CM

## 2016-06-29 DIAGNOSIS — M25612 Stiffness of left shoulder, not elsewhere classified: Secondary | ICD-10-CM

## 2016-06-29 NOTE — Therapy (Signed)
[facility] [address] [place], [place], [address] Phone: [phone]   Fax:  [phone]  Occupational Therapy Treatment  Patient Details  Name: Judy Spears MRN: [id number] Date of Birth: Aug 05, 1950 Referring Provider: Dr. Quita Skye. [name]  Encounter Date: 06/29/2016      OT End of Session - 06/29/16 1343    Visit Number 31   Number of Visits 32   Date for OT Re-Evaluation 07/08/16   Authorization Type BCBS unlimited visits   OT Start Time 4174   OT Stop Time 1317   OT Time Calculation (min) 42 min   Activity Tolerance Patient tolerated treatment well   Behavior During Therapy [facility] for tasks assessed/performed      Past Medical History:  Diagnosis Date  . Arthritis    lt great toe  . Complication of anesthesia    pt has had headaches post op-did advise to hydra well preop  . Hair loss    right-sided  . History of exercise stress test    a. ETT (12/15) with 12:00 exercise, no ST changes, occasional PACs.   . Hyperlipidemia   . Insomnia   . Migraine headache   . Movement disorder    resltess in left legs  . Postmenopausal   . Premature atrial contractions    a. Holter (12/15) with 8% PACs, no atrial runs or atrial fibrillation.     Past Surgical History:  Procedure Laterality Date  . BREAST BIOPSY  01/01/2011   Procedure: BREAST BIOPSY WITH NEEDLE LOCALIZATION;  Surgeon: Rolm Bookbinder, MD;  Location: [facility];  Service: General;  Laterality: Left;  left breast wire localization biopsy  . cataracts right eye    . CESAREAN SECTION  1986  . HERNIA REPAIR  2000   RIH  . opirectinal membrane peel    . RHINOPLASTY  1983    There were no vitals filed for this visit.      Subjective Assessment - 06/29/16 1335    Subjective  Patient expressing excitement at going to [facility] later this afternoon   Patient is accompained by: Family member   Pertinent History see epic  snapshot - ABI/encephalitis/hypoxia from virus; recent hospitalization due to ARF;    Patient Stated Goals Pt unable to state.    Currently in Pain? No/denies   Pain Score 0-No pain                      OT Treatments/Exercises (OP) - 06/29/16 0001      ADLs   Functional Mobility Patient recently started walking with bilateral platforms on rolling walker.  Worked with patient on functional mobility- sit to stand, stand balance with two arms in support, with one arm in support, stand to sit, stand step transfer, turning, backing up, etc.  Patient requires consistent min to mod assist with tasks listed.  Patient with tendency to lean too far forward over feet, but with cueing following facilitation to stay just over toes- patient occasionally able to self correct posture.  Patient had difficulty motor planning  when needing to avoid obstacles, or orient walker in new position.       Neurological Re-education Exercises   Other Exercises 1 Neuromuscular reeducation to address postural control and alignment in sitting.  Worked to establish greater lateral flexion to left side- to create more symmetrical sitting posture for improved balance and mobility  OT Education - 06/29/16 1343    Education provided Yes   Education Details relationship of head to feet in standing   Person(s) Educated Patient;Spouse;Caregiver(s)   Methods Explanation;Demonstration;Verbal cues;Tactile cues   Comprehension Need further instruction;Returned demonstration          OT Short Term Goals - 06/24/16 1558      OT SHORT TERM GOAL #1   Title Pt, husband and cargiver will be mod I with HEP - 03/29/2016   Status Achieved     OT SHORT TERM GOAL #2   Title Pt will be able to sit with supervision without UE support and vc's to assist with midline 75% while engage in functional task   Status Achieved     OT SHORT TERM GOAL #3   Title Pt will be able to complete sit to stand  consistently with mod A for toilet transfers (max a for pivot)   Status Achieved     OT SHORT TERM GOAL #4   Title Pt, husband and caregiver will verbalize understanding of various bathroom accomodations to reduce long term caregiver burden   Status Achieved     OT SHORT TERM GOAL #5   Title Pt will be able to stand step transfer to toilet with mo more than mod a, mod vc's consistently using grab bar for community toileting needs- 06/10/2016 date adjusted as pt missed 2 weeks due to hospitalization   Status Achieved     OT SHORT TERM GOAL #6   Title Pt will be able to wash face with supervision/max cues 50% of the time, min a 50% of the time.    Status Achieved     OT SHORT TERM GOAL #7   Title Pt will be able to don pull over shirt with mod a, max cues   Status Achieved     OT SHORT TERM GOAL #8   Title Pt will be able to maintain static standing with UE support with close supervision to reduce burden of care for caregivers    Status Achieved           OT Long Term Goals - 06/24/16 1558      OT LONG TERM GOAL #1   Title Pt, husband and caregiver will be mod I with upgraded HEP prn - 04/26/2016   Status Achieved     OT LONG TERM GOAL #2   Title Pt, husband and caregiver will be mod I with splint wear and care    Status Achieved     OT LONG TERM GOAL #3   Title Pt will be able to wash face with min a only   Status Achieved     OT LONG TERM GOAL #4   Title Pt will be able to sit without UE support with supevision 100% of the time with no more than 2 vc's while engaged in functional activity.   Status Achieved     OT LONG TERM GOAL #5   Title Pt, husband and caregiver will verbalize understanding of potential after care therapeutic opportunities. - 07/08/2016 date adjusted as pt missed 2 weeks due to hospitalization   Status On-going     OT LONG TERM GOAL #6   Title Pt will be supervision to wash face consistently   Status On-going     OT LONG TERM GOAL #7   Title Pt  will don shirt with min a and max vc's 50% of the time   Status On-going     OT  LONG TERM GOAL #8   Title Pt will require min a for stand step transfers to toilet using grab bar for community toileting at least 75% of the time.   Status On-going     OT LONG TERM GOAL  #9   Baseline Pt will stand with contact gurard with only 1 UE support for static standing balance in prep for abiilty to engage in functional task while standing.    Status Achieved               Plan - 06/29/16 1343    Clinical Impression Statement Patient continues with steady progress toward OT goals.     Rehab Potential Fair   Current Impairments/barriers affecting progress: severity of deficits, very slow rate of recovery   OT Frequency 2x / week   OT Duration 8 weeks   OT Treatment/Interventions Self-care/ADL training;Ultrasound;Moist Heat;Electrical Stimulation;DME and/or AE instruction;Neuromuscular education;Therapeutic exercise;Functional Mobility Training;Manual Therapy;Passive range of motion;Splinting;Therapeutic activities;Balance training;Patient/family education;Visual/perceptual remediation/compensation;Cognitive remediation/compensation   Plan NMR trunk, functional use of UE's, functional mobility   OT Home Exercise Plan Reviewed plan for home management for left elbow range of motion - JAS 3x/day FOR 30 min./time, and locked elbow extesnion brace at night   Consulted and Agree with Plan of Care Patient;Family member/caregiver   Family Member Consulted caregiver [name] , husband [name]      Patient will benefit from skilled therapeutic intervention in order to improve the following deficits and impairments:  Decreased endurance, Decreased skin integrity, Impaired vision/preception, Improper body mechanics, Impaired perceived functional ability, Decreased knowledge of precautions, Cardiopulmonary status limiting activity, Decreased activity tolerance, Decreased knowledge of use of DME, Decreased  strength, Impaired flexibility, Improper spinal/pelvic alignment, Impaired sensation, Difficulty walking, Decreased mobility, Decreased balance, Decreased cognition, Decreased range of motion, Impaired tone, Impaired UE functional use, Decreased safety awareness, Decreased coordination, Pain  Visit Diagnosis: Unsteadiness on feet  Muscle weakness (generalized)  Abnormal posture  Other symptoms and signs involving the nervous system  Stiffness of right shoulder, not elsewhere classified  Stiffness of left shoulder, not elsewhere classified  Stiffness of left elbow, not elsewhere classified    Problem List Patient Active Problem List   Diagnosis Date Noted  . Heterotopic ossification of bone 03/10/2016  . Acute on chronic respiratory failure with hypoxia ([place])   . Acute hypoxemic respiratory failure ([place])   . Acute blood loss anemia   . Atelectasis 02/04/2016  . Debility 02/03/2016  . Pneumonia of both lower lobes due to Pseudomonas species ([place])   . Pneumonia of both lower lobes due to methicillin susceptible Staphylococcus aureus (MSSA) ([place])   . Acute respiratory failure with hypoxia ([place])   . Acute encephalopathy   . Seizures ([place])   . Sepsis ([place])   . Hypoxia   . Pain   . HCAP (healthcare-associated pneumonia) 12/28/2015  . Chronic respiratory failure ([place]) 12/28/2015  . Acute tracheobronchitis 12/24/2015  . Tracheostomy tube present ([place])   . Tracheal stenosis   . Chronic respiratory failure with hypoxia ([place])   . 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[place]) 10/20/2015  . Tracheostomy status ([place]) 09/26/2015  . Allergic rhinitis 09/26/2015  . Viral encephalitis 09/22/2015  . Movement disorder 09/22/2015  . Encephalitis 09/22/2015  . Chest pain 02/07/2014  . Premature atrial contractions 01/13/2014  . Abnormality of gait 12/04/2013  . Hyperlipidemia 12/21/2011  . Cardiovascular risk factor 12/21/2011  . Bunion 01/31/2008  . Metatarsalgia of both feet 09/20/2007  . FLAT FOOT  09/20/2007  . HALLUX RIGIDUS, ACQUIRED 09/20/2007  Mariah Milling, OTR/L 06/29/2016, 1:45 PM  [facility] [address] [place], [place], [address] Phone: [phone]   Fax:  [phone]  Name: Judy Spears MRN: [id number] Date of Birth: Apr 24, 1950

## 2016-06-29 NOTE — Therapy (Signed)
[facility] [address] [place], [place], [address] Phone: [phone]   Fax:  [phone]  Speech Language Pathology Treatment and Recertification  Patient Details  Name: Judy Spears MRN: [id number] Date of Birth: 13-May-1950 Referring Provider: Charlett Blake, MD/ Alger Simons, MD  Encounter Date: 06/28/2016      End of Session - 06/28/16 1620    Visit Number 31   Number of Visits 41   Date for SLP Re-Evaluation 07/30/16   SLP Start Time 1620   SLP Stop Time  1705   SLP Time Calculation (min) 45 min   Activity Tolerance Patient tolerated treatment well      Past Medical History:  Diagnosis Date  . Arthritis    lt great toe  . Complication of anesthesia    pt has had headaches post op-did advise to hydra well preop  . Hair loss    right-sided  . History of exercise stress test    a. ETT (12/15) with 12:00 exercise, no ST changes, occasional PACs.   . Hyperlipidemia   . Insomnia   . Migraine headache   . Movement disorder    resltess in left legs  . Postmenopausal   . Premature atrial contractions    a. Holter (12/15) with 8% PACs, no atrial runs or atrial fibrillation.     Past Surgical History:  Procedure Laterality Date  . BREAST BIOPSY  01/01/2011   Procedure: BREAST BIOPSY WITH NEEDLE LOCALIZATION;  Surgeon: Rolm Bookbinder, MD;  Location: [facility];  Service: General;  Laterality: Left;  left breast wire localization biopsy  . cataracts right eye    . CESAREAN SECTION  1986  . HERNIA REPAIR  2000   RIH  . opirectinal membrane peel    . RHINOPLASTY  1983    There were no vitals filed for this visit.      Subjective Assessment - 06/28/16 1621    Subjective Pt arrives in wheelchair, inaudible greeting to SLP.   Patient is accompained by: Family member   Special Tests spouse, [name]; caregiver [name]   Currently in Pain? No/denies               ADULT SLP  TREATMENT - 06/28/16 1622      General Information   Behavior/Cognition Alert;Cooperative;Pleasant mood   Patient Positioning Upright in chair   Oral care provided Yes     Treatment Provided   Treatment provided Dysphagia;Cognitive-Linquistic     Dysphagia Treatment   Temperature Spikes Noted No   Respiratory Status Room air   Oral Cavity - Dentition Adequate natural dentition   Treatment Methods Skilled observation;Compensation strategy training;Therapeutic exercise   Patient observed directly with PO's Yes   Type of PO's observed Thin liquids;Dysphagia 3 (soft)   Feeding Needs assist   Liquids provided via Cup   Oral Phase Signs & Symptoms Anterior loss/spillage;Prolonged mastication;Prolonged bolus formation   Pharyngeal Phase Signs & Symptoms Multiple swallows;Immediate cough   Type of cueing Verbal;Visual   Amount of cueing Moderate   Other treatment/comments  Pt/ family have not been compliant with PO recommendations; pt has been consuming mechanical soft foods at home, including beans, baked chicken, bread with avocado. Today for upgraded trials of mechanical soft solids (cupcake) pt with prolonged oral stage with reduced bolus formation, requiring multiple swallows (5+) for adequate bolus clearance. Anterior spillage noted of solid bolus with copious secretions, immediate cough x1 noted; suspect reduced airway protection with oral spillage  of secretions/residual. Pt's husband states "she swallows everything" at home with no overt s/sx of aspiration. SLP reviewed current recommendations for pleasure feeds of dys 1, thin liquids with continued trials of mechanical soft solids with SLP only. Pt's husband states, "We're beyond that now. We're not going back to puree." SLP educated re: risks and overt signs of aspiration; if family wishes to advance solids for quality of life purposes, recommended they adhere to strict oral care before and after PO, feed only when pt's attention at optimum,  cease PO with any overt signs of aspiration. Pt tolerates thin liquids today with minimal oral spillage, no overt signs of aspiration and need for occasional visual cues to complete secondary swallows.     Pain Assessment   Pain Assessment No/denies pain     Cognitive-Linquistic Treatment   Treatment focused on Dysarthria;Voice   Skilled Treatment Caregiver states she forgot to bring RMT devices today; SLP reviewed HEP and provided handout with written instructions. Reviewed plan of care for voice/dysarthria goals; updated to include RMT.     Tracheostomy Shiley 4 mm Uncuffed   Tracheostomy Properties Placement Date: 05/08/16 Placement Time: 1030 Placed By: ICU physician Brand: Shiley Size (mm): 4 mm Style: Uncuffed   Status Capped   Site Assessment Clean;Dry   Ties Assessment Clean;Dry;Secure     Assessment / Recommendations / Plan   Plan Goals updated     Dysphagia Recommendations   Diet recommendations Dysphagia 1 (puree);Thin liquid   Liquids provided via Teaspoon;Cup   Medication Administration Via alternative means   Supervision Full supervision/cueing for compensatory strategies   Compensations Effortful swallow;Multiple dry swallows after each bite/sip;Check for anterior loss;Small sips/bites;Slow rate   Postural Changes and/or Swallow Maneuvers Seated upright 90 degrees     Progression Toward Goals   Progression toward goals Goals met and updated   Patient/Family Stated Goal Eat and drink by mouth, improve cough, build breath support for increased vocal amplitude   Time for goal achievement 07/30/16   Goal formation with patient/family   Potential to Achieve Goals Good   Potential Considerations Severity of impairments;Ability to learn/carryover information          SLP Education - 06/29/16 0815    Education provided Yes   Education Details pleasure feeds recommendation, signs of aspiration, HEP   Methods Explanation;Verbal cues   Comprehension Verbalized  understanding;Need further instruction          SLP Short Term Goals - 06/28/16 8916      SLP SHORT TERM GOAL #1   Title Patient will utilize intelligibility strategies with moderate visual cues from communication partner/visual aid to communicate 5-7 word wants, needs, thoughts and ideas.   Status Achieved     SLP SHORT TERM GOAL #2   Title Patient will utilize intelligibility strategies, breath support with mod A for phrase repetition in 75% of trials.   Status Achieved     SLP SHORT TERM GOAL #4   Title Patient will tolerate 6oz water, >95% with no overt signs of aspiration.   Status Achieved     SLP SHORT TERM GOAL #5   Title Patient will utilize verbal cues for lip closure, dry swallow for secretion management in 75% of opportunities.   Status Achieved          SLP Long Term Goals - 06/28/16 1620      SLP LONG TERM GOAL #1   Title Patient will utilize multimodal communication/AAC to clarify wants, needs, thoughts or ideas during communication  breakdowns with max A   Status Deferred     SLP LONG TERM GOAL #2   Title Patient will utilize intelligibility strategies, breath support with min A visual cues for 5-7 word responses with 80% accuracy.   Baseline 06/17/16   Time 4   Period Weeks   Status Revised  Progressing; will continue     SLP LONG TERM GOAL #3   Title Patient will demonstrate readiness for MBS to determine safety for pleasure feeds by timely initiation of swallow in 3/5 trials with ice chips/H20.   Status Achieved     SLP LONG TERM GOAL #4   Title Pt will consume pleasure feeds of desired consistency (soft solids and thin liquids) with signs of aspiration in fewer than 10% of trials with caregiver providing moderate cues and monitoring for overt signs of aspiration.   Time 4   Status Revised     SLP LONG TERM GOAL #5   Title Patient will utilize visual cue for lip closure, dry swallow for secretion management in 75% of opportunities.   Baseline  5.7.18, 06/07/16, 06/28/16   Time 1   Period Weeks   Status Achieved     Additional Long Term Goals   Additional Long Term Goals Yes     SLP LONG TERM GOAL #6   Title Pt will demo HEP for RMT, dysphagia with mod caregiver assistance.   Time 4   Period Weeks   Status New          Plan - 06/28/16 1420    Clinical Impression Statement Pt has made slow, steady progress, achieved 5/5 short term goals, 2/4 remaining long term goals. She continues to progress toward intelligibility and comfort feeding goals. Pt, family and caregiver continue to demonstrate motivation to improve, though they have not been compliant with current diet recommendations of dys 1, thin liquids. Pt is currently consuming mechanical soft textures at home. Pt will benefit from continued skilled ST to address underlying physical impairments impacting swallowing and speech, as well as education and training in monitoring for and reducing risks of aspiration. Long term goals revised and continued for additional 4 weeks, updated to include RMT to improve respiratory strength for speaking, swallowing.    Speech Therapy Frequency 2x / week   Duration 4 weeks   Treatment/Interventions Aspiration precaution training;Trials of upgraded texture/liquids;Compensatory strategies;Functional tasks;Patient/family education;Cueing hierarchy;Diet toleration management by SLP;Cognitive reorganization;Compensatory techniques;SLP instruction and feedback;Internal/external aids;Multimodal communcation approach   Potential to Achieve Goals Good   Potential Considerations Ability to learn/carryover information;Co-morbidities;Severity of impairments   SLP Home Exercise Plan Pleasure feeds purees, thin liquids, strict oral care before and after PO, monitor for signs of fatigue, aspiration. RMT   Consulted and Agree with Plan of Care Family member/caregiver;Patient   Family Member Consulted husband chip, caregiver lisa      Patient will benefit  from skilled therapeutic intervention in order to improve the following deficits and impairments:   Dysarthria and anarthria  Dysphagia, oropharyngeal phase    Problem List Patient Active Problem List   Diagnosis Date Noted  . Heterotopic ossification of bone 03/10/2016  . Acute on chronic respiratory failure with hypoxia ([facility])   . Acute hypoxemic respiratory failure ([place])   . Acute blood loss anemia   . Atelectasis 02/04/2016  . Debility 02/03/2016  . Pneumonia of both lower lobes due to Pseudomonas species ([facility])   . Pneumonia of both lower lobes due to methicillin susceptible Staphylococcus aureus (MSSA) ([place])   . Acute  respiratory failure with hypoxia ([place])   . Acute encephalopathy   . Seizures ([place])   . Sepsis ([place])   . Hypoxia   . Pain   . HCAP (healthcare-associated pneumonia) 12/28/2015  . Chronic respiratory failure ([facility]) 12/28/2015  . Acute tracheobronchitis 12/24/2015  . Tracheostomy tube present ([facility])   . Tracheal stenosis   . Chronic respiratory failure with hypoxia ([facility])   . 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 Tracheostomy status ([place]) 09/26/2015  . Allergic rhinitis 09/26/2015  . Viral encephalitis 09/22/2015  . Movement disorder 09/22/2015  . Encephalitis 09/22/2015  . Chest pain 02/07/2014  . Premature atrial contractions 01/13/2014  . Abnormality of gait 12/04/2013  . Hyperlipidemia 12/21/2011  . Cardiovascular risk factor 12/21/2011  . Bunion 01/31/2008  . Metatarsalgia of both feet 09/20/2007  . FLAT FOOT 09/20/2007  . HALLUX RIGIDUS, ACQUIRED 09/20/2007   Deneise Lever, [place], [place] Speech-Language Pathologist   Aliene Altes 06/29/2016, 8:47 AM  [facility] [address] [place], [place], [address] Phone: [phone]   Fax:  [phone]   Name: Judy Spears MRN: [id number] Date of Birth: [birth year]

## 2016-06-30 ENCOUNTER — Ambulatory Visit
Admission: RE | Admit: 2016-06-30 | Discharge: 2016-06-30 | Disposition: A | Discharge status: Home / Self Care | Payer: Medicare Other | Source: Ambulatory Visit | Attending: Family Medicine | Admitting: Family Medicine

## 2016-06-30 ENCOUNTER — Other Ambulatory Visit: Payer: Self-pay | Admitting: Family Medicine

## 2016-06-30 ENCOUNTER — Ambulatory Visit: Payer: Medicare Other | Admitting: Occupational Therapy

## 2016-06-30 DIAGNOSIS — N6320 Unspecified lump in the left breast, unspecified quadrant: Secondary | ICD-10-CM

## 2016-06-30 DIAGNOSIS — R921 Mammographic calcification found on diagnostic imaging of breast: Secondary | ICD-10-CM

## 2016-06-30 DIAGNOSIS — N63 Unspecified lump in unspecified breast: Secondary | ICD-10-CM

## 2016-06-30 HISTORY — PX: BREAST BIOPSY: SHX20

## 2016-07-01 ENCOUNTER — Encounter: Payer: Self-pay | Admitting: Rehabilitation

## 2016-07-01 ENCOUNTER — Ambulatory Visit: Payer: Medicare Other | Admitting: Rehabilitation

## 2016-07-01 ENCOUNTER — Ambulatory Visit: Payer: Medicare Other | Admitting: Occupational Therapy

## 2016-07-01 ENCOUNTER — Telehealth: Payer: Self-pay | Admitting: Physical Therapy

## 2016-07-01 ENCOUNTER — Ambulatory Visit: Payer: Medicare Other | Admitting: Speech Pathology

## 2016-07-01 ENCOUNTER — Encounter: Payer: Self-pay | Admitting: Occupational Therapy

## 2016-07-01 DIAGNOSIS — R29818 Other symptoms and signs involving the nervous system: Secondary | ICD-10-CM

## 2016-07-01 DIAGNOSIS — R471 Dysarthria and anarthria: Secondary | ICD-10-CM | POA: Diagnosis not present

## 2016-07-01 DIAGNOSIS — M6281 Muscle weakness (generalized): Secondary | ICD-10-CM

## 2016-07-01 DIAGNOSIS — R2681 Unsteadiness on feet: Secondary | ICD-10-CM

## 2016-07-01 DIAGNOSIS — R293 Abnormal posture: Secondary | ICD-10-CM

## 2016-07-01 DIAGNOSIS — R41844 Frontal lobe and executive function deficit: Secondary | ICD-10-CM

## 2016-07-01 DIAGNOSIS — M25622 Stiffness of left elbow, not elsewhere classified: Secondary | ICD-10-CM

## 2016-07-01 DIAGNOSIS — M25611 Stiffness of right shoulder, not elsewhere classified: Secondary | ICD-10-CM

## 2016-07-01 DIAGNOSIS — A86 Unspecified viral encephalitis: Secondary | ICD-10-CM

## 2016-07-01 DIAGNOSIS — G8250 Quadriplegia, unspecified: Secondary | ICD-10-CM

## 2016-07-01 DIAGNOSIS — M25612 Stiffness of left shoulder, not elsewhere classified: Secondary | ICD-10-CM

## 2016-07-01 DIAGNOSIS — R1312 Dysphagia, oropharyngeal phase: Secondary | ICD-10-CM

## 2016-07-01 NOTE — Therapy (Signed)
[facility] [address] [place], [place], [address] Phone: [phone]   Fax:  [phone]  Speech Language Pathology Treatment  Patient Details  Name: Judy Spears MRN: [id number] Date of Birth: [birth year] Referring Provider: Charlett Blake, MD  Encounter Date: 07/01/2016      End of Session - 07/01/16 1819    Visit Number 33   Number of Visits 41   Date for SLP Re-Evaluation 07/30/16   SLP Start Time 1450   SLP Stop Time  1537   SLP Time Calculation (min) 47 min   Activity Tolerance Patient tolerated treatment well      Past Medical History:  Diagnosis Date  . Arthritis    lt great toe  . Complication of anesthesia    pt has had headaches post op-did advise to hydra well preop  . Hair loss    right-sided  . History of exercise stress test    a. ETT (12/15) with 12:00 exercise, no ST changes, occasional PACs.   . Hyperlipidemia   . Insomnia   . Migraine headache   . Movement disorder    resltess in left legs  . Postmenopausal   . Premature atrial contractions    a. Holter (12/15) with 8% PACs, no atrial runs or atrial fibrillation.     Past Surgical History:  Procedure Laterality Date  . BREAST BIOPSY  01/01/2011   Procedure: BREAST BIOPSY WITH NEEDLE LOCALIZATION;  Surgeon: Rolm Bookbinder, MD;  Location: [facility];  Service: General;  Laterality: Left;  left breast wire localization biopsy  . BREAST BIOPSY Right 07/27/2012  . cataracts right eye    . CESAREAN SECTION  1986  . HERNIA REPAIR  2000   RIH  . opirectinal membrane peel    . RHINOPLASTY  1983    There were no vitals filed for this visit.      Subjective Assessment - 07/01/16 1455    Subjective "Hi" low amplitude   Patient is accompained by: Family member   Special Tests spouse, [name]; caregiver [name]   Currently in Pain? No/denies               ADULT SLP TREATMENT - 07/01/16 1450      General  Information   Behavior/Cognition Alert;Cooperative;Pleasant mood   Patient Positioning Upright in chair   Oral care provided Yes     Treatment Provided   Treatment provided Dysphagia;Cognitive-Linquistic     Dysphagia Treatment   Temperature Spikes Noted No   Respiratory Status Room air   Oral Cavity - Dentition Adequate natural dentition   Treatment Methods Skilled observation;Compensation strategy training;Therapeutic exercise   Patient observed directly with PO's Yes   Type of PO's observed Dysphagia 2 (chopped);Thin liquids   Feeding Needs assist   Liquids provided via Cup   Oral Phase Signs & Symptoms Anterior loss/spillage;Prolonged mastication;Prolonged bolus formation   Pharyngeal Phase Signs & Symptoms Delayed cough;Multiple swallows   Type of cueing Verbal;Visual   Amount of cueing Moderate   Other treatment/comments Caregiver arrived with chopped mechanical soft texture food for PO trials. Pt's husband states, "She ate these for dinner last night and did well." SLP instructed caregiver in monitoring for pocketing, oral residue and cuing pt for dry swallows to clear residue as caregiver assisted pt with feeding 1/2 inch bite sized boluses of grilled vegetables. Pt with prolonged oral phase, reduced bolus formation, piecemeal swallowing and retention of vegetable skin (cooked peppers) along palate, upper  teeth and posterior aspect of tongue. SLP removed skin from remaining peppers, which reduced oral residue, though pt continued to require extended time for bolus formation and multiple swallows. No overt signs of aspiration noted with solids, however pt noted with impulsive intake of sequential boluses of thin liquids resulting in delayed cough x2. No overt signs of aspiration noted with single sips. Family has expressed intention to continue feeding solids despite recommendation for pureed; SLP educated on how to maximize safety for PO intake with this desired texture, recommending  boluses <1/2 inch, soft uniform texture, avoiding hulls and skins, monitoring for pocketing and oral residue. Recommended caregiver continue to assist pt with self-feeding of liquids in order to reduce rate of intake and bolus size.      Pain Assessment   Pain Assessment No/denies pain     Cognitive-Linquistic Treatment   Treatment focused on Dysarthria;Voice   Skilled Treatment Pt required mod cues today for vocal amplitude, dry swallows to improve intelligibility for short conversational responses. Caregiver to bring RMT device to next session.      Tracheostomy Shiley 4 mm Uncuffed   Tracheostomy Properties Placement Date: 05/08/16 Placement Time: 1030 Placed By: ICU physician Brand: Shiley Size (mm): 4 mm Style: Uncuffed   Status Capped   Site Assessment Clean;Dry   Ties Assessment Clean;Dry;Secure     Assessment / Recommendations / Plan   Plan Continue with current plan of care     Dysphagia Recommendations   Diet recommendations Thin liquid;Dysphagia 1 (puree)   Liquids provided via Teaspoon;Cup   Medication Administration Via alternative means   Supervision Full supervision/cueing for compensatory strategies   Compensations Effortful swallow;Multiple dry swallows after each bite/sip;Check for anterior loss;Small sips/bites;Slow rate;Check for pocketing   Postural Changes and/or Swallow Maneuvers Seated upright 90 degrees     Progression Toward Goals   Progression toward goals Progressing toward goals   Patient/Family Stated Goal Eat and drink by mouth, improve cough, build breath support for increased vocal amplitude          SLP Education - 07/01/16 1818    Education provided Yes   Education Details avoid vegetables/fruits with tough skins or hulls, monitor for oral clearance, single cup sips   Person(s) Educated Patient;Spouse;Caregiver(s)   Methods Explanation;Demonstration;Verbal cues   Comprehension Verbalized understanding;Need further instruction;Returned  demonstration          SLP Short Term Goals - 07/01/16 1820      SLP SHORT TERM GOAL #1   Title Patient will utilize intelligibility strategies with moderate visual cues from communication partner/visual aid to communicate 5-7 word wants, needs, thoughts and ideas.   Status Achieved     SLP SHORT TERM GOAL #2   Title Patient will utilize intelligibility strategies, breath support with mod A for phrase repetition in 75% of trials.   Status Achieved     SLP SHORT TERM GOAL #3   Title Pt will swallow thin liquids, purees and soft solids in 9/10 trials with no overt signs of aspiration.   Status Achieved     SLP SHORT TERM GOAL #4   Title Patient will tolerate 6oz water, >95% with no overt signs of aspiration.   Status Achieved     SLP SHORT TERM GOAL #5   Title Patient will utilize verbal cues for lip closure, dry swallow for secretion management in 75% of opportunities.   Status Achieved          SLP Long Term Goals - 07/01/16 1820  SLP LONG TERM GOAL #1   Title Patient will utilize multimodal communication/AAC to clarify wants, needs, thoughts or ideas during communication breakdowns with max A   Status Deferred     SLP LONG TERM GOAL #2   Title Patient will utilize intelligibility strategies, breath support with min A visual cues for 5-7 word responses with 80% accuracy.   Time 4   Period Weeks   Status On-going     SLP LONG TERM GOAL #3   Title Patient will demonstrate readiness for MBS to determine safety for pleasure feeds by timely initiation of swallow in 3/5 trials with ice chips/H20.   Status Achieved     SLP LONG TERM GOAL #4   Title Pt will consume pleasure feeds of desired consistency (soft solids and thin liquids) with signs of aspiration in fewer than 10% of trials with caregiver providing moderate cues and monitoring for overt signs of aspiration.   Time 4   Period Weeks   Status On-going     SLP LONG TERM GOAL #5   Title Patient will utilize  visual cue for lip closure, dry swallow for secretion management in 75% of opportunities.   Status Achieved     SLP LONG TERM GOAL #6   Title Pt will demo HEP for RMT, dysphagia with mod caregiver assistance.   Time 4   Period Weeks   Status On-going          Plan - 07/01/16 1819    Clinical Impression Statement Pt continues to progress toward intelligibility and comfort feeding goals. Pt, family and caregiver continue to demonstrate motivation to improve, though they have not been compliant with current diet recommendations of dys 1, thin liquids. Pt is currently consuming mechanical soft textures at home. Pt will benefit from continued skilled ST to address underlying physical impairments impacting swallowing and speech, as well as education and training in monitoring for and reducing risks of aspiration.    Speech Therapy Frequency 2x / week   Duration 4 weeks   Treatment/Interventions Aspiration precaution training;Trials of upgraded texture/liquids;Compensatory strategies;Functional tasks;Patient/family education;Cueing hierarchy;Diet toleration management by SLP;Cognitive reorganization;Compensatory techniques;SLP instruction and feedback;Internal/external aids;Multimodal communcation approach   Potential to Achieve Goals Good   Potential Considerations Ability to learn/carryover information;Co-morbidities;Severity of impairments   SLP Home Exercise Plan Pleasure feeds purees, thin liquids, strict oral care before and after PO, monitor for signs of fatigue, aspiration. RMT   Consulted and Agree with Plan of Care Family member/caregiver;Patient      Patient will benefit from skilled therapeutic intervention in order to improve the following deficits and impairments:   Dysphagia, oropharyngeal phase  Dysarthria and anarthria    Problem List Patient Active Problem List   Diagnosis Date Noted  . Heterotopic ossification of bone 03/10/2016  . Acute on chronic respiratory failure  with hypoxia ([place])   . Acute hypoxemic respiratory failure ([place])   . Acute blood loss anemia   . Atelectasis 02/04/2016  . Debility 02/03/2016  . Pneumonia of both lower lobes due to Pseudomonas species ([facility])   . Pneumonia of both lower lobes due to methicillin susceptible Staphylococcus aureus (MSSA) ([facility])   . Acute respiratory failure with hypoxia ([place])   . Acute encephalopathy   . Seizures ([place])   . Sepsis ([place])   . Hypoxia   . Pain   . HCAP (healthcare-associated pneumonia) 12/28/2015  . Chronic respiratory failure ([place] [place]) 12/28/2015  . Acute tracheobronchitis 12/24/2015  . Tracheostomy tube present ([facility])   . Tracheal  stenosis   . Chronic respiratory failure with hypoxia ([place])   . 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[place]) 10/20/2015  . Tracheostomy status ([place]) 09/26/2015  . Allergic rhinitis 09/26/2015  . Viral encephalitis 09/22/2015  . Movement disorder 09/22/2015  . Encephalitis 09/22/2015  . Chest pain 02/07/2014  . Premature atrial contractions 01/13/2014  . Abnormality of gait 12/04/2013  . Hyperlipidemia 12/21/2011  . Cardiovascular risk factor 12/21/2011  . Bunion 01/31/2008  . Metatarsalgia of both feet 09/20/2007  . FLAT FOOT 09/20/2007  . HALLUX RIGIDUS, ACQUIRED 09/20/2007   Deneise Lever, [place], [place] Speech-Language Pathologist  Aliene Altes 07/01/2016, 6:22 PM  [facility] [address] [place], [place], [address] Phone: [phone]   Fax:  [phone]   Name: Judy Spears MRN: [id number] Date of Birth: Feb 13, 1950

## 2016-07-01 NOTE — Telephone Encounter (Signed)
Dr. [name], Could we please get an order for a bilateral platform rolling walker with 5" wheels? Judy Spears is progressing with ambulation and is ready to begin amb. In home with caregiver's assistance. Please place order in Epic if you agree.  Thanks, Vinnie Level

## 2016-07-01 NOTE — Therapy (Signed)
[facility] [address] [place], [place], [address] Phone: [phone]   Fax:  [phone]  Occupational Therapy Treatment  Patient Details  Name: Judy Spears MRN: [id number] Date of Birth: 1950/11/14 Referring Provider: Dr. Quita Skye. [name]  Encounter Date: 07/01/2016      OT End of Session - 07/01/16 1459    Visit Number 32   Date for OT Re-Evaluation 07/08/16   Authorization Type BC[employer] unlimited visits   OT Start Time 6759   OT Stop Time 1400   OT Time Calculation (min) 43 min   Activity Tolerance Patient tolerated treatment well      Past Medical History:  Diagnosis Date  . Arthritis    lt great toe  . Complication of anesthesia    pt has had headaches post op-did advise to hydra well preop  . Hair loss    right-sided  . History of exercise stress test    a. ETT (12/15) with 12:00 exercise, no ST changes, occasional PACs.   . Hyperlipidemia   . Insomnia   . Migraine headache   . Movement disorder    resltess in left legs  . Postmenopausal   . Premature atrial contractions    a. Holter (12/15) with 8% PACs, no atrial runs or atrial fibrillation.     Past Surgical History:  Procedure Laterality Date  . BREAST BIOPSY  01/01/2011   Procedure: BREAST BIOPSY WITH NEEDLE LOCALIZATION;  Surgeon: Rolm Bookbinder, MD;  Location: [facility];  Service: General;  Laterality: Left;  left breast wire localization biopsy  . BREAST BIOPSY Right 07/27/2012  . cataracts right eye    . CESAREAN SECTION  1986  . HERNIA REPAIR  2000   RIH  . opirectinal membrane peel    . RHINOPLASTY  1983    There were no vitals filed for this visit.      Subjective Assessment - 07/01/16 1325    Subjective  Just a little (when asked if her chest hurt)   Patient is accompained by: Family member  husband [name], caregiver [name]   Pertinent History see epic snapshot - ABI/encephalitis/hypoxia from virus;  recent hospitalization due to ARF;    Patient Stated Goals Pt unable to state.    Currently in Pain? Yes   Pain Score 2   pt had left breast biopsy yesterday   Pain Location Chest   Pain Orientation Left   Pain Descriptors / Indicators Sore   Pain Type Acute pain   Pain Onset Yesterday   Pain Frequency Constant   Aggravating Factors  pt had L breast biopsy yesterday   Pain Relieving Factors unknown                      OT Treatments/Exercises (OP) - 07/01/16 0001      Neurological Re-education Exercises   Other Exercises 1 Neuro re ed to address active A/P titls in sitting as well as rotation and side leaning to the right for postural alignment and orientation. Progressed to addressing sit to stand with facilitation for foward excursion especially with head as well as forward lean in midline, cues to push from her feet - from elevated surface pt needs min a and max cues.  Addressed static standing, dynamic weight shifting and stepping with BUE support and cues for thoracic extension and head alignment. Pt also tends to have anterior bias in standing with poor awareness however can respond to cues.  Pt  needs step by step cuing to shift weight and to shift in order to step however ability to follow commands is improving. Addressed active stand to sit - pt with signficant motor planning deficts and requires mod facilitation and max cues.                    OT Short Term Goals - 07/01/16 1453      OT SHORT TERM GOAL #1   Title Pt, husband and cargiver will be mod I with HEP - 03/29/2016   Status Achieved     OT SHORT TERM GOAL #2   Title Pt will be able to sit with supervision without UE support and vc's to assist with midline 75% while engage in functional task   Status Achieved     OT SHORT TERM GOAL #3   Title Pt will be able to complete sit to stand consistently with mod A for toilet transfers (max a for pivot)   Status Achieved     OT SHORT TERM GOAL #4    Title Pt, husband and caregiver will verbalize understanding of various bathroom accomodations to reduce long term caregiver burden   Status Achieved     OT SHORT TERM GOAL #5   Title Pt will be able to stand step transfer to toilet with mo more than mod a, mod vc's consistently using grab bar for community toileting needs- 06/10/2016 date adjusted as pt missed 2 weeks due to hospitalization   Status Achieved     OT SHORT TERM GOAL #6   Title Pt will be able to wash face with supervision/max cues 50% of the time, min a 50% of the time.    Status Achieved     OT SHORT TERM GOAL #7   Title Pt will be able to don pull over shirt with mod a, max cues   Status Achieved     OT SHORT TERM GOAL #8   Title Pt will be able to maintain static standing with UE support with close supervision to reduce burden of care for caregivers    Status Achieved           OT Long Term Goals - 07/01/16 1454      OT LONG TERM GOAL #1   Title Pt, husband and caregiver will be mod I with upgraded HEP prn - 04/26/2016   Status Achieved     OT LONG TERM GOAL #2   Title Pt, husband and caregiver will be mod I with splint wear and care    Status Achieved     OT LONG TERM GOAL #3   Title Pt will be able to wash face with min a only   Status Achieved     OT LONG TERM GOAL #4   Title Pt will be able to sit without UE support with supevision 100% of the time with no more than 2 vc's while engaged in functional activity.   Status Achieved     OT LONG TERM GOAL #5   Title Pt, husband and caregiver will verbalize understanding of potential after care therapeutic opportunities. - 07/08/2016 date adjusted as pt missed 2 weeks due to hospitalization   Status On-going     OT LONG TERM GOAL #6   Title Pt will be supervision to wash face consistently   Status Achieved     OT LONG TERM GOAL #7   Title Pt will don shirt with min a and max vc's 50% of the  time   Status On-going     OT LONG TERM GOAL #8   Title Pt  will require min a for stand step transfers to toilet using grab bar for community toileting at least 75% of the time.   Status On-going     OT LONG TERM GOAL  #9   Baseline Pt will stand with contact gurard with only 1 UE support for static standing balance in prep for abiilty to engage in functional task while standing.    Status Achieved               Plan - 07/01/16 1454    Clinical Impression Statement Pt continues with slow but steady progress toward goals.  Pt is slowly improving in ability to follow commands for postural correction.    Rehab Potential Fair   Current Impairments/barriers affecting progress: severity of deficits, very slow rate of recovery   OT Frequency 2x / week   OT Duration 8 weeks   OT Treatment/Interventions Self-care/ADL training;Ultrasound;Moist Heat;Electrical Stimulation;DME and/or AE instruction;Neuromuscular education;Therapeutic exercise;Functional Mobility Training;Manual Therapy;Passive range of motion;Splinting;Therapeutic activities;Balance training;Patient/family education;Visual/perceptual remediation/compensation;Cognitive remediation/compensation   Plan NMR for trunk, functional use of UE's, functional mobility, renew next session   Consulted and Agree with Plan of Care Patient;Family member/caregiver   Family Member Consulted caregiver [name] , husband [name]      Patient will benefit from skilled therapeutic intervention in order to improve the following deficits and impairments:  Decreased endurance, Decreased skin integrity, Impaired vision/preception, Improper body mechanics, Impaired perceived functional ability, Decreased knowledge of precautions, Cardiopulmonary status limiting activity, Decreased activity tolerance, Decreased knowledge of use of DME, Decreased strength, Impaired flexibility, Improper spinal/pelvic alignment, Impaired sensation, Difficulty walking, Decreased mobility, Decreased balance, Decreased cognition, Decreased range  of motion, Impaired tone, Impaired UE functional use, Decreased safety awareness, Decreased coordination, Pain  Visit Diagnosis: Unsteadiness on feet  Muscle weakness (generalized)  Abnormal posture  Stiffness of right shoulder, not elsewhere classified  Other symptoms and signs involving the nervous system  Stiffness of left shoulder, not elsewhere classified  Stiffness of left elbow, not elsewhere classified  Frontal lobe and executive function deficit    Problem List Patient Active Problem List   Diagnosis Date Noted  . Heterotopic ossification of bone 03/10/2016  . Acute on chronic respiratory failure with hypoxia ([place])   . Acute hypoxemic respiratory failure ([place])   . Acute blood loss anemia   . Atelectasis 02/04/2016  . Debility 02/03/2016  . Pneumonia of both lower lobes due to Pseudomonas species ([place])   . Pneumonia of both lower lobes due to methicillin susceptible Staphylococcus aureus (MSSA) ([place])   . Acute respiratory failure with hypoxia ([place])   . Acute encephalopathy   . Seizures ([place])   . Sepsis ([place])   . Hypoxia   . Pain   . HCAP (healthcare-associated pneumonia) 12/28/2015  . Chronic respiratory failure ([place]) 12/28/2015  . Acute tracheobronchitis 12/24/2015  . Tracheostomy tube present ([place])   . Tracheal stenosis   . Chronic respiratory failure with hypoxia ([place])   . 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 Tracheostomy status ([place]) 09/26/2015  . Allergic rhinitis 09/26/2015  . Viral encephalitis 09/22/2015  . Movement disorder 09/22/2015  . Encephalitis 09/22/2015  . Chest pain 02/07/2014  . Premature atrial contractions 01/13/2014  . Abnormality of gait 12/04/2013  . Hyperlipidemia 12/21/2011  . Cardiovascular risk factor 12/21/2011  . Bunion 01/31/2008  . Metatarsalgia of both feet 09/20/2007  . FLAT FOOT 09/20/2007  . HALLUX  RIGIDUS, ACQUIRED 09/20/2007    Quay Burow, OTR/L 07/01/2016, 3:01 PM  [facility] [address] [place], [place], [address] Phone: [phone]   Fax:  [phone]  Name: MARIANELA MANDRELL MRN: [id number] Date of Birth: 1950/05/18

## 2016-07-01 NOTE — Therapy (Signed)
[facility] [address] [place], [place], [address] Phone: [phone]   Fax:  [phone]  Physical Therapy Treatment  Patient Details  Name: Judy Spears MRN: [id number] Date of Birth: [birth year] Referring Provider: Alger Simons, MD/Andrew Letta Pate, MD  Encounter Date: 07/01/2016      PT End of Session - 07/01/16 1953    Visit Number 33   Number of Visits 47   Date for PT Re-Evaluation 08/23/16   Authorization Type BCBS   PT Start Time 1402   PT Stop Time 1450   PT Time Calculation (min) 48 min   Activity Tolerance Patient tolerated treatment well   Behavior During Therapy [facility] for tasks assessed/performed      Past Medical History:  Diagnosis Date  . Arthritis    lt great toe  . Complication of anesthesia    pt has had headaches post op-did advise to hydra well preop  . Hair loss    right-sided  . History of exercise stress test    a. ETT (12/15) with 12:00 exercise, no ST changes, occasional PACs.   . Hyperlipidemia   . Insomnia   . Migraine headache   . Movement disorder    resltess in left legs  . Postmenopausal   . Premature atrial contractions    a. Holter (12/15) with 8% PACs, no atrial runs or atrial fibrillation.     Past Surgical History:  Procedure Laterality Date  . BREAST BIOPSY  01/01/2011   Procedure: BREAST BIOPSY WITH NEEDLE LOCALIZATION;  Surgeon: Rolm Bookbinder, MD;  Location: [facility];  Service: General;  Laterality: Left;  left breast wire localization biopsy  . BREAST BIOPSY Right 07/27/2012  . cataracts right eye    . CESAREAN SECTION  1986  . HERNIA REPAIR  2000   RIH  . opirectinal membrane peel    . RHINOPLASTY  1983    There were no vitals filed for this visit.      Subjective Assessment - 07/01/16 1945    Subjective Caregiver reports that horseback riding went well Tuesday as they had taken a couple of weeks off.     Patient is accompained  by: Family member   Pertinent History Precautions:  PEG.  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  Another round of botox in LLE on 02/06/16   Limitations House hold activities;Walking;Standing;Sitting   Patient Stated Goals Per husband, "For [name] to be as independent as possible."   Currently in Pain? No/denies   Pain Score --  had left breast biopsy yesterday                         [facility] Adult PT Treatment/Exercise - 07/01/16 0001      Bed Mobility   Right Sidelying to Sit 4: Min guard   Right Sidelying to Sit Details (indicate cue type and reason) Tactile cues for improving use of RUE   Sit to Sidelying Right 4: Min guard   Sit to Sidelying Right Details (indicate cue type and reason) Min tactile cues for adjusting RUE once onto side.    Scooting to [facility] Details (indicate cue type and reason) Had pt perform sit>propped onto R and L elbow.  Performed each x 2 reps.  Note that she is able to perform  at S level today therefore assessed sit>SL R and SL R>sit and she was able to do with no more than min/guard and tactile cues for UE use.      Ambulation/Gait   Ambulation/Gait Yes   Ambulation/Gait Assistance 4: Min assist   Ambulation/Gait Assistance Details Pt ambulated today with B PFRW at min A level.  Min A on R hip for improved protraction and assist to guide RW, esp around turns.  Continued cues for "forward hips, chest up, and head back" as instructed by OT in previous session.  Pt ambulated full lap x 115' and note that she self corrected posture and forward LOB several times during this lap.  Cues for stepping RLE out to R as she tends to step R LE at midline and LLE too close to L wheel on RW.      Ambulation Distance (Feet) 115 Feet   Assistive device Bilateral platform walker    Gait Pattern Decreased stance time - right;Decreased hip/knee flexion - right;Decreased hip/knee flexion - left;Decreased weight shift to right;Scissoring;Ataxic   Ambulation Surface Level;Indoor     Neuro Re-ed    Neuro Re-ed Details  Performed forward/backwards weight shift with BUEs placed on large physioball.  Emphasis on keeping trunk flexed and slightly lifting hips and having controlled shift back to mat to carryover to sitting.  Note that once we went to sit from B PRRW with assist for UE placement, she was able to"walk" hands down walker and improve forward trunk flexion to sit controlled.                    PT Short Term Goals - 06/25/16 1205      PT SHORT TERM GOAL #1   Title Jay Back on pt's current manual wheelchair to be switched out to regular back; pt will demonstrate abilty to sit in wheelchair with upright with less supportive wheelchair back.  07-24-16   Baseline Jay Back on current wheelchair   Time 4   Period Weeks   Status New     PT SHORT TERM GOAL #2   Title Pt will perform sit to stand from wheelchair to RW with CGA.  07-24-16   Baseline min to mod assist needed with cues for positioning of feet and to lean forward - 06-24-16   Time 4   Period Weeks   Status New     PT SHORT TERM GOAL #3   Title Family to obtain bil. platform RW to begin assisting pt with ambulation in her home environment.  07-24-16   Baseline have been assessing ability to use bil. platform RW vs. UP walker   Time 4   Period Weeks   Status New     PT SHORT TERM GOAL #4   Title Family will report pt rolling from supine to Rt and Lt sides in hospital bed at home with supervision without use of bed rail with cues for positioning of LE's.  07-24-16   Baseline pt needs min assist at times - activity just demonstrated by pt on 06-24-16   Time 4   Period Weeks   Status New     PT SHORT TERM GOAL #5   Title Basic transfers from wheelchair to bed or commode with CGA.  07-24-16   Baseline  min to mod assist needed - 06-24-16   Time 4   Period Weeks     PT SHORT TERM GOAL #6   Title Amb. 31' with bil. platform  RW with CGA to begin amb. at home with assistance safely.  07-24-16   Baseline 115' with platform RW with min to mod assist   Time 4   Period Weeks   Status New           PT Long Term Goals - 06/25/16 1142      PT LONG TERM GOAL #1   Title Pt will perform supine rolling R and L at mod I level in order to indicate improved independence with bed mobility.  (Target Date: 06/25/16)/ 08-23-16  CONT. LTG til 08-23-16   Baseline inconsistent in performance - pt needs cues for LE flexion to assist with pushing for rolling and cues for UE positioning   Time 8   Period Weeks   Status On-going     PT LONG TERM GOAL #2   Title Pt will perform sit>SL R or L modified independently in order to indicate improved independence with getting into bed.   08-23-16   Baseline min to mod assist needed - 06-24-16   Time 8   Status New     PT LONG TERM GOAL #3   Title Pt will transfer Lt/Rt sidelying to sitting with CGA for incr. independence with getting out of bed.  08-23-16   Baseline min to mod assist needed -06-24-16   Time 8   Period Weeks   Status New     PT LONG TERM GOAL #4   Title Pt will perform basic transfers with CGA to indicate improved functional independence.  08-23-16   Baseline min assist needed - 06-24-16   Time 8   Period Weeks   Status New     PT LONG TERM GOAL #5   Title Pt will perform static standing with bil. UE support with distant supervision for >/= 10" for improved standing balance.  08-23-16   Baseline close SBA to CGA needed - pt standing with bil UE support for 8-10" per caregiver's report -- 06-24-16   Time 8   Period Weeks   Status New     Additional Long Term Goals   Additional Long Term Goals Yes     PT LONG TERM GOAL #6   Title Pt will demonstrate ability to ambulate up to 25' w/ LRAD at min A level in order to assist with getting around  home.  CONT LTG til 08-23-16; goal upgraded to 50'   Baseline bil. platform RW to be used in home   Time 8   Period Weeks   Status On-going     PT LONG TERM GOAL #7   Title Perform sit to stand and stand to sit to RW or to another object with CGA.  08-23-16   Baseline min assist needed - 06-24-16   Time 8   Period Weeks   Status New               Plan - 07/01/16 1954    Clinical Impression Statement Pt demonstrating marked improvement in ability to perform and maintain forward trunk lean/flexion with sit<>stand today and also note marked improvement with bed mobility transitions and ability to maintain and correct posture with gait.     Rehab Potential Fair   Clinical Impairments Affecting Rehab Potential Chronicity of condition, continued medical management, but has very strong family and financial support   PT Frequency 2x / week   PT Duration 8 weeks   PT Treatment/Interventions ADLs/Self Care Home Management;DME Instruction;Gait training;Stair training;Functional mobility training;Therapeutic activities;Patient/family education;Cognitive remediation;Neuromuscular re-education;Therapeutic  exercise;Balance training;Orthotic Fit/Training;Wheelchair mobility training;Manual techniques;Splinting;Visual/perceptual remediation/compensation;Vestibular;Passive range of motion;Electrical Stimulation   PT Next Visit Plan try technique for incr. upright posture with noodle behind back with use of straps around shoulders; try stretching supine on wedge for opening up rib cage - yoga block under hips if position tolerated   Consulted and Agree with Plan of Care Patient;Family member/caregiver   Family Member Consulted caregiver, [name] and husband [name]      Patient will benefit from skilled therapeutic intervention in order to improve the following deficits and impairments:  Abnormal gait, Decreased balance, Decreased endurance, Decreased cognition, Cardiopulmonary status limiting activity,  Decreased activity tolerance, Decreased coordination, Decreased strength, Impaired flexibility, Impaired tone, Decreased mobility, Increased muscle spasms, Impaired vision/preception, Decreased range of motion, Impaired UE functional use, Postural dysfunction, Improper body mechanics, Impaired perceived functional ability, Decreased knowledge of precautions, Decreased knowledge of use of DME  Visit Diagnosis: Unsteadiness on feet  Muscle weakness (generalized)  Abnormal posture  Other symptoms and signs involving the nervous system     Problem List Patient Active Problem List   Diagnosis Date Noted  . Heterotopic ossification of bone 03/10/2016  . Acute on chronic respiratory failure with hypoxia ([place])   . Acute hypoxemic respiratory failure ([place])   . Acute blood loss anemia   . Atelectasis 02/04/2016  . Debility 02/03/2016  . Pneumonia of both lower lobes due to Pseudomonas species ([place])   . Pneumonia of both lower lobes due to methicillin susceptible Staphylococcus aureus (MSSA) ([place])   . Acute respiratory failure with hypoxia ([place])   . Acute encephalopathy   . Seizures ([place])   . Sepsis ([place])   . Hypoxia   . Pain   . HCAP (healthcare-associated pneumonia) 12/28/2015  . Chronic respiratory failure ([place]) 12/28/2015  . Acute tracheobronchitis 12/24/2015  . Tracheostomy tube present ([place])   . Tracheal stenosis   . Chronic respiratory failure with hypoxia ([place])   . 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 Tracheostomy status ([place]) 09/26/2015  . Allergic rhinitis 09/26/2015  . Viral encephalitis 09/22/2015  . Movement disorder 09/22/2015  . Encephalitis 09/22/2015  . Chest pain 02/07/2014  . Premature atrial contractions 01/13/2014  . Abnormality of gait 12/04/2013  . Hyperlipidemia 12/21/2011  . Cardiovascular risk factor 12/21/2011  . Bunion 01/31/2008  . Metatarsalgia of both feet 09/20/2007  . FLAT FOOT 09/20/2007  . HALLUX RIGIDUS, ACQUIRED 09/20/2007    Cameron Sprang, PT, MPT [facility] [address] [place], [place], [address] Phone: [phone]   Fax:  [phone] 07/01/16, 7:57 PM  Name: Judy Spears MRN: [id number] Date of Birth: 11/24/1950

## 2016-07-02 NOTE — Telephone Encounter (Signed)
ORDER WRITTEN. thx

## 2016-07-05 ENCOUNTER — Ambulatory Visit: Payer: Medicare Other | Admitting: Rehabilitation

## 2016-07-05 ENCOUNTER — Other Ambulatory Visit: Payer: Self-pay | Admitting: Oncology

## 2016-07-05 ENCOUNTER — Ambulatory Visit: Payer: Medicare Other | Admitting: Occupational Therapy

## 2016-07-05 ENCOUNTER — Ambulatory Visit: Payer: Medicare Other | Admitting: Speech Pathology

## 2016-07-06 ENCOUNTER — Encounter: Payer: Self-pay | Admitting: Oncology

## 2016-07-06 ENCOUNTER — Encounter: Payer: Self-pay | Admitting: Radiation Oncology

## 2016-07-06 ENCOUNTER — Telehealth: Payer: Self-pay | Admitting: Oncology

## 2016-07-06 NOTE — Telephone Encounter (Signed)
Appt has been scheduled for the pt to see Dr. Jana Hakim on 6/20 at 4pm. Will mail the pt a letter. W/appt date and time.

## 2016-07-07 ENCOUNTER — Other Ambulatory Visit: Payer: Self-pay | Admitting: Family Medicine

## 2016-07-07 ENCOUNTER — Ambulatory Visit
Admission: RE | Admit: 2016-07-07 | Discharge: 2016-07-07 | Disposition: A | Discharge status: Home / Self Care | Payer: Medicare Other | Source: Ambulatory Visit | Attending: Family Medicine | Admitting: Family Medicine

## 2016-07-07 DIAGNOSIS — R921 Mammographic calcification found on diagnostic imaging of breast: Secondary | ICD-10-CM

## 2016-07-07 HISTORY — PX: BREAST BIOPSY: SHX20

## 2016-07-08 ENCOUNTER — Ambulatory Visit: Payer: Medicare Other | Admitting: Speech Pathology

## 2016-07-08 ENCOUNTER — Ambulatory Visit: Payer: Medicare Other | Admitting: Occupational Therapy

## 2016-07-08 ENCOUNTER — Encounter: Payer: Self-pay | Admitting: Occupational Therapy

## 2016-07-08 DIAGNOSIS — M6281 Muscle weakness (generalized): Secondary | ICD-10-CM

## 2016-07-08 DIAGNOSIS — M25612 Stiffness of left shoulder, not elsewhere classified: Secondary | ICD-10-CM

## 2016-07-08 DIAGNOSIS — R41844 Frontal lobe and executive function deficit: Secondary | ICD-10-CM

## 2016-07-08 DIAGNOSIS — R41841 Cognitive communication deficit: Secondary | ICD-10-CM

## 2016-07-08 DIAGNOSIS — R2681 Unsteadiness on feet: Secondary | ICD-10-CM

## 2016-07-08 DIAGNOSIS — R471 Dysarthria and anarthria: Secondary | ICD-10-CM | POA: Diagnosis not present

## 2016-07-08 DIAGNOSIS — G8250 Quadriplegia, unspecified: Secondary | ICD-10-CM

## 2016-07-08 DIAGNOSIS — R29818 Other symptoms and signs involving the nervous system: Secondary | ICD-10-CM

## 2016-07-08 DIAGNOSIS — R293 Abnormal posture: Secondary | ICD-10-CM

## 2016-07-08 DIAGNOSIS — R278 Other lack of coordination: Secondary | ICD-10-CM

## 2016-07-08 DIAGNOSIS — R1312 Dysphagia, oropharyngeal phase: Secondary | ICD-10-CM

## 2016-07-08 DIAGNOSIS — M25611 Stiffness of right shoulder, not elsewhere classified: Secondary | ICD-10-CM

## 2016-07-08 DIAGNOSIS — M25622 Stiffness of left elbow, not elsewhere classified: Secondary | ICD-10-CM

## 2016-07-08 NOTE — Therapy (Signed)
[facility] [address] [place], [place], [address] Phone: [phone]   Fax:  [phone]  Occupational Therapy Treatment  Patient Details  Name: Judy Spears MRN: [id number] Date of Birth: [birth year] Referring Provider: Dr. Quita Skye. [name]  Encounter Date: 07/08/2016      OT End of Session - 07/08/16 2129    Visit Number 33   Number of Visits 55   Date for OT Re-Evaluation 09/07/16   Authorization Type [employer] unlimited visits   OT Start Time 3845   OT Stop Time 1615   OT Time Calculation (min) 43 min   Activity Tolerance Patient limited by lethargy      Past Medical History:  Diagnosis Date  . Arthritis    lt great toe  . Complication of anesthesia    pt has had headaches post op-did advise to hydra well preop  . Hair loss    right-sided  . History of exercise stress test    a. ETT (12/15) with 12:00 exercise, no ST changes, occasional PACs.   . Hyperlipidemia   . Insomnia   . Migraine headache   . Movement disorder    resltess in left legs  . Postmenopausal   . Premature atrial contractions    a. Holter (12/15) with 8% PACs, no atrial runs or atrial fibrillation.     Past Surgical History:  Procedure Laterality Date  . BREAST BIOPSY  01/01/2011   Procedure: BREAST BIOPSY WITH NEEDLE LOCALIZATION;  Surgeon: Rolm Bookbinder, MD;  Location: [facility];  Service: General;  Laterality: Left;  left breast wire localization biopsy  . BREAST BIOPSY Right 07/27/2012  . cataracts right eye    . CESAREAN SECTION  1986  . HERNIA REPAIR  2000   RIH  . opirectinal membrane peel    . RHINOPLASTY  1983    There were no vitals filed for this visit.      Subjective Assessment - 07/08/16 2122    Subjective  Patient very lethargic today - just back from MD appointment at [facility]   Patient is accompained by: Family member   Pertinent History see epic snapshot - ABI/encephalitis/hypoxia from  virus; recent hospitalization due to ARF;    Currently in Pain? No/denies   Pain Score 0-No pain                      OT Treatments/Exercises (OP) - 07/08/16 0001      ADLs   Functional Mobility Worked on functional mobility- up on feet as patient very sleepy this session.  Worked on sit to stand, dynamic stand balance, and stand step turn transfers.       Neurological Re-education Exercises   Other Exercises 1 Addressed seated trunk mobility and postural control with emphasis on active trunk shortening to right side.  Patient quite lethargic today, having difficulty maintaining eyes open- just back from [facility] f/u for recent Lbreast cancer diagnosis.  Patient with red, irritated right eye, and signaling need to have her nose scratched eye wiped.  Worked with patient for use of left hand to wipe nose, eye with tissue as needed.  Right arm does not have sufficient range of motion, and patient tends to neglecct left arm/ ABle to repeat ad=fter initial hand over hand guidance.                  OT Short Term Goals - 07/08/16 2133      OT SHORT  TERM GOAL #1   Title Pt, husband and cargiver will be mod I with HEP - 03/29/2016   Status Achieved     OT SHORT TERM GOAL #2   Title Pt will be able to sit with supervision without UE support and vc's to assist with midline 75% while engage in functional task   Status Achieved     OT SHORT TERM GOAL #3   Title Pt will be able to complete sit to stand consistently with mod A for toilet transfers (max a for pivot)   Status Achieved     OT SHORT TERM GOAL #4   Title Pt, husband and caregiver will verbalize understanding of various bathroom accomodations to reduce long term caregiver burden   Status Achieved     OT SHORT TERM GOAL #5   Title Pt will be able to stand step transfer to toilet with mo more than mod a, mod vc's consistently using grab bar for community toileting needs- 06/10/2016 date adjusted as pt missed 2 weeks due  to hospitalization   Status Achieved     OT SHORT TERM GOAL #6   Title Pt will be able to wash face with supervision/max cues 50% of the time, min a 50% of the time.    Status Achieved     OT SHORT TERM GOAL #7   Title Pt will be able to don pull over shirt with mod a, max cues   Status Achieved     OT SHORT TERM GOAL #8   Title Pt will be able to maintain static standing with UE support with close supervision to reduce burden of care for caregivers    Status Achieved           OT Long Term Goals - 07/08/16 2134      OT LONG TERM GOAL #1   Title Pt, husband and caregiver will be mod I with upgraded HEP prn - 04/26/2016   Status Achieved     OT LONG TERM GOAL #2   Title Pt, husband and caregiver will be mod I with splint wear and care    Status Achieved     OT LONG TERM GOAL #3   Title Pt will be able to wash face with min a only   Status Achieved     OT LONG TERM GOAL #4   Title Pt will be able to sit without UE support with supevision 100% of the time with no more than 2 vc's while engaged in functional activity.   Status Achieved     OT LONG TERM GOAL #5   Title Pt, husband and caregiver will verbalize understanding of potential after care therapeutic opportunities. - 07/08/2016 date adjusted as pt missed 2 weeks due to hospitalization   Status Achieved     OT LONG TERM GOAL #6   Title Pt will be supervision to wash face consistently   Status Achieved     OT LONG TERM GOAL #7   Title Pt will don shirt with min a and max vc's 50% of the time   Status On-going     OT LONG TERM GOAL #8   Title Pt will require min a for stand step transfers to toilet using grab bar for community toileting at least 75% of the time.   Status On-going     OT LONG TERM GOAL  #9   Baseline Pt will stand with contact gurard with only 1 UE support for static standing balance in prep  for abiilty to engage in functional task while standing.    Status Achieved               Plan -  07/08/16 2130    Clinical Impression Statement Patient continues to benefit from OT intervention, and family reports increased independence with ADL.  Patient's current plan of care complicated by recent diagnosis of left breast cancer - and anticipate potential treatment / surgery to address this issue.     Rehab Potential Fair   Current Impairments/barriers affecting progress: severity of deficits, very slow rate of recovery   OT Frequency 2x / week   OT Duration 8 weeks   OT Treatment/Interventions Self-care/ADL training;Ultrasound;Moist Heat;Electrical Stimulation;DME and/or AE instruction;Neuromuscular education;Therapeutic exercise;Functional Mobility Training;Manual Therapy;Passive range of motion;Splinting;Therapeutic activities;Balance training;Patient/family education;Visual/perceptual remediation/compensation;Cognitive remediation/compensation   Plan NMR trunk and UE's, functioanl mobility   OT Home Exercise Plan Reviewed plan for home management for left elbow range of motion - JAS 3x/day FOR 30 min./time, and locked elbow extesnion brace at night   Consulted and Agree with Plan of Care Patient;Family member/caregiver   Family Member Consulted caregiver [name] , husband [name]      Patient will benefit from skilled therapeutic intervention in order to improve the following deficits and impairments:  Decreased endurance, Decreased skin integrity, Impaired vision/preception, Improper body mechanics, Impaired perceived functional ability, Decreased knowledge of precautions, Cardiopulmonary status limiting activity, Decreased activity tolerance, Decreased knowledge of use of DME, Decreased strength, Impaired flexibility, Improper spinal/pelvic alignment, Impaired sensation, Difficulty walking, Decreased mobility, Decreased balance, Decreased cognition, Decreased range of motion, Impaired tone, Impaired UE functional use, Decreased safety awareness, Decreased coordination, Pain  Visit  Diagnosis: 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[place]) - Plan: Ot plan of care cert/re-cert  Unsteadiness on feet - Plan: Ot plan of care cert/re-cert  Muscle weakness (generalized) - Plan: Ot plan of care cert/re-cert  Abnormal posture - Plan: Ot plan of care cert/re-cert  Stiffness of right shoulder, not elsewhere classified - Plan: Ot plan of care cert/re-cert  Other symptoms and signs involving the nervous system - Plan: Ot plan of care cert/re-cert  Stiffness of left shoulder, not elsewhere classified - Plan: Ot plan of care cert/re-cert  Stiffness of left elbow, not elsewhere classified - Plan: Ot plan of care cert/re-cert  Frontal lobe and executive function deficit - Plan: Ot plan of care cert/re-cert  Other lack of coordination - Plan: Ot plan of care cert/re-cert    Problem List Patient Active Problem List   Diagnosis Date Noted  . Heterotopic ossification of bone 03/10/2016  . Acute on chronic respiratory failure with hypoxia ([place])   . Acute hypoxemic respiratory failure ([place])   . Acute blood loss anemia   . Atelectasis 02/04/2016  . Debility 02/03/2016  . Pneumonia of both lower lobes due to Pseudomonas species ([place])   . Pneumonia of both lower lobes due to methicillin susceptible Staphylococcus aureus (MSSA) ([place])   . Acute respiratory failure with hypoxia ([place])   . Acute encephalopathy   . Seizures ([place])   . Sepsis ([place])   . Hypoxia   . Pain   . HCAP (healthcare-associated pneumonia) 12/28/2015  . Chronic respiratory failure ([place]) 12/28/2015  . Acute tracheobronchitis 12/24/2015  . Tracheostomy tube present ([place])   . Tracheal stenosis   . Chronic respiratory failure with hypoxia ([place])   . 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 Tracheostomy status ([place]) 09/26/2015  . Allergic rhinitis 09/26/2015  . Viral encephalitis 09/22/2015  . Movement disorder 09/22/2015  . Encephalitis  09/22/2015  . Chest pain 02/07/2014  . Premature atrial contractions 01/13/2014  .  Abnormality of gait 12/04/2013  . Hyperlipidemia 12/21/2011  . Cardiovascular risk factor 12/21/2011  . Bunion 01/31/2008  . Metatarsalgia of both feet 09/20/2007  . FLAT FOOT 09/20/2007  . HALLUX RIGIDUS, ACQUIRED 09/20/2007    Judy Spears 07/08/2016, 9:41 PM  [facility] [address] [place], [place], [address] Phone: [phone]   Fax:  [phone]  Name: Judy Spears MRN: [id number] Date of Birth: September 20, 1950

## 2016-07-09 ENCOUNTER — Ambulatory Visit: Payer: Medicare Other | Admitting: Physical Therapy

## 2016-07-09 ENCOUNTER — Encounter: Payer: Self-pay | Admitting: Occupational Therapy

## 2016-07-09 ENCOUNTER — Ambulatory Visit: Payer: Medicare Other | Admitting: Occupational Therapy

## 2016-07-09 DIAGNOSIS — R29818 Other symptoms and signs involving the nervous system: Secondary | ICD-10-CM

## 2016-07-09 DIAGNOSIS — R293 Abnormal posture: Secondary | ICD-10-CM

## 2016-07-09 DIAGNOSIS — M25611 Stiffness of right shoulder, not elsewhere classified: Secondary | ICD-10-CM

## 2016-07-09 DIAGNOSIS — M25612 Stiffness of left shoulder, not elsewhere classified: Secondary | ICD-10-CM

## 2016-07-09 DIAGNOSIS — R278 Other lack of coordination: Secondary | ICD-10-CM

## 2016-07-09 DIAGNOSIS — M6281 Muscle weakness (generalized): Secondary | ICD-10-CM

## 2016-07-09 DIAGNOSIS — R41844 Frontal lobe and executive function deficit: Secondary | ICD-10-CM

## 2016-07-09 DIAGNOSIS — R2689 Other abnormalities of gait and mobility: Secondary | ICD-10-CM

## 2016-07-09 DIAGNOSIS — M25622 Stiffness of left elbow, not elsewhere classified: Secondary | ICD-10-CM

## 2016-07-09 DIAGNOSIS — G8250 Quadriplegia, unspecified: Secondary | ICD-10-CM

## 2016-07-09 DIAGNOSIS — R471 Dysarthria and anarthria: Secondary | ICD-10-CM | POA: Diagnosis not present

## 2016-07-09 DIAGNOSIS — R2681 Unsteadiness on feet: Secondary | ICD-10-CM

## 2016-07-09 NOTE — Therapy (Signed)
[facility] [address] [place], [place], [address] Phone: [phone]   Fax:  [phone]  Speech Language Pathology Treatment  Patient Details  Name: Judy Spears MRN: [id number] Date of Birth: [birth year] Referring Provider: Charlett Blake, MD  Encounter Date: 07/08/2016      End of Session - 07/08/16 1618    Visit Number 34   Number of Visits 41   Date for SLP Re-Evaluation 07/30/16   SLP Start Time 1618   SLP Stop Time  1703   SLP Time Calculation (min) 45 min   Activity Tolerance Patient tolerated treatment well;Patient limited by lethargy      Past Medical History:  Diagnosis Date  . Arthritis    lt great toe  . Complication of anesthesia    pt has had headaches post op-did advise to hydra well preop  . Hair loss    right-sided  . History of exercise stress test    a. ETT (12/15) with 12:00 exercise, no ST changes, occasional PACs.   . Hyperlipidemia   . Insomnia   . Migraine headache   . Movement disorder    resltess in left legs  . Postmenopausal   . Premature atrial contractions    a. Holter (12/15) with 8% PACs, no atrial runs or atrial fibrillation.     Past Surgical History:  Procedure Laterality Date  . BREAST BIOPSY  01/01/2011   Procedure: BREAST BIOPSY WITH NEEDLE LOCALIZATION;  Surgeon: Rolm Bookbinder, MD;  Location: [facility];  Service: General;  Laterality: Left;  left breast wire localization biopsy  . BREAST BIOPSY Right 07/27/2012  . cataracts right eye    . CESAREAN SECTION  1986  . HERNIA REPAIR  2000   RIH  . opirectinal membrane peel    . RHINOPLASTY  1983    There were no vitals filed for this visit.      Subjective Assessment - 07/08/16 1623    Subjective "We've been dealing with a little breast cancer this week." husband re: recent diagnosis at [facility]   Special Tests spouse, [name]; caregiver [name]   Currently in Pain? No/denies                ADULT SLP TREATMENT - 07/08/16 1624      General Information   Behavior/Cognition Alert;Cooperative;Pleasant mood   Patient Positioning Upright in chair   Oral care provided Yes     Treatment Provided   Treatment provided Dysphagia;Cognitive-Linquistic     Dysphagia Treatment   Temperature Spikes Noted No   Respiratory Status Room air   Oral Cavity - Dentition Adequate natural dentition   Treatment Methods Skilled observation;Compensation strategy training;Therapeutic exercise   Patient observed directly with PO's Yes   Type of PO's observed Thin liquids   Feeding Needs assist   Liquids provided via Cup   Pharyngeal Phase Signs & Symptoms Multiple swallows;Delayed cough   Type of cueing Verbal;Visual   Amount of cueing Moderate   Other treatment/comments SLP performed oral care to maximize safety for PO trials. Pt self-fed thin liquids via cup sip; SLP provided assistance for rate control, limiting to 1-2 sips. No overt signs of aspiration noted with single sips. With multiple sips, pt with delayed cough. Recommended caregivers continue to provide assistance for rate control with liquids.     Pain Assessment   Pain Assessment No/denies pain     Cognitive-Linquistic Treatment   Treatment focused on Other (comment);Voice  Respiratory muscle  strength training   Skilled Treatment SLP reviewed pt's home exercise program for RMT; pt initially required mod cues for appropriate technique with RMT. SLP faded support; pt able to complete 3 sets of 5 repetitions. Pt required increased cuing initially for inspiratory training. SLP reduced resistance to improve pt's technique, gradually building up to 15cm pressure, a slight reduction from initial target.      Tracheostomy Shiley 4 mm Uncuffed   Tracheostomy Properties Placement Date: 05/08/16 Placement Time: 1030 Placed By: ICU physician Brand: Shiley Size (mm): 4 mm Style: Uncuffed   Status Capped   Site Assessment  Clean;Dry   Ties Assessment Clean;Dry;Secure     Assessment / Recommendations / Plan   Plan Continue with current plan of care     Dysphagia Recommendations   Diet recommendations Dysphagia 1 (puree);Thin liquid   Liquids provided via Teaspoon;Cup   Medication Administration Via alternative means   Supervision Full supervision/cueing for compensatory strategies   Compensations Effortful swallow;Multiple dry swallows after each bite/sip;Check for anterior loss;Small sips/bites;Slow rate;Check for pocketing   Postural Changes and/or Swallow Maneuvers Seated upright 90 degrees     General Recommendations   Oral Care Recommendations Oral care before and after PO;Staff/trained caregiver to provide oral care     Progression Toward Goals   Progression toward goals Progressing toward goals          SLP Education - 07/08/16 1618    Education Details RMT   Person(s) Educated Patient;Spouse;Caregiver(s)   Methods Explanation;Demonstration;Verbal cues   Comprehension Verbalized understanding;Returned demonstration;Verbal cues required;Tactile cues required;Need further instruction          SLP Short Term Goals - 07/09/16 0617      SLP SHORT TERM GOAL #1   Title Patient will utilize intelligibility strategies with moderate visual cues from communication partner/visual aid to communicate 5-7 word wants, needs, thoughts and ideas.   Status Achieved     SLP SHORT TERM GOAL #2   Title Patient will utilize intelligibility strategies, breath support with mod A for phrase repetition in 75% of trials.   Status Achieved     SLP SHORT TERM GOAL #3   Title Pt will swallow thin liquids, purees and soft solids in 9/10 trials with no overt signs of aspiration.   Status Achieved     SLP SHORT TERM GOAL #4   Title Patient will tolerate 6oz water, >95% with no overt signs of aspiration.   Status Achieved     SLP SHORT TERM GOAL #5   Title Patient will utilize verbal cues for lip closure, dry  swallow for secretion management in 75% of opportunities.   Status Achieved          SLP Long Term Goals - 07/09/16 9417      SLP LONG TERM GOAL #1   Title Patient will utilize multimodal communication/AAC to clarify wants, needs, thoughts or ideas during communication breakdowns with max A   Status Deferred     SLP LONG TERM GOAL #2   Title Patient will utilize intelligibility strategies, breath support with min A visual cues for 5-7 word responses with 80% accuracy.   Baseline 06/17/16   Time 3   Period Weeks   Status On-going     SLP LONG TERM GOAL #3   Title Patient will demonstrate readiness for MBS to determine safety for pleasure feeds by timely initiation of swallow in 3/5 trials with ice chips/H20.   Baseline 2.12.18   Status Achieved     SLP  LONG TERM GOAL #4   Title Pt will consume pleasure feeds of desired consistency (soft solids and thin liquids) with signs of aspiration in fewer than 10% of trials with caregiver providing moderate cues and monitoring for overt signs of aspiration.   Time 4   Period Weeks   Status On-going     SLP LONG TERM GOAL #5   Title Patient will utilize visual cue for lip closure, dry swallow for secretion management in 75% of opportunities.   Status Achieved     SLP LONG TERM GOAL #6   Title Pt will demo HEP for RMT, dysphagia with mod caregiver assistance.   Time 3   Period Weeks   Status On-going          Plan - 07/08/16 0615    Clinical Impression Statement Pt requires intermittent min cues for alertness today; she is able to complete RMT expiratory and inspiratory training exercises with mod cues. Pt, family and caregiver continue to demonstrate motivation to improve, though they have not been compliant with current diet recommendations of dys 1, thin liquids. Pt is currently consuming mechanical soft textures at home. Pt will benefit from continued skilled ST to address underlying physical impairments impacting swallowing and  speech, as well as education and training in monitoring for and reducing risks of aspiration.   Speech Therapy Frequency 2x / week   Duration 4 weeks   Treatment/Interventions Aspiration precaution training;Trials of upgraded texture/liquids;Compensatory strategies;Functional tasks;Patient/family education;Cueing hierarchy;Diet toleration management by SLP;Cognitive reorganization;Compensatory techniques;SLP instruction and feedback;Internal/external aids;Multimodal communcation approach;Pharyngeal strengthening exercises   Potential to Achieve Goals Good   Potential Considerations Ability to learn/carryover information;Co-morbidities;Severity of impairments   SLP Home Exercise Plan Pleasure feeds purees, thin liquids, strict oral care before and after PO, monitor for signs of fatigue, aspiration. RMT   Consulted and Agree with Plan of Care Family member/caregiver;Patient   Family Member Consulted husband chip, caregiver lisa      Patient will benefit from skilled therapeutic intervention in order to improve the following deficits and impairments:   Dysphagia, oropharyngeal phase  Dysarthria and anarthria  Cognitive communication deficit    Problem List Patient Active Problem List   Diagnosis Date Noted  . Heterotopic ossification of bone 03/10/2016  . Acute on chronic respiratory failure with hypoxia ([place])   . Acute hypoxemic respiratory failure ([place])   . Acute blood loss anemia   . Atelectasis 02/04/2016  . Debility 02/03/2016  . Pneumonia of both lower lobes due to Pseudomonas species ([place])   . Pneumonia of both lower lobes due to methicillin susceptible Staphylococcus aureus (MSSA) ([place])   . Acute respiratory failure with hypoxia ([place])   . Acute encephalopathy   . Seizures ([place])   . Sepsis ([place])   . Hypoxia   . Pain   . HCAP (healthcare-associated pneumonia) 12/28/2015  . Chronic respiratory failure ([place]) 12/28/2015  . Acute tracheobronchitis 12/24/2015  . Tracheostomy  tube present ([place])   . Tracheal stenosis   . Chronic respiratory failure with hypoxia ([place])   . 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 Tracheostomy status ([place]) 09/26/2015  . Allergic rhinitis 09/26/2015  . Viral encephalitis 09/22/2015  . Movement disorder 09/22/2015  . Encephalitis 09/22/2015  . Chest pain 02/07/2014  . Premature atrial contractions 01/13/2014  . Abnormality of gait 12/04/2013  . Hyperlipidemia 12/21/2011  . Cardiovascular risk factor 12/21/2011  . Bunion 01/31/2008  . Metatarsalgia of both feet 09/20/2007  . FLAT FOOT 09/20/2007  . HALLUX RIGIDUS, ACQUIRED 09/20/2007  Deneise Lever, [place], CCC-SLP Speech-Language Pathologist  Aliene Altes 07/09/2016, 6:19 AM  [facility] [address] [place], [place], [address] Phone: [phone]   Fax:  [phone]   Name: Judy Spears MRN: [id number] Date of Birth: 09-11-1950

## 2016-07-09 NOTE — Therapy (Signed)
[facility] [address] [place], [place], [address] Phone: [phone]   Fax:  [phone]  Occupational Therapy Treatment  Patient Details  Name: Judy Spears MRN: [id number] Date of Birth: 11/29/1950 Referring Provider: Dr. Quita Skye. [name]  Encounter Date: 07/09/2016      OT End of Session - 07/09/16 1339    Visit Number 34   Number of Visits 74   Date for OT Re-Evaluation 09/07/16   Authorization Type BCBS unlimited visits   OT Start Time 1230   OT Stop Time 1315   OT Time Calculation (min) 45 min   Activity Tolerance Patient tolerated treatment well   Behavior During Therapy [facility] for tasks assessed/performed      Past Medical History:  Diagnosis Date  . Arthritis    lt great toe  . Complication of anesthesia    pt has had headaches post op-did advise to hydra well preop  . Hair loss    right-sided  . History of exercise stress test    a. ETT (12/15) with 12:00 exercise, no ST changes, occasional PACs.   . Hyperlipidemia   . Insomnia   . Migraine headache   . Movement disorder    resltess in left legs  . Postmenopausal   . Premature atrial contractions    a. Holter (12/15) with 8% PACs, no atrial runs or atrial fibrillation.     Past Surgical History:  Procedure Laterality Date  . BREAST BIOPSY  01/01/2011   Procedure: BREAST BIOPSY WITH NEEDLE LOCALIZATION;  Surgeon: Rolm Bookbinder, MD;  Location: [facility];  Service: General;  Laterality: Left;  left breast wire localization biopsy  . BREAST BIOPSY Right 07/27/2012  . cataracts right eye    . CESAREAN SECTION  1986  . HERNIA REPAIR  2000   RIH  . opirectinal membrane peel    . RHINOPLASTY  1983    There were no vitals filed for this visit.      Subjective Assessment - 07/09/16 1330    Subjective  Patient with right eye redness- frequently closing eyes to aide tearing.     Patient is accompained by: Family member    Pertinent History see epic snapshot - ABI/encephalitis/hypoxia from virus; recent hospitalization due to ARF;    Patient Stated Goals Pt unable to state.    Currently in Pain? No/denies   Pain Score 0-No pain                      OT Treatments/Exercises (OP) - 07/09/16 0001      Neurological Re-education Exercises   Other Exercises 1 Neuromuscular reeducation to address postural control and correction for static to dynamic stand balance.  Initially worked with de-emphasizing need for UE activation for stand balance.  Once aptient able to balance self in standing - with eweight on both legs and knees realxed from locke dposition, began to initiate UE isolated motion.  Patient able to remove hands from pockets, catch a beach ball, pass a beach ball, toss a beach ball.  Worked on incorporating arms engaged in activity during transitional movements stand to sit, sit to stand, squat with rotation.                   OT Education - 07/09/16 1338    Education provided Yes   Education Details use of arms for function in standing   Person(s) Educated Patient;Spouse;Caregiver(s)   Methods Explanation;Demonstration;Tactile cues;Verbal cues  Comprehension Need further instruction          OT Short Term Goals - 07/08/16 2133      OT SHORT TERM GOAL #1   Title Pt, husband and cargiver will be mod I with HEP - 03/29/2016   Status Achieved     OT SHORT TERM GOAL #2   Title Pt will be able to sit with supervision without UE support and vc's to assist with midline 75% while engage in functional task   Status Achieved     OT SHORT TERM GOAL #3   Title Pt will be able to complete sit to stand consistently with mod A for toilet transfers (max a for pivot)   Status Achieved     OT SHORT TERM GOAL #4   Title Pt, husband and caregiver will verbalize understanding of various bathroom accomodations to reduce long term caregiver burden   Status Achieved     OT SHORT TERM GOAL  #5   Title Pt will be able to stand step transfer to toilet with mo more than mod a, mod vc's consistently using grab bar for community toileting needs- 06/10/2016 date adjusted as pt missed 2 weeks due to hospitalization   Status Achieved     OT SHORT TERM GOAL #6   Title Pt will be able to wash face with supervision/max cues 50% of the time, min a 50% of the time.    Status Achieved     OT SHORT TERM GOAL #7   Title Pt will be able to don pull over shirt with mod a, max cues   Status Achieved     OT SHORT TERM GOAL #8   Title Pt will be able to maintain static standing with UE support with close supervision to reduce burden of care for caregivers    Status Achieved           OT Long Term Goals - 07/08/16 2134      OT LONG TERM GOAL #1   Title Pt, husband and caregiver will be mod I with upgraded HEP prn - 04/26/2016   Status Achieved     OT LONG TERM GOAL #2   Title Pt, husband and caregiver will be mod I with splint wear and care    Status Achieved     OT LONG TERM GOAL #3   Title Pt will be able to wash face with min a only   Status Achieved     OT LONG TERM GOAL #4   Title Pt will be able to sit without UE support with supevision 100% of the time with no more than 2 vc's while engaged in functional activity.   Status Achieved     OT LONG TERM GOAL #5   Title Pt, husband and caregiver will verbalize understanding of potential after care therapeutic opportunities. - 07/08/2016 date adjusted as pt missed 2 weeks due to hospitalization   Status Achieved     OT LONG TERM GOAL #6   Title Pt will be supervision to wash face consistently   Status Achieved     OT LONG TERM GOAL #7   Title Pt will don shirt with min a and max vc's 50% of the time   Status On-going     OT LONG TERM GOAL #8   Title Pt will require min a for stand step transfers to toilet using grab bar for community toileting at least 75% of the time.   Status On-going  OT LONG TERM GOAL  #9   Baseline  Pt will stand with contact gurard with only 1 UE support for static standing balance in prep for abiilty to engage in functional task while standing.    Status Achieved               Plan - 07/09/16 1339    Clinical Impression Statement Patient continues to benefit from OT services due to steady improvement with functional mobility, and ADL.  Patient's current plan of care complicated by recent breast cancer diagnosis and anticipated intervention(s)   Rehab Potential Fair   Current Impairments/barriers affecting progress: severity of deficits, very slow rate of recovery   OT Frequency 2x / week   OT Duration 8 weeks   OT Treatment/Interventions Self-care/ADL training;Ultrasound;Moist Heat;Electrical Stimulation;DME and/or AE instruction;Neuromuscular education;Therapeutic exercise;Functional Mobility Training;Manual Therapy;Passive range of motion;Splinting;Therapeutic activities;Balance training;Patient/family education;Visual/perceptual remediation/compensation;Cognitive remediation/compensation   Plan NMR Trunk and UE's, functional mobility   OT Home Exercise Plan Reviewed plan for home management for left elbow range of motion - JAS 3x/day FOR 30 min./time, and locked elbow extesnion brace at night   Consulted and Agree with Plan of Care Patient;Family member/caregiver   Family Member Consulted caregiver [name] , husband [name]      Patient will benefit from skilled therapeutic intervention in order to improve the following deficits and impairments:  Decreased endurance, Decreased skin integrity, Impaired vision/preception, Improper body mechanics, Impaired perceived functional ability, Decreased knowledge of precautions, Cardiopulmonary status limiting activity, Decreased activity tolerance, Decreased knowledge of use of DME, Decreased strength, Impaired flexibility, Improper spinal/pelvic alignment, Impaired sensation, Difficulty walking, Decreased mobility, Decreased balance, Decreased  cognition, Decreased range of motion, Impaired tone, Impaired UE functional use, Decreased safety awareness, Decreased coordination, Pain  Visit Diagnosis: 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HCC)  Unsteadiness on feet  Muscle weakness (generalized)  Abnormal posture  Stiffness of right shoulder, not elsewhere classified  Other symptoms and signs involving the nervous system  Stiffness of left shoulder, not elsewhere classified  Stiffness of left elbow, not elsewhere classified  Frontal lobe and executive function deficit  Other lack of coordination    Problem List Patient Active Problem List   Diagnosis Date Noted  . Heterotopic ossification of bone 03/10/2016  . Acute on chronic respiratory failure with hypoxia ([place])   . Acute hypoxemic respiratory failure ([place])   . Acute blood loss anemia   . Atelectasis 02/04/2016  . Debility 02/03/2016  . Pneumonia of both lower lobes due to Pseudomonas species ([place])   . Pneumonia of both lower lobes due to methicillin susceptible Staphylococcus aureus (MSSA) ([place])   . Acute respiratory failure with hypoxia ([place])   . Acute encephalopathy   . Seizures ([place])   . Sepsis ([place])   . Hypoxia   . Pain   . HCAP (healthcare-associated pneumonia) 12/28/2015  . Chronic respiratory failure ([place]) 12/28/2015  . Acute tracheobronchitis 12/24/2015  . Tracheostomy tube present ([place])   . Tracheal stenosis   . Chronic respiratory failure with hypoxia ([place])   . 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[place]) 10/20/2015  . Tracheostomy status ([place]) 09/26/2015  . Allergic rhinitis 09/26/2015  . Viral encephalitis 09/22/2015  . Movement disorder 09/22/2015  . Encephalitis 09/22/2015  . Chest pain 02/07/2014  . Premature atrial contractions 01/13/2014  . Abnormality of gait 12/04/2013  . Hyperlipidemia 12/21/2011  . Cardiovascular risk factor 12/21/2011  . Bunion 01/31/2008  . Metatarsalgia of both feet 09/20/2007  . FLAT FOOT 09/20/2007  . HALLUX RIGIDUS, ACQUIRED 09/20/2007  Mariah Milling, OTR/L 07/09/2016, 1:42 PM  [facility] [address] [place], [place], [address] Phone: [phone]   Fax:  [phone]  Name: LEXI CONATY MRN: [id number] Date of Birth: 05-23-1950

## 2016-07-10 NOTE — Therapy (Signed)
[facility] [address] [place], [place], [address] Phone: [phone]   Fax:  [phone]  Physical Therapy Treatment  Patient Details  Name: Judy Spears MRN: [id number] Date of Birth: July 09, 1950 Referring Provider: Alger Simons, MD/Andrew Letta Pate, MD  Encounter Date: 07/09/2016      PT End of Session - 07/10/16 1205    Visit Number 34   Number of Visits 35   Date for PT Re-Evaluation 08/23/16   Authorization Type BCBS   PT Start Time 1147   PT Stop Time 1232   PT Time Calculation (min) 45 min      Past Medical History:  Diagnosis Date  . Arthritis    lt great toe  . Complication of anesthesia    pt has had headaches post op-did advise to hydra well preop  . Hair loss    right-sided  . History of exercise stress test    a. ETT (12/15) with 12:00 exercise, no ST changes, occasional PACs.   . Hyperlipidemia   . Insomnia   . Migraine headache   . Movement disorder    resltess in left legs  . Postmenopausal   . Premature atrial contractions    a. Holter (12/15) with 8% PACs, no atrial runs or atrial fibrillation.     Past Surgical History:  Procedure Laterality Date  . BREAST BIOPSY  01/01/2011   Procedure: BREAST BIOPSY WITH NEEDLE LOCALIZATION;  Surgeon: Rolm Bookbinder, MD;  Location: [facility];  Service: General;  Laterality: Left;  left breast wire localization biopsy  . BREAST BIOPSY Right 07/27/2012  . cataracts right eye    . CESAREAN SECTION  1986  . HERNIA REPAIR  2000   RIH  . opirectinal membrane peel    . RHINOPLASTY  1983    There were no vitals filed for this visit.      Subjective Assessment - 07/10/16 1142    Subjective Caregiver, [name], reports that pt's Rt eye was bothering her yesterday but seems to be better today; states pt was keeping eyes closed during OT yesterday - says she did sleep well last night   Patient is accompained by: Family member   Pertinent History Precautions:  PEG.  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  Another round of botox in LLE on 02/06/16   Patient Stated Goals Per husband, "For [place] to be as independent as possible."   Currently in Pain? No/denies                         [facility] Adult PT Treatment/Exercise - 07/10/16 0001      Bed Mobility   Rolling Right 4: Min guard   Rolling Right Details (indicate cue type and reason) cues to bring LUE across chest; cues for LE positioning   Rolling Left 4: Min guard  cues to turn head and move RUE across body   Right Sidelying to Sit 4: Min assist   Sit to Sidelying Right 4: Min assist     Ambulation/Gait   Ambulation/Gait Yes   Ambulation/Gait Assistance 4: Min assist   Ambulation/Gait Assistance Details tactile cues on Rt and Lt lateral trunk to facilitate trunk elongation wtih verbal cues to keep "belly forward" and to stand erect; mod assist needed to negotiate RW around  curve of track   Ambulation Distance (Feet) 115 Feet   Assistive device Bilateral platform walker   Gait Pattern Decreased stance time - right;Decreased hip/knee flexion - right;Decreased hip/knee flexion - left;Decreased weight shift to right;Scissoring;Ataxic   Ambulation Surface Level;Indoor     Exercises   Exercises Lumbar     Knee/Hip Exercises: Supine   Other Supine Knee/Hip Exercises bridging  x 10 reps     Knee/Hip Exercises: Sidelying   Hip ABduction AROM;Both;1 set;10 reps      Pt performed trunk stretching by lying supine with use of wedge under thoracic and cervical spine - yoga block used  Under pt's hip - pt performed bridging  SLR each leg - 10 reps each   Printed out order for platform RW and gave to pt's husband, [name] to obtain from [facility] to begin using this device  at home With assistance with amb.         PT Short Term Goals - 06/25/16 1205      PT SHORT TERM GOAL #1   Title Jay Back on pt's current manual wheelchair to be switched out to regular back; pt will demonstrate abilty to sit in wheelchair with upright with less supportive wheelchair back.  07-24-16   Baseline Jay Back on current wheelchair   Time 4   Period Weeks   Status New     PT SHORT TERM GOAL #2   Title Pt will perform sit to stand from wheelchair to RW with CGA.  07-24-16   Baseline min to mod assist needed with cues for positioning of feet and to lean forward - 06-24-16   Time 4   Period Weeks   Status New     PT SHORT TERM GOAL #3   Title Family to obtain bil. platform RW to begin assisting pt with ambulation in her home environment.  07-24-16   Baseline have been assessing ability to use bil. platform RW vs. UP walker   Time 4   Period Weeks   Status New     PT SHORT TERM GOAL #4   Title Family will report pt rolling from supine to Rt and Lt sides in hospital bed at home with supervision without use of bed rail with cues for positioning of LE's.  07-24-16   Baseline pt needs min assist at times - activity just demonstrated by pt on 06-24-16   Time 4   Period Weeks   Status New     PT SHORT TERM GOAL #5   Title Basic transfers from wheelchair to bed or commode with CGA.  07-24-16   Baseline min to mod assist needed - 06-24-16   Time 4   Period Weeks     PT SHORT TERM GOAL #6   Title Amb. 22' with bil. platform RW with CGA to begin amb. at home with assistance safely.  07-24-16   Baseline 115' with platform RW with min to mod assist   Time 4   Period Weeks   Status New           PT Long Term Goals - 06/25/16 1142      PT LONG TERM GOAL #1   Title Pt will perform supine rolling R and L at mod I level in order to indicate improved independence with bed mobility.  (Target Date: 06/25/16)/ 08-23-16  CONT. LTG til 08-23-16   Baseline inconsistent in performance -  pt needs cues for LE flexion to assist with pushing for rolling and  cues for UE positioning   Time 8   Period Weeks   Status On-going     PT LONG TERM GOAL #2   Title Pt will perform sit>SL R or L modified independently in order to indicate improved independence with getting into bed.   08-23-16   Baseline min to mod assist needed - 06-24-16   Time 8   Status New     PT LONG TERM GOAL #3   Title Pt will transfer Lt/Rt sidelying to sitting with CGA for incr. independence with getting out of bed.  08-23-16   Baseline min to mod assist needed -06-24-16   Time 8   Period Weeks   Status New     PT LONG TERM GOAL #4   Title Pt will perform basic transfers with CGA to indicate improved functional independence.  08-23-16   Baseline min assist needed - 06-24-16   Time 8   Period Weeks   Status New     PT LONG TERM GOAL #5   Title Pt will perform static standing with bil. UE support with distant supervision for >/= 10" for improved standing balance.  08-23-16   Baseline close SBA to CGA needed - pt standing with bil UE support for 8-10" per caregiver's report -- 06-24-16   Time 8   Period Weeks   Status New     Additional Long Term Goals   Additional Long Term Goals Yes     PT LONG TERM GOAL #6   Title Pt will demonstrate ability to ambulate up to 25' w/ LRAD at min A level in order to assist with getting around home.  CONT LTG til 08-23-16; goal upgraded to 50'   Baseline bil. platform RW to be used in home   Time 8   Period Weeks   Status On-going     PT LONG TERM GOAL #7   Title Perform sit to stand and stand to sit to RW or to another object with CGA.  08-23-16   Baseline min assist needed - 06-24-16   Time 8   Period Weeks   Status New               Plan - 07/10/16 1206    Clinical Impression Statement Pt had more difficulty with amb. today compared to performance in previous PT session on 07-01-16; RLE noted to adduct/lightly scissor more today with pt needing cues to keep  RLE abducted   Rehab Potential Fair   Clinical Impairments Affecting Rehab Potential Chronicity of condition, continued medical management, but has very strong family and financial support   PT Treatment/Interventions ADLs/Self Care Home Management;DME Instruction;Gait training;Stair training;Functional mobility training;Therapeutic activities;Patient/family education;Cognitive remediation;Neuromuscular re-education;Therapeutic exercise;Balance training;Orthotic Fit/Training;Wheelchair mobility training;Manual techniques;Splinting;Visual/perceptual remediation/compensation;Vestibular;Passive range of motion;Electrical Stimulation   PT Next Visit Plan cont balance and gait   Consulted and Agree with Plan of Care Patient;Family member/caregiver   Family Member Consulted caregiver, [name] and husband [name]      Patient will benefit from skilled therapeutic intervention in order to improve the following deficits and impairments:  Abnormal gait, Decreased balance, Decreased endurance, Decreased cognition, Cardiopulmonary status limiting activity, Decreased activity tolerance, Decreased coordination, Decreased strength, Impaired flexibility, Impaired tone, Decreased mobility, Increased muscle spasms, Impaired vision/preception, Decreased range of motion, Impaired UE functional use, Postural dysfunction, Improper body mechanics, Impaired perceived functional ability, Decreased knowledge of precautions, Decreased knowledge of use of DME  Visit Diagnosis: Muscle weakness (generalized)  Other abnormalities of gait and mobility  Problem List Patient Active Problem List   Diagnosis Date Noted  . Heterotopic ossification of bone 03/10/2016  . Acute on chronic respiratory failure with hypoxia ([place])   . Acute hypoxemic respiratory failure ([place])   . Acute blood loss anemia   . Atelectasis 02/04/2016  . Debility 02/03/2016  . Pneumonia of both lower lobes due to Pseudomonas species ([place])   . Pneumonia  of both lower lobes due to methicillin susceptible Staphylococcus aureus (MSSA) ([place])   . Acute respiratory failure with hypoxia ([place])   . Acute encephalopathy   . Seizures ([place])   . Sepsis ([place])   . Hypoxia   . Pain   . HCAP (healthcare-associated pneumonia) 12/28/2015  . Chronic respiratory failure ([place]) 12/28/2015  . Acute tracheobronchitis 12/24/2015  . Tracheostomy tube present ([place])   . Tracheal stenosis   . Chronic respiratory failure with hypoxia ([place])   . 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[place]) 10/20/2015  . Tracheostomy status ([place]) 09/26/2015  . Allergic rhinitis 09/26/2015  . Viral encephalitis 09/22/2015  . Movement disorder 09/22/2015  . Encephalitis 09/22/2015  . Chest pain 02/07/2014  . Premature atrial contractions 01/13/2014  . Abnormality of gait 12/04/2013  . Hyperlipidemia 12/21/2011  . Cardiovascular risk factor 12/21/2011  . Bunion 01/31/2008  . Metatarsalgia of both feet 09/20/2007  . FLAT FOOT 09/20/2007  . HALLUX RIGIDUS, ACQUIRED 09/20/2007    Alda Lea, PT 07/10/2016, 12:09 PM  [facility] [address] [place], [place], [address] Phone: [phone]   Fax:  [phone]  Name: OAKLYNN STIERWALT MRN: [id number] Date of Birth: [birth year]

## 2016-07-12 ENCOUNTER — Ambulatory Visit: Payer: Medicare Other | Admitting: Physical Therapy

## 2016-07-12 ENCOUNTER — Ambulatory Visit: Payer: Medicare Other | Admitting: Speech Pathology

## 2016-07-12 ENCOUNTER — Ambulatory Visit: Payer: Medicare Other | Admitting: Occupational Therapy

## 2016-07-12 ENCOUNTER — Encounter: Payer: Self-pay | Admitting: Occupational Therapy

## 2016-07-12 DIAGNOSIS — M25622 Stiffness of left elbow, not elsewhere classified: Secondary | ICD-10-CM

## 2016-07-12 DIAGNOSIS — R471 Dysarthria and anarthria: Secondary | ICD-10-CM | POA: Diagnosis not present

## 2016-07-12 DIAGNOSIS — R293 Abnormal posture: Secondary | ICD-10-CM

## 2016-07-12 DIAGNOSIS — M6281 Muscle weakness (generalized): Secondary | ICD-10-CM

## 2016-07-12 DIAGNOSIS — M25612 Stiffness of left shoulder, not elsewhere classified: Secondary | ICD-10-CM

## 2016-07-12 DIAGNOSIS — R29818 Other symptoms and signs involving the nervous system: Secondary | ICD-10-CM

## 2016-07-12 DIAGNOSIS — R41844 Frontal lobe and executive function deficit: Secondary | ICD-10-CM

## 2016-07-12 DIAGNOSIS — R2681 Unsteadiness on feet: Secondary | ICD-10-CM

## 2016-07-12 DIAGNOSIS — R1312 Dysphagia, oropharyngeal phase: Secondary | ICD-10-CM

## 2016-07-12 DIAGNOSIS — R2689 Other abnormalities of gait and mobility: Secondary | ICD-10-CM

## 2016-07-12 DIAGNOSIS — M25611 Stiffness of right shoulder, not elsewhere classified: Secondary | ICD-10-CM

## 2016-07-12 NOTE — Therapy (Signed)
[facility] [address] [place], [place], [address] Phone: [phone]   Fax:  [phone]  Occupational Therapy Treatment  Patient Details  Name: Judy Spears MRN: [id number] Date of Birth: 1950-11-10 Referring Provider: Dr. Quita Skye. [name]  Encounter Date: 07/12/2016      OT End of Session - 07/12/16 1542    Visit Number 35   Number of Visits 43   Date for OT Re-Evaluation 09/07/16   Authorization Type [employer] unlimited visits   OT Start Time 1401   OT Stop Time 1445   OT Time Calculation (min) 44 min   Activity Tolerance Patient tolerated treatment well      Past Medical History:  Diagnosis Date  . Arthritis    lt great toe  . Complication of anesthesia    pt has had headaches post op-did advise to hydra well preop  . Hair loss    right-sided  . History of exercise stress test    a. ETT (12/15) with 12:00 exercise, no ST changes, occasional PACs.   . Hyperlipidemia   . Insomnia   . Migraine headache   . Movement disorder    resltess in left legs  . Postmenopausal   . Premature atrial contractions    a. Holter (12/15) with 8% PACs, no atrial runs or atrial fibrillation.     Past Surgical History:  Procedure Laterality Date  . BREAST BIOPSY  01/01/2011   Procedure: BREAST BIOPSY WITH NEEDLE LOCALIZATION;  Surgeon: Rolm Bookbinder, MD;  Location: [facility];  Service: General;  Laterality: Left;  left breast wire localization biopsy  . BREAST BIOPSY Right 07/27/2012  . cataracts right eye    . CESAREAN SECTION  1986  . HERNIA REPAIR  2000   RIH  . opirectinal membrane peel    . RHINOPLASTY  1983    There were no vitals filed for this visit.      Subjective Assessment - 07/12/16 1452    Subjective  Pt cried and with questioning able to say back hurt.  Unable to rate or descibe.     Patient is accompained by: Family member  husband Judy Spears and caregiver Judy Spears   Pertinent  History see epic snapshot - ABI/encephalitis/hypoxia from virus; recent hospitalization due to ARF;    Patient Stated Goals Pt unable to state.    Currently in Pain? Yes  see note above                      OT Treatments/Exercises (OP) - 07/12/16 0001      ADLs   Functional Mobility Addressed sit to sidelying - pt tolerated well and no evidence of vertigo.  Addressed sit to stand, static standing and functional ambulation (min a x2) - pt demonstrating improved postural alignment and orientation.       Manual Therapy   Manual Therapy Soft tissue mobilization;Myofascial release   Manual therapy comments After gentle stretching to trunk, pt c/o of back pain. Caregiver reports pt has new recliner and that she has been sitting in recliner a great deal past two days. Caregiver reports pt slides down in recliner and then arches back in attempt to stay in chair. Feels this contributed to pt's back pain and pt nodding yes.  Used soft tissue and myofascial release to relax musculature of lower back followed by gentle active stretching in supine.     Soft tissue mobilization Pt very tearful at beginning of session -  husband and caregiver stated pt has been this way for past few days - may be related to recent diagnosis of breast cancer. By end of session pt no longer c/o pain and showing brighter affect.  Will continue to monitor                  OT Short Term Goals - 07/12/16 1516      OT SHORT TERM GOAL #1   Title Pt, husband and cargiver will be mod I with HEP - 03/29/2016   Status Achieved     OT SHORT TERM GOAL #2   Title Pt will be able to sit with supervision without UE support and vc's to assist with midline 75% while engage in functional task   Status Achieved     OT SHORT TERM GOAL #3   Title Pt will be able to complete sit to stand consistently with mod A for toilet transfers (max a for pivot)   Status Achieved     OT SHORT TERM GOAL #4   Title Pt, husband and  caregiver will verbalize understanding of various bathroom accomodations to reduce long term caregiver burden   Status Achieved     OT SHORT TERM GOAL #5   Title Pt will be able to stand step transfer to toilet with mo more than mod a, mod vc's consistently using grab bar for community toileting needs- 06/10/2016 date adjusted as pt missed 2 weeks due to hospitalization   Status Achieved     OT SHORT TERM GOAL #6   Title Pt will be able to wash face with supervision/max cues 50% of the time, min a 50% of the time.    Status Achieved     OT SHORT TERM GOAL #7   Title Pt will be able to don pull over shirt with mod a, max cues   Status Achieved     OT SHORT TERM GOAL #8   Title Pt will be able to maintain static standing with UE support with close supervision to reduce burden of care for caregivers    Status Achieved     OT SHORT TERM GOAL  #9   TITLE Pt and family will be mod I with upgraded home activities program - 08/09/2016   Status New     OT SHORT TERM GOAL  #10   TITLE Pt will be min a stand step toilet transfers at least 50% of the time using either grab bar or walker for community transfers   Status New     OT SHORT TERM GOAL  #11   TITLE Pt will consistently move from sit to stand with no more than min a for functional mobility   Status New     OT SHORT TERM GOAL  #12   TITLE Pt will be no more than mod a consistently for bathing at shower level   Status New           OT Long Term Goals - 07/12/16 1517      OT [place] #1   Title Pt, husband and caregiver will be mod I with upgraded HEP prn - 04/26/2016   Status Achieved     OT LONG TERM GOAL #2   Title Pt, husband and caregiver will be mod I with splint wear and care    Status Achieved     OT LONG TERM GOAL #3   Title Pt will be able to wash face with min a only  Status Achieved     OT LONG TERM GOAL #4   Title Pt will be able to sit without UE support with supevision 100% of the time with no more  than 2 vc's while engaged in functional activity.   Status Achieved     OT LONG TERM GOAL #5   Title Pt, husband and caregiver will verbalize understanding of potential after care therapeutic opportunities. - 07/08/2016 date adjusted as pt missed 2 weeks due to hospitalization   Status Achieved     OT LONG TERM GOAL #6   Title Pt will be supervision to wash face consistently   Status Achieved     OT LONG TERM GOAL #7   Title Pt will don shirt with min a and max vc's 50% of the time   Status Achieved     OT LONG TERM GOAL #8   Title Pt will require min a for stand step transfers to toilet using grab bar or walker for community toileting at least 75% of the time. - 09/06/2016   Status On-going     OT LONG TERM GOAL  #9   Baseline Pt will stand with contact gurard with only 1 UE support for static standing balance in prep for abiilty to engage in functional task while standing.    Status Achieved     OT LONG TERM GOAL  #10   TITLE Pt will consistenly move from sit to stand with no more than CGA while caregiver assists with pulling up pants.    Status New     OT LONG TERM GOAL  #11   TITLE Pt will require no more than close supervision for static standing with bilateral UE while caregiver assists with hiking pants.    Status New     OT LONG TERM GOAL  #12   TITLE Pt will go from stand to sit during ADL activities with no more than min a.    Status New               Plan - 07/12/16 1528    Clinical Impression Statement Pt continues to demonstrate slow but overall steady progress with mobility, functional use of BUE's and participation in basic self care. Will renew pt today with pt and family consent - see goal section for new goals.    Rehab Potential Fair   Current Impairments/barriers affecting progress: severity of deficits, very slow rate of recovery; pt recently diagnosed with breast cancer - will monitor ability to tolerate therapy   OT Treatment/Interventions  Self-care/ADL training;Ultrasound;Moist Heat;Electrical Stimulation;DME and/or AE instruction;Neuromuscular education;Therapeutic exercise;Functional Mobility Training;Manual Therapy;Passive range of motion;Splinting;Therapeutic activities;Balance training;Patient/family education;Visual/perceptual remediation/compensation;Cognitive remediation/compensation   Plan NMR trunk, UE's, functional mobility in context of functional tasks.   Consulted and Agree with Plan of Care Patient;Family member/caregiver   Family Member Consulted caregiver Judy Spears , husband Judy Spears      Patient will benefit from skilled therapeutic intervention in order to improve the following deficits and impairments:  Decreased endurance, Decreased skin integrity, Impaired vision/preception, Improper body mechanics, Impaired perceived functional ability, Decreased knowledge of precautions, Cardiopulmonary status limiting activity, Decreased activity tolerance, Decreased knowledge of use of DME, Decreased strength, Impaired flexibility, Improper spinal/pelvic alignment, Impaired sensation, Difficulty walking, Decreased mobility, Decreased balance, Decreased cognition, Decreased range of motion, Impaired tone, Impaired UE functional use, Decreased safety awareness, Decreased coordination, Pain  Visit Diagnosis: Unsteadiness on feet  Muscle weakness (generalized)  Abnormal posture  Stiffness of right shoulder, not elsewhere classified  Other symptoms  and signs involving the nervous system  Stiffness of left shoulder, not elsewhere classified  Stiffness of left elbow, not elsewhere classified  Frontal lobe and executive function deficit    Problem List Patient Active Problem List   Diagnosis Date Noted  . Heterotopic ossification of bone 03/10/2016  . Acute on chronic respiratory failure with hypoxia ([place])   . Acute hypoxemic respiratory failure ([place])   . Acute blood loss anemia   . Atelectasis 02/04/2016  . Debility  02/03/2016  . Pneumonia of both lower lobes due to Pseudomonas species ([place])   . Pneumonia of both lower lobes due to methicillin susceptible Staphylococcus aureus (MSSA) ([place])   . Acute respiratory failure with hypoxia ([place])   . Acute encephalopathy   . Seizures ([place])   . Sepsis ([place])   . Hypoxia   . Pain   . HCAP (healthcare-associated pneumonia) 12/28/2015  . Chronic respiratory failure ([place]) 12/28/2015  . Acute tracheobronchitis 12/24/2015  . Tracheostomy tube present ([place])   . Tracheal stenosis   . Chronic respiratory failure with hypoxia ([place])   . 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 Tracheostomy status ([place]) 09/26/2015  . Allergic rhinitis 09/26/2015  . Viral encephalitis 09/22/2015  . Movement disorder 09/22/2015  . Encephalitis 09/22/2015  . Chest pain 02/07/2014  . Premature atrial contractions 01/13/2014  . Abnormality of gait 12/04/2013  . Hyperlipidemia 12/21/2011  . Cardiovascular risk factor 12/21/2011  . Bunion 01/31/2008  . Metatarsalgia of both feet 09/20/2007  . FLAT FOOT 09/20/2007  . HALLUX RIGIDUS, ACQUIRED 09/20/2007    Quay Burow, OTR/L 07/12/2016, 3:46 PM  [facility] [address] [place], [place], [address] Phone: [phone]   Fax:  [phone]  Name: Judy Spears MRN: [id number] Date of Birth: 1950/08/22

## 2016-07-13 ENCOUNTER — Ambulatory Visit: Payer: Medicare Other | Admitting: Speech Pathology

## 2016-07-13 ENCOUNTER — Telehealth: Payer: Self-pay | Admitting: *Deleted

## 2016-07-13 DIAGNOSIS — R471 Dysarthria and anarthria: Secondary | ICD-10-CM

## 2016-07-13 DIAGNOSIS — R1312 Dysphagia, oropharyngeal phase: Secondary | ICD-10-CM

## 2016-07-13 DIAGNOSIS — R41841 Cognitive communication deficit: Secondary | ICD-10-CM

## 2016-07-13 NOTE — Therapy (Signed)
[facility] [address] [place], [place], [address] Phone: [phone]   Fax:  [phone]  Speech Language Pathology Treatment  Patient Details  Name: Judy Spears MRN: [id number] Date of Birth: Jun 02, 1950 Referring Provider: Charlett Blake, MD  Encounter Date: 07/12/2016      End of Session - 07/12/16 1535    Visit Number 35   Number of Visits 41   Date for SLP Re-Evaluation 07/30/16   SLP Start Time 4128   SLP Stop Time  1610   SLP Time Calculation (min) 35 min   Activity Tolerance --  Patient limited by decreased sustained attention, emotional state      Past Medical History:  Diagnosis Date  . Arthritis    lt great toe  . Complication of anesthesia    pt has had headaches post op-did advise to hydra well preop  . Hair loss    right-sided  . History of exercise stress test    a. ETT (12/15) with 12:00 exercise, no ST changes, occasional PACs.   . Hyperlipidemia   . Insomnia   . Migraine headache   . Movement disorder    resltess in left legs  . Postmenopausal   . Premature atrial contractions    a. Holter (12/15) with 8% PACs, no atrial runs or atrial fibrillation.     Past Surgical History:  Procedure Laterality Date  . BREAST BIOPSY  01/01/2011   Procedure: BREAST BIOPSY WITH NEEDLE LOCALIZATION;  Surgeon: Rolm Bookbinder, MD;  Location: [facility];  Service: General;  Laterality: Left;  left breast wire localization biopsy  . BREAST BIOPSY Right 07/27/2012  . cataracts right eye    . CESAREAN SECTION  1986  . HERNIA REPAIR  2000   RIH  . opirectinal membrane peel    . RHINOPLASTY  1983    There were no vitals filed for this visit.         07/12/16 1535  Symptoms/Limitations  Subjective "She's been kind of unresponsive the last 2 days," (husband re: patient, who does not make eye contact or acknowledge SLP greeting.  Patient is accompained by: Family member   Special Tests spouse, Judy Spears; caregiver Judy Spears  Pain Assessment  Currently in Pain? Other (Comment) (Pt denies pain currently, complained of low back pain in session with OT immediately prior)  Pain Location Back  Pain Orientation Lower  Pain Descriptors / Indicators Aching  Pain Type Acute pain  Pain Onset Today  Aggravating Factors  chair  Pain Relieving Factors heat, stretching  Multiple Pain Sites No     07/12/16 1535  General Information  Behavior/Cognition Distractible;Requires cueing;Doesn't follow directions;Decreased sustained attention  Patient Positioning Upright in chair  Oral care provided Yes  Treatment Provided  Treatment provided Cognitive-Linquistic  Dysphagia Treatment  Temperature Spikes Noted No  Respiratory Status Room air  Oral Cavity - Dentition Adequate natural dentition  Treatment Methods Skilled observation;Compensation strategy training;Therapeutic exercise  Patient observed directly with PO's Yes  Type of PO's observed Thin liquids  Feeding Needs assist  Liquids provided via Cup  Oral Phase Signs & Symptoms Anterior loss/spillage;Prolonged bolus formation  Pharyngeal Phase Signs & Symptoms Multiple swallows  Type of cueing Verbal;Visual;Tactile  Amount of cueing Maximal  Other treatment/comments SLP began session with intention to target dysphagia, RMT. Pt consumed water with no overt signs of aspiration as session began, however SLP ceased dysphagia treatment as pt with poor attention to PO.  Cognitive-Linquistic Treatment  Treatment focused on Voice;Dysarthria  Skilled Treatment Today's session was limited due to pt's impaired focused/sustained attention. Pt made no efforts to communicate verbally despite max cues, did not make eye contact and was unable to follow commands to demo RMT exercises. Pt became tearful when SLP asked if she was okay. SLP ceased dysphagia treatment to facilitate verbal expression of her emotional state. Pt unable to verbalize  her feelings; suspect may be related to her recent breast cancer diagnosis. With mod-max cues, pt did respond to Y/N questions via head nod.   Tracheostomy Shiley 4 mm Uncuffed  Placement Date/Time: 05/08/16 1030   Placed By: ICU physician  Brand: Shiley  Size (mm): 4 mm  Style: Uncuffed  Status Capped  Site Assessment Clean;Dry  Ties Assessment Clean;Dry;Secure  Assessment / Recommendations / Plan  Plan Continue with current plan of care  Dysphagia Recommendations  Diet recommendations Dysphagia 1 (puree);Thin liquid  Liquids provided via Teaspoon;Cup  Medication Administration Via alternative means  Supervision Full supervision/cueing for compensatory strategies  Compensations Effortful swallow;Multiple dry swallows after each bite/sip;Check for anterior loss;Small sips/bites;Slow rate;Check for pocketing  Postural Changes and/or Swallow Maneuvers Seated upright 90 degrees  Progression Toward Goals  Progression toward goals Progressing toward goals  Patient/Family Stated Goal Eat and drink by mouth, improve cough, build breath support for increased vocal amplitude         SLP Education - 07/12/16 1535    Education provided Yes   Education Details Monitoring for signs of aspiration, pneumonia. Increase level of assistance, cuing for attention during PO, cease feeding if pt unable to sustain attention to intake   Methods Explanation;Demonstration   Comprehension Need further instruction;Verbalized understanding          SLP Short Term Goals - 07/12/16 1535      SLP SHORT TERM GOAL #1   Title Patient will utilize intelligibility strategies with moderate visual cues from communication partner/visual aid to communicate 5-7 word wants, needs, thoughts and ideas.   Status Achieved     SLP SHORT TERM GOAL #2   Title Patient will utilize intelligibility strategies, breath support with mod A for phrase repetition in 75% of trials.   Status Achieved     SLP SHORT TERM GOAL #3    Title Pt will swallow thin liquids, purees and soft solids in 9/10 trials with no overt signs of aspiration.   Status Achieved     SLP SHORT TERM GOAL #4   Title Patient will tolerate 6oz water, >95% with no overt signs of aspiration.   Status Achieved     SLP SHORT TERM GOAL #5   Title Patient will utilize verbal cues for lip closure, dry swallow for secretion management in 75% of opportunities.   Status Achieved          SLP Long Term Goals - 07/12/16 1535      SLP LONG TERM GOAL #1   Title Patient will utilize multimodal communication/AAC to clarify wants, needs, thoughts or ideas during communication breakdowns with max A   Status Deferred     SLP LONG TERM GOAL #2   Title Patient will utilize intelligibility strategies, breath support with min A visual cues for 5-7 word responses with 80% accuracy.   Baseline 06/17/16   Time 2   Period Weeks   Status On-going     SLP LONG TERM GOAL #3   Title Patient will demonstrate readiness for MBS to determine safety for pleasure feeds by timely initiation of swallow  in 3/5 trials with ice chips/H20.   Status Achieved     SLP LONG TERM GOAL #4   Title Pt will consume pleasure feeds of desired consistency (soft solids and thin liquids) with signs of aspiration in fewer than 10% of trials with caregiver providing moderate cues and monitoring for overt signs of aspiration.   Time 2   Period Weeks   Status On-going     SLP LONG TERM GOAL #5   Title Patient will utilize visual cue for lip closure, dry swallow for secretion management in 75% of opportunities.   Status Achieved     SLP LONG TERM GOAL #6   Title Pt will demo HEP for RMT, dysphagia with mod caregiver assistance.   Time 2   Period Weeks   Status On-going          Plan - 07/12/16 1535    Clinical Impression Statement Treatment limited today by pt's reduced attention, emotional state. Educated family re: swallowing precautions and monitoring closely for s/sx of  aspiration and pneumonia, potential decline in swallowing function when pt is "feeling off."  Pt will benefit from continued skilled ST to address underlying physical impairments impacting swallowing and speech, as well as education and training in monitoring for and reducing risks of aspiration.   Duration 4 weeks   Treatment/Interventions Aspiration precaution training;Trials of upgraded texture/liquids;Compensatory strategies;Functional tasks;Patient/family education;Cueing hierarchy;Diet toleration management by SLP;Cognitive reorganization;Compensatory techniques;SLP instruction and feedback;Internal/external aids;Multimodal communcation approach;Pharyngeal strengthening exercises   Potential to Achieve Goals Fair   Potential Considerations Ability to learn/carryover information;Co-morbidities;Severity of impairments   SLP Home Exercise Plan RMT   Consulted and Agree with Plan of Care Family member/caregiver;Patient   Family Member Consulted husband Judy Spears, caregiver Judy Spears      Patient will benefit from skilled therapeutic intervention in order to improve the following deficits and impairments:   Dysarthria and anarthria  Dysphagia, oropharyngeal phase    Problem List Patient Active Problem List   Diagnosis Date Noted  . Heterotopic ossification of bone 03/10/2016  . Acute on chronic respiratory failure with hypoxia ([place])   . Acute hypoxemic respiratory failure ([place])   . Acute blood loss anemia   . Atelectasis 02/04/2016  . Debility 02/03/2016  . Pneumonia of both lower lobes due to Pseudomonas species ([place])   . Pneumonia of both lower lobes due to methicillin susceptible Staphylococcus aureus (MSSA) ([place])   . Acute respiratory failure with hypoxia ([place])   . Acute encephalopathy   . Seizures ([place])   . Sepsis ([place])   . Hypoxia   . Pain   . HCAP (healthcare-associated pneumonia) 12/28/2015  . Chronic respiratory failure ([place]) 12/28/2015  . Acute tracheobronchitis 12/24/2015  .  Tracheostomy tube present ([place])   . Tracheal stenosis   . Chronic respiratory failure with hypoxia ([facility])   . 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 Tracheostomy status ([place]) 09/26/2015  . Allergic rhinitis 09/26/2015  . Viral encephalitis 09/22/2015  . Movement disorder 09/22/2015  . Encephalitis 09/22/2015  . Chest pain 02/07/2014  . Premature atrial contractions 01/13/2014  . Abnormality of gait 12/04/2013  . Hyperlipidemia 12/21/2011  . Cardiovascular risk factor 12/21/2011  . Bunion 01/31/2008  . Metatarsalgia of both feet 09/20/2007  . FLAT FOOT 09/20/2007  . HALLUX RIGIDUS, ACQUIRED 09/20/2007   Deneise Lever, [place], [place] Speech-Language Pathologist  Aliene Altes 07/13/2016, 1:57 PM  [facility] [address] [place], [place], [address] Phone: [phone]   Fax:  [place]   Name: Judy Spears MRN: [id number] Date of Birth: 10-22-1950

## 2016-07-13 NOTE — Progress Notes (Signed)
Location of Breast Cancer: left breast upper inner quadrant  Histology per Pathology Report:  07/07/16 Diagnosis Breast, right, needle core biopsy, upper inner - FIBROCYSTIC CHANGE AND USUAL DUCTAL HYPERPLASIA WITH CALCIFICATIONS. - FOCAL FAT NECROSIS. - NO MALIGNANCY IDENTIFIED.  06/30/16 Diagnosis Breast, left, needle core biopsy, 11:00 o'clock INVASIVE DUCTAL CARCINOMA, GRADE 2 DUCTAL CARCINOMA IN SITU  Receptor Status: ER(100%), PR (5%), Her2-neu (neg), Ki-(12%)  Did patient present with symptoms (if so, please note symptoms) or was this found on screening mammography?: patient's caregiver noticed a left breast mass.  Past/Anticipated interventions by surgeon, if any: mastectomy vs lumpectomy - tentatively scheduled for lumpectomy on 08/03/16 at [facility]  Past/Anticipated interventions by medical oncology, if any: apt with Dr. Jana Hakim on 07/14/16  Lymphedema issues, if any: no   Pain issues, if any:  no    OB Gyn history: she was 80 with birth of first child. Gravida 4, para 3. She has used BCP "a long time ago."  SAFETY ISSUES:  Prior radiation? no  Pacemaker/ICD? no  Possible current pregnancy?no  Is the patient on methotrexate? no  Current Complaints / other details:  Patient is here with her husband and caregiver.  They would like a consensus agreement on what treatment is recommended.  BP 132/86 (BP Location: Left Arm, Patient Position: Sitting)   Pulse 71   Temp 97.8 F (36.6 C) (Oral)   Ht '5\' 4"'$  (1.626 m)   Wt 102 lb 9.6 oz (46.5 kg)   LMP 12/01/2010   SpO2 100%   BMI 17.61 kg/m    Wt Readings from Last 3 Encounters:  07/14/16 102 lb 9.6 oz (46.5 kg)  05/11/16 105 lb 9.6 oz (47.9 kg)  02/25/16 117 lb 4.6 oz (53.2 kg)      Jacqulyn Liner, RN 07/13/2016,6:29 PM

## 2016-07-13 NOTE — Telephone Encounter (Signed)
Judy Spears called and left a message that Judy Spears was having episodes of staring off into space and not able to talk like she has been.  She was not running a fever.  The message was delayed in reaching staff and I called him back today and apologized for the delay.  He says that she is better now and is not having the issue. He does say that she is wanting to sleep all the time and it is hard to arouse her and then she has the episodes.  I reinforced to him when ever he is concerned with a change in her status- if it is concerning enough that he feels she needs to be seen, he will need to always take her to an urgent care or ED to be evaluated.  Call us by all means but our office does not operate like a primary care or urgent care where we have appts set aside for emergencies or walk ins.  He understands.  I questioned if she has had seizures as it sounded like he was describing a petite mal seizure but he said it was not that because it lasts sometimes a day and a half long. Since she is better today I told him I will inform Dr [name] but if it happens again he must take her to be seen in urgent care or ED if they have no primary.  He does say that Dr [name] ordered 6 mg melatonin and he could not find that dose so he got 10 mg and had given her that dose. He is not sure if it may be causing the issue. He says they held it and have started back giving her half (5 mg).  I told him it usually is found in 3 mg tabs and he could give her two.  I will pass along to Dr [name].

## 2016-07-13 NOTE — Therapy (Signed)
[facility] [address] [place], [place], [address] Phone: [phone]   Fax:  [phone]  Physical Therapy Treatment  Patient Details  Name: Judy Spears MRN: [id number] Date of Birth: [birth year] Referring Provider: Alger Simons, MD/Andrew Letta Pate, MD  Encounter Date: 07/12/2016      PT End of Session - 07/13/16 1409    Visit Number [id number]   Number of Visits 80   Date for PT Re-Evaluation 08/23/16   Authorization Type [employer]   PT Start Time 0350   PT Stop Time 1531   PT Time Calculation (min) 44 min      Past Medical History:  Diagnosis Date  . Arthritis    lt great toe  . Complication of anesthesia    pt has had headaches post op-did advise to hydra well preop  . Hair loss    right-sided  . History of exercise stress test    a. ETT (12/15) with 12:00 exercise, no ST changes, occasional PACs.   . Hyperlipidemia   . Insomnia   . Migraine headache   . Movement disorder    resltess in left legs  . Postmenopausal   . Premature atrial contractions    a. Holter (12/15) with 8% PACs, no atrial runs or atrial fibrillation.     Past Surgical History:  Procedure Laterality Date  . BREAST BIOPSY  01/01/2011   Procedure: BREAST BIOPSY WITH NEEDLE LOCALIZATION;  Surgeon: Rolm Bookbinder, MD;  Location: [facility];  Service: General;  Laterality: Left;  left breast wire localization biopsy  . BREAST BIOPSY Right 07/27/2012  . cataracts right eye    . CESAREAN SECTION  1986  . HERNIA REPAIR  2000   RIH  . opirectinal membrane peel    . RHINOPLASTY  1983    There were no vitals filed for this visit.      Subjective Assessment - 07/13/16 1248    Subjective Pt's husband, [name], reports that pt was very sleepy yesterday (Sunday) and somewhat "out of it";  pt had OT session prior to PT and OT reported that pt cried with stretching (due to back pain??)   Patient is accompained by: Family member    Pertinent History Precautions:  PEG.  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  Another round of botox in LLE on 02/06/16   Patient Stated Goals Per husband, "For [place] to be as independent as possible."   Currently in Pain? Yes   Pain Score --  unable to rate due to pt beng nonverbal   Pain Location Back   Pain Orientation Lower   Pain Descriptors / Indicators Grimacing   Pain Type Acute pain   Pain Onset Today   Pain Frequency Constant                         [facility] Adult PT Treatment/Exercise - 07/13/16 0001      Bed Mobility   Left Sidelying to Sit 3: Mod assist   Supine to Sit 3: Mod assist     Transfers   Transfers Sit to Stand   Sit to Stand 4: Min assist;3: Mod assist   Sit to Stand Details Tactile cues for placement;Verbal cues for technique;Manual facilitation for weight shifting;Verbal cues for gait pattern   Sit to  Stand Details (indicate cue type and reason) cues to lean anteriorly   Stand to Sit 3: Mod assist   Stand to Sit Details (indicate cue type and reason) Verbal cues for technique;Tactile cues for placement   Stand to Sit Details cues to flex hips and lower for controlled descent     Ambulation/Gait   Ambulation/Gait Yes   Ambulation/Gait Assistance 3: Mod assist  +2 hand held assist; cues on rib cage for weight shift    Ambulation/Gait Assistance Details tactile cues to weight shift and trunk elongation:  verbal cues to keep "belly forward" and maintain upright posture: also cues to lift Rt foot as pt was dragging Rt toes during gait training   Ambulation Distance (Feet) 115 Feet   Assistive device 2 person hand held assist   Gait Pattern Decreased stance time - right;Decreased hip/knee flexion - right;Decreased hip/knee flexion - left;Decreased weight shift  to right;Scissoring;Ataxic   Ambulation Surface Level;Indoor     Static Standing Balance   Static Standing - Balance Support No upper extremity supported   Static Standing - Level of Assistance 4: Min assist   Static Standing - Comment/# of Minutes pt performed weight shifting laterally - 5 reps with min assist; pt then performed lateral stepping with RLE and LLE 5 reps each with mod assist for balance during stepping     Atttempted gait with bil. Platform RW with 3" (only one with bil. Platforms available and did not change due to time constraint) Pt had much difficulty advancing this RW and more difficulty clearing Rt foot due to Rt toes dragging today              PT Short Term Goals - 06/25/16 1205      PT SHORT TERM GOAL #1   Title Jay Back on pt's current manual wheelchair to be switched out to regular back; pt will demonstrate abilty to sit in wheelchair with upright with less supportive wheelchair back.  07-24-16   Baseline Jay Back on current wheelchair   Time 4   Period Weeks   Status New     PT SHORT TERM GOAL #2   Title Pt will perform sit to stand from wheelchair to RW with CGA.  07-24-16   Baseline min to mod assist needed with cues for positioning of feet and to lean forward - 06-24-16   Time 4   Period Weeks   Status New     PT SHORT TERM GOAL #3   Title Family to obtain bil. platform RW to begin assisting pt with ambulation in her home environment.  07-24-16   Baseline have been assessing ability to use bil. platform RW vs. UP walker   Time 4   Period Weeks   Status New     PT SHORT TERM GOAL #4   Title Family will report pt rolling from supine to Rt and Lt sides in hospital bed at home with supervision without use of bed rail with cues for positioning of LE's.  07-24-16   Baseline pt needs min assist at times - activity just demonstrated by pt on 06-24-16   Time 4   Period Weeks   Status New     PT SHORT TERM GOAL #5   Title Basic transfers from  wheelchair to bed or commode with CGA.  07-24-16   Baseline min to mod assist needed - 06-24-16   Time 4   Period Weeks     PT SHORT TERM GOAL #6  Title Amb. 115' with bil. platform RW with CGA to begin amb. at home with assistance safely.  07-24-16   Baseline 115' with platform RW with min to mod assist   Time 4   Period Weeks   Status New           PT Long Term Goals - 06/25/16 1142      PT LONG TERM GOAL #1   Title Pt will perform supine rolling R and L at mod I level in order to indicate improved independence with bed mobility.  (Target Date: 06/25/16)/ 08-23-16  CONT. LTG til 08-23-16   Baseline inconsistent in performance - pt needs cues for LE flexion to assist with pushing for rolling and cues for UE positioning   Time 8   Period Weeks   Status On-going     PT LONG TERM GOAL #2   Title Pt will perform sit>SL R or L modified independently in order to indicate improved independence with getting into bed.   08-23-16   Baseline min to mod assist needed - 06-24-16   Time 8   Status New     PT LONG TERM GOAL #3   Title Pt will transfer Lt/Rt sidelying to sitting with CGA for incr. independence with getting out of bed.  08-23-16   Baseline min to mod assist needed -06-24-16   Time 8   Period Weeks   Status New     PT LONG TERM GOAL #4   Title Pt will perform basic transfers with CGA to indicate improved functional independence.  08-23-16   Baseline min assist needed - 06-24-16   Time 8   Period Weeks   Status New     PT LONG TERM GOAL #5   Title Pt will perform static standing with bil. UE support with distant supervision for >/= 10" for improved standing balance.  08-23-16   Baseline close SBA to CGA needed - pt standing with bil UE support for 8-10" per caregiver's report -- 06-24-16   Time 8   Period Weeks   Status New     Additional Long Term Goals   Additional Long Term Goals Yes     PT LONG TERM GOAL #6   Title Pt will demonstrate ability to ambulate up to 25' w/  LRAD at min A level in order to assist with getting around home.  CONT LTG til 08-23-16; goal upgraded to 50'   Baseline bil. platform RW to be used in home   Time 8   Period Weeks   Status On-going     PT LONG TERM GOAL #7   Title Perform sit to stand and stand to sit to RW or to another object with CGA.  08-23-16   Baseline min assist needed - 06-24-16   Time 8   Period Weeks   Status New               Plan - 07/13/16 1411    Clinical Impression Statement Pt had significant more difficulty with amb. today compared to pt's performance in previous sessions; pt expressing back pain/discomfort but difficult to assess due to lack of coomunication/verbalization.  Attempted to use platorm RW with 3" wheels (due to only one available with bil. platforms) but pt unable to push walker as she has been able to push the RW with 5" wheels;  pt had much more difficulty with Rt foot clearance due to Rt foot drop during today's session   Rehab Potential Fair  Clinical Impairments Affecting Rehab Potential Chronicity of condition, continued medical management, but has very strong family and financial support   PT Frequency 2x / week   PT Duration 8 weeks   PT Treatment/Interventions ADLs/Self Care Home Management;DME Instruction;Gait training;Stair training;Functional mobility training;Therapeutic activities;Patient/family education;Cognitive remediation;Neuromuscular re-education;Therapeutic exercise;Balance training;Orthotic Fit/Training;Wheelchair mobility training;Manual techniques;Splinting;Visual/perceptual remediation/compensation;Vestibular;Passive range of motion;Electrical Stimulation   PT Next Visit Plan cont gait, balance, transfer, bed mobility training; try technique for upright posture in standing (if pt able to tolerate); STG's due to be assessed by 07-24-16   Consulted and Agree with Plan of Care Patient;Family member/caregiver   Family Member Consulted caregiver, [name] and husband [name]       Patient will benefit from skilled therapeutic intervention in order to improve the following deficits and impairments:  Abnormal gait, Decreased balance, Decreased endurance, Decreased cognition, Cardiopulmonary status limiting activity, Decreased activity tolerance, Decreased coordination, Decreased strength, Impaired flexibility, Impaired tone, Decreased mobility, Increased muscle spasms, Impaired vision/preception, Decreased range of motion, Impaired UE functional use, Postural dysfunction, Improper body mechanics, Impaired perceived functional ability, Decreased knowledge of precautions, Decreased knowledge of use of DME  Visit Diagnosis: Other abnormalities of gait and mobility  Muscle weakness (generalized)     Problem List Patient Active Problem List   Diagnosis Date Noted  . Heterotopic ossification of bone 03/10/2016  . Acute on chronic respiratory failure with hypoxia ([place])   . Acute hypoxemic respiratory failure ([place])   . Acute blood loss anemia   . Atelectasis 02/04/2016  . Debility 02/03/2016  . Pneumonia of both lower lobes due to Pseudomonas species ([place])   . Pneumonia of both lower lobes due to methicillin susceptible Staphylococcus aureus (MSSA) ([place])   . Acute respiratory failure with hypoxia ([place])   . Acute encephalopathy   . Seizures ([place])   . Sepsis ([place])   . Hypoxia   . Pain   . HCAP (healthcare-associated pneumonia) 12/28/2015  . Chronic respiratory failure ([place]) 12/28/2015  . Acute tracheobronchitis 12/24/2015  . Tracheostomy tube present ([place])   . Tracheal stenosis   . Chronic respiratory failure with hypoxia ([place])   . 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 Tracheostomy status ([place]) 09/26/2015  . Allergic rhinitis 09/26/2015  . Viral encephalitis 09/22/2015  . Movement disorder 09/22/2015  . Encephalitis 09/22/2015  . Chest pain 02/07/2014  . Premature atrial contractions 01/13/2014  . Abnormality of gait 12/04/2013  . Hyperlipidemia  12/21/2011  . Cardiovascular risk factor 12/21/2011  . Bunion 01/31/2008  . Metatarsalgia of both feet 09/20/2007  . FLAT FOOT 09/20/2007  . HALLUX RIGIDUS, ACQUIRED 09/20/2007    Alda Lea, PT 07/13/2016, 2:18 PM  [facility] [address] [place], [place], [address] Phone: [phone]   Fax:  [phone]  Name: Judy Spears MRN: [id number] Date of Birth: [birth year]

## 2016-07-14 ENCOUNTER — Ambulatory Visit
Admission: RE | Admit: 2016-07-14 | Discharge: 2016-07-14 | Disposition: A | Discharge status: Home / Self Care | Payer: Medicare Other | Source: Ambulatory Visit | Attending: Radiation Oncology | Admitting: Radiation Oncology

## 2016-07-14 ENCOUNTER — Encounter: Payer: Self-pay | Admitting: Radiation Oncology

## 2016-07-14 ENCOUNTER — Ambulatory Visit (HOSPITAL_BASED_OUTPATIENT_CLINIC_OR_DEPARTMENT_OTHER): Payer: Medicare Other | Admitting: Oncology

## 2016-07-14 DIAGNOSIS — C50912 Malignant neoplasm of unspecified site of left female breast: Secondary | ICD-10-CM | POA: Insufficient documentation

## 2016-07-14 DIAGNOSIS — Z170 Estrogen receptor positive status [ER+]: Secondary | ICD-10-CM

## 2016-07-14 DIAGNOSIS — A849 Tick-borne viral encephalitis, unspecified: Secondary | ICD-10-CM

## 2016-07-14 DIAGNOSIS — C50212 Malignant neoplasm of upper-inner quadrant of left female breast: Secondary | ICD-10-CM

## 2016-07-14 HISTORY — DX: Powassan virus disease: A84.81

## 2016-07-14 HISTORY — DX: Other tick-borne viral encephalitis: A84.8

## 2016-07-14 NOTE — Progress Notes (Signed)
[place]  Telephone:(336) [phone] Fax:(336) [phone]     ID: Judy Spears DOB: 08-25-1950  MR#: [id number]  QIH#:[id number]  Patient Care Team: Cari Caraway, MD as PCP - General (Family Medicine) Halley Shepheard, Virgie Dad, MD as Consulting Physician (Oncology) Rolm Bookbinder, MD as Consulting Physician (General Surgery) Gery Pray, MD as Consulting Physician (Radiation Oncology) Venetia Night, MD (Hematology and Oncology) Andi Devon, MD as Referring Physician (General Surgery) Chauncey Cruel, MD OTHER MD:  CHIEF COMPLAINT: Estrogen receptor positive breast cancer  CURRENT TREATMENT: Anastrozole; definitive surgery pending   BREAST CANCER HISTORY: Judy Spears underwent excision of an area of atypical lobular hyperplasia in the left breast in 2012. She then had some possibly abnormal calcifications near the right nipple 3:00 position in 2014. Biopsy of this showed only benign fibrocystic changes.  More recently her caregiver Judy Spears noted a left breast mass and Dr. Luna Fuse set the patient up for bilateral diagnostic mammography and left breast ultrasonography at the [facility] 06/30/2016. The breast density was category C. In the upper inner quadrant of the left breast there was a 1.7 cm group of amorphous calcifications. On exam there was an easily palpable mass in that area. By ultrasound a 1.5 cm mass was found at the 11:00 radiant 5 cm from the nipple. Ultrasound of the axilla was difficult but no abnormal lymph nodes were identified.  On 06/30/2016 she underwent biopsy of the left breast mass in question and this showed ([id number]) invasive ductal carcinoma, grade 2, estrogen receptor 100% positive, progesterone receptor 5% positive, both with strong staining intensity, with an MIB-1 of 12%, and no HER-2 amplification, the signals ratio being 0.72 and the number per cell 1.05.  An area of calcifications was noted in the right breast at the same time. This was  biopsied 03/09/2016 also and proved to be fat necrosis ([id number]).  Her subsequent history is as detailed below   INTERVAL HISTORY: Judy Spears was evaluated in the breast clinic 07/14/2016 accompanied by her husband Judy and her caregiver Judy Spears. Her case was also presented in the multidisciplinary breast cancer conference 07/07/2016. At that time a preliminary plan was proposed: Consideration of mastectomy as external beam radiation might prove difficult, no sentinel lymph node sampling, treatment with anastrozole (no chemotherapy).  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 She is still wheelchair-bound during the day but having active rehabilitation to try to regain mobility. She has not regain speech, but certainly understands what is being said. At present she has no symptoms related to her breast cancer that she or the family are aware of.  PAST MEDICAL HISTORY: Past Medical History:  Diagnosis Date  . Arthritis    lt great toe  . Complication of anesthesia    pt has had headaches post op-did advise to hydra well preop  . Hair loss    right-sided  . History of exercise stress test    a. ETT (12/15) with 12:00 exercise, no ST changes, occasional PACs.   . Hyperlipidemia   . Insomnia   . Migraine headache   . Movement disorder    resltess in left legs  . Postmenopausal   . 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 Premature atrial contractions    a. Holter (12/15) with 8% PACs, no atrial runs or atrial fibrillation.     PAST SURGICAL HISTORY: Past Surgical History:  Procedure Laterality Date  .  BREAST BIOPSY  01/01/2011   Procedure: BREAST BIOPSY WITH NEEDLE LOCALIZATION;  Surgeon: Rolm Bookbinder, MD;  Location: [facility];  Service: General;  Laterality: Left;  left breast wire localization biopsy  . BREAST BIOPSY Right 07/27/2012  . BREAST BIOPSY Right 07/07/2016    . BREAST BIOPSY Left 06/30/2016  . cataracts right eye    . CESAREAN SECTION  1986  . HERNIA REPAIR  2000   RIH  . opirectinal membrane peel    . PEG PLACEMENT    . RHINOPLASTY  1983  . TRACHEOSTOMY      FAMILY HISTORY Family History  Problem Relation Age of Onset  . Stroke Mother        paralyzed on right side/aphasia  . Hypertension Mother        had stroke after stopping BP meds  . Colon cancer Father   . Prostate cancer Father   . Heart attack Maternal Grandfather 56  . Stroke Maternal Uncle 34  The patient's father is living, age 66. The patient's mother died after a stroke age 59. The patient had 2 brothers, no sisters. There is no history of breast or ovarian cancer in the family.  GYNECOLOGIC HISTORY:  Patient's last menstrual period was 12/01/2010. Menarche age not available; first live birth age 64; the patient is [place] P3. She is postmenopausal. She may have taken hormone replacement briefly.  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 Her husband Judy Spears is a Chief Executive Officer here in town. Their daughter Judy Spears lives in [place]. She is a Programmer, multimedia and both she and her husband work for [employer]. Judy Spears (M.D. PhD student at [employer] and also an Therapist, sports. Daughter Judy Spears in [place] helps run the sore heating company started by her brother. The patient has 5 grandchildren.     ADVANCED DIRECTIVES:    HEALTH MAINTENANCE: Social History  Substance Use Topics  . Smoking status: Never Smoker  . Smokeless tobacco: Never Used  . Alcohol use 0.0 oz/week     Comment: Occasional.     Colonoscopy: Due  PAP:  Bone density:   Allergies  Allergen Reactions  . Cefepime Other (See Comments)    Status epilepticus  . Tetracyclines & Related Photosensitivity and Other (See Comments)    Blisters  . Pollen Extract Other (See Comments)    Running nose    Current Outpatient Prescriptions  Medication Sig Dispense Refill  . amantadine (SYMMETREL) 50  MG/5ML solution Place 15 mLs (150 mg total) into feeding tube 2 (two) times daily. In am and at noon 473 mL 0  . amantadine (SYMMETREL) 50 MG/5ML solution PLACE 22m INTO FEEDING TUBE TWICE DAILY 900 mL 0  . amLODipine (NORVASC) 5 MG tablet PLACE 1 TABLET INTO FEEDING TUBE DAILY 30 tablet 2  . anastrozole (ARIMIDEX) 1 MG tablet Take 1 tablet (1 mg total) by mouth daily. 90 tablet 4  . atorvastatin (LIPITOR) 20 MG tablet Place 20 mg into feeding tube at bedtime.     . baclofen (LIORESAL) 10 MG tablet Place 1 tablet (10 mg total) into feeding tube every 6 (six) hours. 120 each 5  . chlorhexidine (PERIDEX) 0.12 % solution 148mIN THE MOUTH OR THROAT TWICE DAILY 120 mL 6  . chlorhexidine gluconate, MEDLINE KIT, (PERIDEX) 0.12 % solution 15 mLs by Mouth Rinse route 2 (two) times daily. (Patient taking differently: 5 mLs by Mouth Rinse route 2 (two) times daily. ) 120 mL 0  . Coenzyme Q10 (COQ10  PO) Take 10 mLs by mouth at bedtime.    [name] CRANBERRY JUICE EXTRACT PO Take by mouth.    . diazepam (VALIUM) 2 MG tablet PLACE 1 TABLET INTO FEEDING TUBE AT BEDTIME 30 tablet 1  . docusate (COLACE) 60 MG/15ML syrup Place 100 mg into feeding tube daily. Tales once daily, but can take 1 addt'l dose as needed for constipation    . famotidine (PEPCID) 20 MG tablet Take 1 tablet (20 mg total) by mouth 2 (two) times daily. 60 tablet 3  . fluticasone (FLONASE) 50 MCG/ACT nasal spray Place 1 spray into both nostrils 2 (two) times daily.    [name] guaiFENesin (ROBITUSSIN) 100 MG/5ML SOLN Place 10 mLs (200 mg total) into feeding tube 4 (four) times daily. 1200 mL 0  . Hydrocortisone (GERHARDT'S BUTT CREAM) CREA Apply 1 application topically 4 (four) times daily. 1 each 0  . loratadine (CLARITIN) 10 MG tablet Place 1 tablet (10 mg total) into feeding tube daily as needed for allergies. (Patient taking differently: Place 10 mg into feeding tube every morning. )    . Melatonin 3 MG TABS Place 6 mg into feeding tube every evening.     . Multiple Vitamin (MULTIVITAMIN) LIQD Place 15 mLs into feeding tube daily. 1 Bottle 3  . nystatin (MYCOSTATIN/NYSTOP) powder Apply 1 g topically 2 (two) times daily as needed. (Patient taking differently: Apply 1 g topically 2 (two) times daily as needed (irritation). ) 45 g 0  . potassium chloride 20 MEQ/15ML (10%) SOLN Place 15 mLs (20 mEq total) into feeding tube daily. 900 mL 5  . Probiotic Product (PROBIOTIC ADVANCED) CAPS Take 1 capsule by mouth daily.    . prochlorperazine (COMPAZINE) 5 MG tablet Place 1-2 tablets (5-10 mg total) into feeding tube every 6 (six) hours as needed for nausea. 30 tablet 0  . simethicone (MYLICON) 40 WE/9.9BZ drops Place 0.6 mLs (40 mg total) into feeding tube 4 (four) times daily as needed for flatulence. 45 mL 0  . valACYclovir (VALTREX) 500 MG tablet PLACE 1 TABLET INTO FEEDING TUBE DAILY 30 tablet 1  . Water For Irrigation, Sterile (FREE WATER) SOLN Place 150 mLs into feeding tube See admin instructions. Takes 15m 5 times daily, if over 2075mhold water and recheck within an hour     No current facility-administered medications for this visit.     OBJECTIVE: Middle-aged white woman examined in a wheelchair  There were no vitals filed for this visit.   There is no height or weight on file to calculate BMI.   Temperature 90.8 percent, pulse rate 66, respiratory rate 18. Blood pressure was 122/74.    ECOG FS:2 - Symptomatic, <50% confined to bed  Ocular: Sclerae unicteric, pupils round and equal Ear-nose-throat: Tracheostomy in place Lymphatic: No cervical or supraclavicular adenopathy Lungs no rales or rhonchi Heart regular rate and rhythm Abd soft, nontender, positive bowel sounds Neuro: Wheelchair bound, with some spasticity, nonverbal, follows commands,close attention to the discussion and appears to understand everything that is said; mood is positive Breasts: The right breast is status post recent benign biopsy. The left breast shows an easily  palpable mass superiorly, easily movable, with no skin or nipple changes of concern. There was no palpable adenopathy   LAB RESULTS:  CMP     Component Value Date/Time   NA 135 05/10/2016 0206   K 3.2 (L) 05/10/2016 0206   CL 97 (L) 05/10/2016 0206   CO2 28 05/10/2016 0206   GLUCOSE 146 (H) 05/10/2016  0206   BUN 10 05/10/2016 0206   CREATININE <0.30 (L) 05/10/2016 0206   CALCIUM 9.3 05/10/2016 0206   PROT 6.5 05/06/2016 1800   ALBUMIN 3.7 05/06/2016 1800   AST 36 05/06/2016 1800   ALT 80 (H) 05/06/2016 1800   ALKPHOS 101 05/06/2016 1800   BILITOT 2.0 (H) 05/06/2016 1800   GFRNONAA NOT CALCULATED 05/10/2016 0206   GFRAA NOT CALCULATED 05/10/2016 0206    No results found for: TOTALPROTELP, ALBUMINELP, A1GS, A2GS, BETS, BETA2SER, GAMS, MSPIKE, SPEI  No results found for: Nils Pyle, [facility]  Lab Results  Component Value Date   WBC 7.4 05/10/2016   NEUTROABS 4.2 05/10/2016   HGB 13.3 05/10/2016   HCT 39.9 05/10/2016   MCV 88.7 05/10/2016   PLT 312 05/10/2016      Chemistry      Component Value Date/Time   NA 135 05/10/2016 0206   K 3.2 (L) 05/10/2016 0206   CL 97 (L) 05/10/2016 0206   CO2 28 05/10/2016 0206   BUN 10 05/10/2016 0206   CREATININE <0.30 (L) 05/10/2016 0206      Component Value Date/Time   CALCIUM 9.3 05/10/2016 0206   ALKPHOS 101 05/06/2016 1800   AST 36 05/06/2016 1800   ALT 80 (H) 05/06/2016 1800   BILITOT 2.0 (H) 05/06/2016 1800       No results found for: LABCA2  No components found for: [id number]  No results for input(s): INR in the last 168 hours.  Urinalysis    Component Value Date/Time   COLORURINE YELLOW 12/28/2015 [place] 12/28/2015 1205   LABSPEC 1.025 12/28/2015 1205   PHURINE 7.0 12/28/2015 1205   GLUCOSEU 500 (A) 12/28/2015 1205   HGBUR SMALL (A) 12/28/2015 1205   BILIRUBINUR NEGATIVE 12/28/2015 1205   KETONESUR NEGATIVE 12/28/2015 1205   PROTEINUR 100 (A) 12/28/2015 1205    NITRITE NEGATIVE 12/28/2015 1205   LEUKOCYTESUR NEGATIVE 12/28/2015 1205     STUDIES: Dg Chest 1 View  Result Date: 06/22/2016 CLINICAL DATA:  Productive cough for 2 days EXAM: CHEST 1 VIEW COMPARISON:  Portable exam 1551 hours compared to 05/09/2016 FINDINGS: Exam made available for interpretation on 06/22/2016. Tracheostomy tube projects over tracheal air column tip 5.8 cm above carina. Normal heart size, mediastinal contours and pulmonary vascularity. Subsegmental atelectasis at both lung bases greater on RIGHT. Lungs otherwise clear. No pleural effusion or pneumothorax. Diffuse osseous demineralization. G-tube noted in LEFT upper quadrant. IMPRESSION: Subsegmental atelectasis at both lung bases RIGHT greater than LEFT. Electronically Signed   By: Lavonia Dana M.D.   On: 06/22/2016 09:07   US Breast Ltd Uni Left Inc Axilla  Result Date: 06/30/2016 CLINICAL DATA:  66 year old female presenting for evaluation of a palpable lump in the left breast.The patient has history of atypical lobular hyperplasia in the left breast in 2012 with was surgically excised. She had calcifications near the nipple in the right breast at the 3 o'clock position biopsied in 2014 which were found to be benign fibrocystic changes. The clip did not deploy at the time of the procedure. The more posterior group of calcifications in the right breast were not amenable to sampling. EXAM: 2D DIGITAL DIAGNOSTIC BILATERAL MAMMOGRAM WITH CAD AND ADJUNCT TOMO LEFT BREAST ULTRASOUND COMPARISON:  Previous exam(s). ACR Breast Density Category c: The breast tissue is heterogeneously dense, which may obscure small masses. FINDINGS: A BB has been placed on the upper inner quadrant of the left breast at the palpable site of concern. There are  no definite mammographic findings deep to the palpable marker, however, due to the position of the mass, and the patient's wheelchair status, it is possible that the mass was not included in the field of  view. In the slightly upper inner quadrant of the right breast, middle depth there is a 1.7 cm group of amorphous calcifications. Layering cannot be confirmed on the true lateral view due to motion artifact despite multiple repeat images. No other suspicious calcifications, masses or areas of distortion are seen in the bilateral breasts. Mammographic images were processed with CAD. Physical exam of the palpable site in the upper slightly inner left breast demonstrates a firm easily palpable mass. Ultrasound targeted to the palpable site at 11 o'clock, 5 cm from the nipple in the left breast demonstrates an irregular hypoechoic mass with indistinct margins measuring 1.5 x 0.9 x 1.3 cm. Ultrasound of the left axilla was difficult as the patient was scanned in her wheelchair, but only normal lymph nodes are identified. IMPRESSION: 1. There is a highly suspicious mass in the left breast at 11 o'clock which corresponds with the palpable site of concern. 2.  No evidence of left axillary lymphadenopathy. 3. There is an indeterminate group of calcifications in the slightly upper inner quadrant of the right breast. RECOMMENDATION: 1. Ultrasound-guided biopsy is recommended for the palpable left breast mass. This has been added on to today's interventional schedule, and dictated in a separate report. 2. Attempted stereotactic biopsy is recommended for the right breast calcifications. As the patient is in a wheelchair, and the breast is very thin when compressed, it may be that stereotactic biopsy is not possible. If this is the case, we can suggest possible excisional surgical biopsy. The stereotactic biopsy has been scheduled for 07/07/2016 at 12:45 p.m. I have discussed the findings and recommendations with the patient. Results were also provided in writing at the conclusion of the visit. If applicable, a reminder letter will be sent to the patient regarding the next appointment. BI-RADS CATEGORY  5: Highly suggestive of  malignancy. Electronically Signed   By: Ammie Ferrier M.D.   On: 06/30/2016 16:57   Mm Diag Breast Tomo Bilateral  Result Date: 06/30/2016 CLINICAL DATA:  66 year old female presenting for evaluation of a palpable lump in the left breast.The patient has history of atypical lobular hyperplasia in the left breast in 2012 with was surgically excised. She had calcifications near the nipple in the right breast at the 3 o'clock position biopsied in 2014 which were found to be benign fibrocystic changes. The clip did not deploy at the time of the procedure. The more posterior group of calcifications in the right breast were not amenable to sampling. EXAM: 2D DIGITAL DIAGNOSTIC BILATERAL MAMMOGRAM WITH CAD AND ADJUNCT TOMO LEFT BREAST ULTRASOUND COMPARISON:  Previous exam(s). ACR Breast Density Category c: The breast tissue is heterogeneously dense, which may obscure small masses. FINDINGS: A BB has been placed on the upper inner quadrant of the left breast at the palpable site of concern. There are no definite mammographic findings deep to the palpable marker, however, due to the position of the mass, and the patient's wheelchair status, it is possible that the mass was not included in the field of view. In the slightly upper inner quadrant of the right breast, middle depth there is a 1.7 cm group of amorphous calcifications. Layering cannot be confirmed on the true lateral view due to motion artifact despite multiple repeat images. No other suspicious calcifications, masses or areas of distortion  are seen in the bilateral breasts. Mammographic images were processed with CAD. Physical exam of the palpable site in the upper slightly inner left breast demonstrates a firm easily palpable mass. Ultrasound targeted to the palpable site at 11 o'clock, 5 cm from the nipple in the left breast demonstrates an irregular hypoechoic mass with indistinct margins measuring 1.5 x 0.9 x 1.3 cm. Ultrasound of the left axilla was  difficult as the patient was scanned in her wheelchair, but only normal lymph nodes are identified. IMPRESSION: 1. There is a highly suspicious mass in the left breast at 11 o'clock which corresponds with the palpable site of concern. 2.  No evidence of left axillary lymphadenopathy. 3. There is an indeterminate group of calcifications in the slightly upper inner quadrant of the right breast. RECOMMENDATION: 1. Ultrasound-guided biopsy is recommended for the palpable left breast mass. This has been added on to today's interventional schedule, and dictated in a separate report. 2. Attempted stereotactic biopsy is recommended for the right breast calcifications. As the patient is in a wheelchair, and the breast is very thin when compressed, it may be that stereotactic biopsy is not possible. If this is the case, we can suggest possible excisional surgical biopsy. The stereotactic biopsy has been scheduled for 07/07/2016 at 12:45 p.m. I have discussed the findings and recommendations with the patient. Results were also provided in writing at the conclusion of the visit. If applicable, a reminder letter will be sent to the patient regarding the next appointment. BI-RADS CATEGORY  5: Highly suggestive of malignancy. Electronically Signed   By: Ammie Ferrier M.D.   On: 06/30/2016 16:57   Mm Clip Placement Right  Result Date: 07/07/2016 CLINICAL DATA:  Patient status post stereotactic guided core needle biopsy right breast calcifications. EXAM: DIAGNOSTIC RIGHT MAMMOGRAM POST STEREOTACTIC BIOPSY COMPARISON:  Previous exam(s). FINDINGS: Mammographic images were obtained following stereotactic guided biopsy of right breast calcifications. The coil shaped biopsy marking clip is located approximately 3 cm superior to the suspected area of biopsy calcifications on the true lateral view. Clip migration likely secondary to bleeding postprocedure. IMPRESSION: Approximate 3 cm superior migration biopsy marking clip status  post stereotactic guided biopsy right breast calcifications. Final Assessment: Post Procedure Mammograms for Marker Placement Electronically Signed   By: Lovey Newcomer M.D.   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 This was found to be concordant by Dr. Lovey Newcomer. Pathology results were discussed with the patient's husband, Judy Spears, by telephone. The patient did well after the biopsy. Post biopsy instructions and care were reviewed and questions were answered. The patient was encouraged to call [facility] for any additional concerns. Surgical consultation has been arranged with Dr. Rolm Bookbinder at [facility] on July 02, 2016. Additionally, patient will return to the [facility] for biopsy of indeterminate calcifications within the right breast. Pathology results reported by Susa Raring RN, BSN on 07/02/2016. Electronically Signed   By: Lovey Newcomer M.D.   On: 07/02/2016 09:55   Result Date: 07/06/2016 CLINICAL DATA:  Patient with suspicious palpable left breast mass. EXAM: ULTRASOUND GUIDED LEFT BREAST CORE NEEDLE BIOPSY COMPARISON:  Previous exam(s). FINDINGS: I met with the patient and we discussed the procedure of ultrasound-guided biopsy, including benefits  and alternatives. We discussed the high likelihood of a successful procedure. We discussed the risks of the procedure, including infection, bleeding, tissue injury, clip migration, and inadequate sampling. Informed written consent was given. The usual time-out protocol was performed immediately prior to the procedure. Lesion quadrant: Upper inner quadrant Using sterile technique and 1% Lidocaine as local anesthetic, under direct ultrasound visualization, a 12 gauge spring-loaded  device was used to perform biopsy of suspicious palpable left breast mass 11 o'clock position using a medial approach. At the conclusion of the procedure a HydroMARK tissue marker clip was deployed into the biopsy cavity. IMPRESSION: Ultrasound guided biopsy of palpable left breast mass 11 o'clock position. No apparent complications. As the mass was located in a position not amenable to visualization with mammography, a HydroMARK clip was placed and visualized under ultrasound. No post clip mammograms were obtained. Electronically Signed: By: Lovey Newcomer M.D. On: 07/01/2016 14:44   Mm Rt Breast Bx W Loc Dev 1st Lesion Image Bx Spec Stereo Guide  Addendum Date: 07/08/2016   ADDENDUM REPORT: 07/08/2016 11:35 ADDENDUM: Pathology revealed fibrocystic change and usual ductal hyperplasia with calcifications and focal fat necrosis in the RIGHT breast. This was found to be concordant by Dr. Lovey Newcomer. Pathology results were discussed with the patient's husband by telephone. The patient did well after the biopsy with minimal tenderness. Post biopsy instructions and care were reviewed and questions were answered. The patient was encouraged to call [facility] for any additional concerns. The patient has a recent diagnosis of LEFT breast cancer and should follow her outlined treatment plan. Pathology results reported by Susa Raring RN, BSN on 07/08/2016. Electronically Signed   By: Lovey Newcomer M.D.   On: 07/08/2016 11:35   Result Date: 07/08/2016 CLINICAL DATA:  Patient with indeterminate right breast calcifications. EXAM: RIGHT BREAST STEREOTACTIC CORE NEEDLE BIOPSY COMPARISON:  Previous exams. FINDINGS: The patient and I discussed the procedure of stereotactic-guided biopsy including benefits and alternatives. We discussed the high likelihood of a successful procedure. We discussed the risks of the procedure including infection, bleeding, tissue injury, clip migration, and inadequate  sampling. Informed written consent was given. The usual time out protocol was performed immediately prior to the procedure. Using sterile technique and 1% Lidocaine as local anesthetic, under stereotactic guidance, a 9 gauge vacuum assisted device was used to perform core needle biopsy of calcifications within the upper inner right breast anterior depth using a cranial approach. Specimen radiograph was performed showing calcification. Specimens with calcifications are identified for pathology. Lesion quadrant: Upper inner quadrant At the conclusion of the procedure, a coil shaped tissue marker clip was deployed into the biopsy cavity. Follow-up 2-view mammogram was performed and dictated separately. IMPRESSION: Stereotactic-guided biopsy of right breast calcifications. No apparent complications. Electronically Signed: By: Lovey Newcomer M.D. On: 07/07/2016 14:52    ELIGIBLE FOR AVAILABLE RESEARCH PROTOCOL:no  ASSESSMENT: 66 y.o. [place] woman status post left breast upper inner quadrant biopsy 06/30/2016 for a clinical T1c N0, stage IA invasive ductal carcinoma, grade 2, estrogen and progesterone receptor positive, HER-2 nonamplified, with an MIB-1 of 12%  (a) biopsy of an area of calcifications in the upper inner right breast 07/07/2016 showed fat necrosis  (1) anastrozole started neoadjuvantly 07/14/2016  (2) definitive surgery pending: consider lumpectomy plus or minus intraoperative radiation  (3) osteoporosis prevention: Consider denosumab/Prolia   PLAN: We spent the better part of today's 40 minute appointment discussing the biology of breast cancer in general, and the specifics  of the [name]'s tumor in particular. All the information was given to the patient in writing.  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 Her main project at this point is to continue rehabilitation and try to regain as much function as possible.  Herbreast cancer should be curable  and certainly controllable. It would be reasonable to try to minimize interventions so as not to interfere with the main rehabilitation issues. In this regard I am concerned about the possible effect of a more complex surgery requiring prolonged anesthesia. For that reason I am attracted by the idea of lumpectomy under nerve block plus local anesthesia. I think this could be easily accomplished and would not necessarily need to be followed by adjuvant radiation: I would anticipate good control with anti-estrogens and we could follow with bi-annual imaging, with mammography or ultrasonography, whichever is easiest for the patient  Another option suggested by Dr. Sondra Come was lumpectomy with intraoperative radiation. This is not available in [place] but could be done at [facility] The patient and her family are already in contact with Dr.Ollila and they will discuss that option among others with him  While all this is being decided, I think it would be prudent to go ahead and start anti-estrogens. We discussed the difference between tamoxifen and the aromatase inhibitors and reviewed the main side effects, toxicities and complications of these agents. Given the mobility issue with Judy Spears at present I would prefer to stay away from tamoxifen. I went ahead and wrote theprescription for anastrozole and they can start that now.  If she is able to tolerate this well, at the next visit we can discuss osteoporosis prevention issues and particularly whether adding Prolia would be helpful.  I am planning to see them again in about 6 weeks. I expect by that time they will have made a decision regarding surgery but they understand while under systemic therapy there is no rush and the longer they take the anastrozole the easier the surgery will be as we certainly do expect the tumor to shrink under this treatment.  They know to call for any problems that may develop before the next visit here.  Chauncey Cruel, MD    07/14/2016 6:12 PM Medical Oncology and Hematology [facility] [address] [place], [place] [address] Tel. [phone]    Fax. [phone]

## 2016-07-14 NOTE — Progress Notes (Signed)
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 female who is seen out courtesy of Dr. Donne Hazel. The patient has a history of atypical lobular hyperplasia in the left breast in 2012 which was surgically excised. She had calcifications near the nipple in the right breast at the 3 o'clock position biopsied in 2014 which were found to be benign fibrocystic changes. The patient presented for diagnostic mammogram and ultrasound on 06/30/2016 to evaluate a palpable lump in the left breast which was noticed by her caregiver [name]. Interpretation from the study was a  highly suspicious mass in the left breast at 11 o'clock which corresponds with the palpable site of concern but this area could not be imaged as the patient was in a wheelchair. On ultrasound this lesion measured 1.5 x 1.3 x 0.9 cm. No evidence of left axillary lymphadenopathy. There was an indeterminate group of calcifications in the slightly upper inner quadrant of the right breast.   Biopsy of the left breast at 11 o'clock position was performed on 06/30/2016 revealing invasive ductal carcinoma, grade 2, ER 100%/ PR 5%/ HER-2 negative/ Ki-67 12%. Immunostain for E-cadherin is positive, supporting the diagnosis of invasive ductal carcinoma. 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  The patient was seen by oncologist Dr. Janan Halter at [facility] on 07/05/2016, prior to ER and PR receptor status being known.  Patient has been tentatively scheduled for surgery both at [facility] with Dr. Darrick Grinder and  here in [place] with Dr. Donne Hazel.   Biopsy of the upper inner right breast was performed on 07/07/2016. This showed fibrocystic change and usual ductal hyperplasia with calcifications and focal fat necrosis. There was no malignancy identified.  Of note, the patient was last seen on 06/18/2016 by pulmonologist Dr. 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     The patient is here to discuss radiation treatment in the management of her disease.   PREVIOUS RADIATION THERAPY: No  PAST MEDICAL HISTORY:  has a past medical history of Arthritis; Complication of anesthesia; Hair loss; History of exercise stress test; Hyperlipidemia; Insomnia; Migraine headache; Movement disorder; Postmenopausal; [place] encephalitis; and Premature atrial contractions.    PAST SURGICAL HISTORY: Past Surgical History:  Procedure Laterality Date  . BREAST BIOPSY  01/01/2011   Procedure: BREAST BIOPSY WITH NEEDLE LOCALIZATION;  Surgeon: Rolm Bookbinder, MD;  Location: [facility];  Service: General;  Laterality: Left;  left breast wire localization biopsy  . BREAST BIOPSY Right 07/27/2012  . BREAST BIOPSY Right 07/07/2016  . BREAST BIOPSY Left 06/30/2016  . cataracts right eye    . CESAREAN SECTION  1986  . HERNIA REPAIR  2000   RIH  . opirectinal membrane peel    . PEG PLACEMENT    . RHINOPLASTY  1983  . TRACHEOSTOMY      FAMILY HISTORY: family history includes Colon cancer in her father; Heart attack (age of onset: 18) in her maternal grandfather; Hypertension in her mother; Prostate cancer in her father; Stroke in her mother; Stroke (age of  onset: 85) in her maternal uncle.  SOCIAL HISTORY:  reports that she has never smoked. She has never used smokeless tobacco. She reports that she drinks alcohol. She reports that she does not use drugs.  ALLERGIES: Cefepime; Tetracyclines & related; and Pollen extract  MEDICATIONS:  Current Outpatient Prescriptions    Medication Sig Dispense Refill  . amantadine (SYMMETREL) 50 MG/5ML solution Place 15 mLs (150 mg total) into feeding tube 2 (two) times daily. In am and at noon 473 mL 0  . amantadine (SYMMETREL) 50 MG/5ML solution PLACE 28mL INTO FEEDING TUBE TWICE DAILY 900 mL 0  . amLODipine (NORVASC) 5 MG tablet PLACE 1 TABLET INTO FEEDING TUBE DAILY 30 tablet 2  . atorvastatin (LIPITOR) 20 MG tablet Place 20 mg into feeding tube at bedtime.     . baclofen (LIORESAL) 10 MG tablet Place 1 tablet (10 mg total) into feeding tube every 6 (six) hours. 120 each 5  . chlorhexidine (PERIDEX) 0.12 % solution 32ml IN THE MOUTH OR THROAT TWICE DAILY 120 mL 6  . chlorhexidine gluconate, MEDLINE KIT, (PERIDEX) 0.12 % solution 15 mLs by Mouth Rinse route 2 (two) times daily. (Patient taking differently: 5 mLs by Mouth Rinse route 2 (two) times daily. ) 120 mL 0  . Coenzyme Q10 (COQ10 PO) Take 10 mLs by mouth at bedtime.    [name] CRANBERRY JUICE EXTRACT PO Take by mouth.    . diazepam (VALIUM) 2 MG tablet PLACE 1 TABLET INTO FEEDING TUBE AT BEDTIME 30 tablet 1  . docusate (COLACE) 60 MG/15ML syrup Place 100 mg into feeding tube daily. Tales once daily, but can take 1 addt'l dose as needed for constipation    . famotidine (PEPCID) 20 MG tablet Take 1 tablet (20 mg total) by mouth 2 (two) times daily. 60 tablet 3  . fluticasone (FLONASE) 50 MCG/ACT nasal spray Place 1 spray into both nostrils 2 (two) times daily.    [name] guaiFENesin (ROBITUSSIN) 100 MG/5ML SOLN Place 10 mLs (200 mg total) into feeding tube 4 (four) times daily. 1200 mL 0  . Hydrocortisone (GERHARDT'S BUTT CREAM) CREA Apply 1 application topically 4 (four) times daily. 1 each 0  . loratadine (CLARITIN) 10 MG tablet Place 1 tablet (10 mg total) into feeding tube daily as needed for allergies. (Patient taking differently: Place 10 mg into feeding tube every morning. )    . Melatonin 3 MG TABS Place 6 mg into feeding tube every evening.    . Multiple Vitamin  (MULTIVITAMIN) LIQD Place 15 mLs into feeding tube daily. 1 Bottle 3  . nystatin (MYCOSTATIN/NYSTOP) powder Apply 1 g topically 2 (two) times daily as needed. (Patient taking differently: Apply 1 g topically 2 (two) times daily as needed (irritation). ) 45 g 0  . potassium chloride 20 MEQ/15ML (10%) SOLN Place 15 mLs (20 mEq total) into feeding tube daily. 900 mL 5  . Probiotic Product (PROBIOTIC ADVANCED) CAPS Take 1 capsule by mouth daily.    . prochlorperazine (COMPAZINE) 5 MG tablet Place 1-2 tablets (5-10 mg total) into feeding tube every 6 (six) hours as needed for nausea. 30 tablet 0  . simethicone (MYLICON) 40 MG/0.6ML drops Place 0.6 mLs (40 mg total) into feeding tube 4 (four) times daily as needed for flatulence. 45 mL 0  . valACYclovir (VALTREX) 500 MG tablet PLACE 1 TABLET INTO FEEDING TUBE DAILY 30 tablet 1  . Water For Irrigation, Sterile (FREE WATER) SOLN Place 150 mLs into feeding tube See admin instructions. Takes  5 times daily, if over 267m-hold water and recheck within an hour    . anastrozole (ARIMIDEX) 1 MG tablet Take 1 tablet (1 mg total) by mouth daily. 90 tablet 4   No current facility-administered medications for this encounter.     REVIEW OF SYSTEMS: Patient does not appear to have pain in either breast. No reports nipple discharge or bleeding    PHYSICAL EXAM:  height is _0  (1.626 m) and weight is 102 lb 9.6 oz (46.5 kg). Her oral temperature is 97.8 F (36.6 C). Her blood pressure is 132/86 and her pulse is 71. Her oxygen saturation is 100%.   General: Alert and oriented, in no acute distress. Patient remained in wheelchair for most of the examination. Very pleasant and attentive but no verbal communication. Appears to understand conversation. Is able to carry out some motor commands,  Accompanied by husband and caregiver LLattie Haw Neck: Neck is supple, no palpable cervical or supraclavicular lymphadenopathy. Patient has a tracheostomy in place in the lower  neck. Heart: Regular in rate and rhythm with no murmurs, rubs, or gallops. Chest: Clear to auscultation bilaterally, with no rhonchi, wheezes, or rales. Abdomen: Soft, nontender, nondistended, with no rigidity or guarding. Patient has a feeding tube in the upper stomach.Neurologic: Patient has a mild motor movement throughout the evaluation. Some spasticity. She does follow commands and is able to abduct her left arm.  With the assisstance of caregiver and nurse the patient was transitioned to the examination table and placed in a standard radiation position for treatment. Minimal movement was noted in this situation. Breast: Right breast reveals some bruising in the UIQ from recent benign biopsy. Left breast shows some bruising in the UIQ and a palpable mass measuring approximately 2.5 x 2.5 cm but some of this maybe related to bruising. No obvious skin involvement or chest wall fixation.    ECOG = 4  LABORATORY DATA:  Lab Results  Component Value Date   WBC 7.4 05/10/2016   HGB 13.3 05/10/2016   HCT 39.9 05/10/2016   MCV 88.7 05/10/2016   PLT 312 05/10/2016   NEUTROABS 4.2 05/10/2016   Lab Results  Component Value Date   NA 135 05/10/2016   K 3.2 (L) 05/10/2016   CL 97 (L) 05/10/2016   CO2 28 05/10/2016   GLUCOSE 146 (H) 05/10/2016   CREATININE <0.30 (L) 05/10/2016   CALCIUM 9.3 05/10/2016      RADIOGRAPHY: Dg Chest 1 View  Result Date: 06/22/2016 CLINICAL DATA:  Productive cough for 2 days EXAM: CHEST 1 VIEW COMPARISON:  Portable exam 1551 hours compared to 05/09/2016 FINDINGS: Exam made available for interpretation on 06/22/2016. Tracheostomy tube projects over tracheal air column tip 5.8 cm above carina. Normal heart size, mediastinal contours and pulmonary vascularity. Subsegmental atelectasis at both lung bases greater on RIGHT. Lungs otherwise clear. No pleural effusion or pneumothorax. Diffuse osseous demineralization. G-tube noted in LEFT upper quadrant. IMPRESSION:  Subsegmental atelectasis at both lung bases RIGHT greater than LEFT. Electronically Signed   By: MLavonia DanaM.D.   On: 06/22/2016 09:07   UKoreaBreast Ltd Uni Left Inc Axilla  Result Date: 06/30/2016 CLINICAL DATA:  66year old female presenting for evaluation of a palpable lump in the left breast.The patient has history of atypical lobular hyperplasia in the left breast in 2012 with was surgically excised. She had calcifications near the nipple in the right breast at the 3 o'clock position biopsied in 2014 which were found to be benign fibrocystic changes. The  clip did not deploy at the time of the procedure. The more posterior group of calcifications in the right breast were not amenable to sampling. EXAM: 2D DIGITAL DIAGNOSTIC BILATERAL MAMMOGRAM WITH CAD AND ADJUNCT TOMO LEFT BREAST ULTRASOUND COMPARISON:  Previous exam(s). ACR Breast Density Category c: The breast tissue is heterogeneously dense, which may obscure small masses. FINDINGS: A BB has been placed on the upper inner quadrant of the left breast at the palpable site of concern. There are no definite mammographic findings deep to the palpable marker, however, due to the position of the mass, and the patient's wheelchair status, it is possible that the mass was not included in the field of view. In the slightly upper inner quadrant of the right breast, middle depth there is a 1.7 cm group of amorphous calcifications. Layering cannot be confirmed on the true lateral view due to motion artifact despite multiple repeat images. No other suspicious calcifications, masses or areas of distortion are seen in the bilateral breasts. Mammographic images were processed with CAD. Physical exam of the palpable site in the upper slightly inner left breast demonstrates a firm easily palpable mass. Ultrasound targeted to the palpable site at 11 o'clock, 5 cm from the nipple in the left breast demonstrates an irregular hypoechoic mass with indistinct margins measuring  1.5 x 0.9 x 1.3 cm. Ultrasound of the left axilla was difficult as the patient was scanned in her wheelchair, but only normal lymph nodes are identified. IMPRESSION: 1. There is a highly suspicious mass in the left breast at 11 o'clock which corresponds with the palpable site of concern. 2.  No evidence of left axillary lymphadenopathy. 3. There is an indeterminate group of calcifications in the slightly upper inner quadrant of the right breast. RECOMMENDATION: 1. Ultrasound-guided biopsy is recommended for the palpable left breast mass. This has been added on to today's interventional schedule, and dictated in a separate report. 2. Attempted stereotactic biopsy is recommended for the right breast calcifications. As the patient is in a wheelchair, and the breast is very thin when compressed, it may be that stereotactic biopsy is not possible. If this is the case, we can suggest possible excisional surgical biopsy. The stereotactic biopsy has been scheduled for 07/07/2016 at 12:45 p.m. I have discussed the findings and recommendations with the patient. Results were also provided in writing at the conclusion of the visit. If applicable, a reminder letter will be sent to the patient regarding the next appointment. BI-RADS CATEGORY  5: Highly suggestive of malignancy. Electronically Signed   By: Ammie Ferrier M.D.   On: 06/30/2016 16:57   Mm Diag Breast Tomo Bilateral  Result Date: 06/30/2016 CLINICAL DATA:  66 year old female presenting for evaluation of a palpable lump in the left breast.The patient has history of atypical lobular hyperplasia in the left breast in 2012 with was surgically excised. She had calcifications near the nipple in the right breast at the 3 o'clock position biopsied in 2014 which were found to be benign fibrocystic changes. The clip did not deploy at the time of the procedure. The more posterior group of calcifications in the right breast were not amenable to sampling. EXAM: 2D DIGITAL  DIAGNOSTIC BILATERAL MAMMOGRAM WITH CAD AND ADJUNCT TOMO LEFT BREAST ULTRASOUND COMPARISON:  Previous exam(s). ACR Breast Density Category c: The breast tissue is heterogeneously dense, which may obscure small masses. FINDINGS: A BB has been placed on the upper inner quadrant of the left breast at the palpable site of concern. There are no  definite mammographic findings deep to the palpable marker, however, due to the position of the mass, and the patient's wheelchair status, it is possible that the mass was not included in the field of view. In the slightly upper inner quadrant of the right breast, middle depth there is a 1.7 cm group of amorphous calcifications. Layering cannot be confirmed on the true lateral view due to motion artifact despite multiple repeat images. No other suspicious calcifications, masses or areas of distortion are seen in the bilateral breasts. Mammographic images were processed with CAD. Physical exam of the palpable site in the upper slightly inner left breast demonstrates a firm easily palpable mass. Ultrasound targeted to the palpable site at 11 o'clock, 5 cm from the nipple in the left breast demonstrates an irregular hypoechoic mass with indistinct margins measuring 1.5 x 0.9 x 1.3 cm. Ultrasound of the left axilla was difficult as the patient was scanned in her wheelchair, but only normal lymph nodes are identified. IMPRESSION: 1. There is a highly suspicious mass in the left breast at 11 o'clock which corresponds with the palpable site of concern. 2.  No evidence of left axillary lymphadenopathy. 3. There is an indeterminate group of calcifications in the slightly upper inner quadrant of the right breast. RECOMMENDATION: 1. Ultrasound-guided biopsy is recommended for the palpable left breast mass. This has been added on to today's interventional schedule, and dictated in a separate report. 2. Attempted stereotactic biopsy is recommended for the right breast calcifications. As the  patient is in a wheelchair, and the breast is very thin when compressed, it may be that stereotactic biopsy is not possible. If this is the case, we can suggest possible excisional surgical biopsy. The stereotactic biopsy has been scheduled for 07/07/2016 at 12:45 p.m. I have discussed the findings and recommendations with the patient. Results were also provided in writing at the conclusion of the visit. If applicable, a reminder letter will be sent to the patient regarding the next appointment. BI-RADS CATEGORY  5: Highly suggestive of malignancy. Electronically Signed   By: Ammie Ferrier M.D.   On: 06/30/2016 16:57   Mm Clip Placement Right  Result Date: 07/07/2016 CLINICAL DATA:  Patient status post stereotactic guided core needle biopsy right breast calcifications. EXAM: DIAGNOSTIC RIGHT MAMMOGRAM POST STEREOTACTIC BIOPSY COMPARISON:  Previous exam(s). FINDINGS: Mammographic images were obtained following stereotactic guided biopsy of right breast calcifications. The coil shaped biopsy marking clip is located approximately 3 cm superior to the suspected area of biopsy calcifications on the true lateral view. Clip migration likely secondary to bleeding postprocedure. IMPRESSION: Approximate 3 cm superior migration biopsy marking clip status post stereotactic guided biopsy right breast calcifications. Final Assessment: Post Procedure Mammograms for Marker Placement Electronically Signed   By: Lovey Newcomer M.D.   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 This was found to be concordant by Dr. Lovey Newcomer. Pathology results were discussed with the patient's husband, [name] Simmonds, by telephone. The patient did well after the biopsy. Post biopsy instructions and care were reviewed and questions were  answered. The patient was encouraged to call [facility] for any additional concerns. Surgical consultation has been arranged with Dr. Rolm Bookbinder at [facility] on July 02, 2016. Additionally, patient  will return to the [facility] for biopsy of indeterminate calcifications within the right breast. Pathology results reported by Susa Raring RN, BSN on 07/02/2016. Electronically Signed   By: Lovey Newcomer M.D.   On: 07/02/2016 09:55   Result Date: 07/06/2016 CLINICAL DATA:  Patient with suspicious palpable left breast mass. EXAM: ULTRASOUND GUIDED LEFT BREAST CORE NEEDLE BIOPSY COMPARISON:  Previous exam(s). FINDINGS: I met with the patient and we discussed the procedure of ultrasound-guided biopsy, including benefits and alternatives. We discussed the high likelihood of a successful procedure. We discussed the risks of the procedure, including infection, bleeding, tissue injury, clip migration, and inadequate sampling. Informed written consent was given. The usual time-out protocol was performed immediately prior to the procedure. Lesion quadrant: Upper inner quadrant Using sterile technique and 1% Lidocaine as local anesthetic, under direct ultrasound visualization, a 12 gauge spring-loaded device was used to perform biopsy of suspicious palpable left breast mass 11 o'clock position using a medial approach. At the conclusion of the procedure a HydroMARK tissue marker clip was deployed into the biopsy cavity. IMPRESSION: Ultrasound guided biopsy of palpable left breast mass 11 o'clock position. No apparent complications. As the mass was located in a position not amenable to visualization with mammography, a HydroMARK clip was placed and visualized under ultrasound. No post clip mammograms were obtained. Electronically Signed: By: Lovey Newcomer M.D. On: 07/01/2016 14:44   Mm Rt Breast Bx W Loc Dev 1st Lesion Image Bx Spec Stereo Guide  Addendum Date:  07/08/2016   ADDENDUM REPORT: 07/08/2016 11:35 ADDENDUM: Pathology revealed fibrocystic change and usual ductal hyperplasia with calcifications and focal fat necrosis in the RIGHT breast. This was found to be concordant by Dr. Lovey Newcomer. Pathology results were discussed with the patient's husband by telephone. The patient did well after the biopsy with minimal tenderness. Post biopsy instructions and care were reviewed and questions were answered. The patient was encouraged to call [facility] for any additional concerns. The patient has a recent diagnosis of LEFT breast cancer and should follow her outlined treatment plan. Pathology results reported by Susa Raring RN, BSN on 07/08/2016. Electronically Signed   By: Lovey Newcomer M.D.   On: 07/08/2016 11:35   Result Date: 07/08/2016 CLINICAL DATA:  Patient with indeterminate right breast calcifications. EXAM: RIGHT BREAST STEREOTACTIC CORE NEEDLE BIOPSY COMPARISON:  Previous exams. FINDINGS: The patient and I discussed the procedure of stereotactic-guided biopsy including benefits and alternatives. We discussed the high likelihood of a successful procedure. We discussed the risks of the procedure including infection, bleeding, tissue injury, clip migration, and inadequate sampling. Informed written consent was given. The usual time out protocol was performed immediately prior to the procedure. Using sterile technique and 1% Lidocaine as local anesthetic, under stereotactic guidance, a 9 gauge vacuum assisted device was used to perform core needle biopsy of calcifications within the upper inner right breast anterior depth using a cranial approach. Specimen radiograph was performed showing calcification. Specimens with calcifications are identified for pathology. Lesion quadrant: Upper inner quadrant At the conclusion of the procedure, a coil shaped tissue marker clip was deployed into the biopsy cavity. Follow-up 2-view mammogram was  performed and dictated separately. IMPRESSION: Stereotactic-guided biopsy of right breast calcifications. No apparent complications. Electronically Signed: By: Lovey Newcomer M.D. 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  Based on evaluation today the patient could potentially have conventional radiation therapy but would be a significant challenge to the patient. If breast conservation therapy is not important to her and if medically she could tolerate a mastectomy, she could proceed with a mastectomy and avoid the issues of  daily radiation treatments. She would appear to be a good candidate for adjuvant hormonal therapy and will meet with Dr. Jana Hakim later today concerning this issue. Other options to consider given the patient's performance status would be to proceed with lumpectomy alone/ hormonal therapy and to hold off on radiation treatments. She is close to the age where adjuvant radiation therapy may be optional with early stage ER+ breast cancer (25 years).  Other possible options to consider would be to proceed with lumpectomy and intraoperative radiation therapy at [facility] if this is still available;  the benefit to this approach would be to avoid the difficulties of daily radiation treatment and would hopefully improve local control issues. Mr. Haser will consider discussing this with his contacts at [facility] if they wish to proceed with this evaluation.   PLAN: Final surgical management details are pending additional input from medical oncology and patient/family wishes.     ------------------------------------------------  Blair Promise, PhD, MD  This document serves as a record of services personally performed by Gery Pray, MD. It was created on his  behalf by Arlyce Harman, a trained medical scribe. The creation of this record is based on the scribe's personal observations and the provider's statements to them. This document has been checked and approved by the attending provider.

## 2016-07-14 NOTE — Telephone Encounter (Signed)
I would try the 3mg  or 5mg  dose whichever they can get.  Would hold it completely for now, however,  until she's more awake.  Also be alert for other causes of sedation including UTI and something else infectious or metabolic

## 2016-07-14 NOTE — Therapy (Signed)
[facility] [address] [place], [place], [address] Phone: [phone]   Fax:  [phone]  Speech Language Pathology Treatment  Patient Details  Name: Judy Spears MRN: [id number] Date of Birth: August 07, 1950 Referring Provider: Charlett Blake, MD  Encounter Date: 07/13/2016      End of Session - 07/14/16 1926    Visit Number 36   Number of Visits 41   Date for SLP Re-Evaluation 07/30/16   SLP Start Time 1148   SLP Stop Time  1233   SLP Time Calculation (min) 45 min   Activity Tolerance Patient tolerated treatment well      Past Medical History:  Diagnosis Date   Arthritis    lt great toe   Complication of anesthesia    pt has had headaches post op-did advise to hydra well preop   Hair loss    right-sided   History of exercise stress test    a. ETT (12/15) with 12:00 exercise, no ST changes, occasional PACs.    Hyperlipidemia    Insomnia    Migraine headache    Movement disorder    resltess in left legs   Postmenopausal    Powassan encephalitis    Premature atrial contractions    a. Holter (12/15) with 8% PACs, no atrial runs or atrial fibrillation.     Past Surgical History:  Procedure Laterality Date   BREAST BIOPSY  01/01/2011   Procedure: BREAST BIOPSY WITH NEEDLE LOCALIZATION;  Surgeon: Rolm Bookbinder, MD;  Location: [facility];  Service: General;  Laterality: Left;  left breast wire localization biopsy   BREAST BIOPSY Right 07/27/2012   BREAST BIOPSY Right 07/07/2016   BREAST BIOPSY Left 06/30/2016   cataracts right eye     [place]   RIH   opirectinal membrane peel     PEG [place]      There were no vitals filed for this visit.      Subjective Assessment - 07/13/16 1150    Subjective "She's much better today." Pt similing, makes eye contact   Patient is accompained  by: Family member   Special Tests spouse, [name]; caregiver [name]   Currently in Pain? No/denies               ADULT SLP TREATMENT - 07/13/16 1150      General Information   Behavior/Cognition Alert;Cooperative;Pleasant mood   Patient Positioning Upright in chair   Oral care provided Yes     Treatment Provided   Treatment provided Dysphagia;Cognitive-Linquistic     Dysphagia Treatment   Temperature Spikes Noted No   Respiratory Status Room air   Oral Cavity - Dentition Adequate natural dentition   Treatment Methods Skilled observation;Compensation strategy training;Therapeutic exercise   Patient observed directly with PO's Yes   Type of PO's observed Thin liquids   Feeding Needs assist   Liquids provided via Cup   Oral Phase Signs & Symptoms Anterior loss/spillage   Type of cueing Verbal;Visual;Tactile   Amount of cueing Moderate   Other treatment/comments Minimal PO trials observed today as dysphagia treatment targeted RMT training. Pt performed 3 sets of 5 repetitions using inspiratory trainer with device set to 13cm pressure. Initially required max cues and lower device settings, SLP increased resistance and faded cues to moderate. Attempted training with expiratory device, however SLP noted today pt with air leak from  trach, limiting her ability to redirect sufficient air to meet device resistance.     Pain Assessment   Pain Assessment No/denies pain     Cognitive-Linquistic Treatment   Treatment focused on Voice;Dysarthria   Skilled Treatment SLP facilitated short phrase level responses today, 3 words on average, mod cues for vocal amplitude, breath coordination.     Tracheostomy Shiley 4 mm Uncuffed   Tracheostomy Properties Placement Date: 05/08/16 Placement Time: 1030 Placed By: ICU physician Brand: Shiley Size (mm): 4 mm Style: Uncuffed   Status Capped   Site Assessment Clean;Dry   Ties Assessment Clean;Dry;Secure     Assessment / Recommendations / Plan   Plan  Continue with current plan of care     Dysphagia Recommendations   Diet recommendations Dysphagia 1 (puree);Thin liquid   Liquids provided via Teaspoon;Cup   Medication Administration Via alternative means   Supervision Full supervision/cueing for compensatory strategies   Compensations Effortful swallow;Multiple dry swallows after each bite/sip;Check for anterior loss;Small sips/bites;Slow rate;Check for pocketing   Postural Changes and/or Swallow Maneuvers Seated upright 90 degrees     Progression Toward Goals   Progression toward goals Progressing toward goals   Patient/Family Stated Goal Eat and drink by mouth, improve cough, build breath support for increased vocal amplitude          SLP Education - 07/13/16 1150    Education provided Yes   Education Details Plan of care, training in home exercise program for RMT. Recommend continuning program at home after this 4 weeks of therapy completes, returning for additional voice/dysarthria treatment after decannulation.   Person(s) Educated Patient;Spouse;Caregiver(s)   Methods Explanation;Verbal cues   Comprehension Other (comment)  request continuation of therapy          SLP Short Term Goals - 07/13/16 1150      SLP SHORT TERM GOAL #1   Title Patient will utilize intelligibility strategies with moderate visual cues from communication partner/visual aid to communicate 5-7 word wants, needs, thoughts and ideas.   Status Achieved     SLP SHORT TERM GOAL #2   Title Patient will utilize intelligibility strategies, breath support with mod A for phrase repetition in 75% of trials.   Status Achieved     SLP SHORT TERM GOAL #3   Title Pt will swallow thin liquids, purees and soft solids in 9/10 trials with no overt signs of aspiration.   Status Achieved     SLP SHORT TERM GOAL #4   Title Patient will tolerate 6oz water, >95% with no overt signs of aspiration.   Status Achieved     SLP SHORT TERM GOAL #5   Title Patient will  utilize verbal cues for lip closure, dry swallow for secretion management in 75% of opportunities.   Status Achieved          SLP Long Term Goals - 07/13/16 1150      SLP LONG TERM GOAL #1   Title Patient will utilize multimodal communication/AAC to clarify wants, needs, thoughts or ideas during communication breakdowns with max A   Status Deferred     SLP LONG TERM GOAL #2   Title Patient will utilize intelligibility strategies, breath support with min A visual cues for 5-7 word responses with 80% accuracy.   Time 2   Period Weeks   Status On-going     SLP LONG TERM GOAL #3   Title Patient will demonstrate readiness for MBS to determine safety for pleasure feeds by timely initiation of swallow in  3/5 trials with ice chips/H20.   Status Achieved     SLP LONG TERM GOAL #4   Title Pt will consume pleasure feeds of desired consistency (soft solids and thin liquids) with signs of aspiration in fewer than 10% of trials with caregiver providing moderate cues and monitoring for overt signs of aspiration.   Time 2   Period Weeks   Status On-going     SLP LONG TERM GOAL #5   Title Patient will utilize visual cue for lip closure, dry swallow for secretion management in 75% of opportunities.   Status Achieved     SLP LONG TERM GOAL #6   Title Pt will demo HEP for RMT, dysphagia with mod caregiver assistance.   Time 2   Period Weeks   Status On-going          Plan - 07/13/16 1150    Clinical Impression Statement Patients demeanor and attention improved today; pt smiling, responsive and following commands well for RMT training. Noted significant air escape during expiratory training from tracheal site; educated pt, caregiver and husband that this likely a factor in pts difficulty building pressure for cough, voicing. Educated re: plan of care and recommendation for d/c as caregivers become comfortable in use of RMT at home and possible return to therapy after decannulation, as  pts progress toward voice/speech goals has been limited. Pt will benefit from continued skilled ST to address underlying physical impairments impacting swallowing and speech, as well as education and training in monitoring for and reducing risks of aspiration.   Speech Therapy Frequency 2x / week   Duration 4 weeks   Treatment/Interventions Aspiration precaution training;Trials of upgraded texture/liquids;Compensatory strategies;Functional tasks;Patient/family education;Cueing hierarchy;Diet toleration management by SLP;Cognitive reorganization;Compensatory techniques;SLP instruction and feedback;Internal/external aids;Multimodal communcation approach;Pharyngeal strengthening exercises   Potential to Achieve Goals Fair   Potential Considerations Ability to learn/carryover information;Co-morbidities;Severity of impairments   SLP Home Exercise Plan RMT   Consulted and Agree with Plan of Care Family member/caregiver;Patient   Family Member Consulted husband chip, caregiver lisa      Patient will benefit from skilled therapeutic intervention in order to improve the following deficits and impairments:   Dysphagia, oropharyngeal phase  Dysarthria and anarthria  Cognitive communication deficit    Problem List Patient Active Problem List   Diagnosis Date Noted   Malignant neoplasm of upper-inner quadrant of left breast in female, estrogen receptor positive ([facility]) 07/14/2016   Heterotopic ossification of bone 03/10/2016   Acute on chronic respiratory failure with hypoxia (HCC)    Acute hypoxemic respiratory failure (HCC)    Acute blood loss anemia    Atelectasis 02/04/2016   Debility 02/03/2016   Pneumonia of both lower lobes due to Pseudomonas species ([place])    Pneumonia of both lower lobes due to methicillin susceptible Staphylococcus aureus (MSSA) ([place])    Acute respiratory failure with hypoxia (HCC)    Acute encephalopathy    Seizures ([place])    Sepsis ([place])    Hypoxia     Pain    HCAP (healthcare-associated pneumonia) 12/28/2015   Chronic respiratory failure ([facility]) 12/28/2015   Acute tracheobronchitis 12/24/2015   Tracheostomy tube present ([place])    Tracheal stenosis    Chronic respiratory failure with hypoxia ([facility])    Spastic tetraplegia ([facility]) 10/20/2015   Tracheostomy status ([place]) 09/26/2015   Allergic rhinitis 09/26/2015   Encephalitis, arthropod-borne viral 09/22/2015   Movement disorder 09/22/2015   Encephalitis 09/22/2015   Chest pain 02/07/2014   Premature atrial contractions 01/13/2014  Abnormality of gait 12/04/2013   Hyperlipidemia 12/21/2011   Cardiovascular risk factor 12/21/2011   Bunion 01/31/2008   Metatarsalgia of both feet 09/20/2007   FLAT FOOT 09/20/2007   HALLUX RIGIDUS, ACQUIRED 09/20/2007   Deneise Lever, [place], CCC-SLP Speech-Language Pathologist   Aliene Altes 07/14/2016, 7:45 PM  [facility] [name] [place], [place], [address] Phone: [phone]   Fax:  [phone]   Name: Judy Spears MRN: [id number] Date of Birth: 1950-08-26

## 2016-07-14 NOTE — Telephone Encounter (Signed)
Mr Feigel notified. She is more alert now and they are aware and watching her urine and fever status and appears to be good for now.

## 2016-07-14 NOTE — Progress Notes (Signed)
Please see the Nurse Progress Note in the MD Initial Consult Encounter for this patient. 

## 2016-07-15 ENCOUNTER — Ambulatory Visit: Payer: Medicare Other | Admitting: Occupational Therapy

## 2016-07-15 ENCOUNTER — Ambulatory Visit: Payer: Medicare Other | Admitting: Rehabilitation

## 2016-07-15 ENCOUNTER — Encounter: Payer: Self-pay | Admitting: Rehabilitation

## 2016-07-15 ENCOUNTER — Encounter: Payer: Self-pay | Admitting: Occupational Therapy

## 2016-07-15 ENCOUNTER — Telehealth: Payer: Self-pay | Admitting: *Deleted

## 2016-07-15 DIAGNOSIS — R293 Abnormal posture: Secondary | ICD-10-CM

## 2016-07-15 DIAGNOSIS — R29818 Other symptoms and signs involving the nervous system: Secondary | ICD-10-CM

## 2016-07-15 DIAGNOSIS — R41844 Frontal lobe and executive function deficit: Secondary | ICD-10-CM

## 2016-07-15 DIAGNOSIS — R2689 Other abnormalities of gait and mobility: Secondary | ICD-10-CM

## 2016-07-15 DIAGNOSIS — M25612 Stiffness of left shoulder, not elsewhere classified: Secondary | ICD-10-CM

## 2016-07-15 DIAGNOSIS — R2681 Unsteadiness on feet: Secondary | ICD-10-CM

## 2016-07-15 DIAGNOSIS — M6281 Muscle weakness (generalized): Secondary | ICD-10-CM

## 2016-07-15 DIAGNOSIS — M25611 Stiffness of right shoulder, not elsewhere classified: Secondary | ICD-10-CM

## 2016-07-15 DIAGNOSIS — R471 Dysarthria and anarthria: Secondary | ICD-10-CM | POA: Diagnosis not present

## 2016-07-15 DIAGNOSIS — M25622 Stiffness of left elbow, not elsewhere classified: Secondary | ICD-10-CM

## 2016-07-15 NOTE — Telephone Encounter (Signed)
Left vm on husband phone requesting return call. Contact information provided.

## 2016-07-15 NOTE — Therapy (Signed)
[facility] [address] [place], [place], [address] Phone: [phone]   Fax:  [phone]  Physical Therapy Treatment  Patient Details  Name: Judy Spears MRN: [id number] Date of Birth: 09/17/1950 Referring Provider: Alger Simons, MD/Andrew Letta Pate, MD  Encounter Date: 07/15/2016      PT End of Session - 07/15/16 1938    Visit Number 36   Number of Visits 47   Date for PT Re-Evaluation 08/23/16   Authorization Type [employer]   PT Start Time 1316   PT Stop Time 1402   PT Time Calculation (min) 46 min   Activity Tolerance Patient tolerated treatment well   Behavior During Therapy [facility] for tasks assessed/performed      Past Medical History:  Diagnosis Date  . Arthritis    lt great toe  . Complication of anesthesia    pt has had headaches post op-did advise to hydra well preop  . Hair loss    right-sided  . History of exercise stress test    a. ETT (12/15) with 12:00 exercise, no ST changes, occasional PACs.   . Hyperlipidemia   . Insomnia   . Migraine headache   . Movement disorder    resltess in left legs  . Postmenopausal   . 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 Premature atrial contractions    a. Holter (12/15) with 8% PACs, no atrial runs or atrial fibrillation.     Past Surgical History:  Procedure Laterality Date  . BREAST BIOPSY  01/01/2011   Procedure: BREAST BIOPSY WITH NEEDLE LOCALIZATION;  Surgeon: Rolm Bookbinder, MD;  Location: [facility];  Service: General;  Laterality: Left;  left breast wire localization biopsy  . BREAST BIOPSY Right 07/27/2012  . BREAST BIOPSY Right 07/07/2016  . BREAST BIOPSY Left 06/30/2016  . cataracts right eye    . CESAREAN SECTION  1986  . HERNIA REPAIR  2000   RIH  . opirectinal membrane peel    . PEG PLACEMENT    . RHINOPLASTY  1983  . TRACHEOSTOMY      There were no vitals filed for this visit.      Subjective Assessment - 07/15/16 1320     Subjective No big changes, no falls.  Still having some back pain    Pertinent History Precautions:  PEG.  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  Another round of botox in LLE on 02/06/16   Patient Stated Goals Per husband, "For [place] to be as independent as possible."   Currently in Pain? Yes   Pain Score 3    Pain Location Back   Pain Orientation Lower   Pain Descriptors / Indicators Aching   Pain Type Acute pain   Pain Onset In the past 7 days   Pain Frequency Intermittent   Aggravating Factors  sitting in chair   Pain Relieving Factors stretching                         [facility] Adult PT Treatment/Exercise - 07/15/16 0001      Bed Mobility   Right Sidelying to Sit 4: Min guard;4: Min assist   Right Sidelying to Sit Details (indicate cue type and reason) Cues for initiation and continued cues for management and use of RUE when returning to sitting.  Sit to Sidelying Right 4: Min assist   Sit to Sidelying Right Details (indicate cue type and reason) Light assist to guide LEs onto mat, however she did well getting onto R side with cues for managing RUE to avoid lying directly onto arm.       Transfers   Transfers Sit to Stand   Sit to Stand 4: Min guard;4: Min assist   Sit to Stand Details Tactile cues for initiation;Tactile cues for weight shifting;Manual facilitation for weight shifting   Sit to Stand Details (indicate cue type and reason) Pt requires min A without device, min/guard to guide trunk forward when PF RW placed in front of her.    Stand to Sit 4: Min guard   Stand to Sit Details (indicate cue type and reason) Verbal cues for technique;Tactile cues for placement   Stand to Sit Details Pt continues to require cues for increased trunk flexion, but she was able to perform  today with light min/guard facilitation for forward trunk flexion.       Ambulation/Gait   Ambulation/Gait Yes   Ambulation/Gait Assistance 4: Min assist   Ambulation/Gait Assistance Details Pt did much better today vs previous session with gait using B PFRW.  Provided single UE assist (from PT) to guide RW while PTs other hand provided facilitation at R hip/pelvis for improved forward protraction and weight shift to the L during L stance.  Pt doing better managing RW esp around turns.     Ambulation Distance (Feet) 115 Feet   Assistive device Bilateral platform walker   Gait Pattern Decreased stance time - right;Decreased hip/knee flexion - right;Decreased hip/knee flexion - left;Decreased weight shift to right;Scissoring;Ataxic   Ambulation Surface Level;Indoor     Neuro Re-ed    Neuro Re-ed Details  Prior to gait, worked on finding midline, upright posture, and improved L lateral weight weight shift and WB.  Provided pt with tactile cue of foam roller strapped to back (along spine) to cue upright posture.  Pt did very well with this, but when given task for weight shift, needed continued cues to maintain posture.  Following weight shifting, had pt work on lifting RLE in ballet and "tree pose" type move.  Pt able to maintain position x 10 secs with min A and cues for posture throughout.  Performed this x 5 reps.       Manual Therapy   Manual Therapy Joint mobilization   Manual therapy comments Provided seated thoracic stretch with overpressure at upper thoracic/rib cage area x 10 reps to promote L trunk opening/lengthening.                    PT Short Term Goals - 06/25/16 1205      PT SHORT TERM GOAL #1   Title Jay Back on pt's current manual wheelchair to be switched out to regular back; pt will demonstrate abilty to sit in wheelchair with upright with less supportive wheelchair back.  07-24-16   Baseline Jay Back on current wheelchair   Time 4   Period Weeks   Status New      PT SHORT TERM GOAL #2   Title Pt will perform sit to stand from wheelchair to RW with CGA.  07-24-16   Baseline min to mod assist needed with cues for positioning of feet and to lean forward - 06-24-16   Time 4   Period Weeks   Status New     PT SHORT TERM GOAL #3  Title Family to obtain bil. platform RW to begin assisting pt with ambulation in her home environment.  07-24-16   Baseline have been assessing ability to use bil. platform RW vs. UP walker   Time 4   Period Weeks   Status New     PT SHORT TERM GOAL #4   Title Family will report pt rolling from supine to Rt and Lt sides in hospital bed at home with supervision without use of bed rail with cues for positioning of LE's.  07-24-16   Baseline pt needs min assist at times - activity just demonstrated by pt on 06-24-16   Time 4   Period Weeks   Status New     PT SHORT TERM GOAL #5   Title Basic transfers from wheelchair to bed or commode with CGA.  07-24-16   Baseline min to mod assist needed - 06-24-16   Time 4   Period Weeks     PT SHORT TERM GOAL #6   Title Amb. 50' with bil. platform RW with CGA to begin amb. at home with assistance safely.  07-24-16   Baseline 115' with platform RW with min to mod assist   Time 4   Period Weeks   Status New           PT Long Term Goals - 06/25/16 1142      PT LONG TERM GOAL #1   Title Pt will perform supine rolling R and L at mod I level in order to indicate improved independence with bed mobility.  (Target Date: 06/25/16)/ 08-23-16  CONT. LTG til 08-23-16   Baseline inconsistent in performance - pt needs cues for LE flexion to assist with pushing for rolling and cues for UE positioning   Time 8   Period Weeks   Status On-going     PT LONG TERM GOAL #2   Title Pt will perform sit>SL R or L modified independently in order to indicate improved independence with getting into bed.   08-23-16   Baseline min to mod assist needed - 06-24-16   Time 8   Status New     PT LONG TERM GOAL  #3   Title Pt will transfer Lt/Rt sidelying to sitting with CGA for incr. independence with getting out of bed.  08-23-16   Baseline min to mod assist needed -06-24-16   Time 8   Period Weeks   Status New     PT LONG TERM GOAL #4   Title Pt will perform basic transfers with CGA to indicate improved functional independence.  08-23-16   Baseline min assist needed - 06-24-16   Time 8   Period Weeks   Status New     PT LONG TERM GOAL #5   Title Pt will perform static standing with bil. UE support with distant supervision for >/= 10" for improved standing balance.  08-23-16   Baseline close SBA to CGA needed - pt standing with bil UE support for 8-10" per caregiver's report -- 06-24-16   Time 8   Period Weeks   Status New     Additional Long Term Goals   Additional Long Term Goals Yes     PT LONG TERM GOAL #6   Title Pt will demonstrate ability to ambulate up to 25' w/ LRAD at min A level in order to assist with getting around home.  CONT LTG til 08-23-16; goal upgraded to 50'   Baseline bil. platform RW to be used in home  Time 8   Period Weeks   Status On-going     PT LONG TERM GOAL #7   Title Perform sit to stand and stand to sit to RW or to another object with CGA.  08-23-16   Baseline min assist needed - 06-24-16   Time 8   Period Weeks   Status New               Plan - 07/15/16 1939    Clinical Impression Statement Skilled session focused on pre-gait NMR for postural control and improved L lateral weight shift, improved trunk mobility, and gait with BPFRW.  Note marked improvement in gait today vs previous session and also note the best RLE placement that this PT has seen as she was able to land RLE more in line with R side of body vs her adducting RLE to midline of body.     Rehab Potential Fair   Clinical Impairments Affecting Rehab Potential Chronicity of condition, continued medical management, but has very strong family and financial support   PT Frequency 2x / week    PT Duration 8 weeks   PT Treatment/Interventions ADLs/Self Care Home Management;DME Instruction;Gait training;Stair training;Functional mobility training;Therapeutic activities;Patient/family education;Cognitive remediation;Neuromuscular re-education;Therapeutic exercise;Balance training;Orthotic Fit/Training;Wheelchair mobility training;Manual techniques;Splinting;Visual/perceptual remediation/compensation;Vestibular;Passive range of motion;Electrical Stimulation   PT Next Visit Plan cont gait, balance, transfer, bed mobility training; try technique for upright posture in standing (if pt able to tolerate); STG's due to be assessed by 07-24-16   Consulted and Agree with Plan of Care Patient;Family member/caregiver   Family Member Consulted caregiver, [name] and husband [name]      Patient will benefit from skilled therapeutic intervention in order to improve the following deficits and impairments:  Abnormal gait, Decreased balance, Decreased endurance, Decreased cognition, Cardiopulmonary status limiting activity, Decreased activity tolerance, Decreased coordination, Decreased strength, Impaired flexibility, Impaired tone, Decreased mobility, Increased muscle spasms, Impaired vision/preception, Decreased range of motion, Impaired UE functional use, Postural dysfunction, Improper body mechanics, Impaired perceived functional ability, Decreased knowledge of precautions, Decreased knowledge of use of DME  Visit Diagnosis: Unsteadiness on feet  Muscle weakness (generalized)  Abnormal posture  Other symptoms and signs involving the nervous system  Other abnormalities of gait and mobility     Problem List Patient Active Problem List   Diagnosis Date Noted  . Malignant neoplasm of upper-inner quadrant of left breast in female, estrogen receptor positive ([place]) 07/14/2016  . Heterotopic ossification of bone 03/10/2016  . Acute on chronic respiratory failure with hypoxia ([place])   . Acute hypoxemic  respiratory failure ([place])   . Acute blood loss anemia   . Atelectasis 02/04/2016  . Debility 02/03/2016  . Pneumonia of both lower lobes due to Pseudomonas species ([place])   . Pneumonia of both lower lobes due to methicillin susceptible Staphylococcus aureus (MSSA) ([place])   . Acute respiratory failure with hypoxia ([place])   . Acute encephalopathy   . Seizures ([place])   . Sepsis ([place])   . Hypoxia   . Pain   . HCAP (healthcare-associated pneumonia) 12/28/2015  . Chronic respiratory failure ([place]) 12/28/2015  . Acute tracheobronchitis 12/24/2015  . Tracheostomy tube present ([place])   . Tracheal stenosis   . Chronic respiratory failure with hypoxia ([place])   . Spastic tetraplegia ([place]) 10/20/2015  . Tracheostomy status ([place]) 09/26/2015  . Allergic rhinitis 09/26/2015  . 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 Movement disorder 09/22/2015  . Encephalitis 09/22/2015  . Chest pain 02/07/2014  . Premature atrial contractions 01/13/2014  .  Abnormality of gait 12/04/2013  . Hyperlipidemia 12/21/2011  . Cardiovascular risk factor 12/21/2011  . Bunion 01/31/2008  . Metatarsalgia of both feet 09/20/2007  . FLAT FOOT 09/20/2007  . HALLUX RIGIDUS, ACQUIRED 09/20/2007    Cameron Sprang, PT, MPT [facility] [address] [place], [place], [address] Phone: [phone]   Fax:  [phone] 07/15/16, 7:43 PM  Name: Judy Spears MRN: [id number] Date of Birth: [birth year]

## 2016-07-15 NOTE — Therapy (Signed)
[facility] [address] [place], [place], [address] Phone: [phone]   Fax:  [phone]  Occupational Therapy Treatment  Patient Details  Name: Judy Spears MRN: [id number] Date of Birth: 29-May-1950 Referring Provider: Dr. Quita Skye. [name]  Encounter Date: 07/15/2016      OT End of Session - 07/15/16 1505    Visit Number 36   Number of Visits 56   Date for OT Re-Evaluation 09/07/16   Authorization Type BCBS unlimited visits   OT Start Time 1402   OT Stop Time 1445   OT Time Calculation (min) 43 min   Activity Tolerance Patient tolerated treatment well      Past Medical History:  Diagnosis Date  . Arthritis    lt great toe  . Complication of anesthesia    pt has had headaches post op-did advise to hydra well preop  . Hair loss    right-sided  . History of exercise stress test    a. ETT (12/15) with 12:00 exercise, no ST changes, occasional PACs.   . Hyperlipidemia   . Insomnia   . Migraine headache   . Movement disorder    resltess in left legs  . Postmenopausal   . 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 Premature atrial contractions    a. Holter (12/15) with 8% PACs, no atrial runs or atrial fibrillation.     Past Surgical History:  Procedure Laterality Date  . BREAST BIOPSY  01/01/2011   Procedure: BREAST BIOPSY WITH NEEDLE LOCALIZATION;  Surgeon: Rolm Bookbinder, MD;  Location: [facility];  Service: General;  Laterality: Left;  left breast wire localization biopsy  . BREAST BIOPSY Right 07/27/2012  . BREAST BIOPSY Right 07/07/2016  . BREAST BIOPSY Left 06/30/2016  . cataracts right eye    . CESAREAN SECTION  1986  . HERNIA REPAIR  2000   RIH  . opirectinal membrane peel    . PEG PLACEMENT    . RHINOPLASTY  1983  . TRACHEOSTOMY      There were no vitals filed for this visit.      Subjective Assessment - 07/15/16 1406    Subjective  Pt smiling today and attempting to verbalize  - much brighter affect today   Patient is accompained by: Family member   Pertinent History see epic snapshot - ABI/encephalitis/hypoxia from virus; recent hospitalization due to ARF;    Patient Stated Goals Pt unable to state.    Currently in Pain? Yes   Pain Score 3    Pain Location Back   Pain Orientation Lower   Pain Descriptors / Indicators Aching   Pain Type Acute pain   Pain Onset In the past 7 days   Pain Frequency Intermittent   Aggravating Factors  sitting wheelchair                      OT Treatments/Exercises (OP) - 07/15/16 0001      Neurological Re-education Exercises   Other Exercises 1 Neuro re ed to address bilateral UE functional use for low reach and bilateral tasks using folding laundry on dark back ground.  Also addresesd visual perceptual skills and depth perception with reach - pt appears far more impaired in R lower field with depth perception than in mid line or left fields.  Also adressed dynamic sitting balance and low unilateral reach to the right with mod facilitation for reaching and using RUE functionally.  Transitioned into sit to stand, static and  dynamic standing and stepping with emphasis on postural alignment and postural orientation.                    OT Short Term Goals - 07/15/16 1502      OT SHORT TERM GOAL #1   Title Pt, husband and cargiver will be mod I with HEP - 03/29/2016   Status Achieved     OT SHORT TERM GOAL #2   Title Pt will be able to sit with supervision without UE support and vc's to assist with midline 75% while engage in functional task   Status Achieved     OT SHORT TERM GOAL #3   Title Pt will be able to complete sit to stand consistently with mod A for toilet transfers (max a for pivot)   Status Achieved     OT SHORT TERM GOAL #4   Title Pt, husband and caregiver will verbalize understanding of various bathroom accomodations to reduce long term caregiver burden   Status Achieved     OT SHORT  TERM GOAL #5   Title Pt will be able to stand step transfer to toilet with mo more than mod a, mod vc's consistently using grab bar for community toileting needs- 06/10/2016 date adjusted as pt missed 2 weeks due to hospitalization   Status Achieved     OT SHORT TERM GOAL #6   Title Pt will be able to wash face with supervision/max cues 50% of the time, min a 50% of the time.    Status Achieved     OT SHORT TERM GOAL #7   Title Pt will be able to don pull over shirt with mod a, max cues   Status Achieved     OT SHORT TERM GOAL #8   Title Pt will be able to maintain static standing with UE support with close supervision to reduce burden of care for caregivers    Status Achieved     OT SHORT TERM GOAL  #9   TITLE Pt and family will be mod I with upgraded home activities program - 08/09/2016   Status On-going     OT SHORT TERM GOAL  #10   TITLE Pt will be min a stand step toilet transfers at least 50% of the time using either grab bar or walker for community transfers   Status On-going     OT SHORT TERM GOAL  #11   TITLE Pt will consistently move from sit to stand with no more than min a for functional mobility   Status On-going     OT SHORT TERM GOAL  #12   TITLE Pt will be no more than mod a consistently for bathing at shower level   Status On-going           OT Long Term Goals - 07/15/16 1502      [place] #1   Title Pt, husband and caregiver will be mod I with upgraded HEP prn - 04/26/2016   Status Achieved     OT LONG TERM GOAL #2   Title Pt, husband and caregiver will be mod I with splint wear and care    Status Achieved     OT LONG TERM GOAL #3   Title Pt will be able to wash face with min a only   Status Achieved     OT LONG TERM GOAL #4   Title Pt will be able to sit without UE support with supevision 100%  of the time with no more than 2 vc's while engaged in functional activity.   Status Achieved     OT LONG TERM GOAL #5   Title Pt, husband and  caregiver will verbalize understanding of potential after care therapeutic opportunities. - 07/08/2016 date adjusted as pt missed 2 weeks due to hospitalization   Status Achieved     OT LONG TERM GOAL #6   Title Pt will be supervision to wash face consistently   Status Achieved     OT LONG TERM GOAL #7   Title Pt will don shirt with min a and max vc's 50% of the time   Status Achieved     OT LONG TERM GOAL #8   Title Pt will require min a for stand step transfers to toilet using grab bar or walker for community toileting at least 75% of the time. - 09/06/2016   Status On-going     OT LONG TERM GOAL  #9   Baseline Pt will stand with contact gurard with only 1 UE support for static standing balance in prep for abiilty to engage in functional task while standing.    Status Achieved     OT LONG TERM GOAL  #10   TITLE Pt will consistenly move from sit to stand with no more than CGA while caregiver assists with pulling up pants.    Status On-going     OT LONG TERM GOAL  #11   TITLE Pt will require no more than close supervision for static standing with bilateral UE while caregiver assists with hiking pants.    Status On-going     OT LONG TERM GOAL  #12   TITLE Pt will go from stand to sit during ADL activities with no more than min a.    Status On-going               Plan - 07/15/16 1502    Clinical Impression Statement Pt with improved affect today and more particpatory than last two sessions.  Pt improving slowly with postural control and functional mobility   Rehab Potential Fair   Current Impairments/barriers affecting progress: severity of deficits, very slow rate of recovery; pt recently diagnosed with breast cancer - will monitor ability to tolerate therapy   OT Frequency 2x / week   OT Duration 8 weeks   OT Treatment/Interventions Self-care/ADL training;Ultrasound;Moist Heat;Electrical Stimulation;DME and/or AE instruction;Neuromuscular education;Therapeutic  exercise;Functional Mobility Training;Manual Therapy;Passive range of motion;Splinting;Therapeutic activities;Balance training;Patient/family education;Visual/perceptual remediation/compensation;Cognitive remediation/compensation   Plan NMR for trunk, UE's, functional mobility in context of functional tasks.   Consulted and Agree with Plan of Care Patient;Family member/caregiver   Family Member Consulted caregiver [name] , husband [name]      Patient will benefit from skilled therapeutic intervention in order to improve the following deficits and impairments:  Decreased endurance, Decreased skin integrity, Impaired vision/preception, Improper body mechanics, Impaired perceived functional ability, Decreased knowledge of precautions, Cardiopulmonary status limiting activity, Decreased activity tolerance, Decreased knowledge of use of DME, Decreased strength, Impaired flexibility, Improper spinal/pelvic alignment, Impaired sensation, Difficulty walking, Decreased mobility, Decreased balance, Decreased cognition, Decreased range of motion, Impaired tone, Impaired UE functional use, Decreased safety awareness, Decreased coordination, Pain  Visit Diagnosis: Unsteadiness on feet  Muscle weakness (generalized)  Abnormal posture  Stiffness of right shoulder, not elsewhere classified  Other symptoms and signs involving the nervous system  Stiffness of left shoulder, not elsewhere classified  Stiffness of left elbow, not elsewhere classified  Frontal lobe and executive function  deficit    Problem List Patient Active Problem List   Diagnosis Date Noted  . Malignant neoplasm of upper-inner quadrant of left breast in female, estrogen receptor positive ([place]) 07/14/2016  . Heterotopic ossification of bone 03/10/2016  . Acute on chronic respiratory failure with hypoxia ([place])   . Acute hypoxemic respiratory failure ([place])   . Acute blood loss anemia   . Atelectasis 02/04/2016  . Debility 02/03/2016   . Pneumonia of both lower lobes due to Pseudomonas species ([place])   . Pneumonia of both lower lobes due to methicillin susceptible Staphylococcus aureus (MSSA) ([place])   . Acute respiratory failure with hypoxia ([place])   . Acute encephalopathy   . Seizures ([place])   . Sepsis ([place])   . Hypoxia   . Pain   . HCAP (healthcare-associated pneumonia) 12/28/2015  . Chronic respiratory failure ([place]) 12/28/2015  . Acute tracheobronchitis 12/24/2015  . Tracheostomy tube present ([place])   . Tracheal stenosis   . Chronic respiratory failure with hypoxia ([place])   . Spastic tetraplegia ([place]) 10/20/2015  . Tracheostomy status ([place]) 09/26/2015  . Allergic rhinitis 09/26/2015  . 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 Movement disorder 09/22/2015  . Encephalitis 09/22/2015  . Chest pain 02/07/2014  . Premature atrial contractions 01/13/2014  . Abnormality of gait 12/04/2013  . Hyperlipidemia 12/21/2011  . Cardiovascular risk factor 12/21/2011  . Bunion 01/31/2008  . Metatarsalgia of both feet 09/20/2007  . FLAT FOOT 09/20/2007  . HALLUX RIGIDUS, ACQUIRED 09/20/2007    Quay Burow, OTR/L 07/15/2016, 3:07 PM  [facility] [address] [place], [place], [address] Phone: [phone]   Fax:  [phone]  Name: Judy Spears MRN: [id number] Date of Birth: [birth year]

## 2016-07-16 ENCOUNTER — Ambulatory Visit: Payer: Medicare Other | Admitting: Oncology

## 2016-07-16 ENCOUNTER — Other Ambulatory Visit: Payer: Self-pay

## 2016-07-16 ENCOUNTER — Other Ambulatory Visit: Payer: Self-pay | Admitting: Physical Medicine & Rehabilitation

## 2016-07-19 ENCOUNTER — Telehealth: Payer: Self-pay | Admitting: *Deleted

## 2016-07-19 ENCOUNTER — Ambulatory Visit: Payer: Medicare Other | Admitting: Rehabilitation

## 2016-07-19 ENCOUNTER — Encounter: Payer: Self-pay | Admitting: Rehabilitation

## 2016-07-19 ENCOUNTER — Encounter: Payer: Self-pay | Admitting: Occupational Therapy

## 2016-07-19 ENCOUNTER — Ambulatory Visit: Payer: Medicare Other | Admitting: Occupational Therapy

## 2016-07-19 DIAGNOSIS — R29818 Other symptoms and signs involving the nervous system: Secondary | ICD-10-CM

## 2016-07-19 DIAGNOSIS — R293 Abnormal posture: Secondary | ICD-10-CM

## 2016-07-19 DIAGNOSIS — M25612 Stiffness of left shoulder, not elsewhere classified: Secondary | ICD-10-CM

## 2016-07-19 DIAGNOSIS — R2681 Unsteadiness on feet: Secondary | ICD-10-CM

## 2016-07-19 DIAGNOSIS — M6281 Muscle weakness (generalized): Secondary | ICD-10-CM

## 2016-07-19 DIAGNOSIS — M25611 Stiffness of right shoulder, not elsewhere classified: Secondary | ICD-10-CM

## 2016-07-19 DIAGNOSIS — M25622 Stiffness of left elbow, not elsewhere classified: Secondary | ICD-10-CM

## 2016-07-19 DIAGNOSIS — R41844 Frontal lobe and executive function deficit: Secondary | ICD-10-CM

## 2016-07-19 DIAGNOSIS — R471 Dysarthria and anarthria: Secondary | ICD-10-CM | POA: Diagnosis not present

## 2016-07-19 NOTE — Telephone Encounter (Signed)
Left detailed msg for pt husband [name] Ozbun to return call to assess needs. Contact information provided.

## 2016-07-19 NOTE — Therapy (Signed)
[facility] [address] [place], [place], [address] Phone: [phone]   Fax:  [phone]  Occupational Therapy Treatment  Patient Details  Name: Judy Spears MRN: [id number] Date of Birth: 09-11-1950 Referring Provider: Dr. Quita Skye. [name]  Encounter Date: 07/19/2016      OT End of Session - 07/19/16 1652    Visit Number 37   Number of Visits 51   Date for OT Re-Evaluation 09/07/16   Authorization Type BCBS unlimited visits   OT Start Time 1402   OT Stop Time 1444   OT Time Calculation (min) 42 min   Activity Tolerance Patient tolerated treatment well      Past Medical History:  Diagnosis Date  . Arthritis    lt great toe  . Complication of anesthesia    pt has had headaches post op-did advise to hydra well preop  . Hair loss    right-sided  . History of exercise stress test    a. ETT (12/15) with 12:00 exercise, no ST changes, occasional PACs.   . Hyperlipidemia   . Insomnia   . Migraine headache   . Movement disorder    resltess in left legs  . Postmenopausal   . 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 Premature atrial contractions    a. Holter (12/15) with 8% PACs, no atrial runs or atrial fibrillation.     Past Surgical History:  Procedure Laterality Date  . BREAST BIOPSY  01/01/2011   Procedure: BREAST BIOPSY WITH NEEDLE LOCALIZATION;  Surgeon: Rolm Bookbinder, MD;  Location: [facility];  Service: General;  Laterality: Left;  left breast wire localization biopsy  . BREAST BIOPSY Right 07/27/2012  . BREAST BIOPSY Right 07/07/2016  . BREAST BIOPSY Left 06/30/2016  . cataracts right eye    . CESAREAN SECTION  1986  . HERNIA REPAIR  2000   RIH  . opirectinal membrane peel    . PEG PLACEMENT    . RHINOPLASTY  1983  . TRACHEOSTOMY      There were no vitals filed for this visit.      Subjective Assessment - 07/19/16 1412    Subjective  Pt verbalized fine out loud when asked how  she was   Patient is accompained by: Family member  husband [name] and caregiver Leesa   Pertinent History see epic snapshot - ABI/encephalitis/hypoxia from virus; recent hospitalization due to ARF;    Patient Stated Goals Pt unable to state.    Currently in Pain? No/denies                      OT Treatments/Exercises (OP) - 07/19/16 0001      Neurological Re-education Exercises   Other Exercises 1 Neuro re ed to address static and dynamic standing with standard walker with emphasis on postural alignment and control, finding midline, controlling static standing balance in standing with UE with walker in prep for doing this with ADL's.  Discussed this with caregiver and she will work on this at home with pt during hiking pants. Also addressed weight shfiting and functional ambulation with erect trunk with RW. Pt required heavy min a and max cues.                   OT Short Term Goals - 07/19/16 1651      OT SHORT TERM GOAL #1   Title Pt, husband and cargiver will be mod I with HEP - 03/29/2016   Status  Achieved     OT SHORT TERM GOAL #2   Title Pt will be able to sit with supervision without UE support and vc's to assist with midline 75% while engage in functional task   Status Achieved     OT SHORT TERM GOAL #3   Title Pt will be able to complete sit to stand consistently with mod A for toilet transfers (max a for pivot)   Status Achieved     OT SHORT TERM GOAL #4   Title Pt, husband and caregiver will verbalize understanding of various bathroom accomodations to reduce long term caregiver burden   Status Achieved     OT SHORT TERM GOAL #5   Title Pt will be able to stand step transfer to toilet with mo more than mod a, mod vc's consistently using grab bar for community toileting needs- 06/10/2016 date adjusted as pt missed 2 weeks due to hospitalization   Status Achieved     OT SHORT TERM GOAL #6   Title Pt will be able to wash face with supervision/max cues  50% of the time, min a 50% of the time.    Status Achieved     OT SHORT TERM GOAL #7   Title Pt will be able to don pull over shirt with mod a, max cues   Status Achieved     OT SHORT TERM GOAL #8   Title Pt will be able to maintain static standing with UE support with close supervision to reduce burden of care for caregivers    Status Achieved     OT SHORT TERM GOAL  #9   TITLE Pt and family will be mod I with upgraded home activities program - 08/09/2016   Status On-going     OT SHORT TERM GOAL  #10   TITLE Pt will be min a stand step toilet transfers at least 50% of the time using either grab bar or walker for community transfers   Status On-going     OT SHORT TERM GOAL  #11   TITLE Pt will consistently move from sit to stand with no more than min a for functional mobility   Status On-going     OT SHORT TERM GOAL  #12   TITLE Pt will be no more than mod a consistently for bathing at shower level   Status On-going           OT Long Term Goals - 07/19/16 1651      OT LONG TERM GOAL #1   Title Pt, husband and caregiver will be mod I with upgraded HEP prn - 04/26/2016   Status Achieved     OT LONG TERM GOAL #2   Title Pt, husband and caregiver will be mod I with splint wear and care    Status Achieved     OT LONG TERM GOAL #3   Title Pt will be able to wash face with min a only   Status Achieved     OT LONG TERM GOAL #4   Title Pt will be able to sit without UE support with supevision 100% of the time with no more than 2 vc's while engaged in functional activity.   Status Achieved     OT LONG TERM GOAL #5   Title Pt, husband and caregiver will verbalize understanding of potential after care therapeutic opportunities. - 07/08/2016 date adjusted as pt missed 2 weeks due to hospitalization   Status Achieved     OT LONG TERM  GOAL #6   Title Pt will be supervision to wash face consistently   Status Achieved     OT LONG TERM GOAL #7   Title Pt will don shirt with min a  and max vc's 50% of the time   Status Achieved     OT LONG TERM GOAL #8   Title Pt will require min a for stand step transfers to toilet using grab bar or walker for community toileting at least 75% of the time. - 09/06/2016   Status On-going     OT LONG TERM GOAL  #9   Baseline Pt will stand with contact gurard with only 1 UE support for static standing balance in prep for abiilty to engage in functional task while standing.    Status Achieved     OT LONG TERM GOAL  #10   TITLE Pt will consistenly move from sit to stand with no more than CGA while caregiver assists with pulling up pants.    Status On-going     OT LONG TERM GOAL  #11   TITLE Pt will require no more than close supervision for static standing with bilateral UE while caregiver assists with hiking pants.    Status On-going     OT LONG TERM GOAL  #12   TITLE Pt will go from stand to sit during ADL activities with no more than min a.    Status On-going               Plan - 07/19/16 1651    Clinical Impression Statement Pt continues with very slow but steady progress with postural orientation and control with UE in standing.    Rehab Potential Fair   Current Impairments/barriers affecting progress: severity of deficits, very slow rate of recovery; pt recently diagnosed with breast cancer - will monitor ability to tolerate therapy   OT Frequency 2x / week   OT Duration 8 weeks   OT Treatment/Interventions Self-care/ADL training;Ultrasound;Moist Heat;Electrical Stimulation;DME and/or AE instruction;Neuromuscular education;Therapeutic exercise;Functional Mobility Training;Manual Therapy;Passive range of motion;Splinting;Therapeutic activities;Balance training;Patient/family education;Visual/perceptual remediation/compensation;Cognitive remediation/compensation   Plan NMR for trunk, UE's, functional mobility in context of functional tasks, balance.    Consulted and Agree with Plan of Care Patient;Family member/caregiver    Family Member Consulted caregiver [name] , husband [name]      Patient will benefit from skilled therapeutic intervention in order to improve the following deficits and impairments:  Decreased endurance, Decreased skin integrity, Impaired vision/preception, Improper body mechanics, Impaired perceived functional ability, Decreased knowledge of precautions, Cardiopulmonary status limiting activity, Decreased activity tolerance, Decreased knowledge of use of DME, Decreased strength, Impaired flexibility, Improper spinal/pelvic alignment, Impaired sensation, Difficulty walking, Decreased mobility, Decreased balance, Decreased cognition, Decreased range of motion, Impaired tone, Impaired UE functional use, Decreased safety awareness, Decreased coordination, Pain  Visit Diagnosis: Unsteadiness on feet  Muscle weakness (generalized)  Abnormal posture  Other symptoms and signs involving the nervous system  Stiffness of left shoulder, not elsewhere classified  Stiffness of right shoulder, not elsewhere classified  Stiffness of left elbow, not elsewhere classified  Frontal lobe and executive function deficit    Problem List Patient Active Problem List   Diagnosis Date Noted  . Malignant neoplasm of upper-inner quadrant of left breast in female, estrogen receptor positive ([place]) 07/14/2016  . Heterotopic ossification of bone 03/10/2016  . Acute on chronic respiratory failure with hypoxia ([place])   . Acute hypoxemic respiratory failure ([place])   . Acute blood loss anemia   . Atelectasis  02/04/2016  . Debility 02/03/2016  . Pneumonia of both lower lobes due to Pseudomonas species ([place])   . Pneumonia of both lower lobes due to methicillin susceptible Staphylococcus aureus (MSSA) ([place])   . Acute respiratory failure with hypoxia ([place])   . Acute encephalopathy   . Seizures ([place])   . Sepsis ([place])   . Hypoxia   . Pain   . HCAP (healthcare-associated pneumonia) 12/28/2015  . Chronic respiratory  failure ([place]) 12/28/2015  . Acute tracheobronchitis 12/24/2015  . Tracheostomy tube present ([place])   . Tracheal stenosis   . Chronic respiratory failure with hypoxia ([place])   . Spastic tetraplegia ([place]) 10/20/2015  . Tracheostomy status ([place]) 09/26/2015  . Allergic rhinitis 09/26/2015  . 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 Movement disorder 09/22/2015  . Encephalitis 09/22/2015  . Chest pain 02/07/2014  . Premature atrial contractions 01/13/2014  . Abnormality of gait 12/04/2013  . Hyperlipidemia 12/21/2011  . Cardiovascular risk factor 12/21/2011  . Bunion 01/31/2008  . Metatarsalgia of both feet 09/20/2007  . FLAT FOOT 09/20/2007  . HALLUX RIGIDUS, ACQUIRED 09/20/2007    Quay Burow, OTR/L 07/19/2016, 4:54 PM  [facility] [address] [place], [place], [address] Phone: [phone]   Fax:  [phone]  Name: Judy Spears MRN: [id number] Date of Birth: 04/26/1950

## 2016-07-19 NOTE — Telephone Encounter (Signed)
Spoke with pt husband Pharmacologist. Informed me that Judy Spears has decided to have sx at [facility]. Informed husband that if they would like to come back to [facility] at any time to please call me. Received verbal understanding. Contact information provided.

## 2016-07-19 NOTE — Therapy (Signed)
[facility] [address] [place], [place], [address] Phone: [phone]   Fax:  [phone]  Physical Therapy Treatment  Patient Details  Name: Judy Spears MRN: [id number] Date of Birth: 05-Oct-1950 Referring Provider: Alger Simons, MD/Andrew Letta Pate, MD  Encounter Date: 07/19/2016      PT End of Session - 07/19/16 1615    Visit Number 37   Number of Visits 33   Date for PT Re-Evaluation 08/23/16   Authorization Type BCBS   PT Start Time 1322   PT Stop Time 1400   PT Time Calculation (min) 38 min   Activity Tolerance Patient tolerated treatment well   Behavior During Therapy [facility] for tasks assessed/performed      Past Medical History:  Diagnosis Date  . Arthritis    lt great toe  . Complication of anesthesia    pt has had headaches post op-did advise to hydra well preop  . Hair loss    right-sided  . History of exercise stress test    a. ETT (12/15) with 12:00 exercise, no ST changes, occasional PACs.   . Hyperlipidemia   . Insomnia   . Migraine headache   . Movement disorder    resltess in left legs  . Postmenopausal   . 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 Premature atrial contractions    a. Holter (12/15) with 8% PACs, no atrial runs or atrial fibrillation.     Past Surgical History:  Procedure Laterality Date  . BREAST BIOPSY  01/01/2011   Procedure: BREAST BIOPSY WITH NEEDLE LOCALIZATION;  Surgeon: Rolm Bookbinder, MD;  Location: [facility];  Service: General;  Laterality: Left;  left breast wire localization biopsy  . BREAST BIOPSY Right 07/27/2012  . BREAST BIOPSY Right 07/07/2016  . BREAST BIOPSY Left 06/30/2016  . cataracts right eye    . CESAREAN SECTION  1986  . HERNIA REPAIR  2000   RIH  . opirectinal membrane peel    . PEG PLACEMENT    . RHINOPLASTY  1983  . TRACHEOSTOMY      There were no vitals filed for this visit.      Subjective Assessment - 07/19/16 1614     Subjective Family/caregiver reports no changes, have been working on walking at home with husband and nurse over the weekend.    Patient is accompained by: Family member   Pertinent History Precautions:  PEG.  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  Another round of botox in LLE on 02/06/16   Limitations House hold activities;Walking;Standing;Sitting   Patient Stated Goals Per husband, "For [name] to be as independent as possible."   Currently in Pain? No/denies               NMR:  Side stepping in // bars with emphasis on finding midline posture, increasing trunk extension and working to adequately weight shift prior to stepping.  Performed single lap down and back with min A for facilitation at trunk as needed for posture and also at pelvis for improved weight shift.  Caregiver and husband present during session to assist with verbal cuing as well.  Pt maintaining standing during rest at end of // bars before performing another lap down and back with squats to encourage posterior weight shift with sitting and forward trunk lean.  Again, min A  as above, but did very well with squats and needed no assist to perform appropriately.  Ended session with gait x 29' with BPFRW.  Note pt needed mod A today with heavier facilitation for posture and improved hip protraction as well as weight shift.  Note difficulty clearing BLEs today due to forward flexed posture.                    PT Education - 07/19/16 1615    Education provided Yes   Education Details beginning side stepping at home to work on postural control and improved weight shifting   Person(s) Educated Patient;Spouse;Caregiver(s)   Methods Explanation;Demonstration   Comprehension Verbalized understanding;Returned demonstration           PT Short Term Goals - 06/25/16 1205      PT SHORT TERM GOAL #1   Title Jay Back on pt's current manual wheelchair to be switched out to regular back; pt will demonstrate abilty to sit in wheelchair with upright with less supportive wheelchair back.  07-24-16   Baseline Jay Back on current wheelchair   Time 4   Period Weeks   Status New     PT SHORT TERM GOAL #2   Title Pt will perform sit to stand from wheelchair to RW with CGA.  07-24-16   Baseline min to mod assist needed with cues for positioning of feet and to lean forward - 06-24-16   Time 4   Period Weeks   Status New     PT SHORT TERM GOAL #3   Title Family to obtain bil. platform RW to begin assisting pt with ambulation in her home environment.  07-24-16   Baseline have been assessing ability to use bil. platform RW vs. UP walker   Time 4   Period Weeks   Status New     PT SHORT TERM GOAL #4   Title Family will report pt rolling from supine to Rt and Lt sides in hospital bed at home with supervision without use of bed rail with cues for positioning of LE's.  07-24-16   Baseline pt needs min assist at times - activity just demonstrated by pt on 06-24-16   Time 4   Period Weeks   Status New     PT SHORT TERM GOAL #5   Title Basic transfers from wheelchair to bed or commode with CGA.  07-24-16   Baseline min to mod assist needed - 06-24-16   Time 4   Period Weeks     PT SHORT TERM GOAL #6   Title Amb. 60' with bil. platform RW with CGA to begin amb. at home with assistance safely.  07-24-16   Baseline 115' with platform RW with min to mod assist   Time 4   Period Weeks   Status New           PT Long Term Goals - 06/25/16 1142      PT LONG TERM GOAL #1   Title Pt will perform supine rolling R and L at mod I level in order to indicate improved independence with bed mobility.  (Target Date: 06/25/16)/ 08-23-16  CONT. LTG til 08-23-16   Baseline inconsistent in performance - pt needs cues for LE flexion to assist with  pushing for rolling and cues for UE positioning   Time 8   Period Weeks   Status On-going     PT LONG TERM GOAL #2   Title Pt will perform sit>SL R or  L modified independently in order to indicate improved independence with getting into bed.   08-23-16   Baseline min to mod assist needed - 06-24-16   Time 8   Status New     PT LONG TERM GOAL #3   Title Pt will transfer Lt/Rt sidelying to sitting with CGA for incr. independence with getting out of bed.  08-23-16   Baseline min to mod assist needed -06-24-16   Time 8   Period Weeks   Status New     PT LONG TERM GOAL #4   Title Pt will perform basic transfers with CGA to indicate improved functional independence.  08-23-16   Baseline min assist needed - 06-24-16   Time 8   Period Weeks   Status New     PT LONG TERM GOAL #5   Title Pt will perform static standing with bil. UE support with distant supervision for >/= 10" for improved standing balance.  08-23-16   Baseline close SBA to CGA needed - pt standing with bil UE support for 8-10" per caregiver's report -- 06-24-16   Time 8   Period Weeks   Status New     Additional Long Term Goals   Additional Long Term Goals Yes     PT LONG TERM GOAL #6   Title Pt will demonstrate ability to ambulate up to 25' w/ LRAD at min A level in order to assist with getting around home.  CONT LTG til 08-23-16; goal upgraded to 50'   Baseline bil. platform RW to be used in home   Time 8   Period Weeks   Status On-going     PT LONG TERM GOAL #7   Title Perform sit to stand and stand to sit to RW or to another object with CGA.  08-23-16   Baseline min assist needed - 06-24-16   Time 8   Period Weeks   Status New               Plan - 07/19/16 1616    Clinical Impression Statement Skilled session in // bars working on side stepping and side stepping w/ squats to improve postural control and lateral weight shifting to carryover to improved gait.   Also worked on gait with B PFRW.  Note that she  needed more assist today at trunk to maintain upright posture and trunk extension.  Also needs assist to move walker esp around turns.     Rehab Potential Fair   Clinical Impairments Affecting Rehab Potential Chronicity of condition, continued medical management, but has very strong family and financial support   PT Frequency 2x / week   PT Duration 8 weeks   PT Treatment/Interventions ADLs/Self Care Home Management;DME Instruction;Gait training;Stair training;Functional mobility training;Therapeutic activities;Patient/family education;Cognitive remediation;Neuromuscular re-education;Therapeutic exercise;Balance training;Orthotic Fit/Training;Wheelchair mobility training;Manual techniques;Splinting;Visual/perceptual remediation/compensation;Vestibular;Passive range of motion;Electrical Stimulation   PT Next Visit Plan cont gait, balance, transfer, bed mobility training; try technique for upright posture in standing (if pt able to tolerate); STG's due to be assessed by 07-24-16   Consulted and Agree with Plan of Care Patient;Family member/caregiver   Family Member Consulted caregiver, [name] and husband [name]      Patient will benefit from skilled therapeutic intervention in order to improve the following deficits and impairments:  Abnormal gait, Decreased balance, Decreased endurance, Decreased cognition, Cardiopulmonary status limiting activity, Decreased activity tolerance, Decreased coordination, Decreased strength, Impaired flexibility, Impaired tone, Decreased mobility, Increased muscle spasms, Impaired vision/preception, Decreased range of motion, Impaired UE functional  use, Postural dysfunction, Improper body mechanics, Impaired perceived functional ability, Decreased knowledge of precautions, Decreased knowledge of use of DME  Visit Diagnosis: Unsteadiness on feet  Muscle weakness (generalized)  Abnormal posture  Other symptoms and signs involving the nervous system     Problem  List Patient Active Problem List   Diagnosis Date Noted  . Malignant neoplasm of upper-inner quadrant of left breast in female, estrogen receptor positive ([place]) 07/14/2016  . Heterotopic ossification of bone 03/10/2016  . Acute on chronic respiratory failure with hypoxia ([place])   . Acute hypoxemic respiratory failure ([place])   . Acute blood loss anemia   . Atelectasis 02/04/2016  . Debility 02/03/2016  . Pneumonia of both lower lobes due to Pseudomonas species ([place])   . Pneumonia of both lower lobes due to methicillin susceptible Staphylococcus aureus (MSSA) ([place])   . Acute respiratory failure with hypoxia ([place])   . Acute encephalopathy   . Seizures ([place])   . Sepsis ([place])   . Hypoxia   . Pain   . HCAP (healthcare-associated pneumonia) 12/28/2015  . Chronic respiratory failure ([place]) 12/28/2015  . Acute tracheobronchitis 12/24/2015  . Tracheostomy tube present ([place])   . Tracheal stenosis   . Chronic respiratory failure with hypoxia ([name])   . Spastic tetraplegia ([place]) 10/20/2015  . Tracheostomy status ([place]) 09/26/2015  . Allergic rhinitis 09/26/2015  . 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 Movement disorder 09/22/2015  . Encephalitis 09/22/2015  . Chest pain 02/07/2014  . Premature atrial contractions 01/13/2014  . Abnormality of gait 12/04/2013  . Hyperlipidemia 12/21/2011  . Cardiovascular risk factor 12/21/2011  . Bunion 01/31/2008  . Metatarsalgia of both feet 09/20/2007  . FLAT FOOT 09/20/2007  . HALLUX RIGIDUS, ACQUIRED 09/20/2007    Cameron Sprang, PT, MPT [facility] [address] [place], [place], [address] Phone: [phone]   Fax:  [phone] 07/19/16, 4:26 PM  Name: GENIYAH EISCHEID MRN: [id number] Date of Birth: Apr 21, 1950

## 2016-07-22 ENCOUNTER — Encounter: Payer: Self-pay | Admitting: Occupational Therapy

## 2016-07-22 ENCOUNTER — Ambulatory Visit: Payer: Medicare Other | Admitting: Occupational Therapy

## 2016-07-22 ENCOUNTER — Encounter: Payer: Self-pay | Admitting: Rehabilitation

## 2016-07-22 ENCOUNTER — Ambulatory Visit: Payer: Medicare Other | Admitting: Rehabilitation

## 2016-07-22 DIAGNOSIS — R29818 Other symptoms and signs involving the nervous system: Secondary | ICD-10-CM

## 2016-07-22 DIAGNOSIS — M6281 Muscle weakness (generalized): Secondary | ICD-10-CM

## 2016-07-22 DIAGNOSIS — M25622 Stiffness of left elbow, not elsewhere classified: Secondary | ICD-10-CM

## 2016-07-22 DIAGNOSIS — R2681 Unsteadiness on feet: Secondary | ICD-10-CM

## 2016-07-22 DIAGNOSIS — R41844 Frontal lobe and executive function deficit: Secondary | ICD-10-CM

## 2016-07-22 DIAGNOSIS — R293 Abnormal posture: Secondary | ICD-10-CM

## 2016-07-22 DIAGNOSIS — M25612 Stiffness of left shoulder, not elsewhere classified: Secondary | ICD-10-CM

## 2016-07-22 DIAGNOSIS — R471 Dysarthria and anarthria: Secondary | ICD-10-CM | POA: Diagnosis not present

## 2016-07-22 DIAGNOSIS — M25611 Stiffness of right shoulder, not elsewhere classified: Secondary | ICD-10-CM

## 2016-07-22 NOTE — Therapy (Signed)
[facility] [address] [place], [place], [address] Phone: [phone]   Fax:  [phone]  Occupational Therapy Treatment  Patient Details  Name: Judy Spears MRN: [id number] Date of Birth: April 15, 1950 Referring Provider: Dr. Quita Skye. [name]  Encounter Date: 07/22/2016      OT End of Session - 07/22/16 1600    Visit Number 40   Number of Visits 50   Date for OT Re-Evaluation 09/07/16   Authorization Type BCBS unlimited visits   OT Start Time 1401   OT Stop Time 1448   OT Time Calculation (min) 47 min   Activity Tolerance Patient tolerated treatment well      Past Medical History:  Diagnosis Date  . Arthritis    lt great toe  . Complication of anesthesia    pt has had headaches post op-did advise to hydra well preop  . Hair loss    right-sided  . History of exercise stress test    a. ETT (12/15) with 12:00 exercise, no ST changes, occasional PACs.   . Hyperlipidemia   . Insomnia   . Migraine headache   . Movement disorder    resltess in left legs  . Postmenopausal   . 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 Premature atrial contractions    a. Holter (12/15) with 8% PACs, no atrial runs or atrial fibrillation.     Past Surgical History:  Procedure Laterality Date  . BREAST BIOPSY  01/01/2011   Procedure: BREAST BIOPSY WITH NEEDLE LOCALIZATION;  Surgeon: Rolm Bookbinder, MD;  Location: [facility];  Service: General;  Laterality: Left;  left breast wire localization biopsy  . BREAST BIOPSY Right 07/27/2012  . BREAST BIOPSY Right 07/07/2016  . BREAST BIOPSY Left 06/30/2016  . cataracts right eye    . CESAREAN SECTION  1986  . HERNIA REPAIR  2000   RIH  . opirectinal membrane peel    . PEG PLACEMENT    . RHINOPLASTY  1983  . TRACHEOSTOMY      There were no vitals filed for this visit.      Subjective Assessment - 07/22/16 1550    Subjective  Pt smiled and said "fine" when asked how she  was today   Patient is accompained by: Family member  husband Judy Spears, caregiver Judy Spears   Pertinent History see epic snapshot - ABI/encephalitis/hypoxia from virus; recent hospitalization due to ARF;    Patient Stated Goals Pt unable to state.    Currently in Pain? No/denies                      OT Treatments/Exercises (OP) - 07/22/16 0001      Neurological Re-education Exercises   Other Exercises 1 Neuro re ed to address sit to stand, static and dynamic standing balance with standard RW, weight shifting, trunk control, postural orientation, postural alignment and postural control. Pt continues with slow but steady improvements in these areas. Pt with significant visual disturbance in R lower fields however difficult to assess due to cognition and language deficits.  Pt continues with L head tilt as well as tilting upper trunk to L in what appears to be an abnormal righting reaction.  Pt can bring head to midline for brief periods with cueing however quickly reverts back to abnormal posturing.  Addressed trunk control and postural control in kneeling today.  Pt with improved ability to find midline in terms of A/P orientation but no signficant improvement with L head  tilt.  Head tilt has greatly improved in supported sitting when pt has much more proprioceptive input from chair.                    OT Short Term Goals - 07/22/16 1557      OT SHORT TERM GOAL #1   Title Pt, husband and cargiver will be mod I with HEP - 03/29/2016   Status Achieved     OT SHORT TERM GOAL #2   Title Pt will be able to sit with supervision without UE support and vc's to assist with midline 75% while engage in functional task   Status Achieved     OT SHORT TERM GOAL #3   Title Pt will be able to complete sit to stand consistently with mod A for toilet transfers (max a for pivot)   Status Achieved     OT SHORT TERM GOAL #4   Title Pt, husband and caregiver will verbalize understanding of  various bathroom accomodations to reduce long term caregiver burden   Status Achieved     OT SHORT TERM GOAL #5   Title Pt will be able to stand step transfer to toilet with mo more than mod a, mod vc's consistently using grab bar for community toileting needs- 06/10/2016 date adjusted as pt missed 2 weeks due to hospitalization   Status Achieved     OT SHORT TERM GOAL #6   Title Pt will be able to wash face with supervision/max cues 50% of the time, min a 50% of the time.    Status Achieved     OT SHORT TERM GOAL #7   Title Pt will be able to don pull over shirt with mod a, max cues   Status Achieved     OT SHORT TERM GOAL #8   Title Pt will be able to maintain static standing with UE support with close supervision to reduce burden of care for caregivers    Status Achieved     OT SHORT TERM GOAL  #9   TITLE Pt and family will be mod I with upgraded home activities program - 08/09/2016   Status On-going     OT SHORT TERM GOAL  #10   TITLE Pt will be min a stand step toilet transfers at least 50% of the time using either grab bar or walker for community transfers   Status On-going     OT SHORT TERM GOAL  #11   TITLE Pt will consistently move from sit to stand with no more than min a for functional mobility   Status On-going     OT SHORT TERM GOAL  #12   TITLE Pt will be no more than mod a consistently for bathing at shower level   Status On-going           OT Long Term Goals - 07/22/16 1558      OT LONG TERM GOAL #1   Title Pt, husband and caregiver will be mod I with upgraded HEP prn - 04/26/2016   Status Achieved     OT LONG TERM GOAL #2   Title Pt, husband and caregiver will be mod I with splint wear and care    Status Achieved     OT LONG TERM GOAL #3   Title Pt will be able to wash face with min a only   Status Achieved     OT LONG TERM GOAL #4   Title Pt will be able to  sit without UE support with supevision 100% of the time with no more than 2 vc's while  engaged in functional activity.   Status Achieved     OT LONG TERM GOAL #5   Title Pt, husband and caregiver will verbalize understanding of potential after care therapeutic opportunities. - 07/08/2016 date adjusted as pt missed 2 weeks due to hospitalization   Status Achieved     OT LONG TERM GOAL #6   Title Pt will be supervision to wash face consistently   Status Achieved     OT LONG TERM GOAL #7   Title Pt will don shirt with min a and max vc's 50% of the time   Status Achieved     OT LONG TERM GOAL #8   Title Pt will require min a for stand step transfers to toilet using grab bar or walker for community toileting at least 75% of the time. - 09/06/2016   Status On-going     OT LONG TERM GOAL  #9   Baseline Pt will stand with contact gurard with only 1 UE support for static standing balance in prep for abiilty to engage in functional task while standing.    Status Achieved     OT LONG TERM GOAL  #10   TITLE Pt will consistenly move from sit to stand with no more than CGA while caregiver assists with pulling up pants.    Status On-going     OT LONG TERM GOAL  #11   TITLE Pt will require no more than close supervision for static standing with bilateral UE while caregiver assists with hiking pants.    Status On-going     OT LONG TERM GOAL  #12   TITLE Pt will go from stand to sit during ADL activities with no more than min a.    Status On-going               Plan - 07/22/16 1558    Clinical Impression Statement Pt continues to make very slow but steady improvements in basic mobility and balance.  Pt benefits from significant repetition due to cognitve, motor, sensory and perceptual deficits.    Rehab Potential Fair   Current Impairments/barriers affecting progress: severity of deficits, very slow rate of recovery; pt recently diagnosed with breast cancer - will monitor ability to tolerate therapy   OT Frequency 2x / week   OT Duration 8 weeks   OT  Treatment/Interventions Self-care/ADL training;Ultrasound;Moist Heat;Electrical Stimulation;DME and/or AE instruction;Neuromuscular education;Therapeutic exercise;Functional Mobility Training;Manual Therapy;Passive range of motion;Splinting;Therapeutic activities;Balance training;Patient/family education;Visual/perceptual remediation/compensation;Cognitive remediation/compensation   Plan NMR for trunk, UE's, functional mobility in context of functional tasks, balance, postural orientation/alignment/control in kneeling and standing.   Consulted and Agree with Plan of Care Patient;Family member/caregiver   Family Member Consulted caregiver Judy Spears , husband Judy Spears      Patient will benefit from skilled therapeutic intervention in order to improve the following deficits and impairments:  Decreased endurance, Decreased skin integrity, Impaired vision/preception, Improper body mechanics, Impaired perceived functional ability, Decreased knowledge of precautions, Cardiopulmonary status limiting activity, Decreased activity tolerance, Decreased knowledge of use of DME, Decreased strength, Impaired flexibility, Improper spinal/pelvic alignment, Impaired sensation, Difficulty walking, Decreased mobility, Decreased balance, Decreased cognition, Decreased range of motion, Impaired tone, Impaired UE functional use, Decreased safety awareness, Decreased coordination, Pain  Visit Diagnosis: Unsteadiness on feet  Muscle weakness (generalized)  Abnormal posture  Other symptoms and signs involving the nervous system  Stiffness of left shoulder, not elsewhere classified  Stiffness of right shoulder, not elsewhere classified  Stiffness of left elbow, not elsewhere classified  Frontal lobe and executive function deficit    Problem List Patient Active Problem List   Diagnosis Date Noted  . Malignant neoplasm of upper-inner quadrant of left breast in female, estrogen receptor positive ([place]) 07/14/2016  .  Heterotopic ossification of bone 03/10/2016  . Acute on chronic respiratory failure with hypoxia ([place])   . Acute hypoxemic respiratory failure ([place])   . Acute blood loss anemia   . Atelectasis 02/04/2016  . Debility 02/03/2016  . Pneumonia of both lower lobes due to Pseudomonas species ([place])   . Pneumonia of both lower lobes due to methicillin susceptible Staphylococcus aureus (MSSA) ([place])   . Acute respiratory failure with hypoxia ([place])   . Acute encephalopathy   . Seizures ([place])   . Sepsis ([place])   . Hypoxia   . Pain   . HCAP (healthcare-associated pneumonia) 12/28/2015  . Chronic respiratory failure ([place]) 12/28/2015  . Acute tracheobronchitis 12/24/2015  . Tracheostomy tube present ([place])   . Tracheal stenosis   . Chronic respiratory failure with hypoxia ([place])   . Spastic tetraplegia ([place]) 10/20/2015  . Tracheostomy status ([place]) 09/26/2015  . Allergic rhinitis 09/26/2015  . 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 Movement disorder 09/22/2015  . Encephalitis 09/22/2015  . Chest pain 02/07/2014  . Premature atrial contractions 01/13/2014  . Abnormality of gait 12/04/2013  . Hyperlipidemia 12/21/2011  . Cardiovascular risk factor 12/21/2011  . Bunion 01/31/2008  . Metatarsalgia of both feet 09/20/2007  . FLAT FOOT 09/20/2007  . HALLUX RIGIDUS, ACQUIRED 09/20/2007    Quay Burow , OTR/L 07/22/2016, 4:01 PM  [facility] [address] [place], [place], [address] Phone: [phone]   Fax:  [phone]  Name: Judy Spears MRN: [id number] Date of Birth: 1950-11-16

## 2016-07-22 NOTE — Therapy (Signed)
[facility] [address] [place], [place], [address] Phone: [phone]   Fax:  [phone]  Physical Therapy Treatment  Patient Details  Name: Judy Spears MRN: [id number] Date of Birth: [birth year] Referring Provider: Alger Simons, MD/Andrew Letta Pate, MD  Encounter Date: 07/22/2016      PT End of Session - 07/22/16 1648    Visit Number 38   Number of Visits 24   Date for PT Re-Evaluation 08/23/16   Authorization Type [employer]   PT Start Time 9485   PT Stop Time 1400   PT Time Calculation (min) 43 min   Activity Tolerance Patient tolerated treatment well   Behavior During Therapy [facility] for tasks assessed/performed      Past Medical History:  Diagnosis Date  . Arthritis    lt great toe  . Complication of anesthesia    pt has had headaches post op-did advise to hydra well preop  . Hair loss    right-sided  . History of exercise stress test    a. ETT (12/15) with 12:00 exercise, no ST changes, occasional PACs.   . Hyperlipidemia   . Insomnia   . Migraine headache   . Movement disorder    resltess in left legs  . Postmenopausal   . 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 Premature atrial contractions    a. Holter (12/15) with 8% PACs, no atrial runs or atrial fibrillation.     Past Surgical History:  Procedure Laterality Date  . BREAST BIOPSY  01/01/2011   Procedure: BREAST BIOPSY WITH NEEDLE LOCALIZATION;  Surgeon: Rolm Bookbinder, MD;  Location: [facility];  Service: General;  Laterality: Left;  left breast wire localization biopsy  . BREAST BIOPSY Right 07/27/2012  . BREAST BIOPSY Right 07/07/2016  . BREAST BIOPSY Left 06/30/2016  . cataracts right eye    . CESAREAN SECTION  1986  . HERNIA REPAIR  2000   RIH  . opirectinal membrane peel    . PEG PLACEMENT    . RHINOPLASTY  1983  . TRACHEOSTOMY      There were no vitals filed for this visit.      Subjective Assessment - 07/22/16 1321    Subjective Family/caregiver reports no changes, "walked" friends out to door today.    Patient is accompained by: Family member   Pertinent History Precautions:  PEG.  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  Another round of botox in LLE on 02/06/16   Limitations House hold activities;Walking;Standing;Sitting   Patient Stated Goals Per husband, "For [name] to be as independent as possible."   Currently in Pain? No/denies                         [facility] Adult PT Treatment/Exercise - 07/22/16 0001      Transfers   Transfers Sit to Stand;Stand to Sit;Stand Pivot Transfers   Sit to Stand 4: Min guard   Sit to Stand Details Tactile cues for weight shifting;Tactile cues for posture;Tactile cues for sequencing   Sit to Stand Details (indicate cue type and reason) Pt does very well initiating scooting forward to EOC, needs verbal cues for hand placement and light tactile cues for forward trunk lean and weight shift.     Stand to Sit 4: Min guard   Stand to  Sit Details (indicate cue type and reason) Tactile cues for initiation;Tactile cues for sequencing;Tactile cues for weight shifting;Tactile cues for posture   Stand to Sit Details Tactile cues for hand placement down RW and at head very lightly to increase forward weight shift.     Stand Pivot Transfers 4: Min guard;4: Min Arts development officer Details (indicate cue type and reason) Performed stand pivot transfer today with use of RW.  Step by step cues for sequencing and RW placement, but only requires min A to complete transfer.       Ambulation/Gait   Ambulation/Gait Yes   Ambulation/Gait Assistance 4: Min assist   Ambulation/Gait Assistance Details Pt ambulated x 2 bouts today x 90' x 1 and another 73' x 1.  Did not utilize platforms  today as to better allow pt to figure out posture with less proprioceptive feedback.  She did VERY well and esp did well managing RW on her own.  Provided tactile cues at rib cage for improved forward movement over stance LE, but she also does very well with this when cued for posture and stepping sequence and also utilized mirror for increased visual feedback.  She continues to right her posture with increased head and body tilt to the L, despite cues.  She can correct head posture but not trunk during session.  While walking would intermittently have pt stop and get balance standing with RW to the point that she could stand at S level.  Note that BLE placement under her is crucial to her being able to balance at S level.  Feel that with more practice, family/caregivers can begin to do this at home.     Ambulation Distance (Feet) --  see above   Assistive device Rolling walker   Gait Pattern Decreased stance time - right;Decreased hip/knee flexion - right;Decreased hip/knee flexion - left;Decreased weight shift to right;Scissoring;Ataxic   Ambulation Surface Level;Indoor                PT Education - 07/22/16 1648    Education provided Yes   Education Details working towards gait at home with RW vs PFRW   Person(s) Educated Patient   Methods Explanation   Comprehension Verbalized understanding          PT Short Term Goals - 07/22/16 1322      PT SHORT TERM GOAL #1   Title Jay Back on pt's current manual wheelchair to be switched out to regular back; pt will demonstrate abilty to sit in wheelchair with upright with less supportive wheelchair back.  07-24-16   Baseline Jay Back on current wheelchair   Time 4   Period Weeks   Status Achieved     PT SHORT TERM GOAL #2   Title Pt will perform sit to stand from wheelchair to RW with CGA.  07-24-16   Baseline min/guard to min A 07/22/16   Time 4   Period Weeks   Status Partially Met     PT SHORT TERM GOAL #3   Title Family to  obtain bil. platform RW to begin assisting pt with ambulation in her home environment.  07-24-16   Baseline met with B PFRW 07/22/16   Time 4   Period Weeks   Status Achieved     PT SHORT TERM GOAL #4   Title Family will report pt rolling from supine to Rt and Lt sides in hospital bed at home with supervision without use of bed rail with cues  for positioning of LE's.  07-24-16   Baseline pt needs min assist at times - activity just demonstrated by pt on 06-24-16   Time 4   Period Weeks   Status New     PT SHORT TERM GOAL #5   Title Basic transfers from wheelchair to bed or commode with CGA.  07-24-16   Baseline min A 07/22/16 with RW   Time 4   Period Weeks   Status Partially Met     PT SHORT TERM GOAL #6   Title Amb. 15' with bil. platform RW with CGA to begin amb. at home with assistance safely.  07-24-16   Baseline 115' with BPFRW and now RW with min A 07/22/16   Time 4   Period Weeks   Status Partially Met           PT Long Term Goals - 06/25/16 1142      PT LONG TERM GOAL #1   Title Pt will perform supine rolling R and L at mod I level in order to indicate improved independence with bed mobility.  (Target Date: 06/25/16)/ 08-23-16  CONT. LTG til 08-23-16   Baseline inconsistent in performance - pt needs cues for LE flexion to assist with pushing for rolling and cues for UE positioning   Time 8   Period Weeks   Status On-going     PT LONG TERM GOAL #2   Title Pt will perform sit>SL R or L modified independently in order to indicate improved independence with getting into bed.   08-23-16   Baseline min to mod assist needed - 06-24-16   Time 8   Status New     PT LONG TERM GOAL #3   Title Pt will transfer Lt/Rt sidelying to sitting with CGA for incr. independence with getting out of bed.  08-23-16   Baseline min to mod assist needed -06-24-16   Time 8   Period Weeks   Status New     PT LONG TERM GOAL #4   Title Pt will perform basic transfers with CGA to indicate improved  functional independence.  08-23-16   Baseline min assist needed - 06-24-16   Time 8   Period Weeks   Status New     PT LONG TERM GOAL #5   Title Pt will perform static standing with bil. UE support with distant supervision for >/= 10" for improved standing balance.  08-23-16   Baseline close SBA to CGA needed - pt standing with bil UE support for 8-10" per caregiver's report -- 06-24-16   Time 8   Period Weeks   Status New     Additional Long Term Goals   Additional Long Term Goals Yes     PT LONG TERM GOAL #6   Title Pt will demonstrate ability to ambulate up to 25' w/ LRAD at min A level in order to assist with getting around home.  CONT LTG til 08-23-16; goal upgraded to 50'   Baseline bil. platform RW to be used in home   Time 8   Period Weeks   Status On-going     PT LONG TERM GOAL #7   Title Perform sit to stand and stand to sit to RW or to another object with CGA.  08-23-16   Baseline min assist needed - 06-24-16   Time 8   Period Weeks   Status New               Plan - 07/22/16  1649    Clinical Impression Statement Skilled session focused on gait with use of RW (without platforms) in order to allow pt to better figure out body in space.  She is doing very well with this but needs continued step by step cues throughout.  Pt has met 2/6 LTGs and partially met 3 other goals.  Did not have time to check bed mobility/rollling goal today, but she is progressing very well towards LTGs.     Rehab Potential Fair   Clinical Impairments Affecting Rehab Potential Chronicity of condition, continued medical management, but has very strong family and financial support   PT Frequency 2x / week   PT Duration 8 weeks   PT Treatment/Interventions ADLs/Self Care Home Management;DME Instruction;Gait training;Stair training;Functional mobility training;Therapeutic activities;Patient/family education;Cognitive remediation;Neuromuscular re-education;Therapeutic exercise;Balance  training;Orthotic Fit/Training;Wheelchair mobility training;Manual techniques;Splinting;Visual/perceptual remediation/compensation;Vestibular;Passive range of motion;Electrical Stimulation   PT Next Visit Plan cont gait, balance, transfer, bed mobility training; try technique for upright posture in standing (if pt able to tolerate); STG's due to be assessed by 07-24-16   Consulted and Agree with Plan of Care Patient;Family member/caregiver   Family Member Consulted caregiver, [name] and husband [name]      Patient will benefit from skilled therapeutic intervention in order to improve the following deficits and impairments:  Abnormal gait, Decreased balance, Decreased endurance, Decreased cognition, Cardiopulmonary status limiting activity, Decreased activity tolerance, Decreased coordination, Decreased strength, Impaired flexibility, Impaired tone, Decreased mobility, Increased muscle spasms, Impaired vision/preception, Decreased range of motion, Impaired UE functional use, Postural dysfunction, Improper body mechanics, Impaired perceived functional ability, Decreased knowledge of precautions, Decreased knowledge of use of DME  Visit Diagnosis: Unsteadiness on feet  Muscle weakness (generalized)  Abnormal posture  Other symptoms and signs involving the nervous system     Problem List Patient Active Problem List   Diagnosis Date Noted  . Malignant neoplasm of upper-inner quadrant of left breast in female, estrogen receptor positive ([place]) 07/14/2016  . Heterotopic ossification of bone 03/10/2016  . Acute on chronic respiratory failure with hypoxia ([place])   . Acute hypoxemic respiratory failure ([place])   . Acute blood loss anemia   . Atelectasis 02/04/2016  . Debility 02/03/2016  . Pneumonia of both lower lobes due to Pseudomonas species ([place])   . Pneumonia of both lower lobes due to methicillin susceptible Staphylococcus aureus (MSSA) ([place])   . Acute respiratory failure with hypoxia ([place])   .  Acute encephalopathy   . Seizures ([place])   . Sepsis ([place])   . Hypoxia   . Pain   . HCAP (healthcare-associated pneumonia) 12/28/2015  . Chronic respiratory failure ([place]) 12/28/2015  . Acute tracheobronchitis 12/24/2015  . Tracheostomy tube present ([place])   . Tracheal stenosis   . Chronic respiratory failure with hypoxia ([place])   . 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 Tracheostomy status ([place]) 09/26/2015  . Allergic rhinitis 09/26/2015  . 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 Movement disorder 09/22/2015  . Encephalitis 09/22/2015  . Chest pain 02/07/2014  . Premature atrial contractions 01/13/2014  . Abnormality of gait 12/04/2013  . Hyperlipidemia 12/21/2011  . Cardiovascular risk factor 12/21/2011  . Bunion 01/31/2008  . Metatarsalgia of both feet 09/20/2007  . FLAT FOOT 09/20/2007  . HALLUX RIGIDUS, ACQUIRED 09/20/2007    Cameron Sprang, PT, MPT [facility] [address] [place], [place], [address] Phone: [phone]   Fax:  [phone] 07/22/16, 4:54 PM  Name: Judy Spears MRN: [id number] Date of Birth: 04/23/1950

## 2016-07-23 ENCOUNTER — Encounter: Payer: Self-pay | Admitting: Adult Health

## 2016-07-23 ENCOUNTER — Other Ambulatory Visit: Payer: Self-pay | Admitting: Physical Medicine & Rehabilitation

## 2016-07-23 ENCOUNTER — Encounter: Payer: Self-pay | Admitting: Physical Medicine & Rehabilitation

## 2016-07-23 NOTE — Telephone Encounter (Signed)
E-mail from pt's spouse stating they need to reschedule pt's 7/9 appt w/ TP  No openings with SG, TP or BQ until 7/30 w/ SG E-mail sent back to spouse informing him of this and offering appt times on that date Will await word back from spouse regarding appt date/times

## 2016-07-26 ENCOUNTER — Ambulatory Visit: Payer: Medicare Other | Admitting: Occupational Therapy

## 2016-07-26 ENCOUNTER — Ambulatory Visit: Payer: Medicare Other | Attending: Physical Medicine & Rehabilitation | Admitting: Rehabilitation

## 2016-07-26 ENCOUNTER — Encounter: Payer: Self-pay | Admitting: Rehabilitation

## 2016-07-26 ENCOUNTER — Encounter: Payer: Self-pay | Admitting: Occupational Therapy

## 2016-07-26 DIAGNOSIS — R41844 Frontal lobe and executive function deficit: Secondary | ICD-10-CM

## 2016-07-26 DIAGNOSIS — M25622 Stiffness of left elbow, not elsewhere classified: Secondary | ICD-10-CM | POA: Insufficient documentation

## 2016-07-26 DIAGNOSIS — R471 Dysarthria and anarthria: Secondary | ICD-10-CM | POA: Insufficient documentation

## 2016-07-26 DIAGNOSIS — R2681 Unsteadiness on feet: Secondary | ICD-10-CM

## 2016-07-26 DIAGNOSIS — R293 Abnormal posture: Secondary | ICD-10-CM | POA: Diagnosis present

## 2016-07-26 DIAGNOSIS — M6281 Muscle weakness (generalized): Secondary | ICD-10-CM

## 2016-07-26 DIAGNOSIS — R41841 Cognitive communication deficit: Secondary | ICD-10-CM | POA: Insufficient documentation

## 2016-07-26 DIAGNOSIS — M25612 Stiffness of left shoulder, not elsewhere classified: Secondary | ICD-10-CM | POA: Diagnosis present

## 2016-07-26 DIAGNOSIS — R2689 Other abnormalities of gait and mobility: Secondary | ICD-10-CM | POA: Diagnosis present

## 2016-07-26 DIAGNOSIS — R1312 Dysphagia, oropharyngeal phase: Secondary | ICD-10-CM | POA: Diagnosis present

## 2016-07-26 DIAGNOSIS — R29818 Other symptoms and signs involving the nervous system: Secondary | ICD-10-CM

## 2016-07-26 DIAGNOSIS — R278 Other lack of coordination: Secondary | ICD-10-CM | POA: Insufficient documentation

## 2016-07-26 DIAGNOSIS — M25611 Stiffness of right shoulder, not elsewhere classified: Secondary | ICD-10-CM | POA: Insufficient documentation

## 2016-07-26 NOTE — Therapy (Signed)
[facility] [address] [place], [place], [address] Phone: [phone]   Fax:  [phone]  Physical Therapy Treatment  Patient Details  Name: Judy Spears MRN: [id number] Date of Birth: 1950-06-02 Referring Provider: Alger Simons, MD/Andrew Letta Pate, MD  Encounter Date: 07/26/2016      PT End of Session - 07/26/16 1601    Visit Number 39   Number of Visits 61   Date for PT Re-Evaluation 08/23/16   Authorization Type BCBS   PT Start Time 1446   PT Stop Time 1535   PT Time Calculation (min) 49 min   Activity Tolerance Patient tolerated treatment well   Behavior During Therapy [facility] for tasks assessed/performed      Past Medical History:  Diagnosis Date  . Arthritis    lt great toe  . Complication of anesthesia    pt has had headaches post op-did advise to hydra well preop  . Hair loss    right-sided  . History of exercise stress test    a. ETT (12/15) with 12:00 exercise, no ST changes, occasional PACs.   . Hyperlipidemia   . Insomnia   . Migraine headache   . Movement disorder    resltess in left legs  . Postmenopausal   . 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 Premature atrial contractions    a. Holter (12/15) with 8% PACs, no atrial runs or atrial fibrillation.     Past Surgical History:  Procedure Laterality Date  . BREAST BIOPSY  01/01/2011   Procedure: BREAST BIOPSY WITH NEEDLE LOCALIZATION;  Surgeon: Rolm Bookbinder, MD;  Location: [place] [facility];  Service: General;  Laterality: Left;  left breast wire localization biopsy  . BREAST BIOPSY Right 07/27/2012  . BREAST BIOPSY Right 07/07/2016  . BREAST BIOPSY Left 06/30/2016  . cataracts right eye    . CESAREAN SECTION  1986  . HERNIA REPAIR  2000   RIH  . opirectinal membrane peel    . PEG PLACEMENT    . RHINOPLASTY  1983  . TRACHEOSTOMY      There were no vitals filed for this visit.      Subjective Assessment - 07/26/16 1549     Subjective Spouse reports that they have done a couple of stairs with +2A at theater once.    Patient is accompained by: Family member   Pertinent History Precautions:  PEG.  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  Another round of botox in LLE on 02/06/16   Patient Stated Goals Per husband, "For [place] to be as independent as possible."   Currently in Pain? No/denies                         [facility] Adult PT Treatment/Exercise - 07/26/16 0001      Transfers   Transfers Sit to Stand;Stand to Sit;Stand Pivot Transfers   Sit to Stand 4: Min assist   Sit to Stand Details Tactile cues for weight shifting;Tactile cues for posture;Tactile cues for sequencing   Sit to Stand Details (indicate cue type and reason) Pt doing much better with increased forward weight shift.  Still relies heavily on UEs esp when placed on front of RW.    Stand to Sit 4: Min guard   Stand to Sit Details (indicate cue type  and reason) Tactile cues for initiation;Tactile cues for sequencing;Tactile cues for weight shifting;Tactile cues for posture   Stand to Sit Details Pt did much better during session with placement of hands back on RW and "walking them down" RW as she leans forward.   Cues for increased forward trunk lean but note marked improvement in abililty to control descent during session.    Stand Pivot Transfers 4: Min assist   Stand Pivot Transfer Details (indicate cue type and reason) Max verbal step by step cuing for sequencing of LEs and RW management when turning.     Comments Performed turning transfers x 3 reps     Ambulation/Gait   Ambulation/Gait Yes   Ambulation/Gait Assistance 4: Min assist   Ambulation/Gait Assistance Details Continue to work on gait with RW in order to improve postural control,  improved righting reaction, and improved lateral and forward weight shift onto stance leg.  Note that she did much better today with cues to correct not only R head tilt but also was able to increase R shoulder/trunk tilt in order to better translate over RLE in stance.  Cues for posture as she did demo more forward trunk lean today.  (Per spouse is more fatigued today).    Ambulation Distance (Feet) 65 Feet   Assistive device Rolling walker   Gait Pattern Decreased stance time - right;Decreased hip/knee flexion - right;Decreased hip/knee flexion - left;Decreased weight shift to right;Scissoring;Ataxic   Ambulation Surface Level;Indoor   Stairs Yes   Stairs Assistance 3: Mod assist;2: Max assist   Stairs Assistance Details (indicate cue type and reason) Mod A to ascend stairs with cues for weight shift, posture and using LE to elevate rather than "pulling' with arms.  Pt did very well in alternating stepping pattern to ascend.  Note when pt descends she requires max A due to increased forward trunk lean and inability to control descent (as with sitting).   max cues for posture and bending stance leg to allow descent to next step.  Had pt transition hands from rails to PTs shoulders to prevent further forward trunk/body lean.     Stair Management Technique Two rails;Alternating pattern;Step to pattern;No rails  PTs shoulders   Number of Stairs 4   Height of Stairs 6                PT Education - 07/26/16 1600    Education provided Yes   Education Details purpose of stair training   Person(s) Educated Patient;Spouse;Caregiver(s)   Methods Explanation   Comprehension Verbalized understanding          PT Short Term Goals - 07/22/16 1322      PT SHORT TERM GOAL #1   Title Jay Back on pt's current manual wheelchair to be switched out to regular back; pt will demonstrate abilty to sit in wheelchair with upright with less supportive wheelchair back.  07-24-16   Baseline Jay Back on  current wheelchair   Time 4   Period Weeks   Status Achieved     PT SHORT TERM GOAL #2   Title Pt will perform sit to stand from wheelchair to RW with CGA.  07-24-16   Baseline min/guard to min A 07/22/16   Time 4   Period Weeks   Status Partially Met     PT SHORT TERM GOAL #3   Title Family to obtain bil. platform RW to begin assisting pt with ambulation in her home environment.  07-24-16   Baseline met  with B PFRW 07/22/16   Time 4   Period Weeks   Status Achieved     PT SHORT TERM GOAL #4   Title Family will report pt rolling from supine to Rt and Lt sides in hospital bed at home with supervision without use of bed rail with cues for positioning of LE's.  07-24-16   Baseline pt needs min assist at times - activity just demonstrated by pt on 06-24-16   Time 4   Period Weeks   Status New     PT SHORT TERM GOAL #5   Title Basic transfers from wheelchair to bed or commode with CGA.  07-24-16   Baseline min A 07/22/16 with RW   Time 4   Period Weeks   Status Partially Met     PT SHORT TERM GOAL #6   Title Amb. 92' with bil. platform RW with CGA to begin amb. at home with assistance safely.  07-24-16   Baseline 115' with BPFRW and now RW with min A 07/22/16   Time 4   Period Weeks   Status Partially Met           PT Long Term Goals - 06/25/16 1142      PT LONG TERM GOAL #1   Title Pt will perform supine rolling R and L at mod I level in order to indicate improved independence with bed mobility.  (Target Date: 06/25/16)/ 08-23-16  CONT. LTG til 08-23-16   Baseline inconsistent in performance - pt needs cues for LE flexion to assist with pushing for rolling and cues for UE positioning   Time 8   Period Weeks   Status On-going     PT LONG TERM GOAL #2   Title Pt will perform sit>SL R or L modified independently in order to indicate improved independence with getting into bed.   08-23-16   Baseline min to mod assist needed - 06-24-16   Time 8   Status New     PT LONG TERM GOAL  #3   Title Pt will transfer Lt/Rt sidelying to sitting with CGA for incr. independence with getting out of bed.  08-23-16   Baseline min to mod assist needed -06-24-16   Time 8   Period Weeks   Status New     PT LONG TERM GOAL #4   Title Pt will perform basic transfers with CGA to indicate improved functional independence.  08-23-16   Baseline min assist needed - 06-24-16   Time 8   Period Weeks   Status New     PT LONG TERM GOAL #5   Title Pt will perform static standing with bil. UE support with distant supervision for >/= 10" for improved standing balance.  08-23-16   Baseline close SBA to CGA needed - pt standing with bil UE support for 8-10" per caregiver's report -- 06-24-16   Time 8   Period Weeks   Status New     Additional Long Term Goals   Additional Long Term Goals Yes     PT LONG TERM GOAL #6   Title Pt will demonstrate ability to ambulate up to 25' w/ LRAD at min A level in order to assist with getting around home.  CONT LTG til 08-23-16; goal upgraded to 50'   Baseline bil. platform RW to be used in home   Time 8   Period Weeks   Status On-going     PT LONG TERM GOAL #7   Title Perform  sit to stand and stand to sit to RW or to another object with CGA.  08-23-16   Baseline min assist needed - 06-24-16   Time 8   Period Weeks   Status New               Plan - 07/26/16 1601    Clinical Impression Statement Skilled session focused on gait training with RW in order to continue to work on postural control and improved lateral and forward weight shift onto stance leg.  Note improvement with R whole body lateral weight shift today vs only head tilt in previous session.   Also performed stairs to also address controlled descent (as with sitting) and improved weight shifting.     Rehab Potential Fair   Clinical Impairments Affecting Rehab Potential Chronicity of condition, continued medical management, but has very strong family and financial support   PT Frequency 2x /  week   PT Duration 8 weeks   PT Treatment/Interventions ADLs/Self Care Home Management;DME Instruction;Gait training;Stair training;Functional mobility training;Therapeutic activities;Patient/family education;Cognitive remediation;Neuromuscular re-education;Therapeutic exercise;Balance training;Orthotic Fit/Training;Wheelchair mobility training;Manual techniques;Splinting;Visual/perceptual remediation/compensation;Vestibular;Passive range of motion;Electrical Stimulation   PT Next Visit Plan cont gait, balance, transfer, bed mobility training; try technique for upright posture in standing (if pt able to tolerate)   Consulted and Agree with Plan of Care Patient;Family member/caregiver   Family Member Consulted caregiver, [name] and husband [name]      Patient will benefit from skilled therapeutic intervention in order to improve the following deficits and impairments:  Abnormal gait, Decreased balance, Decreased endurance, Decreased cognition, Cardiopulmonary status limiting activity, Decreased activity tolerance, Decreased coordination, Decreased strength, Impaired flexibility, Impaired tone, Decreased mobility, Increased muscle spasms, Impaired vision/preception, Decreased range of motion, Impaired UE functional use, Postural dysfunction, Improper body mechanics, Impaired perceived functional ability, Decreased knowledge of precautions, Decreased knowledge of use of DME  Visit Diagnosis: Unsteadiness on feet  Muscle weakness (generalized)  Abnormal posture  Other symptoms and signs involving the nervous system     Problem List Patient Active Problem List   Diagnosis Date Noted  . Malignant neoplasm of upper-inner quadrant of left breast in female, estrogen receptor positive ([place]) 07/14/2016  . Heterotopic ossification of bone 03/10/2016  . Acute on chronic respiratory failure with hypoxia ([place])   . Acute hypoxemic respiratory failure ([place])   . Acute blood loss anemia   . Atelectasis  02/04/2016  . Debility 02/03/2016  . Pneumonia of both lower lobes due to Pseudomonas species ([place])   . Pneumonia of both lower lobes due to methicillin susceptible Staphylococcus aureus (MSSA) ([place])   . Acute respiratory failure with hypoxia ([place])   . Acute encephalopathy   . Seizures ([place])   . Sepsis ([place])   . Hypoxia   . Pain   . HCAP (healthcare-associated pneumonia) 12/28/2015  . Chronic respiratory failure ([place]) 12/28/2015  . Acute tracheobronchitis 12/24/2015  . Tracheostomy tube present ([place])   . Tracheal stenosis   . Chronic respiratory failure with hypoxia ([place])   . Spastic tetraplegia ([place]) 10/20/2015  . Tracheostomy status ([place]) 09/26/2015  . Allergic rhinitis 09/26/2015  . 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 Movement disorder 09/22/2015  . Encephalitis 09/22/2015  . Chest pain 02/07/2014  . Premature atrial contractions 01/13/2014  . Abnormality of gait 12/04/2013  . Hyperlipidemia 12/21/2011  . Cardiovascular risk factor 12/21/2011  . Bunion 01/31/2008  . Metatarsalgia of both feet 09/20/2007  . FLAT FOOT 09/20/2007  . HALLUX RIGIDUS, ACQUIRED 09/20/2007    Cameron Sprang,  PT, MPT [facility] [address] [place], [place], [address] Phone: [phone]   Fax:  [phone] 07/26/16, 4:05 PM  Name: VALARY MANAHAN MRN: [id number] Date of Birth: Aug 05, 1950

## 2016-07-26 NOTE — Therapy (Signed)
[facility] [address] [place], [place], [address] Phone: [phone]   Fax:  [phone]  Occupational Therapy Treatment  Patient Details  Name: Judy Spears MRN: [id number] Date of Birth: [birth year] Referring Provider: Dr. Quita Skye. [name]  Encounter Date: 07/26/2016      OT End of Session - 07/26/16 1557    Visit Number 39   Number of Visits 51   Date for OT Re-Evaluation 09/07/16   OT Start Time 1401   OT Stop Time 1445   OT Time Calculation (min) 44 min   Activity Tolerance Patient tolerated treatment well      Past Medical History:  Diagnosis Date  . Arthritis    lt great toe  . Complication of anesthesia    pt has had headaches post op-did advise to hydra well preop  . Hair loss    right-sided  . History of exercise stress test    a. ETT (12/15) with 12:00 exercise, no ST changes, occasional PACs.   . Hyperlipidemia   . Insomnia   . Migraine headache   . Movement disorder    resltess in left legs  . Postmenopausal   . 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 Premature atrial contractions    a. Holter (12/15) with 8% PACs, no atrial runs or atrial fibrillation.     Past Surgical History:  Procedure Laterality Date  . BREAST BIOPSY  01/01/2011   Procedure: BREAST BIOPSY WITH NEEDLE LOCALIZATION;  Surgeon: Rolm Bookbinder, MD;  Location: [facility];  Service: General;  Laterality: Left;  left breast wire localization biopsy  . BREAST BIOPSY Right 07/27/2012  . BREAST BIOPSY Right 07/07/2016  . BREAST BIOPSY Left 06/30/2016  . cataracts right eye    . CESAREAN SECTION  1986  . HERNIA REPAIR  2000   RIH  . opirectinal membrane peel    . PEG PLACEMENT    . RHINOPLASTY  1983  . TRACHEOSTOMY      There were no vitals filed for this visit.      Subjective Assessment - 07/26/16 1550    Subjective  Bye at end of session   Patient is accompained by: Family member  husband [name],  caregiver [name]   Pertinent History see epic snapshot - ABI/encephalitis/hypoxia from virus; recent hospitalization due to ARF;    Patient Stated Goals Pt unable to state.    Currently in Pain? No/denies                      OT Treatments/Exercises (OP) - 07/26/16 0001      Neurological Re-education Exercises   Other Exercises 1 Neuro re ed to address scooting, scoot pivot transfers chair to mat, forward and right weight shift, sit to stand, static standing balance, stepping, functional ambulation and stand to sit.  WIthin the context of these activities emphasized thoracic extension, postural alignment and control, postural orientation, and ability to actively shift weight during activities.  Pt with great difficulty knowing where she is in space however feel there is  slow improvement with this.  Pt also more concious of head alignment and position with unsupported sitting and standing.                   OT Short Term Goals - 07/26/16 1554      OT SHORT TERM GOAL #1   Title Pt, husband and cargiver will be mod I with HEP - 03/29/2016   Status  Achieved     OT SHORT TERM GOAL #2   Title Pt will be able to sit with supervision without UE support and vc's to assist with midline 75% while engage in functional task   Status Achieved     OT SHORT TERM GOAL #3   Title Pt will be able to complete sit to stand consistently with mod A for toilet transfers (max a for pivot)   Status Achieved     OT SHORT TERM GOAL #4   Title Pt, husband and caregiver will verbalize understanding of various bathroom accomodations to reduce long term caregiver burden   Status Achieved     OT SHORT TERM GOAL #5   Title Pt will be able to stand step transfer to toilet with mo more than mod a, mod vc's consistently using grab bar for community toileting needs- 06/10/2016 date adjusted as pt missed 2 weeks due to hospitalization   Status Achieved     OT SHORT TERM GOAL #6   Title Pt will be  able to wash face with supervision/max cues 50% of the time, min a 50% of the time.    Status Achieved     OT SHORT TERM GOAL #7   Title Pt will be able to don pull over shirt with mod a, max cues   Status Achieved     OT SHORT TERM GOAL #8   Title Pt will be able to maintain static standing with UE support with close supervision to reduce burden of care for caregivers    Status Achieved     OT SHORT TERM GOAL  #9   TITLE Pt and family will be mod I with upgraded home activities program - 08/09/2016   Status On-going     OT SHORT TERM GOAL  #10   TITLE Pt will be min a stand step toilet transfers at least 50% of the time using either grab bar or walker for community transfers   Status On-going     OT SHORT TERM GOAL  #11   TITLE Pt will consistently move from sit to stand with no more than min a for functional mobility   Status On-going     OT SHORT TERM GOAL  #12   TITLE Pt will be no more than mod a consistently for bathing at shower level   Status On-going           OT Long Term Goals - 07/26/16 1555      OT LONG TERM GOAL #1   Title Pt, husband and caregiver will be mod I with upgraded HEP prn - 04/26/2016   Status Achieved     OT LONG TERM GOAL #2   Title Pt, husband and caregiver will be mod I with splint wear and care    Status Achieved     OT LONG TERM GOAL #3   Title Pt will be able to wash face with min a only   Status Achieved     OT LONG TERM GOAL #4   Title Pt will be able to sit without UE support with supevision 100% of the time with no more than 2 vc's while engaged in functional activity.   Status Achieved     OT LONG TERM GOAL #5   Title Pt, husband and caregiver will verbalize understanding of potential after care therapeutic opportunities. - 07/08/2016 date adjusted as pt missed 2 weeks due to hospitalization   Status Achieved     OT LONG TERM  GOAL #6   Title Pt will be supervision to wash face consistently   Status Achieved     OT LONG TERM  GOAL #7   Title Pt will don shirt with min a and max vc's 50% of the time   Status Achieved     OT LONG TERM GOAL #8   Title Pt will require min a for stand step transfers to toilet using grab bar or walker for community toileting at least 75% of the time. - 09/06/2016   Status On-going     OT LONG TERM GOAL  #9   Baseline Pt will stand with contact gurard with only 1 UE support for static standing balance in prep for abiilty to engage in functional task while standing.    Status Achieved     OT LONG TERM GOAL  #10   TITLE Pt will consistenly move from sit to stand with no more than CGA while caregiver assists with pulling up pants.    Status On-going     OT LONG TERM GOAL  #11   TITLE Pt will require no more than close supervision for static standing with bilateral UE while caregiver assists with hiking pants.    Status On-going     OT LONG TERM GOAL  #12   TITLE Pt will go from stand to sit during ADL activities with no more than min a.    Status On-going               Plan - 07/26/16 1555    Clinical Impression Statement Pt with steady progress toward goals. Pt with slowly improving postural orientation and basic mobility.     Rehab Potential Fair   Current Impairments/barriers affecting progress: severity of deficits, very slow rate of recovery; pt recently diagnosed with breast cancer - will monitor ability to tolerate therapy   OT Frequency 2x / week   OT Duration 8 weeks   OT Treatment/Interventions Self-care/ADL training;Ultrasound;Moist Heat;Electrical Stimulation;DME and/or AE instruction;Neuromuscular education;Therapeutic exercise;Functional Mobility Training;Manual Therapy;Passive range of motion;Splinting;Therapeutic activities;Balance training;Patient/family education;Visual/perceptual remediation/compensation;Cognitive remediation/compensation   Plan Assess toilet transfers using grab bar, NMR for trunk, UEs, functional mobility in context of functional tasks,  balance, postural orientation/alignment/control in kneeling and standing.   Consulted and Agree with Plan of Care Patient;Family member/caregiver   Family Member Consulted caregiver [name] , husband [name]      Patient will benefit from skilled therapeutic intervention in order to improve the following deficits and impairments:  Decreased endurance, Decreased skin integrity, Impaired vision/preception, Improper body mechanics, Impaired perceived functional ability, Decreased knowledge of precautions, Cardiopulmonary status limiting activity, Decreased activity tolerance, Decreased knowledge of use of DME, Decreased strength, Impaired flexibility, Improper spinal/pelvic alignment, Impaired sensation, Difficulty walking, Decreased mobility, Decreased balance, Decreased cognition, Decreased range of motion, Impaired tone, Impaired UE functional use, Decreased safety awareness, Decreased coordination, Pain  Visit Diagnosis: Unsteadiness on feet  Muscle weakness (generalized)  Abnormal posture  Other symptoms and signs involving the nervous system  Stiffness of left shoulder, not elsewhere classified  Stiffness of right shoulder, not elsewhere classified  Stiffness of left elbow, not elsewhere classified  Frontal lobe and executive function deficit    Problem List Patient Active Problem List   Diagnosis Date Noted  . Malignant neoplasm of upper-inner quadrant of left breast in female, estrogen receptor positive ([place]) 07/14/2016  . Heterotopic ossification of bone 03/10/2016  . Acute on chronic respiratory failure with hypoxia ([place])   . Acute hypoxemic respiratory failure ([place])   .  Acute blood loss anemia   . Atelectasis 02/04/2016  . Debility 02/03/2016  . Pneumonia of both lower lobes due to Pseudomonas species ([place])   . Pneumonia of both lower lobes due to methicillin susceptible Staphylococcus aureus (MSSA) ([place])   . Acute respiratory failure with hypoxia ([place])   . Acute  encephalopathy   . Seizures ([place])   . Sepsis ([place])   . Hypoxia   . Pain   . HCAP (healthcare-associated pneumonia) 12/28/2015  . Chronic respiratory failure ([place]) 12/28/2015  . Acute tracheobronchitis 12/24/2015  . Tracheostomy tube present ([place])   . Tracheal stenosis   . Chronic respiratory failure with hypoxia ([place])   . Spastic tetraplegia ([place]) 10/20/2015  . Tracheostomy status ([place]) 09/26/2015  . Allergic rhinitis 09/26/2015  . 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 Movement disorder 09/22/2015  . Encephalitis 09/22/2015  . Chest pain 02/07/2014  . Premature atrial contractions 01/13/2014  . Abnormality of gait 12/04/2013  . Hyperlipidemia 12/21/2011  . Cardiovascular risk factor 12/21/2011  . Bunion 01/31/2008  . Metatarsalgia of both feet 09/20/2007  . FLAT FOOT 09/20/2007  . HALLUX RIGIDUS, ACQUIRED 09/20/2007    Quay Burow, OTR/L 07/26/2016, 3:58 PM  [facility] [address] [place], [place], [address] Phone: [phone]   Fax:  [phone]  Name: Judy Spears MRN: [id number] Date of Birth: 11-26-1950

## 2016-07-27 ENCOUNTER — Ambulatory Visit: Payer: Medicare Other | Admitting: Occupational Therapy

## 2016-07-27 ENCOUNTER — Ambulatory Visit: Payer: Medicare Other

## 2016-07-27 ENCOUNTER — Encounter: Payer: Self-pay | Admitting: Occupational Therapy

## 2016-07-27 DIAGNOSIS — M25612 Stiffness of left shoulder, not elsewhere classified: Secondary | ICD-10-CM

## 2016-07-27 DIAGNOSIS — M25622 Stiffness of left elbow, not elsewhere classified: Secondary | ICD-10-CM

## 2016-07-27 DIAGNOSIS — M6281 Muscle weakness (generalized): Secondary | ICD-10-CM

## 2016-07-27 DIAGNOSIS — R1312 Dysphagia, oropharyngeal phase: Secondary | ICD-10-CM

## 2016-07-27 DIAGNOSIS — R471 Dysarthria and anarthria: Secondary | ICD-10-CM

## 2016-07-27 DIAGNOSIS — R41841 Cognitive communication deficit: Secondary | ICD-10-CM

## 2016-07-27 DIAGNOSIS — R2681 Unsteadiness on feet: Secondary | ICD-10-CM | POA: Diagnosis not present

## 2016-07-27 DIAGNOSIS — M25611 Stiffness of right shoulder, not elsewhere classified: Secondary | ICD-10-CM

## 2016-07-27 DIAGNOSIS — R293 Abnormal posture: Secondary | ICD-10-CM

## 2016-07-27 DIAGNOSIS — R29818 Other symptoms and signs involving the nervous system: Secondary | ICD-10-CM

## 2016-07-27 DIAGNOSIS — R41844 Frontal lobe and executive function deficit: Secondary | ICD-10-CM

## 2016-07-27 NOTE — Therapy (Signed)
[facility] [address] [place], [place], [address] Phone: [phone]   Fax:  [phone]  Occupational Therapy Treatment  Patient Details  Name: Judy Spears MRN: [id number] Date of Birth: 09/13/1950 Referring Provider: Dr. Quita Skye. [name]  Encounter Date: 07/27/2016      OT End of Session - 07/27/16 1606    Visit Number 40   Number of Visits 64   Date for OT Re-Evaluation 09/07/16   Authorization Type [employer] unlimited visits   OT Start Time 7654   OT Stop Time 1540   OT Time Calculation (min) 53 min   Activity Tolerance Patient tolerated treatment well   Behavior During Therapy [facility] for tasks assessed/performed      Past Medical History:  Diagnosis Date  . Arthritis    lt great toe  . Complication of anesthesia    pt has had headaches post op-did advise to hydra well preop  . Hair loss    right-sided  . History of exercise stress test    a. ETT (12/15) with 12:00 exercise, no ST changes, occasional PACs.   . Hyperlipidemia   . Insomnia   . Migraine headache   . Movement disorder    resltess in left legs  . Postmenopausal   . 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 Premature atrial contractions    a. Holter (12/15) with 8% PACs, no atrial runs or atrial fibrillation.     Past Surgical History:  Procedure Laterality Date  . BREAST BIOPSY  01/01/2011   Procedure: BREAST BIOPSY WITH NEEDLE LOCALIZATION;  Surgeon: Rolm Bookbinder, MD;  Location: [facility];  Service: General;  Laterality: Left;  left breast wire localization biopsy  . BREAST BIOPSY Right 07/27/2012  . BREAST BIOPSY Right 07/07/2016  . BREAST BIOPSY Left 06/30/2016  . cataracts right eye    . CESAREAN SECTION  1986  . HERNIA REPAIR  2000   RIH  . opirectinal membrane peel    . PEG PLACEMENT    . RHINOPLASTY  1983  . TRACHEOSTOMY      There were no vitals filed for this visit.      Subjective Assessment - 07/27/16 1559     Subjective  My grandchildren are here visiting   Patient is accompained by: Family member   Pertinent History see epic snapshot - ABI/encephalitis/hypoxia from virus; recent hospitalization due to ARF;    Patient Stated Goals Pt unable to state.    Currently in Pain? No/denies                      OT Treatments/Exercises (OP) - 07/27/16 0001      ADLs   Eating Patient able to hold and drink from nosey cup without assistance this session - using left hand.     Toileting Practiced stand step toilet transfers using grab bar with only intermittent minimal assistance.  Patient needed consistent help to bring right arm to grab bar, but once there, able to pull to stand without further help.  Patient tends to move quickly, with bursts of muscle activity, this then leads to loss of balance.  With slower controlled movements, patient able to pull to stand consistently, and not lose balance, or correct minor balance problems.  Patient able to maintain trunk control with use of grab bar to stand step toward toilet.  Patient needing consistent min assist to transtion from standing to sitting.     Functional Mobility Worked on biomechanics  of stand to sit using cue look down toward your feet to help initiate flexion pattern in hips and knees.       Splinting   Splinting Replaced straps and velcro on right resting hand splint.  Splint continues to be fitting well.  Husband asked if it should be straightened futher (in finger pan).  Currently it seems to provide sufficient streth to fingers , especially third finger with PIP flexion pattern.  Do not recommend any change to modify splint at this point.  Husband in agreement.                  OT Education - 07/27/16 1605    Education provided Yes   Education Details fit of resting hand splint, toilet transfers with grab bar.     Person(s) Educated Patient;Spouse;Caregiver(s)   Methods Explanation;Demonstration;Tactile cues;Verbal cues    Comprehension Verbalized understanding;Returned demonstration;Verbal cues required;Tactile cues required          OT Short Term Goals - 07/27/16 1609      OT SHORT TERM GOAL  #10   TITLE Pt will be min a stand step toilet transfers at least 50% of the time using either grab bar or walker for community transfers   Status Achieved           OT Long Term Goals - 07/26/16 1555      OT LONG TERM GOAL #1   Title Pt, husband and caregiver will be mod I with upgraded HEP prn - 04/26/2016   Status Achieved     OT LONG TERM GOAL #2   Title Pt, husband and caregiver will be mod I with splint wear and care    Status Achieved     OT LONG TERM GOAL #3   Title Pt will be able to wash face with min a only   Status Achieved     OT LONG TERM GOAL #4   Title Pt will be able to sit without UE support with supevision 100% of the time with no more than 2 vc's while engaged in functional activity.   Status Achieved     OT LONG TERM GOAL #5   Title Pt, husband and caregiver will verbalize understanding of potential after care therapeutic opportunities. - 07/08/2016 date adjusted as pt missed 2 weeks due to hospitalization   Status Achieved     OT LONG TERM GOAL #6   Title Pt will be supervision to wash face consistently   Status Achieved     OT LONG TERM GOAL #7   Title Pt will don shirt with min a and max vc's 50% of the time   Status Achieved     OT LONG TERM GOAL #8   Title Pt will require min a for stand step transfers to toilet using grab bar or walker for community toileting at least 75% of the time. - 09/06/2016   Status On-going     OT LONG TERM GOAL  #9   Baseline Pt will stand with contact gurard with only 1 UE support for static standing balance in prep for abiilty to engage in functional task while standing.    Status Achieved     OT LONG TERM GOAL  #10   TITLE Pt will consistenly move from sit to stand with no more than CGA while caregiver assists with pulling up pants.     Status On-going     OT LONG TERM GOAL  #11   TITLE Pt will require  no more than close supervision for static standing with bilateral UE while caregiver assists with hiking pants.    Status On-going     OT LONG TERM GOAL  #12   TITLE Pt will go from stand to sit during ADL activities with no more than min a.    Status On-going               Plan - 07/27/16 1607    Clinical Impression Statement Patient showing steady improvement in postural control, and functional mobility which is carrying over to improved performance with ADL.     Rehab Potential Fair   Current Impairments/barriers affecting progress: severity of deficits, very slow rate of recovery; pt recently diagnosed with breast cancer - will monitor ability to tolerate therapy   OT Frequency 2x / week   OT Duration 8 weeks   OT Treatment/Interventions Self-care/ADL training;Ultrasound;Moist Heat;Electrical Stimulation;DME and/or AE instruction;Neuromuscular education;Therapeutic exercise;Functional Mobility Training;Manual Therapy;Passive range of motion;Splinting;Therapeutic activities;Balance training;Patient/family education;Visual/perceptual remediation/compensation;Cognitive remediation/compensation   Plan NMR trunk/ UE's, functional mobility, stand to sit, postural orientation/alignment/control in kneeling and standing   OT Home Exercise Plan Reviewed plan for home management for left elbow range of motion - JAS 3x/day FOR 30 min./time, and locked elbow extesnion brace at night   Consulted and Agree with Plan of Care Patient;Family member/caregiver   Family Member Consulted caregiver [name] , husband [name]      Patient will benefit from skilled therapeutic intervention in order to improve the following deficits and impairments:  Decreased endurance, Decreased skin integrity, Impaired vision/preception, Improper body mechanics, Impaired perceived functional ability, Decreased knowledge of precautions, Cardiopulmonary  status limiting activity, Decreased activity tolerance, Decreased knowledge of use of DME, Decreased strength, Impaired flexibility, Improper spinal/pelvic alignment, Impaired sensation, Difficulty walking, Decreased mobility, Decreased balance, Decreased cognition, Decreased range of motion, Impaired tone, Impaired UE functional use, Decreased safety awareness, Decreased coordination, Pain  Visit Diagnosis: Unsteadiness on feet  Muscle weakness (generalized)  Abnormal posture  Other symptoms and signs involving the nervous system  Stiffness of left shoulder, not elsewhere classified  Stiffness of right shoulder, not elsewhere classified  Stiffness of left elbow, not elsewhere classified  Frontal lobe and executive function deficit    Problem List Patient Active Problem List   Diagnosis Date Noted  . Malignant neoplasm of upper-inner quadrant of left breast in female, estrogen receptor positive ([place]) 07/14/2016  . Heterotopic ossification of bone 03/10/2016  . Acute on chronic respiratory failure with hypoxia ([place])   . Acute hypoxemic respiratory failure ([place])   . Acute blood loss anemia   . Atelectasis 02/04/2016  . Debility 02/03/2016  . Pneumonia of both lower lobes due to Pseudomonas species ([place])   . Pneumonia of both lower lobes due to methicillin susceptible Staphylococcus aureus (MSSA) ([place])   . Acute respiratory failure with hypoxia ([place])   . Acute encephalopathy   . Seizures ([name])   . Sepsis ([place])   . Hypoxia   . Pain   . HCAP (healthcare-associated pneumonia) 12/28/2015  . Chronic respiratory failure ([place]) 12/28/2015  . Acute tracheobronchitis 12/24/2015  . Tracheostomy tube present ([employer] [facility])   . Tracheal stenosis   . Chronic respiratory failure with hypoxia ([place])   . Spastic tetraplegia ([place]) 10/20/2015  . Tracheostomy status ([place]) 09/26/2015  . Allergic rhinitis 09/26/2015  . 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 Movement disorder 09/22/2015  .  Encephalitis 09/22/2015  . Chest pain 02/07/2014  . Premature atrial contractions 01/13/2014  . Abnormality of gait 12/04/2013  .  Hyperlipidemia 12/21/2011  . Cardiovascular risk factor 12/21/2011  . Bunion 01/31/2008  . Metatarsalgia of both feet 09/20/2007  . FLAT FOOT 09/20/2007  . HALLUX RIGIDUS, ACQUIRED 09/20/2007    Mariah Milling, OTR/L 07/27/2016, 4:11 PM  [facility] [address] [place], [place], [address] Phone: [phone]   Fax:  [phone]  Name: Judy Spears MRN: [id number] Date of Birth: Dec 11, 1950

## 2016-07-29 ENCOUNTER — Ambulatory Visit: Payer: Medicare Other | Admitting: Rehabilitation

## 2016-07-29 NOTE — Patient Instructions (Signed)
  Please complete the assigned inspiratory/expiratory respiratory homework daily prior to your next session.

## 2016-07-29 NOTE — Therapy (Signed)
[facility] [address] [place], [place], [address] Phone: [phone]   Fax:  [phone]  Speech Language Pathology Treatment  Patient Details  Name: Judy Spears MRN: [id number] Date of Birth: 01-Dec-1950 Referring Provider: Charlett Blake, MD  Encounter Date: 07/27/2016      End of Session - 07/29/16 0909    Visit Number 37   Number of Visits 45   Date for SLP Re-Evaluation 09/10/16   SLP Start Time 1194   SLP Stop Time  1447   SLP Time Calculation (min) 44 min   Activity Tolerance Patient tolerated treatment well      Past Medical History:  Diagnosis Date  . Arthritis    lt great toe  . Complication of anesthesia    pt has had headaches post op-did advise to hydra well preop  . Hair loss    right-sided  . History of exercise stress test    a. ETT (12/15) with 12:00 exercise, no ST changes, occasional PACs.   . Hyperlipidemia   . Insomnia   . Migraine headache   . Movement disorder    resltess in left legs  . Postmenopausal   . 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 Premature atrial contractions    a. Holter (12/15) with 8% PACs, no atrial runs or atrial fibrillation.     Past Surgical History:  Procedure Laterality Date  . BREAST BIOPSY  01/01/2011   Procedure: BREAST BIOPSY WITH NEEDLE LOCALIZATION;  Surgeon: Rolm Bookbinder, MD;  Location: [facility];  Service: General;  Laterality: Left;  left breast wire localization biopsy  . BREAST BIOPSY Right 07/27/2012  . BREAST BIOPSY Right 07/07/2016  . BREAST BIOPSY Left 06/30/2016  . cataracts right eye    . CESAREAN SECTION  1986  . HERNIA REPAIR  2000   RIH  . opirectinal membrane peel    . PEG PLACEMENT    . RHINOPLASTY  1983  . TRACHEOSTOMY      There were no vitals filed for this visit.             ADULT SLP TREATMENT - 07/29/16 0001      General Information   Behavior/Cognition Alert;Cooperative;Pleasant mood     Treatment Provided   Treatment provided Cognitive-Linquistic     Cognitive-Linquistic Treatment   Treatment focused on Voice;Dysarthria   Skilled Treatment Pt req'd tactile and verbal cues for arousal today x4. SLP targeted pt's breath support for basic speech ability. Inspiratory device used beginning at 9 cmH2O, incr'd up to 15cmH2O by one cmH2O with 5 reps at each setting At 15 cmH2O pt demo'd 50% success with appropriate sound ("bike pump"). (all others were >80%). SLP consistently used max A (demo cues of exhaling all air and then quick inhalation) before each repetition. Educated/re-educated family and caregiver what to listen for re: adequate sound - told them to begin at [facility] and do 5 reps, then 14cmH2O, then 15cmH2O until pt can do 8/10 reps with appropriate sound ("bike pump"). SLP then used skills enforced with respiratory muscle training in short answer responses. Pt with functionally audible speech 60% of the time only with demo and verbal cues for loudness.     Tracheostomy Shiley 4 mm Uncuffed   Tracheostomy Properties Placement Date: 05/08/16 Placement Time: 1030 Placed By: ICU physician Brand: Shiley Size (mm): 4 mm Style: Uncuffed     Progression Toward Goals   Progression toward goals Progressing toward goals  SLP Education - 07/29/16 0908    Education provided Yes   Education Details inspiratory reps with repiratory muscle trainer   Person(s) Educated Patient;Spouse;Caregiver(s)   Methods Explanation;Demonstration;Verbal cues   Comprehension Verbalized understanding;Returned demonstration;Verbal cues required;Need further instruction;Tactile cues required          SLP Short Term Goals - 07/13/16 1150      SLP SHORT TERM GOAL #1   Title Patient will utilize intelligibility strategies with moderate visual cues from communication partner/visual aid to communicate 5-7 word wants, needs, thoughts and ideas.   Status Achieved     SLP SHORT TERM GOAL #2    Title Patient will utilize intelligibility strategies, breath support with mod A for phrase repetition in 75% of trials.   Status Achieved     SLP SHORT TERM GOAL #3   Title Pt will swallow thin liquids, purees and soft solids in 9/10 trials with no overt signs of aspiration.   Status Achieved     SLP SHORT TERM GOAL #4   Title Patient will tolerate 6oz water, >95% with no overt signs of aspiration.   Status Achieved     SLP SHORT TERM GOAL #5   Title Patient will utilize verbal cues for lip closure, dry swallow for secretion management in 75% of opportunities.   Status Achieved          SLP Long Term Goals - 07/29/16 4562      SLP LONG TERM GOAL #1   Title Patient will utilize multimodal communication/AAC to clarify wants, needs, thoughts or ideas during communication breakdowns with max A   Status Deferred     SLP LONG TERM GOAL #2   Title Patient will utilize intelligibility strategies, breath support with min A visual cues for 5-7 word responses with 80% accuracy.   Baseline renewed 07-30-14 for week of 08-02-16   Time 5   Period Weeks   Status On-going     SLP LONG TERM GOAL #3   Title Patient will demonstrate readiness for MBS to determine safety for pleasure feeds by timely initiation of swallow in 3/5 trials with ice chips/H20.   Status Achieved     SLP LONG TERM GOAL #4   Title Pt will consume pleasure feeds of desired consistency (soft solids and thin liquids) with signs of aspiration in fewer than 10% of trials with caregiver providing moderate cues and monitoring for overt signs of aspiration.   Baseline renewed 07-29-16 for week of 08-02-16   Time 5   Period Weeks   Status On-going     SLP LONG TERM GOAL #5   Title Patient will utilize visual cue for lip closure, dry swallow for secretion management in 75% of opportunities.   Status Achieved     SLP LONG TERM GOAL #6   Title Pt will demo HEP for RMT, dysphagia with mod caregiver assistance.   Baseline renewed  07-29-16 for week of 08-02-16   Time 5   Period Weeks   Status On-going          Plan - 07/29/16 0909    Clinical Impression Statement Renewal is for week of 08-02-16 (all current goals to remain enforced for pt visit 07-30-16): Pt required intermittent min cues for alertness today; she completed respiratory muscle training (RMT) inspiratory exercises functionally with consistent max (demo and verbal) cues. When SLP attempted to fade cues, pt success decr'd routinely. Pt, family and caregiver were educated/re-educated today on what sound to listen for with respiratory training  and should aim for 80% success before increasing trainer to higher performance level. Pt was recommended dys I diet and thin liquids, however family/pt elects to consume dys 3 diet (mech soft) with thin liquids. Pt will benefit from 2-4 more weeks of continued skilled ST to address underlying physical impairments impacting swallowing and speech, but focus mainly on education and training in monitoring for and reducing risks of aspiration. Likely discharge from this course of ST in next 2-4 weeks due to family/caregiver able to provide necessary treatment at home, with re-eval possible in the future to assess modification to home program.   Speech Therapy Frequency 2x / week   Duration --  5 weeks or 8 more sessions, at most   Treatment/Interventions Aspiration precaution training;Trials of upgraded texture/liquids;Compensatory strategies;Functional tasks;Patient/family education;Cueing hierarchy;Diet toleration management by SLP;Cognitive reorganization;Compensatory techniques;SLP instruction and feedback;Internal/external aids;Multimodal communcation approach;Pharyngeal strengthening exercises   Potential to Achieve Goals Good   Potential Considerations Ability to learn/carryover information;Co-morbidities;Severity of impairments   SLP Home Exercise Plan Pleasure feeds purees, thin liquids, strict oral care before and after PO,  monitor for signs of fatigue, aspiration. RMT   Consulted and Agree with Plan of Care Family member/caregiver;Patient   Family Member Consulted husband chip, caregiver lisa      Patient will benefit from skilled therapeutic intervention in order to improve the following deficits and impairments:   Dysarthria and anarthria  Dysphagia, oropharyngeal phase  Cognitive communication deficit    Problem List Patient Active Problem List   Diagnosis Date Noted  . Malignant neoplasm of upper-inner quadrant of left breast in female, estrogen receptor positive ([place]) 07/14/2016  . Heterotopic ossification of bone 03/10/2016  . Acute on chronic respiratory failure with hypoxia ([place])   . Acute hypoxemic respiratory failure ([place])   . Acute blood loss anemia   . Atelectasis 02/04/2016  . Debility 02/03/2016  . Pneumonia of both lower lobes due to Pseudomonas species ([place])   . Pneumonia of both lower lobes due to methicillin susceptible Staphylococcus aureus (MSSA) ([place])   . Acute respiratory failure with hypoxia ([place])   . Acute encephalopathy   . Seizures ([place])   . Sepsis ([place])   . Hypoxia   . Pain   . HCAP (healthcare-associated pneumonia) 12/28/2015  . Chronic respiratory failure ([place]) 12/28/2015  . Acute tracheobronchitis 12/24/2015  . Tracheostomy tube present ([place])   . Tracheal stenosis   . Chronic respiratory failure with hypoxia ([place])   . 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 Tracheostomy status ([place]) 09/26/2015  . Allergic rhinitis 09/26/2015  . 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 Movement disorder 09/22/2015  . Encephalitis 09/22/2015  . Chest pain 02/07/2014  . Premature atrial contractions 01/13/2014  . Abnormality of gait 12/04/2013  . Hyperlipidemia 12/21/2011  . Cardiovascular risk factor 12/21/2011  . Bunion 01/31/2008  . Metatarsalgia of both feet 09/20/2007  . FLAT FOOT 09/20/2007  . HALLUX RIGIDUS, ACQUIRED 09/20/2007    [facility],  [address]  07/29/2016, 9:31 AM  [facility] [address] [place], [place], [address] Phone: [phone]   Fax:  [phone]   Name: MARGARINE GROSSHANS MRN: [id number] Date of Birth: [birth year]

## 2016-07-30 ENCOUNTER — Ambulatory Visit: Payer: Medicare Other

## 2016-07-30 DIAGNOSIS — R471 Dysarthria and anarthria: Secondary | ICD-10-CM

## 2016-07-30 DIAGNOSIS — R2681 Unsteadiness on feet: Secondary | ICD-10-CM | POA: Diagnosis not present

## 2016-07-30 DIAGNOSIS — R41841 Cognitive communication deficit: Secondary | ICD-10-CM

## 2016-07-30 DIAGNOSIS — R1312 Dysphagia, oropharyngeal phase: Secondary | ICD-10-CM

## 2016-07-30 NOTE — Patient Instructions (Signed)
Continue to work with clear device (inspiratory) at 13 cmH2O, then inch up to 15cmH2O as [name] is successful with "bike pump" sound 80% of the time.

## 2016-07-30 NOTE — Therapy (Signed)
[facility] [address] [place], [place], [address] Phone: [phone]   Fax:  [phone]  Speech Language Pathology Treatment  Patient Details  Name: Judy Spears MRN: [id number] Date of Birth: 01-15-1951 Referring Provider: Charlett Blake, MD  Encounter Date: 07/30/2016      End of Session - 07/30/16 1703    Visit Number 38   Number of Visits 45   Date for SLP Re-Evaluation 09/10/16   SLP Start Time 1532   SLP Stop Time  6222   SLP Time Calculation (min) 45 min   Activity Tolerance Patient tolerated treatment well      Past Medical History:  Diagnosis Date  . Arthritis    lt great toe  . Complication of anesthesia    pt has had headaches post op-did advise to hydra well preop  . Hair loss    right-sided  . History of exercise stress test    a. ETT (12/15) with 12:00 exercise, no ST changes, occasional PACs.   . Hyperlipidemia   . Insomnia   . Migraine headache   . Movement disorder    resltess in left legs  . Postmenopausal   . 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 Premature atrial contractions    a. Holter (12/15) with 8% PACs, no atrial runs or atrial fibrillation.     Past Surgical History:  Procedure Laterality Date  . BREAST BIOPSY  01/01/2011   Procedure: BREAST BIOPSY WITH NEEDLE LOCALIZATION;  Surgeon: Rolm Bookbinder, MD;  Location: [facility];  Service: General;  Laterality: Left;  left breast wire localization biopsy  . BREAST BIOPSY Right 07/27/2012  . BREAST BIOPSY Right 07/07/2016  . BREAST BIOPSY Left 06/30/2016  . cataracts right eye    . CESAREAN SECTION  1986  . HERNIA REPAIR  2000   RIH  . opirectinal membrane peel    . PEG PLACEMENT    . RHINOPLASTY  1983  . TRACHEOSTOMY      There were no vitals filed for this visit.      Subjective Assessment - 07/30/16 1541    Subjective Pt brought inspiratory and expiratory devices.    Special Tests spouse, [name];                ADULT SLP TREATMENT - 07/30/16 0001      General Information   Behavior/Cognition Alert;Cooperative;Pleasant mood     Treatment Provided   Treatment provided Cognitive-Linquistic     Cognitive-Linquistic Treatment   Treatment focused on Voice;Dysarthria   Skilled Treatment Pt did not work with req'd tactile and verbal cues for arousal today x2, no eye contact during Q&A with SLP. Pt with what appeared to be incr'd emission from trach today. SLP targeted pt's breath support for basic speech ability. Inspiratory device used beginning at 13 cmH2O, but pt had much difficulty with sequencing exhalation first, then inhalation. SLP again used max cues (demo with expiration of all air and then quick inhalation) before each repetition. Because lack of success with sequencing for25 rep trials, SLP abandoned respiratory muscle training and instead focused on timing of breathing and vocal adduction/voicing for 1-word responses in Q&A. Max/total A was necessary for pt to achieve 40-50% in functional responses. Many of pt's utterances were ill-timed. SLP further backed down speech heirarchy at that time and just had pt practice automatic speech tasks with breathing/voicing timing practice. Once pt success was >80% SLP moved back into Q&A with "yes/no" response necessary. Voicing  was functional 90% of the time, and yes/no was correct 100%. Given that success, SLP worked with "Stamps" questions (personal) next and pt's success with functional voicing decr'd again to <50%, with mod-max cues.     Tracheostomy Shiley 4 mm Uncuffed   Tracheostomy Properties Placement Date: 05/08/16 Placement Time: 1030 Placed By: ICU physician Brand: Shiley Size (mm): 4 mm Style: Uncuffed     Assessment / Recommendations / Plan   Plan Continue with current plan of care     Progression Toward Goals   Progression toward goals --          SLP Education - 07/29/16 0908    Education provided Yes   Education  Details inspiratory reps with repiratory muscle trainer   Person(s) Educated Patient;Spouse;Caregiver(s)   Methods Explanation;Demonstration;Verbal cues   Comprehension Verbalized understanding;Returned demonstration;Verbal cues required;Need further instruction;Tactile cues required          SLP Short Term Goals - 07/13/16 1150      SLP SHORT TERM GOAL #1   Title Patient will utilize intelligibility strategies with moderate visual cues from communication partner/visual aid to communicate 5-7 word wants, needs, thoughts and ideas.   Status Achieved     SLP SHORT TERM GOAL #2   Title Patient will utilize intelligibility strategies, breath support with mod A for phrase repetition in 75% of trials.   Status Achieved     SLP SHORT TERM GOAL #3   Title Pt will swallow thin liquids, purees and soft solids in 9/10 trials with no overt signs of aspiration.   Status Achieved     SLP SHORT TERM GOAL #4   Title Patient will tolerate 6oz water, >95% with no overt signs of aspiration.   Status Achieved     SLP SHORT TERM GOAL #5   Title Patient will utilize verbal cues for lip closure, dry swallow for secretion management in 75% of opportunities.   Status Achieved          SLP Long Term Goals - 07/30/16 1705      SLP LONG TERM GOAL #1   Title Patient will utilize multimodal communication/AAC to clarify wants, needs, thoughts or ideas during communication breakdowns with max A   Status Deferred     SLP LONG TERM GOAL #2   Title Patient will utilize intelligibility strategies, breath support with min A visual cues for 5-7 word responses with 80% accuracy.   Baseline renewed 07-30-14 for week of 08-02-16   Time 5   Period Weeks   Status On-going     SLP LONG TERM GOAL #3   Title Patient will demonstrate readiness for MBS to determine safety for pleasure feeds by timely initiation of swallow in 3/5 trials with ice chips/H20.   Status Achieved     SLP LONG TERM GOAL #4   Title Pt will  consume pleasure feeds of desired consistency (soft solids and thin liquids) with signs of aspiration in fewer than 10% of trials with caregiver providing moderate cues and monitoring for overt signs of aspiration.   Baseline renewed 07-29-16 for week of 08-02-16   Time 5   Period Weeks   Status On-going     SLP LONG TERM GOAL #5   Title Patient will utilize visual cue for lip closure, dry swallow for secretion management in 75% of opportunities.   Status Achieved     SLP LONG TERM GOAL #6   Title Pt will demo HEP for RMT, dysphagia with mod caregiver assistance.  Baseline renewed 07-29-16 for week of 08-02-16   Time 5   Period Weeks   Status On-going          Plan - 07/30/16 1703    Clinical Impression Statement Pt requires intermittent min cues x2 for alertness today; she is unable to complete RMT inspiratory training exercises due to inaccurate timing of expiration/quick inhalation. Pt has not been practicing with RMT due to being at vacation house. Pt, family and caregiver continue to demonstrate motivation to improve, Pt will benefit from continued skilled ST to address underlying physical impairments impacting swallowing and speech, as well as education and training in monitoring for and reducing risks of aspiration.   Speech Therapy Frequency 2x / week   Duration 4 weeks   Treatment/Interventions Aspiration precaution training;Trials of upgraded texture/liquids;Compensatory strategies;Functional tasks;Patient/family education;Cueing hierarchy;Diet toleration management by SLP;Cognitive reorganization;Compensatory techniques;SLP instruction and feedback;Internal/external aids;Multimodal communcation approach;Pharyngeal strengthening exercises   Potential to Achieve Goals Good   Potential Considerations Ability to learn/carryover information;Co-morbidities;Severity of impairments   SLP Home Exercise Plan Pleasure feeds purees, thin liquids, strict oral care before and after PO, monitor for  signs of fatigue, aspiration. RMT   Consulted and Agree with Plan of Care Family member/caregiver;Patient   Family Member Consulted husband chip, caregiver lisa      Patient will benefit from skilled therapeutic intervention in order to improve the following deficits and impairments:   Dysarthria and anarthria  Dysphagia, oropharyngeal phase  Cognitive communication deficit    Problem List Patient Active Problem List   Diagnosis Date Noted  . Malignant neoplasm of upper-inner quadrant of left breast in female, estrogen receptor positive ([facility]) 07/14/2016  . Heterotopic ossification of bone 03/10/2016  . Acute on chronic respiratory failure with hypoxia ([place])   . Acute hypoxemic respiratory failure ([facility])   . Acute blood loss anemia   . Atelectasis 02/04/2016  . Debility 02/03/2016  . Pneumonia of both lower lobes due to Pseudomonas species ([facility])   . Pneumonia of both lower lobes due to methicillin susceptible Staphylococcus aureus (MSSA) ([place])   . Acute respiratory failure with hypoxia ([place])   . Acute encephalopathy   . Seizures ([place])   . Sepsis ([place])   . Hypoxia   . Pain   . HCAP (healthcare-associated pneumonia) 12/28/2015  . Chronic respiratory failure ([place]) 12/28/2015  . Acute tracheobronchitis 12/24/2015  . Tracheostomy tube present ([place])   . Tracheal stenosis   . Chronic respiratory failure with hypoxia ([place])   . Spastic tetraplegia ([place]) 10/20/2015  . Tracheostomy status ([place]) 09/26/2015  . Allergic rhinitis 09/26/2015  . 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 Movement disorder 09/22/2015  . Encephalitis 09/22/2015  . Chest pain 02/07/2014  . Premature atrial contractions 01/13/2014  . Abnormality of gait 12/04/2013  . Hyperlipidemia 12/21/2011  . Cardiovascular risk factor 12/21/2011  . Bunion 01/31/2008  . Metatarsalgia of both feet 09/20/2007  . FLAT FOOT 09/20/2007  . HALLUX RIGIDUS, ACQUIRED 09/20/2007    Judy Spears ,Judy Spears,  CCC-SLP  07/30/2016, 5:06 PM  [facility] [address] [place], [place], [address] Phone: [phone]   Fax:  [phone]   Name: Judy Spears MRN: [id number] Date of Birth: [birth year]

## 2016-08-02 ENCOUNTER — Ambulatory Visit: Payer: Medicare Other

## 2016-08-02 ENCOUNTER — Encounter: Payer: Self-pay | Admitting: Rehabilitation

## 2016-08-02 ENCOUNTER — Ambulatory Visit: Payer: Medicare Other | Admitting: Occupational Therapy

## 2016-08-02 ENCOUNTER — Ambulatory Visit: Payer: Medicare Other | Admitting: Adult Health

## 2016-08-02 ENCOUNTER — Ambulatory Visit: Payer: Medicare Other | Admitting: Rehabilitation

## 2016-08-02 ENCOUNTER — Encounter: Payer: Self-pay | Admitting: Occupational Therapy

## 2016-08-02 ENCOUNTER — Ambulatory Visit: Payer: Medicare Other | Admitting: Acute Care

## 2016-08-02 DIAGNOSIS — M25622 Stiffness of left elbow, not elsewhere classified: Secondary | ICD-10-CM

## 2016-08-02 DIAGNOSIS — R41841 Cognitive communication deficit: Secondary | ICD-10-CM

## 2016-08-02 DIAGNOSIS — R1312 Dysphagia, oropharyngeal phase: Secondary | ICD-10-CM

## 2016-08-02 DIAGNOSIS — M6281 Muscle weakness (generalized): Secondary | ICD-10-CM

## 2016-08-02 DIAGNOSIS — R29818 Other symptoms and signs involving the nervous system: Secondary | ICD-10-CM

## 2016-08-02 DIAGNOSIS — R293 Abnormal posture: Secondary | ICD-10-CM

## 2016-08-02 DIAGNOSIS — R2681 Unsteadiness on feet: Secondary | ICD-10-CM

## 2016-08-02 DIAGNOSIS — R2689 Other abnormalities of gait and mobility: Secondary | ICD-10-CM

## 2016-08-02 DIAGNOSIS — M25611 Stiffness of right shoulder, not elsewhere classified: Secondary | ICD-10-CM

## 2016-08-02 DIAGNOSIS — R41844 Frontal lobe and executive function deficit: Secondary | ICD-10-CM

## 2016-08-02 DIAGNOSIS — M25612 Stiffness of left shoulder, not elsewhere classified: Secondary | ICD-10-CM

## 2016-08-02 DIAGNOSIS — R471 Dysarthria and anarthria: Secondary | ICD-10-CM

## 2016-08-02 NOTE — Therapy (Signed)
[facility] [address] [place], [place], [address] Phone: [phone]   Fax:  [phone]  Occupational Therapy Treatment  Patient Details  Name: Judy Spears MRN: [id number] Date of Birth: [birth year] Referring Provider: Dr. Quita Skye. [name]  Encounter Date: 08/02/2016      OT End of Session - 08/02/16 1651    Visit Number 41   Number of Visits 75   Date for OT Re-Evaluation 09/07/16   Authorization Type [employer] unlimited visits   OT Start Time 1401   OT Stop Time 4132   OT Time Calculation (min) 44 min      Past Medical History:  Diagnosis Date  . Arthritis    lt great toe  . Complication of anesthesia    pt has had headaches post op-did advise to hydra well preop  . Hair loss    right-sided  . History of exercise stress test    a. ETT (12/15) with 12:00 exercise, no ST changes, occasional PACs.   . Hyperlipidemia   . Insomnia   . Migraine headache   . Movement disorder    resltess in left legs  . Postmenopausal   . 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 Premature atrial contractions    a. Holter (12/15) with 8% PACs, no atrial runs or atrial fibrillation.     Past Surgical History:  Procedure Laterality Date  . BREAST BIOPSY  01/01/2011   Procedure: BREAST BIOPSY WITH NEEDLE LOCALIZATION;  Surgeon: Rolm Bookbinder, MD;  Location: [facility];  Service: General;  Laterality: Left;  left breast wire localization biopsy  . BREAST BIOPSY Right 07/27/2012  . BREAST BIOPSY Right 07/07/2016  . BREAST BIOPSY Left 06/30/2016  . cataracts right eye    . CESAREAN SECTION  1986  . HERNIA REPAIR  2000   RIH  . opirectinal membrane peel    . PEG PLACEMENT    . RHINOPLASTY  1983  . TRACHEOSTOMY      There were no vitals filed for this visit.      Subjective Assessment - 08/02/16 1407    Subjective  YES, when asked if she could feel where her weight is on her feet.   Patient is accompained by:  Family member  husband [name], caregiver [name]   Pertinent History see epic snapshot - ABI/encephalitis/hypoxia from virus; recent hospitalization due to ARF;    Patient Stated Goals Pt unable to state.    Currently in Pain? No/denies                      OT Treatments/Exercises (OP) - 08/02/16 0001      Neurological Re-education Exercises   Other Exercises 1 Neuro re ed to address active sit to stand, static and dynamic standing balance, alternating and reciprocal movements of LE's requiring weight shifting, using weight on feet to determine where pt's center of gravity is. Pt with signifcant perceptual, vestibular and visual deficits however pt can verbalize proprioceptive input and can adjust based on what she senses through her feet with max cueing and increased time. WIll continue to incorporate.                   OT Short Term Goals - 08/02/16 1648      OT SHORT TERM GOAL  #10   TITLE Pt will be min a stand step toilet transfers at least 50% of the time using either grab bar or walker for community transfers  Status Achieved     OT SHORT TERM GOAL  #11   TITLE Pt will consistently move from sit to stand with no more than min a for functional mobility   Status On-going     OT SHORT TERM GOAL  #12   TITLE Pt will be no more than mod a consistently for bathing at shower level   Status On-going           OT Long Term Goals - 08/02/16 1649      OT LONG TERM GOAL #1   Title Pt, husband and caregiver will be mod I with upgraded HEP prn - 04/26/2016   Status Achieved     OT LONG TERM GOAL #2   Title Pt, husband and caregiver will be mod I with splint wear and care    Status Achieved     OT LONG TERM GOAL #3   Title Pt will be able to wash face with min a only   Status Achieved     OT LONG TERM GOAL #4   Title Pt will be able to sit without UE support with supevision 100% of the time with no more than 2 vc's while engaged in functional activity.    Status Achieved     OT LONG TERM GOAL #5   Title Pt, husband and caregiver will verbalize understanding of potential after care therapeutic opportunities. - 07/08/2016 date adjusted as pt missed 2 weeks due to hospitalization   Status Achieved     OT LONG TERM GOAL #6   Title Pt will be supervision to wash face consistently   Status Achieved     OT LONG TERM GOAL #7   Title Pt will don shirt with min a and max vc's 50% of the time   Status Achieved     OT LONG TERM GOAL #8   Title Pt will require min a for stand step transfers to toilet using grab bar or walker for community toileting at least 75% of the time. - 09/06/2016   Status On-going     OT LONG TERM GOAL  #9   Baseline Pt will stand with contact gurard with only 1 UE support for static standing balance in prep for abiilty to engage in functional task while standing.    Status Achieved     OT LONG TERM GOAL  #10   TITLE Pt will consistenly move from sit to stand with no more than CGA while caregiver assists with pulling up pants.    Status On-going     OT LONG TERM GOAL  #11   TITLE Pt will require no more than close supervision for static standing with bilateral UE while caregiver assists with hiking pants.    Status On-going     OT LONG TERM GOAL  #12   TITLE Pt will go from stand to sit during ADL activities with no more than min a.    Status On-going               Plan - 08/02/16 1649    Clinical Impression Statement Pt continues to show very slow but steady improvement. Caregiver reports that two times this past week they were able to use public bathrooms with grab bar and pt needed only min a for transfers.    Rehab Potential Fair   Current Impairments/barriers affecting progress: severity of deficits, very slow rate of recovery; pt recently diagnosed with breast cancer - will monitor ability to tolerate therapy  OT Frequency 2x / week   OT Duration 8 weeks   OT Treatment/Interventions Self-care/ADL  training;Ultrasound;Moist Heat;Electrical Stimulation;DME and/or AE instruction;Neuromuscular education;Therapeutic exercise;Functional Mobility Training;Manual Therapy;Passive range of motion;Splinting;Therapeutic activities;Balance training;Patient/family education;Visual/perceptual remediation/compensation;Cognitive remediation/compensation   Plan NMR trunk/UE's, functional mobility, sit to stand, postural orientation/alignment/control in kneeling and standing, use of proprioceptive input at feet to find midline.   Consulted and Agree with Plan of Care Patient;Family member/caregiver   Family Member Consulted caregiver [name] , husband [name]      Patient will benefit from skilled therapeutic intervention in order to improve the following deficits and impairments:  Decreased endurance, Decreased skin integrity, Impaired vision/preception, Improper body mechanics, Impaired perceived functional ability, Decreased knowledge of precautions, Cardiopulmonary status limiting activity, Decreased activity tolerance, Decreased knowledge of use of DME, Decreased strength, Impaired flexibility, Improper spinal/pelvic alignment, Impaired sensation, Difficulty walking, Decreased mobility, Decreased balance, Decreased cognition, Decreased range of motion, Impaired tone, Impaired UE functional use, Decreased safety awareness, Decreased coordination, Pain  Visit Diagnosis: Unsteadiness on feet  Muscle weakness (generalized)  Abnormal posture  Other symptoms and signs involving the nervous system  Stiffness of left shoulder, not elsewhere classified  Stiffness of right shoulder, not elsewhere classified  Stiffness of left elbow, not elsewhere classified  Frontal lobe and executive function deficit    Problem List Patient Active Problem List   Diagnosis Date Noted  . Malignant neoplasm of upper-inner quadrant of left breast in female, estrogen receptor positive ([facility]) 07/14/2016  . Heterotopic  ossification of bone 03/10/2016  . Acute on chronic respiratory failure with hypoxia ([place])   . Acute hypoxemic respiratory failure ([place])   . Acute blood loss anemia   . Atelectasis 02/04/2016  . Debility 02/03/2016  . Pneumonia of both lower lobes due to Pseudomonas species ([facility])   . Pneumonia of both lower lobes due to methicillin susceptible Staphylococcus aureus (MSSA) ([facility])   . Acute respiratory failure with hypoxia ([facility] [place])   . Acute encephalopathy   . Seizures ([place])   . Sepsis ([place])   . Hypoxia   . Pain   . HCAP (healthcare-associated pneumonia) 12/28/2015  . Chronic respiratory failure ([place]) 12/28/2015  . Acute tracheobronchitis 12/24/2015  . Tracheostomy tube present ([place])   . Tracheal stenosis   . Chronic respiratory failure with hypoxia ([place])   . Spastic tetraplegia ([place]) 10/20/2015  . Tracheostomy status ([place]) 09/26/2015  . Allergic rhinitis 09/26/2015  . 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 Movement disorder 09/22/2015  . Encephalitis 09/22/2015  . Chest pain 02/07/2014  . Premature atrial contractions 01/13/2014  . Abnormality of gait 12/04/2013  . Hyperlipidemia 12/21/2011  . Cardiovascular risk factor 12/21/2011  . Bunion 01/31/2008  . Metatarsalgia of both feet 09/20/2007  . FLAT FOOT 09/20/2007  . HALLUX RIGIDUS, ACQUIRED 09/20/2007    Quay Burow, OTR/L 08/02/2016, 4:52 PM  [facility] [address] [place], [place], [address] Phone: [phone]   Fax:  [phone]  Name: ANISA LEANOS MRN: [id number] Date of Birth: 19-Feb-1950

## 2016-08-02 NOTE — Patient Instructions (Signed)
Continue to practice respiratory muscle training at home focusing on inspiratory device.

## 2016-08-02 NOTE — Therapy (Signed)
[facility] [address] [place], [place], [address] Phone: [phone]   Fax:  [phone]  Physical Therapy Treatment  Patient Details  Name: Judy Spears MRN: [id number] Date of Birth: [birth year] Referring Provider: Alger Simons, MD/Judy Letta Pate, MD  Encounter Date: 08/02/2016      PT End of Session - 08/02/16 1554    Visit Number 40   Number of Visits 48   Date for PT Re-Evaluation 08/23/16   Authorization Type BC[employer]   PT Start Time 1445   PT Stop Time 1529   PT Time Calculation (min) 44 min   Activity Tolerance Patient tolerated treatment well   Behavior During Therapy [facility] for tasks assessed/performed      Past Medical History:  Diagnosis Date  . Arthritis    lt great toe  . Complication of anesthesia    pt has had headaches post op-did advise to hydra well preop  . Hair loss    right-sided  . History of exercise stress test    a. ETT (12/15) with 12:00 exercise, no ST changes, occasional PACs.   . Hyperlipidemia   . Insomnia   . Migraine headache   . Movement disorder    resltess in left legs  . Postmenopausal   . 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 Premature atrial contractions    a. Holter (12/15) with 8% PACs, no atrial runs or atrial fibrillation.     Past Surgical History:  Procedure Laterality Date  . BREAST BIOPSY  01/01/2011   Procedure: BREAST BIOPSY WITH NEEDLE LOCALIZATION;  Surgeon: Judy Bookbinder, MD;  Location: [facility];  Service: General;  Laterality: Left;  left breast wire localization biopsy  . BREAST BIOPSY Right 07/27/2012  . BREAST BIOPSY Right 07/07/2016  . BREAST BIOPSY Left 06/30/2016  . cataracts right eye    . CESAREAN SECTION  1986  . HERNIA REPAIR  2000   RIH  . opirectinal membrane peel    . PEG PLACEMENT    . RHINOPLASTY  1983  . TRACHEOSTOMY      There were no vitals filed for this visit.      Subjective Assessment - 08/02/16 1553    Subjective Caregiver Judy Spears reports that she did a min A toilet transfer today at MD appt.    Patient is accompained by: Family member   Pertinent History Precautions:  PEG.  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  Another round of botox in LLE on 02/06/16   Limitations House hold activities;Walking;Standing;Sitting   Patient Stated Goals Per husband, "For Judy Spears to be as independent as possible."   Currently in Pain? No/denies              NMR:  In // bars worked on [employer] for improved postural control and improved L lateral weight shift.  Had pt utilize BUE support and use of mirror for increased feedback while moving RLE in space (kicking motion) progressing to marching R LE up/down, and also having pt place R LE onto soccer ball to increase L lateral weight shift and LLE WB.  Note that each task is broken down in order to better emphasize sensory system for her to feel and understand placement of weight.  For example, tasks were broken down to provide cues for posterior weight shift to heels, lateral weight  shift, correction of trunk and head posture then progressing to moving RLE for task.  Note she is able to obtain L lateral weight shift, however maintaining this position is very difficult esp with soccer ball task.    Gait:  Ended session with gait x 50' with RW at min/mod A level with step by step cuing at each step for finding balance, improving posture and improving weight shift with forward hip protraction then advancing opposite limb.  Pt doing much better guiding RW, however note that with increased fatigue, she continues to need assist to prevent too much forward translation of RW.                     PT Education - 08/02/16 1554    Education provided Yes   Education Details  beginning to ambulate at home with regular RW instead of platforms   Person(s) Educated Patient;Spouse;Caregiver(s)   Methods Explanation   Comprehension Verbalized understanding          PT Short Term Goals - 07/22/16 1322      PT SHORT TERM GOAL #1   Title Jay Back on pt's current manual wheelchair to be switched out to regular back; pt will demonstrate abilty to sit in wheelchair with upright with less supportive wheelchair back.  07-24-16   Baseline Jay Back on current wheelchair   Time 4   Period Weeks   Status Achieved     PT SHORT TERM GOAL #2   Title Pt will perform sit to stand from wheelchair to RW with CGA.  07-24-16   Baseline min/guard to min A 07/22/16   Time 4   Period Weeks   Status Partially Met     PT SHORT TERM GOAL #3   Title Family to obtain bil. platform RW to begin assisting pt with ambulation in her home environment.  07-24-16   Baseline met with B PFRW 07/22/16   Time 4   Period Weeks   Status Achieved     PT SHORT TERM GOAL #4   Title Family will report pt rolling from supine to Rt and Lt sides in hospital bed at home with supervision without use of bed rail with cues for positioning of LE's.  07-24-16   Baseline pt needs min assist at times - activity just demonstrated by pt on 06-24-16   Time 4   Period Weeks   Status New     PT SHORT TERM GOAL #5   Title Basic transfers from wheelchair to bed or commode with CGA.  07-24-16   Baseline min A 07/22/16 with RW   Time 4   Period Weeks   Status Partially Met     PT SHORT TERM GOAL #6   Title Amb. 5' with bil. platform RW with CGA to begin amb. at home with assistance safely.  07-24-16   Baseline 115' with BPFRW and now RW with min A 07/22/16   Time 4   Period Weeks   Status Partially Met           PT Long Term Goals - 06/25/16 1142      PT LONG TERM GOAL #1   Title Pt will perform supine rolling R and L at mod I level in order to indicate improved independence with bed mobility.  (Target  Date: 06/25/16)/ 08-23-16  CONT. LTG til 08-23-16   Baseline inconsistent in performance - pt needs cues for LE flexion to assist with pushing for rolling and cues  for UE positioning   Time 8   Period Weeks   Status On-going     PT LONG TERM GOAL #2   Title Pt will perform sit>SL R or L modified independently in order to indicate improved independence with getting into bed.   08-23-16   Baseline min to mod assist needed - 06-24-16   Time 8   Status New     PT LONG TERM GOAL #3   Title Pt will transfer Lt/Rt sidelying to sitting with CGA for incr. independence with getting out of bed.  08-23-16   Baseline min to mod assist needed -06-24-16   Time 8   Period Weeks   Status New     PT LONG TERM GOAL #4   Title Pt will perform basic transfers with CGA to indicate improved functional independence.  08-23-16   Baseline min assist needed - 06-24-16   Time 8   Period Weeks   Status New     PT LONG TERM GOAL #5   Title Pt will perform static standing with bil. UE support with distant supervision for >/= 10" for improved standing balance.  08-23-16   Baseline close SBA to CGA needed - pt standing with bil UE support for 8-10" per caregiver's report -- 06-24-16   Time 8   Period Weeks   Status New     Additional Long Term Goals   Additional Long Term Goals Yes     PT LONG TERM GOAL #6   Title Pt will demonstrate ability to ambulate up to 25' w/ LRAD at min A level in order to assist with getting around home.  CONT LTG til 08-23-16; goal upgraded to 50'   Baseline bil. platform RW to be used in home   Time 8   Period Weeks   Status On-going     PT LONG TERM GOAL #7   Title Perform sit to stand and stand to sit to RW or to another object with CGA.  08-23-16   Baseline min assist needed - 06-24-16   Time 8   Period Weeks   Status New               Plan - 08/02/16 1555    Clinical Impression Statement Skilled session focused on NMR for improved postural control while improving weight  shift to the L.  Also continue to work on gait with RW and recommend they begin doing this at home.     Rehab Potential Fair   Clinical Impairments Affecting Rehab Potential Chronicity of condition, continued medical management, but has very strong family and financial support   PT Frequency 2x / week   PT Duration 8 weeks   PT Treatment/Interventions ADLs/Self Care Home Management;DME Instruction;Gait training;Stair training;Functional mobility training;Therapeutic activities;Patient/family education;Cognitive remediation;Neuromuscular re-education;Therapeutic exercise;Balance training;Orthotic Fit/Training;Wheelchair mobility training;Manual techniques;Splinting;Visual/perceptual remediation/compensation;Vestibular;Passive range of motion;Electrical Stimulation   PT Next Visit Plan cont gait, balance, transfer, bed mobility training; try technique for upright posture in standing (if pt able to tolerate)   Consulted and Agree with Plan of Care Patient;Family member/caregiver   Family Member Consulted caregiver, [name] and husband [name]      Patient will benefit from skilled therapeutic intervention in order to improve the following deficits and impairments:  Abnormal gait, Decreased balance, Decreased endurance, Decreased cognition, Cardiopulmonary status limiting activity, Decreased activity tolerance, Decreased coordination, Decreased strength, Impaired flexibility, Impaired tone, Decreased mobility, Increased muscle spasms, Impaired vision/preception, Decreased range of motion, Impaired UE functional use, Postural dysfunction, Improper  body mechanics, Impaired perceived functional ability, Decreased knowledge of precautions, Decreased knowledge of use of DME  Visit Diagnosis: Unsteadiness on feet  Muscle weakness (generalized)  Abnormal posture  Other symptoms and signs involving the nervous system  Other abnormalities of gait and mobility     Problem List Patient Active Problem List    Diagnosis Date Noted  . Malignant neoplasm of upper-inner quadrant of left breast in female, estrogen receptor positive ([place]) 07/14/2016  . Heterotopic ossification of bone 03/10/2016  . Acute on chronic respiratory failure with hypoxia ([facility])   . Acute hypoxemic respiratory failure ([place])   . Acute blood loss anemia   . Atelectasis 02/04/2016  . Debility 02/03/2016  . Pneumonia of both lower lobes due to Pseudomonas species ([place])   . Pneumonia of both lower lobes due to methicillin susceptible Staphylococcus aureus (MSSA) ([place])   . Acute respiratory failure with hypoxia ([place])   . Acute encephalopathy   . Seizures ([place])   . Sepsis ([place])   . Hypoxia   . Pain   . HCAP (healthcare-associated pneumonia) 12/28/2015  . Chronic respiratory failure ([place] [place]) 12/28/2015  . Acute tracheobronchitis 12/24/2015  . Tracheostomy tube present ([place])   . Tracheal stenosis   . Chronic respiratory failure with hypoxia ([place])   . Spastic tetraplegia ([place]) 10/20/2015  . Tracheostomy status ([place]) 09/26/2015  . Allergic rhinitis 09/26/2015  . 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 Movement disorder 09/22/2015  . Encephalitis 09/22/2015  . Chest pain 02/07/2014  . Premature atrial contractions 01/13/2014  . Abnormality of gait 12/04/2013  . Hyperlipidemia 12/21/2011  . Cardiovascular risk factor 12/21/2011  . Bunion 01/31/2008  . Metatarsalgia of both feet 09/20/2007  . FLAT FOOT 09/20/2007  . HALLUX RIGIDUS, ACQUIRED 09/20/2007    Cameron Sprang, PT, MPT [facility] [address] [place], [place], [address] Phone: [phone]   Fax:  [phone] 08/02/16, 4:02 PM  Name: Judy Spears MRN: [id number] Date of Birth: Jun 14, 1950

## 2016-08-02 NOTE — Therapy (Signed)
[facility] [address] [place], [place], [address] Phone: [phone]   Fax:  [phone]  Speech Language Pathology Treatment  Patient Details  Name: Judy Spears MRN: [id number] Date of Birth: 10-16-1950 Referring Provider: Charlett Blake, MD  Encounter Date: 08/02/2016      End of Session - 08/02/16 1703    Visit Number 39   Number of Visits 46   Date for SLP Re-Evaluation 09/10/16   SLP Start Time 1533   SLP Stop Time  1615   SLP Time Calculation (min) 42 min   Activity Tolerance Patient tolerated treatment well      Past Medical History:  Diagnosis Date  . Arthritis    lt great toe  . Complication of anesthesia    pt has had headaches post op-did advise to hydra well preop  . Hair loss    right-sided  . History of exercise stress test    a. ETT (12/15) with 12:00 exercise, no ST changes, occasional PACs.   . Hyperlipidemia   . Insomnia   . Migraine headache   . Movement disorder    resltess in left legs  . Postmenopausal   . 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 Premature atrial contractions    a. Holter (12/15) with 8% PACs, no atrial runs or atrial fibrillation.     Past Surgical History:  Procedure Laterality Date  . BREAST BIOPSY  01/01/2011   Procedure: BREAST BIOPSY WITH NEEDLE LOCALIZATION;  Surgeon: Rolm Bookbinder, MD;  Location: [facility];  Service: General;  Laterality: Left;  left breast wire localization biopsy  . BREAST BIOPSY Right 07/27/2012  . BREAST BIOPSY Right 07/07/2016  . BREAST BIOPSY Left 06/30/2016  . cataracts right eye    . CESAREAN SECTION  1986  . HERNIA REPAIR  2000   RIH  . opirectinal membrane peel    . PEG PLACEMENT    . RHINOPLASTY  1983  . TRACHEOSTOMY      There were no vitals filed for this visit.      Subjective Assessment - 08/02/16 1538    Subjective Pt did not bring inspiratory and expiratory devices today.    Patient is accompained  by: --  husband [name] and [name] caregiver   Currently in Pain? No/denies               ADULT SLP TREATMENT - 08/02/16 1544      General Information   Behavior/Cognition Alert;Cooperative;Pleasant mood  somnolent at times     Treatment Provided   Treatment provided Dysphagia     Dysphagia Treatment   Temperature Spikes Noted No   Respiratory Status Room air   Oral Cavity - Dentition Adequate natural dentition   Treatment Methods Skilled observation;Patient/caregiver education;Compensation strategy training   Patient observed directly with PO's Yes   Type of PO's observed Thin liquids   Feeding Needs assist   Oral Phase Signs & Symptoms Anterior loss/spillage   Pharyngeal Phase Signs & Symptoms Multiple swallows;Wet vocal quality;Delayed cough   Type of cueing Verbal;Visual;Tactile   Other treatment/comments SLP facilitated pt's practice with thin liquids for A patient with thin liquid administration and to monitor use of compensatory strategies (specifically ensuring labial closure prior to swallow, small sips, and multiple sips) and caregiver for training for use. SLP begain with thorough oral care, and informing caregiver [name] that pt's motor sequencing of inspiratory device last session was difficult however to cont to attempt at home if  this occurs. SLP worked with pt's nosey cup to improve safety of use of that device with thin liquids. SLP req'd to cue pt consistently for small sip. Cue to "pinch lips" consistently needed (approx 80% of the time) or anterior labial leakage would result.  SLP found that if cup was held to pt mouth for approx 1 second after sip pt's lips remained sealed and labial leakage did not occur. SLP told this to husband and caregiver. "Wet" voice occurrred after additional swallow/s approx 40% of the time cleared with additional swallow. Pt coughed (delayed) after1/20 boluses. At end of session told husband that renewal had been completed for 4 weeks.      Tracheostomy Shiley 4 mm Uncuffed   Tracheostomy Properties Placement Date: 05/08/16 Placement Time: 1030 Placed By: ICU physician Brand: Shiley Size (mm): 4 mm Style: Uncuffed     Assessment / Recommendations / Plan   Plan Continue with current plan of care     Dysphagia Recommendations   Diet recommendations Dysphagia 1 (puree);Thin liquid   Liquids provided via Teaspoon;Cup   Medication Administration Via alternative means   Supervision Full supervision/cueing for compensatory strategies   Compensations Effortful swallow;Multiple dry swallows after each bite/sip;Check for anterior loss;Small sips/bites;Slow rate;Check for pocketing   Postural Changes and/or Swallow Maneuvers Seated upright 90 degrees     Progression Toward Goals   Progression toward goals --  pt appears motivated for progress     SLP told husband that 4 more weeks of therapy may well suffice for pt at this time mostly in order for he and caregiver/s to hone their skills, with SLP A, for following a home exercise program after any possible d/c of ST in the next four weeks.       SLP Short Term Goals - 07/13/16 1150      SLP SHORT TERM GOAL #1   Title Patient will utilize intelligibility strategies with moderate visual cues from communication partner/visual aid to communicate 5-7 word wants, needs, thoughts and ideas.   Status Achieved     SLP SHORT TERM GOAL #2   Title Patient will utilize intelligibility strategies, breath support with mod A for phrase repetition in 75% of trials.   Status Achieved     SLP SHORT TERM GOAL #3   Title Pt will swallow thin liquids, purees and soft solids in 9/10 trials with no overt signs of aspiration.   Status Achieved     SLP SHORT TERM GOAL #4   Title Patient will tolerate 6oz water, >95% with no overt signs of aspiration.   Status Achieved     SLP SHORT TERM GOAL #5   Title Patient will utilize verbal cues for lip closure, dry swallow for secretion management in 75% of  opportunities.   Status Achieved          SLP Long Term Goals - 08/02/16 1708      SLP LONG TERM GOAL #1   Title Patient will utilize multimodal communication/AAC to clarify wants, needs, thoughts or ideas during communication breakdowns with max A   Status Deferred     SLP LONG TERM GOAL #2   Title Patient will utilize intelligibility strategies, breath support with min A visual cues for 5-7 word responses with 80% accuracy.   Baseline renewed 07-30-14 for week of 08-02-16   Time 4   Period Weeks   Status On-going     SLP LONG TERM GOAL #3   Title Patient will demonstrate readiness for MBS to  determine safety for pleasure feeds by timely initiation of swallow in 3/5 trials with ice chips/H20.   Status Achieved     SLP LONG TERM GOAL #4   Title Pt will consume pleasure feeds of desired consistency (soft solids and thin liquids) with signs of aspiration in fewer than 10% of trials with caregiver providing moderate cues and monitoring for overt signs of aspiration.   Baseline renewed 07-29-16 for week of 08-02-16   Time 4   Period Weeks   Status On-going     SLP LONG TERM GOAL #5   Title Patient will utilize visual cue for lip closure, dry swallow for secretion management in 75% of opportunities.   Status Achieved     SLP LONG TERM GOAL #6   Title Pt will demo HEP for RMT, dysphagia with mod caregiver assistance.   Baseline renewed 07-29-16 for week of 08-02-16   Time 4   Period Weeks   Status On-going          Plan - 08/02/16 1706    Clinical Impression Statement Pt required intermittent min cues again x2 for alertness today. Informed caregiver pt was unable to complete RMT inspiratory training exercises last session but told caregiver if this happens at home to set aside and try again later with pt. Dysphagia was targeted in today's session - see "skilled treatment" for details. Pt will benefit from continued skilled ST to address underlying physical impairments impacting  swallowing and speech, as well as education and training in monitoring for and reducing risks of aspiration.   Speech Therapy Frequency 2x / week   Duration 4 weeks   Treatment/Interventions Aspiration precaution training;Trials of upgraded texture/liquids;Compensatory strategies;Functional tasks;Patient/family education;Cueing hierarchy;Diet toleration management by SLP;Cognitive reorganization;Compensatory techniques;SLP instruction and feedback;Internal/external aids;Multimodal communcation approach;Pharyngeal strengthening exercises   Potential to Achieve Goals Good   Potential Considerations Ability to learn/carryover information;Co-morbidities;Severity of impairments   SLP Home Exercise Plan Pleasure feeds purees, thin liquids, strict oral care before and after PO, monitor for signs of fatigue, aspiration. RMT   Consulted and Agree with Plan of Care Family member/caregiver;Patient   Family Member Consulted husband chip, caregiver lisa      Patient will benefit from skilled therapeutic intervention in order to improve the following deficits and impairments:   Dysphagia, oropharyngeal phase  Dysarthria and anarthria  Cognitive communication deficit    Problem List Patient Active Problem List   Diagnosis Date Noted  . Malignant neoplasm of upper-inner quadrant of left breast in female, estrogen receptor positive ([place]) 07/14/2016  . Heterotopic ossification of bone 03/10/2016  . Acute on chronic respiratory failure with hypoxia ([place])   . Acute hypoxemic respiratory failure ([place])   . Acute blood loss anemia   . Atelectasis 02/04/2016  . Debility 02/03/2016  . Pneumonia of both lower lobes due to Pseudomonas species ([place])   . Pneumonia of both lower lobes due to methicillin susceptible Staphylococcus aureus (MSSA) ([place])   . Acute respiratory failure with hypoxia ([place])   . Acute encephalopathy   . Seizures ([place])   . Sepsis ([place])   . Hypoxia   . Pain   . HCAP  (healthcare-associated pneumonia) 12/28/2015  . Chronic respiratory failure ([place]) 12/28/2015  . Acute tracheobronchitis 12/24/2015  . Tracheostomy tube present ([place])   . Tracheal stenosis   . Chronic respiratory failure with hypoxia ([place])   . Spastic tetraplegia ([place]) 10/20/2015  . Tracheostomy status ([place]) 09/26/2015  . Allergic rhinitis 09/26/2015  . 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  Movement disorder 09/22/2015  . Encephalitis 09/22/2015  . Chest pain 02/07/2014  . Premature atrial contractions 01/13/2014  . Abnormality of gait 12/04/2013  . Hyperlipidemia 12/21/2011  . Cardiovascular risk factor 12/21/2011  . Bunion 01/31/2008  . Metatarsalgia of both feet 09/20/2007  . FLAT FOOT 09/20/2007  . HALLUX RIGIDUS, ACQUIRED 09/20/2007    [facility] ,[place], CCC-SLP  08/02/2016, 5:09 PM  [facility] [address] [place], [place], [address] Phone: [phone]   Fax:  [phone]   Name: JAZZELLE ZHANG MRN: [id number] Date of Birth: Dec 19, 1950

## 2016-08-05 ENCOUNTER — Encounter: Payer: Self-pay | Admitting: Rehabilitation

## 2016-08-05 ENCOUNTER — Ambulatory Visit: Payer: Medicare Other | Admitting: Occupational Therapy

## 2016-08-05 ENCOUNTER — Encounter: Payer: Self-pay | Admitting: Occupational Therapy

## 2016-08-05 ENCOUNTER — Ambulatory Visit: Payer: Medicare Other | Admitting: Speech Pathology

## 2016-08-05 ENCOUNTER — Ambulatory Visit: Payer: Medicare Other | Admitting: Rehabilitation

## 2016-08-05 DIAGNOSIS — M6281 Muscle weakness (generalized): Secondary | ICD-10-CM

## 2016-08-05 DIAGNOSIS — R2681 Unsteadiness on feet: Secondary | ICD-10-CM

## 2016-08-05 DIAGNOSIS — R1312 Dysphagia, oropharyngeal phase: Secondary | ICD-10-CM

## 2016-08-05 DIAGNOSIS — R41844 Frontal lobe and executive function deficit: Secondary | ICD-10-CM

## 2016-08-05 DIAGNOSIS — M25612 Stiffness of left shoulder, not elsewhere classified: Secondary | ICD-10-CM

## 2016-08-05 DIAGNOSIS — R29818 Other symptoms and signs involving the nervous system: Secondary | ICD-10-CM

## 2016-08-05 DIAGNOSIS — R471 Dysarthria and anarthria: Secondary | ICD-10-CM

## 2016-08-05 DIAGNOSIS — M25611 Stiffness of right shoulder, not elsewhere classified: Secondary | ICD-10-CM

## 2016-08-05 DIAGNOSIS — M25622 Stiffness of left elbow, not elsewhere classified: Secondary | ICD-10-CM

## 2016-08-05 DIAGNOSIS — R293 Abnormal posture: Secondary | ICD-10-CM

## 2016-08-05 NOTE — Therapy (Signed)
[facility] [address] [place], [place], [address] Phone: [phone]   Fax:  [phone]  Occupational Therapy Treatment  Patient Details  Name: Judy Spears MRN: [id number] Date of Birth: 1950-03-10 Referring Provider: Dr. Quita Skye. [name]  Encounter Date: 08/05/2016      OT End of Session - 08/05/16 1610    Visit Number 42   Number of Visits 21   Date for OT Re-Evaluation 09/07/16   Authorization Type [employer] unlimited visits   OT Start Time 9323   OT Stop Time 1531   OT Time Calculation (min) 44 min   Activity Tolerance Patient tolerated treatment well      Past Medical History:  Diagnosis Date  . Arthritis    lt great toe  . Complication of anesthesia    pt has had headaches post op-did advise to hydra well preop  . Hair loss    right-sided  . History of exercise stress test    a. ETT (12/15) with 12:00 exercise, no ST changes, occasional PACs.   . Hyperlipidemia   . Insomnia   . Migraine headache   . Movement disorder    resltess in left legs  . Postmenopausal   . 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 Premature atrial contractions    a. Holter (12/15) with 8% PACs, no atrial runs or atrial fibrillation.     Past Surgical History:  Procedure Laterality Date  . BREAST BIOPSY  01/01/2011   Procedure: BREAST BIOPSY WITH NEEDLE LOCALIZATION;  Surgeon: Rolm Bookbinder, MD;  Location: [facility];  Service: General;  Laterality: Left;  left breast wire localization biopsy  . BREAST BIOPSY Right 07/27/2012  . BREAST BIOPSY Right 07/07/2016  . BREAST BIOPSY Left 06/30/2016  . cataracts right eye    . CESAREAN SECTION  1986  . HERNIA REPAIR  2000   RIH  . opirectinal membrane peel    . PEG PLACEMENT    . RHINOPLASTY  1983  . TRACHEOSTOMY      There were no vitals filed for this visit.      Subjective Assessment - 08/05/16 1451    Subjective  No when asked is she had pain from lumpectomy  ( surgery date 08/03/2016)   Patient is accompained by: Family member  caregiver [name]   Pertinent History see epic snapshot - ABI/encephalitis/hypoxia from virus; recent hospitalization due to ARF;    Patient Stated Goals Pt unable to state.    Currently in Pain? No/denies                      OT Treatments/Exercises (OP) - 08/05/16 0001      Neurological Re-education Exercises   Other Exercises 1 Neuro re ed to address sit to stand, static and dynamic standing balance with and without UE support, focus on finding midline by attending to propriceptive information from feet, and functional use of RUE in standing and using vision to guide reach and functional use. Pt had lumpectomy on 08/03/2016 - surgeon states no pushing or pulling with LUE for one week therefore avoided addressing functional ambulation as pt uses RW.  Also addressed stant to sit as well as pt manipulating R brake with RUE. Pt can do with break extender - caregiver knows how obtain one.                    OT Short Term Goals - 08/05/16 1606  OT SHORT TERM GOAL  #9   TITLE Pt and family will be mod I with upgraded home activities program - 08/09/2016   Status On-going     OT SHORT TERM GOAL  #10   TITLE Pt will be min a stand step toilet transfers at least 50% of the time using either grab bar or walker for community transfers   Status Achieved     OT SHORT TERM GOAL  #11   TITLE Pt will consistently move from sit to stand with no more than min a for functional mobility   Status On-going     OT SHORT TERM GOAL  #12   TITLE Pt will be no more than mod a consistently for bathing at shower level   Status On-going           OT Long Term Goals - 08/05/16 1606      OT LONG TERM GOAL #1   Title Pt, husband and caregiver will be mod I with upgraded HEP prn - 04/26/2016   Status Achieved     OT LONG TERM GOAL #2   Title Pt, husband and caregiver will be mod I with splint wear and care     Status Achieved     OT LONG TERM GOAL #3   Title Pt will be able to wash face with min a only   Status Achieved     OT LONG TERM GOAL #4   Title Pt will be able to sit without UE support with supevision 100% of the time with no more than 2 vc's while engaged in functional activity.   Status Achieved     OT LONG TERM GOAL #5   Title Pt, husband and caregiver will verbalize understanding of potential after care therapeutic opportunities. - 07/08/2016 date adjusted as pt missed 2 weeks due to hospitalization   Status Achieved     OT LONG TERM GOAL #6   Title Pt will be supervision to wash face consistently   Status Achieved     OT LONG TERM GOAL #7   Title Pt will don shirt with min a and max vc's 50% of the time   Status Achieved     OT LONG TERM GOAL #8   Title Pt will require min a for stand step transfers to toilet using grab bar or walker for community toileting at least 75% of the time. - 09/06/2016   Status On-going     OT LONG TERM GOAL  #9   Baseline Pt will stand with contact gurard with only 1 UE support for static standing balance in prep for abiilty to engage in functional task while standing.    Status Achieved     OT LONG TERM GOAL  #10   TITLE Pt will consistenly move from sit to stand with no more than CGA while caregiver assists with pulling up pants.    Status On-going     OT LONG TERM GOAL  #11   TITLE Pt will require no more than close supervision for static standing with bilateral UE while caregiver assists with hiking pants.    Status On-going     OT LONG TERM GOAL  #12   TITLE Pt will go from stand to sit during ADL activities with no more than min a.    Status On-going               Plan - 08/05/16 1607    Clinical Impression Statement Pt progressing slowly  and demonstrates slow but steady improvemnt. Pt with improved ability to attend to proprioceptive input from feet with max cueing however postural reactions very delayed.    Rehab Potential  Fair   Current Impairments/barriers affecting progress: severity of deficits, very slow rate of recovery; pt recently diagnosed with breast cancer - will monitor ability to tolerate therapy   OT Frequency 2x / week   OT Duration 8 weeks   OT Treatment/Interventions Self-care/ADL training;Ultrasound;Moist Heat;Electrical Stimulation;DME and/or AE instruction;Neuromuscular education;Therapeutic exercise;Functional Mobility Training;Manual Therapy;Passive range of motion;Splinting;Therapeutic activities;Balance training;Patient/family education;Visual/perceptual remediation/compensation;Cognitive remediation/compensation   Plan NMR for trunk/UE's, functional mobility, sit to stand, postural orientation/alignment/control in kneeling and standing, use of proprioceptive input at feet to find midline   Consulted and Agree with Plan of Care Patient;Family member/caregiver   Family Member Consulted caregiver Leesa      Patient will benefit from skilled therapeutic intervention in order to improve the following deficits and impairments:  Decreased endurance, Decreased skin integrity, Impaired vision/preception, Improper body mechanics, Impaired perceived functional ability, Decreased knowledge of precautions, Cardiopulmonary status limiting activity, Decreased activity tolerance, Decreased knowledge of use of DME, Decreased strength, Impaired flexibility, Improper spinal/pelvic alignment, Impaired sensation, Difficulty walking, Decreased mobility, Decreased balance, Decreased cognition, Decreased range of motion, Impaired tone, Impaired UE functional use, Decreased safety awareness, Decreased coordination, Pain  Visit Diagnosis: Unsteadiness on feet  Muscle weakness (generalized)  Abnormal posture  Other symptoms and signs involving the nervous system  Stiffness of left shoulder, not elsewhere classified  Stiffness of right shoulder, not elsewhere classified  Stiffness of left elbow, not elsewhere  classified  Frontal lobe and executive function deficit    Problem List Patient Active Problem List   Diagnosis Date Noted  . Malignant neoplasm of upper-inner quadrant of left breast in female, estrogen receptor positive ([facility]) 07/14/2016  . Heterotopic ossification of bone 03/10/2016  . Acute on chronic respiratory failure with hypoxia ([facility])   . Acute hypoxemic respiratory failure ([place])   . Acute blood loss anemia   . Atelectasis 02/04/2016  . Debility 02/03/2016  . Pneumonia of both lower lobes due to Pseudomonas species ([place])   . Pneumonia of both lower lobes due to methicillin susceptible Staphylococcus aureus (MSSA) ([place])   . Acute respiratory failure with hypoxia ([place])   . Acute encephalopathy   . Seizures ([place])   . Sepsis ([place])   . Hypoxia   . Pain   . HCAP (healthcare-associated pneumonia) 12/28/2015  . Chronic respiratory failure ([facility]) 12/28/2015  . Acute tracheobronchitis 12/24/2015  . Tracheostomy tube present ([place])   . Tracheal stenosis   . Chronic respiratory failure with hypoxia ([place])   . 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 Tracheostomy status ([place]) 09/26/2015  . Allergic rhinitis 09/26/2015  . 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 Movement disorder 09/22/2015  . Encephalitis 09/22/2015  . Chest pain 02/07/2014  . Premature atrial contractions 01/13/2014  . Abnormality of gait 12/04/2013  . Hyperlipidemia 12/21/2011  . Cardiovascular risk factor 12/21/2011  . Bunion 01/31/2008  . Metatarsalgia of both feet 09/20/2007  . FLAT FOOT 09/20/2007  . HALLUX RIGIDUS, ACQUIRED 09/20/2007    Forde Radon [facility] 08/05/2016, 4:11 PM  [facility] [address] [place], [place], [address] Phone: [phone]   Fax:  [phone]  Name: Judy Spears MRN: [id number] Date of Birth: [birth year]

## 2016-08-05 NOTE — Therapy (Signed)
[facility] [address] [place], [place], [address] Phone: [phone]   Fax:  [phone]  Speech Language Pathology Treatment  Patient Details  Name: Judy Spears MRN: [id number] Date of Birth: 06-May-1950 Referring Provider: Charlett Blake, MD  Encounter Date: 08/05/2016      End of Session - 08/05/16 1903    Visit Number 40   Number of Visits 46   Date for SLP Re-Evaluation 09/10/16   SLP Start Time 3846   SLP Stop Time  6599   SLP Time Calculation (min) 39 min   Activity Tolerance Patient tolerated treatment well      Past Medical History:  Diagnosis Date  . Arthritis    lt great toe  . Complication of anesthesia    pt has had headaches post op-did advise to hydra well preop  . Hair loss    right-sided  . History of exercise stress test    a. ETT (12/15) with 12:00 exercise, no ST changes, occasional PACs.   . Hyperlipidemia   . Insomnia   . Migraine headache   . Movement disorder    resltess in left legs  . Postmenopausal   . 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 Premature atrial contractions    a. Holter (12/15) with 8% PACs, no atrial runs or atrial fibrillation.     Past Surgical History:  Procedure Laterality Date  . BREAST BIOPSY  01/01/2011   Procedure: BREAST BIOPSY WITH NEEDLE LOCALIZATION;  Surgeon: Rolm Bookbinder, MD;  Location: [facility];  Service: General;  Laterality: Left;  left breast wire localization biopsy  . BREAST BIOPSY Right 07/27/2012  . BREAST BIOPSY Right 07/07/2016  . BREAST BIOPSY Left 06/30/2016  . cataracts right eye    . CESAREAN SECTION  1986  . HERNIA REPAIR  2000   RIH  . opirectinal membrane peel    . PEG PLACEMENT    . RHINOPLASTY  1983  . TRACHEOSTOMY      There were no vitals filed for this visit.      Subjective Assessment - 08/05/16 1543    Subjective (P)  "Okay" re: how she is feeling after surgery 2 days ago.                ADULT SLP TREATMENT - 08/05/16 1535      General Information   Behavior/Cognition Alert;Cooperative;Pleasant mood   Patient Positioning Upright in chair   Oral care provided Yes     Treatment Provided   Treatment provided Dysphagia     Dysphagia Treatment   Temperature Spikes Noted No   Respiratory Status Room air   Oral Cavity - Dentition Adequate natural dentition   Treatment Methods Skilled observation;Patient/caregiver education;Compensation strategy training   Patient observed directly with PO's Yes   Type of PO's observed Thin liquids   Feeding Able to feed self;Needs assist   Liquids provided via Cup   Oral Phase Signs & Symptoms Anterior loss/spillage   Pharyngeal Phase Signs & Symptoms Multiple swallows;Delayed cough   Type of cueing Verbal;Visual;Tactile   Amount of cueing Minimal   Other treatment/comments Patient observed with thin liquids self-fed via nosey cup. Pt required consistent verbal, tactile cues to limit bolus size and for single sips; observed with delayed cough x1 following multiple consecutive boluses. Anterior oral spillage x2, pt requires consistent min cues for pinching lips, secondary swallow to reduce spillage. Provided training in IMST routine; pt with improved timing/sequencing of steps and completes  5x5 repetitions beginning at 11cm H20 and increasing to 15 cm H20 pressure with consistent min verbal cues. Again attempted EMST which is not effective at this time given air leakage from tracheal stoma. Instructed caregiver to continue with IMST only at this time.     Pain Assessment   Pain Assessment No/denies pain     Tracheostomy Shiley 4 mm Uncuffed   Tracheostomy Properties Placement Date: 05/08/16 Placement Time: 1030 Placed By: ICU physician Brand: Shiley Size (mm): 4 mm Style: Uncuffed     Assessment / Recommendations / Plan   Plan Continue with current plan of care     Dysphagia Recommendations   Diet recommendations Dysphagia 1 (puree);Thin  liquid   Liquids provided via Teaspoon;Cup   Medication Administration Via alternative means   Supervision Full supervision/cueing for compensatory strategies   Compensations Effortful swallow;Multiple dry swallows after each bite/sip;Check for anterior loss;Small sips/bites;Slow rate;Check for pocketing   Postural Changes and/or Swallow Maneuvers Seated upright 90 degrees     Progression Toward Goals   Progression toward goals --  patient appears motivated for progress          SLP Education - 08/05/16 1902    Education provided Yes   Education Details discontinue use of EMST trainer at home, continue with IMST   Person(s) Educated Patient;Caregiver(s)   Methods Explanation   Comprehension Verbalized understanding          SLP Short Term Goals - 08/05/16 1905      SLP SHORT TERM GOAL #1   Title Patient will utilize intelligibility strategies with moderate visual cues from communication partner/visual aid to communicate 5-7 word wants, needs, thoughts and ideas.   Status Achieved     SLP SHORT TERM GOAL #2   Title Patient will utilize intelligibility strategies, breath support with mod A for phrase repetition in 75% of trials.   Status Achieved     SLP SHORT TERM GOAL #3   Title Pt will swallow thin liquids, purees and soft solids in 9/10 trials with no overt signs of aspiration.   Status Achieved     SLP SHORT TERM GOAL #4   Title Patient will tolerate 6oz water, >95% with no overt signs of aspiration.   Status Achieved     SLP SHORT TERM GOAL #5   Title Patient will utilize verbal cues for lip closure, dry swallow for secretion management in 75% of opportunities.   Status Achieved          SLP Long Term Goals - 08/05/16 1905      SLP LONG TERM GOAL #1   Title Patient will utilize multimodal communication/AAC to clarify wants, needs, thoughts or ideas during communication breakdowns with max A   Status Deferred     SLP LONG TERM GOAL #2   Title Patient will  utilize intelligibility strategies, breath support with min A visual cues for 5-7 word responses with 80% accuracy.   Baseline renewed 07-30-14 for week of 08-02-16   Time 4   Period Weeks   Status On-going     SLP LONG TERM GOAL #3   Title Patient will demonstrate readiness for MBS to determine safety for pleasure feeds by timely initiation of swallow in 3/5 trials with ice chips/H20.   Status Achieved     SLP LONG TERM GOAL #4   Title Pt will consume pleasure feeds of desired consistency (soft solids and thin liquids) with signs of aspiration in fewer than 10% of trials with caregiver providing  moderate cues and monitoring for overt signs of aspiration.   Baseline renewed 07-29-16 for week of 08-02-16   Time 4   Period Weeks   Status On-going     SLP LONG TERM GOAL #5   Title Patient will utilize visual cue for lip closure, dry swallow for secretion management in 75% of opportunities.   Status Achieved     SLP LONG TERM GOAL #6   Title Pt will demo HEP for RMT, dysphagia with mod caregiver assistance.   Baseline renewed 07-29-16 for week of 08-02-16   Time 4   Period Weeks   Status On-going          Plan - 08/05/16 1904    Clinical Impression Statement Pt alert and attentive with no need for cuing this session. Pt performing inspiratory training exercises today with appropriate sequencing/timing/intensity with consistent min A. Dysphagia was targeted in today's session - see "skilled treatment" for details. Pt will benefit from continued skilled ST to address underlying physical impairments impacting swallowing and speech, as well as education and training in monitoring for and reducing risks of aspiration.   Speech Therapy Frequency 2x / week   Duration 4 weeks   Treatment/Interventions Aspiration precaution training;Trials of upgraded texture/liquids;Compensatory strategies;Functional tasks;Patient/family education;Cueing hierarchy;Diet toleration management by SLP;Cognitive  reorganization;Compensatory techniques;SLP instruction and feedback;Internal/external aids;Multimodal communcation approach;Pharyngeal strengthening exercises   Potential to Achieve Goals Good   Potential Considerations Ability to learn/carryover information;Co-morbidities;Severity of impairments   SLP Home Exercise Plan Pleasure feeds purees, thin liquids, strict oral care before and after PO, monitor for signs of fatigue, aspiration. RMT   Consulted and Agree with Plan of Care Patient;Family member/caregiver   Family Member Consulted caregiver lisa      Patient will benefit from skilled therapeutic intervention in order to improve the following deficits and impairments:   Dysphagia, oropharyngeal phase  Dysarthria and anarthria    Problem List Patient Active Problem List   Diagnosis Date Noted  . Malignant neoplasm of upper-inner quadrant of left breast in female, estrogen receptor positive ([place]) 07/14/2016  . Heterotopic ossification of bone 03/10/2016  . Acute on chronic respiratory failure with hypoxia ([place])   . Acute hypoxemic respiratory failure ([place])   . Acute blood loss anemia   . Atelectasis 02/04/2016  . Debility 02/03/2016  . Pneumonia of both lower lobes due to Pseudomonas species ([place])   . Pneumonia of both lower lobes due to methicillin susceptible Staphylococcus aureus (MSSA) ([place])   . Acute respiratory failure with hypoxia ([place])   . Acute encephalopathy   . Seizures ([place])   . Sepsis ([place])   . Hypoxia   . Pain   . HCAP (healthcare-associated pneumonia) 12/28/2015  . Chronic respiratory failure ([place]) 12/28/2015  . Acute tracheobronchitis 12/24/2015  . Tracheostomy tube present ([place])   . Tracheal stenosis   . Chronic respiratory failure with hypoxia ([place])   . 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) 10/20/2015  . Tracheostomy status ([name]) 09/26/2015  . Allergic rhinitis 09/26/2015  . 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 Movement disorder 09/22/2015  .  Encephalitis 09/22/2015  . Chest pain 02/07/2014  . Premature atrial contractions 01/13/2014  . Abnormality of gait 12/04/2013  . Hyperlipidemia 12/21/2011  . Cardiovascular risk factor 12/21/2011  . Bunion 01/31/2008  . Metatarsalgia of both feet 09/20/2007  . FLAT FOOT 09/20/2007  . HALLUX RIGIDUS, ACQUIRED 09/20/2007   Deneise Lever, [place], [place] Speech-Language Pathologist  Aliene Altes 08/05/2016, 7:07 PM  [facility]  [address] [place], [place], [address] Phone: [phone]   Fax:  [phone]   Name: Judy Spears MRN: [id number] Date of Birth: 1950-08-17

## 2016-08-07 NOTE — Therapy (Signed)
[facility] [address] [place], [place], [address] Phone: [phone]   Fax:  [phone]  Physical Therapy Treatment  Patient Details  Name: Judy Spears MRN: [id number] Date of Birth: 1950-11-06 Referring Provider: Faith Rogue, MD/Andrew Wynn Banker, MD  Encounter Date: 08/05/2016      PT End of Session - 08/07/16 0801    Visit Number 41   Number of Visits 47   Date for PT Re-Evaluation 08/23/16   Authorization Type BCBS   PT Start Time 1417   PT Stop Time 1500   PT Time Calculation (min) 43 min   Activity Tolerance Patient tolerated treatment well   Behavior During Therapy [facility] for tasks assessed/performed      Past Medical History:  Diagnosis Date  . Arthritis    lt great toe  . Complication of anesthesia    pt has had headaches post op-did advise to hydra well preop  . Hair loss    right-sided  . History of exercise stress test    a. ETT (12/15) with 12:00 exercise, no ST changes, occasional PACs.   . Hyperlipidemia   . Insomnia   . Migraine headache   . Movement disorder    resltess in left legs  . Postmenopausal   . 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 Premature atrial contractions    a. Holter (12/15) with 8% PACs, no atrial runs or atrial fibrillation.     Past Surgical History:  Procedure Laterality Date  . BREAST BIOPSY  01/01/2011   Procedure: BREAST BIOPSY WITH NEEDLE LOCALIZATION;  Surgeon: Emelia Loron, MD;  Location: [facility];  Service: General;  Laterality: Left;  left breast wire localization biopsy  . BREAST BIOPSY Right 07/27/2012  . BREAST BIOPSY Right 07/07/2016  . BREAST BIOPSY Left 06/30/2016  . cataracts right eye    . CESAREAN SECTION  1986  . HERNIA REPAIR  2000   RIH  . opirectinal membrane peel    . PEG PLACEMENT    . RHINOPLASTY  1983  . TRACHEOSTOMY      There were no vitals filed for this visit.      Subjective Assessment - 08/07/16 0801    Subjective Caregiver Cipriano Mile reports surgery yesterday went well, is to limit pectoral activation until next week's appt.  No pushing/pulling or LUE WB.     Pertinent History Precautions:  PEG.  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  Another round of botox in LLE on 02/06/16   Patient Stated Goals Per husband, "For [place] to be as independent as possible."               NMR:  Worked on static and dynamic standing during session with support on rolling table (intermittent UE support during challenges) due to new WB precautions in LUE.  Had pt work on improving posterior weight shift onto heels, upright trunk, righting head to midline, lateral weight shifting and side stepping and returning to midline (to music) during session.  Had pt remove LUE from table at times and even remove RUE during some music.  Min A to close S for standing tasks during session.  Pt tolerated very well.  Note that she requires increased time to process cues during session with more episodes of decreased attention "blank stare" during session.  Caregiver Cipriano Mile reports  that she has been this way since yesterday and feels that anesthesia is still working out of her system.                      PT Short Term Goals - 07/22/16 1322      PT SHORT TERM GOAL #1   Title Jay Back on pt's current manual wheelchair to be switched out to regular back; pt will demonstrate abilty to sit in wheelchair with upright with less supportive wheelchair back.  07-24-16   Baseline Jay Back on current wheelchair   Time 4   Period Weeks   Status Achieved     PT SHORT TERM GOAL #2   Title Pt will perform sit to stand from wheelchair to RW with CGA.  07-24-16   Baseline min/guard to min A 07/22/16   Time 4   Period Weeks   Status Partially  Met     PT SHORT TERM GOAL #3   Title Family to obtain bil. platform RW to begin assisting pt with ambulation in her home environment.  07-24-16   Baseline met with B PFRW 07/22/16   Time 4   Period Weeks   Status Achieved     PT SHORT TERM GOAL #4   Title Family will report pt rolling from supine to Rt and Lt sides in hospital bed at home with supervision without use of bed rail with cues for positioning of LE's.  07-24-16   Baseline pt needs min assist at times - activity just demonstrated by pt on 06-24-16   Time 4   Period Weeks   Status New     PT SHORT TERM GOAL #5   Title Basic transfers from wheelchair to bed or commode with CGA.  07-24-16   Baseline min A 07/22/16 with RW   Time 4   Period Weeks   Status Partially Met     PT SHORT TERM GOAL #6   Title Amb. 85' with bil. platform RW with CGA to begin amb. at home with assistance safely.  07-24-16   Baseline 115' with BPFRW and now RW with min A 07/22/16   Time 4   Period Weeks   Status Partially Met           PT Long Term Goals - 06/25/16 1142      PT LONG TERM GOAL #1   Title Pt will perform supine rolling R and L at mod I level in order to indicate improved independence with bed mobility.  (Target Date: 06/25/16)/ 08-23-16  CONT. LTG til 08-23-16   Baseline inconsistent in performance - pt needs cues for LE flexion to assist with pushing for rolling and cues for UE positioning   Time 8   Period Weeks   Status On-going     PT LONG TERM GOAL #2   Title Pt will perform sit>SL R or L modified independently in order to indicate improved independence with getting into bed.   08-23-16   Baseline min to mod assist needed - 06-24-16   Time 8   Status New     PT LONG TERM GOAL #3   Title Pt will transfer Lt/Rt sidelying to sitting with CGA for incr. independence with getting out of bed.  08-23-16   Baseline min to mod assist needed -06-24-16   Time 8   Period Weeks   Status New     PT LONG TERM GOAL #4   Title Pt will  perform  basic transfers with CGA to indicate improved functional independence.  08-23-16   Baseline min assist needed - 06-24-16   Time 8   Period Weeks   Status New     PT LONG TERM GOAL #5   Title Pt will perform static standing with bil. UE support with distant supervision for >/= 10" for improved standing balance.  08-23-16   Baseline close SBA to CGA needed - pt standing with bil UE support for 8-10" per caregiver's report -- 06-24-16   Time 8   Period Weeks   Status New     Additional Long Term Goals   Additional Long Term Goals Yes     PT LONG TERM GOAL #6   Title Pt will demonstrate ability to ambulate up to 25' w/ LRAD at min A level in order to assist with getting around home.  CONT LTG til 08-23-16; goal upgraded to 50'   Baseline bil. platform RW to be used in home   Time 8   Period Weeks   Status On-going     PT LONG TERM GOAL #7   Title Perform sit to stand and stand to sit to RW or to another object with CGA.  08-23-16   Baseline min assist needed - 06-24-16   Time 8   Period Weeks   Status New             Patient will benefit from skilled therapeutic intervention in order to improve the following deficits and impairments:  Abnormal gait, Decreased balance, Decreased endurance, Decreased cognition, Cardiopulmonary status limiting activity, Decreased activity tolerance, Decreased coordination, Decreased strength, Impaired flexibility, Impaired tone, Decreased mobility, Increased muscle spasms, Impaired vision/preception, Decreased range of motion, Impaired UE functional use, Postural dysfunction, Improper body mechanics, Impaired perceived functional ability, Decreased knowledge of precautions, Decreased knowledge of use of DME  Visit Diagnosis: Unsteadiness on feet  Muscle weakness (generalized)  Abnormal posture  Other symptoms and signs involving the nervous system     Problem List Patient Active Problem List   Diagnosis Date Noted  . Malignant  neoplasm of upper-inner quadrant of left breast in female, estrogen receptor positive ([place]) 07/14/2016  . Heterotopic ossification of bone 03/10/2016  . Acute on chronic respiratory failure with hypoxia ([place])   . Acute hypoxemic respiratory failure ([place])   . Acute blood loss anemia   . Atelectasis 02/04/2016  . Debility 02/03/2016  . Pneumonia of both lower lobes due to Pseudomonas species ([place])   . Pneumonia of both lower lobes due to methicillin susceptible Staphylococcus aureus (MSSA) ([place])   . Acute respiratory failure with hypoxia ([place])   . Acute encephalopathy   . Seizures ([place])   . Sepsis ([place])   . Hypoxia   . Pain   . HCAP (healthcare-associated pneumonia) 12/28/2015  . Chronic respiratory failure ([place]) 12/28/2015  . Acute tracheobronchitis 12/24/2015  . Tracheostomy tube present ([place])   . Tracheal stenosis   . Chronic respiratory failure with hypoxia ([place])   . Spastic tetraplegia ([place]) 10/20/2015  . Tracheostomy status ([place]) 09/26/2015  . Allergic rhinitis 09/26/2015  . 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 Movement disorder 09/22/2015  . Encephalitis 09/22/2015  . Chest pain 02/07/2014  . Premature atrial contractions 01/13/2014  . Abnormality of gait 12/04/2013  . Hyperlipidemia 12/21/2011  . Cardiovascular risk factor 12/21/2011  . Bunion 01/31/2008  . Metatarsalgia of both feet 09/20/2007  . FLAT FOOT 09/20/2007  . HALLUX RIGIDUS, ACQUIRED 09/20/2007    Cameron Sprang, PT, MPT  [facility] [address] [place], [place], [address] Phone: [phone]   Fax:  [phone] 08/07/16, 8:08 AM  (Encounter on 08/05/16) Name: Judy Spears MRN: [id number] Date of Birth: 1950/04/30

## 2016-08-09 ENCOUNTER — Encounter: Payer: Self-pay | Admitting: Rehabilitation

## 2016-08-09 ENCOUNTER — Ambulatory Visit: Payer: Medicare Other | Admitting: Occupational Therapy

## 2016-08-09 ENCOUNTER — Other Ambulatory Visit: Payer: Self-pay | Admitting: Oncology

## 2016-08-09 ENCOUNTER — Ambulatory Visit: Payer: Medicare Other | Admitting: Rehabilitation

## 2016-08-09 ENCOUNTER — Encounter: Payer: Self-pay | Admitting: Occupational Therapy

## 2016-08-09 ENCOUNTER — Ambulatory Visit: Payer: Medicare Other | Admitting: Speech Pathology

## 2016-08-09 DIAGNOSIS — R29818 Other symptoms and signs involving the nervous system: Secondary | ICD-10-CM

## 2016-08-09 DIAGNOSIS — R41844 Frontal lobe and executive function deficit: Secondary | ICD-10-CM

## 2016-08-09 DIAGNOSIS — R1312 Dysphagia, oropharyngeal phase: Secondary | ICD-10-CM

## 2016-08-09 DIAGNOSIS — R471 Dysarthria and anarthria: Secondary | ICD-10-CM

## 2016-08-09 DIAGNOSIS — R2681 Unsteadiness on feet: Secondary | ICD-10-CM | POA: Diagnosis not present

## 2016-08-09 DIAGNOSIS — M25612 Stiffness of left shoulder, not elsewhere classified: Secondary | ICD-10-CM

## 2016-08-09 DIAGNOSIS — R293 Abnormal posture: Secondary | ICD-10-CM

## 2016-08-09 DIAGNOSIS — R41841 Cognitive communication deficit: Secondary | ICD-10-CM

## 2016-08-09 DIAGNOSIS — R2689 Other abnormalities of gait and mobility: Secondary | ICD-10-CM

## 2016-08-09 DIAGNOSIS — M6281 Muscle weakness (generalized): Secondary | ICD-10-CM

## 2016-08-09 DIAGNOSIS — M25622 Stiffness of left elbow, not elsewhere classified: Secondary | ICD-10-CM

## 2016-08-09 DIAGNOSIS — M25611 Stiffness of right shoulder, not elsewhere classified: Secondary | ICD-10-CM

## 2016-08-09 NOTE — Therapy (Signed)
[facility] [address] [place], [place], [address] Phone: [phone]   Fax:  [phone]  Occupational Therapy Treatment  Patient Details  Name: Judy Spears MRN: [id number] Date of Birth: November 26, 1950 Referring Provider: Dr. Quita Skye. [name]  Encounter Date: 08/09/2016      OT End of Session - 08/09/16 1641    Visit Number 43   Number of Visits 56   Date for OT Re-Evaluation 09/07/16   Authorization Type BCBS unlimited visits   OT Start Time 3154   OT Stop Time 1615   OT Time Calculation (min) 44 min   Activity Tolerance Patient tolerated treatment well      Past Medical History:  Diagnosis Date  . Arthritis    lt great toe  . Complication of anesthesia    pt has had headaches post op-did advise to hydra well preop  . Hair loss    right-sided  . History of exercise stress test    a. ETT (12/15) with 12:00 exercise, no ST changes, occasional PACs.   . Hyperlipidemia   . Insomnia   . Migraine headache   . Movement disorder    resltess in left legs  . Postmenopausal   . 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 Premature atrial contractions    a. Holter (12/15) with 8% PACs, no atrial runs or atrial fibrillation.     Past Surgical History:  Procedure Laterality Date  . BREAST BIOPSY  01/01/2011   Procedure: BREAST BIOPSY WITH NEEDLE LOCALIZATION;  Surgeon: Rolm Bookbinder, MD;  Location: [facility];  Service: General;  Laterality: Left;  left breast wire localization biopsy  . BREAST BIOPSY Right 07/27/2012  . BREAST BIOPSY Right 07/07/2016  . BREAST BIOPSY Left 06/30/2016  . cataracts right eye    . CESAREAN SECTION  1986  . HERNIA REPAIR  2000   RIH  . opirectinal membrane peel    . PEG PLACEMENT    . RHINOPLASTY  1983  . TRACHEOSTOMY      There were no vitals filed for this visit.      Subjective Assessment - 08/09/16 1537    Subjective  Pt not as alert today - not verbalizing    Patient is accompained by: Family member  husband [name], caregiver Leesa   Pertinent History see epic snapshot - ABI/encephalitis/hypoxia from virus; recent hospitalization due to ARF;    Patient Stated Goals Pt unable to state.    Currently in Pain? No/denies                      OT Treatments/Exercises (OP) - 08/09/16 0001      ADLs   Bathing Practiced simulated bathing in clinic with washcloth - pt is able to wash chest, BUE's, stomach, and thighs with cueing and encouragement.  Dicussed with pt, husband and caregiver that this is important not only for greater independence but also to find pathways for familiar activities for improved motor planning for use of UE's as well as activity tolerance, balance and core work.  Husband and caregiver verbalized understanding.       Neurological Re-education Exercises   Other Exercises 1 Neuro re ed to address sit to stand initially from elevated surface with emphasis on forward lean and pushing down with LLE's instead of pulling with UE's.  Pt needed min a for forwrard weight shift but then was able to power up into standing.  Also addressed stand to sit with emphasis  on forward leaning, increasing speed of stand to sit in order to tap into automaticity of motor movements. Pt needed min a forward weight shift however as speed improved so did more normal automatic sequencing of motor for LE"s for stand to sit.  Pt benefits from signiificant repetition. Progressed to lower surfaces.  Then transtioned movement into squat pivot transfer. Pt needs step  by step cuing and min a for forward lean and then min a for pivot.                    OT Short Term Goals - 08/09/16 1638      OT SHORT TERM GOAL  #9   TITLE Pt and family will be mod I with upgraded home activities program - 08/09/2016   Status Achieved     OT SHORT TERM GOAL  #10   TITLE Pt will be min a stand step toilet transfers at least 50% of the time using either grab bar  or walker for community transfers   Status Achieved     OT SHORT TERM GOAL  #11   TITLE Pt will consistently move from sit to stand with no more than min a for functional mobility   Status Achieved     OT SHORT TERM GOAL  #12   TITLE Pt will be no more than mod a consistently for bathing at shower level   Status On-going           OT Long Term Goals - 08/09/16 1639      OT LONG TERM GOAL #1   Title Pt, husband and caregiver will be mod I with upgraded HEP prn - 04/26/2016   Status Achieved     OT LONG TERM GOAL #2   Title Pt, husband and caregiver will be mod I with splint wear and care    Status Achieved     OT LONG TERM GOAL #3   Title Pt will be able to wash face with min a only   Status Achieved     OT LONG TERM GOAL #4   Title Pt will be able to sit without UE support with supevision 100% of the time with no more than 2 vc's while engaged in functional activity.   Status Achieved     OT LONG TERM GOAL #5   Title Pt, husband and caregiver will verbalize understanding of potential after care therapeutic opportunities. - 07/08/2016 date adjusted as pt missed 2 weeks due to hospitalization   Status Achieved     OT LONG TERM GOAL #6   Title Pt will be supervision to wash face consistently   Status Achieved     OT LONG TERM GOAL #7   Title Pt will don shirt with min a and max vc's 50% of the time   Status Achieved     OT LONG TERM GOAL #8   Title Pt will require min a for stand step transfers to toilet using grab bar or walker for community toileting at least 75% of the time. - 09/06/2016   Status On-going     OT LONG TERM GOAL  #9   Baseline Pt will stand with contact gurard with only 1 UE support for static standing balance in prep for abiilty to engage in functional task while standing.    Status Achieved     OT LONG TERM GOAL  #10   TITLE Pt will consistenly move from sit to stand with no more than CGA  while caregiver assists with pulling up pants.    Status  On-going     OT LONG TERM GOAL  #11   TITLE Pt will require no more than close supervision for static standing with bilateral UE while caregiver assists with hiking pants.    Status On-going     OT LONG TERM GOAL  #12   TITLE Pt will go from stand to sit during ADL activities with no more than min a.    Status On-going               Plan - 08/09/16 1639    Clinical Impression Statement Pt less alert today - caregiver reports that she feels this may be due to meds used in recent surgery.     Rehab Potential Fair   Current Impairments/barriers affecting progress: severity of deficits, very slow rate of recovery; pt recently diagnosed with breast cancer - will monitor ability to tolerate therapy   OT Frequency 2x / week   OT Duration 8 weeks   OT Treatment/Interventions Self-care/ADL training;Ultrasound;Moist Heat;Electrical Stimulation;DME and/or AE instruction;Neuromuscular education;Therapeutic exercise;Functional Mobility Training;Manual Therapy;Passive range of motion;Splinting;Therapeutic activities;Balance training;Patient/family education;Visual/perceptual remediation/compensation;Cognitive remediation/compensation   Plan NMR for trunk/UE's, functional mobility, sit to stand, stand to sit, postural orientation/alignment/control in kneeling and standing, use of propriocepive input at feet to find midline.    Consulted and Agree with Plan of Care Patient;Family member/caregiver   Family Member Consulted caregiver Leesa, hsuband [name]      Patient will benefit from skilled therapeutic intervention in order to improve the following deficits and impairments:  Decreased endurance, Decreased skin integrity, Impaired vision/preception, Improper body mechanics, Impaired perceived functional ability, Decreased knowledge of precautions, Cardiopulmonary status limiting activity, Decreased activity tolerance, Decreased knowledge of use of DME, Decreased strength, Impaired flexibility, Improper  spinal/pelvic alignment, Impaired sensation, Difficulty walking, Decreased mobility, Decreased balance, Decreased cognition, Decreased range of motion, Impaired tone, Impaired UE functional use, Decreased safety awareness, Decreased coordination, Pain  Visit Diagnosis: Unsteadiness on feet  Muscle weakness (generalized)  Abnormal posture  Other symptoms and signs involving the nervous system  Stiffness of left shoulder, not elsewhere classified  Stiffness of right shoulder, not elsewhere classified  Stiffness of left elbow, not elsewhere classified  Frontal lobe and executive function deficit    Problem List Patient Active Problem List   Diagnosis Date Noted  . Malignant neoplasm of upper-inner quadrant of left breast in female, estrogen receptor positive ([place]) 07/14/2016  . Heterotopic ossification of bone 03/10/2016  . Acute on chronic respiratory failure with hypoxia ([place])   . Acute hypoxemic respiratory failure ([place])   . Acute blood loss anemia   . Atelectasis 02/04/2016  . Debility 02/03/2016  . Pneumonia of both lower lobes due to Pseudomonas species ([place])   . Pneumonia of both lower lobes due to methicillin susceptible Staphylococcus aureus (MSSA) ([place])   . Acute respiratory failure with hypoxia ([place])   . Acute encephalopathy   . Seizures ([place])   . Sepsis ([place])   . Hypoxia   . Pain   . HCAP (healthcare-associated pneumonia) 12/28/2015  . Chronic respiratory failure ([place]) 12/28/2015  . Acute tracheobronchitis 12/24/2015  . Tracheostomy tube present ([place])   . Tracheal stenosis   . Chronic respiratory failure with hypoxia ([place])   . Spastic tetraplegia ([place]) 10/20/2015  . Tracheostomy status ([place]) 09/26/2015  . Allergic rhinitis 09/26/2015  . 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 Movement disorder 09/22/2015  . Encephalitis 09/22/2015  . Chest pain 02/07/2014  .  Premature atrial contractions 01/13/2014  . Abnormality of gait 12/04/2013  .  Hyperlipidemia 12/21/2011  . Cardiovascular risk factor 12/21/2011  . Bunion 01/31/2008  . Metatarsalgia of both feet 09/20/2007  . FLAT FOOT 09/20/2007  . HALLUX RIGIDUS, ACQUIRED 09/20/2007    Quay Burow, OTR/L 08/09/2016, 4:44 PM  [facility] [address] [place], [place], [address] Phone: [phone]   Fax:  [phone]  Name: Judy Spears MRN: [id number] Date of Birth: 02-14-1950

## 2016-08-09 NOTE — Therapy (Signed)
[facility] [address] [place], [place], [address] Phone: [phone]   Fax:  [phone]  Physical Therapy Treatment  Patient Details  Name: Judy Spears MRN: [id number] Date of Birth: 1950-12-02 Referring Provider: Faith Rogue, MD/Andrew Wynn Banker, MD  Encounter Date: 08/09/2016      PT End of Session - 08/09/16 2020    Visit Number 42   Number of Visits 47   Date for PT Re-Evaluation 08/23/16   Authorization Type BC[employer]   PT Start Time 1447   PT Stop Time 1530   PT Time Calculation (min) 43 min   Activity Tolerance Patient tolerated treatment well   Behavior During Therapy [facility] for tasks assessed/performed      Past Medical History:  Diagnosis Date  . Arthritis    lt great toe  . Complication of anesthesia    pt has had headaches post op-did advise to hydra well preop  . Hair loss    right-sided  . History of exercise stress test    a. ETT (12/15) with 12:00 exercise, no ST changes, occasional PACs.   . Hyperlipidemia   . Insomnia   . Migraine headache   . Movement disorder    resltess in left legs  . Postmenopausal   . 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 Premature atrial contractions    a. Holter (12/15) with 8% PACs, no atrial runs or atrial fibrillation.     Past Surgical History:  Procedure Laterality Date  . BREAST BIOPSY  01/01/2011   Procedure: BREAST BIOPSY WITH NEEDLE LOCALIZATION;  Surgeon: Emelia Loron, MD;  Location: [facility];  Service: General;  Laterality: Left;  left breast wire localization biopsy  . BREAST BIOPSY Right 07/27/2012  . BREAST BIOPSY Right 07/07/2016  . BREAST BIOPSY Left 06/30/2016  . cataracts right eye    . CESAREAN SECTION  1986  . HERNIA REPAIR  2000   RIH  . opirectinal membrane peel    . PEG PLACEMENT    . RHINOPLASTY  1983  . TRACHEOSTOMY      There were no vitals filed for this visit.      Subjective Assessment - 08/09/16 2019     Subjective Caregiver [name] continues to report pt more sleepy since having procedure last week.     Patient is accompained by: Family member   Pertinent History Precautions:  PEG.  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  Another round of botox in LLE on 02/06/16   Patient Stated Goals Per husband, "For Judy Spears to be as independent as possible."   Currently in Pain? No/denies           Self care:  Had lengthy discussion with pt/spouse/caregiver regarding placing pt on hold following this POC.  Feel that she is at a point in her therapy in which she needs to work at home.  She is now doing more at home with standing and gait.  Feel that spouse and caregiver are doing very well with cuing her and pt needs repetition and practice to improve the skills that we have been working on in therapy.  Spouse/caregiver hesitant regarding this decision and feel that she will have regression in progress and feel that she is not motivated at home as she is in therapy.  PT discussed with them the goals  of therapy and the importance of having her transition to home and having intermittent bouts of therapy every 3-4 months to address specific goals.  Will continue to address this in the coming weeks.    NMR: Attempted to work in tall kneeling today for improved postural control, finding midline, and improved L lateral weight shift.  Pt requires +2A for safety at most points in task due to fatigue and inability to gait and maintain midline.  Pt with heavy forward and R lateral lean during session with decreased ability to achieve despite therapist assist.                        PT Education - 08/09/16 2020    Education provided Yes   Education Details education regarding placing pt on hold following this cert period.     Person(s) Educated Patient;Spouse;Caregiver(s)   Methods Explanation   Comprehension Verbalized understanding;Need further instruction          PT Short Term Goals - 07/22/16 1322      PT SHORT TERM GOAL #1   Title Jay Back on pt's current manual wheelchair to be switched out to regular back; pt will demonstrate abilty to sit in wheelchair with upright with less supportive wheelchair back.  07-24-16   Baseline Jay Back on current wheelchair   Time 4   Period Weeks   Status Achieved     PT SHORT TERM GOAL #2   Title Pt will perform sit to stand from wheelchair to RW with CGA.  07-24-16   Baseline min/guard to min A 07/22/16   Time 4   Period Weeks   Status Partially Met     PT SHORT TERM GOAL #3   Title Family to obtain bil. platform RW to begin assisting pt with ambulation in her home environment.  07-24-16   Baseline met with B PFRW 07/22/16   Time 4   Period Weeks   Status Achieved     PT SHORT TERM GOAL #4   Title Family will report pt rolling from supine to Rt and Lt sides in hospital bed at home with supervision without use of bed rail with cues for positioning of LE's.  07-24-16   Baseline pt needs min assist at times - activity just demonstrated by pt on 06-24-16   Time 4   Period Weeks   Status New     PT SHORT TERM GOAL #5   Title Basic transfers from wheelchair to bed or commode with CGA.  07-24-16   Baseline min A 07/22/16 with RW   Time 4   Period Weeks   Status Partially Met     PT SHORT TERM GOAL #6   Title Amb. 55' with bil. platform RW with CGA to begin amb. at home with assistance safely.  07-24-16   Baseline 115' with BPFRW and now RW with min A 07/22/16   Time 4   Period Weeks   Status Partially Met           PT Long Term Goals - 06/25/16 1142      PT LONG TERM GOAL #1   Title Pt will perform supine rolling R and L at mod I level in order to indicate improved independence with bed mobility.  (Target Date: 06/25/16)/ 08-23-16  CONT. LTG til  08-23-16   Baseline inconsistent in performance - pt needs cues for LE flexion to assist with pushing for rolling and cues for UE positioning  Time 8   Period Weeks   Status On-going     PT LONG TERM GOAL #2   Title Pt will perform sit>SL R or L modified independently in order to indicate improved independence with getting into bed.   08-23-16   Baseline min to mod assist needed - 06-24-16   Time 8   Status New     PT LONG TERM GOAL #3   Title Pt will transfer Lt/Rt sidelying to sitting with CGA for incr. independence with getting out of bed.  08-23-16   Baseline min to mod assist needed -06-24-16   Time 8   Period Weeks   Status New     PT LONG TERM GOAL #4   Title Pt will perform basic transfers with CGA to indicate improved functional independence.  08-23-16   Baseline min assist needed - 06-24-16   Time 8   Period Weeks   Status New     PT LONG TERM GOAL #5   Title Pt will perform static standing with bil. UE support with distant supervision for >/= 10" for improved standing balance.  08-23-16   Baseline close SBA to CGA needed - pt standing with bil UE support for 8-10" per caregiver's report -- 06-24-16   Time 8   Period Weeks   Status New     Additional Long Term Goals   Additional Long Term Goals Yes     PT LONG TERM GOAL #6   Title Pt will demonstrate ability to ambulate up to 25' w/ LRAD at min A level in order to assist with getting around home.  CONT LTG til 08-23-16; goal upgraded to 50'   Baseline bil. platform RW to be used in home   Time 8   Period Weeks   Status On-going     PT LONG TERM GOAL #7   Title Perform sit to stand and stand to sit to RW or to another object with CGA.  08-23-16   Baseline min assist needed - 06-24-16   Time 8   Period Weeks   Status New               Plan - 08/09/16 2021    Clinical Impression Statement Session began with lengthy discussion with pt/spouse and caregiver regarding placing pt on hold for 3-4 months following  this current plan of care.  Spouse and caregiver very hesitant and would like to discuss further, but PT wanting to initiate this conversation.  Also attempted to work in tall kneeling for improved postural control, however pt very fatigued during task and requires +2A with inability to maintain midline posture without support.     Rehab Potential Fair   Clinical Impairments Affecting Rehab Potential Chronicity of condition, continued medical management, but has very strong family and financial support   PT Frequency 2x / week   PT Duration 8 weeks   PT Treatment/Interventions ADLs/Self Care Home Management;DME Instruction;Gait training;Stair training;Functional mobility training;Therapeutic activities;Patient/family education;Cognitive remediation;Neuromuscular re-education;Therapeutic exercise;Balance training;Orthotic Fit/Training;Wheelchair mobility training;Manual techniques;Splinting;Visual/perceptual remediation/compensation;Vestibular;Passive range of motion;Electrical Stimulation   PT Next Visit Plan cont gait, balance, transfer, bed mobility training; try technique for upright posture in standing (if pt able to tolerate), continue to speak with family regarding placing pt on hold as needed.    Consulted and Agree with Plan of Care Patient;Family member/caregiver   Family Member Consulted caregiver, Misty Stanley and husband [name]      Patient will benefit from skilled therapeutic intervention in order to improve the  following deficits and impairments:  Abnormal gait, Decreased balance, Decreased endurance, Decreased cognition, Cardiopulmonary status limiting activity, Decreased activity tolerance, Decreased coordination, Decreased strength, Impaired flexibility, Impaired tone, Decreased mobility, Increased muscle spasms, Impaired vision/preception, Decreased range of motion, Impaired UE functional use, Postural dysfunction, Improper body mechanics, Impaired perceived functional ability, Decreased  knowledge of precautions, Decreased knowledge of use of DME  Visit Diagnosis: Unsteadiness on feet  Muscle weakness (generalized)  Abnormal posture  Other abnormalities of gait and mobility     Problem List Patient Active Problem List   Diagnosis Date Noted  . Malignant neoplasm of upper-inner quadrant of left breast in female, estrogen receptor positive ([place]) 07/14/2016  . Heterotopic ossification of bone 03/10/2016  . Acute on chronic respiratory failure with hypoxia ([place])   . Acute hypoxemic respiratory failure ([place])   . Acute blood loss anemia   . Atelectasis 02/04/2016  . Debility 02/03/2016  . Pneumonia of both lower lobes due to Pseudomonas species ([place])   . Pneumonia of both lower lobes due to methicillin susceptible Staphylococcus aureus (MSSA) ([place])   . Acute respiratory failure with hypoxia ([place])   . Acute encephalopathy   . Seizures ([place])   . Sepsis ([place])   . Hypoxia   . Pain   . HCAP (healthcare-associated pneumonia) 12/28/2015  . Chronic respiratory failure ([place]) 12/28/2015  . Acute tracheobronchitis 12/24/2015  . Tracheostomy tube present ([place])   . Tracheal stenosis   . Chronic respiratory failure with hypoxia ([place])   . Spastic tetraplegia ([place]) 10/20/2015  . Tracheostomy status ([place]) 09/26/2015  . Allergic rhinitis 09/26/2015  . 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 Movement disorder 09/22/2015  . Encephalitis 09/22/2015  . Chest pain 02/07/2014  . Premature atrial contractions 01/13/2014  . Abnormality of gait 12/04/2013  . Hyperlipidemia 12/21/2011  . Cardiovascular risk factor 12/21/2011  . Bunion 01/31/2008  . Metatarsalgia of both feet 09/20/2007  . FLAT FOOT 09/20/2007  . HALLUX RIGIDUS, ACQUIRED 09/20/2007    Cameron Sprang, PT, MPT [facility] [address] [place], [place], [address] Phone: [phone]   Fax:  [phone] 08/09/16, 8:29 PM  Name: LEIGHANNA KIRN MRN:  [id number] Date of Birth: 1950-03-29

## 2016-08-10 ENCOUNTER — Encounter: Payer: Self-pay | Admitting: Occupational Therapy

## 2016-08-10 ENCOUNTER — Ambulatory Visit: Payer: Medicare Other | Admitting: Occupational Therapy

## 2016-08-10 DIAGNOSIS — M25612 Stiffness of left shoulder, not elsewhere classified: Secondary | ICD-10-CM

## 2016-08-10 DIAGNOSIS — R29818 Other symptoms and signs involving the nervous system: Secondary | ICD-10-CM

## 2016-08-10 DIAGNOSIS — R293 Abnormal posture: Secondary | ICD-10-CM

## 2016-08-10 DIAGNOSIS — M25611 Stiffness of right shoulder, not elsewhere classified: Secondary | ICD-10-CM

## 2016-08-10 DIAGNOSIS — R2681 Unsteadiness on feet: Secondary | ICD-10-CM | POA: Diagnosis not present

## 2016-08-10 DIAGNOSIS — M6281 Muscle weakness (generalized): Secondary | ICD-10-CM

## 2016-08-10 DIAGNOSIS — M25622 Stiffness of left elbow, not elsewhere classified: Secondary | ICD-10-CM

## 2016-08-10 DIAGNOSIS — R278 Other lack of coordination: Secondary | ICD-10-CM

## 2016-08-10 NOTE — Therapy (Signed)
[facility] [address] [place], [place], [address] Phone: [phone]   Fax:  [phone]  Speech Language Pathology Treatment  Patient Details  Name: FRANKLYN DAMIEN MRN: [id number] Date of Birth: 1950/08/08 Referring Provider: Erick Colace, MD  Encounter Date: 08/09/2016      End of Session - 08/10/16 1434    Visit Number 41   Number of Visits 46   Date for SLP Re-Evaluation 09/10/16   SLP Start Time 1617   SLP Stop Time  1701   SLP Time Calculation (min) 44 min   Activity Tolerance Patient tolerated treatment well;Patient limited by lethargy      Past Medical History:  Diagnosis Date  . Arthritis    lt great toe  . Complication of anesthesia    pt has had headaches post op-did advise to hydra well preop  . Hair loss    right-sided  . History of exercise stress test    a. ETT (12/15) with 12:00 exercise, no ST changes, occasional PACs.   . Hyperlipidemia   . Insomnia   . Migraine headache   . Movement disorder    resltess in left legs  . Postmenopausal   . 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 Premature atrial contractions    a. Holter (12/15) with 8% PACs, no atrial runs or atrial fibrillation.     Past Surgical History:  Procedure Laterality Date  . BREAST BIOPSY  01/01/2011   Procedure: BREAST BIOPSY WITH NEEDLE LOCALIZATION;  Surgeon: Emelia Loron, MD;  Location: [facility];  Service: General;  Laterality: Left;  left breast wire localization biopsy  . BREAST BIOPSY Right 07/27/2012  . BREAST BIOPSY Right 07/07/2016  . BREAST BIOPSY Left 06/30/2016  . cataracts right eye    . CESAREAN SECTION  1986  . HERNIA REPAIR  2000   RIH  . opirectinal membrane peel    . PEG PLACEMENT    . RHINOPLASTY  1983  . TRACHEOSTOMY      There were no vitals filed for this visit.      Subjective Assessment - 08/09/16 1415    Subjective "We tried the blue one" (caregiver ON:[email or web address] trainer)    Special Tests spouse, [name], caregiver Misty Stanley   Currently in Pain? No/denies               ADULT SLP TREATMENT - 08/09/16 1616      General Information   Behavior/Cognition Cooperative;Pleasant mood;Other (comment);Alert;Requires cueing  lethargic, somnolent for last 5 minutes of session   Patient Positioning Upright in chair   Oral care provided Yes     Treatment Provided   Treatment provided Dysphagia;Cognitive-Linquistic     Dysphagia Treatment   Temperature Spikes Noted No   Respiratory Status Room air   Oral Cavity - Dentition Adequate natural dentition   Treatment Methods Skilled observation;Therapeutic exercise;Patient/caregiver education   Patient observed directly with PO's Yes   Type of PO's observed Thin liquids   Feeding Needs assist   Liquids provided via Cup   Oral Phase Signs & Symptoms Anterior loss/spillage   Pharyngeal Phase Signs & Symptoms Multiple swallows;Delayed cough   Type of cueing Verbal;Visual;Tactile   Amount of cueing Minimal   Other treatment/comments SLP performed oral care; caregiver demonstrates appropriate cuing for lip closure, reswallows. Pt with impulsive intake of thin liquids x1, SLP provided education to caregiver re: allowing pt to self-feed but assisting with rate control.     Pain Assessment  Pain Assessment No/denies pain     Cognitive-Linquistic Treatment   Treatment focused on Voice;Dysarthria   Skilled Treatment Pt initially required cues for arousal, however engaged with inspiratory trainer today with good coordination of breath timing, requiring consistent min A to ?opinch lips? and breathe in ?ohard and fast.? Initially began RMT at 11cm H20, increasing to 13 cm H20. For final 3  sets of 5 breaths, caregiver demo?Td appropriate cuing and pt achieved 90% accuracy with inspiratory exercises. Pt?Ts ability to use expiratory trainer remains limited primarily by loss of air pressure around tracheal stoma on exhalation.  Caregiver again educated to cease use of this device; while use will not harm pt, she is unlikely to gain any benefit in expiratory muscle training given limited ability to build and maintain sufficient pressure. SLP cued use of breath / speech timing with mixed success. Pt with decreased alertness, somnolence in last 5 minutes of treatment, responding to approximately 50% of simple questions with 1-3 word responses. When she responded, pt required cues to repeat her utterances and modeling for breath/speech coordination in 50% of attempts. Pt did respond, ?oShe is my best friend,? and ?oShe used to be our next door neighbor? with adequate breath/speech timing and vocal amplitude.     Tracheostomy Shiley 4 mm Uncuffed   Tracheostomy Properties Placement Date: 05/08/16 Placement Time: 1030 Placed By: ICU physician Brand: Shiley Size (mm): 4 mm Style: Uncuffed   Status Capped   Site Assessment Clean;Dry   Ties Assessment Clean;Dry;Secure     Assessment / Recommendations / Plan   Plan Continue with current plan of care     Dysphagia Recommendations   Diet recommendations Dysphagia 1 (puree);Thin liquid   Liquids provided via Teaspoon;Cup   Medication Administration Via alternative means   Supervision Full supervision/cueing for compensatory strategies   Compensations Effortful swallow;Multiple dry swallows after each bite/sip;Check for anterior loss;Small sips/bites;Slow rate;Check for pocketing   Postural Changes and/or Swallow Maneuvers Seated upright 90 degrees     Progression Toward Goals   Progression toward goals --  patient, family motivated for progress   Patient/Family Stated Goal Eat and drink by mouth, improve cough, build breath support for increased vocal amplitude          SLP Education - 08/09/16 1434    Education provided Yes   Education Details cuing for inspiratory training, pt unlikely to gain benefit from expiratory trainer at this time   Person(s) Educated  Patient;Spouse;Caregiver(s)   Methods Explanation;Demonstration;Verbal cues   Comprehension Verbalized understanding;Returned demonstration          SLP Short Term Goals - 08/10/16 1437      SLP SHORT TERM GOAL #1   Title Patient will utilize intelligibility strategies with moderate visual cues from communication partner/visual aid to communicate 5-7 word wants, needs, thoughts and ideas.   Status Achieved     SLP SHORT TERM GOAL #2   Title Patient will utilize intelligibility strategies, breath support with mod A for phrase repetition in 75% of trials.   Status Achieved     SLP SHORT TERM GOAL #3   Title Pt will swallow thin liquids, purees and soft solids in 9/10 trials with no overt signs of aspiration.   Status Achieved     SLP SHORT TERM GOAL #4   Title Patient will tolerate 6oz water, >95% with no overt signs of aspiration.   Status Achieved     SLP SHORT TERM GOAL #5   Title Patient will utilize verbal cues  for lip closure, dry swallow for secretion management in 75% of opportunities.   Status Achieved          SLP Long Term Goals - 08/10/16 1437      SLP LONG TERM GOAL #1   Title Patient will utilize multimodal communication/AAC to clarify wants, needs, thoughts or ideas during communication breakdowns with max A   Status Deferred     SLP LONG TERM GOAL #2   Title Patient will utilize intelligibility strategies, breath support with min A visual cues for 5-7 word responses with 80% accuracy.   Time 3   Period Weeks   Status On-going     SLP LONG TERM GOAL #3   Title Patient will demonstrate readiness for MBS to determine safety for pleasure feeds by timely initiation of swallow in 3/5 trials with ice chips/H20.   Status Achieved     SLP LONG TERM GOAL #4   Title Pt will consume pleasure feeds of desired consistency (soft solids and thin liquids) with signs of aspiration in fewer than 10% of trials with caregiver providing moderate cues and monitoring for  overt signs of aspiration.   Time 3   Period Weeks   Status On-going     SLP LONG TERM GOAL #5   Title Patient will utilize visual cue for lip closure, dry swallow for secretion management in 75% of opportunities.   Status Achieved     SLP LONG TERM GOAL #6   Title Pt will demo HEP for RMT, dysphagia with mod caregiver assistance.   Time 3   Period Weeks   Status On-going          Plan - 08/10/16 1435    Clinical Impression Statement Pt required initial cues for alertness this session and multiple cues at session?Ts end given somnolence with last 5 minutes of session. Pt performing inspiratory training exercises today with appropriate sequencing/timing/intensity with consistent min A. Dysphagia was targeted in today's session - see "skilled treatment" for details. Pt will benefit from continued skilled ST to address underlying physical impairments impacting swallowing and speech, as well as education and training in monitoring for and reducing risks of aspiration.   Speech Therapy Frequency 2x / week   Duration 4 weeks   Treatment/Interventions Aspiration precaution training;Trials of upgraded texture/liquids;Compensatory strategies;Functional tasks;Patient/family education;Cueing hierarchy;Diet toleration management by SLP;Cognitive reorganization;Compensatory techniques;SLP instruction and feedback;Internal/external aids;Multimodal communcation approach;Pharyngeal strengthening exercises   Potential to Achieve Goals Good   Potential Considerations Ability to learn/carryover information;Co-morbidities;Severity of impairments   SLP Home Exercise Plan Pleasure feeds purees, thin liquids, strict oral care before and after PO, monitor for signs of fatigue, aspiration. RMT   Consulted and Agree with Plan of Care Patient;Family member/caregiver   Family Member Consulted caregiver lisa, husband chip      Patient will benefit from skilled therapeutic intervention in order to improve the  following deficits and impairments:   Dysarthria and anarthria  Dysphagia, oropharyngeal phase  Cognitive communication deficit    Problem List Patient Active Problem List   Diagnosis Date Noted  . Malignant neoplasm of upper-inner quadrant of left breast in female, estrogen receptor positive ([facility]) 07/14/2016  . Heterotopic ossification of bone 03/10/2016  . Acute on chronic respiratory failure with hypoxia ([facility])   . Acute hypoxemic respiratory failure ([facility])   . Acute blood loss anemia   . Atelectasis 02/04/2016  . Debility 02/03/2016  . Pneumonia of both lower lobes due to Pseudomonas species ([facility])   . Pneumonia of both  lower lobes due to methicillin susceptible Staphylococcus aureus (MSSA) ([facility])   . Acute respiratory failure with hypoxia ([facility])   . Acute encephalopathy   . Seizures ([facility])   . Sepsis ([facility])   . Hypoxia   . Pain   . HCAP (healthcare-associated pneumonia) 12/28/2015  . Chronic respiratory failure ([facility]) 12/28/2015  . Acute tracheobronchitis 12/24/2015  . Tracheostomy tube present ([facility])   . Tracheal stenosis   . Chronic respiratory failure with hypoxia ([facility])   . Spastic tetraplegia ([facility]) 10/20/2015  . Tracheostomy status ([facility]) 09/26/2015  . Allergic rhinitis 09/26/2015  . 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 Movement disorder 09/22/2015  . Encephalitis 09/22/2015  . Chest pain 02/07/2014  . Premature atrial contractions 01/13/2014  . Abnormality of gait 12/04/2013  . Hyperlipidemia 12/21/2011  . Cardiovascular risk factor 12/21/2011  . Bunion 01/31/2008  . Metatarsalgia of both feet 09/20/2007  . FLAT FOOT 09/20/2007  . HALLUX RIGIDUS, ACQUIRED 09/20/2007   Rondel Baton, MS, CCC-SLP Speech-Language Pathologist  Arlana Lindau 08/10/2016, 2:39 PM  [facility] [address] [place], [place], [address] Phone: [phone]   Fax:  [phone]   Name: HAILEYMARIE LYE MRN:  [id number] Date of Birth: March 14, 1950

## 2016-08-10 NOTE — Therapy (Signed)
[facility] [address] [place], [place], [address] Phone: [phone]   Fax:  [phone]  Occupational Therapy Treatment  Patient Details  Name: Judy Spears MRN: [id number] Date of Birth: 07/08/1950 Referring Provider: Dr. Quita Skye. [name]  Encounter Date: 08/10/2016      OT End of Session - 08/10/16 1652    Visit Number 44   Number of Visits 62   Date for OT Re-Evaluation 09/07/16   Authorization Type [employer] unlimited visits   OT Start Time 8182   OT Stop Time 1530   OT Time Calculation (min) 43 min   Activity Tolerance Patient tolerated treatment well   Behavior During Therapy [facility] for tasks assessed/performed      Past Medical History:  Diagnosis Date  . Arthritis    lt great toe  . Complication of anesthesia    pt has had headaches post op-did advise to hydra well preop  . Hair loss    right-sided  . History of exercise stress test    a. ETT (12/15) with 12:00 exercise, no ST changes, occasional PACs.   . Hyperlipidemia   . Insomnia   . Migraine headache   . Movement disorder    resltess in left legs  . Postmenopausal   . 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 Premature atrial contractions    a. Holter (12/15) with 8% PACs, no atrial runs or atrial fibrillation.     Past Surgical History:  Procedure Laterality Date  . BREAST BIOPSY  01/01/2011   Procedure: BREAST BIOPSY WITH NEEDLE LOCALIZATION;  Surgeon: Rolm Bookbinder, MD;  Location: [facility];  Service: General;  Laterality: Left;  left breast wire localization biopsy  . BREAST BIOPSY Right 07/27/2012  . BREAST BIOPSY Right 07/07/2016  . BREAST BIOPSY Left 06/30/2016  . cataracts right eye    . CESAREAN SECTION  1986  . HERNIA REPAIR  2000   RIH  . opirectinal membrane peel    . PEG PLACEMENT    . RHINOPLASTY  1983  . TRACHEOSTOMY      There were no vitals filed for this visit.      Subjective Assessment - 08/10/16 1648     Subjective  Ready!   Patient is accompained by: Family member   Pertinent History see epic snapshot - ABI/encephalitis/hypoxia from virus; recent hospitalization due to ARF;    Patient Stated Goals Pt unable to state.    Currently in Pain? No/denies   Pain Score 0-No pain                      OT Treatments/Exercises (OP) - 08/10/16 0001      Neurological Re-education Exercises   Other Exercises 1 Focused on transitional movements incorporating components of lower body flexion and rotation today.  Patient initally needing mod assist to unweight right leg for kneeling to half kneeling transition, but with increased speed and repetition, able to complete with only min assist.  Patient had limited range in hips and knees to transition from kneeling to sidesitting, and reported this position became uncomfortable.  Discomfort immediately resolved with reposition, and husband and caregiver made aware that patient may report soreness in knees and hips after this transition today.                   OT Short Term Goals - 08/09/16 1638      OT SHORT TERM GOAL  #9   TITLE Pt and  family will be mod I with upgraded home activities program - 08/09/2016   Status Achieved     OT SHORT TERM GOAL  #10   TITLE Pt will be min a stand step toilet transfers at least 50% of the time using either grab bar or walker for community transfers   Status Achieved     OT SHORT TERM GOAL  #11   TITLE Pt will consistently move from sit to stand with no more than min a for functional mobility   Status Achieved     OT SHORT TERM GOAL  #12   TITLE Pt will be no more than mod a consistently for bathing at shower level   Status On-going           OT Long Term Goals - 08/09/16 1639      OT LONG TERM GOAL #1   Title Pt, husband and caregiver will be mod I with upgraded HEP prn - 04/26/2016   Status Achieved     OT LONG TERM GOAL #2   Title Pt, husband and caregiver will be mod I with  splint wear and care    Status Achieved     OT LONG TERM GOAL #3   Title Pt will be able to wash face with min a only   Status Achieved     OT LONG TERM GOAL #4   Title Pt will be able to sit without UE support with supevision 100% of the time with no more than 2 vc's while engaged in functional activity.   Status Achieved     OT LONG TERM GOAL #5   Title Pt, husband and caregiver will verbalize understanding of potential after care therapeutic opportunities. - 07/08/2016 date adjusted as pt missed 2 weeks due to hospitalization   Status Achieved     OT LONG TERM GOAL #6   Title Pt will be supervision to wash face consistently   Status Achieved     OT LONG TERM GOAL #7   Title Pt will don shirt with min a and max vc's 50% of the time   Status Achieved     OT LONG TERM GOAL #8   Title Pt will require min a for stand step transfers to toilet using grab bar or walker for community toileting at least 75% of the time. - 09/06/2016   Status On-going     OT LONG TERM GOAL  #9   Baseline Pt will stand with contact gurard with only 1 UE support for static standing balance in prep for abiilty to engage in functional task while standing.    Status Achieved     OT LONG TERM GOAL  #10   TITLE Pt will consistenly move from sit to stand with no more than CGA while caregiver assists with pulling up pants.    Status On-going     OT LONG TERM GOAL  #11   TITLE Pt will require no more than close supervision for static standing with bilateral UE while caregiver assists with hiking pants.    Status On-going     OT LONG TERM GOAL  #12   TITLE Pt will go from stand to sit during ADL activities with no more than min a.    Status On-going               Plan - 08/10/16 1653    Clinical Impression Statement Patient showing steady improvement in functional mobility which should ultimately lead to decreased caregiver  burdern with ADL.     Rehab Potential Fair   Current Impairments/barriers  affecting progress: severity of deficits, very slow rate of recovery; pt recently diagnosed with breast cancer - will monitor ability to tolerate therapy   OT Frequency 2x / week   OT Duration 8 weeks   OT Treatment/Interventions Self-care/ADL training;Ultrasound;Moist Heat;Electrical Stimulation;DME and/or AE instruction;Neuromuscular education;Therapeutic exercise;Functional Mobility Training;Manual Therapy;Passive range of motion;Splinting;Therapeutic activities;Balance training;Patient/family education;Visual/perceptual remediation/compensation;Cognitive remediation/compensation   Plan NMR trunk/ UE's/ Hips, functional mobility, stand to sit, static to dynamic stand balance with decreased relaince on UE's   OT Home Exercise Plan Reviewed plan for home management for left elbow range of motion - JAS 3x/day FOR 30 min./time, and locked elbow extesnion brace at night   Consulted and Agree with Plan of Care Patient;Family member/caregiver   Family Member Consulted caregiver Leesa, hsuband [name]      Patient will benefit from skilled therapeutic intervention in order to improve the following deficits and impairments:  Decreased endurance, Decreased skin integrity, Impaired vision/preception, Improper body mechanics, Impaired perceived functional ability, Decreased knowledge of precautions, Cardiopulmonary status limiting activity, Decreased activity tolerance, Decreased knowledge of use of DME, Decreased strength, Impaired flexibility, Improper spinal/pelvic alignment, Impaired sensation, Difficulty walking, Decreased mobility, Decreased balance, Decreased cognition, Decreased range of motion, Impaired tone, Impaired UE functional use, Decreased safety awareness, Decreased coordination, Pain  Visit Diagnosis: Unsteadiness on feet  Muscle weakness (generalized)  Abnormal posture  Other symptoms and signs involving the nervous system  Stiffness of left shoulder, not elsewhere  classified  Stiffness of right shoulder, not elsewhere classified  Stiffness of left elbow, not elsewhere classified  Other lack of coordination    Problem List Patient Active Problem List   Diagnosis Date Noted  . Malignant neoplasm of upper-inner quadrant of left breast in female, estrogen receptor positive ([place]) 07/14/2016  . Heterotopic ossification of bone 03/10/2016  . Acute on chronic respiratory failure with hypoxia ([place])   . Acute hypoxemic respiratory failure ([place])   . Acute blood loss anemia   . Atelectasis 02/04/2016  . Debility 02/03/2016  . Pneumonia of both lower lobes due to Pseudomonas species ([place])   . Pneumonia of both lower lobes due to methicillin susceptible Staphylococcus aureus (MSSA) ([place])   . Acute respiratory failure with hypoxia ([place])   . Acute encephalopathy   . Seizures ([place])   . Sepsis ([place])   . Hypoxia   . Pain   . HCAP (healthcare-associated pneumonia) 12/28/2015  . Chronic respiratory failure ([place]) 12/28/2015  . Acute tracheobronchitis 12/24/2015  . Tracheostomy tube present ([place])   . Tracheal stenosis   . Chronic respiratory failure with hypoxia ([place])   . Spastic tetraplegia ([place]) 10/20/2015  . Tracheostomy status ([place]) 09/26/2015  . Allergic rhinitis 09/26/2015  . 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 Movement disorder 09/22/2015  . Encephalitis 09/22/2015  . Chest pain 02/07/2014  . Premature atrial contractions 01/13/2014  . Abnormality of gait 12/04/2013  . Hyperlipidemia 12/21/2011  . Cardiovascular risk factor 12/21/2011  . Bunion 01/31/2008  . Metatarsalgia of both feet 09/20/2007  . FLAT FOOT 09/20/2007  . HALLUX RIGIDUS, ACQUIRED 09/20/2007    Mariah Milling, OTR/L 08/10/2016, 4:55 PM  [facility] [address] [place], [place], [address] Phone: [phone]   Fax:  [phone]  Name: Judy Spears MRN: [id number] Date of Birth: [birth year]

## 2016-08-11 ENCOUNTER — Other Ambulatory Visit: Payer: Self-pay | Admitting: Oncology

## 2016-08-11 ENCOUNTER — Ambulatory Visit: Payer: Medicare Other

## 2016-08-11 DIAGNOSIS — R2681 Unsteadiness on feet: Secondary | ICD-10-CM | POA: Diagnosis not present

## 2016-08-11 DIAGNOSIS — R1312 Dysphagia, oropharyngeal phase: Secondary | ICD-10-CM

## 2016-08-11 DIAGNOSIS — R41841 Cognitive communication deficit: Secondary | ICD-10-CM

## 2016-08-11 DIAGNOSIS — R471 Dysarthria and anarthria: Secondary | ICD-10-CM

## 2016-08-11 NOTE — Progress Notes (Unsigned)
I called [name] and [name] at home and left a message for them asking [name] to call me back so we can change the date from her visit from 8/9 to another day that is convenient for them.   The following is the pathology report from [facility]:  Final Diagnosis  A: Breast, left, Savi Scout-localized partial mastectomy - Invasive ductal carcinoma (see synoptic report) - Tumor size: 15 mm in greatest dimension - Ductal carcinoma in situ, nuclear grade 2, cribriform and solid solid types with microcalcifications - Biopsy site changes present - Margin status (see extended margins in specimens B - D) Invasive carcinoma: Negative, 0.4 mm from closest (posterior) margin Ductal carcinoma in situ: Negative, 0.2 mm from the closest (posterior) margin; 0.3 mm from lateral and medial margins - Ancillary studies reported on outside core biopsy [facility] accession [id number]) Estrogen receptor: Positive (100%) Progesterone receptor: Positive (5%) HER2 FISH: Not amplified HER2/CEP17 ratio: 0.72 Mean HER2 copy number: 1.05  B: Breast, left, superior margin, biopsy - Negative for carcinoma  C: Breast, left, medial margin, biopsy - Negative for carcinoma  D: Breast, left, lateral margin, biopsy - Negative for carcinoma  E: Breast, left, inferior margin, biopsy - Negative for carcinoma   Offered to fax or email a copy, which he declined. Emailed him an activation code to sign up for mychart. They will otherwise keep her post op appt as scheduled for 7/19 and will provide them with a copy of the report at that time.

## 2016-08-11 NOTE — Therapy (Signed)
[facility] [address] [place], [place], [address] Phone: [phone]   Fax:  [phone]  Speech Language Pathology Treatment  Patient Details  Name: Judy Spears MRN: [id number] Date of Birth: [birth year] Referring Provider: Charlett Blake, MD  Encounter Date: 08/11/2016      End of Session - 08/11/16 1729    Visit Number 42   Number of Visits 46   Date for SLP Re-Evaluation 09/10/16   SLP Start Time 1448   SLP Stop Time  1530   SLP Time Calculation (min) 42 min   Activity Tolerance Patient tolerated treatment well      Past Medical History:  Diagnosis Date  . Arthritis    lt great toe  . Complication of anesthesia    pt has had headaches post op-did advise to hydra well preop  . Hair loss    right-sided  . History of exercise stress test    a. ETT (12/15) with 12:00 exercise, no ST changes, occasional PACs.   . Hyperlipidemia   . Insomnia   . Migraine headache   . Movement disorder    resltess in left legs  . Postmenopausal   . 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 Premature atrial contractions    a. Holter (12/15) with 8% PACs, no atrial runs or atrial fibrillation.     Past Surgical History:  Procedure Laterality Date  . BREAST BIOPSY  01/01/2011   Procedure: BREAST BIOPSY WITH NEEDLE LOCALIZATION;  Surgeon: Rolm Bookbinder, MD;  Location: [facility];  Service: General;  Laterality: Left;  left breast wire localization biopsy  . BREAST BIOPSY Right 07/27/2012  . BREAST BIOPSY Right 07/07/2016  . BREAST BIOPSY Left 06/30/2016  . cataracts right eye    . CESAREAN SECTION  1986  . HERNIA REPAIR  2000   RIH  . opirectinal membrane peel    . PEG PLACEMENT    . RHINOPLASTY  1983  . TRACHEOSTOMY      There were no vitals filed for this visit.      Subjective Assessment - 08/11/16 1718    Subjective Pt arrives with nosey cup and IMT.   Patient is accompained by: Family member   chip, and lisa   Currently in Pain? No/denies               ADULT SLP TREATMENT - 08/11/16 1719      General Information   Behavior/Cognition Cooperative;Pleasant mood;Requires cueing   Patient Positioning Upright in chair     Treatment Provided   Treatment provided Dysphagia;Cognitive-Linquistic     Dysphagia Treatment   Temperature Spikes Noted No   Other treatment/comments Pt with cup sips throughout session today, with consistent cues needed for labial seal to decr anterior labial leakage. Pt req'd SLP cues 50% of the time for additional swallows given hydrophonic voice; pt swallowed additional swallow spontaneously the other 50% of the time when hydrophonic voice observed. Cough x1/8, delayed. Husband with appropriate cueing for labial seal consistently.      Cognitive-Linquistic Treatment   Treatment focused on Voice;Dysarthria   Skilled Treatment PSLP faciliated work for pt's breath support for speech with the inspiratory trainer today with good coordination of breath timing given consistent SLP verbal cues for timing of inhalation and extent of exhalation.  SLP initially began RMT at 11cm H20 ("bikepump" sound heard 80% of the time), then 12cmH2O (80% again), then increasing to 13 cm H2O (40% success with "bikepump" sound), for  final 3  sets of 5 breaths, When SLP decr'd frewquency of verbal cues pt's timing of exhalation/inhalation decreased considerably. Pt caregivers stated they were performing IMT the same way at home (consistent cues) with good success. Pt req'd consistent mod-max cues for breathing for breath/speech and then voicing coordination/volume in 80% of attempts.      Tracheostomy Shiley 4 mm Uncuffed   Tracheostomy Properties Placement Date: 05/08/16 Placement Time: 1030 Placed By: ICU physician Brand: Shiley Size (mm): 4 mm Style: Uncuffed     Assessment / Recommendations / Plan   Plan Continue with current plan of care     Progression Toward Goals    Progression toward goals --  family and pt are motivated towards success            SLP Short Term Goals - 08/10/16 1437      SLP SHORT TERM GOAL #1   Title Patient will utilize intelligibility strategies with moderate visual cues from communication partner/visual aid to communicate 5-7 word wants, needs, thoughts and ideas.   Status Achieved     SLP SHORT TERM GOAL #2   Title Patient will utilize intelligibility strategies, breath support with mod A for phrase repetition in 75% of trials.   Status Achieved     SLP SHORT TERM GOAL #3   Title Pt will swallow thin liquids, purees and soft solids in 9/10 trials with no overt signs of aspiration.   Status Achieved     SLP SHORT TERM GOAL #4   Title Patient will tolerate 6oz water, >95% with no overt signs of aspiration.   Status Achieved     SLP SHORT TERM GOAL #5   Title Patient will utilize verbal cues for lip closure, dry swallow for secretion management in 75% of opportunities.   Status Achieved          SLP Long Term Goals - 08/11/16 1731      SLP LONG TERM GOAL #1   Title Patient will utilize multimodal communication/AAC to clarify wants, needs, thoughts or ideas during communication breakdowns with max A   Status Deferred     SLP LONG TERM GOAL #2   Title Patient will utilize intelligibility strategies, breath support with min A visual cues for 5-7 word responses with 80% accuracy.   Time 3   Period Weeks   Status On-going     SLP LONG TERM GOAL #3   Title Patient will demonstrate readiness for MBS to determine safety for pleasure feeds by timely initiation of swallow in 3/5 trials with ice chips/H20.   Status Achieved     SLP LONG TERM GOAL #4   Title Pt will consume pleasure feeds of desired consistency (soft solids and thin liquids) with signs of aspiration in fewer than 10% of trials with caregiver providing moderate cues and monitoring for overt signs of aspiration.   Time 3   Period Weeks   Status  On-going     SLP LONG TERM GOAL #5   Title Patient will utilize visual cue for lip closure, dry swallow for secretion management in 75% of opportunities.   Status Achieved     SLP LONG TERM GOAL #6   Title Pt will demo HEP for RMT, dysphagia with mod caregiver assistance.   Time 3   Period Weeks   Status On-going          Plan - 08/11/16 1730    Clinical Impression Statement SLP skilled tx included treatment for dysphagia and for  dysarthria/voice in today's session. Please see "skilled intervention" for more details. Pt will benefit from continued skilled ST to address underlying physical impairments impacting swallowing and speech, as well as education and training in monitoring for and reducing risks of aspiration.   Speech Therapy Frequency 2x / week   Duration 4 weeks   Treatment/Interventions Aspiration precaution training;Trials of upgraded texture/liquids;Compensatory strategies;Functional tasks;Patient/family education;Cueing hierarchy;Diet toleration management by SLP;Cognitive reorganization;Compensatory techniques;SLP instruction and feedback;Internal/external aids;Multimodal communcation approach;Pharyngeal strengthening exercises   Potential to Achieve Goals Good   Potential Considerations Ability to learn/carryover information;Co-morbidities;Severity of impairments   SLP Home Exercise Plan Pleasure feeds purees, thin liquids, strict oral care before and after PO, monitor for signs of fatigue, aspiration. RMT   Consulted and Agree with Plan of Care Patient;Family member/caregiver   Family Member Consulted caregiver lisa, husband chip      Patient will benefit from skilled therapeutic intervention in order to improve the following deficits and impairments:   Dysarthria and anarthria  Dysphagia, oropharyngeal phase  Cognitive communication deficit    Problem List Patient Active Problem List   Diagnosis Date Noted  . Malignant neoplasm of upper-inner quadrant of  left breast in female, estrogen receptor positive ([place]) 07/14/2016  . Heterotopic ossification of bone 03/10/2016  . Acute on chronic respiratory failure with hypoxia ([place])   . Acute hypoxemic respiratory failure ([place])   . Acute blood loss anemia   . Atelectasis 02/04/2016  . Debility 02/03/2016  . Pneumonia of both lower lobes due to Pseudomonas species ([place])   . Pneumonia of both lower lobes due to methicillin susceptible Staphylococcus aureus (MSSA) ([place])   . Acute respiratory failure with hypoxia ([place])   . Acute encephalopathy   . Seizures ([place])   . Sepsis ([place])   . Hypoxia   . Pain   . HCAP (healthcare-associated pneumonia) 12/28/2015  . Chronic respiratory failure ([place]) 12/28/2015  . Acute tracheobronchitis 12/24/2015  . Tracheostomy tube present ([place])   . Tracheal stenosis   . Chronic respiratory failure with hypoxia ([place])   . Spastic tetraplegia ([place]) 10/20/2015  . Tracheostomy status ([place]) 09/26/2015  . Allergic rhinitis 09/26/2015  . 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 Movement disorder 09/22/2015  . Encephalitis 09/22/2015  . Chest pain 02/07/2014  . Premature atrial contractions 01/13/2014  . Abnormality of gait 12/04/2013  . Hyperlipidemia 12/21/2011  . Cardiovascular risk factor 12/21/2011  . Bunion 01/31/2008  . Metatarsalgia of both feet 09/20/2007  . FLAT FOOT 09/20/2007  . HALLUX RIGIDUS, ACQUIRED 09/20/2007    [facility] ,[place], CCC-SLP  08/11/2016, 5:32 PM  [facility] [address] [place], [place], [address] Phone: [phone]   Fax:  [phone]   Name: Judy Spears MRN: [id number] Date of Birth: 12-03-1950

## 2016-08-12 ENCOUNTER — Encounter: Payer: Medicare Other | Admitting: Speech Pathology

## 2016-08-12 ENCOUNTER — Encounter: Payer: Medicare Other | Admitting: Occupational Therapy

## 2016-08-12 ENCOUNTER — Encounter: Payer: Self-pay | Admitting: Rehabilitation

## 2016-08-12 ENCOUNTER — Ambulatory Visit: Payer: Medicare Other | Admitting: Rehabilitation

## 2016-08-12 ENCOUNTER — Other Ambulatory Visit: Payer: Self-pay | Admitting: Physical Medicine & Rehabilitation

## 2016-08-12 DIAGNOSIS — R29818 Other symptoms and signs involving the nervous system: Secondary | ICD-10-CM

## 2016-08-12 DIAGNOSIS — M6281 Muscle weakness (generalized): Secondary | ICD-10-CM

## 2016-08-12 DIAGNOSIS — R293 Abnormal posture: Secondary | ICD-10-CM

## 2016-08-12 DIAGNOSIS — R2681 Unsteadiness on feet: Secondary | ICD-10-CM

## 2016-08-12 DIAGNOSIS — R2689 Other abnormalities of gait and mobility: Secondary | ICD-10-CM

## 2016-08-12 NOTE — Therapy (Signed)
[facility] [address] [place], [place], [address] Phone: [phone]   Fax:  [phone]  Physical Therapy Treatment  Patient Details  Name: Judy Spears MRN: [id number] Date of Birth: 1950/04/13 Referring Provider: Alger Simons, MD/Andrew Letta Pate, MD  Encounter Date: 08/12/2016      PT End of Session - 08/12/16 1633    Visit Number 43   Number of Visits 110   Date for PT Re-Evaluation 08/23/16   Authorization Type BCBS   PT Start Time 1525   PT Stop Time 1615   PT Time Calculation (min) 50 min   Activity Tolerance Patient tolerated treatment well   Behavior During Therapy [facility] for tasks assessed/performed      Past Medical History:  Diagnosis Date  . Arthritis    lt great toe  . Complication of anesthesia    pt has had headaches post op-did advise to hydra well preop  . Hair loss    right-sided  . History of exercise stress test    a. ETT (12/15) with 12:00 exercise, no ST changes, occasional PACs.   . Hyperlipidemia   . Insomnia   . Migraine headache   . Movement disorder    resltess in left legs  . Postmenopausal   . 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 Premature atrial contractions    a. Holter (12/15) with 8% PACs, no atrial runs or atrial fibrillation.     Past Surgical History:  Procedure Laterality Date  . BREAST BIOPSY  01/01/2011   Procedure: BREAST BIOPSY WITH NEEDLE LOCALIZATION;  Surgeon: Rolm Bookbinder, MD;  Location: [facility];  Service: General;  Laterality: Left;  left breast wire localization biopsy  . BREAST BIOPSY Right 07/27/2012  . BREAST BIOPSY Right 07/07/2016  . BREAST BIOPSY Left 06/30/2016  . cataracts right eye    . CESAREAN SECTION  1986  . HERNIA REPAIR  2000   RIH  . opirectinal membrane peel    . PEG PLACEMENT    . RHINOPLASTY  1983  . TRACHEOSTOMY      There were no vitals filed for this visit.      Subjective Assessment - 08/12/16 1632     Subjective Spouse and Caregiver report that MD visit went well and pt is officially in "remission."  All precautions lifted.    Patient is accompained by: Family member   Pertinent History Precautions:  PEG.  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  Another round of botox in LLE on 02/06/16   Patient Stated Goals Per husband, "For [place] to be as independent as possible."   Currently in Pain? No/denies            NMR:  In // bars had pt work on finding midline posture with BUE support but with no support from PT.  Pt with increased forward weight shift today with emphasis on pt feeling weight transfer back to heels for more upright posture.  Requires intermittent min A to regain as she is unable to maintain, but is doing much better self correcting head/trunk, esp with use of mirror.  Also had pt work on stepping forward up onto 4" step, as well as stepping up backwards in order to work on not only upright posture but also improving ability to weight shift appropriately to carryover to gait.  Requires up to mod/max A when advancing LLE due to poor R lateral weight shift.  Ended session with gait x 70' with RW and min to mod A.  Note increased forward weight shift today and her moving RW too far ahead, needing assist to prevent forward weight shift and to control RW.  Worked more towards the end of her gait with emphasis on improved R lateral weight shift prior to stepping LLE which did improve forward weight shift somewhat, but still needing assist to control RW most of the time to prevent over forward translation.                       PT Education - 08/12/16 1633    Education provided Yes   Education Details continuing to walk at home with RW   Person(s) Educated Patient;Spouse;Caregiver(s)    Methods Explanation   Comprehension Verbalized understanding          PT Short Term Goals - 07/22/16 1322      PT SHORT TERM GOAL #1   Title Jay Back on pt's current manual wheelchair to be switched out to regular back; pt will demonstrate abilty to sit in wheelchair with upright with less supportive wheelchair back.  07-24-16   Baseline Jay Back on current wheelchair   Time 4   Period Weeks   Status Achieved     PT SHORT TERM GOAL #2   Title Pt will perform sit to stand from wheelchair to RW with CGA.  07-24-16   Baseline min/guard to min A 07/22/16   Time 4   Period Weeks   Status Partially Met     PT SHORT TERM GOAL #3   Title Family to obtain bil. platform RW to begin assisting pt with ambulation in her home environment.  07-24-16   Baseline met with B PFRW 07/22/16   Time 4   Period Weeks   Status Achieved     PT SHORT TERM GOAL #4   Title Family will report pt rolling from supine to Rt and Lt sides in hospital bed at home with supervision without use of bed rail with cues for positioning of LE's.  07-24-16   Baseline pt needs min assist at times - activity just demonstrated by pt on 06-24-16   Time 4   Period Weeks   Status New     PT SHORT TERM GOAL #5   Title Basic transfers from wheelchair to bed or commode with CGA.  07-24-16   Baseline min A 07/22/16 with RW   Time 4   Period Weeks   Status Partially Met     PT SHORT TERM GOAL #6   Title Amb. 29' with bil. platform RW with CGA to begin amb. at home with assistance safely.  07-24-16   Baseline 115' with BPFRW and now RW with min A 07/22/16   Time 4   Period Weeks   Status Partially Met           PT Long Term Goals - 06/25/16 1142      PT LONG TERM GOAL #1   Title Pt will perform supine rolling R and L at mod I level in order to indicate improved independence with bed mobility.  (Target Date: 06/25/16)/ 08-23-16  CONT. LTG til 08-23-16   Baseline inconsistent in performance - pt needs cues for LE flexion to  assist with pushing for rolling and cues for UE positioning   Time 8  Period Weeks   Status On-going     PT LONG TERM GOAL #2   Title Pt will perform sit>SL R or L modified independently in order to indicate improved independence with getting into bed.   08-23-16   Baseline min to mod assist needed - 06-24-16   Time 8   Status New     PT LONG TERM GOAL #3   Title Pt will transfer Lt/Rt sidelying to sitting with CGA for incr. independence with getting out of bed.  08-23-16   Baseline min to mod assist needed -06-24-16   Time 8   Period Weeks   Status New     PT LONG TERM GOAL #4   Title Pt will perform basic transfers with CGA to indicate improved functional independence.  08-23-16   Baseline min assist needed - 06-24-16   Time 8   Period Weeks   Status New     PT LONG TERM GOAL #5   Title Pt will perform static standing with bil. UE support with distant supervision for >/= 10" for improved standing balance.  08-23-16   Baseline close SBA to CGA needed - pt standing with bil UE support for 8-10" per caregiver's report -- 06-24-16   Time 8   Period Weeks   Status New     Additional Long Term Goals   Additional Long Term Goals Yes     PT LONG TERM GOAL #6   Title Pt will demonstrate ability to ambulate up to 25' w/ LRAD at min A level in order to assist with getting around home.  CONT LTG til 08-23-16; goal upgraded to 50'   Baseline bil. platform RW to be used in home   Time 8   Period Weeks   Status On-going     PT LONG TERM GOAL #7   Title Perform sit to stand and stand to sit to RW or to another object with CGA.  08-23-16   Baseline min assist needed - 06-24-16   Time 8   Period Weeks   Status New               Plan - 08/12/16 1638    Clinical Impression Statement Pt cleared of all precautions and is officially in remission for breast cancer.  Session began in // bars stepping up/down from small 4" step in order to address improved postural control and lateral weight  shifting along with controlled descent to carry over to improved gait.    Rehab Potential Fair   Clinical Impairments Affecting Rehab Potential Chronicity of condition, continued medical management, but has very strong family and financial support   PT Frequency 2x / week   PT Duration 8 weeks   PT Treatment/Interventions ADLs/Self Care Home Management;DME Instruction;Gait training;Stair training;Functional mobility training;Therapeutic activities;Patient/family education;Cognitive remediation;Neuromuscular re-education;Therapeutic exercise;Balance training;Orthotic Fit/Training;Wheelchair mobility training;Manual techniques;Splinting;Visual/perceptual remediation/compensation;Vestibular;Passive range of motion;Electrical Stimulation   PT Next Visit Plan cont gait, balance, transfer, bed mobility training; try technique for upright posture in standing (if pt able to tolerate), continue to speak with family regarding placing pt on hold as needed.    Consulted and Agree with Plan of Care Patient;Family member/caregiver   Family Member Consulted caregiver, [name] and husband [name]      Patient will benefit from skilled therapeutic intervention in order to improve the following deficits and impairments:  Abnormal gait, Decreased balance, Decreased endurance, Decreased cognition, Cardiopulmonary status limiting activity, Decreased activity tolerance, Decreased coordination, Decreased strength, Impaired flexibility, Impaired tone, Decreased mobility,  Increased muscle spasms, Impaired vision/preception, Decreased range of motion, Impaired UE functional use, Postural dysfunction, Improper body mechanics, Impaired perceived functional ability, Decreased knowledge of precautions, Decreased knowledge of use of DME  Visit Diagnosis: Unsteadiness on feet  Muscle weakness (generalized)  Abnormal posture  Other symptoms and signs involving the nervous system  Other abnormalities of gait and  mobility     Problem List Patient Active Problem List   Diagnosis Date Noted  . Malignant neoplasm of upper-inner quadrant of left breast in female, estrogen receptor positive ([place]) 07/14/2016  . Heterotopic ossification of bone 03/10/2016  . Acute on chronic respiratory failure with hypoxia ([place])   . Acute hypoxemic respiratory failure ([place])   . Acute blood loss anemia   . Atelectasis 02/04/2016  . Debility 02/03/2016  . Pneumonia of both lower lobes due to Pseudomonas species ([place])   . Pneumonia of both lower lobes due to methicillin susceptible Staphylococcus aureus (MSSA) ([place])   . Acute respiratory failure with hypoxia ([place])   . Acute encephalopathy   . Seizures ([place])   . Sepsis ([place])   . Hypoxia   . Pain   . HCAP (healthcare-associated pneumonia) 12/28/2015  . Chronic respiratory failure ([place]) 12/28/2015  . Acute tracheobronchitis 12/24/2015  . Tracheostomy tube present ([place])   . Tracheal stenosis   . Chronic respiratory failure with hypoxia ([place])   . Spastic tetraplegia ([place]) 10/20/2015  . Tracheostomy status ([place]) 09/26/2015  . Allergic rhinitis 09/26/2015  . 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 Movement disorder 09/22/2015  . Encephalitis 09/22/2015  . Chest pain 02/07/2014  . Premature atrial contractions 01/13/2014  . Abnormality of gait 12/04/2013  . Hyperlipidemia 12/21/2011  . Cardiovascular risk factor 12/21/2011  . Bunion 01/31/2008  . Metatarsalgia of both feet 09/20/2007  . FLAT FOOT 09/20/2007  . HALLUX RIGIDUS, ACQUIRED 09/20/2007    Cameron Sprang, PT, MPT [facility] [address] [place], [place], [address] Phone: [phone]   Fax:  [phone] 08/12/16, 4:57 PM  Name: Judy Spears MRN: [id number] Date of Birth: 1950-02-12

## 2016-08-14 ENCOUNTER — Telehealth: Payer: Self-pay

## 2016-08-14 NOTE — Telephone Encounter (Signed)
Called and left a message with a new appt due to dr Jana Hakim out of the office 8/9/189  Judy Spears

## 2016-08-16 ENCOUNTER — Encounter: Payer: Self-pay | Admitting: Rehabilitation

## 2016-08-16 ENCOUNTER — Ambulatory Visit: Payer: Medicare Other | Admitting: Rehabilitation

## 2016-08-16 ENCOUNTER — Ambulatory Visit: Payer: Medicare Other | Admitting: Speech Pathology

## 2016-08-16 DIAGNOSIS — R471 Dysarthria and anarthria: Secondary | ICD-10-CM

## 2016-08-16 DIAGNOSIS — M6281 Muscle weakness (generalized): Secondary | ICD-10-CM

## 2016-08-16 DIAGNOSIS — R41841 Cognitive communication deficit: Secondary | ICD-10-CM

## 2016-08-16 DIAGNOSIS — R1312 Dysphagia, oropharyngeal phase: Secondary | ICD-10-CM

## 2016-08-16 DIAGNOSIS — R2689 Other abnormalities of gait and mobility: Secondary | ICD-10-CM

## 2016-08-16 DIAGNOSIS — R2681 Unsteadiness on feet: Secondary | ICD-10-CM

## 2016-08-16 DIAGNOSIS — R29818 Other symptoms and signs involving the nervous system: Secondary | ICD-10-CM

## 2016-08-16 DIAGNOSIS — R293 Abnormal posture: Secondary | ICD-10-CM

## 2016-08-16 NOTE — Therapy (Signed)
[facility] [address] [place], [place], [address] Phone: [phone]   Fax:  [phone]  Physical Therapy Treatment  Patient Details  Name: Judy Spears MRN: [id number] Date of Birth: 09-23-1950 Referring Provider: Alger Simons, MD/Andrew Letta Pate, MD  Encounter Date: 08/16/2016      PT End of Session - 08/16/16 2031    Visit Number 99   Number of Visits 79   Date for PT Re-Evaluation 08/23/16   Authorization Type BCBS   PT Start Time 1536  PT late from previous session   PT Stop Time 1616   PT Time Calculation (min) 40 min   Activity Tolerance Patient tolerated treatment well   Behavior During Therapy [facility] for tasks assessed/performed      Past Medical History:  Diagnosis Date  . Arthritis    lt great toe  . Complication of anesthesia    pt has had headaches post op-did advise to hydra well preop  . Hair loss    right-sided  . History of exercise stress test    a. ETT (12/15) with 12:00 exercise, no ST changes, occasional PACs.   . Hyperlipidemia   . Insomnia   . Migraine headache   . Movement disorder    resltess in left legs  . Postmenopausal   . 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 Premature atrial contractions    a. Holter (12/15) with 8% PACs, no atrial runs or atrial fibrillation.     Past Surgical History:  Procedure Laterality Date  . BREAST BIOPSY  01/01/2011   Procedure: BREAST BIOPSY WITH NEEDLE LOCALIZATION;  Surgeon: Rolm Bookbinder, MD;  Location: [facility];  Service: General;  Laterality: Left;  left breast wire localization biopsy  . BREAST BIOPSY Right 07/27/2012  . BREAST BIOPSY Right 07/07/2016  . BREAST BIOPSY Left 06/30/2016  . cataracts right eye    . CESAREAN SECTION  1986  . HERNIA REPAIR  2000   RIH  . opirectinal membrane peel    . PEG PLACEMENT    . RHINOPLASTY  1983  . TRACHEOSTOMY      There were no vitals filed for this visit.      Subjective  Assessment - 08/16/16 2029    Subjective Caregiver [name] reports they are walking downstairs to restroom but not using RW.  She is having her place hands on caregivers shoulders for support.    Patient is accompained by: Family member   Pertinent History Precautions:  PEG.  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  Another round of botox in LLE on 02/06/16   Limitations House hold activities;Walking;Standing;Sitting   Patient Stated Goals Per husband, "For [name] to be as independent as possible."   Currently in Pain? No/denies           Self Care:  Continue to have conversation with spouse/caregiver regarding working towards placing pt on hold following next week.  When speaking with them regarding gait at home, note that they are not using RW to ambulate at home.  [name] reports sometimes using RW on the weekends when nurse is there to assist, however during the week, pt is busy with appts, visiting with friends and going out in w/c on walks with caregiver.  Educated on importance of carryover from therapy to home and that we have been encouraging  use of RW (first with platforms and then without) for at least one month.  Also continue to recommend walking with RW anytime she is ambulating at home.  Both verbalized understanding.  Remainder of session focused on setting up gait with RW to simulate negotiation from sitting area to bedside commode in office at home.  Note pt having to make 3 turns then turn to sit on bedside commode.  PT performed this with pt initially providing cues on assist and cues to pt for weight shift, posture and turning sequence.  Following this, had caregiver [name] return demonstration.  Note marked difficulty, esp with increased distractions.  PT provided hands on demonstration for how to best  assist pt under R axilla and around L pelvis/hip to promote posture and R lateral weight shift.  Also educated on providing adequate cues.  Note that during sit<>stand, pt able to stand at S level with PT/caregiver providing assist anterior to pt to stabilize RW while pt placed hands on front of RW to stand.  Recommend they do this at home in order to make a single person task.  Will continue to work on this in remaining sessions to answer questions as able.                        PT Education - 08/16/16 2031    Education provided Yes   Education Details See note   Person(s) Educated Patient;Spouse;Caregiver(s)   Methods Explanation;Demonstration   Comprehension Verbalized understanding;Returned demonstration;Verbal cues required;Tactile cues required;Need further instruction          PT Short Term Goals - 07/22/16 1322      PT SHORT TERM GOAL #1   Title Jay Back on pt's current manual wheelchair to be switched out to regular back; pt will demonstrate abilty to sit in wheelchair with upright with less supportive wheelchair back.  07-24-16   Baseline Jay Back on current wheelchair   Time 4   Period Weeks   Status Achieved     PT SHORT TERM GOAL #2   Title Pt will perform sit to stand from wheelchair to RW with CGA.  07-24-16   Baseline min/guard to min A 07/22/16   Time 4   Period Weeks   Status Partially Met     PT SHORT TERM GOAL #3   Title Family to obtain bil. platform RW to begin assisting pt with ambulation in her home environment.  07-24-16   Baseline met with B PFRW 07/22/16   Time 4   Period Weeks   Status Achieved     PT SHORT TERM GOAL #4   Title Family will report pt rolling from supine to Rt and Lt sides in hospital bed at home with supervision without use of bed rail with cues for positioning of LE's.  07-24-16   Baseline pt needs min assist at times - activity just demonstrated by pt on 06-24-16   Time 4   Period Weeks   Status New     PT SHORT TERM  GOAL #5   Title Basic transfers from wheelchair to bed or commode with CGA.  07-24-16   Baseline min A 07/22/16 with RW   Time 4   Period Weeks   Status Partially Met     PT SHORT TERM GOAL #6   Title Amb. 15' with bil. platform RW with CGA to begin amb. at home with assistance safely.  07-24-16   Baseline 115' with BPFRW and  now RW with min A 07/22/16   Time 4   Period Weeks   Status Partially Met           PT Long Term Goals - 06/25/16 1142      PT LONG TERM GOAL #1   Title Pt will perform supine rolling R and L at mod I level in order to indicate improved independence with bed mobility.  (Target Date: 06/25/16)/ 08-23-16  CONT. LTG til 08-23-16   Baseline inconsistent in performance - pt needs cues for LE flexion to assist with pushing for rolling and cues for UE positioning   Time 8   Period Weeks   Status On-going     PT LONG TERM GOAL #2   Title Pt will perform sit>SL R or L modified independently in order to indicate improved independence with getting into bed.   08-23-16   Baseline min to mod assist needed - 06-24-16   Time 8   Status New     PT LONG TERM GOAL #3   Title Pt will transfer Lt/Rt sidelying to sitting with CGA for incr. independence with getting out of bed.  08-23-16   Baseline min to mod assist needed -06-24-16   Time 8   Period Weeks   Status New     PT LONG TERM GOAL #4   Title Pt will perform basic transfers with CGA to indicate improved functional independence.  08-23-16   Baseline min assist needed - 06-24-16   Time 8   Period Weeks   Status New     PT LONG TERM GOAL #5   Title Pt will perform static standing with bil. UE support with distant supervision for >/= 10" for improved standing balance.  08-23-16   Baseline close SBA to CGA needed - pt standing with bil UE support for 8-10" per caregiver's report -- 06-24-16   Time 8   Period Weeks   Status New     Additional Long Term Goals   Additional Long Term Goals Yes     PT LONG TERM GOAL #6    Title Pt will demonstrate ability to ambulate up to 25' w/ LRAD at min A level in order to assist with getting around home.  CONT LTG til 08-23-16; goal upgraded to 50'   Baseline bil. platform RW to be used in home   Time 8   Period Weeks   Status On-going     PT LONG TERM GOAL #7   Title Perform sit to stand and stand to sit to RW or to another object with CGA.  08-23-16   Baseline min assist needed - 06-24-16   Time 8   Period Weeks   Status New               Plan - 08/16/16 2032    Clinical Impression Statement Note that pts spouse/caregiver reporting that they are not ambulating with pt with RW at home.  Continue to discuss working towards placing pt on hold following next week in order to have them work on this at home.  Note that spouse and caregiver educated many times to be walking at home with platform RW now regular RW, but have demonstrated very little carry over.  Continue to feel that pt needs break from therapy to work on these things at home to increase independence.     Rehab Potential Fair   Clinical Impairments Affecting Rehab Potential Chronicity of condition, continued medical management, but has very strong  family and financial support   PT Frequency 2x / week   PT Duration 8 weeks   PT Treatment/Interventions ADLs/Self Care Home Management;DME Instruction;Gait training;Stair training;Functional mobility training;Therapeutic activities;Patient/family education;Cognitive remediation;Neuromuscular re-education;Therapeutic exercise;Balance training;Orthotic Fit/Training;Wheelchair mobility training;Manual techniques;Splinting;Visual/perceptual remediation/compensation;Vestibular;Passive range of motion;Electrical Stimulation   PT Next Visit Plan Have [name]/[name] work on gait with her with RW to improve comfort in preparation for hold next week.     Consulted and Agree with Plan of Care Patient;Family member/caregiver   Family Member Consulted caregiver, [name] and husband  [name]      Patient will benefit from skilled therapeutic intervention in order to improve the following deficits and impairments:  Abnormal gait, Decreased balance, Decreased endurance, Decreased cognition, Cardiopulmonary status limiting activity, Decreased activity tolerance, Decreased coordination, Decreased strength, Impaired flexibility, Impaired tone, Decreased mobility, Increased muscle spasms, Impaired vision/preception, Decreased range of motion, Impaired UE functional use, Postural dysfunction, Improper body mechanics, Impaired perceived functional ability, Decreased knowledge of precautions, Decreased knowledge of use of DME  Visit Diagnosis: Unsteadiness on feet  Muscle weakness (generalized)  Other symptoms and signs involving the nervous system  Abnormal posture  Other abnormalities of gait and mobility     Problem List Patient Active Problem List   Diagnosis Date Noted  . Malignant neoplasm of upper-inner quadrant of left breast in female, estrogen receptor positive ([place]) 07/14/2016  . Heterotopic ossification of bone 03/10/2016  . Acute on chronic respiratory failure with hypoxia ([place])   . Acute hypoxemic respiratory failure ([place])   . Acute blood loss anemia   . Atelectasis 02/04/2016  . Debility 02/03/2016  . Pneumonia of both lower lobes due to Pseudomonas species ([place])   . Pneumonia of both lower lobes due to methicillin susceptible Staphylococcus aureus (MSSA) ([name])   . Acute respiratory failure with hypoxia ([place])   . Acute encephalopathy   . Seizures ([place])   . Sepsis ([place])   . Hypoxia   . Pain   . HCAP (healthcare-associated pneumonia) 12/28/2015  . Chronic respiratory failure ([place]) 12/28/2015  . Acute tracheobronchitis 12/24/2015  . Tracheostomy tube present ([place])   . Tracheal stenosis   . Chronic respiratory failure with hypoxia ([place])   . Spastic tetraplegia ([place]) 10/20/2015  . Tracheostomy status ([place]) 09/26/2015  . Allergic rhinitis 09/26/2015  .  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 Movement disorder 09/22/2015  . Encephalitis 09/22/2015  . Chest pain 02/07/2014  . Premature atrial contractions 01/13/2014  . Abnormality of gait 12/04/2013  . Hyperlipidemia 12/21/2011  . Cardiovascular risk factor 12/21/2011  . Bunion 01/31/2008  . Metatarsalgia of both feet 09/20/2007  . FLAT FOOT 09/20/2007  . HALLUX RIGIDUS, ACQUIRED 09/20/2007   Cameron Sprang, PT, MPT [facility] [address] [place], [place], [address] Phone: [phone]   Fax:  [phone] 08/16/16, 8:47 PM  Name: Judy Spears MRN: [id number] Date of Birth: 1950/03/16

## 2016-08-18 NOTE — Therapy (Signed)
[facility] [address] [place], [place], [address] Phone: [phone]   Fax:  [phone]  Speech Language Pathology Treatment  Patient Details  Name: Judy Spears MRN: [id number] Date of Birth: 01/01/1951 Referring Provider: Charlett Blake, MD  Encounter Date: 08/16/2016    Past Medical History:  Diagnosis Date  . Arthritis    lt great toe  . Complication of anesthesia    pt has had headaches post op-did advise to hydra well preop  . Hair loss    right-sided  . History of exercise stress test    a. ETT (12/15) with 12:00 exercise, no ST changes, occasional PACs.   . Hyperlipidemia   . Insomnia   . Migraine headache   . Movement disorder    resltess in left legs  . Postmenopausal   . 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 Premature atrial contractions    a. Holter (12/15) with 8% PACs, no atrial runs or atrial fibrillation.     Past Surgical History:  Procedure Laterality Date  . BREAST BIOPSY  01/01/2011   Procedure: BREAST BIOPSY WITH NEEDLE LOCALIZATION;  Surgeon: Rolm Bookbinder, MD;  Location: [facility];  Service: General;  Laterality: Left;  left breast wire localization biopsy  . BREAST BIOPSY Right 07/27/2012  . BREAST BIOPSY Right 07/07/2016  . BREAST BIOPSY Left 06/30/2016  . cataracts right eye    . CESAREAN SECTION  1986  . HERNIA REPAIR  2000   [facility]  . opirectinal membrane peel    . PEG PLACEMENT    . RHINOPLASTY  1983  . TRACHEOSTOMY      There were no vitals filed for this visit.   08/16/16 1620  SLP Visits / Re-Eval  Visit Number 43  Number of Visits 46  Date for SLP Re-Evaluation 09/10/16  SLP Time Calculation  SLP Start Time 1620  SLP Stop Time  8242  SLP Time Calculation (min) 45 min  SLP - End of Session  Activity Tolerance Patient tolerated treatment well     08/16/16 1620  Education  Education provided Yes  PT Education  Education Details see note   Methods Explanation  Comprehension Verbalized understanding;Need further instruction  Person(s) Educated Patient;Spouse;Caregiver(s)     08/16/16 1627  Symptoms/Limitations  Subjective Pt doing IMT at home per husband  Patient is accompained by: Family member  Pain Assessment  Currently in Pain? No/denies     08/16/16 1627  General Information  Behavior/Cognition Cooperative;Pleasant mood;Requires cueing  Patient Positioning Upright in chair  Oral care provided Yes  Treatment Provided  Treatment provided Cognitive-Linquistic;Dysphagia  Dysphagia Treatment  Temperature Spikes Noted No  Respiratory Status Room air  Oral Cavity - Dentition Adequate natural dentition  Treatment Methods Skilled observation;Therapeutic exercise;Patient/caregiver education  Patient observed directly with PO's Yes  Type of PO's observed Thin liquids  Feeding Able to feed self;Needs assist  Liquids provided via Cup  Oral Phase Signs & Symptoms Anterior loss/spillage  Pharyngeal Phase Signs & Symptoms Multiple swallows;Wet vocal quality;Immediate cough  Type of cueing Verbal;Visual;Tactile  Amount of cueing Moderate  Other treatment/comments SLP performed oral care to maximize safety for PO trials. Pt with overt signs of aspiration in 2/5 trials today, including immediate cough, wet vocal quality. Continued to provide education re: MBS as a "snapshot" of swallow function and pt is at increased risk for aspiration with fluctuations in attention, complexity of medical issues. Husband and caregiver have acknowledged risks of oral feeding and continue  to provide pt with mechanical soft solids, thin liquids at home despite SLP's prior recommendation for pureed solids. SLP recommended ceasing PO with significant overt signs of aspiration as she displayed today with thin liquids. Of concern is air pressure escape from pt's tracheal stoma noted with efforts including swallowing, coughing, voicing, or attempting  expiratory muscle training exercises. Suspect pt may have reduced ability to build up subglottic air pressure which may be impacting her swallow function. Pt has follow-up scheduled with pulmonology.  Cognitive-Linquistic Treatment  Treatment focused on Voice;Dysarthria  Skilled Treatment SLP faciliated pt's breath support for speech in short, structured conversation. Simple conversational responses of 2-10 words today with consistent min A for timing of breath/voicing and slowed rate (single word per breath). Husband and caregiver report performing IMT at home daily with good success (consistent cues).  Tracheostomy Shiley 4 mm Uncuffed  Placement Date/Time: 05/08/16 1030   Placed By: ICU physician  Brand: Shiley  Size (mm): 4 mm  Style: Uncuffed  Status Capped  Site Assessment Clean;Dry  Ties Assessment Clean;Dry;Secure  Assessment / Recommendations / Plan  Plan Continue with current plan of care  Dysphagia Recommendations  Diet recommendations Other(comment) (Caregivers feeding mech soft/thin liquids for comfort)  General Recommendations  General recommendations Other(comment) (f/u with pulmonology)  Oral Care Recommendations Oral care before and after PO;Staff/trained caregiver to provide oral care  Progression Toward Goals  Progression toward goals (pt, family motivated for improvement)  Potential to Achieve Goals Fair  Potential Considerations Severity of impairments;Ability to learn/carryover information     08/16/16 1620  SLP Assessment/Plan  Clinical Impression Statement SLP skilled tx included treatment for dysphagia and for dysarthria/voice in today's session. Please see "skilled intervention" for more details. Today discussed pt's progress and course of treatment with pt, husband and caregiver. Pt is progressing toward goals however anticipate she will continue to require consistent min cues. Caregivers demonstrating ability to cue pt for swallowing and speech strategies, as  well as assist in proper completion of HEP. At this time, pt's loss of air pressure from her tracheal stoma is limiting her ability to progress with expiratory muscle training and appears to be hampering her swallowing and voicing capabilities as well. Provided education re: plan for discharge in next 2 weeks, with recommendation to f/u with pulmonology. SLP has continued to provide education re: signs and risks of aspiration, close monitoring of pt's vitals given her moderate risk for aspiration, and ceasing pleasure feeds should pt display overt signs of aspiration. Pt will benefit from continued skilled ST to address underlying physical impairments impacting swallowing and speech, as well as education and training in monitoring for and reducing risks of aspiration.  Speech Therapy Frequency 2x / week  Duration 4 weeks  Treatment/Interventions Aspiration precaution training;Trials of upgraded texture/liquids;Compensatory strategies;Functional tasks;Patient/family education;Cueing hierarchy;Diet toleration management by SLP;Cognitive reorganization;Compensatory techniques;SLP instruction and feedback;Internal/external aids;Multimodal communcation approach;Pharyngeal strengthening exercises  Potential to Achieve Goals Fair  Potential Considerations Ability to learn/carryover information;Co-morbidities;Severity of impairments  SLP Home Exercise Plan Pleasure feeds purees, thin liquids, strict oral care before and after PO, monitor for signs of fatigue, aspiration. RMT  Consulted and Agree with Plan of Care Patient;Family member/caregiver  Family Member Consulted caregiver lisa, husband chip        ADULT SLP TREATMENT      Tracheostomy Shiley 4 mm Uncuffed   Tracheostomy Properties Placement Date: 05/08/16 Placement Time: 1030 Placed By: ICU physician Brand: Shiley Size (mm): 4 mm Style: Uncuffed  SLP Short Term Goals - 08/16/16 1620      SLP SHORT TERM GOAL #1   Title Patient will  utilize intelligibility strategies with moderate visual cues from communication partner/visual aid to communicate 5-7 word wants, needs, thoughts and ideas.   Status Achieved     SLP SHORT TERM GOAL #2   Title Patient will utilize intelligibility strategies, breath support with mod A for phrase repetition in 75% of trials.   Status Achieved     SLP SHORT TERM GOAL #3   Title Pt will swallow thin liquids, purees and soft solids in 9/10 trials with no overt signs of aspiration.   Status Achieved     SLP SHORT TERM GOAL #4   Title Patient will tolerate 6oz water, >95% with no overt signs of aspiration.   Status Achieved     SLP SHORT TERM GOAL #5   Title Patient will utilize verbal cues for lip closure, dry swallow for secretion management in 75% of opportunities.   Status Achieved          SLP Long Term Goals - 08/16/16 1639      SLP LONG TERM GOAL #1   Title Patient will utilize multimodal communication/AAC to clarify wants, needs, thoughts or ideas during communication breakdowns with max A   Status Deferred     SLP LONG TERM GOAL #2   Title Patient will utilize intelligibility strategies, breath support with min A verbal cues for 5-7 word responses with 80% accuracy.   Time 2   Period Weeks   Status Revised     SLP LONG TERM GOAL #3   Title Patient will demonstrate readiness for MBS to determine safety for pleasure feeds by timely initiation of swallow in 3/5 trials with ice chips/H20.   Status Achieved     SLP LONG TERM GOAL #4   Title Pt will consume pleasure feeds of desired consistency (soft solids and thin liquids) with signs of aspiration in fewer than 10% of trials with caregiver providing moderate cues and monitoring for overt signs of aspiration.   Status Deferred     SLP LONG TERM GOAL #5   Title Patient will utilize visual cue for lip closure, dry swallow for secretion management in 75% of opportunities.   Status Achieved     SLP LONG TERM GOAL #6   Title  Pt will demo HEP for RMT, dysphagia with min caregiver assistance.   Time 2   Period Weeks   Status Revised        Patient will benefit from skilled therapeutic intervention in order to improve the following deficits and impairments:   Dysphagia, oropharyngeal phase  Dysarthria and anarthria  Cognitive communication deficit    Problem List Patient Active Problem List   Diagnosis Date Noted  . Malignant neoplasm of upper-inner quadrant of left breast in female, estrogen receptor positive ([place]) 07/14/2016  . Heterotopic ossification of bone 03/10/2016  . Acute on chronic respiratory failure with hypoxia ([place])   . Acute hypoxemic respiratory failure ([place])   . Acute blood loss anemia   . Atelectasis 02/04/2016  . Debility 02/03/2016  . Pneumonia of both lower lobes due to Pseudomonas species ([place])   . Pneumonia of both lower lobes due to methicillin susceptible Staphylococcus aureus (MSSA) ([place])   . Acute respiratory failure with hypoxia ([place])   . Acute encephalopathy   . Seizures ([place])   . Sepsis ([place])   . Hypoxia   . Pain   . HCAP (  healthcare-associated pneumonia) 12/28/2015  . Chronic respiratory failure ([place]) 12/28/2015  . Acute tracheobronchitis 12/24/2015  . Tracheostomy tube present ([place])   . Tracheal stenosis   . Chronic respiratory failure with hypoxia ([place])   . 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 Tracheostomy status ([place]) 09/26/2015  . Allergic rhinitis 09/26/2015  . 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 Movement disorder 09/22/2015  . Encephalitis 09/22/2015  . Chest pain 02/07/2014  . Premature atrial contractions 01/13/2014  . Abnormality of gait 12/04/2013  . Hyperlipidemia 12/21/2011  . Cardiovascular risk factor 12/21/2011  . Bunion 01/31/2008  . Metatarsalgia of both feet 09/20/2007  . FLAT FOOT 09/20/2007  . HALLUX RIGIDUS, ACQUIRED 09/20/2007   Deneise Lever, [place], [place] Speech-Language Pathologist   Aliene Altes 08/18/2016, 8:34 PM  [facility] [address] [place], [place], [address] Phone: [phone]   Fax:  [phone]   Name: JOSUE KASS MRN: [id number] Date of Birth: [birth year]

## 2016-08-19 ENCOUNTER — Ambulatory Visit: Payer: Medicare Other | Admitting: Speech Pathology

## 2016-08-19 ENCOUNTER — Telehealth: Payer: Self-pay | Admitting: Adult Health

## 2016-08-19 ENCOUNTER — Ambulatory Visit: Payer: Medicare Other | Admitting: Rehabilitation

## 2016-08-19 ENCOUNTER — Encounter: Payer: Self-pay | Admitting: Rehabilitation

## 2016-08-19 DIAGNOSIS — R471 Dysarthria and anarthria: Secondary | ICD-10-CM

## 2016-08-19 DIAGNOSIS — R2681 Unsteadiness on feet: Secondary | ICD-10-CM | POA: Diagnosis not present

## 2016-08-19 DIAGNOSIS — R293 Abnormal posture: Secondary | ICD-10-CM

## 2016-08-19 DIAGNOSIS — R1312 Dysphagia, oropharyngeal phase: Secondary | ICD-10-CM

## 2016-08-19 DIAGNOSIS — R29818 Other symptoms and signs involving the nervous system: Secondary | ICD-10-CM

## 2016-08-19 DIAGNOSIS — R41841 Cognitive communication deficit: Secondary | ICD-10-CM

## 2016-08-19 DIAGNOSIS — M6281 Muscle weakness (generalized): Secondary | ICD-10-CM

## 2016-08-19 NOTE — Therapy (Signed)
[facility] [address] [place], [place], [address] Phone: [phone]   Fax:  [phone]  Physical Therapy Treatment  Patient Details  Name: Judy Spears MRN: [id number] Date of Birth: 11-13-1950 Referring Provider: Alger Simons, MD/Andrew Letta Pate, MD  Encounter Date: 08/19/2016      PT End of Session - 08/19/16 1724    Visit Number 45   Number of Visits 11   Date for PT Re-Evaluation 08/23/16   Authorization Type BCBS   PT Start Time 1447   PT Stop Time 1541   PT Time Calculation (min) 54 min   Activity Tolerance Patient tolerated treatment well   Behavior During Therapy [facility] for tasks assessed/performed      Past Medical History:  Diagnosis Date  . Arthritis    lt great toe  . Complication of anesthesia    pt has had headaches post op-did advise to hydra well preop  . Hair loss    right-sided  . History of exercise stress test    a. ETT (12/15) with 12:00 exercise, no ST changes, occasional PACs.   . Hyperlipidemia   . Insomnia   . Migraine headache   . Movement disorder    resltess in left legs  . Postmenopausal   . 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 Premature atrial contractions    a. Holter (12/15) with 8% PACs, no atrial runs or atrial fibrillation.     Past Surgical History:  Procedure Laterality Date  . BREAST BIOPSY  01/01/2011   Procedure: BREAST BIOPSY WITH NEEDLE LOCALIZATION;  Surgeon: Rolm Bookbinder, MD;  Location: [facility];  Service: General;  Laterality: Left;  left breast wire localization biopsy  . BREAST BIOPSY Right 07/27/2012  . BREAST BIOPSY Right 07/07/2016  . BREAST BIOPSY Left 06/30/2016  . cataracts right eye    . CESAREAN SECTION  1986  . HERNIA REPAIR  2000   RIH  . opirectinal membrane peel    . PEG PLACEMENT    . RHINOPLASTY  1983  . TRACHEOSTOMY      There were no vitals filed for this visit.      Subjective Assessment - 08/19/16 1714     Subjective Caregiver [name] reports pt having increased leg pain from hormone medication.  They plan to address with MD if this continues to prevent standing/gait.     Pertinent History Precautions:  PEG.  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  Another round of botox in LLE on 02/06/16   Limitations House hold activities;Walking;Standing;Sitting   Patient Stated Goals Per husband, "For Judy Spears to be as independent as possible."   Currently in Pain? Yes   Pain Score 4   feel that it was higher in standing   Pain Location Leg   Pain Orientation Right;Upper   Pain Descriptors / Indicators Aching   Pain Type Acute pain   Pain Onset In the past 7 days   Pain Frequency Intermittent   Aggravating Factors  standing, WB   Pain Relieving Factors sitting, icy hot                         [facility] Adult PT Treatment/Exercise - 08/19/16 0001      Bed Mobility   Bed Mobility Rolling Right;Rolling Left;Right Sidelying to Sit;Sit to  Sidelying Right   Rolling Right 5: Supervision  performed all bed mobility x 2 reps each   Rolling Right Details (indicate cue type and reason) S with cues for technique and for visual targeting as she was noted to have increased nystagmus/dizziness   Rolling Left 4: Min assist   Rolling Left Details (indicate cue type and reason) heavy cues for technique and sequencing as well as increased time to complete task.  Min a to guide legs to the L.    Right Sidelying to Sit 4: Min assist;3: Mod assist   Right Sidelying to Sit Details (indicate cue type and reason) Pt able to do very well initiating this movement, however once LEs are off of mat, she requires assist to prevent too much forward trunk flexion, assist at L hip to guide weight shift and heavy cues for R UE placement and use  to push as well as where to place LUE to guide weight shift.     Sit to Sidelying Right 4: Min guard   Sit to Sidelying Right Details (indicate cue type and reason) guiding A for LEs but pt able to manage trunk and elevating LEs to mat with cues.  Cues for self management of RUE to ensure she wasn't lying on arm.      Transfers   Transfers Sit to Stand;Stand to Sit;Squat Pivot Transfers   Sit to Stand 4: Min guard   Sit to Stand Details Tactile cues for weight shifting;Tactile cues for posture;Tactile cues for sequencing   Sit to Stand Details (indicate cue type and reason) Had pt place hands on front of walker as she does this at home while PT held RW stable and guiding weight shift as needed.  Cues for increased foward trunk flexion.     Stand to Sit 4: Min assist   Stand to Sit Details (indicate cue type and reason) Tactile cues for initiation;Tactile cues for sequencing;Tactile cues for weight shifting;Tactile cues for posture   Stand to Sit Details Continued cues for forward flexion.  Assisted somewhat more today due to pain in RLE.    Squat Pivot Transfers 4: Min assist;3: Mod assist   Squat Pivot Transfer Details (indicate cue type and reason) Performed beginning of session and pt requires mod A with heavy cues and facilitation for forward weight shift, hand placement and elevation from chair to mat.  During transfer back to w/c, had pt place her hands on PTs lap (for increased forward trunk flexion) and note marked improvement with PT only guiding at shoulders for forward weight shift and cues for lateral movement to chair.     Comments Unable to continue in standing due to increased pain in RLE.       Neuro Re-ed    Neuro Re-ed Details  Performed 5 reps of forward weight shift onto LEs with mini stand.  Had pt place her hands on PTs lap (as far as she could reach) for increased forward weight shift with PT guiding only at shoulders and note marked improvenet with ability to load LEs and  elevate to mini stand.  Continued this with last squat pivot transfer.                  PT Education - 08/19/16 1724    Education provided Yes   Education Details education on repetitive bed mobility for improved technique and habituation   Person(s) Educated Patient;Caregiver(s)   Methods Explanation   Comprehension Verbalized understanding  PT Short Term Goals - 07/22/16 1322      PT SHORT TERM GOAL #1   Title Jay Back on pt's current manual wheelchair to be switched out to regular back; pt will demonstrate abilty to sit in wheelchair with upright with less supportive wheelchair back.  07-24-16   Baseline Jay Back on current wheelchair   Time 4   Period Weeks   Status Achieved     PT SHORT TERM GOAL #2   Title Pt will perform sit to stand from wheelchair to RW with CGA.  07-24-16   Baseline min/guard to min A 07/22/16   Time 4   Period Weeks   Status Partially Met     PT SHORT TERM GOAL #3   Title Family to obtain bil. platform RW to begin assisting pt with ambulation in her home environment.  07-24-16   Baseline met with B PFRW 07/22/16   Time 4   Period Weeks   Status Achieved     PT SHORT TERM GOAL #4   Title Family will report pt rolling from supine to Rt and Lt sides in hospital bed at home with supervision without use of bed rail with cues for positioning of LE's.  07-24-16   Baseline pt needs min assist at times - activity just demonstrated by pt on 06-24-16   Time 4   Period Weeks   Status New     PT SHORT TERM GOAL #5   Title Basic transfers from wheelchair to bed or commode with CGA.  07-24-16   Baseline min A 07/22/16 with RW   Time 4   Period Weeks   Status Partially Met     PT SHORT TERM GOAL #6   Title Amb. 49' with bil. platform RW with CGA to begin amb. at home with assistance safely.  07-24-16   Baseline 115' with BPFRW and now RW with min A 07/22/16   Time 4   Period Weeks   Status Partially Met           PT Long Term Goals -  06/25/16 1142      PT LONG TERM GOAL #1   Title Pt will perform supine rolling R and L at mod I level in order to indicate improved independence with bed mobility.  (Target Date: 06/25/16)/ 08-23-16  CONT. LTG til 08-23-16   Baseline inconsistent in performance - pt needs cues for LE flexion to assist with pushing for rolling and cues for UE positioning   Time 8   Period Weeks   Status On-going     PT LONG TERM GOAL #2   Title Pt will perform sit>SL R or L modified independently in order to indicate improved independence with getting into bed.   08-23-16   Baseline min to mod assist needed - 06-24-16   Time 8   Status New     PT LONG TERM GOAL #3   Title Pt will transfer Lt/Rt sidelying to sitting with CGA for incr. independence with getting out of bed.  08-23-16   Baseline min to mod assist needed -06-24-16   Time 8   Period Weeks   Status New     PT LONG TERM GOAL #4   Title Pt will perform basic transfers with CGA to indicate improved functional independence.  08-23-16   Baseline min assist needed - 06-24-16   Time 8   Period Weeks   Status New     PT LONG TERM GOAL #5  Title Pt will perform static standing with bil. UE support with distant supervision for >/= 10" for improved standing balance.  08-23-16   Baseline close SBA to CGA needed - pt standing with bil UE support for 8-10" per caregiver's report -- 06-24-16   Time 8   Period Weeks   Status New     Additional Long Term Goals   Additional Long Term Goals Yes     PT LONG TERM GOAL #6   Title Pt will demonstrate ability to ambulate up to 25' w/ LRAD at min A level in order to assist with getting around home.  CONT LTG til 08-23-16; goal upgraded to 50'   Baseline bil. platform RW to be used in home   Time 8   Period Weeks   Status On-going     PT LONG TERM GOAL #7   Title Perform sit to stand and stand to sit to RW or to another object with CGA.  08-23-16   Baseline min assist needed - 06-24-16   Time 8   Period Weeks    Status New               Plan - 08/19/16 1725    Clinical Impression Statement Skilled session focused on working towards [place] with bed mobility as she was unable to perform standing/gait due to pain from hormone pills.  Pt continues to require at least min A for most aspects of bed mobility, but did demonstrate improved use of BLEs for sit<>mini stand during session.     Rehab Potential Fair   Clinical Impairments Affecting Rehab Potential Chronicity of condition, continued medical management, but has very strong family and financial support   PT Frequency 2x / week   PT Duration 8 weeks   PT Treatment/Interventions ADLs/Self Care Home Management;DME Instruction;Gait training;Stair training;Functional mobility training;Therapeutic activities;Patient/family education;Cognitive remediation;Neuromuscular re-education;Therapeutic exercise;Balance training;Orthotic Fit/Training;Wheelchair mobility training;Manual techniques;Splinting;Visual/perceptual remediation/compensation;Vestibular;Passive range of motion;Electrical Stimulation   PT Next Visit Plan Have [name]/[name] work on gait with her with RW to improve comfort in preparation for hold this week.     Consulted and Agree with Plan of Care Patient;Family member/caregiver   Family Member Consulted caregiver, [name] and husband [name]      Patient will benefit from skilled therapeutic intervention in order to improve the following deficits and impairments:  Abnormal gait, Decreased balance, Decreased endurance, Decreased cognition, Cardiopulmonary status limiting activity, Decreased activity tolerance, Decreased coordination, Decreased strength, Impaired flexibility, Impaired tone, Decreased mobility, Increased muscle spasms, Impaired vision/preception, Decreased range of motion, Impaired UE functional use, Postural dysfunction, Improper body mechanics, Impaired perceived functional ability, Decreased knowledge of precautions, Decreased knowledge of  use of DME  Visit Diagnosis: Unsteadiness on feet  Muscle weakness (generalized)  Other symptoms and signs involving the nervous system  Abnormal posture     Problem List Patient Active Problem List   Diagnosis Date Noted  . Malignant neoplasm of upper-inner quadrant of left breast in female, estrogen receptor positive ([place]) 07/14/2016  . Heterotopic ossification of bone 03/10/2016  . Acute on chronic respiratory failure with hypoxia ([place])   . Acute hypoxemic respiratory failure ([place])   . Acute blood loss anemia   . Atelectasis 02/04/2016  . Debility 02/03/2016  . Pneumonia of both lower lobes due to Pseudomonas species ([place])   . Pneumonia of both lower lobes due to methicillin susceptible Staphylococcus aureus (MSSA) ([place])   . Acute respiratory failure with hypoxia ([place])   . Acute encephalopathy   . Seizures (  [place])   . Sepsis ([place])   . Hypoxia   . Pain   . HCAP (healthcare-associated pneumonia) 12/28/2015  . Chronic respiratory failure ([place]) 12/28/2015  . Acute tracheobronchitis 12/24/2015  . Tracheostomy tube present ([place])   . Tracheal stenosis   . Chronic respiratory failure with hypoxia ([place])   . Spastic tetraplegia ([place]) 10/20/2015  . Tracheostomy status ([place]) 09/26/2015  . Allergic rhinitis 09/26/2015  . 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 Movement disorder 09/22/2015  . Encephalitis 09/22/2015  . Chest pain 02/07/2014  . Premature atrial contractions 01/13/2014  . Abnormality of gait 12/04/2013  . Hyperlipidemia 12/21/2011  . Cardiovascular risk factor 12/21/2011  . Bunion 01/31/2008  . Metatarsalgia of both feet 09/20/2007  . FLAT FOOT 09/20/2007  . HALLUX RIGIDUS, ACQUIRED 09/20/2007    Cameron Sprang, PT, MPT [facility] [address] [place], [place], [address] Phone: [phone]   Fax:  [phone] 08/19/16, 8:03 PM  Name: Judy Spears MRN: [id number] Date of Birth: Jun 03, 1950

## 2016-08-19 NOTE — Telephone Encounter (Signed)
Spoke with pt's husband, [name]. He was under the impression that the pt had an appointment with TP on 08/23/2016 at 10:15am. TP is not in the office on the morning of 08/23/2016. Pt has been scheduled to see TP on 09/21/2016 at 10:15am. Nothing further was needed.

## 2016-08-19 NOTE — Therapy (Signed)
[facility] [address] [place], [place], [address] Phone: [phone]   Fax:  [phone]  Speech Language Pathology Treatment  Patient Details  Name: Judy Spears MRN: [id number] Date of Birth: Feb 12, 1950 Referring Provider: Charlett Blake, MD  Encounter Date: 08/19/2016      End of Session - 08/19/16 1605    Visit Number 44   Number of Visits 46   Date for SLP Re-Evaluation 09/10/16   SLP Start Time 8   SLP Stop Time  1449   SLP Time Calculation (min) 47 min   Activity Tolerance Patient tolerated treatment well;Patient limited by lethargy      Past Medical History:  Diagnosis Date  . Arthritis    lt great toe  . Complication of anesthesia    pt has had headaches post op-did advise to hydra well preop  . Hair loss    right-sided  . History of exercise stress test    a. ETT (12/15) with 12:00 exercise, no ST changes, occasional PACs.   . Hyperlipidemia   . Insomnia   . Migraine headache   . Movement disorder    resltess in left legs  . Postmenopausal   . 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 Premature atrial contractions    a. Holter (12/15) with 8% PACs, no atrial runs or atrial fibrillation.     Past Surgical History:  Procedure Laterality Date  . BREAST BIOPSY  01/01/2011   Procedure: BREAST BIOPSY WITH NEEDLE LOCALIZATION;  Surgeon: Rolm Bookbinder, MD;  Location: [facility];  Service: General;  Laterality: Left;  left breast wire localization biopsy  . BREAST BIOPSY Right 07/27/2012  . BREAST BIOPSY Right 07/07/2016  . BREAST BIOPSY Left 06/30/2016  . cataracts right eye    . CESAREAN SECTION  1986  . HERNIA REPAIR  2000   RIH  . opirectinal membrane peel    . PEG PLACEMENT    . RHINOPLASTY  1983  . TRACHEOSTOMY      There were no vitals filed for this visit.      Subjective Assessment - 08/19/16 1408    Subjective "All right," hydrophonic   Patient is accompained by:  Family member   Special Tests caregiver [name]   Currently in Pain? No/denies               ADULT SLP TREATMENT - 08/19/16 1402      General Information   Behavior/Cognition Cooperative;Pleasant mood;Requires cueing;Lethargic   Patient Positioning Upright in chair   Oral care provided N/A     Treatment Provided   Treatment provided Cognitive-Linquistic     Pain Assessment   Pain Assessment No/denies pain     Cognitive-Linquistic Treatment   Treatment focused on Voice;Dysarthria   Skilled Treatment SLP faciliated pt's breath support for speech in short, structured conversation. Simple conversational responses of 1-7 words today with consistent min-mod A for timing of breath/voicing and swallow to clear vocal quality prior to speech. Pt limited by lethargy today, requiring intermittent cues for arousal, attention. Caregiver reports performing IMT at home x2 with good success (consistent cues). Today SLP provided skilled feedback to caregiver during assistance with IMT, SLP provided feedback on technique, occasional cues for swallow, lip closure and education re: adjustment of intensity. Pt completed 3 sets of 5 reps at 13cm H20, 2 sets of 5 at 14cm H20, with consistent min-mod caregiver cues.     Tracheostomy Shiley 4 mm Uncuffed   Tracheostomy Properties  Placement Date: 05/08/16 Placement Time: 27 Placed By: ICU physician Brand: Shiley Size (mm): 4 mm Style: Uncuffed   Status Capped   Site Assessment Clean;Dry   Ties Assessment Clean;Dry;Secure     Assessment / Recommendations / Plan   Plan Continue with current plan of care     Progression Toward Goals   Progression toward goals --  pt, caregivers motivated for improvement     General Information   Reason PO's not observed Other (comment)  Pt declined PO, no reason specified. Dysarthria tx only          SLP Education - 08/19/16 5102    Education provided Yes   Education Details swallow between IMT sets to improve  labial seal   Person(s) Educated Patient;Caregiver(s)   Methods Explanation;Demonstration;Verbal cues   Comprehension Verbalized understanding          SLP Short Term Goals - 08/19/16 1433      SLP SHORT TERM GOAL #1   Title Patient will utilize intelligibility strategies with moderate visual cues from communication partner/visual aid to communicate 5-7 word wants, needs, thoughts and ideas.   Status Achieved     SLP SHORT TERM GOAL #2   Title Patient will utilize intelligibility strategies, breath support with mod A for phrase repetition in 75% of trials.   Status Achieved     SLP SHORT TERM GOAL #3   Title Pt will swallow thin liquids, purees and soft solids in 9/10 trials with no overt signs of aspiration.   Status Achieved     SLP SHORT TERM GOAL #4   Title Patient will tolerate 6oz water, >95% with no overt signs of aspiration.   Status Achieved     SLP SHORT TERM GOAL #5   Title Patient will utilize verbal cues for lip closure, dry swallow for secretion management in 75% of opportunities.   Status Achieved          SLP Long Term Goals - 08/19/16 1434      SLP LONG TERM GOAL #1   Title Patient will utilize multimodal communication/AAC to clarify wants, needs, thoughts or ideas during communication breakdowns with max A   Status Deferred     SLP LONG TERM GOAL #2   Title Patient will utilize intelligibility strategies, breath support with min A verbal cues for 5-7 word responses with 80% accuracy.   Time 2   Period Weeks   Status On-going     SLP LONG TERM GOAL #3   Title Patient will demonstrate readiness for MBS to determine safety for pleasure feeds by timely initiation of swallow in 3/5 trials with ice chips/H20.   Status Achieved     SLP LONG TERM GOAL #4   Title Pt will consume pleasure feeds of desired consistency (soft solids and thin liquids) with signs of aspiration in fewer than 10% of trials with caregiver providing moderate cues and monitoring for  overt signs of aspiration.   Status Deferred     SLP LONG TERM GOAL #5   Title Patient will utilize visual cue for lip closure, dry swallow for secretion management in 75% of opportunities.   Status Achieved     SLP LONG TERM GOAL #6   Title Pt will demo HEP for RMT, dysphagia with min caregiver assistance.   Time 2   Period Weeks   Status On-going          Plan - 08/19/16 1608    Clinical Impression Statement SLP skilled tx targeted  dysarthria/voice only in today's session. Pt declined PO today. Limited by lethargy this session, requiring intermittent cues for arousal, attention. Demo'd IMT HEP today with min-mod caregiver assistance and intermittent SLP feedback for technique. Pt will benefit from continued skilled ST to address underlying physical impairments impacting swallowing and speech, as well as education and training in monitoring for and reducing risks of aspiration.   Speech Therapy Frequency 2x / week   Duration 4 weeks   Treatment/Interventions Aspiration precaution training;Trials of upgraded texture/liquids;Compensatory strategies;Functional tasks;Patient/family education;Cueing hierarchy;Diet toleration management by SLP;Cognitive reorganization;Compensatory techniques;SLP instruction and feedback;Internal/external aids;Multimodal communcation approach;Pharyngeal strengthening exercises   Potential to Achieve Goals Fair   Potential Considerations Ability to learn/carryover information;Co-morbidities;Severity of impairments   SLP Home Exercise Plan Pleasure feeds purees, thin liquids, strict oral care before and after PO, monitor for signs of fatigue, aspiration. RMT   Consulted and Agree with Plan of Care Patient;Family member/caregiver   Family Member Consulted caregiver lisa, husband chip      Patient will benefit from skilled therapeutic intervention in order to improve the following deficits and impairments:   Cognitive communication deficit  Dysarthria and  anarthria  Dysphagia, oropharyngeal phase    Problem List Patient Active Problem List   Diagnosis Date Noted  . Malignant neoplasm of upper-inner quadrant of left breast in female, estrogen receptor positive ([place]) 07/14/2016  . Heterotopic ossification of bone 03/10/2016  . Acute on chronic respiratory failure with hypoxia ([place])   . Acute hypoxemic respiratory failure ([place])   . Acute blood loss anemia   . Atelectasis 02/04/2016  . Debility 02/03/2016  . Pneumonia of both lower lobes due to Pseudomonas species ([place])   . Pneumonia of both lower lobes due to methicillin susceptible Staphylococcus aureus (MSSA) ([place])   . Acute respiratory failure with hypoxia ([place])   . Acute encephalopathy   . Seizures ([place])   . Sepsis ([place])   . Hypoxia   . Pain   . HCAP (healthcare-associated pneumonia) 12/28/2015  . Chronic respiratory failure ([place]) 12/28/2015  . Acute tracheobronchitis 12/24/2015  . Tracheostomy tube present ([place])   . Tracheal stenosis   . Chronic respiratory failure with hypoxia ([place])   . Spastic tetraplegia ([place]) 10/20/2015  . Tracheostomy status ([place]) 09/26/2015  . Allergic rhinitis 09/26/2015  . 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 Movement disorder 09/22/2015  . Encephalitis 09/22/2015  . Chest pain 02/07/2014  . Premature atrial contractions 01/13/2014  . Abnormality of gait 12/04/2013  . Hyperlipidemia 12/21/2011  . Cardiovascular risk factor 12/21/2011  . Bunion 01/31/2008  . Metatarsalgia of both feet 09/20/2007  . FLAT FOOT 09/20/2007  . HALLUX RIGIDUS, ACQUIRED 09/20/2007   Deneise Lever, [place], [place] Speech-Language Pathologist  Aliene Altes 08/19/2016, 4:11 PM  [facility] [address] [place], [place], [address] Phone: [phone]   Fax:  [phone]   Name: Judy Spears MRN: [id number] Date of Birth: 10/10/1950

## 2016-08-23 ENCOUNTER — Ambulatory Visit: Payer: Medicare Other | Admitting: Occupational Therapy

## 2016-08-23 ENCOUNTER — Ambulatory Visit: Payer: Medicare Other | Admitting: Speech Pathology

## 2016-08-23 ENCOUNTER — Encounter: Payer: Self-pay | Admitting: Occupational Therapy

## 2016-08-23 ENCOUNTER — Ambulatory Visit: Payer: Medicare Other | Admitting: Physical Therapy

## 2016-08-23 DIAGNOSIS — R2689 Other abnormalities of gait and mobility: Secondary | ICD-10-CM

## 2016-08-23 DIAGNOSIS — M25611 Stiffness of right shoulder, not elsewhere classified: Secondary | ICD-10-CM

## 2016-08-23 DIAGNOSIS — R29818 Other symptoms and signs involving the nervous system: Secondary | ICD-10-CM

## 2016-08-23 DIAGNOSIS — M6281 Muscle weakness (generalized): Secondary | ICD-10-CM

## 2016-08-23 DIAGNOSIS — M25612 Stiffness of left shoulder, not elsewhere classified: Secondary | ICD-10-CM

## 2016-08-23 DIAGNOSIS — R278 Other lack of coordination: Secondary | ICD-10-CM

## 2016-08-23 DIAGNOSIS — R471 Dysarthria and anarthria: Secondary | ICD-10-CM

## 2016-08-23 DIAGNOSIS — M25622 Stiffness of left elbow, not elsewhere classified: Secondary | ICD-10-CM

## 2016-08-23 DIAGNOSIS — R41841 Cognitive communication deficit: Secondary | ICD-10-CM

## 2016-08-23 DIAGNOSIS — R1312 Dysphagia, oropharyngeal phase: Secondary | ICD-10-CM

## 2016-08-23 DIAGNOSIS — R41844 Frontal lobe and executive function deficit: Secondary | ICD-10-CM

## 2016-08-23 DIAGNOSIS — R293 Abnormal posture: Secondary | ICD-10-CM

## 2016-08-23 DIAGNOSIS — R2681 Unsteadiness on feet: Secondary | ICD-10-CM

## 2016-08-23 NOTE — Therapy (Signed)
[facility] [address] [place], [place], [address] Phone: [phone]   Fax:  [phone]  Speech Language Pathology Treatment  Patient Details  Name: Judy Spears MRN: [id number] Date of Birth: 04/13/1950 Referring Provider: Charlett Blake, MD  Encounter Date: 08/23/2016      End of Session - 08/23/16 1935    Visit Number 45   Number of Visits 46   Date for SLP Re-Evaluation 09/10/16   SLP Start Time 1316   SLP Stop Time  1354   SLP Time Calculation (min) 38 min   Activity Tolerance Patient limited by lethargy      Past Medical History:  Diagnosis Date  . Arthritis    lt great toe  . Complication of anesthesia    pt has had headaches post op-did advise to hydra well preop  . Hair loss    right-sided  . History of exercise stress test    a. ETT (12/15) with 12:00 exercise, no ST changes, occasional PACs.   . Hyperlipidemia   . Insomnia   . Migraine headache   . Movement disorder    resltess in left legs  . Postmenopausal   . 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 Premature atrial contractions    a. Holter (12/15) with 8% PACs, no atrial runs or atrial fibrillation.     Past Surgical History:  Procedure Laterality Date  . BREAST BIOPSY  01/01/2011   Procedure: BREAST BIOPSY WITH NEEDLE LOCALIZATION;  Surgeon: Rolm Bookbinder, MD;  Location: [facility];  Service: General;  Laterality: Left;  left breast wire localization biopsy  . BREAST BIOPSY Right 07/27/2012  . BREAST BIOPSY Right 07/07/2016  . BREAST BIOPSY Left 06/30/2016  . cataracts right eye    . CESAREAN SECTION  1986  . HERNIA REPAIR  2000   RIH  . opirectinal membrane peel    . PEG PLACEMENT    . RHINOPLASTY  1983  . TRACHEOSTOMY      There were no vitals filed for this visit.      Subjective Assessment - 08/23/16 1316    Subjective Pt asleep in chair, rouses to cues briefly before falling asleep again.   Patient is  accompained by: Family member   Special Tests Judy Spears, caregiver Judy Spears   Currently in Pain? Yes   Pain Score 8    Pain Location Leg   Pain Orientation Right;Upper   Pain Descriptors / Indicators Aching   Pain Type Acute pain   Pain Onset In the past 7 days   Pain Frequency Intermittent   Aggravating Factors  standing   Pain Relieving Factors sitting, icy hot               ADULT SLP TREATMENT - 08/23/16 1926      General Information   Behavior/Cognition Lethargic;Requires cueing   Patient Positioning Upright in chair   Oral care provided N/A     Treatment Provided   Treatment provided Cognitive-Linquistic     Cognitive-Linquistic Treatment   Treatment focused on Voice;Dysarthria   Skilled Treatment Today's session focused largely on caregiver education as pt limited by fatigue/lethargy today. Pt awoke for periods of 30 seconds-2 minutes with frequent cues. Initially attempted IMRT however pt unable to sustain sufficient attention/arousal. Provided education to Judy Spears and caregiver Judy Spears re: intensity of IMRT as well as adjustments based on pt's performance. Discussed plan of care and plan for therapy hold after Thursday's session, follow-up TBD after  pt's visit with pulmonology next month.     Tracheostomy Shiley 4 mm Uncuffed   Tracheostomy Properties Placement Date: 05/08/16 Placement Time: 1030 Placed By: ICU physician Brand: Shiley Size (mm): 4 mm Style: Uncuffed   Status Capped   Site Assessment Clean;Dry   Ties Assessment Clean;Dry;Secure     Assessment / Recommendations / Plan   Plan Continue with current plan of care     Progression Toward Goals   Progression toward goals --  Pt, family motivated for improvement     General Information   Reason PO's not observed Lethargic;Refused          SLP Education - 08/23/16 1934    Education provided Yes   Education Details lower intensity of IMRT to improve technique, increase intensity as  appropriate. avoid PO when pt lethargic   Person(s) Educated Caregiver(s);Spouse   Methods Explanation   Comprehension Verbalized understanding          SLP Short Term Goals - 08/23/16 1940      SLP SHORT TERM GOAL #1   Title Patient will utilize intelligibility strategies with moderate visual cues from communication partner/visual aid to communicate 5-7 word wants, needs, thoughts and ideas.   Status Achieved     SLP SHORT TERM GOAL #2   Title Patient will utilize intelligibility strategies, breath support with mod A for phrase repetition in 75% of trials.   Status Achieved     SLP SHORT TERM GOAL #3   Title Pt will swallow thin liquids, purees and soft solids in 9/10 trials with no overt signs of aspiration.   Status Achieved     SLP SHORT TERM GOAL #4   Title Patient will tolerate 6oz water, >95% with no overt signs of aspiration.   Status Achieved     SLP SHORT TERM GOAL #5   Title Patient will utilize verbal cues for lip closure, dry swallow for secretion management in 75% of opportunities.   Status Achieved          SLP Long Term Goals - 08/23/16 1941      SLP LONG TERM GOAL #1   Title Patient will utilize multimodal communication/AAC to clarify wants, needs, thoughts or ideas during communication breakdowns with max A   Status Deferred     SLP LONG TERM GOAL #2   Title Patient will utilize intelligibility strategies, breath support with min A verbal cues for 5-7 word responses with 80% accuracy.   Time 1   Period Weeks   Status On-going     SLP LONG TERM GOAL #3   Title Patient will demonstrate readiness for MBS to determine safety for pleasure feeds by timely initiation of swallow in 3/5 trials with ice chips/H20.   Status Achieved     SLP LONG TERM GOAL #4   Title Pt will consume pleasure feeds of desired consistency (soft solids and thin liquids) with signs of aspiration in fewer than 10% of trials with caregiver providing moderate cues and monitoring for  overt signs of aspiration.   Status Deferred     SLP LONG TERM GOAL #5   Title Patient will utilize visual cue for lip closure, dry swallow for secretion management in 75% of opportunities.   Status Achieved     SLP LONG TERM GOAL #6   Title Pt will demo HEP for RMT, dysphagia with min caregiver assistance.   Time 1   Period Weeks   Status On-going  Plan - 08/23/16 1936    Clinical Impression Statement SLP attempted training in IMRT today, however pt limited due to lethargy. Session focus was caregiver education re: HEP and plan of care. Pt will benefit from continued skilled ST to address underlying physical impairments impacting swallowing and speech, as well as education and training in monitoring for and reducing risks of aspiration. Pt/family/caregiver to attend one additional session, to be followed by a hold in therapy pending f/u with pulmonology as pt's voicing, breath support appear limited by loss of air pressure around tracheal stoma.   Speech Therapy Frequency 2x / week   Duration 4 weeks   Treatment/Interventions Aspiration precaution training;Trials of upgraded texture/liquids;Compensatory strategies;Functional tasks;Patient/family education;Cueing hierarchy;Diet toleration management by SLP;Cognitive reorganization;Compensatory techniques;SLP instruction and feedback;Internal/external aids;Multimodal communcation approach;Pharyngeal strengthening exercises   Potential to Achieve Goals Fair   Potential Considerations Ability to learn/carryover information;Co-morbidities;Severity of impairments   SLP Home Exercise Plan Pleasure feeds purees, thin liquids, strict oral care before and after PO, monitor for signs of fatigue, aspiration. RMT   Consulted and Agree with Plan of Care Patient;Family member/caregiver   Family Member Consulted caregiver Judy Spears, Judy Spears      Patient will benefit from skilled therapeutic intervention in order to improve the following  deficits and impairments:   Dysarthria and anarthria  Dysphagia, oropharyngeal phase  Cognitive communication deficit    Problem List Patient Active Problem List   Diagnosis Date Noted  . Malignant neoplasm of upper-inner quadrant of left breast in female, estrogen receptor positive ([place]) 07/14/2016  . Heterotopic ossification of bone 03/10/2016  . Acute on chronic respiratory failure with hypoxia ([facility])   . Acute hypoxemic respiratory failure ([facility])   . Acute blood loss anemia   . Atelectasis 02/04/2016  . Debility 02/03/2016  . Pneumonia of both lower lobes due to Pseudomonas species ([facility])   . Pneumonia of both lower lobes due to methicillin susceptible Staphylococcus aureus (MSSA) ([place])   . Acute respiratory failure with hypoxia ([facility])   . Acute encephalopathy   . Seizures ([place])   . Sepsis ([facility])   . Hypoxia   . Pain   . HCAP (healthcare-associated pneumonia) 12/28/2015  . Chronic respiratory failure ([facility]) 12/28/2015  . Acute tracheobronchitis 12/24/2015  . Tracheostomy tube present ([facility])   . Tracheal stenosis   . Chronic respiratory failure with hypoxia ([facility])   . Spastic tetraplegia ([place] [facility]) 10/20/2015  . Tracheostomy status ([facility]) 09/26/2015  . Allergic rhinitis 09/26/2015  . 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 Movement disorder 09/22/2015  . Encephalitis 09/22/2015  . Chest pain 02/07/2014  . Premature atrial contractions 01/13/2014  . Abnormality of gait 12/04/2013  . Hyperlipidemia 12/21/2011  . Cardiovascular risk factor 12/21/2011  . Bunion 01/31/2008  . Metatarsalgia of both feet 09/20/2007  . FLAT FOOT 09/20/2007  . HALLUX RIGIDUS, ACQUIRED 09/20/2007   Deneise Lever, Kimberly, [facility] Speech-Language Pathologist   Aliene Altes 08/23/2016, 7:42 PM  [facility] [address] [place], [place], [address] Phone: [phone]   Fax:  [phone]   Name: Judy Spears MRN: [id number] Date  of Birth: 1950-04-13

## 2016-08-23 NOTE — Therapy (Signed)
[facility] [address] [place], [place], [address] Phone: [phone]   Fax:  [phone]  Occupational Therapy Treatment  Patient Details  Name: Judy Spears MRN: [id number] Date of Birth: 1950-02-01 Referring Provider: Dr. Quita Skye. [name]  Encounter Date: 08/23/2016      OT End of Session - 08/23/16 1643    Visit Number 45   Number of Visits 72   Date for OT Re-Evaluation 09/07/16   Authorization Type [employer] unlimited visits   OT Start Time 1275   OT Stop Time 1530   OT Time Calculation (min) 43 min   Activity Tolerance Patient tolerated treatment well      Past Medical History:  Diagnosis Date  . Arthritis    lt great toe  . Complication of anesthesia    pt has had headaches post op-did advise to hydra well preop  . Hair loss    right-sided  . History of exercise stress test    a. ETT (12/15) with 12:00 exercise, no ST changes, occasional PACs.   . Hyperlipidemia   . Insomnia   . Migraine headache   . Movement disorder    resltess in left legs  . Postmenopausal   . 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 Premature atrial contractions    a. Holter (12/15) with 8% PACs, no atrial runs or atrial fibrillation.     Past Surgical History:  Procedure Laterality Date  . BREAST BIOPSY  01/01/2011   Procedure: BREAST BIOPSY WITH NEEDLE LOCALIZATION;  Surgeon: Rolm Bookbinder, MD;  Location: [facility];  Service: General;  Laterality: Left;  left breast wire localization biopsy  . BREAST BIOPSY Right 07/27/2012  . BREAST BIOPSY Right 07/07/2016  . BREAST BIOPSY Left 06/30/2016  . cataracts right eye    . CESAREAN SECTION  1986  . HERNIA REPAIR  2000   RIH  . opirectinal membrane peel    . PEG PLACEMENT    . RHINOPLASTY  1983  . TRACHEOSTOMY      There were no vitals filed for this visit.      Subjective Assessment - 08/23/16 1458    Subjective  My leg hurts   Patient is accompained by:  Family member  husband [name] and caregiver Leesa   Pertinent History see epic snapshot - ABI/encephalitis/hypoxia from virus; recent hospitalization due to ARF;    Patient Stated Goals Pt unable to state.    Currently in Pain? Yes   Pain Score 8    Pain Location Leg   Pain Orientation Right;Upper   Pain Descriptors / Indicators Aching   Pain Type Acute pain   Pain Onset In the past 7 days   Pain Frequency Intermittent   Aggravating Factors  standing, WB   Pain Relieving Factors sitting, icy hot                      OT Treatments/Exercises (OP) - 08/23/16 0001      Neurological Re-education Exercises   Other Exercises 1 Neuro re ed first in supine (simultaneously with moist heat on R thigh for pain) to address RUE functional unilateral reach. Pt very drowsy today and intiitally required max cues to remain awake however this improved as session progressed.  Pt with improved abiltiy for reach in supine and able to complete functional reach task with RUE with min assist and cues for shoulder hiking. Transitione into sitting and then able to address sit to stand, static and  dynamic standing balance and functional ambulation with RW.  Pt's husband reports that a medication for pt's breast cancer is causing RLE pain - when pt pain occurs pt is unable to bear weight on RLE. Heat appears to temporarily relieve pain therefore able to address mobility after use of heat. Recommended to family that they try heat several times per day with more frequent but brief standing activities and short walks with the walker. Pt and caregiver both verbalized understanding.       Modalities   Modalities Moist Heat     Moist Heat Therapy   Number Minutes Moist Heat 10 Minutes   Moist Heat Location --  R thigh to address pain                   OT Short Term Goals - 08/23/16 1639      OT SHORT TERM GOAL  #9   TITLE Pt and family will be mod I with upgraded home activities program -  08/09/2016   Status Achieved     OT SHORT TERM GOAL  #10   TITLE Pt will be min a stand step toilet transfers at least 50% of the time using either grab bar or walker for community transfers   Status Achieved     OT SHORT TERM GOAL  #11   TITLE Pt will consistently move from sit to stand with no more than min a for functional mobility   Status Achieved     OT SHORT TERM GOAL  #12   TITLE Pt will be no more than mod a consistently for bathing at shower level   Status On-going           OT Long Term Goals - 08/23/16 1640      OT LONG TERM GOAL #1   Title Pt, husband and caregiver will be mod I with upgraded HEP prn - 04/26/2016   Status Achieved     OT LONG TERM GOAL #2   Title Pt, husband and caregiver will be mod I with splint wear and care    Status Achieved     OT LONG TERM GOAL #3   Title Pt will be able to wash face with min a only   Status Achieved     OT LONG TERM GOAL #4   Title Pt will be able to sit without UE support with supevision 100% of the time with no more than 2 vc's while engaged in functional activity.   Status Achieved     OT LONG TERM GOAL #5   Title Pt, husband and caregiver will verbalize understanding of potential after care therapeutic opportunities. - 07/08/2016 date adjusted as pt missed 2 weeks due to hospitalization   Status Achieved     OT LONG TERM GOAL #6   Title Pt will be supervision to wash face consistently   Status Achieved     OT LONG TERM GOAL #7   Title Pt will don shirt with min a and max vc's 50% of the time   Status Achieved     OT LONG TERM GOAL #8   Title Pt will require min a for stand step transfers to toilet using grab bar or walker for community toileting at least 75% of the time. - 09/06/2016   Status On-going     OT LONG TERM GOAL  #9   Baseline Pt will stand with contact gurard with only 1 UE support for static standing balance in prep for  abiilty to engage in functional task while standing.    Status Achieved      OT LONG TERM GOAL  #10   TITLE Pt will consistenly move from sit to stand with no more than CGA while caregiver assists with pulling up pants.    Status On-going     OT LONG TERM GOAL  #11   TITLE Pt will require no more than close supervision for static standing with bilateral UE while caregiver assists with hiking pants.    Status On-going     OT LONG TERM GOAL  #12   TITLE Pt will go from stand to sit during ADL activities with no more than min a.    Status On-going               Plan - 08/23/16 1640    Clinical Impression Statement Pt currently with intermittent pain in RLE (upper thigh area) due to hormone suppression med started after recent diagnosis for breast cancer. This is causing a temporary set back from a mobility standpoint as pain increases with weightbearing.  Husband states they are monitoring this with hope that pt will adjust to meds. Husband states if it doesn't they may need to consider a different medication.    Rehab Potential Fair   Current Impairments/barriers affecting progress: severity of deficits, very slow rate of recovery; pt recently diagnosed with breast cancer - will monitor ability to tolerate therapy   OT Frequency 2x / week   OT Duration 8 weeks   OT Treatment/Interventions Self-care/ADL training;Ultrasound;Moist Heat;Electrical Stimulation;DME and/or AE instruction;Neuromuscular education;Therapeutic exercise;Functional Mobility Training;Manual Therapy;Passive range of motion;Splinting;Therapeutic activities;Balance training;Patient/family education;Visual/perceptual remediation/compensation;Cognitive remediation/compensation   Plan NMR for trunk. UE's, hips, functional mobility, stand to sit, sit to stand, static and dynamic standing balance wit decreased reliance on UE's, postural orientation and reactions.    Consulted and Agree with Plan of Care Patient;Family member/caregiver   Family Member Consulted caregiver Leesa, hsuband [name]       Patient will benefit from skilled therapeutic intervention in order to improve the following deficits and impairments:  Decreased endurance, Decreased skin integrity, Impaired vision/preception, Improper body mechanics, Impaired perceived functional ability, Decreased knowledge of precautions, Cardiopulmonary status limiting activity, Decreased activity tolerance, Decreased knowledge of use of DME, Decreased strength, Impaired flexibility, Improper spinal/pelvic alignment, Impaired sensation, Difficulty walking, Decreased mobility, Decreased balance, Decreased cognition, Decreased range of motion, Impaired tone, Impaired UE functional use, Decreased safety awareness, Decreased coordination, Pain  Visit Diagnosis: Unsteadiness on feet  Muscle weakness (generalized)  Other symptoms and signs involving the nervous system  Abnormal posture  Stiffness of left shoulder, not elsewhere classified  Stiffness of right shoulder, not elsewhere classified  Stiffness of left elbow, not elsewhere classified  Other lack of coordination  Frontal lobe and executive function deficit    Problem List Patient Active Problem List   Diagnosis Date Noted  . Malignant neoplasm of upper-inner quadrant of left breast in female, estrogen receptor positive ([facility]) 07/14/2016  . Heterotopic ossification of bone 03/10/2016  . Acute on chronic respiratory failure with hypoxia ([facility])   . Acute hypoxemic respiratory failure ([facility])   . Acute blood loss anemia   . Atelectasis 02/04/2016  . Debility 02/03/2016  . Pneumonia of both lower lobes due to Pseudomonas species ([facility])   . Pneumonia of both lower lobes due to methicillin susceptible Staphylococcus aureus (MSSA) ([facility])   . Acute respiratory failure with hypoxia ([facility])   . Acute encephalopathy   . Seizures ([facility])   .  Sepsis ([facility])   . Hypoxia   . Pain   . HCAP (healthcare-associated pneumonia) 12/28/2015  . Chronic respiratory failure ([facility]) 12/28/2015  .  Acute tracheobronchitis 12/24/2015  . Tracheostomy tube present ([facility])   . Tracheal stenosis   . Chronic respiratory failure with hypoxia ([facility])   . Spastic tetraplegia ([facility]) 10/20/2015  . Tracheostomy status ([facility]) 09/26/2015  . Allergic rhinitis 09/26/2015  . 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 Movement disorder 09/22/2015  . Encephalitis 09/22/2015  . Chest pain 02/07/2014  . Premature atrial contractions 01/13/2014  . Abnormality of gait 12/04/2013  . Hyperlipidemia 12/21/2011  . Cardiovascular risk factor 12/21/2011  . Bunion 01/31/2008  . Metatarsalgia of both feet 09/20/2007  . FLAT FOOT 09/20/2007  . HALLUX RIGIDUS, ACQUIRED 09/20/2007    Quay Burow, OTR/L 08/23/2016, 4:45 PM  [facility] [address] [place], [place], [address] Phone: [phone]   Fax:  [phone]  Name: TYMBER STALLINGS MRN: [id number] Date of Birth: 1950-12-26

## 2016-08-24 NOTE — Therapy (Signed)
[facility] [address] [place], [place], [address] Phone: [phone]   Fax:  [phone]  Physical Therapy Treatment  Patient Details  Name: Judy Spears MRN: [id number] Date of Birth: 1950/05/28 Referring Provider: Alger Simons, MD/Andrew Letta Pate, MD  Encounter Date: 08/23/2016      PT End of Session - 08/24/16 1159    Visit Number 61   Number of Visits 32   Date for PT Re-Evaluation 08/23/16   Authorization Type BC[employer]   PT Start Time 1401   PT Stop Time 0802   PT Time Calculation (min) 46 min      Past Medical History:  Diagnosis Date  . Arthritis    lt great toe  . Complication of anesthesia    pt has had headaches post op-did advise to hydra well preop  . Hair loss    right-sided  . History of exercise stress test    a. ETT (12/15) with 12:00 exercise, no ST changes, occasional PACs.   . Hyperlipidemia   . Insomnia   . Migraine headache   . Movement disorder    resltess in left legs  . Postmenopausal   . 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 Premature atrial contractions    a. Holter (12/15) with 8% PACs, no atrial runs or atrial fibrillation.     Past Surgical History:  Procedure Laterality Date  . BREAST BIOPSY  01/01/2011   Procedure: BREAST BIOPSY WITH NEEDLE LOCALIZATION;  Surgeon: Rolm Bookbinder, MD;  Location: [facility];  Service: General;  Laterality: Left;  left breast wire localization biopsy  . BREAST BIOPSY Right 07/27/2012  . BREAST BIOPSY Right 07/07/2016  . BREAST BIOPSY Left 06/30/2016  . cataracts right eye    . CESAREAN SECTION  1986  . HERNIA REPAIR  2000   RIH  . opirectinal membrane peel    . PEG PLACEMENT    . RHINOPLASTY  1983  . TRACHEOSTOMY      There were no vitals filed for this visit.      Subjective Assessment - 08/24/16 1144    Subjective [name], caregiver, and husband [name] report pt has had severe leg pain in RLE (quad and inner thigh) since  last Tuesday; went to [facility] for follow up with cancer MD and they stated that pain is a side effect of the cancer medication - states that it may come and go or come and stay   Pt very tired and sleepy today due to road trip to [facility] over weekend for a wedding   Patient is accompained by: Family member   Pertinent History Precautions:  PEG.  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  Another round of botox in LLE on 02/06/16   Patient Stated Goals Per husband, "For [name] to be as independent as possible."   Currently in Pain? Yes   Pain Score 8    Pain Location Leg   Pain Orientation Right;Left   Pain Descriptors / Indicators Aching   Pain Type Acute pain   Pain Onset In the past 7 days   Pain Frequency Intermittent   Aggravating Factors  standing, WB'g   Pain Relieving Factors sitting, icy hot  [facility] PT Treatment/Exercise - 08/24/16 0001      Transfers   Transfers Sit to Stand;Stand to Sit;Squat Pivot Transfers   Sit to Stand 3: Mod assist   Sit to Stand Details Manual facilitation for weight shifting;Verbal cues for sequencing;Tactile cues for sequencing;Tactile cues for weight shifting   Stand to Sit 3: Mod assist   Stand to Sit Details (indicate cue type and reason) Tactile cues for initiation;Tactile cues for sequencing;Tactile cues for weight shifting;Tactile cues for posture   Stand to Sit Details cues to flex trunk and hips   Squat Pivot Transfers 3: Mod assist   Comments RLE pain significantly limiting ability to wieght bear on RLE and decreasing mobility     Ambulation/Gait   Ambulation/Gait Yes   Ambulation/Gait Assistance 2: Max assist   Ambulation/Gait Assistance Details used RW; pt needed mod assist for weight shifting and needed  manual assist on Rt knee to provenet buckling due to increased pain; pt keeping knee flexed in stance due to pain with weight bearing   Ambulation Distance (Feet) 22 Feet   Assistive device Rolling walker   Gait Pattern Decreased stance time - right;Decreased hip/knee flexion - right;Decreased hip/knee flexion - left;Decreased weight shift to right;Scissoring;Ataxic   Ambulation Surface Level;Indoor     Knee/Hip Exercises: Stretches   Passive Hamstring Stretch Right;2 reps;20 seconds     Knee/Hip Exercises: Seated   Long Arc Quad AROM;Right;1 set;10 reps      Above gait training (22') performed with assistance of pt's husband, [name], in front of RW, [name], caregiver, on pt's left side and myself, PT, Positioned on Rt side to manually block Rt knee in stance to prevent buckling            PT Short Term Goals - 07/22/16 1322      PT SHORT TERM GOAL #1   Title Jay Back on pt's current manual wheelchair to be switched out to regular back; pt will demonstrate abilty to sit in wheelchair with upright with less supportive wheelchair back.  07-24-16   Baseline Jay Back on current wheelchair   Time 4   Period Weeks   Status Achieved     PT SHORT TERM GOAL #2   Title Pt will perform sit to stand from wheelchair to RW with CGA.  07-24-16   Baseline min/guard to min A 07/22/16   Time 4   Period Weeks   Status Partially Met     PT SHORT TERM GOAL #3   Title Family to obtain bil. platform RW to begin assisting pt with ambulation in her home environment.  07-24-16   Baseline met with B PFRW 07/22/16   Time 4   Period Weeks   Status Achieved     PT SHORT TERM GOAL #4   Title Family will report pt rolling from supine to Rt and Lt sides in hospital bed at home with supervision without use of bed rail with cues for positioning of LE's.  07-24-16   Baseline pt needs min assist at times - activity just demonstrated by pt on 06-24-16   Time 4   Period Weeks   Status New     PT SHORT TERM GOAL  #5   Title Basic transfers from wheelchair to bed or commode with CGA.  07-24-16   Baseline min A 07/22/16 with RW   Time 4   Period Weeks   Status Partially Met     PT SHORT TERM GOAL #6   Title  Amb. 115' with bil. platform RW with CGA to begin amb. at home with assistance safely.  07-24-16   Baseline 115' with BPFRW and now RW with min A 07/22/16   Time 4   Period Weeks   Status Partially Met           PT Long Term Goals - 06/25/16 1142      PT LONG TERM GOAL #1   Title Pt will perform supine rolling R and L at mod I level in order to indicate improved independence with bed mobility.  (Target Date: 06/25/16)/ 08-23-16  CONT. LTG til 08-23-16   Baseline inconsistent in performance - pt needs cues for LE flexion to assist with pushing for rolling and cues for UE positioning   Time 8   Period Weeks   Status On-going     PT LONG TERM GOAL #2   Title Pt will perform sit>SL R or L modified independently in order to indicate improved independence with getting into bed.   08-23-16   Baseline min to mod assist needed - 06-24-16   Time 8   Status New     PT LONG TERM GOAL #3   Title Pt will transfer Lt/Rt sidelying to sitting with CGA for incr. independence with getting out of bed.  08-23-16   Baseline min to mod assist needed -06-24-16   Time 8   Period Weeks   Status New     PT LONG TERM GOAL #4   Title Pt will perform basic transfers with CGA to indicate improved functional independence.  08-23-16   Baseline min assist needed - 06-24-16   Time 8   Period Weeks   Status New     PT LONG TERM GOAL #5   Title Pt will perform static standing with bil. UE support with distant supervision for >/= 10" for improved standing balance.  08-23-16   Baseline close SBA to CGA needed - pt standing with bil UE support for 8-10" per caregiver's report -- 06-24-16   Time 8   Period Weeks   Status New     Additional Long Term Goals   Additional Long Term Goals Yes     PT LONG TERM GOAL #6   Title  Pt will demonstrate ability to ambulate up to 25' w/ LRAD at min A level in order to assist with getting around home.  CONT LTG til 08-23-16; goal upgraded to 50'   Baseline bil. platform RW to be used in home   Time 8   Period Weeks   Status On-going     PT LONG TERM GOAL #7   Title Perform sit to stand and stand to sit to RW or to another object with CGA.  08-23-16   Baseline min assist needed - 06-24-16   Time 8   Period Weeks   Status New               Plan - 08/24/16 1201    Clinical Impression Statement Pain in RLE is significantly impactiing and limiting mobility as pt is hesitant to weight bear on RLE due to pain in rt quads and adductors.  Tightness in Rt hip adductors palpated.  Rt knee buckling due to pain with RLE weight bearing.  RLE pain is also affecting sit to stand and stand pivot transfers; caregiver reports pt has been unable to amb. at home with asisstance since last Tues,     Rehab Potential Fair   Clinical Impairments Affecting Rehab  Potential Chronicity of condition, continued medical management, but has very strong family and financial support   PT Frequency 2x / week   PT Duration 8 weeks   PT Treatment/Interventions ADLs/Self Care Home Management;DME Instruction;Gait training;Stair training;Functional mobility training;Therapeutic activities;Patient/family education;Cognitive remediation;Neuromuscular re-education;Therapeutic exercise;Balance training;Orthotic Fit/Training;Wheelchair mobility training;Manual techniques;Splinting;Visual/perceptual remediation/compensation;Vestibular;Passive range of motion;Electrical Stimulation   PT Next Visit Plan Check LTG's - renew for 8 weeks   Consulted and Agree with Plan of Care Patient;Family member/caregiver   Family Member Consulted caregiver, [name] and husband [name]      Patient will benefit from skilled therapeutic intervention in order to improve the following deficits and impairments:  Abnormal gait, Decreased  balance, Decreased endurance, Decreased cognition, Cardiopulmonary status limiting activity, Decreased activity tolerance, Decreased coordination, Decreased strength, Impaired flexibility, Impaired tone, Decreased mobility, Increased muscle spasms, Impaired vision/preception, Decreased range of motion, Impaired UE functional use, Postural dysfunction, Improper body mechanics, Impaired perceived functional ability, Decreased knowledge of precautions, Decreased knowledge of use of DME  Visit Diagnosis: Other abnormalities of gait and mobility  Other symptoms and signs involving the nervous system     Problem List Patient Active Problem List   Diagnosis Date Noted  . Malignant neoplasm of upper-inner quadrant of left breast in female, estrogen receptor positive ([place]) 07/14/2016  . Heterotopic ossification of bone 03/10/2016  . Acute on chronic respiratory failure with hypoxia ([place])   . Acute hypoxemic respiratory failure ([place])   . Acute blood loss anemia   . Atelectasis 02/04/2016  . Debility 02/03/2016  . Pneumonia of both lower lobes due to Pseudomonas species ([place])   . Pneumonia of both lower lobes due to methicillin susceptible Staphylococcus aureus (MSSA) ([place])   . Acute respiratory failure with hypoxia ([place])   . Acute encephalopathy   . Seizures ([place])   . Sepsis ([place])   . Hypoxia   . Pain   . HCAP (healthcare-associated pneumonia) 12/28/2015  . Chronic respiratory failure ([place]) 12/28/2015  . Acute tracheobronchitis 12/24/2015  . Tracheostomy tube present ([place])   . Tracheal stenosis   . Chronic respiratory failure with hypoxia ([place])   . 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 Tracheostomy status ([place]) 09/26/2015  . Allergic rhinitis 09/26/2015  . 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 Movement disorder 09/22/2015  . Encephalitis 09/22/2015  . Chest pain 02/07/2014  . Premature atrial contractions 01/13/2014  . Abnormality of gait 12/04/2013  . Hyperlipidemia  12/21/2011  . Cardiovascular risk factor 12/21/2011  . Bunion 01/31/2008  . Metatarsalgia of both feet 09/20/2007  . FLAT FOOT 09/20/2007  . HALLUX RIGIDUS, ACQUIRED 09/20/2007    Alda Lea, PT 08/24/2016, 12:07 PM  [facility] [address] [place], [place], [address] Phone: [phone]   Fax:  [phone]  Name: CORTLYNN HOLLINSWORTH MRN: [id number] Date of Birth: March 25, 1950

## 2016-08-26 ENCOUNTER — Ambulatory Visit: Payer: Medicare Other | Admitting: Occupational Therapy

## 2016-08-26 ENCOUNTER — Ambulatory Visit: Payer: Medicare Other | Admitting: Speech Pathology

## 2016-08-26 ENCOUNTER — Ambulatory Visit: Payer: Medicare Other | Attending: Physical Medicine & Rehabilitation | Admitting: Physical Therapy

## 2016-08-26 ENCOUNTER — Encounter: Payer: Self-pay | Admitting: Occupational Therapy

## 2016-08-26 DIAGNOSIS — M25612 Stiffness of left shoulder, not elsewhere classified: Secondary | ICD-10-CM

## 2016-08-26 DIAGNOSIS — M6281 Muscle weakness (generalized): Secondary | ICD-10-CM

## 2016-08-26 DIAGNOSIS — R29818 Other symptoms and signs involving the nervous system: Secondary | ICD-10-CM | POA: Diagnosis present

## 2016-08-26 DIAGNOSIS — R293 Abnormal posture: Secondary | ICD-10-CM | POA: Diagnosis present

## 2016-08-26 DIAGNOSIS — R41841 Cognitive communication deficit: Secondary | ICD-10-CM | POA: Insufficient documentation

## 2016-08-26 DIAGNOSIS — R471 Dysarthria and anarthria: Secondary | ICD-10-CM | POA: Insufficient documentation

## 2016-08-26 DIAGNOSIS — R1312 Dysphagia, oropharyngeal phase: Secondary | ICD-10-CM

## 2016-08-26 DIAGNOSIS — R278 Other lack of coordination: Secondary | ICD-10-CM

## 2016-08-26 DIAGNOSIS — R2681 Unsteadiness on feet: Secondary | ICD-10-CM

## 2016-08-26 DIAGNOSIS — M25622 Stiffness of left elbow, not elsewhere classified: Secondary | ICD-10-CM | POA: Diagnosis present

## 2016-08-26 DIAGNOSIS — R2689 Other abnormalities of gait and mobility: Secondary | ICD-10-CM

## 2016-08-26 DIAGNOSIS — M79604 Pain in right leg: Secondary | ICD-10-CM | POA: Diagnosis present

## 2016-08-26 DIAGNOSIS — R41844 Frontal lobe and executive function deficit: Secondary | ICD-10-CM | POA: Diagnosis present

## 2016-08-26 DIAGNOSIS — M25611 Stiffness of right shoulder, not elsewhere classified: Secondary | ICD-10-CM | POA: Insufficient documentation

## 2016-08-26 NOTE — Therapy (Signed)
+[facility] [address] [place], [place], [address] Phone: [phone]   Fax:  [phone]  Physical Therapy Treatment  Patient Details  Name: Judy Spears MRN: [id number] Date of Birth: 03-04-1950 Referring Provider: Alger Simons, MD/Andrew Letta Pate, MD  Encounter Date: 08/26/2016      PT End of Session - 08/26/16 1934    Visit Number 47   Number of Visits 12   Date for PT Re-Evaluation 10/25/16   Authorization Type BCBS   PT Start Time 1318   PT Stop Time 1401   PT Time Calculation (min) 43 min      Past Medical History:  Diagnosis Date  . Arthritis    lt great toe  . Complication of anesthesia    pt has had headaches post op-did advise to hydra well preop  . Hair loss    right-sided  . History of exercise stress test    a. ETT (12/15) with 12:00 exercise, no ST changes, occasional PACs.   . Hyperlipidemia   . Insomnia   . Migraine headache   . Movement disorder    resltess in left legs  . Postmenopausal   . 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 Premature atrial contractions    a. Holter (12/15) with 8% PACs, no atrial runs or atrial fibrillation.     Past Surgical History:  Procedure Laterality Date  . BREAST BIOPSY  01/01/2011   Procedure: BREAST BIOPSY WITH NEEDLE LOCALIZATION;  Surgeon: Rolm Bookbinder, MD;  Location: [facility];  Service: General;  Laterality: Left;  left breast wire localization biopsy  . BREAST BIOPSY Right 07/27/2012  . BREAST BIOPSY Right 07/07/2016  . BREAST BIOPSY Left 06/30/2016  . cataracts right eye    . CESAREAN SECTION  1986  . HERNIA REPAIR  2000   RIH  . opirectinal membrane peel    . PEG PLACEMENT    . RHINOPLASTY  1983  . TRACHEOSTOMY      There were no vitals filed for this visit.      Subjective Assessment - 08/26/16 1859    Subjective [name], caregiver, reports pt is not putting weight on RLE during transfers due to pain due to hormone  suppression medication side effect; she states that pt's husband, [name], says he is not giving pt this medication tonight due to severity of this side effect (pain) which is affecting mobility   Patient is accompained by: Family member   Pertinent History Precautions:  PEG.  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  Another round of botox in LLE on 02/06/16   Patient Stated Goals Per husband, "For [place] to be as independent as possible."   Currently in Pain? Yes   Pain Score 4    Pain Location Leg   Pain Orientation Right   Pain Descriptors / Indicators Aching;Grimacing;Guarding   Pain Type Acute pain   Pain Onset 1 to 4 weeks ago   Pain Frequency Intermittent                         [facility] Adult PT Treatment/Exercise - 08/26/16 1329      Bed Mobility   Supine to Sit 3: Mod assist   Sitting - Scoot to Edge of Bed 3: Mod assist   Sit to Supine 3:  Mod assist     Transfers   Transfers Sit to Stand   Sit to Stand 3: Mod assist   Sit to Stand Details Manual facilitation for weight shifting;Verbal cues for sequencing;Tactile cues for sequencing;Tactile cues for weight shifting   Stand to Sit 3: Mod assist   Stand to Sit Details (indicate cue type and reason) Tactile cues for initiation;Tactile cues for sequencing;Tactile cues for weight shifting;Tactile cues for posture   Stand to Sit Details cues to flex hips and trunk   Squat Pivot Transfers 2: Max assist   Comments RLE pain significantly limiting ability to wieght bear on RLE and decreasing mobility     Ambulation/Gait   Ambulation/Gait Yes   Ambulation/Gait Assistance 2: Max assist  +2 assist ([name], caregiver assisting)   Ambulation/Gait Assistance Details used RW; manual assist for weight shifting and occasional assist for Rt foot  pacement due to incr. adduction   Ambulation Distance (Feet) 13 Feet   Assistive device Rolling walker   Gait Pattern Decreased stance time - right;Decreased hip/knee flexion - right;Decreased hip/knee flexion - left;Decreased weight shift to right;Scissoring;Ataxic   Ambulation Surface Level;Indoor     Moist heat pack placed on pt's Rt thigh (over quadriceps area) with pt lying supine - blue bolster placed under bil. Knees - x 10" for Pain relief in RLE  RLE hip/knee flexion and extension x 10 reps  Pt performed sit to stand transfers from hi/lo mat table - pt's hands on PT's thighs to facilitate forward leaning: then hands moved up to PT's shoulders to facilitate upright standing above transfers performed 5 reps          PT Education - 08/26/16 1933    Education provided Yes   Education Details recommended use of Bio[employer]ze rather than [employer] - instructed caregiver and husband to check with pharmacist on use of this analgesic to ensure no interactions with any other of her meds   Person(s) Educated Patient;Caregiver(s);Spouse   Methods Explanation   Comprehension Verbalized understanding          PT Short Term Goals - 08/26/16 2011      PT SHORT TERM GOAL #1   Title Perform sit to/from stand from wheelchair or mat with CGA.  09-25-16   Baseline mod assist on 08-26-16   Time 4   Period Weeks   Status New   Target Date 09/25/16     PT SHORT TERM GOAL #2   Title Pt will perform sit to stand from wheelchair to RW with CGA.  07-24-16;  NEW TARGET DATE  09-25-16   Baseline min/guard to min A 07/22/16; min to mod assist on 08-26-16   Time 4   Period Weeks   Status On-going   Target Date 09/25/16     PT SHORT TERM GOAL #3   Title Pt will stand for at least 5" at home with UE support with CGA for RLE and to demo decreased pain in RLE with weight bearing.   09-25-16   Time 4   Period Weeks   Status New   Target Date 09/25/16     PT SHORT TERM GOAL #4   Title Family will report  pt rolling from supine to Rt and Lt sides in hospital bed at home with supervision without use of bed rail with cues for positioning of LE's.  07-24-16/  NEW TARGET DATE 09-25-16   Baseline pt needs min assist at times - activity just demonstrated by pt on 06-24-16  Time 4   Status On-going   Target Date 09/25/16     PT SHORT TERM GOAL #5   Title Basic transfers from wheelchair to bed or commode with +1 min assist.  NEW TARGET DATE 09-25-16   Baseline min A 07/22/16 with RW; mod to max assist on 08-26-16   Time 4   Period Weeks   Status New   Target Date 09/25/16     PT SHORT TERM GOAL #6   Title Amb. 61' with RW with +1 min assist.  09-26-16   Baseline 13" with RW with +2 max asssit - 08-26-16   Time 4   Period Weeks   Status New           PT Long Term Goals - 08/26/16 2021      PT LONG TERM GOAL #1   Title Pt will perform supine rolling R and L at mod I level in order to indicate improved independence with bed mobility.  (Target Date: 06/25/16)/ 08-23-16/10-25-16   Baseline inconsistent in performance - pt needs cues for LE flexion to assist with pushing for rolling and cues for UE positioning   Time 8   Period Weeks   Status On-going   Target Date 10/25/16     PT LONG TERM GOAL #2   Title Pt will perform sit>SL R or L modified independently in order to indicate improved independence with getting into bed.   08-23-16/ 10-25-16   Baseline mod assist on 08-26-16   Time 8   Period Weeks   Status On-going   Target Date 10/25/16     PT LONG TERM GOAL #3   Title Pt will transfer Lt/Rt sidelying to sitting with CGA for incr. independence with getting out of bed.  08-23-16/ 10-25-16   Baseline min to mod assist needed -08-26-16   Time 8   Period Weeks   Status On-going   Target Date 10/25/16     PT LONG TERM GOAL #4   Title Pt will perform basic transfers with min assist to indicate improved functional independence.  08-23-16/ 10-25-16   Baseline mod to max assist - 08-26-16   Time 8   Period  Weeks   Status Revised   Target Date 10/25/16     PT LONG TERM GOAL #5   Title Pt will perform static standing with bil. UE support with distant supervision for >/= 10" for improved standing balance.  08-23-16/ 10-25-16   Baseline mod assist for standing with pt unable to tolerate weight bearing on RLE due to c/o pain - 08-26-16   Time 8   Period Weeks   Status New   Target Date 10/25/16     PT LONG TERM GOAL #6   Title Pt will demonstrate ability to ambulate >/= 50' w/ LRAD at min A level in order to assist with getting around home.  CONT LTG til 08-23-16;         NEW TARGET DATE 10-25-16   Baseline RW used to amb. 13' with +2 max assist on 08-26-16   Time 8   Period Weeks   Status Revised     PT LONG TERM GOAL #7   Title Perform sit to stand and stand to sit to RW or to another object with CGA.  08-23-16/ 10-25-16   Baseline mod assist needed on 08-26-16   Time 8   Period Weeks   Status New  Plan - 08/26/16 1936    Clinical Impression Statement Pt has significantly decreased mobility/decline in functional status due to c/o pain in RLE due to side effect of hormone suppression medication; pt rated pain 4/10 before heat and reported some improvement after moist head removed, however pt continued to have difficulty weight bearing on RLE and advancing RLE in swing with Rt foot clearance as Rt toes were dragging in swing phase of gait                                                                                                    Rehab Potential Fair   Clinical Impairments Affecting Rehab Potential Chronicity of condition, continued medical management, but has very strong family and financial support   PT Frequency 2x / week   PT Duration 8 weeks   PT Treatment/Interventions ADLs/Self Care Home Management;DME Instruction;Gait training;Stair training;Functional mobility training;Therapeutic activities;Patient/family education;Cognitive remediation;Neuromuscular  re-education;Therapeutic exercise;Balance training;Orthotic Fit/Training;Wheelchair mobility training;Manual techniques;Splinting;Visual/perceptual remediation/compensation;Vestibular;Passive range of motion;Electrical Stimulation   PT Next Visit Plan cont to work on transfers, gait training and standing balance   Consulted and Agree with Plan of Care Patient;Family member/caregiver   Family Member Consulted caregiver, [name] and husband [name]      Patient will benefit from skilled therapeutic intervention in order to improve the following deficits and impairments:  Abnormal gait, Decreased balance, Decreased endurance, Decreased cognition, Cardiopulmonary status limiting activity, Decreased activity tolerance, Decreased coordination, Decreased strength, Impaired flexibility, Impaired tone, Decreased mobility, Increased muscle spasms, Impaired vision/preception, Decreased range of motion, Impaired UE functional use, Postural dysfunction, Improper body mechanics, Impaired perceived functional ability, Decreased knowledge of precautions, Decreased knowledge of use of DME  Visit Diagnosis: Other abnormalities of gait and mobility - Plan: PT plan of care cert/re-cert  Unsteadiness on feet - Plan: PT plan of care cert/re-cert  Other symptoms and signs involving the nervous system - Plan: PT plan of care cert/re-cert  Pain in right leg - Plan: PT plan of care cert/re-cert  Muscle weakness (generalized) - Plan: PT plan of care cert/re-cert  Other lack of coordination - Plan: PT plan of care cert/re-cert     Problem List Patient Active Problem List   Diagnosis Date Noted  . Malignant neoplasm of upper-inner quadrant of left breast in female, estrogen receptor positive ([place]) 07/14/2016  . Heterotopic ossification of bone 03/10/2016  . Acute on chronic respiratory failure with hypoxia ([facility])   . Acute hypoxemic respiratory failure ([facility])   . Acute blood loss anemia   . Atelectasis 02/04/2016  .  Debility 02/03/2016  . Pneumonia of both lower lobes due to Pseudomonas species ([facility])   . Pneumonia of both lower lobes due to methicillin susceptible Staphylococcus aureus (MSSA) ([facility])   . Acute respiratory failure with hypoxia ([facility])   . Acute encephalopathy   . Seizures ([place])   . Sepsis ([facility])   . Hypoxia   . Pain   . HCAP (healthcare-associated pneumonia) 12/28/2015  . Chronic respiratory failure ([facility]) 12/28/2015  . Acute tracheobronchitis 12/24/2015  . Tracheostomy tube present ([place])   . Tracheal stenosis   .  Chronic respiratory failure with hypoxia ([place])   . Spastic tetraplegia ([place]) 10/20/2015  . Tracheostomy status ([place]) 09/26/2015  . Allergic rhinitis 09/26/2015  . 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 Movement disorder 09/22/2015  . Encephalitis 09/22/2015  . Chest pain 02/07/2014  . Premature atrial contractions 01/13/2014  . Abnormality of gait 12/04/2013  . Hyperlipidemia 12/21/2011  . Cardiovascular risk factor 12/21/2011  . Bunion 01/31/2008  . Metatarsalgia of both feet 09/20/2007  . FLAT FOOT 09/20/2007  . HALLUX RIGIDUS, ACQUIRED 09/20/2007    Alda Lea, PT 08/26/2016, 8:42 PM  [facility] [address] [place], [place], [address] Phone: [phone]   Fax:  [phone]  Name: NYAH SHEPHERD MRN: [id number] Date of Birth: 24-Aug-1950

## 2016-08-26 NOTE — Therapy (Signed)
[facility] [address] [place], [place], [address] Phone: [phone]   Fax:  [phone]  Occupational Therapy Treatment  Patient Details  Name: Judy Spears MRN: [id number] Date of Birth: 1950-03-12 Referring Provider: Dr. Quita Skye. [name]  Encounter Date: 08/26/2016      OT End of Session - 08/26/16 1720    Visit Number 61   Number of Visits 20   Date for OT Re-Evaluation 09/07/16   Authorization Type BCBS unlimited visits   OT Start Time 1448   OT Stop Time 1530   OT Time Calculation (min) 42 min   Activity Tolerance Patient tolerated treatment well   Behavior During Therapy [facility] for tasks assessed/performed      Past Medical History:  Diagnosis Date  . Arthritis    lt great toe  . Complication of anesthesia    pt has had headaches post op-did advise to hydra well preop  . Hair loss    right-sided  . History of exercise stress test    a. ETT (12/15) with 12:00 exercise, no ST changes, occasional PACs.   . Hyperlipidemia   . Insomnia   . Migraine headache   . Movement disorder    resltess in left legs  . Postmenopausal   . 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 Premature atrial contractions    a. Holter (12/15) with 8% PACs, no atrial runs or atrial fibrillation.     Past Surgical History:  Procedure Laterality Date  . BREAST BIOPSY  01/01/2011   Procedure: BREAST BIOPSY WITH NEEDLE LOCALIZATION;  Surgeon: Rolm Bookbinder, MD;  Location: [facility];  Service: General;  Laterality: Left;  left breast wire localization biopsy  . BREAST BIOPSY Right 07/27/2012  . BREAST BIOPSY Right 07/07/2016  . BREAST BIOPSY Left 06/30/2016  . cataracts right eye    . CESAREAN SECTION  1986  . HERNIA REPAIR  2000   RIH  . opirectinal membrane peel    . PEG PLACEMENT    . RHINOPLASTY  1983  . TRACHEOSTOMY      There were no vitals filed for this visit.      Subjective Assessment - 08/26/16 1710     Subjective  right leg   Patient is accompained by: Family member   Pertinent History see epic snapshot - ABI/encephalitis/hypoxia from virus; recent hospitalization due to ARF;    Patient Stated Goals Pt unable to state.    Currently in Pain? No/denies   Pain Score 0-No pain                      OT Treatments/Exercises (OP) - 08/26/16 0001      Neurological Re-education Exercises   Other Exercises 1 Neuromuscular reeducation to address postural control and lower trunk initiated movement.  Patient had reported pain in PT session earlier, and pain worsens with weight bearing recently, so emphasis on movement without weight bearig on feet today on physioball.  Patient with limited lateral flexion toward left side, although with repetition and cues to laterally flex neck this improved.  Worked on flexion / rotatio patterns of upper trunk on lower trunk to reduce LE tension.  Followed with brief standing and stepping strategies.  Patient needing cues to slow movements and facilitation to weight shift forward especially when advancing left leg.                    OT Short Term Goals - 08/23/16 1639  OT SHORT TERM GOAL  #9   TITLE Pt and family will be mod I with upgraded home activities program - 08/09/2016   Status Achieved     OT SHORT TERM GOAL  #10   TITLE Pt will be min a stand step toilet transfers at least 50% of the time using either grab bar or walker for community transfers   Status Achieved     OT SHORT TERM GOAL  #11   TITLE Pt will consistently move from sit to stand with no more than min a for functional mobility   Status Achieved     OT SHORT TERM GOAL  #12   TITLE Pt will be no more than mod a consistently for bathing at shower level   Status On-going           OT Long Term Goals - 08/23/16 1640      OT LONG TERM GOAL #1   Title Pt, husband and caregiver will be mod I with upgraded HEP prn - 04/26/2016   Status Achieved     OT LONG  TERM GOAL #2   Title Pt, husband and caregiver will be mod I with splint wear and care    Status Achieved     OT LONG TERM GOAL #3   Title Pt will be able to wash face with min a only   Status Achieved     OT LONG TERM GOAL #4   Title Pt will be able to sit without UE support with supevision 100% of the time with no more than 2 vc's while engaged in functional activity.   Status Achieved     OT LONG TERM GOAL #5   Title Pt, husband and caregiver will verbalize understanding of potential after care therapeutic opportunities. - 07/08/2016 date adjusted as pt missed 2 weeks due to hospitalization   Status Achieved     OT LONG TERM GOAL #6   Title Pt will be supervision to wash face consistently   Status Achieved     OT LONG TERM GOAL #7   Title Pt will don shirt with min a and max vc's 50% of the time   Status Achieved     OT LONG TERM GOAL #8   Title Pt will require min a for stand step transfers to toilet using grab bar or walker for community toileting at least 75% of the time. - 09/06/2016   Status On-going     OT LONG TERM GOAL  #9   Baseline Pt will stand with contact gurard with only 1 UE support for static standing balance in prep for abiilty to engage in functional task while standing.    Status Achieved     OT LONG TERM GOAL  #10   TITLE Pt will consistenly move from sit to stand with no more than CGA while caregiver assists with pulling up pants.    Status On-going     OT LONG TERM GOAL  #11   TITLE Pt will require no more than close supervision for static standing with bilateral UE while caregiver assists with hiking pants.    Status On-going     OT LONG TERM GOAL  #12   TITLE Pt will go from stand to sit during ADL activities with no more than min a.    Status On-going               Plan - 08/26/16 1720    Clinical Impression Statement Patient is requiring  greater assistance for erect standing and functional ambulation at this time.  Will continue to  monitor functional and pre functional improvements.     Rehab Potential Fair   Current Impairments/barriers affecting progress: severity of deficits, very slow rate of recovery; pt recently diagnosed with breast cancer - will monitor ability to tolerate therapy   OT Frequency 2x / week   OT Duration 8 weeks   OT Treatment/Interventions Self-care/ADL training;Ultrasound;Moist Heat;Electrical Stimulation;DME and/or AE instruction;Neuromuscular education;Therapeutic exercise;Functional Mobility Training;Manual Therapy;Passive range of motion;Splinting;Therapeutic activities;Balance training;Patient/family education;Visual/perceptual remediation/compensation;Cognitive remediation/compensation   Plan NMR Trunk, UE's, hips, functional mobility, transitional movements   OT Home Exercise Plan Reviewed plan for home management for left elbow range of motion - JAS 3x/day FOR 30 min./time, and locked elbow extesnion brace at night   Consulted and Agree with Plan of Care Patient;Family member/caregiver   Family Member Consulted caregiver Leesa, hsuband [name]      Patient will benefit from skilled therapeutic intervention in order to improve the following deficits and impairments:  Decreased endurance, Decreased skin integrity, Impaired vision/preception, Improper body mechanics, Impaired perceived functional ability, Decreased knowledge of precautions, Cardiopulmonary status limiting activity, Decreased activity tolerance, Decreased knowledge of use of DME, Decreased strength, Impaired flexibility, Improper spinal/pelvic alignment, Impaired sensation, Difficulty walking, Decreased mobility, Decreased balance, Decreased cognition, Decreased range of motion, Impaired tone, Impaired UE functional use, Decreased safety awareness, Decreased coordination, Pain  Visit Diagnosis: Unsteadiness on feet  Muscle weakness (generalized)  Other symptoms and signs involving the nervous system  Abnormal posture  Stiffness  of left shoulder, not elsewhere classified  Stiffness of right shoulder, not elsewhere classified  Stiffness of left elbow, not elsewhere classified  Other lack of coordination  Frontal lobe and executive function deficit    Problem List Patient Active Problem List   Diagnosis Date Noted  . Malignant neoplasm of upper-inner quadrant of left breast in female, estrogen receptor positive ([facility]) 07/14/2016  . Heterotopic ossification of bone 03/10/2016  . Acute on chronic respiratory failure with hypoxia ([place])   . Acute hypoxemic respiratory failure ([place])   . Acute blood loss anemia   . Atelectasis 02/04/2016  . Debility 02/03/2016  . Pneumonia of both lower lobes due to Pseudomonas species ([place])   . Pneumonia of both lower lobes due to methicillin susceptible Staphylococcus aureus (MSSA) ([place])   . Acute respiratory failure with hypoxia ([place])   . Acute encephalopathy   . Seizures ([place])   . Sepsis ([place])   . Hypoxia   . Pain   . HCAP (healthcare-associated pneumonia) 12/28/2015  . Chronic respiratory failure ([place]) 12/28/2015  . Acute tracheobronchitis 12/24/2015  . Tracheostomy tube present ([place])   . Tracheal stenosis   . Chronic respiratory failure with hypoxia ([place])   . Spastic tetraplegia ([place]) 10/20/2015  . Tracheostomy status ([place]) 09/26/2015  . Allergic rhinitis 09/26/2015  . 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 Movement disorder 09/22/2015  . Encephalitis 09/22/2015  . Chest pain 02/07/2014  . Premature atrial contractions 01/13/2014  . Abnormality of gait 12/04/2013  . Hyperlipidemia 12/21/2011  . Cardiovascular risk factor 12/21/2011  . Bunion 01/31/2008  . Metatarsalgia of both feet 09/20/2007  . FLAT FOOT 09/20/2007  . HALLUX RIGIDUS, ACQUIRED 09/20/2007    Mariah Milling , OTR/L 08/26/2016, 5:23 PM  [facility] [address] [place], [place], [address] Phone: [phone]   Fax:   [phone]  Name: Judy Spears MRN: [id number] Date of Birth: 06/01/1950

## 2016-08-26 NOTE — Therapy (Signed)
[facility] [address] [place], [place], [address] Phone: [phone]   Fax:  [phone]  Speech Language Pathology Treatment  Patient Details  Name: Judy Spears MRN: [id number] Date of Birth: Aug 20, 1950 Referring Provider: Erick Colace, MD  Encounter Date: 08/26/2016      End of Session - 08/26/16 1656    Visit Number 46   Number of Visits 46   Date for SLP Re-Evaluation 09/10/16   SLP Start Time 1402   SLP Stop Time  1449   SLP Time Calculation (min) 47 min   Activity Tolerance Patient limited by lethargy      Past Medical History:  Diagnosis Date  . Arthritis    lt great toe  . Complication of anesthesia    pt has had headaches post op-did advise to hydra well preop  . Hair loss    right-sided  . History of exercise stress test    a. ETT (12/15) with 12:00 exercise, no ST changes, occasional PACs.   . Hyperlipidemia   . Insomnia   . Migraine headache   . Movement disorder    resltess in left legs  . Postmenopausal   . 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 Premature atrial contractions    a. Holter (12/15) with 8% PACs, no atrial runs or atrial fibrillation.     Past Surgical History:  Procedure Laterality Date  . BREAST BIOPSY  01/01/2011   Procedure: BREAST BIOPSY WITH NEEDLE LOCALIZATION;  Surgeon: Emelia Loron, MD;  Location: [facility];  Service: General;  Laterality: Left;  left breast wire localization biopsy  . BREAST BIOPSY Right 07/27/2012  . BREAST BIOPSY Right 07/07/2016  . BREAST BIOPSY Left 06/30/2016  . cataracts right eye    . CESAREAN SECTION  1986  . HERNIA REPAIR  2000   RIH  . opirectinal membrane peel    . PEG PLACEMENT    . RHINOPLASTY  1983  . TRACHEOSTOMY      There were no vitals filed for this visit.      Subjective Assessment - 08/26/16 1407    Subjective (P)  "fine" aphonic   Patient is accompained by: (P)  Family member   Special Tests (P)   husband [name], caregiver Leesa   Currently in Pain? (P)  Yes   Pain Score (P)  3    Pain Location (P)  Leg   Pain Orientation (P)  Right   Pain Descriptors / Indicators (P)  Aching   Pain Type (P)  Acute pain   Pain Onset (P)  In the past 7 days               ADULT SLP TREATMENT - 08/26/16 1402      General Information   Behavior/Cognition Lethargic;Requires cueing   Patient Positioning Upright in chair   Oral care provided N/A     Treatment Provided   Treatment provided Cognitive-Linquistic     Cognitive-Linquistic Treatment   Treatment focused on Voice;Dysarthria   Skilled Treatment Patient limited this session by lethargy, required consistent cues for attention and alertness, on average every 5-6 minutes. Pt completed 2 sets of 5 repetitions at 11cm H20; unable to complete full routine as pt with reduced arousal. Required max cues for voicing, breath support for single word responses today; this appears impacted by her lethargy and reduced sustained attention. Provided education to pt, husband and caregiver re: continuing IMT routine at home and adjusting intensity as needed.  Tracheostomy Shiley 4 mm Uncuffed   Tracheostomy Properties Placement Date: 05/08/16 Placement Time: 1030 Placed By: ICU physician Brand: Shiley Size (mm): 4 mm Style: Uncuffed   Status Capped   Site Assessment Clean;Dry   Ties Assessment Clean;Dry;Secure     Assessment / Recommendations / Plan   Plan Other (Comment)  Hold in therapy for 2-3 months     Progression Toward Goals   Progression toward goals --  Goals partially met   Patient/Family Stated Goal Eat and drink by mouth, improve cough, build breath support for increased vocal amplitude          SLP Education - 08/26/16 1655    Education provided Yes   Education Details continue IMRT at home   Person(s) Educated Patient;Spouse;Caregiver(s)   Methods Explanation   Comprehension Verbalized understanding          SLP Short  Term Goals - 08/26/16 1739      SLP SHORT TERM GOAL #1   Title Patient will utilize intelligibility strategies with moderate visual cues from communication partner/visual aid to communicate 5-7 word wants, needs, thoughts and ideas.   Status Achieved     SLP SHORT TERM GOAL #2   Title Patient will utilize intelligibility strategies, breath support with mod A for phrase repetition in 75% of trials.   Status Achieved     SLP SHORT TERM GOAL #3   Title Pt will swallow thin liquids, purees and soft solids in 9/10 trials with no overt signs of aspiration.   Status Achieved     SLP SHORT TERM GOAL #4   Title Patient will tolerate 6oz water, >95% with no overt signs of aspiration.   Status Achieved          SLP Long Term Goals - 08/26/16 1739      SLP LONG TERM GOAL #1   Title Patient will utilize multimodal communication/AAC to clarify wants, needs, thoughts or ideas during communication breakdowns with max A   Status Deferred     SLP LONG TERM GOAL #2   Title Patient will utilize intelligibility strategies, breath support with min A verbal cues for 5-7 word responses with 80% accuracy.   Time 1   Status Not Met     SLP LONG TERM GOAL #3   Title Patient will demonstrate readiness for MBS to determine safety for pleasure feeds by timely initiation of swallow in 3/5 trials with ice chips/H20.   Status Achieved     SLP LONG TERM GOAL #4   Title Pt will consume pleasure feeds of desired consistency (soft solids and thin liquids) with signs of aspiration in fewer than 10% of trials with caregiver providing moderate cues and monitoring for overt signs of aspiration.   Status Deferred     SLP LONG TERM GOAL #5   Title Patient will utilize visual cue for lip closure, dry swallow for secretion management in 75% of opportunities.   Status Achieved     SLP LONG TERM GOAL #6   Title Pt will demo HEP for RMT, dysphagia with min caregiver assistance.   Time 1   Period Weeks   Status  Achieved          Plan - 08/26/16 1741    Clinical Impression Statement Caregivers demonstrated appropriate cuing for IMT. Pt required consistent max verbal and tactile cues for voicing, breath support for single word responses today. Pt has met long term goal for IMT training, voicing goals not met. At this time, pt  and husband are in agreement with hold in therapy pending follow-up with pulmonology pulmonology as pt's voicing, breath support appear limited by loss of air pressure around tracheal stoma. Recommend f/u in 2-3 months.     Speech Therapy Frequency 2x / week   Duration 4 weeks   Treatment/Interventions Aspiration precaution training;Trials of upgraded texture/liquids;Compensatory strategies;Functional tasks;Patient/family education;Cueing hierarchy;Diet toleration management by SLP;Cognitive reorganization;Compensatory techniques;SLP instruction and feedback;Internal/external aids;Multimodal communcation approach;Pharyngeal strengthening exercises   Potential to Achieve Goals Fair   Potential Considerations Ability to learn/carryover information;Co-morbidities;Severity of impairments   SLP Home Exercise Plan Pleasure feeds purees, thin liquids, strict oral care before and after PO, monitor for signs of fatigue, aspiration. RMT   Consulted and Agree with Plan of Care Patient;Family member/caregiver   Family Member Consulted caregiver leesa, husband chip      Patient will benefit from skilled therapeutic intervention in order to improve the following deficits and impairments:   Dysarthria and anarthria  Dysphagia, oropharyngeal phase  Cognitive communication deficit    Problem List Patient Active Problem List   Diagnosis Date Noted  . Malignant neoplasm of upper-inner quadrant of left breast in female, estrogen receptor positive ([place]) 07/14/2016  . Heterotopic ossification of bone 03/10/2016  . Acute on chronic respiratory failure with hypoxia ([place])   . Acute hypoxemic  respiratory failure ([place])   . Acute blood loss anemia   . Atelectasis 02/04/2016  . Debility 02/03/2016  . Pneumonia of both lower lobes due to Pseudomonas species ([place])   . Pneumonia of both lower lobes due to methicillin susceptible Staphylococcus aureus (MSSA) ([place])   . Acute respiratory failure with hypoxia ([place])   . Acute encephalopathy   . Seizures ([place])   . Sepsis ([place])   . Hypoxia   . Pain   . HCAP (healthcare-associated pneumonia) 12/28/2015  . Chronic respiratory failure ([place]) 12/28/2015  . Acute tracheobronchitis 12/24/2015  . Tracheostomy tube present ([place])   . Tracheal stenosis   . Chronic respiratory failure with hypoxia ([place])   . Spastic tetraplegia ([place]) 10/20/2015  . Tracheostomy status ([place]) 09/26/2015  . Allergic rhinitis 09/26/2015  . 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 Movement disorder 09/22/2015  . Encephalitis 09/22/2015  . Chest pain 02/07/2014  . Premature atrial contractions 01/13/2014  . Abnormality of gait 12/04/2013  . Hyperlipidemia 12/21/2011  . Cardiovascular risk factor 12/21/2011  . Bunion 01/31/2008  . Metatarsalgia of both feet 09/20/2007  . FLAT FOOT 09/20/2007  . HALLUX RIGIDUS, ACQUIRED 09/20/2007   Deneise Lever, [place], [place] Speech-Language Pathologist  Aliene Altes 08/26/2016, 5:45 PM  [facility] [address] [place], [place], [address] Phone: [phone]   Fax:  [phone]   Name: SHANEISHA BURKEL MRN: [id number] Date of Birth: 09/04/1950

## 2016-08-31 ENCOUNTER — Ambulatory Visit: Payer: Medicare Other | Admitting: Physical Therapy

## 2016-08-31 DIAGNOSIS — R2689 Other abnormalities of gait and mobility: Secondary | ICD-10-CM

## 2016-08-31 DIAGNOSIS — R29818 Other symptoms and signs involving the nervous system: Secondary | ICD-10-CM

## 2016-09-01 NOTE — Therapy (Signed)
[facility] [address] [place], [place], [address] Phone: [phone]   Fax:  [phone]  Physical Therapy Treatment  Patient Details  Name: Judy Spears MRN: [id number] Date of Birth: May 14, 1950 Referring Provider: Alger Simons, MD/Andrew Letta Pate, MD  Encounter Date: 08/31/2016      PT End of Session - 09/01/16 1424    Visit Number 68   Number of Visits 19   Date for PT Re-Evaluation 10/25/16   Authorization Type BC[employer]   PT Start Time 1448   PT Stop Time 1535   PT Time Calculation (min) 47 min      Past Medical History:  Diagnosis Date  . Arthritis    lt great toe  . Complication of anesthesia    pt has had headaches post op-did advise to hydra well preop  . Hair loss    right-sided  . History of exercise stress test    a. ETT (12/15) with 12:00 exercise, no ST changes, occasional PACs.   . Hyperlipidemia   . Insomnia   . Migraine headache   . Movement disorder    resltess in left legs  . Postmenopausal   . 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 Premature atrial contractions    a. Holter (12/15) with 8% PACs, no atrial runs or atrial fibrillation.     Past Surgical History:  Procedure Laterality Date  . BREAST BIOPSY  01/01/2011   Procedure: BREAST BIOPSY WITH NEEDLE LOCALIZATION;  Surgeon: Rolm Bookbinder, MD;  Location: [facility];  Service: General;  Laterality: Left;  left breast wire localization biopsy  . BREAST BIOPSY Right 07/27/2012  . BREAST BIOPSY Right 07/07/2016  . BREAST BIOPSY Left 06/30/2016  . cataracts right eye    . CESAREAN SECTION  1986  . HERNIA REPAIR  2000   RIH  . opirectinal membrane peel    . PEG PLACEMENT    . RHINOPLASTY  1983  . TRACHEOSTOMY      There were no vitals filed for this visit.      Subjective Assessment - 09/01/16 1422    Subjective Caregiver, [name], reports pt was running a low grade fever on Monday - they took pt to PCP (Dr. Cari Caraway) this am to determine if she may possibly have a UTI - states specimen was negative; she says that husband, [name], discontinued the cancer medication Thurs. night because of side effect of muscle pain/RLE pain                         [facility] Adult PT Treatment/Exercise - 09/01/16 0001      Transfers   Transfers Sit to Stand;Stand to Sit   Sit to Stand 2: Max assist   Sit to Stand Details Manual facilitation for weight shifting;Verbal cues for sequencing;Tactile cues for sequencing;Tactile cues for weight shifting   Stand to Sit 3: Mod assist   Stand to Sit Details (indicate cue type and reason) Tactile cues for initiation;Tactile cues for sequencing;Tactile cues for weight shifting;Tactile cues for posture   Stand to Sit Details cues to flex hips and trunk for controlled desent with stand to sit transfer   Squat Pivot Transfers 2: Max assist   Comments No pain in RLE reported on 08-31-16     Ambulation/Gait   Ambulation/Gait Yes   Ambulation/Gait Assistance 2: Max assist  Antony Salmon, OTR assisted with gait training   Ambulation/Gait Assistance Details intially tried to use RW  for approx. 5' - pt unable to use so use of RW was discontinued and +2 assist provided with tactile cues for weight shifting, cues to keep shoulders back and to bring belly forward for anterior weight shift: cue sot extend trunk as able ;  pt needed mod to max assist to abduct Rt foot during gait as it was scissoring/adducting ; pt also more plantarflexed with Rt heel not consistently in contact iwth floor   Ambulation Distance (Feet) 15 Feet   Assistive device 2 person hand held assist   Gait Pattern Decreased stance time - right;Decreased hip/knee flexion - right;Decreased hip/knee flexion - left;Decreased weight shift to right;Scissoring;Ataxic   Ambulation Surface Level;Indoor     Therapeutic Activites    Therapeutic Activities Other Therapeutic Activities  Pt stood in standing frame for  approx. 5"      Pt stood in standing frame x 5" - this activity was used to facilitate RLE weight bearing and weight shifting toward Rt side which pt  Has had significant difficulty performing within past 7-10 days - etiology of decline in functional mobility is unknown  Pt performed small mini squats in standing frame x 10 reps with mod assist for RLE closed chain strengthening  Pt performed static standing at // bars with mod assist with cues to extend trunk and shoulders and tactile cues for lateral weight shift             PT Short Term Goals - 08/26/16 2011      PT SHORT TERM GOAL #1   Title Perform sit to/from stand from wheelchair or mat with CGA.  09-25-16   Baseline mod assist on 08-26-16   Time 4   Period Weeks   Status New   Target Date 09/25/16     PT SHORT TERM GOAL #2   Title Pt will perform sit to stand from wheelchair to RW with CGA.  07-24-16;  NEW TARGET DATE  09-25-16   Baseline min/guard to min A 07/22/16; min to mod assist on 08-26-16   Time 4   Period Weeks   Status On-going   Target Date 09/25/16     PT SHORT TERM GOAL #3   Title Pt will stand for at least 5" at home with UE support with CGA for RLE and to demo decreased pain in RLE with weight bearing.   09-25-16   Time 4   Period Weeks   Status New   Target Date 09/25/16     PT SHORT TERM GOAL #4   Title Family will report pt rolling from supine to Rt and Lt sides in hospital bed at home with supervision without use of bed rail with cues for positioning of LE's.  07-24-16/  NEW TARGET DATE 09-25-16   Baseline pt needs min assist at times - activity just demonstrated by pt on 06-24-16   Time 4   Status On-going   Target Date 09/25/16     PT SHORT TERM GOAL #5   Title Basic transfers from wheelchair to bed or commode with +1 min assist.  NEW TARGET DATE 09-25-16   Baseline min A 07/22/16 with RW; mod to max assist on 08-26-16   Time 4   Period Weeks   Status New   Target Date 09/25/16     PT SHORT TERM  GOAL #6   Title Amb. 85' with RW with +1 min assist.  09-26-16   Baseline 13" with RW with +2 max asssit - 08-26-16  Time 4   Period Weeks   Status New           PT Long Term Goals - 08/26/16 2021      PT LONG TERM GOAL #1   Title Pt will perform supine rolling R and L at mod I level in order to indicate improved independence with bed mobility.  (Target Date: 06/25/16)/ 08-23-16/10-25-16   Baseline inconsistent in performance - pt needs cues for LE flexion to assist with pushing for rolling and cues for UE positioning   Time 8   Period Weeks   Status On-going   Target Date 10/25/16     PT LONG TERM GOAL #2   Title Pt will perform sit>SL R or L modified independently in order to indicate improved independence with getting into bed.   08-23-16/ 10-25-16   Baseline mod assist on 08-26-16   Time 8   Period Weeks   Status On-going   Target Date 10/25/16     PT LONG TERM GOAL #3   Title Pt will transfer Lt/Rt sidelying to sitting with CGA for incr. independence with getting out of bed.  08-23-16/ 10-25-16   Baseline min to mod assist needed -08-26-16   Time 8   Period Weeks   Status On-going   Target Date 10/25/16     PT LONG TERM GOAL #4   Title Pt will perform basic transfers with min assist to indicate improved functional independence.  08-23-16/ 10-25-16   Baseline mod to max assist - 08-26-16   Time 8   Period Weeks   Status Revised   Target Date 10/25/16     PT LONG TERM GOAL #5   Title Pt will perform static standing with bil. UE support with distant supervision for >/= 10" for improved standing balance.  08-23-16/ 10-25-16   Baseline mod assist for standing with pt unable to tolerate weight bearing on RLE due to c/o pain - 08-26-16   Time 8   Period Weeks   Status New   Target Date 10/25/16     PT LONG TERM GOAL #6   Title Pt will demonstrate ability to ambulate >/= 50' w/ LRAD at min A level in order to assist with getting around home.  CONT LTG til 08-23-16;         NEW TARGET DATE  10-25-16   Baseline RW used to amb. 13' with +2 max assist on 08-26-16   Time 8   Period Weeks   Status Revised     PT LONG TERM GOAL #7   Title Perform sit to stand and stand to sit to RW or to another object with CGA.  08-23-16/ 10-25-16   Baseline mod assist needed on 08-26-16   Time 8   Period Weeks   Status New               Plan - 09/01/16 1425    Clinical Impression Statement Pt continues to regress/decline in functional status regarding standing/ambulation with significantly more assistance required.  Pt reported no pain in RLE during session, however, was unable to initially achieve Rt foot flat with Rt foot plantarflexed and Rt knee flexed in standing.  Increased tone noted in RLE and pt had difficulty keeping RLE abducted during gait due to increased tone with adduction and occasional near scissoring of RLE.  Pt would benefit from evaluation by Dr. [name] for another Botox injection for RLE.     Rehab Potential Fair   Clinical Impairments Affecting  Rehab Potential Chronicity of condition, continued medical management, but has very strong family and financial support   PT Frequency 2x / week   PT Duration 8 weeks   PT Treatment/Interventions ADLs/Self Care Home Management;DME Instruction;Gait training;Stair training;Functional mobility training;Therapeutic activities;Patient/family education;Cognitive remediation;Neuromuscular re-education;Therapeutic exercise;Balance training;Orthotic Fit/Training;Wheelchair mobility training;Manual techniques;Splinting;Visual/perceptual remediation/compensation;Vestibular;Passive range of motion;Electrical Stimulation   PT Next Visit Plan cont to work on transfers, gait training and standing balance; can work on trunk control, dynamic sitting if pt unable to perform standing/gait activities   Recommended Other Services recommend follow up with Dr. [name] regarding possible Botox injection RLE due to increased tone/spasticity; also evaluation due to  decline in functional status within past 2 weeks (UTI was ruled out by pt's PCP on 08-31-16)   Consulted and Agree with Plan of Care Patient;Family member/caregiver   Family Member Consulted caregiver, [name]       Patient will benefit from skilled therapeutic intervention in order to improve the following deficits and impairments:  Abnormal gait, Decreased balance, Decreased endurance, Decreased cognition, Cardiopulmonary status limiting activity, Decreased activity tolerance, Decreased coordination, Decreased strength, Impaired flexibility, Impaired tone, Decreased mobility, Increased muscle spasms, Impaired vision/preception, Decreased range of motion, Impaired UE functional use, Postural dysfunction, Improper body mechanics, Impaired perceived functional ability, Decreased knowledge of precautions, Decreased knowledge of use of DME  Visit Diagnosis: Other symptoms and signs involving the nervous system  Other abnormalities of gait and mobility     Problem List Patient Active Problem List   Diagnosis Date Noted  . Malignant neoplasm of upper-inner quadrant of left breast in female, estrogen receptor positive ([place]) 07/14/2016  . Heterotopic ossification of bone 03/10/2016  . Acute on chronic respiratory failure with hypoxia ([place])   . Acute hypoxemic respiratory failure ([place])   . Acute blood loss anemia   . Atelectasis 02/04/2016  . Debility 02/03/2016  . Pneumonia of both lower lobes due to Pseudomonas species ([place])   . Pneumonia of both lower lobes due to methicillin susceptible Staphylococcus aureus (MSSA) ([place])   . Acute respiratory failure with hypoxia ([place])   . Acute encephalopathy   . Seizures ([place])   . Sepsis ([place])   . Hypoxia   . Pain   . HCAP (healthcare-associated pneumonia) 12/28/2015  . Chronic respiratory failure ([place]) 12/28/2015  . Acute tracheobronchitis 12/24/2015  . Tracheostomy tube present ([place])   . Tracheal stenosis   . Chronic respiratory failure with hypoxia  ([place])   . Spastic tetraplegia ([place]) 10/20/2015  . Tracheostomy status ([place]) 09/26/2015  . Allergic rhinitis 09/26/2015  . 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 Movement disorder 09/22/2015  . Encephalitis 09/22/2015  . Chest pain 02/07/2014  . Premature atrial contractions 01/13/2014  . Abnormality of gait 12/04/2013  . Hyperlipidemia 12/21/2011  . Cardiovascular risk factor 12/21/2011  . Bunion 01/31/2008  . Metatarsalgia of both feet 09/20/2007  . FLAT FOOT 09/20/2007  . HALLUX RIGIDUS, ACQUIRED 09/20/2007    [place], PT 09/01/2016, 5:22 PM  [facility] [address] [place], [place], [address] Phone: [phone]   Fax:  [phone]  Name: CADY HAFEN MRN: [id number] Date of Birth: [birth year]

## 2016-09-02 ENCOUNTER — Ambulatory Visit: Payer: Self-pay | Admitting: Physical Therapy

## 2016-09-02 ENCOUNTER — Ambulatory Visit: Payer: Medicare Other | Admitting: Occupational Therapy

## 2016-09-02 ENCOUNTER — Ambulatory Visit: Payer: Medicare Other | Admitting: Oncology

## 2016-09-02 ENCOUNTER — Encounter: Payer: Self-pay | Admitting: Occupational Therapy

## 2016-09-02 DIAGNOSIS — M25622 Stiffness of left elbow, not elsewhere classified: Secondary | ICD-10-CM

## 2016-09-02 DIAGNOSIS — M6281 Muscle weakness (generalized): Secondary | ICD-10-CM

## 2016-09-02 DIAGNOSIS — R293 Abnormal posture: Secondary | ICD-10-CM

## 2016-09-02 DIAGNOSIS — M25612 Stiffness of left shoulder, not elsewhere classified: Secondary | ICD-10-CM

## 2016-09-02 DIAGNOSIS — R278 Other lack of coordination: Secondary | ICD-10-CM

## 2016-09-02 DIAGNOSIS — R41844 Frontal lobe and executive function deficit: Secondary | ICD-10-CM

## 2016-09-02 DIAGNOSIS — R29818 Other symptoms and signs involving the nervous system: Secondary | ICD-10-CM

## 2016-09-02 DIAGNOSIS — R2681 Unsteadiness on feet: Secondary | ICD-10-CM

## 2016-09-02 DIAGNOSIS — R2689 Other abnormalities of gait and mobility: Secondary | ICD-10-CM | POA: Diagnosis not present

## 2016-09-02 DIAGNOSIS — M25611 Stiffness of right shoulder, not elsewhere classified: Secondary | ICD-10-CM

## 2016-09-02 NOTE — Therapy (Signed)
[facility] [address] [place], [place], [address] Phone: [phone]   Fax:  [phone]  Occupational Therapy Treatment  Patient Details  Name: Judy Spears MRN: [id number] Date of Birth: [birth year] Referring Provider: Dr. Quita Skye. [name]  Encounter Date: 09/02/2016      OT End of Session - 09/02/16 1609    Visit Number 78   Number of Visits 67   Date for OT Re-Evaluation 09/07/16   Authorization Type BC[employer] unlimited visits   OT Start Time 5625   OT Stop Time 1401   OT Time Calculation (min) 44 min   Activity Tolerance Patient tolerated treatment well      Past Medical History:  Diagnosis Date  . Arthritis    lt great toe  . Complication of anesthesia    pt has had headaches post op-did advise to hydra well preop  . Hair loss    right-sided  . History of exercise stress test    a. ETT (12/15) with 12:00 exercise, no ST changes, occasional PACs.   . Hyperlipidemia   . Insomnia   . Migraine headache   . Movement disorder    resltess in left legs  . Postmenopausal   . 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 Premature atrial contractions    a. Holter (12/15) with 8% PACs, no atrial runs or atrial fibrillation.     Past Surgical History:  Procedure Laterality Date  . BREAST BIOPSY  01/01/2011   Procedure: BREAST BIOPSY WITH NEEDLE LOCALIZATION;  Surgeon: Rolm Bookbinder, MD;  Location: [facility];  Service: General;  Laterality: Left;  left breast wire localization biopsy  . BREAST BIOPSY Right 07/27/2012  . BREAST BIOPSY Right 07/07/2016  . BREAST BIOPSY Left 06/30/2016  . cataracts right eye    . CESAREAN SECTION  1986  . HERNIA REPAIR  2000   RIH  . opirectinal membrane peel    . PEG PLACEMENT    . RHINOPLASTY  1983  . TRACHEOSTOMY      There were no vitals filed for this visit.      Subjective Assessment - 09/02/16 1558    Subjective  Pt with signficant moaning today especially  when lying down but said "no" when asked if she was in pain in this position.     Patient is accompained by: Family member  husband [name] and caregiver Leesa   Pertinent History see epic snapshot - ABI/encephalitis/hypoxia from virus; recent hospitalization due to ARF;    Patient Stated Goals Pt unable to state.    Currently in Pain? Yes   Pain Location Leg   Pain Orientation Right;Left   Pain Descriptors / Indicators Grimacing;Guarding   Pain Type Acute pain   Pain Radiating Towards pt c/o pain in back of thighs, hips and lower back today with standing.  Pt only able tolerate approximately 10-15 seconds of standing today   Pain Onset 1 to 4 weeks ago   Pain Frequency Intermittent   Aggravating Factors  standing, WB'ing   Pain Relieving Factors sitting                      OT Treatments/Exercises (OP) - 09/02/16 0001      Neurological Re-education Exercises   Other Exercises 1 Pt very lethargic today and caregiver reports that pt has been sleeping througout the day unless directly stimulated.  Pt with even more delay in responses than usual, and with significant increase in tone, especially  LE's.  Pt required significant prep to address tone and alignment prior to sit to stand; pt then required increased assistance for sit to stand (was consistently min a and now requiring mod to max assist). Pt only able to tolerate up in standing for approximately 10-15 seconds each time and c/o of LE and back pain with standing. Pt demonstrating increased extrapyramidal movements again today (pt has not displayed this in several months).  Pt also required far more assist for static standing (mod to max vs min) and with transfers - mod a (had been consistently light min a).  Pt's husband left session to attempt to make appt with rehab MD.  Feel pt is demonstrating significant set back functionally from a cognitive, physical and pain standpoint as well as functional mobility. Pt's caregiver states  they have not been able to do any of the pt's home program for several days and that toileting and ADL's have once again become very diffifcult for one person.  Will send message and route note to rehab MD.                  OT Short Term Goals - 09/02/16 1607      OT SHORT TERM GOAL  #9   TITLE Pt and family will be mod I with upgraded home activities program - 08/09/2016   Status Achieved     OT SHORT TERM GOAL  #10   TITLE Pt will be min a stand step toilet transfers at least 50% of the time using either grab bar or walker for community transfers   Status Achieved     OT SHORT TERM GOAL  #11   TITLE Pt will consistently move from sit to stand with no more than min a for functional mobility   Status Achieved     OT SHORT TERM GOAL  #12   TITLE Pt will be no more than mod a consistently for bathing at shower level   Status On-going           OT Long Term Goals - 09/02/16 1607      OT [place] #1   Title Pt, husband and caregiver will be mod I with upgraded HEP prn - 04/26/2016   Status Achieved     OT LONG TERM GOAL #2   Title Pt, husband and caregiver will be mod I with splint wear and care    Status Achieved     OT LONG TERM GOAL #3   Title Pt will be able to wash face with min a only   Status Achieved     OT LONG TERM GOAL #4   Title Pt will be able to sit without UE support with supevision 100% of the time with no more than 2 vc's while engaged in functional activity.   Status Achieved     OT LONG TERM GOAL #5   Title Pt, husband and caregiver will verbalize understanding of potential after care therapeutic opportunities. - 07/08/2016 date adjusted as pt missed 2 weeks due to hospitalization   Status Achieved     OT LONG TERM GOAL #6   Title Pt will be supervision to wash face consistently   Status Achieved     OT LONG TERM GOAL #7   Title Pt will don shirt with min a and max vc's 50% of the time   Status Achieved     OT LONG TERM GOAL #8    Title Pt will require min  a for stand step transfers to toilet using grab bar or walker for community toileting at least 75% of the time. - 09/06/2016   Status On-going     OT LONG TERM GOAL  #9   Baseline Pt will stand with contact gurard with only 1 UE support for static standing balance in prep for abiilty to engage in functional task while standing.    Status Achieved     OT LONG TERM GOAL  #10   TITLE Pt will consistenly move from sit to stand with no more than CGA while caregiver assists with pulling up pants.    Status On-going     OT LONG TERM GOAL  #11   TITLE Pt will require no more than close supervision for static standing with bilateral UE while caregiver assists with hiking pants.    Status On-going     OT LONG TERM GOAL  #12   TITLE Pt will go from stand to sit during ADL activities with no more than min a.    Status On-going               Plan - 09/02/16 1607    Clinical Impression Statement Pt demonstrating significant decline cogntively, physcially and functionally over past 2 weeks  See note for details.    Rehab Potential Fair   Current Impairments/barriers affecting progress: severity of deficits, very slow rate of recovery; pt recently diagnosed with breast cancer - will monitor ability to tolerate therapy   OT Frequency 2x / week   OT Duration 8 weeks   OT Treatment/Interventions Self-care/ADL training;Ultrasound;Moist Heat;Electrical Stimulation;DME and/or AE instruction;Neuromuscular education;Therapeutic exercise;Functional Mobility Training;Manual Therapy;Passive range of motion;Splinting;Therapeutic activities;Balance training;Patient/family education;Visual/perceptual remediation/compensation;Cognitive remediation/compensation   Plan NMR for trunk, UE's hips,functional mobility, transitional movements as able given current status change.   Consulted and Agree with Plan of Care Patient;Family member/caregiver   Family Member Consulted caregiver Leesa,  hsuband [name]      Patient will benefit from skilled therapeutic intervention in order to improve the following deficits and impairments:  Decreased endurance, Decreased skin integrity, Impaired vision/preception, Improper body mechanics, Impaired perceived functional ability, Decreased knowledge of precautions, Cardiopulmonary status limiting activity, Decreased activity tolerance, Decreased knowledge of use of DME, Decreased strength, Impaired flexibility, Improper spinal/pelvic alignment, Impaired sensation, Difficulty walking, Decreased mobility, Decreased balance, Decreased cognition, Decreased range of motion, Impaired tone, Impaired UE functional use, Decreased safety awareness, Decreased coordination, Pain  Visit Diagnosis: Other symptoms and signs involving the nervous system  Unsteadiness on feet  Muscle weakness (generalized)  Abnormal posture  Stiffness of left shoulder, not elsewhere classified  Stiffness of right shoulder, not elsewhere classified  Stiffness of left elbow, not elsewhere classified  Other lack of coordination  Frontal lobe and executive function deficit    Problem List Patient Active Problem List   Diagnosis Date Noted  . Malignant neoplasm of upper-inner quadrant of left breast in female, estrogen receptor positive ([facility]) 07/14/2016  . Heterotopic ossification of bone 03/10/2016  . Acute on chronic respiratory failure with hypoxia ([facility])   . Acute hypoxemic respiratory failure ([facility])   . Acute blood loss anemia   . Atelectasis 02/04/2016  . Debility 02/03/2016  . Pneumonia of both lower lobes due to Pseudomonas species ([facility])   . Pneumonia of both lower lobes due to methicillin susceptible Staphylococcus aureus (MSSA) ([facility])   . Acute respiratory failure with hypoxia ([facility])   . Acute encephalopathy   . Seizures ([facility])   . Sepsis ([facility])   .  Hypoxia   . Pain   . HCAP (healthcare-associated pneumonia) 12/28/2015  . Chronic respiratory failure ([facility])  12/28/2015  . Acute tracheobronchitis 12/24/2015  . Tracheostomy tube present ([facility])   . Tracheal stenosis   . Chronic respiratory failure with hypoxia ([facility])   . 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 Tracheostomy status ([facility]) 09/26/2015  . Allergic rhinitis 09/26/2015  . 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 Movement disorder 09/22/2015  . Encephalitis 09/22/2015  . Chest pain 02/07/2014  . Premature atrial contractions 01/13/2014  . Abnormality of gait 12/04/2013  . Hyperlipidemia 12/21/2011  . Cardiovascular risk factor 12/21/2011  . Bunion 01/31/2008  . Metatarsalgia of both feet 09/20/2007  . FLAT FOOT 09/20/2007  . HALLUX RIGIDUS, ACQUIRED 09/20/2007    Quay Burow, OTR/L 09/02/2016, 4:11 PM  [facility] [address] [place], [place], [address] Phone: [phone]   Fax:  [phone]  Name: Judy Spears MRN: [id number] Date of Birth: 01/26/1951

## 2016-09-03 ENCOUNTER — Ambulatory Visit: Payer: Medicare Other | Admitting: Physical Therapy

## 2016-09-03 ENCOUNTER — Encounter: Payer: Self-pay | Admitting: Occupational Therapy

## 2016-09-03 ENCOUNTER — Telehealth: Payer: Self-pay

## 2016-09-03 ENCOUNTER — Ambulatory Visit: Payer: Medicare Other | Admitting: Rehabilitation

## 2016-09-03 ENCOUNTER — Ambulatory Visit: Payer: Medicare Other | Admitting: Occupational Therapy

## 2016-09-03 DIAGNOSIS — M25611 Stiffness of right shoulder, not elsewhere classified: Secondary | ICD-10-CM

## 2016-09-03 DIAGNOSIS — M6281 Muscle weakness (generalized): Secondary | ICD-10-CM

## 2016-09-03 DIAGNOSIS — R2689 Other abnormalities of gait and mobility: Secondary | ICD-10-CM | POA: Diagnosis not present

## 2016-09-03 DIAGNOSIS — R29818 Other symptoms and signs involving the nervous system: Secondary | ICD-10-CM

## 2016-09-03 DIAGNOSIS — R2681 Unsteadiness on feet: Secondary | ICD-10-CM

## 2016-09-03 DIAGNOSIS — R278 Other lack of coordination: Secondary | ICD-10-CM

## 2016-09-03 DIAGNOSIS — R41844 Frontal lobe and executive function deficit: Secondary | ICD-10-CM

## 2016-09-03 DIAGNOSIS — M25622 Stiffness of left elbow, not elsewhere classified: Secondary | ICD-10-CM

## 2016-09-03 DIAGNOSIS — M25612 Stiffness of left shoulder, not elsewhere classified: Secondary | ICD-10-CM

## 2016-09-03 DIAGNOSIS — R293 Abnormal posture: Secondary | ICD-10-CM

## 2016-09-03 NOTE — Therapy (Signed)
[facility] [address] [place], [place], [address] Phone: [phone]   Fax:  [phone]  Occupational Therapy Treatment  Patient Details  Name: Judy Spears MRN: [id number] Date of Birth: 1950-07-15 Referring Provider: Dr. Quita Skye. [name]  Encounter Date: 09/03/2016      OT End of Session - 09/03/16 1652    Visit Number 48   Number of Visits 51   Date for OT Re-Evaluation 09/07/16   Authorization Type BCBS unlimited visits   OT Start Time 1318   OT Stop Time 1359   OT Time Calculation (min) 41 min   Activity Tolerance Patient limited by lethargy;Patient limited by pain;Other (comment)  limited by recent decline      Past Medical History:  Diagnosis Date  . Arthritis    lt great toe  . Complication of anesthesia    pt has had headaches post op-did advise to hydra well preop  . Hair loss    right-sided  . History of exercise stress test    a. ETT (12/15) with 12:00 exercise, no ST changes, occasional PACs.   . Hyperlipidemia   . Insomnia   . Migraine headache   . Movement disorder    resltess in left legs  . Postmenopausal   . 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 Premature atrial contractions    a. Holter (12/15) with 8% PACs, no atrial runs or atrial fibrillation.     Past Surgical History:  Procedure Laterality Date  . BREAST BIOPSY  01/01/2011   Procedure: BREAST BIOPSY WITH NEEDLE LOCALIZATION;  Surgeon: Rolm Bookbinder, MD;  Location: [facility];  Service: General;  Laterality: Left;  left breast wire localization biopsy  . BREAST BIOPSY Right 07/27/2012  . BREAST BIOPSY Right 07/07/2016  . BREAST BIOPSY Left 06/30/2016  . cataracts right eye    . CESAREAN SECTION  1986  . HERNIA REPAIR  2000   RIH  . opirectinal membrane peel    . PEG PLACEMENT    . RHINOPLASTY  1983  . TRACHEOSTOMY      There were no vitals filed for this visit.      Subjective Assessment - 09/03/16  1322    Subjective  Caregiver states pt had significant pain in low back this morning and used icy hot. Pt states no pain at beginnng of session.    Patient is accompained by: Family member  husband [name] and caregiver Leesa   Pertinent History see epic snapshot - ABI/encephalitis/hypoxia from virus; recent hospitalization due to ARF;    Patient Stated Goals Pt unable to state.    Currently in Pain? No/denies                      OT Treatments/Exercises (OP) - 09/03/16 0001      Neurological Re-education Exercises   Other Exercises 1 Neuro re ed to address squat pivot transfers to mat - pt requires max assist today (was previously requiring min a).  Also addressed sitting balance and finding midline - pt with signficant bias to L and benefitted from sideleaning on R elbow whle reaching with LUE. Pt with signficant tone/tightness in LE's and trunk - transitioned into supine on wedge and addressed reducting tone in LE"s and trunk with LE trunk initiated rotation as well as moving in alternating and reciprocal patterns of flexion /extenstion with LE"s.  Transitioned back into sitting and addressed sit to stand - pt needed mod a from elevated surface  today and tolerated approximately 30 seconds in standing.  Pt benefits from repetition with this task.  Caregiver and husband stated the earliest pt can be seen by MD is 10/11/2016. Pt more alert today than yesterday however still not at baseline.                     OT Short Term Goals - 09/03/16 1650      OT SHORT TERM GOAL  #9   TITLE Pt and family will be mod I with upgraded home activities program - 08/09/2016   Status Achieved     OT SHORT TERM GOAL  #10   TITLE Pt will be min a stand step toilet transfers at least 50% of the time using either grab bar or walker for community transfers   Status Achieved     OT SHORT TERM GOAL  #11   TITLE Pt will consistently move from sit to stand with no more than min a for functional  mobility   Status Achieved     OT SHORT TERM GOAL  #12   TITLE Pt will be no more than mod a consistently for bathing at shower level   Status On-going           OT Long Term Goals - 09/03/16 1650      OT LONG TERM GOAL #1   Title Pt, husband and caregiver will be mod I with upgraded HEP prn - 04/26/2016   Status Achieved     OT LONG TERM GOAL #2   Title Pt, husband and caregiver will be mod I with splint wear and care    Status Achieved     OT LONG TERM GOAL #3   Title Pt will be able to wash face with min a only   Status Achieved     OT LONG TERM GOAL #4   Title Pt will be able to sit without UE support with supevision 100% of the time with no more than 2 vc's while engaged in functional activity.   Status Achieved     OT LONG TERM GOAL #5   Title Pt, husband and caregiver will verbalize understanding of potential after care therapeutic opportunities. - 07/08/2016 date adjusted as pt missed 2 weeks due to hospitalization   Status Achieved     OT LONG TERM GOAL #6   Title Pt will be supervision to wash face consistently   Status Achieved     OT LONG TERM GOAL #7   Title Pt will don shirt with min a and max vc's 50% of the time   Status Achieved     OT LONG TERM GOAL #8   Title Pt will require min a for stand step transfers to toilet using grab bar or walker for community toileting at least 75% of the time. - 09/06/2016   Status On-going     OT LONG TERM GOAL  #9   Baseline Pt will stand with contact gurard with only 1 UE support for static standing balance in prep for abiilty to engage in functional task while standing.    Status Achieved     OT LONG TERM GOAL  #10   TITLE Pt will consistenly move from sit to stand with no more than CGA while caregiver assists with pulling up pants.    Status On-going     OT LONG TERM GOAL  #11   TITLE Pt will require no more than close supervision for static standing with  bilateral UE while caregiver assists with hiking pants.     Status On-going     OT LONG TERM GOAL  #12   TITLE Pt will go from stand to sit during ADL activities with no more than min a.    Status On-going               Plan - 09/03/16 1650    Clinical Impression Statement Pt slightly more alert today however remains in overall decline.    Rehab Potential Fair   Current Impairments/barriers affecting progress: severity of deficits, very slow rate of recovery; pt recently diagnosed with breast cancer - will monitor ability to tolerate therapy   OT Frequency 2x / week   OT Duration 8 weeks   OT Treatment/Interventions Self-care/ADL training;Ultrasound;Moist Heat;Electrical Stimulation;DME and/or AE instruction;Neuromuscular education;Therapeutic exercise;Functional Mobility Training;Manual Therapy;Passive range of motion;Splinting;Therapeutic activities;Balance training;Patient/family education;Visual/perceptual remediation/compensation;Cognitive remediation/compensation   Plan NMR for trunk, UE's, hips, functional mobility, transitional movements as able given current status change.    Consulted and Agree with Plan of Care Patient;Family member/caregiver   Family Member Consulted caregiver Leesa, hsuband [name]      Patient will benefit from skilled therapeutic intervention in order to improve the following deficits and impairments:  Decreased endurance, Decreased skin integrity, Impaired vision/preception, Improper body mechanics, Impaired perceived functional ability, Decreased knowledge of precautions, Cardiopulmonary status limiting activity, Decreased activity tolerance, Decreased knowledge of use of DME, Decreased strength, Impaired flexibility, Improper spinal/pelvic alignment, Impaired sensation, Difficulty walking, Decreased mobility, Decreased balance, Decreased cognition, Decreased range of motion, Impaired tone, Impaired UE functional use, Decreased safety awareness, Decreased coordination, Pain  Visit Diagnosis: Other symptoms and  signs involving the nervous system  Unsteadiness on feet  Muscle weakness (generalized)  Abnormal posture  Stiffness of left shoulder, not elsewhere classified  Stiffness of right shoulder, not elsewhere classified  Stiffness of left elbow, not elsewhere classified  Other lack of coordination  Frontal lobe and executive function deficit    Problem List Patient Active Problem List   Diagnosis Date Noted  . Malignant neoplasm of upper-inner quadrant of left breast in female, estrogen receptor positive ([facility]) 07/14/2016  . Heterotopic ossification of bone 03/10/2016  . Acute on chronic respiratory failure with hypoxia ([facility])   . Acute hypoxemic respiratory failure ([facility])   . Acute blood loss anemia   . Atelectasis 02/04/2016  . Debility 02/03/2016  . Pneumonia of both lower lobes due to Pseudomonas species ([facility])   . Pneumonia of both lower lobes due to methicillin susceptible Staphylococcus aureus (MSSA) ([facility])   . Acute respiratory failure with hypoxia ([facility])   . Acute encephalopathy   . Seizures ([facility])   . Sepsis ([facility])   . Hypoxia   . Pain   . HCAP (healthcare-associated pneumonia) 12/28/2015  . Chronic respiratory failure ([facility]) 12/28/2015  . Acute tracheobronchitis 12/24/2015  . Tracheostomy tube present ([facility])   . Tracheal stenosis   . Chronic respiratory failure with hypoxia ([facility])   . 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 Tracheostomy status ([facility]) 09/26/2015  . Allergic rhinitis 09/26/2015  . 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 Movement disorder 09/22/2015  . Encephalitis 09/22/2015  . Chest pain 02/07/2014  . Premature atrial contractions 01/13/2014  . Abnormality of gait 12/04/2013  . Hyperlipidemia 12/21/2011  . Cardiovascular risk factor 12/21/2011  . Bunion 01/31/2008  . Metatarsalgia of both feet 09/20/2007  . FLAT FOOT 09/20/2007  . HALLUX RIGIDUS, ACQUIRED 09/20/2007    Quay Burow, OTR/L 09/03/2016, 4:54 PM  [facility] [address] [place], [place], [address] Phone: [phone]   Fax:  [phone]  Name: Judy Spears MRN: [id number] Date of Birth: [birth year]

## 2016-09-03 NOTE — Telephone Encounter (Signed)
Mr. Banker would like to speak with Dr. [name] about getting patients medication changed.  She is sleeping all the time and would like to speak with him.

## 2016-09-04 ENCOUNTER — Encounter: Payer: Self-pay | Admitting: Physical Therapy

## 2016-09-04 NOTE — Therapy (Addendum)
[facility] [address] [place], [place], [address] Phone: [phone]   Fax:  [phone]  Physical Therapy Treatment  Patient Details  Name: Judy Spears MRN: [id number] Date of Birth: 1950/09/16 Referring Provider: Alger Simons, MD/Andrew Letta Pate, MD  Encounter Date: 09/03/2016      PT End of Session - 09/04/16 1054    Visit Number 20   Number of Visits 49   Date for PT Re-Evaluation 10/25/16   Authorization Type BCBS   PT Start Time 1402   PT Stop Time 1446   PT Time Calculation (min) 44 min      Past Medical History:  Diagnosis Date  . Arthritis    lt great toe  . Complication of anesthesia    pt has had headaches post op-did advise to hydra well preop  . Hair loss    right-sided  . History of exercise stress test    a. ETT (12/15) with 12:00 exercise, no ST changes, occasional PACs.   . Hyperlipidemia   . Insomnia   . Migraine headache   . Movement disorder    resltess in left legs  . Postmenopausal   . 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 Premature atrial contractions    a. Holter (12/15) with 8% PACs, no atrial runs or atrial fibrillation.     Past Surgical History:  Procedure Laterality Date  . BREAST BIOPSY  01/01/2011   Procedure: BREAST BIOPSY WITH NEEDLE LOCALIZATION;  Surgeon: Rolm Bookbinder, MD;  Location: [facility];  Service: General;  Laterality: Left;  left breast wire localization biopsy  . BREAST BIOPSY Right 07/27/2012  . BREAST BIOPSY Right 07/07/2016  . BREAST BIOPSY Left 06/30/2016  . cataracts right eye    . CESAREAN SECTION  1986  . HERNIA REPAIR  2000   RIH  . opirectinal membrane peel    . PEG PLACEMENT    . RHINOPLASTY  1983  . TRACHEOSTOMY      There were no vitals filed for this visit.    Subjective     Subjective Assessment - 09/05/16 1717    Pertinent History Precautions:  PEG.  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  Another round of botox in LLE on 02/06/16   Patient Stated Goals Per husband, "For [place] to be as independent as possible."   Currently in Pain? No/denies         [facility] Adult PT Treatment/Exercise - 09/05/16 0001      Transfers   Transfers Sit to Stand;Stand to Sit;Stand Pivot Transfers   Sit to Stand 2: Max assist   Sit to Stand Details Manual facilitation for weight shifting;Verbal cues for sequencing;Tactile cues for sequencing;Tactile cues for weight shifting   Stand to Sit 3: Mod assist   Stand to Sit Details (indicate cue type and reason) Tactile cues for initiation;Tactile cues for sequencing;Tactile cues for weight shifting;Tactile cues for posture   Stand Pivot Transfers 2: Max assist   Stand Pivot Transfer Details (indicate cue type and reason) from mat to w/c on pt's left; max assist for balance, upright posture, wt-shifting and pivoting both feet     Static Sitting Balance   Static Sitting - Balance Support No upper extremity supported;Feet supported   Static Sitting - Level of Assistance 4: Min assist  Dynamic Sitting Balance   Dynamic Sitting - Balance Support Right upper extremity supported;Left upper extremity supported;No upper extremity supported;Feet supported   Dynamic Sitting - Level of Assistance 4: Min assist     Static Standing Balance   Static Standing - Balance Support Bilateral upper extremity supported   Static Standing - Level of Assistance 2: Max assist   Static Standing - Comment/# of Minutes multiple trials for up to 2 minutes with pt's hands on PT's shoulders; limited by pt's Legs repeatedly "bouncing" up and down despite attempts to weight shift posteriorly onto her heels, left or right, PT trying to stabilize at her knees     Neuro Re-ed    Neuro Re-ed Details  Sitting edge of mat  between transfers and static standing. Worked on bil trunk rotation (seemed to be the technique that "quieted" her UE tone/bouncing the best), upright posture/postural control, weightshifting laterally and anteriorly (with scooting forward and backward in sitting), and trunk ROM/stretching. All movements required min to mod assist/facilitation.                                      PT Short Term Goals - 08/26/16 2011      PT SHORT TERM GOAL #1   Title Perform sit to/from stand from wheelchair or mat with CGA.  09-25-16   Baseline mod assist on 08-26-16   Time 4   Period Weeks   Status New   Target Date 09/25/16     PT SHORT TERM GOAL #2   Title Pt will perform sit to stand from wheelchair to RW with CGA.  07-24-16;  NEW TARGET DATE  09-25-16   Baseline min/guard to min A 07/22/16; min to mod assist on 08-26-16   Time 4   Period Weeks   Status On-going   Target Date 09/25/16     PT SHORT TERM GOAL #3   Title Pt will stand for at least 5" at home with UE support with CGA for RLE and to demo decreased pain in RLE with weight bearing.   09-25-16   Time 4   Period Weeks   Status New   Target Date 09/25/16     PT SHORT TERM GOAL #4   Title Family will report pt rolling from supine to Rt and Lt sides in hospital bed at home with supervision without use of bed rail with cues for positioning of LE's.  07-24-16/  NEW TARGET DATE 09-25-16   Baseline pt needs min assist at times - activity just demonstrated by pt on 06-24-16   Time 4   Status On-going   Target Date 09/25/16     PT SHORT TERM GOAL #5   Title Basic transfers from wheelchair to bed or commode with +1 min assist.  NEW TARGET DATE 09-25-16   Baseline min A 07/22/16 with RW; mod to max assist on 08-26-16   Time 4   Period Weeks   Status New   Target Date 09/25/16     PT SHORT TERM GOAL #6   Title Amb. 19' with RW with +1 min assist.  09-26-16   Baseline 13" with RW with +2 max asssit - 08-26-16   Time 4   Period  Weeks   Status New           PT Long Term Goals - 08/26/16 2021      PT LONG TERM GOAL #  1   Title Pt will perform supine rolling R and L at mod I level in order to indicate improved independence with bed mobility.  (Target Date: 06/25/16)/ 08-23-16/10-25-16   Baseline inconsistent in performance - pt needs cues for LE flexion to assist with pushing for rolling and cues for UE positioning   Time 8   Period Weeks   Status On-going   Target Date 10/25/16     PT LONG TERM GOAL #2   Title Pt will perform sit>SL R or L modified independently in order to indicate improved independence with getting into bed.   08-23-16/ 10-25-16   Baseline mod assist on 08-26-16   Time 8   Period Weeks   Status On-going   Target Date 10/25/16     PT LONG TERM GOAL #3   Title Pt will transfer Lt/Rt sidelying to sitting with CGA for incr. independence with getting out of bed.  08-23-16/ 10-25-16   Baseline min to mod assist needed -08-26-16   Time 8   Period Weeks   Status On-going   Target Date 10/25/16     PT LONG TERM GOAL #4   Title Pt will perform basic transfers with min assist to indicate improved functional independence.  08-23-16/ 10-25-16   Baseline mod to max assist - 08-26-16   Time 8   Period Weeks   Status Revised   Target Date 10/25/16     PT LONG TERM GOAL #5   Title Pt will perform static standing with bil. UE support with distant supervision for >/= 10" for improved standing balance.  08-23-16/ 10-25-16   Baseline mod assist for standing with pt unable to tolerate weight bearing on RLE due to c/o pain - 08-26-16   Time 8   Period Weeks   Status New   Target Date 10/25/16     PT LONG TERM GOAL #6   Title Pt will demonstrate ability to ambulate >/= 50' w/ LRAD at min A level in order to assist with getting around home.  CONT LTG til 08-23-16;         NEW TARGET DATE 10-25-16   Baseline RW used to amb. 13' with +2 max assist on 08-26-16   Time 8   Period Weeks   Status Revised     PT LONG TERM  GOAL #7   Title Perform sit to stand and stand to sit to RW or to another object with CGA.  08-23-16/ 10-25-16   Baseline mod assist needed on 08-26-16   Time 8   Period Weeks   Status New           Plan - 09/05/16 1723    Clinical Impression Statement Patient able to barely nod head yes/no when asked re: pain. Instructed to stick out her tongue if she was having pain (and she was able to stick out her tongue when practiced). She denied pain throughout session although in sitting her RUE>LUE was repeatedly flexing/releasing. In standing, her legs repeatedly flexed and extended "bouncing." Dr. Jenetta Loges of town this week, therefore patient has not been able to see him since most recent decline began (did see here PCP earlier this week to rule out UTI. Unable  to attempt ambulation this session due to severity of tone.    Rehab Potential Fair   Clinical Impairments Affecting Rehab Potential Chronicity of condition, continued medical management, but has very strong family and financial support   PT Frequency 2x / week   PT  Duration 8 weeks   PT Treatment/Interventions ADLs/Self Care Home Management;DME Instruction;Gait training;Stair training;Functional mobility training;Therapeutic activities;Patient/family education;Cognitive remediation;Neuromuscular re-education;Therapeutic exercise;Balance training;Orthotic Fit/Training;Wheelchair mobility training;Manual techniques;Splinting;Visual/perceptual remediation/compensation;Vestibular;Passive range of motion;Electrical Stimulation   PT Next Visit Plan cont to work on transfers, gait training and standing balance; can work on trunk control, dynamic sitting if pt unable to perform standing/gait activities   Consulted and Agree with Plan of Care Patient;Family member/caregiver   Family Member Consulted caregiver, [name]              Patient will benefit from skilled therapeutic intervention in order to improve the following deficits and  impairments:  Abnormal gait, Decreased balance, Decreased endurance, Decreased cognition, Cardiopulmonary status limiting activity, Decreased activity tolerance, Decreased coordination, Decreased strength, Impaired flexibility, Impaired tone, Decreased mobility, Increased muscle spasms, Impaired vision/preception, Decreased range of motion, Impaired UE functional use, Postural dysfunction, Improper body mechanics, Impaired perceived functional ability, Decreased knowledge of precautions, Decreased knowledge of use of DME  Visit Diagnosis: Other symptoms and signs involving the nervous system     Problem List Patient Active Problem List   Diagnosis Date Noted  . Malignant neoplasm of upper-inner quadrant of left breast in female, estrogen receptor positive ([facility]) 07/14/2016  . Heterotopic ossification of bone 03/10/2016  . Acute on chronic respiratory failure with hypoxia ([facility])   . Acute hypoxemic respiratory failure ([place] [facility])   . Acute blood loss anemia   . Atelectasis 02/04/2016  . Debility 02/03/2016  . Pneumonia of both lower lobes due to Pseudomonas species ([facility])   . Pneumonia of both lower lobes due to methicillin susceptible Staphylococcus aureus (MSSA) ([facility])   . Acute respiratory failure with hypoxia ([facility])   . Acute encephalopathy   . Seizures ([name])   . Sepsis ([facility])   . Hypoxia   . Pain   . HCAP (healthcare-associated pneumonia) 12/28/2015  . Chronic respiratory failure ([facility]) 12/28/2015  . Acute tracheobronchitis 12/24/2015  . Tracheostomy tube present ([facility])   . Tracheal stenosis   . Chronic respiratory failure with hypoxia ([facility])   . Spastic tetraplegia ([facility]) 10/20/2015  . Tracheostomy status ([facility]) 09/26/2015  . Allergic rhinitis 09/26/2015  . 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 Movement disorder 09/22/2015  . Encephalitis 09/22/2015  . Chest pain 02/07/2014  . Premature atrial contractions 01/13/2014  . Abnormality of gait 12/04/2013  . Hyperlipidemia  12/21/2011  . Cardiovascular risk factor 12/21/2011  . Bunion 01/31/2008  . Metatarsalgia of both feet 09/20/2007  . FLAT FOOT 09/20/2007  . HALLUX RIGIDUS, ACQUIRED 09/20/2007    Jeanie Cooks Jayli Fogleman. PT 09/04/2016, 10:56 AM  [facility] [address] [place], [place], [address] Phone: [phone]   Fax:  [phone]  Name: Judy Spears MRN: [id number] Date of Birth: 1950-08-29

## 2016-09-06 ENCOUNTER — Encounter: Payer: Self-pay | Admitting: Occupational Therapy

## 2016-09-06 ENCOUNTER — Ambulatory Visit: Payer: Medicare Other | Admitting: Occupational Therapy

## 2016-09-06 DIAGNOSIS — R278 Other lack of coordination: Secondary | ICD-10-CM

## 2016-09-06 DIAGNOSIS — M6281 Muscle weakness (generalized): Secondary | ICD-10-CM

## 2016-09-06 DIAGNOSIS — M25612 Stiffness of left shoulder, not elsewhere classified: Secondary | ICD-10-CM

## 2016-09-06 DIAGNOSIS — R2681 Unsteadiness on feet: Secondary | ICD-10-CM

## 2016-09-06 DIAGNOSIS — R2689 Other abnormalities of gait and mobility: Secondary | ICD-10-CM | POA: Diagnosis not present

## 2016-09-06 DIAGNOSIS — R41844 Frontal lobe and executive function deficit: Secondary | ICD-10-CM

## 2016-09-06 DIAGNOSIS — R29818 Other symptoms and signs involving the nervous system: Secondary | ICD-10-CM

## 2016-09-06 DIAGNOSIS — M25611 Stiffness of right shoulder, not elsewhere classified: Secondary | ICD-10-CM

## 2016-09-06 DIAGNOSIS — R293 Abnormal posture: Secondary | ICD-10-CM

## 2016-09-06 DIAGNOSIS — M25622 Stiffness of left elbow, not elsewhere classified: Secondary | ICD-10-CM

## 2016-09-06 NOTE — Therapy (Signed)
[facility] [address] [place], [place], [address] Phone: [phone]   Fax:  [phone]  Occupational Therapy Treatment  Patient Details  Name: Judy Spears MRN: [id number] Date of Birth: Aug 07, 1950 Referring Provider: Dr. Quita Skye. [name]  Encounter Date: 09/06/2016      OT End of Session - 09/06/16 1708    Visit Number 60   Number of Visits 41   Date for OT Re-Evaluation 11/01/16   Authorization Type BCBS unlimited visits   OT Start Time 3545   OT Stop Time 1612   OT Time Calculation (min) 40 min   Activity Tolerance Patient tolerated treatment well      Past Medical History:  Diagnosis Date  . Arthritis    lt great toe  . Complication of anesthesia    pt has had headaches post op-did advise to hydra well preop  . Hair loss    right-sided  . History of exercise stress test    a. ETT (12/15) with 12:00 exercise, no ST changes, occasional PACs.   . Hyperlipidemia   . Insomnia   . Migraine headache   . Movement disorder    resltess in left legs  . Postmenopausal   . 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 Premature atrial contractions    a. Holter (12/15) with 8% PACs, no atrial runs or atrial fibrillation.     Past Surgical History:  Procedure Laterality Date  . BREAST BIOPSY  01/01/2011   Procedure: BREAST BIOPSY WITH NEEDLE LOCALIZATION;  Surgeon: Rolm Bookbinder, MD;  Location: [facility];  Service: General;  Laterality: Left;  left breast wire localization biopsy  . BREAST BIOPSY Right 07/27/2012  . BREAST BIOPSY Right 07/07/2016  . BREAST BIOPSY Left 06/30/2016  . cataracts right eye    . CESAREAN SECTION  1986  . HERNIA REPAIR  2000   RIH  . opirectinal membrane peel    . PEG PLACEMENT    . RHINOPLASTY  1983  . TRACHEOSTOMY      There were no vitals filed for this visit.      Subjective Assessment - 09/06/16 1533    Subjective  Pt with moaning and perseverative counting  today in session   Patient is accompained by: Family member   Pertinent History see epic snapshot - ABI/encephalitis/hypoxia from virus; recent hospitalization due to ARF;    Patient Stated Goals Pt unable to state.    Currently in Pain? No/denies                      OT Treatments/Exercises (OP) - 09/06/16 0001      Neurological Re-education Exercises   Other Exercises 1 Neuro re ed to address foward weigh shift in prep for transfer and sit to stand, finding midline in sitting, sit to stand and static standing followed by very small weight shifts. Pt continues to  display significant extrapyramidal movements (bouncing, flexed knees, non purposeful rapid movement of UE's) in sitting and standing. Pt tolerated approximately 15 seconds when standing today despite numerous attempts and attempts to decrease tone and reduce excessive movement. Pt alternated between eyes open and moaning when standing with closing eyes and remaining predominantly nonresponsive.  Pt was able to verbalize that she now feels "scared" to stand.  Pt needing max assist today for sit to stand, standing and squat pivot transfers (pt was consistently min a for a signficant period of time).  OT Short Term Goals - 09/06/16 1706      OT SHORT TERM GOAL  #9   TITLE Pt and family will be mod I with upgraded home activities program - 08/09/2016   Status Achieved     OT SHORT TERM GOAL  #10   TITLE Pt will be min a stand step toilet transfers at least 50% of the time using either grab bar or walker for community transfers   Status Achieved     OT SHORT TERM GOAL  #11   TITLE Pt will consistently move from sit to stand with no more than min a for functional mobility   Status Achieved     OT SHORT TERM GOAL  #12   TITLE Pt will be no more than mod a consistently for bathing at shower level - 10/04/2016   Status On-going           OT Long Term Goals - 09/06/16 1706      [place] #1   Title Pt, husband and caregiver will be mod I with upgraded HEP prn - 04/26/2016   Status Achieved     OT LONG TERM GOAL #2   Title Pt, husband and caregiver will be mod I with splint wear and care    Status Achieved     OT LONG TERM GOAL #3   Title Pt will be able to wash face with min a only   Status Achieved     OT LONG TERM GOAL #4   Title Pt will be able to sit without UE support with supevision 100% of the time with no more than 2 vc's while engaged in functional activity.   Status Achieved     OT LONG TERM GOAL #5   Title Pt, husband and caregiver will verbalize understanding of potential after care therapeutic opportunities. - 07/08/2016 date adjusted as pt missed 2 weeks due to hospitalization   Status Achieved     OT LONG TERM GOAL #6   Title Pt will be supervision to wash face consistently   Status Achieved     OT LONG TERM GOAL #7   Title Pt will don shirt with min a and max vc's 50% of the time   Status Achieved     OT LONG TERM GOAL #8   Title Pt will require min a for stand step transfers to toilet using grab bar or walker for community toileting at least 75% of the time. - 11/01/2016   Status On-going     OT LONG TERM GOAL  #9   Baseline Pt will stand with contact gurard with only 1 UE support for static standing balance in prep for abiilty to engage in functional task while standing.    Status Achieved     OT LONG TERM GOAL  #10   TITLE Pt will consistenly move from sit to stand with no more than CGA while caregiver assists with pulling up pants.    Status On-going     OT LONG TERM GOAL  #11   TITLE Pt will require no more than close supervision for static standing with bilateral UE while caregiver assists with hiking pants.    Status On-going     OT LONG TERM GOAL  #12   TITLE Pt will go from stand to sit during ADL activities with no more than min a.    Status On-going  Plan - 09/06/16 1707    Clinical Impression  Statement Pt with recent decline over past two weeks - pt continues to have much greater difficulty with functional mobility as well as with alertness and following simple commands.    Rehab Potential Fair   Current Impairments/barriers affecting progress: severity of deficits, very slow rate of recovery; pt recently diagnosed with breast cancer - will monitor ability to tolerate therapy   OT Frequency 2x / week   OT Duration 8 weeks   OT Treatment/Interventions Self-care/ADL training;Ultrasound;Moist Heat;Electrical Stimulation;DME and/or AE instruction;Neuromuscular education;Therapeutic exercise;Functional Mobility Training;Manual Therapy;Passive range of motion;Splinting;Therapeutic activities;Balance training;Patient/family education;Visual/perceptual remediation/compensation;Cognitive remediation/compensation   Plan NMR for trunk, UE's, functional mobility, postural alignment and control, transitional movements as able given current status change.    Consulted and Agree with Plan of Care Patient;Family member/caregiver   Family Member Consulted caregiver Leesa, [employer]suband [name]      Patient will benefit from skilled therapeutic intervention in order to improve the following deficits and impairments:  Decreased endurance, Decreased skin integrity, Impaired vision/preception, Improper body mechanics, Impaired perceived functional ability, Decreased knowledge of precautions, Cardiopulmonary status limiting activity, Decreased activity tolerance, Decreased knowledge of use of DME, Decreased strength, Impaired flexibility, Improper spinal/pelvic alignment, Impaired sensation, Difficulty walking, Decreased mobility, Decreased balance, Decreased cognition, Decreased range of motion, Impaired tone, Impaired UE functional use, Decreased safety awareness, Decreased coordination, Pain  Visit Diagnosis: Other symptoms and signs involving the nervous system  Unsteadiness on feet  Muscle weakness  (generalized)  Abnormal posture  Stiffness of left shoulder, not elsewhere classified  Stiffness of right shoulder, not elsewhere classified  Stiffness of left elbow, not elsewhere classified  Other lack of coordination  Frontal lobe and executive function deficit    Problem List Patient Active Problem List   Diagnosis Date Noted  . Malignant neoplasm of upper-inner quadrant of left breast in female, estrogen receptor positive ([facility]) 07/14/2016  . Heterotopic ossification of bone 03/10/2016  . Acute on chronic respiratory failure with hypoxia ([facility])   . Acute hypoxemic respiratory failure ([facility])   . Acute blood loss anemia   . Atelectasis 02/04/2016  . Debility 02/03/2016  . Pneumonia of both lower lobes due to Pseudomonas species ([facility])   . Pneumonia of both lower lobes due to methicillin susceptible Staphylococcus aureus (MSSA) ([facility])   . Acute respiratory failure with hypoxia ([facility])   . Acute encephalopathy   . Seizures ([facility])   . Sepsis ([place])   . Hypoxia   . Pain   . HCAP (healthcare-associated pneumonia) 12/28/2015  . Chronic respiratory failure ([facility]) 12/28/2015  . Acute tracheobronchitis 12/24/2015  . Tracheostomy tube present ([facility])   . Tracheal stenosis   . Chronic respiratory failure with hypoxia ([facility])   . Spastic tetraplegia ([facility]) 10/20/2015  . Tracheostomy status ([facility]) 09/26/2015  . Allergic rhinitis 09/26/2015  . 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 Movement disorder 09/22/2015  . Encephalitis 09/22/2015  . Chest pain 02/07/2014  . Premature atrial contractions 01/13/2014  . Abnormality of gait 12/04/2013  . Hyperlipidemia 12/21/2011  . Cardiovascular risk factor 12/21/2011  . Bunion 01/31/2008  . Metatarsalgia of both feet 09/20/2007  . FLAT FOOT 09/20/2007  . HALLUX RIGIDUS, ACQUIRED 09/20/2007    Quay Burow, OTR/L 09/06/2016, 5:12 PM  [facility] [address] [place] [address] [place], [place], [address] Phone: [phone]   Fax:  [phone]  Name: DELSA WALDER MRN: [id number] Date of Birth: 11-May-1950

## 2016-09-06 NOTE — Telephone Encounter (Signed)
I called and left VM. Want to see her in office before making any changes. Instructed Mr. Kosa to call and schedule an appt.

## 2016-09-07 ENCOUNTER — Ambulatory Visit: Payer: Medicare Other | Admitting: Physical Therapy

## 2016-09-07 DIAGNOSIS — R2689 Other abnormalities of gait and mobility: Secondary | ICD-10-CM | POA: Diagnosis not present

## 2016-09-07 DIAGNOSIS — R29818 Other symptoms and signs involving the nervous system: Secondary | ICD-10-CM

## 2016-09-07 DIAGNOSIS — R2681 Unsteadiness on feet: Secondary | ICD-10-CM

## 2016-09-08 ENCOUNTER — Other Ambulatory Visit: Payer: Self-pay | Admitting: Physical Medicine & Rehabilitation

## 2016-09-08 ENCOUNTER — Ambulatory Visit
Admission: RE | Admit: 2016-09-08 | Discharge: 2016-09-08 | Disposition: A | Discharge status: Home / Self Care | Payer: Medicare Other | Source: Ambulatory Visit | Attending: Physical Medicine & Rehabilitation | Admitting: Physical Medicine & Rehabilitation

## 2016-09-08 ENCOUNTER — Encounter: Payer: Medicare Other | Attending: Physical Medicine & Rehabilitation | Admitting: Physical Medicine & Rehabilitation

## 2016-09-08 ENCOUNTER — Encounter: Payer: Self-pay | Admitting: Physical Medicine & Rehabilitation

## 2016-09-08 DIAGNOSIS — G4700 Insomnia, unspecified: Secondary | ICD-10-CM | POA: Diagnosis not present

## 2016-09-08 DIAGNOSIS — E785 Hyperlipidemia, unspecified: Secondary | ICD-10-CM | POA: Insufficient documentation

## 2016-09-08 DIAGNOSIS — Z930 Tracheostomy status: Secondary | ICD-10-CM | POA: Diagnosis not present

## 2016-09-08 DIAGNOSIS — A852 Arthropod-borne viral encephalitis, unspecified: Secondary | ICD-10-CM | POA: Diagnosis not present

## 2016-09-08 DIAGNOSIS — G8250 Quadriplegia, unspecified: Secondary | ICD-10-CM | POA: Diagnosis not present

## 2016-09-08 DIAGNOSIS — J209 Acute bronchitis, unspecified: Secondary | ICD-10-CM | POA: Diagnosis not present

## 2016-09-08 DIAGNOSIS — Z8619 Personal history of other infectious and parasitic diseases: Secondary | ICD-10-CM | POA: Diagnosis not present

## 2016-09-08 DIAGNOSIS — R252 Cramp and spasm: Secondary | ICD-10-CM | POA: Insufficient documentation

## 2016-09-08 DIAGNOSIS — I491 Atrial premature depolarization: Secondary | ICD-10-CM | POA: Insufficient documentation

## 2016-09-08 DIAGNOSIS — R5381 Other malaise: Secondary | ICD-10-CM | POA: Insufficient documentation

## 2016-09-08 DIAGNOSIS — Z853 Personal history of malignant neoplasm of breast: Secondary | ICD-10-CM | POA: Diagnosis not present

## 2016-09-08 DIAGNOSIS — Z09 Encounter for follow-up examination after completed treatment for conditions other than malignant neoplasm: Secondary | ICD-10-CM | POA: Insufficient documentation

## 2016-09-08 DIAGNOSIS — G43909 Migraine, unspecified, not intractable, without status migrainosus: Secondary | ICD-10-CM | POA: Diagnosis not present

## 2016-09-08 DIAGNOSIS — J9610 Chronic respiratory failure, unspecified whether with hypoxia or hypercapnia: Secondary | ICD-10-CM | POA: Diagnosis not present

## 2016-09-08 MED ORDER — MOXIFLOXACIN HCL 400 MG PO TABS: 400.0000 mg | ORAL_TABLET | Freq: Every day | ORAL | 0 refills | Status: DC

## 2016-09-08 MED ORDER — AMANTADINE HCL 50 MG/5ML PO SYRP: 100.0000 mg | ORAL_SOLUTION | Freq: Two times a day (BID) | ORAL | 0 refills | Status: DC

## 2016-09-08 NOTE — Progress Notes (Signed)
Subjective:    Patient ID: Judy Spears, female    DOB: [birth year], 66 y.o.   MRN: [id number]  HPI   Judy Spears is here in follow up of her viral encephalitis and associated functional deficits. She was in the hospital again this spring with pneumonia and was treated for breast cancer this summer in [place] where she had a lumpectomy. No radiation was given, but she was started on arimidex as adjuvant therapy. Her husband/caregivers noted a decline at the end of July, and the thought was that the arimidex was contributing to the presentation. She has been more lethargic, less engaged. More abnormal motor movements have been noticed as well which are impacting purposeful movement. Arimidex has been stopped, but no improvement was seen. She saw Dr. Zenovia Jordan her primary who checked a urine culture---this apparently was normal. Caregiver notes that she has had a low grade temp recently however. Over the last week in particular, she has developed increased secretions which have been green/thick. Judy Spears has often been unable to clear them. Judy Spears has not followed up with [facility] since earlier in the summer.     Pain Inventory Average Pain 7 Pain Right Now 0 My pain is aching  In the last 24 hours, has pain interfered with the following? General activity 7 Relation with others 7 Enjoyment of life 7 What TIME of day is your pain at its worst? daytime Sleep (in general) Fair  Pain is worse with: walking, standing and some activites Pain improves with: rest, heat/ice and medication Relief from Meds: 6  Mobility ability to climb steps?  no do you drive?  no use a wheelchair needs help with transfers  Function retired I need assistance with the following:  feeding, dressing, bathing, toileting and meal prep  Neuro/Psych trouble walking spasms confusion  Prior Studies x-rays  Physicians involved in your care oncologist  dr Janan Halter   Family History  Problem Relation Age of  Onset  . Stroke Mother        paralyzed on right side/aphasia  . Hypertension Mother        had stroke after stopping BP meds  . Colon cancer Father   . Prostate cancer Father   . Heart attack Maternal Grandfather 56  . Stroke Maternal Uncle 89   Social History   Social History  . Marital status: Married    Spouse name: N/A  . Number of children: 3  . Years of education: N/A   Social History Main Topics  . Smoking status: Never Smoker  . Smokeless tobacco: Never Used  . Alcohol use 0.0 oz/week     Comment: Occasional.  . Drug use: No  . Sexual activity: Yes    Birth control/ protection: Post-menopausal   Other Topics Concern  . Not on file   Social History Narrative  . No narrative on file   Past Surgical History:  Procedure Laterality Date  . BREAST BIOPSY  01/01/2011   Procedure: BREAST BIOPSY WITH NEEDLE LOCALIZATION;  Surgeon: Rolm Bookbinder, MD;  Location: [facility];  Service: General;  Laterality: Left;  left breast wire localization biopsy  . BREAST BIOPSY Right 07/27/2012  . BREAST BIOPSY Right 07/07/2016  . BREAST BIOPSY Left 06/30/2016  . cataracts right eye    . CESAREAN SECTION  1986  . HERNIA REPAIR  2000   RIH  . opirectinal membrane peel    . PEG PLACEMENT    . RHINOPLASTY  1983  .  TRACHEOSTOMY     Past Medical History:  Diagnosis Date  . Arthritis    lt great toe  . Complication of anesthesia    pt has had headaches post op-did advise to hydra well preop  . Hair loss    right-sided  . History of exercise stress test    a. ETT (12/15) with 12:00 exercise, no ST changes, occasional PACs.   . Hyperlipidemia   . Insomnia   . Migraine headache   . Movement disorder    resltess in left legs  . Postmenopausal   . 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 Premature atrial contractions    a. Holter (12/15) with 8% PACs, no atrial runs or atrial fibrillation.    LMP 12/01/2010   Opioid Risk Score:   Fall Risk Score:  `1  Depression  screen PHQ 2/9  Depression screen PHQ 2/9 07/14/2016  Decreased Interest 0  Down, Depressed, Hopeless 1  PHQ - 2 Score 1  Some recent data might be hidden     Review of Systems  Constitutional: Positive for diaphoresis and unexpected weight change.  HENT: Negative.   Eyes: Negative.   Respiratory: Positive for cough.   Cardiovascular: Negative.   Gastrointestinal: Negative.   Endocrine: Negative.   Genitourinary: Negative.   Musculoskeletal: Negative.   Skin: Negative.   Allergic/Immunologic: Negative.   Neurological: Negative.   Hematological: Negative.   Psychiatric/Behavioral: Negative.   All other systems reviewed and are negative.      Objective:   Physical Exam  Constitutional: She appears well-developed. NAD. HENT: NCAT  Eyes: No discharge, EOM appear intact Neck: #4 trach with Plug. No drainage from trach Cardiac: sl tachy Respiratory: no wheezes or rhonchi, decreased sounds at bases. Fair air movement.  GI: BS+, NT, ND.  PEG site intact. Musculoskeletal: She exhibits no edema, no tenderness.  Neurological: Generally alert and making good eye contact but falls asleep easily.  MAS: LUE and LLE. LUE 1-2/4. LLE now 1-2/4.  -elbow with PROM at130-135--I feel that there is a hard stop of sorts at this point -knee is fully extended today           -tr to 1-2/4 tone in the LE's including hamstrings, hip adductors, gastrocs.            -numerous involuntary movements todaywith dyskinesias, pill rolling tremor.   Motor:   3/5 RUE and RLE grossly 2-3/5 LUE and LLE--left upper remains limited by the contractures Skin: Skin is warmand dry. did not examine buttocks Psychiatric: alert and interactive today       Assessment & Plan:  1. Functional, mobility and cognitive/communication deficitssecondary to debility in setting of prior encephalopathy/tetraplegia  -recent functional decline over the last 2+ weeks. She has been  more lethargic with an increase of extrapyramidal symptoms. I suspect there is an infectious etiology to this abrupt clinical change. -continue at neuro-rehab as tolerated to maintain ROM and preserve functional mobility             -see plan regarding lethargy below 2. Pain Management: tylenol prn 3. Fluids/Electrolytes/Nutrition: TF to continue until adequately able to take in PO 4. Spasticity: On baclofen qid and valium only 2mg  qhs  -continue left elbow splinting as possible           -therapy           -will arrange for follow up botox to bilateral hamstrings/?gastrocs/adductors/left bicep for next month  5. 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 Arousal: reduce valium to 1mg  at bedtime  -will reduce amantadine to 100mg  BID. If dyskinesias worsen, consider return to 150mg   Thirty minutes of face to face patient care time were spent during this visit. All questions were encouraged and answered. Follow up in about a month for botox. Greater than 50% of time during this encounter was spent counseling patient/family in regard to respiratory issues, med review, spasticity assessment.

## 2016-09-08 NOTE — Therapy (Signed)
[facility] [address] [place], [place], [address] Phone: [phone]   Fax:  [phone]  Physical Therapy Treatment  Patient Details  Name: Judy Spears MRN: [id number] Date of Birth: [birth year] Referring Provider: Alger Simons, MD/Andrew Letta Pate, MD  Encounter Date: 09/07/2016      PT End of Session - 09/08/16 0926    Visit Number 50   Number of Visits 103   Date for PT Re-Evaluation 10/25/16   Authorization Type BC[employer]   PT Start Time 1446   PT Stop Time 1531   PT Time Calculation (min) 45 min      Past Medical History:  Diagnosis Date  . Arthritis    lt great toe  . Complication of anesthesia    pt has had headaches post op-did advise to hydra well preop  . Hair loss    right-sided  . History of exercise stress test    a. ETT (12/15) with 12:00 exercise, no ST changes, occasional PACs.   . Hyperlipidemia   . Insomnia   . Migraine headache   . Movement disorder    resltess in left legs  . Postmenopausal   . 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 Premature atrial contractions    a. Holter (12/15) with 8% PACs, no atrial runs or atrial fibrillation.     Past Surgical History:  Procedure Laterality Date  . BREAST BIOPSY  01/01/2011   Procedure: BREAST BIOPSY WITH NEEDLE LOCALIZATION;  Surgeon: Rolm Bookbinder, MD;  Location: [facility];  Service: General;  Laterality: Left;  left breast wire localization biopsy  . BREAST BIOPSY Right 07/27/2012  . BREAST BIOPSY Right 07/07/2016  . BREAST BIOPSY Left 06/30/2016  . cataracts right eye    . CESAREAN SECTION  1986  . HERNIA REPAIR  2000   RIH  . opirectinal membrane peel    . PEG PLACEMENT    . RHINOPLASTY  1983  . TRACHEOSTOMY      There were no vitals filed for this visit.      Subjective Assessment - 09/08/16 0919    Subjective Leesa (caregiver) reports that they now have an appt on Wed. with Dr. [name] (thanks to [facility] email to  their office) regarding pt's significant decline: states pt continues to be lethargic/sleepy; has been trying to continue to work on sit to stand transfers at home but legs are very "bouncy"   Pertinent History Precautions:  PEG.  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  Another round of botox in LLE on 02/06/16   Patient Stated Goals Per husband, "For [place] to be as independent as possible."   Currently in Pain? No/denies                         [facility] Adult PT Treatment/Exercise - 09/08/16 0001      Transfers   Transfers Sit to Stand;Stand to Sit;Stand Pivot Transfers   Sit to Stand 2: Max assist   Sit to Stand Details Manual facilitation for weight shifting;Verbal cues for sequencing;Tactile cues for initiation;Tactile cues for placement;Verbal cues for technique   Stand to Sit 3: Mod assist   Stand to Sit Details (indicate cue type and reason) Tactile cues for initiation;Tactile cues for sequencing;Tactile cues for weight shifting;Tactile cues for posture   Stand  to Sit Details cues to flex trunk and hips for controlled descent   Stand Pivot Transfers 2: Max assist   Squat Pivot Transfers 1: +1 Total assist     Knee/Hip Exercises: Aerobic   Recumbent Bike SciFit Level 2.0 with LE's only - performed from wheelchair (physically blocked wheelchair from behind to prevent chair from tipping  backwards)     NeuroRe-ed:  Sitting balance activity with assistance for lateral and anterior/posterior weight shifting performed on edge of mat Pt performed sit to Rt sidesitting (propped on Rt forearm with mod assist to assume position) pt able to maintain position with CGA  SIt to Lt sidesitting (propped on Lt forearm with mod assist to assume position; position held for approx. 30 secs on Rt and Lt  sides  Performed sit to stand from mat - pt's hands placed on PT's shoulders - tactile cues given to try to decrease "bouncing" of LE's in standing  Pt transferred wheelchair to stand inside // bars with mod assist; "bouncing" of LE's continued:  Pt's hands initially on // bars; on 2nd and 3rd rep Pt's hands placed on PT's shoulders due to grasp reflex with pt having difficulty with release of grasp  Pt unable to step with either RLE/LLE   (Reviewed List of changes in status comprised by Forde Radon, OTR to give to Dr. [name] at appt. On Wed., 09-08-16)        PT Education - 09/08/16 0086    Education provided Yes          PT Short Term Goals - 08/26/16 2011      PT SHORT TERM GOAL #1   Title Perform sit to/from stand from wheelchair or mat with CGA.  09-25-16   Baseline mod assist on 08-26-16   Time 4   Period Weeks   Status New   Target Date 09/25/16     PT SHORT TERM GOAL #2   Title Pt will perform sit to stand from wheelchair to RW with CGA.  07-24-16;  NEW TARGET DATE  09-25-16   Baseline min/guard to min A 07/22/16; min to mod assist on 08-26-16   Time 4   Period Weeks   Status On-going   Target Date 09/25/16     PT SHORT TERM GOAL #3   Title Pt will stand for at least 5" at home with UE support with CGA for RLE and to demo decreased pain in RLE with weight bearing.   09-25-16   Time 4   Period Weeks   Status New   Target Date 09/25/16     PT SHORT TERM GOAL #4   Title Family will report pt rolling from supine to Rt and Lt sides in hospital bed at home with supervision without use of bed rail with cues for positioning of LE's.  07-24-16/  NEW TARGET DATE 09-25-16   Baseline pt needs min assist at times - activity just demonstrated by pt on 06-24-16   Time 4   Status On-going   Target Date 09/25/16     PT SHORT TERM GOAL #5   Title Basic transfers from wheelchair to bed or commode with +1 min assist.  NEW TARGET DATE 09-25-16   Baseline min A 07/22/16 with RW; mod to max  assist on 08-26-16   Time 4   Period Weeks   Status New   Target Date 09/25/16     PT SHORT TERM GOAL #6   Title Amb. 24' with RW with +1  min assist.  09-26-16   Baseline 13" with RW with +2 max asssit - 08-26-16   Time 4   Period Weeks   Status New           PT Long Term Goals - 08/26/16 2021      PT LONG TERM GOAL #1   Title Pt will perform supine rolling R and L at mod I level in order to indicate improved independence with bed mobility.  (Target Date: 06/25/16)/ 08-23-16/10-25-16   Baseline inconsistent in performance - pt needs cues for LE flexion to assist with pushing for rolling and cues for UE positioning   Time 8   Period Weeks   Status On-going   Target Date 10/25/16     PT LONG TERM GOAL #2   Title Pt will perform sit>SL R or L modified independently in order to indicate improved independence with getting into bed.   08-23-16/ 10-25-16   Baseline mod assist on 08-26-16   Time 8   Period Weeks   Status On-going   Target Date 10/25/16     PT LONG TERM GOAL #3   Title Pt will transfer Lt/Rt sidelying to sitting with CGA for incr. independence with getting out of bed.  08-23-16/ 10-25-16   Baseline min to mod assist needed -08-26-16   Time 8   Period Weeks   Status On-going   Target Date 10/25/16     PT LONG TERM GOAL #4   Title Pt will perform basic transfers with min assist to indicate improved functional independence.  08-23-16/ 10-25-16   Baseline mod to max assist - 08-26-16   Time 8   Period Weeks   Status Revised   Target Date 10/25/16     PT LONG TERM GOAL #5   Title Pt will perform static standing with bil. UE support with distant supervision for >/= 10" for improved standing balance.  08-23-16/ 10-25-16   Baseline mod assist for standing with pt unable to tolerate weight bearing on RLE due to c/o pain - 08-26-16   Time 8   Period Weeks   Status New   Target Date 10/25/16     PT LONG TERM GOAL #6   Title Pt will demonstrate ability to ambulate >/= 50' w/ LRAD at  min A level in order to assist with getting around home.  CONT LTG til 08-23-16;         NEW TARGET DATE 10-25-16   Baseline RW used to amb. 13' with +2 max assist on 08-26-16   Time 8   Period Weeks   Status Revised     PT LONG TERM GOAL #7   Title Perform sit to stand and stand to sit to RW or to another object with CGA.  08-23-16/ 10-25-16   Baseline mod assist needed on 08-26-16   Time 8   Period Weeks   Status New               Plan - 09/08/16 5188    Clinical Impression Statement Summary of pt's changes in status (physical, cognitive) was written by Forde Radon, OTR and reviewed by myself Vinnie Level Clark Clowdus, Pt) to inform Dr. [name] at pt's appt on Wed. of significance of decline in status in multiple areas of function:  pt continues to have bouncing in LE's with standing and pt unable to initiate a step with either RLE/LLE;  pt continues to have increased tone thorughout body with perseverative movements.  Pt is becoming more  dependent with transfers.   Rehab Potential Fair   Clinical Impairments Affecting Rehab Potential Chronicity of condition, continued medical management, but has very strong family and financial support   PT Frequency 2x / week   PT Duration 8 weeks   PT Treatment/Interventions ADLs/Self Care Home Management;DME Instruction;Gait training;Stair training;Functional mobility training;Therapeutic activities;Patient/family education;Cognitive remediation;Neuromuscular re-education;Therapeutic exercise;Balance training;Orthotic Fit/Training;Wheelchair mobility training;Manual techniques;Splinting;Visual/perceptual remediation/compensation;Vestibular;Passive range of motion;Electrical Stimulation   PT Next Visit Plan Raquel Sarna (since you see her at 4:15) - work on bed mobiity, stretching LE's as tolerated for tone reduction, sitting balance/ trunk control [name] says she will work on Training and development officer and standing)   Consulted and Agree with Plan of Care Patient;Family  member/caregiver   Family Member Consulted caregiver, [name]       Patient will benefit from skilled therapeutic intervention in order to improve the following deficits and impairments:  Abnormal gait, Decreased balance, Decreased endurance, Decreased cognition, Cardiopulmonary status limiting activity, Decreased activity tolerance, Decreased coordination, Decreased strength, Impaired flexibility, Impaired tone, Decreased mobility, Increased muscle spasms, Impaired vision/preception, Decreased range of motion, Impaired UE functional use, Postural dysfunction, Improper body mechanics, Impaired perceived functional ability, Decreased knowledge of precautions, Decreased knowledge of use of DME  Visit Diagnosis: Other symptoms and signs involving the nervous system  Unsteadiness on feet  Other abnormalities of gait and mobility     Problem List Patient Active Problem List   Diagnosis Date Noted  . Malignant neoplasm of upper-inner quadrant of left breast in female, estrogen receptor positive ([place]) 07/14/2016  . Heterotopic ossification of bone 03/10/2016  . Acute on chronic respiratory failure with hypoxia ([place])   . Acute hypoxemic respiratory failure ([place])   . Acute blood loss anemia   . Atelectasis 02/04/2016  . Debility 02/03/2016  . Pneumonia of both lower lobes due to Pseudomonas species ([place])   . Pneumonia of both lower lobes due to methicillin susceptible Staphylococcus aureus (MSSA) ([place])   . Acute respiratory failure with hypoxia ([place])   . Acute encephalopathy   . Seizures ([place])   . Sepsis ([place])   . Hypoxia   . Pain   . HCAP (healthcare-associated pneumonia) 12/28/2015  . Chronic respiratory failure ([place]) 12/28/2015  . Acute tracheobronchitis 12/24/2015  . Tracheostomy tube present ([place])   . Tracheal stenosis   . Chronic respiratory failure with hypoxia ([place])   . 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 Tracheostomy status ([place]) 09/26/2015  . Allergic rhinitis 09/26/2015   . 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 Movement disorder 09/22/2015  . Encephalitis 09/22/2015  . Chest pain 02/07/2014  . Premature atrial contractions 01/13/2014  . Abnormality of gait 12/04/2013  . Hyperlipidemia 12/21/2011  . Cardiovascular risk factor 12/21/2011  . Bunion 01/31/2008  . Metatarsalgia of both feet 09/20/2007  . FLAT FOOT 09/20/2007  . HALLUX RIGIDUS, ACQUIRED 09/20/2007    Alda Lea, PT 09/08/2016, 9:36 AM  [facility] [address] [place], [place], [address] Phone: [phone]   Fax:  [phone]  Name: Judy Spears MRN: [id number] Date of Birth: 07-Sep-1950

## 2016-09-08 NOTE — Patient Instructions (Signed)
PLEASE FEEL FREE TO CALL OUR OFFICE WITH ANY PROBLEMS OR QUESTIONS ([phone])      

## 2016-09-09 ENCOUNTER — Ambulatory Visit: Payer: Medicare Other | Admitting: Rehabilitation

## 2016-09-09 ENCOUNTER — Encounter: Payer: Self-pay | Admitting: Occupational Therapy

## 2016-09-09 ENCOUNTER — Ambulatory Visit: Payer: Medicare Other | Admitting: Occupational Therapy

## 2016-09-09 DIAGNOSIS — M25612 Stiffness of left shoulder, not elsewhere classified: Secondary | ICD-10-CM

## 2016-09-09 DIAGNOSIS — R278 Other lack of coordination: Secondary | ICD-10-CM

## 2016-09-09 DIAGNOSIS — R41844 Frontal lobe and executive function deficit: Secondary | ICD-10-CM

## 2016-09-09 DIAGNOSIS — R2681 Unsteadiness on feet: Secondary | ICD-10-CM

## 2016-09-09 DIAGNOSIS — M25622 Stiffness of left elbow, not elsewhere classified: Secondary | ICD-10-CM

## 2016-09-09 DIAGNOSIS — R29818 Other symptoms and signs involving the nervous system: Secondary | ICD-10-CM

## 2016-09-09 DIAGNOSIS — M25611 Stiffness of right shoulder, not elsewhere classified: Secondary | ICD-10-CM

## 2016-09-09 DIAGNOSIS — M6281 Muscle weakness (generalized): Secondary | ICD-10-CM

## 2016-09-09 DIAGNOSIS — R293 Abnormal posture: Secondary | ICD-10-CM

## 2016-09-09 DIAGNOSIS — R2689 Other abnormalities of gait and mobility: Secondary | ICD-10-CM | POA: Diagnosis not present

## 2016-09-09 LAB — COMPREHENSIVE METABOLIC PANEL
ALT: 45 IU/L — AB (ref 0–32)
AST: 36 IU/L (ref 0–40)
Albumin/Globulin Ratio: 1.4 (ref 1.2–2.2)
Albumin: 4.1 g/dL (ref 3.6–4.8)
Alkaline Phosphatase: 187 IU/L — ABNORMAL HIGH (ref 39–117)
BUN / CREAT RATIO: 24 (ref 12–28)
BUN: 10 mg/dL (ref 8–27)
Bilirubin Total: 0.7 mg/dL (ref 0.0–1.2)
CALCIUM: 9.4 mg/dL (ref 8.7–10.3)
CO2: 24 mmol/L (ref 20–29)
CREATININE: 0.41 mg/dL — AB (ref 0.57–1.00)
Chloride: 92 mmol/L — ABNORMAL LOW (ref 96–106)
GFR, EST AFRICAN AMERICAN: 125 mL/min/{1.73_m2} (ref >59)
GFR, EST NON AFRICAN AMERICAN: 109 mL/min/{1.73_m2} (ref >59)
Globulin, Total: 2.9 g/dL (ref 1.5–4.5)
Glucose: 85 mg/dL (ref 65–99)
POTASSIUM: 3.8 mmol/L (ref 3.5–5.2)
SODIUM: 134 mmol/L (ref 134–144)
Total Protein: 7 g/dL (ref 6.0–8.5)

## 2016-09-09 LAB — CBC
HEMATOCRIT: 40.7 % (ref 34.0–46.6)
Hemoglobin: 13.1 g/dL (ref 11.1–15.9)
MCH: 29.1 pg (ref 26.6–33.0)
MCHC: 32.2 g/dL (ref 31.5–35.7)
MCV: 90 fL (ref 79–97)
PLATELETS: 363 10*3/uL (ref 150–379)
RBC: 4.5 x10E6/uL (ref 3.77–5.28)
RDW: 13.5 % (ref 12.3–15.4)
WBC: 8.4 10*3/uL (ref 3.4–10.8)

## 2016-09-09 NOTE — Telephone Encounter (Signed)
I spoke to Mr. Scheibe yesterday about [name]'s CXR. Could you pass along to him today that the labwork looks reasonable so far. There are some mild elevations of her LFT's we'll have to recheck at a later date, but nothing I'm overly concerned about at present. (may be related to valtrex)----Sputum culture is still pending.   thx

## 2016-09-09 NOTE — Telephone Encounter (Signed)
Contacted Judy Spears and passed on Dr. Charm Barges message.  He was appreciative................ Side note: this conversation was added on to another clinics conversation thread. It originated from the cancer center.

## 2016-09-09 NOTE — Therapy (Signed)
[facility] [address] [place], [place], [address] Phone: [phone]   Fax:  [phone]  Occupational Therapy Treatment  Patient Details  Name: Judy Spears MRN: [id number] Date of Birth: [birth year] Referring Provider: Dr. Quita Skye. [name]  Encounter Date: 09/09/2016      OT End of Session - 09/09/16 1643    Visit Number 50   Number of Visits 27   Date for OT Re-Evaluation 11/01/16   Authorization Type [employer] unlimited visits   OT Start Time 1324   OT Stop Time 1613   OT Time Calculation (min) 41 min      Past Medical History:  Diagnosis Date  . Arthritis    lt great toe  . Complication of anesthesia    pt has had headaches post op-did advise to hydra well preop  . Hair loss    right-sided  . History of exercise stress test    a. ETT (12/15) with 12:00 exercise, no ST changes, occasional PACs.   . Hyperlipidemia   . Insomnia   . Migraine headache   . Movement disorder    resltess in left legs  . Postmenopausal   . 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 Premature atrial contractions    a. Holter (12/15) with 8% PACs, no atrial runs or atrial fibrillation.     Past Surgical History:  Procedure Laterality Date  . BREAST BIOPSY  01/01/2011   Procedure: BREAST BIOPSY WITH NEEDLE LOCALIZATION;  Surgeon: Rolm Bookbinder, MD;  Location: [facility];  Service: General;  Laterality: Left;  left breast wire localization biopsy  . BREAST BIOPSY Right 07/27/2012  . BREAST BIOPSY Right 07/07/2016  . BREAST BIOPSY Left 06/30/2016  . cataracts right eye    . CESAREAN SECTION  1986  . HERNIA REPAIR  2000   RIH  . opirectinal membrane peel    . PEG PLACEMENT    . RHINOPLASTY  1983  . TRACHEOSTOMY      There were no vitals filed for this visit.      Subjective Assessment - 09/09/16 1540    Subjective  It hurts ( legs)   Patient is accompained by: Family member  husband Judy Spears and caregiver Judy Spears   Pertinent History see epic snapshot - ABI/encephalitis/hypoxia from virus; recent hospitalization due to ARF;    Patient Stated Goals Pt unable to state.    Currently in Pain? Yes   Pain Score 9    Pain Location Leg  backs of thighs bilaterally   Pain Orientation Right;Left   Pain Descriptors / Indicators Grimacing;Guarding;Tightness   Pain Type Acute pain   Pain Radiating Towards c/o severe pain in back of thighs just sitting today, and then also when standing   Pain Onset 1 to 4 weeks ago   Pain Frequency Intermittent   Aggravating Factors  today present and severe when she arrived and was just sitting.  Standing also created pain.    Pain Relieving Factors tone reduction techniques for trunk, LE's                      OT Treatments/Exercises (OP) - 09/09/16 0001      Neurological Re-education Exercises   Other Exercises 1 Pt arrived today in sitting in extension pattern in hips, LE's - also presenting with severe adduction LE's. Pt c/o of severe pain back of thighs in sitting (9/10)- feel this was due to posture in chair which encouraged excessive tone. Neuro re  ed to address overall tone reduction including forward bending with fowrard weight shift for hip flexion and to load LE's in sitting, slow rocking, abduction of LE"s, manually increasing weightbearing and proprioceptive input of LE's in sitting, trunk rotation with reciprocal UE movement.  All techniques shown to caregiver and husband.  Transitioned into sit to stand - pt requiring max assist and able to tolerate approximately 15-20 seconds. Pt moaning and with increased extrapyramidal movements with standing.  Attempted small weight shifts however pt unable to tolerate and stated pain in thighs gets worse.  Pt required max a for squat pivot transfers.  By end of session pain 0/10 in sitting.                   OT Short Term Goals - 09/09/16 1639      OT SHORT TERM GOAL  #9   TITLE Pt and family will be  mod I with upgraded home activities program - 08/09/2016   Status Achieved     OT SHORT TERM GOAL  #10   TITLE Pt will be min a stand step toilet transfers at least 50% of the time using either grab bar or walker for community transfers   Status Achieved     OT SHORT TERM GOAL  #11   TITLE Pt will consistently move from sit to stand with no more than min a for functional mobility   Status Achieved     OT SHORT TERM GOAL  #12   TITLE Pt will be no more than mod a consistently for bathing at shower level - 10/04/2016   Status On-going           OT Long Term Goals - 09/09/16 1639      OT LONG TERM GOAL #1   Title Pt, husband and caregiver will be mod I with upgraded HEP prn - 04/26/2016   Status Achieved     OT LONG TERM GOAL #2   Title Pt, husband and caregiver will be mod I with splint wear and care    Status Achieved     OT LONG TERM GOAL #3   Title Pt will be able to wash face with min a only   Status Achieved     OT LONG TERM GOAL #4   Title Pt will be able to sit without UE support with supevision 100% of the time with no more than 2 vc's while engaged in functional activity.   Status Achieved     OT LONG TERM GOAL #5   Title Pt, husband and caregiver will verbalize understanding of potential after care therapeutic opportunities. - 07/08/2016 date adjusted as pt missed 2 weeks due to hospitalization   Status Achieved     OT LONG TERM GOAL #6   Title Pt will be supervision to wash face consistently   Status Achieved     OT LONG TERM GOAL #7   Title Pt will don shirt with min a and max vc's 50% of the time   Status Achieved     OT LONG TERM GOAL #8   Title Pt will require min a for stand step transfers to toilet using grab bar or walker for community toileting at least 75% of the time. - 11/01/2016   Status On-going     OT LONG TERM GOAL  #9   Baseline Pt will stand with contact gurard with only 1 UE support for static standing balance in prep for abiilty to engage  in  functional task while standing.    Status Achieved     OT LONG TERM GOAL  #10   TITLE Pt will consistenly move from sit to stand with no more than CGA while caregiver assists with pulling up pants.    Status On-going     OT LONG TERM GOAL  #11   TITLE Pt will require no more than close supervision for static standing with bilateral UE while caregiver assists with hiking pants.    Status On-going     OT LONG TERM GOAL  #12   TITLE Pt will go from stand to sit during ADL activities with no more than min a.    Status On-going               Plan - 09/09/16 1639    Clinical Impression Statement Pt with severe pain in LE's at beginnng of session however by end of session pain 0/10 in sitting (remained 9/10 in standing). Pt was started on an antibiotic by MD   Rehab Potential Fair   Current Impairments/barriers affecting progress: severity of deficits, very slow rate of recovery; pt with recent significant decline over past 2+ weeks    OT Frequency 2x / week   OT Duration 8 weeks   OT Treatment/Interventions Self-care/ADL training;Ultrasound;Moist Heat;Electrical Stimulation;DME and/or AE instruction;Neuromuscular education;Therapeutic exercise;Functional Mobility Training;Manual Therapy;Passive range of motion;Splinting;Therapeutic activities;Balance training;Patient/family education;Visual/perceptual remediation/compensation;Cognitive remediation/compensation   Plan NMR for trunk, UE's, functional mobility, postural alignment and control, transitional movements as able given current status change.    Consulted and Agree with Plan of Care Patient;Family member/caregiver   Family Member Consulted caregiver Judy Spears, hsuband Judy Spears      Patient will benefit from skilled therapeutic intervention in order to improve the following deficits and impairments:  Decreased endurance, Decreased skin integrity, Impaired vision/preception, Improper body mechanics, Impaired perceived functional  ability, Decreased knowledge of precautions, Cardiopulmonary status limiting activity, Decreased activity tolerance, Decreased knowledge of use of DME, Decreased strength, Impaired flexibility, Improper spinal/pelvic alignment, Impaired sensation, Difficulty walking, Decreased mobility, Decreased balance, Decreased cognition, Decreased range of motion, Impaired tone, Impaired UE functional use, Decreased safety awareness, Decreased coordination, Pain  Visit Diagnosis: Other symptoms and signs involving the nervous system  Unsteadiness on feet  Muscle weakness (generalized)  Abnormal posture  Stiffness of left shoulder, not elsewhere classified  Stiffness of right shoulder, not elsewhere classified  Stiffness of left elbow, not elsewhere classified  Other lack of coordination  Frontal lobe and executive function deficit    Problem List Patient Active Problem List   Diagnosis Date Noted  . Malignant neoplasm of upper-inner quadrant of left breast in female, estrogen receptor positive ([facility]) 07/14/2016  . Heterotopic ossification of bone 03/10/2016  . Acute on chronic respiratory failure with hypoxia ([facility])   . Acute hypoxemic respiratory failure ([facility])   . Acute blood loss anemia   . Atelectasis 02/04/2016  . Debility 02/03/2016  . Pneumonia of both lower lobes due to Pseudomonas species ([facility])   . Pneumonia of both lower lobes due to methicillin susceptible Staphylococcus aureus (MSSA) ([facility])   . Acute respiratory failure with hypoxia ([place])   . Acute encephalopathy   . Seizures ([facility] [place])   . Sepsis ([place])   . Hypoxia   . Pain   . HCAP (healthcare-associated pneumonia) 12/28/2015  . Chronic respiratory failure ([facility]) 12/28/2015  . Acute tracheobronchitis 12/24/2015  . Tracheostomy tube present ([facility])   . Tracheal stenosis   . Chronic respiratory failure with hypoxia ([place])   .  Spastic tetraplegia ([facility]) 10/20/2015  . Tracheostomy status ([place]) 09/26/2015  . Allergic rhinitis  09/26/2015  . 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 Movement disorder 09/22/2015  . Encephalitis 09/22/2015  . Chest pain 02/07/2014  . Premature atrial contractions 01/13/2014  . Abnormality of gait 12/04/2013  . Hyperlipidemia 12/21/2011  . Cardiovascular risk factor 12/21/2011  . Bunion 01/31/2008  . Metatarsalgia of both feet 09/20/2007  . FLAT FOOT 09/20/2007  . HALLUX RIGIDUS, ACQUIRED 09/20/2007    Quay Burow, OTR/L 09/09/2016, 4:45 PM  [facility] [address] [place], [place], [address] Phone: [phone]   Fax:  [phone]  Name: Judy Spears MRN: [id number] Date of Birth: 1950-03-04

## 2016-09-10 ENCOUNTER — Telehealth: Payer: Self-pay | Admitting: *Deleted

## 2016-09-10 ENCOUNTER — Telehealth: Payer: Self-pay

## 2016-09-10 NOTE — Telephone Encounter (Signed)
ERROR

## 2016-09-10 NOTE — Telephone Encounter (Signed)
Mr Altieri called and requested that [name]'s appt be moved up from 10/13/16 to anytime between 09/20/16 and that date.  It is for Botox and may need lab recheck. [facility] office notified.

## 2016-09-11 LAB — SPUTUM CULTURE

## 2016-09-11 LAB — GRAM STAIN W/SPUTUM CULT RFLX

## 2016-09-11 LAB — PLEASE NOTE

## 2016-09-13 NOTE — Telephone Encounter (Signed)
Mr killmer says they did collect sputum.

## 2016-09-13 NOTE — Telephone Encounter (Signed)
Speaking of the sputum sample. I don't see that it is even pending. Did the lab ever collect a sample?

## 2016-09-15 ENCOUNTER — Ambulatory Visit: Payer: Medicare Other | Admitting: Physical Medicine & Rehabilitation

## 2016-09-15 ENCOUNTER — Ambulatory Visit: Payer: Medicare Other | Admitting: Oncology

## 2016-09-20 ENCOUNTER — Encounter: Payer: Self-pay | Admitting: Physical Medicine & Rehabilitation

## 2016-09-20 ENCOUNTER — Encounter: Payer: Medicare Other | Admitting: Occupational Therapy

## 2016-09-20 ENCOUNTER — Encounter (HOSPITAL_BASED_OUTPATIENT_CLINIC_OR_DEPARTMENT_OTHER): Payer: Medicare Other | Admitting: Physical Medicine & Rehabilitation

## 2016-09-20 VITALS — BP 117/73 | HR 82 | Resp 14

## 2016-09-20 DIAGNOSIS — G8250 Quadriplegia, unspecified: Secondary | ICD-10-CM | POA: Diagnosis not present

## 2016-09-20 DIAGNOSIS — Z09 Encounter for follow-up examination after completed treatment for conditions other than malignant neoplasm: Secondary | ICD-10-CM | POA: Diagnosis not present

## 2016-09-20 MED ORDER — GABAPENTIN 100 MG PO CAPS: 100.0000 mg | ORAL_CAPSULE | Freq: Three times a day (TID) | ORAL | 3 refills | Status: DC

## 2016-09-20 NOTE — Progress Notes (Signed)
Botox Injection for spasticity using needle EMG guidance Indication: Spastic tetraplegia (HCC)   Dilution: 100 Units/ml        Total Units Injected: 400 units over both lower extremities Indication: Severe spasticity which interferes with ADL,mobility and/or  hygiene and is unresponsive to medication management and other conservative care Informed consent was obtained after describing risks and benefits of the procedure with the patient. This includes bleeding, bruising, infection, excessive weakness, or medication side effects. A REMS form is on file and signed.  Needle: 52mm injectable monopolar needle electrode  Number of units per muscle  Quadriceps 0 units Gastroc/soleus 0 units Hamstrings 100 units each biceps femoris 2 access points each Tibialis Posterior 0 units EHL 0 units Bilateral Hip Adductors 100 units each extremity, 3 access points each All injections were done after obtaining appropriate EMG activity and after negative drawback for blood. The patient tolerated the procedure well. Post procedure instructions were given. Return in about 2 months (around 11/20/2016).  I began low dose gabapentin for dysesthesias in legs. Could this be related to anastrozole?. Increase amantadine back to 150mg 

## 2016-09-20 NOTE — Patient Instructions (Addendum)
PLEASE FEEL FREE TO CALL OUR OFFICE WITH ANY PROBLEMS OR QUESTIONS ([phone])    GABAPENTIN VIA TUBE: 100MG  AT BEDTIME FOR 3 DAYS THEN TWICE DAILY FOR 3 DAYS THEN 3X DAILY THEREAFTER

## 2016-09-21 ENCOUNTER — Other Ambulatory Visit: Payer: Self-pay | Admitting: Physical Medicine & Rehabilitation

## 2016-09-21 ENCOUNTER — Encounter: Payer: Self-pay | Admitting: Adult Health

## 2016-09-21 ENCOUNTER — Ambulatory Visit (INDEPENDENT_AMBULATORY_CARE_PROVIDER_SITE_OTHER): Payer: Medicare Other | Admitting: Adult Health

## 2016-09-21 ENCOUNTER — Ambulatory Visit: Payer: Medicare Other | Admitting: Occupational Therapy

## 2016-09-21 VITALS — BP 132/70 | HR 99

## 2016-09-21 DIAGNOSIS — J209 Acute bronchitis, unspecified: Secondary | ICD-10-CM

## 2016-09-21 DIAGNOSIS — J9611 Chronic respiratory failure with hypoxia: Secondary | ICD-10-CM | POA: Diagnosis not present

## 2016-09-21 DIAGNOSIS — J398 Other specified diseases of upper respiratory tract: Secondary | ICD-10-CM | POA: Diagnosis not present

## 2016-09-21 DIAGNOSIS — Z930 Tracheostomy status: Secondary | ICD-10-CM

## 2016-09-21 NOTE — Progress Notes (Signed)
$'@Patient'a$  ID: Judy Spears, female    DOB: [birth year], 66 y.o.   MRN: [id number]  Chief Complaint  Patient presents with  . Follow-up    Referring provider: Cari Caraway, MD  HPI: Judy Spears she 2016 was bitten by a tick and then within 2 weeks had a progressive neurologic illness. Specifically she was found unresponsive by her husband while [place] in [place] of [place]. She was brought to local hospital where she required intubation due to unresponsiveness. Tracheostomy was performed on hospital day 2 and extensive workup for her neurologic illness begun. 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 She was then transferred for inpatient rehabilitation to the [facility] in [place]. While there she continued to participate in physical therapy and occupational therapy. However, during this time she was never able to consistently take by mouth and all nutrition has been provided the enteral feeding since around the time of her admission to the intensive care unit.  The tracheostomy has been in place since her original hospitalization in December 2016. It was briefly removed in May of 2017 but unfortunately she had a complication of laryngospasm and it sounds like the tracheostomy was replaced approximately 2 weeks later.  The tracheostomy was removed on 11/12/2015.  Admitted 12/28/15 w/ resp distress w/ multilobar PNA requiring intubation and re-trach on 12/3 w/ #6 trach.  Decannulated x2 in past. And had to be re-trached due to resp distress.  concerned that if they decannulate now it will not last .ENT consult during hospitalization by Dr. Constance Holster  Recommended that she will need a 3 layer closure under anesthesia when ready to have trach out.   Admitted April 2018 for acute respiratory failure secondary to have care associated pneumonia. She was treated with antibiotics.   09/21/2016 Follow up :  [facility]  Patient returns for three-month follow-up. She has trach dependent respiratory failure. Her trach is now capped 24 hours a day. She is no longer on any oxygen.    She was treated for bronchitis 2 weeks ago with increased thick mucus , and low-grade fever. Chest x-ray showed no acute process. Sputum culture was positive for light growth of MRSA. She was treated with Levaquin, which was sensitive. She is improved but continues to have some thick mucus. Fevers are better with a MAXIMUM TEMPERATURE of 99.3.  Remains on Tube feedings at nighttime. Now taking some foods orally.   Recently  diagnosed with left breast cancer underwent lumpectomy. She was started on Anastrozole but was unable to tolerate after 6 weeks of therapy. Had severe bone/muscle pain and increased muscle spasms. Says prior to lumpectomy/Anastrozole she was walking with assistance /walker but now is unable to walk due to severe muscle spasticity.  She is started PT /rehab this week.    Allergies  Allergen Reactions  . Cefepime Other (See Comments)    Status epilepticus  . Tetracyclines & Related Photosensitivity and Other (See Comments)    Blisters  . Pollen Extract Other (See Comments)    Running nose    Immunization History  Administered Date(s) Administered  . Influenza Split 10/26/2015  . Pneumococcal Polysaccharide-23 11/09/2015    Past Medical History:  Diagnosis Date  . Arthritis    lt great toe  . Complication of anesthesia    pt has had headaches post op-did advise to hydra well preop  . Hair loss    right-sided  .  History of exercise stress test    a. ETT (12/15) with 12:00 exercise, no ST changes, occasional PACs.   . Hyperlipidemia   . Insomnia   . Migraine headache   . Movement disorder    resltess in left legs  . Postmenopausal   . 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 Premature atrial contractions    a. Holter (12/15) with 8% PACs, no atrial runs or atrial fibrillation.     Tobacco  History: History  Smoking Status  . Never Smoker  Smokeless Tobacco  . Never Used   Counseling given: Not Answered   Outpatient Encounter Prescriptions as of 09/21/2016  Medication Sig  . amantadine (SYMMETREL) 50 MG/5ML solution Place 10 mLs (100 mg total) into feeding tube 2 (two) times daily. In am and at noon  . amLODipine (NORVASC) 5 MG tablet PLACE 1 TABLET INTO FEEDING TUBE DAILY  . atorvastatin (LIPITOR) 20 MG tablet Place 20 mg into feeding tube at bedtime.   . baclofen (LIORESAL) 10 MG tablet Place 1 tablet (10 mg total) into feeding tube every 6 (six) hours.  . chlorhexidine (PERIDEX) 0.12 % solution 47m IN THE MOUTH OR THROAT TWICE DAILY  . chlorhexidine gluconate, MEDLINE KIT, (PERIDEX) 0.12 % solution 15 mLs by Mouth Rinse route 2 (two) times daily. (Patient taking differently: 5 mLs by Mouth Rinse route 2 (two) times daily. )  . Coenzyme Q10 (COQ10 PO) Take 10 mLs by mouth at bedtime.  .Marland KitchenCRANBERRY JUICE EXTRACT PO Take by mouth.  . diazepam (VALIUM) 2 MG tablet PLACE 1 TABLET INTO FEEDING TUBE AT BEDTIME  . docusate (COLACE) 60 MG/15ML syrup Place 100 mg into feeding tube daily. Tales once daily, but can take 1 addt'l dose as needed for constipation  . famotidine (PEPCID) 20 MG tablet Take 1 tablet (20 mg total) by mouth 2 (two) times daily.  . fluticasone (FLONASE) 50 MCG/ACT nasal spray Place 1 spray into both nostrils 2 (two) times daily.  .Marland Kitchengabapentin (NEURONTIN) 100 MG capsule Take 1 capsule (100 mg total) by mouth 3 (three) times daily.  .Marland KitchenguaiFENesin (ROBITUSSIN) 100 MG/5ML SOLN Place 10 mLs (200 mg total) into feeding tube 4 (four) times daily.  . Hydrocortisone (GERHARDT'S BUTT CREAM) CREA Apply 1 application topically 4 (four) times daily.  .Marland Kitchenloratadine (CLARITIN) 10 MG tablet Place 1 tablet (10 mg total) into feeding tube daily as needed for allergies. (Patient taking differently: Place 10 mg into feeding tube every morning. )  . Melatonin 5 MG CAPS Place 5 mg  into feeding tube every evening.   . Multiple Vitamin (MULTIVITAMIN) LIQD Place 15 mLs into feeding tube daily.  .Marland Kitchennystatin (MYCOSTATIN/NYSTOP) powder Apply 1 g topically 2 (two) times daily as needed. (Patient taking differently: Apply 1 g topically 2 (two) times daily as needed (irritation). )  . potassium chloride 20 MEQ/15ML (10%) SOLN Place 15 mLs (20 mEq total) into feeding tube daily.  . Probiotic Product (PROBIOTIC ADVANCED) CAPS Take 1 capsule by mouth daily.  . prochlorperazine (COMPAZINE) 5 MG tablet Place 1-2 tablets (5-10 mg total) into feeding tube every 6 (six) hours as needed for nausea.  . simethicone (MYLICON) 40 MRU/0.4VWdrops Place 0.6 mLs (40 mg total) into feeding tube 4 (four) times daily as needed for flatulence.  .Marland KitchenUNABLE TO FIND Med Name: Good Belly probiotic 5oz every morning  . valACYclovir (VALTREX) 500 MG tablet PLACE 1 TABLET INTO FEEDING TUBE DAILY  . Water For Irrigation, Sterile (FREE WATER) SOLN Place 150  mLs into feeding tube See admin instructions. Takes 170m 5 times daily, if over 2088mhold water and recheck within an hour  . [DISCONTINUED] amantadine (SYMMETREL) 50 MG/5ML solution PLACE 1581mNTO FEEDING TUBE TWICE DAILY  . [DISCONTINUED] moxifloxacin (AVELOX) 400 MG tablet Take 1 tablet (400 mg total) by mouth daily at 8 pm. (Patient not taking: Reported on 09/21/2016)   No facility-administered encounter medications on file as of 09/21/2016.      Review of Systems  Constitutional:   +weight loss ,   HEENT:   +Difficulty swallowing,     CV:  No chest pain,  Orthopnea, PND anasarca, dizziness, palpitations, syncope.   GI  +tube feeding    Resp:    No chest wall deformity  Skin: no rash or lesions.    MS:  + muscle spasm, gen weakness, wc bound     Physical Exam  BP 132/70 (BP Location: Left Arm, Cuff Size: Small)   Pulse 99   LMP 12/01/2010   SpO2 96%   GEN: A/O ; pleasant , smiling NAD, chronically ill appearing in wheelchair     HEENT:  /AT,  , no postnasal drip or exudate noted.   NECK:  Supple w/ no JVD; normal carotid impulses w/o bruits; no lymphadenopathy.    RESP  Clear  P & A; w/o, wheezes/ rales/ or rhonchi. no accessory muscle use, no dullness to percussion  CARD:  RRR, no m/r/g, tr to none  peripheral edema, pulses intact, no cyanosis or clubbing.  GI:   Soft & nt; nml bowel sound, FT .   Musco: Warm bil, gen weakness .   Neuro: alert, +muscle spaciticity . .  [name] Skin: Warm, no lesions or rashes    Lab Results:  CBC  No results found for: BNP  ProBNP No results found for: PROBNP  Imaging: Dg Chest 2 View  Result Date: 09/08/2016 CLINICAL DATA:  Fever. EXAM: CHEST  2 VIEW COMPARISON:  06/18/2016. FINDINGS: Tracheostomy tube noted with its tip over the mid trachea. Mild right base subsegmental atelectasis and or scarring. Bilateral nipple shadows again noted. Surgical clips noted over the left chest. No pleural effusion or pneumothorax. No acute bony abnormality. Thoracic spine scoliosis. Gastrostomy tube noted in stable position. IMPRESSION: 1. Tracheostomy tube noted in stable position. Gastrostomy tube in stable position. 2. Mild right base subsegmental atelectasis and/or scarring again noted. No acute pulmonary disease. Electronically Signed   By: ThoBiehleOn: 09/08/2016 14:29     Assessment & Plan:   Chronic respiratory failure with hypoxia (HCCFultonmproved . Doing well with capped trach. No longer O2 dependent .   Acute tracheobronchitis MRSA + Bronchitis -Levaquin sensitive  Appears improved with abx . Hold on additional abx for now  cxr w/ no acute process.  Cont to follow  Plan  Patient Instructions  Continue with Trach care .  Continue with cough /breathing devices .  Follow up up Dr. McQLake Bells 3 months and and As needed      Tracheostomy status (HCCAttalaont with trach care  Cont w/ cough /pulmonary assist devices.      TamRexene EdisonP 09/21/2016

## 2016-09-21 NOTE — Assessment & Plan Note (Signed)
Cont with trach care  Cont w/ cough /pulmonary assist devices.

## 2016-09-21 NOTE — Progress Notes (Signed)
Reviewed, agree 

## 2016-09-21 NOTE — Assessment & Plan Note (Signed)
MRSA + Bronchitis -Levaquin sensitive  Appears improved with abx . Hold on additional abx for now  cxr w/ no acute process.  Cont to follow  Plan  Patient Instructions  Continue with Trach care .  Continue with cough /breathing devices .  Follow up up Dr. Lake Bells in 3 months and and As needed

## 2016-09-21 NOTE — Patient Instructions (Addendum)
Continue with Trach care .  Continue with cough /breathing devices .  Follow up up Dr. Lake Bells in 3 months and and As needed

## 2016-09-21 NOTE — Assessment & Plan Note (Signed)
Improved . Doing well with capped trach. No longer O2 dependent .

## 2016-09-22 ENCOUNTER — Telehealth: Payer: Self-pay | Admitting: Pulmonary Disease

## 2016-09-22 NOTE — Telephone Encounter (Signed)
Spoke with [place] at [facility]. He stated that they do not carry the supplies needed for the patient's trach. He stated that [facility] could see take on the patient and switch the patient over to their supplies.   TP, please advise if this is ok?

## 2016-09-23 ENCOUNTER — Telehealth: Payer: Self-pay | Admitting: Acute Care

## 2016-09-23 ENCOUNTER — Ambulatory Visit: Payer: Medicare Other | Admitting: Physical Therapy

## 2016-09-23 ENCOUNTER — Ambulatory Visit: Payer: Medicare Other | Admitting: Occupational Therapy

## 2016-09-23 ENCOUNTER — Telehealth: Payer: Self-pay

## 2016-09-23 DIAGNOSIS — Z930 Tracheostomy status: Secondary | ICD-10-CM

## 2016-09-23 DIAGNOSIS — J398 Other specified diseases of upper respiratory tract: Secondary | ICD-10-CM

## 2016-09-23 NOTE — Telephone Encounter (Signed)
-----   Message from Erick Colace, NP sent at 09/23/2016  3:37 PM EDT ----- Regarding: trach order ASAP please  Hey team,   I need you guys to place a order for the following for this patient  14 french suction catheters. Quantity # 30, refills # 12  Will need this asap as they are running low on supplies.   The patient's husband's # is [phone]  They use [facility].   Was told they did not have what he needed but I think it was because he was asking for a very specific catheter..  Thanks [name]

## 2016-09-23 NOTE — Telephone Encounter (Signed)
Order placed as requested by [facility].  Nothing further needed.

## 2016-09-23 NOTE — Telephone Encounter (Signed)
Can we discuss with [facility] which DME does trach supplies, we have several pt that have trachs so Im sure one of the DME has these or we could check with trach clinic to see how they get supplies.

## 2016-09-23 NOTE — Telephone Encounter (Signed)
Pt's husband ([name]) returned phone call

## 2016-09-23 NOTE — Telephone Encounter (Signed)
Called and spoke with pts husband and he was advised of the appt with the trach clinic.  He stated that by the time the appt comes around he will long be out of supplies and will run out in the next couple of days.    I called and spoke with [name] at the trach clinic and she stated that the earliest would be next week that they would be able to see the pt.  [name] would need to see what supplies the pt will need before any can be given.  She will have [name] call the pts husband.  She will call back if [name] is not able to call the pts husband.

## 2016-09-23 NOTE — Telephone Encounter (Signed)
Think it would be best going through the trach clinic for follow up and supplies Called the trach clinic @ [phone] and spoke with [name] >> appt scheduled with Pete on 9.12.18 @ 3pm  LMOM TCB x1 for pt's spouse

## 2016-09-24 ENCOUNTER — Telehealth: Payer: Self-pay | Admitting: Pulmonary Disease

## 2016-09-24 ENCOUNTER — Inpatient Hospital Stay (HOSPITAL_COMMUNITY)
Admission: EM | Admit: 2016-09-24 | Discharge: 2016-09-30 | DRG: 202 | Disposition: A | Discharge status: Rehabilitation Facility | Payer: Medicare Other | Attending: Family Medicine | Admitting: Family Medicine

## 2016-09-24 ENCOUNTER — Encounter (HOSPITAL_COMMUNITY): Payer: Self-pay | Admitting: *Deleted

## 2016-09-24 ENCOUNTER — Telehealth: Payer: Self-pay | Admitting: Acute Care

## 2016-09-24 ENCOUNTER — Telehealth: Payer: Self-pay | Admitting: *Deleted

## 2016-09-24 ENCOUNTER — Inpatient Hospital Stay (HOSPITAL_COMMUNITY): Payer: Medicare Other

## 2016-09-24 DIAGNOSIS — R29898 Other symptoms and signs involving the musculoskeletal system: Secondary | ICD-10-CM | POA: Diagnosis not present

## 2016-09-24 DIAGNOSIS — C50212 Malignant neoplasm of upper-inner quadrant of left female breast: Secondary | ICD-10-CM | POA: Diagnosis not present

## 2016-09-24 DIAGNOSIS — J189 Pneumonia, unspecified organism: Secondary | ICD-10-CM | POA: Diagnosis not present

## 2016-09-24 DIAGNOSIS — G0490 Encephalitis and encephalomyelitis, unspecified: Secondary | ICD-10-CM | POA: Diagnosis not present

## 2016-09-24 DIAGNOSIS — G8250 Quadriplegia, unspecified: Secondary | ICD-10-CM | POA: Diagnosis not present

## 2016-09-24 DIAGNOSIS — R52 Pain, unspecified: Secondary | ICD-10-CM

## 2016-09-24 DIAGNOSIS — J15212 Pneumonia due to Methicillin resistant Staphylococcus aureus: Secondary | ICD-10-CM

## 2016-09-24 DIAGNOSIS — I10 Essential (primary) hypertension: Secondary | ICD-10-CM

## 2016-09-24 DIAGNOSIS — A848 Other tick-borne viral encephalitis: Secondary | ICD-10-CM | POA: Diagnosis present

## 2016-09-24 DIAGNOSIS — E871 Hypo-osmolality and hyponatremia: Secondary | ICD-10-CM | POA: Diagnosis present

## 2016-09-24 DIAGNOSIS — J9612 Chronic respiratory failure with hypercapnia: Secondary | ICD-10-CM | POA: Diagnosis not present

## 2016-09-24 DIAGNOSIS — B9562 Methicillin resistant Staphylococcus aureus infection as the cause of diseases classified elsewhere: Secondary | ICD-10-CM | POA: Diagnosis present

## 2016-09-24 DIAGNOSIS — Z431 Encounter for attention to gastrostomy: Secondary | ICD-10-CM | POA: Diagnosis not present

## 2016-09-24 DIAGNOSIS — C50912 Malignant neoplasm of unspecified site of left female breast: Secondary | ICD-10-CM | POA: Diagnosis present

## 2016-09-24 DIAGNOSIS — L8990 Pressure ulcer of unspecified site, unspecified stage: Secondary | ICD-10-CM | POA: Insufficient documentation

## 2016-09-24 DIAGNOSIS — R5381 Other malaise: Secondary | ICD-10-CM | POA: Diagnosis not present

## 2016-09-24 DIAGNOSIS — J9690 Respiratory failure, unspecified, unspecified whether with hypoxia or hypercapnia: Secondary | ICD-10-CM

## 2016-09-24 DIAGNOSIS — Z681 Body mass index (BMI) 19 or less, adult: Secondary | ICD-10-CM | POA: Diagnosis not present

## 2016-09-24 DIAGNOSIS — R0689 Other abnormalities of breathing: Secondary | ICD-10-CM | POA: Diagnosis not present

## 2016-09-24 DIAGNOSIS — E43 Unspecified severe protein-calorie malnutrition: Secondary | ICD-10-CM | POA: Diagnosis present

## 2016-09-24 DIAGNOSIS — R339 Retention of urine, unspecified: Secondary | ICD-10-CM

## 2016-09-24 DIAGNOSIS — A499 Bacterial infection, unspecified: Secondary | ICD-10-CM | POA: Diagnosis not present

## 2016-09-24 DIAGNOSIS — Z931 Gastrostomy status: Secondary | ICD-10-CM

## 2016-09-24 DIAGNOSIS — G4700 Insomnia, unspecified: Secondary | ICD-10-CM | POA: Diagnosis present

## 2016-09-24 DIAGNOSIS — Z930 Tracheostomy status: Secondary | ICD-10-CM

## 2016-09-24 DIAGNOSIS — E876 Hypokalemia: Secondary | ICD-10-CM | POA: Diagnosis not present

## 2016-09-24 DIAGNOSIS — J219 Acute bronchiolitis, unspecified: Secondary | ICD-10-CM | POA: Diagnosis not present

## 2016-09-24 DIAGNOSIS — R251 Tremor, unspecified: Secondary | ICD-10-CM | POA: Diagnosis not present

## 2016-09-24 DIAGNOSIS — E785 Hyperlipidemia, unspecified: Secondary | ICD-10-CM | POA: Diagnosis present

## 2016-09-24 DIAGNOSIS — R252 Cramp and spasm: Secondary | ICD-10-CM | POA: Diagnosis not present

## 2016-09-24 DIAGNOSIS — J209 Acute bronchitis, unspecified: Secondary | ICD-10-CM | POA: Diagnosis present

## 2016-09-24 DIAGNOSIS — E869 Volume depletion, unspecified: Secondary | ICD-10-CM | POA: Diagnosis present

## 2016-09-24 DIAGNOSIS — R627 Adult failure to thrive: Secondary | ICD-10-CM | POA: Diagnosis present

## 2016-09-24 DIAGNOSIS — Z79899 Other long term (current) drug therapy: Secondary | ICD-10-CM

## 2016-09-24 DIAGNOSIS — J9811 Atelectasis: Secondary | ICD-10-CM | POA: Diagnosis present

## 2016-09-24 DIAGNOSIS — K5900 Constipation, unspecified: Secondary | ICD-10-CM | POA: Diagnosis not present

## 2016-09-24 DIAGNOSIS — E46 Unspecified protein-calorie malnutrition: Secondary | ICD-10-CM

## 2016-09-24 DIAGNOSIS — Z170 Estrogen receptor positive status [ER+]: Secondary | ICD-10-CM | POA: Diagnosis not present

## 2016-09-24 DIAGNOSIS — G9340 Encephalopathy, unspecified: Secondary | ICD-10-CM | POA: Diagnosis present

## 2016-09-24 DIAGNOSIS — A852 Arthropod-borne viral encephalitis, unspecified: Secondary | ICD-10-CM | POA: Diagnosis not present

## 2016-09-24 DIAGNOSIS — J40 Bronchitis, not specified as acute or chronic: Secondary | ICD-10-CM

## 2016-09-24 DIAGNOSIS — J9610 Chronic respiratory failure, unspecified whether with hypoxia or hypercapnia: Secondary | ICD-10-CM | POA: Diagnosis not present

## 2016-09-24 DIAGNOSIS — G0491 Myelitis, unspecified: Secondary | ICD-10-CM | POA: Diagnosis not present

## 2016-09-24 DIAGNOSIS — N390 Urinary tract infection, site not specified: Secondary | ICD-10-CM | POA: Diagnosis not present

## 2016-09-24 DIAGNOSIS — Z9889 Other specified postprocedural states: Secondary | ICD-10-CM | POA: Diagnosis not present

## 2016-09-24 DIAGNOSIS — A4902 Methicillin resistant Staphylococcus aureus infection, unspecified site: Secondary | ICD-10-CM | POA: Diagnosis not present

## 2016-09-24 LAB — PROTIME-INR
INR: 1.17
PROTHROMBIN TIME: 14.8 s (ref 11.4–15.2)

## 2016-09-24 LAB — COMPREHENSIVE METABOLIC PANEL
ALBUMIN: 3.2 g/dL — AB (ref 3.5–5.0)
ALK PHOS: 180 U/L — AB (ref 38–126)
ALT: 69 U/L — AB (ref 14–54)
AST: 56 U/L — ABNORMAL HIGH (ref 15–41)
Anion gap: 12 (ref 5–15)
BUN: 14 mg/dL (ref 6–20)
CALCIUM: 9.1 mg/dL (ref 8.9–10.3)
CHLORIDE: 94 mmol/L — AB (ref 101–111)
CO2: 25 mmol/L (ref 22–32)
Creatinine, Ser: 0.36 mg/dL — ABNORMAL LOW (ref 0.44–1.00)
GFR calc Af Amer: >60 mL/min (ref >60)
GFR calc non Af Amer: >60 mL/min (ref >60)
GLUCOSE: 98 mg/dL (ref 65–99)
Potassium: 3.7 mmol/L (ref 3.5–5.1)
SODIUM: 131 mmol/L — AB (ref 135–145)
Total Bilirubin: 1.4 mg/dL — ABNORMAL HIGH (ref 0.3–1.2)
Total Protein: 7 g/dL (ref 6.5–8.1)

## 2016-09-24 LAB — CBC WITH DIFFERENTIAL/PLATELET
Basophils Absolute: 0 10*3/uL (ref 0.0–0.1)
Basophils Relative: 0 %
EOS ABS: 0.1 10*3/uL (ref 0.0–0.7)
Eosinophils Relative: 1 %
HEMATOCRIT: 39.3 % (ref 36.0–46.0)
HEMOGLOBIN: 13.3 g/dL (ref 12.0–15.0)
LYMPHS ABS: 1.4 10*3/uL (ref 0.7–4.0)
LYMPHS PCT: 14 %
MCH: 29 pg (ref 26.0–34.0)
MCHC: 33.8 g/dL (ref 30.0–36.0)
MCV: 85.8 fL (ref 78.0–100.0)
MONOS PCT: 11 %
Monocytes Absolute: 1.1 10*3/uL — ABNORMAL HIGH (ref 0.1–1.0)
NEUTROS PCT: 74 %
Neutro Abs: 7.4 10*3/uL (ref 1.7–7.7)
Platelets: 325 10*3/uL (ref 150–400)
RBC: 4.58 MIL/uL (ref 3.87–5.11)
RDW: 12.8 % (ref 11.5–15.5)
WBC: 9.9 10*3/uL (ref 4.0–10.5)

## 2016-09-24 LAB — URINALYSIS, ROUTINE W REFLEX MICROSCOPIC
Bilirubin Urine: NEGATIVE
GLUCOSE, UA: NEGATIVE mg/dL
Hgb urine dipstick: NEGATIVE
Ketones, ur: NEGATIVE mg/dL
Leukocytes, UA: NEGATIVE
Nitrite: NEGATIVE
PH: 6 (ref 5.0–8.0)
Protein, ur: NEGATIVE mg/dL
SPECIFIC GRAVITY, URINE: 1.025 (ref 1.005–1.030)

## 2016-09-24 LAB — I-STAT CG4 LACTIC ACID, ED: Lactic Acid, Venous: 1.09 mmol/L (ref 0.5–1.9)

## 2016-09-24 MED ORDER — HEPARIN SODIUM (PORCINE) 5000 UNIT/ML IJ SOLN
5000.0000 [IU] | Freq: Three times a day (TID) | INTRAMUSCULAR | Status: DC
  Administered 2016-09-25 – 2016-09-30 (×17): 5000 [IU] via SUBCUTANEOUS
  Filled 2016-09-24 (×16): qty 1

## 2016-09-24 MED ORDER — BISACODYL 10 MG RE SUPP: 10.0000 mg | Freq: Every day | RECTAL | Status: DC | PRN

## 2016-09-24 MED ORDER — OSMOLITE 1.5 CAL PO LIQD
1000.0000 mL | ORAL | Status: DC
  Administered 2016-09-24 – 2016-09-29 (×6): 1000 mL
  Filled 2016-09-24 (×7): qty 1000

## 2016-09-24 MED ORDER — SODIUM CHLORIDE 0.9 % IV SOLN
INTRAVENOUS | Status: DC
  Administered 2016-09-24: 1000 mL via INTRAVENOUS
  Administered 2016-09-25 – 2016-09-30 (×6): via INTRAVENOUS

## 2016-09-24 MED ORDER — VANCOMYCIN HCL IN DEXTROSE 1-5 GM/200ML-% IV SOLN
1000.0000 mg | Freq: Once | INTRAVENOUS | Status: AC
  Administered 2016-09-25: 1000 mg via INTRAVENOUS
  Filled 2016-09-24: qty 200

## 2016-09-24 MED ORDER — VANCOMYCIN HCL IN DEXTROSE 1-5 GM/200ML-% IV SOLN
1000.0000 mg | INTRAVENOUS | Status: DC
  Filled 2016-09-24 (×2): qty 200

## 2016-09-24 MED ORDER — SACCHAROMYCES BOULARDII 250 MG PO CAPS
250.0000 mg | ORAL_CAPSULE | Freq: Every day | ORAL | Status: DC
  Administered 2016-09-25 – 2016-09-30 (×6): 250 mg via ORAL
  Filled 2016-09-24 (×6): qty 1

## 2016-09-24 MED ORDER — LEVOFLOXACIN IN D5W 500 MG/100ML IV SOLN
500.0000 mg | INTRAVENOUS | Status: DC
  Administered 2016-09-25: 500 mg via INTRAVENOUS
  Filled 2016-09-24 (×2): qty 100

## 2016-09-24 MED ORDER — FREE WATER
200.0000 mL | Freq: Four times a day (QID) | Status: DC
  Administered 2016-09-25 – 2016-09-28 (×12): 200 mL

## 2016-09-24 MED ORDER — SODIUM CHLORIDE 0.9 % IV BOLUS (SEPSIS)
1000.0000 mL | Freq: Once | INTRAVENOUS | Status: AC
Start: 2016-09-24 — End: 2016-09-24
  Administered 2016-09-24: 1000 mL via INTRAVENOUS

## 2016-09-24 MED ORDER — LEVOFLOXACIN IN D5W 500 MG/100ML IV SOLN
500.0000 mg | Freq: Once | INTRAVENOUS | Status: AC
  Administered 2016-09-24: 500 mg via INTRAVENOUS
  Filled 2016-09-24: qty 100

## 2016-09-24 NOTE — ED Triage Notes (Signed)
Per family reports that pt having decrease in mobility over past several days. Having low grade fever for several days, constipation, distended bowel, decreased urine output, increased tracheal secretions.

## 2016-09-24 NOTE — Telephone Encounter (Signed)
Mr Culbreath called again to make sure I received his message from last night.  I assured him I did and passed on to Dr [name], who is unable to make the call at present.  I called Mr Hofstra back to let him know that the message was received and that he would get a call from Dr [name] as soon as he was able, and if the concern is that great about her condition perhaps he should take her on to the ED.  He did not feel it warranted the ED at this time. Dr [name] will call when he is free to do so.

## 2016-09-24 NOTE — Procedures (Signed)
Pt arrives to ED with #[id number] shiley in place, trach is capped, pt on RA.  Sputum sample obtained and sent to lab.  Cap removed and pt placed on 21% ATC due to increased amounts of thick secretions. RT will monitor

## 2016-09-24 NOTE — ED Notes (Signed)
Respiratory called to suction trach.  

## 2016-09-24 NOTE — Telephone Encounter (Signed)
Mr Samet called and says Judy Spears is still running a low grade fever and some other symptoms and he feels she "just is not looking good" and is asking for Dr [name] to please give him a call in the am (on 09/24/16)  His office # is [phone]

## 2016-09-24 NOTE — ED Provider Notes (Signed)
[facility] DEPT Provider Note   CSN: [id number] Arrival date & time: 09/24/16  1556   LEVEL 5 CAVEAT - NONVERBAL.  History   Chief Complaint Chief Complaint  Patient presents with  . Fever  . Weakness    HPI Judy Spears is a 66 y.o. female.  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 Husband provides the history. She has had a progressive decline for the past few months. Used to be able to walk with a walker, now can barely stand to pivot from wheelchair to bed. Over past 6 days has had low grade fever, max of about 100.3. Given tylenol for this. Recently on antibiotics for acute tracheobronchitis after a positive culture for MRSA (moxifloxacin). No change noted per husband. Before this recent increased congestion/cough for past week, he most recently suctioned her trach 4 months ago. Now has to do it 2x per day. It is white in appearance. No vomiting. Decreased speaking. Decreased UOP, most recently urinated this AM. Having constipation as well. Also generally weak. Seems to stare off more as well.  Past Medical History:  Diagnosis Date  . Arthritis    lt great toe  . Complication of anesthesia    pt has had headaches post op-did advise to hydra well preop  . Hair loss    right-sided  . History of exercise stress test    a. ETT (12/15) with 12:00 exercise, no ST changes, occasional PACs.   . Hyperlipidemia   . Insomnia   . Migraine headache   . Movement disorder    resltess in left legs  . Postmenopausal   . 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 Premature atrial contractions    a. Holter (12/15) with 8% PACs, no atrial runs or atrial fibrillation.     Patient Active Problem List   Diagnosis Date Noted  . Respiratory insufficiency 09/24/2016  . Malignant neoplasm of upper-inner quadrant of left breast in female, estrogen receptor positive ([place]) 07/14/2016  .  Heterotopic ossification of bone 03/10/2016  . Acute on chronic respiratory failure with hypoxia ([place])   . Acute hypoxemic respiratory failure ([place])   . Acute blood loss anemia   . Atelectasis 02/04/2016  . Debility 02/03/2016  . Pneumonia of both lower lobes due to Pseudomonas species ([place])   . Pneumonia of both lower lobes due to methicillin susceptible Staphylococcus aureus (MSSA) ([place])   . Acute respiratory failure with hypoxia ([place])   . Acute encephalopathy   . Seizures ([place])   . Sepsis ([place])   . Hypoxia   . Pain   . HCAP (healthcare-associated pneumonia) 12/28/2015  . Chronic respiratory failure ([place]) 12/28/2015  . Acute tracheobronchitis 12/24/2015  . Tracheostomy tube present ([place])   . Tracheal stenosis   . Chronic respiratory failure with hypoxia ([place])   . 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 Tracheostomy status ([place]) 09/26/2015  . Allergic rhinitis 09/26/2015  . 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 Movement disorder 09/22/2015  . Encephalitis 09/22/2015  . Chest pain 02/07/2014  . Premature atrial contractions 01/13/2014  . Abnormality of gait 12/04/2013  . Hyperlipidemia 12/21/2011  . Cardiovascular risk factor 12/21/2011  . Bunion 01/31/2008  . Metatarsalgia of both feet 09/20/2007  . FLAT FOOT 09/20/2007  . HALLUX RIGIDUS, ACQUIRED 09/20/2007    Past Surgical History:  Procedure Laterality Date  . BREAST BIOPSY  01/01/2011   Procedure:  BREAST BIOPSY WITH NEEDLE LOCALIZATION;  Surgeon: Rolm Bookbinder, MD;  Location: [facility];  Service: General;  Laterality: Left;  left breast wire localization biopsy  . BREAST BIOPSY Right 07/27/2012  . BREAST BIOPSY Right 07/07/2016  . BREAST BIOPSY Left 06/30/2016  . cataracts right eye    . CESAREAN SECTION  1986  . HERNIA REPAIR  2000   [facility]  . opirectinal membrane peel    . PEG PLACEMENT    . RHINOPLASTY  1983  . TRACHEOSTOMY      OB History    No data available       Home  Medications    Prior to Admission medications   Medication Sig Start Date End Date Taking? Authorizing Provider  amantadine (SYMMETREL) 50 MG/5ML solution Place 10 mLs (100 mg total) into feeding tube 2 (two) times daily. In am and at noon Patient taking differently: Place 150 mg into feeding tube 2 (two) times daily. In am and at noon  09/08/16  Yes Meredith Staggers, MD  amLODipine (NORVASC) 5 MG tablet PLACE ONE TABLET INTO FEEDING TUBE DAILY 09/21/16  Yes Meredith Staggers, MD  atorvastatin (LIPITOR) 20 MG tablet Place 20 mg into feeding tube at bedtime.    Yes [provider]  baclofen (LIORESAL) 10 MG tablet Place 1 tablet (10 mg total) into feeding tube every 6 (six) hours. Patient taking differently: Place 10 mg into feeding tube every 6 (six) hours. 8am 12noon 8 pm 03/29/16  Yes Meredith Staggers, MD  chlorhexidine gluconate, MEDLINE KIT, (PERIDEX) 0.12 % solution 15 mLs by Mouth Rinse route 2 (two) times daily. Patient taking differently: 5 mLs by Mouth Rinse route 2 (two) times daily.  01/04/16  Yes Cherene Altes, MD  Coenzyme Q10 (COQ10 PO) Take 10 mLs by mouth at bedtime.   Yes [provider]  CRANBERRY JUICE EXTRACT PO Take by mouth daily. In the morning   Yes [provider]  diazepam (VALIUM) 2 MG tablet PLACE 1 TABLET INTO FEEDING TUBE AT BEDTIME Patient taking differently: PLACE 2 mg TABLET INTO FEEDING TUBE AT BEDTIME 07/23/16  Yes Meredith Staggers, MD  docusate (COLACE) 60 MG/15ML syrup Place 100 mg into feeding tube 2 (two) times daily. Takes twice a day but can take 1 addt'l dose as needed for constipation   Yes [provider]  famotidine (PEPCID) 20 MG tablet Take 1 tablet (20 mg total) by mouth 2 (two) times daily. 05/11/16  Yes Ollis, Brandi L, NP  fluticasone (FLONASE) 50 MCG/ACT nasal spray Place 1 spray into both nostrils 2 (two) times daily.   Yes [provider]  gabapentin (NEURONTIN) 100 MG capsule Take 1 capsule (100  mg total) by mouth 3 (three) times daily. Patient taking differently: Take 100 mg by mouth 3 (three) times daily. Feeding tube 09/20/16  Yes Meredith Staggers, MD  guaiFENesin (ROBITUSSIN) 100 MG/5ML SOLN Place 10 mLs (200 mg total) into feeding tube 4 (four) times daily. Patient taking differently: Place 10 mLs into feeding tube as needed for to loosen phlegm.  02/25/16  Yes Love, Ivan Anchors, PA-C  loratadine (CLARITIN) 10 MG tablet Place 1 tablet (10 mg total) into feeding tube daily as needed for allergies. Patient taking differently: Place 10 mg into feeding tube every morning. daily 02/25/16  Yes Love, Ivan Anchors, PA-C  Melatonin 5 MG CAPS Place 5 mg into feeding tube every evening.    Yes [provider]  Multiple Vitamin (  MULTIVITAMIN) LIQD Place 15 mLs into feeding tube daily. 05/11/16  Yes Ollis, Brandi L, NP  nystatin (MYCOSTATIN/NYSTOP) powder Apply 1 g topically 2 (two) times daily as needed. Patient taking differently: Apply 1 g topically 2 (two) times daily as needed (irritation).  02/25/16  Yes Love, Ivan Anchors, PA-C  potassium chloride 20 MEQ/15ML (10%) SOLN Place 15 mLs (20 mEq total) into feeding tube daily. 05/05/16  Yes Meredith Staggers, MD  Probiotic Product (PROBIOTIC ADVANCED) CAPS Take 1 capsule by mouth daily.   Yes [provider]  prochlorperazine (COMPAZINE) 5 MG tablet Place 1-2 tablets (5-10 mg total) into feeding tube every 6 (six) hours as needed for nausea. 02/25/16  Yes Love, Ivan Anchors, PA-C  simethicone (MYLICON) 40 BO/1.7PZ drops Place 0.6 mLs (40 mg total) into feeding tube 4 (four) times daily as needed for flatulence. 02/25/16  Yes Love, Ivan Anchors, PA-C  UNABLE TO FIND Med Name: Good Belly probiotic 5oz every morning   Yes [provider]  valACYclovir (VALTREX) 500 MG tablet PLACE 1 TABLET INTO FEEDING TUBE DAILY Patient taking differently: PLACE 500 mg TABLET INTO FEEDING TUBE DAILY 06/10/16  Yes Bayard Hugger, NP  Water For Irrigation, Sterile  (FREE WATER) SOLN Place 150 mLs into feeding tube See admin instructions. Takes 137m 5 times daily, if over 2093mhold water and recheck within an hour   Yes [provider]  Hydrocortisone (GERHARDT'S BUTT CREAM) CREA Apply 1 application topically 4 (four) times daily. Patient taking differently: Apply 1 application topically as needed for irritation. Rear end 02/25/16   Love, PaIvan AnchorsPA-C    Family History Family History  Problem Relation Age of Onset  . Stroke Mother        paralyzed on right side/aphasia  . Hypertension Mother        had stroke after stopping BP meds  . Colon cancer Father   . Prostate cancer Father   . Heart attack Maternal Grandfather 56  . Stroke Maternal Uncle [id number]  Social History Social History  Substance Use Topics  . Smoking status: Never Smoker  . Smokeless tobacco: Never Used  . Alcohol use 0.0 oz/week     Comment: Occasional.     Allergies   Cefepime; Tetracyclines & related; Doxycycline monohydrate; Pollen extract; and Tetracycline   Review of Systems Review of Systems  Unable to perform ROS: Patient nonverbal     Physical Exam Updated Vital Signs BP 128/85 (BP Location: Right Arm)   Pulse 88   Temp 98.4 F (36.9 C) (Rectal)   Resp 19   Wt 45.4 kg (100 lb)   LMP 12/01/2010   SpO2 99%   BMI 17.16 kg/m   Physical Exam  Constitutional: She appears well-developed. She appears cachectic.  Occasional wet cough/congestion in oropharynx  HENT:  Head: Normocephalic and atraumatic.  Right Ear: External ear normal.  Left Ear: External ear normal.  Nose: Nose normal.  Mouth/Throat: Mucous membranes are dry.  Eyes: Right eye exhibits no discharge. Left eye exhibits no discharge.  Neck:  Trach in place, no surrounding redness/drainage  Cardiovascular: Normal rate, regular rhythm and normal heart sounds.   Pulmonary/Chest: Effort normal and breath sounds normal.  Abdominal: Soft. She exhibits no distension. There is no  tenderness.  PEG in place  Neurological: She is alert.  Awake, alert, seems to stare when I am not talking directly to her. However she responds to commands and is active when stimulated.  Skin: Skin is warm  and dry.  Nursing note and vitals reviewed.    ED Treatments / Results  Labs (all labs ordered are listed, but only abnormal results are displayed) Labs Reviewed  COMPREHENSIVE METABOLIC PANEL - Abnormal; Notable for the following:       Result Value   Sodium 131 (*)    Chloride 94 (*)    Creatinine, Ser 0.36 (*)    Albumin 3.2 (*)    AST 56 (*)    ALT 69 (*)    Alkaline Phosphatase 180 (*)    Total Bilirubin 1.4 (*)    All other components within normal limits  CBC WITH DIFFERENTIAL/PLATELET - Abnormal; Notable for the following:    Monocytes Absolute 1.1 (*)    All other components within normal limits  URINALYSIS, ROUTINE W REFLEX MICROSCOPIC - Abnormal; Notable for the following:    APPearance HAZY (*)    All other components within normal limits  CULTURE, RESPIRATORY (NON-EXPECTORATED)  CULTURE, BLOOD (ROUTINE X 2)  CULTURE, BLOOD (ROUTINE X 2)  URINE CULTURE  MRSA PCR SCREENING  PROTIME-INR  BASIC METABOLIC PANEL  CBC  I-STAT CG4 LACTIC ACID, ED    EKG  EKG Interpretation None       Radiology Dg Chest Port 1 View  Result Date: 09/24/2016 CLINICAL DATA:  Low-grade fever and excess secretions. EXAM: PORTABLE CHEST 1 VIEW COMPARISON:  09/08/2016. FINDINGS: Stable normal sized heart. No significant change in linear density at the right lung base. Interval minimal linear density on the left. Poor inspiration. Diffuse osteopenia. Tracheostomy tube in satisfactory position. IMPRESSION: 1. Poor inspiration with interval minimal linear atelectasis on the left. 2. Stable right basilar linear atelectasis or scarring. Electronically Signed   By: Claudie Revering M.D.   On: 09/24/2016 20:18    Procedures Procedures (including critical care time)  Medications Ordered  in ED Medications  heparin injection 5,000 Units (not administered)  0.9 %  sodium chloride infusion (1,000 mLs Intravenous Transfusing/Transfer 09/24/16 2023)  bisacodyl (DULCOLAX) suppository 10 mg (not administered)  feeding supplement (OSMOLITE 1.5 CAL) liquid 1,000 mL (1,000 mLs Per Tube New Bag/Given 09/24/16 2250)  free water 200 mL (not administered)  saccharomyces boulardii (FLORASTOR) capsule 250 mg (not administered)  vancomycin (VANCOCIN) IVPB 1000 mg/200 mL premix (not administered)  levofloxacin (LEVAQUIN) IVPB 500 mg (not administered)  vancomycin (VANCOCIN) IVPB 1000 mg/200 mL premix (not administered)  sodium chloride 0.9 % bolus 1,000 mL (0 mLs Intravenous Stopped 09/24/16 2022)  levofloxacin (LEVAQUIN) IVPB 500 mg (0 mg Intravenous Stopped 09/24/16 2047)     Initial Impression / Assessment and Plan / ED Course  I have reviewed the triage vital signs and the nursing notes.  Pertinent labs & imaging results that were available during my care of the patient were reviewed by me and considered in my medical decision making (see chart for details).     No clear PNA on CXR. Patient has had recent significant decline. With increased trach secretions, concern for tracheobronchitis. Dr Lake Bells knows well, will admit and put on antibiotics.   Final Clinical Impressions(s) / ED Diagnoses   Final diagnoses:  Tracheobronchitis    New Prescriptions Current Discharge Medication List       Sherwood Gambler, MD 09/25/16 [phone]

## 2016-09-24 NOTE — Telephone Encounter (Signed)
I spoke with Judy Spears again and he has just spoken with Dr [name]. Encounter will be closed.

## 2016-09-24 NOTE — Progress Notes (Signed)
Suctioned for moderate amounts of thick yellow secretions.  MD stated to place on CATC.

## 2016-09-24 NOTE — ED Notes (Signed)
Waiting for Respiratory for transport to floor

## 2016-09-24 NOTE — Progress Notes (Signed)
Pharmacy Antibiotic Note  Judy Spears is a 66 y.o. female admitted on 09/24/2016 with pneumonia.    Plan: Lvq 500 q24 Vanc 1g q24h Monitor renal fx, cx, vt prn  Weight: 100 lb (45.4 kg)  Temp (24hrs), Avg:98 F (36.7 C), Min:97.6 F (36.4 C), Max:98.4 F (36.9 C)   Recent Labs Lab 09/24/16 1640 09/24/16 1700  WBC 9.9  --   CREATININE 0.36*  --   LATICACIDVEN  --  1.09    Estimated Creatinine Clearance: 50.2 mL/min (A) (by C-G formula based on SCr of 0.36 mg/dL (L)).    Allergies  Allergen Reactions  . Cefepime Other (See Comments)    Status epilepticus- this reaction is questionable per husband.States that it was determined that pt did not have seizure activity. Status epilepticus  . Tetracyclines & Related Photosensitivity and Other (See Comments)    Blisters  . Doxycycline Monohydrate     Blisters, photosensitivity- family state that the reaction is actually to tetracycline.  . Pollen Extract Other (See Comments)    Running nose  . Tetracycline Other (See Comments) and Photosensitivity    Blisters as a child.   Levester Fresh, PharmD, BCPS, BCCCP Clinical Pharmacist Clinical phone for 09/24/2016 from 7a-3:30p: [phone] If after 3:30p, please call main pharmacy at: [phone] 09/24/2016 8:42 PM

## 2016-09-24 NOTE — ED Notes (Signed)
Attempted to call report

## 2016-09-24 NOTE — Telephone Encounter (Signed)
I called and left a VM. I will try again.

## 2016-09-24 NOTE — Progress Notes (Addendum)
Patient transported to 5W on 55% ATC. Tracheal suction prior to transport. Large amount of thick yellow secretions obtained.

## 2016-09-24 NOTE — H&P (Signed)
PULMONARY / CRITICAL CARE MEDICINE   Name: Judy Spears MRN: [id number] DOB: July 16, 1950    ADMISSION DATE:  09/24/2016 CONSULTATION DATE:  09/24/2016  REFERRING MD:  Dr. Regenia Skeeter  CHIEF COMPLAINT:  Shortness of breath  HISTORY OF PRESENT ILLNESS:   66 year old female who is my office patient presents today with 1 week of increasing mucus production, lethargy, and low-grade fever. 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 Despite this occurring in late 2016, early 2017 she was able to make a significant recovery to be able to ambulate with assistance. Her course since then has been complicated by episodes of pneumonia, lengthy hospitalizations for the same with rehabilitation afterwards. She's also had C. difficile. 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  Approximately 6-8 weeks ago she was diagnosed with breast cancer. She had a lumpectomy performed. She was treated with anastrozole. She had some notable weakness after this. Since then she has had difficulty with ambulation.  Approximately 2-1/2-3 weeks ago she developed increasing cough with mucus production. She was treated with Levaquin. She did have some improvement and mucus production but the Levaquin stopped about a week ago. Not long after that she started to have increasing mucus production, lethargy, and a low-grade fever. She's required frequent suctioning throughout the course of the week. Based on communications we've had with the family we recommended that she come to be evaluated in the emergency department.  PAST MEDICAL HISTORY :  She  has a past medical history of Arthritis; Complication of anesthesia; Hair loss; History of exercise stress test; Hyperlipidemia; Insomnia; Migraine headache; Movement disorder; Postmenopausal; [place] encephalitis; and Premature atrial contractions.  PAST SURGICAL HISTORY: She  has a past surgical history  that includes Hernia repair (2000); Rhinoplasty (1983); Cesarean section (1986); opirectinal membrane peel; cataracts right eye; Breast biopsy (01/01/2011); Breast biopsy (Right, 07/27/2012); Breast biopsy (Right, 07/07/2016); Breast biopsy (Left, 06/30/2016); Tracheostomy; and PEG placement.  Allergies  Allergen Reactions  . Cefepime Other (See Comments)    Status epilepticus  . Tetracyclines & Related Photosensitivity and Other (See Comments)    Blisters  . Pollen Extract Other (See Comments)    Running nose    No current facility-administered medications on file prior to encounter.    Current Outpatient Prescriptions on File Prior to Encounter  Medication Sig  . amantadine (SYMMETREL) 50 MG/5ML solution Place 10 mLs (100 mg total) into feeding tube 2 (two) times daily. In am and at noon  . amLODipine (NORVASC) 5 MG tablet PLACE ONE TABLET INTO FEEDING TUBE DAILY  . atorvastatin (LIPITOR) 20 MG tablet Place 20 mg into feeding tube at bedtime.   . baclofen (LIORESAL) 10 MG tablet Place 1 tablet (10 mg total) into feeding tube every 6 (six) hours.  . chlorhexidine (PERIDEX) 0.12 % solution 55m IN THE MOUTH OR THROAT TWICE DAILY  . chlorhexidine gluconate, MEDLINE KIT, (PERIDEX) 0.12 % solution 15 mLs by Mouth Rinse route 2 (two) times daily. (Patient taking differently: 5 mLs by Mouth Rinse route 2 (two) times daily. )  . Coenzyme Q10 (COQ10 PO) Take 10 mLs by mouth at bedtime.  .Marland KitchenCRANBERRY JUICE EXTRACT PO Take by mouth.  . diazepam (VALIUM) 2 MG tablet PLACE 1 TABLET INTO FEEDING TUBE AT BEDTIME  . docusate (COLACE) 60 MG/15ML syrup Place 100 mg into feeding tube daily. Tales once daily, but can take 1 addt'l dose as needed for constipation  . famotidine (PEPCID) 20  MG tablet Take 1 tablet (20 mg total) by mouth 2 (two) times daily.  . fluticasone (FLONASE) 50 MCG/ACT nasal spray Place 1 spray into both nostrils 2 (two) times daily.  [name] gabapentin (NEURONTIN) 100 MG capsule Take 1  capsule (100 mg total) by mouth 3 (three) times daily.  [name] guaiFENesin (ROBITUSSIN) 100 MG/5ML SOLN Place 10 mLs (200 mg total) into feeding tube 4 (four) times daily.  . Hydrocortisone (GERHARDT'S BUTT CREAM) CREA Apply 1 application topically 4 (four) times daily.  [name] loratadine (CLARITIN) 10 MG tablet Place 1 tablet (10 mg total) into feeding tube daily as needed for allergies. (Patient taking differently: Place 10 mg into feeding tube every morning. )  . Melatonin 5 MG CAPS Place 5 mg into feeding tube every evening.   . Multiple Vitamin (MULTIVITAMIN) LIQD Place 15 mLs into feeding tube daily.  [name] nystatin (MYCOSTATIN/NYSTOP) powder Apply 1 g topically 2 (two) times daily as needed. (Patient taking differently: Apply 1 g topically 2 (two) times daily as needed (irritation). )  . potassium chloride 20 MEQ/15ML (10%) SOLN Place 15 mLs (20 mEq total) into feeding tube daily.  . Probiotic Product (PROBIOTIC ADVANCED) CAPS Take 1 capsule by mouth daily.  . prochlorperazine (COMPAZINE) 5 MG tablet Place 1-2 tablets (5-10 mg total) into feeding tube every 6 (six) hours as needed for nausea.  . simethicone (MYLICON) 40 MG/0.6ML drops Place 0.6 mLs (40 mg total) into feeding tube 4 (four) times daily as needed for flatulence.  [name] UNABLE TO FIND Med Name: Good Belly probiotic 5oz every morning  . valACYclovir (VALTREX) 500 MG tablet PLACE 1 TABLET INTO FEEDING TUBE DAILY  . Water For Irrigation, Sterile (FREE WATER) SOLN Place 150 mLs into feeding tube See admin instructions. Takes 5 times daily, if over 266ml-hold water and recheck within an hour    FAMILY HISTORY/SOCIAL HISTORY/REVIEW OF SYSTEMS:   Cannot obtain due to inability to communicate with the patient due to her baseline mental status  SUBJECTIVE:  As above  VITAL SIGNS: BP 133/89   Pulse 80   Temp 97.6 F (36.4 C) (Oral)   Resp (!) 23   LMP 12/01/2010   SpO2 95%   HEMODYNAMICS:    VENTILATOR SETTINGS:    INTAKE /  OUTPUT: No intake/output data recorded.  PHYSICAL EXAMINATION: General:  Chronically ill-appearing female Neuro:  Will open eyes occasionally make spontaneous movements, nonverbal, does not follow commands HEENT:  Dry mucous membranes, oral thrush noted, tracheostomy tube in place Cardiovascular:  Regular rate and rhythm no murmurs gallops or rubs Lungs:  Rhonchi with diminished breath sounds in the right lower lobe Abdomen:  Soft, nontender, PEG in place Musculoskeletal:  Diminished bulk and tone Skin:  No rash or skin breakdown  LABS:  BMET  Recent Labs Lab 09/24/16 1640  NA 131*  K 3.7  CL 94*  CO2 25  BUN 14  CREATININE 0.36*  GLUCOSE 98    Electrolytes  Recent Labs Lab 09/24/16 1640  CALCIUM 9.1    CBC  Recent Labs Lab 09/24/16 1640  WBC 9.9  HGB 13.3  HCT 39.3  PLT 325    Coag's No results for input(s): APTT, INR in the last 168 hours.  Sepsis Markers  Recent Labs Lab 09/24/16 1700  LATICACIDVEN 1.09    ABG No results for input(s): PHART, PCO2ART, PO2ART in the last 168 hours.  Liver Enzymes  Recent Labs Lab 09/24/16 1640  AST 56*  ALT 69*  ALKPHOS 180*  BILITOT 1.4*  ALBUMIN 3.2*    Cardiac Enzymes No results for input(s): TROPONINI, PROBNP in the last 168 hours.  Glucose No results for input(s): GLUCAP in the last 168 hours.  Imaging No results found.   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 She recently was treated with broad-spectrum antibiotics. She has healthcare associated pneumonia. She has had MRSA in the past.  ASSESSMENT /  PLAN:  PULMONARY A: MRSA tracheobronchitis versus pneumonia Tracheostomy status Heavy chest congestion P:   Frequent suctioning via respiratory therapy On Trach Trach collar oxygen Monitor O2 saturation  CARDIOVASCULAR A:  No acute issues P:  Monitor blood pressure every shift  RENAL A:   Mild hyponatremia Volume depletion P:   IV fluid with normal saline process sedation overnight Repeat basic metabolic panel in the morning Follow urine output  GASTROINTESTINAL A:   Constipation recently PEG tube dependent for nutrition P:   Continue Osmolite 24 hours a day Free water via tube Avoid MiraLAX Dulcolax when necessary  HEMATOLOGIC A:   No acute issues P:  Monitor for bleeding  INFECTIOUS A:   MRSA tracheobronchitis versus pneumonia P:   Start IV vancomycin, cover with ceftriaxone for now, follow up respiratory culture Probiotic We'll likely stop ceftriaxone in 24-48 hours  ENDOCRINE A:   No acute issues   P:   Monitor glucose  NEUROLOGIC A:   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

## 2016-09-25 DIAGNOSIS — R29898 Other symptoms and signs involving the musculoskeletal system: Secondary | ICD-10-CM

## 2016-09-25 DIAGNOSIS — J219 Acute bronchiolitis, unspecified: Secondary | ICD-10-CM

## 2016-09-25 LAB — BASIC METABOLIC PANEL
ANION GAP: 12 (ref 5–15)
BUN: 7 mg/dL (ref 6–20)
CALCIUM: 8.9 mg/dL (ref 8.9–10.3)
CO2: 25 mmol/L (ref 22–32)
CREATININE: 0.35 mg/dL — AB (ref 0.44–1.00)
Chloride: 98 mmol/L — ABNORMAL LOW (ref 101–111)
Glucose, Bld: 137 mg/dL — ABNORMAL HIGH (ref 65–99)
Potassium: 3.4 mmol/L — ABNORMAL LOW (ref 3.5–5.1)
SODIUM: 135 mmol/L (ref 135–145)

## 2016-09-25 LAB — MRSA PCR SCREENING: MRSA BY PCR: POSITIVE — AB

## 2016-09-25 LAB — CBC
HCT: 39.4 % (ref 36.0–46.0)
HEMOGLOBIN: 13.2 g/dL (ref 12.0–15.0)
MCH: 28.6 pg (ref 26.0–34.0)
MCHC: 33.5 g/dL (ref 30.0–36.0)
MCV: 85.5 fL (ref 78.0–100.0)
PLATELETS: 318 10*3/uL (ref 150–400)
RBC: 4.61 MIL/uL (ref 3.87–5.11)
RDW: 13.1 % (ref 11.5–15.5)
WBC: 8.4 10*3/uL (ref 4.0–10.5)

## 2016-09-25 MED ORDER — CHLORHEXIDINE GLUCONATE CLOTH 2 % EX PADS
6.0000 | MEDICATED_PAD | Freq: Every day | CUTANEOUS | Status: AC
  Administered 2016-09-27 – 2016-09-29 (×3): 6 via TOPICAL

## 2016-09-25 MED ORDER — DIAZEPAM 5 MG/ML PO CONC: 2.0000 mg | Freq: Every day | ORAL | Status: DC

## 2016-09-25 MED ORDER — DIAZEPAM 2 MG PO TABS
2.0000 mg | ORAL_TABLET | Freq: Every day | ORAL | Status: DC
  Administered 2016-09-25 – 2016-09-29 (×5): 2 mg
  Filled 2016-09-25 (×5): qty 1

## 2016-09-25 MED ORDER — AMLODIPINE 1 MG/ML ORAL SUSPENSION
5.0000 mg | Freq: Every day | ORAL | Status: DC
Start: 2016-09-25 — End: 2016-09-28
  Administered 2016-09-25 – 2016-09-28 (×4): 5 mg
  Filled 2016-09-25 (×5): qty 5

## 2016-09-25 MED ORDER — AMANTADINE HCL 50 MG/5ML PO SYRP
100.0000 mg | ORAL_SOLUTION | Freq: Two times a day (BID) | ORAL | Status: DC
Start: 2016-09-25 — End: 2016-09-30
  Administered 2016-09-25 – 2016-09-30 (×10): 100 mg
  Filled 2016-09-25 (×11): qty 10

## 2016-09-25 MED ORDER — BACLOFEN 1 MG/ML ORAL SUSPENSION
10.0000 mg | Freq: Four times a day (QID) | ORAL | Status: DC
  Administered 2016-09-25 – 2016-09-30 (×20): 10 mg
  Filled 2016-09-25 (×22): qty 1

## 2016-09-25 MED ORDER — GABAPENTIN 250 MG/5ML PO SOLN
100.0000 mg | Freq: Three times a day (TID) | ORAL | Status: DC
  Administered 2016-09-25 – 2016-09-26 (×4): 100 mg
  Filled 2016-09-25 (×5): qty 2

## 2016-09-25 MED ORDER — MUPIROCIN 2 % EX OINT
1.0000 "application " | TOPICAL_OINTMENT | Freq: Two times a day (BID) | CUTANEOUS | Status: AC
  Administered 2016-09-25 – 2016-09-29 (×10): 1 via NASAL
  Filled 2016-09-25 (×2): qty 22

## 2016-09-25 MED ORDER — ACETAMINOPHEN 325 MG PO TABS
650.0000 mg | ORAL_TABLET | Freq: Four times a day (QID) | ORAL | Status: DC | PRN
  Administered 2016-09-25: 650 mg
  Filled 2016-09-25: qty 2

## 2016-09-25 NOTE — Progress Notes (Signed)
PULMONARY / CRITICAL CARE MEDICINE   Name: Judy Spears MRN: [id number] DOB: 06/16/1950    ADMISSION DATE:  09/24/2016 CONSULTATION DATE:  09/24/2016  REFERRING MD:  Dr. Regenia Skeeter  CHIEF COMPLAINT:  Shortness of breath  HISTORY OF PRESENT ILLNESS:   66 year old female who is my office patient presents today with 1 week of increasing mucus production, lethargy, and low-grade fever. 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 Despite this occurring in late 2016, early 2017 she was able to make a significant recovery to be able to ambulate with assistance. Her course since then has been complicated by episodes of pneumonia, lengthy hospitalizations for the same with rehabilitation afterwards. She's also had C. difficile. 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  Approximately 6-8 weeks ago she was diagnosed with breast cancer. She had a lumpectomy performed. She was treated with anastrozole. She had some notable weakness after this. Since then she has had difficulty with ambulation.  Approximately 2-1/2-3 weeks ago she developed increasing cough with mucus production. She was treated with Levaquin. She did have some improvement and mucus production but the Levaquin stopped about a week ago. Not long after that she started to have increasing mucus production, lethargy, and a low-grade fever. She's required frequent suctioning throughout the course of the week. Based on communications we've had with the family we recommended that she come to be evaluated in the emergency department.  PAST MEDICAL HISTORY :  She  has a past medical history of Arthritis; Complication of anesthesia; Hair loss; History of exercise stress test; Hyperlipidemia; Insomnia; Migraine headache; Movement disorder; Postmenopausal; [place] encephalitis; and Premature atrial contractions.  PAST SURGICAL HISTORY: She  has a past surgical history  that includes Hernia repair (2000); Rhinoplasty (1983); Cesarean section (1986); opirectinal membrane peel; cataracts right eye; Breast biopsy (01/01/2011); Breast biopsy (Right, 07/27/2012); Breast biopsy (Right, 07/07/2016); Breast biopsy (Left, 06/30/2016); Tracheostomy; and PEG placement.  Allergies  Allergen Reactions  . Cefepime Other (See Comments)    Status epilepticus- this reaction is questionable per husband.States that it was determined that pt did not have seizure activity. Status epilepticus  . Tetracyclines & Related Photosensitivity and Other (See Comments)    Blisters  . Doxycycline Monohydrate     Blisters, photosensitivity- family state that the reaction is actually to tetracycline.  . Pollen Extract Other (See Comments)    Running nose  . Tetracycline Other (See Comments) and Photosensitivity    Blisters as a child.    No current facility-administered medications on file prior to encounter.    Current Outpatient Prescriptions on File Prior to Encounter  Medication Sig  . amantadine (SYMMETREL) 50 MG/5ML solution Place 10 mLs (100 mg total) into feeding tube 2 (two) times daily. In am and at noon (Patient taking differently: Place 150 mg into feeding tube 2 (two) times daily. In am and at noon )  . amLODipine (NORVASC) 5 MG tablet PLACE ONE TABLET INTO FEEDING TUBE DAILY  . atorvastatin (LIPITOR) 20 MG tablet Place 20 mg into feeding tube at bedtime.   . baclofen (LIORESAL) 10 MG tablet Place 1 tablet (10 mg total) into feeding tube every 6 (six) hours. (Patient taking differently: Place 10 mg into feeding tube every 6 (six) hours. 8am 12noon 8 pm)  . chlorhexidine gluconate, MEDLINE KIT, (PERIDEX) 0.12 % solution 15 mLs by Mouth Rinse route 2 (two) times daily. (Patient taking differently: 5 mLs by Mouth Rinse route  2 (two) times daily. )  . Coenzyme Q10 (COQ10 PO) Take 10 mLs by mouth at bedtime.  [name] CRANBERRY JUICE EXTRACT PO Take by mouth daily. In the morning  .  diazepam (VALIUM) 2 MG tablet PLACE 1 TABLET INTO FEEDING TUBE AT BEDTIME (Patient taking differently: PLACE 2 mg TABLET INTO FEEDING TUBE AT BEDTIME)  . docusate (COLACE) 60 MG/15ML syrup Place 100 mg into feeding tube 2 (two) times daily. Takes twice a Judy but can take 1 addt'l dose as needed for constipation  . famotidine (PEPCID) 20 MG tablet Take 1 tablet (20 mg total) by mouth 2 (two) times daily.  . fluticasone (FLONASE) 50 MCG/ACT nasal spray Place 1 spray into both nostrils 2 (two) times daily.  [name] gabapentin (NEURONTIN) 100 MG capsule Take 1 capsule (100 mg total) by mouth 3 (three) times daily. (Patient taking differently: Take 100 mg by mouth 3 (three) times daily. Feeding tube)  . guaiFENesin (ROBITUSSIN) 100 MG/5ML SOLN Place 10 mLs (200 mg total) into feeding tube 4 (four) times daily. (Patient taking differently: Place 10 mLs into feeding tube as needed for to loosen phlegm. )  . loratadine (CLARITIN) 10 MG tablet Place 1 tablet (10 mg total) into feeding tube daily as needed for allergies. (Patient taking differently: Place 10 mg into feeding tube every morning. daily)  . Melatonin 5 MG CAPS Place 5 mg into feeding tube every evening.   . Multiple Vitamin (MULTIVITAMIN) LIQD Place 15 mLs into feeding tube daily.  [name] nystatin (MYCOSTATIN/NYSTOP) powder Apply 1 g topically 2 (two) times daily as needed. (Patient taking differently: Apply 1 g topically 2 (two) times daily as needed (irritation). )  . potassium chloride 20 MEQ/15ML (10%) SOLN Place 15 mLs (20 mEq total) into feeding tube daily.  . Probiotic Product (PROBIOTIC ADVANCED) CAPS Take 1 capsule by mouth daily.  . prochlorperazine (COMPAZINE) 5 MG tablet Place 1-2 tablets (5-10 mg total) into feeding tube every 6 (six) hours as needed for nausea.  . simethicone (MYLICON) 40 FK/8.1EX drops Place 0.6 mLs (40 mg total) into feeding tube 4 (four) times daily as needed for flatulence.  [name] UNABLE TO FIND Med Name: Good Belly probiotic 5oz  every morning  . valACYclovir (VALTREX) 500 MG tablet PLACE 1 TABLET INTO FEEDING TUBE DAILY (Patient taking differently: PLACE 500 mg TABLET INTO FEEDING TUBE DAILY)  . Water For Irrigation, Sterile (FREE WATER) SOLN Place 150 mLs into feeding tube See admin instructions. Takes 133m 5 times daily, if over 2051mhold water and recheck within an hour  . Hydrocortisone (GERHARDT'S BUTT CREAM) CREA Apply 1 application topically 4 (four) times daily. (Patient taking differently: Apply 1 application topically as needed for irritation. Rear end)    FAMILY HISTORY/SOCIAL HISTORY/REVIEW OF SYSTEMS:   Cannot obtain due to inability to communicate with the patient due to her baseline mental status  SUBJECTIVE:  As above  VITAL SIGNS: BP (!) 168/86 (BP Location: Right Arm)   Pulse 91   Temp 98.3 F (36.8 C)   Resp 20   Wt 96 lb 6.4 oz (43.7 kg)   LMP 12/01/2010   SpO2 97%   BMI 16.55 kg/m   HEMODYNAMICS:    VENTILATOR SETTINGS: FiO2 (%):  [35 %-60 %] 35 %  INTAKE / OUTPUT: I/O last 3 completed shifts: In: 1100 [IV Piggyback:1100] Out: -   PHYSICAL EXAMINATION: General appearance:  [age]ear old female cachectic  NAD, currently in acute distress,attempts to be conversant  Eyes: anicteric sclerae, moist  conjunctivae; PERRL, EOMI bilaterally. Mouth:  Dry membranes and no mucosal ulcerations; normal hard and soft palate Neck: Trachea midline; neck supple, no JVD Lungs/chest: scattered rhonchi, with normal respiratory effort and no intercostal retractions very poor cough CV: RRR, no MRGs  Abdomen: Soft, non-tender; no masses or HSM Extremities: No peripheral edema or extremity lymphadenopathy Skin: Normal temperature, turgor and texture; no rash, ulcers or subcutaneous nodules Psych: awake, cooperative very weak, attempts to communicated   LABS:  BMET  Recent Labs Lab 09/24/16 1640 09/25/16 0559  NA 131* 135  K 3.7 3.4*  CL 94* 98*  CO2 25 25  BUN 14 7  CREATININE 0.36*  0.35*  GLUCOSE 98 137*    Electrolytes  Recent Labs Lab 09/24/16 1640 09/25/16 0559  CALCIUM 9.1 8.9    CBC  Recent Labs Lab 09/24/16 1640 09/25/16 0559  WBC 9.9 8.4  HGB 13.3 13.2  HCT 39.3 39.4  PLT 325 318    Coag's  Recent Labs Lab 09/24/16 2128  INR 1.17    Sepsis Markers  Recent Labs Lab 09/24/16 1700  LATICACIDVEN 1.09    ABG No results for input(s): PHART, PCO2ART, PO2ART in the last 168 hours.  Liver Enzymes  Recent Labs Lab 09/24/16 1640  AST 56*  ALT 69*  ALKPHOS 180*  BILITOT 1.4*  ALBUMIN 3.2*    Cardiac Enzymes No results for input(s): TROPONINI, PROBNP in the last 168 hours.  Glucose No results for input(s): GLUCAP in the last 168 hours.  Imaging Dg Chest Port 1 View  Result Date: 09/24/2016 CLINICAL DATA:  Low-grade fever and excess secretions. EXAM: PORTABLE CHEST 1 VIEW COMPARISON:  09/08/2016. FINDINGS: Stable normal sized heart. No significant change in linear density at the right lung base. Interval minimal linear density on the left. Poor inspiration. Diffuse osteopenia. Tracheostomy tube in satisfactory position. IMPRESSION: 1. Poor inspiration with interval minimal linear atelectasis on the left. 2. Stable right basilar linear atelectasis or scarring. Electronically Signed   By: Claudie Revering M.D.   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 She recently was treated with broad-spectrum antibiotics. She has healthcare associated pneumonia. She has had MRSA in the past.  ASSESSMENT / PLAN: Baseline encephalopathy secondary  to tickborne illness Muscular deconditioning Plan PT consult over weekend if her condition improves May need repeat neuro eval    MRSA tracheobronchitis versus pneumonia Tracheostomy status Heavy chest congestion Plan:   Cont aggressive pulm hygiene Wean oxygen  PRN suction Cont vanc and levaquin Judy 2   Mild hyponatremia Volume depletion Plan Cont IVFs Repeat am chem  Strict I&O    Constipation recently PEG tube dependent for nutrition P:   cpnt tube feeds Cont free water No miralax    .Erick Colace ACNP-BC [place] Pager # [phone] OR # [phone] if no answer   09/25/2016, 3:57 PM

## 2016-09-25 NOTE — Progress Notes (Signed)
Initial Nutrition Assessment  DOCUMENTATION CODES:   Severe malnutrition in context of chronic illness  INTERVENTION:  Recommend increase osmolite 1.5 to 32mL/hr providing 1620 calories, 68 grams of protein, and 837mL free water  Recommend Free water 236mL/hr 4 times daily - provides 1636mL free water total  Recommend patient return home on 70mL/hr of Osmolite 1.5 over 14 hours, providing 1785 calories, 75 grams protein, 972mL free water  Recommend speech evaluation for appropriate diet and liquid consistency.  Recommend replete potassium (3.4)  NUTRITION DIAGNOSIS:   Malnutrition (Severe) related to chronic illness (tickborne encephelopathy) as evidenced by percent weight loss.  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   Spoke with patient's Husband at bedside. She is unable to provide any history. He states that currently she is having no issues tolerating tubefeed infusing at 30mL/hr normally runs at 48mL/hr for 14 hours at home. She has been losing weight, he has been unable to weigh her but from chart she exhibits a 21 pound/18% severe wt loss over 8 months. Husband was unsure of why she has been losing weight. Patient underwent a lumpectomy 7/10 at [facility]. Between underlying cancer and being wheelchair bound, suspect she exhibits muscle atrophy and increased calorie needs. Husband increased the tubefeeding to 163mLhr x14 hours at one point then she became constipated. Had multiple bouts of diarrhea after an enema per Husband. Her abdomen seemed distended but not firm today. Had  another bowel movement during my visit.  She continues to exhibit moderate-severe muscle wasting and fat depletions all over. Husband has been giving her liquids by mouth. She is currently not on a diet, needs speech evaluation.  Labs reviewed:  K 3.4, Elevated LFTs, TBili 1.4  Medications reviewed and include:  NS at 38mL/hr   Diet Order:     Skin:  Reviewed, no issues  Last BM:  09/24/2016  Height:   Ht Readings from Last 1 Encounters:  07/14/16 5\' 4"  (1.626 m)    Weight:   Wt Readings from Last 1 Encounters:  09/25/16 96 lb 6.4 oz (43.7 kg)    Ideal Body Weight:  54.54 kg  BMI:  Body mass index is 16.55 kg/m.  Estimated Nutritional Needs:   Kcal:  1527-1745 calories (35-40 cal/kg)  Protein:  65-76 grams (1.5-1.75g/kg)  Fluid:  1.5-1.7L  EDUCATION NEEDS:   Education needs addressed  Satira Anis. Jahziah Simonin, MS, RD LDN Inpatient Clinical Dietitian Pager [phone]

## 2016-09-26 ENCOUNTER — Inpatient Hospital Stay (HOSPITAL_COMMUNITY): Payer: Medicare Other

## 2016-09-26 DIAGNOSIS — J9811 Atelectasis: Secondary | ICD-10-CM

## 2016-09-26 DIAGNOSIS — A4902 Methicillin resistant Staphylococcus aureus infection, unspecified site: Secondary | ICD-10-CM

## 2016-09-26 DIAGNOSIS — R252 Cramp and spasm: Secondary | ICD-10-CM

## 2016-09-26 LAB — CULTURE, RESPIRATORY W GRAM STAIN

## 2016-09-26 LAB — URINE CULTURE: Culture: NO GROWTH

## 2016-09-26 LAB — CULTURE, RESPIRATORY

## 2016-09-26 MED ORDER — VANCOMYCIN HCL 500 MG IV SOLR
500.0000 mg | Freq: Three times a day (TID) | INTRAVENOUS | Status: DC
  Administered 2016-09-26 – 2016-09-28 (×5): 500 mg via INTRAVENOUS
  Filled 2016-09-26 (×7): qty 500

## 2016-09-26 MED ORDER — GUAIFENESIN 100 MG/5ML PO SOLN
10.0000 mL | Freq: Four times a day (QID) | ORAL | Status: DC
  Administered 2016-09-26 – 2016-09-30 (×15): 200 mg via ORAL
  Filled 2016-09-26 (×3): qty 10
  Filled 2016-09-26: qty 5
  Filled 2016-09-26 (×5): qty 10
  Filled 2016-09-26: qty 5
  Filled 2016-09-26 (×2): qty 10
  Filled 2016-09-26: qty 25
  Filled 2016-09-26 (×3): qty 10

## 2016-09-26 NOTE — Progress Notes (Signed)
PULMONARY / CRITICAL CARE MEDICINE   Name: Judy Spears MRN: [id number] DOB: July 09, 1950   11 ADMISSION DATE:  09/24/2016 CONSULTATION DATE:  09/24/2016  REFERRING MD:  Dr. Regenia Skeeter  CHIEF COMPLAINT:  Shortness of breath  HISTORY OF PRESENT ILLNESS:   66 year old female who is my office patient presents today with 1 week of increasing mucus production, lethargy, and low-grade fever. 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 Despite this occurring in late 2016, early 2017 she was able to make a significant recovery to be able to ambulate with assistance. Her course since then has been complicated by episodes of pneumonia, lengthy hospitalizations for the same with rehabilitation afterwards. She's also had C. difficile. 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  Approximately 6-8 weeks ago she was diagnosed with breast cancer. She had a lumpectomy performed. She was treated with anastrozole. She had some notable weakness after this. Since then she has had difficulty with ambulation.  Approximately 2-1/2-3 weeks ago she developed increasing cough with mucus production. She was treated with Levaquin. She did have some improvement and mucus production but the Levaquin stopped about a week ago. Not long after that she started to have increasing mucus production, lethargy, and a low-grade fever. She's required frequent suctioning throughout the course of the week. Based on communications we've had with the family we recommended that she come to be evaluated in the emergency department.  SUBJECTIVE:  About the same   VITAL SIGNS: BP 133/84   Pulse (!) 105   Temp 97.6 F (36.4 C) (Axillary)   Resp (!) 22   Wt 95 lb (43.1 kg)   LMP 12/01/2010   SpO2 100%   BMI 16.31 kg/m   HEMODYNAMICS:    VENTILATOR SETTINGS: FiO2 (%):  [35 %-40 %] 35 %  INTAKE / OUTPUT: I/O last 3 completed shifts: In: 5706.3  [I.V.:2636.3; NG/GT:1670; IV Piggyback:1400] Out: 1200 [Urine:1200]  PHYSICAL EXAMINATION: General appearance:  66 Year old  female, cachectic  NAD, interactive. Occasionally pin rolling and restless Eyes: anicteric sclerae , moist conjunctivae; PERRL, EOMI bilaterally. Mouth:  membranes are dry and no mucosal ulcerations; normal hard and soft palate Neck: Trachea midline; neck supple, no JVD, # 4 trach is unremarkable  Lungs/chest: diffuse rhonchi, with normal respiratory effort and no intercostal retractions CV: RRR, no MRGs  Abdomen: Soft, non-tender; no masses or HSM Extremities: No peripheral edema or extremity lymphadenopathy, starting to have contractures Skin: Normal temperature, turgor and texture; no rash, ulcers or subcutaneous nodules Psych: awake, interactive. Not verbal;   LABS:  BMET  Recent Labs Lab 09/24/16 1640 09/25/16 0559  NA 131* 135  K 3.7 3.4*  CL 94* 98*  CO2 25 25  BUN 14 7  CREATININE 0.36* 0.35*  GLUCOSE 98 137*    Electrolytes  Recent Labs Lab 09/24/16 1640 09/25/16 0559  CALCIUM 9.1 8.9    CBC  Recent Labs Lab 09/24/16 1640 09/25/16 0559  WBC 9.9 8.4  HGB 13.3 13.2  HCT 39.3 39.4  PLT 325 318    Coag's  Recent Labs Lab 09/24/16 2128  INR 1.17    Sepsis Markers  Recent Labs Lab 09/24/16 1700  LATICACIDVEN 1.09    ABG No results for input(s): PHART, PCO2ART, PO2ART in the last 168 hours.  Liver Enzymes  Recent Labs Lab 09/24/16 1640  AST 56*  ALT 69*  ALKPHOS 180*  BILITOT 1.4*  ALBUMIN 3.2*  Cardiac Enzymes No results for input(s): TROPONINI, PROBNP in the last 168 hours.  Glucose No results for input(s): GLUCAP in the last 168 hours.  Imaging Dg Chest Port 1 View  Result Date: 09/26/2016 CLINICAL DATA:  Chronic ventilator dependent respiratory failure. Follow-up basilar atelectasis. EXAM: PORTABLE CHEST 1 VIEW COMPARISON:  09/24/2016, 09/08/2016 and earlier. FINDINGS: Tracheostomy tube in  satisfactory position with its tip below the thoracic inlet, unchanged. Suboptimal inspiration. Cardiac silhouette upper normal in size to slightly enlarged for AP portable technique, unchanged. Worsening aeration in the left lower lobe with silhouetting of the left hemidiaphragm. Stable linear atelectasis or scarring at the right lung base dating back to at least January, 2018. No new pulmonary parenchymal abnormalities elsewhere. IMPRESSION: 1.  Support apparatus satisfactory. 2. Worsening aeration in the left lower lobe indicating worsening atelectasis and/or pneumonia. 3. Stable atelectasis or scar at the right lung base. 4. No new abnormalities elsewhere in either lung. Electronically Signed   By: Evangeline Dakin M.D.   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 She recently was treated with broad-spectrum antibiotics. She has healthcare associated pneumonia. She has had MRSA in the past.  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 Plan Continue home medications PT consult Supportive care  Hypertension His was likely secondary to withdrawal from her home meds.her blood pressure looks much better. Plan: Continue supportive care   MRSA tracheobronchitis versus pneumonia Tracheostomy status Heavy chest congestion p CXR personally review:some decreased aeration in the left base This could reflect worsening  atelectasis. Still has plate like Atelectasis on right.  Plan:   Continue supplemental oxygen When necessary suction Discontinue Levaquin, continue vancomycin.  Urinary retention Plan I&O catheterization Continue intake and output monitoring  Mild hyponatremia Volume depletion Plan Continue gentle hydration Strict I and O Repeat a.m. chemistry  Constipation recently PEG tube dependent for nutrition P:   Continue tube feeds Continue free water No MiraLAX Diet as able.   Marland KitchenErick Colace ACNP-BC [place] Pager # [phone] OR # [phone] if no answer   09/26/2016, 1:57 PM

## 2016-09-26 NOTE — Progress Notes (Signed)
IR consulted for gastrostomy tube evaluation at the request of patient's husband.  Discussed with unit RN.  Patient currently has gastrostomy in place that is working without issue. TF currently infusing.  Will continue to work towards assessment and plans for possible exchange if needed. This may be arranged as a outpatient if patient becomes stable for discharge.  Brynda Greathouse, MS RD PA-C 2:32 PM

## 2016-09-27 DIAGNOSIS — J9612 Chronic respiratory failure with hypercapnia: Secondary | ICD-10-CM

## 2016-09-27 LAB — GLUCOSE, CAPILLARY: Glucose-Capillary: 139 mg/dL — ABNORMAL HIGH (ref 65–99)

## 2016-09-27 LAB — BASIC METABOLIC PANEL
ANION GAP: 8 (ref 5–15)
BUN: 8 mg/dL (ref 6–20)
CALCIUM: 8.6 mg/dL — AB (ref 8.9–10.3)
CO2: 30 mmol/L (ref 22–32)
Chloride: 96 mmol/L — ABNORMAL LOW (ref 101–111)
Creatinine, Ser: 0.33 mg/dL — ABNORMAL LOW (ref 0.44–1.00)
Glucose, Bld: 130 mg/dL — ABNORMAL HIGH (ref 65–99)
POTASSIUM: 2.7 mmol/L — AB (ref 3.5–5.1)
SODIUM: 134 mmol/L — AB (ref 135–145)

## 2016-09-27 MED ORDER — POTASSIUM CHLORIDE 10 MEQ/100ML IV SOLN
10.0000 meq | INTRAVENOUS | Status: AC
  Administered 2016-09-27 (×6): 10 meq via INTRAVENOUS
  Filled 2016-09-27 (×6): qty 100

## 2016-09-27 MED ORDER — POTASSIUM CHLORIDE 20 MEQ/15ML (10%) PO SOLN
40.0000 meq | Freq: Once | ORAL | Status: AC
  Administered 2016-09-27: 40 meq
  Filled 2016-09-27: qty 30

## 2016-09-27 NOTE — Progress Notes (Signed)
PULMONARY / CRITICAL CARE MEDICINE   Name: Judy Spears MRN: [id number] DOB: 16-Oct-1950   11 ADMISSION DATE:  09/24/2016 CONSULTATION DATE:  09/24/2016  REFERRING MD:  Dr. Regenia Skeeter  CHIEF COMPLAINT:  Shortness of breath  HISTORY OF PRESENT ILLNESS:   66 year old female who is my office patient presents today with 1 week of increasing mucus production, lethargy, and low-grade fever. 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 Despite this occurring in late 2016, early 2017 she was able to make a significant recovery to be able to ambulate with assistance. Her course since then has been complicated by episodes of pneumonia, lengthy hospitalizations for the same with rehabilitation afterwards. She's also had C. difficile. 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  Approximately 6-8 weeks ago she was diagnosed with breast cancer. She had a lumpectomy performed. She was treated with anastrozole. She had some notable weakness after this. Since then she has had difficulty with ambulation.  Approximately 2-1/2-3 weeks ago she developed increasing cough with mucus production. She was treated with Levaquin. She did have some improvement and mucus production but the Levaquin stopped about a week ago. Not long after that she started to have increasing mucus production, lethargy, and a low-grade fever. She's required frequent suctioning throughout the course of the week. Based on communications we've had with the family we recommended that she come to be evaluated in the emergency department.  SUBJECTIVE:  No acute issues overnight, did have approximately 1 L urinary retention earlier. Neurologically she is still withdrawn, seems to have very brief attention. VITAL SIGNS: BP 133/78 (BP Location: Left Arm)   Pulse (!) 102   Temp 99.8 F (37.7 C) (Oral)   Resp (!) 22   Wt 101 lb (45.8 kg)   LMP 12/01/2010   SpO2 96%    BMI 17.34 kg/m   HEMODYNAMICS:    VENTILATOR SETTINGS: FiO2 (%):  [35 %] 35 %  INTAKE / OUTPUT: I/O last 3 completed shifts: In: 1826 [I.V.:930; NG/GT:496; IV Piggyback:400] Out: 1250 [Urine:1250]  PHYSICAL EXAMINATION: Gen.: 66 year old female, lying comfortably in bed husband at bedside. Frail, cachectic. HEENT: #4 trach, couplets, no significant tracheal secretions. Pulmonary: Scattered rhonchi, shallow respiratory effort. No significant accessory use. Requiring less suction. Cardiac: Regular rate and rhythm without murmur rub or gallop. Abdomen: PEG unremarkable. Positive bowel sounds. Extremities: No edema, good pulses, warm, brisk cap refill. Neuro: Awake, inattentive, persistent pain rolling of hands, profound weakness.  LABS:  BMET  Recent Labs Lab 09/24/16 1640 09/25/16 0559 09/27/16 0325  NA 131* 135 134*  K 3.7 3.4* 2.7*  CL 94* 98* 96*  CO2 25 25 30   BUN 14 7 8   CREATININE 0.36* 0.35* 0.33*  GLUCOSE 98 137* 130*    Electrolytes  Recent Labs Lab 09/24/16 1640 09/25/16 0559 09/27/16 0325  CALCIUM 9.1 8.9 8.6*    CBC  Recent Labs Lab 09/24/16 1640 09/25/16 0559  WBC 9.9 8.4  HGB 13.3 13.2  HCT 39.3 39.4  PLT 325 318    Coag's  Recent Labs Lab 09/24/16 2128  INR 1.17    Sepsis Markers  Recent Labs Lab 09/24/16 1700  LATICACIDVEN 1.09    ABG No results for input(s): PHART, PCO2ART, PO2ART in the last 168 hours.  Liver Enzymes  Recent Labs Lab 09/24/16 1640  AST 56*  ALT 69*  ALKPHOS 180*  BILITOT 1.4*  ALBUMIN 3.2*    Cardiac Enzymes No  results for input(s): TROPONINI, PROBNP in the last 168 hours.  Glucose  Recent Labs Lab 09/27/16 0814  GLUCAP 139*    Imaging No results found.   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  Treating currently for MRSA tracheobronchitis, superimposed on severe failure to thrive. This is resulted in for cough mechanics as well as mucus plugging. From an acute standpoint I think her infection is addressed. She's been seen by physical therapy as well as rehabilitation services, agree that she is ready for rehabilitation bed when a bed is available.  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 See rehabilitation admissions coordinator, as well as physical therapy. Both recommending C IR. Plan Continue home medication Continue physical therapy services Physical rehabilitation consult.  Hypertension His was likely secondary to withdrawal from her home meds.her blood pressure looks much better. Plan: Cont supportive care   MRSA tracheobronchitis versus pneumonia Tracheostomy status Heavy chest congestion p CXR personally review:some decreased aeration in the left base This could reflect worsening atelectasis. Still has plate like Atelectasis on right.  Plan:   Continue supplemental oxygen Suction as needed Continue vancomycin   Urinary retention Plan Place Foley catheter  Mild hyponatremia Volume depletion Hypokalemia  Plan KVO IV fluids Replace potassium A.m. Chemistry. Interventional radiology consult for possible PEG exchange  Constipation recently PEG tube dependent for nutrition P:   Continue tube feeds Continue free water No MiraLAX Diet as tolerated.   Marland KitchenErick Colace ACNP-BC [place] Pager # [phone] OR # [phone] if no answer   09/27/2016, 2:05  PM

## 2016-09-27 NOTE — Evaluation (Signed)
Occupational Therapy Evaluation Patient Details Name: Judy Spears MRN: [id number] DOB: Feb 01, 1950 Today's Date: 09/27/2016    History of Present Illness Pt is a 66 y/o female admitted secondary to respiratory insufficiency. 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 Other PMH includes breast cancer s/p lumpectomy, and migraine.    Clinical Impression   This 66 yo female admitted with above presents to acute OT with deficits below (see OT problem list) thus affecting what her husband reports as of 2 months ago she was ambulatory with a RW with A and doing part of her bathing. She now is total A for ambulation and basic ADLs. She will benefit from acute OT with follow up OT on CIR to work back towards PLOF.    Follow Up Recommendations  CIR;Supervision/Assistance - 24 hour    Equipment Recommendations  None recommended by OT       Precautions / Restrictions Precautions Precautions: Fall Precaution Comments: Watch O2 sats  Restrictions Weight Bearing Restrictions: No      Mobility Bed Mobility Overal bed mobility: Needs Assistance Bed Mobility: Rolling Rolling: Max assist (with increased time and cues for sequencing)                   ADL either performed or assessed with clinical judgement   ADL Overall ADL's : Needs assistance/impaired Eating/Feeding: NPO                                     General ADL Comments: currently total A for all basic ADLs, but not so 2 months ago.     Vision Patient Visual Report: No change from baseline (pt with tendency with neck extension in bed; she will regard me talking to her with time and can move her eyes left, right, up, down)              Pertinent Vitals/Pain Pain Assessment: Faces Faces Pain Scale: Hurts little more Pain Location: LLE and LUE with PROM (contracted) Pain Descriptors / Indicators: Grimacing;Guarding Pain Intervention(s): Limited activity  within patient's tolerance;Monitored during session;Repositioned     Hand Dominance Right   Extremity/Trunk Assessment Upper Extremity Assessment Upper Extremity Assessment: LUE deficits/detail;RUE deficits/detail RUE Deficits / Details: continously fidgets/moves digits; guarding movements with AAROM/PROM (do not think it is in response to pain--but rather movements she cannot control); husband reports she has a hand splint at night  RUE Coordination: decreased fine motor;decreased gross motor LUE Deficits / Details: continously fidgets/moves digits; guarding movements with AAROM/PROM (may due to pain for elbow ROM due to contractures, but other guarding not necessarily due pain; husband reports she has a JAS splint that she should wear 3x/day for 20 minutes and a resting elbow splint to wear at night LUE Coordination: decreased fine motor;decreased gross motor           Communication Communication Communication: Tracheostomy   Cognition Arousal/Alertness: Awake/alert Behavior During Therapy: WFL for tasks assessed/performed Overall Cognitive Status: Within Functional Limits for tasks assessed                                 General Comments: slow reponse time to commands              Home Living Family/patient expects to be discharged to:: Inpatient rehab Living Arrangements: Spouse/significant other Available  Help at Discharge: Family;Personal care attendant;Available 24 hours/day Type of Home: House Home Access: Ramped entrance     Home Layout: Two level;Bed/bath upstairs Alternate Level Stairs-Number of Steps: chair lift   Bathroom Shower/Tub: Hospital doctor Toilet: Handicapped height     Home Equipment: Hospital bed;Wheelchair - manual;Tub bench;Hand held shower head;Bedside commode;Walker - 2 wheels (shower W/C, Stedy)   Additional Comments: Pt's husband provided all info about home       Prior Functioning/Environment Level of  Independence: Needs assistance  Gait / Transfers Assistance Needed: Had been using stedy the last couple of months to get to [facility] as pt has declined.  ADL's / Homemaking Assistance Needed: max assist with bathing, dressing; but up until 2 months ago she was washing her whole UB and thighs with setup/S Communication / Swallowing Assistance Needed: pt did shake her head yes and mouthed "hello" to me          OT Problem List: Decreased strength;Decreased range of motion;Impaired balance (sitting and/or standing);Impaired UE functional use;Pain;Impaired tone;Decreased coordination      OT Treatment/Interventions: Self-care/ADL training;Splinting;Therapeutic activities;Therapeutic exercise;Patient/family education;Balance training    OT Goals(Current goals can be found in the care plan section) Acute Rehab OT Goals Patient Stated Goal: unable to state  OT Goal Formulation: With family Time For Goal Achievement: 10/11/16 Potential to Achieve Goals: Fair  OT Frequency: Min 3X/week              AM-PAC PT "6 Clicks" Daily Activity     Outcome Measure Help from another person eating meals?: Total Help from another person taking care of personal grooming?: Total Help from another person toileting, which includes using toliet, bedpan, or urinal?: Total Help from another person bathing (including washing, rinsing, drying)?: Total Help from another person to put on and taking off regular upper body clothing?: Total Help from another person to put on and taking off regular lower body clothing?: Total 6 Click Score: 6   End of Session Nurse Communication:  (palm guard and left soft elbow splint wear)  Activity Tolerance: Patient tolerated treatment well Patient left: in bed;with family/visitor present  OT Visit Diagnosis: Other abnormalities of gait and mobility (R26.89);Muscle weakness (generalized) (M62.81);Pain;Cognitive communication deficit (R41.841)                Time: 5397-6734 OT  Time Calculation (min): 50 min Charges:  OT Evaluation $OT Eval Moderate Complexity: 1 Mod OT Treatments $Self Care/Home Management : 8-22 mins $Therapeutic Activity: 8-22 mins $Prosthetics Training: 8-22 mins [place], OTR/L [phone] 09/27/2016

## 2016-09-27 NOTE — Progress Notes (Signed)
Patient not available for CPT at this time.

## 2016-09-27 NOTE — Progress Notes (Signed)
Repeat bladder scan showed around 81ml of urine. In and out cath done with Markus Jarvis RN.  Urine output was 1047ml.

## 2016-09-27 NOTE — Progress Notes (Addendum)
Rehab Admissions Coordinator Note:  Patient was screened by Cleatrice Burke for appropriateness for an Inpatient Acute Rehab Consul per PT recommendation. We know Mrs. Steiner from a previous CIR admission.   At this time, we are recommending Inpatient Rehab consult. Please place order for a consult for possible admission to inpt rehab. Dr. [name] sees patient as an outpatient.  Cleatrice Burke 09/27/2016, 11:29 AM  I can be reached at [phone].

## 2016-09-27 NOTE — Progress Notes (Signed)
[place] Progress Note Patient Name: Judy Spears DOB: [birth year] MRN: [id number]   Date of Service  09/27/2016  HPI/Events of Note  K+ = 2.7 and Creatinine = 0.33.  eICU Interventions  Will replace K+.      Intervention Category Major Interventions: Electrolyte abnormality - evaluation and management  Shirlee Whitmire [place] 09/27/2016, 5:06 AM

## 2016-09-27 NOTE — Progress Notes (Signed)
Per MD, foley placed for acute urinary retention. Also Stage 1 pressure ulcer noted to RT buttocks. Foam in place. Patient is now q 2 hour turns. Will continue to monitor.   Markus Jarvis, RN

## 2016-09-27 NOTE — Progress Notes (Signed)
MD paged regarding urinary retention. Bladder scan showed approximately 500 ml of urine. Pt has orders for in and out cath. RN requesting Foley but MD prefers to wait at this time. Will continue to monitor patient and observe for voiding.

## 2016-09-27 NOTE — Progress Notes (Signed)
Pharmacy Antibiotic Note  Judy Spears is a 66 y.o. female admitted on 09/24/2016 with pneumonia.  Pharmacy has been consulted for vancomycin dosing. Pt has historical vanc trough of 6 on 500mg  q 12h, frequency was increased to q8 on 9/2.  Plan: Vanc 500 mg q8h  Vanc level before 4th dose 9/3 Monitor renal fx, cx  Weight: 101 lb (45.8 kg)  Temp (24hrs), Avg:99 F (37.2 C), Min:97.7 F (36.5 C), Max:99.8 F (37.7 C)   Recent Labs Lab 09/24/16 1640 09/24/16 1700 09/25/16 0559 09/27/16 0325  WBC 9.9  --  8.4  --   CREATININE 0.36*  --  0.35* 0.33*  LATICACIDVEN  --  1.09  --   --     Estimated Creatinine Clearance: 50.7 mL/min (A) (by C-G formula based on SCr of 0.33 mg/dL (L)).    Allergies  Allergen Reactions  . Cefepime Other (See Comments)    Status epilepticus- this reaction is questionable per husband.States that it was determined that pt did not have seizure activity. Status epilepticus  . Tetracyclines & Related Photosensitivity and Other (See Comments)    Blisters  . Doxycycline Monohydrate     Blisters, photosensitivity- family state that the reaction is actually to tetracycline.  . Pollen Extract Other (See Comments)    Running nose  . Tetracycline Other (See Comments) and Photosensitivity    Blisters as a child.    Antimicrobials this admission: Lvq 8/31>>9/2 Vanc 8/31>>   Dose adjustments this admission vanc 1000mg  q24h> 500mg  q8h  Microbiology results: 8/31 blood x 2 >ngtd 8/31 urine > ngtd 8/31 resp > prelim few + cocci in pairs MRSA PCR nasal > +, mupirocin  Bertis Ruddy, PharmD Pharmacy Resident Pager #: [phone] 09/27/2016 7:49 AM

## 2016-09-27 NOTE — Evaluation (Signed)
Physical Therapy Evaluation Patient Details Name: Judy Spears MRN: [id number] DOB: Jul 26, 1950 Today's Date: 09/27/2016   History of Present Illness  Pt is a 66 y/o female admitted secondary to respiratory insufficiency. 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 Other PMH includes breast cancer s/p lumpectomy, and migraine.   Clinical Impression  Pt admitted secondary to problem above with deficits below. Per husband, pt has had significant decline in the past couple of months. Pt used to be able to walk with RW, now is requiring max A with use of stedy to transfer to [facility]. Upon eval, pt very guarded and demonstrating guarding in LLE. Pt requiring total assist +2 for bed mobility and for supine<>sit transfers. RN requesting to defer OOB mobility at this time, as pt may have to have in and out cath. Discussed with husband and he is interested in pursuing CIR at d/c. Feel this is appropriate at this time secondary to significant decline of function over past couple of months. Pt has extremely supportive husband and nurses available at d/c for 24/7 support. Will continue to follow to maximize functional mobility independence and safety.     Follow Up Recommendations CIR;Supervision/Assistance - 24 hour    Equipment Recommendations  Other (comment) (Hoyer lift and pad )    Recommendations for Other Services OT consult;Rehab consult     Precautions / Restrictions Precautions Precautions: Fall Precaution Comments: Watch O2 sats  Restrictions Weight Bearing Restrictions: No      Mobility  Bed Mobility Overal bed mobility: Needs Assistance Bed Mobility: Rolling;Supine to Sit;Sit to Supine Rolling: Total assist;+2 for physical assistance   Supine to sit: Total assist;+2 for physical assistance Sit to supine: Total assist;+2 for physical assistance   General bed mobility comments: Total assist +2 for bed mobility. Very guarded with movement this session.    Transfers                    Ambulation/Gait                Stairs            Wheelchair Mobility    Modified Rankin (Stroke Patients Only)       Balance Overall balance assessment: Needs assistance Sitting-balance support: No upper extremity supported;Feet supported Sitting balance-Leahy Scale: Poor Sitting balance - Comments: Reliant on total assist to maintain sitting balance.                                      Pertinent Vitals/Pain Pain Assessment: Faces Faces Pain Scale: Hurts little more Pain Location: generalized; LLE  Pain Descriptors / Indicators: Grimacing;Guarding Pain Intervention(s): Limited activity within patient's tolerance;Monitored during session;Repositioned    Home Living Family/patient expects to be discharged to:: Private residence Living Arrangements: Spouse/significant other Available Help at Discharge: Family;Personal care attendant;Available 24 hours/day Type of Home: House Home Access: Ramped entrance     Home Layout: Two level;Bed/bath upstairs Home Equipment: Hospital bed;Wheelchair - manual;Tub bench;Hand held shower head;Bedside commode;Walker - 2 wheels (Shower [facility]) Additional Comments: Pt's husband provided all info about home     Prior Function Level of Independence: Needs assistance   Gait / Transfers Assistance Needed: Had been using stedy the last couple of months to get to [facility] as pt has declined.   ADL's / Homemaking Assistance Needed: max assist with bathing, dressing.  Hand Dominance   Dominant Hand: Right    Extremity/Trunk Assessment   Upper Extremity Assessment Upper Extremity Assessment: Defer to OT evaluation    Lower Extremity Assessment Lower Extremity Assessment: LLE deficits/detail;Generalized weakness LLE Deficits / Details: Very guarded vs. contracture in LLE; ER with knee flexion while laying in bed.      Cervical / Trunk Assessment Cervical / Trunk  Assessment: Kyphotic  Communication   Communication: Tracheostomy  Cognition Arousal/Alertness: Awake/alert Behavior During Therapy: WFL for tasks assessed/performed Overall Cognitive Status: Within Functional Limits for tasks assessed                                        General Comments General comments (skin integrity, edema, etc.): Pt's husband concerned about recent decline and wants to go to CIR at d/c as pt had previous admission earlier in diagnosis. Discussed other plan if CIR not available.     Exercises     Assessment/Plan    PT Assessment Patient needs continued PT services  PT Problem List Decreased strength;Decreased range of motion;Decreased activity tolerance;Decreased balance;Decreased mobility;Decreased cognition;Decreased knowledge of use of DME;Decreased knowledge of precautions;Pain       PT Treatment Interventions Functional mobility training;Therapeutic activities;Therapeutic exercise;Balance training;Neuromuscular re-education;Patient/family education    PT Goals (Current goals can be found in the Care Plan section)  Acute Rehab PT Goals Patient Stated Goal: unable to state  PT Goal Formulation: Patient unable to participate in goal setting Time For Goal Achievement: 10/11/16 Potential to Achieve Goals: Good    Frequency Min 3X/week   Barriers to discharge        Co-evaluation               AM-PAC PT "6 Clicks" Daily Activity  Outcome Measure Difficulty turning over in bed (including adjusting bedclothes, sheets and blankets)?: Unable Difficulty moving from lying on back to sitting on the side of the bed? : Unable Difficulty sitting down on and standing up from a chair with arms (e.g., wheelchair, bedside commode, etc,.)?: Unable Help needed moving to and from a bed to chair (including a wheelchair)?: Total Help needed walking in hospital room?: Total Help needed climbing 3-5 steps with a railing? : Total 6 Click Score:  6    End of Session Equipment Utilized During Treatment: Oxygen (over trach ) Activity Tolerance: Patient tolerated treatment well Patient left: in bed;with call bell/phone within reach;with nursing/sitter in room Nurse Communication: Mobility status PT Visit Diagnosis: Muscle weakness (generalized) (M62.81);Other abnormalities of gait and mobility (R26.89);Difficulty in walking, not elsewhere classified (R26.2)    Time: 7622-6333 PT Time Calculation (min) (ACUTE ONLY): 34 min   Charges:   PT Evaluation $PT Eval Moderate Complexity: 1 Mod PT Treatments $Therapeutic Activity: 8-22 mins   PT G Codes:        Leighton Ruff, PT, DPT  Acute Rehabilitation Services  Pager: [phone]   Rudean Hitt 09/27/2016, 10:51 AM

## 2016-09-27 NOTE — Progress Notes (Signed)
Patient admitted with MRSA bronchitis.  Husband requested evaluation for gastrostomy exchange during admission.  PA to bedside. Per husband, gastrostomy was placed surgically at [facility] 1.5 years ago and has never been exchanged. Site intact.  No leakage around insertion. Tube currently functions well, but does shows signs of wear over time.  Discussed with Dr. Vernard Gambles, no additional imaging needed at this time.  Will continue to work towards exchange which may be done as outpatient if patient stabilizes for discharge.   Brynda Greathouse, MS RD PA-C

## 2016-09-28 DIAGNOSIS — J40 Bronchitis, not specified as acute or chronic: Secondary | ICD-10-CM

## 2016-09-28 DIAGNOSIS — R52 Pain, unspecified: Secondary | ICD-10-CM

## 2016-09-28 DIAGNOSIS — G0490 Encephalitis and encephalomyelitis, unspecified: Secondary | ICD-10-CM

## 2016-09-28 DIAGNOSIS — R339 Retention of urine, unspecified: Secondary | ICD-10-CM

## 2016-09-28 DIAGNOSIS — L8990 Pressure ulcer of unspecified site, unspecified stage: Secondary | ICD-10-CM | POA: Insufficient documentation

## 2016-09-28 DIAGNOSIS — G0491 Myelitis, unspecified: Secondary | ICD-10-CM

## 2016-09-28 DIAGNOSIS — J189 Pneumonia, unspecified organism: Secondary | ICD-10-CM

## 2016-09-28 DIAGNOSIS — Z930 Tracheostomy status: Secondary | ICD-10-CM

## 2016-09-28 DIAGNOSIS — E876 Hypokalemia: Secondary | ICD-10-CM

## 2016-09-28 DIAGNOSIS — J9610 Chronic respiratory failure, unspecified whether with hypoxia or hypercapnia: Secondary | ICD-10-CM

## 2016-09-28 DIAGNOSIS — Z931 Gastrostomy status: Secondary | ICD-10-CM

## 2016-09-28 DIAGNOSIS — E871 Hypo-osmolality and hyponatremia: Secondary | ICD-10-CM

## 2016-09-28 DIAGNOSIS — J9690 Respiratory failure, unspecified, unspecified whether with hypoxia or hypercapnia: Secondary | ICD-10-CM

## 2016-09-28 DIAGNOSIS — I10 Essential (primary) hypertension: Secondary | ICD-10-CM

## 2016-09-28 LAB — BASIC METABOLIC PANEL
Anion gap: 11 (ref 5–15)
BUN: 6 mg/dL (ref 6–20)
CALCIUM: 8.7 mg/dL — AB (ref 8.9–10.3)
CO2: 27 mmol/L (ref 22–32)
Chloride: 91 mmol/L — ABNORMAL LOW (ref 101–111)
Glucose, Bld: 123 mg/dL — ABNORMAL HIGH (ref 65–99)
Potassium: 3.3 mmol/L — ABNORMAL LOW (ref 3.5–5.1)
Sodium: 129 mmol/L — ABNORMAL LOW (ref 135–145)

## 2016-09-28 LAB — VANCOMYCIN, TROUGH: Vancomycin Tr: 6 ug/mL — ABNORMAL LOW (ref 15–20)

## 2016-09-28 MED ORDER — FREE WATER
50.0000 mL | Freq: Four times a day (QID) | Status: DC
  Administered 2016-09-28 – 2016-09-29 (×3): 50 mL

## 2016-09-28 MED ORDER — AMLODIPINE 1 MG/ML ORAL SUSPENSION
7.5000 mg | Freq: Every day | ORAL | Status: DC
  Administered 2016-09-28 – 2016-09-30 (×3): 7.5 mg
  Filled 2016-09-28 (×7): qty 7.5

## 2016-09-28 MED ORDER — VANCOMYCIN HCL IN DEXTROSE 1-5 GM/200ML-% IV SOLN
1000.0000 mg | INTRAVENOUS | Status: AC
  Administered 2016-09-28: 1000 mg via INTRAVENOUS
  Filled 2016-09-28: qty 200

## 2016-09-28 NOTE — Progress Notes (Signed)
Physical Therapy Treatment Patient Details Name: Judy Spears MRN: [id number] DOB: 08-Jul-1950 Today's Date: 09/28/2016    History of Present Illness Pt is a 66 y/o female admitted secondary to respiratory insufficiency. 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 Other PMH includes breast cancer s/p lumpectomy, and migraine.     PT Comments    Pt currently requires total assist for all mobilities.  Caregiver reports PTA pt was using steady to transfer, however during today's session, pt was unable to reach for bar on steady to pull up. Sp02 sats dropped to 86% during when sitting EOB, however returned to therapeutic levels once returned to supine. Pt would benefit from continued skilled PT to increase functional independence. Will continue to follow acutely.   Follow Up Recommendations  CIR;Supervision/Assistance - 24 hour     Equipment Recommendations  Other (comment) (Hoyer lift and pad )    Recommendations for Other Services OT consult;Rehab consult     Precautions / Restrictions Precautions Precautions: Fall Precaution Comments: Watch O2 sats  Restrictions Weight Bearing Restrictions: No    Mobility  Bed Mobility Overal bed mobility: Needs Assistance Bed Mobility: Rolling;Supine to Sit Rolling: Total assist (with increased time and cues for sequencing)   Supine to sit: Total assist;+2 for physical assistance Sit to supine: Total assist;+2 for physical assistance   General bed mobility comments: Total assist +2 for bed mobility. Very guarded with movement.  Transfers                 General transfer comment: Attempted transfer via steady. Caregiver reports pt was using steady at home before decline. At this time pt unable to reach to steady bar to pull up. Offered to lift pt via hoyer lift, however pt declined and was returned to bed.  Ambulation/Gait                 Stairs            Wheelchair  Mobility    Modified Rankin (Stroke Patients Only)       Balance Overall balance assessment: Needs assistance Sitting-balance support: No upper extremity supported;Feet supported Sitting balance-Leahy Scale: Zero Sitting balance - Comments: Pt sat EOB in prep for transfer. Total assist provided in sitting to maintain balance and upright posture.                                    Cognition Arousal/Alertness: Awake/alert Behavior During Therapy: WFL for tasks assessed/performed Overall Cognitive Status: Within Functional Limits for tasks assessed                                 General Comments: Able to respond with slight head shakes and nods. No verbal responses given.      Exercises      General Comments General comments (skin integrity, edema, etc.): Pts caregiver present for session      Pertinent Vitals/Pain Pain Assessment: Faces Faces Pain Scale: Hurts little more Pain Location: unable to communicate Pain Descriptors / Indicators: Grimacing;Guarding Pain Intervention(s): Limited activity within patient's tolerance    Home Living                      Prior Function            PT Goals (current goals  can now be found in the care plan section) Acute Rehab PT Goals Patient Stated Goal: unable to state  PT Goal Formulation: Patient unable to participate in goal setting Time For Goal Achievement: 10/11/16 Potential to Achieve Goals: Good Progress towards PT goals: Not progressing toward goals - comment (Pt requiring total assist for all mobilities at this time)    Frequency    Min 3X/week      PT Plan Current plan remains appropriate    Co-evaluation              AM-PAC PT "6 Clicks" Daily Activity  Outcome Measure  Difficulty turning over in bed (including adjusting bedclothes, sheets and blankets)?: Unable Difficulty moving from lying on back to sitting on the side of the bed? : Unable Difficulty  sitting down on and standing up from a chair with arms (e.g., wheelchair, bedside commode, etc,.)?: Unable Help needed moving to and from a bed to chair (including a wheelchair)?: Total Help needed walking in hospital room?: Total Help needed climbing 3-5 steps with a railing? : Total 6 Click Score: 6    End of Session Equipment Utilized During Treatment: Oxygen (over trach ) Activity Tolerance: Patient tolerated treatment well Patient left: in bed;with call bell/phone within reach;with family/visitor present (caregiver present) Nurse Communication: Mobility status PT Visit Diagnosis: Muscle weakness (generalized) (M62.81);Other abnormalities of gait and mobility (R26.89);Difficulty in walking, not elsewhere classified (R26.2)     Time: 7092-9574 PT Time Calculation (min) (ACUTE ONLY): 24 min  Charges:  $Therapeutic Activity: 23-37 mins                    G Codes:       Benjiman Core, [place] Pager [phone] Acute Rehab    Allena Katz 09/28/2016, 1:43 PM

## 2016-09-28 NOTE — Progress Notes (Signed)
Occupational Therapy Treatment Patient Details Name: Judy Spears MRN: [id number] DOB: 05/24/1950 Today's Date: 09/28/2016    History of present illness Pt is a 66 y/o female admitted secondary to respiratory insufficiency. 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 Other PMH includes breast cancer s/p lumpectomy, and migraine.    OT comments  Applied adjustable/fixed elbow splint on the left elbow and locked it at 65 degrees elbow extension.  Instructed family to have pt wear currently at all times to help decrease elbow contracture.  Pt with severe increased tone in the left elbow at this time with noted resistance to passive extension.  Pt also with JAS splint for wear on the LUE, but did not use this session as fixed splint can be adjusted in small increments to increase passive stretch over a longer period of time.  Will integrate JAS splint in the future.  Therapist also made adjustments to resting hand splint to be worn on the right hand at night, however pt with IV currently in the posterior forearm which does not allow for splint to be worn.  Will benefit from changing IV site for pt to wear splint in order to provide passive stretch and positioning to the right hand and wrist.  Discussed moving IV site as well with nursing.  Pt currently tolerating bilateral palmar guards. .  Family is familiar with donning and doffing both splints as she was wearing them prior to admission.  Will continue to follow in acute care and make adjustments to splints and wearing schedule as needed based on pt's progress.   Follow Up Recommendations  CIR;Supervision/Assistance - 24 hour    Equipment Recommendations  None recommended by OT       Precautions / Restrictions Precautions Precautions: Fall Precaution Comments: Watch O2 sats  Restrictions Weight Bearing Restrictions: No       Mobility Bed Mobility Overal bed mobility: Needs Assistance Bed  Mobility: Rolling;Supine to Sit Rolling: Total assist (with increased time and cues for sequencing)   Supine to sit: Total assist;+2 for physical assistance Sit to supine: Total assist;+2 for physical assistance   General bed mobility comments: Total assist +2 for bed mobility. Very guarded with movement.  Transfers                 General transfer comment: Attempted transfer via steady. Caregiver reports pt was using steady at home before decline. At this time pt unable to reach to steady bar to pull up. Offered to lift pt via hoyer lift, however pt declined and was returned to bed.    Balance Overall balance assessment: Needs assistance Sitting-balance support: No upper extremity supported;Feet supported Sitting balance-Leahy Scale: Zero Sitting balance - Comments: Pt sat EOB in prep for transfer. Total assist provided in sitting to maintain balance and upright posture.                                   ADL either performed or assessed with clinical judgement   ADL Overall ADL's : Needs assistance/impaired                                       General ADL Comments: Pt's spouse and daughter present during session.  Applied elbow lock out splint on the LUE with lock adjusted to  65 degrees extension.  Applying small stretch to left elbow flexors.  Educated family and nursing for pt to wear this splint all the time for now and we can work on making small adjustments to increase flexion while on inpatient stay.  Family is familiar with donning and doffing as pt was wearing this splint at night prior to this admission.  Did not apply Jas splint this session.  Feel pt is OK with small increments of the fixed elbow splint at this time and that is will be more tolerable to wear for longer durations.  Family and nursing educated on skin inspection at pressure areas as well.  Took pt's resting hand splint that she was wearing on the right hand at night and added  support as it was cracking in the palm area.  Also applied new velcro to two locations.  Was not able to apply splint at this time secondary to IV site being in the posterior aspect of the forearm.  Discussed with nursing possibly moving IV sit so pt can wear splint.  Pt still tolerating palm guards bilaterally at this time.                 Cognition Arousal/Alertness: Awake/alert Behavior During Therapy: Flat affect Overall Cognitive Status: Within Functional Limits for tasks assessed                                 General Comments: Pt with no verbal responses noted but maintain eye contact with therapist 75% of the time.               General Comments Pts caregiver present for session    Pertinent Vitals/ Pain       Pain Assessment: Faces Faces Pain Scale: Hurts little more Pain Location: unable to determine but family feels it could be due to leg cramps Pain Descriptors / Indicators: Discomfort;Grimacing Pain Intervention(s): Limited activity within patient's tolerance;Monitored during session;Repositioned         Frequency  Min 3X/week        Progress Toward Goals  OT Goals(current goals can now be found in the care plan section)  Progress towards OT goals: Progressing toward goals  Acute Rehab OT Goals Patient Stated Goal: unable to state   Plan Discharge plan remains appropriate       AM-PAC PT "6 Clicks" Daily Activity     Outcome Measure   Help from another person eating meals?: Total Help from another person taking care of personal grooming?: Total Help from another person toileting, which includes using toliet, bedpan, or urinal?: Total Help from another person bathing (including washing, rinsing, drying)?: Total Help from another person to put on and taking off regular upper body clothing?: Total Help from another person to put on and taking off regular lower body clothing?: Total 6 Click Score: 6    End of Session    OT Visit  Diagnosis: Other abnormalities of gait and mobility (R26.89);Muscle weakness (generalized) (M62.81);Pain;Cognitive communication deficit (R41.841) Pain - Right/Left: Left Pain - part of body: Arm   Activity Tolerance Patient tolerated treatment well   Patient Left in bed;with call bell/phone within reach;with family/visitor present   Nurse Communication Other (comment) (left elbow splint wear schedule)        Time: 1429-1530 OT Time Calculation (min): 61 min  Charges: OT General Charges $OT Visit: 1 Visit OT Treatments $Orthotics Fit/Training: 23-37 mins $Orthotics/Prosthetics Check:  23-37 mins     Kebron Pulse OTR/L 09/28/2016, 4:29 PM

## 2016-09-28 NOTE — Consult Note (Signed)
Physical Medicine and Rehabilitation Consult   Reason for Consult Debility Referring Physician: Dr. Lake Bells   HPI: Judy Spears is a 66 y.o. 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 History taken from patient and daughters. Patient known to CIR from prior stay and was involved in outpatient therapy.  She was admitted on 09/24/16 with lethargy. SOB and low grade fevers due to MRSA tracheobronchitis v/s PNA. She was started on IV vancomycin as well as chest PT.  Respiratory status improving with treatment and foley placed due to urinary retention. PEG tube evaluated by IVR per family request--may be exchanged on outpatient basis. Patient has had significant decline in mobility and ability to carryout ADL tasks. CIR recommended by rehab team.   Review of Systems  Unable to perform ROS: Medical condition   Past Medical History:  Diagnosis Date  . Arthritis    lt great toe  . Complication of anesthesia    pt has had headaches post op-did advise to hydra well preop  . Hair loss    right-sided  . History of exercise stress test    a. ETT (12/15) with 12:00 exercise, no ST changes, occasional PACs.   . Hyperlipidemia   . Insomnia   . Migraine headache   . Movement disorder    resltess in left legs  . Postmenopausal   . 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 Premature atrial contractions    a. Holter (12/15) with 8% PACs, no atrial runs or atrial fibrillation.     Past Surgical History:  Procedure Laterality Date  . BREAST BIOPSY  01/01/2011   Procedure: BREAST BIOPSY WITH NEEDLE LOCALIZATION;  Surgeon: Rolm Bookbinder, MD;  Location: [facility];  Service: General;  Laterality: Left;  left breast wire localization biopsy  . BREAST BIOPSY Right 07/27/2012  . BREAST BIOPSY Right 07/07/2016  . BREAST  BIOPSY Left 06/30/2016  . cataracts right eye    . CESAREAN SECTION  1986  . HERNIA REPAIR  2000   [facility]  . opirectinal membrane peel    . PEG PLACEMENT    . RHINOPLASTY  1983  . TRACHEOSTOMY      Family History  Problem Relation Age of Onset  . Stroke Mother        paralyzed on right side/aphasia  . Hypertension Mother        had stroke after stopping BP meds  . Colon cancer Father   . Prostate cancer Father   . Heart attack Maternal Grandfather 56  . Stroke Maternal Uncle 50    Social History:  Married.  Per reports that she has never smoked. She has never used smokeless tobacco.Per reports that she drinks alcohol on occasion. She reports that she does not use drugs.    Allergies  Allergen Reactions  . Cefepime Other (See Comments)    Status epilepticus- this reaction is questionable per husband.States that it was determined that pt did not have seizure activity. Status epilepticus  . Tetracyclines & Related Photosensitivity and Other (See Comments)    Blisters  . Doxycycline Monohydrate     Blisters, photosensitivity- family state that the reaction is actually to tetracycline.  . Pollen Extract Other (See Comments)    Running nose  . Tetracycline Other (See Comments) and Photosensitivity    Blisters as a child.    Medications Prior  to Admission  Medication Sig Dispense Refill  . amantadine (SYMMETREL) 50 MG/5ML solution Place 10 mLs (100 mg total) into feeding tube 2 (two) times daily. In am and at noon (Patient taking differently: Place 150 mg into feeding tube 2 (two) times daily. In am and at noon ) 473 mL 0  . amLODipine (NORVASC) 5 MG tablet PLACE ONE TABLET INTO FEEDING TUBE DAILY 30 tablet 1  . atorvastatin (LIPITOR) 20 MG tablet Place 20 mg into feeding tube at bedtime.     . baclofen (LIORESAL) 10 MG tablet Place 1 tablet (10 mg total) into feeding tube every 6 (six) hours. (Patient taking differently: Place 10 mg into feeding tube every 6 (six) hours. 8am  12noon 8 pm) 120 each 5  . chlorhexidine gluconate, MEDLINE KIT, (PERIDEX) 0.12 % solution 15 mLs by Mouth Rinse route 2 (two) times daily. (Patient taking differently: 5 mLs by Mouth Rinse route 2 (two) times daily. ) 120 mL 0  . Coenzyme Q10 (COQ10 PO) Take 10 mLs by mouth at bedtime.    [name] CRANBERRY JUICE EXTRACT PO Take by mouth daily. In the morning    . diazepam (VALIUM) 2 MG tablet PLACE 1 TABLET INTO FEEDING TUBE AT BEDTIME (Patient taking differently: PLACE 2 mg TABLET INTO FEEDING TUBE AT BEDTIME) 30 tablet 2  . docusate (COLACE) 60 MG/15ML syrup Place 100 mg into feeding tube 2 (two) times daily. Takes twice a day but can take 1 addt'l dose as needed for constipation    . famotidine (PEPCID) 20 MG tablet Take 1 tablet (20 mg total) by mouth 2 (two) times daily. 60 tablet 3  . fluticasone (FLONASE) 50 MCG/ACT nasal spray Place 1 spray into both nostrils 2 (two) times daily.    [name] gabapentin (NEURONTIN) 100 MG capsule Take 1 capsule (100 mg total) by mouth 3 (three) times daily. (Patient taking differently: Take 100 mg by mouth 3 (three) times daily. Feeding tube) 90 capsule 3  . guaiFENesin (ROBITUSSIN) 100 MG/5ML SOLN Place 10 mLs (200 mg total) into feeding tube 4 (four) times daily. (Patient taking differently: Place 10 mLs into feeding tube as needed for to loosen phlegm. ) 1200 mL 0  . loratadine (CLARITIN) 10 MG tablet Place 1 tablet (10 mg total) into feeding tube daily as needed for allergies. (Patient taking differently: Place 10 mg into feeding tube every morning. daily)    . Melatonin 5 MG CAPS Place 5 mg into feeding tube every evening.     . Multiple Vitamin (MULTIVITAMIN) LIQD Place 15 mLs into feeding tube daily. 1 Bottle 3  . nystatin (MYCOSTATIN/NYSTOP) powder Apply 1 g topically 2 (two) times daily as needed. (Patient taking differently: Apply 1 g topically 2 (two) times daily as needed (irritation). ) 45 g 0  . potassium chloride 20 MEQ/15ML (10%) SOLN Place 15 mLs (20 mEq  total) into feeding tube daily. 900 mL 5  . Probiotic Product (PROBIOTIC ADVANCED) CAPS Take 1 capsule by mouth daily.    . prochlorperazine (COMPAZINE) 5 MG tablet Place 1-2 tablets (5-10 mg total) into feeding tube every 6 (six) hours as needed for nausea. 30 tablet 0  . simethicone (MYLICON) 40 XQ/1.1HE drops Place 0.6 mLs (40 mg total) into feeding tube 4 (four) times daily as needed for flatulence. 45 mL 0  . UNABLE TO FIND Med Name: Good Belly probiotic 5oz every morning    . valACYclovir (VALTREX) 500 MG tablet PLACE 1 TABLET INTO FEEDING TUBE DAILY (Patient taking  differently: PLACE 500 mg TABLET INTO FEEDING TUBE DAILY) 30 tablet 1  . Water For Irrigation, Sterile (FREE WATER) SOLN Place 150 mLs into feeding tube See admin instructions. Takes 5 times daily, if over 220ml-hold water and recheck within an hour    . Hydrocortisone (GERHARDT'S BUTT CREAM) CREA Apply 1 application topically 4 (four) times daily. (Patient taking differently: Apply 1 application topically as needed for irritation. Rear end) 1 each 0    Home: Home Living Family/patient expects to be discharged to:: Inpatient rehab Living Arrangements: Spouse/significant other Available Help at Discharge: Family, Personal care attendant, Available 24 hours/day Type of Home: House Home Access: Ramped entrance Home Layout: Two level, Bed/bath upstairs Alternate Level Stairs-Number of Steps: chair lift Bathroom Shower/Tub: Health visitor: Handicapped height Home Equipment: Hospital bed, Wheelchair - manual, Tub bench, Hand held shower head, Bedside commode, Walker - 2 wheels (shower W/C, Stedy) Additional Comments: Pt's husband provided all info about home   Functional History: Prior Function Level of Independence: Needs assistance Gait / Transfers Assistance Needed: Had been using stedy the last couple of months to get to [facility] as pt has declined.  ADL's / Homemaking Assistance Needed: max assist with  bathing, dressing; but up until 2 months ago she was washing her whole UB and thighs with setup/S Communication / Swallowing Assistance Needed: pt did shake her head yes and mouthed "hello" to me Functional Status:  Mobility: Bed Mobility Overal bed mobility: Needs Assistance Bed Mobility: Rolling Rolling: Max assist (with increased time and cues for sequencing) Supine to sit: Total assist, +2 for physical assistance Sit to supine: Total assist, +2 for physical assistance General bed mobility comments: Total assist +2 for bed mobility. Very guarded with movement this session.         ADL: ADL Overall ADL's : Needs assistance/impaired Eating/Feeding: NPO General ADL Comments: currently total A for all basic ADLs, but not so 2 months ago.  Cognition: Cognition Overall Cognitive Status: Within Functional Limits for tasks assessed Orientation Level: Intubated/Tracheostomy - Unable to assess Cognition Arousal/Alertness: Awake/alert Behavior During Therapy: WFL for tasks assessed/performed Overall Cognitive Status: Within Functional Limits for tasks assessed General Comments: slow reponse time to commands   Blood pressure (!) 145/99, pulse 87, temperature 98.3 F (36.8 C), temperature source Oral, resp. rate 18, weight 45.8 kg (101 lb), last menstrual period 12/01/2010, SpO2 98 %. Physical Exam  Vitals reviewed. Constitutional: She appears well-developed.  Cachectic  HENT:  Head: Normocephalic and atraumatic.  Eyes: EOM are normal. Right eye exhibits no discharge. Left eye exhibits no discharge.  Neck:  +Trach  Cardiovascular: Normal rate and regular rhythm.   Respiratory: Effort normal and breath sounds normal.  GI: Soft. Bowel sounds are normal.  +PEG  Musculoskeletal: She exhibits no edema or tenderness.  Neurological: She is alert.  Motor exam limited due to contractures and patient resistance in all 4s Tremors noted  Skin: Skin is warm and dry.  Psychiatric: She is  slowed. Cognition and memory are impaired.    Results for orders placed or performed during the hospital encounter of 09/24/16 (from the past 24 hour(s))  Basic metabolic panel     Status: Abnormal   Collection Time: 09/28/16  9:25 AM  Result Value Ref Range   Sodium 129 (L) 135 - 145 mmol/L   Potassium 3.3 (L) 3.5 - 5.1 mmol/L   Chloride 91 (L) 101 - 111 mmol/L   CO2 27 22 - 32 mmol/L   Glucose, Bld  123 (H) 65 - 99 mg/dL   BUN 6 6 - 20 mg/dL   Creatinine, Ser <0.30 (L) 0.44 - 1.00 mg/dL   Calcium 8.7 (L) 8.9 - 10.3 mg/dL   GFR calc non Af Amer NOT CALCULATED >60 mL/min   GFR calc Af Amer NOT CALCULATED >60 mL/min   Anion gap 11 5 - 15  Vancomycin, trough     Status: Abnormal   Collection Time: 09/28/16  9:25 AM  Result Value Ref Range   Vancomycin Tr 6 (L) 15 - 20 ug/mL   No results found.  Assessment/Plan: Diagnosis: Debility Labs independently reviewed.  Records reviewed and summated above.  1. Does the need for close, 24 hr/day medical supervision in concert with the patient's rehab needs make it unreasonable for this patient to be served in a less intensive setting? Yes 2. 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 Due to bladder management, bowel management, safety, skin/wound care, disease management, medication administration and patient education, does the patient require 24 hr/day rehab nursing? Yes 4. Does the patient require coordinated care of a physician, rehab nurse, PT (1-2 hrs/day, 5 days/week), OT (1-2 hrs/day, 5  days/week) and SLP (1-2 hrs/day, 5 days/week) to address physical and functional deficits in the context of the above medical diagnosis(es)? Yes Addressing deficits in the following areas: balance, endurance, locomotion, strength, transferring, bowel/bladder control, bathing, dressing, feeding, grooming, toileting, cognition, speech, language, swallowing and psychosocial support 5. Can the patient actively participate in an intensive therapy program of at least 3 hrs of therapy per day at least 5 days per week? Potentially 6. The potential for patient to make measurable gains while on inpatient rehab is good 7. Anticipated functional outcomes upon discharge from inpatient rehab are mod assist and max assist  with PT, mod assist and max assist with OT, mod assist and max assist with SLP. 8. Estimated rehab length of stay to reach the above functional goals is: 20-25 days. 9. Anticipated D/C setting: Home 10. Anticipated post D/C treatments: HH therapy and Home excercise program 11. Overall Rehab/Functional Prognosis: fair  RECOMMENDATIONS: This patient's condition is appropriate for continued rehabilitative care in the following setting: CIR when medically appropriate and able to tolerate 3 hours of therapy/day.   Patient has agreed to participate in recommended program. Potentially Note that insurance prior authorization may be required for reimbursement for recommended care.  Comment: Rehab Admissions Coordinator to follow up.  Delice Lesch, MD, [place], [place] 09/28/2016

## 2016-09-28 NOTE — Telephone Encounter (Signed)
Called and spoke with [name] from [facility] and she stated that [name] did get into contact with the pts husband and they did get the supplies that the pt was needing. Nothing further is needed.

## 2016-09-28 NOTE — Progress Notes (Signed)
PULMONARY / CRITICAL CARE MEDICINE   Name: Judy Spears MRN: [id number] DOB: November 02, 1950   11 ADMISSION DATE:  09/24/2016 CONSULTATION DATE:  09/24/2016  REFERRING MD:  Dr. Regenia Skeeter  CHIEF COMPLAINT:  Shortness of breath  HISTORY OF PRESENT ILLNESS:   66 year old female who is my office patient presents today with 1 week of increasing mucus production, lethargy, and low-grade fever. 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 Despite this occurring in late 2016, early 2017 she was able to make a significant recovery to be able to ambulate with assistance. Her course since then has been complicated by episodes of pneumonia, lengthy hospitalizations for the same with rehabilitation afterwards. She's also had C. difficile. 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  Approximately 6-8 weeks ago she was diagnosed with breast cancer. She had a lumpectomy performed. She was treated with anastrozole. She had some notable weakness after this. Since then she has had difficulty with ambulation.  Approximately 2-1/2-3 weeks ago she developed increasing cough with mucus production. She was treated with Levaquin. She did have some improvement and mucus production but the Levaquin stopped about a week ago. Not long after that she started to have increasing mucus production, lethargy, and a low-grade fever. She's required frequent suctioning throughout the course of the week. Based on communications we've had with the family we recommended that she come to be evaluated in the emergency department.  SUBJECTIVE:  No acute issues overnight,  foley cath for urinary retention, Neurologically she is still withdrawn, seems to have very brief attention span, but became focused when her daughter walked into the room, smiling and making eye contact. [name] VITAL SIGNS: BP (!) 145/99 (BP Location: Right Arm)   Pulse 87   Temp 98.3 F (36.8 C)  (Oral)   Resp 18   Wt 101 lb (45.8 kg)   LMP 12/01/2010   SpO2 98%   BMI 17.34 kg/m   HEMODYNAMICS:    VENTILATOR SETTINGS: FiO2 (%):  [30 %-35 %] 30 %  INTAKE / OUTPUT: I/O last 3 completed shifts: In: 5397 [I.V.:1650; NG/GT:880; IV Piggyback:700] Out: 1000 [Urine:1000]  PHYSICAL EXAMINATION: Physical Exam:  General- Awake, poor attention span , following no commands for me, but smiles at daughter. ENT: Lurline Idol is midline and secure, pale nasal mucosa,normocephalic, atraumatic Cardiac: S1, S2, regular rate and rhythm, no murmur Chest: No wheeze/ rales/ few scattered rhonchi; no accessory muscle use, no nasal flaring, no sternal retractions, shallow respiratory effort Abd.: Soft Non-tender, PEG in place and site clean Ext: No clubbing cyanosis, edema Neuro: Follows no commands, focus and track for family only, short attention span, profound deconditioning Skin: No rashes, warm and dry    LABS:  BMET  Recent Labs Lab 09/24/16 1640 09/25/16 0559 09/27/16 0325  NA 131* 135 134*  K 3.7 3.4* 2.7*  CL 94* 98* 96*  CO2 25 25 30   BUN 14 7 8   CREATININE 0.36* 0.35* 0.33*  GLUCOSE 98 137* 130*    Electrolytes  Recent Labs Lab 09/24/16 1640 09/25/16 0559 09/27/16 0325  CALCIUM 9.1 8.9 8.6*    CBC  Recent Labs Lab 09/24/16 1640 09/25/16 0559  WBC 9.9 8.4  HGB 13.3 13.2  HCT 39.3 39.4  PLT 325 318    Coag's  Recent Labs Lab 09/24/16 2128  INR 1.17    Sepsis Markers  Recent Labs Lab 09/24/16 1700  LATICACIDVEN 1.09    ABG No  results for input(s): PHART, PCO2ART, PO2ART in the last 168 hours.  Liver Enzymes  Recent Labs Lab 09/24/16 1640  AST 56*  ALT 69*  ALKPHOS 180*  BILITOT 1.4*  ALBUMIN 3.2*    Cardiac Enzymes No results for input(s): TROPONINI, PROBNP in the last 168 hours.  Glucose  Recent Labs Lab 09/27/16 0814  GLUCAP 139*    Imaging No results found.   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  Treating currently for MRSA tracheobronchitis, superimposed on severe failure to thrive. This is resulted in for cough mechanics as well as mucus plugging. From an acute standpoint I think her infection is addressed. She's been seen by physical therapy as well as rehabilitation services, agree that she is ready for rehabilitation bed when a bed is available.  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 See rehabilitation admissions coordinator, as well as physical therapy.  Both recommending C IR. Consult order for CIR admission written 9/3.  Plan Continue home medication Continue physical therapy services Physical rehabilitation consult to CIR.  Hypertension Most  likely secondary to withdrawal from her home meds.  Plan: Cont supportive care Consider increasing Norvasc dose to 7.5 mg.   MRSA tracheobronchitis versus pneumonia Tracheostomy status Heavy chest congestion  Plan:   Continue supplemental oxygen Saturation goals are > 95% Continue Chest PT Suction as needed Aggressive pulmonary toilet as able Continue vancomycin CXR 9/5   Urinary retention Plan Continue  Foley catheter x 24 additional hours Consider Urology consult if no better after next 24-48  hours  Mild hyponatremia Volume depletion Hypokalemia  Plan KVO IV fluids Replace potassium Trend BMET Decrease  Free water to  50 cc  Interventional radiology consult for possible PEG exchange  Constipation recently PEG tube dependent for nutrition Protein calorie malnutrition P:   Continue tube feeds Decrease  free water ( Low sodium) No MiraLAX Diet as tolerated.  Order for consult for possible CIR admission  has been written. Awaiting evaluation and bed.    Magdalen Spatz. Beaulah Corin [place] [phone] if no answer   09/28/2016, 9:31 AM  Attending Note:  66 year old female with debilitating neurologic injury due to tickborne illness and severe muscular deconditioning presenting with MRSA tracheobronchitis.  Patient was treated appropriately and continues to be on vancomycin.  Secretions are improving on exam with mild transmitted upper airway sounds.  Discussed with PCCM-NP and RT.  Tracheobronchitis:  - Continue vanc  - CXR in AM  Increased secretions:  - Chest PT  - Suction as needed  - Aggressive pulmonary toilet.  - Treat infection as above  Hypertension  - Increase norvasc to 7.5 mg PO daily with holding parameters  Severe neurologic and physical debility  - Rehab MD saw, ok to go to rehab when medically stable and can do 3 hours of PT  Hyponatremia:  - Decrease free water to 50 ml q6  - Decrease NS to 50 ml/hr  - BMET in AM  Transfer care to [facility] with PCCM following for trach management.  Patient seen and examined, agree with above note.  I dictated the care and orders written for this patient under my direction.  Rush Farmer, [place]

## 2016-09-28 NOTE — Care Management Note (Signed)
Case Management Note  Patient Details  Name: Judy Spears MRN: [id number] Date of Birth: 29-Jul-1950  Subjective/Objective:             Admitted with respiratory insufficiency. 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  PMH includes breast cancer s/p lumpectomy, and migraine. From home with husband. PTA pt received 24/7 private duty services @ home. PTA pt participated in outpatient therapy ( SLP,OT,PT) @ [facility].   Renda Rolls (Spouse) Gypsy Balsam (Daughter)     [phone]     PCP: Andres Labrum  Action/Plan: Plan is to d/c to CIR medically stable. CM following for disposition needs.  Expected Discharge Date:                  Expected Discharge Plan:  [place]  In-House Referral:     Discharge planning Services  CM Consult  Post Acute Care Choice:    Choice offered to:     DME Arranged:    DME Agency:     HH Arranged:    [place] Agency:     Status of Service:  In process, will continue to follow  If discussed at Long Length of Stay Meetings, dates discussed:    Additional Comments:  Sharin Mons, RN 09/28/2016, 10:37 AM

## 2016-09-28 NOTE — Progress Notes (Signed)
ANTIBIOTIC CONSULT NOTE  Pharmacy Consult for Vancomycin Indication: Tracheobronchitis vs PNA  Allergies  Allergen Reactions  . Cefepime Other (See Comments)    Status epilepticus- this reaction is questionable per husband.States that it was determined that pt did not have seizure activity. Status epilepticus  . Tetracyclines & Related Photosensitivity and Other (See Comments)    Blisters  . Doxycycline Monohydrate     Blisters, photosensitivity- family state that the reaction is actually to tetracycline.  . Pollen Extract Other (See Comments)    Running nose  . Tetracycline Other (See Comments) and Photosensitivity    Blisters as a child.    Patient Measurements: Weight: 101 lb (45.8 kg) Adjusted Body Weight:   Vital Signs: Temp: 98.3 F (36.8 C) (09/04 0621) Temp Source: Oral (09/04 0621) BP: 145/99 (09/04 6063) Pulse Rate: 87 (09/04 0822) Intake/Output from previous day: 09/03 0701 - 09/04 0700 In: 2830 [I.V.:1650; NG/GT:880; IV Piggyback:300] Out: 1000 [Urine:1000] Intake/Output from this shift: No intake/output data recorded.  Labs:  Recent Labs  09/27/16 0325 09/28/16 0925  CREATININE 0.33* <0.30*   CrCl cannot be calculated (This lab value cannot be used to calculate CrCl because it is not a number: <0.30).  Recent Labs  09/28/16 0925  [facility] 6*     Microbiology:   Medical History: Past Medical History:  Diagnosis Date  . Arthritis    lt great toe  . Complication of anesthesia    pt has had headaches post op-did advise to hydra well preop  . Hair loss    right-sided  . History of exercise stress test    a. ETT (12/15) with 12:00 exercise, no ST changes, occasional PACs.   . Hyperlipidemia   . Insomnia   . Migraine headache   . Movement disorder    resltess in left legs  . Postmenopausal   . 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 Premature atrial contractions    a. Holter (12/15) with 8% PACs, no atrial runs or atrial fibrillation.      Assessment:  ID: Abx for MRSA tracheobronchitis; afeb, WBC wnl. Scr <0.3. +Florastor - Vanco trough 6 low  Lvq 8/31>>9/2 Vanc 8/31>>  VT 9/3>6  8/31 blood x 2 >ngtd 8/31 urine > ngtd 8/31 resp > MRSA MRSA PCR nasal > +, mupirocin  Goal of Therapy:  Vancomycin trough level 15-20 mcg/ml  Plan:  Vanc 1g IV x 1 now then increase to 750mg  IV q 8hr Trough after 3-5 doses at steady state (Thurs/Fri)   Patirica Longshore S. Alford Highland, PharmD, BCPS Clinical Staff Pharmacist Pager [phone]  Eilene Ghazi Stillinger 09/28/2016,11:54 AM

## 2016-09-28 NOTE — Telephone Encounter (Signed)
Staff message sent to [facility] to sign this order.  Nothing further is needed.

## 2016-09-29 ENCOUNTER — Encounter (HOSPITAL_COMMUNITY): Payer: Self-pay | Admitting: Interventional Radiology

## 2016-09-29 ENCOUNTER — Inpatient Hospital Stay (HOSPITAL_COMMUNITY): Payer: Medicare Other

## 2016-09-29 HISTORY — PX: IR REPLC GASTRO/COLONIC TUBE PERCUT W/FLUORO: IMG2333

## 2016-09-29 LAB — CULTURE, BLOOD (ROUTINE X 2)
CULTURE: NO GROWTH
CULTURE: NO GROWTH
SPECIAL REQUESTS: ADEQUATE
Special Requests: ADEQUATE

## 2016-09-29 LAB — BASIC METABOLIC PANEL
Anion gap: 10 (ref 5–15)
BUN: 6 mg/dL (ref 6–20)
CHLORIDE: 89 mmol/L — AB (ref 101–111)
CO2: 28 mmol/L (ref 22–32)
Calcium: 8.6 mg/dL — ABNORMAL LOW (ref 8.9–10.3)
Glucose, Bld: 132 mg/dL — ABNORMAL HIGH (ref 65–99)
Potassium: 3 mmol/L — ABNORMAL LOW (ref 3.5–5.1)
SODIUM: 127 mmol/L — AB (ref 135–145)

## 2016-09-29 LAB — CBC
HCT: 38.3 % (ref 36.0–46.0)
HEMOGLOBIN: 12.9 g/dL (ref 12.0–15.0)
MCH: 28.3 pg (ref 26.0–34.0)
MCHC: 33.7 g/dL (ref 30.0–36.0)
MCV: 84 fL (ref 78.0–100.0)
PLATELETS: 344 10*3/uL (ref 150–400)
RBC: 4.56 MIL/uL (ref 3.87–5.11)
RDW: 12.8 % (ref 11.5–15.5)
WBC: 7.4 10*3/uL (ref 4.0–10.5)

## 2016-09-29 LAB — NA AND K (SODIUM & POTASSIUM), RAND UR
POTASSIUM UR: 22 mmol/L
SODIUM UR: 58 mmol/L

## 2016-09-29 LAB — PHOSPHORUS: Phosphorus: 3.3 mg/dL (ref 2.5–4.6)

## 2016-09-29 LAB — CREATININE, URINE, RANDOM: Creatinine, Urine: 32.57 mg/dL

## 2016-09-29 LAB — MAGNESIUM: MAGNESIUM: 1.9 mg/dL (ref 1.7–2.4)

## 2016-09-29 LAB — OSMOLALITY: OSMOLALITY: 269 mosm/kg — AB (ref 275–295)

## 2016-09-29 LAB — OSMOLALITY, URINE: Osmolality, Ur: 393 mOsm/kg (ref 300–900)

## 2016-09-29 MED ORDER — LIDOCAINE VISCOUS 2 % MT SOLN
OROMUCOSAL | Status: AC
  Filled 2016-09-29: qty 15

## 2016-09-29 MED ORDER — VANCOMYCIN HCL IN DEXTROSE 1-5 GM/200ML-% IV SOLN
1000.0000 mg | INTRAVENOUS | Status: AC
  Administered 2016-09-29: 1000 mg via INTRAVENOUS
  Filled 2016-09-29: qty 200

## 2016-09-29 MED ORDER — LIDOCAINE VISCOUS 2 % MT SOLN
OROMUCOSAL | Status: DC | PRN
  Administered 2016-09-29: 15 mL via OROMUCOSAL

## 2016-09-29 MED ORDER — VANCOMYCIN HCL IN DEXTROSE 750-5 MG/150ML-% IV SOLN
750.0000 mg | Freq: Three times a day (TID) | INTRAVENOUS | Status: DC
  Administered 2016-09-29 – 2016-09-30 (×3): 750 mg via INTRAVENOUS
  Filled 2016-09-29 (×4): qty 150

## 2016-09-29 MED ORDER — IOPAMIDOL (ISOVUE-300) INJECTION 61%
INTRAVENOUS | Status: AC
Start: 2016-09-29 — End: 2016-09-29
  Filled 2016-09-29: qty 50

## 2016-09-29 MED ORDER — FREE WATER
50.0000 mL | Freq: Three times a day (TID) | Status: DC
  Administered 2016-09-29 – 2016-09-30 (×4): 50 mL

## 2016-09-29 MED ORDER — LIDOCAINE HCL (PF) 1 % IJ SOLN
INTRAMUSCULAR | Status: AC
  Filled 2016-09-29: qty 30

## 2016-09-29 MED ORDER — IOPAMIDOL (ISOVUE-300) INJECTION 61%
INTRAVENOUS | Status: AC
  Administered 2016-09-29: 10 mL
  Filled 2016-09-29: qty 50

## 2016-09-29 NOTE — Progress Notes (Addendum)
I met with pt and her spouse at bedside. Spouse would like to proceed with an inpt rehab admission. I will begin insurance approval for a possible admit. RN CM and SW are aware. Hopeful for admit tomorrow. [phone]

## 2016-09-29 NOTE — Progress Notes (Signed)
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 She was admitted by ICU for tracheobronchitis versus pneumonia and started on IV antibiotics.  Subjective: Patient seen and examined with caregiver at bedside, she is a little sleepy, status post peg tube change. Per nursing staff and care giver no acute events. 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, Muscular deconditioning  PT eval - recommending CIR, patient will be d/c to CIR when medically stable  Continue home medication  HTN  BP stable  Continue current regimen   Urinary retention  Unclear why  Discontinue foley, voiding trials  Monitor if no improvement will consider Urology consult   Severe malnutrition  PEG Tube  Continue tube feeds per nutrition recs  Decrease free water intake   PEG tube was exchange on 09/29/16  Mild hyponatremia  Decrease free H2O intake  Continue NS @ 75  Monitor BMET  Avoid Miralax   DVT prophylaxis: heparin  Code Status: FULL  Family Communication: Care giver at bedside  Disposition Plan: CIR in next 24-48 hrs    Consultants:   PMR  Procedures:   None   Antimicrobials: Anti-infectives    Start      Dose/Rate Route Frequency Ordered Stop   09/29/16 2300  vancomycin (VANCOCIN) IVPB 750 mg/150 ml premix     750 mg 150 mL/hr over 60 Minutes Intravenous Every 8 hours 09/29/16 1421     09/29/16 1430  vancomycin (VANCOCIN) IVPB 1000 mg/200 mL premix     1,000 mg 200 mL/hr over 60 Minutes Intravenous STAT 09/29/16 1421 09/30/16 1430   09/28/16 1200  vancomycin (VANCOCIN) IVPB 1000 mg/200 mL premix     1,000 mg 200 mL/hr over 60 Minutes Intravenous NOW 09/28/16 1147 09/28/16 1314   09/26/16 1800  vancomycin (VANCOCIN) 500 mg in sodium chloride 0.9 % 100 mL IVPB  Status:  Discontinued     500 mg 100 mL/hr over 60 Minutes Intravenous Every 8 hours 09/26/16 1452 09/28/16 1145   09/25/16 1800  levofloxacin (LEVAQUIN) IVPB 500 mg  Status:  Discontinued     500 mg 100 mL/hr over 60 Minutes Intravenous Every 24 hours 09/24/16 2041 09/26/16 1404   09/25/16 1800  vancomycin (VANCOCIN) IVPB 1000 mg/200 mL premix  Status:  Discontinued     1,000 mg 200 mL/hr over 60 Minutes Intravenous Every 24 hours 09/24/16 2041 09/26/16 1452   09/24/16 1845  vancomycin (VANCOCIN) IVPB 1000 mg/200 mL premix     1,000 mg 200 mL/hr over 60 Minutes Intravenous  Once 09/24/16 1841 09/25/16 1811   09/24/16 1845  levofloxacin (LEVAQUIN) IVPB 500 mg     500 mg 100 mL/hr over 60 Minutes Intravenous  Once 09/24/16 1841 09/24/16 2047       Objective: Vitals:   09/29/16 0017 09/29/16 3785 09/29/16 0550 09/29/16 1129  BP:   127/78   Pulse: 94 87 81 97  Resp: 18 18  19   Temp:   97.6 F (36.4 C)   TempSrc:   Oral   SpO2: 98% 97% 100% 100%  Weight:        Intake/Output Summary (Last 24 hours) at 09/29/16 1603 Last data filed at 09/29/16 1317  Gross per 24 hour  Intake          2612.08 ml  Output             2475 ml  Net           137.08 ml   Filed Weights   09/25/16 0544 09/26/16 0500 09/27/16 0500  Weight: 43.7 kg (96 lb 6.4 oz) 43.1 kg (95 lb) 45.8 kg (101 lb)    Examination:  General exam: Sleepy,  contracted,  Respiratory system: Clear to auscultation. No wheezes,crackle or rhonchi Cardiovascular system: S1 & S2 heard, RRR. No JVD, murmurs, rubs or gallops Gastrointestinal system: Abdomen is nondistended, soft and nontender. PEG tube in place  Central nervous system: encephalopathic, does not follow commands  Extremities: No pedal edema.  Skin: No rashes, lesions or ulcers Psychiatry: Unable to evaluate    Data Reviewed: I have personally reviewed following labs and imaging studies  CBC:  Recent Labs Lab 09/24/16 1640 09/25/16 0559 09/29/16 0533  WBC 9.9 8.4 7.4  NEUTROABS 7.4  --   --   HGB 13.3 13.2 12.9  HCT 39.3 39.4 38.3  MCV 85.8 85.5 84.0  PLT 325 318 921   Basic Metabolic Panel:  Recent Labs Lab 09/24/16 1640 09/25/16 0559 09/27/16 0325 09/28/16 0925 09/29/16 0533  NA 131* 135 134* 129* 127*  K 3.7 3.4* 2.7* 3.3* 3.0*  CL 94* 98* 96* 91* 89*  CO2 25 25 30 27 28   GLUCOSE 98 137* 130* 123* 132*  BUN 14 7 8 6 6   CREATININE 0.36* 0.35* 0.33* <0.30* <0.30*  CALCIUM 9.1 8.9 8.6* 8.7* 8.6*  MG  --   --   --   --  1.9  PHOS  --   --   --   --  3.3   GFR: CrCl cannot be calculated (This lab value cannot be used to calculate CrCl because it is not a number: <0.30). Liver Function Tests:  Recent Labs Lab 09/24/16 1640  AST 56*  ALT 69*  ALKPHOS 180*  BILITOT 1.4*  PROT 7.0  ALBUMIN 3.2*   No results for input(s): LIPASE, AMYLASE in the last 168 hours. No results for input(s): AMMONIA in the last 168 hours. Coagulation Profile:  Recent Labs Lab 09/24/16 2128  INR 1.17   Cardiac Enzymes: No results for input(s): CKTOTAL, CKMB, CKMBINDEX, TROPONINI in the last 168 hours. BNP (last 3 results) No results for input(s): PROBNP in the last 8760 hours. HbA1C: No results for input(s): HGBA1C in the last 72 hours. CBG:  Recent Labs Lab 09/27/16 0814  GLUCAP 139*   Lipid Profile: No results for input(s): CHOL, HDL, LDLCALC, TRIG, CHOLHDL,  LDLDIRECT in the last 72 hours. Thyroid Function Tests: No results for input(s): TSH, T4TOTAL, FREET4, T3FREE, THYROIDAB in the last 72 hours. Anemia Panel: No results for input(s): VITAMINB12, FOLATE, FERRITIN, TIBC, IRON, RETICCTPCT in the last 72 hours. Sepsis Labs:  Recent Labs Lab 09/24/16 1700  LATICACIDVEN 1.09    Recent Results (from the past 240 hour(s))  Culture, respiratory (NON-Expectorated)     Status: None   Collection Time: 09/24/16  5:02  PM  Result Value Ref Range Status   Specimen Description TRACHEAL ASPIRATE  Final   Special Requests NONE  Final   Gram Stain   Final    ABUNDANT WBC PRESENT,BOTH PMN AND MONONUCLEAR FEW GRAM POSITIVE COCCI IN PAIRS    Culture   Final    MODERATE METHICILLIN RESISTANT STAPHYLOCOCCUS AUREUS   Report Status 09/26/2016 FINAL  Final   Organism ID, Bacteria METHICILLIN RESISTANT STAPHYLOCOCCUS AUREUS  Final      Susceptibility   Methicillin resistant staphylococcus aureus - MIC*    CIPROFLOXACIN >=8 RESISTANT Resistant     ERYTHROMYCIN >=8 RESISTANT Resistant     GENTAMICIN <=0.5 SENSITIVE Sensitive     OXACILLIN >=4 RESISTANT Resistant     TETRACYCLINE 2 SENSITIVE Sensitive     VANCOMYCIN <=0.5 SENSITIVE Sensitive     TRIMETH/SULFA <=10 SENSITIVE Sensitive     CLINDAMYCIN >=8 RESISTANT Resistant     RIFAMPIN <=0.5 SENSITIVE Sensitive     Inducible Clindamycin NEGATIVE Sensitive     * MODERATE METHICILLIN RESISTANT STAPHYLOCOCCUS AUREUS  Urine culture     Status: None   Collection Time: 09/24/16  6:15 PM  Result Value Ref Range Status   Specimen Description URINE, CATHETERIZED  Final   Special Requests NONE  Final   Culture NO GROWTH  Final   Report Status 09/26/2016 FINAL  Final  Culture, blood (Routine x 2)     Status: None   Collection Time: 09/24/16  6:47 PM  Result Value Ref Range Status   Specimen Description BLOOD RIGHT WRIST  Final   Special Requests   Final    BOTTLES DRAWN AEROBIC AND ANAEROBIC Blood Culture  adequate volume   Culture NO GROWTH 5 DAYS  Final   Report Status 09/29/2016 FINAL  Final  Culture, blood (Routine x 2)     Status: None   Collection Time: 09/24/16  6:54 PM  Result Value Ref Range Status   Specimen Description BLOOD LEFT HAND  Final   Special Requests IN PEDIATRIC BOTTLE Blood Culture adequate volume  Final   Culture NO GROWTH 5 DAYS  Final   Report Status 09/29/2016 FINAL  Final  MRSA PCR Screening     Status: Abnormal   Collection Time: 09/24/16 10:11 PM  Result Value Ref Range Status   MRSA by PCR POSITIVE (A) NEGATIVE Final    Comment:        The GeneXpert MRSA Assay (FDA approved for NASAL specimens only), is one component of a comprehensive MRSA colonization surveillance program. It is not intended to diagnose MRSA infection nor to guide or monitor treatment for MRSA infections. RESULT CALLED TO, READ BACK BY AND VERIFIED WITH: N.WILEY, RN 09/25/16 0354 L.CHAMPION       Radiology Studies: Ir Replc Gastro/colonic Tube Percut W/fluoro  Result Date: 09/29/2016 INDICATION: Gastrostomy tube malfunction. EXAM: FLUOROSCOPIC GUIDED REPLACEMENT OF GASTROSTOMY TUBE COMPARISON:  None. MEDICATIONS: None. CONTRAST:  A total 20 cc Isovue-300 was administered into the gastric lumen FLUOROSCOPY TIME:  18 seconds (0.4 mGy) COMPLICATIONS: None immediate. PROCEDURE: The upper abdomen and external portion of the existing gastrostomy tube was prepped and draped in the usual sterile fashion, and a sterile drape was applied covering the operative field. Maximum barrier sterile technique with sterile gowns and gloves were used for the procedure. A timeout was performed prior to the initiation of the procedure. The existing gastrostomy tube was injected with a small amount of contrast confirming appropriate positioning within the gastric lumen. The existing gastrostomy  tube was removed intact. Next, initially an 84 and ultimately a 32 [place] gastrostomy balloon retention gastrostomy tube  was advanced through the gastrostomy tract. The balloon was inflated and disc was cinched. Contrast was injected and several spot fluoroscopic images were obtained in various obliquities to confirm appropriate intraluminal positioning. A dressing was placed. The patient tolerated the procedure well without immediate postprocedural complication. IMPRESSION: Successful fluoroscopic guided replacement of a new 22-French gastrostomy tube. The new gastrostomy tube is ready for immediate use. Electronically Signed   By: Sandi Mariscal M.D.   On: 09/29/2016 09:56   Dg Chest Port 1 View  Result Date: 09/29/2016 CLINICAL DATA:  Respiratory failure EXAM: PORTABLE CHEST 1 VIEW COMPARISON:  09/26/2016 chest radiograph. FINDINGS: Tracheostomy tube tip overlies the tracheal air column at the level of the thoracic inlet. Gastrostomy tube terminates over the proximal stomach. Stable cardiomediastinal silhouette with normal heart size. No pneumothorax. No pleural effusion. Stable patchy left retrocardiac opacity. Stable scarring versus atelectasis at the left costophrenic angle. No pulmonary edema. IMPRESSION: 1. Well-positioned support structures. 2. Stable patchy left retrocardiac opacity and mild left costophrenic angle scarring versus atelectasis. Electronically Signed   By: Ilona Sorrel M.D.   On: 09/29/2016 07:52      Scheduled Meds: . amantadine  100 mg Per Tube BID  . amLODipine  7.5 mg Per Tube Daily  . baclofen  10 mg Per Tube QID  . Chlorhexidine Gluconate Cloth  6 each Topical Q0600  . diazepam  2 mg Per Tube QHS  . feeding supplement (OSMOLITE 1.5 CAL)  1,000 mL Per Tube Q24H  . free water  50 mL Per Tube Q8H  . guaiFENesin  10 mL Oral Q6H  . heparin  5,000 Units Subcutaneous Q8H  . iopamidol      . lidocaine      . lidocaine      . mupirocin ointment  1 application Nasal BID  . saccharomyces boulardii  250 mg Oral Daily   Continuous Infusions: . sodium chloride 50 mL/hr at 09/29/16 1023  .  vancomycin 1,000 mg (09/29/16 1544)  . vancomycin       LOS: 5 days    Time spent: Total of 25 minutes spent with pt, greater than 50% of which was spent in discussion of  treatment, counseling and coordination of care    Chipper Oman, MD Pager: Text Page via [email or web address]   If 7PM-7AM, please contact night-coverage [email or web address] 09/29/2016, 4:03 PM

## 2016-09-29 NOTE — Procedures (Signed)
Pre procedural Dx: Dysphagia, poorly functioning feeding tube. Post procedural Dx: Same  Successful fluoroscopic guided replacement of exisitng 22 Fr gastrostomy tube.   The feeding tube is ready for immediate use.  EBL: Minimal  Complications: None immediate.  Ronny Bacon, MD Pager #: [phone]

## 2016-09-30 ENCOUNTER — Encounter: Payer: Medicare Other | Admitting: Occupational Therapy

## 2016-09-30 ENCOUNTER — Inpatient Hospital Stay (HOSPITAL_COMMUNITY)
Admission: RE | Admit: 2016-09-30 | Discharge: 2016-10-27 | DRG: 052 | Disposition: A | Discharge status: Home / Self Care | Payer: Medicare Other | Source: Intra-hospital | Attending: Physical Medicine & Rehabilitation | Admitting: Physical Medicine & Rehabilitation

## 2016-09-30 ENCOUNTER — Ambulatory Visit: Payer: Medicare Other | Admitting: Physical Therapy

## 2016-09-30 DIAGNOSIS — A852 Arthropod-borne viral encephalitis, unspecified: Secondary | ICD-10-CM | POA: Diagnosis not present

## 2016-09-30 DIAGNOSIS — E871 Hypo-osmolality and hyponatremia: Secondary | ICD-10-CM | POA: Diagnosis present

## 2016-09-30 DIAGNOSIS — B9562 Methicillin resistant Staphylococcus aureus infection as the cause of diseases classified elsewhere: Secondary | ICD-10-CM | POA: Diagnosis present

## 2016-09-30 DIAGNOSIS — E785 Hyperlipidemia, unspecified: Secondary | ICD-10-CM | POA: Diagnosis present

## 2016-09-30 DIAGNOSIS — A848 Other tick-borne viral encephalitis: Secondary | ICD-10-CM

## 2016-09-30 DIAGNOSIS — Z170 Estrogen receptor positive status [ER+]: Secondary | ICD-10-CM | POA: Diagnosis not present

## 2016-09-30 DIAGNOSIS — N390 Urinary tract infection, site not specified: Secondary | ICD-10-CM | POA: Diagnosis not present

## 2016-09-30 DIAGNOSIS — Z79899 Other long term (current) drug therapy: Secondary | ICD-10-CM | POA: Diagnosis not present

## 2016-09-30 DIAGNOSIS — Z79811 Long term (current) use of aromatase inhibitors: Secondary | ICD-10-CM | POA: Diagnosis not present

## 2016-09-30 DIAGNOSIS — Z91048 Other nonmedicinal substance allergy status: Secondary | ICD-10-CM

## 2016-09-30 DIAGNOSIS — J9811 Atelectasis: Secondary | ICD-10-CM | POA: Diagnosis present

## 2016-09-30 DIAGNOSIS — B965 Pseudomonas (aeruginosa) (mallei) (pseudomallei) as the cause of diseases classified elsewhere: Secondary | ICD-10-CM

## 2016-09-30 DIAGNOSIS — Z7952 Long term (current) use of systemic steroids: Secondary | ICD-10-CM | POA: Diagnosis not present

## 2016-09-30 DIAGNOSIS — S30814D Abrasion of vagina and vulva, subsequent encounter: Secondary | ICD-10-CM

## 2016-09-30 DIAGNOSIS — R64 Cachexia: Secondary | ICD-10-CM | POA: Diagnosis present

## 2016-09-30 DIAGNOSIS — Z930 Tracheostomy status: Secondary | ICD-10-CM | POA: Diagnosis not present

## 2016-09-30 DIAGNOSIS — C50212 Malignant neoplasm of upper-inner quadrant of left female breast: Secondary | ICD-10-CM | POA: Diagnosis present

## 2016-09-30 DIAGNOSIS — G8250 Quadriplegia, unspecified: Principal | ICD-10-CM | POA: Diagnosis present

## 2016-09-30 DIAGNOSIS — Z881 Allergy status to other antibiotic agents status: Secondary | ICD-10-CM

## 2016-09-30 DIAGNOSIS — Z681 Body mass index (BMI) 19 or less, adult: Secondary | ICD-10-CM

## 2016-09-30 DIAGNOSIS — J398 Other specified diseases of upper respiratory tract: Secondary | ICD-10-CM

## 2016-09-30 DIAGNOSIS — R509 Fever, unspecified: Secondary | ICD-10-CM

## 2016-09-30 DIAGNOSIS — J40 Bronchitis, not specified as acute or chronic: Secondary | ICD-10-CM | POA: Diagnosis present

## 2016-09-30 DIAGNOSIS — E43 Unspecified severe protein-calorie malnutrition: Secondary | ICD-10-CM | POA: Diagnosis not present

## 2016-09-30 DIAGNOSIS — C50919 Malignant neoplasm of unspecified site of unspecified female breast: Secondary | ICD-10-CM | POA: Diagnosis present

## 2016-09-30 DIAGNOSIS — K5900 Constipation, unspecified: Secondary | ICD-10-CM | POA: Diagnosis present

## 2016-09-30 DIAGNOSIS — R5381 Other malaise: Secondary | ICD-10-CM | POA: Diagnosis not present

## 2016-09-30 DIAGNOSIS — Z931 Gastrostomy status: Secondary | ICD-10-CM | POA: Diagnosis not present

## 2016-09-30 DIAGNOSIS — I10 Essential (primary) hypertension: Secondary | ICD-10-CM | POA: Diagnosis present

## 2016-09-30 DIAGNOSIS — E876 Hypokalemia: Secondary | ICD-10-CM | POA: Diagnosis present

## 2016-09-30 DIAGNOSIS — B373 Candidiasis of vulva and vagina: Secondary | ICD-10-CM | POA: Diagnosis not present

## 2016-09-30 DIAGNOSIS — R339 Retention of urine, unspecified: Secondary | ICD-10-CM | POA: Diagnosis present

## 2016-09-30 DIAGNOSIS — Z8661 Personal history of infections of the central nervous system: Secondary | ICD-10-CM

## 2016-09-30 DIAGNOSIS — R29898 Other symptoms and signs involving the musculoskeletal system: Secondary | ICD-10-CM | POA: Diagnosis present

## 2016-09-30 LAB — BASIC METABOLIC PANEL
Anion gap: 9 (ref 5–15)
BUN: 6 mg/dL (ref 6–20)
CALCIUM: 9 mg/dL (ref 8.9–10.3)
CO2: 30 mmol/L (ref 22–32)
CREATININE: 0.32 mg/dL — AB (ref 0.44–1.00)
Chloride: 92 mmol/L — ABNORMAL LOW (ref 101–111)
GFR calc Af Amer: >60 mL/min (ref >60)
GLUCOSE: 138 mg/dL — AB (ref 65–99)
Potassium: 3.1 mmol/L — ABNORMAL LOW (ref 3.5–5.1)
SODIUM: 131 mmol/L — AB (ref 135–145)

## 2016-09-30 LAB — GLUCOSE, CAPILLARY
GLUCOSE-CAPILLARY: 111 mg/dL — AB (ref 65–99)
Glucose-Capillary: 151 mg/dL — ABNORMAL HIGH (ref 65–99)

## 2016-09-30 MED ORDER — FREE WATER: 100.0000 mL | Freq: Three times a day (TID) | Status: DC

## 2016-09-30 MED ORDER — MUPIROCIN 2 % EX OINT
1.0000 "application " | TOPICAL_OINTMENT | Freq: Two times a day (BID) | CUTANEOUS | Status: AC
  Administered 2016-09-30 – 2016-10-05 (×10): 1 via NASAL
  Filled 2016-09-30: qty 22

## 2016-09-30 MED ORDER — ACETAMINOPHEN 325 MG PO TABS: 325.0000 mg | ORAL_TABLET | ORAL | Status: DC | PRN

## 2016-09-30 MED ORDER — FREE WATER
50.0000 mL | Freq: Three times a day (TID) | Status: DC
  Administered 2016-09-30 – 2016-10-04 (×9): 50 mL

## 2016-09-30 MED ORDER — SACCHAROMYCES BOULARDII 250 MG PO CAPS
250.0000 mg | ORAL_CAPSULE | Freq: Every day | ORAL | Status: DC
  Administered 2016-10-01 – 2016-10-27 (×27): 250 mg via ORAL
  Filled 2016-09-30 (×27): qty 1

## 2016-09-30 MED ORDER — TRAZODONE HCL 50 MG PO TABS
25.0000 mg | ORAL_TABLET | Freq: Every evening | ORAL | Status: DC | PRN
  Administered 2016-10-04 – 2016-10-24 (×8): 50 mg
  Filled 2016-09-30 (×8): qty 1

## 2016-09-30 MED ORDER — OSMOLITE 1.5 CAL PO LIQD: 1000.0000 mL | ORAL | 0 refills | Status: DC

## 2016-09-30 MED ORDER — ALUM & MAG HYDROXIDE-SIMETH 200-200-20 MG/5ML PO SUSP: 30.0000 mL | ORAL | Status: DC | PRN

## 2016-09-30 MED ORDER — ENOXAPARIN SODIUM 30 MG/0.3ML ~~LOC~~ SOLN
30.0000 mg | SUBCUTANEOUS | Status: DC
  Administered 2016-09-30 – 2016-10-26 (×27): 30 mg via SUBCUTANEOUS
  Filled 2016-09-30 (×27): qty 0.3

## 2016-09-30 MED ORDER — OSMOLITE 1.5 CAL PO LIQD
1000.0000 mL | ORAL | Status: DC
  Administered 2016-10-01: 1000 mL
  Filled 2016-09-30 (×2): qty 1000

## 2016-09-30 MED ORDER — AMLODIPINE 1 MG/ML ORAL SUSPENSION: 7.5000 mg | Freq: Every day | ORAL | Status: DC

## 2016-09-30 MED ORDER — DIAZEPAM 2 MG PO TABS
2.0000 mg | ORAL_TABLET | Freq: Every day | ORAL | Status: DC
  Administered 2016-09-30 – 2016-10-03 (×4): 2 mg
  Filled 2016-09-30 (×5): qty 1

## 2016-09-30 MED ORDER — DIPHENHYDRAMINE HCL 12.5 MG/5ML PO ELIX
12.5000 mg | ORAL_SOLUTION | Freq: Four times a day (QID) | ORAL | Status: DC | PRN
  Administered 2016-10-07: 25 mg via ORAL
  Filled 2016-09-30: qty 10

## 2016-09-30 MED ORDER — BACLOFEN 1 MG/ML ORAL SUSPENSION: 10.0000 mg | Freq: Four times a day (QID) | ORAL | Status: DC

## 2016-09-30 MED ORDER — FLEET ENEMA 7-19 GM/118ML RE ENEM: 1.0000 | ENEMA | Freq: Once | RECTAL | Status: DC | PRN

## 2016-09-30 MED ORDER — AMLODIPINE 1 MG/ML ORAL SUSPENSION
7.5000 mg | Freq: Every day | ORAL | Status: DC
  Administered 2016-10-01 – 2016-10-17 (×17): 7.5 mg
  Filled 2016-09-30 (×18): qty 7.5

## 2016-09-30 MED ORDER — PROCHLORPERAZINE 25 MG RE SUPP
12.5000 mg | Freq: Four times a day (QID) | RECTAL | Status: DC | PRN
  Filled 2016-09-30: qty 1

## 2016-09-30 MED ORDER — BISACODYL 10 MG RE SUPP: 10.0000 mg | Freq: Every day | RECTAL | 0 refills | Status: DC | PRN

## 2016-09-30 MED ORDER — ACETAMINOPHEN 325 MG PO TABS
650.0000 mg | ORAL_TABLET | Freq: Four times a day (QID) | ORAL | Status: DC | PRN
  Administered 2016-10-07 – 2016-10-15 (×3): 650 mg
  Filled 2016-09-30 (×3): qty 2

## 2016-09-30 MED ORDER — AMANTADINE HCL 50 MG/5ML PO SYRP
100.0000 mg | ORAL_SOLUTION | Freq: Two times a day (BID) | ORAL | Status: DC
  Administered 2016-09-30 – 2016-10-04 (×8): 100 mg
  Filled 2016-09-30 (×8): qty 10

## 2016-09-30 MED ORDER — BISACODYL 10 MG RE SUPP
10.0000 mg | Freq: Every day | RECTAL | Status: DC | PRN
  Administered 2016-10-03 – 2016-10-16 (×3): 10 mg via RECTAL
  Filled 2016-09-30 (×3): qty 1

## 2016-09-30 MED ORDER — BACLOFEN 1 MG/ML ORAL SUSPENSION
10.0000 mg | Freq: Four times a day (QID) | ORAL | Status: DC
  Administered 2016-09-30 – 2016-10-27 (×101): 10 mg
  Filled 2016-09-30 (×113): qty 1

## 2016-09-30 MED ORDER — VANCOMYCIN HCL IN DEXTROSE 750-5 MG/150ML-% IV SOLN
750.0000 mg | Freq: Three times a day (TID) | INTRAVENOUS | Status: DC
  Administered 2016-10-01 – 2016-10-04 (×7): 750 mg via INTRAVENOUS
  Filled 2016-09-30 (×15): qty 150

## 2016-09-30 MED ORDER — SODIUM CHLORIDE 0.9 % IV SOLN
INTRAVENOUS | Status: DC
  Administered 2016-10-01 – 2016-10-02 (×2): via INTRAVENOUS
  Administered 2016-10-04: 1000 mL via INTRAVENOUS

## 2016-09-30 MED ORDER — PROCHLORPERAZINE MALEATE 5 MG PO TABS: 5.0000 mg | ORAL_TABLET | Freq: Four times a day (QID) | ORAL | Status: DC | PRN

## 2016-09-30 MED ORDER — PROCHLORPERAZINE EDISYLATE 5 MG/ML IJ SOLN: 5.0000 mg | Freq: Four times a day (QID) | INTRAMUSCULAR | Status: DC | PRN

## 2016-09-30 MED ORDER — GUAIFENESIN 100 MG/5ML PO SOLN
10.0000 mL | Freq: Four times a day (QID) | ORAL | Status: DC
  Administered 2016-09-30 – 2016-10-27 (×103): 200 mg via ORAL
  Filled 2016-09-30 (×2): qty 10
  Filled 2016-09-30: qty 25
  Filled 2016-09-30 (×10): qty 10
  Filled 2016-09-30: qty 5
  Filled 2016-09-30 (×3): qty 10
  Filled 2016-09-30: qty 5
  Filled 2016-09-30 (×6): qty 10
  Filled 2016-09-30: qty 50
  Filled 2016-09-30: qty 5
  Filled 2016-09-30 (×18): qty 10
  Filled 2016-09-30: qty 5
  Filled 2016-09-30: qty 10
  Filled 2016-09-30: qty 25
  Filled 2016-09-30 (×4): qty 10
  Filled 2016-09-30: qty 5
  Filled 2016-09-30: qty 10
  Filled 2016-09-30: qty 5
  Filled 2016-09-30 (×3): qty 10
  Filled 2016-09-30 (×2): qty 5
  Filled 2016-09-30 (×4): qty 10
  Filled 2016-09-30: qty 5
  Filled 2016-09-30: qty 50
  Filled 2016-09-30 (×2): qty 10
  Filled 2016-09-30: qty 5
  Filled 2016-09-30 (×5): qty 10
  Filled 2016-09-30: qty 5
  Filled 2016-09-30 (×7): qty 10
  Filled 2016-09-30: qty 5
  Filled 2016-09-30 (×3): qty 10
  Filled 2016-09-30: qty 5
  Filled 2016-09-30: qty 10
  Filled 2016-09-30: qty 50
  Filled 2016-09-30 (×12): qty 10
  Filled 2016-09-30 (×2): qty 5
  Filled 2016-09-30 (×2): qty 10
  Filled 2016-09-30 (×2): qty 5
  Filled 2016-09-30: qty 25

## 2016-09-30 MED ORDER — POTASSIUM CHLORIDE 20 MEQ PO PACK
20.0000 meq | PACK | Freq: Two times a day (BID) | ORAL | Status: DC
  Administered 2016-09-30 – 2016-10-01 (×2): 20 meq via ORAL
  Filled 2016-09-30 (×2): qty 1

## 2016-09-30 MED ORDER — DOXYCYCLINE MONOHYDRATE 25 MG/5ML PO SUSR: 50.0000 mg | Freq: Two times a day (BID) | ORAL | 0 refills | Status: DC

## 2016-09-30 NOTE — Progress Notes (Signed)
Report given to floor RT.

## 2016-09-30 NOTE — Progress Notes (Signed)
Came to room for trach check and CPT.  RN x 2 in room, assessing IV status, and cleaning up pt currently.  CPT not done currently d/t pt unavailable.  RN/family aware.

## 2016-09-30 NOTE — Progress Notes (Signed)
I have insurance approval for an inpt rehab admission. I discussed with Dr. Quincy Simmonds and we await Sodium level this morning before determining d/c. RN CM and SW are aware as well as caregiver at bedside. [phone]

## 2016-09-30 NOTE — Progress Notes (Signed)
Judy Gong, RN Rehab Admission Coordinator Signed Physical Medicine and Rehabilitation  PMR Pre-admission Date of Service: 09/30/2016 9:17 AM  Related encounter: ED to Hosp-Admission (Current) from 09/24/2016 in [place]       [] Hide copied text PMR Admission Coordinator Pre-Admission Assessment  Patient: Judy Spears is an 66 y.o., female MRN: [id number] DOB: 17-Mar-1950 Height:   Weight: 44.2 kg (97 lb 6.4 oz)                                                                                                                                                  Insurance Information HMO:     PPO: YES     PCP:      IPA:      80/20:      OTHER: Spears advantage plan PRIMARY: Judy Spears      Policy#: [id number]      Subscriber: pt CM Name: Judy Spears      Phone#: [phone]     Fax#: [phone] Pre-Cert#: [id number] approved for 7 days with f/u with Judy Spears phone [phone] fax [phone]      Employer: disabled Benefits:  Phone #: online     Name: 09/29/16 Eff. Date: 08/25/16     Deduct: none      Out of Pocket Max: $4000      Life Max: none CIR: $160 co pay per day days 1-10 then insurance covers 100%      SNF: no co pay days 1-20; $50 co pay per day days 21-100 Outpatient: $20 co pay per visit     Co-Pay: visits per medical neccesity Home Health: 100%      Co-Pay: visits per medical neccesity DME: 80%     Co-Pay: 20% Providers: in network  SECONDARY: none        Medicaid Application Date:       Case Manager:  Disability Application Date:       Case Worker:   Emergency Tax adviser Information    Name Relation Home Work Jennings T [place] Chaves    Gypsy Balsam Daughter [phone]     Irving Shows [phone]       Current Medical History  Patient Admitting Diagnosis: debility  History of Present Illness: HPI: Judy Spears a 66  y.o.femalewith 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  She was admitted on 09/24/16 with lethargy. SOB and low grade fevers due to MRSA tracheobronchitis v/s PNA. She was started on IV vancomycin as well as chest PT. Respiratory status improving with treatment and foley placed due to urinary retention. PEG tube evaluated by IVR and was exchanged  out on 9/5. Secretions are improving and PCCM recommends chest PT to help with mobilization of secretions. Progressive hyponatremia noted and water flushes Foley discontinued yesterday and has required I/O caths for volumes 400- 600 cc. Patient has had significant decline in mobility and ability to carryout ADL tasks.  Past Medical History      Past Medical History:  Diagnosis Date  . Arthritis    lt great toe  . Complication of anesthesia    pt has had headaches post op-did advise to hydra well preop  . Hair loss    right-sided  . History of exercise stress test    a. ETT (12/15) with 12:00 exercise, no ST changes, occasional PACs.   . Hyperlipidemia   . Insomnia   . Migraine headache   . Movement disorder    resltess in left legs  . Postmenopausal   . 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 Premature atrial contractions    a. Holter (12/15) with 8% PACs, no atrial runs or atrial fibrillation.     Family History  family history includes Colon cancer in her father; Heart attack (age of onset: 47) in her maternal grandfather; Hypertension in her mother; Prostate cancer in her father; Stroke in her mother; Stroke (age of onset: 42) in her maternal uncle.  Prior Rehab/Hospitalizations:  Has the patient had major surgery during 100 days prior to admission? Yes   Patient initially dx  November 2016 with encephalitis in [place], [place]. Transferred to [facility] acute  hospital. Admitted to [facility] in [place]. Was 5 months inpt and 2 months [place]. Discharged home with spouse and caregivers in  07/2015.  CIR 01/2016 as admission from [place] in [facility]  01/2016. Has been receiving Out pt rehab at [facility] twice weekly PT and OT.  Current Medications   Current Facility-Administered Medications:  .  0.9 %  sodium chloride infusion, , Intravenous, Continuous, Judy Spears, Judy Grief, MD, Last Rate: 75 mL/hr at 09/30/16 0423 .  acetaminophen (TYLENOL) tablet 650 mg, 650 mg, Per Tube, Q6H PRN, Judy Noel, MD, 650 mg at 09/25/16 0707 .  amantadine (SYMMETREL) 50 MG/5ML solution 100 mg, 100 mg, Per Tube, BID, Judy Colace, NP, 100 mg at 09/30/16 1035 .  amLODipine (NORVASC) 1 mg/mL oral suspension 7.5 mg, 7.5 mg, Per Tube, Daily, Judy Farmer, MD, 7.5 mg at 09/30/16 1034 .  baclofen (LIORESAL) 10 mg/mL oral suspension 10 mg, 10 mg, Per Tube, QID, Judy Colace, NP, 10 mg at 09/30/16 1035 .  bisacodyl (DULCOLAX) suppository 10 mg, 10 mg, Rectal, Daily PRN, Judy Person, NP .  diazepam (VALIUM) tablet 2 mg, 2 mg, Per Tube, QHS, Judy Maffucci B, MD, 2 mg at 09/29/16 2316 .  feeding supplement (OSMOLITE 1.5 CAL) liquid 1,000 mL, 1,000 mL, Per Tube, Q24H, Judy Person, NP, Last Rate: 40 mL/hr at 09/29/16 2320, 1,000 mL at 09/29/16 2320 .  free water 50 mL, 50 mL, Per Tube, Q8H, Judy Spears, Edwin, MD, 50 mL at 09/30/16 0600 .  guaiFENesin (ROBITUSSIN) 100 MG/5ML solution 200 mg, 10 mL, Oral, Q6H, Judy Spears, Judy B, MD, 200 mg at 09/30/16 1035 .  heparin injection 5,000 Units, 5,000 Units, Subcutaneous, Q8H, Judy Spears, Judy M, NP, 5,000 Units at 09/30/16 0608 .  lidocaine (XYLOCAINE) 2 % viscous mouth solution, , Mouth/Throat, PRN, Judy Mariscal, MD, 15 mL at 09/29/16 0905 .  saccharomyces boulardii (FLORASTOR) capsule 250 mg, 250 mg, Oral, Daily, Judy Spears, Judy Chandler, NP, 250  mg at 09/30/16 1033 .  vancomycin  (VANCOCIN) IVPB 750 mg/150 ml premix, 750 mg, Intravenous, Q8H, Judy Spears, Judy Spears, Stopped at 09/30/16 3500  Patients Current Diet: PEG feeds usually at night began at 5 pm until morning. NPO. Trach #4 cuff less at home capped until 3 days prior to admission and then uncapped due to secretions.. Now 28 % trach collar. Has not used PMSV this admission.  Precautions / Restrictions Precautions Precautions: Fall Precaution Comments: Watch O2 sats  Restrictions Weight Bearing Restrictions: No   Has the patient had 2 or more falls or a fall with injury in the past year?No  Prior Activity Level Was using walker to ambulate until end of June. Lumpectomy 08/03/16 and with oral chemo treatment family found pt with a decline in function. Pt had just been at beach for a week with family, and upon return pt febrile, constipated and congested. Pt typically dependent with all adls. uses lift chair to come down to her manual wheelchair and is up in chair most of day. [place] caregiver is with pt 8-4 30 pm daily. Other caregivers are through [place] and provide 24/7 assist. Pt uses handicapped accessible van and does outpt therapy twice weekly. Trach capped at home until 3 days pta when pt had low grade fever and increased secretions.  Home Assistive Devices / Equipment Home Assistive Devices/Equipment: Wheelchair, Vent/Trach supplies, Hospital bed, Shower chair with back, Raised toilet seat with rails, Grab bars around toilet, Bedside commode/3-in-1 Home Equipment: Hospital bed, Wheelchair - manual, Tub bench, Hand held shower head, Bedside commode, Walker - 2 wheels (shower W/C, Stedy)  Prior Device Use: Indicate devices/aids used by the patient prior to current illness, exacerbation or injury? Manual wheelchair, walker and Stedy for transfers as needed  Prior Functional Level Prior Function Level of Independence: Needs assistance Gait / Transfers Assistance Needed: Had been using  stedy the last couple of months to get to [facility] as pt has declined.  ADL's / Homemaking Assistance Needed: max assist with bathing, dressing; but up until 2 months ago she was washing her whole UB and thighs with setup/S Communication / Swallowing Assistance Needed: pt did shake her head yes and mouthed "hello" to me  Self Care: Did the patient need help bathing, dressing, using the toilet or eating?  Dependent  Indoor Mobility: Did the patient need assistance with walking from room to room (with or without device)? Dependent  Stairs: Did the patient need assistance with internal or external stairs (with or without device)? Dependent  Functional Cognition: Did the patient need help planning regular tasks such as shopping or remembering to take medications? Dependent  Current Functional Level Cognition  Overall Cognitive Status: Within Functional Limits for tasks assessed Orientation Level: Intubated/Tracheostomy - Unable to assess General Comments: Pt with no verbal responses noted but maintain eye contact with therapist 75% of the time.     Extremity Assessment (includes Sensation/Coordination)  Upper Extremity Assessment: LUE deficits/detail, RUE deficits/detail RUE Deficits / Details: continously fidgets/moves digits; guarding movements with AAROM/PROM (do not think it is in response to pain--but rather movements she cannot control); husband reports she has a hand splint at night  RUE Coordination: decreased fine motor, decreased gross motor LUE Deficits / Details: continously fidgets/moves digits; guarding movements with AAROM/PROM (may due to pain for elbow ROM due to contractures, but other guarding not necessarily due pain; husband reports she has a JAS splint that she should wear 3x/day for 20 minutes and a resting  elbow splint to wear at night LUE Coordination: decreased fine motor, decreased gross motor  Lower Extremity Assessment: LLE deficits/detail, Generalized  weakness LLE Deficits / Details: Very guarded vs. contracture in LLE; ER with knee flexion while laying in bed.      ADLs  Overall ADL's : Needs assistance/impaired Eating/Feeding: NPO General ADL Comments: Pt's spouse and daughter present during session.  Applied elbow lock out splint on the LUE with lock adjusted to 65 degrees extension.  Applying small stretch to left elbow flexors.  Educated family and nursing for pt to wear this splint all the time for now and we can work on making small adjustments to increase flexion while on inpatient stay.  Family is familiar with donning and doffing as pt was wearing this splint at night prior to this admission.  Did not apply Jas splint this session.  Feel pt is OK with small increments of the fixed elbow splint at this time and that is will be more tolerable to wear for longer durations.  Family and nursing educated on skin inspection at pressure areas as well.  Took pt's resting hand splint that she was wearing on the right hand at night and added support as it was cracking in the palm area.  Also applied new velcro to two locations.  Was not able to apply splint at this time secondary to IV site being in the posterior aspect of the forearm.  Discussed with nursing possibly moving IV sit so pt can wear splint.  Pt still tolerating palm guards bilaterally at this time.      Mobility  Overal bed mobility: Needs Assistance Bed Mobility: Rolling, Supine to Sit Rolling: Total assist (with increased time and cues for sequencing) Supine to sit: Total assist, +2 for physical assistance Sit to supine: Total assist, +2 for physical assistance General bed mobility comments: Total assist +2 for bed mobility. Very guarded with movement.    Transfers  General transfer comment: Attempted transfer via steady. Caregiver reports pt was using steady at home before decline. At this time pt unable to reach to steady bar to pull up. Offered to lift pt via hoyer lift,  however pt declined and was returned to bed.    Ambulation / Gait / Stairs / Office manager / Balance Dynamic Sitting Balance Sitting balance - Comments: Pt sat EOB in prep for transfer. Total assist provided in sitting to maintain balance and upright posture. Balance Overall balance assessment: Needs assistance Sitting-balance support: No upper extremity supported, Feet supported Sitting balance-Leahy Scale: Zero Sitting balance - Comments: Pt sat EOB in prep for transfer. Total assist provided in sitting to maintain balance and upright posture.    Special needs/care consideration BiPAP/CPAP  N/a CPM  N/a Continuous Drip IV yes Dialysis  N/a Life Vest  N/a Chest PT machine prn Oxygen 28 % trach collar Special Bed  N/a Trach Size # 4 cuff less trach. PMSV not used this admission. Infrequent suctioning needed Wound Vac n/a Skin  Buttocks with 1 cm length and 0.5 cm width stage 1 noted 09/27/2016. Labia with moisture associated skin changes. Right hip with foam dressing noted . Bowel mgmt: incontinent LBM 09/30/16. Constipation issues week pta and pt had an enema to resolve constipation Bladder mgmt: foley removed 09/29/16 ; I and O cath 09/30/16 Diabetic mgmt n/a Peg replaced 09/29/17 to 22 french which is leaking. No clamp which family requested. RN on acute hospital to contact IR 09/30/16  to discuss leaking issues. Contact precautions    Previous Home Environment Living Arrangements: Spouse/significant other  Lives With: Spouse Available Help at Discharge: Family, Personal care attendant, Available 24 hours/day Type of Home: House Home Layout: Two level, Bed/bath upstairs Alternate Level Stairs-Number of Steps: chair lift Home Access: Ramped entrance Bathroom Shower/Tub: Multimedia programmer: Handicapped height Bathroom Accessibility: Yes How Accessible: Accessible via wheelchair, Accessible via [place]: Yes Type of [place]: Homehealth aide, [place] (if known): neuro rehab outpatient center for OT, PT/Nurse Care of [employer] (Nurse care [place] for [employer]) Additional Comments: Pt's husband provided all info about home   Discharge Living Setting Plans for Discharge Living Setting: Patient's home, Lives with (comment) (spouse) Type of Home at Discharge: House Discharge Home Layout: Two level, Bed/bath upstairs Alternate Level Stairs-Rails:  (chair lift) Discharge Home Access: Ramped entrance Discharge Bathroom Shower/Tub: Walk-in shower Discharge Bathroom Toilet: Handicapped height Discharge Bathroom Accessibility: Yes How Accessible: Accessible via wheelchair, Accessible via walker Does the patient have any problems obtaining your medications?: No  Social/Family/Support Systems Patient Roles: Spouse, Parent Contact Information: [name], spouse Anticipated Caregiver: spouse and caregiver, Leesa Anticipated Caregiver's Contact Information: see above Ability/Limitations of Caregiver: none; spouse works Careers adviser: 24/7 Discharge Plan Discussed with Primary Caregiver: Yes Is Caregiver In Agreement with Plan?: Yes Does Caregiver/Family have Issues with Lodging/Transportation while Pt is in Rehab?: No  Goals/Additional Needs Patient/Family Goal for Rehab: Mod to max assist at wheelchair level Expected length of stay: ELOS 10- 14 days Pt/Family Agrees to Admission and willing to participate: Yes Program Orientation Provided & Reviewed with Pt/Caregiver Including Roles  & Responsibilities: Yes  Decrease burden of Care through IP rehab admission: n/a  Possible need for SNF placement upon discharge: not anticipated  Patient Condition: This patient's medical and functional status has changed since the consult dated: 09/28/2016 in which the Rehabilitation Physician determined and documented that the patient's condition is appropriate for intensive  rehabilitative care in an inpatient rehabilitation facility. See "History of Present Illness" (above) for medical update. Functional changes are: total assist. Patient's medical and functional status update has been discussed with the Rehabilitation physician and patient remains appropriate for inpatient rehabilitation. Will admit to inpatient rehab today.  Preadmission Screen Completed By:  Cleatrice Burke, 09/30/2016 11:57 AM ______________________________________________________________________   Discussed status with Dr. Letta Pate on 09/30/2016 at  1157 and received telephone approval for admission today.  Admission Coordinator:  Cleatrice Burke, time 0092 Date 09/30/2016       Cosigned by: Charlett Blake, MD at 09/30/2016 12:59 PM  Revision History

## 2016-09-30 NOTE — Progress Notes (Signed)
Pharmacy Antibiotic Note  Judy Spears is a 66 y.o. female admitted on 09/30/2016 with MRSA PNA.  Pharmacy has been consulted for vancomycin dosing.  Continues on day #7 of vancomycin for MRSA tracheobronchitis. No recommendation on LOT yet. Has not had therapeutic level yet. Last dose of vanc was earlier today at 1630. Afebrile, WBC wnl.  Plan: Continue vancomycin 750mg  IV Q8h Monitor clinical picture, renal function, VT prn F/U C&S, abx deescalation / LOT  Consider 10-14 days of vancomycin or consider switching to doxycycline to finish up course. Seems like allergy may be due to photosensitivity and not true allergy  Height: 5\' 2"  (157.5 cm) (per husband ) Weight: 92 lb 9.6 oz (42 kg) IBW/kg (Calculated) : 50.1  Temp (24hrs), Avg:98 F (36.7 C), Min:97.2 F (36.2 C), Max:98.4 F (36.9 C)   Recent Labs Lab 09/24/16 1640 09/24/16 1700 09/25/16 0559 09/27/16 0325 09/28/16 0925 09/29/16 0533 09/30/16 0955  WBC 9.9  --  8.4  --   --  7.4  --   CREATININE 0.36*  --  0.35* 0.33* <0.30* <0.30* 0.32*  LATICACIDVEN  --  1.09  --   --   --   --   --   VANCOTROUGH  --   --   --   --  6*  --   --     Estimated Creatinine Clearance: 46.5 mL/min (A) (by C-G formula based on SCr of 0.32 mg/dL (L)).    Allergies  Allergen Reactions  . Cefepime Other (See Comments)    Status epilepticus- this reaction is questionable per husband.States that it was determined that pt did not have seizure activity. Status epilepticus  . Tetracyclines & Related Photosensitivity and Other (See Comments)    Blisters  . Doxycycline Monohydrate     Blisters, photosensitivity- family state that the reaction is actually to tetracycline.  . Pollen Extract Other (See Comments)    Running nose  . Tetracycline Other (See Comments) and Photosensitivity    Blisters as a child.    Thank you for allowing pharmacy to be a part of this patient's care.  Reginia Naas 09/30/2016 6:15 PM

## 2016-09-30 NOTE — Progress Notes (Signed)
I am admitting pt to inpt rehab today. Discussed lab results with Dr. Quincy Simmonds. [phone]

## 2016-09-30 NOTE — Progress Notes (Signed)
Patel, Ankit Anil, MD Physician Signed Physical Medicine and Rehabilitation  Consult Note Date of Service: 09/28/2016 11:18 AM  Related encounter: ED to Hosp-Admission (Current) from 09/24/2016 in [facility] 5W MEDICAL     Expand All Collapse All   []Hide copied text []Hover for attribution information      Physical Medicine and Rehabilitation Consult   Reason for Consult Debility Referring Physician: Dr. McQuaid   HPI: Judy Spears is a 66 y.o. 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 History taken from patient and daughters. Patient known to CIR from prior stay and was involved in outpatient therapy.  She was admitted on 09/24/16 with lethargy. SOB and low grade fevers due to MRSA tracheobronchitis v/Spears PNA. She was started on IV vancomycin as well as chest PT.  Respiratory status improving with treatment and foley placed due to urinary retention. PEG tube evaluated by IVR per family request--may be exchanged on outpatient basis. Patient has had significant decline in mobility and ability to carryout ADL tasks. CIR recommended by rehab team.   Review of Systems  Unable to perform ROS: Medical condition       Past Medical History:  Diagnosis Date  . Arthritis    lt great toe  . Complication of anesthesia    pt has had headaches post op-did advise to hydra well preop  . Hair loss    right-sided  . History of exercise stress test    a. ETT (12/15) with 12:00 exercise, no ST changes, occasional PACs.   . Hyperlipidemia   . Insomnia   . Migraine headache   . Movement disorder    resltess in left legs  . Postmenopausal   . 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 Premature atrial contractions    a. Holter (12/15) with 8% PACs, no atrial runs or atrial fibrillation.        Past Surgical History:  Procedure Laterality Date  . BREAST BIOPSY  01/01/2011   Procedure: BREAST BIOPSY WITH NEEDLE LOCALIZATION;  Surgeon: Matthew Wakefield, MD;  Location: [facility];  Service: General;  Laterality: Left;  left breast wire localization biopsy  . BREAST BIOPSY Right 07/27/2012  . BREAST BIOPSY Right 07/07/2016  . BREAST BIOPSY Left 06/30/2016  . cataracts right eye    . CESAREAN SECTION  1986  . HERNIA REPAIR  2000   [facility]  . opirectinal membrane peel    . PEG PLACEMENT    . RHINOPLASTY  1983  . TRACHEOSTOMY           Family History  Problem Relation Age of Onset  . Stroke Mother        paralyzed on right side/aphasia  . Hypertension Mother        had stroke after stopping BP meds  . Colon cancer Father   . Prostate cancer Father   . Heart attack Maternal Grandfather 56  . Stroke Maternal Uncle 50    Social History:  Married.  Per reports that she has never smoked. She has never used smokeless tobacco.Per reports that she drinks alcohol on occasion. She reports that she does not use drugs.         Allergies  Allergen Reactions  . Cefepime Other (See Comments)    Status epilepticus- this reaction is questionable per husband.States that it was determined that pt did not have   seizure activity. Status epilepticus  . Tetracyclines & Related Photosensitivity and Other (See Comments)    Blisters  . Doxycycline Monohydrate     Blisters, photosensitivity- family state that the reaction is actually to tetracycline.  . Pollen Extract Other (See Comments)    Running nose  . Tetracycline Other (See Comments) and Photosensitivity    Blisters as a child.          Medications Prior to Admission  Medication Sig Dispense Refill  . amantadine (SYMMETREL) 50 MG/5ML solution Place 10 mLs (100 mg total) into feeding tube 2 (two) times daily. In am and at noon (Patient taking differently: Place 150 mg into feeding  tube 2 (two) times daily. In am and at noon ) 473 mL 0  . amLODipine (NORVASC) 5 MG tablet PLACE ONE TABLET INTO FEEDING TUBE DAILY 30 tablet 1  . atorvastatin (LIPITOR) 20 MG tablet Place 20 mg into feeding tube at bedtime.     . baclofen (LIORESAL) 10 MG tablet Place 1 tablet (10 mg total) into feeding tube every 6 (six) hours. (Patient taking differently: Place 10 mg into feeding tube every 6 (six) hours. 8am 12noon 8 pm) 120 each 5  . chlorhexidine gluconate, MEDLINE KIT, (PERIDEX) 0.12 % solution 15 mLs by Mouth Rinse route 2 (two) times daily. (Patient taking differently: 5 mLs by Mouth Rinse route 2 (two) times daily. ) 120 mL 0  . Coenzyme Q10 (COQ10 PO) Take 10 mLs by mouth at bedtime.    [name] CRANBERRY JUICE EXTRACT PO Take by mouth daily. In the morning    . diazepam (VALIUM) 2 MG tablet PLACE 1 TABLET INTO FEEDING TUBE AT BEDTIME (Patient taking differently: PLACE 2 mg TABLET INTO FEEDING TUBE AT BEDTIME) 30 tablet 2  . docusate (COLACE) 60 MG/15ML syrup Place 100 mg into feeding tube 2 (two) times daily. Takes twice a day but can take 1 addt'l dose as needed for constipation    . famotidine (PEPCID) 20 MG tablet Take 1 tablet (20 mg total) by mouth 2 (two) times daily. 60 tablet 3  . fluticasone (FLONASE) 50 MCG/ACT nasal spray Place 1 spray into both nostrils 2 (two) times daily.    [name] gabapentin (NEURONTIN) 100 MG capsule Take 1 capsule (100 mg total) by mouth 3 (three) times daily. (Patient taking differently: Take 100 mg by mouth 3 (three) times daily. Feeding tube) 90 capsule 3  . guaiFENesin (ROBITUSSIN) 100 MG/5ML SOLN Place 10 mLs (200 mg total) into feeding tube 4 (four) times daily. (Patient taking differently: Place 10 mLs into feeding tube as needed for to loosen phlegm. ) 1200 mL 0  . loratadine (CLARITIN) 10 MG tablet Place 1 tablet (10 mg total) into feeding tube daily as needed for allergies. (Patient taking differently: Place 10 mg into feeding tube every morning.  daily)    . Melatonin 5 MG CAPS Place 5 mg into feeding tube every evening.     . Multiple Vitamin (MULTIVITAMIN) LIQD Place 15 mLs into feeding tube daily. 1 Bottle 3  . nystatin (MYCOSTATIN/NYSTOP) powder Apply 1 g topically 2 (two) times daily as needed. (Patient taking differently: Apply 1 g topically 2 (two) times daily as needed (irritation). ) 45 g 0  . potassium chloride 20 MEQ/15ML (10%) SOLN Place 15 mLs (20 mEq total) into feeding tube daily. 900 mL 5  . Probiotic Product (PROBIOTIC ADVANCED) CAPS Take 1 capsule by mouth daily.    . prochlorperazine (COMPAZINE) 5 MG tablet Place 1-2 tablets (  5-10 mg total) into feeding tube every 6 (six) hours as needed for nausea. 30 tablet 0  . simethicone (MYLICON) 40 RD/4.0CX drops Place 0.6 mLs (40 mg total) into feeding tube 4 (four) times daily as needed for flatulence. 45 mL 0  . UNABLE TO FIND Med Name: Good Belly probiotic 5oz every morning    . valACYclovir (VALTREX) 500 MG tablet PLACE 1 TABLET INTO FEEDING TUBE DAILY (Patient taking differently: PLACE 500 mg TABLET INTO FEEDING TUBE DAILY) 30 tablet 1  . Water For Irrigation, Sterile (FREE WATER) SOLN Place 150 mLs into feeding tube See admin instructions. Takes 149m 5 times daily, if over 2052mhold water and recheck within an hour    . Hydrocortisone (GERHARDT'Spears BUTT CREAM) CREA Apply 1 application topically 4 (four) times daily. (Patient taking differently: Apply 1 application topically as needed for irritation. Rear end) 1 each 0    Home: Home Living Family/patient expects to be discharged to:: Inpatient rehab Living Arrangements: Spouse/significant other Available Help at Discharge: Family, Personal care attendant, Available 24 hours/day Type of Home: House Home Access: Ramped entrance Home Layout: Two level, Bed/bath upstairs Alternate Level Stairs-Number of Steps: chair lift Bathroom Shower/Tub: WaMultimedia programmerHandicapped height Home Equipment:  Hospital bed, Wheelchair - manual, Tub bench, Hand held shower head, Bedside commode, Walker - 2 wheels (shower W/C, Stedy) Additional Comments: Pt'Spears husband provided all info about home   Functional History: Prior Function Level of Independence: Needs assistance Gait / Transfers Assistance Needed: [facility] the last couple [facility].  ADL'Spears / Homemaking Assistance Needed: max assist with bathing, dressing; but up until 2 months ago she was washing her whole UB and thighs with setup/Spears Communication / Swallowing Assistance Needed: pt did shake her head yes and mouthed "hello" to me Functional Status:  Mobility: Bed Mobility Overal bed mobility: Needs Assistance Bed Mobility: Rolling Rolling: Max assist (with increased time and cues for sequencing) Supine to sit: Total assist, +2 for physical assistance Sit to supine: Total assist, +2 for physical assistance General bed mobility comments: Total assist +2 for bed mobility. Very guarded with movement this session.   ADL: ADL Overall ADL'Spears : Needs assistance/impaired Eating/Feeding: NPO General ADL Comments: currently total A for all basic ADLs, but not so 2 months ago.  Cognition: Cognition Overall Cognitive Status: Within Functional Limits for tasks assessed Orientation Level: Intubated/Tracheostomy - Unable to assess Cognition Arousal/Alertness: Awake/alert Behavior During Therapy: WFL for tasks assessed/performed Overall Cognitive Status: Within Functional Limits for tasks assessed General Comments: slow reponse time to commands   Blood pressure (!) 145/99, pulse 87, temperature 98.3 F (36.8 C), temperature source Oral, resp. rate 18, weight 45.8 kg (101 lb), last menstrual period 12/01/2010, SpO2 98 %. Physical Exam  Vitals reviewed. Constitutional: She appears well-developed.  Cachectic  HENT:  Head: Normocephalic and atraumatic.  Eyes: EOM are normal. Right eye exhibits no  discharge. Left eye exhibits no discharge.  Neck:  +Trach  Cardiovascular: Normal rate and regular rhythm.   Respiratory: Effort normal and breath sounds normal.  GI: Soft. Bowel sounds are normal.  +PEG  Musculoskeletal: She exhibits no edema or tenderness.  Neurological: She is alert.  Motor exam limited due to contractures and patient resistance in all 4s Tremors noted  Skin: Skin is warm and dry.  Psychiatric: She is slowed. Cognition and memory are impaired.    Lab Results Last 24 Hours  Results for orders placed or performed during the hospital encounter of 09/24/16 (from the past 24 hour(Spears))  Basic metabolic panel     Status: Abnormal   Collection Time: 09/28/16  9:25 AM  Result Value Ref Range   Sodium 129 (L) 135 - 145 mmol/L   Potassium 3.3 (L) 3.5 - 5.1 mmol/L   Chloride 91 (L) 101 - 111 mmol/L   CO2 27 22 - 32 mmol/L   Glucose, Bld 123 (H) 65 - 99 mg/dL   BUN 6 6 - 20 mg/dL   Creatinine, Ser <0.30 (L) 0.44 - 1.00 mg/dL   Calcium 8.7 (L) 8.9 - 10.3 mg/dL   GFR calc non Af Amer NOT CALCULATED >60 mL/min   GFR calc Af Amer NOT CALCULATED >60 mL/min   Anion gap 11 5 - 15  Vancomycin, trough     Status: Abnormal   Collection Time: 09/28/16  9:25 AM  Result Value Ref Range   Vancomycin Tr 6 (L) 15 - 20 ug/mL     Imaging Results (Last 48 hours)  No results found.    Assessment/Plan: Diagnosis: Debility Labs independently reviewed.  Records reviewed and summated above.  1. Does the need for close, 24 hr/day medical supervision in concert with the patient'Spears rehab needs make it unreasonable for this patient to be served in a less intensive setting? Yes 2. 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 Due to bladder management, bowel management, safety, skin/wound care, disease management, medication administration and patient education, does the patient require 24 hr/day rehab nursing? Yes 4. Does the patient require coordinated care of a physician, rehab nurse, PT (1-2 hrs/day, 5 days/week), OT (1-2 hrs/day, 5 days/week) and SLP (1-2 hrs/day, 5 days/week) to address physical and functional deficits in the context of the above medical diagnosis(es)? Yes Addressing deficits in the following areas: balance, endurance, locomotion, strength, transferring, bowel/bladder control, bathing, dressing, feeding, grooming, toileting, cognition, speech, language, swallowing and psychosocial support 5. Can the patient actively participate in an intensive therapy program of at least 3 hrs of therapy per day at least 5 days per week? Potentially 6. The potential for patient to make measurable gains while on inpatient rehab is good 7. Anticipated functional outcomes upon discharge from inpatient rehab are mod assist and max assist  with PT, mod assist and max assist with OT, mod assist and max assist with SLP. 8. Estimated rehab length of stay to reach the above functional goals is: 20-25 days. 9. Anticipated D/C setting: Home 10. Anticipated post D/C treatments: HH therapy and Home excercise program 11. Overall Rehab/Functional Prognosis: fair  RECOMMENDATIONS: This patient'Spears condition is appropriate for continued rehabilitative care in the following setting: CIR when medically appropriate and able to tolerate 3 hours of therapy/day.   Patient has agreed to participate in recommended program. Potentially Note that insurance prior authorization may be required for reimbursement for recommended  care.  Comment: Rehab Admissions Coordinator to follow up.  Ankit Patel, MD, FAAPMR Judy Spears, Judy S, PA-C 09/28/2016    Revision History                          Routing History                    

## 2016-09-30 NOTE — Discharge Summary (Signed)
Physician Discharge Summary  Judy Spears  XBD:[id number]  DOB: Mar 28, 1950  DOA: 09/24/2016 PCP: Cari Caraway, MD  Admit date: 09/24/2016 Discharge date: 09/30/2016  Admitted From: Home Disposition:  CIR  Discharge Condition: Improved CODE STATUS: Full code Diet recommendation: Heart Healthy / Carb Modified / Regular / Dysphagia   Brief/Interim Summary: Judy Spears is a 66 year old female with history of [place] encephalitis with complicated course of PEG/trach dependent recent diagnosed with breast cancer status post lumpectomy being treated with anastrozole. Patient was admitted on 08/3116 with lethargy, increasing secretions from her trach and low-grade fever. She was admitted to the ICU workup revealed MRSA tracheobronchitis she was started on IV vancomycin and chest PT. Case discussed with infectious disease recommended to complete doxy for complete 14 days of antibiotic treatment. Her respiratory status has improved significantly. During hospital stay she developed urinary retention for which foley was placed. Subsequently Foley was removed and void trial was successful. PEG tube was evaluated by IR and was exchanged on 9/5. She developed progressive hyponatremia which improved after ingestion of free water intake. Patient has some significant decline in mobility and ability and physical therapy recommended CIR for rehabilitation.  Subjective: Patient seen and examined on the day of discharge. Patient is nonverbal, per caregiver's husband patient doing well. No acute events overnight, patient remains afebrile, decrease in secretions.   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  Patient was seen by physical therapy, recommending CIR  On the day of the discharge the patient's vitals were stable, and no other acute medical condition were reported by patient. the patient was felt safe to be discharge to [facility]   Discharge Instructions  You were cared for by a hospitalist during your hospital stay. If you have any questions about your discharge medications or the care you received while you were in the hospital after you are discharged, you can call the unit and asked to speak with the hospitalist on call if the hospitalist that took care of you is not available. Once you are discharged, your primary care physician will handle any further medical issues. Please note that NO REFILLS for any discharge medications will be authorized once you are discharged, as it is imperative that you return to your primary care physician (or establish a relationship with a primary care physician if you do not have one) for your aftercare needs so that they can reassess your need for medications and monitor your lab values.  Discharge Instructions    Call MD for:  difficulty breathing,  headache or visual disturbances    Complete by:  As directed    Call MD for:  extreme fatigue    Complete by:  As  directed    Call MD for:  hives    Complete by:  As directed    Call MD for:  persistant dizziness or light-headedness    Complete by:  As directed    Call MD for:  persistant nausea and vomiting    Complete by:  As directed    Call MD for:  redness, tenderness, or signs of infection (pain, swelling, redness, odor or green/yellow discharge around incision site)    Complete by:  As directed    Call MD for:  severe uncontrolled pain    Complete by:  As directed    Call MD for:  temperature >100.4    Complete by:  As directed    Diet - low sodium heart healthy    Complete by:  As directed    Increase activity slowly    Complete by:  As directed      Allergies as of 09/30/2016      Reactions   Cefepime Other (See Comments)   Status epilepticus- this reaction is questionable per husband.States that it was determined that pt did not have seizure activity. Status epilepticus   Tetracyclines & Related Photosensitivity, Other (See Comments)   Blisters   Doxycycline Monohydrate    Blisters, photosensitivity- family state that the reaction is actually to tetracycline.   Pollen Extract Other (See Comments)   Running nose   Tetracycline Other (See Comments), Photosensitivity   Blisters as a child.      Medication List    STOP taking these medications   amLODipine 5 MG tablet Commonly known as:  NORVASC Replaced by:  amLODipine 1 mg/mL Susp oral suspension   baclofen 10 MG tablet Commonly known as:  LIORESAL Replaced by:  baclofen 10 mg/mL Susp   gabapentin 100 MG capsule Commonly known as:  NEURONTIN   Gerhardt's butt cream Crea   nystatin powder Commonly known as:  MYCOSTATIN/NYSTOP     TAKE these medications   amantadine 50 MG/5ML solution Commonly known as:  SYMMETREL Place 10 mLs (100 mg total) into feeding tube 2 (two) times daily. In am and at noon What changed:  how much to take  additional instructions   amLODipine 1 mg/mL Susp oral suspension Commonly known  as:  NORVASC Place 7.5 mLs (7.5 mg total) into feeding tube daily. Replaces:  amLODipine 5 MG tablet   atorvastatin 20 MG tablet Commonly known as:  LIPITOR Place 20 mg into feeding tube at bedtime.   baclofen 10 mg/mL Susp Commonly known as:  LIORESAL Place 1 mL (10 mg total) into feeding tube 4 (four) times daily. Replaces:  baclofen 10 MG tablet   bisacodyl 10 MG suppository Commonly known as:  DULCOLAX Place 1 suppository (10 mg total) rectally daily as needed for moderate constipation.   chlorhexidine gluconate (MEDLINE KIT) 0.12 % solution Commonly known as:  PERIDEX 15 mLs by Mouth Rinse route 2 (two) times daily. What changed:  how much to take   COQ10 PO Take 10 mLs by mouth at bedtime.   CRANBERRY JUICE EXTRACT PO Take by mouth daily. In the morning   diazepam 2 MG tablet Commonly known as:  VALIUM PLACE 1 TABLET INTO FEEDING TUBE AT BEDTIME What changed:  See the new instructions.   docusate 60 MG/15ML syrup Commonly known as:  COLACE Place 100  mg into feeding tube 2 (two) times daily. Takes twice a day but can take 1 addt'l dose as needed for constipation   doxycycline 25 MG/5ML Susr Commonly known as:  VIBRAMYCIN Take 10 mLs (50 mg total) by mouth 2 (two) times daily.   famotidine 20 MG tablet Commonly known as:  PEPCID Take 1 tablet (20 mg total) by mouth 2 (two) times daily.   feeding supplement (OSMOLITE 1.5 CAL) Liqd Place 1,000 mLs into feeding tube daily.   fluticasone 50 MCG/ACT nasal spray Commonly known as:  FLONASE Place 1 spray into both nostrils 2 (two) times daily.   free water Soln Place 100 mLs into feeding tube every 8 (eight) hours. Takes 18m 5 times daily, if over 2059mhold water and recheck within an hour What changed:  how much to take  when to take this   guaiFENesin 100 MG/5ML Soln Commonly known as:  ROBITUSSIN Place 10 mLs (200 mg total) into feeding tube 4 (four) times daily. What changed:  when to take  this  reasons to take this   loratadine 10 MG tablet Commonly known as:  CLARITIN Place 1 tablet (10 mg total) into feeding tube daily as needed for allergies. What changed:  when to take this  additional instructions   Melatonin 5 MG Caps Place 5 mg into feeding tube every evening.   multivitamin Liqd Place 15 mLs into feeding tube daily.   potassium chloride 20 MEQ/15ML (10%) Soln Place 15 mLs (20 mEq total) into feeding tube daily.   PROBIOTIC ADVANCED Caps Take 1 capsule by mouth daily.   prochlorperazine 5 MG tablet Commonly known as:  COMPAZINE Place 1-2 tablets (5-10 mg total) into feeding tube every 6 (six) hours as needed for nausea.   simethicone 40 MG/0.6ML drops Commonly known as:  MYLICON Place 0.6 mLs (40 mg total) into feeding tube 4 (four) times daily as needed for flatulence.   UNABLE TO FIND Med Name: Good Belly probiotic 5oz every morning   valACYclovir 500 MG tablet Commonly known as:  VALTREX PLACE 1 TABLET INTO FEEDING TUBE DAILY What changed:  See the new instructions.            Discharge Care Instructions        Start     Ordered   09/30/16 0000  Increase activity slowly     09/30/16 1224   09/30/16 0000  Diet - low sodium heart healthy     09/30/16 1224   09/30/16 0000  Call MD for:  temperature >100.4     09/30/16 1224   09/30/16 0000  Call MD for:  persistant nausea and vomiting     09/30/16 1224   09/30/16 0000  Call MD for:  severe uncontrolled pain     09/30/16 1224   09/30/16 0000  Call MD for:  redness, tenderness, or signs of infection (pain, swelling, redness, odor or green/yellow discharge around incision site)     09/30/16 1224   09/30/16 0000  Call MD for:  difficulty breathing, headache or visual disturbances     09/30/16 1224   09/30/16 0000  Call MD for:  hives     09/30/16 1224   09/30/16 0000  Call MD for:  persistant dizziness or light-headedness     09/30/16 1224   09/30/16 0000  Call MD for:  extreme  fatigue     09/30/16 1224     Follow-up Information    McCari CarawayMD. Schedule an appointment as soon  as possible for a visit in 3 week(s).   Specialty:  Family Medicine Why:  Hospital follow up Contact information: [place] [address] [phone]          Allergies  Allergen Reactions  . Cefepime Other (See Comments)    Status epilepticus- this reaction is questionable per husband.States that it was determined that pt did not have seizure activity. Status epilepticus  . Tetracyclines & Related Photosensitivity and Other (See Comments)    Blisters  . Doxycycline Monohydrate     Blisters, photosensitivity- family state that the reaction is actually to tetracycline.  . Pollen Extract Other (See Comments)    Running nose  . Tetracycline Other (See Comments) and Photosensitivity    Blisters as a child.    Consultations:  PCCM   PMR   Procedures/Studies: Dg Chest 2 View  Result Date: 09/08/2016 CLINICAL DATA:  Fever. EXAM: CHEST  2 VIEW COMPARISON:  06/18/2016. FINDINGS: Tracheostomy tube noted with its tip over the mid trachea. Mild right base subsegmental atelectasis and or scarring. Bilateral nipple shadows again noted. Surgical clips noted over the left chest. No pleural effusion or pneumothorax. No acute bony abnormality. Thoracic spine scoliosis. Gastrostomy tube noted in stable position. IMPRESSION: 1. Tracheostomy tube noted in stable position. Gastrostomy tube in stable position. 2. Mild right base subsegmental atelectasis and/or scarring again noted. No acute pulmonary disease. Electronically Signed   By: Marcello Moores  Register   On: 09/08/2016 14:29   Ir Replc Gastro/colonic Tube Percut W/fluoro  Result Date: 09/29/2016 INDICATION: Gastrostomy tube malfunction. EXAM: FLUOROSCOPIC GUIDED REPLACEMENT OF GASTROSTOMY TUBE COMPARISON:  None. MEDICATIONS: None. CONTRAST:  A total 20 cc Isovue-300 was administered into the gastric lumen FLUOROSCOPY  TIME:  18 seconds (0.4 mGy) COMPLICATIONS: None immediate. PROCEDURE: The upper abdomen and external portion of the existing gastrostomy tube was prepped and draped in the usual sterile fashion, and a sterile drape was applied covering the operative field. Maximum barrier sterile technique with sterile gowns and gloves were used for the procedure. A timeout was performed prior to the initiation of the procedure. The existing gastrostomy tube was injected with a small amount of contrast confirming appropriate positioning within the gastric lumen. The existing gastrostomy tube was removed intact. Next, initially an 93 and ultimately a 13 [place] gastrostomy balloon retention gastrostomy tube was advanced through the gastrostomy tract. The balloon was inflated and disc was cinched. Contrast was injected and several spot fluoroscopic images were obtained in various obliquities to confirm appropriate intraluminal positioning. A dressing was placed. The patient tolerated the procedure well without immediate postprocedural complication. IMPRESSION: Successful fluoroscopic guided replacement of a new 22-French gastrostomy tube. The new gastrostomy tube is ready for immediate use. Electronically Signed   By: Sandi Mariscal M.D.   On: 09/29/2016 09:56   Dg Chest Port 1 View  Result Date: 09/29/2016 CLINICAL DATA:  Respiratory failure EXAM: PORTABLE CHEST 1 VIEW COMPARISON:  09/26/2016 chest radiograph. FINDINGS: Tracheostomy tube tip overlies the tracheal air column at the level of the thoracic inlet. Gastrostomy tube terminates over the proximal stomach. Stable cardiomediastinal silhouette with normal heart size. No pneumothorax. No pleural effusion. Stable patchy left retrocardiac opacity. Stable scarring versus atelectasis at the left costophrenic angle. No pulmonary edema. IMPRESSION: 1. Well-positioned support structures. 2. Stable patchy left retrocardiac opacity and mild left costophrenic angle scarring versus  atelectasis. Electronically Signed   By: Ilona Sorrel M.D.   On: 09/29/2016 07:52   Dg Chest [facility]  Result Date: 09/26/2016 CLINICAL DATA:  Chronic ventilator dependent respiratory failure. Follow-up basilar atelectasis. EXAM: PORTABLE CHEST 1 VIEW COMPARISON:  09/24/2016, 09/08/2016 and earlier. FINDINGS: Tracheostomy tube in satisfactory position with its tip below the thoracic inlet, unchanged. Suboptimal inspiration. Cardiac silhouette upper normal in size to slightly enlarged for AP portable technique, unchanged. Worsening aeration in the left lower lobe with silhouetting of the left hemidiaphragm. Stable linear atelectasis or scarring at the right lung base dating back to at least January, 2018. No new pulmonary parenchymal abnormalities elsewhere. IMPRESSION: 1.  Support apparatus satisfactory. 2. Worsening aeration in the left lower lobe indicating worsening atelectasis and/or pneumonia. 3. Stable atelectasis or scar at the right lung base. 4. No new abnormalities elsewhere in either lung. Electronically Signed   By: Evangeline Dakin M.D.   On: 09/26/2016 07:19   Dg Chest Port 1 View  Result Date: 09/24/2016 CLINICAL DATA:  Low-grade fever and excess secretions. EXAM: PORTABLE CHEST 1 VIEW COMPARISON:  09/08/2016. FINDINGS: Stable normal sized heart. No significant change in linear density at the right lung base. Interval minimal linear density on the left. Poor inspiration. Diffuse osteopenia. Tracheostomy tube in satisfactory position. IMPRESSION: 1. Poor inspiration with interval minimal linear atelectasis on the left. 2. Stable right basilar linear atelectasis or scarring. Electronically Signed   By: Claudie Revering M.D.   On: 09/24/2016 20:18    Discharge Exam: Vitals:   09/30/16 1501 09/30/16 1622  BP:  119/74  Pulse: 80 85  Resp: 20   Temp:  98.4 F (36.9 C)  SpO2: 100% 99%   Vitals:   09/30/16 1031 09/30/16 1207 09/30/16 1501 09/30/16 1622  BP: 125/84   119/74  Pulse: 80 89  80 85  Resp:  (!) 22 20   Temp:    98.4 F (36.9 C)  TempSrc:    Oral  SpO2:  98% 100% 99%  Weight:        General: NAD, ill looking, cachectic  Cardiovascular: RRR, S1/S2 +, no rubs, no gallops Respiratory: Decrease breath sound b/l, mild rhonchi on LLL  Abdominal: Soft, NT, ND, bowel sounds + PEG tube in place and working properly  Extremities: no edema, Decrease ROM, contracted  Neuro: Does not follow commands, Non verbal.   The results of significant diagnostics from this hospitalization (including imaging, microbiology, ancillary and laboratory) are listed below for reference.     Microbiology: Recent Results (from the past 240 hour(s))  Culture, respiratory (NON-Expectorated)     Status: None   Collection Time: 09/24/16  5:02 PM  Result Value Ref Range Status   Specimen Description TRACHEAL ASPIRATE  Final   Special Requests NONE  Final   Gram Stain   Final    ABUNDANT WBC PRESENT,BOTH PMN AND MONONUCLEAR FEW GRAM POSITIVE COCCI IN PAIRS    Culture   Final    MODERATE METHICILLIN RESISTANT STAPHYLOCOCCUS AUREUS   Report Status 09/26/2016 FINAL  Final   Organism ID, Bacteria METHICILLIN RESISTANT STAPHYLOCOCCUS AUREUS  Final      Susceptibility   Methicillin resistant staphylococcus aureus - MIC*    CIPROFLOXACIN >=8 RESISTANT Resistant     ERYTHROMYCIN >=8 RESISTANT Resistant     GENTAMICIN <=0.5 SENSITIVE Sensitive     OXACILLIN >=4 RESISTANT Resistant     TETRACYCLINE 2 SENSITIVE Sensitive     VANCOMYCIN <=0.5 SENSITIVE Sensitive     TRIMETH/SULFA <=10 SENSITIVE Sensitive     CLINDAMYCIN >=8 RESISTANT Resistant     RIFAMPIN <=0.5 SENSITIVE Sensitive  Inducible Clindamycin NEGATIVE Sensitive     * MODERATE METHICILLIN RESISTANT STAPHYLOCOCCUS AUREUS  Urine culture     Status: None   Collection Time: 09/24/16  6:15 PM  Result Value Ref Range Status   Specimen Description URINE, CATHETERIZED  Final   Special Requests NONE  Final   Culture NO GROWTH  Final    Report Status 09/26/2016 FINAL  Final  Culture, blood (Routine x 2)     Status: None   Collection Time: 09/24/16  6:47 PM  Result Value Ref Range Status   Specimen Description BLOOD RIGHT WRIST  Final   Special Requests   Final    BOTTLES DRAWN AEROBIC AND ANAEROBIC Blood Culture adequate volume   Culture NO GROWTH 5 DAYS  Final   Report Status 09/29/2016 FINAL  Final  Culture, blood (Routine x 2)     Status: None   Collection Time: 09/24/16  6:54 PM  Result Value Ref Range Status   Specimen Description BLOOD LEFT HAND  Final   Special Requests IN PEDIATRIC BOTTLE Blood Culture adequate volume  Final   Culture NO GROWTH 5 DAYS  Final   Report Status 09/29/2016 FINAL  Final  MRSA PCR Screening     Status: Abnormal   Collection Time: 09/24/16 10:11 PM  Result Value Ref Range Status   MRSA by PCR POSITIVE (A) NEGATIVE Final    Comment:        The GeneXpert MRSA Assay (FDA approved for NASAL specimens only), is one component of a comprehensive MRSA colonization surveillance program. It is not intended to diagnose MRSA infection nor to guide or monitor treatment for MRSA infections. RESULT CALLED TO, READ BACK BY AND VERIFIED WITH: N.WILEY, RN 09/25/16 0354 L.CHAMPION      Labs: BNP (last 3 results) No results for input(s): BNP in the last 8760 hours. Basic Metabolic Panel:  Recent Labs Lab 09/25/16 0559 09/27/16 0325 09/28/16 0925 09/29/16 0533 09/30/16 0955  NA 135 134* 129* 127* 131*  K 3.4* 2.7* 3.3* 3.0* 3.1*  CL 98* 96* 91* 89* 92*  CO2 _0 GLUCOSE 137* 130* 123* 132* 138*  BUN _1 CREATININE 0.35* 0.33* <0.30* <0.30* 0.32*  CALCIUM 8.9 8.6* 8.7* 8.6* 9.0  MG  --   --   --  1.9  --   PHOS  --   --   --  3.3  --    Liver Function Tests:  Recent Labs Lab 09/24/16 1640  AST 56*  ALT 69*  ALKPHOS 180*  BILITOT 1.4*  PROT 7.0  ALBUMIN 3.2*   No results for input(s): LIPASE, AMYLASE in the last 168 hours. No results for input(s):  AMMONIA in the last 168 hours. CBC:  Recent Labs Lab 09/24/16 1640 09/25/16 0559 09/29/16 0533  WBC 9.9 8.4 7.4  NEUTROABS 7.4  --   --   HGB 13.3 13.2 12.9  HCT 39.3 39.4 38.3  MCV 85.8 85.5 84.0  PLT 325 318 344   Cardiac Enzymes: No results for input(s): CKTOTAL, CKMB, CKMBINDEX, TROPONINI in the last 168 hours. BNP: Invalid input(s): POCBNP CBG:  Recent Labs Lab 09/27/16 0814  GLUCAP 139*   D-Dimer No results for input(s): DDIMER in the last 72 hours. Hgb A1c No results for input(s): HGBA1C in the last 72 hours. Lipid Profile No results for input(s): CHOL, HDL, LDLCALC, TRIG, CHOLHDL, LDLDIRECT in the last 72 hours. Thyroid function studies No results for input(s): TSH,  T4TOTAL, T3FREE, THYROIDAB in the last 72 hours.  Invalid input(s): FREET3 Anemia work up No results for input(s): VITAMINB12, FOLATE, FERRITIN, TIBC, IRON, RETICCTPCT in the last 72 hours. Urinalysis    Component Value Date/Time   COLORURINE YELLOW 09/24/2016 1815   APPEARANCEUR HAZY (A) 09/24/2016 1815   LABSPEC 1.025 09/24/2016 1815   PHURINE 6.0 09/24/2016 1815   GLUCOSEU NEGATIVE 09/24/2016 1815   HGBUR NEGATIVE 09/24/2016 1815   BILIRUBINUR NEGATIVE 09/24/2016 1815   KETONESUR NEGATIVE 09/24/2016 1815   PROTEINUR NEGATIVE 09/24/2016 1815   NITRITE NEGATIVE 09/24/2016 1815   LEUKOCYTESUR NEGATIVE 09/24/2016 1815   Sepsis Labs Invalid input(s): PROCALCITONIN,  WBC,  LACTICIDVEN Microbiology Recent Results (from the past 240 hour(s))  Culture, respiratory (NON-Expectorated)     Status: None   Collection Time: 09/24/16  5:02 PM  Result Value Ref Range Status   Specimen Description TRACHEAL ASPIRATE  Final   Special Requests NONE  Final   Gram Stain   Final    ABUNDANT WBC PRESENT,BOTH PMN AND MONONUCLEAR FEW GRAM POSITIVE COCCI IN PAIRS    Culture   Final    MODERATE METHICILLIN RESISTANT STAPHYLOCOCCUS AUREUS   Report Status 09/26/2016 FINAL  Final   Organism ID, Bacteria  METHICILLIN RESISTANT STAPHYLOCOCCUS AUREUS  Final      Susceptibility   Methicillin resistant staphylococcus aureus - MIC*    CIPROFLOXACIN >=8 RESISTANT Resistant     ERYTHROMYCIN >=8 RESISTANT Resistant     GENTAMICIN <=0.5 SENSITIVE Sensitive     OXACILLIN >=4 RESISTANT Resistant     TETRACYCLINE 2 SENSITIVE Sensitive     VANCOMYCIN <=0.5 SENSITIVE Sensitive     TRIMETH/SULFA <=10 SENSITIVE Sensitive     CLINDAMYCIN >=8 RESISTANT Resistant     RIFAMPIN <=0.5 SENSITIVE Sensitive     Inducible Clindamycin NEGATIVE Sensitive     * MODERATE METHICILLIN RESISTANT STAPHYLOCOCCUS AUREUS  Urine culture     Status: None   Collection Time: 09/24/16  6:15 PM  Result Value Ref Range Status   Specimen Description URINE, CATHETERIZED  Final   Special Requests NONE  Final   Culture NO GROWTH  Final   Report Status 09/26/2016 FINAL  Final  Culture, blood (Routine x 2)     Status: None   Collection Time: 09/24/16  6:47 PM  Result Value Ref Range Status   Specimen Description BLOOD RIGHT WRIST  Final   Special Requests   Final    BOTTLES DRAWN AEROBIC AND ANAEROBIC Blood Culture adequate volume   Culture NO GROWTH 5 DAYS  Final   Report Status 09/29/2016 FINAL  Final  Culture, blood (Routine x 2)     Status: None   Collection Time: 09/24/16  6:54 PM  Result Value Ref Range Status   Specimen Description BLOOD LEFT HAND  Final   Special Requests IN PEDIATRIC BOTTLE Blood Culture adequate volume  Final   Culture NO GROWTH 5 DAYS  Final   Report Status 09/29/2016 FINAL  Final  MRSA PCR Screening     Status: Abnormal   Collection Time: 09/24/16 10:11 PM  Result Value Ref Range Status   MRSA by PCR POSITIVE (A) NEGATIVE Final    Comment:        The GeneXpert MRSA Assay (FDA approved for NASAL specimens only), is one component of a comprehensive MRSA colonization surveillance program. It is not intended to diagnose MRSA infection nor to guide or monitor treatment for MRSA  infections. RESULT CALLED TO, READ BACK BY AND VERIFIED  WITH: N.WILEY, RN 09/25/16 0354 L.CHAMPION      Time coordinating discharge: 35 minutes  SIGNED:  Chipper Oman, MD  [facility] 09/30/2016, 7:07 PM  Pager please text page via  [email or web address] Password [email or web address]

## 2016-09-30 NOTE — PMR Pre-admission (Signed)
PMR Admission Coordinator Pre-Admission Assessment  Patient: Judy Spears is an 66 y.o., female MRN: [id number] DOB: 1950/04/28 Height:   Weight: 44.2 kg (97 lb 6.4 oz)              Insurance Information HMO:     PPO: YES     PCP:      IPA:      80/20:      OTHER: Medicare advantage plan PRIMARY: [employer]      Policy#: [id number]      Subscriber: pt CM Name: Orvan July      Phone#: [phone]     Fax#: [phone] Pre-Cert#: [id number] approved for 7 days with f/u with Vevelyn Royals phone [phone] fax [phone]      Employer: disabled Benefits:  Phone #: online     Name: 09/29/16 Eff. Date: 08/25/16     Deduct: none      Out of Pocket Max: $4000      Life Max: none CIR: $160 co pay per day days 1-10 then insurance covers 100%      SNF: no co pay days 1-20; $50 co pay per day days 21-100 Outpatient: $20 co pay per visit     Co-Pay: visits per medical neccesity Home Health: 100%      Co-Pay: visits per medical neccesity DME: 80%     Co-Pay: 20% Providers: in network  SECONDARY: none        Medicaid Application Date:       Case Manager:  Disability Application Date:       Case Worker:   Emergency Facilities manager Information    Name Relation Home Work [place]    Gypsy Balsam Daughter [phone]     Irving Shows [phone]       Current Medical History  Patient Admitting Diagnosis: debility  History of Present Illness: HPI:  Judy Spears a 66 y.o.femalewith 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  She was admitted on 09/24/16 with lethargy. SOB and low grade fevers due to MRSA tracheobronchitis v/s PNA. She was started on IV vancomycin as well as chest PT. Respiratory status improving with treatment and foley placed  due to urinary retention. PEG tube evaluated by IVR and was exchanged out on 9/5.  Secretions are improving and PCCM recommends chest PT to help with mobilization of secretions.  Progressive hyponatremia noted and water flushes Foley discontinued yesterday and has required I/O caths for volumes 400- 600 cc. Patient has had significant decline in mobility and ability to carryout ADL tasks.  Past Medical History  Past Medical History:  Diagnosis Date  . Arthritis    lt great toe  . Complication of anesthesia    pt has had headaches post op-did advise to hydra well preop  . Hair loss    right-sided  . History of exercise stress test    a. ETT (12/15) with 12:00 exercise, no ST changes, occasional PACs.   . Hyperlipidemia   . Insomnia   . Migraine headache   . Movement disorder    resltess in left legs  . Postmenopausal   . 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 Premature atrial contractions    a. Holter (12/15) with 8% PACs, no atrial runs or atrial fibrillation.     Family History  family history includes Colon  cancer in her father; Heart attack (age of onset: 43) in her maternal grandfather; Hypertension in her mother; Prostate cancer in her father; Stroke in her mother; Stroke (age of onset: 24) in her maternal uncle.  Prior Rehab/Hospitalizations:  Has the patient had major surgery during 100 days prior to admission? Yes   Patient initially dx  November 2016 with encephalitis in [place], [place]. Transferred to [facility] acute hospital. Admitted to [facility] in [place]. Was 5 months inpt and 2 months [place]. Discharged home with spouse and caregivers in  07/2015.  CIR 01/2016 as admission from [facility] in [facility]  01/2016. Has been receiving Out pt rehab at [facility] twice weekly PT and OT.  Current Medications   Current Facility-Administered Medications:  .  0.9 %  sodium chloride infusion, , Intravenous, Continuous, Patrecia Pour, Christean Grief, MD, Last Rate: 75 mL/hr at 09/30/16  0423 .  acetaminophen (TYLENOL) tablet 650 mg, 650 mg, Per Tube, Q6H PRN, Rigoberto Noel, MD, 650 mg at 09/25/16 0707 .  amantadine (SYMMETREL) 50 MG/5ML solution 100 mg, 100 mg, Per Tube, BID, Erick Colace, NP, 100 mg at 09/30/16 1035 .  amLODipine (NORVASC) 1 mg/mL oral suspension 7.5 mg, 7.5 mg, Per Tube, Daily, Rush Farmer, MD, 7.5 mg at 09/30/16 1034 .  baclofen (LIORESAL) 10 mg/mL oral suspension 10 mg, 10 mg, Per Tube, QID, Erick Colace, NP, 10 mg at 09/30/16 1035 .  bisacodyl (DULCOLAX) suppository 10 mg, 10 mg, Rectal, Daily PRN, Omar Person, NP .  diazepam (VALIUM) tablet 2 mg, 2 mg, Per Tube, QHS, Simonne Maffucci B, MD, 2 mg at 09/29/16 2316 .  feeding supplement (OSMOLITE 1.5 CAL) liquid 1,000 mL, 1,000 mL, Per Tube, Q24H, Omar Person, NP, Last Rate: 40 mL/hr at 09/29/16 2320, 1,000 mL at 09/29/16 2320 .  free water 50 mL, 50 mL, Per Tube, Q8H, Patrecia Pour, Edwin, MD, 50 mL at 09/30/16 0600 .  guaiFENesin (ROBITUSSIN) 100 MG/5ML solution 200 mg, 10 mL, Oral, Q6H, McQuaid, Douglas B, MD, 200 mg at 09/30/16 1035 .  heparin injection 5,000 Units, 5,000 Units, Subcutaneous, Q8H, Eubanks, Katalina M, NP, 5,000 Units at 09/30/16 0608 .  lidocaine (XYLOCAINE) 2 % viscous mouth solution, , Mouth/Throat, PRN, Sandi Mariscal, MD, 15 mL at 09/29/16 0905 .  saccharomyces boulardii (FLORASTOR) capsule 250 mg, 250 mg, Oral, Daily, Hayden Pedro M, NP, 250 mg at 09/30/16 1033 .  vancomycin (VANCOCIN) IVPB 750 mg/150 ml premix, 750 mg, Intravenous, Q8H, Karren Cobble, RPH, Stopped at 09/30/16 5681  Patients Current Diet: PEG feeds usually at night began at 5 pm until morning. NPO. Trach #4 cuff less at home capped until 3 days prior to admission and then uncapped due to secretions.. Now 28 % trach collar. Has not used PMSV this admission.  Precautions / Restrictions Precautions Precautions: Fall Precaution Comments: Watch O2 sats  Restrictions Weight Bearing  Restrictions: No   Has the patient had 2 or more falls or a fall with injury in the past year?No  Prior Activity Level Was using walker to ambulate until end of June. Lumpectomy 08/03/16 and with oral chemo treatment family found pt with a decline in function. Pt had just been at beach for a week with family, and upon return pt febrile, constipated and congested. Pt typically dependent with all adls. uses lift chair to come down to her manual wheelchair and is up in chair most of day. [place] caregiver is with pt 8-4  30 pm daily. Other caregivers are through [place] and provide 24/7 assist. Pt uses handicapped accessible van and does outpt therapy twice weekly. Trach capped at home until 3 days pta when pt had low grade fever and increased secretions.  Home Assistive Devices / Equipment Home Assistive Devices/Equipment: Wheelchair, Vent/Trach supplies, Hospital bed, Shower chair with back, Raised toilet seat with rails, Grab bars around toilet, Bedside commode/3-in-1 Home Equipment: Hospital bed, Wheelchair - manual, Tub bench, Hand held shower head, Bedside commode, Walker - 2 wheels (shower W/C, Stedy)  Prior Device Use: Indicate devices/aids used by the patient prior to current illness, exacerbation or injury? Manual wheelchair, walker and Stedy for transfers as needed  Prior Functional Level Prior Function Level of Independence: Needs assistance Gait / Transfers Assistance Needed: Had been using stedy the last couple of months to get to [facility] as pt has declined.  ADL's / Homemaking Assistance Needed: max assist with bathing, dressing; but up until 2 months ago she was washing her whole UB and thighs with setup/S Communication / Swallowing Assistance Needed: pt did shake her head yes and mouthed "hello" to me  Self Care: Did the patient need help bathing, dressing, using the toilet or eating?  Dependent  Indoor Mobility: Did the patient need assistance with walking from room to room  (with or without device)? Dependent  Stairs: Did the patient need assistance with internal or external stairs (with or without device)? Dependent  Functional Cognition: Did the patient need help planning regular tasks such as shopping or remembering to take medications? Dependent  Current Functional Level Cognition  Overall Cognitive Status: Within Functional Limits for tasks assessed Orientation Level: Intubated/Tracheostomy - Unable to assess General Comments: Pt with no verbal responses noted but maintain eye contact with therapist 75% of the time.     Extremity Assessment (includes Sensation/Coordination)  Upper Extremity Assessment: LUE deficits/detail, RUE deficits/detail RUE Deficits / Details: continously fidgets/moves digits; guarding movements with AAROM/PROM (do not think it is in response to pain--but rather movements she cannot control); husband reports she has a hand splint at night  RUE Coordination: decreased fine motor, decreased gross motor LUE Deficits / Details: continously fidgets/moves digits; guarding movements with AAROM/PROM (may due to pain for elbow ROM due to contractures, but other guarding not necessarily due pain; husband reports she has a JAS splint that she should wear 3x/day for 20 minutes and a resting elbow splint to wear at night LUE Coordination: decreased fine motor, decreased gross motor  Lower Extremity Assessment: LLE deficits/detail, Generalized weakness LLE Deficits / Details: Very guarded vs. contracture in LLE; ER with knee flexion while laying in bed.      ADLs  Overall ADL's : Needs assistance/impaired Eating/Feeding: NPO General ADL Comments: Pt's spouse and daughter present during session.  Applied elbow lock out splint on the LUE with lock adjusted to 65 degrees extension.  Applying small stretch to left elbow flexors.  Educated family and nursing for pt to wear this splint all the time for now and we can work on making small adjustments to  increase flexion while on inpatient stay.  Family is familiar with donning and doffing as pt was wearing this splint at night prior to this admission.  Did not apply Jas splint this session.  Feel pt is OK with small increments of the fixed elbow splint at this time and that is will be more tolerable to wear for longer durations.  Family and nursing educated on skin inspection at pressure  areas as well.  Took pt's resting hand splint that she was wearing on the right hand at night and added support as it was cracking in the palm area.  Also applied new velcro to two locations.  Was not able to apply splint at this time secondary to IV site being in the posterior aspect of the forearm.  Discussed with nursing possibly moving IV sit so pt can wear splint.  Pt still tolerating palm guards bilaterally at this time.      Mobility  Overal bed mobility: Needs Assistance Bed Mobility: Rolling, Supine to Sit Rolling: Total assist (with increased time and cues for sequencing) Supine to sit: Total assist, +2 for physical assistance Sit to supine: Total assist, +2 for physical assistance General bed mobility comments: Total assist +2 for bed mobility. Very guarded with movement.    Transfers  General transfer comment: Attempted transfer via steady. Caregiver reports pt was using steady at home before decline. At this time pt unable to reach to steady bar to pull up. Offered to lift pt via hoyer lift, however pt declined and was returned to bed.    Ambulation / Gait / Stairs / Office manager / Balance Dynamic Sitting Balance Sitting balance - Comments: Pt sat EOB in prep for transfer. Total assist provided in sitting to maintain balance and upright posture. Balance Overall balance assessment: Needs assistance Sitting-balance support: No upper extremity supported, Feet supported Sitting balance-Leahy Scale: Zero Sitting balance - Comments: Pt sat EOB in prep for transfer. Total assist  provided in sitting to maintain balance and upright posture.    Special needs/care consideration BiPAP/CPAP  N/a CPM  N/a Continuous Drip IV yes Dialysis  N/a Life Vest  N/a Chest PT machine prn Oxygen 28 % trach collar Special Bed  N/a Trach Size # 4 cuff less trach. PMSV not used this admission. Infrequent suctioning needed Wound Vac n/a Skin  Buttocks with 1 cm length and 0.5 cm width stage 1 noted 09/27/2016. Labia with moisture associated skin changes. Right hip with foam dressing noted . Bowel mgmt: incontinent LBM 09/30/16. Constipation issues week pta and pt had an enema to resolve constipation Bladder mgmt: foley removed 09/29/16 ; I and O cath 09/30/16 Diabetic mgmt n/a Peg replaced 09/29/17 to 22 french which is leaking. No clamp which family requested. RN on acute hospital to contact IR 09/30/16 to discuss leaking issues. Contact precautions    Previous Home Environment Living Arrangements: Spouse/significant other  Lives With: Spouse Available Help at Discharge: Family, Personal care attendant, Available 24 hours/day Type of Home: House Home Layout: Two level, Bed/bath upstairs Alternate Level Stairs-Number of Steps: chair lift Home Access: Ramped entrance Bathroom Shower/Tub: Multimedia programmer: Handicapped height Bathroom Accessibility: Yes How Accessible: Accessible via wheelchair, Accessible via [place]: Yes Type of [place]: Homehealth aide, [place] (if known): neuro rehab outpatient center for OT, PT/Nurse Care of [employer] (Nurse care Man for [employer]) Additional Comments: Pt's husband provided all info about home   Discharge Living Setting Plans for Discharge Living Setting: Patient's home, Lives with (comment) (spouse) Type of Home at Discharge: House Discharge Home Layout: Two level, Bed/bath upstairs Alternate Level Stairs-Rails:  (chair lift) Discharge Home Access: Ramped  entrance Discharge Bathroom Shower/Tub: Walk-in shower Discharge Bathroom Toilet: Handicapped height Discharge Bathroom Accessibility: Yes How Accessible: Accessible via wheelchair, Accessible via walker Does the patient have any problems obtaining  your medications?: No  Social/Family/Support Systems Patient Roles: Spouse, Parent Contact Information: [name], spouse Anticipated Caregiver: spouse and caregiver, [name] Anticipated Caregiver's Contact Information: see above Ability/Limitations of Caregiver: none; spouse works Building control surveyor Availability: 24/7 Discharge Plan Discussed with Primary Caregiver: Yes Is Caregiver In Agreement with Plan?: Yes Does Caregiver/Family have Issues with Lodging/Transportation while Pt is in Rehab?: No  Goals/Additional Needs Patient/Family Goal for Rehab: Mod to max assist at wheelchair level Expected length of stay: ELOS 10- 14 days Pt/Family Agrees to Admission and willing to participate: Yes Program Orientation Provided & Reviewed with Pt/Caregiver Including Roles  & Responsibilities: Yes  Decrease burden of Care through IP rehab admission: n/a  Possible need for SNF placement upon discharge: not anticipated  Patient Condition: This patient's medical and functional status has changed since the consult dated: 09/28/2016 in which the Rehabilitation Physician determined and documented that the patient's condition is appropriate for intensive rehabilitative care in an inpatient rehabilitation facility. See "History of Present Illness" (above) for medical update. Functional changes are: total assist. Patient's medical and functional status update has been discussed with the Rehabilitation physician and patient remains appropriate for inpatient rehabilitation. Will admit to inpatient rehab today.  Preadmission Screen Completed By:  Cleatrice Burke, 09/30/2016 11:57 AM ______________________________________________________________________   Discussed status  with Dr. Letta Pate on 09/30/2016 at  1157 and received telephone approval for admission today.  Admission Coordinator:  Cleatrice Burke, time 3888 Date 09/30/2016

## 2016-09-30 NOTE — H&P (Signed)
Physical Medicine and Rehabilitation Admission H&P        Chief Complaint  Patient presents with  .        HPI:  Judy Spears is a 66 y.o. 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 History taken from patient and daughters. Patient known to CIR from prior stay and was involved in outpatient therapy.  She was admitted on 09/24/16 with lethargy. SOB and low grade fevers due to MRSA tracheobronchitis v/s PNA. She was started on IV vancomycin as well as chest PT.  Respiratory status improving with treatment and foley placed due to urinary retention. PEG tube evaluated by IVR and was exchanged out on 9/5.  Secretions are improving and PCCM recommends chest PT to help with mobilization of secretions.  Progressive hyponateremia noted and water flushes Foley discontinued yesterday and has required I/O caths for volumes 400- 600 cc. Patient has had significant decline in mobility and ability to carryout ADL tasks. CIR recommended by rehab team.   Review of Systems  Unable to perform ROS: Patient nonverbal            Past Medical History:  Diagnosis Date  . Arthritis      lt great toe  . Complication of anesthesia      pt has had headaches post op-did advise to hydra well preop  . Hair loss      right-sided  . History of exercise stress test      a. ETT (12/15) with 12:00 exercise, no ST changes, occasional PACs.   . Hyperlipidemia    . Insomnia    . Migraine headache    . Movement disorder      resltess in left legs  . Postmenopausal    . [place] encephalitis    . Premature atrial contractions      a. Holter (12/15) with 8% PACs, no atrial runs or atrial fibrillation.            Past Surgical History:  Procedure Laterality Date  . BREAST BIOPSY   01/01/2011    Procedure: BREAST BIOPSY WITH NEEDLE  LOCALIZATION;  Surgeon: Rolm Bookbinder, MD;  Location: [place];  Service: General;  Laterality: Left;  left breast wire localization biopsy  . BREAST BIOPSY Right 07/27/2012  . BREAST BIOPSY Right 07/07/2016  . BREAST BIOPSY Left 06/30/2016  . cataracts right eye      . CESAREAN SECTION   1986  . HERNIA REPAIR   2000    [facility]  . [place] TUBE PERCUT W/FLUORO   09/29/2016  . opirectinal membrane peel      . PEG PLACEMENT      . RHINOPLASTY   1983  . TRACHEOSTOMY               Family History  Problem Relation Age of Onset  . Stroke Mother          paralyzed on right side/aphasia  . Hypertension Mother          had stroke after stopping BP meds  . Colon cancer Father    . Prostate cancer Father    . Heart attack Maternal Grandfather 56  . Stroke Maternal Uncle 6      Social History:  Married. Has around the clock caregivers. Per reports that she has never smoked. She has never used smokeless  tobacco. Per reports that she drinks alcohol on rare occasions. Per reports that she does not use drugs.           Allergies  Allergen Reactions  . Cefepime Other (See Comments)      Status epilepticus- this reaction is questionable per husband.States that it was determined that pt did not have seizure activity. Status epilepticus  . Tetracyclines & Related Photosensitivity and Other (See Comments)      Blisters  . Doxycycline Monohydrate        Blisters, photosensitivity- family state that the reaction is actually to tetracycline.  . Pollen Extract Other (See Comments)      Running nose  . Tetracycline Other (See Comments) and Photosensitivity      Blisters as a child.            Medications Prior to Admission  Medication Sig Dispense Refill  . amantadine (SYMMETREL) 50 MG/5ML solution Place 10 mLs (100 mg total) into feeding tube 2 (two) times daily. In am and at noon (Patient taking differently: Place 150 mg into feeding tube 2 (two) times daily.  In am and at noon ) 473 mL 0  . amLODipine (NORVASC) 5 MG tablet PLACE ONE TABLET INTO FEEDING TUBE DAILY 30 tablet 1  . atorvastatin (LIPITOR) 20 MG tablet Place 20 mg into feeding tube at bedtime.       . baclofen (LIORESAL) 10 MG tablet Place 1 tablet (10 mg total) into feeding tube every 6 (six) hours. (Patient taking differently: Place 10 mg into feeding tube every 6 (six) hours. 8am 12noon 8 pm) 120 each 5  . chlorhexidine gluconate, MEDLINE KIT, (PERIDEX) 0.12 % solution 15 mLs by Mouth Rinse route 2 (two) times daily. (Patient taking differently: 5 mLs by Mouth Rinse route 2 (two) times daily. ) 120 mL 0  . Coenzyme Q10 (COQ10 PO) Take 10 mLs by mouth at bedtime.      [name] CRANBERRY JUICE EXTRACT PO Take by mouth daily. In the morning      . diazepam (VALIUM) 2 MG tablet PLACE 1 TABLET INTO FEEDING TUBE AT BEDTIME (Patient taking differently: PLACE 2 mg TABLET INTO FEEDING TUBE AT BEDTIME) 30 tablet 2  . docusate (COLACE) 60 MG/15ML syrup Place 100 mg into feeding tube 2 (two) times daily. Takes twice a day but can take 1 addt'l dose as needed for constipation      . famotidine (PEPCID) 20 MG tablet Take 1 tablet (20 mg total) by mouth 2 (two) times daily. 60 tablet 3  . fluticasone (FLONASE) 50 MCG/ACT nasal spray Place 1 spray into both nostrils 2 (two) times daily.      [name] gabapentin (NEURONTIN) 100 MG capsule Take 1 capsule (100 mg total) by mouth 3 (three) times daily. (Patient taking differently: Take 100 mg by mouth 3 (three) times daily. Feeding tube) 90 capsule 3  . guaiFENesin (ROBITUSSIN) 100 MG/5ML SOLN Place 10 mLs (200 mg total) into feeding tube 4 (four) times daily. (Patient taking differently: Place 10 mLs into feeding tube as needed for to loosen phlegm. ) 1200 mL 0  . loratadine (CLARITIN) 10 MG tablet Place 1 tablet (10 mg total) into feeding tube daily as needed for allergies. (Patient taking differently: Place 10 mg into feeding tube every morning. daily)      . Melatonin 5 MG  CAPS Place 5 mg into feeding tube every evening.       . Multiple Vitamin (MULTIVITAMIN) LIQD Place 15 mLs into  feeding tube daily. 1 Bottle 3  . nystatin (MYCOSTATIN/NYSTOP) powder Apply 1 g topically 2 (two) times daily as needed. (Patient taking differently: Apply 1 g topically 2 (two) times daily as needed (irritation). ) 45 g 0  . potassium chloride 20 MEQ/15ML (10%) SOLN Place 15 mLs (20 mEq total) into feeding tube daily. 900 mL 5  . Probiotic Product (PROBIOTIC ADVANCED) CAPS Take 1 capsule by mouth daily.      . prochlorperazine (COMPAZINE) 5 MG tablet Place 1-2 tablets (5-10 mg total) into feeding tube every 6 (six) hours as needed for nausea. 30 tablet 0  . simethicone (MYLICON) 40 VC/9.4WH drops Place 0.6 mLs (40 mg total) into feeding tube 4 (four) times daily as needed for flatulence. 45 mL 0  . UNABLE TO FIND Med Name: Good Belly probiotic 5oz every morning      . valACYclovir (VALTREX) 500 MG tablet PLACE 1 TABLET INTO FEEDING TUBE DAILY (Patient taking differently: PLACE 500 mg TABLET INTO FEEDING TUBE DAILY) 30 tablet 1  . Water For Irrigation, Sterile (FREE WATER) SOLN Place 150 mLs into feeding tube See admin instructions. Takes 185m 5 times daily, if over 2065mhold water and recheck within an hour      . Hydrocortisone (GERHARDT'S BUTT CREAM) CREA Apply 1 application topically 4 (four) times daily. (Patient taking differently: Apply 1 application topically as needed for irritation. Rear end) 1 each 0      Drug Regimen Review  Drug regimen was reviewed and remains appropriate with no significant issues identified   Home: Home Living Family/patient expects to be discharged to:: Inpatient rehab Living Arrangements: Spouse/significant other Available Help at Discharge: Family, Personal care attendant, Available 24 hours/day Type of Home: House Home Access: Ramped entrance Home Layout: Two level, Bed/bath upstairs Alternate Level Stairs-Number of Steps: chair lift Bathroom  Shower/Tub: WaMultimedia programmerHandicapped height Bathroom Accessibility: Yes Home Equipment: Hospital bed, Wheelchair - manual, Tub bench, Hand held shower head, Bedside commode, Walker - 2 wheels (shower W/C, Stedy) Additional Comments: Pt's husband provided all info about home   Lives With: Spouse   Functional History: Prior Function Level of Independence: Needs assistance Gait / Transfers Assistance Needed: Had been using stedy the last couple of months [facility] has declined.  ADL's / Homemaking Assistance Needed: max assist with bathing, dressing; but up until 2 months ago she was washing her whole UB and thighs with setup/S Communication / Swallowing Assistance Needed: pt did shake her head yes and mouthed "hello" to me   Functional Status:  Mobility: Bed Mobility Overal bed mobility: Needs Assistance Bed Mobility: Rolling, Supine to Sit Rolling: Total assist (with increased time and cues for sequencing) Supine to sit: Total assist, +2 for physical assistance Sit to supine: Total assist, +2 for physical assistance General bed mobility comments: Total assist +2 for bed mobility. Very guarded with movement. Transfers General transfer comment: Attempted transfer via steady. Caregiver reports pt was using steady at home before decline. At this time pt unable to reach to steady bar to pull up. Offered to lift pt via hoyer lift, however pt declined and was returned to bed.   ADL: ADL Overall ADL's : Needs assistance/impaired Eating/Feeding: NPO General ADL Comments: Pt's spouse and daughter present during session.  Applied elbow lock out splint on the LUE with lock adjusted to 65 degrees extension.  Applying small stretch to left elbow flexors.  Educated family and nursing for pt to wear this splint  all the time for now and we can work on making small adjustments to increase flexion while on inpatient stay.  Family is familiar with donning and doffing as pt was  wearing this splint at night prior to this admission.  Did not apply Jas splint this session.  Feel pt is OK with small increments of the fixed elbow splint at this time and that is will be more tolerable to wear for longer durations.  Family and nursing educated on skin inspection at pressure areas as well.  Took pt's resting hand splint that she was wearing on the right hand at night and added support as it was cracking in the palm area.  Also applied new velcro to two locations.  Was not able to apply splint at this time secondary to IV site being in the posterior aspect of the forearm.  Discussed with nursing possibly moving IV sit so pt can wear splint.  Pt still tolerating palm guards bilaterally at this time.     Cognition: Cognition Overall Cognitive Status: Within Functional Limits for tasks assessed Orientation Level: Intubated/Tracheostomy - Unable to assess Cognition Arousal/Alertness: Awake/alert Behavior During Therapy: Flat affect Overall Cognitive Status: Within Functional Limits for tasks assessed General Comments: Pt with no verbal responses noted but maintain eye contact with therapist 75% of the time.      Blood pressure 125/84, pulse 80, temperature 98.3 F (36.8 C), temperature source Oral, resp. rate 20, weight 44.2 kg (97 lb 6.4 oz), last menstrual period 12/01/2010, SpO2 100 %. Physical Exam  Nursing note and vitals reviewed. Constitutional: She appears listless. She appears cachectic. No distress.  HENT:  Head: Normocephalic and atraumatic.  Mouth/Throat: Oropharynx is clear and moist.  Invisiline in place.   Eyes: Pupils are equal, round, and reactive to light. Conjunctivae are normal.  Neck:  Decreased ROM.  Cardiovascular: Normal rate and regular rhythm.   Respiratory: Effort normal. No stridor. No respiratory distress. She has no wheezes. She has rhonchi in the left lower field.  Pulse with transient drops to  70's--patient asymptomatic and no changes in HR on  monitor.   GI: Soft. Bowel sounds are normal. She exhibits no distension. There is no tenderness.  Genitourinary:  Genitourinary Comments: Left labia edematous--non tender to touch but superficial abrasion noted.  Perineum with erythema--question due to yeast.   Musculoskeletal: She exhibits no edema or tenderness.  LLE held in flexed position due to tone. RLE extended due to extensor tone. Able to range RUE. LUE with Bledsoe brace.   Neurological: She appears listless.  Does not make eye contact without max cues. Non verbal. Does not interact or follow commands. Palm guards in place. Right hand with constant reflexive movements.   Skin: Skin is warm and dry. She is not diaphoretic.   With max cues will Flex R elbow , ext fingers 3-, R HF 2- , R quad NT due to increased extensor tone, 0/5 LLE with increased knee flexor tone, 2- Left elbow flexion finger flex/ext   Lab Results Last 48 Hours        Results for orders placed or performed during the hospital encounter of 09/24/16 (from the past 48 hour(s))  CBC     Status: None    Collection Time: 09/29/16  5:33 AM  Result Value Ref Range    WBC 7.4 4.0 - 10.5 K/uL    RBC 4.56 3.87 - 5.11 MIL/uL    Hemoglobin 12.9 12.0 - 15.0 g/dL    HCT 38.3  36.0 - 46.0 %    MCV 84.0 78.0 - 100.0 fL    MCH 28.3 26.0 - 34.0 pg    MCHC 33.7 30.0 - 36.0 g/dL    RDW 12.8 11.5 - 15.5 %    Platelets 344 150 - 400 K/uL  Basic metabolic panel     Status: Abnormal    Collection Time: 09/29/16  5:33 AM  Result Value Ref Range    Sodium 127 (L) 135 - 145 mmol/L    Potassium 3.0 (L) 3.5 - 5.1 mmol/L    Chloride 89 (L) 101 - 111 mmol/L    CO2 28 22 - 32 mmol/L    Glucose, Bld 132 (H) 65 - 99 mg/dL    BUN 6 6 - 20 mg/dL    Creatinine, Ser <0.30 (L) 0.44 - 1.00 mg/dL    Calcium 8.6 (L) 8.9 - 10.3 mg/dL    GFR calc non Af Amer NOT CALCULATED >60 mL/min    GFR calc Af Amer NOT CALCULATED >60 mL/min      Comment: (NOTE) The eGFR has been calculated using the CKD  EPI equation. This calculation has not been validated in all clinical situations. eGFR's persistently <60 mL/min signify possible Chronic Kidney Disease.      Anion gap 10 5 - 15  Magnesium     Status: None    Collection Time: 09/29/16  5:33 AM  Result Value Ref Range    Magnesium 1.9 1.7 - 2.4 mg/dL  Phosphorus     Status: None    Collection Time: 09/29/16  5:33 AM  Result Value Ref Range    Phosphorus 3.3 2.5 - 4.6 mg/dL  Osmolality     Status: Abnormal    Collection Time: 09/29/16  4:42 PM  Result Value Ref Range    Osmolality 269 (L) 275 - 295 mOsm/kg  Na and K (sodium & potassium), rand urine     Status: None    Collection Time: 09/29/16  5:29 PM  Result Value Ref Range    Sodium, Ur 58 mmol/L    Potassium Urine 22 mmol/L  Creatinine, urine, random     Status: None    Collection Time: 09/29/16  5:29 PM  Result Value Ref Range    Creatinine, Urine 32.57 mg/dL  Osmolality, urine     Status: None    Collection Time: 09/29/16  5:29 PM  Result Value Ref Range    Osmolality, Ur 393 300 - 900 mOsm/kg       Imaging Results (Last 48 hours)  Ir Replc Gastro/colonic Tube Percut W/fluoro   Result Date: 09/29/2016 INDICATION: Gastrostomy tube malfunction. EXAM: FLUOROSCOPIC GUIDED REPLACEMENT OF GASTROSTOMY TUBE COMPARISON:  None. MEDICATIONS: None. CONTRAST:  A total 20 cc Isovue-300 was administered into the gastric lumen FLUOROSCOPY TIME:  18 seconds (0.4 mGy) COMPLICATIONS: None immediate. PROCEDURE: The upper abdomen and external portion of the existing gastrostomy tube was prepped and draped in the usual sterile fashion, and a sterile drape was applied covering the operative field. Maximum barrier sterile technique with sterile gowns and gloves were used for the procedure. A timeout was performed prior to the initiation of the procedure. The existing gastrostomy tube was injected with a small amount of contrast confirming appropriate positioning within the gastric lumen. The  existing gastrostomy tube was removed intact. Next, initially an 88 and ultimately a 27 [place] gastrostomy balloon retention gastrostomy tube was advanced through the gastrostomy tract. The balloon was inflated and disc was cinched. Contrast was  injected and several spot fluoroscopic images were obtained in various obliquities to confirm appropriate intraluminal positioning. A dressing was placed. The patient tolerated the procedure well without immediate postprocedural complication. IMPRESSION: Successful fluoroscopic guided replacement of a new 22-French gastrostomy tube. The new gastrostomy tube is ready for immediate use. Electronically Signed   By: Sandi Mariscal M.D.   On: 09/29/2016 09:56    Dg Chest Port 1 View   Result Date: 09/29/2016 CLINICAL DATA:  Respiratory failure EXAM: PORTABLE CHEST 1 VIEW COMPARISON:  09/26/2016 chest radiograph. FINDINGS: Tracheostomy tube tip overlies the tracheal air column at the level of the thoracic inlet. Gastrostomy tube terminates over the proximal stomach. Stable cardiomediastinal silhouette with normal heart size. No pneumothorax. No pleural effusion. Stable patchy left retrocardiac opacity. Stable scarring versus atelectasis at the left costophrenic angle. No pulmonary edema. IMPRESSION: 1. Well-positioned support structures. 2. Stable patchy left retrocardiac opacity and mild left costophrenic angle scarring versus atelectasis. Electronically Signed   By: Ilona Sorrel M.D.   On: 09/29/2016 07:52             Medical Problem List and Plan: 1.  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  DVT Prophylaxis/Anticoagulation: Pharmaceutical: Lovenox 3. Pain Management: N/A 4. Mood: LCSW to follow for evaluation and support.  5. Neuropsych: This patient is not capable of making decisions on her own behalf. 6. Skin/Wound Care: Air mattress overlay. Routine pressure relief measures. 7.  Fluids/Electrolytes/Nutrition: Strict I/O. Continue tube feeds at current rate. Consult dietitian for input.  8. MRSA tracheobronchitis: Continue Vancomycin --question duration of treatment.  9. Hyponatremia: Improving with normal saline. Will monitor BMET daily for now 10. Urinary retention: Monitor voiding with PVR checks --cath for volumes > 350cc 11. HTN: Monitor BP bid. Continue Norvasc 12. H/o constipation: Will continue current regimen for now.  13. VDRF: Chest PT bid. ATC for humidification. Suction prn.  14. Hypokalemia: Add supplement--likely dilutional.      Post Admission Physician Evaluation: 1. Functional deficits secondary  to spatic tetraparesis with deconditioning. 2. Patient is admitted to receive collaborative, interdisciplinary care between the physiatrist, rehab nursing staff, and therapy team. 3. Patient's level of medical complexity and substantial therapy needs in context of that medical necessity cannot be provided at a lesser intensity of care such as a SNF. 4. Patient has experienced substantial functional loss from his/her baseline which was documented above under the "Functional History" and "Functional Status" headings.  Judging by the patient's diagnosis, physical exam, and functional history, the patient has potential for functional progress which will result in measurable gains while on inpatient rehab.  These gains will be of substantial and practical use upon discharge  in facilitating mobility and self-care at the household level. 5. Physiatrist will provide 24 hour management of medical needs as well as oversight of the therapy plan/treatment and provide guidance as appropriate regarding the interaction of the two. 6. The Preadmission Screening has been reviewed and patient status is unchanged unless otherwise stated above. 7. 24 hour rehab nursing will assist with bladder management, bowel management, safety, skin/wound care, disease management, medication  administration, pain management and patient education  and help integrate therapy concepts, techniques,education, etc. 8. PT will assess and treat for/with: pre gait, gait training, endurance , safety, equipment, neuromuscular re education.   Goals are: modA. 9. OT will assess and treat for/with: ADLs, Cognitive perceptual skills, Neuromuscular re education, safety, endurance, equipment.   Goals are: max/tot A Therapy  may not proceed with showering this patient. 10. SLP will assess and treat for/with: communication, cognition.  Goals are: follow basic commands with min cueing. 11. Case Management and Social Worker will assess and treat for psychological issues and discharge planning. 12. Team conference will be held weekly to assess progress toward goals and to determine barriers to discharge. 13. Patient will receive at least 3 hours of therapy per day at least 5 days per week. 14. ELOS: 20-25d     15. Prognosis:  fair         Charlett Blake M.D. [facility] FAAPM&R (Sports Med, Neuromuscular Med) Diplomate Am Board of Electrodiagnostic Med  Flora Lipps 09/30/2016

## 2016-09-30 NOTE — Progress Notes (Signed)
Patient failed to void since the removal of the foley catheter.  Bladder scan showed 445 mL.  Performed an In/out on the patient and 600 mL was collected. Notified the NP on call. Will continue to monitor the patient.

## 2016-10-01 ENCOUNTER — Inpatient Hospital Stay (HOSPITAL_COMMUNITY): Payer: Medicare Other | Admitting: Occupational Therapy

## 2016-10-01 ENCOUNTER — Inpatient Hospital Stay (HOSPITAL_COMMUNITY): Payer: Medicare Other | Admitting: Physical Therapy

## 2016-10-01 ENCOUNTER — Ambulatory Visit: Payer: Medicare Other | Admitting: Rehabilitation

## 2016-10-01 ENCOUNTER — Inpatient Hospital Stay (HOSPITAL_COMMUNITY): Payer: Medicare Other | Admitting: Speech Pathology

## 2016-10-01 DIAGNOSIS — A852 Arthropod-borne viral encephalitis, unspecified: Secondary | ICD-10-CM

## 2016-10-01 LAB — COMPREHENSIVE METABOLIC PANEL
ALK PHOS: 190 U/L — AB (ref 38–126)
ALT: 56 U/L — AB (ref 14–54)
AST: 36 U/L (ref 15–41)
Albumin: 3.1 g/dL — ABNORMAL LOW (ref 3.5–5.0)
Anion gap: 10 (ref 5–15)
BUN: 8 mg/dL (ref 6–20)
CALCIUM: 9.5 mg/dL (ref 8.9–10.3)
CHLORIDE: 93 mmol/L — AB (ref 101–111)
CO2: 31 mmol/L (ref 22–32)
Glucose, Bld: 133 mg/dL — ABNORMAL HIGH (ref 65–99)
Potassium: 3.4 mmol/L — ABNORMAL LOW (ref 3.5–5.1)
Sodium: 134 mmol/L — ABNORMAL LOW (ref 135–145)
Total Bilirubin: 1.1 mg/dL (ref 0.3–1.2)
Total Protein: 7.1 g/dL (ref 6.5–8.1)

## 2016-10-01 LAB — CBC WITH DIFFERENTIAL/PLATELET
BASOS ABS: 0 10*3/uL (ref 0.0–0.1)
Basophils Relative: 0 %
EOS PCT: 3 %
Eosinophils Absolute: 0.2 10*3/uL (ref 0.0–0.7)
HCT: 42.8 % (ref 36.0–46.0)
HEMOGLOBIN: 14.4 g/dL (ref 12.0–15.0)
LYMPHS PCT: 16 %
Lymphs Abs: 1.2 10*3/uL (ref 0.7–4.0)
MCH: 28.7 pg (ref 26.0–34.0)
MCHC: 33.6 g/dL (ref 30.0–36.0)
MCV: 85.3 fL (ref 78.0–100.0)
Monocytes Absolute: 0.9 10*3/uL (ref 0.1–1.0)
Monocytes Relative: 12 %
NEUTROS ABS: 5.2 10*3/uL (ref 1.7–7.7)
NEUTROS PCT: 69 %
PLATELETS: 412 10*3/uL — AB (ref 150–400)
RBC: 5.02 MIL/uL (ref 3.87–5.11)
RDW: 13 % (ref 11.5–15.5)
WBC: 7.5 10*3/uL (ref 4.0–10.5)

## 2016-10-01 LAB — GLUCOSE, CAPILLARY
GLUCOSE-CAPILLARY: 135 mg/dL — AB (ref 65–99)
Glucose-Capillary: 137 mg/dL — ABNORMAL HIGH (ref 65–99)
Glucose-Capillary: 168 mg/dL — ABNORMAL HIGH (ref 65–99)

## 2016-10-01 MED ORDER — LIDOCAINE HCL 2 % EX GEL
CUTANEOUS | Status: DC | PRN
  Filled 2016-10-01 (×2): qty 5

## 2016-10-01 MED ORDER — POTASSIUM CHLORIDE 20 MEQ PO PACK
20.0000 meq | PACK | Freq: Every day | ORAL | Status: DC
  Administered 2016-10-02: 20 meq via ORAL
  Filled 2016-10-01 (×2): qty 1

## 2016-10-01 MED ORDER — OSMOLITE 1.5 CAL PO LIQD
1000.0000 mL | ORAL | Status: DC
  Administered 2016-10-02 – 2016-10-05 (×4): 1000 mL
  Filled 2016-10-01 (×8): qty 1000

## 2016-10-01 MED ORDER — OSMOLITE 1.5 CAL PO LIQD: 1000.0000 mL | ORAL | Status: DC

## 2016-10-01 NOTE — Progress Notes (Signed)
[facility] PHYSICAL MEDICINE & REHABILITATION     PROGRESS NOTE    Subjective/Complaints: Had a fair night. RN reports no problems. Judy Spears denies pain and says breathing is ok at present  ROS: Limited due to cognitive/behavioral   Objective: Vital Signs: Blood pressure 114/85, pulse 85, temperature (!) 97.4 F (36.3 C), temperature source Axillary, resp. rate (!) 22, height 5\' 2"  (1.575 m), weight 42 kg (92 lb 9.6 oz), last menstrual period 12/01/2010, SpO2 100 %. Ir Replc Gastro/colonic Tube Percut W/fluoro  Result Date: 09/29/2016 INDICATION: Gastrostomy tube malfunction. EXAM: FLUOROSCOPIC GUIDED REPLACEMENT OF GASTROSTOMY TUBE COMPARISON:  None. MEDICATIONS: None. CONTRAST:  A total 20 cc Isovue-300 was administered into the gastric lumen FLUOROSCOPY TIME:  18 seconds (0.4 mGy) COMPLICATIONS: None immediate. PROCEDURE: The upper abdomen and external portion of the existing gastrostomy tube was prepped and draped in the usual sterile fashion, and a sterile drape was applied covering the operative field. Maximum barrier sterile technique with sterile gowns and gloves were used for the procedure. A timeout was performed prior to the initiation of the procedure. The existing gastrostomy tube was injected with a small amount of contrast confirming appropriate positioning within the gastric lumen. The existing gastrostomy tube was removed intact. Next, initially an 70 and ultimately a 44 [place] gastrostomy balloon retention gastrostomy tube was advanced through the gastrostomy tract. The balloon was inflated and disc was cinched. Contrast was injected and several spot fluoroscopic images were obtained in various obliquities to confirm appropriate intraluminal positioning. A dressing was placed. The patient tolerated the procedure well without immediate postprocedural complication. IMPRESSION: Successful fluoroscopic guided replacement of a new 22-French gastrostomy tube. The new gastrostomy tube is ready  for immediate use. Electronically Signed   By: Sandi Mariscal M.D.   On: 09/29/2016 09:56    Recent Labs  09/29/16 0533 10/01/16 0639  WBC 7.4 7.5  HGB 12.9 14.4  HCT 38.3 42.8  PLT 344 412*    Recent Labs  09/30/16 0955 10/01/16 0639  NA 131* 134*  K 3.1* 3.4*  CL 92* 93*  GLUCOSE 138* 133*  BUN 6 8  CREATININE 0.32* <0.30*  CALCIUM 9.0 9.5   CBG (last 3)   Recent Labs  09/30/16 1953 09/30/16 2325 10/01/16 0348  GLUCAP 151* 111* 135*    Wt Readings from Last 3 Encounters:  09/30/16 42 kg (92 lb 9.6 oz)  09/30/16 44.2 kg (97 lb 6.4 oz)  07/14/16 46.5 kg (102 lb 9.6 oz)    Physical Exam:  Constitutional: She appears listless. She appears cachectic. No distress.  HENT:  Head: Normocephalicand atraumatic.  Mouth/Throat: Oropharynx is clear but dry  Eyes: Pupils are equal, round, and reactive to light. Conjunctivaeare normal.  Neck:  Decreased ROM. Cardiovascular: Normal rateand regular rhythm.  Respiratory: generally comfortable, diffuse rhonchi, shallow breaths.  GI: Soft. Bowel sounds are normal. She exhibits no distension. There is no tenderness.  Genitourinary:  Genitourinary Comments: labial/perineal area sl red/swollen.  Musculoskeletal: She exhibits no edemaor tenderness.  Neuro: Ongoing flexor tone in UE's with rhythmic movements/dyskinesias noted. LE's with hamstring tightness LLE, extensor tone RLE.  She appears alert, tracks me when I talk. Attempts to mouth answers to my questions Skin: a few scattered abrasions Psych: flat  Assessment/Plan: 1. Functional and mobility deficits secondary to recent [place] encephalitis and subsequent pneumonia/bronchitis/respiratory sequelae which require 3+ hours per day of interdisciplinary therapy in a comprehensive inpatient rehab setting. Physiatrist is providing close team supervision and 24 hour management of active  medical problems listed below. Physiatrist and rehab team continue to assess barriers  to discharge/monitor patient progress toward functional and medical goals.  Function:  Bathing Bathing position      Bathing parts      Bathing assist        Upper Body Dressing/Undressing Upper body dressing                    Upper body assist        Lower Body Dressing/Undressing Lower body dressing                                  Lower body assist        Toileting Toileting          Toileting assist     Transfers Chair/bed transfer             Locomotion Ambulation           Wheelchair          Cognition Comprehension    Expression    Social Interaction    Problem Solving    Memory     Medical Problem List and Plan: 1. 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 I suspect she has worsening encephalomalacia as a chronic effect of the viral insult.  2. DVT Prophylaxis/Anticoagulation: Pharmaceutical: Lovenox 3. Pain Management: N/A 4. Mood: LCSW to follow for evaluation and support.  5. Neuropsych: This patient is notcapable of making decisions on herown behalf. 6. Skin/Wound Care: Air mattress overlay. Routine pressure relief measures. 7. Fluids/Electrolytes/Nutrition: Strict I/O. Continue tube feeds at current rate. Consulted dietitian for input.   -I personally reviewed the patient's labs today.   8. MRSA tracheobronchitis: Continue Vancomycin --question duration of treatment.  -follow up with [facility] re: duration of treatment 9. Hyponatremia: Improving with normal saline. Will monitor BMET daily for now 10. Urinary retention: Monitor voiding with PVR checks --cath for volumes > 350cc 11. HTN: Monitor BP bid. Continue Norvasc 12. H/o constipation: Will continue current regimen for  now.  13. VDRF: Chest PT bid. ATC for humidification. Suction prn. HOB elevated, aspiration precautions  14. Hypokalemia: Add supplement--likely dilutional.    LOS (Days) 1 A FACE TO FACE EVALUATION WAS PERFORMED  Meredith Staggers, MD 10/01/2016 8:28 AM

## 2016-10-01 NOTE — Evaluation (Signed)
Occupational Therapy Assessment and Plan  Patient Details  Name: Judy Spears MRN: [id number] Date of Birth: [birth year]  OT Diagnosis: abnormal posture, ataxia, cognitive deficits, disturbance of vision and muscle weakness (generalized) Rehab Potential: Rehab Potential (ACUTE ONLY): Fair ELOS: 16-18 days   Today's Date: 10/01/2016 OT Individual Time:1100-1200 OT Total Time: 60 min       Problem List:  Patient Active Problem List   Diagnosis Date Noted  . Severe muscle deconditioning 09/30/2016  . Pressure injury of skin 09/28/2016  . Respiratory failure ([place])   . Tracheobronchitis   . Urinary retention   . PEG (percutaneous endoscopic gastrostomy) status ([place])   . Benign essential HTN   . Hyponatremia   . Hypokalemia   . Reactive hypertension   . Respiratory insufficiency 09/24/2016  . Malignant neoplasm of upper-inner quadrant of left breast in female, estrogen receptor positive ([place]) 07/14/2016  . Heterotopic ossification of bone 03/10/2016  . Acute on chronic respiratory failure with hypoxia ([place])   . Acute hypoxemic respiratory failure ([place])   . Acute blood loss anemia   . Atelectasis 02/04/2016  . Debility 02/03/2016  . Pneumonia of both lower lobes due to Pseudomonas species ([place])   . Pneumonia of both lower lobes due to methicillin susceptible Staphylococcus aureus (MSSA) ([place])   . Acute respiratory failure with hypoxia ([place])   . Acute encephalopathy   . Seizures ([place])   . Sepsis ([place])   . Hypoxia   . Pain   . HCAP (healthcare-associated pneumonia) 12/28/2015  . Chronic respiratory failure ([place]) 12/28/2015  . Acute tracheobronchitis 12/24/2015  . Tracheostomy tube present ([place])   . Tracheal stenosis   . Chronic respiratory failure with hypoxia ([place])   . Spastic tetraplegia ([place]) 10/20/2015  . Tracheostomy status ([place]) 09/26/2015  . Allergic rhinitis 09/26/2015  . 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 Movement disorder 09/22/2015  . Encephalitis  09/22/2015  . Chest pain 02/07/2014  . Premature atrial contractions 01/13/2014  . Abnormality of gait 12/04/2013  . Hyperlipidemia 12/21/2011  . Cardiovascular risk factor 12/21/2011  . Bunion 01/31/2008  . Metatarsalgia of both feet 09/20/2007  . FLAT FOOT 09/20/2007  . HALLUX RIGIDUS, ACQUIRED 09/20/2007    Past Medical History:  Past Medical History:  Diagnosis Date  . Arthritis    lt great toe  . Complication of anesthesia    pt has had headaches post op-did advise to hydra well preop  . Hair loss    right-sided  . History of exercise stress test    a. ETT (12/15) with 12:00 exercise, no ST changes, occasional PACs.   . Hyperlipidemia   . Insomnia   . Migraine headache   . Movement disorder    resltess in left legs  . Postmenopausal   . 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 Premature atrial contractions    a. Holter (12/15) with 8% PACs, no atrial runs or atrial fibrillation.    Past Surgical History:  Past Surgical History:  Procedure Laterality Date  . BREAST BIOPSY  01/01/2011   Procedure: BREAST BIOPSY WITH NEEDLE LOCALIZATION;  Surgeon: Rolm Bookbinder, MD;  Location: [facility];  Service: General;  Laterality: Left;  left breast wire localization biopsy  . BREAST BIOPSY Right 07/27/2012  . BREAST BIOPSY Right 07/07/2016  . BREAST BIOPSY Left 06/30/2016  . cataracts right eye    . CESAREAN SECTION  1986  . HERNIA REPAIR  2000   [facility]  . IR REPLC GASTRO/COLONIC TUBE PERCUT W/FLUORO  09/29/2016  . opirectinal membrane peel    . PEG PLACEMENT    . RHINOPLASTY  1983  . TRACHEOSTOMY      Assessment & Plan Clinical Impression: Judy Spears a 66 y.o.femalewith 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 History taken from patient and daughters. Patient known to CIR from prior stay and was  involved in outpatient therapy. She was admitted on 09/24/16 with lethargy. SOB and low grade fevers due to MRSA tracheobronchitis v/s PNA. She was started on IV vancomycin as well as chest PT. Respiratory status improving with treatment and foley placed due to urinary retention. PEG tube evaluated by IVR and was exchanged out on 9/5. Secretions are improving and PCCM recommends chest PT to help with mobilization of secretions. Progressive hyponateremia noted and water flushes Foley discontinued yesterday and has required I/O caths for volumes 400- 600 cc. Patient has had significant decline in mobility and ability to carryout ADL tasks. CIR recommended by rehab team.  Patient transferred to CIR on 09/30/2016 .    Patient currently requires total with basic self-care skills secondary to muscle weakness, muscle joint tightness and muscle paralysis, decreased cardiorespiratoy endurance, abnormal tone, unbalanced muscle activation, motor apraxia, ataxia, decreased coordination and decreased motor planning, decreased visual motor skills, decreased awareness and delayed processing and decreased sitting balance, decreased standing balance, decreased postural control and decreased balance strategies.  Prior to hospitalization, patient could complete ADLs with mod- max A from w/c level. She has 24 hr paid caregiver support..  Patient will benefit from skilled intervention to decrease level of assist with basic self-care skills prior to discharge home with care partner.  Anticipate patient will require max- total A and follow up outpatient.  OT - End of Session Activity Tolerance: Tolerates < 10 min activity with changes in vital signs Endurance Deficit: Yes Endurance Deficit Description: Pt on 5L supplemental O2 with drop in O2 levels with activity OT Assessment Rehab Potential (ACUTE ONLY): Fair OT Patient demonstrates impairments in the following area(s):  Balance;Endurance;Safety;Cognition;Motor;Sensory;Pain OT Basic ADL's Functional Problem(s): Eating;Grooming;Bathing;Dressing;Toileting OT Transfers Functional Problem(s): Toilet OT Additional Impairment(s): Fuctional Use of Upper Extremity OT Plan OT Intensity: Minimum of 1-2 x/day, 45 to 90 minutes OT Frequency: 5 out of 7 days OT Treatment/Interventions: Balance/vestibular training;Discharge planning;DME/adaptive equipment instruction;Functional mobility training;Neuromuscular re-education;Pain management;Patient/family education;Psychosocial support;Self Care/advanced ADL retraining;Skin care/wound managment;Splinting/orthotics;Therapeutic Activities;Therapeutic Exercise;UE/LE Strength taining/ROM;UE/LE Coordination activities;Wheelchair propulsion/positioning OT Self Feeding Anticipated Outcome(s): Total A/ PEG OT Basic Self-Care Anticipated Outcome(s): Total A OT Toileting Anticipated Outcome(s): Total A OT Bathroom Transfers Anticipated Outcome(s): Max- total A OT Recommendation Patient destination: Home Follow Up Recommendations: Outpatient OT Equipment Details: Pt has all needed DME   Skilled Therapeutic Intervention Pt seen for OT eval and session focusing on functional transfers and activity tolerance. Pt sitting up in tilt-in-space w/c upon arrival with personal care attendant present. Pt smiling in agreeable to tx session. Completed basic grooming tasks from w/c level at sink, required hand over hand assist to use R UE to wash face due to extreme flexor tone.  Used STEDY for transfer w/c <> BSC. Required +2 max A to stand into STEDY. She required close supervision progressing to mod-max A for sitting balance on BSC with STEDY placed in front for safety as pt with strong L lean, unable to self correct despite multimodal cuing. When standing in STEDY, pt with L LE flexor tone  requiring total A to maintain foot in supported position during standing as pt's foot completely underneath her  and off the floor when not being managed by therapist.  She returned to supine at end of session, RN and personal care attendant present.  Discussed with pt and caregiver throughout session regarding role of OT, POC, and d/c planning.   OT Evaluation Precautions/Restrictions  Precautions Precautions: Fall Precaution Comments: Trach, PEG Restrictions Weight Bearing Restrictions: No General Chart Reviewed: Yes Additional Pertinent History: history of [place] encephalitis Vital Signs Therapy Vitals Pulse Rate: 93 Resp: (!) 22 Patient Position (if appropriate): Lying Oxygen Therapy SpO2: 97 % O2 Device: Tracheostomy Collar O2 Flow Rate (L/min): 6 L/min FiO2 (%): 28 % Pain   Faces: Does not appear in pain Home Living/Prior Functioning Home Living Family/patient expects to be discharged to:: Private residence Living Arrangements: Spouse/significant other Available Help at Discharge: Family, Personal care attendant, Available 24 hours/day Type of Home: House Home Access: Ramped entrance Home Layout: Two level, Bed/bath upstairs Alternate Level Stairs-Number of Steps: chair lift Bathroom Shower/Tub: Walk-in shower (Has roll in shower chair) Bathroom Toilet: Handicapped height Bathroom Accessibility: Yes  Lives With: Spouse IADL History Homemaking Responsibilities: No Current License: No Occupation: Retired Type of Occupation: Retired Scientist, forensic Level of Independence: Needs assistance with tranfers, Needs assistance with ADLs, Needs assistance with homemaking, Needs assistance with gait Driving: No Vision Baseline Vision/History:  (hx of reading glasses, however, care attenant reports pt has worn them in past several months) Additional Comments: Unable to formally assess due to cognitive and communication deficits Praxis Praxis-Other Comments: Unable to assess due UEs not being at functional level Cognition Overall Cognitive Status: Impaired/Different from  baseline Arousal/Alertness: Lethargic Orientation Level:  (Unable to assess as pt non-verbal) Attention: Focused Focused Attention: Impaired Focused Attention Impairment: Verbal basic;Functional basic Problem Solving: Impaired Problem Solving Impairment: Verbal basic;Functional basic Comments: Pt able to respond to yes/no questions with head nods intermittently Sensation Sensation Additional Comments: Pt appeared to be hypersensitive to touch on bilat LE. Observed facial grimaces while putting on pants and handling LE Coordination Gross Motor Movements are Fluid and Coordinated: No Fine Motor Movements are Fluid and Coordinated: No Coordination and Movement Description: Pt presents with spasticity and tremors in B UEs/LEs Motor  Motor Motor: Abnormal tone;Abnormal postural alignment and control;Motor apraxia Motor - Skilled Clinical Observations: L UE/LE flexor tone affecting mobility Mobility  Bed Mobility Rolling Right: 1: +2 Total assist Rolling Right Details: Manual facilitation for weight shifting;Tactile cues for initiation;Verbal cues for sequencing Rolling Left: 1: +2 Total assist Rolling Left Details: Manual facilitation for weight shifting;Tactile cues for initiation;Verbal cues for sequencing Supine to Sit: 1: +2 Total assist (+2) Supine to Sit Details: Manual facilitation for weight shifting;Tactile cues for initiation;Verbal cues for sequencing Supine to Sit Details (indicate cue type and reason):  (Pt left sitting up in chair at end of session)  Trunk/Postural Assessment  Cervical Assessment Cervical Assessment: Exceptions to [facility] (Forward head) Thoracic Assessment Thoracic Assessment: Exceptions to [facility] (side bend to L due to tone) Lumbar Assessment Lumbar Assessment: Exceptions to [facility] (Posterior pelvic tilt) Postural Control Postural Control: Deficits on evaluation (L lean)  Balance Balance Balance Assessed: Yes Static Sitting Balance Static Sitting - Balance  Support: Feet supported;Right upper extremity supported Static Sitting - Level of Assistance: 3: Mod assist Static Sitting - Comment/# of Minutes: Mod/max assist on EOB, on mat table able to demonstrate sitting 15 seconds with min guard and right UE support before loss  of balance to left side  Dynamic Sitting Balance Sitting balance - Comments: Static sitting on BSC with STEDY placed in front, pt with strong L lean when fatigued, unable to correct due to tone Static Standing Balance Static Standing - Balance Support: Bilateral upper extremity supported Static Standing - Level of Assistance: 1: +2 Total assist Static Standing - Comment/# of Minutes: in steady  Extremity/Trunk Assessment RUE Assessment RUE Assessment: Exceptions to [facility] RUE Strength RUE Overall Strength Comments: Pt can raise UE against gravity; can grasp items, however, unable to release items RUE Tone RUE Tone: Severe;Hypertonic LUE Assessment LUE Assessment: Exceptions to [facility] LUE Strength LUE Overall Strength Comments: WFL assessed functionally LUE Tone LUE Tone: Severe;Hypertonic LUE Tone Comments: elbow contracture, unable to extend past 90 degrees elbow flexion   See Function Navigator for Current Functional Status.   Refer to Care Plan for Long Term Goals  Recommendations for other services: None    Discharge Criteria: Patient will be discharged from OT if patient refuses treatment 3 consecutive times without medical reason, if treatment goals not met, if there is a change in medical status, if patient makes no progress towards goals or if patient is discharged from hospital.  The above assessment, treatment plan, treatment alternatives and goals were discussed and mutually agreed upon: by patient and by family  Ernestina Patches 10/01/2016, 4:16 PM

## 2016-10-01 NOTE — Progress Notes (Signed)
Physical Therapy Assessment and Plan  Patient Details  Name: Judy Spears MRN: [id number] Date of Birth: [birth year]  PT Diagnosis: Abnormal posture, Contracture of joint: left elbow, Coordination disorder, Hypertonia and Impaired cognition Rehab Potential: Fair ELOS: 16-18 days   Today's Date: 10/01/2016 PT Individual Time: 7618-4859 PT Individual Time Calculation (min): 48 min    Problem List:  Patient Active Problem List   Diagnosis Date Noted  . Severe muscle deconditioning 09/30/2016  . Pressure injury of skin 09/28/2016  . Respiratory failure (HCC)   . Tracheobronchitis   . Urinary retention   . PEG (percutaneous endoscopic gastrostomy) status (HCC)   . Benign essential HTN   . Hyponatremia   . Hypokalemia   . Reactive hypertension   . Respiratory insufficiency 09/24/2016  . Malignant neoplasm of upper-inner quadrant of left breast in female, estrogen receptor positive (HCC) 07/14/2016  . Heterotopic ossification of bone 03/10/2016  . Acute on chronic respiratory failure with hypoxia (HCC)   . Acute hypoxemic respiratory failure (HCC)   . Acute blood loss anemia   . Atelectasis 02/04/2016  . Debility 02/03/2016  . Pneumonia of both lower lobes due to Pseudomonas species (HCC)   . Pneumonia of both lower lobes due to methicillin susceptible Staphylococcus aureus (MSSA) (HCC)   . Acute respiratory failure with hypoxia (HCC)   . Acute encephalopathy   . Seizures (HCC)   . Sepsis (HCC)   . Hypoxia   . Pain   . HCAP (healthcare-associated pneumonia) 12/28/2015  . Chronic respiratory failure (HCC) 12/28/2015  . Acute tracheobronchitis 12/24/2015  . Tracheostomy tube present (HCC)   . Tracheal stenosis   . Chronic respiratory failure with hypoxia (HCC)   . Spastic tetraplegia (HCC) 10/20/2015  . Tracheostomy status (HCC) 09/26/2015  . Allergic rhinitis 09/26/2015  . 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 Movement disorder 09/22/2015  . Encephalitis  09/22/2015  . Chest pain 02/07/2014  . Premature atrial contractions 01/13/2014  . Abnormality of gait 12/04/2013  . Hyperlipidemia 12/21/2011  . Cardiovascular risk factor 12/21/2011  . Bunion 01/31/2008  . Metatarsalgia of both feet 09/20/2007  . FLAT FOOT 09/20/2007  . HALLUX RIGIDUS, ACQUIRED 09/20/2007    Past Medical History:  Past Medical History:  Diagnosis Date  . Arthritis    lt great toe  . Complication of anesthesia    pt has had headaches post op-did advise to hydra well preop  . Hair loss    right-sided  . History of exercise stress test    a. ETT (12/15) with 12:00 exercise, no ST changes, occasional PACs.   . Hyperlipidemia   . Insomnia   . Migraine headache   . Movement disorder    resltess in left legs  . Postmenopausal   . 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 Premature atrial contractions    a. Holter (12/15) with 8% PACs, no atrial runs or atrial fibrillation.    Past Surgical History:  Past Surgical History:  Procedure Laterality Date  . BREAST BIOPSY  01/01/2011   Procedure: BREAST BIOPSY WITH NEEDLE LOCALIZATION;  Surgeon: Emelia Loron, MD;  Location: [facility];  Service: General;  Laterality: Left;  left breast wire localization biopsy  . BREAST BIOPSY Right 07/27/2012  . BREAST BIOPSY Right 07/07/2016  . BREAST BIOPSY Left 06/30/2016  . cataracts right eye    . CESAREAN SECTION  1986  . HERNIA REPAIR  2000   [facility]  . IR REPLC GASTRO/COLONIC TUBE PERCUT W/FLUORO  09/29/2016  .  opirectinal membrane peel    . PEG PLACEMENT    . RHINOPLASTY  1983  . 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 History taken from patient and daughters. Patient known to CIR from prior stay and was  involved in outpatient therapy. She was admitted on 09/24/16 with lethargy. SOB and low grade fevers due to MRSA tracheobronchitis v/s PNA. She was started on IV vancomycin as well as chest PT. Respiratory status improving with treatment and foley placed due to urinary retention. PEG tube evaluated by IVR and was exchanged out on 9/5. Secretions are improving and PCCM recommends chest PT to help with mobilization of secretions. Progressive hyponateremia noted and water flushes Foley discontinued yesterday and has required I/O caths for volumes 400- 600 cc. Patient has had significant decline in mobility and ability to carryout ADL tasks.  .  Patient transferred to CIR on 09/30/2016 .   Patient currently requires total with mobility secondary to muscle weakness and muscle joint tightness, decreased cardiorespiratoy endurance and decreased oxygen support, abnormal tone, motor apraxia, decreased coordination and decreased motor planning, decreased initiation, decreased attention, decreased awareness and delayed processing and decreased sitting balance, decreased standing balance, decreased postural control and decreased balance strategies.  Prior to hospitalization, patient was mod with mobility and lived with  husband in a House home.  Home access is  Ramped entrance.  Patient will benefit from skilled PT intervention to maximize safe functional mobility, minimize fall risk and decrease caregiver burden for planned discharge home with 24 hour assist.  Anticipate patient will benefit from follow up OP at discharge.  PT - End of Session Activity Tolerance: Tolerates < 10 min activity with changes in vital signs Endurance Deficit: Yes Endurance Deficit Description: Pt on 5L supplemental O2 with drop in O2 levels with activity PT Assessment Rehab Potential (ACUTE/IP ONLY): Fair PT Barriers to Discharge: New oxygen;Neurogenic Bowel & Bladder;Medical stability;Incontinence;Weight PT Patient demonstrates  impairments in the following area(s): Balance;Endurance;Motor;Nutrition;Pain;Safety;Sensory;Skin Integrity PT Transfers Functional Problem(s): Bed Mobility;Bed to Chair PT Plan PT Intensity: Minimum of 1-2 x/day ,45 to 90 minutes PT Frequency: 5 out of 7 days PT Duration Estimated Length of Stay: 16-18 days PT Treatment/Interventions: Discharge planning;Functional mobility training;DME/adaptive equipment instruction;Splinting/orthotics;Pain management;Therapeutic Activities;UE/LE Strength taining/ROM;Balance/vestibular training;Community reintegration;Neuromuscular re-education;Patient/family education;Skin care/wound management;Therapeutic Exercise;UE/LE Coordination activities;Wheelchair propulsion/positioning;Visual/perceptual remediation/compensation;Disease management/prevention PT Transfers Anticipated Outcome(s): Max assist  PT Locomotion Anticipated Outcome(s): N/A PT Recommendation Recommendations for Other Services: Speech consult;Therapeutic Recreation consult;Neuropsych consult Therapeutic Recreation Interventions: Stress management;Pet therapy Follow Up Recommendations: Outpatient PT;24 hour supervision/assistance Patient destination: Home Equipment Recommended: None recommended by PT Equipment Details: Family has all appropriate equimpment at home   Skilled Therapeutic Intervention Pt received lying in bed, husband and caregiver present. Mobility assessed as below. Pt required total assist +2 for all mobility. Steady for bed/chair transfers. Home environment discussed with family. Educated on rehab process. Pt left sitting up in chair, all needs in reach.   PT Evaluation Precautions/Restrictions Precautions Precautions: Fall Restrictions Weight Bearing Restrictions: No General Chart Reviewed: Yes Additional Pertinent History: PMH of [place] encephalitis with complicated course, PEG/trach dependent, recent diagnosis of breast cancer s/p lumpectomy.  Vital SignsTherapy  Vitals Pulse Rate: 93 Resp: (!) 22 Patient Position (if appropriate): Lying Oxygen Therapy SpO2: 97 % O2 Device: Tracheostomy Collar O2 Flow Rate (  L/min): 6 L/min FiO2 (%): 28 % Pain  Pt unable to express pain. Displayed some facial grimaces with movement of LE.  Home Living/Prior Functioning Home Living Available Help at Discharge: Family;Personal care attendant;Available 24 hours/day Type of Home: House Home Access: Ramped entrance Home Layout: Two level;Bed/bath upstairs Alternate Level Stairs-Number of Steps: chair lift Bathroom Shower/Tub: Walk-in shower Bathroom Toilet: Handicapped height Prior Function Level of Independence: Needs assistance with tranfers;Needs assistance with ADLs;Needs assistance with homemaking;Needs assistance with gait Driving: No Vision/Perception  Vision - Assessment Additional Comments: no visual deficits effecting mobility  Cognition Orientation Level: Intubated/Tracheostomy - Unable to assess Comments: Pt able to respond to yes/no questions with head nods and thumbs up/down  Sensation Sensation Additional Comments: Pt appeared to be hypersensitive to touch on bilat LE. Observed facial grimaces while putting on pants and handling LE Coordination Gross Motor Movements are Fluid and Coordinated: No Fine Motor Movements are Fluid and Coordinated: No Coordination and Movement Description: Pt presents with spasticity and tremors in hands and LE Motor  Motor Motor: Abnormal tone;Abnormal postural alignment and control;Motor apraxia  Mobility Bed Mobility Rolling Right: 1: +2 Total assist Rolling Right Details: Manual facilitation for weight shifting;Tactile cues for initiation;Verbal cues for sequencing Rolling Left: 1: +2 Total assist Rolling Left Details: Manual facilitation for weight shifting;Tactile cues for initiation;Verbal cues for sequencing Supine to Sit: 1: +2 Total assist (+2) Supine to Sit Details: Manual facilitation for weight  shifting;Tactile cues for initiation;Verbal cues for sequencing Supine to Sit Details (indicate cue type and reason):  (Pt left sitting up in chair at end of session) Transfers Transfers: Yes Stand Pivot Transfers: 1: +2 Total assist (with steady) Stand Pivot Transfer Details: Manual facilitation for weight shifting;Tactile cues for initiation;Verbal cues for sequencing Squat Pivot Transfers: 1: +2 Total assist Squat Pivot Transfer Details: Manual facilitation for weight shifting;Tactile cues for initiation;Verbal cues for sequencing Locomotion  Ambulation Ambulation: No Gait Gait: No Stairs / Additional Locomotion Stairs: No Wheelchair Mobility Wheelchair Mobility: No  Trunk/Postural Assessment  Cervical Assessment Cervical Assessment: Exceptions to [facility] (Forward head) Thoracic Assessment Thoracic Assessment: Exceptions to [facility] (side bend to L due to tone) Lumbar Assessment Lumbar Assessment: Exceptions to [facility] (Posterior pelvic tilt) Postural Control Postural Control: Deficits on evaluation (L lean)  Balance Balance Balance Assessed: Yes Static Sitting Balance Static Sitting - Balance Support: Feet supported;Right upper extremity supported Static Sitting - Level of Assistance: 3: Mod assist Static Sitting - Comment/# of Minutes: Mod/max assist on EOB, on mat table able to demonstrate sitting 15 seconds with min guard and right UE support before loss of balance to left side  Dynamic Sitting Balance Sitting balance - Comments: Static sitting on BSC with STEDY placed in front, pt with strong L lean when fatigued, unable to correct due to tone Static Standing Balance Static Standing - Balance Support: Bilateral upper extremity supported Static Standing - Level of Assistance: 1: +2 Total assist Static Standing - Comment/# of Minutes: in steady  Extremity Assessment  RUE Assessment RUE Assessment: Exceptions to [facility] RUE Strength RUE Overall Strength Comments: Pt can raise UE  against gravity; can grasp items, however, unable to release items RUE Tone RUE Tone: Severe;Hypertonic LUE Assessment LUE Assessment: Exceptions to [facility] LUE Strength LUE Overall Strength Comments: Unable to formally assess d/t tone and spasticity; pt utilizes tone for functional activities  LUE Tone LUE Tone: Severe;Hypertonic LUE Tone Comments: elbow contracture; unable to extend greater than 90 deg  RLE Assessment RLE Assessment: Exceptions to [facility]  RLE Strength RLE Overall Strength Comments: Generalized weakness; assessed functionally  RLE Tone RLE Tone: Severe;Hypertonic RLE Tone Comments: extensor tone  LLE Assessment LLE Assessment: Exceptions to [facility] LLE Strength LLE Overall Strength Comments: Generalized weakness; assessed functionally  LLE Tone LLE Tone: Severe;Hypertonic LLE Tone Comments: flexor synergy    See Function Navigator for Current Functional Status.   Refer to Care Plan for Long Term Goals  Recommendations for other services: Neuropsych and Therapeutic Recreation  Pet therapy and Stress management  Discharge Criteria: Patient will be discharged from PT if patient refuses treatment 3 consecutive times without medical reason, if treatment goals not met, if there is a change in medical status, if patient makes no progress towards goals or if patient is discharged from hospital.  The above assessment, treatment plan, treatment alternatives and goals were discussed and mutually agreed upon: by patient and by family  Gwinda Passe, SPT 10/01/2016, 12:58 PM

## 2016-10-01 NOTE — IPOC Note (Signed)
Overall Plan of Care [facility]) Patient Details Name: Judy Spears MRN: [id number] DOB: Dec 31, 1950  Admitting Diagnosis: fever constipation  Hospital Problems: Principal Problem:   Spastic tetraplegia ([place]) Active Problems:   Encephalitis, arthropod-borne viral   Debility   Urinary retention   PEG (percutaneous endoscopic gastrostomy) status ([place])   Severe muscle deconditioning     Functional Problem List: Nursing    PT Balance, Endurance, Motor, Nutrition, Pain, Safety, Sensory, Skin Integrity  OT Balance, Endurance, Safety, Cognition, Motor, Sensory, Pain  SLP Endurance, Cognition  TR         Basic ADL's: OT Eating, Grooming, Bathing, Dressing, Toileting     Advanced  ADL's: OT       Transfers: PT Bed Mobility, Bed to Chair  OT Toilet     Locomotion: PT       Additional Impairments: OT Fuctional Use of Upper Extremity  SLP Swallowing, Communication, Social Cognition comprehension, expression Social Interaction, Problem Solving, Memory, Attention, Awareness  TR      Anticipated Outcomes Item Anticipated Outcome  Self Feeding Total A/ PEG  Swallowing  Mod A for swallow initiation   Basic self-care  Total A  Toileting  Total A   Bathroom Transfers Max- total A  Bowel/Bladder     Transfers  Max assist   Locomotion  N/A  Communication  Max A for basic response  Cognition  Max A for basic  Pain     Safety/Judgment      Therapy Plan: PT Intensity: Minimum of 1-2 x/day ,45 to 90 minutes PT Frequency: 5 out of 7 days PT Duration Estimated Length of Stay: 16-18 days OT Intensity: Minimum of 1-2 x/day, 45 to 90 minutes OT Frequency: 5 out of 7 days OT Duration/Estimated Length of Stay: 16-18 days SLP Intensity: Minumum of 1-2 x/day, 30 to 90 minutes SLP Frequency: 3 to 5 out of 7 days SLP Duration/Estimated Length of Stay: 2 weeks    Team Interventions: Nursing Interventions    PT interventions Discharge planning, Functional mobility training,  DME/adaptive equipment instruction, Splinting/orthotics, Pain management, Therapeutic Activities, UE/LE Strength taining/ROM, Training and development officer, [employer] reintegration, Neuromuscular re-education, Patient/family education, Skin care/wound management, Therapeutic Exercise, UE/LE Coordination activities, Wheelchair propulsion/positioning, Visual/perceptual remediation/compensation, Disease management/prevention  OT Interventions Training and development officer, Discharge planning, DME/adaptive equipment instruction, Functional mobility training, Neuromuscular re-education, Pain management, Patient/family education, Psychosocial support, Self Care/advanced ADL retraining, Skin care/wound managment, Splinting/orthotics, Therapeutic Activities, Therapeutic Exercise, UE/LE Strength taining/ROM, UE/LE Coordination activities, Wheelchair propulsion/positioning  SLP Interventions Cognitive remediation/compensation, English as a second language teacher, Functional tasks, Patient/family education, Speech/Language facilitation  TR Interventions    SW/CM Interventions Discharge Planning, Psychosocial Support, Patient/Family Education   Barriers to Discharge MD  Medical stability  Nursing      PT New oxygen, Neurogenic Bowel & Bladder, Medical stability, Incontinence, Weight    OT      SLP  (Severity of impairments)    SW       Team Discharge Planning: Destination: PT-Home ,OT- Home , SLP-Home Projected Follow-up: PT-Outpatient PT, 24 hour supervision/assistance, OT-  Outpatient OT, SLP-24 hour supervision/assistance, Outpatient SLP, Home Health SLP Projected Equipment Needs: PT-None recommended by PT, OT-  , SLP-To be determined Equipment Details: PT-Family has all appropriate equimpment at home , OT-Pt has all needed DME Patient/family involved in discharge planning: PT- Patient, Family member/caregiver,  OT-Patient, Family member/caregiver, SLP-Family member/caregiver  MD ELOS: 2 weeks Medical Rehab Prognosis:   Excellent Assessment: The patient has been admitted for CIR therapies with the diagnosis of debility related to  tracheobronchitis, viral encephalitis. The team will be addressing functional mobility, strength, stamina, balance, safety, adaptive techniques and equipment, self-care, bowel and bladder mgt, patient and caregiver education, spasticity mgt, orthotics, trach/respiratory mgt. Goals have been set at max assist across disciplines. Meredith Staggers, MD, FAAPMR      See Team Conference Notes for weekly updates to the plan of care

## 2016-10-01 NOTE — Evaluation (Addendum)
Speech Language Pathology Assessment and Plan  Patient Details  Name: Judy Spears MRN: [id number] Date of Birth: 10/31/1950  SLP Diagnosis: Cognitive Impairments;Speech and Language deficits;Dysphagia;Voice disorder  Rehab Potential: Poor ELOS: 2 weeks    Today's Date: 10/01/2016 SLP Individual Time: 1330-1430 SLP Individual Time Calculation (min): 60 min   Problem List:  Patient Active Problem List   Diagnosis Date Noted  . Severe muscle deconditioning 09/30/2016  . Pressure injury of skin 09/28/2016  . Respiratory failure ([place])   . Tracheobronchitis   . Urinary retention   . PEG (percutaneous endoscopic gastrostomy) status ([place])   . Benign essential HTN   . Hyponatremia   . Hypokalemia   . Reactive hypertension   . Respiratory insufficiency 09/24/2016  . Malignant neoplasm of upper-inner quadrant of left breast in female, estrogen receptor positive ([place]) 07/14/2016  . Heterotopic ossification of bone 03/10/2016  . Acute on chronic respiratory failure with hypoxia ([place])   . Acute hypoxemic respiratory failure ([place])   . Acute blood loss anemia   . Atelectasis 02/04/2016  . Debility 02/03/2016  . Pneumonia of both lower lobes due to Pseudomonas species ([place])   . Pneumonia of both lower lobes due to methicillin susceptible Staphylococcus aureus (MSSA) ([place])   . Acute respiratory failure with hypoxia ([place])   . Acute encephalopathy   . Seizures ([place])   . Sepsis ([place])   . Hypoxia   . Pain   . HCAP (healthcare-associated pneumonia) 12/28/2015  . Chronic respiratory failure ([place]) 12/28/2015  . Acute tracheobronchitis 12/24/2015  . Tracheostomy tube present ([place])   . Tracheal stenosis   . Chronic respiratory failure with hypoxia ([place])   . Spastic tetraplegia ([place]) 10/20/2015  . Tracheostomy status ([place]) 09/26/2015  . Allergic rhinitis 09/26/2015  . 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 Movement disorder 09/22/2015  . Encephalitis 09/22/2015  . Chest pain  02/07/2014  . Premature atrial contractions 01/13/2014  . Abnormality of gait 12/04/2013  . Hyperlipidemia 12/21/2011  . Cardiovascular risk factor 12/21/2011  . Bunion 01/31/2008  . Metatarsalgia of both feet 09/20/2007  . FLAT FOOT 09/20/2007  . HALLUX RIGIDUS, ACQUIRED 09/20/2007   Past Medical History:  Past Medical History:  Diagnosis Date  . Arthritis    lt great toe  . Complication of anesthesia    pt has had headaches post op-did advise to hydra well preop  . Hair loss    right-sided  . History of exercise stress test    a. ETT (12/15) with 12:00 exercise, no ST changes, occasional PACs.   . Hyperlipidemia   . Insomnia   . Migraine headache   . Movement disorder    resltess in left legs  . Postmenopausal   . 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 Premature atrial contractions    a. Holter (12/15) with 8% PACs, no atrial runs or atrial fibrillation.    Past Surgical History:  Past Surgical History:  Procedure Laterality Date  . BREAST BIOPSY  01/01/2011   Procedure: BREAST BIOPSY WITH NEEDLE LOCALIZATION;  Surgeon: Rolm Bookbinder, MD;  Location: [facility];  Service: General;  Laterality: Left;  left breast wire localization biopsy  . BREAST BIOPSY Right 07/27/2012  . BREAST BIOPSY Right 07/07/2016  . BREAST BIOPSY Left 06/30/2016  . cataracts right eye    . CESAREAN SECTION  1986  . HERNIA REPAIR  2000   RI[facility]  . IR [place] TUBE PERCUT W/FLUORO  09/29/2016  . opirectinal membrane peel    .  PEG PLACEMENT    . RHINOPLASTY  1983  . 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 History taken from patient and daughters. Patient known to CIR from prior stay and was involved in  outpatient therapy. She was admitted on 09/24/16 with lethargy. SOB and low grade fevers due to MRSA tracheobronchitis v/s PNA. She was started on IV vancomycin as well as chest PT. Respiratory status improving with treatment and foley placed due to urinary retention. PEG tube evaluated by IVR and was exchanged out on 9/5. Secretions are improving and PCCM recommends chest PT to help with mobilization of secretions. Progressive hyponateremia noted and water flushes Foley discontinued yesterday and has required I/O caths for volumes 400- 600 cc. Patient has had significant decline in mobility and ability to carryout ADL tasks. CIR recommended by rehab team and admited on 09/30/16.   Further chart review and conversation with family and caregiver yield the following information. Pt had tolerated being capped during all waking hours until Thursday 09/24/16 when the cap was removed d/t lethergy and increased secrtions. Pt's trach has been open since that time. Pt was also consuming small amounts of puree and thin liquids with no obvious s/s of distress. Pt was not effective communicator but family able to use context and knowledge of pt to increase communicative ability. According to most recent Outpatient ST notes, pt required Max cues to complete tasks and therapy was placed on hold d/t decreased progress on 08/26/16. Caregiver reports that pt had stoped requesting PO intake during the week of 09/20/16 or slightly before.   PMSV and cognitive linguistic evaluations completed on 10/01/16. Pt currently has permanent #4 cuffles trach and was tolerating trach collar. Pt tolerated finger occlusion without any decline in vitals or evidence of air stacking. PMSV placed and pt tolerated for ~ 45 minutes total without any distress. Despite Total A cues from family, SLP and personal attendent, pt made not attempts to voice. Recommend pt wear PMSV under full supervision during all waking hours. Pt with significant cognitive and  speech-language impairments including arousal, basic communication, ability to perform meaning tasks or interactions. Per MD and PA, pt is on strict NPO until medically cleared. No BSE performed today. Pt requires skilled ST to address the above mentioned deficits, increase ability level and decreased caregiver burden.     Skilled Therapeutic Interventions          Skilled treatment session focused on completion of PMSV and cognitive linguistic evaluations, see above. Pt required Total A to maintain arousal for ~ 30 minutes. PMSV tolerated without any decline in vitals but no attempts to vocalize by pt. Recommend pt wear PMSV under full supervision wile she is awake with husband and personal care attendent signed off on to provide supervision.   SLP Assessment  Patient will need skilled Speech Lanaguage Pathology Services during CIR admission    Recommendations  Patient may use Passy-Muir Speech Valve: During all therapies with supervision;Caregiver trained to provide supervision;During all waking hours (remove during sleep) PMSV Supervision: Full SLP Diet Recommendations: NPO Medication Administration: Via alternative means Oral Care Recommendations: Oral care QID Patient destination: Home Follow up Recommendations: 24 hour supervision/assistance;Outpatient SLP;Home Health SLP Equipment Recommended: To be determined    SLP Frequency 3 to 5 out of  7 days   SLP Duration  SLP Intensity  SLP Treatment/Interventions 2 weeks  Minumum of 1-2 x/day, 30 to 90 minutes  Cognitive remediation/compensation;Cueing hierarchy;Functional tasks;Patient/family education;Speech/Language facilitation    Pain    Prior Functioning Cognitive/Linguistic Baseline: Baseline deficits Baseline deficit details: cognition, speech communication Type of Home: House  Lives With: Spouse Available Help at Discharge: Family;Personal care attendant;Available 24 hours/day Vocation: On  disability  Function:  Eating Eating   Modified Consistency Diet: No Eating Assist Level: Helper performs IV, parenteral or tube feed           Cognition Comprehension Comprehension assist level: Understands basic less than 25% of the time/ requires cueing >75% of the time  Expression   Expression assist level: Expresses basis less than 25% of the time/requires cueing >75% of the time.  Social Interaction Social Interaction assist level: Interacts appropriately less than 25% of the time. May be withdrawn or combative.  Problem Solving Problem solving assist level: Solves basic less than 25% of the time - needs direction nearly all the time or does not effectively solve problems and may need a restraint for safety  Memory Memory assist level: Recognizes or recalls less than 25% of the time/requires cueing greater than 75% of the time   Short Term Goals: Week 1: SLP Short Term Goal 1 (Week 1): Pt will maintain arousal in regards to keeping her eyes open fr ~ 60 seconds with Max A multimodal cues.  SLP Short Term Goal 2 (Week 1): Pt will tolerate PMSV without any decline in vitals with Mod A cues. SLP Short Term Goal 3 (Week 1): Pt will produce vocalizations with Max A multimodal cues.  SLP Short Term Goal 4 (Week 1): Given oral stimulation and Max A cues, pt will initiate a swallow in 50% of opportunities.  SLP Short Term Goal 5 (Week 1): Pt will complete very basic problem solving tasks with Max A cues.   Refer to Care Plan for Long Term Goals  Recommendations for other services: None   Discharge Criteria: Patient will be discharged from SLP if patient refuses treatment 3 consecutive times without medical reason, if treatment goals not met, if there is a change in medical status, if patient makes no progress towards goals or if patient is discharged from hospital.  The above assessment, treatment plan, treatment alternatives and goals were discussed and mutually agreed upon: by  patient and by family  Jadis Mika 10/01/2016, 3:15 PM

## 2016-10-01 NOTE — Progress Notes (Signed)
Patient information reviewed and entered into eRehab system by Lyric Hoar, RN, CRRN, PPS Coordinator.  Information including medical coding and functional independence measure will be reviewed and updated through discharge.     Per nursing patient was given "Data Collection Information Summary for Patients in Inpatient Rehabilitation Facilities with attached "Privacy Act Statement-Health Care Records" upon admission.  

## 2016-10-01 NOTE — Progress Notes (Signed)
Initial Nutrition Assessment  DOCUMENTATION CODES:   Underweight  INTERVENTION:  Increase Osmolite 1.5 formula via PEG by 10 ml every 4 hours to new goal rate of 60 ml/hr x 20 hours (may hold TF for up to 4 hours for therapy) to provide 1800 kcal, 75 grams of protein, and 912 ml of water.  Continue free water flushes of 50 ml q 8 hours.  Once IV fluids are discontinued, recommend increasing free water flushes to 175 ml QID via tube.   NUTRITION DIAGNOSIS:   Inadequate oral intake related to inability to eat as evidenced by NPO status.  GOAL:   Patient will meet greater than or equal to 90% of their needs  MONITOR:   TF tolerance, Skin, Weight trends, Labs, I & O's  REASON FOR ASSESSMENT:   Consult Enteral/tube feeding initiation and management  ASSESSMENT:   66 y.o. 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 She was admitted on 09/24/16 with lethargy. SOB and low grade fevers due to MRSA tracheobronchitis v/s PNA. PEG tube evaluated by IVR and was exchanged out on 9/5. Patient has had significant decline in mobility and ability to carryout ADL tasks. CIR recommended by rehab team.  Pt was unavailable, in therapy, during attempted time of visit. Pt currently has Osmolite 1.5 formula infusing at 40 ml/hr x 20 hours which is providing 1200 kcal (71% of kcal needs), 50 grams of protein  (77% of protein needs). RD to adjust tube feeds to better meet nutrition needs. Per RN, pt has been tolerating her tube feeds with no other difficulties. Per weight records, pt with a 21% weight loss in 9 months.  Unable to complete Nutrition-Focused physical exam at this time. RD to perform physical exam at next visit.   Labs and medications reviewed.   Diet Order:    NPO  Skin:  Wound (see comment) (Stage I to  buttocks)  Last BM:  9/7  Height:   Ht Readings from Last 1 Encounters:  09/30/16 5\' 2"  (1.575 m)    Weight:   Wt Readings from Last 1 Encounters:  09/30/16 92 lb 9.6 oz (42 kg)    Ideal Body Weight:  50 kg  BMI:  Body mass index is 16.94 kg/m.  Estimated Nutritional Needs:   Kcal:  1700-1900  Protein:  65-75 gram  Fluid:  1.7-1.9 L/day  EDUCATION NEEDS:   No education needs identified at this time  Corrin Parker, MS, RD, LDN Pager # [phone] After hours/ weekend pager # [phone]

## 2016-10-01 NOTE — Progress Notes (Signed)
Physical Therapy Session Note  Patient Details  Name: Judy Spears MRN: [id number] Date of Birth: 02/04/1950  Today's Date: 10/01/2016 PT Individual Time: 6808-8110 PT Individual Time Calculation (min): 48 min    Skilled Therapeutic Interventions/Progress Updates: Pt received supine in bed, no evidence of pain and agreeable to treatment. Supine>sit with HOB elevated and maxA, pt initiating use of UEs to reach for bedrails. Sit >stand maxA +1 in stedy; totalA for donning brief in standing. Sitting in stedy seat pt demonstrates significant L lateral lean, weight shift to R side with hips and L trunk overactivity; adjusted to midline with totalA. Transfer to w/c totalA in stedy. Once in w/c pt with significant dyskinesias/tremors RLE in extension pattern, unable to safely rest LE on leg rest. Required several minutes, cues for deep breathing, and ultimately w/c sat upright and deep pressure placed through B knees successfully reducing tremors. Remained seated upright in w/c with caregiver [name] present; vitals WNL on 6L O2 via trach collar.      Therapy Documentation Precautions:  Precautions Precautions: Fall Restrictions Weight Bearing Restrictions: No General: Chart Reviewed: Yes Additional Pertinent History: PMH of [place] encephalitis with complicated course, PEG/trach dependent, recent diagnosis of breast cancer s/p lumpectomy.  Vital Signs: Therapy Vitals Pulse Rate: 93 Resp: (!) 22 Patient Position (if appropriate): Lying Oxygen Therapy SpO2: 97 % O2 Device: Tracheostomy Collar O2 Flow Rate (L/min): 6 L/min FiO2 (%): 28 %   See Function Navigator for Current Functional Status.   Therapy/Group: Individual Therapy  Luberta Mutter 10/01/2016, 3:51 PM

## 2016-10-01 NOTE — Progress Notes (Signed)
Respiratory Care Note: Trach Management  All equipment at bedside for trach care and suctioning.  Ambu bag and additional trach also at bedside.  No education needed at this time.

## 2016-10-02 LAB — BASIC METABOLIC PANEL
ANION GAP: 9 (ref 5–15)
BUN: 14 mg/dL (ref 6–20)
CALCIUM: 8.9 mg/dL (ref 8.9–10.3)
CO2: 32 mmol/L (ref 22–32)
Chloride: 95 mmol/L — ABNORMAL LOW (ref 101–111)
Creatinine, Ser: 0.3 mg/dL — ABNORMAL LOW (ref 0.44–1.00)
GFR calc Af Amer: >60 mL/min (ref >60)
Glucose, Bld: 108 mg/dL — ABNORMAL HIGH (ref 65–99)
POTASSIUM: 3.2 mmol/L — AB (ref 3.5–5.1)
SODIUM: 136 mmol/L (ref 135–145)

## 2016-10-02 LAB — GLUCOSE, CAPILLARY
GLUCOSE-CAPILLARY: 158 mg/dL — AB (ref 65–99)
GLUCOSE-CAPILLARY: 162 mg/dL — AB (ref 65–99)
Glucose-Capillary: 130 mg/dL — ABNORMAL HIGH (ref 65–99)
Glucose-Capillary: 106 mg/dL — ABNORMAL HIGH (ref 65–99)
Glucose-Capillary: 111 mg/dL — ABNORMAL HIGH (ref 65–99)
Glucose-Capillary: 108 mg/dL — ABNORMAL HIGH (ref 65–99)

## 2016-10-02 MED ORDER — POTASSIUM CHLORIDE 20 MEQ PO PACK
40.0000 meq | PACK | Freq: Every day | ORAL | Status: DC
  Administered 2016-10-03: 40 meq via ORAL
  Filled 2016-10-02: qty 2

## 2016-10-02 NOTE — Progress Notes (Signed)
Patient ID: Judy Spears, female   DOB: 29-Nov-1950, 66 y.o.   MRN: [id number]   10/02/16.  Judy Spears is a 66 y.o. female  Admit for CIR with spastic tetraplegia due to hx of viral encephalitis with deconditioningsecondary to tracheobronchitis  Past Medical History:  Diagnosis Date  . Arthritis    lt great toe  . Complication of anesthesia    pt has had headaches post op-did advise to hydra well preop  . Hair loss    right-sided  . History of exercise stress test    a. ETT (12/15) with 12:00 exercise, no ST changes, occasional PACs.   . Hyperlipidemia   . Insomnia   . Migraine headache   . Movement disorder    resltess in left legs  . Postmenopausal   . 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 Premature atrial contractions    a. Holter (12/15) with 8% PACs, no atrial runs or atrial fibrillation.     Subjective: Nursing staff report no new issues.   Objective: Vital signs in last 24 hours: Temp:  [97.5 F (36.4 C)-98.4 F (36.9 C)] 98.4 F (36.9 C) (09/08 0507) Pulse Rate:  [92-95] 92 (09/08 0507) Resp:  [18-22] 18 (09/08 0507) BP: (113-121)/(74-98) 121/98 (09/08 0507) SpO2:  [97 %-99 %] 98 % (09/08 0507) FiO2 (%):  [28 %] 28 % (09/08 0450) Weight change:  Last BM Date: 10/01/16  Intake/Output from previous day: 09/07 0701 - 09/08 0700 In: -  Out: 1200 [Urine:1200] Last cbgs: CBG (last 3)   Recent Labs  10/02/16 0028 10/02/16 0643 10/02/16 0854  GLUCAP 162* 158* 130*     Physical Exam General: No apparent distress  Weak frail HEENT: s/p trach Lungs: Normal effort. Lungs clear to auscultation, no crackles or wheezes. Cardiovascular: Regular rate and rhythm, no edema Abdomen: S/NT/ND; BS(+) s/p PEG Musculoskeletal:  unchanged Neurological: No new neurological deficits; follows simple commands Wounds: few abrasions   Mental state: flat affect, follows simple commands    Lab Results: BMET    Component Value Date/Time   NA 136 10/02/2016 0558   NA 134  09/08/2016 1245   K 3.2 (L) 10/02/2016 0558   CL 95 (L) 10/02/2016 0558   CO2 32 10/02/2016 0558   GLUCOSE 108 (H) 10/02/2016 0558   BUN 14 10/02/2016 0558   BUN 10 09/08/2016 1245   CREATININE 0.30 (L) 10/02/2016 0558   CALCIUM 8.9 10/02/2016 0558   GFRNONAA >60 10/02/2016 0558   GFRAA >60 10/02/2016 0558   CBC    Component Value Date/Time   WBC 7.5 10/01/2016 0639   RBC 5.02 10/01/2016 0639   HGB 14.4 10/01/2016 0639   HGB 13.1 09/08/2016 1245   HCT 42.8 10/01/2016 0639   HCT 40.7 09/08/2016 1245   PLT 412 (H) 10/01/2016 0639   PLT 363 09/08/2016 1245   MCV 85.3 10/01/2016 0639   MCV 90 09/08/2016 1245   MCH 28.7 10/01/2016 0639   MCHC 33.6 10/01/2016 0639   RDW 13.0 10/01/2016 0639   RDW 13.5 09/08/2016 1245   LYMPHSABS 1.2 10/01/2016 0639   MONOABS 0.9 10/01/2016 0639   EOSABS 0.2 10/01/2016 0639   BASOSABS 0.0 10/01/2016 0639   BP Readings from Last 3 Encounters:  10/02/16 (!) 121/98  09/30/16 119/74  09/21/16 132/70  Medications: I have reviewed the patient's current medications.  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 Continue Vanco  HTN. Slightly labile. Will continue to observe on present therapy    Length of stay, days: 2  Nyoka Cowden , MD 10/02/2016, 9:22 AM

## 2016-10-03 ENCOUNTER — Inpatient Hospital Stay (HOSPITAL_COMMUNITY): Payer: Medicare Other

## 2016-10-03 ENCOUNTER — Inpatient Hospital Stay (HOSPITAL_COMMUNITY): Payer: Medicare Other | Admitting: Occupational Therapy

## 2016-10-03 LAB — BASIC METABOLIC PANEL
ANION GAP: 11 (ref 5–15)
BUN: 5 mg/dL — ABNORMAL LOW (ref 6–20)
CALCIUM: 9.2 mg/dL (ref 8.9–10.3)
CO2: 29 mmol/L (ref 22–32)
Chloride: 95 mmol/L — ABNORMAL LOW (ref 101–111)
Creatinine, Ser: <0.3 mg/dL — ABNORMAL LOW (ref 0.44–1.00)
GLUCOSE: 104 mg/dL — AB (ref 65–99)
POTASSIUM: 3.1 mmol/L — AB (ref 3.5–5.1)
Sodium: 135 mmol/L (ref 135–145)

## 2016-10-03 LAB — GLUCOSE, CAPILLARY
GLUCOSE-CAPILLARY: 98 mg/dL (ref 65–99)
GLUCOSE-CAPILLARY: 91 mg/dL (ref 65–99)
GLUCOSE-CAPILLARY: 158 mg/dL — AB (ref 65–99)
Glucose-Capillary: 122 mg/dL — ABNORMAL HIGH (ref 65–99)
Glucose-Capillary: 115 mg/dL — ABNORMAL HIGH (ref 65–99)
Glucose-Capillary: 98 mg/dL (ref 65–99)

## 2016-10-03 MED ORDER — INSULIN ASPART 100 UNIT/ML ~~LOC~~ SOLN: 0.0000 [IU] | SUBCUTANEOUS | Status: DC

## 2016-10-03 MED ORDER — POTASSIUM CHLORIDE 20 MEQ PO PACK: 40.0000 meq | PACK | Freq: Two times a day (BID) | ORAL | Status: DC

## 2016-10-03 MED ORDER — INSULIN ASPART 100 UNIT/ML ~~LOC~~ SOLN
0.0000 [IU] | Freq: Four times a day (QID) | SUBCUTANEOUS | Status: DC
  Administered 2016-10-04 – 2016-10-10 (×16): 2 [IU] via SUBCUTANEOUS
  Administered 2016-10-11: 3 [IU] via SUBCUTANEOUS
  Administered 2016-10-11 (×2): 2 [IU] via SUBCUTANEOUS
  Administered 2016-10-12: 3 [IU] via SUBCUTANEOUS
  Administered 2016-10-12 – 2016-10-15 (×6): 2 [IU] via SUBCUTANEOUS
  Administered 2016-10-16: 3 [IU] via SUBCUTANEOUS
  Administered 2016-10-16: 2 [IU] via SUBCUTANEOUS
  Administered 2016-10-17: 3 [IU] via SUBCUTANEOUS
  Administered 2016-10-17 – 2016-10-26 (×18): 2 [IU] via SUBCUTANEOUS
  Administered 2016-10-26: 3 [IU] via SUBCUTANEOUS
  Administered 2016-10-26 – 2016-10-27 (×2): 2 [IU] via SUBCUTANEOUS

## 2016-10-03 MED ORDER — INSULIN ASPART 100 UNIT/ML ~~LOC~~ SOLN: 0.0000 [IU] | Freq: Four times a day (QID) | SUBCUTANEOUS | Status: DC

## 2016-10-03 MED ORDER — POTASSIUM CHLORIDE 20 MEQ PO PACK
40.0000 meq | PACK | Freq: Two times a day (BID) | ORAL | Status: DC
  Administered 2016-10-03 – 2016-10-06 (×8): 40 meq via ORAL
  Filled 2016-10-03 (×9): qty 2

## 2016-10-03 NOTE — Progress Notes (Signed)
Physical Therapy Session Note  Patient Details  Name: Judy Spears MRN: [id number] Date of Birth: September 14, 1950  Today's Date: 10/03/2016 PT Individual Time: 1430-1528 PT Individual Time Calculation (min): 58 min   Short Term Goals: Week 1:  PT Short Term Goal 1 (Week 1): Pt will roll with max assist +2 PT Short Term Goal 2 (Week 1): Pt will supine>sit with max assist +2 PT Short Term Goal 3 (Week 1): Pt will sit<>stand with max assist +2  PT Short Term Goal 4 (Week 1): Pt will tolerate sitting up in chair for 4 hrs a day PT Short Term Goal 5 (Week 1): Pt will sit with UE support for 1 min with min guard   Skilled Therapeutic Interventions/Progress Updates:    Pt supine in bed upon PT arrival, agreeable to therapy tx and denies pain. Pt transferred from supine to sitting with total assist +2, tactile cues to reach for bedrails. Pt requiring mod assist to maintain sitting balance at EOB secondary to L lateral lean. Pt transferred from EOB to w/c via stedy, total assist +2. Pt requiring deep pressure through LEs to prevent tremors during transfers. Pt transported to gym in [place] w/c total assist. Pt transferred from w/c<>mat using a slideboard with total assist + 2, manual facilitation for weightshifting and lateral scooting. Pt seated edge of mat for 20 minutes with mod assist to maintain seated balance secondary to L lateral lean, pts LEs supported on step and deep pressure applied to minimize LE tremors. While sitting edge of mat, PT performed facilitation for trunk extension and shoulder blade retraction to promote better posture and stretch pectoral musculature. Pt also performed x 3 sit<>R sidelying on elbow for weightbearing through R UE and to encourage R lean, max assist to move into position and come back up to midline. Pt transferred back to w/c using slideboard and total assist for placement, weightshifting and lateral scooting. Pt left in TIS w/c with husband present in the gym for hand off to  OT.   Therapy Documentation Precautions:  Precautions Precautions: Fall Precaution Comments: Trach, PEG Restrictions Weight Bearing Restrictions: No   See Function Navigator for Current Functional Status.   Therapy/Group: Individual Therapy  Netta Corrigan, PT, DPT 10/03/2016, 5:07 PM

## 2016-10-03 NOTE — Progress Notes (Signed)
Occupational Therapy Session Note  Patient Details  Name: Judy Spears MRN: [id number] Date of Birth: 10/03/1950  Today's Date: 10/03/2016 OT Individual Time: 8937-3428 OT Individual Time Calculation (min): 60 min    Short Term Goals: Week 1:  OT Short Term Goal 1 (Week 1): Pt will complete STEDY transfer with max A +1 in order to decrease caregiver burden OT Short Term Goal 2 (Week 1): Pt will complete sliding board transfer to therapy mat with +2 assist in prep for functional transfer OT Short Term Goal 3 (Week 1): Pt will maintain static sitting balance with feet supported with mod A in prep for functional sitting on BSC. OT Short Term Goal 4 (Week 1): Pt will tolerate UE PROM HEP performed by caregivers  Skilled Therapeutic Interventions/Progress Updates:    1:1. Pt received in tx gym with husband sitting in TIS. Removed leg rest from w/c in prep for transfer and pt spasms in BLE increased requiring deep pressure through B knees and deep breathing for spasms to subside. Pt slide board transfers wth total A of 1 with manual facilitation of weight shift forward and tactile cues for head hip relationship with 6" step under feet and yoga block between legs to maintain positioning. OT provides MOD A increasing to MAX A  For sitting balance and assisted pt with weight shifting laterally and anterior posterior. Pt able to momentarily maintain midline after practicing weight shifting with MIN A, however with fatigue leans L.  Pt able to initiate weight shift laterally and maintain R lean with MOD A. Pt slide board transfer back to w/c and OT propels w/c back to room. Pt completes 5 sit to stands in stedy with +2 lifting assistance and manual facilitation to weight shift L. Pt able to initiate grabbing onto stedy bars for transfer with [facility] A to reach forward. Pt demonstrating L flexor spasms in LLE requiring facilitation to floor and deep pressure. Pt transfers stedy>EOB>supine HOB elevated with total A +2.  Nursing entering to cath and OT printed and instructed husband in PROM HEP. Husband verbalizing being familiar with stretches and having similar program at home. Exited session with pt supine in bed with NT in room and husband present.   Therapy Documentation Precautions:  Precautions Precautions: Fall Precaution Comments: Trach, PEG Restrictions Weight Bearing Restrictions: No  See Function Navigator for Current Functional Status.   Therapy/Group: Individual Therapy  Tonny Branch 10/03/2016, 4:49 PM

## 2016-10-03 NOTE — Progress Notes (Signed)
Pharmacy Antibiotic Note  Judy Spears is a 66 y.o. female admitted on 09/30/2016 with MRSA PNA.  Pharmacy has been consulted for vancomycin dosing.  Continues on day #10 of vancomycin for MRSA tracheobronchitis. No recommendation on LOT yet. Has not had therapeutic level yet. Last dose of vanc was earlier today at 0810. Afebrile, WBC wnl. Last vancomycin trough was 6 on 9/4.   Plan: Continue vancomycin 750mg  IV Q8H Vancomycin random level at 1530 Monitor clinical picture, renal function, VT prn F/U C&S, abx deescalation / LOT  Consider 10-14 days of vancomycin or consider switching to doxycycline to finish up course. Seems like allergy may be due to photosensitivity and not true allergy  Height: 5\' 2"  (157.5 cm) (per husband ) Weight: 90 lb 14.4 oz (41.2 kg) IBW/kg (Calculated) : 50.1  Temp (24hrs), Avg:98.2 F (36.8 C), Min:98.2 F (36.8 C), Max:98.2 F (36.8 C)   Recent Labs Lab 09/28/16 0925 09/29/16 0533 09/30/16 0955 10/01/16 0639 10/02/16 0558 10/03/16 0731  WBC  --  7.4  --  7.5  --   --   CREATININE <0.30* <0.30* 0.32* <0.30* 0.30* <0.30*  VANCOTROUGH 6*  --   --   --   --   --     CrCl cannot be calculated (This lab value cannot be used to calculate CrCl because it is not a number: <0.30).    Allergies  Allergen Reactions  . Cefepime Other (See Comments)    Status epilepticus- this reaction is questionable per husband.States that it was determined that pt did not have seizure activity. Status epilepticus  . Tetracyclines & Related Photosensitivity and Other (See Comments)    Blisters  . Doxycycline Monohydrate     Blisters, photosensitivity- family state that the reaction is actually to tetracycline.  . Pollen Extract Other (See Comments)    Running nose  . Tetracycline Other (See Comments) and Photosensitivity    Blisters as a child.    Thank you for allowing pharmacy to be a part of this patient's care.  Theseus Birnie 10/03/2016 9:45 AM

## 2016-10-03 NOTE — Progress Notes (Signed)
Occupational Therapy Session Note  Patient Details  Name: Judy Spears MRN: [id number] Date of Birth: 1950/12/15  Today's Date: 10/03/2016 OT Individual Time: 1000-1100 OT Individual Time Calculation (min): 60 min    Short Term Goals: Week 1:  OT Short Term Goal 1 (Week 1): Pt will complete STEDY transfer with max A +1 in order to decrease caregiver burden OT Short Term Goal 2 (Week 1): Pt will complete sliding board transfer to therapy mat with +2 assist in prep for functional transfer OT Short Term Goal 3 (Week 1): Pt will maintain static sitting balance with feet supported with mod A in prep for functional sitting on BSC. OT Short Term Goal 4 (Week 1): Pt will tolerate UE PROM HEP performed by caregivers  Skilled Therapeutic Interventions/Progress Updates:    Pt seen for OT session focusing on functional mobility and activity tolerance. Pt in supine upon arrival with husband present, nodding in agreement to tx session. Pants donned total A +2 in supine, rolling with max A to pull pants up. She transferred to EOB with +2 assist, initiating reaching for bed rails and maintaining firm grasp on rail once positioned. Seated EOB, she donned shirt with total A with +2 in order to maintain sitting balance due to strong L lean.  She stood in [employer] with +2 assist for transfer to w/c. In therapy gym, pt completed sliding board transfer therapy mat> w/c transferring to her R. Placed block under pt's feet for LE stability and yoga block placed btwn feet in order to maintain positioning due to pt's flexor tone and spasms. Transfer completed with total A with +2 for safety.  Pt required mod progressing to max A sitting balance on EOM due to L lean which increased with fatigue. Pt returned to room and RN requesting pt return to bed for cathing. Completed sliding board transfer to pt's L  In same manner as described above.  Pt left in supine at end of session, husband present and RN made aware of pt's  position. Pt's O2 levels at 92% throughout session on 5L supplemental O2.  Discussed with pt's husband POC, OT goals, and d/c planning. Husband in agreement with POC.   Therapy Documentation Precautions:  Precautions Precautions: Fall Precaution Comments: Trach, PEG Restrictions Weight Bearing Restrictions: No Pain: Pain Assessment Faces Pain Scale: No Pain  See Function Navigator for Current Functional Status.   Therapy/Group: Individual Therapy  [name], Thecla Forgione C 10/03/2016, 6:52 AM

## 2016-10-03 NOTE — Progress Notes (Signed)
Patient ID: Judy Spears, female   DOB: [birth year], 66 y.o.   MRN: [id number]   10/03/16.  Judy Spears is a 66 y.o. 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 Arthritis    lt great toe  . Complication of anesthesia    pt has had headaches post op-did advise to hydra well preop  . Hair loss    right-sided  . History of exercise stress test    a. ETT (12/15) with 12:00 exercise, no ST changes, occasional PACs.   . Hyperlipidemia   . Insomnia   . Migraine headache   . Movement disorder    resltess in left legs  . Postmenopausal   . 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 Premature atrial contractions    a. Holter (12/15) with 8% PACs, no atrial runs or atrial fibrillation.      Subjective: Staff reports no new problems or issues  Objective: Vital signs in last 24 hours: Temp:  [98.2 F (36.8 C)] 98.2 F (36.8 C) (09/09 0416) Pulse Rate:  [82-92] 92 (09/09 0416) Resp:  [16-18] 17 (09/09 0416) BP: (133-143)/(80-90) 143/90 (09/09 0416) SpO2:  [92 %-97 %] 93 % (09/09 0914) FiO2 (%):  [28 %-40 %] 40 % (09/09 0914) Weight:  [90 lb 14.4 oz (41.2 kg)] 90 lb 14.4 oz (41.2 kg) (09/09 0800) Weight change:  Last BM Date: 10/01/16  Intake/Output from previous day: 09/08 0701 - 09/09 0700 In: -  Out: 450 [Urine:450] Last cbgs: CBG (last 3)   Recent Labs  10/02/16 2358 10/03/16 0452 10/03/16 0755  GLUCAP 122* 115* 98   BP Readings from Last 3 Encounters:  10/03/16 (!) 143/90  09/30/16 119/74  09/21/16 132/70    Physical Exam General: No apparent distress  Frail, weak, minimal response HEENT: s/p trach Lungs: Normal effort. Lungs clear to auscultation anterolaterally Cardiovascular: Regular rate and rhythm, no edema Abdomen: S/NT/ND; BS(+) Musculoskeletal:  unchanged Neurological: No new neurological deficits with spastic L HP Wounds: few abrasions     Mental state: minimal response    Lab Results: BMET    Component Value Date/Time   NA 135 10/03/2016 0731   NA 134 09/08/2016 1245   K 3.1 (L) 10/03/2016 0731   CL 95 (L) 10/03/2016 0731   CO2 29 10/03/2016 0731   GLUCOSE 104 (H) 10/03/2016 0731   BUN 5 (L) 10/03/2016 0731   BUN 10 09/08/2016 1245   CREATININE <0.30 (L) 10/03/2016 0731   CALCIUM 9.2 10/03/2016 0731   GFRNONAA NOT CALCULATED 10/03/2016 0731   GFRAA NOT CALCULATED 10/03/2016 0731   CBC    Component Value Date/Time   WBC 7.5 10/01/2016 0639   RBC 5.02 10/01/2016 0639   HGB 14.4 10/01/2016 0639   HGB 13.1 09/08/2016 1245   HCT 42.8 10/01/2016 0639   HCT 40.7 09/08/2016 1245   PLT 412 (H) 10/01/2016 0639   PLT 363 09/08/2016 1245   MCV 85.3 10/01/2016 0639   MCV 90 09/08/2016 1245   MCH 28.7 10/01/2016 0639   MCHC 33.6 10/01/2016 0639   RDW 13.0 10/01/2016 0639   RDW 13.5 09/08/2016 1245   LYMPHSABS 1.2 10/01/2016 0639   MONOABS 0.9 10/01/2016 0639   EOSABS 0.2 10/01/2016 0639   BASOSABS 0.0 10/01/2016 0639    Medications: I have reviewed the patient's current medications.  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 Continue Vanco HTN. Slightly labile. Will continue to observe on present therapy    Length of stay, days: [place] , MD 10/03/2016, 9:17 AM

## 2016-10-04 ENCOUNTER — Inpatient Hospital Stay (HOSPITAL_COMMUNITY): Payer: Medicare Other | Admitting: Physical Therapy

## 2016-10-04 ENCOUNTER — Inpatient Hospital Stay (HOSPITAL_COMMUNITY): Payer: Medicare Other | Admitting: Speech Pathology

## 2016-10-04 ENCOUNTER — Inpatient Hospital Stay (HOSPITAL_COMMUNITY): Payer: Medicare Other | Admitting: Occupational Therapy

## 2016-10-04 ENCOUNTER — Ambulatory Visit: Payer: Medicare Other | Admitting: Physical Therapy

## 2016-10-04 ENCOUNTER — Inpatient Hospital Stay (HOSPITAL_COMMUNITY): Payer: Medicare Other

## 2016-10-04 ENCOUNTER — Ambulatory Visit: Payer: Medicare Other | Admitting: Occupational Therapy

## 2016-10-04 DIAGNOSIS — J40 Bronchitis, not specified as acute or chronic: Secondary | ICD-10-CM

## 2016-10-04 LAB — GLUCOSE, CAPILLARY
GLUCOSE-CAPILLARY: 120 mg/dL — AB (ref 65–99)
GLUCOSE-CAPILLARY: 124 mg/dL — AB (ref 65–99)
GLUCOSE-CAPILLARY: 95 mg/dL (ref 65–99)
Glucose-Capillary: 123 mg/dL — ABNORMAL HIGH (ref 65–99)
Glucose-Capillary: 142 mg/dL — ABNORMAL HIGH (ref 65–99)

## 2016-10-04 LAB — BASIC METABOLIC PANEL
Anion gap: 8 (ref 5–15)
BUN: 9 mg/dL (ref 6–20)
CHLORIDE: 97 mmol/L — AB (ref 101–111)
CO2: 30 mmol/L (ref 22–32)
CREATININE: 0.37 mg/dL — AB (ref 0.44–1.00)
Calcium: 9 mg/dL (ref 8.9–10.3)
GFR calc Af Amer: >60 mL/min (ref >60)
GFR calc non Af Amer: >60 mL/min (ref >60)
Glucose, Bld: 128 mg/dL — ABNORMAL HIGH (ref 65–99)
Potassium: 3.9 mmol/L (ref 3.5–5.1)
SODIUM: 135 mmol/L (ref 135–145)

## 2016-10-04 LAB — VANCOMYCIN, TROUGH: VANCOMYCIN TR: 18 ug/mL (ref 15–20)

## 2016-10-04 MED ORDER — AMANTADINE HCL 50 MG/5ML PO SYRP
100.0000 mg | ORAL_SOLUTION | Freq: Two times a day (BID) | ORAL | Status: DC
  Administered 2016-10-05 – 2016-10-27 (×44): 100 mg
  Filled 2016-10-04 (×48): qty 10

## 2016-10-04 MED ORDER — DIAZEPAM 2 MG PO TABS
1.0000 mg | ORAL_TABLET | Freq: Every day | ORAL | Status: DC
  Administered 2016-10-04 – 2016-10-10 (×7): 1 mg
  Filled 2016-10-04 (×7): qty 1

## 2016-10-04 MED ORDER — CLOTRIMAZOLE 1 % EX CREA
TOPICAL_CREAM | Freq: Two times a day (BID) | CUTANEOUS | Status: DC
  Administered 2016-10-04 – 2016-10-27 (×46): via TOPICAL
  Filled 2016-10-04 (×3): qty 15

## 2016-10-04 MED ORDER — SULFAMETHOXAZOLE-TRIMETHOPRIM 200-40 MG/5ML PO SUSP
20.0000 mL | Freq: Two times a day (BID) | ORAL | Status: AC
  Administered 2016-10-04 – 2016-10-07 (×7): 20 mL
  Filled 2016-10-04 (×7): qty 20

## 2016-10-04 MED ORDER — FREE WATER
175.0000 mL | Freq: Three times a day (TID) | Status: DC
  Administered 2016-10-04 – 2016-10-05 (×7): 175 mL

## 2016-10-04 NOTE — Care Management Note (Signed)
[facility] Individual Statement of Services  Patient Name:  BRITA JURGENSEN  Date:  10/04/2016  Welcome to the [facility].  Our goal is to provide you with an individualized program based on your diagnosis and situation, designed to meet your specific needs.  With this comprehensive rehabilitation program, you will be expected to participate in at least 3 hours of rehabilitation therapies Monday-Friday, with modified therapy programming on the weekends.  Your rehabilitation program will include the following services:  Physical Therapy (PT), Occupational Therapy (OT), Speech Therapy (ST), 24 hour per day rehabilitation nursing, Therapeutic Recreaction (TR), Case Management (Social Worker), Rehabilitation Medicine, Nutrition Services and Pharmacy Services  Weekly team conferences will be held on Tuesdays to discuss your progress.  Your Social Worker will talk with you frequently to get your input and to update you on team discussions.  Team conferences with you and your family in attendance may also be held.  Expected length of stay: 16-18 days   Overall anticipated outcome: maximum assistance  Depending on your progress and recovery, your program may change. Your Social Worker will coordinate services and will keep you informed of any changes. Your Social Worker's name and contact numbers are listed  below.  The following services may also be recommended but are not provided by the [facility] will be made to provide these services after discharge if needed.  Arrangements include referral to agencies that provide these services.  Your insurance has been verified to be:  [facility] Medicare Your primary doctor is:  Cari Caraway  Pertinent information will be shared with your doctor and your insurance company.  Social Worker:   [place], [place] or ([phone]   Information discussed with and copy given to patient by: Lennart Pall, 10/04/2016, 2:34 PM

## 2016-10-04 NOTE — Progress Notes (Signed)
Pharmacy Antibiotic Note  Judy Spears is a 66 y.o. female admitted on 09/30/2016 with MRSA PNA.  Pharmacy  consulted for vancomycin dosing.  Continues on day #10 of vancomycin for MRSA tracheobronchitis. No recommendation on LOT yet Afebrile, WBC wnl. Vancomycin trough= 18 mcg/ml today, therapeutic level. Goal VT = 15-20 mcg/ml.  Plan: Continue vancomycin 750mg  IV Q8H Monitor clinical picture, renal function, VT prn F/U C&S, abx deescalation / LOT  Consider 10-14 days of vancomycin or consider switching to doxycycline to finish up course. Seems like allergy may be due to photosensitivity and not true allergy  Height: 5\' 2"  (157.5 cm) (per husband ) Weight: 92 lb (41.7 kg) IBW/kg (Calculated) : 50.1  Temp (24hrs), Avg:98.2 F (36.8 C), Min:98.2 F (36.8 C), Max:98.2 F (36.8 C)   Recent Labs Lab 09/28/16 0925 09/29/16 0533 09/30/16 0955 10/01/16 0639 10/02/16 0558 10/03/16 0731 10/04/16 0707  WBC  --  7.4  --  7.5  --   --   --   CREATININE <0.30* <0.30* 0.32* <0.30* 0.30* <0.30* 0.37*  VANCOTROUGH 6*  --   --   --   --   --  18    Estimated Creatinine Clearance: 46.2 mL/min (A) (by C-G formula based on SCr of 0.37 mg/dL (L)).    Allergies  Allergen Reactions  . Cefepime Other (See Comments)    Status epilepticus- this reaction is questionable per husband.States that it was determined that pt did not have seizure activity. Status epilepticus  . Tetracyclines & Related Photosensitivity and Other (See Comments)    Blisters  . Doxycycline Monohydrate     Blisters, photosensitivity- family state that the reaction is actually to tetracycline.  . Pollen Extract Other (See Comments)    Running nose  . Tetracycline Other (See Comments) and Photosensitivity    Blisters as a child.    Thank you for allowing pharmacy to be a part of this patient's care. Nicole Cella, RPh Clinical Pharmacist Pager: [phone] 10/04/2016 12:59 PM

## 2016-10-04 NOTE — Progress Notes (Signed)
Physical Therapy Session Note  Patient Details  Name: Judy Spears MRN: [id number] Date of Birth: 09-27-1950  Today's Date: 10/04/2016 PT Individual Time: 1000-1115 and 1500-1530 PT Individual Time Calculation (min): 75 min and 30 min (total 105 min)   Short Term Goals: Week 1:  PT Short Term Goal 1 (Week 1): Pt will roll with max assist +2 PT Short Term Goal 2 (Week 1): Pt will supine>sit with max assist +2 PT Short Term Goal 3 (Week 1): Pt will sit<>stand with max assist +2  PT Short Term Goal 4 (Week 1): Pt will tolerate sitting up in chair for 4 hrs a day PT Short Term Goal 5 (Week 1): Pt will sit with UE support for 1 min with min guard   Skilled Therapeutic Interventions/Progress Updates: Tx 1: Pt received seated in w/c with caregiver [name] present; no evidence of pain and agreeable to treatment. Pt demo's increased use and initiation in BUEs to unlock w/c seatbelt, reach for stedy handles, etc however still requires hand-over-hand guidance d/t spasticity and limited ROM in L elbow, R shoulder limiting functional reaching abilities. Sit>stand in stedy with maxA +2. Sitting balance on edge of mat table maxA initially; performed repetitive R lateral weight shift onto R elbow for L trunk lengthening/R trunk shortening to facilitate midline postural alignment. R wedge also placed under R hip to elevate pelvis to neutral, facilitate trunk length imbalances. Following stretching and with use of wedge, RUE on table pt able to demo S static sitting balance >30sec. When R hand removed from mat table, pt has immediate LOB to L side. Straddled on low bench, performed anterior weight shifts to facilitate adductor stretching, anterior pelvic tilt; sit <>stand x3 trials while straddled on bench; initially totalA +2 however reduced to mod/max +2 when pt's UEs placed on therapists shoulder in front of her. Pt O2 inconsistently de-sats throughout session while on 6L O2 via trach collar; usually recovers to WNL  with cueing for deep breathing, however one episode where O2 did not return to WNL within 1-2 min. PMSV removed and RN alerted; following several minutes of cueing for breathing O2 returned to WNL. Returned to w/c with totalA squat pivot. Remained semi-reclined in w/c at end of session, all needs in reach.   Tx 2: Pt received supine in bed; evidence of pain throughout session with LEs moved however pt unable to verbalize pain location/description. RN present to perform oral/trach suction d/t increased secretions. Attempted BLE PROM however pt vocalizing in pain with minimal movement and pt returned to resting position. Offered to get pt into chair and pt declined by shaking head no, although caregiver reports she seemed much more comfortable in w/c earlier. Performed minimal BUE PROM for elbow flexors, wrist flexors, however pt continues with poor tolerance of activity. O2 labile throughout session, desat to 78% at lowest and requires cues for deep breathing to return to WNL. Pt also felt warm however unable to obtain temperature either orally or axillary. Attempted to reposition LLE onto a pillow to reduce hip ER and promote normalized alignment, however pt again vocalizing pain with LLE movement and returned to natural resting position. Pt asked if she would like to rest and pt clearly nods head yes. Alerted RN to caregiver's request for respiratory therapy to perform deep suction, need for temperature check, and decreased participation with increased vocalizations of pain throughout session. Remained supine in bed, caregiver present and all needs in reach.      Therapy Documentation Precautions:  Precautions Precautions: Fall Precaution Comments: Trach, PEG Restrictions Weight Bearing Restrictions: No Vital Signs: Therapy Vitals Pulse Rate: (!) 121 Resp: (!) 26 Patient Position (if appropriate): Sitting Oxygen Therapy SpO2: 96 % O2 Device: Tracheostomy Collar O2 Flow Rate (L/min): 5  L/min FiO2 (%): 28 % Pain: Pain Assessment Pain Assessment: Faces Pain Score: 0-No pain   See Function Navigator for Current Functional Status.   Therapy/Group: Individual Therapy  Luberta Mutter 10/04/2016, 11:49 AM

## 2016-10-04 NOTE — Progress Notes (Signed)
[facility] PHYSICAL MEDICINE & REHABILITATION     PROGRESS NOTE    Subjective/Complaints: Had a fair night. RN reports no problems. [name] denies pain and says breathing is ok at present  ROS: Limited due to cognitive/behavioral   Objective: Vital Signs: Blood pressure 115/78, pulse 84, temperature 98.2 F (36.8 C), temperature source Axillary, resp. rate 18, height 5\' 2"  (1.575 m), weight 41.7 kg (92 lb), last menstrual period 12/01/2010, SpO2 99 %. No results found. No results for input(s): WBC, HGB, HCT, PLT in the last 72 hours.  Recent Labs  10/03/16 0731 10/04/16 0707  NA 135 135  K 3.1* 3.9  CL 95* 97*  GLUCOSE 104* 128*  BUN 5* 9  CREATININE <0.30* 0.37*  CALCIUM 9.2 9.0   CBG (last 3)   Recent Labs  10/04/16 0005 10/04/16 0600 10/04/16 1210  GLUCAP 95 124* 120*    Wt Readings from Last 3 Encounters:  10/04/16 41.7 kg (92 lb)  09/30/16 44.2 kg (97 lb 6.4 oz)  07/14/16 46.5 kg (102 lb 9.6 oz)    Physical Exam:  Constitutional: She appears listless. She appears cachectic. No distress.  HENT:  Head: Normocephalicand atraumatic.  Mouth/Throat: Oropharynx is clear but dry  Eyes: Pupils are equal, round, and reactive to light. Conjunctivaeare normal.  Neck:  Decreased ROM. Cardiovascular: Normal rateand regular rhythm.  Respiratory: generally comfortable, diffuse rhonchi, shallow breaths.  GI: Soft. Bowel sounds are normal. She exhibits no distension. There is no tenderness.  Genitourinary:  Genitourinary Comments: labial/perineal area sl red/swollen.  Musculoskeletal: She exhibits no edemaor tenderness.  Neuro: Ongoing flexor tone in UE's with rhythmic movements/dyskinesias noted. LE's with hamstring tightness LLE, extensor tone RLE.  She appears alert, tracks me when I talk. Attempts to mouth answers to my questions Skin: a few scattered abrasions Psych: flat  Assessment/Plan: 1. Functional and mobility deficits secondary to recent [place]  encephalitis and subsequent pneumonia/bronchitis/respiratory sequelae which require 3+ hours per day of interdisciplinary therapy in a comprehensive inpatient rehab setting. Physiatrist is providing close team supervision and 24 hour management of active medical problems listed below. Physiatrist and rehab team continue to assess barriers to discharge/monitor patient progress toward functional and medical goals.  Function:  Bathing Bathing position Bathing activity did not occur: Safety/medical concerns (total A bed level night bath)    Bathing parts      Bathing assist        Upper Body Dressing/Undressing Upper body dressing   What is the patient wearing?: Hospital gown                Upper body assist Assist Level: 2 helpers      Lower Body Dressing/Undressing Lower body dressing   What is the patient wearing?: Pants, Socks, Shoes       Pants- Performed by helper: Thread/unthread right pants leg, Pull pants up/down, Fasten/unfasten pants, Thread/unthread left pants leg       Socks - Performed by helper: Don/doff right sock, Don/doff left sock   Shoes - Performed by helper: Don/doff right shoe, Don/doff left shoe, Fasten right, Fasten left          Lower body assist Assist for lower body dressing: 2 Helpers      Toileting Toileting     Toileting steps completed by helper: Adjust clothing prior to toileting, Performs perineal hygiene, Adjust clothing after toileting    Toileting assist Assist level: Two helpers   Transfers Chair/bed transfer   Chair/bed transfer method: Stand pivot Chair/bed  transfer assist level: 2 helpers Chair/bed transfer assistive device: Sliding board Mechanical lift: Ecologist Ambulation activity did not occur: Safety/medical Editor, commissioning activity did not occur: N/A Type: Manual (dependent in tilt in space chair )      Cognition Comprehension Comprehension assist level:  Understands basic less than 25% of the time/ requires cueing >75% of the time  Expression Expression assist level: Expresses basis less than 25% of the time/requires cueing >75% of the time.  Social Interaction Social Interaction assist level: Interacts appropriately less than 25% of the time. May be withdrawn or combative.  Problem Solving Problem solving assist level: Solves basic less than 25% of the time - needs direction nearly all the time or does not effectively solve problems and may need a restraint for safety  Memory Memory assist level: Recognizes or recalls less than 25% of the time/requires cueing greater than 75% of the time   Medical Problem List and Plan: 1. 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 DVT Prophylaxis/Anticoagulation: Pharmaceutical: Lovenox 3. Pain Management: N/A 4. Mood: LCSW to follow for evaluation and support.  5. Neuropsych: This patient is notcapable of making decisions on herown behalf. 6. Skin/Wound Care: Air mattress overlay. Routine pressure relief measures.  -appreciate Harmon RN recs 7. Fluids/Electrolytes/Nutrition: Strict I/O. Continue tube feeds at current rate. Consulted dietitian for input.   -I personally reviewed the patient's labs again today.   8. MRSA tracheobronchitis: pt continues on iv vanc--clinically improved. Will transition to bactrim (allergic to doxycycline) for a two week duration of treatment (has been on abx since 8/31). Consider longer duration depending upon clinical appearance 9. Hyponatremia: Improved. Dc IVF NS  -H20 through tube 10. Urinary retention: Monitor voiding with PVR checks --cath for volumes > 350cc 11. HTN: Monitor BP bid. Continue Norvasc 12. H/o constipation: Will continue current regimen for now.  13. VDRF: Chest PT bid. ATC for humidification.  Suction prn. HOB elevated, aspiration precautions  14. Hypokalemia: continue supplementation   LOS (Days) 4 A FACE TO FACE EVALUATION WAS PERFORMED  Meredith Staggers, MD 10/04/2016 2:43 PM

## 2016-10-04 NOTE — Progress Notes (Signed)
Attempted to check trach x3.  OT with pt  Pt tolerating well.  Pt  With RN and CNA pt tolerating well, Pt with other therapy tolerating well.

## 2016-10-04 NOTE — Progress Notes (Signed)
Occupational Therapy Session Note  Patient Details  Name: Judy Spears MRN: [id number] Date of Birth: 06-15-1950  Today's Date: 10/04/2016 OT Individual Time: 2025-4270 OT Individual Time Calculation (min): 75 min    Short Term Goals: Week 1:  OT Short Term Goal 1 (Week 1): Pt will complete STEDY transfer with max A +1 in order to decrease caregiver burden OT Short Term Goal 2 (Week 1): Pt will complete sliding board transfer to therapy mat with +2 assist in prep for functional transfer OT Short Term Goal 3 (Week 1): Pt will maintain static sitting balance with feet supported with mod A in prep for functional sitting on BSC. OT Short Term Goal 4 (Week 1): Pt will tolerate UE PROM HEP performed by caregivers  Skilled Therapeutic Interventions/Progress Updates:    Pt seen for OT session focusing on functional transfers, activity tolerance, and midline positioning. Pt asleep in supine upon arrival, easily aroused and appearing agreeable to tx session. She required total A +2 for LB dressing, increased difficulty due to LE flexor tone.  Total A to come into sitting EOB and +2 for sit>stand in STEDY for transfer into w/c. Pt very sweaty from nights sleep, therefore UB bathing/dressing completed- requiring total A.  Sitting in STEDY, positioned pt in front of mirror, pillows placed to facilitate maintaining of midline as pt with R hip preference and manual facilitation for upright posture. Pt appearing to tolerate well, though required total A to maintain this position. Returned to supine with +2 assist in order for nursing to complete cathing, pt left in supine with RN and NT present.   Therapy Documentation  Precautions:  Precautions Precautions: Fall Precaution Comments: Trach, PEG Restrictions Weight Bearing Restrictions: No  See Function Navigator for Current Functional Status.   Therapy/Group: Individual Therapy  [name], Tannya Gonet C 10/04/2016, 7:08 AM

## 2016-10-04 NOTE — Progress Notes (Signed)
Speech Language Pathology Daily Session Note  Patient Details  Name: Judy Spears MRN: [id number] Date of Birth: Feb 15, 1950  Today's Date: 10/04/2016 SLP Individual Time: 0900-0930 SLP Individual Time Calculation (min): 30 min  Short Term Goals: Week 1: SLP Short Term Goal 1 (Week 1): Pt will maintain arousal in regards to keeping her eyes open fr ~ 60 seconds with Max A multimodal cues.  SLP Short Term Goal 2 (Week 1): Pt will tolerate PMSV without any decline in vitals with Mod A cues. SLP Short Term Goal 3 (Week 1): Pt will produce vocalizations with Max A multimodal cues.  SLP Short Term Goal 4 (Week 1): Given oral stimulation and Max A cues, pt will initiate a swallow in 50% of opportunities.  SLP Short Term Goal 5 (Week 1): Pt will complete very basic problem solving tasks with Max A cues.   Skilled Therapeutic Interventions: Skilled treatment session focused on cognition goals. When SLP arrived to room, nursing was transferring pt to wheelchair via steady. Despite Max A multimodal cues, pt unable to attend to directions or attempt to follow cues for hand placement or to locate speaker. Pt was alert for all of the ~ 30 minute session. Oral care initiated with hand over hand support given by SLP. When SLP removed hand, pt with not attempts to facilitate movement or task. During oral care with suction toothbrush, pt with no gag reflex. Pt with increased saliva during task and although suctioning was being provided during task, pt with cough suspicious of some saliva not being orally contained. Pt with some attempts to move lips in response to questions but unable to produce any voicing.  PMSV in place during entire session without decline in vitals. Husband states that pt didn't wear over weekend d/t fatigue. Pt left upright in wheelchair with personal care attendant and PMSV in place. Continue per current plan of care.      Function:  Eating Eating   Modified Consistency Diet: No             Cognition Comprehension Comprehension assist level: Understands basic less than 25% of the time/ requires cueing >75% of the time  Expression   Expression assist level: Expresses basis less than 25% of the time/requires cueing >75% of the time.  Social Interaction Social Interaction assist level: Interacts appropriately less than 25% of the time. May be withdrawn or combative.  Problem Solving Problem solving assist level: Solves basic less than 25% of the time - needs direction nearly all the time or does not effectively solve problems and may need a restraint for safety  Memory Memory assist level: Recognizes or recalls less than 25% of the time/requires cueing greater than 75% of the time    Pain    Therapy/Group: Individual Therapy  Emanuelle Bastos 10/04/2016, 10:05 AM

## 2016-10-04 NOTE — Consult Note (Signed)
[facility] Nurse wound consult note Reason for Consult: sacrum Wound type: MASD (moisture associated skin damage) Pressure Injury POA: NA Measurement: area affected unable to be measured today, limited ability to access  Wound bed: blanchable redness over the lower buttocks with superficial skin peeling consistent with MASD (moisture associated skin damage), satellite lesions consistent with fungal overgrowth Patient is incontinent of bowel and bladder Drainage (amount, consistency, odor) none Periwound: intact  Dressing procedure/placement/frequency: Add antifungal cream, stop other barrier creams until satellite lesions resolved  Limit use of incontinence brief to when patient is in therapy sessions. LALM in place for moisture management  Discussed POC with patient's paid CG at the bedside and bedside nurse.  Re consult if needed, will not follow at this time. Thanks  Henryk Ursin [employer], RN,CWOCN, CNS, [place] [phone])

## 2016-10-04 NOTE — Progress Notes (Signed)
Social Work  Social Work Assessment and Plan  Patient Details  Name: Judy Spears MRN: [id number] Date of Birth: 07/19/1950  Today's Date: 10/04/2016  Problem List:  Patient Active Problem List   Diagnosis Date Noted  . Severe muscle deconditioning 09/30/2016  . Pressure injury of skin 09/28/2016  . Respiratory failure ([place])   . Tracheobronchitis   . Urinary retention   . PEG (percutaneous endoscopic gastrostomy) status ([place])   . Benign essential HTN   . Hyponatremia   . Hypokalemia   . Reactive hypertension   . Respiratory insufficiency 09/24/2016  . Malignant neoplasm of upper-inner quadrant of left breast in female, estrogen receptor positive ([place]) 07/14/2016  . Heterotopic ossification of bone 03/10/2016  . Acute on chronic respiratory failure with hypoxia ([place])   . Acute hypoxemic respiratory failure ([place])   . Acute blood loss anemia   . Atelectasis 02/04/2016  . Debility 02/03/2016  . Pneumonia of both lower lobes due to Pseudomonas species ([place])   . Pneumonia of both lower lobes due to methicillin susceptible Staphylococcus aureus (MSSA) ([place])   . Acute respiratory failure with hypoxia ([place])   . Acute encephalopathy   . Seizures ([place])   . Sepsis ([place])   . Hypoxia   . Pain   . HCAP (healthcare-associated pneumonia) 12/28/2015  . Chronic respiratory failure ([place]) 12/28/2015  . Acute tracheobronchitis 12/24/2015  . Tracheostomy tube present ([place])   . Tracheal stenosis   . Chronic respiratory failure with hypoxia ([place])   . 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 Tracheostomy status ([place]) 09/26/2015  . Allergic rhinitis 09/26/2015  . 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 Movement disorder 09/22/2015  . Encephalitis 09/22/2015  . Chest pain 02/07/2014  . Premature atrial contractions 01/13/2014  . Abnormality of gait 12/04/2013  . Hyperlipidemia 12/21/2011  . Cardiovascular risk factor 12/21/2011  . Bunion 01/31/2008  . Metatarsalgia of both feet  09/20/2007  . FLAT FOOT 09/20/2007  . HALLUX RIGIDUS, ACQUIRED 09/20/2007   Past Medical History:  Past Medical History:  Diagnosis Date  . Arthritis    lt great toe  . Complication of anesthesia    pt has had headaches post op-did advise to hydra well preop  . Hair loss    right-sided  . History of exercise stress test    a. ETT (12/15) with 12:00 exercise, no ST changes, occasional PACs.   . Hyperlipidemia   . Insomnia   . Migraine headache   . Movement disorder    resltess in left legs  . Postmenopausal   . 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 Premature atrial contractions    a. Holter (12/15) with 8% PACs, no atrial runs or atrial fibrillation.    Past Surgical History:  Past Surgical History:  Procedure Laterality Date  . BREAST BIOPSY  01/01/2011   Procedure: BREAST BIOPSY WITH NEEDLE LOCALIZATION;  Surgeon: Rolm Bookbinder, MD;  Location: [facility];  Service: General;  Laterality: Left;  left breast wire localization biopsy  . BREAST BIOPSY Right 07/27/2012  . BREAST BIOPSY Right 07/07/2016  . BREAST BIOPSY Left 06/30/2016  . cataracts right eye    . CESAREAN SECTION  1986  . HERNIA REPAIR  2000   RIH  . IR [place] TUBE PERCUT W/FLUORO  09/29/2016  . opirectinal membrane peel    . PEG PLACEMENT    . RHINOPLASTY  1983  . TRACHEOSTOMY     Social History:  reports that she has never smoked. She has  never used smokeless tobacco. She reports that she drinks alcohol. She reports that she does not use drugs.  Family / Support Systems Marital Status: Married How Long?: 50 yrs Patient Roles: Spouse, Parent Spouse/Significant Other: Annelyse Rey @ (C) [phone] or [phone] Children: daughter, Charlie Pitter [facility]) @ (C) [phone];  daughter, Jeanett Schlein [facility]) and son, Lorina Rabon [facility] Other Supports: private caregiver, Harrie Foreman @ ([phone] Anticipated Caregiver: spouse and caregiver, [name] Ability/Limitations of Caregiver: none;  spouse works Building control surveyor Availability: 24/7 Family Dynamics: All family and private caregiver very supportive and extremely attentive and encouraging to pt.    Social History Preferred language: English Religion: Christian Cultural Background: NA Education: lawyer Read: Yes Write: Yes Employment Status: Disabled Date Retired/Disabled/Unemployed: Nov 2016 Legal Hisotry/Current Legal Issues: None - per MD, pt is not capable of making decisions on her own behalf - spouse is POA Guardian/Conservator: None   Abuse/Neglect Physical Abuse: Denies Verbal Abuse: Denies Sexual Abuse: Denies Exploitation of patient/patient's resources: Denies Self-Neglect: Denies  Emotional Status Pt's affect, behavior adn adjustment status: Pt with severe cognitive deficits and impaire communication.  She does smile when I (re) introduce myself and welcome her back to [facility] (here Jan 2018).  Defer to spouse who notes no significant changes to their living situation since last admit or to their caregiving team.  Cannot assess if any emotional distress - will rely on family to report if they feel there is a concern. Recent Psychosocial Issues: Became ill in Nov 2016 from a tick bite and has been through many, many hospitalizaions and rehab programs since then. Pyschiatric History: None Substance Abuse History: None  Patient / Family Perceptions, Expectations & Goals Pt/Family understanding of illness & functional limitations: Spouse and caregiver with good understanding of her decline likely due to MRSA/ fever and recent tx for breast cancer. Premorbid pt/family roles/activities: Pt required 24/7 max - total assistance from family and private duty caregiver. Anticipated changes in roles/activities/participation: Little change anticipated - will resume 24/7 care via caregivers. Pt/family expectations/goals: "We just want to get some strength back for her."  [employer]: None Premorbid  Home Care/DME Agencies: Other (Comment) (OP tx a [facility]) Transportation available at discharge: yes  Discharge Planning Living Arrangements: Spouse/significant other, Non-relatives/Friends Support Systems: Spouse/significant other, Children, Other relatives, Friends/neighbors, Immunologist, Home care staff Type of Residence: Private residence Insurance Resources: Medicare (([place] Medicare)) Financial Resources: SSD Financial Screen Referred: No Living Expenses: Own Money Management: Spouse Does the patient have any problems obtaining your medications?: No Home Management: spouse and caregiver Patient/Family Preliminary Plans: Pt to return home with spouse and private duty caregiver resuming 24/7 care. Social Work Anticipated Follow Up Needs: HH/OP Expected length of stay: 16-18 days  Clinical Impression Very unfortunate woman here initially became hospitalized in Nov 2016.  She completed several months of rehab at [facility] and returned home to [place] last October.  She is followed at the [facility] as OP and had one prior stay on CIR in Jan 2018.  Plan to return home with husband and private duty caregiver providing 24/7 assistance as they were PTA.  Will follow for support and d/c planning needs.  Kensi Karr 10/04/2016, 2:31 PM

## 2016-10-04 NOTE — Progress Notes (Addendum)
Had two charts open at time of obtaining consult and contact information given in error to Dr. Paulita Fujita.  GI called back to confirm and name/information on consult clarified

## 2016-10-05 ENCOUNTER — Inpatient Hospital Stay (HOSPITAL_COMMUNITY): Payer: Medicare Other | Admitting: Occupational Therapy

## 2016-10-05 ENCOUNTER — Inpatient Hospital Stay (HOSPITAL_COMMUNITY): Payer: Medicare Other | Admitting: Speech Pathology

## 2016-10-05 ENCOUNTER — Inpatient Hospital Stay (HOSPITAL_COMMUNITY): Payer: Medicare Other | Admitting: Physical Therapy

## 2016-10-05 LAB — GLUCOSE, CAPILLARY
GLUCOSE-CAPILLARY: 134 mg/dL — AB (ref 65–99)
Glucose-Capillary: 134 mg/dL — ABNORMAL HIGH (ref 65–99)
Glucose-Capillary: 104 mg/dL — ABNORMAL HIGH (ref 65–99)

## 2016-10-05 MED ORDER — PRIMIDONE 50 MG PO TABS: 25.0000 mg | ORAL_TABLET | Freq: Every day | ORAL | Status: DC

## 2016-10-05 MED ORDER — PRIMIDONE 50 MG PO TABS
50.0000 mg | ORAL_TABLET | Freq: Every day | ORAL | Status: DC
  Administered 2016-10-05 – 2016-10-10 (×6): 50 mg
  Filled 2016-10-05 (×6): qty 1

## 2016-10-05 MED ORDER — ORAL CARE MOUTH RINSE
15.0000 mL | Freq: Two times a day (BID) | OROMUCOSAL | Status: DC
  Administered 2016-10-05 – 2016-10-26 (×42): 15 mL via OROMUCOSAL

## 2016-10-05 MED ORDER — CHLORHEXIDINE GLUCONATE 0.12 % MT SOLN
15.0000 mL | Freq: Two times a day (BID) | OROMUCOSAL | Status: DC
  Administered 2016-10-05 – 2016-10-27 (×45): 15 mL via OROMUCOSAL
  Filled 2016-10-05 (×45): qty 15

## 2016-10-05 MED ORDER — OSMOLITE 1.5 CAL PO LIQD
1000.0000 mL | ORAL | Status: DC
  Administered 2016-10-05 – 2016-10-12 (×10): 1000 mL
  Filled 2016-10-05 (×13): qty 1000

## 2016-10-05 MED ORDER — ORAL CARE MOUTH RINSE: 15.0000 mL | Freq: Two times a day (BID) | OROMUCOSAL | Status: DC

## 2016-10-05 MED ORDER — PRIMIDONE 50 MG PO TABS
25.0000 mg | ORAL_TABLET | Freq: Every day | ORAL | Status: DC
  Filled 2016-10-05: qty 0.5

## 2016-10-05 NOTE — Progress Notes (Signed)
Recreational Therapy Session Note  Patient Details  Name: Judy Spears MRN: [id number] Date of Birth: 07-08-1950 Today's Date: 10/05/2016   Pt is not yet appropriate for TR services due to low activity tolerance.  Will continue to monitor through team for future participation. Tavaughn Silguero 10/05/2016, 12:08 PM

## 2016-10-05 NOTE — Progress Notes (Signed)
Speech Language Pathology Daily Session Note  Patient Details  Name: Judy Spears MRN: [id number] Date of Birth: Mar 20, 1950  Today's Date: 10/05/2016 SLP Individual Time: 0820-0900 SLP Individual Time Calculation (min): 40 min  Short Term Goals: Week 1: SLP Short Term Goal 1 (Week 1): Pt will maintain arousal in regards to keeping her eyes open fr ~ 60 seconds with Max A multimodal cues.  SLP Short Term Goal 2 (Week 1): Pt will tolerate PMSV without any decline in vitals with Mod A cues. SLP Short Term Goal 3 (Week 1): Pt will produce vocalizations with Max A multimodal cues.  SLP Short Term Goal 4 (Week 1): Given oral stimulation and Max A cues, pt will initiate a swallow in 50% of opportunities.  SLP Short Term Goal 5 (Week 1): Pt will complete very basic problem solving tasks with Max A cues.   Skilled Therapeutic Interventions: Skilled treatment session focused on speech goals. Upon arrival, patient was asleep while supine in bed. SLP provided Max A verbal and tactile cues for arousal throughout session for ~1-2 minute intervals. PMSV was donned and patient tolerated valve without signs of distress and with all vitals remaining [employer] for ~40 minutes. Patient with minimal verbalizations throughout session but attempted to mouth words that were unintelligibile. Patient only vocalized in pain during spasms. Spent extensive time with patient's husband and caregiver establishing goals of care. Patient left supine in bed with RN. Continue with current plan of care.       Function:   Cognition Comprehension Comprehension assist level: Understands basic less than 25% of the time/ requires cueing >75% of the time  Expression   Expression assist level: Expresses basis less than 25% of the time/requires cueing >75% of the time.  Social Interaction Social Interaction assist level: Interacts appropriately less than 25% of the time. May be withdrawn or combative.  Problem Solving Problem solving assist  level: Solves basic less than 25% of the time - needs direction nearly all the time or does not effectively solve problems and may need a restraint for safety  Memory Memory assist level: Recognizes or recalls less than 25% of the time/requires cueing greater than 75% of the time    Pain No/Denies Pain   Therapy/Group: Individual Therapy  Shiasia Porro 10/05/2016, 2:18 PM

## 2016-10-05 NOTE — Progress Notes (Signed)
Physical Therapy Session Note  Patient Details  Name: Judy Spears MRN: [id number] Date of Birth: 06-Feb-1950  Today's Date: 10/05/2016 PT Individual Time: 1000-1100 PT Individual Time Calculation (min): 60 min   Short Term Goals: Week 1:  PT Short Term Goal 1 (Week 1): Pt will roll with max assist +2 PT Short Term Goal 2 (Week 1): Pt will supine>sit with max assist +2 PT Short Term Goal 3 (Week 1): Pt will sit<>stand with max assist +2  PT Short Term Goal 4 (Week 1): Pt will tolerate sitting up in chair for 4 hrs a day PT Short Term Goal 5 (Week 1): Pt will sit with UE support for 1 min with min guard   Skilled Therapeutic Interventions/Progress Updates: Pt received seated in w/c, no evidence of pain at rest however pt does vocalize and demo increased spasms/dyskinesias when LLE moved or touched throughout session usually requiring removal of stimulus to reduce apparent discomfort. Pt on 6L O2 via trach collar and PMSV intact throughout session; O2 checked periodically >96% with no evidence of distress. Transferred to mat table totalA +2 squat pivot d/t discomfort when LLE moved in prep for use of stedy. RUE elbow weight bearing in sitting on mat table for L trunk lengthening and promoting neutral spine. Following stretching pt then able to demo static sitting balance with RUE support 20-30 sec. Sit <>stand from slightly elevated mat table with BUE on therapist's shoulders and maxA +2 however pt does demonstrate improved anterior weight shifting, activation through RLE during sit <>stand. Sit <>stand in stedy maxA +2; partial sit <>stands from stedy seat with maxA and unable to isometrically hold standing position when assistance removed/reduced. Mirror utilized for visual feedback in partial standing in stedy seat. Requires use of yoga blocks/bolsters to promote neutral alignment of feet/hips. Sit <>stand in parallel bars x2 trials with maxA +1. Standing frame performed x5 min total in partial  standing position, limited by LLE pain and suspect hamstring stretch discomfort however reduced discomfort with time in standing. Unable to reach full standing position d/t discomfort and pt declining raising sling up more for further range. Handoff to OT at end of session.      Therapy Documentation Precautions:  Precautions Precautions: Fall Precaution Comments: Trach, PEG Restrictions Weight Bearing Restrictions: No  See Function Navigator for Current Functional Status.   Therapy/Group: Individual Therapy  Luberta Mutter 10/05/2016, 11:13 AM

## 2016-10-05 NOTE — Progress Notes (Signed)
Nutrition Follow-up  DOCUMENTATION CODES:   Severe malnutrition in context of chronic illness, Underweight  INTERVENTION:  Continue Osmolite 1.5 formula via PEG by 10 ml every 4 hours to new goal rate of 60 ml/hr x 20 hours (may hold TF for up to 4 hours for therapy) to provide 1800 kcal, 75 grams of protein, and 912 ml of water.  Continue free water flushes of 175 ml QID via tube. Total free water: 1612 ml/day.  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  GOAL:   Patient will meet greater than or equal to 90% of their needs; met  MONITOR:   TF tolerance, Skin, Weight trends, Labs, I & O's  REASON FOR ASSESSMENT:   Consult Enteral/tube feeding initiation and management  ASSESSMENT:   66 y.o. 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 She was admitted on 09/24/16 with lethargy. SOB and low grade fevers due to MRSA tracheobronchitis v/s PNA. PEG tube evaluated by IVR and was exchanged out on 9/5. Patient has had significant decline in mobility and ability to carryout ADL tasks. CIR recommended by rehab team.  Pt has been tolerating her tube feeds with no other difficulties. Spoke with Jeannene Patella, PA, as caregiver wanting to change tube feeds over to home tube feeding regimen of nocturnal feeds of Osmolite 1.5 @ 85 ml/hr x 14 hours (5pm-7am). Plans to continue with current continuous feeds until pt strengthens her endurance with therapy. Noted pt admitted with aspiration PNA during acute inpatient admission, thus monitoring tube feeds for now. RD to continue to monitor.  Nutrition-Focused physical exam completed. Findings are severe fat depletion, severe muscle depletion, and no edema.    Labs and mediations reviewed.   Diet Order:  Diet NPO time specified  Skin:  Wound (see comment) (Stage I to buttocks)  Last BM:  9/7  Height:   Ht Readings from Last 1 Encounters:  09/30/16 '5\' 2"'$  (1.575 m)    Weight:   Wt Readings from Last 1 Encounters:  10/05/16 89 lb (40.4 kg)    Ideal Body Weight:  50 kg  BMI:  Body mass index is 16.28 kg/m.  Estimated Nutritional Needs:   Kcal:  1700-1900  Protein:  65-75 gram  Fluid:  1.7-1.9 L/day  EDUCATION NEEDS:   No education needs identified at this time  Corrin Parker, MS, RD, LDN Pager # [phone] After hours/ weekend pager # [phone]

## 2016-10-05 NOTE — Progress Notes (Signed)
Occupational Therapy Session Note  Patient Details  Name: Judy Spears MRN: [id number] Date of Birth: 02-25-1950  Today's Date: 10/05/2016 OT Individual Time: 1100-1200 and 1300-1330 OT Individual Time Calculation (min): 60 min and 30 min   Short Term Goals: Week 1:  OT Short Term Goal 1 (Week 1): Pt will complete STEDY transfer with max A +1 in order to decrease caregiver burden OT Short Term Goal 2 (Week 1): Pt will complete sliding board transfer to therapy mat with +2 assist in prep for functional transfer OT Short Term Goal 3 (Week 1): Pt will maintain static sitting balance with feet supported with mod A in prep for functional sitting on BSC. OT Short Term Goal 4 (Week 1): Pt will tolerate UE PROM HEP performed by caregivers  Skilled Therapeutic Interventions/Progress Updates:    Session One: Pt seen for OT session focusing on functional sitting balance and positioning with reducing tone in prep for functional task. Pt received with hand off from PT, pt agreeable to tx session.  Attempted several sit <> stands with pt's hands placed on armrests of chair placed in front with therapist sitting in simulation of modified stand from [facility] for caregivers to provide care. Completed with max A to stand and pt clearing buttock ~4 inches from chair.  In therapy gym, completed total A sliding board transfer w/c> EOM. Completed transfer with pt's LEs supported on footrests and pt with increased tremors in B LEs when feet are not in weightbearing position. Seated EOM, focused on L oblique lengthening/ R oblique shortening, having pt reach to R and wedge placed under R hip to facilitate. She returned to w/c in same manner as described  Above.  Pt left in tilt-in-space w/c in room with care attendant present.  Pt tolertared PMSV well throughout session, 5L supplemental O2 remaining WFL throughout session.   Session Two: Pt seen for OT session focusing on functional use of STEDY and tolerance of upright  sitting/ standing. Pt in tilt-in-space upon arrival, appearing agreeable to tx session. Pt able to stand into STEDY with max A +1 pt able to assist with reaching towards grab bars. Pt with strong R lean and anterior weight shift , requiring total A for sitting balance in STEDY.  Returned to EOB and supine with +2 assist.  Pt requiring soothing technique to be used throughout session to calm tremors and hyperactivity. Placed hand splints on to prevent self scratching. Pt left in supine for nursing to complete cathing, personal care attendant present.    Therapy Documentation Precautions:  Precautions Precautions: Fall Precaution Comments: Trach, PEG Restrictions Weight Bearing Restrictions: No  See Function Navigator for Current Functional Status.   Therapy/Group: Individual Therapy  [name], Gissella Niblack C 10/05/2016, 7:08 AM

## 2016-10-05 NOTE — Progress Notes (Signed)
[facility] PHYSICAL MEDICINE & REHABILITATION     PROGRESS NOTE    Subjective/Complaints: Just awakening this morning. Husband at bedside  ROS: Limited due to cognitive/behavioral   Objective: Vital Signs: Blood pressure 102/72, pulse 88, temperature 100.2 F (37.9 C), temperature source Axillary, resp. rate (!) 36, height 5\' 2"  (1.575 m), weight 40.4 kg (89 lb), last menstrual period 12/01/2010, SpO2 97 %. Ct Head Wo Contrast  Result Date: 10/04/2016 CLINICAL DATA:  Altered level of consciousness. 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 Recent decline. EXAM: CT HEAD WITHOUT CONTRAST TECHNIQUE: Contiguous axial images were obtained from the base of the skull through the vertex without intravenous contrast. COMPARISON:  CT HEAD January 04, 2016 FINDINGS: Moderately motion degraded examination. BRAIN: No intraparenchymal hemorrhage, mass effect nor midline shift. Moderate ventriculomegaly with disproportionate ballooning of the frontal horns compatible with overlying volume loss. Advanced cerebellar atrophy. No acute large vascular territory infarcts. Patchy to confluent supratentorial white matter hypodensities. No abnormal extra-axial fluid collections. Basal cisterns are patent. VASCULAR: Moderate calcific atherosclerosis of the carotid siphons. SKULL: No skull fracture. Severe LEFT temporomandibular osteoarthrosis. No significant scalp soft tissue swelling. SINUSES/ORBITS: The mastoid air-cells and included paranasal sinuses are well-aerated.The included ocular globes and orbital contents are non-suspicious. Status post bilateral ocular lens implants. OTHER: None. IMPRESSION: 1. No acute intracranial process on this moderately motion degraded examination. 2. Stable examination including advanced bifrontal atrophy seen with neuro degenerative disease. 3. Advanced cerebellar atrophy which can be secondary to anti seizure medication. 4. Moderate chronic small vessel ischemic  disease. Electronically Signed   By: Elon Alas M.D.   On: 10/04/2016 22:09   No results for input(s): WBC, HGB, HCT, PLT in the last 72 hours.  Recent Labs  10/03/16 0731 10/04/16 0707  NA 135 135  K 3.1* 3.9  CL 95* 97*  GLUCOSE 104* 128*  BUN 5* 9  CREATININE <0.30* 0.37*  CALCIUM 9.2 9.0   CBG (last 3)   Recent Labs  10/04/16 1633 10/04/16 2352 10/05/16 0602  GLUCAP 142* 123* 134*    Wt Readings from Last 3 Encounters:  10/05/16 40.4 kg (89 lb)  09/30/16 44.2 kg (97 lb 6.4 oz)  07/14/16 46.5 kg (102 lb 9.6 oz)    Physical Exam:  Constitutional:   cachectic. No distress.  HENT:  Head: Normocephalicand atraumatic.  Mouth/Throat: Oropharynx is clear but dry  Eyes: Pupils are equal, round, and reactive to light. Conjunctivaeare normal.  Neck:  Decreased ROM. Cardiovascular: RRR with murmur.    Respiratory: scattered rhonchi. No distress GI: Soft. Bowel sounds are normal. She exhibits no distension. There is no tenderness.  Genitourinary:  Genitourinary Comments: labial/perineal  .  Musculoskeletal: She exhibits no edemaor tenderness.  Neuro: Ongoing flexor tone in UE's with rhythmic movements/dyskinesias noted, pill rolling RUE. LE's with hamstring tightness LLE, extensor tone RLE.  Pain with efforts at PROM She appears distracted this morning.  Skin: a few scattered abrasions Psych: flat  Assessment/Plan: 1. Functional and mobility deficits secondary to recent [place] encephalitis and subsequent pneumonia/bronchitis/respiratory sequelae which require 3+ hours per day of interdisciplinary therapy in a comprehensive inpatient rehab setting. Physiatrist is providing close team supervision and 24 hour management of active medical problems listed below. Physiatrist and rehab team continue to assess barriers to discharge/monitor patient progress toward functional and medical goals.  Function:  Bathing Bathing position Bathing activity did not  occur: Safety/medical concerns (total A bed level night bath)    Bathing parts  Bathing assist        Upper Body Dressing/Undressing Upper body dressing   What is the patient wearing?: Hospital gown                Upper body assist Assist Level: 2 helpers      Lower Body Dressing/Undressing Lower body dressing   What is the patient wearing?: Pants, Socks, Shoes       Pants- Performed by helper: Thread/unthread right pants leg, Pull pants up/down, Fasten/unfasten pants, Thread/unthread left pants leg       Socks - Performed by helper: Don/doff right sock, Don/doff left sock   Shoes - Performed by helper: Don/doff right shoe, Don/doff left shoe, Fasten right, Fasten left          Lower body assist Assist for lower body dressing: 2 Helpers      Toileting Toileting     Toileting steps completed by helper: Adjust clothing prior to toileting, Performs perineal hygiene, Adjust clothing after toileting    Toileting assist Assist level: Two helpers   Transfers Chair/bed transfer   Chair/bed transfer method: Stand pivot Chair/bed transfer assist level: 2 helpers Chair/bed transfer assistive device: Sliding board Mechanical lift: Ecologist Ambulation activity did not occur: Safety/medical Editor, commissioning activity did not occur: N/A Type: Manual (dependent in tilt in space chair )      Cognition Comprehension Comprehension assist level: Understands basic less than 25% of the time/ requires cueing >75% of the time  Expression Expression assist level: Expresses basis less than 25% of the time/requires cueing >75% of the time.  Social Interaction Social Interaction assist level: Interacts appropriately less than 25% of the time. May be withdrawn or combative.  Problem Solving Problem solving assist level: Solves basic less than 25% of the time - needs direction nearly all the time or does not effectively solve  problems and may need a restraint for safety  Memory Memory assist level: Recognizes or recalls less than 25% of the time/requires cueing greater than 75% of the time   Medical Problem List and Plan: 1. 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 Will review films later with husband.  2. DVT Prophylaxis/Anticoagulation: Pharmaceutical: Lovenox 3. Pain Management: N/A 4. Mood: LCSW to follow for evaluation and support.  5. Neuropsych: This patient is notcapable of making decisions on herown behalf. 6. Skin/Wound Care: Air mattress overlay. Routine pressure relief measures.  -appreciate [place] RN recs 7. Fluids/Electrolytes/Nutrition: Strict I/O. Continue tube feeds at current rate. Consulted dietitian for input.   -I personally reviewed the patient's labs again today.   8. MRSA tracheobronchitis: pt continues on iv vanc--clinically improved. Will transition to bactrim (allergic to doxycycline) for a two week duration of treatment (has been on abx since 8/31). Consider longer duration depending upon clinical appearance 9. Hyponatremia: Improved. Dc IVF NS  -H20 through tube 10. Urinary retention: Monitor voiding with PVR checks --cath for volumes > 350cc 11. HTN: Monitor BP bid. Continue Norvasc 12. H/o constipation: Will continue current regimen for now.  13. VDRF: Chest PT bid. ATC for humidification. Suction prn. HOB elevated, aspiration precautions  14. Hypokalemia: continue supplementation 15. Dyskinesias: will add low dose mysoline beginning at bed time  -valium reduced to 1mg    LOS (Days) 5 A FACE TO FACE EVALUATION WAS  PERFORMED  Meredith Staggers, MD 10/05/2016 8:48 AM

## 2016-10-06 ENCOUNTER — Inpatient Hospital Stay (HOSPITAL_COMMUNITY): Payer: Medicare Other | Admitting: Physical Therapy

## 2016-10-06 ENCOUNTER — Ambulatory Visit (HOSPITAL_COMMUNITY): Payer: Medicare Other

## 2016-10-06 ENCOUNTER — Inpatient Hospital Stay (HOSPITAL_COMMUNITY): Payer: Medicare Other | Admitting: Occupational Therapy

## 2016-10-06 ENCOUNTER — Inpatient Hospital Stay (HOSPITAL_COMMUNITY): Payer: Medicare Other | Admitting: Speech Pathology

## 2016-10-06 LAB — BASIC METABOLIC PANEL
ANION GAP: 8 (ref 5–15)
BUN: 11 mg/dL (ref 6–20)
CO2: 28 mmol/L (ref 22–32)
Calcium: 9.1 mg/dL (ref 8.9–10.3)
Chloride: 93 mmol/L — ABNORMAL LOW (ref 101–111)
Creatinine, Ser: 0.41 mg/dL — ABNORMAL LOW (ref 0.44–1.00)
Glucose, Bld: 97 mg/dL (ref 65–99)
POTASSIUM: 4.7 mmol/L (ref 3.5–5.1)
SODIUM: 129 mmol/L — AB (ref 135–145)

## 2016-10-06 LAB — MAGNESIUM: MAGNESIUM: 1.9 mg/dL (ref 1.7–2.4)

## 2016-10-06 LAB — GLUCOSE, CAPILLARY
GLUCOSE-CAPILLARY: 124 mg/dL — AB (ref 65–99)
Glucose-Capillary: 112 mg/dL — ABNORMAL HIGH (ref 65–99)

## 2016-10-06 LAB — PREALBUMIN: Prealbumin: 18 mg/dL (ref 18–38)

## 2016-10-06 MED ORDER — SODIUM CHLORIDE 0.9 % IV SOLN
INTRAVENOUS | Status: DC
  Administered 2016-10-06: 1000 mL via INTRAVENOUS
  Administered 2016-10-07 – 2016-10-09 (×4): via INTRAVENOUS

## 2016-10-06 MED ORDER — FREE WATER
100.0000 mL | Freq: Three times a day (TID) | Status: DC
  Administered 2016-10-06 – 2016-10-09 (×13): 100 mL

## 2016-10-06 NOTE — Progress Notes (Signed)
Occupational Therapy Session Note  Patient Details  Name: Judy Spears MRN: [id number] Date of Birth: [birth year]  Today's Date: 10/06/2016 OT Individual Time: 1400-1430 (1430-1500 co-tx with SLP) OT Individual Time Calculation (min): 30 min (total time 80 Min)   Short Term Goals: Week 1:  OT Short Term Goal 1 (Week 1): Pt will complete STEDY transfer with max A +1 in order to decrease caregiver burden OT Short Term Goal 2 (Week 1): Pt will complete sliding board transfer to therapy mat with +2 assist in prep for functional transfer OT Short Term Goal 3 (Week 1): Pt will maintain static sitting balance with feet supported with mod A in prep for functional sitting on BSC. OT Short Term Goal 4 (Week 1): Pt will tolerate UE PROM HEP performed by caregivers  Skilled Therapeutic Interventions/Progress Updates:    Pt seen for OT session focusing on functional transfers and positioning and as part of co-tx with SLP. Pt sitting up in tilt-in-space w/c upon arrival with personal care attendant present. In therapy gym, completed total A sliding board transfer to therapy mat. Worked on forward flexion to reach towards feet in prep for functional task and transfers. Max- total A sitting balance. Pt able to consistently follow one step commands throughout sessions for tasks. Completed x3 sit<> stand from EOM 3 musketeer style. Pt unable to tolerate weightbearing through L LE, hyper-sensative to touch and weightbearing.  At end of session, completed stand pivot transfer back to w/c 3 musketeer style, pt tolerating well. See SLP note for description of co -tx. Pt returned back to room at end of session, left sitting up in chair with care attendant present.   Therapy Documentation Precautions:  Precautions Precautions: Fall Precaution Comments: Trach, PEG Restrictions Weight Bearing Restrictions: No Pain:   No/ denies pain  See Function Navigator for Current Functional Status.   Therapy/Group:  Individual Therapy  [name], Barnett Elzey C 10/06/2016, 3:39 PM

## 2016-10-06 NOTE — Progress Notes (Signed)
Physical Therapy Session Note  Patient Details  Name: Judy Spears MRN: [id number] Date of Birth: 02-16-1950  Today's Date: 10/06/2016 PT Individual Time: 1000-1115 and 1315-1345 PT Individual Time Calculation (min): 75 min and 30 min (total 105 min)   Short Term Goals: Week 1:  PT Short Term Goal 1 (Week 1): Pt will roll with max assist +2 PT Short Term Goal 2 (Week 1): Pt will supine>sit with max assist +2 PT Short Term Goal 3 (Week 1): Pt will sit<>stand with max assist +2  PT Short Term Goal 4 (Week 1): Pt will tolerate sitting up in chair for 4 hrs a day PT Short Term Goal 5 (Week 1): Pt will sit with UE support for 1 min with min guard   Skilled Therapeutic Interventions/Progress Updates: Tx 1: Pt received supine in bed, denies pain at rest however evidence of pain with LLE moved or unsupported throughout session with pt vocalizing discomfort however unable to further describe. Supine>sit with semi-log roll and +2A. Sitting balance on edge of mat maxA initially, reduced to standbyA once RUE supported on stedy. MaxA +2 for upper body dressing with pt initiating reaching with BUE to thread bra and shirt, does not assist with threading pants and performed totalA for time management d/t increased discomfort when stedy moved and LEs unsupported in order to thread pants and shoes. Sit <>stand in stedy maxA +2 with minimal activation noted RLE, no activation observed in LLE to assist, and significant difficulty maintaining L foot on the ground due to flexor withdraw, hypertonia, spasticity and pain when attempting to reposition LE. Transferred to w/c totalA in stedy. Once seated in stedy, utilized yoga block between feet to reduce spasms in LEs. Caregiver performed washing hair totalA with shampoo cap. Returned pt to bed totalA +2 squat pivot with NT to prepare for I&O catheter; totalA +2 sit >supine. Remained semi-reclined in bed with handoff to nursing staff at completion of session.   Tx 2: Pt  received seated in w/c, denies pain and agreeable to treatment. Squat pivot transfer w/c <>mat table maxA for energy/time conservation. Sitting balance modA on edge of mat table. Sit <> R sidelying totalA +2. Pt positioned in R sidelying x8 min with bolster pillow under R flank to promote L trunk lengthening. Provided passive over stretch to L flank to inhibit oblique activation. Returned to chair as above; remained seated in w/c at end of session with caregiver present, all needs in reach.      Therapy Documentation Precautions:  Precautions Precautions: Fall Precaution Comments: Trach, PEG Restrictions Weight Bearing Restrictions: No   See Function Navigator for Current Functional Status.   Therapy/Group: Individual Therapy  Luberta Mutter 10/06/2016, 11:32 AM

## 2016-10-06 NOTE — Progress Notes (Signed)
[facility] PHYSICAL MEDICINE & REHABILITATION     PROGRESS NOTE    Subjective/Complaints: Just awakening this morning. Husband at bedside  ROS: Limited due to cognitive/behavioral   Objective: Vital Signs: Blood pressure (!) 105/56, pulse 83, temperature 97.8 F (36.6 C), temperature source Axillary, resp. rate 18, height 5\' 2"  (1.575 m), weight 39 kg (86 lb), last menstrual period 12/01/2010, SpO2 99 %. Ct Head Wo Contrast  Result Date: 10/04/2016 CLINICAL DATA:  Altered level of consciousness. 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 Recent decline. EXAM: CT HEAD WITHOUT CONTRAST TECHNIQUE: Contiguous axial images were obtained from the base of the skull through the vertex without intravenous contrast. COMPARISON:  CT HEAD January 04, 2016 FINDINGS: Moderately motion degraded examination. BRAIN: No intraparenchymal hemorrhage, mass effect nor midline shift. Moderate ventriculomegaly with disproportionate ballooning of the frontal horns compatible with overlying volume loss. Advanced cerebellar atrophy. No acute large vascular territory infarcts. Patchy to confluent supratentorial white matter hypodensities. No abnormal extra-axial fluid collections. Basal cisterns are patent. VASCULAR: Moderate calcific atherosclerosis of the carotid siphons. SKULL: No skull fracture. Severe LEFT temporomandibular osteoarthrosis. No significant scalp soft tissue swelling. SINUSES/ORBITS: The mastoid air-cells and included paranasal sinuses are well-aerated.The included ocular globes and orbital contents are non-suspicious. Status post bilateral ocular lens implants. OTHER: None. IMPRESSION: 1. No acute intracranial process on this moderately motion degraded examination. 2. Stable examination including advanced bifrontal atrophy seen with neuro degenerative disease. 3. Advanced cerebellar atrophy which can be secondary to anti seizure medication. 4. Moderate chronic small vessel ischemic disease.  Electronically Signed   By: Elon Alas M.D.   On: 10/04/2016 22:09   No results for input(s): WBC, HGB, HCT, PLT in the last 72 hours.  Recent Labs  10/04/16 0707 10/06/16 0652  NA 135 129*  K 3.9 4.7  CL 97* 93*  GLUCOSE 128* 97  BUN 9 11  CREATININE 0.37* 0.41*  CALCIUM 9.0 9.1   CBG (last 3)   Recent Labs  10/05/16 1206 10/06/16 0000 10/06/16 0644  GLUCAP 104* 134* 124*    Wt Readings from Last 3 Encounters:  10/06/16 39 kg (86 lb)  09/30/16 44.2 kg (97 lb 6.4 oz)  07/14/16 46.5 kg (102 lb 9.6 oz)    Physical Exam:  Constitutional:   cachectic. No distress.  HENT:  Head: Normocephalicand atraumatic.  Mouth/Throat: Oropharynx is clear but dry  Eyes: Pupils are equal, round, and reactive to light. Conjunctivaeare normal.  Neck:  Decreased ROM. Cardiovascular: RRR with murmur.    Respiratory: scattered rhonchi. No distress GI: Soft. Bowel sounds are normal. She exhibits no distension. There is no tenderness.  Genitourinary:  Genitourinary Comments: labial/perineal  .  Musculoskeletal: She exhibits no edemaor tenderness.  Neuro: Ongoing flexor tone in UE's with rhythmic movements/dyskinesias noted, pill rolling RUE. LE's with hamstring tightness LLE, extensor tone RLE.  Pain with efforts at PROM She appears distracted this morning.  Skin: a few scattered abrasions Psych: flat  Assessment/Plan: 1. Functional and mobility deficits secondary to recent [place] encephalitis and subsequent pneumonia/bronchitis/respiratory sequelae which require 3+ hours per day of interdisciplinary therapy in a comprehensive inpatient rehab setting. Physiatrist is providing close team supervision and 24 hour management of active medical problems listed below. Physiatrist and rehab team continue to assess barriers to discharge/monitor patient progress toward functional and medical goals.  Function:  Bathing Bathing position Bathing activity did not occur:  Safety/medical concerns (total A bed level night bath)    Bathing parts  Bathing assist        Upper Body Dressing/Undressing Upper body dressing   What is the patient wearing?: Hospital gown                Upper body assist Assist Level: 2 helpers      Lower Body Dressing/Undressing Lower body dressing   What is the patient wearing?: Pants, Socks, Shoes       Pants- Performed by helper: Thread/unthread right pants leg, Pull pants up/down, Fasten/unfasten pants, Thread/unthread left pants leg       Socks - Performed by helper: Don/doff right sock, Don/doff left sock   Shoes - Performed by helper: Don/doff right shoe, Don/doff left shoe, Fasten right, Fasten left          Lower body assist Assist for lower body dressing: 2 Helpers      Toileting Toileting     Toileting steps completed by helper: Adjust clothing prior to toileting, Performs perineal hygiene, Adjust clothing after toileting    Toileting assist Assist level: Two helpers   Transfers Chair/bed transfer   Chair/bed transfer method: Stand pivot Chair/bed transfer assist level: 2 helpers Chair/bed transfer assistive device: Sliding board Mechanical lift: Ecologist Ambulation activity did not occur: Safety/medical Editor, commissioning activity did not occur: N/A Type: Manual (dependent in tilt in space chair )      Cognition Comprehension Comprehension assist level: Understands basic less than 25% of the time/ requires cueing >75% of the time  Expression Expression assist level: Expresses basis less than 25% of the time/requires cueing >75% of the time.  Social Interaction Social Interaction assist level: Interacts appropriately less than 25% of the time. May be withdrawn or combative.  Problem Solving Problem solving assist level: Solves basic less than 25% of the time - needs direction nearly all the time or does not effectively solve problems and  may need a restraint for safety  Memory Memory assist level: Recognizes or recalls less than 25% of the time/requires cueing greater than 75% of the time   Medical Problem List and Plan: 1. 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 Honestly not much change from previous HCT 12/17. Doesn't explain clinical change over the summer.  Reviewed films at length with husband daughter yesterday. Will review with Dr. Leonie Man also 2. DVT Prophylaxis/Anticoagulation: Pharmaceutical: Lovenox 3. Pain Management: N/A 4. Mood: LCSW to follow for evaluation and support.  5. Neuropsych: This patient is notcapable of making decisions on herown behalf. 6. Skin/Wound Care: Air mattress overlay. Routine pressure relief measures.  -appreciate [place] RN recs 7. Fluids/Electrolytes/Nutrition: Strict I/O. Appreciate RD help  -osmolite 1.5 goal rate of 60cc/hrx 20 hr  -prealbumin,mg++ today 8. MRSA tracheobronchitis: pt continues on iv vanc--clinically improved. Will transition to bactrim (allergic to doxycycline) for a two week duration of treatment (has been on abx since 8/31). Consider longer duration depending upon clinical appearance  -follow up cbc in AM 9. Hyponatremia: recurrent 129 today  -restart IV NS  -reduce H20 through tube  -serial labs 10. Urinary retention: Monitor voiding with PVR checks --cath for volumes > 350cc 11. HTN: Monitor BP bid. Continue Norvasc 12. H/o constipation:   continue current regimen for now.  13. VDRF: Chest PT bid. ATC for humidification. Suction prn. HOB elevated, aspiration precautions  14.  Hypokalemia: continue supplementation 15. Dyskinesias/tremors: added low dose mysoline beginning at bed time   -consider day time dosing also  -valium reduced to 1mg  QHS 16. Breast cancer with hx of  lumpectomy  -Husband states that [facility] is recommending that arimidex be resumed.   -husband is unsure that he wants to resume in the near future.    LOS (Days) 6 A FACE TO FACE EVALUATION WAS PERFORMED  Meredith Staggers, MD 10/06/2016 8:51 AM

## 2016-10-06 NOTE — Progress Notes (Signed)
Speech Language Pathology Daily Session Note  Patient Details  Name: Judy Spears MRN: [id number] Date of Birth: 1950/09/22  Today's Date: 10/06/2016 SLP Co-Treatment Time: 1430-1500 SLP Co-Treatment Time Calculation (min): 30 min  Short Term Goals: Week 1: SLP Short Term Goal 1 (Week 1): Pt will maintain arousal in regards to keeping her eyes open fr ~ 60 seconds with Max A multimodal cues.  SLP Short Term Goal 2 (Week 1): Pt will tolerate PMSV without any decline in vitals with Mod A cues. SLP Short Term Goal 3 (Week 1): Pt will produce vocalizations with Max A multimodal cues.  SLP Short Term Goal 4 (Week 1): Given oral stimulation and Max A cues, pt will initiate a swallow in 50% of opportunities.  SLP Short Term Goal 5 (Week 1): Pt will complete very basic problem solving tasks with Max A cues.   Skilled Therapeutic Interventions: Skilled co-treatment session with OT focused on speech goals while patient sitting upright on mat. SLP facilitated session by providing Max A verbal, tactile and auditory feedback with use of a whistle to facilitate diaphragamatic breathing. Patient able to voice /ah/ in 50% of opportunities for ~1-2 seconds with Max A multimodal cues. Patient also noted to count with 70% intelligibility while standing, however,suspect  increased vocal intensity is 2/2 to pain and discomfort. Patient left upright in wheelchair with all needs within reach and caregiver present. Continue with current plan of care.      Function:  Cognition Comprehension Comprehension assist level: Understands basic less than 25% of the time/ requires cueing >75% of the time  Expression   Expression assist level: Expresses basis less than 25% of the time/requires cueing >75% of the time.  Social Interaction Social Interaction assist level: Interacts appropriately less than 25% of the time. May be withdrawn or combative.  Problem Solving Problem solving assist level: Solves basic less than 25% of  the time - needs direction nearly all the time or does not effectively solve problems and may need a restraint for safety  Memory Memory assist level: Recognizes or recalls less than 25% of the time/requires cueing greater than 75% of the time    Pain Intermittent pain with positional changes   Therapy/Group: Individual Therapy  Kazue Cerro 10/06/2016, 3:40 PM

## 2016-10-07 ENCOUNTER — Inpatient Hospital Stay (HOSPITAL_COMMUNITY): Payer: Medicare Other | Admitting: Occupational Therapy

## 2016-10-07 ENCOUNTER — Inpatient Hospital Stay (HOSPITAL_COMMUNITY): Payer: Medicare Other

## 2016-10-07 ENCOUNTER — Encounter: Payer: Medicare Other | Admitting: Occupational Therapy

## 2016-10-07 ENCOUNTER — Inpatient Hospital Stay (HOSPITAL_COMMUNITY): Payer: Medicare Other | Admitting: Physical Therapy

## 2016-10-07 ENCOUNTER — Inpatient Hospital Stay (HOSPITAL_COMMUNITY): Payer: Medicare Other | Admitting: Speech Pathology

## 2016-10-07 ENCOUNTER — Ambulatory Visit: Payer: Medicare Other | Admitting: Physical Therapy

## 2016-10-07 ENCOUNTER — Encounter: Payer: Self-pay | Admitting: Physical Therapy

## 2016-10-07 LAB — GLUCOSE, CAPILLARY
GLUCOSE-CAPILLARY: 105 mg/dL — AB (ref 65–99)
GLUCOSE-CAPILLARY: 107 mg/dL — AB (ref 65–99)
GLUCOSE-CAPILLARY: 128 mg/dL — AB (ref 65–99)
Glucose-Capillary: 99 mg/dL (ref 65–99)

## 2016-10-07 LAB — BASIC METABOLIC PANEL
Anion gap: 11 (ref 5–15)
BUN: 13 mg/dL (ref 6–20)
CALCIUM: 8.6 mg/dL — AB (ref 8.9–10.3)
CO2: 21 mmol/L — ABNORMAL LOW (ref 22–32)
CREATININE: 0.35 mg/dL — AB (ref 0.44–1.00)
Chloride: 96 mmol/L — ABNORMAL LOW (ref 101–111)
GFR calc Af Amer: >60 mL/min (ref >60)
Glucose, Bld: 105 mg/dL — ABNORMAL HIGH (ref 65–99)
Potassium: 5.3 mmol/L — ABNORMAL HIGH (ref 3.5–5.1)
SODIUM: 128 mmol/L — AB (ref 135–145)

## 2016-10-07 MED ORDER — PRIMIDONE 50 MG PO TABS
25.0000 mg | ORAL_TABLET | Freq: Every day | ORAL | Status: DC
  Administered 2016-10-07 – 2016-10-08 (×2): 25 mg
  Filled 2016-10-07 (×2): qty 0.5

## 2016-10-07 NOTE — Progress Notes (Signed)
Physical Therapy Note  Patient Details  Name: ANNIEBELL BEDORE MRN: [id number] Date of Birth: [birth year] Today's Date: 10/07/2016  1330-1400, 30 min individual tx Pain: none communicated from pt  Use of MaxiMove to transfer pt from tilt- in- space w/c> tilt table.   On tilt table, pt tolerated 40 degrees upright from horizontal with RLE wt bearing and LLE minimal wt bearing on Yoga block, with mod assist for midline orientation due to L lean and L head turn.  With cues, pt able to relax L hip and knee to eventually lower L foot to platform without use of block. Pt holds L hip in external rotation, but tolerated grade I joint mobilizations to hip and gentle PROM to neutral.  During joint mobs, pt participated verbally by counting 1-10 forwards and backwards audibly after 1 cue, with PMV in place. With multimodal cues, and min assist,  she used L thumb and index finger to press "up" arrow on tilt table control. Pt remained on tilt table; passed to Amy, OT for next therapy.  See function navigator for current status.   Govind Furey 10/07/2016, 10:27 AM

## 2016-10-07 NOTE — Patient Care Conference (Signed)
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 Severe muscle deconditioning 09/30/2016  . Pressure injury of skin 09/28/2016  . Respiratory failure ([place])   . Tracheobronchitis   . Urinary retention   . PEG (percutaneous endoscopic gastrostomy) status ([place])   . Benign essential HTN   . Hyponatremia   . Hypokalemia   . Reactive hypertension   . Respiratory insufficiency 09/24/2016  . Malignant neoplasm of upper-inner quadrant of left breast in female, estrogen receptor positive ([place]) 07/14/2016  . Heterotopic ossification of bone 03/10/2016  . Acute on chronic respiratory failure with hypoxia ([place])   . Acute hypoxemic respiratory failure ([place])   . Acute blood loss anemia   . Atelectasis 02/04/2016  . Debility 02/03/2016  . Pneumonia of both lower lobes due to Pseudomonas species ([place])   . Pneumonia of both lower lobes due to methicillin susceptible Staphylococcus aureus (MSSA) ([place])   . Acute respiratory failure with hypoxia ([place])   . Acute encephalopathy   . Seizures ([place])   . Sepsis ([place])   . Hypoxia   . Pain   . HCAP (healthcare-associated pneumonia) 12/28/2015  . Chronic respiratory failure ([place]) 12/28/2015  . Acute tracheobronchitis 12/24/2015  . Tracheostomy tube present ([place])   . Tracheal stenosis   . Chronic respiratory failure with hypoxia ([place])   . 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 Tracheostomy status  ([place]) 09/26/2015  . Allergic rhinitis 09/26/2015  . 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 Movement disorder 09/22/2015  . Encephalitis 09/22/2015  . Chest pain 02/07/2014  . Premature atrial contractions 01/13/2014  . Abnormality of gait 12/04/2013  . Hyperlipidemia 12/21/2011  . Cardiovascular risk factor 12/21/2011  . Bunion 01/31/2008  . Metatarsalgia of both feet 09/20/2007  . FLAT FOOT 09/20/2007  . HALLUX RIGIDUS, ACQUIRED 09/20/2007    Expected Discharge Date: Expected Discharge Date: 10/20/16  Team Members Present: Physician leading conference: Dr. Alger Simons Social Worker Present: Lennart Pall, LCSW Nurse Present: Other (comment) Timothy Lasso, RN) PT Present: Kem Parkinson, PT OT Present: Roanna Epley, COTA;Napoleon Form, OT SLP Present: Weston Anna, SLP     Current Status/Progress Goal Weekly Team Focus  Medical   readmitted after functional decline from multiple medical, long-term sequelae of encephalitis  improve tone  further assessment for etiology of movement issues, tone mgt, nutrition   Bowel/Bladder   incontinent of b/b, unable to void since admission when foley was removed. voided at 0645 today, pvr was 228. total assist with b/b, LBM 9/10 x2 after suppository  question if suprapubic catheter would better serve pt's long term care?  bladder scan q 4-6 hours and post void, I& O cath for volumes > 315ml   Swallow/Nutrition/ Hydration   NPO with PEG  Max A for swallow iniitation with thermal stimulation  swallow initiation   ADL's   Total A +2  Max- total A +1  bed mobility, functional transfers (  via STEDY and sliding board), sitting balance, neuro re-ed   Mobility   totalA +2  maxA +1 bed mobility and transfers with stedy; possible use of hoyer long term d/t pain and suspect slow progress  bed mobility, sit <>stand in stedy, sititng balance, postural alignment/PROM to trunk/extremities   Communication   PMSV with full supervision,  total A  Max A  speech intelligibility at the word level    Safety/Cognition/ Behavioral Observations  Total A  Max A  arousal, attention    Pain   pain is not an issue         Skin   total A with skin care, mouth is severely dry, mouth is open at all times, little to no saliva, mouthcare q 2 hours, Peg site reddened, soap and water wash then antibiotic oint.,  MASD improving to periarea, however still reddened to buttocks.lotrimin cream started 9/10. abrasion from foley to periarea healed, petechiae to L flank possibly?  no further breakdown while on IPR, total A  continue to monitor skin q shift and prn.    Rehab Goals Patient on target to meet rehab goals: Yes *See Care Plan and progress notes for long and short-term goals.     Barriers to Discharge  Current Status/Progress Possible Resolutions Date Resolved   Physician    Medical stability;Neurogenic Bowel & Bladder        optimize nutrition, consult with onc/neurology regarding treatment plan      Nursing                  PT                    OT                  SLP                SW                Discharge Planning/Teaching Needs:  To return home with famiy and private duty caregiver resuming 24/7 assistance.  Teaching ongoing.   Team Discussion:  Medical decline at home and readmit to CIR.  abx to be completed today.  CT shows notable atrophy.  May trial other meds to address tone (too soon to give another botox injection).  Poor prognosis of regaining any continence.  Tremors are affecting transfers. Left hip appears painful for pt.  Cause?  ST working on tolerance of PMSV back to baseline.    Revisions to Treatment Plan:  None    Continued Need for Acute Rehabilitation Level of Care: The patient requires daily medical management by a physician with specialized training in physical medicine and rehabilitation for the following conditions: Daily direction of a multidisciplinary physical rehabilitation program to  ensure safe treatment while eliciting the highest outcome that is of practical value to the patient.: Yes Daily medical management of patient stability for increased activity during participation in an intensive rehabilitation regime.: Yes Daily analysis of laboratory values and/or radiology reports with any subsequent need for medication adjustment of medical intervention for : Neurological problems;Wound care problems;Nutritional problems  Tsion Inghram 10/07/2016, 4:15 PM

## 2016-10-07 NOTE — Progress Notes (Signed)
Occupational Therapy Session Note  Patient Details  Name: Judy Spears MRN: [id number] Date of Birth: 29-Aug-1950  Today's Date: 10/07/2016 OT Individual Time: 1400-1500 OT Individual Time Calculation (min): 60 min    Short Term Goals: Week 1:  OT Short Term Goal 1 (Week 1): Pt will complete STEDY transfer with max A +1 in order to decrease caregiver burden OT Short Term Goal 2 (Week 1): Pt will complete sliding board transfer to therapy mat with +2 assist in prep for functional transfer OT Short Term Goal 3 (Week 1): Pt will maintain static sitting balance with feet supported with mod A in prep for functional sitting on BSC. OT Short Term Goal 4 (Week 1): Pt will tolerate UE PROM HEP performed by caregivers  Skilled Therapeutic Interventions/Progress Updates:    Pt seen for OT session focusing on upright tolerance on tilt table and UE ROM while on table. Pt received on tilt table with hand off from PT. Pt tolerated tilt table at 40 degrees ~60 minutes, returning to neutral x2 for repositioning as pt with L lean. Repositioned with pillows to facilitate midline orientation. Pt tolerated weightbearing through R LE and partial weightbearing through L LE, tolerating much more proprioceptive and tactile input compared to previous sessions. Practiced grasping beach ball and shoulder flexion to move ball up and down and pass with family members. Practiced verbalizing words, intelligible ~50% of time.   Pt returned to w/c at end of session, left in tilt-in-space in room with personal care attendant present.    Therapy Documentation Precautions:  Precautions Precautions: Fall Precaution Comments: Trach, PEG Restrictions Weight Bearing Restrictions: No  See Function Navigator for Current Functional Status.   Therapy/Group: Individual Therapy  [name], Jyron Turman C 10/07/2016, 7:06 AM

## 2016-10-07 NOTE — Progress Notes (Addendum)
Speech Language Pathology Daily Session Note  Patient Details  Name: Judy Spears MRN: [id number] Date of Birth: 1950/10/28  Today's Date: 10/07/2016 SLP Individual Time: 1100-1200 SLP Individual Time Calculation (min): 60 min  Short Term Goals: Week 1: SLP Short Term Goal 1 (Week 1): Pt will maintain arousal in regards to keeping her eyes open fr ~ 60 seconds with Max A multimodal cues.  SLP Short Term Goal 2 (Week 1): Pt will tolerate PMSV without any decline in vitals with Mod A cues. SLP Short Term Goal 3 (Week 1): Pt will produce vocalizations with Max A multimodal cues.  SLP Short Term Goal 4 (Week 1): Given oral stimulation and Max A cues, pt will initiate a swallow in 50% of opportunities.  SLP Short Term Goal 4 - Progress (Week 1): Discontinued (comment) (Per family request) SLP Short Term Goal 5 (Week 1):  (Per family request) SLP Short Term Goal 5 - Progress (Week 1): Discontinued (comment) SLP Short Term Goal 6 (Week 1): given Max A cues, pt with return demonstrate various blowing exercises to increase respiratory support for vocalizations.   Skilled Therapeutic Interventions: Skilled treatment session focused on speech communication goals. SLP facilitiated session by providing Max A visual, verbal and tactile support to produce voicing and increase respiratory support. Tasks that targeted breath support included blowing a piece of a kleenex thru a straw. Pt required total A cues to achieve very low voicing during rote singing tasks (You are my sunshine, Happy Birthday). Pt able to achieve voicing in %05 of opportunities with simple consonants "b,M" with Total cues. Both husband and personal attendant present and exercises given to work on during day. Pt tolerate PMSV during entire session. Pt returned to room, left upright, seatbelt on, PMSV on and all needs within reach. Husband and personal care attendant present. Continue per current plan of care.       Function:    Cognition Comprehension Comprehension assist level: Understands basic 25 - 49% of the time/ requires cueing 50 - 75% of the time;Understands basic less than 25% of the time/ requires cueing >75% of the time  Expression   Expression assist level: Expresses basis less than 25% of the time/requires cueing >75% of the time.  Social Interaction Social Interaction assist level: Interacts appropriately less than 25% of the time. May be withdrawn or combative.  Problem Solving Problem solving assist level: Solves basic less than 25% of the time - needs direction nearly all the time or does not effectively solve problems and may need a restraint for safety  Memory Memory assist level: Recognizes or recalls less than 25% of the time/requires cueing greater than 75% of the time    Pain Pain Assessment Pain Assessment: Faces Pain Score: 0-No pain Faces Pain Scale: No hurt  Therapy/Group: Individual Therapy  Gasper Hopes 10/07/2016, 12:39 PM

## 2016-10-07 NOTE — Therapy (Signed)
[facility] [address] [place], [place], [address] Phone: [phone]   Fax:  [phone]  Patient Details  Name: Judy Spears MRN: [id number] Date of Birth: [birth year] Referring Provider:  No ref. provider found  Encounter Date: 10/07/2016    PHYSICAL THERAPY DISCHARGE SUMMARY  Visits from Start of Care: 50  Current functional level related to goals / functional outcomes: No LTG's achieved due to change in medical status - pt was [facility] to [facility] hospital with tracheobronchitis on 09-24-16  Please see last progress note for current status as of 09-10-15   Remaining deficits: Continued dependencies with all mobility and ADL's with inability to ambulate/stand Decreased coordination, strength and AROM of bil. UE's and LE's Decreased dynamic sitting balance    Education / Equipment:  Pt and caregiver have been instructed in HEP for LE ROM and strengthening; pt had been standing with caregiver's assistance prior to her decline in functional status due to Change in functional status - decline began during last week of July 2018  Plan: Patient agrees to discharge.  Patient goals were not met. Patient is being discharged due to a change in medical status.  ?????  Pt is being admitted to inpatient rehab per message from pt's husband (message received on 10-04-16)          Viva Gallaher, [place], PT 10/07/2016, 3:27 PM  [facility] [address] [place], [place], [address] Phone: [phone]   Fax:  [phone]

## 2016-10-07 NOTE — Progress Notes (Signed)
Physical Therapy Session Note  Patient Details  Name: Judy Spears MRN: [id number] Date of Birth: 08-22-1950  Today's Date: 10/07/2016 PT Individual Time: 1000-1100 PT Individual Time Calculation (min): 60 min   Short Term Goals: Week 1:  PT Short Term Goal 1 (Week 1): Pt will roll with max assist +2 PT Short Term Goal 2 (Week 1): Pt will supine>sit with max assist +2 PT Short Term Goal 3 (Week 1): Pt will sit<>stand with max assist +2  PT Short Term Goal 4 (Week 1): Pt will tolerate sitting up in chair for 4 hrs a day PT Short Term Goal 5 (Week 1): Pt will sit with UE support for 1 min with min guard   Skilled Therapeutic Interventions/Progress Updates: Pt received seated in w/c with handoff from RN; denies pain and agreeable to treatment. Transfer w/c >mat table modified stand pivot with LUE over therapist should and totalA. Sit <>stand from edge of mat table with LUE around therapist 2x3 reps; tactile cueing at knees for stance control, cueing at hips for activation and hip/trunk extension. Sit <>supine totalA +2. Pt unable to tolerate supine position d/t pain; determined through questioning that supine/hooklying position causes pain in back. Side lying L trunk stretching to promote neutral trunk alignment. Attempted to stretch LLE in sidelying, however limited by spasms/tone and pain with pt beginning to vocalize discomfort and increased tremors when stretching attempted. Sidelying<>half-supine on wedge with focus on LUE reaching, head positioning to facilitate rolling back to sidelying; requires max cues and tactile facilitation. Sit <>stand x3 additional reps from mat table max/totalA and unable to reach full standing position. Sit<>stand x1 rep in hallway with RUE on rail and max/totalA; pt with improved anterior weight shift and initiation of task when RUE on rail compared to from mat table. Handoff to SLP at end of session.      Therapy Documentation Precautions:   Precautions Precautions: Fall Precaution Comments: Trach, PEG Restrictions Weight Bearing Restrictions: No Pain: Pain Assessment Pain Assessment: Faces Pain Score: 0-No pain Faces Pain Scale: No hurt   See Function Navigator for Current Functional Status.   Therapy/Group: Individual Therapy  Luberta Mutter 10/07/2016, 11:19 AM

## 2016-10-07 NOTE — Progress Notes (Signed)
[facility] PHYSICAL MEDICINE & REHABILITATION     PROGRESS NOTE    Subjective/Complaints: Had an uneventful night. Awakened fairly easily  ROS: Limited due to cognitive/behavioral   Objective: Vital Signs: Blood pressure 109/64, pulse 88, temperature (!) 97.5 F (36.4 C), temperature source Axillary, resp. rate 18, height 5\' 2"  (1.575 m), weight 39.5 kg (87 lb), last menstrual period 12/01/2010, SpO2 100 %. No results found. No results for input(s): WBC, HGB, HCT, PLT in the last 72 hours.  Recent Labs  10/06/16 0652 10/07/16 0558  NA 129* 128*  K 4.7 5.3*  CL 93* 96*  GLUCOSE 97 105*  BUN 11 13  CREATININE 0.41* 0.35*  CALCIUM 9.1 8.6*   CBG (last 3)   Recent Labs  10/06/16 2236 10/07/16 0621 10/07/16 0807  GLUCAP 112* 105* 107*    Wt Readings from Last 3 Encounters:  10/07/16 39.5 kg (87 lb)  09/30/16 44.2 kg (97 lb 6.4 oz)  07/14/16 46.5 kg (102 lb 9.6 oz)    Physical Exam:  Constitutional:   cachectic. No distress.  HENT:  Head: Normocephalicand atraumatic.  Mouth/Throat: Oropharynx is clear but dry  Eyes: Pupils are equal, round, and reactive to light. Conjunctivaeare normal.  Neck:  Decreased ROM. Cardiovascular: RRR without murmur. No JVD .    Respiratory: CTA Bilaterally without wheezes or rales. Normal effort  GI: Soft. Bowel sounds are normal. She exhibits no distension. There is no tenderness.  Genitourinary:  Genitourinary Comments: labial/perineal redness .  Musculoskeletal: She exhibits no edemaor tenderness.  Neuro: Ongoing flexor tone in UE's with rhythmic movements/dyskinesias noted, pill rolling RUE. LE's with hamstring tightness LLE, extensor tone RLE.  Pain with efforts at PROM She appears distracted this morning.  Skin: a few scattered abrasions Psych: flat  Assessment/Plan: 1. Functional and mobility deficits secondary to recent [place] encephalitis and subsequent pneumonia/bronchitis/respiratory sequelae which require  3+ hours per day of interdisciplinary therapy in a comprehensive inpatient rehab setting. Physiatrist is providing close team supervision and 24 hour management of active medical problems listed below. Physiatrist and rehab team continue to assess barriers to discharge/monitor patient progress toward functional and medical goals.  Function:  Bathing Bathing position Bathing activity did not occur: Safety/medical concerns (total A bed level night bath)    Bathing parts      Bathing assist        Upper Body Dressing/Undressing Upper body dressing   What is the patient wearing?: Pull over shirt/dress, Bra   Bra - Perfomed by helper: Thread/unthread right bra strap, Thread/unthread left bra strap, Hook/unhook bra (pull down sports bra)   Pull over shirt/dress - Perfomed by helper: Thread/unthread right sleeve, Thread/unthread left sleeve, Put head through opening, Pull shirt over trunk        Upper body assist Assist Level: 2 helpers      Lower Body Dressing/Undressing Lower body dressing   What is the patient wearing?: Pants, Socks, Shoes       Pants- Performed by helper: Thread/unthread right pants leg, Pull pants up/down, Fasten/unfasten pants, Thread/unthread left pants leg       Socks - Performed by helper: Don/doff right sock, Don/doff left sock   Shoes - Performed by helper: Don/doff right shoe, Don/doff left shoe, Fasten right, Fasten left          Lower body assist Assist for lower body dressing: 2 Automotive engineer activity did not occur: No continent bowel/bladder event   Toileting steps completed  by helper: Adjust clothing prior to toileting, Performs perineal hygiene, Adjust clothing after toileting    Toileting assist Assist level: Two helpers   Transfers Chair/bed transfer   Chair/bed transfer method: Stand pivot Chair/bed transfer assist level: 2 helpers Chair/bed transfer assistive device: Sliding board Mechanical lift:  Stedy   Locomotion Ambulation Ambulation activity did not occur: Safety/medical Editor, commissioning activity did not occur: N/A Type: Manual (dependent in tilt in space chair )      Cognition Comprehension Comprehension assist level: Understands basic less than 25% of the time/ requires cueing >75% of the time  Expression Expression assist level: Expresses basis less than 25% of the time/requires cueing >75% of the time.  Social Interaction Social Interaction assist level: Interacts appropriately less than 25% of the time. May be withdrawn or combative.  Problem Solving Problem solving assist level: Solves basic less than 25% of the time - needs direction nearly all the time or does not effectively solve problems and may need a restraint for safety  Memory Memory assist level: Recognizes or recalls less than 25% of the time/requires cueing greater than 75% of the time   Medical Problem List and Plan: 1. 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 Honestly not much change from previous HCT 12/17. Doesn't explain clinical change over the summer.  Reviewed films at length with husband daughter yesterday. Have spoken to Dr. Leonie Man regarding her clinical course. We decided that pursuing an MRI is appropriate given her clinical presentation. 2. DVT Prophylaxis/Anticoagulation: Pharmaceutical: Lovenox 3. Pain Management: N/A 4. Mood: LCSW to follow for evaluation and support.  5. Neuropsych: This patient is notcapable of making decisions on herown behalf. 6. Skin/Wound Care: Air mattress overlay. Routine pressure relief measures.  -appreciate Amo RN recs 7. Fluids/Electrolytes/Nutrition: Strict I/O.    -osmolite 1.5 goal rate of 60cc/hrx 20 hr  -prealbumin is 18. Continue to adjust TF to meet  caloric needs 8. MRSA tracheobronchitis: pt continues on iv vanc--clinically improved.  has been on abx since 8/31.    -follow up cbc pending  -given hyponatremia, will stop bactrim after today's doses 9. Hyponatremia: recurrent 128 today 9/13  -restarted IV NS---continue  -reduced H20 through tube  -serial labs 10. Urinary retention: Monitor voiding with PVR checks --cath for volumes > 350cc 11. HTN: Monitor BP bid. Continue Norvasc 12. H/o constipation:   continue current regimen for now.  13. VDRF: Chest PT bid. ATC for humidification. Suction prn. HOB elevated, aspiration precautions  14. Hypokalemia: now high. Dc supp 15. Dyskinesias/tremors: added low dose mysoline beginning at bed time   -consider day time dosing also  -no dyskinesias while at rest  -valium reduced to 1mg  QHS 16. Breast cancer with hx of lumpectomy  -Husband states that [facility] is recommending that arimidex be resumed.   -husband is unsure that he wants to resume in the near future.   -I reviewed case with Dr. Jana Hakim who will visit patient and review with husband   LOS (Days) [place] T, MD 10/07/2016 9:03 AM

## 2016-10-08 ENCOUNTER — Encounter (HOSPITAL_COMMUNITY): Payer: Self-pay

## 2016-10-08 ENCOUNTER — Inpatient Hospital Stay (HOSPITAL_COMMUNITY): Payer: Medicare Other | Admitting: Speech Pathology

## 2016-10-08 ENCOUNTER — Inpatient Hospital Stay (HOSPITAL_COMMUNITY): Payer: Medicare Other | Admitting: Physical Therapy

## 2016-10-08 ENCOUNTER — Inpatient Hospital Stay (HOSPITAL_COMMUNITY): Payer: Medicare Other | Admitting: Occupational Therapy

## 2016-10-08 DIAGNOSIS — C50212 Malignant neoplasm of upper-inner quadrant of left female breast: Secondary | ICD-10-CM

## 2016-10-08 DIAGNOSIS — Z170 Estrogen receptor positive status [ER+]: Secondary | ICD-10-CM

## 2016-10-08 LAB — BASIC METABOLIC PANEL
ANION GAP: 8 (ref 5–15)
BUN: 9 mg/dL (ref 6–20)
CALCIUM: 8.8 mg/dL — AB (ref 8.9–10.3)
CO2: 28 mmol/L (ref 22–32)
Chloride: 93 mmol/L — ABNORMAL LOW (ref 101–111)
Creatinine, Ser: 0.32 mg/dL — ABNORMAL LOW (ref 0.44–1.00)
GFR calc non Af Amer: >60 mL/min (ref >60)
GLUCOSE: 88 mg/dL (ref 65–99)
POTASSIUM: 3.3 mmol/L — AB (ref 3.5–5.1)
Sodium: 129 mmol/L — ABNORMAL LOW (ref 135–145)

## 2016-10-08 LAB — GLUCOSE, CAPILLARY
Glucose-Capillary: 115 mg/dL — ABNORMAL HIGH (ref 65–99)
Glucose-Capillary: 132 mg/dL — ABNORMAL HIGH (ref 65–99)
Glucose-Capillary: 137 mg/dL — ABNORMAL HIGH (ref 65–99)
Glucose-Capillary: 139 mg/dL — ABNORMAL HIGH (ref 65–99)

## 2016-10-08 MED ORDER — LORAZEPAM 1 MG PO TABS
1.0000 mg | ORAL_TABLET | Freq: Every day | ORAL | Status: DC | PRN
  Administered 2016-10-09: 1 mg
  Filled 2016-10-08: qty 1

## 2016-10-08 MED ORDER — LORAZEPAM 1 MG PO TABS: 2.0000 mg | ORAL_TABLET | Freq: Every day | ORAL | Status: DC | PRN

## 2016-10-08 MED ORDER — POTASSIUM CHLORIDE 20 MEQ/15ML (10%) PO SOLN
20.0000 meq | Freq: Every day | ORAL | Status: DC
  Administered 2016-10-08 – 2016-10-27 (×20): 20 meq
  Filled 2016-10-08 (×20): qty 15

## 2016-10-08 MED ORDER — PRIMIDONE 50 MG PO TABS
25.0000 mg | ORAL_TABLET | Freq: Two times a day (BID) | ORAL | Status: DC
  Administered 2016-10-08 – 2016-10-14 (×12): 25 mg
  Filled 2016-10-08 (×13): qty 0.5

## 2016-10-08 NOTE — Progress Notes (Signed)
Judy Spears   DOB:1950/12/15   DD#:[id number]   WCB#:[id number]  Interval history: After I saw Judy Spears in 07/15/2016 we started anastrozole and she initially tolerated that well. She continued it through her left lumpectomy w/o sentinel node sampling 08/03/2016. This showed a pT1c pNX [cN0], stage IA invasive ductal carcinoma, grade 2, estrogen receptor 100% positive, progesterone receptor 5% positive, with no HER-2 amplification. She continued on anastrozole through 08/18/2016 when [facility] developed some "cramps" and the drug was held. A month later there had been no resolution, in fact progression of the cramps/contractures and [facility] suggested she go back on the drug, which almost certainly was not the cause of the deterioration. However the overall situation has been complex and the patient's husband has felt adding one more variable to the mix would not help-- I am in agreement with that. Accordingly at this point she has been off anastrozole approximately 6 weeks  Subjective:  Judy Spears remains non-verbal, but expressive in her face and movements. No family in room at this time   Objective: middle aged White woman examined in bed  Vitals:   10/08/16 0352 10/08/16 0408  BP: 130/60   Pulse: 76 81  Resp: 19 18  Temp: 98.1 F (36.7 C)   SpO2: 100% 100%    Body mass index is 16.13 kg/m.  Intake/Output Summary (Last 24 hours) at 10/08/16 0826 Last data filed at 10/07/16 2100  Gross per 24 hour  Intake                0 ml  Output             2175 ml  Net            -2175 ml     Sclerae unicteric  Trach in place  Heart regular rate and rhythm  Abdomen soft, +BS  Neuro left UE contracture makes breast exam difficult  Breast exam: left breast incision well healed; no masses noted, no skin or nipple changes of concern  CBG (last 3)   Recent Labs  10/07/16 1808 10/07/16 2324 10/08/16 0548  GLUCAP 132* 128* 137*     Labs:  Lab Results  Component Value Date   WBC 7.5 10/01/2016   HGB 14.4  10/01/2016   HCT 42.8 10/01/2016   MCV 85.3 10/01/2016   PLT 412 (H) 10/01/2016   NEUTROABS 5.2 10/01/2016    _0 @  Urine Studies No results for input(s): UHGB, CRYS in the last 72 hours.  Invalid input(s): UACOL, UAPR, USPG, UPH, UTP, UGL, [place], UBIL, UNIT, UROB, [place], UEPI, UWBC, Junie Panning [place], [place], [place]  Basic Metabolic Panel:  Recent Labs Lab 10/02/16 [phone] 10/03/16 0731 10/04/16 0707 10/06/16 0652 10/06/16 0945 10/07/16 0558  NA 136 135 135 129*  --  128*  K 3.2* 3.1* 3.9 4.7  --  5.3*  CL 95* 95* 97* 93*  --  96*  CO2 32 _1 --  21*  GLUCOSE 108* 104* 128* 97  --  105*  BUN 14 5* 9 11  --  13  CREATININE 0.30* <0.30* 0.37* 0.41*  --  0.35*  CALCIUM 8.9 9.2 9.0 9.1  --  8.6*  MG  --   --   --   --  1.9  --    GFR Estimated Creatinine Clearance: 44.3 mL/min (A) (by C-G formula based on SCr of 0.35 mg/dL (L)). Liver Function Tests: No results for input(s): AST, ALT, ALKPHOS, BILITOT, PROT, ALBUMIN in the last  168 hours. No results for input(s): LIPASE, AMYLASE in the last 168 hours. No results for input(s): AMMONIA in the last 168 hours. Coagulation profile No results for input(s): INR, PROTIME in the last 168 hours.  CBC: No results for input(s): WBC, NEUTROABS, HGB, HCT, MCV, PLT in the last 168 hours. Cardiac Enzymes: No results for input(s): CKTOTAL, CKMB, CKMBINDEX, TROPONINI in the last 168 hours. BNP: Invalid input(s): POCBNP CBG:  Recent Labs Lab 10/07/16 0807 10/07/16 1202 10/07/16 1808 10/07/16 2324 10/08/16 0548  GLUCAP 107* 99 132* 128* 137*   D-Dimer No results for input(s): DDIMER in the last 72 hours. Hgb A1c No results for input(s): HGBA1C in the last 72 hours. Lipid Profile No results for input(s): CHOL, HDL, LDLCALC, TRIG, CHOLHDL, LDLDIRECT in the last 72 hours. Thyroid function studies No results for input(s): TSH, T4TOTAL, T3FREE, THYROIDAB in the last 72 hours.  Invalid input(s): FREET3 Anemia work  up No results for input(s): VITAMINB12, FOLATE, FERRITIN, TIBC, IRON, RETICCTPCT in the last 72 hours. Microbiology No results found for this or any previous visit (from the past 240 hour(s)).    Studies:  No results found.  Assessment: 66 y.o. [place] woman status post left breast upper inner quadrant biopsy 06/30/2016 for a clinical T1c N0, stage IA invasive ductal carcinoma, grade 2, estrogen and progesterone receptor positive, HER-2 nonamplified, with an MIB-1 of 12%             (a) biopsy of an area of calcifications in the upper inner right breast 07/07/2016 showed fat necrosis  (1) anastrozole started neoadjuvantly 07/14/2016, discontinued 08/18/2016 because of cramps  (2) s/p left lumpectomy 08/03/2016 for a pT1c pNX, stage IA invasive ductal carcinoma, grade 2, estrogen and progesterone receptor positive, HER-2 not amplified  (a) not a candidate for radiation  (3) osteoporosis prevention: Consider denosumab/Prolia   Plan:  Judy Spears's situation is complex--she has an uncommon illness with what look like progressive complications. I have discussed anti-estrogen resumption with her husband Judy Spears and he understands I do not believe the anastrozole explains the current changes. Our feeling is until things are more stable attempting to resume anastrozole is not going to be possible--we would have to interrupt it to evaluate whether it is the cause of any new symptoms. It makes more sense to wait until Judy Spears is home and stable--hopefully next month.  Plan is then to resume anastrozole sometime in October; she has an appointment with me at the cancer center 11/25/2017.  Appreciate your help to this patient and her family!   Chauncey Cruel, MD 10/08/2016  8:26 AM Medical Oncology and Hematology [facility] [address] [place], [place] [address] Tel. [phone]    Fax. [phone]

## 2016-10-08 NOTE — Progress Notes (Addendum)
[place] PHYSICAL MEDICINE & REHABILITATION     PROGRESS NOTE    Subjective/Complaints: Slept well. Secretions minimal. In no pain.   ROS: pt denies nausea, vomiting, diarrhea, cough, shortness of breath or chest pain   Objective: Vital Signs: Blood pressure (!) 151/90, pulse 84, temperature 98.1 F (36.7 C), temperature source Axillary, resp. rate 18, height 5\' 2"  (1.575 m), weight 40 kg (88 lb 2.9 oz), last menstrual period 12/01/2010, SpO2 100 %. No results found. No results for input(s): WBC, HGB, HCT, PLT in the last 72 hours.  Recent Labs  10/06/16 0652 10/07/16 0558  NA 129* 128*  K 4.7 5.3*  CL 93* 96*  GLUCOSE 97 105*  BUN 11 13  CREATININE 0.41* 0.35*  CALCIUM 9.1 8.6*   CBG (last 3)   Recent Labs  10/07/16 1808 10/07/16 2324 10/08/16 0548  GLUCAP 132* 128* 137*    Wt Readings from Last 3 Encounters:  10/08/16 40 kg (88 lb 2.9 oz)  09/30/16 44.2 kg (97 lb 6.4 oz)  07/14/16 46.5 kg (102 lb 9.6 oz)    Physical Exam:  Constitutional:   Remains cachectic. No distress.  HENT:  Head: NCAT Mouth/Throat: Oropharynx is clear but dry  Eyes: Pupils are equal, round, and reactive to light. Conjunctivaeare normal.  Neck: minimal secretions from trach  . Cardiovascular: RRR without murmur. No JVD  .    Respiratory: occasional stridor.   GI: Soft. Bowel sounds are normal. She exhibits no distension. There is no tenderness.  Genitourinary:  Genitourinary Comments: labial/perineal redness improving  Musculoskeletal: She exhibits no edemaor tenderness.  Neuro: Ongoing flexor tone in UE's with rhythmic movements which can break when she voluntarily moves limbs/dyskinesias noted, pill rolling RUE persistent. LE's with hamstring tightness LLE, extensor tone RLE.  Can at times relax the limbs  She appears distracted this morning.  Skin: a few scattered abrasions Psych: flat  Assessment/Plan: 1. Functional and mobility deficits secondary to recent  [place] encephalitis and subsequent pneumonia/bronchitis/respiratory sequelae which require 3+ hours per day of interdisciplinary therapy in a comprehensive inpatient rehab setting. Physiatrist is providing close team supervision and 24 hour management of active medical problems listed below. Physiatrist and rehab team continue to assess barriers to discharge/monitor patient progress toward functional and medical goals.  Function:  Bathing Bathing position Bathing activity did not occur: Safety/medical concerns (total A bed level night bath)    Bathing parts      Bathing assist        Upper Body Dressing/Undressing Upper body dressing   What is the patient wearing?: Pull over shirt/dress, Bra   Bra - Perfomed by helper: Thread/unthread right bra strap, Thread/unthread left bra strap, Hook/unhook bra (pull down sports bra)   Pull over shirt/dress - Perfomed by helper: Thread/unthread right sleeve, Thread/unthread left sleeve, Put head through opening, Pull shirt over trunk        Upper body assist Assist Level: 2 helpers      Lower Body Dressing/Undressing Lower body dressing   What is the patient wearing?: Pants, Socks, Shoes       Pants- Performed by helper: Thread/unthread right pants leg, Pull pants up/down, Fasten/unfasten pants, Thread/unthread left pants leg       Socks - Performed by helper: Don/doff right sock, Don/doff left sock   Shoes - Performed by helper: Don/doff right shoe, Don/doff left shoe, Fasten right, Fasten left          Lower body assist Assist for lower body dressing: 2  Helpers      Naval architect activity did not occur: No continent bowel/bladder event   Toileting steps completed by helper: Adjust clothing prior to toileting, Performs perineal hygiene, Adjust clothing after toileting    Toileting assist Assist level: Two helpers   Transfers Chair/bed transfer   Chair/bed transfer method: Squat pivot Chair/bed transfer  assist level: 2 helpers Chair/bed transfer assistive device: Mechanical lift Mechanical lift: Maximove   Locomotion Ambulation Ambulation activity did not occur: Safety/medical Editor, commissioning activity did not occur: N/A Type: Manual (dependent in tilt in space chair )      Cognition Comprehension Comprehension assist level: Understands basic 50 - 74% of the time/ requires cueing 25 - 49% of the time  Expression Expression assist level: Expresses basis less than 25% of the time/requires cueing >75% of the time.  Social Interaction Social Interaction assist level: Interacts appropriately less than 25% of the time. May be withdrawn or combative.  Problem Solving Problem solving assist level: Solves basic less than 25% of the time - needs direction nearly all the time or does not effectively solve problems and may need a restraint for safety  Memory Memory assist level: Recognizes or recalls less than 25% of the time/requires cueing greater than 75% of the time   Medical Problem List and Plan: 1. 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 Honestly not much change from previous HCT 12/17. Doesn't explain clinical change over the summer.  Will have an MRI of brain done over weekend to help explain patient's clinical presentation. Will perform on Sunday as we weill have to sedate her for test. Would like to avoid missing valuable therapy time on weekdays 2. DVT Prophylaxis/Anticoagulation: Pharmaceutical: Lovenox 3. Pain Management: N/A 4. Mood: LCSW to follow for evaluation and support.  5. Neuropsych: This patient is notcapable of making decisions on herown behalf. 6. Skin/Wound Care: Air mattress overlay. Routine pressure relief measures.  -appreciate [place] RN recs 7.  Fluids/Electrolytes/Nutrition: Strict I/O.    -osmolite 1.5 goal rate of 60cc/hrx 20 hr  -prealbumin is 18. Continue to adjust TF to meet caloric needs 8. MRSA tracheobronchitis: pt continues on iv vanc--clinically improved.   abx were initiated 8/31.    -given hyponatremia, stopped bactrim on 9/13 9. Hyponatremia: recurrent  -up to 129 today  -continue IV NS at 75 cc/hr  -reduced H20 through tube  -bmet tomorrow    10. Urinary retention: Monitor voiding with PVR checks --cath for volumes > 350cc 11. HTN: Monitor BP bid. Continue Norvasc 12. H/o constipation:   continue current regimen for now.  13. VDRF: Chest PT bid. ATC for humidification. Suction prn. HOB elevated, aspiration precautions  14. Hypokalemia: now high. Dc supp 15. Dyskinesias/tremors: mysoline   -this appears to be helping. Will add another day-time dose    -now will be on 25 at 0700, 1200 and 50mg  QHS  -valium reduced to 1mg  QHS 16. Breast cancer with hx of lumpectomy  -Appreciate Dr. Virgie Dad follow up. Arimidex to resume once discharged home  LOS (Days) 8 A [place] T, MD 10/08/2016 9:28 AM

## 2016-10-08 NOTE — Evaluation (Signed)
Speech Language Pathology Bedside Swallow Evaluation and Weekly Progress Note  Patient Details  Name: Judy Spears MRN: [id number] Date of Birth: 1950-03-12  SLP Diagnosis: Cognitive Impairments;Speech and Language deficits;Dysphagia;Voice disorder  Rehab Potential: Poor ELOS: 2 weeks    Today's Date: 10/08/2016 SLP Individual Time: 1100-1200 SLP Individual Time Calculation (min): 60 min   Problem List:  Patient Active Problem List   Diagnosis Date Noted  . Severe muscle deconditioning 09/30/2016  . Pressure injury of skin 09/28/2016  . Respiratory failure ([place])   . Tracheobronchitis   . Urinary retention   . PEG (percutaneous endoscopic gastrostomy) status ([place])   . Benign essential HTN   . Hyponatremia   . Hypokalemia   . Reactive hypertension   . Respiratory insufficiency 09/24/2016  . Malignant neoplasm of upper-inner quadrant of left breast in female, estrogen receptor positive ([place]) 07/14/2016  . Heterotopic ossification of bone 03/10/2016  . Acute on chronic respiratory failure with hypoxia ([place])   . Acute hypoxemic respiratory failure ([place])   . Acute blood loss anemia   . Atelectasis 02/04/2016  . Debility 02/03/2016  . Pneumonia of both lower lobes due to Pseudomonas species ([place])   . Pneumonia of both lower lobes due to methicillin susceptible Staphylococcus aureus (MSSA) ([place])   . Acute respiratory failure with hypoxia ([place])   . Acute encephalopathy   . Seizures ([place])   . Sepsis ([place])   . Hypoxia   . Pain   . HCAP (healthcare-associated pneumonia) 12/28/2015  . Chronic respiratory failure ([place]) 12/28/2015  . Acute tracheobronchitis 12/24/2015  . Tracheostomy tube present ([place])   . Tracheal stenosis   . Chronic respiratory failure with hypoxia ([place])   . Spastic tetraplegia ([place]) 10/20/2015  . Tracheostomy status ([place]) 09/26/2015  . Allergic rhinitis 09/26/2015  . 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 Movement disorder 09/22/2015  .  Encephalitis 09/22/2015  . Chest pain 02/07/2014  . Premature atrial contractions 01/13/2014  . Abnormality of gait 12/04/2013  . Hyperlipidemia 12/21/2011  . Cardiovascular risk factor 12/21/2011  . Bunion 01/31/2008  . Metatarsalgia of both feet 09/20/2007  . FLAT FOOT 09/20/2007  . HALLUX RIGIDUS, ACQUIRED 09/20/2007   Past Medical History:  Past Medical History:  Diagnosis Date  . Arthritis    lt great toe  . Complication of anesthesia    pt has had headaches post op-did advise to hydra well preop  . Hair loss    right-sided  . History of exercise stress test    a. ETT (12/15) with 12:00 exercise, no ST changes, occasional PACs.   . Hyperlipidemia   . Insomnia   . Migraine headache   . Movement disorder    resltess in left legs  . Postmenopausal   . 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 Premature atrial contractions    a. Holter (12/15) with 8% PACs, no atrial runs or atrial fibrillation.    Past Surgical History:  Past Surgical History:  Procedure Laterality Date  . BREAST BIOPSY  01/01/2011   Procedure: BREAST BIOPSY WITH NEEDLE LOCALIZATION;  Surgeon: Rolm Bookbinder, MD;  Location: [place];  Service: General;  Laterality: Left;  left breast wire localization biopsy  . BREAST BIOPSY Right 07/27/2012  . BREAST BIOPSY Right 07/07/2016  . BREAST BIOPSY Left 06/30/2016  . cataracts right eye    . CESAREAN SECTION  1986  . HERNIA REPAIR  2000   RIH  . IR [place] TUBE PERCUT W/FLUORO  09/29/2016  . opirectinal membrane  peel    . PEG PLACEMENT    . RHINOPLASTY  1983  . TRACHEOSTOMY      Assessment / Plan / Recommendation Clinical Impression Pt is currently NPO but previous to this admission pt was consuming pleasure food of puree and thin liquids (per family report). PO trials will not be attempted during this admission however, pt would benefit from oral stimulation to facilitate swallow initiation to prevent disuse atrophy. Pt with significant  oral motor deficits that affect not only her speech intelligibility but also contribute to difficulty containing salvia and preventing anterior spillage as well as base of tongue retraction and bitting on swab. Pt unable to exlicit swallow on demand and given oral stimulation, pt unable to produce swallow despite effort seen at neck. However no hyolaryngeal excursion felt and salvia was contained without oral cavity. Pt with intermittent gag reflex when stron stimulus provided to back of throat. Recommend addressing swallow initiation within context of oral stimulation only following oral care. Will add appropriate goals to POC.    Skilled Therapeutic Interventions          Skilled treatment session focused on completion of BSE. In addition, SLP facilitated session by providing Max A cues to vocalize during basic speech task including singing, counting, voicing during non speech activities such as blowing kisses, naming political figures and talking with daughter on face time. Pt able to achieve voicing for ~ 50% of output but was only able to achieve ~ 25% intelligibility. Pt tolerated PMSV during entire session ~ 60 Minutes without any s/s of respiratory distress. Husband present and goals/POC discussed. In addition, education provided on adding some swallow initiation goals to prevent disuse atrophy. Husband in agreement with current goals.    WEEKLY PROGRESS Pt with overall progress this reporting peirod as evidenced by meeting 2 STGs. Pt able to maintain arousal and tolerate PMSV during all waking hours. Additionally pt with progress towards vocalizing. Pt continues to require Skilled ST to address the goals below, increase communicative ability and decreased caregiver burden.    SLP Assessment  Patient will need skilled Speech Lanaguage Pathology Services during CIR admission    Recommendations  Patient may use Passy-Muir Speech Valve: During all therapies with supervision;Caregiver trained to  provide supervision;During all waking hours (remove during sleep) PMSV Supervision: Full SLP Diet Recommendations: NPO Medication Administration: Via alternative means Oral Care Recommendations: Oral care QID Patient destination: Home Follow up Recommendations: 24 hour supervision/assistance;Outpatient SLP;Home Health SLP Equipment Recommended: To be determined    SLP Frequency 3 to 5 out of 7 days   SLP Duration  SLP Intensity  SLP Treatment/Interventions 2 weeks  Minumum of 1-2 x/day, 30 to 90 minutes  Cognitive remediation/compensation;Cueing hierarchy;Functional tasks;Patient/family education;Speech/Language facilitation;Dysphagia/aspiration precaution training    Pain Pain Assessment Pain Assessment: Faces Faces Pain Scale: No hurt  Prior Functioning    Function:    Cognition Comprehension Comprehension assist level: Understands basic 50 - 74% of the time/ requires cueing 25 - 49% of the time  Expression   Expression assist level: Expresses basis less than 25% of the time/requires cueing >75% of the time.;Expresses basic 25 - 49% of the time/requires cueing 50 - 75% of the time. Uses single words/gestures.  Social Interaction Social Interaction assist level: Interacts appropriately less than 25% of the time. May be withdrawn or combative.  Problem Solving Problem solving assist level: Solves basic less than 25% of the time - needs direction nearly all the time or does not effectively solve  problems and may need a restraint for safety  Memory Memory assist level: Recognizes or recalls less than 25% of the time/requires cueing greater than 75% of the time   Short Term Goals: Week 2: SLP Short Term Goal 1 (Week 2): Pt will maintain arousal  in regards to keeping her eyes open for ~ 30 minutes with Min A multimodal cues.  SLP Short Term Goal 2 (Week 2): Given Max A multimodal cues, pt will increase speech intelligibility at the simple word level to ~25%. SLP Short Term  Goal 3 (Week 2): Given Max A cues, pt with return demonstrate various blowing exercises to increase respiratory support for vocalizations.  SLP Short Term Goal 4 (Week 2): Given Max A multimodal cues and oral stimulation, pt will initiate swallow in 50% of trials.   Refer to Care Plan for Long Term Goals  Recommendations for other services: None   Discharge Criteria: Patient will be discharged from SLP if patient refuses treatment 3 consecutive times without medical reason, if treatment goals not met, if there is a change in medical status, if patient makes no progress towards goals or if patient is discharged from hospital.  The above assessment, treatment plan, treatment alternatives and goals were discussed and mutually agreed upon: by patient and by family  Clorinda Wyble 10/08/2016, 2:13 PM

## 2016-10-08 NOTE — Progress Notes (Signed)
Went into pt's chart for MD that was doing a round to note reasoning on contact.

## 2016-10-08 NOTE — Progress Notes (Signed)
Physical Therapy Weekly Progress Note  Patient Details  Name: Judy Spears MRN: [id number] Date of Birth: Feb 18, 1950  Beginning of progress report period: October 01, 2016 End of progress report period: October 08, 2016  Today's Date: 10/08/2016 PT Individual Time: 1300-1440 PT Individual Time Calculation (min): 100 min   Patient has met 4 of 5 short term goals.  Pt currently requires max/totalA overall. Performing transfers with stedy and maxA +2, sit <>stand with maxA +2, and demonstrating improving standing tolerance on tilt table and standing frame for PROM, BLE weight bearing, and upright tolerance. Pt's husband and caregiver actively involved in rehab process and attend most therapy sessions for ongoing education.   Patient continues to demonstrate the following deficits muscle weakness, muscle joint tightness and muscle paralysis, decreased cardiorespiratoy endurance and decreased oxygen support, impaired timing and sequencing, abnormal tone, unbalanced muscle activation, motor apraxia, ataxia, decreased coordination and decreased motor planning, decreased visual acuity and decreased visual perceptual skills, decreased initiation, decreased problem solving, decreased safety awareness and delayed processing, central origin vestibular impairments, and decreased sitting balance, decreased standing balance, decreased postural control and decreased balance strategies and therefore will continue to benefit from skilled PT intervention to increase functional independence with mobility.  Patient progressing toward long term goals..  Continue plan of care.  PT Short Term Goals Week 1:  PT Short Term Goal 1 (Week 1): Pt will roll with max assist +2 PT Short Term Goal 1 - Progress (Week 1): Met PT Short Term Goal 2 (Week 1): Pt will supine>sit with max assist +2 PT Short Term Goal 2 - Progress (Week 1): Met PT Short Term Goal 3 (Week 1): Pt will sit<>stand with max assist +2  PT Short Term Goal  3 - Progress (Week 1): Met PT Short Term Goal 4 (Week 1): Pt will tolerate sitting up in chair for 4 hrs a day PT Short Term Goal 4 - Progress (Week 1): Met PT Short Term Goal 5 (Week 1): Pt will sit with UE support for 1 min with min guard  PT Short Term Goal 5 - Progress (Week 1): Progressing toward goal Week 2:  PT Short Term Goal 1 (Week 2): =LTG due to estimated LOS  Skilled Therapeutic Interventions/Progress Updates: Pt received seated in w/c with caregiver present, denies pain at rest and agreeable to treatment. Stedy transfer w/c>standard w/c with specialty back rest with maxA +2. Pt with poor alignment in w/c, L lateral lean and translation of hips to R side. Pt ultimately does not tolerate chair, vocalizing discomfort with increased respiratory rate. Returned to tilt in space w/c with totalA. Transfer w/c <>mat table totalA squat pivot. Sitting balance with wedge under R hip, occasional R elbow weight bearing for L trunk stretching. Performed dynamic BUE reaching to retrieve bean bags with minA; unable to release beanbags on command due to increased intention tremor. Sit <>stand x6 reps from mat table maxA with improving initiation noted. Standing frame performed x10 min with increasing standing posture with time and PROM to BLE. Returned to room totalA, remained in w/c with all needs in reach and caregiver present.     Therapy Documentation Precautions:  Precautions Precautions: Fall Precaution Comments: Trach, PEG Restrictions Weight Bearing Restrictions: No Vital Signs: Therapy Vitals Temp: 98.1 F (36.7 C) Temp Source: Axillary Pulse Rate: 81 Resp: 18 BP: 130/60 Patient Position (if appropriate): Lying Oxygen Therapy SpO2: 100 % O2 Device: Tracheostomy Collar O2 Flow Rate (L/min): 5 L/min FiO2 (%): 28 %  See Function Navigator for Current Functional Status.  Therapy/Group: Individual Therapy  Luberta Mutter 10/08/2016, 7:18 AM

## 2016-10-08 NOTE — Progress Notes (Signed)
Occupational Therapy Weekly Progress Note  Patient Details  Name: Judy Spears MRN: [id number] Date of Birth: [birth year]  Beginning of progress report period: October 01, 2016 End of progress report period: October 08, 2016  Today's Date: 10/08/2016 OT Individual Time: 1000-1100 OT Individual Time Calculation (min): 60 min    Patient has met 3 of 4 short term goals.  Pt currently requires +2 for use of STEDY due to strong L lean in standing/ sitting position, requiring physical assist to maintain position on STEDY. Pt making steady progress towards OT goals. She cont to require total - +2 assist for all ADLs and max- total A for functional transfers. Pt is now tolerating more upright positioning as we cont to work on pt's midline sitting orientation and neuro re-ed in order to decrease tone/spasticity in B UEs/ LEs L>R. Pt's husband and personal care attendant have been present for all tx sessions and cont to be heavily involved in pt's care and ensuring to learn as much as possible for planned d/c home.   Patient continues to demonstrate the following deficits: abnormal posture, ataxia, cognitive deficits, disturbance of vision and muscle weakness (generalized) and therefore will continue to benefit from skilled OT intervention to enhance overall performance with Reduce care partner burden.  Patient progressing toward long term goals..  Continue plan of care.  Have added UB dressing goal in order to increase pt's participation with ADLs. See POC for goal details.   OT Short Term Goals Week 1:  OT Short Term Goal 1 (Week 1): Pt will complete STEDY transfer with max A +1 in order to decrease caregiver burden OT Short Term Goal 1 - Progress (Week 1): Not met OT Short Term Goal 2 (Week 1): Pt will complete sliding board transfer to therapy mat with +2 assist in prep for functional transfer OT Short Term Goal 2 - Progress (Week 1): Met OT Short Term Goal 3 (Week 1): Pt will maintain static  sitting balance with feet supported with mod A in prep for functional sitting on BSC. OT Short Term Goal 3 - Progress (Week 1): Met OT Short Term Goal 4 (Week 1): Pt will tolerate UE PROM HEP performed by caregivers OT Short Term Goal 4 - Progress (Week 1): Met Week 2:  OT Short Term Goal 1 (Week 2): Pt will thread one UE into shirt with total A set-up and VCs with min A in order to increase participation with ADLs OT Short Term Goal 2 (Week 2): Pt will tolerate sitting on BSC with supervision once positioned as necessary to increase safety with toileting task OT Short Term Goal 3 (Week 2): Pt will complete stand pivot transfer with toal A +1 in prep for functional transfers  Skilled Therapeutic Interventions/Progress Updates:    Pt seen for OT session focusing on functional sitting balance/ tolerance during ADL task. Pt in supine upon arrival with personal care attendant and husband present, pt denying piain and agreeable to tx session. Pants donned total A +}2 in bed, pt able to reach for bed rails and maintain grasp in order to assist with bed mobility. Max A +1 to come sitting EOB. STEDY used to transfer pt to standard upright chair with arm support. Pt noted to have L LE in flexor position during STEDY transfer, standing completely just on R LE.  Feet placed on trash can for increased stability, pt initially requiring max A to maintain position on trash can due to spasms/ tremors in B LEs, however, once  positioned and pt calmed she maintained foot position independently. Also positioned pillows on pt's L side to facilitate midline orientation due to pt's strong L lean. She tolerated upright midline sitting well for ~45 minutes until end of session.  While seated in chair, therapist assisted with washing and styling pt's hair. While washing, worked on verbalizing at one word level, pt intelligible ~75% of time following one word cuing from therapist. She was also able to answer her birth date and  husband's birth date.  Completed UB dressing while up in chair, pt able to assist with moving B UEs to thread through shirt and throughout session followed one step commands with ~90% accuracy. Pt left in tilt-in-space w/c at end of session with caregivers transporting her to SLP session.   Therapy Documentation Precautions:  Precautions Precautions: Fall Precaution Comments: Trach, PEG Restrictions Weight Bearing Restrictions: No Pain :   No/denies pain  See Function Navigator for Current Functional Status.   Therapy/Group: Individual Therapy  [name], Ardon Franklin C 10/08/2016, 6:43 AM

## 2016-10-09 ENCOUNTER — Inpatient Hospital Stay (HOSPITAL_COMMUNITY): Payer: Medicare Other

## 2016-10-09 ENCOUNTER — Inpatient Hospital Stay (HOSPITAL_COMMUNITY): Payer: Medicare Other | Admitting: Physical Therapy

## 2016-10-09 LAB — GLUCOSE, CAPILLARY
GLUCOSE-CAPILLARY: 109 mg/dL — AB (ref 65–99)
GLUCOSE-CAPILLARY: 128 mg/dL — AB (ref 65–99)
GLUCOSE-CAPILLARY: 144 mg/dL — AB (ref 65–99)
Glucose-Capillary: 137 mg/dL — ABNORMAL HIGH (ref 65–99)

## 2016-10-09 LAB — BASIC METABOLIC PANEL
Anion gap: 8 (ref 5–15)
BUN: 8 mg/dL (ref 6–20)
CALCIUM: 8.9 mg/dL (ref 8.9–10.3)
CO2: 28 mmol/L (ref 22–32)
Chloride: 95 mmol/L — ABNORMAL LOW (ref 101–111)
Creatinine, Ser: <0.3 mg/dL — ABNORMAL LOW (ref 0.44–1.00)
Glucose, Bld: 138 mg/dL — ABNORMAL HIGH (ref 65–99)
Potassium: 3.7 mmol/L (ref 3.5–5.1)
Sodium: 131 mmol/L — ABNORMAL LOW (ref 135–145)

## 2016-10-09 MED ORDER — FREE WATER
200.0000 mL | Freq: Three times a day (TID) | Status: DC
  Administered 2016-10-09 – 2016-10-10 (×4): 200 mL

## 2016-10-09 NOTE — Progress Notes (Signed)
[facility] PHYSICAL MEDICINE & REHABILITATION     PROGRESS NOTE    Subjective/Complaints: Husband at bedside, reading the paper to patient  ROS: pt denies nausea, vomiting, diarrhea, cough, shortness of breath or chest pain   Objective: Vital Signs: Blood pressure (!) 153/95, pulse 85, temperature 97.9 F (36.6 C), temperature source Axillary, resp. rate 18, height 5\' 2"  (1.575 m), weight 40.4 kg (88 lb 15.6 oz), last menstrual period 12/01/2010, SpO2 97 %. No results found. No results for input(s): WBC, HGB, HCT, PLT in the last 72 hours.  Recent Labs  10/08/16 0822 10/09/16 0616  NA 129* 131*  K 3.3* 3.7  CL 93* 95*  GLUCOSE 88 138*  BUN 9 8  CREATININE 0.32* <0.30*  CALCIUM 8.8* 8.9   CBG (last 3)   Recent Labs  10/08/16 2342 10/09/16 0604 10/09/16 1221  GLUCAP 139* 144* 109*    Wt Readings from Last 3 Encounters:  10/09/16 40.4 kg (88 lb 15.6 oz)  09/30/16 44.2 kg (97 lb 6.4 oz)  07/14/16 46.5 kg (102 lb 9.6 oz)    Physical Exam:  Constitutional:   Remains cachectic. No distress.  HENT:  Head: NCAT Mouth/Throat: Oropharynx is clear but dry  Eyes: Pupils are equal, round, and reactive to light. Conjunctivaeare normal.  Neck: minimal secretions from trach, tolerates. Passy Muir valve   . Cardiovascular: RRR without murmur. No JVD  .    Respiratory: occasional stridor.   GI: Soft. Bowel sounds are normal. She exhibits no distension. There is no tenderness.   Musculoskeletal: She exhibits no edemaor tenderness.  Neuro: Ongoing flexor tone in UE's with rhythmic movements which can break when she voluntarily moves limbs/dyskinesias noted, pill rolling RUE persistent. LE's with hamstring tightness LLE, extensor tone RLE.  Can at times relax the limbs  She appears distracted this morning.  Skin: a few scattered abrasions Psych: flat  Assessment/Plan: 1. Functional and mobility deficits secondary to recent [place] encephalitis and subsequent  pneumonia/bronchitis/respiratory sequelae which require 3+ hours per day of interdisciplinary therapy in a comprehensive inpatient rehab setting. Physiatrist is providing close team supervision and 24 hour management of active medical problems listed below. Physiatrist and rehab team continue to assess barriers to discharge/monitor patient progress toward functional and medical goals.  Function:  Bathing Bathing position Bathing activity did not occur: Safety/medical concerns (total A bed level night bath) Position: Bed  Bathing parts   Body parts bathed by helper: Right arm, Left arm, Chest, Abdomen, Front perineal area, Buttocks, Right upper leg, Left upper leg, Right lower leg, Left lower leg, Back  Bathing assist Assist Level: 2 helpers      Upper Body Dressing/Undressing Upper body dressing   What is the patient wearing?: Pull over shirt/dress, Bra   Bra - Perfomed by helper: Thread/unthread right bra strap, Thread/unthread left bra strap, Hook/unhook bra (pull down sports bra)   Pull over shirt/dress - Perfomed by helper: Thread/unthread right sleeve, Thread/unthread left sleeve, Put head through opening, Pull shirt over trunk        Upper body assist Assist Level: 2 helpers      Lower Body Dressing/Undressing Lower body dressing   What is the patient wearing?: Pants, Socks, Shoes       Pants- Performed by helper: Thread/unthread right pants leg, Pull pants up/down, Fasten/unfasten pants, Thread/unthread left pants leg       Socks - Performed by helper: Don/doff right sock, Don/doff left sock   Shoes - Performed by helper: Don/doff right  shoe, Don/doff left shoe, Fasten right, Fasten left          Lower body assist Assist for lower body dressing: 2 Helpers      Naval architect activity did not occur: No continent bowel/bladder event   Toileting steps completed by helper: Adjust clothing prior to toileting, Performs perineal hygiene, Adjust  clothing after toileting    Toileting assist Assist level: Two helpers   Transfers Chair/bed transfer   Chair/bed transfer method: Other (Stedy) Chair/bed transfer assist level: 2 helpers Chair/bed transfer assistive device: Facilities manager lift: Architectural technologist activity did not occur: Safety/medical Editor, commissioning activity did not occur: N/A Type: Manual (dependent in tilt in space chair )      Cognition Comprehension Comprehension assist level: Understands basic 50 - 74% of the time/ requires cueing 25 - 49% of the time  Expression Expression assist level: Expresses basis less than 25% of the time/requires cueing >75% of the time., Expresses basic 25 - 49% of the time/requires cueing 50 - 75% of the time. Uses single words/gestures.  Social Interaction Social Interaction assist level: Interacts appropriately less than 25% of the time. May be withdrawn or combative.  Problem Solving Problem solving assist level: Solves basic less than 25% of the time - needs direction nearly all the time or does not effectively solve problems and may need a restraint for safety  Memory Memory assist level: Recognizes or recalls less than 25% of the time/requires cueing greater than 75% of the time   Medical Problem List and Plan: 1. 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 Honestly not much change from previous HCT 12/17. Doesn't explain clinical change over the summer.  Will have an MRI of brain done over weekend to help explain patient's clinical presentation. Will perform on Sunday as we weill have to sedate her for test. Would like to avoid missing valuable therapy time on weekdays 2. DVT Prophylaxis/Anticoagulation: Pharmaceutical:  Lovenox 3. Pain Management: N/A 4. Mood: LCSW to follow for evaluation and support.  5. Neuropsych: This patient is notcapable of making decisions on herown behalf. 6. Skin/Wound Care: Air mattress overlay. Routine pressure relief measures.  -appreciate [facility] RN recs 7. Fluids/Electrolytes/Nutrition: Strict I/O.    -osmolite 1.5 goal rate of 60cc/hrx 20 hr  -prealbumin is 18. Continue to adjust TF to meet caloric needs 8. MRSA tracheobronchitis: pt continues on iv vanc--clinically improved.   abx were initiated 8/31.    -given hyponatremia, stopped bactrim on 9/13 9. Hyponatremia: recurrent  -up to 131 today  -May discontinue IV normal saline, increased free water to 200 mL 3 times a day sodium continues to improve    -bmet 9/17    10. Urinary retention: Monitor voiding with PVR checks --cath for volumes > 350cc 11. HTN: Monitor BP bid. Continue Norvasc 12. H/o constipation:   continue current regimen for now.  13. VDRF: Chest PT bid. ATC for humidification. Suction prn. HOB elevated, aspiration precautions  14. Hypokalemia: Improved, potassium normal at 3.7 today 15. Dyskinesias/tremors: mysoline   -this appears to be helping. Will add another day-time dose    -now will be on 25 at 0700, 1200 and 50mg  QHS  -valium reduced to 1mg  QHS 16. Breast cancer with hx of lumpectomy  -Appreciate Dr.  Magrinat's follow up. Arimidex to resume once discharged home  LOS (Days) [place] E, MD 10/09/2016 2:08 PM

## 2016-10-09 NOTE — Progress Notes (Signed)
Physical Therapy Note  Patient Details  Name: Judy Spears MRN: [id number] Date of Birth: [birth year] Today's Date: 10/09/2016    Time: 1000-1030 30 minutes  1:1 No signs/symptoms of pain.  Session focused to strengthening and activity tolerance as pt performed standing in standing frame x 11 minutes.  During standing task focus on upright posture and activating trunk muscles.  SLP present and worked on breathing and voice.  Pt with improving mm activation in postural muscles throughout session.   Taz Vanness 10/09/2016, 10:30 AM

## 2016-10-10 DIAGNOSIS — Z9889 Other specified postprocedural states: Secondary | ICD-10-CM

## 2016-10-10 LAB — GLUCOSE, CAPILLARY
GLUCOSE-CAPILLARY: 139 mg/dL — AB (ref 65–99)
GLUCOSE-CAPILLARY: 121 mg/dL — AB (ref 65–99)
Glucose-Capillary: 109 mg/dL — ABNORMAL HIGH (ref 65–99)
Glucose-Capillary: 150 mg/dL — ABNORMAL HIGH (ref 65–99)

## 2016-10-10 MED ORDER — FREE WATER
100.0000 mL | Freq: Three times a day (TID) | Status: DC
  Administered 2016-10-10 – 2016-10-19 (×38): 100 mL

## 2016-10-10 NOTE — Progress Notes (Signed)
[place] PHYSICAL MEDICINE & REHABILITATION     PROGRESS NOTE    Subjective/Complaints: Husband at bedside, we discussed MRI results. While the study was suboptimal, there is no evidence on diffusion-weighted images of a recent stroke, no evidence of metastasis from breast carcinoma, there was atrophy noted in frontal/parietal lobes as well as cerebellar hemispheres  Husband states that he has comparison films available.  Patient tolerating tube feedings, no breathing problems noted, ROS: pt denies nausea, vomiting, diarrhea, cough, shortness of breath or chest pain   Objective: Vital Signs: Blood pressure (!) 145/90, pulse 86, temperature (!) 97.3 F (36.3 C), temperature source Oral, resp. rate 18, height 5\' 2"  (1.575 m), weight 40.5 kg (89 lb 4.5 oz), last menstrual period 12/01/2010, SpO2 97 %. Mr Brain Wo Contrast  Result Date: 10/09/2016 CLINICAL DATA:  Unexplained altered level of consciousness. History of encephalitis. EXAM: MRI HEAD WITHOUT CONTRAST TECHNIQUE: Multiplanar, multiecho pulse sequences of the brain and surrounding structures were obtained without intravenous contrast. COMPARISON:  Head CT 10/04/2016 FINDINGS: Brain: The study suffers from motion degradation. Diffusion imaging does not show any acute or subacute infarction. However, with this degree of motion, a small infarction could be inapparent. There is chronic cerebellar more than cerebral atrophy. Cerebral hemispheres show atrophy and chronic appearing small vessel ischemic changes of the white matter. No evidence of mass lesion, hydrocephalus or extra-axial collection. Scattered foci of susceptibility artifact are noted bilaterally. These are most consistent with foci of hemosiderin deposition secondary to distant insults. Vascular: Major vessels at the base of the brain show flow. Skull and upper cervical spine: Negative Sinuses/Orbits: Clear/normal Other: None significant IMPRESSION: Severely motion degraded exam.  No acute infarction is seen, but sensitivity is limited. Atrophy and moderate chronic appearing small vessel ischemic changes. Scattered foci of susceptibility artifact that could represent areas of hemosiderin deposition related to old micro hemorrhagic infarctions or other hemorrhagic brain insults. Electronically Signed   By: Nelson Chimes M.D.   On: 10/09/2016 15:30   No results for input(s): WBC, HGB, HCT, PLT in the last 72 hours.  Recent Labs  10/08/16 0822 10/09/16 0616  NA 129* 131*  K 3.3* 3.7  CL 93* 95*  GLUCOSE 88 138*  BUN 9 8  CREATININE 0.32* <0.30*  CALCIUM 8.8* 8.9   CBG (last 3)   Recent Labs  10/09/16 2331 10/10/16 0554 10/10/16 1210  GLUCAP 128* 109* 139*    Wt Readings from Last 3 Encounters:  10/10/16 40.5 kg (89 lb 4.5 oz)  09/30/16 44.2 kg (97 lb 6.4 oz)  07/14/16 46.5 kg (102 lb 9.6 oz)    Physical Exam:  Constitutional:   Remains cachectic. No distress.  HENT:  Head: NCAT Mouth/Throat: Oropharynx is clear but dry  Eyes: Pupils are equal, round, and reactive to light. Conjunctivaeare normal.  Neck: minimal secretions from trach, tolerates. Passy Muir valve   . Cardiovascular: RRR without murmur. No JVD  .    Respiratory: occasional stridor.   GI: Soft. Bowel sounds are normal. She exhibits no distension. There is no tenderness.   Musculoskeletal: She exhibits no edemaor tenderness.  Neuro: Ongoing flexor tone in UE's with rhythmic movements which can break when she voluntarily moves limbs/dyskinesias noted, pill rolling RUE persistent. LE's with hamstring tightness LLE, extensor tone RLE.  Left upper extremity tone, Ashworth for elbow flexors  She appears distracted this morning.  Skin: a few scattered abrasions Psych: flat  Assessment/Plan: 1. Functional and mobility deficits secondary to recent [place] encephalitis  and subsequent pneumonia/bronchitis/respiratory sequelae which require 3+ hours per day of interdisciplinary therapy in  a comprehensive inpatient rehab setting. Physiatrist is providing close team supervision and 24 hour management of active medical problems listed below. Physiatrist and rehab team continue to assess barriers to discharge/monitor patient progress toward functional and medical goals.  Function:  Bathing Bathing position Bathing activity did not occur: Safety/medical concerns (total A bed level night bath) Position: Bed  Bathing parts   Body parts bathed by helper: Right arm, Left arm, Chest, Abdomen, Front perineal area, Buttocks, Right upper leg, Left upper leg, Right lower leg, Left lower leg, Back  Bathing assist Assist Level: 2 helpers      Upper Body Dressing/Undressing Upper body dressing   What is the patient wearing?: Pull over shirt/dress, Bra   Bra - Perfomed by helper: Thread/unthread right bra strap, Thread/unthread left bra strap, Hook/unhook bra (pull down sports bra)   Pull over shirt/dress - Perfomed by helper: Thread/unthread right sleeve, Thread/unthread left sleeve, Put head through opening, Pull shirt over trunk        Upper body assist Assist Level: 2 helpers      Lower Body Dressing/Undressing Lower body dressing   What is the patient wearing?: Pants, Socks, Shoes       Pants- Performed by helper: Thread/unthread right pants leg, Pull pants up/down, Fasten/unfasten pants, Thread/unthread left pants leg       Socks - Performed by helper: Don/doff right sock, Don/doff left sock   Shoes - Performed by helper: Don/doff right shoe, Don/doff left shoe, Fasten right, Fasten left          Lower body assist Assist for lower body dressing: 2 Automotive engineer activity did not occur: No continent bowel/bladder event   Toileting steps completed by helper: Adjust clothing prior to toileting, Performs perineal hygiene, Adjust clothing after toileting    Toileting assist Assist level: Two helpers   Transfers Chair/bed transfer    Chair/bed transfer method: Other (Stedy) Chair/bed transfer assist level: 2 helpers Chair/bed transfer assistive device: Facilities manager lift: Architectural technologist activity did not occur: Safety/medical Editor, commissioning activity did not occur: N/A Type: Manual (dependent in tilt in space chair )      Cognition Comprehension Comprehension assist level: Understands basic 50 - 74% of the time/ requires cueing 25 - 49% of the time  Expression Expression assist level: Expresses basis less than 25% of the time/requires cueing >75% of the time.  Social Interaction Social Interaction assist level: Interacts appropriately less than 25% of the time. May be withdrawn or combative.  Problem Solving Problem solving assist level: Solves basic less than 25% of the time - needs direction nearly all the time or does not effectively solve problems and may need a restraint for safety  Memory Memory assist level: Recognizes or recalls less than 25% of the time/requires cueing greater than 75% of the time (nonverbal)   Medical Problem List and Plan: 1. 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 Honestly not much change from previous HCT 12/17. Doesn't explain clinical change over the summer.  Will have an MRI of brain done over weekend to help explain patient's clinical presentation. Will perform on Sunday as we weill have to  sedate her for test. Would like to avoid missing valuable therapy time on weekdays 2. DVT Prophylaxis/Anticoagulation: Pharmaceutical: Lovenox 3. Pain Management: N/A 4. Mood: LCSW to follow for evaluation and support.  5. Neuropsych: This patient is notcapable of making decisions on herown behalf. 6. Skin/Wound Care: Air mattress  overlay. Routine pressure relief measures.  -appreciate [place] RN recs 7. Fluids/Electrolytes/Nutrition: Strict I/O.    -osmolite 1.5 goal rate of 60cc/hrx 20 hr  -prealbumin is 18. Continue to adjust TF to meet caloric needs 8. MRSA tracheobronchitis: pt continues on iv vanc--clinically improved.   abx were initiated 8/31.    -given hyponatremia, stopped bactrim on 9/13 9. Hyponatremia: recurrent  -up to 131 today  -May discontinue IV normal saline, increased free water to 200 mL 3 times a day sodium continues to improve, Reduced down to 100 ml QID, given history    -bmet 9/17    10. Urinary retention: Monitor voiding with PVR checks --cath for volumes > 350cc 11. HTN: Monitor BP bid. Continue Norvasc  12. H/o constipation:   continue current regimen for now.  13. VDRF: Chest PT bid. ATC for humidification. Suction prn. HOB elevated, aspiration precautions  14. Hypokalemia: Improved, potassium normal at 3.7, repeat on 9/17 15. Dyskinesias/tremors: mysoline   -this appears to be helping. Will add another day-time dose    -now will be on 25 at 0700, 1200 and 50mg  QHS  -valium reduced to 1mg  QHS 16. Breast cancer with hx of lumpectomy  -Appreciate Dr. Virgie Dad follow up. Arimidex to resume once discharged home  LOS (Days) [place] E, MD 10/10/2016 12:39 PM

## 2016-10-10 NOTE — Plan of Care (Signed)
Problem: RH BOWEL ELIMINATION Goal: RH STG MANAGE BOWEL WITH ASSISTANCE STG Manage Bowel with  Total Assistance.  Outcome: Not Progressing Pt has not had a BM since 10/07/16. Laxative to be given with dinner time meds at request of pt husband  Goal: RH STG MANAGE BOWEL W/MEDICATION W/ASSISTANCE STG Manage Bowel with Medication with total Assistance.  Outcome: Not Progressing Pt has not had a BM since 10/07/16. Laxative to be given with dinner time meds at request of pt husband

## 2016-10-11 ENCOUNTER — Inpatient Hospital Stay (HOSPITAL_COMMUNITY): Payer: Medicare Other | Admitting: Physical Therapy

## 2016-10-11 ENCOUNTER — Inpatient Hospital Stay (HOSPITAL_COMMUNITY): Payer: Medicare Other | Admitting: Speech Pathology

## 2016-10-11 ENCOUNTER — Inpatient Hospital Stay (HOSPITAL_COMMUNITY): Payer: Medicare Other | Admitting: Occupational Therapy

## 2016-10-11 ENCOUNTER — Ambulatory Visit: Payer: Medicare Other | Admitting: Physical Medicine & Rehabilitation

## 2016-10-11 ENCOUNTER — Ambulatory Visit: Payer: Medicare Other | Admitting: Physical Therapy

## 2016-10-11 LAB — BASIC METABOLIC PANEL
Anion gap: 14 (ref 5–15)
BUN: 13 mg/dL (ref 6–20)
CALCIUM: 9.1 mg/dL (ref 8.9–10.3)
CO2: 28 mmol/L (ref 22–32)
Chloride: 88 mmol/L — ABNORMAL LOW (ref 101–111)
Creatinine, Ser: 0.31 mg/dL — ABNORMAL LOW (ref 0.44–1.00)
GFR calc Af Amer: >60 mL/min (ref >60)
GLUCOSE: 133 mg/dL — AB (ref 65–99)
Potassium: 3.5 mmol/L (ref 3.5–5.1)
Sodium: 130 mmol/L — ABNORMAL LOW (ref 135–145)

## 2016-10-11 LAB — GLUCOSE, CAPILLARY
GLUCOSE-CAPILLARY: 123 mg/dL — AB (ref 65–99)
GLUCOSE-CAPILLARY: 162 mg/dL — AB (ref 65–99)
Glucose-Capillary: 146 mg/dL — ABNORMAL HIGH (ref 65–99)
Glucose-Capillary: 117 mg/dL — ABNORMAL HIGH (ref 65–99)
Glucose-Capillary: 159 mg/dL — ABNORMAL HIGH (ref 65–99)

## 2016-10-11 MED ORDER — CLONAZEPAM 0.5 MG PO TABS
0.5000 mg | ORAL_TABLET | Freq: Two times a day (BID) | ORAL | Status: DC | PRN
  Administered 2016-10-11 – 2016-10-12 (×2): 0.5 mg via ORAL
  Filled 2016-10-11 (×2): qty 1

## 2016-10-11 MED ORDER — PRIMIDONE 50 MG PO TABS
100.0000 mg | ORAL_TABLET | Freq: Every day | ORAL | Status: DC
  Administered 2016-10-11 – 2016-10-21 (×11): 100 mg
  Filled 2016-10-11 (×11): qty 2

## 2016-10-11 MED ORDER — PRO-STAT SUGAR FREE PO LIQD
30.0000 mL | Freq: Every day | ORAL | Status: DC
  Administered 2016-10-11 – 2016-10-16 (×6): 30 mL
  Filled 2016-10-11 (×5): qty 30

## 2016-10-11 MED ORDER — CLONAZEPAM 0.1 MG/ML ORAL SUSPENSION
0.5000 mg | Freq: Two times a day (BID) | ORAL | Status: DC | PRN
  Filled 2016-10-11: qty 5

## 2016-10-11 NOTE — Progress Notes (Signed)
[place] PHYSICAL MEDICINE & REHABILITATION     PROGRESS NOTE    Subjective/Complaints: No new issues over this weekend. MRI performed and reviewed with husband.   ROS: Limited due to cognitive/behavioral   Objective: Vital Signs: Blood pressure (!) 142/88, pulse 79, temperature 97.9 F (36.6 C), temperature source Axillary, resp. rate 18, height 5\' 2"  (1.575 m), weight 38.2 kg (84 lb 4.6 oz), last menstrual period 12/01/2010, SpO2 100 %. Mr Brain Wo Contrast  Result Date: 10/09/2016 CLINICAL DATA:  Unexplained altered level of consciousness. History of encephalitis. EXAM: MRI HEAD WITHOUT CONTRAST TECHNIQUE: Multiplanar, multiecho pulse sequences of the brain and surrounding structures were obtained without intravenous contrast. COMPARISON:  Head CT 10/04/2016 FINDINGS: Brain: The study suffers from motion degradation. Diffusion imaging does not show any acute or subacute infarction. However, with this degree of motion, a small infarction could be inapparent. There is chronic cerebellar more than cerebral atrophy. Cerebral hemispheres show atrophy and chronic appearing small vessel ischemic changes of the white matter. No evidence of mass lesion, hydrocephalus or extra-axial collection. Scattered foci of susceptibility artifact are noted bilaterally. These are most consistent with foci of hemosiderin deposition secondary to distant insults. Vascular: Major vessels at the base of the brain show flow. Skull and upper cervical spine: Negative Sinuses/Orbits: Clear/normal Other: None significant IMPRESSION: Severely motion degraded exam. No acute infarction is seen, but sensitivity is limited. Atrophy and moderate chronic appearing small vessel ischemic changes. Scattered foci of susceptibility artifact that could represent areas of hemosiderin deposition related to old micro hemorrhagic infarctions or other hemorrhagic brain insults. Electronically Signed   By: Nelson Chimes M.D.   On: 10/09/2016  15:30   No results for input(s): WBC, HGB, HCT, PLT in the last 72 hours.  Recent Labs  10/09/16 0616 10/11/16 0509  NA 131* 130*  K 3.7 3.5  CL 95* 88*  GLUCOSE 138* 133*  BUN 8 13  CREATININE <0.30* 0.31*  CALCIUM 8.9 9.1   CBG (last 3)   Recent Labs  10/10/16 1840 10/10/16 2353 10/11/16 0610  GLUCAP 121* 150* 146*    Wt Readings from Last 3 Encounters:  10/11/16 38.2 kg (84 lb 4.6 oz)  09/30/16 44.2 kg (97 lb 6.4 oz)  07/14/16 46.5 kg (102 lb 9.6 oz)    Physical Exam:  Constitutional:   Remains cachectic. No distress.  HENT:  Head: NCAT Mouth/Throat: Oropharynx is clear but dry  Eyes: Pupils are equal, round, and reactive to light. Conjunctivaeare normal.  Neck: minimal secretions from trach, tolerates. Passy Muir valve   . Cardiovascular: RRR without murmur. No JVD  .    Respiratory: no distress. Some scattered rhonchi.   GI: Soft. Bowel sounds are normal. She exhibits no distension. There is no tenderness.   Musculoskeletal: She exhibits no edemaor tenderness.  Neuro: fairly alertOngoing flexor tone in UE's with rhythmic movements /dyskinesias noted, pill rolling RUE persistent. LE's with hamstring tightness LLE, extensor tone RLE.  Left upper extremity tone, Ashworth for elbow flexors    Skin: a few scattered abrasions Psych: flat  Assessment/Plan: 1. Functional and mobility deficits secondary to recent [place] encephalitis and subsequent pneumonia/bronchitis/respiratory sequelae which require 3+ hours per day of interdisciplinary therapy in a comprehensive inpatient rehab setting. Physiatrist is providing close team supervision and 24 hour management of active medical problems listed below. Physiatrist and rehab team continue to assess barriers to discharge/monitor patient progress toward functional and medical goals.  Function:  Bathing Bathing position Bathing activity did not  occur: Safety/medical concerns (total A bed level night  bath) Position: Bed  Bathing parts   Body parts bathed by helper: Right arm, Left arm, Chest, Abdomen, Front perineal area, Buttocks, Right upper leg, Left upper leg, Right lower leg, Left lower leg, Back  Bathing assist Assist Level: 2 helpers      Upper Body Dressing/Undressing Upper body dressing   What is the patient wearing?: Pull over shirt/dress, Bra   Bra - Perfomed by helper: Thread/unthread right bra strap, Thread/unthread left bra strap, Hook/unhook bra (pull down sports bra)   Pull over shirt/dress - Perfomed by helper: Thread/unthread right sleeve, Thread/unthread left sleeve, Put head through opening, Pull shirt over trunk        Upper body assist Assist Level: 2 helpers      Lower Body Dressing/Undressing Lower body dressing   What is the patient wearing?: Pants, Socks, Shoes       Pants- Performed by helper: Thread/unthread right pants leg, Pull pants up/down, Fasten/unfasten pants, Thread/unthread left pants leg       Socks - Performed by helper: Don/doff right sock, Don/doff left sock   Shoes - Performed by helper: Don/doff right shoe, Don/doff left shoe, Fasten right, Fasten left          Lower body assist Assist for lower body dressing: 2 Automotive engineer activity did not occur: No continent bowel/bladder event   Toileting steps completed by helper: Adjust clothing prior to toileting, Performs perineal hygiene, Adjust clothing after toileting    Toileting assist Assist level: Two helpers   Transfers Chair/bed transfer   Chair/bed transfer method: Other (Stedy) Chair/bed transfer assist level: 2 helpers Chair/bed transfer assistive device: Facilities manager lift: Architectural technologist activity did not occur: Safety/medical Editor, commissioning activity did not occur: N/A Type: Manual (dependent in tilt in space chair )      Cognition Comprehension Comprehension  assist level: Understands basic 50 - 74% of the time/ requires cueing 25 - 49% of the time  Expression Expression assist level: Expresses basis less than 25% of the time/requires cueing >75% of the time.  Social Interaction Social Interaction assist level: Interacts appropriately less than 25% of the time. May be withdrawn or combative.  Problem Solving Problem solving assist level: Solves basic less than 25% of the time - needs direction nearly all the time or does not effectively solve problems and may need a restraint for safety  Memory Memory assist level: Recognizes or recalls less than 25% of the time/requires cueing greater than 75% of the time   Medical Problem List and Plan: 1. 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 Neurology to follow up this week 2. DVT Prophylaxis/Anticoagulation: Pharmaceutical: Lovenox 3. Pain Management: N/A 4. Mood: LCSW to follow for evaluation and support.  5. Neuropsych: This patient is notcapable of making decisions on herown behalf. 6. Skin/Wound Care: Air mattress overlay. Routine pressure relief measures.  -appreciate [place] RN recs 7. Fluids/Electrolytes/Nutrition: Strict I/O.    -osmolite 1.5 goal rate of 60cc/hrx 20 hr  -prealbumin is 18. Continue to adjust TF to meet caloric needs 8. MRSA tracheobronchitis: pt continues on iv vanc--clinically improved.  abx were initiated 8/31.    -given hyponatremia, stopped bactrim on 9/13 9. Hyponatremia: recurrent  -at 130 today  -continue free water  100 mL 3 times a day sodium continues to improve,       -bmet 9/17 reviewed    10. Urinary retention: Monitor voiding with PVR checks --cath for volumes > 350cc 11. HTN: Monitor BP bid. Continue  Norvasc  12. H/o constipation:   continue current regimen for now.  13. VDRF: Chest PT bid. ATC for humidification. Suction prn. HOB elevated, aspiration precautions  14. Hypokalemia: Improved, potassium normal at 3.7, repeat on 9/17 15. Dyskinesias/tremors: mysoline   - 25mg  at 0700, 1200 and 50mg  QHS  -valium: reduce to 0.5mg  QHS 16. Breast cancer with hx of lumpectomy  -Appreciate Dr. Virgie Dad follow up. Arimidex to resume once discharged home  LOS (Days) [place] T, MD 10/11/2016 9:03 AM

## 2016-10-11 NOTE — Progress Notes (Signed)
Physical Therapy Session Note  Patient Details  Name: Judy Spears MRN: [id number] Date of Birth: May 31, 1950  Today's Date: 10/11/2016 PT Individual Time: 1300-1400 PT Individual Time Calculation (min): 60 min   Short Term Goals: Week 2:  PT Short Term Goal 1 (Week 2): =LTG due to estimated LOS  Skilled Therapeutic Interventions/Progress Updates:   Pt received sitting up in wheelchair, agreeable to therapy. Pt put in standing frame to facilitate weight bearing through LE, stretching to LE, and midline posture. Difficulty achieving midline and poor patient tolerance. Pt tolerated standing fame ~5 min. Pt total assist for squat pivot chair/mat transfer. Pt min assist for sitting balance with maintaining midline, even with RUE holding onto mat. Pt sidelying over pillow to facilitate stretching of L trunk. Attempted to position/ranged LLE but pt did not tolerate movement. O2 remained WNL throughout session. Occasionally dropped into 80's but returned to normal with cues for deep breathing. Pt left sitting up in wheelchair, hand off to OT. Caregiver present.   Therapy Documentation Precautions:  Precautions Precautions: Fall Precaution Comments: Trach, PEG Restrictions Weight Bearing Restrictions: No   See Function Navigator for Current Functional Status.   Therapy/Group: Individual Therapy  Gwinda Passe, SPT 10/11/2016, 2:54 PM

## 2016-10-11 NOTE — Progress Notes (Signed)
Speech Language Pathology Daily Session Note  Patient Details  Name: Judy Spears MRN: [id number] Date of Birth: 1950-06-18  Today's Date: 10/11/2016 SLP Individual Time: 1100-1130 SLP Individual Time Calculation (min): 30 min  Short Term Goals: Week 2: SLP Short Term Goal 1 (Week 2): Pt will maintain arousal  in regards to keeping her eyes open for ~ 30 minutes with Min A multimodal cues.  SLP Short Term Goal 2 (Week 2): Given Max A multimodal cues, pt will increase speech intelligibility at the simple word level to ~25%. SLP Short Term Goal 3 (Week 2): Given Max A cues, pt with return demonstrate various blowing exercises to increase respiratory support for vocalizations.  SLP Short Term Goal 4 (Week 2): Given Max A multimodal cues and oral stimulation, pt will initiate swallow in 50% of trials.   Skilled Therapeutic Interventions: Skilled treatment session focused on speech communication and swallow initiation goals. SLP received pt upright in her chair with PMSV in place, husband and personal attendant present. Pt visually starring into space, required Total A to shift gaze to SLP at midline. Pt required Total A reminders throughout session to look at SLP. Pt preferred to stare over SLP's shoulder (TV no on and no stimulation in pt's gaze). Pt with pharyngeal wetness, no secretions in trach and no attempt to cough or throat clear to clear. No tracheal secretions noted. Pt tolerated PMSV well with no decline in vitals. SLP repositioned pt multiple times in attempt to regain attention, increase respiratory support. No attempts were successful. Pt unable demonstrate any blowing exercises. No swallow initiated throughout session, oral secretions were lost anteriorally. This Probation officer made nursing aware that pt didn't appear as attentive/engage as previous sessions. Pt left upright in wheelchair with PMSV in place, husband and personal care attendant present. Continue per current plan of care.       Function:    Cognition Comprehension Comprehension assist level: Understands basic less than 25% of the time/ requires cueing >75% of the time;Understands basic 25 - 49% of the time/ requires cueing 50 - 75% of the time  Expression Expression assistive device: Talk trach valve Expression assist level: Expresses basis less than 25% of the time/requires cueing >75% of the time.  Social Interaction Social Interaction assist level: Interacts appropriately less than 25% of the time. May be withdrawn or combative.  Problem Solving Problem solving assist level: Solves basic less than 25% of the time - needs direction nearly all the time or does not effectively solve problems and may need a restraint for safety  Memory Memory assist level: Recognizes or recalls less than 25% of the time/requires cueing greater than 75% of the time    Pain    Therapy/Group: Individual Therapy  Dessirae Scarola 10/11/2016, 11:45 AM

## 2016-10-11 NOTE — Progress Notes (Signed)
Occupational Therapy Session Note  Patient Details  Name: Judy Spears MRN: [id number] Date of Birth: 07-30-1950  Today's Date: 10/11/2016 OT Individual Time: 0962-8366 OT Individual Time Calculation (min): 31 min   Short Term Goals: Week 2:  OT Short Term Goal 1 (Week 2): Pt will thread one UE into shirt with total A set-up and VCs with min A in order to increase participation with ADLs OT Short Term Goal 2 (Week 2): Pt will tolerate sitting on BSC with supervision once positioned as necessary to increase safety with toileting task OT Short Term Goal 3 (Week 2): Pt will complete stand pivot transfer with toal A +1 in prep for functional transfers  Skilled Therapeutic Interventions/Progress Updates:    Tx focus on UE ROM, visual scanning, postural control, functional transfers, and caregiver education during meaningful activities.   Pt greeted supine in bed with RN, NT, and caregiver present. Just had BM this AM and was cathed by RN. 3 helpers for transitioning pt to EOB. OT assisted with placing pts feet on Stedy platform prior to transfer for bilateral LE weightbearing (increased tone in L LE).  Once in w/c. Pt engaging in dancing with OT to meaningful music, working on breaking up UE tone/ROM and psychosocial health. Pt with PMSV in place and attempting to verbalize words to favorite songs. Worked on visual scanning with flowers in room due to pt exhibiting upward gaze preference. Required repositioning frequently in TIS during session due to Lt lean. Educated caregiver on using music for UE ROM/tone mgt at home. At end of tx pt was reclined in TIS, safety belt donned, and caregiver/daughter present.   Therapy Documentation Precautions:  Precautions Precautions: Fall Precaution Comments: Trach, PEG Restrictions Weight Bearing Restrictions: No Vital Signs: Therapy Vitals Pulse Rate: 79 Resp: 18 Oxygen Therapy SpO2: 100 % O2 Device: Tracheostomy Collar O2 Flow Rate (L/min): 5  L/min FiO2 (%): 28 % Pain: No c/o or expressions of pain during tx    ADL:      See Function Navigator for Current Functional Status.   Therapy/Group: Individual Therapy  [name] A Rahkim Rabalais 10/11/2016, 12:29 PM

## 2016-10-11 NOTE — Progress Notes (Signed)
Nutrition Follow-up  DOCUMENTATION CODES:   Severe malnutrition in context of chronic illness, Underweight  INTERVENTION:  Provide Osmolite 1.5 formula via PEG by 10 ml every 4 hours to new goal rate of 60 ml/hr x 20 hours (may hold TF for up to 4 hours for therapy) with 30 ml Prostat once daily to provide 1900 kcal, 90 grams of protein, and 912 ml of water.  Continue free water flushes of 100 ml QID via tube. Total free water: 1312 ml/day.  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 She was admitted on 09/24/16 with lethargy. SOB and low grade fevers due to MRSA tracheobronchitis v/s PNA. PEG tube evaluated by IVR and was exchanged out on 9/5. Patient has had significant decline in mobility and ability to carryout ADL tasks. CIR recommended by rehab team.  Noted weight has been trending down since rehab admission. RD to add Prostat once daily to aid in caloric and protein need to prevent further weight loss. RD to continue to monitor. Labs and medications reviewed.   Diet Order:  Diet NPO time specified  Skin:  Reviewed, no issues  Last BM:  9/17  Height:   Ht Readings from Last 1 Encounters:  09/30/16 '5\' 2"'$  (1.575 m)    Weight:   Wt Readings from Last 1 Encounters:  10/11/16 84 lb 4.6 oz (38.2 kg)    Ideal Body Weight:  50 kg  BMI:  Body mass index is 15.42  kg/m.  Estimated Nutritional Needs:   Kcal:  1700-1900  Protein:  65-75 gram  Fluid:  1.7-1.9 L/day  EDUCATION NEEDS:   No education needs identified at this time  Corrin Parker, MS, RD, LDN Pager # [phone] After hours/ weekend pager # [phone]

## 2016-10-11 NOTE — Progress Notes (Signed)
Occupational Therapy Session Note  Patient Details  Name: Judy Spears MRN: [id number] Date of Birth: 29-Sep-1950  Today's Date: 10/11/2016 OT Individual Time: 1400-1500 OT Individual Time Calculation (min): 60 min    Short Term Goals: Week 2:  OT Short Term Goal 1 (Week 2): Pt will thread one UE into shirt with total A set-up and VCs with min A in order to increase participation with ADLs OT Short Term Goal 2 (Week 2): Pt will tolerate sitting on BSC with supervision once positioned as necessary to increase safety with toileting task OT Short Term Goal 3 (Week 2): Pt will complete stand pivot transfer with toal A +1 in prep for functional transfers  Skilled Therapeutic Interventions/Progress Updates:    Pt seen for OT session focusing on functional sitting balance and upright tolerance. Pt received in tilt-in-space w/c with hand off from PT. Pt more lethargic this session compared to previous sessions, however, nodded "yes" in agreement to tx session and to stay up in w/c at end of session.  Completed functional transfers throughout session with use of STEDY. Required +2 assist for all transfers due to strong L lean, L foot in flexor withdrawal movement, not able to take any weightbearing.  She tolerated ~45 minutes sitting upright on BSC while personal care attendant washed her hair. Therapist worked on positioning for hips/knees/ankles for functional positioning and use of pillows to position pt in midine. Pt initially required mod A for sitting balance, however, progressed to supervision. Pt tolerated upright sitting well, no s/s of distress. O2 remained >95% on 4L supplemental O2. Pt returned to w/c at end of session, left with personal care attendant present.   Therapy Documentation Precautions:  Precautions Precautions: Fall Precaution Comments: Trach, PEG Restrictions Weight Bearing Restrictions: No Pain:   Faces (no pain)  See Function Navigator for Current Functional  Status.   Therapy/Group: Individual Therapy  [name], Karianne Nogueira C 10/11/2016, 7:23 AM

## 2016-10-11 NOTE — Progress Notes (Signed)
Pt seen for trach team follow up. Caregiver noted green discharge from site. Gauze given to replace current dressing. All other necessary equipment at bedside. Will continue to follow for progression.

## 2016-10-12 ENCOUNTER — Inpatient Hospital Stay (HOSPITAL_COMMUNITY): Payer: Medicare Other

## 2016-10-12 ENCOUNTER — Inpatient Hospital Stay (HOSPITAL_COMMUNITY): Payer: Medicare Other | Admitting: Speech Pathology

## 2016-10-12 ENCOUNTER — Inpatient Hospital Stay (HOSPITAL_COMMUNITY): Payer: Medicare Other | Admitting: Occupational Therapy

## 2016-10-12 ENCOUNTER — Inpatient Hospital Stay (HOSPITAL_COMMUNITY): Payer: Medicare Other | Admitting: Physical Therapy

## 2016-10-12 LAB — GLUCOSE, CAPILLARY
GLUCOSE-CAPILLARY: 141 mg/dL — AB (ref 65–99)
Glucose-Capillary: 100 mg/dL — ABNORMAL HIGH (ref 65–99)
Glucose-Capillary: 101 mg/dL — ABNORMAL HIGH (ref 65–99)
Glucose-Capillary: 161 mg/dL — ABNORMAL HIGH (ref 65–99)

## 2016-10-12 NOTE — Progress Notes (Signed)
RT changed inner cannula and water bottle set up. No apparent complications or distress. Patient vitals were stable throughout and currently, patient is resting comfortably, RT will continue to monitor.

## 2016-10-12 NOTE — Progress Notes (Signed)
Occupational Therapy Note  Patient Details  Name: Judy Spears MRN: [id number] Date of Birth: 04-06-1950  Today's Date: 10/12/2016 OT Missed Time: 5 Minutes Missed Time Reason: Nursing care  Pt missed 30 mins of skilled OT 2/2 nursing care. OT will follow up per plan of care.    Judy Spears Judy Spears 10/12/2016, 2:58 PM

## 2016-10-12 NOTE — Progress Notes (Signed)
Speech Language Pathology Daily Session Note  Patient Details  Name: Judy Spears MRN: [id number] Date of Birth: 1950/03/29  Today's Date: 10/12/2016 SLP Individual Time: 1100-1200 SLP Individual Time Calculation (min): 60 min  Short Term Goals: Week 2: SLP Short Term Goal 1 (Week 2): Pt will maintain arousal  in regards to keeping her eyes open for ~ 30 minutes with Min A multimodal cues.  SLP Short Term Goal 2 (Week 2): Given Max A multimodal cues, pt will increase speech intelligibility at the simple word level to ~25%. SLP Short Term Goal 3 (Week 2): Given Max A cues, pt with return demonstrate various blowing exercises to increase respiratory support for vocalizations.  SLP Short Term Goal 4 (Week 2): Given Max A multimodal cues and oral stimulation, pt will initiate swallow in 50% of trials.   Skilled Therapeutic Interventions: Skilled treatment session focused on speech goals. Upon arrival, PMSV was in place and patient tolerated valve throughout session with all vitals remaining WFL. SLP facilitiated session by providing Max A visual, verbal and tactile cues to increase respiratory support in order to maximize voicing during structured tasks. SLP utilized blowing a whistle to maximize auditory feedback and using a straw to blow a kleenex across the table to maximize visual feedback. Patient required Max A multimodal cues to achieve very low voicing during rote singing and counting tasks in 20% of opportunities.  Patient left upright with husband and personal caregiver present. Continue with current plan of care.      Function:   Cognition Comprehension Comprehension assist level: Understands basic 25 - 49% of the time/ requires cueing 50 - 75% of the time  Expression Expression assistive device: Talk trach valve Expression assist level: Expresses basis less than 25% of the time/requires cueing >75% of the time.  Social Interaction Social Interaction assist level: Interacts  appropriately 25 - 49% of time - Needs frequent redirection.  Problem Solving Problem solving assist level: Solves basic less than 25% of the time - needs direction nearly all the time or does not effectively solve problems and may need a restraint for safety  Memory Memory assist level: Recognizes or recalls 25 - 49% of the time/requires cueing 50 - 75% of the time    Pain No/Denies Pain   Therapy/Group: Individual Therapy  Liah Morr 10/12/2016, 2:21 PM

## 2016-10-12 NOTE — Progress Notes (Signed)
[facility] PHYSICAL MEDICINE & REHABILITATION     PROGRESS NOTE    Subjective/Complaints: No new issues over this weekend. MRI performed and reviewed with husband.   ROS: Limited due to cognitive/behavioral   Objective: Vital Signs: Blood pressure 122/75, pulse 78, temperature 98 F (36.7 C), temperature source Oral, resp. rate 16, height 5\' 2"  (1.575 m), weight 38 kg (83 lb 12.4 oz), last menstrual period 12/01/2010, SpO2 99 %. No results found. No results for input(s): WBC, HGB, HCT, PLT in the last 72 hours.  Recent Labs  10/11/16 0509  NA 130*  K 3.5  CL 88*  GLUCOSE 133*  BUN 13  CREATININE 0.31*  CALCIUM 9.1   CBG (last 3)   Recent Labs  10/11/16 2141 10/11/16 2336 10/12/16 0541  GLUCAP 123* 162* 161*    Wt Readings from Last 3 Encounters:  10/12/16 38 kg (83 lb 12.4 oz)  09/30/16 44.2 kg (97 lb 6.4 oz)  07/14/16 46.5 kg (102 lb 9.6 oz)    Physical Exam:  Constitutional:   Remains cachectic. No distress.  HENT:  Head: NCAT Mouth/Throat: Oropharynx is clear but dry  Eyes: Pupils are equal, round, and reactive to light. Conjunctivaeare normal.  Neck: minimal secretions from trach, tolerates. Passy Muir valve   . Cardiovascular: RRR without murmur. No JVD  .    Respiratory: no distress. Some scattered rhonchi.   GI: Soft. Bowel sounds are normal. She exhibits no distension. There is no tenderness.   Musculoskeletal: She exhibits no edemaor tenderness.  Neuro: fairly alertOngoing flexor tone in UE's with rhythmic movements /dyskinesias noted, pill rolling RUE persistent. LE's with hamstring tightness LLE, extensor tone RLE.  Left upper extremity tone, [place] for elbow flexors    Skin: a few scattered abrasions Psych: flat  Assessment/Plan: 1. Functional and mobility deficits secondary to recent [place] encephalitis and subsequent pneumonia/bronchitis/respiratory sequelae which require 3+ hours per day of interdisciplinary therapy in a  comprehensive inpatient rehab setting. Physiatrist is providing close team supervision and 24 hour management of active medical problems listed below. Physiatrist and rehab team continue to assess barriers to discharge/monitor patient progress toward functional and medical goals.  Function:  Bathing Bathing position Bathing activity did not occur: Safety/medical concerns (total A bed level night bath) Position: Bed  Bathing parts   Body parts bathed by helper: Right arm, Left arm, Chest, Abdomen, Front perineal area, Buttocks, Right upper leg, Left upper leg, Right lower leg, Left lower leg, Back  Bathing assist Assist Level: 2 helpers      Upper Body Dressing/Undressing Upper body dressing   What is the patient wearing?: Hospital gown   Bra - Perfomed by helper: Thread/unthread right bra strap, Thread/unthread left bra strap, Hook/unhook bra (pull down sports bra)   Pull over shirt/dress - Perfomed by helper: Thread/unthread right sleeve, Thread/unthread left sleeve, Put head through opening, Pull shirt over trunk        Upper body assist Assist Level: 2 helpers      Lower Body Dressing/Undressing Lower body dressing   What is the patient wearing?: Hospital Gown       Pants- Performed by helper: Thread/unthread right pants leg, Pull pants up/down, Fasten/unfasten pants, Thread/unthread left pants leg       Socks - Performed by helper: Don/doff left sock, Don/doff right sock   Shoes - Performed by helper: Don/doff right shoe, Don/doff left shoe, Fasten right, Fasten left          Lower body assist Assist  for lower body dressing: 2 Helpers      Naval architect activity did not occur: No continent bowel/bladder event   Toileting steps completed by helper: Adjust clothing after toileting, Performs perineal hygiene, Adjust clothing prior to toileting    Toileting assist Assist level: Two helpers   Transfers Chair/bed transfer   Chair/bed transfer  method: Squat pivot Chair/bed transfer assist level: Total assist (Pt < 25%) Chair/bed transfer assistive device: Mechanical lift Mechanical lift: Stedy   Locomotion Ambulation Ambulation activity did not occur: Safety/medical Editor, commissioning activity did not occur: N/A Type: Manual (dependent in tilt in space chair )      Cognition Comprehension Comprehension assist level: Understands basic 25 - 49% of the time/ requires cueing 50 - 75% of the time  Expression Expression assist level: Expresses basis less than 25% of the time/requires cueing >75% of the time.  Social Interaction Social Interaction assist level: Interacts appropriately less than 25% of the time. May be withdrawn or combative.  Problem Solving Problem solving assist level: Solves basic less than 25% of the time - needs direction nearly all the time or does not effectively solve problems and may need a restraint for safety  Memory Memory assist level: Recognizes or recalls less than 25% of the time/requires cueing greater than 75% of the time   Medical Problem List and Plan: 1. 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 Neurology to follow up this week 2. DVT Prophylaxis/Anticoagulation: Pharmaceutical: Lovenox 3. Pain Management: N/A 4. Mood: LCSW to follow for evaluation and support.  5. Neuropsych: This patient is notcapable of making decisions on herown behalf. 6. Skin/Wound Care: Air mattress overlay. Routine pressure relief measures.  -appreciate [place] RN recs 7. Fluids/Electrolytes/Nutrition: Strict I/O.    -osmolite 1.5 goal rate of 60cc/hrx 20 hr  -prealbumin is 18. Continue to adjust TF to meet caloric needs. Still Losing  weight 8. MRSA tracheobronchitis: pt continues on iv vanc--clinically improved.   abx were initiated 8/31.    -given hyponatremia, stopped bactrim on 9/13 9. Hyponatremia: recurrent  -at 130 9/17  -continue free water  100 mL 3 times a day sodium continues to improve,     -recheck bmet on Wednesday    10. Urinary retention: Monitor voiding with PVR checks --cath for volumes > 350cc 11. HTN: Monitor BP bid. Continue Norvasc  12. H/o constipation:   continue current regimen for now.  13. VDRF: Chest PT bid. ATC for humidification. Suction prn. HOB elevated, aspiration precautions  14. Hypokalemia: Improved, potassium normal at 3.5 15. Dyskinesias/tremors: mysoline   - 25mg  at 0700, 1200 and 50mg  QHS  -valium: stopped, prn klonopin 16. Breast cancer with hx of lumpectomy  -Appreciate Dr. Virgie Dad follow up. Arimidex to resume once discharged home  LOS (Days) 12 A [place] T, MD 10/12/2016 8:54 AM

## 2016-10-12 NOTE — Progress Notes (Signed)
Occupational Therapy Session Note  Patient Details  Name: Judy Spears MRN: [id number] Date of Birth: [birth year]  Today's Date: 10/12/2016 OT Individual Time: 1000-1100 OT Individual Time Calculation (min): 60 min    Short Term Goals: Week 2:  OT Short Term Goal 1 (Week 2): Pt will thread one UE into shirt with total A set-up and VCs with min A in order to increase participation with ADLs OT Short Term Goal 2 (Week 2): Pt will tolerate sitting on BSC with supervision once positioned as necessary to increase safety with toileting task OT Short Term Goal 3 (Week 2): Pt will complete stand pivot transfer with toal A +1 in prep for functional transfers Week 3:    Week 4:     Skilled Therapeutic Interventions/Progress Updates:    1:1 Nursing finishing donning [name]'s pants when arrived. Focus on pt initiation of movement to come to EOB. Pt still required max A to come to EOB and then mod A to maintain balance. Continued focus on sitting balance while caregiver, [name] and therapist perform UB dressing with asking pt to initiate movement to thread shirt. X-ray came to perform chest x ray and performed in sitting position on EOB with mod to max A to maintain position for xray to be taken.  Squat pivot transfer to the right with focus on initiation of movement and maintaining feet on the floor and maintaining forward weight shift. Max A in multiple squats with extra time. At sink focused on initiation of trunk and UEs to wash face with max A to support UEs against gravity with mod VC. Also performed hand washing with mod A with elbows propped up on the sink.  Left pt in tilt in space w/c with next therapist.   Therapy Documentation Precautions:  Precautions Precautions: Fall Precaution Comments: Trach, PEG Restrictions Weight Bearing Restrictions: No Pain:  sensitivity through the left LE with touch- allowed rest and decr touch  See Function Navigator for Current Functional  Status.   Therapy/Group: Individual Therapy  Willeen Cass [facility] 10/12/2016, 1:41 PM

## 2016-10-12 NOTE — Progress Notes (Signed)
Physical Therapy Session Note  Patient Details  Name: Judy Spears MRN: [id number] Date of Birth: 10-May-1950  Today's Date: 10/12/2016 PT Individual Time: 1300-1400 PT Individual Time Calculation (min): 60 min   Short Term Goals: Week 2:  PT Short Term Goal 1 (Week 2): =LTG due to estimated LOS  Skilled Therapeutic Interventions/Progress Updates:   Pt received sitting up in chair, agreeable to therapy. Husband present for session today. Total assist + 1 for squat pivot to mat table. Pt R side lying over pillow to stretch left trunk. Pt sit<>stands from mat table with max + 2 for to reach her full upright standing position x 8. Pt max assist + 2 for sit<>stand with steady for trunk control and push up from steady seat x 6. Pt able to hold stand 1 x briefly with removal of support. Pt transferred stand pivot to left side, positioned under pt left arm max assist + 1. Pt left sitting up in wheelchair, caregiver present.    Therapy Documentation Precautions:  Precautions Precautions: Fall Precaution Comments: Trach, PEG Restrictions Weight Bearing Restrictions: No  Vital Signs: O2 support decreased to room air but pt dropped to 80's. Increased to 2 L and pt stayed WNL during session.    See Function Navigator for Current Functional Status.   Therapy/Group: Individual Therapy  Gwinda Passe, SPT 10/12/2016, 3:47 PM

## 2016-10-13 ENCOUNTER — Inpatient Hospital Stay (HOSPITAL_COMMUNITY): Payer: Medicare Other | Admitting: Physical Therapy

## 2016-10-13 ENCOUNTER — Ambulatory Visit: Payer: Medicare Other | Admitting: Physical Medicine & Rehabilitation

## 2016-10-13 ENCOUNTER — Inpatient Hospital Stay (HOSPITAL_COMMUNITY): Payer: Medicare Other | Admitting: Occupational Therapy

## 2016-10-13 ENCOUNTER — Inpatient Hospital Stay (HOSPITAL_COMMUNITY): Payer: Medicare Other | Admitting: Speech Pathology

## 2016-10-13 LAB — BASIC METABOLIC PANEL
Anion gap: 6 (ref 5–15)
BUN: 12 mg/dL (ref 6–20)
CALCIUM: 9.2 mg/dL (ref 8.9–10.3)
CO2: 35 mmol/L — ABNORMAL HIGH (ref 22–32)
Chloride: 93 mmol/L — ABNORMAL LOW (ref 101–111)
Creatinine, Ser: 0.4 mg/dL — ABNORMAL LOW (ref 0.44–1.00)
Glucose, Bld: 116 mg/dL — ABNORMAL HIGH (ref 65–99)
Potassium: 4 mmol/L (ref 3.5–5.1)
Sodium: 134 mmol/L — ABNORMAL LOW (ref 135–145)

## 2016-10-13 LAB — GLUCOSE, CAPILLARY
GLUCOSE-CAPILLARY: 128 mg/dL — AB (ref 65–99)
GLUCOSE-CAPILLARY: 112 mg/dL — AB (ref 65–99)
Glucose-Capillary: 127 mg/dL — ABNORMAL HIGH (ref 65–99)

## 2016-10-13 MED ORDER — GABAPENTIN 100 MG PO CAPS
100.0000 mg | ORAL_CAPSULE | Freq: Two times a day (BID) | ORAL | Status: DC
  Administered 2016-10-13 – 2016-10-19 (×13): 100 mg via ORAL
  Filled 2016-10-13 (×13): qty 1

## 2016-10-13 MED ORDER — OSMOLITE 1.5 CAL PO LIQD
1000.0000 mL | ORAL | Status: DC
  Administered 2016-10-13 (×2): 1000 mL
  Filled 2016-10-13 (×4): qty 1000

## 2016-10-13 NOTE — Progress Notes (Signed)
[facility] PHYSICAL MEDICINE & REHABILITATION     PROGRESS NOTE    Subjective/Complaints: In bed. Eyes open. Appears comfortable.   ROS: Limited due to cognitive/behavioral   Objective: Vital Signs: Blood pressure 133/88, pulse 79, temperature 99 F (37.2 C), temperature source Axillary, resp. rate 18, height 5\' 2"  (1.575 m), weight 37.9 kg (83 lb 8.7 oz), last menstrual period 12/01/2010, SpO2 98 %. Dg Chest Port 1 View  Result Date: 10/12/2016 CLINICAL DATA:  Increased tracheal secretions EXAM: PORTABLE CHEST 1 VIEW COMPARISON:  09/29/2016 FINDINGS: Tracheostomy tube in satisfactory position. Right perihilar linear band of airspace disease likely reflecting scarring. No focal consolidation, pleural effusion or pneumothorax. Stable cardiomediastinal silhouette. No acute osseous abnormality. IMPRESSION: No active disease. Electronically Signed   By: Kathreen Devoid   On: 10/12/2016 10:50   No results for input(s): WBC, HGB, HCT, PLT in the last 72 hours.  Recent Labs  10/11/16 0509 10/13/16 0546  NA 130* 134*  K 3.5 4.0  CL 88* 93*  GLUCOSE 133* 116*  BUN 13 12  CREATININE 0.31* 0.40*  CALCIUM 9.1 9.2   CBG (last 3)   Recent Labs  10/12/16 1748 10/12/16 2352 10/13/16 0554  GLUCAP 141* 100* 128*    Wt Readings from Last 3 Encounters:  10/13/16 37.9 kg (83 lb 8.7 oz)  09/30/16 44.2 kg (97 lb 6.4 oz)  07/14/16 46.5 kg (102 lb 9.6 oz)    Physical Exam:  Constitutional:   Remains cachectic. No distress.  HENT:  Head: NCAT Mouth/Throat: Oropharynx is clear but dry  Eyes: Pupils are equal, round, and reactive to light. Conjunctivaeare normal.  Neck: minimal secretions from trach, tolerates. Passy Muir valve   . Cardiovascular: RRR without murmur. No JVD  .    Respiratory: CTA Bilaterally without wheezes or rales. Normal effort .   GI: Soft. Bowel sounds are normal. She exhibits no distension. There is no tenderness.   Musculoskeletal: She exhibits no edemaor  tenderness.  Neuro: fairly alertOngoing flexor tone in UE's with rhythmic movements /dyskinesias noted, pill rolling RUE persistent. LE's with hamstring tightness LLE, extensor tone RLE.  Is sensitive to ROM particularly on LLE, but RLE is sensitive too.    Skin: a few scattered abrasions Psych: flat  Assessment/Plan: 1. Functional and mobility deficits secondary to recent [place] encephalitis and subsequent pneumonia/bronchitis/respiratory sequelae which require 3+ hours per day of interdisciplinary therapy in a comprehensive inpatient rehab setting. Physiatrist is providing close team supervision and 24 hour management of active medical problems listed below. Physiatrist and rehab team continue to assess barriers to discharge/monitor patient progress toward functional and medical goals.  Function:  Bathing Bathing position Bathing activity did not occur: Safety/medical concerns (total A bed level night bath) Position: Bed  Bathing parts   Body parts bathed by helper: Right arm, Left arm, Chest, Abdomen, Front perineal area, Buttocks, Right upper leg, Left upper leg, Right lower leg, Left lower leg, Back  Bathing assist Assist Level: 2 helpers      Upper Body Dressing/Undressing Upper body dressing   What is the patient wearing?: Hospital gown   Bra - Perfomed by helper: Thread/unthread right bra strap, Thread/unthread left bra strap, Hook/unhook bra (pull down sports bra)   Pull over shirt/dress - Perfomed by helper: Thread/unthread right sleeve, Thread/unthread left sleeve, Put head through opening, Pull shirt over trunk        Upper body assist Assist Level: 2 helpers      Lower Body Dressing/Undressing Lower body  dressing   What is the patient wearing?: Hospital Gown       Pants- Performed by helper: Thread/unthread right pants leg, Pull pants up/down, Fasten/unfasten pants, Thread/unthread left pants leg       Socks - Performed by helper: Don/doff left sock,  Don/doff right sock   Shoes - Performed by helper: Don/doff right shoe, Don/doff left shoe, Fasten right, Fasten left          Lower body assist Assist for lower body dressing: 2 Automotive engineer activity did not occur: No continent bowel/bladder event   Toileting steps completed by helper: Adjust clothing prior to toileting, Performs perineal hygiene, Adjust clothing after toileting    Toileting assist Assist level: Two helpers   Transfers Chair/bed transfer   Chair/bed transfer method: Stand pivot Chair/bed transfer assist level: Maximal assist (Pt 25 - 49%/lift and lower) Chair/bed transfer assistive device: Mechanical lift Mechanical lift: Stedy   Locomotion Ambulation Ambulation activity did not occur: Safety/medical Editor, commissioning activity did not occur: N/A Type: Manual (dependent in tilt in space chair )      Cognition Comprehension Comprehension assist level: Understands basic 25 - 49% of the time/ requires cueing 50 - 75% of the time  Expression Expression assist level: Expresses basis less than 25% of the time/requires cueing >75% of the time.  Social Interaction Social Interaction assist level: Interacts appropriately 25 - 49% of time - Needs frequent redirection.  Problem Solving Problem solving assist level: Solves basic less than 25% of the time - needs direction nearly all the time or does not effectively solve problems and may need a restraint for safety  Memory Memory assist level: Recognizes or recalls 25 - 49% of the time/requires cueing 50 - 75% of the time   Medical Problem List and Plan: 1. 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  Neurology to follow up this week 2. DVT Prophylaxis/Anticoagulation: Pharmaceutical: Lovenox 3. Pain Management: legs are hypersensitive to touch which is something I noticed in the office with her last month too. Often inhibits activity    -will reintroduce low dose gabapentin and observe for response 4. Mood: LCSW to follow for evaluation and support.  5. Neuropsych: This patient is notcapable of making decisions on herown behalf. 6. Skin/Wound Care: Air mattress overlay. Routine pressure relief measures.  -appreciate [place] RN recs 7. Fluids/Electrolytes/Nutrition: Strict I/O.    -osmolite 1.5 goal rate of 60cc/hrx 20 hr  -prealbumin is 18 most recently. Continue to slowly adjust TF/supps to meet caloric needs. Still Losing weight however. Will speak to RD again today   -recheck prealbumin Friday 8. MRSA tracheobronchitis: pt continues on iv vanc--clinically improved.   abx were initiated 8/31.    -given hyponatremia, stopped bactrim on 9/13 9. Hyponatremia: recurrent but improving again  -at 134 9/19  -continue free water  100 mL 3 times a day sodium continues to improve,         10. Urinary retention: Monitor voiding with PVR checks --cath for volumes > 350cc 11. HTN: Monitor BP bid. Continue Norvasc  12. H/o constipation:   continue current regimen for now.  13. VDRF: Chest PT bid. ATC for humidification. Suction prn.  HOB elevated, aspiration precautions  14. Hypokalemia: Improved, potassium normal at 4.0 15. Dyskinesias/tremors: mysoline   - 25mg  at 0700, 1200 and 50mg  QHS   -therapy reports some improvement in voluntary muscle control although still display extrapyramidal signs.   -valium: stopped, prn klonopin 16. Breast cancer with hx of lumpectomy  -Appreciate Dr. Virgie Dad follow up. Arimidex to resume once discharged home  LOS (Days) 13 A [place] T, MD 10/13/2016 7:46 AM

## 2016-10-13 NOTE — Progress Notes (Signed)
RT in at 1322 and again at 1540 for CPT.  Pt unavailable both times.  RN aware.

## 2016-10-13 NOTE — Progress Notes (Signed)
Occupational Therapy Session Note  Patient Details  Name: Judy Spears MRN: [id number] Date of Birth: [birth year]  Today's Date: 10/13/2016 OT Individual Time: 1400-1500 OT Individual Time Calculation (min): 60 min    Short Term Goals: Week 2:  OT Short Term Goal 1 (Week 2): Pt will thread one UE into shirt with total A set-up and VCs with min A in order to increase participation with ADLs OT Short Term Goal 2 (Week 2): Pt will tolerate sitting on BSC with supervision once positioned as necessary to increase safety with toileting task OT Short Term Goal 3 (Week 2): Pt will complete stand pivot transfer with toal A +1 in prep for functional transfers  Skilled Therapeutic Interventions/Progress Updates:    Pt seen for OT session focusing on functional activity tolerance and positioning in prep for transfers. Pt sitting up in w/c upon arrival with husband and personal care attendant present, agreeable to tx session. In therapy gym, worked on sit <> stand from pt's tilt-in-space w/c with standard chair with arm rests in front, having pt work to pull self into standing. She was able to obtain modified stand with max- total A. Completed x3 trials.  Then completed squat pivot transfer to therapy mat with max A +1, pt able to assist with initiation.  Seated on EOM, worked on upright posture and midline orientation, requiring max multimodal cuing to facilitate R weight shift. Completed sit> stand from EOM with pt's R UE over therapist's shoulder. Max A to stand with multimodal cuing for "nose over toes" to facilitate anterior weight shift. Completed x4 trials with pt tolerating ~10 seconds each standing trial. Pt noted with increased tremors in B LEs. Placed #3 ankle weights on B LEs to facilitate proprioceptive input, little difference observed.  Pt left seated EOM at end of session with hand off to PT  Therapy Documentation Precautions:  Precautions Precautions: Fall Precaution Comments: Trach,  PEG Restrictions Weight Bearing Restrictions: No Pain:   No/ denies pain  See Function Navigator for Current Functional Status.   Therapy/Group: Individual Therapy  [name], Abdullahi Vallone C 10/13/2016, 7:13 AM

## 2016-10-13 NOTE — Progress Notes (Signed)
Physical Therapy Session Note  Patient Details  Name: Judy Spears MRN: [id number] Date of Birth: 04-15-1950  Today's Date: 10/13/2016 PT Individual Time: 1000-1100, 1500-1540 PT Individual Time Calculation (min): 60 min, 40 min   Short Term Goals: Week 2:  PT Short Term Goal 1 (Week 2): =LTG due to estimated LOS  Skilled Therapeutic Interventions/Progress Updates:   Session 1: Pt received sitting up in wheelchair, agreeable to therapy. Husband and caregiver present for session, participated by each performing total assist + 1 transfers during session. Demonstrated good technique.  Pt performed sit<>stand from mat table supporting under pt left arm with max assist + 2 to encourage upright posture x 6. Pt supine on incline with legs off table, supported by step, to stretch hip flexors. 10-20 sec x 2. Pt left sitting up in wheelchair in speech therapy room. Husband and caregiver present.   Session 2: Pt received sitting on mat table, handoff from OT. Pt agreeable to therapy. Husband and caregiver present, actively participated in session. Pt  sit<>stand supporting under pt left arm with max assist + 2 to encourage upright posture x 3. Pt  sit<>stand max assist + 2 to encourage upright posture with L platform standard walker x 6. Applied stretch to L trunk with pt in seated position ~30 sec x 3. Pt transferred back to wheelchair with stand pivot under pt left arm. Pt left sitting up in wheelchair, husband and caregiver present.   Therapy Documentation Precautions:  Precautions Precautions: Fall Precaution Comments: Trach, PEG Restrictions Weight Bearing Restrictions: No  Vital Signs: O2 on 4L throughout sessions.    See Function Navigator for Current Functional Status.   Therapy/Group: Individual Therapy  Gwinda Passe, SPT 10/13/2016, 12:36 PM

## 2016-10-13 NOTE — Patient Care Conference (Signed)
Inpatient RehabilitationTeam Conference and Plan of Care Update Date: 10/12/2016   Time: 2:10 PM    Patient Name: Judy Spears      Medical Record Number: [id number]  Date of Birth: 01-13-1951 Sex: Female         Room/Bed: 4W08C/4W08C-01 Payor Info: Payor: [employer] MEDICARE / Plan: [facility] MEDICARE / Product Type: *No Product type* /    Admitting Diagnosis: fever constipation  Admit Date/Time:  09/30/2016  5:29 PM Admission Comments: No comment available   Primary Diagnosis:  Spastic tetraplegia ([place]) Principal Problem: Spastic tetraplegia ([place])  Patient Active Problem List   Diagnosis Date Noted  . Severe muscle deconditioning 09/30/2016  . Pressure injury of skin 09/28/2016  . Respiratory failure ([place])   . Tracheobronchitis   . Urinary retention   . PEG (percutaneous endoscopic gastrostomy) status ([place])   . Benign essential HTN   . Hyponatremia   . Hypokalemia   . Reactive hypertension   . Respiratory insufficiency 09/24/2016  . Malignant neoplasm of upper-inner quadrant of left breast in female, estrogen receptor positive ([place]) 07/14/2016  . Heterotopic ossification of bone 03/10/2016  . Acute on chronic respiratory failure with hypoxia ([place])   . Acute hypoxemic respiratory failure ([place])   . Acute blood loss anemia   . Atelectasis 02/04/2016  . Debility 02/03/2016  . Pneumonia of both lower lobes due to Pseudomonas species ([place])   . Pneumonia of both lower lobes due to methicillin susceptible Staphylococcus aureus (MSSA) ([place])   . Acute respiratory failure with hypoxia ([place])   . Acute encephalopathy   . Seizures ([place])   . Sepsis ([place])   . Hypoxia   . Pain   . HCAP (healthcare-associated pneumonia) 12/28/2015  . Chronic respiratory failure ([place]) 12/28/2015  . Acute tracheobronchitis 12/24/2015  . Tracheostomy tube present ([place])   . Tracheal stenosis   . Chronic respiratory failure with hypoxia ([place])   . Spastic tetraplegia ([place]) 10/20/2015  . Tracheostomy status  ([place]) 09/26/2015  . Allergic rhinitis 09/26/2015  . 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 Movement disorder 09/22/2015  . Encephalitis 09/22/2015  . Chest pain 02/07/2014  . Premature atrial contractions 01/13/2014  . Abnormality of gait 12/04/2013  . Hyperlipidemia 12/21/2011  . Cardiovascular risk factor 12/21/2011  . Bunion 01/31/2008  . Metatarsalgia of both feet 09/20/2007  . FLAT FOOT 09/20/2007  . HALLUX RIGIDUS, ACQUIRED 09/20/2007    Expected Discharge Date: Expected Discharge Date: 10/22/16  Team Members Present: Physician leading conference: Dr. Alysia Penna Social Worker Present: Lennart Pall, LCSW Nurse Present: Dorien Chihuahua, RN PT Present: Kem Parkinson, PT OT Present: Willeen Cass, OT;Roanna Epley, COTA SLP Present: Weston Anna, SLP     Current Status/Progress Goal Weekly Team Focus  Medical   persistent dystonia and pulmonary considerations, trach, MRI with expected atrophy of brain  improve ROM and pulmonary capacity  ongoing trach mgt and respiratory mgt, trial of primidone for dystonia/tremors   Bowel/Bladder   incontinent of b/b, occasional voids, however, usually bladder scan q 4-6 hours and  I&O cath for volumes > 333ml. LBM 9/17 x2 after supp.  total assist with b/b  continue bladder scan q 4-6 hours, I&O cath for volumes >3108ml   Swallow/Nutrition/ Hydration   NPO with PEG  Max A for swallow iniitation with thermal stimulation  swallow initiation    ADL's   Max- total A +2  Max- total A +1  Bed mobility, functional transfers ( via STEDY, sliding board, and squat pivot), sitting balance,  family training   Mobility   Max assist +2 transfers with steady, min assist sitting balance but max for LOB recovery, max assist bed mobility   maxA +1 bed mobility and transfers with stedy; possible use of hoyer long term d/t pain and suspect slow progress  standing frame tolerance, stretching, sitting balance, transfers with steady    Communication   PMSV with full supervision, Total A  Max A  speech intelligibility at the word level    Safety/Cognition/ Behavioral Observations  Total A   Max A  arousal, attention    Pain   pain is not an issue         Skin   mouth remains very dry, Peg site CDI, cleanse bid with NS and drain sponge dressing. MASD to periarea, improved but remains reddened and swollen, buttocks with redness to small areas, no breakdown.  No worsening of skin while on IPR total A with skin care  continue with lotrimin bid, mouthcare q 2 hours, BID peg site care, skin care as needed q shift and prn.    Rehab Goals Patient on target to meet rehab goals: Yes *See Care Plan and progress notes for long and short-term goals.     Barriers to Discharge  Current Status/Progress Possible Resolutions Date Resolved   Physician    Medical stability        ongoing mgt of daily pulmonary and neurological sequelae of encephalitis.       Nursing                  PT                    OT                  SLP                SW                Discharge Planning/Teaching Needs:  To return home with famiy and private duty caregiver resuming 24/7 assistance.  Teaching ongoing   Team Discussion:  Movement, nutrition issues - has had further weight loss since admit.  No significant findings on recent MRI.  Encourage hands on with spouse and private caregiver.  Continue to work on [place] transfers which is easier on caregiver.  Some improved control of UE.    Revisions to Treatment Plan:  None    Continued Need for Acute Rehabilitation Level of Care: The patient requires daily medical management by a physician with specialized training in physical medicine and rehabilitation for the following conditions: Daily direction of a multidisciplinary physical rehabilitation program to ensure safe treatment while eliciting the highest outcome that is of practical value to the patient.: Yes Daily medical management  of patient stability for increased activity during participation in an intensive rehabilitation regime.: Yes Daily analysis of laboratory values and/or radiology reports with any subsequent need for medication adjustment of medical intervention for : Neurological problems;Pulmonary problems  Kazuma Elena 10/13/2016, 10:41 AM

## 2016-10-13 NOTE — Progress Notes (Signed)
Speech Language Pathology Daily Session Note  Patient Details  Name: Judy Spears MRN: [id number] Date of Birth: Apr 04, 1950  Today's Date: 10/13/2016 SLP Individual Time: 1100-1200 SLP Individual Time Calculation (min): 60 min  Short Term Goals: Week 2: SLP Short Term Goal 1 (Week 2): Pt will maintain arousal  in regards to keeping her eyes open for ~ 30 minutes with Min A multimodal cues.  SLP Short Term Goal 2 (Week 2): Given Max A multimodal cues, pt will increase speech intelligibility at the simple word level to ~25%. SLP Short Term Goal 3 (Week 2): Given Max A cues, pt with return demonstrate various blowing exercises to increase respiratory support for vocalizations.  SLP Short Term Goal 4 (Week 2): Given Max A multimodal cues and oral stimulation, pt will initiate swallow in 50% of trials.   Skilled Therapeutic Interventions: Skilled treatment session focused on increasing respiratory support to facilitate improved verbal communication. Despite Total cues (including visual, auditory, tactile and verbal) pt unable to demonstrate any effective blowing, imitate any inhalation/exhalation exercises/activities and no voicing heard or felt throughout session. Pt with severe difficulty orally placing mouth around whistle or straw. Pt's husband and caregiver present. Pt returned to room, left in husband's care. Continue per current plan of care.      Function:  Eating Eating   Modified Consistency Diet: Yes Eating Assist Level: Helper performs IV, parenteral or tube feed           Cognition Comprehension Comprehension assist level: Understands basic 25 - 49% of the time/ requires cueing 50 - 75% of the time  Expression Expression assistive device: Talk trach valve Expression assist level: Expresses basis less than 25% of the time/requires cueing >75% of the time.  Social Interaction Social Interaction assist level: Interacts appropriately less than 25% of the time. May be withdrawn or  combative.  Problem Solving Problem solving assist level: Solves basic less than 25% of the time - needs direction nearly all the time or does not effectively solve problems and may need a restraint for safety  Memory Memory assist level: Recognizes or recalls less than 25% of the time/requires cueing greater than 75% of the time;Recognizes or recalls 25 - 49% of the time/requires cueing 50 - 75% of the time    Pain    Therapy/Group: Individual Therapy  Belem Hintze 10/13/2016, 1:19 PM

## 2016-10-13 NOTE — Progress Notes (Signed)
Neurology Progress Note  Subjective :  The patient is seen today upon request from Dr. Tessa Lerner and the patient's husband. She is well known to me from previous office  And hospital visits . She was hospitalized for recurrent tracheobronchitis and is currently undergoing inpatient rehabilitation. Patient had an MRI scan of the brain performed on 10/09/16 which shows generalized atrophy and moderate chronic small vessel ischemic changes in patient's husband wanted to discuss this with me. Neurologically she'll remain the same as she is able to speak barely voice but can follow commands well and she remains quadriplegic. There have been no witnessed seizure activity or significant involvement in movements noted except constant picking of her fingers which does demonstrate sit long-standing and not a new finding Vitals:   10/13/16 0915 10/13/16 1322  BP:    Pulse: 84 82  Resp: 18 16  Temp:    SpO2: 98% 98%   Physical Exam Frail and malnourished cachectic looking middle-aged Caucasian lady. . Afebrile. Head is nontraumatic. Neck is supple without bruit.    Cardiac exam no murmur or gallop. Lungs are clear to auscultation. Distal pulses are well felt. Neurological Exam :  Mental Status: Awake and fully alert. Follows simple midline and one-step commands by nodding her head and signaling yes and no. Unable to speak sentences but can mouth a few occasional short words onlyIn a very soft voice and difficult to understand. H  . Cranial Nerves: Fundoscopic exam not done   Pupils equal, briskly reactive to light. Extraocular movements full without nystagmus but prominent saccadic dysmetria. Visual fields full to threatHearing probably okay Facial sensation intact. Face, tongue, palate moves normally and symmetrically.  Motor: Diminishedbulk and tone.throughout. Quadriparesis 2- 3/5but more weakness in the right upper extremity with weakness of right grip. Mild proximal and distal weakness throughout.  Bilateral non-fixed finger flexion contractures at the wrist. Bilateral foot drop  Sensory.:  Difficult to test but does withdraw to painful stimuli equally all 4 extremities. Coordination:Cannot be reliably tested Gait and Station:Patient is in a wheelchair and unable to walk with assistance  Reflexes: 1+ and symmetric. Toes downgoing.  Plantars are both downgoing  MRI Brain 10/09/16 : severely motion degraded but mild generalized atrophy and chronic small vessel ischemic changes. 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 MRI showing mild generalized atrophy and small vessel disease changes which are quite appropriate given her significant encephalitis and less and I did not see any new or worrisome findings.  Plan ; I had a long discussion with the patient's husband regarding the MRI findings and compared it with CT scan from December 2017 and September 2017 did not shows significant progression or any new or significant findings. He wants me to compare the current MRI with several previous ones  while in [place] and I will be happy to do so when the films become available. continue Ongoing medical treatment as per Rehab team. No further neurological testing and medication changes indicated at the present time. Patient may be seen for follow-up in the office in the future as needed.  Greater than 50% time during this 35 minute visit was spent on counseling and coordination of care discussion with the patient's husband about brain  MRI findings and answering questions  Antony Contras, MD

## 2016-10-13 NOTE — Progress Notes (Signed)
Social Work Patient ID: Judy Spears, female   DOB: November 16, 1950, 66 y.o.   MRN: [id number]   Met with pt's spouse this morning to review team conference. He is pleased with target d/c date of 9/28.  His primary concern is her continued weight loss and hopeful her tube feedings can be increased to her home rate of 85 cc.  MD aware.  He has arranged increased private duty support to begin at d/c.  Will continue to follow for d/c planning and support needs.    Jessie Schrieber, LCSW

## 2016-10-14 ENCOUNTER — Encounter (HOSPITAL_COMMUNITY): Payer: Self-pay

## 2016-10-14 ENCOUNTER — Ambulatory Visit: Payer: Medicare Other | Admitting: Physical Therapy

## 2016-10-14 ENCOUNTER — Inpatient Hospital Stay (HOSPITAL_COMMUNITY): Payer: Medicare Other | Admitting: Physical Therapy

## 2016-10-14 ENCOUNTER — Inpatient Hospital Stay (HOSPITAL_COMMUNITY): Payer: Medicare Other | Admitting: Occupational Therapy

## 2016-10-14 ENCOUNTER — Inpatient Hospital Stay (HOSPITAL_COMMUNITY): Payer: Medicare Other | Admitting: Speech Pathology

## 2016-10-14 LAB — GLUCOSE, CAPILLARY
GLUCOSE-CAPILLARY: 123 mg/dL — AB (ref 65–99)
GLUCOSE-CAPILLARY: 138 mg/dL — AB (ref 65–99)
GLUCOSE-CAPILLARY: 63 mg/dL — AB (ref 65–99)
GLUCOSE-CAPILLARY: 88 mg/dL (ref 65–99)
Glucose-Capillary: 116 mg/dL — ABNORMAL HIGH (ref 65–99)
Glucose-Capillary: 116 mg/dL — ABNORMAL HIGH (ref 65–99)

## 2016-10-14 MED ORDER — PRIMIDONE 50 MG PO TABS
50.0000 mg | ORAL_TABLET | Freq: Two times a day (BID) | ORAL | Status: DC
  Administered 2016-10-14 – 2016-10-21 (×14): 50 mg
  Filled 2016-10-14 (×15): qty 1

## 2016-10-14 MED ORDER — OSMOLITE 1.5 CAL PO LIQD
1000.0000 mL | ORAL | Status: DC
  Administered 2016-10-14: 1000 mL
  Filled 2016-10-14 (×5): qty 1000

## 2016-10-14 NOTE — Progress Notes (Signed)
Pt was in PT for her 1200 trach check. RT did vitals only

## 2016-10-14 NOTE — Progress Notes (Signed)
Physical Therapy Session Note  Patient Details  Name: Judy Spears MRN: [id number] Date of Birth: Nov 15, 1950  Today's Date: 10/14/2016 PT Individual Time: 0459-9774 PT Individual Time Calculation (min): 45 min   Short Term Goals: Week 2:  PT Short Term Goal 1 (Week 2): =LTG due to estimated LOS  Skilled Therapeutic Interventions/Progress Updates:   Pt received sitting up in wheelchair, agreeable to therapy. Caregiver present and actively participated in session. Pt total assist for chair/mat squat pivot transfer. Pt total assist sit>sidelying. Slidelying to stretch L trunk. Pt total assist +2 to roll sidelying>supine to manage LE tone. Pt performed 1 set 10 reps of glut bridges. Pt unable to lift off mat but muscle activation noted. Pt total assist +2 supine to sit. Transferred back to chair with total assist + 2 for time management. Pt left sitting up in wheelchair, all needs in reach, caregiver present.    Therapy Documentation Precautions:  Precautions Precautions: Fall Precaution Comments: Trach, PEG Restrictions Weight Bearing Restrictions: No Vital Signs: Pt on 3L O2 during session, O2 stayed WNL  See Function Navigator for Current Functional Status.   Therapy/Group: Individual Therapy  Gwinda Passe, SPT 10/14/2016, 4:01 PM

## 2016-10-14 NOTE — Progress Notes (Signed)
Occupational Therapy Session Note  Patient Details  Name: Judy Spears MRN: [id number] Date of Birth: 15-Jan-1951  Today's Date: 10/14/2016 OT Individual Time: 1000-1100 OT Individual Time Calculation (min): 60 min    Short Term Goals: Week 2:  OT Short Term Goal 1 (Week 2): Pt will thread one UE into shirt with total A set-up and VCs with min A in order to increase participation with ADLs OT Short Term Goal 2 (Week 2): Pt will tolerate sitting on BSC with supervision once positioned as necessary to increase safety with toileting task OT Short Term Goal 3 (Week 2): Pt will complete stand pivot transfer with toal A +1 in prep for functional transfers  Skilled Therapeutic Interventions/Progress Updates:    Pt seen for OT session focusing on functional transfers, sitting balance and discussion/ plans for d/c. Pt sitting up in tilt-in-space w/c upon arrival with personal care attendant present and pt's husband arriving at begging of session. Discussed toileting plans for d/c as pt began to spontanetously void last night. Pt's caregiver opting to complete squat pivot transfer to drop arm BSC then use STEDY for standing to do clothing management and hygiene. Discussed various alternative methods as family only able to use STEDY downstairs and will need method of toileting tasks when upstairs and STEDY not available. Pt required total A squat pivot transfer, this was too much physically for caregiver as pt not assisting at all during transfer. Recommended using sliding board for such transfers. Pt required +2 assist for standing and clothing management to be completed total A. Pt positioned with pillows for midline orientation due to L lean. While sitting on BSC and pt attempting to void, pt's caregiver washed her hair. Pt with small continent BM while on BSC. Used STEDY to stand and complete clothing management and return to w/c. Gentle PROM performed to R UE with noted decrease in tone and tremors during  ROM. Pt left sitting up in w/c at end of session with caregivers present.  Recommending drop arm BSC for home use, pt's caregivers and CSW made aware.   Therapy Documentation Precautions:  Precautions Precautions: Fall Precaution Comments: Trach, PEG Restrictions Weight Bearing Restrictions: No Pain:   No/ denies pain  See Function Navigator for Current Functional Status.   Therapy/Group: Individual Therapy  [name], Yitzel Shasteen C 10/14/2016, 6:59 AM

## 2016-10-14 NOTE — Progress Notes (Signed)
Speech Language Pathology Daily Session Note  Patient Details  Name: Judy Spears MRN: [id number] Date of Birth: 1951-01-08  Today's Date: 10/14/2016   Skilled treatment session #1 SLP Individual Time: 0915-1000 SLP Individual Time Calculation (min): 45 min   Skilled treatment session #2 SLP Individual Time: 1100-1145  SLP Individual Time Calculation (min):45 min  Short Term Goals: Week 2: SLP Short Term Goal 1 (Week 2): Pt will maintain arousal  in regards to keeping her eyes open for ~ 30 minutes with Min A multimodal cues.  SLP Short Term Goal 2 (Week 2): Given Max A multimodal cues, pt will increase speech intelligibility at the simple word level to ~25%. SLP Short Term Goal 3 (Week 2): Given Max A cues, pt with return demonstrate various blowing exercises to increase respiratory support for vocalizations.  SLP Short Term Goal 4 (Week 2): Given Max A multimodal cues and oral stimulation, pt will initiate swallow in 50% of trials.   Skilled Therapeutic Interventions:     Skilled treatment session #1 focused on prefunctional communication skills that including naming colors (articulatory movements only, no voicing), attempts at selecting requested color (with Max physical support for involuntary movements), yes/no questions with written choices for places where children lived. Max Total cues employed - mirror, tactile, verbal and pt reposition but pt unable to shift gaze to areas of activities. When pt able to shift gaze it was for ~ 1-2 seconds. Pt with no vocalizations throughout tasks.  Skilled treatment session #2 focused on increasing respiratory support for vocalizations. Pt required Total A to shift gaze with use of mirror and visual feedback for blowing with kleenex. Pt unable to maintain lip seal around straw to direct air. SLP further facilitated session by face timing with pt's daughter. Pt required Total A multimodal cues to shift gaze to daughter on phone and no vocalizations  heard or feel by SLP. Education provided on decreased vocalizations over last week and increased gaze to pt's left over last week. Will continue targeting above mentioned goals.   Function:  Eating Eating                 Cognition Comprehension Comprehension assist level: Understands basic less than 25% of the time/ requires cueing >75% of the time;Understands basic 25 - 49% of the time/ requires cueing 50 - 75% of the time  Expression Expression assistive device: Talk trach valve Expression assist level: Expresses basis less than 25% of the time/requires cueing >75% of the time.  Social Interaction Social Interaction assist level: Interacts appropriately less than 25% of the time. May be withdrawn or combative.  Problem Solving Problem solving assist level: Solves basic less than 25% of the time - needs direction nearly all the time or does not effectively solve problems and may need a restraint for safety  Memory Memory assist level: Recognizes or recalls less than 25% of the time/requires cueing greater than 75% of the time    Pain Pain Assessment Pain Assessment: Faces Faces Pain Scale: No hurt  Therapy/Group: Individual Therapy  [name] 10/14/2016, 11:45 AM

## 2016-10-14 NOTE — Progress Notes (Signed)
Nutrition Follow-up  DOCUMENTATION CODES:   Severe malnutrition in context of chronic illness, Underweight  INTERVENTION:  Continue Osmolite 1.5 formula via PEG to new goal rate of 75 ml/hr x 20 hours (may hold TF for up to 4 hours for therapy) with 30 ml Prostat once daily to provide 2350 kcal, 109 grams of protein, and 1140 ml of water.  Continuefree water flushes of 100 ml QID via tube. Total free water: 1540 ml/day.  Upon discharge, recommend Home tube feeding regimen of Osmolite 1.5 formula via PEG at goal rate of 85 ml/hr x 20 hours (may hold TF for up to 4 hours) to provide 2550 kcal, 107 grams of protein, and 1292 ml of water. Continuefree water flushes of 100 ml QID via tube.  NUTRITION DIAGNOSIS:   Malnutrition (severe) related to chronic illness (powassen encephalitis) as evidenced by severe depletion of body fat, severe depletion of muscle mass; ongoing  GOAL:   Patient will meet greater than or equal to 90% of their needs;met  MONITOR:   TF tolerance, Skin, Weight trends, Labs, I & O's  REASON FOR ASSESSMENT:   Consult Enteral/tube feeding initiation and management  ASSESSMENT:   66 y.o. 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 She was admitted on 09/24/16 with lethargy. SOB and low grade fevers due to MRSA tracheobronchitis v/s PNA. PEG tube evaluated by IVR and was exchanged out on 9/5. Patient has had significant decline in mobility and ability to carryout ADL tasks. CIR recommended by rehab team.  Pt with gradual weight loss, thus tube feeds were increase to new goal rate of 75 ml/hr. To aid in weight gain and prevention of further weight loss, recommend home tube feeding regimen upon discharge of Osmolite 1.5 formula via PEG at goal rate of 85 ml/hr x 20 hours (may hold TF for up to 4  hours) to provide 2550 kcal, 107 grams of protein, and 1292 ml of water.   Pt was unavailable during time of visit, pt busy with nursing care. Family not at bedside. Dr. Swartz, MD, had contacted RD regarding need for increased tube feeds as pt with weight loss and recommend RD educate family on new tube feeding orders. RD to educate at next visit. Additionally spoke with Pam, PA, regarding pt care and PA recommends pt to discharge home on continuous feeds to aid in promotion of tolerance.   Labs and medications reviewed.   Diet Order:  Diet NPO time specified  Skin:  Reviewed, no issues  Last BM:  9/19  Height:   Ht Readings from Last 1 Encounters:  09/30/16 5' 2" (1.575 m)    Weight:   Wt Readings from Last 1 Encounters:  10/14/16 74 lb 15.3 oz (34 kg)    Ideal Body Weight:  50 kg  BMI:  Body mass index is 13.71 kg/m.  Estimated Nutritional Needs:   Kcal:  1700-1900  Protein:  65-75 gram  Fluid:  1.7-1.9 L/day  EDUCATION NEEDS:   No education needs identified at this time  Stephanie Craig, MS, RD, LDN Pager # [phone] After hours/ weekend pager # [phone]  

## 2016-10-14 NOTE — Progress Notes (Signed)
[facility] PHYSICAL MEDICINE & REHABILITATION     PROGRESS NOTE    Subjective/Complaints: Had a fair night. Tolerated tf which are 75cc/hr  ROS: Limited due to cognitive/behavioral   Objective: Vital Signs: Blood pressure (!) 144/85, pulse 86, temperature 98.3 F (36.8 C), temperature source Oral, resp. rate 17, height 5\' 2"  (1.575 m), weight 34 kg (74 lb 15.3 oz), last menstrual period 12/01/2010, SpO2 99 %. Dg Chest Port 1 View  Result Date: 10/12/2016 CLINICAL DATA:  Increased tracheal secretions EXAM: PORTABLE CHEST 1 VIEW COMPARISON:  09/29/2016 FINDINGS: Tracheostomy tube in satisfactory position. Right perihilar linear band of airspace disease likely reflecting scarring. No focal consolidation, pleural effusion or pneumothorax. Stable cardiomediastinal silhouette. No acute osseous abnormality. IMPRESSION: No active disease. Electronically Signed   By: Kathreen Devoid   On: 10/12/2016 10:50   No results for input(s): WBC, HGB, HCT, PLT in the last 72 hours.  Recent Labs  10/13/16 0546  NA 134*  K 4.0  CL 93*  GLUCOSE 116*  BUN 12  CREATININE 0.40*  CALCIUM 9.2   CBG (last 3)   Recent Labs  10/14/16 0010 10/14/16 0522 10/14/16 0624  GLUCAP 116* 63* 123*    Wt Readings from Last 3 Encounters:  10/14/16 34 kg (74 lb 15.3 oz)  09/30/16 44.2 kg (97 lb 6.4 oz)  07/14/16 46.5 kg (102 lb 9.6 oz)    Physical Exam:  Constitutional:   Remains cachectic. No distress.  HENT:  Head: NCAT Mouth/Throat: Oropharynx is clear but dry  Eyes: Pupils are equal, round, and reactive to light. Conjunctivaeare normal.  Neck: minimal secretions from trach, tolerates. Passy Muir valve   . Cardiovascular: RRR without murmur. No JVD .    Respiratory: CTA Bilaterally without wheezes or rales. Normal effort    GI: Soft. Bowel sounds are normal. She exhibits no distension. There is no tenderness.   Musculoskeletal: She exhibits no edemaor tenderness.  Neuro: very alert. tOngoing  flexor tone in UE's with rhythmic movements /dyskinesias noted. Clonus like movements of left patella. LE's with hamstring tightness LLE--was able to get her close to -30 to -25 degrees, extensor tone RLE.  Is sensitive to ROM particularly on LLE, but RLE is sensitive too.    Skin: a few scattered abrasions Psych: flat  Assessment/Plan: 1. Functional and mobility deficits secondary to recent Powassan encephalitis and subsequent pneumonia/bronchitis/respiratory sequelae which require 3+ hours per day of interdisciplinary therapy in a comprehensive inpatient rehab setting. Physiatrist is providing close team supervision and 24 hour management of active medical problems listed below. Physiatrist and rehab team continue to assess barriers to discharge/monitor patient progress toward functional and medical goals.  Function:  Bathing Bathing position Bathing activity did not occur: Safety/medical concerns (total A bed level night bath) Position: Bed  Bathing parts   Body parts bathed by helper: Right arm, Left arm, Chest, Abdomen, Front perineal area, Buttocks, Right upper leg, Left upper leg, Right lower leg, Left lower leg, Back  Bathing assist Assist Level: 2 helpers      Upper Body Dressing/Undressing Upper body dressing   What is the patient wearing?: Hospital gown   Bra - Perfomed by helper: Thread/unthread right bra strap, Thread/unthread left bra strap, Hook/unhook bra (pull down sports bra)   Pull over shirt/dress - Perfomed by helper: Thread/unthread right sleeve, Thread/unthread left sleeve, Put head through opening, Pull shirt over trunk        Upper body assist Assist Level: 2 helpers  Lower Body Dressing/Undressing Lower body dressing   What is the patient wearing?: Hospital Gown       Pants- Performed by helper: Thread/unthread right pants leg, Pull pants up/down, Fasten/unfasten pants, Thread/unthread left pants leg       Socks - Performed by helper: Don/doff  left sock, Don/doff right sock   Shoes - Performed by helper: Don/doff right shoe, Don/doff left shoe, Fasten right, Fasten left          Lower body assist Assist for lower body dressing: 2 Automotive engineer activity did not occur: No continent bowel/bladder event   Toileting steps completed by helper: Adjust clothing prior to toileting, Performs perineal hygiene, Adjust clothing after toileting    Toileting assist Assist level: Two helpers   Transfers Chair/bed transfer   Chair/bed transfer method: Squat pivot Chair/bed transfer assist level: Total assist (Pt < 25%) Chair/bed transfer assistive device: Mechanical lift Mechanical lift: Stedy   Locomotion Ambulation Ambulation activity did not occur: Safety/medical Editor, commissioning activity did not occur: N/A Type: Manual (dependent in tilt in space chair )      Cognition Comprehension Comprehension assist level: Understands basic 25 - 49% of the time/ requires cueing 50 - 75% of the time  Expression Expression assist level: Expresses basis less than 25% of the time/requires cueing >75% of the time.  Social Interaction Social Interaction assist level: Interacts appropriately less than 25% of the time. May be withdrawn or combative.  Problem Solving Problem solving assist level: Solves basic less than 25% of the time - needs direction nearly all the time or does not effectively solve problems and may need a restraint for safety  Memory Memory assist level: Recognizes or recalls less than 25% of the time/requires cueing greater than 75% of the time, Recognizes or recalls 25 - 49% of the time/requires cueing 50 - 75% of the time   Medical Problem List and Plan: 1. 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 Leonie Man.  Appreciate his follow up and discussion with husband.  2. DVT Prophylaxis/Anticoagulation: Pharmaceutical: Lovenox 3. Pain Management: legs are hypersensitive to touch which is something I noticed in the office with her last month too. Often inhibits activity    -will reintroduce low dose gabapentin and observe for response 4. Mood: LCSW to follow for evaluation and support.  5. Neuropsych: This patient is notcapable of making decisions on herown behalf. 6. Skin/Wound Care: Air mattress overlay. Routine pressure relief measures.  -appreciate Orestes RN recs 7. Fluids/Electrolytes/Nutrition: Strict I/O.    -osmolite 1.5 at 75cc/hrx 20 hr currently---will push towards home rate of 85cc/hr  -prealbumin is 18 most recently.  . Still Losing weight but inaccurate today!    -recheck prealbumin tomorrow  -RD follow up 8. MRSA tracheobronchitis: pt continues on iv vanc--clinically improved.   abx were initiated 8/31.    -given hyponatremia, stopped bactrim on 9/13 9. Hyponatremia: recurrent but improving again  -at 134 9/19  -continue free water  100 mL 3 times a day sodium continues to improve,         10. Urinary retention: Monitor voiding with PVR checks --cath for volumes > 350cc 11. HTN: Monitor BP bid. Continue Norvasc  12. H/o constipation:   continue current regimen for now.  13. VDRF: Chest  PT bid. ATC for humidification. Suction prn. HOB elevated, aspiration precautions  14. Hypokalemia: Improved, potassium normal at 4.0 15. Dyskinesias/tremors: mysoline   - 25mg  at 0700, 1200 and 50mg  QHS--adjust day time doses to 50mg  as well   -therapy reports some improvement in voluntary muscle control although still display extrapyramidal signs.   -valium: stopped, prn klonopin 16. Breast cancer with hx of lumpectomy  -Appreciate Dr. Virgie Dad follow up. Arimidex to resume once discharged home  LOS (Days) [place] T, MD 10/14/2016 9:00 AM

## 2016-10-14 NOTE — Plan of Care (Signed)
Problem: RH PAIN MANAGEMENT Goal: RH STG PAIN MANAGED AT OR BELOW PT'S PAIN GOAL Face scale of less than or equal to 2.  Outcome: Progressing No signs of pain noted

## 2016-10-15 ENCOUNTER — Inpatient Hospital Stay (HOSPITAL_COMMUNITY): Payer: Medicare Other | Admitting: Occupational Therapy

## 2016-10-15 ENCOUNTER — Inpatient Hospital Stay (HOSPITAL_COMMUNITY): Payer: Medicare Other | Admitting: Physical Therapy

## 2016-10-15 ENCOUNTER — Inpatient Hospital Stay (HOSPITAL_COMMUNITY): Payer: Medicare Other | Admitting: Speech Pathology

## 2016-10-15 LAB — URINALYSIS, ROUTINE W REFLEX MICROSCOPIC
Bilirubin Urine: NEGATIVE
GLUCOSE, UA: NEGATIVE mg/dL
HGB URINE DIPSTICK: NEGATIVE
Ketones, ur: NEGATIVE mg/dL
NITRITE: NEGATIVE
PH: 6 (ref 5.0–8.0)
SPECIFIC GRAVITY, URINE: 1.017 (ref 1.005–1.030)
Squamous Epithelial / LPF: NONE SEEN

## 2016-10-15 LAB — GLUCOSE, CAPILLARY
GLUCOSE-CAPILLARY: 130 mg/dL — AB (ref 65–99)
Glucose-Capillary: 107 mg/dL — ABNORMAL HIGH (ref 65–99)
Glucose-Capillary: 148 mg/dL — ABNORMAL HIGH (ref 65–99)

## 2016-10-15 LAB — BASIC METABOLIC PANEL
ANION GAP: 10 (ref 5–15)
BUN: 13 mg/dL (ref 6–20)
CALCIUM: 9.2 mg/dL (ref 8.9–10.3)
CHLORIDE: 93 mmol/L — AB (ref 101–111)
CO2: 32 mmol/L (ref 22–32)
Creatinine, Ser: 0.37 mg/dL — ABNORMAL LOW (ref 0.44–1.00)
GFR calc non Af Amer: >60 mL/min (ref >60)
Glucose, Bld: 135 mg/dL — ABNORMAL HIGH (ref 65–99)
Potassium: 3.6 mmol/L (ref 3.5–5.1)
Sodium: 135 mmol/L (ref 135–145)

## 2016-10-15 LAB — PREALBUMIN: PREALBUMIN: 23.1 mg/dL (ref 18–38)

## 2016-10-15 LAB — MAGNESIUM: Magnesium: 2 mg/dL (ref 1.7–2.4)

## 2016-10-15 MED ORDER — SULFAMETHOXAZOLE-TRIMETHOPRIM 800-160 MG PO TABS
1.0000 | ORAL_TABLET | Freq: Two times a day (BID) | ORAL | Status: DC
  Administered 2016-10-15 – 2016-10-16 (×2): 1
  Filled 2016-10-15 (×2): qty 1

## 2016-10-15 MED ORDER — OSMOLITE 1.5 CAL PO LIQD
1000.0000 mL | ORAL | Status: DC
  Administered 2016-10-15: 1000 mL
  Filled 2016-10-15 (×5): qty 1000

## 2016-10-15 NOTE — Progress Notes (Signed)
Physical Therapy Weekly Progress Note  Patient Details  Name: Judy Spears MRN: [id number] Date of Birth: [birth year]  Beginning of progress report period: October 08, 2016 End of progress report period: October 15, 2016  Today's Date: 10/15/2016 PT Individual Time: 0940-7680 PT Individual Time Calculation (min): 45 min   Patient is slowly progressing toward long goals. Requires total assist + 1 for squat pivot transfers. Total assist +2 for bed mobility for managing tone in LE. Pt sit<>stand max assist and is slowly progressing in upright position. Limited by LLE tone.   Patient continues to demonstrate the following deficits muscle weakness and muscle joint tightness, decreased cardiorespiratoy endurance and decreased oxygen support, impaired timing and sequencing, abnormal tone, unbalanced muscle activation, motor apraxia, decreased coordination and decreased motor planning, decreased initiation, decreased attention, decreased awareness, decreased problem solving, decreased safety awareness and delayed processing and decreased sitting balance, decreased standing balance, decreased postural control and decreased balance strategies and therefore will continue to benefit from skilled PT intervention to increase functional independence with mobility.  Patient is slowly progressing toward long term goals..  Continue plan of care.  PT Short Term Goals Week 3:  PT Short Term Goal 1 (Week 3): =LTG due to length of stay   Skilled Therapeutic Interventions/Progress Updates:    Pt received sitting up in wheelchair, agreeable to therapy. Caregiver present for session. Pt squat pivot mat/chair transfer with total assist +1. Pt maintain sitting balance briefly with RUE supported and min assist. Max assist for recover after LOB to L side. Pt sit<>stand 4x with mod assist +2. Pt able to assist more today than previous session and present with slightly improved upright posture. Pt sit<>supine with total  assist + 2 for LE tone management. Pt R sidelying over pillow to stretch L trunk. PROM into extension attempted for L hip, limited by tone. Pt left sitting up in wheelchair, caregiver present.   Therapy Documentation Precautions:  Precautions Precautions: Fall Precaution Comments: Trach, PEG Restrictions Weight Bearing Restrictions: No  Vital Signs: O2 3L throughout session, pt SpO2 > 90   See Function Navigator for Current Functional Status.  Therapy/Group: Individual Therapy  Gwinda Passe, SPT 10/15/2016, 3:22 PM

## 2016-10-15 NOTE — Progress Notes (Signed)
Occupational Therapy Weekly Progress Note  Patient Details  Name: Judy Spears MRN: [id number] Date of Birth: 03-26-1950  Beginning of progress report period: October 08, 2016 End of progress report period: October 15, 2016  Today's Date: 10/15/2016 OT Individual Time: 1100-1200 OT Individual Time Calculation (min): 60 min    Patient has met 2 of 3 short term goals.  Pt making limited progress in regards to functional gains with ADLs and mobility. Pt currently requiring max- total A for all mobility and ADLs. She can require max A +1 when performing well with decreased tone and pt with attention to task, however, she can require total A +2 for all mobility, transfers, and ADLs.  Pt's caregivers have been present and participating in all tx sessions and are actively discussing d/c planning and providing proper care for pt at home both on "good days" and "bad days". They are aware of amount of assist pt requires and pt's husband is in position to hire more caregivers if needed.   Patient continues to demonstrate the following deficits: abnormal posture, ataxia, cognitive deficits, disturbance of vision and muscle weakness (generalized) and therefore will continue to benefit from skilled OT intervention to enhance overall performance with BADL and Reduce care partner burden.  Patient progressing toward long term goals..  Continue plan of care.  OT Short Term Goals OT Short Term Goal 1 (Week 2): Pt will thread one UE into shirt with total A set-up and VCs with min A in order to increase participation with ADLs OT Short Term Goal 1 - Progress (Week 2): Met OT Short Term Goal 2 (Week 2): Pt will tolerate sitting on BSC with supervision once positioned as necessary to increase safety with toileting task OT Short Term Goal 2 - Progress (Week 2): Met OT Short Term Goal 3 (Week 2): Pt will complete stand pivot transfer with toal A +1 in prep for functional transfers OT Short Term Goal 3 - Progress  (Week 2): Progressing toward goal  Skilled Therapeutic Interventions/Progress Updates:    Pt seen for OT session focusing on sit <> stand in prep for functional tasks and functional transfers with caregivers. Pt received in tilt-in-space w/c upon arrival with pt's husband and personal care giver present.  In therapy gym, completed x3 sit<> stand with standard chair placed in front of pt. Pt stood with max A, able to clear bottom ~9 inches though unable to obtain fully erect posture. Pt tolerated standing ~15 seconds each trial. She was able to initiate sit with VCs.  Completed x3 wc <> EOM transfers with therapist first providing assist then caregiver return demonstrating technique. Transfers completed at overall max A, however, pt able to assist much more today compared to yesterday's session. Education and demonstration provided for Bobath transfer technique. Caregiver return demonstrated and voiced liking that method more. Education provided throughout regarding pt and caregiver's body mechanics in order to increase pt's participation and reduce caregiver burden.  Completed stands in [place], she was able to be positioned with pillows to sit with close supervision in STEDY, however, when fatigued had strong L lean. Attempted to adjust pelvic to facilitate midline, however, not successful. Pt returned to w/c and left in her room with caregivers present.   Therapy Documentation Precautions:  Precautions Precautions: Fall Precaution Comments: Trach, PEG Restrictions Weight Bearing Restrictions: No Pain: Pain Assessment Faces Pain Scale: No hurt  See Function Navigator for Current Functional Status.   Therapy/Group: Individual Therapy  [name], Amrom Ore C 10/15/2016, 7:06 AM

## 2016-10-15 NOTE — Progress Notes (Signed)
Physical Therapy Session Note  Patient Details  Name: Judy Spears MRN: [id number] Date of Birth: Nov 18, 1950  Today's Date: 10/15/2016 PT Individual Time: 8138-8719 PT Individual Time Calculation (min): 23 min   Short Term Goals: Week 2:  PT Short Term Goal 1 (Week 2): =LTG due to estimated LOS  Skilled Therapeutic Interventions/Progress Updates:    no indication of pain based on faces scale, though does grimace with LEs in dependent position and when LEs extended.  Session focus on tone management.   Pt requires total assist to transfer w/c>bed on R and to come to supine.  PROM stretching to tolerance for LLE in supine for tone management.  Pt completes hip/knee flexion/extension against resistance, each LE to fatigue.  Left supine for RN to complete cath.  Caregiver at bedside.   Therapy Documentation Precautions:  Precautions Precautions: Fall Precaution Comments: Trach, PEG Restrictions Weight Bearing Restrictions: No   See Function Navigator for Current Functional Status.   Therapy/Group: Individual Therapy  Michel Santee 10/15/2016, 4:14 PM

## 2016-10-15 NOTE — Progress Notes (Signed)
[facility] PHYSICAL MEDICINE & REHABILITATION     PROGRESS NOTE    Subjective/Complaints: No problems over night. Family/staff noted she wasn't concentrating as much with therapies yesterday. Didn't transfer as well  ROS: pt denies nausea, vomiting, diarrhea, cough, shortness of breath or chest pain   Objective: Vital Signs: Blood pressure (!) 173/83, pulse 84, temperature 97.9 F (36.6 C), temperature source Axillary, resp. rate 16, height 5\' 2"  (1.575 m), weight 37.6 kg (83 lb), last menstrual period 12/01/2010, SpO2 100 %. No results found. No results for input(s): WBC, HGB, HCT, PLT in the last 72 hours.  Recent Labs  10/13/16 0546 10/15/16 0414  NA 134* 135  K 4.0 3.6  CL 93* 93*  GLUCOSE 116* 135*  BUN 12 13  CREATININE 0.40* 0.37*  CALCIUM 9.2 9.2   CBG (last 3)   Recent Labs  10/14/16 1730 10/14/16 2349 10/15/16 0556  GLUCAP 138* 88 130*    Wt Readings from Last 3 Encounters:  10/15/16 37.6 kg (83 lb)  09/30/16 44.2 kg (97 lb 6.4 oz)  07/14/16 46.5 kg (102 lb 9.6 oz)    Physical Exam:  Constitutional:   Remains cachectic. No distress.  HENT:  Head: NCAT Mouth/Throat: Oropharynx is clear but dry  Eyes: Pupils are equal, round, and reactive to light. Conjunctivaeare normal.  Neck: minimal secretions from trach, tolerates. Passy Muir valve   . Cardiovascular: RRR without murmur. No JVD  .    Respiratory:CTA Bilaterally without wheezes or rales. Normal effort     GI: Soft. Bowel sounds are normal. She exhibits no distension. There is no tenderness.   Musculoskeletal: She exhibits no edemaor tenderness.  Neuro:   alert. Ongoing flexor tone in UE's with rhythmic movements /dyskinesias noted. Clonus like movements of left patella persist today. LE's with hamstring tightness LLE--was able to get her nearly to -30 to -25 degrees, extensor tone RLE.  Is sensitive to ROM particularly on LLE, but RLE is sensitive too.    Skin: a few scattered  abrasions Psych: flat  Assessment/Plan: 1. Functional and mobility deficits secondary to recent [place] encephalitis and subsequent pneumonia/bronchitis/respiratory sequelae which require 3+ hours per day of interdisciplinary therapy in a comprehensive inpatient rehab setting. Physiatrist is providing close team supervision and 24 hour management of active medical problems listed below. Physiatrist and rehab team continue to assess barriers to discharge/monitor patient progress toward functional and medical goals.  Function:  Bathing Bathing position Bathing activity did not occur: Safety/medical concerns (total A bed level night bath) Position: Bed  Bathing parts   Body parts bathed by helper: Right arm, Left arm, Chest, Abdomen, Front perineal area, Buttocks, Right upper leg, Left upper leg, Right lower leg, Left lower leg, Back  Bathing assist Assist Level: 2 helpers      Upper Body Dressing/Undressing Upper body dressing   What is the patient wearing?: Hospital gown   Bra - Perfomed by helper: Thread/unthread right bra strap, Thread/unthread left bra strap, Hook/unhook bra (pull down sports bra)   Pull over shirt/dress - Perfomed by helper: Thread/unthread right sleeve, Thread/unthread left sleeve, Put head through opening, Pull shirt over trunk        Upper body assist Assist Level: 2 helpers      Lower Body Dressing/Undressing Lower body dressing   What is the patient wearing?: Hospital Gown       Pants- Performed by helper: Thread/unthread right pants leg, Pull pants up/down, Fasten/unfasten pants, Thread/unthread left pants leg  Socks - Performed by helper: Don/doff left sock, Don/doff right sock   Shoes - Performed by helper: Don/doff right shoe, Don/doff left shoe, Fasten right, Fasten left          Lower body assist Assist for lower body dressing: 2 Automotive engineer activity did not occur: No continent bowel/bladder  event   Toileting steps completed by helper: Adjust clothing prior to toileting, Performs perineal hygiene, Adjust clothing after toileting    Toileting assist Assist level: Two helpers   Transfers Chair/bed transfer   Chair/bed transfer method: Squat pivot Chair/bed transfer assist level: Total assist (Pt < 25%) Chair/bed transfer assistive device: Mechanical lift Mechanical lift: Stedy   Locomotion Ambulation Ambulation activity did not occur: Safety/medical Editor, commissioning activity did not occur: N/A Type: Manual (dependent in tilt in space chair )      Cognition Comprehension Comprehension assist level: Understands basic less than 25% of the time/ requires cueing >75% of the time, Understands basic 25 - 49% of the time/ requires cueing 50 - 75% of the time  Expression Expression assist level: Expresses basis less than 25% of the time/requires cueing >75% of the time.  Social Interaction Social Interaction assist level: Interacts appropriately less than 25% of the time. May be withdrawn or combative.  Problem Solving Problem solving assist level: Solves basic less than 25% of the time - needs direction nearly all the time or does not effectively solve problems and may need a restraint for safety  Memory Memory assist level: Recognizes or recalls less than 25% of the time/requires cueing greater than 75% of the time   Medical Problem List and Plan: 1. 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 DVT Prophylaxis/Anticoagulation: Pharmaceutical: Lovenox 3. Pain Management: legs are hypersensitive to touch which is something I noticed in the office with her last month too. Often inhibits activity    -continue low dose gabapentin and observe for response  (?some sedation) 4. Mood: LCSW to follow for evaluation and support.  5. Neuropsych: This patient is notcapable of making decisions on herown behalf. 6. Skin/Wound Care: Air mattress overlay. Routine pressure relief measures.  -appreciate [facility] RN recs 7. Fluids/Electrolytes/Nutrition: Strict I/O.    -osmolite 1.5 at 75cc/hrx 20 hr currently---increase to 80 cc/hr  -prealbumin up to 23.1  -weight stable   Filed Weights   10/13/16 0506 10/14/16 0549 10/15/16 0821  Weight: 37.9 kg (83 lb 8.7 oz) 34 kg (74 lb 15.3 oz) 37.6 kg (83 lb)     -RD follow up 8. MRSA tracheobronchitis: pt continues on iv vanc--clinically improved.   abx were initiated 8/31.    -given hyponatremia, stopped bactrim on 9/13 9. Hyponatremia: recurrent but improving again  -at 135 9/21  -continue free water  100 mL 3 times a day sodium continues to improve,         10. Urinary retention: Monitor voiding with PVR checks --cath for volumes > 350cc  -consider urecholine trial next week  -recent ucx neg 11. HTN: Monitor BP bid. Continue Norvasc  12. H/o constipation:   continue current regimen for now.  13. VDRF: Chest PT bid. ATC for humidification. Suction prn. HOB elevated, aspiration precautions  14. Hypokalemia: Improved, potassium normal at 4.0  15. Dyskinesias/tremors: mysoline   - 50mg  at 0700, 1200 and 50mg  QHS--   -if too sedating then we might need to back off   -valium: stopped, prn klonopin   -have asked [facility] to fit patient with adjustable hinged/locking left knee brace 16. Breast cancer with hx of lumpectomy  -Appreciate Dr. Virgie Dad follow up. Arimidex to resume once discharged home  LOS (Days) Ramsey T, MD 10/15/2016 9:49 AM

## 2016-10-15 NOTE — Progress Notes (Signed)
Speech Language Pathology Weekly Progress and Session Note  Patient Details  Name: Judy Spears MRN: [id number] Date of Birth: 1950-04-09  Beginning of progress report period: October 08, 2016 End of progress report period: October 15, 2016  Today's Date: 10/15/2016 SLP Individual Time: 1000-1100 SLP Individual Time Calculation (min): 60 min  Short Term Goals: Week 2: SLP Short Term Goal 1 (Week 2): Pt will maintain arousal  in regards to keeping her eyes open for ~ 30 minutes with Min A multimodal cues.  SLP Short Term Goal 1 - Progress (Week 2): Met SLP Short Term Goal 2 (Week 2): Given Max A multimodal cues, pt will increase speech intelligibility at the simple word level to ~25%. SLP Short Term Goal 2 - Progress (Week 2): Not met SLP Short Term Goal 3 (Week 2): Given Max A cues, pt with return demonstrate various blowing exercises to increase respiratory support for vocalizations.  SLP Short Term Goal 3 - Progress (Week 2): Not met SLP Short Term Goal 4 (Week 2): Given Max A multimodal cues and oral stimulation, pt will initiate swallow in 50% of trials.  SLP Short Term Goal 4 - Progress (Week 2): Not met    New Short Term Goals: Week 3: SLP Short Term Goal 1 (Week 3): Pt will focus attention on task or speaker for 30 seconds with Max A multimodal cues.  SLP Short Term Goal 2 (Week 3): Given Max A multimodal cues, pt will increase speech intelligibility at the simple word level to ~25%. SLP Short Term Goal 3 (Week 3): Given Max A cues, pt with return demonstrate various blowing exercises to increase respiratory support for vocalizations.  SLP Short Term Goal 4 (Week 3): Given Max A multimodal cues and oral stimulation, pt will initiate swallow in 50% of trials.   Weekly Progress Updates: Pt has made minimal progress this reporting period and as a result, she has met 1 of 4 goals. Pt is able to maintain arousal but is not able to focus attention to task or speaker/therapist. Pt  continues to require Total A - visual, tactile, verbal - extensive cues to produce minimal voicing, prefunctional respiratory exercises as well as cognitive tasks such as focused attention. With oral stimulation and Total cues, pt has not demonstrated any swallow initiation. Pt continues to require skilled ST to target these deficits and decrease caregiver burden.       Intensity: Minumum of 1-2 x/day, 30 to 90 minutes Frequency: 3 to 5 out of 7 days Duration/Length of Stay: 2 weeks Treatment/Interventions: Cognitive remediation/compensation;Cueing hierarchy;Functional tasks;Patient/family education;Speech/Language facilitation;Dysphagia/aspiration precaution training   Daily Session  Skilled Therapeutic Interventions: Skilled treatment session focused on speech communication goals including self-care education with family and prefunctional tasks. Pt required Total A multimodal cues for focused attention - although she shifted attention quicker than previous session. With Total A cues, pt with increased voclizations to ~ 25% of words/utterances but continues to be unintelligible. Pt with mild vocal wetness and attempts to cough but unable to follow any cues to imitate cough or throat clear. Of note, pt with increased anterior spillage of own saliva. All questions answered to husband's satisfaction Pt left with PMSV in place and in husband's care. Continue per current plan of care.     Function:     Cognition Comprehension Comprehension assist level: Understands basic less than 25% of the time/ requires cueing >75% of the time  Expression Expression assistive device: Talk trach valve Expression assist level: Expresses basis less  than 25% of the time/requires cueing >75% of the time.  Social Interaction Social Interaction assist level: Interacts appropriately less than 25% of the time. May be withdrawn or combative.  Problem Solving Problem solving assist level: Solves basic less than 25% of the  time - needs direction nearly all the time or does not effectively solve problems and may need a restraint for safety  Memory Memory assist level: Recognizes or recalls less than 25% of the time/requires cueing greater than 75% of the time   General    Pain Pain Assessment Pain Assessment: Faces Faces Pain Scale: No hurt  Therapy/Group: Individual Therapy  Mickey Hebel 10/15/2016, 12:23 PM

## 2016-10-16 ENCOUNTER — Encounter (HOSPITAL_COMMUNITY): Payer: Self-pay

## 2016-10-16 ENCOUNTER — Inpatient Hospital Stay (HOSPITAL_COMMUNITY): Payer: Medicare Other

## 2016-10-16 DIAGNOSIS — E43 Unspecified severe protein-calorie malnutrition: Secondary | ICD-10-CM

## 2016-10-16 LAB — CBC
HEMATOCRIT: 41 % (ref 36.0–46.0)
HEMOGLOBIN: 12.9 g/dL (ref 12.0–15.0)
MCH: 27.4 pg (ref 26.0–34.0)
MCHC: 31.5 g/dL (ref 30.0–36.0)
MCV: 87 fL (ref 78.0–100.0)
Platelets: 413 10*3/uL — ABNORMAL HIGH (ref 150–400)
RBC: 4.71 MIL/uL (ref 3.87–5.11)
RDW: 14 % (ref 11.5–15.5)
WBC: 7.8 10*3/uL (ref 4.0–10.5)

## 2016-10-16 LAB — GLUCOSE, CAPILLARY
GLUCOSE-CAPILLARY: 118 mg/dL — AB (ref 65–99)
GLUCOSE-CAPILLARY: 146 mg/dL — AB (ref 65–99)
GLUCOSE-CAPILLARY: 155 mg/dL — AB (ref 65–99)
GLUCOSE-CAPILLARY: 160 mg/dL — AB (ref 65–99)
Glucose-Capillary: 114 mg/dL — ABNORMAL HIGH (ref 65–99)

## 2016-10-16 MED ORDER — PRO-STAT SUGAR FREE PO LIQD
30.0000 mL | Freq: Two times a day (BID) | ORAL | Status: DC
  Administered 2016-10-16 – 2016-10-27 (×22): 30 mL
  Filled 2016-10-16 (×22): qty 30

## 2016-10-16 MED ORDER — FLUCONAZOLE 100 MG PO TABS
100.0000 mg | ORAL_TABLET | Freq: Every day | ORAL | Status: AC
  Administered 2016-10-16 – 2016-10-18 (×3): 100 mg
  Filled 2016-10-16 (×3): qty 1

## 2016-10-16 MED ORDER — OSMOLITE 1.5 CAL PO LIQD
1000.0000 mL | ORAL | Status: DC
  Administered 2016-10-16 – 2016-10-27 (×17): 1000 mL
  Filled 2016-10-16 (×18): qty 1000

## 2016-10-16 MED ORDER — CIPROFLOXACIN HCL 250 MG PO TABS
250.0000 mg | ORAL_TABLET | Freq: Two times a day (BID) | ORAL | Status: DC
  Administered 2016-10-16 – 2016-10-24 (×16): 250 mg
  Filled 2016-10-16 (×16): qty 1

## 2016-10-16 NOTE — Progress Notes (Signed)
Family concerned about weight loss. Discussed with MD and dietary consult ordered. Dietician requests nursing and therapy coordinate to make sure tube feed is promptly restarted between therapy sessions. Family requests patient's weight be written on the whiteboard in the room to facilitate communication. Note left on board behind patient's bed with request and in sticky file for team. Will discuss with oncoming RN.

## 2016-10-16 NOTE — Plan of Care (Signed)
Problem: RH BOWEL ELIMINATION Goal: RH STG MANAGE BOWEL WITH ASSISTANCE STG Manage Bowel with  Total Assistance.  Outcome: Not Progressing No bowel movement since the 18th, discussed with family,  given Bisacodyl suppository

## 2016-10-16 NOTE — Plan of Care (Signed)
Problem: RH SKIN INTEGRITY Goal: RH STG SKIN FREE OF INFECTION/BREAKDOWN Pt skin will remain free from further skin breakdown or infection while hospitalized with total assist  Outcome: Progressing No skin infection noted, skin care completed, mild redness remains to sacrum

## 2016-10-16 NOTE — Progress Notes (Signed)
Trach changed per hospital policy.  Patients husband brought a #4 cuff less Shiley trach with disposable inner cannula to replace current trach.  The hospital only supplies #4 cuff less with non-disposable inner cannula.  Lurline Idol was changed without complication.  Positive CO2 exchange noted.

## 2016-10-16 NOTE — Progress Notes (Signed)
Occupational Therapy Session Note  Patient Details  Name: Judy Spears MRN: [id number] Date of Birth: 1950/04/18  Today's Date: 10/16/2016 OT Individual Time: 8563-1497 OT Individual Time Calculation (min): 59 min     Skilled Therapeutic Interventions/Progress Updates:    1:1. Pt husband present throughout session. Pt rolls B initiating reach with hand with MAX A for OT to advance pants past hips. Husband assist pt with UB dressing as OT provides MOD A for sitting balance EOB. PT able to initiate threading LUE into shirt sleeve. Husband provides TOTAL squat pivot using bobath technique with VC for body mechanics. Pt completes slide board transfer with MAX A and HOH A for hand placement throughout trnasition with a step under feet to decrease spasms in BLE and Dycem under board on BSC. OT educates husband on uses of Dycem during transfers. OT propels w/c to gym and completes bobath transfer with TOTAL A to EOM with pt initiating anterior weight shift. While seated EOM pt reaches for targets on table to R outside BOS to facilitate anterior and lateral weight shift to R. Pt able to initiate reach/grasp of target 50% of trials. Exited session with pt seated in w/c with call light in reach, husband present, and all needs met.  Therapy Documentation Precautions:  Precautions Precautions: Fall Precaution Comments: Trach, PEG Restrictions Weight Bearing Restrictions: No Pain: Pain Assessment Pain Assessment: No/denies pain  See Function Navigator for Current Functional Status.   Therapy/Group: Individual Therapy  Tonny Branch 10/16/2016, 12:27 PM

## 2016-10-16 NOTE — Progress Notes (Signed)
Called to room by RN to assess patient for low O2 saturations.  Sats 75% on 98% ATC. Patient was suctioned for moderate amount of thin white secretions, tolerated well.  Sat probe moved from finger (patient has rhythmic motion of hand) to toe with sats improving to 99%.   FiO2 dropped to 40%, sats holding at 98%.  Patient does not appear distressed, RN at bedside.

## 2016-10-16 NOTE — Progress Notes (Signed)
[facility] PHYSICAL MEDICINE & REHABILITATION     PROGRESS NOTE    Subjective/Complaints: Pt with suspicious appearing/smelling urine yesterday. Has had intermittent retention. Low grade temp.   No new issues over night  ROS: Limited due to cognitive/behavioral   Objective: Vital Signs: Blood pressure (!) 137/96, pulse 81, temperature 100 F (37.8 C), temperature source Other (Comment), resp. rate 18, height 5\' 2"  (1.575 m), weight 37.8 kg (83 lb 5.3 oz), last menstrual period 12/01/2010, SpO2 98 %. No results found.  Recent Labs  10/16/16 0611  WBC 7.8  HGB 12.9  HCT 41.0  PLT 413*    Recent Labs  10/15/16 0414  NA 135  K 3.6  CL 93*  GLUCOSE 135*  BUN 13  CREATININE 0.37*  CALCIUM 9.2   CBG (last 3)   Recent Labs  10/15/16 1810 10/16/16 0016 10/16/16 0554  GLUCAP 148* 114* 118*    Wt Readings from Last 3 Encounters:  10/16/16 37.8 kg (83 lb 5.3 oz)  09/30/16 44.2 kg (97 lb 6.4 oz)  07/14/16 46.5 kg (102 lb 9.6 oz)    Physical Exam:  Constitutional:   Remains cachectic. No distress.  HENT:  Head: NCAT Mouth/Throat: Oropharynx is clear but dry  Eyes: Pupils are equal, round, and reactive to light. Conjunctivaeare normal.  Neck: minimal secretions from trach, tolerates. Passy Muir valve   . Cardiovascular: RRR without murmur. No JVD   .    Respiratory: CTA Bilaterally without wheezes or rales. Normal effort  GI: Soft. Bowel sounds are normal. She exhibits no distension. There is no tenderness.   Musculoskeletal: She exhibits no edemaor tenderness.  Neuro:   alert. Ongoing flexor tone in UE's with rhythmic movements /dyskinesias noted. Clonus like movements of left patella persist today. LE's with hamstring tightness LLE--was able to get her nearly to -30 to -25 degrees, extensor tone RLE.  Is sensitive to ROM particularly on LLE, but RLE is sensitive too.    Skin: a few scattered abrasions. Mild yeast around vaginal/pelvic area Psych:  flat  Assessment/Plan: 1. Functional and mobility deficits secondary to recent [place] encephalitis and subsequent pneumonia/bronchitis/respiratory sequelae which require 3+ hours per day of interdisciplinary therapy in a comprehensive inpatient rehab setting. Physiatrist is providing close team supervision and 24 hour management of active medical problems listed below. Physiatrist and rehab team continue to assess barriers to discharge/monitor patient progress toward functional and medical goals.  Function:  Bathing Bathing position Bathing activity did not occur: Safety/medical concerns (total A bed level night bath) Position: Bed  Bathing parts   Body parts bathed by helper: Right arm, Left arm, Chest, Abdomen, Front perineal area, Buttocks, Right upper leg, Left upper leg, Right lower leg, Left lower leg, Back  Bathing assist Assist Level: 2 helpers      Upper Body Dressing/Undressing Upper body dressing   What is the patient wearing?: Hospital gown   Bra - Perfomed by helper: Thread/unthread right bra strap, Thread/unthread left bra strap, Hook/unhook bra (pull down sports bra)   Pull over shirt/dress - Perfomed by helper: Thread/unthread right sleeve, Thread/unthread left sleeve, Put head through opening, Pull shirt over trunk        Upper body assist Assist Level: 2 helpers      Lower Body Dressing/Undressing Lower body dressing   What is the patient wearing?: Hospital Gown       Pants- Performed by helper: Thread/unthread right pants leg, Pull pants up/down, Fasten/unfasten pants, Thread/unthread left pants leg  Socks - Performed by helper: Don/doff left sock, Don/doff right sock   Shoes - Performed by helper: Don/doff right shoe, Don/doff left shoe, Fasten right, Fasten left          Lower body assist Assist for lower body dressing: 2 Automotive engineer activity did not occur: No continent bowel/bladder event   Toileting  steps completed by helper: Adjust clothing prior to toileting, Performs perineal hygiene, Adjust clothing after toileting    Toileting assist Assist level: Two helpers   Transfers Chair/bed transfer   Chair/bed transfer method: Squat pivot Chair/bed transfer assist level: Total assist (Pt < 25%) Chair/bed transfer assistive device: Mechanical lift Mechanical lift: Stedy   Locomotion Ambulation Ambulation activity did not occur: Safety/medical Editor, commissioning activity did not occur: N/A Type: Manual (dependent in tilt in space chair )      Cognition Comprehension Comprehension assist level: Understands basic less than 25% of the time/ requires cueing >75% of the time  Expression Expression assist level: Expresses basis less than 25% of the time/requires cueing >75% of the time.  Social Interaction Social Interaction assist level: Interacts appropriately less than 25% of the time. May be withdrawn or combative.  Problem Solving Problem solving assist level: Solves basic less than 25% of the time - needs direction nearly all the time or does not effectively solve problems and may need a restraint for safety  Memory Memory assist level: Recognizes or recalls less than 25% of the time/requires cueing greater than 75% of the time   Medical Problem List and Plan: 1. 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 DVT Prophylaxis/Anticoagulation: Pharmaceutical: Lovenox 3. Pain Management: legs are hypersensitive to touch which is something I noticed in the office with her last month too. Often inhibits activity    -continue low dose gabapentin and observe for response (?some sedation) 4. Mood: LCSW to follow for evaluation and support.  5. Neuropsych: This  patient is notcapable of making decisions on herown behalf. 6. Skin/Wound Care: Air mattress overlay. Routine pressure relief measures.  -appreciate WOC RN recs  -will given 3 dose of diflucan for yeast in vaginal/groin area 7. Fluids/Electrolytes/Nutrition: Strict I/O.    -osmolite 1.5 at 75cc/hrx 20 hr currently---increase to 80 cc/hr  -prealbumin up to 23.1  -weight stable  -I have been reviewing nutrition DAILY with husband   Filed Weights   10/14/16 0549 10/15/16 0821 10/16/16 0600  Weight: 34 kg (74 lb 15.3 oz) 37.6 kg (83 lb) 37.8 kg (83 lb 5.3 oz)     -RD follow up 8. MRSA tracheobronchitis: pt continues on iv vanc--clinically improved.   abx were initiated 8/31.    -given hyponatremia, stopped bactrim on 9/13 9. Hyponatremia: recurrent but improving again  -at 135 9/21  -continue free water  100 mL 3 times a day sodium continues to improve,         10. Urinary retention: Monitor voiding with PVR checks --cath for volumes > 350cc  -consider urecholine trial next week  -urine suspicious for infection yesterday   -low grade temp, UA +---empiric bactrim begun   11. HTN: Monitor BP bid. Continue Norvasc  12. H/o constipation:   continue current regimen for now.  13. VDRF: Chest PT  bid. ATC for humidification. Suction prn. HOB elevated, aspiration precautions  14. Hypokalemia: Improved, potassium normal at 4.0 15. Dyskinesias/tremors: mysoline   - 50mg  at 0700, 1200 and 50mg  QHS--   -if too sedating then we might need to back off   -valium: stopped, prn klonopin   -have asked [facility] to fit patient with adjustable hinged/locking left knee brace 16. Breast cancer with hx of lumpectomy  -Appreciate Dr. Virgie Dad follow up. Arimidex to resume once discharged home   LOS (Days) [place] T, MD 10/16/2016 9:33 AM

## 2016-10-16 NOTE — Progress Notes (Signed)
Nutrition Follow-up  DOCUMENTATION CODES:   Severe malnutrition in context of chronic illness, Underweight  INTERVENTION:   Continue daily weights.  Staff to write daily weight on white board in patient's room for better communication with family  Staff to ensure pt is hooked up to TF after therapy is completed - Discussed with bedside RN and Games developer  Continue current TF regimen: Osmolite 1.5 @ 85 ml/hr x 20 hrs 30 ml Prostat BID  100 ml free water TID and HS  Provides: 2750 kcal, 136 grams protein, and 1295 ml free water.  Total free water: 1695 ml   Discussed plan with family.  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 Ongoing.   GOAL:   Patient will meet greater than or equal to 90% of their needs Progressing.   MONITOR:   TF tolerance, Skin, Weight trends, Labs, I & O's  ASSESSMENT:   66 y.o. 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 She was admitted on 09/24/16 with lethargy. SOB and low grade fevers due to MRSA tracheobronchitis v/s PNA. PEG tube evaluated by IVR and was exchanged out on 9/5. Patient has had significant decline in mobility and ability to carryout ADL tasks. CIR recommended by rehab team.  Paged by Dr [name], family concerned about weight loss   Discussed history with pt's husband and daughter and family member.  Per their report pt was eating full meals (chicken, tomato pie, squash casserole, and wine) and drinking 2 boost per day as well as receiving 1800 kcal and 75 grams protein per day from her nocturnal tube feedings.  Despite good intake and tolerance of TF pt was still losing weight, of note pt was  having a lot of involuntary movements per family PTA.   Per review of weight hx pt has lost 9 lb or 10% of her body weight in 16 days.  Likely contributing: a higher metabolic rate from involuntary movements, pt was likely receiving >2300 kcal at home PTA and was still losing weight despite this, possible disease process contributing as well, time pt not receiving feedings may be > 4 hours if pt not hooked up to feeding right away after therapy is completed (family reports seeing this happen).   Family expressed frustration with not knowing pt was losing weight.    9/6 TF ordered to provide same kcal/protein as home TF regimen 9/17 TF increased - weight stabilized  9/20 TF increased 9/22 TF increased  Diet Order:  Diet NPO time specified  Skin:  Reviewed, no issues  Last BM:  9/22  Height:   Ht Readings from Last 1 Encounters:  09/30/16 5\' 2"  (1.575 m)    Weight:   Wt Readings from Last 1 Encounters:  10/16/16 83 lb 5.3 oz (37.8 kg)    Ideal Body Weight:  50 kg  BMI:  Body mass index is 15.24 kg/m.  Estimated Nutritional Needs:   Kcal:  1700-1900  Protein:  65-75 gram  Fluid:  1.7-1.9 L/day  EDUCATION NEEDS:   No education needs identified at this time  [place], [place], [place] Pager [phone] After Hours Pager

## 2016-10-17 ENCOUNTER — Inpatient Hospital Stay (HOSPITAL_COMMUNITY): Payer: Medicare Other | Admitting: Occupational Therapy

## 2016-10-17 DIAGNOSIS — N390 Urinary tract infection, site not specified: Secondary | ICD-10-CM

## 2016-10-17 DIAGNOSIS — A499 Bacterial infection, unspecified: Secondary | ICD-10-CM

## 2016-10-17 LAB — GLUCOSE, CAPILLARY
GLUCOSE-CAPILLARY: 91 mg/dL (ref 65–99)
GLUCOSE-CAPILLARY: 110 mg/dL — AB (ref 65–99)
Glucose-Capillary: 124 mg/dL — ABNORMAL HIGH (ref 65–99)
Glucose-Capillary: 95 mg/dL (ref 65–99)

## 2016-10-17 LAB — URINE CULTURE: Culture: >=100000 — AB

## 2016-10-17 MED ORDER — AMLODIPINE 1 MG/ML ORAL SUSPENSION
2.5000 mg | Freq: Once | ORAL | Status: AC
  Administered 2016-10-17: 2.5 mg via ORAL
  Filled 2016-10-17: qty 2.5

## 2016-10-17 MED ORDER — FLORANEX PO PACK
1.0000 g | PACK | Freq: Every day | ORAL | Status: DC
  Administered 2016-10-17 – 2016-10-27 (×11): 1 g
  Filled 2016-10-17 (×11): qty 1

## 2016-10-17 MED ORDER — AMLODIPINE 1 MG/ML ORAL SUSPENSION
10.0000 mg | Freq: Every day | ORAL | Status: DC
  Filled 2016-10-17 (×3): qty 10

## 2016-10-17 MED ORDER — BIOGAIA PROBIOTIC PO LIQD
30.0000 mL | Freq: Every day | ORAL | Status: DC
  Filled 2016-10-17: qty 30

## 2016-10-17 NOTE — Progress Notes (Signed)
Occupational Therapy Session Note  Patient Details  Name: Judy Spears MRN: [id number] Date of Birth: [birth year]  Today's Date: 10/17/2016 OT Individual Time: 1123-1206 OT Individual Time Calculation (min): 43 min   Skilled Therapeutic Interventions/Progress Updates:    Tx focus on initiation, trunk control, and visual scaning during meaningful leisure engagement.   Pt greeted in TIS with spouse present, dressed and ready to go. Pt was escorted to family room where she engaged in plant care tasks. Mod-Max A initially for flexing trunk to fill up small watering can at sink. HOH assist for extending Rt elbow and opening hand for faucet controls. Pt initiating turning faucet on/off appropriately herself. Pt pointing to flowers in room she wanted to water, able to mouth colors of plants with extra time and phonetic cueing. Unable to verbalize audibly. Near end of session pt leaning forward and to Rt side with Mod A to water the base of a tree! Pt actively assisting OT with guiding Rt arm towards plant pots and away from them after she was done watering. Pt indicating yes/no responses via head nods/shakes appropriately throughout session (i.e. Do you see the water coming from the can?)  At end of tx pt was escorted back to room and placed back on room 02 (pts sats 100% on supplemental 02). Pt repositioned in TIS with 2 helpers, left with spouse at time of departure.    Pt nodding yes at end of tx when asked if she had fun.  Therapy Documentation Precautions:  Precautions Precautions: Fall Precaution Comments: Trach, PEG Restrictions Weight Bearing Restrictions: No Vital Signs: Therapy Vitals Pulse Rate: 91 Resp: 16 BP: (!) 162/94 Patient Position (if appropriate): Lying Oxygen Therapy SpO2: 100 % O2 Device: Tracheostomy Collar O2 Flow Rate (L/min): 5 L/min FiO2 (%): 28 % Pain: No c/o or expressions of pain   ADL:     See Function Navigator for Current Functional  Status.  Therapy/Group: Individual Therapy  [name] A Harshan Kearley 10/17/2016, 12:29 PM

## 2016-10-17 NOTE — Progress Notes (Signed)
[place] PHYSICAL MEDICINE & REHABILITATION     PROGRESS NOTE    Subjective/Complaints: Pt without issues overnight. Alert and making eye contact when I arrived in room this morning.   ROS: Limited due to cognitive/behavioral   Objective: Vital Signs: Blood pressure (!) 162/94, pulse 91, temperature 98.5 F (36.9 C), temperature source Axillary, resp. rate 16, height 5\' 2"  (1.575 m), weight 19.5 kg (43 lb), last menstrual period 12/01/2010, SpO2 100 %. No results found.  Recent Labs  10/16/16 0611  WBC 7.8  HGB 12.9  HCT 41.0  PLT 413*    Recent Labs  10/15/16 0414  NA 135  K 3.6  CL 93*  GLUCOSE 135*  BUN 13  CREATININE 0.37*  CALCIUM 9.2   CBG (last 3)   Recent Labs  10/16/16 1821 10/16/16 2353 10/17/16 0641  GLUCAP 155* 160* 124*    Wt Readings from Last 3 Encounters:  10/17/16 19.5 kg (43 lb)  09/30/16 44.2 kg (97 lb 6.4 oz)  07/14/16 46.5 kg (102 lb 9.6 oz)    Physical Exam:  Constitutional:   Remains cachectic. No distress.  HENT:  Head: NCAT Mouth/Throat: Oropharynx is dry Eyes: Pupils are equal, round, and reactive to light. Conjunctivaeare normal.  Neck: minimal secretions from trach, tolerates. Passy Muir valve   . Cardiovascular:  RRR without murmur. No JVD    .    Respiratory: CTA Bilaterally without wheezes or rales. Normal effort   GI: Soft. Bowel sounds are normal. She exhibits no distension. There is no tenderness.   Musculoskeletal: She exhibits no edemaor tenderness.  Neuro:   alert. Ongoing flexor tone in UE's with rhythmic movements /dyskinesias noted. Clonus like movements of left patella persist today. LLE flexor tone,  extensor tone RLE.  Is sensitive to ROM particularly on LLE, but RLE is sensitive too.    Skin: a few scattered abrasions. some yeast around vaginal/pelvic area Psych: flat  Assessment/Plan: 1. Functional and mobility deficits secondary to recent [place] encephalitis and subsequent  pneumonia/bronchitis/respiratory sequelae which require 3+ hours per day of interdisciplinary therapy in a comprehensive inpatient rehab setting. Physiatrist is providing close team supervision and 24 hour management of active medical problems listed below. Physiatrist and rehab team continue to assess barriers to discharge/monitor patient progress toward functional and medical goals.  Function:  Bathing Bathing position Bathing activity did not occur: Safety/medical concerns (total A bed level night bath) Position: Bed  Bathing parts   Body parts bathed by helper: Right arm, Left arm, Chest, Abdomen, Front perineal area, Buttocks, Right upper leg, Left upper leg, Right lower leg, Left lower leg, Back  Bathing assist Assist Level: 2 helpers      Upper Body Dressing/Undressing Upper body dressing   What is the patient wearing?: Hospital gown   Bra - Perfomed by helper: Thread/unthread right bra strap, Thread/unthread left bra strap, Hook/unhook bra (pull down sports bra)   Pull over shirt/dress - Perfomed by helper: Thread/unthread right sleeve, Thread/unthread left sleeve, Put head through opening, Pull shirt over trunk        Upper body assist Assist Level: 2 helpers      Lower Body Dressing/Undressing Lower body dressing   What is the patient wearing?: Hospital Gown       Pants- Performed by helper: Thread/unthread right pants leg, Pull pants up/down, Fasten/unfasten pants, Thread/unthread left pants leg       Socks - Performed by helper: Don/doff left sock, Don/doff right sock   Shoes - Performed  by helper: Don/doff right shoe, Don/doff left shoe, Fasten right, Fasten left          Lower body assist Assist for lower body dressing: 2 Helpers      Toileting Toileting Toileting activity did not occur: No continent bowel/bladder event   Toileting steps completed by helper: Adjust clothing prior to toileting, Performs perineal hygiene, Adjust clothing after toileting     Toileting assist Assist level: Two helpers   Transfers Chair/bed transfer   Chair/bed transfer method: Squat pivot Chair/bed transfer assist level: Total assist (Pt < 25%) Chair/bed transfer assistive device: Mechanical lift Mechanical lift: Stedy   Locomotion Ambulation Ambulation activity did not occur: Safety/medical Editor, commissioning activity did not occur: N/A Type: Manual (dependent in tilt in space chair )      Cognition Comprehension Comprehension assist level: Understands basic less than 25% of the time/ requires cueing >75% of the time  Expression Expression assist level: Expresses basis less than 25% of the time/requires cueing >75% of the time.  Social Interaction Social Interaction assist level: Interacts appropriately less than 25% of the time. May be withdrawn or combative.  Problem Solving Problem solving assist level: Solves basic less than 25% of the time - needs direction nearly all the time or does not effectively solve problems and may need a restraint for safety  Memory Memory assist level: Recognizes or recalls less than 25% of the time/requires cueing greater than 75% of the time   Medical Problem List and Plan: 1. 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 DVT Prophylaxis/Anticoagulation: Pharmaceutical: Lovenox 3. Pain Management: legs are hypersensitive to touch which is something I noticed in the office with her last month too. Often inhibits activity    -continue low dose gabapentin and observe for response   4. Mood: LCSW to follow for evaluation and support.  5. Neuropsych: This patient is notcapable of making decisions on herown behalf. 6. Skin/Wound Care: Air mattress overlay. Routine pressure relief  measures.  -appreciate WOC RN recs  -will given 3 dose of diflucan for yeast in vaginal/groin area 7. Fluids/Electrolytes/Nutrition:      -osmolite 1.5 at 85cc/hr currently, prostat BID as well  -prealbumin up to 23.1 most recently  -weight stable, (pending today)  -I have been reviewing nutrition DAILY with husband and team. RD has spoken with family and nursing staff to establish better communication regarding nutritional status. I spoke with husband extensively yesterday   Filed Weights   10/15/16 0821 10/16/16 0600 10/17/16 0314  Weight: 37.6 kg (83 lb) 37.8 kg (83 lb 5.3 oz) 19.5 kg (43 lb)     -RD follow up 8. MRSA tracheobronchitis: pt continues on iv vanc--clinically improved.   abx were initiated 8/31.    -given hyponatremia, stopped bactrim on 9/13 9. Hyponatremia: recurrent but improving again  -at 135 9/21  -continue free water  100 mL 3 times a day sodium continues to improve  -recheck tomorrow         10. Urinary retention: PVR's 100-272 cc,  -I and O cath prn  -consider urecholine trial next week  -urine culture positive for pseudomonas--I changed abx to cipro yesterday which it is sensitive to upon review of today's results---continue for 7 days total   11. HTN:  Trending  up slightly. Increased to norvasc 10mg  daily as of today  12. H/o constipation:   continue current regimen for now.  13. VDRF: Chest PT bid. ATC for humidification. Suction prn. HOB elevated, aspiration precautions  14. Hypokalemia: Improved, potassium normal at 4.0 15. Dyskinesias/tremors: mysoline   - 50mg  at 0700, 1200 and 50mg  QHS--seems to be tolerating   -valium: stopped, prn klonopin   -have asked [facility] to fit patient with adjustable hinged/locking left knee brace 16. Breast cancer with hx of lumpectomy  -Appreciate Dr. Virgie Dad follow up. Arimidex to resume once discharged home   LOS (Days) [place] T, MD 10/17/2016 9:08 AM

## 2016-10-18 ENCOUNTER — Inpatient Hospital Stay (HOSPITAL_COMMUNITY): Payer: Medicare Other | Admitting: Speech Pathology

## 2016-10-18 ENCOUNTER — Inpatient Hospital Stay (HOSPITAL_COMMUNITY): Payer: Medicare Other | Admitting: Physical Therapy

## 2016-10-18 ENCOUNTER — Inpatient Hospital Stay (HOSPITAL_COMMUNITY): Payer: Medicare Other | Admitting: *Deleted

## 2016-10-18 ENCOUNTER — Inpatient Hospital Stay (HOSPITAL_COMMUNITY): Payer: Medicare Other | Admitting: Occupational Therapy

## 2016-10-18 ENCOUNTER — Ambulatory Visit: Payer: Medicare Other | Admitting: Physical Medicine & Rehabilitation

## 2016-10-18 LAB — GLUCOSE, CAPILLARY
GLUCOSE-CAPILLARY: 147 mg/dL — AB (ref 65–99)
GLUCOSE-CAPILLARY: 125 mg/dL — AB (ref 65–99)
Glucose-Capillary: 126 mg/dL — ABNORMAL HIGH (ref 65–99)
Glucose-Capillary: 87 mg/dL (ref 65–99)

## 2016-10-18 MED ORDER — AMLODIPINE BESYLATE 10 MG PO TABS
10.0000 mg | ORAL_TABLET | Freq: Every day | ORAL | Status: DC
  Administered 2016-10-18 – 2016-10-25 (×8): 10 mg
  Filled 2016-10-18 (×8): qty 1

## 2016-10-18 MED ORDER — BETHANECHOL CHLORIDE 10 MG PO TABS
10.0000 mg | ORAL_TABLET | Freq: Three times a day (TID) | ORAL | Status: DC
  Administered 2016-10-18 – 2016-10-19 (×5): 10 mg
  Filled 2016-10-18 (×6): qty 1

## 2016-10-18 NOTE — Progress Notes (Signed)
Nutrition Follow-up  DOCUMENTATION CODES:   Severe malnutrition in context of chronic illness, Underweight  INTERVENTION:   Continue current TF regimen via PEG:  Osmolite 1.5 at 85 ml/h x 20 hours per day to provide   Pro-stat 30 ml BID  Free water flushes 100 ml TID and HS  Provides 2750 kcal, 136 gm protein, 1695 ml total free water daily  Continue daily weights, RN is writing weight on white board in patient's room. Therapy to weigh patient on standing scale today for a more accurate weight.  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  Ongoing  GOAL:   Patient will meet greater than or equal to 90% of their needs  Met with TF  MONITOR:   TF tolerance, Skin, Weight trends, Labs, I & O's   ASSESSMENT:   66 y.o. 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 She was admitted on 09/24/16 with lethargy. SOB and low grade fevers due to MRSA tracheobronchitis v/s PNA. PEG tube evaluated by IVR and was exchanged out on 9/5. Patient has had significant decline in mobility and ability to carryout ADL tasks. CIR recommended by rehab team.  Spoke with patient, her husband, and her caregiver today. Patient's weight was 83 lbs 9/22, 43 lbs 9/23, and up to 96 lbs today. Husband questions accuracy of bed weights. Nursing/Therapy staff to weigh patient on a standing scale today.  Patient continues to tolerate TF without difficulty. RN has been restarting TF quickly after therapy finishes. Husband very appreciative of close monitoring of patient's weights.   Diet Order:  Diet NPO time specified  Skin:  Reviewed, no issues  Last BM:  9/22  Height:   Ht Readings from Last 1  Encounters:  09/30/16 '5\' 2"'$  (1.575 m)    Weight:   Wt Readings from Last 1 Encounters:  10/18/16 96 lb (43.5 kg)    Ideal Body Weight:  50 kg  BMI:  Body mass index is 17.56 kg/m.  Estimated Nutritional Needs:   Kcal:  1700-1900  Protein:  65-75 gram  Fluid:  1.7-1.9 L/day  EDUCATION NEEDS:   No education needs identified at this time  Molli Barrows, [place], [place], [place] Pager [phone] After Hours Pager [phone]

## 2016-10-18 NOTE — Progress Notes (Signed)
Pt working in gym with PT.

## 2016-10-18 NOTE — Progress Notes (Signed)
Occupational Therapy Session Note  Patient Details  Name: Judy Spears MRN: [id number] Date of Birth: [birth year]  Today's Date: 10/18/2016 OT Individual Time: 1000-1100 OT Individual Time Calculation (min): 60 min    Short Term Goals: Week 3:  OT Short Term Goal 1 (Week 3): STG=LTG due to LOS  Skilled Therapeutic Interventions/Progress Updates:    Pt seen for OT setssion focusing on functional transfers and upright sitting tolerance and balance. Pt sitting up in tilt-in-space w/c upon arrivalwith husband and personal caregiver present. Family requesting to have pt's hair washed.  Pt completed stand pivot transfer w/c<> BSC in simulation of home environment with pt's husband provided total A for transfer and second person for safety due to L LE flexor tone.  Pt positioned with pillows to facilitate midline orientation. Completed AROM for UEs while hair being washed, pt with 4/5 bicep/ tricep strength with VCs for attention to task. Pt with continent BM on BSC, pt able to obtain modified stand with max A +1 with second person to assist with clothing management/ hygiene. She then completed squat pivot transfer to standard w/c, pt's husband performing using Bobath technique. Pt tolerated sitting in standard w/c and with mod A, pt able to hold hair dryer while caregiver styled hair. Pt left seated in w/c at end of session family and RN present. Recommended changing back to tilt-in-space chair for long breaks btwn sessions.   Therapy Documentation Precautions:  Precautions Precautions: Fall Precaution Comments: Trach, PEG Restrictions Weight Bearing Restrictions: No Pain:   No/ denies pain  See Function Navigator for Current Functional Status.   Therapy/Group: Individual Therapy  [name], Dennise Raabe C 10/18/2016, 7:15 AM

## 2016-10-18 NOTE — Progress Notes (Signed)
Social Work Patient ID: Judy Spears, female   DOB: 1950/06/10, 66 y.o.   MRN: [id number]   Met with pt's spouse this morning who is expressing concern about pt's readiness for d/c end of week.  He does not feel her "strength level is where it needs to be."  He is pleased that her weight is improving but feels this must take place in order for "her muscle strength to improve."  He asked that I take these concerns to team conference tomorrow.  Rashan Rounsaville, LCSW

## 2016-10-18 NOTE — Progress Notes (Signed)
Physical Therapy Session Note  Patient Details  Name: Judy Spears MRN: [id number] Date of Birth: 1950/11/30  Today's Date: 10/18/2016 PT Individual Time: 1300-1400 PT Individual Time Calculation (min): 60 min   Short Term Goals: Week 3:  PT Short Term Goal 1 (Week 3): =LTG due to length of stay   Skilled Therapeutic Interventions/Progress Updates:   Pt received sitting up in chair, agreeable to therapy. Total assist for squat pivot transfer from mat<>chair. Pt sit to stand with mod assist +2 x 4. Pt able to remain standing with decreased support ~10 seconds. Pt able to communicate more today. With a few attempts, able to understand her asking "Please tell me how I am doing." Hip flexor stretch with meet of floor, supine on incline wedge x 2 ~3 min. Pt reported back pain with position. Pt sit<>supine total assist + 2. Pt performed 2 sets, 5 reps of glut bridges. Unable to lift off of mat but muscle activation noted with palpation. Pt left sitting up in chair, all needs in reach, caregiver present.   Therapy Documentation Precautions:  Precautions Precautions: Fall Precaution Comments: Trach, PEG Restrictions Weight Bearing Restrictions: No    See Function Navigator for Current Functional Status.   Therapy/Group: Individual Therapy  Gwinda Passe, SPT 10/18/2016, 2:53 PM

## 2016-10-18 NOTE — Progress Notes (Signed)
Physical Therapy Session Note  Patient Details  Name: Judy Spears MRN: [id number] Date of Birth: Feb 03, 1950  Today's Date: 10/18/2016 PT Individual Time: 1135-1205 PT Individual Time Calculation (min): 30 min   Short Term Goals: Week 3:  PT Short Term Goal 1 (Week 3): =LTG due to length of stay   Skilled Therapeutic Interventions/Progress Updates: Pt presented in w/c with husband requesting attempting weigh in on large scale. Pt transported to rehab gym, PTA supervision of caregiver performing squat pivot transfer to mat after initial weight-in. PTA assisted in L trunk/flank stretching to pt's tolerance. Caregiver transferred pt back to w/c in same manner as prior. Returned pt to room and supervised pt's husband performing squat pivot to [facility] for attempted void. Pt left on [facility] with husband, caregiver, and nsg present.      Therapy Documentation Precautions:  Precautions Precautions: Fall Precaution Comments: Trach, PEG Restrictions Weight Bearing Restrictions: No   See Function Navigator for Current Functional Status.   Therapy/Group: Individual Therapy  Judy Spears  Judy Spears, PTA  10/18/2016, 1:13 PM

## 2016-10-18 NOTE — Progress Notes (Signed)
[place] PHYSICAL MEDICINE & REHABILITATION     PROGRESS NOTE    Subjective/Complaints: No new issues. Had a reasonable night. Tolerating TF. No diarrhea. Urine still with odor  ROS: Limited due to cognitive/behavioral   Objective: Vital Signs: Blood pressure 127/71, pulse 83, temperature 97.7 F (36.5 C), temperature source Axillary, resp. rate 20, height 5\' 2"  (1.575 m), weight 43.5 kg (96 lb), last menstrual period 12/01/2010, SpO2 100 %. No results found.  Recent Labs  10/16/16 0611  WBC 7.8  HGB 12.9  HCT 41.0  PLT 413*   No results for input(s): NA, K, CL, GLUCOSE, BUN, CREATININE, CALCIUM in the last 72 hours.  Invalid input(s): CO CBG (last 3)   Recent Labs  10/17/16 1831 10/17/16 2347 10/18/16 0558  GLUCAP 110* 95 147*    Wt Readings from Last 3 Encounters:  10/18/16 43.5 kg (96 lb)  09/30/16 44.2 kg (97 lb 6.4 oz)  07/14/16 46.5 kg (102 lb 9.6 oz)    Physical Exam:  Constitutional:   Remains cachectic. No distress.  HENT:  Head: NCAT Mouth/Throat: Oropharynx is dry Eyes: PERRL Neck: trach in place. No secretions.  Cardiovascular:  RRR without murmur. No JVD    .    Respiratory: no rhonchi, normal effort  GI: Soft. Bowel sounds are normal. She exhibits no distension. There is no tenderness.   Musculoskeletal: fairly easily able to extend LLE to -25 degrees Neuro:   alert. Ongoing flexor tone in UE's with rhythmic movements /dyskinesias noted. Clonus like movements of left patella persist today. LLE flexor tone,  extensor tone RLE.  Is sensitive to ROM particularly on LLE, but RLE is sensitive too.    Skin: a few scattered abrasions. some yeast around vaginal/pelvic area Psych: flat  Assessment/Plan: 1. Functional and mobility deficits secondary to recent [place] encephalitis and subsequent pneumonia/bronchitis/respiratory sequelae which require 3+ hours per day of interdisciplinary therapy in a comprehensive inpatient rehab  setting. Physiatrist is providing close team supervision and 24 hour management of active medical problems listed below. Physiatrist and rehab team continue to assess barriers to discharge/monitor patient progress toward functional and medical goals.  Function:  Bathing Bathing position Bathing activity did not occur: Safety/medical concerns (total A bed level night bath) Position: Bed  Bathing parts   Body parts bathed by helper: Right arm, Left arm, Chest, Abdomen, Front perineal area, Buttocks, Right upper leg, Left upper leg, Right lower leg, Left lower leg, Back  Bathing assist Assist Level: 2 helpers      Upper Body Dressing/Undressing Upper body dressing   What is the patient wearing?: Hospital gown   Bra - Perfomed by helper: Thread/unthread right bra strap, Thread/unthread left bra strap, Hook/unhook bra (pull down sports bra)   Pull over shirt/dress - Perfomed by helper: Thread/unthread right sleeve, Thread/unthread left sleeve, Put head through opening, Pull shirt over trunk        Upper body assist Assist Level: 2 helpers      Lower Body Dressing/Undressing Lower body dressing   What is the patient wearing?: Hospital Gown       Pants- Performed by helper: Thread/unthread right pants leg, Pull pants up/down, Fasten/unfasten pants, Thread/unthread left pants leg       Socks - Performed by helper: Don/doff left sock, Don/doff right sock   Shoes - Performed by helper: Don/doff right shoe, Don/doff left shoe, Fasten right, Fasten left          Lower body assist Assist for lower body dressing:  2 Helpers      Naval architect activity did not occur: No continent bowel/bladder event   Toileting steps completed by helper: Adjust clothing prior to toileting, Performs perineal hygiene, Adjust clothing after toileting    Toileting assist Assist level: Two helpers   Transfers Chair/bed transfer   Chair/bed transfer method: Squat pivot Chair/bed  transfer assist level: Total assist (Pt < 25%) Chair/bed transfer assistive device: Mechanical lift Mechanical lift: Stedy   Locomotion Ambulation Ambulation activity did not occur: Safety/medical Editor, commissioning activity did not occur: N/A Type: Manual (dependent in tilt in space chair )      Cognition Comprehension Comprehension assist level: Understands basic less than 25% of the time/ requires cueing >75% of the time  Expression Expression assist level: Expresses basis less than 25% of the time/requires cueing >75% of the time.  Social Interaction Social Interaction assist level: Interacts appropriately less than 25% of the time. May be withdrawn or combative.  Problem Solving Problem solving assist level: Solves basic less than 25% of the time - needs direction nearly all the time or does not effectively solve problems and may need a restraint for safety  Memory Memory assist level: Recognizes or recalls 25 - 49% of the time/requires cueing 50 - 75% of the time   Medical Problem List and Plan: 1. 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 DVT Prophylaxis/Anticoagulation: Pharmaceutical: Lovenox 3. Pain Management: legs are hypersensitive to touch which is something I noticed in the office with her last month too. Often inhibits activity    -continue low dose gabapentin and observe for response   4. Mood: LCSW to follow for evaluation and support.  5. Neuropsych: This patient is notcapable of making decisions on herown behalf. 6. Skin/Wound Care: Air mattress overlay. Routine pressure relief measures.  -appreciate WOC RN recs  - 3 dose of diflucan for yeast in vaginal/groin area 7. Fluids/Electrolytes/Nutrition:      -osmolite 1.5 at 85cc/hr currently,  prostat BID as well  -prealbumin up to 23.1 most recently  -weight up to 96 lbs today!  -team working with family to ensure appropriate communication regarding diet/weights  Filed Weights   10/16/16 0600 10/17/16 0314 10/18/16 0530  Weight: 37.8 kg (83 lb 5.3 oz) 19.5 kg (43 lb) 43.5 kg (96 lb)     -RD follow up 8. MRSA tracheobronchitis: completed abx for this -  9. Hyponatremia: recurrent but improving again  -at 135 9/21  -continue free water  100 mL 3 times a day sodium continues to improve  -recheck Tuesday      10. Urinary retention: PVR's 100-272 cc,  -I and O cath prn (continues to require)  -begin urecholine trial   -urine culture positive for pseudomonas--changed to cipro on 9/22---continue for 7 day course (probiotic added)   11. HTN:  Trending up slightly. Increased to norvasc 10mg  daily as of today  12. H/o constipation:   continue current regimen for now.  13. VDRF: Chest PT bid. ATC for humidification. Suction prn. HOB elevated, aspiration precautions  14. Hypokalemia: Improved, potassium normal 15. Dyskinesias/tremors: mysoline   - 50mg  at 0700, 1200 and 50mg  QHS--seems to be tolerating   -valium: stopped, prn klonopin   -  [facility] to fit patient with adjustable hinged/locking  left knee brace today? 16. Breast cancer with hx of lumpectomy  -Appreciate Dr. Virgie Dad follow up. Arimidex to resume once discharged home   LOS (Days) 18 [place] T, MD 10/18/2016 9:11 AM

## 2016-10-18 NOTE — Progress Notes (Signed)
Speech Language Pathology Daily Session Note  Patient Details  Name: PORCHIA SINKLER MRN: [id number] Date of Birth: 1950-11-12  Today's Date: 10/18/2016 SLP Individual Time: 0900-1000 SLP Individual Time Calculation (min): 60 min  Short Term Goals: Week 3: SLP Short Term Goal 1 (Week 3): Pt will focus attention on task or speaker for 30 seconds with Max A multimodal cues.  SLP Short Term Goal 2 (Week 3): Given Max A multimodal cues, pt will increase speech intelligibility at the simple word level to ~25%. SLP Short Term Goal 3 (Week 3): Given Max A cues, pt with return demonstrate various blowing exercises to increase respiratory support for vocalizations.  SLP Short Term Goal 4 (Week 3): Given Max A multimodal cues and oral stimulation, pt will initiate swallow in 50% of trials.   Skilled Therapeutic Interventions: Skilled treatment session focused on speech communication goals. SLP facilitated session by providing Total A cues to promote voicing during sighs and vowels. SLP placed pt's hand on SLP's throat for sensory input of feeling SLP's vibration during voicing. Pt able to acknowledge sensation but unable to produce. Question possibility of motor impairments in isolated task such as voicing on demand. SLP placed pt's hands on SLP's shoulders to provide sensory feedback of deep breath in and exhalation. This technique appeared most successful. Pt able to produce ~ 25% voicing with vowels and with sighing. Pt able to name objects within sentence completion tasks with ~ 90% accuracy with articulatory movements only - no voicing heard for majority of task. Of note, pt with better focused on attention on task and better ability to shift attention to different tasks with ~ 5 seconds delay. Pt was left upright in wheelchair and with personal care attendant. Continue per current plan of care.      Function:    Cognition Comprehension Comprehension assist level: Understands basic less than 25% of the  time/ requires cueing >75% of the time  Expression Expression assistive device: Talk trach valve Expression assist level: Expresses basis less than 25% of the time/requires cueing >75% of the time.  Social Interaction Social Interaction assist level: Interacts appropriately less than 25% of the time. May be withdrawn or combative.  Problem Solving Problem solving assist level: Solves basic less than 25% of the time - needs direction nearly all the time or does not effectively solve problems and may need a restraint for safety  Memory Memory assist level: Recognizes or recalls 25 - 49% of the time/requires cueing 50 - 75% of the time;Recognizes or recalls less than 25% of the time/requires cueing greater than 75% of the time    Pain Pain Assessment Pain Assessment: No/denies pain  Therapy/Group: Individual Therapy  Michaelann Gunnoe 10/18/2016, 10:49 AM

## 2016-10-18 NOTE — Progress Notes (Signed)
RT in to administer CPT.  Pt unavailable with therapy.

## 2016-10-19 ENCOUNTER — Inpatient Hospital Stay (HOSPITAL_COMMUNITY): Payer: Medicare Other | Admitting: Physical Therapy

## 2016-10-19 ENCOUNTER — Inpatient Hospital Stay (HOSPITAL_COMMUNITY): Payer: Medicare Other | Admitting: Occupational Therapy

## 2016-10-19 ENCOUNTER — Ambulatory Visit: Payer: Medicare Other | Admitting: Adult Health

## 2016-10-19 LAB — GLUCOSE, CAPILLARY
Glucose-Capillary: 106 mg/dL — ABNORMAL HIGH (ref 65–99)
Glucose-Capillary: 132 mg/dL — ABNORMAL HIGH (ref 65–99)
Glucose-Capillary: 146 mg/dL — ABNORMAL HIGH (ref 65–99)

## 2016-10-19 LAB — BASIC METABOLIC PANEL
ANION GAP: 9 (ref 5–15)
BUN: 23 mg/dL — ABNORMAL HIGH (ref 6–20)
CHLORIDE: 98 mmol/L — AB (ref 101–111)
CO2: 31 mmol/L (ref 22–32)
Calcium: 9.6 mg/dL (ref 8.9–10.3)
Creatinine, Ser: 0.36 mg/dL — ABNORMAL LOW (ref 0.44–1.00)
GFR calc Af Amer: >60 mL/min (ref >60)
Glucose, Bld: 124 mg/dL — ABNORMAL HIGH (ref 65–99)
POTASSIUM: 4.1 mmol/L (ref 3.5–5.1)
SODIUM: 138 mmol/L (ref 135–145)

## 2016-10-19 LAB — CBC
HEMATOCRIT: 42.6 % (ref 36.0–46.0)
HEMOGLOBIN: 13.7 g/dL (ref 12.0–15.0)
MCH: 28 pg (ref 26.0–34.0)
MCHC: 32.2 g/dL (ref 30.0–36.0)
MCV: 87.1 fL (ref 78.0–100.0)
Platelets: 383 10*3/uL (ref 150–400)
RBC: 4.89 MIL/uL (ref 3.87–5.11)
RDW: 14.4 % (ref 11.5–15.5)
WBC: 7.4 10*3/uL (ref 4.0–10.5)

## 2016-10-19 MED ORDER — BETHANECHOL CHLORIDE 25 MG PO TABS
25.0000 mg | ORAL_TABLET | Freq: Three times a day (TID) | ORAL | Status: DC
  Administered 2016-10-19 – 2016-10-21 (×5): 25 mg
  Filled 2016-10-19 (×5): qty 1

## 2016-10-19 MED ORDER — GABAPENTIN 100 MG PO CAPS
100.0000 mg | ORAL_CAPSULE | Freq: Three times a day (TID) | ORAL | Status: DC
  Administered 2016-10-19 – 2016-10-22 (×8): 100 mg via ORAL
  Filled 2016-10-19 (×8): qty 1

## 2016-10-19 NOTE — Progress Notes (Signed)
Physical Therapy Session Note  Patient Details  Name: Judy Spears MRN: [id number] Date of Birth: 08-13-1950  Today's Date: 10/19/2016 PT Individual Time: 1505-1530 PT Individual Time Calculation (min): 25 min   Short Term Goals: Week 3:  PT Short Term Goal 1 (Week 3): =LTG due to length of stay   Skilled Therapeutic Interventions/Progress Updates: Pt presented in [place] chair with caregiver present. Session focus on sit to stand transfers. Pt transported to rehab gym and caregiver performed w/c to mat transfer. PTA in front of pt providing max A, performed sit to stand x 2 with PTA blocking B knees. PT able to maintain partial stand approx 30 sec with B feet flat on ground but L knee and hips flexed. SpO2 remained 97-100% with transfers on RA. PTA performed squat pivot transfer back to chair and pt transported back to room and repositioned in chair. Pt left in chair with caregiver present.      Therapy Documentation Precautions:  Precautions Precautions: Fall Precaution Comments: Trach, PEG Restrictions Weight Bearing Restrictions: No General:   Vital Signs: Oxygen Therapy SpO2: 98 % O2 Device: Not Delivered FiO2 (%): 21 %   See Function Navigator for Current Functional Status.   Therapy/Group: Individual Therapy  Misao Fackrell  Ellisyn Icenhower, PTA  10/19/2016, 4:30 PM

## 2016-10-19 NOTE — Plan of Care (Signed)
Problem: RH Pre-functional (Specify) Goal: RH LTG Pre-functional (Specify) Goal modified to the following: Pt will maintain static sitting balance on BSC with mod A when positioned with pillows for duration of toileting task. Napoleon Form, OTR/L

## 2016-10-19 NOTE — Progress Notes (Signed)
[place] PHYSICAL MEDICINE & REHABILITATION     PROGRESS NOTE    Subjective/Complaints: Had a quiet evening. No new apparent problems this morning. No diarrhea, abdominal bloating  ROS: limited by cognition  Objective: Vital Signs: Blood pressure (!) 142/84, pulse 81, temperature 99.1 F (37.3 C), temperature source Axillary, resp. rate (!) 22, height 5\' 2"  (1.575 m), weight 43.5 kg (96 lb), last menstrual period 12/01/2010, SpO2 100 %. No results found. No results for input(s): WBC, HGB, HCT, PLT in the last 72 hours. No results for input(s): NA, K, CL, GLUCOSE, BUN, CREATININE, CALCIUM in the last 72 hours.  Invalid input(s): CO CBG (last 3)   Recent Labs  10/18/16 2028 10/19/16 0008 10/19/16 0642  GLUCAP 87 132* 146*    Wt Readings from Last 3 Encounters:  10/18/16 43.5 kg (96 lb)  09/30/16 44.2 kg (97 lb 6.4 oz)  07/14/16 46.5 kg (102 lb 9.6 oz)    Physical Exam:  Constitutional:   Remains cachectic. No distress.  HENT:  Head: NCAT Mouth/Throat: Oropharynx is dry Eyes: PERRL Neck: trach in place. No secretions.  Cardiovascular:  RRR without murmur. No JVD    .    Respiratory: normal effort, no rhonchi  GI: Soft. Bowel sounds are normal. She exhibits no distension. There is no tenderness.   Musculoskeletal: fairly easily able to extend LLE to -25 degrees Neuro:   alert. Ongoing flexor tone in UE's with rhythmic movements /dyskinesias noted.[name] LLE flexor tone--- a little tighter today, only achieved -40 degrees. Can relax some on her own.  extensor tone RLE.  Is sensitive to ROM particularly on LLE, but RLE is sensitive too.    Skin: a few scattered abrasions. some yeast around vaginal/pelvic area Psych: flat  Assessment/Plan: 1. Functional and mobility deficits secondary to recent [place] encephalitis and subsequent pneumonia/bronchitis/respiratory sequelae which require 3+ hours per day of interdisciplinary therapy in a comprehensive inpatient rehab  setting. Physiatrist is providing close team supervision and 24 hour management of active medical problems listed below. Physiatrist and rehab team continue to assess barriers to discharge/monitor patient progress toward functional and medical goals.  Function:  Bathing Bathing position Bathing activity did not occur: Safety/medical concerns (total A bed level night bath) Position: Bed  Bathing parts   Body parts bathed by helper: Right arm, Left arm, Chest, Abdomen, Front perineal area, Buttocks, Right upper leg, Left upper leg, Right lower leg, Left lower leg, Back  Bathing assist Assist Level: 2 helpers      Upper Body Dressing/Undressing Upper body dressing   What is the patient wearing?: Hospital gown   Bra - Perfomed by helper: Thread/unthread right bra strap, Thread/unthread left bra strap, Hook/unhook bra (pull down sports bra)   Pull over shirt/dress - Perfomed by helper: Thread/unthread right sleeve, Thread/unthread left sleeve, Put head through opening, Pull shirt over trunk        Upper body assist Assist Level: 2 helpers      Lower Body Dressing/Undressing Lower body dressing   What is the patient wearing?: Hospital Gown       Pants- Performed by helper: Thread/unthread right pants leg, Pull pants up/down, Fasten/unfasten pants, Thread/unthread left pants leg       Socks - Performed by helper: Don/doff left sock, Don/doff right sock   Shoes - Performed by helper: Don/doff right shoe, Don/doff left shoe, Fasten right, Fasten left          Lower body assist Assist for lower body dressing: 2 Helpers  Toileting Toileting Toileting activity did not occur: No continent bowel/bladder event   Toileting steps completed by helper: Adjust clothing prior to toileting, Performs perineal hygiene, Adjust clothing after toileting    Toileting assist Assist level: Two helpers   Transfers Chair/bed transfer   Chair/bed transfer method: Squat pivot Chair/bed  transfer assist level: Total assist (Pt < 25%) Chair/bed transfer assistive device: Mechanical lift Mechanical lift: Stedy   Locomotion Ambulation Ambulation activity did not occur: Safety/medical Editor, commissioning activity did not occur: N/A Type: Manual (dependent in tilt in space chair )      Cognition Comprehension Comprehension assist level: Understands basic less than 25% of the time/ requires cueing >75% of the time  Expression Expression assist level: Expresses basis less than 25% of the time/requires cueing >75% of the time.  Social Interaction Social Interaction assist level: Interacts appropriately less than 25% of the time. May be withdrawn or combative.  Problem Solving Problem solving assist level: Solves basic less than 25% of the time - needs direction nearly all the time or does not effectively solve problems and may need a restraint for safety  Memory Memory assist level: Recognizes or recalls 25 - 49% of the time/requires cueing 50 - 75% of the time, Recognizes or recalls less than 25% of the time/requires cueing greater than 75% of the time   Medical Problem List and Plan: 1. 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 DVT Prophylaxis/Anticoagulation: Pharmaceutical: Lovenox 3. Pain Management: legs are hypersensitive to touch which is something I noticed in the office with her last month too. Often inhibits activity    -continue low dose gabapentin and observe for response   4. Mood: LCSW to follow for evaluation and support.  5. Neuropsych: This patient is notcapable of making decisions on herown behalf. 6. Skin/Wound Care: Air mattress overlay. Routine pressure relief measures.  -appreciate WOC RN recs  -given diflucan for yeast  in vaginal/groin area 7. Fluids/Electrolytes/Nutrition:      -osmolite 1.5 at 85cc/hr currently, prostat BID as well  -prealbumin up to 23.1 most recently  -weight trending up. Value pending today  -team working with family to ensure appropriate communication regarding diet/weights  Filed Weights   10/16/16 0600 10/17/16 0314 10/18/16 0530  Weight: 37.8 kg (83 lb 5.3 oz) 19.5 kg (43 lb) 43.5 kg (96 lb)     -RD follow up 8. MRSA tracheobronchitis: completed abx for this -  9. Hyponatremia: recurrent but improving again  -at 135 9/21  -continue free water  100 mL 3 times a day sodium continues to improve  -recheck Tuesday      10. Urinary retention: PVR's 1300-400 cc,  -I and O cath prn (continues to require)  - urecholine trial   -urine culture positive for pseudomonas--changed to cipro on 9/22---continue for 7 day course (probiotic added)   11. HTN:  Trending up slightly. Increased to norvasc 10mg  daily as of today  12. H/o constipation:   continue current regimen for now.  13. VDRF: Chest PT bid. ATC for humidification. Suction prn. HOB elevated, aspiration precautions  14. Hypokalemia: Improved, potassium normal 15. Dyskinesias/tremors: mysoline   - 50mg  at 0700, 1200 and 50mg  QHS--seems to be tolerating   -valium: stopped, prn klonopin   -  [facility] to fit patient with adjustable hinged/locking left knee brace today? 16. Breast cancer with hx of lumpectomy  -Appreciate Dr. Virgie Dad follow up. Arimidex to resume once discharged home   LOS (Days) [place] T, MD 10/19/2016 8:51 AM

## 2016-10-19 NOTE — Progress Notes (Signed)
Occupational Therapy Session Note  Patient Details  Name: Judy Spears MRN: [id number] Date of Birth: [birth year]  Today's Date: 10/19/2016 OT Individual Time: 1000-1100 OT Individual Time Calculation (min): 60 min    Short Term Goals: Week 3:  OT Short Term Goal 1 (Week 3): STG=LTG due to LOS  Skilled Therapeutic Interventions/Progress Updates:    Pt seen for OT session focusing on functional sitting balance and positioning with emphasis on tone reduction and midline orientation. Pt sitting up in w/c upon arrival with husband and personal caregiver present. Throughout session, completed squat pivot transfers with max- total A. Seated EOM, used wedge and physioballs to facilitate positioning of pt's pelvic and trunk in midline position. Tactile and manual facilitation provided by therapist. When positioned, pt able to maintain static midline orientation sitting with close supervision and multimodal cuing for anterior weight shift. Completed stretches EOM to facilitate thoracic and diaphragmatic extension for breathing and positioning. Pt returned to w/c and left seated in gym with hand off to SLP. Pt's husband with questions regarding d/c date. Provided education and reasoning of appropriateness of targeted d/c date,however, caregiver still wanting to discuss with MD and CSW. Also discussed weaning of O2 and increased activity tolerance/ endurance.   Therapy Documentation Precautions:  Precautions Precautions: Fall Precaution Comments: Trach, PEG Restrictions Weight Bearing Restrictions: No Pain: Pain Assessment Pain Assessment: Faces Faces Pain Scale: No hurt  See Function Navigator for Current Functional Status.   Therapy/Group: Individual Therapy  [name], Ziggy Reveles C 10/19/2016, 7:04 AM

## 2016-10-19 NOTE — Plan of Care (Signed)
Problem: RH Bed to Chair Transfers Goal: LTG Patient will perform bed/chair transfers w/assist (PT) LTG: Patient will perform bed/chair transfers with assistance, with/without cues (PT).  Transfer method changed due to caregiver ability to perform

## 2016-10-19 NOTE — Progress Notes (Signed)
Physical Therapy Session Note  Patient Details  Name: Judy Spears MRN: [id number] Date of Birth: 1950/07/09  Today's Date: 10/19/2016 PT Individual Time: 1300-1400 PT Individual Time Calculation (min): 60 min   Short Term Goals: Week 3:  PT Short Term Goal 1 (Week 3): =LTG due to length of stay   Skilled Therapeutic Interventions/Progress Updates:   Pt received sitting up in wheelchair, agreeable to therapy. Pt squat pivot transfer to Nustep total assist + 2 for guiding hips. Pt able to perform small reciprocal movements on Nustep with max support to keep trunk in midline and support to prevent LLE from external rotation. Pt sit<>stand x 4 with UE support on parallel bar and mod assist + 2. Pt unable to reach upright posture. Sit to stand max assist + 3 with bilat support under UE and at knee to reach better upright position. Pt left siting up in wheelchair, all needs in reach. Caregiver present.     Therapy Documentation Precautions:  Precautions Precautions: Fall Precaution Comments: Trach, PEG Restrictions Weight Bearing Restrictions: No   See Function Navigator for Current Functional Status.   Therapy/Group: Individual Therapy  Gwinda Passe, SPT 10/19/2016, 3:31 PM

## 2016-10-19 NOTE — Progress Notes (Signed)
Speech Language Pathology Daily Session Note  Patient Details  Name: Judy Spears MRN: [id number] Date of Birth: 30-Nov-1950  Today's Date: 10/19/2016 SLP Individual Time: 1100-1200 SLP Individual Time Calculation (min): 60 min  Short Term Goals: Week 3: SLP Short Term Goal 1 (Week 3): Pt will focus attention on task or speaker for 30 seconds with Max A multimodal cues.  SLP Short Term Goal 2 (Week 3): Given Max A multimodal cues, pt will increase speech intelligibility at the simple word level to ~25%. SLP Short Term Goal 3 (Week 3): Given Max A cues, pt with return demonstrate various blowing exercises to increase respiratory support for vocalizations.  SLP Short Term Goal 4 (Week 3): Given Max A multimodal cues and oral stimulation, pt will initiate swallow in 50% of trials.   Skilled Therapeutic Interventions: Skilled treatment session focused on speech goals. SLP facilitated session by providing Max A multimodal cues for voicing at the single phoneme level and during automatic sequences. Patient able to achieve voicing in 10% of opportunities despite positional changes, verbal cues and tactile cues. Patient had PMSV on throughout session with all vitals remaining WFL without supplemental O2. RN aware. Patient left upright in wheelchair with family present. Continue with current plan of care.      Function:   Cognition Comprehension Comprehension assist level: Understands basic less than 25% of the time/ requires cueing >75% of the time  Expression Expression assistive device: Talk trach valve Expression assist level: Expresses basis less than 25% of the time/requires cueing >75% of the time.  Social Interaction Social Interaction assist level: Interacts appropriately less than 25% of the time. May be withdrawn or combative.  Problem Solving Problem solving assist level: Solves basic less than 25% of the time - needs direction nearly all the time or does not effectively solve problems and  may need a restraint for safety  Memory Memory assist level: Recognizes or recalls 25 - 49% of the time/requires cueing 50 - 75% of the time;Recognizes or recalls less than 25% of the time/requires cueing greater than 75% of the time    Pain Pain Assessment Pain Assessment: Faces Faces Pain Scale: No hurt  Therapy/Group: Individual Therapy  Orrie Lascano 10/19/2016, 12:37 PM

## 2016-10-20 ENCOUNTER — Inpatient Hospital Stay (HOSPITAL_COMMUNITY): Payer: Medicare Other

## 2016-10-20 ENCOUNTER — Inpatient Hospital Stay (HOSPITAL_COMMUNITY): Payer: Medicare Other | Admitting: Speech Pathology

## 2016-10-20 ENCOUNTER — Inpatient Hospital Stay (HOSPITAL_COMMUNITY): Payer: Medicare Other | Admitting: Occupational Therapy

## 2016-10-20 ENCOUNTER — Inpatient Hospital Stay (HOSPITAL_COMMUNITY): Payer: Medicare Other | Admitting: Physical Therapy

## 2016-10-20 LAB — GLUCOSE, CAPILLARY
GLUCOSE-CAPILLARY: 147 mg/dL — AB (ref 65–99)
Glucose-Capillary: 107 mg/dL — ABNORMAL HIGH (ref 65–99)
Glucose-Capillary: 124 mg/dL — ABNORMAL HIGH (ref 65–99)
Glucose-Capillary: 144 mg/dL — ABNORMAL HIGH (ref 65–99)
Glucose-Capillary: 145 mg/dL — ABNORMAL HIGH (ref 65–99)

## 2016-10-20 MED ORDER — FREE WATER
200.0000 mL | Freq: Three times a day (TID) | Status: DC
  Administered 2016-10-20 – 2016-10-27 (×28): 200 mL

## 2016-10-20 NOTE — Progress Notes (Signed)
Physical Therapy Note  Patient Details  Name: Judy Spears MRN: [id number] Date of Birth: Feb 24, 1950 Today's Date: 10/20/2016  1135-1200, 25 min individual tx Pain: none  W/c> personal Stedy with total assist.  Sit>< partial stand x 4.  Pt required total assist but did initiate forward wt shift.  L foot stayed in contact with foot plate 2/4 stands. Focus on upright trunk, midline orientation in high sitting and standing in [place].  Caregiver [name] present, and pt passed to Respiratory Therapists.   See function navigator for current status.  [name] 10/20/2016, 9:29 AM

## 2016-10-20 NOTE — Progress Notes (Signed)
Social Work Patient ID: Judy Spears, female   DOB: 1950/03/05, 66 y.o.   MRN: [id number]  Team conference reviewed with spouse this morning and Dr. [name] speaking with him about medical adjustments being made.  Spouse pleased with update and with change in d/c date to 10/3.  Will continue to follow.  [name], LCSW

## 2016-10-20 NOTE — Progress Notes (Addendum)
[facility] PHYSICAL MEDICINE & REHABILITATION     PROGRESS NOTE    Subjective/Complaints: No problems over night. No diarrhea. Awoke easily today. Appears comfortable  ROS: Limited due to cognitive/behavioral   Objective: Vital Signs: Blood pressure (!) 154/89, pulse 88, temperature 98 F (36.7 C), temperature source Axillary, resp. rate 18, height 5\' 2"  (1.575 m), weight 40.2 kg (88 lb 11.2 oz), last menstrual period 12/01/2010, SpO2 95 %. No results found.  Recent Labs  10/19/16 1524  WBC 7.4  HGB 13.7  HCT 42.6  PLT 383    Recent Labs  10/19/16 1524  NA 138  K 4.1  CL 98*  GLUCOSE 124*  BUN 23*  CREATININE 0.36*  CALCIUM 9.6   CBG (last 3)   Recent Labs  10/19/16 1215 10/20/16 0011 10/20/16 0611  GLUCAP 106* 145* 144*    Wt Readings from Last 3 Encounters:  10/19/16 40.2 kg (88 lb 11.2 oz)  09/30/16 44.2 kg (97 lb 6.4 oz)  07/14/16 46.5 kg (102 lb 9.6 oz)    Physical Exam:  Constitutional:   Remains cachectic. No distress.  HENT:  Head: NCAT Mouth/Throat: Oropharynx is dry Eyes: PERRL Neck: trach in place. No secretions.  Cardiovascular:  RRR without murmur. No JVD    .    Respiratory: normal effort, no rhonchi  GI: Soft. Bowel sounds are normal. She exhibits no distension. There is no tenderness.   Musculoskeletal: fairly easily able to extend LLE to -25 degrees Neuro:   alert. Ongoing flexor tone in UE's with rhythmic movements /dyskinesias noted.[name] LLE flexor tone--- a little tighter today, only achieved -40 degrees. Can relax some on her own.  extensor tone RLE.  Is sensitive to ROM particularly on LLE, but RLE is sensitive too.    Skin: a few scattered abrasions. some yeast around vaginal/pelvic area Psych: flat  Assessment/Plan: 1. Functional and mobility deficits secondary to recent [place] encephalitis and subsequent pneumonia/bronchitis/respiratory sequelae which require 3+ hours per day of interdisciplinary therapy in a  comprehensive inpatient rehab setting. Physiatrist is providing close team supervision and 24 hour management of active medical problems listed below. Physiatrist and rehab team continue to assess barriers to discharge/monitor patient progress toward functional and medical goals.  Function:  Bathing Bathing position Bathing activity did not occur: Safety/medical concerns (total A bed level night bath) Position: Bed  Bathing parts   Body parts bathed by helper: Right arm, Left arm, Chest, Abdomen, Front perineal area, Buttocks, Right upper leg, Left upper leg, Right lower leg, Left lower leg, Back  Bathing assist Assist Level: 2 helpers      Upper Body Dressing/Undressing Upper body dressing   What is the patient wearing?: Hospital gown   Bra - Perfomed by helper: Thread/unthread right bra strap, Thread/unthread left bra strap, Hook/unhook bra (pull down sports bra)   Pull over shirt/dress - Perfomed by helper: Thread/unthread right sleeve, Thread/unthread left sleeve, Put head through opening, Pull shirt over trunk        Upper body assist Assist Level: 2 helpers      Lower Body Dressing/Undressing Lower body dressing   What is the patient wearing?: Hospital Gown       Pants- Performed by helper: Thread/unthread right pants leg, Pull pants up/down, Fasten/unfasten pants, Thread/unthread left pants leg       Socks - Performed by helper: Don/doff left sock, Don/doff right sock   Shoes - Performed by helper: Don/doff right shoe, Don/doff left shoe, Fasten right, Fasten left  Lower body assist Assist for lower body dressing: 2 Helpers      Toileting Toileting Toileting activity did not occur: No continent bowel/bladder event   Toileting steps completed by helper: Adjust clothing prior to toileting, Performs perineal hygiene, Adjust clothing after toileting    Toileting assist Assist level: Two helpers   Transfers Chair/bed transfer   Chair/bed transfer  method: Squat pivot Chair/bed transfer assist level: Total assist (Pt < 25%) Chair/bed transfer assistive device: Mechanical lift Mechanical lift: Stedy   Locomotion Ambulation Ambulation activity did not occur: Safety/medical Editor, commissioning activity did not occur: N/A Type: Manual (dependent in tilt in space chair )      Cognition Comprehension Comprehension assist level: Understands basic less than 25% of the time/ requires cueing >75% of the time  Expression Expression assist level: Expresses basis less than 25% of the time/requires cueing >75% of the time.  Social Interaction Social Interaction assist level: Interacts appropriately less than 25% of the time. May be withdrawn or combative.  Problem Solving Problem solving assist level: Solves basic less than 25% of the time - needs direction nearly all the time or does not effectively solve problems and may need a restraint for safety  Memory Memory assist level: Recognizes or recalls 25 - 49% of the time/requires cueing 50 - 75% of the time, Recognizes or recalls less than 25% of the time/requires cueing greater than 75% of the time   Medical Problem List and Plan: 1. 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 DVT Prophylaxis/Anticoagulation: Pharmaceutical: Lovenox 3. Pain Management: legs are hypersensitive to touch which is something I noticed in the office with her last month too. Often inhibits activity    -continue low dose gabapentin, adjusted to 100mg  TID  4. Mood: LCSW to follow for evaluation and support.  5. Neuropsych: This patient is notcapable of making decisions on herown behalf. 6. Skin/Wound Care: Air mattress overlay. Routine pressure relief measures.  -appreciate WOC RN recs  -given  diflucan for yeast in vaginal/groin area 7. Fluids/Electrolytes/Nutrition:      -osmolite 1.5 at 85cc/hr currently, prostat BID as well  -prealbumin up to 23.1 most recently  -weights are trending up. Values are inconsistent.   -team working with family to ensure appropriate communication regarding diet/weights  Filed Weights   10/17/16 0314 10/18/16 0530 10/19/16 1030  Weight: 19.5 kg (43 lb) 43.5 kg (96 lb) 40.2 kg (88 lb 11.2 oz)     -RD follow up  -recheck bmet and prealbumin wednesday 8. MRSA tracheobronchitis: completed abx for this -  9. Hyponatremia:  improving  -at 138 9/25  -titrate free water  To 200 mL 3 times a day as she's getting a little dry      10. Urinary retention: PVR's trending down into 100's yesterday,  -I and O cath prn    - urecholine increased to 25mg  on 9/25  -urine culture positive for pseudomonas--changed to cipro on 9/22---continue for 7 day course (probiotic added)   11. HTN:  Trending up slightly. Increased to norvasc 10mg  daily as of today  12. H/o constipation:   continue current regimen for now.  13. VDRF: Chest PT bid. ATC for humidification. Suction prn. HOB elevated, aspiration precautions  14. Hypokalemia: Improved, potassium normal 15.  Dyskinesias/tremors: mysoline   - 50mg  at 0700, 1200 and 50mg  QHS--seems to be tolerating   -valium: stopped, prn klonopin   -  Hanger fit patient with adjustable hinged/locking left knee brace    -wearing/tolerating so far.  16. Breast cancer with hx of lumpectomy  -Appreciate Dr. Virgie Dad follow up. Arimidex to resume once discharged home   LOS (Days) [place] T, MD 10/20/2016 9:05 AM

## 2016-10-20 NOTE — Patient Care Conference (Signed)
Inpatient RehabilitationTeam Conference and Plan of Care Update Date: 10/19/2016   Time: 2:05 PM    Patient Name: Judy Spears      Medical Record Number: [id number]  Date of Birth: Mar 09, 1950 Sex: Female         Room/Bed: 4W08C/4W08C-01 Payor Info: Payor: [employer] MEDICARE / Plan: [facility] MEDICARE / Product Type: *No Product type* /    Admitting Diagnosis: fever constipation  Admit Date/Time:  09/30/2016  5:29 PM Admission Comments: No comment available   Primary Diagnosis:  Spastic tetraplegia ([place]) Principal Problem: Spastic tetraplegia ([place])  Patient Active Problem List   Diagnosis Date Noted  . Severe muscle deconditioning 09/30/2016  . Pressure injury of skin 09/28/2016  . Respiratory failure ([place])   . Tracheobronchitis   . Urinary retention   . PEG (percutaneous endoscopic gastrostomy) status ([place])   . Benign essential HTN   . Hyponatremia   . Hypokalemia   . Reactive hypertension   . Respiratory insufficiency 09/24/2016  . Malignant neoplasm of upper-inner quadrant of left breast in female, estrogen receptor positive ([place]) 07/14/2016  . Heterotopic ossification of bone 03/10/2016  . Acute on chronic respiratory failure with hypoxia ([place])   . Acute hypoxemic respiratory failure ([place])   . Acute blood loss anemia   . Atelectasis 02/04/2016  . Debility 02/03/2016  . Pneumonia of both lower lobes due to Pseudomonas species ([place])   . Pneumonia of both lower lobes due to methicillin susceptible Staphylococcus aureus (MSSA) ([place])   . Acute respiratory failure with hypoxia ([place])   . Acute encephalopathy   . Seizures ([place])   . Sepsis ([place])   . Hypoxia   . Pain   . HCAP (healthcare-associated pneumonia) 12/28/2015  . Chronic respiratory failure ([place]) 12/28/2015  . Acute tracheobronchitis 12/24/2015  . Tracheostomy tube present ([place])   . Tracheal stenosis   . Chronic respiratory failure with hypoxia ([place])   . Spastic tetraplegia ([place]) 10/20/2015  . Tracheostomy status  ([place]) 09/26/2015  . Allergic rhinitis 09/26/2015  . 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 Movement disorder 09/22/2015  . Encephalitis 09/22/2015  . Chest pain 02/07/2014  . Premature atrial contractions 01/13/2014  . Abnormality of gait 12/04/2013  . Hyperlipidemia 12/21/2011  . Cardiovascular risk factor 12/21/2011  . Bunion 01/31/2008  . Metatarsalgia of both feet 09/20/2007  . FLAT FOOT 09/20/2007  . HALLUX RIGIDUS, ACQUIRED 09/20/2007    Expected Discharge Date: Expected Discharge Date: 10/27/16  Team Members Present: Physician leading conference: Dr. Alger Simons Social Worker Present: Lennart Pall, LCSW Nurse Present: Leonette Nutting, RN PT Present: Kem Parkinson, PT OT Present: Willeen Cass, OT SLP Present: Weston Anna, SLP PPS Coordinator present : Daiva Nakayama, RN, CRRN     Current Status/Progress Goal Weekly Team Focus  Medical   persistent motor issues. nutritional state improving with aggressive feeding  improve activity tolerance  nutrition, skin care, rx of UTI, titration of medication   Bowel/Bladder   incontinenet of bowel at times, LBM 10/18/16, in & out cath   total assist  continue to scan Q5-7 hrs & cath volume >350   Swallow/Nutrition/ Hydration   No swallow initiation despite Total A cues  Max A for swallow iniitation with thermal stimulation  swallow initiation   ADL's   Max- total A overall, can require + 2 when fatigued; +2 total A toileting sit<>stand  Max- total A +1  Caregiver training, activity tolerance, sitting balance, neuro re-ed, functional transfers   Mobility   Pt total/max assist  for transfers, total assist + 2 bed moblity  max assist + 1 transfers and bed mobility   Transfers, LE strengthening & ROM, bed mobilty    Communication   PMSV with full supervision, Total A  - for voicing - no communicative ability with verbal or gestural  Max A   speech intelligibility at the word level   Safety/Cognition/ Behavioral  Observations  Total A  Max A   arousal, attention   Pain   no signs of pain, has tylenol prn & baclofen, neurontin scheduled  no signs of pain  continue to assess & treat as needed   Skin   mouth pink & slightly dry, mouth breather, slightly reddedned buttocks, MASD to buttocks & groin, microgard & lotrimin cream, vaginal area slightly swollen, skin tear left elbow  no new areas of skin break down  continue treatments as ordered, assess q shift    Rehab Goals Patient on target to meet rehab goals: Yes *See Care Plan and progress notes for long and short-term goals.     Barriers to Discharge  Current Status/Progress Possible Resolutions Date Resolved   Physician    Medical stability        continued nutrition, abx, adjustment of medications for movement issues.       Nursing  Neurogenic Bowel & Bladder;Incontinence;Trach;Medical stability               PT                    OT                  SLP                SW                Discharge Planning/Teaching Needs:  To return home with famiy and private duty caregiver resuming 24/7 assistance.  Teaching ongoing   Team Discussion:  MD insists must keep accurate weight checks.  Tolerated off O2 for a session today.  New brace received today.  Multiple motor-movement issues.  Nutritionally improving overall.  May need to teach I/O caths.  Team agrees an extension of a few more days could be beneficial.  Revisions to Treatment Plan:  None    Continued Need for Acute Rehabilitation Level of Care: The patient requires daily medical management by a physician with specialized training in physical medicine and rehabilitation for the following conditions: Daily direction of a multidisciplinary physical rehabilitation program to ensure safe treatment while eliciting the highest outcome that is of practical value to the patient.: Yes Daily medical management of patient stability for increased activity during participation in an intensive  rehabilitation regime.: Yes Daily analysis of laboratory values and/or radiology reports with any subsequent need for medication adjustment of medical intervention for : Neurological problems;Nutritional problems  Rana Adorno 10/20/2016, 2:02 PM

## 2016-10-20 NOTE — Progress Notes (Signed)
Occupational Therapy Session Note  Patient Details  Name: Judy Spears MRN: [id number] Date of Birth: Oct 05, 1950  Today's Date: 10/20/2016 OT Individual Time: 1300-1430 OT Individual Time Calculation (min): 90 min    Short Term Goals: Week 3:  OT Short Term Goal 1 (Week 3): STG=LTG due to LOS  Skilled Therapeutic Interventions/Progress Updates:    Pt seen for OT session focusing on ADL re-training with functional standing tolerance and neuro re-ed. OT clinical specialist co-tx along with PT present majority of session. Pt on RA throughout session, O2 >98% throughout session and with activity. HR remained ~85 BPM throughout session. Pt sitting up in tilt-in-space w/c upon arrival, personal care attendant present. PT's brief noted to be soiled. Completed modified sit>stand at standard chair with max A +1 to stand and second person assisted with hygiene and clothing management. Pt tolerated x2 standing trials, ~1 minute each trial.  In therapy gym, squat pivots completed with max A +1. Placed pt in modified prone position with trunk supported with wedged pillows. Due to pt's L LE flexor tone unable to achieve full prone position. Although VS remained stable, pt wimpering and nodding desire to change position. Modified position to L side-lying with emphasis on pt relaxation in order to decrease tone. Pt with moderately productive cough following change in position and assist provided to clear secretions. When returned to supine pt noted to have severe nystagmus and extended rest break in supine provided until occular alignment returned to WNL.  With +3 assist, transferred pt onto stability ball. Completed assisted hip mobility exercises with emphasis on pt's ability to disassociate hip/trunk movements.  Completed anterior/posterior and R/L. Pt returned to w/c at end of session, left in care of personal care attendant and pt's daughter.   Therapy Documentation Precautions:  Precautions Precautions:  Fall Precaution Comments: Trach, PEG Restrictions Weight Bearing Restrictions: No Pain:   No/ denies pain  See Function Navigator for Current Functional Status.   Therapy/Group: Individual Therapy  [name], Rockney Grenz C 10/20/2016, 7:16 AM

## 2016-10-20 NOTE — Progress Notes (Signed)
Physical Therapy Session Note  Patient Details  Name: MALANEY MCBEAN MRN: [id number] Date of Birth: 05-16-1950  Today's Date: 10/20/2016 PT Individual Time: 1000-1100 PT Individual Time Calculation (min): 60 min   Short Term Goals: Week 3:  PT Short Term Goal 1 (Week 3): =LTG due to length of stay   Skilled Therapeutic Interventions/Progress Updates:   Pt received handoff from SLP, agreeable to therapy. Pt squat pivot transfer from chair to mat with total assist. Total assist + 2 for sit<>supine to manage tone. Pt 1 set, 10 reps of glut bridges. Pt max/mod assist + 2 with sit>stand with support under UE. Pt able able to maintain stand ~2-3 seconds x 3 with reduced support before slow lower back to mat. Pt practice sit<>stand using steady. Max assist to stand but pt able to maintain stance ~1 min x 2 holding on to steady with both UE and supervision. Max verbal and tactile cues. PROM to L hamstring and performed STM to upper traps to facilitate relaxation and tolerance of stretch. Pt husband and caregiver present for session. Pt left sitting up in chair, all needs in reach.   Therapy Documentation Precautions:  Precautions Precautions: Fall Precaution Comments: Trach, PEG Restrictions Weight Bearing Restrictions: No Vital Signs: O2 WNL throughout session on room air   See Function Navigator for Current Functional Status.   Therapy/Group: Individual Therapy  Gwinda Passe, SPT 10/20/2016, 11:13 AM

## 2016-10-20 NOTE — Progress Notes (Signed)
Discussed multiple questions regarding tube feed/water flushes, UTI and antibiotic regimen as well as bladder issues with daughter and [name] (caregiver).  Relayed that water flushes are being done qid and that medication regimen will be reviewed in detail at discharge.

## 2016-10-20 NOTE — Progress Notes (Signed)
Speech Language Pathology Daily Session Note  Patient Details  Name: Judy Spears MRN: [id number] Date of Birth: March 14, 1950  Today's Date: 10/20/2016 SLP Individual Time: 0930-1000 SLP Individual Time Calculation (min): 30 min  Short Term Goals: Week 3: SLP Short Term Goal 1 (Week 3): Pt will focus attention on task or speaker for 30 seconds with Max A multimodal cues.  SLP Short Term Goal 2 (Week 3): Given Max A multimodal cues, pt will increase speech intelligibility at the simple word level to ~25%. SLP Short Term Goal 3 (Week 3): Given Max A cues, pt with return demonstrate various blowing exercises to increase respiratory support for vocalizations.  SLP Short Term Goal 4 (Week 3): Given Max A multimodal cues and oral stimulation, pt will initiate swallow in 50% of trials.   Skilled Therapeutic Interventions: Skilled treatment session focused on speech goals. Upon arrival, patient was upright in wheelchair and appeared lethargic. However, patient with constant vocalizations but unable to determine if due to pain. Patient able to voice while counting 1-5 X 2 and named functional items with 100% accuracy and Mod A verbal cues for use of an increased vocal intensity. In middle of session, patient became exteremly lethargic and unable to keep her eyes open despite Max A multimodal cues. Patient's PMSV was in place throughout sessions with all vitals remaining WFL in room air. Patient handed off to PT. Continue with current plan of care.      Function:   Cognition Comprehension Comprehension assist level: Understands basic less than 25% of the time/ requires cueing >75% of the time  Expression Expression assistive device: Talk trach valve Expression assist level: Expresses basis less than 25% of the time/requires cueing >75% of the time.  Social Interaction Social Interaction assist level: Interacts appropriately less than 25% of the time. May be withdrawn or combative.  Problem Solving  Problem solving assist level: Solves basic less than 25% of the time - needs direction nearly all the time or does not effectively solve problems and may need a restraint for safety  Memory Memory assist level: Recognizes or recalls 25 - 49% of the time/requires cueing 50 - 75% of the time;Recognizes or recalls less than 25% of the time/requires cueing greater than 75% of the time    Pain No/Denies Pain   Therapy/Group: Individual Therapy  [name] Blakeman 10/20/2016, 11:12 AM

## 2016-10-21 ENCOUNTER — Inpatient Hospital Stay (HOSPITAL_COMMUNITY): Payer: Medicare Other | Admitting: Speech Pathology

## 2016-10-21 ENCOUNTER — Inpatient Hospital Stay (HOSPITAL_COMMUNITY): Payer: Medicare Other | Admitting: Physical Therapy

## 2016-10-21 ENCOUNTER — Inpatient Hospital Stay (HOSPITAL_COMMUNITY): Payer: Medicare Other | Admitting: Occupational Therapy

## 2016-10-21 LAB — GLUCOSE, CAPILLARY
GLUCOSE-CAPILLARY: 106 mg/dL — AB (ref 65–99)
GLUCOSE-CAPILLARY: 120 mg/dL — AB (ref 65–99)
GLUCOSE-CAPILLARY: 121 mg/dL — AB (ref 65–99)
Glucose-Capillary: 112 mg/dL — ABNORMAL HIGH (ref 65–99)
Glucose-Capillary: 120 mg/dL — ABNORMAL HIGH (ref 65–99)

## 2016-10-21 LAB — BASIC METABOLIC PANEL
Anion gap: 9 (ref 5–15)
BUN: 20 mg/dL (ref 6–20)
CHLORIDE: 97 mmol/L — AB (ref 101–111)
CO2: 32 mmol/L (ref 22–32)
CREATININE: 0.35 mg/dL — AB (ref 0.44–1.00)
Calcium: 9.1 mg/dL (ref 8.9–10.3)
GFR calc Af Amer: >60 mL/min (ref >60)
GFR calc non Af Amer: >60 mL/min (ref >60)
GLUCOSE: 110 mg/dL — AB (ref 65–99)
Potassium: 3.7 mmol/L (ref 3.5–5.1)
Sodium: 138 mmol/L (ref 135–145)

## 2016-10-21 LAB — PREALBUMIN: Prealbumin: 28.6 mg/dL (ref 18–38)

## 2016-10-21 MED ORDER — PRIMIDONE 50 MG PO TABS
50.0000 mg | ORAL_TABLET | Freq: Three times a day (TID) | ORAL | Status: DC
  Administered 2016-10-21 – 2016-10-27 (×18): 50 mg
  Filled 2016-10-21 (×22): qty 1

## 2016-10-21 MED ORDER — BETHANECHOL CHLORIDE 10 MG PO TABS
10.0000 mg | ORAL_TABLET | Freq: Three times a day (TID) | ORAL | Status: DC
  Administered 2016-10-21 – 2016-10-24 (×9): 10 mg
  Filled 2016-10-21 (×9): qty 1

## 2016-10-21 NOTE — Progress Notes (Signed)
Attempted at 2 different times to obtain sputum sample. No secretions. Specimen cup at bedside. RN made aware.

## 2016-10-21 NOTE — Progress Notes (Addendum)
[place] PHYSICAL MEDICINE & REHABILITATION     PROGRESS NOTE    Subjective/Complaints:  Issues with TF being off briefly last night. Otherwise unremarkable evening. Tolerated KI  ROS: limited   Objective: Vital Signs: Blood pressure 125/72, pulse 86, temperature 97.8 F (36.6 C), temperature source Axillary, resp. rate 20, height 5\' 2"  (1.575 m), weight 40.2 kg (88 lb 10 oz), last menstrual period 12/01/2010, SpO2 99 %. No results found.  Recent Labs  10/19/16 1524  WBC 7.4  HGB 13.7  HCT 42.6  PLT 383    Recent Labs  10/19/16 1524 10/21/16 0716  NA 138 138  K 4.1 3.7  CL 98* 97*  GLUCOSE 124* 110*  BUN 23* 20  CREATININE 0.36* 0.35*  CALCIUM 9.6 9.1   CBG (last 3)   Recent Labs  10/20/16 1735 10/21/16 0004 10/21/16 0608  GLUCAP 124* 120* 120*    Wt Readings from Last 3 Encounters:  10/21/16 40.2 kg (88 lb 10 oz)  09/30/16 44.2 kg (97 lb 6.4 oz)  07/14/16 46.5 kg (102 lb 9.6 oz)    Physical Exam:  Constitutional:   Remains cachectic. No distress.  HENT:  Head: NCAT Mouth/Throat: Oropharynx is dry Eyes: PERRL Neck: trach without secretions.  Cardiovascular:  RRR without murmur. No JVD     .    Respiratory: CTA Bilaterally without wheezes or rales. Normal effort   GI: Soft. Bowel sounds are normal. She exhibits no distension. There is no tenderness.   Musculoskeletal: fairly easily able to extend LLE to -25 degrees Neuro:   alert. Ongoing flexor tone in UE's with rhythmic movements /dyskinesias noted.[name] LLE flexor tone--- in brace, moved nicely with modest PROM to near full extension today. Can relax some on her own.  extensor tone RLE.  Is sensitive to ROM particularly on LLE, but RLE is sensitive too.    Skin: a few scattered abrasions. some yeast around vaginal/pelvic area Psych: flat  Assessment/Plan: 1. Functional and mobility deficits secondary to recent [place] encephalitis and subsequent pneumonia/bronchitis/respiratory sequelae  which require 3+ hours per day of interdisciplinary therapy in a comprehensive inpatient rehab setting. Physiatrist is providing close team supervision and 24 hour management of active medical problems listed below. Physiatrist and rehab team continue to assess barriers to discharge/monitor patient progress toward functional and medical goals.  Function:  Bathing Bathing position Bathing activity did not occur: Safety/medical concerns (total A bed level night bath) Position: Bed  Bathing parts   Body parts bathed by helper: Right arm, Left arm, Chest, Abdomen, Front perineal area, Buttocks, Right upper leg, Left upper leg, Right lower leg, Left lower leg, Back  Bathing assist Assist Level: 2 helpers      Upper Body Dressing/Undressing Upper body dressing   What is the patient wearing?: Hospital gown   Bra - Perfomed by helper: Thread/unthread right bra strap, Thread/unthread left bra strap, Hook/unhook bra (pull down sports bra)   Pull over shirt/dress - Perfomed by helper: Thread/unthread right sleeve, Thread/unthread left sleeve, Put head through opening, Pull shirt over trunk        Upper body assist Assist Level: 2 helpers      Lower Body Dressing/Undressing Lower body dressing   What is the patient wearing?: Hospital Gown       Pants- Performed by helper: Thread/unthread right pants leg, Pull pants up/down, Fasten/unfasten pants, Thread/unthread left pants leg       Socks - Performed by helper: Don/doff left sock, Don/doff right sock  Shoes - Performed by helper: Don/doff right shoe, Don/doff left shoe, Fasten right, Fasten left          Lower body assist Assist for lower body dressing: 2 Helpers      Naval architect activity did not occur: No continent bowel/bladder event   Toileting steps completed by helper: Adjust clothing prior to toileting, Performs perineal hygiene, Adjust clothing after toileting    Toileting assist Assist level: Two  helpers   Transfers Chair/bed transfer   Chair/bed transfer method: Squat pivot Chair/bed transfer assist level: Maximal assist (Pt 25 - 49%/lift and lower) (+ 2) Chair/bed transfer assistive device: Mechanical lift Mechanical lift: Stedy   Locomotion Ambulation Ambulation activity did not occur: Safety/medical Editor, commissioning activity did not occur: N/A Type: Manual (dependent in tilt in space chair )      Cognition Comprehension Comprehension assist level: Understands basic less than 25% of the time/ requires cueing >75% of the time  Expression Expression assist level: Expresses basis less than 25% of the time/requires cueing >75% of the time.  Social Interaction Social Interaction assist level: Interacts appropriately less than 25% of the time. May be withdrawn or combative.  Problem Solving Problem solving assist level: Solves basic less than 25% of the time - needs direction nearly all the time or does not effectively solve problems and may need a restraint for safety  Memory Memory assist level: Recognizes or recalls 25 - 49% of the time/requires cueing 50 - 75% of the time, Recognizes or recalls less than 25% of the time/requires cueing greater than 75% of the time   Medical Problem List and Plan: 1. 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 DVT Prophylaxis/Anticoagulation: Pharmaceutical: Lovenox 3. Pain Management: legs are hypersensitive to touch which is something I noticed in the office with her last month too. Often inhibits activity    -continue low dose gabapentin, adjusted to 100mg  TID  4. Mood: LCSW to follow for evaluation and support.  5. Neuropsych: This patient is notcapable of making decisions on herown behalf. 6. Skin/Wound Care: Air mattress  overlay. Routine pressure relief measures.  -appreciate WOC RN recs  -given diflucan for yeast in vaginal/groin area 7. Fluids/Electrolytes/Nutrition:      -osmolite 1.5 at 85cc/hr currently, prostat BID as well  -prealbumin up to 23.1 most recently  -weights are inconsistent but trending up. Prealbumin 28 today   -team working with family to ensure appropriate communication regarding diet/weights  Filed Weights   10/18/16 0530 10/19/16 1030 10/21/16 0500  Weight: 43.5 kg (96 lb) 40.2 kg (88 lb 11.2 oz) 40.2 kg (88 lb 10 oz)    -BUN 20 today, continue current H20 flushes 8. MRSA tracheobronchitis: completed abx for this -  9. Hyponatremia:  improving  -at 138 9/27  -titrated free water to 200 mL 3 times a day as she's getting a little dry      10. Urinary retention: PVR's trending down into 100's generally  -I and O cath prn    - urecholine increased to 25mg  on 9/25, reduce to 10mg  today  -urine culture positive for pseudomonas--changed to cipro on 9/22---continue for 7 day course (probiotic added)   11. HTN:  Trending up slightly. Increased to norvasc 10mg  daily    12. H/o constipation:  continue current regimen for now.  13. VDRF: Chest PT bid. ATC for humidification. Suction prn. HOB elevated, aspiration precautions  14. Hypokalemia: Improved, potassium normal 15. Dyskinesias/tremors: mysoline   - 50mg  at 0700, 1200, and 1700. 100mg  QHS--has tolerated thus far.    -valium: stopped, prn klonopin   -   adjustable hinged/locking left knee brace to be worn at night    -wearing/tolerating so far.    -team noticing improved ROM/mobility with therapy/transfers 16. Breast cancer with hx of lumpectomy  -Appreciate Dr. Virgie Dad follow up. Arimidex to resume once discharged home   LOS (Days) [place] T, MD 10/21/2016 9:56 AM

## 2016-10-21 NOTE — Progress Notes (Signed)
Upon handoff, TF was off and reordered from pharmacy. TF received and this nurse went into room to administer at 2057. Upon entering room, pt's spouse was at the bedside and immediately asked "how long had the TF been off. I attempted to explain what was given to me in report and spouse interrupted "Damn! Are you not aware that she is losing weight?" without being able to answer his question posed, he asked "who is your charge nurse?" I replied that I could find out and he then took off his gown and left the room, without returning this shift. TF was administered along with ordered meds. pericare and incontinent care was provided to pt and she began to rest quietly within the bed. Trach care completed this shift along with Q6 hour bladde scan with MN=248ml as PVR and 0600 = 152. Without any noted s/s of pain noted via grimace, groan or moan. callbell within reach although spastic movements are noted via bilat upper exts. Safety maintained. Will continue to monitor.

## 2016-10-21 NOTE — Progress Notes (Signed)
Occupational Therapy Session Note  Patient Details  Name: Judy Spears MRN: [id number] Date of Birth: [birth year]  Today's Date: 10/21/2016 OT Individual Time: 7829-5621 OT Individual Time Calculation (min): 60 min    Short Term Goals: Week 3:  OT Short Term Goal 1 (Week 3): STG=LTG due to LOS  Skilled Therapeutic Interventions/Progress Updates:    Pt seen for OT session focusing on ADl re-training, pt's attention to task, and following one step commands. Pt in supine upon arrival with NT present assisting with LB dressing from bed level, hand off to OT. Pt transferred to EOB with max A +1, pt able to assist with bringing B LEs off EOB. She was able to maintain static sitting balance EOB ~15 seconds with close supervision. Max A squat pivot transfer to w/c.  Completed UB bathing/dressing and grooming tasks from w/c level. Pt able to initiate and complete face washing task with set-up of washcloth, pt reaching out for washcloth, maintaining functional grasp, and attending to all areas of face.  She was also able to thread B UEs into bra strap and shirt, pulling over elbows and assisting with pulling over head. With steps of grooming and dressing tasks, she required significantly increased time for processing directions and initiating movements. When pt not able to initiate (i.e. Pulling down shirt), once started she was able to complete. Pt left seated in w/c at end of session, family and caregiver present.  Discussed with pt's husband and caregiver family goals, focusing on ADLs vs transfers and strengthening. Pt's family wants therapist to get pt into shower as they plan to shower at home, however, beyond that would like therapy to focus on strengthening and endurance.  Therapy Documentation Precautions:  Precautions Precautions: Fall Precaution Comments: Trach, PEG Restrictions Weight Bearing Restrictions: No Pain:  No/ denies pain  See Function Navigator for Current Functional  Status.   Therapy/Group: Individual Therapy  [name], Zaion Hreha C 10/21/2016, 7:11 AM

## 2016-10-21 NOTE — Progress Notes (Signed)
Physical Therapy Session Note  Patient Details  Name: Judy Spears MRN: [id number] Date of Birth: October 19, 1950  Today's Date: 10/21/2016 PT Individual Time: 1300-1420 PT Individual Time Calculation (min): 80 min   Short Term Goals: Week 3:  PT Short Term Goal 1 (Week 3): =LTG due to length of stay   Skilled Therapeutic Interventions/Progress Updates:    Pt received sitting up in wheelchair, agreeable to therapy. Pt daughter and caregiver present for most of session. Pt total assist + 1 for squat pivot to mat. Stretch to L trunk in seated position for ~30 sec x 2. Pt sit<>stand x 3 with max assist + 1 supported under LUE. Pt performed 5 sit<>stands with steady and max assist + 2 for trunk control and push up. Pt remained standing ~2-3 seconds with slow decline with min assist for trunk control to work on standing endurance and LE strengthening. Pt transferred chair/toilet with steady and max assist +2. Pt able to remain standing in steady ~5 min with min assist + 2 and total assist for hygiene management. Pt left sitting up in chair, caregiver and RN present.   Therapy Documentation Precautions:  Precautions Precautions: Fall Precaution Comments: Trach, PEG Restrictions Weight Bearing Restrictions: No   See Function Navigator for Current Functional Status.   Therapy/Group: Individual Therapy  Gwinda Passe, SPT 10/21/2016, 3:49 PM

## 2016-10-21 NOTE — Progress Notes (Signed)
Speech Language Pathology Daily Session Note  Patient Details  Name: Judy Spears MRN: [id number] Date of Birth: December 22, 1950  Today's Date: 10/21/2016 SLP Individual Time: 1100-1200 SLP Individual Time Calculation (min): 60 min  Short Term Goals: Week 3: SLP Short Term Goal 1 (Week 3): Pt will focus attention on task or speaker for 30 seconds with Max A multimodal cues.  SLP Short Term Goal 2 (Week 3): Given Max A multimodal cues, pt will increase speech intelligibility at the simple word level to ~25%. SLP Short Term Goal 3 (Week 3): Given Max A cues, pt with return demonstrate various blowing exercises to increase respiratory support for vocalizations.  SLP Short Term Goal 4 (Week 3): Given Max A multimodal cues and oral stimulation, pt will initiate swallow in 50% of trials.   Skilled Therapeutic Interventions: Skilled treatment session focused on speech goals. Upon arrival, PMSV was in place and ptient's vitals remained [facility] throughout session on room air. SLP facilitated session by providing Max A verbal and tactile cues for patient to verbalize at the word level while naming functional items to achieve ~50% intelligibility. Patient also sang the chorus of familiar songs with Max-Total A multimodal cues to achieve 25% intelligibility. Patient with a consistent wet vocal quality throughout session that she was unable to clear despite Max-Total A multimodal cues. Patient left upright in wheelchair with all needs within reach and caregiver present. Continue with current plan of care.       Function:  Cognition Comprehension Comprehension assist level: Understands basic less than 25% of the time/ requires cueing >75% of the time  Expression Expression assistive device: Talk trach valve Expression assist level: Expresses basis less than 25% of the time/requires cueing >75% of the time.  Social Interaction Social Interaction assist level: Interacts appropriately less than 25% of the time. May be  withdrawn or combative.  Problem Solving Problem solving assist level: Solves basic less than 25% of the time - needs direction nearly all the time or does not effectively solve problems and may need a restraint for safety  Memory Memory assist level: Recognizes or recalls 25 - 49% of the time/requires cueing 50 - 75% of the time;Recognizes or recalls less than 25% of the time/requires cueing greater than 75% of the time    Pain No indications of pain   Therapy/Group: Individual Therapy  Lakiah Dhingra 10/21/2016, 3:05 PM

## 2016-10-21 NOTE — Progress Notes (Signed)
Nutrition Follow-up  DOCUMENTATION CODES:   Severe malnutrition in context of chronic illness, Underweight  INTERVENTION:  Continue current TF regimen via PEG:  Osmolite 1.5 at 85 ml/h x 20 hours per day to provide   Pro-stat 30 ml BID  Free water flushes 100 ml TID and HS  Provides 2750 kcal, 136 gm protein, 1695 ml total free water daily  Continue daily weights.   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  Ongoing   GOAL:   Patient will meet greater than or equal to 90% of their needs  Met with TF  MONITOR:   TF tolerance, Skin, Weight trends, Labs, I & O's  REASON FOR ASSESSMENT:   Consult Enteral/tube feeding initiation and management  ASSESSMENT:   66 y.o. 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 She was admitted on 09/24/16 with lethargy. SOB and low grade fevers due to MRSA tracheobronchitis v/s PNA. PEG tube evaluated by IVR and was exchanged out on 9/5. Patient has had significant decline in mobility and ability to carryout ADL tasks. CIR recommended by rehab team.  Spoke with pt's caregiver, [name], at bedside. Pt's weight was up to 90 lbs yesterday. Caregiver worries pt's weight will decline related to TF being turned off for longer than 4 hours yesterday. Pt continues to tolerate TF with no difficulty.  RD discussed with caregiver shortening feeding time for patient upon discharge.  Caregiver inquired about increasing rate of TF, RD recommended giving pt 85 mL/hr for full 20 hours and monitoring weight trends, if decline occurs, increasing rate.    Labs and medications reviewed  Diet Order:  Diet NPO time specified  Skin:  Reviewed, no  issues  Last BM:  10/20/16  Height:   Ht Readings from Last 1 Encounters:  09/30/16 '5\' 2"'$  (1.575 m)    Weight:   Wt Readings from Last 1 Encounters:  10/21/16 88 lb 10 oz (40.2 kg)    Ideal Body Weight:  50 kg  BMI:  Body mass index is 16.21 kg/m.  Estimated Nutritional Needs:   Kcal:  1700-1900  Protein:  65-75 gram  Fluid:  1.7-1.9 L/day  EDUCATION NEEDS:   No education needs identified at this time  Parks Ranger, MS, RDN, LDN 10/21/2016 4:25 PM

## 2016-10-22 ENCOUNTER — Inpatient Hospital Stay (HOSPITAL_COMMUNITY): Payer: Medicare Other

## 2016-10-22 ENCOUNTER — Inpatient Hospital Stay (HOSPITAL_COMMUNITY): Payer: Medicare Other | Admitting: Physical Therapy

## 2016-10-22 ENCOUNTER — Inpatient Hospital Stay (HOSPITAL_COMMUNITY): Payer: Medicare Other | Admitting: Speech Pathology

## 2016-10-22 ENCOUNTER — Encounter (HOSPITAL_COMMUNITY): Payer: Medicare Other | Admitting: Speech Pathology

## 2016-10-22 LAB — GLUCOSE, CAPILLARY
GLUCOSE-CAPILLARY: 139 mg/dL — AB (ref 65–99)
Glucose-Capillary: 105 mg/dL — ABNORMAL HIGH (ref 65–99)
Glucose-Capillary: 123 mg/dL — ABNORMAL HIGH (ref 65–99)
Glucose-Capillary: 138 mg/dL — ABNORMAL HIGH (ref 65–99)

## 2016-10-22 MED ORDER — PRIMIDONE 50 MG PO TABS
50.0000 mg | ORAL_TABLET | Freq: Every day | ORAL | Status: DC
  Administered 2016-10-22 – 2016-10-26 (×5): 50 mg
  Filled 2016-10-22 (×5): qty 1

## 2016-10-22 MED ORDER — GABAPENTIN 100 MG PO CAPS
100.0000 mg | ORAL_CAPSULE | Freq: Two times a day (BID) | ORAL | Status: DC
  Administered 2016-10-22 – 2016-10-27 (×10): 100 mg via ORAL
  Filled 2016-10-22 (×10): qty 1

## 2016-10-22 NOTE — Progress Notes (Signed)
Occupational Therapy Weekly Progress Note  Patient Details  Name: ZAMARAH ULLMER MRN: [id number] Date of Birth: May 17, 1950  Beginning of progress report period: October 15, 2016 End of progress report period: October 22, 2016  Today's Date: 10/22/2016 OT Individual Time: 1330-1500 OT Individual Time Calculation (min): 90 min    Short term goals not established last week as pt with planned d/c home today, however, due to medical reasons stay extended. Cont to work towards LTG Pt is making steady progress in OT. She is able to consistently transfer with max- total A +1. She is shown increased initiation and functional movements in UEs to assist with ADLs, however, pt's family and caregivers are requesting that OT focus on functional transfers vs basic ADLs in order to ease transfers as pt has paid caregivers who can assist with basic ADLs. As a result, will not add more functional goals (i.e. Grooming) to pt's POC as per family request it is not a priority at this time.   Pt's rehab admission extended until 10/3 in order to ensure medical stability and cont to work towards OT goals. Cont towards goals of overall max A.   Patient continues to demonstrate the following deficits: abnormal posture, ataxia, cognitive deficits, disturbance of vision and muscle weakness (generalized)  and therefore will continue to benefit from skilled OT intervention to enhance overall performance with BADL and Reduce care partner burden.  Patient progressing toward long term goals..  Continue plan of care.  OT Short Term Goals Week 3:  OT Short Term Goal 1 (Week 3): STG=LTG due to LOS OT Short Term Goal 1 - Progress (Week 3): Progressing toward goal Week 4:  OT Short Term Goal 1 (Week 4): Cont towards goals of max A overall  Skilled Therapeutic Interventions/Progress Updates:    Pt seen for OT ADL bathing/dressing session. Pt sitting up in w/c upon arrival with husband and personal care giver present. Plans for  shower today as pt's family intends to shower her at d/c, they have a roll in shower chair. Set-up transfers and showering task in simulation of home environment. Pt's caregivers provided assist for all transfers throughout session which required max A stand pivots. She bathed with total A seated on roll in shower chair, positioned with pillows and feet support to facilitate upright and midline sitting. Pt tolerated shower well, though visibly fatigued at end of session. Dressed completed from w/c level with max- total A, pt's alertness fluctuated throughout dressing portion.  Per family's request, weighed pt in chair at end of session. Then provided AAROM B UEs at all joints.  Pt left in chair with personal care attendant.  Education provided throughout session regarding energy conservation, recommendation for safety considerations for bathing tasks at home, DME, reducing caregiver burden, and d/c planning.    Therapy Documentation Precautions:  Precautions Precautions: Fall Precaution Comments: Trach, PEG Restrictions Weight Bearing Restrictions: No Pain:   No/ denies pain  See Function Navigator for Current Functional Status.   Therapy/Group: Individual Therapy  [name], Catrell Morrone C 10/22/2016, 7:12 AM

## 2016-10-22 NOTE — Progress Notes (Signed)
Speech Language Pathology Weekly Progress and Session Note  Patient Details  Name: Judy Spears MRN: [id number] Date of Birth: August 23, 1950  Beginning of progress report period: October 15, 2016 End of progress report period: October 22, 2016  Today's Date: 10/22/2016 SLP Individual Time: 1100-1200 SLP Individual Time Calculation (min): 60 min  Short Term Goals: Week 3: SLP Short Term Goal 1 (Week 3): Pt will focus attention on task or speaker for 30 seconds with Max A multimodal cues.  SLP Short Term Goal 1 - Progress (Week 3): Not met SLP Short Term Goal 2 (Week 3): Given Max A multimodal cues, pt will increase speech intelligibility at the simple word level to ~25%. SLP Short Term Goal 2 - Progress (Week 3): Not met SLP Short Term Goal 3 (Week 3): Given Max A cues, pt with return demonstrate various blowing exercises to increase respiratory support for vocalizations.  SLP Short Term Goal 3 - Progress (Week 3): Not met SLP Short Term Goal 4 (Week 3): Given Max A multimodal cues and oral stimulation, pt will initiate swallow in 50% of trials.  SLP Short Term Goal 4 - Progress (Week 3): Not met    New Short Term Goals: Week 4: SLP Short Term Goal 1 (Week 4): Pt will focus attention on task or speaker for 30 seconds with Max A multimodal cues.  SLP Short Term Goal 2 (Week 4): Given Max A multimodal cues, pt will increase speech intelligibility at the simple word level to ~25%. SLP Short Term Goal 3 (Week 4): Given Max A cues, pt with return demonstrate various blowing exercises to increase respiratory support for vocalizations.  SLP Short Term Goal 4 (Week 4): Given Max A multimodal cues and oral stimulation, pt will initiate swallow in 50% of trials.   Weekly Progress Updates:     Pt with little to no progress towards goals this reporting period d/t substantial neurological deficits. Pt with some sessions of increased focused attention to functionally pt has not been able to  demonstrate overall ability to increase focused attention. Pt is occasionally able to produce vocalizations (with Total A to Max A cues) in ~ 25% of opportunities but this doesn't increase speech intelligibility or ability to verbally communicate her wants/needs.   Intensity: Minumum of 1-2 x/day, 30 to 90 minutes Frequency: 3 to 5 out of 7 days Duration/Length of Stay: 10/3 Treatment/Interventions: Cognitive remediation/compensation;Cueing hierarchy;Functional tasks;Patient/family education;Speech/Language facilitation;Dysphagia/aspiration precaution training   Daily Session  Skilled Therapeutic Interventions: Skilled treatment session focused on communication goals. SLP facilitated session by providing Total A multimodal cues to increase respiratory function. Pt able to produce voicing x 1 but was able to use eye gaze to select written word "pee" in field of 2 similar words. Education provided on use of written word with eye gaze to increase opportunities to communicate wants and needs. Pt left upright in wheelchair with personal attendant.     Function:     Cognition Comprehension Comprehension assist level: Understands basic less than 25% of the time/ requires cueing >75% of the time  Expression Expression assistive device: Talk trach valve Expression assist level: Expresses basis less than 25% of the time/requires cueing >75% of the time.  Social Interaction Social Interaction assist level: Interacts appropriately less than 25% of the time. May be withdrawn or combative.  Problem Solving Problem solving assist level: Solves basic less than 25% of the time - needs direction nearly all the time or does not effectively solve problems and may need  a restraint for safety  Memory Memory assist level: Recognizes or recalls less than 25% of the time/requires cueing greater than 75% of the time   General    Pain    Therapy/Group: Individual Therapy  Judy Spears 10/22/2016, 4:23  PM

## 2016-10-22 NOTE — Progress Notes (Signed)
Physical Therapy Weekly Progress Note  Patient Details  Name: Judy Spears MRN: [id number] Date of Birth: [birth year]  Beginning of progress report period: October 15, 2016 End of progress report period: October 22, 2016  Today's Date: 10/22/2016 PT Individual Time: 1655-3748 PT Individual Time Calculation (min): 70 min   Patient has met 1 of 3 long term goals.  Patient requires totalA for bed mobility d/t incoordination and spasticity/tone limiting patient's ability to actively initiate rolling or UE/LE movement required for supine <>sit. Able to demonstrate modified stand pivot transfer with maxA w/c <>bed or mat table with pt actively initiating sit >stand with RLE to A with transfer. Standing in stedy progressed to maxA and able to demonstrate static standing position with standbyA >1 min, however continue to recommend +2 for use of stedy for transfers and toileting as standing balance/tolerance not yet consistent. Patient continues to be limited by spasticity, increased tone, postural malalignments, contractures and ataxia. Patients caregiver and/or husband present for all sessions to participate in ongoing training and education.   Patient continues to demonstrate the following deficits muscle weakness and muscle joint tightness, decreased cardiorespiratoy endurance and decreased oxygen support, impaired timing and sequencing, abnormal tone, unbalanced muscle activation, ataxia, decreased coordination and decreased motor planning, decreased visual acuity, decreased motor planning, decreased attention and delayed processing, central origin vestibular impairments and decreased sitting balance, decreased standing balance, decreased postural control, decreased balance strategies and spastic tetraparesis and therefore will continue to benefit from skilled PT intervention to increase functional independence with mobility.  Patient progressing toward long term goals..  Continue plan of care.  PT  Short Term Goals Week 2:  PT Short Term Goal 1 (Week 2): =LTG due to estimated LOS Week 3:  PT Short Term Goal 1 (Week 3): =LTG due to length of stay   Skilled Therapeutic Interventions/Progress Updates: Pt received seated in w/c, no evidence and no c/o pain and agreeable to treatment. Transported to gym [place]. Pt attempted to vocalize something to therapists however unable to determine what she was attempting to say. Pt transferred [place] to mat and noted to have been incontinent with accident in brief; suspect pt was attempting to tell therapist about incontinence. Discussed with husband and caregiver that it may be beneficial to determine an easy way to communicate toileting needs; will discuss with SLP. Sit <>stand in stedy to transfer w/c <>BSC, and standing tolerance 4 reps x2-3 min per stand with mod/maxA, occasionally able to demonstrate static standing with min guard temporarily but fatigues quickly. Toileting steps completed totalA +2. Transfer w/c <>mat table with maxA squat pivot. R sidelying trunk stretch propped on pillow while attempting L hip ROM, however pt unable to tolerate any stimuli to LLE and clearly vocalizing discomfort. Educated pt's husband on potential need for w/c seating system modifications in the future depending on progression, and reinforced need for frequent and consistent pressure relief when pt sitting in w/c to reduce risk of skin breakdown.  Returned to room totalA; remained seated in w/c with husband present and all needs in reach.     Therapy Documentation Precautions:  Precautions Precautions: Fall Precaution Comments: Trach, PEG Restrictions Weight Bearing Restrictions: No Pain: Pain Assessment Pain Assessment: Faces Faces Pain Scale: No hurt   See Function Navigator for Current Functional Status.  Therapy/Group: Individual Therapy  Luberta Mutter 10/22/2016, 11:12 AM

## 2016-10-22 NOTE — Progress Notes (Signed)
[facility] PHYSICAL MEDICINE & REHABILITATION     PROGRESS NOTE    Subjective/Complaints:  Had a good night. A little more sleepy in the am.  ROS: pt denies nausea, vomiting, diarrhea, cough, shortness of breath or chest pain   Objective: Vital Signs: Blood pressure (!) 135/93, pulse 84, temperature 99.3 F (37.4 C), temperature source Axillary, resp. rate 20, height 5\' 2"  (1.575 m), weight 40.2 kg (88 lb 10 oz), last menstrual period 12/01/2010, SpO2 100 %. No results found.  Recent Labs  10/19/16 1524  WBC 7.4  HGB 13.7  HCT 42.6  PLT 383    Recent Labs  10/19/16 1524 10/21/16 0716  NA 138 138  K 4.1 3.7  CL 98* 97*  GLUCOSE 124* 110*  BUN 23* 20  CREATININE 0.36* 0.35*  CALCIUM 9.6 9.1   CBG (last 3)   Recent Labs  10/21/16 1753 10/21/16 2355 10/22/16 0556  GLUCAP 106* 112* 138*    Wt Readings from Last 3 Encounters:  10/21/16 40.2 kg (88 lb 10 oz)  09/30/16 44.2 kg (97 lb 6.4 oz)  07/14/16 46.5 kg (102 lb 9.6 oz)    Physical Exam:  Constitutional:   Remains cachectic. No distress.  HENT:  Head: NCAT Mouth/Throat: Oropharynx is dry Eyes: PERRL Neck: trach without secretions.  Cardiovascular: RRR without murmur. No JVD      .    Respiratory: clear, no rhonchi   GI: Soft. Bowel sounds are normal. She exhibits no distension. There is no tenderness.   Musculoskeletal: fairly easily able to extend LLE to -25 degrees Neuro:   A little slow to arouse. Ongoing flexor tone in UE's with rhythmic movements /dyskinesias noted.[name] LLE flexor tone--- PROM to -20 degrees. Wearing LUE brace.   extensor tone RLE.  Is sensitive to ROM particularly on LLE, but RLE is sensitive too.    Skin: a few scattered abrasions. some yeast around vaginal/pelvic area Psych: flat  Assessment/Plan: 1. Functional and mobility deficits secondary to recent [place] encephalitis and subsequent pneumonia/bronchitis/respiratory sequelae which require 3+ hours per day of  interdisciplinary therapy in a comprehensive inpatient rehab setting. Physiatrist is providing close team supervision and 24 hour management of active medical problems listed below. Physiatrist and rehab team continue to assess barriers to discharge/monitor patient progress toward functional and medical goals.  Function:  Bathing Bathing position Bathing activity did not occur: Safety/medical concerns (total A bed level night bath) Position: Bed  Bathing parts   Body parts bathed by helper: Right arm, Left arm, Chest, Abdomen, Front perineal area, Buttocks, Right upper leg, Left upper leg, Right lower leg, Left lower leg, Back  Bathing assist Assist Level: 2 helpers      Upper Body Dressing/Undressing Upper body dressing   What is the patient wearing?: Bra, Pull over shirt/dress Bra - Perfomed by patient: Thread/unthread right bra strap, Thread/unthread left bra strap, Hook/unhook bra (pull down sports bra) Bra - Perfomed by helper: Thread/unthread right bra strap, Thread/unthread left bra strap, Hook/unhook bra (pull down sports bra) Pull over shirt/dress - Perfomed by patient: Thread/unthread right sleeve, Thread/unthread left sleeve, Put head through opening Pull over shirt/dress - Perfomed by helper: Pull shirt over trunk        Upper body assist Assist Level: Touching or steadying assistance(Pt > 75%)      Lower Body Dressing/Undressing Lower body dressing   What is the patient wearing?: Pants, Socks, Shoes       Pants- Performed by helper: Thread/unthread right pants leg,  Pull pants up/down, Fasten/unfasten pants, Thread/unthread left pants leg       Socks - Performed by helper: Don/doff left sock, Don/doff right sock   Shoes - Performed by helper: Don/doff right shoe, Don/doff left shoe, Fasten right, Fasten left          Lower body assist Assist for lower body dressing: 2 Automotive engineer activity did not occur: No continent  bowel/bladder event   Toileting steps completed by helper: Adjust clothing prior to toileting, Performs perineal hygiene, Adjust clothing after toileting    Toileting assist Assist level: Two helpers   Transfers Chair/bed transfer   Chair/bed transfer method: Squat pivot Chair/bed transfer assist level: Maximal assist (Pt 25 - 49%/lift and lower) (+ 2) Chair/bed transfer assistive device: Mechanical lift Mechanical lift: Stedy   Locomotion Ambulation Ambulation activity did not occur: Safety/medical Editor, commissioning activity did not occur: N/A Type: Manual (dependent in tilt in space chair )      Cognition Comprehension Comprehension assist level: Understands basic less than 25% of the time/ requires cueing >75% of the time  Expression Expression assist level: Expresses basis less than 25% of the time/requires cueing >75% of the time.  Social Interaction Social Interaction assist level: Interacts appropriately less than 25% of the time. May be withdrawn or combative.  Problem Solving Problem solving assist level: Solves basic less than 25% of the time - needs direction nearly all the time or does not effectively solve problems and may need a restraint for safety  Memory Memory assist level: Recognizes or recalls 25 - 49% of the time/requires cueing 50 - 75% of the time, Recognizes or recalls less than 25% of the time/requires cueing greater than 75% of the time   Medical Problem List and Plan: 1. 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 Substantial comparative cortical and cerebellar atrophy.   -lengthy conference with all family members to review pertinent medical issues. Over an hour was spent in discussion 2. DVT Prophylaxis/Anticoagulation:  Pharmaceutical: Lovenox 3. Pain Management: legs are hypersensitive to touch which is something I noticed in the office with her last month too. Often inhibits activity    -continue low dose gabapentin, adjusted to 100mg  TID  4. Mood: LCSW to follow for evaluation and support.  5. Neuropsych: This patient is notcapable of making decisions on herown behalf. 6. Skin/Wound Care: Air mattress overlay. Routine pressure relief measures.  -appreciate WOC RN recs  -given diflucan for yeast in vaginal/groin area 7. Fluids/Electrolytes/Nutrition:      -osmolite 1.5 at 85cc/hr currently, prostat BID as well---continue this at home, can shift to overnight schedule.   -prealbumin up to 23.1 most recently  -weights are trending up. Prealbumin 28   -team working with family to ensure appropriate communication regarding diet/weights  Filed Weights   10/18/16 0530 10/19/16 1030 10/21/16 0500  Weight: 43.5 kg (96 lb) 40.2 kg (88 lb 11.2 oz) 40.2 kg (88 lb 10 oz)    -BUN 20 on 9/27, continue current H20 flushes 8. MRSA tracheobronchitis: completed abx for this -  9. Hyponatremia:  improving  -at 138 9/27  -titrated free water to 200 mL 3 times a day as she's getting a little dry  10. Urinary retention: PVR's trending down into 100's generally  -I and O cath prn    - urecholine increased to 25mg  on 9/25, reduce to 10mg  today  -urine culture positive for pseudomonas--changed to cipro on 9/22---continue for 7 day course (probiotic added)   11. HTN:  Trending up slightly. Increased to norvasc 10mg  daily    12. H/o constipation:   continue current regimen for now.  13. VDRF: Chest PT bid. ATC for humidification. Suction prn. HOB elevated, aspiration precautions    14. Hypokalemia: Improved, potassium normal 15. Dyskinesias/tremors: mysoline   - 50mg  at 0700, 1200, and 1700.    -will decrease HS mysoline to see if helps am drowsiness.    -valium: stopped, prn klonopin   -   adjustable  hinged/locking left knee brace to be worn at night    -wearing/tolerating so far.    -team noticing improved ROM/mobility with therapy/transfers 16. Breast cancer with hx of lumpectomy  -Appreciate Dr. Virgie Dad follow up. Arimidex to resume once discharged home   LOS (Days) [place] T, MD 10/22/2016 10:04 AM

## 2016-10-22 NOTE — Progress Notes (Addendum)
Social Work Patient ID: Judy Spears, female   DOB: [birth year], 66 y.o.   MRN: [id number]   Patient/Family Conference  Patient/family in attendance: Pt's spouse and daughter, Morey Hummingbird.  Two other adult children were on speaker phone throughout conference as well.  Staff in attendance:  Dr. [name];  Lennart Pall, LCSW;  Verdis Frederickson, RN;   Kem Parkinson, PT;  Napoleon Form, OT;  Stormy Fabian, ST.  Main focus:  Family requested team report/ update  Synopsis of information shared: All disciplines provided update and Dr. [name] reviewed pt's multiple medical issues  Barriers/concerns expressed by patient and family: Family with several questions which were primarily medical in nature and they were answered accordingly.  No barriers or concerns.  Patient/family response:  Family very appreciative of hour long meeting and all information provided and discussed.  Spouse and daughter report they felt all of their questions were answered.  Follow-up/action plans:  SW to follow up on potential of percussion vest for home use and to assist with getting referral to [facility] [place] [facility].  [name], LCSW

## 2016-10-23 ENCOUNTER — Inpatient Hospital Stay (HOSPITAL_COMMUNITY): Payer: Medicare Other | Admitting: Occupational Therapy

## 2016-10-23 ENCOUNTER — Encounter (HOSPITAL_COMMUNITY): Payer: Self-pay

## 2016-10-23 LAB — GLUCOSE, CAPILLARY
Glucose-Capillary: 100 mg/dL — ABNORMAL HIGH (ref 65–99)
Glucose-Capillary: 125 mg/dL — ABNORMAL HIGH (ref 65–99)

## 2016-10-23 NOTE — Plan of Care (Signed)
Problem: RH PAIN MANAGEMENT Goal: RH STG PAIN MANAGED AT OR BELOW PT'S PAIN GOAL Face scale of less than or equal to 2.  Outcome: Progressing Pt without s/s of pain

## 2016-10-23 NOTE — Progress Notes (Signed)
[facility]IC[facility] RE[facility]ABILITATION     PROGRESS NOTE    Subjective/Complaints:  RN states she really didn't sleep much, off/on---Pt indicates that she slept  ROS: limited due to cognitive  Objective: Vital Signs: Blood pressure 130/86, pulse 80, temperature 97.8 F (36.6 C), temperature source Axillary, resp. rate 18, height 5\' 2"  (1.575 m), weight 42.2 kg (93 lb), last menstrual period 12/01/2010, SpO2 98 %. No results found. No results for input(s): WBC, HGB, HCT, PLT in the last 72 hours.  Recent Labs  10/21/16 0716  NA 138  K 3.7  CL 97*  GLUCOSE 110*  BUN 20  CREATININE 0.35*  CALCIUM 9.1   CBG (last 3)   Recent Labs  10/22/16 1156 10/22/16 1751 10/23/16 0002  GLUCAP 105* 139* 123*    Wt Readings from Last 3 Encounters:  10/23/16 42.2 kg (93 lb)  09/30/16 44.2 kg (97 lb 6.4 oz)  07/14/16 46.5 kg (102 lb 9.6 oz)    Physical Exam:  Constitutional:   Remains cachectic. No distress.  HENT:  Head: NCAT Mouth/Throat: Oropharynx is dry Eyes: PERRL Neck: trach open/no draiange Cardiovascular: RRR without murmur. No JVD      .    Respiratory: normal effort. No rhonchi or rales  GI: Soft. Bowel sounds are normal. She exhibits no distension. There is no tenderness.   Musculoskeletal: fairly easily able to extend LLE to -25 degrees Neuro:  More alert, quikcer to arouse today. Ongoing flexor tone in UE's with rhythmic movements /dyskinesias noted.[name] LLE flexor tone--- PROM to -10-20 degrees. Wearing LUE brace.   extensor tone RLE.  Is sensitive to ROM particularly on LLE, but RLE is sensitive too.    Skin: a few scattered abrasions. some yeast around vaginal/pelvic area Psych: flat but cooperative  Assessment/Plan: 1. Functional and mobility deficits secondary to recent [place] encephalitis and subsequent pneumonia/bronchitis/respiratory sequelae which require 3+ hours per day of interdisciplinary therapy in a comprehensive inpatient rehab  setting. Physiatrist is providing close team supervision and 24 hour management of active medical problems listed below. Physiatrist and rehab team continue to assess barriers to discharge/monitor patient progress toward functional and medical goals.  Function:  Bathing Bathing position Bathing activity did not occur: Safety/medical concerns (total A bed level night bath) Position: Shower  Bathing parts   Body parts bathed by helper: Right arm, Left arm, Chest, Abdomen, Front perineal area, Buttocks, Right upper leg, Left upper leg, Right lower leg, Left lower leg, Back  Bathing assist Assist Level: 2 helpers      Upper Body Dressing/Undressing Upper body dressing   What is the patient wearing?: Bra, Pull over shirt/dress Bra - Perfomed by patient: Thread/unthread right bra strap, Thread/unthread left bra strap, Hook/unhook bra (pull down sports bra) Bra - Perfomed by helper: Thread/unthread right bra strap, Thread/unthread left bra strap, Hook/unhook bra (pull down sports bra) Pull over shirt/dress - Perfomed by patient: Thread/unthread right sleeve, Thread/unthread left sleeve, Put head through opening Pull over shirt/dress - Perfomed by helper: Thread/unthread right sleeve, Thread/unthread left sleeve, Put head through opening, Pull shirt over trunk        Upper body assist Assist Level: Touching or steadying assistance(Pt > 75%)      Lower Body Dressing/Undressing Lower body dressing   What is the patient wearing?: Pants, Socks, Shoes       Pants- Performed by helper: Thread/unthread right pants leg, Pull pants up/down, Fasten/unfasten pants, Thread/unthread left pants leg       Socks -  Performed by helper: Don/doff left sock, Don/doff right sock   Shoes - Performed by helper: Don/doff right shoe, Don/doff left shoe, Fasten right, Fasten left          Lower body assist Assist for lower body dressing: 2 Helpers      Naval architect activity did not  occur: No continent bowel/bladder event   Toileting steps completed by helper: Adjust clothing prior to toileting, Performs perineal hygiene, Adjust clothing after toileting    Toileting assist Assist level: Two helpers   Transfers Chair/bed transfer   Chair/bed transfer method: Squat pivot Chair/bed transfer assist level: Maximal assist (Pt 25 - 49%/lift and lower) (+ 2) Chair/bed transfer assistive device: Mechanical lift Mechanical lift: Stedy   Locomotion Ambulation Ambulation activity did not occur: Safety/medical Editor, commissioning activity did not occur: N/A Type: Manual (dependent in tilt in space chair )      Cognition Comprehension Comprehension assist level: Understands basic less than 25% of the time/ requires cueing >75% of the time  Expression Expression assist level: Expresses basis less than 25% of the time/requires cueing >75% of the time.  Social Interaction Social Interaction assist level: Interacts appropriately less than 25% of the time. May be withdrawn or combative.  Problem Solving Problem solving assist level: Solves basic less than 25% of the time - needs direction nearly all the time or does not effectively solve problems and may need a restraint for safety  Memory Memory assist level: Recognizes or recalls less than 25% of the time/requires cueing greater than 75% of the time   Medical Problem List and Plan: 1. 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 Substantial comparative cortical and cerebellar atrophy.   -lengthy conference with all family members to review pertinent medical issues on 9/28. Over an hour was spent in discussion 2. DVT Prophylaxis/Anticoagulation: Pharmaceutical: Lovenox 3. Pain Management: legs are  hypersensitive to touch which is something I noticed in the office with her last month too. Often inhibits activity    -continue low dose gabapentin, adjusted to 100mg  BID to improve arousal 4. Mood: LCSW to follow for evaluation and support.  5. Neuropsych: This patient is notcapable of making decisions on herown behalf. 6. Skin/Wound Care: Air mattress overlay. Routine pressure relief measures.  -appreciate WOC RN recs  -given diflucan for yeast in vaginal/groin area 7. Fluids/Electrolytes/Nutrition:      -osmolite 1.5 at 85cc/hr currently, prostat BID as well---continue this at home, can shift to overnight schedule.   -prealbumin up to 28 most recently  -weights continue to trend up   -team working with family to ensure appropriate communication regarding diet/weights  Filed Weights   10/21/16 0500 10/22/16 1500 10/23/16 0613  Weight: 40.2 kg (88 lb 10 oz) 41.8 kg (92 lb 3.2 oz) 42.2 kg (93 lb)    -BUN 20 on 9/27, continue current H20 flushes 8. MRSA tracheobronchitis: completed abx for this -  9. Hyponatremia:  improving  -at 138 9/27  -titrated free water to 200 mL 3 times a day as she's getting a little dry      10. Urinary retention: PVR's trending down into 100's generally  -I and O cath prn    - urecholine increased to 25mg  on 9/25, reduce to 10mg  today  -  urine culture positive for pseudomonas--changed to cipro on 9/22---continue for 7 day course (probiotic added)   11. HTN:  Trending up slightly. Increased to norvasc 10mg  daily    12. H/o constipation:   continue current regimen for now.  13. VDRF: Chest PT bid. ATC for humidification. Suction prn. HOB elevated, aspiration precautions    14. Hypokalemia: Improved, potassium normal 15. Dyskinesias/tremors: mysoline   - 50mg  at 0700, 1200, and 1700.    -have decreased HS mysoline to decrease AM drowsiness    -valium: stopped, prn klonopin   -   adjustable hinged/locking left knee brace to be worn at  night    -wearing/tolerating so far and seems to be tolerating   -team noticing improved ROM/mobility with therapy/transfers 16. Breast cancer with hx of lumpectomy  -Appreciate Dr. Virgie Dad follow up. Arimidex to resume once discharged home   LOS (Days) Gladstone T, MD 10/23/2016 9:04 AM

## 2016-10-23 NOTE — Progress Notes (Signed)
Occupational Therapy Session Note  Patient Details  Name: Judy Spears MRN: [id number] Date of Birth: Oct 12, 1950  Today's Date: 10/23/2016 OT Individual Time: 1002-1058 OT Individual Time Calculation (min): 56 min    Short Term Goals: Week 4:  OT Short Term Goal 1 (Week 4): Cont towards goals of max A overall  Skilled Therapeutic Interventions/Progress Updates:    Treatment session focused on transfer training, caregiver education, static sitting balance, ther activity, and functional reaching tasks. Upon entering room, pts husband present and therapist and husband discuss pts PLOF and CLOF. Therapist educated caregiver about transfer safety in home and noted that PCA is mostly taking care of her. Pt completed SPT from w/c to therapy mat and tolerated upright sitting with mod-max support for 8 minutes with noted L sided lean. Therapist instructed pt to use R UE to hold on to side of mat for support. Noted ataxia, tremors, and contractures in B UE/LE. Pt performed supported sitting functional reaching activity with AAROM/Hand over hand support for picking up cups and giving them to husband, focus on trunk control,crossing midline, and functional grasping patterns. No c/o pain noted during session. Pt returned to room via w/c with husband with needs met.   Therapy Documentation Precautions:  Precautions Precautions: Fall Precaution Comments: Trach, PEG Restrictions Weight Bearing Restrictions: No Vital Signs: Therapy Vitals Pulse Rate: 78 Resp: 18 Patient Position (if appropriate): Sitting Oxygen Therapy SpO2: 96 % O2 Device: Not Delivered O2 Flow Rate (L/min): 5 L/min FiO2 (%):  (room air) Pain: Pain Assessment Pain Assessment: No/denies pain Faces Pain Scale: No hurt  See Function Navigator for Current Functional Status.   Therapy/Group: Individual Therapy  Delon Sacramento 10/23/2016, 12:19 PM

## 2016-10-24 ENCOUNTER — Inpatient Hospital Stay (HOSPITAL_COMMUNITY): Payer: Medicare Other

## 2016-10-24 DIAGNOSIS — Z930 Tracheostomy status: Secondary | ICD-10-CM

## 2016-10-24 LAB — CULTURE, RESPIRATORY

## 2016-10-24 LAB — GLUCOSE, CAPILLARY: GLUCOSE-CAPILLARY: 102 mg/dL — AB (ref 65–99)

## 2016-10-24 LAB — CULTURE, RESPIRATORY W GRAM STAIN

## 2016-10-24 MED ORDER — BETHANECHOL CHLORIDE 25 MG PO TABS
25.0000 mg | ORAL_TABLET | Freq: Three times a day (TID) | ORAL | Status: DC
  Administered 2016-10-24 – 2016-10-27 (×9): 25 mg
  Filled 2016-10-24 (×9): qty 1

## 2016-10-24 NOTE — Progress Notes (Signed)
Occupational Therapy Session Note  Patient Details  Name: Judy Spears MRN: [id number] Date of Birth: [birth year]  Today's Date: 10/24/2016 OT Individual Time: 1000-1055 OT Individual Time Calculation (min): 55 min    Short Term Goals: Week 3:  OT Short Term Goal 1 (Week 3): STG=LTG due to LOS OT Short Term Goal 1 - Progress (Week 3): Progressing toward goal  Skilled Therapeutic Interventions/Progress Updates:    1;1. Pt alert with husband present throughout session. Focus of session stand pivot transfers, siting balance, weight shifting anterior/posterior, initiation and sit to stand. Pt stand pivot transfer MAX A w/c<>EOM with manual facilitation of weight shifting and knee blocking. Pt able to initiate weight shift forward. Pt sits EOM ~20 min with MOD- brief intermittant supervision. Pt able to initate reaching R to side of mat to steady herself with HOH A for far reach laterally. With hand placed on ball on high low table to shifts weight and reaches forward to facilitate ant/post weight shift in prep for transfers/standing with MAX HOH A to reaching up to ball and MOD A for weight shift. Pt sit to stand x4 at paralell bar with A to bring hands up to bar and B knee blocking/facilitation of weight bearing on LLE with +2 for balance. Pt with notable trunk rotation to L. OT places towel in w/c with to facilitate normal trunk alignment/twisting R to neutral while OT applies PROM to LUE elbow, forearm and shoulder rotators. OT removed towel prior to exiting room with pt seated in w/c with husband present.    Therapy Documentation Precautions:  Precautions Precautions: Fall Precaution Comments: Trach, PEG Restrictions Weight Bearing Restrictions: No  See Function Navigator for Current Functional Status.   Therapy/Group: Individual Therapy  Tonny Branch 10/24/2016, 10:58 AM

## 2016-10-24 NOTE — Progress Notes (Signed)
[facility] MEDICINE & REHABILITATION     PROGRESS NOTE    Subjective/Complaints:  Had a good night per staff. Alert and comfortable in bed this morning  ROS: Limited due to cognitive/behavioral   Objective: Vital Signs: Blood pressure 126/71, pulse 89, temperature 98.4 F (36.9 C), temperature source Oral, resp. rate 20, height 5\' 2"  (1.575 m), weight 38.6 kg (85 lb), last menstrual period 12/01/2010, SpO2 99 %. No results found. No results for input(s): WBC, HGB, HCT, PLT in the last 72 hours. No results for input(s): NA, K, CL, GLUCOSE, BUN, CREATININE, CALCIUM in the last 72 hours.  Invalid input(s): CO CBG (last 3)   Recent Labs  10/23/16 0002 10/23/16 1849 10/23/16 2358  GLUCAP 123* 100* 125*    Wt Readings from Last 3 Encounters:  10/24/16 38.6 kg (85 lb)  09/30/16 44.2 kg (97 lb 6.4 oz)  07/14/16 46.5 kg (102 lb 9.6 oz)    Physical Exam:  Constitutional:    . No distress.  HENT:  Head: NCAT Mouth/Throat: Oropharynx is dry Eyes: PERRL Neck: trach clean, no drainage Cardiovascular: RRR without murmur. No JVD       .    Respiratory: normal effort GI: Soft. Bowel sounds are normal. She exhibits no distension. There is no tenderness.   Musculoskeletal: no tenderness at rest Neuro:  More alert, quikcer to arouse today. Ongoing flexor tone in UE's with rhythmic movements /dyskinesias noted.[name] LLE flexor tone--- PROM to -10 degrees. Wearing LUE brace.   extensor tone RLE.  Is sensitive to ROM particularly on LLE, but RLE is sensitive too.    Skin: a few scattered abrasions. some yeast around vaginal/pelvic area Psych: flat but cooperative  Assessment/Plan: 1. Functional and mobility deficits secondary to recent [place] encephalitis and subsequent pneumonia/bronchitis/respiratory sequelae which require 3+ hours per day of interdisciplinary therapy in a comprehensive inpatient rehab setting. Physiatrist is providing close team supervision and 24 hour  management of active medical problems listed below. Physiatrist and rehab team continue to assess barriers to discharge/monitor patient progress toward functional and medical goals.  Function:  Bathing Bathing position Bathing activity did not occur: Safety/medical concerns (total A bed level night bath) Position: Shower  Bathing parts   Body parts bathed by helper: Right arm, Left arm, Chest, Abdomen, Front perineal area, Buttocks, Right upper leg, Left upper leg, Right lower leg, Left lower leg, Back  Bathing assist Assist Level: 2 helpers      Upper Body Dressing/Undressing Upper body dressing   What is the patient wearing?: Pull over shirt/dress, Bra Bra - Perfomed by patient: Thread/unthread right bra strap, Thread/unthread left bra strap, Hook/unhook bra (pull down sports bra) Bra - Perfomed by helper: Thread/unthread right bra strap, Thread/unthread left bra strap, Hook/unhook bra (pull down sports bra) Pull over shirt/dress - Perfomed by patient: Thread/unthread right sleeve, Thread/unthread left sleeve, Put head through opening Pull over shirt/dress - Perfomed by helper: Thread/unthread right sleeve, Thread/unthread left sleeve, Put head through opening, Pull shirt over trunk        Upper body assist Assist Level: 2 helpers      Lower Body Dressing/Undressing Lower body dressing   What is the patient wearing?: Socks, Shoes, Pants       Pants- Performed by helper: Thread/unthread right pants leg, Thread/unthread left pants leg, Pull pants up/down, Fasten/unfasten pants       Socks - Performed by helper: Don/doff right sock, Don/doff left sock   Shoes - Performed by helper: Don/doff right  shoe, Don/doff left shoe, Fasten right, Fasten left          Lower body assist Assist for lower body dressing: 2 Helpers      Naval architect activity did not occur: No continent bowel/bladder event   Toileting steps completed by helper: Adjust clothing prior to  toileting, Performs perineal hygiene, Adjust clothing after toileting    Toileting assist Assist level: Two helpers   Transfers Chair/bed transfer   Chair/bed transfer method: Squat pivot Chair/bed transfer assist level: Maximal assist (Pt 25 - 49%/lift and lower) Chair/bed transfer assistive device: Mechanical lift Mechanical lift: Stedy   Locomotion Ambulation Ambulation activity did not occur: Safety/medical Editor, commissioning activity did not occur: N/A Type: Manual (dependent in tilt in space chair )      Cognition Comprehension Comprehension assist level: Understands basic less than 25% of the time/ requires cueing >75% of the time  Expression Expression assist level: Expresses basis less than 25% of the time/requires cueing >75% of the time.  Social Interaction Social Interaction assist level: Interacts appropriately less than 25% of the time. May be withdrawn or combative.  Problem Solving Problem solving assist level: Solves basic less than 25% of the time - needs direction nearly all the time or does not effectively solve problems and may need a restraint for safety  Memory Memory assist level: Recognizes or recalls less than 25% of the time/requires cueing greater than 75% of the time   Medical Problem List and Plan: 1. 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 Substantial comparative cortical and cerebellar atrophy. Neuro reviewing also  -lengthy conference with all family members to review pertinent medical issues on 9/28. Over an hour was spent in discussion 2. DVT Prophylaxis/Anticoagulation: Pharmaceutical: Lovenox 3. Pain Management: legs are hypersensitive to touch which is something I noticed in the office with her last month too. Often  inhibits activity    -continue low dose gabapentin, adjusted to 100mg  BID to improve arousal 4. Mood: LCSW to follow for evaluation and support.  5. Neuropsych: This patient is notcapable of making decisions on herown behalf. 6. Skin/Wound Care: Air mattress overlay. Routine pressure relief measures.  -appreciate WOC RN recs  -given diflucan for yeast in vaginal/groin area 7. Fluids/Electrolytes/Nutrition:      -osmolite 1.5 at 85cc/hr currently, prostat BID as well---continue this at home, can shift to overnight schedule.   -prealbumin up to 28 most recently  -weights continue to trend up (don't believe today's weight)  -team working with family to ensure appropriate communication regarding diet/weights  Filed Weights   10/23/16 0613 10/23/16 0950 10/24/16 0530  Weight: 42.2 kg (93 lb) 41.9 kg (92 lb 6.4 oz) 38.6 kg (85 lb)    -BUN 20 on 9/27, continue current H20 flushes 8. MRSA tracheobronchitis: completed abx for this -  9. Hyponatremia:  improving  -at 138 9/27  -titrated free water to 200 mL 3 times a day as she's getting a little dry      10. Urinary retention: PVR's trending down into 100's generally  -I and O cath prn    - urecholine reduced to 10mg , but it appears she needs the 25mg ---will adjust today  -urine culture positive for pseudomonas--changed to cipro 9/22-9/29   -recheck ucx tomorrow  morning   11. HTN:  Trending up slightly. Increased to norvasc 10mg  daily    12. H/o constipation:   continue current regimen for now.  13. VDRF: Chest PT bid. ATC for humidification. Suction prn. HOB elevated, aspiration precautions   -follow up cxr ordered for tomorrow  -trach asp with MRSA again 14. Hypokalemia: Improved, potassium normal 15. Dyskinesias/tremors: mysoline   - 50mg  at 0700, 1200, and 1700.    -have decreased HS mysoline to decrease AM drowsiness    -valium: stopped, prn klonopin   -   adjustable hinged/locking left knee brace to be worn at  night    -wearing/tolerating so far and seems to be tolerating   -  improved ROM/mobility with therapy/transfers 16. Breast cancer with hx of lumpectomy  -Appreciate Dr. Virgie Dad follow up. Arimidex to resume once discharged home   LOS (Days) 24 A Seama T, MD 10/24/2016 8:24 AM

## 2016-10-25 ENCOUNTER — Inpatient Hospital Stay (HOSPITAL_COMMUNITY): Payer: Medicare Other | Admitting: Speech Pathology

## 2016-10-25 ENCOUNTER — Inpatient Hospital Stay (HOSPITAL_COMMUNITY): Payer: Medicare Other | Admitting: Physical Therapy

## 2016-10-25 ENCOUNTER — Inpatient Hospital Stay (HOSPITAL_COMMUNITY): Payer: Medicare Other

## 2016-10-25 ENCOUNTER — Inpatient Hospital Stay (HOSPITAL_COMMUNITY): Payer: Medicare Other | Admitting: Occupational Therapy

## 2016-10-25 LAB — GLUCOSE, CAPILLARY
GLUCOSE-CAPILLARY: 106 mg/dL — AB (ref 65–99)
GLUCOSE-CAPILLARY: 112 mg/dL — AB (ref 65–99)
GLUCOSE-CAPILLARY: 114 mg/dL — AB (ref 65–99)
GLUCOSE-CAPILLARY: 118 mg/dL — AB (ref 65–99)
GLUCOSE-CAPILLARY: 129 mg/dL — AB (ref 65–99)
GLUCOSE-CAPILLARY: 131 mg/dL — AB (ref 65–99)
GLUCOSE-CAPILLARY: 142 mg/dL — AB (ref 65–99)
Glucose-Capillary: 104 mg/dL — ABNORMAL HIGH (ref 65–99)
Glucose-Capillary: 131 mg/dL — ABNORMAL HIGH (ref 65–99)

## 2016-10-25 MED ORDER — AMLODIPINE BESYLATE 10 MG PO TABS
10.0000 mg | ORAL_TABLET | Freq: Every day | ORAL | Status: DC
  Administered 2016-10-26 – 2016-10-27 (×2): 10 mg
  Filled 2016-10-25 (×2): qty 1

## 2016-10-25 NOTE — Progress Notes (Signed)
Patient incontinent 3 times today of large amounts of urine. Attempted to toilet patient up on [facility] without success with [name]. Reviewed with caregiver, [name] rationale to observe and demonstrate how to in and out cath patient if needed at home. [name] reported patient has a Therapist, sports to come in to cath patient as needed. [name] left this pm prior to checking patient to cath. Patient incontinent of large amounts of urine this pm and post void residual 36cc. Continue with attempts to obtain UA. Patient trach plugged at approximately 1830. O2 sat at 99 % on room air. Continue with plan of care.  Mliss Sax

## 2016-10-25 NOTE — Progress Notes (Signed)
Physical Therapy Session Note  Patient Details  Name: Judy Spears MRN: [id number] Date of Birth: 24-Feb-1950  Today's Date: 10/25/2016 PT Individual Time: 1300-1400 PT Individual Time Calculation (min): 60 min   Short Term Goals: Week 3:  PT Short Term Goal 1 (Week 3): =LTG due to length of stay   Skilled Therapeutic Interventions/Progress Updates: Pt received seated in w/c, denies pain and no evidence of pain throughout session. Pt on room air with PMSV intact throughout session with vitals WNL. Transfer w/c <>mat table maxA squat pivot. R sidelying propped on elbow on yoga block for L trunk stretching. Therapist performed overpressure for R trunk flexion/L trunk lengthening 3x 1 min. Pt then able to demonstrate S sitting balance on edge of mat approx 30 sec before LOB anteriorly and to L side. Pt vocalizing to therapist; requires increased time but eventually able to discern pt saying she wanted to get back into her w/c. When asked what we would do once she was in the w/c, pt clearly says "I dunno". Therapist clarified that pt wasn't uncomfortable or needing to use restroom; ultimately returned to w/c for pt comfort. In supported sitting pt engaged in beach ball hits with BUE; demonstrates good activation and AROM BUEs, with LUE assisting more than R. Also demonstrates adequate depth perception and coordination to participate. LLE PROM to hamstrings with cues for deep breathing to reduce tremor/spasticity with good results, also helps when pt distracted by caregiver. Returned to room totalA: remained seated in w/c at end of session, all needs in reach.      Therapy Documentation Precautions:  Precautions Precautions: Fall Precaution Comments: Trach, PEG Restrictions Weight Bearing Restrictions: No   See Function Navigator for Current Functional Status.   Therapy/Group: Individual Therapy  Luberta Mutter 10/25/2016, 3:36 PM

## 2016-10-25 NOTE — Progress Notes (Signed)
Occupational Therapy Session Note  Patient Details  Name: Judy Spears MRN: [id number] Date of Birth: 1950/02/25  Today's Date: 10/25/2016 OT Individual Time: 9702-6378 OT Individual Time Calculation (min): 75 min    Short Term Goals: Week 4:  OT Short Term Goal 1 (Week 4): Cont towards goals of max A overall  Skilled Therapeutic Interventions/Progress Updates:    Pt seen for OT session focusing on ADL re-training, functional use and ROM of UEs and functional transfers. Pt in supine upon arrival with husband and personal care attendant present providing assist at bed level following incontinent episode. Pt rolled with max A +1 for care to be completed total A . She transferred to EOB max A +1 and completed squat pivot transfer into w/c. Set-up at w/c, pt able to assist with doffing gown, pulling each arm out of sleeve and pushing fabric down over arms. With set-up assist and occasional min A for stability at elbow, pt worked on washing face, chest, and arms. Assist provided for thoroughness. Pt demonstrating good iniiation skills as well as functional grasp and although very limited, ROM necessary to wash UB. With total A for set-up and VCs, pt able to assist with threading B UEs into shirt and bra. Completed functional transfers using STEDY to [facility] in simulation of home environment. Pt cont to require +2 assist for full toileting task (assist for balance as well as clothing management and managing parts of STEDY. With hips shifted entirely to R and pt with strong L lean, she was able to sit in STEDY with supervision initially, shifting to mod A when fatigued- same amount of assist for sitting on BSC. Required max A +1 sit<> stand in STEDY.  Pt left seated in w/c at end of session, personal care attendant present.  Discussed d/c planning and returning home throughout session.   Therapy Documentation Precautions:  Precautions Precautions: Fall Precaution Comments: Trach, PEG Restrictions Weight  Bearing Restrictions: No Pain:   No/ denies pain  See Function Navigator for Current Functional Status.   Therapy/Group: Individual Therapy  [name], Margart Zemanek C 10/25/2016, 7:07 AM

## 2016-10-25 NOTE — Progress Notes (Signed)
[facility] PHYSICAL MEDICINE & REHABILITATION     PROGRESS NOTE    Subjective/Complaints:  No problems overnight. Awake when I entered room. In no distress  ROS: Limited due to cognitive/behavioral    Objective: Vital Signs: Blood pressure (!) 154/101, pulse 87, temperature 98.2 F (36.8 C), temperature source Oral, resp. rate 16, height 5\' 2"  (1.575 m), weight 43.5 kg (96 lb), last menstrual period 12/01/2010, SpO2 99 %. No results found. No results for input(s): WBC, HGB, HCT, PLT in the last 72 hours. No results for input(s): NA, K, CL, GLUCOSE, BUN, CREATININE, CALCIUM in the last 72 hours.  Invalid input(s): CO CBG (last 3)   Recent Labs  10/23/16 2358 10/24/16 1150 10/25/16 0556  GLUCAP 125* 102* 129*    Wt Readings from Last 3 Encounters:  10/25/16 43.5 kg (96 lb)  09/30/16 44.2 kg (97 lb 6.4 oz)  07/14/16 46.5 kg (102 lb 9.6 oz)    Physical Exam:  Constitutional:    . No distress.  HENT:  Head: NCAT Mouth/Throat: Oropharynx is dry Eyes: PERRL Neck: trach clean and without drainage Cardiovascular: RRR without murmur. No JVD        .    Respiratory: no rhonchi or wheezes GI: Soft. Bowel sounds are normal. She exhibits no distension. There is no tenderness.   Musculoskeletal: no tenderness at rest Neuro:  More alert, quikcer to arouse today. Ongoing flexor tone in UE's with rhythmic movements /dyskinesias present but decreased.[name] LLE flexor tone--- PROM to -10-15 degrees. Wearing LUE brace.   extensor tone RLE.  Is sensitive to ROM particularly on LLE, but RLE is sensitive too.    Skin: a few scattered abrasions. some yeast around vaginal/pelvic area Psych: flat but cooperative  Assessment/Plan: 1. Functional and mobility deficits secondary to recent [place] encephalitis and subsequent pneumonia/bronchitis/respiratory sequelae which require 3+ hours per day of interdisciplinary therapy in a comprehensive inpatient rehab setting. Physiatrist is providing  close team supervision and 24 hour management of active medical problems listed below. Physiatrist and rehab team continue to assess barriers to discharge/monitor patient progress toward functional and medical goals.  Function:  Bathing Bathing position Bathing activity did not occur: Safety/medical concerns (total A bed level night bath) Position: Shower  Bathing parts   Body parts bathed by helper: Right arm, Left arm, Chest, Abdomen, Front perineal area, Buttocks, Right upper leg, Left upper leg, Right lower leg, Left lower leg, Back  Bathing assist Assist Level: 2 helpers      Upper Body Dressing/Undressing Upper body dressing   What is the patient wearing?: Pull over shirt/dress, Bra Bra - Perfomed by patient: Thread/unthread right bra strap, Thread/unthread left bra strap, Hook/unhook bra (pull down sports bra) Bra - Perfomed by helper: Thread/unthread right bra strap, Thread/unthread left bra strap, Hook/unhook bra (pull down sports bra) Pull over shirt/dress - Perfomed by patient: Thread/unthread right sleeve, Thread/unthread left sleeve, Put head through opening Pull over shirt/dress - Perfomed by helper: Thread/unthread right sleeve, Thread/unthread left sleeve, Put head through opening, Pull shirt over trunk        Upper body assist Assist Level: 2 helpers      Lower Body Dressing/Undressing Lower body dressing   What is the patient wearing?: Socks, Shoes, Pants       Pants- Performed by helper: Thread/unthread right pants leg, Thread/unthread left pants leg, Pull pants up/down, Fasten/unfasten pants       Socks - Performed by helper: Don/doff right sock, Don/doff left sock   Shoes -  Performed by helper: Don/doff right shoe, Don/doff left shoe, Fasten right, Fasten left          Lower body assist Assist for lower body dressing: 2 Helpers      Toileting Toileting Toileting activity did not occur: No continent bowel/bladder event   Toileting steps completed  by helper: Adjust clothing prior to toileting, Performs perineal hygiene, Adjust clothing after toileting Toileting Assistive Devices: Toilet aid  Toileting assist Assist level: Two helpers   Transfers Chair/bed transfer   Chair/bed transfer method: Squat pivot Chair/bed transfer assist level: Maximal assist (Pt 25 - 49%/lift and lower) Chair/bed transfer assistive device: Mechanical lift Mechanical lift: Stedy   Locomotion Ambulation Ambulation activity did not occur: Safety/medical Editor, commissioning activity did not occur: N/A Type: Manual (dependent in tilt in space chair )      Cognition Comprehension Comprehension assist level: Understands basic less than 25% of the time/ requires cueing >75% of the time  Expression Expression assist level: Expresses basis less than 25% of the time/requires cueing >75% of the time.  Social Interaction Social Interaction assist level: Interacts appropriately less than 25% of the time. May be withdrawn or combative.  Problem Solving Problem solving assist level: Solves basic less than 25% of the time - needs direction nearly all the time or does not effectively solve problems and may need a restraint for safety  Memory Memory assist level: Recognizes or recalls less than 25% of the time/requires cueing greater than 75% of the time   Medical Problem List and Plan: 1. 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 Substantial comparative cortical and cerebellar atrophy. Neuro reviewing also  -lengthy conference with all family members to review pertinent medical issues on 9/28.   2. DVT Prophylaxis/Anticoagulation: Pharmaceutical: Lovenox 3. Pain Management: legs are hypersensitive to touch which is something I noticed in  the office with her last month too. Often inhibits activity    -continue low dose gabapentin, adjusted to 100mg  BID to improve arousal 4. Mood: LCSW to follow for evaluation and support.  5. Neuropsych: This patient is notcapable of making decisions on herown behalf. 6. Skin/Wound Care: Air mattress overlay. Routine pressure relief measures.  -appreciate WOC RN recs  -given diflucan for yeast in vaginal/groin area 7. Fluids/Electrolytes/Nutrition:      -osmolite 1.5 at 85cc/hr currently, prostat BID as well---continue this at home, can shift to overnight schedule.   -prealbumin up to 28 most recently  -weights steadily increasing     Filed Weights   10/24/16 0530 10/24/16 1200 10/25/16 0430  Weight: 38.6 kg (85 lb) 42.5 kg (93 lb 11.2 oz) 43.5 kg (96 lb)    -BUN 20 on 9/27, continue current H20 flushes 8. MRSA tracheobronchitis: completed abx for this -  9. Hyponatremia:  improving  -at 138 9/27  -titrated free water back to 200 mL 3 times a day        10. Urinary retention: PVR's trending down into 100's generally  -I and O cath prn, family ed  - urecholine increased back to 25mg   -urine culture positive for pseudomonas--changed to cipro 9/22-9/29   -ucx ordered for today   11. HTN:  Trending up slightly. Increased to norvasc 10mg  daily    12. H/o constipation:  continue current regimen for now.  13. VDRF: Chest PT bid. ATC for humidification. Suction prn. HOB elevated, aspiration precautions   -follow up cxr ordered for tomorrow  -trach asp with MRSA again 14. Hypokalemia: Improved, potassium normal 15. Dyskinesias/tremors: mysoline   -continue mysoline 50mg  qid   -valium: stopped, prn klonopin   -   adjustable hinged/locking left knee brace to be worn at night    -wearing/tolerating so far and seems to be tolerating   -  improved ROM/mobility with therapy/transfers 16. Breast cancer with hx of lumpectomy  -Appreciate Dr. Virgie Dad follow up. Arimidex to resume once  discharged home   LOS (Days) 25 A [place] T, MD 10/25/2016 8:40 AM

## 2016-10-25 NOTE — Progress Notes (Signed)
Speech Language Pathology Daily Session Note  Patient Details  Name: Judy Spears MRN: [id number] Date of Birth: [birth year]  Today's Date: 10/25/2016 SLP Individual Time: 1100-1200 SLP Individual Time Calculation (min): 60 min  Short Term Goals: Week 4: SLP Short Term Goal 1 (Week 4): Pt will focus attention on task or speaker for 30 seconds with Max A multimodal cues.  SLP Short Term Goal 2 (Week 4): Given Max A multimodal cues, pt will increase speech intelligibility at the simple word level to ~25%. SLP Short Term Goal 3 (Week 4): Given Max A cues, pt with return demonstrate various blowing exercises to increase respiratory support for vocalizations.  SLP Short Term Goal 4 (Week 4): Given Max A multimodal cues and oral stimulation, pt will initiate swallow in 50% of trials.   Skilled Therapeutic Interventions: Skilled treatment session focused on communication. Upon arrival, patient's PMSV was already donned. Patient's voice had a wet vocal quality which impacted her overall intelligibility. With Max A verbal and visual cues, patient able to clear mucous/secretions with a strong/sustained "oooooooo" and independently initiated a swallow response after clearance. Patient named functional items at the word level with voicing in 100% of opportunities with Max A multimodal cues and voiced at the phrase level (3-4 words) while describing the function of the item in 50% of opportunities with Max A multimodal cues. Patient with decreased arousal intermittently throughout session and required Max A multimodal cues for sustained attention. Patient left upright in wheelchair with caregiver present. Continue with current plan of care.      Function:   Cognition Comprehension Comprehension assist level: Understands basic less than 25% of the time/ requires cueing >75% of the time  Expression Expression assistive device: Talk trach valve Expression assist level: Expresses basis less than 25% of the  time/requires cueing >75% of the time.  Social Interaction Social Interaction assist level: Interacts appropriately less than 25% of the time. May be withdrawn or combative.  Problem Solving Problem solving assist level: Solves basic less than 25% of the time - needs direction nearly all the time or does not effectively solve problems and may need a restraint for safety  Memory Memory assist level: Recognizes or recalls less than 25% of the time/requires cueing greater than 75% of the time    Pain Intermittent pain with movement, patient premedicated   Therapy/Group: Individual Therapy  Marshal Schrecengost 10/25/2016, 2:12 PM

## 2016-10-26 ENCOUNTER — Inpatient Hospital Stay (HOSPITAL_COMMUNITY): Payer: Medicare Other | Admitting: Physical Therapy

## 2016-10-26 ENCOUNTER — Inpatient Hospital Stay (HOSPITAL_COMMUNITY): Payer: Medicare Other | Admitting: Occupational Therapy

## 2016-10-26 ENCOUNTER — Inpatient Hospital Stay (HOSPITAL_COMMUNITY): Payer: Medicare Other | Admitting: Speech Pathology

## 2016-10-26 LAB — GLUCOSE, CAPILLARY
GLUCOSE-CAPILLARY: 155 mg/dL — AB (ref 65–99)
GLUCOSE-CAPILLARY: 137 mg/dL — AB (ref 65–99)
Glucose-Capillary: 125 mg/dL — ABNORMAL HIGH (ref 65–99)

## 2016-10-26 MED ORDER — MODAFINIL 100 MG PO TABS
ORAL_TABLET | ORAL | Status: AC
  Filled 2016-10-26: qty 1

## 2016-10-26 NOTE — Progress Notes (Signed)
Speech Language Pathology Discharge Summary  Patient Details  Name: Judy Spears MRN: [id number] Date of Birth: [birth year]  Today's Date: 10/26/2016 SLP Individual Time: 0935-1030 SLP Individual Time Calculation (min): 55 min   Skilled Therapeutic Interventions:  Pt was seen for skilled ST targeting speech and swallowing goals.  Pt's oral mucosa was very dry upon therapist's arrival and therapist provided moisture via lemon swabs and medline mouth moisturizer to facilitate swallow initiation of pt's saliva.  Pt initiated 8 swallows of her saliva during therapy session with max assist multimodal cues.  Pt's vocal quality remained wet throughout therapy session, even immediately following volitional swallows.  Pt needed max assist multimodal cues to increase vocal intensity and overarticulate to achieve intelligibility at the word level in 25% of opportunities.  Pt also needed max assist multimodal cues for cessation of perseverative vocalizations which further hindered functional communication.  Pt was handed off to PTA in wheelchair.  Pt is ready for discharge tomorrow.       Patient has met 3 of 3 long term goals.  Patient to discharge at overall Max level.  Reasons goals not met:     Clinical Impression/Discharge Summary:  Pt has made limited functional gains and has met 3 out of 3 long term goals.  Pt needs max assist multimodal cues for swallow initiation when swallowing her saliva.  Xerostomia, extensive neuro deficits, and cognition all appear to be factors impacting pt's ability to initiate a swallow.  Vocal quality is frequently wet due to decreased management of secretions resulting from swallowing deficits mentioned above.  Pt also requires max to total assist for functional communication due to deficits resulting from dysphonia, dysarthria, and cognitive impairment.  Pt is discharging home with 24/7 supervision from family and hired caregivers.  Education is complete at this time.    Care  Partner:  Caregiver Able to Provide Assistance: Yes  Type of Caregiver Assistance: Physical;Cognitive  Recommendation:  24 hour supervision/assistance;Outpatient SLP;Home Health SLP  Rationale for SLP Follow Up: Maximize functional communication;Maximize cognitive function and independence;Maximize swallowing safety;Reduce caregiver burden   Equipment: no new equipment recommended by SLP as trach is now capped, pt has PMSV available for communication long term   Reasons for discharge: Discharged from hospital   Patient/Family Agrees with Progress Made and Goals Achieved: Yes   Function:  Eating Eating   Modified Consistency Diet: Yes Eating Assist Level: Helper performs IV, parenteral or tube feed           Cognition Comprehension Comprehension assist level: Understands basic 25 - 49% of the time/ requires cueing 50 - 75% of the time  Expression   Expression assist level: Expresses basic 25 - 49% of the time/requires cueing 50 - 75% of the time. Uses single words/gestures.  Social Interaction Social Interaction assist level: Interacts appropriately 25 - 49% of time - Needs frequent redirection.  Problem Solving Problem solving assist level: Solves basic less than 25% of the time - needs direction nearly all the time or does not effectively solve problems and may need a restraint for safety  Memory Memory assist level: Recognizes or recalls 25 - 49% of the time/requires cueing 50 - 75% of the time   Windell Moulding L 10/26/2016, 11:40 AM

## 2016-10-26 NOTE — Progress Notes (Signed)
Occupational Therapy Discharge Summary  Patient Details  Name: Judy Spears MRN: [id number] Date of Birth: 1950/06/26   Patient has met 3 of 3 long term goals due to improved activity tolerance, improved balance, postural control, functional use of  RIGHT upper and LEFT upper extremity, improved attention and improved coordination.  Patient to discharge at [facility] Max Assist level.  Patient's care partner is independent to provide the necessary physical and cognitive assistance at discharge.  Pt's personal care attendant has been present and participatory in all OT tx sessions. She is aware of amount of physical assist pt requires and demonstrates willing and ableness to provide this level of assist at d/c as she has had to provide this level of assist at times PTA. Pt cont to be most limited by strong L lean limiting her functional sitting and standing abilities. She requires max A for all transfers (squat pivot, stand pivot or with use of STEDY). She requires close supervision up to mod A for static sitting balance on BSC.  Pt requires +2 assist for toileting and LB dressing tasks standing with max A +1 or in STEDY due to decreased functional standing balance. Despite reservations by therapist, Pt's caregivers are very adamant about showering pt at d/c. They have a roll in shower chair for home use. Despite recommendation, completed showering task x2 during pt's rehab admission in order to ensure safety and provide education for home showering task. Pt's caregiver and husband demonstrate good safety awareness and handling of pt for showering task.    Recommendation:  Patient will benefit from ongoing skilled OT services in outpatient setting to continue to advance functional skills in the area of BADL and Reduce care partner burden.  Equipment: Drop arm BSC  Reasons for discharge: treatment goals met and discharge from hospital  Patient/family agrees with progress made and goals achieved:  Yes  OT Discharge Precautions/Restrictions  Precautions Precautions: Fall Precaution Comments: PEG; capped trach Restrictions Weight Bearing Restrictions: No Vision Baseline Vision/History:  (Hx of visual deficits and pt has had reading glasses in the past, however, does not currently wear them) Patient Visual Report: Other (comment) (Unable to formally assess due to other deficits) Vision Assessment?: No apparent visual deficits Additional Comments: Unable to formally assess d/t cognitive/communication deficits; able to track to auditory stimuli with increased time, demonstrates adequate depth perception in functional tasks Praxis Praxis: Impaired Praxis Impairment Details: Initiation;Perseveration Cognition Overall Cognitive Status: Impaired/Different from baseline Arousal/Alertness: Awake/alert Attention: Focused Focused Attention: Impaired Focused Attention Impairment: Verbal basic;Functional basic Problem Solving: Impaired Problem Solving Impairment: Verbal basic;Functional basic Comments: Pt able to inconsistently answer basic questions with simple one word responses. When alert, can make basic needs known with increaesd time and trials Sensation Sensation Additional Comments: Improved hypersensitivity to L LE since admission, unable to formally assess beyond facial grimaces and vocalizations with light touch or PROM to LLE Coordination Gross Motor Movements are Fluid and Coordinated: No Fine Motor Movements are Fluid and Coordinated: No Coordination and Movement Description: Pt presents with spasticity and tremors in B UEs/LEs Heel Shin Test: unable Motor  Motor Motor: Abnormal tone;Abnormal postural alignment and control;Motor apraxia;Tetraplegia;Ataxia;Motor impersistence Motor - Discharge Observations: Resting/intention tremors in extremities x4 Mobility  Bed Mobility Bed Mobility: Sit to Supine;Supine to Sit Supine to Sit: 1: +1 Total assist;HOB elevated Supine to  Sit: Patient Percentage: 10% Sit to Supine: 2: Max assist;HOB elevated Sit to Supine - Details: Verbal cues for technique;Verbal cues for precautions/safety;Manual facilitation for weight shifting;Tactile cues for  placement;Tactile cues for posture Transfers Sit to Stand: 1: +1 Total assist Sit to Stand Details (indicate cue type and reason): UE over therapist's shoulder  Trunk/Postural Assessment  Cervical Assessment Cervical Assessment: Exceptions to [facility] (Forward head) Thoracic Assessment Thoracic Assessment: Exceptions to [facility] (L side bend/lean due to tone, unable to self correct/ maintain once positioned in midline) Lumbar Assessment Lumbar Assessment: Exceptions to [facility] (Posterior pelvic tilt) Postural Control Postural Control: Deficits on evaluation (Strong L lean in sitting and modified standing)  Balance Balance Balance Assessed: Yes Static Sitting Balance Static Sitting - Balance Support: Feet supported;Right upper extremity supported Static Sitting - Level of Assistance: 4: Min assist;3: Mod assist Static Sitting - Comment/# of Minutes: Close supervision-min A initially, however, can require up to mod-max due to L lean when fatigued Dynamic Sitting Balance Dynamic Sitting - Balance Support: During functional activity Dynamic Sitting - Level of Assistance: 4: Min assist;3: Mod assist Sitting balance - Comments: Min A initially, however, can require up to mod-max due to L lean when fatigued Static Standing Balance Static Standing - Balance Support: Bilateral upper extremity supported Static Standing - Level of Assistance: 1: +1 Total assist Extremity/Trunk Assessment RUE Strength RUE Overall Strength Comments: Pt can raise UE against gravity; can grasp items, however, unable to release items. Demonstrates 4/5 elbow flexion/extention, however, unable to use at functional level RUE Tone RUE Tone: Severe;Hypertonic LUE Assessment LUE Assessment: Exceptions to [facility] LUE  Strength LUE Overall Strength Comments: Unable to formally assess d/t tone and spasticity;demonstrates functional strength/ mobility, however, unable to initate and use at truely functional level LUE Tone LUE Tone: Severe;Hypertonic LUE Tone Comments: elbow contracture; unable to extend greater than 90 deg    See Function Navigator for Current Functional Status.  [name], Amy C 10/26/2016, 3:52 PM

## 2016-10-26 NOTE — Progress Notes (Signed)
Nutrition Follow-up  DOCUMENTATION CODES:   Severe malnutrition in context of chronic illness, Underweight  INTERVENTION:  Upon discharge home,  Continue Osmolite 1.5 formula via PEG at goal rate of 85 ml/hr x 20 hours (may hold tube feeds for up to 4 hours) with 30 ml Prostat BID (or equivalent) to provide 2750 kcal, 137 grams of protein, and 1272 ml of free water.  Continue free water flushes of 200 ml QID. Total free water: 2072 ml/day.  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 She was admitted on 09/24/16 with lethargy. SOB and low grade fevers due to MRSA tracheobronchitis v/s PNA. PEG tube evaluated by IVR and was exchanged out on 9/5. Patient has had significant decline in mobility and ability to carryout ADL tasks. CIR recommended by rehab team.  Pt has been tolerating her tube feeds. Weight has been stable. Plans for discharge tomorrow. Recommend continuing current tube feeding orders upon discharge. Discussed nutrition plans with PA, Pam, no plans for change current tube feeding to nocturnal feeds prior to discharge to avoid complications/aspiration.  Diet Order:  Diet NPO time specified  Skin:  Reviewed, no issues  Last BM:  10/1  Height:   Ht Readings from Last 1 Encounters:  09/30/16 '5\' 2"'$  (1.575 m)    Weight:   Wt Readings from Last 1 Encounters:  10/26/16 92  lb (41.7 kg)    Ideal Body Weight:  50 kg  BMI:  Body mass index is 16.83 kg/m.  Estimated Nutritional Needs:   Kcal:  1700-1900  Protein:  65-75 gram  Fluid:  1.7-1.9 L/day  EDUCATION NEEDS:   No education needs identified at this time  Corrin Parker, MS, RD, LDN Pager # [phone] After hours/ weekend pager # [phone]

## 2016-10-26 NOTE — Progress Notes (Signed)
[place] PHYSICAL MEDICINE & REHABILITATION     PROGRESS NOTE    Subjective/Complaints:  Awake in bed. Nods affirmatively that she slept well. Denies pain  ROS: Limited due to cognitive/behavioral   Objective: Vital Signs: Blood pressure 124/89, pulse 82, temperature 98.2 F (36.8 C), temperature source Axillary, resp. rate 17, height 5\' 2"  (1.575 m), weight 41.7 kg (92 lb), last menstrual period 12/01/2010, SpO2 100 %. Dg Chest 2 View  Result Date: 10/25/2016 CLINICAL DATA:  Fever. EXAM: CHEST  2 VIEW COMPARISON:  Radiograph of October 12, 2016. FINDINGS: The heart size and mediastinal contours are within normal limits. Tracheostomy is in grossly good position. No pneumothorax or pleural effusion is noted. Left lung is clear. Mild right basilar subsegmental atelectasis is noted. The visualized skeletal structures are unremarkable. IMPRESSION: Mild right basilar subsegmental atelectasis is noted which is improved compared to prior exam. Electronically Signed   By: Marijo Conception, M.D.   On: 10/25/2016 15:43   No results for input(s): WBC, HGB, HCT, PLT in the last 72 hours. No results for input(s): NA, K, CL, GLUCOSE, BUN, CREATININE, CALCIUM in the last 72 hours.  Invalid input(s): CO CBG (last 3)   Recent Labs  10/25/16 1750 10/25/16 2338 10/26/16 0601  GLUCAP 131* 118* 125*    Wt Readings from Last 3 Encounters:  10/26/16 41.7 kg (92 lb)  09/30/16 44.2 kg (97 lb 6.4 oz)  07/14/16 46.5 kg (102 lb 9.6 oz)    Physical Exam:  Constitutional:    . No distress.  HENT:  Head: NCAT Mouth/Throat: Oropharynx is dry Eyes: PERRL Neck: trach clean and without drainage Cardiovascular: RRR without murmur. No JVD         .    Respiratory: clear, no wheezes, no distress GI: Soft. Bowel sounds are normal. She exhibits no distension. There is no tenderness.   Musculoskeletal: no tenderness at rest Neuro:  More alert, quikcer to arouse today. Ongoing flexor tone in UE's with  rhythmic movements /dyskinesias present but decreased.[name] LLE flexor tone--- PROM to -10 degrees. Wearing LUE brace.   extensor tone RLE.  Legs less sensitive to touch  Skin: a few scattered abrasions. some yeast around vaginal/pelvic area Psych: flat but cooperative  Assessment/Plan: 1. Functional and mobility deficits secondary to recent [place] encephalitis and subsequent pneumonia/bronchitis/respiratory sequelae which require 3+ hours per day of interdisciplinary therapy in a comprehensive inpatient rehab setting. Physiatrist is providing close team supervision and 24 hour management of active medical problems listed below. Physiatrist and rehab team continue to assess barriers to discharge/monitor patient progress toward functional and medical goals.  Function:  Bathing Bathing position Bathing activity did not occur: Safety/medical concerns (total A bed level night bath) Position: Shower  Bathing parts   Body parts bathed by helper: Right arm, Left arm, Chest, Abdomen, Front perineal area, Buttocks, Right upper leg, Left upper leg, Right lower leg, Left lower leg, Back  Bathing assist Assist Level: 2 helpers      Upper Body Dressing/Undressing Upper body dressing   What is the patient wearing?: Pull over shirt/dress, Bra Bra - Perfomed by patient: Thread/unthread right bra strap, Thread/unthread left bra strap, Hook/unhook bra (pull down sports bra) Bra - Perfomed by helper: Thread/unthread right bra strap, Thread/unthread left bra strap, Hook/unhook bra (pull down sports bra) Pull over shirt/dress - Perfomed by patient: Thread/unthread right sleeve, Thread/unthread left sleeve, Put head through opening Pull over shirt/dress - Perfomed by helper: Thread/unthread right sleeve, Thread/unthread left sleeve, Put  head through opening, Pull shirt over trunk        Upper body assist Assist Level: 2 helpers      Lower Body Dressing/Undressing Lower body dressing   What is the patient  wearing?: Socks, Shoes, Pants       Pants- Performed by helper: Thread/unthread right pants leg, Thread/unthread left pants leg, Pull pants up/down, Fasten/unfasten pants       Socks - Performed by helper: Don/doff right sock, Don/doff left sock   Shoes - Performed by helper: Don/doff right shoe, Don/doff left shoe, Fasten right, Fasten left          Lower body assist Assist for lower body dressing: 2 Automotive engineer activity did not occur: No continent bowel/bladder event   Toileting steps completed by helper: Adjust clothing prior to toileting, Performs perineal hygiene, Adjust clothing after toileting Toileting Assistive Devices:  (in STEDY)  Toileting assist Assist level: Two helpers   Transfers Chair/bed transfer   Chair/bed transfer method: Squat pivot Chair/bed transfer assist level: Maximal assist (Pt 25 - 49%/lift and lower) Chair/bed transfer assistive device: Mechanical lift Mechanical lift: Stedy   Locomotion Ambulation Ambulation activity did not occur: Safety/medical Editor, commissioning activity did not occur: N/A Type: Manual (dependent in tilt in space chair )      Cognition Comprehension Comprehension assist level: Understands basic less than 25% of the time/ requires cueing >75% of the time  Expression Expression assist level: Expresses basis less than 25% of the time/requires cueing >75% of the time.  Social Interaction Social Interaction assist level: Interacts appropriately less than 25% of the time. May be withdrawn or combative.  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 DVT Prophylaxis/Anticoagulation: Pharmaceutical: Lovenox 3. Pain Management: legs are hypersensitive to touch which is something I noticed in the office with her last month too. Often inhibits activity    -continue low dose gabapentin, adjusted to 100mg  BID to improve arousal 4. Mood: LCSW to follow for evaluation and support.  5. Neuropsych: This patient is notcapable of making decisions on herown behalf. 6. Skin/Wound Care: Air mattress overlay. Routine pressure relief measures.  -appreciate WOC RN recs  -given diflucan for yeast in vaginal/groin area 7. Fluids/Electrolytes/Nutrition:      -osmolite 1.5 at 85cc/hr currently, prostat BID as well---continue this at home, can shift to overnight schedule.   -prealbumin up to 28 most recently  -weights steadily increasing despite day-to-day inconsistencies     Filed Weights   10/24/16 1200 10/25/16 0430 10/26/16 0402  Weight: 42.5 kg (93 lb 11.2 oz) 43.5 kg (96 lb) 41.7 kg (92 lb)    -BUN 20 on 9/27, continue current H20 flushes 8. MRSA tracheobronchitis: completed abx for this -  9. Hyponatremia:  improving  -at 138 9/27  -titrated free water back to 200 mL 3 times a day        10. Urinary retention: PVR's  trending down into 100's generally  -I and O cath prn, family ed  - urecholine increased back to 25mg  and pt voiding much more completely now   -need to find balance between overactive/underactive right  -urine culture positive for pseudomonas--changed to cipro 9/22-9/29   -ucx not obtained yet due to incontinence   11. HTN:  Trending up slightly. Increased to norvasc 10mg  daily    12. H/o constipation:   continue current regimen for now.  13. VDRF: Chest PT bid. ATC for humidification. Suction prn. HOB elevated, aspiration precautions    -follow up cxr ordered for tomorrow  -trach asp with MRSA again 14. Hypokalemia: Improved, potassium normal 15. Dyskinesias/tremors: mysoline   -continue mysoline 50mg  qid   -valium: stopped, prn klonopin   -   adjustable hinged/locking left knee brace to be worn at night    -wearing/tolerating so far and seems to be tolerating   -  improved ROM/mobility with therapy/transfers 16. Breast cancer with hx of lumpectomy  -Appreciate Dr. Virgie Dad follow up. Arimidex to resume once discharged home   LOS (Days) 26 A [place] T, MD 10/26/2016 8:47 AM

## 2016-10-26 NOTE — Progress Notes (Signed)
Physical Therapy Discharge Summary  Patient Details  Name: Judy Spears MRN: [id number] Date of Birth: April 03, 1950  Today's Date: 10/26/2016 PT Individual Time: 1300-1400 PT Individual Time Calculation (min): 60 min    Patient has met 3 of 3 long term goals due to improved activity tolerance, improved balance, improved postural control, increased strength and increased range of motion.  Patient to discharge at a wheelchair level Max Assist.   Patient's care partner is independent to provide the necessary physical and cognitive assistance at discharge.Patient's care partner is independent to provide the necessary physical and cognitive assistance at discharge.  Pt's personal care attendant has been present and participatory in all tx sessions. She is aware of amount of physical assist pt requires and demonstrates willing and ableness to provide this level of assist at d/c as she has had to provide this level of assist at times PTA. Pt's husband has also been present for majority of sessions and demonstrates willingness to provide level of assist needed, as well as work with other nursing staff who provides care when primary caregiver is not present on nights/weekends.    Reasons goals not met: All goals met  Recommendation:  Patient will benefit from ongoing skilled PT services in outpatient setting to continue to advance safe functional mobility, address ongoing impairments in coordination, strength, balance, ROM, activity tolerance, and minimize fall risk.  Equipment: No equipment provided  Reasons for discharge: treatment goals met and discharge from hospital  Patient/family agrees with progress made and goals achieved: Yes  PT Discharge Precautions/Restrictions Precautions Precautions: Fall Precaution Comments: PEG; capped trach Restrictions Weight Bearing Restrictions: No Vital Signs Therapy Vitals Temp: 98.2 F (36.8 C) Temp Source: Axillary Pulse Rate: 82 Resp: 17 BP:  124/89 Patient Position (if appropriate): Lying Oxygen Therapy SpO2: 100 % O2 Device: Tracheostomy Collar O2 Flow Rate (L/min): 5 L/min FiO2 (%): 28 % Vision/Perception  Vision - Assessment Additional Comments: Unable to formally assess due to cognitive and communication deficits Praxis Praxis: Intact  Cognition Overall Cognitive Status: Impaired/Different from baseline Arousal/Alertness: Awake/alert Attention: Focused Focused Attention: Impaired Focused Attention Impairment: Verbal basic;Functional basic Problem Solving: Impaired Problem Solving Impairment: Verbal basic;Functional basic Comments: Pt able to inconsistently answer basic questions with simple one word responses. When alert, can make basic needs known with increaesd time and trials Sensation Sensation Additional Comments: Improved hypersensitivity to L LE since admission, unable to formally assess beyond facial grimaces and vocalizations with light touch or PROM to LLE Coordination Gross Motor Movements are Fluid and Coordinated: No Fine Motor Movements are Fluid and Coordinated: No Coordination and Movement Description: Pt presents with spasticity and tremors in B UEs/LEs Heel Shin Test: unable Motor  Motor Motor: Abnormal tone;Abnormal postural alignment and control;Motor apraxia;Tetraplegia;Ataxia;Motor impersistence Motor - Discharge Observations: Resting/intention tremors in extremities x4  Mobility Bed Mobility Bed Mobility: Sit to Supine;Supine to Sit Supine to Sit: 1: +1 Total assist;HOB elevated Supine to Sit: Patient Percentage: 10% Sit to Supine: 2: Max assist;HOB elevated Sit to Supine - Details: Verbal cues for technique;Verbal cues for precautions/safety;Manual facilitation for weight shifting;Tactile cues for placement;Tactile cues for posture Transfers Transfers: Yes Sit to Stand: 1: +1 Total assist Sit to Stand Details (indicate cue type and reason): UE over therapist's shoulder Stand Pivot  Transfers: 2: Max assist Stand Pivot Transfer Details: Manual facilitation for weight shifting;Tactile cues for initiation;Verbal cues for sequencing Squat Pivot Transfers: 2: Max Teacher, English as a foreign language Transfer Details: Manual facilitation for weight shifting;Tactile cues for initiation;Verbal cues for sequencing  Locomotion  Ambulation Ambulation: No Gait Gait: No Stairs / Additional Locomotion Stairs: No Wheelchair Mobility Wheelchair Mobility: No  Trunk/Postural Assessment  Cervical Assessment Cervical Assessment: Exceptions to [facility] (Forward head, L lateral flexion) Thoracic Assessment Thoracic Assessment: Exceptions to [facility] (L side bend/lean due to tone, unable to self correct/ maintain once positioned in midline) Lumbar Assessment Lumbar Assessment: Exceptions to [facility] (Posterior pelvic tilt, L anterior pelvic rotation) Postural Control Postural Control: Deficits on evaluation (Strong L lean in sitting and modified standing)  Balance Balance Balance Assessed: Yes Static Sitting Balance Static Sitting - Balance Support: Feet supported;Right upper extremity supported Static Sitting - Level of Assistance: 4: Min assist;3: Mod assist Static Sitting - Comment/# of Minutes: Close supervision-min A initially, however, can require up to mod-max due to L lean when fatigued Dynamic Sitting Balance Dynamic Sitting - Balance Support: During functional activity Dynamic Sitting - Level of Assistance: 4: Min assist;3: Mod assist Sitting balance - Comments: Min A initially, however, can require up to mod-max due to L lean when fatigued Static Standing Balance Static Standing - Balance Support: Bilateral upper extremity supported Static Standing - Level of Assistance: 1: +1 Total assist Extremity Assessment  RUE Strength RUE Overall Strength Comments: Pt can raise UE against gravity; can grasp items, however, unable to release items. Demonstrates 4/5 elbow flexion/extention, however, unable to use  at functional level RUE Tone RUE Tone: Severe;Hypertonic LUE Assessment LUE Assessment: Exceptions to [facility] LUE Strength LUE Overall Strength Comments: Unable to formally assess d/t tone and spasticity;demonstrates functional strength/ mobility, however, unable to initate and use at truely functional level LUE Tone LUE Tone: Severe;Hypertonic LUE Tone Comments: elbow contracture; unable to extend greater than 90 deg  RLE Assessment RLE Assessment: Exceptions to [facility] RLE Tone RLE Tone: Severe;Hypertonic RLE Tone Comments: extensor tone  LLE Assessment LLE Assessment: Exceptions to WFL LLE PROM (degrees) Left Knee Extension: minus approx 15 degrees into full extension; painful with PROM LLE Tone LLE Tone: Severe;Hypertonic LLE Tone Comments: flexor synergy   Skilled Therapeutic Intervention: Pt received seated in w/c, denies pain and agreeable to treatment. Assessed mobility as described below, including x6 sit <>stands with maxA, LUE over therapist's shoulder. PROM to LLE knee extension. Pt declined attempts at standing in stedy. Pt attempting to vocalize throughout session, however pt with wet vocal quality and difficulty with clear speech, unable to effectively clear throat with max cues. Caregiver with no further questions/concerns regarding d/c home at this time. Remained seated in w/c at end of session, all needs in reach.    See Function Navigator for Current Functional Status.  Benjiman Core Tygielski 10/26/2016, 7:49 AM

## 2016-10-26 NOTE — Plan of Care (Signed)
Problem: RH Other (Specify) Goal: RH LTG Other (Specify)1 Outcome: Not Applicable Date Met: 20/60/15 Judy Spears is capped

## 2016-10-26 NOTE — Progress Notes (Signed)
Physical Therapy Session Note  Patient Details  Name: WINDSOR ZIRKELBACH MRN: [id number] Date of Birth: 26-Aug-1950  Today's Date: 10/26/2016 PT Individual Time: 1030-1100 PT Individual Time Calculation (min): 30 min   Short Term Goals: Week 3:  PT Short Term Goal 1 (Week 3): =LTG due to length of stay   Skilled Therapeutic Interventions/Progress Updates: Pt presented in [place] with [name] (caregiver) session focused on functional activities for d/c. Performed squat pivot and bed mobilty (rolling, sit to/from supine) all required max A (see chart). Performed sit to stand in [place] with increased time and pt requiring max cues and total assist. Pt returned to [place] chair and left with caregiver and husband with needs met.      Therapy Documentation Precautions:  Precautions Precautions: Fall Precaution Comments: Trach, PEG Restrictions Weight Bearing Restrictions: No   See Function Navigator for Current Functional Status.   Therapy/Group: Individual Therapy  Raylene Carmickle  Etola Mull, PTA  10/26/2016, 12:07 PM

## 2016-10-26 NOTE — Progress Notes (Signed)
Attempted to check pt's trach & place pt on Chest physiotherapy but the pt is currently working with PT off the floor.

## 2016-10-26 NOTE — Progress Notes (Signed)
Occupational Therapy Session Note  Patient Details  Name: Judy Spears MRN: [id number] Date of Birth: Aug 21, 1950  Today's Date: 10/26/2016 OT Individual Time: 1400-1500 OT Individual Time Calculation (min): 60 min    Short Term Goals: Week 4:  OT Short Term Goal 1 (Week 4): Cont towards goals of max A overall  Skilled Therapeutic Interventions/Progress Updates:    Pt seen for OT ADL bathing/dressing session. Pt sitting up in tilt-in-space w/c upon arrival with personal care attendant present. Pt agreeable to bathing at shower level today. Throughout session completed stand pivot transfers with max A +1. She bathed with total A on roll in shower chair, QRB used for safety and pillows used to facilitate midline sitting orientation due to pt's strong L lean. Total A +2 for bathing from seated position. She dressed in w/c, able to assist with threading of UEs into shirt and bra and stood with max A +1 while +2 assisted for clothing management. Upon standing, observed small amount of blood on seat, RN made aware of small wound on sacrum, nurse assessed. Pt left seated in tilt-in-space w/c at end of session with personal care attendant assisting with grooming tasks.   Therapy Documentation Precautions:  Precautions Precautions: Fall Precaution Comments: Trach, PEG Restrictions Weight Bearing Restrictions: No Pain:   No/ denies pain  See Function Navigator for Current Functional Status.   Therapy/Group: Individual Therapy  [name], Arzella Rehmann C 10/26/2016, 7:12 AM

## 2016-10-26 NOTE — Progress Notes (Signed)
Per RN - patient is in the shower. RT unable to complete 1200 trach check. RT will return for 1600 trach check.

## 2016-10-27 DIAGNOSIS — B965 Pseudomonas (aeruginosa) (mallei) (pseudomallei) as the cause of diseases classified elsewhere: Secondary | ICD-10-CM

## 2016-10-27 DIAGNOSIS — N390 Urinary tract infection, site not specified: Secondary | ICD-10-CM

## 2016-10-27 LAB — URINE CULTURE: Culture: NO GROWTH

## 2016-10-27 LAB — GLUCOSE, CAPILLARY
Glucose-Capillary: 129 mg/dL — ABNORMAL HIGH (ref 65–99)
Glucose-Capillary: 115 mg/dL — ABNORMAL HIGH (ref 65–99)

## 2016-10-27 MED ORDER — FREE WATER: 200.0000 mL | Freq: Four times a day (QID) | Status: DC

## 2016-10-27 MED ORDER — SACCHAROMYCES BOULARDII 250 MG PO CAPS: 250.0000 mg | ORAL_CAPSULE | Freq: Two times a day (BID) | ORAL | 0 refills | Status: DC

## 2016-10-27 MED ORDER — BETHANECHOL CHLORIDE 25 MG PO TABS: 25.0000 mg | ORAL_TABLET | Freq: Three times a day (TID) | ORAL | 0 refills | Status: DC

## 2016-10-27 MED ORDER — GABAPENTIN 100 MG PO CAPS: ORAL_CAPSULE | ORAL | 0 refills | Status: DC

## 2016-10-27 MED ORDER — FREE WATER: 200.0000 mL | Freq: Three times a day (TID) | Status: DC

## 2016-10-27 MED ORDER — AMLODIPINE BESYLATE 10 MG PO TABS
10.0000 mg | ORAL_TABLET | Freq: Every day | ORAL | 11 refills | Status: DC
Start: 2016-10-27 — End: 2017-07-05

## 2016-10-27 MED ORDER — PRO-STAT SUGAR FREE PO LIQD: 30.0000 mL | Freq: Two times a day (BID) | ORAL | 0 refills | Status: DC

## 2016-10-27 MED ORDER — AMLODIPINE 1 MG/ML ORAL SUSPENSION: 10.0000 mg | Freq: Every day | ORAL | 1 refills | Status: DC

## 2016-10-27 MED ORDER — AMLODIPINE BESYLATE 10 MG PO TABS: 10.0000 mg | ORAL_TABLET | Freq: Every day | ORAL | 11 refills | Status: DC

## 2016-10-27 MED ORDER — PRIMIDONE 50 MG PO TABS: 50.0000 mg | ORAL_TABLET | Freq: Four times a day (QID) | ORAL | 1 refills | Status: DC

## 2016-10-27 MED ORDER — CLOTRIMAZOLE 1 % EX CREA: TOPICAL_CREAM | Freq: Two times a day (BID) | CUTANEOUS | 0 refills | Status: DC

## 2016-10-27 NOTE — Discharge Summary (Signed)
Physician Discharge Summary  Patient ID: Judy Spears MRN: [id number] DOB/AGE: [birth year] 65 y.o.  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 EXAM: CHEST  2 VIEW COMPARISON:  Radiograph of October 12, 2016. FINDINGS: The heart size and mediastinal contours are within normal limits. Tracheostomy is in grossly good position. No pneumothorax or pleural effusion is noted. Left lung is clear. Mild right basilar subsegmental atelectasis is noted. The visualized skeletal structures are unremarkable. IMPRESSION: Mild right basilar subsegmental atelectasis is noted which is improved compared to prior exam. Electronically Signed   By: Marijo Conception, M.D.   On: 10/25/2016 15:43   Ct Head Wo Contrast  Result Date: 10/04/2016 CLINICAL DATA:  Altered level of consciousness. 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 Recent decline. EXAM: CT HEAD WITHOUT CONTRAST TECHNIQUE: Contiguous axial images were obtained from the base of the skull through the vertex without intravenous contrast. COMPARISON:  CT HEAD January 04, 2016 FINDINGS: Moderately motion degraded examination. BRAIN: No intraparenchymal hemorrhage, mass effect nor midline shift. Moderate ventriculomegaly with disproportionate ballooning of the frontal horns compatible with overlying volume loss. Advanced cerebellar atrophy. No acute large vascular territory infarcts. Patchy to confluent supratentorial white matter hypodensities. No abnormal extra-axial fluid collections. Basal cisterns are patent. VASCULAR:  Moderate calcific atherosclerosis of the carotid siphons. SKULL: No skull fracture. Severe LEFT temporomandibular osteoarthrosis. No significant scalp soft tissue swelling. SINUSES/ORBITS: The mastoid air-cells and included paranasal sinuses are well-aerated.The included ocular globes and orbital contents are non-suspicious. Status post bilateral ocular lens implants. OTHER: None. IMPRESSION: 1. No acute intracranial process on this moderately motion degraded examination. 2. Stable examination including advanced bifrontal atrophy seen with neuro degenerative disease. 3. Advanced cerebellar atrophy which can be secondary to anti seizure medication. 4. Moderate chronic small vessel ischemic disease. Electronically Signed   By: Elon Alas M.D.   On: 10/04/2016 22:09   Mr Brain Wo Contrast  Result Date: 10/09/2016 CLINICAL DATA:  Unexplained altered level of consciousness. History of encephalitis. EXAM: MRI HEAD WITHOUT CONTRAST TECHNIQUE: Multiplanar, multiecho pulse sequences of the brain and surrounding structures were obtained without intravenous contrast. COMPARISON:  Head CT 10/04/2016 FINDINGS: Brain: The study suffers from motion degradation. Diffusion imaging does not show any acute or subacute infarction. However, with this degree of motion, a small infarction could be inapparent. There is chronic cerebellar more than cerebral atrophy. Cerebral hemispheres show atrophy and chronic appearing small vessel ischemic changes of the white matter. No evidence of mass lesion, hydrocephalus or extra-axial collection. Scattered foci of susceptibility artifact are noted bilaterally. These are most consistent with foci of hemosiderin deposition secondary to distant insults. Vascular: Major vessels at the base of the brain show flow. Skull and upper cervical spine: Negative Sinuses/Orbits: Clear/normal Other: None significant IMPRESSION: Severely motion degraded exam. No acute infarction is seen, but sensitivity  is limited. Atrophy and moderate chronic appearing small vessel ischemic changes. Scattered foci of susceptibility artifact that could represent areas of hemosiderin deposition related to old micro hemorrhagic infarctions or other hemorrhagic brain insults. Electronically Signed   By: Nelson Chimes M.D.   On: 10/09/2016  15:30   Ir Replc Gastro/colonic Tube Percut W/fluoro  Result Date: 09/29/2016 INDICATION: Gastrostomy tube malfunction. EXAM: FLUOROSCOPIC GUIDED REPLACEMENT OF GASTROSTOMY TUBE COMPARISON:  None. MEDICATIONS: None. CONTRAST:  A total 20 cc Isovue-300 was administered into the gastric lumen FLUOROSCOPY TIME:  18 seconds (0.4 mGy) COMPLICATIONS: None immediate. PROCEDURE: The upper abdomen and external portion of the existing gastrostomy tube was prepped and draped in the usual sterile fashion, and a sterile drape was applied covering the operative field. Maximum barrier sterile technique with sterile gowns and gloves were used for the procedure. A timeout was performed prior to the initiation of the procedure. The existing gastrostomy tube was injected with a small amount of contrast confirming appropriate positioning within the gastric lumen. The existing gastrostomy tube was removed intact. Next, initially an 36 and ultimately a 63 [place] gastrostomy balloon retention gastrostomy tube was advanced through the gastrostomy tract. The balloon was inflated and disc was cinched. Contrast was injected and several spot fluoroscopic images were obtained in various obliquities to confirm appropriate intraluminal positioning. A dressing was placed. The patient tolerated the procedure well without immediate postprocedural complication. IMPRESSION: Successful fluoroscopic guided replacement of a new 22-French gastrostomy tube. The new gastrostomy tube is ready for immediate use. Electronically Signed   By: Sandi Mariscal M.D.   On: 09/29/2016 09:56   Dg Chest Port 1 View  Result Date: 10/12/2016 CLINICAL  DATA:  Increased tracheal secretions EXAM: PORTABLE CHEST 1 VIEW COMPARISON:  09/29/2016 FINDINGS: Tracheostomy tube in satisfactory position. Right perihilar linear band of airspace disease likely reflecting scarring. No focal consolidation, pleural effusion or pneumothorax. Stable cardiomediastinal silhouette. No acute osseous abnormality. IMPRESSION: No active disease. Electronically Signed   By: Kathreen Devoid   On: 10/12/2016 10:50   Dg Chest Port 1 View  Result Date: 09/29/2016 CLINICAL DATA:  Respiratory failure EXAM: PORTABLE CHEST 1 VIEW COMPARISON:  09/26/2016 chest radiograph. FINDINGS: Tracheostomy tube tip overlies the tracheal air column at the level of the thoracic inlet. Gastrostomy tube terminates over the proximal stomach. Stable cardiomediastinal silhouette with normal heart size. No pneumothorax. No pleural effusion. Stable patchy left retrocardiac opacity. Stable scarring versus atelectasis at the left costophrenic angle. No pulmonary edema. IMPRESSION: 1. Well-positioned support structures. 2. Stable patchy left retrocardiac opacity and mild left costophrenic angle scarring versus atelectasis. Electronically Signed   By: Ilona Sorrel M.D.   On: 09/29/2016 07:52    Labs:  Basic Metabolic Panel: BMP Latest Ref Rng & Units 10/21/2016 10/19/2016 10/15/2016  Glucose 65 - 99 mg/dL 110(H) 124(H) 135(H)  BUN 6 - 20 mg/dL 20 23(H) 13  Creatinine 0.44 - 1.00 mg/dL 0.35(L) 0.36(L) 0.37(L)  BUN/Creat Ratio 12 - 28 - - -  Sodium 135 - 145 mmol/L 138 138 135  Potassium 3.5 - 5.1 mmol/L 3.7 4.1 3.6  Chloride 101 - 111 mmol/L 97(L) 98(L) 93(L)  CO2 22 - 32 mmol/L 32 31 32  Calcium 8.9 - 10.3 mg/dL 9.1 9.6 9.2    CBC: CBC Latest Ref Rng & Units 10/19/2016 10/16/2016 10/01/2016  WBC 4.0 - 10.5 K/uL 7.4 7.8 7.5  Hemoglobin 12.0 - 15.0 g/dL 13.7 12.9 14.4  Hematocrit 36.0 - 46.0 % 42.6 41.0 42.8  Platelets 150 - 400 K/uL 383 413(H) 412(H)    CBG:  Recent Labs Lab 10/26/16 0601 10/26/16 1151  10/26/16 1836 10/26/16 2333 10/27/16 0525  GLUCAP 125* 155* 137* 115* 129*    Brief HPI:   Asencion Gowda Haganis a 66 y.o.femalewith history of  [place] encephalitis with complicated course, PEG/trach dependent, recent diagnosis of breast cancer s/p lumpectomy and started on anastrozole with resultant weakness and difficulty with ambulation. History taken from patient and daughters. Patient known to CIR from prior stay and was involved in outpatient therapy. She was admitted on 09/24/16 with lethargy. SOB and low grade fevers due to MRSA tracheobronchitis v/s PNA. She was started on IV vancomycin as well as chest PT. Respiratory status improving with treatment and foley placed due to urinary retention. PEG tube evaluated by IVR and was exchanged out on 9/5. Secretions are improving and PCCM recommends chest PT to help with mobilization of secretions. Progressive hyponatremia  noted and patient was requiring I/O caths due to urinary retention. She has had significant decline in mobility and ability to carryout ADL tasks. CIR recommended by rehab team   Hospital Course: YURIKO PORTALES was admitted to rehab 09/30/2016 for inpatient therapies to consist of PT, ST and OT at least three hours five days a week. Past admission physiatrist, therapy team and rehab RN have worked together to provide customized collaborative inpatient rehab. Respiratory status has been stable and IV vancomycin was changed to Septra DS from 9/10- 9/13.  Respiratory therapy has been following to manage CPT to help mobilize secretions. As endurance improved, she was started on button plugging during the day. hyponatremia has been followed closely and she was maintained on IVF till levels stabilized. She was started on water flushes via PEG as sodium levels were stable and this was briefly increased to qid due to pre-renal azotemia.  Blood pressures have been monitored on bid basis and Norvasc was titrated to 10 mg to better control. BLE  hypersensitivity has been inhibiting activity and low dose gabapentin was added to help manage symptoms.    Follow up CXR done on 9/18 due to increase in secretions and showed NAD.  She has continued to have urinary retention requiring I/O caths. Urecholine was added with some improvement but family instructed on cathing every 6 hour if patient has not voided. Pseudomonas UTI was treated with  7 day course of cipro and follow up UCS done on 10/2 showing no growth.  Tracheal secretions 9/27 show abundant MRSA felt to be colonization as patient has been afebrile and no respiratory symptoms noted.  Primidone was added to help manage dyskinesias/tremors and was slowly titrated upwards as she is tolerating this without SE. Hinged brace was ordered for left knee and is being used at nights to help with extension and improve mobility.    MRI brain done for work up of tremors and showed generalized atrophy and moderate small vessel disease due to ischemia. Dr. Leonie Man was consulted for input and felt that no significant progression or new findings noted. He felt that no further neurological workup indicated.  Dietitian has been following for input on nutritional needs and tube feeds were increased to 85 ml/hr for 20 hours per day with prosource bid to recent weight loss. Daily weights have shown improvement. Air mattress overlay has been used for pressure relief measures. Yeast overgrowh in peri area was treated with diflucan as well as lotrimin cream.  She has been making progress with therapy and is currently  max assist at wheelchair level. She will continue to receive outpatient PT, OT and ST at [facility] after discharge.    Rehab course: During patient's stay in rehab weekly team conferences were held to monitor patient's progress, set goals and discuss barriers to discharge. At admission,  patient required total assist with basic self care tasks and total assist with mobility. She was able to tolerate PMSV  for 45 minutes without distress. She was unable to vocalize and showed significant deficits in arousal, basic communication and interactions.  She  has had improvement in activity tolerance, balance, postural control and range of motion.  She is able to perform bed mobility and squat pivot transfers with max assist. She is able to perform sit to stand in Steady with increase time, max cues and total assist.  She is able to initiate swallow saliva with max assist and multimodal cues. She requires max multimodal cues to increase vocal intensity and over articulate at word level and for cessation of perseverative vocalizations when attempting functional communication.  Family education has been completed with husband and caregiver regarding all aspects of care and mobility.     Disposition: 01-Home or Self Care  Diet: Nothing by  Mouth.   Special Instructions: 1. Toilet every 3 hours and cath if no void or incontinent in 6 hours.  2.  Water flushes : 200 cc four times a day.  3.  Tube feeds are 85 cc/hr for 20 hours per day for nutritional needs. You can adjust timing --hold for therapy.  4. Recheck BMET/CBC in a week to monitor renal status and sodium levels.    Discharge Instructions    Ambulatory referral to Physical Medicine Rehab    Complete by:  As directed    1-2 weeks transitional care appt     Discharge Medication List as of 10/27/2016 11:07 AM    START taking these medications   Details  Amino Acids-Protein Hydrolys (FEEDING SUPPLEMENT, PRO-STAT SUGAR FREE 64,) LIQD Place 30 mLs into feeding tube 2 (two) times daily., Starting Wed 10/27/2016, Normal    bethanechol (URECHOLINE) 25 MG tablet Place 1 tablet (25 mg total) into feeding tube 3 (three) times daily., Starting Wed 10/27/2016, Normal    clotrimazole (LOTRIMIN) 1 % cream Apply topically 2 (two) times daily., Starting Wed 10/27/2016, Normal    gabapentin (NEURONTIN) 100 MG capsule One capsule--open and administer via PEG twice  a day., Normal    primidone (MYSOLINE) 50 MG tablet Place 1 tablet (50 mg total) into feeding tube 4 (four) times daily., Starting Wed 10/27/2016, Normal    saccharomyces boulardii (FLORASTOR) 250 MG capsule Place 1 capsule (250 mg total) into feeding tube 2 (two) times daily., Starting Wed 10/27/2016, Normal    amLODipine (NORVASC) 10 MG tablet Take 1 tablet (10 mg total) by mouth daily., Starting Wed 10/27/2016, Until Thu 10/27/2017, Normal      CONTINUE these medications which have CHANGED   Details  Water For Irrigation, Sterile (FREE WATER) SOLN Place 200 mLs into feeding tube every 8 (eight) hours., Starting Wed 10/27/2016, No Print      CONTINUE these medications which have NOT CHANGED   Details  amantadine (SYMMETREL) 50 MG/5ML solution Place 10 mLs (100 mg total) into feeding tube 2 (two) times daily. In am and at noon, Starting Wed 09/08/2016, No Print    atorvastatin (LIPITOR) 20 MG tablet Place 20 mg into feeding tube at bedtime. , Historical Med    baclofen (LIORESAL) 10 mg/mL SUSP Place 1 mL (10 mg total) into feeding tube 4 (four) times daily., Starting Thu 09/30/2016, No Print    bisacodyl (DULCOLAX) 10 MG suppository Place 1 suppository (10 mg total) rectally daily as needed for moderate constipation., Starting Thu 09/30/2016, No Print    chlorhexidine gluconate,  MEDLINE KIT, (PERIDEX) 0.12 % solution 15 mLs by Mouth Rinse route 2 (two) times daily., Starting Sun 01/04/2016, No Print    guaiFENesin (ROBITUSSIN) 100 MG/5ML SOLN Place 10 mLs (200 mg total) into feeding tube 4 (four) times daily., Starting Wed 02/25/2016, Normal    loratadine (CLARITIN) 10 MG tablet Place 1 tablet (10 mg total) into feeding tube daily as needed for allergies., Starting Wed 02/25/2016, No Print    Melatonin 5 MG CAPS Place 5 mg into feeding tube every evening. , Historical Med    Multiple Vitamin (MULTIVITAMIN) LIQD Place 15 mLs into feeding tube daily., Starting Tue 05/11/2016, Print    Nutritional  Supplements (FEEDING SUPPLEMENT, OSMOLITE 1.5 CAL,) LIQD Place 1,000 mLs into feeding tube daily., Starting Thu 09/30/2016, No Print    potassium chloride 20 MEQ/15ML (10%) SOLN Place 15 mLs (20 mEq total) into feeding tube daily., Starting Wed 05/05/2016, Normal      STOP taking these medications     amLODipine (NORVASC) 1 mg/mL SUSP oral suspension      Coenzyme Q10 (COQ10 PO)      CRANBERRY JUICE EXTRACT PO      diazepam (VALIUM) 2 MG tablet      docusate (COLACE) 60 MG/15ML syrup      doxycycline (VIBRAMYCIN) 25 MG/5ML SUSR      famotidine (PEPCID) 20 MG tablet      fluticasone (FLONASE) 50 MCG/ACT nasal spray      Probiotic Product (PROBIOTIC ADVANCED) CAPS      prochlorperazine (COMPAZINE) 5 MG tablet      simethicone (MYLICON) 40 NG/7.6BO drops      UNABLE TO FIND      valACYclovir (VALTREX) 500 MG tablet        Follow-up Information    Cari Caraway, MD Follow up on 11/02/2016.   Specialty:  Family Medicine Why:  @ 12:30 pm (hospital follow up appt) Contact information: [place] [address] [phone]        Meredith Staggers, MD Follow up.   Specialty:  Physical Medicine and Rehabilitation Why:  office will call you for follow up appointment Contact information: [place] [address] [phone]        Juanito Doom, MD Follow up on 11/10/2016.   Specialty:  Pulmonary Disease Why:  Be there at 9:15 am for hospital follow up.  Contact information: [place] [address] [phone]           Signed: Bary Leriche 10/27/2016, 12:41 PM

## 2016-10-27 NOTE — Progress Notes (Addendum)
[facility] PHYSICAL MEDICINE & REHABILITATION     PROGRESS NOTE    Subjective/Complaints:  Awake, a little restless today. Trach plugged, moaning occasionally. Nods her head that she's comforable  ROS: Limited due to cognitive/behavioral   Objective: Vital Signs: Blood pressure (!) 142/90, pulse 80, temperature 99.3 F (37.4 C), temperature source Axillary, resp. rate 18, height 5\' 2"  (1.575 m), weight 41.6 kg (91 lb 11.8 oz), last menstrual period 12/01/2010, SpO2 97 %. Dg Chest 2 View  Result Date: 10/25/2016 CLINICAL DATA:  Fever. EXAM: CHEST  2 VIEW COMPARISON:  Radiograph of October 12, 2016. FINDINGS: The heart size and mediastinal contours are within normal limits. Tracheostomy is in grossly good position. No pneumothorax or pleural effusion is noted. Left lung is clear. Mild right basilar subsegmental atelectasis is noted. The visualized skeletal structures are unremarkable. IMPRESSION: Mild right basilar subsegmental atelectasis is noted which is improved compared to prior exam. Electronically Signed   By: Marijo Conception, M.D.   On: 10/25/2016 15:43   No results for input(s): WBC, HGB, HCT, PLT in the last 72 hours. No results for input(s): NA, K, CL, GLUCOSE, BUN, CREATININE, CALCIUM in the last 72 hours.  Invalid input(s): CO CBG (last 3)   Recent Labs  10/26/16 1151 10/26/16 1836 10/27/16 0525  GLUCAP 155* 137* 129*    Wt Readings from Last 3 Encounters:  10/27/16 41.6 kg (91 lb 11.8 oz)  09/30/16 44.2 kg (97 lb 6.4 oz)  07/14/16 46.5 kg (102 lb 9.6 oz)    Physical Exam:  Constitutional:    . No distress.  HENT:  Head: NCAT Mouth/Throat: Oropharynx is dry Eyes: PERRL Neck: trach clean and without drainage Cardiovascular: RRR without murmur. No JVD          .    Respiratory: no distress. No wheezes or rhonchi.  GI: Soft. Bowel sounds are normal. She exhibits no distension. There is no tenderness.   Musculoskeletal: no tenderness at rest Neuro:   Alert, ongoing dyskinesias  Skin: a few scattered abrasions. some yeast around vaginal/pelvic area Psych: flat but cooperative  Assessment/Plan: 1. Functional and mobility deficits secondary to recent [place] encephalitis and subsequent pneumonia/bronchitis/respiratory sequelae which require 3+ hours per day of interdisciplinary therapy in a comprehensive inpatient rehab setting. Physiatrist is providing close team supervision and 24 hour management of active medical problems listed below. Physiatrist and rehab team continue to assess barriers to discharge/monitor patient progress toward functional and medical goals.  Function:  Bathing Bathing position Bathing activity did not occur: Safety/medical concerns (total A bed level night bath) Position: Shower  Bathing parts   Body parts bathed by helper: Right arm, Left arm, Chest, Abdomen, Front perineal area, Buttocks, Right upper leg, Left upper leg, Right lower leg, Left lower leg, Back  Bathing assist Assist Level: 2 helpers      Upper Body Dressing/Undressing Upper body dressing   What is the patient wearing?: Pull over shirt/dress, Bra Bra - Perfomed by patient: Thread/unthread right bra strap, Thread/unthread left bra strap Bra - Perfomed by helper: Hook/unhook bra (pull down sports bra) Pull over shirt/dress - Perfomed by patient: Thread/unthread right sleeve, Thread/unthread left sleeve Pull over shirt/dress - Perfomed by helper: Put head through opening, Pull shirt over trunk        Upper body assist Assist Level: 2 helpers      Lower Body Dressing/Undressing Lower body dressing   What is the patient wearing?: Socks, Shoes, Pants  Pants- Performed by helper: Thread/unthread right pants leg, Thread/unthread left pants leg, Pull pants up/down, Fasten/unfasten pants       Socks - Performed by helper: Don/doff right sock, Don/doff left sock   Shoes - Performed by helper: Don/doff right shoe, Don/doff left shoe,  Fasten right, Fasten left          Lower body assist Assist for lower body dressing: 2 Automotive engineer activity did not occur: No continent bowel/bladder event   Toileting steps completed by helper: Adjust clothing prior to toileting, Performs perineal hygiene, Adjust clothing after toileting Toileting Assistive Devices:  (in STEDY)  Toileting assist Assist level: Two helpers   Transfers Chair/bed transfer   Chair/bed transfer method: Squat pivot Chair/bed transfer assist level: Maximal assist (Pt 25 - 49%/lift and lower) Chair/bed transfer assistive device: Mechanical lift Mechanical lift: Stedy   Locomotion Ambulation Ambulation activity did not occur: Safety/medical Editor, commissioning activity did not occur: N/A (dependent in tilt in space w/c) Type: Manual      Cognition Comprehension Comprehension assist level: Understands basic 25 - 49% of the time/ requires cueing 50 - 75% of the time  Expression Expression assist level: Expresses basic 25 - 49% of the time/requires cueing 50 - 75% of the time. Uses single words/gestures.  Social Interaction Social Interaction assist level: Interacts appropriately 25 - 49% of time - Needs frequent redirection.  Problem Solving Problem solving assist level: Solves basic less than 25% of the time - needs direction nearly all the time or does not effectively solve problems and may need a restraint for safety  Memory Memory assist level: Recognizes or recalls 25 - 49% of the time/requires cueing 50 - 75% of the time   Medical Problem List and Plan: 1. 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  2. DVT Prophylaxis/Anticoagulation: Pharmaceutical: Lovenox 3. Pain Management: legs are  hypersensitive to touch which is something I noticed in the office with her last month too. Often inhibits activity    -continue low dose gabapentin, adjusted to 100mg  BID to improve arousal 4. Mood: LCSW to follow for evaluation and support.  5. Neuropsych: This patient is notcapable of making decisions on herown behalf. 6. Skin/Wound Care: Air mattress overlay. Routine pressure relief measures.  -appreciate WOC RN recs  -given diflucan for yeast in vaginal/groin area 7. Fluids/Electrolytes/Nutrition:      -osmolite 1.5 at 85cc/hr currently, prostat BID as well--  -continue rate/duration 20hrs at home---HS schedule  -prealbumin up to 28 most recently  -weights steadily increasing despite day-to-day inconsistencies     Filed Weights   10/25/16 0430 10/26/16 0402 10/27/16 0554  Weight: 43.5 kg (96 lb) 41.7 kg (92 lb) 41.6 kg (91 lb 11.8 oz)    -BUN 20 on 9/27, continue current H20 flushes 8. MRSA tracheobronchitis: completed abx for this -  9. Hyponatremia:  improving  -at 138 9/27  -titrated free water back to 200 mL 3 times a day        10. Urinary retention: PVR's trending down into 100's generally  -I and O cath prn, family ed  - continue urecholine 25mg  at discharge   -need to find balance between overactive/underactive right  -urine culture positive for pseudomonas--changed to cipro  9/22-9/29   -repeat ucx pending   11. HTN:  Trending up slightly. Increased to norvasc 10mg  daily    12. H/o constipation:   continue current regimen for now.  13. VDRF: Chest PT bid. ATC for humidification. Suction prn. HOB elevated, aspiration precautions   -follow up cxr with improvement but persistent atelectasis. She will remain high risk for aspiration due to her insp/exp capacity and dyskinesias  -flutter valve, breathing exercises, chest PT, and postural drainage have failed to consistently be effective in mobilizing her secretions.   -would benefit from afflovest at home to help  prevent further complications  -trach asp with MRSA again 14. Hypokalemia: Improved, potassium normal 15. Dyskinesias/tremors: mysoline   -continue mysoline 50mg  qid   -valium: stopped, prn klonopin   -   adjustable hinged/locking left knee brace to be worn at night    -wearing/tolerating so far and seems to be tolerating   -  improved ROM/mobility with therapy/transfers 16. Breast cancer with hx of lumpectomy  -Appreciate Dr. Virgie Dad follow up. Arimidex to resume once discharged home   LOS (Days) 27 A [place] T, MD 10/27/2016 8:40 AM

## 2016-10-27 NOTE — Patient Care Conference (Signed)
Inpatient RehabilitationTeam Conference and Plan of Care Update Date: 10/26/2016   Time: 2:00 PM    Patient Name: Judy Spears      Medical Record Number: [id number]  Date of Birth: 25-Oct-1950 Sex: Female         Room/Bed: 4W08C/4W08C-01 Payor Info: Payor: [employer] park [employer] MEDICARE / Plan: [facility] MEDICARE / Product Type: *No Product type* /    Admitting Diagnosis: fever constipation  Admit Date/Time:  09/30/2016  5:29 PM Admission Comments: No comment available   Primary Diagnosis:  Spastic tetraplegia ([place]) Principal Problem: Spastic tetraplegia ([place])  Patient Active Problem List   Diagnosis Date Noted  . Pseudomonas urinary tract infection 10/27/2016  . Severe muscle deconditioning 09/30/2016  . Pressure injury of skin 09/28/2016  . Respiratory failure ([place])   . Tracheobronchitis   . Urinary retention   . PEG (percutaneous endoscopic gastrostomy) status ([facility])   . Benign essential HTN   . Hyponatremia   . Hypokalemia   . Reactive hypertension   . Respiratory insufficiency 09/24/2016  . Malignant neoplasm of upper-inner quadrant of left breast in female, estrogen receptor positive ([place]) 07/14/2016  . Heterotopic ossification of bone 03/10/2016  . Acute on chronic respiratory failure with hypoxia ([place])   . Acute hypoxemic respiratory failure ([place])   . Acute blood loss anemia   . Atelectasis 02/04/2016  . Debility 02/03/2016  . Pneumonia of both lower lobes due to Pseudomonas species ([place])   . Pneumonia of both lower lobes due to methicillin susceptible Staphylococcus aureus (MSSA) ([place])   . Acute respiratory failure with hypoxia ([place])   . Acute encephalopathy   . Seizures ([place])   . Sepsis ([place])   . Hypoxia   . Pain   . HCAP (healthcare-associated pneumonia) 12/28/2015  . Chronic respiratory failure ([place]) 12/28/2015  . Acute tracheobronchitis 12/24/2015  . Tracheostomy tube present ([place])   . Tracheal stenosis   . Chronic respiratory failure with hypoxia ([place])   . Spastic  tetraplegia ([place]) 10/20/2015  . Tracheostomy status ([place]) 09/26/2015  . Allergic rhinitis 09/26/2015  . 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 Movement disorder 09/22/2015  . Encephalitis 09/22/2015  . Chest pain 02/07/2014  . Premature atrial contractions 01/13/2014  . Abnormality of gait 12/04/2013  . Hyperlipidemia 12/21/2011  . Cardiovascular risk factor 12/21/2011  . Bunion 01/31/2008  . Metatarsalgia of both feet 09/20/2007  . FLAT FOOT 09/20/2007  . HALLUX RIGIDUS, ACQUIRED 09/20/2007    Expected Discharge Date: Expected Discharge Date: 10/27/16  Team Members Present: Physician leading conference: Dr. Alger Simons Social Worker Present: Lennart Pall, LCSW Nurse Present: Dorien Chihuahua, RN PT Present: Kem Parkinson, PT OT Present: Roanna Epley, [place], OT SLP Present: Windell Moulding, SLP PPS Coordinator present : Daiva Nakayama, RN, CRRN     Current Status/Progress Goal Weekly Team Focus  Medical   improving weight/less tremor but persistent. uti treated. still retaining urine though  stablize medically for dischareg  nutrition, see above   Bowel/Bladder   Incontinent of bowel/bladder, I&O cath. LBM 10/25/16  Total assist  Timed toileting   Swallow/Nutrition/ Hydration   Total A for swallow initiation, PEG   Max A for swallow iniitation with thermal stimulation  Family education    ADL's             Mobility   maxA bed mobility, transfers, min sitting balance  max assist + 1 transfers and bed mobility   transfers, sitting balance, ROM, activity tolerance   Communication   PMSV with  full supervision, Max-Total A for vocing and overall speech intelligibility   Max A   Family Education    Safety/Cognition/ Behavioral Observations  Total A   Max A   Family Education    Pain   No s/s of pain noted  <2  Assess and treat pain q hsift and as needed   Skin   MASD to buttoks and groin, lotrimin cream, vaginal area slightly swollen, skintear left  elbow.  No new skin breakdown/infection with mod  assist  continue treatments as ordered, assess skin q shift as needed    Rehab Goals Patient on target to meet rehab goals: Yes *See Care Plan and progress notes for long and short-term goals.     Barriers to Discharge  Current Status/Progress Possible Resolutions Date Resolved   Physician    Medical stability;Neurogenic Bowel & Bladder        education/training for home      Nursing                  PT                    OT                  SLP                SW                Discharge Planning/Teaching Needs:  To return home with famiy and private duty caregiver resuming 24/7 assistance.      Team Discussion:  TEaching being completed with family and caregiver.  Trach remains capped.  Team feels ready for d/c tomorrow.  Revisions to Treatment Plan:  None    Continued Need for Acute Rehabilitation Level of Care: The patient requires daily medical management by a physician with specialized training in physical medicine and rehabilitation for the following conditions: Daily direction of a multidisciplinary physical rehabilitation program to ensure safe treatment while eliciting the highest outcome that is of practical value to the patient.: Yes Daily medical management of patient stability for increased activity during participation in an intensive rehabilitation regime.: Yes Daily analysis of laboratory values and/or radiology reports with any subsequent need for medication adjustment of medical intervention for : Pulmonary problems;Urological problems  Judy Spears 10/28/2016, 9:30 AM

## 2016-10-27 NOTE — Discharge Instructions (Signed)
Inpatient Rehab Discharge Instructions  Judy Spears Discharge date and time: 10/27/16   Activities/Precautions/ Functional Status: Activity:  As tolerated with assistance Diet: Nothing by mouth. Perform oral care 4- 5 times a day. Wound Care: Keep PEG site/ Trach site clean and dry.    Functional status:  ___ No restrictions     ___ Walk up steps independently _X__ 24/7 supervision/assistance   ___ Walk up steps with assistance ___ Intermittent supervision/assistance  ___ Bathe/dress independently ___ Walk with walker     _X__ Bathe/dress with assistance ___ Walk Independently    ___ Shower independently ___ Walk with assistance    ___ Shower with assistance _X__ No alcohol     ___ Return to work/school ________   Special Instructions: 1. Toilet every 3 hours and cath if no void or incontinent in 6 hours.  2.  Water flushes : 200 cc four times a day.  3.  Tube feeds are 85 cc/hr for 20 hours per day for nutritional needs. You can adjust timing --hold for therapy.     COMMUNITY REFERRALS UPON DISCHARGE:    Outpatient: PT     OT    ST                   Agency:  [facility]     Phone: [phone]                Appointment Date/Time:  10/4   @ 1:15 (PT) - please arrive at 1:00                                                                  10/8   @ 1:15 (OT)                                                                  10/10 @ 2:45 (ST)  Medical Equipment/Items Ordered:  Drop arm commode,  Pulmonary PT vest                                                      Agency/Supplier:  [employer] @ [phone]    My questions have been answered and I understand these instructions. I will adhere to these goals and the provided educational materials after my discharge from the hospital.  Patient/Caregiver Signature _______________________________ Date __________  Clinician Signature _______________________________________ Date __________  Please bring this form  and your medication list with you to all your follow-up doctor's appointments.

## 2016-10-27 NOTE — Progress Notes (Signed)
D/c to home, accompanied by husband. D/c inst given to family and caregiver by Algis Liming social work reviewed. F/u and home care. Belongings sent down in cart with pt Judy Spears, SN

## 2016-10-28 ENCOUNTER — Ambulatory Visit: Payer: Medicare Other | Attending: Physical Medicine & Rehabilitation | Admitting: Physical Therapy

## 2016-10-28 DIAGNOSIS — M25612 Stiffness of left shoulder, not elsewhere classified: Secondary | ICD-10-CM | POA: Diagnosis present

## 2016-10-28 DIAGNOSIS — R41841 Cognitive communication deficit: Secondary | ICD-10-CM | POA: Insufficient documentation

## 2016-10-28 DIAGNOSIS — R1312 Dysphagia, oropharyngeal phase: Secondary | ICD-10-CM | POA: Diagnosis present

## 2016-10-28 DIAGNOSIS — R278 Other lack of coordination: Secondary | ICD-10-CM

## 2016-10-28 DIAGNOSIS — R29818 Other symptoms and signs involving the nervous system: Secondary | ICD-10-CM

## 2016-10-28 DIAGNOSIS — R258 Other abnormal involuntary movements: Secondary | ICD-10-CM

## 2016-10-28 DIAGNOSIS — M6281 Muscle weakness (generalized): Secondary | ICD-10-CM | POA: Diagnosis present

## 2016-10-28 DIAGNOSIS — R2689 Other abnormalities of gait and mobility: Secondary | ICD-10-CM

## 2016-10-28 DIAGNOSIS — R293 Abnormal posture: Secondary | ICD-10-CM

## 2016-10-28 DIAGNOSIS — R471 Dysarthria and anarthria: Secondary | ICD-10-CM | POA: Insufficient documentation

## 2016-10-28 DIAGNOSIS — R41844 Frontal lobe and executive function deficit: Secondary | ICD-10-CM | POA: Diagnosis present

## 2016-10-28 DIAGNOSIS — M25622 Stiffness of left elbow, not elsewhere classified: Secondary | ICD-10-CM | POA: Diagnosis present

## 2016-10-28 DIAGNOSIS — M25611 Stiffness of right shoulder, not elsewhere classified: Secondary | ICD-10-CM | POA: Insufficient documentation

## 2016-10-28 NOTE — Therapy (Signed)
[facility] [address] [place], [place], [address] Phone: [phone]   Fax:  [phone]  Physical Therapy Evaluation  Patient Details  Name: Judy Spears MRN: [id number] Date of Birth: 09-14-1950 Referring Provider: Alger Simons, MD  Encounter Date: 10/28/2016      PT End of Session - 10/28/16 1910    Visit Number 1   Number of Visits 20   Date for PT Re-Evaluation 12/27/16   Authorization Type [employer] MCR   PT Start Time 1316   PT Stop Time 1405   PT Time Calculation (min) 49 min      Past Medical History:  Diagnosis Date  . Arthritis    lt great toe  . Complication of anesthesia    pt has had headaches post op-did advise to hydra well preop  . Hair loss    right-sided  . History of exercise stress test    a. ETT (12/15) with 12:00 exercise, no ST changes, occasional PACs.   . Hyperlipidemia   . Insomnia   . Migraine headache   . Movement disorder    resltess in left legs  . Postmenopausal   . 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 Premature atrial contractions    a. Holter (12/15) with 8% PACs, no atrial runs or atrial fibrillation.     Past Surgical History:  Procedure Laterality Date  . BREAST BIOPSY  01/01/2011   Procedure: BREAST BIOPSY WITH NEEDLE LOCALIZATION;  Surgeon: Rolm Bookbinder, MD;  Location: [facility];  Service: General;  Laterality: Left;  left breast wire localization biopsy  . BREAST BIOPSY Right 07/27/2012  . BREAST BIOPSY Right 07/07/2016  . BREAST BIOPSY Left 06/30/2016  . cataracts right eye    . CESAREAN SECTION  1986  . HERNIA REPAIR  2000   RIH  . IR [place] TUBE PERCUT W/FLUORO  09/29/2016  . opirectinal membrane peel    . PEG PLACEMENT    . RHINOPLASTY  1983  . TRACHEOSTOMY      There were no vitals filed for this visit.       Subjective Assessment - 10/28/16 1618    Subjective Pt presents to PT evaluation following hospitalization 09-24-16  with inpatient rehab admission 9-6 - 10-27-16 with D/C home on 10-27-16.  Pt presents now in manual tilt in space wheelchair and is now using ROHO cushion due to some skin breakdown per husband's report.  Pt continues to be PEG and trach dependent.  Pt has had signficant decline in functional mobility since hospitalization with pt unable to stand and unable to sit unsupported. Pt was seen at this facility for outpatient therapies from 03-01-16 until 09-09-16 at which time pt went on vacation and was admitted to hospital upon her return due to decline in medical status.                                                                                                        Pt sitting with eyes closed in wheelchair at beginning of eval   Patient  is accompained by: Family member  [name], caregiver and husband [name]   Pertinent History Precautions:  PEG.  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  Another round of botox in LLE on 02/06/16   Limitations House hold activities;Walking;Standing;Sitting;Lifting;Writing   Patient Stated Goals Per husband, "For [name] to be as independent as possible."   Currently in Pain? No/denies            [facility] PT Assessment - 10/28/16 1636      Assessment   Medical Diagnosis Encephalitis/Acquired Brain Injury   Referring Provider Alger Simons, MD   Onset Date/Surgical Date 01/02/15  readmitted 09-24-16 with tracheobronchitis   Prior Therapy acute, IP rehab and OP rehab at [place], then [facility] OP neuro rehab, following re-admit acute, IP rehab and now OP Rehab again     Precautions   Precautions Fall   Precaution Comments PEG and trach dependent   Required Braces or Orthoses Other Brace/Splint  Pt has knee brace for LLE for knee extension - wears at nigh   Other Brace/Splint has ratchet  brace for LLE to increase knee extension     Restrictions   Weight Bearing Restrictions No     Balance Screen   Has the patient fallen in the past 6 months No     [place] residence   Living Arrangements Spouse/significant other   Available Help at Discharge Family;Home health;Personal care attendant;Available 24 hours/day  Caregiver [name] and [facility] RNs   Type of [place] Access Stairs to enter   Entrance Stairs-Number of Steps --  has stair lift   Home Layout Multi-level   Alternate Level Stairs-Number of Steps --  has stair lift   Home Equipment Wheelchair - manual  stedy, tub bench with sliding mechanism   Additional Comments Custom tilt-in-space w/c with Roho cushion      Prior Function   Level of Independence Independent  independent prior to initial injury   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      Cognition   General Comments Pt not consistently following commands     Sensation   Light Touch Appears Intact     Coordination   Gross Motor Movements are Fluid and Coordinated No   Coordination and Movement Description bouncing movements in bil. LE's wtith standing in standing frame:  coordination impaired due to ataxia, hypertonicity and rigidity     Posture/Postural Control   Posture/Postural Control Postural limitations   Postural Limitations Posterior pelvic tilt;Rounded Shoulders;Forward head;Decreased lumbar lordosis;Right pelvic obliquity;Increased lumbar lordosis;Weight shift right   Posture Comments Pt leans shoulders and head to left and pelvis goes toward right side     Tone   Assessment Location Right Lower Extremity;Left Lower Extremity     AROM   Overall AROM  Deficits;Unable to assess;Due to impaired cognition     PROM   Overall PROM  Deficits   Overall PROM Comments Lt elbow  flexed at approx. 60 degrees   Left Knee Extension minus approx. 30 degrees passively     Strength   Overall Strength Unable to assess;Deficits     Bed Mobility   Bed Mobility Not assessed     Transfers  Transfers Sit to Stand   Sit to Stand 1: +1 Total assist   Sit to Stand Details Manual facilitation for weight shifting;Verbal cues for sequencing;Verbal cues for technique;Manual facilitation for weight bearing;Tactile cues for sequencing;Tactile cues for posture   Sit to Stand Details (indicate cue type and reason) +2 total assist attempted for standing with assistance provided under each UE   Sit to Stand: Patient Percentage 10%   Stand to Sit 2: Max assist   Stand to Sit Details (indicate cue type and reason) Tactile cues for initiation;Tactile cues for sequencing;Tactile cues for weight shifting;Tactile cues for posture   Stand Pivot Transfers 1: +1 Total assist   Comments L knee is flexed due to hamstring tightness and Lt foot externally rotated     Ambulation/Gait   Ambulation/Gait No     Wheelchair Mobility   Wheelchair Mobility No     Static Sitting Balance   Static Sitting - Balance Support Feet supported   Static Sitting - Level of Assistance 2: Max assist   Static Sitting - Comment/# of Minutes pt unable to sit unsupported - pt had LOB posteriorly and needed max assist for return to upright     RLE Tone   RLE Tone Hypertonic;Other (comment)     RLE Tone   RLE Tone Comments extensor tone      LLE Tone   LLE Tone Hypertonic     LLE Tone   Hypertonic Details tremors noted in quads with standing in standing frame   LLE Tone Comments flexor synergy             Objective measurements completed on examination: See above findings.    Pt stood in standing frame for approx. 10" with mirror used for visual feedback in attempt to correct postural abnormalities.  Pt required +2 assist  For standing and to assist in reducing postural deviations.    Left knee  and hip flexed due to flexion contractures.              PT Short Term Goals - 10/28/16 1950      PT SHORT TERM GOAL #1   Title Perform sit to/from stand from wheelchair or mat with +1 mod assist.   Baseline max to total assist on 10-28-16   Time 4   Period Weeks   Status New   Target Date 11/27/16     PT SHORT TERM GOAL #2   Title Pt will perform sit to stand from wheelchair to RW with mod assist.   Baseline max to total assist - 10-28-16   Time 4   Period Weeks   Status New   Target Date 11/27/16     PT SHORT TERM GOAL #3   Title Pt will improve standing balance/trunk control so that caregiver [name]) is able to transfer pt using Stedy without requiring a 2nd person for assist.     Baseline 2 people required with total assist - Charlaine Dalton is unable to be used at this time - 10-28-16   Time 4   Period Weeks   Status New   Target Date 11/27/16     PT SHORT TERM GOAL #4   Title Pt will sit with RUE support with mod assist unsupported on side of mat for at least 5" to demo improved trunk strength and core stabilization.   Baseline max assist needed on 10-28-16   Time 4   Period Weeks   Status New     PT SHORT TERM GOAL #  5   Title Basic transfers from wheelchair to bed or commode with +1 mod assist.     Baseline max to total assist  - 10-28-16           PT Long Term Goals - 10/28/16 2004      PT LONG TERM GOAL #1   Title Pt will perform supine rolling R and L with min assist  to indicate improved independence with bed mobility.     Baseline dependent for all bed mobility per spouse's report - 10-28-16   Time 8   Period Weeks   Status New   Target Date 12/27/16     PT LONG TERM GOAL #2   Title Pt will sit on side of mat with RUE support for at least 5" with min assist.   Baseline max assist on 10-28-16   Time 8   Period Weeks   Status New   Target Date 12/27/16     PT LONG TERM GOAL #3   Title Pt will transfer Lt/Rt sidelying to sitting with min assist for  incr. independence with getting out of bed.     Baseline max to total assist needed - 10-28-16   Time 8   Period Weeks   Status New   Target Date 12/27/16     PT LONG TERM GOAL #4   Title Pt will perform basic transfers with min to mod assist to indicate improved functional independence.     Baseline max assist - 08-26-16   Time 8   Period Weeks   Status New   Target Date 12/27/16     PT LONG TERM GOAL #5   Title Pt will perform static standing with bil. UE support with mod assist.     Baseline pt able to stand in standing frame with mod assist to minimize deviations of shoulders leaning to left side with pelvis shifted to right side- 10-28-16   Time 8   Period Weeks   Status New   Target Date 12/27/16     PT LONG TERM GOAL #7   Title Perform sit to stand and stand to sit with mod assist.     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  Pt returns to OP neuro PT after hospitalization for tracheobronchitis, pneumonia, and respiratory failure  from 09-24-16 until 10-28-16 with admission to CIR on 09-30-16.  Pt has had signficant decline in functional mobility since July 2018.  Pt presents with dependencies for all mobility and transfers and is unable to stand/ambulate.  Pt is unable to sit unsupported.  Pt now using custom manual tilt in space wheelchair so that pressure relief may be performed by caregiver.  PMH includes PEG, trach dependent, pneumonia, respiratory failure  History and Personal Factors relevant to plan of care: Encephalitis with spastic tetraplegia due to [place] virus (onset Dec. 2016): Pt has 24 hour;  recent dx of breast cancer s/p lumpectomy; pt has PCA 24 hours/day - caregiver [name] and [facility] RN's; pt was previously participating in [place] (April- July 2018)    Clinical Presentation Evolving   Clinical Presentation due to: encephalitis and spastic tetraplegia due to [place] virus:  tracheobronchitis and pneumonia in August 2018 resutling in most recent hospitalization   Clinical Decision Making Moderate   Rehab Potential Fair   Clinical Impairments Affecting Rehab Potential Chronicity of condition, continued medical management, but has very strong family and financial support   PT Frequency 3x / week  plan to decrease to 2x/week after iniital 4 weeks   PT Duration 8 weeks   PT Treatment/Interventions ADLs/Self Care Home Management;DME Instruction;Gait training;Stair training;Functional mobility training;Therapeutic activities;Patient/family education;Cognitive remediation;Neuromuscular re-education;Therapeutic exercise;Balance training;Orthotic Fit/Training;Wheelchair mobility training;Manual techniques;Splinting;Visual/perceptual remediation/compensation;Vestibular;Passive range of motion;Electrical Stimulation   PT Next Visit Plan sitting balance (will need 2nd person); standing frame : or bed mobility with AAROM bil. LE's (your choice)   PT Home Exercise Plan family/caregiver has ROM exercises from previous admissions   Consulted and Agree with Plan of Care Family member/caregiver;Patient   Family Member Consulted husband [name]      Patient will benefit from skilled therapeutic intervention in order to improve the following deficits and impairments:  Abnormal gait, Decreased balance, Decreased endurance, Decreased cognition, Cardiopulmonary status limiting activity, Decreased activity tolerance, Decreased coordination, Decreased strength, Impaired flexibility, Impaired tone, Decreased mobility, Increased muscle spasms, Impaired vision/preception, Decreased range of motion, Impaired UE  functional use, Postural dysfunction, Improper body mechanics, Impaired perceived functional ability, Decreased knowledge of precautions, Decreased knowledge of use of DME  Visit Diagnosis: Other abnormalities of gait and mobility - Plan: PT plan of care cert/re-cert  Abnormal posture - Plan: PT plan of care cert/re-cert  Muscle weakness (generalized) - Plan: PT plan of care cert/re-cert  Other lack of coordination - Plan: PT plan of care cert/re-cert  Other symptoms and signs involving the nervous system - Plan: PT plan of care cert/re-cert  Other abnormal involuntary movements - Plan: PT plan of care cert/re-cert      G-Codes - 54/00/86 28-Apr-2025    Functional Assessment Tool Used (Outpatient Only) pt is dependent for all mobility - unable to sit unsupported, unable to stand/ambulate   Functional Limitation Mobility: Walking and moving around   Mobility: Walking and Moving Around Current Status ([id number]) At least 80 percent but less than 100 percent impaired, limited or restricted   Mobility: Walking and Moving Around Goal Status [phone]) At least 60 percent but less than 80 percent impaired, limited or restricted       Problem List Patient Active Problem List   Diagnosis Date Noted  . Pseudomonas urinary tract infection 10/27/2016  . Severe muscle deconditioning 09/30/2016  . Pressure injury of skin 09/28/2016  . Respiratory failure ([place])   . Tracheobronchitis   . Urinary retention   . PEG (percutaneous endoscopic gastrostomy) status ([place])   . Benign essential HTN   . Hyponatremia   . Hypokalemia   . Reactive hypertension   . Respiratory insufficiency 09/24/2016  . Malignant neoplasm of upper-inner quadrant of left breast in female, estrogen receptor positive ([place]) 07/14/2016  . Heterotopic ossification of bone 03/10/2016  . Acute on chronic respiratory failure with hypoxia ([place])   . Acute hypoxemic respiratory failure ([place])   . Acute blood loss anemia   .  Atelectasis  02/04/2016  . Debility 02/03/2016  . Pneumonia of both lower lobes due to Pseudomonas species ([place])   . Pneumonia of both lower lobes due to methicillin susceptible Staphylococcus aureus (MSSA) ([place])   . Acute respiratory failure with hypoxia ([place])   . Acute encephalopathy   . Seizures ([place])   . Sepsis ([place])   . Hypoxia   . Pain   . HCAP (healthcare-associated pneumonia) 12/28/2015  . Chronic respiratory failure ([place]) 12/28/2015  . Acute tracheobronchitis 12/24/2015  . Tracheostomy tube present ([place])   . Tracheal stenosis   . Chronic respiratory failure with hypoxia ([place])   . Spastic tetraplegia ([place]) 10/20/2015  . Tracheostomy status ([place]) 09/26/2015  . Allergic rhinitis 09/26/2015  . 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 Movement disorder 09/22/2015  . Encephalitis 09/22/2015  . Chest pain 02/07/2014  . Premature atrial contractions 01/13/2014  . Abnormality of gait 12/04/2013  . Hyperlipidemia 12/21/2011  . Cardiovascular risk factor 12/21/2011  . Bunion 01/31/2008  . Metatarsalgia of both feet 09/20/2007  . FLAT FOOT 09/20/2007  . HALLUX RIGIDUS, ACQUIRED 09/20/2007    Alda Lea, PT 10/28/2016, 8:31 PM  [facility] [address] [place], [place], [address] Phone: [phone]   Fax:  [phone]  Name: Judy Spears MRN: [id number] Date of Birth: 1950/09/19

## 2016-10-28 NOTE — Progress Notes (Signed)
Social Work  Discharge Note  The overall goal for the admission was met for:   Discharge location: Yes - home with spouse and private duty caregivers providing 24/7 care  Length of Stay: Yes - 27 days  Discharge activity level: Yes - max - total assistance  Home/community participation: Yes  Services provided included: MD, RD, PT, OT, SLP, RN, TR, Pharmacy and [place]: [facility] Medicare  Follow-up services arranged: Outpatient: PT, OT, ST via [facility], DME: drop arm commode, pulmonary vest via [facility] and Patient/Family has no preference for HH/DME agencies  Comments (or additional information):  Patient/Family verbalized understanding of follow-up arrangements: Yes  Individual responsible for coordination of the follow-up plan: spouse  Confirmed correct DME delivered: Criselda Starke 10/28/2016    Deanne Bedgood

## 2016-10-29 ENCOUNTER — Ambulatory Visit: Payer: Medicare Other | Admitting: Physical Therapy

## 2016-11-01 ENCOUNTER — Ambulatory Visit: Payer: Medicare Other | Admitting: Occupational Therapy

## 2016-11-01 ENCOUNTER — Ambulatory Visit: Payer: Medicare Other | Admitting: Physical Therapy

## 2016-11-01 ENCOUNTER — Encounter: Payer: Self-pay | Admitting: Occupational Therapy

## 2016-11-01 ENCOUNTER — Encounter: Payer: Self-pay | Admitting: Physical Therapy

## 2016-11-01 ENCOUNTER — Other Ambulatory Visit: Payer: Self-pay | Admitting: Physical Medicine & Rehabilitation

## 2016-11-01 DIAGNOSIS — M25622 Stiffness of left elbow, not elsewhere classified: Secondary | ICD-10-CM

## 2016-11-01 DIAGNOSIS — R29818 Other symptoms and signs involving the nervous system: Secondary | ICD-10-CM

## 2016-11-01 DIAGNOSIS — M6281 Muscle weakness (generalized): Secondary | ICD-10-CM

## 2016-11-01 DIAGNOSIS — R2689 Other abnormalities of gait and mobility: Secondary | ICD-10-CM | POA: Diagnosis not present

## 2016-11-01 DIAGNOSIS — R293 Abnormal posture: Secondary | ICD-10-CM

## 2016-11-01 DIAGNOSIS — R258 Other abnormal involuntary movements: Secondary | ICD-10-CM

## 2016-11-01 DIAGNOSIS — R41844 Frontal lobe and executive function deficit: Secondary | ICD-10-CM

## 2016-11-01 DIAGNOSIS — M25612 Stiffness of left shoulder, not elsewhere classified: Secondary | ICD-10-CM

## 2016-11-01 DIAGNOSIS — M25611 Stiffness of right shoulder, not elsewhere classified: Secondary | ICD-10-CM

## 2016-11-01 NOTE — Therapy (Signed)
[facility] [address] [place], [place], [address] Phone: [phone]   Fax:  [phone]  Occupational Therapy Treatment  Patient Details  Name: Judy Spears MRN: [id number] Date of Birth: [birth year] Referring Provider: Dr. Sharlett Iles. Riley Kill  Encounter Date: 11/01/2016    Past Medical History:  Diagnosis Date  . Arthritis    lt great toe  . Complication of anesthesia    pt has had headaches post op-did advise to hydra well preop  . Hair loss    right-sided  . History of exercise stress test    a. ETT (12/15) with 12:00 exercise, no ST changes, occasional PACs.   . Hyperlipidemia   . Insomnia   . Migraine headache   . Movement disorder    resltess in left legs  . Postmenopausal   . 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 Premature atrial contractions    a. Holter (12/15) with 8% PACs, no atrial runs or atrial fibrillation.     Past Surgical History:  Procedure Laterality Date  . BREAST BIOPSY  01/01/2011   Procedure: BREAST BIOPSY WITH NEEDLE LOCALIZATION;  Surgeon: Emelia Loron, MD;  Location: [facility];  Service: General;  Laterality: Left;  left breast wire localization biopsy  . BREAST BIOPSY Right 07/27/2012  . BREAST BIOPSY Right 07/07/2016  . BREAST BIOPSY Left 06/30/2016  . cataracts right eye    . CESAREAN SECTION  1986  . HERNIA REPAIR  2000   RIH  . IR REPLC GASTRO/COLONIC TUBE PERCUT W/FLUORO  09/29/2016  . opirectinal membrane peel    . PEG PLACEMENT    . RHINOPLASTY  1983  . TRACHEOSTOMY      There were no vitals filed for this visit.                              OT Short Term Goals - 11/01/16 0826      OT SHORT TERM GOAL  #9   TITLE Pt and family will be mod I with upgraded home activities program - 08/09/2016   Status Achieved     OT SHORT TERM GOAL  #10   TITLE Pt will be min a stand step toilet transfers at least 50% of the time using either  grab bar or walker for community transfers   Status Achieved     OT SHORT TERM GOAL  #11   TITLE Pt will consistently move from sit to stand with no more than min a for functional mobility   Status Achieved     OT SHORT TERM GOAL  #12   TITLE Pt will be no more than mod a consistently for bathing at shower level - 10/04/2016   Status On-going           OT Long Term Goals - 11/01/16 0826      OT LONG TERM GOAL #1   Title Pt, husband and caregiver will be mod I with upgraded HEP prn - 04/26/2016   Status Achieved     OT LONG TERM GOAL #2   Title Pt, husband and caregiver will be mod I with splint wear and care    Status Achieved     OT LONG TERM GOAL #3   Title Pt will be able to wash face with min a only   Status Achieved     OT LONG TERM GOAL #4   Title Pt will be able to sit  without UE support with supevision 100% of the time with no more than 2 vc's while engaged in functional activity.   Status Achieved     OT LONG TERM GOAL #5   Title Pt, husband and caregiver will verbalize understanding of potential after care therapeutic opportunities. - 07/08/2016 date adjusted as pt missed 2 weeks due to hospitalization   Status Achieved     OT LONG TERM GOAL #6   Title Pt will be supervision to wash face consistently   Status Achieved     OT LONG TERM GOAL #7   Title Pt will don shirt with min a and max vc's 50% of the time   Status Achieved     OT LONG TERM GOAL #8   Title Pt will require min a for stand step transfers to toilet using grab bar or walker for community toileting at least 75% of the time. - 11/01/2016   Status On-going     OT LONG TERM GOAL  #9   Baseline Pt will stand with contact gurard with only 1 UE support for static standing balance in prep for abiilty to engage in functional task while standing.    Status Achieved     OT LONG TERM GOAL  #10   TITLE Pt will consistenly move from sit to stand with no more than CGA while caregiver assists with pulling up  pants.    Status On-going     OT LONG TERM GOAL  #11   TITLE Pt will require no more than close supervision for static standing with bilateral UE while caregiver assists with hiking pants.    Status On-going     OT LONG TERM GOAL  #12   TITLE Pt will go from stand to sit during ADL activities with no more than min a.    Status On-going               Plan - 11/01/16 0826    Clinical Impression Statement Pt  went on vacation with family and then was admitted to the hospital upon return from vacation. Pt has was discharged on 10/27/2016.  Will discharge pt at this time.       Patient will benefit from skilled therapeutic intervention in order to improve the following deficits and impairments:     Visit Diagnosis: Abnormal posture    Problem List Patient Active Problem List   Diagnosis Date Noted  . Pseudomonas urinary tract infection 10/27/2016  . Severe muscle deconditioning 09/30/2016  . Pressure injury of skin 09/28/2016  . Respiratory failure ([place])   . Tracheobronchitis   . Urinary retention   . PEG (percutaneous endoscopic gastrostomy) status ([place])   . Benign essential HTN   . Hyponatremia   . Hypokalemia   . Reactive hypertension   . Respiratory insufficiency 09/24/2016  . Malignant neoplasm of upper-inner quadrant of left breast in female, estrogen receptor positive ([place]) 07/14/2016  . Heterotopic ossification of bone 03/10/2016  . Acute on chronic respiratory failure with hypoxia ([place])   . Acute hypoxemic respiratory failure ([place])   . Acute blood loss anemia   . Atelectasis 02/04/2016  . Debility 02/03/2016  . Pneumonia of both lower lobes due to Pseudomonas species ([place])   . Pneumonia of both lower lobes due to methicillin susceptible Staphylococcus aureus (MSSA) ([place])   . Acute respiratory failure with hypoxia ([place])   . Acute encephalopathy   . Seizures ([place])   . Sepsis ([place])   . Hypoxia   .  Pain   . HCAP (healthcare-associated pneumonia) 12/28/2015   . Chronic respiratory failure ([place]) 12/28/2015  . Acute tracheobronchitis 12/24/2015  . Tracheostomy tube present ([place])   . Tracheal stenosis   . Chronic respiratory failure with hypoxia ([place])   . Spastic tetraplegia ([place]) 10/20/2015  . Tracheostomy status ([place]) 09/26/2015  . Allergic rhinitis 09/26/2015  . 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 Movement disorder 09/22/2015  . Encephalitis 09/22/2015  . Chest pain 02/07/2014  . Premature atrial contractions 01/13/2014  . Abnormality of gait 12/04/2013  . Hyperlipidemia 12/21/2011  . Cardiovascular risk factor 12/21/2011  . Bunion 01/31/2008  . Metatarsalgia of both feet 09/20/2007  . FLAT FOOT 09/20/2007  . HALLUX RIGIDUS, ACQUIRED 09/20/2007   OCCUPATIONAL THERAPY DISCHARGE SUMMARY  Visits from Start of Care: 50  Current functional level related to goals / functional outcomes: See above - pt however had significant decline functionally over several weeks and was then hospitalized for extended period of time   Remaining deficits: See last PN   Education / Equipment: HEP, ADL equipment  Plan: Patient agrees to discharge.  Patient goals were partially met. Patient is being discharged due to meeting the stated rehab goals.  ?????      Quay Burow, OTR/L 11/01/2016, 8:28 AM  [facility] [address] [place], [place], [address] Phone: [phone]   Fax:  [phone]  Name: Judy Spears MRN: [id number] Date of Birth: 11-16-1950

## 2016-11-01 NOTE — Therapy (Signed)
[facility] [address] [place], [place], [address] Phone: [phone]   Fax:  [phone]  Occupational Therapy Evaluation  Patient Details  Name: Judy Spears MRN: [id number] Date of Birth: February 05, 1950 Referring Provider: Dr. [name]  Encounter Date: 11/01/2016      OT End of Session - 11/01/16 1711    Visit Number 1   Number of Visits 16   Date for OT Re-Evaluation 12/27/16   Authorization Type medicare will need PN and G code every 10th visit   Authorization Time Period 60 days   Authorization - Visit Number 1   Authorization - Number of Visits 10   OT Start Time 1316   OT Stop Time 1400   OT Time Calculation (min) 44 min   Activity Tolerance Patient tolerated treatment well      Past Medical History:  Diagnosis Date  . Arthritis    lt great toe  . Complication of anesthesia    pt has had headaches post op-did advise to hydra well preop  . Hair loss    right-sided  . History of exercise stress test    a. ETT (12/15) with 12:00 exercise, no ST changes, occasional PACs.   . Hyperlipidemia   . Insomnia   . Migraine headache   . Movement disorder    resltess in left legs  . Postmenopausal   . 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 Premature atrial contractions    a. Holter (12/15) with 8% PACs, no atrial runs or atrial fibrillation.     Past Surgical History:  Procedure Laterality Date  . BREAST BIOPSY  01/01/2011   Procedure: BREAST BIOPSY WITH NEEDLE LOCALIZATION;  Surgeon: Rolm Bookbinder, MD;  Location: [facility];  Service: General;  Laterality: Left;  left breast wire localization biopsy  . BREAST BIOPSY Right 07/27/2012  . BREAST BIOPSY Right 07/07/2016  . BREAST BIOPSY Left 06/30/2016  . cataracts right eye    . CESAREAN SECTION  1986  . HERNIA REPAIR  2000   RIH  . IR [place] TUBE PERCUT W/FLUORO  09/29/2016  . opirectinal membrane peel    . PEG PLACEMENT    . RHINOPLASTY   1983  . TRACHEOSTOMY      There were no vitals filed for this visit.      Subjective Assessment - 11/01/16 1323    Subjective  "Hey" when therapist said hello.  Husband states intermittent pain in legs, R greater than L. Pt does not appear in pain at this time.   Patient is accompained by: Family member  husband [name] and caregiver [name]   Pertinent History see epic snapshot - ABI/encephalitis/hypoxia from virus; recent hospitalization due to MRSA PNA    Patient Stated Goals Pt unable to state.    Currently in Pain? No/denies           [facility] OT Assessment - 11/01/16 0001      Assessment   Diagnosis ABI due viral brain infection, recent hospitalization due to MRSA PNA   Referring Provider Dr. [name]   Onset Date 09/09/16  for recent hospitalization/initial insult 12/2015     Precautions   Precautions Fall   Precaution Comments peg and trach dependent     Restrictions   Weight Bearing Restrictions No     Balance Screen   Has the patient fallen in the past 6 months No     Home  Environment   Family/patient expects to be discharged to: Private  residence   Living Arrangements Spouse/significant other  24 hour caregivers as well   Type of [place] Two level   Bathroom Shower/Tub Futures trader   Additional Comments Pt has wheelchair shower seat, 3 in 1 commode, hospital bed, tilt in space wheelchair and 2 standard wheelchairs.  Pt also has steady lfit.      Prior Function   Level of Independence Independent  prior to iniital injury   Vocation Retired   Research officer, trade union, attorney   Leisure exercising, ballet, dance, reading, working in the yard     ADL   Eating/Feeding --  NPO tube feed at this time   Grooming + 1 Total assistance   Upper Body Bathing + 2 Total assistance   Lower Body Bathing + 2 Total assistance   Upper Body Dressing + 2 Total assistance   Lower Body Dressing + 2 Total assistance   Toilet  Transfer + 2 Total assistance   Toileting - Clothing Manipulation + 1 Total assistance  plus on extra person   Toileting -  Hygiene + 2 Total assostance   Tub/Shower Transfer + 2 Total assistance   ADL comments Pt has shower wheelchair and caregivers able to use that however needs 2 people      IADL   Shopping Completely unable to shop   Light Housekeeping Does not participate in any housekeeping tasks   [place] on family or friends for transportation   Medication Management Is not capable of dispensing or managing own medication   Financial Management Dependent     Mobility   Mobility Status Needs assist     Written Expression   Dominant Hand Right   Handwriting --  unable     Vision - History   Additional Comments Pt with signficant visual deficits - see prior eval and PN. Very difficult to assess due to cognitive, language and UE deficits however appears to have both visual field deficits as well as visual vestibular and ocular righting deficits.       Vision Assessment   Comment see above     Activity Tolerance   Activity Tolerance Tolerates 30 min activity with muliple rests   Sitting Balance Performs 25%or less sitting activity   Activity Tolerance Comments Pt with severe L bias in sitting - with weight bearing on RUE in sidesitting and then return to sitting pt able to hold midline for brief period which suggests this is primarily a perceptual issue however pt exhibits abnormal postural alignment and poor ability to control forward weight shift as well as lateral weight shift.      Cognition   Overall Cognitive Status Impaired/Different from baseline   Mini Mental State Exam  See iniital eval - pt with significant and severe cognitive deficits in all areas. Pt presenting with worsening alertness, ability to consistently follow simple one step commands, focused and sustained attention.  All higher level skills severely impaired. Pt also displays greater  difficulty tracking simple conversation and no demonstration of humor (prior to decline pt able to appreciate basic humor in simple conversation)     Sensation   Light Touch Appears Intact     Coordination   Gross Motor Movements are Fluid and Coordinated No   Fine Motor Movements are Fluid and Coordinated No   Other Pt unable to participate in standardized testing at this time     Perception   Perception Impaired  Praxis   Praxis Impaired     Tone   Assessment Location Right Upper Extremity;Left Upper Extremity     AROM   Overall AROM  Deficits   Overall AROM Comments Pt with poor ability to initiate even with command use of UE's;  Pt will utilize RUE more functionally than LUE however this is limited by significant elbow contracture. unable to acces accurately due to severely impaired cognition.      PROM   Overall PROM  Deficits   Overall PROM Comments Shoulder flexion bilaterally grossly 100*, abduction 90*.  R elbow WFL"s, LUE elbow extension 0-95*, wrist flexion/extension bilaterally WFL's however pt with tight end range, hands with some limitations of PROM but with WFL's.      Strength   Overall Strength Unable to assess;Deficits   Overall Strength Comments Due to cognitive deficits and contractures.      Hand Function   Right Hand Gross Grasp Impaired   Left Hand Gross Grasp Impaired   Comment Unable to asses formally due to severely cognitive deficits.      RUE Tone   RUE Tone Within Functional Limits     LUE Tone   LUE Tone Within Functional Limits  Pt with elbow contracture but no true velocity tone                           OT Short Term Goals - 11/01/16 1640      OT SHORT TERM GOAL #1   Title Pt, caregiver and husband will be mod I with HEP - 11/29/2016   Status New     OT SHORT TERM GOAL #2   Title Pt will consistently require max assist x1 from caregiver or husband for basic transfers (currently total A x 2 for untrained health  professional)   Status New     OT SHORT TERM GOAL #3   Title Pt will sit on level surfaces in preparation for participation in ADL tasks with intermittent min a and mod cues after preparation.   Status New     OT SHORT TERM GOAL #4   Title Pt will require no more than total assist x1 from family/caregiver to use steady assist for basic transfers (not toilet or shower transfers).     Status New     OT SHORT TERM GOAL #5   Title Pt will demonstrate ability to follow simple, functional commands in context at least 50% of the time.    Status New           OT Long Term Goals - 11/01/16 1645      OT LONG TERM GOAL #1   Title Pt , husband and caregiver will be mod I with upgraded HEP prn - 12/27/2016   Status New     OT LONG TERM GOAL #2   Title Pt will will demonstrate ability to sit on even surface with supervision while engaging in simple activity   Status New     OT LONG TERM GOAL #3   Title Pt will be mod a for squat pivot basic transfers   Status New     OT LONG TERM GOAL #4   Title Pt will be mod a x1 to use steady for toilet transfers   Status New     OT LONG TERM GOAL #5   Title Pt will be max a x1 for clothing mgmt using steady for toilet transfers   Status New  Long Term Additional Goals   Additional Long Term Goals Yes     OT LONG TERM GOAL #6   Title Pt will demonstrate ability to don shirt over both arms with set up.    Status New     OT LONG TERM GOAL #7   Title Pt will demonstrate ability to sit with supevision in wheelchair shower seat during shower.    Status New               Plan - 11/01/16 1650    Clinical Impression Statement Pt is a 66 year old female who was diagnosed in 12/2015 with encephalitis due to [place]. Pt first began outpatient rehab 08/2015.  Pt had made signficant progress over the past year however suddenly experienced severe functional decline approximately 2 months ago (see PN). Pt was hospitalized on 09/24/2016 -  10/27/2016 including inpt rehab stay.  Pt returns today with continued severe functional decline and the following deficits that impact all performance areas:  spastic tetraplegia, decreased A/RROM of all extremities, contracture of B shoulders as well as L elbow, decreased strength, abnormal posutre, severely impaired sitting and standing balance, impaired perception, impaired cognition, impaired vision. Pt will benefit from skilled OT to address these deficits to improve basic engagement/ability for ADL as well as reduce caregiver burden.    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   Occupational performance deficits (Please refer to evaluation for details): ADL's;IADL's;Rest and Sleep;Leisure;Social Participation;Work   Geophysicist/field seismologist   Current Impairments/barriers affecting progress: severity of deficits, very slow rate of recovery; pt with recent significant decline over past several months due medical complications   OT Frequency 2x / week   OT Duration 8 weeks   OT Treatment/Interventions Self-care/ADL training;Ultrasound;Moist Heat;Electrical Stimulation;DME and/or AE instruction;Neuromuscular education;Therapeutic exercise;Functional Mobility Training;Manual Therapy;Passive range of motion;Splinting;Therapeutic activities;Balance training;Patient/family education;Visual/perceptual remediation/compensation;Cognitive remediation/compensation   Plan NMR for trunk, functional mobility, sitting balance, basic pt initiated movement.    Consulted and Agree with Plan of Care Patient;Family member/caregiver   Family Member Consulted caregiver [place], hsuband [name]      Patient will benefit from skilled therapeutic intervention in order to improve the following deficits and  impairments:  Decreased endurance, Decreased skin integrity, Impaired vision/preception, Improper body mechanics, Impaired perceived functional ability, Decreased knowledge of precautions, Cardiopulmonary status limiting activity, Decreased activity tolerance, Decreased knowledge of use of DME, Decreased strength, Impaired flexibility, Improper spinal/pelvic alignment, Impaired sensation, Difficulty walking, Decreased mobility, Decreased balance, Decreased cognition, Decreased range of motion, Impaired tone, Impaired UE functional use, Decreased safety awareness, Decreased coordination, Pain, Abnormal gait  Visit Diagnosis: Abnormal posture - Plan: Ot plan of care cert/re-cert  Muscle weakness (generalized) - Plan: Ot plan of care cert/re-cert  Other symptoms and signs involving the nervous system - Plan: Ot plan of care cert/re-cert  Stiffness of left shoulder, not elsewhere classified - Plan: Ot plan of care cert/re-cert  Stiffness of right shoulder, not elsewhere classified - Plan: Ot plan of care cert/re-cert  Stiffness of left elbow, not elsewhere classified - Plan: Ot plan of care cert/re-cert  Frontal lobe and executive function deficit - Plan: Ot plan of care cert/re-cert  Other abnormalities of gait and mobility - Plan: Ot plan of care cert/re-cert      G-Codes - 73/22/02 1713    Functional Assessment Tool Used (  Outpatient only) skilled clinical observation as pt unable to particpate in standardized assessment   Functional Limitation Self care   Self Care Current Status [phone]) 100 percent impaired, limited or restricted   Self Care Goal Status ([id number]) At least 80 percent but less than 100 percent impaired, limited or restricted      Problem List Patient Active Problem List   Diagnosis Date Noted  . Pseudomonas urinary tract infection 10/27/2016  . Severe muscle deconditioning 09/30/2016  . Pressure injury of skin 09/28/2016  . Respiratory failure ([place])   .  Tracheobronchitis   . Urinary retention   . PEG (percutaneous endoscopic gastrostomy) status ([place])   . Benign essential HTN   . Hyponatremia   . Hypokalemia   . Reactive hypertension   . Respiratory insufficiency 09/24/2016  . Malignant neoplasm of upper-inner quadrant of left breast in female, estrogen receptor positive ([place]) 07/14/2016  . Heterotopic ossification of bone 03/10/2016  . Acute on chronic respiratory failure with hypoxia ([place])   . Acute hypoxemic respiratory failure ([place])   . Acute blood loss anemia   . Atelectasis 02/04/2016  . Debility 02/03/2016  . Pneumonia of both lower lobes due to Pseudomonas species ([place])   . Pneumonia of both lower lobes due to methicillin susceptible Staphylococcus aureus (MSSA) ([place])   . Acute respiratory failure with hypoxia ([place])   . Acute encephalopathy   . Seizures ([place])   . Sepsis ([place])   . Hypoxia   . Pain   . HCAP (healthcare-associated pneumonia) 12/28/2015  . Chronic respiratory failure ([place]) 12/28/2015  . Acute tracheobronchitis 12/24/2015  . Tracheostomy tube present ([place])   . Tracheal stenosis   . Chronic respiratory failure with hypoxia ([place])   . Spastic tetraplegia ([place]) 10/20/2015  . Tracheostomy status ([place]) 09/26/2015  . Allergic rhinitis 09/26/2015  . 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 Movement disorder 09/22/2015  . Encephalitis 09/22/2015  . Chest pain 02/07/2014  . Premature atrial contractions 01/13/2014  . Abnormality of gait 12/04/2013  . Hyperlipidemia 12/21/2011  . Cardiovascular risk factor 12/21/2011  . Bunion 01/31/2008  . Metatarsalgia of both feet 09/20/2007  . FLAT FOOT 09/20/2007  . HALLUX RIGIDUS, ACQUIRED 09/20/2007    Quay Burow, OTR/L 11/01/2016, 5:17 PM  [facility] [address] [place], [place], [address] Phone: [phone]   Fax:  [phone]  Name: BIANA HAGGAR MRN: [id number] Date of Birth:  [birth year]

## 2016-11-02 ENCOUNTER — Inpatient Hospital Stay: Payer: Medicare Other | Admitting: Adult Health

## 2016-11-02 NOTE — Therapy (Signed)
[facility] [address] [place], [place], [address] Phone: [phone]   Fax:  [phone]  Physical Therapy Treatment  Patient Details  Name: Judy Spears MRN: [id number] Date of Birth: 05-18-1950 Referring Provider: Alger Simons, MD  Encounter Date: 11/01/2016      PT End of Session - 11/01/16 1638    Visit Number 2   Number of Visits 20   Date for PT Re-Evaluation 12/27/16   Authorization Type [employer] MCR   PT Start Time 0093   PT Stop Time 1530   PT Time Calculation (min) 43 min   Equipment Utilized During Treatment Other (comment)  standing frame   Activity Tolerance Patient tolerated treatment well;Patient limited by fatigue;Patient limited by pain   Behavior During Therapy [facility] for tasks assessed/performed      Past Medical History:  Diagnosis Date  . Arthritis    lt great toe  . Complication of anesthesia    pt has had headaches post op-did advise to hydra well preop  . Hair loss    right-sided  . History of exercise stress test    a. ETT (12/15) with 12:00 exercise, no ST changes, occasional PACs.   . Hyperlipidemia   . Insomnia   . Migraine headache   . Movement disorder    resltess in left legs  . Postmenopausal   . 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 Premature atrial contractions    a. Holter (12/15) with 8% PACs, no atrial runs or atrial fibrillation.     Past Surgical History:  Procedure Laterality Date  . BREAST BIOPSY  01/01/2011   Procedure: BREAST BIOPSY WITH NEEDLE LOCALIZATION;  Surgeon: Rolm Bookbinder, MD;  Location: [facility];  Service: General;  Laterality: Left;  left breast wire localization biopsy  . BREAST BIOPSY Right 07/27/2012  . BREAST BIOPSY Right 07/07/2016  . BREAST BIOPSY Left 06/30/2016  . cataracts right eye    . CESAREAN SECTION  1986  . HERNIA REPAIR  2000   RIH  . IR [place] TUBE PERCUT W/FLUORO  09/29/2016  . opirectinal membrane peel     . PEG PLACEMENT    . RHINOPLASTY  1983  . TRACHEOSTOMY      There were no vitals filed for this visit.      Subjective Assessment - 11/01/16 1448    Subjective No new complaints. No Spears. Pt appears more alert today vs with eval.    Patient is accompained by: Family member  caregiver Judy Spears, husband Judy Spears   Pertinent History Precautions:  PEG.  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  Another round of botox in LLE on 02/06/16   Limitations House hold activities;Walking;Standing;Sitting;Lifting;Writing   Patient Stated Goals Per husband, "For Judy Spears to be as independent as possible."   Currently in Pain? No/denies   Pain Score 0-No pain        11/01/16 1648  Transfers  Transfers Sit to Stand  Sit to Stand 2: Max assist  Sit to Stand Details Manual facilitation for weight shifting;Verbal cues for sequencing;Verbal cues for technique;Manual facilitation for weight bearing;Tactile cues for sequencing;Tactile cues for posture  Sit to Stand Details (indicate cue type and reason) cues/facilitation for anterior weight shifting, hip/knee extension and trunk extension. blocking/approximation at bil knees to assist with knee extension/knee stability.   Stand  to Sit 2: Max assist  Stand to Sit Details (indicate cue type and reason) Tactile cues for initiation;Tactile cues for sequencing;Tactile cues for weight shifting;Tactile cues for posture  Stand to Sit Details assist needed for slow, controlled descent. cues/facilitation for hip/knee flexion and trunk flexion with sitting down  Stand Pivot Transfers 2: Max assist  Stand Pivot Transfer Details (indicate cue type and reason) wheelchair<>mat table with cues on weight shifitng, posture and technique. pt did accept weight through bil LE's and was able to engage  UE's to hold onto Judy Spears with both transfers. no LE movement/advancement noted with either transfer.   Comments performed in block practice of pre standing and full standing both with and without standing frame. pt with reports of LE pain with near full standing in standing frame, therefore worked on partial stands only in frame afterwards. with Judy Spears seated in front of pt worked on partial stands progressing to full stands with pt using chair arm rests to facilitate anterior weight shiftng, assist provided at pelvis and trunk. bil knees blocked for safety and to provide knee extension assistance with standing.                                   Neuro Re-ed   Neuro Re-ed Details  seated at edge of mat with feet on floor and second person assist as needed: worked on ant/post weight shiftng with pt's hands on Judy Spears shoulders with cues to "push me away" and "pull me back to you". also worked on full trunk extension with decreased trunk/pelvic rotation while seated at edge of mat. progressed to working on lateral weight shifitng via elbow taps to mat and back up with cues on UE extension and trunk activation to return to sitting. Pt needed assist/facilitaiton with movements, assist to stabilize hand/support elbow with return up for lateral taps and cues/faciliaiton at pelvis/trunk/shoulder to address trunk/hip rotations.           PT Short Term Goals - 10/28/16 1950      PT SHORT TERM GOAL #1   Title Perform sit to/from stand from wheelchair or mat with +1 mod assist.   Baseline max to total assist on 10-28-16   Time 4   Period Weeks   Status New   Target Date 11/27/16     PT SHORT TERM GOAL #2   Title Pt will perform sit to stand from wheelchair to RW with mod assist.   Baseline max to total assist - 10-28-16   Time 4   Period Weeks   Status New   Target Date 11/27/16     PT SHORT TERM GOAL #3   Title Pt will improve standing balance/trunk control so that caregiver Judy Spears) is able to transfer pt  using [place] without requiring a 2nd person for assist.     Baseline 2 people required with total assist - Judy Spears is unable to be used at this time - 10-28-16   Time 4   Period Weeks   Status New   Target Date 11/27/16     PT SHORT TERM GOAL #4   Title Pt will sit with RUE support with mod assist unsupported on side of mat for at least 5" to demo improved trunk strength and core stabilization.   Baseline max assist needed on 10-28-16   Time 4   Period Weeks   Status New     PT SHORT TERM GOAL #  5   Title Basic transfers from wheelchair to bed or commode with +1 mod assist.     Baseline max to total assist  - 10-28-16           PT Long Term Goals - 10/28/16 2004      PT LONG TERM GOAL #1   Title Pt will perform supine rolling R and L with min assist  to indicate improved independence with bed mobility.     Baseline dependent for all bed mobility per spouse's report - 10-28-16   Time 8   Period Weeks   Status New   Target Date 12/27/16     PT LONG TERM GOAL #2   Title Pt will sit on side of mat with RUE support for at least 5" with min assist.   Baseline max assist on 10-28-16   Time 8   Period Weeks   Status New   Target Date 12/27/16     PT LONG TERM GOAL #3   Title Pt will transfer Lt/Rt sidelying to sitting with min assist for incr. independence with getting out of bed.     Baseline max to total assist needed - 10-28-16   Time 8   Period Weeks   Status New   Target Date 12/27/16     PT LONG TERM GOAL #4   Title Pt will perform basic transfers with min to mod assist to indicate improved functional independence.     Baseline max assist - 08-26-16   Time 8   Period Weeks   Status New   Target Date 12/27/16     PT LONG TERM GOAL #5   Title Pt will perform static standing with bil. UE support with mod assist.     Baseline pt able to stand in standing frame with mod assist to minimize deviations of shoulders leaning to left side with pelvis shifted to right side- 10-28-16    Time 8   Period Weeks   Status New   Target Date 12/27/16     PT LONG TERM GOAL #7   Title Perform sit to stand and stand to sit with mod assist.     Baseline max to total assist needed on 10-28-16   Time 8   Period Weeks   Status New           Plan - 11/02/16 1639    Clinical Impression Statement Today's skilled session continued to work on sitting balance, stand pivot transfers, scooting, sit/stands and standing balance/tolerance. Pt did c/o leg pain with knee extension in standing frame, therefore only worked on partial stands afterwards. Pt was able to nod head to questions today and smilled at times with session, attempted to vocalize uncoherantly as well. Pt is progressing and should benefit from continued PT to progress toward unmet goals .   Rehab Potential Fair   Clinical Impairments Affecting Rehab Potential Chronicity of condition, continued medical management, but has very strong family and financial support   PT Frequency 3x / week  plan to decrease to 2x/week after iniital 4 weeks   PT Duration 8 weeks   PT Treatment/Interventions ADLs/Self Care Home Management;DME Instruction;Gait training;Stair training;Functional mobility training;Therapeutic activities;Patient/family education;Cognitive remediation;Neuromuscular re-education;Therapeutic exercise;Balance training;Orthotic Fit/Training;Wheelchair mobility training;Manual techniques;Splinting;Visual/perceptual remediation/compensation;Vestibular;Passive range of motion;Electrical Stimulation   PT Next Visit Plan work on LE stretching as able, sitting balance (with second person), scooting, sit/stands (with and without standing frame)   PT Home Exercise Plan family/caregiver has ROM exercises from previous admissions  Consulted and Agree with Plan of Care Family member/caregiver;Patient   Family Member Consulted husband Judy Spears      Patient will benefit from skilled therapeutic intervention in order to improve the  following deficits and impairments:  Abnormal gait, Decreased balance, Decreased endurance, Decreased cognition, Cardiopulmonary status limiting activity, Decreased activity tolerance, Decreased coordination, Decreased strength, Impaired flexibility, Impaired tone, Decreased mobility, Increased muscle spasms, Impaired vision/preception, Decreased range of motion, Impaired UE functional use, Postural dysfunction, Improper body mechanics, Impaired perceived functional ability, Decreased knowledge of precautions, Decreased knowledge of use of DME  Visit Diagnosis: Muscle weakness (generalized)  Other symptoms and signs involving the nervous system  Other abnormalities of gait and mobility  Other abnormal involuntary movements     Problem List Patient Active Problem List   Diagnosis Date Noted  . Pseudomonas urinary tract infection 10/27/2016  . Severe muscle deconditioning 09/30/2016  . Pressure injury of skin 09/28/2016  . Respiratory failure ([place])   . Tracheobronchitis   . Urinary retention   . PEG (percutaneous endoscopic gastrostomy) status ([place])   . Benign essential HTN   . Hyponatremia   . Hypokalemia   . Reactive hypertension   . Respiratory insufficiency 09/24/2016  . Malignant neoplasm of upper-inner quadrant of left breast in female, estrogen receptor positive ([place]) 07/14/2016  . Heterotopic ossification of bone 03/10/2016  . Acute on chronic respiratory failure with hypoxia ([place])   . Acute hypoxemic respiratory failure ([place])   . Acute blood loss anemia   . Atelectasis 02/04/2016  . Debility 02/03/2016  . Pneumonia of both lower lobes due to Pseudomonas species ([place])   . Pneumonia of both lower lobes due to methicillin susceptible Staphylococcus aureus (MSSA) ([place])   . Acute respiratory failure with hypoxia ([place])   . Acute encephalopathy   . Seizures ([place])   . Sepsis ([place])   . Hypoxia   . Pain   . HCAP (healthcare-associated pneumonia) 12/28/2015  . Chronic  respiratory failure ([place]) 12/28/2015  . Acute tracheobronchitis 12/24/2015  . Tracheostomy tube present ([place])   . Tracheal stenosis   . Chronic respiratory failure with hypoxia ([place])   . 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 Tracheostomy status ([place]) 09/26/2015  . Allergic rhinitis 09/26/2015  . 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 Movement disorder 09/22/2015  . Encephalitis 09/22/2015  . Chest pain 02/07/2014  . Premature atrial contractions 01/13/2014  . Abnormality of gait 12/04/2013  . Hyperlipidemia 12/21/2011  . Cardiovascular risk factor 12/21/2011  . Bunion 01/31/2008  . Metatarsalgia of both feet 09/20/2007  . FLAT FOOT 09/20/2007  . HALLUX RIGIDUS, ACQUIRED 09/20/2007   Judy Spears, Judy Spears, [facility] [address], Judy Spears, Judy Spears [phone] 11/02/16, 4:47 PM   Name: BIDDIE SEBEK MRN: [id number] Date of Birth: March 07, 1950

## 2016-11-03 ENCOUNTER — Ambulatory Visit: Payer: Medicare Other | Admitting: Speech Pathology

## 2016-11-03 DIAGNOSIS — R41841 Cognitive communication deficit: Secondary | ICD-10-CM

## 2016-11-03 DIAGNOSIS — R2689 Other abnormalities of gait and mobility: Secondary | ICD-10-CM | POA: Diagnosis not present

## 2016-11-03 DIAGNOSIS — R471 Dysarthria and anarthria: Secondary | ICD-10-CM

## 2016-11-03 DIAGNOSIS — R1312 Dysphagia, oropharyngeal phase: Secondary | ICD-10-CM

## 2016-11-04 ENCOUNTER — Ambulatory Visit: Payer: Medicare Other | Admitting: Occupational Therapy

## 2016-11-04 ENCOUNTER — Encounter: Payer: Self-pay | Admitting: Physical Therapy

## 2016-11-04 ENCOUNTER — Ambulatory Visit: Payer: Medicare Other | Admitting: Physical Therapy

## 2016-11-04 DIAGNOSIS — R2689 Other abnormalities of gait and mobility: Secondary | ICD-10-CM | POA: Diagnosis not present

## 2016-11-04 DIAGNOSIS — R293 Abnormal posture: Secondary | ICD-10-CM

## 2016-11-04 DIAGNOSIS — R29818 Other symptoms and signs involving the nervous system: Secondary | ICD-10-CM

## 2016-11-04 DIAGNOSIS — M6281 Muscle weakness (generalized): Secondary | ICD-10-CM

## 2016-11-04 NOTE — Therapy (Signed)
Pt returns today with new referral after hospitalization resulting in functional decline. Pt was on therapy hold; please see below discharge summary for prior goals; see evaluation for new plan of care/goals.   SPEECH THERAPY DISCHARGE SUMMARY  Visits from Start of Care: 66  Current functional level related to goals / functional outcomes: At time of last appointment, pt was consuming pleasure feeds of soft solid with thin liquids per family (SLP recommendations were dys 1/thin liquids), communicating at sentence level with usual min cues for intelligibility strategies.   Remaining deficits: Pt continued to display deficits in cognitive communication, speech intelligibility, and swallowing.     Education / Equipment: Pt was on therapy hold pending follow-up with pulmonology.  Plan: Patient agrees to discharge.  Patient goals were partially met. Patient is being discharged due to meeting the stated rehab goals.  ?????             SLP Short Term Goals - 08/26/16 1739      SLP SHORT TERM GOAL #1   Title Patient will utilize intelligibility strategies with moderate visual cues from communication partner/visual aid to communicate 5-7 word wants, needs, thoughts and ideas.   Status Achieved     SLP SHORT TERM GOAL #2   Title Patient will utilize intelligibility strategies, breath support with mod A for phrase repetition in 75% of trials.   Status Achieved     SLP SHORT TERM GOAL #3   Title Pt will swallow thin liquids, purees and soft solids in 9/10 trials with no overt signs of aspiration.   Status Achieved     SLP SHORT TERM GOAL #4   Title Patient will tolerate 6oz water, >95% with no overt signs of aspiration.   Status Achieved         SLP Long Term Goals - 08/26/16 1739      SLP LONG TERM GOAL #1   Title Patient will utilize multimodal communication/AAC to clarify wants, needs, thoughts or ideas during communication breakdowns with max A   Status Deferred     SLP  LONG TERM GOAL #2   Title Patient will utilize intelligibility strategies, breath support with min A verbal cues for 5-7 word responses with 80% accuracy.   Time 1   Status Not Met     SLP LONG TERM GOAL #3   Title Patient will demonstrate readiness for MBS to determine safety for pleasure feeds by timely initiation of swallow in 3/5 trials with ice chips/H20.   Status Achieved     SLP LONG TERM GOAL #4   Title Pt will consume pleasure feeds of desired consistency (soft solids and thin liquids) with signs of aspiration in fewer than 10% of trials with caregiver providing moderate cues and monitoring for overt signs of aspiration.   Status Deferred     SLP LONG TERM GOAL #5   Title Patient will utilize visual cue for lip closure, dry swallow for secretion management in 75% of opportunities.   Status Achieved     SLP LONG TERM GOAL #6   Title Pt will demo HEP for RMT, dysphagia with min caregiver assistance.   Time 1   Period Weeks   Status [facility] [address] [place], [place], [address] Phone: [phone]   Fax:  [phone]  Patient Details  Name: Judy Spears MRN: [id number] Date of Birth: May 10, 1950 Referring Provider:  Meredith Staggers, MD  Encounter Date: 11/03/2016  Deneise Lever, [place], CCC-SLP Speech-Language Pathologist  Aliene Altes 11/04/2016, 2:30 PM  [facility] [address] [place], [place], [address] Phone: [phone]   Fax:  [phone]

## 2016-11-04 NOTE — Therapy (Signed)
[facility] [address] [place], [place], [address] Phone: [phone]   Fax:  [phone]  Speech Language Pathology Evaluation  Patient Details  Name: Judy Spears MRN: [id number] Date of Birth: [birth year] Referring Provider: Dr. Quita Skye. [name]  Encounter Date: 11/03/2016      End of Session - 11/03/16 1848    Visit Number 1   Number of Visits 17   Date for SLP Re-Evaluation 01/05/17   SLP Start Time 1446   SLP Stop Time  1528   SLP Time Calculation (min) 42 min   Activity Tolerance Patient tolerated treatment well;Patient limited by lethargy      Past Medical History:  Diagnosis Date  . Arthritis    lt great toe  . Complication of anesthesia    pt has had headaches post op-did advise to hydra well preop  . Hair loss    right-sided  . History of exercise stress test    a. ETT (12/15) with 12:00 exercise, no ST changes, occasional PACs.   . Hyperlipidemia   . Insomnia   . Migraine headache   . Movement disorder    resltess in left legs  . Postmenopausal   . 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 Premature atrial contractions    a. Holter (12/15) with 8% PACs, no atrial runs or atrial fibrillation.     Past Surgical History:  Procedure Laterality Date  . BREAST BIOPSY  01/01/2011   Procedure: BREAST BIOPSY WITH NEEDLE LOCALIZATION;  Surgeon: Rolm Bookbinder, MD;  Location: [facility];  Service: General;  Laterality: Left;  left breast wire localization biopsy  . BREAST BIOPSY Right 07/27/2012  . BREAST BIOPSY Right 07/07/2016  . BREAST BIOPSY Left 06/30/2016  . cataracts right eye    . CESAREAN SECTION  1986  . HERNIA REPAIR  2000   RIH  . IR [place] TUBE PERCUT W/FLUORO  09/29/2016  . opirectinal membrane peel    . PEG PLACEMENT    . RHINOPLASTY  1983  . TRACHEOSTOMY      There were no vitals filed for this visit.      Subjective Assessment - 11/03/16 1847     Subjective Pt asleep, opens eyes and smiles in response to greeting from SLP.   Patient is accompained by: Family member   Special Tests husband [name], caregiver [name]   Currently in Pain? Other (Comment)  FACES 0            SLP Evaluation [facility] - 11/03/16 1847      SLP Visit Information   SLP Received On 11/04/16   Referring Provider Dr. Quita Skye. 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 She underwent a prolonged acute hospital course and subsequent inpatient rehab and day-program course through [facility] in [place], [place]. Pt initially seen for OP treatment at [facility] by SLP beginning 09/23/15 through 12/26/15. She was rehospitalized at [facility] 12/28/2015 for acute respiratory failure due to Pseudomonas and MSSA multilobar HCAP, with subsequent CIR at [facility] 02/05/16-  02/24/16. OP SLP 03/01/16-08/26/16 during which pt advanced to consuming pleasure feeds of soft solids and thin liquids with family (SLP recommended dys1/thin) and was communicating verbally in short sentences with usual min A for intelligibility strategies from commmunication partners. Pt was diagnosed with breast cancer in June 2018, with lumpectomy and treatment with anastrozole; experienced subsequent decline, hospitalized due to MRSA tracheobronchitis 09/24/2016 - 10/27/2016 including inpt rehab stay. 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 Patient had head CT 10/04/16 showing advanced bifrontal atrophy seen with neuro degenerative disease,  advanced cerebellar atrophy; MRI scan of the brain performed on 10/09/16 which shows generalized atrophy and moderate chronic small vessel ischemic changes. Returns today for evaluation.   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;Personal care attendant   Education [employer]   Vocation On disability     Pain Assessment   Pain Assessment Faces   Faces Pain Scale No hurt     Cognition   Overall Cognitive Status Impaired/Different from baseline   Area of Impairment Orientation;Attention;Memory;Following commands   Orientation Level --  difficult to assess; pt sustaining arousal for ~30 seconds   General Comments Pt not consistently following commands   Current Attention Level Focused;Sustained  Impaired; required continuous re-arousal   Following Commands Follows one step commands inconsistently  suspect lethargy/arousal impacting   Attention Focused;Sustained   Focused Attention Impaired   Focused Attention Impairment Verbal basic;Functional basic   Sustained Attention Impaired   Sustained Attention Impairment Verbal basic;Functional basic   Selective Attention Impaired   Selective Attention Impairment Verbal basic;Functional basic     Auditory Comprehension   Overall Auditory Comprehension Impaired at baseline   Yes/No Questions Impaired   Basic Immediate Environment Questions 0-24% accurate   Commands Impaired   One Step Basic Commands 25-49% accurate   Conversation Other (comment)  none observed   Other Conversation Comments Pt attempts two word utterance; unintelligible; unable to clarify with max cues, Y/N questions   Interfering Components Attention  arousal     Visual Recognition/Discrimination   Discrimination Not tested      Reading Comprehension   Reading Status Not tested     Expression   Primary Mode of Expression Other (comment)  eye contact     Verbal Expression   Overall Verbal Expression Impaired   Initiation Impaired   Automatic Speech --  none   Level of Generative/Spontaneous Verbalization Word  attempted "Aline Brochure" and "Benjie Karvonen"   Repetition Impaired   Level of Impairment Word level   Naming Not tested   Pragmatics Impairment   Impairments Eye contact   Interfering Components Attention;Speech intelligibility;Anatomical limitation   Non-Verbal Means of Communication Eye gaze   Other Verbal Expression Comments Made attempts x5 however no vocalizations observed     Written Expression   Dominant Hand Right   Written Expression Not tested     Oral Motor/Sensory Function   Overall Oral Motor/Sensory Function Impaired   Labial ROM Reduced right;Reduced left   Labial Symmetry Within Functional Limits   Labial Strength Reduced   Labial Coordination Reduced   Lingual ROM Reduced right;Reduced left   Lingual Symmetry Within Functional Limits   Lingual Strength Reduced   Lingual Coordination Reduced   Facial ROM Reduced right;Reduced left  Facial Symmetry Within Functional Limits   Facial Strength Reduced   Velum --  unable to assess     Motor Speech   Overall Motor Speech Impaired   Respiration Impaired   Level of Impairment Word   Phonation Wet;Aphonic   Articulation Impaired   Level of Impairment Word   Intelligibility Intelligibility reduced   Word 0-24% accurate   Phrase 0-24% accurate   Sentence Not tested   Conversation Not tested   Interfering Components Premorbid status   Phonation Impaired   Tension Present Neck;Shoulder   Volume --  inaudible   Pitch Aphonic                         SLP Education - 11/04/16 1813    Education provided Yes   Education Details Attempt lip closure with IMT device at home; continue whistle/blowing exercises from CIR,  bring to next session    Person(s) Educated Patient;Spouse;Caregiver(s)   Methods Explanation   Comprehension Verbalized understanding          SLP Short Term Goals - 11/04/16 1817      SLP SHORT TERM GOAL #1   Title Pt will demonstrate inspiratory and blowing exercises to increase respiratory support and coordination for vocalizations with usual mod A   Baseline     Time 4   Period Weeks   Status New     SLP SHORT TERM GOAL #2   Title Pt will achieve audible phonation with prolonged vowels and/or CV syllables in 75% of trials with usual mod A.    Baseline       Time 4   Period Weeks   Status New     SLP SHORT TERM GOAL #3   Title Pt will coordinate respiration/phonation for imitation of single words in 75% of trials with usual mod A.      SLP SHORT TERM GOAL #4   Title Pt will sustain attention to therapy tasks for 7 minutes with mod A multimodal cues.    Baseline     Time 4   Period Weeks   Status New     SLP SHORT TERM GOAL #5   Title Pt will participate in thorough oral care regimen at least 4 times per day and prior to trials of ice chips per caregiver report over 3 sessions.   Baseline       Time 4   Period Weeks   Status New          SLP Long Term Goals - 11/04/16 1826      SLP LONG TERM GOAL #1   Title Pt will demonstrate cough/swallow for secretion management with min A from caregiver over 5 sessions.   Time 8   Period Weeks   Status New     SLP LONG TERM GOAL #2   Title Pt will demonstrate audible phonation/intelligible speech for 2 word phrases with usual min A in 7/10 trials.   Time 8   Period Weeks   Status New     SLP LONG TERM GOAL #3   Title Pt will communicate a basic want/ need by any functional means, including AAC, with mod A from caregivers over 3 sessions.   Time 8   Period Weeks   Status New     SLP LONG TERM GOAL #4   Title Pt will demonstrate appearance of timely swallow/ functional oral manipulation of ice chips in 4/5  trials with usual mod A.  Time 8   Period Weeks   Status New          Plan - 11/03/16 1445    Clinical Impression Statement Patient presents with ongoing deficits in speech intelligibility (dysarthria, reduced breath support), cognitive communication and swallowing (see prior evaluations for additional details). During today's evaluation, a significant decline is apparent in patient's focused/sustained attention, verbal communication. Today she sustained arousal for periods of 30-60 seconds with usual max A. She is able to imitate articulatory movements for vowels (/u/ /i/) and  2/3 names of her grandchildren Aline Brochure, [place]), with max A, however movements are minimal and pt perseverates in subsequent attempts at other speech tasks. Pt does not attain voicing today despite max cues, however was achieving voicing in 50% of opportunities in sessions in CIR. Per husband's report her arousal has been fluctuating and she is especially lethargic today. Expressive communication hindered not only by reduced breath support and oral weakness, but also cognitive and likely motor planning deficits impacting articulation and coordination of respiration/phonation. Inspection of pt's oral cavity revealed significant xerostomia; tongue is dry, cracked, and dried white secretions are visible along tonsils and hard palate. Pt's breath sounds are wet, suggestive of poor management of secretions. No PO trials administered during this evaluation due to pt's high risk for aspiration in her deconditioned state with need for thorough oral care (no suction available). Pt's husband expresses a desire to focus goals more heavily on communication vs swallowing at this time; will continue to target swallowing as pt's management of secretions has impacted her intelligibility in the past. Pt is scheduled for AAC evaluation at [facility] later this month; plan to continuously assess pt's progress with functional speech and add AAC goals as  appropriate. Recommend skilled ST to address pt's decline in communication and swallowing  in order to maximize functional communication, secretion management/ swallowing safety, reduce caregiver burden and improve quality of life.   Speech Therapy Frequency 2x / week   Duration --  8 weeks   Treatment/Interventions Aspiration precaution training;Trials of upgraded texture/liquids;Compensatory strategies;Functional tasks;Patient/family education;Cueing hierarchy;Diet toleration management by SLP;Cognitive reorganization;Compensatory techniques;SLP instruction and feedback;Internal/external aids;Multimodal communcation approach;Pharyngeal strengthening exercises   Potential to Achieve Goals Fair   Potential Considerations Ability to learn/carryover information;Co-morbidities;Severity of impairments;Cooperation/participation level;Previous level of function;Pain level   Consulted and Agree with Plan of Care Patient;Family member/caregiver   Family Member Consulted caregiver leesa, husband chip      Patient will benefit from skilled therapeutic intervention in order to improve the following deficits and impairments:   Dysarthria and anarthria  Cognitive communication deficit  Dysphagia, oropharyngeal phase      G-Codes - 23-Nov-2016 1838    Functional Assessment Tool Used --   Functional Limitations --   Swallow Current Status (E3662) --   Swallow Goal Status (H4765) --   Spoken Language Expression Current Status (Y6503) --   Spoken Language Expression Goal Status (T4656) --      Problem List Patient Active Problem List   Diagnosis Date Noted  . Pseudomonas urinary tract infection 10/27/2016  . Severe muscle deconditioning 09/30/2016  . Pressure injury of skin 09/28/2016  . Respiratory failure ([place])   . Tracheobronchitis   . Urinary retention   . PEG (percutaneous endoscopic gastrostomy) status ([place])   . Benign essential HTN   . Hyponatremia   . Hypokalemia   . Reactive  hypertension   . Respiratory insufficiency 09/24/2016  . Malignant neoplasm of upper-inner quadrant of left breast in female, estrogen receptor positive (  [place]) 07/14/2016  . Heterotopic ossification of bone 03/10/2016  . Acute on chronic respiratory failure with hypoxia ([place])   . Acute hypoxemic respiratory failure ([place])   . Acute blood loss anemia   . Atelectasis 02/04/2016  . Debility 02/03/2016  . Pneumonia of both lower lobes due to Pseudomonas species ([place])   . Pneumonia of both lower lobes due to methicillin susceptible Staphylococcus aureus (MSSA) ([place])   . Acute respiratory failure with hypoxia ([place])   . Acute encephalopathy   . Seizures ([place])   . Sepsis ([place])   . Hypoxia   . Pain   . HCAP (healthcare-associated pneumonia) 12/28/2015  . Chronic respiratory failure ([place]) 12/28/2015  . Acute tracheobronchitis 12/24/2015  . Tracheostomy tube present ([place])   . Tracheal stenosis   . Chronic respiratory failure with hypoxia ([place])   . 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 Tracheostomy status ([place]) 09/26/2015  . Allergic rhinitis 09/26/2015  . 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 Movement disorder 09/22/2015  . Encephalitis 09/22/2015  . Chest pain 02/07/2014  . Premature atrial contractions 01/13/2014  . Abnormality of gait 12/04/2013  . Hyperlipidemia 12/21/2011  . Cardiovascular risk factor 12/21/2011  . Bunion 01/31/2008  . Metatarsalgia of both feet 09/20/2007  . FLAT FOOT 09/20/2007  . HALLUX RIGIDUS, ACQUIRED 09/20/2007   Deneise Lever, [place], [place] Speech-Language Pathologist  Aliene Altes 11/04/2016, [facility] [address] [place], [place], [address] Phone: [phone]   Fax:  [phone]  Name: HADLEA FURUYA MRN: [id number] Date of Birth: 1950-12-17

## 2016-11-05 ENCOUNTER — Ambulatory Visit: Payer: Medicare Other | Admitting: Physical Therapy

## 2016-11-06 NOTE — Therapy (Signed)
[facility] [address] [place], [place], [address] Phone: [phone]   Fax:  [phone]  Physical Therapy Treatment  Patient Details  Name: Judy Spears MRN: [id number] Date of Birth: 1950-12-25 Referring Provider: Alger Simons, MD  Encounter Date: 11/04/2016     11/04/16 1020  PT Visits / Re-Eval  Visit Number 3  Number of Visits 20  Date for PT Re-Evaluation 12/27/16  Authorization  Authorization Type UHC MCR  PT Time Calculation  PT Start Time 1018  PT Stop Time 1100  PT Time Calculation (min) 42 min  PT - End of Session  Equipment Utilized During Treatment none  Activity Tolerance Patient tolerated treatment well;Patient limited by fatigue;Patient limited by pain  Behavior During Therapy [facility] for tasks assessed/performed    Past Medical History:  Diagnosis Date  . Arthritis    lt great toe  . Complication of anesthesia    pt has had headaches post op-did advise to hydra well preop  . Hair loss    right-sided  . History of exercise stress test    a. ETT (12/15) with 12:00 exercise, no ST changes, occasional PACs.   . Hyperlipidemia   . Insomnia   . Migraine headache   . Movement disorder    resltess in left legs  . Postmenopausal   . 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 Premature atrial contractions    a. Holter (12/15) with 8% PACs, no atrial runs or atrial fibrillation.     Past Surgical History:  Procedure Laterality Date  . BREAST BIOPSY  01/01/2011   Procedure: BREAST BIOPSY WITH NEEDLE LOCALIZATION;  Surgeon: Rolm Bookbinder, MD;  Location: [facility];  Service: General;  Laterality: Left;  left breast wire localization biopsy  . BREAST BIOPSY Right 07/27/2012  . BREAST BIOPSY Right 07/07/2016  . BREAST BIOPSY Left 06/30/2016  . cataracts right eye    . CESAREAN SECTION  1986  . HERNIA REPAIR  2000   RIH  . IR [place] TUBE PERCUT W/FLUORO  09/29/2016  .  opirectinal membrane peel    . PEG PLACEMENT    . RHINOPLASTY  1983  . TRACHEOSTOMY      There were no vitals filed for this visit.      11/04/16 1019  Symptoms/Limitations  Subjective No new complaints. No falls. Reports no pain today.   Patient is accompained by: (caregiver [name])  Pertinent History Precautions:  PEG.  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  Another round of botox in LLE on 02/06/16  Limitations House hold activities;Walking;Standing;Sitting;Lifting;Writing  Patient Stated Goals Per husband, "For [name] to be as independent as possible."  Pain Assessment  Currently in Pain? No/denies  Pain Score 0      11/04/16 1022  Bed Mobility  Supine to Sit 2: Max assist  Supine to Sit Details (indicate cue type and reason) cues on sequencing and technique  Sit to Supine 2: Max assist  Sit to Supine - Details (indicate cue type and reason) cues on sequencing and technique  Transfers  Transfers Sit to Stand;Stand to Sit  Sit to Stand 2: Max assist;With upper extremity assist;From bed  Sit to Stand Details Manual facilitation for weight shifting;Verbal cues for sequencing;Verbal cues for technique;Manual facilitation for weight bearing;Tactile cues for sequencing;Tactile cues for posture  Sit to  Stand Details (indicate cue type and reason) cues/facilitation for weight shifting. LE weight beairng and posture with standing. tactile cuesing/approximation at bil knees to to assist with knee extension.                            Stand to Sit 2: Max assist;With upper extremity assist;To bed  Stand to Sit Details (indicate cue type and reason) Tactile cues for initiation;Tactile cues for sequencing;Tactile cues for weight shifting;Tactile cues for posture  Stand to Sit Details assistance needed for  slow, controlled descent with sitting down.   Stand Pivot Transfers 2: Max assist  Stand Pivot Transfer Details (indicate cue type and reason) wheelchair <> mat table with cues on technique/sequencing and facilitation for posture and knee extension.               Comments continued with blocked practice of partial stands progressing to full stands both with pt using chair armrests, then with pt holding PTA UE's with standing.                          Neuro Re-ed   Neuro Re-ed Details  seated at edge of mat with feet on floor and second person assist as needed: worked on ant/post weight shiftng with pt's hands on PTA shoulders with cues to "push me away" and "pull me back to you". also worked on full trunk extension with decreased trunk/pelvic rotation while seated at edge of mat. progressed to working on lateral weight shifitng via elbow taps to mat and back up with cues on UE extension and trunk activation to return to sitting. Pt needed assist/facilitaiton with movements, assist to stabilize hand/support elbow with return up for lateral taps and cues/faciliaiton at pelvis/trunk/shoulder to address trunk/hip rotations.   Exercises  Other Exercises  suping on mat table with wedge and pillows behing her: bil knee extension stretches with gentle overpressure, followed by knee flexion/extension x 10 reps each side.           PT Short Term Goals - 10/28/16 1950      PT SHORT TERM GOAL #1   Title Perform sit to/from stand from wheelchair or mat with +1 mod assist.   Baseline max to total assist on 10-28-16   Time 4   Period Weeks   Status New   Target Date 11/27/16     PT SHORT TERM GOAL #2   Title Pt will perform sit to stand from wheelchair to RW with mod assist.   Baseline max to total assist - 10-28-16   Time 4   Period Weeks   Status New   Target Date 11/27/16     PT SHORT TERM GOAL #3   Title Pt will improve standing balance/trunk control so that caregiver [name]) is able to transfer  pt using [place] without requiring a 2nd person for assist.     Baseline 2 people required with total assist - Charlaine Dalton is unable to be used at this time - 10-28-16   Time 4   Period Weeks   Status New   Target Date 11/27/16     PT SHORT TERM GOAL #4   Title Pt will sit with RUE support with mod assist unsupported on side of mat for at least 5" to demo improved trunk strength and core stabilization.   Baseline max assist needed on 10-28-16   Time 4   Period Weeks  Status New     PT SHORT TERM GOAL #5   Title Basic transfers from wheelchair to bed or commode with +1 mod assist.     Baseline max to total assist  - 10-28-16           PT Long Term Goals - 10/28/16 2004      PT LONG TERM GOAL #1   Title Pt will perform supine rolling R and L with min assist  to indicate improved independence with bed mobility.     Baseline dependent for all bed mobility per spouse's report - 10-28-16   Time 8   Period Weeks   Status New   Target Date 12/27/16     PT LONG TERM GOAL #2   Title Pt will sit on side of mat with RUE support for at least 5" with min assist.   Baseline max assist on 10-28-16   Time 8   Period Weeks   Status New   Target Date 12/27/16     PT LONG TERM GOAL #3   Title Pt will transfer Lt/Rt sidelying to sitting with min assist for incr. independence with getting out of bed.     Baseline max to total assist needed - 10-28-16   Time 8   Period Weeks   Status New   Target Date 12/27/16     PT LONG TERM GOAL #4   Title Pt will perform basic transfers with min to mod assist to indicate improved functional independence.     Baseline max assist - 08-26-16   Time 8   Period Weeks   Status New   Target Date 12/27/16     PT LONG TERM GOAL #5   Title Pt will perform static standing with bil. UE support with mod assist.     Baseline pt able to stand in standing frame with mod assist to minimize deviations of shoulders leaning to left side with pelvis shifted to right side-  10-28-16   Time 8   Period Weeks   Status New   Target Date 12/27/16     PT LONG TERM GOAL #7   Title Perform sit to stand and stand to sit with mod assist.     Baseline max to total assist needed on 10-28-16   Time 8   Period Weeks   Status New        11/04/16 1020  Plan  Clinical Impression Statement Today's skilled session addressed LE stretching, sitting balance and transfers. Pt continued to report LE pain with standing today. Pt also continued to engage with smiles, head nodding and attempts at vocalization during session. Pt continues to need increased assistance with all mobility. Pt should benefit from continued PT to progress toward unmet goals.   Pt will benefit from skilled therapeutic intervention in order to improve on the following deficits Abnormal gait;Decreased balance;Decreased endurance;Decreased cognition;Cardiopulmonary status limiting activity;Decreased activity tolerance;Decreased coordination;Decreased strength;Impaired flexibility;Impaired tone;Decreased mobility;Increased muscle spasms;Impaired vision/preception;Decreased range of motion;Impaired UE functional use;Postural dysfunction;Improper body mechanics;Impaired perceived functional ability;Decreased knowledge of precautions;Decreased knowledge of use of DME  Rehab Potential Fair  Clinical Impairments Affecting Rehab Potential Chronicity of condition, continued medical management, but has very strong family and financial support  PT Frequency 3x / week (plan to decrease to 2x/week after iniital 4 weeks)  PT Duration 8 weeks  PT Treatment/Interventions ADLs/Self Care Home Management;DME Instruction;Gait training;Stair training;Functional mobility training;Therapeutic activities;Patient/family education;Cognitive remediation;Neuromuscular re-education;Therapeutic exercise;Balance training;Orthotic Fit/Training;Wheelchair mobility training;Manual techniques;Splinting;Visual/perceptual  remediation/compensation;Vestibular;Passive range of motion;Electrical Stimulation  PT Next Visit Plan work on LE stretching as able, sitting balance (with second person), scooting, sit/stands (with and without standing frame)  PT Home Exercise Plan family/caregiver has ROM exercises from previous admissions  Consulted and Agree with Plan of Care Family member/caregiver;Patient  Family Member Consulted husband [name]          Patient will benefit from skilled therapeutic intervention in order to improve the following deficits and impairments:  Abnormal gait, Decreased balance, Decreased endurance, Decreased cognition, Cardiopulmonary status limiting activity, Decreased activity tolerance, Decreased coordination, Decreased strength, Impaired flexibility, Impaired tone, Decreased mobility, Increased muscle spasms, Impaired vision/preception, Decreased range of motion, Impaired UE functional use, Postural dysfunction, Improper body mechanics, Impaired perceived functional ability, Decreased knowledge of precautions, Decreased knowledge of use of DME  Visit Diagnosis: Abnormal posture  Muscle weakness (generalized)  Other symptoms and signs involving the nervous system  Other abnormalities of gait and mobility     Problem List Patient Active Problem List   Diagnosis Date Noted  . Pseudomonas urinary tract infection 10/27/2016  . Severe muscle deconditioning 09/30/2016  . Pressure injury of skin 09/28/2016  . Respiratory failure ([place])   . Tracheobronchitis   . Urinary retention   . PEG (percutaneous endoscopic gastrostomy) status ([place])   . Benign essential HTN   . Hyponatremia   . Hypokalemia   . Reactive hypertension   . Respiratory insufficiency 09/24/2016  . Malignant neoplasm of upper-inner quadrant of left breast in female, estrogen receptor positive ([place]) 07/14/2016  . Heterotopic ossification of bone 03/10/2016  . Acute on chronic respiratory failure with hypoxia ([place])    . Acute hypoxemic respiratory failure ([place])   . Acute blood loss anemia   . Atelectasis 02/04/2016  . Debility 02/03/2016  . Pneumonia of both lower lobes due to Pseudomonas species ([place])   . Pneumonia of both lower lobes due to methicillin susceptible Staphylococcus aureus (MSSA) ([place])   . Acute respiratory failure with hypoxia ([place])   . Acute encephalopathy   . Seizures ([place])   . Sepsis ([place])   . Hypoxia   . Pain   . HCAP (healthcare-associated pneumonia) 12/28/2015  . Chronic respiratory failure ([place]) 12/28/2015  . Acute tracheobronchitis 12/24/2015  . Tracheostomy tube present ([place])   . Tracheal stenosis   . Chronic respiratory failure with hypoxia ([place])   . Spastic tetraplegia ([place]) 10/20/2015  . Tracheostomy status ([place]) 09/26/2015  . Allergic rhinitis 09/26/2015  . 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 Movement disorder 09/22/2015  . Encephalitis 09/22/2015  . Chest pain 02/07/2014  . Premature atrial contractions 01/13/2014  . Abnormality of gait 12/04/2013  . Hyperlipidemia 12/21/2011  . Cardiovascular risk factor 12/21/2011  . Bunion 01/31/2008  . Metatarsalgia of both feet 09/20/2007  . FLAT FOOT 09/20/2007  . HALLUX RIGIDUS, ACQUIRED 09/20/2007    Willow Ora, PTA, [facility] [address] [place], [place] [address] [phone] 11/06/16, 11:15 PM  Name: EULANDA DORION MRN: [id number] Date of Birth: May 12, 1950

## 2016-11-09 ENCOUNTER — Other Ambulatory Visit: Payer: Self-pay | Admitting: Internal Medicine

## 2016-11-09 ENCOUNTER — Encounter: Payer: Self-pay | Admitting: Occupational Therapy

## 2016-11-09 ENCOUNTER — Ambulatory Visit: Payer: Medicare Other | Admitting: Occupational Therapy

## 2016-11-09 DIAGNOSIS — R293 Abnormal posture: Secondary | ICD-10-CM

## 2016-11-09 DIAGNOSIS — M25611 Stiffness of right shoulder, not elsewhere classified: Secondary | ICD-10-CM

## 2016-11-09 DIAGNOSIS — R945 Abnormal results of liver function studies: Principal | ICD-10-CM

## 2016-11-09 DIAGNOSIS — R29818 Other symptoms and signs involving the nervous system: Secondary | ICD-10-CM

## 2016-11-09 DIAGNOSIS — M25612 Stiffness of left shoulder, not elsewhere classified: Secondary | ICD-10-CM

## 2016-11-09 DIAGNOSIS — M25622 Stiffness of left elbow, not elsewhere classified: Secondary | ICD-10-CM

## 2016-11-09 DIAGNOSIS — R7989 Other specified abnormal findings of blood chemistry: Secondary | ICD-10-CM

## 2016-11-09 DIAGNOSIS — R41844 Frontal lobe and executive function deficit: Secondary | ICD-10-CM

## 2016-11-09 DIAGNOSIS — M6281 Muscle weakness (generalized): Secondary | ICD-10-CM

## 2016-11-09 DIAGNOSIS — R2689 Other abnormalities of gait and mobility: Secondary | ICD-10-CM | POA: Diagnosis not present

## 2016-11-09 NOTE — Therapy (Signed)
[facility] [address] [place], [place], [address] Phone: [phone]   Fax:  [phone]  Occupational Therapy Treatment  Patient Details  Name: Judy Spears MRN: [id number] Date of Birth: 07-03-1950 Referring Provider: Dr. [name]  Encounter Date: 11/09/2016      OT End of Session - 11/09/16 1615    Visit Number 2   Number of Visits 16   Date for OT Re-Evaluation 12/27/16   Authorization Type medicare will need PN and G code every 10th visit   Authorization Time Period 60 days   Authorization - Visit Number 2   Authorization - Number of Visits 10   OT Start Time 1320  pt arrived late   OT Stop Time 1359   OT Time Calculation (min) 39 min   Activity Tolerance Treatment limited secondary to medical complications (Comment)      Past Medical History:  Diagnosis Date  . Arthritis    lt great toe  . Complication of anesthesia    pt has had headaches post op-did advise to hydra well preop  . Hair loss    right-sided  . History of exercise stress test    a. ETT (12/15) with 12:00 exercise, no ST changes, occasional PACs.   . Hyperlipidemia   . Insomnia   . Migraine headache   . Movement disorder    resltess in left legs  . Postmenopausal   . 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 Premature atrial contractions    a. Holter (12/15) with 8% PACs, no atrial runs or atrial fibrillation.     Past Surgical History:  Procedure Laterality Date  . BREAST BIOPSY  01/01/2011   Procedure: BREAST BIOPSY WITH NEEDLE LOCALIZATION;  Surgeon: Rolm Bookbinder, MD;  Location: [facility];  Service: General;  Laterality: Left;  left breast wire localization biopsy  . BREAST BIOPSY Right 07/27/2012  . BREAST BIOPSY Right 07/07/2016  . BREAST BIOPSY Left 06/30/2016  . cataracts right eye    . CESAREAN SECTION  1986  . HERNIA REPAIR  2000   RIH  . IR [place] TUBE PERCUT W/FLUORO  09/29/2016  . opirectinal  membrane peel    . PEG PLACEMENT    . RHINOPLASTY  1983  . TRACHEOSTOMY      There were no vitals filed for this visit.      Subjective Assessment - 11/09/16 1552    Subjective  Pt moaning intermittently but does not appear to be due to pain.                      OT Treatments/Exercises (OP) - 11/09/16 0001      Neurological Re-education Exercises   Other Exercises 1 Neuro re ed to address forward weight shift, lateral weight shift to side leaning onto R elbow to assist pt in finding midline - pt with signficant L bias that appears to be multifactorial including abnormal righting reactions, increased tone, abnormal posturing and alignment of trunk (R ribcage rotated back with elongation on R and shortening on left).  Pt with much greater difficulty sitting EOM even after facilitation.  Pt appears to be also experiencing sudden spasms in trunk and LE''s leading to increased tone through out.  Pt total assist of 2 for mat to wheelchair transfers.  Pt will need additional wheelchair positioning given signficant clinic changes. Caregiver reports pt almost fell out of wheelchair while she was driving today. Discussed use of chest strap just  for transportation.  Wil notify PT to organize further wheelchair assessment using current tilt in space as base.                   OT Short Term Goals - 11/09/16 1612      OT SHORT TERM GOAL #1   Title Pt, caregiver and husband will be mod I with HEP - 11/29/2016   Status On-going     OT SHORT TERM GOAL #2   Title Pt will consistently require max assist x1 from caregiver or husband for basic transfers (currently total A x 2 for untrained health professional)   Status On-going     OT SHORT TERM GOAL #3   Title Pt will sit on level surfaces in preparation for participation in ADL tasks with intermittent min a and mod cues after preparation.   Status On-going     OT SHORT TERM GOAL #4   Title Pt will require no more than total  assist x1 from family/caregiver to use steady assist for basic transfers (not toilet or shower transfers).     Status On-going     OT SHORT TERM GOAL #5   Title Pt will demonstrate ability to follow simple, functional commands in context at least 50% of the time.    Status On-going           OT Long Term Goals - 11/09/16 1612      OT LONG TERM GOAL #1   Title Pt , husband and caregiver will be mod I with upgraded HEP prn - 12/27/2016   Status On-going     OT LONG TERM GOAL #2   Title Pt will will demonstrate ability to sit on even surface with supervision while engaging in simple activity   Status On-going     OT LONG TERM GOAL #3   Title Pt will be mod a for squat pivot basic transfers   Status On-going     OT LONG TERM GOAL #4   Title Pt will be mod a x1 to use steady for toilet transfers   Status On-going     OT LONG TERM GOAL #5   Title Pt will be max a x1 for clothing mgmt using steady for toilet transfers   Status On-going     OT LONG TERM GOAL #6   Title Pt will demonstrate ability to don shirt over both arms with set up.    Status On-going     OT LONG TERM GOAL #7   Title Pt will demonstrate ability to sit with supevision in wheelchair shower seat during shower.    Status On-going               Plan - 11/09/16 1613    Clinical Impression Statement Pt today with worsening L bias in sitting, decreased alertness, fear of movement and increased tone.  Caregiver aware and reports increasing difficulty in caring for pt at home.     Rehab Potential Fair   Current Impairments/barriers affecting progress: severity of deficits, very slow rate of recovery; pt with recent significant decline over past several months due medical complications   OT Frequency 2x / week   OT Duration 8 weeks   OT Treatment/Interventions Self-care/ADL training;Ultrasound;Moist Heat;Electrical Stimulation;DME and/or AE instruction;Neuromuscular education;Therapeutic exercise;Functional  Mobility Training;Manual Therapy;Passive range of motion;Splinting;Therapeutic activities;Balance training;Patient/family education;Visual/perceptual remediation/compensation;Cognitive remediation/compensation   Plan NMR for trunk, sitting balance, weight shifting, basic pt intiatied movement, wheelchair positioning   Consulted and Agree with Plan of  Care Patient;Family member/caregiver   Family Member Consulted caregiver Leesa      Patient will benefit from skilled therapeutic intervention in order to improve the following deficits and impairments:  Decreased endurance, Decreased skin integrity, Impaired vision/preception, Improper body mechanics, Impaired perceived functional ability, Decreased knowledge of precautions, Cardiopulmonary status limiting activity, Decreased activity tolerance, Decreased knowledge of use of DME, Decreased strength, Impaired flexibility, Improper spinal/pelvic alignment, Impaired sensation, Difficulty walking, Decreased mobility, Decreased balance, Decreased cognition, Decreased range of motion, Impaired tone, Impaired UE functional use, Decreased safety awareness, Decreased coordination, Pain, Abnormal gait  Visit Diagnosis: Abnormal posture  Muscle weakness (generalized)  Other symptoms and signs involving the nervous system  Stiffness of left shoulder, not elsewhere classified  Stiffness of right shoulder, not elsewhere classified  Stiffness of left elbow, not elsewhere classified  Frontal lobe and executive function deficit    Problem List Patient Active Problem List   Diagnosis Date Noted  . Pseudomonas urinary tract infection 10/27/2016  . Severe muscle deconditioning 09/30/2016  . Pressure injury of skin 09/28/2016  . Respiratory failure ([place])   . Tracheobronchitis   . Urinary retention   . PEG (percutaneous endoscopic gastrostomy) status ([place])   . Benign essential HTN   . Hyponatremia   . Hypokalemia   . Reactive hypertension   .  Respiratory insufficiency 09/24/2016  . Malignant neoplasm of upper-inner quadrant of left breast in female, estrogen receptor positive ([place]) 07/14/2016  . Heterotopic ossification of bone 03/10/2016  . Acute on chronic respiratory failure with hypoxia ([place])   . Acute hypoxemic respiratory failure ([place])   . Acute blood loss anemia   . Atelectasis 02/04/2016  . Debility 02/03/2016  . Pneumonia of both lower lobes due to Pseudomonas species ([place])   . Pneumonia of both lower lobes due to methicillin susceptible Staphylococcus aureus (MSSA) ([place])   . Acute respiratory failure with hypoxia ([place])   . Acute encephalopathy   . Seizures ([place])   . Sepsis ([place])   . Hypoxia   . Pain   . HCAP (healthcare-associated pneumonia) 12/28/2015  . Chronic respiratory failure ([place]) 12/28/2015  . Acute tracheobronchitis 12/24/2015  . Tracheostomy tube present ([place])   . Tracheal stenosis   . Chronic respiratory failure with hypoxia ([place])   . Spastic tetraplegia ([place]) 10/20/2015  . Tracheostomy status ([place]) 09/26/2015  . Allergic rhinitis 09/26/2015  . 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 Movement disorder 09/22/2015  . Encephalitis 09/22/2015  . Chest pain 02/07/2014  . Premature atrial contractions 01/13/2014  . Abnormality of gait 12/04/2013  . Hyperlipidemia 12/21/2011  . Cardiovascular risk factor 12/21/2011  . Bunion 01/31/2008  . Metatarsalgia of both feet 09/20/2007  . FLAT FOOT 09/20/2007  . HALLUX RIGIDUS, ACQUIRED 09/20/2007    Quay Burow, OTR/L 11/09/2016, 4:19 PM  [facility] [address] [place], [place], [address] Phone: [phone]   Fax:  [phone]  Name: Judy Spears MRN: [id number] Date of Birth: [birth year]

## 2016-11-10 ENCOUNTER — Ambulatory Visit (INDEPENDENT_AMBULATORY_CARE_PROVIDER_SITE_OTHER): Payer: Medicare Other | Admitting: Pulmonary Disease

## 2016-11-10 ENCOUNTER — Encounter: Payer: Self-pay | Admitting: Pulmonary Disease

## 2016-11-10 ENCOUNTER — Ambulatory Visit (INDEPENDENT_AMBULATORY_CARE_PROVIDER_SITE_OTHER)
Admission: RE | Admit: 2016-11-10 | Discharge: 2016-11-10 | Disposition: A | Discharge status: Home / Self Care | Payer: Medicare Other | Source: Ambulatory Visit | Attending: Pulmonary Disease | Admitting: Pulmonary Disease

## 2016-11-10 ENCOUNTER — Ambulatory Visit: Payer: Medicare Other | Admitting: Physical Therapy

## 2016-11-10 VITALS — BP 116/68 | HR 87

## 2016-11-10 DIAGNOSIS — R059 Cough, unspecified: Secondary | ICD-10-CM

## 2016-11-10 DIAGNOSIS — R293 Abnormal posture: Secondary | ICD-10-CM

## 2016-11-10 DIAGNOSIS — J9611 Chronic respiratory failure with hypoxia: Secondary | ICD-10-CM

## 2016-11-10 DIAGNOSIS — J209 Acute bronchitis, unspecified: Secondary | ICD-10-CM

## 2016-11-10 DIAGNOSIS — M6281 Muscle weakness (generalized): Secondary | ICD-10-CM

## 2016-11-10 DIAGNOSIS — R05 Cough: Secondary | ICD-10-CM

## 2016-11-10 DIAGNOSIS — R29818 Other symptoms and signs involving the nervous system: Secondary | ICD-10-CM

## 2016-11-10 DIAGNOSIS — Z930 Tracheostomy status: Secondary | ICD-10-CM | POA: Diagnosis not present

## 2016-11-10 DIAGNOSIS — R2689 Other abnormalities of gait and mobility: Secondary | ICD-10-CM | POA: Diagnosis not present

## 2016-11-10 MED ORDER — SULFAMETHOXAZOLE-TRIMETHOPRIM 800-160 MG PO TABS: 1.0000 | ORAL_TABLET | Freq: Two times a day (BID) | ORAL | 0 refills | Status: DC

## 2016-11-10 NOTE — Patient Instructions (Signed)
For tracheobronchitis: We are going to cover MRSA again with Bactrim double strengthtwice a day for 7 days Try to collect a sputum culture for [place] if you can We'll check a chest x-ray today We will check a kidney lab on Friday, October 19 Let us know if she has increasing lethargy fever, mucus production, or shortness of breath  We will have the respiratory department at [facility] set out three [employer] traps for her  We will see you back in 2 weeks with an NP to make sure things are going in the right direction

## 2016-11-10 NOTE — Progress Notes (Signed)
Subjective:    Patient ID: Judy Spears, female    DOB: [birth year], 66 y.o.   MRN: [id number]  Synopsis: In November she 2016 was bitten by a tick and then within 2 weeks had a progressive neurologic illness. Specifically she was found unresponsive by her husband while [place] in [place] of [place]. She was brought to local hospital where she required intubation due to unresponsiveness. Tracheostomy was performed on hospital day 2 and extensive workup for her neurologic illness begun. 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 She was then transferred for inpatient rehabilitation to the [facility] in [place]. While there she continued to participate in physical therapy and occupational therapy. However, during this time she was never able to consistently take by mouth and all nutrition has been provided the enteral feeding since around the time of her admission to the intensive care unit.  The tracheostomy has been in place since her original hospitalization in December 2016. It was briefly removed in May of 2017 but unfortunately she had a complication of laryngospasm and it sounds like the tracheostomy was replaced approximately 2 weeks later.  The tracheostomy was removed on 11/12/2015.  Not long afterwards she was hospitalized for healthcare associated pneumonia. This is a prolonged hospitalization complicated by encephalopathy and ultimately she ended up back in rehabilitation once again.  The tracheostomy was removed again in the spring of 2018. Shortly after this decannulation she required hospitalization once again for pneumonia.  Diagnosed with breast cancer and underwent lumpectomy under local anesthesia at [facility] summer 2018.  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  HPI Chief Complaint  Patient presents with    . Hospitalization Follow-up    recently hospitalized for increased secretions, fever.  Still producing thick yellow mucus.  denies fever.     Yesterday they noted a lot of brown secretions around the trach, much more than last week.  No fever.  Not really having more secretions.  Was last suctioned a week ago.  She is still coughing up more in to the back of her throat past the trach.  [name] suctioned her mouth a lot last night and noticed a large amount of mucus.  She had a little cough last night.  In general she has been more lethargic since coming home from the hospital, but not necessarily has seen anyone.  They are changing PCP to Lang Snow.  He is getting an ultrasound of her liver due to an elevated alkaline phos.    Had a flu shot from [place].    In general she is still very stiff in her legs, not talking much.  They think she is slowly improving.  No walking right now.  All nutrition is via tube feeding 59ml/hr, 20 hours a day.     Past Medical History:  Diagnosis Date  . Arthritis    lt great toe  . Complication of anesthesia    pt has had headaches post op-did advise to hydra well preop  . Hair loss    right-sided  . History of exercise stress test    a. ETT (12/15) with 12:00 exercise, no ST changes, occasional PACs.   . Hyperlipidemia   . Insomnia   . Migraine headache   . Movement disorder    resltess in left legs  . Postmenopausal   . 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  Premature atrial contractions    a. Holter (12/15) with 8% PACs, no atrial runs or atrial fibrillation.       Review of Systems     Objective:   Physical Exam Vitals:   11/10/16 0929  BP: 116/68  Pulse: 87  SpO2: 96%    Gen: chronically ill appearing in wheelchair HENT: tracheostomy tube in place PULM: CTA B, normal effort CV: RRR, no mgr, trace edema GI: BS+, soft, nontender Derm: no cyanosis or rash Neuro: sleepy but arouses to voice, smiles, follows commands, some tremor left arm and  leg    Records from her recent rehab stay reviewed where she was cared for spastic paraplegia post MRSA tracheobronchitis  CBC    Component Value Date/Time   WBC 7.4 10/19/2016 1524   RBC 4.89 10/19/2016 1524   HGB 13.7 10/19/2016 1524   HGB 13.1 09/08/2016 1245   HCT 42.6 10/19/2016 1524   HCT 40.7 09/08/2016 1245   PLT 383 10/19/2016 1524   PLT 363 09/08/2016 1245   MCV 87.1 10/19/2016 1524   MCV 90 09/08/2016 1245   MCH 28.0 10/19/2016 1524   MCHC 32.2 10/19/2016 1524   RDW 14.4 10/19/2016 1524   RDW 13.5 09/08/2016 1245   LYMPHSABS 1.2 10/01/2016 0639   MONOABS 0.9 10/01/2016 0639   EOSABS 0.2 10/01/2016 0639   BASOSABS 0.0 10/01/2016 0639   BMET    Component Value Date/Time   NA 138 10/21/2016 0716   NA 134 09/08/2016 1245   K 3.7 10/21/2016 0716   CL 97 (L) 10/21/2016 0716   CO2 32 10/21/2016 0716   GLUCOSE 110 (H) 10/21/2016 0716   BUN 20 10/21/2016 0716   BUN 10 09/08/2016 1245   CREATININE 0.35 (L) 10/21/2016 0716   CALCIUM 9.1 10/21/2016 0716   GFRNONAA >60 10/21/2016 0716   GFRAA >60 10/21/2016 0716         Assessment & Plan:   Cough - Plan: DG Chest 1 View, Respiratory or Resp and Sputum Culture, Basic Metabolic Panel (BMET)  Tracheostomy tube present ([place])  Chronic respiratory failure with hypoxia ([place])  Acute tracheobronchitis  Discussion: I am concerned loping tracheobronchitis again as her family has noticed an increase in secretions in the last few days. Otherwise she has been doing fine prior to this.  She needs a prevnar vaccination, best to do this in 2 weeks.  We've learned form previous episodes she needs to have a CXR to rule out pneumonia.  We'll cover MRSA as she has a history of infections with that in the past.  We'll also ask her family to collect a sputum culture for [place].    Plan: For tracheobronchitis: We are going to cover MRSA again with Bactrim double strengthtwice a day for 7 days Try to collect a sputum culture for [place]  if you can We'll check a chest x-ray today We will check a kidney lab on Friday, October 19 Let us know if she has increasing lethargy fever, mucus production, or shortness of breath  We will have the respiratory department at [facility] set out three [employer]en[employer] traps for her  We will see you back in 2 weeks with an NP to make sure things are going in the right direction > prevnar vaccine that visit    Current Outpatient Prescriptions:  .  amantadine (SYMMETREL) 50 MG/5ML solution, Place 10 mLs (100 mg total) into feeding tube 2 (two) times daily. In am and at noon (Patient taking differently: Place 150 mg  into feeding tube 2 (two) times daily. In am and at noon ), Disp: 473 mL, Rfl: 0 .  Amino Acids-Protein Hydrolys (FEEDING SUPPLEMENT, PRO-STAT SUGAR FREE 64,) LIQD, Place 30 mLs into feeding tube 2 (two) times daily., Disp: 1800 mL, Rfl: 0 .  amLODipine (NORVASC) 10 MG tablet, Place 1 tablet (10 mg total) into feeding tube daily., Disp: 30 tablet, Rfl: 11 .  atorvastatin (LIPITOR) 20 MG tablet, Place 20 mg into feeding tube at bedtime. , Disp: , Rfl:  .  baclofen (LIORESAL) 10 MG tablet, PLACE 1 TABLET INTO FEEDING TUBE EVERY 6HOURS, Disp: 120 each, Rfl: 0 .  baclofen (LIORESAL) 10 mg/mL SUSP, Place 1 mL (10 mg total) into feeding tube 4 (four) times daily., Disp: , Rfl:  .  bethanechol (URECHOLINE) 25 MG tablet, Place 1 tablet (25 mg total) into feeding tube 3 (three) times daily., Disp: 90 tablet, Rfl: 0 .  bisacodyl (DULCOLAX) 10 MG suppository, Place 1 suppository (10 mg total) rectally daily as needed for moderate constipation., Disp: 12 suppository, Rfl: 0 .  chlorhexidine (PERIDEX) 0.12 % solution, 64ml IN THE MOUTH OR THROAT TWICE DAILY, Disp: 473 mL, Rfl: 0 .  clotrimazole (LOTRIMIN) 1 % cream, Apply topically 2 (two) times daily., Disp: 113 g, Rfl: 0 .  gabapentin (NEURONTIN) 100 MG capsule, One capsule--open and administer via PEG twice a day., Disp: 60 capsule, Rfl: 0 .  guaiFENesin  (ROBITUSSIN) 100 MG/5ML SOLN, Place 10 mLs (200 mg total) into feeding tube 4 (four) times daily. (Patient taking differently: Place 10 mLs into feeding tube as needed for to loosen phlegm. ), Disp: 1200 mL, Rfl: 0 .  loratadine (CLARITIN) 10 MG tablet, Place 1 tablet (10 mg total) into feeding tube daily as needed for allergies. (Patient taking differently: Place 10 mg into feeding tube every morning. daily), Disp: , Rfl:  .  Melatonin 5 MG CAPS, Place 5 mg into feeding tube every evening. , Disp: , Rfl:  .  Multiple Vitamin (MULTIVITAMIN) LIQD, Place 15 mLs into feeding tube daily., Disp: 1 Bottle, Rfl: 3 .  Nutritional Supplements (FEEDING SUPPLEMENT, OSMOLITE 1.5 CAL,) LIQD, Place 1,000 mLs into feeding tube daily., Disp: , Rfl: 0 .  potassium chloride 20 MEQ/15ML (10%) SOLN, Place 15 mLs (20 mEq total) into feeding tube daily., Disp: 900 mL, Rfl: 5 .  primidone (MYSOLINE) 50 MG tablet, Place 1 tablet (50 mg total) into feeding tube 4 (four) times daily., Disp: 120 tablet, Rfl: 1 .  saccharomyces boulardii (FLORASTOR) 250 MG capsule, Place 1 capsule (250 mg total) into feeding tube 2 (two) times daily., Disp: 60 capsule, Rfl: 0 .  valACYclovir (VALTREX) 500 MG tablet, Take 500 mg by mouth daily., Disp: , Rfl:  .  Water For Irrigation, Sterile (FREE WATER) SOLN, Place 200 mLs into feeding tube every 8 (eight) hours., Disp: , Rfl:

## 2016-11-10 NOTE — Therapy (Signed)
[facility] [address] [place], [place], [address] Phone: [phone]   Fax:  [phone]  Physical Therapy Treatment  Patient Details  Name: Judy Spears MRN: [id number] Date of Birth: 09/19/1950 Referring Provider: Alger Simons, MD  Encounter Date: 11/10/2016      PT End of Session - 11/10/16 1552    Visit Number 4   Number of Visits 20   Date for PT Re-Evaluation 12/27/16   Authorization Type [employer] MCR   PT Start Time 8299   PT Stop Time 1401   PT Time Calculation (min) 44 min      Past Medical History:  Diagnosis Date  . Arthritis    lt great toe  . Complication of anesthesia    pt has had headaches post op-did advise to hydra well preop  . Hair loss    right-sided  . History of exercise stress test    a. ETT (12/15) with 12:00 exercise, no ST changes, occasional PACs.   . Hyperlipidemia   . Insomnia   . Migraine headache   . Movement disorder    resltess in left legs  . Postmenopausal   . 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 Premature atrial contractions    a. Holter (12/15) with 8% PACs, no atrial runs or atrial fibrillation.     Past Surgical History:  Procedure Laterality Date  . BREAST BIOPSY  01/01/2011   Procedure: BREAST BIOPSY WITH NEEDLE LOCALIZATION;  Surgeon: Rolm Bookbinder, MD;  Location: [facility];  Service: General;  Laterality: Left;  left breast wire localization biopsy  . BREAST BIOPSY Right 07/27/2012  . BREAST BIOPSY Right 07/07/2016  . BREAST BIOPSY Left 06/30/2016  . cataracts right eye    . CESAREAN SECTION  1986  . HERNIA REPAIR  2000   [facility]  . IR [place] TUBE PERCUT W/FLUORO  09/29/2016  . opirectinal membrane peel    . PEG PLACEMENT    . RHINOPLASTY  1983  . TRACHEOSTOMY      There were no vitals filed for this visit.      Subjective Assessment - 11/10/16 1410    Subjective Pt had appt this am with pulmonologist - pt groggy - making little  eye contact at beginning of session; [name] requests to weigh pt at end of session, if time allows   Patient is accompained by: --  Caregiver [name]   Pertinent History Precautions:  PEG.  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  Another round of botox in LLE on 02/06/16   Limitations House hold activities;Walking;Standing;Sitting;Lifting;Writing   Patient Stated Goals Per husband, "For Judy Spears to be as independent as possible."   Currently in Pain? No/denies                         [facility] Adult PT Treatment/Exercise - 11/10/16 1347      Bed Mobility   Bed Mobility Sit to Supine   Supine to Sit 1: +2 Total assist   Sit to Supine 1: +2 Total assist   Sit to Supine - Details (indicate cue type and reason) pt transferred sit to supine and supine to sit with total assist +2 :  pt unable to follow commands and unable to sit unsupported, therefore, 2 people performed this transfer for pt  for safety  Horizontal nystagmus noted upon initial supine position     Transfers   Transfers Sit to Stand   Sit to Stand 1: +2 Total assist   Sit to Stand Details Manual facilitation for weight shifting;Verbal cues for sequencing;Verbal cues for technique;Manual facilitation for weight bearing;Tactile cues for sequencing;Tactile cues for posture   Sit to Stand: Patient Percentage 10%   Comments Pt leans toward left side but has all of her weight on her Rt leg with very minimal to no weight on her LLE ; hips are flexed significantlly with pt unable to stand upright; LLE is flexed at knee     Static Sitting Balance   Static Sitting - Balance Support Right upper extremity supported  cues to bring Rt ear to shoulder to facilitate leaing to Rt   Static Sitting - Level of Assistance 2: Max assist     Neuro Re-ed     Neuro Re-ed Details  Performed sitting balance EOM with max to total assist as pt leans towrd left side; attempted to facilitate Lt trunk elongation with Rt anterior rotation of rib cage as pt has posterior rotation of Rt rib cage     Weighed pt (in wheelchair)  At end of session - pt weighs 98.3# -[name] reports this is 3# more than last week           PT Education - 11/10/16 1550    Education provided Yes   Education Details Informed [name] that we are in process of trying to schedule wheelchair consult with Deberah Pelton, ATP with [employer] for chest harness and for wheelchair modification due to onset of left lateral trunk lean   Person(s) Educated Patient   Methods Explanation   Comprehension Verbalized understanding          PT Short Term Goals - 10/28/16 1950      PT SHORT TERM GOAL #1   Title Perform sit to/from stand from wheelchair or mat with +1 mod assist.   Baseline max to total assist on 10-28-16   Time 4   Period Weeks   Status New   Target Date 11/27/16     PT SHORT TERM GOAL #2   Title Pt will perform sit to stand from wheelchair to RW with mod assist.   Baseline max to total assist - 10-28-16   Time 4   Period Weeks   Status New   Target Date 11/27/16     PT SHORT TERM GOAL #3   Title Pt will improve standing balance/trunk control so that caregiver [name]) is able to transfer pt using [place] without requiring a 2nd person for assist.     Baseline 2 people required with total assist - Charlaine Dalton is unable to be used at this time - 10-28-16   Time 4   Period Weeks   Status New   Target Date 11/27/16     PT SHORT TERM GOAL #4   Title Pt will sit with RUE support with mod assist unsupported on side of mat for at least 5" to demo improved trunk strength and core stabilization.   Baseline max assist needed on 10-28-16   Time 4   Period Weeks   Status New     PT SHORT TERM GOAL #5   Title Basic transfers from wheelchair to bed or commode with +1 mod assist.      Baseline max to total assist  - 10-28-16           PT  Long Term Goals - 10/28/16 2004      PT LONG TERM GOAL #1   Title Pt will perform supine rolling R and L with min assist  to indicate improved independence with bed mobility.     Baseline dependent for all bed mobility per spouse's report - 10-28-16   Time 8   Period Weeks   Status New   Target Date 12/27/16     PT LONG TERM GOAL #2   Title Pt will sit on side of mat with RUE support for at least 5" with min assist.   Baseline max assist on 10-28-16   Time 8   Period Weeks   Status New   Target Date 12/27/16     PT LONG TERM GOAL #3   Title Pt will transfer Lt/Rt sidelying to sitting with min assist for incr. independence with getting out of bed.     Baseline max to total assist needed - 10-28-16   Time 8   Period Weeks   Status New   Target Date 12/27/16     PT LONG TERM GOAL #4   Title Pt will perform basic transfers with min to mod assist to indicate improved functional independence.     Baseline max assist - 08-26-16   Time 8   Period Weeks   Status New   Target Date 12/27/16     PT LONG TERM GOAL #5   Title Pt will perform static standing with bil. UE support with mod assist.     Baseline pt able to stand in standing frame with mod assist to minimize deviations of shoulders leaning to left side with pelvis shifted to right side- 10-28-16   Time 8   Period Weeks   Status New   Target Date 12/27/16     PT LONG TERM GOAL #7   Title Perform sit to stand and stand to sit with mod assist.     Baseline max to total assist needed on 10-28-16   Time 8   Period Weeks   Status New               Plan - 11/10/16 1552    Clinical Impression Statement Pt continues to demonstrate abnormal movement patterns with postural deviations resulting in decreased sitting balance with inability to recover LOB in sitting.  Pt did not follow commands nor make eye contact at start of session - noted improvement with this during  final 10-15" of session.  In supine position pt groaning with passive LLE extension with perseverative movements noted with attempt to perform Lt hip flexion/extension.  Rt knee unable to be passively flexed beyond approx. 80 degrees with pt in reclined position.   Rehab Potential Fair   Clinical Impairments Affecting Rehab Potential Chronicity of condition, continued medical management, but has very strong family and financial support   PT Frequency 3x / week   PT Duration 8 weeks   PT Treatment/Interventions ADLs/Self Care Home Management;DME Instruction;Gait training;Stair training;Functional mobility training;Therapeutic activities;Patient/family education;Cognitive remediation;Neuromuscular re-education;Therapeutic exercise;Balance training;Orthotic Fit/Training;Wheelchair mobility training;Manual techniques;Splinting;Visual/perceptual remediation/compensation;Vestibular;Passive range of motion;Electrical Stimulation   PT Next Visit Plan cont to work on sitting balance, LE ROM as tolerated; partial squats as tolerated for LE weight-bearing   PT Home Exercise Plan family/caregiver has ROM exercises from previous admissions   Consulted and Agree with Plan of Care Family member/caregiver;Patient   Family Member Consulted husband [name]      Patient will benefit from skilled therapeutic intervention in order to improve  the following deficits and impairments:  Abnormal gait, Decreased balance, Decreased endurance, Decreased cognition, Cardiopulmonary status limiting activity, Decreased activity tolerance, Decreased coordination, Decreased strength, Impaired flexibility, Impaired tone, Decreased mobility, Increased muscle spasms, Impaired vision/preception, Decreased range of motion, Impaired UE functional use, Postural dysfunction, Improper body mechanics, Impaired perceived functional ability, Decreased knowledge of precautions, Decreased knowledge of use of DME  Visit Diagnosis: Abnormal  posture  Muscle weakness (generalized)  Other symptoms and signs involving the nervous system     Problem List Patient Active Problem List   Diagnosis Date Noted  . Pseudomonas urinary tract infection 10/27/2016  . Severe muscle deconditioning 09/30/2016  . Pressure injury of skin 09/28/2016  . Respiratory failure ([place])   . Tracheobronchitis   . Urinary retention   . PEG (percutaneous endoscopic gastrostomy) status ([place])   . Benign essential HTN   . Hyponatremia   . Hypokalemia   . Reactive hypertension   . Respiratory insufficiency 09/24/2016  . Malignant neoplasm of upper-inner quadrant of left breast in female, estrogen receptor positive ([place]) 07/14/2016  . Heterotopic ossification of bone 03/10/2016  . Acute on chronic respiratory failure with hypoxia ([place])   . Acute hypoxemic respiratory failure ([place])   . Acute blood loss anemia   . Atelectasis 02/04/2016  . Debility 02/03/2016  . Pneumonia of both lower lobes due to Pseudomonas species ([place])   . Pneumonia of both lower lobes due to methicillin susceptible Staphylococcus aureus (MSSA) ([place])   . Acute respiratory failure with hypoxia ([place])   . Acute encephalopathy   . Seizures ([place])   . Sepsis ([place])   . Hypoxia   . Pain   . HCAP (healthcare-associated pneumonia) 12/28/2015  . Chronic respiratory failure ([place]) 12/28/2015  . Acute tracheobronchitis 12/24/2015  . Tracheostomy tube present ([place])   . Tracheal stenosis   . Chronic respiratory failure with hypoxia ([place])   . 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 Tracheostomy status ([place]) 09/26/2015  . Allergic rhinitis 09/26/2015  . 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 Movement disorder 09/22/2015  . Encephalitis 09/22/2015  . Chest pain 02/07/2014  . Premature atrial contractions 01/13/2014  . Abnormality of gait 12/04/2013  . Hyperlipidemia 12/21/2011  . Cardiovascular risk factor 12/21/2011  . Bunion 01/31/2008  . Metatarsalgia of both feet  09/20/2007  . FLAT FOOT 09/20/2007  . HALLUX RIGIDUS, ACQUIRED 09/20/2007    Alda Lea, PT 11/10/2016, 5:06 PM  [facility] [address] [place], [place], [address] Phone: [phone]   Fax:  [phone]  Name: Judy Spears MRN: [id number] Date of Birth: [birth year]

## 2016-11-11 ENCOUNTER — Emergency Department (HOSPITAL_COMMUNITY): Payer: Medicare Other

## 2016-11-11 ENCOUNTER — Ambulatory Visit: Payer: Medicare Other | Admitting: Occupational Therapy

## 2016-11-11 ENCOUNTER — Emergency Department (HOSPITAL_COMMUNITY)
Admission: EM | Admit: 2016-11-11 | Discharge: 2016-11-11 | Disposition: A | Discharge status: Home / Self Care | Payer: Medicare Other | Attending: Emergency Medicine | Admitting: Emergency Medicine

## 2016-11-11 ENCOUNTER — Ambulatory Visit: Payer: Medicare Other | Admitting: Physical Therapy

## 2016-11-11 ENCOUNTER — Telehealth: Payer: Self-pay | Admitting: Internal Medicine

## 2016-11-11 DIAGNOSIS — Z8661 Personal history of infections of the central nervous system: Secondary | ICD-10-CM | POA: Insufficient documentation

## 2016-11-11 DIAGNOSIS — R293 Abnormal posture: Secondary | ICD-10-CM

## 2016-11-11 DIAGNOSIS — R0902 Hypoxemia: Secondary | ICD-10-CM

## 2016-11-11 DIAGNOSIS — Z79899 Other long term (current) drug therapy: Secondary | ICD-10-CM | POA: Diagnosis not present

## 2016-11-11 DIAGNOSIS — I10 Essential (primary) hypertension: Secondary | ICD-10-CM | POA: Diagnosis not present

## 2016-11-11 DIAGNOSIS — Z930 Tracheostomy status: Secondary | ICD-10-CM | POA: Insufficient documentation

## 2016-11-11 DIAGNOSIS — Z853 Personal history of malignant neoplasm of breast: Secondary | ICD-10-CM | POA: Diagnosis not present

## 2016-11-11 LAB — BASIC METABOLIC PANEL
ANION GAP: 14 (ref 5–15)
BUN: 18 mg/dL (ref 6–20)
CHLORIDE: 92 mmol/L — AB (ref 101–111)
CO2: 28 mmol/L (ref 22–32)
Calcium: 9.2 mg/dL (ref 8.9–10.3)
Creatinine, Ser: 0.31 mg/dL — ABNORMAL LOW (ref 0.44–1.00)
GFR calc Af Amer: >60 mL/min (ref >60)
GFR calc non Af Amer: >60 mL/min (ref >60)
Glucose, Bld: 81 mg/dL (ref 65–99)
Potassium: 4.3 mmol/L (ref 3.5–5.1)
SODIUM: 134 mmol/L — AB (ref 135–145)

## 2016-11-11 LAB — I-STAT CHEM 8, ED
BUN: 24 mg/dL — AB (ref 6–20)
CHLORIDE: 96 mmol/L — AB (ref 101–111)
Calcium, Ion: 1.1 mmol/L — ABNORMAL LOW (ref 1.15–1.40)
Creatinine, Ser: 0.4 mg/dL — ABNORMAL LOW (ref 0.44–1.00)
Glucose, Bld: 90 mg/dL (ref 65–99)
HEMATOCRIT: 46 % (ref 36.0–46.0)
Hemoglobin: 15.6 g/dL — ABNORMAL HIGH (ref 12.0–15.0)
Potassium: 4.2 mmol/L (ref 3.5–5.1)
SODIUM: 137 mmol/L (ref 135–145)
TCO2: 33 mmol/L — AB (ref 22–32)

## 2016-11-11 LAB — CBC
HCT: 43.6 % (ref 36.0–46.0)
HEMOGLOBIN: 14.2 g/dL (ref 12.0–15.0)
MCH: 28.9 pg (ref 26.0–34.0)
MCHC: 32.6 g/dL (ref 30.0–36.0)
MCV: 88.6 fL (ref 78.0–100.0)
Platelets: 333 10*3/uL (ref 150–400)
RBC: 4.92 MIL/uL (ref 3.87–5.11)
RDW: 17 % — ABNORMAL HIGH (ref 11.5–15.5)
WBC: 8.9 10*3/uL (ref 4.0–10.5)

## 2016-11-11 MED ORDER — ALBUTEROL SULFATE (2.5 MG/3ML) 0.083% IN NEBU: 5.0000 mg | INHALATION_SOLUTION | Freq: Once | RESPIRATORY_TRACT | Status: DC

## 2016-11-11 NOTE — Telephone Encounter (Signed)
[place] [facility] - Client [facility]  Patient Name: Judy Spears  DOB: August 14, 1950    Initial Comment Judy Spears was in yesterday with a patient, in therapy now, reading of oxygen is really low. Dr. Roselie Awkward [phone]   Nurse Assessment  Nurse: Dimas Chyle, RN, Dellis Filbert Date/Time Eilene Ghazi Time): 11/11/2016 1:08:53 PM  Confirm and document reason for call. If symptomatic, describe symptoms. ---Judy Spears was in yesterday with a patient, in therapy now, reading of oxygen is really low. PCP Dr. Roselie Awkward 72, 73, 76, and 82. Recently discharged from hospital for bronchitis.  Does the patient have any new or worsening symptoms? ---Yes  Will a triage be completed? ---Yes  Related visit to physician within the last 2 weeks? ---Yes  Does the PT have any chronic conditions? (i.e. diabetes, asthma, etc.) ---Yes  List chronic conditions. ---Lurline Idol  Is this a behavioral health or substance abuse call? ---No     Guidelines    Guideline Title Affirmed Question Affirmed Notes  Breathing Difficulty Patient sounds very sick or weak to the triager    Final Disposition User   Go to ED Now (or PCP triage) Dimas Chyle, RN, Dellis Filbert    Referrals  GO TO FACILITY UNDECIDED   Caller Disagree/Comply Comply  Caller Understands Yes  PreDisposition Call Doctor

## 2016-11-11 NOTE — ED Provider Notes (Signed)
[facility] EMERGENCY DEPARTMENT Provider Note   CSN: [id number] Arrival date & time: 11/11/16  1434     History   Chief Complaint Chief Complaint  Patient presents with  . Shortness of Breath    HPI Judy Spears is a 66 y.o. female.  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  Patient was noted to have oxygen saturation in 70s%.  She is also noted to be tachypneic.  No central cyanosis or altered mentation. Patient had indicated that she was not short of breath. Patient was suctioned with minimal white/clear secretions removed.  Her daughter state patient underwent a vest therapy session of 30 minutes this morning.  She sounded clear following the vest session and was not suctioned. Daughters state patient is much improved from when she was admitted 6 weeks ago for lethargy.  Patient was admitted 6 weeks ago for tracheobronchitis and completed 2 week course of doxycycline. She then was admitted to rehab.    The history is provided by a relative and medical records. No language interpreter was used.    Past Medical History:  Diagnosis Date  . Arthritis    lt great toe  . Complication of anesthesia    pt has had headaches post op-did advise to hydra well preop  . Hair loss    right-sided  . History of exercise stress test    a. ETT (12/15) with 12:00 exercise, no ST changes, occasional PACs.   . Hyperlipidemia   . Insomnia   . Migraine headache   . Movement disorder    resltess in left legs  . Postmenopausal   . 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 Premature atrial contractions    a. Holter (12/15) with 8% PACs, no atrial runs or atrial fibrillation.     Patient Active Problem List   Diagnosis Date Noted  . Pseudomonas urinary tract infection 10/27/2016  . Severe muscle deconditioning 09/30/2016  . Pressure injury of skin 09/28/2016  . Respiratory failure ([place])   . Tracheobronchitis   . Urinary retention   . PEG (percutaneous endoscopic gastrostomy) status ([place])   . Benign essential HTN   . Hyponatremia   . Hypokalemia   . Reactive hypertension   . Respiratory insufficiency 09/24/2016  . Malignant neoplasm of upper-inner quadrant of left breast in female, estrogen receptor positive ([place]) 07/14/2016  . Heterotopic ossification of bone 03/10/2016  . Acute on chronic respiratory failure with hypoxia ([place])   . Acute hypoxemic respiratory failure ([place])   . Acute blood loss anemia   . Atelectasis 02/04/2016  . Debility 02/03/2016  . Pneumonia of both lower lobes due to Pseudomonas species ([place])   . Pneumonia of both lower lobes due to methicillin susceptible Staphylococcus aureus (MSSA) ([place])   . Acute respiratory failure with hypoxia ([place])   . Acute encephalopathy   . Seizures ([place])   . Sepsis ([place])   . Hypoxia   . Pain   . HCAP (healthcare-associated pneumonia) 12/28/2015  . Chronic respiratory failure ([place]) 12/28/2015  . Acute tracheobronchitis 12/24/2015  . Tracheostomy tube present ([place])   . Tracheal stenosis   . Chronic respiratory failure with hypoxia ([place])   . 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 Tracheostomy status ([place]) 09/26/2015  . Allergic rhinitis 09/26/2015  .  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 Movement disorder 09/22/2015  . Encephalitis 09/22/2015  . Chest pain 02/07/2014  . Premature atrial contractions 01/13/2014  . Abnormality of gait 12/04/2013  . Hyperlipidemia 12/21/2011  . Cardiovascular risk factor 12/21/2011  . Bunion 01/31/2008  . Metatarsalgia of both feet 09/20/2007  . FLAT FOOT 09/20/2007  . HALLUX RIGIDUS, ACQUIRED 09/20/2007    Past Surgical History:  Procedure Laterality Date  . BREAST BIOPSY   01/01/2011   Procedure: BREAST BIOPSY WITH NEEDLE LOCALIZATION;  Surgeon: Rolm Bookbinder, MD;  Location: [facility];  Service: General;  Laterality: Left;  left breast wire localization biopsy  . BREAST BIOPSY Right 07/27/2012  . BREAST BIOPSY Right 07/07/2016  . BREAST BIOPSY Left 06/30/2016  . cataracts right eye    . CESAREAN SECTION  1986  . HERNIA REPAIR  2000   RIH  . IR [place] TUBE PERCUT W/FLUORO  09/29/2016  . opirectinal membrane peel    . PEG PLACEMENT    . RHINOPLASTY  1983  . TRACHEOSTOMY      OB History    No data available       Home Medications    Prior to Admission medications   Medication Sig Start Date End Date Taking? Authorizing Provider  amantadine (SYMMETREL) 50 MG/5ML solution Place 10 mLs (100 mg total) into feeding tube 2 (two) times daily. In am and at noon Patient taking differently: Place 150 mg into feeding tube 2 (two) times daily. In am and at noon  09/08/16   Meredith Staggers, MD  Amino Acids-Protein Hydrolys (FEEDING SUPPLEMENT, PRO-STAT SUGAR FREE 64,) LIQD Place 30 mLs into feeding tube 2 (two) times daily. 10/27/16   Love, Ivan Anchors, PA-C  amLODipine (NORVASC) 10 MG tablet Place 1 tablet (10 mg total) into feeding tube daily. 10/27/16 10/27/17  Love, Ivan Anchors, PA-C  atorvastatin (LIPITOR) 20 MG tablet Place 20 mg into feeding tube at bedtime.     [provider]  baclofen (LIORESAL) 10 MG tablet PLACE 1 TABLET INTO FEEDING TUBE EVERY 6HOURS 11/02/16   Meredith Staggers, MD  baclofen (LIORESAL) 10 mg/mL SUSP Place 1 mL (10 mg total) into feeding tube 4 (four) times daily. 09/30/16   Doreatha Lew, MD  bethanechol (URECHOLINE) 25 MG tablet Place 1 tablet (25 mg total) into feeding tube 3 (three) times daily. 10/27/16   Love, Ivan Anchors, PA-C  bisacodyl (DULCOLAX) 10 MG suppository Place 1 suppository (10 mg total) rectally daily as needed for moderate constipation. 09/30/16   Patrecia Pour, Christean Grief, MD  chlorhexidine  (PERIDEX) 0.12 % solution 66ml IN THE MOUTH OR THROAT TWICE DAILY 11/02/16   Meredith Staggers, MD  clotrimazole (LOTRIMIN) 1 % cream Apply topically 2 (two) times daily. 10/27/16   Love, Ivan Anchors, PA-C  gabapentin (NEURONTIN) 100 MG capsule One capsule--open and administer via PEG twice a day. 10/27/16   Love, Ivan Anchors, PA-C  guaiFENesin (ROBITUSSIN) 100 MG/5ML SOLN Place 10 mLs (200 mg total) into feeding tube 4 (four) times daily. Patient taking differently: Place 10 mLs into feeding tube as needed for to loosen phlegm.  02/25/16   Love, Ivan Anchors, PA-C  loratadine (CLARITIN) 10 MG tablet Place 1 tablet (10 mg total) into feeding tube daily as needed for allergies. Patient taking differently: Place 10 mg into feeding tube every morning. daily 02/25/16   Love, Ivan Anchors, PA-C  Melatonin 5 MG CAPS Place 5 mg into feeding tube every evening.  [provider]  Multiple Vitamin (MULTIVITAMIN) LIQD Place 15 mLs into feeding tube daily. 05/11/16   Donita Brooks, NP  Nutritional Supplements (FEEDING SUPPLEMENT, OSMOLITE 1.5 CAL,) LIQD Place 1,000 mLs into feeding tube daily. 09/30/16   Doreatha Lew, MD  potassium chloride 20 MEQ/15ML (10%) SOLN Place 15 mLs (20 mEq total) into feeding tube daily. 05/05/16   Meredith Staggers, MD  primidone (MYSOLINE) 50 MG tablet Place 1 tablet (50 mg total) into feeding tube 4 (four) times daily. 10/27/16   Love, Ivan Anchors, PA-C  saccharomyces boulardii (FLORASTOR) 250 MG capsule Place 1 capsule (250 mg total) into feeding tube 2 (two) times daily. 10/27/16   Love, Ivan Anchors, PA-C  sulfamethoxazole-trimethoprim (BACTRIM DS,SEPTRA DS) 800-160 MG tablet Take 1 tablet by mouth 2 (two) times daily. 11/10/16   Juanito Doom, MD  valACYclovir (VALTREX) 500 MG tablet Take 500 mg by mouth daily.    [provider]  Water For Irrigation, Sterile (FREE WATER) SOLN Place 200 mLs into feeding tube every 8 (eight) hours. 10/27/16   Bary Leriche, PA-C    Family  History Family History  Problem Relation Age of Onset  . Stroke Mother        paralyzed on right side/aphasia  . Hypertension Mother        had stroke after stopping BP meds  . Colon cancer Father   . Prostate cancer Father   . Heart attack Maternal Grandfather 56  . Stroke Maternal Uncle 57    Social History Social History  Substance Use Topics  . Smoking status: Never Smoker  . Smokeless tobacco: Never Used  . Alcohol use 0.0 oz/week     Comment: Occasional.     Allergies   Cefepime; Tetracyclines & related; Doxycycline monohydrate; Pollen extract; and Tetracycline   Review of Systems Review of Systems  Unable to perform ROS: Patient nonverbal     Physical Exam Updated Vital Signs BP (!) 136/98   Pulse 86   Temp (!) 97.4 F (36.3 C) (Oral)   Resp (!) 37   LMP 12/01/2010   SpO2 97%   Physical Exam  Constitutional: She appears well-developed.  HENT:  Head: Normocephalic and atraumatic.  Neck: Neck supple.  Uncuffed trach in place c/d/i  Cardiovascular: Normal rate and normal heart sounds.   Pulmonary/Chest: Effort normal. No respiratory distress. She has no wheezes.  Abdominal: Soft. She exhibits no distension. There is no tenderness. There is no guarding.  PEG tube c/d/i  Musculoskeletal: She exhibits no deformity.  Chronic contracture LUE. Tremor of RUE  Neurological: She is alert.  Alert, Able to nod/shake head to yes or no questions. Chronic deconditioning and diffuse atrophy  Nursing note and vitals reviewed.    ED Treatments / Results  Labs (all labs ordered are listed, but only abnormal results are displayed) Labs Reviewed  CBC - Abnormal; Notable for the following:       Result Value   RDW 17.0 (*)    All other components within normal limits  BASIC METABOLIC PANEL - Abnormal; Notable for the following:    Sodium 134 (*)    Chloride 92 (*)    Creatinine, Ser 0.31 (*)    All other components within normal limits  I-STAT CHEM 8, ED -  Abnormal; Notable for the following:    Chloride 96 (*)    BUN 24 (*)    Creatinine, Ser 0.40 (*)    Calcium, Ion 1.10 (*)  TCO2 33 (*)    Hemoglobin 15.6 (*)    All other components within normal limits  CULTURE, RESPIRATORY (NON-EXPECTORATED)    EKG  EKG Interpretation  Date/Time:  Thursday November 11 2016 14:43:51 EDT Ventricular Rate:  83 PR Interval:  114 QRS Duration: 76 QT Interval:  372 QTC Calculation: 437 R Axis:   69 Text Interpretation:  Sinus rhythm with Premature atrial complexes Right atrial enlargement Borderline ECG Poor data quality Confirmed by Dorie Rank [phone]) on 11/11/2016 5:24:38 PM       Radiology Dg Chest 1 View  Result Date: 11/10/2016 CLINICAL DATA:  Two days of chest congestion.  History of pneumonia. EXAM: CHEST 1 VIEW COMPARISON:  PA and lateral chest x-ray of October 25, 2016 FINDINGS: The lungs are reasonably well inflated. Linear increased density persists at both bases. There is no discrete alveolar infiltrate and no pleural effusion or pneumothorax. The heart and pulmonary vascularity are normal. The mediastinum is normal in width. The tracheostomy tube tip projects at the superior margin of the clavicular heads. The G-tube is in reasonable position. IMPRESSION: Stable subsegmental atelectasis or scarring at both bases. No acute pneumonia. No CHF. Electronically Signed   By: David  Martinique M.D.   On: 11/10/2016 10:44   Dg Chest Portable 1 View  Result Date: 11/11/2016 CLINICAL DATA:  66 year old female with desaturation, possible mucous plugging. EXAM: PORTABLE CHEST 1 VIEW COMPARISON:  11/10/2016 and earlier. FINDINGS: Portable AP semi upright view at 1510 hours. Stable tracheostomy tube. Stable lung volumes. Curvilinear opacity at the medial left lung base is unchanged since yesterday. Allowing for portable technique the lungs are otherwise clear. No pneumothorax or pleural effusion. Stable cardiac size and mediastinal contours. Partially  visible percutaneous gastric tube. Negative visible bowel gas pattern. IMPRESSION: Stable ventilation since yesterday. Mild curvilinear atelectasis at the medial left lung base. Electronically Signed   By: Genevie Ann M.D.   On: 11/11/2016 15:25    Procedures Procedures (including critical care time)  Medications Ordered in ED Medications - No data to display   Initial Impression / Assessment and Plan / ED Course  I have reviewed the triage vital signs and the nursing notes.  Pertinent labs & imaging results that were available during my care of the patient were reviewed by me and considered in my medical decision making (see chart for details).     66 year old female h/o Powass[place] encephalitis with complicated course, PEG/trach dependent, diagnosis of breast cancer in 06/2016 s/p lumpectomy and started on anastrozole with subsequent weakness and difficulty with ambulation who presents with daughters from rehab session for hypoxia and increased work of breathing. Initial exam SpO2>90% on RA. Mildly tachypneic. Lungs CTAB. Patient indicating at bedside that she does not feel SOB or have pain.  CXR showing stable ventilation. CBC, BMP unremarkable. Respiratory culture sent. Pt remains HDS on RA. Possible hypoxia from intermittent mucus plugging vs. inaccurate O2 monitor. Family understanding and attempting all measures to previous frequent hospital visits. Pt stable for continued outpatient followup.  Return precautions provided for worsening symptoms. Pt will f/u with PCP at first availability. Family verbalized agreement with plan.  Pt care d/w Dr. Tomi Bamberger  Final Clinical Impressions(s) / ED Diagnoses   Final diagnoses:  Oxygen desaturation    New Prescriptions Discharge Medication List as of 11/11/2016  5:56 PM       Fanta Wimberley, Pilar Plate, MD 11/12/16 8003    Dorie Rank, MD 11/13/16 0700

## 2016-11-11 NOTE — Discharge Instructions (Signed)
Please continue vest therapy as tolerated. Suction as needed. Followup with primary doctor at first availability

## 2016-11-11 NOTE — ED Notes (Signed)
Changed pt's clothes and linens/cleaned pt.

## 2016-11-11 NOTE — ED Notes (Addendum)
Respiratory at bedside to suction pt. Xray at bedside. RN attempted to draw labs, phlebotomy called to assist.

## 2016-11-11 NOTE — ED Triage Notes (Signed)
Pt arrives via POv from home with trach and episode of hypoxia just prior to PT this after. Per family sats were in the 70s/80s before suctioning, couldn't get good o2 saturation reading so come to ED for further eval. Lung sounds CTA. sats 100s in triage. VSS. Pt appears at baseline since recovering from suctioning today. Just started bactrim yesterday for green sputum. Denies recent fever.

## 2016-11-11 NOTE — Therapy (Signed)
[facility] [address] [place], [place], [address] Phone: [phone]   Fax:  [phone]  Occupational Therapy Treatment  Patient Details  Name: Judy Spears MRN: [id number] Date of Birth: 1950/05/06 Referring Provider: Dr. [name]  Encounter Date: 11/11/2016      OT End of Session - 11/11/16 1329    Visit Number 2   Number of Visits 16   Date for OT Re-Evaluation 12/27/16   Authorization Type medicare will need PN and G code every 10th visit   Authorization Time Period 60 days   Authorization - Visit Number 2   Authorization - Number of Visits 10   OT Start Time 5366   OT Stop Time 1325   OT Time Calculation (min) 50 min   Activity Tolerance Treatment limited secondary to medical complications (Comment)  session terminated due to low O2 Sats      Past Medical History:  Diagnosis Date  . Arthritis    lt great toe  . Complication of anesthesia    pt has had headaches post op-did advise to hydra well preop  . Hair loss    right-sided  . History of exercise stress test    a. ETT (12/15) with 12:00 exercise, no ST changes, occasional PACs.   . Hyperlipidemia   . Insomnia   . Migraine headache   . Movement disorder    resltess in left legs  . Postmenopausal   . 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 Premature atrial contractions    a. Holter (12/15) with 8% PACs, no atrial runs or atrial fibrillation.     Past Surgical History:  Procedure Laterality Date  . BREAST BIOPSY  01/01/2011   Procedure: BREAST BIOPSY WITH NEEDLE LOCALIZATION;  Surgeon: Rolm Bookbinder, MD;  Location: [facility];  Service: General;  Laterality: Left;  left breast wire localization biopsy  . BREAST BIOPSY Right 07/27/2012  . BREAST BIOPSY Right 07/07/2016  . BREAST BIOPSY Left 06/30/2016  . cataracts right eye    . CESAREAN SECTION  1986  . HERNIA REPAIR  2000   RIH  . IR [place] TUBE PERCUT W/FLUORO   09/29/2016  . opirectinal membrane peel    . PEG PLACEMENT    . RHINOPLASTY  1983  . TRACHEOSTOMY      There were no vitals filed for this visit.      Subjective Assessment - 11/11/16 1238    Pertinent History (P)  see epic snapshot - ABI/encephalitis/hypoxia from virus; recent hospitalization due to MRSA PNA    Patient Stated Goals (P)  Pt unable to state.                       OT Treatments/Exercises (OP) - 11/11/16 0001      ADLs   ADL Comments Pt arrived today with signficant congestion and attempting to cough. Pt with poor coloring.  Pt with O2 Sat readings of 73, 76, 82 and 83 using two diffierent Oz Sat monitors (both finger monitors). Pt clammy to the touch and intermittentlly moaning.  Does not appear in great distress at this time. Recommended pt's caregiver contact pulmonologist and caregiver called from this clinic.  Session terminated  Session terminated.  MD office recommended that pt go to ED and caregiver contacted dtr to meet her and will transport pt to ED.                   OT Short  Term Goals - 11/11/16 1328      OT SHORT TERM GOAL #1   Title Pt, caregiver and husband will be mod I with HEP - 11/29/2016   Status On-going     OT SHORT TERM GOAL #2   Title Pt will consistently require max assist x1 from caregiver or husband for basic transfers (currently total A x 2 for untrained health professional)   Status On-going     OT SHORT TERM GOAL #3   Title Pt will sit on level surfaces in preparation for participation in ADL tasks with intermittent min a and mod cues after preparation.   Status On-going     OT SHORT TERM GOAL #4   Title Pt will require no more than total assist x1 from family/caregiver to use steady assist for basic transfers (not toilet or shower transfers).     Status On-going     OT SHORT TERM GOAL #5   Title Pt will demonstrate ability to follow simple, functional commands in context at least 50% of the time.    Status  On-going           OT Long Term Goals - 11/11/16 1328      OT LONG TERM GOAL #1   Title Pt , husband and caregiver will be mod I with upgraded HEP prn - 12/27/2016   Status On-going     OT LONG TERM GOAL #2   Title Pt will will demonstrate ability to sit on even surface with supervision while engaging in simple activity   Status On-going     OT LONG TERM GOAL #3   Title Pt will be mod a for squat pivot basic transfers   Status On-going     OT LONG TERM GOAL #4   Title Pt will be mod a x1 to use steady for toilet transfers   Status On-going     OT LONG TERM GOAL #5   Title Pt will be max a x1 for clothing mgmt using steady for toilet transfers   Status On-going     OT LONG TERM GOAL #6   Title Pt will demonstrate ability to don shirt over both arms with set up.    Status On-going     OT LONG TERM GOAL #7   Title Pt will demonstrate ability to sit with supevision in wheelchair shower seat during shower.    Status On-going             Patient will benefit from skilled therapeutic intervention in order to improve the following deficits and impairments:     Visit Diagnosis: Abnormal posture    Problem List Patient Active Problem List   Diagnosis Date Noted  . Pseudomonas urinary tract infection 10/27/2016  . Severe muscle deconditioning 09/30/2016  . Pressure injury of skin 09/28/2016  . Respiratory failure ([place])   . Tracheobronchitis   . Urinary retention   . PEG (percutaneous endoscopic gastrostomy) status ([place])   . Benign essential HTN   . Hyponatremia   . Hypokalemia   . Reactive hypertension   . Respiratory insufficiency 09/24/2016  . Malignant neoplasm of upper-inner quadrant of left breast in female, estrogen receptor positive ([place]) 07/14/2016  . Heterotopic ossification of bone 03/10/2016  . Acute on chronic respiratory failure with hypoxia ([place])   . Acute hypoxemic respiratory failure ([place])   . Acute blood loss anemia   . Atelectasis  02/04/2016  . Debility 02/03/2016  . Pneumonia of both lower lobes due to  Pseudomonas species ([place])   . Pneumonia of both lower lobes due to methicillin susceptible Staphylococcus aureus (MSSA) ([place])   . Acute respiratory failure with hypoxia ([place])   . Acute encephalopathy   . Seizures ([place])   . Sepsis ([place])   . Hypoxia   . Pain   . HCAP (healthcare-associated pneumonia) 12/28/2015  . Chronic respiratory failure ([place]) 12/28/2015  . Acute tracheobronchitis 12/24/2015  . Tracheostomy tube present ([place])   . Tracheal stenosis   . Chronic respiratory failure with hypoxia ([place])   . 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 Tracheostomy status ([place]) 09/26/2015  . Allergic rhinitis 09/26/2015  . 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 Movement disorder 09/22/2015  . Encephalitis 09/22/2015  . Chest pain 02/07/2014  . Premature atrial contractions 01/13/2014  . Abnormality of gait 12/04/2013  . Hyperlipidemia 12/21/2011  . Cardiovascular risk factor 12/21/2011  . Bunion 01/31/2008  . Metatarsalgia of both feet 09/20/2007  . FLAT FOOT 09/20/2007  . HALLUX RIGIDUS, ACQUIRED 09/20/2007    Quay Burow, OTR/L 11/11/2016, 1:30 PM  [facility] [address] [place], [place], [address] Phone: [phone]   Fax:  [phone]  Name: Judy Spears MRN: [id number] Date of Birth: September 02, 1950

## 2016-11-11 NOTE — ED Provider Notes (Signed)
Pt presented to the ED for evaluation of respiratory difficulty.  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    Pt was getting therapy today when she became hypoxic.  It was checked several times and family attempted suctioning without success.  Since arriving to the ED her hypoxia has resolved.  Still tachypneic on my exam but not hypoxia.  Few ronchi on my exam.  Will check labs and CXR.    Continue to monitor.  ED workup reassuring with the exception of the tachypnea.  Likely related to her debilitated state.  Findings were discussed with family.  They feel comfortable going home.  They would like to avoid any unnecessary hospitalizations.   I saw and evaluated the patient, reviewed the resident's note and I agree with the findings and plan.   EKG Interpretation  Date/Time:  Thursday November 11 2016 14:43:51 EDT Ventricular Rate:  83 PR Interval:  114 QRS Duration: 76 QT Interval:  372 QTC Calculation: 437 R Axis:   69 Text Interpretation:  Sinus rhythm with Premature atrial complexes Right atrial enlargement Borderline ECG Poor data quality Confirmed by Dorie Rank [phone]) on 11/11/2016 5:24:38 PM         Dorie Rank, MD 11/13/16 [phone]

## 2016-11-11 NOTE — Progress Notes (Signed)
Sputum sample obtained and sent to lab by RT. 

## 2016-11-12 ENCOUNTER — Other Ambulatory Visit: Payer: Self-pay

## 2016-11-14 LAB — CULTURE, RESPIRATORY: CULTURE: NORMAL

## 2016-11-14 LAB — CULTURE, RESPIRATORY W GRAM STAIN

## 2016-11-15 ENCOUNTER — Inpatient Hospital Stay
Admission: RE | Admit: 2016-11-15 | Discharge: 2016-11-15 | Disposition: A | Discharge status: Home / Self Care | Payer: Self-pay | Source: Ambulatory Visit | Attending: Internal Medicine | Admitting: Internal Medicine

## 2016-11-15 ENCOUNTER — Ambulatory Visit
Admission: RE | Admit: 2016-11-15 | Discharge: 2016-11-15 | Disposition: A | Discharge status: Home / Self Care | Payer: Medicare Other | Source: Ambulatory Visit | Attending: Internal Medicine | Admitting: Internal Medicine

## 2016-11-15 ENCOUNTER — Other Ambulatory Visit: Payer: Self-pay | Admitting: Internal Medicine

## 2016-11-15 ENCOUNTER — Ambulatory Visit: Payer: Medicare Other | Admitting: Speech Pathology

## 2016-11-15 DIAGNOSIS — R945 Abnormal results of liver function studies: Principal | ICD-10-CM

## 2016-11-15 DIAGNOSIS — R471 Dysarthria and anarthria: Secondary | ICD-10-CM

## 2016-11-15 DIAGNOSIS — R7989 Other specified abnormal findings of blood chemistry: Secondary | ICD-10-CM

## 2016-11-15 DIAGNOSIS — R1312 Dysphagia, oropharyngeal phase: Secondary | ICD-10-CM

## 2016-11-15 DIAGNOSIS — R41841 Cognitive communication deficit: Secondary | ICD-10-CM

## 2016-11-15 DIAGNOSIS — R2689 Other abnormalities of gait and mobility: Secondary | ICD-10-CM | POA: Diagnosis not present

## 2016-11-15 NOTE — Therapy (Signed)
[facility] [address] [place], [place], [address] Phone: [phone]   Fax:  [phone]  Speech Language Pathology Treatment  Patient Details  Name: Judy Spears MRN: [id number] Date of Birth: 1950-02-24 Referring Provider: Dr. Quita Skye. [name]  Encounter Date: 11/15/2016      End of Session - 11/15/16 1554    Visit Number 2   Number of Visits 17   Date for SLP Re-Evaluation 01/05/17   SLP Start Time 1450  pt arrived 5 min late   SLP Stop Time  1532   SLP Time Calculation (min) 42 min   Activity Tolerance Patient tolerated treatment well;Patient limited by lethargy      Past Medical History:  Diagnosis Date  . Arthritis    lt great toe  . Complication of anesthesia    pt has had headaches post op-did advise to hydra well preop  . Hair loss    right-sided  . History of exercise stress test    a. ETT (12/15) with 12:00 exercise, no ST changes, occasional PACs.   . Hyperlipidemia   . Insomnia   . Migraine headache   . Movement disorder    resltess in left legs  . Postmenopausal   . 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 Premature atrial contractions    a. Holter (12/15) with 8% PACs, no atrial runs or atrial fibrillation.     Past Surgical History:  Procedure Laterality Date  . BREAST BIOPSY  01/01/2011   Procedure: BREAST BIOPSY WITH NEEDLE LOCALIZATION;  Surgeon: Rolm Bookbinder, MD;  Location: [facility];  Service: General;  Laterality: Left;  left breast wire localization biopsy  . BREAST BIOPSY Right 07/27/2012  . BREAST BIOPSY Right 07/07/2016  . BREAST BIOPSY Left 06/30/2016  . cataracts right eye    . CESAREAN SECTION  1986  . HERNIA REPAIR  2000   RIH  . IR [place] TUBE PERCUT W/FLUORO  09/29/2016  . opirectinal membrane peel    . PEG PLACEMENT    . RHINOPLASTY  1983  . TRACHEOSTOMY      There were no vitals filed for this visit.      Subjective Assessment -  11/15/16 1536    Subjective Pt alert, smiling.   Patient is accompained by: Family member   Special Tests husband [name], caregiver [name]   Currently in Pain? No/denies               ADULT SLP TREATMENT - 11/15/16 1450      General Information   Behavior/Cognition Cooperative;Distractible;Requires cueing;Pleasant mood;Lethargic   Patient Positioning Upright in chair   Oral care provided N/A     Treatment Provided   Treatment provided Cognitive-Linquistic     Pain Assessment   Pain Assessment No/denies pain     Cognitive-Linquistic Treatment   Treatment focused on Voice;Dysarthria   Skilled Treatment Pt limited today by lethargy, required cues (verbal, tactile) for attention and alertness every 1-2 minutes. Targeted lip seal, swallow initiation to improve articulatory movements. Pt required usual mod  A verbal and occasional tactile cues to swallow secretions in 8/8 opportunities. With inspiratory trainer, pt attained sufficient lip closure/ seal in 5/10 trials with usual mod-max A. Followed cues for inhalation in 3/10 trials. After initial success, pt was unable to continue attempts despite max cues due to change in alertness/attention. Initial attempts at phonation were limited by pt's decreased sustained attention. With change of environment (outdoors), pt's attention and alertness improved significantly.  Remaining 15 minutes of session targeted automatic speech and word-level tasks. Pt counted numbers 1-10, days of the week and months of the year with usual mod A for weak phonation. In word level game, pt relayed an animal whispered to her by her husband to SLP and caregiver with usual mod-max A to clear saliva with dry swallow, coordination of respiration/phonation, increased vocal intensity and amplitude of articulatory movements.      Assessment / Recommendations / Plan   Plan Continue with current plan of care     Progression Toward Goals   Progression toward goals  Progressing toward goals          SLP Education - 11/15/16 1555    Education provided Yes   Education Details word level guessing games for home, ice chips OK after oral care   Person(s) Educated Patient   Methods Explanation   Comprehension Verbalized understanding          SLP Short Term Goals - 11/15/16 1538      SLP SHORT TERM GOAL #1   Title Pt will demonstrate inspiratory and blowing exercises to increase respiratory support and coordination for vocalizations with usual mod A   Time 4   Period Weeks   Status On-going     SLP SHORT TERM GOAL #2   Title Pt will achieve audible phonation with prolonged vowels and/or CV syllables in 75% of trials with usual mod A.    Time 4   Period Weeks   Status On-going     SLP SHORT TERM GOAL #3   Title Pt will coordinate respiration/phonation for imitation of single words in 75% of trials with usual mod A.    Time 4   Period Weeks   Status On-going     SLP SHORT TERM GOAL #4   Title Pt will sustain attention to therapy tasks for 7 minutes with mod A multimodal cues.    Time 4   Period Weeks   Status On-going     SLP SHORT TERM GOAL #5   Title Pt will participate in thorough oral care regimen at least 4 times per day and prior to trials of ice chips per caregiver report over 3 sessions.   Baseline 10.22.18   Time 4   Period Weeks   Status On-going          SLP Long Term Goals - 11/15/16 1553      SLP LONG TERM GOAL #1   Title Pt will demonstrate cough/swallow for secretion management with min A from caregiver over 5 sessions.   Time 8   Period Weeks   Status On-going     SLP LONG TERM GOAL #2   Title Pt will demonstrate audible phonation/intelligible speech for 2 word phrases with usual min A in 7/10 trials.   Time 8   Period Weeks   Status On-going     SLP LONG TERM GOAL #3   Title Pt will communicate a basic want/ need by any functional means, including AAC, with mod A from caregivers over 3 sessions.    Time 8   Period Weeks   Status On-going     SLP LONG TERM GOAL #4   Title Pt will demonstrate appearance of timely swallow/ functional oral manipulation of ice chips in 4/5 trials with usual mod A.    Time 8   Period Weeks   Status On-going          Plan - 11/15/16 1557  Clinical Impression Statement Patient presents with ongoing deficits in speech intelligibility (dysarthria, reduced breath support), cognitive communication and swallowing. Pt sustaining alertness/attention initially 1-2 minutes, requiring frequent verbal, tactile cues. Changing therapy environment did improve pt's attention/arousal, with cues required ~every 5 minutes. In automatic speech and single word imitation tasks today, pt required usual mod A. Recommend skilled ST to address pt's decline in communication and swallowing  in order to maximize functional communication, secretion management/ swallowing safety, reduce caregiver burden and improve quality of life.   Speech Therapy Frequency 2x / week   Treatment/Interventions Aspiration precaution training;Trials of upgraded texture/liquids;Compensatory strategies;Functional tasks;Patient/family education;Cueing hierarchy;Diet toleration management by SLP;Cognitive reorganization;Compensatory techniques;SLP instruction and feedback;Internal/external aids;Multimodal communcation approach;Pharyngeal strengthening exercises   Potential to Achieve Goals Fair   Potential Considerations Ability to learn/carryover information;Co-morbidities;Severity of impairments;Cooperation/participation level;Previous level of function;Pain level   SLP Home Exercise Plan word games, ice chips after oral care   Consulted and Agree with Plan of Care Patient;Family member/caregiver   Family Member Consulted caregiver leesa, husband chip      Patient will benefit from skilled therapeutic intervention in order to improve the following deficits and impairments:   Dysarthria and  anarthria  Cognitive communication deficit  Dysphagia, oropharyngeal phase    Problem List Patient Active Problem List   Diagnosis Date Noted  . Pseudomonas urinary tract infection 10/27/2016  . Severe muscle deconditioning 09/30/2016  . Pressure injury of skin 09/28/2016  . Respiratory failure ([place])   . Tracheobronchitis   . Urinary retention   . PEG (percutaneous endoscopic gastrostomy) status ([place])   . Benign essential HTN   . Hyponatremia   . Hypokalemia   . Reactive hypertension   . Respiratory insufficiency 09/24/2016  . Malignant neoplasm of upper-inner quadrant of left breast in female, estrogen receptor positive ([place]) 07/14/2016  . Heterotopic ossification of bone 03/10/2016  . Acute on chronic respiratory failure with hypoxia ([place])   . Acute hypoxemic respiratory failure ([place])   . Acute blood loss anemia   . Atelectasis 02/04/2016  . Debility 02/03/2016  . Pneumonia of both lower lobes due to Pseudomonas species ([place])   . Pneumonia of both lower lobes due to methicillin susceptible Staphylococcus aureus (MSSA) ([place])   . Acute respiratory failure with hypoxia ([place])   . Acute encephalopathy   . Seizures ([place])   . Sepsis ([place])   . Hypoxia   . Pain   . HCAP (healthcare-associated pneumonia) 12/28/2015  . Chronic respiratory failure ([place]) 12/28/2015  . Acute tracheobronchitis 12/24/2015  . Tracheostomy tube present ([place])   . Tracheal stenosis   . Chronic respiratory failure with hypoxia ([place])   . Spastic tetraplegia ([place]) 10/20/2015  . Tracheostomy status ([place]) 09/26/2015  . Allergic rhinitis 09/26/2015  . 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 Movement disorder 09/22/2015  . Encephalitis 09/22/2015  . Chest pain 02/07/2014  . Premature atrial contractions 01/13/2014  . Abnormality of gait 12/04/2013  . Hyperlipidemia 12/21/2011  . Cardiovascular risk factor 12/21/2011  . Bunion 01/31/2008  . Metatarsalgia of both feet 09/20/2007  . FLAT FOOT  09/20/2007  . HALLUX RIGIDUS, ACQUIRED 09/20/2007   Deneise Lever, [place], [place] Speech-Language Pathologist  Aliene Altes 11/15/2016, 4:02 PM  [facility] [address] [place], [place], [address] Phone: [phone]   Fax:  [phone]   Name: Judy Spears MRN: [id number] Date of Birth: 09/20/1950

## 2016-11-16 ENCOUNTER — Ambulatory Visit: Payer: Medicare Other | Admitting: Physical Therapy

## 2016-11-16 ENCOUNTER — Encounter: Payer: Self-pay | Admitting: Occupational Therapy

## 2016-11-16 ENCOUNTER — Ambulatory Visit: Payer: Medicare Other | Admitting: Occupational Therapy

## 2016-11-16 ENCOUNTER — Encounter: Payer: Self-pay | Admitting: Physical Medicine & Rehabilitation

## 2016-11-16 ENCOUNTER — Encounter: Payer: Medicare Other | Attending: Physical Medicine & Rehabilitation | Admitting: Physical Medicine & Rehabilitation

## 2016-11-16 VITALS — BP 109/73 | HR 81

## 2016-11-16 DIAGNOSIS — Z930 Tracheostomy status: Secondary | ICD-10-CM | POA: Diagnosis not present

## 2016-11-16 DIAGNOSIS — J40 Bronchitis, not specified as acute or chronic: Secondary | ICD-10-CM

## 2016-11-16 DIAGNOSIS — G8250 Quadriplegia, unspecified: Secondary | ICD-10-CM | POA: Diagnosis not present

## 2016-11-16 DIAGNOSIS — R293 Abnormal posture: Secondary | ICD-10-CM

## 2016-11-16 DIAGNOSIS — E785 Hyperlipidemia, unspecified: Secondary | ICD-10-CM | POA: Diagnosis not present

## 2016-11-16 DIAGNOSIS — R29818 Other symptoms and signs involving the nervous system: Secondary | ICD-10-CM

## 2016-11-16 DIAGNOSIS — G4700 Insomnia, unspecified: Secondary | ICD-10-CM | POA: Insufficient documentation

## 2016-11-16 DIAGNOSIS — Z8619 Personal history of other infectious and parasitic diseases: Secondary | ICD-10-CM | POA: Insufficient documentation

## 2016-11-16 DIAGNOSIS — Z09 Encounter for follow-up examination after completed treatment for conditions other than malignant neoplasm: Secondary | ICD-10-CM | POA: Insufficient documentation

## 2016-11-16 DIAGNOSIS — G43909 Migraine, unspecified, not intractable, without status migrainosus: Secondary | ICD-10-CM | POA: Insufficient documentation

## 2016-11-16 DIAGNOSIS — R41844 Frontal lobe and executive function deficit: Secondary | ICD-10-CM

## 2016-11-16 DIAGNOSIS — J9610 Chronic respiratory failure, unspecified whether with hypoxia or hypercapnia: Secondary | ICD-10-CM | POA: Insufficient documentation

## 2016-11-16 DIAGNOSIS — R5381 Other malaise: Secondary | ICD-10-CM | POA: Diagnosis present

## 2016-11-16 DIAGNOSIS — Z853 Personal history of malignant neoplasm of breast: Secondary | ICD-10-CM | POA: Diagnosis not present

## 2016-11-16 DIAGNOSIS — I491 Atrial premature depolarization: Secondary | ICD-10-CM | POA: Diagnosis not present

## 2016-11-16 DIAGNOSIS — G0490 Encephalitis and encephalomyelitis, unspecified: Secondary | ICD-10-CM | POA: Diagnosis not present

## 2016-11-16 DIAGNOSIS — R2689 Other abnormalities of gait and mobility: Secondary | ICD-10-CM | POA: Diagnosis not present

## 2016-11-16 DIAGNOSIS — R252 Cramp and spasm: Secondary | ICD-10-CM | POA: Diagnosis not present

## 2016-11-16 DIAGNOSIS — M25622 Stiffness of left elbow, not elsewhere classified: Secondary | ICD-10-CM

## 2016-11-16 DIAGNOSIS — M25611 Stiffness of right shoulder, not elsewhere classified: Secondary | ICD-10-CM

## 2016-11-16 DIAGNOSIS — M6281 Muscle weakness (generalized): Secondary | ICD-10-CM

## 2016-11-16 DIAGNOSIS — M25612 Stiffness of left shoulder, not elsewhere classified: Secondary | ICD-10-CM

## 2016-11-16 NOTE — Patient Instructions (Signed)
URECHOLINE 25MG  TWICE DAILY FOR 2 WEEKS, THEN DECREASE TO DAILY FOR 2 WEEKS THEN STOP.   PLEASE FEEL FREE TO CALL OUR OFFICE WITH ANY PROBLEMS OR QUESTIONS ([phone])

## 2016-11-16 NOTE — Therapy (Signed)
[facility] [address] [place], [place], [address] Phone: [phone]   Fax:  [phone]  Occupational Therapy Treatment  Patient Details  Name: Judy Spears MRN: [id number] Date of Birth: 01-Jun-1950 Referring Provider: Dr. [name]  Encounter Date: 11/16/2016      OT End of Session - 11/16/16 1510    Visit Number 3   Number of Visits 16   Date for OT Re-Evaluation 12/27/16   Authorization Type medicare will need PN and G code every 10th visit   Authorization Time Period 60 days   Authorization - Visit Number 3   Authorization - Number of Visits 10   OT Start Time 1402   OT Stop Time 1450   OT Time Calculation (min) 48 min   Activity Tolerance Patient limited by lethargy      Past Medical History:  Diagnosis Date  . Arthritis    lt great toe  . Complication of anesthesia    pt has had headaches post op-did advise to hydra well preop  . Hair loss    right-sided  . History of exercise stress test    a. ETT (12/15) with 12:00 exercise, no ST changes, occasional PACs.   . Hyperlipidemia   . Insomnia   . Migraine headache   . Movement disorder    resltess in left legs  . Postmenopausal   . 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 Premature atrial contractions    a. Holter (12/15) with 8% PACs, no atrial runs or atrial fibrillation.     Past Surgical History:  Procedure Laterality Date  . BREAST BIOPSY  01/01/2011   Procedure: BREAST BIOPSY WITH NEEDLE LOCALIZATION;  Surgeon: Rolm Bookbinder, MD;  Location: [facility];  Service: General;  Laterality: Left;  left breast wire localization biopsy  . BREAST BIOPSY Right 07/27/2012  . BREAST BIOPSY Right 07/07/2016  . BREAST BIOPSY Left 06/30/2016  . cataracts right eye    . CESAREAN SECTION  1986  . HERNIA REPAIR  2000   RIH  . IR [place] TUBE PERCUT W/FLUORO  09/29/2016  . opirectinal membrane peel    . PEG PLACEMENT    .  RHINOPLASTY  1983  . TRACHEOSTOMY      There were no vitals filed for this visit.      Subjective Assessment - 11/16/16 1457    Subjective  Pt nods yes to simple yes/no questions   Patient is accompained by: Family member  caregiver [name]   Pertinent History see epic snapshot - ABI/encephalitis/hypoxia from virus; recent hospitalization due to MRSA PNA    Patient Stated Goals Pt unable to state.    Currently in Pain? No/denies  however during session pt indicated pain in groin area and L hip with some stretching                      OT Treatments/Exercises (OP) - 11/16/16 0001      Neurological Re-education Exercises   Other Exercises 1 Neuro re ed to address pt actively participating in squat pivot transfers.  Pt was total a x1 today and did not activate during transfer to or from wheelchair.  Caregiver however stated pt did assist with sit to stand with steady this morning and that with steady caregiver was able to toilet pt by herself (total a x1).  Adddressed sitting balance with sideleaning repetitiously to  address postural orientation, postural righting reactions, and trunk control.  Pt  able to hold in midline for very brief periods following signficant facilitation. Also addressed sit to stand from elevated surface - max a x2 however pt did attempt to assist in standing.  Pt appears at this point to have decreased PROM in L hip/knee extension as well as decreased thoracic extension.  Pt with severe L bias in sitting and standing due to abnormal postural reactions, abnormal righting reactions, increased tone, decreased range througout trunk, and impaired visual perceptual skills. Intermitten lethargy also impacts sitting balance as well. Pt intermittently following one step commands, and needed cues for alertness.                   OT Short Term Goals - 11/16/16 1505      OT SHORT TERM GOAL #1   Title Pt, caregiver and husband will be mod I with HEP -  11/29/2016   Status On-going     OT SHORT TERM GOAL #2   Title Pt will consistently require max assist x1 from caregiver or husband for basic transfers (currently total A x 2 for untrained health professional)   Status On-going     OT SHORT TERM GOAL #3   Title Pt will sit on level surfaces in preparation for participation in ADL tasks with intermittent min a and mod cues after preparation.   Status On-going     OT SHORT TERM GOAL #4   Title Pt will require no more than total assist x1 from family/caregiver to use steady assist for basic transfers (not toilet or shower transfers).     Status On-going     OT SHORT TERM GOAL #5   Title Pt will demonstrate ability to follow simple, functional commands in context at least 50% of the time.    Status On-going           OT Long Term Goals - 11/16/16 1506      OT LONG TERM GOAL #1   Title Pt , husband and caregiver will be mod I with upgraded HEP prn - 12/27/2016   Status On-going     OT LONG TERM GOAL #2   Title Pt will will demonstrate ability to sit on even surface with supervision while engaging in simple activity   Status On-going     OT LONG TERM GOAL #3   Title Pt will be mod a for squat pivot basic transfers   Status On-going     OT LONG TERM GOAL #4   Title Pt will be mod a x1 to use steady for toilet transfers   Status On-going     OT LONG TERM GOAL #5   Title Pt will be max a x1 for clothing mgmt using steady for toilet transfers   Status On-going     OT LONG TERM GOAL #6   Title Pt will demonstrate ability to don shirt over both arms with set up.    Status On-going     OT LONG TERM GOAL #7   Title Pt will demonstrate ability to sit with supevision in wheelchair shower seat during shower.    Status On-going               Plan - 11/16/16 1506    Clinical Impression Statement Pt with very slow progress toward goals. Pt with some improvement in basic sitting balance today at times.  Pt continues to  require cues for alertness   Rehab Potential Fair   Current Impairments/barriers affecting progress: severity of deficits, very  slow rate of recovery; pt with recent significant decline over past several months due medical complications   OT Frequency 3x / week   OT Duration 8 weeks   OT Treatment/Interventions Self-care/ADL training;Ultrasound;Moist Heat;Electrical Stimulation;DME and/or AE instruction;Neuromuscular education;Therapeutic exercise;Functional Mobility Training;Manual Therapy;Passive range of motion;Splinting;Therapeutic activities;Balance training;Patient/family education;Visual/perceptual remediation/compensation;Cognitive remediation/compensation   Plan NMR for trunk, sitting balance, weight shifting, basic pt initiated movement, wheelchair positioning, sit to stand   Consulted and Agree with Plan of Care Patient;Family member/caregiver   Family Member Consulted caregiver [name]      Patient will benefit from skilled therapeutic intervention in order to improve the following deficits and impairments:     Visit Diagnosis: Abnormal posture - Plan: Ot plan of care cert/re-cert  Muscle weakness (generalized) - Plan: Ot plan of care cert/re-cert  Other symptoms and signs involving the nervous system - Plan: Ot plan of care cert/re-cert  Stiffness of left shoulder, not elsewhere classified - Plan: Ot plan of care cert/re-cert  Stiffness of right shoulder, not elsewhere classified - Plan: Ot plan of care cert/re-cert  Stiffness of left elbow, not elsewhere classified - Plan: Ot plan of care cert/re-cert  Frontal lobe and executive function deficit - Plan: Ot plan of care cert/re-cert  Other abnormalities of gait and mobility - Plan: Ot plan of care cert/re-cert    Problem List Patient Active Problem List   Diagnosis Date Noted  . Pseudomonas urinary tract infection 10/27/2016  . Severe muscle deconditioning 09/30/2016  . Pressure injury of skin 09/28/2016  .  Respiratory failure ([place])   . Tracheobronchitis   . Urinary retention   . PEG (percutaneous endoscopic gastrostomy) status ([place])   . Benign essential HTN   . Hyponatremia   . Hypokalemia   . Reactive hypertension   . Respiratory insufficiency 09/24/2016  . Malignant neoplasm of upper-inner quadrant of left breast in female, estrogen receptor positive ([place]) 07/14/2016  . Heterotopic ossification of bone 03/10/2016  . Acute on chronic respiratory failure with hypoxia ([place])   . Acute hypoxemic respiratory failure ([place])   . Acute blood loss anemia   . Atelectasis 02/04/2016  . Debility 02/03/2016  . Pneumonia of both lower lobes due to Pseudomonas species ([place])   . Pneumonia of both lower lobes due to methicillin susceptible Staphylococcus aureus (MSSA) ([place])   . Acute respiratory failure with hypoxia ([place])   . Acute encephalopathy   . Seizures ([place])   . Sepsis ([place])   . Hypoxia   . Pain   . HCAP (healthcare-associated pneumonia) 12/28/2015  . Chronic respiratory failure ([place]) 12/28/2015  . Acute tracheobronchitis 12/24/2015  . Tracheostomy tube present ([place])   . Tracheal stenosis   . Chronic respiratory failure with hypoxia ([place])   . Spastic tetraplegia ([place]) 10/20/2015  . Tracheostomy status ([place]) 09/26/2015  . Allergic rhinitis 09/26/2015  . 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 Movement disorder 09/22/2015  . Encephalitis 09/22/2015  . Chest pain 02/07/2014  . Premature atrial contractions 01/13/2014  . Abnormality of gait 12/04/2013  . Hyperlipidemia 12/21/2011  . Cardiovascular risk factor 12/21/2011  . Bunion 01/31/2008  . Metatarsalgia of both feet 09/20/2007  . FLAT FOOT 09/20/2007  . HALLUX RIGIDUS, ACQUIRED 09/20/2007    Quay Burow, OTR/L 11/16/2016, 3:15 PM  [facility] [address] [place], [place], [address] Phone: [phone]   Fax:  [phone]  Name: Noura Purpura MRN: [id number] Date of Birth: Nov 25, 1950

## 2016-11-16 NOTE — Progress Notes (Signed)
Subjective:    Patient ID: Judy Spears, female    DOB: [birth year], 66 y.o.   MRN: [id number]  HPI   Judy Spears is here in follow up of her recent rehab stay. She has been back in physical therapy. She has continued to gain weight. She is up to 95.5 lbs. TF continues at the same rate we sent her home on. Stools have been formed. She is now emptying her bladder without issue (albeit incontinently). Her tremors are more sporadic. She is showing improvement in strength gradually. Family and staff have noticed her leaning more to left and "twisting" of her back. Pain seems to control. Sleep is inconsistent  She was in the ED last week with what appears to have been a mucous plug. Trach aspirate was consistent with prior samples. She is completing of course of bactrim tomorrow. She is afebrile.   Husband brought in MRI from January of this year for comparison to others I have viewed. He is still questioning why things changed over the summer and wonders whether or not her virus could still be "active". They have no ID follow up here in [place] currently.    Pain Inventory Average Pain 8 Pain Right Now 0 My pain is sharp  In the last 24 hours, has pain interfered with the following? General activity 8 Relation with others 8 Enjoyment of life 8 What TIME of day is your pain at its worst? all Sleep (in general) Fair  Pain is worse with: walking, sitting, standing and some activites Pain improves with: . Relief from Meds: 6  Mobility ability to climb steps?  no do you drive?  no use a wheelchair needs help with transfers  Function Do you have any goals in this area?  no  Neuro/Psych trouble walking spasms  Prior Studies Any changes since last visit?  no  Physicians involved in your care Any changes since last visit?  no   Family History  Problem Relation Age of Onset  . Stroke Mother        paralyzed on right side/aphasia  . Hypertension Mother        had stroke after  stopping BP meds  . Colon cancer Father   . Prostate cancer Father   . Heart attack Maternal Grandfather 56  . Stroke Maternal Uncle 21   Social History   Social History  . Marital status: Married    Spouse name: N/A  . Number of children: 3  . Years of education: N/A   Social History Main Topics  . Smoking status: Never Smoker  . Smokeless tobacco: Never Used  . Alcohol use 0.0 oz/week     Comment: Occasional.  . Drug use: No  . Sexual activity: Yes    Birth control/ protection: Post-menopausal   Other Topics Concern  . Not on file   Social History Narrative  . No narrative on file   Past Surgical History:  Procedure Laterality Date  . BREAST BIOPSY  01/01/2011   Procedure: BREAST BIOPSY WITH NEEDLE LOCALIZATION;  Surgeon: Rolm Bookbinder, MD;  Location: [facility];  Service: General;  Laterality: Left;  left breast wire localization biopsy  . BREAST BIOPSY Right 07/27/2012  . BREAST BIOPSY Right 07/07/2016  . BREAST BIOPSY Left 06/30/2016  . cataracts right eye    . CESAREAN SECTION  1986  . HERNIA REPAIR  2000   RIH  . IR [place] TUBE PERCUT W/FLUORO  09/29/2016  . opirectinal membrane  peel    . PEG PLACEMENT    . RHINOPLASTY  1983  . TRACHEOSTOMY     Past Medical History:  Diagnosis Date  . Arthritis    lt great toe  . Complication of anesthesia    pt has had headaches post op-did advise to hydra well preop  . Hair loss    right-sided  . History of exercise stress test    a. ETT (12/15) with 12:00 exercise, no ST changes, occasional PACs.   . Hyperlipidemia   . Insomnia   . Migraine headache   . Movement disorder    resltess in left legs  . Postmenopausal   . 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 Premature atrial contractions    a. Holter (12/15) with 8% PACs, no atrial runs or atrial fibrillation.    LMP 12/01/2010   Opioid Risk Score:   Fall Risk Score:  `1  Depression screen PHQ 2/9  Depression screen PHQ 2/9 07/14/2016    Decreased Interest 0  Down, Depressed, Hopeless 1  PHQ - 2 Score 1  Some recent data might be hidden     Review of Systems  Constitutional: Positive for diaphoresis.  HENT: Negative.   Eyes: Negative.   Respiratory: Negative.   Cardiovascular: Negative.   Gastrointestinal: Negative.   Endocrine: Negative.   Genitourinary: Negative.   Musculoskeletal: Negative.   Skin: Negative.   Allergic/Immunologic: Negative.   Neurological: Negative.   Hematological: Negative.   Psychiatric/Behavioral: Negative.   All other systems reviewed and are negative.      Objective:   Physical Exam  Constitutional: alert, color and weight improved.  HENT:  Head: NCAT Mouth/Throat: Oropharynx is dry Eyes: PERRL Neck: trach clean and no visible drainage Cardiovascular: RRR without murmur. No JVD           .    Respiratory: no distress. [name]  GI: BS +, non-tender, non-distended PEG intact.   Musculoskeletal: no tenderness at rest Neuro:  more alert. Stayed awake. Voiced and spoke briefly. Followed simple commands.  ongoing dyskinesias present but less. Better voluntary control of right>left limbs. Leans to left but can be corrected with re-positioning.   Skin: no visible breakdown Psych: flat but cooperative      Assessment & Plan:  1. 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 Expect ongoing functional and neurological gains, gradually.   -consider modifications to chair to better control trunk. Suspect some of the increased leaning to the left is due improved strength of her right side and "pushing". Trunk itself is still weak              -made a referral to infectious disease, Dr. Johnnye Sima, regarding family's desire to assess for the possibility of "active" encephalitis as a cause of some of her ongoing neurological deficits and radiographical  abnormalities.   -husband brought CD of MRI from [facility] (1/18) for comparison to other scans available to help assess for any interval, progressive changes compared to the films I had ordered recently on rehab and the films from 12/16  2.   Pain Management:  -continue low dose gabapentin, adjusted to 100mg  BID to improve arousal 4. Mood: LCSW to follow for evaluation and support.  5. Neuropsych: This patient is notcapable of making decisions on herown behalf. 8. MRSA tracheobronchitis/trach dependency: another trial of bactrim nearly complete  -  follow up per pulmonology  -flu precautions for winter months 9. FEN: she is putting on gradual weight and looks good today.   -would continue TF at current rates/amounts  -healthy weight will help her fight setback, especially of the infectious etiology                           10. Urinary retention: voiding regularly, incontinent  -wean urecholine to off over next 2-4 weeks             11. HTN:  per primary  12. Dyskinesias/tremors: mysoline                         -continue mysoline 50mg  qid                         -    adjustable hinged/locking left knee brace to be worn at night                                     -wearing/tolerating so far and seems to be tolerating                         -continued therapies 16. Breast cancer with hx of lumpectomy   Twenty five minutes of direction patient time was spent today. Follow up in 6 weeks.

## 2016-11-17 ENCOUNTER — Encounter: Payer: Medicare Other | Admitting: Physical Medicine & Rehabilitation

## 2016-11-18 ENCOUNTER — Ambulatory Visit: Payer: Medicare Other | Admitting: Physical Therapy

## 2016-11-18 ENCOUNTER — Ambulatory Visit: Payer: Medicare Other | Admitting: Speech Pathology

## 2016-11-18 DIAGNOSIS — R1312 Dysphagia, oropharyngeal phase: Secondary | ICD-10-CM

## 2016-11-18 DIAGNOSIS — R471 Dysarthria and anarthria: Secondary | ICD-10-CM

## 2016-11-18 DIAGNOSIS — R2689 Other abnormalities of gait and mobility: Secondary | ICD-10-CM | POA: Diagnosis not present

## 2016-11-18 DIAGNOSIS — M6281 Muscle weakness (generalized): Secondary | ICD-10-CM

## 2016-11-18 DIAGNOSIS — R41841 Cognitive communication deficit: Secondary | ICD-10-CM

## 2016-11-18 DIAGNOSIS — R293 Abnormal posture: Secondary | ICD-10-CM

## 2016-11-18 DIAGNOSIS — R29818 Other symptoms and signs involving the nervous system: Secondary | ICD-10-CM

## 2016-11-18 NOTE — Therapy (Signed)
[facility] [address] [place], [place], [address] Phone: [phone]   Fax:  [phone]  Speech Language Pathology Treatment  Patient Details  Name: Judy Spears MRN: [id number] Date of Birth: Sep 28, 1950 Referring Provider: Dr. Quita Skye. [name]  Encounter Date: 11/18/2016      End of Session - 11/18/16 1749    Visit Number 3   Number of Visits 17   Date for SLP Re-Evaluation 01/05/17   SLP Start Time 2778   SLP Stop Time  1447   SLP Time Calculation (min) 45 min   Activity Tolerance Patient tolerated treatment well;Patient limited by lethargy      Past Medical History:  Diagnosis Date  . Arthritis    lt great toe  . Complication of anesthesia    pt has had headaches post op-did advise to hydra well preop  . Hair loss    right-sided  . History of exercise stress test    a. ETT (12/15) with 12:00 exercise, no ST changes, occasional PACs.   . Hyperlipidemia   . Insomnia   . Migraine headache   . Movement disorder    resltess in left legs  . Postmenopausal   . 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 Premature atrial contractions    a. Holter (12/15) with 8% PACs, no atrial runs or atrial fibrillation.     Past Surgical History:  Procedure Laterality Date  . BREAST BIOPSY  01/01/2011   Procedure: BREAST BIOPSY WITH NEEDLE LOCALIZATION;  Surgeon: Rolm Bookbinder, MD;  Location: [facility];  Service: General;  Laterality: Left;  left breast wire localization biopsy  . BREAST BIOPSY Right 07/27/2012  . BREAST BIOPSY Right 07/07/2016  . BREAST BIOPSY Left 06/30/2016  . cataracts right eye    . CESAREAN SECTION  1986  . HERNIA REPAIR  2000   RIH  . IR [place] TUBE PERCUT W/FLUORO  09/29/2016  . opirectinal membrane peel    . PEG PLACEMENT    . RHINOPLASTY  1983  . TRACHEOSTOMY      There were no vitals filed for this visit.      Subjective Assessment - 11/18/16 1738     Subjective Pt makes eye contact, smiles at SLP.   Patient is accompained by: Family member   Special Tests husband [name], caregiver [name]   Currently in Pain? No/denies               ADULT SLP TREATMENT - 11/18/16 0001      General Information   Behavior/Cognition Cooperative;Distractible;Requires cueing;Pleasant mood;Lethargic   Patient Positioning Upright in chair     Treatment Provided   Treatment provided Cognitive-Linquistic     Pain Assessment   Pain Assessment No/denies pain     Cognitive-Linquistic Treatment   Treatment focused on Voice;Dysarthria   Skilled Treatment Pt limited today by lethargy, required cues (verbal, tactile) for attention and alertness every 1-2 minutes. With mod-max SLP cues, pt attempted 5 reps x 3 sets with inspiratory trainer. Pt required cues for attention/arousal after 3 reps. Manages saliva today with verbal cue. Able to achieve proper inhalation with max A at 9cm H20 in 50% of attempts. Word level repetition and convergent naming tasks with usual mod-max A for coordination of respiration/phonation, increased vocal intensity and amplitude of articulatory movements.     Tracheostomy Shiley 4 mm Uncuffed   Tracheostomy Properties Placement Date: 11/11/16 Placement Time: 1510 Inserted prior to hospital arrival?: Yes Brand: Shiley Size (mm):  4 mm Style: Uncuffed   Status Capped;Secured   Site Assessment Clean;Dry   Ties Assessment Clean;Dry;Secure     Assessment / Recommendations / Plan   Plan Continue with current plan of care     Dysphagia Recommendations   Diet recommendations NPO;Other(comment)  ice chips after oral care   Liquids provided via Teaspoon   Medication Administration Via alternative means   Supervision Full supervision/cueing for compensatory strategies     Progression Toward Goals   Progression toward goals Progressing toward goals     General Information   Reason PO's not observed --  targeted dysathria/voice at  family request          SLP Education - 11/18/16 1749    Education provided Yes   Education Details Attempt IMT at home, 3 sets of 5 reps   Person(s) Educated Patient;Spouse;Caregiver(s)   Methods Explanation   Comprehension Verbalized understanding          SLP Short Term Goals - 11/18/16 1751      SLP SHORT TERM GOAL #1   Title Pt will demonstrate inspiratory and blowing exercises to increase respiratory support and coordination for vocalizations with usual mod A   Time 4   Period Weeks   Status On-going     SLP SHORT TERM GOAL #2   Title Pt will achieve audible phonation with prolonged vowels and/or CV syllables in 75% of trials with usual mod A.    Time 4   Period Weeks   Status On-going     SLP SHORT TERM GOAL #3   Title Pt will coordinate respiration/phonation for imitation of single words in 75% of trials with usual mod A.    Time 4   Period Weeks   Status On-going     SLP SHORT TERM GOAL #4   Title Pt will sustain attention to therapy tasks for 7 minutes with mod A multimodal cues.    Time 4   Period Weeks   Status On-going     SLP SHORT TERM GOAL #5   Title Pt will participate in thorough oral care regimen at least 4 times per day and prior to trials of ice chips per caregiver report over 3 sessions.   Time 4   Period Weeks   Status On-going          SLP Long Term Goals - 11/18/16 1752      SLP LONG TERM GOAL #1   Title Pt will demonstrate cough/swallow for secretion management with min A from caregiver over 5 sessions.   Time 8   Period Weeks   Status On-going     SLP LONG TERM GOAL #2   Title Pt will demonstrate audible phonation/intelligible speech for 2 word phrases with usual min A in 7/10 trials.   Time 8   Period Weeks   Status On-going     SLP LONG TERM GOAL #3   Title Pt will communicate a basic want/ need by any functional means, including AAC, with mod A from caregivers over 3 sessions.   Time 8   Period Weeks   Status  On-going     SLP LONG TERM GOAL #4   Title Pt will demonstrate appearance of timely swallow/ functional oral manipulation of ice chips in 4/5 trials with usual mod A.    Time 8   Period Weeks   Status On-going          Plan - 11/18/16 1749    Clinical Impression Statement  Patient presents with ongoing deficits in speech intelligibility (dysarthria, reduced breath support), cognitive communication and swallowing. Pt sustaining alertness/attention for periods of 1-2 minutes, requiring frequent verbal, tactile cues. Husband/caregiver stood pt which briefly did improve pt's attention/arousal. In automatic speech and single word imitation tasks today, pt required usual mod-max A. Recommend skilled ST to address pt's decline in communication and swallowing  in order to maximize functional communication, secretion management/ swallowing safety, reduce caregiver burden and improve quality of life.   Speech Therapy Frequency 2x / week   Treatment/Interventions Aspiration precaution training;Trials of upgraded texture/liquids;Compensatory strategies;Functional tasks;Patient/family education;Cueing hierarchy;Diet toleration management by SLP;Cognitive reorganization;Compensatory techniques;SLP instruction and feedback;Internal/external aids;Multimodal communcation approach;Pharyngeal strengthening exercises   Potential to Achieve Goals Fair   Potential Considerations Ability to learn/carryover information;Co-morbidities;Severity of impairments;Cooperation/participation level;Previous level of function;Pain level   Consulted and Agree with Plan of Care Patient;Family member/caregiver   Family Member Consulted caregiver leesa, husband chip      Patient will benefit from skilled therapeutic intervention in order to improve the following deficits and impairments:   Dysarthria and anarthria  Cognitive communication deficit  Dysphagia, oropharyngeal phase    Problem List Patient Active Problem List    Diagnosis Date Noted  . Pseudomonas urinary tract infection 10/27/2016  . Severe muscle deconditioning 09/30/2016  . Pressure injury of skin 09/28/2016  . Respiratory failure ([place])   . Tracheobronchitis   . Urinary retention   . PEG (percutaneous endoscopic gastrostomy) status ([place])   . Benign essential HTN   . Hyponatremia   . Hypokalemia   . Reactive hypertension   . Respiratory insufficiency 09/24/2016  . Malignant neoplasm of upper-inner quadrant of left breast in female, estrogen receptor positive ([place]) 07/14/2016  . Heterotopic ossification of bone 03/10/2016  . Acute on chronic respiratory failure with hypoxia ([place])   . Acute hypoxemic respiratory failure ([place])   . Acute blood loss anemia   . Atelectasis 02/04/2016  . Debility 02/03/2016  . Pneumonia of both lower lobes due to Pseudomonas species ([place])   . Pneumonia of both lower lobes due to methicillin susceptible Staphylococcus aureus (MSSA) ([place])   . Acute respiratory failure with hypoxia ([place])   . Acute encephalopathy   . Seizures ([place])   . Sepsis ([place])   . Hypoxia   . Pain   . HCAP (healthcare-associated pneumonia) 12/28/2015  . Chronic respiratory failure ([place]) 12/28/2015  . Acute tracheobronchitis 12/24/2015  . Tracheostomy tube present ([place])   . Tracheal stenosis   . Chronic respiratory failure with hypoxia ([place])   . Spastic tetraplegia ([place]) 10/20/2015  . Tracheostomy status ([place]) 09/26/2015  . Allergic rhinitis 09/26/2015  . 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 Movement disorder 09/22/2015  . Encephalitis 09/22/2015  . Chest pain 02/07/2014  . Premature atrial contractions 01/13/2014  . Abnormality of gait 12/04/2013  . Hyperlipidemia 12/21/2011  . Cardiovascular risk factor 12/21/2011  . Bunion 01/31/2008  . Metatarsalgia of both feet 09/20/2007  . FLAT FOOT 09/20/2007  . HALLUX RIGIDUS, ACQUIRED 09/20/2007   Deneise Lever, [place], [place] Speech-Language Pathologist   Aliene Altes 11/18/2016, 5:53 PM  [facility] [address] [place], [place], [address] Phone: [phone]   Fax:  [phone]   Name: Judy Spears MRN: [id number] Date of Birth: 08/14/1950

## 2016-11-19 ENCOUNTER — Ambulatory Visit: Payer: Medicare Other | Attending: Physical Medicine & Rehabilitation | Admitting: Occupational Therapy

## 2016-11-19 ENCOUNTER — Encounter: Payer: Self-pay | Admitting: Occupational Therapy

## 2016-11-19 ENCOUNTER — Ambulatory Visit: Payer: Medicare Other | Admitting: Physical Therapy

## 2016-11-19 DIAGNOSIS — R258 Other abnormal involuntary movements: Secondary | ICD-10-CM

## 2016-11-19 DIAGNOSIS — M25612 Stiffness of left shoulder, not elsewhere classified: Secondary | ICD-10-CM | POA: Insufficient documentation

## 2016-11-19 DIAGNOSIS — M25611 Stiffness of right shoulder, not elsewhere classified: Secondary | ICD-10-CM | POA: Insufficient documentation

## 2016-11-19 DIAGNOSIS — R293 Abnormal posture: Secondary | ICD-10-CM | POA: Insufficient documentation

## 2016-11-19 DIAGNOSIS — R2689 Other abnormalities of gait and mobility: Secondary | ICD-10-CM

## 2016-11-19 DIAGNOSIS — R29818 Other symptoms and signs involving the nervous system: Secondary | ICD-10-CM | POA: Insufficient documentation

## 2016-11-19 DIAGNOSIS — M6281 Muscle weakness (generalized): Secondary | ICD-10-CM | POA: Diagnosis present

## 2016-11-19 DIAGNOSIS — M25622 Stiffness of left elbow, not elsewhere classified: Secondary | ICD-10-CM | POA: Insufficient documentation

## 2016-11-19 DIAGNOSIS — R41844 Frontal lobe and executive function deficit: Secondary | ICD-10-CM | POA: Diagnosis present

## 2016-11-19 DIAGNOSIS — R278 Other lack of coordination: Secondary | ICD-10-CM | POA: Insufficient documentation

## 2016-11-19 NOTE — Therapy (Signed)
[facility] [address] [place], [place], [address] Phone: [phone]   Fax:  [phone]  Physical Therapy Treatment  Patient Details  Name: Judy Spears MRN: [id number] Date of Birth: 29-Dec-1950 Referring Provider: Alger Simons, MD  Encounter Date: 11/18/2016      PT End of Session - 11/18/16 1630    Visit Number 5   Number of Visits 20   Date for PT Re-Evaluation 12/27/16   Authorization Type [employer] MCR   PT Start Time 8841   PT Stop Time 1615   PT Time Calculation (min) 44 min   Activity Tolerance Patient tolerated treatment well   Behavior During Therapy [facility] for tasks assessed/performed      Past Medical History:  Diagnosis Date  . Arthritis    lt great toe  . Complication of anesthesia    pt has had headaches post op-did advise to hydra well preop  . Hair loss    right-sided  . History of exercise stress test    a. ETT (12/15) with 12:00 exercise, no ST changes, occasional PACs.   . Hyperlipidemia   . Insomnia   . Migraine headache   . Movement disorder    resltess in left legs  . Postmenopausal   . 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 Premature atrial contractions    a. Holter (12/15) with 8% PACs, no atrial runs or atrial fibrillation.     Past Surgical History:  Procedure Laterality Date  . BREAST BIOPSY  01/01/2011   Procedure: BREAST BIOPSY WITH NEEDLE LOCALIZATION;  Surgeon: Rolm Bookbinder, MD;  Location: [facility];  Service: General;  Laterality: Left;  left breast wire localization biopsy  . BREAST BIOPSY Right 07/27/2012  . BREAST BIOPSY Right 07/07/2016  . BREAST BIOPSY Left 06/30/2016  . cataracts right eye    . CESAREAN SECTION  1986  . HERNIA REPAIR  2000   RIH  . IR [place] TUBE PERCUT W/FLUORO  09/29/2016  . opirectinal membrane peel    . PEG PLACEMENT    . RHINOPLASTY  1983  . TRACHEOSTOMY      There were no vitals filed for this  visit.      Subjective Assessment - 11/18/16 1700    Subjective Pt has been at Md appt's this am and had ST earlier this afternoon. She is alert with eyes open a this time and engaging with PTA, nodding her head to questions asked of her. Denies any pain at this time.    Patient is accompained by: Family member  spouse and caregiver   Pertinent History Precautions:  PEG.  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  Another round of botox in LLE on 02/06/16   Limitations House hold activities;Walking;Standing;Sitting;Lifting;Writing   Patient Stated Goals Per husband, "For [name] to be as independent as possible."   Currently in Pain? No/denies   Pain Score 0-No pain           [facility] Adult PT Treatment/Exercise - 11/18/16 1700      Transfers   Transfers Sit to Stand   Sit to Stand 3: Mod assist;2: Max assist;Without upper extremity assist;With armrests;From bed;From chair/3-in-1   Sit to Stand Details Manual facilitation for weight shifting;Verbal cues for sequencing;Verbal cues for technique;Manual facilitation for weight bearing;Tactile cues for sequencing;Tactile cues for  posture   Sit to Stand Details (indicate cue type and reason) cues for increased anterior weight shifting ("lean into me". assistance and cues needed for LE placement to widen base of support, tall trunk with anterior leaning and to "power up" through LE's    Sit to Stand: Patient Percentage --  vaired, increased with repitition   Stand to Sit 3: Mod assist;2: Max assist;With upper extremity assist;To bed;To chair/3-in-1   Stand to Sit Details (indicate cue type and reason) Tactile cues for initiation;Tactile cues for weight shifting;Verbal cues for sequencing;Verbal cues for technique;Manual facilitation for weight shifting   Stand to  Sit Details cues/assistance needed for hip/knee flexion with sitting, manual assistance at right knee due to locked into extension with tone.    Stand Pivot Transfers 2: Max assist;1: +1 Total assist   Stand Pivot Transfer Details (indicate cue type and reason) total assist with transfer from wheelchair to mat table with pt holding onto PTA and accepting minimal weight through LE's. on second transfer from mat to wheelchair pt needed less assistance and was able to self advance bil LE's for pivot steps toward chair. assist for weight shifting needed as well.    Comments sit<>stands performed 4-5 reps with decreased assistance each time. in standing worked on hip extension, left knee extension and trunk extension/shoulder back with 2 person assist to facilitate all the above. once posture was as good as it was able to be worked on lateral weight shifitng, then back in midline worked on alternating UE raises off of PTA shoulders, progressing to left LE weight bearing with right foot stepping fwd/bwd x2 short/small steps. all with mod/max assist for balance of 1 person with both persons assisting to maintain posture.     Neuro Re-ed    Neuro Re-ed Details  seated edge of mat: pt noted to have significant lean toward left side. worked on right elbow taps down to mat with elbow extension to right herself into sitting. min/mod assist of 1 person for balance and faciliation of motions, performed x 10 reps. then had pt prop onto right elbow with gentle overpressure applied to left hip in posterior direction and at flank just under pt's left arm in anterior/posterior directions to apply a gentle upper trunk stretch for postural correction x 4-5 reps. once pt returned to upright posture pt demo'd improved midline positioning with close supervision for static sitting balance, any movement and pt required hands on assistance for balance. in midline that was achieved worked on thoracic stretching with PTA placing her thigh  along pt's spine and providing scapular retraction for 20 sec holds x 5 reps, then worked on anterior weight shifting via "push me away", then "pull me to you" cues x 8 reps prior to standing. assistance needed to maintain balance and midline with via facilitation to trunk (2 person's used for facilitation with edge of mat activities)                                   PT Short Term Goals - 10/28/16 1950      PT SHORT TERM GOAL #1   Title Perform sit to/from stand from wheelchair or mat with +1 mod assist.   Baseline max to total assist on 10-28-16   Time 4   Period Weeks   Status New   Target Date 11/27/16     PT SHORT TERM GOAL #2   Title Pt  will perform sit to stand from wheelchair to RW with mod assist.   Baseline max to total assist - 10-28-16   Time 4   Period Weeks   Status New   Target Date 11/27/16     PT SHORT TERM GOAL #3   Title Pt will improve standing balance/trunk control so that caregiver [name]) is able to transfer pt using [place] without requiring a 2nd person for assist.     Baseline 2 people required with total assist - Charlaine Dalton is unable to be used at this time - 10-28-16   Time 4   Period Weeks   Status New   Target Date 11/27/16     PT SHORT TERM GOAL #4   Title Pt will sit with RUE support with mod assist unsupported on side of mat for at least 5" to demo improved trunk strength and core stabilization.   Baseline max assist needed on 10-28-16   Time 4   Period Weeks   Status New     PT SHORT TERM GOAL #5   Title Basic transfers from wheelchair to bed or commode with +1 mod assist.     Baseline max to total assist  - 10-28-16           PT Long Term Goals - 10/28/16 2004      PT LONG TERM GOAL #1   Title Pt will perform supine rolling R and L with min assist  to indicate improved independence with bed mobility.     Baseline dependent for all bed mobility per spouse's report - 10-28-16   Time 8   Period Weeks   Status New   Target Date 12/27/16      PT LONG TERM GOAL #2   Title Pt will sit on side of mat with RUE support for at least 5" with min assist.   Baseline max assist on 10-28-16   Time 8   Period Weeks   Status New   Target Date 12/27/16     PT LONG TERM GOAL #3   Title Pt will transfer Lt/Rt sidelying to sitting with min assist for incr. independence with getting out of bed.     Baseline max to total assist needed - 10-28-16   Time 8   Period Weeks   Status New   Target Date 12/27/16     PT LONG TERM GOAL #4   Title Pt will perform basic transfers with min to mod assist to indicate improved functional independence.     Baseline max assist - 08-26-16   Time 8   Period Weeks   Status New   Target Date 12/27/16     PT LONG TERM GOAL #5   Title Pt will perform static standing with bil. UE support with mod assist.     Baseline pt able to stand in standing frame with mod assist to minimize deviations of shoulders leaning to left side with pelvis shifted to right side- 10-28-16   Time 8   Period Weeks   Status New   Target Date 12/27/16     PT LONG TERM GOAL #7   Title Perform sit to stand and stand to sit with mod assist.     Baseline max to total assist needed on 10-28-16   Time 8   Period Weeks   Status New               Plan - 11/18/16 1700    Clinical Impression Statement Today's skilled  session continued to address posture, stretching and transfers. Pt required less overall assistance today with sit/stands after upper trunk stretching and balance work at edge of bed. Pt also progressed to taking 3-4 pivot steps mat to wheelchair at end of session. Pt is progressing well towards goals and should benefit from continued PT to progress toward unmet goals.    Rehab Potential Fair   Clinical Impairments Affecting Rehab Potential Chronicity of condition, continued medical management, but has very strong family and financial support   PT Frequency 3x / week   PT Duration 8 weeks   PT Treatment/Interventions  ADLs/Self Care Home Management;DME Instruction;Gait training;Stair training;Functional mobility training;Therapeutic activities;Patient/family education;Cognitive remediation;Neuromuscular re-education;Therapeutic exercise;Balance training;Orthotic Fit/Training;Wheelchair mobility training;Manual techniques;Splinting;Visual/perceptual remediation/compensation;Vestibular;Passive range of motion;Electrical Stimulation   PT Next Visit Plan cont to work on sitting balance, LE ROM as tolerated; partial squats as tolerated for LE weight-bearing, standing activities, including pregait as able   PT Home Exercise Plan family/caregiver has ROM exercises from previous admissions   Consulted and Agree with Plan of Care Patient;Family member/caregiver   Family Member Consulted caregiver [name]      Patient will benefit from skilled therapeutic intervention in order to improve the following deficits and impairments:  Abnormal gait, Decreased balance, Decreased endurance, Decreased cognition, Cardiopulmonary status limiting activity, Decreased activity tolerance, Decreased coordination, Decreased strength, Impaired flexibility, Impaired tone, Decreased mobility, Increased muscle spasms, Impaired vision/preception, Decreased range of motion, Impaired UE functional use, Postural dysfunction, Improper body mechanics, Impaired perceived functional ability, Decreased knowledge of precautions, Decreased knowledge of use of DME  Visit Diagnosis: Abnormal posture  Muscle weakness (generalized)  Other symptoms and signs involving the nervous system  Other abnormalities of gait and mobility     Problem List Patient Active Problem List   Diagnosis Date Noted  . Pseudomonas urinary tract infection 10/27/2016  . Severe muscle deconditioning 09/30/2016  . Pressure injury of skin 09/28/2016  . Respiratory failure ([place])   . Tracheobronchitis   . Urinary retention   . PEG (percutaneous endoscopic gastrostomy) status  ([place])   . Benign essential HTN   . Hyponatremia   . Hypokalemia   . Reactive hypertension   . Respiratory insufficiency 09/24/2016  . Malignant neoplasm of upper-inner quadrant of left breast in female, estrogen receptor positive ([place]) 07/14/2016  . Heterotopic ossification of bone 03/10/2016  . Acute on chronic respiratory failure with hypoxia ([facility])   . Acute hypoxemic respiratory failure ([place])   . Acute blood loss anemia   . Atelectasis 02/04/2016  . Debility 02/03/2016  . Pneumonia of both lower lobes due to Pseudomonas species ([place])   . Pneumonia of both lower lobes due to methicillin susceptible Staphylococcus aureus (MSSA) ([place])   . Acute respiratory failure with hypoxia ([place])   . Acute encephalopathy   . Seizures ([place])   . Sepsis ([place])   . Hypoxia   . Pain   . HCAP (healthcare-associated pneumonia) 12/28/2015  . Chronic respiratory failure ([place]) 12/28/2015  . Acute tracheobronchitis 12/24/2015  . Tracheostomy tube present ([place])   . Tracheal stenosis   . Chronic respiratory failure with hypoxia ([place])   . Spastic tetraplegia ([place]) 10/20/2015  . Tracheostomy status ([place]) 09/26/2015  . Allergic rhinitis 09/26/2015  . 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 Movement disorder 09/22/2015  . Encephalitis 09/22/2015  . Chest pain 02/07/2014  . Premature atrial contractions 01/13/2014  . Abnormality of gait 12/04/2013  . Hyperlipidemia 12/21/2011  . Cardiovascular risk factor 12/21/2011  . Bunion 01/31/2008  . Metatarsalgia  of both feet 09/20/2007  . FLAT FOOT 09/20/2007  . HALLUX RIGIDUS, ACQUIRED 09/20/2007    Willow Ora, PTA, [facility] [address], [place], [place] [address] [phone] 11/19/16, 9:26 AM   Name: Judy Spears MRN: [id number] Date of Birth: 1950/04/05

## 2016-11-19 NOTE — Therapy (Signed)
[facility] [address] [place], [place], [address] Phone: [phone]   Fax:  [phone]  Occupational Therapy Treatment  Patient Details  Name: Judy Spears MRN: [id number] Date of Birth: Jul 15, 1950 Referring Provider: Dr. [name]  Encounter Date: 11/19/2016      OT End of Session - 11/19/16 1658    Visit Number 4   Number of Visits 16   Date for OT Re-Evaluation 12/27/16   Authorization Type medicare will need PN and G code every 10th visit   Authorization Time Period 60 days   Authorization - Visit Number 4   Authorization - Number of Visits 10   OT Start Time 6384   OT Stop Time 1530   OT Time Calculation (min) 43 min   Activity Tolerance Patient tolerated treatment well   Behavior During Therapy [facility] for tasks assessed/performed      Past Medical History:  Diagnosis Date  . Arthritis    lt great toe  . Complication of anesthesia    pt has had headaches post op-did advise to hydra well preop  . Hair loss    right-sided  . History of exercise stress test    a. ETT (12/15) with 12:00 exercise, no ST changes, occasional PACs.   . Hyperlipidemia   . Insomnia   . Migraine headache   . Movement disorder    resltess in left legs  . Postmenopausal   . 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 Premature atrial contractions    a. Holter (12/15) with 8% PACs, no atrial runs or atrial fibrillation.     Past Surgical History:  Procedure Laterality Date  . BREAST BIOPSY  01/01/2011   Procedure: BREAST BIOPSY WITH NEEDLE LOCALIZATION;  Surgeon: Rolm Bookbinder, MD;  Location: [facility];  Service: General;  Laterality: Left;  left breast wire localization biopsy  . BREAST BIOPSY Right 07/27/2012  . BREAST BIOPSY Right 07/07/2016  . BREAST BIOPSY Left 06/30/2016  . cataracts right eye    . CESAREAN SECTION  1986  . HERNIA REPAIR  2000   RIH  . IR [place] TUBE PERCUT W/FLUORO   09/29/2016  . opirectinal membrane peel    . PEG PLACEMENT    . RHINOPLASTY  1983  . TRACHEOSTOMY      There were no vitals filed for this visit.      Subjective Assessment - 11/19/16 1650    Subjective  ok   Patient is accompained by: Family member  caregiver   Pertinent History see epic snapshot - ABI/encephalitis/hypoxia from virus; recent hospitalization due to MRSA PNA    Patient Stated Goals Pt unable to state.    Currently in Pain? No/denies   Pain Score 0-No pain                      OT Treatments/Exercises (OP) - 11/19/16 0001      Neurological Re-education Exercises   Other Exercises 1 Worked to provide greater repetoire of trunk and lower trunk initiated movement by working seated on physioball.  Patient alert with eyes open most of this session, and seemed to close eyes in response to overstimulation or aversion.  Patient able to initiate movement of ball toward left, easier than toward right.  Patient had difficulty with trunk shortening on left side versus right side.  Patient able to activate trunk extension if upper extremities supported, but only momentarilly.  Patient's preferred pattern was to pull downward  with arms, especially right and trunk into more flexed posture.     Other Exercises 2 Worked on standing today with emphsis on alignment.  Patient with knee and hip flexion on left, rotation backward on left, and trunk shortening to left.  Worked to provide more midline orientation, and controlled stand to sit.                      OT Short Term Goals - 11/16/16 1505      OT SHORT TERM GOAL #1   Title Pt, caregiver and husband will be mod I with HEP - 11/29/2016   Status On-going     OT SHORT TERM GOAL #2   Title Pt will consistently require max assist x1 from caregiver or husband for basic transfers (currently total A x 2 for untrained health professional)   Status On-going     OT SHORT TERM GOAL #3   Title Pt will sit on level  surfaces in preparation for participation in ADL tasks with intermittent min a and mod cues after preparation.   Status On-going     OT SHORT TERM GOAL #4   Title Pt will require no more than total assist x1 from family/caregiver to use steady assist for basic transfers (not toilet or shower transfers).     Status On-going     OT SHORT TERM GOAL #5   Title Pt will demonstrate ability to follow simple, functional commands in context at least 50% of the time.    Status On-going           OT Long Term Goals - 11/16/16 1506      OT LONG TERM GOAL #1   Title Pt , husband and caregiver will be mod I with upgraded HEP prn - 12/27/2016   Status On-going     OT LONG TERM GOAL #2   Title Pt will will demonstrate ability to sit on even surface with supervision while engaging in simple activity   Status On-going     OT LONG TERM GOAL #3   Title Pt will be mod a for squat pivot basic transfers   Status On-going     OT LONG TERM GOAL #4   Title Pt will be mod a x1 to use steady for toilet transfers   Status On-going     OT LONG TERM GOAL #5   Title Pt will be max a x1 for clothing mgmt using steady for toilet transfers   Status On-going     OT LONG TERM GOAL #6   Title Pt will demonstrate ability to don shirt over both arms with set up.    Status On-going     OT LONG TERM GOAL #7   Title Pt will demonstrate ability to sit with supevision in wheelchair shower seat during shower.    Status On-going               Plan - 11/19/16 1659    Clinical Impression Statement Patient with severely impaired postural control, and endurance.  Patient is showing some improvement in static sitting balance following manual assistance / facilitation.     Rehab Potential Fair   Current Impairments/barriers affecting progress: severity of deficits, very slow rate of recovery; pt with recent significant decline over past several months due medical complications   OT Frequency 3x / week   OT  Duration 8 weeks   OT Treatment/Interventions Self-care/ADL training;Ultrasound;Moist Heat;Electrical Stimulation;DME and/or AE instruction;Neuromuscular education;Therapeutic exercise;Functional Mobility  Training;Manual Therapy;Passive range of motion;Splinting;Therapeutic activities;Balance training;Patient/family education;Visual/perceptual remediation/compensation;Cognitive remediation/compensation   Plan NMR trunk, sitting balance, weight shifting- initiated by patient, sit to stand, wheelchair positioning   Consulted and Agree with Plan of Care Patient;Family member/caregiver   Family Member Consulted caregiver [place], Husband [name]      Patient will benefit from skilled therapeutic intervention in order to improve the following deficits and impairments:  Decreased endurance, Decreased skin integrity, Impaired vision/preception, Improper body mechanics, Impaired perceived functional ability, Decreased knowledge of precautions, Cardiopulmonary status limiting activity, Decreased activity tolerance, Decreased knowledge of use of DME, Decreased strength, Impaired flexibility, Improper spinal/pelvic alignment, Impaired sensation, Difficulty walking, Decreased mobility, Decreased balance, Decreased cognition, Decreased range of motion, Impaired tone, Impaired UE functional use, Decreased safety awareness, Decreased coordination, Pain, Abnormal gait  Visit Diagnosis: Abnormal posture  Muscle weakness (generalized)  Other symptoms and signs involving the nervous system  Stiffness of left shoulder, not elsewhere classified  Stiffness of right shoulder, not elsewhere classified  Stiffness of left elbow, not elsewhere classified  Frontal lobe and executive function deficit  Other lack of coordination  Other abnormal involuntary movements    Problem List Patient Active Problem List   Diagnosis Date Noted  . Pseudomonas urinary tract infection 10/27/2016  . Severe muscle deconditioning  09/30/2016  . Pressure injury of skin 09/28/2016  . Respiratory failure ([place])   . Tracheobronchitis   . Urinary retention   . PEG (percutaneous endoscopic gastrostomy) status ([place])   . Benign essential HTN   . Hyponatremia   . Hypokalemia   . Reactive hypertension   . Respiratory insufficiency 09/24/2016  . Malignant neoplasm of upper-inner quadrant of left breast in female, estrogen receptor positive ([place]) 07/14/2016  . Heterotopic ossification of bone 03/10/2016  . Acute on chronic respiratory failure with hypoxia ([place])   . Acute hypoxemic respiratory failure ([place])   . Acute blood loss anemia   . Atelectasis 02/04/2016  . Debility 02/03/2016  . Pneumonia of both lower lobes due to Pseudomonas species ([place])   . Pneumonia of both lower lobes due to methicillin susceptible Staphylococcus aureus (MSSA) ([place])   . Acute respiratory failure with hypoxia ([place])   . Acute encephalopathy   . Seizures ([place])   . Sepsis ([place])   . Hypoxia   . Pain   . HCAP (healthcare-associated pneumonia) 12/28/2015  . Chronic respiratory failure ([place]) 12/28/2015  . Acute tracheobronchitis 12/24/2015  . Tracheostomy tube present ([place])   . Tracheal stenosis   . Chronic respiratory failure with hypoxia ([place])   . Spastic tetraplegia ([place]) 10/20/2015  . Tracheostomy status ([place]) 09/26/2015  . Allergic rhinitis 09/26/2015  . 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 Movement disorder 09/22/2015  . Encephalitis 09/22/2015  . Chest pain 02/07/2014  . Premature atrial contractions 01/13/2014  . Abnormality of gait 12/04/2013  . Hyperlipidemia 12/21/2011  . Cardiovascular risk factor 12/21/2011  . Bunion 01/31/2008  . Metatarsalgia of both feet 09/20/2007  . FLAT FOOT 09/20/2007  . HALLUX RIGIDUS, ACQUIRED 09/20/2007    Mariah Milling, OTR/L 11/19/2016, 5:03 PM  [facility] [address] [place], [place], [address] Phone:  [phone]   Fax:  [phone]  Name: Judy Spears MRN: [id number] Date of Birth: Dec 25, 1950

## 2016-11-20 NOTE — Therapy (Signed)
[facility] [address] [place], [place], [address] Phone: [phone]   Fax:  [phone]  Physical Therapy Treatment  Patient Details  Name: Judy Spears MRN: [id number] Date of Birth: Mar 31, 1950 Referring Provider: Alger Simons, MD  Encounter Date: 11/19/2016      PT End of Session - 11/19/16 1617    Visit Number 6   Number of Visits 20   Date for PT Re-Evaluation 12/27/16   Authorization Type U[employer] MCR   PT Start Time 6962   PT Stop Time 1613   PT Time Calculation (min) 43 min   Activity Tolerance Patient tolerated treatment well   Behavior During Therapy [facility] for tasks assessed/performed      Past Medical History:  Diagnosis Date  . Arthritis    lt great toe  . Complication of anesthesia    pt has had headaches post op-did advise to hydra well preop  . Hair loss    right-sided  . History of exercise stress test    a. ETT (12/15) with 12:00 exercise, no ST changes, occasional PACs.   . Hyperlipidemia   . Insomnia   . Migraine headache   . Movement disorder    resltess in left legs  . Postmenopausal   . 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 Premature atrial contractions    a. Holter (12/15) with 8% PACs, no atrial runs or atrial fibrillation.     Past Surgical History:  Procedure Laterality Date  . BREAST BIOPSY  01/01/2011   Procedure: BREAST BIOPSY WITH NEEDLE LOCALIZATION;  Surgeon: Rolm Bookbinder, MD;  Location: [place];  Service: General;  Laterality: Left;  left breast wire localization biopsy  . BREAST BIOPSY Right 07/27/2012  . BREAST BIOPSY Right 07/07/2016  . BREAST BIOPSY Left 06/30/2016  . cataracts right eye    . CESAREAN SECTION  1986  . HERNIA REPAIR  2000   RIH  . IR [place] TUBE PERCUT W/FLUORO  09/29/2016  . opirectinal membrane peel    . PEG PLACEMENT    . RHINOPLASTY  1983  . TRACHEOSTOMY      There were no vitals filed for this  visit.      Subjective Assessment - 11/19/16 1700    Subjective No new complaints. Pt denied any pain (nodded head no).    Patient is accompained by: Family member  [name], [name]- caregiver   Pertinent History Precautions:  PEG.  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  Another round of botox in LLE on 02/06/16   Limitations House hold activities;Walking;Standing;Sitting;Lifting;Writing   Patient Stated Goals Per husband, "For Judy Spears to be as independent as possible."   Currently in Pain? No/denies   Pain Score 0-No pain           [facility] Adult PT Treatment/Exercise - 11/19/16 1611      Transfers   Transfers Stand to Sit   Sit to Stand 3: Mod assist;With upper extremity assist;From chair/3-in-1;From bed   Sit to Stand Details Manual facilitation for weight shifting;Verbal cues for sequencing;Verbal cues for technique;Manual facilitation for weight bearing;Tactile cues for sequencing;Tactile cues for posture   Stand to Sit 3: Mod assist;4: Min assist;With upper extremity assist;To bed;To chair/3-in-1   Stand to Sit Details (indicate cue type and reason) Tactile cues for initiation;Tactile cues for weight  shifting;Verbal cues for sequencing;Verbal cues for technique;Manual facilitation for weight shifting   Comments sit<>stands performed 4 reps with decreased assistance each time. in standing worked on hip extension, left knee extension and trunk extension/shoulder back with 2 person assist to facilitate all the above. once posture was as good as it was able to be worked on lateral weight shifitng, then back in midline worked on alternating UE raises off of PTA shoulders, progressing to left LE weight bearing with right foot stepping fwd/bwd x2 short/small steps. all with mod/max assist for balance of 1  person with both persons assisting to maintain posture.     Ambulation/Gait   Ambulation/Gait Yes   Ambulation/Gait Assistance 2: Max assist;3: Mod assist  2 person assitance   Ambulation/Gait Assistance Details PTA underneath left axilla/UE (3 muskateer style) with SPTA on other side with HHA with one hand/other arm around back of pt under neath PTA's other arm. pt able to self advance left leg with assist for step placement and min assist needed for step placement and to clear floor with right leg. 5 feet from mat to wheelchair performed today after pregait activities in standing.                                Ambulation Distance (Feet) 5 Feet   Assistive device 2 person hand held assist   Gait Pattern Decreased step length - right;Decreased step length - left;Left flexed knee in stance;Ataxic;Decreased trunk rotation;Trunk rotated posteriorly on left;Trunk flexed;Narrow base of support   Ambulation Surface Level;Indoor     Neuro Re-ed    Neuro Re-ed Details  seated edge of mat: pt noted to have significant lean toward left side. worked on right elbow taps down to mat with elbow extension to right herself into sitting. min/mod assist of 1 person for balance and faciliation of motions, performed x 5 reps. then had pt prop onto right elbow with gentle overpressure applied to left hip in posterior direction and at flank just under pt's left arm in anterior/posterior directions to apply a gentle upper trunk stretch for postural correction x 4-5 reps. once pt returned to upright posture pt demo'd improved midline positioning with close supervision for static sitting balance, any movement and pt required hands on assistance for balance. in midline that was achieved-  worked on anterior weight shifting via "push me away", then "pull me to you" cues x 8 reps prior to standing. assistance needed to maintain balance and midline with via facilitation to trunk (2 person's used for facilitation with edge of mat  activities)                                  PT Short Term Goals - 10/28/16 1950      PT SHORT TERM GOAL #1   Title Perform sit to/from stand from wheelchair or mat with +1 mod assist.   Baseline max to total assist on 10-28-16   Time 4   Period Weeks   Status New   Target Date 11/27/16     PT SHORT TERM GOAL #2   Title Pt will perform sit to stand from wheelchair to RW with mod assist.   Baseline max to total assist - 10-28-16   Time 4   Period Weeks   Status New   Target Date 11/27/16     PT SHORT TERM  GOAL #3   Title Pt will improve standing balance/trunk control so that caregiver [name]) is able to transfer pt using [place] without requiring a 2nd person for assist.     Baseline 2 people required with total assist - Charlaine [place] is unable to be used at this time - 10-28-16   Time 4   Period Weeks   Status New   Target Date 11/27/16     PT SHORT TERM GOAL #4   Title Pt will sit with RUE support with mod assist unsupported on side of mat for at least 5" to demo improved trunk strength and core stabilization.   Baseline max assist needed on 10-28-16   Time 4   Period Weeks   Status New     PT SHORT TERM GOAL #5   Title Basic transfers from wheelchair to bed or commode with +1 mod assist.     Baseline max to total assist  - 10-28-16           PT Long Term Goals - 10/28/16 2004      PT LONG TERM GOAL #1   Title Pt will perform supine rolling R and L with min assist  to indicate improved independence with bed mobility.     Baseline dependent for all bed mobility per spouse's report - 10-28-16   Time 8   Period Weeks   Status New   Target Date 12/27/16     PT LONG TERM GOAL #2   Title Pt will sit on side of mat with RUE support for at least 5" with min assist.   Baseline max assist on 10-28-16   Time 8   Period Weeks   Status New   Target Date 12/27/16     PT LONG TERM GOAL #3   Title Pt will transfer Lt/Rt sidelying to sitting with min assist for incr. independence  with getting out of bed.     Baseline max to total assist needed - 10-28-16   Time 8   Period Weeks   Status New   Target Date 12/27/16     PT LONG TERM GOAL #4   Title Pt will perform basic transfers with min to mod assist to indicate improved functional independence.     Baseline max assist - 08-26-16   Time 8   Period Weeks   Status New   Target Date 12/27/16     PT LONG TERM GOAL #5   Title Pt will perform static standing with bil. UE support with mod assist.     Baseline pt able to stand in standing frame with mod assist to minimize deviations of shoulders leaning to left side with pelvis shifted to right side- 10-28-16   Time 8   Period Weeks   Status New   Target Date 12/27/16     PT LONG TERM GOAL #7   Title Perform sit to stand and stand to sit with mod assist.     Baseline max to total assist needed on 10-28-16   Time 8   Period Weeks   Status New            Plan - 11/19/16 1617    Clinical Impression Statement Pt continued to be mostly alert with session today. Two instances of pt closing eyes at edge of mat in which she reopended with asked if she was ready to stand again. Pt denied pain through out session and smiled at jokes her spouse would make about trick or treaters  and the "[place] nod". Today's skilled session continued to focus on transfers, posture and pre gait activities with in session progress made for decreased assistance needed at times. Pt is progressing toward goals and should benefit from continued PT to progress toward unmet goals.    Rehab Potential Fair   Clinical Impairments Affecting Rehab Potential Chronicity of condition, continued medical management, but has very strong family and financial support   PT Frequency 3x / week   PT Duration 8 weeks   PT Treatment/Interventions ADLs/Self Care Home Management;DME Instruction;Gait training;Stair training;Functional mobility training;Therapeutic activities;Patient/family education;Cognitive  remediation;Neuromuscular re-education;Therapeutic exercise;Balance training;Orthotic Fit/Training;Wheelchair mobility training;Manual techniques;Splinting;Visual/perceptual remediation/compensation;Vestibular;Passive range of motion;Electrical Stimulation   PT Next Visit Plan cont to work on sitting balance, LE ROM as tolerated; partial squats as tolerated for LE weight-bearing, standing activities, including pregait as able   PT Home Exercise Plan family/caregiver has ROM exercises from previous admissions   Consulted and Agree with Plan of Care Patient;Family member/caregiver   Family Member Consulted caregiver [name]      Patient will benefit from skilled therapeutic intervention in order to improve the following deficits and impairments:  Abnormal gait, Decreased balance, Decreased endurance, Decreased cognition, Cardiopulmonary status limiting activity, Decreased activity tolerance, Decreased coordination, Decreased strength, Impaired flexibility, Impaired tone, Decreased mobility, Increased muscle spasms, Impaired vision/preception, Decreased range of motion, Impaired UE functional use, Postural dysfunction, Improper body mechanics, Impaired perceived functional ability, Decreased knowledge of precautions, Decreased knowledge of use of DME  Visit Diagnosis: Abnormal posture  Muscle weakness (generalized)  Other symptoms and signs involving the nervous system  Other abnormal involuntary movements  Other abnormalities of gait and mobility     Problem List Patient Active Problem List   Diagnosis Date Noted  . Pseudomonas urinary tract infection 10/27/2016  . Severe muscle deconditioning 09/30/2016  . Pressure injury of skin 09/28/2016  . Respiratory failure ([place])   . Tracheobronchitis   . Urinary retention   . PEG (percutaneous endoscopic gastrostomy) status ([place])   . Benign essential HTN   . Hyponatremia   . Hypokalemia   . Reactive hypertension   . Respiratory insufficiency  09/24/2016  . Malignant neoplasm of upper-inner quadrant of left breast in female, estrogen receptor positive ([place]) 07/14/2016  . Heterotopic ossification of bone 03/10/2016  . Acute on chronic respiratory failure with hypoxia ([place])   . Acute hypoxemic respiratory failure ([place])   . Acute blood loss anemia   . Atelectasis 02/04/2016  . Debility 02/03/2016  . Pneumonia of both lower lobes due to Pseudomonas species ([place])   . Pneumonia of both lower lobes due to methicillin susceptible Staphylococcus aureus (MSSA) ([place])   . Acute respiratory failure with hypoxia ([place])   . Acute encephalopathy   . Seizures ([place])   . Sepsis ([place])   . Hypoxia   . Pain   . HCAP (healthcare-associated pneumonia) 12/28/2015  . Chronic respiratory failure ([place]) 12/28/2015  . Acute tracheobronchitis 12/24/2015  . Tracheostomy tube present ([place])   . Tracheal stenosis   . Chronic respiratory failure with hypoxia ([place])   . Spastic tetraplegia ([place]) 10/20/2015  . Tracheostomy status ([place]) 09/26/2015  . Allergic rhinitis 09/26/2015  . 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 Movement disorder 09/22/2015  . Encephalitis 09/22/2015  . Chest pain 02/07/2014  . Premature atrial contractions 01/13/2014  . Abnormality of gait 12/04/2013  . Hyperlipidemia 12/21/2011  . Cardiovascular risk factor 12/21/2011  . Bunion 01/31/2008  . Metatarsalgia of both feet 09/20/2007  . FLAT FOOT 09/20/2007  .  HALLUX RIGIDUS, ACQUIRED 09/20/2007    Willow Ora, PTA, [facility] [address], [place], [place] [address] [phone] 11/20/16, 1:49 PM   Name: Marinell Igarashi MRN: [id number] Date of Birth: [birth year]

## 2016-11-22 ENCOUNTER — Encounter: Payer: Self-pay | Admitting: Occupational Therapy

## 2016-11-22 ENCOUNTER — Encounter: Payer: Self-pay | Admitting: Physical Therapy

## 2016-11-22 ENCOUNTER — Ambulatory Visit: Payer: Medicare Other | Admitting: Speech Pathology

## 2016-11-22 ENCOUNTER — Ambulatory Visit: Payer: Medicare Other | Admitting: Physical Therapy

## 2016-11-22 ENCOUNTER — Ambulatory Visit: Payer: Medicare Other | Admitting: Occupational Therapy

## 2016-11-22 DIAGNOSIS — M25622 Stiffness of left elbow, not elsewhere classified: Secondary | ICD-10-CM

## 2016-11-22 DIAGNOSIS — R41844 Frontal lobe and executive function deficit: Secondary | ICD-10-CM

## 2016-11-22 DIAGNOSIS — M6281 Muscle weakness (generalized): Secondary | ICD-10-CM

## 2016-11-22 DIAGNOSIS — R29818 Other symptoms and signs involving the nervous system: Secondary | ICD-10-CM

## 2016-11-22 DIAGNOSIS — R2689 Other abnormalities of gait and mobility: Secondary | ICD-10-CM | POA: Diagnosis not present

## 2016-11-22 DIAGNOSIS — M25612 Stiffness of left shoulder, not elsewhere classified: Secondary | ICD-10-CM

## 2016-11-22 DIAGNOSIS — R293 Abnormal posture: Secondary | ICD-10-CM

## 2016-11-22 DIAGNOSIS — M25611 Stiffness of right shoulder, not elsewhere classified: Secondary | ICD-10-CM

## 2016-11-22 DIAGNOSIS — R471 Dysarthria and anarthria: Secondary | ICD-10-CM

## 2016-11-22 DIAGNOSIS — R258 Other abnormal involuntary movements: Secondary | ICD-10-CM

## 2016-11-22 NOTE — Therapy (Signed)
[facility] [address] [place], [place], [address] Phone: [phone]   Fax:  [phone]  Occupational Therapy Treatment  Patient Details  Name: Judy Spears MRN: [id number] Date of Birth: 1950-03-11 Referring Provider: Dr. [name]  Encounter Date: 11/22/2016      OT End of Session - 11/22/16 1504    Visit Number 5   Number of Visits 16   Date for OT Re-Evaluation 12/27/16   Authorization Type medicare will need PN and G code every 10th visit   Authorization Time Period 60 days   Authorization - Visit Number 5   Authorization - Number of Visits 10   OT Start Time 3716   OT Stop Time 1445   OT Time Calculation (min) 40 min      Past Medical History:  Diagnosis Date  . Arthritis    lt great toe  . Complication of anesthesia    pt has had headaches post op-did advise to hydra well preop  . Hair loss    right-sided  . History of exercise stress test    a. ETT (12/15) with 12:00 exercise, no ST changes, occasional PACs.   . Hyperlipidemia   . Insomnia   . Migraine headache   . Movement disorder    resltess in left legs  . Postmenopausal   . 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 Premature atrial contractions    a. Holter (12/15) with 8% PACs, no atrial runs or atrial fibrillation.     Past Surgical History:  Procedure Laterality Date  . BREAST BIOPSY  01/01/2011   Procedure: BREAST BIOPSY WITH NEEDLE LOCALIZATION;  Surgeon: Rolm Bookbinder, MD;  Location: [facility];  Service: General;  Laterality: Left;  left breast wire localization biopsy  . BREAST BIOPSY Right 07/27/2012  . BREAST BIOPSY Right 07/07/2016  . BREAST BIOPSY Left 06/30/2016  . cataracts right eye    . CESAREAN SECTION  1986  . HERNIA REPAIR  2000   RIH  . IR [place] TUBE PERCUT W/FLUORO  09/29/2016  . opirectinal membrane peel    . PEG PLACEMENT    . RHINOPLASTY  1983  . TRACHEOSTOMY      There were  no vitals filed for this visit.      Subjective Assessment - 11/22/16 1408    Subjective  "1,2,3,4...Marland KitchenMarland Kitchen"when count for stretching   Patient is accompained by: Family member  husband chip and caregiver Leesa   Pertinent History see epic snapshot - ABI/encephalitis/hypoxia from virus; recent hospitalization due to MRSA PNA    Patient Stated Goals Pt unable to state.    Currently in Pain? No/denies                      OT Treatments/Exercises (OP) - 11/22/16 0001      Neurological Re-education Exercises   Other Exercises 1 Neuro re ed to address squat pivot transfers wheelchair to mat - total assist.  Addressed forward weight shifting prior to transfer .  Addressed finding midline in sitting and moving from R side lean to midline.  After several repetitions pt able to hold  midline with supervision and no UE support for brief periods.  Transitioned into supine on wedge to address trunk/hip alignment - pt with L hip contracture with LLE positioned in ER and hip flexion.  Pt able to assist with active IR as well as guided hip and knee extension.  Discussed with husband and caregiver positioning  pt with blanket roll in supine to reduce external rotation of L hip as well as sidelying on R side with pillow between knees to encourage neutral position of L hip.  Husband and caregiver both verbalized understanding.                  OT Education - 11/22/16 1502    Education provided Yes   Education Details bed positioning   Person(s) Educated Patient;Spouse;Caregiver(s)   Methods Explanation;Demonstration   Comprehension Verbalized understanding          OT Short Term Goals - 11/22/16 1502      OT SHORT TERM GOAL #1   Title Pt, caregiver and husband will be mod I with HEP - 11/29/2016   Status On-going     OT SHORT TERM GOAL #2   Title Pt will consistently require max assist x1 from caregiver or husband for basic transfers (currently total A x 2 for untrained health  professional)   Status On-going     OT SHORT TERM GOAL #3   Title Pt will sit on level surfaces in preparation for participation in ADL tasks with intermittent min a and mod cues after preparation.   Status On-going     OT SHORT TERM GOAL #4   Title Pt will require no more than total assist x1 from family/caregiver to use steady assist for basic transfers (not toilet or shower transfers).     Status On-going     OT SHORT TERM GOAL #5   Title Pt will demonstrate ability to follow simple, functional commands in context at least 50% of the time.    Status On-going           OT Long Term Goals - 11/22/16 1503      OT LONG TERM GOAL #1   Title Pt , husband and caregiver will be mod I with upgraded HEP prn - 12/27/2016   Status On-going     OT LONG TERM GOAL #2   Title Pt will will demonstrate ability to sit on even surface with supervision while engaging in simple activity   Status On-going     OT LONG TERM GOAL #3   Title Pt will be mod a for squat pivot basic transfers   Status On-going     OT LONG TERM GOAL #4   Title Pt will be mod a x1 to use steady for toilet transfers   Status On-going     OT LONG TERM GOAL #5   Title Pt will be max a x1 for clothing mgmt using steady for toilet transfers   Status On-going     OT LONG TERM GOAL #6   Title Pt will demonstrate ability to don shirt over both arms with set up.    Status On-going     OT LONG TERM GOAL #7   Title Pt will demonstrate ability to sit with supevision in wheelchair shower seat during shower.    Status On-going               Plan - 11/22/16 1503    Clinical Impression Statement Pt with very slow progress toward goals. Pt more alert today and demonstrated some improvement in sitting balance after prep.   Rehab Potential Fair   Current Impairments/barriers affecting progress: severity of deficits, very slow rate of recovery; pt with recent significant decline over past several months due medical  complications   OT Frequency 3x / week   OT Duration 8 weeks  OT Treatment/Interventions Self-care/ADL training;Ultrasound;Moist Heat;Electrical Stimulation;DME and/or AE instruction;Neuromuscular education;Therapeutic exercise;Functional Mobility Training;Manual Therapy;Passive range of motion;Splinting;Therapeutic activities;Balance training;Patient/family education;Visual/perceptual remediation/compensation;Cognitive remediation/compensation   Plan NMR trunk, sitting balance, weight shifting initiated by pt, sit to stand, wheelchair positioning, bed positioning   Consulted and Agree with Plan of Care Patient;Family member/caregiver   Family Member Consulted caregiver [place], Husband [name]      Patient will benefit from skilled therapeutic intervention in order to improve the following deficits and impairments:  Decreased endurance, Decreased skin integrity, Impaired vision/preception, Improper body mechanics, Impaired perceived functional ability, Decreased knowledge of precautions, Cardiopulmonary status limiting activity, Decreased activity tolerance, Decreased knowledge of use of DME, Decreased strength, Impaired flexibility, Improper spinal/pelvic alignment, Impaired sensation, Difficulty walking, Decreased mobility, Decreased balance, Decreased cognition, Decreased range of motion, Impaired tone, Impaired UE functional use, Decreased safety awareness, Decreased coordination, Pain, Abnormal gait  Visit Diagnosis: Abnormal posture  Muscle weakness (generalized)  Other symptoms and signs involving the nervous system  Stiffness of left shoulder, not elsewhere classified  Stiffness of right shoulder, not elsewhere classified  Stiffness of left elbow, not elsewhere classified  Frontal lobe and executive function deficit    Problem List Patient Active Problem List   Diagnosis Date Noted  . Pseudomonas urinary tract infection 10/27/2016  . Severe muscle deconditioning 09/30/2016  .  Pressure injury of skin 09/28/2016  . Respiratory failure ([place])   . Tracheobronchitis   . Urinary retention   . PEG (percutaneous endoscopic gastrostomy) status ([place])   . Benign essential HTN   . Hyponatremia   . Hypokalemia   . Reactive hypertension   . Respiratory insufficiency 09/24/2016  . Malignant neoplasm of upper-inner quadrant of left breast in female, estrogen receptor positive ([place]) 07/14/2016  . Heterotopic ossification of bone 03/10/2016  . Acute on chronic respiratory failure with hypoxia ([place])   . Acute hypoxemic respiratory failure ([place])   . Acute blood loss anemia   . Atelectasis 02/04/2016  . Debility 02/03/2016  . Pneumonia of both lower lobes due to Pseudomonas species ([place])   . Pneumonia of both lower lobes due to methicillin susceptible Staphylococcus aureus (MSSA) ([place])   . Acute respiratory failure with hypoxia ([place])   . Acute encephalopathy   . Seizures ([place])   . Sepsis ([place])   . Hypoxia   . Pain   . HCAP (healthcare-associated pneumonia) 12/28/2015  . Chronic respiratory failure ([place]) 12/28/2015  . Acute tracheobronchitis 12/24/2015  . Tracheostomy tube present ([place])   . Tracheal stenosis   . Chronic respiratory failure with hypoxia ([place])   . Spastic tetraplegia ([place]) 10/20/2015  . Tracheostomy status ([place]) 09/26/2015  . Allergic rhinitis 09/26/2015  . 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 Movement disorder 09/22/2015  . Encephalitis 09/22/2015  . Chest pain 02/07/2014  . Premature atrial contractions 01/13/2014  . Abnormality of gait 12/04/2013  . Hyperlipidemia 12/21/2011  . Cardiovascular risk factor 12/21/2011  . Bunion 01/31/2008  . Metatarsalgia of both feet 09/20/2007  . FLAT FOOT 09/20/2007  . HALLUX RIGIDUS, ACQUIRED 09/20/2007    Quay Burow, OTR/L 11/22/2016, 3:06 PM  [facility] [address] [place], [place], [address] Phone: [phone]    Fax:  [phone]  Name: Judy Spears MRN: [id number] Date of Birth: 1950-11-06

## 2016-11-22 NOTE — Therapy (Signed)
[facility] [address] [place], [place], [address] Phone: [phone]   Fax:  [phone]  Speech Language Pathology Treatment  Patient Details  Name: Judy Spears MRN: [id number] Date of Birth: 02-28-1950 Referring Provider: Dr. Quita Skye. [name]  Encounter Date: 11/22/2016      End of Session - 11/22/16 1548    Visit Number 4   Number of Visits 17   Date for SLP Re-Evaluation 01/05/17   SLP Start Time 1447   SLP Stop Time  1530   SLP Time Calculation (min) 43 min   Activity Tolerance Patient tolerated treatment well;Patient limited by lethargy      Past Medical History:  Diagnosis Date  . Arthritis    lt great toe  . Complication of anesthesia    pt has had headaches post op-did advise to hydra well preop  . Hair loss    right-sided  . History of exercise stress test    a. ETT (12/15) with 12:00 exercise, no ST changes, occasional PACs.   . Hyperlipidemia   . Insomnia   . Migraine headache   . Movement disorder    resltess in left legs  . Postmenopausal   . 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 Premature atrial contractions    a. Holter (12/15) with 8% PACs, no atrial runs or atrial fibrillation.     Past Surgical History:  Procedure Laterality Date  . BREAST BIOPSY  01/01/2011   Procedure: BREAST BIOPSY WITH NEEDLE LOCALIZATION;  Surgeon: Rolm Bookbinder, MD;  Location: [facility];  Service: General;  Laterality: Left;  left breast wire localization biopsy  . BREAST BIOPSY Right 07/27/2012  . BREAST BIOPSY Right 07/07/2016  . BREAST BIOPSY Left 06/30/2016  . cataracts right eye    . CESAREAN SECTION  1986  . HERNIA REPAIR  2000   RIH  . IR [place] TUBE PERCUT W/FLUORO  09/29/2016  . opirectinal membrane peel    . PEG PLACEMENT    . RHINOPLASTY  1983  . TRACHEOSTOMY      There were no vitals filed for this visit.      Subjective Assessment - 11/22/16 1539     Subjective Arrives from OT, alert   Patient is accompained by: Family member   Special Tests husband [name], caregiver Leesa   Currently in Pain? No/denies               ADULT SLP TREATMENT - 11/22/16 0001      General Information   Behavior/Cognition Cooperative;Distractible;Requires cueing;Pleasant mood;Lethargic   Patient Positioning Upright in chair     Treatment Provided   Treatment provided Cognitive-Linquistic     Pain Assessment   Pain Assessment No/denies pain     Cognitive-Linquistic Treatment   Treatment focused on Voice;Dysarthria   Skilled Treatment Pt limited today by lethargy, required cues (verbal, tactile) for attention and alertness every 1-2 minutes, even with change of therapy environment to outdoors. Targeted automatic speech, pt with adequate voicing, articulation for 75% of single words initial max A (imitation) for coordination of respiration/phonation. SLP faded cues to usual min A; pt voiced numbers 1-10. Sustained /a/ averaged 3.5 seconds with usual mod A. Pt requiring cues for managing saliva; swallowed on command in 3/10 opportunities today even with max tactile/demonstration/verbal cues. Attempted IMT however pt unable to achieve adequate lip seal due to excessive saliva.     Tracheostomy Shiley 4 mm Uncuffed   Tracheostomy Properties Placement Date: 11/11/16 Placement  Time: 1510 Inserted prior to hospital arrival?: Yes Brand: Shiley Size (mm): 4 mm Style: Uncuffed   Status Capped;Secured   Site Assessment Clean;Dry   Ties Assessment Clean;Dry;Secure     Assessment / Recommendations / Plan   Plan Continue with current plan of care     Dysphagia Recommendations   Diet recommendations NPO;Other(comment)  ice chips after oral care   Liquids provided via Teaspoon   Medication Administration Via alternative means   Supervision Full supervision/cueing for compensatory strategies     Progression Toward Goals   Progression toward goals  Progressing toward goals     General Information   Reason PO's not observed Other (comment)  Targeted communication only today          SLP Education - 11/22/16 1548    Education provided Yes   Education Details sustained /a/, vowels at home   Person(s) Educated Patient;Spouse;Caregiver(s)   Methods Explanation;Demonstration   Comprehension Verbalized understanding          SLP Short Term Goals - 11/22/16 1551      SLP SHORT TERM GOAL #1   Title Pt will demonstrate inspiratory and blowing exercises to increase respiratory support and coordination for vocalizations with usual mod A   Time 3   Period Weeks   Status On-going     SLP SHORT TERM GOAL #2   Title Pt will achieve audible phonation with prolonged vowels and/or CV syllables in 75% of trials with usual mod A.    Time 3   Period Weeks   Status On-going     SLP SHORT TERM GOAL #3   Title Pt will coordinate respiration/phonation for imitation of single words in 75% of trials with usual mod A.    Baseline 11/22/16   Time 3   Period Weeks   Status On-going     SLP SHORT TERM GOAL #4   Title Pt will sustain attention to therapy tasks for 7 minutes with mod A multimodal cues.    Time 3   Period Weeks   Status On-going     SLP SHORT TERM GOAL #5   Title Pt will participate in thorough oral care regimen at least 4 times per day and prior to trials of ice chips per caregiver report over 3 sessions.   Baseline 10.22.18   Time 3   Period Weeks   Status On-going          SLP Long Term Goals - 11/22/16 1552      SLP LONG TERM GOAL #1   Title Pt will demonstrate cough/swallow for secretion management with min A from caregiver over 5 sessions.   Time 7   Period Weeks   Status On-going     SLP LONG TERM GOAL #2   Title Pt will demonstrate audible phonation/intelligible speech for 2 word phrases with usual min A in 7/10 trials.   Time 7   Period Weeks   Status On-going     SLP LONG TERM GOAL #3   Title Pt  will communicate a basic want/ need by any functional means, including AAC, with mod A from caregivers over 3 sessions.   Time 7   Period Weeks   Status On-going     SLP LONG TERM GOAL #4   Title Pt will demonstrate appearance of timely swallow/ functional oral manipulation of ice chips in 4/5 trials with usual mod A.    Time 7   Period Weeks   Status On-going  Plan - 11/22/16 1550    Clinical Impression Statement Patient presents with ongoing deficits in speech intelligibility (dysarthria, reduced breath support), cognitive communication and swallowing. Pt sustaining alertness/attention for periods of 1-2 minutes, requiring frequent verbal, tactile cues. In automatic speech and single word imitation tasks today, pt initially required max A, faded cues to usual min A. Sustained /a/ averaged 3.5 seconds. Recommend skilled ST to address pt's decline in communication and swallowing  in order to maximize functional communication, secretion management/ swallowing safety, reduce caregiver burden and improve quality of life.   Speech Therapy Frequency 2x / week   Treatment/Interventions Aspiration precaution training;Trials of upgraded texture/liquids;Compensatory strategies;Functional tasks;Patient/family education;Cueing hierarchy;Diet toleration management by SLP;Cognitive reorganization;Compensatory techniques;SLP instruction and feedback;Internal/external aids;Multimodal communcation approach;Pharyngeal strengthening exercises   Potential to Achieve Goals Fair   Potential Considerations Ability to learn/carryover information;Co-morbidities;Severity of impairments;Cooperation/participation level;Previous level of function;Pain level   SLP Home Exercise Plan sustained /a/, vowel phonation   Consulted and Agree with Plan of Care Patient;Family member/caregiver   Family Member Consulted caregiver leesa, husband chip      Patient will benefit from skilled therapeutic intervention in order  to improve the following deficits and impairments:   Dysarthria and anarthria    Problem List Patient Active Problem List   Diagnosis Date Noted  . Pseudomonas urinary tract infection 10/27/2016  . Severe muscle deconditioning 09/30/2016  . Pressure injury of skin 09/28/2016  . Respiratory failure ([place])   . Tracheobronchitis   . Urinary retention   . PEG (percutaneous endoscopic gastrostomy) status ([place])   . Benign essential HTN   . Hyponatremia   . Hypokalemia   . Reactive hypertension   . Respiratory insufficiency 09/24/2016  . Malignant neoplasm of upper-inner quadrant of left breast in female, estrogen receptor positive ([place]) 07/14/2016  . Heterotopic ossification of bone 03/10/2016  . Acute on chronic respiratory failure with hypoxia ([facility])   . Acute hypoxemic respiratory failure ([place])   . Acute blood loss anemia   . Atelectasis 02/04/2016  . Debility 02/03/2016  . Pneumonia of both lower lobes due to Pseudomonas species ([place])   . Pneumonia of both lower lobes due to methicillin susceptible Staphylococcus aureus (MSSA) ([place])   . Acute respiratory failure with hypoxia ([facility])   . Acute encephalopathy   . Seizures ([place])   . Sepsis ([place])   . Hypoxia   . Pain   . HCAP (healthcare-associated pneumonia) 12/28/2015  . Chronic respiratory failure ([place]) 12/28/2015  . Acute tracheobronchitis 12/24/2015  . Tracheostomy tube present ([place])   . Tracheal stenosis   . Chronic respiratory failure with hypoxia ([place])   . Spastic tetraplegia ([place]) 10/20/2015  . Tracheostomy status ([place]) 09/26/2015  . Allergic rhinitis 09/26/2015  . 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 Movement disorder 09/22/2015  . Encephalitis 09/22/2015  . Chest pain 02/07/2014  . Premature atrial contractions 01/13/2014  . Abnormality of gait 12/04/2013  . Hyperlipidemia 12/21/2011  . Cardiovascular risk factor 12/21/2011  . Bunion 01/31/2008  . Metatarsalgia of both feet 09/20/2007  . FLAT FOOT  09/20/2007  . HALLUX RIGIDUS, ACQUIRED 09/20/2007   Deneise Lever, [place], [place] Speech-Language Pathologist  Aliene Altes 11/22/2016, 3:53 PM  [facility] [address] [place], [place], [address] Phone: [phone]   Fax:  [phone]   Name: Latrisha Coiro MRN: [id number] Date of Birth: 11-02-1950

## 2016-11-23 ENCOUNTER — Ambulatory Visit: Payer: Medicare Other | Admitting: Physical Therapy

## 2016-11-23 ENCOUNTER — Encounter: Payer: Medicare Other | Admitting: Occupational Therapy

## 2016-11-23 NOTE — Therapy (Signed)
[facility] [address] [place], [place], [address] Phone: [phone]   Fax:  [phone]  Physical Therapy Treatment  Patient Details  Name: Judy Spears MRN: [id number] Date of Birth: 04-19-1950 Referring Provider: Alger Simons, MD  Encounter Date: 11/22/2016      PT End of Session - 11/22/16 1700    Visit Number 7   Number of Visits 20   Date for PT Re-Evaluation 12/27/16   Authorization Type [employer] MCR   PT Start Time 4128   PT Stop Time 1613   PT Time Calculation (min) 42 min   Activity Tolerance Patient tolerated treatment well   Behavior During Therapy [facility] for tasks assessed/performed      Past Medical History:  Diagnosis Date  . Arthritis    lt great toe  . Complication of anesthesia    pt has had headaches post op-did advise to hydra well preop  . Hair loss    right-sided  . History of exercise stress test    a. ETT (12/15) with 12:00 exercise, no ST changes, occasional PACs.   . Hyperlipidemia   . Insomnia   . Migraine headache   . Movement disorder    resltess in left legs  . Postmenopausal   . 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 Premature atrial contractions    a. Holter (12/15) with 8% PACs, no atrial runs or atrial fibrillation.     Past Surgical History:  Procedure Laterality Date  . BREAST BIOPSY  01/01/2011   Procedure: BREAST BIOPSY WITH NEEDLE LOCALIZATION;  Surgeon: Rolm Bookbinder, MD;  Location: [facility];  Service: General;  Laterality: Left;  left breast wire localization biopsy  . BREAST BIOPSY Right 07/27/2012  . BREAST BIOPSY Right 07/07/2016  . BREAST BIOPSY Left 06/30/2016  . cataracts right eye    . CESAREAN SECTION  1986  . HERNIA REPAIR  2000   RIH  . IR [place] TUBE PERCUT W/FLUORO  09/29/2016  . opirectinal membrane peel    . PEG PLACEMENT    . RHINOPLASTY  1983  . TRACHEOSTOMY      There were no vitals filed for this  visit.      Subjective Assessment - 11/22/16 1700    Subjective No new complaints. Pt denied any pain (nodded head no).    Patient is accompained by: Family member  Judy Spears-spouse, [name]- caregiver   Pertinent History Precautions:  PEG.  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  Another round of botox in LLE on 02/06/16   Limitations House hold activities;Walking;Standing;Sitting;Lifting;Writing   Patient Stated Goals Per husband, "For Judy Spears to be as independent as possible."   Currently in Pain? No/denies   Pain Score 0-No pain             [facility] Adult PT Treatment/Exercise - 11/22/16 1700      Transfers   Transfers Stand to Sit   Sit to Stand 3: Mod assist;With upper extremity assist;From chair/3-in-1;From bed   Sit to Stand Details Manual facilitation for weight shifting;Verbal cues for sequencing;Verbal cues for technique;Manual facilitation for weight bearing;Tactile cues for sequencing;Tactile cues for posture   Sit to Stand Details (indicate cue type and reason) cues/facilitation for posture and weight shifting. all stands performed with pt's left arm around PTA shoulder and HHA on other  side by SPTA. once pt had achieved good anterior weight shifitng cued pt to push through legs to power up with less assistance needed with each rep.   Stand to Sit 3: Mod assist;4: Min assist;With upper extremity assist;To bed;To chair/3-in-1   Stand to Sit Details (indicate cue type and reason) Tactile cues for initiation;Tactile cues for weight shifting;Verbal cues for sequencing;Verbal cues for technique;Manual facilitation for weight shifting   Stand to Sit Details cues/facilitation for hip/knee flexion to sit down, assistance needed with right knee due to tone. pt does attempt to reach back with right UE as well  with each sit down.    Stand Pivot Transfers 2: Max assist;3: Mod assist  of 2 people   Stand Pivot Transfer Details (indicate cue type and reason) mat to wheelchair with 3 muskateer support on left side and HHA on right side. pt taking 4-5 steps with mod/max assist for balance, assitance to advance right foot and assistance for left step placement/step control.    Comments sit<>stands performed 5-6 reps with decreased assistance each time (max downgrading to min x 1 rep). in standing worked on hip extension, left knee extension and trunk extension/left shoulder back with 2 person assist to facilitate all the above. once posture was as good as it was able to be obtained worked on lateral weight shifitng, progressing to left LE weight bearing with right foot stepping fwd/bwd x 2 reps with 2-3 of the stands. pt able to take short/small steps with assistance at right leg and facilitaiton to shift onto left leg. then also worked on right weight shifting with left stepping with pt able to self advance left foot each time, 2 step with 2-3 of the stands. all with mod/max assist for balance of 1 person with both persons assisting to maintain posture.     Neuro Re-ed    Neuro Re-ed Details  seated edge of mat: pt noted to have significant lean toward left side. worked on right elbow taps down to mat with elbow extension to right herself into sitting. min/mod assist of 1 person for balance and faciliation of motions, performed x 5 reps. then had pt prop onto right elbow with gentle overpressure applied to left hip in posterior direction and at flank just under pt's left arm in anterior/posterior directions to apply a gentle upper trunk stretch for postural correction x 3-4 reps. once pt returned to upright posture pt demo'd improved midline positioning with close supervision for static sitting balance. In midline that was achieved-  worked on anterior weight shifting via "push me away", then "pull me to you" cues x 5  reps prior to standing with emphasis on maintaining "tall posture", assistance needed to maintain balance and midline with via facilitation to trunk (2 person's used for facilitation with edge of mat activities)                                    PT Short Term Goals - 10/28/16 1950      PT SHORT TERM GOAL #1   Title Perform sit to/from stand from wheelchair or mat with +1 mod assist.   Baseline max to total assist on 10-28-16   Time 4   Period Weeks   Status New   Target Date 11/27/16     PT SHORT TERM GOAL #2   Title Pt will perform sit to stand from wheelchair to RW with mod assist.  Baseline max to total assist - 10-28-16   Time 4   Period Weeks   Status New   Target Date 11/27/16     PT SHORT TERM GOAL #3   Title Pt will improve standing balance/trunk control so that caregiver [name]) is able to transfer pt using St[place] without requiring a 2nd person for assist.     Baseline 2 people required with total assist - Charlaine Dalton is unable to be used at this time - 10-28-16   Time 4   Period Weeks   Status New   Target Date 11/27/16     PT SHORT TERM GOAL #4   Title Pt will sit with RUE support with mod assist unsupported on side of mat for at least 5" to demo improved trunk strength and core stabilization.   Baseline max assist needed on 10-28-16   Time 4   Period Weeks   Status New     PT SHORT TERM GOAL #5   Title Basic transfers from wheelchair to bed or commode with +1 mod assist.     Baseline max to total assist  - 10-28-16           PT Long Term Goals - 10/28/16 2004      PT LONG TERM GOAL #1   Title Pt will perform supine rolling R and L with min assist  to indicate improved independence with bed mobility.     Baseline dependent for all bed mobility per spouse's report - 10-28-16   Time 8   Period Weeks   Status New   Target Date 12/27/16     PT LONG TERM GOAL #2   Title Pt will sit on side of mat with RUE support for at least 5" with min assist.   Baseline  max assist on 10-28-16   Time 8   Period Weeks   Status New   Target Date 12/27/16     PT LONG TERM GOAL #3   Title Pt will transfer Lt/Rt sidelying to sitting with min assist for incr. independence with getting out of bed.     Baseline max to total assist needed - 10-28-16   Time 8   Period Weeks   Status New   Target Date 12/27/16     PT LONG TERM GOAL #4   Title Pt will perform basic transfers with min to mod assist to indicate improved functional independence.     Baseline max assist - 08-26-16   Time 8   Period Weeks   Status New   Target Date 12/27/16     PT LONG TERM GOAL #5   Title Pt will perform static standing with bil. UE support with mod assist.     Baseline pt able to stand in standing frame with mod assist to minimize deviations of shoulders leaning to left side with pelvis shifted to right side- 10-28-16   Time 8   Period Weeks   Status New   Target Date 12/27/16     PT LONG TERM GOAL #7   Title Perform sit to stand and stand to sit with mod assist.     Baseline max to total assist needed on 10-28-16   Time 8   Period Weeks   Status New               Plan - 11/22/16 1700    Clinical Impression Statement Today's skilled session continued to address transfers, posture and stretching with no complaints by pt. Pt  mostly awake with eyes open for ~80% of session. Did have eyes closed for some edge of mat activities (NMR and stretching), however once pt was engaged in activity such as standing her eyes opended. Pt was able to progress toward decreased assistance with overall tranfers as session progressed and to supervision for static sitting balance as session progressed. Pt is progressing toward goals and should benefit from continued PT to progress toward unmet goals.    Rehab Potential Fair   Clinical Impairments Affecting Rehab Potential Chronicity of condition, continued medical management, but has very strong family and financial support   PT Frequency 3x /  week   PT Duration 8 weeks   PT Treatment/Interventions ADLs/Self Care Home Management;DME Instruction;Gait training;Stair training;Functional mobility training;Therapeutic activities;Patient/family education;Cognitive remediation;Neuromuscular re-education;Therapeutic exercise;Balance training;Orthotic Fit/Training;Wheelchair mobility training;Manual techniques;Splinting;Visual/perceptual remediation/compensation;Vestibular;Passive range of motion;Electrical Stimulation   PT Next Visit Plan cont to work on sitting balance, LE ROM as tolerated; partial squats as tolerated for LE weight-bearing, standing activities, including pregait as able   PT Home Exercise Plan family/caregiver has ROM exercises from previous admissions   Consulted and Agree with Plan of Care Patient;Family member/caregiver   Family Member Consulted caregiver [name]      Patient will benefit from skilled therapeutic intervention in order to improve the following deficits and impairments:  Abnormal gait, Decreased balance, Decreased endurance, Decreased cognition, Cardiopulmonary status limiting activity, Decreased activity tolerance, Decreased coordination, Decreased strength, Impaired flexibility, Impaired tone, Decreased mobility, Increased muscle spasms, Impaired vision/preception, Decreased range of motion, Impaired UE functional use, Postural dysfunction, Improper body mechanics, Impaired perceived functional ability, Decreased knowledge of precautions, Decreased knowledge of use of DME  Visit Diagnosis: Abnormal posture  Muscle weakness (generalized)  Other abnormalities of gait and mobility  Other symptoms and signs involving the nervous system  Other abnormal involuntary movements     Problem List Patient Active Problem List   Diagnosis Date Noted  . Pseudomonas urinary tract infection 10/27/2016  . Severe muscle deconditioning 09/30/2016  . Pressure injury of skin 09/28/2016  . Respiratory failure ([place])   .  Tracheobronchitis   . Urinary retention   . PEG (percutaneous endoscopic gastrostomy) status ([place])   . Benign essential HTN   . Hyponatremia   . Hypokalemia   . Reactive hypertension   . Respiratory insufficiency 09/24/2016  . Malignant neoplasm of upper-inner quadrant of left breast in female, estrogen receptor positive ([place]) 07/14/2016  . Heterotopic ossification of bone 03/10/2016  . Acute on chronic respiratory failure with hypoxia ([place])   . Acute hypoxemic respiratory failure ([place])   . Acute blood loss anemia   . Atelectasis 02/04/2016  . Debility 02/03/2016  . Pneumonia of both lower lobes due to Pseudomonas species ([place])   . Pneumonia of both lower lobes due to methicillin susceptible Staphylococcus aureus (MSSA) ([place])   . Acute respiratory failure with hypoxia ([place])   . Acute encephalopathy   . Seizures ([place])   . Sepsis ([place])   . Hypoxia   . Pain   . HCAP (healthcare-associated pneumonia) 12/28/2015  . Chronic respiratory failure ([place]) 12/28/2015  . Acute tracheobronchitis 12/24/2015  . Tracheostomy tube present ([place])   . Tracheal stenosis   . Chronic respiratory failure with hypoxia ([place])   . Spastic tetraplegia ([place]) 10/20/2015  . Tracheostomy status ([place]) 09/26/2015  . Allergic rhinitis 09/26/2015  . 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 Movement disorder 09/22/2015  . Encephalitis 09/22/2015  . Chest pain 02/07/2014  . Premature atrial contractions 01/13/2014  .  Abnormality of gait 12/04/2013  . Hyperlipidemia 12/21/2011  . Cardiovascular risk factor 12/21/2011  . Bunion 01/31/2008  . Metatarsalgia of both feet 09/20/2007  . FLAT FOOT 09/20/2007  . HALLUX RIGIDUS, ACQUIRED 09/20/2007    Willow Ora, PTA, [facility] [address], [place], [place] [address] [phone] 11/23/16, 9:07 AM   Name: Judy Spears MRN: [id number] Date of Birth: 11/26/1950

## 2016-11-24 ENCOUNTER — Ambulatory Visit: Payer: Medicare Other | Admitting: Physical Therapy

## 2016-11-24 ENCOUNTER — Ambulatory Visit: Payer: Medicare Other

## 2016-11-24 ENCOUNTER — Encounter: Payer: Self-pay | Admitting: Occupational Therapy

## 2016-11-24 ENCOUNTER — Ambulatory Visit: Payer: Medicare Other | Admitting: Occupational Therapy

## 2016-11-24 ENCOUNTER — Encounter: Payer: Self-pay | Admitting: Pulmonary Disease

## 2016-11-24 ENCOUNTER — Encounter: Payer: Self-pay | Admitting: Physical Therapy

## 2016-11-24 ENCOUNTER — Ambulatory Visit (INDEPENDENT_AMBULATORY_CARE_PROVIDER_SITE_OTHER): Payer: Medicare Other | Admitting: Pulmonary Disease

## 2016-11-24 VITALS — BP 130/66 | HR 83

## 2016-11-24 DIAGNOSIS — R293 Abnormal posture: Secondary | ICD-10-CM

## 2016-11-24 DIAGNOSIS — M25611 Stiffness of right shoulder, not elsewhere classified: Secondary | ICD-10-CM

## 2016-11-24 DIAGNOSIS — R41844 Frontal lobe and executive function deficit: Secondary | ICD-10-CM

## 2016-11-24 DIAGNOSIS — Z23 Encounter for immunization: Secondary | ICD-10-CM | POA: Diagnosis not present

## 2016-11-24 DIAGNOSIS — R2689 Other abnormalities of gait and mobility: Secondary | ICD-10-CM

## 2016-11-24 DIAGNOSIS — R29818 Other symptoms and signs involving the nervous system: Secondary | ICD-10-CM

## 2016-11-24 DIAGNOSIS — R05 Cough: Secondary | ICD-10-CM

## 2016-11-24 DIAGNOSIS — M25622 Stiffness of left elbow, not elsewhere classified: Secondary | ICD-10-CM

## 2016-11-24 DIAGNOSIS — Z930 Tracheostomy status: Secondary | ICD-10-CM | POA: Diagnosis not present

## 2016-11-24 DIAGNOSIS — M6281 Muscle weakness (generalized): Secondary | ICD-10-CM

## 2016-11-24 DIAGNOSIS — R1312 Dysphagia, oropharyngeal phase: Secondary | ICD-10-CM

## 2016-11-24 DIAGNOSIS — R059 Cough, unspecified: Secondary | ICD-10-CM

## 2016-11-24 DIAGNOSIS — J9611 Chronic respiratory failure with hypoxia: Secondary | ICD-10-CM

## 2016-11-24 DIAGNOSIS — M25612 Stiffness of left shoulder, not elsewhere classified: Secondary | ICD-10-CM

## 2016-11-24 DIAGNOSIS — R41841 Cognitive communication deficit: Secondary | ICD-10-CM

## 2016-11-24 DIAGNOSIS — R471 Dysarthria and anarthria: Secondary | ICD-10-CM

## 2016-11-24 DIAGNOSIS — R258 Other abnormal involuntary movements: Secondary | ICD-10-CM

## 2016-11-24 MED ORDER — SODIUM CHLORIDE 3 % IN NEBU: INHALATION_SOLUTION | Freq: Two times a day (BID) | RESPIRATORY_TRACT | 11 refills | Status: DC | PRN

## 2016-11-24 MED ORDER — ALBUTEROL SULFATE (2.5 MG/3ML) 0.083% IN NEBU: 2.5000 mg | INHALATION_SOLUTION | Freq: Four times a day (QID) | RESPIRATORY_TRACT | 11 refills | Status: DC | PRN

## 2016-11-24 NOTE — Patient Instructions (Signed)
Recurrent episodes of bronchitis with mucus production: Keep taking Mucinex 4 times a day as you are doing (guaifenesin) Use the vest once daily If you notice that she is having trouble with producing mucus then use the hypertonic saline as needed to help her cough mucus out, do not use this more than twice a day If you use the hypertonic saline you need to also use albuterol If she has more trouble with breathing or mucus production call me right away so that we can give her an antibiotic We will see you back in 6-8 weeks or sooner if needed

## 2016-11-24 NOTE — Therapy (Signed)
[facility] [address] [place], [place], [address] Phone: [phone]   Fax:  [phone]  Physical Therapy Treatment  Patient Details  Name: Judy Spears MRN: [id number] Date of Birth: 1951-01-12 Referring Provider: Alger Simons, MD  Encounter Date: 11/24/2016      PT End of Session - 11/24/16 2236    Visit Number 8   Number of Visits 20   Date for PT Re-Evaluation 12/27/16   Authorization Type [employer] MCR   PT Start Time 6010   PT Stop Time 1658   PT Time Calculation (min) 43 min   Equipment Utilized During Treatment Gait belt   Activity Tolerance Patient tolerated treatment well   Behavior During Therapy [facility] for tasks assessed/performed      Past Medical History:  Diagnosis Date  . Arthritis    lt great toe  . Complication of anesthesia    pt has had headaches post op-did advise to hydra well preop  . Hair loss    right-sided  . History of exercise stress test    a. ETT (12/15) with 12:00 exercise, no ST changes, occasional PACs.   . Hyperlipidemia   . Insomnia   . Migraine headache   . Movement disorder    resltess in left legs  . Postmenopausal   . 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 Premature atrial contractions    a. Holter (12/15) with 8% PACs, no atrial runs or atrial fibrillation.     Past Surgical History:  Procedure Laterality Date  . BREAST BIOPSY  01/01/2011   Procedure: BREAST BIOPSY WITH NEEDLE LOCALIZATION;  Surgeon: Rolm Bookbinder, MD;  Location: [facility];  Service: General;  Laterality: Left;  left breast wire localization biopsy  . BREAST BIOPSY Right 07/27/2012  . BREAST BIOPSY Right 07/07/2016  . BREAST BIOPSY Left 06/30/2016  . cataracts right eye    . CESAREAN SECTION  1986  . HERNIA REPAIR  2000   RIH  . IR [place] TUBE PERCUT W/FLUORO  09/29/2016  . opirectinal membrane peel    . PEG PLACEMENT    . RHINOPLASTY  1983  . TRACHEOSTOMY       There were no vitals filed for this visit.      Subjective Assessment - 11/24/16 2220    Subjective Pt reporting pain in right hip this session. Otherwise no new complaints.   Patient is accompained by: Family member  spouse Judy Spears, caregiver Judy Spears   Pertinent History Precautions:  PEG.  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  Another round of botox in LLE on 02/06/16   Limitations House hold activities;Walking;Standing;Sitting;Lifting;Writing   Patient Stated Goals Per husband, "For [name] to be as independent as possible."   Pain Score --  pt unable to rate   Pain Location Hip   Pain Orientation Right   Pain Descriptors / Indicators Grimacing  pt unable to describe pain   Pain Type Chronic pain   Pain Radiating Towards      Pain Onset In the past 7 days   Aggravating Factors  pain present on arrival and pt continued to be agreeable to PT today   Pain Relieving Factors limited weight bearing and right LE movement             [facility] Adult PT Treatment/Exercise - 11/24/16  2303      Bed Mobility   Bed Mobility Supine to Sit;Sit to Supine   Supine to Sit 2: Max assist;HOB flat   Supine to Sit: Patient Percentage 30%   Supine to Sit Details (indicate cue type and reason) cues to use core muscles to assist with sitting up, pt able to assit with bringing LE's to and off edge of mat table.    Sit to Supine 2: Max assist   Sit to Supine - Details (indicate cue type and reason) assistance needed for controlled lowering of trunk and to fully lift LE's onto mat surface.    Scooting to [facility] Details (indicate cue type and reason) with pt lying down on inclined bolster with pillows: manual stretching for upper trunk scapular retraction/depression, upper trunk positioning in neutral with lower trunk  rotation both ways, then keeping upper trunk in neutral position worked on: left knee PNF patterns, flowing into flexion/extension, left hip IR/ER with hip and knee in 90 degrees flexion, and with left leg stable worked on right hip IR/ER and hip/knee flexion/exension. minimal reports of hip pain with exercises performed.                                    Transfers   Transfers Sit to Stand;Stand to Sit   Sit to Stand 3: Mod assist;With upper extremity assist;From chair/3-in-1;From bed   Sit to Stand Details Manual facilitation for weight shifting;Verbal cues for sequencing;Verbal cues for technique;Manual facilitation for weight bearing;Tactile cues for sequencing;Tactile cues for posture   Sit to Stand Details (indicate cue type and reason) cues and facilitation needed for weight shifting, trunk extension and hip/knee extension with standing up                                 Stand to Sit 3: Mod assist;4: Min assist;With upper extremity assist;To bed;To chair/3-in-1   Stand to Sit Details (indicate cue type and reason) Tactile cues for initiation;Tactile cues for weight shifting;Verbal cues for sequencing;Verbal cues for technique;Manual facilitation for weight shifting   Stand to Sit Details cues/facilitation for hip/knee flexion for sitting down wtih assistance to control descent with sitting downl   Stand Pivot Transfers 2: Max assist   Stand Pivot Transfer Details (indicate cue type and reason) mat to/from wheelchair with max assist for balance. increased assistance needed with transfer toward mat. with transfer from mat to wheelchair pt was able to take 4-5 steps with assist for weight shifting, left step placement and right leg advancement/step placement.                                       Comments sit<>stands performed x4 reps with decreased assistance each time (max downgrading to min x 1 rep). in standing worked on hip extension, left knee extension and trunk extension/left shoulder back  with 2 person assist to facilitate all the above. once posture was as good as it was able to be obtained worked on lateral weight shifitng, progressing to left LE weight bearing with right foot stepping fwd/bwd x 2 reps with 1 of the stands. pt able to take short/small steps with assistance at right leg and facilitaiton to shift onto left leg. then also worked on right weight shifting with left  stepping with pt able to self advance left foot each time, 2 step with 1-2 of the stands. all with mod/max assist for balance of 1 person with both persons assisting to maintain posture.     Neuro Re-ed    Neuro Re-ed Details  seated edge of mat: pt noted to have significant lean toward left side. worked on right elbow taps down to mat with elbow extension to right herself into sitting. min/mod assist of 1 person for balance and faciliation of motions, performed x 5 reps. then had pt prop onto right elbow with gentle overpressure applied to left hip in posterior direction and at flank just under pt's left arm in anterior/posterior directions to apply a gentle upper trunk stretch for postural correction x2 reps. once pt returned to upright posture pt demo'd improved midline positioning with close supervision for static sitting balance. In midline that was achieved-  worked on anterior weight shifting via "push me away", then "pull me to you" cues x 5 reps prior to standing with emphasis on maintaining "tall posture", assistance needed to maintain balance and midline with via facilitation to trunk (2 person's used for facilitation with edge of mat activities)                                    PT Short Term Goals - 10/28/16 1950      PT SHORT TERM GOAL #1   Title Perform sit to/from stand from wheelchair or mat with +1 mod assist.   Baseline max to total assist on 10-28-16   Time 4   Period Weeks   Status New   Target Date 11/27/16     PT SHORT TERM GOAL #2   Title Pt will perform sit to stand from  wheelchair to RW with mod assist.   Baseline max to total assist - 10-28-16   Time 4   Period Weeks   Status New   Target Date 11/27/16     PT SHORT TERM GOAL #3   Title Pt will improve standing balance/trunk control so that caregiver Judy Spears) is able to transfer pt using [place] without requiring a 2nd person for assist.     Baseline 2 people required with total assist - Judy Spears is unable to be used at this time - 10-28-16   Time 4   Period Weeks   Status New   Target Date 11/27/16     PT SHORT TERM GOAL #4   Title Pt will sit with RUE support with mod assist unsupported on side of mat for at least 5" to demo improved trunk strength and core stabilization.   Baseline max assist needed on 10-28-16   Time 4   Period Weeks   Status New     PT SHORT TERM GOAL #5   Title Basic transfers from wheelchair to bed or commode with +1 mod assist.     Baseline max to total assist  - 10-28-16           PT Long Term Goals - 10/28/16 2004      PT LONG TERM GOAL #1   Title Pt will perform supine rolling R and L with min assist  to indicate improved independence with bed mobility.     Baseline dependent for all bed mobility per spouse's report - 10-28-16   Time 8   Period Weeks   Status New   Target Date 12/27/16  PT LONG TERM GOAL #2   Title Pt will sit on side of mat with RUE support for at least 5" with min assist.   Baseline max assist on 10-28-16   Time 8   Period Weeks   Status New   Target Date 12/27/16     PT LONG TERM GOAL #3   Title Pt will transfer Lt/Rt sidelying to sitting with min assist for incr. independence with getting out of bed.     Baseline max to total assist needed - 10-28-16   Time 8   Period Weeks   Status New   Target Date 12/27/16     PT LONG TERM GOAL #4   Title Pt will perform basic transfers with min to mod assist to indicate improved functional independence.     Baseline max assist - 08-26-16   Time 8   Period Weeks   Status New   Target Date 12/27/16      PT LONG TERM GOAL #5   Title Pt will perform static standing with bil. UE support with mod assist.     Baseline pt able to stand in standing frame with mod assist to minimize deviations of shoulders leaning to left side with pelvis shifted to right side- 10-28-16   Time 8   Period Weeks   Status New   Target Date 12/27/16     PT LONG TERM GOAL #7   Title Perform sit to stand and stand to sit with mod assist.     Baseline max to total assist needed on 10-28-16   Time 8   Period Weeks   Status New               Plan - 11/24/16 2244    Clinical Impression Statement Today's skilled session continued to address upper body stretching/postural correction, transfers, standing balance and pregait activities. Pt more sleepy with today's session than with previous sessions and needed more cues to open eyes and stay on task. Pt is slowly progressing toward goals and should benefit from continued PT to progress toward unmet goals.                                          Rehab Potential Fair   Clinical Impairments Affecting Rehab Potential Chronicity of condition, continued medical management, but has very strong family and financial support   PT Frequency 3x / week   PT Duration 8 weeks   PT Treatment/Interventions ADLs/Self Care Home Management;DME Instruction;Gait training;Stair training;Functional mobility training;Therapeutic activities;Patient/family education;Cognitive remediation;Neuromuscular re-education;Therapeutic exercise;Balance training;Orthotic Fit/Training;Wheelchair mobility training;Manual techniques;Splinting;Visual/perceptual remediation/compensation;Vestibular;Passive range of motion;Electrical Stimulation   PT Next Visit Plan cont to work on sitting balance, LE ROM as tolerated; partial squats as tolerated for LE weight-bearing, standing activities, including pregait as able   PT Home Exercise Plan family/caregiver has ROM exercises from previous admissions   Consulted  and Agree with Plan of Care Patient;Family member/caregiver   Family Member Consulted caregiver [name]      Patient will benefit from skilled therapeutic intervention in order to improve the following deficits and impairments:  Abnormal gait, Decreased balance, Decreased endurance, Decreased cognition, Cardiopulmonary status limiting activity, Decreased activity tolerance, Decreased coordination, Decreased strength, Impaired flexibility, Impaired tone, Decreased mobility, Increased muscle spasms, Impaired vision/preception, Decreased range of motion, Impaired UE functional use, Postural dysfunction, Improper body mechanics, Impaired perceived functional ability, Decreased knowledge of precautions, Decreased  knowledge of use of DME  Visit Diagnosis: Abnormal posture  Muscle weakness (generalized)  Other symptoms and signs involving the nervous system  Other abnormalities of gait and mobility  Other abnormal involuntary movements     Problem List Patient Active Problem List   Diagnosis Date Noted  . Pseudomonas urinary tract infection 10/27/2016  . Severe muscle deconditioning 09/30/2016  . Pressure injury of skin 09/28/2016  . Respiratory failure ([place])   . Tracheobronchitis   . Urinary retention   . PEG (percutaneous endoscopic gastrostomy) status ([place])   . Benign essential HTN   . Hyponatremia   . Hypokalemia   . Reactive hypertension   . Respiratory insufficiency 09/24/2016  . Malignant neoplasm of upper-inner quadrant of left breast in female, estrogen receptor positive ([place]) 07/14/2016  . Heterotopic ossification of bone 03/10/2016  . Acute on chronic respiratory failure with hypoxia ([place])   . Acute hypoxemic respiratory failure ([place])   . Acute blood loss anemia   . Atelectasis 02/04/2016  . Debility 02/03/2016  . Pneumonia of both lower lobes due to Pseudomonas species ([place])   . Pneumonia of both lower lobes due to methicillin susceptible Staphylococcus aureus (MSSA)  ([place])   . Acute respiratory failure with hypoxia ([place])   . Acute encephalopathy   . Seizures ([place])   . Sepsis ([place])   . Hypoxia   . Pain   . HCAP (healthcare-associated pneumonia) 12/28/2015  . Chronic respiratory failure ([place]) 12/28/2015  . Acute tracheobronchitis 12/24/2015  . Tracheostomy tube present ([place])   . Tracheal stenosis   . Chronic respiratory failure with hypoxia ([place])   . Spastic tetraplegia ([employer]) 10/20/2015  . Tracheostomy status ([place]) 09/26/2015  . Allergic rhinitis 09/26/2015  . 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 Movement disorder 09/22/2015  . Encephalitis 09/22/2015  . Chest pain 02/07/2014  . Premature atrial contractions 01/13/2014  . Abnormality of gait 12/04/2013  . Hyperlipidemia 12/21/2011  . Cardiovascular risk factor 12/21/2011  . Bunion 01/31/2008  . Metatarsalgia of both feet 09/20/2007  . FLAT FOOT 09/20/2007  . HALLUX RIGIDUS, ACQUIRED 09/20/2007    Willow Ora, PTA, [facility] [address], [place], [place] [address] [phone] 11/24/16, 11:52 PM   Name: Judy Spears MRN: [id number] Date of Birth: 10/06/1950

## 2016-11-24 NOTE — Therapy (Signed)
[facility] [address] [place], [place], [address] Phone: [phone]   Fax:  [phone]  Occupational Therapy Treatment  Patient Details  Name: Judy Spears MRN: [id number] Date of Birth: 1950-01-29 Referring Provider: Dr. [name]  Encounter Date: 11/24/2016      OT End of Session - 11/24/16 1525    Visit Number 6   Number of Visits 16   Date for OT Re-Evaluation 12/27/16   Authorization Type medicare will need PN and G code every 10th visit   Authorization Time Period 60 days   Authorization - Visit Number 6   Authorization - Number of Visits 10   OT Start Time --  treated pt from 1232-1315 and then again from 1405 to 1440      Past Medical History:  Diagnosis Date  . Arthritis    lt great toe  . Complication of anesthesia    pt has had headaches post op-did advise to hydra well preop  . Hair loss    right-sided  . History of exercise stress test    a. ETT (12/15) with 12:00 exercise, no ST changes, occasional PACs.   . Hyperlipidemia   . Insomnia   . Migraine headache   . Movement disorder    resltess in left legs  . Postmenopausal   . 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 Premature atrial contractions    a. Holter (12/15) with 8% PACs, no atrial runs or atrial fibrillation.     Past Surgical History:  Procedure Laterality Date  . BREAST BIOPSY  01/01/2011   Procedure: BREAST BIOPSY WITH NEEDLE LOCALIZATION;  Surgeon: Rolm Bookbinder, MD;  Location: [facility];  Service: General;  Laterality: Left;  left breast wire localization biopsy  . BREAST BIOPSY Right 07/27/2012  . BREAST BIOPSY Right 07/07/2016  . BREAST BIOPSY Left 06/30/2016  . cataracts right eye    . CESAREAN SECTION  1986  . HERNIA REPAIR  2000   RIH  . IR [place] TUBE PERCUT W/FLUORO  09/29/2016  . opirectinal membrane peel    . PEG PLACEMENT    . RHINOPLASTY  1983  . TRACHEOSTOMY      There  were no vitals filed for this visit.      Subjective Assessment - 11/24/16 1409    Subjective  Shakes head No when asked if she has any pain   Patient is accompained by: Family member  husband [name] and caregiver Leesa   Pertinent History see epic snapshot - ABI/encephalitis/hypoxia from virus; recent hospitalization due to MRSA PNA    Patient Stated Goals Pt unable to state.    Currently in Pain? No/denies                      OT Treatments/Exercises (OP) - 11/24/16 0001      ADLs   ADL Comments Deberah Pelton, ATP, with [employer] present for wheelchair seating modifications - opened up back angle, decreased seat depth, provided with chest strap. Assessed for wheelchair modifications secondary to postural changes due to change in medical/functional status.  LMN to be written after quote received from vendor- please refer to LMN for details.     Neurological Re-education Exercises   Other Exercises 1 Neuro re ed to address initiating activation BLE"s for  transfers.  Addressed active sitting balance and active transitional movements from sitting to sideleaning to sitting to sideleaning.  Pt able to transition with only intermittent min a  and max vcs. Pt able to maintain static sititng balance prep for approximately 30-45 seconds.       Manual Therapy   Manual Therapy Soft tissue mobilization   Manual therapy comments Soft tissue mobilization to address elongation and of l lateral side of trunk as well as pelvic mobility to address alignment for improved sitting balance.                    OT Short Term Goals - 11/24/16 1521      OT SHORT TERM GOAL #1   Title Pt, caregiver and husband will be mod I with HEP - 12/22/2016   Status On-going     OT SHORT TERM GOAL #2   Title Pt will consistently require max assist x1 from caregiver or husband for basic transfers (currently total A x 2 for untrained health professional)   Status On-going     OT SHORT TERM GOAL #3    Title Pt will sit on level surfaces in preparation for participation in ADL tasks with intermittent min a and mod cues after preparation.   Status Achieved     OT SHORT TERM GOAL #4   Title Pt will require no more than total assist x1 from family/caregiver to use steady assist for basic transfers (not toilet or shower transfers).     Status On-going     OT SHORT TERM GOAL #5   Title Pt will demonstrate ability to follow simple, functional commands in context at least 50% of the time.    Status On-going           OT Long Term Goals - 11/24/16 1523      OT LONG TERM GOAL #1   Title Pt , husband and caregiver will be mod I with upgraded HEP prn - 12/27/2016   Status On-going     OT LONG TERM GOAL #2   Title Pt will will demonstrate ability to sit on even surface with supervision while engaging in simple activity   Status On-going     OT LONG TERM GOAL #3   Title Pt will be mod a for squat pivot basic transfers   Status On-going     OT LONG TERM GOAL #4   Title Pt will be mod a x1 to use steady for toilet transfers   Status On-going     OT LONG TERM GOAL #5   Title Pt will be max a x1 for clothing mgmt using steady for toilet transfers   Status On-going     OT LONG TERM GOAL #6   Title Pt will demonstrate ability to don shirt over both arms with set up.    Status On-going     OT LONG TERM GOAL #7   Title Pt will demonstrate ability to sit with supevision in wheelchair shower seat during shower.    Status On-going               Plan - 11/24/16 1524    Clinical Impression Statement Pt continues with very slow progress. Pt with varying level of alertness from session to session.    Current Impairments/barriers affecting progress: severity of deficits, very slow rate of recovery; pt with recent significant decline over past several months due medical complications   OT Frequency 3x / week   OT Duration 8 weeks   OT Treatment/Interventions Self-care/ADL  training;Ultrasound;Moist Heat;Electrical Stimulation;DME and/or AE instruction;Neuromuscular education;Therapeutic exercise;Functional Mobility Training;Manual Therapy;Passive range of motion;Splinting;Therapeutic activities;Balance training;Patient/family education;Visual/perceptual remediation/compensation;Cognitive remediation/compensation  Plan NMR trunk, sitting balance, weight shifting initiated by pt, sit to stand, bed positioning, manual therapy to address alignment   Consulted and Agree with Plan of Care Patient;Family member/caregiver   Family Member Consulted caregiver [place], Husband [name]      Patient will benefit from skilled therapeutic intervention in order to improve the following deficits and impairments:  Decreased endurance, Decreased skin integrity, Impaired vision/preception, Improper body mechanics, Impaired perceived functional ability, Decreased knowledge of precautions, Cardiopulmonary status limiting activity, Decreased activity tolerance, Decreased knowledge of use of DME, Decreased strength, Impaired flexibility, Improper spinal/pelvic alignment, Impaired sensation, Difficulty walking, Decreased mobility, Decreased balance, Decreased cognition, Decreased range of motion, Impaired tone, Impaired UE functional use, Decreased safety awareness, Decreased coordination, Pain, Abnormal gait  Visit Diagnosis: Abnormal posture  Muscle weakness (generalized)  Other symptoms and signs involving the nervous system  Stiffness of left shoulder, not elsewhere classified  Stiffness of right shoulder, not elsewhere classified  Stiffness of left elbow, not elsewhere classified  Frontal lobe and executive function deficit    Problem List Patient Active Problem List   Diagnosis Date Noted  . Pseudomonas urinary tract infection 10/27/2016  . Severe muscle deconditioning 09/30/2016  . Pressure injury of skin 09/28/2016  . Respiratory failure ([place])   . Tracheobronchitis   .  Urinary retention   . PEG (percutaneous endoscopic gastrostomy) status ([place])   . Benign essential HTN   . Hyponatremia   . Hypokalemia   . Reactive hypertension   . Respiratory insufficiency 09/24/2016  . Malignant neoplasm of upper-inner quadrant of left breast in female, estrogen receptor positive ([place]) 07/14/2016  . Heterotopic ossification of bone 03/10/2016  . Acute on chronic respiratory failure with hypoxia ([place])   . Acute hypoxemic respiratory failure ([place])   . Acute blood loss anemia   . Atelectasis 02/04/2016  . Debility 02/03/2016  . Pneumonia of both lower lobes due to Pseudomonas species ([place])   . Pneumonia of both lower lobes due to methicillin susceptible Staphylococcus aureus (MSSA) ([place])   . Acute respiratory failure with hypoxia ([place])   . Acute encephalopathy   . Seizures ([place])   . Sepsis ([place])   . Hypoxia   . Pain   . HCAP (healthcare-associated pneumonia) 12/28/2015  . Chronic respiratory failure ([place]) 12/28/2015  . Acute tracheobronchitis 12/24/2015  . Tracheostomy tube present ([place])   . Tracheal stenosis   . Chronic respiratory failure with hypoxia ([place])   . Spastic tetraplegia ([place]) 10/20/2015  . Tracheostomy status ([place]) 09/26/2015  . Allergic rhinitis 09/26/2015  . 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 Movement disorder 09/22/2015  . Encephalitis 09/22/2015  . Chest pain 02/07/2014  . Premature atrial contractions 01/13/2014  . Abnormality of gait 12/04/2013  . Hyperlipidemia 12/21/2011  . Cardiovascular risk factor 12/21/2011  . Bunion 01/31/2008  . Metatarsalgia of both feet 09/20/2007  . FLAT FOOT 09/20/2007  . HALLUX RIGIDUS, ACQUIRED 09/20/2007    Quay Burow, OTR/L 11/24/2016, 3:30 PM  [facility] [address] [place], [place], [address] Phone: [phone]   Fax:  [phone]  Name: Tyne Banta MRN: [id number] Date of Birth: [birth year]

## 2016-11-24 NOTE — Therapy (Signed)
[facility] [address] [place], [place], [address] Phone: [phone]   Fax:  [phone]  Speech Language Pathology Treatment  Patient Details  Name: Judy Spears MRN: [id number] Date of Birth: 08-09-1950 Referring Provider: Dr. Quita Skye. [name]  Encounter Date: 11/24/2016      End of Session - 11/24/16 1545    Visit Number 5   Number of Visits 17   Date for SLP Re-Evaluation 01/05/17   SLP Start Time 0539   SLP Stop Time  1530   SLP Time Calculation (min) 43 min   Activity Tolerance Patient tolerated treatment well      Past Medical History:  Diagnosis Date  . Arthritis    lt great toe  . Complication of anesthesia    pt has had headaches post op-did advise to hydra well preop  . Hair loss    right-sided  . History of exercise stress test    a. ETT (12/15) with 12:00 exercise, no ST changes, occasional PACs.   . Hyperlipidemia   . Insomnia   . Migraine headache   . Movement disorder    resltess in left legs  . Postmenopausal   . 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 Premature atrial contractions    a. Holter (12/15) with 8% PACs, no atrial runs or atrial fibrillation.     Past Surgical History:  Procedure Laterality Date  . BREAST BIOPSY  01/01/2011   Procedure: BREAST BIOPSY WITH NEEDLE LOCALIZATION;  Surgeon: Rolm Bookbinder, MD;  Location: [facility];  Service: General;  Laterality: Left;  left breast wire localization biopsy  . BREAST BIOPSY Right 07/27/2012  . BREAST BIOPSY Right 07/07/2016  . BREAST BIOPSY Left 06/30/2016  . cataracts right eye    . CESAREAN SECTION  1986  . HERNIA REPAIR  2000   RIH  . IR [place] TUBE PERCUT W/FLUORO  09/29/2016  . opirectinal membrane peel    . PEG PLACEMENT    . RHINOPLASTY  1983  . TRACHEOSTOMY      There were no vitals filed for this visit.      Subjective Assessment - 11/24/16 1445    Subjective Arrives  alert from OT.   Patient is accompained by: Family member  [name]- husband, [name]- aide   Currently in Pain? No/denies               ADULT SLP TREATMENT - 11/24/16 1446      General Information   Behavior/Cognition Cooperative;Distractible;Requires cueing;Pleasant mood;Lethargic   Patient Positioning Partially reclined     Treatment Provided   Treatment provided Cognitive-Linquistic     Cognitive-Linquistic Treatment   Treatment focused on Voice;Dysarthria   Skilled Treatment Pt somnolent for 90% of session today. SLP engaged pt in tasks to improve alertness throughout session, including use of cold compress and chair repositioning x7 during the session to attempt to incr pt alertness. SLP prompted pt to imitate vowels /i/, /a/, /o/, /u/ - voicing 25% of the time. She answered "wh" questions for where cold felt 90% success, without voicing despite SLP max cues for voicing. Loud /a/ was modeled for pt continually for approx 3 minutes with tactile, verbal, visual (unsuccessful due to pt somnolent) cues. SLP took pt's arms and spread them to accentuate full breath but little/no success with voicing /a/ at this time.. Pt ocunted 1-10 x6 with voicing <5% of the time and correct but weak artic 100% correct. Pt answered personal "wh" questions with  60% success - suspect level of somnolence hindered correct responses.     Tracheostomy Shiley 4 mm Uncuffed   Tracheostomy Properties Placement Date: 11/11/16 Placement Time: 1510 Inserted prior to hospital arrival?: Yes Brand: Shiley Size (mm): 4 mm Style: Uncuffed   Status Capped;Secured   Site Assessment Clean;Dry   Ties Assessment Clean;Dry;Secure     Assessment / Recommendations / Plan   Plan Continue with current plan of care     Progression Toward Goals   Progression toward goals --  level of somnolence hindered overall progress today            SLP Short Term Goals - 11/24/16 1548      SLP SHORT TERM GOAL #1   Title Pt will  demonstrate inspiratory and blowing exercises to increase respiratory support and coordination for vocalizations with usual mod A   Time 3   Period Weeks   Status On-going     SLP SHORT TERM GOAL #2   Title Pt will achieve audible phonation with prolonged vowels and/or CV syllables in 75% of trials with usual mod A.    Time 3   Period Weeks   Status On-going     SLP SHORT TERM GOAL #3   Title Pt will coordinate respiration/phonation for imitation of single words in 75% of trials with usual mod A.    Baseline 11/22/16   Time 3   Period Weeks   Status On-going     SLP SHORT TERM GOAL #4   Title Pt will sustain attention to therapy tasks for 7 minutes with mod A multimodal cues.    Time 3   Period Weeks   Status On-going     SLP SHORT TERM GOAL #5   Title Pt will participate in thorough oral care regimen at least 4 times per day and prior to trials of ice chips per caregiver report over 3 sessions.   Baseline 10.22.18   Time 3   Period Weeks   Status On-going          SLP Long Term Goals - 11/24/16 1548      SLP LONG TERM GOAL #1   Title Pt will demonstrate cough/swallow for secretion management with min A from caregiver over 5 sessions.   Time 7   Period Weeks   Status On-going     SLP LONG TERM GOAL #2   Title Pt will demonstrate audible phonation/intelligible speech for 2 word phrases with usual min A in 7/10 trials.   Time 7   Period Weeks   Status On-going     SLP LONG TERM GOAL #3   Title Pt will communicate a basic want/ need by any functional means, including AAC, with mod A from caregivers over 3 sessions.   Time 7   Period Weeks   Status On-going     SLP LONG TERM GOAL #4   Title Pt will demonstrate appearance of timely swallow/ functional oral manipulation of ice chips in 4/5 trials with usual mod A.    Time 7   Period Weeks   Status On-going          Plan - 11/24/16 1545    Clinical Impression Statement Patient presents with ongoing deficits  in speech intelligibility (dysarthria, reduced breath support), cognitive communication and swallowing. Pt demo'd ability to sustain alertness/attention for periods of only 1-2 minutes, requiring frequent verbal, tactile and movement (of wheelchair chair) cues. Recommend cont'd skilled to address pt's decline in communication and swallowing  in order to maximize functional communication, secretion management/ swallowing safety, reduce caregiver burden and improve quality of life.   Speech Therapy Frequency 2x / week   Treatment/Interventions Aspiration precaution training;Trials of upgraded texture/liquids;Compensatory strategies;Functional tasks;Patient/family education;Cueing hierarchy;Diet toleration management by SLP;Cognitive reorganization;Compensatory techniques;SLP instruction and feedback;Internal/external aids;Multimodal communcation approach;Pharyngeal strengthening exercises   Potential to Achieve Goals Fair   Potential Considerations Ability to learn/carryover information;Co-morbidities;Severity of impairments;Cooperation/participation level;Previous level of function;Pain level   SLP Home Exercise Plan sustained /a/, vowel phonation   Consulted and Agree with Plan of Care Patient;Family member/caregiver   Family Member Consulted caregiver leesa, husband chip      Patient will benefit from skilled therapeutic intervention in order to improve the following deficits and impairments:   Dysarthria and anarthria  Cognitive communication deficit  Dysphagia, oropharyngeal phase    Problem List Patient Active Problem List   Diagnosis Date Noted  . Pseudomonas urinary tract infection 10/27/2016  . Severe muscle deconditioning 09/30/2016  . Pressure injury of skin 09/28/2016  . Respiratory failure ([place])   . Tracheobronchitis   . Urinary retention   . PEG (percutaneous endoscopic gastrostomy) status ([place])   . Benign essential HTN   . Hyponatremia   . Hypokalemia   . Reactive  hypertension   . Respiratory insufficiency 09/24/2016  . Malignant neoplasm of upper-inner quadrant of left breast in female, estrogen receptor positive ([place]) 07/14/2016  . Heterotopic ossification of bone 03/10/2016  . Acute on chronic respiratory failure with hypoxia ([place])   . Acute hypoxemic respiratory failure ([place])   . Acute blood loss anemia   . Atelectasis 02/04/2016  . Debility 02/03/2016  . Pneumonia of both lower lobes due to Pseudomonas species ([place])   . Pneumonia of both lower lobes due to methicillin susceptible Staphylococcus aureus (MSSA) ([place])   . Acute respiratory failure with hypoxia ([place])   . Acute encephalopathy   . Seizures ([place])   . Sepsis ([place])   . Hypoxia   . Pain   . HCAP (healthcare-associated pneumonia) 12/28/2015  . Chronic respiratory failure ([place]) 12/28/2015  . Acute tracheobronchitis 12/24/2015  . Tracheostomy tube present ([place])   . Tracheal stenosis   . Chronic respiratory failure with hypoxia ([place])   . 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 Tracheostomy status ([place]) 09/26/2015  . Allergic rhinitis 09/26/2015  . 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 Movement disorder 09/22/2015  . Encephalitis 09/22/2015  . Chest pain 02/07/2014  . Premature atrial contractions 01/13/2014  . Abnormality of gait 12/04/2013  . Hyperlipidemia 12/21/2011  . Cardiovascular risk factor 12/21/2011  . Bunion 01/31/2008  . Metatarsalgia of both feet 09/20/2007  . FLAT FOOT 09/20/2007  . HALLUX RIGIDUS, ACQUIRED 09/20/2007    [facility] ,MS, CCC-SLP  11/24/2016, 3:49 PM  [facility] [address] [place], [place], [address] Phone: [phone]   Fax:  [phone]   Name: Indica Marcott MRN: [id number] Date of Birth: 1951-01-21

## 2016-11-24 NOTE — Progress Notes (Signed)
Subjective:    Patient ID: Judy Spears, female    DOB: 11-Nov-1950, 66 y.o.   MRN: [id number]  Synopsis: In November she 2016 was bitten by a tick and then within 2 weeks had a progressive neurologic illness. Specifically she was found unresponsive by her husband while [place]. She was brought to local hospital where she required intubation due to unresponsiveness. Tracheostomy was performed on hospital day 2 and extensive workup for her neurologic illness begun. 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 She was then transferred for inpatient rehabilitation to the [facility] in [place]. While there she continued to participate in physical therapy and occupational therapy. However, during this time she was never able to consistently take by mouth and all nutrition has been provided the enteral feeding since around the time of her admission to the intensive care unit.  The tracheostomy has been in place since her original hospitalization in December 2016. It was briefly removed in May of 2017 but unfortunately she had a complication of laryngospasm and it sounds like the tracheostomy was replaced approximately 2 weeks later.  The tracheostomy was removed on 11/12/2015.  Not long afterwards she was hospitalized for healthcare associated pneumonia. This is a prolonged hospitalization complicated by encephalopathy and ultimately she ended up back in rehabilitation once again.  The tracheostomy was removed again in the spring of 2018. Shortly after this decannulation she required hospitalization once again for pneumonia.  Diagnosed with breast cancer and underwent lumpectomy under local anesthesia at [facility] summer 2018.  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  HPI Chief Complaint  Patient  presents with  . Follow-up    pt seen in hospital since last OV for low O2 sats, doing well today.     Since the last visit she had a spell of hypoxemia think they think wsa related to a mucus plugging.  She went to the ER and her O2 saturation had improved by the time she got there.  She had been having more cough and mucus production.  They had tried to suction and there wasn't much taht would ocme out.  .  They notes that her oxygen level had been lower 72-78% but [name] really didn't show signs of distress.    Past Medical History:  Diagnosis Date  . Arthritis    lt great toe  . Complication of anesthesia    pt has had headaches post op-did advise to hydra well preop  . Hair loss    right-sided  . History of exercise stress test    a. ETT (12/15) with 12:00 exercise, no ST changes, occasional PACs.   . Hyperlipidemia   . Insomnia   . Migraine headache   . Movement disorder    resltess in left legs  . Postmenopausal   . 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 Premature atrial contractions    a. Holter (12/15) with 8% PACs, no atrial runs or atrial fibrillation.       Review of Systems     Objective:   Physical Exam Vitals:   11/24/16 1050  BP: 130/66  Pulse: 83  SpO2: 97%    Gen: chronically ill appearing HENT: OP clear, trach in site, clean, neck supple PULM: CTA B, normal percussion CV: RRR, no mgr, trace edema GI: BS+, soft, nontender Derm: no cyanosis or  rash Neuro: awake, occasional tremor, follows commands   Records from her November 11, 2016 ER visit reviewed where she was seen for hypoxia which resolved in the emergency room  Records from her recent rehab stay reviewed where she was cared for spastic paraplegia post MRSA tracheobronchitis  CBC    Component Value Date/Time   WBC 8.9 11/11/2016 1626   RBC 4.92 11/11/2016 1626   HGB 14.2 11/11/2016 1626   HGB 13.1 09/08/2016 1245   HCT 43.6 11/11/2016 1626   HCT 40.7 09/08/2016 1245   PLT 333 11/11/2016 1626    PLT 363 09/08/2016 1245   MCV 88.6 11/11/2016 1626   MCV 90 09/08/2016 1245   MCH 28.9 11/11/2016 1626   MCHC 32.6 11/11/2016 1626   RDW 17.0 (H) 11/11/2016 1626   RDW 13.5 09/08/2016 1245   LYMPHSABS 1.2 10/01/2016 0639   MONOABS 0.9 10/01/2016 0639   EOSABS 0.2 10/01/2016 0639   BASOSABS 0.0 10/01/2016 0639   BMET    Component Value Date/Time   NA 134 (L) 11/11/2016 1626   NA 134 09/08/2016 1245   K 4.3 11/11/2016 1626   CL 92 (L) 11/11/2016 1626   CO2 28 11/11/2016 1626   GLUCOSE 81 11/11/2016 1626   BUN 18 11/11/2016 1626   BUN 10 09/08/2016 1245   CREATININE 0.31 (L) 11/11/2016 1626   CALCIUM 9.2 11/11/2016 1626   GFRNONAA >60 11/11/2016 1626   GFRAA >60 11/11/2016 1626         Assessment & Plan:   Chronic respiratory failure with hypoxia ([place]) - Plan: Ambulatory Referral for DME  Tracheostomy tube present ([place]) - Plan: Ambulatory Referral for DME  Cough  K has struggled recently with recurrent episodes of MRSA bronchitis/pneumonia.  She continues to have some difficulty producing mucus which I think is directly related to her neuromuscular weakness and overall neurologic condition.  However, her mucus production has been stable recently.  I think it is very reasonable for her to continue on mucociliary clearance measures with using the therapy vest on a day-to-day basis.  We can readdress this after 6-8 weeks.  I also think she should continue using guaifenesin for now.  I will give them a prescription for hypertonic saline nebulizers to be used incidence of difficult to clear chest secretions.  Plan: Recurrent episodes of bronchitis with mucus production: Keep taking Mucinex 4 times a day as you are doing (guaifenesin) Use the vest once daily If you notice that she is having trouble with producing mucus then use the hypertonic saline as needed to help her cough mucus out, do not use this more than twice a day If you use the hypertonic saline you need to also use  albuterol If she has more trouble with breathing or mucus production call me right away so that we can give her an antibiotic We will see you back in 6-8 weeks or sooner if needed  > 50% of this 28 minute visit spent face to face    Current Outpatient Prescriptions:  .  amantadine (SYMMETREL) 50 MG/5ML solution, Place 10 mLs (100 mg total) into feeding tube 2 (two) times daily. In am and at noon (Patient taking differently: Place 150 mg into feeding tube 2 (two) times daily. In am and at noon ), Disp: 473 mL, Rfl: 0 .  Amino Acids-Protein Hydrolys (FEEDING SUPPLEMENT, PRO-STAT SUGAR FREE 64,) LIQD, Place 30 mLs into feeding tube 2 (two) times daily., Disp: 1800 mL, Rfl: 0 .  amLODipine (NORVASC)  10 MG tablet, Place 1 tablet (10 mg total) into feeding tube daily., Disp: 30 tablet, Rfl: 11 .  atorvastatin (LIPITOR) 20 MG tablet, Place 20 mg into feeding tube at bedtime. , Disp: , Rfl:  .  baclofen (LIORESAL) 10 MG tablet, PLACE 1 TABLET INTO FEEDING TUBE EVERY 6HOURS, Disp: 120 each, Rfl: 0 .  Bethanechol Chloride (URECHOLINE PO), Take by mouth. BID- currently weaning off of, Disp: , Rfl:  .  chlorhexidine (PERIDEX) 0.12 % solution, 55ml IN THE MOUTH OR THROAT TWICE DAILY, Disp: 473 mL, Rfl: 0 .  clotrimazole (LOTRIMIN) 1 % cream, Apply topically 2 (two) times daily., Disp: 113 g, Rfl: 0 .  docusate sodium (COLACE) 100 MG capsule, Take 100 mg by mouth 2 (two) times daily., Disp: , Rfl:  .  gabapentin (NEURONTIN) 100 MG capsule, One capsule--open and administer via PEG twice a day., Disp: 60 capsule, Rfl: 0 .  guaiFENesin (ROBITUSSIN) 100 MG/5ML SOLN, Place 10 mLs (200 mg total) into feeding tube 4 (four) times daily. (Patient taking differently: Place 10 mLs into feeding tube as needed for to loosen phlegm. ), Disp: 1200 mL, Rfl: 0 .  loratadine (CLARITIN) 10 MG tablet, Place 1 tablet (10 mg total) into feeding tube daily as needed for allergies. (Patient taking differently: Place 10 mg into  feeding tube every morning. daily), Disp: , Rfl:  .  Melatonin 5 MG CAPS, Place 5 mg into feeding tube every evening. , Disp: , Rfl:  .  Multiple Vitamin (MULTIVITAMIN) LIQD, Place 15 mLs into feeding tube daily., Disp: 1 Bottle, Rfl: 3 .  Nutritional Supplements (FEEDING SUPPLEMENT, OSMOLITE 1.5 CAL,) LIQD, Place 1,000 mLs into feeding tube daily., Disp: , Rfl: 0 .  potassium chloride 20 MEQ/15ML (10%) SOLN, Place 15 mLs (20 mEq total) into feeding tube daily., Disp: 900 mL, Rfl: 5 .  primidone (MYSOLINE) 50 MG tablet, Place 1 tablet (50 mg total) into feeding tube 4 (four) times daily., Disp: 120 tablet, Rfl: 1 .  saccharomyces boulardii (FLORASTOR) 250 MG capsule, Place 1 capsule (250 mg total) into feeding tube 2 (two) times daily., Disp: 60 capsule, Rfl: 0 .  valACYclovir (VALTREX) 500 MG tablet, Take 500 mg by mouth daily., Disp: , Rfl:  .  Water For Irrigation, Sterile (FREE WATER) SOLN, Place 200 mLs into feeding tube every 8 (eight) hours., Disp: , Rfl:

## 2016-11-25 ENCOUNTER — Encounter: Payer: Medicare Other | Admitting: Speech Pathology

## 2016-11-25 ENCOUNTER — Other Ambulatory Visit: Payer: Self-pay

## 2016-11-25 ENCOUNTER — Telehealth: Payer: Self-pay | Admitting: Physical Therapy

## 2016-11-25 ENCOUNTER — Ambulatory Visit: Payer: Medicare Other | Admitting: Physical Therapy

## 2016-11-25 ENCOUNTER — Ambulatory Visit: Payer: Medicare Other | Admitting: Oncology

## 2016-11-25 ENCOUNTER — Other Ambulatory Visit: Payer: Self-pay | Admitting: Physical Medicine and Rehabilitation

## 2016-11-25 ENCOUNTER — Other Ambulatory Visit: Payer: Self-pay | Admitting: Physical Medicine & Rehabilitation

## 2016-11-25 DIAGNOSIS — A852 Arthropod-borne viral encephalitis, unspecified: Secondary | ICD-10-CM

## 2016-11-25 MED ORDER — BETHANECHOL CHLORIDE 25 MG PO TABS: 25.0000 mg | ORAL_TABLET | Freq: Two times a day (BID) | ORAL | 0 refills | Status: DC

## 2016-11-25 NOTE — Telephone Encounter (Signed)
Re ordered and pharmacy notified

## 2016-11-25 NOTE — Telephone Encounter (Signed)
Dr. [name],  Regarding Randolm Idol..... We did wheelchair modifications yesterday with Deberah Pelton from [employer] for seating modifications due to increased postural deformities which have progressively worsened in past 6 weeks.  She is significantly leaning toward left side (increases with fatigue) - so we are adding lateral supports (Just FYI on this - I'm doing LMN) We also did pressure mapping due to her pelvic obliquity with increased weight on Lt hip (going to change her cushion to one that is not only pressure relief but also positioning):  We have noticed Rt hip changes and do not know what is going on to cause this -- it was questioned during the eval if the Rt hip could be dislocated -- do you think that an x-ray needs to be done to determine what is going on in her hips?  We don't know how far to push with stretching and trying to increase ROM - she continues to make noises as if it is painful or very uncomfortable --- [name] and Leesa both were present during this wheelchair assessment and discussion regarding these postural changes within past 6 weeks or so.  Please advise - Thank you- Guido Sander, PT

## 2016-11-25 NOTE — Telephone Encounter (Signed)
Re ordering medication, sent wrong quantity previously, corrected and called pharmacy

## 2016-11-25 NOTE — Telephone Encounter (Signed)
Recieved electronic medication refill request for urecholine, last note stated:  Urinary retention: voiding regularly, incontinent             -wean urecholine to off over next 2-4 weeks  How should this be refilled for her?  Please advise

## 2016-11-25 NOTE — Telephone Encounter (Signed)
Actually Dr. [name], please disregard, I just found the information I needed.  URECHOLINE 25MG  TWICE DAILY FOR 2 WEEKS, THEN DECREASE TO DAILY FOR 2 WEEKS THEN STOP.

## 2016-11-25 NOTE — Telephone Encounter (Signed)
Pharmacy called stating that the refill request for bathanechol 25mg , the quantity doesn't match up with the directions.

## 2016-11-26 ENCOUNTER — Encounter: Payer: Self-pay | Admitting: Physical Therapy

## 2016-11-26 ENCOUNTER — Ambulatory Visit: Payer: Medicare Other | Attending: Physical Medicine & Rehabilitation | Admitting: Physical Therapy

## 2016-11-26 DIAGNOSIS — R471 Dysarthria and anarthria: Secondary | ICD-10-CM | POA: Diagnosis present

## 2016-11-26 DIAGNOSIS — R29818 Other symptoms and signs involving the nervous system: Secondary | ICD-10-CM

## 2016-11-26 DIAGNOSIS — R1312 Dysphagia, oropharyngeal phase: Secondary | ICD-10-CM | POA: Diagnosis present

## 2016-11-26 DIAGNOSIS — R41841 Cognitive communication deficit: Secondary | ICD-10-CM | POA: Diagnosis present

## 2016-11-26 DIAGNOSIS — R293 Abnormal posture: Secondary | ICD-10-CM | POA: Diagnosis not present

## 2016-11-26 DIAGNOSIS — R258 Other abnormal involuntary movements: Secondary | ICD-10-CM

## 2016-11-26 DIAGNOSIS — M6281 Muscle weakness (generalized): Secondary | ICD-10-CM | POA: Diagnosis present

## 2016-11-26 DIAGNOSIS — R2689 Other abnormalities of gait and mobility: Secondary | ICD-10-CM

## 2016-11-26 NOTE — Telephone Encounter (Signed)
I've discussed these changes also with them. If you or family feels that an xray is warranted we could pursue that. she did not have overt pain at last visit with ROM. I would recommend that they follow up with Dr. Leonie Man re: postural changes to get his input. (we discussed that as well). She might benefit from neuro-muscular/movement specialist

## 2016-11-26 NOTE — Therapy (Signed)
[facility] [address] [place], [place], [address] Phone: [phone]   Fax:  [phone]  Physical Therapy Treatment  Patient Details  Name: Judy Spears MRN: [id number] Date of Birth: 1950-07-24 Referring Provider: Alger Simons, MD  Encounter Date: 11/26/2016      PT End of Session - 11/26/16 1538    Visit Number 9   Number of Visits 20   Date for PT Re-Evaluation 12/27/16   Authorization Type [employer] MCR   PT Start Time 2951   PT Stop Time 1531   PT Time Calculation (min) 44 min   Equipment Utilized During Treatment Gait belt   Activity Tolerance Patient tolerated treatment well;No increased pain  pt nodded "no" to pain questions throughout session   Behavior During Therapy [facility] for tasks assessed/performed      Past Medical History:  Diagnosis Date  . Arthritis    lt great toe  . Complication of anesthesia    pt has had headaches post op-did advise to hydra well preop  . Hair loss    right-sided  . History of exercise stress test    a. ETT (12/15) with 12:00 exercise, no ST changes, occasional PACs.   . Hyperlipidemia   . Insomnia   . Migraine headache   . Movement disorder    resltess in left legs  . Postmenopausal   . 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 Premature atrial contractions    a. Holter (12/15) with 8% PACs, no atrial runs or atrial fibrillation.     Past Surgical History:  Procedure Laterality Date  . BREAST BIOPSY  01/01/2011   Procedure: BREAST BIOPSY WITH NEEDLE LOCALIZATION;  Surgeon: Rolm Bookbinder, MD;  Location: [facility];  Service: General;  Laterality: Left;  left breast wire localization biopsy  . BREAST BIOPSY Right 07/27/2012  . BREAST BIOPSY Right 07/07/2016  . BREAST BIOPSY Left 06/30/2016  . cataracts right eye    . CESAREAN SECTION  1986  . HERNIA REPAIR  2000   RIH  . IR [place] TUBE PERCUT W/FLUORO  09/29/2016  . opirectinal  membrane peel    . PEG PLACEMENT    . RHINOPLASTY  1983  . TRACHEOSTOMY      There were no vitals filed for this visit.      Subjective Assessment - 11/26/16 1537    Subjective No new complaints. Pt denies any pain today.   Patient is accompained by: --  caregiver Judy Spears   Pertinent History Precautions:  PEG.  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  Another round of botox in LLE on 02/06/16   Limitations House hold activities;Walking;Standing;Sitting;Lifting;Writing   Patient Stated Goals Per husband, "For [name] to be as independent as possible."   Currently in Pain? No/denies   Pain Score 0-No pain               [facility] Adult PT Treatment/Exercise - 11/26/16 1540      Transfers   Transfers Sit to Stand;Stand to Sit;Squat Pivot Transfers;Stand Pivot Transfers   Sit to Stand 4: Min assist;With upper extremity assist;From bed   Sit to Stand Details Manual facilitation for weight shifting;Verbal cues for sequencing;Verbal cues for technique;Manual facilitation for weight bearing;Tactile cues for sequencing;Tactile cues for posture   Sit to Stand Details (indicate cue type and  reason) cues/facilitation for "tall and long back" with hip flexion for anterior weight shifting with each stand.    Stand to Sit 4: Min assist;With upper extremity assist;To bed   Stand to Sit Details (indicate cue type and reason) Tactile cues for initiation;Tactile cues for weight shifting;Verbal cues for sequencing;Verbal cues for technique;Manual facilitation for weight shifting   Stand to Sit Details cues/facilitation for right knee/bil hip flexion to initiate sitting on mat table.    Stand Pivot Transfers 3: Mod assist;With armrests   Stand Pivot Transfer Details (indicate cue type and reason) wheelchair>mat table with  mod assist for standing balance with tranfser. mod assist of 2 for return to wheelchair with pt taking 5-6 steps from mat to chair. pt self advancing left leg and min/mod assist needed with right leg.    Comments sit<>stands performed x4 reps with decreased assistance needed today vs previous session (pt pain free today and more alert). in standing worked on hip extension, left knee extension and trunk extension/left shoulder back with 2 person assist to facilitate all the above. once posture was as good as it was able to be obtained worked on lateral weight shifting, progressing to alternating fwd/bwd stepping x 4-5 reps with 3/4 stands (transferd with steps on 4th stand). mod assist for standing balance with min/mod assist for right LE movements, min assist for left step placement at times. 2 person assist for safety with standing activities. Pt denied pain with all standing activities.      Neuro Re-ed    Neuro Re-ed Details  seated at edge of mat: pt noted to have left lateral lean after SPT to mat table. worked on right elbow taps to mat to facilitate right weight shiftng x 5 reps with min assist. pt more midline afterwards (this was performed again several times throughout the session to assist with midline positioning after activities). in more midline position: passive stretching of chest wall and neck musculature via SPTA thigh along spine with gentle overpressure applied to bil shoulders in posterior/inferior directions for 60 sec's x 5 reps. then worked on anterior/posterior weight shifitng with emphasis on "tall posture with long back" and bending at hips, used the cues to "push me away" and "pull me to you" with pt's hands on PTA's shoulders x ~7-8 reps. pt progressed from mod assist at edge of bed to supervision for static sitting balance with hands on her knees with midline posture (shoulders still in slight rotation) for up to 30-40 sec's several times through out session before needed assist due  to left lean.      Knee/Hip Exercises: Stretches   Passive Hamstring Stretch Left;2 reps;60 seconds;Limitations   Passive Hamstring Stretch Limitations with pt in supported sitting at edge of bed: right LE brought across PTA lap with gentle overpressure applied at knee for hamstring stretch.              PT Short Term Goals - 10/28/16 1950      PT SHORT TERM GOAL #1   Title Perform sit to/from stand from wheelchair or mat with +1 mod assist.   Baseline max to total assist on 10-28-16   Time 4   Period Weeks   Status New   Target Date 11/27/16     PT SHORT TERM GOAL #2   Title Pt will perform sit to stand from wheelchair to RW with mod assist.   Baseline max to total assist - 10-28-16   Time 4   Period Weeks  Status New   Target Date 11/27/16     PT SHORT TERM GOAL #3   Title Pt will improve standing balance/trunk control so that caregiver Judy Spears) is able to transfer pt using [place] without requiring a 2nd person for assist.     Baseline 2 people required with total assist - Charlaine Dalton is unable to be used at this time - 10-28-16   Time 4   Period Weeks   Status New   Target Date 11/27/16     PT SHORT TERM GOAL #4   Title Pt will sit with RUE support with mod assist unsupported on side of mat for at least 5" to demo improved trunk strength and core stabilization.   Baseline max assist needed on 10-28-16   Time 4   Period Weeks   Status New     PT SHORT TERM GOAL #5   Title Basic transfers from wheelchair to bed or commode with +1 mod assist.     Baseline max to total assist  - 10-28-16           PT Long Term Goals - 10/28/16 2004      PT LONG TERM GOAL #1   Title Pt will perform supine rolling R and L with min assist  to indicate improved independence with bed mobility.     Baseline dependent for all bed mobility per spouse's report - 10-28-16   Time 8   Period Weeks   Status New   Target Date 12/27/16     PT LONG TERM GOAL #2   Title Pt will sit on side of mat  with RUE support for at least 5" with min assist.   Baseline max assist on 10-28-16   Time 8   Period Weeks   Status New   Target Date 12/27/16     PT LONG TERM GOAL #3   Title Pt will transfer Lt/Rt sidelying to sitting with min assist for incr. independence with getting out of bed.     Baseline max to total assist needed - 10-28-16   Time 8   Period Weeks   Status New   Target Date 12/27/16     PT LONG TERM GOAL #4   Title Pt will perform basic transfers with min to mod assist to indicate improved functional independence.     Baseline max assist - 08-26-16   Time 8   Period Weeks   Status New   Target Date 12/27/16     PT LONG TERM GOAL #5   Title Pt will perform static standing with bil. UE support with mod assist.     Baseline pt able to stand in standing frame with mod assist to minimize deviations of shoulders leaning to left side with pelvis shifted to right side- 10-28-16   Time 8   Period Weeks   Status New   Target Date 12/27/16     PT LONG TERM GOAL #7   Title Perform sit to stand and stand to sit with mod assist.     Baseline max to total assist needed on 10-28-16   Time 8   Period Weeks   Status New            Plan - 11/26/16 1539    Clinical Impression Statement Today's skilled session continued to address posture, transfers and pre-gait activities with pt reporting no pain throughout session. Pt alert and very motivated with today's session, not wanting to sit for rest breaks with 2 of  the stands, kept working on stepping with LE's. Pt also attempted at one time to advance both feet fwd, not fwd/bwd as we had been doing. We questioned if she was tring to "sneak a walk" in, pt smiled very big and nodded her head "yes". Pt is progressing toward goals today and should benefit from continued PT to progress toward unmet goals.    Rehab Potential Fair   Clinical Impairments Affecting Rehab Potential Chronicity of condition, continued medical management, but has very  strong family and financial support   PT Frequency 3x / week   PT Duration 8 weeks   PT Treatment/Interventions ADLs/Self Care Home Management;DME Instruction;Gait training;Stair training;Functional mobility training;Therapeutic activities;Patient/family education;Cognitive remediation;Neuromuscular re-education;Therapeutic exercise;Balance training;Orthotic Fit/Training;Wheelchair mobility training;Manual techniques;Splinting;Visual/perceptual remediation/compensation;Vestibular;Passive range of motion;Electrical Stimulation   PT Next Visit Plan G-code next visit. cont to work on sitting balance, LE ROM as tolerated; partial squats as tolerated for LE weight-bearing, standing activities, including pregait as able, ? gait with 2 person assist using 3 muskateer technique    PT Home Exercise Plan family/caregiver has ROM exercises from previous admissions   Consulted and Agree with Plan of Care Patient;Family member/caregiver   Family Member Consulted caregiver [name]      Patient will benefit from skilled therapeutic intervention in order to improve the following deficits and impairments:  Abnormal gait, Decreased balance, Decreased endurance, Decreased cognition, Cardiopulmonary status limiting activity, Decreased activity tolerance, Decreased coordination, Decreased strength, Impaired flexibility, Impaired tone, Decreased mobility, Increased muscle spasms, Impaired vision/preception, Decreased range of motion, Impaired UE functional use, Postural dysfunction, Improper body mechanics, Impaired perceived functional ability, Decreased knowledge of precautions, Decreased knowledge of use of DME  Visit Diagnosis: Abnormal posture  Muscle weakness (generalized)  Other symptoms and signs involving the nervous system  Other abnormalities of gait and mobility  Other abnormal involuntary movements     Problem List Patient Active Problem List   Diagnosis Date Noted  . Pseudomonas urinary tract  infection 10/27/2016  . Severe muscle deconditioning 09/30/2016  . Pressure injury of skin 09/28/2016  . Respiratory failure ([place])   . Tracheobronchitis   . Urinary retention   . PEG (percutaneous endoscopic gastrostomy) status ([place])   . Benign essential HTN   . Hyponatremia   . Hypokalemia   . Reactive hypertension   . Respiratory insufficiency 09/24/2016  . Malignant neoplasm of upper-inner quadrant of left breast in female, estrogen receptor positive ([place]) 07/14/2016  . Heterotopic ossification of bone 03/10/2016  . Acute on chronic respiratory failure with hypoxia ([place])   . Acute hypoxemic respiratory failure ([place])   . Acute blood loss anemia   . Atelectasis 02/04/2016  . Debility 02/03/2016  . Pneumonia of both lower lobes due to Pseudomonas species ([place])   . Pneumonia of both lower lobes due to methicillin susceptible Staphylococcus aureus (MSSA) ([place])   . Acute respiratory failure with hypoxia ([place])   . Acute encephalopathy   . Seizures ([place])   . Sepsis ([place])   . Hypoxia   . Pain   . HCAP (healthcare-associated pneumonia) 12/28/2015  . Chronic respiratory failure ([place]) 12/28/2015  . Acute tracheobronchitis 12/24/2015  . Tracheostomy tube present ([place])   . Tracheal stenosis   . Chronic respiratory failure with hypoxia ([place])   . Spastic tetraplegia ([place]) 10/20/2015  . Tracheostomy status ([place]) 09/26/2015  . Allergic rhinitis 09/26/2015  . 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 Movement disorder 09/22/2015  . Encephalitis 09/22/2015  . Chest pain 02/07/2014  . Premature atrial contractions 01/13/2014  .  Abnormality of gait 12/04/2013  . Hyperlipidemia 12/21/2011  . Cardiovascular risk factor 12/21/2011  . Bunion 01/31/2008  . Metatarsalgia of both feet 09/20/2007  . FLAT FOOT 09/20/2007  . HALLUX RIGIDUS, ACQUIRED 09/20/2007    Willow Ora, PTA, [facility] [address] [place],   [address] [phone] 11/26/16, 4:50 PM   Name: Judy Spears MRN: [id number] Date of Birth: [birth year]

## 2016-11-29 ENCOUNTER — Ambulatory Visit: Payer: Medicare Other | Admitting: Speech Pathology

## 2016-11-29 ENCOUNTER — Ambulatory Visit: Payer: Medicare Other | Admitting: Occupational Therapy

## 2016-11-29 ENCOUNTER — Encounter: Payer: Self-pay | Admitting: Physical Therapy

## 2016-11-29 ENCOUNTER — Ambulatory Visit: Payer: Medicare Other | Admitting: Physical Therapy

## 2016-11-29 DIAGNOSIS — R1312 Dysphagia, oropharyngeal phase: Secondary | ICD-10-CM

## 2016-11-29 DIAGNOSIS — M6281 Muscle weakness (generalized): Secondary | ICD-10-CM

## 2016-11-29 DIAGNOSIS — R2689 Other abnormalities of gait and mobility: Secondary | ICD-10-CM

## 2016-11-29 DIAGNOSIS — R41841 Cognitive communication deficit: Secondary | ICD-10-CM

## 2016-11-29 DIAGNOSIS — R293 Abnormal posture: Secondary | ICD-10-CM | POA: Diagnosis not present

## 2016-11-29 DIAGNOSIS — R471 Dysarthria and anarthria: Secondary | ICD-10-CM

## 2016-11-29 NOTE — Therapy (Signed)
[facility] [address] [place], [place], [address] Phone: [phone]   Fax:  [phone]  Speech Language Pathology Treatment  Patient Details  Name: Judy Spears MRN: [id number] Date of Birth: 07/28/1950 Referring Provider: Dr. Quita Skye. [name]   Encounter Date: 11/29/2016  End of Session - 11/29/16 1804    Visit Number  6    Number of Visits  17    Date for SLP Re-Evaluation  01/05/17    SLP Start Time  6644    SLP Stop Time   1445    SLP Time Calculation (min)  42 min    Activity Tolerance  Patient tolerated treatment well       Past Medical History:  Diagnosis Date  . Arthritis    lt great toe  . Complication of anesthesia    pt has had headaches post op-did advise to hydra well preop  . Hair loss    right-sided  . History of exercise stress test    a. ETT (12/15) with 12:00 exercise, no ST changes, occasional PACs.   . Hyperlipidemia   . Insomnia   . Migraine headache   . Movement disorder    resltess in left legs  . Postmenopausal   . 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 Premature atrial contractions    a. Holter (12/15) with 8% PACs, no atrial runs or atrial fibrillation.     Past Surgical History:  Procedure Laterality Date  . BREAST BIOPSY Right 07/27/2012  . BREAST BIOPSY Right 07/07/2016  . BREAST BIOPSY Left 06/30/2016  . cataracts right eye    . CESAREAN SECTION  1986  . HERNIA REPAIR  2000   RIH  . IR [place] TUBE PERCUT W/FLUORO  09/29/2016  . opirectinal membrane peel    . PEG PLACEMENT    . RHINOPLASTY  1983  . TRACHEOSTOMY      There were no vitals filed for this visit.  Subjective Assessment - 11/29/16 1409    Subjective  "We forgot the eye link board."    Special Tests  husband [name], caregiver [name]    Currently in Pain?  No/denies            ADULT SLP TREATMENT - 11/29/16 1447      General Information   Behavior/Cognition   Cooperative;Distractible;Requires cueing;Pleasant mood;Lethargic    Patient Positioning  Upright in chair      Treatment Provided   Treatment provided  Cognitive-Linquistic      Cognitive-Linquistic Treatment   Treatment focused on  Voice;Dysarthria    Skilled Treatment  Pt arrives alert and participatory for initial period of 5 minutes before requiring mod verbal, tactile cues. Subsequent cues for alertness/attention required every ~10 minutes for remainder of session. With IMT trainer, SLP provided min-mod A for lip closure; despite max cues pt unable to coordinate inhalation (0/10 trials). Voicing notably weaker today; question impacts of pt's left leaning posture impacting breath support. Attained audible phonation with /a/ in 7/10 trials with usual mod A. Participated for 9 minutes in single word convergent naming task with mod tactile cue x1 for alertness. Pt's voice aphonic during this task despite demonstration and max cues for breath support, intensity. Will bring eyelink board to next session; now trialing Tobii-Dynavox i15 at home.       Tracheostomy Shiley 4 mm Uncuffed   Tracheostomy Properties Placement Date: 11/11/16 Placement Time: 1510 Inserted prior to hospital arrival?: Yes Brand: Shiley Size (mm): 4 mm Style:  Uncuffed     Assessment / Recommendations / Plan   Plan  Continue with current plan of care      Progression Toward Goals   Progression toward goals  Progressing toward goals         SLP Short Term Goals - 11/29/16 1744      SLP SHORT TERM GOAL #1   Title  Pt will demonstrate inspiratory and blowing exercises to increase respiratory support and coordination for vocalizations with usual mod A    Time  2    Period  Weeks    Status  On-going      SLP SHORT TERM GOAL #2   Title  Pt will achieve audible phonation with prolonged vowels and/or CV syllables in 75% of trials with usual mod A.     Time  2    Period  Weeks    Status  On-going      SLP SHORT TERM  GOAL #3   Title  Pt will coordinate respiration/phonation for imitation of single words in 75% of trials with usual mod A.     Baseline  11/22/16    Time  3    Period  Weeks    Status  On-going      SLP SHORT TERM GOAL #4   Title  Pt will sustain attention to therapy tasks for 7 minutes with mod A multimodal cues.     Baseline  11/29/16    Time  2    Period  Weeks    Status  On-going      SLP SHORT TERM GOAL #5   Title  Pt will participate in thorough oral care regimen at least 4 times per day and prior to trials of ice chips per caregiver report over 3 sessions.    Baseline  10.22.18    Time  2    Period  Weeks    Status  On-going       SLP Long Term Goals - 11/29/16 1801      SLP LONG TERM GOAL #1   Title  Pt will demonstrate cough/swallow for secretion management with min A from caregiver over 5 sessions.    Time  6    Period  Weeks    Status  On-going      SLP LONG TERM GOAL #2   Title  Pt will demonstrate audible phonation/intelligible speech for 2 word phrases with usual min A in 7/10 trials.    Time  6    Period  Weeks    Status  On-going      SLP LONG TERM GOAL #3   Title  Pt will communicate a basic want/ need by any functional means, including AAC, with mod A from caregivers over 3 sessions.    Time  6    Period  Weeks    Status  On-going      SLP LONG TERM GOAL #4   Title  Pt will demonstrate appearance of timely swallow/ functional oral manipulation of ice chips in 4/5 trials with usual mod A.     Time  6    Period  Weeks    Status  On-going       Plan - 11/29/16 1804    Clinical Impression Statement  Patient presents with ongoing deficits in speech intelligibility (dysarthria, reduced breath support), cognitive communication and swallowing. Pt's alertness, attention improved this session; alert and attentive for 90% of session with 4 mod tactile cues required. Pt is  progressing slowly toward goals, though progress with individual goals has been  inconsistent session to session. Recommend cont'd skilled to address pt's decline in communication and swallowing  in order to maximize functional communication, secretion management/ swallowing safety, reduce caregiver burden and improve quality of life.    Speech Therapy Frequency  2x / week    Treatment/Interventions  Aspiration precaution training;Trials of upgraded texture/liquids;Compensatory strategies;Functional tasks;Patient/family education;Cueing hierarchy;Diet toleration management by SLP;Cognitive reorganization;Compensatory techniques;SLP instruction and feedback;Internal/external aids;Multimodal communcation approach;Pharyngeal strengthening exercises    Potential to Achieve Goals  Fair    Potential Considerations  Ability to learn/carryover information;Co-morbidities;Severity of impairments;Cooperation/participation level;Previous level of function;Pain level    SLP Home Exercise Plan  sustained /a/, vowel phonation    Consulted and Agree with Plan of Care  Patient;Family member/caregiver       Patient will benefit from skilled therapeutic intervention in order to improve the following deficits and impairments:   Dysarthria and anarthria  Cognitive communication deficit  Dysphagia, oropharyngeal phase    Problem List Patient Active Problem List   Diagnosis Date Noted  . Pseudomonas urinary tract infection 10/27/2016  . Severe muscle deconditioning 09/30/2016  . Pressure injury of skin 09/28/2016  . Respiratory failure ([place])   . Tracheobronchitis   . Urinary retention   . PEG (percutaneous endoscopic gastrostomy) status ([place])   . Benign essential HTN   . Hyponatremia   . Hypokalemia   . Reactive hypertension   . Respiratory insufficiency 09/24/2016  . Malignant neoplasm of upper-inner quadrant of left breast in female, estrogen receptor positive ([place]) 07/14/2016  . Heterotopic ossification of bone 03/10/2016  . Acute on chronic respiratory failure with hypoxia ([place])    . Acute hypoxemic respiratory failure ([place])   . Acute blood loss anemia   . Atelectasis 02/04/2016  . Debility 02/03/2016  . Pneumonia of both lower lobes due to Pseudomonas species ([place])   . Pneumonia of both lower lobes due to methicillin susceptible Staphylococcus aureus (MSSA) ([place])   . Acute respiratory failure with hypoxia ([place])   . Acute encephalopathy   . Seizures ([place])   . Sepsis ([place])   . Hypoxia   . Pain   . HCAP (healthcare-associated pneumonia) 12/28/2015  . Chronic respiratory failure ([place]) 12/28/2015  . Acute tracheobronchitis 12/24/2015  . Tracheostomy tube present ([place])   . Tracheal stenosis   . Chronic respiratory failure with hypoxia ([place])   . Spastic tetraplegia ([place]) 10/20/2015  . Tracheostomy status ([place]) 09/26/2015  . Allergic rhinitis 09/26/2015  . 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 Movement disorder 09/22/2015  . Encephalitis 09/22/2015  . Chest pain 02/07/2014  . Premature atrial contractions 01/13/2014  . Abnormality of gait 12/04/2013  . Hyperlipidemia 12/21/2011  . Cardiovascular risk factor 12/21/2011  . Bunion 01/31/2008  . Metatarsalgia of both feet 09/20/2007  . FLAT FOOT 09/20/2007  . HALLUX RIGIDUS, ACQUIRED 09/20/2007   Deneise Lever, [place], [place] Speech-Language Pathologist  Aliene Altes 11/29/2016, 6:10 PM  [facility] [address] [place], [place], [address] Phone: [phone]   Fax:  [phone]   Name: Kenidy Crossland MRN: [id number] Date of Birth: [birth year]

## 2016-11-29 NOTE — Therapy (Signed)
[facility] [address] [place], [place], [address] Phone: [phone]   Fax:  [phone]  Physical Therapy Treatment  Patient Details  Name: Judy Spears MRN: [id number] Date of Birth: [birth year] Referring Provider: Alger Simons, MD   Encounter Date: 11/29/2016  PT End of Session - 11/29/16 2016    Visit Number  10    Number of Visits  20    Date for PT Re-Evaluation  12/27/16    Authorization Type  [employer] MCR    PT Start Time  1532    PT Stop Time  1620    PT Time Calculation (min)  48 min       Past Medical History:  Diagnosis Date  . Arthritis    lt great toe  . Complication of anesthesia    pt has had headaches post op-did advise to hydra well preop  . Hair loss    right-sided  . History of exercise stress test    a. ETT (12/15) with 12:00 exercise, no ST changes, occasional PACs.   . Hyperlipidemia   . Insomnia   . Migraine headache   . Movement disorder    resltess in left legs  . Postmenopausal   . 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 Premature atrial contractions    a. Holter (12/15) with 8% PACs, no atrial runs or atrial fibrillation.     Past Surgical History:  Procedure Laterality Date  . BREAST BIOPSY Right 07/27/2012  . BREAST BIOPSY Right 07/07/2016  . BREAST BIOPSY Left 06/30/2016  . cataracts right eye    . CESAREAN SECTION  1986  . HERNIA REPAIR  2000   RIH  . IR [place] TUBE PERCUT W/FLUORO  09/29/2016  . opirectinal membrane peel    . PEG PLACEMENT    . RHINOPLASTY  1983  . TRACHEOSTOMY      There were no vitals filed for this visit.  Subjective Assessment - 11/29/16 2004    Subjective  Pt very sleepy - had 45" wait time due to no OT today; husband states pt is scheduled to have x ray done next week in [place] (same time as MRI is going to be done)    Pertinent History  Precautions:  PEG.  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  Another round of botox in LLE on 02/06/16    Patient Stated Goals  Per husband, "For Gleason to be as independent as possible."    Currently in Pain?  No/denies                      [facility] Adult PT Treatment/Exercise - 11/29/16 1428      Bed Mobility   Bed Mobility  Supine to Sit;Sit to Supine    Supine to Sit  1: +2 Total assist    Supine to Sit: Patient Percentage  10%      Transfers   Transfers  Sit to Stand;Stand to Sit;Squat Pivot Transfers    Sit to Stand  3: Mod assist    Sit to Stand Details  Manual facilitation for weight shifting;Verbal cues for sequencing;Verbal cues for technique;Manual facilitation for weight bearing;Tactile cues for sequencing;Tactile cues for posture    Stand to Sit  3: Mod assist    Stand to Sit Details (indicate cue  type and reason)  Tactile cues for initiation;Tactile cues for weight shifting;Verbal cues for sequencing;Verbal cues for technique;Manual facilitation for weight shifting    Stand Pivot Transfers  2: Max assist       Pt was transferred from wheelchair to mat toward Rt side with +1 total assist  NeuroRe-ed:  Worked on unsupported sitting balance on side of mat - max to mod assist needed for balance:  Sit to Rt and Lt sidelying With return to sitting with mod assist PTA student performed passive shoulder stretching to decr. Rounded shoulders with pt in seated position Worked on weight shifting in seated position with mod to max assist  Pt transferred to supine position with use of wedge and 3 pillows - attempted PROM of bil. LE's - pt moaning as if in pain -- Mod assist needed to prevent pt from leaning excessively toward left side  Total assist +2 needed for pt to transfer supine to sitting  TherAct:    Performed sit to stand with +2 max assist; pt performed  stepping up and back with each foot with mod assist - unable to clear Rt toes due to Decreased dorsiflexion; sit to stand performed approx. 5 reps  Pt performed lateral weight shifting in standing with max to total assist        PT Short Term Goals - 11/29/16 2017      PT SHORT TERM GOAL #1   Title  Perform sit to/from stand from wheelchair or mat with +1 mod assist.    Baseline  max to total assist on 10-28-16; max to total assist on 11-29-16    Time  4    Period  Weeks    Status  Not Met      PT SHORT TERM GOAL #2   Title  Pt will perform sit to stand from wheelchair to RW with mod assist.    Baseline  max to total assist - 11-29-16    Time  4    Period  Weeks    Status  Not Met      PT SHORT TERM GOAL #3   Title  Pt will improve standing balance/trunk control so that caregiver [name]) is able to transfer pt using Stedy without requiring a 2nd person for assist.      Baseline  using Stedy - assistance needed  varies    11-29-16    Status  Partially Met      PT SHORT TERM GOAL #4   Title  Pt will sit with RUE support with mod assist unsupported on side of mat for at least 5" to demo improved trunk strength and core stabilization.    Baseline  performance varies - pt more alert on some days than on other days - able to perform this activity when she is more alert  11-29-16    Time  4    Period  Weeks    Status  Partially Met      PT SHORT TERM GOAL #5   Title  Basic transfers from wheelchair to bed or commode with +1 mod assist.      Baseline  total assist -    11-29-16    Time  4    Period  Weeks    Status  Not Met      PT SHORT TERM GOAL #6   Title  Amb. 29' with RW with +1 min assist.  09-26-16    Baseline  pt is nonambulatory at this time -  11-29-16    Status  Not Met        PT Long Term Goals - 10/28/16 2004      PT LONG TERM GOAL #1   Title  Pt will perform supine rolling R and L with min assist  to indicate improved independence with bed mobility.      Baseline   dependent for all bed mobility per spouse's report - 10-28-16    Time  8    Period  Weeks    Status  New    Target Date  12/27/16      PT LONG TERM GOAL #2   Title  Pt will sit on side of mat with RUE support for at least 5" with min assist.    Baseline  max assist on 10-28-16    Time  8    Period  Weeks    Status  New    Target Date  12/27/16      PT LONG TERM GOAL #3   Title  Pt will transfer Lt/Rt sidelying to sitting with min assist for incr. independence with getting out of bed.      Baseline  max to total assist needed - 10-28-16    Time  8    Period  Weeks    Status  New    Target Date  12/27/16      PT LONG TERM GOAL #4   Title  Pt will perform basic transfers with min to mod assist to indicate improved functional independence.      Baseline  max assist - 08-26-16    Time  8    Period  Weeks    Status  New    Target Date  12/27/16      PT LONG TERM GOAL #5   Title  Pt will perform static standing with bil. UE support with mod assist.      Baseline  pt able to stand in standing frame with mod assist to minimize deviations of shoulders leaning to left side with pelvis shifted to right side- 10-28-16    Time  8    Period  Weeks    Status  New    Target Date  12/27/16      PT LONG TERM GOAL #7   Title  Perform sit to stand and stand to sit with mod assist.      Baseline  max to total assist needed on 10-28-16    Time  8    Period  Weeks    Status  New            Plan - 11/29/16 2024    Clinical Impression Statement  Pt has not fully met any STG's due to fluctuations in status with alertness varying depending on fatigue; pt's Rt hip now "clunking" with movement and with weight bearing in transfers. Difficult to determine if painful as pt shakes head no when asked if she has pain, however, moaning and verbalizations occur with lying in reclined position. Pt unable to adduct and internally rotate LLE in reclined position.  Pt appears to have pelvic obliquity with Lt  pelvis rotated anteriorly  Rehab Potential  Fair    Clinical Impairments Affecting Rehab Potential  Chronicity of condition, continued medical management, but has very strong family and financial support    PT Frequency  3x / week    PT Duration  8 weeks    PT Treatment/Interventions  ADLs/Self Care Home Management;DME Instruction;Gait training;Stair training;Functional mobility training;Therapeutic activities;Patient/family education;Cognitive remediation;Neuromuscular re-education;Therapeutic exercise;Balance training;Orthotic Fit/Training;Wheelchair mobility training;Manual techniques;Splinting;Visual/perceptual remediation/compensation;Vestibular;Passive range of motion;Electrical Stimulation    PT Next Visit Plan  cont to work on sitting balance, LE ROM as tolerated; partial squats as tolerated for LE weight-bearing, standing activities, including pregait as able -- x ray of Rt hip scheduled for next week    PT Home Exercise Plan  family/caregiver has ROM exercises from previous admissions    Consulted and Agree with Plan of Care  Patient;Family member/caregiver    Family Member Consulted  caregiver [name] and husband [name]       Patient will benefit from skilled therapeutic intervention in order to improve the following deficits and impairments:  Abnormal gait, Decreased balance, Decreased endurance, Decreased cognition, Cardiopulmonary status limiting activity, Decreased activity tolerance, Decreased coordination, Decreased strength, Impaired flexibility, Impaired tone, Decreased mobility, Increased muscle spasms, Impaired vision/preception, Decreased range of motion, Impaired UE functional use, Postural dysfunction, Improper body mechanics, Impaired perceived functional ability, Decreased knowledge of precautions, Decreased knowledge of use of DME  Visit Diagnosis: Abnormal  posture  Muscle weakness (generalized)  Other abnormalities of gait and mobility   G-Codes - 19-Dec-2016 04-20-2036    Functional Assessment Tool Used (Outpatient Only)  pt is dependent for all mobility - unable to sit unsupported, unable to stand/ambulate    Functional Limitation  Mobility: Walking and moving around    Mobility: Walking and Moving Around Current Status [phone])  At least 80 percent but less than 100 percent impaired, limited or restricted    Mobility: Walking and Moving Around Goal Status [phone])  At least 60 percent but less than 80 percent impaired, limited or restricted       Problem List Patient Active Problem List   Diagnosis Date Noted  . Pseudomonas urinary tract infection 10/27/2016  . Severe muscle deconditioning 09/30/2016  . Pressure injury of skin 09/28/2016  . Respiratory failure ([place])   . Tracheobronchitis   . Urinary retention   . PEG (percutaneous endoscopic gastrostomy) status ([place])   . Benign essential HTN   . Hyponatremia   . Hypokalemia   . Reactive hypertension   . Respiratory insufficiency 09/24/2016  . Malignant neoplasm of upper-inner quadrant of left breast in female, estrogen receptor positive ([place]) 07/14/2016  . Heterotopic ossification of bone 03/10/2016  . Acute on chronic respiratory failure with hypoxia ([place])   . Acute hypoxemic respiratory failure ([place])   . Acute blood loss anemia   . Atelectasis 02/04/2016  . Debility 02/03/2016  . Pneumonia of both lower lobes due to Pseudomonas species ([place])   . Pneumonia of both lower lobes due to methicillin susceptible Staphylococcus aureus (MSSA) ([place])   . Acute respiratory failure with hypoxia ([place])   . Acute encephalopathy   . Seizures ([place])   . Sepsis ([place])   . Hypoxia   . Pain   . HCAP (healthcare-associated pneumonia) 12/28/2015  . Chronic respiratory failure ([place]) 12/28/2015  . Acute tracheobronchitis 12/24/2015  . Tracheostomy tube present ([place])   . Tracheal stenosis   . Chronic  respiratory failure with hypoxia ([place])   . Spastic tetraplegia ([place]) 10/20/2015  . Tracheostomy status ([place]) 09/26/2015  .  Allergic rhinitis 09/26/2015  . 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 Movement disorder 09/22/2015  . Encephalitis 09/22/2015  . Chest pain 02/07/2014  . Premature atrial contractions 01/13/2014  . Abnormality of gait 12/04/2013  . Hyperlipidemia 12/21/2011  . Cardiovascular risk factor 12/21/2011  . Bunion 01/31/2008  . Metatarsalgia of both feet 09/20/2007  . FLAT FOOT 09/20/2007  . HALLUX RIGIDUS, ACQUIRED 09/20/2007    Physical Therapy Progress Note  Dates of Reporting Period: 10-28-16 to 11-29-16  Objective Reports of Subjective Statement: Pt is dependent for all mobility; unable to stand without max to total assist at this time  Objective Measurements: pt continues to require manual wheelchair for mobility - is dependent for propulsion and all mobility; pt has postural abnormality With pelvic obliquity and trunk lateral lean toward left side   Goal Update: see above - no STG's have been achieved; pt has had change in status since August 2018 with hospital admission due to infection with inpatient rehab admission  Plan: See above for treatment plan and interventions  Reason Skilled Services are Required: Dependency with all mobility due to muscle weakness, postural abnormality, increased tone; decreased balance and inability to stand/ambulate   Samaa Ueda, [place], PT 11/29/2016, 8:40 PM  [facility] [address] [place], [place], [address] Phone: [phone]   Fax:  [phone]  Name: Ameli Sangiovanni MRN: [id number] Date of Birth: January 29, 1950

## 2016-12-01 ENCOUNTER — Ambulatory Visit: Payer: Medicare Other | Admitting: Physical Therapy

## 2016-12-01 ENCOUNTER — Ambulatory Visit: Payer: Medicare Other | Admitting: Occupational Therapy

## 2016-12-01 ENCOUNTER — Ambulatory Visit: Payer: Medicare Other

## 2016-12-01 DIAGNOSIS — R1312 Dysphagia, oropharyngeal phase: Secondary | ICD-10-CM

## 2016-12-01 DIAGNOSIS — R471 Dysarthria and anarthria: Secondary | ICD-10-CM

## 2016-12-01 DIAGNOSIS — R293 Abnormal posture: Secondary | ICD-10-CM | POA: Diagnosis not present

## 2016-12-01 DIAGNOSIS — R41841 Cognitive communication deficit: Secondary | ICD-10-CM

## 2016-12-01 NOTE — Therapy (Signed)
[facility] [address] [place], [place], [address] Phone: [phone]   Fax:  [phone]  Speech Language Pathology Treatment  Patient Details  Name: Judy Spears MRN: [id number] Date of Birth: 07/07/1950 Referring Provider: Dr. Quita Skye. [name]   Encounter Date: 12/01/2016  End of Session - 12/01/16 1639    Visit Number  7    Number of Visits  17    Date for SLP Re-Evaluation  01/05/17    SLP Start Time  1404    SLP Stop Time   1446    SLP Time Calculation (min)  42 min    Activity Tolerance  Patient tolerated treatment well       Past Medical History:  Diagnosis Date  . Arthritis    lt great toe  . Complication of anesthesia    pt has had headaches post op-did advise to hydra well preop  . Hair loss    right-sided  . History of exercise stress test    a. ETT (12/15) with 12:00 exercise, no ST changes, occasional PACs.   . Hyperlipidemia   . Insomnia   . Migraine headache   . Movement disorder    resltess in left legs  . Postmenopausal   . 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 Premature atrial contractions    a. Holter (12/15) with 8% PACs, no atrial runs or atrial fibrillation.     Past Surgical History:  Procedure Laterality Date  . BREAST BIOPSY Right 07/27/2012  . BREAST BIOPSY Right 07/07/2016  . BREAST BIOPSY Left 06/30/2016  . cataracts right eye    . CESAREAN SECTION  1986  . HERNIA REPAIR  2000   RIH  . IR [place] TUBE PERCUT W/FLUORO  09/29/2016  . opirectinal membrane peel    . PEG PLACEMENT    . RHINOPLASTY  1983  . TRACHEOSTOMY      There were no vitals filed for this visit.  Subjective Assessment - 12/01/16 1409    Subjective  "We have the TOBII in our house  now but she can't focus - her eyes move alot."    Patient is accompained by:  Family member [name] and [employer] and [place]   Currently in Pain?  No/denies            ADULT SLP TREATMENT -  12/01/16 1410      General Information   Behavior/Cognition  Cooperative;Distractible;Requires cueing;Pleasant mood;Alert    Patient Positioning  Upright in chair      Treatment Provided   Treatment provided  Cognitive-Linquistic      Cognitive-Linquistic Treatment   Treatment focused on  Voice;Dysarthria    Skilled Treatment  Pt alert for 90% of session today. Husband brought transparent eye gaze board. Pt with hydrophonic voice intitially upon entry to [facility] room - unaware, so SLP cued pt to swallow, did not clear, second swallow and cleared. SLP used consistent gestural cues for full breath (arms stretched out to sides) with demo cues for ALL productions today. Pt imitated vowels with voicing 70% success. Imitated words (dog's name, children's names, etc) 75% of the time with voice, answered wh?s spontaneously 60% with voicing, incr'd to 90% with max A. Pt provided verbal yes/no to SLP questions accurately 15/16, with voicing 70%, with functional artic 20%. SLP cued pt to overarticulate and pt used when asked 90% of the time, however this only improved pt's intelligibility to 33%.       Tracheostomy  Shiley 4 mm Uncuffed   Tracheostomy Properties Placement Date: 11/11/16 Placement Time: 1510 Inserted prior to hospital arrival?: Yes Brand: Shiley Size (mm): 4 mm Style: Uncuffed   Status  Capped;Secured    Site Assessment  Clean;Dry    Ties Assessment  Clean;Dry;Secure      Assessment / Recommendations / Plan   Plan  Continue with current plan of care      Progression Toward Goals   Progression toward goals  Progressing toward goals         SLP Short Term Goals - 12/01/16 1641      SLP SHORT TERM GOAL #1   Title  Pt will demonstrate inspiratory and blowing exercises to increase respiratory support and coordination for vocalizations with usual mod A    Time  2    Period  Weeks    Status  On-going      SLP SHORT TERM GOAL #2   Title  Pt will achieve audible phonation with prolonged  vowels and/or CV syllables in 75% of trials with usual mod A.     Time  2    Period  Weeks    Status  On-going      SLP SHORT TERM GOAL #3   Title  Pt will coordinate respiration/phonation for imitation of single words in 75% of trials with usual mod A.     Baseline  11/22/16    Time  3    Period  Weeks    Status  On-going      SLP SHORT TERM GOAL #4   Title  Pt will sustain attention to therapy tasks for 7 minutes with mod A multimodal cues.     Baseline  11/29/16    Time  2    Period  Weeks    Status  On-going      SLP SHORT TERM GOAL #5   Title  Pt will participate in thorough oral care regimen at least 4 times per day and prior to trials of ice chips per caregiver report over 3 sessions.    Baseline  10.22.18    Time  2    Period  Weeks    Status  On-going       SLP Long Term Goals - 11/29/16 1801      SLP LONG TERM GOAL #1   Title  Pt will demonstrate cough/swallow for secretion management with min A from caregiver over 5 sessions.    Time  6    Period  Weeks    Status  On-going      SLP LONG TERM GOAL #2   Title  Pt will demonstrate audible phonation/intelligible speech for 2 word phrases with usual min A in 7/10 trials.    Time  6    Period  Weeks    Status  On-going      SLP LONG TERM GOAL #3   Title  Pt will communicate a basic want/ need by any functional means, including AAC, with mod A from caregivers over 3 sessions.    Time  6    Period  Weeks    Status  On-going      SLP LONG TERM GOAL #4   Title  Pt will demonstrate appearance of timely swallow/ functional oral manipulation of ice chips in 4/5 trials with usual mod A.     Time  6    Period  Weeks    Status  On-going       Plan -  12/01/16 1640    Clinical Impression Statement  Patient presents with ongoing deficits in speech intelligibility (dysarthria, reduced breath support), cognitive communication and swallowing. Pt's alertness improved this session from last session this SLP saw pt. Pt has  been more alert for last two sessions with ST. Recommend cont'd skilled to address pt's decline in communication and swallowing  in order to maximize functional communication, secretion management/ swallowing safety, reduce caregiver burden and improve quality of life.    Speech Therapy Frequency  2x / week    Treatment/Interventions  Aspiration precaution training;Trials of upgraded texture/liquids;Compensatory strategies;Functional tasks;Patient/family education;Cueing hierarchy;Diet toleration management by SLP;Cognitive reorganization;Compensatory techniques;SLP instruction and feedback;Internal/external aids;Multimodal communcation approach;Pharyngeal strengthening exercises    Potential to Achieve Goals  Fair    Potential Considerations  Ability to learn/carryover information;Co-morbidities;Severity of impairments;Cooperation/participation level;Previous level of function;Pain level    SLP Home Exercise Plan  sustained /a/, vowel phonation    Consulted and Agree with Plan of Care  Patient;Family member/caregiver       Patient will benefit from skilled therapeutic intervention in order to improve the following deficits and impairments:   Dysarthria and anarthria  Dysphagia, oropharyngeal phase  Cognitive communication deficit    Problem List Patient Active Problem List   Diagnosis Date Noted  . Pseudomonas urinary tract infection 10/27/2016  . Severe muscle deconditioning 09/30/2016  . Pressure injury of skin 09/28/2016  . Respiratory failure ([place])   . Tracheobronchitis   . Urinary retention   . PEG (percutaneous endoscopic gastrostomy) status ([place])   . Benign essential HTN   . Hyponatremia   . Hypokalemia   . Reactive hypertension   . Respiratory insufficiency 09/24/2016  . Malignant neoplasm of upper-inner quadrant of left breast in female, estrogen receptor positive ([place]) 07/14/2016  . Heterotopic ossification of bone 03/10/2016  . Acute on chronic respiratory failure with  hypoxia ([place])   . Acute hypoxemic respiratory failure ([place])   . Acute blood loss anemia   . Atelectasis 02/04/2016  . Debility 02/03/2016  . Pneumonia of both lower lobes due to Pseudomonas species ([place])   . Pneumonia of both lower lobes due to methicillin susceptible Staphylococcus aureus (MSSA) ([place])   . Acute respiratory failure with hypoxia ([place])   . Acute encephalopathy   . Seizures ([place])   . Sepsis ([place])   . Hypoxia   . Pain   . HCAP (healthcare-associated pneumonia) 12/28/2015  . Chronic respiratory failure ([place]) 12/28/2015  . Acute tracheobronchitis 12/24/2015  . Tracheostomy tube present ([place])   . Tracheal stenosis   . Chronic respiratory failure with hypoxia ([place])   . Spastic tetraplegia ([place]) 10/20/2015  . Tracheostomy status ([place]) 09/26/2015  . Allergic rhinitis 09/26/2015  . 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 Movement disorder 09/22/2015  . Encephalitis 09/22/2015  . Chest pain 02/07/2014  . Premature atrial contractions 01/13/2014  . Abnormality of gait 12/04/2013  . Hyperlipidemia 12/21/2011  . Cardiovascular risk factor 12/21/2011  . Bunion 01/31/2008  . Metatarsalgia of both feet 09/20/2007  . FLAT FOOT 09/20/2007  . HALLUX RIGIDUS, ACQUIRED 09/20/2007    [facility] ,[place], CCC-SLP  12/01/2016, 4:41 PM  [facility] [address] [place], [place], [address] Phone: [phone]   Fax:  [phone]   Name: Judy Spears MRN: [id number] Date of Birth: February 11, 1950

## 2016-12-02 ENCOUNTER — Other Ambulatory Visit: Payer: Self-pay | Admitting: Physical Medicine & Rehabilitation

## 2016-12-03 ENCOUNTER — Ambulatory Visit: Payer: Medicare Other | Admitting: Physical Therapy

## 2016-12-03 ENCOUNTER — Encounter: Payer: Medicare Other | Admitting: Occupational Therapy

## 2016-12-05 ENCOUNTER — Emergency Department (HOSPITAL_COMMUNITY): Payer: Medicare Other

## 2016-12-05 ENCOUNTER — Inpatient Hospital Stay (HOSPITAL_COMMUNITY)
Admission: EM | Admit: 2016-12-05 | Discharge: 2017-01-04 | DRG: 870 | Disposition: A | Discharge status: Rehabilitation Facility | Payer: Medicare Other | Attending: Family Medicine | Admitting: Family Medicine

## 2016-12-05 ENCOUNTER — Encounter (HOSPITAL_COMMUNITY): Payer: Self-pay

## 2016-12-05 DIAGNOSIS — E870 Hyperosmolality and hypernatremia: Secondary | ICD-10-CM | POA: Diagnosis not present

## 2016-12-05 DIAGNOSIS — R64 Cachexia: Secondary | ICD-10-CM | POA: Diagnosis present

## 2016-12-05 DIAGNOSIS — R6520 Severe sepsis without septic shock: Secondary | ICD-10-CM | POA: Diagnosis present

## 2016-12-05 DIAGNOSIS — Z800 Family history of malignant neoplasm of digestive organs: Secondary | ICD-10-CM

## 2016-12-05 DIAGNOSIS — Z993 Dependence on wheelchair: Secondary | ICD-10-CM | POA: Diagnosis not present

## 2016-12-05 DIAGNOSIS — Y838 Other surgical procedures as the cause of abnormal reaction of the patient, or of later complication, without mention of misadventure at the time of the procedure: Secondary | ICD-10-CM | POA: Diagnosis present

## 2016-12-05 DIAGNOSIS — G0490 Encephalitis and encephalomyelitis, unspecified: Secondary | ICD-10-CM | POA: Diagnosis not present

## 2016-12-05 DIAGNOSIS — R7401 Elevation of levels of liver transaminase levels: Secondary | ICD-10-CM

## 2016-12-05 DIAGNOSIS — E785 Hyperlipidemia, unspecified: Secondary | ICD-10-CM | POA: Diagnosis present

## 2016-12-05 DIAGNOSIS — R0902 Hypoxemia: Secondary | ICD-10-CM | POA: Diagnosis not present

## 2016-12-05 DIAGNOSIS — J9621 Acute and chronic respiratory failure with hypoxia: Secondary | ICD-10-CM | POA: Diagnosis present

## 2016-12-05 DIAGNOSIS — R1310 Dysphagia, unspecified: Secondary | ICD-10-CM | POA: Diagnosis present

## 2016-12-05 DIAGNOSIS — J9600 Acute respiratory failure, unspecified whether with hypoxia or hypercapnia: Secondary | ICD-10-CM | POA: Diagnosis present

## 2016-12-05 DIAGNOSIS — D649 Anemia, unspecified: Secondary | ICD-10-CM | POA: Diagnosis present

## 2016-12-05 DIAGNOSIS — Z853 Personal history of malignant neoplasm of breast: Secondary | ICD-10-CM

## 2016-12-05 DIAGNOSIS — L89152 Pressure ulcer of sacral region, stage 2: Secondary | ICD-10-CM | POA: Diagnosis not present

## 2016-12-05 DIAGNOSIS — E871 Hypo-osmolality and hyponatremia: Secondary | ICD-10-CM | POA: Diagnosis present

## 2016-12-05 DIAGNOSIS — J15212 Pneumonia due to Methicillin resistant Staphylococcus aureus: Secondary | ICD-10-CM | POA: Diagnosis present

## 2016-12-05 DIAGNOSIS — R197 Diarrhea, unspecified: Secondary | ICD-10-CM | POA: Diagnosis not present

## 2016-12-05 DIAGNOSIS — R740 Nonspecific elevation of levels of transaminase and lactic acid dehydrogenase [LDH]: Secondary | ICD-10-CM | POA: Diagnosis not present

## 2016-12-05 DIAGNOSIS — Z8249 Family history of ischemic heart disease and other diseases of the circulatory system: Secondary | ICD-10-CM | POA: Diagnosis not present

## 2016-12-05 DIAGNOSIS — E876 Hypokalemia: Secondary | ICD-10-CM | POA: Diagnosis not present

## 2016-12-05 DIAGNOSIS — R5381 Other malaise: Secondary | ICD-10-CM | POA: Diagnosis not present

## 2016-12-05 DIAGNOSIS — Z681 Body mass index (BMI) 19 or less, adult: Secondary | ICD-10-CM

## 2016-12-05 DIAGNOSIS — I10 Essential (primary) hypertension: Secondary | ICD-10-CM | POA: Diagnosis present

## 2016-12-05 DIAGNOSIS — J9601 Acute respiratory failure with hypoxia: Secondary | ICD-10-CM

## 2016-12-05 DIAGNOSIS — E43 Unspecified severe protein-calorie malnutrition: Secondary | ICD-10-CM | POA: Diagnosis present

## 2016-12-05 DIAGNOSIS — R0603 Acute respiratory distress: Secondary | ICD-10-CM

## 2016-12-05 DIAGNOSIS — R471 Dysarthria and anarthria: Secondary | ICD-10-CM | POA: Diagnosis present

## 2016-12-05 DIAGNOSIS — R338 Other retention of urine: Secondary | ICD-10-CM

## 2016-12-05 DIAGNOSIS — Y95 Nosocomial condition: Secondary | ICD-10-CM | POA: Diagnosis present

## 2016-12-05 DIAGNOSIS — R509 Fever, unspecified: Secondary | ICD-10-CM

## 2016-12-05 DIAGNOSIS — J9809 Other diseases of bronchus, not elsewhere classified: Secondary | ICD-10-CM | POA: Diagnosis not present

## 2016-12-05 DIAGNOSIS — G8250 Quadriplegia, unspecified: Secondary | ICD-10-CM | POA: Diagnosis present

## 2016-12-05 DIAGNOSIS — R4702 Dysphasia: Secondary | ICD-10-CM | POA: Diagnosis present

## 2016-12-05 DIAGNOSIS — J398 Other specified diseases of upper respiratory tract: Secondary | ICD-10-CM | POA: Diagnosis not present

## 2016-12-05 DIAGNOSIS — R0682 Tachypnea, not elsewhere classified: Secondary | ICD-10-CM | POA: Diagnosis not present

## 2016-12-05 DIAGNOSIS — R29898 Other symptoms and signs involving the musculoskeletal system: Secondary | ICD-10-CM | POA: Diagnosis not present

## 2016-12-05 DIAGNOSIS — G9349 Other encephalopathy: Secondary | ICD-10-CM | POA: Diagnosis present

## 2016-12-05 DIAGNOSIS — Z823 Family history of stroke: Secondary | ICD-10-CM | POA: Diagnosis not present

## 2016-12-05 DIAGNOSIS — K5900 Constipation, unspecified: Secondary | ICD-10-CM | POA: Diagnosis not present

## 2016-12-05 DIAGNOSIS — N179 Acute kidney failure, unspecified: Secondary | ICD-10-CM | POA: Diagnosis not present

## 2016-12-05 DIAGNOSIS — T17500A Unspecified foreign body in bronchus causing asphyxiation, initial encounter: Secondary | ICD-10-CM

## 2016-12-05 DIAGNOSIS — A4102 Sepsis due to Methicillin resistant Staphylococcus aureus: Secondary | ICD-10-CM | POA: Diagnosis present

## 2016-12-05 DIAGNOSIS — Z8042 Family history of malignant neoplasm of prostate: Secondary | ICD-10-CM

## 2016-12-05 DIAGNOSIS — E872 Acidosis: Secondary | ICD-10-CM | POA: Diagnosis present

## 2016-12-05 DIAGNOSIS — J9690 Respiratory failure, unspecified, unspecified whether with hypoxia or hypercapnia: Secondary | ICD-10-CM

## 2016-12-05 DIAGNOSIS — R339 Retention of urine, unspecified: Secondary | ICD-10-CM | POA: Diagnosis present

## 2016-12-05 DIAGNOSIS — J189 Pneumonia, unspecified organism: Secondary | ICD-10-CM

## 2016-12-05 DIAGNOSIS — B948 Sequelae of other specified infectious and parasitic diseases: Secondary | ICD-10-CM | POA: Diagnosis not present

## 2016-12-05 DIAGNOSIS — Z931 Gastrostomy status: Secondary | ICD-10-CM

## 2016-12-05 DIAGNOSIS — Z930 Tracheostomy status: Secondary | ICD-10-CM

## 2016-12-05 DIAGNOSIS — R0602 Shortness of breath: Secondary | ICD-10-CM | POA: Diagnosis present

## 2016-12-05 DIAGNOSIS — Z9911 Dependence on respirator [ventilator] status: Secondary | ICD-10-CM

## 2016-12-05 DIAGNOSIS — A852 Arthropod-borne viral encephalitis, unspecified: Secondary | ICD-10-CM | POA: Diagnosis present

## 2016-12-05 DIAGNOSIS — J9509 Other tracheostomy complication: Secondary | ICD-10-CM | POA: Diagnosis present

## 2016-12-05 DIAGNOSIS — T17890A Other foreign object in other parts of respiratory tract causing asphyxiation, initial encounter: Secondary | ICD-10-CM | POA: Diagnosis present

## 2016-12-05 DIAGNOSIS — R270 Ataxia, unspecified: Secondary | ICD-10-CM | POA: Diagnosis present

## 2016-12-05 DIAGNOSIS — L8990 Pressure ulcer of unspecified site, unspecified stage: Secondary | ICD-10-CM | POA: Diagnosis present

## 2016-12-05 LAB — PROCALCITONIN: Procalcitonin: 0.15 ng/mL

## 2016-12-05 LAB — COMPREHENSIVE METABOLIC PANEL
ALK PHOS: 130 U/L — AB (ref 38–126)
ALT: 225 U/L — AB (ref 14–54)
ALT: 196 U/L — AB (ref 14–54)
AST: 148 U/L — AB (ref 15–41)
AST: 117 U/L — ABNORMAL HIGH (ref 15–41)
Albumin: 2.9 g/dL — ABNORMAL LOW (ref 3.5–5.0)
Albumin: 2.6 g/dL — ABNORMAL LOW (ref 3.5–5.0)
Alkaline Phosphatase: 125 U/L (ref 38–126)
Anion gap: 11 (ref 5–15)
Anion gap: 9 (ref 5–15)
BILIRUBIN TOTAL: 0.8 mg/dL (ref 0.3–1.2)
BUN: 14 mg/dL (ref 6–20)
BUN: 12 mg/dL (ref 6–20)
CALCIUM: 8.5 mg/dL — AB (ref 8.9–10.3)
CHLORIDE: 91 mmol/L — AB (ref 101–111)
CHLORIDE: 95 mmol/L — AB (ref 101–111)
CO2: 30 mmol/L (ref 22–32)
CO2: 29 mmol/L (ref 22–32)
CREATININE: 0.32 mg/dL — AB (ref 0.44–1.00)
Calcium: 8.9 mg/dL (ref 8.9–10.3)
Glucose, Bld: 128 mg/dL — ABNORMAL HIGH (ref 65–99)
Glucose, Bld: 154 mg/dL — ABNORMAL HIGH (ref 65–99)
Potassium: 3.6 mmol/L (ref 3.5–5.1)
Potassium: 3.6 mmol/L (ref 3.5–5.1)
Sodium: 132 mmol/L — ABNORMAL LOW (ref 135–145)
Sodium: 133 mmol/L — ABNORMAL LOW (ref 135–145)
TOTAL PROTEIN: 6 g/dL — AB (ref 6.5–8.1)
Total Bilirubin: 0.7 mg/dL (ref 0.3–1.2)
Total Protein: 7 g/dL (ref 6.5–8.1)

## 2016-12-05 LAB — I-STAT ARTERIAL BLOOD GAS, ED
Acid-Base Excess: 8 mmol/L — ABNORMAL HIGH (ref 0.0–2.0)
Bicarbonate: 33.7 mmol/L — ABNORMAL HIGH (ref 20.0–28.0)
O2 Saturation: 90 %
PCO2 ART: 47.6 mmHg (ref 32.0–48.0)
PH ART: 7.458 — AB (ref 7.350–7.450)
TCO2: 35 mmol/L — AB (ref 22–32)
pO2, Arterial: 56 mmHg — ABNORMAL LOW (ref 83.0–108.0)

## 2016-12-05 LAB — PHOSPHORUS: PHOSPHORUS: 3 mg/dL (ref 2.5–4.6)

## 2016-12-05 LAB — CBC WITH DIFFERENTIAL/PLATELET
Basophils Absolute: 0 10*3/uL (ref 0.0–0.1)
Basophils Relative: 0 %
EOS ABS: 0 10*3/uL (ref 0.0–0.7)
EOS PCT: 0 %
HCT: 40.7 % (ref 36.0–46.0)
Hemoglobin: 13.3 g/dL (ref 12.0–15.0)
LYMPHS ABS: 0.4 10*3/uL — AB (ref 0.7–4.0)
Lymphocytes Relative: 3 %
MCH: 29.2 pg (ref 26.0–34.0)
MCHC: 32.7 g/dL (ref 30.0–36.0)
MCV: 89.3 fL (ref 78.0–100.0)
Monocytes Absolute: 1.4 10*3/uL — ABNORMAL HIGH (ref 0.1–1.0)
Monocytes Relative: 8 %
Neutro Abs: 14.8 10*3/uL — ABNORMAL HIGH (ref 1.7–7.7)
Neutrophils Relative %: 89 %
PLATELETS: 336 10*3/uL (ref 150–400)
RBC: 4.56 MIL/uL (ref 3.87–5.11)
RDW: 16.6 % — ABNORMAL HIGH (ref 11.5–15.5)
WBC: 16.6 10*3/uL — AB (ref 4.0–10.5)

## 2016-12-05 LAB — URINALYSIS, ROUTINE W REFLEX MICROSCOPIC
BACTERIA UA: NONE SEEN
Bilirubin Urine: NEGATIVE
Glucose, UA: NEGATIVE mg/dL
Hgb urine dipstick: NEGATIVE
KETONES UR: NEGATIVE mg/dL
Leukocytes, UA: NEGATIVE
Nitrite: NEGATIVE
PROTEIN: 30 mg/dL — AB
SQUAMOUS EPITHELIAL / LPF: NONE SEEN
Specific Gravity, Urine: 1.023 (ref 1.005–1.030)
pH: 7 (ref 5.0–8.0)

## 2016-12-05 LAB — LACTIC ACID, PLASMA
LACTIC ACID, VENOUS: 2.1 mmol/L — AB (ref 0.5–1.9)
Lactic Acid, Venous: 1.6 mmol/L (ref 0.5–1.9)

## 2016-12-05 LAB — CBC
HCT: 37.5 % (ref 36.0–46.0)
Hemoglobin: 12.2 g/dL (ref 12.0–15.0)
MCH: 29.2 pg (ref 26.0–34.0)
MCHC: 32.5 g/dL (ref 30.0–36.0)
MCV: 89.7 fL (ref 78.0–100.0)
PLATELETS: 293 10*3/uL (ref 150–400)
RBC: 4.18 MIL/uL (ref 3.87–5.11)
RDW: 16.7 % — ABNORMAL HIGH (ref 11.5–15.5)
WBC: 13.5 10*3/uL — ABNORMAL HIGH (ref 4.0–10.5)

## 2016-12-05 LAB — STREP PNEUMONIAE URINARY ANTIGEN: STREP PNEUMO URINARY ANTIGEN: NEGATIVE

## 2016-12-05 LAB — MAGNESIUM: MAGNESIUM: 1.7 mg/dL (ref 1.7–2.4)

## 2016-12-05 LAB — I-STAT CG4 LACTIC ACID, ED: LACTIC ACID, VENOUS: 1.86 mmol/L (ref 0.5–1.9)

## 2016-12-05 MED ORDER — GUAIFENESIN 100 MG/5ML PO SOLN
10.0000 mL | Freq: Four times a day (QID) | ORAL | Status: DC
  Administered 2016-12-05 – 2017-01-04 (×117): 200 mg
  Filled 2016-12-05 (×51): qty 10
  Filled 2016-12-05: qty 20
  Filled 2016-12-05 (×3): qty 10
  Filled 2016-12-05: qty 20
  Filled 2016-12-05 (×34): qty 10
  Filled 2016-12-05: qty 50
  Filled 2016-12-05 (×28): qty 10

## 2016-12-05 MED ORDER — FREE WATER
200.0000 mL | Freq: Three times a day (TID) | Status: DC
  Administered 2016-12-05 – 2016-12-07 (×5): 200 mL

## 2016-12-05 MED ORDER — PRO-STAT SUGAR FREE PO LIQD
30.0000 mL | Freq: Two times a day (BID) | ORAL | Status: DC
  Administered 2016-12-05 – 2017-01-04 (×60): 30 mL
  Filled 2016-12-05 (×61): qty 30

## 2016-12-05 MED ORDER — SACCHAROMYCES BOULARDII 250 MG PO CAPS
250.0000 mg | ORAL_CAPSULE | Freq: Two times a day (BID) | ORAL | Status: DC
  Administered 2016-12-05 – 2017-01-04 (×59): 250 mg
  Filled 2016-12-05 (×59): qty 1

## 2016-12-05 MED ORDER — ALBUTEROL SULFATE (2.5 MG/3ML) 0.083% IN NEBU: 2.5000 mg | INHALATION_SOLUTION | Freq: Four times a day (QID) | RESPIRATORY_TRACT | Status: DC | PRN

## 2016-12-05 MED ORDER — BACLOFEN 10 MG PO TABS
5.0000 mg | ORAL_TABLET | Freq: Four times a day (QID) | ORAL | Status: DC
  Administered 2016-12-05 – 2017-01-04 (×116): 5 mg
  Filled 2016-12-05 (×120): qty 1

## 2016-12-05 MED ORDER — SODIUM CHLORIDE 0.9 % IV SOLN: 250.0000 mL | INTRAVENOUS | Status: DC | PRN

## 2016-12-05 MED ORDER — ORAL CARE MOUTH RINSE
15.0000 mL | Freq: Two times a day (BID) | OROMUCOSAL | Status: DC
  Administered 2016-12-06: 15 mL via OROMUCOSAL

## 2016-12-05 MED ORDER — PRIMIDONE 50 MG PO TABS
50.0000 mg | ORAL_TABLET | Freq: Four times a day (QID) | ORAL | Status: DC
  Administered 2016-12-05 – 2017-01-04 (×115): 50 mg
  Filled 2016-12-05 (×126): qty 1

## 2016-12-05 MED ORDER — OSMOLITE 1.5 CAL PO LIQD
1000.0000 mL | ORAL | Status: DC
  Administered 2016-12-05: 1000 mL
  Filled 2016-12-05 (×3): qty 1000

## 2016-12-05 MED ORDER — ALBUTEROL SULFATE (2.5 MG/3ML) 0.083% IN NEBU
2.5000 mg | INHALATION_SOLUTION | RESPIRATORY_TRACT | Status: DC
  Administered 2016-12-05 – 2016-12-11 (×35): 2.5 mg via RESPIRATORY_TRACT
  Filled 2016-12-05 (×34): qty 3

## 2016-12-05 MED ORDER — MELATONIN 3 MG PO TABS
6.0000 mg | ORAL_TABLET | Freq: Every evening | ORAL | Status: DC
  Administered 2016-12-05 – 2017-01-03 (×29): 6 mg
  Filled 2016-12-05 (×33): qty 2

## 2016-12-05 MED ORDER — ATORVASTATIN CALCIUM 20 MG PO TABS
20.0000 mg | ORAL_TABLET | Freq: Every day | ORAL | Status: DC
  Administered 2016-12-05 – 2017-01-03 (×30): 20 mg
  Filled 2016-12-05 (×31): qty 1

## 2016-12-05 MED ORDER — VANCOMYCIN HCL IN DEXTROSE 1-5 GM/200ML-% IV SOLN
1000.0000 mg | Freq: Once | INTRAVENOUS | Status: AC
  Administered 2016-12-05: 1000 mg via INTRAVENOUS
  Filled 2016-12-05: qty 200

## 2016-12-05 MED ORDER — DEXTROSE 5 % IV SOLN
2.0000 g | Freq: Once | INTRAVENOUS | Status: DC
  Administered 2016-12-05: 2 g via INTRAVENOUS
  Filled 2016-12-05: qty 2

## 2016-12-05 MED ORDER — HEPARIN SODIUM (PORCINE) 5000 UNIT/ML IJ SOLN
5000.0000 [IU] | Freq: Three times a day (TID) | INTRAMUSCULAR | Status: DC
  Administered 2016-12-05 – 2017-01-04 (×89): 5000 [IU] via SUBCUTANEOUS
  Filled 2016-12-05 (×91): qty 1

## 2016-12-05 MED ORDER — SODIUM CHLORIDE 3 % IN NEBU
4.0000 mL | INHALATION_SOLUTION | Freq: Every day | RESPIRATORY_TRACT | Status: AC
  Administered 2016-12-06 – 2016-12-08 (×3): 4 mL via RESPIRATORY_TRACT
  Filled 2016-12-05 (×3): qty 4

## 2016-12-05 MED ORDER — AMANTADINE HCL 50 MG/5ML PO SYRP
150.0000 mg | ORAL_SOLUTION | Freq: Two times a day (BID) | ORAL | Status: DC
  Administered 2016-12-05 – 2016-12-12 (×13): 150 mg
  Filled 2016-12-05 (×14): qty 15

## 2016-12-05 MED ORDER — SODIUM CHLORIDE 0.9 % IV SOLN
Freq: Once | INTRAVENOUS | Status: AC
  Administered 2016-12-05: 18:00:00 via INTRAVENOUS

## 2016-12-05 MED ORDER — VANCOMYCIN HCL IN DEXTROSE 1-5 GM/200ML-% IV SOLN: 1000.0000 mg | Freq: Once | INTRAVENOUS | Status: DC

## 2016-12-05 MED ORDER — SODIUM CHLORIDE 0.9 % IV SOLN
1.0000 g | Freq: Three times a day (TID) | INTRAVENOUS | Status: DC
  Administered 2016-12-06 – 2016-12-07 (×5): 1 g via INTRAVENOUS
  Filled 2016-12-05 (×6): qty 1

## 2016-12-05 MED ORDER — VANCOMYCIN HCL 500 MG IV SOLR
500.0000 mg | Freq: Three times a day (TID) | INTRAVENOUS | Status: DC
  Administered 2016-12-06 – 2016-12-09 (×11): 500 mg via INTRAVENOUS
  Filled 2016-12-05 (×11): qty 500

## 2016-12-05 MED ORDER — DEXTROSE IN LACTATED RINGERS 5 % IV SOLN
INTRAVENOUS | Status: DC
  Administered 2016-12-05 – 2016-12-07 (×4): via INTRAVENOUS

## 2016-12-05 MED ORDER — IPRATROPIUM-ALBUTEROL 0.5-2.5 (3) MG/3ML IN SOLN
3.0000 mL | Freq: Once | RESPIRATORY_TRACT | Status: AC
  Administered 2016-12-05: 3 mL via RESPIRATORY_TRACT
  Filled 2016-12-05: qty 3

## 2016-12-05 MED ORDER — CHLORHEXIDINE GLUCONATE 0.12 % MT SOLN
15.0000 mL | Freq: Two times a day (BID) | OROMUCOSAL | Status: DC
  Administered 2016-12-05 – 2016-12-06 (×2): 15 mL via OROMUCOSAL

## 2016-12-05 NOTE — ED Triage Notes (Signed)
Patient arrived by WC \\and  trach. Spouse reports that sats have been running low with increased congestion x 1 day. Started antibiotic today, spouse reports increased secretions with suctioning, skin feels warm to touch

## 2016-12-05 NOTE — ED Provider Notes (Addendum)
[facility]ERGENCY DEPARTMENT Provider Note   CSN: [id number] Arrival date & time: 12/05/16  1528     History   Chief Complaint Chief Complaint  Patient presents with  . low sats/congestion    HPI Judy Spears is a 66 y.o. female.  65 year old female presents with respiratory distress. PMHx significant for spastic tetraplegia following viral encephalitis, breast cancer, tracheostomy dependent, PEG placement.  She presents with her husband today for evaluation of increased secretions and decreased O2 Sats. Husband reports that last suction was about 2 hours prior to arrival in the ED. Initial sats on RA noted to be in the mid 70's. Husband reports that she received one dose of levaquin this morning.    The history is provided by a relative. The history is limited by the condition of the patient.  Shortness of Breath  This is a recurrent problem. The average episode lasts 3 days. The problem occurs intermittently.The current episode started more than 2 days ago. The problem has been gradually worsening. Risk factors: sp trach/peg. She has tried beta-agonist inhalers for the symptoms. She has had prior hospitalizations. She has had prior ED visits. She has had prior ICU admissions.    Past Medical History:  Diagnosis Date  . Arthritis    lt great toe  . Complication of anesthesia    pt has had headaches post op-did advise to hydra well preop  . Hair loss    right-sided  . History of exercise stress test    a. ETT (12/15) with 12:00 exercise, no ST changes, occasional PACs.   . Hyperlipidemia   . Insomnia   . Migraine headache   . Movement disorder    resltess in left legs  . Postmenopausal   . 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 Premature atrial contractions    a. Holter (12/15) with 8% PACs, no atrial runs or atrial fibrillation.     Patient Active Problem List   Diagnosis Date Noted  . Pseudomonas urinary tract infection 10/27/2016  . Severe  muscle deconditioning 09/30/2016  . Pressure injury of skin 09/28/2016  . Respiratory failure ([place])   . Tracheobronchitis   . Urinary retention   . PEG (percutaneous endoscopic gastrostomy) status ([place])   . Benign essential HTN   . Hyponatremia   . Hypokalemia   . Reactive hypertension   . Respiratory insufficiency 09/24/2016  . Malignant neoplasm of upper-inner quadrant of left breast in female, estrogen receptor positive ([place]) 07/14/2016  . Heterotopic ossification of bone 03/10/2016  . Acute on chronic respiratory failure with hypoxia ([place])   . Acute hypoxemic respiratory failure ([place])   . Acute blood loss anemia   . Atelectasis 02/04/2016  . Debility 02/03/2016  . Pneumonia of both lower lobes due to Pseudomonas species ([place])   . Pneumonia of both lower lobes due to methicillin susceptible Staphylococcus aureus (MSSA) ([place])   . Acute respiratory failure with hypoxia ([place])   . Acute encephalopathy   . Seizures ([place])   . Sepsis ([place])   . Hypoxia   . Pain   . HCAP (healthcare-associated pneumonia) 12/28/2015  . Chronic respiratory failure ([place]) 12/28/2015  . Acute tracheobronchitis 12/24/2015  . Tracheostomy tube present ([place])   . Tracheal stenosis   . Chronic respiratory failure with hypoxia ([place])   . Spastic tetraplegia ([place]) 10/20/2015  . Tracheostomy status ([place]) 09/26/2015  . Allergic rhinitis 09/26/2015  . 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 Movement disorder 09/22/2015  .  Encephalitis 09/22/2015  . Chest pain 02/07/2014  . Premature atrial contractions 01/13/2014  . Abnormality of gait 12/04/2013  . Hyperlipidemia 12/21/2011  . Cardiovascular risk factor 12/21/2011  . Bunion 01/31/2008  . Metatarsalgia of both feet 09/20/2007  . FLAT FOOT 09/20/2007  . HALLUX RIGIDUS, ACQUIRED 09/20/2007    Past Surgical History:  Procedure Laterality Date  . BREAST BIOPSY Right 07/27/2012  . BREAST BIOPSY Right 07/07/2016  . BREAST BIOPSY Left 06/30/2016  .  cataracts right eye    . CESAREAN SECTION  1986  . HERNIA REPAIR  2000   RIH  . IR [place] TUBE PERCUT W/FLUORO  09/29/2016  . opirectinal membrane peel    . PEG PLACEMENT    . RHINOPLASTY  1983  . TRACHEOSTOMY      OB History    No data available       Home Medications    Prior to Admission medications   Medication Sig Start Date End Date Taking? Authorizing Provider  albuterol (PROVENTIL) (2.5 MG/3ML) 0.083% nebulizer solution Take 3 mLs (2.5 mg total) by nebulization every 6 (six) hours as needed for wheezing or shortness of breath. 11/24/16   Juanito Doom, MD  amantadine (SYMMETREL) 50 MG/5ML solution PLACE 51mL INTO FEEDING TUBE TWICE DAILY 11/25/16   Meredith Staggers, MD  Amino Acids-Protein Hydrolys (FEEDING SUPPLEMENT, PRO-STAT SUGAR FREE 64,) LIQD Place 30 mLs into feeding tube 2 (two) times daily. 10/27/16   Love, Ivan Anchors, PA-C  amLODipine (NORVASC) 10 MG tablet Place 1 tablet (10 mg total) into feeding tube daily. 10/27/16 10/27/17  Love, Ivan Anchors, PA-C  atorvastatin (LIPITOR) 20 MG tablet Place 20 mg into feeding tube at bedtime.     [provider]  baclofen (LIORESAL) 10 MG tablet PLACE 1 TABLET INTO FEEDING TUBE EVERY 6HOURS 12/02/16   Meredith Staggers, MD  bethanechol (URECHOLINE) 25 MG tablet Place 1 tablet (25 mg total) into feeding tube 2 (two) times daily. FOR 2 WEEKS, THEN DECREASE TO DAILY FOR 2 WEEKS THEN STOP 11/25/16   Meredith Staggers, MD  Bethanechol Chloride (URECHOLINE PO) Take by mouth. BID- currently weaning off of    [provider]  chlorhexidine (PERIDEX) 0.12 % solution 52ml IN THE MOUTH OR THROAT TWICE DAILY 12/02/16   Meredith Staggers, MD  clotrimazole (LOTRIMIN) 1 % cream Apply topically 2 (two) times daily. 10/27/16   Love, Ivan Anchors, PA-C  docusate sodium (COLACE) 100 MG capsule Take 100 mg by mouth 2 (two) times daily.    [provider]  gabapentin (NEURONTIN) 100 MG capsule One capsule--open and  administer via PEG twice a day. 10/27/16   Love, Ivan Anchors, PA-C  guaiFENesin (ROBITUSSIN) 100 MG/5ML SOLN Place 10 mLs (200 mg total) into feeding tube 4 (four) times daily. Patient taking differently: Place 10 mLs into feeding tube as needed for to loosen phlegm.  02/25/16   Love, Ivan Anchors, PA-C  loratadine (CLARITIN) 10 MG tablet Place 1 tablet (10 mg total) into feeding tube daily as needed for allergies. Patient taking differently: Place 10 mg into feeding tube every morning. daily 02/25/16   Love, Ivan Anchors, PA-C  Melatonin 5 MG CAPS Place 5 mg into feeding tube every evening.     [provider]  Multiple Vitamin (MULTIVITAMIN) LIQD Place 15 mLs into feeding tube daily. 05/11/16   Donita Brooks, NP  Nutritional Supplements (FEEDING SUPPLEMENT, OSMOLITE 1.5 CAL,) LIQD Place 1,000 mLs into feeding tube daily. 09/30/16  Patrecia Pour, Christean Grief, MD  potassium chloride 20 MEQ/15ML (10%) SOLN Place 15 mLs (20 mEq total) into feeding tube daily. 05/05/16   Meredith Staggers, MD  primidone (MYSOLINE) 50 MG tablet Place 1 tablet (50 mg total) into feeding tube 4 (four) times daily. 10/27/16   Love, Ivan Anchors, PA-C  saccharomyces boulardii (FLORASTOR) 250 MG capsule Place 1 capsule (250 mg total) into feeding tube 2 (two) times daily. 10/27/16   Love, Ivan Anchors, PA-C  sodium chloride HYPERTONIC 3 % nebulizer solution Take by nebulization 2 (two) times daily as needed for other. 11/24/16   Juanito Doom, MD  valACYclovir (VALTREX) 500 MG tablet Take 500 mg by mouth daily.    [provider]  Water For Irrigation, Sterile (FREE WATER) SOLN Place 200 mLs into feeding tube every 8 (eight) hours. 10/27/16   Bary Leriche, PA-C    Family History Family History  Problem Relation Age of Onset  . Stroke Mother        paralyzed on right side/aphasia  . Hypertension Mother        had stroke after stopping BP meds  . Colon cancer Father   . Prostate cancer Father   . Heart attack Maternal  Grandfather 56  . Stroke Maternal Uncle 8    Social History Social History   Tobacco Use  . Smoking status: Never Smoker  . Smokeless tobacco: Never Used  Substance Use Topics  . Alcohol use: Yes    Alcohol/week: 0.0 oz    Comment: Occasional.  . Drug use: No     Allergies   Cefepime; Tetracyclines & related; Doxycycline monohydrate; Pollen extract; and Tetracycline   Review of Systems Review of Systems  Unable to perform ROS: Patient nonverbal  Respiratory: Positive for shortness of breath.      Physical Exam Updated Vital Signs BP 123/84   Pulse 99   Temp 98.8 F (37.1 C)   Resp (!) 34   LMP 12/01/2010   SpO2 95%   Physical Exam  Constitutional: She appears distressed.  Spastic tetraplegia Mild respiratory distress   HENT:  Head: Normocephalic and atraumatic.  Mouth/Throat: Oropharynx is clear and moist.  Eyes: Conjunctivae are normal.  Neck: Neck supple.  Cardiovascular: Normal rate, regular rhythm and normal heart sounds.  No murmur heard. Pulmonary/Chest: Breath sounds normal. No stridor. She is in respiratory distress. She has no wheezes.  Moderate resp distress Ronchi noted to bilateral bases   Abdominal: Soft. She exhibits no distension. There is no tenderness.  Musculoskeletal: Normal range of motion. She exhibits no edema.  Neurological: She is alert.  Tetraplegic   Skin: Skin is warm and dry.  Psychiatric: She has a normal mood and affect.  Nursing note and vitals reviewed.    ED Treatments / Results  Labs (all labs ordered are listed, but only abnormal results are displayed) Labs Reviewed  I-STAT ARTERIAL BLOOD GAS, ED - Abnormal; Notable for the following components:      Result Value   pH, Arterial 7.458 (*)    pO2, Arterial 56.0 (*)    Bicarbonate 33.7 (*)    TCO2 35 (*)    Acid-Base Excess 8.0 (*)    All other components within normal limits  CULTURE, EXPECTORATED SPUTUM-ASSESSMENT  CULTURE, BLOOD (ROUTINE X 2)  CULTURE,  BLOOD (ROUTINE X 2)  GRAM STAIN  COMPREHENSIVE METABOLIC PANEL  CBC WITH DIFFERENTIAL/PLATELET  URINALYSIS, ROUTINE W REFLEX MICROSCOPIC  BLOOD GAS, ARTERIAL  STREP PNEUMONIAE URINARY ANTIGEN  TROPONIN I  LACTIC ACID, PLASMA  LACTIC ACID, PLASMA  I-STAT CG4 LACTIC ACID, ED    EKG  EKG Interpretation  Date/Time:  Sunday December 05 2016 16:29:10 EST Ventricular Rate:  93 PR Interval:    QRS Duration: 88 QT Interval:  405 QTC Calculation: 504 R Axis:   58 Text Interpretation:  Sinus rhythm Ventricular premature complex Short PR interval Probable left atrial enlargement Left ventricular hypertrophy Anterior Q waves, possibly due to LVH Prolonged QT interval Baseline wander in lead(s) V2 V4 V5 Confirmed by Dene Gentry [phone]) on 12/05/2016 4:53:55 PM       Radiology Dg Chest Port 1 View  Result Date: 12/05/2016 CLINICAL DATA:  66 y/o  F; shortness of breath. EXAM: PORTABLE CHEST 1 VIEW COMPARISON:  11/11/2016 chest radiograph FINDINGS: Stable cardiac silhouette. Tracheostomy tube in situ. Interim development of patchy opacities within the lung bases bilaterally and diffuse pulmonary reticular opacities. No pleural effusion or pneumothorax. Bones are unremarkable. IMPRESSION: Reticular opacities, probably mild interstitial edema. Patchy opacities in lung bases may represent alveolar edema or pneumonia. Electronically Signed   By: Kristine Garbe M.D.   On: 12/05/2016 16:40    Procedures Procedures (including critical care time)   CRITICAL CARE Performed by: Valarie Merino   Total critical care time: 30 minutes  Critical care time was exclusive of separately billable procedures and treating other patients.  Critical care was necessary to treat or prevent imminent or life-threatening deterioration.  Critical care was time spent personally by me on the following activities: development of treatment plan with patient and/or surrogate as well as nursing, discussions  with consultants, evaluation of patient's response to treatment, examination of patient, obtaining history from patient or surrogate, ordering and performing treatments and interventions, ordering and review of laboratory studies, ordering and review of radiographic studies, pulse oximetry and re-evaluation of patient's condition.  Medications Ordered in ED Medications  aztreonam (AZACTAM) 2 g in dextrose 5 % 50 mL IVPB (not administered)  0.9 %  sodium chloride infusion (not administered)  ipratropium-albuterol (DUONEB) 0.5-2.5 (3) MG/3ML nebulizer solution 3 mL (3 mLs Nebulization Given 12/05/16 1629)     Initial Impression / Assessment and Plan / ED Course  I have reviewed the triage vital signs and the nursing notes.  Pertinent labs & imaging results that were available during my care of the patient were reviewed by me and considered in my medical decision making (see chart for details).   43 Dr. Tera Partridge (Critical Care) aware of case and will evaluate in the ED.     MSE complete  Patient with significant co-morbities presents with increased sputum production, elevated WBC, and hypoxia. Suspect early pneumonia and will treat accordingly. Critical Care has evaluated the patient and will admit for further workup and treatment.   Final Clinical Impressions(s) / ED Diagnoses   Final diagnoses:  Shortness of breath    ED Discharge Orders    None       Valarie Merino, MD 12/05/16 1748    Valarie Merino, MD 12/05/16 [phone]

## 2016-12-05 NOTE — H&P (Signed)
PULMONARY / CRITICAL CARE MEDICINE   Name: Judy Spears MRN: [id number] DOB: 1950-12-10    ADMISSION DATE:  12/05/2016 CONSULTATION DATE:  11/11  REFERRING MD:  Francia Greaves  CHIEF COMPLAINT:  Pneumonia and respiratory failure  HISTORY OF PRESENT ILLNESS:   66 year old female well-known to the pulmonary service followed by Dr. Lake Bells chronic respiratory failure.  She is been tracheostomy dependent since 2016 after being bit by a tick and developing the rare tickborne illness [place] Virus infection with resultant encephalitis.  Since then she has been in and out of the hospital for recurrent pulmonary infections.  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  Her health has been further complicated by a recent diagnosis of breast cancer she underwent a lumpectomy in the summer 2018.  She still lives at home with her family on a good day she is interactive, nods, make small attempts to verbalize.  He did more recently she is been found to have a broken right hip she is wheelchair-bound at baseline so this is been undiagnosed up until recent attempts for lumbar puncture resulted in significant discomfort.  She presents to the emergency room today with a 1 day history of increasing cough with purulent sputum, increased work of breathing, decreased mental status, and increased restlessness.  Her oxygen saturations had dropped into the 70s at home and therefore her husband brought her to the emergency room for evaluation.  In the emergency room she was found to have leukocytosis as well as bibasilar pulmonary infiltrates on chest x-ray will admit her to the intensive care with a working diagnosis of pneumonia and respiratory failure.  PAST MEDICAL HISTORY :  She  has a past medical history of Arthritis, Complication of anesthesia, Hair loss,  History of exercise stress test, Hyperlipidemia, Insomnia, Migraine headache, Movement disorder, Postmenopausal, [place] encephalitis, and Premature atrial contractions.  PAST SURGICAL HISTORY: She  has a past surgical history that includes Hernia repair (2000); Rhinoplasty (1983); Cesarean section (1986); opirectinal membrane peel; cataracts right eye; Breast biopsy (Right, 07/27/2012); Breast biopsy (Right, 07/07/2016); Breast biopsy (Left, 06/30/2016); Tracheostomy; PEG placement; IR Replc Gastro/Colonic Tube Percut W/Fluoro (09/29/2016); and BREAST BIOPSY WITH NEEDLE LOCALIZATION (Left, 01/01/2011).  Allergies  Allergen Reactions  . Cefepime Other (See Comments)    Status epilepticus- this reaction is questionable per husband.States that it was determined that pt did not have seizure activity. Status epilepticus  . Tetracyclines & Related Photosensitivity and Other (See Comments)    Blisters  . Doxycycline Monohydrate     Blisters, photosensitivity- family state that the reaction is actually to tetracycline.  . Pollen Extract Other (See Comments)    Running nose  . Tetracycline Other (See Comments) and Photosensitivity    Blisters as a child.    No current facility-administered medications on file prior to encounter.    Current Outpatient Medications on File Prior to Encounter  Medication Sig  . albuterol (PROVENTIL) (2.5 MG/3ML) 0.083% nebulizer solution Take 3 mLs (2.5 mg total) by nebulization every 6 (six) hours as needed for wheezing or shortness of breath.  [name] amantadine (SYMMETREL) 50 MG/5ML solution PLACE 77mL INTO FEEDING TUBE TWICE DAILY  . Amino Acids-Protein Hydrolys (FEEDING SUPPLEMENT, PRO-STAT SUGAR FREE 64,) LIQD Place 30 mLs into feeding tube 2 (two) times daily.  [name] amLODipine (NORVASC) 10 MG tablet Place 1 tablet (10 mg total) into feeding tube daily.  [name]  atorvastatin (LIPITOR) 20 MG tablet Place 20 mg into feeding tube at bedtime.   . baclofen (LIORESAL) 10 MG tablet  PLACE 1 TABLET INTO FEEDING TUBE EVERY 6HOURS  . bethanechol (URECHOLINE) 25 MG tablet Place 1 tablet (25 mg total) into feeding tube 2 (two) times daily. FOR 2 WEEKS, THEN DECREASE TO DAILY FOR 2 WEEKS THEN STOP  . Bethanechol Chloride (URECHOLINE PO) Take by mouth. BID- currently weaning off of  . chlorhexidine (PERIDEX) 0.12 % solution 50ml IN THE MOUTH OR THROAT TWICE DAILY  . clotrimazole (LOTRIMIN) 1 % cream Apply topically 2 (two) times daily.  [name] docusate sodium (COLACE) 100 MG capsule Take 100 mg by mouth 2 (two) times daily.  [name] gabapentin (NEURONTIN) 100 MG capsule One capsule--open and administer via PEG twice a day.  [name] guaiFENesin (ROBITUSSIN) 100 MG/5ML SOLN Place 10 mLs (200 mg total) into feeding tube 4 (four) times daily. (Patient taking differently: Place 10 mLs into feeding tube as needed for to loosen phlegm. )  . loratadine (CLARITIN) 10 MG tablet Place 1 tablet (10 mg total) into feeding tube daily as needed for allergies. (Patient taking differently: Place 10 mg into feeding tube every morning. daily)  . Melatonin 5 MG CAPS Place 5 mg into feeding tube every evening.   . Multiple Vitamin (MULTIVITAMIN) LIQD Place 15 mLs into feeding tube daily.  . Nutritional Supplements (FEEDING SUPPLEMENT, OSMOLITE 1.5 CAL,) LIQD Place 1,000 mLs into feeding tube daily.  . potassium chloride 20 MEQ/15ML (10%) SOLN Place 15 mLs (20 mEq total) into feeding tube daily.  . primidone (MYSOLINE) 50 MG tablet Place 1 tablet (50 mg total) into feeding tube 4 (four) times daily.  [name] saccharomyces boulardii (FLORASTOR) 250 MG capsule Place 1 capsule (250 mg total) into feeding tube 2 (two) times daily.  . sodium chloride HYPERTONIC 3 % nebulizer solution Take by nebulization 2 (two) times daily as needed for other.  . valACYclovir (VALTREX) 500 MG tablet Take 500 mg by mouth daily.  . Water For Irrigation, Sterile (FREE WATER) SOLN Place 200 mLs into feeding tube every 8 (eight) hours.    FAMILY  HISTORY:  Her indicated that her mother is deceased. She indicated that her father is alive. She indicated that both of her brothers are alive. She indicated that the status of her maternal grandfather is unknown. She indicated that both of her daughters are alive. She indicated that her son is alive. She indicated that the status of her maternal uncle is unknown.   SOCIAL HISTORY: She  reports that  has never smoked. she has never used smokeless tobacco. She reports that she drinks alcohol. She reports that she does not use drugs.  REVIEW OF SYSTEMS:   Unable  SUBJECTIVE:  Restless tachypneic appears uncomfortable  VITAL SIGNS: BP 123/84   Pulse 99   Temp 98.8 F (37.1 C)   Resp (!) 34   Wt 98 lb (44.5 kg)   LMP 12/01/2010   SpO2 95%   BMI 17.92 kg/m   HEMODYNAMICS:    VENTILATOR SETTINGS:    INTAKE / OUTPUT: No intake/output data recorded.  PHYSICAL EXAMINATION: General: Chronically ill appearing frail 66 year old female currently  with positive accessory muscle use.  She is toxic appearing awake Neuro:   will track intermittently, has constant pill-rolling the left greater than right hand HEENT: Cuffless trach is unremarkable she does have purulent yellow sputum Cardiovascular: Tachycardic regular rhythm Lungs: Diffuse rhonchi tachypneic increased respiratory accessory use Abdomen: PEG in place  abdomen soft no tenderness Musculoskeletal: Equal strength and bulk Skin: Warm and diaphoretic  LABS:  BMET No results for input(s): NA, K, CL, CO2, BUN, CREATININE, GLUCOSE in the last 168 hours.  Electrolytes No results for input(s): CALCIUM, MG, PHOS in the last 168 hours.  CBC Recent Labs  Lab 12/05/16 1559  WBC 16.6*  HGB 13.3  HCT 40.7  PLT 336    Coag's No results for input(s): APTT, INR in the last 168 hours.  Sepsis Markers Recent Labs  Lab 12/05/16 1616 12/05/16 1647  LATICACIDVEN 1.86 1.6    ABG Recent Labs  Lab 12/05/16 1624  PHART  7.458*  PCO2ART 47.6  PO2ART 56.0*    Liver Enzymes No results for input(s): AST, ALT, ALKPHOS, BILITOT, ALBUMIN in the last 168 hours.  Cardiac Enzymes No results for input(s): TROPONINI, PROBNP in the last 168 hours.  Glucose No results for input(s): GLUCAP in the last 168 hours.  Imaging Dg Chest Port 1 View  Result Date: 12/05/2016 CLINICAL DATA:  66 y/o  F; shortness of breath. EXAM: PORTABLE CHEST 1 VIEW COMPARISON:  11/11/2016 chest radiograph FINDINGS: Stable cardiac silhouette. Tracheostomy tube in situ. Interim development of patchy opacities within the lung bases bilaterally and diffuse pulmonary reticular opacities. No pleural effusion or pneumothorax. Bones are unremarkable. IMPRESSION: Reticular opacities, probably mild interstitial edema. Patchy opacities in lung bases may represent alveolar edema or pneumonia. Electronically Signed   By: Kristine Garbe M.D.   On: 12/05/2016 16:40     STUDIES:    CULTURES: Sputum culture 11/11 Respiratory culture 11/11  ANTIBIOTICS: Meropenem 11/11 Vancomycin 11/11  SIGNIFICANT EVENTS:   LINES/TUBES:   DISCUSSION: Admit to ICU for pneumonia high risk for recurrent respiratory failure  ASSESSMENT / PLAN:  Acute hypoxic respiratory failure in the setting of bibasilar pneumonia Chronic tracheostomy Chronic respiratory failure in setting of neuromuscular weakness Poor cough mechanics  she just left the hospital approximately 1 month ago so we will treat this is healthcare associated P:   Admit to the intensive care Supplemental oxygen via trach collar Scheduled bronchodilators Chest vest Sputum culture Empiric vancomycin and meropenem, narrow once culture data becomes available   Severe sepsis P:  Admit to the intensive care IV hydration Repeat lactic acid Treat infection  Dysphasia, PEG dependent. 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 Not surgical candidate   FAMILY  - Updates: Updated husband at bedside  - Inter-disciplinary family meet or Palliative Care meeting due by: 11/19  Erick Colace ACNP-BC [place] Pager # [phone] OR # [phone] if no answer    12/05/2016, 5:37 PM

## 2016-12-05 NOTE — Progress Notes (Signed)
Pharmacy Antibiotic Note  Judy Spears Tammra Pressman is a 66 y.o. female admitted on 12/05/2016 with pneumonia.  Pharmacy has been consulted for meropenem and vancomycin dosing.  She received loading dose of vancomycin and one dose of aztreonam in ED today. Has listed allergy to cefepime; however, has tolerated other cephalosporins in past. WBC 16.6 today. Given low body weight, Scr came back undetectable. Was on levaquin prior to admission per notes.   Plan: Meropenem 1 gram every 8 hours  Vancomycin 500 mg IV every 8 hours Monitor renal function, cx results, clinical picture, and VT as needed  Weight: 98 lb (44.5 kg)  Temp (24hrs), Avg:98.8 F (37.1 C), Min:98.8 F (37.1 C), Max:98.8 F (37.1 C)  Recent Labs  Lab 12/05/16 1559 12/05/16 1616 12/05/16 1647  WBC 16.6*  --   --   CREATININE <0.30*  --   --   LATICACIDVEN  --  1.86 1.6    CrCl cannot be calculated (This lab value cannot be used to calculate CrCl because it is not a number: <0.30).    Allergies  Allergen Reactions  . Cefepime Other (See Comments)    Status epilepticus- this reaction is questionable per husband.States that it was determined that pt did not have seizure activity. Status epilepticus  . Tetracyclines & Related Photosensitivity and Other (See Comments)    Blisters (childhood allergy)  . Doxycycline Monohydrate Other (See Comments)    Blisters, photosensitivity- family state that the reaction is actually to tetracycline.  . Pollen Extract Other (See Comments)    Running nose (seasonal allergies)    Antimicrobials this admission: Aztreonam x1 Vancomycin 11/11 >>  Meropenem 11/11 >>   Dose adjustments this admission: N/A  Microbiology results: 11/11 BCx: sent 11/11 Sputum: sent   Thank you for allowing pharmacy to be a part of this patient's care.  Doylene Canard, PharmD Clinical Pharmacist  Pager: [phone] Phone: [phone] 12/05/2016 6:10 PM

## 2016-12-05 NOTE — Progress Notes (Signed)
#  4 cuffed trach placed at bedside. RN aware.

## 2016-12-06 ENCOUNTER — Inpatient Hospital Stay (HOSPITAL_COMMUNITY): Payer: Medicare Other

## 2016-12-06 ENCOUNTER — Ambulatory Visit: Payer: Medicare Other | Admitting: Physical Therapy

## 2016-12-06 ENCOUNTER — Ambulatory Visit: Payer: Medicare Other | Admitting: Speech Pathology

## 2016-12-06 ENCOUNTER — Ambulatory Visit: Payer: Medicare Other | Admitting: Occupational Therapy

## 2016-12-06 DIAGNOSIS — J9601 Acute respiratory failure with hypoxia: Secondary | ICD-10-CM

## 2016-12-06 DIAGNOSIS — R0603 Acute respiratory distress: Secondary | ICD-10-CM

## 2016-12-06 LAB — BASIC METABOLIC PANEL
Anion gap: 9 (ref 5–15)
BUN: 11 mg/dL (ref 6–20)
CALCIUM: 8.3 mg/dL — AB (ref 8.9–10.3)
CO2: 31 mmol/L (ref 22–32)
Chloride: 97 mmol/L — ABNORMAL LOW (ref 101–111)
Glucose, Bld: 126 mg/dL — ABNORMAL HIGH (ref 65–99)
Potassium: 3.4 mmol/L — ABNORMAL LOW (ref 3.5–5.1)
SODIUM: 137 mmol/L (ref 135–145)

## 2016-12-06 LAB — CBC
HCT: 36.2 % (ref 36.0–46.0)
Hemoglobin: 11.6 g/dL — ABNORMAL LOW (ref 12.0–15.0)
MCH: 28.7 pg (ref 26.0–34.0)
MCHC: 32 g/dL (ref 30.0–36.0)
MCV: 89.6 fL (ref 78.0–100.0)
PLATELETS: 273 10*3/uL (ref 150–400)
RBC: 4.04 MIL/uL (ref 3.87–5.11)
RDW: 16.3 % — AB (ref 11.5–15.5)
WBC: 10.7 10*3/uL — ABNORMAL HIGH (ref 4.0–10.5)

## 2016-12-06 LAB — LACTIC ACID, PLASMA
LACTIC ACID, VENOUS: 0.9 mmol/L (ref 0.5–1.9)
LACTIC ACID, VENOUS: 1.2 mmol/L (ref 0.5–1.9)

## 2016-12-06 LAB — MAGNESIUM: MAGNESIUM: 1.7 mg/dL (ref 1.7–2.4)

## 2016-12-06 LAB — PHOSPHORUS: Phosphorus: 3.3 mg/dL (ref 2.5–4.6)

## 2016-12-06 MED ORDER — VITAMIN D 50 MCG (2000 UT) PO CAPS: 2000.0000 [IU] | ORAL_CAPSULE | Freq: Every day | ORAL | Status: DC

## 2016-12-06 MED ORDER — PANTOPRAZOLE SODIUM 40 MG PO PACK
40.0000 mg | PACK | ORAL | Status: DC
  Administered 2016-12-06 – 2017-01-03 (×29): 40 mg
  Filled 2016-12-06 (×31): qty 20

## 2016-12-06 MED ORDER — VITAMIN D 1000 UNITS PO TABS
2000.0000 [IU] | ORAL_TABLET | Freq: Every day | ORAL | Status: DC
  Administered 2016-12-06 – 2017-01-04 (×30): 2000 [IU] via ORAL
  Filled 2016-12-06 (×30): qty 2

## 2016-12-06 MED ORDER — VITAMIN C 500 MG PO TABS: 500.0000 mg | ORAL_TABLET | Freq: Every day | ORAL | Status: DC

## 2016-12-06 MED ORDER — CHLORHEXIDINE GLUCONATE 0.12% ORAL RINSE (MEDLINE KIT)
15.0000 mL | Freq: Two times a day (BID) | OROMUCOSAL | Status: DC
  Administered 2016-12-07 – 2016-12-20 (×26): 15 mL via OROMUCOSAL

## 2016-12-06 MED ORDER — POTASSIUM CHLORIDE 20 MEQ/15ML (10%) PO SOLN
40.0000 meq | Freq: Once | ORAL | Status: AC
  Administered 2016-12-06: 40 meq

## 2016-12-06 MED ORDER — VITAMIN D3 25 MCG (1000 UNIT) PO TABS
2000.0000 [IU] | ORAL_TABLET | Freq: Every day | ORAL | Status: DC
  Filled 2016-12-06: qty 2

## 2016-12-06 MED ORDER — FENTANYL CITRATE (PF) 100 MCG/2ML IJ SOLN
50.0000 ug | INTRAMUSCULAR | Status: DC | PRN
  Administered 2016-12-08 (×2): 50 ug via INTRAVENOUS
  Filled 2016-12-06 (×2): qty 2

## 2016-12-06 MED ORDER — MIDAZOLAM HCL 2 MG/2ML IJ SOLN
1.0000 mg | INTRAMUSCULAR | Status: DC | PRN
  Administered 2016-12-06 – 2016-12-14 (×12): 1 mg via INTRAVENOUS
  Filled 2016-12-06 (×13): qty 2

## 2016-12-06 MED ORDER — ORAL CARE MOUTH RINSE
15.0000 mL | Freq: Four times a day (QID) | OROMUCOSAL | Status: DC
  Administered 2016-12-06 – 2016-12-20 (×55): 15 mL via OROMUCOSAL

## 2016-12-06 MED ORDER — OSMOLITE 1.5 CAL PO LIQD
1000.0000 mL | ORAL | Status: DC
  Administered 2016-12-06 – 2016-12-08 (×4): 1000 mL
  Administered 2016-12-09: 85 mL
  Administered 2016-12-10 – 2016-12-13 (×6): 1000 mL
  Filled 2016-12-06 (×20): qty 1000

## 2016-12-06 MED ORDER — VITAMIN C 500 MG PO TABS
500.0000 mg | ORAL_TABLET | Freq: Every day | ORAL | Status: DC
  Administered 2016-12-06 – 2017-01-04 (×30): 500 mg
  Filled 2016-12-06 (×30): qty 1

## 2016-12-06 MED ORDER — FENTANYL CITRATE (PF) 100 MCG/2ML IJ SOLN: 50.0000 ug | INTRAMUSCULAR | Status: DC | PRN

## 2016-12-06 MED ORDER — MAGNESIUM SULFATE IN D5W 1-5 GM/100ML-% IV SOLN
1.0000 g | Freq: Once | INTRAVENOUS | Status: AC
  Administered 2016-12-06: 1 g via INTRAVENOUS
  Filled 2016-12-06: qty 100

## 2016-12-06 MED ORDER — OSMOLITE 1.5 CAL PO LIQD
1000.0000 mL | ORAL | Status: DC
  Filled 2016-12-06: qty 1000

## 2016-12-06 NOTE — Progress Notes (Signed)
PULMONARY / CRITICAL CARE MEDICINE   Name: Judy Spears MRN: [id number] DOB: Jun 24, 1950    ADMISSION DATE:  12/05/2016 CONSULTATION DATE:  12/05/16  REFERRING MD:  EDP  CHIEF COMPLAINT:  Increased secretions and WOB  HISTORY OF PRESENT ILLNESS:   History obtained through the patient's husband at bedside given her baseline mental status. 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 Known chronic hypoxic respiratory failure. Recently diagnosed with breast cancer having undergone lumpectomy summer 2018. Lives at home with family and does not at times as well as make small attempts to verbalize. Recently she has been found to have a broken right hip and is wheelchair-bound at baseline. Presents with 1 day history of increasing purulent sputum production and increased work of breathing. Her mental status has been slightly worse than usual. No subjective fever, chills, or sweats. No recent sick contacts that the husband reports. Patient has been more restless at home recently. Her saturations have dropped into the 70s at times today prompting her husband to bring her to the emergency room. Through imaging she was subsequently found to have bibasilar infiltrates suspicious for healthcare associated pneumonia and was started on broad-spectrum antibacterial therapy.  PAST MEDICAL HISTORY :  She  has a past medical history of Arthritis, Complication of anesthesia, Hair loss, History of exercise stress test, Hyperlipidemia, Insomnia, Migraine headache, Movement disorder, Postmenopausal, [place] encephalitis, and Premature atrial contractions.  PAST SURGICAL HISTORY: She  has a past surgical history that includes Hernia repair (2000); Rhinoplasty (1983); Cesarean section (1986); opirectinal membrane peel; cataracts right eye; Breast biopsy (Right, 07/27/2012); Breast biopsy (Right, 07/07/2016); Breast biopsy (Left, 06/30/2016); Tracheostomy; PEG placement; IR Replc Gastro/Colonic Tube Percut  W/Fluoro (09/29/2016); and BREAST BIOPSY WITH NEEDLE LOCALIZATION (Left, 01/01/2011).  Allergies  Allergen Reactions  . Cefepime Other (See Comments)    Status epilepticus- this reaction is questionable per husband.States that it was determined that pt did not have seizure activity. Status epilepticus  . Tetracyclines & Related Photosensitivity and Other (See Comments)    Blisters (childhood allergy)  . Doxycycline Monohydrate Other (See Comments)    Blisters, photosensitivity- family state that the reaction is actually to tetracycline.  . Pollen Extract Other (See Comments)    Running nose (seasonal allergies)    No current facility-administered medications on file prior to encounter.    Current Outpatient Medications on File Prior to Encounter  Medication Sig  . albuterol (PROVENTIL) (2.5 MG/3ML) 0.083% nebulizer solution Take 3 mLs (2.5 mg total) by nebulization every 6 (six) hours as needed for wheezing or shortness of breath. (Patient taking differently: Take 2.5 mg every 6 (six) hours as needed by nebulization for wheezing or shortness of breath (congestion). )  . amantadine (SYMMETREL) 50 MG/5ML solution PLACE 63mL INTO FEEDING TUBE TWICE DAILY (Patient taking differently: PLACE 10 ML (100 MG)  INTO FEEDING TUBE TWICE DAILY -   8AM AND NOON)  . Amino Acids-Protein Hydrolys (FEEDING SUPPLEMENT, PRO-STAT SUGAR FREE 64,) LIQD Place 30 mLs into feeding tube 2 (two) times daily.  [name] amLODipine (NORVASC) 10 MG tablet Place 1 tablet (10 mg total) into feeding tube daily.  [name] atorvastatin (LIPITOR) 20 MG tablet Place 20 mg into feeding tube at bedtime.   . baclofen (LIORESAL) 10 MG tablet PLACE 1 TABLET INTO FEEDING TUBE EVERY 6HOURS (Patient taking differently: PLACE 1 TABLET (10 MG) INTO FEEDING TUBE FOUR TIMES DAILY)  . bethanechol (URECHOLINE) 25 MG tablet Place 1 tablet (25 mg total) into feeding tube 2 (two) times  daily. FOR 2 WEEKS, THEN DECREASE TO DAILY FOR 2 WEEKS THEN STOP (Patient  taking differently: Place 25 mg See admin instructions into feeding tube. Start date 11/25/16: give 1 tablet (25 mg) per tube twice daily for 2 weeks, then decrease to once daily for 2 weeks, then stop)  . chlorhexidine (PERIDEX) 0.12 % solution 5ml IN THE MOUTH OR THROAT TWICE DAILY (Patient taking differently: SWAB MOUTH/THROAT WITH 5 ML TWICE DAILY)  . Cholecalciferol (VITAMIN D) 2000 units CAPS Place 2,000 Units daily into feeding tube.  . clotrimazole (LOTRIMIN) 1 % cream Apply topically 2 (two) times daily. (Patient taking differently: Apply 1 application 2 (two) times daily as needed topically (rash). )  . Coenzyme Q10 (COQ10 PO) Place 10 mLs at bedtime into feeding tube.  [name] CRANBERRY JUICE EXTRACT PO Place 60 mLs daily into feeding tube.  . docusate sodium (COLACE) 100 MG capsule 100 mg See admin instructions. Give 1 capsule (100 mg) per tube twice daily  . fluticasone (FLONASE) 50 MCG/ACT nasal spray Place 2 sprays 2 (two) times daily into both nostrils.  [name] gabapentin (NEURONTIN) 100 MG capsule One capsule--open and administer via PEG twice a day. (Patient taking differently: 100 mg See admin instructions. Open one capsule (100 mg) and administer per tube twice daily - 8 am and 5pm)  . guaiFENesin (ROBITUSSIN) 100 MG/5ML SOLN Place 10 mLs (200 mg total) into feeding tube 4 (four) times daily. (Patient taking differently: Place 10 mLs See admin instructions into feeding tube. Give 10 mls (200 mg) per tube two to four times daily for congestion)  . loratadine (CLARITIN) 10 MG tablet Place 1 tablet (10 mg total) into feeding tube daily as needed for allergies. (Patient taking differently: Place 10 mg daily into feeding tube. daily)  . Melatonin 5 MG CAPS Place 5 mg at bedtime into feeding tube.   . Multiple Vitamin (MULTIVITAMIN) LIQD Place 15 mLs into feeding tube daily.  . Nutritional Supplements (FEEDING SUPPLEMENT, OSMOLITE 1.5 CAL,) LIQD Place 1,000 mLs into feeding tube daily. (Patient  taking differently: Place See admin instructions into feeding tube. 85 ml/hour for 20 hours per day)  . potassium chloride 20 MEQ/15ML (10%) SOLN Place 15 mLs (20 mEq total) into feeding tube daily.  . primidone (MYSOLINE) 50 MG tablet Place 1 tablet (50 mg total) into feeding tube 4 (four) times daily.  . Probiotic Product (PROBIOTIC PO) Place 1 capsule 2 (two) times daily into feeding tube.  . sodium chloride HYPERTONIC 3 % nebulizer solution Take by nebulization 2 (two) times daily as needed for other. (Patient taking differently: Take 4 mLs 2 (two) times daily as needed by nebulization (to loosen up chest). )  . valACYclovir (VALTREX) 500 MG tablet Place 500 mg daily into feeding tube.   . vitamin C (ASCORBIC ACID) 500 MG tablet Place 500 mg daily into feeding tube.  . Water For Irrigation, Sterile (FREE WATER) SOLN Place 200 mLs into feeding tube every 8 (eight) hours. (Patient taking differently: Place 200 mLs 3 (three) times daily into feeding tube. )  . saccharomyces boulardii (FLORASTOR) 250 MG capsule Place 1 capsule (250 mg total) into feeding tube 2 (two) times daily. (Patient not taking: Reported on 12/05/2016)    FAMILY HISTORY:  Her indicated that her mother is deceased. She indicated that her father is alive. She indicated that both of her brothers are alive. She indicated that the status of her maternal grandfather is unknown. She indicated that both of her daughters are alive.  She indicated that her son is alive. She indicated that the status of her maternal uncle is unknown.   SOCIAL HISTORY: She  reports that  has never smoked. she has never used smokeless tobacco. She reports that she drinks alcohol. She reports that she does not use drugs.  REVIEW OF SYSTEMS:   Patient unable to verbalize 2/2 trach.   SUBJECTIVE:  Patient unable to provide, trach on vent.   VITAL SIGNS: BP 137/86   Pulse 91   Temp 99.6 F (37.6 C) (Oral)   Resp (!) 30   Wt 95 lb 14.4 oz (43.5 kg)    LMP 12/01/2010   SpO2 97%   BMI 17.54 kg/m   HEMODYNAMICS:    VENTILATOR SETTINGS: Vent Mode: PRVC FiO2 (%):  [40 %-98 %] 40 % Set Rate:  [14 bmp] 14 bmp Vt Set:  [400 mL] 400 mL PEEP:  [5 cmH20] 5 cmH20 Plateau Pressure:  [25 cmH20] 25 cmH20  INTAKE / OUTPUT: I/O last 3 completed shifts: In: 2916.3 [I.V.:1476.3; NG/GT:890; IV Piggyback:550] Out: 5053 [Urine:1650]  PHYSICAL EXAMINATION: General:  Thin, ill appearing female.  Neuro: attends to voice.  HEENT:  Fair dentition, MMM. AT.  Cardiovascular:  RRR, no murmur Lungs:  Coarse breath sounds throughout, no wheeze.  Abdomen:  SNTND, G tube CDI Musculoskeletal:  Poor muscle mass, contractures in bilateral upper extremities, L hip and L knee Skin:  WWP  LABS:  BMET Recent Labs  Lab 12/05/16 1559 12/05/16 1914 12/06/16 0247  NA 132* 133* 137  K 3.6 3.6 3.4*  CL 91* 95* 97*  CO2 30 29 31   BUN 14 12 11   CREATININE <0.30* 0.32* <0.30*  GLUCOSE 128* 154* 126*    Electrolytes Recent Labs  Lab 12/05/16 1559 12/05/16 1914 12/06/16 0247  CALCIUM 8.9 8.5* 8.3*  MG  --  1.7 1.7  PHOS  --  3.0 3.3    CBC Recent Labs  Lab 12/05/16 1559 12/05/16 1914 12/06/16 0247  WBC 16.6* 13.5* 10.7*  HGB 13.3 12.2 11.6*  HCT 40.7 37.5 36.2  PLT 336 293 273    Coag's No results for input(s): APTT, INR in the last 168 hours.  Sepsis Markers Recent Labs  Lab 12/05/16 1616 12/05/16 1647 12/05/16 1914  LATICACIDVEN 1.86 1.6 2.1*  PROCALCITON  --   --  0.15    ABG Recent Labs  Lab 12/05/16 1624  PHART 7.458*  PCO2ART 47.6  PO2ART 56.0*    Liver Enzymes Recent Labs  Lab 12/05/16 1559 12/05/16 1914  AST 148* 117*  ALT 225* 196*  ALKPHOS 130* 125  BILITOT 0.8 0.7  ALBUMIN 2.9* 2.6*    Cardiac Enzymes No results for input(s): TROPONINI, PROBNP in the last 168 hours.  Glucose No results for input(s): GLUCAP in the last 168 hours.  Imaging Dg Chest Port 1 View  Result Date:  12/06/2016 CLINICAL DATA:  Tracheostomy exchange.  Acute respiratory failure. EXAM: PORTABLE CHEST 1 VIEW COMPARISON:  12/06/2016, 12/05/2016 and 11/11/2016 FINDINGS: Tracheostomy tube in place. Persistent consolidation at the left lung base with a probable left effusion. The hazy infiltrate in the right midzone has increased slightly. Atelectasis at the right base is unchanged. Heart size and vascularity are normal. Diffuse osteopenia. IMPRESSION: 1. Tracheostomy tube appears in good position. 2. Slight increased hazy infiltrate in the right midzone. 3. Persistent consolidation and effusion at the left lung base. Electronically Signed   By: Lorriane Shire M.D.   On: 12/06/2016 09:21   Dg Chest  Port 1 View  Result Date: 12/06/2016 CLINICAL DATA:  Pneumonia EXAM: PORTABLE CHEST 1 VIEW COMPARISON:  December 05, 2016 FINDINGS: Tracheostomy catheter tip is 4.7 cm above the carina. No pneumothorax. There is airspace consolidation throughout much of the left lower lobe, increased from 1 day prior. There is a small left pleural effusion. There is patchy atelectasis in the right mid and lower lung zones, similar 1 day prior. Heart remains prominent with pulmonary vascularity within normal limits. No adenopathy. No focal bone lesions. IMPRESSION: Tracheostomy as described without pneumothorax. Increase in left lower lobe airspace consolidation, felt to represent progression of pneumonia. Small left pleural effusion. Patchy atelectasis in the right persist. Cardiac silhouette is stable. Electronically Signed   By: Lowella Grip III M.D.   On: 12/06/2016 07:05   Dg Chest Port 1 View  Result Date: 12/05/2016 CLINICAL DATA:  66 y/o  F; shortness of breath. EXAM: PORTABLE CHEST 1 VIEW COMPARISON:  11/11/2016 chest radiograph FINDINGS: Stable cardiac silhouette. Tracheostomy tube in situ. Interim development of patchy opacities within the lung bases bilaterally and diffuse pulmonary reticular opacities. No pleural  effusion or pneumothorax. Bones are unremarkable. IMPRESSION: Reticular opacities, probably mild interstitial edema. Patchy opacities in lung bases may represent alveolar edema or pneumonia. Electronically Signed   By: Kristine Garbe M.D.   On: 12/05/2016 16:40    STUDIES:  CXR 11/11 > reticular opacities, patchy opacities in bilateral bases edema vs PNA CXR 11/12 (s/p trach exchange and suction) > Tracheostomy tube appears in good position. Slight increased hazy infiltrate in the right midzone. Persistent consolidation and effusion at the left lung base.  CULTURES: MRSA PCR 11/11 > Blood Cultures x2 11/11 >>> Tracheal Aspirate Culture 11/11 >>> Urine Streptococcal Antigen 11/11 >>> negative Urine Legionella Antigen 11/11 >>>   ANTIBIOTICS: Vancomycin 11/11 >  Meropenem 11/11 >   SIGNIFICANT EVENTS: 11/11 admit 11/12 worsening WOB and desat > trach exchanged and placed on vent support  LINES/TUBES: Trach Shiley 53mm uncuffed (replaced 11/12) G tube  14 fr foley 11/11 >  RAC IV 11/11 >   DISCUSSION: 66 y.o. female with acute on chronic hypoxic respiratory failure. Has imaging findings suggestive of an acute infectious process.  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

## 2016-12-06 NOTE — Progress Notes (Signed)
Well known to our service  Called for desatuation, worsening neurostatus  RR 60 sat 77% on 100% TC General: not responding Neuro: eyes closed, enceph HEENT: trach in place PULM: ronchi severe CV:  s1 s2 RRT GI: soift, Bs low no r Extremities:  Edema  A HCAP Acute resp distress S/p trach  Emergent trach change done by me Removed old trach Placed 4 with cuff easily Cap pos, equal BS Stat pcxr 7-8 cc/kg, may need peep 8-10, rate 20 pcxr stat follow up ABX per CCM primary May ned bronch if continues to collapse / hypoxia abg No fmaily in room  [employer]. Titus Mould, MD, [place] Pgr: [facility] '

## 2016-12-06 NOTE — Progress Notes (Signed)
#  4 Shiley cuffed trach that was placed at bedside in ED did not come to Unit with pt once admitted. Unit RT called ED RT who was unable to locate said trach. New trach ordered, (#4 cuffed and #4 cuffless), as well as inner cannulas and suction catheters. Extra trach's are now at bedside.

## 2016-12-06 NOTE — Progress Notes (Signed)
Initial Nutrition Assessment  DOCUMENTATION CODES:   Underweight  INTERVENTION:    Osmolite 1.5 at 85 ml/h (2040 ml per day)  Pro-stat 30 ml BID  Provides 3260 kcal, 158 gm protein, 1554 ml free water daily  NUTRITION DIAGNOSIS:   Increased nutrient needs related to chronic illness, wound healing as evidenced by estimated needs.  GOAL:   Patient will meet greater than or equal to 90% of their needs  MONITOR:   TF tolerance, Vent status, Weight trends, Skin, I & O's  REASON FOR ASSESSMENT:   Consult Enteral/tube feeding initiation and management  ASSESSMENT:   66 yo female with PMH of chronic hypoxic respiratory failure, trach & PEG, [place] encephalitis, breast CA, recently found to have R hip fx who was admitted on 11/11 with increased WOB related to PNA.  Spoke with patient's caregiver in room who reports that patient receives Osmolite 1.5 at 85 ml/h for ~20 hours per day at home. She is off TF for a few hours for outpatient therapy (OT, PT, Speech). She was recently diagnosed with a hip fx, so they have backed off on PT. The osteoporosis clinic ordered a calcium supplement, but she has not been started on it yet. Husband would like to start it now. Pharmacy to contact outpatient pharmacy about home prescription and start calcium supplementation in the hospital.   Patient has been gaining weight on home TF regimen. She is up from ~85-87 lbs to 97 lbs per caregiver. Patient does not take any PO's at home.  NUTRITION - FOCUSED PHYSICAL EXAM:    Most Recent Value  Orbital Region  Moderate depletion  Upper Arm Region  Moderate depletion  Thoracic and Lumbar Region  Unable to assess  Buccal Region  Moderate depletion  Temple Region  Moderate depletion  Clavicle Bone Region  Moderate depletion  Clavicle and Acromion Bone Region  Moderate depletion  Scapular Bone Region  Unable to assess  Dorsal Hand  Moderate depletion  Patellar Region  Moderate depletion  Anterior  Thigh Region  Moderate depletion  Posterior Calf Region  Moderate depletion  Edema (RD Assessment)  None  Hair  Reviewed  Eyes  Reviewed  Mouth  Reviewed  Skin  Reviewed  Nails  Reviewed      Muscle depletion most likely related to disease progression as TF intake has been meeting protein and calorie needs.   Diet Order:  Diet NPO time specified  EDUCATION NEEDS:   No education needs have been identified at this time  Skin:  Skin Assessment: Skin Integrity Issues: Skin Integrity Issues:: Stage II Stage II: R hip  Last BM:  PTA  Height:   Ht Readings from Last 1 Encounters:  12/06/16 5\' 2"  (1.575 m)    Weight:   Wt Readings from Last 1 Encounters:  12/06/16 95 lb 14.4 oz (43.5 kg)    Ideal Body Weight:  50 kg  BMI:  Body mass index is 17.54 kg/m.  Estimated Nutritional Needs:   Kcal:  >2000  Protein:  >80 gm  Fluid:  >2 L   Molli Barrows, RD, LDN, CNSC Pager [phone] After Hours Pager [phone]

## 2016-12-06 NOTE — Progress Notes (Signed)
[place] [facility] Progress Note Patient Name: Judy Spears DOB: Jan 24, 1951 MRN: [id number]   Date of Service  12/06/2016  HPI/Events of Note  K+ = 3.4, Mg++ = 1.7 and Creatinine < 0.30.  eICU Interventions  Will replace K+ and Mg++.     Intervention Category Major Interventions: Electrolyte abnormality - evaluation and management  Rozell Kettlewell [place] 12/06/2016, 5:29 AM

## 2016-12-06 NOTE — Progress Notes (Signed)
Patient 02 saturations 80% on 100% ATC. Patient RR in upper 40's. Patient in obvious distress. Patient trach changed to 4 cuffed and placed on ventilator per verbal order from Dr. Titus Mould. Patient is tolerating well at time. No distress noted. RT will continue to monitor.

## 2016-12-07 ENCOUNTER — Inpatient Hospital Stay (HOSPITAL_COMMUNITY): Payer: Medicare Other

## 2016-12-07 ENCOUNTER — Ambulatory Visit: Payer: Medicare Other | Admitting: Physical Therapy

## 2016-12-07 ENCOUNTER — Ambulatory Visit: Payer: Medicare Other | Admitting: Occupational Therapy

## 2016-12-07 LAB — BASIC METABOLIC PANEL
Anion gap: 8 (ref 5–15)
Anion gap: 9 (ref 5–15)
BUN: 10 mg/dL (ref 6–20)
BUN: 10 mg/dL (ref 6–20)
CALCIUM: 8.1 mg/dL — AB (ref 8.9–10.3)
CHLORIDE: 96 mmol/L — AB (ref 101–111)
CHLORIDE: 93 mmol/L — AB (ref 101–111)
CO2: 31 mmol/L (ref 22–32)
CO2: 28 mmol/L (ref 22–32)
Calcium: 8.3 mg/dL — ABNORMAL LOW (ref 8.9–10.3)
Creatinine, Ser: <0.3 mg/dL — ABNORMAL LOW (ref 0.44–1.00)
GLUCOSE: 134 mg/dL — AB (ref 65–99)
Glucose, Bld: 121 mg/dL — ABNORMAL HIGH (ref 65–99)
POTASSIUM: 2.9 mmol/L — AB (ref 3.5–5.1)
Potassium: 3.8 mmol/L (ref 3.5–5.1)
SODIUM: 135 mmol/L (ref 135–145)
SODIUM: 130 mmol/L — AB (ref 135–145)

## 2016-12-07 LAB — GLUCOSE, CAPILLARY
GLUCOSE-CAPILLARY: 122 mg/dL — AB (ref 65–99)
GLUCOSE-CAPILLARY: 145 mg/dL — AB (ref 65–99)
GLUCOSE-CAPILLARY: 152 mg/dL — AB (ref 65–99)
GLUCOSE-CAPILLARY: 163 mg/dL — AB (ref 65–99)
GLUCOSE-CAPILLARY: 180 mg/dL — AB (ref 65–99)
Glucose-Capillary: 158 mg/dL — ABNORMAL HIGH (ref 65–99)
Glucose-Capillary: 109 mg/dL — ABNORMAL HIGH (ref 65–99)

## 2016-12-07 LAB — LEGIONELLA PNEUMOPHILA SEROGP 1 UR AG: L. pneumophila Serogp 1 Ur Ag: NEGATIVE

## 2016-12-07 LAB — CBC
HEMATOCRIT: 35.8 % — AB (ref 36.0–46.0)
HEMOGLOBIN: 11.5 g/dL — AB (ref 12.0–15.0)
MCH: 29 pg (ref 26.0–34.0)
MCHC: 32.1 g/dL (ref 30.0–36.0)
MCV: 90.2 fL (ref 78.0–100.0)
Platelets: 287 10*3/uL (ref 150–400)
RBC: 3.97 MIL/uL (ref 3.87–5.11)
RDW: 16.7 % — ABNORMAL HIGH (ref 11.5–15.5)
WBC: 10.8 10*3/uL — ABNORMAL HIGH (ref 4.0–10.5)

## 2016-12-07 LAB — MAGNESIUM: MAGNESIUM: 1.7 mg/dL (ref 1.7–2.4)

## 2016-12-07 LAB — MRSA PCR SCREENING: MRSA by PCR: POSITIVE — AB

## 2016-12-07 MED ORDER — MUPIROCIN 2 % EX OINT
1.0000 "application " | TOPICAL_OINTMENT | Freq: Two times a day (BID) | CUTANEOUS | Status: AC
  Administered 2016-12-07 – 2016-12-11 (×10): 1 via NASAL
  Filled 2016-12-07: qty 22

## 2016-12-07 MED ORDER — POTASSIUM CHLORIDE 10 MEQ/100ML IV SOLN
10.0000 meq | INTRAVENOUS | Status: AC
  Administered 2016-12-07 (×6): 10 meq via INTRAVENOUS
  Filled 2016-12-07 (×3): qty 100

## 2016-12-07 MED ORDER — CALCIUM CARBONATE-VITAMIN D 500-200 MG-UNIT PO TABS
2.0000 | ORAL_TABLET | Freq: Every day | ORAL | Status: DC
  Administered 2016-12-07 – 2017-01-04 (×29): 2 via ORAL
  Filled 2016-12-07 (×30): qty 2

## 2016-12-07 MED ORDER — MAGNESIUM SULFATE 2 GM/50ML IV SOLN
2.0000 g | Freq: Once | INTRAVENOUS | Status: AC
  Administered 2016-12-07: 2 g via INTRAVENOUS
  Filled 2016-12-07: qty 50

## 2016-12-07 MED ORDER — SODIUM CHLORIDE 0.9 % IV SOLN
INTRAVENOUS | Status: DC
  Administered 2016-12-07: 500 mL via INTRAVENOUS
  Administered 2016-12-10: 10:00:00 via INTRAVENOUS

## 2016-12-07 MED ORDER — CHLORHEXIDINE GLUCONATE CLOTH 2 % EX PADS
6.0000 | MEDICATED_PAD | Freq: Every day | CUTANEOUS | Status: AC
  Administered 2016-12-07 – 2016-12-11 (×3): 6 via TOPICAL

## 2016-12-07 NOTE — Care Management Note (Signed)
Case Management Note  Patient Details  Name: Daisha Filosa MRN: [id number] Date of Birth: 1950/04/16  Subjective/Objective:    Pt admitted with chronic resp failure - acute infectious process                Action/Plan:   PTA from home with husband - pt is total care and has 24 hour caregiver.  Pt has had trach for approximately 2 years - per caregiver pt is usually not on oxygen via trach.   Per caregiver - pt transports via private vehicle.     Expected Discharge Date:                  Expected Discharge Plan:  [place]  In-House Referral:     Discharge planning Services  CM Consult  Post Acute Care Choice:    Choice offered to:     DME Arranged:    DME Agency:     HH Arranged:    HH Agency:     Status of Service:     If discussed at [employer], dates discussed:    Additional Comments:  Maryclare Labrador, RN 12/07/2016, 2:33 PM

## 2016-12-07 NOTE — Progress Notes (Signed)
PULMONARY / CRITICAL CARE MEDICINE   Name: Judy Spears MRN: [id number] DOB: [birth year]    ADMISSION DATE:  12/05/2016 CONSULTATION DATE:  12/05/16  REFERRING MD:  EDP  CHIEF COMPLAINT:  Increased secretions and WOB  HISTORY OF PRESENT ILLNESS:   History obtained through the patient's husband at bedside given her baseline mental status. 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 Known chronic hypoxic respiratory failure. Recently diagnosed with breast cancer having undergone lumpectomy summer 2018. Lives at home with family and does not at times as well as make small attempts to verbalize. Recently she has been found to have a broken right hip and is wheelchair-bound at baseline. Presents with 1 day history of increasing purulent sputum production and increased work of breathing. Her mental status has been slightly worse than usual. No subjective fever, chills, or sweats. No recent sick contacts that the husband reports. Patient has been more restless at home recently. Her saturations have dropped into the 70s at times today prompting her husband to bring her to the emergency room. Through imaging she was subsequently found to have bibasilar infiltrates suspicious for healthcare associated pneumonia and was started on broad-spectrum antibacterial therapy.  PAST MEDICAL HISTORY :  She  has a past medical history of Arthritis, Complication of anesthesia, Hair loss, History of exercise stress test, Hyperlipidemia, Insomnia, Migraine headache, Movement disorder, Postmenopausal, [place] encephalitis, and Premature atrial contractions.  PAST SURGICAL HISTORY: She  has a past surgical history that includes Hernia repair (2000); Rhinoplasty (1983); Cesarean section (1986); opirectinal membrane peel; cataracts right eye; Breast biopsy (Right, 07/27/2012); Breast biopsy (Right, 07/07/2016); Breast biopsy (Left, 06/30/2016); Tracheostomy; PEG placement; IR Replc Gastro/Colonic Tube Percut  W/Fluoro (09/29/2016); and BREAST BIOPSY WITH NEEDLE LOCALIZATION (Left, 01/01/2011).  Allergies  Allergen Reactions  . Cefepime Other (See Comments)    Status epilepticus- this reaction is questionable per husband.States that it was determined that pt did not have seizure activity. Status epilepticus  . Tetracyclines & Related Photosensitivity and Other (See Comments)    Blisters (childhood allergy)  . Doxycycline Monohydrate Other (See Comments)    Blisters, photosensitivity- family state that the reaction is actually to tetracycline.  . Pollen Extract Other (See Comments)    Running nose (seasonal allergies)    No current facility-administered medications on file prior to encounter.    Current Outpatient Medications on File Prior to Encounter  Medication Sig  . albuterol (PROVENTIL) (2.5 MG/3ML) 0.083% nebulizer solution Take 3 mLs (2.5 mg total) by nebulization every 6 (six) hours as needed for wheezing or shortness of breath. (Patient taking differently: Take 2.5 mg every 6 (six) hours as needed by nebulization for wheezing or shortness of breath (congestion). )  . amantadine (SYMMETREL) 50 MG/5ML solution PLACE 44mL INTO FEEDING TUBE TWICE DAILY (Patient taking differently: PLACE 10 ML (100 MG)  INTO FEEDING TUBE TWICE DAILY -   8AM AND NOON)  . Amino Acids-Protein Hydrolys (FEEDING SUPPLEMENT, PRO-STAT SUGAR FREE 64,) LIQD Place 30 mLs into feeding tube 2 (two) times daily.  [name] amLODipine (NORVASC) 10 MG tablet Place 1 tablet (10 mg total) into feeding tube daily.  [name] atorvastatin (LIPITOR) 20 MG tablet Place 20 mg into feeding tube at bedtime.   . baclofen (LIORESAL) 10 MG tablet PLACE 1 TABLET INTO FEEDING TUBE EVERY 6HOURS (Patient taking differently: PLACE 1 TABLET (10 MG) INTO FEEDING TUBE FOUR TIMES DAILY)  . bethanechol (URECHOLINE) 25 MG tablet Place 1 tablet (25 mg total) into feeding tube 2 (two) times  daily. FOR 2 WEEKS, THEN DECREASE TO DAILY FOR 2 WEEKS THEN STOP (Patient  taking differently: Place 25 mg See admin instructions into feeding tube. Start date 11/25/16: give 1 tablet (25 mg) per tube twice daily for 2 weeks, then decrease to once daily for 2 weeks, then stop)  . chlorhexidine (PERIDEX) 0.12 % solution 84ml IN THE MOUTH OR THROAT TWICE DAILY (Patient taking differently: SWAB MOUTH/THROAT WITH 5 ML TWICE DAILY)  . Cholecalciferol (VITAMIN D) 2000 units CAPS Place 2,000 Units daily into feeding tube.  . clotrimazole (LOTRIMIN) 1 % cream Apply topically 2 (two) times daily. (Patient taking differently: Apply 1 application 2 (two) times daily as needed topically (rash). )  . Coenzyme Q10 (COQ10 PO) Place 10 mLs at bedtime into feeding tube.  [name] CRANBERRY JUICE EXTRACT PO Place 60 mLs daily into feeding tube.  . docusate sodium (COLACE) 100 MG capsule 100 mg See admin instructions. Give 1 capsule (100 mg) per tube twice daily  . fluticasone (FLONASE) 50 MCG/ACT nasal spray Place 2 sprays 2 (two) times daily into both nostrils.  [name] gabapentin (NEURONTIN) 100 MG capsule One capsule--open and administer via PEG twice a day. (Patient taking differently: 100 mg See admin instructions. Open one capsule (100 mg) and administer per tube twice daily - 8 am and 5pm)  . guaiFENesin (ROBITUSSIN) 100 MG/5ML SOLN Place 10 mLs (200 mg total) into feeding tube 4 (four) times daily. (Patient taking differently: Place 10 mLs See admin instructions into feeding tube. Give 10 mls (200 mg) per tube two to four times daily for congestion)  . loratadine (CLARITIN) 10 MG tablet Place 1 tablet (10 mg total) into feeding tube daily as needed for allergies. (Patient taking differently: Place 10 mg daily into feeding tube. daily)  . Melatonin 5 MG CAPS Place 5 mg at bedtime into feeding tube.   . Multiple Vitamin (MULTIVITAMIN) LIQD Place 15 mLs into feeding tube daily.  . Nutritional Supplements (FEEDING SUPPLEMENT, OSMOLITE 1.5 CAL,) LIQD Place 1,000 mLs into feeding tube daily. (Patient  taking differently: Place See admin instructions into feeding tube. 85 ml/hour for 20 hours per day)  . potassium chloride 20 MEQ/15ML (10%) SOLN Place 15 mLs (20 mEq total) into feeding tube daily.  . primidone (MYSOLINE) 50 MG tablet Place 1 tablet (50 mg total) into feeding tube 4 (four) times daily.  . Probiotic Product (PROBIOTIC PO) Place 1 capsule 2 (two) times daily into feeding tube.  . sodium chloride HYPERTONIC 3 % nebulizer solution Take by nebulization 2 (two) times daily as needed for other. (Patient taking differently: Take 4 mLs 2 (two) times daily as needed by nebulization (to loosen up chest). )  . valACYclovir (VALTREX) 500 MG tablet Place 500 mg daily into feeding tube.   . vitamin C (ASCORBIC ACID) 500 MG tablet Place 500 mg daily into feeding tube.  . Water For Irrigation, Sterile (FREE WATER) SOLN Place 200 mLs into feeding tube every 8 (eight) hours. (Patient taking differently: Place 200 mLs 3 (three) times daily into feeding tube. )  . saccharomyces boulardii (FLORASTOR) 250 MG capsule Place 1 capsule (250 mg total) into feeding tube 2 (two) times daily. (Patient not taking: Reported on 12/05/2016)    FAMILY HISTORY:  Her indicated that her mother is deceased. She indicated that her father is alive. She indicated that both of her brothers are alive. She indicated that the status of her maternal grandfather is unknown. She indicated that both of her daughters are alive.  She indicated that her son is alive. She indicated that the status of her maternal uncle is unknown.   SOCIAL HISTORY: She  reports that  has never smoked. she has never used smokeless tobacco. She reports that she drinks alcohol. She reports that she does not use drugs.  REVIEW OF SYSTEMS:   Patient unable to verbalize 2/2 trach.   SUBJECTIVE:  Patient unable to provide 2/2 trach.  VITAL SIGNS: BP (!) 117/42   Pulse 92   Temp 99.6 F (37.6 C) (Oral)   Resp (!) 33   Ht 5\' 2"  (1.575 m)   Wt 93 lb  4.1 oz (42.3 kg)   LMP 12/01/2010   SpO2 100%   BMI 17.06 kg/m   HEMODYNAMICS:    VENTILATOR SETTINGS: Vent Mode: PRVC FiO2 (%):  [40 %-80 %] 40 % Set Rate:  [14 bmp-20 bmp] 20 bmp Vt Set:  [400 mL] 400 mL PEEP:  [5 cmH20] 5 cmH20 Plateau Pressure:  [15 cmH20-25 cmH20] 15 cmH20  INTAKE / OUTPUT: I/O last 3 completed shifts: In: 8357.1 [I.V.:3876.3; NG/GT:3330.8; IV Piggyback:1150] Out: 8676 [Urine:5125]  PHYSICAL EXAMINATION: General:  Thin, ill appearing female.  Neuro: attends to voice.  HEENT:  Fair dentition, MMM. AT.  Cardiovascular:  RRR, no murmur Lungs:  Coarse breath sounds throughout, no wheeze.  Abdomen:  SNTND, G tube CDI Musculoskeletal:  Poor muscle mass, contractures in bilateral upper extremities, L hip and L knee Skin:  WWP  LABS:  BMET Recent Labs  Lab 12/05/16 1914 12/06/16 0247 12/07/16 0322  NA 133* 137 135  K 3.6 3.4* 2.9*  CL 95* 97* 96*  CO2 29 31 31   BUN 12 11 10   CREATININE 0.32* <0.30* <0.30*  GLUCOSE 154* 126* 134*    Electrolytes Recent Labs  Lab 12/05/16 1914 12/06/16 0247 12/07/16 0322  CALCIUM 8.5* 8.3* 8.3*  MG 1.7 1.7 1.7  PHOS 3.0 3.3  --     CBC Recent Labs  Lab 12/05/16 1914 12/06/16 0247 12/07/16 0322  WBC 13.5* 10.7* 10.8*  HGB 12.2 11.6* 11.5*  HCT 37.5 36.2 35.8*  PLT 293 273 287    Coag's No results for input(s): APTT, INR in the last 168 hours.  Sepsis Markers Recent Labs  Lab 12/05/16 1914 12/06/16 1126 12/06/16 1408  LATICACIDVEN 2.1* 0.9 1.2  PROCALCITON 0.15  --   --     ABG Recent Labs  Lab 12/05/16 1624  PHART 7.458*  PCO2ART 47.6  PO2ART 56.0*    Liver Enzymes Recent Labs  Lab 12/05/16 1559 12/05/16 1914  AST 148* 117*  ALT 225* 196*  ALKPHOS 130* 125  BILITOT 0.8 0.7  ALBUMIN 2.9* 2.6*    Cardiac Enzymes No results for input(s): TROPONINI, PROBNP in the last 168 hours.  Glucose Recent Labs  Lab 12/07/16 0000 12/07/16 0402  GLUCAP 180* 163*     Imaging Dg Chest Port 1 View  Result Date: 12/07/2016 CLINICAL DATA:  Healthcare associated pneumonia. EXAM: PORTABLE CHEST 1 VIEW COMPARISON:  12/06/2016. FINDINGS: Tracheostomy tube in stable position. Surgical clips noted over the left chest. Heart size stable. Persistent right mid lung field and left base atelectasis/infiltrates. No interim change. Small left pleural effusion cannot be excluded. No pneumothorax. IMPRESSION: 1. Tracheostomy tube stable position. 2. Persistent right mid lung field and left base atelectasis and infiltrates and small left pleural effusion. No change. Electronically Signed   By: Marcello Moores  Register   On: 12/07/2016 06:54   Dg Chest Port 1 [address]  Result Date: 12/06/2016 CLINICAL DATA:  Tracheostomy exchange.  Acute respiratory failure. EXAM: PORTABLE CHEST 1 VIEW COMPARISON:  12/06/2016, 12/05/2016 and 11/11/2016 FINDINGS: Tracheostomy tube in place. Persistent consolidation at the left lung base with a probable left effusion. The hazy infiltrate in the right midzone has increased slightly. Atelectasis at the right base is unchanged. Heart size and vascularity are normal. Diffuse osteopenia. IMPRESSION: 1. Tracheostomy tube appears in good position. 2. Slight increased hazy infiltrate in the right midzone. 3. Persistent consolidation and effusion at the left lung base. Electronically Signed   By: Lorriane Shire M.D.   On: 12/06/2016 09:21    STUDIES:  CXR 11/11 > reticular opacities, patchy opacities in bilateral bases edema vs PNA CXR 11/12 (s/p trach exchange and suction) > Tracheostomy tube appears in good position. Slight increased hazy infiltrate in the right midzone. Persistent consolidation and effusion at the left lung base.  CULTURES: MRSA PCR 11/11 > positive Blood Cultures x2 11/11 >>> NGTD <24 hrs Tracheal Aspirate Culture 11/11 >>>  Urine Streptococcal Antigen 11/11 >>> negative Urine Legionella Antigen 11/11 >>>   ANTIBIOTICS: Vancomycin 11/11 >   Meropenem 11/11 >   SIGNIFICANT EVENTS: 11/11 admit 11/12 worsening WOB and desat > trach exchanged and placed on vent support  LINES/TUBES: Trach Shiley 69mm uncuffed (replaced 11/12) G tube  14 fr foley 11/11 >  RAC IV 11/11 >   DISCUSSION: 66 y.o. female with acute on chronic hypoxic respiratory failure. Has imaging findings suggestive of an acute infectious process.  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 - Inter-disciplinary family meet or Palliative Care meeting due by:  11/18  Ralene Ok, MD PGY-2, [facility] 12/07/2016 7:46 AM

## 2016-12-07 NOTE — Progress Notes (Addendum)
[facility] Progress Note Patient Name: Judy Spears DOB: [birth year] MRN: [id number]   Date of Service  12/07/2016  HPI/Events of Note  K+ = 2.9, Mg++ = 1.7 and Creatinine < 0.30.  eICU Interventions  Will order: 1. Replace K+ and Mg++. 2. BMP at 12 noon.      Intervention Category Major Interventions: Electrolyte abnormality - evaluation and management  Alenna Russell Eugene 12/07/2016, 4:18 AM

## 2016-12-08 ENCOUNTER — Encounter: Payer: Medicare Other | Admitting: Occupational Therapy

## 2016-12-08 ENCOUNTER — Inpatient Hospital Stay (HOSPITAL_COMMUNITY): Payer: Medicare Other

## 2016-12-08 ENCOUNTER — Other Ambulatory Visit: Payer: Self-pay

## 2016-12-08 ENCOUNTER — Ambulatory Visit: Payer: Medicare Other | Admitting: Physical Therapy

## 2016-12-08 LAB — BASIC METABOLIC PANEL
ANION GAP: 10 (ref 5–15)
BUN: 10 mg/dL (ref 6–20)
CALCIUM: 8.7 mg/dL — AB (ref 8.9–10.3)
CO2: 31 mmol/L (ref 22–32)
Chloride: 97 mmol/L — ABNORMAL LOW (ref 101–111)
GLUCOSE: 102 mg/dL — AB (ref 65–99)
Potassium: 3.9 mmol/L (ref 3.5–5.1)
Sodium: 138 mmol/L (ref 135–145)

## 2016-12-08 LAB — GLUCOSE, CAPILLARY: GLUCOSE-CAPILLARY: 115 mg/dL — AB (ref 65–99)

## 2016-12-08 LAB — CBC
HCT: 39.2 % (ref 36.0–46.0)
Hemoglobin: 12.6 g/dL (ref 12.0–15.0)
MCH: 29 pg (ref 26.0–34.0)
MCHC: 32.1 g/dL (ref 30.0–36.0)
MCV: 90.3 fL (ref 78.0–100.0)
PLATELETS: 316 10*3/uL (ref 150–400)
RBC: 4.34 MIL/uL (ref 3.87–5.11)
RDW: 16.6 % — AB (ref 11.5–15.5)
WBC: 8.4 10*3/uL (ref 4.0–10.5)

## 2016-12-08 LAB — CULTURE, RESPIRATORY W GRAM STAIN

## 2016-12-08 LAB — CULTURE, RESPIRATORY

## 2016-12-08 MED ORDER — GABAPENTIN 250 MG/5ML PO SOLN
100.0000 mg | Freq: Two times a day (BID) | ORAL | Status: DC
  Administered 2016-12-08 – 2017-01-04 (×55): 100 mg
  Filled 2016-12-08 (×60): qty 2

## 2016-12-08 NOTE — Progress Notes (Signed)
PULMONARY / CRITICAL CARE MEDICINE   Name: Judy Spears MRN: [id number] DOB: 1950/06/14    ADMISSION DATE:  12/05/2016 CONSULTATION DATE:  12/05/16  REFERRING MD:  EDP  CHIEF COMPLAINT:  Increased secretions and WOB  HISTORY OF PRESENT ILLNESS:   History obtained through the patient's husband at bedside given her baseline mental status. 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 Known chronic hypoxic respiratory failure. Recently diagnosed with breast cancer having undergone lumpectomy summer 2018. Lives at home with family and does not at times as well as make small attempts to verbalize. Recently she has been found to have a broken right hip and is wheelchair-bound at baseline. Presents with 1 day history of increasing purulent sputum production and increased work of breathing. Her mental status has been slightly worse than usual. No subjective fever, chills, or sweats. No recent sick contacts that the husband reports. Patient has been more restless at home recently. Her saturations have dropped into the 70s at times today prompting her husband to bring her to the emergency room. Through imaging she was subsequently found to have bibasilar infiltrates suspicious for healthcare associated pneumonia and was started on broad-spectrum antibacterial therapy.  PAST MEDICAL HISTORY :  She  has a past medical history of Arthritis, Complication of anesthesia, Hair loss, History of exercise stress test, Hyperlipidemia, Insomnia, Migraine headache, Movement disorder, Postmenopausal, [place] encephalitis, and Premature atrial contractions.  PAST SURGICAL HISTORY: She  has a past surgical history that includes Hernia repair (2000); Rhinoplasty (1983); Cesarean section (1986); opirectinal membrane peel; cataracts right eye; Breast biopsy (Right, 07/27/2012); Breast biopsy (Right, 07/07/2016); Breast biopsy (Left, 06/30/2016); Tracheostomy; PEG placement; IR Replc Gastro/Colonic Tube Percut  W/Fluoro (09/29/2016); and BREAST BIOPSY WITH NEEDLE LOCALIZATION (Left, 01/01/2011).  Allergies  Allergen Reactions  . Cefepime Other (See Comments)    Status epilepticus- this reaction is questionable per husband.States that it was determined that pt did not have seizure activity. Status epilepticus  . Tetracyclines & Related Photosensitivity and Other (See Comments)    Blisters (childhood allergy)  . Doxycycline Monohydrate Other (See Comments)    Blisters, photosensitivity- family state that the reaction is actually to tetracycline.  . Pollen Extract Other (See Comments)    Running nose (seasonal allergies)    No current facility-administered medications on file prior to encounter.    Current Outpatient Medications on File Prior to Encounter  Medication Sig  . albuterol (PROVENTIL) (2.5 MG/3ML) 0.083% nebulizer solution Take 3 mLs (2.5 mg total) by nebulization every 6 (six) hours as needed for wheezing or shortness of breath. (Patient taking differently: Take 2.5 mg every 6 (six) hours as needed by nebulization for wheezing or shortness of breath (congestion). )  . amantadine (SYMMETREL) 50 MG/5ML solution PLACE 32mL INTO FEEDING TUBE TWICE DAILY (Patient taking differently: PLACE 10 ML (100 MG)  INTO FEEDING TUBE TWICE DAILY -   8AM AND NOON)  . Amino Acids-Protein Hydrolys (FEEDING SUPPLEMENT, PRO-STAT SUGAR FREE 64,) LIQD Place 30 mLs into feeding tube 2 (two) times daily.  [name] amLODipine (NORVASC) 10 MG tablet Place 1 tablet (10 mg total) into feeding tube daily.  [name] atorvastatin (LIPITOR) 20 MG tablet Place 20 mg into feeding tube at bedtime.   . baclofen (LIORESAL) 10 MG tablet PLACE 1 TABLET INTO FEEDING TUBE EVERY 6HOURS (Patient taking differently: PLACE 1 TABLET (10 MG) INTO FEEDING TUBE FOUR TIMES DAILY)  . bethanechol (URECHOLINE) 25 MG tablet Place 1 tablet (25 mg total) into feeding tube 2 (two) times  daily. FOR 2 WEEKS, THEN DECREASE TO DAILY FOR 2 WEEKS THEN STOP (Patient  taking differently: Place 25 mg See admin instructions into feeding tube. Start date 11/25/16: give 1 tablet (25 mg) per tube twice daily for 2 weeks, then decrease to once daily for 2 weeks, then stop)  . chlorhexidine (PERIDEX) 0.12 % solution 82ml IN THE MOUTH OR THROAT TWICE DAILY (Patient taking differently: SWAB MOUTH/THROAT WITH 5 ML TWICE DAILY)  . Cholecalciferol (VITAMIN D) 2000 units CAPS Place 2,000 Units daily into feeding tube.  . clotrimazole (LOTRIMIN) 1 % cream Apply topically 2 (two) times daily. (Patient taking differently: Apply 1 application 2 (two) times daily as needed topically (rash). )  . Coenzyme Q10 (COQ10 PO) Place 10 mLs at bedtime into feeding tube.  [name] CRANBERRY JUICE EXTRACT PO Place 60 mLs daily into feeding tube.  . docusate sodium (COLACE) 100 MG capsule 100 mg See admin instructions. Give 1 capsule (100 mg) per tube twice daily  . fluticasone (FLONASE) 50 MCG/ACT nasal spray Place 2 sprays 2 (two) times daily into both nostrils.  [name] gabapentin (NEURONTIN) 100 MG capsule One capsule--open and administer via PEG twice a day. (Patient taking differently: 100 mg See admin instructions. Open one capsule (100 mg) and administer per tube twice daily - 8 am and 5pm)  . guaiFENesin (ROBITUSSIN) 100 MG/5ML SOLN Place 10 mLs (200 mg total) into feeding tube 4 (four) times daily. (Patient taking differently: Place 10 mLs See admin instructions into feeding tube. Give 10 mls (200 mg) per tube two to four times daily for congestion)  . loratadine (CLARITIN) 10 MG tablet Place 1 tablet (10 mg total) into feeding tube daily as needed for allergies. (Patient taking differently: Place 10 mg daily into feeding tube. daily)  . Melatonin 5 MG CAPS Place 5 mg at bedtime into feeding tube.   . Multiple Vitamin (MULTIVITAMIN) LIQD Place 15 mLs into feeding tube daily.  . Nutritional Supplements (FEEDING SUPPLEMENT, OSMOLITE 1.5 CAL,) LIQD Place 1,000 mLs into feeding tube daily. (Patient  taking differently: Place See admin instructions into feeding tube. 85 ml/hour for 20 hours per day)  . potassium chloride 20 MEQ/15ML (10%) SOLN Place 15 mLs (20 mEq total) into feeding tube daily.  . primidone (MYSOLINE) 50 MG tablet Place 1 tablet (50 mg total) into feeding tube 4 (four) times daily.  . Probiotic Product (PROBIOTIC PO) Place 1 capsule 2 (two) times daily into feeding tube.  . sodium chloride HYPERTONIC 3 % nebulizer solution Take by nebulization 2 (two) times daily as needed for other. (Patient taking differently: Take 4 mLs 2 (two) times daily as needed by nebulization (to loosen up chest). )  . valACYclovir (VALTREX) 500 MG tablet Place 500 mg daily into feeding tube.   . vitamin C (ASCORBIC ACID) 500 MG tablet Place 500 mg daily into feeding tube.  . Water For Irrigation, Sterile (FREE WATER) SOLN Place 200 mLs into feeding tube every 8 (eight) hours. (Patient taking differently: Place 200 mLs 3 (three) times daily into feeding tube. )  . saccharomyces boulardii (FLORASTOR) 250 MG capsule Place 1 capsule (250 mg total) into feeding tube 2 (two) times daily. (Patient not taking: Reported on 12/05/2016)    FAMILY HISTORY:  Her indicated that her mother is deceased. She indicated that her father is alive. She indicated that both of her brothers are alive. She indicated that the status of her maternal grandfather is unknown. She indicated that both of her daughters are alive.  She indicated that her son is alive. She indicated that the status of her maternal uncle is unknown.   SOCIAL HISTORY: She  reports that  has never smoked. she has never used smokeless tobacco. She reports that she drinks alcohol. She reports that she does not use drugs.  REVIEW OF SYSTEMS:   Patient unable to verbalize 2/2 trach.   SUBJECTIVE:  Patient unable to provide 2/2 trach. Smiles when asked.   VITAL SIGNS: BP 120/76   Pulse 88   Temp 98.5 F (36.9 C) (Oral)   Resp 20   Ht 5\' 2"  (1.575 m)    Wt 95 lb 14.4 oz (43.5 kg)   LMP 12/01/2010   SpO2 100%   BMI 17.54 kg/m   HEMODYNAMICS:    VENTILATOR SETTINGS: Vent Mode: PRVC FiO2 (%):  [40 %] 40 % Set Rate:  [20 bmp] 20 bmp Vt Set:  [400 mL] 400 mL PEEP:  [5 cmH20] 5 cmH20 Plateau Pressure:  [17 cmH20-18 cmH20] 17 cmH20  INTAKE / OUTPUT: I/O last 3 completed shifts: In: 5901.7 [I.V.:1376.7; NG/GT:3375; IV EQASTMHDQ:2229] Out: 7989 [Urine:3785]  PHYSICAL EXAMINATION: General:  Thin, ill appearing female.  Neuro: attends to voice.  HEENT:  Fair dentition, MMM. AT.  Cardiovascular:  RRR, no murmur Lungs:  Coarse breath sounds throughout, no wheeze.  Abdomen:  SNTND, G tube CDI Musculoskeletal:  Poor muscle mass, contractures in bilateral upper extremities, L hip and L knee Skin:  WWP  LABS:  BMET Recent Labs  Lab 12/07/16 0322 12/07/16 1033 12/08/16 0307  NA 135 130* 138  K 2.9* 3.8 3.9  CL 96* 93* 97*  CO2 31 28 31   BUN 10 10 10   CREATININE <0.30* <0.30* <0.30*  GLUCOSE 134* 121* 102*    Electrolytes Recent Labs  Lab 12/05/16 1914 12/06/16 0247 12/07/16 0322 12/07/16 1033 12/08/16 0307  CALCIUM 8.5* 8.3* 8.3* 8.1* 8.7*  MG 1.7 1.7 1.7  --   --   PHOS 3.0 3.3  --   --   --     CBC Recent Labs  Lab 12/06/16 0247 12/07/16 0322 12/08/16 0307  WBC 10.7* 10.8* 8.4  HGB 11.6* 11.5* 12.6  HCT 36.2 35.8* 39.2  PLT 273 287 316    Coag's No results for input(s): APTT, INR in the last 168 hours.  Sepsis Markers Recent Labs  Lab 12/05/16 1914 12/06/16 1126 12/06/16 1408  LATICACIDVEN 2.1* 0.9 1.2  PROCALCITON 0.15  --   --     ABG Recent Labs  Lab 12/05/16 1624  PHART 7.458*  PCO2ART 47.6  PO2ART 56.0*    Liver Enzymes Recent Labs  Lab 12/05/16 1559 12/05/16 1914  AST 148* 117*  ALT 225* 196*  ALKPHOS 130* 125  BILITOT 0.8 0.7  ALBUMIN 2.9* 2.6*    Cardiac Enzymes No results for input(s): TROPONINI, PROBNP in the last 168 hours.  Glucose Recent Labs  Lab  12/07/16 0813 12/07/16 1143 12/07/16 1547 12/07/16 1921 12/07/16 2358 12/08/16 0429  GLUCAP 158* 122* 152* 109* 145* 115*    Imaging Dg Chest Port 1 View  Result Date: 12/08/2016 CLINICAL DATA:  Healthcare associated pneumonia. EXAM: PORTABLE CHEST 1 VIEW COMPARISON:  One-view chest x-ray 12/07/2016 FINDINGS: Tracheostomy tube is stable in position. Right middle lobe airspace disease is unchanged. Left lower lobe airspace disease is improved. The left hemidiaphragm is better visualized. The upper lung fields are clear bilaterally. Degenerative changes are noted at the shoulders. IMPRESSION: 1. Improving left lower lobe airspace disease compatible  with resolving pneumonia. 2. Persistent right middle lobe airspace disease, infection or atelectasis. 3. Tracheostomy tube is in satisfactory position. Electronically Signed   By: San Morelle M.D.   On: 12/08/2016 06:44    STUDIES:  CXR 11/11 > reticular opacities, patchy opacities in bilateral bases edema vs PNA CXR 11/12 (s/p trach exchange and suction) > Tracheostomy tube appears in good position. Slight increased hazy infiltrate in the right midzone. Persistent consolidation and effusion at the left lung base.  CULTURES: MRSA PCR 11/11 > positive Blood Cultures x2 11/11 >>> NGTD <24 hrs Tracheal Aspirate Culture 11/11 >>>  Urine Streptococcal Antigen 11/11 >>> negative Urine Legionella Antigen 11/11 >>>   ANTIBIOTICS: Vancomycin 11/11 >  Meropenem 11/11 >   SIGNIFICANT EVENTS: 11/11 admit 11/12 worsening WOB and desat > trach exchanged and placed on vent support  LINES/TUBES: Trach Shiley 33mm uncuffed (replaced 11/12) G tube  14 fr foley 11/11 >  RAC IV 11/11 >   DISCUSSION: 66 y.o. female with acute on chronic hypoxic respiratory failure. Has imaging findings suggestive of an acute infectious process.  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

## 2016-12-09 ENCOUNTER — Encounter: Payer: Medicare Other | Admitting: Speech Pathology

## 2016-12-09 ENCOUNTER — Inpatient Hospital Stay (HOSPITAL_COMMUNITY): Payer: Medicare Other

## 2016-12-09 LAB — CBC
HEMATOCRIT: 40 % (ref 36.0–46.0)
HEMOGLOBIN: 12.7 g/dL (ref 12.0–15.0)
MCH: 28.9 pg (ref 26.0–34.0)
MCHC: 31.8 g/dL (ref 30.0–36.0)
MCV: 90.9 fL (ref 78.0–100.0)
Platelets: 366 10*3/uL (ref 150–400)
RBC: 4.4 MIL/uL (ref 3.87–5.11)
RDW: 16.9 % — AB (ref 11.5–15.5)
WBC: 9.4 10*3/uL (ref 4.0–10.5)

## 2016-12-09 LAB — BASIC METABOLIC PANEL
ANION GAP: 10 (ref 5–15)
BUN: 13 mg/dL (ref 6–20)
CHLORIDE: 102 mmol/L (ref 101–111)
CO2: 33 mmol/L — AB (ref 22–32)
Calcium: 8.7 mg/dL — ABNORMAL LOW (ref 8.9–10.3)
Creatinine, Ser: 0.32 mg/dL — ABNORMAL LOW (ref 0.44–1.00)
GFR calc Af Amer: >60 mL/min (ref >60)
GLUCOSE: 145 mg/dL — AB (ref 65–99)
POTASSIUM: 3.8 mmol/L (ref 3.5–5.1)
Sodium: 145 mmol/L (ref 135–145)

## 2016-12-09 LAB — VANCOMYCIN, TROUGH: Vancomycin Tr: 8 ug/mL — ABNORMAL LOW (ref 15–20)

## 2016-12-09 MED ORDER — NYSTATIN 100000 UNIT/GM EX OINT
TOPICAL_OINTMENT | Freq: Two times a day (BID) | CUTANEOUS | Status: DC
  Administered 2016-12-09: 11:00:00 via TOPICAL
  Administered 2016-12-09: 1 via TOPICAL
  Administered 2016-12-10 – 2016-12-12 (×5): via TOPICAL
  Administered 2016-12-13: 1 via TOPICAL
  Administered 2016-12-13 – 2016-12-14 (×2): via TOPICAL
  Filled 2016-12-09 (×2): qty 15

## 2016-12-09 MED ORDER — FREE WATER
200.0000 mL | Freq: Three times a day (TID) | Status: DC
  Administered 2016-12-09 – 2016-12-19 (×31): 200 mL

## 2016-12-09 MED ORDER — VANCOMYCIN HCL IN DEXTROSE 750-5 MG/150ML-% IV SOLN
750.0000 mg | Freq: Three times a day (TID) | INTRAVENOUS | Status: AC
  Administered 2016-12-09 – 2016-12-11 (×7): 750 mg via INTRAVENOUS
  Filled 2016-12-09 (×7): qty 150

## 2016-12-09 NOTE — Progress Notes (Signed)
Pharmacy Antibiotic Note  Judy Spears Judy Spears is a 66 y.o. female with a PMH of spastic tetraplegia, breast cancer, trach dependence, and PEG. Patient was admitted on 12/05/2016 with shortness of breath due to pneumonia. Pharmacy has been consulted for Vancomycin dosing.  Assessment: Chest X-Ray showed improving lung opacities. Patient has been afebrile and WBC count has been within normal limits, and stable. Patient's SCr has been stable, at 0.32, however with her tetraplegia leading to decreased muscle mass, her SCr may not be accurate. Patient's Vancomycin trough came back sub-therapeutic this morning at 8 mcg/mL. Plan to increase Vancomycin dosing; however, patient does have a history of accumulating Vancomycin.   Plan: Increase Vancomycin to 750mg  Q8H Plan for a 7 day Length of Therapy Monitor CXR, WBC, Temperature, Cultures, SCr, and Urine Output  Height: 5\' 2"  (157.5 cm) Weight: 97 lb 3.6 oz (44.1 kg) IBW/kg (Calculated) : 50.1  Temp (24hrs), Avg:98.8 F (37.1 C), Min:98.4 F (36.9 C), Max:99.1 F (37.3 C)  Recent Labs  Lab 12/05/16 1616 12/05/16 1647 12/05/16 1914 12/06/16 0247 12/06/16 1126 12/06/16 1408 12/07/16 0322 12/07/16 1033 12/08/16 0307 12/09/16 0825  WBC  --   --  13.5* 10.7*  --   --  10.8*  --  8.4 9.4  CREATININE  --   --  0.32* <0.30*  --   --  <0.30* <0.30* <0.30* 0.32*  LATICACIDVEN 1.86 1.6 2.1*  --  0.9 1.2  --   --   --   --   VANCOTROUGH  --   --   --   --   --   --   --   --   --  8*    Estimated Creatinine Clearance: 48.8 mL/min (A) (by C-G formula based on SCr of 0.32 mg/dL (L)).    Allergies  Allergen Reactions  . Cefepime Other (See Comments)    Status epilepticus- this reaction is questionable per husband.States that it was determined that pt did not have seizure activity. Status epilepticus  . Tetracyclines & Related Photosensitivity and Other (See Comments)    Blisters (childhood allergy)  . Doxycycline Monohydrate Other (See  Comments)    Blisters, photosensitivity- family state that the reaction is actually to tetracycline.  . Pollen Extract Other (See Comments)    Running nose (seasonal allergies)    Antimicrobials this admission: 11/12 Meropenem >> 11/13 11/11 Vancomycin >>   Dose adjustments this admission: 11/15 Vancomycin trough: 8 mcg/mL >> incr 750mg  q8, but will be more conservative due to history of accumulation  Microbiology results: 11/11 BCx: NGTD 3 Days 11/11Tracheal aspirate Cx: Staph Aureus (MRSA; Resistant to Ciprofloxacin, Erythromycin, Oxacillin, Clindamycin) 11/11 MRSA PCR: Positive  Thank you for allowing pharmacy to be a part of this patient's care.  Geraldo Pitter  PharmD Candidate 12/09/2016 9:50 AM   Remigio Eisenmenger D. Mina Marble, PharmD, BCPS Pager:  [phone] 12/09/2016, 12:45 PM

## 2016-12-09 NOTE — Progress Notes (Signed)
Nutrition Follow-up  DOCUMENTATION CODES:   Underweight  INTERVENTION:   Continue:   Osmolite 1.5 at 85 ml/h via PEG  Pro-stat 30 ml BID  Provides 3260 kcal, 158 gm protein, 1554 ml free water daily  Free water flushes 200 ml every 8 hours  NUTRITION DIAGNOSIS:   Increased nutrient needs related to chronic illness, wound healing as evidenced by estimated needs.  Ongoing  GOAL:   Patient will meet greater than or equal to 90% of their needs  Met  MONITOR:   TF tolerance, Vent status, Weight trends, Skin, I & O's  ASSESSMENT:   66 yo female with PMH of chronic hypoxic respiratory failure, trach & PEG, [place] encephalitis, breast CA, recently found to have R hip fx who was admitted on 11/11 with increased WOB related to PNA.  Spoke with patient's caregiver, who reports that patient has been tolerating her TF without difficulty. Calcium has been ordered. Oscal with D, 2 tablets with breakfast daily. Patient was on trach collar for a small amount of time today.  Patient is currently intubated on ventilator support MV: 10.5 L/min Temp (24hrs), Avg:98.7 F (37.1 C), Min:98.2 F (36.8 C), Max:99.1 F (37.3 C)   Labs reviewed. Medications reviewed and include Calcium, Vitamin D, vitamin C, Florastor. Patient is currently receiving Osmolite 1.5 via PEG at 85 ml/h (2040 ml/day) with Prostat 30 ml BID to provide 3260 kcals, 158 gm protein, 1554 ml free water daily.   Diet Order:  Diet NPO time specified  EDUCATION NEEDS:   No education needs have been identified at this time  Skin:  Skin Assessment: Skin Integrity Issues: Skin Integrity Issues:: Stage II Stage II: R hip  Last BM:  11/15  Height:   Ht Readings from Last 1 Encounters:  12/06/16 _0  (1.575 m)    Weight:   Wt Readings from Last 1 Encounters:  12/09/16 97 lb 3.6 oz (44.1 kg)    Ideal Body Weight:  50 kg  BMI:  Body mass index is 17.78 kg/m.  Estimated Nutritional Needs:   Kcal:   >2000  Protein:  >80 gm  Fluid:  >2 L   Molli Barrows, RD, LDN, CNSC Pager [phone] After Hours Pager [phone]

## 2016-12-09 NOTE — Progress Notes (Signed)
PULMONARY / CRITICAL CARE MEDICINE   Name: Judy Spears MRN: [id number] DOB: 1950-08-09    ADMISSION DATE:  12/05/2016 CONSULTATION DATE:  12/05/16  REFERRING MD:  EDP  CHIEF COMPLAINT:  Increased secretions and WOB  HISTORY OF PRESENT ILLNESS:   History obtained through the patient's husband at bedside given her baseline mental status. 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 Known chronic hypoxic respiratory failure. Recently diagnosed with breast cancer having undergone lumpectomy summer 2018. Lives at home with family and does not at times as well as make small attempts to verbalize. Recently she has been found to have a broken right hip and is wheelchair-bound at baseline. Presents with 1 day history of increasing purulent sputum production and increased work of breathing. Her mental status has been slightly worse than usual. No subjective fever, chills, or sweats. No recent sick contacts that the husband reports. Patient has been more restless at home recently. Her saturations have dropped into the 70s at times today prompting her husband to bring her to the emergency room. Through imaging she was subsequently found to have bibasilar infiltrates suspicious for healthcare associated pneumonia and was started on broad-spectrum antibacterial therapy.  PAST MEDICAL HISTORY :  She  has a past medical history of Arthritis, Complication of anesthesia, Hair loss, History of exercise stress test, Hyperlipidemia, Insomnia, Migraine headache, Movement disorder, Postmenopausal, [place] encephalitis, and Premature atrial contractions.  PAST SURGICAL HISTORY: She  has a past surgical history that includes Hernia repair (2000); Rhinoplasty (1983); Cesarean section (1986); opirectinal membrane peel; cataracts right eye; Breast biopsy (01/01/2011); Breast biopsy (Right, 07/27/2012); Breast biopsy (Right, 07/07/2016); Breast biopsy (Left, 06/30/2016); Tracheostomy; PEG placement; and IR  Replc Gastro/Colonic Tube Percut W/Fluoro (09/29/2016).  Allergies  Allergen Reactions  . Cefepime Other (See Comments)    Status epilepticus- this reaction is questionable per husband.States that it was determined that pt did not have seizure activity. Status epilepticus  . Tetracyclines & Related Photosensitivity and Other (See Comments)    Blisters (childhood allergy)  . Doxycycline Monohydrate Other (See Comments)    Blisters, photosensitivity- family state that the reaction is actually to tetracycline.  . Pollen Extract Other (See Comments)    Running nose (seasonal allergies)    No current facility-administered medications on file prior to encounter.    Current Outpatient Medications on File Prior to Encounter  Medication Sig  . albuterol (PROVENTIL) (2.5 MG/3ML) 0.083% nebulizer solution Take 3 mLs (2.5 mg total) by nebulization every 6 (six) hours as needed for wheezing or shortness of breath. (Patient taking differently: Take 2.5 mg every 6 (six) hours as needed by nebulization for wheezing or shortness of breath (congestion). )  . amantadine (SYMMETREL) 50 MG/5ML solution PLACE 44mL INTO FEEDING TUBE TWICE DAILY (Patient taking differently: PLACE 10 ML (100 MG)  INTO FEEDING TUBE TWICE DAILY -   8AM AND NOON)  . Amino Acids-Protein Hydrolys (FEEDING SUPPLEMENT, PRO-STAT SUGAR FREE 64,) LIQD Place 30 mLs into feeding tube 2 (two) times daily.  [name] amLODipine (NORVASC) 10 MG tablet Place 1 tablet (10 mg total) into feeding tube daily.  [name] atorvastatin (LIPITOR) 20 MG tablet Place 20 mg into feeding tube at bedtime.   . baclofen (LIORESAL) 10 MG tablet PLACE 1 TABLET INTO FEEDING TUBE EVERY 6HOURS (Patient taking differently: PLACE 1 TABLET (10 MG) INTO FEEDING TUBE FOUR TIMES DAILY)  . bethanechol (URECHOLINE) 25 MG tablet Place 1 tablet (25 mg total) into feeding tube 2 (two) times daily. FOR 2 WEEKS,  THEN DECREASE TO DAILY FOR 2 WEEKS THEN STOP (Patient taking differently: Place 25 mg  See admin instructions into feeding tube. Start date 11/25/16: give 1 tablet (25 mg) per tube twice daily for 2 weeks, then decrease to once daily for 2 weeks, then stop)  . chlorhexidine (PERIDEX) 0.12 % solution 38ml IN THE MOUTH OR THROAT TWICE DAILY (Patient taking differently: SWAB MOUTH/THROAT WITH 5 ML TWICE DAILY)  . Cholecalciferol (VITAMIN D) 2000 units CAPS Place 2,000 Units daily into feeding tube.  . clotrimazole (LOTRIMIN) 1 % cream Apply topically 2 (two) times daily. (Patient taking differently: Apply 1 application 2 (two) times daily as needed topically (rash). )  . Coenzyme Q10 (COQ10 PO) Place 10 mLs at bedtime into feeding tube.  [name] CRANBERRY JUICE EXTRACT PO Place 60 mLs daily into feeding tube.  . docusate sodium (COLACE) 100 MG capsule 100 mg See admin instructions. Give 1 capsule (100 mg) per tube twice daily  . fluticasone (FLONASE) 50 MCG/ACT nasal spray Place 2 sprays 2 (two) times daily into both nostrils.  [name] gabapentin (NEURONTIN) 100 MG capsule One capsule--open and administer via PEG twice a day. (Patient taking differently: 100 mg See admin instructions. Open one capsule (100 mg) and administer per tube twice daily - 8 am and 5pm)  . guaiFENesin (ROBITUSSIN) 100 MG/5ML SOLN Place 10 mLs (200 mg total) into feeding tube 4 (four) times daily. (Patient taking differently: Place 10 mLs See admin instructions into feeding tube. Give 10 mls (200 mg) per tube two to four times daily for congestion)  . loratadine (CLARITIN) 10 MG tablet Place 1 tablet (10 mg total) into feeding tube daily as needed for allergies. (Patient taking differently: Place 10 mg daily into feeding tube. daily)  . Melatonin 5 MG CAPS Place 5 mg at bedtime into feeding tube.   . Multiple Vitamin (MULTIVITAMIN) LIQD Place 15 mLs into feeding tube daily.  . Nutritional Supplements (FEEDING SUPPLEMENT, OSMOLITE 1.5 CAL,) LIQD Place 1,000 mLs into feeding tube daily. (Patient taking differently: Place See admin  instructions into feeding tube. 85 ml/hour for 20 hours per day)  . potassium chloride 20 MEQ/15ML (10%) SOLN Place 15 mLs (20 mEq total) into feeding tube daily.  . primidone (MYSOLINE) 50 MG tablet Place 1 tablet (50 mg total) into feeding tube 4 (four) times daily.  . Probiotic Product (PROBIOTIC PO) Place 1 capsule 2 (two) times daily into feeding tube.  . sodium chloride HYPERTONIC 3 % nebulizer solution Take by nebulization 2 (two) times daily as needed for other. (Patient taking differently: Take 4 mLs 2 (two) times daily as needed by nebulization (to loosen up chest). )  . valACYclovir (VALTREX) 500 MG tablet Place 500 mg daily into feeding tube.   . vitamin C (ASCORBIC ACID) 500 MG tablet Place 500 mg daily into feeding tube.  . Water For Irrigation, Sterile (FREE WATER) SOLN Place 200 mLs into feeding tube every 8 (eight) hours. (Patient taking differently: Place 200 mLs 3 (three) times daily into feeding tube. )  . saccharomyces boulardii (FLORASTOR) 250 MG capsule Place 1 capsule (250 mg total) into feeding tube 2 (two) times daily. (Patient not taking: Reported on 12/05/2016)    FAMILY HISTORY:  Her indicated that her mother is deceased. She indicated that her father is alive. She indicated that both of her brothers are alive. She indicated that the status of her maternal grandfather is unknown. She indicated that both of her daughters are alive. She indicated that her  son is alive. She indicated that the status of her maternal uncle is unknown.   SOCIAL HISTORY: She  reports that  has never smoked. she has never used smokeless tobacco. She reports that she drinks alcohol. She reports that she does not use drugs.  REVIEW OF SYSTEMS:   Patient unable to verbalize 2/2 trach.   SUBJECTIVE:  Patient unable to provide 2/2 trach. Smiles when asked.   VITAL SIGNS: BP (!) 169/108   Pulse 91   Temp 98.9 F (37.2 C) (Oral)   Resp (!) 28   Ht 5\' 2"  (1.575 m)   Wt 97 lb 3.6 oz (44.1  kg)   LMP 12/01/2010   SpO2 99%   BMI 17.78 kg/m   HEMODYNAMICS:    VENTILATOR SETTINGS: Vent Mode: PRVC FiO2 (%):  [30 %-40 %] 40 % Set Rate:  [20 bmp] 20 bmp Vt Set:  [400 mL-420 mL] 420 mL PEEP:  [5 cmH20] 5 cmH20 Pressure Support:  [12 cmH20] 12 cmH20 Plateau Pressure:  [21 cmH20-22 cmH20] 22 cmH20  INTAKE / OUTPUT: I/O last 3 completed shifts: In: 4476.7 [I.V.:416.7; NG/GT:3060; IV Piggyback:1000] Out: 2810 [Urine:2810]  PHYSICAL EXAMINATION: General:  Thin, ill appearing female.  Neuro: attends to voice.  HEENT:  Fair dentition, MMM. AT.  Cardiovascular:  RRR, no murmur Lungs:  Coarse breath sounds throughout, no wheeze.  Abdomen:  SNTND, G tube CDI Musculoskeletal:  Poor muscle mass, contractures in bilateral upper extremities, L hip and L knee Skin:  WWP  LABS:  BMET Recent Labs  Lab 12/07/16 0322 12/07/16 1033 12/08/16 0307  NA 135 130* 138  K 2.9* 3.8 3.9  CL 96* 93* 97*  CO2 31 28 31   BUN 10 10 10   CREATININE <0.30* <0.30* <0.30*  GLUCOSE 134* 121* 102*    Electrolytes Recent Labs  Lab 12/05/16 1914 12/06/16 0247 12/07/16 0322 12/07/16 1033 12/08/16 0307  CALCIUM 8.5* 8.3* 8.3* 8.1* 8.7*  MG 1.7 1.7 1.7  --   --   PHOS 3.0 3.3  --   --   --     CBC Recent Labs  Lab 12/06/16 0247 12/07/16 0322 12/08/16 0307  WBC 10.7* 10.8* 8.4  HGB 11.6* 11.5* 12.6  HCT 36.2 35.8* 39.2  PLT 273 287 316    Coag's No results for input(s): APTT, INR in the last 168 hours.  Sepsis Markers Recent Labs  Lab 12/05/16 1914 12/06/16 1126 12/06/16 1408  LATICACIDVEN 2.1* 0.9 1.2  PROCALCITON 0.15  --   --     ABG Recent Labs  Lab 12/05/16 1624  PHART 7.458*  PCO2ART 47.6  PO2ART 56.0*    Liver Enzymes Recent Labs  Lab 12/05/16 1559 12/05/16 1914  AST 148* 117*  ALT 225* 196*  ALKPHOS 130* 125  BILITOT 0.8 0.7  ALBUMIN 2.9* 2.6*    Cardiac Enzymes No results for input(s): TROPONINI, PROBNP in the last 168  hours.  Glucose Recent Labs  Lab 12/07/16 0813 12/07/16 1143 12/07/16 1547 12/07/16 1921 12/07/16 2358 12/08/16 0429  GLUCAP 158* 122* 152* 109* 145* 115*    Imaging No results found.  STUDIES:  CXR 11/11 > reticular opacities, patchy opacities in bilateral bases edema vs PNA CXR 11/12 (s/p trach exchange and suction) > Tracheostomy tube appears in good position. Slight increased hazy infiltrate in the right midzone. Persistent consolidation and effusion at the left lung base. CXR 11/14 - improving LLL airspace disease compatible with resolving PNA. Persistent RML airspace disease, infection vs atelectasis.  CULTURES: MRSA PCR 11/11 > positive Blood Cultures x2 11/11 >>> NGTD x3 days Tracheal Aspirate Culture 11/11 >>> MRSA  Urine Streptococcal Antigen 11/11 >>> negative Urine Legionella Antigen 11/11 >>> negative  ANTIBIOTICS: Vancomycin 11/11 >  Meropenem 11/11 > 11/13  SIGNIFICANT EVENTS: 11/11 admit 11/12 worsening WOB and desat > trach exchanged and placed on vent support  LINES/TUBES: Trach Shiley 6mm uncuffed (replaced 11/12) G tube  14 fr foley 11/11 >  RAC IV 11/11 >   DISCUSSION: 66 y.o. female with acute on chronic hypoxic respiratory failure. Has imaging findings suggestive of an acute infectious process.  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

## 2016-12-10 ENCOUNTER — Ambulatory Visit: Payer: Medicare Other | Admitting: Physical Therapy

## 2016-12-10 ENCOUNTER — Encounter: Payer: Medicare Other | Admitting: Occupational Therapy

## 2016-12-10 LAB — CBC
HEMATOCRIT: 42.5 % (ref 36.0–46.0)
HEMOGLOBIN: 13.2 g/dL (ref 12.0–15.0)
MCH: 28.5 pg (ref 26.0–34.0)
MCHC: 31.1 g/dL (ref 30.0–36.0)
MCV: 91.8 fL (ref 78.0–100.0)
Platelets: 391 10*3/uL (ref 150–400)
RBC: 4.63 MIL/uL (ref 3.87–5.11)
RDW: 17.3 % — ABNORMAL HIGH (ref 11.5–15.5)
WBC: 10.4 10*3/uL (ref 4.0–10.5)

## 2016-12-10 LAB — BASIC METABOLIC PANEL
Anion gap: 11 (ref 5–15)
BUN: 20 mg/dL (ref 6–20)
CALCIUM: 8.9 mg/dL (ref 8.9–10.3)
CO2: 33 mmol/L — ABNORMAL HIGH (ref 22–32)
Chloride: 103 mmol/L (ref 101–111)
Creatinine, Ser: 0.37 mg/dL — ABNORMAL LOW (ref 0.44–1.00)
Glucose, Bld: 137 mg/dL — ABNORMAL HIGH (ref 65–99)
POTASSIUM: 3.4 mmol/L — AB (ref 3.5–5.1)
SODIUM: 147 mmol/L — AB (ref 135–145)

## 2016-12-10 LAB — CULTURE, BLOOD (ROUTINE X 2)
Culture: NO GROWTH
Culture: NO GROWTH
Special Requests: ADEQUATE

## 2016-12-10 LAB — GLUCOSE, CAPILLARY: GLUCOSE-CAPILLARY: 120 mg/dL — AB (ref 65–99)

## 2016-12-10 MED ORDER — ACYCLOVIR 200 MG/5ML PO SUSP: 400.0000 mg | Freq: Every day | ORAL | Status: DC

## 2016-12-10 MED ORDER — AMLODIPINE BESYLATE 10 MG PO TABS
10.0000 mg | ORAL_TABLET | Freq: Every day | ORAL | Status: DC
  Administered 2016-12-10 – 2017-01-04 (×26): 10 mg
  Filled 2016-12-10 (×25): qty 1

## 2016-12-10 MED ORDER — AMLODIPINE 1 MG/ML ORAL SUSPENSION: 10.0000 mg | Freq: Every day | ORAL | Status: DC

## 2016-12-10 MED ORDER — VALACYCLOVIR HCL 500 MG PO TABS
500.0000 mg | ORAL_TABLET | Freq: Every day | ORAL | Status: DC
  Administered 2016-12-10 – 2017-01-04 (×26): 500 mg
  Filled 2016-12-10 (×26): qty 1

## 2016-12-10 NOTE — Progress Notes (Signed)
Pt transported from 11M 13 to 64M 01 with not complications.

## 2016-12-10 NOTE — Progress Notes (Signed)
PULMONARY / CRITICAL CARE MEDICINE   Name: Judy Spears MRN: [id number] DOB: 03/25/1950    ADMISSION DATE:  12/05/2016 CONSULTATION DATE:  12/05/16  REFERRING MD:  EDP  CHIEF COMPLAINT:  Increased secretions and WOB  HISTORY OF PRESENT ILLNESS:   History obtained through the patient's husband at bedside given her baseline mental status. 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 Known chronic hypoxic respiratory failure. Recently diagnosed with breast cancer having undergone lumpectomy summer 2018. Lives at home with family and does not at times as well as make small attempts to verbalize. Recently she has been found to have a broken right hip and is wheelchair-bound at baseline. Presents with 1 day history of increasing purulent sputum production and increased work of breathing. Her mental status has been slightly worse than usual. No subjective fever, chills, or sweats. No recent sick contacts that the husband reports. Patient has been more restless at home recently. Her saturations have dropped into the 70s at times today prompting her husband to bring her to the emergency room. Through imaging she was subsequently found to have bibasilar infiltrates suspicious for healthcare associated pneumonia and was started on broad-spectrum antibacterial therapy.  PAST MEDICAL HISTORY :  She  has a past medical history of Arthritis, Complication of anesthesia, Hair loss, History of exercise stress test, Hyperlipidemia, Insomnia, Migraine headache, Movement disorder, Postmenopausal, [place] encephalitis, and Premature atrial contractions.  PAST SURGICAL HISTORY: She  has a past surgical history that includes Hernia repair (2000); Rhinoplasty (1983); Cesarean section (1986); opirectinal membrane peel; cataracts right eye; Breast biopsy (01/01/2011); Breast biopsy (Right, 07/27/2012); Breast biopsy (Right, 07/07/2016); Breast biopsy (Left, 06/30/2016); Tracheostomy; PEG placement; and IR  Replc Gastro/Colonic Tube Percut W/Fluoro (09/29/2016).  Allergies  Allergen Reactions  . Cefepime Other (See Comments)    Status epilepticus- this reaction is questionable per husband.States that it was determined that pt did not have seizure activity. Status epilepticus  . Tetracyclines & Related Photosensitivity and Other (See Comments)    Blisters (childhood allergy)  . Doxycycline Monohydrate Other (See Comments)    Blisters, photosensitivity- family state that the reaction is actually to tetracycline.  . Pollen Extract Other (See Comments)    Running nose (seasonal allergies)    No current facility-administered medications on file prior to encounter.    Current Outpatient Medications on File Prior to Encounter  Medication Sig  . albuterol (PROVENTIL) (2.5 MG/3ML) 0.083% nebulizer solution Take 3 mLs (2.5 mg total) by nebulization every 6 (six) hours as needed for wheezing or shortness of breath. (Patient taking differently: Take 2.5 mg every 6 (six) hours as needed by nebulization for wheezing or shortness of breath (congestion). )  . amantadine (SYMMETREL) 50 MG/5ML solution PLACE 23mL INTO FEEDING TUBE TWICE DAILY (Patient taking differently: PLACE 10 ML (100 MG)  INTO FEEDING TUBE TWICE DAILY -   8AM AND NOON)  . Amino Acids-Protein Hydrolys (FEEDING SUPPLEMENT, PRO-STAT SUGAR FREE 64,) LIQD Place 30 mLs into feeding tube 2 (two) times daily.  [name] amLODipine (NORVASC) 10 MG tablet Place 1 tablet (10 mg total) into feeding tube daily.  [name] atorvastatin (LIPITOR) 20 MG tablet Place 20 mg into feeding tube at bedtime.   . baclofen (LIORESAL) 10 MG tablet PLACE 1 TABLET INTO FEEDING TUBE EVERY 6HOURS (Patient taking differently: PLACE 1 TABLET (10 MG) INTO FEEDING TUBE FOUR TIMES DAILY)  . bethanechol (URECHOLINE) 25 MG tablet Place 1 tablet (25 mg total) into feeding tube 2 (two) times daily. FOR 2 WEEKS,  THEN DECREASE TO DAILY FOR 2 WEEKS THEN STOP (Patient taking differently: Place 25 mg  See admin instructions into feeding tube. Start date 11/25/16: give 1 tablet (25 mg) per tube twice daily for 2 weeks, then decrease to once daily for 2 weeks, then stop)  . chlorhexidine (PERIDEX) 0.12 % solution 1ml IN THE MOUTH OR THROAT TWICE DAILY (Patient taking differently: SWAB MOUTH/THROAT WITH 5 ML TWICE DAILY)  . Cholecalciferol (VITAMIN D) 2000 units CAPS Place 2,000 Units daily into feeding tube.  . clotrimazole (LOTRIMIN) 1 % cream Apply topically 2 (two) times daily. (Patient taking differently: Apply 1 application 2 (two) times daily as needed topically (rash). )  . Coenzyme Q10 (COQ10 PO) Place 10 mLs at bedtime into feeding tube.  [name] CRANBERRY JUICE EXTRACT PO Place 60 mLs daily into feeding tube.  . docusate sodium (COLACE) 100 MG capsule 100 mg See admin instructions. Give 1 capsule (100 mg) per tube twice daily  . fluticasone (FLONASE) 50 MCG/ACT nasal spray Place 2 sprays 2 (two) times daily into both nostrils.  [name] gabapentin (NEURONTIN) 100 MG capsule One capsule--open and administer via PEG twice a day. (Patient taking differently: 100 mg See admin instructions. Open one capsule (100 mg) and administer per tube twice daily - 8 am and 5pm)  . guaiFENesin (ROBITUSSIN) 100 MG/5ML SOLN Place 10 mLs (200 mg total) into feeding tube 4 (four) times daily. (Patient taking differently: Place 10 mLs See admin instructions into feeding tube. Give 10 mls (200 mg) per tube two to four times daily for congestion)  . loratadine (CLARITIN) 10 MG tablet Place 1 tablet (10 mg total) into feeding tube daily as needed for allergies. (Patient taking differently: Place 10 mg daily into feeding tube. daily)  . Melatonin 5 MG CAPS Place 5 mg at bedtime into feeding tube.   . Multiple Vitamin (MULTIVITAMIN) LIQD Place 15 mLs into feeding tube daily.  . Nutritional Supplements (FEEDING SUPPLEMENT, OSMOLITE 1.5 CAL,) LIQD Place 1,000 mLs into feeding tube daily. (Patient taking differently: Place See admin  instructions into feeding tube. 85 ml/hour for 20 hours per day)  . potassium chloride 20 MEQ/15ML (10%) SOLN Place 15 mLs (20 mEq total) into feeding tube daily.  . primidone (MYSOLINE) 50 MG tablet Place 1 tablet (50 mg total) into feeding tube 4 (four) times daily.  . Probiotic Product (PROBIOTIC PO) Place 1 capsule 2 (two) times daily into feeding tube.  . sodium chloride HYPERTONIC 3 % nebulizer solution Take by nebulization 2 (two) times daily as needed for other. (Patient taking differently: Take 4 mLs 2 (two) times daily as needed by nebulization (to loosen up chest). )  . valACYclovir (VALTREX) 500 MG tablet Place 500 mg daily into feeding tube.   . vitamin C (ASCORBIC ACID) 500 MG tablet Place 500 mg daily into feeding tube.  . Water For Irrigation, Sterile (FREE WATER) SOLN Place 200 mLs into feeding tube every 8 (eight) hours. (Patient taking differently: Place 200 mLs 3 (three) times daily into feeding tube. )  . saccharomyces boulardii (FLORASTOR) 250 MG capsule Place 1 capsule (250 mg total) into feeding tube 2 (two) times daily. (Patient not taking: Reported on 12/05/2016)    FAMILY HISTORY:  Her indicated that her mother is deceased. She indicated that her father is alive. She indicated that both of her brothers are alive. She indicated that the status of her maternal grandfather is unknown. She indicated that both of her daughters are alive. She indicated that her  son is alive. She indicated that the status of her maternal uncle is unknown.   SOCIAL HISTORY: She  reports that  has never smoked. she has never used smokeless tobacco. She reports that she drinks alcohol. She reports that she does not use drugs.  REVIEW OF SYSTEMS:   Patient unable to verbalize 2/2 trach.   SUBJECTIVE:  Patient unable to provide 2/2 trach. Easily calmed by family.   VITAL SIGNS: BP (!) 152/89   Pulse 95   Temp (!) 102.7 F (39.3 C) (Oral) Comment: RN [name] aware   Resp (!) 27   Ht 5\' 2"   (1.575 m)   Wt 97 lb 3.6 oz (44.1 kg)   LMP 12/01/2010   SpO2 99%   BMI 17.78 kg/m   HEMODYNAMICS:    VENTILATOR SETTINGS: Vent Mode: PRVC FiO2 (%):  [30 %-60 %] 40 % Set Rate:  [20 bmp] 20 bmp Vt Set:  [420 mL] 420 mL PEEP:  [5 cmH20] 5 cmH20 Pressure Support:  [10 cmH20] 10 cmH20 Plateau Pressure:  [17 cmH20-20 cmH20] 17 cmH20  INTAKE / OUTPUT: I/O last 3 completed shifts: In: 3180 [I.V.:280; NG/GT:2550; IV Piggyback:350] Out: 2510 [Urine:2510]  PHYSICAL EXAMINATION: General:  Thin, ill appearing female.  Neuro: attends to voice.  HEENT:  Fair dentition, MMM. AT.  Cardiovascular:  RRR, no murmur Lungs:  Coarse breath sounds throughout, no wheeze.  Abdomen:  SNTND, G tube CDI Musculoskeletal:  Poor muscle mass, contractures in bilateral upper extremities, L hip and L knee Skin:  WWP  LABS:  BMET Recent Labs  Lab 12/08/16 0307 12/09/16 0825 12/10/16 0357  NA 138 145 147*  K 3.9 3.8 3.4*  CL 97* 102 103  CO2 31 33* 33*  BUN 10 13 20   CREATININE <0.30* 0.32* 0.37*  GLUCOSE 102* 145* 137*    Electrolytes Recent Labs  Lab 12/05/16 1914 12/06/16 0247 12/07/16 0322  12/08/16 0307 12/09/16 0825 12/10/16 0357  CALCIUM 8.5* 8.3* 8.3*   < > 8.7* 8.7* 8.9  MG 1.7 1.7 1.7  --   --   --   --   PHOS 3.0 3.3  --   --   --   --   --    < > = values in this interval not displayed.    CBC Recent Labs  Lab 12/08/16 0307 12/09/16 0825 12/10/16 0357  WBC 8.4 9.4 10.4  HGB 12.6 12.7 13.2  HCT 39.2 40.0 42.5  PLT 316 366 391    Coag's No results for input(s): APTT, INR in the last 168 hours.  Sepsis Markers Recent Labs  Lab 12/05/16 1914 12/06/16 1126 12/06/16 1408  LATICACIDVEN 2.1* 0.9 1.2  PROCALCITON 0.15  --   --     ABG Recent Labs  Lab 12/05/16 1624  PHART 7.458*  PCO2ART 47.6  PO2ART 56.0*    Liver Enzymes Recent Labs  Lab 12/05/16 1559 12/05/16 1914  AST 148* 117*  ALT 225* 196*  ALKPHOS 130* 125  BILITOT 0.8 0.7  ALBUMIN  2.9* 2.6*    Cardiac Enzymes No results for input(s): TROPONINI, PROBNP in the last 168 hours.  Glucose Recent Labs  Lab 12/07/16 0813 12/07/16 1143 12/07/16 1547 12/07/16 1921 12/07/16 2358 12/08/16 0429  GLUCAP 158* 122* 152* 109* 145* 115*    Imaging No results found.  STUDIES:  CXR 11/11 > reticular opacities, patchy opacities in bilateral bases edema vs PNA CXR 11/12 (s/p trach exchange and suction) > Tracheostomy tube appears in good position.  Slight increased hazy infiltrate in the right midzone. Persistent consolidation and effusion at the left lung base. CXR 11/14 - improving LLL airspace disease compatible with resolving PNA. Persistent RML airspace disease, infection vs atelectasis.   CULTURES: MRSA PCR 11/11 > positive Blood Cultures x2 11/11 >>> NGTD x3 days Tracheal Aspirate Culture 11/11 >>> MRSA  Urine Streptococcal Antigen 11/11 >>> negative Urine Legionella Antigen 11/11 >>> negative  ANTIBIOTICS: Vancomycin 11/11 >  Meropenem 11/11 > 11/13  SIGNIFICANT EVENTS: 11/11 admit 11/12 worsening WOB and desat > trach exchanged and placed on vent support  LINES/TUBES: Trach Shiley 54mm uncuffed (replaced 11/12) G tube  14 fr foley 11/11 >  RAC IV 11/11 >   DISCUSSION: 66 y.o. female with acute on chronic hypoxic respiratory failure. Has imaging findings suggestive of an acute infectious process.  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

## 2016-12-10 NOTE — Progress Notes (Signed)
Pt placed back on full vent support due to increased HR and B/P.

## 2016-12-11 ENCOUNTER — Inpatient Hospital Stay (HOSPITAL_COMMUNITY): Payer: Medicare Other

## 2016-12-11 DIAGNOSIS — J15212 Pneumonia due to Methicillin resistant Staphylococcus aureus: Secondary | ICD-10-CM

## 2016-12-11 LAB — BASIC METABOLIC PANEL
Anion gap: 8 (ref 5–15)
BUN: 26 mg/dL — AB (ref 6–20)
CALCIUM: 8.6 mg/dL — AB (ref 8.9–10.3)
CO2: 35 mmol/L — ABNORMAL HIGH (ref 22–32)
CREATININE: 0.34 mg/dL — AB (ref 0.44–1.00)
Chloride: 111 mmol/L (ref 101–111)
GFR calc Af Amer: >60 mL/min (ref >60)
GLUCOSE: 124 mg/dL — AB (ref 65–99)
POTASSIUM: 2.9 mmol/L — AB (ref 3.5–5.1)
Sodium: 154 mmol/L — ABNORMAL HIGH (ref 135–145)

## 2016-12-11 LAB — CBC
HEMATOCRIT: 45.6 % (ref 36.0–46.0)
Hemoglobin: 13.8 g/dL (ref 12.0–15.0)
MCH: 28.8 pg (ref 26.0–34.0)
MCHC: 30.3 g/dL (ref 30.0–36.0)
MCV: 95 fL (ref 78.0–100.0)
PLATELETS: 377 10*3/uL (ref 150–400)
RBC: 4.8 MIL/uL (ref 3.87–5.11)
RDW: 17.7 % — AB (ref 11.5–15.5)
WBC: 12.4 10*3/uL — ABNORMAL HIGH (ref 4.0–10.5)

## 2016-12-11 LAB — GLUCOSE, CAPILLARY
GLUCOSE-CAPILLARY: 113 mg/dL — AB (ref 65–99)
GLUCOSE-CAPILLARY: 145 mg/dL — AB (ref 65–99)
Glucose-Capillary: 166 mg/dL — ABNORMAL HIGH (ref 65–99)
Glucose-Capillary: 153 mg/dL — ABNORMAL HIGH (ref 65–99)
Glucose-Capillary: 180 mg/dL — ABNORMAL HIGH (ref 65–99)

## 2016-12-11 MED ORDER — ALBUTEROL SULFATE (2.5 MG/3ML) 0.083% IN NEBU
2.5000 mg | INHALATION_SOLUTION | Freq: Four times a day (QID) | RESPIRATORY_TRACT | Status: DC
  Administered 2016-12-11 – 2016-12-23 (×47): 2.5 mg via RESPIRATORY_TRACT
  Filled 2016-12-11 (×47): qty 3

## 2016-12-11 NOTE — Progress Notes (Signed)
PULMONARY / CRITICAL CARE MEDICINE   Name: Judy Spears MRN: [id number] DOB: [birth year]    ADMISSION DATE:  12/05/2016 CONSULTATION DATE:  12/05/16  REFERRING MD:  EDP  CHIEF COMPLAINT:  Increased secretions and WOB  HISTORY OF PRESENT ILLNESS:   66 yo female with acute on chronic respiratory failure from HCAP.  PMHx of breast cancer in Summer 2018, Rt hip fx, [place] encephalitis  SUBJECTIVE:  Tolerates some periods of pressure support and TC.  VITAL SIGNS: BP 139/87   Pulse 95   Temp 99.8 F (37.7 C) (Oral)   Resp (!) 23   Ht 5\' 2"  (1.575 m)   Wt 93 lb 4.1 oz (42.3 kg)   LMP 12/01/2010   SpO2 98%   BMI 17.06 kg/m   VENTILATOR SETTINGS: Vent Mode: PRVC FiO2 (%):  [20 %-30 %] 20 % Set Rate:  [20 bmp] 20 bmp Vt Set:  [420 mL] 420 mL PEEP:  [5 cmH20] 5 cmH20 Pressure Support:  [14 cmH20] 14 cmH20 Plateau Pressure:  [20 cmH20-23 cmH20] 20 cmH20  INTAKE / OUTPUT: I/O last 3 completed shifts: In: 3840 [I.V.:350; NG/GT:2890; IV Piggyback:600] Out: 2175 [Urine:2175]  PHYSICAL EXAMINATION:  General - alert Eyes - pupils reactive ENT - trach site clean Cardiac - regular, no murmur Chest - no wheeze, rales Abd - soft, non tender, G tube site clean Ext - decreased muscle bulk Skin - no rashes Neuro - follows simple commands   LABS:  BMET Recent Labs  Lab 12/08/16 0307 12/09/16 0825 12/10/16 0357  NA 138 145 147*  K 3.9 3.8 3.4*  CL 97* 102 103  CO2 31 33* 33*  BUN 10 13 20   CREATININE <0.30* 0.32* 0.37*  GLUCOSE 102* 145* 137*    Electrolytes Recent Labs  Lab 12/05/16 1914 12/06/16 0247 12/07/16 0322  12/08/16 0307 12/09/16 0825 12/10/16 0357  CALCIUM 8.5* 8.3* 8.3*   < > 8.7* 8.7* 8.9  MG 1.7 1.7 1.7  --   --   --   --   PHOS 3.0 3.3  --   --   --   --   --    < > = values in this interval not displayed.    CBC Recent Labs  Lab 12/08/16 0307 12/09/16 0825 12/10/16 0357  WBC 8.4 9.4 10.4  HGB 12.6 12.7 13.2  HCT  39.2 40.0 42.5  PLT 316 366 391    Coag's No results for input(s): APTT, INR in the last 168 hours.  Sepsis Markers Recent Labs  Lab 12/05/16 1914 12/06/16 1126 12/06/16 1408  LATICACIDVEN 2.1* 0.9 1.2  PROCALCITON 0.15  --   --     ABG Recent Labs  Lab 12/05/16 1624  PHART 7.458*  PCO2ART 47.6  PO2ART 56.0*    Liver Enzymes Recent Labs  Lab 12/05/16 1559 12/05/16 1914  AST 148* 117*  ALT 225* 196*  ALKPHOS 130* 125  BILITOT 0.8 0.7  ALBUMIN 2.9* 2.6*    Cardiac Enzymes No results for input(s): TROPONINI, PROBNP in the last 168 hours.  Glucose Recent Labs  Lab 12/07/16 1921 12/07/16 2358 12/08/16 0429 12/10/16 2343 12/11/16 0418 12/11/16 0817  GLUCAP 109* 145* 115* 120* 113* 166*    Imaging Dg Chest Port 1 View  Result Date: 12/11/2016 CLINICAL DATA:  Respiratory failure EXAM: PORTABLE CHEST 1 VIEW COMPARISON:  12/09/2016 FINDINGS: Tracheostomy tube remains in place, unchanged. Heart is normal size. No confluent airspace opacity. Improved aeration in the bases since prior study.  No effusions or acute bony abnormality. IMPRESSION: Improving aeration.  No active disease. Electronically Signed   By: Rolm Baptise M.D.   On: 12/11/2016 08:05   CULTURES: Tracheal Aspirate Culture 11/11 >>> MRSA   ANTIBIOTICS: Vancomycin 11/11 >  Meropenem 11/11 > 11/13  SIGNIFICANT EVENTS: 11/11 admit 11/12 worsening WOB and desat > trach exchanged and placed on vent support 11/16 transfer to vent SDU bed  LINES/TUBES: Trach Shiley 8mm uncuffed (replaced 11/12) G tube    DISCUSSION: 66 y.o. female with acute on chronic hypoxic respiratory failure. Has imaging findings suggestive of an acute infectious process.  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 Chronic tracheostomy status. - pressure support/TC as able - f/u CXR intermittently - day 7 of vancomycin  Severe protein calorie malnutrition. Dysphagia. 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 - wheelchair bound >> mobility complicated by recent hip fx - PT/OT - amantadine, baclofen, neurontin, melatonin, primidone, valtrex  Hx of HTN, HLD. - lipitor, norvasc  Hypokalemia. Hypernatremia. - f/u BMET  DVT prophylaxis - SQ heparin SUP - protonix Nutrition - tube feeds Goals of care - full code  Chesley Mires, MD [facility] 12/11/2016, 11:39 AM Pager:  [phone] After 3pm call: [phone]

## 2016-12-11 NOTE — Progress Notes (Signed)
eLink Physician-Brief Progress Note Patient Name: Judy Spears DOB: 05/31/1950 MRN: [id number]   Date of Service  12/11/2016  HPI/Events of Note  Patient has Baclofen 5 mg PO Q 6 hours ordered and was given 10 mg PO in error. The patient was on 10 mg PO Q 6 hours as an outpatient by history, therefore, this 10 mg dose should not cause any harm to the patient.   eICU Interventions  Will order: 1. Will monitor patient for signs of excessive drowsiness.      Intervention Category Major Interventions: Other:  Lysle Dingwall 12/11/2016, 7:56 PM

## 2016-12-11 NOTE — Progress Notes (Signed)
PT Cancellation Note  Patient Details Name: Judy Spears MRN: [id number] DOB: 29-Aug-1950   Cancelled Treatment:    Reason Eval/Treat Not Completed: PT screened, no needs identified, will sign off. Pt is dependent at baseline with RN reporting that she remains dependent. No acute PT needs identified at this time.    [place] 12/11/2016, 2:22 PM

## 2016-12-12 ENCOUNTER — Inpatient Hospital Stay (HOSPITAL_COMMUNITY): Payer: Medicare Other

## 2016-12-12 LAB — BASIC METABOLIC PANEL
Anion gap: 7 (ref 5–15)
BUN: 30 mg/dL — AB (ref 6–20)
CHLORIDE: 110 mmol/L (ref 101–111)
CO2: 37 mmol/L — ABNORMAL HIGH (ref 22–32)
CREATININE: 0.38 mg/dL — AB (ref 0.44–1.00)
Calcium: 8.9 mg/dL (ref 8.9–10.3)
GFR calc Af Amer: >60 mL/min (ref >60)
GFR calc non Af Amer: >60 mL/min (ref >60)
Glucose, Bld: 130 mg/dL — ABNORMAL HIGH (ref 65–99)
Potassium: 3.1 mmol/L — ABNORMAL LOW (ref 3.5–5.1)
SODIUM: 154 mmol/L — AB (ref 135–145)

## 2016-12-12 LAB — GLUCOSE, CAPILLARY
GLUCOSE-CAPILLARY: 159 mg/dL — AB (ref 65–99)
GLUCOSE-CAPILLARY: 130 mg/dL — AB (ref 65–99)
GLUCOSE-CAPILLARY: 132 mg/dL — AB (ref 65–99)
GLUCOSE-CAPILLARY: 135 mg/dL — AB (ref 65–99)
Glucose-Capillary: 124 mg/dL — ABNORMAL HIGH (ref 65–99)
Glucose-Capillary: 133 mg/dL — ABNORMAL HIGH (ref 65–99)
Glucose-Capillary: 138 mg/dL — ABNORMAL HIGH (ref 65–99)

## 2016-12-12 LAB — PROCALCITONIN: PROCALCITONIN: 0.18 ng/mL

## 2016-12-12 LAB — CBC
HCT: 47.9 % — ABNORMAL HIGH (ref 36.0–46.0)
Hemoglobin: 14.8 g/dL (ref 12.0–15.0)
MCH: 29.2 pg (ref 26.0–34.0)
MCHC: 30.9 g/dL (ref 30.0–36.0)
MCV: 94.7 fL (ref 78.0–100.0)
Platelets: 284 10*3/uL (ref 150–400)
RBC: 5.06 MIL/uL (ref 3.87–5.11)
RDW: 17.8 % — AB (ref 11.5–15.5)
WBC: 10.4 10*3/uL (ref 4.0–10.5)

## 2016-12-12 LAB — LACTIC ACID, PLASMA: LACTIC ACID, VENOUS: 1.4 mmol/L (ref 0.5–1.9)

## 2016-12-12 LAB — MAGNESIUM: Magnesium: 2.5 mg/dL — ABNORMAL HIGH (ref 1.7–2.4)

## 2016-12-12 MED ORDER — DEXTROSE 5 % IV SOLN
INTRAVENOUS | Status: DC
  Administered 2016-12-12 – 2016-12-14 (×4): via INTRAVENOUS

## 2016-12-12 MED ORDER — DEXTROSE 5 % IV SOLN
1.0000 g | Freq: Two times a day (BID) | INTRAVENOUS | Status: DC
  Filled 2016-12-12: qty 1

## 2016-12-12 MED ORDER — DOCUSATE SODIUM 50 MG/5ML PO LIQD
100.0000 mg | Freq: Two times a day (BID) | ORAL | Status: DC
  Administered 2016-12-12 – 2016-12-14 (×5): 100 mg
  Filled 2016-12-12 (×6): qty 10

## 2016-12-12 MED ORDER — FLUCONAZOLE IN SODIUM CHLORIDE 200-0.9 MG/100ML-% IV SOLN
200.0000 mg | Freq: Once | INTRAVENOUS | Status: AC
  Administered 2016-12-12: 200 mg via INTRAVENOUS
  Filled 2016-12-12: qty 100

## 2016-12-12 MED ORDER — VANCOMYCIN HCL IN DEXTROSE 750-5 MG/150ML-% IV SOLN
750.0000 mg | Freq: Three times a day (TID) | INTRAVENOUS | Status: AC
  Administered 2016-12-12 – 2016-12-18 (×19): 750 mg via INTRAVENOUS
  Filled 2016-12-12 (×19): qty 150

## 2016-12-12 MED ORDER — AMANTADINE HCL 50 MG/5ML PO SYRP
150.0000 mg | ORAL_SOLUTION | Freq: Two times a day (BID) | ORAL | Status: DC
  Administered 2016-12-12 – 2017-01-04 (×47): 150 mg
  Filled 2016-12-12 (×49): qty 15

## 2016-12-12 MED ORDER — BISACODYL 10 MG RE SUPP
10.0000 mg | Freq: Once | RECTAL | Status: AC
  Administered 2016-12-12: 10 mg via RECTAL
  Filled 2016-12-12: qty 1

## 2016-12-12 MED ORDER — IBUPROFEN 100 MG/5ML PO SUSP
400.0000 mg | Freq: Four times a day (QID) | ORAL | Status: DC | PRN
  Administered 2016-12-12: 400 mg
  Filled 2016-12-12 (×2): qty 20

## 2016-12-12 MED ORDER — POTASSIUM CHLORIDE 10 MEQ/100ML IV SOLN
10.0000 meq | INTRAVENOUS | Status: AC
  Administered 2016-12-12 (×6): 10 meq via INTRAVENOUS
  Filled 2016-12-12 (×5): qty 100

## 2016-12-12 MED ORDER — DEXTROSE 5 % IV SOLN
1.0000 g | Freq: Two times a day (BID) | INTRAVENOUS | Status: DC
  Administered 2016-12-12 – 2016-12-14 (×4): 1 g via INTRAVENOUS
  Filled 2016-12-12 (×5): qty 1

## 2016-12-12 MED ORDER — FENTANYL CITRATE (PF) 100 MCG/2ML IJ SOLN
50.0000 ug | INTRAMUSCULAR | Status: DC | PRN
  Administered 2016-12-12 – 2016-12-19 (×6): 50 ug via INTRAVENOUS
  Filled 2016-12-12 (×7): qty 2

## 2016-12-12 MED ORDER — IBUPROFEN 100 MG/5ML PO SUSP: 400.0000 mg | Freq: Four times a day (QID) | ORAL | Status: DC | PRN

## 2016-12-12 MED ORDER — FLUCONAZOLE 100MG IVPB
100.0000 mg | INTRAVENOUS | Status: DC
  Administered 2016-12-13: 100 mg via INTRAVENOUS
  Filled 2016-12-12 (×4): qty 50

## 2016-12-12 NOTE — Progress Notes (Signed)
Pharmacy Antibiotic Note  Judy Spears is a 66 y.o. female admitted on 12/05/2016 with MRSA PNA - recently completed a course of Vancomycin on 11/17. Noted to have fevers, increased secretions, WOB, and erythema around G-tube site. Pharmacy has been consulted to start empiric Vancomycin + Ceftazidime, Fluconazole has been started per MD.   Noted allergy/intolerance with Cefepime as possible status epilepticus however this appears to have been questionable and eventually was deemed not to be seizure activity. CCM wants to continue with Ceftazidime.  Plan: 1. Ceftazidime 1g IV every 12 hours 2. Restart Vancomycin 750 mg IV every 8 hours 3. Will check a VT at steady state on 11/19 4. Will continue to follow renal function, culture results, LOT, and antibiotic de-escalation plans   Height: 5\' 2"  (157.5 cm) Weight: 91 lb 0.8 oz (41.3 kg) IBW/kg (Calculated) : 50.1  Temp (24hrs), Avg:99.4 F (37.4 C), Min:98.1 F (36.7 C), Max:100.5 F (38.1 C)  Recent Labs  Lab 12/05/16 1616 12/05/16 1647 12/05/16 1914  12/06/16 1126 12/06/16 1408  12/08/16 0307 12/09/16 0825 12/10/16 0357 12/11/16 1642 12/12/16 0220  WBC  --   --  13.5*   < >  --   --    < > 8.4 9.4 10.4 12.4* 10.4  CREATININE  --   --  0.32*   < >  --   --    < > <0.30* 0.32* 0.37* 0.34* 0.38*  LATICACIDVEN 1.86 1.6 2.1*  --  0.9 1.2  --   --   --   --   --   --   VANCOTROUGH  --   --   --   --   --   --   --   --  8*  --   --   --    < > = values in this interval not displayed.    Estimated Creatinine Clearance: 45.7 mL/min (A) (by C-G formula based on SCr of 0.38 mg/dL (L)).    Allergies  Allergen Reactions  . Cefepime Other (See Comments)    Status epilepticus- this reaction is questionable per husband.States that it was determined that pt did not have seizure activity. Status epilepticus  . Tetracyclines & Related Photosensitivity and Other (See Comments)    Blisters (childhood allergy)  . Doxycycline  Monohydrate Other (See Comments)    Blisters, photosensitivity- family state that the reaction is actually to tetracycline.  . Pollen Extract Other (See Comments)    Running nose (seasonal allergies)    Antimicrobials this admission: Aztreonam x1 Vanc 11/11 >> 11/17; restart 11/18 Merrem 11/11 >> 11/13 Ceftazidime 11/18 >> Fluconazole 11/18 >>  Dose adjustments this admission: 11/15 VT = 8 mcg/mL on 500mg  q8 >> 750mg  q8   Microbiology results: 11/11 BCx - NGTD 11/11 TA - MRSA 11/11 Strep pneumo - negative 11/11 MRSA PCR - positive 11/18 BCx >> 11/18 RCx >> 11/18 UCx >>  Thank you for allowing pharmacy to be a part of this patient's care.  Alycia Rossetti, PharmD, BCPS Clinical Pharmacist Pager: [phone] Clinical phone for 12/12/2016 from 7a-3:30p: [phone] If after 3:30p, please call main pharmacy at: [phone] 12/12/2016 3:22 PM

## 2016-12-12 NOTE — Progress Notes (Signed)
eLink Physician-Brief Progress Note Patient Name: Judy Spears DOB: 1950/03/25 MRN: [id number]   Date of Service  12/12/2016  HPI/Events of Note  Fever to 100.5 F - Already on Antibiotics. AST and ALT both elevated and 2. 12 Beat run of NSVT -  K+ = 2.9 and Creatinine = 0.34 earlier today.   eICU Interventions  Will order: 1. Motrin liquid 400 mg via tube Q 6 hours PRN Temp > 100.5 F. 2. Replace K+. 3. Mg++ level STAT.     Intervention Category Major Interventions: Electrolyte abnormality - evaluation and management;Arrhythmia - evaluation and management;Infection - evaluation and management  Sommer,Steven Eugene 12/12/2016, 1:52 AM

## 2016-12-12 NOTE — Progress Notes (Signed)
Spoke w/ covering PCCM MD re:  Pt down over 8lbs since admit on 11/11 (44.5 kg-41.3kg). Family very concerned as this was an issue last admit.  Put order in for nutrition consult.    PT has signed off on pt stating pt is at baseline, however family very upset stating this is not pts baseline and requesting pt up to chair w/ PT. New PT consult order put in.   Per pts husband, pt takes Symmetrel @ 8AM and noon. Received verbal orders to change this in [facility].   Husband requesting to speak w/ covering MD. Dr. Halford Chessman states will round later today.   Relayed all to family. Family very appreciative.

## 2016-12-12 NOTE — Progress Notes (Signed)
PULMONARY / CRITICAL CARE MEDICINE   Name: Judy Spears MRN: [id number] DOB: 1950/02/18    ADMISSION DATE:  12/05/2016 CONSULTATION DATE:  12/05/16  REFERRING MD:  EDP  CHIEF COMPLAINT:  Increased secretions and WOB  HISTORY OF PRESENT ILLNESS:   66 yo female with acute on chronic respiratory failure from HCAP.  PMHx of breast cancer in Summer 2018, Rt hip fx, [place] encephalitis  SUBJECTIVE:  Progressive fever.  Not tolerating pressure support.    VITAL SIGNS: BP (!) 116/92 (BP Location: Left Arm)   Pulse 89   Temp 99.1 F (37.3 C) (Axillary)   Resp (!) 30   Ht 5\' 2"  (1.575 m)   Wt 91 lb 0.8 oz (41.3 kg)   LMP 12/01/2010   SpO2 100%   BMI 16.65 kg/m   VENTILATOR SETTINGS: Vent Mode: PRVC FiO2 (%):  [30 %] 30 % Set Rate:  [20 bmp] 20 bmp Vt Set:  [420 mL] 420 mL PEEP:  [5 cmH20] 5 cmH20 Plateau Pressure:  [14 cmH20-22 cmH20] 21 cmH20  INTAKE / OUTPUT: I/O last 3 completed shifts: In: 2045 [I.V.:110; NG/GT:1785; IV Piggyback:150] Out: 3662 [Urine:1650]  PHYSICAL EXAMINATION:  General - diaphoretic Eyes - pupils reactive ENT - trach site clean with air leak around trach Cardiac - regular, no murmur Chest - b/l rhonchi Abd - soft, erythema around G tube site Ext - no edema Skin - erythema around G tube site Neuro - moves extremities    LABS:  BMET Recent Labs  Lab 12/10/16 0357 12/11/16 1642 12/12/16 0220  NA 147* 154* 154*  K 3.4* 2.9* 3.1*  CL 103 111 110  CO2 33* 35* 37*  BUN 20 26* 30*  CREATININE 0.37* 0.34* 0.38*  GLUCOSE 137* 124* 130*    Electrolytes Recent Labs  Lab 12/05/16 1914 12/06/16 0247 12/07/16 0322  12/10/16 0357 12/11/16 1642 12/12/16 0220  CALCIUM 8.5* 8.3* 8.3*   < > 8.9 8.6* 8.9  MG 1.7 1.7 1.7  --   --   --  2.5*  PHOS 3.0 3.3  --   --   --   --   --    < > = values in this interval not displayed.    CBC Recent Labs  Lab 12/10/16 0357 12/11/16 1642 12/12/16 0220  WBC 10.4 12.4* 10.4   HGB 13.2 13.8 14.8  HCT 42.5 45.6 47.9*  PLT 391 377 284    Coag's No results for input(s): APTT, INR in the last 168 hours.  Sepsis Markers Recent Labs  Lab 12/05/16 1914 12/06/16 1126 12/06/16 1408  LATICACIDVEN 2.1* 0.9 1.2  PROCALCITON 0.15  --   --     ABG Recent Labs  Lab 12/05/16 1624  PHART 7.458*  PCO2ART 47.6  PO2ART 56.0*    Liver Enzymes Recent Labs  Lab 12/05/16 1559 12/05/16 1914  AST 148* 117*  ALT 225* 196*  ALKPHOS 130* 125  BILITOT 0.8 0.7  ALBUMIN 2.9* 2.6*    Cardiac Enzymes No results for input(s): TROPONINI, PROBNP in the last 168 hours.  Glucose Recent Labs  Lab 12/11/16 1553 12/11/16 2009 12/12/16 0028 12/12/16 0317 12/12/16 0721 12/12/16 1142  GLUCAP 145* 180* 138* 159* 133* 124*    Imaging No results found.   CULTURES: Tracheal Aspirate Culture 11/11 >>> MRSA  Blood 11/18 >> Sputum 11/18 >> Urine 11/18 >>  ANTIBIOTICS: Vancomycin 11/11 >  11/17 Meropenem 11/11 > 11/13 Diflucan 11/18 >> Vancomycin 11/18 >>  Tressie Ellis 11/18 >>  SIGNIFICANT EVENTS: 11/11 admit 11/12 worsening WOB and desat > trach exchanged and placed on vent support 11/16 transfer to vent SDU bed 11/17 Fever, increased respiratory distress, back to ICU  LINES/TUBES: Trach Shiley 25mm uncuffed (replaced 11/12) G tube    DISCUSSION: 66 yo female with acute on chronic respiratory failure from HCAP.  Had initial improvement and transferred to vent SDU.  Developed progressive fever, increase WOB, and rash around G tube site.  Transferred back to ICU.  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 Worsening respiratory distress 11/18. Chronic tracheostomy status. - full vent support - back to ICU - f/u CXR - might need to change trach if unable to get good cuff seal  Fever, erythema around G tube site, increased WOB. - restart abx >> vancomycin, fortaz, diflucan - repeat cultures - check lactic acid,  procalcitonin  Severe protein calorie malnutrition. Dysphagia. 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 - wheelchair bound >> mobility complicated by recent hip fx - PT/OT - amantadine, baclofen, neurontin, melatonin, primidone, valtrex  Hx of HTN, HLD. - lipitor, norvasc  Hypokalemia. Hypernatremia. - f/u BMET - add D5W, continue free water  DVT prophylaxis - SQ heparin SUP - protonix Nutrition - tube feeds Goals of care - full code  Updated pt's husband at bedside  CC time 32 minutes  Chesley Mires, MD [place] 12/12/2016, 2:41 PM Pager:  [phone] After 3pm call: [phone]

## 2016-12-12 NOTE — Significant Event (Signed)
Rapid Response Event Note  Overview:Called for assistance  Time Called: 1500 Arrival Time: 1503 Event Type: Respiratory  Initial Focused Assessment:  Called by Rn for assistance with patient.  Patient appears agitated and respiratory rate in the 50's with audible air leak.  RT in room and has recently lavaged patient.     Interventions:  Recommend giving some sedation to help with agitation.  Versed given and RR now in the high 20's.  Patient appears calmer.    Plan of Care (if not transferred):  Dr Halford Chessman updated.  Patient to move to ICU.  Event Summary:  RN to call if assistance needed   at      at          [facility], Harlin Rain

## 2016-12-12 NOTE — Progress Notes (Addendum)
Pts RR frequently in 50s, labored breathing, appears in distress, audible trach air leak getting louder. Suctioned several times w/ no improvement. RT called to room and was able to clear copious amounts of tan/thick sputum w/ lavage.  CCM MD, Sood, aware of declining status and has put in orders to ICU/37M. Rapid Respose nurse called to assess and assist w transfer. Now at bedside. Versed given which seems to have helped some w/ RR/distress. Blood cultures and lactic drawn.   Report called to receiving RN on 37M

## 2016-12-13 ENCOUNTER — Ambulatory Visit: Payer: Medicare Other | Admitting: Speech Pathology

## 2016-12-13 ENCOUNTER — Inpatient Hospital Stay (HOSPITAL_COMMUNITY): Payer: Medicare Other

## 2016-12-13 ENCOUNTER — Ambulatory Visit: Payer: Medicare Other | Admitting: Occupational Therapy

## 2016-12-13 ENCOUNTER — Ambulatory Visit: Payer: Medicare Other | Admitting: Physical Therapy

## 2016-12-13 LAB — GLUCOSE, CAPILLARY
GLUCOSE-CAPILLARY: 129 mg/dL — AB (ref 65–99)
GLUCOSE-CAPILLARY: 97 mg/dL (ref 65–99)
GLUCOSE-CAPILLARY: 86 mg/dL (ref 65–99)
Glucose-Capillary: 102 mg/dL — ABNORMAL HIGH (ref 65–99)
Glucose-Capillary: 107 mg/dL — ABNORMAL HIGH (ref 65–99)
Glucose-Capillary: 123 mg/dL — ABNORMAL HIGH (ref 65–99)

## 2016-12-13 LAB — COMPREHENSIVE METABOLIC PANEL
ALBUMIN: 2.5 g/dL — AB (ref 3.5–5.0)
ALT: 316 U/L — AB (ref 14–54)
AST: 132 U/L — ABNORMAL HIGH (ref 15–41)
Alkaline Phosphatase: 98 U/L (ref 38–126)
Anion gap: 6 (ref 5–15)
BILIRUBIN TOTAL: 0.8 mg/dL (ref 0.3–1.2)
BUN: 23 mg/dL — AB (ref 6–20)
CHLORIDE: 110 mmol/L (ref 101–111)
CO2: 35 mmol/L — ABNORMAL HIGH (ref 22–32)
Calcium: 8.4 mg/dL — ABNORMAL LOW (ref 8.9–10.3)
GLUCOSE: 144 mg/dL — AB (ref 65–99)
POTASSIUM: 3 mmol/L — AB (ref 3.5–5.1)
Sodium: 151 mmol/L — ABNORMAL HIGH (ref 135–145)
Total Protein: 5.8 g/dL — ABNORMAL LOW (ref 6.5–8.1)

## 2016-12-13 LAB — URINALYSIS, ROUTINE W REFLEX MICROSCOPIC
BILIRUBIN URINE: NEGATIVE
GLUCOSE, UA: NEGATIVE mg/dL
HGB URINE DIPSTICK: NEGATIVE
KETONES UR: NEGATIVE mg/dL
Leukocytes, UA: NEGATIVE
Nitrite: NEGATIVE
PH: 8 (ref 5.0–8.0)
Protein, ur: NEGATIVE mg/dL
Specific Gravity, Urine: 1.02 (ref 1.005–1.030)

## 2016-12-13 LAB — BASIC METABOLIC PANEL
Anion gap: 7 (ref 5–15)
BUN: 15 mg/dL (ref 6–20)
CALCIUM: 8.6 mg/dL — AB (ref 8.9–10.3)
CHLORIDE: 105 mmol/L (ref 101–111)
CO2: 30 mmol/L (ref 22–32)
Creatinine, Ser: 0.3 mg/dL — ABNORMAL LOW (ref 0.44–1.00)
GFR calc Af Amer: >60 mL/min (ref >60)
GFR calc non Af Amer: >60 mL/min (ref >60)
GLUCOSE: 98 mg/dL (ref 65–99)
POTASSIUM: 4.2 mmol/L (ref 3.5–5.1)
Sodium: 142 mmol/L (ref 135–145)

## 2016-12-13 LAB — CBC
HEMATOCRIT: 40.1 % (ref 36.0–46.0)
Hemoglobin: 11.8 g/dL — ABNORMAL LOW (ref 12.0–15.0)
MCH: 28.4 pg (ref 26.0–34.0)
MCHC: 29.4 g/dL — AB (ref 30.0–36.0)
MCV: 96.4 fL (ref 78.0–100.0)
PLATELETS: 259 10*3/uL (ref 150–400)
RBC: 4.16 MIL/uL (ref 3.87–5.11)
RDW: 17.7 % — AB (ref 11.5–15.5)
WBC: 9.9 10*3/uL (ref 4.0–10.5)

## 2016-12-13 LAB — VANCOMYCIN, TROUGH: VANCOMYCIN TR: 15 ug/mL (ref 15–20)

## 2016-12-13 LAB — AMYLASE: Amylase: 241 U/L — ABNORMAL HIGH (ref 28–100)

## 2016-12-13 LAB — PROCALCITONIN: PROCALCITONIN: 0.15 ng/mL

## 2016-12-13 LAB — LIPASE, BLOOD: Lipase: 30 U/L (ref 11–51)

## 2016-12-13 MED ORDER — POTASSIUM CHLORIDE 20 MEQ/15ML (10%) PO SOLN
20.0000 meq | Freq: Once | ORAL | Status: AC
  Administered 2016-12-13: 20 meq
  Filled 2016-12-13: qty 15

## 2016-12-13 MED ORDER — SENNOSIDES 8.8 MG/5ML PO SYRP
5.0000 mL | ORAL_SOLUTION | Freq: Two times a day (BID) | ORAL | Status: DC
  Administered 2016-12-13 – 2016-12-14 (×4): 5 mL
  Filled 2016-12-13 (×5): qty 5

## 2016-12-13 MED ORDER — POTASSIUM CHLORIDE 20 MEQ/15ML (10%) PO SOLN
40.0000 meq | Freq: Once | ORAL | Status: AC
  Administered 2016-12-13: 40 meq
  Filled 2016-12-13: qty 30

## 2016-12-13 MED ORDER — OSMOLITE 1.5 CAL PO LIQD
1000.0000 mL | ORAL | Status: DC
  Administered 2016-12-14 – 2017-01-04 (×34): 1000 mL
  Filled 2016-12-13 (×61): qty 1000

## 2016-12-13 NOTE — Progress Notes (Signed)
Pharmacy Antibiotic Note  Judy Spears is a 66 y.o. female admitted on 12/05/2016 with MRSA PNA - recently completed a course of Vancomycin on 11/17. Noted to have fevers, increased secretions, WOB, and erythema around G-tube site. Pharmacy has been consulted to start empiric Vancomycin + Ceftazidime, Fluconazole has been started per MD.   Noted allergy/intolerance with Cefepime as possible status epilepticus however this appears to have been questionable and eventually was deemed not to be seizure activity. CCM wants to continue with Ceftazidime.  Vancomycin 750mg  q8hr = VT 15 at goal 15-20  Plan: 1. Ceftazidime 1g IV every 12 hours 2. Continue Vancomycin 750 mg IV every 8 hours 3. Will continue to follow renal function, culture results, LOT, and antibiotic de-escalation plans   Height: 5\' 2"  (157.5 cm) Weight: 94 lb 2.2 oz (42.7 kg) IBW/kg (Calculated) : 50.1  Temp (24hrs), Avg:98.3 F (36.8 C), Min:97.8 F (36.6 C), Max:99.2 F (37.3 C)  Recent Labs  Lab 12/09/16 0825 12/10/16 0357 12/11/16 1642 12/12/16 0220 12/12/16 1605 12/13/16 0303 12/13/16 1658  WBC 9.4 10.4 12.4* 10.4  --  9.9  --   CREATININE 0.32* 0.37* 0.34* 0.38*  --  <0.30* 0.30*  LATICACIDVEN  --   --   --   --  1.4  --   --   VANCOTROUGH 8*  --   --   --   --   --  15    Estimated Creatinine Clearance: 47.3 mL/min (A) (by C-G formula based on SCr of 0.3 mg/dL (L)).    Allergies  Allergen Reactions  . Cefepime Other (See Comments)    Status epilepticus- this reaction is questionable per husband.States that it was determined that pt did not have seizure activity. Status epilepticus  . Tetracyclines & Related Photosensitivity and Other (See Comments)    Blisters (childhood allergy)  . Doxycycline Monohydrate Other (See Comments)    Blisters, photosensitivity- family state that the reaction is actually to tetracycline.  . Pollen Extract Other (See Comments)    Running nose (seasonal  allergies)    Antimicrobials this admission: Aztreonam x1 Vanc 11/11 >> 11/17; restart 11/18 Merrem 11/11 >> 11/13 Ceftazidime 11/18 >> Fluconazole 11/18 >>  Dose adjustments this admission: 11/15 VT = 8 mcg/mL on 500mg  q8 >> 750mg  q8   Microbiology results: 11/11 BCx - NGTD 11/11 TA - MRSA 11/11 Strep pneumo - negative 11/11 MRSA PCR - positive 11/18 BCx >> 11/18 RCx >> 11/18 UCx >>  Bonnita Nasuti Pharm.D. CPP, BCPS Clinical Pharmacist [phone] 12/13/2016 6:12 PM

## 2016-12-13 NOTE — Consult Note (Signed)
Reason for Consult:resp failure Referring Physician: CCM  Judy Spears is an 66 y.o. female.  HPI: hx of multiple medical problems and recent pneumonia.  She currently has been in the hospital for the respiratory distress.  She has previous respiratory issues and had tracheotomy.  She has been living with a uncuffed #4.  That was changed to a cuffed #4 recently for ventilator support.  She now is on the ventilator and it has been suggested a #6 cuffed Shiley is necessary to provide proper weaning from the ventilator.  She has had a good voice except for her dysarthria with the #4 prior to admission.  Past Medical History:  Diagnosis Date  . Arthritis    lt great toe  . Complication of anesthesia    pt has had headaches post op-did advise to hydra well preop  . Hair loss    right-sided  . History of exercise stress test    a. ETT (12/15) with 12:00 exercise, no ST changes, occasional PACs.   . Hyperlipidemia   . Insomnia   . Migraine headache   . Movement disorder    resltess in left legs  . Postmenopausal   . 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 Premature atrial contractions    a. Holter (12/15) with 8% PACs, no atrial runs or atrial fibrillation.     Past Surgical History:  Procedure Laterality Date  . BREAST BIOPSY Right 07/27/2012  . BREAST BIOPSY Right 07/07/2016  . BREAST BIOPSY Left 06/30/2016  . BREAST BIOPSY WITH NEEDLE LOCALIZATION Left 01/01/2011   Performed by Rolm Bookbinder, MD at [facility]  . cataracts right eye    . CESAREAN SECTION  1986  . HERNIA REPAIR  2000   [facility]  . IR [place] TUBE PERCUT W/FLUORO  09/29/2016  . opirectinal membrane peel    . PEG PLACEMENT    . RHINOPLASTY  1983  . TRACHEOSTOMY      Family History  Problem Relation Age of Onset  . Stroke Mother        paralyzed on right side/aphasia  . Hypertension Mother        had stroke after stopping BP meds  . Colon cancer Father   . Prostate cancer Father    . Heart attack Maternal Grandfather 56  . Stroke Maternal Uncle 51    Social History:  reports that  has never smoked. she has never used smokeless tobacco. She reports that she drinks alcohol. She reports that she does not use drugs.  Allergies:  Allergies  Allergen Reactions  . Cefepime Other (See Comments)    Status epilepticus- this reaction is questionable per husband.States that it was determined that pt did not have seizure activity. Status epilepticus  . Tetracyclines & Related Photosensitivity and Other (See Comments)    Blisters (childhood allergy)  . Doxycycline Monohydrate Other (See Comments)    Blisters, photosensitivity- family state that the reaction is actually to tetracycline.  . Pollen Extract Other (See Comments)    Running nose (seasonal allergies)    Medications: I have reviewed the patient's current medications.  Results for orders placed or performed during the hospital encounter of 12/05/16 (from the past 48 hour(s))  Glucose, capillary     Status: Abnormal   Collection Time: 12/11/16  1:06 PM  Result Value Ref Range   Glucose-Capillary 153 (H) 65 - 99 mg/dL  Glucose, capillary     Status: Abnormal   Collection Time: 12/11/16  3:53 PM  Result Value Ref Range   Glucose-Capillary 145 (H) 65 - 99 mg/dL  Basic metabolic panel     Status: Abnormal   Collection Time: 12/11/16  4:42 PM  Result Value Ref Range   Sodium 154 (H) 135 - 145 mmol/L   Potassium 2.9 (L) 3.5 - 5.1 mmol/L   Chloride 111 101 - 111 mmol/L   CO2 35 (H) 22 - 32 mmol/L   Glucose, Bld 124 (H) 65 - 99 mg/dL   BUN 26 (H) 6 - 20 mg/dL   Creatinine, Ser 0.34 (L) 0.44 - 1.00 mg/dL   Calcium 8.6 (L) 8.9 - 10.3 mg/dL   GFR calc non Af Amer >60 >60 mL/min   GFR calc Af Amer >60 >60 mL/min    Comment: (NOTE) The eGFR has been calculated using the CKD EPI equation. This calculation has not been validated in all clinical situations. eGFR's persistently <60 mL/min signify possible Chronic  Kidney Disease.    Anion gap 8 5 - 15  CBC     Status: Abnormal   Collection Time: 12/11/16  4:42 PM  Result Value Ref Range   WBC 12.4 (H) 4.0 - 10.5 K/uL   RBC 4.80 3.87 - 5.11 MIL/uL   Hemoglobin 13.8 12.0 - 15.0 g/dL   HCT 45.6 36.0 - 46.0 %   MCV 95.0 78.0 - 100.0 fL   MCH 28.8 26.0 - 34.0 pg   MCHC 30.3 30.0 - 36.0 g/dL   RDW 17.7 (H) 11.5 - 15.5 %   Platelets 377 150 - 400 K/uL  Glucose, capillary     Status: Abnormal   Collection Time: 12/11/16  8:09 PM  Result Value Ref Range   Glucose-Capillary 180 (H) 65 - 99 mg/dL  Glucose, capillary     Status: Abnormal   Collection Time: 12/12/16 12:28 AM  Result Value Ref Range   Glucose-Capillary 138 (H) 65 - 99 mg/dL  Basic metabolic panel     Status: Abnormal   Collection Time: 12/12/16  2:20 AM  Result Value Ref Range   Sodium 154 (H) 135 - 145 mmol/L   Potassium 3.1 (L) 3.5 - 5.1 mmol/L   Chloride 110 101 - 111 mmol/L   CO2 37 (H) 22 - 32 mmol/L   Glucose, Bld 130 (H) 65 - 99 mg/dL   BUN 30 (H) 6 - 20 mg/dL   Creatinine, Ser 0.38 (L) 0.44 - 1.00 mg/dL   Calcium 8.9 8.9 - 10.3 mg/dL   GFR calc non Af Amer >60 >60 mL/min   GFR calc Af Amer >60 >60 mL/min    Comment: (NOTE) The eGFR has been calculated using the CKD EPI equation. This calculation has not been validated in all clinical situations. eGFR's persistently <60 mL/min signify possible Chronic Kidney Disease.    Anion gap 7 5 - 15  CBC     Status: Abnormal   Collection Time: 12/12/16  2:20 AM  Result Value Ref Range   WBC 10.4 4.0 - 10.5 K/uL   RBC 5.06 3.87 - 5.11 MIL/uL   Hemoglobin 14.8 12.0 - 15.0 g/dL   HCT 47.9 (H) 36.0 - 46.0 %   MCV 94.7 78.0 - 100.0 fL   MCH 29.2 26.0 - 34.0 pg   MCHC 30.9 30.0 - 36.0 g/dL   RDW 17.8 (H) 11.5 - 15.5 %   Platelets 284 150 - 400 K/uL  Magnesium     Status: Abnormal   Collection Time: 12/12/16  2:20 AM  Result Value Ref  Range   Magnesium 2.5 (H) 1.7 - 2.4 mg/dL  Glucose, capillary     Status: Abnormal    Collection Time: 12/12/16  3:17 AM  Result Value Ref Range   Glucose-Capillary 159 (H) 65 - 99 mg/dL   Comment 1 Notify RN   Glucose, capillary     Status: Abnormal   Collection Time: 12/12/16  7:21 AM  Result Value Ref Range   Glucose-Capillary 133 (H) 65 - 99 mg/dL  Glucose, capillary     Status: Abnormal   Collection Time: 12/12/16 11:42 AM  Result Value Ref Range   Glucose-Capillary 124 (H) 65 - 99 mg/dL  Procalcitonin - Baseline     Status: None   Collection Time: 12/12/16  4:03 PM  Result Value Ref Range   Procalcitonin 0.18 ng/mL    Comment:        Interpretation: PCT (Procalcitonin) <= 0.5 ng/mL: Systemic infection (sepsis) is not likely. Local bacterial infection is possible. (NOTE)         ICU PCT Algorithm               Non ICU PCT Algorithm    ----------------------------     ------------------------------         PCT < 0.25 ng/mL                 PCT < 0.1 ng/mL     Stopping of antibiotics            Stopping of antibiotics       strongly encouraged.               strongly encouraged.    ----------------------------     ------------------------------       PCT level decrease by               PCT < 0.25 ng/mL       >= 80% from peak PCT       OR PCT 0.25 - 0.5 ng/mL          Stopping of antibiotics                                             encouraged.     Stopping of antibiotics           encouraged.    ----------------------------     ------------------------------       PCT level decrease by              PCT >= 0.25 ng/mL       < 80% from peak PCT        AND PCT >= 0.5 ng/mL            Continuin g antibiotics                                              encouraged.       Continuing antibiotics            encouraged.    ----------------------------     ------------------------------     PCT level increase compared          PCT > 0.5 ng/mL         with peak PCT AND  PCT >= 0.5 ng/mL             Escalation of antibiotics                                           strongly encouraged.      Escalation of antibiotics        strongly encouraged.   Culture, blood (Routine X 2) w Reflex to ID Panel     Status: None (Preliminary result)   Collection Time: 12/12/16  4:05 PM  Result Value Ref Range   Specimen Description BLOOD LEFT WRIST    Special Requests IN PEDIATRIC BOTTLE Blood Culture adequate volume    Culture NO GROWTH < 24 HOURS    Report Status PENDING   Lactic acid, plasma     Status: None   Collection Time: 12/12/16  4:05 PM  Result Value Ref Range   Lactic Acid, Venous 1.4 0.5 - 1.9 mmol/L  Culture, blood (Routine X 2) w Reflex to ID Panel     Status: None (Preliminary result)   Collection Time: 12/12/16  4:10 PM  Result Value Ref Range   Specimen Description BLOOD RIGHT HAND    Special Requests IN PEDIATRIC BOTTLE Blood Culture adequate volume    Culture NO GROWTH < 24 HOURS    Report Status PENDING   Glucose, capillary     Status: Abnormal   Collection Time: 12/12/16  4:35 PM  Result Value Ref Range   Glucose-Capillary 130 (H) 65 - 99 mg/dL  Glucose, capillary     Status: Abnormal   Collection Time: 12/12/16  5:32 PM  Result Value Ref Range   Glucose-Capillary 132 (H) 65 - 99 mg/dL  Glucose, capillary     Status: Abnormal   Collection Time: 12/12/16  8:26 PM  Result Value Ref Range   Glucose-Capillary 135 (H) 65 - 99 mg/dL   Comment 1 Notify RN   Glucose, capillary     Status: Abnormal   Collection Time: 12/13/16 12:12 AM  Result Value Ref Range   Glucose-Capillary 123 (H) 65 - 99 mg/dL   Comment 1 Notify RN   Procalcitonin     Status: None   Collection Time: 12/13/16  3:03 AM  Result Value Ref Range   Procalcitonin 0.15 ng/mL    Comment:        Interpretation: PCT (Procalcitonin) <= 0.5 ng/mL: Systemic infection (sepsis) is not likely. Local bacterial infection is possible. (NOTE)         ICU PCT Algorithm               Non ICU PCT Algorithm    ----------------------------     ------------------------------          PCT < 0.25 ng/mL                 PCT < 0.1 ng/mL     Stopping of antibiotics            Stopping of antibiotics       strongly encouraged.               strongly encouraged.    ----------------------------     ------------------------------       PCT level decrease by               PCT < 0.25 ng/mL       >=  80% from peak PCT       OR PCT 0.25 - 0.5 ng/mL          Stopping of antibiotics                                             encouraged.     Stopping of antibiotics           encouraged.    ----------------------------     ------------------------------       PCT level decrease by              PCT >= 0.25 ng/mL       < 80% from peak PCT        AND PCT >= 0.5 ng/mL            Continuin g antibiotics                                              encouraged.       Continuing antibiotics            encouraged.    ----------------------------     ------------------------------     PCT level increase compared          PCT > 0.5 ng/mL         with peak PCT AND          PCT >= 0.5 ng/mL             Escalation of antibiotics                                          strongly encouraged.      Escalation of antibiotics        strongly encouraged.   Comprehensive metabolic panel     Status: Abnormal   Collection Time: 12/13/16  3:03 AM  Result Value Ref Range   Sodium 151 (H) 135 - 145 mmol/L   Potassium 3.0 (L) 3.5 - 5.1 mmol/L   Chloride 110 101 - 111 mmol/L   CO2 35 (H) 22 - 32 mmol/L   Glucose, Bld 144 (H) 65 - 99 mg/dL   BUN 23 (H) 6 - 20 mg/dL   Creatinine, Ser <0.30 (L) 0.44 - 1.00 mg/dL   Calcium 8.4 (L) 8.9 - 10.3 mg/dL   Total Protein 5.8 (L) 6.5 - 8.1 g/dL   Albumin 2.5 (L) 3.5 - 5.0 g/dL   AST 132 (H) 15 - 41 U/L   ALT 316 (H) 14 - 54 U/L   Alkaline Phosphatase 98 38 - 126 U/L   Total Bilirubin 0.8 0.3 - 1.2 mg/dL   GFR calc non Af Amer NOT CALCULATED >60 mL/min   GFR calc Af Amer NOT CALCULATED >60 mL/min    Comment: (NOTE) The eGFR has been calculated using the  CKD EPI equation. This calculation has not been validated in all clinical situations. eGFR's persistently <60 mL/min signify possible Chronic Kidney Disease.    Anion gap 6 5 - 15  CBC     Status: Abnormal   Collection Time: 12/13/16  3:03 AM  Result Value Ref Range  WBC 9.9 4.0 - 10.5 K/uL   RBC 4.16 3.87 - 5.11 MIL/uL   Hemoglobin 11.8 (L) 12.0 - 15.0 g/dL    Comment: REPEATED TO VERIFY DELTA CHECK NOTED    HCT 40.1 36.0 - 46.0 %   MCV 96.4 78.0 - 100.0 fL   MCH 28.4 26.0 - 34.0 pg   MCHC 29.4 (L) 30.0 - 36.0 g/dL   RDW 17.7 (H) 11.5 - 15.5 %   Platelets 259 150 - 400 K/uL  Glucose, capillary     Status: Abnormal   Collection Time: 12/13/16  4:18 AM  Result Value Ref Range   Glucose-Capillary 129 (H) 65 - 99 mg/dL   Comment 1 Notify RN   Glucose, capillary     Status: Abnormal   Collection Time: 12/13/16  7:41 AM  Result Value Ref Range   Glucose-Capillary 107 (H) 65 - 99 mg/dL   Comment 1 Capillary Specimen    Comment 2 Notify RN   Culture, respiratory (NON-Expectorated)     Status: None (Preliminary result)   Collection Time: 12/13/16  9:50 AM  Result Value Ref Range   Specimen Description TRACHEAL ASPIRATE    Special Requests Normal    Gram Stain      ABUNDANT WBC PRESENT, PREDOMINANTLY MONONUCLEAR RARE SQUAMOUS EPITHELIAL CELLS PRESENT MODERATE GRAM POSITIVE COCCI IN CLUSTERS IN CHAINS    Culture PENDING    Report Status PENDING   Urinalysis, Routine w reflex microscopic     Status: Abnormal   Collection Time: 12/13/16 10:08 AM  Result Value Ref Range   Color, Urine YELLOW YELLOW   APPearance CLOUDY (A) CLEAR   Specific Gravity, Urine 1.020 1.005 - 1.030   pH 8.0 5.0 - 8.0   Glucose, UA NEGATIVE NEGATIVE mg/dL   Hgb urine dipstick NEGATIVE NEGATIVE   Bilirubin Urine NEGATIVE NEGATIVE   Ketones, ur NEGATIVE NEGATIVE mg/dL   Protein, ur NEGATIVE NEGATIVE mg/dL   Nitrite NEGATIVE NEGATIVE   Leukocytes, UA NEGATIVE NEGATIVE  Amylase     Status: Abnormal    Collection Time: 12/13/16 10:11 AM  Result Value Ref Range   Amylase 241 (H) 28 - 100 U/L  Lipase, blood     Status: None   Collection Time: 12/13/16 10:11 AM  Result Value Ref Range   Lipase 30 11 - 51 U/L  Glucose, capillary     Status: Abnormal   Collection Time: 12/13/16 11:33 AM  Result Value Ref Range   Glucose-Capillary 102 (H) 65 - 99 mg/dL   Comment 1 Capillary Specimen    Comment 2 Notify RN     Dg Chest Port 1 View  Result Date: 12/13/2016 CLINICAL DATA:  Respiratory failure EXAM: PORTABLE CHEST 1 VIEW COMPARISON:  12/12/2016 FINDINGS: Tracheostomy remains in good position. Decreased lung volume since the prior study with increase in bibasilar atelectasis. Negative for heart failure or effusion IMPRESSION: Hypoventilation with interval increase in bibasilar atelectasis. Tracheostomy remains in good position. Electronically Signed   By: Franchot Gallo M.D.   On: 12/13/2016 07:13   Dg Chest Port 1 View  Result Date: 12/12/2016 CLINICAL DATA:  Tachypnea EXAM: PORTABLE CHEST 1 VIEW COMPARISON:  Yesterday FINDINGS: Tracheostomy in place. Normal heart size and mediastinal contours. Percutaneous gastrostomy tube is present. Possible emphysema. There is no edema, consolidation, effusion, or pneumothorax. Nipple shadow on the left. IMPRESSION: No evidence of acute disease.  Stable from yesterday. Electronically Signed   By: Monte Fantasia M.D.   On: 12/12/2016 15:12  Review of Systems  Constitutional: Negative.   HENT: Negative.   Skin: Negative.    Blood pressure (!) 166/103, pulse 88, temperature 98 F (36.7 C), temperature source Oral, resp. rate 13, height _0  (1.575 m), weight 42.7 kg (94 lb 2.2 oz), last menstrual period 12/01/2010, SpO2 100 %. Physical Exam  HENT:  Awake but on the ventilator.  There does not sound like there is any air leak.  The tidal volume seems to be appropriate.  The trach has some blood in the suction but it is patent and there is no active  bleeding.  There is no blood on the trach sponge.  No neck swelling.    Assessment/Plan: Respiratory failure-she has a #4cuffed Shiley and currently is doing well on the ventilator.  There does not appear or sound like there is any leak.  I discussed this with the family and they are very apprehensive about wanting to take her to the operating room to change to a #6.  I discussed this with CCM and they feel like weaning there may be a significant problem.  We discussed the procedure of changing it that might require a general anesthesia if depending on how much needed to be done to get the #6 Shiley in.  The husband is not present and I will come back later to discuss it with him at the request of the family.  A decision will then be made at some point.  [name] Montane 12/13/2016, 12:24 PM

## 2016-12-13 NOTE — Progress Notes (Signed)
PULMONARY / CRITICAL CARE MEDICINE   Name: Judy Spears MRN: [id number] DOB: [birth year]    ADMISSION DATE:  12/05/2016 CONSULTATION DATE:  12/05/16  REFERRING MD:  EDP  CHIEF COMPLAINT:  Increased secretions and WOB  HISTORY OF PRESENT ILLNESS:   66 yo female with acute on chronic respiratory failure from HCAP.  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   REVIEW OF SYSTEMS  Cannot participate 2/2 trach.   VITAL SIGNS: BP (!) 145/80   Pulse 94   Temp 98 F (36.7 C) (Oral)   Resp (!) 27   Ht 5\' 2"  (1.575 m)   Wt 94 lb 2.2 oz (42.7 kg)   LMP 12/01/2010   SpO2 96%   BMI 17.22 kg/m   VENTILATOR SETTINGS: Vent Mode: PRVC FiO2 (%):  [30 %] 30 % Set Rate:  [20 bmp] 20 bmp Vt Set:  [420 mL] 420 mL PEEP:  [5 cmH20] 5 cmH20 Plateau Pressure:  [21 cmH20-22 cmH20] 22 cmH20  INTAKE / OUTPUT: I/O last 3 completed shifts: In: 4505 [I.V.:1115; Other:60; NG/GT:3230; IV Piggyback:100] Out: 2050 [Urine:2050]  PHYSICAL EXAMINATION:  General - diaphoretic ENT - trach site clean with air leak around trach Cardiac - regular, no murmur Chest - b/l rhonchi Abd - soft, erythema around G tube site (stable from 11/16) Ext - no edema Skin - erythema around G tube site Neuro - moves extremities  LABS:  BMET Recent Labs  Lab 12/11/16 1642 12/12/16 0220 12/13/16 0303  NA 154* 154* 151*  K 2.9* 3.1* 3.0*  CL 111 110 110  CO2 35* 37* 35*  BUN 26* 30* 23*  CREATININE 0.34* 0.38* <0.30*  GLUCOSE 124* 130* 144*    Electrolytes Recent Labs  Lab 12/07/16 0322  12/11/16 1642 12/12/16 0220 12/13/16 0303  CALCIUM 8.3*   < > 8.6* 8.9 8.4*  MG 1.7  --   --  2.5*  --    < > = values in this interval not displayed.    CBC Recent Labs  Lab 12/11/16 1642 12/12/16 0220 12/13/16 0303  WBC 12.4* 10.4 9.9  HGB 13.8 14.8 11.8*  HCT 45.6 47.9* 40.1  PLT 377 284  259    Coag's No results for input(s): APTT, INR in the last 168 hours.  Sepsis Markers Recent Labs  Lab 12/06/16 1126 12/06/16 1408 12/12/16 1603 12/12/16 1605 12/13/16 0303  LATICACIDVEN 0.9 1.2  --  1.4  --   PROCALCITON  --   --  0.18  --  0.15    ABG No results for input(s): PHART, PCO2ART, PO2ART in the last 168 hours.  Liver Enzymes Recent Labs  Lab 12/13/16 0303  AST 132*  ALT 316*  ALKPHOS 98  BILITOT 0.8  ALBUMIN 2.5*    Cardiac Enzymes No results for input(s): TROPONINI, PROBNP in the last 168 hours.  Glucose Recent Labs  Lab 12/12/16 1142 12/12/16 1635 12/12/16 1732 12/12/16 2026 12/13/16 0012 12/13/16 0418  GLUCAP 124* 130* 132* 135* 123* 129*    Imaging Dg Chest Port 1 View  Result Date: 12/13/2016 CLINICAL DATA:  Respiratory failure EXAM: PORTABLE CHEST 1 VIEW COMPARISON:  12/12/2016 FINDINGS: Tracheostomy remains in good position. Decreased lung volume since the prior study with increase in bibasilar atelectasis. Negative for heart failure or effusion IMPRESSION: Hypoventilation with interval increase in bibasilar atelectasis. Tracheostomy remains in good position. Electronically Signed   By:  Franchot Gallo M.D.   On: 12/13/2016 07:13   Dg Chest Port 1 View  Result Date: 12/12/2016 CLINICAL DATA:  Tachypnea EXAM: PORTABLE CHEST 1 VIEW COMPARISON:  Yesterday FINDINGS: Tracheostomy in place. Normal heart size and mediastinal contours. Percutaneous gastrostomy tube is present. Possible emphysema. There is no edema, consolidation, effusion, or pneumothorax. Nipple shadow on the left. IMPRESSION: No evidence of acute disease.  Stable from yesterday. Electronically Signed   By: Monte Fantasia M.D.   On: 12/12/2016 15:12     CULTURES: Tracheal Aspirate Culture 11/11 >>> MRSA  Blood 11/18 >> Sputum 11/18 >> Urine 11/18 >>  ANTIBIOTICS: Vancomycin 11/11 >  11/17 Meropenem 11/11 > 11/13 Diflucan 11/18 >> Vancomycin 11/18 >>  Tressie Ellis 11/18 >>    SIGNIFICANT EVENTS: 11/11 admit 11/12 worsening WOB and desat > trach exchanged and placed on vent support 11/16 transfer to vent SDU bed 11/17 Fever, increased respiratory distress, back to ICU  LINES/TUBES: Trach Shiley 68mm uncuffed (replaced 11/12) G tube   DISCUSSION: 66 yo female with acute on chronic respiratory failure from HCAP.  Had initial improvement and transferred to vent SDU.  Developed progressive fever, increase WOB, and rash around G tube site.  Transferred back to ICU.  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 Worsening respiratory distress 11/18. Chronic tracheostomy status. CXR with hypoventilation and increased atelectasis.  - full vent support - likely need to change trach 2/2 lung volumes and WOB, consulted ENT this am  Fever, erythema around G tube site, increased WOB. Lactic acid and pro calcitonin unchanged from presentation. Will evaluate additional abdominal etiologies with lipase, amylase, RUQ [place], UA.  - continue vancomycin, fortaz, diflucan - monitor repeat cultures - f/u RUQ [place], lipase, amylase, UA  Severe protein calorie malnutrition. Dysphagia. 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 - wheelchair bound >> mobility complicated by recent hip fx - PT/OT - amantadine, baclofen, neurontin, melatonin, primidone, valtrex  Hx of HTN, HLD. - lipitor, norvasc  Hypokalemia. Hypernatremia. - f/u BMET - add D5W, continue free water  DVT prophylaxis - SQ heparin SUP - protonix Nutrition - tube feeds Goals of care - full code  Ralene Ok, MD PGY2 FM Resident 7:18 AM

## 2016-12-13 NOTE — Progress Notes (Addendum)
Nutrition Follow-up  DOCUMENTATION CODES:   Underweight  INTERVENTION:    Increase Osmolite 1.5 to 90 ml/h (2160 ml)  Continue Pro-stat 30 ml BID  Provides 3440 kcal, 165 gm protein, 1646 ml free water daily  Also, recommend increasing free water flushes to 250 ml QID as volume status allows  NUTRITION DIAGNOSIS:   Increased nutrient needs related to chronic illness, wound healing as evidenced by estimated needs.  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  Discussed patient in ICU rounds and with RN today. Received consult regarding family's concern about patient's weight loss during this hospitalization. Discussed concerns with family (daughter, caregiver, & friend) and decided that we could increase TF rate to 90 ml/h to increase kcal and protein intake. Daily weights were likely different over the weekend due to change in beds when transferred to 53M, then back to 57M. Weight loss could also be related to volume depletion as sodium is elevated and decreased movement with recent hip fx. Suspect she also has increased energy expenditure with spastic movements and acute PNA. Patient had no BM for 4 days per caregiver. Colace was re-started yesterday and she had a BM last night and this morning. Family is hoping for patient to return to her previous physical status to gain muscle strength back with PT. Unsure if this is a realistic goal as patient seems to be progressively losing lean body mass with decline in overall medical status.  Plans for abdominal ultrasound today to evaluate potential cause of rash around PEG site. TF currently on hold. Labs  reviewed. Sodium 151 (H), Potassium 3 (L) Medications reviewed and include Calcium, Vitamin D, Colace, Florastor, Vitamin C.  Diet Order:  Diet NPO time specified  EDUCATION NEEDS:   No education needs have been identified at this time  Skin:  Skin Assessment: Skin Integrity Issues: Skin Integrity Issues:: Stage II Stage II: R hip  Last BM:  11/19  Height:   Ht Readings from Last 1 Encounters:  12/06/16 '5\' 2"'$  (1.575 m)    Weight:   Wt Readings from Last 1 Encounters:  12/13/16 94 lb 2.2 oz (42.7 kg)    Ideal Body Weight:  50 kg  BMI:  Body mass index is 17.22 kg/m.  Estimated Nutritional Needs:   Kcal:  >2000  Protein:  >80 gm  Fluid:  >2 L   Molli Barrows, RD, LDN, CNSC Pager [phone] After Hours Pager [phone]

## 2016-12-13 NOTE — Progress Notes (Signed)
Subjective/Chief Complaint: Here to discuss with husband   Objective: Vital signs in last 24 hours: Temp:  [97.8 F (36.6 C)-100.1 F (37.8 C)] 97.8 F (36.6 C) (11/19 1540) Pulse Rate:  [76-102] 83 (11/19 1612) Resp:  [13-40] 25 (11/19 1612) BP: (97-168)/(34-123) 163/91 (11/19 1612) SpO2:  [96 %-100 %] 100 % (11/19 1612) FiO2 (%):  [30 %] 30 % (11/19 1612) Weight:  [42.7 kg (94 lb 2.2 oz)] 42.7 kg (94 lb 2.2 oz) (11/19 0500) Last BM Date: 12/13/16  Intake/Output from previous day: 11/18 0701 - 11/19 0700 In: 4665 [I.V.:1190; NG/GT:3315; IV Piggyback:100] Out: 1700 [Urine:1700] Intake/Output this shift: Total I/O In: 1014 [I.V.:675; NG/GT:289; IV Piggyback:50] Out: 456 [Urine:455; Stool:1]  no change  Lab Results:  Recent Labs    12/12/16 0220 12/13/16 0303  WBC 10.4 9.9  HGB 14.8 11.8*  HCT 47.9* 40.1  PLT 284 259   BMET Recent Labs    12/12/16 0220 12/13/16 0303  NA 154* 151*  K 3.1* 3.0*  CL 110 110  CO2 37* 35*  GLUCOSE 130* 144*  BUN 30* 23*  CREATININE 0.38* <0.30*  CALCIUM 8.9 8.4*   PT/INR No results for input(s): LABPROT, INR in the last 72 hours. ABG No results for input(s): PHART, HCO3 in the last 72 hours.  Invalid input(s): PCO2, PO2  Studies/Results: Dg Chest Port 1 View  Result Date: 12/13/2016 CLINICAL DATA:  Respiratory failure EXAM: PORTABLE CHEST 1 VIEW COMPARISON:  12/12/2016 FINDINGS: Tracheostomy remains in good position. Decreased lung volume since the prior study with increase in bibasilar atelectasis. Negative for heart failure or effusion IMPRESSION: Hypoventilation with interval increase in bibasilar atelectasis. Tracheostomy remains in good position. Electronically Signed   By: Franchot Gallo M.D.   On: 12/13/2016 07:13   Dg Chest Port 1 View  Result Date: 12/12/2016 CLINICAL DATA:  Tachypnea EXAM: PORTABLE CHEST 1 VIEW COMPARISON:  Yesterday FINDINGS: Tracheostomy in place. Normal heart size and mediastinal  contours. Percutaneous gastrostomy tube is present. Possible emphysema. There is no edema, consolidation, effusion, or pneumothorax. Nipple shadow on the left. IMPRESSION: No evidence of acute disease.  Stable from yesterday. Electronically Signed   By: Monte Fantasia M.D.   On: 12/12/2016 15:12    Anti-infectives: Anti-infectives (From admission, onward)   Start     Dose/Rate Route Frequency Ordered Stop   12/13/16 1500  fluconazole (DIFLUCAN) IVPB 100 mg     100 mg 50 mL/hr over 60 Minutes Intravenous Every 24 hours 12/12/16 1445     12/12/16 1800  vancomycin (VANCOCIN) IVPB 750 mg/150 ml premix     750 mg 150 mL/hr over 60 Minutes Intravenous Every 8 hours 12/12/16 1521     12/12/16 1600  cefTAZidime (FORTAZ) 1 g in dextrose 5 % 50 mL IVPB  Status:  Discontinued     1 g 100 mL/hr over 30 Minutes Intravenous Every 12 hours 12/12/16 1521 12/12/16 1525   12/12/16 1600  cefTAZidime (FORTAZ) 1 g in dextrose 5 % 50 mL IVPB     1 g 100 mL/hr over 30 Minutes Intravenous Every 12 hours 12/12/16 1539     12/12/16 1445  fluconazole (DIFLUCAN) IVPB 200 mg     200 mg 100 mL/hr over 60 Minutes Intravenous  Once 12/12/16 1445 12/12/16 1801   12/10/16 1800  acyclovir (ZOVIRAX) 200 MG/5ML suspension SUSP 400 mg  Status:  Discontinued    Comments:  Patient's original dose 500mg .  Can we adjust dosing to equal 500mg ?  400 mg Per Tube Daily 12/10/16 1647 12/10/16 1652   12/10/16 1800  valACYclovir (VALTREX) tablet 500 mg     500 mg Per Tube Daily 12/10/16 1653     12/09/16 1600  vancomycin (VANCOCIN) IVPB 750 mg/150 ml premix     750 mg 150 mL/hr over 60 Minutes Intravenous Every 8 hours 12/09/16 1023 12/11/16 1811   12/06/16 0130  vancomycin (VANCOCIN) 500 mg in sodium chloride 0.9 % 100 mL IVPB  Status:  Discontinued     500 mg 100 mL/hr over 60 Minutes Intravenous Every 8 hours 12/05/16 1809 12/09/16 1023   12/06/16 0000  meropenem (MERREM) 1 g in sodium chloride 0.9 % 100 mL IVPB  Status:   Discontinued     1 g 200 mL/hr over 30 Minutes Intravenous Every 8 hours 12/05/16 1809 12/07/16 1339   12/05/16 1700  vancomycin (VANCOCIN) IVPB 1000 mg/200 mL premix     1,000 mg 200 mL/hr over 60 Minutes Intravenous  Once 12/05/16 1654 12/05/16 1924   12/05/16 1645  vancomycin (VANCOCIN) IVPB 1000 mg/200 mL premix  Status:  Discontinued     1,000 mg 200 mL/hr over 60 Minutes Intravenous  Once 12/05/16 1635 12/05/16 1636   12/05/16 1645  aztreonam (AZACTAM) 2 g in dextrose 5 % 50 mL IVPB  Status:  Discontinued     2 g 100 mL/hr over 30 Minutes Intravenous  Once 12/05/16 1640 12/05/16 1816      Assessment/Plan: s/p Procedure(s) with comments: TRACHEAL DILITATION (N/A) - Trache change with stoma dilation I discussed with husband about the trach change to 6 requested by CCM. He is interested in the upsize. We discussed the procedure and risks,benefits, and options. All questions were answered and consent obtained. Will plan on Wednesday in AM.   LOS: 8 days    [name] Montane 12/13/2016

## 2016-12-13 NOTE — Progress Notes (Signed)
CPT was stopped after 5 minutes due to increased RR (>40, Increased HR). RN and MD made aware

## 2016-12-13 NOTE — Progress Notes (Signed)
Patient seen for trach team follow up.  All needed equipment at the bedside.  No education needed at this time.  Will continue to follow for progression.  

## 2016-12-13 NOTE — Progress Notes (Signed)
[facility] Progress Note Patient Name: Judy Spears DOB: 12/07/1950 MRN: [id number]   Date of Service  12/13/2016  HPI/Events of Note  K+ = 3.0 and Creatinine < 0.30.  eICU Interventions  Will replace K+.     Intervention Category Major Interventions: Electrolyte abnormality - evaluation and management  [name] 12/13/2016, 5:33 AM

## 2016-12-13 NOTE — Evaluation (Signed)
Physical Therapy Evaluation Patient Details Name: Judy Spears MRN: [id number] DOB: 1950-03-21 Today's Date: 12/13/2016   History of Present Illness  66 yo female with acute on chronic respiratory failure from HCAP. 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 Had initial improvement and transferred to vent SDU. Developed progressive fever, increase WOB, and rash around G tube site. Transferred back to ICU.  Clinical Impression  Orders received for PT evaluation. Patient demonstrates deficits in functional mobility as indicated below. Will benefit from continued skilled PT to address deficits and maximize function. Will see as indicated and progress as tolerated.   OF NOTE: patient dependent at baseline BUT is able to transfer OOB to wheel chair via stand pivot with family, family desires for this to continue. Will trial PT to maximize function and decrease burden of care.    Follow Up Recommendations Home health PT;Supervision/Assistance - 24 hour    Equipment Recommendations  (hoyer lift)    Recommendations for Other Services       Precautions / Restrictions Precautions Precautions: Fall Precaution Comments: vent/trach Restrictions Weight Bearing Restrictions: No      Mobility  Bed Mobility Overal bed mobility: Needs Assistance Bed Mobility: Rolling Rolling: Max assist;+2 for physical assistance;+2 for safety/equipment            Transfers Overall transfer level: Needs assistance Equipment used: (Lateral draw sheet transfer) Transfers: Lateral/Scoot Transfers          Lateral/Scoot Transfers: +2 safety/equipment;+2 physical assistance;Total assist General transfer comment: perfromed a 3 person lateral draw sheet transfer from bed to drop arm recliner, then repositioned for comfort.  Ambulation/Gait             General Gait Details: non ambulatory  Stairs            Wheelchair Mobility    Modified  Rankin (Stroke Patients Only)       Balance                                             Pertinent Vitals/Pain Pain Assessment: No/denies pain    Home Living Family/patient expects to be discharged to:: Private residence Living Arrangements: Spouse/significant other Available Help at Discharge: Family;Personal care attendant;Available 24 hours/day Type of Home: House Home Access: Ramped entrance     Home Layout: Two level;Bed/bath upstairs Home Equipment: Hospital bed;Wheelchair - manual;Tub bench;Hand held shower head;Bedside commode;Walker - 2 wheels(shower W/C, Charlaine Dalton) Additional Comments: Pt's husband provided all info about home     Prior Function Level of Independence: Needs assistance   Gait / Transfers Assistance Needed: Had been using stedy the last couple of months to get to [facility]. Is WC (tilt in space) dependent at baseline  ADL's / Homemaking Assistance Needed: max assist with bathing, dressing        Hand Dominance   Dominant Hand: Right    Extremity/Trunk Assessment   Upper Extremity Assessment Upper Extremity Assessment: Defer to OT evaluation    Lower Extremity Assessment Lower Extremity Assessment: Generalized weakness;RLE deficits/detail;LLE deficits/detail RLE Deficits / Details: AROM noted, but less compared to LLE unsure if limited by pain(Per chart, R hip fx at baseline) RLE Coordination: decreased fine motor;decreased gross motor LLE Deficits / Details: some AROM noted, arthritic changes and contractures obvious  LLE Coordination: decreased fine motor;decreased gross motor    Cervical / Trunk Assessment  Cervical / Trunk Assessment: Kyphotic  Communication   Communication: Tracheostomy  Cognition Arousal/Alertness: Awake/alert Behavior During Therapy: Restless Overall Cognitive Status: Difficult to assess                                        General Comments      Exercises     Assessment/Plan     PT Assessment Patient needs continued PT services  PT Problem List         PT Treatment Interventions Functional mobility training;Therapeutic activities;Therapeutic exercise;Balance training;Patient/family education    PT Goals (Current goals can be found in the Care Plan section)  Acute Rehab PT Goals Patient Stated Goal: to be able to pivot transfer from bed to wheel chair PT Goal Formulation: With family Time For Goal Achievement: 12/27/16 Potential to Achieve Goals: Fair    Frequency Min 2X/week   Barriers to discharge        Co-evaluation               AM-PAC PT "6 Clicks" Daily Activity  Outcome Measure Difficulty turning over in bed (including adjusting bedclothes, sheets and blankets)?: Unable Difficulty moving from lying on back to sitting on the side of the bed? : Unable Difficulty sitting down on and standing up from a chair with arms (e.g., wheelchair, bedside commode, etc,.)?: Unable Help needed moving to and from a bed to chair (including a wheelchair)?: Total Help needed walking in hospital room?: Total Help needed climbing 3-5 steps with a railing? : Total 6 Click Score: 6    End of Session Equipment Utilized During Treatment: (Vent) Activity Tolerance: Patient tolerated treatment well     PT Visit Diagnosis: Muscle weakness (generalized) (M62.81);Adult, failure to thrive (R62.7);Pain Pain - Right/Left: Right Pain - part of body: Hip    Time: 0814-4818 PT Time Calculation (min) (ACUTE ONLY): 19 min   Charges:   PT Evaluation $PT Eval Moderate Complexity: 1 Mod     PT G Codes:        Alben Deeds, PT DPT  Board Certified Neurologic Specialist [place] 12/13/2016, 1:15 PM

## 2016-12-14 LAB — BASIC METABOLIC PANEL
Anion gap: 8 (ref 5–15)
BUN: 13 mg/dL (ref 6–20)
CHLORIDE: 98 mmol/L — AB (ref 101–111)
CO2: 31 mmol/L (ref 22–32)
Calcium: 8.4 mg/dL — ABNORMAL LOW (ref 8.9–10.3)
Glucose, Bld: 80 mg/dL (ref 65–99)
POTASSIUM: 3.7 mmol/L (ref 3.5–5.1)
SODIUM: 137 mmol/L (ref 135–145)

## 2016-12-14 LAB — CBC WITH DIFFERENTIAL/PLATELET
BASOS PCT: 0 %
Basophils Absolute: 0 10*3/uL (ref 0.0–0.1)
Eosinophils Absolute: 0.2 10*3/uL (ref 0.0–0.7)
Eosinophils Relative: 3 %
HEMATOCRIT: 37.8 % (ref 36.0–46.0)
HEMOGLOBIN: 11.6 g/dL — AB (ref 12.0–15.0)
LYMPHS PCT: 21 %
Lymphs Abs: 1.8 10*3/uL (ref 0.7–4.0)
MCH: 29.2 pg (ref 26.0–34.0)
MCHC: 30.7 g/dL (ref 30.0–36.0)
MCV: 95.2 fL (ref 78.0–100.0)
MONOS PCT: 5 %
Monocytes Absolute: 0.5 10*3/uL (ref 0.1–1.0)
NEUTROS ABS: 6.2 10*3/uL (ref 1.7–7.7)
NEUTROS PCT: 71 %
Platelets: 231 10*3/uL (ref 150–400)
RBC: 3.97 MIL/uL (ref 3.87–5.11)
RDW: 17.2 % — ABNORMAL HIGH (ref 11.5–15.5)
WBC: 8.7 10*3/uL (ref 4.0–10.5)

## 2016-12-14 LAB — COMPREHENSIVE METABOLIC PANEL
ALBUMIN: 2.5 g/dL — AB (ref 3.5–5.0)
ALT: 212 U/L — ABNORMAL HIGH (ref 14–54)
ANION GAP: 7 (ref 5–15)
AST: 72 U/L — AB (ref 15–41)
Alkaline Phosphatase: 100 U/L (ref 38–126)
BILIRUBIN TOTAL: 0.7 mg/dL (ref 0.3–1.2)
BUN: 17 mg/dL (ref 6–20)
CHLORIDE: 102 mmol/L (ref 101–111)
CO2: 32 mmol/L (ref 22–32)
Calcium: 8.4 mg/dL — ABNORMAL LOW (ref 8.9–10.3)
Creatinine, Ser: 0.31 mg/dL — ABNORMAL LOW (ref 0.44–1.00)
GFR calc Af Amer: >60 mL/min (ref >60)
GFR calc non Af Amer: >60 mL/min (ref >60)
GLUCOSE: 131 mg/dL — AB (ref 65–99)
POTASSIUM: 3.7 mmol/L (ref 3.5–5.1)
SODIUM: 141 mmol/L (ref 135–145)
TOTAL PROTEIN: 5.2 g/dL — AB (ref 6.5–8.1)

## 2016-12-14 LAB — MAGNESIUM: Magnesium: 1.9 mg/dL (ref 1.7–2.4)

## 2016-12-14 LAB — PROCALCITONIN: PROCALCITONIN: 0.14 ng/mL

## 2016-12-14 LAB — URINE CULTURE: Culture: NO GROWTH

## 2016-12-14 LAB — PHOSPHORUS: Phosphorus: 3.2 mg/dL (ref 2.5–4.6)

## 2016-12-14 LAB — GLUCOSE, CAPILLARY: GLUCOSE-CAPILLARY: 115 mg/dL — AB (ref 65–99)

## 2016-12-14 MED ORDER — DEXTROSE 5 % IV SOLN
1.0000 g | Freq: Three times a day (TID) | INTRAVENOUS | Status: AC
  Administered 2016-12-14 – 2016-12-18 (×14): 1 g via INTRAVENOUS
  Filled 2016-12-14 (×14): qty 1

## 2016-12-14 MED ORDER — SODIUM CHLORIDE 3 % IN NEBU
4.0000 mL | INHALATION_SOLUTION | Freq: Two times a day (BID) | RESPIRATORY_TRACT | Status: DC
  Administered 2016-12-15 – 2017-01-04 (×32): 4 mL via RESPIRATORY_TRACT
  Filled 2016-12-14 (×39): qty 4

## 2016-12-14 MED ORDER — NYSTATIN 100000 UNIT/GM EX POWD
Freq: Two times a day (BID) | CUTANEOUS | Status: DC
  Administered 2016-12-14 – 2017-01-04 (×43): via TOPICAL
  Filled 2016-12-14: qty 15

## 2016-12-14 NOTE — Progress Notes (Signed)
Physical Therapy Treatment Patient Details Name: Judy Spears MRN: [id number] DOB: Mar 06, 1950 Today's Date: 12/14/2016    History of Present Illness 66 yo female with acute on chronic respiratory failure from HCAP. 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 Had initial improvement and transferred to vent SDU. Developed progressive fever, increase WOB, and rash around G tube site. Transferred back to ICU.    PT Comments    PTA caregivers report that patient was transferring with sit <> stand pivot. Attempted to perform today during session x3. Patient requiring total assist for all aspects of transfer.  Recommending hoyer lift upon discharge.  Follow Up Recommendations  Home health PT;Supervision/Assistance - 24 hour     Equipment Recommendations  (hoyer lift)    Recommendations for Other Services       Precautions / Restrictions Precautions Precautions: Fall Precaution Comments: vent/trach Restrictions Weight Bearing Restrictions: No    Mobility  Bed Mobility Overal bed mobility: Needs Assistance Bed Mobility: Rolling Rolling: Max assist;+2 for physical assistance;+2 for safety/equipment            Transfers Overall transfer level: Needs assistance Equipment used: (Lateral draw sheet transfer) Transfers: Sit to/from Stand;Stand Pivot Transfers Sit to Stand: +2 safety/equipment;+2 physical assistance;Total assist Stand pivot transfers: +2 safety/equipment;+2 physical assistance;Total assist       General transfer comment: Performed total assist sit <> stand x3 then total assist trnasfer OOB to chair  Ambulation/Gait             General Gait Details: non ambulatory   Stairs            Wheelchair Mobility    Modified Rankin (Stroke Patients Only)       Balance Overall balance assessment: Needs assistance   Sitting balance-Leahy Scale: Zero       Standing balance-Leahy Scale: Zero                               Cognition Arousal/Alertness: Awake/alert Behavior During Therapy: Restless Overall Cognitive Status: Difficult to assess                                        Exercises      General Comments        Pertinent Vitals/Pain      Home Living                      Prior Function            PT Goals (current goals can now be found in the care plan section) Acute Rehab PT Goals Patient Stated Goal: to be able to pivot transfer from bed to wheel chair PT Goal Formulation: With family Time For Goal Achievement: 12/27/16 Potential to Achieve Goals: Fair Progress towards PT goals: Progressing toward goals    Frequency    Min 2X/week      PT Plan Current plan remains appropriate    Co-evaluation              AM-PAC PT "6 Clicks" Daily Activity  Outcome Measure  Difficulty turning over in bed (including adjusting bedclothes, sheets and blankets)?: Unable Difficulty moving from lying on back to sitting on the side of the bed? : Unable Difficulty sitting down on and standing up from a chair with  arms (e.g., wheelchair, bedside commode, etc,.)?: Unable Help needed moving to and from a bed to chair (including a wheelchair)?: Total Help needed walking in hospital room?: Total Help needed climbing 3-5 steps with a railing? : Total 6 Click Score: 6    End of Session Equipment Utilized During Treatment: (Vent) Activity Tolerance: Patient tolerated treatment well Patient left: in chair;with call bell/phone within reach;with family/visitor present Nurse Communication: Mobility status PT Visit Diagnosis: Muscle weakness (generalized) (M62.81);Adult, failure to thrive (R62.7);Pain Pain - Right/Left: Right Pain - part of body: Hip     Time: 0160-1093 PT Time Calculation (min) (ACUTE ONLY): 27 min  Charges:  $Therapeutic Activity: 23-37 mins                    G Codes:       Alben Deeds, PT DPT  Board  Certified Neurologic Specialist [place] 12/14/2016, 12:15 PM

## 2016-12-14 NOTE — Progress Notes (Signed)
[facility] Attending Note  Physician Requesting Consult:Peter Messick, M.D. 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 Known chronic hypoxic respiratory failure. Recently diagnosed with breast cancer having undergone lumpectomy summer 2018. Lives at home with family and does not at times as well as make small attempts to verbalize. Recently she has been found to have a broken right hip and is wheelchair-bound at baseline. Presents with 1 day history of increasing purulent sputum production and increased work of breathing. Her mental status has been slightly worse than usual. No subjective fever, chills, or sweats. No recent sick contacts that the husband reports. Patient has been more restless at home recently. Her saturations have dropped into the 70s at times today prompting her husband to bring her to the emergency room. Through imaging she was subsequently found to have bibasilar infiltrates suspicious for healthcare associated pneumonia and was started on broad-spectrum antiviral therapy.  Subjective:  No acute events overnight. Patient remains on ventilator support. Is having some intermittent bloody secretions with endotracheal suctioning.  Review of Systems:  Unable to obtain given intubation.   Vent Mode: PRVC FiO2 (%):  [30 %] 30 % Set Rate:  [20 bmp] 20 bmp Vt Set:  [420 mL] 420 mL PEEP:  [5 cmH20] 5 cmH20 Pressure Support:  [8 cmH20] 8 cmH20 Plateau Pressure:  [18 cmH20-22 cmH20] 20 cmH20  Temp:  [97.6 F (36.4 C)-98.5 F (36.9 C)] 98.5 F (36.9 C) (11/20 1503) Pulse Rate:  [66-111] 85 (11/20 1522) Resp:  [18-37] 31 (11/20 1522) BP: (91-198)/(62-168) 143/73 (11/20 1522) SpO2:  [90 %-100 %] 100 % (11/20 1503) FiO2 (%):  [30 %] 30 % (11/20 1523) Weight:  [100 lb 5 oz (45.5 kg)] 100 lb 5 oz (45.5 kg) (11/20 0424)   General:  Husband at bedside.  Awake. No distress. Integument:  Warm. Try. Minimal patchy erythematous rash around PEG tube site. HEENT:  Moist mucus memebranes. No scleral icterus. Tracheostomy in place. Neurological:  Pupils symmetric. Drugs to questions. Attends to voice.  Pulmonary:  Symmetric chest wall rise on ventilator. Coarse breath sounds bilaterally. Cardiovascular:  Regular rhythm. No JVD appreciated. Abdomen:  Soft. Nondistended. Normoactive bowel sounds. PEG tube without any surrounding discharge.  LINES/TUBES: Trach #4 (changed to cuffed 11/12) >>> PEG >>> Foley >>> PIV  CBC Latest Ref Rng & Units 12/14/2016 12/13/2016 12/12/2016  WBC 4.0 - 10.5 K/uL 8.7 9.9 10.4  Hemoglobin 12.0 - 15.0 g/dL 11.6(L) 11.8(L) 14.8  Hematocrit 36.0 - 46.0 % 37.8 40.1 47.9(H)  Platelets 150 - 400 K/uL 231 259 284    BMP Latest Ref Rng & Units 12/14/2016 12/13/2016 12/13/2016  Glucose 65 - 99 mg/dL 131(H) 98 144(H)  BUN 6 - 20 mg/dL 17 15 23(H)  Creatinine 0.44 - 1.00 mg/dL 0.31(L) 0.30(L) <0.30(L)  BUN/Creat Ratio 12 - 28 - - -  Sodium 135 - 145 mmol/L 141 142 151(H)  Potassium 3.5 - 5.1 mmol/L 3.7 4.2 3.0(L)  Chloride 101 - 111 mmol/L 102 105 110  CO2 22 - 32 mmol/L 32 30 35(H)  Calcium 8.9 - 10.3 mg/dL 8.4(L) 8.6(L) 8.4(L)    Hepatic Function Latest Ref Rng & Units 12/14/2016 12/13/2016 12/05/2016  Total Protein 6.5 - 8.1 g/dL 5.2(L) 5.8(L) 6.0(L)  Albumin 3.5 - 5.0 g/dL 2.5(L) 2.5(L) 2.6(L)  AST 15 - 41 U/L 72(H) 132(H) 117(H)  ALT 14 - 54 U/L 212(H) 316(H) 196(H)  Alk  Phosphatase 38 - 126 U/L 100 98 125  Total Bilirubin 0.3 - 1.2 mg/dL 0.7 0.8 0.7  Bilirubin, Direct 0.0 - 0.3 mg/dL - - -   IMAGING/STUDIES: PORT CXR 11/11: Previously reviewed by me. And reticular opacities predominantly in the lung bases with some areas of alveolar filling suggestive of a pneumonitis. PORT CXR 11/19:  Previously reviewed by me. Questionable new right lower lung zone opacity. No pleural effusion appreciated. RUQ U/S  11/19:  Unremarkable right upper quadrant ultrasound.  MICROBIOLOGY: MRSA PCR 11/11:  Positive  Blood Cultures x2 11/11:  Negative  Tracheal Aspirate Culture 11/11:  MRSA Urine Streptococcal Antigen 11/11:  Negative Urine Legionella Antigen 11/11:  Negative  Urine Culture 11/18:  Negative  Blood Cultures x2 11/18 >>> Tracheal Aspirate Culture 11/18 >>> Few Staph auerus  ANTIBIOTICS: Merrem 11/11 - 11/13 Diflucan 11/18 - 11/20 Nystatin Powder 11/20 >>> Vancomycin 11/11 - 11/17; 11/18 >>> Tressie Ellis 11/18 >>>  SIGNIFICANT EVENTS: 11/11 - Admit 11/12 - Worsening work of breathing & desat >> trach changed to cuffed & placed on full vent support 11/16 - Transfer to vent SDU bed 11/17 - Transferred back to ICU w/ fever  ASSESSMENT/PLAN:  66 y.o. female with chronic tracheostomy and acute on chronic hypoxic respiratory failure. Patient continuing to have significant secretions. Tracheal aspirate culture is still growing Staphylococcus aureus which is perplexing given her prior history of treatment. This could be a colonizer and the organism precipitating her acute decompensation may be masked.  1. Acute on chronic hypoxic respiratory failure: Continuing ventilator weaning with FiO2. Plan for tracheostomy upsizing to #6 tomorrow. 2. Sepsis: Possible new healthcare associated pneumonia. Continuing empiric vancomycin and Fortaz. Discontinuing Diflucan. Trending Procalcitonin per algorithm. Awaiting finalization of cultures. 3. MRSA pneumonia: Awaiting repeat tracheal aspirate culture result. Continuing vancomycin for now. 4. Hypernatremia: Resolved. Discontinuing D5W. Continuing free water via the tube. Monitoring electrolytes twice daily. 5. Transaminitis: Improving. Monitoring hepatic function panel intermittently. 6. Severe protein-calorie malnutrition: Continuing tube feedings per dietary recommendations. 7. History of encephalitis: Currently wheelchair-bound. PT/OT consulted. Continuing  baclofen, Neurontin, melatonin, primidone, Valtrex, and amantadine. 8. Essential hypertension: Continuing Norvasc. Monitoring vitals per unit protocol. 9. Hyperlipidemia: Continuing on Lipitor. 10. Hypokalemia: Resolved.  Prophylaxis:  Heparin Harrison q8hr & Protonix via tube. .  Diet:  NPO. Continuing Tube Feedings. Code Status:  Full Code. Disposition:  Remains critically ill in the ICU. Family Update:  Husband updated during rounds today.  I have personally spent a total of 33 minutes of critical care time today caring for the patient, updating family at bedside, & reviewing the patient's electronic medical record.  Sonia Baller Ashok Cordia, M.D. [facility] Pager:  [phone] After 7pm or if no response, call [phone] 4:33 PM 12/14/16

## 2016-12-14 NOTE — Progress Notes (Signed)
Pharmacy Antibiotic Note  Judy Spears is a 66 y.o. female admitted on 12/05/2016 with s/p treatment for MRSA pneumonia and repeat respiratory culture with few staph aureus.  Pharmacy has been consulted for Vancomycin and Ceftazidime dosing. Vancomycin trough is therapeutic. SCr is low in setting of low muscle mass. Patient is making good urine.   Plan: Continue Vancomycin 750mg  IV every 8 hours Increase Ceftazidime to 1g IV every 8 hours Monitor renal function, culture result, and clinical status Follow-up ability to narrow based on susceptibilties  Height: 5\' 2"  (157.5 cm) Weight: 100 lb 5 oz (45.5 kg) IBW/kg (Calculated) : 50.1  Temp (24hrs), Avg:98.1 F (36.7 C), Min:97.6 F (36.4 C), Max:98.5 F (36.9 C)  Recent Labs  Lab 12/09/16 0825 12/10/16 0357 12/11/16 1642 12/12/16 0220 12/12/16 1605 12/13/16 0303 12/13/16 1658 12/14/16 0304  WBC 9.4 10.4 12.4* 10.4  --  9.9  --  8.7  CREATININE 0.32* 0.37* 0.34* 0.38*  --  <0.30* 0.30* 0.31*  LATICACIDVEN  --   --   --   --  1.4  --   --   --   VANCOTROUGH 8*  --   --   --   --   --  15  --     Estimated Creatinine Clearance: 50.4 mL/min (A) (by C-G formula based on SCr of 0.31 mg/dL (L)).    Allergies  Allergen Reactions  . Cefepime Other (See Comments)    Status epilepticus- this reaction is questionable per husband.States that it was determined that pt did not have seizure activity. Status epilepticus  . Tetracyclines & Related Photosensitivity and Other (See Comments)    Blisters (childhood allergy)  . Doxycycline Monohydrate Other (See Comments)    Blisters, photosensitivity- family state that the reaction is actually to tetracycline.  . Pollen Extract Other (See Comments)    Running nose (seasonal allergies)    Antimicrobials this admission: Aztreonam x1 Vanc 11/11>>11/17; restart 11/18 >> Merrem 11/11>>11/13 Ceftazidime 11/18 >> Fluconazole 11/18 >>  Dose adjustments this admission: 11/15  VT = 8 mcg/mL on 500mg  q8 >> 750mg  q8 11/19 VT = 15 mcg/mL on 750mg  q8h  Microbiology results: 11/11BCx -negative 11/11TA - MRSA 11/11 Strep pneumo - negative 11/11 MRSA PCR - positive 11/18 BCx >> 11/18 RCx >> 11/18 UCx >> 11/19 Resp >> few staph aureus  Thank you for allowing pharmacy to be a part of this patient's care.  Sloan Leiter, PharmD, BCPS, BCCCP Clinical Pharmacist Clinical phone 12/14/2016 until 3:[phone] After hours, please call [phone] 12/14/2016 4:05 PM

## 2016-12-14 NOTE — Progress Notes (Signed)
Patient unable to do chest PT through the bed due to being up in chair. RT will continue to monitor.

## 2016-12-15 ENCOUNTER — Encounter: Payer: Medicare Other | Admitting: Occupational Therapy

## 2016-12-15 ENCOUNTER — Inpatient Hospital Stay (HOSPITAL_COMMUNITY): Payer: Medicare Other | Admitting: Certified Registered Nurse Anesthetist

## 2016-12-15 ENCOUNTER — Inpatient Hospital Stay (HOSPITAL_COMMUNITY): Payer: Medicare Other

## 2016-12-15 ENCOUNTER — Encounter (HOSPITAL_COMMUNITY)
Admission: EM | Disposition: A | Discharge status: Rehabilitation Facility | Payer: Self-pay | Source: Home / Self Care | Attending: Pulmonary Disease

## 2016-12-15 ENCOUNTER — Ambulatory Visit: Payer: Medicare Other | Admitting: Physical Therapy

## 2016-12-15 HISTORY — PX: TRACHEOSTOMY TUBE PLACEMENT: SHX814

## 2016-12-15 LAB — BASIC METABOLIC PANEL
Anion gap: 7 (ref 5–15)
BUN: 10 mg/dL (ref 6–20)
CALCIUM: 8.2 mg/dL — AB (ref 8.9–10.3)
CO2: 31 mmol/L (ref 22–32)
CREATININE: 0.31 mg/dL — AB (ref 0.44–1.00)
Chloride: 101 mmol/L (ref 101–111)
GFR calc Af Amer: >60 mL/min (ref >60)
GLUCOSE: 140 mg/dL — AB (ref 65–99)
POTASSIUM: 3 mmol/L — AB (ref 3.5–5.1)
SODIUM: 139 mmol/L (ref 135–145)

## 2016-12-15 LAB — CBC WITH DIFFERENTIAL/PLATELET
BASOS ABS: 0 10*3/uL (ref 0.0–0.1)
BASOS PCT: 0 %
EOS ABS: 0.1 10*3/uL (ref 0.0–0.7)
Eosinophils Relative: 2 %
HEMATOCRIT: 36.7 % (ref 36.0–46.0)
HEMOGLOBIN: 11.5 g/dL — AB (ref 12.0–15.0)
Lymphocytes Relative: 13 %
Lymphs Abs: 1.1 10*3/uL (ref 0.7–4.0)
MCH: 28.8 pg (ref 26.0–34.0)
MCHC: 31.3 g/dL (ref 30.0–36.0)
MCV: 92 fL (ref 78.0–100.0)
MONO ABS: 0.4 10*3/uL (ref 0.1–1.0)
Monocytes Relative: 5 %
NEUTROS ABS: 6.8 10*3/uL (ref 1.7–7.7)
NEUTROS PCT: 80 %
Platelets: 252 10*3/uL (ref 150–400)
RBC: 3.99 MIL/uL (ref 3.87–5.11)
RDW: 16.9 % — AB (ref 11.5–15.5)
WBC: 8.5 10*3/uL (ref 4.0–10.5)

## 2016-12-15 LAB — CULTURE, RESPIRATORY

## 2016-12-15 LAB — CULTURE, RESPIRATORY W GRAM STAIN: Special Requests: NORMAL

## 2016-12-15 LAB — RENAL FUNCTION PANEL
ALBUMIN: 2.7 g/dL — AB (ref 3.5–5.0)
Anion gap: 9 (ref 5–15)
BUN: 13 mg/dL (ref 6–20)
CALCIUM: 8.5 mg/dL — AB (ref 8.9–10.3)
CO2: 30 mmol/L (ref 22–32)
Chloride: 98 mmol/L — ABNORMAL LOW (ref 101–111)
Creatinine, Ser: <0.3 mg/dL — ABNORMAL LOW (ref 0.44–1.00)
Glucose, Bld: 95 mg/dL (ref 65–99)
PHOSPHORUS: 3.7 mg/dL (ref 2.5–4.6)
POTASSIUM: 3.6 mmol/L (ref 3.5–5.1)
SODIUM: 137 mmol/L (ref 135–145)

## 2016-12-15 LAB — MAGNESIUM: MAGNESIUM: 2.1 mg/dL (ref 1.7–2.4)

## 2016-12-15 SURGERY — CREATION, TRACHEOSTOMY
Anesthesia: Monitor Anesthesia Care | Site: Neck

## 2016-12-15 MED ORDER — PROPOFOL 10 MG/ML IV BOLUS
INTRAVENOUS | Status: AC
  Filled 2016-12-15: qty 20

## 2016-12-15 MED ORDER — PROPOFOL 500 MG/50ML IV EMUL
INTRAVENOUS | Status: DC | PRN
  Administered 2016-12-15: 25 ug/kg/min via INTRAVENOUS

## 2016-12-15 MED ORDER — PROPOFOL 10 MG/ML IV BOLUS
INTRAVENOUS | Status: DC | PRN
  Administered 2016-12-15 (×3): 50 mg via INTRAVENOUS

## 2016-12-15 MED ORDER — MIDAZOLAM HCL 2 MG/2ML IJ SOLN
INTRAMUSCULAR | Status: AC
  Filled 2016-12-15: qty 2

## 2016-12-15 MED ORDER — DOCUSATE SODIUM 50 MG/5ML PO LIQD
100.0000 mg | Freq: Every day | ORAL | Status: DC
  Administered 2016-12-15 – 2017-01-04 (×14): 100 mg
  Filled 2016-12-15 (×14): qty 10

## 2016-12-15 MED ORDER — MIDAZOLAM HCL 2 MG/2ML IJ SOLN
INTRAMUSCULAR | Status: AC
  Administered 2016-12-15: 1 mg
  Filled 2016-12-15: qty 2

## 2016-12-15 MED ORDER — LIDOCAINE-EPINEPHRINE 1 %-1:100000 IJ SOLN
INTRAMUSCULAR | Status: AC
  Filled 2016-12-15: qty 1

## 2016-12-15 MED ORDER — LACTATED RINGERS IV SOLN
INTRAVENOUS | Status: DC | PRN
  Administered 2016-12-15: 10:00:00 via INTRAVENOUS

## 2016-12-15 MED ORDER — ALBUTEROL SULFATE HFA 108 (90 BASE) MCG/ACT IN AERS
INHALATION_SPRAY | RESPIRATORY_TRACT | Status: AC
  Filled 2016-12-15: qty 6.7

## 2016-12-15 MED ORDER — FENTANYL CITRATE (PF) 250 MCG/5ML IJ SOLN
INTRAMUSCULAR | Status: AC
  Filled 2016-12-15: qty 5

## 2016-12-15 SURGICAL SUPPLY — 30 items
BALLN PULM 15 16.5 18 X 75CM (BALLOONS)
BALLN PULM 15 16.5 18X75 (BALLOONS)
BALLOON PULM 15 16.5 18X75 (BALLOONS) IMPLANT
BLADE SURG 10 STRL SS (BLADE) ×4 IMPLANT
BLADE SURG 15 STRL LF DISP TIS (BLADE) IMPLANT
BLADE SURG 15 STRL SS (BLADE)
CANISTER SUCT 3000ML PPV (MISCELLANEOUS) ×4 IMPLANT
CONT SPEC 4OZ CLIKSEAL STRL BL (MISCELLANEOUS) IMPLANT
COVER BACK TABLE 60X90IN (DRAPES) ×4 IMPLANT
DRAPE HALF SHEET 40X57 (DRAPES) ×4 IMPLANT
GAUZE SPONGE 4X4 12PLY STRL (GAUZE/BANDAGES/DRESSINGS) ×4 IMPLANT
GLOVE ECLIPSE 7.5 STRL STRAW (GLOVE) ×4 IMPLANT
GUARD TEETH (MISCELLANEOUS) IMPLANT
KIT BASIN OR (CUSTOM PROCEDURE TRAY) ×4 IMPLANT
KIT ROOM TURNOVER OR (KITS) ×4 IMPLANT
MARKER SKIN DUAL TIP RULER LAB (MISCELLANEOUS) IMPLANT
NDL HYPO 25GX1X1/2 BEV (NEEDLE) IMPLANT
NEEDLE HYPO 25GX1X1/2 BEV (NEEDLE) ×4 IMPLANT
NS IRRIG 1000ML POUR BTL (IV SOLUTION) ×4 IMPLANT
PAD ARMBOARD 7.5X6 YLW CONV (MISCELLANEOUS) ×8 IMPLANT
PATTIES SURGICAL .5 X3 (DISPOSABLE) IMPLANT
SOLUTION ANTI FOG 6CC (MISCELLANEOUS) IMPLANT
SPECIMEN JAR SMALL (MISCELLANEOUS) ×4 IMPLANT
SYR CONTROL 10ML LL (SYRINGE) IMPLANT
SYR TB 1ML LUER SLIP (SYRINGE) IMPLANT
TOWEL OR 17X24 6PK STRL BLUE (TOWEL DISPOSABLE) ×4 IMPLANT
TUBE CONNECTING 12'X1/4 (SUCTIONS) ×1
TUBE CONNECTING 12X1/4 (SUCTIONS) ×3 IMPLANT
TUBE TRACH SHILEY  6 DIST  CUF (TUBING) ×3 IMPLANT
WATER STERILE IRR 1000ML POUR (IV SOLUTION) ×4 IMPLANT

## 2016-12-15 NOTE — Anesthesia Postprocedure Evaluation (Signed)
Anesthesia Post Note  Patient: Judy Spears  Procedure(s) Performed: TRACHEOSTOMY CHANGE (N/A Neck)     Patient location during evaluation: SICU Anesthesia Type: MAC Level of consciousness: awake and alert Pain management: pain level controlled Vital Signs Assessment: post-procedure vital signs reviewed and stable Respiratory status: spontaneous breathing, nonlabored ventilation, respiratory function stable and patient remains intubated per anesthesia plan Cardiovascular status: blood pressure returned to baseline and stable Postop Assessment: no apparent nausea or vomiting Anesthetic complications: no Comments: No antiemetics given due to MAC procedure, and no patient complaint of nausea/vomiting.     Last Vitals:  Vitals:   12/15/16 1300 12/15/16 1400  BP: (!) 153/99 (!) 115/56  Pulse: 81 76  Resp: (!) 33 20  Temp:    SpO2: 100% 100%    Last Pain:  Vitals:   12/15/16 1124  TempSrc: Oral  PainSc:                  Catalina Gravel

## 2016-12-15 NOTE — Transfer of Care (Signed)
Immediate Anesthesia Transfer of Care Note  Patient: Judy Spears  Procedure(s) Performed: TRACHEAL DILITATION (N/A )  Patient Location: ICU  Anesthesia Type:MAC  Level of Consciousness: drowsy  Airway & Oxygen Therapy: Patient placed on Ventilator (see vital sign flow sheet for setting)  Post-op Assessment: Report given to RN and Post -op Vital signs reviewed and stable  Post vital signs: Reviewed and stable  Last Vitals:  Vitals:   12/15/16 0748 12/15/16 0806  BP: (!) 174/76   Pulse: 85   Resp: (!) 27   Temp:  37 C  SpO2:      Last Pain:  Vitals:   12/15/16 0806  TempSrc: Oral  PainSc:          Complications: No apparent anesthesia complications

## 2016-12-15 NOTE — Op Note (Signed)
Preop/postop diagnosis: Respiratory failure Procedure: Tracheotomy change Anesthesia: Mac Estimated blood loss: 0 Indications: 66 year old with a trach dependence and on ventilator with a #6 cuffed. The pulmonary specialists or having difficulty with weaning and ventilating because of air leak and size of trach and need a #6 cuffed placed. The family was informed a risk and benefits of the procedure and options were discussed all questions are answered and consent was obtained. Operation: Patient is taken the operating room placed in the supine position after dip her van anesthesia to just sedate the patient was draped in usual sterile manner. The #4 Shiley was obviously leaking through the mouth with ventilation. The balloon was deflated and the number for trach was removed without difficulty. The stoma was clean. There was no granulation tissue. A #6 Shiley was placed without any difficulty. The balloon was inflated. The patient was ventilated easily. The wound looked clean and there was no bleeding. The Velcro trach collar was reattached and the patient was awakened brought to recovery in stable condition counts correct

## 2016-12-15 NOTE — Anesthesia Preprocedure Evaluation (Addendum)
Anesthesia Evaluation  Patient identified by MRN, date of birth, ID band Patient awake    Reviewed: Allergy & Precautions, NPO status , Patient's Chart, lab work & pertinent test results  Airway Mallampati: Trach  TM Distance: >3 FB Neck ROM: Full    Dental  (+) Teeth Intact, Dental Advisory Given   Pulmonary pneumonia,  Tracheostomy acute on chronic respiratory failure from HCAP    + decreased breath sounds      Cardiovascular hypertension, Pt. 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  Reproductive/Obstetrics                             Anesthesia Physical Anesthesia Plan  ASA: II  Anesthesia Plan: MAC   Post-op Pain Management:    Induction: Intravenous  PONV Risk Score and Plan: 3 and Propofol infusion and Treatment may vary due to age or medical condition  Airway Management Planned: Tracheostomy  Additional Equipment:   Intra-op Plan:   Post-operative Plan: Post-operative intubation/ventilation  Informed Consent: I have reviewed the patients History and Physical, chart, labs and discussed the procedure including the risks, benefits and alternatives for the proposed anesthesia with the patient or authorized representative who has indicated his/her understanding and acceptance.   Dental advisory given  Plan Discussed with: CRNA  Anesthesia Plan Comments: (Trach change in OR with ENT)       Anesthesia Quick Evaluation

## 2016-12-15 NOTE — Progress Notes (Signed)
PULMONARY / CRITICAL CARE MEDICINE   Name: Judy Spears MRN: [id number] DOB: May 24, 1950    ADMISSION DATE:  12/05/2016 CONSULTATION DATE:  12/05/16  REFERRING MD:  EDP  CHIEF COMPLAINT:  Increased secretions and WOB  HISTORY OF PRESENT ILLNESS:   66 yo female with acute on chronic respiratory failure from HCAP.  PMHx of breast cancer in Summer 2018, Rt hip fx, Powassan encephalitis  SUBJECTIVE:  Patient smiling, attempting to verbalize.   REVIEW OF SYSTEMS  Denies pain.   VITAL SIGNS: BP (!) 160/81 (BP Location: Right Leg)   Pulse 82   Temp 98.8 F (37.1 C) (Oral)   Resp (!) 25   Ht 5\' 2"  (1.575 m)   Wt 94 lb 12.8 oz (43 kg)   LMP 12/01/2010   SpO2 100%   BMI 17.34 kg/m   VENTILATOR SETTINGS: Vent Mode: PRVC FiO2 (%):  [30 %] 30 % Set Rate:  [20 bmp] 20 bmp Vt Set:  [420 mL] 420 mL PEEP:  [5 cmH20] 5 cmH20 Pressure Support:  [8 cmH20] 8 cmH20 Plateau Pressure:  [16 cmH20-20 cmH20] 16 cmH20  INTAKE / OUTPUT: I/O last 3 completed shifts: In: 7654 [I.V.:825; Other:550; YT/KP:5465; IV KCLEXNTZG:017] Out: 3130 [Urine:3130]  PHYSICAL EXAMINATION:  General - diaphoretic ENT - trach site clean  Cardiac - regular, no murmur Chest - b/l rhonchi Abd - soft, erythema around G tube site (stable from 11/16) Ext - no edema Skin - erythema around G tube site Neuro - moves extremities  LABS:  BMET Recent Labs  Lab 12/14/16 0304 12/14/16 1816 12/15/16 0426  NA 141 137 137  K 3.7 3.7 3.6  CL 102 98* 98*  CO2 32 31 30  BUN 17 13 13   CREATININE 0.31* <0.30* <0.30*  GLUCOSE 131* 80 95    Electrolytes Recent Labs  Lab 12/12/16 0220  12/14/16 0304 12/14/16 1816 12/15/16 0426  CALCIUM 8.9   < > 8.4* 8.4* 8.5*  MG 2.5*  --  1.9  --  2.1  PHOS  --   --  3.2  --  3.7   < > = values in this interval not displayed.    CBC Recent Labs  Lab 12/13/16 0303 12/14/16 0304 12/15/16 0426  WBC 9.9 8.7 8.5  HGB 11.8* 11.6* 11.5*  HCT 40.1 37.8  36.7  PLT 259 231 252    Coag's No results for input(s): APTT, INR in the last 168 hours.  Sepsis Markers Recent Labs  Lab 12/12/16 1603 12/12/16 1605 12/13/16 0303 12/14/16 0304  LATICACIDVEN  --  1.4  --   --   PROCALCITON 0.18  --  0.15 0.14    ABG No results for input(s): PHART, PCO2ART, PO2ART in the last 168 hours.  Liver Enzymes Recent Labs  Lab 12/13/16 0303 12/14/16 0304 12/15/16 0426  AST 132* 72*  --   ALT 316* 212*  --   ALKPHOS 98 100  --   BILITOT 0.8 0.7  --   ALBUMIN 2.5* 2.5* 2.7*    Cardiac Enzymes No results for input(s): TROPONINI, PROBNP in the last 168 hours.  Glucose Recent Labs  Lab 12/13/16 0418 12/13/16 0741 12/13/16 1133 12/13/16 1538 12/13/16 1941 12/14/16 1508  GLUCAP 129* 107* 102* 97 86 115*    Imaging No results found.   CULTURES: Tracheal Aspirate Culture 11/11 >>> MRSA  Blood 11/18 >> NGTD Urine 11/18 >> no growth Trach aspirate 11/19 >> few staph aureus   ANTIBIOTICS: Vancomycin 11/11 >  11/17 Meropenem 11/11 > 11/13 Diflucan 11/18 >> Vancomycin 11/18 >>  Tressie Ellis 11/18 >>   SIGNIFICANT EVENTS: 11/11 admit 11/12 worsening WOB and desat > trach exchanged and placed on vent support 11/16 transfer to vent SDU bed 11/17 Fever, increased respiratory distress, back to ICU 11/18 restarted vanc, fortaz, diflucan  LINES/TUBES: Trach Shiley 57mm uncuffed (replaced 11/12) G tube   DISCUSSION: 66 yo female with acute on chronic respiratory failure from HCAP.  Had initial improvement and transferred to vent SDU.  Developed progressive fever, increase WOB, and rash around G tube site.  Transferred back to ICU.  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 Worsening respiratory distress 11/18. Chronic tracheostomy status. CXR with hypoventilation and increased atelectasis.  - full vent support - plan for trach change 11/21 - CXR with possible pneumo, CT without pneumothorax -  continue vanc and fortaz until tracheal aspirate culture finalized  Severe protein calorie malnutrition. Dysphagia. 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 - wheelchair bound >> mobility complicated by recent hip fx - PT/OT - amantadine, baclofen, neurontin, melatonin, primidone, valtrex  Hx of HTN, HLD. - lipitor, norvasc  Hypokalemia. Hypernatremia. - f/u BMET - add D5W, continue free water  DVT prophylaxis - SQ heparin SUP - protonix Nutrition - tube feeds Goals of care - full code  Ralene Ok, MD PGY2 FM Resident 7:06 AM

## 2016-12-15 NOTE — Progress Notes (Signed)
RT transported patient to CT and returned to 0Y77 without complications. Vital signs stable throughout. Patient tolerated well. RN and family at bedside. RT will continue to monitor.

## 2016-12-16 ENCOUNTER — Inpatient Hospital Stay (HOSPITAL_COMMUNITY): Payer: Medicare Other

## 2016-12-16 LAB — RENAL FUNCTION PANEL
ALBUMIN: 2.6 g/dL — AB (ref 3.5–5.0)
ANION GAP: 7 (ref 5–15)
BUN: 15 mg/dL (ref 6–20)
CALCIUM: 8.5 mg/dL — AB (ref 8.9–10.3)
CO2: 33 mmol/L — ABNORMAL HIGH (ref 22–32)
Chloride: 100 mmol/L — ABNORMAL LOW (ref 101–111)
Glucose, Bld: 113 mg/dL — ABNORMAL HIGH (ref 65–99)
PHOSPHORUS: 3 mg/dL (ref 2.5–4.6)
POTASSIUM: 3.3 mmol/L — AB (ref 3.5–5.1)
Sodium: 140 mmol/L (ref 135–145)

## 2016-12-16 LAB — CBC WITH DIFFERENTIAL/PLATELET
BASOS ABS: 0 10*3/uL (ref 0.0–0.1)
BASOS PCT: 0 %
Eosinophils Absolute: 0.2 10*3/uL (ref 0.0–0.7)
Eosinophils Relative: 2 %
HEMATOCRIT: 35.9 % — AB (ref 36.0–46.0)
HEMOGLOBIN: 11.6 g/dL — AB (ref 12.0–15.0)
LYMPHS PCT: 17 %
Lymphs Abs: 1.2 10*3/uL (ref 0.7–4.0)
MCH: 30 pg (ref 26.0–34.0)
MCHC: 32.3 g/dL (ref 30.0–36.0)
MCV: 92.8 fL (ref 78.0–100.0)
Monocytes Absolute: 0.4 10*3/uL (ref 0.1–1.0)
Monocytes Relative: 6 %
NEUTROS ABS: 5.4 10*3/uL (ref 1.7–7.7)
NEUTROS PCT: 75 %
Platelets: 228 10*3/uL (ref 150–400)
RBC: 3.87 MIL/uL (ref 3.87–5.11)
RDW: 16.9 % — ABNORMAL HIGH (ref 11.5–15.5)
WBC: 7.1 10*3/uL (ref 4.0–10.5)

## 2016-12-16 LAB — MAGNESIUM: MAGNESIUM: 2.1 mg/dL (ref 1.7–2.4)

## 2016-12-16 MED ORDER — POTASSIUM CHLORIDE 20 MEQ/15ML (10%) PO SOLN
20.0000 meq | ORAL | Status: AC
  Administered 2016-12-16 (×2): 20 meq
  Filled 2016-12-16 (×2): qty 15

## 2016-12-16 NOTE — Progress Notes (Signed)
[facility] Attending Note  Physician Requesting Consult:Peter Messick, M.D. 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 Known chronic hypoxic respiratory failure. Recently diagnosed with breast cancer having undergone lumpectomy summer 2018. Lives at home with family and does not at times as well as make small attempts to verbalize. Recently she has been found to have a broken right hip and is wheelchair-bound at baseline. Presents with 1 day history of increasing purulent sputum production and increased work of breathing. Her mental status has been slightly worse than usual. No subjective fever, chills, or sweats. No recent sick contacts that the husband reports. Patient has been more restless at home recently. Her saturations have dropped into the 70s at times today prompting her husband to bring her to the emergency room. Through imaging she was subsequently found to have bibasilar infiltrates suspicious for healthcare associated pneumonia and was started on broad-spectrum antiviral therapy.  Subjective:  Patient on tracheostomy upsized yesterday. No acute events. More awake & alert today. Patient denying any nausea, pain, or difficulty breathing.  Review of Systems:  Unable to obtain given ventilator requirement.   Vent Mode: PRVC FiO2 (%):  [30 %-40 %] 40 % Set Rate:  [20 bmp] 20 bmp Vt Set:  [420 mL] 420 mL PEEP:  [5 cmH20] 5 cmH20 Plateau Pressure:  [15 KGY18-56 cmH20] 18 cmH20  Temp:  [98.4 F (36.9 C)-100 F (37.8 C)] 98.9 F (37.2 C) (11/22 0808) Pulse Rate:  [75-85] 81 (11/22 0700) Resp:  [18-34] 22 (11/22 0753) BP: (115-172)/(56-99) 146/69 (11/22 0938) SpO2:  [99 %-100 %] 100 % (11/22 0700) FiO2 (%):  [30 %-40 %] 40 % (11/22 0755) Weight:  [95 lb 3.8 oz (43.2 kg)] 95 lb 3.8 oz (43.2 kg) (11/22 0500)   General: Husband at bedside. No distress.  Comfortable. Integument: Minimal patchy erythematous rash around PEG tube site improving slightly. Neurological: Attends to voice. Nods to questions. Mouth answers to questions. Pulmonary: Clear breath sounds bilaterally. Minimal tracheal secretions. Mildly increased work of breathing on ventilator support. Abdomen: Soft. Nondistended. Normal bowel sounds. Cardiovascular: Regular rhythm. No JVD appreciated. HEENT: Moist with membranes. Tracheostomy in place. No scleral icterus.  LINES/TUBES: Trach #6 (ENT) >>>  PEG >>> Foley >>> PIV  CBC Latest Ref Rng & Units 12/16/2016 12/15/2016 12/14/2016  WBC 4.0 - 10.5 K/uL 7.1 8.5 8.7  Hemoglobin 12.0 - 15.0 g/dL 11.6(L) 11.5(L) 11.6(L)  Hematocrit 36.0 - 46.0 % 35.9(L) 36.7 37.8  Platelets 150 - 400 K/uL 228 252 231    BMP Latest Ref Rng & Units 12/16/2016 12/15/2016 12/15/2016  Glucose 65 - 99 mg/dL 113(H) 140(H) 95  BUN 6 - 20 mg/dL '15 10 13  '$ Creatinine 0.44 - 1.00 mg/dL <0.30(L) 0.31(L) <0.30(L)  BUN/Creat Ratio 12 - 28 - - -  Sodium 135 - 145 mmol/L 140 139 137  Potassium 3.5 - 5.1 mmol/L 3.3(L) 3.0(L) 3.6  Chloride 101 - 111 mmol/L 100(L) 101 98(L)  CO2 22 - 32 mmol/L 33(H) 31 30  Calcium 8.9 - 10.3 mg/dL 8.5(L) 8.2(L) 8.5(L)    Hepatic Function Latest Ref Rng & Units 12/16/2016 12/15/2016 12/14/2016  Total Protein 6.5 - 8.1 g/dL - - 5.2(L)  Albumin 3.5 - 5.0 g/dL 2.6(L) 2.7(L) 2.5(L)  AST 15 - 41 U/L - - 72(H)  ALT 14 - 54 U/L - - 212(H)  Alk Phosphatase 38 - 126 U/L - - 100  Total  Bilirubin 0.3 - 1.2 mg/dL - - 0.7  Bilirubin, Direct 0.0 - 0.3 mg/dL - - -   IMAGING/STUDIES: PORT CXR 11/11: Previously reviewed by me. And reticular opacities predominantly in the lung bases with some areas of alveolar filling suggestive of a pneumonitis. PORT CXR 11/19: Previously reviewed by me. Questionable new right lower lung zone opacity. No pleural effusion appreciated. RUQ U/S 11/19:Unremarkable right upper quadrant ultrasound. CT  CHEST W/O 11/21:  Previously reviewed by me. Focal dependent opacity posterior segment right lower lobe. Mucous plugging within left lower lobe noted. No pleural effusion. No pneumothorax. PORT CXR 11/22:  Personally reviewed by me. Questionable progression of left basilar opacity. Low lung volumes today. No new focal opacity appreciated. Still retains good visualization of left hemidiaphragm. Tracheostomy in place.  MICROBIOLOGY: MRSA PCR 11/11:  Positive  Blood Cultures x2 11/11:  Negative  Tracheal Aspirate Culture 11/11:  MRSA Urine Streptococcal Antigen 11/11:  Negative Urine Legionella Antigen 11/11:  Negative  Urine Culture 11/18:  Negative  Blood Cultures x2 11/18 >>> Tracheal Aspirate Culture 11/19:  MRSA   ANTIBIOTICS: Merrem 11/11 - 11/13 Diflucan 11/18- 11/20 Nystatin Powder 11/20 >>> Vancomycin 11/11 - 11/17; 11/18 >>> Tressie Ellis 11/18 >>>  SIGNIFICANT EVENTS: 11/11 - Admit 11/12 - Worsening work of breathing & desat >> trach changed to cuffed #4 & placed on full vent support 11/16 - Transfer to vent SDU bed 11/17 - Transferred back to ICU w/ fever 11/21 - Trach up-sized by ENT to #6 cuffed   ASSESSMENT/PLAN:  66 y.o. female with chronic tracheostomy and acute on chronic hypoxic respiratory failure. Secretions on improving. Suspect patient has an orgasm that we have not yet cultured. As such, I am continuing her current antibiotic regimen. Suspect MRSA could be a colonizer.   1. Acute on chronic hypoxic respiratory failure: Continuing to wean ventilator support. Plan to attempt tracheostomy collar today.  2. Sepsis: Question new healthcare associated pneumonia. Continuing empiric vancomycin and Fortaz. Awaiting finalization of blood cultures.  3. Healthcare associated pneumonia: Question whether or not MRSA could be a colonizer. Continuing treatment empirically with vancomycin and Fortaz. Plan for a 7 day course of both antibiotics.  4. Hypokalemia: Status post potassium  chloride via the tube. Monitor electrolytes daily. 5. Hypernatremia: Resolved. Continuing free water via tube. Monitoring electrolytes daily.  6. Transaminitis: Previously improving. Continuing to monitor hepatic function panel intimately.  7. Severe protein-calorie malnutrition: Continuing tube feedings per dietary recommendations. 8. History of encephalitis: Currently wheelchair-bound. PT/OT consulted. Continuing baclofen, Neurontin, melatonin, primidone, Valtrex, and amantadine. 9. Essential hypertension: Continuing Norvasc. Monitoring vitals per unit protocol. 10. Hyperlipidemia: Continuing on Lipitor. 11. Anemia: Mild. No signs of active bleeding. Trending cell counts daily with CBC.  Prophylaxis:  Heparin [employer] q8hr & Protonix via tube. .  Diet:  NPO. Continuing Tube Feedings. Code Status:  Full Code. Disposition:  Remains critically ill in the ICU while weaning ventilator Family Update:  Husband updated during rounds today.   I have personally spent a total of 34 minutes of critical care time today caring for the patient, updating husband at bedside, & reviewing the patient's electronic medical record.  Sonia Baller Ashok Cordia, M.D. [facility] Pager:  [phone] After 7pm or if no response, call [phone] 10:06 AM 12/16/16

## 2016-12-16 NOTE — Progress Notes (Signed)
[facility] ADULT ICU REPLACEMENT PROTOCOL FOR AM LAB REPLACEMENT ONLY  The patient does apply for the [facility] Adult ICU Electrolyte Replacment Protocol based on the criteria listed below:   1. Is GFR >/= 40 ml/min? Yes.    Patient's GFR today is >60 2. Is urine output >/= 0.5 ml/kg/hr for the last 6 hours? Yes.   Patient's UOP is 3.1 ml/kg/hr 3. Is BUN < 60 mg/dL? Yes.    Patient's BUN today is 15 4. Abnormal electrolyte  K 3.3 5. Ordered repletion with: per protocol 6. If a panic level lab has been reported, has the CCM MD in charge been notified? Yes.  .   Physician:  Antoine Primas 12/16/2016 6:14 AM

## 2016-12-17 ENCOUNTER — Encounter (HOSPITAL_COMMUNITY): Payer: Self-pay | Admitting: Otolaryngology

## 2016-12-17 LAB — RENAL FUNCTION PANEL
ALBUMIN: 2.9 g/dL — AB (ref 3.5–5.0)
Anion gap: 8 (ref 5–15)
BUN: 18 mg/dL (ref 6–20)
CHLORIDE: 104 mmol/L (ref 101–111)
CO2: 32 mmol/L (ref 22–32)
Calcium: 9.1 mg/dL (ref 8.9–10.3)
Glucose, Bld: 120 mg/dL — ABNORMAL HIGH (ref 65–99)
PHOSPHORUS: 4 mg/dL (ref 2.5–4.6)
POTASSIUM: 3.9 mmol/L (ref 3.5–5.1)
Sodium: 144 mmol/L (ref 135–145)

## 2016-12-17 LAB — CULTURE, BLOOD (ROUTINE X 2)
Culture: NO GROWTH
Culture: NO GROWTH
SPECIAL REQUESTS: ADEQUATE
Special Requests: ADEQUATE

## 2016-12-17 LAB — GLUCOSE, CAPILLARY
Glucose-Capillary: 113 mg/dL — ABNORMAL HIGH (ref 65–99)
Glucose-Capillary: 104 mg/dL — ABNORMAL HIGH (ref 65–99)

## 2016-12-17 LAB — CBC WITH DIFFERENTIAL/PLATELET
BASOS ABS: 0 10*3/uL (ref 0.0–0.1)
Basophils Relative: 0 %
EOS ABS: 0.3 10*3/uL (ref 0.0–0.7)
EOS PCT: 3 %
HCT: 37.1 % (ref 36.0–46.0)
HEMOGLOBIN: 11.7 g/dL — AB (ref 12.0–15.0)
Lymphocytes Relative: 18 %
Lymphs Abs: 1.5 10*3/uL (ref 0.7–4.0)
MCH: 29.3 pg (ref 26.0–34.0)
MCHC: 31.5 g/dL (ref 30.0–36.0)
MCV: 93 fL (ref 78.0–100.0)
Monocytes Absolute: 0.7 10*3/uL (ref 0.1–1.0)
Monocytes Relative: 9 %
NEUTROS PCT: 70 %
Neutro Abs: 5.9 10*3/uL (ref 1.7–7.7)
PLATELETS: 262 10*3/uL (ref 150–400)
RBC: 3.99 MIL/uL (ref 3.87–5.11)
RDW: 17.1 % — ABNORMAL HIGH (ref 11.5–15.5)
WBC: 8.4 10*3/uL (ref 4.0–10.5)

## 2016-12-17 LAB — MAGNESIUM: Magnesium: 2.3 mg/dL (ref 1.7–2.4)

## 2016-12-17 NOTE — Care Management Important Message (Signed)
Important Message  Patient Details  Name: Judy Spears MRN: [id number] Date of Birth: 1950-09-21   Medicare Important Message Given:  Yes    Kenry Daubert, Rory Percy, RN 12/17/2016, 12:13 PM

## 2016-12-17 NOTE — Progress Notes (Addendum)
Pharmacy Antibiotic Note  Judy Spears is a 66 y.o. female admitted on 12/05/2016 with s/p treatment for MRSA pneumonia and repeat respiratory culture with few staph aureus.  Pharmacy has been consulted for Vancomycin and Ceftazidime dosing. Vancomycin trough therapeutic on last check. SCr is low in setting of low muscle mass but patient is making good urine. Patient improving clinically. WBC WNL and patient afebrile. Will put in a stop date for 7 days of ceftaz and 7 days since Vancomycin re-start.   Plan: Continue Vancomycin 750mg  IV every 8 hours Continue Ceftazidime to 1g IV every 8 hours Monitor renal function, culture result, and clinical status Stop dates entered for a 7 day course  Height: 5\' 2"  (157.5 cm) Weight: 95 lb 14.4 oz (43.5 kg) IBW/kg (Calculated) : 50.1  Temp (24hrs), Avg:98.8 F (37.1 C), Min:98.6 F (37 C), Max:99.1 F (37.3 C)  Recent Labs  Lab 12/12/16 1605 12/13/16 0303 12/13/16 1658 12/14/16 0304 12/14/16 1816 12/15/16 0426 12/15/16 1603 12/16/16 0204 12/17/16 0302  WBC  --  9.9  --  8.7  --  8.5  --  7.1 8.4  CREATININE  --  <0.30* 0.30* 0.31* <0.30* <0.30* 0.31* <0.30* <0.30*  LATICACIDVEN 1.4  --   --   --   --   --   --   --   --   VANCOTROUGH  --   --  15  --   --   --   --   --   --     CrCl cannot be calculated (This lab value cannot be used to calculate CrCl because it is not a number: <0.30).    Allergies  Allergen Reactions  . Cefepime Other (See Comments)    Status epilepticus- this reaction is questionable per husband.States that it was determined that pt did not have seizure activity. Status epilepticus  . Tetracyclines & Related Photosensitivity and Other (See Comments)    Blisters (childhood allergy)  . Doxycycline Monohydrate Other (See Comments)    Blisters, photosensitivity- family state that the reaction is actually to tetracycline.  . Pollen Extract Other (See Comments)    Running nose (seasonal allergies)     Antimicrobials this admission: Aztreonam x1 Vanc 11/11>>11/17; restart 11/18 >>(11/24) Merrem 11/11>>11/13 Ceftazidime 11/18 >>(11/24) Fluconazole 11/18 >>11/19  Dose adjustments this admission: 11/15 VT = 8 mcg/mL on 500mg  q8 >> 750mg  q8 11/19 VT = 15 mcg/mL on 750mg  q8h  Microbiology results: 11/11BCx -negative 11/11TA - MRSA 11/11 Strep pneumo - negative 11/11 MRSA PCR - positive 11/18 BCx >>NGTD 11/18 UCx >>No growth 11/19 Resp >> few staph aureus  Thank you for allowing pharmacy to be a part of this patient's care.  Susa Raring, PharmD, BCPS PGY2 Infectious Diseases Pharmacy Resident  Clinical phone 12/17/2016 until 3:[phone] After hours, please call #[phone] 12/17/2016 9:27 AM

## 2016-12-17 NOTE — Progress Notes (Signed)
[facility] Attending Note  Physician Requesting Consult:Peter Messick, M.D. 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 Known chronic hypoxic respiratory failure. Recently diagnosed with breast cancer having undergone lumpectomy summer 2018. Lives at home with family and does not at times as well as make small attempts to verbalize. Recently she has been found to have a broken right hip and is wheelchair-bound at baseline. Presents with 1 day history of increasing purulent sputum production and increased work of breathing. Her mental status has been slightly worse than usual. No subjective fever, chills, or sweats. No recent sick contacts that the husband reports. Patient has been more restless at home recently. Her saturations have dropped into the 70s at times today prompting her husband to bring her to the emergency room. Through imaging she was subsequently found to have bibasilar infiltrates suspicious for healthcare associated pneumonia and was started on broad-spectrum antiviral therapy.  Subjective:  No acute events overnight. Patient tolerated limited pressure support yesterday.   Review of Systems:  Unable to obtain given ventilator requirement.   Vent Mode: PRVC FiO2 (%):  [40 %] 40 % Set Rate:  [20 bmp] 20 bmp Vt Set:  [420 mL] 420 mL PEEP:  [5 cmH20] 5 cmH20 Plateau Pressure:  [16 cmH20-26 cmH20] 22 cmH20  Temp:  [98.6 F (37 C)-99.1 F (37.3 C)] 98.6 F (37 C) (11/23 0745) Pulse Rate:  [77-86] 82 (11/23 0800) Resp:  [16-40] 28 (11/23 0800) BP: (108-169)/(27-100) 137/100 (11/23 0800) SpO2:  [100 %] 100 % (11/23 0800) FiO2 (%):  [40 %] 40 % (11/23 0800) Weight:  [95 lb 14.4 oz (43.5 kg)] 95 lb 14.4 oz (43.5 kg) (11/23 0600)   General: Husband at bedside. No acute distress. Patient currently getting oral care from family. Integument: Warm. Dry. No  rash on exposed skin. Neurological: Nods to questions. Mouths answers. Attends to voice. Abdomen: Soft. Nondistended. Normal bowel sounds. Pulmonary: Clear bilateral auscultation. Symmetric chest wall rise on ventilator. Cardiovascular: Regular rhythm. No JVD appreciated. HEENT: Moist membranes. Tracheostomy in place.  LINES/TUBES: Trach #6 (ENT) >>>  PEG >>> Foley >>> PIV  CBC Latest Ref Rng & Units 12/17/2016 12/16/2016 12/15/2016  WBC 4.0 - 10.5 K/uL 8.4 7.1 8.5  Hemoglobin 12.0 - 15.0 g/dL 11.7(L) 11.6(L) 11.5(L)  Hematocrit 36.0 - 46.0 % 37.1 35.9(L) 36.7  Platelets 150 - 400 K/uL 262 228 252    BMP Latest Ref Rng & Units 12/17/2016 12/16/2016 12/15/2016  Glucose 65 - 99 mg/dL 120(H) 113(H) 140(H)  BUN 6 - 20 mg/dL _0 Creatinine 0.44 - 1.00 mg/dL <0.30(L) <0.30(L) 0.31(L)  BUN/Creat Ratio 12 - 28 - - -  Sodium 135 - 145 mmol/L 144 140 139  Potassium 3.5 - 5.1 mmol/L 3.9 3.3(L) 3.0(L)  Chloride 101 - 111 mmol/L 104 100(L) 101  CO2 22 - 32 mmol/L 32 33(H) 31  Calcium 8.9 - 10.3 mg/dL 9.1 8.5(L) 8.2(L)    Hepatic Function Latest Ref Rng & Units 12/17/2016 12/16/2016 12/15/2016  Total Protein 6.5 - 8.1 g/dL - - -  Albumin 3.5 - 5.0 g/dL 2.9(L) 2.6(L) 2.7(L)  AST 15 - 41 U/L - - -  ALT 14 - 54 U/L - - -  Alk Phosphatase 38 - 126 U/L - - -  Total Bilirubin 0.3 - 1.2 mg/dL - - -  Bilirubin, Direct 0.0 - 0.3 mg/dL - - -   IMAGING/STUDIES:  PORT CXR 11/11: Previously reviewed by me. And reticular opacities predominantly in the lung bases with some areas of alveolar filling suggestive of a pneumonitis. PORT CXR 11/19: Previously reviewed by me. Questionable new right lower lung zone opacity. No pleural effusion appreciated. RUQ U/S 11/19:Unremarkable right upper quadrant ultrasound. CT CHEST W/O 11/21:  Previously reviewed by me. Focal dependent opacity posterior segment right lower lobe. Mucous plugging within left lower lobe noted. No pleural effusion. No  pneumothorax. PORT CXR 11/22:  Previously reviewed by me. Questionable progression of left basilar opacity. Low lung volumes today. No new focal opacity appreciated. Still retains good visualization of left hemidiaphragm. Tracheostomy in place.  MICROBIOLOGY: MRSA PCR 11/11:  Positive  Blood Cultures x2 11/11:  Negative  Tracheal Aspirate Culture 11/11:  MRSA Urine Streptococcal Antigen 11/11:  Negative Urine Legionella Antigen 11/11:  Negative  Urine Culture 11/18:  Negative  Blood Cultures x2 11/18 >>> Tracheal Aspirate Culture 11/19:  MRSA   ANTIBIOTICS: Merrem 11/11 - 11/13 Diflucan 11/18- 11/20 Nystatin Powder 11/20 >>> Vancomycin 11/11 - 11/17; 11/18 >>> (stop date 11/24) Tressie Ellis 11/18 >>> (stop date 11/24)  SIGNIFICANT EVENTS: 11/11 - Admit 11/12 - Worsening work of breathing & desat >> trach changed to cuffed #4 & placed on full vent support 11/16 - Transfer to vent SDU bed 11/17 - Transferred back to ICU w/ fever 11/21 - Trach up-sized by ENT to #6 cuffed  11/22 - Tolerated limited PS weaning  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 Admitted with acute on chronic hypoxic respiratory failure secondary to pneumonia. Clinically improving. Tolerating limited pressure support weaning.  1. Acute on chronic hypoxic respiratory failure: Continuing pressure support wean. Continuing full ventilator support arrest. 2. Sepsis: Secondary to pneumonia. Continuing vancomycin & Tressie Ellis with stop date set for tomorrow. Plan to repeat culture for fever. Awaiting finalization of blood cultures.  3. Pneumonia: Continuing antibiotics as above. Question whether or not MRSA could be a colonizer. Plan to repeat culture for any fever. 4. Hypokalemia: Resolved.  5. Hypernatremia: Resolved. Continuing free water via tube. Monitoring electrolytes daily.  6. Transaminitis: Previously improving. Continuing to monitor hepatic function panel intimately.  7. Severe protein-calorie  malnutrition: Continuing tube feedings per dietary recommendations. 8. History of encephalitis: Currently wheelchair-bound. PT/OT consulted. Continuing baclofen, Neurontin, melatonin, primidone, Valtrex, and amantadine. 9. Essential hypertension: Continuing Norvasc. Monitoring vitals per unit protocol. 10. Hyperlipidemia: Continuing on Lipitor. 11. Anemia: Hemoglobin stable. No signs of active bleeding. Trending cell counts daily with CBC.  Prophylaxis:  Heparin [place] q8hr & Protonix via tube. .  Diet:  NPO. Continuing Tube Feedings. Code Status:  Full Code. Disposition:  Transferring patient to ventilator stepdown bed. PCCM to remain primary. Family Update:  Husband and family updated at bedside this morning.  I have spent a total of 37 minutes of time today caring for the patient, reviewing the patient's electronic medical record, and with more than 50% of that time spent coordinating transfer of care with the patient as well as reviewing the continuing plan of care with the patient & family at bedside.  Sonia Baller Ashok Cordia, M.D. [facility] Pulmonary [facility] Critical Care Pager:  [phone] After 7pm or if no response, call [phone] 9:02 AM 12/17/16

## 2016-12-17 NOTE — Progress Notes (Signed)
Nutrition Follow-up  DOCUMENTATION CODES:   Underweight  INTERVENTION:    Continue Osmolite 1.5 at 90 ml/h (2160 ml)  Continue Pro-stat 30 ml BID  Provides 3440 kcal, 165 gm protein, 1646 ml free water daily  Also, recommend increasing free water flushes to 250 ml QID as volume status allows  NUTRITION DIAGNOSIS:   Increased nutrient needs related to chronic illness, wound healing as evidenced by estimated needs.  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  Daily weights have shown a slight increase every day. Patient has been tolerating TF well.  Volume based feeding protocol has been used to ensure 100% of ordered tube feeding volume is received each day.  Labs reviewed.  Medications reviewed and include Calcium, Vitamin D, Colace, Florastor, Vitamin C.  Diet Order:  Diet NPO time specified  EDUCATION NEEDS:   No education needs have been identified at this time  Skin:  Skin Assessment: Skin Integrity Issues: Skin Integrity Issues:: Stage II Stage II: R hip  Last BM:  11/23  Height:   Ht Readings from Last 1 Encounters:  12/06/16 '5\' 2"'$  (1.575 m)    Weight:   Wt Readings from Last 1 Encounters:  12/17/16 95 lb 14.4 oz (43.5 kg)    Ideal Body Weight:  50 kg  BMI:  Body mass index is 17.54 kg/m.  Estimated Nutritional Needs:   Kcal:  >2000  Protein:  >80 gm  Fluid:  >2 L   Molli Barrows, RD, LDN, CNSC Pager [phone] After Hours Pager [phone]

## 2016-12-17 NOTE — Procedures (Signed)
Pt placed back on full vent support at this time due to increased RR >45, pt tolerating well, RT will monitor

## 2016-12-18 DIAGNOSIS — J9621 Acute and chronic respiratory failure with hypoxia: Secondary | ICD-10-CM

## 2016-12-18 LAB — GLUCOSE, CAPILLARY
Glucose-Capillary: 123 mg/dL — ABNORMAL HIGH (ref 65–99)
Glucose-Capillary: 132 mg/dL — ABNORMAL HIGH (ref 65–99)
Glucose-Capillary: 117 mg/dL — ABNORMAL HIGH (ref 65–99)

## 2016-12-18 NOTE — Progress Notes (Signed)
Pt arrived from 110M per bed Husband at bedside

## 2016-12-18 NOTE — Progress Notes (Signed)
Patient transported from room 2M05 to room 1R94 with no complications. Vitals are stable. RT on floor is with patient in room. Rt will continue to monitor.

## 2016-12-18 NOTE — Progress Notes (Signed)
[facility]   Physician Requesting Consult:Peter Messick, M.D. 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 Known chronic hypoxic respiratory failure. Recently diagnosed with breast cancer having undergone lumpectomy summer 2018. Lives at home with family and does not at times as well as make small attempts to verbalize. Recently she has been found to have a broken right hip and is wheelchair-bound at baseline. Presents with 1 day history of increasing purulent sputum production and increased work of breathing. Her mental status has been slightly worse than usual. No subjective fever, chills, or sweats. No recent sick contacts that the husband reports. Patient has been more restless at home recently. Her saturations have dropped into the 70s at times today prompting her husband to bring her to the emergency room. Through imaging she was subsequently found to have bibasilar infiltrates suspicious for healthcare associated pneumonia and was started on broad-spectrum antiviral therapy.  Subjective: Unable to wean due to fatigue  Review of Systems:  Unable to obtain given ventilator requirement.   Vent Mode: PRVC FiO2 (%):  [40 %] 40 % Set Rate:  [20 bmp] 20 bmp Vt Set:  [420 mL] 420 mL PEEP:  [5 cmH20] 5 cmH20 Plateau Pressure:  [15 cmH20-22 cmH20] 22 cmH20  Temp:  [98.7 F (37.1 C)-99.6 F (37.6 C)] 99.6 F (37.6 C) (11/24 1138) Pulse Rate:  [77-90] 80 (11/24 1400) Resp:  [20-42] 22 (11/24 1400) BP: (108-176)/(44-149) 145/75 (11/24 1400) SpO2:  [98 %-100 %] 100 % (11/24 1400) FiO2 (%):  [40 %] 40 % (11/24 1218) Weight:  [97 lb 14.2 oz (44.4 kg)] 97 lb 14.2 oz (44.4 kg) (11/24 0400)   General: Frail ill-appearing female on ventilator via tracheostomy HEENT: Tracheostomy dry and intact connected to ventilator PSY: Appears agitated Neuro: Follows commands agitated  as rhythmic jerking movements of extremities worsened with agitation CV: Sounds are regular PULM: Mild rhonchi decreased air movement ZO:XWRU, non-tender, bsx4 active  Extremities: Warm dry muscular wasting Skin: no rashes or lesions   LINES/TUBES: Trach #6 (ENT) >>>  PEG >>> Foley >>> PIV  CBC Latest Ref Rng & Units 12/17/2016 12/16/2016 12/15/2016  WBC 4.0 - 10.5 K/uL 8.4 7.1 8.5  Hemoglobin 12.0 - 15.0 g/dL 11.7(L) 11.6(L) 11.5(L)  Hematocrit 36.0 - 46.0 % 37.1 35.9(L) 36.7  Platelets 150 - 400 K/uL 262 228 252    BMP Latest Ref Rng & Units 12/17/2016 12/16/2016 12/15/2016  Glucose 65 - 99 mg/dL 120(H) 113(H) 140(H)  BUN 6 - 20 mg/dL _0 Creatinine 0.44 - 1.00 mg/dL <0.30(L) <0.30(L) 0.31(L)  BUN/Creat Ratio 12 - 28 - - -  Sodium 135 - 145 mmol/L 144 140 139  Potassium 3.5 - 5.1 mmol/L 3.9 3.3(L) 3.0(L)  Chloride 101 - 111 mmol/L 104 100(L) 101  CO2 22 - 32 mmol/L 32 33(H) 31  Calcium 8.9 - 10.3 mg/dL 9.1 8.5(L) 8.2(L)    Hepatic Function Latest Ref Rng & Units 12/17/2016 12/16/2016 12/15/2016  Total Protein 6.5 - 8.1 g/dL - - -  Albumin 3.5 - 5.0 g/dL 2.9(L) 2.6(L) 2.7(L)  AST 15 - 41 U/L - - -  ALT 14 - 54 U/L - - -  Alk Phosphatase 38 - 126 U/L - - -  Total Bilirubin 0.3 - 1.2 mg/dL - - -  Bilirubin, Direct 0.0 - 0.3 mg/dL - - -   IMAGING/STUDIES: PORT CXR 11/11: Previously reviewed by me. And  reticular opacities predominantly in the lung bases with some areas of alveolar filling suggestive of a pneumonitis. PORT CXR 11/19: Previously reviewed by me. Questionable new right lower lung zone opacity. No pleural effusion appreciated. RUQ U/S 11/19:Unremarkable right upper quadrant ultrasound. CT CHEST W/O 11/21:  Previously reviewed by me. Focal dependent opacity posterior segment right lower lobe. Mucous plugging within left lower lobe noted. No pleural effusion. No pneumothorax. PORT CXR 11/22:  Previously reviewed by me. Questionable progression of left  basilar opacity. Low lung volumes today. No new focal opacity appreciated. Still retains good visualization of left hemidiaphragm. Tracheostomy in place.  MICROBIOLOGY: MRSA PCR 11/11:  Positive  Blood Cultures x2 11/11:  Negative  Tracheal Aspirate Culture 11/11:  MRSA Urine Streptococcal Antigen 11/11:  Negative Urine Legionella Antigen 11/11:  Negative  Urine Culture 11/18:  Negative  Blood Cultures x2 11/18 >>>neg Tracheal Aspirate Culture 11/19:  MRSA   ANTIBIOTICS: Merrem 11/11 - 11/13 Diflucan 11/18- 11/20 Nystatin Powder 11/20 >>> Vancomycin 11/11 - 11/17; 11/18 >>> (stop date 11/24) Tressie Ellis 11/18 >>> (stop date 11/24)  SIGNIFICANT EVENTS: 11/11 - Admit 11/12 - Worsening work of breathing & desat >> trach changed to cuffed #4 & placed on full vent support 11/16 - Transfer to vent SDU bed 11/17 - Transferred back to ICU w/ fever 11/21 - Trach up-sized by ENT to #6 cuffed  11/22 - Tolerated limited PS weaning  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 Admitted with acute on chronic hypoxic respiratory failure secondary to pneumonia. Clinically improving. Tolerating limited pressure support weaning.   Acute on chronic respiratory failure.  We will continue to wean on a daily basis.  Sepsis secondary to pneumonia Vanco and Fortaz in place stop date is today  Transaminitis continue to monitor hepatic function  Protein calorie  malnutrition.  Feeds as tolerated  Anemia Recent Labs    12/16/16 0204 12/17/16 0302  HGB 11.6* 11.7*  Monitor CBCs  Continue to monitor electrolytes   Prophylaxis:  Heparin [place] q8hr & Protonix via tube. .  Diet:  NPO. Continuing Tube Feedings. Code Status:  Full Code. Disposition:  Transferring patient to ventilator stepdown bed. PCCM to remain primary. Family Update: 02/18/2016 husband was updated at the bedside.  Critical care time 30 minutes   Richardson Landry Apolonia Ellwood ACNP [facility] PCCM Pager [phone] till 1 pm If no  answer page [phone] 12/18/2016, 2:32 PM

## 2016-12-18 NOTE — Progress Notes (Signed)
Physical Therapy Treatment Patient Details Name: Judy Spears MRN: [id number] DOB: [birth year] Today's Date: 12/18/2016    History of Present Illness 66 yo female with acute on chronic respiratory failure from HCAP. 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 Had initial improvement and transferred to vent SDU. Developed progressive fever, increase WOB, and rash around G tube site. Transferred back to ICU.    PT Comments    Pt continues to require heavy physical assistance of two for all functional mobility. Pt tolerated sitting EOB with total A to maintain upright for ~15 minutes. Initially upon sitting pt with RR in mid 40's but was able to decrease to low 30's with pt's husband performing relaxation techniques. Pt's RN reported that the plan is for pt to transfer to step down unit later today. Pt would continue to benefit from skilled physical therapy services at this time while admitted and after d/c to address the below listed limitations in order to improve overall safety and independence with functional mobility.   Follow Up Recommendations  Home health PT;Supervision/Assistance - 24 hour     Equipment Recommendations  Other (comment)(Hoyer Lift)    Recommendations for Other Services       Precautions / Restrictions Precautions Precautions: Fall Precaution Comments: vent/trach, PEG Restrictions Weight Bearing Restrictions: No    Mobility  Bed Mobility Overal bed mobility: Needs Assistance Bed Mobility: Rolling;Supine to Sit;Sit to Supine Rolling: Total assist;+2 for physical assistance   Supine to sit: Total assist;+2 for physical assistance Sit to supine: Total assist;+2 for physical assistance   General bed mobility comments: total A x2 for all aspects  Transfers                 General transfer comment: deferred at this time; focus of session on sitting balance and tolerance to sitting  Ambulation/Gait                 Stairs            Wheelchair Mobility    Modified Rankin (Stroke Patients Only)       Balance Overall balance assessment: Needs assistance Sitting-balance support: Feet supported Sitting balance-Leahy Scale: Zero Sitting balance - Comments: total A to maintain upright with therapist blocking knees Postural control: Posterior lean                                  Cognition Arousal/Alertness: Awake/alert Behavior During Therapy: Restless Overall Cognitive Status: Difficult to assess                                        Exercises      General Comments        Pertinent Vitals/Pain Pain Assessment: No/denies pain    Home Living                      Prior Function            PT Goals (current goals can now be found in the care plan section) Acute Rehab PT Goals PT Goal Formulation: With family Time For Goal Achievement: 12/27/16 Potential to Achieve Goals: Fair Progress towards PT goals: Progressing toward goals    Frequency    Min 2X/week      PT Plan Current plan remains appropriate  Co-evaluation              AM-PAC PT "6 Clicks" Daily Activity  Outcome Measure  Difficulty turning over in bed (including adjusting bedclothes, sheets and blankets)?: Unable Difficulty moving from lying on back to sitting on the side of the bed? : Unable Difficulty sitting down on and standing up from a chair with arms (e.g., wheelchair, bedside commode, etc,.)?: Unable Help needed moving to and from a bed to chair (including a wheelchair)?: Total Help needed walking in hospital room?: Total Help needed climbing 3-5 steps with a railing? : Total 6 Click Score: 6    End of Session Equipment Utilized During Treatment: Other (comment)(vent) Activity Tolerance: Patient tolerated treatment well Patient left: in bed;with call bell/phone within reach;with family/visitor present Nurse Communication:  Mobility status;Need for lift equipment PT Visit Diagnosis: Other abnormalities of gait and mobility (R26.89);Muscle weakness (generalized) (M62.81)     Time: 0459-9774 PT Time Calculation (min) (ACUTE ONLY): 28 min  Charges:  $Therapeutic Activity: 23-37 mins                    G Codes:       [place], [place], [place] [phone]    [place] 12/18/2016, 3:26 PM

## 2016-12-18 NOTE — Progress Notes (Signed)
Report called to 56M 10 RN, tx w/ RT w/ vss, recvd by RNs and report confirmed at bs.

## 2016-12-18 NOTE — Procedures (Signed)
Pt failed ATC trial at this time due to increased RR >40, inc WOB, pt placed back on full vent support, will attempt wean later this am as tolerated.

## 2016-12-19 ENCOUNTER — Inpatient Hospital Stay (HOSPITAL_COMMUNITY): Payer: Medicare Other

## 2016-12-19 LAB — CBC WITH DIFFERENTIAL/PLATELET
Basophils Absolute: 0 10*3/uL (ref 0.0–0.1)
Basophils Relative: 0 %
Eosinophils Absolute: 0.2 10*3/uL (ref 0.0–0.7)
Eosinophils Relative: 3 %
HEMATOCRIT: 35.1 % — AB (ref 36.0–46.0)
HEMOGLOBIN: 10.9 g/dL — AB (ref 12.0–15.0)
LYMPHS ABS: 1 10*3/uL (ref 0.7–4.0)
LYMPHS PCT: 19 %
MCH: 28.5 pg (ref 26.0–34.0)
MCHC: 31.1 g/dL (ref 30.0–36.0)
MCV: 91.6 fL (ref 78.0–100.0)
MONOS PCT: 10 %
Monocytes Absolute: 0.6 10*3/uL (ref 0.1–1.0)
NEUTROS ABS: 3.7 10*3/uL (ref 1.7–7.7)
NEUTROS PCT: 68 %
Platelets: 312 10*3/uL (ref 150–400)
RBC: 3.83 MIL/uL — ABNORMAL LOW (ref 3.87–5.11)
RDW: 17.3 % — ABNORMAL HIGH (ref 11.5–15.5)
WBC: 5.5 10*3/uL (ref 4.0–10.5)

## 2016-12-19 LAB — GLUCOSE, CAPILLARY
GLUCOSE-CAPILLARY: 169 mg/dL — AB (ref 65–99)
GLUCOSE-CAPILLARY: 136 mg/dL — AB (ref 65–99)
GLUCOSE-CAPILLARY: 99 mg/dL (ref 65–99)
Glucose-Capillary: 117 mg/dL — ABNORMAL HIGH (ref 65–99)

## 2016-12-19 LAB — BASIC METABOLIC PANEL
ANION GAP: 5 (ref 5–15)
BUN: 10 mg/dL (ref 6–20)
CALCIUM: 8.2 mg/dL — AB (ref 8.9–10.3)
CO2: 25 mmol/L (ref 22–32)
Chloride: 107 mmol/L (ref 101–111)
Creatinine, Ser: 0.76 mg/dL (ref 0.44–1.00)
GFR calc non Af Amer: >60 mL/min (ref >60)
Glucose, Bld: 152 mg/dL — ABNORMAL HIGH (ref 65–99)
Potassium: 3.6 mmol/L (ref 3.5–5.1)
Sodium: 137 mmol/L (ref 135–145)

## 2016-12-19 LAB — RENAL FUNCTION PANEL
ANION GAP: 6 (ref 5–15)
Albumin: 2.2 g/dL — ABNORMAL LOW (ref 3.5–5.0)
BUN: 10 mg/dL (ref 6–20)
CALCIUM: 8.3 mg/dL — AB (ref 8.9–10.3)
CHLORIDE: 107 mmol/L (ref 101–111)
CO2: 25 mmol/L (ref 22–32)
Creatinine, Ser: 0.78 mg/dL (ref 0.44–1.00)
GFR calc non Af Amer: >60 mL/min (ref >60)
Glucose, Bld: 145 mg/dL — ABNORMAL HIGH (ref 65–99)
Phosphorus: 1.5 mg/dL — ABNORMAL LOW (ref 2.5–4.6)
Potassium: 3.8 mmol/L (ref 3.5–5.1)
SODIUM: 138 mmol/L (ref 135–145)

## 2016-12-19 LAB — PHOSPHORUS: PHOSPHORUS: 1.4 mg/dL — AB (ref 2.5–4.6)

## 2016-12-19 LAB — MAGNESIUM: Magnesium: 1.8 mg/dL (ref 1.7–2.4)

## 2016-12-19 MED ORDER — POTASSIUM PHOSPHATES 15 MMOLE/5ML IV SOLN
30.0000 mmol | Freq: Once | INTRAVENOUS | Status: AC
  Administered 2016-12-19: 30 mmol via INTRAVENOUS
  Filled 2016-12-19: qty 10

## 2016-12-19 MED ORDER — FREE WATER
200.0000 mL | Freq: Four times a day (QID) | Status: DC
Start: 2016-12-19 — End: 2016-12-28
  Administered 2016-12-19 – 2016-12-28 (×35): 200 mL

## 2016-12-19 NOTE — Progress Notes (Signed)
Did not do cpt at this time due to being on trach collar

## 2016-12-19 NOTE — Progress Notes (Signed)
Patient did well on Trach collar today without difficulty.  However, she did do better with (2) doses of  Fentanyl 50 mcg at times when she said yes she was in pain.  Husband stated he did not want her to take much or nay pain med.  Discussed with him the importance of taking pain med and respiration rate 40 plus while on trach collar and her hip hurting. After first dose of Fentanyl patient rested for 40 mins and tremors completed stopped 130/70, 76, 20, 96% approx 1000. 2nd dose of Fentanyl afternoon patient rested 30 mins and tolerated head washing with decreasing in tremors and smiled for 5 mins. Husband stated at 22 "Well kay, I think this is the best day you have had in (2) weeks!"

## 2016-12-19 NOTE — Progress Notes (Signed)
[facility]   Physician Requesting Consult:Peter Messick, M.D. 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 Known chronic hypoxic respiratory failure. Recently diagnosed with breast cancer having undergone lumpectomy summer 2018. Lives at home with family and does not at times as well as make small attempts to verbalize. Recently she has been found to have a broken right hip and is wheelchair-bound at baseline. Presents with 1 day history of increasing purulent sputum production and increased work of breathing. Her mental status has been slightly worse than usual. No subjective fever, chills, or sweats. No recent sick contacts that the husband reports. Patient has been more restless at home recently. Her saturations have dropped into the 70s at times today prompting her husband to bring her to the emergency room. Through imaging she was subsequently found to have bibasilar infiltrates suspicious for healthcare associated pneumonia and was started on broad-spectrum antiviral therapy.  Subjective: Tolerating trach collar for first time today.  Review of Systems:  Unable to obtain given ventilator requirement.   Vent Mode: PRVC FiO2 (%):  [40 %] 40 % Set Rate:  [20 bmp] 20 bmp Vt Set:  [420 mL] 420 mL PEEP:  [5 cmH20] 5 cmH20 Plateau Pressure:  [16 cmH20-21 cmH20] 21 cmH20  Temp:  [98.3 F (36.8 C)-99 F (37.2 C)] 98.3 F (36.8 C) (11/25 0701) Pulse Rate:  [76-88] 83 (11/25 1220) Resp:  [19-38] 27 (11/25 1220) BP: (101-202)/(54-117) 161/84 (11/25 1220) SpO2:  [89 %-100 %] 99 % (11/25 1220) FiO2 (%):  [40 %] 40 % (11/25 1220) Weight:  [88 lb 2.9 oz (40 kg)-99 lb 6.8 oz (45.1 kg)] 99 lb 6.8 oz (45.1 kg) (11/25 0413)   General: Frail wasted female. HEENT: Tracheostomy in place.  Currently  tolerating trach collar PSY: Difficult to assess Neuro: Follows commands.   Jerking motion of all extremities when stimulated. CV: Heart sounds are regular PULM: Coarse rhonchi bilaterally GI: Positive bowel sounds Extremities: 3 muscle wasting of extremities Skin: no rashes or lesions    LINES/TUBES: Trach #6 (ENT) >>>  PEG >>> Foley >>> PIV  CBC Latest Ref Rng & Units 12/19/2016 12/17/2016 12/16/2016  WBC 4.0 - 10.5 K/uL 5.5 8.4 7.1  Hemoglobin 12.0 - 15.0 g/dL 10.9(L) 11.7(L) 11.6(L)  Hematocrit 36.0 - 46.0 % 35.1(L) 37.1 35.9(L)  Platelets 150 - 400 K/uL 312 262 228    BMP Latest Ref Rng & Units 12/19/2016 12/17/2016 12/16/2016  Glucose 65 - 99 mg/dL 152(H) 120(H) 113(H)  BUN 6 - 20 mg/dL '10 18 15  '$ Creatinine 0.44 - 1.00 mg/dL 0.76 <0.30(L) <0.30(L)  BUN/Creat Ratio 12 - 28 - - -  Sodium 135 - 145 mmol/L 137 144 140  Potassium 3.5 - 5.1 mmol/L 3.6 3.9 3.3(L)  Chloride 101 - 111 mmol/L 107 104 100(L)  CO2 22 - 32 mmol/L 25 32 33(H)  Calcium 8.9 - 10.3 mg/dL 8.2(L) 9.1 8.5(L)    Hepatic Function Latest Ref Rng & Units 12/17/2016 12/16/2016 12/15/2016  Total Protein 6.5 - 8.1 g/dL - - -  Albumin 3.5 - 5.0 g/dL 2.9(L) 2.6(L) 2.7(L)  AST 15 - 41 U/L - - -  ALT 14 - 54 U/L - - -  Alk Phosphatase 38 - 126 U/L - - -  Total Bilirubin 0.3 - 1.2 mg/dL - - -  Bilirubin, Direct 0.0 - 0.3 mg/dL - - -   IMAGING/STUDIES: PORT CXR 11/11: Previously reviewed  by me. And reticular opacities predominantly in the lung bases with some areas of alveolar filling suggestive of a pneumonitis. PORT CXR 11/19: Previously reviewed by me. Questionable new right lower lung zone opacity. No pleural effusion appreciated. RUQ U/S 11/19:Unremarkable right upper quadrant ultrasound. CT CHEST W/O 11/21:  Previously reviewed by me. Focal dependent opacity posterior segment right lower lobe. Mucous plugging within left lower lobe noted. No pleural effusion. No pneumothorax. PORT CXR 11/22:  Previously reviewed by me. Questionable progression of left basilar opacity. Low lung  volumes today. No new focal opacity appreciated. Still retains good visualization of left hemidiaphragm. Tracheostomy in place.  MICROBIOLOGY: MRSA PCR 11/11:  Positive  Blood Cultures x2 11/11:  Negative  Tracheal Aspirate Culture 11/11:  MRSA Urine Streptococcal Antigen 11/11:  Negative Urine Legionella Antigen 11/11:  Negative  Urine Culture 11/18:  Negative  Blood Cultures x2 11/18 >>>neg Tracheal Aspirate Culture 11/19:  MRSA   ANTIBIOTICS: Merrem 11/11 - 11/13 Diflucan 11/18- 11/20 Nystatin Powder 11/20 >>> Vancomycin 11/11 - 11/17; 11/18 >>> (stop date 11/24) Tressie Ellis 11/18 >>> (stop date 11/24)  SIGNIFICANT EVENTS: 11/11 - Admit 11/12 - Worsening work of breathing & desat >> trach changed to cuffed #4 & placed on full vent support 11/16 - Transfer to vent SDU bed 11/17 - Transferred back to ICU w/ fever 11/21 - Trach up-sized by ENT to #6 cuffed  11/22 - Tolerated limited PS weaning  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 Admitted with acute on chronic hypoxic respiratory failure secondary to pneumonia. Clinically improving. Tolerating limited pressure support weaning.   1.  Acute on chronic hypoxic respiratory failure requiring tracheostomy and mechanical ventilatory support.  12/19/2016 she is tolerating trach collar unknown how long she will tolerated today.  2.  Resolving sepsis antibiotics completed.  Continue to monitor  3. History of MRSA.  Vancomycin and Fortaz completed.  Remains on Valtrex  4.  Severe malnutrition continue current feeds.  5.  History of encephalitis.  Able to follow commands.  Able to communicate.   She has been transferred to a stepdown unit as of 12/18/2016.  She is continued on multiple antibiotics for recurrent infections.  She has had severe malnutrition.  12/19/2016 will be the first day she been on trach collar in an extended period of time.  We will continue current therapy and intervention.   Prophylaxis:   Heparin [place] q8hr & Protonix via tube. .  Diet:  NPO. Continuing Tube Feedings. Code Status:  Full Code. Disposition:  Transferring patient to ventilator stepdown bed. PCCM to remain primary. Family Update: 02/19/2016 husband was updated at the bedside.     Richardson Landry Candra Wegner ACNP [facility] PCCM Pager [phone] till 1 pm If no answer page [phone] 12/19/2016, 2:39 PM

## 2016-12-20 ENCOUNTER — Inpatient Hospital Stay (HOSPITAL_COMMUNITY): Payer: Medicare Other

## 2016-12-20 DIAGNOSIS — R0602 Shortness of breath: Secondary | ICD-10-CM

## 2016-12-20 DIAGNOSIS — R0603 Acute respiratory distress: Secondary | ICD-10-CM

## 2016-12-20 DIAGNOSIS — J9600 Acute respiratory failure, unspecified whether with hypoxia or hypercapnia: Secondary | ICD-10-CM

## 2016-12-20 LAB — BASIC METABOLIC PANEL
ANION GAP: 8 (ref 5–15)
BUN: 17 mg/dL (ref 6–20)
CHLORIDE: 103 mmol/L (ref 101–111)
CO2: 29 mmol/L (ref 22–32)
Calcium: 8.8 mg/dL — ABNORMAL LOW (ref 8.9–10.3)
Glucose, Bld: 92 mg/dL (ref 65–99)
POTASSIUM: 3.5 mmol/L (ref 3.5–5.1)
SODIUM: 140 mmol/L (ref 135–145)

## 2016-12-20 LAB — GLUCOSE, CAPILLARY
GLUCOSE-CAPILLARY: 137 mg/dL — AB (ref 65–99)
GLUCOSE-CAPILLARY: 151 mg/dL — AB (ref 65–99)
GLUCOSE-CAPILLARY: 97 mg/dL (ref 65–99)
GLUCOSE-CAPILLARY: 131 mg/dL — AB (ref 65–99)
Glucose-Capillary: 108 mg/dL — ABNORMAL HIGH (ref 65–99)
Glucose-Capillary: 110 mg/dL — ABNORMAL HIGH (ref 65–99)

## 2016-12-20 LAB — RENAL FUNCTION PANEL
ANION GAP: 8 (ref 5–15)
Albumin: 2.9 g/dL — ABNORMAL LOW (ref 3.5–5.0)
BUN: 20 mg/dL (ref 6–20)
CALCIUM: 9 mg/dL (ref 8.9–10.3)
CO2: 30 mmol/L (ref 22–32)
Chloride: 103 mmol/L (ref 101–111)
Creatinine, Ser: 0.31 mg/dL — ABNORMAL LOW (ref 0.44–1.00)
GFR calc non Af Amer: >60 mL/min (ref >60)
Glucose, Bld: 129 mg/dL — ABNORMAL HIGH (ref 65–99)
PHOSPHORUS: 4.8 mg/dL — AB (ref 2.5–4.6)
Potassium: 3.9 mmol/L (ref 3.5–5.1)
SODIUM: 141 mmol/L (ref 135–145)

## 2016-12-20 MED ORDER — CHLORHEXIDINE GLUCONATE 0.12 % MT SOLN
15.0000 mL | Freq: Two times a day (BID) | OROMUCOSAL | Status: DC
  Administered 2016-12-20 – 2017-01-04 (×31): 15 mL via OROMUCOSAL
  Filled 2016-12-20 (×21): qty 15

## 2016-12-20 MED ORDER — BETHANECHOL CHLORIDE 10 MG PO TABS
5.0000 mg | ORAL_TABLET | Freq: Three times a day (TID) | ORAL | Status: DC
  Administered 2016-12-20 – 2016-12-27 (×20): 5 mg via ORAL
  Filled 2016-12-20 (×20): qty 1

## 2016-12-20 MED ORDER — ORAL CARE MOUTH RINSE
15.0000 mL | Freq: Two times a day (BID) | OROMUCOSAL | Status: DC
  Administered 2016-12-20 – 2017-01-04 (×30): 15 mL via OROMUCOSAL

## 2016-12-20 NOTE — Plan of Care (Signed)
No significant changes at this time

## 2016-12-20 NOTE — Progress Notes (Signed)
[facility]   Physician Requesting Consult:Peter Messick, M.D. 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 Known chronic hypoxic respiratory failure. Recently diagnosed with breast cancer having undergone lumpectomy summer 2018. Lives at home with family and does not at times as well as make small attempts to verbalize. Recently she has been found to have a broken right hip and is wheelchair-bound at baseline. Presents with 1 day history of increasing purulent sputum production and increased work of breathing. Her mental status has been slightly worse than usual. No subjective fever, chills, or sweats. No recent sick contacts that the husband reports. Patient has been more restless at home recently. Her saturations have dropped into the 70s at times today prompting her husband to bring her to the emergency room. Through imaging she was subsequently found to have bibasilar infiltrates suspicious for healthcare associated pneumonia and was started on broad-spectrum antiviral therapy.  Subjective:  Tolerating 40% ATC, no distress. Family concerned about foley, would like to remove it.  No acute events overnight. Tmax 100.3 / WBC 5.5.     Vent Mode: PRVC FiO2 (%):  [0 %-40 %] 40 % Set Rate:  [20 bmp] 20 bmp Vt Set:  [420 mL] 420 mL PEEP:  [5 cmH20] 5 cmH20 Plateau Pressure:  [17 VCB44-96 cmH20] 17 cmH20  Temp:  [98.3 F (36.8 C)-100.3 F (37.9 C)] 99 F (37.2 C) (11/26 1321) Pulse Rate:  [76-86] 79 (11/26 1418) Resp:  [18-39] 28 (11/26 1418) BP: (125-163)/(65-98) 149/70 (11/26 1418) SpO2:  [93 %-100 %] 100 % (11/26 1419) FiO2 (%):  [0 %-40 %] 40 % (11/26 1419) Weight:  [102 lb 11.8 oz (46.6 kg)] 102 lb 11.8 oz (46.6 kg) (11/26 0350)   General:  Frail adult female, lying in bed HEENT: MM pink/moist, no jvd, invisalign in place Neuro: calm, tracks, follows commands,  jerking motion in all extremities with stimulation  CV: s1s2 rrr, no m/r/g PULM: even/non-labored, lungs bilaterally clear anterior  GI: soft, non-tender, bsx4 active  Extremities: warm/dry, no edema , muscle wasting of extremities  Skin: no rashes or lesions   CBC Latest Ref Rng & Units 12/19/2016 12/17/2016 12/16/2016  WBC 4.0 - 10.5 K/uL 5.5 8.4 7.1  Hemoglobin 12.0 - 15.0 g/dL 10.9(L) 11.7(L) 11.6(L)  Hematocrit 36.0 - 46.0 % 35.1(L) 37.1 35.9(L)  Platelets 150 - 400 K/uL 312 262 228    BMP Latest Ref Rng & Units 12/20/2016 12/19/2016 12/19/2016  Glucose 65 - 99 mg/dL 129(H) 145(H) 152(H)  BUN 6 - 20 mg/dL _0 Creatinine 0.44 - 1.00 mg/dL 0.31(L) 0.78 0.76  BUN/Creat Ratio 12 - 28 - - -  Sodium 135 - 145 mmol/L 141 138 137  Potassium 3.5 - 5.1 mmol/L 3.9 3.8 3.6  Chloride 101 - 111 mmol/L 103 107 107  CO2 22 - 32 mmol/L _1 Calcium 8.9 - 10.3 mg/dL 9.0 8.3(L) 8.2(L)    Hepatic Function Latest Ref Rng & Units 12/20/2016 12/19/2016 12/17/2016  Total Protein 6.5 - 8.1 g/dL - - -  Albumin 3.5 - 5.0 g/dL 2.9(L) 2.2(L) 2.9(L)  AST 15 - 41 U/L - - -  ALT 14 - 54 U/L - - -  Alk Phosphatase 38 - 126 U/L - - -  Total Bilirubin 0.3 - 1.2 mg/dL - - -  Bilirubin, Direct 0.0 - 0.3 mg/dL - - -   IMAGING/STUDIES: PORT CXR 11/11:  Previously reviewed by me. And reticular opacities predominantly in the lung bases with some areas of alveolar filling suggestive of a pneumonitis. PORT CXR 11/19: Previously reviewed by me. Questionable new right lower lung zone opacity. No pleural effusion appreciated. RUQ U/S 11/19:Unremarkable right upper quadrant ultrasound. CT CHEST W/O 11/21:  Previously reviewed by me. Focal dependent opacity posterior segment right lower lobe. Mucous plugging within left lower lobe noted. No pleural effusion. No pneumothorax. PORT CXR 11/22:  Previously reviewed by me. Questionable progression of left basilar opacity. Low lung volumes today. No new focal  opacity appreciated. Still retains good visualization of left hemidiaphragm. Tracheostomy in place.  MICROBIOLOGY: MRSA PCR 11/11:  Positive  Blood Cultures x2 11/11:  Negative  Tracheal Aspirate Culture 11/11:  MRSA Urine Streptococcal Antigen 11/11:  Negative Urine Legionella Antigen 11/11:  Negative  Urine Culture 11/18:  Negative  Blood Cultures x2 11/18 >>>neg Tracheal Aspirate Culture 11/19:  MRSA   ANTIBIOTICS: Merrem 11/11 - 11/13 Diflucan 11/18- 11/20 Nystatin Powder 11/20 >>> Vancomycin 11/11 - 11/17; 11/18 >>> (stop date 11/24) Tressie Ellis 11/18 >>> (stop date 11/24)  LINES/TUBES: Trach #6 (ENT) >>>  PEG >>> Foley >>> PIV  SIGNIFICANT EVENTS: 11/11 - Admit 11/12 - Worsening work of breathing & desat >> trach changed to cuffed #4 & placed on full vent support 11/16 - Transfer to vent SDU bed 11/17 - Transferred back to ICU w/ fever 11/21 - Trach up-sized by ENT to #6 cuffed  11/22 - Tolerated limited PS weaning 11/24 - To SDU  11/25 - ATC x 8-9 hours  11/26 - ATC 40%  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 Admitted with acute on chronic hypoxic respiratory failure secondary to pneumonia. Clinically improving. Tolerating limited pressure support weaning.   1.  Acute on chronic hypoxic respiratory failure requiring tracheostomy and mechanical ventilatory support.   P: Progress ATC weaning as tolerated  Keep vent in room for now  Push PT efforts  Follow up CXR in am   2.  Resolving sepsis antibiotics completed P: Monitor off abx, had almost 2 weeks therapy   3. History of MRSA.   P: Monitor off abx    4.  Severe malnutrition  P: Continue TF  5.  History of encephalitis - able to follow commands / communicate P: Supportive care  PT efforts   6.  Urinary Retention  P: Discontinue foley  Prior success with urecholine > begin '5mg'$  TID and titrate for effect  I/O cath as needed   7.  ? AKI vs erroneous lab reading - low muscle  mass, has resolved P: Follow up BMP in am     Prophylaxis:  Heparin [place] q8hr & Protonix via tube. .  Diet:  NPO. Continue Tube Feedings. Code Status:  Full Code. Disposition:  Ventilator stepdown bed. PCCM to remain primary. Family Update: Family updated at bedside 11/26     Noe Gens, NP-C [facility] Pgr: [phone] or if no answer [phone] 12/20/2016, 4:07 PM   STAFF NOTE: I, Merrie Roof, MD FACP have personally reviewed patient's available data, including medical history, events of note, physical examination and test results as part of my evaluation. I have discussed with resident/NP and other care providers such as pharmacist, RN and RRT. In addition, I personally evaluated patient and elicited key findings of: awake, at baseline neurostatus, NO distress at all on TC, secretions minimal, CTA anterior, abdo BS low, no edema, chronic changes, pcxr lastI reviewed showed mild  rt base atx, CT chest I also reviewed with atx rt base, she is resolving MRSA pNA well, in fact recent CT without any sig infiltrates, we should continue trach collar to goal 24 hours , keep on pulse ox / tele and close observation in SDU, TC intervals 11-05-12 will only prolong VAP risk, renal fxn was up and now back to baseline, error? ATN?, monitor output and crt, to dc lasix and start urocholine as she has responded In past to this, musle mass is low making crt not such a reasonable marker of renal fx, place female condom and assess output, has required straight cath in past, bladder scan in 4-6 hours if No output, I updatd daughter and pt in room   Lavon Paganini. Titus Mould, MD, [place] Pgr: [facility] 12/20/2016 8:21 PM

## 2016-12-20 NOTE — Care Management Note (Signed)
Case Management Note  Patient Details  Name: Judy Spears MRN: [id number] Date of Birth: [birth year]  Subjective/Objective:  Transfer from ICU, From home, presents with resp failure ,hcap , chronic trach, w/ chair bound.  Per previous NCM note, patient is total care and has 24 hr caregiver, is transported by private vehicle.                  Action/Plan: NCM will follow for dc need.  Expected Discharge Date:                  Expected Discharge Plan:  [place]  In-House Referral:     Discharge planning Services  CM Consult  Post Acute Care Choice:    Choice offered to:     DME Arranged:    DME Agency:     HH Arranged:    HH Agency:     Status of Service:     If discussed at [employer], dates discussed:    Additional Comments:  Zenon Mayo, RN 12/20/2016, 10:33 AM

## 2016-12-20 NOTE — Progress Notes (Signed)
This note also relates to the following rows which could not be included: SpO2 - Cannot attach notes to unvalidated device data  Rt placed patient on ATC 10L/40% O2. Vitals are stable and sats are 100%. RT will continue to monitor.

## 2016-12-21 LAB — GLUCOSE, CAPILLARY
GLUCOSE-CAPILLARY: 101 mg/dL — AB (ref 65–99)
GLUCOSE-CAPILLARY: 109 mg/dL — AB (ref 65–99)
GLUCOSE-CAPILLARY: 117 mg/dL — AB (ref 65–99)
GLUCOSE-CAPILLARY: 132 mg/dL — AB (ref 65–99)
Glucose-Capillary: 100 mg/dL — ABNORMAL HIGH (ref 65–99)
Glucose-Capillary: 102 mg/dL — ABNORMAL HIGH (ref 65–99)
Glucose-Capillary: 115 mg/dL — ABNORMAL HIGH (ref 65–99)

## 2016-12-21 LAB — CBC
HEMATOCRIT: 37.8 % (ref 36.0–46.0)
HEMOGLOBIN: 12.1 g/dL (ref 12.0–15.0)
MCH: 29.4 pg (ref 26.0–34.0)
MCHC: 32 g/dL (ref 30.0–36.0)
MCV: 91.7 fL (ref 78.0–100.0)
Platelets: 318 10*3/uL (ref 150–400)
RBC: 4.12 MIL/uL (ref 3.87–5.11)
RDW: 16.8 % — ABNORMAL HIGH (ref 11.5–15.5)
WBC: 5.3 10*3/uL (ref 4.0–10.5)

## 2016-12-21 NOTE — Progress Notes (Signed)
[facility]   Physician Requesting Consult:Peter Messick, M.D. 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 Known chronic hypoxic respiratory failure. Recently diagnosed with breast cancer having undergone lumpectomy summer 2018. Lives at home with family and does not at times as well as make small attempts to verbalize. Recently she has been found to have a broken right hip and is wheelchair-bound at baseline. Presents with 1 day history of increasing purulent sputum production and increased work of breathing. Her mental status has been slightly worse than usual. No subjective fever, chills, or sweats. No recent sick contacts that the husband reports. Patient has been more restless at home recently. Her saturations have dropped into the 70s at times today prompting her husband to bring her to the emergency room. Through imaging she was subsequently found to have bibasilar infiltrates suspicious for healthcare associated pneumonia and was started on broad-spectrum antiviral therapy.  Subjective:  off vent 24 hours Foley out, did not have to be put back   FiO2 (%):  [28 %-40 %] 28 %  Temp:  [97.7 F (36.5 C)-99.5 F (37.5 C)] 98.3 F (36.8 C) (11/27 1256) Pulse Rate:  [74-83] 82 (11/27 1256) Resp:  [24-40] 25 (11/27 1256) BP: (117-166)/(55-95) 135/55 (11/27 1256) SpO2:  [90 %-100 %] 90 % (11/27 1256) FiO2 (%):  [28 %-40 %] 28 % (11/27 1123) Weight:  [45.6 kg (100 lb 8.5 oz)] 45.6 kg (100 lb 8.5 oz) (11/27 0311)   General: awake, at baseline appearance Neuro: baseline function HEENT:trach site clean PULM: coarse mild CV: s1 s2 rrr no r GI: soft, BS wnl, no r Extremities: edema chronic changes     CBC Latest Ref Rng & Units 12/21/2016 12/19/2016 12/17/2016  WBC 4.0 - 10.5 K/uL 5.3 5.5 8.4  Hemoglobin 12.0 - 15.0 g/dL 12.1 10.9(L) 11.7(L)  Hematocrit 36.0 -  46.0 % 37.8 35.1(L) 37.1  Platelets 150 - 400 K/uL 318 312 262    BMP Latest Ref Rng & Units 12/20/2016 12/20/2016 12/19/2016  Glucose 65 - 99 mg/dL 92 129(H) 145(H)  BUN 6 - 20 mg/dL '17 20 10  '$ Creatinine 0.44 - 1.00 mg/dL <0.30(L) 0.31(L) 0.78  BUN/Creat Ratio 12 - 28 - - -  Sodium 135 - 145 mmol/L 140 141 138  Potassium 3.5 - 5.1 mmol/L 3.5 3.9 3.8  Chloride 101 - 111 mmol/L 103 103 107  CO2 22 - 32 mmol/L '29 30 25  '$ Calcium 8.9 - 10.3 mg/dL 8.8(L) 9.0 8.3(L)    Hepatic Function Latest Ref Rng & Units 12/20/2016 12/19/2016 12/17/2016  Total Protein 6.5 - 8.1 g/dL - - -  Albumin 3.5 - 5.0 g/dL 2.9(L) 2.2(L) 2.9(L)  AST 15 - 41 U/L - - -  ALT 14 - 54 U/L - - -  Alk Phosphatase 38 - 126 U/L - - -  Total Bilirubin 0.3 - 1.2 mg/dL - - -  Bilirubin, Direct 0.0 - 0.3 mg/dL - - -   IMAGING/STUDIES: PORT CXR 11/11: Previously reviewed by me. And reticular opacities predominantly in the lung bases with some areas of alveolar filling suggestive of a pneumonitis. PORT CXR 11/19: Previously reviewed by me. Questionable new right lower lung zone opacity. No pleural effusion appreciated. RUQ U/S 11/19:Unremarkable right upper quadrant ultrasound. CT CHEST W/O 11/21:  Previously reviewed by me. Focal dependent opacity posterior segment right lower lobe. Mucous plugging within left lower lobe noted. No pleural effusion.  No pneumothorax. PORT CXR 11/22:  Previously reviewed by me. Questionable progression of left basilar opacity. Low lung volumes today. No new focal opacity appreciated. Still retains good visualization of left hemidiaphragm. Tracheostomy in place.  MICROBIOLOGY: MRSA PCR 11/11:  Positive  Blood Cultures x2 11/11:  Negative  Tracheal Aspirate Culture 11/11:  MRSA Urine Streptococcal Antigen 11/11:  Negative Urine Legionella Antigen 11/11:  Negative  Urine Culture 11/18:  Negative  Blood Cultures x2 11/18 >>>neg Tracheal Aspirate Culture 11/19:  MRSA    ANTIBIOTICS: Merrem 11/11 - 11/13 Diflucan 11/18- 11/20 Nystatin Powder 11/20 >>> Vancomycin 11/11 - 11/17; 11/18 >>> (stop date 11/24) Tressie Ellis 11/18 >>> (stop date 11/24)  LINES/TUBES: Trach #6 (ENT) >>>  PEG >>> Foley >>>11/26 PIV  SIGNIFICANT EVENTS: 11/11 - Admit 11/12 - Worsening work of breathing & desat >> trach changed to cuffed #4 & placed on full vent support 11/16 - Transfer to vent SDU bed 11/17 - Transferred back to ICU w/ fever 11/21 - Trach up-sized by ENT to #6 cuffed  11/22 - Tolerated limited PS weaning 11/24 - To SDU  11/25 - ATC x 8-9 hours  11/26 - ATC 40%  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 Admitted with acute on chronic hypoxic respiratory failure secondary to pneumonia. Clinically improving. Tolerating limited pressure support weaning.   A Acute on chronic hypoxic respiratory failure requiring tracheostomy and mechanical ventilatory support.   Severe malnutrition  History of encephalitis - able to follow commands / communicate Urinary Retention Abnormal renal fxn that resolved, error?  P: crt is WNL for low muscle mass She did very well off vent TC 24 hours She normally is cap, she is not ready to cap Can consider PMV in am, will order urocholine to remain, does well with this, foley is out, making urine Output may not be accurate, was pos 2.2 liters, monitor for edema  Will limit blood draws, to avoid anemia Cuff can be down Would NOT downsize while in hospital  I updated daughter in room  Lavon Paganini. Titus Mould, MD, [place] Pgr: [facility] 12/21/2016 1:06 PM

## 2016-12-21 NOTE — Progress Notes (Signed)
Physical Therapy Treatment Patient Details Name: Judy Spears MRN: [id number] DOB: [birth year] Today's Date: 12/21/2016    History of Present Illness 66 yo female with acute on chronic respiratory failure from HCAP. 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 Had initial improvement and transferred to vent SDU. Developed progressive fever, increase WOB, and rash around G tube site. Transferred back to ICU.    PT Comments    Pt admitted with above diagnosis. Pt currently with functional limitations due to balance, strength and endurance deficits. Pt was able to sit EOB with total assist of 2 and worked on trunk stability.  Squat pivot to recliner with use of pad.  Positioned pt well in chair with pillows with pt upright and smiling.  Will continue acute PT.   Pt will benefit from skilled PT to increase their independence and safety with mobility to allow discharge to the venue listed below.     Follow Up Recommendations  Home health PT;Supervision/Assistance - 24 hour     Equipment Recommendations  Other (comment)(Hoyer Lift)    Recommendations for Other Services       Precautions / Restrictions Precautions Precautions: Fall Precaution Comments: vent/trach, PEG Restrictions Weight Bearing Restrictions: No    Mobility  Bed Mobility Overal bed mobility: Needs Assistance Bed Mobility: Rolling;Supine to Sit;Sit to Supine Rolling: Total assist;+2 for physical assistance   Supine to sit: Total assist;+2 for physical assistance Sit to supine: Total assist;+2 for physical assistance   General bed mobility comments: total A x2 for all aspects  Transfers Overall transfer level: Needs assistance Equipment used: 2 person hand held assist(Lateral draw sheet transfer) Transfers: Sit to/from [employer] Sit to Stand: +2 safety/equipment;+2 physical assistance;Total assist   Squat pivot transfers: Total assist;+2 physical  assistance;+2 safety/equipment     General transfer comment: Pt was able to perform squat pivot with little weight on bil LEs for transfer with use of pad.  Pt has difficulty with trunk stability.    Ambulation/Gait             General Gait Details: non ambulatory   Stairs            Wheelchair Mobility    Modified Rankin (Stroke Patients Only)       Balance Overall balance assessment: Needs assistance Sitting-balance support: Feet supported;No upper extremity supported Sitting balance-Leahy Scale: Zero Sitting balance - Comments: total A to maintain upright with therapist blocking knees.  When pt sits EOB, pt will kick LEs into extension impulsively and needs cues to "calm down" per caregiver.  Pt does relax with cues.  Pt also sits with left side flexion but did work with pt on leaning onto right elbow and pt was able to sit a little more upright after working on that but still needs constant external support.  Postural control: Posterior lean;Left lateral lean   Standing balance-Leahy Scale: Zero                              Cognition Arousal/Alertness: Awake/alert Behavior During Therapy: Restless Overall Cognitive Status: Difficult to assess                                        Exercises      General Comments        Pertinent Vitals/Pain  Pain Assessment: No/denies pain  28% trach collar with VSS.    Home Living                      Prior Function            PT Goals (current goals can now be found in the care plan section) Acute Rehab PT Goals Patient Stated Goal: to be able to pivot transfer from bed to wheel chair Progress towards PT goals: Progressing toward goals    Frequency    Min 2X/week      PT Plan Current plan remains appropriate    Co-evaluation              AM-PAC PT "6 Clicks" Daily Activity  Outcome Measure  Difficulty turning over in bed (including adjusting bedclothes,  sheets and blankets)?: Unable Difficulty moving from lying on back to sitting on the side of the bed? : Unable Difficulty sitting down on and standing up from a chair with arms (e.g., wheelchair, bedside commode, etc,.)?: Unable Help needed moving to and from a bed to chair (including a wheelchair)?: Total Help needed walking in hospital room?: Total Help needed climbing 3-5 steps with a railing? : Total 6 Click Score: 6    End of Session Equipment Utilized During Treatment: Other (comment);Gait belt(vent) Activity Tolerance: Patient limited by fatigue Patient left: with call bell/phone within reach;with family/visitor present;in chair Nurse Communication: Mobility status;Need for lift equipment PT Visit Diagnosis: Other abnormalities of gait and mobility (R26.89);Muscle weakness (generalized) (M62.81) Pain - Right/Left: Right Pain - part of body: Hip     Time: 7517-0017 PT Time Calculation (min) (ACUTE ONLY): 26 min  Charges:  $Therapeutic Activity: 23-37 mins                    G Codes:       Catrell Morrone,PT Acute Rehabilitation [phone] (pager)    [name] 12/21/2016, 11:48 AM

## 2016-12-21 NOTE — Progress Notes (Addendum)
Nutrition Follow Up  DOCUMENTATION CODES:   Underweight  INTERVENTION:    Continue Osmolite 1.5 at 90 ml/h (2160 ml per day)  Continue Pro-stat 30 ml BID  Provides 3440 kcal, 165 gm protein, 1646 ml free water daily  NUTRITION DIAGNOSIS:   Increased nutrient needs related to chronic illness, wound healing as evidenced by estimated needs, ongoing  GOAL:   Patient will meet greater than or equal to 90% of their needs, met  MONITOR:   TF tolerance, Vent status, Weight trends, Skin, I & O's  ASSESSMENT:   66 yo female with PMH of chronic hypoxic respiratory failure, trach & PEG, [place] encephalitis, breast CA, recently found to have R hip fx who was admitted on 11/11 with increased WOB related to PNA.   [facility] 11/24. Osmolite 1.5 currently infusing at goal rate of 90 ml/hr via PEG tube. Prostat 30 ml BID. CCM note reviewed. Currently off vent support. Has been on ATC x 24 hrs.  RD spoke with pt's caregiver, [name]. Reports pt has been having increased liquid stools. Colace order in place (100 mg daily). Per [name], plan is to change Colace frequency to PRN. Discussed with [name] nutrition care plan to keep pt on current formula at this time.   If loose stools continue, RD to trial semi-elemental formula (Vital 1.5). Free water flushes at 200 ml every 6 hours. Labs reviewed. CBG's [id number].  Diet Order:  Diet NPO time specified  EDUCATION NEEDS:   No education needs have been identified at this time  Skin:  Skin Assessment: Skin Integrity Issues: Skin Integrity Issues:: Stage II Stage II: R hip  Last BM:  11/27    Intake/Output Summary (Last 24 hours) at 12/21/2016 1555 Last data filed at 12/21/2016 1535 Gross per 24 hour  Intake 5300 ml  Output 1225 ml  Net 4075 ml   Height:   Ht Readings from Last 1 Encounters:  12/18/16 _0  (1.575 m)   Weight:   Wt Readings from Last 1 Encounters:  12/21/16 100 lb 8.5 oz (45.6 kg)    Ideal Body Weight:  50 kg  BMI:  Body mass index is 18.39 kg/m.  Estimated Nutritional Needs:   Kcal:  >2000  Protein:  >80 gm  Fluid:  >2 L  Arthur Holms, RD, LDN Pager #: [phone] After-Hours Pager #: [phone]

## 2016-12-21 NOTE — Plan of Care (Signed)
Pt is tolerating weaning to trach collar well.  She still has periodic tachypnia but is responding well with minimal suctioning.

## 2016-12-21 NOTE — Plan of Care (Signed)
Patient tolerating tube feeding via peg. Patient not experiencing no complications related to urinary retention. Will continue to monitor.

## 2016-12-22 ENCOUNTER — Ambulatory Visit: Payer: Medicare Other | Admitting: Pulmonary Disease

## 2016-12-22 LAB — GLUCOSE, CAPILLARY
GLUCOSE-CAPILLARY: 114 mg/dL — AB (ref 65–99)
GLUCOSE-CAPILLARY: 117 mg/dL — AB (ref 65–99)
GLUCOSE-CAPILLARY: 140 mg/dL — AB (ref 65–99)
Glucose-Capillary: 103 mg/dL — ABNORMAL HIGH (ref 65–99)

## 2016-12-22 NOTE — Progress Notes (Signed)
[facility]   Physician Requesting Consult:Peter Messick, M.D. 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 Known chronic hypoxic respiratory failure. Recently diagnosed with breast cancer having undergone lumpectomy summer 2018. Lives at home with family and does not at times as well as make small attempts to verbalize. Recently she has been found to have a broken right hip and is wheelchair-bound at baseline. Presents with 1 day history of increasing purulent sputum production and increased work of breathing. Her mental status has been slightly worse than usual. No subjective fever, chills, or sweats. No recent sick contacts that the husband reports. Patient has been more restless at home recently. Her saturations have dropped into the 70s at times today prompting her husband to bring her to the emergency room. Through imaging she was subsequently found to have bibasilar infiltrates suspicious for healthcare associated pneumonia and was started on broad-spectrum antiviral therapy.  Subjective: Remains off vent No secretions No desat   FiO2 (%):  [28 %] 28 %  Temp:  [98 F (36.7 C)-99.5 F (37.5 C)] 99.5 F (37.5 C) (11/28 1229) Pulse Rate:  [75-84] 84 (11/28 1229) Resp:  [18-46] 46 (11/28 1229) BP: (144-182)/(71-107) 161/96 (11/28 1229) SpO2:  [92 %-99 %] 97 % (11/28 1229) FiO2 (%):  [28 %] 28 % (11/28 1204) Weight:  [41.9 kg (92 lb 6 oz)-42.7 kg (94 lb 2.2 oz)] 42.7 kg (94 lb 2.2 oz) (11/28 0306)   General: awake Neuro: awake fc, no changes in chronic changes HEENT: trach clean, 6 PULM: cTA CV:  s1 s2 RRR not tachy GI: soft, BS wnl Extremities:  No edema, chronci changes     CBC Latest Ref Rng & Units 12/21/2016 12/19/2016 12/17/2016  WBC 4.0 - 10.5 K/uL 5.3 5.5 8.4  Hemoglobin 12.0 - 15.0 g/dL 12.1 10.9(L) 11.7(L)  Hematocrit 36.0 - 46.0 % 37.8  35.1(L) 37.1  Platelets 150 - 400 K/uL 318 312 262    BMP Latest Ref Rng & Units 12/20/2016 12/20/2016 12/19/2016  Glucose 65 - 99 mg/dL 92 129(H) 145(H)  BUN 6 - 20 mg/dL '17 20 10  '$ Creatinine 0.44 - 1.00 mg/dL <0.30(L) 0.31(L) 0.78  BUN/Creat Ratio 12 - 28 - - -  Sodium 135 - 145 mmol/L 140 141 138  Potassium 3.5 - 5.1 mmol/L 3.5 3.9 3.8  Chloride 101 - 111 mmol/L 103 103 107  CO2 22 - 32 mmol/L '29 30 25  '$ Calcium 8.9 - 10.3 mg/dL 8.8(L) 9.0 8.3(L)    Hepatic Function Latest Ref Rng & Units 12/20/2016 12/19/2016 12/17/2016  Total Protein 6.5 - 8.1 g/dL - - -  Albumin 3.5 - 5.0 g/dL 2.9(L) 2.2(L) 2.9(L)  AST 15 - 41 U/L - - -  ALT 14 - 54 U/L - - -  Alk Phosphatase 38 - 126 U/L - - -  Total Bilirubin 0.3 - 1.2 mg/dL - - -  Bilirubin, Direct 0.0 - 0.3 mg/dL - - -   IMAGING/STUDIES: PORT CXR 11/11: Previously reviewed by me. And reticular opacities predominantly in the lung bases with some areas of alveolar filling suggestive of a pneumonitis. PORT CXR 11/19: Previously reviewed by me. Questionable new right lower lung zone opacity. No pleural effusion appreciated. RUQ U/S 11/19:Unremarkable right upper quadrant ultrasound. CT CHEST W/O 11/21:  Previously reviewed by me. Focal dependent opacity posterior segment right lower lobe. Mucous plugging within left lower lobe noted. No pleural effusion. No  pneumothorax. PORT CXR 11/22:  Previously reviewed by me. Questionable progression of left basilar opacity. Low lung volumes today. No new focal opacity appreciated. Still retains good visualization of left hemidiaphragm. Tracheostomy in place.  MICROBIOLOGY: MRSA PCR 11/11:  Positive  Blood Cultures x2 11/11:  Negative  Tracheal Aspirate Culture 11/11:  MRSA Urine Streptococcal Antigen 11/11:  Negative Urine Legionella Antigen 11/11:  Negative  Urine Culture 11/18:  Negative  Blood Cultures x2 11/18 >>>neg Tracheal Aspirate Culture 11/19:  MRSA   ANTIBIOTICS: Merrem 11/11 -  11/13 Diflucan 11/18- 11/20 Nystatin Powder 11/20 >>> Vancomycin 11/11 - 11/17; 11/18 >>> (stop date 11/24) Tressie Ellis 11/18 >>> (stop date 11/24)  LINES/TUBES: Trach #6 (ENT) >>>  PEG >>> Foley >>>11/26 PIV  SIGNIFICANT EVENTS: 11/11 - Admit 11/12 - Worsening work of breathing & desat >> trach changed to cuffed #4 & placed on full vent support 11/16 - Transfer to vent SDU bed 11/17 - Transferred back to ICU w/ fever 11/21 - Trach up-sized by ENT to #6 cuffed  11/22 - Tolerated limited PS weaning 11/24 - To SDU  11/25 - ATC x 8-9 hours  11/26 - ATC 40%  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 Admitted with acute on chronic hypoxic respiratory failure secondary to pneumonia. Clinically improving. Tolerating limited pressure support weaning.   A Acute on chronic hypoxic respiratory failure requiring tracheostomy and mechanical ventilatory support.   Severe malnutrition  History of encephalitis - able to follow commands / communicate Urinary Retention Abnormal renal fxn that resolved, error? Mild loose stools  P: Remains off vent 48 hours, TC successful, no secretions Attempt PMV, if successful for daytime then would re cap No downsize  planned May need to change to vital , monitor stool output Was neg 760 cc allow bmet in am with stool output  I updated daughter and husband in room In AM likely move to floor with pulse ox  Lavon Paganini. Titus Mould, MD, [place] Pgr: [facility] 12/22/2016 1:50 PM

## 2016-12-22 NOTE — Progress Notes (Signed)
Speech-Language Pathology Note:  Orders received again for PMV evaluation. MD note states that pt is not a candidate for downsizing at this time. Discussed with MD - plan is to consider change to cuffless trach if she continues to remain off the vent. SLP will reattempt PMV if/when she can be changed to a cuffless #6 trach. However, if pt cannot tolerate the valve due to persistent signs of reduced upper airway patency, she may not be appropriate to use PMV and SLP may need to sign off for the time being. Will check back in at the end of the week to reassess.  Germain Osgood, M.A. CCC-SLP [phone]

## 2016-12-22 NOTE — Plan of Care (Signed)
Pt continues to tolerate ATC well with some tachypnia. Tolerating tube feeds with 1 episode of diarrhea. Family endorses holding colace and utilizing PRN if constipated. Husband has expressed desire to begin discharge planning.

## 2016-12-22 NOTE — Evaluation (Signed)
[facility] Speaking Valve - Evaluation Patient Details  Name: Judy Spears MRN: [id number] Date of Birth: 07-Apr-1950  Today's Date: 12/22/2016 Time: 0174-9449 SLP Time Calculation (min) (ACUTE ONLY): 23 min  Past Medical History:  Past Medical History:  Diagnosis Date  . Arthritis    lt great toe  . Complication of anesthesia    pt has had headaches post op-did advise to hydra well preop  . Hair loss    right-sided  . History of exercise stress test    a. ETT (12/15) with 12:00 exercise, no ST changes, occasional PACs.   . Hyperlipidemia   . Insomnia   . Migraine headache   . Movement disorder    resltess in left legs  . Postmenopausal   . 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 Premature atrial contractions    a. Holter (12/15) with 8% PACs, no atrial runs or atrial fibrillation.    Past Surgical History:  Past Surgical History:  Procedure Laterality Date  . BREAST BIOPSY  01/01/2011   Procedure: BREAST BIOPSY WITH NEEDLE LOCALIZATION;  Surgeon: Rolm Bookbinder, MD;  Location: [place];  Service: General;  Laterality: Left;  left breast wire localization biopsy  . BREAST BIOPSY Right 07/27/2012  . BREAST BIOPSY Right 07/07/2016  . BREAST BIOPSY Left 06/30/2016  . cataracts right eye    . CESAREAN SECTION  1986  . HERNIA REPAIR  2000   RIH  . IR [place] TUBE PERCUT W/FLUORO  09/29/2016  . opirectinal membrane peel    . PEG PLACEMENT    . RHINOPLASTY  1983  . TRACHEOSTOMY    . TRACHEOSTOMY TUBE PLACEMENT N/A 12/15/2016   Procedure: TRACHEOSTOMY CHANGE;  Surgeon: [name] Montane, MD;  Location: [facility] OR;  Service: ENT;  Laterality: N/A;   HPI:  Pt is a 66 yo female admitted with acute on chronic respiratory failure from HCAP. 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 Had initial improvement and transferred to vent SDU. Developed progressive fever, increase WOB, and rash around G tube site. Transferred back to ICU.  At baseline she has a #4 cuffless trach that she keeps capped. She has used PMV previously with this size trach with good tolerance. She required a #6 cuffed trach this admission due to leaking on the vent. She is well known to the SLP team, still being seen for OP SLP with goals targeting simple cognitive communication, AAC use, articulation/voicing, and dysphagia with exercises and ice chip trials.   Assessment / Plan / Recommendation Clinical Impression  Pt has no evidence of upper airway patency, showing signs of discomfort immediately upon valve placement and with a large burst of air noted upon removal after only a few respiratory cycles. Suspect that she is limited by the size of her trach, as she does not have audible congestion and there shoudldn't be significant concern for edema given that her trach was upsized a week ago. I think prognosis for return to PMV use will be good as her trach can be downsized, but until that time, would recommend use with SLP only.  SLP Visit Diagnosis: Aphonia (R49.1)    SLP Assessment  Patient needs continued Speech Lanaguage Pathology Services    Follow Up Recommendations  Home health SLP;24 hour supervision/assistance    Frequency and Duration min 2x/week  2 weeks    PMSV Trial PMSV was placed for: few breath cycles only Able to redirect subglottic air through upper airway: No Able to  Attain Phonation: No Able to Expectorate Secretions: No attempts Level of Secretion Expectoration with PMSV: Not observed Breath Support for Phonation: Inadequate Respirations During Trial: (fluctuates from teens to low 40s (family says uts baseline)) SpO2 During Trial: (90s) Pulse During Trial: [facility]) Behavior: Alert;Cooperative   Tracheostomy Tube       Vent Dependency  FiO2 (%): 28 %    Cuff Deflation Trial  GO Tolerated Cuff Deflation: Yes Length of Time for Cuff Deflation Trial: 20+ min Behavior: Alert;Cooperative        Germain Osgood 12/22/2016, 10:25 AM   Germain Osgood, M.A. CCC-SLP [phone]

## 2016-12-22 NOTE — Care Management Note (Addendum)
Case Management Note  Patient Details  Name: Judy Spears MRN: [id number] Date of Birth: [birth year]  Subjective/Objective:    From home with spouse,  Has chronic trach dependence ( 2 years), chronic hypoxic resp failure total care. Recently diagnosed with breast cancer having undergone lumpectomy summer 2018.  Recently she has been found to have a broken right hip and is wheelchair-bound at baseline. Presents with increasing purulent sputum production and increased work of breathing. Her mental status has been slightly worse than usual.  Her saturations have dropped into the 70s at times which prompted her husband to bring her to the emergency room. Through imaging she was subsequently found to have bibasilar infiltrates suspicious for healthcare associated pneumonia and was started on broad-spectrum antiviral therapy.  She has caregivers around the clock and she has all the DME she needs at home except for oygen if she needs that at discharge, not sure yet if she will need home oxygen , she did not have oxygen previously at home.   11/29 [place], BSN - NCM received information from CSW this am that MD states spouse would like referral sent to the [facility] at [facility], NCM contacted spouse and asked if it was ok for information to be sent to [facility],he said yes.  NCM faxed information to [facility], tried around 5 times to the fax number [phone].  NCM called back to let them know fax is not going thru, [name] stated that someone was working on that fax machine so it is not working and to fax the info to [phone].  It did go thru on that fax number.  NCM  Called spouse back and asked if this is not approved would he be ok if we added additional [place] services for her when she goes home with [facility]) which would help when patient have problems at home the [facility] could contact MD and receive orders over the phone to assist patient, he states that would be great  , He asked who would cover it, NCM informed him Medicare would cover.  Spouse then also stated since patient has been in [facility] before , he would also like to try for her to go back there again if she can, so we could have both rehabs looking at patient.  He states this will give her time to get better.  NCM contacted MD and he says yes to go ahead with that process. NCM also contacted Butch Penny with [facility] informed her about the [facility] discussion,  They will follow along to see if patient will go home from hospital or to [place].  12/3 1157 Tomi Bamberger RN, BSN - NCM spoke with Tammy at the [facility] informed her of the Mucus Plug and that patient was on vent Fri,Sat, Sun,today weaning on trach collar at 57.  Tammy also states that she will have to have a uncuffed trach to come to [facility], would like to watch patient a couple days, will send updates on Wed.        Action/Plan:    Referral sent to Both [facility] and [facility].- will need to let Dr. Titus Mould know outcome.  Contact at the [facility] is [place] [address] [phone].    Expected Discharge Date:                  Expected Discharge Plan:  [place]  In-House Referral:     Discharge planning Services  CM Consult  Post Acute Care Choice:    Choice offered to:     DME Arranged:    DME Agency:     HH Arranged:    HH Agency:     Status of Service:  In process, will continue to follow  If discussed at Long Length of Stay Meetings, dates discussed:    Additional Comments:  Zenon Mayo, RN 12/22/2016, 12:43 PM

## 2016-12-23 DIAGNOSIS — R5381 Other malaise: Secondary | ICD-10-CM

## 2016-12-23 LAB — BASIC METABOLIC PANEL
Anion gap: 10 (ref 5–15)
BUN: 19 mg/dL (ref 6–20)
CALCIUM: 9.1 mg/dL (ref 8.9–10.3)
CO2: 28 mmol/L (ref 22–32)
CREATININE: 0.3 mg/dL — AB (ref 0.44–1.00)
Chloride: 101 mmol/L (ref 101–111)
GFR calc Af Amer: >60 mL/min (ref >60)
Glucose, Bld: 138 mg/dL — ABNORMAL HIGH (ref 65–99)
Potassium: 3.7 mmol/L (ref 3.5–5.1)
Sodium: 139 mmol/L (ref 135–145)

## 2016-12-23 LAB — GLUCOSE, CAPILLARY
Glucose-Capillary: 111 mg/dL — ABNORMAL HIGH (ref 65–99)
Glucose-Capillary: 142 mg/dL — ABNORMAL HIGH (ref 65–99)

## 2016-12-23 MED ORDER — ALBUTEROL SULFATE (2.5 MG/3ML) 0.083% IN NEBU
2.5000 mg | INHALATION_SOLUTION | Freq: Four times a day (QID) | RESPIRATORY_TRACT | Status: DC | PRN
  Administered 2016-12-24 (×2): 2.5 mg via RESPIRATORY_TRACT
  Filled 2016-12-23 (×2): qty 3

## 2016-12-23 NOTE — Progress Notes (Signed)
Patient transferred to [facility]. Report called to receiving nurse. All questions answered. Husband at bedside and notified of room number. All belongings transferred with patient.

## 2016-12-23 NOTE — Progress Notes (Signed)
[facility]   Physician Requesting Consult:Peter Messick, M.D. 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 Known chronic hypoxic respiratory failure. Recently diagnosed with breast cancer having undergone lumpectomy summer 2018. Lives at home with family and does not at times as well as make small attempts to verbalize. Recently she has been found to have a broken right hip and is wheelchair-bound at baseline. Presents with 1 day history of increasing purulent sputum production and increased work of breathing. Her mental status has been slightly worse than usual. No subjective fever, chills, or sweats. No recent sick contacts that the husband reports. Patient has been more restless at home recently. Her saturations have dropped into the 70s at times today prompting her husband to bring her to the emergency room. Through imaging she was subsequently found to have bibasilar infiltrates suspicious for healthcare associated pneumonia and was started on broad-spectrum antiviral therapy.  Subjective: Remains off vent Unable to PMV with 6 Stools 1-2 better    FiO2 (%):  [28 %] 28 %  Temp:  [98 F (36.7 C)-99.8 F (37.7 C)] 99.8 F (37.7 C) (11/29 0726) Pulse Rate:  [73-84] 83 (11/29 0726) Resp:  [17-55] 19 (11/29 0726) BP: (123-164)/(63-96) 161/94 (11/29 0726) SpO2:  [95 %-99 %] 98 % (11/29 0726) FiO2 (%):  [28 %] 28 % (11/29 0339) Weight:  [46 kg (101 lb 6.6 oz)] 46 kg (101 lb 6.6 oz) (11/29 0433)   General: awake, min interaction Neuro: perrl, moves ext HEENT: jvd wnl PULM: CTa CV: s1 s2 RRR distant slight GI: soft, Bs wnl, no r Extremities:  No edema      CBC Latest Ref Rng & Units 12/21/2016 12/19/2016 12/17/2016  WBC 4.0 - 10.5 K/uL 5.3 5.5 8.4  Hemoglobin 12.0 - 15.0 g/dL 12.1 10.9(L) 11.7(L)  Hematocrit 36.0 - 46.0 % 37.8 35.1(L) 37.1  Platelets  150 - 400 K/uL 318 312 262    BMP Latest Ref Rng & Units 12/20/2016 12/20/2016 12/19/2016  Glucose 65 - 99 mg/dL 92 129(H) 145(H)  BUN 6 - 20 mg/dL _0 Creatinine 0.44 - 1.00 mg/dL <0.30(L) 0.31(L) 0.78  BUN/Creat Ratio 12 - 28 - - -  Sodium 135 - 145 mmol/L 140 141 138  Potassium 3.5 - 5.1 mmol/L 3.5 3.9 3.8  Chloride 101 - 111 mmol/L 103 103 107  CO2 22 - 32 mmol/L _1 Calcium 8.9 - 10.3 mg/dL 8.8(L) 9.0 8.3(L)    Hepatic Function Latest Ref Rng & Units 12/20/2016 12/19/2016 12/17/2016  Total Protein 6.5 - 8.1 g/dL - - -  Albumin 3.5 - 5.0 g/dL 2.9(L) 2.2(L) 2.9(L)  AST 15 - 41 U/L - - -  ALT 14 - 54 U/L - - -  Alk Phosphatase 38 - 126 U/L - - -  Total Bilirubin 0.3 - 1.2 mg/dL - - -  Bilirubin, Direct 0.0 - 0.3 mg/dL - - -   IMAGING/STUDIES: PORT CXR 11/11: Previously reviewed by me. And reticular opacities predominantly in the lung bases with some areas of alveolar filling suggestive of a pneumonitis. PORT CXR 11/19: Previously reviewed by me. Questionable new right lower lung zone opacity. No pleural effusion appreciated. RUQ U/S 11/19:Unremarkable right upper quadrant ultrasound. CT CHEST W/O 11/21:  Previously reviewed by me. Focal dependent opacity posterior segment right lower lobe. Mucous plugging within left lower lobe noted. No pleural effusion. No pneumothorax. PORT CXR  11/22:  Previously reviewed by me. Questionable progression of left basilar opacity. Low lung volumes today. No new focal opacity appreciated. Still retains good visualization of left hemidiaphragm. Tracheostomy in place.  MICROBIOLOGY: MRSA PCR 11/11:  Positive  Blood Cultures x2 11/11:  Negative  Tracheal Aspirate Culture 11/11:  MRSA Urine Streptococcal Antigen 11/11:  Negative Urine Legionella Antigen 11/11:  Negative  Urine Culture 11/18:  Negative  Blood Cultures x2 11/18 >>>neg Tracheal Aspirate Culture 11/19:  MRSA   ANTIBIOTICS: Merrem 11/11 - 11/13 Diflucan 11/18-  11/20 Nystatin Powder 11/20 >>> Vancomycin 11/11 - 11/17; 11/18 >>> (stop date 11/24) Tressie Ellis 11/18 >>> (stop date 11/24)  LINES/TUBES: Trach #6 (ENT) >>>  PEG >>> Foley >>>11/26 PIV  SIGNIFICANT EVENTS: 11/11 - Admit 11/12 - Worsening work of breathing & desat >> trach changed to cuffed #4 & placed on full vent support 11/16 - Transfer to vent SDU bed 11/17 - Transferred back to ICU w/ fever 11/21 - Trach up-sized by ENT to #6 cuffed  11/22 - Tolerated limited PS weaning 11/24 - To SDU  11/25 - ATC x 8-9 hours  11/26 - ATC 40%  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 Admitted with acute on chronic hypoxic respiratory failure secondary to pneumonia. Clinically improving. Tolerating limited pressure support weaning.   A Acute on chronic hypoxic respiratory failure requiring tracheostomy and mechanical ventilatory support.   Severe malnutrition  History of encephalitis - able to follow commands / communicate Urinary Retention Abnormal renal fxn that resolved, error? Mild loose stools  P: Keep off vent further unable to pass air above 6 trach and we should not downsize to a 4 at this time No PMV or cap O2 support as needed for sats goals  Foley remains out, avoid placement Stools have improved on own with current TF Family has requested [facility], we are calling and having social work to [employer] PT consult help for rehab needs Lytes wnl, acoid phlebotomy, no labs needed in am  We discussed recent work up with DR Shaaron Adler at [facility], for her neuro declines, family would not want neuro assessment here at [facility], this can be continued as outpt or at [place] center (MRI and LP was recommended in future)  Lavon Paganini. Titus Mould, MD, [place] Pgr: [facility] 12/23/2016 8:58 AM

## 2016-12-23 NOTE — Progress Notes (Signed)
Nutrition Follow Up/Brief Note  RD spoke with pt's caregiver, [name] at bedside.  [name] reports pt is having less frequent stools after discontinuation of Colace.  Stated "she went from having five (5) to six (6) per day to two (2)".  [name] amenable to continuing current TF regimen.  RD to continue to follow.  Arthur Holms, RD, LDN Pager #: [phone] After-Hours Pager #: [phone]

## 2016-12-23 NOTE — Progress Notes (Signed)
Physical Therapy Treatment Patient Details Name: Judy Spears MRN: [id number] DOB: 1950-08-02 Today's Date: 12/23/2016    History of Present Illness 66 yo female with acute on chronic respiratory failure from HCAP. 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 Had initial improvement and transferred to vent SDU. Developed progressive fever, increase WOB, and rash around G tube site. Transferred back to ICU.    PT Comments    Patient tolerated sitting EOB x 10 minutes with +2 total assist for balance.  Patient with shaking of LE's and body during session.  Initially during sitting, patient with LE and trunk extension.  Slow movements used to move patient into more flexion at hips/knees.  PT using shin-to-shin to increase bil knee flexion.  Patient with slow progress.  Agree with f/u HHPT at d/c for continued therapy.  Patient has 24/7 care at home.    Follow Up Recommendations  Home health PT;Supervision/Assistance - 24 hour     Equipment Recommendations  Other (comment)(Lift equipment)    Recommendations for Other Services       Precautions / Restrictions Precautions Precautions: Fall Restrictions Weight Bearing Restrictions: No    Mobility  Bed Mobility Overal bed mobility: Needs Assistance Bed Mobility: Supine to Sit;Sit to Supine     Supine to sit: Total assist;+2 for physical assistance Sit to supine: Total assist;+2 for physical assistance   General bed mobility comments: +2 total assist for all aspects of bed mobility.  In sitting, requiring +2 total-max assist to maintain sitting balance.  Patient with significant posterior and Lt, with trunk pulling into later Lt curve.  Positioned patient onto Rt elbow to shift weight more Rt/forward.  Continued to require max assist for balance.  Tolerated EOB x 10 minutes.  Transfers                 General transfer comment: NT.  Patient with BLE, trunk and UE shaking during  session.  Felt unsafe to attempt pivot transfer today.  Recommend lift equipment to chair.  Ambulation/Gait                 Stairs            Wheelchair Mobility    Modified Rankin (Stroke Patients Only)       Balance Overall balance assessment: Needs assistance Sitting-balance support: Feet supported;No upper extremity supported Sitting balance-Leahy Scale: Zero Sitting balance - Comments: total A to maintain upright with therapist blocking knees.  When pt sits EOB, pt will kick LEs into extension impulsively and needs cues to "calm down" per caregiver.  Pt does relax with cues.  Pt also sits with left side flexion but did work with pt on leaning onto right elbow and pt was able to sit a little more upright after working on that but still needs constant external support.  Postural control: Posterior lean;Left lateral lean                                  Cognition Arousal/Alertness: Awake/alert Behavior During Therapy: Restless Overall Cognitive Status: Difficult to assess                                        Exercises Other Exercises Other Exercises: At EOB, PT with shin to shin pressure, attempting to decrease  LE shaking, and working on passive knee flexion.    General Comments        Pertinent Vitals/Pain Pain Assessment: Faces Faces Pain Scale: No hurt    Home Living                      Prior Function            PT Goals (current goals can now be found in the care plan section) Acute Rehab PT Goals Patient Stated Goal: to be able to pivot transfer from bed to wheel chair Progress towards PT goals: Progressing toward goals    Frequency    Min 2X/week      PT Plan Current plan remains appropriate    Co-evaluation              AM-PAC PT "6 Clicks" Daily Activity  Outcome Measure  Difficulty turning over in bed (including adjusting bedclothes, sheets and blankets)?: Unable Difficulty  moving from lying on back to sitting on the side of the bed? : Unable Difficulty sitting down on and standing up from a chair with arms (e.g., wheelchair, bedside commode, etc,.)?: Unable Help needed moving to and from a bed to chair (including a wheelchair)?: Total Help needed walking in hospital room?: Total Help needed climbing 3-5 steps with a railing? : Total 6 Click Score: 6    End of Session   Activity Tolerance: Patient limited by fatigue Patient left: in bed;with call bell/phone within reach;with family/visitor present Nurse Communication: Mobility status;Need for lift equipment PT Visit Diagnosis: Other abnormalities of gait and mobility (R26.89);Muscle weakness (generalized) (M62.81)     Time: 9563-8756 PT Time Calculation (min) (ACUTE ONLY): 27 min  Charges:  $Therapeutic Activity: 23-37 mins                    G Codes:       [name]. [name], [facility] Acute Rehab Services Pager [place] 12/23/2016, 7:49 PM

## 2016-12-23 NOTE — Consult Note (Signed)
Physical Medicine and Rehabilitation Consult   Reason for Consult: Debility  Referring Physician: Dr. Titus Mould.    HPI: Judy Spears is a 66 y.o. female with history of [place] encephalitis, breast cancer with lumpectomy,  chronic hypoxic respiratory failure--trach dependent, recent diagnosis of indeterminate right femoral neck fracture 12/02/16 who was admitted on 12/05/16 with hypoxia suspicious for HCAP. She was treated with broad spectrum antibiotics but  Required full vent support 12/06/16 and trach up-sized to cuffed # 6  by Dr. Janace Hoard on 11/19.  She was weaned to ATC and no plans to cap or PMSV as unable to pass air above size 6 trach per pulmonary.  Foley out and diarrhea resolving off stool softners. Therapy evaluations pending. CIR recommended due to debility as well as recent decline due to femur fracture.   Patient well known to CIR from prior stays and has been undergoing outpatient PT. She was able to transfer with mod to max assist with therapy.  Was working on trialing [employer]?   Review of Systems  Unable to perform ROS: Patient nonverbal      Past Medical History:  Diagnosis Date  . Arthritis    lt great toe  . Complication of anesthesia    pt has had headaches post op-did advise to hydra well preop  . Hair loss    right-sided  . History of exercise stress test    a. ETT (12/15) with 12:00 exercise, no ST changes, occasional PACs.   . Hyperlipidemia   . Insomnia   . Migraine headache   . Movement disorder    resltess in left legs  . Postmenopausal   . 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 Premature atrial contractions    a. Holter (12/15) with 8% PACs, no atrial runs or atrial fibrillation.     Past Surgical History:  Procedure Laterality Date  . BREAST BIOPSY  01/01/2011   Procedure: BREAST BIOPSY WITH NEEDLE LOCALIZATION;  Surgeon: Rolm Bookbinder, MD;  Location: [facility];  Service: General;  Laterality: Left;  left breast  wire localization biopsy  . BREAST BIOPSY Right 07/27/2012  . BREAST BIOPSY Right 07/07/2016  . BREAST BIOPSY Left 06/30/2016  . cataracts right eye    . CESAREAN SECTION  1986  . HERNIA REPAIR  2000   [facility]  . IR Brooks TUBE PERCUT W/FLUORO  09/29/2016  . opirectinal membrane peel    . PEG PLACEMENT    . RHINOPLASTY  1983  . TRACHEOSTOMY    . TRACHEOSTOMY TUBE PLACEMENT N/A 12/15/2016   Procedure: TRACHEOSTOMY CHANGE;  Surgeon: [name] Montane, MD;  Location: [facility] OR;  Service: ENT;  Laterality: N/A;    Family History  Problem Relation Age of Onset  . Stroke Mother        paralyzed on right side/aphasia  . Hypertension Mother        had stroke after stopping BP meds  . Colon cancer Father   . Prostate cancer Father   . Heart attack Maternal Grandfather 56  . Stroke Maternal Uncle 101    Social History:  reports that  has never smoked. she has never used smokeless tobacco. She reports that she drinks alcohol. She reports that she does not use drugs.    Allergies  Allergen Reactions  . Cefepime Other (See Comments)    Status epilepticus- this reaction is questionable per husband.States that it was determined that pt did not have seizure activity. Status epilepticus  .  Tetracyclines & Related Photosensitivity and Other (See Comments)    Blisters (childhood allergy)  . Doxycycline Monohydrate Other (See Comments)    Blisters, photosensitivity- family state that the reaction is actually to tetracycline.  . Pollen Extract Other (See Comments)    Running nose (seasonal allergies)   Medications Prior to Admission  Medication Sig Dispense Refill  . albuterol (PROVENTIL) (2.5 MG/3ML) 0.083% nebulizer solution Take 3 mLs (2.5 mg total) by nebulization every 6 (six) hours as needed for wheezing or shortness of breath. (Patient taking differently: Take 2.5 mg every 6 (six) hours as needed by nebulization for wheezing or shortness of breath (congestion). ) 360 mL 11  . amantadine  (SYMMETREL) 50 MG/5ML solution PLACE 42mL INTO FEEDING TUBE TWICE DAILY (Patient taking differently: PLACE 10 ML (100 MG)  INTO FEEDING TUBE TWICE DAILY -   8AM AND NOON) 900 mL 0  . Amino Acids-Protein Hydrolys (FEEDING SUPPLEMENT, PRO-STAT SUGAR FREE 64,) LIQD Place 30 mLs into feeding tube 2 (two) times daily. 1800 mL 0  . amLODipine (NORVASC) 10 MG tablet Place 1 tablet (10 mg total) into feeding tube daily. 30 tablet 11  . atorvastatin (LIPITOR) 20 MG tablet Place 20 mg into feeding tube at bedtime.     . baclofen (LIORESAL) 10 MG tablet PLACE 1 TABLET INTO FEEDING TUBE EVERY 6HOURS (Patient taking differently: PLACE 1 TABLET (10 MG) INTO FEEDING TUBE FOUR TIMES DAILY) 120 each 0  . bethanechol (URECHOLINE) 25 MG tablet Place 1 tablet (25 mg total) into feeding tube 2 (two) times daily. FOR 2 WEEKS, THEN DECREASE TO DAILY FOR 2 WEEKS THEN STOP (Patient taking differently: Place 25 mg See admin instructions into feeding tube. Start date 11/25/16: give 1 tablet (25 mg) per tube twice daily for 2 weeks, then decrease to once daily for 2 weeks, then stop) 42 tablet 0  . chlorhexidine (PERIDEX) 0.12 % solution 28ml IN THE MOUTH OR THROAT TWICE DAILY (Patient taking differently: SWAB MOUTH/THROAT WITH 5 ML TWICE DAILY) 473 mL 0  . Cholecalciferol (VITAMIN D) 2000 units CAPS Place 2,000 Units daily into feeding tube.    . clotrimazole (LOTRIMIN) 1 % cream Apply topically 2 (two) times daily. (Patient taking differently: Apply 1 application 2 (two) times daily as needed topically (rash). ) 113 g 0  . Coenzyme Q10 (COQ10 PO) Place 10 mLs at bedtime into feeding tube.    [name] CRANBERRY JUICE EXTRACT PO Place 60 mLs daily into feeding tube.    . docusate sodium (COLACE) 100 MG capsule 100 mg See admin instructions. Give 1 capsule (100 mg) per tube twice daily    . fluticasone (FLONASE) 50 MCG/ACT nasal spray Place 2 sprays 2 (two) times daily into both nostrils.    [name] gabapentin (NEURONTIN) 100 MG capsule One  capsule--open and administer via PEG twice a day. (Patient taking differently: 100 mg See admin instructions. Open one capsule (100 mg) and administer per tube twice daily - 8 am and 5pm) 60 capsule 0  . guaiFENesin (ROBITUSSIN) 100 MG/5ML SOLN Place 10 mLs (200 mg total) into feeding tube 4 (four) times daily. (Patient taking differently: Place 10 mLs See admin instructions into feeding tube. Give 10 mls (200 mg) per tube two to four times daily for congestion) 1200 mL 0  . loratadine (CLARITIN) 10 MG tablet Place 1 tablet (10 mg total) into feeding tube daily as needed for allergies. (Patient taking differently: Place 10 mg daily into feeding tube. daily)    . Melatonin  5 MG CAPS Place 5 mg at bedtime into feeding tube.     . Multiple Vitamin (MULTIVITAMIN) LIQD Place 15 mLs into feeding tube daily. 1 Bottle 3  . Nutritional Supplements (FEEDING SUPPLEMENT, OSMOLITE 1.5 CAL,) LIQD Place 1,000 mLs into feeding tube daily. (Patient taking differently: Place See admin instructions into feeding tube. 85 ml/hour for 20 hours per day)  0  . potassium chloride 20 MEQ/15ML (10%) SOLN Place 15 mLs (20 mEq total) into feeding tube daily. 900 mL 5  . primidone (MYSOLINE) 50 MG tablet Place 1 tablet (50 mg total) into feeding tube 4 (four) times daily. 120 tablet 1  . Probiotic Product (PROBIOTIC PO) Place 1 capsule 2 (two) times daily into feeding tube.    . sodium chloride HYPERTONIC 3 % nebulizer solution Take by nebulization 2 (two) times daily as needed for other. (Patient taking differently: Take 4 mLs 2 (two) times daily as needed by nebulization (to loosen up chest). ) 360 mL 11  . valACYclovir (VALTREX) 500 MG tablet Place 500 mg daily into feeding tube.     . vitamin C (ASCORBIC ACID) 500 MG tablet Place 500 mg daily into feeding tube.    . Water For Irrigation, Sterile (FREE WATER) SOLN Place 200 mLs into feeding tube every 8 (eight) hours. (Patient taking differently: Place 200 mLs 3 (three) times  daily into feeding tube. )    . saccharomyces boulardii (FLORASTOR) 250 MG capsule Place 1 capsule (250 mg total) into feeding tube 2 (two) times daily. (Patient not taking: Reported on 12/05/2016) 60 capsule 0    Home: Home Living Family/patient expects to be discharged to:: Private residence Living Arrangements: Spouse/significant other Available Help at Discharge: Family, Personal care attendant, Available 24 hours/day Type of Home: House Home Access: Ramped entrance Home Layout: Two level, Bed/bath upstairs Alternate Level Stairs-Number of Steps: chair lift Alternate Level Stairs-Rails: (chair lift to second floor) Bathroom Shower/Tub: Walk-in shower(Has roll in shower chair) Bathroom Toilet: Handicapped height Bathroom Accessibility: Yes Home Equipment: Hospital bed, Wheelchair - manual, Tub bench, Hand held shower head, Bedside commode, Walker - 2 wheels(shower W/C, Charlaine Dalton) Additional Comments: Pt's husband provided all info about home   Functional History: Prior Function Level of Independence: Needs assistance Gait / Transfers Assistance Needed: Had been using stedy the last couple of months to get to [facility]. Is WC (tilt in space) dependent at baseline ADL's / Homemaking Assistance Needed: max assist with bathing, dressing Functional Status:  Mobility: Bed Mobility Overal bed mobility: Needs Assistance Bed Mobility: Rolling, Supine to Sit, Sit to Supine Rolling: Total assist, +2 for physical assistance Supine to sit: Total assist, +2 for physical assistance Sit to supine: Total assist, +2 for physical assistance General bed mobility comments: total A x2 for all aspects Transfers Overall transfer level: Needs assistance Equipment used: 2 person hand held assist(Lateral draw sheet transfer) Transfers: Sit to/from Stand, Squat Pivot Transfers Sit to Stand: +2 safety/equipment, +2 physical assistance, Total assist Stand pivot transfers: +2 safety/equipment, +2 physical assistance,  Total assist Squat pivot transfers: Total assist, +2 physical assistance, +2 safety/equipment  Lateral/Scoot Transfers: +2 safety/equipment, +2 physical assistance, Total assist General transfer comment: Pt was able to perform squat pivot with little weight on bil LEs for transfer with use of pad.  Pt has difficulty with trunk stability.   Ambulation/Gait General Gait Details: non ambulatory    ADL:    Cognition: Cognition Overall Cognitive Status: Difficult to assess Orientation Level: Intubated/Tracheostomy - Unable to assess Cognition  Arousal/Alertness: Awake/alert Behavior During Therapy: Restless Overall Cognitive Status: Difficult to assess Difficult to assess due to: Impaired communication, Tracheostomy  Blood pressure (!) 112/93, pulse 83, temperature 98.4 F (36.9 C), temperature source Oral, resp. rate (!) 38, height 5\' 2"  (1.575 m), weight 46 kg (101 lb 6.6 oz), last menstrual period 12/01/2010, SpO2 98 %. Physical Exam  Nursing note and vitals reviewed. Constitutional: She appears lethargic. She appears cachectic. She has a sickly appearance.  Slumped to the left in bed  HENT:  Head: Normocephalic and atraumatic.  Mouth/Throat: Oropharynx is clear and moist.  Eyes: Conjunctivae and EOM are normal. Pupils are equal, round, and reactive to light.  Neck:  Cuffed #6 trach in place with ATC  Cardiovascular: Normal rate and regular rhythm.  Respiratory: Effort normal. No stridor. She exhibits no tenderness.  GI: Soft. Bowel sounds are normal. She exhibits no distension. There is no tenderness.  PEG site clean and dry  Musculoskeletal: She exhibits no edema.  Neurological: She appears lethargic.  Made eye contact and smiled. Contracture LUE. Dystonic movements BLE.  Skin: Skin is warm and dry.  Psychiatric: Her affect is blunt. She is slowed.    Results for orders placed or performed during the hospital encounter of 12/05/16 (from the past 24 hour(s))  Glucose,  capillary     Status: Abnormal   Collection Time: 12/23/16 12:03 AM  Result Value Ref Range   Glucose-Capillary 111 (H) 65 - 99 mg/dL   Comment 1 Notify RN    Comment 2 Document in Chart   Basic metabolic panel     Status: Abnormal   Collection Time: 12/23/16  8:32 AM  Result Value Ref Range   Sodium 139 135 - 145 mmol/L   Potassium 3.7 3.5 - 5.1 mmol/L   Chloride 101 101 - 111 mmol/L   CO2 28 22 - 32 mmol/L   Glucose, Bld 138 (H) 65 - 99 mg/dL   BUN 19 6 - 20 mg/dL   Creatinine, Ser 0.30 (L) 0.44 - 1.00 mg/dL   Calcium 9.1 8.9 - 10.3 mg/dL   GFR calc non Af Amer >60 >60 mL/min   GFR calc Af Amer >60 >60 mL/min   Anion gap 10 5 - 15   *Note: Due to a large number of results and/or encounters for the requested time period, some results have not been displayed. A complete set of results can be found in Results Review.   No results found.  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 Subacute/chronic right femoral neck fracture.   Admitted to the hospital this month for respiratory failure 1. Does the need for close, 24 hr/day medical supervision in concert with the patient's rehab needs make it unreasonable for this patient to be served in a less intensive setting? No 2. Co-Morbidities requiring supervision/potential complications:   3. Due to bladder management, bowel management, safety, skin/wound care and medication administration, does the patient require 24 hr/day rehab nursing? No 4. Does the patient require coordinated care of a physician, rehab nurse, PT (1-2 hrs/day, 5 days/week), OT (1-2 hrs/day, 5 days/week) and SLP (1-2 hrs/day, 5 days/week) to address physical and functional deficits in the context of the above medical diagnosis(es)? No Addressing deficits in the following areas: balance, endurance, locomotion, strength, transferring, bowel/bladder control, bathing,  dressing, feeding, grooming, toileting, cognition, swallowing and psychosocial support 5. Can the patient actively participate in an intensive therapy program of at least 3  hrs of therapy per day at least 5 days per week? No 6. The potential for patient to make measurable gains while on inpatient rehab is poor 7. Anticipated functional outcomes upon discharge from inpatient rehab are n/a  with PT, n/a with OT, n/a with SLP. 8. Estimated rehab length of stay to reach the above functional goals is: n/a 9. Anticipated D/C setting: Home 10. Anticipated post D/C treatments: [place] therapy 11. Overall Rehab/Functional Prognosis: fair  RECOMMENDATIONS: This patient's condition is appropriate for continued rehabilitative care in the following setting: SNF or LTACH Patient has agreed to participate in recommended program. N/A Note that insurance prior authorization may be required for reimbursement for recommended care.  Comment: I spoke at length with patient's caregiver [name] whom I know well.  Given the issues with her right femur fracture and the ongoing respiratory issues , she is unable to participate in the intensity of an inpatient rehab program.  She was with [place] earlier in the Fall but quite frankly, she struggled to participate in our program.  I think a slower approach would be more beneficial for Mrs. Golomb.  Given the ongoing trach and respiratory complications an LTACH might be the best option for her.  If she is medically stable then perhaps SNF would be an option as well.   Meredith Staggers, MD, [facility] 12/24/2016    Bary Leriche, Hershal Coria 12/23/2016

## 2016-12-24 ENCOUNTER — Inpatient Hospital Stay (HOSPITAL_COMMUNITY): Payer: Medicare Other

## 2016-12-24 LAB — GLUCOSE, CAPILLARY
GLUCOSE-CAPILLARY: 131 mg/dL — AB (ref 65–99)
GLUCOSE-CAPILLARY: 129 mg/dL — AB (ref 65–99)
GLUCOSE-CAPILLARY: 137 mg/dL — AB (ref 65–99)
Glucose-Capillary: 107 mg/dL — ABNORMAL HIGH (ref 65–99)
Glucose-Capillary: 118 mg/dL — ABNORMAL HIGH (ref 65–99)
Glucose-Capillary: 128 mg/dL — ABNORMAL HIGH (ref 65–99)
Glucose-Capillary: 130 mg/dL — ABNORMAL HIGH (ref 65–99)

## 2016-12-24 MED ORDER — MIDAZOLAM HCL 2 MG/2ML IJ SOLN
1.0000 mg | INTRAMUSCULAR | Status: DC | PRN
Start: 2016-12-24 — End: 2016-12-27

## 2016-12-24 MED ORDER — FENTANYL CITRATE (PF) 100 MCG/2ML IJ SOLN
50.0000 ug | INTRAMUSCULAR | Status: DC | PRN
  Administered 2016-12-24 – 2017-01-02 (×3): 50 ug via INTRAVENOUS
  Filled 2016-12-24 (×3): qty 2

## 2016-12-24 NOTE — Progress Notes (Signed)
[facility]   Physician Requesting Consult:Peter Messick, M.D. 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 Known chronic hypoxic respiratory failure. Recently diagnosed with breast cancer having undergone lumpectomy summer 2018. Lives at home with family and does not at times as well as make small attempts to verbalize. Recently she has been found to have a broken right hip and is wheelchair-bound at baseline. Presents with 1 day history of increasing purulent sputum production and increased work of breathing. Her mental status has been slightly worse than usual. No subjective fever, chills, or sweats. No recent sick contacts that the husband reports. Patient has been more restless at home recently. Her saturations have dropped into the 70s at times today prompting her husband to bring her to the emergency room. Through imaging she was subsequently found to have bibasilar infiltrates suspicious for healthcare associated pneumonia and was started on broad-spectrum antiviral therapy.  Subjective: Currently on pressure support due to mucous plugs during the night.   Vent Mode: PSV;CPAP FiO2 (%):  [5 %-98 %] 30 % Set Rate:  [12 bmp] 12 bmp Vt Set:  [400 mL] 400 mL PEEP:  [5 cmH20] 5 cmH20 Pressure Support:  [12 cmH20] 12 cmH20 Plateau Pressure:  [17 cmH20] 17 cmH20  Temp:  [98.4 F (36.9 C)-99.6 F (37.6 C)] 99.4 F (37.4 C) (11/30 0739) Pulse Rate:  [75-88] 88 (11/30 0838) Resp:  [20-38] 32 (11/30 0838) BP: (110-167)/(70-105) 144/90 (11/30 0838) SpO2:  [84 %-100 %] 92 % (11/30 0900) FiO2 (%):  [5 %-98 %] 30 % (11/30 0838) Weight:  [96 lb 12.5 oz (43.9 kg)-105 lb 9.6 oz (47.9 kg)] 105 lb 9.6 oz (47.9 kg) (11/30 0500)   General: Frail ill-appearing cachectic white female attached to mechanical ventilatory support HEENT: #6 trach connected to vent PSY: Difficult to  assess Neuro: Follows some commands has jerky rhythmic movements CV: Heart sounds are regular PULM: Coarse rhonchi bilaterally decreased breath sounds in left base LP:NPYY, non-tender, bsx4 active PEG in place Extremities: warm/dry, good of edema, extensive muscle wasting Skin: no rashes or lesions       CBC Latest Ref Rng & Units 12/21/2016 12/19/2016 12/17/2016  WBC 4.0 - 10.5 K/uL 5.3 5.5 8.4  Hemoglobin 12.0 - 15.0 g/dL 12.1 10.9(L) 11.7(L)  Hematocrit 36.0 - 46.0 % 37.8 35.1(L) 37.1  Platelets 150 - 400 K/uL 318 312 262    BMP Latest Ref Rng & Units 12/23/2016 12/20/2016 12/20/2016  Glucose 65 - 99 mg/dL 138(H) 92 129(H)  BUN 6 - 20 mg/dL _0 Creatinine 0.44 - 1.00 mg/dL 0.30(L) <0.30(L) 0.31(L)  BUN/Creat Ratio 12 - 28 - - -  Sodium 135 - 145 mmol/L 139 140 141  Potassium 3.5 - 5.1 mmol/L 3.7 3.5 3.9  Chloride 101 - 111 mmol/L 101 103 103  CO2 22 - 32 mmol/L _1 Calcium 8.9 - 10.3 mg/dL 9.1 8.8(L) 9.0    Hepatic Function Latest Ref Rng & Units 12/20/2016 12/19/2016 12/17/2016  Total Protein 6.5 - 8.1 g/dL - - -  Albumin 3.5 - 5.0 g/dL 2.9(L) 2.2(L) 2.9(L)  AST 15 - 41 U/L - - -  ALT 14 - 54 U/L - - -  Alk Phosphatase 38 - 126 U/L - - -  Total Bilirubin 0.3 - 1.2 mg/dL - - -  Bilirubin, Direct 0.0 - 0.3 mg/dL - - -   IMAGING/STUDIES: PORT CXR  11/11: Previously reviewed by me. And reticular opacities predominantly in the lung bases with some areas of alveolar filling suggestive of a pneumonitis. PORT CXR 11/19: Previously reviewed by me. Questionable new right lower lung zone opacity. No pleural effusion appreciated. RUQ U/S 11/19:Unremarkable right upper quadrant ultrasound. CT CHEST W/O 11/21:  Previously reviewed by me. Focal dependent opacity posterior segment right lower lobe. Mucous plugging within left lower lobe noted. No pleural effusion. No pneumothorax. PORT CXR 11/22:  Previously reviewed by me. Questionable progression of left basilar  opacity. Low lung volumes today. No new focal opacity appreciated. Still retains good visualization of left hemidiaphragm. Tracheostomy in place.  MICROBIOLOGY: MRSA PCR 11/11:  Positive  Blood Cultures x2 11/11:  Negative  Tracheal Aspirate Culture 11/11:  MRSA Urine Streptococcal Antigen 11/11:  Negative Urine Legionella Antigen 11/11:  Negative  Urine Culture 11/18:  Negative  Blood Cultures x2 11/18 >>>neg Tracheal Aspirate Culture 11/19:  MRSA   ANTIBIOTICS: Merrem 11/11 - 11/13 Diflucan 11/18- 11/20 Nystatin Powder 11/20 >>> Vancomycin 11/11 - 11/17; 11/18 >>> (stop date 11/24) Tressie Ellis 11/18 >>> (stop date 11/24)  LINES/TUBES: Trach #6 (ENT) >>>  PEG >>> Foley >>>11/26 PIV  SIGNIFICANT EVENTS: 11/11 - Admit 11/12 - Worsening work of breathing & desat >> trach changed to cuffed #4 & placed on full vent support 11/16 - Transfer to vent SDU bed 11/17 - Transferred back to ICU w/ fever 11/21 - Trach up-sized by ENT to #6 cuffed  11/22 - Tolerated limited PS weaning 11/24 - To SDU  11/25 - ATC x 8-9 hours  11/26 - ATC 40% December 24, 2016 currently on pressure control ventilation with pressure control of 12.  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 Admitted with acute on chronic hypoxic respiratory failure secondary to pneumonia. Clinically improving. Tolerating limited pressure support weaning.   A Acute on chronic hypoxic respiratory failure requiring tracheostomy and mechanical ventilatory support.   Chronic mucous plugging Severe malnutrition  History of encephalitis - able to follow commands / communicate Urinary Retention Abnormal renal fxn that resolved, error? Mild loose stools  P:  Currently requiring high levels of pressure support ventilation to keep respiratory rate under 35. Continue the trachea #6 O2 support as needed for sats goals Bag mask valve lavage as needed Foley remains out, avoid placement Stools have improved on  own with current TF Family has [facility], we are calling and having social work to assess availbility.  We need to be off mechanical support if she goes there. Get PT consult help for rehab needs Rehab since she is not a candidate for in-house rehabilitation.  I suggested an LTAC. Lytes wnl, acoid phlebotomy, no labs needed in am  We discussed recent work up with DR [facility]aron [facility]ler at [facility], for her neuro declines, family would not want neuro assessment here at [facility], this can be continued as outpt or at [place] center (MRI and LP was recommended in future)  Richardson Landry Minor ACNP Maryanna Shape PCCM Pager [phone] till 1 pm If no answer page [phone] 12/24/2016, 12:00 PM  STAFF NOTE: I, Merrie Roof, MD FACP have personally reviewed patient's available data, including medical history, events of note, physical examination and test results as part of my evaluation. I have discussed with resident/NP and other care providers such as pharmacist, RN and RRT. In addition, I personally evaluated patient and elicited key findings of: awake, trach clean, on vent again, ronchi apical mild, abdo soft, low muscle mass, pcxr I  reviewed showed resolved atx / infiltrate, now back on vent, interestingly that her pcxr repeat is even better, must have had mucous plug event, PS weaning now is reasonable, hold off of any trach collar for weekend and keep pos pressure of some kind, in am to cpap 5 ps 5, consider mucomyst's x 48 hours and chest pt to continue given events, working on rehab but now back to vent will hinder placement for this as of now, has done well with urecholine, keep, suction frequent, I updated daughter in full at bedside, I do not feel this is reoccurence of PNA, when she weans we should accept rr 35-40 without desat with good TV given neurostatus  Lavon Paganini. Titus Mould, MD, [place] Pgr: [facility] 12/24/2016 1:52 PM

## 2016-12-24 NOTE — Progress Notes (Addendum)
NCM received information from L[place] from [facility] CIR , patient is not a candidate for [facility] CIR, patient has been put back on vent.  NCM also received vm from Tammy at the [facility], she states they will submit to insurance and see if they will approve , she does not have a lot of medical necessity, she is really borderline, but they will make the attempt per voicemail message.

## 2016-12-24 NOTE — Progress Notes (Signed)
Pt is not a candidate for inpt rehab admit. RN CM made aware. We will sign off. [phone]

## 2016-12-24 NOTE — Progress Notes (Signed)
Called by charge RN about patient having low oxygen saturations despite increasing oxygen via trach collar. Prior to this period of desaturations, patient was on TC 28%.  RT and RN suctioned patient and administered breathing treatment prior to my arrival and sats only improved to 87% from low 80s. Lung sounds were diminished, rhonchi L > R, patient was diaphoretic, increased WOB, but other VS were stable (BP/HR).  I called CCM MD in [facility] and CCM NP came to the bedside.  Prior to NP coming, patient spontaneously coughed and was suctioned again, thick secretions were suctioned out (mucus plug?) and after 2 passes, sats improved to 99%.    Plan: -- Transfer back to 70M for Vent Support

## 2016-12-24 NOTE — Progress Notes (Signed)
PCCM INTERVAL PROGRESS NOTE  Called to bedside to evaluate the patient for respiratory distress and hypoxia. Per RN patient was doing fine all night with good O2 sats and was comfortable. Last seen like this around 1230, then around 0200 patient noted to be hypoxemic into the 80s with increased WOB. Despite suctioning and breathing tx she remained this way. Upon repeat suction there were copious secretions obtained including some plugs per RT. Despite productive suctioning WOB remained increased. Sats somewhat improved.   Plan: Transfer to SDU to be placed on vent. Full support tonight SBT in AM PCXR Hypertonic saline nebs continue AM team to round  Georgann Housekeeper, AGACNP-BC [facility] Pulmonology/Critical Care Pager [phone] or [phone]  12/24/2016 2:31 AM

## 2016-12-24 NOTE — Progress Notes (Signed)
Noticed that patient's oxygen went down from 99 to 84%. Called RT, patient suctioned, inner cannula changed earlier,breathing treatment given and sats improved only to 87%. RR nurse and MD in [facility] notified. Patient was suctioned again, thick secretions where suctioned, sats improved to 99-100%.  Patient was transferred to 3M10 with her belongings. Husband called, left voice mail that patient is transferred back to [place], RN

## 2016-12-25 ENCOUNTER — Inpatient Hospital Stay (HOSPITAL_COMMUNITY): Payer: Medicare Other

## 2016-12-25 LAB — BASIC METABOLIC PANEL
Anion gap: 10 (ref 5–15)
BUN: 23 mg/dL — AB (ref 6–20)
CO2: 30 mmol/L (ref 22–32)
Calcium: 9 mg/dL (ref 8.9–10.3)
Chloride: 102 mmol/L (ref 101–111)
Glucose, Bld: 111 mg/dL — ABNORMAL HIGH (ref 65–99)
POTASSIUM: 3.3 mmol/L — AB (ref 3.5–5.1)
SODIUM: 142 mmol/L (ref 135–145)

## 2016-12-25 LAB — MAGNESIUM: MAGNESIUM: 2 mg/dL (ref 1.7–2.4)

## 2016-12-25 LAB — PHOSPHORUS: Phosphorus: 3.4 mg/dL (ref 2.5–4.6)

## 2016-12-25 LAB — GLUCOSE, CAPILLARY
GLUCOSE-CAPILLARY: 144 mg/dL — AB (ref 65–99)
Glucose-Capillary: 144 mg/dL — ABNORMAL HIGH (ref 65–99)
Glucose-Capillary: 110 mg/dL — ABNORMAL HIGH (ref 65–99)
Glucose-Capillary: 117 mg/dL — ABNORMAL HIGH (ref 65–99)
Glucose-Capillary: 114 mg/dL — ABNORMAL HIGH (ref 65–99)

## 2016-12-25 MED ORDER — POTASSIUM CHLORIDE 20 MEQ/15ML (10%) PO SOLN
20.0000 meq | Freq: Once | ORAL | Status: AC
  Administered 2016-12-25: 20 meq
  Filled 2016-12-25: qty 15

## 2016-12-25 NOTE — Progress Notes (Signed)
[place] [facility]   Physician Requesting Consult:Peter Messick, M.D. 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 Known chronic hypoxic respiratory failure. Recently diagnosed with breast cancer having undergone lumpectomy summer 2018. Lives at home with family and does not at times as well as make small attempts to verbalize. Recently she has been found to have a broken right hip and is wheelchair-bound at baseline. Presents with 1 day history of increasing purulent sputum production and increased work of breathing. Her mental status has been slightly worse than usual. No subjective fever, chills, or sweats. No recent sick contacts that the husband reports. Patient has been more restless at home recently. Her saturations have dropped into the 70s at times today prompting her husband to bring her to the emergency room. Through imaging she was subsequently found to have bibasilar infiltrates suspicious for healthcare associated pneumonia and was started on broad-spectrum antiviral therapy.  Subjective: Remains on PS . Rested well last pm. Husband at bedside with update.  She nods to him appropriately , appears comfortable on vent .     Vent Mode: PRVC FiO2 (%):  [30 %] 30 % Set Rate:  [12 bmp] 12 bmp Vt Set:  [400 mL] 400 mL PEEP:  [5 cmH20] 5 cmH20 Pressure Support:  [10 ZSM27-07 cmH20] 10 cmH20 Plateau Pressure:  [16 cmH20-18 cmH20] 18 cmH20  Temp:  [97.6 F (36.4 C)-100.3 F (37.9 C)] 99.3 F (37.4 C) (12/01 0723) Pulse Rate:  [72-189] 81 (12/01 0800) Resp:  [13-40] 29 (12/01 0800) BP: (105-155)/(67-113) 122/82 (12/01 0800) SpO2:  [94 %-100 %] 100 % (12/01 0838) FiO2 (%):  [30 %] 30 % (12/01 0840) Weight:  [97 lb 10.6 oz (44.3 kg)] 97 lb 10.6 oz (44.3 kg) (12/01 0217)   General: Frail ill-appearing cachectic white female on vent  HEENT: Trach /intact, no  increased redness at site  Neuro: opens eyes to voice, f/c some commands,  CV: Heart sounds are regular PULM: decreased bs in bases, no rhonchi noted.  EM:LJQG, non-tender, bsx4 active PEG in place Extremities: warm/dry, good of edema, extensive muscle wasting Skin: no rashes or lesions       CBC Latest Ref Rng & Units 12/21/2016 12/19/2016 12/17/2016  WBC 4.0 - 10.5 K/uL 5.3 5.5 8.4  Hemoglobin 12.0 - 15.0 g/dL 12.1 10.9(L) 11.7(L)  Hematocrit 36.0 - 46.0 % 37.8 35.1(L) 37.1  Platelets 150 - 400 K/uL 318 312 262    BMP Latest Ref Rng & Units 12/25/2016 12/23/2016 12/20/2016  Glucose 65 - 99 mg/dL 111(H) 138(H) 92  BUN 6 - 20 mg/dL 23(H) 19 17  Creatinine 0.44 - 1.00 mg/dL <0.30(L) 0.30(L) <0.30(L)  BUN/Creat Ratio 12 - 28 - - -  Sodium 135 - 145 mmol/L 142 139 140  Potassium 3.5 - 5.1 mmol/L 3.3(L) 3.7 3.5  Chloride 101 - 111 mmol/L 102 101 103  CO2 22 - 32 mmol/L '30 28 29  '$ Calcium 8.9 - 10.3 mg/dL 9.0 9.1 8.8(L)    Hepatic Function Latest Ref Rng & Units 12/20/2016 12/19/2016 12/17/2016  Total Protein 6.5 - 8.1 g/dL - - -  Albumin 3.5 - 5.0 g/dL 2.9(L) 2.2(L) 2.9(L)  AST 15 - 41 U/L - - -  ALT 14 - 54 U/L - - -  Alk Phosphatase 38 - 126 U/L - - -  Total Bilirubin 0.3 - 1.2 mg/dL - - -  Bilirubin, Direct 0.0 - 0.3 mg/dL - - -  IMAGING/STUDIES: PORT CXR 11/11: Previously reviewed by me. And reticular opacities predominantly in the lung bases with some areas of alveolar filling suggestive of a pneumonitis. PORT CXR 11/19: Previously reviewed by me. Questionable new right lower lung zone opacity. No pleural effusion appreciated. RUQ U/S 11/19:Unremarkable right upper quadrant ultrasound. CT CHEST W/O 11/21:  Previously reviewed by me. Focal dependent opacity posterior segment right lower lobe. Mucous plugging within left lower lobe noted. No pleural effusion. No pneumothorax. PORT CXR 11/22:  Previously reviewed by me. Questionable progression of left basilar opacity.  Low lung volumes today. No new focal opacity appreciated. Still retains good visualization of left hemidiaphragm. Tracheostomy in place.  MICROBIOLOGY: MRSA PCR 11/11:  Positive  Blood Cultures x2 11/11:  Negative  Tracheal Aspirate Culture 11/11:  MRSA Urine Streptococcal Antigen 11/11:  Negative Urine Legionella Antigen 11/11:  Negative  Urine Culture 11/18:  Negative  Blood Cultures x2 11/18 >>>neg Tracheal Aspirate Culture 11/19:  MRSA   ANTIBIOTICS: Merrem 11/11 - 11/13 Diflucan 11/18- 11/20 Nystatin Powder 11/20 >>> Vancomycin 11/11 - 11/17; 11/18 >>> (stop date 11/24) Tressie Ellis 11/18 >>> (stop date 11/24)  LINES/TUBES: Trach #6 (ENT) >>>  PEG >>> Foley >>>11/26 PIV  SIGNIFICANT EVENTS: 11/11 - Admit 11/12 - Worsening work of breathing & desat >> trach changed to cuffed #4 & placed on full vent support 11/16 - Transfer to vent SDU bed 11/17 - Transferred back to ICU w/ fever 11/21 - Trach up-sized by ENT to #6 cuffed  11/22 - Tolerated limited PS weaning 11/24 - To SDU  11/25 - ATC x 8-9 hours  11/26 - ATC 40% December 24, 2016 currently on pressure control ventilation with pressure control of 12.  ASSESSMENT/PLAN:  65 y.o. female with chronic tracheostomy. Admitted with acute on chronic hypoxic respiratory failure secondary to pneumonia. Clinically improving. Tolerating limited pressure support weaning.   A Acute on chronic hypoxic respiratory failure requiring tracheostomy and mechanical ventilatory support.   Chronic mucous plugging Severe malnutrition  History of encephalitis - able to follow commands / communicate Urinary Retention Abnormal renal fxn that resolved, error? Mild loose stools-improved  Hypokalemia   P:  Currently tolerating PS vent support . Will assess for trach collar weaning as able .  Continue the trachea #6 O2 support as needed for sats goals Bag mask valve lavage as needed Foley remains out, avoid placement Stools have improved on  own with current TF Family has requested [facility], we are calling and having social work to assess availbility.  We need to be off mechanical support if she goes there. Get PT consult help for rehab needs Rehab since she is not a candidate for in-house rehabilitation.    Avoid phlebotomy as able .  Discussion -recent work up with DR Shaaron Adler at [facility], for her neuro declines, family would not want neuro assessment here at [facility], this can be continued as outpt or at [place] center (MRI and LP was recommended in future) Replace K+   Nadene Witherspoon NP-C   Pulmonary and Critical Care  [phone]   12/25/2016, 10:42 AM

## 2016-12-25 NOTE — Progress Notes (Signed)
Nutrition Brief Note  Pt noted to be new ventilated patient.   Chart reviewed. Patient is chronic trach and she has been working with respiratory therapists for weeks regarding weaning. She has been waxing and waning between ATC and trach collar recently. Was placed on Vent support very early yesterday due to mucous plugging. She is planned to remain on pressure support this weekend for rest. Due to perceived short need for ventilator support, no alterations to tube feeding warranted. She has PEG and their have been no interruptions in tube feeds.   No nutrition interventions warranted at this time. If nutrition issues arise, please consult RD.   Burtis Junes RD, LDN, CNSC Clinical Nutrition Pager: [phone] 12/25/2016 1:39 PM

## 2016-12-25 NOTE — Evaluation (Signed)
Occupational Therapy Evaluation Patient Details Name: Judy Spears MRN: [id number] DOB: 03-20-1950 Today's Date: 12/25/2016    History of Present Illness 66 yo female with acute on chronic respiratory failure from HCAP. 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 Had initial improvement and transferred to vent SDU. Developed progressive fever, increase WOB, and rash around G tube site. Transferred back to ICU.   Clinical Impression   Pt admitted with above. She demonstrates the below listed deficits and will benefit from continued OT to maximize safety and independence with BADLs.  Pt presents to OT with contractures bil. UEs; pain, limitations with head/neck ROM as well as trunk ROM and control, impaired balance, impaired cognition.   She lives with spouse and has hired caregiver.  Prior to this admission, she was requiring max - total A for ADLs and total A for transfers.  Currently, she requires total A for all ADLs, and is unable to safely transfer at this time.  She will benefit from extensive inpatient rehab to allow her to maximize her safety and independence with ADLs in order to return home.        Follow Up Recommendations  CIR;Supervision/Assistance - 24 hour    Equipment Recommendations  None recommended by OT    Recommendations for Other Services Rehab consult     Precautions / Restrictions Precautions Precautions: Fall Precaution Comments: vent/trach, PEG      Mobility Bed Mobility Overal bed mobility: Needs Assistance   Rolling: Total assist            Transfers Overall transfer level: Needs assistance               General transfer comment: unable to safely assist with +1 assist     Balance                                           ADL either performed or assessed with clinical judgement   ADL Overall ADL's : Needs assistance/impaired Eating/Feeding: NPO   Grooming: Oral  care;Wash/dry face;Wash/dry hands;Brushing hair;Total assistance;Bed level Grooming Details (indicate cue type and reason): Pt reached up to scratch her face with Rt UE with max faciliation/assist  Upper Body Bathing: Total assistance;Bed level   Lower Body Bathing: Total assistance;Bed level   Upper Body Dressing : Total assistance;Bed level   Lower Body Dressing: Total assistance;Bed level   Toilet Transfer: Total assistance Toilet Transfer Details (indicate cue type and reason): unable          Functional mobility during ADLs: Total assistance       Vision   Additional Comments: Pt makes eye contact and tracks therapist      Perception     Praxis      Pertinent Vitals/Pain Pain Assessment: Faces Faces Pain Scale: Hurts little more Pain Location: uncertain  Pain Descriptors / Indicators: Grimacing;Restless Pain Intervention(s): Repositioned     Hand Dominance Right   Extremity/Trunk Assessment Upper Extremity Assessment Upper Extremity Assessment: RUE deficits/detail;LUE deficits/detail RUE Deficits / Details: Pt with shoulder AAROM limited to ~80* shoulder flexion and ~70* Abduction.  Elbow AAROM WFL.  PROM fingers MCP flexion contractures to ~70* RUE Coordination: decreased fine motor;decreased gross motor LUE Deficits / Details: Pt demonstrates repetitive, rythmic movement bil. hands/fingers. Pt with flexor contractures.  Shoulder limited AAROm 80* shoulder flexion and ~70* abduction AAROM; elbow limited ~-  90* extension.  PROM wrist to neutral after wrist mobilization, IP extension WFL, MCP contractures ~70*  LUE Coordination: decreased fine motor;decreased gross motor   Lower Extremity Assessment Lower Extremity Assessment: Defer to PT evaluation   Cervical / Trunk Assessment Cervical / Trunk Assessment: Other exceptions Cervical / Trunk Exceptions: Pt maintains flexed posture in bed.   Did not fully assess trunk due to hip pain/discomfort.  Head/neck rotated  to Lt.  Able to achive ~2/3 AAROM neck rotation to th e Rt    Communication Communication Communication: Tracheostomy   Cognition Arousal/Alertness: Awake/alert Behavior During Therapy: [facility] for tasks assessed/performed;Restless Overall Cognitive Status: Difficult to assess                                 General Comments: Pt followed one step commands (assisted with UE, head/neck and LE exercises.  Shakes head yes/and no with cues    General Comments       Exercises Other Exercises Other Exercises: Soft tissue mobilization performed bil. scapulas, shoulders, Lt elbow ,bil wrists and hands followed by AAROM and passive stretch    Shoulder Instructions      Home Living Family/patient expects to be discharged to:: Inpatient rehab Living Arrangements: Spouse/significant other Available Help at Discharge: Family;Personal care attendant;Available 24 hours/day Type of Home: House Home Access: Ramped entrance     Home Layout: Two level;Bed/bath upstairs Alternate Level Stairs-Number of Steps: chair lift   Bathroom Shower/Tub: Walk-in shower   Bathroom Toilet: Handicapped height Bathroom Accessibility: Yes How Accessible: Accessible via wheelchair;Accessible via walker [facility] bed;Wheelchair - manual;Tub bench;Hand held shower head;Bedside commode;Walker - 2 wheels   Additional Comments: Pt's husband provided all info about home       Prior Functioning/Environment Level of Independence: Needs assistance  Gait / Transfers Assistance Needed: Had been using stedy the last couple of months to get to [facility]. Is WC (tilt in space) dependent at baseline ADL's / Homemaking Assistance Needed: max assist with bathing, dressing Communication / Swallowing Assistance Needed: trach          OT Problem List: Decreased strength;Decreased range of motion;Decreased activity tolerance;Impaired balance (sitting and/or standing);Impaired vision/perception;Decreased  coordination;Decreased cognition;Decreased safety awareness;Decreased knowledge of use of DME or AE;Cardiopulmonary status limiting activity;Impaired tone;Impaired UE functional use;Pain      OT Treatment/Interventions: Self-care/ADL training;Neuromuscular education;DME and/or AE instruction;Therapeutic activities;Cognitive remediation/compensation;Visual/perceptual remediation/compensation;Patient/family education;Balance training;Manual therapy;Splinting    OT Goals(Current goals can be found in the care plan section) Acute Rehab OT Goals Patient Stated Goal: to regain function  OT Goal Formulation: With patient/family Time For Goal Achievement: 01/08/17 Potential to Achieve Goals: Good ADL Goals Pt Will Perform Grooming: with max assist;sitting;bed level Additional ADL Goal #1: Pt will maintain EOB sitting with mod A/facilitation in prep for ADLs Additional ADL Goal #2: Pt will pivot to BSC with max A +2 50% of time Additional ADL Goal #3: Pt will actively turn head 75%  to Rt when prompted to locate items to improve head/neck ROM  OT Frequency: Min 2X/week   Barriers to D/C:            Co-evaluation              AM-PAC PT "6 Clicks" Daily Activity     Outcome Measure Help from another person eating meals?: Total Help from another person taking care of personal grooming?: Total Help from another person toileting, which includes using toliet, bedpan,  or urinal?: Total Help from another person bathing (including washing, rinsing, drying)?: Total Help from another person to put on and taking off regular upper body clothing?: Total Help from another person to put on and taking off regular lower body clothing?: Total 6 Click Score: 6   End of Session Equipment Utilized During Treatment: Oxygen Nurse Communication: Mobility status  Activity Tolerance: Patient tolerated treatment well Patient left: in bed;with call bell/phone within reach;with family/visitor present  OT Visit  Diagnosis: Muscle weakness (generalized) (M62.81)                Time: 9476-5465 OT Time Calculation (min): 38 min Charges:  OT General Charges $OT Visit: 1 Visit OT Evaluation $OT Eval High Complexity: 1 High OT Treatments $Neuromuscular Re-education: 23-37 mins G-Codes:     [facility], OTR/L [phone]   Lucille Passy M 12/25/2016, 6:57 PM

## 2016-12-26 LAB — GLUCOSE, CAPILLARY
GLUCOSE-CAPILLARY: 109 mg/dL — AB (ref 65–99)
Glucose-Capillary: 102 mg/dL — ABNORMAL HIGH (ref 65–99)
Glucose-Capillary: 105 mg/dL — ABNORMAL HIGH (ref 65–99)
Glucose-Capillary: 112 mg/dL — ABNORMAL HIGH (ref 65–99)

## 2016-12-26 LAB — BASIC METABOLIC PANEL
ANION GAP: 9 (ref 5–15)
BUN: 19 mg/dL (ref 6–20)
CALCIUM: 9.2 mg/dL (ref 8.9–10.3)
CO2: 29 mmol/L (ref 22–32)
CREATININE: 0.31 mg/dL — AB (ref 0.44–1.00)
Chloride: 102 mmol/L (ref 101–111)
Glucose, Bld: 136 mg/dL — ABNORMAL HIGH (ref 65–99)
Potassium: 3.4 mmol/L — ABNORMAL LOW (ref 3.5–5.1)
SODIUM: 140 mmol/L (ref 135–145)

## 2016-12-26 MED ORDER — ACETAMINOPHEN 160 MG/5ML PO SOLN
500.0000 mg | Freq: Four times a day (QID) | ORAL | Status: DC | PRN
  Administered 2016-12-26 – 2017-01-04 (×7): 500 mg
  Filled 2016-12-26 (×7): qty 20.3

## 2016-12-26 MED ORDER — POLYETHYLENE GLYCOL 3350 17 G PO PACK
17.0000 g | PACK | Freq: Every day | ORAL | Status: AC | PRN
  Administered 2016-12-26: 17 g via ORAL
  Filled 2016-12-26: qty 1

## 2016-12-26 MED ORDER — POTASSIUM CHLORIDE 20 MEQ/15ML (10%) PO SOLN
20.0000 meq | Freq: Once | ORAL | Status: AC
  Administered 2016-12-26: 20 meq
  Filled 2016-12-26: qty 15

## 2016-12-26 NOTE — Progress Notes (Signed)
[facility]   Physician Requesting Consult:Peter Messick, M.D. 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 Known chronic hypoxic respiratory failure. Recently diagnosed with breast cancer having undergone lumpectomy summer 2018. Lives at home with family and does not at times as well as make small attempts to verbalize. Recently she has been found to have a broken right hip and is wheelchair-bound at baseline. Presents with 1 day history of increasing purulent sputum production and increased work of breathing. Her mental status has been slightly worse than usual. No subjective fever, chills, or sweats. No recent sick contacts that the husband reports. Patient has been more restless at home recently. Her saturations have dropped into the 70s at times today prompting her husband to bring her to the emergency room. Through imaging she was subsequently found to have bibasilar infiltrates suspicious for healthcare associated pneumonia and was started on broad-spectrum antiviral therapy.  Subjective: Remains on PS on vent. Tolerating well.  Husband at bedside , updated.  She is very alert this am. Appears comfortable. Nods to questions.  No BM x 3 days -no pain, fever, or vomiting .     Vent Mode: PRVC FiO2 (%):  [30 %] 30 % Set Rate:  [12 bmp] 12 bmp Vt Set:  [400 mL] 400 mL PEEP:  [5 cmH20] 5 cmH20 Pressure Support:  [12 cmH20] 12 cmH20 Plateau Pressure:  [16 cmH20-20 cmH20] 19 cmH20  Temp:  [98.6 F (37 C)-100 F (37.8 C)] 100 F (37.8 C) (12/02 0803) Pulse Rate:  [73-88] 77 (12/02 0905) Resp:  [13-37] 24 (12/02 0905) BP: (122-178)/(81-95) 135/82 (12/02 0905) SpO2:  [98 %-100 %] 98 % (12/02 0905) FiO2 (%):  [30 %] 30 % (12/02 0905) Weight:  [99 lb 6.8 oz (45.1 kg)] 99 lb 6.8 oz (45.1 kg) (12/02 0341)   General: Frail ill-appearing cachectic white female on  vent  HEENT: Trach /intact, no increased redness at site  Neuro: opens eyes to voice, f/c some commands,  CV: Heart sounds are regular PULM: decreased bs in bases, few trace rhonchi  BD:ZHGD, non-tender, bsx4 active PEG in place/TF  Extremities: warm/dry, good of edema, extensive muscle wasting Skin: no rashes or lesions       CBC Latest Ref Rng & Units 12/21/2016 12/19/2016 12/17/2016  WBC 4.0 - 10.5 K/uL 5.3 5.5 8.4  Hemoglobin 12.0 - 15.0 g/dL 12.1 10.9(L) 11.7(L)  Hematocrit 36.0 - 46.0 % 37.8 35.1(L) 37.1  Platelets 150 - 400 K/uL 318 312 262    BMP Latest Ref Rng & Units 12/26/2016 12/25/2016 12/23/2016  Glucose 65 - 99 mg/dL 136(H) 111(H) 138(H)  BUN 6 - 20 mg/dL 19 23(H) 19  Creatinine 0.44 - 1.00 mg/dL 0.31(L) <0.30(L) 0.30(L)  BUN/Creat Ratio 12 - 28 - - -  Sodium 135 - 145 mmol/L 140 142 139  Potassium 3.5 - 5.1 mmol/L 3.4(L) 3.3(L) 3.7  Chloride 101 - 111 mmol/L 102 102 101  CO2 22 - 32 mmol/L '29 30 28  '$ Calcium 8.9 - 10.3 mg/dL 9.2 9.0 9.1    Hepatic Function Latest Ref Rng & Units 12/20/2016 12/19/2016 12/17/2016  Total Protein 6.5 - 8.1 g/dL - - -  Albumin 3.5 - 5.0 g/dL 2.9(L) 2.2(L) 2.9(L)  AST 15 - 41 U/L - - -  ALT 14 - 54 U/L - - -  Alk Phosphatase 38 - 126 U/L - - -  Total Bilirubin 0.3 -  1.2 mg/dL - - -  Bilirubin, Direct 0.0 - 0.3 mg/dL - - -   IMAGING/STUDIES: PORT CXR 11/11: Previously reviewed by me. And reticular opacities predominantly in the lung bases with some areas of alveolar filling suggestive of a pneumonitis. PORT CXR 11/19: Previously reviewed by me. Questionable new right lower lung zone opacity. No pleural effusion appreciated. RUQ U/S 11/19:Unremarkable right upper quadrant ultrasound. CT CHEST W/O 11/21:  Previously reviewed by me. Focal dependent opacity posterior segment right lower lobe. Mucous plugging within left lower lobe noted. No pleural effusion. No pneumothorax. PORT CXR 11/22:  Previously reviewed by me. Questionable  progression of left basilar opacity. Low lung volumes today. No new focal opacity appreciated. Still retains good visualization of left hemidiaphragm. Tracheostomy in place.  MICROBIOLOGY: MRSA PCR 11/11:  Positive  Blood Cultures x2 11/11:  Negative  Tracheal Aspirate Culture 11/11:  MRSA Urine Streptococcal Antigen 11/11:  Negative Urine Legionella Antigen 11/11:  Negative  Urine Culture 11/18:  Negative  Blood Cultures x2 11/18 >>>neg Tracheal Aspirate Culture 11/19:  MRSA   ANTIBIOTICS: Merrem 11/11 - 11/13 Diflucan 11/18- 11/20 Nystatin Powder 11/20 >>> Vancomycin 11/11 - 11/17; 11/18 >>> (stop date 11/24) Tressie Ellis 11/18 >>> (stop date 11/24)  LINES/TUBES: Trach #6 (ENT) >>>  PEG >>> Foley >>>11/26 PIV  SIGNIFICANT EVENTS: 11/11 - Admit 11/12 - Worsening work of breathing & desat >> trach changed to cuffed #4 & placed on full vent support 11/16 - Transfer to vent SDU bed 11/17 - Transferred back to ICU w/ fever 11/21 - Trach up-sized by ENT to #6 cuffed  11/22 - Tolerated limited PS weaning 11/24 - To SDU  11/25 - ATC x 8-9 hours  11/26 - ATC 40% December 24, 2016 currently on pressure control ventilation with pressure control of 12.  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 Admitted with acute on chronic hypoxic respiratory failure secondary to pneumonia. Clinically improving. Tolerating limited pressure support weaning.   A Acute on chronic hypoxic respiratory failure requiring tracheostomy and mechanical ventilatory support.   Chronic mucous plugging Severe malnutrition  History of encephalitis - able to follow commands / communicate Urinary Retention Abnormal renal fxn that resolved, error? Mild loose stools-resolved  Constipation (last bm 11/28)  Hypokalemia   P:  Currently tolerating PS vent support . Will assess for trach collar weaning as able .  Continue the trachea #6 O2 support as needed for sats goals Bag mask valve lavage as  needed Foley remains out, avoid placement Stools have improved on own with current TF Family has requested [facility], we are calling and having social work to assess availbility.  We need to be off mechanical support if she goes there. PT/OT following  Rehab since she is not a candidate for in-house rehabilitation.    Avoid phlebotomy as able .  Discussion -recent work up with DR Shaaron Adler at [facility], for her neuro declines, family would not want neuro assessment here at [facility], this can be continued as outpt or at [facility] (MRI and LP was recommended in future) Replace K+  Add Miralax As needed  X 1   Christiane Sistare NP-C  [facility]  [phone]    12/26/2016, 9:37 AM

## 2016-12-26 NOTE — Progress Notes (Signed)
Pt w/ small run (~11 beats) of SVT. Now back in NSR.  Work of breathing has recently increased with shallow respirations. Spoke w/ RT who has put pt back on [facility]

## 2016-12-26 NOTE — Progress Notes (Signed)
Pt had run of SVT so held CPT at this time

## 2016-12-26 NOTE — Plan of Care (Signed)
Discussed plan of care and pain management with no teach back displayed at this time

## 2016-12-27 DIAGNOSIS — Z930 Tracheostomy status: Secondary | ICD-10-CM

## 2016-12-27 DIAGNOSIS — R29898 Other symptoms and signs involving the musculoskeletal system: Secondary | ICD-10-CM

## 2016-12-27 LAB — BASIC METABOLIC PANEL
ANION GAP: 8 (ref 5–15)
BUN: 23 mg/dL — ABNORMAL HIGH (ref 6–20)
CALCIUM: 9.2 mg/dL (ref 8.9–10.3)
CO2: 30 mmol/L (ref 22–32)
Chloride: 101 mmol/L (ref 101–111)
Creatinine, Ser: 0.31 mg/dL — ABNORMAL LOW (ref 0.44–1.00)
GFR calc Af Amer: >60 mL/min (ref >60)
GLUCOSE: 99 mg/dL (ref 65–99)
Potassium: 3.8 mmol/L (ref 3.5–5.1)
SODIUM: 139 mmol/L (ref 135–145)

## 2016-12-27 LAB — GLUCOSE, CAPILLARY
GLUCOSE-CAPILLARY: 124 mg/dL — AB (ref 65–99)
GLUCOSE-CAPILLARY: 132 mg/dL — AB (ref 65–99)
Glucose-Capillary: 105 mg/dL — ABNORMAL HIGH (ref 65–99)
Glucose-Capillary: 110 mg/dL — ABNORMAL HIGH (ref 65–99)
Glucose-Capillary: 119 mg/dL — ABNORMAL HIGH (ref 65–99)
Glucose-Capillary: 89 mg/dL (ref 65–99)

## 2016-12-27 MED ORDER — BETHANECHOL CHLORIDE 10 MG PO TABS
5.0000 mg | ORAL_TABLET | Freq: Two times a day (BID) | ORAL | Status: DC
  Administered 2016-12-27 – 2017-01-02 (×12): 5 mg via ORAL
  Filled 2016-12-27 (×12): qty 1

## 2016-12-27 NOTE — Progress Notes (Addendum)
SLP Cancellation Note  Patient Details Name: Judy Spears MRN: [id number] DOB: [birth year]   Cancelled treatment:       Reason Eval/Treat Not Completed: Medical issues which prohibited therapy - on the vent still this morning. She required mechanical ventilation due to mucus plugging, and so she also still has a #6 cuffed trach. Will continue to follow for readiness to participate in additional PMV trials.   Germain Osgood 12/27/2016, 8:53 AM  Germain Osgood, M.A. CCC-SLP [phone]

## 2016-12-27 NOTE — Progress Notes (Signed)
Occupational Therapy Treatment Patient Details Name: Judy Spears MRN: [id number] DOB: [birth year] Today's Date: 12/27/2016    History of present illness 66 yo female with acute on chronic respiratory failure from HCAP. 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 Had initial improvement and transferred to vent SDU. Developed progressive fever, increase WOB, and rash around G tube site. Transferred back to ICU.  Of note: pt's hip fx has not been repaired.   OT comments  Focus of session on tone reduction techniques, UE ROM, trunk control and sitting balance at EOB. Pt able to right her trunk with min assist when returning to midline from R and with max assist from L. Performed squat-pivot to pt's w/c with +2 total assist. Pt with stable VS throughout session.   Follow Up Recommendations  Supervision/Assistance - 24 hour(Inpatient rehab)    Equipment Recommendations  None recommended by OT    Recommendations for Other Services      Precautions / Restrictions Precautions Precautions: Fall Precaution Comments: vent/trach, PEG       Mobility Bed Mobility Overal bed mobility: Needs Assistance Bed Mobility: Rolling;Sidelying to Sit Rolling: Total assist;+2 for physical assistance Sidelying to sit: +2 for physical assistance;Total assist       General bed mobility comments: rolling/trunk rotation to reduce tone prior to sitting up, +2 total for all aspects of bed mobility.  Transfers Overall transfer level: Needs assistance Equipment used: (use of pad and gait belt) Transfers: Squat Pivot Transfers     Squat pivot transfers: Total assist;+2 physical assistance;+2 safety/equipment     General transfer comment: transfered bed to her w/c toward L with arm of chair lowered    Balance Overall balance assessment: Needs assistance Sitting-balance support: Feet supported;No upper extremity supported Sitting balance-Leahy Scale:  Poor Sitting balance - Comments: total assist initially, progressed to mod to min briefly after activating trunk with lateral flexion onto each elbow Postural control: Posterior lean;Left lateral lean                                 ADL either performed or assessed with clinical judgement   ADL       Grooming: Brushing hair;Sitting;Total assistance                                       Vision       Perception     Praxis      Cognition Arousal/Alertness: Awake/alert Behavior During Therapy: WFL for tasks assessed/performed;Restless Overall Cognitive Status: Difficult to assess                                 General Comments: followed one step commands, with weight bearing on each elbow and return to midline in sitting, slow response speed with yes/no        Exercises Exercises: General Upper Extremity General Exercises - Upper Extremity Shoulder Flexion: PROM;10 reps;Supine;Both Elbow Extension: AAROM;Both;10 reps;Supine Other Exercises Other Exercises: facilitated scapular adduction and depression as pt was sitting at EOB   Shoulder Instructions       General Comments      Pertinent Vitals/ Pain       Pain Assessment: Faces Faces Pain Scale: Hurts little more Pain Location: uncertain  Pain Descriptors /  Indicators: Grimacing;Guarding Pain Intervention(s): Monitored during session;Repositioned  Home Living                                          Prior Functioning/Environment              Frequency  Min 2X/week        Progress Toward Goals  OT Goals(current goals can now be found in the care plan section)  Progress towards OT goals: Progressing toward goals  Acute Rehab OT Goals Patient Stated Goal: to regain function  OT Goal Formulation: With patient/family Time For Goal Achievement: 01/08/17 Potential to Achieve Goals: [place] Discharge plan remains appropriate     Co-evaluation    PT/OT/SLP Co-Evaluation/Treatment: Yes Reason for Co-Treatment: Complexity of the patient's impairments (multi-system involvement);For patient/therapist safety   OT goals addressed during session: Strengthening/ROM      AM-PAC PT "6 Clicks" Daily Activity     Outcome Measure   Help from another person eating meals?: Total Help from another person taking care of personal grooming?: Total Help from another person toileting, which includes using toliet, bedpan, or urinal?: Total Help from another person bathing (including washing, rinsing, drying)?: Total Help from another person to put on and taking off regular upper body clothing?: Total Help from another person to put on and taking off regular lower body clothing?: Total 6 Click Score: 6    End of Session Equipment Utilized During Treatment: Oxygen  OT Visit Diagnosis: Muscle weakness (generalized) (M62.81)   Activity Tolerance Patient tolerated treatment well(VSS)   Patient Left Other (comment);with nursing/sitter in room;with family/visitor present(in w/c)   Nurse Communication Mobility status        Time: 6160-7371 OT Time Calculation (min): 43 min  Charges: OT General Charges $OT Visit: 1 Visit OT Treatments $Therapeutic Activity: 8-22 mins  12/27/2016 Nestor Lewandowsky, OTR/L Pager: [place], Judy Spears 12/27/2016, 3:31 PM

## 2016-12-27 NOTE — Plan of Care (Signed)
Discussed with patient and husband plan of care for the evening, pain management, giving her leg a rest from the blood pressure cuff and making sure to watch for mucous plugs tonight with some teach back displayed

## 2016-12-27 NOTE — Progress Notes (Signed)
[facility]   Physician Requesting Consult:Peter Messick, M.D. 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 Known chronic hypoxic respiratory failure. Recently diagnosed with breast cancer having undergone lumpectomy summer 2018. Lives at home with family and does not at times as well as make small attempts to verbalize. Recently she has been found to have a broken right hip and is wheelchair-bound at baseline. Presents with 1 day history of increasing purulent sputum production and increased work of breathing. Her mental status has been slightly worse than usual. No subjective fever, chills, or sweats. No recent sick contacts that the husband reports. Patient has been more restless at home recently. Her saturations have dropped into the 70s at times today prompting her husband to bring her to the emergency room. Through imaging she was subsequently found to have bibasilar infiltrates suspicious for healthcare associated pneumonia and was started on broad-spectrum antiviral therapy.  Subjective:  On ATC, tolerating well.  Trach collar yesterday for ~ 7 hours.  Afebrile.     Vent Mode: PSV;CPAP FiO2 (%):  [30 %-40 %] 40 % Set Rate:  [12 bmp] 12 bmp Vt Set:  [400 mL] 400 mL PEEP:  [5 cmH20] 5 cmH20 Pressure Support:  [12 cmH20] 12 cmH20 Plateau Pressure:  [15 cmH20-19 cmH20] 15 cmH20  Temp:  [97.9 F (36.6 C)-100.1 F (37.8 C)] 98.7 F (37.1 C) (12/03 1212) Pulse Rate:  [71-81] 80 (12/03 1218) Resp:  [19-38] 25 (12/03 1218) BP: (130-174)/(43-89) 138/43 (12/03 1212) SpO2:  [96 %-100 %] 100 % (12/03 1218) FiO2 (%):  [30 %-40 %] 40 % (12/03 1218) Weight:  [104 lb 0.9 oz (47.2 kg)] 104 lb 0.9 oz (47.2 kg) (12/03 0338)   General: chronically ill appearing female in NAD   HEENT: MM pink/moist, trach midline c/d/i, able to cough / clear secretions  Neuro: awake, nods  appropriately, extremity spasms noted (chronic) CV: s1s2 rrr, no m/r/g PULM: even/non-labored, lungs bilaterally coarse, creamy tracheal secretions  ZO:XWRU, non-tender, bsx4 active  Extremities: warm/dry, no edema  Skin: no rashes or lesions   CBC Latest Ref Rng & Units 12/21/2016 12/19/2016 12/17/2016  WBC 4.0 - 10.5 K/uL 5.3 5.5 8.4  Hemoglobin 12.0 - 15.0 g/dL 12.1 10.9(L) 11.7(L)  Hematocrit 36.0 - 46.0 % 37.8 35.1(L) 37.1  Platelets 150 - 400 K/uL 318 312 262    BMP Latest Ref Rng & Units 12/27/2016 12/26/2016 12/25/2016  Glucose 65 - 99 mg/dL 99 136(H) 111(H)  BUN 6 - 20 mg/dL 23(H) 19 23(H)  Creatinine 0.44 - 1.00 mg/dL 0.31(L) 0.31(L) <0.30(L)  BUN/Creat Ratio 12 - 28 - - -  Sodium 135 - 145 mmol/L 139 140 142  Potassium 3.5 - 5.1 mmol/L 3.8 3.4(L) 3.3(L)  Chloride 101 - 111 mmol/L 101 102 102  CO2 22 - 32 mmol/L _0 Calcium 8.9 - 10.3 mg/dL 9.2 9.2 9.0    Hepatic Function Latest Ref Rng & Units 12/20/2016 12/19/2016 12/17/2016  Total Protein 6.5 - 8.1 g/dL - - -  Albumin 3.5 - 5.0 g/dL 2.9(L) 2.2(L) 2.9(L)  AST 15 - 41 U/L - - -  ALT 14 - 54 U/L - - -  Alk Phosphatase 38 - 126 U/L - - -  Total Bilirubin 0.3 - 1.2 mg/dL - - -  Bilirubin, Direct 0.0 - 0.3 mg/dL - - -   IMAGING/STUDIES: PORT CXR 11/11: Previously reviewed by me. And reticular  opacities predominantly in the lung bases with some areas of alveolar filling suggestive of a pneumonitis. PORT CXR 11/19: Previously reviewed by me. Questionable new right lower lung zone opacity. No pleural effusion appreciated. RUQ U/S 11/19:Unremarkable right upper quadrant ultrasound. CT CHEST W/O 11/21:  Previously reviewed by me. Focal dependent opacity posterior segment right lower lobe. Mucous plugging within left lower lobe noted. No pleural effusion. No pneumothorax. PORT CXR 11/22:  Previously reviewed by me. Questionable progression of left basilar opacity. Low lung volumes today. No new focal opacity  appreciated. Still retains good visualization of left hemidiaphragm. Tracheostomy in place.  MICROBIOLOGY: MRSA PCR 11/11:  Positive  Blood Cultures x2 11/11:  Negative  Tracheal Aspirate Culture 11/11:  MRSA Urine Streptococcal Antigen 11/11:  Negative Urine Legionella Antigen 11/11:  Negative  Urine Culture 11/18:  Negative  Blood Cultures x2 11/18 >> neg Tracheal Aspirate Culture 11/19:  MRSA   ANTIBIOTICS: Merrem 11/11 - 11/13 Diflucan 11/18- 11/20 Nystatin Powder 11/20 >>> Vancomycin 11/11 - 11/17; 11/18 >>> (stop date 11/24) Tressie Ellis 11/18 >>> (stop date 11/24)  LINES/TUBES: Trach #6 (ENT) >>>  PEG >>> Foley >>>11/26 PIV  SIGNIFICANT EVENTS: 11/11 - Admit 11/12 - Worsening work of breathing & desat >> trach changed to cuffed #4 & placed on full vent support 11/16 - Transfer to vent SDU bed 11/17 - Transferred back to ICU w/ fever 11/21 - Trach up-sized by ENT to #6 cuffed  11/22 - Tolerated limited PS weaning 11/24 - To SDU  11/25 - ATC x 8-9 hours  11/26 - ATC 40% 11/30 - currently on pressure control ventilation with pressure control of 12. 11/29 - episode of mucus plugging, back on vent 12/02 - weaned 7 hours on ATC, rested on vent overnight 12/03 - ATC   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 Admitted with acute on chronic hypoxic respiratory failure secondary to pneumonia. Clinically improving. Tolerating limited pressure support weaning.  A: Acute on chronic hypoxic respiratory failure requiring tracheostomy and mechanical ventilatory support.   Chronic mucous plugging Severe malnutrition  History of encephalitis - able to follow commands / communicate Urinary Retention Abnormal renal fxn that resolved, error? Mild loose stools-resolved  Constipation - last BM 12/2   Hypokalemia  Hip Fracture - recent, not repaired  P: Continue ATC as tolerated  Trach #6 > will need to change back to cuff-less before discharge  She will need  to be off vent to go to the [facility] O2 as needed for sats >92% Trach care per protocol  Suction PRN  Continue TF PT / OT  Limit phlebotomy as able  Reduce Bethanechol to BID > goal to wean off, pt progressing with urinary control  OOB to chair during day  Free water 200 ml Q6   Discussion - recent work up with Dr. Shaaron Adler at [facility] for her neuro decline, family would not want neuro assessment here at [facility], this can be continued as outpt or at [facility] (MRI and LP was recommended in future).  Plan to reassess Wednesday for discharge.    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  On exam, she follows some commands with coarse BS diffusely.  I reviewed CXR myself, tracheostomy is in a  good position.  Discussed with PCCM-NP.  Respiratory failure:  - Continue weaning as able  HCAP:  - Abx as ordered  - F/U on cultures  Hypoxemia:  - Titrate O2 for sat of 88-92%.  Trach status:  - Maintain current trach type and size.  Patient seen and examined, agree with above note.  I dictated the care and orders written for this patient under my direction.  Rush Farmer, [place]

## 2016-12-27 NOTE — Care Management Note (Addendum)
Case Management Note  Patient Details  Name: Judy Spears MRN: [id number] Date of Birth: July 20, 1950  Subjective/Objective: From home with spouse,  Has chronic trach dependence ( 2 years), chronic hypoxic resp failure total care. Recently diagnosed with breast cancer having undergone lumpectomy summer 2018.  Recently she has been found to have a broken right hip and is wheelchair-bound at baseline. Presents with increasing purulent sputum production and increased work of breathing. Her mental status has been slightly worse than usual.  Her saturations have dropped into the 70s at times which prompted her husband to bring her to the emergency room. Through imaging she was subsequently found to have bibasilar infiltrates suspicious for healthcare associated pneumonia and was started on broad-spectrum antiviral therapy.  She has caregivers around the clock and she has all the DME she needs at home except for oygen if she needs that at discharge, not sure yet if she will need home oxygen , she did not have oxygen previously at home.   11/29 [place], BSN - NCM received information from CSW this am that MD states spouse would like referral sent to the [facility], NCM contacted spouse and asked if it was ok for information to be sent to [place],he said yes.  NCM faxed information to [facility], tried around 5 times to the fax number [phone].  NCM called back to let them know fax is not going thru, [name] stated that someone was working on that fax machine so it is not working and to fax the info to [phone].  It did go thru on that fax number.  NCM  Called spouse back and asked if this is not approved would he be ok if we added additional [place] services for her when she goes home with [facility]) which would help when patient have problems at home the [facility] could contact MD and receive orders over the phone to assist patient, he states that would be great ,  He asked who would cover it, NCM informed him Medicare would cover.  Spouse then also stated since patient has been in [facility] before , he would also like to try for her to go back there again if she can, so we could have both rehabs looking at patient.  He states this will give her time to get better.  NCM contacted MD and he says yes to go ahead with that process. NCM also contacted Butch Penny with [facility] informed her about the [facility] discussion,  They will follow along to see if patient will go home from hospital or to [place].  12/3 1157 Tomi Bamberger RN, BSN - NCM spoke with Tammy at the [facility] informed her of the Mucus Plug and that patient was on vent Fri,Sat, Sun,today weaning on trach collar at 66.  Tammy also states that she will have to have a uncuffed trach to come to [facility], would like to watch patient a couple days, will send updates on Wed.   12/4 [place], BSN - NCM spoke with spouse, informed him that we are still working on getting updated info to [facility] but if they deny then will try ltach, he states he is familiar with [facility].  NCM will let him know as soon as find out from [facility] first.   12/5 [place], BSN - NCM spoke with Tammy from [facility] she states the referral for them  was cancel since she went back on the vent, states that it will take 3 more days to hear back from them.  Also [place] with [facility] states if [facility] has started precert they will not be able to do anything until it was denied, but since it was cancel they will go ahead and start precert for ltach for [facility].  NCM left vm for spouse to return call.   [place], BSN -NCM received call from spouse, he states he would like for the [facility] center to try first,this is his first option for her.  NCM left message for Tammy at the [facility] to go ahead with precert , per pt eval rec inpatient rehab. Per [place] with [facility] patient is at baseline and is  not a ltach candidate at this time.  12/6 [place], BSN - NCM spoke with Tammy at the [facility] at [facility] this am,  she states she sent in for precert but she will try to see if they can expedite it.  NCM called her back at 2:56 pm to see if they were able to expedite it , left vm for her to call NCM back.  NCM received call back and Tammy states they did say they would expedite this and she think we may here something today.                        Action/Plan:- Send updated information to [facility] on Wed 12/5.  Referral sent to Both [facility] and [place] at [place].- will need to let Dr. Titus Mould know outcome.  Contact at the [facility] is [place] [address] [phone].      Expected Discharge Date:                  Expected Discharge Plan:  [place]  In-House Referral:     Discharge planning Services  CM Consult  Post Acute Care Choice:    Choice offered to:     DME Arranged:    DME Agency:     HH Arranged:    [place] Agency:     Status of Service:  In process, will continue to follow  If discussed at Long Length of Stay Meetings, dates discussed:    Additional Comments:  Zenon Mayo, RN 12/27/2016, 5:24 PM

## 2016-12-27 NOTE — Progress Notes (Signed)
Physical Therapy Treatment Patient Details Name: Judy Spears MRN: [id number] DOB: 01-16-1951 Today's Date: 12/27/2016    History of Present Illness 66 yo female with acute on chronic respiratory failure from HCAP. PMHx of breast cancer in Summer 2018, Rt hip fx (which has not been repaired), [place] encephalitis. Had initial improvement and transferred to vent SDU. Developed progressive fever, increase WOB, and rash around G tube site. Transferred back to ICU.    PT Comments    Continuing work on functional mobility and activity tolerance;  Focus of session on tone reduction techniques, UE ROM, trunk control and sitting balance at EOB. Pt able to right her trunk with min assist when returning to midline from R and with max assist from L. Responded well to references to yoga and dance with trunk activities; Performed squat-pivot to pt's w/c with +2 total assist. Pt with stable VS throughout session.    Follow Up Recommendations  CIR     Equipment Recommendations  (Consider lift equipment)    Recommendations for Other Services       Precautions / Restrictions Precautions Precautions: Fall Precaution Comments: vent/trach, PEG    Mobility  Bed Mobility Overal bed mobility: Needs Assistance Bed Mobility: Rolling;Sidelying to Sit Rolling: Total assist;+2 for physical assistance Sidelying to sit: +2 for physical assistance;Total assist       General bed mobility comments: rolling/trunk rotation to reduce tone prior to sitting up, +2 total for all aspects of bed mobility.  Transfers Overall transfer level: Needs assistance Equipment used: 2 person hand held assist Transfers: Squat Pivot Transfers     Squat pivot transfers: Total assist;+2 physical assistance;+2 safety/equipment     General transfer comment: transfered bed to her w/c toward L with arm of chair lowered  Ambulation/Gait             General Gait Details: non ambulatory   Stairs             Wheelchair Mobility    Modified Rankin (Stroke Patients Only)       Balance Overall balance assessment: Needs assistance Sitting-balance support: Feet supported;No upper extremity supported Sitting balance-Leahy Scale: Poor Sitting balance - Comments: total assist initially, progressed to mod to min briefly after activating trunk with lateral flexion onto each elbow Postural control: Posterior lean;Left lateral lean                                  Cognition Arousal/Alertness: Awake/alert Behavior During Therapy: WFL for tasks assessed/performed;Restless Overall Cognitive Status: Difficult to assess                                 General Comments: followed one step commands, with weight bearing on each elbow and return to midline in sitting, slow response speed with yes/no      Exercises General Exercises - Upper Extremity Shoulder Flexion: PROM;10 reps;Supine;Both Elbow Extension: AAROM;Both;10 reps;Supine Other Exercises Other Exercises: facilitated scapular adduction and depression as pt was sitting at EOB    General Comments        Pertinent Vitals/Pain Pain Assessment: Faces Faces Pain Scale: Hurts little more Pain Location: uncertain  Pain Descriptors / Indicators: Grimacing;Guarding Pain Intervention(s): Monitored during session    Home Living                      Prior  Function            PT Goals (current goals can now be found in the care plan section) Acute Rehab PT Goals Patient Stated Goal: to regain function  PT Goal Formulation: With family Time For Goal Achievement: 12/27/16 Potential to Achieve Goals: Fair Progress towards PT goals: Progressing toward goals    Frequency    Min 2X/week      PT Plan Current plan remains appropriate    Co-evaluation PT/OT/SLP Co-Evaluation/Treatment: Yes Reason for Co-Treatment: Complexity of the patient's impairments (multi-system involvement) PT  goals addressed during session: Mobility/safety with mobility OT goals addressed during session: Strengthening/ROM      AM-PAC PT "6 Clicks" Daily Activity  Outcome Measure  Difficulty turning over in bed (including adjusting bedclothes, sheets and blankets)?: Unable Difficulty moving from lying on back to sitting on the side of the bed? : Unable Difficulty sitting down on and standing up from a chair with arms (e.g., wheelchair, bedside commode, etc,.)?: Unable Help needed moving to and from a bed to chair (including a wheelchair)?: Total Help needed walking in hospital room?: Total Help needed climbing 3-5 steps with a railing? : Total 6 Click Score: 6    End of Session Equipment Utilized During Treatment: Gait belt Activity Tolerance: Patient tolerated treatment well Patient left: in chair Nurse Communication: Mobility status;Need for lift equipment PT Visit Diagnosis: Other abnormalities of gait and mobility (R26.89);Muscle weakness (generalized) (M62.81) Pain - Right/Left: Right Pain - part of body: Hip     Time: 9924-2683 PT Time Calculation (min) (ACUTE ONLY): 43 min  Charges:  $Therapeutic Activity: 23-37 mins                    G Codes:       Roney Marion, PT  Acute Rehabilitation Services Pager [phone] Office [phone]    Colletta [place] 12/27/2016, 4:43 PM

## 2016-12-28 ENCOUNTER — Telehealth: Payer: Self-pay | Admitting: Physical Medicine & Rehabilitation

## 2016-12-28 ENCOUNTER — Encounter: Payer: Medicare Other | Admitting: Physical Medicine & Rehabilitation

## 2016-12-28 DIAGNOSIS — E871 Hypo-osmolality and hyponatremia: Secondary | ICD-10-CM

## 2016-12-28 LAB — GLUCOSE, CAPILLARY
Glucose-Capillary: 114 mg/dL — ABNORMAL HIGH (ref 65–99)
Glucose-Capillary: 115 mg/dL — ABNORMAL HIGH (ref 65–99)
Glucose-Capillary: 122 mg/dL — ABNORMAL HIGH (ref 65–99)
Glucose-Capillary: 168 mg/dL — ABNORMAL HIGH (ref 65–99)
Glucose-Capillary: 75 mg/dL (ref 65–99)
Glucose-Capillary: 98 mg/dL (ref 65–99)

## 2016-12-28 LAB — CBC
HEMATOCRIT: 43.2 % (ref 36.0–46.0)
Hemoglobin: 13.7 g/dL (ref 12.0–15.0)
MCH: 28.8 pg (ref 26.0–34.0)
MCHC: 31.7 g/dL (ref 30.0–36.0)
MCV: 90.9 fL (ref 78.0–100.0)
Platelets: 282 10*3/uL (ref 150–400)
RBC: 4.75 MIL/uL (ref 3.87–5.11)
RDW: 16.3 % — AB (ref 11.5–15.5)
WBC: 7.2 10*3/uL (ref 4.0–10.5)

## 2016-12-28 LAB — BASIC METABOLIC PANEL
ANION GAP: 10 (ref 5–15)
Anion gap: 11 (ref 5–15)
BUN: 20 mg/dL (ref 6–20)
BUN: 19 mg/dL (ref 6–20)
CHLORIDE: 93 mmol/L — AB (ref 101–111)
CO2: 29 mmol/L (ref 22–32)
CO2: 28 mmol/L (ref 22–32)
CREATININE: 0.33 mg/dL — AB (ref 0.44–1.00)
Calcium: 9.1 mg/dL (ref 8.9–10.3)
Calcium: 8.8 mg/dL — ABNORMAL LOW (ref 8.9–10.3)
Chloride: 97 mmol/L — ABNORMAL LOW (ref 101–111)
Creatinine, Ser: 0.34 mg/dL — ABNORMAL LOW (ref 0.44–1.00)
GFR calc Af Amer: >60 mL/min (ref >60)
GFR calc Af Amer: >60 mL/min (ref >60)
GFR calc non Af Amer: >60 mL/min (ref >60)
GLUCOSE: 96 mg/dL (ref 65–99)
GLUCOSE: 118 mg/dL — AB (ref 65–99)
POTASSIUM: 4 mmol/L (ref 3.5–5.1)
POTASSIUM: 3.7 mmol/L (ref 3.5–5.1)
Sodium: 133 mmol/L — ABNORMAL LOW (ref 135–145)
Sodium: 135 mmol/L (ref 135–145)

## 2016-12-28 MED ORDER — FREE WATER
200.0000 mL | Freq: Three times a day (TID) | Status: DC
  Administered 2016-12-28 – 2017-01-02 (×15): 200 mL

## 2016-12-28 NOTE — Telephone Encounter (Signed)
Patients husband Judy Spears [phone]) called office and asked to be switched to Judy Spears care from Judy Spears.  He states he feels Judy Spears " gave up" on wife.  I told him Judy Spears has never given up on anyone, and I also advised that Physicians do not see other MD patients within clinic as a rule.  But would ask and see if Judy Spears could make recommendation to other.  Spoke with Judy Spears - he does not want to change policy of practice.  He recommends to Judy Spears that he try Judy Spears or Judy Spears for a Physiatrist that handles Neuro - there is no one else locally he would recommend for Neuro - as they all specialize more in Ortho/Sports med rehab.  I called to advised husband.  He is displeased with the answer - and plans to contact Judy Spears for  Resolution.  I told him I would advise him he was not pleased with her care at [facility] either- she was not thriving and injured and no one responded appropriately to the concern.

## 2016-12-28 NOTE — Progress Notes (Signed)
[facility]   Physician Requesting Consult:Peter Messick, M.D. 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 Known chronic hypoxic respiratory failure. Recently diagnosed with breast cancer having undergone lumpectomy summer 2018. Lives at home with family and does not at times as well as make small attempts to verbalize. Recently she has been found to have a broken right hip and is wheelchair-bound at baseline. Presents with 1 day history of increasing purulent sputum production and increased work of breathing. Her mental status has been slightly worse than usual. No subjective fever, chills, or sweats. No recent sick contacts that the husband reports. Patient has been more restless at home recently. Her saturations have dropped into the 70s at times today prompting her husband to bring her to the emergency room. Through imaging she was subsequently found to have bibasilar infiltrates suspicious for healthcare associated pneumonia and was started on broad-spectrum antiviral therapy.  Subjective:  Family at bedside.  No acute events overnight.  Remains off vent on ATC 21%.       FiO2 (%):  [21 %-40 %] 21 %  Temp:  [98.4 F (36.9 C)-98.9 F (37.2 C)] 98.7 F (37.1 C) (12/04 1216) Pulse Rate:  [70-82] 82 (12/04 1216) Resp:  [18-35] 26 (12/04 1216) BP: (131-167)/(68-97) 149/86 (12/04 1216) SpO2:  [95 %-100 %] 96 % (12/04 1216) FiO2 (%):  [21 %-40 %] 21 % (12/04 1207) Weight:  [103 lb 13.4 oz (47.1 kg)] 103 lb 13.4 oz (47.1 kg) (12/04 0223)   General: chronically ill appearing female in NAD, lying in bed HEENT: MM pink/moist, no jvd, trach midline c/d/i, clear secretions from trach Neuro: Awake, alert, nods appropriately, smiles, spastic movement of extremities  CV: s1s2 rrr, no m/r/g PULM: even/non-labored, lungs bilaterally clear  UV:OZDG, non-tender, bsx4 active   Extremities: warm/dry, no edema  Skin: no rashes or lesions  CBC Latest Ref Rng & Units 12/28/2016 12/21/2016 12/19/2016  WBC 4.0 - 10.5 K/uL 7.2 5.3 5.5  Hemoglobin 12.0 - 15.0 g/dL 13.7 12.1 10.9(L)  Hematocrit 36.0 - 46.0 % 43.2 37.8 35.1(L)  Platelets 150 - 400 K/uL 282 318 312    BMP Latest Ref Rng & Units 12/28/2016 12/27/2016 12/26/2016  Glucose 65 - 99 mg/dL 96 99 136(H)  BUN 6 - 20 mg/dL 20 23(H) 19  Creatinine 0.44 - 1.00 mg/dL 0.33(L) 0.31(L) 0.31(L)  BUN/Creat Ratio 12 - 28 - - -  Sodium 135 - 145 mmol/L 133(L) 139 140  Potassium 3.5 - 5.1 mmol/L 4.0 3.8 3.4(L)  Chloride 101 - 111 mmol/L 93(L) 101 102  CO2 22 - 32 mmol/L '29 30 29  '$ Calcium 8.9 - 10.3 mg/dL 9.1 9.2 9.2    Hepatic Function Latest Ref Rng & Units 12/20/2016 12/19/2016 12/17/2016  Total Protein 6.5 - 8.1 g/dL - - -  Albumin 3.5 - 5.0 g/dL 2.9(L) 2.2(L) 2.9(L)  AST 15 - 41 U/L - - -  ALT 14 - 54 U/L - - -  Alk Phosphatase 38 - 126 U/L - - -  Total Bilirubin 0.3 - 1.2 mg/dL - - -  Bilirubin, Direct 0.0 - 0.3 mg/dL - - -   IMAGING/STUDIES: PORT CXR 11/11: Previously reviewed by me. And reticular opacities predominantly in the lung bases with some areas of alveolar filling suggestive of a pneumonitis. PORT CXR 11/19: Previously reviewed by me. Questionable new right lower lung zone opacity. No pleural effusion appreciated. RUQ U/S  11/19:Unremarkable right upper quadrant ultrasound. CT CHEST W/O 11/21:  Previously reviewed by me. Focal dependent opacity posterior segment right lower lobe. Mucous plugging within left lower lobe noted. No pleural effusion. No pneumothorax. PORT CXR 11/22:  Previously reviewed by me. Questionable progression of left basilar opacity. Low lung volumes today. No new focal opacity appreciated. Still retains good visualization of left hemidiaphragm. Tracheostomy in place.  MICROBIOLOGY: MRSA PCR 11/11 >>  Positive  Blood Cultures x2 11/11 >>  Negative  Tracheal Aspirate Culture  11/11 >>  MRSA Urine Streptococcal Antigen 11/11 >>  Negative Urine Legionella Antigen 11/11 >>  Negative  Urine Culture 11/18 >>  Negative  Blood Cultures x2 11/18 >> neg Tracheal Aspirate Culture 11/19 >>  MRSA   ANTIBIOTICS: Merrem 11/11 - 11/13 Diflucan 11/18- 11/20 Nystatin Powder 11/20 >> Vancomycin 11/11 - 11/17; 11/18 >> 11/24 Fortaz 11/18 >> 11/24  LINES/TUBES: Lurline Idol #6 (ENT) >>>  PEG >>> Foley >>>11/26 PIV  SIGNIFICANT EVENTS: 11/11 - Admit 11/12 - Worsening work of breathing & desat >> trach changed to cuffed #4 & placed on full vent support 11/16 - Transfer to vent SDU bed 11/17 - Transferred back to ICU w/ fever 11/21 - Trach up-sized by ENT to #6 cuffed  11/22 - Tolerated limited PS weaning 11/24 - To SDU  11/25 - ATC x 8-9 hours  11/26 - ATC 40% 11/30 - currently on pressure control ventilation with pressure control of 12. 11/29 - episode of mucus plugging, back on vent 12/02 - weaned 7 hours on ATC, rested on vent overnight 12/03 - ATC   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 Admitted with acute on chronic hypoxic respiratory failure secondary to pneumonia. Clinically improving. Tolerating limited pressure support weaning.  A: Acute on chronic hypoxic respiratory failure requiring tracheostomy and mechanical ventilatory support.   Chronic mucous plugging Severe malnutrition  History of encephalitis - able to follow commands / communicate Urinary Retention Abnormal renal fxn that resolved, error in labs? Mild loose stools-resolved  Constipation - last BM 12/2   Hypokalemia  Hyponatremia  Hip Fracture - recent, not repaired  P: ATC as tolerated  Pulmonary hygiene - IS, mobilize as able  #6 Shiley trach > will need to change back to cuffless trach before discharge (ENT) She will need to remain off the vent to be considered a candidate for the [facility] care per protocol  O2 as needed to support sats >  92% Suction PRN  Continue TF  PT / OT / SLP efforts  Limit phlebotomy as able  Reduce free water to 200 ml to Q8  Continue Bethanechol to BID > goal to wean off as pt progresses with urinary control  OOB to chair during the day   Discussion - recent work up with Dr. Shaaron Adler at [facility] for her neuro decline, family would not want neuro assessment here at [facility], this can be continued as outpt or at [facility] (MRI and LP was recommended in future).  Plan to reassess Wednesday for possible discharge.   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  On exam, lungs are clear.  I reviewed CXR myself, trach is in good position.  Discussed with PCCM-NP.  Respiratory failure:  -  Continue weaning efforts as ordered.  - TC 24 hours so far  HCAP:  - Abx as ordered  - F/U on cutlure  Hyponatremia:  - Decrease free water from q6 to q8.  Hypoxemia:  - Titrate O2 for sat of 88-92%  Trach status:  - Maintain current type and size of trach.  Patient seen and examined, agree with above note.  I dictated the care and orders written for this patient under my direction.  Rush Farmer, [place]

## 2016-12-29 DIAGNOSIS — G0490 Encephalitis and encephalomyelitis, unspecified: Secondary | ICD-10-CM

## 2016-12-29 LAB — GLUCOSE, CAPILLARY
GLUCOSE-CAPILLARY: 114 mg/dL — AB (ref 65–99)
Glucose-Capillary: 138 mg/dL — ABNORMAL HIGH (ref 65–99)

## 2016-12-29 LAB — CBC
HEMATOCRIT: 41.4 % (ref 36.0–46.0)
Hemoglobin: 13.3 g/dL (ref 12.0–15.0)
MCH: 29.1 pg (ref 26.0–34.0)
MCHC: 32.1 g/dL (ref 30.0–36.0)
MCV: 90.6 fL (ref 78.0–100.0)
PLATELETS: 285 10*3/uL (ref 150–400)
RBC: 4.57 MIL/uL (ref 3.87–5.11)
RDW: 16.4 % — AB (ref 11.5–15.5)
WBC: 8.2 10*3/uL (ref 4.0–10.5)

## 2016-12-29 NOTE — Progress Notes (Signed)
Physical Therapy Treatment Patient Details Name: Judy Spears MRN: [id number] DOB: 11/17/1950 Today's Date: 12/29/2016    History of Present Illness 66 yo female with acute on chronic respiratory failure from HCAP. PMHx of breast cancer in Summer 2018, Rt hip fx (which has not been repaired), [place] encephalitis. Had initial improvement and transferred to vent SDU. Developed progressive fever, increase WOB, and rash around G tube site. Transferred back to ICU.    PT Comments    Pt admitted with above diagnosis. Pt currently with functional limitations due to balance and endurance deficits. Pt was able to transfer to wheelchair with max to total assist stand pivot with use of pad and knees blocked.  Pt positioned well in chair.  Will continue acute PT.  Pt will benefit from skilled PT to increase their independence and safety with mobility to allow discharge to the venue listed below.     Follow Up Recommendations  CIR     Equipment Recommendations  (Consider lift equipment)    Recommendations for Other Services       Precautions / Restrictions Precautions Precautions: Fall Precaution Comments: vent/trach, PEG Restrictions Weight Bearing Restrictions: No    Mobility  Bed Mobility Overal bed mobility: Needs Assistance Bed Mobility: Rolling;Sidelying to Sit Rolling: Total assist;+2 for physical assistance Sidelying to sit: +2 for physical assistance;Max assist;Total assist       General bed mobility comments: rolling/trunk rotation to reduce tone prior to sitting up, +2 total for all aspects of bed mobility.  Transfers Overall transfer level: Needs assistance   Transfers: Stand Pivot Transfers Sit to Stand: Max assist(with pad) Stand pivot transfers: Max assist;+2 safety/equipment       General transfer comment: transfered bed to her w/c toward L with arm of chair removed.  Pt was able to stand on feet (put her shoes on) for pivot transfer with use of  pad and therapist assisting but pt did weight bear and stand on feet to pivot.  Assist to move pt back in chair appropriately. Also used pad to position pt so she was in midline.  Caregiver happy with pt position in chair.    Ambulation/Gait             General Gait Details: non ambulatory   Stairs            Wheelchair Mobility    Modified Rankin (Stroke Patients Only)       Balance Overall balance assessment: Needs assistance Sitting-balance support: Feet supported;No upper extremity supported Sitting balance-Leahy Scale: Poor Sitting balance - Comments: total assist initially, progressed to mod to min briefly after activating trunk with lateral flexion onto each elbow.  At times, pt kicks LEs out and have to cue to "put feet on floor" . Postural control: Posterior lean;Left lateral lean Standing balance support: No upper extremity supported;During functional activity Standing balance-Leahy Scale: Poor Standing balance comment: Pt needed max assist to stand with use of pad for forward lean.                             Cognition Arousal/Alertness: Awake/alert Behavior During Therapy: WFL for tasks assessed/performed;Restless Overall Cognitive Status: Difficult to assess                                 General Comments: followed one step commands, with weight bearing on each elbow and return  to midline in sitting, slow response speed with yes/no      Exercises      General Comments        Pertinent Vitals/Pain Pain Assessment: Faces Faces Pain Scale: Hurts little more Pain Location: uncertain  Pain Descriptors / Indicators: Grimacing;Guarding Pain Intervention(s): Limited activity within patient's tolerance;Monitored during session;Repositioned   VSS Home Living                      Prior Function            PT Goals (current goals can now be found in the care plan section) Progress towards PT goals: Progressing  toward goals    Frequency    Min 2X/week      PT Plan Current plan remains appropriate    Co-evaluation              AM-PAC PT "6 Clicks" Daily Activity  Outcome Measure  Difficulty turning over in bed (including adjusting bedclothes, sheets and blankets)?: Unable Difficulty moving from lying on back to sitting on the side of the bed? : Unable Difficulty sitting down on and standing up from a chair with arms (e.g., wheelchair, bedside commode, etc,.)?: Unable Help needed moving to and from a bed to chair (including a wheelchair)?: Total Help needed walking in hospital room?: Total Help needed climbing 3-5 steps with a railing? : Total 6 Click Score: 6    End of Session Equipment Utilized During Treatment: Gait belt Activity Tolerance: Patient tolerated treatment well Patient left: in chair;with call bell/phone within reach;with family/visitor present Nurse Communication: Mobility status;Need for lift equipment PT Visit Diagnosis: Other abnormalities of gait and mobility (R26.89);Muscle weakness (generalized) (M62.81) Pain - Right/Left: Right Pain - part of body: Hip     Time: 3546-5681 PT Time Calculation (min) (ACUTE ONLY): 33 min  Charges:  $Therapeutic Activity: 8-22 mins $Wheel Chair Management: 8-22 mins                    G Codes:       [name],PT Acute Rehabilitation [phone] (pager)    [name] 12/29/2016, 12:24 PM

## 2016-12-29 NOTE — Progress Notes (Signed)
Pt in chair. Will ask RN to call when back in bed but right now patient requesting to stay in chair.

## 2016-12-29 NOTE — Progress Notes (Signed)
Occupational Therapy Treatment Patient Details Name: Judy Spears MRN: [id number] DOB: 10-06-1950 Today's Date: 12/29/2016    History of present illness 66 yo female with acute on chronic respiratory failure from HCAP. PMHx of breast cancer in Summer 2018, Rt hip fx (which has not been repaired), [place] encephalitis. Had initial improvement and transferred to vent SDU. Developed progressive fever, increase WOB, and rash around G tube site. Transferred back to ICU.   OT comments  Pt sitting up in w/c.  Pt with obvious discomfort which increased once Rt footrest removed.  Pt indicated Rt foot and hip hurt.   Rt LE elevated on pillows, pt reclined in w/c, and worked on head/neck positioning as well as ROM.   Did not progress pt to sitting/balance activities due to increase pain/discomfort.   She will benefit from post acute rehab.  Will follow.   Follow Up Recommendations  Supervision/Assistance - 24 hour    Equipment Recommendations  None recommended by OT    Recommendations for Other Services      Precautions / Restrictions Precautions Precautions: Fall Precaution Comments: trach, PEG  Restrictions Weight Bearing Restrictions: No       Mobility Bed Mobility Overal bed mobility: Needs Assistance Bed Mobility: Rolling;Sidelying to Sit Rolling: Total assist;+2 for physical assistance Sidelying to sit: +2 for physical assistance;Max assist;Total assist       General bed mobility comments: rolling/trunk rotation to reduce tone prior to sitting up, +2 total for all aspects of bed mobility.  Transfers Overall transfer level: Needs assistance   Transfers: Stand Pivot Transfers Sit to Stand: Max assist(with pad) Stand pivot transfers: Max assist;+2 safety/equipment       General transfer comment: transfered bed to her w/c toward L with arm of chair removed.  Pt was able to stand on feet (put her shoes on) for pivot transfer with use of pad and therapist  assisting but pt did weight bear and stand on feet to pivot.  Assist to move pt back in chair appropriately. Also used pad to position pt so she was in midline.  Caregiver happy with pt position in chair.      Balance Overall balance assessment: Needs assistance Sitting-balance support: Feet supported;No upper extremity supported Sitting balance-Leahy Scale: Poor Sitting balance - Comments: total assist initially, progressed to mod to min briefly after activating trunk with lateral flexion onto each elbow.  At times, pt kicks LEs out and have to cue to "put feet on floor" . Postural control: Posterior lean;Left lateral lean Standing balance support: No upper extremity supported;During functional activity Standing balance-Leahy Scale: Poor Standing balance comment: Pt needed max assist to stand with use of pad for forward lean.                            ADL either performed or assessed with clinical judgement   ADL Overall ADL's : Needs assistance/impaired     Grooming: Wash/dry face;Maximal assistance;Sitting                                       Vision       Perception     Praxis      Cognition Arousal/Alertness: Awake/alert Behavior During Therapy: Restless Overall Cognitive Status: Difficult to assess  General Comments: Pt follows one step commands ~75% of the time.  She will mouth words, but difficult to discern what she is trying to say         Exercises Other Exercises Other Exercises: Pt sitting up in the w/c attempted to move pt to EOC to work on trunk control and balance; however, pt became very restless when foot rest removed from [place].  Pt indicating pain at Rt foot, then Rt hip.   Shoe removed - no evidence of redness on foot.  Pt reclined in w/c, but continues to indicate pain with knee flexed.   Rt LE elevated on two pillows (as leg rests do not elevate).   Pt calmed and indicated less  pain.   Worked on ROM of head/neck, as well as positioning head/neck.   PROM Lt shoulder and hand.  Pt falling asleep at that time.     Shoulder Instructions       General Comments      Pertinent Vitals/ Pain       Pain Assessment: Faces Faces Pain Scale: Hurts whole lot Pain Location: Rt LE  Pain Descriptors / Indicators: Spasm;Restless;Grimacing Pain Intervention(s): Monitored during session;Repositioned;Limited activity within patient's tolerance  Home Living                                          Prior Functioning/Environment              Frequency  Min 2X/week        Progress Toward Goals  OT Goals(current goals can now be found in the care plan section)  Progress towards OT goals: Not progressing toward goals - comment(due to increased pain Rt LE today )     Plan Discharge plan remains appropriate    Co-evaluation                 AM-PAC PT "6 Clicks" Daily Activity     Outcome Measure   Help from another person eating meals?: Total Help from another person taking care of personal grooming?: Total Help from another person toileting, which includes using toliet, bedpan, or urinal?: Total Help from another person bathing (including washing, rinsing, drying)?: Total Help from another person to put on and taking off regular upper body clothing?: Total Help from another person to put on and taking off regular lower body clothing?: Total 6 Click Score: 6    End of Session Equipment Utilized During Treatment: Oxygen  OT Visit Diagnosis: Muscle weakness (generalized) (M62.81)   Activity Tolerance Patient limited by pain   Patient Left in chair;with call bell/phone within reach;with family/visitor present;with nursing/sitter in room   Nurse Communication Mobility status        Time: 4163-8453 OT Time Calculation (min): 22 min  Charges: OT General Charges $OT Visit: 1 Visit OT Treatments $Neuromuscular Re-education: 8-22  mins  [facility], OTR/L [phone]    Lucille Passy M 12/29/2016, 2:09 PM

## 2016-12-29 NOTE — Progress Notes (Signed)
[facility]   Physician Requesting Consult:Peter Messick, M.D. 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 Known chronic hypoxic respiratory failure. Recently diagnosed with breast cancer having undergone lumpectomy summer 2018. Lives at home with family and does not at times as well as make small attempts to verbalize. Recently she has been found to have a broken right hip and is wheelchair-bound at baseline. Presents with 1 day history of increasing purulent sputum production and increased work of breathing. Her mental status has been slightly worse than usual. No subjective fever, chills, or sweats. No recent sick contacts that the husband reports. Patient has been more restless at home recently. Her saturations have dropped into the 70s at times today prompting her husband to bring her to the emergency room. Through imaging she was subsequently found to have bibasilar infiltrates suspicious for healthcare associated pneumonia and was started on broad-spectrum antiviral therapy.  Subjective:     FiO2 (%):  [21 %] 21 %  Temp:  [97.7 F (36.5 C)-99.4 F (37.4 C)] 97.7 F (36.5 C) (12/05 1228) Pulse Rate:  [75-87] 85 (12/05 1228) Resp:  [21-36] 33 (12/05 1228) BP: (120-190)/(77-122) 131/91 (12/05 1228) SpO2:  [94 %-100 %] 95 % (12/05 1228) FiO2 (%):  [21 %] 21 % (12/05 1202) Weight:  [97 lb 0 oz (44 kg)] 97 lb 0 oz (44 kg) (12/05 0233)   General:  Chronically ill appearing female, up to wheelchair HEENT: MM pink/moist, no jvd, trach midline c/d/i Neuro: opens eyes, nods yes/no, chronic spasticity of LE's CV: s1s2 rrr, no m/r/g PULM: even/non-labored, lungs bilaterally clear ZO:XWRU, non-tender, bsx4 active  Extremities: warm/dry, no edema  Skin: no rashes or lesions   CBC Latest Ref Rng & Units 12/29/2016 12/28/2016 12/21/2016  WBC 4.0 - 10.5 K/uL 8.2 7.2 5.3   Hemoglobin 12.0 - 15.0 g/dL 13.3 13.7 12.1  Hematocrit 36.0 - 46.0 % 41.4 43.2 37.8  Platelets 150 - 400 K/uL 285 282 318    BMP Latest Ref Rng & Units 12/28/2016 12/28/2016 12/27/2016  Glucose 65 - 99 mg/dL 118(H) 96 99  BUN 6 - 20 mg/dL 19 20 23(H)  Creatinine 0.44 - 1.00 mg/dL 0.34(L) 0.33(L) 0.31(L)  BUN/Creat Ratio 12 - 28 - - -  Sodium 135 - 145 mmol/L 135 133(L) 139  Potassium 3.5 - 5.1 mmol/L 3.7 4.0 3.8  Chloride 101 - 111 mmol/L 97(L) 93(L) 101  CO2 22 - 32 mmol/L '28 29 30  '$ Calcium 8.9 - 10.3 mg/dL 8.8(L) 9.1 9.2    Hepatic Function Latest Ref Rng & Units 12/20/2016 12/19/2016 12/17/2016  Total Protein 6.5 - 8.1 g/dL - - -  Albumin 3.5 - 5.0 g/dL 2.9(L) 2.2(L) 2.9(L)  AST 15 - 41 U/L - - -  ALT 14 - 54 U/L - - -  Alk Phosphatase 38 - 126 U/L - - -  Total Bilirubin 0.3 - 1.2 mg/dL - - -  Bilirubin, Direct 0.0 - 0.3 mg/dL - - -   IMAGING/STUDIES: PORT CXR 11/11: Previously reviewed by me. And reticular opacities predominantly in the lung bases with some areas of alveolar filling suggestive of a pneumonitis. PORT CXR 11/19: Previously reviewed by me. Questionable new right lower lung zone opacity. No pleural effusion appreciated. RUQ U/S 11/19:Unremarkable right upper quadrant ultrasound. CT CHEST W/O 11/21:  Previously reviewed by me. Focal dependent opacity posterior segment right lower lobe. Mucous plugging within left lower  lobe noted. No pleural effusion. No pneumothorax. PORT CXR 11/22:  Previously reviewed by me. Questionable progression of left basilar opacity. Low lung volumes today. No new focal opacity appreciated. Still retains good visualization of left hemidiaphragm. Tracheostomy in place.  MICROBIOLOGY: MRSA PCR 11/11 >>  Positive  Blood Cultures x2 11/11 >>  Negative  Tracheal Aspirate Culture 11/11 >>  MRSA Urine Streptococcal Antigen 11/11 >>  Negative Urine Legionella Antigen 11/11 >>  Negative  Urine Culture 11/18 >>  Negative  Blood Cultures x2  11/18 >> neg Tracheal Aspirate Culture 11/19 >>  MRSA   ANTIBIOTICS: Merrem 11/11 - 11/13 Diflucan 11/18- 11/20 Nystatin Powder 11/20 >> Vancomycin 11/11 - 11/17; 11/18 >> 11/24 Fortaz 11/18 >> 11/24  LINES/TUBES: Lurline Idol #6 (ENT) >>>  PEG >>> Foley >>>11/26 PIV  SIGNIFICANT EVENTS: 11/11 - Admit 11/12 - Worsening work of breathing & desat >> trach changed to cuffed #4 & placed on full vent support 11/16 - Transfer to vent SDU bed 11/17 - Transferred back to ICU w/ fever 11/21 - Trach up-sized by ENT to #6 cuffed  11/22 - Tolerated limited PS weaning 11/24 - To SDU  11/25 - ATC x 8-9 hours  11/26 - ATC 40% 11/30 - currently on pressure control ventilation with pressure control of 12. 11/29 - episode of mucus plugging, back on vent 12/02 - weaned 7 hours on ATC, rested on vent overnight 12/03 - ATC  12/05 - Remains on 21% ATC  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 Admitted with acute on chronic hypoxic respiratory failure secondary to pneumonia. Clinically improving. Tolerating limited pressure support weaning.  A: Acute on chronic hypoxic respiratory failure requiring tracheostomy and mechanical ventilatory support.   Chronic mucous plugging Severe malnutrition  History of encephalitis - able to follow commands / communicate Urinary Retention Abnormal renal fxn that resolved, error in labs? Mild loose stools-resolved  Constipation - last BM 12/2   Hypokalemia  Hyponatremia  Hip Fracture - recent, not repaired  P: ATC as tolerated, off x48 hours off since 12/3 Mobilize, OOB to chair #6 cuffed Shiley trach > will potentially need trach change before discharge to cuffless pending disposition  Intermittent CXR  O2 as needed to support sats > 92% Continue weaning protocol  Suction PRN  Continue TF per Nutrition  PT / OT / SLP efforts  Free water 200 ml Q8  Continue valacyclovir  Primidone QID Florastor BID Continue Bethanechol BID > goal  to wean off as pt progresses with urinary control    Discussion - recent work up with Dr. Shaaron Adler at [facility] for her neuro decline, family would not want neuro assessment here at [facility], this can be continued as outpt or at [facility] (MRI and LP was recommended in future).  [facility] referral was discontinued as she went back on vent.  Case Manager reviewing for discharge to [place].    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  On exam, lungs are clear.  I reviewed CXR myself, trach is in good position.  Discussed with CSW and PCCM-NP.  Respiratory failure:             - Continue TC with humidified air             -  Off vent since 12/1.  HCAP:             - Course complete  Encephalitis:             - Continue valcyclovir  Trach status:             - Maintain current trach kind and size.             - Will need trach changed to a cuffless before she leaves by ENT  Patient seen and examined, agree with above note.  I dictated the care and orders written for this patient under my direction.  Rush Farmer, [place]

## 2016-12-29 NOTE — Plan of Care (Signed)
Pt progressing well in all areas, pt and family/care givers calm and cooperative with plan of care.

## 2016-12-29 NOTE — Progress Notes (Signed)
SLP Cancellation Note  Patient Details Name: Judy [facility] MRN: [id number] DOB: [birth year]   Cancelled treatment:       Reason Eval/Treat Not Completed: Other (comment) Pt currently with nursing - getting cleaned and transferred back to bed per family request. Note that she still has a #6 cuffed trach, but that she may be changed to a cuffless still before d/c. SLP will continue to follow along.   Germain Osgood 12/29/2016, 4:17 PM  Germain Osgood, M.A. CCC-SLP [phone]

## 2016-12-29 NOTE — Care Management Important Message (Signed)
Important Message  Patient Details  Name: Judy Spears MRN: [id number] Date of Birth: 08-11-1950   Medicare Important Message Given:  Yes    Zenon Mayo, RN 12/29/2016, 5:50 PM

## 2016-12-29 NOTE — Progress Notes (Signed)
Pt very uncomfortable in wheel chair and will not let me suction even after visitor and myself encouraging the need for suctioning. Visitor asked to come back later for CPT in bed when patient back in bed and more comfortable. Pt coughed up but secretions remain in trach and patient refuses suctioning of mouth or trach.

## 2016-12-29 NOTE — Progress Notes (Signed)
Nutrition Follow Up  DOCUMENTATION CODES:   Underweight  INTERVENTION:    Continue Osmolite 1.5 at 90 ml/h (2160 ml per day)  Continue Pro-stat 30 ml BID  Provides 3440 kcal, 165 gm protein, 1646 ml free water daily  NUTRITION DIAGNOSIS:   Increased nutrient needs related to chronic illness, wound healing as evidenced by estimated needs, ongoing  GOAL:   Patient will meet greater than or equal to 90% of their needs, met  MONITOR:   TF tolerance, Vent status, Weight trends, Skin, I & O's  ASSESSMENT:   66 yo female with PMH of chronic hypoxic respiratory failure, trach & PEG, [place] encephalitis, breast CA, recently found to have R hip fx who was admitted on 11/11 with increased WOB related to PNA.   Current TF regimen: Osmolite 1.5 at goal rate of 90 ml/hr with Prostat 30 ml BID via PEG tube. Tolerating well. CCM note reviewed. On 12/2 pt weaned 7 hours on ATC; rested on vent overnight. Now on 21% ATC. Free water flushes at 200 ml every 8 hours.  [facility] continuing to follow for readiness to participate in PMV trials. Medications include Florastor (probiotic), Vitamin C and Vitamin D. Labs reviewed. CBG's [phone].  Diet Order:  Diet NPO time specified  EDUCATION NEEDS:   No education needs have been identified at this time  Skin:  Skin Assessment: Skin Integrity Issues: Skin Integrity Issues:: Stage II Stage II: R hip  Last BM:  12/4   Intake/Output Summary (Last 24 hours) at 12/29/2016 1559 Last data filed at 12/29/2016 0600 Gross per 24 hour  Intake 1870 ml  Output 2000 ml  Net -130 ml   Height:   Ht Readings from Last 1 Encounters:  12/24/16 '5\' 2"'$  (1.575 m)   Weight:   Wt Readings from Last 1 Encounters:  12/29/16 97 lb 0 oz (44 kg)  Admit wt         98 lb (44.5 kg)  Ideal Body Weight:  50 kg  BMI:  Body mass index is 17.74 kg/m.  Estimated Nutritional Needs:   Kcal:  >2000  Protein:  >80 gm  Fluid:  >2 L  Arthur Holms,  RD, LDN Pager #: [phone] After-Hours Pager #: [phone]

## 2016-12-30 LAB — GLUCOSE, CAPILLARY
GLUCOSE-CAPILLARY: 112 mg/dL — AB (ref 65–99)
GLUCOSE-CAPILLARY: 106 mg/dL — AB (ref 65–99)
GLUCOSE-CAPILLARY: 122 mg/dL — AB (ref 65–99)
GLUCOSE-CAPILLARY: 158 mg/dL — AB (ref 65–99)
Glucose-Capillary: 113 mg/dL — ABNORMAL HIGH (ref 65–99)
Glucose-Capillary: 136 mg/dL — ABNORMAL HIGH (ref 65–99)

## 2016-12-30 NOTE — Progress Notes (Signed)
CPT not performed at this time due to up in chair.

## 2016-12-30 NOTE — Progress Notes (Signed)
[facility]   Physician Requesting Consult:Peter Messick, M.D. 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 Known chronic hypoxic respiratory failure. Recently diagnosed with breast cancer having undergone lumpectomy summer 2018. Lives at home with family and does not at times as well as make small attempts to verbalize. Recently she has been found to have a broken right hip and is wheelchair-bound at baseline. Presents with 1 day history of increasing purulent sputum production and increased work of breathing. Her mental status has been slightly worse than usual. No subjective fever, chills, or sweats. No recent sick contacts that the husband reports. Patient has been more restless at home recently. Her saturations have dropped into the 70s at times today prompting her husband to bring her to the emergency room. Through imaging she was subsequently found to have bibasilar infiltrates suspicious for healthcare associated pneumonia and was started on broad-spectrum antiviral therapy.  Subjective:  Caregiver requesting to get up out of bed everyday.  Hoping PT can come one day & OT the next.  No acute events overnight.  Remains on ATC.   FiO2 (%):  [21 %] 21 %  Temp:  [97.6 F (36.4 C)-99.3 F (37.4 C)] 99.3 F (37.4 C) (12/06 0805) Pulse Rate:  [77-87] 85 (12/06 0805) Resp:  [22-33] 32 (12/06 0805) BP: (115-145)/(72-91) 129/82 (12/06 0924) SpO2:  [91 %-100 %] 96 % (12/06 0805) FiO2 (%):  [21 %] 21 % (12/06 0745) Weight:  [99 lb 3.3 oz (45 kg)] 99 lb 3.3 oz (45 kg) (12/06 0425)   General: chronically ill appearing female lying in bed, caregiver at bedside HEENT: MM pink/moist, no jvd, trach midline c/d/i Neuro: opens eyes to voice, nods yes / no, communicates  CV: s1s2 rrr, no m/r/g PULM: even/non-labored, lungs bilaterally clear  HU:OHFG, non-tender, bsx4 active   Extremities: warm/dry, no edema  Skin: no rashes or lesions   CBC Latest Ref Rng & Units 12/29/2016 12/28/2016 12/21/2016  WBC 4.0 - 10.5 K/uL 8.2 7.2 5.3  Hemoglobin 12.0 - 15.0 g/dL 13.3 13.7 12.1  Hematocrit 36.0 - 46.0 % 41.4 43.2 37.8  Platelets 150 - 400 K/uL 285 282 318    BMP Latest Ref Rng & Units 12/28/2016 12/28/2016 12/27/2016  Glucose 65 - 99 mg/dL 118(H) 96 99  BUN 6 - 20 mg/dL 19 20 23(H)  Creatinine 0.44 - 1.00 mg/dL 0.34(L) 0.33(L) 0.31(L)  BUN/Creat Ratio 12 - 28 - - -  Sodium 135 - 145 mmol/L 135 133(L) 139  Potassium 3.5 - 5.1 mmol/L 3.7 4.0 3.8  Chloride 101 - 111 mmol/L 97(L) 93(L) 101  CO2 22 - 32 mmol/L _0 Calcium 8.9 - 10.3 mg/dL 8.8(L) 9.1 9.2    Hepatic Function Latest Ref Rng & Units 12/20/2016 12/19/2016 12/17/2016  Total Protein 6.5 - 8.1 g/dL - - -  Albumin 3.5 - 5.0 g/dL 2.9(L) 2.2(L) 2.9(L)  AST 15 - 41 U/L - - -  ALT 14 - 54 U/L - - -  Alk Phosphatase 38 - 126 U/L - - -  Total Bilirubin 0.3 - 1.2 mg/dL - - -  Bilirubin, Direct 0.0 - 0.3 mg/dL - - -   IMAGING/STUDIES: PORT CXR 11/11: Previously reviewed by me. And reticular opacities predominantly in the lung bases with some areas of alveolar filling suggestive of a pneumonitis. PORT CXR 11/19: Previously reviewed by me. Questionable new right lower lung zone opacity.  No pleural effusion appreciated. RUQ U/S 11/19:Unremarkable right upper quadrant ultrasound. CT CHEST W/O 11/21:  Previously reviewed by me. Focal dependent opacity posterior segment right lower lobe. Mucous plugging within left lower lobe noted. No pleural effusion. No pneumothorax. PORT CXR 11/22:  Previously reviewed by me. Questionable progression of left basilar opacity. Low lung volumes today. No new focal opacity appreciated. Still retains good visualization of left hemidiaphragm. Tracheostomy in place.  MICROBIOLOGY: MRSA PCR 11/11 >>  Positive  Blood Cultures x2 11/11 >>  Negative  Tracheal Aspirate Culture  11/11 >>  MRSA Urine Streptococcal Antigen 11/11 >>  Negative Urine Legionella Antigen 11/11 >>  Negative  Urine Culture 11/18 >>  Negative  Blood Cultures x2 11/18 >> neg Tracheal Aspirate Culture 11/19 >>  MRSA   ANTIBIOTICS: Merrem 11/11 - 11/13 Diflucan 11/18- 11/20 Nystatin Powder 11/20 >> Vancomycin 11/11 - 11/17; 11/18 >> 11/24 Fortaz 11/18 >> 11/24  LINES/TUBES: Lurline Idol #6 (ENT) >>>  PEG >>> Foley >>>11/26 PIV  SIGNIFICANT EVENTS: 11/11 - Admit 11/12 - Worsening work of breathing & desat >> trach changed to cuffed #4 & placed on full vent support 11/16 - Transfer to vent SDU bed 11/17 - Transferred back to ICU w/ fever 11/21 - Trach up-sized by ENT to #6 cuffed  11/22 - Tolerated limited PS weaning 11/24 - To SDU  11/25 - ATC x 8-9 hours  11/26 - ATC 40% 11/30 - currently on pressure control ventilation with pressure control of 12. 11/29 - episode of mucus plugging, back on vent 12/02 - weaned 7 hours on ATC, rested on vent overnight 12/03 - ATC  12/05 - Remains on 21% ATC  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 Admitted with acute on chronic hypoxic respiratory failure secondary to pneumonia. Clinically improving. Tolerating limited pressure support weaning.  A: Acute on chronic hypoxic respiratory failure requiring tracheostomy and mechanical ventilatory support.   Chronic mucous plugging Severe malnutrition  History of encephalitis - able to follow commands / communicate Urinary Retention Abnormal renal fxn that resolved, error in labs? Mild loose stools-resolved  Constipation - last BM 12/2   Hypokalemia  Hyponatremia  Hip Fracture - recent, not repaired  P: ATC as tolerated, off vent since 12/3  Mobilize, OOB to chair every day #6 Shiley trach, will need trach change before discharge to cuffless pending disposition  CXR in am O2 as needed to support sats >92% Suction PRN  Continue TF per Nutrition  PT/ OT / SLP  Free water  200 ml Q8  Continue valacyclovir  Primidone QID  Florastor BID  Continue bethanechol BID, wean as progresses with urinary control    Discussion - recent work up with Dr. Shaaron Adler at [facility] for her neuro decline, family would not want neuro assessment here at [facility], this can be continued as outpt or at [facility] (MRI and LP was recommended in future).  [facility] referral was discontinued as she went back on vent.  Case Manager reviewing for discharge to [facility].   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  On exam, lungs are clear.  I reviewed CXR myself, trach is in good position.  Discussed with PCCM-NP.    Respiratory failure:  - Continue TC with humidified air  - Remove vent  from room  HCAP:  - D/C abx  Encephalitis:  - Continue valcyclovir  Trach status:  - Maintain current trach type and size  - ENT to change trach to cuffless prior to leaving  Disposition: awaiting approval from [facility].  Transfer care to [facility] service with PCCM following for trach management.  Patient seen and examined, agree with above note.  I dictated the care and orders written for this patient under my direction.  Rush Farmer, [place]

## 2016-12-30 NOTE — Progress Notes (Signed)
CPT not done. Pt sitting up in chair beside the bed. RT will monitor throughout the night

## 2016-12-30 NOTE — Plan of Care (Signed)
Pt tolerating continuation of trach collar well. O2 Sats remaining above 92 with some increase in respirations into the 30's and 40's infrequently and intermittently .

## 2016-12-31 ENCOUNTER — Inpatient Hospital Stay (HOSPITAL_COMMUNITY): Payer: Medicare Other

## 2016-12-31 DIAGNOSIS — L89152 Pressure ulcer of sacral region, stage 2: Secondary | ICD-10-CM

## 2016-12-31 DIAGNOSIS — K5900 Constipation, unspecified: Secondary | ICD-10-CM

## 2016-12-31 DIAGNOSIS — E876 Hypokalemia: Secondary | ICD-10-CM

## 2016-12-31 DIAGNOSIS — J398 Other specified diseases of upper respiratory tract: Secondary | ICD-10-CM

## 2016-12-31 DIAGNOSIS — R509 Fever, unspecified: Secondary | ICD-10-CM

## 2016-12-31 DIAGNOSIS — J9809 Other diseases of bronchus, not elsewhere classified: Secondary | ICD-10-CM

## 2016-12-31 DIAGNOSIS — E43 Unspecified severe protein-calorie malnutrition: Secondary | ICD-10-CM

## 2016-12-31 DIAGNOSIS — R338 Other retention of urine: Secondary | ICD-10-CM

## 2016-12-31 LAB — GLUCOSE, CAPILLARY
GLUCOSE-CAPILLARY: 125 mg/dL — AB (ref 65–99)
Glucose-Capillary: 138 mg/dL — ABNORMAL HIGH (ref 65–99)
Glucose-Capillary: 131 mg/dL — ABNORMAL HIGH (ref 65–99)
Glucose-Capillary: 117 mg/dL — ABNORMAL HIGH (ref 65–99)
Glucose-Capillary: 114 mg/dL — ABNORMAL HIGH (ref 65–99)
Glucose-Capillary: 126 mg/dL — ABNORMAL HIGH (ref 65–99)

## 2016-12-31 LAB — CBC
HCT: 41.3 % (ref 36.0–46.0)
Hemoglobin: 13 g/dL (ref 12.0–15.0)
MCH: 29.1 pg (ref 26.0–34.0)
MCHC: 31.5 g/dL (ref 30.0–36.0)
MCV: 92.4 fL (ref 78.0–100.0)
PLATELETS: 287 10*3/uL (ref 150–400)
RBC: 4.47 MIL/uL (ref 3.87–5.11)
RDW: 16.8 % — AB (ref 11.5–15.5)
WBC: 8.3 10*3/uL (ref 4.0–10.5)

## 2016-12-31 LAB — BASIC METABOLIC PANEL
Anion gap: 10 (ref 5–15)
BUN: 24 mg/dL — AB (ref 6–20)
CALCIUM: 9 mg/dL (ref 8.9–10.3)
CO2: 30 mmol/L (ref 22–32)
CREATININE: 0.4 mg/dL — AB (ref 0.44–1.00)
Chloride: 101 mmol/L (ref 101–111)
GFR calc Af Amer: >60 mL/min (ref >60)
Glucose, Bld: 85 mg/dL (ref 65–99)
Potassium: 3.5 mmol/L (ref 3.5–5.1)
SODIUM: 141 mmol/L (ref 135–145)

## 2016-12-31 MED ORDER — VANCOMYCIN HCL IN DEXTROSE 1-5 GM/200ML-% IV SOLN
1000.0000 mg | Freq: Once | INTRAVENOUS | Status: AC
  Administered 2016-12-31: 1000 mg via INTRAVENOUS
  Filled 2016-12-31: qty 200

## 2016-12-31 MED ORDER — VANCOMYCIN HCL IN DEXTROSE 1-5 GM/200ML-% IV SOLN: 1000.0000 mg | INTRAVENOUS | Status: DC

## 2016-12-31 MED ORDER — POLYETHYLENE GLYCOL 3350 17 G PO PACK
17.0000 g | PACK | Freq: Two times a day (BID) | ORAL | Status: DC
  Administered 2016-12-31 – 2017-01-04 (×4): 17 g via ORAL
  Filled 2016-12-31 (×5): qty 1

## 2016-12-31 MED ORDER — PIPERACILLIN-TAZOBACTAM 3.375 G IVPB 30 MIN: 3.3750 g | Freq: Three times a day (TID) | INTRAVENOUS | Status: DC

## 2016-12-31 MED ORDER — VANCOMYCIN HCL IN DEXTROSE 750-5 MG/150ML-% IV SOLN
750.0000 mg | Freq: Three times a day (TID) | INTRAVENOUS | Status: DC
  Administered 2016-12-31 – 2017-01-02 (×6): 750 mg via INTRAVENOUS
  Filled 2016-12-31 (×6): qty 150

## 2016-12-31 MED ORDER — PIPERACILLIN-TAZOBACTAM 3.375 G IVPB
3.3750 g | Freq: Three times a day (TID) | INTRAVENOUS | Status: DC
  Administered 2016-12-31 – 2017-01-02 (×7): 3.375 g via INTRAVENOUS
  Filled 2016-12-31 (×7): qty 50

## 2016-12-31 MED ORDER — BETHANECHOL CHLORIDE 5 MG PO TABS: ORAL_TABLET | ORAL | 0 refills | Status: DC

## 2016-12-31 NOTE — Progress Notes (Signed)
[facility] PROGRESS NOTE    Progress Note  Judy Spears  YIF:[id number] DOB: [birth year] DOA: 12/05/2016 PCP: Marton Redwood, MD     Brief Narrative:   Judy Spears is an 66 y.o. female past medical history of chronic trach, chronic respiratory failure, recently diagnosed with cancer underwent lumpectomy in 2018 lives at home with family, recently found to have a right hip fracture and is wheelchair-bound at baseline presents with increasing purulent sputum production and increased work of breathing MICROBIOLOGY: MRSA PCR 11/11 >> Positive  Blood Cultures x2 11/11 >> Negative  Tracheal Aspirate Culture 11/11 >> MRSA Urine Streptococcal Antigen 11/11 >> Negative Urine Legionella Antigen 11/11 >> Negative  Urine Culture 11/18 >> Negative  Blood Cultures x2 11/18 >> neg Tracheal Aspirate Culture 11/19 >> MRSA   ANTIBIOTICS: Merrem 11/11 - 11/13 Diflucan 11/18- 11/20 Nystatin Powder 11/20 >> Vancomycin 11/11 - 11/17; 11/18 >> 11/24 Fortaz 11/18 >> 11/24  SIGNIFICANT EVENTS: 11/11 - Admit 11/12 - Worsening work of breathing & desat >> trach changed to cuffed #4 & placed on full vent support 11/16 - Transfer to vent SDU bed 11/17 - Transferred back to ICU w/ fever 11/21 - Trach up-sized by ENT to #6 cuffed  11/22 - Tolerated limited PS weaning 11/24 - To SDU  11/25 - ATC x 8-9 hours  11/26 - ATC 40% 11/30 - currently on pressure control ventilation with pressure control of 12. 11/29 - episode of mucus plugging, back on vent 12/02 - weaned 7 hours on ATC, rested on vent overnight 12/03 - ATC  12/05 - Remains on 21% ATC     Assessment/Plan:    Acute on chronic respiratory failure with hypoxia ([place]): ATC as tolerated, off x48 hours off since 12/3 #6 cuffed, PCCM to follow as an outpatient. Saturations above 92% on oxygen we will continue to wean.  Continue suction as needed.  New Fever starting on 12/30/2016: Check blood  cultures x2, chest x-ray and urine cultures. We will start empirically on IV vancomycin and Zosyn.  History of encephalitis, arthropod-borne viral  Hyponatremia: Now resolved.   Hypokalemia: Repleted orally now resolved.  Pressure injury of skin stage II: Continue accommodation and rotation  Chronic mucus hypersecretion, respiratory Now resolved  Severe protein-calorie malnutrition [name]: less than 60% of standard weight) (HCC) Continue PEG feedings.  Constipation Start miralaxx   DVT prophylaxis: heaprin Family Communication:husband Disposition Plan/Barrier to D/C: unable to determine Code Status:     Code Status Orders  (From admission, onward)        Start     Ordered   12/05/16 1736  Full code  Continuous     12/05/16 1740    Code Status History    Date Active Date Inactive Code Status Order ID Comments User Context   09/30/2016 17:57 10/27/2016 15:11 Full Code [id number]  Flora Lipps Inpatient   09/30/2016 17:57 09/30/2016 17:57 Full Code [id number]  Bary Leriche PA-C Inpatient   09/24/2016 18:09 09/30/2016 17:30 Full Code [id number]  Omar Person, NP ED   05/06/2016 20:12 05/11/2016 15:40 Full Code [id number]  Corey Harold, NP ED   02/03/2016 14:58 02/25/2016 14:34 Full Code [id number]  Bary Leriche, PA-C Inpatient   12/28/2015 15:58 01/05/2016 03:17 Full Code [id number]  Minor, Grace Bushy, NP Inpatient    Advance Directive Documentation     Most Recent Value  Type of Advance Directive  Living will, Healthcare Power of Attorney  Pre-existing out of facility  DNR order (yellow form or pink MOST form)  No data  "MOST" Form in Place?  No data        IV Access:    Peripheral IV   Procedures and diagnostic studies:   No results found.   Medical Consultants:    None.  Anti-Infectives:   IV vancomycin and Zosyn  Subjective:    Judy Spears no complaints but patient able to close her eyes on command.  Objective:     Vitals:   12/31/16 0423 12/31/16 0600 12/31/16 0810 12/31/16 0812  BP: (!) 153/72  (!) 170/83 (!) 170/83  Pulse:   91 88  Resp:   (!) 36 (!) 30  Temp:  100.3 F (37.9 C) (!) 100.5 F (38.1 C)   TempSrc:  Oral Axillary   SpO2:   97% 96%  Weight:      Height:        Intake/Output Summary (Last 24 hours) at 12/31/2016 0815 Last data filed at 12/31/2016 0600 Gross per 24 hour  Intake 2360 ml  Output -  Net 2360 ml   Filed Weights   12/29/16 0233 12/30/16 0425 12/31/16 0358  Weight: 44 kg (97 lb 0 oz) 45 kg (99 lb 3.3 oz) 45 kg (99 lb 3.3 oz)    Exam: General exam: In no acute distress.  Cachectic appearing Respiratory system: Good air movement and clear to auscultation. Cardiovascular system: Regular rate and rhythm with positive S1-S2 no murmurs rubs gallops Gastrointestinal system: Abdomen is soft nontender nondistended with PEG tube PEG tube does not look erythematous. Extremities: Lower extremity edema but deformed. Skin: Stage II sacral decubitus   Data Reviewed:    Labs: Basic Metabolic Panel: Recent Labs  Lab 12/25/16 0432 12/26/16 0416 12/27/16 0524 12/28/16 0432 12/28/16 1506 12/31/16 0455  NA 142 140 139 133* 135 141  K 3.3* 3.4* 3.8 4.0 3.7 3.5  CL 102 102 101 93* 97* 101  CO2 30 29 30 29 28 30   GLUCOSE 111* 136* 99 96 118* 85  BUN 23* 19 23* 20 19 24*  CREATININE <0.30* 0.31* 0.31* 0.33* 0.34* 0.40*  CALCIUM 9.0 9.2 9.2 9.1 8.8* 9.0  MG 2.0  --   --   --   --   --   PHOS 3.4  --   --   --   --   --    GFR Estimated Creatinine Clearance: 49.8 mL/min (A) (by C-G formula based on SCr of 0.4 mg/dL (L)). Liver Function Tests: No results for input(s): AST, ALT, ALKPHOS, BILITOT, PROT, ALBUMIN in the last 168 hours. No results for input(s): LIPASE, AMYLASE in the last 168 hours. No results for input(s): AMMONIA in the last 168 hours. Coagulation profile No results for input(s): INR, PROTIME in the last 168 hours.  CBC: Recent Labs  Lab  12/28/16 0432 12/29/16 0604 12/31/16 0455  WBC 7.2 8.2 8.3  HGB 13.7 13.3 13.0  HCT 43.2 41.4 41.3  MCV 90.9 90.6 92.4  PLT 282 285 287   Cardiac Enzymes: No results for input(s): CKTOTAL, CKMB, CKMBINDEX, TROPONINI in the last 168 hours. BNP (last 3 results) No results for input(s): PROBNP in the last 8760 hours. CBG: Recent Labs  Lab 12/30/16 1306 12/30/16 1545 12/30/16 2117 12/30/16 2343 12/31/16 0347  GLUCAP 122* 158* 136* 113* 138*   D-Dimer: No results for input(s): DDIMER in the last 72 hours. Hgb A1c: No results for input(s): HGBA1C in the last 72 hours. Lipid Profile: No  results for input(s): CHOL, HDL, LDLCALC, TRIG, CHOLHDL, LDLDIRECT in the last 72 hours. Thyroid function studies: No results for input(s): TSH, T4TOTAL, T3FREE, THYROIDAB in the last 72 hours.  Invalid input(s): FREET3 Anemia work up: No results for input(s): VITAMINB12, FOLATE, FERRITIN, TIBC, IRON, RETICCTPCT in the last 72 hours. Sepsis Labs: Recent Labs  Lab 12/28/16 0432 12/29/16 0604 12/31/16 0455  WBC 7.2 8.2 8.3   Microbiology No results found for this or any previous visit (from the past 240 hour(s)).   Medications:   . amantadine  150 mg Per Tube BID WC  . amLODipine  10 mg Per Tube Daily  . atorvastatin  20 mg Per Tube QHS  . baclofen  5 mg Per Tube QID  . bethanechol  5 mg Oral BID  . calcium-vitamin D  2 tablet Oral Q breakfast  . chlorhexidine  15 mL Mouth Rinse BID  . cholecalciferol  2,000 Units Oral Daily  . docusate  100 mg Per Tube Daily  . feeding supplement (PRO-STAT SUGAR FREE 64)  30 mL Per Tube BID  . free water  200 mL Per Tube Q8H  . gabapentin  100 mg Per Tube BID  . guaiFENesin  10 mL Per Tube QID  . heparin  5,000 Units Subcutaneous Q8H  . mouth rinse  15 mL Mouth Rinse q12n4p  . Melatonin  6 mg Per Tube QPM  . nystatin   Topical BID  . pantoprazole sodium  40 mg Per Tube Q24H  . primidone  50 mg Per Tube QID  . saccharomyces boulardii  250 mg  Per Tube BID  . sodium chloride HYPERTONIC  4 mL Nebulization BID  . valACYclovir  500 mg Per Tube Daily  . vitamin C  500 mg Per Tube Daily   Continuous Infusions: . feeding supplement (OSMOLITE 1.5 CAL) 1,000 mL (12/31/16 0227)      LOS: 26 days   [facility] Pager [phone]  *Please refer to [email or web address], password [email or web address] to get updated schedule on who will round on this patient, as hospitalists switch teams weekly. If 7PM-7AM, please contact night-coverage at [email or web address], password [email or web address] for any overnight needs.  12/31/2016, 8:15 AM

## 2016-12-31 NOTE — Progress Notes (Signed)
Pt spiked low grade fever around 0400, I gave PRN tylenol in an attempt to bring temp down. At 0600, temp was still up at 100.3. I text paged on call NP blount in regards to the PRN not helping, no new orders have been given. I will pass this on to day time RN and I will continue to monitor the pt.

## 2016-12-31 NOTE — Progress Notes (Signed)
Chest PT held at this time. Patient out of bed, in chair. Family will call when patient goes back to bed and is ready for chest PT.

## 2016-12-31 NOTE — Plan of Care (Signed)
Pt was able to sit in chair today for a few hours, extending into night shift. Pt did well and was able to move a lot of secretions. Pt put back on O2 at 8 L/min per respiratory therapist for 02 sats around 89-91 after tracheal suctioning and changing inner cannula. Pt transitioned back to bed well and has been resting well since.

## 2016-12-31 NOTE — Progress Notes (Addendum)
Patient is approved for the [facility] informed her spouse and Dr. Nelda Marseille. Will let [facility] know.  Unfortunately patient has new fever and iv abx started, UA  And cxr ,  [facility]'t feel comfortable taking patient today with fever.  They will look at her on Monday [facility],hopefully she will not have a fever. Dr. Olevia Bowens notified of this information and patient's spouse.

## 2016-12-31 NOTE — Progress Notes (Signed)
Occupational Therapy Treatment Patient Details Name: Judy Spears MRN: [id number] DOB: [birth year] Today's Date: 12/31/2016    History of present illness 66 yo female with acute on chronic respiratory failure from HCAP. PMHx of breast cancer in Summer 2018, Rt hip fx (which has not been repaired), [place] encephalitis. Had initial improvement and transferred to vent SDU. Developed progressive fever, increase WOB, and rash around G tube site. Transferred back to ICU.   OT comments  Pt tolerating therapeutic activities, ROM, trunk strengthening and transfer to her w/c with stable vital signs. Caregiver participating in session. Needs new straps for resting hand splint.  Follow Up Recommendations  Supervision/Assistance - 24 hour    Equipment Recommendations  None recommended by OT    Recommendations for Other Services      Precautions / Restrictions Precautions Precautions: Fall Precaution Comments: trach, PEG  Restrictions Weight Bearing Restrictions: No       Mobility Bed Mobility Overal bed mobility: Needs Assistance Bed Mobility: Rolling;Supine to Sit Rolling: +2 for physical assistance;Total assist Sidelying to sit: +2 for physical assistance;Total assist Supine to sit: +2 for physical assistance;Total assist Sit to supine: Total assist;+2 for physical assistance   General bed mobility comments: Worked on initiating rolling from partial sidelying toward R side to activate trunk musculature and facilitate neck ROM prior to assisting pt to EOB, also worked on supine on bent elbows to sit with pt assisting 10-15%  Transfers Overall transfer level: Needs assistance   Transfers: Squat Pivot Transfers Sit to Stand: Total assist;+2 safety/equipment(with pad) Stand pivot transfers: Total assist Squat pivot transfers: Total assist;+2 safety/equipment     General transfer comment: transferred bed to her w/c with bed pad, pt minimally placing weight through  feet    Balance Overall balance assessment: Needs assistance Sitting-balance support: Feet supported Sitting balance-Leahy Scale: Poor Sitting balance - Comments: L side lean, worked on weight bearing on each elbow and returning to sit, max assist from L with pt assisting 15%, min assist from R Postural control: Posterior lean;Left lateral lean Standing balance support: No upper extremity supported;During functional activity Standing balance-Leahy Scale: Poor Standing balance comment: Pt needed total assist to stand with use of pad for forward lean.                            ADL either performed or assessed with clinical judgement   ADL                       Lower Body Dressing: Total assistance;Bed level Lower Body Dressing Details (indicate cue type and reason): for mesh panties, shoes and socks                     Vision       Perception     Praxis      Cognition Arousal/Alertness: Awake/alert Behavior During Therapy: WFL for tasks assessed/performed Overall Cognitive Status: Difficult to assess                                 General Comments: follows commands with increased time        Exercises Exercises: General Upper Extremity General Exercises - Upper Extremity Shoulder Flexion: AAROM;Supine;10 reps Elbow Flexion: AAROM;10 reps;Supine;Right Elbow Extension: AAROM;Both;10 reps;Supine Wrist Flexion: PROM;Both;10 reps;Supine Composite Extension: PROM;Both;10 reps;Supine General Exercises - Lower Extremity Long Arc Quad:  AROM;Both;10 reps;Seated Heel Slides: AAROM;Both;10 reps;Supine Hip ABduction/ADduction: AAROM;Both;10 reps;Supine   Shoulder Instructions       General Comments      Pertinent Vitals/ Pain       Pain Assessment: Faces(Simultaneous filing. User may not have seen previous data.) Faces Pain Scale: No hurt Pain Location: Rt LE  Pain Descriptors / Indicators: Spasm;Restless;Grimacing Pain  Intervention(s): Limited activity within patient's tolerance;Monitored during session;Premedicated before session;Repositioned  Home Living                                          Prior Functioning/Environment              Frequency  Min 2X/week        Progress Toward Goals  OT Goals(current goals can now be found in the care plan section)  Progress towards OT goals: Progressing toward goals  Acute Rehab OT Goals Patient Stated Goal: to regain function  OT Goal Formulation: With patient/family Time For Goal Achievement: 01/08/17 Potential to Achieve Goals: [place] Discharge plan remains appropriate    Co-evaluation    PT/OT/SLP Co-Evaluation/Treatment: Yes Reason for Co-Treatment: Complexity of the patient's impairments (multi-system involvement);For patient/therapist safety(Simultaneous filing. User may not have seen previous data.) PT goals addressed during session: Mobility/safety with mobility OT goals addressed during session: Strengthening/ROM      AM-PAC PT "6 Clicks" Daily Activity     Outcome Measure   Help from another person eating meals?: Total Help from another person taking care of personal grooming?: Total Help from another person toileting, which includes using toliet, bedpan, or urinal?: Total Help from another person bathing (including washing, rinsing, drying)?: Total Help from another person to put on and taking off regular upper body clothing?: Total Help from another person to put on and taking off regular lower body clothing?: Total 6 Click Score: 6    End of Session Equipment Utilized During Treatment: Oxygen  OT Visit Diagnosis: Muscle weakness (generalized) (M62.81)   Activity Tolerance Patient tolerated treatment well   Patient Left in chair;with family/visitor present;with nursing/sitter in room   Nurse Communication Mobility status        Time: 0254-2706 OT Time Calculation (min): 58 min  Charges:  OT General Charges $OT Visit: 1 Visit OT Treatments $Therapeutic Activity: 23-37 mins  12/31/2016 Nestor Lewandowsky, OTR/L Pager: [place], Haze Boyden 12/31/2016, 3:46 PM

## 2016-12-31 NOTE — Progress Notes (Addendum)
NCM left Medical Necessity on chart with face sheet. NCM informed Charge Nurse Elmyra Ricks to call [facility] at [phone] on Monday to see if they can take patient on Monday if patient has no fevers, If they can take her let MD know to do DC summary , DC summary will need to be faxed to [phone] and call and give report to their charge Nurse,  ( the Number given to me on Friday was [phone], but they may give you another number), Complete [facility] form, Call [facility] for transport [phone].  If you have any questions please contact Harlan Stains NCM at [phone].

## 2016-12-31 NOTE — Progress Notes (Addendum)
Physical Therapy Treatment Patient Details Name: Judy Spears MRN: [id number] DOB: 1950/02/17 Today's Date: 12/31/2016    History of Present Illness 66 yo female with acute on chronic respiratory failure from HCAP. PMHx of breast cancer in Summer 2018, Rt hip fx (which has not been repaired), [place] encephalitis. Had initial improvement and transferred to vent SDU. Developed progressive fever, increase WOB, and rash around G tube site. Transferred back to ICU.    PT Comments    Pt admitted with above diagnosis. Pt currently with functional limitations due to balance and endurance deficits. Worked on stretching with pt in supine and sitting.  Pt able to activate muscles to assist PT with 10-15% effort as pt is limited by tone in muscles.  Pt moved to wheelchair with total assist of 2.   Goals unmet as pt making slow progress.  Goals revised today.  Pt will benefit from skilled PT to increase their independence and safety with mobility to allow discharge to the venue listed below.     Follow Up Recommendations  CIR     Equipment Recommendations  (Consider lift equipment)    Recommendations for Other Services       Precautions / Restrictions Precautions Precautions: Fall Precaution Comments: trach, PEG  Restrictions Weight Bearing Restrictions: No    Mobility  Bed Mobility Overal bed mobility: Needs Assistance Bed Mobility: Rolling;Sidelying to Sit Rolling: Total assist;+2 for physical assistance(Partial roll pt initiates 10-15% ) Sidelying to sit: +2 for physical assistance;Total assist Supine to sit: Total assist;+2 for physical assistance Sit to supine: Total assist;+2 for physical assistance   General bed mobility comments: rolling/trunk rotation to reduce tone prior to sitting up, +2 total for all aspects of bed mobility.  Transfers Overall transfer level: Needs assistance   Transfers: Squat Pivot Transfers Sit to Stand: Total assist;+2  safety/equipment(with pad)   Squat pivot transfers: Total assist;+2 safety/equipment     General transfer comment: transfered bed to her w/c toward L with arm of chair removed.  Pt was able to stand on feet (put her shoes on) for pivot transfer with use of pad and therapist assisting but pt did weight bear and stand on feet to pivot.  Assist to move pt back in chair appropriately. Also used pad to position pt so she was in midline.  Pt and Caregiver happy with pt position in chair.    Ambulation/Gait             General Gait Details: non ambulatory   Stairs            Wheelchair Mobility    Modified Rankin (Stroke Patients Only)       Balance Overall balance assessment: Needs assistance Sitting-balance support: Feet supported;No upper extremity supported Sitting balance-Leahy Scale: Poor Sitting balance - Comments: total assist initially, progressed to mod to min briefly after activating trunk with lateral flexion onto each elbow.  At times, pt kicks LEs out and have to cue to "put feet on floor" .Pt pulled up from lying straight back with 15% effort.  Able to push up from right elbow with min assist and left elbow with max assist.  Postural control: Posterior lean;Left lateral lean Standing balance support: No upper extremity supported;During functional activity Standing balance-Leahy Scale: Poor Standing balance comment: Pt needed total assist to stand with use of pad for forward lean.  Cognition Arousal/Alertness: Awake/alert Behavior During Therapy: Restless Overall Cognitive Status: Difficult to assess                                 General Comments: Pt follows one step commands ~75% of the time.  She will mouth words, but difficult to discern what she is trying to say       Exercises General Exercises - Upper Extremity Shoulder Flexion: PROM;10 reps;Supine;Both Elbow Extension: AAROM;Both;10  reps;Supine General Exercises - Lower Extremity Long Arc Quad: AROM;Both;10 reps;Seated Heel Slides: AAROM;Both;10 reps;Supine Hip ABduction/ADduction: AAROM;Both;10 reps;Supine    General Comments        Pertinent Vitals/Pain Pain Assessment: Faces Faces Pain Scale: Hurts even more Pain Location: Rt LE  Pain Descriptors / Indicators: Spasm;Restless;Grimacing Pain Intervention(s): Limited activity within patient's tolerance;Monitored during session;Premedicated before session;Repositioned  VSS  Home Living                      Prior Function            PT Goals (current goals can now be found in the care plan section) Acute Rehab PT Goals PT Goal Formulation: With family Time For Goal Achievement: 01/14/17 Potential to Achieve Goals: Fair Progress towards PT goals: Progressing toward goals    Frequency    Min 2X/week      PT Plan Current plan remains appropriate    Co-evaluation PT/OT/SLP Co-Evaluation/Treatment: Yes Reason for Co-Treatment: Complexity of the patient's impairments (multi-system involvement) PT goals addressed during session: Mobility/safety with mobility        AM-PAC PT "6 Clicks" Daily Activity  Outcome Measure  Difficulty turning over in bed (including adjusting bedclothes, sheets and blankets)?: Unable Difficulty moving from lying on back to sitting on the side of the bed? : Unable Difficulty sitting down on and standing up from a chair with arms (e.g., wheelchair, bedside commode, etc,.)?: Unable Help needed moving to and from a bed to chair (including a wheelchair)?: Total Help needed walking in hospital room?: Total Help needed climbing 3-5 steps with a railing? : Total 6 Click Score: 6    End of Session Equipment Utilized During Treatment: Gait belt;Oxygen(trach collar) Activity Tolerance: Patient tolerated treatment well Patient left: in chair;with call bell/phone within reach;with family/visitor present Nurse  Communication: Mobility status;Need for lift equipment PT Visit Diagnosis: Other abnormalities of gait and mobility (R26.89);Muscle weakness (generalized) (M62.81) Pain - Right/Left: Right Pain - part of body: Hip     Time: 1610-9604 PT Time Calculation (min) (ACUTE ONLY): 57 min  Charges:  $Therapeutic Exercise: 8-22 mins $Therapeutic Activity: 8-22 mins                    G Codes:       Neomia Herbel,PT Acute Rehabilitation [phone] (pager)    [name] 12/31/2016, 3:39 PM

## 2016-12-31 NOTE — Plan of Care (Deleted)
Pt has done well since getting trach removed, O2 sats 95-100, RR WNL.

## 2016-12-31 NOTE — Progress Notes (Signed)
Pharmacy Antibiotic Note  Judy Spears is a 66 y.o. female admitted on 12/05/2016 with sepsis.  Pharmacy has been consulted for vancomycin dosing.  Tmax of 100.5 this morning. WBC wnl. SCr low. Had been low on previous vanc doses and therapeutic on 750mg  IV Q8h. UOP not all charted.  Plan: Start Zosyn 3.375 gm IV q8h (4 hour infusion) Give vancomycin 1g IV x 1, then start vancomycin 750mg  IV Q8h Monitor clinical picture, renal function, VT tomorrow F/U C&S, abx deescalation / LOT  Height: 5\' 2"  (157.5 cm) Weight: 99 lb 3.3 oz (45 kg) IBW/kg (Calculated) : 50.1  Temp (24hrs), Avg:99.4 F (37.4 C), Min:97.8 F (36.6 C), Max:100.5 F (38.1 C)  Recent Labs  Lab 12/26/16 0416 12/27/16 0524 12/28/16 0432 12/28/16 1506 12/29/16 0604 12/31/16 0455  WBC  --   --  7.2  --  8.2 8.3  CREATININE 0.31* 0.31* 0.33* 0.34*  --  0.40*    Estimated Creatinine Clearance: 49.8 mL/min (A) (by C-G formula based on SCr of 0.4 mg/dL (L)).    Allergies  Allergen Reactions  . Cefepime Other (See Comments)    Status epilepticus- this reaction is questionable per husband.States that it was determined that pt did not have seizure activity. Status epilepticus  . Tetracyclines & Related Photosensitivity and Other (See Comments)    Blisters (childhood allergy)  . Doxycycline Monohydrate Other (See Comments)    Blisters, photosensitivity- family state that the reaction is actually to tetracycline.  . Pollen Extract Other (See Comments)    Running nose (seasonal allergies)    Antimicrobials this admission: Vanc 11/11>>11/17; restart 11/18 >>11/24; 12/7 >> Merrem 11/11>>11/13 [facility] 11/18 >>11/24 Fluc 11/18 >>11/19 Zosyn 12/7 >>  Dose adjustments this admission: 11/15 VT = 8 mcg/mL on 500mg  q8 >> 750mg  q8 11/19 VT = 15 mcg/mL on 750mg  q8h  Microbiology results: 11/11BCx -negative 11/11TA - MRSA 11/11 Strep pneumo - negative 11/11 MRSA PCR - positive 11/18 BCx -  NGTD 11/18 UCx - negative 11/19 Resp - MRSA 12/7 BCx: sent 12/7 UCx: sent  Thank you for allowing pharmacy to be a part of this patient's care.  Reginia Naas 12/31/2016 8:34 AM

## 2016-12-31 NOTE — Progress Notes (Signed)
Trach tube due to be changed. MD requests ENT to do change.

## 2016-12-31 NOTE — Plan of Care (Signed)
Patient is progressing well. Patient got up out of bed into wheelchair today and tolerated ambulation fairly well. Husband was updated on current plan for patient and is agreeable to the plan. Will continue to monitor and assess .

## 2017-01-01 ENCOUNTER — Inpatient Hospital Stay (HOSPITAL_COMMUNITY): Payer: Medicare Other

## 2017-01-01 LAB — BASIC METABOLIC PANEL
Anion gap: 10 (ref 5–15)
BUN: 23 mg/dL — AB (ref 6–20)
CALCIUM: 8.9 mg/dL (ref 8.9–10.3)
CO2: 28 mmol/L (ref 22–32)
CREATININE: 0.33 mg/dL — AB (ref 0.44–1.00)
Chloride: 99 mmol/L — ABNORMAL LOW (ref 101–111)
GFR calc Af Amer: >60 mL/min (ref >60)
GLUCOSE: 87 mg/dL (ref 65–99)
POTASSIUM: 3.2 mmol/L — AB (ref 3.5–5.1)
SODIUM: 137 mmol/L (ref 135–145)

## 2017-01-01 LAB — GLUCOSE, CAPILLARY
GLUCOSE-CAPILLARY: 127 mg/dL — AB (ref 65–99)
Glucose-Capillary: 108 mg/dL — ABNORMAL HIGH (ref 65–99)
Glucose-Capillary: 115 mg/dL — ABNORMAL HIGH (ref 65–99)
Glucose-Capillary: 107 mg/dL — ABNORMAL HIGH (ref 65–99)
Glucose-Capillary: 120 mg/dL — ABNORMAL HIGH (ref 65–99)

## 2017-01-01 MED ORDER — POTASSIUM CHLORIDE CRYS ER 20 MEQ PO TBCR
40.0000 meq | EXTENDED_RELEASE_TABLET | Freq: Every day | ORAL | Status: DC
  Administered 2017-01-01 – 2017-01-04 (×4): 40 meq via ORAL
  Filled 2017-01-01 (×4): qty 2

## 2017-01-01 NOTE — Progress Notes (Signed)
Occupational Therapy Treatment Patient Details Name: Judy Spears MRN: [id number] DOB: [birth year] Today's Date: 01/01/2017    History of present illness 66 yo female with acute on chronic respiratory failure from HCAP. PMHx of breast cancer in Summer 2018, Rt hip fx (which has not been repaired), [place] encephalitis. Had initial improvement and transferred to vent SDU. Developed progressive fever, increase WOB, and rash around G tube site. Transferred back to ICU.   OT comments  Pt seen to replace straps and velcro for R resting hand splint. Splint donned and fits well after modifications. Provided pt with extra straps and stockinette. D/c plan remains appropriate. Will continue to follow acutely.   Follow Up Recommendations  Supervision/Assistance - 24 hour    Equipment Recommendations  None recommended by OT    Recommendations for Other Services      Precautions / Restrictions Precautions Precautions: Fall Precaution Comments: trach, PEG  Restrictions Weight Bearing Restrictions: No       Mobility Bed Mobility                  Transfers                      Balance                                           ADL either performed or assessed with clinical judgement   ADL                                               Vision       Perception     Praxis      Cognition Arousal/Alertness: Awake/alert Behavior During Therapy: WFL for tasks assessed/performed Overall Cognitive Status: Difficult to assess                                          Exercises     Shoulder Instructions       General Comments Replaced velco and straps on pts R resting hand splint. Provided pt with 3 extra straps and 2 stockinette    Pertinent Vitals/ Pain       Pain Assessment: Faces Faces Pain Scale: No hurt  Home Living                                          Prior  Functioning/Environment              Frequency  Min 2X/week        Progress Toward Goals  OT Goals(current goals can now be found in the care plan section)  Progress towards OT goals: Progressing toward goals  Acute Rehab OT Goals Patient Stated Goal: none stated  Plan Discharge plan remains appropriate    Co-evaluation                 AM-PAC PT "6 Clicks" Daily Activity     Outcome Measure   Help from another person eating meals?: Total Help from another person taking care of personal  grooming?: Total Help from another person toileting, which includes using toliet, bedpan, or urinal?: Total Help from another person bathing (including washing, rinsing, drying)?: Total Help from another person to put on and taking off regular upper body clothing?: Total Help from another person to put on and taking off regular lower body clothing?: Total 6 Click Score: 6    End of Session Equipment Utilized During Treatment: Oxygen  OT Visit Diagnosis: Muscle weakness (generalized) (M62.81)   Activity Tolerance Patient tolerated treatment well   Patient Left in chair   Nurse Communication          Time: 3817-7116 OT Time Calculation (min): 24 min  Charges: OT General Charges $OT Visit: 1 Visit OT Treatments $Orthotics/Prosthetics Check: 23-37 mins  Carlyn Lemke A. Ulice Brilliant, M.S., OTR/L Pager: [place] 01/01/2017, 1:09 PM

## 2017-01-01 NOTE — Progress Notes (Signed)
1200 Chest PT held at this time. Patient out of bed in chair. Will continue to monitor and perform chest PT as soon as patient is put back to bed.

## 2017-01-01 NOTE — Progress Notes (Signed)
[facility] PROGRESS NOTE    Progress Note  Judy Spears Judy Spears  IDP:[id number] DOB: [birth year] DOA: 12/05/2016 PCP: Marton Redwood, MD     Brief Narrative:   Judy Spears is an 66 y.o. 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  Note recent hospital stay from 09/24/16 with lethargy-found to have MRSAtracheobornchtiis vs PNA--Rx wih Doxy--course complic byUrinary retenttion  MICROBIOLOGY: MRSA PCR 11/11 >> Positive  Blood Cultures x2 11/11 >> Negative  Tracheal Aspirate Culture 11/11 >> MRSA Urine Streptococcal Antigen 11/11 >> Negative Urine Legionella Antigen 11/11 >> Negative  Urine Culture 11/18 >> Negative  Blood Cultures x2 11/18 >> neg Tracheal Aspirate Culture 11/19 >> MRSA   ANTIBIOTICS: Merrem 11/11 - 11/13 Diflucan 11/18- 11/20 Nystatin Powder 11/20 >> Vancomycin 11/11 - 11/17; 11/18 >> 11/24 Fortaz 11/18 >> 11/24  SIGNIFICANT EVENTS: 11/11 - Admit 11/12 - Worsening work of breathing & desat >> trach changed to cuffed #4 & placed on full vent support 11/16 - Transfer to vent SDU bed 11/17 - Transferred back to ICU w/ fever 11/21 - Trach up-sized by ENT to #6 cuffed  11/22 - Tolerated limited PS weaning 11/24 - To SDU  11/25 - ATC x 8-9 hours  11/26 - ATC 40% 11/30 - currently on pressure control ventilation with pressure control of 12. 11/29 - episode of mucus plugging, back on vent 12/02 - weaned 7 hours on ATC, rested on vent overnight 12/03 - ATC  12/05 - Remains on 21% ATC     Assessment/Plan:    Acute on chronic respiratory failure with hypoxia  ([place]): ATC as tolerated, off x48 hours off since 12/3 #6 cuffed, PCCM to follow as an outpatient. Will need ENT to downsize the same Saturations above 92% on oxygen we will continue to wean.  Continue suction as needed.  New Fever starting on 12/30/2016: blood cultures x2 are neg so far to date CXR shows infiltrate /consoloidation--? endobronchial occlusion Will follow with CXR this am 12/6 empirically on IV vancomycin and Zosyn but low threshold to de-escalate the same if no further fever or chills or clinical s/s of PNa Get pct and lactic to guide therapy in am  History of encephalitis, arthropod-borne viral Fully dependant for ADL's Await probably CIR vs LTACH Have cancelled palliaitive care meeting for EOL care at this time  Hyponatremia: Now resolved.   Hypokalemia: Repleted orally now resolved.  Pressure injury of skin stage II: Continue accommodation and rotation  Chronic mucus hypersecretion, respiratory Now resolved  Severe protein-calorie malnutrition [name]: less than 60% of standard weight) (HCC) Continue PEG feedings.  Constipation Start miralaxx   DVT prophylaxis: heparin Family Communication:husband Disposition Plan/Barrier to D/C: unable to determine Code Status:     Code Status Orders  (From admission, onward)        Start     Ordered   12/05/16 1736  Full code  Continuous     12/05/16 1740    Code Status History    Date Active Date Inactive Code Status Order ID Comments User Context   09/30/2016 17:57 10/27/2016 15:11 Full Code [id number]  Flora Lipps Inpatient   09/30/2016 17:57 09/30/2016 17:57 Full Code [id number]  Flora Lipps Inpatient   09/24/2016 18:09 09/30/2016 17:30 Full Code [id number]  Omar Person, NP ED   05/06/2016 20:12 05/11/2016 15:40 Full Code [id number]  Corey Harold, NP ED   02/03/2016 14:58 02/25/2016 14:34 Full Code [id number]  Bary Leriche, PA-C Inpatient   12/28/2015 15:58 01/05/2016 03:17 Full Code [id number]   Minor, Grace Bushy, NP Inpatient    Advance Directive Documentation     Most Recent Value  Type of Advance Directive  Living will, Healthcare Power of Attorney  Pre-existing out of facility DNR order (yellow form or pink MOST form)  No data  "MOST" Form in Place?  No data        IV Access:    Peripheral IV   Procedures and diagnostic studies:   Dg Chest Port 1 View  Result Date: 12/31/2016 CLINICAL DATA:  Tracheostomy. EXAM: PORTABLE CHEST 1 VIEW COMPARISON:  Radiograph December 25, 2016. FINDINGS: Stable cardiomediastinal silhouette. Tracheostomy tube is in grossly good position. No pneumothorax is noted. Mild right basilar subsegmental atelectasis is noted. There appears to be increased atelectasis or infiltrate of left lower lobe with possible mild left pleural effusion. Bony thorax is unremarkable. IMPRESSION: Stable tracheostomy tube. Mild right basilar subsegmental atelectasis. Increased left lower lobe atelectasis or infiltrate is noted with possible mild left pleural effusion. Endobronchial occlusion cannot be excluded. CT or bronchoscopy may be performed for further evaluation. Electronically Signed   By: Marijo Conception, M.D.   On: 12/31/2016 08:38     Medical Consultants:    None.  Anti-Infectives:   IV vancomycin and Zosyn  Subjective:   Awake and responsive Cannot verbalize but able to motion and mouth words Husband in room doing oral care Not in distress   Objective:    Vitals:   01/01/17 0400 01/01/17 0426 01/01/17 0532 01/01/17 0738  BP: 112/79 112/79  123/81  Pulse: 75 76  77  Resp: (!) 27 (!) 32  (!) 31  Temp:    98.4 F (36.9 C)  TempSrc:    Oral  SpO2: 98% 96%  97%  Weight:   46.6 kg (102 lb 11.8 oz)   Height:        Intake/Output Summary (Last 24 hours) at 01/01/2017 0801 Last data filed at 01/01/2017 0535 Gross per 24 hour  Intake 2950 ml  Output 250 ml  Net 2700 ml   Filed Weights   12/30/16 0425 12/31/16 0358 01/01/17 0532   Weight: 45 kg (99 lb 3.3 oz) 45 kg (99 lb 3.3 oz) 46.6 kg (102 lb 11.8 oz)    Exam: General exam: Cachectic appearing Respiratory system: Good air movement and clear to auscultation. Cardiovascular system: Regular rate and rhythm with positive S1-S2 no murmurs rubs gallops Gastrointestinal system: Abdomen is soft nontender nondistended with PEG tube does not look erythematous. Extremities: Lower extremity edema but deformed. Skin: Stage II sacral decubitus   Data Reviewed:    Labs: Basic Metabolic Panel: Recent Labs  Lab 12/27/16 0524 12/28/16 0432 12/28/16 1506 12/31/16 0455 01/01/17 0252  NA 139 133* 135 141 137  K 3.8 4.0 3.7 3.5 3.2*  CL 101 93* 97* 101 99*  CO2 30 29 28 30 28   GLUCOSE 99 96 118* 85 87  BUN 23* 20 19 24* 23*  CREATININE 0.31* 0.33* 0.34* 0.40* 0.33*  CALCIUM 9.2 9.1 8.8* 9.0 8.9   GFR Estimated Creatinine Clearance: 51.6 mL/min (A) (by C-G formula based on SCr of 0.33 mg/dL (L)). Liver Function  Tests: No results for input(s): AST, ALT, ALKPHOS, BILITOT, PROT, ALBUMIN in the last 168 hours. No results for input(s): LIPASE, AMYLASE in the last 168 hours. No results for input(s): AMMONIA in the last 168 hours. Coagulation profile No results for input(s): INR, PROTIME in the last 168 hours.  CBC: Recent Labs  Lab 12/28/16 0432 12/29/16 0604 12/31/16 0455  WBC 7.2 8.2 8.3  HGB 13.7 13.3 13.0  HCT 43.2 41.4 41.3  MCV 90.9 90.6 92.4  PLT 282 285 287   Cardiac Enzymes: No results for input(s): CKTOTAL, CKMB, CKMBINDEX, TROPONINI in the last 168 hours. BNP (last 3 results) No results for input(s): PROBNP in the last 8760 hours. CBG: Recent Labs  Lab 12/31/16 1620 12/31/16 1946 12/31/16 2321 01/01/17 0329 01/01/17 0737  GLUCAP 125* 114* 126* 127* 108*   D-Dimer: No results for input(s): DDIMER in the last 72 hours. Hgb A1c: No results for input(s): HGBA1C in the last 72 hours. Lipid Profile: No results for input(s): CHOL, HDL,  LDLCALC, TRIG, CHOLHDL, LDLDIRECT in the last 72 hours. Thyroid function studies: No results for input(s): TSH, T4TOTAL, T3FREE, THYROIDAB in the last 72 hours.  Invalid input(s): FREET3 Anemia work up: No results for input(s): VITAMINB12, FOLATE, FERRITIN, TIBC, IRON, RETICCTPCT in the last 72 hours. Sepsis Labs: Recent Labs  Lab 12/28/16 0432 12/29/16 0604 12/31/16 0455  WBC 7.2 8.2 8.3   Microbiology Recent Results (from the past 240 hour(s))  Culture, blood (Routine X 2) w Reflex to ID Panel     Status: None (Preliminary result)   Collection Time: 12/31/16  9:25 AM  Result Value Ref Range Status   Specimen Description BLOOD RIGHT HAND  Final   Special Requests   Final    IN PEDIATRIC BOTTLE Blood Culture results may not be optimal due to an excessive volume of blood received in culture bottles   Culture PENDING  Incomplete   Report Status PENDING  Incomplete     Medications:   . amantadine  150 mg Per Tube BID WC  . amLODipine  10 mg Per Tube Daily  . atorvastatin  20 mg Per Tube QHS  . baclofen  5 mg Per Tube QID  . bethanechol  5 mg Oral BID  . calcium-vitamin D  2 tablet Oral Q breakfast  . chlorhexidine  15 mL Mouth Rinse BID  . cholecalciferol  2,000 Units Oral Daily  . docusate  100 mg Per Tube Daily  . feeding supplement (PRO-STAT SUGAR FREE 64)  30 mL Per Tube BID  . free water  200 mL Per Tube Q8H  . gabapentin  100 mg Per Tube BID  . guaiFENesin  10 mL Per Tube QID  . heparin  5,000 Units Subcutaneous Q8H  . mouth rinse  15 mL Mouth Rinse q12n4p  . Melatonin  6 mg Per Tube QPM  . nystatin   Topical BID  . pantoprazole sodium  40 mg Per Tube Q24H  . polyethylene glycol  17 g Oral BID  . potassium chloride  40 mEq Oral Daily  . primidone  50 mg Per Tube QID  . saccharomyces boulardii  250 mg Per Tube BID  . sodium chloride HYPERTONIC  4 mL Nebulization BID  . valACYclovir  500 mg Per Tube Daily  . vitamin C  500 mg Per Tube Daily   Continuous  Infusions: . feeding supplement (OSMOLITE 1.5 CAL) 1,000 mL (01/01/17 0431)  . piperacillin-tazobactam (ZOSYN)  IV Stopped (01/01/17 0535)  . vancomycin  Stopped (01/01/17 0230)      LOS: 27 days   Verneita Griffes, MD [facility]  *Please refer to [email or web address], password [email or web address] to get updated schedule on who will round on this patient, as hospitalists switch teams weekly. If 7PM-7AM, please contact night-coverage at [email or web address], password [email or web address] for any overnight needs.  01/01/2017, 8:01 AM

## 2017-01-02 ENCOUNTER — Inpatient Hospital Stay (HOSPITAL_COMMUNITY): Payer: Medicare Other

## 2017-01-02 LAB — COMPREHENSIVE METABOLIC PANEL
ALBUMIN: 3.1 g/dL — AB (ref 3.5–5.0)
ALK PHOS: 105 U/L (ref 38–126)
ALT: 141 U/L — ABNORMAL HIGH (ref 14–54)
AST: 51 U/L — AB (ref 15–41)
Anion gap: 8 (ref 5–15)
BILIRUBIN TOTAL: 0.9 mg/dL (ref 0.3–1.2)
BUN: 14 mg/dL (ref 6–20)
CALCIUM: 8.8 mg/dL — AB (ref 8.9–10.3)
CO2: 28 mmol/L (ref 22–32)
Chloride: 97 mmol/L — ABNORMAL LOW (ref 101–111)
Creatinine, Ser: 0.35 mg/dL — ABNORMAL LOW (ref 0.44–1.00)
GFR calc Af Amer: >60 mL/min (ref >60)
GLUCOSE: 132 mg/dL — AB (ref 65–99)
Potassium: 3.8 mmol/L (ref 3.5–5.1)
Sodium: 133 mmol/L — ABNORMAL LOW (ref 135–145)
TOTAL PROTEIN: 6 g/dL — AB (ref 6.5–8.1)

## 2017-01-02 LAB — CBC WITH DIFFERENTIAL/PLATELET
BASOS ABS: 0 10*3/uL (ref 0.0–0.1)
BASOS PCT: 0 %
Eosinophils Absolute: 0.4 10*3/uL (ref 0.0–0.7)
Eosinophils Relative: 5 %
HEMATOCRIT: 39.3 % (ref 36.0–46.0)
HEMOGLOBIN: 12.5 g/dL (ref 12.0–15.0)
LYMPHS PCT: 29 %
Lymphs Abs: 2.3 10*3/uL (ref 0.7–4.0)
MCH: 29 pg (ref 26.0–34.0)
MCHC: 31.8 g/dL (ref 30.0–36.0)
MCV: 91.2 fL (ref 78.0–100.0)
MONO ABS: 0.6 10*3/uL (ref 0.1–1.0)
MONOS PCT: 7 %
NEUTROS ABS: 4.7 10*3/uL (ref 1.7–7.7)
NEUTROS PCT: 59 %
Platelets: 250 10*3/uL (ref 150–400)
RBC: 4.31 MIL/uL (ref 3.87–5.11)
RDW: 16.2 % — AB (ref 11.5–15.5)
WBC: 8 10*3/uL (ref 4.0–10.5)

## 2017-01-02 LAB — GLUCOSE, CAPILLARY
GLUCOSE-CAPILLARY: 135 mg/dL — AB (ref 65–99)
GLUCOSE-CAPILLARY: 93 mg/dL (ref 65–99)
Glucose-Capillary: 87 mg/dL (ref 65–99)
Glucose-Capillary: 105 mg/dL — ABNORMAL HIGH (ref 65–99)
Glucose-Capillary: 119 mg/dL — ABNORMAL HIGH (ref 65–99)
Glucose-Capillary: 115 mg/dL — ABNORMAL HIGH (ref 65–99)
Glucose-Capillary: 80 mg/dL (ref 65–99)

## 2017-01-02 LAB — LACTIC ACID, PLASMA: LACTIC ACID, VENOUS: 1.2 mmol/L (ref 0.5–1.9)

## 2017-01-02 MED ORDER — LEVOFLOXACIN 500 MG PO TABS
500.0000 mg | ORAL_TABLET | Freq: Once | ORAL | Status: AC
  Administered 2017-01-02: 500 mg via ORAL
  Filled 2017-01-02: qty 1

## 2017-01-02 MED ORDER — BETHANECHOL CHLORIDE 10 MG PO TABS
5.0000 mg | ORAL_TABLET | Freq: Every day | ORAL | Status: DC
  Administered 2017-01-03 – 2017-01-04 (×2): 5 mg via ORAL
  Filled 2017-01-02 (×2): qty 1

## 2017-01-02 NOTE — Progress Notes (Signed)
Order from MD to downsize trach to [facility] #4 cuffless. Trach tube ordered from materials d/t trach tower on floor being unavailable. Will change trach as soon as appropriate trach tube is available.

## 2017-01-02 NOTE — Progress Notes (Signed)
#  4 [place] cuffless trach tube (with luer lock) received from materials. Patient's husband states he prefers the trach tube with a disposable inner cannula. Charge RT made aware and will try to acquire the trach tube preferred by patient's husband.

## 2017-01-02 NOTE — Progress Notes (Signed)
[facility] PROGRESS NOTE    Progress Note  Judy Spears  HFW:[id number] DOB: 09-19-1950 DOA: 12/05/2016  PCP: Marton Redwood, MD     Brief Narrative:   Judy Spears is an 66 y.o. female  chronic trach,  chronic respiratory failure chr encephalopathy 2/2 to [place] bound and dependant for ADL's recently diagnosed with stg 1 a invasive ductal Breast cancer underwent lumpectomy in 08/03/2016 --currently not on Anastrazole recently right hip fracture [?July this year], either as a sander I and is wheelchair-bound at baseline  presents with increasing purulent sputum production and increased work of breathing.  Note recent hospital stay from 09/24/16 with lethargy-found to have MRSAtracheobornchtiis vs PNA--Rx wih Doxy--course complic by Urinary retenttion  MICROBIOLOGY: MRSA PCR 11/11 >> Positive  Blood Cultures x2 11/11 >> Negative  Tracheal Aspirate Culture 11/11 >> MRSA Urine Streptococcal Antigen 11/11 >> Negative Urine Legionella Antigen 11/11 >> Negative  Urine Culture 11/18 >> Negative  Blood Cultures x2 11/18 >> neg Tracheal Aspirate Culture 11/19 >> MRSA   ANTIBIOTICS: Merrem 11/11 - 11/13 Diflucan 11/18- 11/20 Nystatin Powder 11/20 >> Vancomycin 11/11 - 11/17; 11/18 >> 11/24 Fortaz 11/18 >> 11/24  SIGNIFICANT EVENTS: 11/11 - Admit 11/12 - Worsening work of breathing & desat >> trach changed to cuffed #4 & placed on full vent support 11/16 - Transfer to vent SDU bed 11/17 - Transferred back to ICU w/ fever 11/21 - Trach up-sized by ENT to #6 cuffed  11/22 - Tolerated limited PS weaning 11/24 - To SDU  11/25 - ATC x 8-9 hours  11/26 - ATC 40% 11/30 - currently on pressure control ventilation with pressure control of 12. 11/29 - episode of mucus plugging, back on vent 12/02 - weaned 7 hours on ATC, rested on vent overnight 12/03 - ATC  12/05 - Remains on 21% ATC     Assessment/Plan:    Acute on chronic  respiratory failure with hypoxia ([place]): ATC as tolerated vebt off since 12/3 #6 cuffed--Asking respiratory to downsize and see Saturations above 92% on oxygen we will continue to wean.  Continue suction as needed. Continue Robitussin 200 iv x/day  New Fever starting on 12/30/2016: blood cultures x2 are neg so far to date CXR shows infiltrate /consoloidation--? endobronchial occlusion Will follow with CXR this am 12/6 empirically on IV vancomycin and Zosyn -->levaquin x 1 day by tube and follow cult data Lactic acid not elevated doubtful if infectious  History of encephalitis, arthropod-borne viral Fully dependant for ADL's Await probably CIR vs LTACH Have cancelled palliaitive care meeting for EOL care at this time Continue baclofen 5 mg 4 times daily for spasm, amantadine 100 bid, primidone 50 qid  Hyponatremia: From 137-->133 Might need to cut back free water from 200 every 8-100 every 8 depending on labs again in the morning   Hypokalemia: Repleted orally now resolved.  Pressure injury of skin stage II: Continue accommodation and rotation  Chronic mucus hypersecretion, respiratory Now resolved  Pretension Amlodipine 10 mg by tube daily  Severe protein-calorie malnutrition [name]: less than 60% of standard weight) (HCC) Continue PEG feedings with Osmolite 90 cc/h Protostat 30 mils twice daily  Constipation Start miralax, but would hold cutrretnyl as diarrhea   DVT prophylaxis: heparin Family Communication:husband Disposition Plan/Barrier to D/C: probably [facility] in 1 or 2 days pending availability Code Status:     Code Status Orders  (From admission, onward)        Start     Ordered  12/05/16 1736  Full code  Continuous     12/05/16 1740    Code Status History    Date Active Date Inactive Code Status Order ID Comments User Context   09/30/2016 17:57 10/27/2016 15:11 Full Code [id number]  Flora Lipps Inpatient   09/30/2016 17:57 09/30/2016 17:57  Full Code [id number]  Bary Leriche, PA-C Inpatient   09/24/2016 18:09 09/30/2016 17:30 Full Code [id number]  Omar Person, NP ED   05/06/2016 20:12 05/11/2016 15:40 Full Code [id number]  Corey Harold, NP ED   02/03/2016 14:58 02/25/2016 14:34 Full Code [id number]  Bary Leriche, PA-C Inpatient   12/28/2015 15:58 01/05/2016 03:17 Full Code [id number]  Minor, Grace Bushy, NP Inpatient    Advance Directive Documentation     Most Recent Value  Type of Advance Directive  Living will, Healthcare Power of Attorney  Pre-existing out of facility DNR order (yellow form or pink MOST form)  No data  "MOST" Form in Place?  No data        IV Access:    Peripheral IV   Procedures and diagnostic studies:   Dg Chest Port 1 View  Result Date: 01/02/2017 CLINICAL DATA:  66 year old female with pneumonia. EXAM: PORTABLE CHEST 1 VIEW COMPARISON:  01/01/2017 FINDINGS: Tracheostomy tube in place. Postsurgical changes are noted at the left mid lung. Cardiomediastinal silhouette unchanged in size and morphology. Right basilar airspace opacities stable to slightly decreased from prior examination. Stable appearance of the left lung base with atelectasis and possible small effusion. IMPRESSION: 1. Stable to slightly improved right lower lobe pneumonia. 2. Stable left basilar atelectasis. Question small associated effusion. Superimposed consolidation difficult to exclude in this setting. Electronically Signed   By: Kristopher Oppenheim M.D.   On: 01/02/2017 07:53   Dg Chest Port 1 View  Result Date: 01/01/2017 CLINICAL DATA:  Pneumonia EXAM: PORTABLE CHEST 1 VIEW COMPARISON:  12/31/2016 FINDINGS: Tracheostomy tube in good position. Normal cardiac silhouette. Faint airspace disease in the RIGHT lower lobe is slightly increased in prominence compared to prior. Mild LEFT basilar atelectasis with tenting of the diaphragms unchanged. IMPRESSION: Subtle RIGHT lower lobe pneumonia. Stable LEFT basilar atelectasis. Electronically  Signed   By: Suzy Bouchard M.D.   On: 01/01/2017 10:04     Medical Consultants:    None.  Anti-Infectives:   IV vancomycin and Zosyn  Subjective:   Awake and responsive 3-4 episodes loose stool which seems to occur with abx use in her cae No cp no sob Overall otherwise uneventful night   Objective:    Vitals:   01/02/17 0855 01/02/17 0901 01/02/17 0906 01/02/17 0910  BP: (!) 150/88 (!) 150/88  (!) 150/88  Pulse: 78   76  Resp: (!) 29   (!) 29  Temp:      TempSrc:      SpO2: 98%  99% 98%  Weight:      Height:        Intake/Output Summary (Last 24 hours) at 01/02/2017 0917 Last data filed at 01/02/2017 0400 Gross per 24 hour  Intake 2780 ml  Output 600 ml  Net 2180 ml   Filed Weights   01/01/17 0532 01/02/17 0414 01/02/17 0504  Weight: 46.6 kg (102 lb 11.8 oz) 47 kg (103 lb 9.9 oz) 45 kg (99 lb 3.3 oz)    Exam: General exam: Cachectic appearing trach in place, flexure contractures noted in Ue's  Respiratory system: Good air movement and clear to auscultation post lung fields Cardiovascular  system: Regular rate and rhythm with positive S1-S2 no murmurs rubs gallops Gastrointestinal system: Abdomen is soft nontender nondistended with PEG tube does not look erythematous. Extremities: Lower extremity edema but deformed. Skin: Stage II sacral decubitus   Data Reviewed:    Labs: Basic Metabolic Panel: Recent Labs  Lab 12/28/16 0432 12/28/16 1506 12/31/16 0455 01/01/17 0252 01/02/17 0430  NA 133* 135 141 137 133*  K 4.0 3.7 3.5 3.2* 3.8  CL 93* 97* 101 99* 97*  CO2 29 28 30 28 28   GLUCOSE 96 118* 85 87 132*  BUN 20 19 24* 23* 14  CREATININE 0.33* 0.34* 0.40* 0.33* 0.35*  CALCIUM 9.1 8.8* 9.0 8.9 8.8*   GFR Estimated Creatinine Clearance: 49.8 mL/min (A) (by C-G formula based on SCr of 0.35 mg/dL (L)). Liver Function Tests: Recent Labs  Lab 01/02/17 0430  AST 51*  ALT 141*  ALKPHOS 105  BILITOT 0.9  PROT 6.0*  ALBUMIN 3.1*   No results  for input(s): LIPASE, AMYLASE in the last 168 hours. No results for input(s): AMMONIA in the last 168 hours. Coagulation profile No results for input(s): INR, PROTIME in the last 168 hours.  CBC: Recent Labs  Lab 12/28/16 0432 12/29/16 0604 12/31/16 0455 01/02/17 0430  WBC 7.2 8.2 8.3 8.0  NEUTROABS  --   --   --  4.7  HGB 13.7 13.3 13.0 12.5  HCT 43.2 41.4 41.3 39.3  MCV 90.9 90.6 92.4 91.2  PLT 282 285 287 250   Cardiac Enzymes: No results for input(s): CKTOTAL, CKMB, CKMBINDEX, TROPONINI in the last 168 hours. BNP (last 3 results) No results for input(s): PROBNP in the last 8760 hours. CBG: Recent Labs  Lab 01/01/17 1623 01/01/17 2042 01/02/17 0121 01/02/17 0413 01/02/17 0756  GLUCAP 107* 120* 87 105* 119*   D-Dimer: No results for input(s): DDIMER in the last 72 hours. Hgb A1c: No results for input(s): HGBA1C in the last 72 hours. Lipid Profile: No results for input(s): CHOL, HDL, LDLCALC, TRIG, CHOLHDL, LDLDIRECT in the last 72 hours. Thyroid function studies: No results for input(s): TSH, T4TOTAL, T3FREE, THYROIDAB in the last 72 hours.  Invalid input(s): FREET3 Anemia work up: No results for input(s): VITAMINB12, FOLATE, FERRITIN, TIBC, IRON, RETICCTPCT in the last 72 hours. Sepsis Labs: Recent Labs  Lab 12/28/16 0432 12/29/16 0604 12/31/16 0455 01/02/17 0430  WBC 7.2 8.2 8.3 8.0  LATICACIDVEN  --   --   --  1.2   Microbiology Recent Results (from the past 240 hour(s))  Culture, blood (Routine X 2) w Reflex to ID Panel     Status: None (Preliminary result)   Collection Time: 12/31/16  9:22 AM  Result Value Ref Range Status   Specimen Description BLOOD LEFT HAND  Final   Special Requests IN PEDIATRIC BOTTLE Blood Culture adequate volume  Final   Culture NO GROWTH 1 DAY  Final   Report Status PENDING  Incomplete  Culture, blood (Routine X 2) w Reflex to ID Panel     Status: None (Preliminary result)   Collection Time: 12/31/16  9:25 AM  Result  Value Ref Range Status   Specimen Description BLOOD RIGHT HAND  Final   Special Requests   Final    IN PEDIATRIC BOTTLE Blood Culture results may not be optimal due to an excessive volume of blood received in culture bottles   Culture NO GROWTH 1 DAY  Final   Report Status PENDING  Incomplete     Medications:   .  amantadine  150 mg Per Tube BID WC  . amLODipine  10 mg Per Tube Daily  . atorvastatin  20 mg Per Tube QHS  . baclofen  5 mg Per Tube QID  . bethanechol  5 mg Oral BID  . calcium-vitamin D  2 tablet Oral Q breakfast  . chlorhexidine  15 mL Mouth Rinse BID  . cholecalciferol  2,000 Units Oral Daily  . docusate  100 mg Per Tube Daily  . feeding supplement (PRO-STAT SUGAR FREE 64)  30 mL Per Tube BID  . free water  200 mL Per Tube Q8H  . gabapentin  100 mg Per Tube BID  . guaiFENesin  10 mL Per Tube QID  . heparin  5,000 Units Subcutaneous Q8H  . mouth rinse  15 mL Mouth Rinse q12n4p  . Melatonin  6 mg Per Tube QPM  . nystatin   Topical BID  . pantoprazole sodium  40 mg Per Tube Q24H  . polyethylene glycol  17 g Oral BID  . potassium chloride  40 mEq Oral Daily  . primidone  50 mg Per Tube QID  . saccharomyces boulardii  250 mg Per Tube BID  . sodium chloride HYPERTONIC  4 mL Nebulization BID  . valACYclovir  500 mg Per Tube Daily  . vitamin C  500 mg Per Tube Daily   Continuous Infusions: . feeding supplement (OSMOLITE 1.5 CAL) 1,000 mL (01/02/17 0505)  . piperacillin-tazobactam (ZOSYN)  IV 3.375 g (01/02/17 0900)  . vancomycin 750 mg (01/02/17 0853)      LOS: 28 days   Verneita Griffes, MD [facility]  *Please refer to [email or web address], password [email or web address] to get updated schedule on who will round on this patient, as hospitalists switch teams weekly. If 7PM-7AM, please contact night-coverage at [email or web address], password [email or web address] for any overnight needs.  01/02/2017, 9:17 AM

## 2017-01-03 LAB — COMPREHENSIVE METABOLIC PANEL
ALT: 114 U/L — ABNORMAL HIGH (ref 14–54)
AST: 36 U/L (ref 15–41)
Albumin: 3.3 g/dL — ABNORMAL LOW (ref 3.5–5.0)
Alkaline Phosphatase: 104 U/L (ref 38–126)
Anion gap: 10 (ref 5–15)
BUN: 18 mg/dL (ref 6–20)
CHLORIDE: 96 mmol/L — AB (ref 101–111)
CO2: 27 mmol/L (ref 22–32)
CREATININE: 0.34 mg/dL — AB (ref 0.44–1.00)
Calcium: 9.2 mg/dL (ref 8.9–10.3)
GLUCOSE: 100 mg/dL — AB (ref 65–99)
POTASSIUM: 3.9 mmol/L (ref 3.5–5.1)
Sodium: 133 mmol/L — ABNORMAL LOW (ref 135–145)
TOTAL PROTEIN: 6.4 g/dL — AB (ref 6.5–8.1)
Total Bilirubin: 0.7 mg/dL (ref 0.3–1.2)

## 2017-01-03 LAB — GLUCOSE, CAPILLARY
GLUCOSE-CAPILLARY: 104 mg/dL — AB (ref 65–99)
GLUCOSE-CAPILLARY: 114 mg/dL — AB (ref 65–99)
GLUCOSE-CAPILLARY: 125 mg/dL — AB (ref 65–99)
GLUCOSE-CAPILLARY: 130 mg/dL — AB (ref 65–99)
GLUCOSE-CAPILLARY: 74 mg/dL (ref 65–99)
Glucose-Capillary: 116 mg/dL — ABNORMAL HIGH (ref 65–99)
Glucose-Capillary: 100 mg/dL — ABNORMAL HIGH (ref 65–99)
Glucose-Capillary: 131 mg/dL — ABNORMAL HIGH (ref 65–99)
Glucose-Capillary: 124 mg/dL — ABNORMAL HIGH (ref 65–99)
Glucose-Capillary: 116 mg/dL — ABNORMAL HIGH (ref 65–99)
Glucose-Capillary: 102 mg/dL — ABNORMAL HIGH (ref 65–99)

## 2017-01-03 LAB — CBC WITH DIFFERENTIAL/PLATELET
Basophils Absolute: 0 10*3/uL (ref 0.0–0.1)
Basophils Relative: 0 %
EOS ABS: 0.3 10*3/uL (ref 0.0–0.7)
EOS PCT: 4 %
HCT: 41.1 % (ref 36.0–46.0)
Hemoglobin: 13.4 g/dL (ref 12.0–15.0)
LYMPHS ABS: 1.8 10*3/uL (ref 0.7–4.0)
LYMPHS PCT: 23 %
MCH: 29.5 pg (ref 26.0–34.0)
MCHC: 32.6 g/dL (ref 30.0–36.0)
MCV: 90.3 fL (ref 78.0–100.0)
MONO ABS: 1 10*3/uL (ref 0.1–1.0)
Monocytes Relative: 13 %
Neutro Abs: 4.6 10*3/uL (ref 1.7–7.7)
Neutrophils Relative %: 60 %
PLATELETS: 255 10*3/uL (ref 150–400)
RBC: 4.55 MIL/uL (ref 3.87–5.11)
RDW: 16.2 % — AB (ref 11.5–15.5)
WBC: 7.7 10*3/uL (ref 4.0–10.5)

## 2017-01-03 MED ORDER — FREE WATER
100.0000 mL | Freq: Three times a day (TID) | Status: DC
  Administered 2017-01-03 – 2017-01-04 (×3): 100 mL

## 2017-01-03 NOTE — Progress Notes (Signed)
  Speech Language Pathology Treatment: Nada Boozer Speaking valve  Patient Details Name: Judy Spears MRN: [id number] DOB: 1950/02/02 Today's Date: 01/03/2017 Time: 7829-5621 SLP Time Calculation (min) (ACUTE ONLY): 30 min  Assessment / Plan / Recommendation Clinical Impression  Pt has significantly improved PMV tolerance given her new #4 cuffless trach. She wore it under direct SLP supervision for 20+ minutes with no back pressure. Her RR would intermittently rise into the 30s before quickly returning to baseline, but her husband reports that this is baseline for her. Max cues were provided for increased vocal intensity at the word level, and family present says that her vocal quality is much softer than at baseline. Recommend additional SLP f/u to maximize functional communication.   HPI HPI: Pt is a 66 yo female admitted with acute on chronic respiratory failure from HCAP. 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 Had initial improvement and transferred to vent SDU. Developed progressive fever, increase WOB, and rash around G tube site. Transferred back to ICU. At baseline she has a #4 cuffless trach that she keeps capped. She has used PMV previously with this size trach with good tolerance. She required a #6 cuffed trach this admission due to leaking on the vent. She is well known to the SLP team, still being seen for OP SLP with goals targeting simple cognitive communication, AAC use, articulation/voicing, and dysphagia with exercises and ice chip trials.      SLP Plan  Continue with current plan of care       Recommendations         Patient may use Passy-Muir Speech Valve: During all waking hours (remove during sleep);During all therapies with supervision PMSV Supervision: Full         Follow up Recommendations: Inpatient Rehab SLP Visit Diagnosis: Aphonia (R49.1) Plan: Continue with current plan of care       GO                 Germain Osgood 01/03/2017, 11:22 AM  Germain Osgood, M.A. CCC-SLP [phone]

## 2017-01-03 NOTE — Progress Notes (Signed)
[facility]   Physician Requesting Consult:Peter Messick, M.D. 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 Known chronic hypoxic respiratory failure. Recently diagnosed with breast cancer having undergone lumpectomy summer 2018. Lives at home with family and does not at times as well as make small attempts to verbalize. Recently she has been found to have a broken right hip and is wheelchair-bound at baseline. Presents with 1 day history of increasing purulent sputum production and increased work of breathing. Her mental status has been slightly worse than usual. No subjective fever, chills, or sweats. No recent sick contacts that the husband reports. Patient has been more restless at home recently. Her saturations have dropped into the 70s at times today prompting her husband to bring her to the emergency room. Through imaging she was subsequently found to have bibasilar infiltrates suspicious for healthcare associated pneumonia and was started on broad-spectrum antiviral therapy.  Subjective:   No acute changes actually looks stronger and close to baseline  FiO2 (%):  [28 %] 28 %  Temp:  [97.5 F (36.4 C)-98.5 F (36.9 C)] 97.7 F (36.5 C) (12/10 0833) Pulse Rate:  [73-81] 81 (12/10 0942) Resp:  [23-36] 24 (12/10 0942) BP: (105-164)/(72-125) 130/87 (12/10 0942) SpO2:  [97 %-100 %] 99 % (12/10 0942) FiO2 (%):  [28 %] 28 % (12/10 0942) Weight:  [100 lb 12 oz (45.7 kg)] 100 lb 12 oz (45.7 kg) (12/10 0557)   General: Chronically ill frail 66 year old female currently sitting up in chair in no acute distress. HEENT: Normocephalic atraumatic.  Her #4 uncuffed trach is unremarkable.  She has a Passy-Muir valve in place, she is able to phonate some however her vocal quality is soft and weak Pulmonary: Scattered rhonchi no accessory muscle use. Cardiac: Regular rate and  rhythm without murmur rub or gallop. Abdomen: Soft, not tender.  PEG tube unremarkable. Extremities: Generalized weakness, no edema, brisk cap refill Neuro: Awake, interactive, oriented to person.  Moves all extremities, generalized ataxia noted   CBC Latest Ref Rng & Units 01/03/2017 01/02/2017 12/31/2016  WBC 4.0 - 10.5 K/uL 7.7 8.0 8.3  Hemoglobin 12.0 - 15.0 g/dL 13.4 12.5 13.0  Hematocrit 36.0 - 46.0 % 41.1 39.3 41.3  Platelets 150 - 400 K/uL 255 250 287    BMP Latest Ref Rng & Units 01/03/2017 01/02/2017 01/01/2017  Glucose 65 - 99 mg/dL 100(H) 132(H) 87  BUN 6 - 20 mg/dL 18 14 23(H)  Creatinine 0.44 - 1.00 mg/dL 0.34(L) 0.35(L) 0.33(L)  BUN/Creat Ratio 12 - 28 - - -  Sodium 135 - 145 mmol/L 133(L) 133(L) 137  Potassium 3.5 - 5.1 mmol/L 3.9 3.8 3.2(L)  Chloride 101 - 111 mmol/L 96(L) 97(L) 99(L)  CO2 22 - 32 mmol/L _0 Calcium 8.9 - 10.3 mg/dL 9.2 8.8(L) 8.9    Hepatic Function Latest Ref Rng & Units 01/03/2017 01/02/2017 12/20/2016  Total Protein 6.5 - 8.1 g/dL 6.4(L) 6.0(L) -  Albumin 3.5 - 5.0 g/dL 3.3(L) 3.1(L) 2.9(L)  AST 15 - 41 U/L 36 51(H) -  ALT 14 - 54 U/L 114(H) 141(H) -  Alk Phosphatase 38 - 126 U/L 104 105 -  Total Bilirubin 0.3 - 1.2 mg/dL 0.7 0.9 -  Bilirubin, Direct 0.0 - 0.3 mg/dL - - -   IMAGING/STUDIES: PORT CXR 11/11: Previously reviewed by me. And reticular opacities predominantly in the lung bases with some areas of alveolar filling  suggestive of a pneumonitis. PORT CXR 11/19: Previously reviewed by me. Questionable new right lower lung zone opacity. No pleural effusion appreciated. RUQ U/S 11/19:Unremarkable right upper quadrant ultrasound. CT CHEST W/O 11/21:  Previously reviewed by me. Focal dependent opacity posterior segment right lower lobe. Mucous plugging within left lower lobe noted. No pleural effusion. No pneumothorax. PORT CXR 11/22:  Previously reviewed by me. Questionable progression of left basilar opacity. Low lung volumes today.  No new focal opacity appreciated. Still retains good visualization of left hemidiaphragm. Tracheostomy in place.  MICROBIOLOGY: MRSA PCR 11/11 >>  Positive  Blood Cultures x2 11/11 >>  Negative  Tracheal Aspirate Culture 11/11 >>  MRSA Urine Streptococcal Antigen 11/11 >>  Negative Urine Legionella Antigen 11/11 >>  Negative  Urine Culture 11/18 >>  Negative  Blood Cultures x2 11/18 >> neg Tracheal Aspirate Culture 11/19 >>  MRSA   ANTIBIOTICS: Merrem 11/11 - 11/13 Diflucan 11/18- 11/20 Nystatin Powder 11/20 >> Vancomycin 11/11 - 11/17; 11/18 >> 11/24 Fortaz 11/18 >> 11/24  LINES/TUBES: Lurline Idol #6 (ENT) >>>  for uncuffed trach 12/10 PEG >>> Foley >>>11/26 PIV  SIGNIFICANT EVENTS: 11/11 - Admit 11/12 - Worsening work of breathing & desat >> trach changed to cuffed #4 & placed on full vent support 11/16 - Transfer to vent SDU bed 11/17 - Transferred back to ICU w/ fever 11/21 - Trach up-sized by ENT to #6 cuffed  11/22 - Tolerated limited PS weaning 11/24 - To SDU  11/25 - ATC x 8-9 hours  11/26 - ATC 40% 11/30 - currently on pressure control ventilation with pressure control of 12. 11/29 - episode of mucus plugging, back on vent 12/02 - weaned 7 hours on ATC, rested on vent overnight 12/03 - ATC  12/05 - Remains on 21% ATC  ASSESSMENT/PLAN:   A: Acute on chronic hypoxic respiratory failure requiring tracheostomy and mechanical ventilatory support.   Chronic mucous plugging Severe malnutrition  History of encephalitis - able to follow commands / communicate Urinary Retention Abnormal renal fxn that resolved, error in labs? Mild loose stools-resolved  Constipation - last BM 12/2   Hypokalemia  Hyponatremia  Hip Fracture - recent, not repaired  Discussion: Now status post treatment for healthcare associated pneumonia, complicated by prolonged ventilator dependence now back to baseline size for uncuffed trach.  She is doing well from a pulmonary standpoint.   Phonating some, about at her baseline level as far as that is concerned.  Has intermittent secretions which still requires suctioning.  Plan Agree w/ transfer to [facility] For LP and MRI in the future Continue routine tracheostomy care Will plan on tracheostomy clinic follow-up in 12 weeks, family has contact number and will contact if needs to be sooner  Erick Colace ACNP-BC [place] Pager # [phone] OR # [phone] if no answer

## 2017-01-03 NOTE — Progress Notes (Signed)
[facility]  Note from her outpatient pulmonary physician  She really looks dramatically better than the last time I saw her in the ICU.  Would note on discharge to the [facility] the following for her pulmonary toilette (very important considering her repeated episodes of pneumonia)  Needs to have mechanical chest percussion at least twice per day (she can use her therapy vest) Needs to have hypertonic saline twice per day Needs to have deep suctioning twice per day  Roselie Awkward, MD [facility] Pager: [phone] Cell: [phone] After 3pm or if no response, call [phone]

## 2017-01-03 NOTE — Progress Notes (Signed)
Attempted to call Tammy @ the [facility] at 0930. No answer left message. No return call. Notified Kristie Nurse case manager to make her aware.

## 2017-01-03 NOTE — Progress Notes (Signed)
Unable to obtain #4 shiley with disposable inner cannula. Patient's husband agrees to use the available #4 shiley trach. Trach tube downsized from a #6 shiley cuffed to a #4 shiley cuffless per Dr Verlon Au with assistance from [place] [facility]. Trach tube placed with obturator without difficulty. Patient has no bleeding from trach site. Positive color change on CO2 detector and BBS auscultated. Trach secured with new trach tie and placed back on 28% trach collar. Patient's vital signs remained within normal limits throughout procedure. Husband in room and by bedside entire time.

## 2017-01-03 NOTE — Progress Notes (Signed)
Pt out of bed in chair. 1200 Chest PT held at this time. Will perform when patient is back in bed.

## 2017-01-03 NOTE — Progress Notes (Signed)
[facility] PROGRESS NOTE    Progress Note  Kimyetta Flott Xxxhagan  ZOX:[id number] DOB: 1951/01/08 DOA: 12/05/2016  PCP: Marton Redwood, MD     Brief Narrative:   Judy Spears is an 66 y.o. female  chronic trach,  chronic respiratory failure chr encephalopathy 2/2 to [place] bound and dependant for ADL's recently diagnosed with stg 1 a invasive ductal Breast cancer underwent lumpectomy in 08/03/2016 --currently not on Anastrazole recently right hip fracture [?July this year], either as a sander I and is wheelchair-bound at baseline  presents with increasing purulent sputum production and increased work of breathing.  Note recent hospital stay from 09/24/16 with lethargy-found to have MRSAtracheobornchtiis vs PNA--Rx wih Doxy--course complic by Urinary retenttion  MICROBIOLOGY: MRSA PCR 11/11 >> Positive  Blood Cultures x2 11/11 >> Negative  Tracheal Aspirate Culture 11/11 >> MRSA Urine Streptococcal Antigen 11/11 >> Negative Urine Legionella Antigen 11/11 >> Negative  Urine Culture 11/18 >> Negative  Blood Cultures x2 11/18 >> neg Tracheal Aspirate Culture 11/19 >> MRSA   ANTIBIOTICS: Merrem 11/11 - 11/13 Diflucan 11/18- 11/20 Nystatin Powder 11/20 >> Vancomycin 11/11 - 11/17; 11/18 >> 11/24 Fortaz 11/18 >> 11/24  SIGNIFICANT EVENTS: 11/11 - Admit 11/12 - Worsening work of breathing & desat >> trach changed to cuffed #4 & placed on full vent support 11/16 - Transfer to vent SDU bed 11/17 - Transferred back to ICU w/ fever 11/21 - Trach up-sized by ENT to #6 cuffed  11/22 - Tolerated limited PS weaning 11/24 - To SDU  11/25 - ATC x 8-9 hours  11/26 - ATC 40% 11/30 - currently on pressure control ventilation with pressure control of 12. 11/29 - episode of mucus plugging, back on vent 12/02 - weaned 7 hours on ATC, rested on vent overnight 12/03 - ATC  12/05 - Remains on 21% ATC     Assessment/Plan:    Acute on chronic  respiratory failure with hypoxia ([place]): ATC as tolerated vebt off since 12/3 #6 cuffed--Asking respiratory to downsize and see Saturations above 92% on oxygen we will continue to wean.  Continue suction as needed. Continue Robitussin 200 iv x/day  New Fever starting on 12/30/2016: blood cultures x2 are neg so far to date CXR shows infiltrate /consoloidation--? endobronchial occlusion 12/6 empirically on IV vancomycin and Zosyn -->levaquin x 1 day by tube Suspect retrospectively did have some mild pneumonitis?--no further abx and improved Prn CXR  History of encephalitis, arthropod-borne viral Fully dependant for ADL's Await probably CIR vs LTACH Have cancelled palliaitive care meeting for EOL care at this time Continue baclofen 5 mg 4 times daily for spasm, amantadine 100 bid, primidone 50 qid  Hyponatremia: From 137-->133  cut back free water from 200 every 8-100 every 8 depending on labs again in the morning   Hypokalemia: Repleted orally now resolved.  Pressure injury of skin stage II: Continue accommodation and rotation Seemed stable at exam 12/10  Chronic mucus hypersecretion, respiratory Now resolved  Pretension Amlodipine 10 mg by tube daily  Severe protein-calorie malnutrition [name]: less than 60% of standard weight) (HCC) Continue PEG feedings with Osmolite 90 cc/h Protostat 30 mils twice daily  Constipation Start miralax, but would hold cutrretnyl as diarrhea   DVT prophylaxis: heparin Family Communication:husband Disposition Plan/Barrier to D/C: probably [facility] in 1 or 2 days pending availability Code Status:     Code Status Orders  (From admission, onward)        Start     Ordered  12/05/16 1736  Full code  Continuous     12/05/16 1740    Code Status History    Date Active Date Inactive Code Status Order ID Comments User Context   09/30/2016 17:57 10/27/2016 15:11 Full Code [id number]  Flora Lipps Inpatient   09/30/2016 17:57  09/30/2016 17:57 Full Code [id number]  Bary Leriche, PA-C Inpatient   09/24/2016 18:09 09/30/2016 17:30 Full Code [id number]  Omar Person, NP ED   05/06/2016 20:12 05/11/2016 15:40 Full Code [id number]  Corey Harold, NP ED   02/03/2016 14:58 02/25/2016 14:34 Full Code [id number]  Bary Leriche, PA-C Inpatient   12/28/2015 15:58 01/05/2016 03:17 Full Code [id number]  Minor, Grace Bushy, NP Inpatient    Advance Directive Documentation     Most Recent Value  Type of Advance Directive  Living will, Healthcare Power of Attorney  Pre-existing out of facility DNR order (yellow form or pink MOST form)  No data  "MOST" Form in Place?  No data        IV Access:    Peripheral IV   Procedures and diagnostic studies:   Dg Chest Port 1 View  Result Date: 01/02/2017 CLINICAL DATA:  66 year old female with pneumonia. EXAM: PORTABLE CHEST 1 VIEW COMPARISON:  01/01/2017 FINDINGS: Tracheostomy tube in place. Postsurgical changes are noted at the left mid lung. Cardiomediastinal silhouette unchanged in size and morphology. Right basilar airspace opacities stable to slightly decreased from prior examination. Stable appearance of the left lung base with atelectasis and possible small effusion. IMPRESSION: 1. Stable to slightly improved right lower lobe pneumonia. 2. Stable left basilar atelectasis. Question small associated effusion. Superimposed consolidation difficult to exclude in this setting. Electronically Signed   By: Kristopher Oppenheim M.D.   On: 01/02/2017 07:53     Medical Consultants:    None.  Anti-Infectives:   IV vancomycin and Zosyn  Subjective:    2 stools watery -4 yesterday No cp No fever  No abd pain Still on minimal oxygen settings  Objective:    Vitals:   01/03/17 0557 01/03/17 0833 01/03/17 0917 01/03/17 0942  BP:  123/79 130/87 130/87  Pulse:   81 81  Resp:  (!) 25 (!) 26 (!) 24  Temp:  97.7 F (36.5 C)    TempSrc:  Oral    SpO2:  97% 99% 99%  Weight: 45.7 kg (100  lb 12 oz)     Height:        Intake/Output Summary (Last 24 hours) at 01/03/2017 1128 Last data filed at 01/03/2017 0600 Gross per 24 hour  Intake 1570 ml  Output 750 ml  Net 820 ml   Filed Weights   01/02/17 0414 01/02/17 0504 01/03/17 0557  Weight: 47 kg (103 lb 9.9 oz) 45 kg (99 lb 3.3 oz) 45.7 kg (100 lb 12 oz)    Exam: General exam: Cachectic appearing trach in place, flexure contractures noted in UE's  Respiratory system: Good air movement and clear to auscultation post lung fields Cardiovascular system: Regular rate and rhythm with positive S1-S2 no murmurs rubs gallops Gastrointestinal system: Abdomen is soft nontender nondistended with PEG tube does not look erythematous. Extremities: Lower extremity edema but deformed. Skin: Stage II sacral decubitus examined on 12/10 -no current basis for comparison   Data Reviewed:    Labs: Basic Metabolic Panel: Recent Labs  Lab 12/28/16 1506 12/31/16 0455 01/01/17 0252 01/02/17 0430 01/03/17 0351  NA 135 141 137 133* 133*  K 3.7 3.5 3.2* 3.8  3.9  CL 97* 101 99* 97* 96*  CO2 28 30 28 28 27   GLUCOSE 118* 85 87 132* 100*  BUN 19 24* 23* 14 18  CREATININE 0.34* 0.40* 0.33* 0.35* 0.34*  CALCIUM 8.8* 9.0 8.9 8.8* 9.2   GFR Estimated Creatinine Clearance: 50.6 mL/min (A) (by C-G formula based on SCr of 0.34 mg/dL (L)). Liver Function Tests: Recent Labs  Lab 01/02/17 0430 01/03/17 0351  AST 51* 36  ALT 141* 114*  ALKPHOS 105 104  BILITOT 0.9 0.7  PROT 6.0* 6.4*  ALBUMIN 3.1* 3.3*   No results for input(s): LIPASE, AMYLASE in the last 168 hours. No results for input(s): AMMONIA in the last 168 hours. Coagulation profile No results for input(s): INR, PROTIME in the last 168 hours.  CBC: Recent Labs  Lab 12/28/16 0432 12/29/16 0604 12/31/16 0455 01/02/17 0430 01/03/17 0351  WBC 7.2 8.2 8.3 8.0 7.7  NEUTROABS  --   --   --  4.7 4.6  HGB 13.7 13.3 13.0 12.5 13.4  HCT 43.2 41.4 41.3 39.3 41.1  MCV 90.9 90.6  92.4 91.2 90.3  PLT 282 285 287 250 255   Cardiac Enzymes: No results for input(s): CKTOTAL, CKMB, CKMBINDEX, TROPONINI in the last 168 hours. BNP (last 3 results) No results for input(s): PROBNP in the last 8760 hours. CBG: Recent Labs  Lab 01/02/17 1940 01/02/17 2322 01/03/17 0321 01/03/17 0418 01/03/17 0817  GLUCAP 80 93 74 131* 124*   D-Dimer: No results for input(s): DDIMER in the last 72 hours. Hgb A1c: No results for input(s): HGBA1C in the last 72 hours. Lipid Profile: No results for input(s): CHOL, HDL, LDLCALC, TRIG, CHOLHDL, LDLDIRECT in the last 72 hours. Thyroid function studies: No results for input(s): TSH, T4TOTAL, T3FREE, THYROIDAB in the last 72 hours.  Invalid input(s): FREET3 Anemia work up: No results for input(s): VITAMINB12, FOLATE, FERRITIN, TIBC, IRON, RETICCTPCT in the last 72 hours. Sepsis Labs: Recent Labs  Lab 12/29/16 0604 12/31/16 0455 01/02/17 0430 01/03/17 0351  WBC 8.2 8.3 8.0 7.7  LATICACIDVEN  --   --  1.2  --    Microbiology Recent Results (from the past 240 hour(s))  Culture, blood (Routine X 2) w Reflex to ID Panel     Status: None (Preliminary result)   Collection Time: 12/31/16  9:22 AM  Result Value Ref Range Status   Specimen Description BLOOD LEFT HAND  Final   Special Requests IN PEDIATRIC BOTTLE Blood Culture adequate volume  Final   Culture NO GROWTH 2 DAYS  Final   Report Status PENDING  Incomplete  Culture, blood (Routine X 2) w Reflex to ID Panel     Status: None (Preliminary result)   Collection Time: 12/31/16  9:25 AM  Result Value Ref Range Status   Specimen Description BLOOD RIGHT HAND  Final   Special Requests   Final    IN PEDIATRIC BOTTLE Blood Culture results may not be optimal due to an excessive volume of blood received in culture bottles   Culture NO GROWTH 2 DAYS  Final   Report Status PENDING  Incomplete     Medications:   . amantadine  150 mg Per Tube BID WC  . amLODipine  10 mg Per Tube  Daily  . atorvastatin  20 mg Per Tube QHS  . baclofen  5 mg Per Tube QID  . bethanechol  5 mg Oral Daily  . calcium-vitamin D  2 tablet Oral Q breakfast  . chlorhexidine  15  mL Mouth Rinse BID  . cholecalciferol  2,000 Units Oral Daily  . docusate  100 mg Per Tube Daily  . feeding supplement (PRO-STAT SUGAR FREE 64)  30 mL Per Tube BID  . free water  200 mL Per Tube Q8H  . gabapentin  100 mg Per Tube BID  . guaiFENesin  10 mL Per Tube QID  . heparin  5,000 Units Subcutaneous Q8H  . mouth rinse  15 mL Mouth Rinse q12n4p  . Melatonin  6 mg Per Tube QPM  . nystatin   Topical BID  . pantoprazole sodium  40 mg Per Tube Q24H  . polyethylene glycol  17 g Oral BID  . potassium chloride  40 mEq Oral Daily  . primidone  50 mg Per Tube QID  . saccharomyces boulardii  250 mg Per Tube BID  . sodium chloride HYPERTONIC  4 mL Nebulization BID  . valACYclovir  500 mg Per Tube Daily  . vitamin C  500 mg Per Tube Daily   Continuous Infusions: . feeding supplement (OSMOLITE 1.5 CAL) 1,000 mL (01/03/17 0551)      LOS: 29 days   Verneita Griffes, MD [facility]  *Please refer to [email or web address], password [email or web address] to get updated schedule on who will round on this patient, as hospitalists switch teams weekly. If 7PM-7AM, please contact night-coverage at [email or web address], password [email or web address] for any overnight needs.  01/03/2017, 11:28 AM

## 2017-01-03 NOTE — Progress Notes (Signed)
Physical Therapy Treatment Patient Details Name: Judy Spears MRN: [id number] DOB: 1950-06-21 Today's Date: 01/03/2017    History of Present Illness 66 yo female with acute on chronic respiratory failure from HCAP. PMHx of breast cancer in Summer 2018, Rt hip fx (which has not been repaired), [place] encephalitis. Had initial improvement and transferred to vent SDU. Developed progressive fever, increase WOB, and rash around G tube site. Transferred back to ICU.    PT Comments    Pt admitted with above diagnosis. Pt currently with functional limitations due to balance and endurance deficits. Pt continues to make small gains with mobility with incr initiation of movement each treatment.  Pt husband and caregiver present today and were active in treatment.  Assisted pt to wheelchair with pt positioned well once in chair. Will continue acute PT.   Pt will benefit from skilled PT to increase their independence and safety with mobility to allow discharge to the venue listed below.     Follow Up Recommendations  CIR     Equipment Recommendations  (Consider lift equipment)    Recommendations for Other Services       Precautions / Restrictions Precautions Precautions: Fall Precaution Comments: trach, PEG  Restrictions Weight Bearing Restrictions: No    Mobility  Bed Mobility Overal bed mobility: Needs Assistance Bed Mobility: Rolling;Supine to Sit Rolling: +2 for physical assistance;Total assist Sidelying to sit: +2 for physical assistance;Total assist Supine to sit: +2 for physical assistance;Total assist Sit to supine: Total assist;+2 for physical assistance   General bed mobility comments: Pt worked on supine on bent elbows to sit with pt assisting 10-15% to pull forward once total assist to get in the position.  Total assist to come flat supine to sit EOB.    Transfers Overall transfer level: Needs assistance Equipment used: (pad) Transfers: Squat Pivot  Transfers(with pad) Sit to Stand: Total assist;+2 safety/equipment(with pad)   Squat pivot transfers: Total assist;+2 safety/equipment     General transfer comment: transferred bed to her w/c with bed pad, pt minimally placing weight through feet  Ambulation/Gait             General Gait Details: non ambulatory   Stairs            Wheelchair Mobility    Modified Rankin (Stroke Patients Only)       Balance Overall balance assessment: Needs assistance Sitting-balance support: Feet supported Sitting balance-Leahy Scale: Poor Sitting balance - Comments: L side lean, worked on weight bearing on each elbow and returning to sit, max assist from L with pt assisting 15%, min assist from R Postural control: Posterior lean;Left lateral lean Standing balance support: No upper extremity supported;During functional activity Standing balance-Leahy Scale: Poor Standing balance comment: Pt needed total assist to stand with use of pad for forward lean.                             Cognition Arousal/Alertness: Awake/alert Behavior During Therapy: WFL for tasks assessed/performed Overall Cognitive Status: Difficult to assess                                 General Comments: follows commands with increased time      Exercises General Exercises - Upper Extremity Shoulder Flexion: AAROM;Supine;10 reps Elbow Flexion: AAROM;10 reps;Supine;Right Elbow Extension: AAROM;Both;10 reps;Supine General Exercises - Lower Extremity Long Arc Quad: AROM;Both;10 reps;Seated Heel  Slides: AAROM;Both;10 reps;Supine Hip ABduction/ADduction: AAROM;Both;10 reps;Supine    General Comments General comments (skin integrity, edema, etc.): Positioned well in chair       Pertinent Vitals/Pain Pain Assessment: Faces Faces Pain Scale: Hurts even more Pain Location: Rt LE  Pain Descriptors / Indicators: Spasm;Restless;Grimacing Pain Intervention(s): Limited activity within  patient's tolerance;Monitored during session;Premedicated before session;Repositioned  VSS  Home Living                      Prior Function            PT Goals (current goals can now be found in the care plan section) Acute Rehab PT Goals Patient Stated Goal: none stated Progress towards PT goals: Progressing toward goals    Frequency    Min 2X/week      PT Plan Current plan remains appropriate    Co-evaluation              AM-PAC PT "6 Clicks" Daily Activity  Outcome Measure  Difficulty turning over in bed (including adjusting bedclothes, sheets and blankets)?: Unable Difficulty moving from lying on back to sitting on the side of the bed? : Unable Difficulty sitting down on and standing up from a chair with arms (e.g., wheelchair, bedside commode, etc,.)?: Unable Help needed moving to and from a bed to chair (including a wheelchair)?: Total Help needed walking in hospital room?: Total Help needed climbing 3-5 steps with a railing? : Total 6 Click Score: 6    End of Session Equipment Utilized During Treatment: Gait belt;Oxygen(trach collar) Activity Tolerance: Patient tolerated treatment well Patient left: in chair;with call bell/phone within reach;with family/visitor present Nurse Communication: Mobility status;Need for lift equipment PT Visit Diagnosis: Other abnormalities of gait and mobility (R26.89);Muscle weakness (generalized) (M62.81) Pain - Right/Left: Right Pain - part of body: Hip     Time: 1145-1230 PT Time Calculation (min) (ACUTE ONLY): 45 min  Charges:  $Therapeutic Exercise: 8-22 mins $Therapeutic Activity: 23-37 mins                    G Codes:       Luismiguel Lamere,PT Acute Rehabilitation [phone] (pager)    [name] 01/03/2017, 1:01 PM

## 2017-01-03 NOTE — Progress Notes (Signed)
Patient out of bed in chair. 1600 Chest PT held at this time. Family stated they would call when patient is back in bed.

## 2017-01-04 DIAGNOSIS — R0682 Tachypnea, not elsewhere classified: Secondary | ICD-10-CM

## 2017-01-04 DIAGNOSIS — R0902 Hypoxemia: Secondary | ICD-10-CM

## 2017-01-04 DIAGNOSIS — R740 Nonspecific elevation of levels of transaminase and lactic acid dehydrogenase [LDH]: Secondary | ICD-10-CM

## 2017-01-04 LAB — BASIC METABOLIC PANEL
Anion gap: 9 (ref 5–15)
BUN: 21 mg/dL — AB (ref 6–20)
CALCIUM: 9.5 mg/dL (ref 8.9–10.3)
CO2: 28 mmol/L (ref 22–32)
Chloride: 98 mmol/L — ABNORMAL LOW (ref 101–111)
Creatinine, Ser: 0.34 mg/dL — ABNORMAL LOW (ref 0.44–1.00)
GFR calc Af Amer: >60 mL/min (ref >60)
GLUCOSE: 101 mg/dL — AB (ref 65–99)
Potassium: 3.9 mmol/L (ref 3.5–5.1)
Sodium: 135 mmol/L (ref 135–145)

## 2017-01-04 LAB — GLUCOSE, CAPILLARY
GLUCOSE-CAPILLARY: 95 mg/dL (ref 65–99)
Glucose-Capillary: 120 mg/dL — ABNORMAL HIGH (ref 65–99)
Glucose-Capillary: 106 mg/dL — ABNORMAL HIGH (ref 65–99)

## 2017-01-04 MED ORDER — BETHANECHOL CHLORIDE 5 MG PO TABS: 5.0000 mg | ORAL_TABLET | Freq: Every day | ORAL | Status: DC

## 2017-01-04 MED ORDER — FREE WATER: 100.0000 mL | Freq: Three times a day (TID) | Status: DC

## 2017-01-04 NOTE — Progress Notes (Signed)
Patient is stable for discharge to [facility] today. Report called to nurse Will Dorothea Ogle. Family is present at the bedside and aware of discharge plans. VS are currently stable will continue to monitor.

## 2017-01-04 NOTE — Care Management Important Message (Signed)
Important Message  Patient Details  Name: Judy Spears MRN: [id number] Date of Birth: 1950-04-13   Medicare Important Message Given:  Yes    Zenon Mayo, RN 01/04/2017, 10:30 AM

## 2017-01-04 NOTE — Discharge Summary (Addendum)
Physician Discharge Summary  Judy Spears [place] GGY:[id number] DOB: 20-Aug-1950 DOA: 12/05/2016  PCP: Marton Redwood, MD  Admit date: 12/05/2016 Discharge date: 01/04/2017  Time spent: 35 minutes  Recommendations for Outpatient Follow-up:   1. Transferred to [facility] for in hospital rehab--Dr. Terese Door Accepting MD 2. Needs to have mechanical chest percussion at least twice per day (she can use her therapy vest) 3. Needs to have hypertonic saline twice per day 4. Needs to have deep suctioning twice per day 5. Recommend Chem-12 and CBC 1 week 6. Recommend tracheostomy management as per pulmonology and respiratory therapy going forward  7. PEG feeds 90 cc/h with Osmolite continue pro-stat 8. Will need frequent turning and respiratory care as above   Discharge Diagnoses:  Active Problems:   Encephalitis, arthropod-borne viral   Acute on chronic respiratory failure with hypoxia (HCC)   Hyponatremia   Hypokalemia   Pressure injury of skin   Pneumonia   Acute respiratory failure (HCC)   Respiratory distress   Shortness of breath   Chronic mucus hypersecretion, respiratory   Severe protein-calorie malnutrition [name]: less than 60% of standard weight) ([name])   Acute urinary retention   Constipation   Discharge Condition: guarded overall but has made improvements   Diet recommendation: PEG tube  Filed Weights   01/02/17 0504 01/03/17 0557 01/04/17 0329  Weight: 45 kg (99 lb 3.3 oz) 45.7 kg (100 lb 12 oz) 47.5 kg (104 lb 11.5 oz)     Hospital Course:   66 y.o. 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  Note recent hospital stay from 09/24/16 with lethargy-found to have MRSAtracheobornchtiis vs PNA--Rx wih Doxy--course complic by Urinary retenttion     Acute on chronic respiratory failure with hypoxia ([place]): ATC as tolerated vebt off since 12/3 #6 cuffed-downsized to a #4 cuff less 12/11 Saturations above 92% on oxygen we will continue to wean.  Continue suction as above Continue Robitussin 200 iv x/day  New Fever starting on 12/30/2016: blood cultures x2 are neg so far to date CXR shows infiltrate /consoloidation--? endobronchial occlusion 12/6 empirically on IV vancomycin and Zosyn -->levaquin x 1 day by tube and discontinued  Suspect retrospectively did have some mild pneumonitis?--no further abx and improved Prn CXR  probable diarrhea From laxatives and antibiotic use  no suspicion for C. Difficile Colace and MiraLAX held on discharge from this facility, recommend implementation of bowel regimen if constipated  History of encephalitis, arthropod-borne viral Fully dependant for ADL's Discharging to [facility] Dr. Terese Door will be coordinating rehab care Continue baclofen 5 mg 4 times daily for spasm, amantadine 100 bid, primidone 50 qid  Prior history of breast cancer Has not been on anastrozole since about August of this year Outpatient surveillance with Dr. Jana Hakim as a note patient once discharged from [facility]  Hyponatremia: From 137-->133  cut back free water from 200 every 8-100 every 8 depending on labs  Stabilizing   Hypokalemia: Repleted orally now resolved.  Pressure injury of skin stage II on sacrum Continue accommodation and rotation Seemed stable at exam 12/10 Normal Chronic mucus hypersecretion, respiratory  Now resolved  Hypertension Amlodipine 10 mg by tube daily  Severe protein-calorie malnutrition Body mass index is 19.15 kg/m.   Weight is now 100lb, up from 95 on admit Usual weight prior to her Encephalitis was 125  [name]: less than 60% of standard weight) ([name]) Continue PEG feedings with Osmolite 90 cc/h Protostat 30 mils twice daily  Constipation Start miralax, but would hold cutrretnyl as diarrhea  Chronic urinary retention Cut back bethanechol from 5 twice daily to 5 once daily   Procedures:  MICROBIOLOGY: MRSA PCR 11/11 >>Positive  Blood Cultures x2 11/11 >>Negative  Tracheal Aspirate Culture 11/11 >>MRSA Urine Streptococcal Antigen 11/11 >>Negative Urine Legionella Antigen 11/11 >>Negative  Urine Culture 11/18 >>Negative  Blood Cultures x2 11/18 >>neg Tracheal Aspirate Culture 11/19 >>MRSA   ANTIBIOTICS: Merrem 11/11 - 11/13 Diflucan 11/18- 11/20 Nystatin Powder 11/20 >> Vancomycin 11/11 - 11/17; 11/18 >> 11/24 Fortaz 11/18 >> 11/24  SIGNIFICANT EVENTS: 11/11 - Admit 11/12 - Worsening work of breathing & desat >> trach changed to cuffed #4 & placed on full vent support 11/16 - Transfer to vent SDU bed 11/17 - Transferred back to ICU w/ fever 11/21 - Trach up-sized by ENT to #6 cuffed  11/22 - Tolerated limited PS weaning 11/24 - To SDU  11/25 - ATC x 8-9 hours  11/26 - ATC 40% 11/30 - currently on pressure control ventilation with pressure control of 12. 11/29 - episode of mucus plugging, back on vent 12/02 - weaned 7 hours on ATC, rested on vent overnight 12/03 - ATC  12/05 - Remains on 21% ATC 12/11-downsized to #4 cuff less Passy-Muir valve in place    Discharge Exam: Vitals:   01/04/17 0740 01/04/17 0802  BP: (!) 146/88 (!) 137/101  Pulse: 88 87  Resp: (!) 23 (!) 34  Temp:  98.7 F (37.1 C)  SpO2: 98% 96%   Awake alert no distress other than pain in right hip from fracture and movement Able to phonate to some extent-saying good morning was somewhat unintelligible but it was clear that she had said a multisyllabic word Seems to understand my  discussion with her Pupils equally reactive tracks well (dentures clear dentures) in place Tracheostomy in place does not appear to have secretions or dried blood and looks clean to exam Passy-Muir valve in place Chest is clear Abdomen is soft with PEG tube in place left lower quadrant and clean appearing stoma Flexion contractures upper arms with contracture at the wrist and elbow however able to raise hands above head reflexes equivocal given patient effort with movement and not resting in place Lower extremities strong in the left lower extremity grade 5/5 power brisk reflex and knee jerk cannot assess for sensory right leg power undefined given fracture and hip on that side Sacral decubitus not examined today  Discharge Instructions    Allergies as of 01/04/2017      Reactions   Cefepime Other (See Comments)   Status epilepticus- this reaction is questionable per husband.States that it was determined that pt did not have seizure activity. Status epilepticus   Tetracyclines & Related Photosensitivity, Other (See Comments)   Blisters (childhood allergy)   Doxycycline Monohydrate Other (See Comments)   Blisters, photosensitivity- family state that the reaction is actually to tetracycline.   Pollen Extract Other (See Comments)   Running nose (seasonal allergies)      Medication List    STOP taking these medications   saccharomyces boulardii 250 MG capsule Commonly known as:  FLORASTOR  TAKE these medications   albuterol (2.5 MG/3ML) 0.083% nebulizer solution Commonly known as:  PROVENTIL Take 3 mLs (2.5 mg total) by nebulization every 6 (six) hours as needed for wheezing or shortness of breath. What changed:  reasons to take this   amantadine 50 MG/5ML solution Commonly known as:  SYMMETREL PLACE 56mL INTO FEEDING TUBE TWICE DAILY What changed:  See the new instructions.   amLODipine 10 MG tablet Commonly known as:  NORVASC Place 1 tablet (10 mg total) into feeding  tube daily.   atorvastatin 20 MG tablet Commonly known as:  LIPITOR Place 20 mg into feeding tube at bedtime.   baclofen 10 MG tablet Commonly known as:  LIORESAL PLACE 1 TABLET INTO FEEDING TUBE EVERY 6HOURS What changed:  See the new instructions.   bethanechol 5 MG tablet Commonly known as:  URECHOLINE 5 mg BID x 1 week, then 5mg  QD x 1 week and then stop What changed:    medication strength  how much to take  how to take this  when to take this  additional instructions   bethanechol 5 MG tablet Commonly known as:  URECHOLINE Take 1 tablet (5 mg total) by mouth daily. Start taking on:  01/05/2017 What changed:  You were already taking a medication with the same name, and this prescription was added. Make sure you understand how and when to take each.   chlorhexidine 0.12 % solution Commonly known as:  PERIDEX 63ml IN THE MOUTH OR THROAT TWICE DAILY What changed:  See the new instructions.   clotrimazole 1 % cream Commonly known as:  LOTRIMIN Apply topically 2 (two) times daily. What changed:    how much to take  when to take this  reasons to take this   COQ10 PO Place 10 mLs at bedtime into feeding tube.   CRANBERRY JUICE EXTRACT PO Place 60 mLs daily into feeding tube.   docusate sodium 100 MG capsule Commonly known as:  COLACE 100 mg See admin instructions. Give 1 capsule (100 mg) per tube twice daily   feeding supplement (OSMOLITE 1.5 CAL) Liqd Place 1,000 mLs into feeding tube daily. What changed:    how much to take  when to take this  additional instructions   feeding supplement (PRO-STAT SUGAR FREE 64) Liqd Place 30 mLs into feeding tube 2 (two) times daily.   fluticasone 50 MCG/ACT nasal spray Commonly known as:  FLONASE Place 2 sprays 2 (two) times daily into both nostrils.   free water Soln Place 200 mLs into feeding tube every 8 (eight) hours. What changed:  when to take this   free water Soln Place 100 mLs into feeding  tube every 8 (eight) hours. What changed:  You were already taking a medication with the same name, and this prescription was added. Make sure you understand how and when to take each.   gabapentin 100 MG capsule Commonly known as:  NEURONTIN One capsule--open and administer via PEG twice a day. What changed:    how much to take  when to take this  additional instructions   guaiFENesin 100 MG/5ML Soln Commonly known as:  ROBITUSSIN Place 10 mLs (200 mg total) into feeding tube 4 (four) times daily. What changed:    when to take this  additional instructions   loratadine 10 MG tablet Commonly known as:  CLARITIN Place 1 tablet (10 mg total) into feeding tube daily as needed for allergies. What changed:    when to take this  additional  instructions   Melatonin 5 MG Caps Place 5 mg at bedtime into feeding tube.   multivitamin Liqd Place 15 mLs into feeding tube daily.   potassium chloride 20 MEQ/15ML (10%) Soln Place 15 mLs (20 mEq total) into feeding tube daily.   primidone 50 MG tablet Commonly known as:  MYSOLINE Place 1 tablet (50 mg total) into feeding tube 4 (four) times daily.   PROBIOTIC PO Place 1 capsule 2 (two) times daily into feeding tube.   sodium chloride HYPERTONIC 3 % nebulizer solution Take by nebulization 2 (two) times daily as needed for other. What changed:    how much to take  reasons to take this   valACYclovir 500 MG tablet Commonly known as:  VALTREX Place 500 mg daily into feeding tube.   vitamin C 500 MG tablet Commonly known as:  ASCORBIC ACID Place 500 mg daily into feeding tube.   Vitamin D 2000 units Caps Place 2,000 Units daily into feeding tube.      Allergies  Allergen Reactions  . Cefepime Other (See Comments)    Status epilepticus- this reaction is questionable per husband.States that it was determined that pt did not have seizure activity. Status epilepticus  . Tetracyclines & Related Photosensitivity and  Other (See Comments)    Blisters (childhood allergy)  . Doxycycline Monohydrate Other (See Comments)    Blisters, photosensitivity- family state that the reaction is actually to tetracycline.  . Pollen Extract Other (See Comments)    Running nose (seasonal allergies)      The results of significant diagnostics from this hospitalization (including imaging, microbiology, ancillary and laboratory) are listed below for reference.    Significant Diagnostic Studies: Ct Chest Wo Contrast  Result Date: 12/15/2016 CLINICAL DATA:  Tracheostomy replacement. Potential pneumothorax on chest x-ray EXAM: CT CHEST WITHOUT CONTRAST TECHNIQUE: Multidetector CT imaging of the chest was performed following the standard protocol without IV contrast. COMPARISON:  Radiograph [id number] FINDINGS: Cardiovascular: No significant vascular findings. Normal heart size. No pericardial effusion. Mediastinum/Nodes: No axillary or supraclavicular adenopathy. No mediastinal adenopathy. Lungs/Pleura: Tracheostomy tube in proximal trachea. Central airways normal No pneumothorax present in the LEFT or RIGHT hemithorax. Band of atelectasis in the RIGHT lower lobe. Mucous plugging in the LEFT lower lobe with postobstructive collapse of the medial LEFT lower lobe (image 77 through 88 of series 4). Upper Abdomen: Limited view of the liver, kidneys, pancreas are unremarkable. Normal adrenal glands. Gastrostomy tube partially imaged. Musculoskeletal: No aggressive osseous lesion. IMPRESSION: 1. No pneumothorax present. 2. Mucous plugging in the medial LEFT lower lobe with postobstructive atelectasis. 3. Band of atelectasis in the RIGHT lower lobe. Electronically Signed   By: Suzy Bouchard M.D.   On: 12/15/2016 09:16   Dg Chest Port 1 View  Result Date: 01/02/2017 CLINICAL DATA:  66 year old female with pneumonia. EXAM: PORTABLE CHEST 1 VIEW COMPARISON:  01/01/2017 FINDINGS: Tracheostomy tube in place. Postsurgical changes are noted at the  left mid lung. Cardiomediastinal silhouette unchanged in size and morphology. Right basilar airspace opacities stable to slightly decreased from prior examination. Stable appearance of the left lung base with atelectasis and possible small effusion. IMPRESSION: 1. Stable to slightly improved right lower lobe pneumonia. 2. Stable left basilar atelectasis. Question small associated effusion. Superimposed consolidation difficult to exclude in this setting. Electronically Signed   By: Kristopher Oppenheim M.D.   On: 01/02/2017 07:53   Dg Chest Port 1 View  Result Date: 01/01/2017 CLINICAL DATA:  Pneumonia EXAM: PORTABLE CHEST 1 VIEW COMPARISON:  12/31/2016 FINDINGS: Tracheostomy tube in good position. Normal cardiac silhouette. Faint airspace disease in the RIGHT lower lobe is slightly increased in prominence compared to prior. Mild LEFT basilar atelectasis with tenting of the diaphragms unchanged. IMPRESSION: Subtle RIGHT lower lobe pneumonia. Stable LEFT basilar atelectasis. Electronically Signed   By: Suzy Bouchard M.D.   On: 01/01/2017 10:04   Dg Chest Port 1 View  Result Date: 12/31/2016 CLINICAL DATA:  Tracheostomy. EXAM: PORTABLE CHEST 1 VIEW COMPARISON:  Radiograph December 25, 2016. FINDINGS: Stable cardiomediastinal silhouette. Tracheostomy tube is in grossly good position. No pneumothorax is noted. Mild right basilar subsegmental atelectasis is noted. There appears to be increased atelectasis or infiltrate of left lower lobe with possible mild left pleural effusion. Bony thorax is unremarkable. IMPRESSION: Stable tracheostomy tube. Mild right basilar subsegmental atelectasis. Increased left lower lobe atelectasis or infiltrate is noted with possible mild left pleural effusion. Endobronchial occlusion cannot be excluded. CT or bronchoscopy may be performed for further evaluation. Electronically Signed   By: Marijo Conception, M.D.   On: 12/31/2016 08:38   Dg Chest Port 1 View  Result Date:  12/25/2016 CLINICAL DATA:  Respiratory failure EXAM: PORTABLE CHEST 1 VIEW COMPARISON:  12/24/2016 FINDINGS: Tracheostomy is unchanged. Heart is normal size. No confluent airspace opacities or effusions. No acute bony abnormality. IMPRESSION: No active disease. Electronically Signed   By: Rolm Baptise M.D.   On: 12/25/2016 07:50   Portable Chest Xray  Result Date: 12/24/2016 CLINICAL DATA:  Respiratory distress EXAM: PORTABLE CHEST 1 VIEW COMPARISON:  12/20/2016, 12/16/2016 FINDINGS: Tracheostomy tube with tip about 2.6 cm superior to carina. Improved aeration of the left lung base with minimal atelectasis noted. Stable cardiomediastinal silhouette. No pneumothorax. IMPRESSION: Improved aeration of left base with better visualized diaphragm. Minimal residual atelectasis at the left base. Electronically Signed   By: Donavan Foil M.D.   On: 12/24/2016 03:07   Dg Chest Port 1 View  Result Date: 12/20/2016 CLINICAL DATA:  66 year old female with mucous plugging. Subsequent encounter. EXAM: PORTABLE CHEST 1 VIEW COMPARISON:  12/19/2016 chest x-ray.  12/15/2016 chest CT. FINDINGS: Tracheostomy tube tip midline. Persistent bibasilar consolidation greater on left may represent atelectasis or infiltrate. Central pulmonary vascular prominence without pulmonary edema. No pneumothorax. Heart size within normal limits. IMPRESSION: Persistent bibasilar consolidation greater on left may represent atelectasis or infiltrate. Electronically Signed   By: Genia Del M.D.   On: 12/20/2016 07:59   Dg Chest Port 1 View  Result Date: 12/19/2016 CLINICAL DATA:  Respiratory failure. EXAM: PORTABLE CHEST 1 VIEW COMPARISON:  12/16/2016 FINDINGS: Tracheostomy tube overlies the airway. The cardiac silhouette is normal in size. Density in the retrocardiac left lower lobe with associated volume loss is unchanged. Mild patchy right basilar opacity is unchanged. No sizable pleural effusion or pneumothorax is identified.  IMPRESSION: Unchanged left lower lobe collapse and patchy right basilar atelectasis or infiltrate. Electronically Signed   By: Logan Bores M.D.   On: 12/19/2016 06:41   Dg Chest Port 1 View  Result Date: 12/16/2016 CLINICAL DATA:  Acute respiratory failure with hypoxia EXAM: PORTABLE CHEST 1 VIEW COMPARISON:  12/15/2016 FINDINGS: Tracheostomy remains in good position. Progression of left lower lobe atelectasis/ infiltrate. No change in mild patchy right lower lobe airspace disease. Negative for heart failure or effusion. Skin fold on the right.  No pneumothorax. IMPRESSION: Progression of left lower lobe atelectasis/ infiltrate. No change in mild airspace disease in the right lung base. No pneumothorax. Electronically Signed   By: Juanda Crumble  Carlis Abbott M.D.   On: 12/16/2016 06:31   Dg Chest Port 1 View  Result Date: 12/15/2016 CLINICAL DATA:  Hypoxia EXAM: PORTABLE CHEST 1 VIEW COMPARISON:  December 13, 2016 FINDINGS: Tracheostomy catheter tip is 7.0 cm above the carina. There is a small pneumothorax on the right laterally without tension component. There is focal airspace consolidation in the right base region, increased from 2 days prior. Left lung remains clear. Heart size and pulmonary vascularity are normal. No adenopathy. Postoperative change on the left noted. Bones osteoporotic. IMPRESSION: Small pneumothorax in right laterally without tension component. Increase in opacity right base, likely pneumonia. Lungs elsewhere clear. Stable cardiac silhouette. Critical Value/emergent results were called by telephone at the time of interpretation on 12/15/2016 at 7:13 am to Janann Colonel, RN , who verbally acknowledged these results. Electronically Signed   By: Lowella Grip III M.D.   On: 12/15/2016 07:15   Dg Chest Port 1 View  Result Date: 12/13/2016 CLINICAL DATA:  Respiratory failure EXAM: PORTABLE CHEST 1 VIEW COMPARISON:  12/12/2016 FINDINGS: Tracheostomy remains in good position. Decreased  lung volume since the prior study with increase in bibasilar atelectasis. Negative for heart failure or effusion IMPRESSION: Hypoventilation with interval increase in bibasilar atelectasis. Tracheostomy remains in good position. Electronically Signed   By: Franchot Gallo M.D.   On: 12/13/2016 07:13   Dg Chest Port 1 View  Result Date: 12/12/2016 CLINICAL DATA:  Tachypnea EXAM: PORTABLE CHEST 1 VIEW COMPARISON:  Yesterday FINDINGS: Tracheostomy in place. Normal heart size and mediastinal contours. Percutaneous gastrostomy tube is present. Possible emphysema. There is no edema, consolidation, effusion, or pneumothorax. Nipple shadow on the left. IMPRESSION: No evidence of acute disease.  Stable from yesterday. Electronically Signed   By: Monte Fantasia M.D.   On: 12/12/2016 15:12   Dg Chest Port 1 View  Result Date: 12/11/2016 CLINICAL DATA:  Respiratory failure EXAM: PORTABLE CHEST 1 VIEW COMPARISON:  12/09/2016 FINDINGS: Tracheostomy tube remains in place, unchanged. Heart is normal size. No confluent airspace opacity. Improved aeration in the bases since prior study. No effusions or acute bony abnormality. IMPRESSION: Improving aeration.  No active disease. Electronically Signed   By: Rolm Baptise M.D.   On: 12/11/2016 08:05   Dg Chest Port 1 View  Result Date: 12/09/2016 CLINICAL DATA:  Respiratory failure with shortness of breath EXAM: PORTABLE CHEST 1 VIEW COMPARISON:  Yesterday FINDINGS: Indistinct opacities at the bases with streaky pattern. There is artifact from EKG leads and tracheostomy tube Ing. No edema, effusion, or pneumothorax. Normal heart size and mediastinal contours. Tracheostomy tube in good position. IMPRESSION: Stable mild lung opacification at the bases. Electronically Signed   By: Monte Fantasia M.D.   On: 12/09/2016 07:13   Dg Chest Port 1 View  Result Date: 12/08/2016 CLINICAL DATA:  Healthcare associated pneumonia. EXAM: PORTABLE CHEST 1 VIEW COMPARISON:  One-view  chest x-ray 12/07/2016 FINDINGS: Tracheostomy tube is stable in position. Right middle lobe airspace disease is unchanged. Left lower lobe airspace disease is improved. The left hemidiaphragm is better visualized. The upper lung fields are clear bilaterally. Degenerative changes are noted at the shoulders. IMPRESSION: 1. Improving left lower lobe airspace disease compatible with resolving pneumonia. 2. Persistent right middle lobe airspace disease, infection or atelectasis. 3. Tracheostomy tube is in satisfactory position. Electronically Signed   By: San Morelle M.D.   On: 12/08/2016 06:44   Dg Chest Port 1 View  Result Date: 12/07/2016 CLINICAL DATA:  Healthcare associated pneumonia. EXAM: PORTABLE CHEST 1  VIEW COMPARISON:  12/06/2016. FINDINGS: Tracheostomy tube in stable position. Surgical clips noted over the left chest. Heart size stable. Persistent right mid lung field and left base atelectasis/infiltrates. No interim change. Small left pleural effusion cannot be excluded. No pneumothorax. IMPRESSION: 1. Tracheostomy tube stable position. 2. Persistent right mid lung field and left base atelectasis and infiltrates and small left pleural effusion. No change. Electronically Signed   By: Marcello Moores  Register   On: 12/07/2016 06:54   Dg Chest Port 1 View  Result Date: 12/06/2016 CLINICAL DATA:  Tracheostomy exchange.  Acute respiratory failure. EXAM: PORTABLE CHEST 1 VIEW COMPARISON:  12/06/2016, 12/05/2016 and 11/11/2016 FINDINGS: Tracheostomy tube in place. Persistent consolidation at the left lung base with a probable left effusion. The hazy infiltrate in the right midzone has increased slightly. Atelectasis at the right base is unchanged. Heart size and vascularity are normal. Diffuse osteopenia. IMPRESSION: 1. Tracheostomy tube appears in good position. 2. Slight increased hazy infiltrate in the right midzone. 3. Persistent consolidation and effusion at the left lung base. Electronically Signed    By: Lorriane Shire M.D.   On: 12/06/2016 09:21   Dg Chest Port 1 View  Result Date: 12/06/2016 CLINICAL DATA:  Pneumonia EXAM: PORTABLE CHEST 1 VIEW COMPARISON:  December 05, 2016 FINDINGS: Tracheostomy catheter tip is 4.7 cm above the carina. No pneumothorax. There is airspace consolidation throughout much of the left lower lobe, increased from 1 day prior. There is a small left pleural effusion. There is patchy atelectasis in the right mid and lower lung zones, similar 1 day prior. Heart remains prominent with pulmonary vascularity within normal limits. No adenopathy. No focal bone lesions. IMPRESSION: Tracheostomy as described without pneumothorax. Increase in left lower lobe airspace consolidation, felt to represent progression of pneumonia. Small left pleural effusion. Patchy atelectasis in the right persist. Cardiac silhouette is stable. Electronically Signed   By: Lowella Grip III M.D.   On: 12/06/2016 07:05   Dg Chest Port 1 View  Result Date: 12/05/2016 CLINICAL DATA:  66 y/o  F; shortness of breath. EXAM: PORTABLE CHEST 1 VIEW COMPARISON:  11/11/2016 chest radiograph FINDINGS: Stable cardiac silhouette. Tracheostomy tube in situ. Interim development of patchy opacities within the lung bases bilaterally and diffuse pulmonary reticular opacities. No pleural effusion or pneumothorax. Bones are unremarkable. IMPRESSION: Reticular opacities, probably mild interstitial edema. Patchy opacities in lung bases may represent alveolar edema or pneumonia. Electronically Signed   By: Kristine Garbe M.D.   On: 12/05/2016 16:40   US Abdomen Limited Ruq  Result Date: 12/13/2016 CLINICAL DATA:  66 year old female with elevated liver function test. EXAM: ULTRASOUND ABDOMEN LIMITED RIGHT UPPER QUADRANT COMPARISON:  Abdominal ultrasound dated 11/15/2016 FINDINGS: Evaluation is somewhat limited as the patient was ventilated and could not hold her breath. Gallbladder: No gallstones or wall  thickening visualized. No sonographic Murphy sign noted by sonographer. Common bile duct: Diameter: 3 mm Liver: No focal lesion identified. Within normal limits in parenchymal echogenicity. Portal vein is patent on color Doppler imaging with normal direction of blood flow towards the liver. IMPRESSION: Unremarkable right upper quadrant ultrasound. Electronically Signed   By: Anner Crete M.D.   On: 12/13/2016 21:46    Microbiology: Recent Results (from the past 240 hour(s))  Culture, blood (Routine X 2) w Reflex to ID Panel     Status: None (Preliminary result)   Collection Time: 12/31/16  9:22 AM  Result Value Ref Range Status   Specimen Description BLOOD LEFT HAND  Final  Special Requests IN PEDIATRIC BOTTLE Blood Culture adequate volume  Final   Culture NO GROWTH 3 DAYS  Final   Report Status PENDING  Incomplete  Culture, blood (Routine X 2) w Reflex to ID Panel     Status: None (Preliminary result)   Collection Time: 12/31/16  9:25 AM  Result Value Ref Range Status   Specimen Description BLOOD RIGHT HAND  Final   Special Requests   Final    IN PEDIATRIC BOTTLE Blood Culture results may not be optimal due to an excessive volume of blood received in culture bottles   Culture NO GROWTH 3 DAYS  Final   Report Status PENDING  Incomplete     Labs: Basic Metabolic Panel: Recent Labs  Lab 12/31/16 0455 01/01/17 0252 01/02/17 0430 01/03/17 0351 01/04/17 0337  NA 141 137 133* 133* 135  K 3.5 3.2* 3.8 3.9 3.9  CL 101 99* 97* 96* 98*  CO2 30 28 28 27 28   GLUCOSE 85 87 132* 100* 101*  BUN 24* 23* 14 18 21*  CREATININE 0.40* 0.33* 0.35* 0.34* 0.34*  CALCIUM 9.0 8.9 8.8* 9.2 9.5   Liver Function Tests: Recent Labs  Lab 01/02/17 0430 01/03/17 0351  AST 51* 36  ALT 141* 114*  ALKPHOS 105 104  BILITOT 0.9 0.7  PROT 6.0* 6.4*  ALBUMIN 3.1* 3.3*   No results for input(s): LIPASE, AMYLASE in the last 168 hours. No results for input(s): AMMONIA in the last 168  hours. CBC: Recent Labs  Lab 12/29/16 0604 12/31/16 0455 01/02/17 0430 01/03/17 0351  WBC 8.2 8.3 8.0 7.7  NEUTROABS  --   --  4.7 4.6  HGB 13.3 13.0 12.5 13.4  HCT 41.4 41.3 39.3 41.1  MCV 90.6 92.4 91.2 90.3  PLT 285 287 250 255   Cardiac Enzymes: No results for input(s): CKTOTAL, CKMB, CKMBINDEX, TROPONINI in the last 168 hours. BNP: BNP (last 3 results) No results for input(s): BNP in the last 8760 hours.  ProBNP (last 3 results) No results for input(s): PROBNP in the last 8760 hours.  CBG: Recent Labs  Lab 01/03/17 1616 01/03/17 2006 01/03/17 2351 01/04/17 0327 01/04/17 0800  GLUCAP 116* 102* 104* 120* 95       Signed:  Nita Sells MD   [facility] 01/04/2017, 10:15 AM

## 2017-01-04 NOTE — Care Management Note (Signed)
Case Management Note  Patient Details  Name: Judy Spears MRN: [id number] Date of Birth: January 20, 1951  Subjective/Objective:  NCM spoke with Judy Spears at the [facility], updated her on vitals , labs etc.  She state they can take patient today, they will expect her by 3 pm.  This information was given to MD , RN, and patient's spouse.  NCM contacted [facility] for transport they will be here at 1 pm to transport her to the [facility] at [place] form is on the chart.                   Action/Plan: DC to [place], accepting MD is Terese Door.   Expected Discharge Date:                  Expected Discharge Plan:  [place]  In-House Referral:     Discharge planning Services  CM Consult  Post Acute Care Choice:    Choice offered to:     DME Arranged:    DME Agency:     HH Arranged:    [place] Agency:     Status of Service:  Completed, signed off  If discussed at [employer] of Stay Meetings, dates discussed:    Additional Comments:  Zenon Mayo, RN 01/04/2017, 10:31 AM

## 2017-01-05 LAB — CULTURE, BLOOD (ROUTINE X 2)
CULTURE: NO GROWTH
Culture: NO GROWTH
Special Requests: ADEQUATE

## 2017-01-11 ENCOUNTER — Ambulatory Visit: Payer: Medicare Other | Admitting: Pulmonary Disease

## 2017-01-11 MED ORDER — POTASSIUM CHLORIDE 20 MEQ PO PACK
PACK | ORAL | Status: DC
Start: 2017-01-11 — End: 2017-01-11

## 2017-01-11 MED ORDER — AMANTADINE HCL 50 MG/5ML PO SYRP
100.00 mg | ORAL_SOLUTION | ORAL | Status: DC
Start: 2017-01-11 — End: 2017-01-11

## 2017-01-11 MED ORDER — VALACYCLOVIR HCL PO
500.00 mg | ORAL | Status: DC
Start: 2017-01-12 — End: 2017-01-11

## 2017-01-11 MED ORDER — ALBUTEROL SULFATE HFA 108 (90 BASE) MCG/ACT IN AERS
2.00 | INHALATION_SPRAY | RESPIRATORY_TRACT | Status: DC
Start: 2017-01-11 — End: 2017-01-11

## 2017-01-11 MED ORDER — CHLORHEXIDINE GLUCONATE 0.12 % MT SOLN
15.00 mL | OROMUCOSAL | Status: DC
Start: 2017-01-11 — End: 2017-01-11

## 2017-01-11 MED ORDER — POLYETHYLENE GLYCOL 3350 17 G PO PACK
17.00 g | PACK | ORAL | Status: DC
Start: 2017-01-12 — End: 2017-01-11

## 2017-01-11 MED ORDER — BACLOFEN 10 MG PO TABS
10.00 mg | ORAL_TABLET | ORAL | Status: DC
Start: 2017-01-11 — End: 2017-01-11

## 2017-01-11 MED ORDER — HEPARIN SODIUM (PORCINE) 5000 UNIT/ML IJ SOLN
5000.00 | INTRAMUSCULAR | Status: DC
Start: 2017-01-11 — End: 2017-01-11

## 2017-01-11 MED ORDER — GENERIC EXTERNAL MEDICATION
40.00 mg | Status: DC
Start: 2017-01-12 — End: 2017-01-11

## 2017-01-11 MED ORDER — CHOLECALCIFEROL 25 MCG (1000 UT) PO TABS
2000.00 | ORAL_TABLET | ORAL | Status: DC
Start: 2017-01-12 — End: 2017-01-11

## 2017-01-11 MED ORDER — GUAIFENESIN 100 MG/5ML PO SYRP
200.00 mg | ORAL_SOLUTION | ORAL | Status: DC
Start: ? — End: 2017-01-11

## 2017-01-11 MED ORDER — CARBIDOPA-LEVODOPA 25-100 MG PO TABS
2.00 | ORAL_TABLET | ORAL | Status: DC
Start: 2017-01-12 — End: 2017-01-11

## 2017-01-11 MED ORDER — MELATONIN 3 MG PO TABS
6.00 mg | ORAL_TABLET | ORAL | Status: DC
Start: 2017-01-11 — End: 2017-01-11

## 2017-01-11 MED ORDER — PRIMIDONE 50 MG PO TABS
50.00 mg | ORAL_TABLET | ORAL | Status: DC
Start: 2017-01-11 — End: 2017-01-11

## 2017-01-11 MED ORDER — DOCUSATE SODIUM 100 MG PO CAPS
100.00 mg | ORAL_CAPSULE | ORAL | Status: DC
Start: ? — End: 2017-01-11

## 2017-01-11 MED ORDER — GABAPENTIN 100 MG PO CAPS
100.00 mg | ORAL_CAPSULE | ORAL | Status: DC
Start: 2017-01-11 — End: 2017-01-11

## 2017-01-11 MED ORDER — BETHANECHOL CHLORIDE PO
5.00 mg | ORAL | Status: DC
Start: 2017-01-12 — End: 2017-01-11

## 2017-01-11 MED ORDER — NYSTATIN 100000 UNIT/GM EX POWD
CUTANEOUS | Status: DC
Start: 2017-01-11 — End: 2017-01-11

## 2017-01-11 MED ORDER — GENERIC EXTERNAL MEDICATION
Status: DC
Start: 2017-01-11 — End: 2017-01-11

## 2017-01-11 MED ORDER — CLOTRIMAZOLE 1 % EX CREA
TOPICAL_CREAM | CUTANEOUS | Status: DC
Start: 2017-01-11 — End: 2017-01-11

## 2017-01-11 MED ORDER — AMLODIPINE BESYLATE 5 MG PO TABS
10.00 mg | ORAL_TABLET | ORAL | Status: DC
Start: 2017-01-12 — End: 2017-01-11

## 2017-01-11 MED ORDER — ROSUVASTATIN CALCIUM 5 MG PO TABS
10.00 mg | ORAL_TABLET | ORAL | Status: DC
Start: 2017-01-11 — End: 2017-01-11

## 2017-01-11 MED ORDER — ACETAMINOPHEN 500 MG PO TABS
500.00 mg | ORAL_TABLET | ORAL | Status: DC
Start: 2017-01-11 — End: 2017-01-11

## 2017-01-11 MED ORDER — CALCIUM-VITAMIN D3 250-125 MG-UNIT PO TABS
2.00 | ORAL_TABLET | ORAL | Status: DC
Start: 2017-01-12 — End: 2017-01-11

## 2017-01-11 MED ORDER — VITAMIN C 500 MG PO TABS
500.00 mg | ORAL_TABLET | ORAL | Status: DC
Start: 2017-01-12 — End: 2017-01-11

## 2017-01-11 MED ORDER — GENERIC EXTERNAL MEDICATION
4.00 | Status: DC
Start: 2017-01-11 — End: 2017-01-11

## 2017-01-24 ENCOUNTER — Other Ambulatory Visit: Payer: Self-pay

## 2017-01-24 ENCOUNTER — Emergency Department (HOSPITAL_COMMUNITY): Payer: Medicare Other

## 2017-01-24 ENCOUNTER — Encounter (HOSPITAL_COMMUNITY): Payer: Self-pay

## 2017-01-24 ENCOUNTER — Ambulatory Visit: Payer: Medicare Other | Admitting: Physical Therapy

## 2017-01-24 ENCOUNTER — Inpatient Hospital Stay (HOSPITAL_COMMUNITY)
Admission: EM | Admit: 2017-01-24 | Discharge: 2017-02-02 | DRG: 981 | Disposition: A | Discharge status: Home Health | Payer: Medicare Other | Attending: Internal Medicine | Admitting: Internal Medicine

## 2017-01-24 DIAGNOSIS — Z91048 Other nonmedicinal substance allergy status: Secondary | ICD-10-CM

## 2017-01-24 DIAGNOSIS — J189 Pneumonia, unspecified organism: Secondary | ICD-10-CM | POA: Diagnosis present

## 2017-01-24 DIAGNOSIS — B948 Sequelae of other specified infectious and parasitic diseases: Secondary | ICD-10-CM | POA: Diagnosis not present

## 2017-01-24 DIAGNOSIS — A852 Arthropod-borne viral encephalitis, unspecified: Secondary | ICD-10-CM | POA: Diagnosis not present

## 2017-01-24 DIAGNOSIS — Z8701 Personal history of pneumonia (recurrent): Secondary | ICD-10-CM

## 2017-01-24 DIAGNOSIS — J9621 Acute and chronic respiratory failure with hypoxia: Secondary | ICD-10-CM | POA: Diagnosis present

## 2017-01-24 DIAGNOSIS — R1310 Dysphagia, unspecified: Secondary | ICD-10-CM | POA: Diagnosis present

## 2017-01-24 DIAGNOSIS — Z931 Gastrostomy status: Secondary | ICD-10-CM | POA: Diagnosis not present

## 2017-01-24 DIAGNOSIS — Z01811 Encounter for preprocedural respiratory examination: Secondary | ICD-10-CM | POA: Diagnosis not present

## 2017-01-24 DIAGNOSIS — Z930 Tracheostomy status: Secondary | ICD-10-CM

## 2017-01-24 DIAGNOSIS — J9610 Chronic respiratory failure, unspecified whether with hypoxia or hypercapnia: Secondary | ICD-10-CM | POA: Diagnosis present

## 2017-01-24 DIAGNOSIS — Y95 Nosocomial condition: Secondary | ICD-10-CM | POA: Diagnosis present

## 2017-01-24 DIAGNOSIS — S72009A Fracture of unspecified part of neck of unspecified femur, initial encounter for closed fracture: Secondary | ICD-10-CM

## 2017-01-24 DIAGNOSIS — J988 Other specified respiratory disorders: Secondary | ICD-10-CM

## 2017-01-24 DIAGNOSIS — Z79899 Other long term (current) drug therapy: Secondary | ICD-10-CM

## 2017-01-24 DIAGNOSIS — M80852A Other osteoporosis with current pathological fracture, left femur, initial encounter for fracture: Secondary | ICD-10-CM | POA: Diagnosis present

## 2017-01-24 DIAGNOSIS — J209 Acute bronchitis, unspecified: Secondary | ICD-10-CM | POA: Diagnosis present

## 2017-01-24 DIAGNOSIS — G259 Extrapyramidal and movement disorder, unspecified: Secondary | ICD-10-CM | POA: Diagnosis present

## 2017-01-24 DIAGNOSIS — E871 Hypo-osmolality and hyponatremia: Secondary | ICD-10-CM | POA: Diagnosis present

## 2017-01-24 DIAGNOSIS — D638 Anemia in other chronic diseases classified elsewhere: Secondary | ICD-10-CM | POA: Diagnosis present

## 2017-01-24 DIAGNOSIS — M80851G Other osteoporosis with current pathological fracture, right femur, subsequent encounter for fracture with delayed healing: Secondary | ICD-10-CM | POA: Diagnosis present

## 2017-01-24 DIAGNOSIS — R0603 Acute respiratory distress: Secondary | ICD-10-CM | POA: Diagnosis present

## 2017-01-24 DIAGNOSIS — Z7951 Long term (current) use of inhaled steroids: Secondary | ICD-10-CM

## 2017-01-24 DIAGNOSIS — G9349 Other encephalopathy: Secondary | ICD-10-CM | POA: Diagnosis present

## 2017-01-24 DIAGNOSIS — S72002D Fracture of unspecified part of neck of left femur, subsequent encounter for closed fracture with routine healing: Secondary | ICD-10-CM | POA: Diagnosis not present

## 2017-01-24 DIAGNOSIS — J9611 Chronic respiratory failure with hypoxia: Secondary | ICD-10-CM | POA: Diagnosis not present

## 2017-01-24 DIAGNOSIS — Z681 Body mass index (BMI) 19 or less, adult: Secondary | ICD-10-CM | POA: Diagnosis not present

## 2017-01-24 DIAGNOSIS — Z8614 Personal history of Methicillin resistant Staphylococcus aureus infection: Secondary | ICD-10-CM

## 2017-01-24 DIAGNOSIS — E785 Hyperlipidemia, unspecified: Secondary | ICD-10-CM | POA: Diagnosis present

## 2017-01-24 DIAGNOSIS — M2450 Contracture, unspecified joint: Secondary | ICD-10-CM | POA: Diagnosis present

## 2017-01-24 DIAGNOSIS — R64 Cachexia: Secondary | ICD-10-CM | POA: Diagnosis present

## 2017-01-24 DIAGNOSIS — R609 Edema, unspecified: Secondary | ICD-10-CM | POA: Diagnosis not present

## 2017-01-24 DIAGNOSIS — T17990A Other foreign object in respiratory tract, part unspecified in causing asphyxiation, initial encounter: Secondary | ICD-10-CM | POA: Diagnosis present

## 2017-01-24 DIAGNOSIS — M7989 Other specified soft tissue disorders: Secondary | ICD-10-CM | POA: Diagnosis not present

## 2017-01-24 DIAGNOSIS — I10 Essential (primary) hypertension: Secondary | ICD-10-CM | POA: Diagnosis present

## 2017-01-24 DIAGNOSIS — E876 Hypokalemia: Secondary | ICD-10-CM | POA: Diagnosis present

## 2017-01-24 DIAGNOSIS — J9601 Acute respiratory failure with hypoxia: Secondary | ICD-10-CM | POA: Diagnosis not present

## 2017-01-24 DIAGNOSIS — G8250 Quadriplegia, unspecified: Secondary | ICD-10-CM | POA: Diagnosis present

## 2017-01-24 DIAGNOSIS — J9690 Respiratory failure, unspecified, unspecified whether with hypoxia or hypercapnia: Secondary | ICD-10-CM

## 2017-01-24 DIAGNOSIS — R140 Abdominal distension (gaseous): Secondary | ICD-10-CM | POA: Diagnosis present

## 2017-01-24 DIAGNOSIS — Z8661 Personal history of infections of the central nervous system: Secondary | ICD-10-CM

## 2017-01-24 DIAGNOSIS — J398 Other specified diseases of upper respiratory tract: Secondary | ICD-10-CM | POA: Diagnosis not present

## 2017-01-24 DIAGNOSIS — S72002A Fracture of unspecified part of neck of left femur, initial encounter for closed fracture: Secondary | ICD-10-CM | POA: Diagnosis not present

## 2017-01-24 DIAGNOSIS — Z853 Personal history of malignant neoplasm of breast: Secondary | ICD-10-CM

## 2017-01-24 DIAGNOSIS — J9600 Acute respiratory failure, unspecified whether with hypoxia or hypercapnia: Secondary | ICD-10-CM

## 2017-01-24 DIAGNOSIS — Z419 Encounter for procedure for purposes other than remedying health state, unspecified: Secondary | ICD-10-CM

## 2017-01-24 DIAGNOSIS — J40 Bronchitis, not specified as acute or chronic: Secondary | ICD-10-CM | POA: Diagnosis present

## 2017-01-24 DIAGNOSIS — K5900 Constipation, unspecified: Secondary | ICD-10-CM

## 2017-01-24 DIAGNOSIS — Z881 Allergy status to other antibiotic agents status: Secondary | ICD-10-CM

## 2017-01-24 LAB — URINALYSIS, ROUTINE W REFLEX MICROSCOPIC
BILIRUBIN URINE: NEGATIVE
Bacteria, UA: NONE SEEN
GLUCOSE, UA: 50 mg/dL — AB
Hgb urine dipstick: NEGATIVE
KETONES UR: NEGATIVE mg/dL
LEUKOCYTES UA: NEGATIVE
Nitrite: NEGATIVE
PH: 6 (ref 5.0–8.0)
Protein, ur: 30 mg/dL — AB
Specific Gravity, Urine: 1.017 (ref 1.005–1.030)

## 2017-01-24 LAB — I-STAT VENOUS BLOOD GAS, ED
ACID-BASE EXCESS: 6 mmol/L — AB (ref 0.0–2.0)
Bicarbonate: 28.9 mmol/L — ABNORMAL HIGH (ref 20.0–28.0)
O2 SAT: 88 %
PO2 VEN: 47 mmHg — AB (ref 32.0–45.0)
TCO2: 30 mmol/L (ref 22–32)
pCO2, Ven: 34.7 mmHg — ABNORMAL LOW (ref 44.0–60.0)
pH, Ven: 7.529 — ABNORMAL HIGH (ref 7.250–7.430)

## 2017-01-24 LAB — CBC WITH DIFFERENTIAL/PLATELET
BASOS ABS: 0 10*3/uL (ref 0.0–0.1)
BASOS PCT: 0 %
Eosinophils Absolute: 0 10*3/uL (ref 0.0–0.7)
Eosinophils Relative: 0 %
HEMATOCRIT: 37 % (ref 36.0–46.0)
HEMOGLOBIN: 12 g/dL (ref 12.0–15.0)
Lymphocytes Relative: 6 %
Lymphs Abs: 0.9 10*3/uL (ref 0.7–4.0)
MCH: 29.4 pg (ref 26.0–34.0)
MCHC: 32.4 g/dL (ref 30.0–36.0)
MCV: 90.7 fL (ref 78.0–100.0)
Monocytes Absolute: 1.3 10*3/uL — ABNORMAL HIGH (ref 0.1–1.0)
Monocytes Relative: 9 %
NEUTROS ABS: 12.7 10*3/uL — AB (ref 1.7–7.7)
NEUTROS PCT: 85 %
Platelets: 248 10*3/uL (ref 150–400)
RBC: 4.08 MIL/uL (ref 3.87–5.11)
RDW: 15.9 % — ABNORMAL HIGH (ref 11.5–15.5)
WBC: 14.9 10*3/uL — ABNORMAL HIGH (ref 4.0–10.5)

## 2017-01-24 LAB — RESPIRATORY PANEL BY PCR
Adenovirus: NOT DETECTED
BORDETELLA PERTUSSIS-RVPCR: NOT DETECTED
CORONAVIRUS HKU1-RVPPCR: NOT DETECTED
CORONAVIRUS NL63-RVPPCR: NOT DETECTED
CORONAVIRUS OC43-RVPPCR: NOT DETECTED
Chlamydophila pneumoniae: NOT DETECTED
Coronavirus 229E: NOT DETECTED
Influenza A: NOT DETECTED
Influenza B: NOT DETECTED
METAPNEUMOVIRUS-RVPPCR: NOT DETECTED
Mycoplasma pneumoniae: NOT DETECTED
PARAINFLUENZA VIRUS 1-RVPPCR: NOT DETECTED
PARAINFLUENZA VIRUS 2-RVPPCR: NOT DETECTED
PARAINFLUENZA VIRUS 3-RVPPCR: NOT DETECTED
Parainfluenza Virus 4: NOT DETECTED
RHINOVIRUS / ENTEROVIRUS - RVPPCR: NOT DETECTED
Respiratory Syncytial Virus: NOT DETECTED

## 2017-01-24 LAB — PROTIME-INR
INR: 1.08
Prothrombin Time: 13.9 seconds (ref 11.4–15.2)

## 2017-01-24 LAB — COMPREHENSIVE METABOLIC PANEL
ALT: 25 U/L (ref 14–54)
AST: 53 U/L — AB (ref 15–41)
Albumin: 3.1 g/dL — ABNORMAL LOW (ref 3.5–5.0)
Alkaline Phosphatase: 113 U/L (ref 38–126)
Anion gap: 11 (ref 5–15)
BUN: 22 mg/dL — AB (ref 6–20)
CHLORIDE: 92 mmol/L — AB (ref 101–111)
CO2: 26 mmol/L (ref 22–32)
Calcium: 8.9 mg/dL (ref 8.9–10.3)
Creatinine, Ser: 0.4 mg/dL — ABNORMAL LOW (ref 0.44–1.00)
Glucose, Bld: 134 mg/dL — ABNORMAL HIGH (ref 65–99)
POTASSIUM: 4.1 mmol/L (ref 3.5–5.1)
SODIUM: 129 mmol/L — AB (ref 135–145)
Total Bilirubin: 0.9 mg/dL (ref 0.3–1.2)
Total Protein: 6.2 g/dL — ABNORMAL LOW (ref 6.5–8.1)

## 2017-01-24 LAB — I-STAT CG4 LACTIC ACID, ED
LACTIC ACID, VENOUS: 2.09 mmol/L — AB (ref 0.5–1.9)
LACTIC ACID, VENOUS: 2.32 mmol/L — AB (ref 0.5–1.9)

## 2017-01-24 MED ORDER — SODIUM CHLORIDE 0.9% FLUSH
3.0000 mL | Freq: Two times a day (BID) | INTRAVENOUS | Status: DC
  Administered 2017-01-24 – 2017-01-25 (×2): 3 mL via INTRAVENOUS

## 2017-01-24 MED ORDER — AZTREONAM 2 G IJ SOLR
2.0000 g | Freq: Once | INTRAMUSCULAR | Status: AC
  Administered 2017-01-24: 2 g via INTRAVENOUS
  Filled 2017-01-24: qty 2

## 2017-01-24 MED ORDER — OSMOLITE 1.5 CAL PO LIQD
237.0000 mL | Freq: Two times a day (BID) | ORAL | Status: DC
  Administered 2017-01-25 – 2017-02-02 (×17): 237 mL
  Filled 2017-01-24 (×22): qty 237

## 2017-01-24 MED ORDER — ACETAMINOPHEN 500 MG PO TABS: 500.0000 mg | ORAL_TABLET | Freq: Three times a day (TID) | ORAL | Status: DC

## 2017-01-24 MED ORDER — ATORVASTATIN CALCIUM 20 MG PO TABS
20.0000 mg | ORAL_TABLET | Freq: Every day | ORAL | Status: DC
  Administered 2017-01-24 – 2017-02-01 (×9): 20 mg
  Filled 2017-01-24 (×10): qty 1

## 2017-01-24 MED ORDER — PROPYLENE GLYCOL 0.6 % OP SOLN: 1.0000 [drp] | Freq: Every day | OPHTHALMIC | Status: DC

## 2017-01-24 MED ORDER — PANTOPRAZOLE SODIUM 40 MG PO PACK
40.0000 mg | PACK | Freq: Every day | ORAL | Status: DC
  Administered 2017-01-25 – 2017-02-02 (×7): 40 mg
  Filled 2017-01-24 (×9): qty 20

## 2017-01-24 MED ORDER — ALBUTEROL SULFATE (2.5 MG/3ML) 0.083% IN NEBU: 2.5000 mg | INHALATION_SOLUTION | RESPIRATORY_TRACT | Status: DC | PRN

## 2017-01-24 MED ORDER — SENNA 8.6 MG PO TABS
1.0000 | ORAL_TABLET | Freq: Two times a day (BID) | ORAL | Status: DC
  Administered 2017-01-24 – 2017-02-02 (×17): 8.6 mg via ORAL
  Filled 2017-01-24 (×17): qty 1

## 2017-01-24 MED ORDER — BISACODYL 10 MG RE SUPP
10.0000 mg | Freq: Every day | RECTAL | Status: DC | PRN
  Administered 2017-01-25: 10 mg via RECTAL
  Filled 2017-01-24: qty 1

## 2017-01-24 MED ORDER — VALACYCLOVIR HCL 500 MG PO TABS
500.0000 mg | ORAL_TABLET | Freq: Every day | ORAL | Status: DC
  Administered 2017-01-25 – 2017-02-02 (×8): 500 mg
  Filled 2017-01-24 (×9): qty 1

## 2017-01-24 MED ORDER — ALBUTEROL SULFATE (2.5 MG/3ML) 0.083% IN NEBU: 2.5000 mg | INHALATION_SOLUTION | Freq: Four times a day (QID) | RESPIRATORY_TRACT | Status: DC | PRN

## 2017-01-24 MED ORDER — VITAMIN C 500 MG PO TABS
500.0000 mg | ORAL_TABLET | Freq: Every day | ORAL | Status: DC
  Administered 2017-01-25 – 2017-02-02 (×8): 500 mg
  Filled 2017-01-24 (×8): qty 1

## 2017-01-24 MED ORDER — BACLOFEN 5 MG HALF TABLET
5.0000 mg | ORAL_TABLET | Freq: Four times a day (QID) | ORAL | Status: DC
  Administered 2017-01-24 – 2017-02-02 (×35): 5 mg
  Filled 2017-01-24 (×37): qty 1

## 2017-01-24 MED ORDER — VITAMIN D 1000 UNITS PO TABS
2000.0000 [IU] | ORAL_TABLET | Freq: Every day | ORAL | Status: DC
  Administered 2017-01-25 – 2017-02-02 (×8): 2000 [IU] via ORAL
  Filled 2017-01-24 (×8): qty 2

## 2017-01-24 MED ORDER — SODIUM CHLORIDE 0.9 % IV BOLUS (SEPSIS)
1000.0000 mL | Freq: Once | INTRAVENOUS | Status: AC
  Administered 2017-01-24: 1000 mL via INTRAVENOUS

## 2017-01-24 MED ORDER — IBUPROFEN 100 MG/5ML PO SUSP
400.0000 mg | Freq: Four times a day (QID) | ORAL | Status: DC | PRN
  Filled 2017-01-24: qty 20

## 2017-01-24 MED ORDER — CALCIUM CARBONATE-VITAMIN D3 600-400 MG-UNIT PO TABS: ORAL_TABLET | Freq: Every day | ORAL | Status: DC

## 2017-01-24 MED ORDER — PRIMIDONE 50 MG PO TABS: 50.0000 mg | ORAL_TABLET | Freq: Four times a day (QID) | ORAL | Status: DC

## 2017-01-24 MED ORDER — FUROSEMIDE 10 MG/ML IJ SOLN
20.0000 mg | Freq: Once | INTRAMUSCULAR | Status: AC
Start: 2017-01-24 — End: 2017-01-24
  Administered 2017-01-24: 20 mg via INTRAVENOUS
  Filled 2017-01-24: qty 2

## 2017-01-24 MED ORDER — VANCOMYCIN HCL IN DEXTROSE 1-5 GM/200ML-% IV SOLN
1000.0000 mg | Freq: Once | INTRAVENOUS | Status: AC
  Administered 2017-01-24: 1000 mg via INTRAVENOUS
  Filled 2017-01-24: qty 200

## 2017-01-24 MED ORDER — ACETAMINOPHEN 650 MG RE SUPP: 650.0000 mg | Freq: Four times a day (QID) | RECTAL | Status: DC | PRN

## 2017-01-24 MED ORDER — OSMOLITE 1.5 CAL PO LIQD: 40.0000 mL/h | ORAL | Status: DC

## 2017-01-24 MED ORDER — POLYETHYLENE GLYCOL 3350 17 G PO PACK
17.0000 g | PACK | Freq: Every day | ORAL | Status: DC | PRN
  Administered 2017-01-25 – 2017-01-28 (×2): 17 g via ORAL
  Filled 2017-01-24: qty 1

## 2017-01-24 MED ORDER — ONDANSETRON HCL 4 MG/2ML IJ SOLN
4.0000 mg | Freq: Four times a day (QID) | INTRAMUSCULAR | Status: DC | PRN
Start: 2017-01-24 — End: 2017-01-30

## 2017-01-24 MED ORDER — FREE WATER
200.0000 mL | Freq: Four times a day (QID) | Status: DC
  Administered 2017-01-24 – 2017-02-02 (×33): 200 mL

## 2017-01-24 MED ORDER — BROMOCRIPTINE MESYLATE 2.5 MG PO TABS
5.0000 mg | ORAL_TABLET | Freq: Every day | ORAL | Status: DC
  Administered 2017-01-25 – 2017-02-02 (×8): 5 mg via ORAL
  Filled 2017-01-24 (×9): qty 2

## 2017-01-24 MED ORDER — VANCOMYCIN HCL IN DEXTROSE 750-5 MG/150ML-% IV SOLN
750.0000 mg | Freq: Three times a day (TID) | INTRAVENOUS | Status: DC
  Administered 2017-01-24 – 2017-01-25 (×4): 750 mg via INTRAVENOUS
  Filled 2017-01-24 (×5): qty 150

## 2017-01-24 MED ORDER — HYPROMELLOSE (GONIOSCOPIC) 2.5 % OP SOLN
1.0000 [drp] | Freq: Every day | OPHTHALMIC | Status: DC
  Filled 2017-01-24: qty 15

## 2017-01-24 MED ORDER — POLYVINYL ALCOHOL 1.4 % OP SOLN
1.0000 [drp] | Freq: Every day | OPHTHALMIC | Status: DC
  Administered 2017-01-25 – 2017-02-02 (×8): 1 [drp] via OPHTHALMIC
  Filled 2017-01-24: qty 15

## 2017-01-24 MED ORDER — SACCHAROMYCES BOULARDII 250 MG PO CAPS
250.0000 mg | ORAL_CAPSULE | Freq: Two times a day (BID) | ORAL | Status: DC
  Administered 2017-01-24 – 2017-02-02 (×17): 250 mg
  Filled 2017-01-24 (×17): qty 1

## 2017-01-24 MED ORDER — ACETAMINOPHEN 500 MG PO TABS
500.0000 mg | ORAL_TABLET | Freq: Three times a day (TID) | ORAL | Status: DC
  Administered 2017-01-24 – 2017-02-02 (×28): 500 mg
  Filled 2017-01-24 (×28): qty 1

## 2017-01-24 MED ORDER — HEPARIN SODIUM (PORCINE) 5000 UNIT/ML IJ SOLN
5000.0000 [IU] | Freq: Three times a day (TID) | INTRAMUSCULAR | Status: DC
  Administered 2017-01-24 – 2017-01-29 (×15): 5000 [IU] via SUBCUTANEOUS
  Filled 2017-01-24 (×16): qty 1

## 2017-01-24 MED ORDER — SODIUM CHLORIDE 0.9 % IV SOLN: 250.0000 mL | INTRAVENOUS | Status: DC | PRN

## 2017-01-24 MED ORDER — KETOCONAZOLE 2 % EX CREA
1.0000 "application " | TOPICAL_CREAM | Freq: Two times a day (BID) | CUTANEOUS | Status: DC
  Administered 2017-01-24 – 2017-02-02 (×16): 1 via TOPICAL
  Filled 2017-01-24: qty 15

## 2017-01-24 MED ORDER — TRAZODONE HCL 50 MG PO TABS
50.0000 mg | ORAL_TABLET | Freq: Every evening | ORAL | Status: DC | PRN
Start: 2017-01-24 — End: 2017-02-02

## 2017-01-24 MED ORDER — AMANTADINE HCL 50 MG/5ML PO SYRP
150.0000 mg | ORAL_SOLUTION | Freq: Two times a day (BID) | ORAL | Status: DC
  Administered 2017-01-24 – 2017-02-02 (×17): 150 mg
  Filled 2017-01-24 (×18): qty 15

## 2017-01-24 MED ORDER — DEXTROSE 5 % IV SOLN
1.0000 g | Freq: Three times a day (TID) | INTRAVENOUS | Status: DC
  Administered 2017-01-24 – 2017-01-29 (×17): 1 g via INTRAVENOUS
  Filled 2017-01-24 (×18): qty 1

## 2017-01-24 MED ORDER — PRIMIDONE 50 MG PO TABS
50.0000 mg | ORAL_TABLET | Freq: Four times a day (QID) | ORAL | Status: DC
  Administered 2017-01-24 – 2017-02-02 (×36): 50 mg
  Filled 2017-01-24 (×38): qty 1

## 2017-01-24 MED ORDER — SODIUM CHLORIDE 3 % IN NEBU
4.0000 mL | INHALATION_SOLUTION | Freq: Two times a day (BID) | RESPIRATORY_TRACT | Status: AC | PRN
  Filled 2017-01-24: qty 4

## 2017-01-24 MED ORDER — MELATONIN 3 MG PO TABS
6.0000 mg | ORAL_TABLET | Freq: Every day | ORAL | Status: DC
  Administered 2017-01-24 – 2017-02-01 (×9): 6 mg
  Filled 2017-01-24 (×10): qty 2

## 2017-01-24 MED ORDER — GABAPENTIN 100 MG PO CAPS: 100.0000 mg | ORAL_CAPSULE | Freq: Two times a day (BID) | ORAL | Status: DC

## 2017-01-24 MED ORDER — OSMOLITE 1.5 CAL PO LIQD
1000.0000 mL | ORAL | Status: AC
  Administered 2017-01-24: 1000 mL
  Filled 2017-01-24 (×3): qty 1000

## 2017-01-24 MED ORDER — ADULT MULTIVITAMIN LIQUID CH
15.0000 mL | Freq: Every day | ORAL | Status: DC
  Administered 2017-01-25 – 2017-02-02 (×8): 15 mL
  Filled 2017-01-24 (×9): qty 15

## 2017-01-24 MED ORDER — CARBIDOPA-LEVODOPA 25-100 MG PO TABS
2.0000 | ORAL_TABLET | Freq: Three times a day (TID) | ORAL | Status: DC
  Administered 2017-01-24: 2
  Filled 2017-01-24 (×2): qty 2

## 2017-01-24 MED ORDER — BETHANECHOL CHLORIDE 5 MG PO TABS: 5.0000 mg | ORAL_TABLET | Freq: Every day | ORAL | Status: DC

## 2017-01-24 MED ORDER — BETHANECHOL CHLORIDE 5 MG PO TABS
5.0000 mg | ORAL_TABLET | Freq: Every day | ORAL | Status: DC
  Administered 2017-01-25 – 2017-02-02 (×8): 5 mg
  Filled 2017-01-24 (×9): qty 1

## 2017-01-24 MED ORDER — ALBUTEROL SULFATE (2.5 MG/3ML) 0.083% IN NEBU
2.5000 mg | INHALATION_SOLUTION | Freq: Two times a day (BID) | RESPIRATORY_TRACT | Status: DC
  Administered 2017-01-24 – 2017-02-02 (×17): 2.5 mg via RESPIRATORY_TRACT
  Filled 2017-01-24 (×17): qty 3

## 2017-01-24 MED ORDER — ACETAMINOPHEN 325 MG PO TABS
650.0000 mg | ORAL_TABLET | Freq: Four times a day (QID) | ORAL | Status: DC | PRN
  Filled 2017-01-24: qty 2

## 2017-01-24 MED ORDER — GUAIFENESIN 100 MG/5ML PO SOLN
10.0000 mL | Freq: Four times a day (QID) | ORAL | Status: DC
  Administered 2017-01-24 – 2017-02-02 (×35): 200 mg
  Filled 2017-01-24 (×4): qty 10
  Filled 2017-01-24: qty 5
  Filled 2017-01-24 (×4): qty 10
  Filled 2017-01-24 (×2): qty 5
  Filled 2017-01-24 (×3): qty 10
  Filled 2017-01-24 (×2): qty 5
  Filled 2017-01-24 (×2): qty 10
  Filled 2017-01-24 (×2): qty 5
  Filled 2017-01-24 (×7): qty 10
  Filled 2017-01-24: qty 5
  Filled 2017-01-24 (×2): qty 10
  Filled 2017-01-24: qty 5
  Filled 2017-01-24 (×5): qty 10

## 2017-01-24 MED ORDER — DEXTROSE-NACL 5-0.45 % IV SOLN
INTRAVENOUS | Status: DC
  Administered 2017-01-25: 19:00:00 via INTRAVENOUS

## 2017-01-24 MED ORDER — BACLOFEN 10 MG PO TABS: 10.0000 mg | ORAL_TABLET | Freq: Four times a day (QID) | ORAL | Status: DC

## 2017-01-24 MED ORDER — COQ10 30 MG PO CAPS: ORAL_CAPSULE | Freq: Every day | ORAL | Status: DC

## 2017-01-24 MED ORDER — CALCIUM CARBONATE-VITAMIN D 500-200 MG-UNIT PO TABS
1.0000 | ORAL_TABLET | Freq: Every day | ORAL | Status: DC
  Administered 2017-01-25 – 2017-02-02 (×8): 1
  Filled 2017-01-24 (×9): qty 1

## 2017-01-24 MED ORDER — POTASSIUM CHLORIDE 20 MEQ/15ML (10%) PO SOLN
20.0000 meq | Freq: Every day | ORAL | Status: DC
  Administered 2017-01-25 – 2017-01-26 (×2): 20 meq
  Filled 2017-01-24 (×2): qty 15

## 2017-01-24 MED ORDER — SODIUM CHLORIDE 0.9% FLUSH: 3.0000 mL | INTRAVENOUS | Status: DC | PRN

## 2017-01-24 MED ORDER — SODIUM CHLORIDE 3 % IN NEBU
4.0000 mL | INHALATION_SOLUTION | Freq: Two times a day (BID) | RESPIRATORY_TRACT | Status: AC
  Administered 2017-01-24 – 2017-01-26 (×5): 4 mL via RESPIRATORY_TRACT
  Filled 2017-01-24 (×7): qty 4

## 2017-01-24 MED ORDER — CARBIDOPA-LEVODOPA 25-100 MG PO TABS
2.0000 | ORAL_TABLET | Freq: Three times a day (TID) | ORAL | Status: DC
  Administered 2017-01-24 – 2017-02-02 (×26): 2
  Filled 2017-01-24 (×28): qty 2

## 2017-01-24 MED ORDER — AMLODIPINE BESYLATE 10 MG PO TABS
10.0000 mg | ORAL_TABLET | Freq: Every day | ORAL | Status: DC
  Administered 2017-01-25 – 2017-02-02 (×8): 10 mg
  Filled 2017-01-24 (×2): qty 2
  Filled 2017-01-24: qty 1
  Filled 2017-01-24 (×3): qty 2
  Filled 2017-01-24 (×2): qty 1

## 2017-01-24 MED ORDER — CHLORHEXIDINE GLUCONATE 0.12 % MT SOLN
15.0000 mL | Freq: Two times a day (BID) | OROMUCOSAL | Status: DC
  Administered 2017-01-24 – 2017-02-02 (×17): 15 mL via OROMUCOSAL
  Filled 2017-01-24 (×18): qty 15

## 2017-01-24 MED ORDER — LORATADINE 10 MG PO TABS
10.0000 mg | ORAL_TABLET | Freq: Every day | ORAL | Status: DC
  Administered 2017-01-25 – 2017-02-02 (×8): 10 mg
  Filled 2017-01-24 (×8): qty 1

## 2017-01-24 MED ORDER — OSMOLITE 1.5 CAL PO LIQD
1560.0000 mL | Freq: Every day | ORAL | Status: DC
  Filled 2017-01-24 (×2): qty 2000

## 2017-01-24 MED ORDER — GABAPENTIN 250 MG/5ML PO SOLN
100.0000 mg | Freq: Two times a day (BID) | ORAL | Status: DC
  Administered 2017-01-25 – 2017-02-02 (×16): 100 mg
  Filled 2017-01-24 (×23): qty 2

## 2017-01-24 MED ORDER — ONDANSETRON HCL 4 MG PO TABS: 4.0000 mg | ORAL_TABLET | Freq: Four times a day (QID) | ORAL | Status: DC | PRN

## 2017-01-24 MED ORDER — FLUTICASONE PROPIONATE 50 MCG/ACT NA SUSP
1.0000 | Freq: Every day | NASAL | Status: DC
  Administered 2017-01-25 – 2017-02-02 (×8): 1 via NASAL
  Filled 2017-01-24: qty 16

## 2017-01-24 NOTE — H&P (Signed)
Patient Demographics:    Judy Spears, is a 66 y.o. female  MRN: [id number]   DOB - 03-Jan-1951  Admit Date - 01/24/2017  Outpatient Primary MD for the patient is Marton Redwood, MD   Assessment & Plan:    Principal Problem:   Acute tracheobronchitis Active Problems:   Encephalitis, arthropod-borne viral   Tracheostomy status (HCC)   Spastic tetraplegia (HCC)   Chronic respiratory failure (HCC)   Tracheobronchitis   PEG (percutaneous endoscopic gastrostomy) status ([place])   Benign essential HTN   Hyponatremia   Hypokalemia   Chronic mucus hypersecretion, respiratory    1)Acute on chronic Hypoxic Respiratory Failure- currently hypoxic, patient has history of recurrent mucous plugging, at this time given altered mentation, leukocytosis (14.9)  and hypoxia there is suspicion of HCAP, given history of prior MRSA and Pseudomonas respiratory infection Dr. Lake Bells advises vancomycin and cefepime at this time pending sputum and blood cultures. PCCM consult appreciated, they will  continue to follow, pulmonary toilet, chest PT, bronchodilators and mucolytics, respiratory panel including influenza is negative, however Gram stain noted with organisms polymorphs.  Admit to stepdown unit, respiratory therapy consult to help with trach management  2)FEN-resume tube feed, hyponatremia noted, nutritional consult requested  3)Spastic Tetraplegia-continue baclofen, Sinemet, gabapentin and bromocriptine  4)Social/Ethics-plan of care and CODE STATUS discussed with patient's husband at bedside, she is a full code   With History of - Reviewed by me  Past Medical History:  Diagnosis Date  . Arthritis    lt great toe  . Complication of anesthesia    pt has had headaches post op-did advise to hydra well preop  . Hair loss    right-sided  . History of exercise stress test    a. ETT (12/15) with 12:00 exercise, no ST changes, occasional PACs.   . Hyperlipidemia   . Insomnia   . Migraine headache   . Movement disorder    resltess in left legs  . Postmenopausal   . 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 Premature atrial contractions    a. Holter (12/15) with 8% PACs, no atrial runs or atrial fibrillation.       Past Surgical History:  Procedure Laterality Date  . BREAST BIOPSY  01/01/2011   Procedure: BREAST BIOPSY WITH NEEDLE LOCALIZATION;  Surgeon: Rolm Bookbinder, MD;  Location: [facility];  Service: General;  Laterality: Left;  left breast wire localization biopsy  . BREAST BIOPSY Right 07/27/2012  . BREAST BIOPSY Right 07/07/2016  . BREAST BIOPSY Left 06/30/2016  . cataracts right eye    . CESAREAN SECTION  1986  . HERNIA REPAIR  2000   [facility]  . IR [place] TUBE PERCUT W/FLUORO  09/29/2016  . opirectinal membrane peel    . PEG PLACEMENT    . RHINOPLASTY  1983  . TRACHEOSTOMY    . TRACHEOSTOMY TUBE PLACEMENT N/A 12/15/2016   Procedure: TRACHEOSTOMY CHANGE;  Surgeon: [name] Montane, MD;  Location: [facility]  OR;  Service: ENT;  Laterality: N/A;      Chief Complaint  Patient presents with  . Respiratory Distress      HPI:    Judy Spears  is a 66 y.o. 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  Patient was recently admitted 12/05/16 through 01/04/17 for acute on chronic respiratory failure,  Following that hospitalization, she was transferred to [facility] for in hospital rehab.  Per family, she was just discharged from there on 01/21/17 and since discharge, had been doing well until morning of 01/24/17 when she had dyspnea and hypoxia (SpO2 in 80s).  Family  attempted chest PT manuevers (which they were coached on during previous admissions), but symptoms did not improve, therefore, she was brought to ED for further eval.   Family felt that she might have had mucous plugging at home.  In ED, RT was able to suction several plugs out after saline lavage.  Patient is seen at bedside in conjunction with Dr. Lake Bells from PCCM who is very familiar with patient.  The patient has a history of MRSA and Pseudomonas respiratory infections, Dr. Lake Bells advises coverage for both organisms at this time  Patient is not alert, not able to interact at this time which is different from her recent baseline, most of the history is obtained from patient's husband and usual caregiver, as well as Dr. Lake Bells and accompanying EMR records.  Last BM was > 48 hours ago, no fevers.  Tube feed has been on hold since 7 AM on 01/24/2017  Respiratory panel including influenza is negative however sputum Gram stain as noted below  Sputum Gram stain with--- MODERATE WBC PRESENT, PREDOMINANTLY PMN , ABUNDANT GRAM NEGATIVE RODS , ABUNDANT GRAM POSITIVE COCCI , ABUNDANT GRAM POSITIVE RODS     Review of systems:    In addition to the HPI above,   A full 12 point Review of 10 Systems was done, except as stated above, all other Review of 10 Systems were negative.    Social History:  Reviewed by me    Social History   Tobacco Use  . Smoking status: Never Smoker  . Smokeless tobacco: Never Used  Substance Use Topics  . Alcohol use: Yes    Alcohol/week: 0.0 oz    Comment: Occasional.     Family History :  Reviewed by me    Family History  Problem Relation Age of Onset  . Stroke Mother        paralyzed on right side/aphasia  . Hypertension Mother        had stroke after stopping BP meds  . Colon cancer Father   . Prostate cancer Father   . Heart attack Maternal Grandfather 56  . Stroke Maternal Uncle 50     Home Medications:   Prior to Admission medications     Medication Sig Start Date End Date Taking? Authorizing Provider  acetaminophen (TYLENOL) 500 MG tablet Place 500 mg into feeding tube 3 (three) times daily. May give an additional 500mg  per g-tube three times daily as needed for mild/moderate pain   Yes [provider]  albuterol (PROAIR HFA) 108 (90 Base) MCG/ACT inhaler Inhale 2 puffs into the lungs 2 (two) times daily.   Yes [provider]  albuterol (PROVENTIL) (2.5 MG/3ML)  0.083% nebulizer solution Take 3 mLs (2.5 mg total) by nebulization every 6 (six) hours as needed for wheezing or shortness of breath. Patient taking differently: Take 2.5 mg every 6 (six) hours as needed by nebulization for wheezing or shortness of breath (congestion).  11/24/16  Yes Juanito Doom, MD  amantadine (SYMMETREL) 50 MG/5ML solution PLACE 19mL INTO FEEDING TUBE TWICE DAILY Patient taking differently: PLACE 15 ML (150MG )  INTO FEEDING TUBE EVERY 12 HOURS -  8AM AND NOON 11/25/16  Yes Meredith Staggers, MD  amLODipine (NORVASC) 10 MG tablet Place 1 tablet (10 mg total) into feeding tube daily. 10/27/16 10/27/17 Yes Love, Ivan Anchors, PA-C  atorvastatin (LIPITOR) 20 MG tablet Place 20 mg into feeding tube at bedtime.    Yes [provider]  baclofen (LIORESAL) 10 MG tablet PLACE 1 TABLET INTO FEEDING TUBE EVERY 6HOURS Patient taking differently: PLACE 1 TABLET (10 MG) INTO FEEDING TUBE FOUR TIMES DAILY 12/02/16  Yes Meredith Staggers, MD  bethanechol (URECHOLINE) 5 MG tablet Take 1 tablet (5 mg total) by mouth daily. 01/05/17  Yes Nita Sells, MD  bromocriptine (PARLODEL) 2.5 MG tablet Take 2.5 mg by mouth daily.   Yes [provider]  Calcium Carb-Cholecalciferol (CALCIUM CARBONATE-VITAMIN D3 PO) Place 2 tablets into feeding tube daily.   Yes [provider]  carbidopa-levodopa (SINEMET IR) 25-100 MG tablet Place 2 tablets into feeding tube 3 (three) times daily.   Yes [provider]  chlorhexidine  (PERIDEX) 0.12 % solution 59ml IN THE MOUTH OR THROAT TWICE DAILY Patient taking differently: 15ML INTO THE MOUTH/THROAT TWICE DAILY 12/02/16  Yes Meredith Staggers, MD  Cholecalciferol (VITAMIN D) 2000 units CAPS Place 2,000 Units daily into feeding tube.   Yes [provider]  Coenzyme Q10 (COQ10 PO) Place 10 mLs into feeding tube daily. 0600   Yes [provider]  fluticasone (FLONASE) 50 MCG/ACT nasal spray Place 1 spray into both nostrils daily.    Yes [provider]  gabapentin (NEURONTIN) 100 MG capsule One capsule--open and administer via PEG twice a day. Patient taking differently: 100 mg See admin instructions. Open one capsule (100 mg) and administer per tube twice daily - 8 am and 5pm 10/27/16  Yes Love, Ivan Anchors, PA-C  guaiFENesin (ROBITUSSIN) 100 MG/5ML SOLN Place 10 mLs (200 mg total) into feeding tube 4 (four) times daily. Patient taking differently: Place 10 mLs into feeding tube daily as needed for cough.  02/25/16  Yes Love, Ivan Anchors, PA-C  ibuprofen (ADVIL,MOTRIN) 100 MG/5ML suspension Place 400 mg into feeding tube every 6 (six) hours as needed for mild pain or moderate pain.   Yes [provider]  ketoconazole (NIZORAL) 2 % cream Apply 1 application topically 2 (two) times daily.   Yes [provider]  loratadine (CLARITIN) 10 MG tablet Place 1 tablet (10 mg total) into feeding tube daily as needed for allergies. Patient taking differently: Place 10 mg into feeding tube daily.  02/25/16  Yes Love, Ivan Anchors, PA-C  Melatonin 5 MG CAPS Place 5 mg at bedtime into feeding tube.    Yes [provider]  Multiple Vitamin (MULTIVITAMIN) LIQD Place 15 mLs into feeding tube daily. Patient taking differently: Place 15 mLs into feeding tube daily. 0600 05/11/16  Yes Ollis, Brandi L, NP  Nutritional Supplements (FEEDING SUPPLEMENT, OSMOLITE 1.5 CAL,) LIQD Place 1,000 mLs into feeding tube daily. Patient taking differently: Place into feeding  tube See admin instructions. Give nightly continuous feed for  13 hours at 161ml/hour. Give two bolus feeds of 259ml in between sinemet doses. 09/30/16  Yes Patrecia Pour, Christean Grief, MD  pantoprazole sodium (PROTONIX) 40 mg/20 mL PACK Place 40 mg into feeding tube daily.   Yes [provider]  potassium chloride 20 MEQ/15ML (10%) SOLN Place 15 mLs (20 mEq total) into feeding tube daily. 05/05/16  Yes Meredith Staggers, MD  primidone (MYSOLINE) 50 MG tablet Place 1 tablet (50 mg total) into feeding tube 4 (four) times daily. 10/27/16  Yes Love, Ivan Anchors, PA-C  Propylene Glycol (SYSTANE BALANCE) 0.6 % SOLN Place 1 drop into both eyes daily.   Yes [provider]  saccharomyces boulardii (FLORASTOR) 250 MG capsule Place 250 mg into feeding tube 2 (two) times daily.   Yes [provider]  valACYclovir (VALTREX) 500 MG tablet Place 500 mg daily into feeding tube.    Yes [provider]  vitamin C (ASCORBIC ACID) 500 MG tablet Place 500 mg daily into feeding tube.   Yes [provider]  Water For Irrigation, Sterile (FREE WATER) SOLN Place 200 mLs into feeding tube every 8 (eight) hours. Patient taking differently: Place 200 mLs 3 (three) times daily into feeding tube.  10/27/16  Yes Love, Ivan Anchors, PA-C  Water For Irrigation, Sterile (FREE WATER) SOLN Place 100 mLs into feeding tube every 8 (eight) hours. 01/04/17  Yes Nita Sells, MD  Amino Acids-Protein Hydrolys (FEEDING SUPPLEMENT, PRO-STAT SUGAR FREE 64,) LIQD Place 30 mLs into feeding tube 2 (two) times daily. Patient not taking: Reported on 01/24/2017 10/27/16   Love, Ivan Anchors, PA-C  bethanechol (URECHOLINE) 5 MG tablet 5 mg BID x 1 week, then 5mg  QD x 1 week and then stop Patient not taking: Reported on 01/24/2017 12/31/16   Donita Brooks, NP  clotrimazole (LOTRIMIN) 1 % cream Apply topically 2 (two) times daily. Patient not taking: Reported on 01/24/2017 10/27/16   Love, Ivan Anchors, PA-C  sodium chloride  HYPERTONIC 3 % nebulizer solution Take by nebulization 2 (two) times daily as needed for other. Patient not taking: Reported on 01/24/2017 11/24/16   Juanito Doom, MD     Allergies:     Allergies  Allergen Reactions  . Cefepime Other (See Comments)    Status epilepticus- this reaction is questionable per husband.States that it was determined that pt did not have seizure activity. Status epilepticus  . Tetracyclines & Related Photosensitivity and Other (See Comments)    Blisters (childhood allergy)  . Doxycycline Monohydrate Other (See Comments)    Blisters, photosensitivity- family state that the reaction is actually to tetracycline.  . Pollen Extract Other (See Comments)    Running nose (seasonal allergies)     Physical Exam:   Vitals  Blood pressure 99/61, pulse 93, temperature 98.2 F (36.8 C), temperature source Oral, resp. rate (!) 24, height 5\' 2"  (1.575 m), weight 46.3 kg (102 lb), last menstrual period 12/01/2010, SpO2 97 %.  Physical Examination: General appearance -chronically ill-appearing, cachectic, not really responsive at this time  mental status -lethargic, not very responsive Eyes - sclera anicteric Neck -tracheostomy with trach collar  chest -scattered rhonchi bilaterally  heart - S1 and S2 normal, 2/6 SM Abdomen - soft, nontender, nondistended, PEG tube site appears clean dry and intact Neurological -contractures/atrophy, apparently this is her baseline Extremities - no pedal edema noted, intact peripheral pulses  Skin - warm, dry, abdominal area ecchymosis from prior subcu heparin product injection during prior hospitalization apparently    Data Review:  CBC Recent Labs  Lab 01/24/17 1011  WBC 14.9*  HGB 12.0  HCT 37.0  PLT 248  MCV 90.7  MCH 29.4  MCHC 32.4  RDW 15.9*  LYMPHSABS 0.9  MONOABS 1.3*  EOSABS 0.0  BASOSABS 0.0    ------------------------------------------------------------------------------------------------------------------  Chemistries  Recent Labs  Lab 01/24/17 1011  NA 129*  K 4.1  CL 92*  CO2 26  GLUCOSE 134*  BUN 22*  CREATININE 0.40*  CALCIUM 8.9  AST 53*  ALT 25  ALKPHOS 113  BILITOT 0.9   ------------------------------------------------------------------------------------------------------------------ estimated creatinine clearance is 51.2 mL/min (A) (by C-G formula based on SCr of 0.4 mg/dL (L)). ------------------------------------------------------------------------------------------------------------------ No results for input(s): TSH, T4TOTAL, T3FREE, THYROIDAB in the last 72 hours.  Invalid input(s): FREET3   Coagulation profile Recent Labs  Lab 01/24/17 1011  INR 1.08   ------------------------------------------------------------------------------------------------------------------- No results for input(s): DDIMER in the last 72 hours. -------------------------------------------------------------------------------------------------------------------  Cardiac Enzymes No results for input(s): CKMB, TROPONINI, MYOGLOBIN in the last 168 hours.  Invalid input(s): CK ------------------------------------------------------------------------------------------------------------------ No results found for: BNP   ---------------------------------------------------------------------------------------------------------------  Urinalysis    Component Value Date/Time   COLORURINE YELLOW 01/24/2017 1257   APPEARANCEUR HAZY (A) 01/24/2017 1257   LABSPEC 1.017 01/24/2017 1257   PHURINE 6.0 01/24/2017 1257   GLUCOSEU 50 (A) 01/24/2017 1257   HGBUR NEGATIVE 01/24/2017 1257   [place] 01/24/2017 1257   [place] 01/24/2017 1257   PROTEINUR 30 (A) 01/24/2017 1257   NITRITE NEGATIVE 01/24/2017 1257   LEUKOCYTESUR NEGATIVE 01/24/2017 1257     ----------------------------------------------------------------------------------------------------------------   Imaging Results:    Dg Chest Port 1 View  Result Date: 01/24/2017 CLINICAL DATA:  Respiratory distress, tracheostomy patient. EXAM: PORTABLE CHEST 1 VIEW COMPARISON:  Chest x-ray of January 02, 2017 FINDINGS: The lungs are less well inflated today. There remain increased lung markings at the left lung base but the previously demonstrated confluent density has improved considerably. The heart is normal in size. The central pulmonary vascularity is mildly prominent but stable. There is no pleural effusion. The tracheostomy tube tip projects at the inferior margin of the clavicular heads. There is calcification in the wall of the aortic arch. The bony thorax exhibits no acute abnormality. A PEG tube is visible in the left upper abdomen. IMPRESSION: Improved appearance of the left lower lobe consistent with decreased atelectasis or pneumonia. No definite acute cardiopulmonary abnormality. Electronically Signed   By: David  Martinique M.D.   On: 01/24/2017 10:03    Radiological Exams on Admission: Dg Chest Port 1 View  Result Date: 01/24/2017 CLINICAL DATA:  Respiratory distress, tracheostomy patient. EXAM: PORTABLE CHEST 1 VIEW COMPARISON:  Chest x-ray of January 02, 2017 FINDINGS: The lungs are less well inflated today. There remain increased lung markings at the left lung base but the previously demonstrated confluent density has improved considerably. The heart is normal in size. The central pulmonary vascularity is mildly prominent but stable. There is no pleural effusion. The tracheostomy tube tip projects at the inferior margin of the clavicular heads. There is calcification in the wall of the aortic arch. The bony thorax exhibits no acute abnormality. A PEG tube is visible in the left upper abdomen. IMPRESSION: Improved appearance of the left lower lobe consistent with decreased  atelectasis or pneumonia. No definite acute cardiopulmonary abnormality. Electronically Signed   By: David  Martinique M.D.   On: 01/24/2017 10:03    DVT Prophylaxis -SCD   AM Labs Ordered, also please review Full Orders  Family Communication: Admission,  patients condition and plan of care including tests being ordered have been discussed with the patient and husband  who indicate understanding and agree with the plan   Code Status - Full Code  Likely DC to  Home   Condition   stable  Roxan Hockey M.D on 01/24/2017 at 3:52 PM   Between 7am to 7pm - Pager - [phone] After 7pm go to [email or web address] - password [email or web address]  [facility] - Office  [phone]  Voice Recognition Viviann Spare dictation system was used to create this note, attempts have been made to correct errors. Please contact the author with questions and/or clarifications.

## 2017-01-24 NOTE — Consult Note (Signed)
Name: Judy Spears MRN: [id number] DOB: 1950/08/12    ADMISSION DATE:  01/24/2017 CONSULTATION DATE:  01/24/17  REFERRING MD :  Rex Kras - EDP  CHIEF COMPLAINT:  SOB   HISTORY OF PRESENT ILLNESS:  Judy Spears is a 66 y.o. 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  Following that hospitalization, she was transferred to [facility] for in hospital rehab.  Per family, she was just discharged from there on 01/21/17 and since discharge, had been doing well until morning of 12/31 when she had dyspnea and hypoxia (SpO2 in 80s).  Family attempted chest PT manuevers (which they were coached on during previous admissions), but symptoms did not improve, therefore, she was brought to ED for further eval.   Family felt that she might have had mucous plugging at home.  In ED, RT was able to suction several plugs out after saline lavage.  She was admitted by [facility] for presumed HCAP and recurrent mucous plugging, and family had requested that CCM be consulted for assistance with tracheostomy care.  PAST MEDICAL HISTORY :   has a past medical history of Arthritis, Complication of anesthesia, Hair loss, History of exercise stress test, Hyperlipidemia, Insomnia, Migraine headache, Movement disorder, Postmenopausal, [place] encephalitis, and Premature atrial contractions.  has a past surgical history that includes Hernia repair (2000); Rhinoplasty (1983); Cesarean section (1986); opirectinal membrane peel; cataracts right eye; Breast biopsy (01/01/2011); Breast biopsy (Right, 07/27/2012); Breast biopsy (Right, 07/07/2016); Breast biopsy (Left, 06/30/2016); Tracheostomy; PEG placement; IR Replc Gastro/Colonic Tube Percut W/Fluoro (09/29/2016); and Tracheostomy tube  placement (N/A, 12/15/2016). Prior to Admission medications   Medication Sig Start Date End Date Taking? Authorizing Provider  acetaminophen (TYLENOL) 500 MG tablet Place 500 mg into feeding tube 3 (three) times daily. May give an additional 500mg  per g-tube three times daily as needed for mild/moderate pain   Yes [provider]  albuterol (PROAIR HFA) 108 (90 Base) MCG/ACT inhaler Inhale 2 puffs into the lungs 2 (two) times daily.   Yes [provider]  albuterol (PROVENTIL) (2.5 MG/3ML) 0.083% nebulizer solution Take 3 mLs (2.5 mg total) by nebulization every 6 (six) hours as needed for wheezing or shortness of breath. Patient taking differently: Take 2.5 mg every 6 (six) hours as needed by nebulization for wheezing or shortness of breath (congestion).  11/24/16  Yes Juanito Doom, MD  amantadine (SYMMETREL) 50 MG/5ML solution PLACE 25mL INTO FEEDING TUBE TWICE DAILY Patient taking differently: PLACE 15 ML (150MG )  INTO FEEDING TUBE EVERY 12 HOURS -  8AM AND NOON 11/25/16  Yes Meredith Staggers, MD  amLODipine (NORVASC) 10 MG tablet Place 1 tablet (10 mg total) into feeding tube daily. 10/27/16 10/27/17 Yes Love, Ivan Anchors, PA-C  atorvastatin (LIPITOR) 20 MG tablet Place 20 mg into feeding tube at bedtime.    Yes [provider]  baclofen (LIORESAL) 10 MG tablet PLACE 1 TABLET INTO FEEDING TUBE EVERY 6HOURS Patient taking differently: PLACE 1 TABLET (10 MG) INTO FEEDING TUBE FOUR TIMES DAILY 12/02/16  Yes Meredith Staggers, MD  bethanechol (URECHOLINE) 5 MG tablet Take 1 tablet (5 mg total) by mouth daily. 01/05/17  Yes Nita Sells, MD  bromocriptine (PARLODEL) 2.5 MG tablet Take 2.5 mg by mouth daily.   Yes [provider]  Calcium Carb-Cholecalciferol (CALCIUM  CARBONATE-VITAMIN D3 PO) Place 2 tablets into feeding tube daily.   Yes [provider]  carbidopa-levodopa (SINEMET IR) 25-100 MG tablet Place 2 tablets into feeding tube 3 (three)  times daily.   Yes [provider]  chlorhexidine (PERIDEX) 0.12 % solution 66ml IN THE MOUTH OR THROAT TWICE DAILY Patient taking differently: 15ML INTO THE MOUTH/THROAT TWICE DAILY 12/02/16  Yes Meredith Staggers, MD  Cholecalciferol (VITAMIN D) 2000 units CAPS Place 2,000 Units daily into feeding tube.   Yes [provider]  Coenzyme Q10 (COQ10 PO) Place 10 mLs into feeding tube daily. 0600   Yes [provider]  fluticasone (FLONASE) 50 MCG/ACT nasal spray Place 1 spray into both nostrils daily.    Yes [provider]  gabapentin (NEURONTIN) 100 MG capsule One capsule--open and administer via PEG twice a day. Patient taking differently: 100 mg See admin instructions. Open one capsule (100 mg) and administer per tube twice daily - 8 am and 5pm 10/27/16  Yes Love, Ivan Anchors, PA-C  guaiFENesin (ROBITUSSIN) 100 MG/5ML SOLN Place 10 mLs (200 mg total) into feeding tube 4 (four) times daily. Patient taking differently: Place 10 mLs into feeding tube daily as needed for cough.  02/25/16  Yes Love, Ivan Anchors, PA-C  ibuprofen (ADVIL,MOTRIN) 100 MG/5ML suspension Place 400 mg into feeding tube every 6 (six) hours as needed for mild pain or moderate pain.   Yes [provider]  ketoconazole (NIZORAL) 2 % cream Apply 1 application topically 2 (two) times daily.   Yes [provider]  loratadine (CLARITIN) 10 MG tablet Place 1 tablet (10 mg total) into feeding tube daily as needed for allergies. Patient taking differently: Place 10 mg into feeding tube daily.  02/25/16  Yes Love, Ivan Anchors, PA-C  Melatonin 5 MG CAPS Place 5 mg at bedtime into feeding tube.    Yes [provider]  Multiple Vitamin (MULTIVITAMIN) LIQD Place 15 mLs into feeding tube daily. Patient taking differently: Place 15 mLs into feeding tube daily. 0600 05/11/16  Yes Ollis, Brandi L, NP  Nutritional Supplements (FEEDING SUPPLEMENT, OSMOLITE 1.5 CAL,) LIQD Place 1,000 mLs into feeding  tube daily. Patient taking differently: Place into feeding tube See admin instructions. Give nightly continuous feed for 13 hours at 142ml/hour. Give two bolus feeds of 259ml in between sinemet doses. 09/30/16  Yes Patrecia Pour, Christean Grief, MD  pantoprazole sodium (PROTONIX) 40 mg/20 mL PACK Place 40 mg into feeding tube daily.   Yes [provider]  potassium chloride 20 MEQ/15ML (10%) SOLN Place 15 mLs (20 mEq total) into feeding tube daily. 05/05/16  Yes Meredith Staggers, MD  primidone (MYSOLINE) 50 MG tablet Place 1 tablet (50 mg total) into feeding tube 4 (four) times daily. 10/27/16  Yes Love, Ivan Anchors, PA-C  Propylene Glycol (SYSTANE BALANCE) 0.6 % SOLN Place 1 drop into both eyes daily.   Yes [provider]  saccharomyces boulardii (FLORASTOR) 250 MG capsule Place 250 mg into feeding tube 2 (two) times daily.   Yes [provider]  valACYclovir (VALTREX) 500 MG tablet Place 500 mg daily into feeding tube.    Yes [provider]  vitamin C (ASCORBIC ACID) 500 MG tablet Place 500 mg daily into feeding tube.   Yes [provider]  Water For Irrigation, Sterile (FREE WATER) SOLN Place 200 mLs into feeding tube every 8 (eight) hours. Patient taking differently: Place 200 mLs 3 (three) times daily into feeding tube.  10/27/16  Yes Love,  Ivan Anchors, PA-C  Water For Irrigation, Sterile (FREE WATER) SOLN Place 100 mLs into feeding tube every 8 (eight) hours. 01/04/17  Yes Nita Sells, MD  Amino Acids-Protein Hydrolys (FEEDING SUPPLEMENT, PRO-STAT SUGAR FREE 64,) LIQD Place 30 mLs into feeding tube 2 (two) times daily. Patient not taking: Reported on 01/24/2017 10/27/16   Love, Ivan Anchors, PA-C  bethanechol (URECHOLINE) 5 MG tablet 5 mg BID x 1 week, then 5mg  QD x 1 week and then stop Patient not taking: Reported on 01/24/2017 12/31/16   Donita Brooks, NP  clotrimazole (LOTRIMIN) 1 % cream Apply topically 2 (two) times daily. Patient not taking: Reported on  01/24/2017 10/27/16   Love, Ivan Anchors, PA-C  sodium chloride HYPERTONIC 3 % nebulizer solution Take by nebulization 2 (two) times daily as needed for other. Patient not taking: Reported on 01/24/2017 11/24/16   Juanito Doom, MD   Allergies  Allergen Reactions  . Cefepime Other (See Comments)    Status epilepticus- this reaction is questionable per husband.States that it was determined that pt did not have seizure activity. Status epilepticus  . Tetracyclines & Related Photosensitivity and Other (See Comments)    Blisters (childhood allergy)  . Doxycycline Monohydrate Other (See Comments)    Blisters, photosensitivity- family state that the reaction is actually to tetracycline.  . Pollen Extract Other (See Comments)    Running nose (seasonal allergies)    FAMILY HISTORY:  family history includes Colon cancer in her father; Heart attack (age of onset: 31) in her maternal grandfather; Hypertension in her mother; Prostate cancer in her father; Stroke in her mother; Stroke (age of onset: 39) in her maternal uncle. SOCIAL HISTORY:  reports that  has never smoked. she has never used smokeless tobacco. She reports that she drinks alcohol. She reports that she does not use drugs.  REVIEW OF SYSTEMS:   Unable to obtain as pt unable to participate.  SUBJECTIVE:   VITAL SIGNS: Pulse Rate:  [97-106] 98 (12/31 1245) Resp:  [16-38] 38 (12/31 1145) BP: (76-123)/(58-88) 123/74 (12/31 1245) SpO2:  [84 %-98 %] 98 % (12/31 1215) Weight:  [46.3 kg (102 lb)] 46.3 kg (102 lb) (12/31 0941)  PHYSICAL EXAMINATION: General: Chronically ill appearing female, resting in bed, in NAD. Neuro: Awake but unable to answer any questions or follow any commands. HEENT: [place]/AT. PERRL, sclerae anicteric.  Trach in place, site clean. Cardiovascular: RRR, no M/R/G.  Lungs: Respirations even and unlabored.  Coarse bilaterally. Abdomen: BS x 4, soft, NT/ND.  Musculoskeletal: LUE contracture, right hand tremor.    Skin: Intact, warm, no rashes.   Recent Labs  Lab 01/24/17 1011  NA 129*  K 4.1  CL 92*  CO2 26  BUN 22*  CREATININE 0.40*  GLUCOSE 134*   Recent Labs  Lab 01/24/17 1011  HGB 12.0  HCT 37.0  WBC 14.9*  PLT 248   Dg Chest Port 1 View  Result Date: 01/24/2017 CLINICAL DATA:  Respiratory distress, tracheostomy patient. EXAM: PORTABLE CHEST 1 VIEW COMPARISON:  Chest x-ray of January 02, 2017 FINDINGS: The lungs are less well inflated today. There remain increased lung markings at the left lung base but the previously demonstrated confluent density has improved considerably. The heart is normal in size. The central pulmonary vascularity is mildly prominent but stable. There is no pleural effusion. The tracheostomy tube tip projects at the inferior margin of the clavicular heads. There is calcification in the wall of the aortic arch. The bony thorax exhibits no acute  abnormality. A PEG tube is visible in the left upper abdomen. IMPRESSION: Improved appearance of the left lower lobe consistent with decreased atelectasis or pneumonia. No definite acute cardiopulmonary abnormality. Electronically Signed   By: David  Martinique M.D.   On: 01/24/2017 10:03    STUDIES:  CXR 12/31 > no significant changes from prior, LLL PNA slightly improved.  SIGNIFICANT EVENTS  12/31 > admit.  MICRO: Sputum 12/31 > abundant GNR's, GPC's, GPR's >  Urine 12/31 >  Blood 12/31 >   ANTIBIOTICS: Vanc 12/31 >  Ceftaz 12/31 >   ASSESSMENT / PLAN:  Chronic respiratory failure - s/p tracheostomy (currently #4 cuffless with PMV - downsized on 12/11 prior to transfer to [facility]). Chronic mucous plugging. Mild interstitial edema. Presumed HCAP - has hx of recurrent PNA's with sputum cultures positive for MRSA on previous admissions. Plan: Continue ATC as tolerated. Goal SpO2 > 92%. Continue albuterol, deep tracheal suction, hypertonic saline and chest PT (already ordered). Lasix 20mg  x 1 and assess  response. Start empiric vanc / ceftaz. Follow cultures through completion (prelim sputum culture with abundant GNR's, GPC's, GPR's). Follow RVP. CXR intermittently.   Rest per primary team.  Ok to admit to SDU under [place].  PCCM will follow 2x per week for trach needs.    Montey Hora, [facility] Pager: [phone]  or [phone] 01/24/2017, 2:28 PM

## 2017-01-24 NOTE — ED Provider Notes (Signed)
[facility] EMERGENCY DEPARTMENT Provider Note   CSN: [id number] Arrival date & time: 01/24/17  7425     History   Chief Complaint Chief Complaint  Patient presents with  . Respiratory Distress    HPI Judy Spears is a 66 y.o. female.  66yo F w/ extensive PMH including respiratory failure s/p trach, g-tube who p/w respiratory distress. Pt was admitted to the hospital from 11/11-12/11, discharged from rehab facility 3 days ago. She has been doing well until this morning, when husband reports she woke up and seemed to be breathing harder. They noted her O2 sat to be lower (normally not on O2.)  He suctioned several times and got out a lot of secretions. They also tried breathing treatment and chest physiotherapy but couldn't get her O2 sat above ~89% so they brought her to ED. She has not had a cough, fevers, vomiting, or diarrhea. Last BM was 2 days ago. No sick contacts. She had pneumonia and flu shots this year.  LEVEL 5 CAVEAT DUE TO PATIENT NON-VERBAL   The history is provided by the spouse and a caregiver.    Past Medical History:  Diagnosis Date  . Arthritis    lt great toe  . Complication of anesthesia    pt has had headaches post op-did advise to hydra well preop  . Hair loss    right-sided  . History of exercise stress test    a. ETT (12/15) with 12:00 exercise, no ST changes, occasional PACs.   . Hyperlipidemia   . Insomnia   . Migraine headache   . Movement disorder    resltess in left legs  . Postmenopausal   . 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 Premature atrial contractions    a. Holter (12/15) with 8% PACs, no atrial runs or atrial fibrillation.     Patient Active Problem List   Diagnosis Date Noted  . Chronic mucus hypersecretion, respiratory 12/31/2016  . Severe protein-calorie malnutrition [name]: less than 60% of standard weight) ([place]) 12/31/2016  . Acute urinary retention 12/31/2016  . Constipation 12/31/2016  . Shortness  of breath   . Respiratory distress   . Pneumonia 12/05/2016  . Acute respiratory failure ([place]) 12/05/2016  . Pseudomonas urinary tract infection 10/27/2016  . Severe muscle deconditioning 09/30/2016  . Pressure injury of skin 09/28/2016  . Respiratory failure ([place])   . Tracheobronchitis   . Urinary retention   . PEG (percutaneous endoscopic gastrostomy) status ([place])   . Benign essential HTN   . Hyponatremia   . Hypokalemia   . Reactive hypertension   . Respiratory insufficiency 09/24/2016  . Malignant neoplasm of upper-inner quadrant of left breast in female, estrogen receptor positive ([place]) 07/14/2016  . Heterotopic ossification of bone 03/10/2016  . Acute on chronic respiratory failure with hypoxia ([place])   . Acute hypoxemic respiratory failure ([place])   . Acute blood loss anemia   . Atelectasis 02/04/2016  . Debility 02/03/2016  . Pneumonia of both lower lobes due to Pseudomonas species ([place])   . Pneumonia of both lower lobes due to methicillin susceptible Staphylococcus aureus (MSSA) ([place])   . Acute respiratory failure with hypoxia ([place])   . Acute encephalopathy   . Seizures ([place])   . Sepsis ([place])   . Hypoxia   . Pain   . HCAP (healthcare-associated pneumonia) 12/28/2015  . Chronic respiratory failure ([place]) 12/28/2015  . Acute tracheobronchitis 12/24/2015  . Tracheostomy tube present ([place])   . Tracheal stenosis   .  Chronic respiratory failure with hypoxia ([place])   . 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 Tracheostomy status ([place]) 09/26/2015  . Allergic rhinitis 09/26/2015  . 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 Movement disorder 09/22/2015  . Encephalitis 09/22/2015  . Chest pain 02/07/2014  . Premature atrial contractions 01/13/2014  . Abnormality of gait 12/04/2013  . Hyperlipidemia 12/21/2011  . Cardiovascular risk factor 12/21/2011  . Bunion 01/31/2008  . Metatarsalgia of both feet 09/20/2007  . FLAT FOOT 09/20/2007  . HALLUX RIGIDUS, ACQUIRED  09/20/2007    Past Surgical History:  Procedure Laterality Date  . BREAST BIOPSY  01/01/2011   Procedure: BREAST BIOPSY WITH NEEDLE LOCALIZATION;  Surgeon: Rolm Bookbinder, MD;  Location: [facility];  Service: General;  Laterality: Left;  left breast wire localization biopsy  . BREAST BIOPSY Right 07/27/2012  . BREAST BIOPSY Right 07/07/2016  . BREAST BIOPSY Left 06/30/2016  . cataracts right eye    . CESAREAN SECTION  1986  . HERNIA REPAIR  2000   [facility]  . IR [place] TUBE PERCUT W/FLUORO  09/29/2016  . opirectinal membrane peel    . PEG PLACEMENT    . RHINOPLASTY  1983  . TRACHEOSTOMY    . TRACHEOSTOMY TUBE PLACEMENT N/A 12/15/2016   Procedure: TRACHEOSTOMY CHANGE;  Surgeon: [name] Montane, MD;  Location: [facility];  Service: ENT;  Laterality: N/A;    OB History    No data available       Home Medications    Prior to Admission medications   Medication Sig Start Date End Date Taking? Authorizing Provider  acetaminophen (TYLENOL) 500 MG tablet Place 500 mg into feeding tube 3 (three) times daily. May give an additional 500mg  per g-tube three times daily as needed for mild/moderate pain   Yes [provider]  albuterol (PROAIR HFA) 108 (90 Base) MCG/ACT inhaler Inhale 2 puffs into the lungs 2 (two) times daily.   Yes [provider]  albuterol (PROVENTIL) (2.5 MG/3ML) 0.083% nebulizer solution Take 3 mLs (2.5 mg total) by nebulization every 6 (six) hours as needed for wheezing or shortness of breath. Patient taking differently: Take 2.5 mg every 6 (six) hours as needed by nebulization for wheezing or shortness of breath (congestion).  11/24/16  Yes Juanito Doom, MD  amantadine (SYMMETREL) 50 MG/5ML solution PLACE 28mL INTO FEEDING TUBE TWICE DAILY Patient taking differently: PLACE 15 ML (150MG )  INTO FEEDING TUBE EVERY 12 HOURS -  8AM AND NOON 11/25/16  Yes Meredith Staggers, MD  amLODipine (NORVASC) 10 MG tablet Place 1 tablet (10 mg  total) into feeding tube daily. 10/27/16 10/27/17 Yes Love, Ivan Anchors, PA-C  atorvastatin (LIPITOR) 20 MG tablet Place 20 mg into feeding tube at bedtime.    Yes [provider]  baclofen (LIORESAL) 10 MG tablet PLACE 1 TABLET INTO FEEDING TUBE EVERY 6HOURS Patient taking differently: PLACE 1 TABLET (10 MG) INTO FEEDING TUBE FOUR TIMES DAILY 12/02/16  Yes Meredith Staggers, MD  bethanechol (URECHOLINE) 5 MG tablet Take 1 tablet (5 mg total) by mouth daily. 01/05/17  Yes Nita Sells, MD  bromocriptine (PARLODEL) 2.5 MG tablet Take 2.5 mg by mouth daily.   Yes [provider]  Calcium Carb-Cholecalciferol (CALCIUM CARBONATE-VITAMIN D3 PO) Place 2 tablets into feeding tube daily.   Yes [provider]  carbidopa-levodopa (SINEMET IR) 25-100 MG tablet Place 2 tablets into feeding tube 3 (three) times daily.   Yes [provider]  chlorhexidine (PERIDEX) 0.12 % solution 43ml  IN THE MOUTH OR THROAT TWICE DAILY Patient taking differently: 15ML INTO THE MOUTH/THROAT TWICE DAILY 12/02/16  Yes Meredith Staggers, MD  Cholecalciferol (VITAMIN D) 2000 units CAPS Place 2,000 Units daily into feeding tube.   Yes [provider]  Coenzyme Q10 (COQ10 PO) Place 10 mLs into feeding tube daily. 0600   Yes [provider]  fluticasone (FLONASE) 50 MCG/ACT nasal spray Place 1 spray into both nostrils daily.    Yes [provider]  gabapentin (NEURONTIN) 100 MG capsule One capsule--open and administer via PEG twice a day. Patient taking differently: 100 mg See admin instructions. Open one capsule (100 mg) and administer per tube twice daily - 8 am and 5pm 10/27/16  Yes Love, Ivan Anchors, PA-C  guaiFENesin (ROBITUSSIN) 100 MG/5ML SOLN Place 10 mLs (200 mg total) into feeding tube 4 (four) times daily. Patient taking differently: Place 10 mLs into feeding tube daily as needed for cough.  02/25/16  Yes Love, Ivan Anchors, PA-C  ibuprofen (ADVIL,MOTRIN) 100 MG/5ML  suspension Place 400 mg into feeding tube every 6 (six) hours as needed for mild pain or moderate pain.   Yes [provider]  ketoconazole (NIZORAL) 2 % cream Apply 1 application topically 2 (two) times daily.   Yes [provider]  loratadine (CLARITIN) 10 MG tablet Place 1 tablet (10 mg total) into feeding tube daily as needed for allergies. Patient taking differently: Place 10 mg into feeding tube daily.  02/25/16  Yes Love, Ivan Anchors, PA-C  Melatonin 5 MG CAPS Place 5 mg at bedtime into feeding tube.    Yes [provider]  Multiple Vitamin (MULTIVITAMIN) LIQD Place 15 mLs into feeding tube daily. Patient taking differently: Place 15 mLs into feeding tube daily. 0600 05/11/16  Yes Ollis, Brandi L, NP  Nutritional Supplements (FEEDING SUPPLEMENT, OSMOLITE 1.5 CAL,) LIQD Place 1,000 mLs into feeding tube daily. Patient taking differently: Place into feeding tube See admin instructions. Give nightly continuous feed for 13 hours at 172ml/hour. Give two bolus feeds of 269ml in between sinemet doses. 09/30/16  Yes Patrecia Pour, Christean Grief, MD  pantoprazole sodium (PROTONIX) 40 mg/20 mL PACK Place 40 mg into feeding tube daily.   Yes [provider]  potassium chloride 20 MEQ/15ML (10%) SOLN Place 15 mLs (20 mEq total) into feeding tube daily. 05/05/16  Yes Meredith Staggers, MD  primidone (MYSOLINE) 50 MG tablet Place 1 tablet (50 mg total) into feeding tube 4 (four) times daily. 10/27/16  Yes Love, Ivan Anchors, PA-C  Propylene Glycol (SYSTANE BALANCE) 0.6 % SOLN Place 1 drop into both eyes daily.   Yes [provider]  saccharomyces boulardii (FLORASTOR) 250 MG capsule Place 250 mg into feeding tube 2 (two) times daily.   Yes [provider]  valACYclovir (VALTREX) 500 MG tablet Place 500 mg daily into feeding tube.    Yes [provider]  vitamin C (ASCORBIC ACID) 500 MG tablet Place 500 mg daily into feeding tube.   Yes [provider]  Water  For Irrigation, Sterile (FREE WATER) SOLN Place 200 mLs into feeding tube every 8 (eight) hours. Patient taking differently: Place 200 mLs 3 (three) times daily into feeding tube.  10/27/16  Yes Love, Ivan Anchors, PA-C  Water For Irrigation, Sterile (FREE WATER) SOLN Place 100 mLs into feeding tube every 8 (eight) hours. 01/04/17  Yes Nita Sells, MD  Amino Acids-Protein Hydrolys (FEEDING SUPPLEMENT, PRO-STAT SUGAR FREE 64,) LIQD Place 30 mLs into feeding tube 2 (two)  times daily. Patient not taking: Reported on 01/24/2017 10/27/16   Love, Ivan Anchors, PA-C  bethanechol (URECHOLINE) 5 MG tablet 5 mg BID x 1 week, then 5mg  QD x 1 week and then stop Patient not taking: Reported on 01/24/2017 12/31/16   Donita Brooks, NP  clotrimazole (LOTRIMIN) 1 % cream Apply topically 2 (two) times daily. Patient not taking: Reported on 01/24/2017 10/27/16   Love, Ivan Anchors, PA-C  sodium chloride HYPERTONIC 3 % nebulizer solution Take by nebulization 2 (two) times daily as needed for other. Patient not taking: Reported on 01/24/2017 11/24/16   Juanito Doom, MD    Family History Family History  Problem Relation Age of Onset  . Stroke Mother        paralyzed on right side/aphasia  . Hypertension Mother        had stroke after stopping BP meds  . Colon cancer Father   . Prostate cancer Father   . Heart attack Maternal Grandfather 56  . Stroke Maternal Uncle 53    Social History Social History   Tobacco Use  . Smoking status: Never Smoker  . Smokeless tobacco: Never Used  Substance Use Topics  . Alcohol use: Yes    Alcohol/week: 0.0 oz    Comment: Occasional.  . Drug use: No     Allergies   Cefepime; Tetracyclines & related; Doxycycline monohydrate; and Pollen extract   Review of Systems Review of Systems  Unable to perform ROS: Patient nonverbal     Physical Exam Updated Vital Signs BP 127/73   Pulse 98   Temp 98.2 F (36.8 C) (Oral)   Resp (!) 33   Ht 5\' 2"  (1.575 m)   Wt  46.3 kg (102 lb)   LMP 12/01/2010   SpO2 95%   BMI 18.66 kg/m   Physical Exam  Constitutional: She appears well-developed. No distress.  Thin, chronically ill appearing  HENT:  Head: Normocephalic and atraumatic.  Moist mucous membranes  Eyes: Conjunctivae are normal. Pupils are equal, round, and reactive to light.  Neck: Neck supple.  Cardiovascular: Normal rate and regular rhythm.  Murmur heard. Pulmonary/Chest:  Mildly increased WOB with mild tachypnea. Coarse rhonchi b/l  Abdominal: Soft. Bowel sounds are normal. She exhibits distension (mild). There is no tenderness.  g-tube in place with no surrounding drainage  Musculoskeletal: She exhibits no edema.  Neurological: She is alert.  Awake, non-verbal; muscle atrophy of extremities  Skin: Skin is warm and dry.  Old ecchymoses scattered on abdomen  Nursing note and vitals reviewed.    ED Treatments / Results  Labs (all labs ordered are listed, but only abnormal results are displayed) Labs Reviewed  COMPREHENSIVE METABOLIC PANEL - Abnormal; Notable for the following components:      Result Value   Sodium 129 (*)    Chloride 92 (*)    Glucose, Bld 134 (*)    BUN 22 (*)    Creatinine, Ser 0.40 (*)    Total Protein 6.2 (*)    Albumin 3.1 (*)    AST 53 (*)    All other components within normal limits  CBC WITH DIFFERENTIAL/PLATELET - Abnormal; Notable for the following components:   WBC 14.9 (*)    RDW 15.9 (*)    Neutro Abs 12.7 (*)    Monocytes Absolute 1.3 (*)    All other components within normal limits  URINALYSIS, ROUTINE W REFLEX MICROSCOPIC - Abnormal; Notable for the following components:   APPearance HAZY (*)  Glucose, UA 50 (*)    Protein, ur 30 (*)    Squamous Epithelial / LPF 0-5 (*)    All other components within normal limits  I-STAT CG4 LACTIC ACID, ED - Abnormal; Notable for the following components:   Lactic Acid, Venous 2.09 (*)    All other components within normal limits  I-STAT VENOUS  BLOOD GAS, ED - Abnormal; Notable for the following components:   pH, Ven 7.529 (*)    pCO2, Ven 34.7 (*)    pO2, Ven 47.0 (*)    Bicarbonate 28.9 (*)    Acid-Base Excess 6.0 (*)    All other components within normal limits  I-STAT CG4 LACTIC ACID, ED - Abnormal; Notable for the following components:   Lactic Acid, Venous 2.32 (*)    All other components within normal limits  CULTURE, RESPIRATORY (NON-EXPECTORATED)  CULTURE, BLOOD (ROUTINE X 2)  CULTURE, BLOOD (ROUTINE X 2)  URINE CULTURE  RESPIRATORY PANEL BY PCR  PROTIME-INR    EKG  EKG Interpretation  Date/Time:  Monday January 24 2017 09:11:11 EST Ventricular Rate:  106 PR Interval:  98 QRS Duration: 76 QT Interval:  338 QTC Calculation: 448 R Axis:   33 Text Interpretation:  Sinus tachycardia with short PR Possible Left atrial enlargement Borderline ECG No significant change since last tracing Confirmed by Merrily Pew [phone]) on 01/24/2017 9:18:18 AM       Radiology Dg Chest Port 1 View  Result Date: 01/24/2017 CLINICAL DATA:  Respiratory distress, tracheostomy patient. EXAM: PORTABLE CHEST 1 VIEW COMPARISON:  Chest x-ray of January 02, 2017 FINDINGS: The lungs are less well inflated today. There remain increased lung markings at the left lung base but the previously demonstrated confluent density has improved considerably. The heart is normal in size. The central pulmonary vascularity is mildly prominent but stable. There is no pleural effusion. The tracheostomy tube tip projects at the inferior margin of the clavicular heads. There is calcification in the wall of the aortic arch. The bony thorax exhibits no acute abnormality. A PEG tube is visible in the left upper abdomen. IMPRESSION: Improved appearance of the left lower lobe consistent with decreased atelectasis or pneumonia. No definite acute cardiopulmonary abnormality. Electronically Signed   By: David  Martinique M.D.   On: 01/24/2017 10:03     Procedures Procedures (including critical care time)  Medications Ordered in ED Medications  vancomycin (VANCOCIN) IVPB 750 mg/150 ml premix (not administered)  primidone (MYSOLINE) tablet 50 mg (50 mg Per Tube Given 01/24/17 1353)  baclofen (LIORESAL) tablet 5 mg (5 mg Per Tube Given 01/24/17 1354)  carbidopa-levodopa (SINEMET IR) 25-100 MG per tablet immediate release 2 tablet (2 tablets Per Tube Given 01/24/17 1353)  acetaminophen (TYLENOL) tablet 500 mg (500 mg Per Tube Given 01/24/17 1352)  albuterol (PROVENTIL) (2.5 MG/3ML) 0.083% nebulizer solution 2.5 mg (not administered)  sodium chloride HYPERTONIC 3 % nebulizer solution 4 mL (not administered)  cefTAZidime (FORTAZ) 1 g in dextrose 5 % 50 mL IVPB (1 g Intravenous New Bag/Given 01/24/17 1441)  sodium chloride 0.9 % bolus 1,000 mL (0 mLs Intravenous Stopped 01/24/17 1410)  aztreonam (AZACTAM) 2 g in dextrose 5 % 50 mL IVPB (0 g Intravenous Stopped 01/24/17 1249)  vancomycin (VANCOCIN) IVPB 1000 mg/200 mL premix (0 mg Intravenous Stopped 01/24/17 1354)  furosemide (LASIX) injection 20 mg (20 mg Intravenous Given 01/24/17 1445)     Initial Impression / Assessment and Plan / ED Course  I have reviewed the triage vital signs and  the nursing notes.  Pertinent labs & imaging results that were available during my care of the patient were reviewed by me and considered in my medical decision making (see chart for details).     Pt w/ chronic respiratory failure and trach dependence, just d/c'd from rehab, w/ increased respiratory difficulty and thick secretions this morning.  She was afebrile on arrival, blood pressure at triage was initially low but multiple rechecks immediately afterwards were normal without any intervention therefore I suspect it was a spurious value.  She was maintaining normal O2 saturation with FiO2 35% on trach collar. Thick dark secretions from trach.  Labs including blood and urine cultures, sputum culture  sent.  Initial lactate 2.09, sodium 129, WBC 14.9, CXR actually mildly improved from previous but based on sx and labs I am concerned about bacterial respiratory infection.  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 Gave Vanc and aztreonam. Discussed w/ CCM/Pulm, Dr. Pearline Cables and Dr. Lake Bells, and Dr. Lake Bells has evaluated patient, agrees with treatment plan.  Discussed admission with hospitalist, Dr. Denton Brick, and pt admitted for further care.  Final Clinical Impressions(s) / ED Diagnoses   Final diagnoses:  Acute respiratory failure, unspecified whether with hypoxia or hypercapnia [facility])  Respiratory infection    ED Discharge Orders    None       Julieta Rogalski, Wenda Overland, MD 01/24/17 [phone]

## 2017-01-24 NOTE — ED Notes (Signed)
Report attempted 

## 2017-01-24 NOTE — ED Triage Notes (Signed)
Pt presents for evaluation of difficulty breathing. Family reports given breathing treatments and trach suctioning at home and believe possible mucus plug to trach. States O2 saturation 82% at home. Pt is 91% on RA in ED.

## 2017-01-24 NOTE — Progress Notes (Addendum)
Pharmacy Antibiotic Note  Judy Spears is a 66 y.o. female admitted on 01/24/2017 with pneumonia.  Pharmacy has been consulted for vancomycin and ceftazidime dosing. Pt is afebrile with elevated WBC and lactic acid. Scr is low at 0.4.   Plan: Vancomycin 1gm IV x 1 then 750mg  IV Q8H (previously therapeutic on this dose) Ceftazidime 1gm IV Q8H - has tolerated this in the past F/u renal fxn, C&S, clinical status and trough at SS   Height: 5\' 2"  (157.5 cm) Weight: 102 lb (46.3 kg) IBW/kg (Calculated) : 50.1  No data recorded.  Recent Labs  Lab 01/24/17 1011 01/24/17 1025  WBC 14.9*  --   CREATININE 0.40*  --   LATICACIDVEN  --  2.09*    Estimated Creatinine Clearance: 51.2 mL/min (A) (by C-G formula based on SCr of 0.4 mg/dL (L)).    Allergies  Allergen Reactions  . Cefepime Other (See Comments)    Status epilepticus- this reaction is questionable per husband.States that it was determined that pt did not have seizure activity. Status epilepticus  . Tetracyclines & Related Photosensitivity and Other (See Comments)    Blisters (childhood allergy)  . Doxycycline Monohydrate Other (See Comments)    Blisters, photosensitivity- family state that the reaction is actually to tetracycline.  . Pollen Extract Other (See Comments)    Running nose (seasonal allergies)    Antimicrobials this admission: Vanc 12/31>> Ceftaz 12/31>> Aztreo x 1 12/31  Dose adjustments this admission: N/A  Microbiology results: Pending  Thank you for allowing pharmacy to be a part of this patient's care.  Judy Spears, Judy Spears 01/24/2017 11:36 AM

## 2017-01-24 NOTE — ED Notes (Signed)
Gabriel Cirri, RN to transport patient to [facility]

## 2017-01-25 ENCOUNTER — Other Ambulatory Visit: Payer: Self-pay

## 2017-01-25 ENCOUNTER — Inpatient Hospital Stay (HOSPITAL_COMMUNITY): Payer: Medicare Other

## 2017-01-25 DIAGNOSIS — J9600 Acute respiratory failure, unspecified whether with hypoxia or hypercapnia: Secondary | ICD-10-CM

## 2017-01-25 DIAGNOSIS — J9621 Acute and chronic respiratory failure with hypoxia: Secondary | ICD-10-CM

## 2017-01-25 LAB — CBC
HCT: 35.4 % — ABNORMAL LOW (ref 36.0–46.0)
HEMOGLOBIN: 11.7 g/dL — AB (ref 12.0–15.0)
MCH: 29.8 pg (ref 26.0–34.0)
MCHC: 33.1 g/dL (ref 30.0–36.0)
MCV: 90.3 fL (ref 78.0–100.0)
Platelets: 214 10*3/uL (ref 150–400)
RBC: 3.92 MIL/uL (ref 3.87–5.11)
RDW: 15.8 % — ABNORMAL HIGH (ref 11.5–15.5)
WBC: 10.7 10*3/uL — ABNORMAL HIGH (ref 4.0–10.5)

## 2017-01-25 LAB — BASIC METABOLIC PANEL
ANION GAP: 14 (ref 5–15)
BUN: 15 mg/dL (ref 6–20)
CO2: 27 mmol/L (ref 22–32)
Calcium: 8.5 mg/dL — ABNORMAL LOW (ref 8.9–10.3)
Chloride: 90 mmol/L — ABNORMAL LOW (ref 101–111)
Creatinine, Ser: <0.3 mg/dL — ABNORMAL LOW (ref 0.44–1.00)
GLUCOSE: 129 mg/dL — AB (ref 65–99)
POTASSIUM: 3.3 mmol/L — AB (ref 3.5–5.1)
Sodium: 131 mmol/L — ABNORMAL LOW (ref 135–145)

## 2017-01-25 LAB — BLOOD CULTURE ID PANEL (REFLEXED)
Acinetobacter baumannii: NOT DETECTED
CANDIDA ALBICANS: NOT DETECTED
CANDIDA PARAPSILOSIS: NOT DETECTED
CANDIDA TROPICALIS: NOT DETECTED
Candida glabrata: NOT DETECTED
Candida krusei: NOT DETECTED
ENTEROBACTERIACEAE SPECIES: NOT DETECTED
Enterobacter cloacae complex: NOT DETECTED
Enterococcus species: NOT DETECTED
Escherichia coli: NOT DETECTED
HAEMOPHILUS INFLUENZAE: NOT DETECTED
KLEBSIELLA PNEUMONIAE: NOT DETECTED
Klebsiella oxytoca: NOT DETECTED
Listeria monocytogenes: NOT DETECTED
METHICILLIN RESISTANCE: DETECTED — AB
NEISSERIA MENINGITIDIS: NOT DETECTED
PROTEUS SPECIES: NOT DETECTED
Pseudomonas aeruginosa: NOT DETECTED
SERRATIA MARCESCENS: NOT DETECTED
STAPHYLOCOCCUS AUREUS BCID: NOT DETECTED
STAPHYLOCOCCUS SPECIES: DETECTED — AB
STREPTOCOCCUS SPECIES: NOT DETECTED
Streptococcus agalactiae: NOT DETECTED
Streptococcus pneumoniae: NOT DETECTED
Streptococcus pyogenes: NOT DETECTED

## 2017-01-25 LAB — URINE CULTURE: SPECIAL REQUESTS: NORMAL

## 2017-01-25 LAB — LACTIC ACID, PLASMA: LACTIC ACID, VENOUS: 1.3 mmol/L (ref 0.5–1.9)

## 2017-01-25 LAB — VANCOMYCIN, TROUGH: VANCOMYCIN TR: 10 ug/mL — AB (ref 15–20)

## 2017-01-25 MED ORDER — HYDRALAZINE HCL 20 MG/ML IJ SOLN: 10.0000 mg | Freq: Four times a day (QID) | INTRAMUSCULAR | Status: DC | PRN

## 2017-01-25 MED ORDER — OSMOLITE 1.5 CAL PO LIQD
1560.0000 mL | Freq: Every day | ORAL | Status: DC
  Administered 2017-01-25: 1000 mL
  Administered 2017-01-25 – 2017-01-26 (×2): 1560 mL
  Administered 2017-01-27: 1000 mL
  Administered 2017-01-27 – 2017-02-01 (×5): 1560 mL
  Filled 2017-01-25 (×12): qty 2000

## 2017-01-25 MED ORDER — VANCOMYCIN HCL IN DEXTROSE 1-5 GM/200ML-% IV SOLN
1000.0000 mg | Freq: Three times a day (TID) | INTRAVENOUS | Status: DC
  Administered 2017-01-26 – 2017-01-28 (×8): 1000 mg via INTRAVENOUS
  Filled 2017-01-25 (×9): qty 200

## 2017-01-25 NOTE — Progress Notes (Signed)
Pharmacy Antibiotic Note  Judy Spears is a 67 y.o. female admitted on 01/24/2017 with pneumonia and bacteremia.  Pharmacy has been consulted for Vancomycin dosing. WBC decreasing. Renal function good.   Vancomycin trough tonight is sub-therapeutic at 10  Plan: Increase vancomycin to 1000 mg IV q8h Re-check vancomycin trough at steady state  Height: 5\' 2"  (157.5 cm) Weight: 102 lb (46.3 kg) IBW/kg (Calculated) : 50.1  Temp (24hrs), Avg:97.9 F (36.6 C), Min:96.8 F (36 C), Max:98.7 F (37.1 C)  Recent Labs  Lab 01/24/17 1011 01/24/17 1025 01/24/17 1159 01/25/17 0147 01/25/17 1233 01/25/17 2130  WBC 14.9*  --   --  10.7*  --   --   CREATININE 0.40*  --   --  <0.30*  --   --   LATICACIDVEN  --  2.09* 2.32*  --  1.3  --   VANCOTROUGH  --   --   --   --   --  10*    CrCl cannot be calculated (This lab value cannot be used to calculate CrCl because it is not a number: <0.30).    Allergies  Allergen Reactions  . Cefepime Other (See Comments)    Status epilepticus- this reaction is questionable per husband.States that it was determined that pt did not have seizure activity. Status epilepticus  . Tetracyclines & Related Photosensitivity and Other (See Comments)    Blisters (childhood allergy)  . Doxycycline Monohydrate Other (See Comments)    Blisters, photosensitivity- family state that the reaction is actually to tetracycline.  . Pollen Extract Other (See Comments)    Running nose (seasonal allergies)     Narda Bonds 01/25/2017 11:16 PM

## 2017-01-25 NOTE — Progress Notes (Signed)
PHARMACY - PHYSICIAN COMMUNICATION CRITICAL VALUE ALERT - BLOOD CULTURE IDENTIFICATION (BCID)  Judy Spears is an 67 y.o. female who presented to [facility] on [facility]/24/2017 with a chief complaint of respiratory failure likely secondary to pneumonia  Assessment:  Likely pneumonia with history of prior MRSA and pseudomonas respiratory infections  Name of physician (or Provider) Contacted: Dr. Allyson Sabal  Current antibiotics: Vancomycin and Ceftazidime for pneumonia   Changes to prescribed antibiotics recommended:  Patient is on recommended antibiotics - No changes needed  No results found. However, due to the size of the patient record, not all encounters were searched. Please check Results Review for a complete set of results.  Albertina Parr, PharmD., BCPS Clinical Pharmacist Pager [phone]

## 2017-01-25 NOTE — Progress Notes (Addendum)
[facility] PROGRESS NOTE  Judy Spears GUR:[id number] DOB: 1950/07/28 DOA: 01/24/2017   PCP: Marton Redwood, MD     Assessment/Plan: Principal Problem:   Acute tracheobronchitis Active Problems:   Encephalitis, arthropod-borne viral   Tracheostomy status ([facility])   Spastic tetraplegia (HCC)   Chronic respiratory failure (HCC)   Tracheobronchitis   PEG (percutaneous endoscopic gastrostomy) status (HCC)   Benign essential HTN   Hyponatremia   Hypokalemia   Chronic mucus hypersecretion, respiratory   67 y.o. 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  Patient was recently admitted 12/05/16 through 01/04/17 for acute on chronic respiratory failure, Following that hospitalization, she was transferred to [place] for in hospital rehab. Per family, she was just discharged from there on 01/21/17 and since discharge, had been doing well until morning of 01/24/17 when she had dyspnea and hypoxia (SpO2 in 80s). Family attempted chest PT manuevers (which they were coached on during previous admissions), but symptoms did not improve, therefore, she was brought to ED for further eval.  Family felt that she might have had mucous plugging at home. In ED, RT was able to suction several plugs out after saline lavage.  Patient is seen at bedside in conjunction with Dr. Lake Bells from PCCM who is very familiar with patient.  The patient has a history of MRSA and Pseudomonas respiratory infections, Dr. Lake Bells advises coverage for both organisms at this time  Assessment and plan 1)Acute on chronic Hypoxic Respiratory Failure- patient presented with hypoxia, patient has history of recurrent mucous plugging,   given altered mentation, leukocytosis    and hypoxia there is suspicion of HCAP, given history of prior MRSA and Pseudomonas respiratory infection Dr. Lake Bells advises vancomycin and cefepime at this time pending sputum and blood cultures. PCCM consult appreciated, they will  continue to follow, pulmonary toilet, chest PT, bronchodilators and mucolytics, respiratory panel including influenza is negative, however Gram stain noted with organisms polymorphs.   follow results of sputum culture. Hopefully narrow antibiotics over the next 24-48 hours. Continue hypertonic saline and chest physiotherapy. Continue stepdown unit, respiratory therapy   to help with trach management. Continue aspiration precautions. Patient also found to have moderate to severe dysphagia, previously recommended to have Puree/Nectar thick liquids, per family friend she only has TF , does not take anything by mouth at baseline .   2)FEN-resume tube feed, hyponatremia noted, nutritional consult requested  3)Spastic Tetraplegia-continue baclofen, Sinemet, gabapentin and bromocriptine  4)Social/Ethics-plan of care and CODE STATUS discussed with patient's husband at bedside, she is a full code. He  5) hypertension-will order as needed hydralazine for systolic greater than 283 Continue Norvasc  6) dyslipidemia-continue statin  DVT prophylaxsis  heparin  Code Status:  Full code     Family Communication: Discussed in detail with the patient, all imaging results, lab results explained to the patient   Disposition Plan:   Continue telemetry stable for discharge      Consultants:   Critical care   ProcedNone Antibiotics: Anti-infectives (From admission, onward)   Start     Dose/Rate Route Frequency Ordered Stop   01/25/17 1000  valACYclovir (VALTREX) tablet 500 mg     500 mg Per Tube Daily 01/24/17 1611     01/24/17 2200  vancomycin (  VANCOCIN) IVPB 750 mg/150 ml premix     750 mg 150 mL/hr over 60 Minutes Intravenous Every 8 hours 01/24/17 1135      01/24/17 1500  cefTAZidime (FORTAZ) 1 g in dextrose 5 % 50 mL IVPB     1 g 100 mL/hr over 30 Minutes Intravenous Every 8 hours 01/24/17 1423     01/24/17 1145  vancomycin (VANCOCIN) IVPB 1000 mg/200 mL premix     1,000 mg 200 mL/hr over 60 Minutes Intravenous  Once 01/24/17 1131 01/24/17 1354   01/24/17 1130  aztreonam (AZACTAM) 2 g in dextrose 5 % 50 mL IVPB     2 g 100 mL/hr over 30 Minutes Intravenous  Once 01/24/17 1124 01/24/17 1249         HPI/Subjective:  Slight cough, still requiring  35% FiO2   Objective: Vitals:   01/25/17 0402 01/25/17 0742 01/25/17 1025 01/25/17 1135  BP: (!) 219/182 (!) 145/89  107/63  Pulse: 100 (!) 112  92  Resp: (!) 34 (!) 36  (!) 29  Temp: (!) 97 F (36.1 C) 98.6 F (37 C)  98.3 F (36.8 C)  TempSrc: Axillary Axillary  Oral  SpO2: 93% 90% 96% 97%  Weight:      Height:        Intake/Output Summary (Last 24 hours) at 01/25/2017 1207 Last data filed at 01/25/2017 1139 Gross per 24 hour  Intake 2787 ml  Output 350 ml  Net 2437 ml    Exam:  Examination:  General exam: Appears calm and comfortable  Respiratory system: Clear to auscultation. Respiratory effort normal. tracheostomy in place  Cardiovascular system: S1 & S2 heard, RRR. No JVD, murmurs, rubs, gallops or clicks. No pedal edema. Gastrointestinal system: Abdomen is nondistended, soft and nontender. No organomegaly or masses felt. Normal bowel sounds heard. Central nervous system: Alert and oriented. No focal neurological deficits. Extremities: Symmetric 5 x 5 power. Skin: No rashes, lesions or ulcers Psychiatry: Judgement and insight appear normal. Mood & affect appropriate.     Data Reviewed: I have personally reviewed following labs and imaging studies  Micro Results Recent Results (from the past 240 hour(s))  Culture, respiratory (NON-Expectorated)     Status: None (Preliminary result)   Collection Time: 01/24/17 10:19 AM  Result Value Ref Range Status   Specimen  Description TRACHEAL ASPIRATE  Final   Special Requests NONE  Final   Gram Stain   Final    MODERATE WBC PRESENT, PREDOMINANTLY PMN ABUNDANT GRAM NEGATIVE RODS ABUNDANT GRAM POSITIVE COCCI ABUNDANT GRAM POSITIVE RODS    Culture CULTURE REINCUBATED FOR BETTER GROWTH  Final   Report Status PENDING  Incomplete  Respiratory Panel by PCR     Status: None   Collection Time: 01/24/17 11:40 AM  Result Value Ref Range Status   Adenovirus NOT DETECTED NOT DETECTED Final   Coronavirus 229E NOT DETECTED NOT DETECTED Final   Coronavirus HKU1 NOT DETECTED NOT DETECTED Final   Coronavirus NL63 NOT DETECTED NOT DETECTED Final   Coronavirus OC43 NOT DETECTED NOT DETECTED Final   Metapneumovirus NOT DETECTED NOT DETECTED Final   Rhinovirus / Enterovirus NOT DETECTED NOT DETECTED Final   Influenza A NOT DETECTED NOT DETECTED Final   Influenza B NOT DETECTED NOT DETECTED Final   Parainfluenza Virus 1 NOT DETECTED NOT DETECTED Final   Parainfluenza Virus 2 NOT DETECTED NOT DETECTED Final   Parainfluenza Virus 3 NOT DETECTED NOT DETECTED Final   Parainfluenza Virus 4 NOT DETECTED NOT DETECTED Final  Respiratory Syncytial Virus NOT DETECTED NOT DETECTED Final   Bordetella pertussis NOT DETECTED NOT DETECTED Final   Chlamydophila pneumoniae NOT DETECTED NOT DETECTED Final   Mycoplasma pneumoniae NOT DETECTED NOT DETECTED Final  Urine culture     Status: Abnormal   Collection Time: 01/24/17  1:01 PM  Result Value Ref Range Status   Specimen Description URINE, CLEAN CATCH  Final   Special Requests Normal  Final   Culture MULTIPLE SPECIES PRESENT, SUGGEST RECOLLECTION (A)  Final   Report Status 01/25/2017 FINAL  Final    Radiology Reports Dg Chest Port 1 View  Result Date: 01/24/2017 CLINICAL DATA:  Respiratory distress, tracheostomy patient. EXAM: PORTABLE CHEST 1 VIEW COMPARISON:  Chest x-ray of January 02, 2017 FINDINGS: The lungs are less well inflated today. There remain increased lung markings  at the left lung base but the previously demonstrated confluent density has improved considerably. The heart is normal in size. The central pulmonary vascularity is mildly prominent but stable. There is no pleural effusion. The tracheostomy tube tip projects at the inferior margin of the clavicular heads. There is calcification in the wall of the aortic arch. The bony thorax exhibits no acute abnormality. A PEG tube is visible in the left upper abdomen. IMPRESSION: Improved appearance of the left lower lobe consistent with decreased atelectasis or pneumonia. No definite acute cardiopulmonary abnormality. Electronically Signed   By: David  Martinique M.D.   On: 01/24/2017 10:03   Dg Chest Port 1 View  Result Date: 01/02/2017 CLINICAL DATA:  67 year old female with pneumonia. EXAM: PORTABLE CHEST 1 VIEW COMPARISON:  01/01/2017 FINDINGS: Tracheostomy tube in place. Postsurgical changes are noted at the left mid lung. Cardiomediastinal silhouette unchanged in size and morphology. Right basilar airspace opacities stable to slightly decreased from prior examination. Stable appearance of the left lung base with atelectasis and possible small effusion. IMPRESSION: 1. Stable to slightly improved right lower lobe pneumonia. 2. Stable left basilar atelectasis. Question small associated effusion. Superimposed consolidation difficult to exclude in this setting. Electronically Signed   By: Kristopher Oppenheim M.D.   On: 01/02/2017 07:53   Dg Chest Port 1 View  Result Date: 01/01/2017 CLINICAL DATA:  Pneumonia EXAM: PORTABLE CHEST 1 VIEW COMPARISON:  12/31/2016 FINDINGS: Tracheostomy tube in good position. Normal cardiac silhouette. Faint airspace disease in the RIGHT lower lobe is slightly increased in prominence compared to prior. Mild LEFT basilar atelectasis with tenting of the diaphragms unchanged. IMPRESSION: Subtle RIGHT lower lobe pneumonia. Stable LEFT basilar atelectasis. Electronically Signed   By: Suzy Bouchard M.D.    On: 01/01/2017 10:04   Dg Chest Port 1 View  Result Date: 12/31/2016 CLINICAL DATA:  Tracheostomy. EXAM: PORTABLE CHEST 1 VIEW COMPARISON:  Radiograph December 25, 2016. FINDINGS: Stable cardiomediastinal silhouette. Tracheostomy tube is in grossly good position. No pneumothorax is noted. Mild right basilar subsegmental atelectasis is noted. There appears to be increased atelectasis or infiltrate of left lower lobe with possible mild left pleural effusion. Bony thorax is unremarkable. IMPRESSION: Stable tracheostomy tube. Mild right basilar subsegmental atelectasis. Increased left lower lobe atelectasis or infiltrate is noted with possible mild left pleural effusion. Endobronchial occlusion cannot be excluded. CT or bronchoscopy may be performed for further evaluation. Electronically Signed   By: Marijo Conception, M.D.   On: 12/31/2016 08:38     CBC Recent Labs  Lab 01/24/17 1011 01/25/17 0147  WBC 14.9* 10.7*  HGB 12.0 11.7*  HCT 37.0 35.4*  PLT 248 214  MCV 90.7 90.3  MCH 29.4 29.8  MCHC 32.4 33.1  RDW 15.9* 15.8*  LYMPHSABS 0.9  --   MONOABS 1.3*  --   EOSABS 0.0  --   BASOSABS 0.0  --     Chemistries  Recent Labs  Lab 01/24/17 1011 01/25/17 0147  NA 129* 131*  K 4.1 3.3*  CL 92* 90*  CO2 26 27  GLUCOSE 134* 129*  BUN 22* 15  CREATININE 0.40* <0.30*  CALCIUM 8.9 8.5*  AST 53*  --   ALT 25  --   ALKPHOS 113  --   BILITOT 0.9  --    ------------------------------------------------------------------------------------------------------------------ CrCl cannot be calculated (This lab value cannot be used to calculate CrCl because it is not a number: <0.30). ------------------------------------------------------------------------------------------------------------------ No results for input(s): HGBA1C in the last 72 hours. ------------------------------------------------------------------------------------------------------------------ No results for input(s): CHOL, HDL,  LDLCALC, TRIG, CHOLHDL, LDLDIRECT in the last 72 hours. ------------------------------------------------------------------------------------------------------------------ No results for input(s): TSH, T4TOTAL, T3FREE, THYROIDAB in the last 72 hours.  Invalid input(s): FREET3 ------------------------------------------------------------------------------------------------------------------ No results for input(s): VITAMINB12, FOLATE, FERRITIN, TIBC, IRON, RETICCTPCT in the last 72 hours.  Coagulation profile Recent Labs  Lab 01/24/17 1011  INR 1.08    No results for input(s): DDIMER in the last 72 hours.  Cardiac Enzymes No results for input(s): CKMB, TROPONINI, MYOGLOBIN in the last 168 hours.  Invalid input(s): CK ------------------------------------------------------------------------------------------------------------------ Invalid input(s): POCBNP   CBG: No results for input(s): GLUCAP in the last 168 hours.     Studies: Dg Chest Port 1 View  Result Date: 01/24/2017 CLINICAL DATA:  Respiratory distress, tracheostomy patient. EXAM: PORTABLE CHEST 1 VIEW COMPARISON:  Chest x-ray of January 02, 2017 FINDINGS: The lungs are less well inflated today. There remain increased lung markings at the left lung base but the previously demonstrated confluent density has improved considerably. The heart is normal in size. The central pulmonary vascularity is mildly prominent but stable. There is no pleural effusion. The tracheostomy tube tip projects at the inferior margin of the clavicular heads. There is calcification in the wall of the aortic arch. The bony thorax exhibits no acute abnormality. A PEG tube is visible in the left upper abdomen. IMPRESSION: Improved appearance of the left lower lobe consistent with decreased atelectasis or pneumonia. No definite acute cardiopulmonary abnormality. Electronically Signed   By: David  Martinique M.D.   On: 01/24/2017 10:03      Lab Results   Component Value Date   HGBA1C 6.0 (H) 01/02/2016   Lab Results  Component Value Date   LDLCALC 89 03/29/2014   CREATININE <0.30 (L) 01/25/2017       Scheduled Meds: . acetaminophen  500 mg Per Tube TID  . albuterol  2.5 mg Inhalation BID  . amantadine  150 mg Per Tube BID  . amLODipine  10 mg Per Tube Daily  . atorvastatin  20 mg Per Tube QHS  . baclofen  5 mg Per Tube QID  . bethanechol  5 mg Per Tube Daily  . bromocriptine  5 mg Oral Daily  . calcium-vitamin D  1 tablet Per Tube Q breakfast  . carbidopa-levodopa  2 tablet Per Tube TID BM  . chlorhexidine  15 mL Mouth/Throat BID  . cholecalciferol  2,000 Units Oral Daily  . feeding supplement (OSMOLITE 1.5 CAL)  1,560 mL Per Tube Daily  . feeding supplement (OSMOLITE 1.5 CAL)  237 mL Per Tube BID  . fluticasone  1 spray Each Nare Daily  . free water  200 mL Per Tube Q6H  .  gabapentin  100 mg Per Tube BID  . guaiFENesin  10 mL Per Tube QID  . heparin  5,000 Units Subcutaneous Q8H  . ketoconazole  1 application Topical BID  . loratadine  10 mg Per Tube Daily  . Melatonin  6 mg Per Tube QHS  . multivitamin  15 mL Per Tube Q0600  . pantoprazole sodium  40 mg Per Tube Daily  . polyvinyl alcohol  1 drop Both Eyes Daily  . potassium chloride  20 mEq Per Tube Daily  . primidone  50 mg Per Tube QID  . saccharomyces boulardii  250 mg Per Tube BID  . senna  1 tablet Oral BID  . sodium chloride flush  3 mL Intravenous Q12H  . sodium chloride HYPERTONIC  4 mL Nebulization BID  . valACYclovir  500 mg Per Tube Daily  . vitamin C  500 mg Per Tube Daily   Continuous Infusions: . sodium chloride    . cefTAZidime (FORTAZ)  IV Stopped (01/25/17 0941)  . dextrose 5 % and 0.45% NaCl    . vancomycin Stopped (01/25/17 0752)     LOS: 1 day    Time spent: >30 MINS    Reyne Dumas  [facility] Pager [phone]. If 7PM-7AM, please contact night-coverage at [email or web address], password [facility] 01/25/2017, 12:07 PM  LOS: 1 day

## 2017-01-25 NOTE — Progress Notes (Signed)
[facility]  S: Breathing improved today, still on 35% O2  O: Vitals:   01/25/17 0402 01/25/17 0742 01/25/17 1025 01/25/17 1135  BP: (!) 219/182 (!) 145/89  107/63  Pulse: 100 (!) 112  92  Resp: (!) 34 (!) 36  (!) 29  Temp: (!) 97 F (36.1 C) 98.6 F (37 C)  98.3 F (36.8 C)  TempSrc: Axillary Axillary  Oral  SpO2: 93% 90% 96% 97%  Weight:      Height:       RA  General:  Resting comfortably in bed HENT: NCAT trach clean, no drainage PULM: CTA B, normal effort CV: RRR, no mgr GI: BS+, soft, nontender MSK: normal bulk and tone Neuro: asleep, eye opening to voice  CBC    Component Value Date/Time   WBC 10.7 (H) 01/25/2017 0147   RBC 3.92 01/25/2017 0147   HGB 11.7 (L) 01/25/2017 0147   HGB 13.1 09/08/2016 1245   HCT 35.4 (L) 01/25/2017 0147   HCT 40.7 09/08/2016 1245   PLT 214 01/25/2017 0147   PLT 363 09/08/2016 1245   MCV 90.3 01/25/2017 0147   MCV 90 09/08/2016 1245   MCH 29.8 01/25/2017 0147   MCHC 33.1 01/25/2017 0147   RDW 15.8 (H) 01/25/2017 0147   RDW 13.5 09/08/2016 1245   LYMPHSABS 0.9 01/24/2017 1011   MONOABS 1.3 (H) 01/24/2017 1011   EOSABS 0.0 01/24/2017 1011   BASOSABS 0.0 01/24/2017 1011   Resp culture pending Resp viral panel negative  Impression/plan:  Acute respiratory failure with hypoxemia: continue O2 to maintain O2 saturation > 90% Acute tracheobronchitis, history of MRSA: continue current antibiotics, hopeful to narrow to oral regimen based on culture result.  Continue hypertonic saline, chest physiotherapy.   Overall seems improved today, hopeful for short hospitalization  Roselie Awkward, MD [facility] Pager: [phone] Cell: [phone] After 3pm or if no response, call [phone]

## 2017-01-26 ENCOUNTER — Ambulatory Visit: Payer: Medicare Other | Admitting: Physical Therapy

## 2017-01-26 DIAGNOSIS — A852 Arthropod-borne viral encephalitis, unspecified: Secondary | ICD-10-CM

## 2017-01-26 LAB — CULTURE, RESPIRATORY W GRAM STAIN: Culture: NORMAL

## 2017-01-26 LAB — MRSA PCR SCREENING: MRSA BY PCR: POSITIVE — AB

## 2017-01-26 LAB — CULTURE, RESPIRATORY

## 2017-01-26 LAB — CULTURE, BLOOD (ROUTINE X 2): Special Requests: ADEQUATE

## 2017-01-26 MED ORDER — POTASSIUM CHLORIDE 20 MEQ/15ML (10%) PO SOLN
20.0000 meq | Freq: Two times a day (BID) | ORAL | Status: AC
  Administered 2017-01-26 – 2017-01-27 (×3): 20 meq
  Filled 2017-01-26 (×3): qty 15

## 2017-01-26 MED ORDER — MUPIROCIN 2 % EX OINT
1.0000 "application " | TOPICAL_OINTMENT | Freq: Two times a day (BID) | CUTANEOUS | Status: AC
  Administered 2017-01-26 – 2017-01-30 (×9): 1 via NASAL
  Filled 2017-01-26: qty 22

## 2017-01-26 MED ORDER — CHLORHEXIDINE GLUCONATE CLOTH 2 % EX PADS
6.0000 | MEDICATED_PAD | Freq: Every day | CUTANEOUS | Status: AC
  Administered 2017-01-26 – 2017-01-30 (×5): 6 via TOPICAL

## 2017-01-26 MED ORDER — SODIUM CHLORIDE 0.9 % IV SOLN
INTRAVENOUS | Status: DC
  Administered 2017-01-26: 12:00:00 via INTRAVENOUS
  Filled 2017-01-26 (×2): qty 1000

## 2017-01-26 MED ORDER — MAGNESIUM SULFATE IN D5W 1-5 GM/100ML-% IV SOLN
1.0000 g | Freq: Once | INTRAVENOUS | Status: AC
  Administered 2017-01-26: 1 g via INTRAVENOUS
  Filled 2017-01-26: qty 100

## 2017-01-26 NOTE — Progress Notes (Signed)
RT weaned FIO2 28% ATC due to stable sats

## 2017-01-26 NOTE — Progress Notes (Signed)
Initial Nutrition Assessment  DOCUMENTATION CODES:   Not applicable  INTERVENTION:   -Continue Osmolite 1.5 @ 120 ml/hr via PEG over 14 hour period (1700-0700)   -Continue bolus feedings of 237 ml Osmolite 1.5 BID (1000 and 1400) via PEG  -Continue 200 ml free water flush every 6 hours  Tube feeding regimen provides 3231 kcal (>100% of needs), 135 grams of protein, and 2441 ml of H2O.     -Recommend SLP evaluation when appropriate to determine safest diet  NUTRITION DIAGNOSIS:   Increased nutrient needs related to chronic illness as evidenced by estimated needs.  GOAL:   Patient will meet greater than or equal to 90% of their needs  MONITOR:   PO intake, Supplement acceptance, Diet advancement, Labs, Weight trends, Skin, I & O's  REASON FOR ASSESSMENT:   Consult Enteral/tube feeding initiation and management  ASSESSMENT:   Judy Spears is a 67 y.o. 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  Following that hospitalization, she was transferred to [place] for in hospital rehab.  Per family, she was just discharged from there on 01/21/17 and since discharge, had been doing well until morning of 12/31 when she had dyspnea and hypoxia (SpO2 in 80s).  Family attempted chest PT manuevers (which they were coached on during previous admissions), but symptoms did not improve, therefore, she was brought to ED for further eval.  Pt admitted with acute on chronic hypoxic respiratory failure, suspicious for HCAP.   Spoke with pt's private 24 hour caregiver [name]) at bedside. [name] shares that pt just completed 3 weeks of intensive rehab at the [facility]. While at the [facility], TF regimen was modified to assist with medication  administration (caregiver reports that TF regimen was transitioned to a bolus and nocturnal regimen to assist with the effectiveness of Sinemet, which can interact with the amino acids in TF). TF regimen PTA was as follows: Osmolite 1.5 @ 120 ml/hr over 14 hour period (1700-0700) and 237 ml bolus feedings of Osmolite 1.5 BID (1000 and 1400), with 200 ml free water flush every 6 hours. Regimen provides 3231 kcals, 135 grams protein, and 2441 ml free water daily. Per caregiver, pt has been tolerating this regimen well.   Pt was also consuming PO (mainly for comfort) PTA. At [facility], pt underwent multiple SLP evaluations (including FEES and MBSS). Pt was consuming ice chips, thickened soups, and limited to 3 oz of pureed foods per day. Of note, pt was scheduled to have a follow-up MBSS this week per caregiver. PO intake was ceased earlier this week due to decreased alertness and pt's lack of desire to eat.   Reviewed wt hx; noted pt has experienced a 11.3% wt loss over the past year, which is not significant for time frame. Wt has been stable this month and noted a steady increase in wt over the past 3 months, which is favorable.   Caregiver had not further questions at this time and was satisfied with continuing home TF regimen.   Labs reviewed.   NUTRITION - FOCUSED PHYSICAL EXAM:    Most Recent Value  Orbital Region  Moderate depletion  Upper Arm Region  Moderate depletion  Thoracic and Lumbar Region  Unable to assess  Buccal Region  Moderate depletion  Temple Region  Mild  depletion  Clavicle Bone Region  Mild depletion  Clavicle and Acromion Bone Region  Mild depletion  Scapular Bone Region  Mild depletion  Dorsal Hand  Moderate depletion  Patellar Region  Mild depletion  Anterior Thigh Region  Mild depletion  Posterior Calf Region  Moderate depletion  Edema (RD Assessment)  Mild  Hair  Reviewed  Eyes  Reviewed  Mouth  Reviewed  Skin  Reviewed  Nails  Reviewed       Diet  Order:  Diet NPO time specified  EDUCATION NEEDS:   No education needs have been identified at this time  Skin:  Skin Assessment: Reviewed RN Assessment  Last BM:  01/25/17  Height:   Ht Readings from Last 1 Encounters:  01/24/17 5\' 2"  (1.575 m)    Weight:   Wt Readings from Last 1 Encounters:  01/24/17 102 lb (46.3 kg)    Ideal Body Weight:  50 kg  BMI:  Body mass index is 18.66 kg/m.  Estimated Nutritional Needs:   Kcal:  >2000 kcals  Protein:  > 100 grams protein  Fluid:  >2.0 L    Tamas Suen A. Jimmye Norman, RD, LDN, CDE Pager: [phone] After hours Pager: [phone]

## 2017-01-26 NOTE — Progress Notes (Signed)
[facility] PROGRESS NOTE  Judy Spears NFA:[id number] DOB: [birth year] DOA: 01/24/2017   PCP: Marton Redwood, MD     Assessment/Plan: Principal Problem:   Acute tracheobronchitis Active Problems:   Encephalitis, arthropod-borne viral   Tracheostomy status ([place])   Spastic tetraplegia (HCC)   Chronic respiratory failure (HCC)   Tracheobronchitis   PEG (percutaneous endoscopic gastrostomy) status (HCC)   Benign essential HTN   Hyponatremia   Hypokalemia   Chronic mucus hypersecretion, respiratory   67 y.o. 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  Patient was recently admitted 12/05/16 through 01/04/17 for acute on chronic respiratory failure, Following that hospitalization, she was transferred to [facility] for in hospital rehab. Per family, she was just discharged from there on 01/21/17 and since discharge, had been doing well until morning of 01/24/17 when she had dyspnea and hypoxia (SpO2 in 80s). Family attempted chest PT manuevers (which they were coached on during previous admissions), but symptoms did not improve, therefore, she was brought to ED for further eval.  Family felt that she might have had mucous plugging at home. In ED, RT was able to suction several plugs out after saline lavage.  Patient is seen at bedside in conjunction with Dr. Lake Bells from [facility] who is very familiar with patient.  The patient has a history of MRSA and Pseudomonas respiratory infections, Dr. Lake Bells advises coverage for both organisms at this time  Assessment and plan 1)Acute on chronic Hypoxic Respiratory Failure- patient presented with hypoxia, patient has history of recurrent mucous plugging,   given altered mentation, leukocytosis    and hypoxia there is suspicion of HCAP, given history of prior MRSA and Pseudomonas respiratory infection Dr. Lake Bells advises vancomycin and cefepime at this time pending sputum and blood cultures. PCCM consult appreciated, they will  continue to follow, pulmonary toilet, chest PT, bronchodilators and mucolytics, respiratory panel including influenza is negative, however Gram stain noted with organisms polymorphs.   follow results of sputum culture. Hopefully narrow antibiotics over the next 24 hours. FiO2 requirements have been improving. Currently on 28% FiO2  . Continue hypertonic saline and chest physiotherapy. Continue stepdown unit, respiratory therapy   to help with trach management. Continue aspiration precautions. Patient also found to have moderate to severe dysphagia, previously recommended to have Puree/Nectar thick liquids, per family friend she only has TF , does not take anything by mouth at baseline .   2)FEN-resume tube feed, hyponatremia noted, nutritional consult requested  3)Spastic Tetraplegia-continue baclofen, Sinemet, gabapentin and bromocriptine  4)Social/Ethics-plan of care and CODE STATUS discussed with patient's husband at bedside, she is a full code. He  5) hypertension-will order as needed hydralazine for systolic greater than 469 Continue Norvasc  6) dyslipidemia-continue statin  7) swelling right upper and left lower extremity-noticed by caregiver today for the first time. Right upper and bilateral lower extremity Dopplers have been ordered and are pending  DVT prophylaxsis  heparin  Code Status:  Full code     Family Communication: Discussed in detail with the patient, all imaging results, lab results explained to the patient   Disposition Plan:    Pending improvement, not stable for discharge      Consultants:   Critical care   ProcedNone Antibiotics: Anti-infectives (From admission,  onward)   Start     Dose/Rate Route Frequency Ordered Stop    01/26/17 0600  vancomycin (VANCOCIN) IVPB 1000 mg/200 mL premix     1,000 mg 200 mL/hr over 60 Minutes Intravenous Every 8 hours 01/25/17 2319     01/25/17 1000  valACYclovir (VALTREX) tablet 500 mg     500 mg Per Tube Daily 01/24/17 1611     01/24/17 2200  vancomycin (VANCOCIN) IVPB 750 mg/150 ml premix  Status:  Discontinued     750 mg 150 mL/hr over 60 Minutes Intravenous Every 8 hours 01/24/17 1135 01/25/17 2319   01/24/17 1500  cefTAZidime (FORTAZ) 1 g in dextrose 5 % 50 mL IVPB     1 g 100 mL/hr over 30 Minutes Intravenous Every 8 hours 01/24/17 1423     01/24/17 1145  vancomycin (VANCOCIN) IVPB 1000 mg/200 mL premix     1,000 mg 200 mL/hr over 60 Minutes Intravenous  Once 01/24/17 1131 01/24/17 1354   01/24/17 1130  aztreonam (AZACTAM) 2 g in dextrose 5 % 50 mL IVPB     2 g 100 mL/hr over 30 Minutes Intravenous  Once 01/24/17 1124 01/24/17 1249         HPI/Subjective:  Slight cough, still requiring 28% FiO2 which is an improvement. Caregiver noticed right upper and left lower extremity swelling. Patient had a BM yesterday  Objective: Vitals:   01/26/17 0905 01/26/17 0915 01/26/17 1152 01/26/17 1200  BP:   101/61 102/69  Pulse:   91 91  Resp:   (!) 30 (!) 30  Temp:      TempSrc:      SpO2: 97% 98% 98% 98%  Weight:      Height:        Intake/Output Summary (Last 24 hours) at 01/26/2017 1248 Last data filed at 01/26/2017 3419 Gross per 24 hour  Intake 3420 ml  Output 200 ml  Net 3220 ml    Exam:  Examination: Physical Examination: General appearance -chronically ill-appearing, cachectic, not really responsive at this time  mental status -lethargic, not very responsive Eyes - sclera anicteric Neck -tracheostomy with trach collar  chest -scattered rhonchi bilaterally  heart - S1 and S2 normal, 2/6 SM Abdomen - soft, nontender, nondistended, PEG tube site appears clean dry and intact Neurological -contractures/atrophy, apparently this is her baseline Extremities  - no pedal edema noted, intact peripheral pulses  Skin - warm, dry, abdominal area ecchymosis from prior subcu heparin product injection during prior hospitalization apparently     Data Reviewed: I have personally reviewed following labs and imaging studies  Micro Results Recent Results (from the past 240 hour(s))  Culture, respiratory (NON-Expectorated)     Status: None (Preliminary result)   Collection Time: 01/24/17 10:19 AM  Result Value Ref Range Status   Specimen Description TRACHEAL ASPIRATE  Final   Special Requests NONE  Final   Gram Stain   Final    MODERATE WBC PRESENT, PREDOMINANTLY PMN ABUNDANT GRAM NEGATIVE RODS ABUNDANT GRAM POSITIVE COCCI ABUNDANT GRAM POSITIVE RODS    Culture CULTURE REINCUBATED FOR BETTER GROWTH  Final   Report Status PENDING  Incomplete  Respiratory Panel by PCR     Status: None   Collection Time: 01/24/17 11:40 AM  Result Value Ref Range Status   Adenovirus NOT DETECTED NOT DETECTED Final   Coronavirus 229E NOT DETECTED NOT DETECTED Final   Coronavirus HKU1 NOT DETECTED NOT DETECTED Final   Coronavirus NL63 NOT DETECTED NOT DETECTED Final  Coronavirus OC43 NOT DETECTED NOT DETECTED Final   Metapneumovirus NOT DETECTED NOT DETECTED Final   Rhinovirus / Enterovirus NOT DETECTED NOT DETECTED Final   Influenza A NOT DETECTED NOT DETECTED Final   Influenza B NOT DETECTED NOT DETECTED Final   Parainfluenza Virus 1 NOT DETECTED NOT DETECTED Final   Parainfluenza Virus 2 NOT DETECTED NOT DETECTED Final   Parainfluenza Virus 3 NOT DETECTED NOT DETECTED Final   Parainfluenza Virus 4 NOT DETECTED NOT DETECTED Final   Respiratory Syncytial Virus NOT DETECTED NOT DETECTED Final   Bordetella pertussis NOT DETECTED NOT DETECTED Final   Chlamydophila pneumoniae NOT DETECTED NOT DETECTED Final   Mycoplasma pneumoniae NOT DETECTED NOT DETECTED Final  Culture, blood (Routine x 2)     Status: Abnormal   Collection Time: 01/24/17 11:45 AM  Result Value  Ref Range Status   Specimen Description BLOOD RIGHT FOREARM  Final   Special Requests   Final    BOTTLES DRAWN AEROBIC AND ANAEROBIC Blood Culture adequate volume   Culture  Setup Time   Final    GRAM POSITIVE COCCI IN CLUSTERS AEROBIC BOTTLE ONLY CRITICAL RESULT CALLED TO, READ BACK BY AND VERIFIED WITH: PHARMD B MANCHERIL [id number] MLM    Culture (A)  Final    STAPHYLOCOCCUS SPECIES (COAGULASE NEGATIVE) THE SIGNIFICANCE OF ISOLATING THIS ORGANISM FROM A SINGLE SET OF BLOOD CULTURES WHEN MULTIPLE SETS ARE DRAWN IS UNCERTAIN. PLEASE NOTIFY THE MICROBIOLOGY DEPARTMENT WITHIN ONE WEEK IF SPECIATION AND SENSITIVITIES ARE REQUIRED.    Report Status 01/26/2017 FINAL  Final  Blood Culture ID Panel (Reflexed)     Status: Abnormal   Collection Time: 01/24/17 11:45 AM  Result Value Ref Range Status   Enterococcus species NOT DETECTED NOT DETECTED Final   Listeria monocytogenes NOT DETECTED NOT DETECTED Final   Staphylococcus species DETECTED (A) NOT DETECTED Final    Comment: Methicillin (oxacillin) resistant coagulase negative staphylococcus. Possible blood culture contaminant (unless isolated from more than one blood culture draw or clinical case suggests pathogenicity). No antibiotic treatment is indicated for blood  culture contaminants. CRITICAL RESULT CALLED TO, READ BACK BY AND VERIFIED WITH: PHARMD B MANCHERIL [id number] MLM    Staphylococcus aureus NOT DETECTED NOT DETECTED Final   Methicillin resistance DETECTED (A) NOT DETECTED Final    Comment: CRITICAL RESULT CALLED TO, READ BACK BY AND VERIFIED WITH: PHARMD B MANCHERIL [id number] MLM    Streptococcus species NOT DETECTED NOT DETECTED Final   Streptococcus agalactiae NOT DETECTED NOT DETECTED Final   Streptococcus pneumoniae NOT DETECTED NOT DETECTED Final   Streptococcus pyogenes NOT DETECTED NOT DETECTED Final   Acinetobacter baumannii NOT DETECTED NOT DETECTED Final   Enterobacteriaceae species NOT DETECTED NOT DETECTED  Final   Enterobacter cloacae complex NOT DETECTED NOT DETECTED Final   Escherichia coli NOT DETECTED NOT DETECTED Final   Klebsiella oxytoca NOT DETECTED NOT DETECTED Final   Klebsiella pneumoniae NOT DETECTED NOT DETECTED Final   Proteus species NOT DETECTED NOT DETECTED Final   Serratia marcescens NOT DETECTED NOT DETECTED Final   Haemophilus influenzae NOT DETECTED NOT DETECTED Final   Neisseria meningitidis NOT DETECTED NOT DETECTED Final   Pseudomonas aeruginosa NOT DETECTED NOT DETECTED Final   Candida albicans NOT DETECTED NOT DETECTED Final   Candida glabrata NOT DETECTED NOT DETECTED Final   Candida krusei NOT DETECTED NOT DETECTED Final   Candida parapsilosis NOT DETECTED NOT DETECTED Final   Candida tropicalis NOT DETECTED NOT DETECTED Final  Culture, blood (Routine x  2)     Status: None (Preliminary result)   Collection Time: 01/24/17 11:53 AM  Result Value Ref Range Status   Specimen Description BLOOD LEFT HAND  Final   Special Requests   Final    BOTTLES DRAWN AEROBIC AND ANAEROBIC Blood Culture adequate volume   Culture NO GROWTH 1 DAY  Final   Report Status PENDING  Incomplete  Urine culture     Status: Abnormal   Collection Time: 01/24/17  1:01 PM  Result Value Ref Range Status   Specimen Description URINE, CLEAN CATCH  Final   Special Requests Normal  Final   Culture MULTIPLE SPECIES PRESENT, SUGGEST RECOLLECTION (A)  Final   Report Status 01/25/2017 FINAL  Final    Radiology Reports Dg Chest Port 1 View  Result Date: 01/24/2017 CLINICAL DATA:  Respiratory distress, tracheostomy patient. EXAM: PORTABLE CHEST 1 VIEW COMPARISON:  Chest x-ray of January 02, 2017 FINDINGS: The lungs are less well inflated today. There remain increased lung markings at the left lung base but the previously demonstrated confluent density has improved considerably. The heart is normal in size. The central pulmonary vascularity is mildly prominent but stable. There is no pleural  effusion. The tracheostomy tube tip projects at the inferior margin of the clavicular heads. There is calcification in the wall of the aortic arch. The bony thorax exhibits no acute abnormality. A PEG tube is visible in the left upper abdomen. IMPRESSION: Improved appearance of the left lower lobe consistent with decreased atelectasis or pneumonia. No definite acute cardiopulmonary abnormality. Electronically Signed   By: David  Martinique M.D.   On: 01/24/2017 10:03   Dg Chest Port 1 View  Result Date: 01/02/2017 CLINICAL DATA:  67 year old female with pneumonia. EXAM: PORTABLE CHEST 1 VIEW COMPARISON:  01/01/2017 FINDINGS: Tracheostomy tube in place. Postsurgical changes are noted at the left mid lung. Cardiomediastinal silhouette unchanged in size and morphology. Right basilar airspace opacities stable to slightly decreased from prior examination. Stable appearance of the left lung base with atelectasis and possible small effusion. IMPRESSION: 1. Stable to slightly improved right lower lobe pneumonia. 2. Stable left basilar atelectasis. Question small associated effusion. Superimposed consolidation difficult to exclude in this setting. Electronically Signed   By: Kristopher Oppenheim M.D.   On: 01/02/2017 07:53   Dg Chest Port 1 View  Result Date: 01/01/2017 CLINICAL DATA:  Pneumonia EXAM: PORTABLE CHEST 1 VIEW COMPARISON:  12/31/2016 FINDINGS: Tracheostomy tube in good position. Normal cardiac silhouette. Faint airspace disease in the RIGHT lower lobe is slightly increased in prominence compared to prior. Mild LEFT basilar atelectasis with tenting of the diaphragms unchanged. IMPRESSION: Subtle RIGHT lower lobe pneumonia. Stable LEFT basilar atelectasis. Electronically Signed   By: Suzy Bouchard M.D.   On: 01/01/2017 10:04   Dg Chest Port 1 View  Result Date: 12/31/2016 CLINICAL DATA:  Tracheostomy. EXAM: PORTABLE CHEST 1 VIEW COMPARISON:  Radiograph December 25, 2016. FINDINGS: Stable cardiomediastinal  silhouette. Tracheostomy tube is in grossly good position. No pneumothorax is noted. Mild right basilar subsegmental atelectasis is noted. There appears to be increased atelectasis or infiltrate of left lower lobe with possible mild left pleural effusion. Bony thorax is unremarkable. IMPRESSION: Stable tracheostomy tube. Mild right basilar subsegmental atelectasis. Increased left lower lobe atelectasis or infiltrate is noted with possible mild left pleural effusion. Endobronchial occlusion cannot be excluded. CT or bronchoscopy may be performed for further evaluation. Electronically Signed   By: Marijo Conception, M.D.   On: 12/31/2016 08:38   Dg  Abd Portable 1v  Result Date: 01/25/2017 CLINICAL DATA:  Constipation.  PEG tube. EXAM: PORTABLE ABDOMEN - 1 VIEW COMPARISON:  None. FINDINGS: The bowel gas pattern is normal. Opaque tube is projected over the distal stomach. Mild bowel content is identified in the colon. IMPRESSION: No bowel obstruction. Electronically Signed   By: Abelardo Diesel M.D.   On: 01/25/2017 20:13     CBC Recent Labs  Lab 01/24/17 1011 01/25/17 0147  WBC 14.9* 10.7*  HGB 12.0 11.7*  HCT 37.0 35.4*  PLT 248 214  MCV 90.7 90.3  MCH 29.4 29.8  MCHC 32.4 33.1  RDW 15.9* 15.8*  LYMPHSABS 0.9  --   MONOABS 1.3*  --   EOSABS 0.0  --   BASOSABS 0.0  --     Chemistries  Recent Labs  Lab 01/24/17 1011 01/25/17 0147  NA 129* 131*  K 4.1 3.3*  CL 92* 90*  CO2 26 27  GLUCOSE 134* 129*  BUN 22* 15  CREATININE 0.40* <0.30*  CALCIUM 8.9 8.5*  AST 53*  --   ALT 25  --   ALKPHOS 113  --   BILITOT 0.9  --    ------------------------------------------------------------------------------------------------------------------ CrCl cannot be calculated (This lab value cannot be used to calculate CrCl because it is not a number: <0.30). ------------------------------------------------------------------------------------------------------------------ No results for input(s):  HGBA1C in the last 72 hours. ------------------------------------------------------------------------------------------------------------------ No results for input(s): CHOL, HDL, LDLCALC, TRIG, CHOLHDL, LDLDIRECT in the last 72 hours. ------------------------------------------------------------------------------------------------------------------ No results for input(s): TSH, T4TOTAL, T3FREE, THYROIDAB in the last 72 hours.  Invalid input(s): FREET3 ------------------------------------------------------------------------------------------------------------------ No results for input(s): VITAMINB12, FOLATE, FERRITIN, TIBC, IRON, RETICCTPCT in the last 72 hours.  Coagulation profile Recent Labs  Lab 01/24/17 1011  INR 1.08    No results for input(s): DDIMER in the last 72 hours.  Cardiac Enzymes No results for input(s): CKMB, TROPONINI, MYOGLOBIN in the last 168 hours.  Invalid input(s): CK ------------------------------------------------------------------------------------------------------------------ Invalid input(s): POCBNP   CBG: No results for input(s): GLUCAP in the last 168 hours.     Studies: Dg Abd Portable 1v  Result Date: 01/25/2017 CLINICAL DATA:  Constipation.  PEG tube. EXAM: PORTABLE ABDOMEN - 1 VIEW COMPARISON:  None. FINDINGS: The bowel gas pattern is normal. Opaque tube is projected over the distal stomach. Mild bowel content is identified in the colon. IMPRESSION: No bowel obstruction. Electronically Signed   By: Abelardo Diesel M.D.   On: 01/25/2017 20:13      Lab Results  Component Value Date   HGBA1C 6.0 (H) 01/02/2016   Lab Results  Component Value Date   LDLCALC 89 03/29/2014   CREATININE <0.30 (L) 01/25/2017       Scheduled Meds: . acetaminophen  500 mg Per Tube TID  . albuterol  2.5 mg Inhalation BID  . amantadine  150 mg Per Tube BID  . amLODipine  10 mg Per Tube Daily  . atorvastatin  20 mg Per Tube QHS  . baclofen  5 mg Per Tube  QID  . bethanechol  5 mg Per Tube Daily  . bromocriptine  5 mg Oral Daily  . calcium-vitamin D  1 tablet Per Tube Q breakfast  . carbidopa-levodopa  2 tablet Per Tube TID BM  . chlorhexidine  15 mL Mouth/Throat BID  . cholecalciferol  2,000 Units Oral Daily  . feeding supplement (OSMOLITE 1.5 CAL)  1,560 mL Per Tube Daily  . feeding supplement (OSMOLITE 1.5 CAL)  237 mL Per Tube BID  . fluticasone  1  spray Each Nare Daily  . free water  200 mL Per Tube Q6H  . gabapentin  100 mg Per Tube BID  . guaiFENesin  10 mL Per Tube QID  . heparin  5,000 Units Subcutaneous Q8H  . ketoconazole  1 application Topical BID  . loratadine  10 mg Per Tube Daily  . Melatonin  6 mg Per Tube QHS  . multivitamin  15 mL Per Tube Q0600  . pantoprazole sodium  40 mg Per Tube Daily  . polyvinyl alcohol  1 drop Both Eyes Daily  . potassium chloride  20 mEq Per Tube BID  . primidone  50 mg Per Tube QID  . saccharomyces boulardii  250 mg Per Tube BID  . senna  1 tablet Oral BID  . sodium chloride HYPERTONIC  4 mL Nebulization BID  . valACYclovir  500 mg Per Tube Daily  . vitamin C  500 mg Per Tube Daily   Continuous Infusions: . cefTAZidime (FORTAZ)  IV Stopped (01/26/17 0740)  . dextrose 5 % and 0.45% NaCl 30 mL/hr at 01/26/17 0500  . magnesium sulfate 1 - 4 g bolus IVPB    . 0.9 % sodium chloride with kcl 50 mL/hr at 01/26/17 1225  . vancomycin Stopped (01/26/17 0710)     LOS: 2 days    Time spent: >30 MINS    Reyne Dumas  [facility] Pager [phone]. If 7PM-7AM, please contact night-coverage at [email or web address], password [facility] 01/26/2017, 12:48 PM  LOS: 2 days

## 2017-01-27 ENCOUNTER — Inpatient Hospital Stay (HOSPITAL_COMMUNITY): Payer: Medicare Other

## 2017-01-27 DIAGNOSIS — R609 Edema, unspecified: Secondary | ICD-10-CM

## 2017-01-27 DIAGNOSIS — Z930 Tracheostomy status: Secondary | ICD-10-CM

## 2017-01-27 DIAGNOSIS — M7989 Other specified soft tissue disorders: Secondary | ICD-10-CM

## 2017-01-27 DIAGNOSIS — R140 Abdominal distension (gaseous): Secondary | ICD-10-CM

## 2017-01-27 LAB — COMPREHENSIVE METABOLIC PANEL
ALBUMIN: 2.6 g/dL — AB (ref 3.5–5.0)
ALT: 64 U/L — ABNORMAL HIGH (ref 14–54)
AST: 45 U/L — AB (ref 15–41)
Alkaline Phosphatase: 114 U/L (ref 38–126)
Anion gap: 8 (ref 5–15)
BILIRUBIN TOTAL: 0.8 mg/dL (ref 0.3–1.2)
BUN: 10 mg/dL (ref 6–20)
CHLORIDE: 98 mmol/L — AB (ref 101–111)
CO2: 25 mmol/L (ref 22–32)
Calcium: 8.2 mg/dL — ABNORMAL LOW (ref 8.9–10.3)
Creatinine, Ser: <0.3 mg/dL — ABNORMAL LOW (ref 0.44–1.00)
GLUCOSE: 128 mg/dL — AB (ref 65–99)
POTASSIUM: 4.2 mmol/L (ref 3.5–5.1)
Sodium: 131 mmol/L — ABNORMAL LOW (ref 135–145)
Total Protein: 5.8 g/dL — ABNORMAL LOW (ref 6.5–8.1)

## 2017-01-27 LAB — CBC
HEMATOCRIT: 32.4 % — AB (ref 36.0–46.0)
HEMOGLOBIN: 10.6 g/dL — AB (ref 12.0–15.0)
MCH: 29.6 pg (ref 26.0–34.0)
MCHC: 32.7 g/dL (ref 30.0–36.0)
MCV: 90.5 fL (ref 78.0–100.0)
Platelets: 299 10*3/uL (ref 150–400)
RBC: 3.58 MIL/uL — ABNORMAL LOW (ref 3.87–5.11)
RDW: 15.7 % — ABNORMAL HIGH (ref 11.5–15.5)
WBC: 9.9 10*3/uL (ref 4.0–10.5)

## 2017-01-27 LAB — VANCOMYCIN, TROUGH: Vancomycin Tr: 27 ug/mL (ref 15–20)

## 2017-01-27 MED ORDER — SODIUM CHLORIDE 0.9 % IV SOLN
INTRAVENOUS | Status: DC
Start: 2017-01-27 — End: 2017-01-29
  Administered 2017-01-27 – 2017-01-28 (×2): via INTRAVENOUS

## 2017-01-27 NOTE — Progress Notes (Signed)
[facility] PROGRESS NOTE  Judy Spears YHC:[id number] DOB: 1950/09/07 DOA: 01/24/2017   PCP: Marton Redwood, MD     Assessment/Plan: Principal Problem:   Acute tracheobronchitis Active Problems:   Encephalitis, arthropod-borne viral   Tracheostomy status ([place])   Spastic tetraplegia (HCC)   Chronic respiratory failure (HCC)   Tracheobronchitis   PEG (percutaneous endoscopic gastrostomy) status (HCC)   Benign essential HTN   Hyponatremia   Hypokalemia   Chronic mucus hypersecretion, respiratory   67 y.o. 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  Patient was recently admitted 12/05/16 through 01/04/17 for acute on chronic respiratory failure, Following that hospitalization, she was transferred to [facility] for in hospital rehab. Per family, she was just discharged from there on 01/21/17 and since discharge, had been doing well until morning of 01/24/17 when she had dyspnea and hypoxia (SpO2 in 80s). Family attempted chest PT manuevers (which they were coached on during previous admissions), but symptoms did not improve, therefore, she was brought to ED for further eval.  Family felt that she might have had mucous plugging at home. In ED, RT was able to suction several plugs out after saline lavage.  Patient is seen at bedside.  The patient has a history of MRSA and Pseudomonas respiratory infections,  she is on IV vancomycin and ceftazidime as per recommendations by Dr. Lake Bells.  Assessment and plan 1)Acute on chronic Hypoxic Respiratory Failure- patient presented with hypoxia, patient has history of recurrent mucous plugging,   given altered mentation, leukocytosis   and hypoxia there is suspicion of HCAP, given history of  prior MRSA and Pseudomonas respiratory infection Dr. Lake Bells advised vancomycin and cefepime at this time pending sputum and blood cultures. PCCM consult appreciated, they will  continue to follow, pulmonary toilet, chest PT, bronchodilators and mucolytics, respiratory panel including influenza is negative, however Gram stain noted with organisms polymorphs.   Respiratory culture showing normal flora. FiO2 requirements have been improving. Continue hypertonic saline and chest physiotherapy. Continue stepdown unit, respiratory therapy to help with trach management. Continue aspiration precautions. Patient also found to have moderate to severe dysphagia, previously recommended to have Puree/Nectar thick liquids, per family she only has TF , does not take anything by mouth at baseline .  2)FEN-on tube feed, hyponatremia noted, nutritional consult requested.  Also patient is on normal saline at 50 cc with potassium in the fluids.  She is also on daily oral potassium supplement.  Today her potassium level is 4.2.  We will switch the fluids to normal saline at 50 cc without potassium.  We will hold her daily potassium.  Recheck BMP in a.m.  3)Spastic Tetraplegia-continue baclofen, Sinemet, gabapentin and bromocriptine  4)Social/Ethics-plan of care and CODE STATUS discussed with patient's husband at bedside, she is a full code. He  5) hypertension- as needed hydralazine for systolic greater than 517 Continue Norvasc  6) dyslipidemia-continue statin  7) swelling/erythema right upper and left lower extremity thigh area-noticed yesterday by caregiver today for the first time. Right upper and bilateral lower extremity Dopplers have been ordered and are pending.  We will continue to monitor the area.  No open lesions noted at this time.  8) abdominal distention-husband reports that her abdomen appears to be more distended.  Per nursing staff last bowel movement was day before yesterday.  Patient has mildly  diminished bowel sounds.  Unable to ascertain if she has tenderness.  Will order KUB.  She reportedly takes MiraLAX as needed at home.  Here she is only getting the stool softener.  DVT prophylaxsis  heparin  Code Status:  Full code     Family Communication: Discussed in detail with the patient, all imaging results, lab results explained to the patient   Disposition Plan:    Pending improvement, not stable for discharge   Consultants:   Critical care   ProcedNone Antibiotics: Anti-infectives (From admission, onward)   Start     Dose/Rate Route Frequency Ordered Stop   01/26/17 0600  vancomycin (VANCOCIN) IVPB 1000 mg/200 mL premix     1,000 mg 200 mL/hr over 60 Minutes Intravenous Every 8 hours 01/25/17 2319     01/25/17 1000  valACYclovir (VALTREX) tablet 500 mg     500 mg Per Tube Daily 01/24/17 1611     01/24/17 2200  vancomycin (VANCOCIN) IVPB 750 mg/150 ml premix  Status:  Discontinued     750 mg 150 mL/hr over 60 Minutes Intravenous Every 8 hours 01/24/17 1135 01/25/17 2319   01/24/17 1500  cefTAZidime (FORTAZ) 1 g in dextrose 5 % 50 mL IVPB     1 g 100 mL/hr over 30 Minutes Intravenous Every 8 hours 01/24/17 1423     01/24/17 1145  vancomycin (VANCOCIN) IVPB 1000 mg/200 mL premix     1,000 mg 200 mL/hr over 60 Minutes Intravenous  Once 01/24/17 1131 01/24/17 1354   01/24/17 1130  aztreonam (AZACTAM) 2 g in dextrose 5 % 50 mL IVPB     2 g 100 mL/hr over 30 Minutes Intravenous  Once 01/24/17 1124 01/24/17 1249      HPI/Subjective: Still having some cough and secretions per the husband.  Caregiver noticed right upper and left lower extremity high swelling.  Per the husband patient having some abdominal distention.  Last BM 2 days ago.  Objective: Vitals:   01/27/17 0316 01/27/17 0850 01/27/17 0851 01/27/17 1012  BP: 119/75   116/73  Pulse: 91     Resp: (!) 40     Temp: 98.4 F (36.9 C)     TempSrc: Oral     SpO2: 98% 90% 95%   Weight:      Height:         Intake/Output Summary (Last 24 hours) at 01/27/2017 1042 Last data filed at 01/27/2017 8295 Gross per 24 hour  Intake 2959.17 ml  Output 2350 ml  Net 609.17 ml    Exam:  Examination: Physical Examination: General appearance -chronically ill-appearing, cachectic, opening eyes but does not follow commands mental status -opening eyes but does not follow commands. Eyes - sclera anicteric Neck -tracheostomy with trach collar  chest - few scattered rhonchi bilaterally  heart - S1 and S2 normal, 2/6 SM Abdomen -distended, somewhat decreased bowel sounds,  unable to ascertain if tenderness present, PEG tube site appears clean dry and intact Neurological -contractures/atrophy, apparently this is her baseline Extremities - no pedal edema noted, intact peripheral pulses, left thigh swelling, induration, mild erythema Skin - warm, dry, abdominal area ecchymosis from prior subcu heparin product injection during prior hospitalization apparently   Data Reviewed: I have personally reviewed following labs and imaging studies  Micro Results Recent Results (from the past 240 hour(s))  Culture, respiratory (NON-Expectorated)     Status: None   Collection Time: 01/24/17  10:19 AM  Result Value Ref Range Status   Specimen Description TRACHEAL ASPIRATE  Final   Special Requests NONE  Final   Gram Stain   Final    MODERATE WBC PRESENT, PREDOMINANTLY PMN ABUNDANT GRAM NEGATIVE RODS ABUNDANT GRAM POSITIVE COCCI ABUNDANT GRAM POSITIVE RODS    Culture Consistent with normal respiratory flora.  Final   Report Status 01/26/2017 FINAL  Final  Respiratory Panel by PCR     Status: None   Collection Time: 01/24/17 11:40 AM  Result Value Ref Range Status   Adenovirus NOT DETECTED NOT DETECTED Final   Coronavirus 229E NOT DETECTED NOT DETECTED Final   Coronavirus HKU1 NOT DETECTED NOT DETECTED Final   Coronavirus NL63 NOT DETECTED NOT DETECTED Final   Coronavirus OC43 NOT DETECTED NOT DETECTED Final    Metapneumovirus NOT DETECTED NOT DETECTED Final   Rhinovirus / Enterovirus NOT DETECTED NOT DETECTED Final   Influenza A NOT DETECTED NOT DETECTED Final   Influenza B NOT DETECTED NOT DETECTED Final   Parainfluenza Virus 1 NOT DETECTED NOT DETECTED Final   Parainfluenza Virus 2 NOT DETECTED NOT DETECTED Final   Parainfluenza Virus 3 NOT DETECTED NOT DETECTED Final   Parainfluenza Virus 4 NOT DETECTED NOT DETECTED Final   Respiratory Syncytial Virus NOT DETECTED NOT DETECTED Final   Bordetella pertussis NOT DETECTED NOT DETECTED Final   Chlamydophila pneumoniae NOT DETECTED NOT DETECTED Final   Mycoplasma pneumoniae NOT DETECTED NOT DETECTED Final  Culture, blood (Routine x 2)     Status: Abnormal   Collection Time: 01/24/17 11:45 AM  Result Value Ref Range Status   Specimen Description BLOOD RIGHT FOREARM  Final   Special Requests   Final    BOTTLES DRAWN AEROBIC AND ANAEROBIC Blood Culture adequate volume   Culture  Setup Time   Final    GRAM POSITIVE COCCI IN CLUSTERS AEROBIC BOTTLE ONLY CRITICAL RESULT CALLED TO, READ BACK BY AND VERIFIED WITH: PHARMD B MANCHERIL [id number] MLM    Culture (A)  Final    STAPHYLOCOCCUS SPECIES (COAGULASE NEGATIVE) THE SIGNIFICANCE OF ISOLATING THIS ORGANISM FROM A SINGLE SET OF BLOOD CULTURES WHEN MULTIPLE SETS ARE DRAWN IS UNCERTAIN. PLEASE NOTIFY THE MICROBIOLOGY DEPARTMENT WITHIN ONE WEEK IF SPECIATION AND SENSITIVITIES ARE REQUIRED.    Report Status 01/26/2017 FINAL  Final  Blood Culture ID Panel (Reflexed)     Status: Abnormal   Collection Time: 01/24/17 11:45 AM  Result Value Ref Range Status   Enterococcus species NOT DETECTED NOT DETECTED Final   Listeria monocytogenes NOT DETECTED NOT DETECTED Final   Staphylococcus species DETECTED (A) NOT DETECTED Final    Comment: Methicillin (oxacillin) resistant coagulase negative staphylococcus. Possible blood culture contaminant (unless isolated from more than one blood culture draw or clinical  case suggests pathogenicity). No antibiotic treatment is indicated for blood  culture contaminants. CRITICAL RESULT CALLED TO, READ BACK BY AND VERIFIED WITH: PHARMD B MANCHERIL [id number] [phone] MLM    Staphylococcus aureus NOT DETECTED NOT DETECTED Final   Methicillin resistance DETECTED (A) NOT DETECTED Final    Comment: CRITICAL RESULT CALLED TO, READ BACK BY AND VERIFIED WITH: PHARMD B MANCHERIL [id number] [phone] MLM    Streptococcus species NOT DETECTED NOT DETECTED Final   Streptococcus agalactiae NOT DETECTED NOT DETECTED Final   Streptococcus pneumoniae NOT DETECTED NOT DETECTED Final   Streptococcus pyogenes NOT DETECTED NOT DETECTED Final   Acinetobacter baumannii NOT DETECTED NOT DETECTED Final   Enterobacteriaceae species NOT DETECTED NOT DETECTED Final  Enterobacter cloacae complex NOT DETECTED NOT DETECTED Final   Escherichia coli NOT DETECTED NOT DETECTED Final   Klebsiella oxytoca NOT DETECTED NOT DETECTED Final   Klebsiella pneumoniae NOT DETECTED NOT DETECTED Final   Proteus species NOT DETECTED NOT DETECTED Final   Serratia marcescens NOT DETECTED NOT DETECTED Final   Haemophilus influenzae NOT DETECTED NOT DETECTED Final   Neisseria meningitidis NOT DETECTED NOT DETECTED Final   Pseudomonas aeruginosa NOT DETECTED NOT DETECTED Final   Candida albicans NOT DETECTED NOT DETECTED Final   Candida glabrata NOT DETECTED NOT DETECTED Final   Candida krusei NOT DETECTED NOT DETECTED Final   Candida parapsilosis NOT DETECTED NOT DETECTED Final   Candida tropicalis NOT DETECTED NOT DETECTED Final  Culture, blood (Routine x 2)     Status: None (Preliminary result)   Collection Time: 01/24/17 11:53 AM  Result Value Ref Range Status   Specimen Description BLOOD LEFT HAND  Final   Special Requests   Final    BOTTLES DRAWN AEROBIC AND ANAEROBIC Blood Culture adequate volume   Culture NO GROWTH 3 DAYS  Final   Report Status PENDING  Incomplete  Urine culture     Status: Abnormal    Collection Time: 01/24/17  1:01 PM  Result Value Ref Range Status   Specimen Description URINE, CLEAN CATCH  Final   Special Requests Normal  Final   Culture MULTIPLE SPECIES PRESENT, SUGGEST RECOLLECTION (A)  Final   Report Status 01/25/2017 FINAL  Final  MRSA PCR Screening     Status: Abnormal   Collection Time: 01/26/17 12:47 PM  Result Value Ref Range Status   MRSA by PCR POSITIVE (A) NEGATIVE Final    Comment:        The GeneXpert MRSA Assay (FDA approved for NASAL specimens only), is one component of a comprehensive MRSA colonization surveillance program. It is not intended to diagnose MRSA infection nor to guide or monitor treatment for MRSA infections. RESULT CALLED TO, READ BACK BY AND VERIFIED WITH: Levi Aland RN 14:40 01/26/17 ([email or web address])     Radiology Reports Dg Chest Port 1 View  Result Date: 01/24/2017 CLINICAL DATA:  Respiratory distress, tracheostomy patient. EXAM: PORTABLE CHEST 1 VIEW COMPARISON:  Chest x-ray of January 02, 2017 FINDINGS: The lungs are less well inflated today. There remain increased lung markings at the left lung base but the previously demonstrated confluent density has improved considerably. The heart is normal in size. The central pulmonary vascularity is mildly prominent but stable. There is no pleural effusion. The tracheostomy tube tip projects at the inferior margin of the clavicular heads. There is calcification in the wall of the aortic arch. The bony thorax exhibits no acute abnormality. A PEG tube is visible in the left upper abdomen. IMPRESSION: Improved appearance of the left lower lobe consistent with decreased atelectasis or pneumonia. No definite acute cardiopulmonary abnormality. Electronically Signed   By: David  Martinique M.D.   On: 01/24/2017 10:03   Dg Chest Port 1 View  Result Date: 01/02/2017 CLINICAL DATA:  67 year old female with pneumonia. EXAM: PORTABLE CHEST 1 VIEW COMPARISON:  01/01/2017 FINDINGS: Tracheostomy tube in place.  Postsurgical changes are noted at the left mid lung. Cardiomediastinal silhouette unchanged in size and morphology. Right basilar airspace opacities stable to slightly decreased from prior examination. Stable appearance of the left lung base with atelectasis and possible small effusion. IMPRESSION: 1. Stable to slightly improved right lower lobe pneumonia. 2. Stable left basilar atelectasis. Question small associated effusion. Superimposed consolidation difficult to  exclude in this setting. Electronically Signed   By: Kristopher Oppenheim M.D.   On: 01/02/2017 07:53   Dg Chest Port 1 View  Result Date: 01/01/2017 CLINICAL DATA:  Pneumonia EXAM: PORTABLE CHEST 1 VIEW COMPARISON:  12/31/2016 FINDINGS: Tracheostomy tube in good position. Normal cardiac silhouette. Faint airspace disease in the RIGHT lower lobe is slightly increased in prominence compared to prior. Mild LEFT basilar atelectasis with tenting of the diaphragms unchanged. IMPRESSION: Subtle RIGHT lower lobe pneumonia. Stable LEFT basilar atelectasis. Electronically Signed   By: Suzy Bouchard M.D.   On: 01/01/2017 10:04   Dg Chest Port 1 View  Result Date: 12/31/2016 CLINICAL DATA:  Tracheostomy. EXAM: PORTABLE CHEST 1 VIEW COMPARISON:  Radiograph December 25, 2016. FINDINGS: Stable cardiomediastinal silhouette. Tracheostomy tube is in grossly good position. No pneumothorax is noted. Mild right basilar subsegmental atelectasis is noted. There appears to be increased atelectasis or infiltrate of left lower lobe with possible mild left pleural effusion. Bony thorax is unremarkable. IMPRESSION: Stable tracheostomy tube. Mild right basilar subsegmental atelectasis. Increased left lower lobe atelectasis or infiltrate is noted with possible mild left pleural effusion. Endobronchial occlusion cannot be excluded. CT or bronchoscopy may be performed for further evaluation. Electronically Signed   By: Marijo Conception, M.D.   On: 12/31/2016 08:38   Dg Abd  Portable 1v  Result Date: 01/25/2017 CLINICAL DATA:  Constipation.  PEG tube. EXAM: PORTABLE ABDOMEN - 1 VIEW COMPARISON:  None. FINDINGS: The bowel gas pattern is normal. Opaque tube is projected over the distal stomach. Mild bowel content is identified in the colon. IMPRESSION: No bowel obstruction. Electronically Signed   By: Abelardo Diesel M.D.   On: 01/25/2017 20:13     CBC Recent Labs  Lab 01/24/17 1011 01/25/17 0147  WBC 14.9* 10.7*  HGB 12.0 11.7*  HCT 37.0 35.4*  PLT 248 214  MCV 90.7 90.3  MCH 29.4 29.8  MCHC 32.4 33.1  RDW 15.9* 15.8*  LYMPHSABS 0.9  --   MONOABS 1.3*  --   EOSABS 0.0  --   BASOSABS 0.0  --     Chemistries  Recent Labs  Lab 01/24/17 1011 01/25/17 0147 01/27/17 0231  NA 129* 131* 131*  K 4.1 3.3* 4.2  CL 92* 90* 98*  CO2 26 27 25   GLUCOSE 134* 129* 128*  BUN 22* 15 10  CREATININE 0.40* <0.30* <0.30*  CALCIUM 8.9 8.5* 8.2*  AST 53*  --  45*  ALT 25  --  64*  ALKPHOS 113  --  114  BILITOT 0.9  --  0.8   ------------------------------------------------------------------------------------------------------------------ CrCl cannot be calculated (This lab value cannot be used to calculate CrCl because it is not a number: <0.30). ------------------------------------------------------------------------------------------------------------------ No results for input(s): HGBA1C in the last 72 hours. ------------------------------------------------------------------------------------------------------------------ No results for input(s): CHOL, HDL, LDLCALC, TRIG, CHOLHDL, LDLDIRECT in the last 72 hours. ------------------------------------------------------------------------------------------------------------------ No results for input(s): TSH, T4TOTAL, T3FREE, THYROIDAB in the last 72 hours.  Invalid input(s): FREET3 ------------------------------------------------------------------------------------------------------------------ No results for  input(s): VITAMINB12, FOLATE, FERRITIN, TIBC, IRON, RETICCTPCT in the last 72 hours.  Coagulation profile Recent Labs  Lab 01/24/17 1011  INR 1.08    No results for input(s): DDIMER in the last 72 hours.  Cardiac Enzymes No results for input(s): CKMB, TROPONINI, MYOGLOBIN in the last 168 hours.  Invalid input(s): CK ------------------------------------------------------------------------------------------------------------------ Invalid input(s): POCBNP   CBG: No results for input(s): GLUCAP in the last 168 hours.     Studies: Dg Abd Portable 1v  Result  Date: 01/25/2017 CLINICAL DATA:  Constipation.  PEG tube. EXAM: PORTABLE ABDOMEN - 1 VIEW COMPARISON:  None. FINDINGS: The bowel gas pattern is normal. Opaque tube is projected over the distal stomach. Mild bowel content is identified in the colon. IMPRESSION: No bowel obstruction. Electronically Signed   By: Abelardo Diesel M.D.   On: 01/25/2017 20:13      Lab Results  Component Value Date   HGBA1C 6.0 (H) 01/02/2016   Lab Results  Component Value Date   LDLCALC 89 03/29/2014   CREATININE <0.30 (L) 01/27/2017       Scheduled Meds: . acetaminophen  500 mg Per Tube TID  . albuterol  2.5 mg Inhalation BID  . amantadine  150 mg Per Tube BID  . amLODipine  10 mg Per Tube Daily  . atorvastatin  20 mg Per Tube QHS  . baclofen  5 mg Per Tube QID  . bethanechol  5 mg Per Tube Daily  . bromocriptine  5 mg Oral Daily  . calcium-vitamin D  1 tablet Per Tube Q breakfast  . carbidopa-levodopa  2 tablet Per Tube TID BM  . chlorhexidine  15 mL Mouth/Throat BID  . Chlorhexidine Gluconate Cloth  6 each Topical Q0600  . cholecalciferol  2,000 Units Oral Daily  . feeding supplement (OSMOLITE 1.5 CAL)  1,560 mL Per Tube Daily  . feeding supplement (OSMOLITE 1.5 CAL)  237 mL Per Tube BID  . fluticasone  1 spray Each Nare Daily  . free water  200 mL Per Tube Q6H  . gabapentin  100 mg Per Tube BID  . guaiFENesin  10 mL Per Tube  QID  . heparin  5,000 Units Subcutaneous Q8H  . ketoconazole  1 application Topical BID  . loratadine  10 mg Per Tube Daily  . Melatonin  6 mg Per Tube QHS  . multivitamin  15 mL Per Tube Q0600  . mupirocin ointment  1 application Nasal BID  . pantoprazole sodium  40 mg Per Tube Daily  . polyvinyl alcohol  1 drop Both Eyes Daily  . potassium chloride  20 mEq Per Tube BID  . primidone  50 mg Per Tube QID  . saccharomyces boulardii  250 mg Per Tube BID  . senna  1 tablet Oral BID  . valACYclovir  500 mg Per Tube Daily  . vitamin C  500 mg Per Tube Daily   Continuous Infusions: . cefTAZidime (FORTAZ)  IV Stopped (01/27/17 0723)  . dextrose 5 % and 0.45% NaCl Stopped (01/26/17 0858)  . 0.9 % sodium chloride with kcl 50 mL/hr at 01/27/17 0200  . vancomycin Stopped (01/27/17 9518)     LOS: 3 days    Time spent: >30 MINS    Yaakov Guthrie, MD [facility] Pager [phone]. If 7PM-7AM, please contact night-coverage at [email or web address], password [facility] 01/27/2017, 10:42 AM  LOS: 3 days

## 2017-01-27 NOTE — Progress Notes (Signed)
VASCULAR LAB PRELIMINARY  PRELIMINARY  PRELIMINARY  PRELIMINARY  Right upper extremity venous duplex completed.    Preliminary report:  There is no DVT or SVT noted in the right upper extremity. Basilic and cephalic veins appear thickened with no thrombus noted.   Judy Spears, RVT 01/27/2017, 10:50 AM

## 2017-01-27 NOTE — Progress Notes (Signed)
VASCULAR LAB PRELIMINARY  PRELIMINARY  PRELIMINARY  PRELIMINARY  Bilateral lower extremity venous duplex completed.    Preliminary report:  There is no obvious evidence of DVT or SVT noted in the bilateral lower extremities.   Brian Kocourek, RVT 01/27/2017, 10:51 AM

## 2017-01-27 NOTE — Progress Notes (Addendum)
Pharmacy Antibiotic Note  Juliona Vales Myli Pae is a 67 y.o. female admitted on 01/24/2017 with pneumonia and bacteremia.  Pharmacy has been consulted for Vancomycin dosing. No CBC in several days but WBC previously trending down. Renal function stable. Vancomycin level 27 today collected ~ 3 hours post dose administration. Patient is clinically improving; Spoke to ID pharmacist and he would like to stop Vancomycin and de-escalate therapy;  will collect another vancomycin level tomorrow if therapy continued.  Plan: Continue vancomycin 1000 mg IV q8h Re-check vancomycin trough at steady state  Height: 5\' 2"  (157.5 cm) Weight: 102 lb (46.3 kg) IBW/kg (Calculated) : 50.1  Temp (24hrs), Avg:98.6 F (37 C), Min:98.4 F (36.9 C), Max:98.7 F (37.1 C)  Recent Labs  Lab 01/24/17 1011 01/24/17 1025 01/24/17 1159 01/25/17 0147 01/25/17 1233 01/25/17 2130 01/27/17 0231 01/27/17 0802  WBC 14.9*  --   --  10.7*  --   --   --   --   CREATININE 0.40*  --   --  <0.30*  --   --  <0.30*  --   LATICACIDVEN  --  2.09* 2.32*  --  1.3  --   --   --   VANCOTROUGH  --   --   --   --   --  10*  --  27*    CrCl cannot be calculated (This lab value cannot be used to calculate CrCl because it is not a number: <0.30).    Allergies  Allergen Reactions  . Cefepime Other (See Comments)    Status epilepticus- this reaction is questionable per husband.States that it was determined that pt did not have seizure activity. Status epilepticus  . Tetracyclines & Related Photosensitivity and Other (See Comments)    Blisters (childhood allergy)  . Doxycycline Monohydrate Other (See Comments)    Blisters, photosensitivity- family state that the reaction is actually to tetracycline.  . Pollen Extract Other (See Comments)    Running nose (seasonal allergies)     Jodean Lima Carolyn Maniscalco 01/27/2017 10:19 AM

## 2017-01-28 ENCOUNTER — Inpatient Hospital Stay (HOSPITAL_COMMUNITY): Payer: Medicare Other

## 2017-01-28 DIAGNOSIS — G8250 Quadriplegia, unspecified: Secondary | ICD-10-CM

## 2017-01-28 DIAGNOSIS — E876 Hypokalemia: Secondary | ICD-10-CM

## 2017-01-28 DIAGNOSIS — J9610 Chronic respiratory failure, unspecified whether with hypoxia or hypercapnia: Secondary | ICD-10-CM

## 2017-01-28 DIAGNOSIS — Z931 Gastrostomy status: Secondary | ICD-10-CM

## 2017-01-28 DIAGNOSIS — S72002A Fracture of unspecified part of neck of left femur, initial encounter for closed fracture: Secondary | ICD-10-CM

## 2017-01-28 LAB — BASIC METABOLIC PANEL
Anion gap: 9 (ref 5–15)
BUN: 12 mg/dL (ref 6–20)
CHLORIDE: 93 mmol/L — AB (ref 101–111)
CO2: 27 mmol/L (ref 22–32)
Calcium: 8.7 mg/dL — ABNORMAL LOW (ref 8.9–10.3)
Creatinine, Ser: <0.3 mg/dL — ABNORMAL LOW (ref 0.44–1.00)
Glucose, Bld: 84 mg/dL (ref 65–99)
POTASSIUM: 4.4 mmol/L (ref 3.5–5.1)
SODIUM: 129 mmol/L — AB (ref 135–145)

## 2017-01-28 LAB — CBC WITH DIFFERENTIAL/PLATELET
Basophils Absolute: 0 10*3/uL (ref 0.0–0.1)
Basophils Relative: 0 %
EOS ABS: 0.3 10*3/uL (ref 0.0–0.7)
Eosinophils Relative: 2 %
HCT: 33.1 % — ABNORMAL LOW (ref 36.0–46.0)
HEMOGLOBIN: 10.8 g/dL — AB (ref 12.0–15.0)
LYMPHS ABS: 2 10*3/uL (ref 0.7–4.0)
LYMPHS PCT: 16 %
MCH: 29.3 pg (ref 26.0–34.0)
MCHC: 32.6 g/dL (ref 30.0–36.0)
MCV: 89.9 fL (ref 78.0–100.0)
Monocytes Absolute: 0.9 10*3/uL (ref 0.1–1.0)
Monocytes Relative: 8 %
NEUTROS PCT: 74 %
Neutro Abs: 9.3 10*3/uL — ABNORMAL HIGH (ref 1.7–7.7)
Platelets: 321 10*3/uL (ref 150–400)
RBC: 3.68 MIL/uL — AB (ref 3.87–5.11)
RDW: 15.5 % (ref 11.5–15.5)
WBC: 12.5 10*3/uL — AB (ref 4.0–10.5)

## 2017-01-28 MED ORDER — HYDROCODONE-ACETAMINOPHEN 5-325 MG PO TABS
1.0000 | ORAL_TABLET | ORAL | Status: DC | PRN
  Filled 2017-01-28: qty 1

## 2017-01-28 MED ORDER — OXYCODONE HCL 5 MG PO TABS
2.5000 mg | ORAL_TABLET | ORAL | Status: DC | PRN
  Administered 2017-01-28: 2.5 mg via ORAL
  Administered 2017-01-29 – 2017-01-31 (×2): 5 mg via ORAL
  Administered 2017-02-01: 2.5 mg via ORAL
  Administered 2017-02-02 (×2): 5 mg via ORAL
  Filled 2017-01-28 (×6): qty 1

## 2017-01-28 NOTE — Progress Notes (Signed)
PROGRESS NOTE    Judy Spears  ZHG:[id number] DOB: Jan 27, 1950 DOA: 01/24/2017 PCP: Marton Redwood, MD      Brief Narrative:  Mrs. 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  In the hospital, lactate 2.09, CXR showed persistent left lower lobe opacity; case was discussed with Pulmonology who recommended vancomycin and ceftazidime and inpatient admission for presumed HCAP.       Assessment & Plan:  Principal Problem:   Acute tracheobronchitis Active Problems:   Encephalitis, arthropod-borne viral   Tracheostomy status (HCC)   Spastic tetraplegia (HCC)   Chronic respiratory failure (HCC)   Tracheobronchitis   PEG (percutaneous endoscopic gastrostomy) status (HCC)   Benign essential HTN   Hyponatremia   Hypokalemia   Chronic mucus hypersecretion, respiratory   Abdominal distension   Health care associated pneumonia Acute hypoxic respiratory failure Per family, there was initial improvement over 1-2 days, then now last two days worsening, needing more O2, seeming to be in respiratory distress, more secretions from trach.  CXR this afternoon shows new RIGHT base opacity, WBC rising.  Blood cultures and tracheal aspirate no growth. -Continue vancomycin and ceftazidime -Consultation to Pulmonology, appreciate cares; needs bronchial aspirate?     Chronic respiratory failure with tracheostomy -Trach care per Pulmonology -Continue hypertonic saline, mucolytics, chest PT, bronchodilators  Chronic right hip fracture Acute left hip fracture The patient was noted to have a chronic right hip fracture last July.  Thought to have occurred in June.  Evaluated by Dr. San Jetty from [place] in November, at which point it was a chronic fracture, and she recommended  no intervention.  They also saw another orthopedist at [facility] who said he would be willing to operate, but didn't have time in his schedule until March.  Now yesterday, on radiograph of the abdomen to eval abdomen distension, she was noted to have LEFT acute comminuted hip fracture.  Dr. Leanne Chang discussed with family options to have orthopedics evaluate the patient here.  I have placed call to OTS, awaiting response.  I believe this worsens her prognosis considerably. 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  Will discuss with Orthopedics.  Prognosis is worsening.   Consultants:   Pulmonology  Orthopedics  Procedures:   None  Antimicrobials:   Vancomycin 12/31 >>  Ceftazidime 12/31 >>    Subjective: To me, she can nod head yes to pain, yes to pain in hips.  From family, they state she is still having respiratory distress at times, still having thick secretions.  Belly still distended, redness on both hips.  Objective: Vitals:   01/28/17 1200 01/28/17 1352 01/28/17 1400 01/28/17 1443  BP: (!) 95/59  (!) 112/24   Pulse: 88  87 81  Resp: (!) 25  (!) 37 (!) 23  Temp:      TempSrc:      SpO2: 100% 94% 94%   Weight:  Height:        Intake/Output Summary (Last 24 hours) at 01/28/2017 1530 Last data filed at 01/28/2017 0343 Gross per 24 hour  Intake 3444.5 ml  Output 700 ml  Net 2744.5 ml   Filed Weights   01/24/17 0941    Weight: 46.3 kg (102 lb)    Examination: General appearance: Frail quadruplegic adult female, awake, seems to respond intermittently to questions.     HEENT: Anicteric, conjunctiva pink, lids and lashes normal. No nasal deformity, epistaxis. Mupirocin in nares.  Lips moist.  Trach in place.   Skin: Warm and diaphoretic.  No jaundice.  Mild redness and swelilng on bilateral upper thighs. Cardiac: Tachycardic, regular, no murmurs.   Respiratory: Tachypneic.  Trach with collar and 5L O2.  No wheezes.  Breath sounds coarse bilaterally. Abdomen: Abdomen soft but slightly distended, no grimace to palpation, no masses appreciated.  MSK: Spastic quadruplegia.  Obvious deformity of left hip. Neuro: Awake and seems to be alert to my questions and answering them intentionally, but only able with nods of her head.   Psych: Unable to assess    Data Reviewed: I have personally reviewed following labs and imaging studies:  CBC: Recent Labs  Lab 01/24/17 1011 01/25/17 0147 01/27/17 1456 01/28/17 1340  WBC 14.9* 10.7* 9.9 12.5*  NEUTROABS 12.7*  --   --  9.3*  HGB 12.0 11.7* 10.6* 10.8*  HCT 37.0 35.4* 32.4* 33.1*  MCV 90.7 90.3 90.5 89.9  PLT 248 214 299 998   Basic Metabolic Panel: Recent Labs  Lab 01/24/17 1011 01/25/17 0147 01/27/17 0231 01/28/17 1340  NA 129* 131* 131* 129*  K 4.1 3.3* 4.2 4.4  CL 92* 90* 98* 93*  CO2 26 27 25 27   GLUCOSE 134* 129* 128* 84  BUN 22* 15 10 12   CREATININE 0.40* <0.30* <0.30* <0.30*  CALCIUM 8.9 8.5* 8.2* 8.7*   GFR: CrCl cannot be calculated (This lab value cannot be used to calculate CrCl because it is not a number: <0.30). Liver Function Tests: Recent Labs  Lab 01/24/17 1011 01/27/17 0231  AST 53* 45*  ALT 25 64*  ALKPHOS 113 114  BILITOT 0.9 0.8  PROT 6.2* 5.8*  ALBUMIN 3.1* 2.6*   No results for input(s): LIPASE, AMYLASE in the last 168 hours. No results for input(s): AMMONIA in the last 168 hours. Coagulation Profile: Recent  Labs  Lab 01/24/17 1011  INR 1.08   Cardiac Enzymes: No results for input(s): CKTOTAL, CKMB, CKMBINDEX, TROPONINI in the last 168 hours. BNP (last 3 results) No results for input(s): PROBNP in the last 8760 hours. HbA1C: No results for input(s): HGBA1C in the last 72 hours. CBG: No results for input(s): GLUCAP in the last 168 hours. Lipid Profile: No results for input(s): CHOL, HDL, LDLCALC, TRIG, CHOLHDL, LDLDIRECT in the last 72 hours. Thyroid Function Tests: No results for input(s): TSH, T4TOTAL, FREET4, T3FREE, THYROIDAB in the last 72 hours. Anemia Panel: No results for input(s): VITAMINB12, FOLATE, FERRITIN, TIBC, IRON, RETICCTPCT in the last 72 hours. Urine analysis:    Component Value Date/Time   COLORURINE YELLOW 01/24/2017 1257   APPEARANCEUR HAZY (A) 01/24/2017 1257   LABSPEC 1.017 01/24/2017 1257   PHURINE 6.0 01/24/2017 1257   GLUCOSEU 50 (A) 01/24/2017 1257   HGBUR NEGATIVE 01/24/2017 1257   [place] 01/24/2017 1257   KETONESUR NEGATIVE 01/24/2017 1257   PROTEINUR 30 (A) 01/24/2017 1257   NITRITE NEGATIVE 01/24/2017 1257   LEUKOCYTESUR NEGATIVE 01/24/2017 1257   Sepsis  Labs: @LABRCNTIP (procalcitonin:4,lacticacidven:4)  ) Recent Results (from the past 240 hour(s))  Culture, respiratory (NON-Expectorated)     Status: None   Collection Time: 01/24/17 10:19 AM  Result Value Ref Range Status   Specimen Description TRACHEAL ASPIRATE  Final   Special Requests NONE  Final   Gram Stain   Final    MODERATE WBC PRESENT, PREDOMINANTLY PMN ABUNDANT GRAM NEGATIVE RODS ABUNDANT GRAM POSITIVE COCCI ABUNDANT GRAM POSITIVE RODS    Culture Consistent with normal respiratory flora.  Final   Report Status 01/26/2017 FINAL  Final  Respiratory Panel by PCR     Status: None   Collection Time: 01/24/17 11:40 AM  Result Value Ref Range Status   Adenovirus NOT DETECTED NOT DETECTED Final   Coronavirus 229E NOT DETECTED NOT DETECTED Final   Coronavirus HKU1 NOT  DETECTED NOT DETECTED Final   Coronavirus NL63 NOT DETECTED NOT DETECTED Final   Coronavirus OC43 NOT DETECTED NOT DETECTED Final   Metapneumovirus NOT DETECTED NOT DETECTED Final   Rhinovirus / Enterovirus NOT DETECTED NOT DETECTED Final   Influenza A NOT DETECTED NOT DETECTED Final   Influenza B NOT DETECTED NOT DETECTED Final   Parainfluenza Virus 1 NOT DETECTED NOT DETECTED Final   Parainfluenza Virus 2 NOT DETECTED NOT DETECTED Final   Parainfluenza Virus 3 NOT DETECTED NOT DETECTED Final   Parainfluenza Virus 4 NOT DETECTED NOT DETECTED Final   Respiratory Syncytial Virus NOT DETECTED NOT DETECTED Final   Bordetella pertussis NOT DETECTED NOT DETECTED Final   Chlamydophila pneumoniae NOT DETECTED NOT DETECTED Final   Mycoplasma pneumoniae NOT DETECTED NOT DETECTED Final  Culture, blood (Routine x 2)     Status: Abnormal   Collection Time: 01/24/17 11:45 AM  Result Value Ref Range Status   Specimen Description BLOOD RIGHT FOREARM  Final   Special Requests   Final    BOTTLES DRAWN AEROBIC AND ANAEROBIC Blood Culture adequate volume   Culture  Setup Time   Final    GRAM POSITIVE COCCI IN CLUSTERS AEROBIC BOTTLE ONLY CRITICAL RESULT CALLED TO, READ BACK BY AND VERIFIED WITH: PHARMD B MANCHERIL [id number] MLM    Culture (A)  Final    STAPHYLOCOCCUS SPECIES (COAGULASE NEGATIVE) THE SIGNIFICANCE OF ISOLATING THIS ORGANISM FROM A SINGLE SET OF BLOOD CULTURES WHEN MULTIPLE SETS ARE DRAWN IS UNCERTAIN. PLEASE NOTIFY THE MICROBIOLOGY DEPARTMENT WITHIN ONE WEEK IF SPECIATION AND SENSITIVITIES ARE REQUIRED.    Report Status 01/26/2017 FINAL  Final  Blood Culture ID Panel (Reflexed)     Status: Abnormal   Collection Time: 01/24/17 11:45 AM  Result Value Ref Range Status   Enterococcus species NOT DETECTED NOT DETECTED Final   Listeria monocytogenes NOT DETECTED NOT DETECTED Final   Staphylococcus species DETECTED (A) NOT DETECTED Final    Comment: Methicillin (oxacillin) resistant  coagulase negative staphylococcus. Possible blood culture contaminant (unless isolated from more than one blood culture draw or clinical case suggests pathogenicity). No antibiotic treatment is indicated for blood  culture contaminants. CRITICAL RESULT CALLED TO, READ BACK BY AND VERIFIED WITH: PHARMD B MANCHERIL [id number] MLM    Staphylococcus aureus NOT DETECTED NOT DETECTED Final   Methicillin resistance DETECTED (A) NOT DETECTED Final    Comment: CRITICAL RESULT CALLED TO, READ BACK BY AND VERIFIED WITH: PHARMD B MANCHERIL [id number] MLM    Streptococcus species NOT DETECTED NOT DETECTED Final   Streptococcus agalactiae NOT DETECTED NOT DETECTED Final   Streptococcus pneumoniae NOT DETECTED NOT DETECTED Final   Streptococcus  pyogenes NOT DETECTED NOT DETECTED Final   Acinetobacter baumannii NOT DETECTED NOT DETECTED Final   Enterobacteriaceae species NOT DETECTED NOT DETECTED Final   Enterobacter cloacae complex NOT DETECTED NOT DETECTED Final   Escherichia coli NOT DETECTED NOT DETECTED Final   Klebsiella oxytoca NOT DETECTED NOT DETECTED Final   Klebsiella pneumoniae NOT DETECTED NOT DETECTED Final   Proteus species NOT DETECTED NOT DETECTED Final   Serratia marcescens NOT DETECTED NOT DETECTED Final   Haemophilus influenzae NOT DETECTED NOT DETECTED Final   Neisseria meningitidis NOT DETECTED NOT DETECTED Final   Pseudomonas aeruginosa NOT DETECTED NOT DETECTED Final   Candida albicans NOT DETECTED NOT DETECTED Final   Candida glabrata NOT DETECTED NOT DETECTED Final   Candida krusei NOT DETECTED NOT DETECTED Final   Candida parapsilosis NOT DETECTED NOT DETECTED Final   Candida tropicalis NOT DETECTED NOT DETECTED Final  Culture, blood (Routine x 2)     Status: None (Preliminary result)   Collection Time: 01/24/17 11:53 AM  Result Value Ref Range Status   Specimen Description BLOOD LEFT HAND  Final   Special Requests   Final    BOTTLES DRAWN AEROBIC AND ANAEROBIC Blood  Culture adequate volume   Culture NO GROWTH 3 DAYS  Final   Report Status PENDING  Incomplete  Urine culture     Status: Abnormal   Collection Time: 01/24/17  1:01 PM  Result Value Ref Range Status   Specimen Description URINE, CLEAN CATCH  Final   Special Requests Normal  Final   Culture MULTIPLE SPECIES PRESENT, SUGGEST RECOLLECTION (A)  Final   Report Status 01/25/2017 FINAL  Final  MRSA PCR Screening     Status: Abnormal   Collection Time: 01/26/17 12:47 PM  Result Value Ref Range Status   MRSA by PCR POSITIVE (A) NEGATIVE Final    Comment:        The GeneXpert MRSA Assay (FDA approved for NASAL specimens only), is one component of a comprehensive MRSA colonization surveillance program. It is not intended to diagnose MRSA infection nor to guide or monitor treatment for MRSA infections. RESULT CALLED TO, READ BACK BY AND VERIFIED WITH: Levi Aland RN 14:40 01/26/17 ([email or web address])          Radiology Studies: Dg Abd 1 View  Result Date: 01/27/2017 CLINICAL DATA:  Abdominal distension EXAM: ABDOMEN - 1 VIEW COMPARISON:  01/25/2017 FINDINGS: There is no bowel dilatation to suggest obstruction. There is no evidence of pneumoperitoneum, portal venous gas or pneumatosis. There are no pathologic calcifications along the expected course of the ureters. Comminuted left intertrochanteric fracture. 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 IMPRESSION: 1. No evidence bowel obstruction. 2. Comminuted left intertrochanteric fracture. 3. 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 Electronically Signed   By: Kathreen Devoid   On: 01/27/2017 12:34   Dg Chest Port 1 View  Result Date: 01/28/2017 CLINICAL DATA:  Respiratory failure. EXAM: PORTABLE CHEST 1 VIEW COMPARISON:  01/24/2017 FINDINGS: Cardiomediastinal  silhouette is normal. Mediastinal contours appear intact. Calcific atherosclerotic disease and tortuosity of the aorta. There is no evidence of pleural effusion or pneumothorax. Interval development of right lower lung field airspace opacity. Osseous structures are without acute abnormality. Soft tissues are grossly normal. IMPRESSION: Tracheostomy tube in satisfactory position. Interval development of right lower lung field airspace opacity, which may represent atelectasis or airspace infiltrate. Electronically Signed  By: Fidela Salisbury M.D.   On: 01/28/2017 12:54        Scheduled Meds: . acetaminophen  500 mg Per Tube TID  . albuterol  2.5 mg Inhalation BID  . amantadine  150 mg Per Tube BID  . amLODipine  10 mg Per Tube Daily  . atorvastatin  20 mg Per Tube QHS  . baclofen  5 mg Per Tube QID  . bethanechol  5 mg Per Tube Daily  . bromocriptine  5 mg Oral Daily  . calcium-vitamin D  1 tablet Per Tube Q breakfast  . carbidopa-levodopa  2 tablet Per Tube TID BM  . chlorhexidine  15 mL Mouth/Throat BID  . Chlorhexidine Gluconate Cloth  6 each Topical Q0600  . cholecalciferol  2,000 Units Oral Daily  . feeding supplement (OSMOLITE 1.5 CAL)  1,560 mL Per Tube Daily  . feeding supplement (OSMOLITE 1.5 CAL)  237 mL Per Tube BID  . fluticasone  1 spray Each Nare Daily  . free water  200 mL Per Tube Q6H  . gabapentin  100 mg Per Tube BID  . guaiFENesin  10 mL Per Tube QID  . heparin  5,000 Units Subcutaneous Q8H  . ketoconazole  1 application Topical BID  . loratadine  10 mg Per Tube Daily  . Melatonin  6 mg Per Tube QHS  . multivitamin  15 mL Per Tube Q0600  . mupirocin ointment  1 application Nasal BID  . pantoprazole sodium  40 mg Per Tube Daily  . polyvinyl alcohol  1 drop Both Eyes Daily  . primidone  50 mg Per Tube QID  . saccharomyces boulardii  250 mg Per Tube BID  . senna  1 tablet Oral BID  . valACYclovir  500 mg Per Tube Daily  . vitamin C  500 mg Per Tube Daily    Continuous Infusions: . sodium chloride 50 mL/hr at 01/28/17 0553  . cefTAZidime (FORTAZ)  IV Stopped (01/28/17 0951)  . dextrose 5 % and 0.45% NaCl Stopped (01/26/17 0858)     LOS: 4 days    Time spent: 60 minutes    Edwin Dada, MD [facility] 01/28/2017, 3:30 PM     Pager [phone] --- please page though AMION:  [email or web address] If 7PM-7AM, please contact night-coverage

## 2017-01-28 NOTE — Progress Notes (Signed)
Nutrition Follow-up  DOCUMENTATION CODES:   Not applicable  INTERVENTION:   -Continue Osmolite 1.5 @ 120 ml/hr via PEG over 14 hour period (1700-0700)   -Continue bolus feedings of 237 ml Osmolite 1.5 BID (1000 and 1400) via PEG  -Continue 200 ml free water flush every 6 hours  Tube feeding regimen provides 3231 kcal (>100% of needs), 135 grams of protein, and 2441 ml of H2O.     -Recommend SLP evaluation when appropriate to determine safest diet  NUTRITION DIAGNOSIS:   Increased nutrient needs related to chronic illness as evidenced by estimated needs.  Ongoing  GOAL:   Patient will meet greater than or equal to 90% of their needs  Met with TF  MONITOR:   PO intake, Supplement acceptance, Diet advancement, Labs, Weight trends, Skin, I & O's  REASON FOR ASSESSMENT:   Consult Enteral/tube feeding initiation and management  ASSESSMENT:   Judy Spears is a 67 y.o. 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  Following that hospitalization, she was transferred to [place] for in hospital rehab.  Per family, she was just discharged from there on 01/21/17 and since discharge, had been doing well until morning of 12/31 when she had dyspnea and hypoxia (SpO2 in 80s).  Family attempted chest PT manuevers (which they were coached on during previous admissions), but symptoms did not improve, therefore, she was brought to ED for further eval.  Case discussed with RN prior to visit, who reports pt is tolerating TF regimen well.   Spoke with [name] (Caregiver) and husband at bedside. Both report pt is tolerating TF well with no issues. Pt husband inquired about wt status. Attempted to check wt on bedscale, but unable to obtain wt at this  time due to pt positioning. Spoke with RN, who reports that she will obtain wt when bathing pt later on today. Pt family agreeable to plan.   Reviewed MD note from 01/28/16; pt husband concerned about abdominal distention. Pt underwent KUB on 01/28/16, which revealed no bowel dilatation to suggest obstruction and there is no evidence of pneumoperitoneum, portal venous gas or pneumatosis.  Labs reviewed.   Diet Order:  Diet NPO time specified  EDUCATION NEEDS:   No education needs have been identified at this time  Skin:  Skin Assessment: Reviewed RN Assessment  Last BM:  01/25/17  Height:   Ht Readings from Last 1 Encounters:  01/24/17 '5\' 2"'$  (1.575 m)    Weight:   Wt Readings from Last 1 Encounters:  01/24/17 102 lb (46.3 kg)    Ideal Body Weight:  50 kg  BMI:  Body mass index is 18.66 kg/m.  Estimated Nutritional Needs:   Kcal:  >2000 kcals  Protein:  > 100 grams protein  Fluid:  >2.0 L    Devesh Monforte A. Jimmye Norman, RD, LDN, CDE Pager: [phone] After hours Pager: [phone]

## 2017-01-28 NOTE — Progress Notes (Signed)
[facility] Admission  01/24/2017  HPI: 67 y.o.femalewith a PMHrelevant forchronic respiratory failure s/p tracheostomy, s/p Peg tube andchronic encephalopathy secondary to [place] Virusand history of breast cancer who is brought in by husband due to concerns about persistent hypoxia at home on 01/24/2017.Patient wasrecently admitted11/11/18 through 01/04/17 foracute on chronic respiratory failure,Following that hospitalization, she was transferred to [place] for in hospital rehab. Per family, she was just discharged from there on 01/21/17 and since discharge, had been doing well until morning of 12/31/18when she had dyspnea and hypoxia (SpO2 in 80s). Family attempted chest PT manuevers (which they were coached on during previous admissions), but symptoms did not improve, therefore, she was brought to ED for further eval.  Family felt that she might have had mucous plugging at home. In ED, RT was able to suction several plugs out after saline lavage.  Patient was  seen at bedside 01/25/2017 by  Dr. Lake Bells who is very familiar with patient.The patient has a history of MRSA and Pseudomonas respiratory infections, thereforen coverage for both organisms was initiated 01/25/2017 with vanc and ceftazidime.    .  Subjective: 28% ATC  With increased flow for increased humidity. Last plug noted /02/2017. Low grade fever  O: Vitals:   01/28/17 0343 01/28/17 0727 01/28/17 0736 01/28/17 0814  BP:    (!) 131/50  Pulse: 92  99 99  Resp: (!) 35  (!) 36 (!) 42  Temp: 98.7 F (37.1 C)   98.8 F (37.1 C)  TempSrc: Axillary   Axillary  SpO2: 99% 93% 93% 96%  Weight:      Height:        General:  Resting comfortably in bed, alert, on 5 L O2  ATC. HENT: NCAT trach clean, secure, no drainage PULM: CTA B, normal effort, diminished per bases CV: S1, S2, RRR, no mgr GI: BS+, soft, nontender MSK: deconditioned  Neuro: awake and alert, eye opening to voice, nonpurposeful  Upper  extremity movement.  CBC    Component Value Date/Time   WBC 9.9 01/27/2017 1456   RBC 3.58 (L) 01/27/2017 1456   HGB 10.6 (L) 01/27/2017 1456   HGB 13.1 09/08/2016 1245   HCT 32.4 (L) 01/27/2017 1456   HCT 40.7 09/08/2016 1245   PLT 299 01/27/2017 1456   PLT 363 09/08/2016 1245   MCV 90.5 01/27/2017 1456   MCV 90 09/08/2016 1245   MCH 29.6 01/27/2017 1456   MCHC 32.7 01/27/2017 1456   RDW 15.7 (H) 01/27/2017 1456   RDW 13.5 09/08/2016 1245   LYMPHSABS 0.9 01/24/2017 1011   MONOABS 1.3 (H) 01/24/2017 1011   EOSABS 0.0 01/24/2017 1011   BASOSABS 0.0 01/24/2017 1011   Resp culture >> consistent with normal respiratory flora Resp viral panel negative Blood Cx 12/31>> + Staph species Methicillin Resistant in 1 of 2 cultures, ? Contaminant  WBC down trending  Impression/plan:  Acute respiratory failure with hypoxemia: continue O2 to maintain O2 saturation > 90% T Max 100.1 01/27/2017 Acute tracheobronchitis, history of MRSA:  - antibiotic therapy and monitoring fever and WBC off ABX. -Continue hypertonic saline, chest physiotherapy. >> per family helping. Last plug 01/26/17 -Continue pulmonary toilet,bronchodilators and mucolytics, -Step down status for continued aggressive RT assist, suctioning  and trach management -CXR now -Trach care per unit protocol -ATC with forced medical air for humidity -Titrate oxygen for saturations of >94% -Per family awaiting #4 cuffless trach change per Marni Griffon ( Trach not at bedside, but has been ordered)  Dysphagia -  recommendations per primary team and SLP - Aspiration precautions  I spoke with Dr. Loleta Books re: respiratory aspects of patient care 01/28/2017. Plan to get CXR , CBC now. If improved, recommend  d/c vanc and fortaz and follow fever curve/ WBC. If remains clinically stable and no clinical s/s infection, initiate disposition for care once discharged per primary team.  All other care per primary team.   Magdalen Spatz,  AGACNP-BC [facility] Cell: [phone]

## 2017-01-28 NOTE — Progress Notes (Signed)
Rash noted on patients right upper thigh during bathing, family in room and is concerned because it wasn't there before, primary team made aware, will monitor.

## 2017-01-28 NOTE — Consult Note (Signed)
Orthopedic Consultation   [name]  PTW:[id number]  DOB: Dec 18, 1950  DOA: 01/24/2017  PCP: Marton Redwood, MD   Other Specialists: Internal Medicine, Pulmonology   Requesting physician: Loleta Books, MD   Reason for consultation: Left hip fracture   History of Present Illness: Judy Spears is an 67 y.o. female with chronic encephalopathy who is a non-ambulatory and has a history of prior right subcapital femoral neck fracture that was diagnosed sub-acutely and has been treated without surgery. No history of trauma.  She was actually scheduled to see Dr Hector Shade to discuss options for the right hip but due to illness could not make that appt. She has been dealing with respiratory issues and a recent pneumonia.  Due to low sats she was re-admitted to [facility] for IV abx and respiratory care.  Due to some abdominal distension an abdominal XRAY was done showing a LEFT intertrochanteric fracture and ortho was consulted for discussion of treatment options.  The patient is not able to communicate due to her encephalopathy and it is difficult for her husband to determine if she is in pain. She does appear to grimace some.    Past Medical History: Past Medical History:  Diagnosis Date  . Arthritis    lt great toe  . Complication of anesthesia    pt has had headaches post op-did advise to hydra well preop  . Hair loss    right-sided  . History of exercise stress test    a. ETT (12/15) with 12:00 exercise, no ST changes, occasional PACs.   . Hyperlipidemia   . Insomnia   . Migraine headache   . Movement disorder    resltess in left legs  . Postmenopausal   . 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 Premature atrial contractions    a. Holter (12/15) with 8% PACs, no atrial runs or atrial fibrillation.   Breast CA Chronic Encephalopathy  Past Surgical History: Past Surgical History:  Procedure Laterality Date  . BREAST BIOPSY  01/01/2011   Procedure: BREAST BIOPSY WITH NEEDLE LOCALIZATION;  Surgeon: Rolm Bookbinder, MD;  Location: [place];  Service: General;  Laterality: Left;  left breast wire localization biopsy  . BREAST BIOPSY Right 07/27/2012  . BREAST BIOPSY Right 07/07/2016  . BREAST BIOPSY Left 06/30/2016  . cataracts right eye    . CESAREAN SECTION  1986  . HERNIA REPAIR  2000   RIH  . IR [place] TUBE PERCUT W/FLUORO  09/29/2016  . opirectinal membrane peel    . PEG PLACEMENT    . RHINOPLASTY  1983  . TRACHEOSTOMY    . TRACHEOSTOMY TUBE PLACEMENT N/A 12/15/2016   Procedure: TRACHEOSTOMY CHANGE;  Surgeon: [name] Montane, MD;  Location: [place];  Service: ENT;  Laterality: N/A;     Allergies:   Allergies  Allergen Reactions  . Cefepime Other (See Comments)    Status epilepticus- this reaction is questionable per husband.States that it was determined that pt did not have seizure activity. Status epilepticus  . Tetracyclines & Related Photosensitivity and Other (See Comments)    Blisters (childhood allergy)  . Doxycycline Monohydrate Other (See Comments)    Blisters, photosensitivity- family state that the reaction is actually to tetracycline.  . Pollen Extract Other (See Comments)    Running nose (seasonal allergies)     Social History:  reports that  has never smoked. she has never used  smokeless tobacco. She reports that she drinks alcohol. She reports that she does not use drugs.   Family History: Family History  Problem Relation Age of Onset  . Stroke Mother        paralyzed on right side/aphasia  . Hypertension Mother        had stroke after stopping BP meds  . Colon cancer Father   . Prostate cancer Father   . Heart attack Maternal Grandfather 56  . Stroke Maternal Uncle 50     Physical Exam: Vitals:   01/28/17 1352 01/28/17 1400 01/28/17 1443 01/28/17 1548  BP:  (!) 112/24  (!) 126/98  Pulse:  87 81 86  Resp:  (!) 37 (!) 23 (!) 31  Temp:    99 F (37.2 C)   TempSrc:    Axillary  SpO2: 94% 94%  96%  Weight:      Height:        EXAM:  Patient supine on hospital bed with tracheostomy, nonverbal Right hip and leg extremely stiff. Grimace noted with passive ROM Left LE: some proximal edema present and mild erythema Less swollen distally, good pulses DP, no active ROM noted in the left LE  Data reviewed:  I have personally reviewed following labs and imaging studies Labs:  CBC: Recent Labs  Lab 01/24/17 1011 01/25/17 0147 01/27/17 1456 01/28/17 1340  WBC 14.9* 10.7* 9.9 12.5*  NEUTROABS 12.7*  --   --  9.3*  HGB 12.0 11.7* 10.6* 10.8*  HCT 37.0 35.4* 32.4* 33.1*  MCV 90.7 90.3 90.5 89.9  PLT 248 214 299 678    Basic Metabolic Panel: Recent Labs  Lab 01/24/17 1011 01/25/17 0147 01/27/17 0231 01/28/17 1340  NA 129* 131* 131* 129*  K 4.1 3.3* 4.2 4.4  CL 92* 90* 98* 93*  CO2 26 27 25 27   GLUCOSE 134* 129* 128* 84  BUN 22* 15 10 12   CREATININE 0.40* <0.30* <0.30* <0.30*  CALCIUM 8.9 8.5* 8.2* 8.7*   GFR CrCl cannot be calculated (This lab value cannot be used to calculate CrCl because it is not a number: <0.30). Liver Function Tests: Recent Labs  Lab 01/24/17 1011 01/27/17 0231  AST 53* 45*  ALT 25 64*  ALKPHOS 113 114  BILITOT 0.9 0.8  PROT 6.2* 5.8*  ALBUMIN 3.1* 2.6*   No results for input(s): LIPASE, AMYLASE in the last 168 hours. No results for input(s): AMMONIA in the last 168 hours. Coagulation profile Recent Labs  Lab 01/24/17 1011  INR 1.08    Cardiac Enzymes: No results for input(s): CKTOTAL, CKMB, CKMBINDEX, TROPONINI in the last 168 hours. BNP: Invalid input(s): POCBNP CBG: No results for input(s): GLUCAP in the last 168 hours. D-Dimer No results for input(s): DDIMER in the last 72 hours. Hgb A1c No results for input(s): HGBA1C in the last 72 hours. Lipid Profile No results for input(s): CHOL, HDL, LDLCALC, TRIG, CHOLHDL, LDLDIRECT in the last 72 hours. Thyroid function studies No  results for input(s): TSH, T4TOTAL, T3FREE, THYROIDAB in the last 72 hours.  Invalid input(s): FREET3 Anemia work up No results for input(s): VITAMINB12, FOLATE, FERRITIN, TIBC, IRON, RETICCTPCT in the last 72 hours. Urinalysis    Component Value Date/Time   COLORURINE YELLOW 01/24/2017 1257   APPEARANCEUR HAZY (A) 01/24/2017 1257   LABSPEC 1.017 01/24/2017 1257   PHURINE 6.0 01/24/2017 1257   GLUCOSEU 50 (A) 01/24/2017 1257   HGBUR NEGATIVE 01/24/2017 [place] 01/24/2017 [place] 01/24/2017  [place] (A) 01/24/2017 1257   NITRITE NEGATIVE 01/24/2017 1257   LEUKOCYTESUR NEGATIVE 01/24/2017 1257     Sepsis Labs Invalid input(s): PROCALCITONIN,  WBC,  LACTICIDVEN Microbiology Recent Results (from the past 240 hour(s))  Culture, respiratory (NON-Expectorated)     Status: None   Collection Time: 01/24/17 10:19 AM  Result Value Ref Range Status   Specimen Description TRACHEAL ASPIRATE  Final   Special Requests NONE  Final   Gram Stain   Final    MODERATE WBC PRESENT, PREDOMINANTLY PMN ABUNDANT GRAM NEGATIVE RODS ABUNDANT GRAM POSITIVE COCCI ABUNDANT GRAM POSITIVE RODS    Culture Consistent with normal respiratory flora.  Final   Report Status 01/26/2017 FINAL  Final  Respiratory Panel by PCR     Status: None   Collection Time: 01/24/17 11:40 AM  Result Value Ref Range Status   Adenovirus NOT DETECTED NOT DETECTED Final   Coronavirus 229E NOT DETECTED NOT DETECTED Final   Coronavirus HKU1 NOT DETECTED NOT DETECTED Final   Coronavirus NL63 NOT DETECTED NOT DETECTED Final   Coronavirus OC43 NOT DETECTED NOT DETECTED Final   Metapneumovirus NOT DETECTED NOT DETECTED Final   Rhinovirus / Enterovirus NOT DETECTED NOT DETECTED Final   Influenza A NOT DETECTED NOT DETECTED Final   Influenza B NOT DETECTED NOT DETECTED Final   Parainfluenza Virus 1 NOT DETECTED NOT DETECTED Final   Parainfluenza Virus 2 NOT DETECTED NOT DETECTED  Final   Parainfluenza Virus 3 NOT DETECTED NOT DETECTED Final   Parainfluenza Virus 4 NOT DETECTED NOT DETECTED Final   Respiratory Syncytial Virus NOT DETECTED NOT DETECTED Final   Bordetella pertussis NOT DETECTED NOT DETECTED Final   Chlamydophila pneumoniae NOT DETECTED NOT DETECTED Final   Mycoplasma pneumoniae NOT DETECTED NOT DETECTED Final  Culture, blood (Routine x 2)     Status: Abnormal   Collection Time: 01/24/17 11:45 AM  Result Value Ref Range Status   Specimen Description BLOOD RIGHT FOREARM  Final   Special Requests   Final    BOTTLES DRAWN AEROBIC AND ANAEROBIC Blood Culture adequate volume   Culture  Setup Time   Final    GRAM POSITIVE COCCI IN CLUSTERS AEROBIC BOTTLE ONLY CRITICAL RESULT CALLED TO, READ BACK BY AND VERIFIED WITH: PHARMD B MANCHERIL [phone] MLM    Culture (A)  Final    STAPHYLOCOCCUS SPECIES (COAGULASE NEGATIVE) THE SIGNIFICANCE OF ISOLATING THIS ORGANISM FROM A SINGLE SET OF BLOOD CULTURES WHEN MULTIPLE SETS ARE DRAWN IS UNCERTAIN. PLEASE NOTIFY THE MICROBIOLOGY DEPARTMENT WITHIN ONE WEEK IF SPECIATION AND SENSITIVITIES ARE REQUIRED.    Report Status 01/26/2017 FINAL  Final  Blood Culture ID Panel (Reflexed)     Status: Abnormal   Collection Time: 01/24/17 11:45 AM  Result Value Ref Range Status   Enterococcus species NOT DETECTED NOT DETECTED Final   Listeria monocytogenes NOT DETECTED NOT DETECTED Final   Staphylococcus species DETECTED (A) NOT DETECTED Final    Comment: Methicillin (oxacillin) resistant coagulase negative staphylococcus. Possible blood culture contaminant (unless isolated from more than one blood culture draw or clinical case suggests pathogenicity). No antibiotic treatment is indicated for blood  culture contaminants. CRITICAL RESULT CALLED TO, READ BACK BY AND VERIFIED WITH: PHARMD B MANCHERIL [id number] MLM    Staphylococcus aureus NOT DETECTED NOT DETECTED Final   Methicillin resistance DETECTED (A) NOT DETECTED Final     Comment: CRITICAL RESULT CALLED TO, READ BACK BY AND VERIFIED WITH: [place] [id number] MLM  Streptococcus species NOT DETECTED NOT DETECTED Final   Streptococcus agalactiae NOT DETECTED NOT DETECTED Final   Streptococcus pneumoniae NOT DETECTED NOT DETECTED Final   Streptococcus pyogenes NOT DETECTED NOT DETECTED Final   Acinetobacter baumannii NOT DETECTED NOT DETECTED Final   Enterobacteriaceae species NOT DETECTED NOT DETECTED Final   Enterobacter cloacae complex NOT DETECTED NOT DETECTED Final   Escherichia coli NOT DETECTED NOT DETECTED Final   Klebsiella oxytoca NOT DETECTED NOT DETECTED Final   Klebsiella pneumoniae NOT DETECTED NOT DETECTED Final   Proteus species NOT DETECTED NOT DETECTED Final   Serratia marcescens NOT DETECTED NOT DETECTED Final   Haemophilus influenzae NOT DETECTED NOT DETECTED Final   Neisseria meningitidis NOT DETECTED NOT DETECTED Final   Pseudomonas aeruginosa NOT DETECTED NOT DETECTED Final   Candida albicans NOT DETECTED NOT DETECTED Final   Candida glabrata NOT DETECTED NOT DETECTED Final   Candida krusei NOT DETECTED NOT DETECTED Final   Candida parapsilosis NOT DETECTED NOT DETECTED Final   Candida tropicalis NOT DETECTED NOT DETECTED Final  Culture, blood (Routine x 2)     Status: None (Preliminary result)   Collection Time: 01/24/17 11:53 AM  Result Value Ref Range Status   Specimen Description BLOOD LEFT HAND  Final   Special Requests   Final    BOTTLES DRAWN AEROBIC AND ANAEROBIC Blood Culture adequate volume   Culture NO GROWTH 4 DAYS  Final   Report Status PENDING  Incomplete  Urine culture     Status: Abnormal   Collection Time: 01/24/17  1:01 PM  Result Value Ref Range Status   Specimen Description URINE, CLEAN CATCH  Final   Special Requests Normal  Final   Culture MULTIPLE SPECIES PRESENT, SUGGEST RECOLLECTION (A)  Final   Report Status 01/25/2017 FINAL  Final  MRSA PCR Screening     Status: Abnormal   Collection  Time: 01/26/17 12:47 PM  Result Value Ref Range Status   MRSA by PCR POSITIVE (A) NEGATIVE Final    Comment:        The GeneXpert MRSA Assay (FDA approved for NASAL specimens only), is one component of a comprehensive MRSA colonization surveillance program. It is not intended to diagnose MRSA infection nor to guide or monitor treatment for MRSA infections. RESULT CALLED TO, READ BACK BY AND VERIFIED WITH: H. Deaton RN 14:40 01/26/17 ([email or web address])        Inpatient Medications:   Scheduled Meds: . acetaminophen  500 mg Per Tube TID  . albuterol  2.5 mg Inhalation BID  . amantadine  150 mg Per Tube BID  . amLODipine  10 mg Per Tube Daily  . atorvastatin  20 mg Per Tube QHS  . baclofen  5 mg Per Tube QID  . bethanechol  5 mg Per Tube Daily  . bromocriptine  5 mg Oral Daily  . calcium-vitamin D  1 tablet Per Tube Q breakfast  . carbidopa-levodopa  2 tablet Per Tube TID BM  . chlorhexidine  15 mL Mouth/Throat BID  . Chlorhexidine Gluconate Cloth  6 each Topical Q0600  . cholecalciferol  2,000 Units Oral Daily  . feeding supplement (OSMOLITE 1.5 CAL)  1,560 mL Per Tube Daily  . feeding supplement (OSMOLITE 1.5 CAL)  237 mL Per Tube BID  . fluticasone  1 spray Each Nare Daily  . free water  200 mL Per Tube Q6H  . gabapentin  100 mg Per Tube BID  . guaiFENesin  10 mL Per Tube QID  . heparin  5,000 Units Subcutaneous Q8H  . ketoconazole  1 application Topical BID  . loratadine  10 mg Per Tube Daily  . Melatonin  6 mg Per Tube QHS  . multivitamin  15 mL Per Tube Q0600  . mupirocin ointment  1 application Nasal BID  . pantoprazole sodium  40 mg Per Tube Daily  . polyvinyl alcohol  1 drop Both Eyes Daily  . primidone  50 mg Per Tube QID  . saccharomyces boulardii  250 mg Per Tube BID  . senna  1 tablet Oral BID  . valACYclovir  500 mg Per Tube Daily  . vitamin C  500 mg Per Tube Daily   Continuous Infusions: . sodium chloride 50 mL/hr at 01/28/17 0553  . cefTAZidime (FORTAZ)   IV 1 g (01/28/17 1753)  . dextrose 5 % and 0.45% NaCl Stopped (01/26/17 0858)     Radiological Exams on Admission: Dg Abd 1 View  Result Date: 01/27/2017 CLINICAL DATA:  Abdominal distension EXAM: ABDOMEN - 1 VIEW COMPARISON:  01/25/2017 FINDINGS: There is no bowel dilatation to suggest obstruction. There is no evidence of pneumoperitoneum, portal venous gas or pneumatosis. There are no pathologic calcifications along the expected course of the ureters. Comminuted left intertrochanteric fracture. 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 IMPRESSION: 1. No evidence bowel obstruction. 2. Comminuted left intertrochanteric fracture. 3. 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 Electronically Signed   By: Kathreen Devoid   On: 01/27/2017 12:34   Dg Chest Port 1 View  Result Date: 01/28/2017 CLINICAL DATA:  Respiratory failure. EXAM: PORTABLE CHEST 1 VIEW COMPARISON:  01/24/2017 FINDINGS: Cardiomediastinal silhouette is normal. Mediastinal contours appear intact. Calcific atherosclerotic disease and tortuosity of the aorta. There is no evidence of pleural effusion or pneumothorax. Interval development of right lower lung field airspace opacity. Osseous structures are without acute abnormality. Soft tissues are grossly normal. IMPRESSION: Tracheostomy tube in satisfactory position. Interval development of right lower lung field airspace opacity, which may represent atelectasis or airspace infiltrate. Electronically Signed   By: Fidela Salisbury M.D.   On: 01/28/2017 12:54   XRAY Left HIp:   Impression/Recommendations Left displaced intertrochanteric hip fracture I discussed options for management with the patient's husband who would like to proceed with ORIF.  Dr Alvan Dame (hip specialist) consulted and he is planning on  surgery for Sunday once medically optimized.  NPO after MN Saturday Continue supportive care  Principal Problem:   Acute tracheobronchitis Active Problems:   Encephalitis, arthropod-borne viral   Tracheostomy status (HCC)   Spastic tetraplegia (HCC)   Chronic respiratory failure (HCC)   Tracheobronchitis   PEG (percutaneous endoscopic gastrostomy) status (HCC)   Benign essential HTN   Hyponatremia   Hypokalemia   Chronic mucus hypersecretion, respiratory   Abdominal distension     Thank you for this consultation.     Augustin Schooling M.D. [facility] 01/28/2017, 6:47 PM

## 2017-01-29 LAB — CULTURE, BLOOD (ROUTINE X 2)
CULTURE: NO GROWTH
SPECIAL REQUESTS: ADEQUATE

## 2017-01-29 LAB — COMPREHENSIVE METABOLIC PANEL
ALT: 54 U/L (ref 14–54)
ANION GAP: 9 (ref 5–15)
AST: 43 U/L — ABNORMAL HIGH (ref 15–41)
Albumin: 2.6 g/dL — ABNORMAL LOW (ref 3.5–5.0)
Alkaline Phosphatase: 127 U/L — ABNORMAL HIGH (ref 38–126)
BUN: 13 mg/dL (ref 6–20)
CALCIUM: 8.5 mg/dL — AB (ref 8.9–10.3)
CHLORIDE: 95 mmol/L — AB (ref 101–111)
CO2: 28 mmol/L (ref 22–32)
Glucose, Bld: 140 mg/dL — ABNORMAL HIGH (ref 65–99)
Potassium: 4.1 mmol/L (ref 3.5–5.1)
SODIUM: 132 mmol/L — AB (ref 135–145)
Total Bilirubin: 0.8 mg/dL (ref 0.3–1.2)
Total Protein: 5.8 g/dL — ABNORMAL LOW (ref 6.5–8.1)

## 2017-01-29 LAB — CBC
HEMATOCRIT: 34.4 % — AB (ref 36.0–46.0)
Hemoglobin: 10.8 g/dL — ABNORMAL LOW (ref 12.0–15.0)
MCH: 28.1 pg (ref 26.0–34.0)
MCHC: 31.4 g/dL (ref 30.0–36.0)
MCV: 89.6 fL (ref 78.0–100.0)
Platelets: 355 10*3/uL (ref 150–400)
RBC: 3.84 MIL/uL — ABNORMAL LOW (ref 3.87–5.11)
RDW: 15.6 % — AB (ref 11.5–15.5)
WBC: 9.4 10*3/uL (ref 4.0–10.5)

## 2017-01-29 LAB — MAGNESIUM: MAGNESIUM: 2 mg/dL (ref 1.7–2.4)

## 2017-01-29 MED ORDER — SODIUM CHLORIDE 0.9 % IV SOLN
INTRAVENOUS | Status: DC
  Administered 2017-01-29: 22:00:00 via INTRAVENOUS

## 2017-01-29 MED ORDER — POVIDONE-IODINE 10 % EX SWAB
2.0000 "application " | Freq: Once | CUTANEOUS | Status: AC
  Administered 2017-01-30: 2 via TOPICAL

## 2017-01-29 MED ORDER — CHLORHEXIDINE GLUCONATE 4 % EX LIQD
60.0000 mL | Freq: Once | CUTANEOUS | Status: AC
  Administered 2017-01-30: 4 via TOPICAL
  Filled 2017-01-29: qty 15

## 2017-01-29 MED ORDER — HEPARIN SODIUM (PORCINE) 5000 UNIT/ML IJ SOLN: 5000.0000 [IU] | Freq: Three times a day (TID) | INTRAMUSCULAR | Status: DC

## 2017-01-29 MED ORDER — VANCOMYCIN HCL IN DEXTROSE 1-5 GM/200ML-% IV SOLN
1000.0000 mg | INTRAVENOUS | Status: AC
  Administered 2017-01-30: 1000 mg via INTRAVENOUS
  Filled 2017-01-29: qty 200

## 2017-01-29 NOTE — Progress Notes (Signed)
PROGRESS NOTE    Judy Spears  QQI:[id number] DOB: [birth year] DOA: 01/24/2017 PCP: Marton Redwood, MD      Brief Narrative:  Mrs. 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  In the hospital, lactate 2.09, CXR showed persistent left lower lobe opacity; case was discussed with Pulmonology who recommended vancomycin and ceftazidime and inpatient admission for presumed HCAP.       Assessment & Plan:  Principal Problem:   Acute tracheobronchitis Active Problems:   Encephalitis, arthropod-borne viral   Tracheostomy status (HCC)   Spastic tetraplegia (HCC)   Chronic respiratory failure (HCC)   Tracheobronchitis   PEG (percutaneous endoscopic gastrostomy) status (HCC)   Benign essential HTN   Hyponatremia   Hypokalemia   Chronic mucus hypersecretion, respiratory   Abdominal distension   Health care associated pneumonia vs tracheobronchitis Acute hypoxic respiratory failure See my note from 1/4.  This right base opacity seems to be similar to previous, it is not clear to me or Pulm that we are treating a pneumonia (although we are doing this empirically) rather than a tracheobronchitis.   -Continue ceftazidime 1 more day -Consultation to Pulmonology, appreciate cares  Per my discussion with Pulm re: surgical optimization, they will see today and address some of these questions:  -Need for further antibiotics: no  -Need for intubation post-surgery: not likely  -Need for increased suction post-surgery  -Need for change in cuff size post-surgery      Chronic respiratory failure with tracheostomy Patient currently with #4, with hope to allow Passy-muir.  Although she appears alert enough and mentating well enough to talk, she appears too weak to  actually do so, unfortunately, and there is concern that her smaller cuff is leading to more frequent plugging and difficulty with airway, which might improve with a #6.   -Trach care per Pulmonology -Continue mucolytics, chest PT, bronchodilators  Chronic right hip fracture Acute left hip fracture See note from 1/4.  I spoke with Dr. Veverly Fells from [place] last night, who graciously has evaluated the patient.  They have recommended deferring the chronic fracture and hip replacement to outpatient consideration, but pinning of the new left fracture.  I have discussed pulmonary clearance with Pulm who will see today. With regard to CV clearance, she is low risk.  She has RCRI 0.  She should continue amlodipine peri-operatively. 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  To OR tomorrow.   Consultants:   Pulmonology  Orthopedics  Procedures:   None  Antimicrobials:   Vancomycin 12/31 >>  Ceftazidime 12/31 >>    Subjective: Belly better, less distended, BS positive.  Respiratory status stable overnight.  No worsening of rash per nursing.  No new fever.    Objective: Vitals:   01/29/17 0400 01/29/17 0500 01/29/17 0731 01/29/17 0732  BP: 128/79     Pulse: 87  89  Resp: (!) 38   (!) 40  Temp: 99.3 F (37.4 C)     TempSrc: Axillary     SpO2: 97% (!) 67% 100% 98%  Weight:      Height:        Intake/Output  Summary (Last 24 hours) at 01/29/2017 0819 Last data filed at 01/29/2017 0500 Gross per 24 hour  Intake -  Output 1200 ml  Net -1200 ml   Filed Weights   01/24/17 0941  Weight: 46.3 kg (102 lb)    Examination: General appearance: Frail quadruplegic adult female, awake, seems to respond to questions.     HEENT: Anicteric, conjunctiva pink, lids and lashes normal. No nasal deformity, epistaxis. Mupirocin in nares.  Lips moist.  Trach in place.   Skin: Warm and dry.  No jaundice.    Cardiac: Tachycardic, regular, no murmurs.   Respiratory: Tachypneic.  Trach with collar and 5L O2.  No wheezes.  Breath sounds clear bilaterally. Abdomen: Abdomen soft but slightly distended, no grimace to palpation, no masses appreciated.  MSK: Spastic quadruplegia.  Obvious deformity of left hip. Neuro: Awake and seems to be alert to my questions and answering them intentionally, but only able with nods of her head.   Psych: Unable to assess    Data Reviewed: I have personally reviewed following labs and imaging studies:  CBC: Recent Labs  Lab 01/24/17 1011 01/25/17 0147 01/27/17 1456 01/28/17 1340 01/29/17 0237  WBC 14.9* 10.7* 9.9 12.5* 9.4  NEUTROABS 12.7*  --   --  9.3*  --   HGB 12.0 11.7* 10.6* 10.8* 10.8*  HCT 37.0 35.4* 32.4* 33.1* 34.4*  MCV 90.7 90.3 90.5 89.9 89.6  PLT 248 214 299 321 081   Basic Metabolic Panel: Recent Labs  Lab 01/24/17 1011 01/25/17 0147 01/27/17 0231 01/28/17 1340 01/29/17 0237  NA 129* 131* 131* 129* 132*  K 4.1 3.3* 4.2 4.4 4.1  CL 92* 90* 98* 93* 95*  CO2 26 27 25 27 28   GLUCOSE 134* 129* 128* 84 140*  BUN 22* 15 10 12 13   CREATININE 0.40* <0.30* <0.30* <0.30* <0.30*  CALCIUM 8.9 8.5* 8.2* 8.7* 8.5*  MG  --   --   --   --  2.0   GFR: CrCl cannot be calculated (This lab value cannot be used to calculate CrCl because it is not a number: <0.30). Liver Function Tests: Recent Labs  Lab 01/24/17 1011 01/27/17 0231 01/29/17 0237  AST 53* 45* 43*    ALT 25 64* 54  ALKPHOS 113 114 127*  BILITOT 0.9 0.8 0.8  PROT 6.2* 5.8* 5.8*  ALBUMIN 3.1* 2.6* 2.6*   No results for input(s): LIPASE, AMYLASE in the last 168 hours. No results for input(s): AMMONIA in the last 168 hours. Coagulation Profile: Recent Labs  Lab 01/24/17 1011  INR 1.08   Cardiac Enzymes: No results for input(s): CKTOTAL, CKMB, CKMBINDEX, TROPONINI in the last 168 hours. BNP (last 3 results) No results for input(s): PROBNP in the last 8760 hours. HbA1C: No results for input(s): HGBA1C in the last 72 hours. CBG: No results for input(s): GLUCAP in the last 168 hours. Lipid Profile: No results for input(s): CHOL, HDL, LDLCALC, TRIG, CHOLHDL, LDLDIRECT in the last 72 hours. Thyroid Function Tests: No results for input(s): TSH, T4TOTAL, FREET4, T3FREE, THYROIDAB in the last 72 hours. Anemia Panel: No results for input(s): VITAMINB12, FOLATE, FERRITIN, TIBC, IRON, RETICCTPCT in the last 72 hours. Urine analysis:    Component Value  Date/Time   COLORURINE YELLOW 01/24/2017 1257   APPEARANCEUR HAZY (A) 01/24/2017 1257   LABSPEC 1.017 01/24/2017 1257   PHURINE 6.0 01/24/2017 1257   GLUCOSEU 50 (A) 01/24/2017 1257   HGBUR NEGATIVE 01/24/2017 1257   [place] 01/24/2017 1257   KETONESUR NEGATIVE 01/24/2017 1257   PROTEINUR 30 (A) 01/24/2017 1257   NITRITE NEGATIVE 01/24/2017 1257   LEUKOCYTESUR NEGATIVE 01/24/2017 1257   Sepsis Labs: @LABRCNTIP (procalcitonin:4,lacticacidven:4)  ) Recent Results (from the past 240 hour(s))  Culture, respiratory (NON-Expectorated)     Status: None   Collection Time: 01/24/17 10:19 AM  Result Value Ref Range Status   Specimen Description TRACHEAL ASPIRATE  Final   Special Requests NONE  Final   Gram Stain   Final    MODERATE WBC PRESENT, PREDOMINANTLY PMN ABUNDANT GRAM NEGATIVE RODS ABUNDANT GRAM POSITIVE COCCI ABUNDANT GRAM POSITIVE RODS    Culture Consistent with normal respiratory flora.  Final   Report  Status 01/26/2017 FINAL  Final  Respiratory Panel by PCR     Status: None   Collection Time: 01/24/17 11:40 AM  Result Value Ref Range Status   Adenovirus NOT DETECTED NOT DETECTED Final   Coronavirus 229E NOT DETECTED NOT DETECTED Final   Coronavirus HKU1 NOT DETECTED NOT DETECTED Final   Coronavirus NL63 NOT DETECTED NOT DETECTED Final   Coronavirus OC43 NOT DETECTED NOT DETECTED Final   Metapneumovirus NOT DETECTED NOT DETECTED Final   Rhinovirus / Enterovirus NOT DETECTED NOT DETECTED Final   Influenza A NOT DETECTED NOT DETECTED Final   Influenza B NOT DETECTED NOT DETECTED Final   Parainfluenza Virus 1 NOT DETECTED NOT DETECTED Final   Parainfluenza Virus 2 NOT DETECTED NOT DETECTED Final   Parainfluenza Virus 3 NOT DETECTED NOT DETECTED Final   Parainfluenza Virus 4 NOT DETECTED NOT DETECTED Final   Respiratory Syncytial Virus NOT DETECTED NOT DETECTED Final   Bordetella pertussis NOT DETECTED NOT DETECTED Final   Chlamydophila pneumoniae NOT DETECTED NOT DETECTED Final   Mycoplasma pneumoniae NOT DETECTED NOT DETECTED Final  Culture, blood (Routine x 2)     Status: Abnormal   Collection Time: 01/24/17 11:45 AM  Result Value Ref Range Status   Specimen Description BLOOD RIGHT FOREARM  Final   Special Requests   Final    BOTTLES DRAWN AEROBIC AND ANAEROBIC Blood Culture adequate volume   Culture  Setup Time   Final    GRAM POSITIVE COCCI IN CLUSTERS AEROBIC BOTTLE ONLY CRITICAL RESULT CALLED TO, READ BACK BY AND VERIFIED WITH: PHARMD B MANCHERIL [id number] MLM    Culture (A)  Final    STAPHYLOCOCCUS SPECIES (COAGULASE NEGATIVE) THE SIGNIFICANCE OF ISOLATING THIS ORGANISM FROM A SINGLE SET OF BLOOD CULTURES WHEN MULTIPLE SETS ARE DRAWN IS UNCERTAIN. PLEASE NOTIFY THE MICROBIOLOGY DEPARTMENT WITHIN ONE WEEK IF SPECIATION AND SENSITIVITIES ARE REQUIRED.    Report Status 01/26/2017 FINAL  Final  Blood Culture ID Panel (Reflexed)     Status: Abnormal   Collection Time:  01/24/17 11:45 AM  Result Value Ref Range Status   Enterococcus species NOT DETECTED NOT DETECTED Final   Listeria monocytogenes NOT DETECTED NOT DETECTED Final   Staphylococcus species DETECTED (A) NOT DETECTED Final    Comment: Methicillin (oxacillin) resistant coagulase negative staphylococcus. Possible blood culture contaminant (unless isolated from more than one blood culture draw or clinical case suggests pathogenicity). No antibiotic treatment is indicated for blood  culture contaminants. CRITICAL RESULT CALLED TO, READ BACK BY AND VERIFIED WITH: PHARMD B MANCHERIL  [id number] MLM    Staphylococcus aureus NOT DETECTED NOT DETECTED Final   Methicillin resistance DETECTED (A) NOT DETECTED Final    Comment: CRITICAL RESULT CALLED TO, READ BACK BY AND VERIFIED WITH: PHARMD B MANCHERIL [id number] MLM    Streptococcus species NOT DETECTED NOT DETECTED Final   Streptococcus agalactiae NOT DETECTED NOT DETECTED Final   Streptococcus pneumoniae NOT DETECTED NOT DETECTED Final   Streptococcus pyogenes NOT DETECTED NOT DETECTED Final   Acinetobacter baumannii NOT DETECTED NOT DETECTED Final   Enterobacteriaceae species NOT DETECTED NOT DETECTED Final   Enterobacter cloacae complex NOT DETECTED NOT DETECTED Final   Escherichia coli NOT DETECTED NOT DETECTED Final   Klebsiella oxytoca NOT DETECTED NOT DETECTED Final   Klebsiella pneumoniae NOT DETECTED NOT DETECTED Final   Proteus species NOT DETECTED NOT DETECTED Final   Serratia marcescens NOT DETECTED NOT DETECTED Final   Haemophilus influenzae NOT DETECTED NOT DETECTED Final   Neisseria meningitidis NOT DETECTED NOT DETECTED Final   Pseudomonas aeruginosa NOT DETECTED NOT DETECTED Final   Candida albicans NOT DETECTED NOT DETECTED Final   Candida glabrata NOT DETECTED NOT DETECTED Final   Candida krusei NOT DETECTED NOT DETECTED Final   Candida parapsilosis NOT DETECTED NOT DETECTED Final   Candida tropicalis NOT DETECTED NOT  DETECTED Final  Culture, blood (Routine x 2)     Status: None (Preliminary result)   Collection Time: 01/24/17 11:53 AM  Result Value Ref Range Status   Specimen Description BLOOD LEFT HAND  Final   Special Requests   Final    BOTTLES DRAWN AEROBIC AND ANAEROBIC Blood Culture adequate volume   Culture NO GROWTH 4 DAYS  Final   Report Status PENDING  Incomplete  Urine culture     Status: Abnormal   Collection Time: 01/24/17  1:01 PM  Result Value Ref Range Status   Specimen Description URINE, CLEAN CATCH  Final   Special Requests Normal  Final   Culture MULTIPLE SPECIES PRESENT, SUGGEST RECOLLECTION (A)  Final   Report Status 01/25/2017 FINAL  Final  MRSA PCR Screening     Status: Abnormal   Collection Time: 01/26/17 12:47 PM  Result Value Ref Range Status   MRSA by PCR POSITIVE (A) NEGATIVE Final    Comment:        The GeneXpert MRSA Assay (FDA approved for NASAL specimens only), is one component of a comprehensive MRSA colonization surveillance program. It is not intended to diagnose MRSA infection nor to guide or monitor treatment for MRSA infections. RESULT CALLED TO, READ BACK BY AND VERIFIED WITH: Levi Aland RN 14:40 01/26/17 ([email or web address])          Radiology Studies: Dg Abd 1 View  Result Date: 01/27/2017 CLINICAL DATA:  Abdominal distension EXAM: ABDOMEN - 1 VIEW COMPARISON:  01/25/2017 FINDINGS: There is no bowel dilatation to suggest obstruction. There is no evidence of pneumoperitoneum, portal venous gas or pneumatosis. There are no pathologic calcifications along the expected course of the ureters. Comminuted left intertrochanteric fracture. 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 IMPRESSION: 1. No evidence bowel obstruction. 2. Comminuted left intertrochanteric fracture. 3. 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 Electronically Signed   By: Kathreen Devoid   On: 01/27/2017 12:34   Dg Chest Port 1 View  Result Date: 01/28/2017 CLINICAL DATA:  Respiratory failure. EXAM: PORTABLE CHEST 1 VIEW  COMPARISON:  01/24/2017 FINDINGS: Cardiomediastinal silhouette is normal. Mediastinal contours appear intact. Calcific atherosclerotic disease and tortuosity of the aorta. There is no evidence of pleural effusion or pneumothorax. Interval development of right lower lung field airspace opacity. Osseous structures are without acute abnormality. Soft tissues are grossly normal. IMPRESSION: Tracheostomy tube in satisfactory position. Interval development of right lower lung field airspace opacity, which may represent atelectasis or airspace infiltrate. Electronically Signed   By: Fidela Salisbury M.D.   On: 01/28/2017 12:54   Dg Hip Port Unilat With Pelvis 1v Left  Result Date: 01/28/2017 CLINICAL DATA:  Hip fracture EXAM: DG HIP (WITH OR WITHOUT PELVIS) 1V PORT LEFT COMPARISON:  12/28/2015 FINDINGS: A single portable view of the left hip was obtained. This documents a comminuted intertrochanteric femoral neck fracture with varus angulation. IMPRESSION: Comminuted intertrochanteric femoral neck fracture. Electronically Signed   By: Misty Stanley M.D.   On: 01/28/2017 21:01        Scheduled Meds: . acetaminophen  500 mg Per Tube TID  . albuterol  2.5 mg Inhalation BID  . amantadine  150 mg Per Tube BID  . amLODipine  10 mg Per Tube Daily  . atorvastatin  20 mg Per Tube QHS  . baclofen  5 mg Per Tube QID  . bethanechol  5 mg Per Tube Daily  . bromocriptine  5 mg Oral Daily  . calcium-vitamin D  1 tablet Per Tube Q breakfast  . carbidopa-levodopa  2 tablet Per Tube TID BM  . chlorhexidine  15 mL Mouth/Throat BID  . Chlorhexidine Gluconate Cloth  6 each Topical Q0600  . cholecalciferol  2,000 Units Oral Daily  . feeding supplement (OSMOLITE 1.5 CAL)  1,560 mL Per Tube Daily  .  feeding supplement (OSMOLITE 1.5 CAL)  237 mL Per Tube BID  . fluticasone  1 spray Each Nare Daily  . free water  200 mL Per Tube Q6H  . gabapentin  100 mg Per Tube BID  . guaiFENesin  10 mL Per Tube QID  . heparin  5,000 Units Subcutaneous Q8H  . ketoconazole  1 application Topical BID  . loratadine  10 mg Per Tube Daily  . Melatonin  6 mg Per Tube QHS  . multivitamin  15 mL Per Tube Q0600  . mupirocin ointment  1 application Nasal BID  . pantoprazole sodium  40 mg Per Tube Daily  . polyvinyl alcohol  1 drop Both Eyes Daily  . primidone  50 mg Per Tube QID  . saccharomyces boulardii  250 mg Per Tube BID  . senna  1 tablet Oral BID  . valACYclovir  500 mg Per Tube Daily  . vitamin C  500 mg Per Tube Daily   Continuous Infusions: . sodium chloride 50 mL/hr at 01/28/17 0553  . cefTAZidime (FORTAZ)  IV Stopped (01/29/17 0706)  . dextrose 5 % and 0.45% NaCl Stopped (01/26/17 0858)     LOS: 5 days    Time spent: 60 minutes    Edwin Dada, MD [facility] 01/29/2017, 8:19 AM     Pager [phone] --- please page though AMION:  [email or web address] If 7PM-7AM, please contact night-coverage

## 2017-01-29 NOTE — Anesthesia Preprocedure Evaluation (Addendum)
Anesthesia Evaluation  Patient identified by MRN, date of birth, ID band Patient awake    Reviewed: Allergy & Precautions, NPO status , Patient's Chart, lab work & pertinent test results  History of Anesthesia Complications Negative for: history of anesthetic complications  Airway Mallampati: Trach       Dental  (+) Teeth Intact   Pulmonary shortness of breath, pneumonia (recent RLL infiltrate), resolved,  Chronic resp failure s/p encephalitis: trach Recent tracheobronchitis with pneumonia   breath sounds clear to auscultation       Cardiovascular hypertension, Pt. 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, unclear how much pt understands    GI/Hepatic Neg liver ROS, PEG tube   Endo/Other  negative endocrine ROS  Renal/GU negative Renal ROS     Musculoskeletal  (+) Arthritis ,   Abdominal   Peds  Hematology Hb 10.8   Anesthesia Other Findings   Reproductive/Obstetrics                            Lab Results  Component Value Date   WBC 9.4 01/29/2017   HGB 10.8 (L) 01/29/2017   HCT 34.4 (L) 01/29/2017   MCV 89.6 01/29/2017   PLT 355 01/29/2017   Lab Results  Component Value Date   CREATININE <0.30 (L) 01/29/2017   BUN 13 01/29/2017   NA 132 (L) 01/29/2017   K 4.1 01/29/2017   CL 95 (L) 01/29/2017   CO2 28 01/29/2017    Anesthesia Physical Anesthesia Plan  ASA: III  Anesthesia Plan: General   Post-op Pain Management:    Induction: Intravenous  PONV Risk Score and Plan: 3 and Ondansetron, Dexamethasone and Treatment may vary due to age or medical condition  Airway Management Planned: Tracheostomy  Additional Equipment:   Intra-op Plan:   Post-operative Plan: Extubation in  OR  Informed Consent: I have reviewed the patients History and Physical, chart, labs and discussed the procedure including the risks, benefits and alternatives for the proposed anesthesia with the patient or authorized representative who has indicated his/her understanding and acceptance.     Plan Discussed with: Surgeon  Anesthesia Plan Comments: (Plan on TIVA with ETT exchanged through trach. Plan 2nd IV, T&S, routine monitors.)       Anesthesia Quick Evaluation

## 2017-01-29 NOTE — Progress Notes (Signed)
Patient ID: Judy Spears, female   DOB: [birth year], 67 y.o.   MRN: [id number]  Left peritrochanteric femur fracture  NPO after midnight for planned ORIF left hip tomorrow am Discussed and reviewed with her husband after preliminary conversation with Veverly Fells  Consent to be obtained, ordered

## 2017-01-30 ENCOUNTER — Inpatient Hospital Stay (HOSPITAL_COMMUNITY): Payer: Medicare Other | Admitting: Anesthesiology

## 2017-01-30 ENCOUNTER — Encounter (HOSPITAL_COMMUNITY)
Admission: EM | Disposition: A | Discharge status: Home Health | Payer: Self-pay | Source: Home / Self Care | Attending: Family Medicine

## 2017-01-30 ENCOUNTER — Encounter (HOSPITAL_COMMUNITY): Payer: Self-pay | Admitting: Anesthesiology

## 2017-01-30 ENCOUNTER — Inpatient Hospital Stay (HOSPITAL_COMMUNITY): Payer: Medicare Other

## 2017-01-30 DIAGNOSIS — Z01811 Encounter for preprocedural respiratory examination: Secondary | ICD-10-CM

## 2017-01-30 HISTORY — PX: INTRAMEDULLARY (IM) NAIL INTERTROCHANTERIC: SHX5875

## 2017-01-30 LAB — CREATININE, SERUM
Creatinine, Ser: 0.3 mg/dL — ABNORMAL LOW (ref 0.44–1.00)
GFR calc Af Amer: >60 mL/min (ref >60)
GFR calc non Af Amer: >60 mL/min (ref >60)

## 2017-01-30 LAB — CBC
HEMATOCRIT: 36.3 % (ref 36.0–46.0)
HEMATOCRIT: 35.6 % — AB (ref 36.0–46.0)
Hemoglobin: 12.2 g/dL (ref 12.0–15.0)
Hemoglobin: 11.3 g/dL — ABNORMAL LOW (ref 12.0–15.0)
MCH: 30.2 pg (ref 26.0–34.0)
MCH: 28.7 pg (ref 26.0–34.0)
MCHC: 33.6 g/dL (ref 30.0–36.0)
MCHC: 31.7 g/dL (ref 30.0–36.0)
MCV: 89.9 fL (ref 78.0–100.0)
MCV: 90.4 fL (ref 78.0–100.0)
Platelets: 474 10*3/uL — ABNORMAL HIGH (ref 150–400)
Platelets: 428 10*3/uL — ABNORMAL HIGH (ref 150–400)
RBC: 4.04 MIL/uL (ref 3.87–5.11)
RBC: 3.94 MIL/uL (ref 3.87–5.11)
RDW: 15.8 % — AB (ref 11.5–15.5)
RDW: 15.3 % (ref 11.5–15.5)
WBC: 12.4 10*3/uL — ABNORMAL HIGH (ref 4.0–10.5)
WBC: 13.2 10*3/uL — ABNORMAL HIGH (ref 4.0–10.5)

## 2017-01-30 LAB — BASIC METABOLIC PANEL
ANION GAP: 14 (ref 5–15)
BUN: 11 mg/dL (ref 6–20)
CALCIUM: 9.2 mg/dL (ref 8.9–10.3)
CO2: 25 mmol/L (ref 22–32)
Chloride: 87 mmol/L — ABNORMAL LOW (ref 101–111)
Creatinine, Ser: <0.3 mg/dL — ABNORMAL LOW (ref 0.44–1.00)
Glucose, Bld: 95 mg/dL (ref 65–99)
Potassium: 4 mmol/L (ref 3.5–5.1)
Sodium: 126 mmol/L — ABNORMAL LOW (ref 135–145)

## 2017-01-30 LAB — ABO/RH: ABO/RH(D): O NEG

## 2017-01-30 LAB — PROTIME-INR
INR: 1.07
Prothrombin Time: 13.9 seconds (ref 11.4–15.2)

## 2017-01-30 LAB — SURGICAL PCR SCREEN
MRSA, PCR: POSITIVE — AB
STAPHYLOCOCCUS AUREUS: POSITIVE — AB

## 2017-01-30 LAB — TYPE AND SCREEN
ABO/RH(D): O NEG
ANTIBODY SCREEN: NEGATIVE

## 2017-01-30 LAB — GLUCOSE, CAPILLARY: GLUCOSE-CAPILLARY: 125 mg/dL — AB (ref 65–99)

## 2017-01-30 SURGERY — FIXATION, FRACTURE, INTERTROCHANTERIC, WITH INTRAMEDULLARY ROD
Anesthesia: General | Site: Leg Lower | Laterality: Left

## 2017-01-30 MED ORDER — VANCOMYCIN HCL 1000 MG IV SOLR
1000.0000 mg | Freq: Two times a day (BID) | INTRAVENOUS | Status: DC
  Administered 2017-01-30: 1000 mg via INTRAVENOUS
  Filled 2017-01-30: qty 1000

## 2017-01-30 MED ORDER — PHENYLEPHRINE HCL 10 MG/ML IJ SOLN
INTRAMUSCULAR | Status: DC | PRN
  Administered 2017-01-30 (×2): 80 ug via INTRAVENOUS

## 2017-01-30 MED ORDER — PHENYLEPHRINE HCL 10 MG/ML IJ SOLN
INTRAVENOUS | Status: DC | PRN
  Administered 2017-01-30: 25 ug/min via INTRAVENOUS

## 2017-01-30 MED ORDER — LIDOCAINE 2% (20 MG/ML) 5 ML SYRINGE
INTRAMUSCULAR | Status: AC
  Filled 2017-01-30: qty 5

## 2017-01-30 MED ORDER — BETHANECHOL 1 MG/ML PEDIATRIC ORAL SUSPENSION: 5.0000 mg | Freq: Every day | ORAL | Status: DC

## 2017-01-30 MED ORDER — ONDANSETRON HCL 4 MG/2ML IJ SOLN
INTRAMUSCULAR | Status: DC | PRN
  Administered 2017-01-30: 4 mg via INTRAVENOUS

## 2017-01-30 MED ORDER — SODIUM CHLORIDE 0.9 % IV SOLN
0.0125 ug/kg/min | INTRAVENOUS | Status: DC
  Filled 2017-01-30: qty 1000

## 2017-01-30 MED ORDER — FENTANYL CITRATE (PF) 100 MCG/2ML IJ SOLN
INTRAMUSCULAR | Status: AC
  Administered 2017-01-30: 50 ug via INTRAVENOUS
  Filled 2017-01-30: qty 2

## 2017-01-30 MED ORDER — 0.9 % SODIUM CHLORIDE (POUR BTL) OPTIME
TOPICAL | Status: DC | PRN
  Administered 2017-01-30: 1000 mL

## 2017-01-30 MED ORDER — LACTATED RINGERS IV SOLN
INTRAVENOUS | Status: DC | PRN
  Administered 2017-01-30: 09:00:00 via INTRAVENOUS

## 2017-01-30 MED ORDER — ONDANSETRON HCL 4 MG/2ML IJ SOLN
INTRAMUSCULAR | Status: AC
  Filled 2017-01-30: qty 2

## 2017-01-30 MED ORDER — METOCLOPRAMIDE HCL 10 MG PO TABS: 5.0000 mg | ORAL_TABLET | Freq: Three times a day (TID) | ORAL | Status: DC | PRN

## 2017-01-30 MED ORDER — ENOXAPARIN SODIUM 40 MG/0.4ML ~~LOC~~ SOLN
40.0000 mg | SUBCUTANEOUS | Status: DC
  Administered 2017-01-31 – 2017-02-02 (×3): 40 mg via SUBCUTANEOUS
  Filled 2017-01-30 (×3): qty 0.4

## 2017-01-30 MED ORDER — METOCLOPRAMIDE HCL 5 MG/ML IJ SOLN: 5.0000 mg | Freq: Three times a day (TID) | INTRAMUSCULAR | Status: DC | PRN

## 2017-01-30 MED ORDER — PROPOFOL 10 MG/ML IV BOLUS
INTRAVENOUS | Status: DC | PRN
  Administered 2017-01-30: 50 mg via INTRAVENOUS
  Administered 2017-01-30: 100 mg via INTRAVENOUS
  Administered 2017-01-30: 20 mg via INTRAVENOUS
  Administered 2017-01-30: 30 mg via INTRAVENOUS

## 2017-01-30 MED ORDER — SODIUM CHLORIDE 0.9 % IV SOLN
0.0125 ug/kg/min | INTRAVENOUS | Status: DC
  Administered 2017-01-30: .2 ug/kg/min via INTRAVENOUS
  Filled 2017-01-30 (×2): qty 2000

## 2017-01-30 MED ORDER — PROPOFOL 500 MG/50ML IV EMUL
INTRAVENOUS | Status: DC | PRN
Start: 2017-01-30 — End: 2017-01-30
  Administered 2017-01-30: 100 ug/kg/min via INTRAVENOUS

## 2017-01-30 MED ORDER — ACETAMINOPHEN 650 MG RE SUPP: 650.0000 mg | Freq: Four times a day (QID) | RECTAL | Status: DC | PRN

## 2017-01-30 MED ORDER — PHENOL 1.4 % MT LIQD: 1.0000 | OROMUCOSAL | Status: DC | PRN

## 2017-01-30 MED ORDER — PROPOFOL 10 MG/ML IV BOLUS
INTRAVENOUS | Status: AC
  Filled 2017-01-30: qty 20

## 2017-01-30 MED ORDER — HYDROMORPHONE HCL 1 MG/ML IJ SOLN
0.5000 mg | INTRAMUSCULAR | Status: DC | PRN
  Administered 2017-01-31: 0.5 mg via INTRAVENOUS
  Filled 2017-01-30: qty 0.5
  Filled 2017-01-30: qty 1

## 2017-01-30 MED ORDER — MENTHOL 3 MG MT LOZG: 1.0000 | LOZENGE | OROMUCOSAL | Status: DC | PRN

## 2017-01-30 MED ORDER — ONDANSETRON HCL 4 MG PO TABS: 4.0000 mg | ORAL_TABLET | Freq: Four times a day (QID) | ORAL | Status: DC | PRN

## 2017-01-30 MED ORDER — ACETAMINOPHEN 325 MG PO TABS: 650.0000 mg | ORAL_TABLET | Freq: Four times a day (QID) | ORAL | Status: DC | PRN

## 2017-01-30 MED ORDER — FENTANYL CITRATE (PF) 250 MCG/5ML IJ SOLN
INTRAMUSCULAR | Status: AC
  Filled 2017-01-30: qty 5

## 2017-01-30 MED ORDER — LACTATED RINGERS IV SOLN
INTRAVENOUS | Status: DC
  Administered 2017-01-30: 08:00:00 via INTRAVENOUS

## 2017-01-30 MED ORDER — FENTANYL CITRATE (PF) 100 MCG/2ML IJ SOLN
25.0000 ug | INTRAMUSCULAR | Status: DC | PRN
  Administered 2017-01-30 (×2): 50 ug via INTRAVENOUS

## 2017-01-30 MED ORDER — MIDAZOLAM HCL 2 MG/2ML IJ SOLN
INTRAMUSCULAR | Status: AC
  Filled 2017-01-30: qty 2

## 2017-01-30 MED ORDER — LIDOCAINE HCL (CARDIAC) 20 MG/ML IV SOLN
INTRAVENOUS | Status: DC | PRN
  Administered 2017-01-30: 60 mg via INTRAVENOUS

## 2017-01-30 MED ORDER — SODIUM CHLORIDE 0.9 % IV SOLN
INTRAVENOUS | Status: DC
  Administered 2017-01-30: 13:00:00 via INTRAVENOUS

## 2017-01-30 MED ORDER — ONDANSETRON HCL 4 MG/2ML IJ SOLN: 4.0000 mg | Freq: Four times a day (QID) | INTRAMUSCULAR | Status: DC | PRN

## 2017-01-30 MED ORDER — DEXAMETHASONE SODIUM PHOSPHATE 10 MG/ML IJ SOLN
INTRAMUSCULAR | Status: DC | PRN
  Administered 2017-01-30: 10 mg via INTRAVENOUS

## 2017-01-30 MED ORDER — DEXAMETHASONE SODIUM PHOSPHATE 10 MG/ML IJ SOLN
INTRAMUSCULAR | Status: AC
  Filled 2017-01-30: qty 1

## 2017-01-30 SURGICAL SUPPLY — 37 items
ADH SKN CLS APL DERMABOND .7 (GAUZE/BANDAGES/DRESSINGS) ×1
BIT DRILL CANN LG 4.3MM (BIT) IMPLANT
COVER PERINEAL POST (MISCELLANEOUS) ×3 IMPLANT
COVER SURGICAL LIGHT HANDLE (MISCELLANEOUS) ×3 IMPLANT
DERMABOND ADVANCED (GAUZE/BANDAGES/DRESSINGS) ×2
DERMABOND ADVANCED .7 DNX12 (GAUZE/BANDAGES/DRESSINGS) ×1 IMPLANT
DRAPE STERI IOBAN 125X83 (DRAPES) ×3 IMPLANT
DRILL BIT CANN LG 4.3MM (BIT) ×3
DRSG AQUACEL AG ADV 3.5X 6 (GAUZE/BANDAGES/DRESSINGS) ×2 IMPLANT
DRSG AQUACEL AG ADV 3.5X10 (GAUZE/BANDAGES/DRESSINGS) ×2 IMPLANT
DRSG MEPILEX BORDER 4X12 (GAUZE/BANDAGES/DRESSINGS) ×6 IMPLANT
DURAPREP 26ML APPLICATOR (WOUND CARE) ×3 IMPLANT
ELECT REM PT RETURN 9FT ADLT (ELECTROSURGICAL) ×3
ELECTRODE REM PT RTRN 9FT ADLT (ELECTROSURGICAL) ×1 IMPLANT
FACESHIELD WRAPAROUND (MASK) ×6 IMPLANT
FACESHIELD WRAPAROUND OR TEAM (MASK) ×2 IMPLANT
GOWN STRL REUS W/ TWL LRG LVL3 (GOWN DISPOSABLE) ×2 IMPLANT
GOWN STRL REUS W/TWL 2XL LVL3 (GOWN DISPOSABLE) ×3 IMPLANT
GOWN STRL REUS W/TWL LRG LVL3 (GOWN DISPOSABLE) ×6
GUIDEPIN 3.2X17.5 THRD DISP (PIN) ×2 IMPLANT
HFN 125 DEG 11MM X 180MM (Orthopedic Implant) ×2 IMPLANT
KIT ROOM TURNOVER OR (KITS) ×3 IMPLANT
LINER BOOT UNIVERSAL DISP (MISCELLANEOUS) ×3 IMPLANT
MANIFOLD NEPTUNE II (INSTRUMENTS) ×3 IMPLANT
NS IRRIG 1000ML POUR BTL (IV SOLUTION) ×3 IMPLANT
PACK GENERAL/GYN (CUSTOM PROCEDURE TRAY) ×3 IMPLANT
PAD ARMBOARD 7.5X6 YLW CONV (MISCELLANEOUS) ×6 IMPLANT
SCREW BONE CORTICAL 5.0X3 (Screw) ×2 IMPLANT
SCREW LAG 10.5MMX105MM HFN (Screw) ×2 IMPLANT
SUT MNCRL AB 4-0 PS2 18 (SUTURE) ×3 IMPLANT
SUT VIC AB 1 CT1 27 (SUTURE) ×3
SUT VIC AB 1 CT1 27XBRD ANBCTR (SUTURE) ×1 IMPLANT
SUT VIC AB 2-0 CT1 27 (SUTURE) ×3
SUT VIC AB 2-0 CT1 TAPERPNT 27 (SUTURE) ×1 IMPLANT
TOWEL OR 17X24 6PK STRL BLUE (TOWEL DISPOSABLE) ×3 IMPLANT
TOWEL OR 17X26 10 PK STRL BLUE (TOWEL DISPOSABLE) ×3 IMPLANT
WATER STERILE IRR 1000ML POUR (IV SOLUTION) ×3 IMPLANT

## 2017-01-30 NOTE — OR Nursing (Signed)
Noted pt abdomen distended, previous KUB done with no bowel obstruction, Bladder scan done and 370ml noted in bladder. Anesthesia aware, no new orders for in and out cath. Report given to nurse on unit that pt is being transported to. VS stable. Pt husband was sent back to room after transporting pt.

## 2017-01-30 NOTE — Progress Notes (Signed)
Patient ID: Judy Spears [name], female   DOB: [birth year], 67 y.o.   MRN: [id number]  Status unchanged  NPO To OR today for ORIF of her left hip Consent signed by her husband

## 2017-01-30 NOTE — Progress Notes (Signed)
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  In the hospital, lactate 2.09, CXR showed persistent left lower lobe opacity; case was discussed with Pulmonology who recommended vancomycin and ceftazidime and inpatient admission for presumed HCAP.       Assessment & Plan:  Principal Problem:   Acute tracheobronchitis Active Problems:   Encephalitis, arthropod-borne viral   Tracheostomy status (HCC)   Spastic tetraplegia (HCC)   Chronic respiratory failure (HCC)   Tracheobronchitis   PEG (percutaneous endoscopic gastrostomy) status (HCC)   Benign essential HTN   Hyponatremia   Hypokalemia   Chronic mucus hypersecretion, respiratory   Abdominal distension   Chronic right hip fracture Acute left hip fracture -Continue oxycodone 2.5-5 mg as needed -Continue scheduled acetaminophen -Consult to Orthopedics -PT per Ortho -Family have preference to go home with HHPT; other nursing services they already have in place   Health care associated pneumonia vs tracheobronchitis Acute hypoxic respiratory failure Treatment with ceftazidime 7 days, complete. 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  If stable, plan for discharge in 2-3 days.   Consultants:   Pulmonology  Orthopedics  Procedures:   None  Antimicrobials:   Vancomycin 12/31 >> 1/4  Ceftazidime 12/31 >> 1/6   Subjective: To OR this morning. Muscle spasm was an obstacle, but uneventful procedure, low blood loss.  Belly a little distended again post-op. ?330 PVR.  Will monitor.   Respiratory status stable, suctioning without much return at last nursing report.      Objective: Vitals:   01/30/17 1142 01/30/17 1145 01/30/17 1157 01/30/17 1206  BP:  133/83 131/78 (!) 140/92  Pulse: 79 83 81   Resp: 18 18 18    Temp:      TempSrc:      SpO2: 100% 100% 99%   Weight:      Height:        Intake/Output Summary (Last 24 hours) at 01/30/2017 1419 Last data filed at 01/30/2017 1102 Gross per 24 hour  Intake 2190 ml  Output 450 ml  Net 1740 ml   Filed Weights   01/24/17 0941  Weight: 46.3 kg (102 lb)    Examination: General appearance: Frail quadruplegic adult female, awake, nods in response to husband asking about going home.     HEENT: Anicteric, conjunctiva pink, lids and lashes normal. No nasal deformity, epistaxis.   Lips moist.  Trach in place.   Skin: Warm and moist.  No jaundice.   Left hip looks good. Cardiac: Tachycardic, regular, no murmurs.   Respiratory: Tachypneic.  Trach with collar and 28% O2.  No wheezes.  Coarse respiratory sounds bilaterally. Abdomen: Abdomen soft but slightly distended again, no grimace to palpation, no masses appreciated.  MSK: Spastic quadruplegia.    Neuro: Awake and seems to be alert to my questions and answering them intentionally, but only able with nods of her head.   Psych: Unable to assess    Data Reviewed: I have personally reviewed following labs and imaging studies:  CBC: Recent Labs   Lab 02/17/17 1011  01/27/17 1456 01/28/17 1340 01/29/17 0237 01/30/17 0733 01/30/17 1338  WBC 14.9*   < > 9.9 12.5* 9.4 12.4* 13.2*  NEUTROABS 12.7*  --   --  9.3*  --   --   --   HGB 12.0   < > 10.6* 10.8* 10.8* 12.2 11.3*  HCT 37.0   < > 32.4* 33.1* 34.4* 36.3 35.6*  MCV 90.7   < > 90.5 89.9 89.6 89.9 90.4  PLT 248   < > 299 321 355 474* 428*   < > = values in this interval not displayed.   Basic Metabolic Panel: Recent Labs  Lab 01/25/17 0147 01/27/17 0231 01/28/17 1340 01/29/17 0237 01/30/17 0733  NA 131* 131* 129* 132* 126*  K 3.3* 4.2 4.4 4.1 4.0  CL 90* 98* 93* 95* 87*  CO2 27 25 27 28 25   GLUCOSE 129* 128* 84 140* 95  BUN 15 10 12 13 11   CREATININE <0.30* <0.30* <0.30* <0.30* <0.30*  CALCIUM 8.5* 8.2* 8.7* 8.5* 9.2  MG  --   --   --  2.0  --    GFR: CrCl cannot be calculated (This lab value cannot be used to calculate CrCl because it is not a number: <0.30). Liver Function Tests: Recent Labs  Lab 02-17-2017 1011 01/27/17 0231 01/29/17 0237  AST 53* 45* 43*  ALT 25 64* 54  ALKPHOS 113 114 127*  BILITOT 0.9 0.8 0.8  PROT 6.2* 5.8* 5.8*  ALBUMIN 3.1* 2.6* 2.6*   No results for input(s): LIPASE, AMYLASE in the last 168 hours. No results for input(s): AMMONIA in the last 168 hours. Coagulation Profile: Recent Labs  Lab 02-17-17 1011 01/30/17 0733  INR 1.08 1.07   Cardiac Enzymes: No results for input(s): CKTOTAL, CKMB, CKMBINDEX, TROPONINI in the last 168 hours. BNP (last 3 results) No results for input(s): PROBNP in the last 8760 hours. HbA1C: No results for input(s): HGBA1C in the last 72 hours. CBG: Recent Labs  Lab 01/30/17 1059  GLUCAP 125*   Lipid Profile: No results for input(s): CHOL, HDL, LDLCALC, TRIG, CHOLHDL, LDLDIRECT in the last 72 hours. Thyroid Function Tests: No results for input(s): TSH, T4TOTAL, FREET4, T3FREE, THYROIDAB in the last 72 hours. Anemia Panel: No results for input(s): VITAMINB12, FOLATE, FERRITIN, TIBC, IRON,  RETICCTPCT in the last 72 hours. Urine analysis:    Component Value Date/Time   COLORURINE YELLOW 02/17/17 1257   APPEARANCEUR HAZY (A) 02-17-17 1257   LABSPEC 1.017 February 17, 2017 1257   PHURINE 6.0 02/17/2017 1257   GLUCOSEU 50 (A) 2017/02/17 1257   HGBUR NEGATIVE Feb 17, 2017 1257   [place] 2017-02-17 1257   KETONESUR NEGATIVE 2017/02/17 1257   PROTEINUR 30 (A) 02-17-17 1257   NITRITE NEGATIVE 02-17-2017 1257   LEUKOCYTESUR NEGATIVE 02/17/17 1257   Sepsis Labs: @LABRCNTIP (procalcitonin:4,lacticacidven:4)  ) Recent Results (from the past 240 hour(s))  Culture, respiratory (NON-Expectorated)     Status: None   Collection Time: 02/17/17 10:19 AM  Result Value Ref Range Status   Specimen Description TRACHEAL ASPIRATE  Final   Special Requests NONE  Final   Gram Stain   Final    MODERATE WBC PRESENT, PREDOMINANTLY  PMN ABUNDANT GRAM NEGATIVE RODS ABUNDANT GRAM POSITIVE COCCI ABUNDANT GRAM POSITIVE RODS    Culture Consistent with normal respiratory flora.  Final   Report Status 01/26/2017 FINAL  Final  Respiratory Panel by PCR     Status: None   Collection Time: 01/24/17 11:40 AM  Result Value Ref Range Status   Adenovirus NOT DETECTED NOT DETECTED Final   Coronavirus 229E NOT DETECTED NOT DETECTED Final   Coronavirus HKU1 NOT DETECTED NOT DETECTED Final   Coronavirus NL63 NOT DETECTED NOT DETECTED Final   Coronavirus OC43 NOT DETECTED NOT DETECTED Final   Metapneumovirus NOT DETECTED NOT DETECTED Final   Rhinovirus / Enterovirus NOT DETECTED NOT DETECTED Final   Influenza A NOT DETECTED NOT DETECTED Final   Influenza B NOT DETECTED NOT DETECTED Final   Parainfluenza Virus 1 NOT DETECTED NOT DETECTED Final   Parainfluenza Virus 2 NOT DETECTED NOT DETECTED Final   Parainfluenza Virus 3 NOT DETECTED NOT DETECTED Final   Parainfluenza Virus 4 NOT DETECTED NOT DETECTED Final   Respiratory Syncytial Virus NOT DETECTED NOT DETECTED Final   Bordetella pertussis  NOT DETECTED NOT DETECTED Final   Chlamydophila pneumoniae NOT DETECTED NOT DETECTED Final   Mycoplasma pneumoniae NOT DETECTED NOT DETECTED Final  Culture, blood (Routine x 2)     Status: Abnormal   Collection Time: 01/24/17 11:45 AM  Result Value Ref Range Status   Specimen Description BLOOD RIGHT FOREARM  Final   Special Requests   Final    BOTTLES DRAWN AEROBIC AND ANAEROBIC Blood Culture adequate volume   Culture  Setup Time   Final    GRAM POSITIVE COCCI IN CLUSTERS AEROBIC BOTTLE ONLY CRITICAL RESULT CALLED TO, READ BACK BY AND VERIFIED WITH: PHARMD B MANCHERIL [id number] MLM    Culture (A)  Final    STAPHYLOCOCCUS SPECIES (COAGULASE NEGATIVE) THE SIGNIFICANCE OF ISOLATING THIS ORGANISM FROM A SINGLE SET OF BLOOD CULTURES WHEN MULTIPLE SETS ARE DRAWN IS UNCERTAIN. PLEASE NOTIFY THE MICROBIOLOGY DEPARTMENT WITHIN ONE WEEK IF SPECIATION AND SENSITIVITIES ARE REQUIRED.    Report Status 01/26/2017 FINAL  Final  Blood Culture ID Panel (Reflexed)     Status: Abnormal   Collection Time: 01/24/17 11:45 AM  Result Value Ref Range Status   Enterococcus species NOT DETECTED NOT DETECTED Final   Listeria monocytogenes NOT DETECTED NOT DETECTED Final   Staphylococcus species DETECTED (A) NOT DETECTED Final    Comment: Methicillin (oxacillin) resistant coagulase negative staphylococcus. Possible blood culture contaminant (unless isolated from more than one blood culture draw or clinical case suggests pathogenicity). No antibiotic treatment is indicated for blood  culture contaminants. CRITICAL RESULT CALLED TO, READ BACK BY AND VERIFIED WITH: PHARMD B MANCHERIL [phone] MLM    Staphylococcus aureus NOT DETECTED NOT DETECTED Final   Methicillin resistance DETECTED (A) NOT DETECTED Final    Comment: CRITICAL RESULT CALLED TO, READ BACK BY AND VERIFIED WITH: PHARMD B MANCHERIL [id number] MLM    Streptococcus species NOT DETECTED NOT DETECTED Final   Streptococcus agalactiae NOT  DETECTED NOT DETECTED Final   Streptococcus pneumoniae NOT DETECTED NOT DETECTED Final   Streptococcus pyogenes NOT DETECTED NOT DETECTED Final   Acinetobacter baumannii NOT DETECTED NOT DETECTED Final   Enterobacteriaceae species NOT DETECTED NOT DETECTED Final   Enterobacter cloacae complex NOT DETECTED NOT DETECTED Final   Escherichia coli NOT DETECTED NOT DETECTED Final   Klebsiella oxytoca NOT DETECTED NOT DETECTED Final   Klebsiella pneumoniae NOT DETECTED NOT DETECTED Final  Proteus species NOT DETECTED NOT DETECTED Final   Serratia marcescens NOT DETECTED NOT DETECTED Final   Haemophilus influenzae NOT DETECTED NOT DETECTED Final   Neisseria meningitidis NOT DETECTED NOT DETECTED Final   Pseudomonas aeruginosa NOT DETECTED NOT DETECTED Final   Candida albicans NOT DETECTED NOT DETECTED Final   Candida glabrata NOT DETECTED NOT DETECTED Final   Candida krusei NOT DETECTED NOT DETECTED Final   Candida parapsilosis NOT DETECTED NOT DETECTED Final   Candida tropicalis NOT DETECTED NOT DETECTED Final  Culture, blood (Routine x 2)     Status: None   Collection Time: 01/24/17 11:53 AM  Result Value Ref Range Status   Specimen Description BLOOD LEFT HAND  Final   Special Requests   Final    BOTTLES DRAWN AEROBIC AND ANAEROBIC Blood Culture adequate volume   Culture NO GROWTH 5 DAYS  Final   Report Status 01/29/2017 FINAL  Final  Urine culture     Status: Abnormal   Collection Time: 01/24/17  1:01 PM  Result Value Ref Range Status   Specimen Description URINE, CLEAN CATCH  Final   Special Requests Normal  Final   Culture MULTIPLE SPECIES PRESENT, SUGGEST RECOLLECTION (A)  Final   Report Status 01/25/2017 FINAL  Final  MRSA PCR Screening     Status: Abnormal   Collection Time: 01/26/17 12:47 PM  Result Value Ref Range Status   MRSA by PCR POSITIVE (A) NEGATIVE Final    Comment:        The GeneXpert MRSA Assay (FDA approved for NASAL specimens only), is one component of  a comprehensive MRSA colonization surveillance program. It is not intended to diagnose MRSA infection nor to guide or monitor treatment for MRSA infections. RESULT CALLED TO, READ BACK BY AND VERIFIED WITH: H. Deaton RN 14:40 01/26/17 ([email or web address])   Surgical pcr screen     Status: Abnormal   Collection Time: 01/29/17  8:37 PM  Result Value Ref Range Status   MRSA, PCR POSITIVE (A) NEGATIVE Final    Comment: RESULT CALLED TO, READ BACK BY AND VERIFIED WITH: DECAREAUX,T RN 0034 01/30/17 MITCHELL,L    Staphylococcus aureus POSITIVE (A) NEGATIVE Final    Comment: (NOTE) The Xpert SA Assay (FDA approved for NASAL specimens in patients 12 years of age and older), is one component of a comprehensive surveillance program. It is not intended to diagnose infection nor to guide or monitor treatment.          Radiology Studies: Dg C-arm 1-60 Min  Result Date: 01/30/2017 CLINICAL DATA:  ORIF left hip fracture. EXAM: DG C-ARM 61-120 MIN; OPERATIVE LEFT HIP WITH PELVIS COMPARISON:  None. FINDINGS: Intraoperative views of both hips are submitted postoperatively for interpretation. Internal nail and screw fixation of the proximal left femur noted traversing a intertrochanteric fracture, in near-anatomic alignment and position. A single view of the right hip demonstrates an apparent subcapital fracture of the right femur with superior displacement/shortening and rotation of the femoral head. IMPRESSION: 1. ORIF left femur fracture. 2. Apparent right subcapital right femur fracture with shortening and rotation of the femoral head. Electronically Signed   By: Margarette Canada M.D.   On: 01/30/2017 11:25   Dg Hip Port Unilat With Pelvis 1v Left  Result Date: 01/28/2017 CLINICAL DATA:  Hip fracture EXAM: DG HIP (WITH OR WITHOUT PELVIS) 1V PORT LEFT COMPARISON:  12/28/2015 FINDINGS: A single portable view of the left hip was obtained. This documents a comminuted intertrochanteric femoral neck fracture with varus  angulation.  IMPRESSION: Comminuted intertrochanteric femoral neck fracture. Electronically Signed   By: Misty Stanley M.D.   On: 01/28/2017 21:01   Dg Hip Operative Unilat W Or W/o Pelvis Left  Result Date: 01/30/2017 CLINICAL DATA:  ORIF left hip fracture. EXAM: DG C-ARM 61-120 MIN; OPERATIVE LEFT HIP WITH PELVIS COMPARISON:  None. FINDINGS: Intraoperative views of both hips are submitted postoperatively for interpretation. Internal nail and screw fixation of the proximal left femur noted traversing a intertrochanteric fracture, in near-anatomic alignment and position. A single view of the right hip demonstrates an apparent subcapital fracture of the right femur with superior displacement/shortening and rotation of the femoral head. IMPRESSION: 1. ORIF left femur fracture. 2. Apparent right subcapital right femur fracture with shortening and rotation of the femoral head. Electronically Signed   By: Margarette Canada M.D.   On: 01/30/2017 11:25        Scheduled Meds: . acetaminophen  500 mg Per Tube TID  . albuterol  2.5 mg Inhalation BID  . amantadine  150 mg Per Tube BID  . amLODipine  10 mg Per Tube Daily  . atorvastatin  20 mg Per Tube QHS  . baclofen  5 mg Per Tube QID  . bethanechol  5 mg Per Tube Daily  . bromocriptine  5 mg Oral Daily  . calcium-vitamin D  1 tablet Per Tube Q breakfast  . carbidopa-levodopa  2 tablet Per Tube TID BM  . chlorhexidine  15 mL Mouth/Throat BID  . cholecalciferol  2,000 Units Oral Daily  . [START ON 01/31/2017] enoxaparin (LOVENOX) injection  40 mg Subcutaneous Q24H  . feeding supplement (OSMOLITE 1.5 CAL)  1,560 mL Per Tube Daily  . feeding supplement (OSMOLITE 1.5 CAL)  237 mL Per Tube BID  . fluticasone  1 spray Each Nare Daily  . free water  200 mL Per Tube Q6H  . gabapentin  100 mg Per Tube BID  . guaiFENesin  10 mL Per Tube QID  . ketoconazole  1 application Topical BID  . loratadine  10 mg Per Tube Daily  . Melatonin  6 mg Per Tube QHS  .  multivitamin  15 mL Per Tube Q0600  . mupirocin ointment  1 application Nasal BID  . pantoprazole sodium  40 mg Per Tube Daily  . polyvinyl alcohol  1 drop Both Eyes Daily  . primidone  50 mg Per Tube QID  . saccharomyces boulardii  250 mg Per Tube BID  . senna  1 tablet Oral BID  . valACYclovir  500 mg Per Tube Daily  . vitamin C  500 mg Per Tube Daily   Continuous Infusions: . sodium chloride 50 mL/hr at 01/30/17 1255  . lactated ringers 50 mL/hr at 01/30/17 0823  . remifentanil (ULTIVA) 2 mg in 100 mL normal saline (20 mcg/mL) Optime Stopped (01/30/17 1037)  . vancomycin (VANCOCIN) 1000 mg/246mL IVPB       LOS: 6 days    Time spent: 30 minutes    Edwin Dada, MD [facility] 01/30/2017, 2:19 PM     Pager [phone] --- please page though AMION:  [email or web address] If 7PM-7AM, please contact night-coverage

## 2017-01-30 NOTE — Op Note (Signed)
NAMESAADIYA, Judy Spears NO.:  [id number]  MEDICAL RECORD NO.:  [id number]  LOCATION:  [facility]                         FACILITY:  [facility]  PHYSICIAN:  Pietro Cassis. Alvan Dame, M.D.  DATE OF BIRTH:  Jun 21, 1950  DATE OF PROCEDURE:  01/30/2017 DATE OF DISCHARGE:                              OPERATIVE REPORT   PREOPERATIVE DIAGNOSIS:  Comminuted peritrochanteric left proximal femur fracture.  POSTOPERATIVE DIAGNOSIS:  Comminuted peritrochanteric left proximal femur fracture.  PROCEDURE:  Open reduction and internal fixation of left peritrochanteric femur fracture utilizing a Biomet Affixus nail 11 x 180 mm with a 125-degree lag screw and a distal interlock.  SURGEON:  Pietro Cassis. Alvan Dame, MD.  ASSISTANT:  Surgical team.  ANESTHESIA:  General through previous tracheostomy.  SPECIMENS:  None.  COMPLICATIONS:  None apparent.  BLOOD LOSS:  Less than 100 mL.  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  She had sustained an injury to her right hip at some point, was diagnosed with femoral neck fracture.  She was recently hospitalized for pulmonary complications and noted on the abdominal x-ray to have a fracture of the left hip.  A portable x-ray did confirm an acute fracture of the left proximal femur in an intertroch/peritroch pattern.  Risks, benefits, and necessity of the procedure were discussed and reviewed with her husband and consent was obtained for fracture management.  Risks included infection, DVT, malunion, nonunion, and need for future surgeries. Consent was obtained.  PROCEDURE IN DETAIL:  The patient was brought to the operative theater. Once adequate anesthesia was established, she was carefully transferred over to the fracture table.  IV vancomycin was given due to noted allergy, some sort of reaction to  CEFEPIME.  She was noted to have significant upper and lower body contractures. Her arms were positioned carefully across her body and held in position. Her left leg was placed into the traction boot.  Perineal post placed. I was unable to flex and abduct her right leg in a position adequate enough for fluoroscopy to come in a traditional manner.  We then padded her foot and then placed it onto the table in a slightly extended posture.  Once she was adequately positioned with bony prominences padded, I applied traction and internal rotation to her left lower extremity to obtain traction radiographs.  These radiographs, albeit a bit of a challenge, indicated a near anatomic reduction with comminution in the peritrochanteric segment.  Once this was confirmed, I prescrubbed her left hip with Betadine.  We then prepped and draped the left hip with a shower curtain technique. Fluoroscopy was brought back to the field.  The tip of the trochanter was identified.  I made an incision posterolateral to be able to have access to the trochanter.  Soft tissue dissection was carried down to the gluteal fascia, which was incised.  A guidewire was then attempted to be inserted into the tip of the trochanter.  However, the tip of the trochanter was displaced.  Thus, in the proximal femur, the guidewire was inserted and passed across the fracture site into the  proximal aspect of the metaphysis.  Once this was confirmed radiographically in AP and lateral planes, I drilled the proximal femur, then passed an 11 x 180 mm nail with an insertion jig.  Once it was appropriate depth, the guidewire was inserted into the center of the femoral head in AP and lateral planes, confirmed radiographically.  I then measured and selected a 105-mm lag screw.  I drilled for this and then passed the lag screw.  The lag screw was positioned into the subchondral bone with decent bone purchase.  I then used the compression  wheel and medialized the shaft to the fracture site.  I elected it based on her bone quality to use this as a fixed angle device as opposed to having further compression.  I locked the proximal bolt down.  Once this was done, the distal interlock was placed.  Following placement of the distal interlock, the jig was removed.  All wounds were irrigated with normal saline solution.  I reapproximated the gluteal fascia using #1 Vicryl.  The remainder of the wounds were closed in layers with 0 Vicryl and a running Monocryl stitch.  The wounds were cleaned, dried, and dressed sterilely using surgical glue and Aquacel dressings.  She was then carefully transitioned back to the hospital bed, extubated and her trach equipment reapplied.     Pietro Cassis Alvan Dame, M.D.     MDO/MEDQ  D:  01/30/2017  T:  01/30/2017  Job:  [id number]

## 2017-01-30 NOTE — Anesthesia Postprocedure Evaluation (Signed)
Anesthesia Post Note  Patient: Judy Spears  Procedure(s) Performed: ORIF AFFIXUS NAIL (Left Leg Lower)     Patient location during evaluation: PACU Anesthesia Type: General Level of consciousness: awake and alert Pain management: pain level controlled Vital Signs Assessment: post-procedure vital signs reviewed and stable Respiratory status: spontaneous breathing, nonlabored ventilation, respiratory function stable and patient connected to nasal cannula oxygen Cardiovascular status: blood pressure returned to baseline and stable Postop Assessment: no apparent nausea or vomiting Anesthetic complications: no    Last Vitals:  Vitals:   01/30/17 1145 01/30/17 1157  BP: 133/83 131/78  Pulse: 83 81  Resp: 18 18  Temp:    SpO2: 100% 99%    Last Pain:  Vitals:   01/29/17 1601  TempSrc: Axillary  PainSc: 0-No pain                 Tiajuana Amass

## 2017-01-30 NOTE — H&P (View-Only) (Signed)
Patient ID: Aundraya Dripps [name], female   DOB: Jul 09, 1950, 67 y.o.   MRN: [id number]  Status unchanged  NPO To OR today for ORIF of her left hip Consent signed by her husband

## 2017-01-30 NOTE — Progress Notes (Signed)
Notified of patient's O2 sats in 70s by CCM, went to room and initiated suction- sats improved briefly but RN placed patient on O2 for support. Replaced O2 monitor, called respiratory therapy, and patient steadily improved. Respiratory therapy was in room within minutes and patient was sats of 100%. Currently weaning off O2 to place back on medical air. Discussed with MD. Will continue to monitor.

## 2017-01-30 NOTE — Brief Op Note (Signed)
01/24/2017 - 01/30/2017  8:15 AM  PATIENT:  Judy Spears  67 y.o. female  PRE-OPERATIVE DIAGNOSIS:  Left peri-trochanteric hip fracture  POST-OPERATIVE DIAGNOSIS:  Left peri-trochanteric hip fracture  PROCEDURE:  Procedure(s): ORIF left hip - AFFIXUS NAIL (Left)  SURGEON:  Surgeon(s) and Role:    Paralee Cancel, MD - Primary  PHYSICIAN ASSISTANT: None  ANESTHESIA:   general  EBL:  <100 cc  BLOOD ADMINISTERED:none  DRAINS: none   LOCAL MEDICATIONS USED:  NONE  SPECIMEN:  No Specimen  DISPOSITION OF SPECIMEN:  N/A  COUNTS:  YES  TOURNIQUET:  * No tourniquets in log *  DICTATION: .Other Dictation: Dictation Number [id number]  PLAN OF CARE: Admit to inpatient   PATIENT DISPOSITION:  PACU - hemodynamically stable.   Delay start of Pharmacological VTE agent (>24hrs) due to surgical blood loss or risk of bleeding: no

## 2017-01-30 NOTE — Anesthesia Procedure Notes (Signed)
Procedure Name: Intubation Date/Time: 01/30/2017 9:07 AM Performed by: Jenne Campus, CRNA Pre-anesthesia Checklist: Patient identified, Emergency Drugs available, Suction available, Patient being monitored and Timeout performed Patient Re-evaluated:Patient Re-evaluated prior to induction Oxygen Delivery Method: Circle system utilized Preoxygenation: Pre-oxygenation with 100% oxygen Induction Type: IV induction and Tracheostomy Tube type: Oral Tube size: 5.5 mm Number of attempts: 1 Placement Confirmation: positive ETCO2 and breath sounds checked- equal and bilateral Secured at: 14 cm Tube secured with: Tape Dental Injury: Teeth and Oropharynx as per pre-operative assessment  Comments: Smooth IV induction. 4.0 cuff-less Shiley trach removed and 5.5 cuffed ETT easily placed in tracheostomy by Dr Rodman Comp. +ETCO2. BBSE. ETT secured with tape to patient's chest.

## 2017-01-30 NOTE — Transfer of Care (Signed)
Immediate Anesthesia Transfer of Care Note  Patient: Judy Spears  Procedure(s) Performed: ORIF AFFIXUS NAIL (Left Leg Lower)  Patient Location: PACU  Anesthesia Type:General  Level of Consciousness: sedated and patient cooperative  Airway & Oxygen Therapy: Patient Spontanous Breathing and Patient connected to tracheostomy mask oxygen  Post-op Assessment: Report given to RN and Post -op Vital signs reviewed and stable  Post vital signs: Reviewed  Last Vitals:  Vitals:   01/30/17 0401 01/30/17 0450  BP:  (!) 148/89  Pulse:    Resp:    Temp:    SpO2: 98%     Last Pain:  Vitals:   01/29/17 1601  TempSrc: Axillary  PainSc: 0-No pain      Patients Stated Pain Goal: 0 (49/44/73 [id number])  Complications: No apparent anesthesia complications

## 2017-01-30 NOTE — Interval H&P Note (Signed)
History and Physical Interval Note:  01/30/2017 8:15 AM  [name]  has presented today for surgery, with the diagnosis of left hip fx  The various methods of treatment have been discussed with the patient and family. After consideration of risks, benefits and other options for treatment, the patient has consented to  Procedure(s): ORIF AFFIXUS NAIL (Left) as a surgical intervention .  The patient's history has been reviewed, patient examined, no change in status, stable for surgery.  I have reviewed the patient's chart and labs.  Questions were answered to the patient's satisfaction.     Mauri Pole

## 2017-01-30 NOTE — Progress Notes (Signed)
[facility] PCCM Admission  01/24/2017  brief 67 y.o.femalewith a PMHrelevant forchronic respiratory failure s/p tracheostomy, s/p Peg tube andchronic encephalopathy secondary to [place] Virusand history of breast cancer who is brought in by husband due to concerns about persistent hypoxia at home on 01/24/2017.Patient wasrecently admitted11/11/18 through 01/04/17 foracute on chronic respiratory failure,Following that hospitalization, she was transferred to [facility] for in hospital rehab. Per family, she was just discharged from there on 01/21/17 and since discharge, had been doing well until morning of 12/31/18when she had dyspnea and hypoxia (SpO2 in 80s). Family attempted chest PT manuevers (which they were coached on during previous admissions), but symptoms did not improve, therefore, she was brought to ED for further eval.  Family felt that she might have had mucous plugging at home. In ED, RT was able to suction several plugs out after saline lavage. Patient was  seen at bedside 01/25/2017 by  Dr. Lake Bells who is very familiar with patient.The patient has a history of MRSA and Pseudomonas respiratory infections, thereforen coverage for both organisms was initiated 01/25/2017 with vanc and ceftazidime.   EVENTS 01/28/17 - 28% ATC  With increased flow for increased humidity. Last plug noted /02/2017. Low grade fever   . SUBJECTIVE/OVERNIGHT/INTERVAL HX 01/30/2017  - preop eval for Left ORIF - left peritrochanteric fracture. Surgeon Dr Adriana Mccallum. Per RN - no fever. No changes. On [place]. No changes to mental status. BP/HR stable  O: Vitals:   01/29/17 2354 01/30/17 0016 01/30/17 0401 01/30/17 0450  BP: 118/77   (!) 148/89  Pulse: 79     Resp: (!) 32     Temp:      TempSrc:      SpO2: 98% 98% 98%   Weight:      Height:           General Appearance:    Looks chronic ill with contractures and deconditioning  Head:    Normocephalic, without obvious abnormality,  atraumatic  Eyes:    PERRL - yes, conjunctiva/corneas - clear      Ears:    Normal external ear canals, both ears  Nose:   NG tube - no  Throat:  ETT TUBE - NO BUT HAS TRACH , OG tube - no  Neck:   Supple,  No enlargement/tenderness/nodules     Lungs:     Baseline coarse breeath sound  Chest wall:    No deformity  Heart:    S1 and S2 normal, no murmur, CVP - no.  Pressors - no  Abdomen:     Soft, no masses, no organomegaly  Genitalia:    Not done  Rectal:   not done  Extremities:   Extremities- contractures     Skin:   Intact in exposed areas .      Neurologic:   Sedation - none -> RASS - 0 to +1 . Moves all 4s - contractures. CAM-ICU - na . Orientation - na -> nods     PULMONARY Recent Labs  Lab 01/24/17 1159  HCO3 28.9*  TCO2 30  O2SAT 88.0    CBC Recent Labs  Lab 01/27/17 1456 01/28/17 1340 01/29/17 0237  HGB 10.6* 10.8* 10.8*  HCT 32.4* 33.1* 34.4*  WBC 9.9 12.5* 9.4  PLT 299 321 355    COAGULATION Recent Labs  Lab 01/24/17 1011  INR 1.08    CARDIAC  No results for input(s): TROPONINI in the last 168 hours. No results for input(s): PROBNP in the last 168  hours.   CHEMISTRY Recent Labs  Lab 01/24/17 1011 01/25/17 0147 01/27/17 0231 01/28/17 1340 01/29/17 0237  NA 129* 131* 131* 129* 132*  K 4.1 3.3* 4.2 4.4 4.1  CL 92* 90* 98* 93* 95*  CO2 26 27 25 27 28   GLUCOSE 134* 129* 128* 84 140*  BUN 22* 15 10 12 13   CREATININE 0.40* <0.30* <0.30* <0.30* <0.30*  CALCIUM 8.9 8.5* 8.2* 8.7* 8.5*  MG  --   --   --   --  2.0   CrCl cannot be calculated (This lab value cannot be used to calculate CrCl because it is not a number: <0.30).   LIVER Recent Labs  Lab 01/24/17 1011 01/27/17 0231 01/29/17 0237  AST 53* 45* 43*  ALT 25 64* 54  ALKPHOS 113 114 127*  BILITOT 0.9 0.8 0.8  PROT 6.2* 5.8* 5.8*  ALBUMIN 3.1* 2.6* 2.6*  INR 1.08  --   --      INFECTIOUS Recent Labs  Lab 01/24/17 1025 01/24/17 1159 01/25/17 1233  LATICACIDVEN 2.09*  2.32* 1.3     ENDOCRINE CBG (last 3)  No results for input(s): GLUCAP in the last 72 hours.       IMAGING x48h  - image(s) personally visualized  -   highlighted in bold Dg Chest Port 1 View  Result Date: 01/28/2017 CLINICAL DATA:  Respiratory failure. EXAM: PORTABLE CHEST 1 VIEW COMPARISON:  01/24/2017 FINDINGS: Cardiomediastinal silhouette is normal. Mediastinal contours appear intact. Calcific atherosclerotic disease and tortuosity of the aorta. There is no evidence of pleural effusion or pneumothorax. Interval development of right lower lung field airspace opacity. Osseous structures are without acute abnormality. Soft tissues are grossly normal. IMPRESSION: Tracheostomy tube in satisfactory position. Interval development of right lower lung field airspace opacity, which may represent atelectasis or airspace infiltrate. Electronically Signed   By: Fidela Salisbury M.D.   On: 01/28/2017 12:54   Dg Hip Port Unilat With Pelvis 1v Left  Result Date: 01/28/2017 CLINICAL DATA:  Hip fracture EXAM: DG HIP (WITH OR WITHOUT PELVIS) 1V PORT LEFT COMPARISON:  12/28/2015 FINDINGS: A single portable view of the left hip was obtained. This documents a comminuted intertrochanteric femoral neck fracture with varus angulation. IMPRESSION: Comminuted intertrochanteric femoral neck fracture. Electronically Signed   By: Misty Stanley M.D.   On: 01/28/2017 21:01    Results for orders placed or performed during the hospital encounter of 01/24/17  Culture, respiratory (NON-Expectorated)     Status: None   Collection Time: 01/24/17 10:19 AM  Result Value Ref Range Status   Specimen Description TRACHEAL ASPIRATE  Final   Special Requests NONE  Final   Gram Stain   Final    MODERATE WBC PRESENT, PREDOMINANTLY PMN ABUNDANT GRAM NEGATIVE RODS ABUNDANT GRAM POSITIVE COCCI ABUNDANT GRAM POSITIVE RODS    Culture Consistent with normal respiratory flora.  Final   Report Status 01/26/2017 FINAL  Final   Respiratory Panel by PCR     Status: None   Collection Time: 01/24/17 11:40 AM  Result Value Ref Range Status   Adenovirus NOT DETECTED NOT DETECTED Final   Coronavirus 229E NOT DETECTED NOT DETECTED Final   Coronavirus HKU1 NOT DETECTED NOT DETECTED Final   Coronavirus NL63 NOT DETECTED NOT DETECTED Final   Coronavirus OC43 NOT DETECTED NOT DETECTED Final   Metapneumovirus NOT DETECTED NOT DETECTED Final   Rhinovirus / Enterovirus NOT DETECTED NOT DETECTED Final   Influenza A NOT DETECTED NOT DETECTED Final   Influenza B  NOT DETECTED NOT DETECTED Final   Parainfluenza Virus 1 NOT DETECTED NOT DETECTED Final   Parainfluenza Virus 2 NOT DETECTED NOT DETECTED Final   Parainfluenza Virus 3 NOT DETECTED NOT DETECTED Final   Parainfluenza Virus 4 NOT DETECTED NOT DETECTED Final   Respiratory Syncytial Virus NOT DETECTED NOT DETECTED Final   Bordetella pertussis NOT DETECTED NOT DETECTED Final   Chlamydophila pneumoniae NOT DETECTED NOT DETECTED Final   Mycoplasma pneumoniae NOT DETECTED NOT DETECTED Final  Culture, blood (Routine x 2)     Status: Abnormal   Collection Time: 01/24/17 11:45 AM  Result Value Ref Range Status   Specimen Description BLOOD RIGHT FOREARM  Final   Special Requests   Final    BOTTLES DRAWN AEROBIC AND ANAEROBIC Blood Culture adequate volume   Culture  Setup Time   Final    GRAM POSITIVE COCCI IN CLUSTERS AEROBIC BOTTLE ONLY CRITICAL RESULT CALLED TO, READ BACK BY AND VERIFIED WITH: PHARMD B MANCHERIL [phone] MLM    Culture (A)  Final    STAPHYLOCOCCUS SPECIES (COAGULASE NEGATIVE) THE SIGNIFICANCE OF ISOLATING THIS ORGANISM FROM A SINGLE SET OF BLOOD CULTURES WHEN MULTIPLE SETS ARE DRAWN IS UNCERTAIN. PLEASE NOTIFY THE MICROBIOLOGY DEPARTMENT WITHIN ONE WEEK IF SPECIATION AND SENSITIVITIES ARE REQUIRED.    Report Status 01/26/2017 FINAL  Final  Blood Culture ID Panel (Reflexed)     Status: Abnormal   Collection Time: 01/24/17 11:45 AM  Result Value Ref  Range Status   Enterococcus species NOT DETECTED NOT DETECTED Final   Listeria monocytogenes NOT DETECTED NOT DETECTED Final   Staphylococcus species DETECTED (A) NOT DETECTED Final    Comment: Methicillin (oxacillin) resistant coagulase negative staphylococcus. Possible blood culture contaminant (unless isolated from more than one blood culture draw or clinical case suggests pathogenicity). No antibiotic treatment is indicated for blood  culture contaminants. CRITICAL RESULT CALLED TO, READ BACK BY AND VERIFIED WITH: PHARMD B MANCHERIL [phone] MLM    Staphylococcus aureus NOT DETECTED NOT DETECTED Final   Methicillin resistance DETECTED (A) NOT DETECTED Final    Comment: CRITICAL RESULT CALLED TO, READ BACK BY AND VERIFIED WITH: PHARMD B MANCHERIL [phone] MLM    Streptococcus species NOT DETECTED NOT DETECTED Final   Streptococcus agalactiae NOT DETECTED NOT DETECTED Final   Streptococcus pneumoniae NOT DETECTED NOT DETECTED Final   Streptococcus pyogenes NOT DETECTED NOT DETECTED Final   Acinetobacter baumannii NOT DETECTED NOT DETECTED Final   Enterobacteriaceae species NOT DETECTED NOT DETECTED Final   Enterobacter cloacae complex NOT DETECTED NOT DETECTED Final   Escherichia coli NOT DETECTED NOT DETECTED Final   Klebsiella oxytoca NOT DETECTED NOT DETECTED Final   Klebsiella pneumoniae NOT DETECTED NOT DETECTED Final   Proteus species NOT DETECTED NOT DETECTED Final   Serratia marcescens NOT DETECTED NOT DETECTED Final   Haemophilus influenzae NOT DETECTED NOT DETECTED Final   Neisseria meningitidis NOT DETECTED NOT DETECTED Final   Pseudomonas aeruginosa NOT DETECTED NOT DETECTED Final   Candida albicans NOT DETECTED NOT DETECTED Final   Candida glabrata NOT DETECTED NOT DETECTED Final   Candida krusei NOT DETECTED NOT DETECTED Final   Candida parapsilosis NOT DETECTED NOT DETECTED Final   Candida tropicalis NOT DETECTED NOT DETECTED Final  Culture, blood (Routine x  2)     Status: None   Collection Time: 01/24/17 11:53 AM  Result Value Ref Range Status   Specimen Description BLOOD LEFT HAND  Final   Special Requests   Final  BOTTLES DRAWN AEROBIC AND ANAEROBIC Blood Culture adequate volume   Culture NO GROWTH 5 DAYS  Final   Report Status 01/29/2017 FINAL  Final  Urine culture     Status: Abnormal   Collection Time: 01/24/17  1:01 PM  Result Value Ref Range Status   Specimen Description URINE, CLEAN CATCH  Final   Special Requests Normal  Final   Culture MULTIPLE SPECIES PRESENT, SUGGEST RECOLLECTION (A)  Final   Report Status 01/25/2017 FINAL  Final  MRSA PCR Screening     Status: Abnormal   Collection Time: 01/26/17 12:47 PM  Result Value Ref Range Status   MRSA by PCR POSITIVE (A) NEGATIVE Final    Comment:        The GeneXpert MRSA Assay (FDA approved for NASAL specimens only), is one component of a comprehensive MRSA colonization surveillance program. It is not intended to diagnose MRSA infection nor to guide or monitor treatment for MRSA infections. RESULT CALLED TO, READ BACK BY AND VERIFIED WITH: H. Deaton RN 14:40 01/26/17 ([email or web address])   Surgical pcr screen     Status: Abnormal   Collection Time: 01/29/17  8:37 PM  Result Value Ref Range Status   MRSA, PCR POSITIVE (A) NEGATIVE Final    Comment: RESULT CALLED TO, READ BACK BY AND VERIFIED WITH: DECAREAUX,T RN 0034 01/30/17 MITCHELL,L    Staphylococcus aureus POSITIVE (A) NEGATIVE Final    Comment: (NOTE) The Xpert SA Assay (FDA approved for NASAL specimens in patients 59 years of age and older), is one component of a comprehensive surveillance program. It is not intended to diagnose infection nor to guide or monitor treatment.    *Note: Due to a large number of results and/or encounters for the requested time period, some results have not been displayed. A complete set of results can be found in Results Review.     Impression/plan:  New PREOP PULM CONSULT prior to hip  surgery  [place] Postperative Pulmonary Risk Score - for prolonged mech vent post surgery Comment Score  Type of surgery - abd ao aneurysm (27), thoracic (21), neurosurgery / upper abdominal / vascular (21), neck (11) Left hp 0  Emergency Surgery - (11) no 0  ALbumin < 3 or poor nutritional state - (9) yes 9  BUN > 30 -  (8) no 0  Partial or completely dependent functional status - (7) yes 7  COPD -  (6) No  -  Age - 60 to 69 (4), > 70  (6) 65 4  TOTAL  20  Risk Stratifcation scores  - < 10, 11-19, 20-27, 28-40, >40  Moderate risk for prolonged vent dependence     CANET Postperative Pulmonary Risk Score - for any resp complication Comment Score  Age - <50 (0), 50-80 (3), >80 (16) Age 19 3  Preoperative pulse ox - >96 (0), 91-95 (8), <90 (24) 98% rA 0  Respiratory infection in last month - Yes (17) yes 17  Preoperative anemia - < 10gm% - Yes (11) no 0  Surgical incision - Upper abdominal (15), Thoracic (24) Left hip  0  Duration of surgery - <2h (0), 2-3h (16), >3h (23) 2h approx 0  Emergency Surgery - Yes (8) no 0  TOTAL    Risk Stratification - Low (<26), Intermediate (26-44), High (>45)  20 if surgery is < 2h bt if > 2h then score is 26   - overall she islow- moderate risk for complication such as prolonged mech vent following hip surgery. Risk  stems from debilitated state, age 66 poor albumin, and recent infections. However, on positive side risk reducing factors are short surgery duration and non thoracic/abd surgery and good hgb status and good renal status. IF duration of surgery goes into >3h then risk is higher   PLAN  - per Ortho and anehsthisia  - no contraindication for surgery - good post op pulmonary toilet essential     Acute respiratory failure with hypoxemia: continue O2 to maintain O2 saturation > 90% T Max 100.1 01/27/2017 Acute tracheobronchitis, history of MRSA:    - no fever since T Max 01/26/17 23.58h . Size 4 trach. Cutlrue negative (BC MRSE might be  contaminant)  PLAN - antibiotic therapy per below -Continue hypertonic saline, chest physiotherapy. >> per family helping. Last plug 01/26/17 -Continue pulmonary toilet,bronchodilators and mucolytics, -Step down status for continued aggressive RT assist, suctioning  and trach management -CXR now -Trach care per unit protocol -ATC with forced medical air for humidity -Titrate oxygen for saturations of >94%  Anti-infectives (From admission, onward)   Start     Dose/Rate Route Frequency Ordered Stop   01/30/17 0600  vancomycin (VANCOCIN) IVPB 1000 mg/200 mL premix     1,000 mg 200 mL/hr over 60 Minutes Intravenous On call to O.R. 01/29/17 2003 01/31/17 0559   01/26/17 0600  vancomycin (VANCOCIN) IVPB 1000 mg/200 mL premix  Status:  Discontinued     1,000 mg 200 mL/hr over 60 Minutes Intravenous Every 8 hours 01/25/17 2319 01/28/17 1507   01/25/17 1000  valACYclovir (VALTREX) tablet 500 mg     500 mg Per Tube Daily 01/24/17 1611     01/24/17 2200  vancomycin (VANCOCIN) IVPB 750 mg/150 ml premix  Status:  Discontinued     750 mg 150 mL/hr over 60 Minutes Intravenous Every 8 hours 01/24/17 1135 01/25/17 2319   01/24/17 1500  cefTAZidime (FORTAZ) 1 g in dextrose 5 % 50 mL IVPB     1 g 100 mL/hr over 30 Minutes Intravenous Every 8 hours 01/24/17 1423 01/30/17 0700   01/24/17 1145  vancomycin (VANCOCIN) IVPB 1000 mg/200 mL premix     1,000 mg 200 mL/hr over 60 Minutes Intravenous  Once 01/24/17 1131 01/24/17 1354   01/24/17 1130  aztreonam (AZACTAM) 2 g in dextrose 5 % 50 mL IVPB     2 g 100 mL/hr over 30 Minutes Intravenous  Once 01/24/17 1124 01/24/17 1249         Dysphagia -recommendations per primary team and SLP - Aspiration precautions  Husband updated at bedside   Dr. Brand Males, M.D., [facility].C.P Pulmonary and Critical Care Medicine Staff Physician, [place] Director - Interstitial Lung Disease  Program  Pulmonary [place] at [place], [place], [address]  Pager: [phone], If no answer or between  15:00h - 7:00h: call [phone] Telephone: [phone]

## 2017-01-31 ENCOUNTER — Encounter (HOSPITAL_COMMUNITY): Payer: Self-pay | Admitting: Orthopedic Surgery

## 2017-01-31 LAB — CBC
HCT: 31.3 % — ABNORMAL LOW (ref 36.0–46.0)
HEMOGLOBIN: 9.7 g/dL — AB (ref 12.0–15.0)
MCH: 27.8 pg (ref 26.0–34.0)
MCHC: 31 g/dL (ref 30.0–36.0)
MCV: 89.7 fL (ref 78.0–100.0)
Platelets: 460 10*3/uL — ABNORMAL HIGH (ref 150–400)
RBC: 3.49 MIL/uL — ABNORMAL LOW (ref 3.87–5.11)
RDW: 15.6 % — ABNORMAL HIGH (ref 11.5–15.5)
WBC: 9.9 10*3/uL (ref 4.0–10.5)

## 2017-01-31 LAB — BASIC METABOLIC PANEL
Anion gap: 10 (ref 5–15)
BUN: 18 mg/dL (ref 6–20)
CHLORIDE: 93 mmol/L — AB (ref 101–111)
CO2: 28 mmol/L (ref 22–32)
CREATININE: 0.31 mg/dL — AB (ref 0.44–1.00)
Calcium: 8.7 mg/dL — ABNORMAL LOW (ref 8.9–10.3)
GFR calc Af Amer: >60 mL/min (ref >60)
GFR calc non Af Amer: >60 mL/min (ref >60)
GLUCOSE: 85 mg/dL (ref 65–99)
POTASSIUM: 4.2 mmol/L (ref 3.5–5.1)
Sodium: 131 mmol/L — ABNORMAL LOW (ref 135–145)

## 2017-01-31 MED ORDER — OXYCODONE HCL 5 MG PO TABS: 2.5000 mg | ORAL_TABLET | ORAL | 0 refills | Status: DC | PRN

## 2017-01-31 MED ORDER — IBUPROFEN 100 MG/5ML PO SUSP
400.0000 mg | Freq: Four times a day (QID) | ORAL | Status: DC | PRN
  Administered 2017-01-31: 400 mg via ORAL
  Filled 2017-01-31 (×2): qty 20

## 2017-01-31 MED ORDER — ENOXAPARIN SODIUM 40 MG/0.4ML ~~LOC~~ SOLN: 40.0000 mg | SUBCUTANEOUS | 0 refills | Status: DC

## 2017-01-31 NOTE — Progress Notes (Signed)
Patient ID: Judy Spears, female   DOB: 1950-11-29, 67 y.o.   MRN: [id number] Subjective: 1 Day Post-Op Procedure(s) (LRB): ORIF AFFIXUS NAIL (Left)   She is awake, alert.  Verbally non-communicative but responds to her husband No reported events  Objective:   VITALS:   Vitals:   01/31/17 1630 01/31/17 1943  BP: 121/67 (!) 96/57  Pulse: 92 79  Resp: (!) 30 (!) 27  Temp:  98.8 F (37.1 C)  SpO2: 93% 97%    Incision: dressing C/D/I  Chronic upper and lower extremity contractures  LABS Recent Labs    01/30/17 0733 01/30/17 1338 01/31/17 0340  HGB 12.2 11.3* 9.7*  HCT 36.3 35.6* 31.3*  WBC 12.4* 13.2* 9.9  PLT 474* 428* 460*    Recent Labs    01/29/17 0237 01/30/17 0733 01/30/17 1338 01/31/17 0340  NA 132* 126*  --  131*  K 4.1 4.0  --  4.2  BUN 13 11  --  18  CREATININE <0.30* <0.30* 0.30* 0.31*  GLUCOSE 140* 95  --  85    Recent Labs    01/30/17 0733  INR 1.07     Assessment/Plan: 1 Day Post-Op Procedure(s) (LRB): ORIF AFFIXUS NAIL (Left)   Working on disposition plans, reviewed with her husband WBAT LLE and RLE for transfers is fine RTC in 2 weeks lovenox for 2 weeks Pain meds as prescribed for pain - oxycodone aquacell dressing in place - will remove at first office visit

## 2017-01-31 NOTE — Progress Notes (Signed)
Nutrition Follow-up  DOCUMENTATION CODES:   Not applicable  INTERVENTION:  -Continue Osmolite 1.5@ 159m/hr via PEG over 14 hour period (1700-0700)   -Continue bolus feedings of 237 ml Osmolite 1.5 BID (1000 and 1400)via PEG  -Continue 200 ml free water flush every 6 hours  Tube feeding regimen provides3231kcal (>100% of needs),135grams of protein, and 24473mof H2O.   -Recommend SLP evaluation when appropriate to determine safest diet  NUTRITION DIAGNOSIS:   Increased nutrient needs related to chronic illness as evidenced by estimated needs.  Ongoing  GOAL:   Patient will meet greater than or equal to 90% of their needs  Met with TF  MONITOR:   PO intake, Supplement acceptance, Diet advancement, Labs, Weight trends, Skin, I & O's  REASON FOR ASSESSMENT:   Consult Enteral/tube feeding initiation and management  ASSESSMENT:   Judy Spears a [age].o. 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  Following that hospitalization, [facility].  Per family, she was just discharged from there on 01/21/17 and since discharge, had been doing well until morning of 12/31 when she had dyspnea and hypoxia (SpO2 in 80s).  Family attempted chest PT manuevers (which they were coached on during previous admissions), but symptoms did not improve, therefore, she was brought to ED for further eval.  1/3- s/p KUB due to concern of abdominal distention; which revealed no bowel dilatation to suggest obstruction and there is no evidence of pneumoperitoneum, portal venous gas or pneumatosis. 1/4- per RN notes, rash found on rt upper thigh, CXR revealed rt base opacity 1/6- s/p lt hip ORIF  Pt  family member on phone at time of visit. Case discussed with RN, who reports pt continues to tolerate TF well. Discussed pt wt with RN; she reports she will obtain wt today.   Labs reviewed: Na: 131, CBGS: 125.   Diet Order:  No diet orders on file  EDUCATION NEEDS:   No education needs have been identified at this time  Skin:  Skin Assessment: Skin Integrity Issues: Skin Integrity Issues:: Incisions Incisions: lt leg  Last BM:  01/29/17  Height:   Ht Readings from Last 1 Encounters:  01/24/17 '5\' 2"'$  (1.575 m)    Weight:   Wt Readings from Last 1 Encounters:  01/24/17 102 lb (46.3 kg)    Ideal Body Weight:  50 kg  BMI:  Body mass index is 18.66 kg/m.  Estimated Nutritional Needs:   Kcal:  >2000 kcals  Protein:  > 100 grams protein  Fluid:  >2.0 L    Judy Spears A. WiJimmye NormanRD, LDN, CDE Pager: [phone] hours Pager: [phone]

## 2017-01-31 NOTE — Evaluation (Signed)
Physical Therapy Evaluation Patient Details Name: Judy Spears MRN: [id number] DOB: 19-Feb-1950 Today's Date: 01/31/2017   History of Present Illness  Pt is a 67 y.o. female with complex PMH significant for chronic respiratory failure s/p tracheostomy, s/p Peg tube andchronic encephalopathy secondary to [place] Virus, chronic R hip fractureand history of breast cancer who is brought in by husband due to concerns about persistent hypoxia at home on 01/24/2017. Pt with recent admission 11/11-12/11/18 for acute on chronic respiratory failure with subsequent D/C to rehabilitation at the [facility]) and was discharged home 01/21/17. During abdominal x-ray found to have comminuted peritrochanteric L proximal femur fracture. Now s/p ORIF 01/30/17.  Clinical Impression  Pt presents to PT with continued dependencies in mobility. Per caregiver was performing scoot transfer at DC from rehab. Pt now with lt hip ORIF, however RLE's extensor tone is much more limiting than LLE. Recommend return home with family/caregivers with current equipment. Per discussion with caregiver family with good set-up at home. Recommend with HHPT to transition back to OPPT.     Follow Up Recommendations Home health PT;Supervision/Assistance - 24 hour    Equipment Recommendations  Other (comment)(Consider lift equipment)    Recommendations for Other Services       Precautions / Restrictions Precautions Precautions: Fall Precaution Comments: trach, PEG  Restrictions Weight Bearing Restrictions: Yes LLE Weight Bearing: Partial weight bearing LLE Partial Weight Bearing Percentage or Pounds: 50%      Mobility  Bed Mobility Overal bed mobility: Needs Assistance Bed Mobility: Rolling;Supine to Sit;Sit to Supine Rolling: Total assist;+2 for physical assistance Sidelying to sit: +2 for physical assistance;Total assist Supine to sit: +2 for physical assistance;Total assist Sit to supine: +2 for  physical assistance;Total assist   General bed mobility comments: Assist for all aspects. Extensor tone in trunk and RLE make achieving sitting upright at EOB difficult.   Transfers                 General transfer comment: unsafe to attempt at this time  Ambulation/Gait             General Gait Details: non ambulatory  Stairs            Wheelchair Mobility    Modified Rankin (Stroke Patients Only)       Balance Overall balance assessment: Needs assistance Sitting-balance support: Feet supported Sitting balance-Leahy Scale: Poor Sitting balance - Comments: Sat EOB x 15 minutes with +2 total assist due to heavy posterior lean and RLE in extensor tone causing hips to push forward off EOB Postural control: Posterior lean;Left lateral lean                                   Pertinent Vitals/Pain Pain Assessment: Faces Faces Pain Scale: Hurts little more Pain Location: R LE; L LE Pain Descriptors / Indicators: Spasm;Restless;Grimacing Pain Intervention(s): Limited activity within patient's tolerance;Monitored during session;Repositioned    Home Living Family/patient expects to be discharged to:: Private residence Living Arrangements: Spouse/significant other Available Help at Discharge: Family;Personal care attendant;Available 24 hours/day Type of Home: House Home Access: Ramped entrance     Home Layout: Two level;Bed/bath upstairs Home Equipment: Hospital bed;Wheelchair - manual;Tub bench;Hand held shower head;Bedside commode;Walker - 2 wheels;Other (comment)(Stedy, wheelchair is tilt in space) Additional Comments: Information taken from previous admission and from caregiver     Prior Function Level of Independence: Needs assistance   Gait /  Transfers Assistance Needed: Has been completing lateral scoot transfers with assistance since D/C from inpatient rehab. Do have Stedy at home that they were previously using for transfers. W/c  dependent at baseline.   ADL's / Homemaking Assistance Needed: max assist with bathing, dressing        Hand Dominance   Dominant Hand: Right    Extremity/Trunk Assessment   Upper Extremity Assessment Upper Extremity Assessment: Defer to OT evaluation RUE Deficits / Details: Repetitive, rhythmic movement in bilateral hands. Very limited by tone this session. Able to achieve AAROM to ~0-70 degrees shoulder flexion. Functional AAROM of elbow. MCP contractures and unable to release tone in hand.  LUE Deficits / Details: Repetitive rhythmic movement in bilateral hands. Elbow flexor contracture limited to ~80 degrees extension this session (wears splint for this at night). Shoulder limited to ~0-90 degrees AAROM forward flexion. MPC contractures and uble to break tone this session.     Lower Extremity Assessment Lower Extremity Assessment: RLE deficits/detail;LLE deficits/detail RLE Deficits / Details: Pt with significant extensor tone in RLE. In sitting hip only ~ 60 degrees of flexion passively. Knee unable to achieve more than 10 degrees flexion passively. Hip abduction minimal passively. No significant active movement to command. RLE Coordination: decreased fine motor;decreased gross motor LLE Deficits / Details: Able to reach functional flexion/extension passively. Able to sit with hip/knee flexed LLE Coordination: decreased fine motor;decreased gross motor    Cervical / Trunk Assessment Cervical / Trunk Assessment: Other exceptions Cervical / Trunk Exceptions: Pt maintains flexed posture in bed.   Did not fully assess trunk due to hip pain/discomfort.  Head/neck rotated to Lt.  Able to achive ~2/3 AAROM neck rotation to th e Rt   Communication   Communication: Tracheostomy  Cognition Arousal/Alertness: Awake/alert Behavior During Therapy: [facility] for tasks assessed/performed Overall Cognitive Status: Difficult to assess                                 General Comments:  Pt mouthing words to questions concerning her family and did nod when asked questions at times. Following commands but perseverative.       General Comments      Exercises Other Exercises Other Exercises: Facilitated improved B UE AAROM in all planes with tone management strategies.  Other Exercises: Facilitated improved cervical AROM with visual tracking tasks.    Assessment/Plan    PT Assessment Patient needs continued PT services  PT Problem List Decreased strength;Decreased range of motion;Decreased activity tolerance;Decreased balance;Decreased mobility;Decreased coordination;Decreased cognition;Impaired tone       PT Treatment Interventions Functional mobility training;Therapeutic activities;Therapeutic exercise;Balance training;Patient/family education;DME instruction;Neuromuscular re-education;Cognitive remediation    PT Goals (Current goals can be found in the Care Plan section)  Acute Rehab PT Goals Patient Stated Goal: none stated Time For Goal Achievement: 02/14/17 Potential to Achieve Goals: Fair    Frequency Min 2X/week   Barriers to discharge        Co-evaluation PT/OT/SLP Co-Evaluation/Treatment: Yes Reason for Co-Treatment: Complexity of the patient's impairments (multi-system involvement);For patient/therapist safety PT goals addressed during session: Mobility/safety with mobility;Balance OT goals addressed during session: Strengthening/ROM       AM-PAC PT "6 Clicks" Daily Activity  Outcome Measure Difficulty turning over in bed (including adjusting bedclothes, sheets and blankets)?: Unable Difficulty moving from lying on back to sitting on the side of the bed? : Unable Difficulty sitting down on and standing up from a chair with arms (  e.g., wheelchair, bedside commode, etc,.)?: Unable Help needed moving to and from a bed to chair (including a wheelchair)?: Total Help needed walking in hospital room?: Total Help needed climbing 3-5 steps with a  railing? : Total 6 Click Score: 6    End of Session Equipment Utilized During Treatment: Oxygen(trach collar) Activity Tolerance: Patient tolerated treatment well Patient left: with call bell/phone within reach;with family/visitor present;in bed Nurse Communication: Mobility status;Need for lift equipment PT Visit Diagnosis: Other abnormalities of gait and mobility (R26.89);Muscle weakness (generalized) (M62.81);Other symptoms and signs involving the nervous system (Z32.992)    Time: 4268-3419 PT Time Calculation (min) (ACUTE ONLY): 37 min   Charges:   PT Evaluation $PT Eval Moderate Complexity: 1 Mod     PT G CodesMarland Kitchen        [facility] [place] 01/31/2017, 3:03 PM

## 2017-01-31 NOTE — Evaluation (Signed)
Occupational Therapy Evaluation Patient Details Name: Judy Spears MRN: [id number] DOB: 1950-04-11 Today's Date: 01/31/2017    History of Present Illness Pt is a 67 y.o. female with complex PMH significant for chronic respiratory failure s/p tracheostomy, s/p Peg tube andchronic encephalopathy secondary to [place] Virus, chronic R hip fractureand history of breast cancer who is brought in by husband due to concerns about persistent hypoxia at home on 01/24/2017. Pt with recent admission 11/11-12/11/18 for acute on chronic respiratory failure with subsequent D/C to rehabilitation at the [facility]) and was discharged home 01/21/17. During abdominal x-ray found to have comminuted peritrochanteric L proximal femur fracture. Now s/p ORIF 01/30/17.   Clinical Impression   Pt presents to OT this session requiring total assistance for bed mobility and ADL tasks at this time. Per caregiver report, since D/C from inpatient rehabilitation, pt has been able to complete lateral scoot transfers with significant assistance from bed to w/c. She was interactive during session following commands but perseverative with movement and able to make eye contact well. She additionally demonstrates significant tone in B UE L>R impacting functional participation in occupations. She would benefit from continued OT services while admitted to improve safety and ability to participate in daily tasks. Recommend D/C home with continued 24 hour assistance and home health OT follow-up as a precursor to return to outpatient OT.    Follow Up Recommendations  Home health OT;Supervision/Assistance - 24 hour    Equipment Recommendations  None recommended by OT    Recommendations for Other Services       Precautions / Restrictions Precautions Precautions: Fall Precaution Comments: trach, PEG  Restrictions Weight Bearing Restrictions: No      Mobility Bed Mobility Overal bed mobility: Needs  Assistance Bed Mobility: Rolling;Supine to Sit;Sit to Supine Rolling: Total assist;+2 for physical assistance   Supine to sit: +2 for physical assistance;Total assist Sit to supine: +2 for physical assistance;Total assist   General bed mobility comments: Total assistance to complete bed mobility this session with pt unable to assist.  Transfers                 General transfer comment: unsafe to attempt at this time    Balance Overall balance assessment: Needs assistance Sitting-balance support: Feet supported Sitting balance-Leahy Scale: Poor Sitting balance - Comments: L lateral lean at times. Total facilitation to maintain positioning.                                    ADL either performed or assessed with clinical judgement   ADL Overall ADL's : Needs assistance/impaired                                       General ADL Comments: Total assistance for ADL at this time.      Vision   Additional Comments: Pt will make eye contact and track therapist with smooth eye movements.      Perception     Praxis      Pertinent Vitals/Pain Pain Assessment: Faces Faces Pain Scale: Hurts little more Pain Location: R LE; L LE Pain Descriptors / Indicators: Spasm;Restless;Grimacing Pain Intervention(s): Limited activity within patient's tolerance;Repositioned;Monitored during session     Hand Dominance Right   Extremity/Trunk Assessment Upper Extremity Assessment Upper Extremity Assessment: RUE deficits/detail;LUE deficits/detail RUE Deficits /  Details: Repetitive, rhythmic movement in bilateral hands. Very limited by tone this session. Able to achieve AAROM to ~0-70 degrees shoulder flexion. Functional AAROM of elbow. MCP contractures and unable to release tone in hand.  LUE Deficits / Details: Repetitive rhythmic movement in bilateral hands. Elbow flexor contracture limited to ~80 degrees extension this session (wears splint for this at  night). Shoulder limited to ~0-90 degrees AAROM forward flexion. MPC contractures and uble to break tone this session.    Lower Extremity Assessment Lower Extremity Assessment: Defer to PT evaluation       Communication Communication Communication: Tracheostomy   Cognition Arousal/Alertness: Awake/alert Behavior During Therapy: WFL for tasks assessed/performed Overall Cognitive Status: Difficult to assess                                 General Comments: Pt mouthing words to questions concerning her family and did nod when asked questions at times. Following commands but perseverative.    General Comments       Exercises Other Exercises Other Exercises: Facilitated improved B UE AAROM in all planes with tone management strategies.  Other Exercises: Facilitated improved cervical AROM with visual tracking tasks.    Shoulder Instructions      Home Living Family/patient expects to be discharged to:: Private residence Living Arrangements: Spouse/significant other Available Help at Discharge: Family;Personal care attendant;Available 24 hours/day Type of Home: House Home Access: Ramped entrance     Home Layout: Two level;Bed/bath upstairs Alternate Level Stairs-Number of Steps: chair lift   Bathroom Shower/Tub: Walk-in shower   Bathroom Toilet: Handicapped height Bathroom Accessibility: Yes How Accessible: Accessible via wheelchair;Accessible via walker [facility] bed;Wheelchair - manual;Tub bench;Hand held shower head;Bedside commode;Walker - 2 wheels   Additional Comments: Information taken from previous admission.       Prior Functioning/Environment Level of Independence: Needs assistance  Gait / Transfers Assistance Needed: Has been completing lateral scoot transfers with assistance since D/C from inpatient rehab. Do have Stedy at home that they were previously using for transfers. W/c dependent at baseline.  ADL's / Homemaking Assistance  Needed: max assist with bathing, dressing Communication / Swallowing Assistance Needed: trach          OT Problem List: Decreased strength;Decreased activity tolerance;Impaired vision/perception;Decreased cognition;Decreased knowledge of use of DME or AE;Cardiopulmonary status limiting activity;Impaired tone;Impaired UE functional use;Decreased range of motion;Impaired balance (sitting and/or standing);Decreased safety awareness;Decreased knowledge of precautions      OT Treatment/Interventions: Self-care/ADL training;Therapeutic exercise;Neuromuscular education;Energy conservation;Manual therapy;Modalities;Splinting;DME and/or AE instruction;Therapeutic activities;Patient/family education;Visual/perceptual remediation/compensation;Cognitive remediation/compensation;Balance training    OT Goals(Current goals can be found in the care plan section) Acute Rehab OT Goals Patient Stated Goal: none stated OT Goal Formulation: With patient/family Time For Goal Achievement: 02/14/17 Potential to Achieve Goals: Fair ADL Goals Pt Will Transfer to Toilet: with max assist;with +2 assist;bedside commode Additional ADL Goal #1: Pt will maintain midline seated position with max assist in preparation for ADL participation. Additional ADL Goal #2: Pt will visually identify 3/4 objects in R visual field with head turns to the R. Additional ADL Goal #3: Family/caregiver will demonstrate appropriate positioning to minimize risk of skin breakdown during ADL participation.  OT Frequency: Min 2X/week   Barriers to D/C:            Co-evaluation PT/OT/SLP Co-Evaluation/Treatment: Yes Reason for Co-Treatment: Complexity of the patient's impairments (multi-system involvement);For patient/therapist safety PT goals addressed during session: Mobility/safety with mobility OT  goals addressed during session: Strengthening/ROM      AM-PAC PT "6 Clicks" Daily Activity     Outcome Measure Help from another person  eating meals?: Total Help from another person taking care of personal grooming?: Total Help from another person toileting, which includes using toliet, bedpan, or urinal?: Total Help from another person bathing (including washing, rinsing, drying)?: Total Help from another person to put on and taking off regular upper body clothing?: Total Help from another person to put on and taking off regular lower body clothing?: Total 6 Click Score: 6   End of Session Equipment Utilized During Treatment: Oxygen Nurse Communication: Mobility status  Activity Tolerance: Patient tolerated treatment well Patient left: in bed;with family/visitor present  OT Visit Diagnosis: Muscle weakness (generalized) (M62.81);Pain Pain - Right/Left: Left Pain - part of body: Hip                Time: 6578-4696 OT Time Calculation (min): 47 min Charges:  OT General Charges $OT Visit: 1 Visit OT Evaluation $OT Eval Moderate Complexity: 1 Mod G-Codes:     Norman Herrlich, MS OTR/L  Pager: [place]von[place] Tango 01/31/2017, 2:39 PM

## 2017-01-31 NOTE — Progress Notes (Signed)
Name: Judy Spears MRN: [id number] DOB: 1950-08-28    ADMISSION DATE:  01/24/2017 CONSULTATION DATE: 12/31  REFERRING MD : [facility]  CHIEF COMPLAINT: acute on chronic respiratory failure/HCAP   67 y.o.femalewith a PMHrelevant forchronic respiratory failure s/p tracheostomy, s/p Peg tube andchronic encephalopathy secondary to [place] Virusand history of breast cancer who is brought in by husband due to concerns about persistent hypoxia at home on 01/24/2017.Patient wasrecently admitted11/11/18 through 01/04/17 foracute on chronic respiratory failure,Following that hospitalization, she was transferred to [facility] for in hospital rehab. Per family, she was just discharged from there on 01/21/17 and since discharge, had been doing well until morning of 12/31/18when she had dyspnea and hypoxia (SpO2 in 80s). Family attempted chest PT manuevers (which they were coached on during previous admissions), but symptoms did not improve, therefore, she was brought to ED for further eval.  Family felt that she might have had mucous plugging at home. In ED, RT was able to suction several plugs out after saline lavage. Patient was  seen at bedside 01/25/2017 by  Dr. Lake Bells who is very familiar with patient.The patient has a history of MRSA and Pseudomonas respiratory infections, thereforen coverage for both organisms was initiated 01/25/2017 with vanc and ceftazidime.   EVENTS 01/28/17 - 28% ATC  With increased flow for increased humidity. Last plug noted /02/2017. Low grade fever    SUBJECTIVE:  To OR yesterday for ORIF L hip/femur fx.  Afebrile.  Less secretions.    VITAL SIGNS: Temp:  [98.8 F (37.1 C)-99.3 F (37.4 C)] 98.8 F (37.1 C) (01/07 0408) Pulse Rate:  [88-100] 94 (01/07 1157) Resp:  [21-40] 30 (01/07 1157) BP: (125-147)/(71-99) 127/80 (01/07 1157) SpO2:  [91 %-100 %] 93 % (01/07 1157) FiO2 (%):  [21 %-28 %] 21 % (01/07 0756)  PHYSICAL  EXAMINATION: General:  Chronically ill appearing, contracted female, NAD, working with PT so unable to do complete assessment Neuro:  Awake, functional quadriplegia, does not follow commands, tracks intermittently  HEENT:  Mm moist, no JVD, ATC Cardiovascular:  NSR  Lungs:  resps even non labored on ATC Abdomen:  Non tender  Musculoskeletal:  Warm and dry, contracted   Recent Labs  Lab 01/29/17 0237 01/30/17 0733 01/30/17 1338 01/31/17 0340  NA 132* 126*  --  131*  K 4.1 4.0  --  4.2  CL 95* 87*  --  93*  CO2 28 25  --  28  BUN 13 11  --  18  CREATININE <0.30* <0.30* 0.30* 0.31*  GLUCOSE 140* 95  --  85   Recent Labs  Lab 01/30/17 0733 01/30/17 1338 01/31/17 0340  HGB 12.2 11.3* 9.7*  HCT 36.3 35.6* 31.3*  WBC 12.4* 13.2* 9.9  PLT 474* 428* 460*   Dg C-arm 1-60 Min  Result Date: 01/30/2017 CLINICAL DATA:  ORIF left hip fracture. EXAM: DG C-ARM 61-120 MIN; OPERATIVE LEFT HIP WITH PELVIS COMPARISON:  None. FINDINGS: Intraoperative views of both hips are submitted postoperatively for interpretation. Internal nail and screw fixation of the proximal left femur noted traversing a intertrochanteric fracture, in near-anatomic alignment and position. A single view of the right hip demonstrates an apparent subcapital fracture of the right femur with superior displacement/shortening and rotation of the femoral head. IMPRESSION: 1. ORIF left femur fracture. 2. Apparent right subcapital right femur fracture with shortening and rotation of the femoral head. Electronically Signed   By: Margarette Canada M.D.   On: 01/30/2017 11:25   Dg Hip Operative  Unilat W Or W/o Pelvis Left  Result Date: 01/30/2017 CLINICAL DATA:  ORIF left hip fracture. EXAM: DG C-ARM 61-120 MIN; OPERATIVE LEFT HIP WITH PELVIS COMPARISON:  None. FINDINGS: Intraoperative views of both hips are submitted postoperatively for interpretation. Internal nail and screw fixation of the proximal left femur noted traversing a  intertrochanteric fracture, in near-anatomic alignment and position. A single view of the right hip demonstrates an apparent subcapital fracture of the right femur with superior displacement/shortening and rotation of the femoral head. IMPRESSION: 1. ORIF left femur fracture. 2. Apparent right subcapital right femur fracture with shortening and rotation of the femoral head. Electronically Signed   By: Margarette Canada M.D.   On: 01/30/2017 11:25    ASSESSMENT / PLAN:   Resp culture >> consistent with normal respiratory flora Resp viral panel negative Blood Cx 12/31>> + Staph species Methicillin Resistant in 1 of 2 cultures, ? Contaminant    Acute on chronic hypoxic respiratory failure - r/t HCAP, acute tracheobronchitis. 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 Respiratory status improved. Secretions less. S/p abx x 7 days.   PLAN -  S/p7 day course ceftaz Continue aggressive pulmonary hygiene  Continue ATC as tolerated  PRN trach suctioning  Mobilize as able  Speech following   Continue disposition planning.  Suspect that she will need some level of support that allows for intermittent suctioning, chest physiotherapy, heated humidity. Family prefers home with [facility].   Nickolas Madrid, NP 01/31/2017  12:03 PM Pager: [phone] or [phone]

## 2017-01-31 NOTE — Progress Notes (Signed)
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  In the hospital, lactate 2.09, CXR showed persistent left lower lobe opacity; case was discussed with Pulmonology who recommended vancomycin and ceftazidime and inpatient admission for presumed HCAP.       Assessment & Plan:  Principal Problem:   Acute tracheobronchitis Active Problems:   Encephalitis, arthropod-borne viral   Tracheostomy status (HCC)   Spastic tetraplegia (HCC)   Chronic respiratory failure (HCC)   Tracheobronchitis   PEG (percutaneous endoscopic gastrostomy) status (HCC)   Benign essential HTN   Hyponatremia   Hypokalemia   Chronic mucus hypersecretion, respiratory   Abdominal distension   Chronic right hip fracture Acute left hip fracture -Continue scheduled acetaminophen -Continue oxycodone 2.5-5 mg as needed, renal function good, will use low dose ibuprofen per family request as well -Consult to Orthopedics -PT per Ortho -Family have preference to go home with HHPT; other nursing services they already have in place; another alternative may be [facility] for Rehab at [facility] care associated pneumonia vs tracheobronchitis Acute hypoxic respiratory failure Treatment with ceftazidime 7 days, complete.  Less suctioning last 48 hours.  CoNS in 1/2 blood cultures, likely contaminant.  Urine culture with multiple species.  No fever this hospitalization. -Consultation to Pulmonology, appreciate cares  Positive blood culture Has no indwelling lines, not immune suppressed.  CoNS in 1/2 blood cultures, likely contaminant.  Chronic respiratory  failure with tracheostomy -Trach care per Pulmonology -Continue mucolytics, chest PT, bronchodilators  Hyponatremia Improved. 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  Hypertension -Continue amlodipine     DVT prophylaxis: Heparin Code Status: FULL Family Communication: Husband Disposition Plan: Trend Na, Hgb, Cr.  If stable, plan for discharge in 1-2 days.   Consultants:   Pulmonology  Orthopedics  Procedures:   None  Antimicrobials:   Vancomycin 12/31 >> 1/4  Ceftazidime 12/31 >> 1/6   Subjective: OR successfully yesterday.  Today, doing well.  Belly less distended.  Less suctioning, no new fever.     Objective: Vitals:   01/31/17 0200 01/31/17 0358 01/31/17 0408 01/31/17 0756  BP: (!) 147/88  125/71 (!) 142/89  Pulse: 100  92 92  Resp: (!) 37  (!) 22 (!) 21  Temp:   98.8 F (37.1 C)   TempSrc:   Axillary   SpO2: 99% 98% 93% 99%  Weight:      Height:        Intake/Output Summary (Last 24 hours) at 01/31/2017 0843 Last data filed at 01/31/2017 0409 Gross per 24 hour  Intake 2308 ml  Output 1350 ml  Net 958 ml   Filed Weights   01/24/17 0941  Weight: 46.3 kg (102 lb)    Examination: General appearance: Frail quadruplegic adult female, awake, not alert.     HEENT: Anicteric, conjunctiva pink, lids and lashes normal. No nasal deformity, epistaxis.   Lips moist.  Trach in place.   Skin: Warm and moist, flushed.  No jaundice.  No rash right side. Cardiac: Tachycardic, regular, no murmurs.   Respiratory: Tachypneic.  Trach with collar.  No wheezes.  Lungs clear, breathing shallow, limits exam. Abdomen: Abdomen soft but slightly distended, less than yesterday, no grimace to palpation, no masses appreciated.  MSK:  Spastic quadruplegia.    Neuro: Awake, spastic quadruplegia, makes eye contact occasionally.   Psych: Unable to assess    Data Reviewed: I have personally reviewed following labs and imaging studies:  CBC: Recent Labs  Lab 01/24/17 1011  01/28/17 1340 01/29/17 0237 01/30/17 0733 01/30/17 1338 01/31/17 0340  WBC 14.9*   < > 12.5* 9.4 12.4* 13.2* 9.9  NEUTROABS 12.7*  --  9.3*  --   --   --   --   HGB 12.0   < > 10.8* 10.8* 12.2 11.3* 9.7*  HCT 37.0   < > 33.1* 34.4* 36.3 35.6* 31.3*  MCV 90.7   < > 89.9 89.6 89.9 90.4 89.7  PLT 248   < > 321 355 474* 428* 460*   < > = values in this interval not displayed.   Basic Metabolic Panel: Recent Labs  Lab 01/27/17 0231 01/28/17 1340 01/29/17 0237 01/30/17 0733 01/30/17 1338 01/31/17 0340  NA 131* 129* 132* 126*  --  131*  K 4.2 4.4 4.1 4.0  --  4.2  CL 98* 93* 95* 87*  --  93*  CO2 25 27 28 25   --  28  GLUCOSE 128* 84 140* 95  --  85  BUN 10 12 13 11   --  18  CREATININE <0.30* <0.30* <0.30* <0.30* 0.30* 0.31*  CALCIUM 8.2* 8.7* 8.5* 9.2  --  8.7*  MG  --   --  2.0  --   --   --    GFR: Estimated Creatinine Clearance: 51.2 mL/min (A) (by C-G formula based on SCr of 0.31 mg/dL (L)). Liver Function Tests: Recent Labs  Lab 01/24/17 1011 01/27/17 0231 01/29/17 0237  AST 53* 45* 43*  ALT 25 64* 54  ALKPHOS 113 114 127*  BILITOT 0.9 0.8 0.8  PROT 6.2* 5.8* 5.8*  ALBUMIN 3.1* 2.6* 2.6*   No results for input(s): LIPASE, AMYLASE in the last 168 hours. No results for input(s): AMMONIA in the last 168 hours. Coagulation Profile: Recent Labs  Lab 01/24/17 1011 01/30/17 0733  INR 1.08 1.07   Cardiac Enzymes: No results for input(s): CKTOTAL, CKMB, CKMBINDEX, TROPONINI in the last 168 hours. BNP (last 3 results) No results for input(s): PROBNP in the last 8760 hours. HbA1C: No results for input(s): HGBA1C in the last 72 hours. CBG: Recent Labs  Lab 01/30/17 1059  GLUCAP 125*   Lipid Profile: No results for  input(s): CHOL, HDL, LDLCALC, TRIG, CHOLHDL, LDLDIRECT in the last 72 hours. Thyroid Function Tests: No results for input(s): TSH, T4TOTAL, FREET4, T3FREE, THYROIDAB in the last 72 hours. Anemia Panel: No results for input(s): VITAMINB12, FOLATE, FERRITIN, TIBC, IRON, RETICCTPCT in the last 72 hours. Urine analysis:    Component Value Date/Time   COLORURINE YELLOW 01/24/2017 1257   APPEARANCEUR HAZY (A) 01/24/2017 1257   LABSPEC 1.017 01/24/2017 1257   PHURINE 6.0 01/24/2017 1257   GLUCOSEU 50 (A) 01/24/2017 1257   HGBUR NEGATIVE 01/24/2017 1257   [place] 01/24/2017 1257   KETONESUR NEGATIVE 01/24/2017 1257   PROTEINUR 30 (A) 01/24/2017 1257   NITRITE NEGATIVE 01/24/2017 1257   LEUKOCYTESUR NEGATIVE 01/24/2017 1257   Sepsis Labs: @LABRCNTIP (procalcitonin:4,lacticacidven:4)  ) Recent Results (from the past 240  hour(s))  Culture, respiratory (NON-Expectorated)     Status: None   Collection Time: 01/24/17 10:19 AM  Result Value Ref Range Status   Specimen Description TRACHEAL ASPIRATE  Final   Special Requests NONE  Final   Gram Stain   Final    MODERATE WBC PRESENT, PREDOMINANTLY PMN ABUNDANT GRAM NEGATIVE RODS ABUNDANT GRAM POSITIVE COCCI ABUNDANT GRAM POSITIVE RODS    Culture Consistent with normal respiratory flora.  Final   Report Status 01/26/2017 FINAL  Final  Respiratory Panel by PCR     Status: None   Collection Time: 01/24/17 11:40 AM  Result Value Ref Range Status   Adenovirus NOT DETECTED NOT DETECTED Final   Coronavirus 229E NOT DETECTED NOT DETECTED Final   Coronavirus HKU1 NOT DETECTED NOT DETECTED Final   Coronavirus NL63 NOT DETECTED NOT DETECTED Final   Coronavirus OC43 NOT DETECTED NOT DETECTED Final   Metapneumovirus NOT DETECTED NOT DETECTED Final   Rhinovirus / Enterovirus NOT DETECTED NOT DETECTED Final   Influenza A NOT DETECTED NOT DETECTED Final   Influenza B NOT DETECTED NOT DETECTED Final   Parainfluenza Virus 1 NOT DETECTED NOT  DETECTED Final   Parainfluenza Virus 2 NOT DETECTED NOT DETECTED Final   Parainfluenza Virus 3 NOT DETECTED NOT DETECTED Final   Parainfluenza Virus 4 NOT DETECTED NOT DETECTED Final   Respiratory Syncytial Virus NOT DETECTED NOT DETECTED Final   Bordetella pertussis NOT DETECTED NOT DETECTED Final   Chlamydophila pneumoniae NOT DETECTED NOT DETECTED Final   Mycoplasma pneumoniae NOT DETECTED NOT DETECTED Final  Culture, blood (Routine x 2)     Status: Abnormal   Collection Time: 01/24/17 11:45 AM  Result Value Ref Range Status   Specimen Description BLOOD RIGHT FOREARM  Final   Special Requests   Final    BOTTLES DRAWN AEROBIC AND ANAEROBIC Blood Culture adequate volume   Culture  Setup Time   Final    GRAM POSITIVE COCCI IN CLUSTERS AEROBIC BOTTLE ONLY CRITICAL RESULT CALLED TO, READ BACK BY AND VERIFIED WITH: PHARMD B MANCHERIL [id number] MLM    Culture (A)  Final    STAPHYLOCOCCUS SPECIES (COAGULASE NEGATIVE) THE SIGNIFICANCE OF ISOLATING THIS ORGANISM FROM A SINGLE SET OF BLOOD CULTURES WHEN MULTIPLE SETS ARE DRAWN IS UNCERTAIN. PLEASE NOTIFY THE MICROBIOLOGY DEPARTMENT WITHIN ONE WEEK IF SPECIATION AND SENSITIVITIES ARE REQUIRED.    Report Status 01/26/2017 FINAL  Final  Blood Culture ID Panel (Reflexed)     Status: Abnormal   Collection Time: 01/24/17 11:45 AM  Result Value Ref Range Status   Enterococcus species NOT DETECTED NOT DETECTED Final   Listeria monocytogenes NOT DETECTED NOT DETECTED Final   Staphylococcus species DETECTED (A) NOT DETECTED Final    Comment: Methicillin (oxacillin) resistant coagulase negative staphylococcus. Possible blood culture contaminant (unless isolated from more than one blood culture draw or clinical case suggests pathogenicity). No antibiotic treatment is indicated for blood  culture contaminants. CRITICAL RESULT CALLED TO, READ BACK BY AND VERIFIED WITH: PHARMD B MANCHERIL [id number] MLM    Staphylococcus aureus NOT DETECTED NOT  DETECTED Final   Methicillin resistance DETECTED (A) NOT DETECTED Final    Comment: CRITICAL RESULT CALLED TO, READ BACK BY AND VERIFIED WITH: PHARMD B MANCHERIL [id number] MLM    Streptococcus species NOT DETECTED NOT DETECTED Final   Streptococcus agalactiae NOT DETECTED NOT DETECTED Final   Streptococcus pneumoniae NOT DETECTED NOT DETECTED Final   Streptococcus pyogenes NOT DETECTED NOT DETECTED Final   Acinetobacter baumannii  NOT DETECTED NOT DETECTED Final   Enterobacteriaceae species NOT DETECTED NOT DETECTED Final   Enterobacter cloacae complex NOT DETECTED NOT DETECTED Final   Escherichia coli NOT DETECTED NOT DETECTED Final   Klebsiella oxytoca NOT DETECTED NOT DETECTED Final   Klebsiella pneumoniae NOT DETECTED NOT DETECTED Final   Proteus species NOT DETECTED NOT DETECTED Final   Serratia marcescens NOT DETECTED NOT DETECTED Final   Haemophilus influenzae NOT DETECTED NOT DETECTED Final   Neisseria meningitidis NOT DETECTED NOT DETECTED Final   Pseudomonas aeruginosa NOT DETECTED NOT DETECTED Final   Candida albicans NOT DETECTED NOT DETECTED Final   Candida glabrata NOT DETECTED NOT DETECTED Final   Candida krusei NOT DETECTED NOT DETECTED Final   Candida parapsilosis NOT DETECTED NOT DETECTED Final   Candida tropicalis NOT DETECTED NOT DETECTED Final  Culture, blood (Routine x 2)     Status: None   Collection Time: 01/24/17 11:53 AM  Result Value Ref Range Status   Specimen Description BLOOD LEFT HAND  Final   Special Requests   Final    BOTTLES DRAWN AEROBIC AND ANAEROBIC Blood Culture adequate volume   Culture NO GROWTH 5 DAYS  Final   Report Status 01/29/2017 FINAL  Final  Urine culture     Status: Abnormal   Collection Time: 01/24/17  1:01 PM  Result Value Ref Range Status   Specimen Description URINE, CLEAN CATCH  Final   Special Requests Normal  Final   Culture MULTIPLE SPECIES PRESENT, SUGGEST RECOLLECTION (A)  Final   Report Status 01/25/2017 FINAL  Final   MRSA PCR Screening     Status: Abnormal   Collection Time: 01/26/17 12:47 PM  Result Value Ref Range Status   MRSA by PCR POSITIVE (A) NEGATIVE Final    Comment:        The GeneXpert MRSA Assay (FDA approved for NASAL specimens only), is one component of a comprehensive MRSA colonization surveillance program. It is not intended to diagnose MRSA infection nor to guide or monitor treatment for MRSA infections. RESULT CALLED TO, READ BACK BY AND VERIFIED WITH: H. Deaton RN 14:40 01/26/17 ([email or web address])   Surgical pcr screen     Status: Abnormal   Collection Time: 01/29/17  8:37 PM  Result Value Ref Range Status   MRSA, PCR POSITIVE (A) NEGATIVE Final    Comment: RESULT CALLED TO, READ BACK BY AND VERIFIED WITH: DECAREAUX,T RN 0034 01/30/17 MITCHELL,L    Staphylococcus aureus POSITIVE (A) NEGATIVE Final    Comment: (NOTE) The Xpert SA Assay (FDA approved for NASAL specimens in patients 56 years of age and older), is one component of a comprehensive surveillance program. It is not intended to diagnose infection nor to guide or monitor treatment.          Radiology Studies: Dg C-arm 1-60 Min  Result Date: 01/30/2017 CLINICAL DATA:  ORIF left hip fracture. EXAM: DG C-ARM 61-120 MIN; OPERATIVE LEFT HIP WITH PELVIS COMPARISON:  None. FINDINGS: Intraoperative views of both hips are submitted postoperatively for interpretation. Internal nail and screw fixation of the proximal left femur noted traversing a intertrochanteric fracture, in near-anatomic alignment and position. A single view of the right hip demonstrates an apparent subcapital fracture of the right femur with superior displacement/shortening and rotation of the femoral head. IMPRESSION: 1. ORIF left femur fracture. 2. Apparent right subcapital right femur fracture with shortening and rotation of the femoral head. Electronically Signed   By: Margarette Canada M.D.   On: 01/30/2017 11:25  Dg Hip Operative Unilat W Or W/o Pelvis  Left  Result Date: 01/30/2017 CLINICAL DATA:  ORIF left hip fracture. EXAM: DG C-ARM 61-120 MIN; OPERATIVE LEFT HIP WITH PELVIS COMPARISON:  None. FINDINGS: Intraoperative views of both hips are submitted postoperatively for interpretation. Internal nail and screw fixation of the proximal left femur noted traversing a intertrochanteric fracture, in near-anatomic alignment and position. A single view of the right hip demonstrates an apparent subcapital fracture of the right femur with superior displacement/shortening and rotation of the femoral head. IMPRESSION: 1. ORIF left femur fracture. 2. Apparent right subcapital right femur fracture with shortening and rotation of the femoral head. Electronically Signed   By: Margarette Canada M.D.   On: 01/30/2017 11:25        Scheduled Meds: . acetaminophen  500 mg Per Tube TID  . albuterol  2.5 mg Inhalation BID  . amantadine  150 mg Per Tube BID  . amLODipine  10 mg Per Tube Daily  . atorvastatin  20 mg Per Tube QHS  . baclofen  5 mg Per Tube QID  . bethanechol  5 mg Per Tube Daily  . bromocriptine  5 mg Oral Daily  . calcium-vitamin D  1 tablet Per Tube Q breakfast  . carbidopa-levodopa  2 tablet Per Tube TID BM  . chlorhexidine  15 mL Mouth/Throat BID  . cholecalciferol  2,000 Units Oral Daily  . enoxaparin (LOVENOX) injection  40 mg Subcutaneous Q24H  . feeding supplement (OSMOLITE 1.5 CAL)  1,560 mL Per Tube Daily  . feeding supplement (OSMOLITE 1.5 CAL)  237 mL Per Tube BID  . fluticasone  1 spray Each Nare Daily  . free water  200 mL Per Tube Q6H  . gabapentin  100 mg Per Tube BID  . guaiFENesin  10 mL Per Tube QID  . ketoconazole  1 application Topical BID  . loratadine  10 mg Per Tube Daily  . Melatonin  6 mg Per Tube QHS  . multivitamin  15 mL Per Tube Q0600  . mupirocin ointment  1 application Nasal BID  . pantoprazole sodium  40 mg Per Tube Daily  . polyvinyl alcohol  1 drop Both Eyes Daily  . primidone  50 mg Per Tube QID  .  saccharomyces boulardii  250 mg Per Tube BID  . senna  1 tablet Oral BID  . valACYclovir  500 mg Per Tube Daily  . vitamin C  500 mg Per Tube Daily   Continuous Infusions: . lactated ringers 50 mL/hr at 01/30/17 0823     LOS: 7 days    Time spent: 30 minutes    Edwin Dada, MD [facility] 01/31/2017, 8:43 AM     Pager [phone] --- please page though AMION:  [email or web address] Password [email or web address] If 7PM-7AM, please contact night-coverage

## 2017-02-01 ENCOUNTER — Ambulatory Visit: Payer: Medicare Other | Admitting: Occupational Therapy

## 2017-02-01 LAB — BASIC METABOLIC PANEL
Anion gap: 16 — ABNORMAL HIGH (ref 5–15)
BUN: 16 mg/dL (ref 6–20)
CHLORIDE: 92 mmol/L — AB (ref 101–111)
CO2: 24 mmol/L (ref 22–32)
CREATININE: 0.31 mg/dL — AB (ref 0.44–1.00)
Calcium: 9 mg/dL (ref 8.9–10.3)
GFR calc Af Amer: >60 mL/min (ref >60)
GFR calc non Af Amer: >60 mL/min (ref >60)
Glucose, Bld: 112 mg/dL — ABNORMAL HIGH (ref 65–99)
Potassium: 4.4 mmol/L (ref 3.5–5.1)
Sodium: 132 mmol/L — ABNORMAL LOW (ref 135–145)

## 2017-02-01 LAB — CBC
HEMATOCRIT: 32.5 % — AB (ref 36.0–46.0)
HEMOGLOBIN: 10.6 g/dL — AB (ref 12.0–15.0)
MCH: 29.2 pg (ref 26.0–34.0)
MCHC: 32.6 g/dL (ref 30.0–36.0)
MCV: 89.5 fL (ref 78.0–100.0)
Platelets: 439 10*3/uL — ABNORMAL HIGH (ref 150–400)
RBC: 3.63 MIL/uL — ABNORMAL LOW (ref 3.87–5.11)
RDW: 15.8 % — ABNORMAL HIGH (ref 11.5–15.5)
WBC: 12 10*3/uL — ABNORMAL HIGH (ref 4.0–10.5)

## 2017-02-01 NOTE — Progress Notes (Signed)
Patient's family member requested chest PT be held d/t patient being in pain from being moved and cleaned earlier. Will continue to monitor. RN aware.

## 2017-02-01 NOTE — Clinical Social Work Note (Signed)
CSW acknowledges SNF consult. PT and OT are recommending home health.   CSW signing off. Consult again if any other social work needs arise.  Hulon Ferron, CSW [phone]  

## 2017-02-01 NOTE — Progress Notes (Signed)
Orthopedic Tech Progress Note Patient Details:  Judy Spears August 11, 1950 [id number]  Patient ID: [name], female   DOB: [birth year], 67 y.o.   MRN: [id number]   [place]

## 2017-02-01 NOTE — Progress Notes (Addendum)
PROGRESS NOTE    Judy Spears  ZDG:[id number] DOB: [birth year] DOA: 01/24/2017 PCP: Marton Redwood, MD      Brief Narrative:  Judy Spears is a 66 yo F with history of chronic encephalopathy s/p Powassan virus, chronic respiratory failure with tracheostomy and PEG tube and chronic right hip fracture who was admitted on 12/31 for respiratory distress and hypoxia and increased secretions at home.  In the hospital, lactate 2.09, CXR showed persistent left lower lobe opacity; case was discussed with Pulmonology who recommended vancomycin and ceftazidime and inpatient admission for presumed HCAP.       Assessment & Plan:  Principal Problem:   Acute tracheobronchitis Active Problems:   Encephalitis, arthropod-borne viral   Tracheostomy status (HCC)   Spastic tetraplegia (HCC)   Chronic respiratory failure (HCC)   Tracheobronchitis   PEG (percutaneous endoscopic gastrostomy) status (HCC)   Benign essential HTN   Hyponatremia   Hypokalemia   Chronic mucus hypersecretion, respiratory   Abdominal distension   Chronic right hip fracture Acute left hip fracture -Discontinue dilaudid -Continue scheduled acetaminophen and PRN ibuprofen, oxydocone -Plan is for HHPT, discussed with Ortho who agree.  Ortho follow up arranged.  Ortho have arranged for 2 weeks SubQ Lovenox and oxycodone rx and follow up appointment  -Family have inquired about DEXA scanning as an outpatient, will plan on recommending this to be followed up by Dr. Brigitte Pulse at hospital follow up, as well as Ca+Vit D supplementation   Health care associated pneumonia vs tracheobronchitis Acute hypoxic respiratory failure Treatment with ceftazidime 7 days, complete.  Less suctioning last 72 hrs than on admission, but still more than baseline.  No new fever.    Positive blood culture Has no indwelling lines, not immune suppressed.  CoNS in 1/2 blood cultures, likely contaminant.  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  Hypertension -Continue amlodipine     DVT prophylaxis: Lovenox Code Status: FULL Family Communication: Husband Disposition Plan: Cr stable.  Still getting IV dilaudid in last 24 hrs; if able to control pain today with oral regimen, and suctioning is at home-manageable level, family expect discharge tomorrow with HHPT   Consultants:   Pulmonology  Orthopedics  Procedures:   None  Antimicrobials:   Vancomycin 12/31 >> 1/4  Ceftazidime 12/31 >> 1/6   Subjective: OR successfully, now POD2.  No belly distension, still more suctioning than baseline and on medical O2.  No new fever.   Objective: Vitals:   02/01/17 0800 02/01/17 0823 02/01/17 1136 02/01/17 1200  BP: (!) 156/90 (!) 156/90 103/62 (!) 106/56  Pulse: 95 98 95 91  Resp: (!) 35 (!) 30 (!) 30 (!) 31  Temp:      TempSrc:      SpO2: 97% 94% 99% 98%  Weight:      Height:        Intake/Output Summary (Last 24 hours) at 02/01/2017 1230 Last data filed at 02/01/2017 0700 Gross per 24 hour  Intake 638 ml  Output 1750 ml  Net -1112 ml   Filed Weights   01/24/17 0941  Weight: 46.3 kg (102 lb)  Examination: General appearance: Frail quadruplegic adult female, awake, not alert.     HEENT: Anicteric, conjunctiva pink, lids and lashes normal. No nasal deformity, epistaxis.   Lips moist.  Trach in place.   Skin: Warm and moist, flushed.  No jaundice.   No rash right side. Cardiac: Tachycardic, regular, no murmurs.     Respiratory: Tachypneic.  Trach with collar.  No wheezes.  Lungs clear, breathing shallow, limits exam. Abdomen: Abdomen soft but slightly distended, less than yesterday, no grimace to palpation, no masses appreciated.  MSK: Spastic quadruplegia.    Neuro: Awake, spastic quadruplegia, makes eye contact occasionally.   Psych: Unable to assess    Data Reviewed: I have personally reviewed following labs and imaging studies:  CBC: Recent Labs  Lab 01/28/17 1340 01/29/17 0237 01/30/17 0733 01/30/17 1338 01/31/17 0340 02/01/17 0556  WBC 12.5* 9.4 12.4* 13.2* 9.9 12.0*  NEUTROABS 9.3*  --   --   --   --   --   HGB 10.8* 10.8* 12.2 11.3* 9.7* 10.6*  HCT 33.1* 34.4* 36.3 35.6* 31.3* 32.5*  MCV 89.9 89.6 89.9 90.4 89.7 89.5  PLT 321 355 474* 428* 460* 423*   Basic Metabolic Panel: Recent Labs  Lab 01/28/17 1340 01/29/17 0237 01/30/17 0733 01/30/17 1338 01/31/17 0340 02/01/17 0556  NA 129* 132* 126*  --  131* 132*  K 4.4 4.1 4.0  --  4.2 4.4  CL 93* 95* 87*  --  93* 92*  CO2 27 28 25   --  28 24  GLUCOSE 84 140* 95  --  85 112*  BUN 12 13 11   --  18 16  CREATININE <0.30* <0.30* <0.30* 0.30* 0.31* 0.31*  CALCIUM 8.7* 8.5* 9.2  --  8.7* 9.0  MG  --  2.0  --   --   --   --    GFR: Estimated Creatinine Clearance: 51.2 mL/min (A) (by C-G formula based on SCr of 0.31 mg/dL (L)). Liver Function Tests: Recent Labs  Lab 01/27/17 0231 01/29/17 0237  AST 45* 43*  ALT 64* 54  ALKPHOS 114 127*  BILITOT 0.8 0.8  PROT 5.8* 5.8*  ALBUMIN 2.6* 2.6*   No results for input(s): LIPASE, AMYLASE in the last 168 hours. No results for input(s): AMMONIA in the last 168 hours. Coagulation Profile: Recent Labs  Lab 01/30/17 0733  INR 1.07   Cardiac Enzymes: No results for input(s): CKTOTAL, CKMB, CKMBINDEX, TROPONINI in the last 168 hours. BNP (last 3 results) No results for input(s): PROBNP in the last 8760 hours. HbA1C: No results for input(s): HGBA1C in the last 72  hours. CBG: Recent Labs  Lab 01/30/17 1059  GLUCAP 125*   Lipid Profile: No results for input(s): CHOL, HDL, LDLCALC, TRIG, CHOLHDL, LDLDIRECT in the last 72 hours. Thyroid Function Tests: No results for input(s): TSH, T4TOTAL, FREET4, T3FREE, THYROIDAB in the last 72 hours. Anemia Panel: No results for input(s): VITAMINB12, FOLATE, FERRITIN, TIBC, IRON, RETICCTPCT in the last 72 hours. Urine analysis:    Component Value Date/Time   COLORURINE YELLOW 01/24/2017 1257   APPEARANCEUR HAZY (A) 01/24/2017 1257   LABSPEC 1.017 01/24/2017 1257   PHURINE 6.0 01/24/2017 1257   GLUCOSEU 50 (A) 01/24/2017 1257   HGBUR NEGATIVE 01/24/2017 1257   BILIRUBINUR NEGATIVE 01/24/2017 1257   KETONESUR NEGATIVE 01/24/2017 1257   PROTEINUR 30 (A) 01/24/2017 1257   NITRITE NEGATIVE 01/24/2017 1257   LEUKOCYTESUR NEGATIVE 01/24/2017 1257   Sepsis Labs: @LABRCNTIP (procalcitonin:4,lacticacidven:4)  ) Recent Results (from  the past 240 hour(s))  Culture, respiratory (NON-Expectorated)     Status: None   Collection Time: 01/24/17 10:19 AM  Result Value Ref Range Status   Specimen Description TRACHEAL ASPIRATE  Final   Special Requests NONE  Final   Gram Stain   Final    MODERATE WBC PRESENT, PREDOMINANTLY PMN ABUNDANT GRAM NEGATIVE RODS ABUNDANT GRAM POSITIVE COCCI ABUNDANT GRAM POSITIVE RODS    Culture Consistent with normal respiratory flora.  Final   Report Status 01/26/2017 FINAL  Final  Respiratory Panel by PCR     Status: None   Collection Time: 01/24/17 11:40 AM  Result Value Ref Range Status   Adenovirus NOT DETECTED NOT DETECTED Final   Coronavirus 229E NOT DETECTED NOT DETECTED Final   Coronavirus HKU1 NOT DETECTED NOT DETECTED Final   Coronavirus NL63 NOT DETECTED NOT DETECTED Final   Coronavirus OC43 NOT DETECTED NOT DETECTED Final   Metapneumovirus NOT DETECTED NOT DETECTED Final   Rhinovirus / Enterovirus NOT DETECTED NOT DETECTED Final   Influenza A NOT DETECTED NOT DETECTED  Final   Influenza B NOT DETECTED NOT DETECTED Final   Parainfluenza Virus 1 NOT DETECTED NOT DETECTED Final   Parainfluenza Virus 2 NOT DETECTED NOT DETECTED Final   Parainfluenza Virus 3 NOT DETECTED NOT DETECTED Final   Parainfluenza Virus 4 NOT DETECTED NOT DETECTED Final   Respiratory Syncytial Virus NOT DETECTED NOT DETECTED Final   Bordetella pertussis NOT DETECTED NOT DETECTED Final   Chlamydophila pneumoniae NOT DETECTED NOT DETECTED Final   Mycoplasma pneumoniae NOT DETECTED NOT DETECTED Final  Culture, blood (Routine x 2)     Status: Abnormal   Collection Time: 01/24/17 11:45 AM  Result Value Ref Range Status   Specimen Description BLOOD RIGHT FOREARM  Final   Special Requests   Final    BOTTLES DRAWN AEROBIC AND ANAEROBIC Blood Culture adequate volume   Culture  Setup Time   Final    GRAM POSITIVE COCCI IN CLUSTERS AEROBIC BOTTLE ONLY CRITICAL RESULT CALLED TO, READ BACK BY AND VERIFIED WITH: PHARMD B MANCHERIL [id number] MLM    Culture (A)  Final    STAPHYLOCOCCUS SPECIES (COAGULASE NEGATIVE) THE SIGNIFICANCE OF ISOLATING THIS ORGANISM FROM A SINGLE SET OF BLOOD CULTURES WHEN MULTIPLE SETS ARE DRAWN IS UNCERTAIN. PLEASE NOTIFY THE MICROBIOLOGY DEPARTMENT WITHIN ONE WEEK IF SPECIATION AND SENSITIVITIES ARE REQUIRED.    Report Status 01/26/2017 FINAL  Final  Blood Culture ID Panel (Reflexed)     Status: Abnormal   Collection Time: 01/24/17 11:45 AM  Result Value Ref Range Status   Enterococcus species NOT DETECTED NOT DETECTED Final   Listeria monocytogenes NOT DETECTED NOT DETECTED Final   Staphylococcus species DETECTED (A) NOT DETECTED Final    Comment: Methicillin (oxacillin) resistant coagulase negative staphylococcus. Possible blood culture contaminant (unless isolated from more than one blood culture draw or clinical case suggests pathogenicity). No antibiotic treatment is indicated for blood  culture contaminants. CRITICAL RESULT CALLED TO, READ BACK BY AND  VERIFIED WITH: PHARMD B MANCHERIL [id number] MLM    Staphylococcus aureus NOT DETECTED NOT DETECTED Final   Methicillin resistance DETECTED (A) NOT DETECTED Final    Comment: CRITICAL RESULT CALLED TO, READ BACK BY AND VERIFIED WITH: PHARMD B MANCHERIL [id number] MLM    Streptococcus species NOT DETECTED NOT DETECTED Final   Streptococcus agalactiae NOT DETECTED NOT DETECTED Final   Streptococcus pneumoniae NOT DETECTED NOT DETECTED Final   Streptococcus pyogenes NOT DETECTED NOT DETECTED Final  Acinetobacter baumannii NOT DETECTED NOT DETECTED Final   Enterobacteriaceae species NOT DETECTED NOT DETECTED Final   Enterobacter cloacae complex NOT DETECTED NOT DETECTED Final   Escherichia coli NOT DETECTED NOT DETECTED Final   Klebsiella oxytoca NOT DETECTED NOT DETECTED Final   Klebsiella pneumoniae NOT DETECTED NOT DETECTED Final   Proteus species NOT DETECTED NOT DETECTED Final   Serratia marcescens NOT DETECTED NOT DETECTED Final   Haemophilus influenzae NOT DETECTED NOT DETECTED Final   Neisseria meningitidis NOT DETECTED NOT DETECTED Final   Pseudomonas aeruginosa NOT DETECTED NOT DETECTED Final   Candida albicans NOT DETECTED NOT DETECTED Final   Candida glabrata NOT DETECTED NOT DETECTED Final   Candida krusei NOT DETECTED NOT DETECTED Final   Candida parapsilosis NOT DETECTED NOT DETECTED Final   Candida tropicalis NOT DETECTED NOT DETECTED Final  Culture, blood (Routine x 2)     Status: None   Collection Time: 01/24/17 11:53 AM  Result Value Ref Range Status   Specimen Description BLOOD LEFT HAND  Final   Special Requests   Final    BOTTLES DRAWN AEROBIC AND ANAEROBIC Blood Culture adequate volume   Culture NO GROWTH 5 DAYS  Final   Report Status 01/29/2017 FINAL  Final  Urine culture     Status: Abnormal   Collection Time: 01/24/17  1:01 PM  Result Value Ref Range Status   Specimen Description URINE, CLEAN CATCH  Final   Special Requests Normal  Final   Culture  MULTIPLE SPECIES PRESENT, SUGGEST RECOLLECTION (A)  Final   Report Status 01/25/2017 FINAL  Final  MRSA PCR Screening     Status: Abnormal   Collection Time: 01/26/17 12:47 PM  Result Value Ref Range Status   MRSA by PCR POSITIVE (A) NEGATIVE Final    Comment:        The GeneXpert MRSA Assay (FDA approved for NASAL specimens only), is one component of a comprehensive MRSA colonization surveillance program. It is not intended to diagnose MRSA infection nor to guide or monitor treatment for MRSA infections. RESULT CALLED TO, READ BACK BY AND VERIFIED WITH: H. Deaton RN 14:40 01/26/17 ([email or web address])   Surgical pcr screen     Status: Abnormal   Collection Time: 01/29/17  8:37 PM  Result Value Ref Range Status   MRSA, PCR POSITIVE (A) NEGATIVE Final    Comment: RESULT CALLED TO, READ BACK BY AND VERIFIED WITH: DECAREAUX,T RN 0034 01/30/17 MITCHELL,L    Staphylococcus aureus POSITIVE (A) NEGATIVE Final    Comment: (NOTE) The Xpert SA Assay (FDA approved for NASAL specimens in patients 5 years of age and older), is one component of a comprehensive surveillance program. It is not intended to diagnose infection nor to guide or monitor treatment.          Radiology Studies: No results found.      Scheduled Meds: . acetaminophen  500 mg Per Tube TID  . albuterol  2.5 mg Inhalation BID  . amantadine  150 mg Per Tube BID  . amLODipine  10 mg Per Tube Daily  . atorvastatin  20 mg Per Tube QHS  . baclofen  5 mg Per Tube QID  . bethanechol  5 mg Per Tube Daily  . bromocriptine  5 mg Oral Daily  . calcium-vitamin D  1 tablet Per Tube Q breakfast  . carbidopa-levodopa  2 tablet Per Tube TID BM  . chlorhexidine  15 mL Mouth/Throat BID  . cholecalciferol  2,000 Units Oral Daily  . enoxaparin (  LOVENOX) injection  40 mg Subcutaneous Q24H  . feeding supplement (OSMOLITE 1.5 CAL)  1,560 mL Per Tube Daily  . feeding supplement (OSMOLITE 1.5 CAL)  237 mL Per Tube BID  . fluticasone  1  spray Each Nare Daily  . free water  200 mL Per Tube Q6H  . gabapentin  100 mg Per Tube BID  . guaiFENesin  10 mL Per Tube QID  . ketoconazole  1 application Topical BID  . loratadine  10 mg Per Tube Daily  . Melatonin  6 mg Per Tube QHS  . multivitamin  15 mL Per Tube Q0600  . pantoprazole sodium  40 mg Per Tube Daily  . polyvinyl alcohol  1 drop Both Eyes Daily  . primidone  50 mg Per Tube QID  . saccharomyces boulardii  250 mg Per Tube BID  . senna  1 tablet Oral BID  . valACYclovir  500 mg Per Tube Daily  . vitamin C  500 mg Per Tube Daily   Continuous Infusions: . lactated ringers 50 mL/hr at 01/30/17 0823     LOS: 8 days    Time spent: 20 minutes    Edwin Dada, MD [facility] 02/01/2017, 12:30 PM     Pager [phone] --- please page though AMION:  [email or web address] Password [email or web address] If 7PM-7AM, please contact night-coverage

## 2017-02-01 NOTE — Care Management Note (Signed)
Case Management Note  Patient Details  Name: Judy Spears MRN: [id number] Date of Birth: December 05, 1950  Subjective/Objective:                    Action/Plan:  PTA from home - recently discharged from [facility] to home with 24 hour care.  Pt travels via private vehicle driven by husband.  Pt has chronic trach - PTA on RA.  Discussed in LOS 02/02/17 - Pt placed in [place] with [place] - husband in agreement for [facility] and will discontinue outpt PT,OT, Speech Therapy.  Per attending pt will need humidified air (not actual oxygen) and Lovenox at discharge. Husband chose [facility] for DME - agency contacted and referral pending review as pt will not meet generic qualifications for home oxygen. Benefit check submitted for Lovenox 40mg  Daily for 14 days.      Expected Discharge Date:                  Expected Discharge Plan:  [place]  In-House Referral:  Clinical Social Work  Discharge planning Services  CM Consult  Post Acute Care Choice:    Choice offered to:     DME Arranged:    DME Agency:     HH Arranged:    [place] Agency:     Status of Service:  In process, will continue to follow  If discussed at Long Length of Stay Meetings, dates discussed:    Additional Comments:  Maryclare Labrador, RN 02/01/2017, 3:37 PM

## 2017-02-02 DIAGNOSIS — I10 Essential (primary) hypertension: Secondary | ICD-10-CM

## 2017-02-02 DIAGNOSIS — J9601 Acute respiratory failure with hypoxia: Secondary | ICD-10-CM

## 2017-02-02 DIAGNOSIS — J189 Pneumonia, unspecified organism: Secondary | ICD-10-CM

## 2017-02-02 DIAGNOSIS — E871 Hypo-osmolality and hyponatremia: Secondary | ICD-10-CM

## 2017-02-02 DIAGNOSIS — S72002D Fracture of unspecified part of neck of left femur, subsequent encounter for closed fracture with routine healing: Secondary | ICD-10-CM

## 2017-02-02 LAB — CBC
HCT: 30.7 % — ABNORMAL LOW (ref 36.0–46.0)
Hemoglobin: 9.4 g/dL — ABNORMAL LOW (ref 12.0–15.0)
MCH: 27.9 pg (ref 26.0–34.0)
MCHC: 30.6 g/dL (ref 30.0–36.0)
MCV: 91.1 fL (ref 78.0–100.0)
PLATELETS: 472 10*3/uL — AB (ref 150–400)
RBC: 3.37 MIL/uL — AB (ref 3.87–5.11)
RDW: 15.9 % — ABNORMAL HIGH (ref 11.5–15.5)
WBC: 9.1 10*3/uL (ref 4.0–10.5)

## 2017-02-02 MED ORDER — LORATADINE 10 MG PO TABS: 10.0000 mg | ORAL_TABLET | Freq: Every day | ORAL | Status: DC

## 2017-02-02 MED ORDER — POLYETHYLENE GLYCOL 3350 17 G PO PACK: 17.0000 g | PACK | Freq: Every day | ORAL | 0 refills | Status: DC | PRN

## 2017-02-02 MED ORDER — OSMOLITE 1.5 CAL PO LIQD: ORAL | Status: DC

## 2017-02-02 MED ORDER — GUAIFENESIN 100 MG/5ML PO SOLN: 10.0000 mL | Freq: Every day | ORAL | Status: DC | PRN

## 2017-02-02 MED ORDER — OXYCODONE HCL 5 MG PO TABS: 2.5000 mg | ORAL_TABLET | ORAL | 0 refills | Status: DC | PRN

## 2017-02-02 MED ORDER — OXYCODONE HCL 5 MG/5ML PO SOLN: 2.5000 mg | ORAL | Status: DC | PRN

## 2017-02-02 MED ORDER — SENNA 8.6 MG PO TABS: 1.0000 | ORAL_TABLET | Freq: Two times a day (BID) | ORAL | 0 refills | Status: DC

## 2017-02-02 MED ORDER — BACLOFEN 10 MG PO TABS: ORAL_TABLET | ORAL | Status: DC

## 2017-02-02 MED ORDER — OXYCODONE HCL 5 MG/5ML PO SOLN: 2.5000 mg | ORAL | 0 refills | Status: DC | PRN

## 2017-02-02 NOTE — Progress Notes (Signed)
Occupational Therapy Treatment Patient Details Name: Judy Spears MRN: [id number] DOB: 12-03-1950 Today's Date: 02/02/2017    History of present illness Pt is a 67 y.o. female with complex PMH significant for chronic respiratory failure s/p tracheostomy, s/p Peg tube andchronic encephalopathy secondary to [place]an Virus, chronic R hip fractureand history of breast cancer who is brought in by husband due to concerns about persistent hypoxia at home on 01/24/2017. Pt with recent admission 11/11-12/11/18 for acute on chronic respiratory failure with subsequent D/C to rehabilitation at the [facility]) and was discharged home 01/21/17. During abdominal x-ray found to have comminuted peritrochanteric L proximal femur fracture. Now s/p ORIF 01/30/17.   OT comments  Pt progressing towards OT goals this session. Focused on dressing (in prep for discharge) with caregiver education provided to [place] about sequencing, safety and new compensatory strategies. Pt communicated that she was in pain. RN aware and awaiting script from Ortho for pain medicine. At this time [facility] appropriate as they can work in home environment with family and caregivers to adapt their routines to accommodate new needs for safety of Pt and caregivers. Thank you for the opportunity to serve this patient.   Follow Up Recommendations  Home health OT;Supervision/Assistance - 24 hour    Equipment Recommendations  None recommended by OT    Recommendations for Other Services      Precautions / Restrictions Precautions Precautions: Fall Precaution Comments: trach, PEG  Restrictions Weight Bearing Restrictions: Yes LLE Weight Bearing: Partial weight bearing LLE Partial Weight Bearing Percentage or Pounds: 50%       Mobility Bed Mobility Overal bed mobility: Needs Assistance Bed Mobility: Rolling;Supine to Sit;Sit to Supine Rolling: Total assist;+2 for physical assistance   Supine to sit: +2 for physical  assistance;Total assist Sit to supine: +2 for physical assistance;Total assist   General bed mobility comments: Assist for all aspects. Extensor tone in trunk and RLE make achieving sitting upright at EOB difficult.   Transfers                 General transfer comment: Transfer not attempted at this time.    Balance Overall balance assessment: Needs assistance Sitting-balance support: Feet supported Sitting balance-Leahy Scale: Zero Sitting balance - Comments: Sat EOB With total +2 assist for UB dressing Postural control: Posterior lean;Left lateral lean                                 ADL either performed or assessed with clinical judgement   ADL Overall ADL's : Needs assistance/impaired                 Upper Body Dressing : Total assistance;Sitting Upper Body Dressing Details (indicate cue type and reason): +2 assist sitting EOB Lower Body Dressing: Total assistance;Bed level Lower Body Dressing Details (indicate cue type and reason): for adult briefs, pants, socks               General ADL Comments: Education provided for caregiver Judy Spears) about sequencing for dressing and new compensatory strategies     Vision       Perception     Praxis      Cognition Arousal/Alertness: Awake/alert Behavior During Therapy: WFL for tasks assessed/performed Overall Cognitive Status: Difficult to assess  General Comments: communicating through nodding and shaking head        Exercises     Shoulder Instructions       General Comments Education provided for caregiver Judy Spears after discussion about new dressing routine at home - encouraged HHOT so that the therapists can see them function in their own environment    Pertinent Vitals/ Pain       Pain Assessment: Faces Faces Pain Scale: Hurts whole lot Pain Location: R LE; L LE Pain Descriptors / Indicators: Spasm;Restless;Grimacing Pain  Intervention(s): Limited activity within patient's tolerance;Repositioned;Patient requesting pain meds-RN notified  Home Living                                          Prior Functioning/Environment              Frequency  Min 2X/week        Progress Toward Goals  OT Goals(current goals can now be found in the care plan section)  Progress towards OT goals: Progressing toward goals  Acute Rehab OT Goals Patient Stated Goal: none stated OT Goal Formulation: With patient/family Time For Goal Achievement: 02/14/17 Potential to Achieve Goals: [place] Discharge plan remains appropriate    Co-evaluation                 AM-PAC PT "6 Clicks" Daily Activity     Outcome Measure   Help from another person eating meals?: Total Help from another person taking care of personal grooming?: Total Help from another person toileting, which includes using toliet, bedpan, or urinal?: Total Help from another person bathing (including washing, rinsing, drying)?: Total Help from another person to put on and taking off regular upper body clothing?: Total Help from another person to put on and taking off regular lower body clothing?: Total 6 Click Score: 6    End of Session Equipment Utilized During Treatment: Oxygen  OT Visit Diagnosis: Muscle weakness (generalized) (M62.81);Pain Pain - Right/Left: Left Pain - part of body: Hip   Activity Tolerance Patient tolerated treatment well   Patient Left in bed;with family/visitor present   Nurse Communication Mobility status        Time: 1440-1505 OT Time Calculation (min): 25 min  Charges: OT General Charges $OT Visit: 1 Visit OT Treatments $Self Care/Home Management : 23-37 mins  Hulda Humphrey OTR/L [place] 02/02/2017, 4:14 PM

## 2017-02-02 NOTE — Discharge Summary (Addendum)
Physician Discharge Summary  Judy Spears Xxxhagan DPO:[id number] DOB: 10-16-1950  PCP: Judy Redwood, MD  Admit date: 01/24/2017 Discharge date: 02/02/2017  Recommendations for Outpatient Follow-up:  1. Dr. Marton Spears, PCP in one week with repeat labs (CBC & BMP). 2. Dr. Paralee Spears, Orthopedics in 2 weeks.  Home Health: PT, OT, SLP, RN, home health aide and respiratory care. Equipment/Devices: Hoyer lift and humidified air   Discharge Condition: Improved and stable  CODE STATUS: Full  Diet recommendation: NPO. Continue tube feeds as prior to admission.  Discharge Diagnoses:  Principal Problem:   Acute tracheobronchitis Active Problems:   Encephalitis, arthropod-borne viral   Tracheostomy status (HCC)   Spastic tetraplegia (HCC)   Chronic respiratory failure (HCC)   Tracheobronchitis   PEG (percutaneous endoscopic gastrostomy) status (HCC)   Benign essential HTN   Hyponatremia   Hypokalemia   Chronic mucus hypersecretion, respiratory   Abdominal distension   Brief Summary: [name] is a 67 yo F with history of chronic encephalopathy s/p [place] virus, chronic respiratory failure with tracheostomy and PEG tube and chronic right hip fracture who was admitted on 12/31 for respiratory distress and hypoxia and increased secretions at home.  In the hospital, lactate 2.09, CXR showed persistent left lower lobe opacity; case was discussed with Pulmonology who recommended vancomycin and ceftazidime and inpatient admission for presumed HCAP.   Assessment & Plan:   Chronic right hip fracture Acute left hip fracture - S/P ORIF 01/30/17 - As per orthopedic follow-up 1/7: Weightbearing as tolerated on left lower extremity and right lower extremity for transfers, return to clinic in 2 weeks, Lovenox for 2 weeks for postop DVT prophylaxis and Aquacel dressing in place to be removed during first office visit with him. - Not requiring IV pain medications over the last 24 hours.  Pain controlled with scheduled acetaminophen and PRN ibuprofen, oxydocone - Plan is for HHPT which was discussed with Ortho who agree.  Ortho have arranged for 2 weeks SubQ Lovenox and oxycodone rx and follow up appointment -Family have inquired about DEXA scanning as an outpatient, will plan on recommending this to be followed up by Dr. Brigitte Spears at hospital follow up, as well as Ca+Vit D supplementation   Health care associated pneumonia vs tracheobronchitis Acute hypoxic respiratory failure Treatment with ceftazidime 7 days, completed.  Tracheal cultures showed normal flora. - Not much secretions reported by RN or family. - Currently on trach collar with 21% FiO2 - Hypoxia resolved.  Positive blood culture Has no indwelling lines, not immune suppressed.  CoNS in 1/2 blood cultures, likely contaminant.  Chronic respiratory failure with tracheostomy -Trach care per Pulmonology -Continue home mucolytics, chest PT, bronchodilators - As above.  Hyponatremia Stable. Follow BMP in a few days as outpatient.  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 Verified PEG tube water with caregiver and as below.  Anemia of chronic disease Appropriate drop. Stable.  Hypertension -Continue amlodipine. Controlled      Consultants:   Pulmonology  Orthopedics  Procedures:   ORIF left hip 1/6.   Discharge Instructions  Discharge Instructions    Call MD for:  difficulty breathing, headache or visual disturbances   Complete by:  As directed    Call MD for:  extreme fatigue   Complete by:  As directed    Call MD for:  persistant dizziness or light-headedness   Complete by:  As directed  Call MD for:  redness, tenderness, or signs of infection (pain, swelling, redness, odor or green/yellow discharge  around incision site)   Complete by:  As directed    Call MD for:  severe uncontrolled pain   Complete by:  As directed    Call MD for:  temperature >100.4   Complete by:  As directed    Discharge instructions   Complete by:  As directed    1) All oral medications to be given via feeding tube. 2) Nothing by mouth (NPO). Continue tube feeding.   Discharge wound care:   Complete by:  As directed    aquacell dressing in place - Dr. Alvan Spears will remove at first office visit   Increase activity slowly   Complete by:  As directed    Weight bearing as tolerated   Complete by:  As directed    Laterality:  left   Extremity:  Lower       Medication List    STOP taking these medications   clotrimazole 1 % cream Commonly known as:  LOTRIMIN     TAKE these medications   acetaminophen 500 MG tablet Commonly known as:  TYLENOL Place 500 mg into feeding tube 3 (three) times daily. May give an additional 500mg  per g-tube three times daily as needed for mild/moderate pain   PROAIR HFA 108 (90 Base) MCG/ACT inhaler Generic drug:  albuterol Inhale 2 puffs into the lungs 2 (two) times daily. What changed:  Another medication with the same name was changed. Make sure you understand how and when to take each.   albuterol (2.5 MG/3ML) 0.083% nebulizer solution Commonly known as:  PROVENTIL Take 3 mLs (2.5 mg total) by nebulization every 6 (six) hours as needed for wheezing or shortness of breath. What changed:  reasons to take this   amantadine 50 MG/5ML solution Commonly known as:  SYMMETREL PLACE 82mL INTO FEEDING TUBE TWICE DAILY What changed:  See the new instructions.   amLODipine 10 MG tablet Commonly known as:  NORVASC Place 1 tablet (10 mg total) into feeding tube daily.   atorvastatin 20 MG tablet Commonly known as:  LIPITOR Place 20 mg into feeding tube at bedtime.   baclofen 10 MG tablet Commonly known as:  LIORESAL PLACE 1/2 TABLET (5 MG) INTO FEEDING TUBE FOUR TIMES  DAILY   bethanechol 1 mg/mL Susp Commonly known as:  URECHOLINE Place 5 mg into feeding tube daily.   bromocriptine 2.5 MG tablet Commonly known as:  PARLODEL Take 2.5-5 mg by mouth See admin instructions. 2.5mg  daily until 12/31 and then increase to 5mg  daily   CALCIUM CARBONATE-VITAMIN D3 PO Place 2 tablets into feeding tube daily.   carbidopa-levodopa 25-100 MG tablet Commonly known as:  SINEMET IR Place 2 tablets into feeding tube 3 (three) times daily.   chlorhexidine 0.12 % solution Commonly known as:  PERIDEX 31ml IN THE MOUTH OR THROAT TWICE DAILY What changed:  See the new instructions.   COQ10 PO Place 10 mLs into feeding tube daily. 0600   enoxaparin 40 MG/0.4ML injection Commonly known as:  LOVENOX Inject 0.4 mLs (40 mg total) into the skin daily.   feeding supplement (OSMOLITE 1.5 CAL) Liqd Give nightly continuous feed for 13 hours at 169ml/hour. Give two bolus feeds of 217ml in between sinemet doses. What changed:    how much to take  how to take this  when to take this  additional instructions   feeding supplement (PRO-STAT SUGAR FREE 64) Liqd Place  30 mLs into feeding tube 2 (two) times daily.   fluticasone 50 MCG/ACT nasal spray Commonly known as:  FLONASE Place 1 spray into both nostrils daily.   free water Soln Place 200 mLs into feeding tube every 8 (eight) hours. What changed:    when to take this  Another medication with the same name was removed. Continue taking this medication, and follow the directions you see here.   gabapentin 100 MG capsule Commonly known as:  NEURONTIN One capsule--open and administer via PEG twice a day. What changed:    how much to take  when to take this  additional instructions   guaiFENesin 100 MG/5ML Soln Commonly known as:  ROBITUSSIN Place 10 mLs (200 mg total) into feeding tube daily as needed for cough.   ibuprofen 100 MG/5ML suspension Commonly known as:  ADVIL,MOTRIN Place 400 mg into  feeding tube every 6 (six) hours as needed for mild pain or moderate pain.   ketoconazole 2 % cream Commonly known as:  NIZORAL Apply 1 application topically 2 (two) times daily.   loratadine 10 MG tablet Commonly known as:  CLARITIN Place 1 tablet (10 mg total) into feeding tube daily.   Melatonin 5 MG Caps Place 5 mg at bedtime into feeding tube.   multivitamin Liqd Place 15 mLs into feeding tube daily. What changed:  additional instructions   oxyCODONE 5 MG immediate release tablet Commonly known as:  Oxy IR/ROXICODONE Take 0.5-1 tablets (2.5-5 mg total) by mouth every 4 (four) hours as needed for moderate pain.   pantoprazole sodium 40 mg/20 mL Pack Commonly known as:  PROTONIX Place 40 mg into feeding tube daily.   polyethylene glycol packet Commonly known as:  MIRALAX / GLYCOLAX Place 17 g into feeding tube daily as needed for mild constipation.   potassium chloride 20 MEQ/15ML (10%) Soln Place 15 mLs (20 mEq total) into feeding tube daily.   primidone 50 MG tablet Commonly known as:  MYSOLINE Place 1 tablet (50 mg total) into feeding tube 4 (four) times daily.   saccharomyces boulardii 250 MG capsule Commonly known as:  FLORASTOR Place 250 mg into feeding tube 2 (two) times daily.   senna 8.6 MG Tabs tablet Commonly known as:  SENOKOT Place 1 tablet (8.6 mg total) into feeding tube 2 (two) times daily.   sodium chloride HYPERTONIC 3 % nebulizer solution Take by nebulization 2 (two) times daily as needed for other.   SYSTANE BALANCE 0.6 % Soln Generic drug:  Propylene Glycol Place 1 drop into both eyes daily.   valACYclovir 500 MG tablet Commonly known as:  VALTREX Place 500 mg daily into feeding tube.   vitamin C 500 MG tablet Commonly known as:  ASCORBIC ACID Place 500 mg daily into feeding tube.   Vitamin D 2000 units Caps Place 2,000 Units daily into feeding tube.      Follow-up Information    Judy Cancel, MD. Schedule an appointment as  soon as possible for a visit in 2 week(s).   Specialty:  Orthopedic Surgery Contact information: [address] [place] [address] [phone]        Judy Redwood, MD. Schedule an appointment as soon as possible for a visit in 1 week(s).   Specialty:  Internal Medicine Why:  To be seen with repeat labs (CBC & BMP). Contact information: [address] [place] [address] [phone]          Allergies  Allergen Reactions  . Cefepime Other (See  Comments)    Status epilepticus- this reaction is questionable per husband.States that it was determined that pt did not have seizure activity. Status epilepticus  . Tetracyclines & Related Photosensitivity and Other (See Comments)    Blisters (childhood allergy)  . Doxycycline Monohydrate Other (See Comments)    Blisters, photosensitivity- family state that the reaction is actually to tetracycline.  . Pollen Extract Other (See Comments)    Running nose (seasonal allergies)      Procedures/Studies: Dg Abd 1 View  Result Date: 01/27/2017 CLINICAL DATA:  Abdominal distension EXAM: ABDOMEN - 1 VIEW COMPARISON:  01/25/2017 FINDINGS: There is no bowel dilatation to suggest obstruction. There is no evidence of pneumoperitoneum, portal venous gas or pneumatosis. There are no pathologic calcifications along the expected course of the ureters. Comminuted left intertrochanteric fracture. 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 IMPRESSION: 1. No evidence bowel obstruction. 2. Comminuted left intertrochanteric fracture. 3. 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 Electronically Signed   By: Kathreen Devoid   On: 01/27/2017 12:34   Dg Chest Port 1 View  Result Date: 01/28/2017 CLINICAL DATA:  Respiratory failure. EXAM: PORTABLE CHEST 1  VIEW COMPARISON:  01/24/2017 FINDINGS: Cardiomediastinal silhouette is normal. Mediastinal contours appear intact. Calcific atherosclerotic disease and tortuosity of the aorta. There is no evidence of pleural effusion or pneumothorax. Interval development of right lower lung field airspace opacity. Osseous structures are without acute abnormality. Soft tissues are grossly normal. IMPRESSION: Tracheostomy tube in satisfactory position. Interval development of right lower lung field airspace opacity, which may represent atelectasis or airspace infiltrate. Electronically Signed   By: Fidela Salisbury M.D.   On: 01/28/2017 12:54   Dg Chest Port 1 View  Result Date: 01/24/2017 CLINICAL DATA:  Respiratory distress, tracheostomy patient. EXAM: PORTABLE CHEST 1 VIEW COMPARISON:  Chest x-ray of January 02, 2017 FINDINGS: The lungs are less well inflated today. There remain increased lung markings at the left lung base but the previously demonstrated confluent density has improved considerably. The heart is normal in size. The central pulmonary vascularity is mildly prominent but stable. There is no pleural effusion. The tracheostomy tube tip projects at the inferior margin of the clavicular heads. There is calcification in the wall of the aortic arch. The bony thorax exhibits no acute abnormality. A PEG tube is visible in the left upper abdomen. IMPRESSION: Improved appearance of the left lower lobe consistent with decreased atelectasis or pneumonia. No definite acute cardiopulmonary abnormality. Electronically Signed   By: David  Martinique M.D.   On: 01/24/2017 10:03   Dg Abd Portable 1v  Result Date: 01/25/2017 CLINICAL DATA:  Constipation.  PEG tube. EXAM: PORTABLE ABDOMEN - 1 VIEW COMPARISON:  None. FINDINGS: The bowel gas pattern is normal. Opaque tube is projected over the distal stomach. Mild bowel content is identified in the colon. IMPRESSION: No bowel obstruction. Electronically Signed   By: Abelardo Diesel  M.D.   On: 01/25/2017 20:13   Dg C-arm 1-60 Min  Result Date: 01/30/2017 CLINICAL DATA:  ORIF left hip fracture. EXAM: DG C-ARM 61-120 MIN; OPERATIVE LEFT HIP WITH PELVIS COMPARISON:  None. FINDINGS: Intraoperative views of both hips are submitted postoperatively for interpretation. Internal nail and screw fixation of the proximal left femur noted traversing a intertrochanteric fracture, in near-anatomic alignment and position. A single view of the right hip demonstrates an apparent subcapital fracture of  the right femur with superior displacement/shortening and rotation of the femoral head. IMPRESSION: 1. ORIF left femur fracture. 2. Apparent right subcapital right femur fracture with shortening and rotation of the femoral head. Electronically Signed   By: Margarette Canada M.D.   On: 01/30/2017 11:25   Dg Hip Port Unilat With Pelvis 1v Left  Result Date: 01/28/2017 CLINICAL DATA:  Hip fracture EXAM: DG HIP (WITH OR WITHOUT PELVIS) 1V PORT LEFT COMPARISON:  12/28/2015 FINDINGS: A single portable view of the left hip was obtained. This documents a comminuted intertrochanteric femoral neck fracture with varus angulation. IMPRESSION: Comminuted intertrochanteric femoral neck fracture. Electronically Signed   By: Misty Stanley M.D.   On: 01/28/2017 21:01   Dg Hip Operative Unilat W Or W/o Pelvis Left  Result Date: 01/30/2017 CLINICAL DATA:  ORIF left hip fracture. EXAM: DG C-ARM 61-120 MIN; OPERATIVE LEFT HIP WITH PELVIS COMPARISON:  None. FINDINGS: Intraoperative views of both hips are submitted postoperatively for interpretation. Internal nail and screw fixation of the proximal left femur noted traversing a intertrochanteric fracture, in near-anatomic alignment and position. A single view of the right hip demonstrates an apparent subcapital fracture of the right femur with superior displacement/shortening and rotation of the femoral head. IMPRESSION: 1. ORIF left femur fracture. 2. Apparent right subcapital right  femur fracture with shortening and rotation of the femoral head. Electronically Signed   By: Margarette Canada M.D.   On: 01/30/2017 11:25      Subjective: Patient interviewed and examined this morning with spouse and caregiver at bedside. Unable to understand patient's speech. However spouse indicates that she did speak a sentence this morning and reportedly patient is doing well and ready to go home. No acute issues reported. As per RN, patient was slightly anxious this morning which improved after pain medications. No acute issues reported by nursing.  Discharge Exam:  Vitals:   02/02/17 0310 02/02/17 0419 02/02/17 0815 02/02/17 1220  BP: 120/63 117/61 130/84 127/71  Spears: 84 87 90 90  Resp: (!) 30 (!) 35 (!) 26 (!) 25  Temp:  98.6 F (37 C)  99 F (37.2 C)  TempSrc:  Axillary  Axillary  SpO2: 94% 96% 100% 96%  Weight:      Height:        General: Pleasant middle-aged female, small built and frail, lying comfortably propped up in bed without distress. Cardiovascular: S1 and S2 heard, RRR. No JVD, murmurs or pedal edema. Telemetry: Sinus rhythm. 11 beat NSSVT noted. Respiratory: Slightly diminished breath sounds in the bases with occasional basal crackles but otherwise clear to auscultation without wheezing or rhonchi. No increased work of breathing. Tracheostomy +. Abdominal:  Non distended, non tender & soft. No organomegaly or masses appreciated. Normal bowel sounds heard. PEG tube intact. CNS: Alert. Unable to understand patient's speech or signs. However as per spouse, she did speak something earlier and he feels that she is doing well. Extremities: no edema, no cyanosis. Left hip postop dressing site clean and dry.    The results of significant diagnostics from this hospitalization (including imaging, microbiology, ancillary and laboratory) are listed below for reference.     Microbiology: Recent Results (from the past 240 hour(s))  Culture, respiratory (NON-Expectorated)      Status: None   Collection Time: 01/24/17 10:19 AM  Result Value Ref Range Status   Specimen Description TRACHEAL ASPIRATE  Final   Special Requests NONE  Final   Gram Stain   Final    MODERATE WBC  PRESENT, PREDOMINANTLY PMN ABUNDANT GRAM NEGATIVE RODS ABUNDANT GRAM POSITIVE COCCI ABUNDANT GRAM POSITIVE RODS    Culture Consistent with normal respiratory flora.  Final   Report Status 01/26/2017 FINAL  Final  Respiratory Panel by PCR     Status: None   Collection Time: 01/24/17 11:40 AM  Result Value Ref Range Status   Adenovirus NOT DETECTED NOT DETECTED Final   Coronavirus 229E NOT DETECTED NOT DETECTED Final   Coronavirus HKU1 NOT DETECTED NOT DETECTED Final   Coronavirus NL63 NOT DETECTED NOT DETECTED Final   Coronavirus OC43 NOT DETECTED NOT DETECTED Final   Metapneumovirus NOT DETECTED NOT DETECTED Final   Rhinovirus / Enterovirus NOT DETECTED NOT DETECTED Final   Influenza A NOT DETECTED NOT DETECTED Final   Influenza B NOT DETECTED NOT DETECTED Final   Parainfluenza Virus 1 NOT DETECTED NOT DETECTED Final   Parainfluenza Virus 2 NOT DETECTED NOT DETECTED Final   Parainfluenza Virus 3 NOT DETECTED NOT DETECTED Final   Parainfluenza Virus 4 NOT DETECTED NOT DETECTED Final   Respiratory Syncytial Virus NOT DETECTED NOT DETECTED Final   Bordetella pertussis NOT DETECTED NOT DETECTED Final   Chlamydophila pneumoniae NOT DETECTED NOT DETECTED Final   Mycoplasma pneumoniae NOT DETECTED NOT DETECTED Final  Culture, blood (Routine x 2)     Status: Abnormal   Collection Time: 01/24/17 11:45 AM  Result Value Ref Range Status   Specimen Description BLOOD RIGHT FOREARM  Final   Special Requests   Final    BOTTLES DRAWN AEROBIC AND ANAEROBIC Blood Culture adequate volume   Culture  Setup Time   Final    GRAM POSITIVE COCCI IN CLUSTERS AEROBIC BOTTLE ONLY CRITICAL RESULT CALLED TO, READ BACK BY AND VERIFIED WITH: PHARMD B MANCHERIL [id number] MLM    Culture (A)  Final     STAPHYLOCOCCUS SPECIES (COAGULASE NEGATIVE) THE SIGNIFICANCE OF ISOLATING THIS ORGANISM FROM A SINGLE SET OF BLOOD CULTURES WHEN MULTIPLE SETS ARE DRAWN IS UNCERTAIN. PLEASE NOTIFY THE MICROBIOLOGY DEPARTMENT WITHIN ONE WEEK IF SPECIATION AND SENSITIVITIES ARE REQUIRED.    Report Status 01/26/2017 FINAL  Final  Blood Culture ID Panel (Reflexed)     Status: Abnormal   Collection Time: 01/24/17 11:45 AM  Result Value Ref Range Status   Enterococcus species NOT DETECTED NOT DETECTED Final   Listeria monocytogenes NOT DETECTED NOT DETECTED Final   Staphylococcus species DETECTED (A) NOT DETECTED Final    Comment: Methicillin (oxacillin) resistant coagulase negative staphylococcus. Possible blood culture contaminant (unless isolated from more than one blood culture draw or clinical case suggests pathogenicity). No antibiotic treatment is indicated for blood  culture contaminants. CRITICAL RESULT CALLED TO, READ BACK BY AND VERIFIED WITH: PHARMD B MANCHERIL [id number] MLM    Staphylococcus aureus NOT DETECTED NOT DETECTED Final   Methicillin resistance DETECTED (A) NOT DETECTED Final    Comment: CRITICAL RESULT CALLED TO, READ BACK BY AND VERIFIED WITH: PHARMD B MANCHERIL [id number] MLM    Streptococcus species NOT DETECTED NOT DETECTED Final   Streptococcus agalactiae NOT DETECTED NOT DETECTED Final   Streptococcus pneumoniae NOT DETECTED NOT DETECTED Final   Streptococcus pyogenes NOT DETECTED NOT DETECTED Final   Acinetobacter baumannii NOT DETECTED NOT DETECTED Final   Enterobacteriaceae species NOT DETECTED NOT DETECTED Final   Enterobacter cloacae complex NOT DETECTED NOT DETECTED Final   Escherichia coli NOT DETECTED NOT DETECTED Final   Klebsiella oxytoca NOT DETECTED NOT DETECTED Final   Klebsiella pneumoniae NOT DETECTED NOT DETECTED Final  Proteus species NOT DETECTED NOT DETECTED Final   Serratia marcescens NOT DETECTED NOT DETECTED Final   Haemophilus influenzae NOT DETECTED  NOT DETECTED Final   Neisseria meningitidis NOT DETECTED NOT DETECTED Final   Pseudomonas aeruginosa NOT DETECTED NOT DETECTED Final   Candida albicans NOT DETECTED NOT DETECTED Final   Candida glabrata NOT DETECTED NOT DETECTED Final   Candida krusei NOT DETECTED NOT DETECTED Final   Candida parapsilosis NOT DETECTED NOT DETECTED Final   Candida tropicalis NOT DETECTED NOT DETECTED Final  Culture, blood (Routine x 2)     Status: None   Collection Time: 01/24/17 11:53 AM  Result Value Ref Range Status   Specimen Description BLOOD LEFT HAND  Final   Special Requests   Final    BOTTLES DRAWN AEROBIC AND ANAEROBIC Blood Culture adequate volume   Culture NO GROWTH 5 DAYS  Final   Report Status 01/29/2017 FINAL  Final  Urine culture     Status: Abnormal   Collection Time: 01/24/17  1:01 PM  Result Value Ref Range Status   Specimen Description URINE, CLEAN CATCH  Final   Special Requests Normal  Final   Culture MULTIPLE SPECIES PRESENT, SUGGEST RECOLLECTION (A)  Final   Report Status 01/25/2017 FINAL  Final  MRSA PCR Screening     Status: Abnormal   Collection Time: 01/26/17 12:47 PM  Result Value Ref Range Status   MRSA by PCR POSITIVE (A) NEGATIVE Final    Comment:        The GeneXpert MRSA Assay (FDA approved for NASAL specimens only), is one component of a comprehensive MRSA colonization surveillance program. It is not intended to diagnose MRSA infection nor to guide or monitor treatment for MRSA infections. RESULT CALLED TO, READ BACK BY AND VERIFIED WITH: H. Deaton RN 14:40 01/26/17 ([email or web address])   Surgical pcr screen     Status: Abnormal   Collection Time: 01/29/17  8:37 PM  Result Value Ref Range Status   MRSA, PCR POSITIVE (A) NEGATIVE Final    Comment: RESULT CALLED TO, READ BACK BY AND VERIFIED WITH: DECAREAUX,T RN 0034 01/30/17 MITCHELL,L    Staphylococcus aureus POSITIVE (A) NEGATIVE Final    Comment: (NOTE) The Xpert SA Assay (FDA approved for NASAL specimens in  patients 64 years of age and older), is one component of a comprehensive surveillance program. It is not intended to diagnose infection nor to guide or monitor treatment.      Labs: CBC: Recent Labs  Lab 01/28/17 1340  01/30/17 0733 01/30/17 1338 01/31/17 0340 02/01/17 0556 02/02/17 0217  WBC 12.5*   < > 12.4* 13.2* 9.9 12.0* 9.1  NEUTROABS 9.3*  --   --   --   --   --   --   HGB 10.8*   < > 12.2 11.3* 9.7* 10.6* 9.4*  HCT 33.1*   < > 36.3 35.6* 31.3* 32.5* 30.7*  MCV 89.9   < > 89.9 90.4 89.7 89.5 91.1  PLT 321   < > 474* 428* 460* 439* 472*   < > = values in this interval not displayed.   Basic Metabolic Panel: Recent Labs  Lab 01/28/17 1340 01/29/17 0237 01/30/17 0733 01/30/17 1338 01/31/17 0340 02/01/17 0556  NA 129* 132* 126*  --  131* 132*  K 4.4 4.1 4.0  --  4.2 4.4  CL 93* 95* 87*  --  93* 92*  CO2 27 28 25   --  28 24  GLUCOSE 84 140* 95  --  85 112*  BUN 12 13 11   --  18 16  CREATININE <0.30* <0.30* <0.30* 0.30* 0.31* 0.31*  CALCIUM 8.7* 8.5* 9.2  --  8.7* 9.0  MG  --  2.0  --   --   --   --    Liver Function Tests: Recent Labs  Lab 01/27/17 0231 01/29/17 0237  AST 45* 43*  ALT 64* 54  ALKPHOS 114 127*  BILITOT 0.8 0.8  PROT 5.8* 5.8*  ALBUMIN 2.6* 2.6*   CBG: Recent Labs  Lab 01/30/17 1059  GLUCAP 125*   Urinalysis    Component Value Date/Time   COLORURINE YELLOW 01/24/2017 1257   APPEARANCEUR HAZY (A) 01/24/2017 1257   LABSPEC 1.017 01/24/2017 1257   PHURINE 6.0 01/24/2017 1257   GLUCOSEU 50 (A) 01/24/2017 1257   HGBUR NEGATIVE 01/24/2017 [place] 01/24/2017 1257   KETONESUR NEGATIVE 01/24/2017 1257   PROTEINUR 30 (A) 01/24/2017 1257   NITRITE NEGATIVE 01/24/2017 1257   LEUKOCYTESUR NEGATIVE 01/24/2017 1257    Discussed in detail with patient's spouse and caregiver at bedside. Updated care and answered questions.  Time coordinating discharge: Over 30 minutes  SIGNED:  Vernell Leep, MD, FACP,  [facility]. [facility] Pager [phone]  If 7PM-7AM, please contact night-coverage [email or web address] Password [email or web address] 02/02/2017, 12:40 PM

## 2017-02-02 NOTE — Discharge Instructions (Signed)

## 2017-02-02 NOTE — Progress Notes (Signed)
Patient's trach changed per protocol.  Patient with long term trach at home, 4CFS with disposable inner cannula.  4CFS removed, and 4CFS with disposable inner cannula placed with no difficulty.  Easy cap with positive color change.  Trach secured with velcro trach ties, and drain sponge placed.  No complications noted. Will continue to monitor.

## 2017-02-02 NOTE — Progress Notes (Signed)
Discharged home accompanied by husband, discharge instructions and prescription given to husband, belongings taken home.

## 2017-02-02 NOTE — Progress Notes (Signed)
CPT held due to patient being in pain.  RN to give pain meds.

## 2017-02-02 NOTE — Care Management (Signed)
/  Viona Gilmore JERRY @ [facility] [phone]    1. LOVENOX 40 MG DAILY 14 DAYS/SYRINGES  COVER- YES  CO-PAY- $ 64.00  TIER- 3 DRUG  PRIOR APPROVAL- NO    2. ENOXAPARIN 40 MG DAILY 14 DAYS / SYRINGES  COVER- YES  CO-PAY- $ 64.00  TIER- 3 DRUG  PRIOR APPROVAL- NO   NO DEDUCTIBLE   PREFERRED PHARMACY : [facility]   Previous Messages      CM verified with [place] that pharmacy can fill prescription today

## 2017-02-02 NOTE — Care Management Note (Addendum)
Case Management Note  Patient Details  Name: Judy Spears MRN: [id number] Date of Birth: 03-10-1950  Subjective/Objective:                    Action/Plan:  PTA from home (total care) - recently discharged from [facility] to home with 24 hour care.  Pt travels via private vehicle driven by husband.  Pt has chronic trach - PTA on RA.  Discussed in LOS 02/02/17 - Pt placed in [place] with [place] - husband in agreement for [facility] and will discontinue outpt PT,OT, Speech Therapy.  Per attending pt will need humidified air (not actual oxygen) and Lovenox at discharge. Husband chose [facility] for DME - agency contacted and referral pending review as pt will not meet generic qualifications for home oxygen. Benefit check submitted for Lovenox 36m Daily for 14 days.      Expected Discharge Date:                  Expected Discharge Plan:  [place] In-House Referral:  Clinical Social Work  Discharge planning Services  CM Consult  Post Acute Care Choice:    Choice offered to:  Spouse  DME Arranged:  (humidifed air, hoyer lift) DME Agency:  [place]  [place], OT, Speech Therapy   [place]  [place] ([place] 02[place]01[place]17)  Status of Service:  In process, will continue to follow  If discussed at Long Length of Stay Meetings, dates discussed:    Additional Comments: 02/02/2017  CM confirmed with attending - AVS is incorrect with listing outpt therapy - pt will go home with home health.  Hus[facility]  Agency liaison met with husband and [facility].  Husband informed CM that he already has extensive resource base at home including RN and quick access to PCP.  CM informed by husband that he now has [facility]- he voiced that he would like to know who the therapist will be prior to agreeing to the  program  (he stated he already has a great pool of resources established in the treatment team for his wife).  CM reiterated to husband that [facility].   CM also explained that the actual therapist may not be known at this time and that agency has up to 48 hours to contact them regarding HFairbankservices.  CM requested ACarthageliaison to meet at pts bedside to discuss concerns with husband- CM will follow back up to gain final decision regarding [facility] [place].  Pt is in agreement with recommended hoyer lift - [facility] contacted and referral accepted.  [facility] - humidification will be set up in pts home today.  Per attending pt will not require home oxygen nor oxygen during transport home.    CMaryclare Labrador RN 02/02/2017, 12:05 PM

## 2017-02-03 ENCOUNTER — Encounter: Payer: Medicare Other | Admitting: Occupational Therapy

## 2017-02-03 ENCOUNTER — Ambulatory Visit: Payer: Medicare Other | Admitting: Speech Pathology

## 2017-02-07 ENCOUNTER — Encounter: Payer: Medicare Other | Admitting: Occupational Therapy

## 2017-02-07 ENCOUNTER — Ambulatory Visit: Payer: Medicare Other | Admitting: Physical Therapy

## 2017-02-08 ENCOUNTER — Encounter: Payer: Self-pay | Admitting: Occupational Therapy

## 2017-02-08 ENCOUNTER — Encounter: Payer: Medicare Other | Admitting: Occupational Therapy

## 2017-02-08 DIAGNOSIS — M6281 Muscle weakness (generalized): Secondary | ICD-10-CM

## 2017-02-08 NOTE — Therapy (Signed)
[facility] [address] [place], [place], [address] Phone: [phone]   Fax:  [phone]  Occupational Therapy Treatment  Patient Details  Name: Judy Spears MRN: [id number] Date of Birth: Nov 18, 1950 Referring Provider: Alger Simons, MD   Encounter Date: 02/08/2017    Past Medical History:  Diagnosis Date  . Arthritis    lt great toe  . Complication of anesthesia    pt has had headaches post op-did advise to hydra well preop  . Hair loss    right-sided  . History of exercise stress test    a. ETT (12/15) with 12:00 exercise, no ST changes, occasional PACs.   . Hyperlipidemia   . Insomnia   . Migraine headache   . Movement disorder    resltess in left legs  . Postmenopausal   . 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 Premature atrial contractions    a. Holter (12/15) with 8% PACs, no atrial runs or atrial fibrillation.     Past Surgical History:  Procedure Laterality Date  . BREAST BIOPSY  01/01/2011   Procedure: BREAST BIOPSY WITH NEEDLE LOCALIZATION;  Surgeon: Rolm Bookbinder, MD;  Location: [facility];  Service: General;  Laterality: Left;  left breast wire localization biopsy  . BREAST BIOPSY Right 07/27/2012  . BREAST BIOPSY Right 07/07/2016  . BREAST BIOPSY Left 06/30/2016  . cataracts right eye    . CESAREAN SECTION  1986  . HERNIA REPAIR  2000   RIH  . INTRAMEDULLARY (IM) NAIL INTERTROCHANTERIC Left 01/30/2017   Procedure: ORIF AFFIXUS NAIL;  Surgeon: Paralee Cancel, MD;  Location: [facility];  Service: Orthopedics;  Laterality: Left;  . IR REPLC GASTRO/COLONIC TUBE PERCUT W/FLUORO  09/29/2016  . opirectinal membrane peel    . PEG PLACEMENT    . RHINOPLASTY  1983  . TRACHEOSTOMY    . TRACHEOSTOMY TUBE PLACEMENT N/A 12/15/2016   Procedure: TRACHEOSTOMY CHANGE;  Surgeon: [name] Montane, MD;  Location: [facility];  Service: ENT;  Laterality: N/A;    There were no vitals filed for this  visit.                          OT Short Term Goals - 02/08/17 1327      OT SHORT TERM GOAL #1   Title  Pt, caregiver and husband will be mod I with HEP - 12/22/2016    Status  On-going      OT SHORT TERM GOAL #2   Title  Pt will consistently require max assist x1 from caregiver or husband for basic transfers (currently total A x 2 for untrained health professional)    Status  On-going      OT SHORT TERM GOAL #3   Title  Pt will sit on level surfaces in preparation for participation in ADL tasks with intermittent min a and mod cues after preparation.    Status  Achieved      OT SHORT TERM GOAL #4   Title  Pt will require no more than total assist x1 from family/caregiver to use steady assist for basic transfers (not toilet or shower transfers).      Status  On-going      OT SHORT TERM GOAL #5   Title  Pt will demonstrate ability to follow simple, functional commands in context at least 50% of the time.     Status  On-going        OT Long  Term Goals - 02/08/17 1327      OT LONG TERM GOAL #1   Title  Pt , husband and caregiver will be mod I with upgraded HEP prn - 12/27/2016    Status  On-going      OT LONG TERM GOAL #2   Title  Pt will will demonstrate ability to sit on even surface with supervision while engaging in simple activity    Status  On-going      OT LONG TERM GOAL #3   Title  Pt will be mod a for squat pivot basic transfers    Status  On-going      OT LONG TERM GOAL #4   Title  Pt will be mod a x1 to use steady for toilet transfers    Status  On-going      OT LONG TERM GOAL #5   Title  Pt will be max a x1 for clothing mgmt using steady for toilet transfers    Status  On-going      OT LONG TERM GOAL #6   Title  Pt will demonstrate ability to don shirt over both arms with set up.     Status  On-going      OT LONG TERM GOAL #7   Title  Pt will demonstrate ability to sit with supevision in wheelchair shower seat during shower.      Status  On-going            Plan - 02/08/17 1327    Clinical Impression Statement  Pt admitted to hospital and with extensive medical issues. Pt has not returned to outpatient clinic will d/c from OT at this time.        Patient will benefit from skilled therapeutic intervention in order to improve the following deficits and impairments:     Visit Diagnosis: Muscle weakness (generalized)    Problem List Patient Active Problem List   Diagnosis Date Noted  . Abdominal distension   . Chronic mucus hypersecretion, respiratory 12/31/2016  . Severe protein-calorie malnutrition [name]: less than 60% of standard weight) ([place]) 12/31/2016  . Acute urinary retention 12/31/2016  . Constipation 12/31/2016  . Shortness of breath   . Respiratory distress   . Pneumonia 12/05/2016  . Acute respiratory failure ([place]) 12/05/2016  . Pseudomonas urinary tract infection 10/27/2016  . Severe muscle deconditioning 09/30/2016  . Pressure injury of skin 09/28/2016  . Respiratory failure ([place])   . Tracheobronchitis   . Urinary retention   . PEG (percutaneous endoscopic gastrostomy) status ([place])   . Benign essential HTN   . Hyponatremia   . Hypokalemia   . Reactive hypertension   . Respiratory insufficiency 09/24/2016  . Malignant neoplasm of upper-inner quadrant of left breast in female, estrogen receptor positive ([place]) 07/14/2016  . Heterotopic ossification of bone 03/10/2016  . Acute on chronic respiratory failure with hypoxia ([place])   . Acute hypoxemic respiratory failure ([place])   . Acute blood loss anemia   . Atelectasis 02/04/2016  . Debility 02/03/2016  . Pneumonia of both lower lobes due to Pseudomonas species ([place])   . Pneumonia of both lower lobes due to methicillin susceptible Staphylococcus aureus (MSSA) ([place])   . Acute respiratory failure with hypoxia ([place])   . Acute encephalopathy   . Seizures ([place])   . Sepsis ([place])   . Hypoxia   . Pain   . HCAP (healthcare-associated  pneumonia) 12/28/2015  . Chronic respiratory failure ([place]) 12/28/2015  . Acute tracheobronchitis 12/24/2015  .  Tracheostomy tube present ([place])   . Tracheal stenosis   . Chronic respiratory failure with hypoxia ([place])   . Spastic tetraplegia ([place]) 10/20/2015  . Tracheostomy status ([place]) 09/26/2015  . Allergic rhinitis 09/26/2015  . 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 Movement disorder 09/22/2015  . Encephalitis 09/22/2015  . Chest pain 02/07/2014  . Premature atrial contractions 01/13/2014  . Abnormality of gait 12/04/2013  . Hyperlipidemia 12/21/2011  . Cardiovascular risk factor 12/21/2011  . Bunion 01/31/2008  . Metatarsalgia of both feet 09/20/2007  . FLAT FOOT 09/20/2007  . HALLUX RIGIDUS, ACQUIRED 09/20/2007   OCCUPATIONAL THERAPY DISCHARGE SUMMARY  Visits from Start of Care: 6 (in this episode of care)  Current functional level related to goals / functional outcomes: See above   Remaining deficits: See initial eval    Education / Equipment: HEP, splints Plan: Patient agrees to discharge.  Patient goals were not met. Patient is being discharged due to a change in medical status.  ?????      Quay Burow, OTR/L 02/08/2017, 1:28 PM  [facility] [address] [place], [place], [address] Phone: [phone]   Fax:  [phone]  Name: Judy Spears MRN: [id number] Date of Birth: [birth year]

## 2017-02-09 ENCOUNTER — Encounter: Payer: Self-pay | Admitting: Infectious Diseases

## 2017-02-09 ENCOUNTER — Ambulatory Visit (INDEPENDENT_AMBULATORY_CARE_PROVIDER_SITE_OTHER): Payer: Medicare Other | Admitting: Infectious Diseases

## 2017-02-09 ENCOUNTER — Other Ambulatory Visit: Payer: Self-pay | Admitting: Physical Medicine & Rehabilitation

## 2017-02-09 DIAGNOSIS — G9340 Encephalopathy, unspecified: Secondary | ICD-10-CM | POA: Diagnosis not present

## 2017-02-09 DIAGNOSIS — R339 Retention of urine, unspecified: Secondary | ICD-10-CM | POA: Diagnosis not present

## 2017-02-09 DIAGNOSIS — J9611 Chronic respiratory failure with hypoxia: Secondary | ICD-10-CM

## 2017-02-09 DIAGNOSIS — R7881 Bacteremia: Secondary | ICD-10-CM | POA: Diagnosis not present

## 2017-02-09 DIAGNOSIS — A852 Arthropod-borne viral encephalitis, unspecified: Secondary | ICD-10-CM

## 2017-02-09 DIAGNOSIS — B957 Other staphylococcus as the cause of diseases classified elsewhere: Secondary | ICD-10-CM

## 2017-02-09 NOTE — Assessment & Plan Note (Signed)
Family is corresponding with [facility] for [place] vaccine.

## 2017-02-09 NOTE — Assessment & Plan Note (Signed)
Has shaker vest home . Has nebulizer at home.  Has deep suction at home. Have not needed this for over a week.   Could consider home IV nebs anbx if she has recurrence and they would like to stay at home.  Will cc: Dr Sherrin Daisy.

## 2017-02-09 NOTE — Progress Notes (Signed)
   Subjective:    Patient ID: Judy Spears, female    DOB: 1950/06/05, 67 y.o.   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 She has since had spastic tetraplegia with PEG, trach.  She has since been followed at [facility] institute in [place] and more recently at [facility] rehab center. She was in hospital 01-24-17 to 02-02-17 with hypoxia, mental status change, and recurrent mucous plugging (prev MRSA and pseudomonas), she was started on vanco/cefepime.  By 1-4 vanco was stopped, ceftaz stopped 1-6 after 7 days. Her trach Cx grew resp flora.   She had 1/2 BCx CNS.  UCx was polymicrobial.  Her hx is also notable for a R femoral neck fracture. She had abd film in hospital and was found to have L femur fracture. She underwent repair of this while in hospital (1-6).  She was felt to be at baseline and on 1-9 was d/c to home.  Has been doing very well since. Wound is healing well.  Mobility has increased, improved communication.  Possible repair of R THR in future. Bone scan in AM. Was only able to take anestrizole for 6 weeks due to hip fracture. Diona Foley is 11% reabsorped.  Has eval with neuro/onc at [facility] to have LP and MRI due to change mentation since July. Possible autoimmune reaction leading to this due to multiple events over the summer.   Breathing has been good at home- thin secretions, using humidified air at night. Afebrile. Sats 98%.  The past medical history, family history and social history were reviewed/updated in EPIC  Review of Systems Has hospital bed at home.  Caregivers not aware of alb/pre-alb.  No bed sores. Has patches that they can use at first sign of irritation. Had prev bed sore at [facility].  Is able to let caregiver/family know when she needs to toilet. She is on scheduled voicing.  Please see HPI. All other systems reviewed and negative.     Objective:    Physical Exam  Constitutional: She appears well-developed and well-nourished.  HENT:  Mouth/Throat: No oropharyngeal exudate.  Eyes: EOM are normal. Pupils are equal, round, and reactive to light.  Neck: Neck supple.    Cardiovascular: Normal rate and normal heart sounds.  Pulmonary/Chest: Effort normal and breath sounds normal. Tachypnea noted.  Abdominal: Soft. Bowel sounds are normal. There is no tenderness. There is no rebound.  Musculoskeletal:       Feet:  Lymphadenopathy:    She has no cervical adenopathy.          Assessment & Plan:

## 2017-02-09 NOTE — Assessment & Plan Note (Signed)
Appears to be near baseline.

## 2017-02-09 NOTE — Assessment & Plan Note (Addendum)
Will recheck her Bcx to assure that this is not present.  I have asked them to let me of any issues going forward and will be happy to see them back as needed.

## 2017-02-09 NOTE — Assessment & Plan Note (Signed)
Would hold on any anbx for her colonization at this point.

## 2017-02-10 ENCOUNTER — Encounter: Payer: Medicare Other | Admitting: Speech Pathology

## 2017-02-10 ENCOUNTER — Telehealth: Payer: Self-pay

## 2017-02-10 NOTE — Telephone Encounter (Signed)
[facility] pharmacist left a message asking for clarification for patients prescription for primidone. Please call back.

## 2017-02-10 NOTE — Telephone Encounter (Signed)
I contacted the pharmacy.  They were confused because they had received a Rx refusal this morning from [place] for patient's Rx request for primidone. Just yesterday they had received an authorization for a different electronic request for medication.  I explained to the pharmacist that yesterdays refill was an error.  I explained that the patient's husband  had essentially fired Dr. [name] and that the patient was no longer under the prescribers care.  I told the pharmacist I had texted Dr. [name] after seeing the electronic request this morning and he respond by saying that we should not refill patient's scripts. The pharmacist verbalized understanding. Our clinic manager, Zorita Pang, was notified. Our nurse, Wenda Overland, posted an official discharge notice in patient's chart

## 2017-02-14 ENCOUNTER — Ambulatory Visit: Payer: Medicare Other | Admitting: Occupational Therapy

## 2017-02-15 LAB — CULTURE, BLOOD (SINGLE)
MICRO NUMBER: 90066544
MICRO NUMBER:: 90066503
RESULT: NO GROWTH
Result:: NO GROWTH
SPECIMEN QUALITY:: ADEQUATE
SPECIMEN QUALITY:: ADEQUATE

## 2017-02-16 ENCOUNTER — Encounter: Payer: Medicare Other | Admitting: Occupational Therapy

## 2017-02-17 ENCOUNTER — Ambulatory Visit: Payer: Medicare Other | Admitting: Speech Pathology

## 2017-02-17 ENCOUNTER — Ambulatory Visit: Payer: Medicare Other | Admitting: Physical Therapy

## 2017-02-21 ENCOUNTER — Ambulatory Visit: Payer: Medicare Other | Admitting: Physical Therapy

## 2017-02-21 ENCOUNTER — Encounter: Payer: Medicare Other | Admitting: Speech Pathology

## 2017-02-23 ENCOUNTER — Encounter: Payer: Medicare Other | Admitting: Occupational Therapy

## 2017-02-24 ENCOUNTER — Ambulatory Visit: Payer: Medicare Other | Admitting: Physical Therapy

## 2017-02-24 ENCOUNTER — Encounter: Payer: Medicare Other | Admitting: Speech Pathology

## 2017-02-28 ENCOUNTER — Encounter: Payer: Medicare Other | Admitting: Occupational Therapy

## 2017-02-28 ENCOUNTER — Encounter: Payer: Medicare Other | Admitting: Speech Pathology

## 2017-02-28 ENCOUNTER — Ambulatory Visit: Payer: Medicare Other | Admitting: Physical Therapy

## 2017-03-03 ENCOUNTER — Ambulatory Visit: Payer: Medicare Other | Admitting: Physical Therapy

## 2017-03-03 ENCOUNTER — Encounter: Payer: Medicare Other | Admitting: Occupational Therapy

## 2017-03-03 ENCOUNTER — Encounter: Payer: Medicare Other | Admitting: Speech Pathology

## 2017-03-07 ENCOUNTER — Encounter: Payer: Medicare Other | Admitting: Speech Pathology

## 2017-03-07 ENCOUNTER — Ambulatory Visit: Payer: Medicare Other | Admitting: Physical Therapy

## 2017-03-07 ENCOUNTER — Encounter: Payer: Medicare Other | Admitting: Occupational Therapy

## 2017-03-10 ENCOUNTER — Encounter: Payer: Medicare Other | Admitting: Speech Pathology

## 2017-03-10 ENCOUNTER — Ambulatory Visit: Payer: Medicare Other | Admitting: Physical Therapy

## 2017-03-14 ENCOUNTER — Encounter: Payer: Medicare Other | Admitting: Speech Pathology

## 2017-03-15 ENCOUNTER — Encounter: Payer: Medicare Other | Admitting: Occupational Therapy

## 2017-03-15 ENCOUNTER — Ambulatory Visit: Payer: Medicare Other | Admitting: Physical Therapy

## 2017-03-15 ENCOUNTER — Telehealth: Payer: Self-pay | Admitting: Pulmonary Disease

## 2017-03-15 NOTE — Telephone Encounter (Signed)
Called and spoke with patients husband, he is aware of BQ response. Nothing further needed.

## 2017-03-15 NOTE — Telephone Encounter (Signed)
Yes

## 2017-03-15 NOTE — Telephone Encounter (Signed)
Spoke with Mr. Aube, he states patient is doing fine he is just wanting to know if they can switch her to the cap plug. BQ please advise on this, thanks.

## 2017-03-17 ENCOUNTER — Encounter: Payer: Medicare Other | Admitting: Speech Pathology

## 2017-03-17 ENCOUNTER — Ambulatory Visit: Payer: Medicare Other | Admitting: Physical Therapy

## 2017-03-17 ENCOUNTER — Encounter: Payer: Medicare Other | Admitting: Occupational Therapy

## 2017-05-10 ENCOUNTER — Ambulatory Visit: Payer: Medicare Other

## 2017-05-10 ENCOUNTER — Encounter: Payer: Self-pay | Admitting: Physical Therapy

## 2017-05-10 ENCOUNTER — Encounter: Payer: Self-pay | Admitting: Occupational Therapy

## 2017-05-10 ENCOUNTER — Ambulatory Visit: Payer: Medicare Other | Admitting: Physical Therapy

## 2017-05-10 ENCOUNTER — Other Ambulatory Visit: Payer: Self-pay

## 2017-05-10 ENCOUNTER — Ambulatory Visit: Payer: Medicare Other | Attending: Physician Assistant | Admitting: Occupational Therapy

## 2017-05-10 DIAGNOSIS — R278 Other lack of coordination: Secondary | ICD-10-CM

## 2017-05-10 DIAGNOSIS — R258 Other abnormal involuntary movements: Secondary | ICD-10-CM

## 2017-05-10 DIAGNOSIS — M25611 Stiffness of right shoulder, not elsewhere classified: Secondary | ICD-10-CM | POA: Diagnosis present

## 2017-05-10 DIAGNOSIS — R293 Abnormal posture: Secondary | ICD-10-CM

## 2017-05-10 DIAGNOSIS — R4701 Aphasia: Secondary | ICD-10-CM | POA: Diagnosis present

## 2017-05-10 DIAGNOSIS — M6281 Muscle weakness (generalized): Secondary | ICD-10-CM | POA: Diagnosis not present

## 2017-05-10 DIAGNOSIS — R471 Dysarthria and anarthria: Secondary | ICD-10-CM

## 2017-05-10 DIAGNOSIS — M25622 Stiffness of left elbow, not elsewhere classified: Secondary | ICD-10-CM | POA: Insufficient documentation

## 2017-05-10 DIAGNOSIS — R2681 Unsteadiness on feet: Secondary | ICD-10-CM | POA: Insufficient documentation

## 2017-05-10 DIAGNOSIS — R29818 Other symptoms and signs involving the nervous system: Secondary | ICD-10-CM | POA: Diagnosis present

## 2017-05-10 DIAGNOSIS — R41841 Cognitive communication deficit: Secondary | ICD-10-CM | POA: Insufficient documentation

## 2017-05-10 DIAGNOSIS — R2689 Other abnormalities of gait and mobility: Secondary | ICD-10-CM | POA: Diagnosis present

## 2017-05-10 DIAGNOSIS — R1312 Dysphagia, oropharyngeal phase: Secondary | ICD-10-CM | POA: Diagnosis present

## 2017-05-10 DIAGNOSIS — R41844 Frontal lobe and executive function deficit: Secondary | ICD-10-CM | POA: Insufficient documentation

## 2017-05-10 DIAGNOSIS — M25612 Stiffness of left shoulder, not elsewhere classified: Secondary | ICD-10-CM | POA: Diagnosis present

## 2017-05-10 DIAGNOSIS — G8250 Quadriplegia, unspecified: Secondary | ICD-10-CM | POA: Insufficient documentation

## 2017-05-10 NOTE — Therapy (Signed)
[facility] [address] [place], [place], [address] Phone: [phone]   Fax:  [phone]  Occupational Therapy Evaluation  Patient Details  Name: Judy Spears MRN: [id number] Date of Birth: 08-22-1950 Referring Provider: Dr Levada Schilling   Encounter Date: 05/10/2017  OT End of Session - 05/10/17 1613    Visit Number  1    Number of Visits  16    Date for OT Re-Evaluation  07/05/17    Authorization Type  medicare     Authorization Time Period  90 days    OT Start Time  1102    OT Stop Time  1144    OT Time Calculation (min)  42 min    Activity Tolerance  Patient limited by lethargy       Past Medical History:  Diagnosis Date  . Arthritis    lt great toe  . Complication of anesthesia    pt has had headaches post op-did advise to hydra well preop  . Hair loss    right-sided  . History of exercise stress test    a. ETT (12/15) with 12:00 exercise, no ST changes, occasional PACs.   . Hyperlipidemia   . Insomnia   . Migraine headache   . Movement disorder    resltess in left legs  . Postmenopausal   . 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 Premature atrial contractions    a. Holter (12/15) with 8% PACs, no atrial runs or atrial fibrillation.     Past Surgical History:  Procedure Laterality Date  . BREAST BIOPSY  01/01/2011   Procedure: BREAST BIOPSY WITH NEEDLE LOCALIZATION;  Surgeon: Rolm Bookbinder, MD;  Location: [facility];  Service: General;  Laterality: Left;  left breast wire localization biopsy  . BREAST BIOPSY Right 07/27/2012  . BREAST BIOPSY Right 07/07/2016  . BREAST BIOPSY Left 06/30/2016  . cataracts right eye    . CESAREAN SECTION  1986  . HERNIA REPAIR  2000   RIH  . INTRAMEDULLARY (IM) NAIL INTERTROCHANTERIC Left 01/30/2017   Procedure: ORIF AFFIXUS NAIL;  Surgeon: Paralee Cancel, MD;  Location: [facility];  Service: Orthopedics;  Laterality: Left;  . IR REPLC GASTRO/COLONIC  TUBE PERCUT W/FLUORO  09/29/2016  . opirectinal membrane peel    . PEG PLACEMENT    . RHINOPLASTY  1983  . TRACHEOSTOMY    . TRACHEOSTOMY TUBE PLACEMENT N/A 12/15/2016   Procedure: TRACHEOSTOMY CHANGE;  Surgeon: [name] Montane, MD;  Location: [facility];  Service: ENT;  Laterality: N/A;    There were no vitals filed for this visit.  Subjective Assessment - 05/10/17 1111    Patient is accompained by:  Family member husband [name] and cargiver [name]    Pertinent History  see epic snapshot - ABI/encephalitis/hypoxia from virus; recent hospitalization due to MRSA PNA     Patient Stated Goals  Pt unable to state.     Currently in Pain?  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, anoxic brain injury 2016;  s/p L ORIF hip 16/2019 and R hip replacement 04/12/2017    Referring Provider  Dr Levada Schilling    Onset Date/Surgical Date  01/12/15    Hand Dominance  Right    Prior Therapy  [facility] for 6 months, extensive outpatient therapy at this facility, inpt at [facility] and [facility]      Precautions   Precautions  Anterior Hip      Restrictions   Weight Bearing Restrictions  No      Balance Screen   Has the patient fallen in the past 6 months  No      Home  Environment   Family/patient expects to be discharged to:  Private residence see prior evaluations    Additional Comments  Pt with roll in shower seat and steady assist for toilet transfers      Prior Function   Level of [place]  Retired    [employer]  retired Holiday representative      ADL   Eating/Feeding  + 1  Total assistance  DII and nectar thick    Grooming  + 1 Total assistance    Upper Body Bathing  + 2 Total assistance    Lower Body Bathing  + 2 Total assistance    Upper Body Dressing  + 1 Total assistance    Lower Body Dressing  +1 Total aassistance    Toilet Transfer  + 1 Total assistance uses steady    Toileting - Clothing Manipulation  + 1 Total assistance    Toileting -  Hygiene  + 1 Total assistance    Tub/Shower Transfer  + 1 Total assistance    ADL comments  2 person assist for showering. Pt participates in UB dressing to limited degree       Vision - History   Additional Comments  Pt with signficant visual disturbance but difficutly to assess.  See prior evaluations and notes      Activity Tolerance   Sitting Balance  Performs 25%or less sitting activity      Cognition   Overall Cognitive Status  History of cognitive impairments - at baseline    Mini Mental State Exam   See prior evaluations and notes      Posture/Postural Control   Posture/Postural Control  Postural limitations    Posture Comments  severe postural limitations - see prior notes.      Coordination   Gross Motor Movements are Fluid and Coordinated  No    Fine Motor Movements are Fluid and Coordinated  No    Other  unable to participate in standardized tests      Perception   Perception  Impaired      Praxis   Praxis  Impaired      Tone   Assessment Location  Right Upper Extremity;Left Upper Extremity      ROM / Strength   AROM / PROM / Strength  AROM;PROM;Strength      AROM   Overall AROM   Deficits    Overall AROM Comments  RUE:  shoulder flexion to 30*, abduction to 30*, full elbow flexion/extension, full wrist flexion/extension. limited finger extension, full finger flexion.  LUE:  shoulder flexion to 40*, abduction to 80*, elbow extension 0-70* with contracture at end range, wrist extension to -5, limited finger extension, full finger flexion      PROM   Overall PROM  Deficits    Overall PROM Comments  RUE  shoulder flexion and abduction to 90*.  LUE flexion to 70* abduction to 90*, elbow extension 0-90*      Strength   Overall Strength  Deficits;Unable to assess    Overall Strength Comments  unable to assess due to contractures, altered tone and mental status      Hand Function   Right Hand Gross Grasp  Impaired    Left Hand Gross Grasp  Impaired      RUE Tone   RUE Tone  Hypertonic      LUE Tone   LUE Tone  Hypertonic                        OT Short Term Goals - 05/10/17 1604      OT SHORT TERM GOAL #1   Title  Pt will be mod a for sit to stand from toilet in steady to reduce care giver burden. - 06/07/2017    Status  New      OT SHORT TERM GOAL #2   Title  Pt will sit on level surface with 1 UE support only in preparation for participation in basic self care tasks for at least 3 minutes consistently    Status  New      OT SHORT TERM GOAL #3   Title  Pt will maintain static standing balance with BUE support during ADL activity  with mod a for at least 3 minutes consistently    Status  New      OT SHORT TERM GOAL #4   Title  Pt will roll during LB dressing with max a x1 to reduce caregiver burden.     Status  New        OT Long Term Goals - 05/10/17 1607      OT LONG TERM GOAL #1   Title  Pt will go from sit to stand from toilet with steady with min a to reduce caregiver burden - 07/05/2017    Status  New      OT LONG TERM GOAL #2   Title  Pt will sit on level surfaces for participation in basic self care tasks with no UE support for at least 5 minutes    Status  New      OT LONG TERM GOAL #3   Title  Pt will maintain static standing balance with BUE support duirng ADL activity with min a for at least 3 minutes    Status  New      OT LONG TERM GOAL #4   Title  Pt wil roll during LB dressing with mod a x1 to reduce caregiver burden.              Plan - 05/10/17 1609    Clinical Impression Statement  Pt is a 67 year old female who was  diagnosed in 12/2015 with encephalitis due to [place]. Pt first began outpatient rehab 08/2015. Pt has had numerous hospitalizations over the past 2 years including recent L ORIF hip 01/2017 and R hip replacement 03/2017.  Pt returns today with the following deficits that impact all performance areas:  spastic tetraplegia, decreased A/RROM of all extremities, contracture of B shoulders as well as L elbow, decreased strength, abnormal posutre, severely impaired sitting and standing balance, impaired perception, impaired cognition, impaired vision. Pt will benefit from skilled OT to address these deficits to improve basic engagement/ability for ADL as well  as reduce caregiver burden.     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    Occupational performance deficits (Please refer to evaluation for details):  ADL's;IADL's;Rest and Sleep;Leisure;Social Participation;Work    Physicist, medical for specific goals    Current Impairments/barriers affecting progress:  severity of deficits, very slow rate of recovery; pt with recent significant decline over past several months due medical complications    OT Frequency  2x / week    OT Duration  8 weeks    OT Treatment/Interventions  Self-care/ADL training;Ultrasound;Moist Heat;Electrical Stimulation;DME and/or AE instruction;Neuromuscular education;Therapeutic exercise;Functional Mobility Training;Manual Therapy;Passive range of motion;Splinting;Therapeutic activities;Balance training;Patient/family education;Visual/perceptual remediation/compensation;Cognitive remediation/compensation    Plan  NMR for trunk, functional mobility, sitting balance, basic pt initiated movement.     Consulted and Agree with Plan of Care  Patient;Family  member/caregiver    Family Member Consulted  caregiver [place], hsuband [name]       Patient will benefit from skilled therapeutic intervention in order to improve the following deficits and impairments:  Decreased endurance, Decreased skin integrity, Impaired vision/preception, Improper body mechanics, Impaired perceived functional ability, Decreased knowledge of precautions, Cardiopulmonary status limiting activity, Decreased activity tolerance, Decreased knowledge of use of DME, Decreased strength, Impaired flexibility, Improper spinal/pelvic alignment, Impaired sensation, Difficulty walking, Decreased mobility, Decreased balance, Decreased cognition, Decreased range of motion, Impaired tone, Impaired UE functional use, Decreased safety awareness, Decreased coordination, Pain, Abnormal gait  Visit Diagnosis: Muscle weakness (generalized) - Plan: Ot plan of care cert/re-cert  Abnormal posture - Plan: Ot plan of care cert/re-cert  Other abnormalities of gait and mobility - Plan: Ot plan of care cert/re-cert  Other symptoms and signs involving the nervous system - Plan: Ot plan of care cert/re-cert  Stiffness of left shoulder, not elsewhere classified - Plan: Ot plan of care cert/re-cert  Stiffness of right shoulder, not elsewhere classified - Plan: Ot plan of care cert/re-cert  Stiffness of left elbow, not elsewhere classified - Plan: Ot plan of care cert/re-cert  Frontal lobe and executive function deficit - Plan: Ot plan of care cert/re-cert  Spastic tetraplegia ([place]) - Plan: Ot plan of care cert/re-cert    Problem List Patient Active Problem List   Diagnosis Date Noted  . Coagulase negative Staphylococcus bacteremia 02/09/2017  . Abdominal distension   . Chronic mucus hypersecretion, respiratory 12/31/2016  . Severe protein-calorie malnutrition [name]: less than 60% of standard weight) ([place]) 12/31/2016  . Acute urinary retention 12/31/2016  . Constipation 12/31/2016  . Shortness  of breath   . Respiratory distress   . Pneumonia 12/05/2016  . Acute respiratory failure ([place]) 12/05/2016  . Pseudomonas urinary tract infection 10/27/2016  . Severe muscle deconditioning 09/30/2016  . Pressure injury of skin 09/28/2016  . Respiratory failure ([place])   . Tracheobronchitis   . Urinary retention   . PEG (percutaneous endoscopic gastrostomy) status ([place])   . Benign essential HTN   . Hyponatremia   . Hypokalemia   . Reactive hypertension   . Respiratory insufficiency 09/24/2016  . Malignant neoplasm of upper-inner quadrant of left breast in female, estrogen receptor positive ([place]) 07/14/2016  . Heterotopic ossification of bone 03/10/2016  . Acute on chronic respiratory failure with hypoxia ([employer])   . Acute hypoxemic respiratory failure ([place])   . Acute blood loss anemia   . Atelectasis 02/04/2016  .  Debility 02/03/2016  . Pneumonia of both lower lobes due to Pseudomonas species ([place])   . Pneumonia of both lower lobes due to methicillin susceptible Staphylococcus aureus (MSSA) ([place])   . Acute respiratory failure with hypoxia ([place])   . Acute encephalopathy   . Seizures ([place])   . Sepsis ([place])   . Hypoxia   . Pain   . Chronic respiratory failure ([place]) 12/28/2015  . Acute tracheobronchitis 12/24/2015  . Tracheostomy tube present ([place])   . Tracheal stenosis   . Chronic respiratory failure with hypoxia ([place])   . Spastic tetraplegia ([place]) 10/20/2015  . Tracheostomy status ([place]) 09/26/2015  . Allergic rhinitis 09/26/2015  . 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 Movement disorder 09/22/2015  . Encephalitis 09/22/2015  . Chest pain 02/07/2014  . Premature atrial contractions 01/13/2014  . Abnormality of gait 12/04/2013  . Hyperlipidemia 12/21/2011  . Cardiovascular risk factor 12/21/2011  . Bunion 01/31/2008  . Metatarsalgia of both feet 09/20/2007  . FLAT FOOT 09/20/2007  . HALLUX RIGIDUS, ACQUIRED 09/20/2007    Quay Burow,  OTR/L 05/10/2017, 4:16 PM  [facility] [address] [place], [place], [address] Phone: [phone]   Fax:  [phone]  Name: Judy Spears MRN: [id number] Date of Birth: [birth year]

## 2017-05-10 NOTE — Patient Instructions (Signed)
From Discharge instructions at [facility]:  Diet:   Continue/consider alternative means of nutrition  Dysphagia Ground/Nectar thick liquids  Aspiration Precautions:  General Precautions: sit upright, slow rate, fully swallow bites/sips, only eat when awake/alert, assist prn  sit upright during all PO (as close to 90 degrees as possible)  only feed when awake/alert  fully swallow before taking the next bite or sip  take small bites and sips  alternate liquids and solids/puree  cough/clear throat intermittently during meal  Swallow saliva after throat clear/cough  monitor all PO intake  assist with all PO intake  avoid ice-cream, Jell-O, etc. which melt into a thin liquid in the mouth  patient to sit upright 30 minutes after eating  good oral care  Administer Medications: via alternate means  Regarding water/ice chips:   allow ice chips after good oral care for patient comfort and maintenance  ice chips should be provided in moderation  ice chips should be provided with assistance only  ice chips should be provided only between meals   allow sips of unthickened water after good oral care for patient comfort and maintenance  sips of unthickened water should be provided in moderation  sips of unthickened water should be provided with assistance only  sips of unthickened water should be provided only between meals

## 2017-05-11 ENCOUNTER — Other Ambulatory Visit: Payer: Self-pay | Admitting: Internal Medicine

## 2017-05-11 ENCOUNTER — Other Ambulatory Visit: Payer: Self-pay

## 2017-05-11 ENCOUNTER — Ambulatory Visit
Admission: RE | Admit: 2017-05-11 | Discharge: 2017-05-11 | Disposition: A | Discharge status: Home / Self Care | Payer: Medicare Other | Source: Ambulatory Visit | Attending: Internal Medicine | Admitting: Internal Medicine

## 2017-05-11 DIAGNOSIS — M549 Dorsalgia, unspecified: Secondary | ICD-10-CM

## 2017-05-11 NOTE — Therapy (Signed)
[facility] [address] [place], [place], [address] Phone: [phone]   Fax:  [phone]  Physical Therapy Evaluation  Patient Details  Name: Judy Spears MRN: [id number] Date of Birth: [birth year] Referring Provider: Dr. Levada Schilling   Encounter Date: 05/10/2017  PT End of Session - 05/11/17 2055    Visit Number  1    Number of Visits  17    Date for PT Re-Evaluation  07/09/17    Authorization Type  [employer] Medicare    Authorization Time Period  05-10-17 - 08-08-17    PT Start Time  1148    PT Stop Time  1225    PT Time Calculation (min)  37 min    Activity Tolerance  Patient limited by lethargy;Patient limited by pain       Past Medical History:  Diagnosis Date  . Arthritis    lt great toe  . Complication of anesthesia    pt has had headaches post op-did advise to hydra well preop  . Hair loss    right-sided  . History of exercise stress test    a. ETT (12/15) with 12:00 exercise, no ST changes, occasional PACs.   . Hyperlipidemia   . Insomnia   . Migraine headache   . Movement disorder    resltess in left legs  . Postmenopausal   . 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 Premature atrial contractions    a. Holter (12/15) with 8% PACs, no atrial runs or atrial fibrillation.     Past Surgical History:  Procedure Laterality Date  . BREAST BIOPSY  01/01/2011   Procedure: BREAST BIOPSY WITH NEEDLE LOCALIZATION;  Surgeon: Rolm Bookbinder, MD;  Location: [facility];  Service: General;  Laterality: Left;  left breast wire localization biopsy  . BREAST BIOPSY Right 07/27/2012  . BREAST BIOPSY Right 07/07/2016  . BREAST BIOPSY Left 06/30/2016  . cataracts right eye    . CESAREAN SECTION  1986  . HERNIA REPAIR  2000   RIH  . INTRAMEDULLARY (IM) NAIL INTERTROCHANTERIC Left 01/30/2017   Procedure: ORIF AFFIXUS NAIL;  Surgeon: Paralee Cancel, MD;  Location: [facility];  Service: Orthopedics;  Laterality:  Left;  . IR REPLC GASTRO/COLONIC TUBE PERCUT W/FLUORO  09/29/2016  . opirectinal membrane peel    . PEG PLACEMENT    . RHINOPLASTY  1983  . TRACHEOSTOMY    . TRACHEOSTOMY TUBE PLACEMENT N/A 12/15/2016   Procedure: TRACHEOSTOMY CHANGE;  Surgeon: [name] Montane, MD;  Location: [facility];  Service: ENT;  Laterality: N/A;    There were no vitals filed for this visit.   Subjective Assessment - 05/11/17 2045    Subjective  Pt is very groggy after having had ST and OT evals; [name], caregiver, reports she did not sleep well last night; pt also c/o some acute Lt sided back pain which [name] attributtes to possibly overstretching back when she was getting her to use ball in front, leaning forward;  pt is also accompanied by [name], her husband.; he reports pt had ORIF Lt hip on 02-09-17 and Rt THR on 04-12-17;  pt was seen on inpatient rehab at [facility] center at [facility]    Patient is accompained by:  Family member [name] and [name]    Pertinent History  [place]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 Pt also s/p ORIF L hip 02/09/2017 and R THR 04/12/2017    Referring Provider  Dr. Levada Schilling    Onset Date/Surgical Date  01/12/15    Hand Dominance  Right    Prior Therapy  Pt has received OP PT at this facility from Aug. 2017- Jan. 2018:  Feb. 2018- Sept. 2018:  Oct. 2018 - Nov. 2018 Therapy at [facility] x 6 months inpatietn      Precautions   Precautions  Anterior Hip for R hip replacement    Precaution Comments  PEG and trach dependent      Restrictions   Weight Bearing Restrictions  No      Prior Function   Level of Independence  Independent prior to illness    Vocation  Retired    [employer]  retired Social research officer, government   Posture/Postural Control  Postural limitations    Posture Comments  Pt leans toward left side      Tone   Assessment Location  Right Lower Extremity;Left Lower Extremity      ROM / Strength   AROM / PROM / Strength  AROM      AROM   Overall AROM Comments  attempted to assess hip ROM in supine but pt started crying, complaining of back pain so she was immediately assisted back to sitting position      Strength   Overall Strength  Deficits;Unable to assess    Overall Strength Comments  unable to assess due to tone and cognitive deficits       Ambulation/Gait   Ambulation/Gait  Yes    Ambulation/Gait Assistance  1: +2 Total assist    Ambulation/Gait Assistance Details  +2 total assist     Ambulation Distance (Feet)  10 Feet    Assistive device  2 person hand held assist total assist    Gait Pattern  Decreased dorsiflexion - right;Decreased dorsiflexion - left;Decreased hip/knee flexion - right;Decreased hip/knee flexion - left;Trunk flexed;Poor foot clearance - left;Poor foot clearance - right    Ambulation Surface  Level;Indoor      Balance   Balance Assessed  Yes      Static Sitting Balance   Static Sitting - Level of Assistance  4: Min assist;5: Stand by assistance    Static Sitting - Comment/# of Minutes  approx. 2"      RLE Tone   RLE Tone  Hypertonic      LLE Tone   LLE Tone  Hypertonic                Objective measurements completed on examination: See above findings.                PT Short Term Goals -  05/11/17 2113      PT SHORT TERM GOAL #1   Title  Perform sit to/from  stand from wheelchair or mat with +1 mod assist.    Baseline  max to total assist on 05-11-17    Time  4    Period  Weeks    Status  New    Target Date  06/09/17      PT SHORT TERM GOAL #2   Title  Pt will perform sit to stand from wheelchair to RW with max assist for pre-gait skill.    Baseline  max to total assist +2 hand held assist - 05-10-17    Time  4    Period  Weeks    Status  New    Target Date  06/09/17      PT SHORT TERM GOAL #3   Title  Pt will transfer supine to sit with max assist.    Baseline  total assist on 05-10-17    Time  4    Period  Weeks    Status  New    Target Date  06/09/17      PT SHORT TERM GOAL #4   Title  Pt will sit with RUE support with min to mod assist unsupported on side of mat for at least 3" to demo improved trunk strength and core stabilization.    Baseline  performance varies - pt able to sit with RUE support for approx. 1" with CGA to max assist with LOB     Time  4    Period  Weeks    Status  New    Target Date  06/09/17      PT SHORT TERM GOAL #5   Title  Pt will amb. 82' with appropriate assistive device with max assist +1    Baseline  total assist +2 -    05-10-17    Time  4    Period  Weeks    Status  New    Target Date  06/09/17        PT Long Term Goals - 05/11/17 2122      PT LONG TERM GOAL #1   Title  Pt will perform supine rolling R and L with min assist  to indicate improved independence with bed mobility.      Baseline  dependent for all bed mobility per spouse's report - 05-10-17    Time  8    Period  Weeks    Status  New    Target Date  07/09/17      PT LONG TERM GOAL #2   Title  Pt will sit on side of mat with RUE support for at least 5" with SBA     Baseline  max assist to min assist - ability varies    Time  8    Period  Weeks    Status  New    Target Date  07/09/17      PT LONG TERM GOAL #3   Title  Pt will amb. 20' with appropriate assistive device with +1 max assist.    Baseline  +2 max - total  assist- pt unable to advance LE's - feet not clearing due to pt not wearing AFO's - 05-10-17    Time  8    Period  Weeks    Status  New    Target Date  07/09/17      PT LONG TERM GOAL #4   Title  Pt will perform basic transfers with min to mod assist to indicate improved functional independence.      Baseline  max assist - 05-10-17    Time  8    Period  Weeks    Target Date  07/09/17      PT LONG TERM GOAL #5   Title  Pt will perform static standing x 3" with bil. UE support with mod assist.      Time  8    Period  Weeks    Status  New    Target Date  07/09/17      Additional Long Term Goals   Additional Long Term Goals  Yes      PT LONG TERM GOAL #6   Title  Pt will be able to return to participation in therapeutic horseback riding at [place] for ongoing exercise program.    Time  8    Period  Weeks    Status  New    Target Date  07/09/17             Plan - 05/11/17 2057    Clinical Impression Statement  Pt is a 67 yr old lady who presents to OP PT for another admission after having had acute respiratory failure with hospital admission on 01-24-17, s/p ORIF Lt hip on 02-09-17 and Rt THR on 04-12-17; pt was last seen at this facility in Nov. 2018.  Pt presents with dependencies in all mobility, total assist +2 for ambulation, and decreased sitting balance.  Pt is unable to stand and sit unsupported.      History and Personal Factors relevant to plan of care:  pt has had extensive OP PT at this facility - from Aug. 2017 - early Jan. 2018:  Feb. 2018 - sept. 2018:  Oct. 2018 til Nov. 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; hypoxic brain injury    Clinical Decision Making  Moderate    Rehab Potential  Fair    Clinical Impairments Affecting Rehab Potential  severity of deficits with previous extensive thereapies    PT Frequency  2x / week    PT Duration  8 weeks    PT Treatment/Interventions  ADLs/Self  Care Home Management;Therapeutic exercise;Therapeutic activities;Functional mobility training;Gait training;Balance training;Neuromuscular re-education;Patient/family education;Orthotic Fit/Training;Passive range of motion    PT Next Visit Plan  transfer training, sitting balance; can attempt supine if pt able to tolerate based on back pain; standing as tolerated; [name] to bring her AFO's to next session (did not have at eval)    PT Home Exercise Plan  caregiver and husband have previously been instructed in ROM, sitting balance, and transfer training    Consulted and Agree with Plan of Care  Patient       Patient will benefit from skilled therapeutic intervention in order to improve the following deficits and impairments:  Abnormal gait, Decreased activity tolerance, Decreased balance, Decreased cognition, Decreased coordination, Decreased mobility, Decreased range of motion, Decreased endurance, Hypomobility, Decreased strength, Impaired flexibility, Increased muscle spasms, Impaired UE functional use, Postural dysfunction, Pain  Visit Diagnosis: Muscle weakness (generalized) - Plan: PT plan of care cert/re-cert  Other abnormalities of gait and mobility - Plan: PT plan of care cert/re-cert  Other symptoms and signs involving the nervous system - Plan: PT plan of care cert/re-cert  Abnormal posture - Plan: PT plan of care cert/re-cert  Unsteadiness on feet - Plan:  PT plan of care cert/re-cert  Other lack of coordination - Plan: PT plan of care cert/re-cert  Other abnormal involuntary movements - Plan: PT plan of care cert/re-cert     Problem List Patient Active Problem List   Diagnosis Date Noted  . Coagulase negative Staphylococcus bacteremia 02/09/2017  . Abdominal distension   . Chronic mucus hypersecretion, respiratory 12/31/2016  . Severe protein-calorie malnutrition [name]: less than 60% of standard weight) ([place]) 12/31/2016  . Acute urinary retention 12/31/2016  .  Constipation 12/31/2016  . Shortness of breath   . Respiratory distress   . Pneumonia 12/05/2016  . Acute respiratory failure ([place]) 12/05/2016  . Pseudomonas urinary tract infection 10/27/2016  . Severe muscle deconditioning 09/30/2016  . Pressure injury of skin 09/28/2016  . Respiratory failure ([place])   . Tracheobronchitis   . Urinary retention   . PEG (percutaneous endoscopic gastrostomy) status ([place])   . Benign essential HTN   . Hyponatremia   . Hypokalemia   . Reactive hypertension   . Respiratory insufficiency 09/24/2016  . Malignant neoplasm of upper-inner quadrant of left breast in female, estrogen receptor positive ([place]) 07/14/2016  . Heterotopic ossification of bone 03/10/2016  . Acute on chronic respiratory failure with hypoxia ([place])   . Acute hypoxemic respiratory failure ([place])   . Acute blood loss anemia   . Atelectasis 02/04/2016  . Debility 02/03/2016  . Pneumonia of both lower lobes due to Pseudomonas species ([place])   . Pneumonia of both lower lobes due to methicillin susceptible Staphylococcus aureus (MSSA) ([place])   . Acute respiratory failure with hypoxia ([place])   . Acute encephalopathy   . Seizures ([place])   . Sepsis ([place])   . Hypoxia   . Pain   . Chronic respiratory failure ([place]) 12/28/2015  . Acute tracheobronchitis 12/24/2015  . Tracheostomy tube present ([place])   . Tracheal stenosis   . Chronic respiratory failure with hypoxia ([place])   . 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 Tracheostomy status ([place]) 09/26/2015  . Allergic rhinitis 09/26/2015  . 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 Movement disorder 09/22/2015  . Encephalitis 09/22/2015  . Chest pain 02/07/2014  . Premature atrial contractions 01/13/2014  . Abnormality of gait 12/04/2013  . Hyperlipidemia 12/21/2011  . Cardiovascular risk factor 12/21/2011  . Bunion 01/31/2008  . Metatarsalgia of both feet 09/20/2007  . FLAT FOOT 09/20/2007  . HALLUX RIGIDUS, ACQUIRED 09/20/2007     Alda Lea, PT 05/11/2017, 9:37 PM  [facility] [address] [place], [place], [address] Phone: [phone]   Fax:  [phone]  Name: Judy Spears MRN: [id number] Date of Birth: 10/21/1950

## 2017-05-11 NOTE — Therapy (Signed)
[facility] [address] [place], [place], [address] Phone: [phone]   Fax:  [phone]  Speech Language Pathology Evaluation  Patient Details  Name: Judy Spears MRN: [id number] Date of Birth: November 05, 1950 Referring Provider: Levada Schilling, MD   Encounter Date: 05/10/2017  End of Session - 05/11/17 0818    Visit Number  1    Number of Visits  17    Date for SLP Re-Evaluation  07/22/17    SLP Start Time  1062 pt in restroom at 19    SLP Stop Time   1101    SLP Time Calculation (min)  43 min    Activity Tolerance  Patient tolerated treatment well       Past Medical History:  Diagnosis Date  . Arthritis    lt great toe  . Complication of anesthesia    pt has had headaches post op-did advise to hydra well preop  . Hair loss    right-sided  . History of exercise stress test    a. ETT (12/15) with 12:00 exercise, no ST changes, occasional PACs.   . Hyperlipidemia   . Insomnia   . Migraine headache   . Movement disorder    resltess in left legs  . Postmenopausal   . 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 Premature atrial contractions    a. Holter (12/15) with 8% PACs, no atrial runs or atrial fibrillation.     Past Surgical History:  Procedure Laterality Date  . BREAST BIOPSY  01/01/2011   Procedure: BREAST BIOPSY WITH NEEDLE LOCALIZATION;  Surgeon: Rolm Bookbinder, MD;  Location: [facility];  Service: General;  Laterality: Left;  left breast wire localization biopsy  . BREAST BIOPSY Right 07/27/2012  . BREAST BIOPSY Right 07/07/2016  . BREAST BIOPSY Left 06/30/2016  . cataracts right eye    . CESAREAN SECTION  1986  . HERNIA REPAIR  2000   RIH  . INTRAMEDULLARY (IM) NAIL INTERTROCHANTERIC Left 01/30/2017   Procedure: ORIF AFFIXUS NAIL;  Surgeon: Paralee Cancel, MD;  Location: [facility];  Service: Orthopedics;  Laterality: Left;  . IR REPLC GASTRO/COLONIC TUBE PERCUT W/FLUORO  09/29/2016  .  opirectinal membrane peel    . PEG PLACEMENT    . RHINOPLASTY  1983  . TRACHEOSTOMY    . TRACHEOSTOMY TUBE PLACEMENT N/A 12/15/2016   Procedure: TRACHEOSTOMY CHANGE;  Surgeon: [name] Montane, MD;  Location: [facility];  Service: ENT;  Laterality: N/A;    There were no vitals filed for this visit.  Subjective Assessment - 05/10/17 1129    Subjective  "PT, (2 seconds) OT" (aphonic) pt response to SLP "What other therapies do you have today?"    Patient is accompained by:  Family member Husband Judy Spears and Judy Spears caregiver    Currently in Pain?  Yes    Pain Score  5     Pain Location  Back    Pain Orientation  Mid    Pain Descriptors / Indicators  Sharp    Pain Type  Chronic pain    Pain Onset  In the past 7 days    Aggravating Factors   movment    Pain Relieving Factors  icy hot         SLP Evaluation [facility] - 05/11/17 0001      SLP Visit Information   SLP Received On  05/10/17    Referring Provider  Levada Schilling, MD    Onset Date  Dec 2016  Medical Diagnosis  Powassan Virus encephalitis      Pain Assessment   Pain Onset  In the past 7 days      General Information   HPI  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 She underwent a prolonged acute hospital course and subsequent inpatient rehab and day-program course through [facility] in [place], [place]. Pt initially seen for OP treatment at [facility] by SLP beginning 09/23/15 through 12/26/15. She was rehospitalized at [facility] 12/28/2015 for acute respiratory failure due to Pseudomonas and MSSA multilobar HCAP, with subsequent CIR at [facility] 02/05/16- 02/24/16. OP SLP 03/01/16-08/26/16 during which pt advanced to consuming pleasure feeds of soft solids and thin liquids with family (SLP recommended dys1/thin) and was  communicating verbally in short sentences with usual min A for intelligibility strategies from commmunication partners. Pt was diagnosed with breast cancer in June 2018, with lumpectomy and treatment with anastrozole; experienced subsequent decline, hospitalized due to MRSA tracheobronchitis 09/24/2016 - 10/27/2016 including inpt rehab stay. 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 Patient had head CT 10/04/16 showing advanced bifrontal atrophy seen with neuro degenerative disease, advanced cerebellar atrophy; MRI scan of the brain performed on 10/09/16 which shows generalized atrophy and moderate chronic small vessel ischemic changes. Returns today for evaluation. She was admitted 01-24-17 to 02-02-17 with hypoxia, altered mental status, and recurrent mucous plugging (previous MRSA and pseudomonas) and was started on vanco/cefepime. While in the hospital lt hip fx was found and she underwent sx in January. A rt hip rx was found later and repaired on 04-12-17 at [facility] had skilled ST while an inpatient in that facility and was d/c'd 04-29-17. SLP note 04-15-17 after objective swallow eval reports up to a ground meat diet/nectar thick liquid was recommended, with ice chips and/or water between meals-after oral care, no mixed consistencies       Prior Functional Status   Cognitive/Linguistic Baseline  Baseline deficits    Baseline deficit details  cognition, communication, dysphagia    Type of Home  House     Lives With  Spouse    Available Support  Family;Personal care attendant    Education  JD    Vocation  On disability      Cognition   Overall Cognitive Status  Impaired/Different from baseline    Area of Impairment  Orientation;Attention;Memory;Following commands;Awareness    General Comments  severity of cognitive deficits varies slightly on a daily basis based on pt's level of alertness    Current Attention Level  Focused;Sustained with intermittent somnolence     Memory  Decreased recall of precautions;Decreased short-term memory    Awareness  Intellectual    Awareness Comments  pt did not demo awareness of swallow precautions; did not demo awareness of errors (incoherently spelled a name of a grandchild as "herrihson" and denied trying to spell "Aline [employer]chure")      Auditory Comprehension   Overall Auditory Comprehension  Impaired at baseline    Yes/No Questions  Impaired    Other Yes/No Questions Comments`  -- logical yes/no 88% ([place])    Conversation  Other (comment)    Other Conversation Comments  pt able to demo understanding of simple conversation (1-2 word responses) for approx 3 conversational turns during eval today however decr'd attention  makes further functional conversation challenging.    Interfering Components  Attention;Processing speed level of alertness/arousal      Verbal Expression   Overall Verbal Expression  Impaired    Initiation  Impaired dependent somewhat on fatigue    Level of Generative/Spontaneous Verbalization  Conversation    Interfering Components  Attention;Speech intelligibility;Anatomical limitation level of previous impairment    Effective Techniques  -- extra time to communicate    Other Verbal Expression Comments  when alert, pt held light conversation with SLP for 3 conversational turns      Oral Motor/Sensory Function   Overall Oral Motor/Sensory Function  Impaired at baseline    Labial ROM  Reduced right;Reduced left    Labial Strength  Reduced    Lingual ROM  Reduced right;Reduced left    Lingual Strength  Reduced    Overall Oral Motor/Sensory Function  Repeated 2 syllable words 1/6 intelligible, 5/6 with voicing; spontaneous 3-syllable responses 0% intelligibilty, 50% voicing (improved to 70% with SLP cues to repeat); spontaneous 2-syllable utterances 0/1 intelligible, 1/1 with voice; repsonses to yes/no questions: 7/8 intelligible      Motor Speech   Overall Motor Speech  Impaired    Respiration   Impaired    Level of Impairment  Word    Phonation  Wet;Aphonic    Articulation  Impaired    Level of Impairment  Word    Intelligibility  Intelligibility reduced family and caregiver intelligibility is greater than SLP    Word  0-24% accurate    Effective Techniques  Slow rate;Increased vocal intensity;Over-articulate;Pacing min improvement-verbal cues for one syllable/word at a time     Phonation  Impaired    Volume  Soft    Pitch  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%)-worse with fatigue                      SLP Education - 05/10/17 1710    Education provided  Yes    Education Details  swallow precautions and diet recommendations from [facility] (copied/pasted to pt instructions), results from ST eval, 8-weeks initial therapy on speech intelligibility with dysphagia and dysarthria exercises at home with monitoring by SLP    Person(s) Educated  Patient;Spouse;Caregiver(s)    Methods  Explanation;Handout    Comprehension  Verbalized understanding;Need further instruction (further instruction-pt)   (further instruction-pt)      SLP Short Term Goals - 05/11/17 0959      SLP SHORT TERM GOAL #1   Title  Pt will demonstrate dysarthria HEP to increase articulatory coordination for speech with usual mod A over 3 sessions    Time  4    Period  Weeks or 8 sessions, for all STGs    Status  New      SLP SHORT TERM GOAL #2   Title  Pt will achieve phonation adequate for SLP intelligibility in imitated two-syllable words using intelligibility compensations 12/20 trials with usual min A in 3 sessions    Time  4    Period  Weeks    Status  New      SLP SHORT TERM GOAL #3   Title  Pt will imitate 2-syllable words or phrases with functional intelligibility for SLP in 5/15 trials over 3 sessions     Time  4    Period  Weeks    Status  New      SLP SHORT TERM GOAL #4   Title  Pt will sustain attention to therapy tasks for  10 minutes with multimodal cues in 5 sessions     Time  4    Period  Weeks    Status  New      SLP SHORT TERM GOAL #5   Title  pt family will demo understanding of oral care regimen, including safe ingestion of water/ice chips over three sessions    Time  4    Period  Weeks    Status  New      SLP SHORT TERM GOAL #6   Title  pt will demo dysphagia HEP to improve management of secretions and safety with any possible POs    Time  4    Period  Weeks    Status  New      SLP SHORT TERM GOAL #7   Title  family/caregivers will demo understanding of pt swallowing precautions, for pt safety with POs, by verbal report     Time  4    Period  Weeks    Status  New       SLP Long Term Goals - 05/11/17 1232      SLP LONG TERM GOAL #1   Title  Pt will demo dysarthria  HEP to incr articulatory coordination for speech, with occasional mod A over three sessions    Time  8    Period  Weeks or 17 sessions, for all LTGs    Status  New      SLP LONG TERM GOAL #2   Title  pt will achieve phonation adequate for SLP intelligibility in imitated 3-syllable words/3-word phrases using intelligibility compenastions 15/20 trials    Time  8    Period  Weeks    Status  New      SLP LONG TERM GOAL #3   Title  Pt will imitate 2-syllable words or phrases with functional intelligibility for SLP in 8/15 trials over three sessions    Time  8    Period  Weeks    Status  New      SLP LONG TERM GOAL #4   Title  pt will sustain attention to therapy tasks for 10 minutes with multimodal cues in 10 sessions    Time  8    Period  Weeks    Status  New      SLP LONG TERM GOAL #5   Title  pt will demo dysphagia HEP to improve management of secretions and safety with possible POs    Time  8    Period  Weeks    Status  New       Plan - 05/11/17 0820    Clinical Impression Statement  Patient presents with ongoing deficits in speech intelligibility (dysarthria, reduced breath support), cognitive communication and swallowing. Pt is well-known to SLPs at this  facility and has been seen for OP ST for multiple plans of care since August 2017. Pt's alertness was WNL for approx 15 minutes and then pt became less alert/aroused and was intermittently somnolent. SLP discussed pt and family desired goals and agreed that it would be best for family/caregivers to provide pt with assistance with dysphagia and dysarthria exercises at home after review with SLP, and for pt and SLP to focus on speech intelligibility during ST sessions for this plan of care. SLP told pt,family, and caregivers that therapy would be for 8 weeks, and pt progress would be assessed sometime in last 2 weeks of the plan of care and determination by SLP would be made if  pt may benefit from further ST. If pt would not benefit, SLP told pt/family/caregivers that SLP would provide a home program for family to complete with pt, and that regular assessment by ST may be necessary approx every 3-6 months to update the program. Pt did not readily spontaneously manage her own secretions during eval and was frequently hydrophonic. Family has suction at home and if pt cannot clear secretions with a o swallow family uses suction or a sip of water. SLP reminded family that water/ice chips are only recommended if pt has had thorough oral care. SLP also reminded pt/family/caregiver that pt should only have POs when alert/awake; Family mentioned that pt's PO intake is primarily for the evening meal, and previously caregiver/husband had stated pt alertness/arousal is better in the mornings. SLP suggested as possible that pt receive POs in the morning. Recommend direct skilled ST to address pt's decline in communication skills, and to monitor a HEP for dysphagia and dysarthria in order to maximize functional communication, secretion management/ swallowing safety, reduce caregiver burden and improve quality of life.    Speech Therapy Frequency  2x / week    Duration  -- 8 weeks or 17 visits    Treatment/Interventions   Aspiration precaution training;Trials of upgraded texture/liquids;Compensatory strategies;Functional tasks;Patient/family education;Cueing hierarchy;Diet toleration management by SLP;Cognitive reorganization;Compensatory techniques;SLP instruction and feedback;Internal/external aids;Multimodal communcation approach;Pharyngeal strengthening exercises;Oral motor exercises any, any combination, or all may be used    Potential to Achieve Goals  Fair    Potential Considerations  Ability to learn/carryover information;Co-morbidities;Severity of impairments;Cooperation/participation level;Previous level of function variability day to day of pt's alertness/arousal level    SLP Home Exercise Plan  provided effortful swallow and open mouth exercises today, full HEPs to follow during first ST session/s    Consulted and Agree with Plan of Care  Patient    Family Member Consulted  Husband, Judy Spears caregiver [name] also present       Patient will benefit from skilled therapeutic intervention in order to improve the following deficits and impairments:   Dysarthria and anarthria  Dysphagia, oropharyngeal phase  Cognitive communication deficit  Aphasia    Problem List Patient Active Problem List   Diagnosis Date Noted  . Coagulase negative Staphylococcus bacteremia 02/09/2017  . Abdominal distension   . Chronic mucus hypersecretion, respiratory 12/31/2016  . Severe protein-calorie malnutrition [name]: less than 60% of standard weight) ([place]) 12/31/2016  . Acute urinary retention 12/31/2016  . Constipation 12/31/2016  . Shortness of breath   . Respiratory distress   . Pneumonia 12/05/2016  . Acute respiratory failure ([place]) 12/05/2016  . Pseudomonas urinary tract infection 10/27/2016  . Severe muscle deconditioning 09/30/2016  . Pressure injury of skin 09/28/2016  . Respiratory failure ([place])   . Tracheobronchitis   . Urinary retention   . PEG (percutaneous endoscopic gastrostomy) status ([place])   .  Benign essential HTN   . Hyponatremia   . Hypokalemia   . Reactive hypertension   . Respiratory insufficiency 09/24/2016  . Malignant neoplasm of upper-inner quadrant of left breast in female, estrogen receptor positive ([place]) 07/14/2016  . Heterotopic ossification of bone 03/10/2016  . Acute on chronic respiratory failure with hypoxia ([place])   . Acute hypoxemic respiratory failure ([place])   . Acute blood loss anemia   . Atelectasis 02/04/2016  . Debility 02/03/2016  . Pneumonia of both lower lobes due to Pseudomonas species ([place])   . Pneumonia of both lower lobes due to methicillin susceptible Staphylococcus aureus (MSSA) ([place])   .  Acute respiratory failure with hypoxia ([place])   . Acute encephalopathy   . Seizures ([place])   . Sepsis ([place])   . Hypoxia   . Pain   . Chronic respiratory failure ([place]) 12/28/2015  . Acute tracheobronchitis 12/24/2015  . Tracheostomy tube present ([place])   . Tracheal stenosis   . Chronic respiratory failure with hypoxia ([place])   . 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 Tracheostomy status ([place]) 09/26/2015  . Allergic rhinitis 09/26/2015  . 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 Movement disorder 09/22/2015  . Encephalitis 09/22/2015  . Chest pain 02/07/2014  . Premature atrial contractions 01/13/2014  . Abnormality of gait 12/04/2013  . Hyperlipidemia 12/21/2011  . Cardiovascular risk factor 12/21/2011  . Bunion 01/31/2008  . Metatarsalgia of both feet 09/20/2007  . FLAT FOOT 09/20/2007  . HALLUX RIGIDUS, ACQUIRED 09/20/2007    [facility] ,MS, CCC-SLP  05/11/2017, 12:47 PM  [facility] [address] [place], [place], [address] Phone: [phone]   Fax:  [phone]  Name: Judy Spears MRN: [id number] Date of Birth: January 01, 1951

## 2017-05-16 ENCOUNTER — Ambulatory Visit: Payer: Medicare Other | Admitting: Occupational Therapy

## 2017-05-16 ENCOUNTER — Encounter: Payer: Self-pay | Admitting: Occupational Therapy

## 2017-05-16 ENCOUNTER — Ambulatory Visit: Payer: Medicare Other | Admitting: Speech Pathology

## 2017-05-16 DIAGNOSIS — R2681 Unsteadiness on feet: Secondary | ICD-10-CM

## 2017-05-16 DIAGNOSIS — M6281 Muscle weakness (generalized): Secondary | ICD-10-CM | POA: Diagnosis not present

## 2017-05-16 DIAGNOSIS — R278 Other lack of coordination: Secondary | ICD-10-CM

## 2017-05-16 DIAGNOSIS — R1312 Dysphagia, oropharyngeal phase: Secondary | ICD-10-CM

## 2017-05-16 DIAGNOSIS — R29818 Other symptoms and signs involving the nervous system: Secondary | ICD-10-CM

## 2017-05-16 DIAGNOSIS — M25622 Stiffness of left elbow, not elsewhere classified: Secondary | ICD-10-CM

## 2017-05-16 DIAGNOSIS — G8250 Quadriplegia, unspecified: Secondary | ICD-10-CM

## 2017-05-16 DIAGNOSIS — R41844 Frontal lobe and executive function deficit: Secondary | ICD-10-CM

## 2017-05-16 DIAGNOSIS — R258 Other abnormal involuntary movements: Secondary | ICD-10-CM

## 2017-05-16 DIAGNOSIS — R471 Dysarthria and anarthria: Secondary | ICD-10-CM

## 2017-05-16 DIAGNOSIS — R41841 Cognitive communication deficit: Secondary | ICD-10-CM

## 2017-05-16 DIAGNOSIS — R293 Abnormal posture: Secondary | ICD-10-CM

## 2017-05-16 NOTE — Therapy (Signed)
[facility] [address] [place], [place], [address] Phone: [phone]   Fax:  [phone]  Speech Language Pathology Treatment  Patient Details  Name: Judy Spears MRN: [id number] Date of Birth: September 27, 1950 Referring Provider: Levada Schilling, MD   Encounter Date: 05/16/2017  End of Session - 05/16/17 1757    Visit Number  2    Number of Visits  17    Date for SLP Re-Evaluation  07/22/17    SLP Start Time  5053    SLP Stop Time   1445    SLP Time Calculation (min)  40 min    Activity Tolerance  Patient limited by lethargy       Past Medical History:  Diagnosis Date  . Arthritis    lt great toe  . Complication of anesthesia    pt has had headaches post op-did advise to hydra well preop  . Hair loss    right-sided  . History of exercise stress test    a. ETT (12/15) with 12:00 exercise, no ST changes, occasional PACs.   . Hyperlipidemia   . Insomnia   . Migraine headache   . Movement disorder    resltess in left legs  . Postmenopausal   . 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 Premature atrial contractions    a. Holter (12/15) with 8% PACs, no atrial runs or atrial fibrillation.     Past Surgical History:  Procedure Laterality Date  . BREAST BIOPSY  01/01/2011   Procedure: BREAST BIOPSY WITH NEEDLE LOCALIZATION;  Surgeon: Rolm Bookbinder, MD;  Location: [facility];  Service: General;  Laterality: Left;  left breast wire localization biopsy  . BREAST BIOPSY Right 07/27/2012  . BREAST BIOPSY Right 07/07/2016  . BREAST BIOPSY Left 06/30/2016  . cataracts right eye    . CESAREAN SECTION  1986  . HERNIA REPAIR  2000   RIH  . INTRAMEDULLARY (IM) NAIL INTERTROCHANTERIC Left 01/30/2017   Procedure: ORIF AFFIXUS NAIL;  Surgeon: Paralee Cancel, MD;  Location: [facility];  Service: Orthopedics;  Laterality: Left;  . IR REPLC GASTRO/COLONIC TUBE PERCUT W/FLUORO  09/29/2016  . opirectinal membrane peel     . PEG PLACEMENT    . RHINOPLASTY  1983  . TRACHEOSTOMY    . TRACHEOSTOMY TUBE PLACEMENT N/A 12/15/2016   Procedure: TRACHEOSTOMY CHANGE;  Surgeon: [name] Montane, MD;  Location: [facility];  Service: ENT;  Laterality: N/A;    There were no vitals filed for this visit.  Subjective Assessment - 05/16/17 1755    Subjective  Pt's greeting to SLP aphonic    Patient is accompained by:  Family member Husband [name]    Currently in Pain?  No/denies            ADULT SLP TREATMENT - 05/16/17 1400      General Information   Behavior/Cognition  Lethargic;Pleasant mood    Patient Positioning  Upright in chair    Oral care provided  Yes      Treatment Provided   Treatment provided  Cognitive-Linquistic      Pain Assessment   Pain Assessment  No/denies pain      Cognitive-Linquistic Treatment   Treatment focused on  Dysarthria;Patient/family/caregiver education    Skilled Treatment  Pt with hydrophonic voice when presenting to [place] room. SLP cued throat clear, reswallow. Husband left room and returned with a cup of water; SLP questioned whether oral care performed prior to [place] appointment. He stated last oral  care was earlier this morning. SLP reinforced precautions for water protocol, educating re: water/ice relatively benign but must complete thorough oral care within 30 minutes prior due to aspiration risk of oral bacteria. SLP performed oral care with toothette, encouraging husband to use oral suction for mouth care prior to giving water. Husband administered 2 sips of water. Will continue to reinforce recommendations and aspiration precautions. Pt imitated 2 syllable words achieving adequate phonation in 50% of attempts, adequate articulation in 25% of attempts. Performance varied and was largely dependent on alertness. Pt somnolent for most of session; mod-max cues (tactile, pt's husband assisting into standing position) were sufficient to rouse pt for brief periods, lasting on average about 60  seconds. Pt most engaged when SLP initiated personal discussion re: her upbringing in [place]. 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 [employer]."       Assessment / Recommendations / [place] with current plan of care      Progression Toward Goals   Progression toward goals  Progressing toward goals       SLP Education - 05/16/17 1756    Education provided  Yes    Education Details  Perform thorough oral care regimen within 30 minutes of giving ice chips or water due to risk of aspiration of oral bacteria    Person(s) Educated  Patient;Spouse    Methods  Explanation    Comprehension  Verbalized understanding;Need further instruction       SLP Short Term Goals - 05/16/17 1758      SLP SHORT TERM GOAL #1   Title  Pt will demonstrate dysarthria HEP to increase articulatory coordination for speech with usual mod A over 3 sessions    Time  4    Period  Weeks    Status  On-going      SLP SHORT TERM GOAL #2   Title  Pt will achieve phonation adequate for SLP intelligibility in imitated two-syllable words using intelligibility compensations 12/20 trials with usual min A in 3 sessions    Time  4    Period  Weeks    Status  On-going      SLP SHORT TERM GOAL #3   Title  Pt will imitate 2-syllable words or phrases with functional intelligibility for SLP in 5/15 trials over 3 sessions     Time  4    Period  Weeks    Status  On-going      SLP SHORT TERM GOAL #4   Title  Pt will sustain attention to therapy tasks for 10 minutes with multimodal cues in 5 sessions    Time  4    Period  Weeks    Status  On-going      SLP SHORT TERM GOAL #5   Title  pt family will demo understanding of oral care regimen, including safe ingestion of water/ice chips over three sessions    Time  4    Period  Weeks    Status  On-going      SLP SHORT TERM GOAL #6   Title  pt will demo dysphagia HEP to improve management of secretions and safety with  any possible POs    Time  4    Period  Weeks    Status  On-going      SLP SHORT TERM GOAL #7   Title  family/caregivers will demo understanding of pt swallowing precautions, for pt safety with POs,  by verbal report     Time  4    Period  Weeks    Status  On-going       SLP Long Term Goals - 05/16/17 1814      SLP LONG TERM GOAL #1   Title  Pt will demo dysarthria  HEP to incr articulatory coordination for speech, with occasional mod A over three sessions    Time  8    Period  Weeks    Status  On-going      SLP LONG TERM GOAL #2   Title  pt will achieve phonation adequate for SLP intelligibility in imitated 3-syllable words/3-word phrases using intelligibility compenastions 15/20 trials    Time  8    Period  Weeks    Status  On-going      SLP LONG TERM GOAL #3   Title  Pt will imitate 2-syllable words or phrases with functional intelligibility for SLP in 8/15 trials over three sessions    Time  8    Period  Weeks    Status  On-going      SLP LONG TERM GOAL #4   Title  pt will sustain attention to therapy tasks for 10 minutes with multimodal cues in 10 sessions    Time  8    Period  Weeks    Status  On-going      SLP LONG TERM GOAL #5   Title  pt will demo dysphagia HEP to improve management of secretions and safety with possible POs    Time  8    Period  Weeks    Status  On-going       Plan - 05/16/17 1815    Clinical Impression Statement  Patient presents with ongoing deficits in speech intelligibility (dysarthria, reduced breath support), cognitive communication and swallowing. Pt intermittently somnolent throughout session. Husband offering pt water in response to hydrophonic vocal quality; SLP reminded family that water/ice chips are only recommended if pt has had thorough oral care. Pt achieves voicing today with usual mod A in 50% of attempts for 2 syllable words; articulation adequate 25% of the time. Recommend direct skilled ST to address pt's decline in  communication skills, and to monitor a HEP for dysphagia and dysarthria in order to maximize functional communication, secretion management/ swallowing safety, reduce caregiver burden and improve quality of life.    Speech Therapy Frequency  2x / week    Treatment/Interventions  Aspiration precaution training;Trials of upgraded texture/liquids;Compensatory strategies;Functional tasks;Patient/family education;Cueing hierarchy;Diet toleration management by SLP;Cognitive reorganization;Compensatory techniques;SLP instruction and feedback;Internal/external aids;Multimodal communcation approach;Pharyngeal strengthening exercises;Oral motor exercises    Potential to Achieve Goals  Fair    Potential Considerations  Ability to learn/carryover information;Co-morbidities;Severity of impairments;Cooperation/participation level;Previous level of function    Consulted and Agree with Plan of Care  Patient;Family member/caregiver    Family Member Consulted  Husband, [name]       Patient will benefit from skilled therapeutic intervention in order to improve the following deficits and impairments:   Dysarthria and anarthria  Dysphagia, oropharyngeal phase  Cognitive communication deficit    Problem List Patient Active Problem List   Diagnosis Date Noted  . Coagulase negative Staphylococcus bacteremia 02/09/2017  . Abdominal distension   . Chronic mucus hypersecretion, respiratory 12/31/2016  . Severe protein-calorie malnutrition [name]: less than 60% of standard weight) ([facility]) 12/31/2016  . Acute urinary retention 12/31/2016  . Constipation 12/31/2016  . Shortness of breath   . Respiratory distress   . Pneumonia  12/05/2016  . Acute respiratory failure ([place]) 12/05/2016  . Pseudomonas urinary tract infection 10/27/2016  . Severe muscle deconditioning 09/30/2016  . Pressure injury of skin 09/28/2016  . Respiratory failure ([place])   . Tracheobronchitis   . Urinary retention   . PEG (percutaneous  endoscopic gastrostomy) status ([facility])   . Benign essential HTN   . Hyponatremia   . Hypokalemia   . Reactive hypertension   . Respiratory insufficiency 09/24/2016  . Malignant neoplasm of upper-inner quadrant of left breast in female, estrogen receptor positive ([facility]) 07/14/2016  . Heterotopic ossification of bone 03/10/2016  . Acute on chronic respiratory failure with hypoxia ([place])   . Acute hypoxemic respiratory failure ([facility])   . Acute blood loss anemia   . Atelectasis 02/04/2016  . Debility 02/03/2016  . Pneumonia of both lower lobes due to Pseudomonas species ([facility])   . Pneumonia of both lower lobes due to methicillin susceptible Staphylococcus aureus (MSSA) ([facility])   . Acute respiratory failure with hypoxia ([facility])   . Acute encephalopathy   . Seizures ([place])   . Sepsis ([facility])   . Hypoxia   . Pain   . Chronic respiratory failure ([facility]) 12/28/2015  . Acute tracheobronchitis 12/24/2015  . Tracheostomy tube present ([facility])   . Tracheal stenosis   . Chronic respiratory failure with hypoxia ([facility])   . Spastic tetraplegia ([facility]) 10/20/2015  . Tracheostomy status ([facility]) 09/26/2015  . Allergic rhinitis 09/26/2015  . 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 Movement disorder 09/22/2015  . Encephalitis 09/22/2015  . Chest pain 02/07/2014  . Premature atrial contractions 01/13/2014  . Abnormality of gait 12/04/2013  . Hyperlipidemia 12/21/2011  . Cardiovascular risk factor 12/21/2011  . Bunion 01/31/2008  . Metatarsalgia of both feet 09/20/2007  . FLAT FOOT 09/20/2007  . HALLUX RIGIDUS, ACQUIRED 09/20/2007   Deneise Lever, [place], [place] Speech-Language Pathologist  Aliene Altes 05/16/2017, 6:19 PM  [facility] [address] [place], [place], [address] Phone: [phone]   Fax:  [phone]   Name: Dylin Ihnen MRN: [id number] Date of Birth: Oct 25, 1950

## 2017-05-16 NOTE — Therapy (Signed)
[facility] [address] [place], [place], [address] Phone: [phone]   Fax:  [phone]  Occupational Therapy Treatment  Patient Details  Name: Judy Spears MRN: [id number] Date of Birth: 07-11-1950 Referring Provider: Dr. Levada Schilling   Encounter Date: 05/16/2017  OT End of Session - 05/16/17 1443    Visit Number  2    Number of Visits  16    Date for OT Re-Evaluation  07/05/17    Authorization Type  medicare     Authorization Time Period  90 days    OT Start Time  1320    OT Stop Time  1400    OT Time Calculation (min)  40 min    Activity Tolerance  Patient tolerated treatment well    Behavior During Therapy  [facility] for tasks assessed/performed       Past Medical History:  Diagnosis Date  . Arthritis    lt great toe  . Complication of anesthesia    pt has had headaches post op-did advise to hydra well preop  . Hair loss    right-sided  . History of exercise stress test    a. ETT (12/15) with 12:00 exercise, no ST changes, occasional PACs.   . Hyperlipidemia   . Insomnia   . Migraine headache   . Movement disorder    resltess in left legs  . Postmenopausal   . 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 Premature atrial contractions    a. Holter (12/15) with 8% PACs, no atrial runs or atrial fibrillation.     Past Surgical History:  Procedure Laterality Date  . BREAST BIOPSY  01/01/2011   Procedure: BREAST BIOPSY WITH NEEDLE LOCALIZATION;  Surgeon: Rolm Bookbinder, MD;  Location: [facility];  Service: General;  Laterality: Left;  left breast wire localization biopsy  . BREAST BIOPSY Right 07/27/2012  . BREAST BIOPSY Right 07/07/2016  . BREAST BIOPSY Left 06/30/2016  . cataracts right eye    . CESAREAN SECTION  1986  . HERNIA REPAIR  2000   RIH  . INTRAMEDULLARY (IM) NAIL INTERTROCHANTERIC Left 01/30/2017   Procedure: ORIF AFFIXUS NAIL;  Surgeon: Paralee Cancel, MD;  Location: [facility];   Service: Orthopedics;  Laterality: Left;  . IR REPLC GASTRO/COLONIC TUBE PERCUT W/FLUORO  09/29/2016  . opirectinal membrane peel    . PEG PLACEMENT    . RHINOPLASTY  1983  . TRACHEOSTOMY    . TRACHEOSTOMY TUBE PLACEMENT N/A 12/15/2016   Procedure: TRACHEOSTOMY CHANGE;  Surgeon: [name] Montane, MD;  Location: [facility];  Service: ENT;  Laterality: N/A;    There were no vitals filed for this visit.  Subjective Assessment - 05/16/17 1438    Subjective   "Good" - patient indicates no pain    Patient is accompained by:  Family member    Pertinent History  see epic snapshot - ABI/encephalitis/hypoxia from virus; recent hospitalization due to MRSA PNA     Patient Stated Goals  Pt unable to state.     Currently in Pain?  No/denies    Pain Score  0-No pain                   OT Treatments/Exercises (OP) - 05/16/17 0001      Neurological Re-education Exercises   Other Exercises 1  Neuromuscular reedcation to address postural control and alignment in sitting and when transitioning to stand.  Patient with overactive head/neck/upper extremities with all weight shifts.  Patient with  limited hip flexion for sit to stand, and limited hip and knee extension, especially in left leg in standing.  Worked on postural alignment for standing to encourage greater thoracic, and hip extension, with more neutral alignment in trunk/rib cage, and weight through BLE's.                 OT Short Term Goals - 05/16/17 1445      OT SHORT TERM GOAL #1   Title  Pt will be mod a for sit to stand from toilet in steady to reduce care giver burden. - 06/07/2017    Status  New      OT SHORT TERM GOAL #2   Title  Pt will sit on level surface with 1 UE support only in preparation for participation in basic self care tasks for at least 3 minutes consistently    Status  New      OT SHORT TERM GOAL #3   Title  Pt will maintain static standing balance with BUE support during ADL activity  with mod a for at least  3 minutes consistently    Status  New      OT SHORT TERM GOAL #4   Title  Pt will roll during LB dressing with max a x1 to reduce caregiver burden.     Status  New      OT SHORT TERM GOAL #5   Title  Pt will demonstrate ability to follow simple, functional commands in context at least 50% of the time.     Status  On-going        OT Long Term Goals - 05/16/17 1445      OT LONG TERM GOAL #1   Title  Pt will go from sit to stand from toilet with steady with min a to reduce caregiver burden - 07/05/2017    Status  New      OT LONG TERM GOAL #2   Title  Pt will sit on level surfaces for participation in basic self care tasks with no UE support for at least 5 minutes    Status  New      OT LONG TERM GOAL #3   Title  Pt will maintain static standing balance with BUE support duirng ADL activity with min a for at least 3 minutes    Status  New      OT LONG TERM GOAL #4   Title  Pt wil roll during LB dressing with mod a x1 to reduce caregiver burden.      Status  On-going      OT LONG TERM GOAL #5   Title  Pt will be max a x1 for clothing mgmt using steady for toilet transfers    Status  On-going      OT LONG TERM GOAL #6   Title  Pt will demonstrate ability to don shirt over both arms with set up.     Status  On-going      OT LONG TERM GOAL #7   Title  Pt will demonstrate ability to sit with supevision in wheelchair shower seat during shower.     Status  On-going            Plan - 05/16/17 1446    OT Treatment/Interventions  Self-care/ADL training;Ultrasound;Moist Heat;Electrical Stimulation;DME and/or AE instruction;Neuromuscular education;Therapeutic exercise;Functional Mobility Training;Manual Therapy;Passive range of motion;Splinting;Therapeutic activities;Balance training;Patient/family education;Visual/perceptual remediation/compensation;Cognitive remediation/compensation    Plan  Review short and long term goals, review splints from prior  episodes, NMR trunk and LE's  for trnasitionsal mvmts    Consulted and Agree with Plan of Care  Patient;Family member/caregiver    Family Member Consulted  [name]       Patient will benefit from skilled therapeutic intervention in order to improve the following deficits and impairments:  Decreased endurance, Decreased skin integrity, Impaired vision/preception, Improper body mechanics, Impaired perceived functional ability, Decreased knowledge of precautions, Cardiopulmonary status limiting activity, Decreased activity tolerance, Decreased knowledge of use of DME, Decreased strength, Impaired flexibility, Improper spinal/pelvic alignment, Impaired sensation, Difficulty walking, Decreased mobility, Decreased balance, Decreased cognition, Decreased range of motion, Impaired tone, Impaired UE functional use, Decreased safety awareness, Decreased coordination, Pain, Abnormal gait  Visit Diagnosis: Muscle weakness (generalized)  Other symptoms and signs involving the nervous system  Abnormal posture  Unsteadiness on feet  Other lack of coordination  Other abnormal involuntary movements  Stiffness of left elbow, not elsewhere classified  Frontal lobe and executive function deficit  Spastic tetraplegia (HCC)    Problem List Patient Active Problem List   Diagnosis Date Noted  . Coagulase negative Staphylococcus bacteremia 02/09/2017  . Abdominal distension   . Chronic mucus hypersecretion, respiratory 12/31/2016  . Severe protein-calorie malnutrition [name]: less than 60% of standard weight) ([place]) 12/31/2016  . Acute urinary retention 12/31/2016  . Constipation 12/31/2016  . Shortness of breath   . Respiratory distress   . Pneumonia 12/05/2016  . Acute respiratory failure ([facility]) 12/05/2016  . Pseudomonas urinary tract infection 10/27/2016  . Severe muscle deconditioning 09/30/2016  . Pressure injury of skin 09/28/2016  . Respiratory failure ([place])   . Tracheobronchitis   . Urinary retention   . PEG  (percutaneous endoscopic gastrostomy) status ([place])   . Benign essential HTN   . Hyponatremia   . Hypokalemia   . Reactive hypertension   . Respiratory insufficiency 09/24/2016  . Malignant neoplasm of upper-inner quadrant of left breast in female, estrogen receptor positive ([place]) 07/14/2016  . Heterotopic ossification of bone 03/10/2016  . Acute on chronic respiratory failure with hypoxia ([place])   . Acute hypoxemic respiratory failure ([facility])   . Acute blood loss anemia   . Atelectasis 02/04/2016  . Debility 02/03/2016  . Pneumonia of both lower lobes due to Pseudomonas species ([place])   . Pneumonia of both lower lobes due to methicillin susceptible Staphylococcus aureus (MSSA) ([place])   . Acute respiratory failure with hypoxia ([place])   . Acute encephalopathy   . Seizures ([place])   . Sepsis ([place] [facility])   . Hypoxia   . Pain   . Chronic respiratory failure ([place]) 12/28/2015  . Acute tracheobronchitis 12/24/2015  . Tracheostomy tube present ([place])   . Tracheal stenosis   . Chronic respiratory failure with hypoxia ([place])   . Spastic tetraplegia ([place]) 10/20/2015  . Tracheostomy status ([place]) 09/26/2015  . Allergic rhinitis 09/26/2015  . 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 Movement disorder 09/22/2015  . Encephalitis 09/22/2015  . Chest pain 02/07/2014  . Premature atrial contractions 01/13/2014  . Abnormality of gait 12/04/2013  . Hyperlipidemia 12/21/2011  . Cardiovascular risk factor 12/21/2011  . Bunion 01/31/2008  . Metatarsalgia of both feet 09/20/2007  . FLAT FOOT 09/20/2007  . HALLUX RIGIDUS, ACQUIRED 09/20/2007    Mariah Milling, OTR/L 05/16/2017, 2:48 PM  [facility] [address] [place], [place], [address] Phone: [phone]   Fax:  [phone]  Name: Terriyah Westra MRN: [id number] Date of Birth: 08/06/1950

## 2017-05-17 ENCOUNTER — Encounter: Payer: Self-pay | Admitting: Occupational Therapy

## 2017-05-17 ENCOUNTER — Ambulatory Visit: Payer: Medicare Other | Admitting: Occupational Therapy

## 2017-05-17 ENCOUNTER — Ambulatory Visit: Payer: Medicare Other | Admitting: Rehabilitation

## 2017-05-17 DIAGNOSIS — R41844 Frontal lobe and executive function deficit: Secondary | ICD-10-CM

## 2017-05-17 DIAGNOSIS — M25611 Stiffness of right shoulder, not elsewhere classified: Secondary | ICD-10-CM

## 2017-05-17 DIAGNOSIS — M6281 Muscle weakness (generalized): Secondary | ICD-10-CM | POA: Diagnosis not present

## 2017-05-17 DIAGNOSIS — M25622 Stiffness of left elbow, not elsewhere classified: Secondary | ICD-10-CM

## 2017-05-17 DIAGNOSIS — G8250 Quadriplegia, unspecified: Secondary | ICD-10-CM

## 2017-05-17 DIAGNOSIS — R29818 Other symptoms and signs involving the nervous system: Secondary | ICD-10-CM

## 2017-05-17 DIAGNOSIS — M25612 Stiffness of left shoulder, not elsewhere classified: Secondary | ICD-10-CM

## 2017-05-17 DIAGNOSIS — R293 Abnormal posture: Secondary | ICD-10-CM

## 2017-05-17 DIAGNOSIS — R2681 Unsteadiness on feet: Secondary | ICD-10-CM

## 2017-05-17 NOTE — Therapy (Signed)
[facility] [address] [place], [place], [address] Phone: [phone]   Fax:  [phone]  Occupational Therapy Treatment  Patient Details  Name: Judy Spears MRN: [id number] Date of Birth: 1950/08/15 Referring Provider: Dr. Levada Schilling   Encounter Date: 05/17/2017  OT End of Session - 05/17/17 1705    Visit Number  3    Number of Visits  16    Date for OT Re-Evaluation  07/05/17    Authorization Type  medicare     Authorization Time Period  90 days    OT Start Time  1317    OT Stop Time  1400    OT Time Calculation (min)  43 min    Activity Tolerance  Patient limited by lethargy       Past Medical History:  Diagnosis Date  . Arthritis    lt great toe  . Complication of anesthesia    pt has had headaches post op-did advise to hydra well preop  . Hair loss    right-sided  . History of exercise stress test    a. ETT (12/15) with 12:00 exercise, no ST changes, occasional PACs.   . Hyperlipidemia   . Insomnia   . Migraine headache   . Movement disorder    resltess in left legs  . Postmenopausal   . 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 Premature atrial contractions    a. Holter (12/15) with 8% PACs, no atrial runs or atrial fibrillation.     Past Surgical History:  Procedure Laterality Date  . BREAST BIOPSY  01/01/2011   Procedure: BREAST BIOPSY WITH NEEDLE LOCALIZATION;  Surgeon: Rolm Bookbinder, MD;  Location: [facility];  Service: General;  Laterality: Left;  left breast wire localization biopsy  . BREAST BIOPSY Right 07/27/2012  . BREAST BIOPSY Right 07/07/2016  . BREAST BIOPSY Left 06/30/2016  . cataracts right eye    . CESAREAN SECTION  1986  . HERNIA REPAIR  2000   RIH  . INTRAMEDULLARY (IM) NAIL INTERTROCHANTERIC Left 01/30/2017   Procedure: ORIF AFFIXUS NAIL;  Surgeon: Paralee Cancel, MD;  Location: [facility];  Service: Orthopedics;  Laterality: Left;  . IR REPLC GASTRO/COLONIC  TUBE PERCUT W/FLUORO  09/29/2016  . opirectinal membrane peel    . PEG PLACEMENT    . RHINOPLASTY  1983  . TRACHEOSTOMY    . TRACHEOSTOMY TUBE PLACEMENT N/A 12/15/2016   Procedure: TRACHEOSTOMY CHANGE;  Surgeon: [name] Montane, MD;  Location: [facility];  Service: ENT;  Laterality: N/A;    There were no vitals filed for this visit.  Subjective Assessment - 05/17/17 1322    Subjective   Nothing (when asked what she was doing when trying to work on sit to stand"    Patient is accompained by:  -- caregiver [name]    Pertinent History  see epic snapshot - ABI/encephalitis/hypoxia from virus; recent hospitalization due to MRSA PNA     Patient Stated Goals  Pt unable to state.     Currently in Pain?  No/denies                   OT Treatments/Exercises (OP) - 05/17/17 0001      Neurological Re-education Exercises   Other Exercises 1  Neuro re ed to address sit to stand, static and dynamic standing balance, postural control and alignment in sitting and standing, simple stepping, and stand to sit. Also addressed encouraging pt to be actively involved in transfers -  pt attempted to assist approximately 25% of the time with transfers and 50% of the time with sit to stand/standing. Pt required max cues to attend to tasks today and follow simple instructions.  Pt required mod a for static sitting balance today EOM.                 OT Short Term Goals - 05/17/17 1658      OT SHORT TERM GOAL #1   Title  Pt will be mod a for sit to stand from toilet in steady to reduce care giver burden. - 06/07/2017    Status  On-going      OT SHORT TERM GOAL #2   Title  Pt will sit on level surface with 1 UE support only in preparation for participation in basic self care tasks for at least 3 minutes consistently    Status  On-going      OT SHORT TERM GOAL #3   Title  Pt will maintain static standing balance with BUE support during ADL activity  with mod a for at least 3 minutes consistently     Status  On-going      OT SHORT TERM GOAL #4   Title  Pt will roll during LB dressing with max a x1 to reduce caregiver burden.     Status  On-going      OT SHORT TERM GOAL #5   Title  Pt will demonstrate ability to follow simple, functional commands in context at least 50% of the time.     Status  On-going        OT Long Term Goals - 05/17/17 1659      OT LONG TERM GOAL #1   Title  Pt will go from sit to stand from toilet with steady with min a to reduce caregiver burden - 07/05/2017    Status  On-going      OT LONG TERM GOAL #2   Title  Pt will sit on level surfaces for participation in basic self care tasks with no UE support for at least 5 minutes    Status  On-going      OT LONG TERM GOAL #3   Title  Pt will maintain static standing balance with BUE support duirng ADL activity with min a for at least 3 minutes    Status  On-going      OT LONG TERM GOAL #4   Title  Pt wil roll during LB dressing with mod a x1 to reduce caregiver burden.      Status  On-going            Plan - 05/17/17 1700    Clinical Impression Statement  Pt with very slow progress toward goals. 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    Occupational performance deficits (Please refer to evaluation for details):  ADL's;IADL's;Rest and Sleep;Leisure;Social Participation;Work    Physicist, medical    Current Impairments/barriers affecting progress:  severity of deficits, very slow rate of recovery; pt with recent significant decline over past several months due medical complications    OT Frequency  2x / week    OT Duration  8 weeks  OT Treatment/Interventions  Self-care/ADL training;Ultrasound;Moist Heat;Electrical Stimulation;DME and/or AE instruction;Neuromuscular education;Therapeutic exercise;Functional Mobility Training;Manual Therapy;Passive range of motion;Splinting;Therapeutic activities;Balance training;Patient/family education;Visual/perceptual remediation/compensation;Cognitive remediation/compensation    Plan   review splints from prior episodes, NMR trunk and LE's for transitional movements, static standing, sitting balance    Consulted and Agree with Plan of Care  Patient;Family member/caregiver    Family Member [employer]       Patient will benefit from skilled therapeutic intervention in order to improve the following deficits and impairments:  Decreased endurance, Decreased skin integrity, Impaired vision/preception, Improper body mechanics, Impaired perceived functional ability, Decreased knowledge of precautions, Cardiopulmonary status limiting activity, Decreased activity tolerance, Decreased knowledge of use of DME, Decreased strength, Impaired flexibility, Improper spinal/pelvic alignment, Impaired sensation, Difficulty walking, Decreased mobility, Decreased balance, Decreased cognition, Decreased range of motion, Impaired tone, Impaired UE functional use, Decreased safety awareness, Decreased coordination, Pain, Abnormal gait  Visit Diagnosis: Muscle weakness (generalized)  Other symptoms and signs involving the nervous system  Abnormal posture  Unsteadiness on feet  Stiffness of left elbow, not elsewhere classified  Frontal lobe and executive function deficit  Spastic tetraplegia (HCC)  Stiffness of left shoulder, not elsewhere classified  Stiffness of right shoulder, not elsewhere classified    Problem List Patient Active Problem List   Diagnosis Date Noted  . Coagulase negative Staphylococcus bacteremia 02/09/2017  . Abdominal distension   . Chronic mucus hypersecretion,  respiratory 12/31/2016  . Severe protein-calorie malnutrition [name]: less than 60% of standard weight) ([place] [facility]) 12/31/2016  . Acute urinary retention 12/31/2016  . Constipation 12/31/2016  . Shortness of breath   . Respiratory distress   . Pneumonia 12/05/2016  . Acute respiratory failure ([facility]) 12/05/2016  . Pseudomonas urinary tract infection 10/27/2016  . Severe muscle deconditioning 09/30/2016  . Pressure injury of skin 09/28/2016  . Respiratory failure ([facility])   . Tracheobronchitis   . Urinary retention   . PEG (percutaneous endoscopic gastrostomy) status ([facility])   . Benign essential HTN   . Hyponatremia   . Hypokalemia   . Reactive hypertension   . Respiratory insufficiency 09/24/2016  . Malignant neoplasm of upper-inner quadrant of left breast in female, estrogen receptor positive ([facility]) 07/14/2016  . Heterotopic ossification of bone 03/10/2016  . Acute on chronic respiratory failure with hypoxia ([facility])   . Acute hypoxemic respiratory failure ([facility])   . Acute blood loss anemia   . Atelectasis 02/04/2016  . Debility 02/03/2016  . Pneumonia of both lower lobes due to Pseudomonas species ([facility])   . Pneumonia of both lower lobes due to methicillin susceptible Staphylococcus aureus (MSSA) ([facility])   . Acute respiratory failure with hypoxia ([facility])   . Acute encephalopathy   . Seizures ([facility])   . Sepsis ([facility])   . Hypoxia   . Pain   . Chronic respiratory failure ([facility]) 12/28/2015  . Acute tracheobronchitis 12/24/2015  . Tracheostomy tube present ([facility])   . Tracheal stenosis   . Chronic respiratory failure with hypoxia ([facility])   . Spastic tetraplegia ([facility]) 10/20/2015  . Tracheostomy status ([employer]) 09/26/2015  . Allergic rhinitis 09/26/2015  . 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 Movement disorder 09/22/2015  . Encephalitis 09/22/2015  . Chest pain 02/07/2014  . Premature atrial contractions 01/13/2014  . Abnormality of gait 12/04/2013  . Hyperlipidemia 12/21/2011  .  Cardiovascular risk factor 12/21/2011  . Bunion 01/31/2008  . Metatarsalgia of both feet 09/20/2007  . FLAT FOOT 09/20/2007  . HALLUX RIGIDUS, ACQUIRED 09/20/2007    Quay Burow,  OTR/L 05/17/2017, 5:08 PM  [facility] [address] [place], [place], [address] Phone: [phone]   Fax:  [phone]  Name: Anuoluwapo Mefferd MRN: [id number] Date of Birth: Jan 08, 1951

## 2017-05-20 ENCOUNTER — Ambulatory Visit: Payer: Medicare Other | Admitting: Rehabilitation

## 2017-05-20 ENCOUNTER — Encounter: Payer: Self-pay | Admitting: Rehabilitation

## 2017-05-20 ENCOUNTER — Ambulatory Visit: Payer: Medicare Other

## 2017-05-20 DIAGNOSIS — R4701 Aphasia: Secondary | ICD-10-CM

## 2017-05-20 DIAGNOSIS — R41841 Cognitive communication deficit: Secondary | ICD-10-CM

## 2017-05-20 DIAGNOSIS — R471 Dysarthria and anarthria: Secondary | ICD-10-CM

## 2017-05-20 DIAGNOSIS — R29818 Other symptoms and signs involving the nervous system: Secondary | ICD-10-CM

## 2017-05-20 DIAGNOSIS — R2681 Unsteadiness on feet: Secondary | ICD-10-CM

## 2017-05-20 DIAGNOSIS — M6281 Muscle weakness (generalized): Secondary | ICD-10-CM

## 2017-05-20 DIAGNOSIS — R1312 Dysphagia, oropharyngeal phase: Secondary | ICD-10-CM

## 2017-05-20 DIAGNOSIS — R293 Abnormal posture: Secondary | ICD-10-CM

## 2017-05-20 NOTE — Therapy (Signed)
[facility] [address] [place], [place], [address] Phone: [phone]   Fax:  [phone]  Physical Therapy Treatment  Patient Details  Name: Judy Spears MRN: [id number] Date of Birth: [birth year] Referring Provider: Dr. Levada Schilling   Encounter Date: 05/20/2017  PT End of Session - 05/20/17 1303    Visit Number  2    Number of Visits  17    Date for PT Re-Evaluation  07/09/17    Authorization Type  [employer] Medicare    Authorization Time Period  05-10-17 - 08-08-17    PT Start Time  1158    PT Stop Time  1245    PT Time Calculation (min)  47 min    Activity Tolerance  Patient limited by lethargy;Patient limited by pain       Past Medical History:  Diagnosis Date  . Arthritis    lt great toe  . Complication of anesthesia    pt has had headaches post op-did advise to hydra well preop  . Hair loss    right-sided  . History of exercise stress test    a. ETT (12/15) with 12:00 exercise, no ST changes, occasional PACs.   . Hyperlipidemia   . Insomnia   . Migraine headache   . Movement disorder    resltess in left legs  . Postmenopausal   . 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 Premature atrial contractions    a. Holter (12/15) with 8% PACs, no atrial runs or atrial fibrillation.     Past Surgical History:  Procedure Laterality Date  . BREAST BIOPSY  01/01/2011   Procedure: BREAST BIOPSY WITH NEEDLE LOCALIZATION;  Surgeon: Rolm Bookbinder, MD;  Location: [facility];  Service: General;  Laterality: Left;  left breast wire localization biopsy  . BREAST BIOPSY Right 07/27/2012  . BREAST BIOPSY Right 07/07/2016  . BREAST BIOPSY Left 06/30/2016  . cataracts right eye    . CESAREAN SECTION  1986  . HERNIA REPAIR  2000   RIH  . INTRAMEDULLARY (IM) NAIL INTERTROCHANTERIC Left 01/30/2017   Procedure: ORIF AFFIXUS NAIL;  Surgeon: Paralee Cancel, MD;  Location: [facility];  Service: Orthopedics;  Laterality:  Left;  . IR REPLC GASTRO/COLONIC TUBE PERCUT W/FLUORO  09/29/2016  . opirectinal membrane peel    . PEG PLACEMENT    . RHINOPLASTY  1983  . TRACHEOSTOMY    . TRACHEOSTOMY TUBE PLACEMENT N/A 12/15/2016   Procedure: TRACHEOSTOMY CHANGE;  Surgeon: [name] Montane, MD;  Location: [facility];  Service: ENT;  Laterality: N/A;    There were no vitals filed for this visit.  Subjective Assessment - 05/20/17 1302    Subjective  Pt nods "no" to changes or pain.  [name] reports that they have been getting onto therapy mat at home and performing sitting balance and going to R elbow.     Patient is accompained by:  Family member    Pertinent History  [place]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  No/denies              NMR:  Performed squat pivot transfer w/c<>mat during session at total A level.  Session focused on sitting balance with decreased UE support and UE "pulling", postural control in sitting and standing, standing weight shifts and transitional movements from partial sit to stand.  While seated  pt requires up to min A for sitting balance today with cues for L lateral weight shift and R lateral cervical flexion. She does well correcting head posture, however has difficulty shifting onto L LE in sitting on stedy and standing.  Provided passive stretch in standing with PT using leg at hips and arms at pts chest for more upright posture.  Performed lateral weight shifts with toe taps x 10 reps on each side.  Partial sit (on stedy) <>stand with UE support on [name] (caregiver) to decrease amount of "pulling" and increase LE activation.  Pt able to do during session at S level, however is unable to sustain any trunk extension and tends to lean on front bar on stedy.    FYI:  Attempted to don B AFOs during session, however they are too small for pt this time and will contact Gerald Stabs from [facility] to come to future  session to assess further.                      PT Short Term Goals - 05/11/17 2113      PT SHORT TERM GOAL #1   Title  Perform sit to/from stand from wheelchair or mat with +1 mod assist.    Baseline  max to total assist on 05-11-17    Time  4    Period  Weeks    Status  New    Target Date  06/09/17      PT SHORT TERM GOAL #2   Title  Pt will perform sit to stand from wheelchair to RW with max assist for pre-gait skill.    Baseline  max to total assist +2 hand held assist - 05-10-17    Time  4    Period  Weeks    Status  New    Target Date  06/09/17      PT SHORT TERM GOAL #3   Title  Pt will transfer supine to sit with max assist.    Baseline  total assist on 05-10-17    Time  4    Period  Weeks    Status  New    Target Date  06/09/17      PT SHORT TERM GOAL #4   Title  Pt will sit with RUE support with min to mod assist unsupported on side of mat for at least 3" to demo improved trunk strength and core stabilization.    Baseline  performance varies - pt able to sit with RUE support for approx. 1" with CGA to max assist with LOB     Time  4    Period  Weeks    Status  New    Target Date  06/09/17      PT SHORT TERM GOAL #5   Title  Pt will amb. 85' with appropriate assistive device with max assist +1    Baseline  total assist +2 -    05-10-17    Time  4    Period  Weeks    Status  New    Target Date  06/09/17        PT Long Term Goals - 05/11/17 2122      PT LONG TERM GOAL #1   Title  Pt will perform supine rolling R and L with min assist  to indicate improved independence with bed mobility.      Baseline  dependent for all bed mobility per spouse's report - 05-10-17  Time  8    Period  Weeks    Status  New    Target Date  07/09/17      PT LONG TERM GOAL #2   Title  Pt will sit on side of mat with RUE support for at least 5" with SBA     Baseline  max assist to min assist - ability varies    Time  8    Period  Weeks    Status  New     Target Date  07/09/17      PT LONG TERM GOAL #3   Title  Pt will amb. 20' with appropriate assistive device with +1 max assist.    Baseline  +2 max - total assist- pt unable to advance LE's - feet not clearing due to pt not wearing AFO's - 05-10-17    Time  8    Period  Weeks    Status  New    Target Date  07/09/17      PT LONG TERM GOAL #4   Title  Pt will perform basic transfers with min to mod assist to indicate improved functional independence.      Baseline  max assist - 05-10-17    Time  8    Period  Weeks    Target Date  07/09/17      PT LONG TERM GOAL #5   Title  Pt will perform static standing x 3" with bil. UE support with mod assist.      Time  8    Period  Weeks    Status  New    Target Date  07/09/17      Additional Long Term Goals   Additional Long Term Goals  Yes      PT LONG TERM GOAL #6   Title  Pt will be able to return to participation in therapeutic horseback riding at [facility] for ongoing exercise program.    Time  8    Period  Weeks    Status  New    Target Date  07/09/17            Plan - 05/20/17 1304    Clinical Impression Statement  Skilled session focused on sitting balance, postural control, transitional movements, and standing performing weight shifts.  Utilized stedy during session for UE support so that PT could assist with posture and weight shift.      Rehab Potential  Fair    Clinical Impairments Affecting Rehab Potential  severity of deficits with previous extensive thereapies    PT Frequency  2x / week    PT Duration  8 weeks    PT Treatment/Interventions  ADLs/Self Care Home Management;Therapeutic exercise;Therapeutic activities;Functional mobility training;Gait training;Balance training;Neuromuscular re-education;Patient/family education;Orthotic Fit/Training;Passive range of motion    PT Next Visit Plan  transfer training, sitting balance; can attempt supine if pt able to tolerate based on back pain; standing as tolerated;  Suzanne-AFOs no longer fit, will need to contact [facility] and have him come look at her now.      PT Home Exercise Plan  caregiver and husband have previously been instructed in ROM, sitting balance, and transfer training    Consulted and Agree with Plan of Care  Patient       Patient will benefit from skilled therapeutic intervention in order to improve the following deficits and impairments:  Abnormal gait, Decreased activity tolerance, Decreased balance, Decreased cognition, Decreased coordination, Decreased mobility, Decreased range of motion, Decreased endurance,  Hypomobility, Decreased strength, Impaired flexibility, Increased muscle spasms, Impaired UE functional use, Postural dysfunction, Pain  Visit Diagnosis: Muscle weakness (generalized)  Abnormal posture  Unsteadiness on feet  Other symptoms and signs involving the nervous system     Problem List Patient Active Problem List   Diagnosis Date Noted  . Coagulase negative Staphylococcus bacteremia 02/09/2017  . Abdominal distension   . Chronic mucus hypersecretion, respiratory 12/31/2016  . Severe protein-calorie malnutrition [name]: less than 60% of standard weight) ([place]) 12/31/2016  . Acute urinary retention 12/31/2016  . Constipation 12/31/2016  . Shortness of breath   . Respiratory distress   . Pneumonia 12/05/2016  . Acute respiratory failure ([facility]) 12/05/2016  . Pseudomonas urinary tract infection 10/27/2016  . Severe muscle deconditioning 09/30/2016  . Pressure injury of skin 09/28/2016  . Respiratory failure ([place])   . Tracheobronchitis   . Urinary retention   . PEG (percutaneous endoscopic gastrostomy) status ([place])   . Benign essential HTN   . Hyponatremia   . Hypokalemia   . Reactive hypertension   . Respiratory insufficiency 09/24/2016  . Malignant neoplasm of upper-inner quadrant of left breast in female, estrogen receptor positive ([place]) 07/14/2016  . Heterotopic ossification of bone 03/10/2016  . Acute  on chronic respiratory failure with hypoxia ([place])   . Acute hypoxemic respiratory failure ([facility])   . Acute blood loss anemia   . Atelectasis 02/04/2016  . Debility 02/03/2016  . Pneumonia of both lower lobes due to Pseudomonas species ([place])   . Pneumonia of both lower lobes due to methicillin susceptible Staphylococcus aureus (MSSA) ([place])   . Acute respiratory failure with hypoxia ([place] [place])   . Acute encephalopathy   . Seizures ([place])   . Sepsis ([facility])   . Hypoxia   . Pain   . Chronic respiratory failure ([place]) 12/28/2015  . Acute tracheobronchitis 12/24/2015  . Tracheostomy tube present ([place] [facility])   . Tracheal stenosis   . Chronic respiratory failure with hypoxia ([place])   . Spastic tetraplegia ([facility]) 10/20/2015  . Tracheostomy status ([place]) 09/26/2015  . Allergic rhinitis 09/26/2015  . 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 Movement disorder 09/22/2015  . Encephalitis 09/22/2015  . Chest pain 02/07/2014  . Premature atrial contractions 01/13/2014  . Abnormality of gait 12/04/2013  . Hyperlipidemia 12/21/2011  . Cardiovascular risk factor 12/21/2011  . Bunion 01/31/2008  . Metatarsalgia of both feet 09/20/2007  . FLAT FOOT 09/20/2007  . HALLUX RIGIDUS, ACQUIRED 09/20/2007    Cameron Sprang, PT, MPT [facility] [address] [place], [place], [address] Phone: [phone]   Fax:  [phone] 05/20/17, 1:11 PM  Name: Judy Spears MRN: [id number] Date of Birth: [birth year]

## 2017-05-20 NOTE — Patient Instructions (Signed)
Bring a Clinical research associate book with family in it to work on Contractor of speech  Bring the list of 10 phrases/sentences said everyday.

## 2017-05-20 NOTE — Therapy (Signed)
[facility] [address] [place], [place], [address] Phone: [phone]   Fax:  [phone]  Speech Language Pathology Treatment  Patient Details  Name: Judy Spears MRN: [id number] Date of Birth: Jul 04, 1950 Referring Provider: Levada Schilling, MD   Encounter Date: 05/20/2017  End of Session - 05/20/17 1702    Visit Number  3    Number of Visits  17    Date for SLP Re-Evaluation  07/22/17    SLP Start Time  1449    SLP Stop Time   1531    SLP Time Calculation (min)  42 min    Activity Tolerance  Patient tolerated treatment well       Past Medical History:  Diagnosis Date  . Arthritis    lt great toe  . Complication of anesthesia    pt has had headaches post op-did advise to hydra well preop  . Hair loss    right-sided  . History of exercise stress test    a. ETT (12/15) with 12:00 exercise, no ST changes, occasional PACs.   . Hyperlipidemia   . Insomnia   . Migraine headache   . Movement disorder    resltess in left legs  . Postmenopausal   . 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 Premature atrial contractions    a. Holter (12/15) with 8% PACs, no atrial runs or atrial fibrillation.     Past Surgical History:  Procedure Laterality Date  . BREAST BIOPSY  01/01/2011   Procedure: BREAST BIOPSY WITH NEEDLE LOCALIZATION;  Surgeon: Rolm Bookbinder, MD;  Location: [facility];  Service: General;  Laterality: Left;  left breast wire localization biopsy  . BREAST BIOPSY Right 07/27/2012  . BREAST BIOPSY Right 07/07/2016  . BREAST BIOPSY Left 06/30/2016  . cataracts right eye    . CESAREAN SECTION  1986  . HERNIA REPAIR  2000   RIH  . INTRAMEDULLARY (IM) NAIL INTERTROCHANTERIC Left 01/30/2017   Procedure: ORIF AFFIXUS NAIL;  Surgeon: Paralee Cancel, MD;  Location: [facility];  Service: Orthopedics;  Laterality: Left;  . IR REPLC GASTRO/COLONIC TUBE PERCUT W/FLUORO  09/29/2016  . opirectinal membrane  peel    . PEG PLACEMENT    . RHINOPLASTY  1983  . TRACHEOSTOMY    . TRACHEOSTOMY TUBE PLACEMENT N/A 12/15/2016   Procedure: TRACHEOSTOMY CHANGE;  Surgeon: [name] Montane, MD;  Location: [facility];  Service: ENT;  Laterality: N/A;    There were no vitals filed for this visit.  Subjective Assessment - 05/20/17 1450    Subjective  Pt's caregiver [name] explained to SLP speech and language tasks pt completed at home yesterday.    Patient is accompained by:  -- sister in law Maudie Mercury,, caregiver [name]    Currently in Pain?  No/denies            ADULT SLP TREATMENT - 05/20/17 1457      General Information   Behavior/Cognition  Requires cueing;Pleasant mood;Cooperative;Alert    Patient Positioning  Upright in chair      Treatment Provided   Treatment provided  Cognitive-Linquistic      Cognitive-Linquistic Treatment   Treatment focused on  Dysarthria;Patient/family/caregiver education    Skilled Treatment  Hydrophonic voice as pt enters ST room today with [name] and Maudie Mercury (sister in law). Did ont clear with weak throat clear and reswallow x2. [name] went out of room to get water in nosey cup. When she returned SLP asked if pt had oral care  in last 30 minutes - pt had not. SLP performed oral care prior to pt receiving sips water, extracting approx 1/2 teaspoon yellow phlegm, twice, from posterior of pt's oral cavity. One included a scrap of food. SLP indicated that at home pt caregivers would suction orally at this point.  SLP reminded caregiver [name] and sister in law reasons why oral care is important. Hydrophonic voice returned once and improved but not cleared with swallow x2. With imitation, written, and demo cues (loudness and overarticulation) pt produced family names with voicing 90% of the time. Intelligibility with 1-syllable names 2/4, 2-syllable names 0/2, 3-syllable names 0/2. On second attempt with same cueing level pt's intelligibility improved to 1/2 with 2-syllable names, and the same with 1 and  3-syllable words. Please note one of two syllable names was ID'd by [name] (her name was the one produced). With writen cues for name, verbal/demo cues for loud speech ("NOW SHOUT!") and overarticulation, and nonverbal cues for full breath, SLP told pt family to make photo album with names of family to work with pt loudness. [name] stated she had made list of 10 phrases pt may say during the day and SLP encouraged her to bring this. SLP explained rationale for copmleting this list of 10 phrases once - twice each day. Spontaneous speech of 1-5 syllable responses understood by SLP on first attempt 14%  (1/7) and on second attempt did not improve.       Assessment / Recommendations / Plan   Plan  Continue with current plan of care      Progression Toward Goals   Progression toward goals  Progressing toward goals       SLP Education - 05/20/17 1701    Education provided  Yes    Education Details  Oral care necessary <30 minutes prior to giving pt water, rationale for this, benefit of album with pictures, rationale for 10 phrases/sentences    Person(s) Educated  Patient;Caregiver(s)    Methods  Explanation    Comprehension  Verbalized understanding       SLP Short Term Goals - 05/20/17 1707      SLP SHORT TERM GOAL #1   Title  Pt will demonstrate dysarthria HEP to increase articulatory coordination for speech with usual mod A over 3 sessions    Time  4    Period  Weeks    Status  On-going      SLP SHORT TERM GOAL #2   Title  Pt will achieve phonation adequate for SLP intelligibility in imitated two-syllable words using intelligibility compensations 12/20 trials with usual min A in 3 sessions    Time  4    Period  Weeks    Status  On-going      SLP SHORT TERM GOAL #3   Title  Pt will imitate 2-syllable words or phrases with functional intelligibility for SLP in 5/15 trials over 3 sessions     Time  4    Period  Weeks    Status  On-going      SLP SHORT TERM GOAL #4   Title  Pt will  sustain attention to therapy tasks for 10 minutes with multimodal cues in 5 sessions    Time  4    Period  Weeks    Status  On-going      SLP SHORT TERM GOAL #5   Title  pt family will demo understanding of oral care regimen, including safe ingestion of water/ice chips over three sessions  Time  4    Period  Weeks    Status  On-going      SLP SHORT TERM GOAL #6   Title  pt will demo dysphagia HEP to improve management of secretions and safety with any possible POs    Time  4    Period  Weeks    Status  On-going      SLP SHORT TERM GOAL #7   Title  family/caregivers will demo understanding of pt swallowing precautions, for pt safety with POs, by verbal report     Time  4    Period  Weeks    Status  On-going       SLP Long Term Goals - 05/20/17 1707      SLP LONG TERM GOAL #1   Title  Pt will demo dysarthria  HEP to incr articulatory coordination for speech, with occasional mod A over three sessions    Time  8    Period  Weeks    Status  On-going      SLP LONG TERM GOAL #2   Title  pt will achieve phonation adequate for SLP intelligibility in imitated 3-syllable words/3-word phrases using intelligibility compenastions 15/20 trials    Time  8    Period  Weeks    Status  On-going      SLP LONG TERM GOAL #3   Title  Pt will imitate 2-syllable words or phrases with functional intelligibility for SLP in 8/15 trials over three sessions    Time  8    Period  Weeks    Status  On-going      SLP LONG TERM GOAL #4   Title  pt will sustain attention to therapy tasks for 10 minutes with multimodal cues in 10 sessions    Time  8    Period  Weeks    Status  On-going      SLP LONG TERM GOAL #5   Title  pt will demo dysphagia HEP to improve management of secretions and safety with possible POs    Time  8    Period  Weeks    Status  On-going       Plan - 05/20/17 1702    Clinical Impression Statement  Patient presents with ongoing deficits in speech intelligibility  (dysarthria, reduced breath support), cognitive communication and swallowing. Pt appears to have been more alert and awake today than in previous session tihs week. SLP reminded caregivers that water only recommended if pt has had thorough oral care. See skilled intervention for details of voicing, fucntional articulation leading to speech intelligilibty with 1-3 syllable family names. Recommend direct skilled ST to address pt's decline in communication skills, and to monitor a HEP for dysphagia and dysarthria in order to maximize functional communication, secretion management/ swallowing safety, reduce caregiver burden and improve quality of life.    Speech Therapy Frequency  2x / week    Treatment/Interventions  Aspiration precaution training;Trials of upgraded texture/liquids;Compensatory strategies;Functional tasks;Patient/family education;Cueing hierarchy;Diet toleration management by SLP;Cognitive reorganization;Compensatory techniques;SLP instruction and feedback;Internal/external aids;Multimodal communcation approach;Pharyngeal strengthening exercises;Oral motor exercises    Potential to Achieve Goals  Fair    Potential Considerations  Ability to learn/carryover information;Co-morbidities;Severity of impairments;Cooperation/participation level;Previous level of function    Consulted and Agree with Plan of Care  Patient;Family member/caregiver    Family Member Consulted  Husband, [name]       Patient will benefit from skilled therapeutic intervention in order to improve the following deficits and  impairments:   Dysarthria and anarthria  Dysphagia, oropharyngeal phase  Cognitive communication deficit  Aphasia    Problem List Patient Active Problem List   Diagnosis Date Noted  . Coagulase negative Staphylococcus bacteremia 02/09/2017  . Abdominal distension   . Chronic mucus hypersecretion, respiratory 12/31/2016  . Severe protein-calorie malnutrition [name]: less than 60% of standard  weight) ([place]) 12/31/2016  . Acute urinary retention 12/31/2016  . Constipation 12/31/2016  . Shortness of breath   . Respiratory distress   . Pneumonia 12/05/2016  . Acute respiratory failure ([place]) 12/05/2016  . Pseudomonas urinary tract infection 10/27/2016  . Severe muscle deconditioning 09/30/2016  . Pressure injury of skin 09/28/2016  . Respiratory failure ([place])   . Tracheobronchitis   . Urinary retention   . PEG (percutaneous endoscopic gastrostomy) status ([place])   . Benign essential HTN   . Hyponatremia   . Hypokalemia   . Reactive hypertension   . Respiratory insufficiency 09/24/2016  . Malignant neoplasm of upper-inner quadrant of left breast in female, estrogen receptor positive ([place]) 07/14/2016  . Heterotopic ossification of bone 03/10/2016  . Acute on chronic respiratory failure with hypoxia ([place])   . Acute hypoxemic respiratory failure ([place])   . Acute blood loss anemia   . Atelectasis 02/04/2016  . Debility 02/03/2016  . Pneumonia of both lower lobes due to Pseudomonas species ([place])   . Pneumonia of both lower lobes due to methicillin susceptible Staphylococcus aureus (MSSA) ([place])   . Acute respiratory failure with hypoxia ([place])   . Acute encephalopathy   . Seizures ([place])   . Sepsis ([place])   . Hypoxia   . Pain   . Chronic respiratory failure ([place]) 12/28/2015  . Acute tracheobronchitis 12/24/2015  . Tracheostomy tube present ([place])   . Tracheal stenosis   . Chronic respiratory failure with hypoxia ([place])   . Spastic tetraplegia ([place]) 10/20/2015  . Tracheostomy status ([place]) 09/26/2015  . Allergic rhinitis 09/26/2015  . 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 Movement disorder 09/22/2015  . Encephalitis 09/22/2015  . Chest pain 02/07/2014  . Premature atrial contractions 01/13/2014  . Abnormality of gait 12/04/2013  . Hyperlipidemia 12/21/2011  . Cardiovascular risk factor 12/21/2011  . Bunion 01/31/2008  . Metatarsalgia of both feet 09/20/2007  .  FLAT FOOT 09/20/2007  . HALLUX RIGIDUS, ACQUIRED 09/20/2007    [facility] ,MS, CCC-SLP  05/20/2017, 5:10 PM  [facility] [address] [place], [place], [address] Phone: [phone]   Fax:  [phone]   Name: Judy Spears MRN: [id number] Date of Birth: 1950/05/21

## 2017-05-23 ENCOUNTER — Ambulatory Visit: Payer: Medicare Other

## 2017-05-23 DIAGNOSIS — M6281 Muscle weakness (generalized): Secondary | ICD-10-CM | POA: Diagnosis not present

## 2017-05-23 DIAGNOSIS — R471 Dysarthria and anarthria: Secondary | ICD-10-CM

## 2017-05-23 DIAGNOSIS — R4701 Aphasia: Secondary | ICD-10-CM

## 2017-05-23 DIAGNOSIS — R41841 Cognitive communication deficit: Secondary | ICD-10-CM

## 2017-05-23 DIAGNOSIS — R1312 Dysphagia, oropharyngeal phase: Secondary | ICD-10-CM

## 2017-05-23 NOTE — Therapy (Signed)
[facility] [address] [place], [place], [address] Phone: [phone]   Fax:  [phone]  Speech Language Pathology Treatment  Patient Details  Name: Judy Spears MRN: [id number] Date of Birth: Sep 12, 1950 Referring Provider: Levada Schilling, MD   Encounter Date: 05/23/2017  End of Session - 05/23/17 1657    Visit Number  4    Number of Visits  17    Date for SLP Re-Evaluation  07/22/17    SLP Start Time  1545 15 minutes late    SLP Stop Time   1617    SLP Time Calculation (min)  32 min    Activity Tolerance  Treatment limited secondary to agitation lability - first 7 minutes       Past Medical History:  Diagnosis Date  . Arthritis    lt great toe  . Complication of anesthesia    pt has had headaches post op-did advise to hydra well preop  . Hair loss    right-sided  . History of exercise stress test    a. ETT (12/15) with 12:00 exercise, no ST changes, occasional PACs.   . Hyperlipidemia   . Insomnia   . Migraine headache   . Movement disorder    resltess in left legs  . Postmenopausal   . 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 Premature atrial contractions    a. Holter (12/15) with 8% PACs, no atrial runs or atrial fibrillation.     Past Surgical History:  Procedure Laterality Date  . BREAST BIOPSY  01/01/2011   Procedure: BREAST BIOPSY WITH NEEDLE LOCALIZATION;  Surgeon: Rolm Bookbinder, MD;  Location: [facility];  Service: General;  Laterality: Left;  left breast wire localization biopsy  . BREAST BIOPSY Right 07/27/2012  . BREAST BIOPSY Right 07/07/2016  . BREAST BIOPSY Left 06/30/2016  . cataracts right eye    . CESAREAN SECTION  1986  . HERNIA REPAIR  2000   RIH  . INTRAMEDULLARY (IM) NAIL INTERTROCHANTERIC Left 01/30/2017   Procedure: ORIF AFFIXUS NAIL;  Surgeon: Paralee Cancel, MD;  Location: [facility];  Service: Orthopedics;  Laterality: Left;  . IR REPLC GASTRO/COLONIC TUBE  PERCUT W/FLUORO  09/29/2016  . opirectinal membrane peel    . PEG PLACEMENT    . RHINOPLASTY  1983  . TRACHEOSTOMY    . TRACHEOSTOMY TUBE PLACEMENT N/A 12/15/2016   Procedure: TRACHEOSTOMY CHANGE;  Surgeon: [name] Montane, MD;  Location: [facility];  Service: ENT;  Laterality: N/A;    There were no vitals filed for this visit.  Subjective Assessment - 05/23/17 1625    Subjective  Pt scheduled for 1530 appt at 1450 today.    Patient is accompained by:  -- [name] and [name]    Currently in Pain?  -- denies pain but pt very labile today in first 7 minutes of session            ADULT SLP TREATMENT - 05/23/17 1626      General Information   Behavior/Cognition  Alert;Cooperative;Agitated labile 1st 7 minutes- crying after each verbalization      Treatment Provided   Treatment provided  Cognitive-Linquistic      Cognitive-Linquistic Treatment   Treatment focused on  Dysarthria    Skilled Treatment  Pt brought 10 sentences with her - some were 6-7 syllables. SLP pared down to 4 words at most for practice today. Pt arrived with lability - crying between productions. 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 SLP educated pt, caregiver, and husband on what vocal strain/strangled quality sounds like (used pt as real-time example), and taught them strategies to relax voice (vocal pitch extension exercise, yawn-sigh, pitch low-hi-low, etc). In the last 3 minutes SLP went back to grandchildren's names with vocal strain/strangled quality emerging again. SLP told pt, husband, and caregiver to focus on vocally relaxing sounds on the way home, then try some target words/phrases and if pt remains with relaxed voice work on loudness. Do not work on loudness if strained/strangled voice is present. Pt with  audible/functional voice on 60% of 3-syllable imitated utternaces/words, with 50% functional intelligibility. Imitated 4-syllable phrases 1/2 functional voicing, 1/2 functional intelligibility. SPONTANEOUS: 3-syllable voicing 3/5, functional intelligibility 2/5. In 4+ syllables 0/3 voicing, 0/3 functional intelligibility. Pt's spontaneous 3-4 syllable utterances were understood by caregiver 5/8 (2/8 by SLP).      Assessment / Recommendations / Plan   Plan  Continue with current plan of care      Progression Toward Goals   Progression toward goals  Not progressing toward goals (comment) progress difficult due to strained/strangled voice       SLP Education - 05/23/17 1655    Education provided  Yes    Education Details  vocal relaxation techniques, rationale for this (?apraxia), see note and "pt instructions" for more details    Person(s) Educated  Patient;Spouse;Caregiver(s)    Methods  Explanation;Demonstration;Verbal cues    Comprehension  Verbalized understanding;Need further instruction;Returned demonstration;Verbal cues required       SLP Short Term Goals - 05/23/17 1700      SLP SHORT TERM GOAL #1   Title  Pt will demonstrate dysarthria HEP to increase articulatory coordination for speech with usual mod A over 3 sessions    Time  3    Period  Weeks    Status  On-going      SLP SHORT TERM GOAL #2   Title  Pt will achieve phonation adequate for SLP intelligibility in imitated two-syllable words using intelligibility compensations 12/20 trials with usual min A in 3 sessions    Time  3    Period  Weeks    Status  On-going      SLP SHORT TERM GOAL #3   Title  Pt will imitate 2-syllable words or phrases with functional intelligibility for SLP in 5/15 trials over 3 sessions     Time  3    Period  Weeks    Status  On-going      SLP SHORT TERM GOAL #4   Title  Pt will sustain attention to therapy tasks for 10 minutes with multimodal cues in 5 sessions    Baseline  05-23-17     Time  3    Period  Weeks    Status  On-going      SLP SHORT TERM GOAL #5   Title  pt family will demo understanding of oral care regimen, including safe ingestion of water/ice chips over three sessions    Time  3    Period  Weeks    Status  On-going      SLP SHORT TERM GOAL #6   Title  pt will demo dysphagia HEP to improve management of secretions and safety with any possible POs    Time  3    Period  Weeks  Status  On-going      SLP SHORT TERM GOAL #7   Title  family/caregivers will demo understanding of pt swallowing precautions, for pt safety with POs, by verbal report     Time  3    Period  Weeks    Status  On-going       SLP Long Term Goals - 05/23/17 1700      SLP LONG TERM GOAL #1   Title  Pt will demo dysarthria  HEP to incr articulatory coordination for speech, with occasional mod A over three sessions    Time  7    Period  Weeks    Status  On-going      SLP LONG TERM GOAL #2   Title  pt will achieve phonation adequate for SLP intelligibility in imitated 3-syllable words/3-word phrases using intelligibility compenastions 15/20 trials    Time  7    Period  Weeks    Status  On-going      SLP LONG TERM GOAL #3   Title  Pt will imitate 2-syllable words or phrases with functional intelligibility for SLP in 8/15 trials over three sessions    Time  7    Period  Weeks    Status  On-going      SLP LONG TERM GOAL #4   Title  pt will sustain attention to therapy tasks for 10 minutes with multimodal cues in 10 sessions    Time  7    Period  Weeks    Status  On-going      SLP LONG TERM GOAL #5   Title  pt will demo dysphagia HEP to improve management of secretions and safety with possible POs    Time  7    Period  Weeks    Status  On-going       Plan - 05/23/17 1658    Clinical Impression Statement  Patient with persistent deficits in speech intelligibility (dysarthria, reduced breath support), cognitive communication and swallowing. Pt session limited by  lability and by vocal apraxia/strained and strangled voicing today - approx 1/3 of session spent on vocal relaxation and family/caregiver education for vocal relaxation. See skilled intervention for further details. Recommend direct skilled ST to address pt's decline in communication skills, and to monitor a HEP for dysphagia and dysarthria in order to maximize functional communication, secretion management/ swallowing safety, reduce caregiver burden and improve quality of life.    Speech Therapy Frequency  2x / week    Treatment/Interventions  Aspiration precaution training;Trials of upgraded texture/liquids;Compensatory strategies;Functional tasks;Patient/family education;Cueing hierarchy;Diet toleration management by SLP;Cognitive reorganization;Compensatory techniques;SLP instruction and feedback;Internal/external aids;Multimodal communcation approach;Pharyngeal strengthening exercises;Oral motor exercises    Potential to Achieve Goals  Fair    Potential Considerations  Ability to learn/carryover information;Co-morbidities;Severity of impairments;Cooperation/participation level;Previous level of function    Consulted and Agree with Plan of Care  Patient;Family member/caregiver    Family Member Consulted  Husband, [name]       Patient will benefit from skilled therapeutic intervention in order to improve the following deficits and impairments:   Dysarthria and anarthria  Dysphagia, oropharyngeal phase  Cognitive communication deficit  Aphasia    Problem List Patient Active Problem List   Diagnosis Date Noted  . Coagulase negative Staphylococcus bacteremia 02/09/2017  . Abdominal distension   . Chronic mucus hypersecretion, respiratory 12/31/2016  . Severe protein-calorie malnutrition [name]: less than 60% of standard weight) ([place]) 12/31/2016  . Acute urinary retention 12/31/2016  . Constipation 12/31/2016  .  Shortness of breath   . Respiratory distress   . Pneumonia 12/05/2016  .  Acute respiratory failure ([place]) 12/05/2016  . Pseudomonas urinary tract infection 10/27/2016  . Severe muscle deconditioning 09/30/2016  . Pressure injury of skin 09/28/2016  . Respiratory failure ([place])   . Tracheobronchitis   . Urinary retention   . PEG (percutaneous endoscopic gastrostomy) status ([place])   . Benign essential HTN   . Hyponatremia   . Hypokalemia   . Reactive hypertension   . Respiratory insufficiency 09/24/2016  . Malignant neoplasm of upper-inner quadrant of left breast in female, estrogen receptor positive ([place]) 07/14/2016  . Heterotopic ossification of bone 03/10/2016  . Acute on chronic respiratory failure with hypoxia ([place])   . Acute hypoxemic respiratory failure ([place])   . Acute blood loss anemia   . Atelectasis 02/04/2016  . Debility 02/03/2016  . Pneumonia of both lower lobes due to Pseudomonas species ([place])   . Pneumonia of both lower lobes due to methicillin susceptible Staphylococcus aureus (MSSA) ([place])   . Acute respiratory failure with hypoxia ([place])   . Acute encephalopathy   . Seizures ([place])   . Sepsis ([place])   . Hypoxia   . Pain   . Chronic respiratory failure ([place]) 12/28/2015  . Acute tracheobronchitis 12/24/2015  . Tracheostomy tube present ([place])   . Tracheal stenosis   . Chronic respiratory failure with hypoxia ([place])   . Spastic tetraplegia ([place]) 10/20/2015  . Tracheostomy status ([place]) 09/26/2015  . Allergic rhinitis 09/26/2015  . 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 Movement disorder 09/22/2015  . Encephalitis 09/22/2015  . Chest pain 02/07/2014  . Premature atrial contractions 01/13/2014  . Abnormality of gait 12/04/2013  . Hyperlipidemia 12/21/2011  . Cardiovascular risk factor 12/21/2011  . Bunion 01/31/2008  . Metatarsalgia of both feet 09/20/2007  . FLAT FOOT 09/20/2007  . HALLUX RIGIDUS, ACQUIRED 09/20/2007    [facility] ,[place], CCC-SLP  05/23/2017, 5:01 PM  [facility] [address] [place], [place], [address] Phone: [phone]   Fax:  [phone]   Name: Judy Spears MRN: [id number] Date of Birth: [birth year]

## 2017-05-23 NOTE — Patient Instructions (Signed)
Try vocal relaxation on the way home and if Judy Spears gets strangled/strained voice with attempts at talking when you are home don't do any more practice tonight. If her voice gets like it was today, at home, do some of the same things we did today ("oo", "oh", "m", "n" sounds and combinations)

## 2017-05-25 ENCOUNTER — Ambulatory Visit: Payer: Medicare Other | Admitting: Rehabilitation

## 2017-05-25 ENCOUNTER — Encounter: Payer: Self-pay | Admitting: Adult Health

## 2017-05-25 ENCOUNTER — Ambulatory Visit: Payer: Medicare Other | Admitting: Adult Health

## 2017-05-25 ENCOUNTER — Encounter: Payer: Medicare Other | Admitting: Occupational Therapy

## 2017-05-25 DIAGNOSIS — J209 Acute bronchitis, unspecified: Secondary | ICD-10-CM

## 2017-05-25 DIAGNOSIS — J9611 Chronic respiratory failure with hypoxia: Secondary | ICD-10-CM | POA: Diagnosis not present

## 2017-05-25 MED ORDER — AMOXICILLIN-POT CLAVULANATE 600-42.9 MG/5ML PO SUSR: ORAL | 0 refills | Status: DC

## 2017-05-25 NOTE — Patient Instructions (Addendum)
Agumentin 875mg  Twice daily  For 7 days via peg.  Continue with Trach care .  Continue with VEST therapy .  Continue with trach collar at night .  Follow up with Dr. Lake Bells as planned and As needed   Please contact office for sooner follow up if symptoms do not improve or worsen or seek emergency care

## 2017-05-25 NOTE — Progress Notes (Signed)
@Patient  ID: Judy Spears, female    DOB: 1950-09-02, 67 y.o.   MRN: [id number]  Chief Complaint  Patient presents with  . 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 Specifically she was found unresponsive by her husband while [place] in [place] of [place]. She was brought to local hospital where she required intubation due to unresponsiveness. Tracheostomy was performed on hospital day 2 and extensive workup for her neurologic illness begun. 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 She was then transferred for inpatient rehabilitation to the [facility] in [place]. While there she continued to participate in physical therapy and occupational therapy. However, during this time she was never able to consistently take by mouth and all nutrition has been provided the enteral feeding since around the time of her admission to the intensive care unit.  The tracheostomy has been in place since her original hospitalization in December 2016. It was briefly removed in May of 2017 but unfortunately she had a complication of laryngospasm and it sounds like the tracheostomy was replaced approximately 2 weeks later.  The tracheostomy was removed on 11/12/2015.  Admitted 12/28/15 w/ resp distress w/ multilobar PNA requiring intubation and re-trach on 12/3 w/ #6 trach.  Decannulated x2 in past. And had to be re-trached due to resp distress.  concerned that if they decannulate now it will not last .ENT consult during hospitalization by Dr. Constance Holster Recommended  that she will need a 3 layer closure under anesthesia when ready to have trach out.   Admitted April 2018 for acute respiratory failure secondary to have care associated pneumonia. She was treated with antibiotics.  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  She is accompanied by her husband and caregiver. They have noticed over the last 4 days increased congestion with thick yellow to green mucus.  She has had much more rattling of mucus in her chest.  Patient is using the vest machine during the daytime.  Most days trach is capped with nighttime trach collar with humidified air.  Over the last 2 days they have left her trach uncapped during the daytime.  Did notice this morning that O2 saturations were 88 to 89%.  On arrival to the office today O2 saturations were 94%. Patient had no change in behavior or alertness. Continues with PEG feedings.  No issues with vomiting or high residuals Patient has been undergoing rehab and has been says she is making slow progress. No fevers.  Last admission in Jan with sputum cx -normal flora .    Allergies  Allergen Reactions  . Cefepime Other (See Comments)    Status epilepticus- this reaction is questionable per husband.States that it was determined that pt did not have seizure activity. Status epilepticus  . Tetracyclines & Related Photosensitivity and Other (  See Comments)    Blisters (childhood allergy)  . Doxycycline Monohydrate Other (See Comments)    Blisters, photosensitivity- family state that the reaction is actually to tetracycline.  . Pollen Extract Other (See Comments)    Running nose (seasonal allergies)    Immunization History  Administered  Date(s) Administered  . Influenza Split 10/26/2015, 11/08/2016  . Pneumococcal Conjugate-13 11/24/2016  . Pneumococcal Polysaccharide-23 11/09/2015    Past Medical History:  Diagnosis Date  . Arthritis    lt great toe  . Complication of anesthesia    pt has had headaches post op-did advise to hydra well preop  . Hair loss    right-sided  . History of exercise stress test    a. ETT (12/15) with 12:00 exercise, no ST changes, occasional PACs.   . Hyperlipidemia   . Insomnia   . Migraine headache   . Movement disorder    resltess in left legs  . Postmenopausal   . 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 Premature atrial contractions    a. Holter (12/15) with 8% PACs, no atrial runs or atrial fibrillation.     Tobacco History: Social History   Tobacco Use  Smoking Status Never Smoker  Smokeless Tobacco Never Used   Counseling given: Not Answered   Outpatient Encounter Medications as of 05/25/2017  Medication Sig  . acetaminophen (TYLENOL) 500 MG tablet Place 500 mg into feeding tube 3 (three) times daily. May give an additional 500mg  per g-tube three times daily as needed for mild/moderate pain  . albuterol (PROAIR HFA) 108 (90 Base) MCG/ACT inhaler Inhale 2 puffs into the lungs 2 (two) times daily.  [name] albuterol (PROVENTIL) (2.5 MG/3ML) 0.083% nebulizer solution Take 3 mLs (2.5 mg total) by nebulization every 6 (six) hours as needed for wheezing or shortness of breath. (Patient taking differently: Take 2.5 mg every 6 (six) hours as needed by nebulization for wheezing or shortness of breath (congestion). )  . amantadine (SYMMETREL) 50 MG/5ML solution PLACE 49mL INTO FEEDING TUBE TWICE DAILY (Patient taking differently: PLACE 15 ML (150MG )  INTO FEEDING TUBE EVERY 12 HOURS -  8AM AND NOON)  . Amino Acids-Protein Hydrolys (FEEDING SUPPLEMENT, PRO-STAT SUGAR FREE 64,) LIQD Place 30 mLs into feeding tube 2 (two) times daily.  [name] amLODipine (NORVASC) 10 MG tablet Place 1 tablet (10 mg total) into  feeding tube daily.  [name] aspirin 325 MG tablet Take 325 mg by mouth daily. Until June 28, 2017 then stop per Ortho Dr Sallyanne Havers  . atorvastatin (LIPITOR) 20 MG tablet Place 20 mg into feeding tube at bedtime.   . baclofen (LIORESAL) 10 MG tablet PLACE 1/2 TABLET (5 MG) INTO FEEDING TUBE FOUR TIMES DAILY  . bethanechol (URECHOLINE) 1 mg/mL SUSP Place 5 mg into feeding tube daily.  . bromocriptine (PARLODEL) 2.5 MG tablet Take 2.5-5 mg by mouth See admin instructions. 2.5mg  daily until 12/31 and then increase to 5mg  daily  . Calcium Carb-Cholecalciferol (CALCIUM CARBONATE-VITAMIN D3 PO) Place 2 tablets into feeding tube daily.  . carbidopa-levodopa (SINEMET IR) 25-100 MG tablet Place 2 tablets into feeding tube 3 (three) times daily.  . chlorhexidine (PERIDEX) 0.12 % solution 35ml IN THE MOUTH OR THROAT TWICE DAILY  . Cholecalciferol (VITAMIN D) 2000 units CAPS Place 2,000 Units daily into feeding tube.  . Coenzyme Q10 (COQ10 PO) Place 10 mLs into feeding tube daily. 0600  . enoxaparin (LOVENOX) 40 MG/0.4ML injection Inject 0.4 mLs (40 mg total) into the skin daily.  . fluticasone (FLONASE) 50 MCG/ACT nasal  spray Place 1 spray into both nostrils daily.   [name] gabapentin (NEURONTIN) 100 MG capsule One capsule--open and administer via PEG twice a day. (Patient taking differently: 100 mg See admin instructions. Open one capsule (100 mg) and administer per tube twice daily - 8 am and 5pm)  . guaiFENesin (ROBITUSSIN) 100 MG/5ML SOLN Place 10 mLs (200 mg total) into feeding tube daily as needed for cough.  [name] ibuprofen (ADVIL,MOTRIN) 100 MG/5ML suspension Place 400 mg into feeding tube every 6 (six) hours as needed for mild pain or moderate pain.  [name] ketoconazole (NIZORAL) 2 % cream Apply 1 application topically 2 (two) times daily.  [name] loratadine (CLARITIN) 10 MG tablet Place 1 tablet (10 mg total) into feeding tube daily.  . Melatonin 5 MG CAPS Place 5 mg at bedtime into feeding tube.   . Multiple Vitamin  (MULTIVITAMIN) LIQD Place 15 mLs into feeding tube daily. (Patient taking differently: Place 15 mLs into feeding tube daily. 0600)  . Nutritional Supplements (FEEDING SUPPLEMENT, OSMOLITE 1.5 CAL,) LIQD Give nightly continuous feed for 13 hours at 141ml/hour. Give two bolus feeds of 235ml in between sinemet doses.  [name] oxyCODONE (ROXICODONE) 5 MG/5ML solution Place 2.5-5 mLs (2.5-5 mg total) into feeding tube every 4 (four) hours as needed for moderate pain.  . pantoprazole sodium (PROTONIX) 40 mg/20 mL PACK Place 40 mg into feeding tube daily.  . polyethylene glycol (MIRALAX / GLYCOLAX) packet Place 17 g into feeding tube daily as needed for mild constipation.  . potassium chloride 20 MEQ/15ML (10%) SOLN Place 15 mLs (20 mEq total) into feeding tube daily.  . primidone (MYSOLINE) 50 MG tablet Place 1 tablet (50 mg total) into feeding tube 4 (four) times daily.  [name] Propylene Glycol (SYSTANE BALANCE) 0.6 % SOLN Place 1 drop into both eyes daily.  [name] saccharomyces boulardii (FLORASTOR) 250 MG capsule Place 250 mg into feeding tube 2 (two) times daily.  [name] senna (SENOKOT) 8.6 MG TABS tablet Place 1 tablet (8.6 mg total) into feeding tube 2 (two) times daily.  . sodium chloride HYPERTONIC 3 % nebulizer solution Take by nebulization 2 (two) times daily as needed for other.  . valACYclovir (VALTREX) 500 MG tablet Place 500 mg daily into feeding tube.   . vitamin C (ASCORBIC ACID) 500 MG tablet Place 500 mg daily into feeding tube.  . Water For Irrigation, Sterile (FREE WATER) SOLN Place 200 mLs into feeding tube every 8 (eight) hours. (Patient taking differently: Place 200 mLs 3 (three) times daily into feeding tube. )  . amoxicillin-clavulanate (AUGMENTIN) 600-42.9 MG/5ML suspension 7.110mL by mouth twice daily for 7 days   No facility-administered encounter medications on file as of 05/25/2017.      Review of Systems  Constitutional:   No  Fevers, chills, fatigue, or  lassitude.  HEENT:   No increased  sneezing, itching, ear ache, nasal congestion, post nasal drip,   CV:  No chest pain,  Orthopnea, PND, swelling in lower extremities, anasarca, dizziness, palpitations, syncope.   GI  No abdominal pain, nausea, vomiting, diarrhea, change in bowel habits, loss of appetite, bloody stools.   Resp:   No chest wall deformity  Skin: no rash or lesions.   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  HEENT:  [place]/AT,   NOSE-clear,   NECK:  Supple w/ fair ROM; no JVD; normal carotid impulses w/o bruits; no thyromegaly or nodules palpated; no lymphadenopathy.    RESP  Scattered course rhonchi , no accessory muscle use, no dullness to percussion  CARD:  RRR, no m/r/g, tr  peripheral edema, pulses intact, no cyanosis or clubbing.  GI:   Soft & nt; nml bowel sounds; no organomegaly or masses detected.  PEG   Musco: Warm bil, hand contractures   Neuro: alert, smiles   Skin: Warm, no lesions or rashes    Lab Results:  CBC    Component Value Date/Time   WBC 9.1 02/02/2017 0217   RBC 3.37 (L) 02/02/2017 0217   HGB 9.4 (L) 02/02/2017 0217   HGB 13.1 09/08/2016 1245   HCT 30.7 (L) 02/02/2017 0217   HCT 40.7 09/08/2016 1245   PLT 472 (H) 02/02/2017 0217   PLT 363 09/08/2016 1245   MCV 91.1 02/02/2017 0217   MCV 90 09/08/2016 1245   MCH 27.9 02/02/2017 0217   MCHC 30.6 02/02/2017 0217   RDW 15.9 (H) 02/02/2017 0217   RDW 13.5 09/08/2016 1245   LYMPHSABS 2.0 01/28/2017 1340   MONOABS 0.9 01/28/2017 1340   EOSABS 0.3 01/28/2017 1340   BASOSABS 0.0 01/28/2017 1340    BMET    Component Value Date/Time   NA 132 (L) 02/01/2017 0556   NA 134 09/08/2016 1245   K 4.4 02/01/2017 0556   CL 92 (L) 02/01/2017 0556   CO2 24 02/01/2017 0556   GLUCOSE 112 (H) 02/01/2017 0556   BUN 16 02/01/2017 0556   BUN 10  09/08/2016 1245   CREATININE 0.31 (L) 02/01/2017 0556   CALCIUM 9.0 02/01/2017 0556   GFRNONAA >60 02/01/2017 0556   GFRAA >60 02/01/2017 0556    BNP No results found for: BNP  ProBNP No results found for: PROBNP  Imaging: Dg Thoracic Spine W/swimmers  Result Date: 05/11/2017 CLINICAL DATA:  Acute midthoracic pain for 1 week.  No known injury. EXAM: THORACIC SPINE - 3 VIEWS COMPARISON:  Chest x-ray 01/28/2017.  Chest CT 12/15/2016 FINDINGS: No acute bony abnormality visualized. No visible fracture or subluxation. Diffuse degenerative changes. Mild rightward scoliosis. IMPRESSION: No acute bony abnormality. Electronically Signed   By: Rolm Baptise M.D.   On: 05/11/2017 11:21     Assessment & Plan:   Chronic respiratory failure with hypoxia (HCC) Cont Trach, uncap during daytime if mucus remains thick .  Cont trach collar at night .   Acute tracheobronchitis Does not appear toxic . Will begin empiric abx w/ augmentin , cont w/ chest PT .   Plan  Patient Instructions  Agumentin 875mg  Twice daily  For 7 days via peg.  Continue with Trach care .  Continue with VEST therapy .  Continue with trach collar at night .  Follow up with Dr. Lake Bells as planned and As needed   Please contact office for sooner follow up if symptoms do not improve or worsen or seek emergency care           Rexene Edison, NP 05/25/2017

## 2017-05-25 NOTE — Assessment & Plan Note (Signed)
Does not appear toxic . Will begin empiric abx w/ augmentin , cont w/ chest PT .   Plan  Patient Instructions  Agumentin 875mg  Twice daily  For 7 days via peg.  Continue with Trach care .  Continue with VEST therapy .  Continue with trach collar at night .  Follow up with Dr. Lake Bells as planned and As needed   Please contact office for sooner follow up if symptoms do not improve or worsen or seek emergency care

## 2017-05-25 NOTE — Assessment & Plan Note (Signed)
Cont Trach, uncap during daytime if mucus remains thick .  Cont trach collar at night .

## 2017-05-26 ENCOUNTER — Ambulatory Visit: Payer: Medicare Other | Attending: Physician Assistant

## 2017-05-26 ENCOUNTER — Ambulatory Visit: Payer: Medicare Other | Admitting: Occupational Therapy

## 2017-05-26 ENCOUNTER — Ambulatory Visit: Payer: Medicare Other | Admitting: Physical Therapy

## 2017-05-26 ENCOUNTER — Encounter: Payer: Self-pay | Admitting: Occupational Therapy

## 2017-05-26 DIAGNOSIS — R2689 Other abnormalities of gait and mobility: Secondary | ICD-10-CM | POA: Insufficient documentation

## 2017-05-26 DIAGNOSIS — R293 Abnormal posture: Secondary | ICD-10-CM | POA: Diagnosis present

## 2017-05-26 DIAGNOSIS — M25622 Stiffness of left elbow, not elsewhere classified: Secondary | ICD-10-CM | POA: Insufficient documentation

## 2017-05-26 DIAGNOSIS — R41844 Frontal lobe and executive function deficit: Secondary | ICD-10-CM | POA: Insufficient documentation

## 2017-05-26 DIAGNOSIS — R2681 Unsteadiness on feet: Secondary | ICD-10-CM

## 2017-05-26 DIAGNOSIS — M6281 Muscle weakness (generalized): Secondary | ICD-10-CM

## 2017-05-26 DIAGNOSIS — R29818 Other symptoms and signs involving the nervous system: Secondary | ICD-10-CM

## 2017-05-26 DIAGNOSIS — R4701 Aphasia: Secondary | ICD-10-CM

## 2017-05-26 DIAGNOSIS — M25611 Stiffness of right shoulder, not elsewhere classified: Secondary | ICD-10-CM | POA: Insufficient documentation

## 2017-05-26 DIAGNOSIS — M25612 Stiffness of left shoulder, not elsewhere classified: Secondary | ICD-10-CM

## 2017-05-26 DIAGNOSIS — R41841 Cognitive communication deficit: Secondary | ICD-10-CM | POA: Insufficient documentation

## 2017-05-26 DIAGNOSIS — R471 Dysarthria and anarthria: Secondary | ICD-10-CM | POA: Diagnosis present

## 2017-05-26 DIAGNOSIS — G8250 Quadriplegia, unspecified: Secondary | ICD-10-CM

## 2017-05-26 DIAGNOSIS — R1312 Dysphagia, oropharyngeal phase: Secondary | ICD-10-CM | POA: Insufficient documentation

## 2017-05-26 DIAGNOSIS — R7881 Bacteremia: Secondary | ICD-10-CM | POA: Insufficient documentation

## 2017-05-26 DIAGNOSIS — R258 Other abnormal involuntary movements: Secondary | ICD-10-CM | POA: Insufficient documentation

## 2017-05-26 NOTE — Therapy (Signed)
[facility] [address] [place], [place], [address] Phone: [phone]   Fax:  [phone]  Occupational Therapy Treatment  Patient Details  Name: Judy Spears MRN: [id number] Date of Birth: 23-May-1950 Referring Provider: Dr. Levada Schilling   Encounter Date: 05/26/2017  OT End of Session - 05/26/17 1111    Visit Number  4    Number of Visits  16    Date for OT Re-Evaluation  07/05/17    Authorization Type  medicare     Authorization Time Period  90 days    OT Start Time  1018    OT Stop Time  1100    OT Time Calculation (min)  42 min    Activity Tolerance  Patient tolerated treatment well initially tearful and agitated however improved as session progressed       Past Medical History:  Diagnosis Date  . Arthritis    lt great toe  . Complication of anesthesia    pt has had headaches post op-did advise to hydra well preop  . Hair loss    right-sided  . History of exercise stress test    a. ETT (12/15) with 12:00 exercise, no ST changes, occasional PACs.   . Hyperlipidemia   . Insomnia   . Migraine headache   . Movement disorder    resltess in left legs  . Postmenopausal   . 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 Premature atrial contractions    a. Holter (12/15) with 8% PACs, no atrial runs or atrial fibrillation.     Past Surgical History:  Procedure Laterality Date  . BREAST BIOPSY  01/01/2011   Procedure: BREAST BIOPSY WITH NEEDLE LOCALIZATION;  Surgeon: Rolm Bookbinder, MD;  Location: [facility];  Service: General;  Laterality: Left;  left breast wire localization biopsy  . BREAST BIOPSY Right 07/27/2012  . BREAST BIOPSY Right 07/07/2016  . BREAST BIOPSY Left 06/30/2016  . cataracts right eye    . CESAREAN SECTION  1986  . HERNIA REPAIR  2000   RIH  . INTRAMEDULLARY (IM) NAIL INTERTROCHANTERIC Left 01/30/2017   Procedure: ORIF AFFIXUS NAIL;  Surgeon: Paralee Cancel, MD;  Location: [facility];  Service: Orthopedics;  Laterality: Left;  . IR REPLC GASTRO/COLONIC TUBE PERCUT W/FLUORO  09/29/2016  . opirectinal membrane peel    . PEG PLACEMENT    . RHINOPLASTY  1983  . TRACHEOSTOMY    . TRACHEOSTOMY TUBE PLACEMENT N/A 12/15/2016   Procedure: TRACHEOSTOMY CHANGE;  Surgeon: [name] Montane, MD;  Location: [facility];  Service: ENT;  Laterality: N/A;    Vitals:   05/26/17 1024  SpO2: 90%    Subjective Assessment - 05/26/17 1023    Subjective   Help me, help me (pt tearful and agitated initially during session)    Patient is accompained by:  Family member husband [name], caregiver [name]    Pertinent History  see epic snapshot - ABI/encephalitis/hypoxia from virus; recent hospitalization due to MRSA PNA     Patient Stated Goals  Pt unable to state.     Currently in Pain?  No/denies                   OT Treatments/Exercises (OP) - 05/26/17 0001      Neurological Re-education Exercises   Other Exercises 1  Neuro re ed to address pt's ability to transition weight forward in sitting as well as lateral lean to R in sitting;  addressed finding midline -  pt with very poor awareness of position in space;  no demonstration of automatic postural reactions in sitting . Pt with severe posterior bias in sitting and initially required max assist of 1 in sitting to maintain balance and approximation of midline.  Addressed active sit to stand - pt did participate in all sit to stand and stand to sit activities today however does require max a for all mobility activities.  Addressed postural alignment in standing - pt with signficant R posterior bias and numerous joint restrictions preventing full upright posture.  O2 Sats did improve from 90% to 92% with mobility.  By end of session pt able to sit with supervision for 20 seconds.  Max a to transfer in and out of wheelchair however pt was active participant today.  Pt with signficant LE/trunk tone that impacts sitting balance, sit to stand, standing  balance and stand to sit.  Based on signficant experience with this patient, tone is more severe at this point and negatively impacting functional mobility.  WIll send PN to physiatry and husband [name] to follow up.                 OT Short Term Goals - 05/26/17 1109      OT SHORT TERM GOAL #1   Title  Pt will be mod a for sit to stand from toilet in steady to reduce care giver burden. - 06/07/2017    Status  On-going      OT SHORT TERM GOAL #2   Title  Pt will sit on level surface with 1 UE support only in preparation for participation in basic self care tasks for at least 3 minutes consistently    Status  On-going      OT SHORT TERM GOAL #3   Title  Pt will maintain static standing balance with BUE support during ADL activity  with mod a for at least 3 minutes consistently    Status  On-going      OT SHORT TERM GOAL #4   Title  Pt will roll during LB dressing with max a x1 to reduce caregiver burden.     Status  On-going      OT SHORT TERM GOAL #5   Title  Pt will demonstrate ability to follow simple, functional commands in context at least 50% of the time.     Status  On-going        OT Long Term Goals - 05/26/17 1110      OT LONG TERM GOAL #1   Title  Pt will go from sit to stand from toilet with steady with min a to reduce caregiver burden - 07/05/2017    Status  On-going      OT LONG TERM GOAL #2   Title  Pt will sit on level surfaces for participation in basic self care tasks with no UE support for at least 5 minutes    Status  On-going      OT LONG TERM GOAL #3   Title  Pt will maintain static standing balance with BUE support duirng ADL activity with min a for at least 3 minutes    Status  On-going      OT LONG TERM GOAL #4   Title  Pt wil roll during LB dressing with mod a x1 to reduce caregiver burden.      Status  On-going            Plan - 05/26/17 1110  Clinical Impression Statement  Pt with slow progress toward goals however was more  participatory today and demonstrating improved sitting balance. Spasticity in LE's/trunk impacting functional mobility.      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    Occupational performance deficits (Please refer to evaluation for details):  ADL's;IADL's;Rest and Sleep;Leisure;Social Participation;Work    Physicist, medical    Current Impairments/barriers affecting progress:  severity of deficits, very slow rate of recovery; pt with recent significant decline over past several months due medical complications    OT Frequency  2x / week    OT Duration  8 weeks    OT Treatment/Interventions  Self-care/ADL training;Ultrasound;Moist Heat;Electrical Stimulation;DME and/or AE instruction;Neuromuscular education;Therapeutic exercise;Functional Mobility Training;Manual Therapy;Passive range of motion;Splinting;Therapeutic activities;Balance training;Patient/family education;Visual/perceptual remediation/compensation;Cognitive remediation/compensation    Plan   review splints from prior episodes, NMR trunk and LE's for transitional movements, static standing, sitting balance, sit to stand, stand to sit.     Consulted and Agree with Plan of Care  Patient;Family member/caregiver    Family Member Consulted  husband [name] and caregiver Leesa       Patient will benefit from skilled therapeutic intervention in order to improve the following deficits and impairments:  Decreased endurance, Decreased skin integrity, Impaired vision/preception, Improper body mechanics, Impaired perceived functional ability, Decreased knowledge of precautions, Cardiopulmonary status limiting activity, Decreased activity tolerance, Decreased knowledge of use of DME, Decreased strength, Impaired flexibility, Improper  spinal/pelvic alignment, Impaired sensation, Difficulty walking, Decreased mobility, Decreased balance, Decreased cognition, Decreased range of motion, Impaired tone, Impaired UE functional use, Decreased safety awareness, Decreased coordination, Pain, Abnormal gait  Visit Diagnosis: Muscle weakness (generalized)  Abnormal posture  Unsteadiness on feet  Other symptoms and signs involving the nervous system  Stiffness of left elbow, not elsewhere classified  Frontal lobe and executive function deficit  Stiffness of left shoulder, not elsewhere classified  Stiffness of right shoulder, not elsewhere classified  Spastic tetraplegia (HCC)  Other abnormalities of gait and mobility    Problem List Patient Active Problem List   Diagnosis Date Noted  . Coagulase negative Staphylococcus bacteremia 02/09/2017  . Abdominal distension   . Chronic mucus hypersecretion, respiratory 12/31/2016  . Severe protein-calorie malnutrition [name]: less than 60% of standard weight) ([facility]) 12/31/2016  . Acute urinary retention 12/31/2016  . Constipation 12/31/2016  . Shortness of breath   . Respiratory distress   . Pneumonia 12/05/2016  . Acute respiratory failure ([facility]) 12/05/2016  . Pseudomonas urinary tract infection 10/27/2016  . Severe muscle deconditioning 09/30/2016  . Pressure injury of skin 09/28/2016  . Respiratory failure ([facility])   . Tracheobronchitis   . Urinary retention   . PEG (percutaneous endoscopic gastrostomy) status ([facility])   . Benign essential HTN   . Hyponatremia   . Hypokalemia   . Reactive hypertension   . Respiratory insufficiency 09/24/2016  . Malignant neoplasm of upper-inner quadrant of left breast in female, estrogen receptor positive ([facility]) 07/14/2016  . Heterotopic ossification of bone 03/10/2016  . Acute on chronic respiratory failure with hypoxia ([facility])   . Acute hypoxemic respiratory failure ([facility])   . Acute blood loss anemia   . Atelectasis 02/04/2016  .  Debility 02/03/2016  . Pneumonia of both lower lobes due to Pseudomonas species ([facility])   . Pneumonia of both lower lobes due to methicillin  susceptible Staphylococcus aureus (MSSA) ([facility])   . Acute respiratory failure with hypoxia ([facility])   . Acute encephalopathy   . Seizures ([facility])   . Sepsis ([facility])   . Hypoxia   . Pain   . Chronic respiratory failure ([facility]) 12/28/2015  . Acute tracheobronchitis 12/24/2015  . Tracheostomy tube present ([facility])   . Tracheal stenosis   . Chronic respiratory failure with hypoxia ([facility])   . Spastic tetraplegia ([facility]) 10/20/2015  . Tracheostomy status ([facility]) 09/26/2015  . Allergic rhinitis 09/26/2015  . 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 Movement disorder 09/22/2015  . Encephalitis 09/22/2015  . Chest pain 02/07/2014  . Premature atrial contractions 01/13/2014  . Abnormality of gait 12/04/2013  . Hyperlipidemia 12/21/2011  . Cardiovascular risk factor 12/21/2011  . Bunion 01/31/2008  . Metatarsalgia of both feet 09/20/2007  . FLAT FOOT 09/20/2007  . HALLUX RIGIDUS, ACQUIRED 09/20/2007    Quay Burow, OTR/L 05/26/2017, [facility] [address] [place], [place], [address] Phone: [phone]   Fax:  [phone]  Name: Judy Spears MRN: [id number] Date of Birth: August 01, 1950

## 2017-05-26 NOTE — Progress Notes (Signed)
Reviewed, agree 

## 2017-05-26 NOTE — Therapy (Signed)
[facility] [address] [place], [place], [address] Phone: [phone]   Fax:  [phone]  Speech Language Pathology Treatment  Patient Details  Name: Judy Spears MRN: [id number] Date of Birth: February 11, 1950 Referring Provider: Levada Schilling, MD   Encounter Date: 05/26/2017  End of Session - 05/26/17 1811    Visit Number  5    Number of Visits  17    Date for SLP Re-Evaluation  07/22/17    SLP Start Time  1108 pt receiving trach care - late to ST    SLP Stop Time   1145    SLP Time Calculation (min)  37 min    Activity Tolerance  Patient limited by lethargy       Past Medical History:  Diagnosis Date  . Arthritis    lt great toe  . Complication of anesthesia    pt has had headaches post op-did advise to hydra well preop  . Hair loss    right-sided  . History of exercise stress test    a. ETT (12/15) with 12:00 exercise, no ST changes, occasional PACs.   . Hyperlipidemia   . Insomnia   . Migraine headache   . Movement disorder    resltess in left legs  . Postmenopausal   . 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 Premature atrial contractions    a. Holter (12/15) with 8% PACs, no atrial runs or atrial fibrillation.     Past Surgical History:  Procedure Laterality Date  . BREAST BIOPSY  01/01/2011   Procedure: BREAST BIOPSY WITH NEEDLE LOCALIZATION;  Surgeon: Rolm Bookbinder, MD;  Location: [facility];  Service: General;  Laterality: Left;  left breast wire localization biopsy  . BREAST BIOPSY Right 07/27/2012  . BREAST BIOPSY Right 07/07/2016  . BREAST BIOPSY Left 06/30/2016  . cataracts right eye    . CESAREAN SECTION  1986  . HERNIA REPAIR  2000   RIH  . INTRAMEDULLARY (IM) NAIL INTERTROCHANTERIC Left 01/30/2017   Procedure: ORIF AFFIXUS NAIL;  Surgeon: Paralee Cancel, MD;  Location: [facility];  Service: Orthopedics;  Laterality: Left;  . IR REPLC GASTRO/COLONIC TUBE PERCUT W/FLUORO   09/29/2016  . opirectinal membrane peel    . PEG PLACEMENT    . RHINOPLASTY  1983  . TRACHEOSTOMY    . TRACHEOSTOMY TUBE PLACEMENT N/A 12/15/2016   Procedure: TRACHEOSTOMY CHANGE;  Surgeon: [name] Montane, MD;  Location: [facility];  Service: ENT;  Laterality: N/A;    There were no vitals filed for this visit.  Subjective Assessment - 05/26/17 1210    Subjective  Pt with pulmonology visit yesterday - abx course began yesterday. SLP reiterated the need for pt to have oral care less than 30 minutes prior to any PO intake.    Patient is accompained by:  Family member [name] and [name]    Currently in Pain?  No/denies            ADULT SLP TREATMENT - 05/26/17 1758      General Information   Behavior/Cognition  Lethargic;Requires cueing;Agitated;Cooperative;Pleasant mood      Treatment Provided   Treatment provided  Cognitive-Linquistic      Cognitive-Linquistic Treatment   Treatment focused on  Dysarthria    Skilled Treatment  Given pt's visit to pulmonologist office yesterday SLP again reminded pt's husband/pt/caregiver the importance of oral care less than 30 minutes prior to any POs of any amount. Initially pt labile, attempting to verbalize at the  same time as crying. SLP redirected pt and pt stopped after approx 3 minutes, for the rest of the session and no tearfulness after that time. Pt with decr'd alertness today, somnolent requiring usual verbal, tactile cues for increased alertness. SLP engaged pt in simple ABAB or AB conversation segments today, given her decr'd alertness. Pt with congestion as well, hindering her intelligiblity in even 1-syllable words. Pt's intelligibility was better for caregiver and for husband than for SLP (approx 70%, 60%, and 40% respectively). 25 minutes into session pt was suctioned via trach and pt's intelligibility did not spontaneously nor notably improve, due to decr'd amplitude of pt's articulators. Pt req'd visual, verbal and tactile cueing to improve  amplitude and was rarely successful at improving overall articulation.       Assessment / Recommendations / Plan   Plan  Continue with current plan of care      Progression Toward Goals   Progression toward goals  Not progressing toward goals (comment) medical complications, somnolence       SLP Education - 05/26/17 1811    Education provided  Yes    Education Details  need for oral care less than 30 minutes prior to any POs    Person(s) Educated  Patient;Caregiver(s);Spouse    Methods  Explanation    Comprehension  Verbalized understanding       SLP Short Term Goals - 05/26/17 1815      SLP SHORT TERM GOAL #1   Title  Pt will demonstrate dysarthria HEP to increase articulatory coordination for speech with usual mod A over 3 sessions    Time  3    Period  Weeks    Status  On-going      SLP SHORT TERM GOAL #2   Title  Pt will achieve phonation adequate for SLP intelligibility in imitated two-syllable words using intelligibility compensations 12/20 trials with usual min A in 3 sessions    Time  3    Period  Weeks    Status  On-going      SLP SHORT TERM GOAL #3   Title  Pt will imitate 2-syllable words or phrases with functional intelligibility for SLP in 5/15 trials over 3 sessions     Time  3    Period  Weeks    Status  On-going      SLP SHORT TERM GOAL #4   Title  Pt will sustain attention to therapy tasks for 10 minutes with multimodal cues in 5 sessions    Baseline  05-23-17    Time  3    Period  Weeks    Status  On-going      SLP SHORT TERM GOAL #5   Title  pt family will demo understanding of oral care regimen, including safe ingestion of water/ice chips over three sessions    Time  3    Period  Weeks    Status  On-going      SLP SHORT TERM GOAL #6   Title  pt will demo dysphagia HEP to improve management of secretions and safety with any possible POs    Time  3    Period  Weeks    Status  On-going      SLP SHORT TERM GOAL #7   Title  family/caregivers  will demo understanding of pt swallowing precautions, for pt safety with POs, by verbal report     Time  3    Period  Weeks    Status  On-going  SLP Long Term Goals - 05/26/17 1814      SLP LONG TERM GOAL #1   Title  Pt will demo dysarthria  HEP to incr articulatory coordination for speech, with occasional mod A over three sessions    Time  7    Period  Weeks    Status  On-going      SLP LONG TERM GOAL #2   Title  pt will achieve phonation adequate for SLP intelligibility in imitated 3-syllable words/3-word phrases using intelligibility compenastions 15/20 trials    Time  7    Period  Weeks    Status  On-going      SLP LONG TERM GOAL #3   Title  Pt will imitate 2-syllable words or phrases with functional intelligibility for SLP in 8/15 trials over three sessions    Time  7    Period  Weeks    Status  On-going      SLP LONG TERM GOAL #4   Title  pt will sustain attention to therapy tasks for 10 minutes with multimodal cues in 10 sessions    Time  7    Period  Weeks    Status  On-going      SLP LONG TERM GOAL #5   Title  pt will demo dysphagia HEP to improve management of secretions and safety with possible POs    Time  7    Period  Weeks    Status  On-going       Plan - 05/26/17 1812    Clinical Impression Statement  Patient with persistent deficits in speech intelligibility (dysarthria, reduced breath support), cognitive communication and swallowing. Pt session limited by lethargy/somnolence/decr'd alertness, and by (suspected) pulmonary/trach congestion. See skilled intervention for further details. Recommend direct skilled ST to address pt's decline in communication skills, and to monitor a HEP for dysphagia and dysarthria in order to maximize functional communication, secretion management/ swallowing safety, reduce caregiver burden and improve quality of life.     Speech Therapy Frequency  2x / week    Treatment/Interventions  Aspiration precaution training;Trials  of upgraded texture/liquids;Compensatory strategies;Functional tasks;Patient/family education;Cueing hierarchy;Diet toleration management by SLP;Cognitive reorganization;Compensatory techniques;SLP instruction and feedback;Internal/external aids;Multimodal communcation approach;Pharyngeal strengthening exercises;Oral motor exercises    Potential to Achieve Goals  Fair    Potential Considerations  Ability to learn/carryover information;Co-morbidities;Severity of impairments;Cooperation/participation level;Previous level of function    Consulted and Agree with Plan of Care  Patient;Family member/caregiver    Family Member Consulted  Husband, [name]       Patient will benefit from skilled therapeutic intervention in order to improve the following deficits and impairments:   Dysarthria and anarthria  Dysphagia, oropharyngeal phase  Cognitive communication deficit  Aphasia    Problem List Patient Active Problem List   Diagnosis Date Noted  . Coagulase negative Staphylococcus bacteremia 02/09/2017  . Abdominal distension   . Chronic mucus hypersecretion, respiratory 12/31/2016  . Severe protein-calorie malnutrition [name]: less than 60% of standard weight) ([facility]) 12/31/2016  . Acute urinary retention 12/31/2016  . Constipation 12/31/2016  . Shortness of breath   . Respiratory distress   . Pneumonia 12/05/2016  . Acute respiratory failure ([facility]) 12/05/2016  . Pseudomonas urinary tract infection 10/27/2016  . Severe muscle deconditioning 09/30/2016  . Pressure injury of skin 09/28/2016  . Respiratory failure ([facility])   . Tracheobronchitis   . Urinary retention   . PEG (percutaneous endoscopic gastrostomy) status ([facility])   . Benign essential HTN   . Hyponatremia   .  Hypokalemia   . Reactive hypertension   . Respiratory insufficiency 09/24/2016  . Malignant neoplasm of upper-inner quadrant of left breast in female, estrogen receptor positive ([facility]) 07/14/2016  . Heterotopic ossification of  bone 03/10/2016  . Acute on chronic respiratory failure with hypoxia ([facility])   . Acute hypoxemic respiratory failure ([facility])   . Acute blood loss anemia   . Atelectasis 02/04/2016  . Debility 02/03/2016  . Pneumonia of both lower lobes due to Pseudomonas species ([facility])   . Pneumonia of both lower lobes due to methicillin susceptible Staphylococcus aureus (MSSA) ([facility])   . Acute respiratory failure with hypoxia ([place])   . Acute encephalopathy   . Seizures ([facility])   . Sepsis ([facility])   . Hypoxia   . Pain   . Chronic respiratory failure ([facility]) 12/28/2015  . Acute tracheobronchitis 12/24/2015  . Tracheostomy tube present ([facility])   . Tracheal stenosis   . Chronic respiratory failure with hypoxia ([facility])   . 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 Tracheostomy status ([facility]) 09/26/2015  . Allergic rhinitis 09/26/2015  . 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 Movement disorder 09/22/2015  . Encephalitis 09/22/2015  . Chest pain 02/07/2014  . Premature atrial contractions 01/13/2014  . Abnormality of gait 12/04/2013  . Hyperlipidemia 12/21/2011  . Cardiovascular risk factor 12/21/2011  . Bunion 01/31/2008  . Metatarsalgia of both feet 09/20/2007  . FLAT FOOT 09/20/2007  . HALLUX RIGIDUS, ACQUIRED 09/20/2007    [facility] ,[place], [place]  05/26/2017, 8:51 PM  [facility] [address] [place], [place], [address] Phone: [phone]   Fax:  [phone]   Name: Karmen Altamirano MRN: [id number] Date of Birth: 1950-09-19

## 2017-05-27 ENCOUNTER — Ambulatory Visit
Admission: RE | Admit: 2017-05-27 | Discharge: 2017-05-27 | Disposition: A | Discharge status: Home / Self Care | Payer: Medicare Other | Source: Ambulatory Visit | Attending: Internal Medicine | Admitting: Internal Medicine

## 2017-05-27 ENCOUNTER — Other Ambulatory Visit: Payer: Self-pay | Admitting: Internal Medicine

## 2017-05-27 ENCOUNTER — Encounter: Payer: Self-pay | Admitting: Physical Therapy

## 2017-05-27 DIAGNOSIS — R059 Cough, unspecified: Secondary | ICD-10-CM

## 2017-05-27 DIAGNOSIS — R05 Cough: Secondary | ICD-10-CM

## 2017-05-27 NOTE — Therapy (Signed)
[facility] [address] [place], [place], [address] Phone: [phone]   Fax:  [phone]  Physical Therapy Treatment  Patient Details  Name: Judy Spears MRN: [id number] Date of Birth: [birth year] Referring Provider: Dr. Levada Schilling   Encounter Date: 05/26/2017  PT End of Session - 05/27/17 1556    Visit Number  3    Number of Visits  17    Date for PT Re-Evaluation  07/09/17    Authorization Type  [employer] Medicare    Authorization Time Period  05-10-17 - 08-08-17    PT Start Time  7209    PT Stop Time  1426    PT Time Calculation (min)  49 min       Past Medical History:  Diagnosis Date  . Arthritis    lt great toe  . Complication of anesthesia    pt has had headaches post op-did advise to hydra well preop  . Hair loss    right-sided  . History of exercise stress test    a. ETT (12/15) with 12:00 exercise, no ST changes, occasional PACs.   . Hyperlipidemia   . Insomnia   . Migraine headache   . Movement disorder    resltess in left legs  . Postmenopausal   . 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 Premature atrial contractions    a. Holter (12/15) with 8% PACs, no atrial runs or atrial fibrillation.     Past Surgical History:  Procedure Laterality Date  . BREAST BIOPSY  01/01/2011   Procedure: BREAST BIOPSY WITH NEEDLE LOCALIZATION;  Surgeon: Rolm Bookbinder, MD;  Location: [facility];  Service: General;  Laterality: Left;  left breast wire localization biopsy  . BREAST BIOPSY Right 07/27/2012  . BREAST BIOPSY Right 07/07/2016  . BREAST BIOPSY Left 06/30/2016  . cataracts right eye    . CESAREAN SECTION  1986  . HERNIA REPAIR  2000   RIH  . INTRAMEDULLARY (IM) NAIL INTERTROCHANTERIC Left 01/30/2017   Procedure: ORIF AFFIXUS NAIL;  Surgeon: Paralee Cancel, MD;  Location: [facility];  Service: Orthopedics;  Laterality: Left;  . IR REPLC GASTRO/COLONIC TUBE PERCUT W/FLUORO  09/29/2016  .  opirectinal membrane peel    . PEG PLACEMENT    . RHINOPLASTY  1983  . TRACHEOSTOMY    . TRACHEOSTOMY TUBE PLACEMENT N/A 12/15/2016   Procedure: TRACHEOSTOMY CHANGE;  Surgeon: [name] Montane, MD;  Location: [facility];  Service: ENT;  Laterality: N/A;    There were no vitals filed for this visit.  Subjective Assessment - 05/27/17 1546    Subjective  Pt very sleepy - had OT and ST this morning - has returned for 3:30 PT appt.:  Judy Spears states they saw the ortho MD yesterday who said no precautions and to now work on getting the muscles moving again    Patient is accompained by:  -- Judy Spears    Pertinent History  Pow[place]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  No/denies                       [facility] Adult PT Treatment/Exercise - 05/27/17 0001      Bed Mobility   Bed Mobility  Sit to Supine;Supine to Sit    Supine to Sit  1: +1 Total assist    Sit to  Supine  2: Max assist    Sit to Supine - Details (indicate cue type and reason)  wedge with 2 pillows used; total assist to transfer LE's onto mat       Transfers   Transfers  Stand Pivot Transfers    Stand Pivot Transfers  1: +1 Total assist    Number of Reps  Other reps (comment) 2 reps - w/c to/from mat    Transfer Cueing  cues to move RLE - pt unable       Exercises   Exercises  Knee/Hip      Knee/Hip Exercises: Supine   Heel Slides  PROM;AAROM;Both;2 sets;10 reps    Bridges  AAROM;1 set;10 reps    Other Supine Knee/Hip Exercises  bil. hip abduction in hooklying with max to mod assist - gentle stretch at end range of motion as tolerated: 2 sets 10 reps each leg - attempting to stretch hip adductors      TherAct:  Pt stood in standing frame approx. 10" with mod to max assist to shift hips/pelvis to left for midline positioning  Attempted active hip and knee extension by releasing pt from full standing position - cues to push up,  bring tummy forward toward tray area of  Standing frame; minimal active movement noted with this activity  Pt transferred from mat to wheelchair at end of session with total assist - stand pivot transfer   Pt very fatigued at end of session   NeuroRe-ed; worked on unsupported sitting balance on side of mat - min to mod assist needed, depending on fatigue       PT Short Term Goals - 05/11/17 2113      PT SHORT TERM GOAL #1   Title  Perform sit to/from stand from wheelchair or mat with +1 mod assist.    Baseline  max to total assist on 05-11-17    Time  4    Period  Weeks    Status  New    Target Date  06/09/17      PT SHORT TERM GOAL #2   Title  Pt will perform sit to stand from wheelchair to [place] with max assist for pre-gait skill.    Baseline  max to total assist +2 hand held assist - 05-10-17    Time  4    Period  Weeks    Status  New    Target Date  06/09/17      PT SHORT TERM GOAL #3   Title  Pt will transfer supine to sit with max assist.    Baseline  total assist on 05-10-17    Time  4    Period  Weeks    Status  New    Target Date  06/09/17      PT SHORT TERM GOAL #4   Title  Pt will sit with RUE support with min to mod assist unsupported on side of mat for at least 3" to demo improved trunk strength and core stabilization.    Baseline  performance varies - pt able to sit with RUE support for approx. 1" with CGA to max assist with LOB     Time  4    Period  Weeks    Status  New    Target Date  06/09/17      PT SHORT TERM GOAL #5   Title  Pt will amb. 15' with appropriate assistive device with max assist +1    Baseline  total  assist +2 -    05-10-17    Time  4    Period  Weeks    Status  New    Target Date  06/09/17        PT Long Term Goals - 05/11/17 2122      PT LONG TERM GOAL #1   Title  Pt will perform supine rolling R and L with min assist  to indicate improved independence with bed mobility.      Baseline  dependent for all bed mobility per  spouse's report - 05-10-17    Time  8    Period  Weeks    Status  New    Target Date  07/09/17      PT LONG TERM GOAL #2   Title  Pt will sit on side of mat with RUE support for at least 5" with SBA     Baseline  max assist to min assist - ability varies    Time  8    Period  Weeks    Status  New    Target Date  07/09/17      PT LONG TERM GOAL #3   Title  Pt will amb. 20' with appropriate assistive device with +1 max assist.    Baseline  +2 max - total assist- pt unable to advance LE's - feet not clearing due to pt not wearing AFO's - 05-10-17    Time  8    Period  Weeks    Status  New    Target Date  07/09/17      PT LONG TERM GOAL #4   Title  Pt will perform basic transfers with min to mod assist to indicate improved functional independence.      Baseline  max assist - 05-10-17    Time  8    Period  Weeks    Target Date  07/09/17      PT LONG TERM GOAL #5   Title  Pt will perform static standing x 3" with bil. UE support with mod assist.      Time  8    Period  Weeks    Status  New    Target Date  07/09/17      Additional Long Term Goals   Additional Long Term Goals  Yes      PT LONG TERM GOAL #6   Title  Pt will be able to return to participation in therapeutic horseback riding at [facility] for ongoing exercise program.    Time  8    Period  Weeks    Status  New    Target Date  07/09/17            Plan - 05/27/17 1556    Clinical Impression Statement  Pt tolerated lying supine today without discomfort that she had at time of initial eval on 05-11-17 at which time she began crying due to c/o back pain in supine position at that time.  Pt tolerated having legs extended in supine position but some agitation noted with pt unable to relax legs completely.  Lt hip and knee noted to have slightly less flexion compared to RLE.  pt stood in standing frame but needed mod to max assist to keep hips/pelvis in midline as her hips were shifted to right side.  Pt unable to  extend hips and knees in standing frame in order to bring abdomen forward toward tray area.  Rehab Potential  Fair    Clinical Impairments Affecting Rehab Potential  severity of deficits with previous extensive therapies    PT Frequency  2x / week    PT Duration  8 weeks    PT Treatment/Interventions  ADLs/Self Care Home Management;Therapeutic exercise;Therapeutic activities;Functional mobility training;Gait training;Balance training;Neuromuscular re-education;Patient/family education;Orthotic Fit/Training;Passive range of motion    PT Next Visit Plan  transfer training, sitting balance; can attempt supine if pt able to tolerate based on back pain; standing as tolerated; will need to contact Gerald Stabs and have him come look at her now (? if candidate for ambulation - do we need new AFO's?)     PT Home Exercise Plan  caregiver and husband have previously been instructed in ROM, sitting balance, and transfer training    Consulted and Agree with Plan of Care  Patient       Patient will benefit from skilled therapeutic intervention in order to improve the following deficits and impairments:  Abnormal gait, Decreased activity tolerance, Decreased balance, Decreased cognition, Decreased coordination, Decreased mobility, Decreased range of motion, Decreased endurance, Hypomobility, Decreased strength, Impaired flexibility, Increased muscle spasms, Impaired UE functional use, Postural dysfunction, Pain  Visit Diagnosis: Other abnormalities of gait and mobility  Muscle weakness (generalized)  Abnormal posture     Problem List Patient Active Problem List   Diagnosis Date Noted  . Coagulase negative Staphylococcus bacteremia 02/09/2017  . Abdominal distension   . Chronic mucus hypersecretion, respiratory 12/31/2016  . Severe protein-calorie malnutrition [name]: less than 60% of standard weight) ([place]) 12/31/2016  . Acute urinary  retention 12/31/2016  . Constipation 12/31/2016  . Shortness of breath   . Respiratory distress   . Pneumonia 12/05/2016  . Acute respiratory failure ([place]) 12/05/2016  . Pseudomonas urinary tract infection 10/27/2016  . Severe muscle deconditioning 09/30/2016  . Pressure injury of skin 09/28/2016  . Respiratory failure ([place])   . Tracheobronchitis   . Urinary retention   . PEG (percutaneous endoscopic gastrostomy) status ([place])   . Benign essential HTN   . Hyponatremia   . Hypokalemia   . Reactive hypertension   . Respiratory insufficiency 09/24/2016  . Malignant neoplasm of upper-inner quadrant of left breast in female, estrogen receptor positive ([facility]) 07/14/2016  . Heterotopic ossification of bone 03/10/2016  . Acute on chronic respiratory failure with hypoxia ([facility])   . Acute hypoxemic respiratory failure ([place])   . Acute blood loss anemia   . Atelectasis 02/04/2016  . Debility 02/03/2016  . Pneumonia of both lower lobes due to Pseudomonas species ([place])   . Pneumonia of both lower lobes due to methicillin susceptible Staphylococcus aureus (MSSA) ([place])   . Acute respiratory failure with hypoxia ([place])   . Acute encephalopathy   . Seizures ([place])   . Sepsis ([place])   . Hypoxia   . Pain   . Chronic respiratory failure ([place]) 12/28/2015  . Acute tracheobronchitis 12/24/2015  . Tracheostomy tube present ([place])   . Tracheal stenosis   . Chronic respiratory failure with hypoxia ([place])   . Spastic tetraplegia ([place]) 10/20/2015  . Tracheostomy status ([place]) 09/26/2015  . Allergic rhinitis 09/26/2015  . 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 Movement disorder 09/22/2015  . Encephalitis 09/22/2015  . Chest pain 02/07/2014  . Premature atrial contractions 01/13/2014  . Abnormality of gait 12/04/2013  . Hyperlipidemia 12/21/2011  . Cardiovascular risk factor 12/21/2011  . Bunion 01/31/2008  . Metatarsalgia of both feet 09/20/2007  . FLAT FOOT 09/20/2007  . HALLUX RIGIDUS,  ACQUIRED  09/20/2007    Alda Lea, PT 05/27/2017, 4:07 PM  [facility] [address] [place], [place], [address] Phone: [phone]   Fax:  [phone]  Name: Desteni Piscopo MRN: [id number] Date of Birth: Oct 05, 1950

## 2017-05-30 ENCOUNTER — Encounter: Payer: Self-pay | Admitting: Physical Therapy

## 2017-05-30 ENCOUNTER — Ambulatory Visit: Payer: Medicare Other | Admitting: Physical Therapy

## 2017-05-30 ENCOUNTER — Ambulatory Visit: Payer: Medicare Other | Admitting: Occupational Therapy

## 2017-05-30 ENCOUNTER — Encounter: Payer: Self-pay | Admitting: Occupational Therapy

## 2017-05-30 DIAGNOSIS — R293 Abnormal posture: Secondary | ICD-10-CM

## 2017-05-30 DIAGNOSIS — R7881 Bacteremia: Secondary | ICD-10-CM

## 2017-05-30 DIAGNOSIS — G8250 Quadriplegia, unspecified: Secondary | ICD-10-CM

## 2017-05-30 DIAGNOSIS — R41844 Frontal lobe and executive function deficit: Secondary | ICD-10-CM

## 2017-05-30 DIAGNOSIS — R2681 Unsteadiness on feet: Secondary | ICD-10-CM

## 2017-05-30 DIAGNOSIS — M25622 Stiffness of left elbow, not elsewhere classified: Secondary | ICD-10-CM

## 2017-05-30 DIAGNOSIS — R471 Dysarthria and anarthria: Secondary | ICD-10-CM | POA: Diagnosis not present

## 2017-05-30 DIAGNOSIS — M25612 Stiffness of left shoulder, not elsewhere classified: Secondary | ICD-10-CM

## 2017-05-30 DIAGNOSIS — R29818 Other symptoms and signs involving the nervous system: Secondary | ICD-10-CM

## 2017-05-30 DIAGNOSIS — M6281 Muscle weakness (generalized): Secondary | ICD-10-CM

## 2017-05-30 DIAGNOSIS — R2689 Other abnormalities of gait and mobility: Secondary | ICD-10-CM

## 2017-05-30 DIAGNOSIS — R258 Other abnormal involuntary movements: Secondary | ICD-10-CM

## 2017-05-30 DIAGNOSIS — M25611 Stiffness of right shoulder, not elsewhere classified: Secondary | ICD-10-CM

## 2017-05-30 NOTE — Therapy (Signed)
[facility] [address] [place], [place], [address] Phone: [phone]   Fax:  [phone]  Occupational Therapy Treatment  Patient Details  Name: Judy Spears MRN: [id number] Date of Birth: 1950/05/16 Referring Provider: Dr. Levada Schilling   Encounter Date: 05/30/2017  OT End of Session - 05/30/17 1717    Visit Number  5    Number of Visits  16    Date for OT Re-Evaluation  07/05/17    Authorization Type  medicare     Authorization Time Period  90 days    OT Start Time  1148    OT Stop Time  1230    OT Time Calculation (min)  42 min    Activity Tolerance  Patient limited by lethargy       Past Medical History:  Diagnosis Date  . Arthritis    lt great toe  . Complication of anesthesia    pt has had headaches post op-did advise to hydra well preop  . Hair loss    right-sided  . History of exercise stress test    a. ETT (12/15) with 12:00 exercise, no ST changes, occasional PACs.   . Hyperlipidemia   . Insomnia   . Migraine headache   . Movement disorder    resltess in left legs  . Postmenopausal   . 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 Premature atrial contractions    a. Holter (12/15) with 8% PACs, no atrial runs or atrial fibrillation.     Past Surgical History:  Procedure Laterality Date  . BREAST BIOPSY  01/01/2011   Procedure: BREAST BIOPSY WITH NEEDLE LOCALIZATION;  Surgeon: Rolm Bookbinder, MD;  Location: [facility];  Service: General;  Laterality: Left;  left breast wire localization biopsy  . BREAST BIOPSY Right 07/27/2012  . BREAST BIOPSY Right 07/07/2016  . BREAST BIOPSY Left 06/30/2016  . cataracts right eye    . CESAREAN SECTION  1986  . HERNIA REPAIR  2000   RIH  . INTRAMEDULLARY (IM) NAIL INTERTROCHANTERIC Left 01/30/2017   Procedure: ORIF AFFIXUS NAIL;  Surgeon: Paralee Cancel, MD;  Location: [facility];  Service: Orthopedics;  Laterality: Left;  . IR REPLC GASTRO/COLONIC  TUBE PERCUT W/FLUORO  09/29/2016  . opirectinal membrane peel    . PEG PLACEMENT    . RHINOPLASTY  1983  . TRACHEOSTOMY    . TRACHEOSTOMY TUBE PLACEMENT N/A 12/15/2016   Procedure: TRACHEOSTOMY CHANGE;  Surgeon: [name] Montane, MD;  Location: [facility];  Service: ENT;  Laterality: N/A;    There were no vitals filed for this visit.  Subjective Assessment - 05/30/17 1155    Subjective   Pt moaning today but said No when asked if she was in any pain    Patient is accompained by:  Family member husband [name] and caregiver Leesa    Pertinent History  see epic snapshot - ABI/encephalitis/hypoxia from virus; recent hospitalization due to MRSA PNA     Patient Stated Goals  Pt unable to state.     Currently in Pain?  No/denies                   OT Treatments/Exercises (OP) - 05/30/17 0001      Neurological Re-education Exercises   Other Exercises 1  Neuro re ed to address static sitting balance, finding midline, forward leaning and weight shift in sitting. Also addressed sit to stand, stand to sit with max facilitation and max assist.  Pt intermittently  lethargic today however caregiver and husband report pt has pneumonia and has been started on new antibiotic.  O2 Sats 92%      Manual Therapy   Manual Therapy  Soft tissue mobilization;Myofascial release    Manual therapy comments  soft tissue and myofasical release to address trunk alignment as well as encourage hip extension as prep for sitting and standing.                OT Short Term Goals - 05/30/17 1715      OT SHORT TERM GOAL #1   Title  Pt will be mod a for sit to stand from toilet in steady to reduce care giver burden. - 06/07/2017    Status  On-going      OT SHORT TERM GOAL #2   Title  Pt will sit on level surface with 1 UE support only in preparation for participation in basic self care tasks for at least 3 minutes consistently    Status  On-going      OT SHORT TERM GOAL #3   Title  Pt will maintain static  standing balance with BUE support during ADL activity  with mod a for at least 3 minutes consistently    Status  On-going      OT SHORT TERM GOAL #4   Title  Pt will roll during LB dressing with max a x1 to reduce caregiver burden.     Status  On-going      OT SHORT TERM GOAL #5   Title  Pt will demonstrate ability to follow simple, functional commands in context at least 50% of the time.     Status  On-going        OT Long Term Goals - 05/30/17 1716      OT LONG TERM GOAL #1   Title  Pt will go from sit to stand from toilet with steady with min a to reduce caregiver burden - 07/05/2017    Status  On-going      OT LONG TERM GOAL #2   Title  Pt will sit on level surfaces for participation in basic self care tasks with no UE support for at least 5 minutes    Status  On-going      OT LONG TERM GOAL #3   Title  Pt will maintain static standing balance with BUE support duirng ADL activity with min a for at least 3 minutes    Status  On-going      OT LONG TERM GOAL #4   Title  Pt wil roll during LB dressing with mod a x1 to reduce caregiver burden.      Status  On-going            Plan - 05/30/17 1716    Clinical Impression Statement  Pt with very slow progress toward goals. Pt can sit EOM for approximately 20 seconds at end of session.    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    Occupational performance deficits (Please refer to evaluation for details):  ADL's;IADL's;Rest and Sleep;Leisure;Social Participation;Work    Physicist, medical    Current Impairments/barriers affecting progress:  severity of deficits, very slow rate of recovery; pt with recent significant decline over past several months due medical complications    OT Frequency  2x /  week     OT Duration  8 weeks    OT Treatment/Interventions  Self-care/ADL training;Ultrasound;Moist Heat;Electrical Stimulation;DME and/or AE instruction;Neuromuscular education;Therapeutic exercise;Functional Mobility Training;Manual Therapy;Passive range of motion;Splinting;Therapeutic activities;Balance training;Patient/family education;Visual/perceptual remediation/compensation;Cognitive remediation/compensation    Plan   review splints from prior episodes, NMR trunk and LE's for transitional movements, static standing, sitting balance, sit to stand, stand to sit.     Consulted and Agree with Plan of Care  Patient;Family member/caregiver    Family Member Consulted  husband [name] and caregiver Leesa       Patient will benefit from skilled therapeutic intervention in order to improve the following deficits and impairments:  Decreased endurance, Decreased skin integrity, Impaired vision/preception, Improper body mechanics, Impaired perceived functional ability, Decreased knowledge of precautions, Cardiopulmonary status limiting activity, Decreased activity tolerance, Decreased knowledge of use of DME, Decreased strength, Impaired flexibility, Improper spinal/pelvic alignment, Impaired sensation, Difficulty walking, Decreased mobility, Decreased balance, Decreased cognition, Decreased range of motion, Impaired tone, Impaired UE functional use, Decreased safety awareness, Decreased coordination, Pain, Abnormal gait  Visit Diagnosis: Muscle weakness (generalized)  Abnormal posture  Unsteadiness on feet  Other symptoms and signs involving the nervous system  Stiffness of left elbow, not elsewhere classified  Frontal lobe and executive function deficit  Stiffness of left shoulder, not elsewhere classified  Stiffness of right shoulder, not elsewhere classified  Other abnormalities of gait and mobility  Spastic tetraplegia (HCC)    Problem List Patient Active Problem List   Diagnosis Date Noted   . Coagulase negative Staphylococcus bacteremia 02/09/2017  . Abdominal distension   . Chronic mucus hypersecretion, respiratory 12/31/2016  . Severe protein-calorie malnutrition [name]: less than 60% of standard weight) ([place]) 12/31/2016  . Acute urinary retention 12/31/2016  . Constipation 12/31/2016  . Shortness of breath   . Respiratory distress   . Pneumonia 12/05/2016  . Acute respiratory failure ([place]) 12/05/2016  . Pseudomonas urinary tract infection 10/27/2016  . Severe muscle deconditioning 09/30/2016  . Pressure injury of skin 09/28/2016  . Respiratory failure ([place])   . Tracheobronchitis   . Urinary retention   . PEG (percutaneous endoscopic gastrostomy) status ([place])   . Benign essential HTN   . Hyponatremia   . Hypokalemia   . Reactive hypertension   . Respiratory insufficiency 09/24/2016  . Malignant neoplasm of upper-inner quadrant of left breast in female, estrogen receptor positive ([place]) 07/14/2016  . Heterotopic ossification of bone 03/10/2016  . Acute on chronic respiratory failure with hypoxia ([place])   . Acute hypoxemic respiratory failure ([place])   . Acute blood loss anemia   . Atelectasis 02/04/2016  . Debility 02/03/2016  . Pneumonia of both lower lobes due to Pseudomonas species ([place])   . Pneumonia of both lower lobes due to methicillin susceptible Staphylococcus aureus (MSSA) ([place])   . Acute respiratory failure with hypoxia ([place])   . Acute encephalopathy   . Seizures ([place])   . Sepsis ([place])   . Hypoxia   . Pain   . Chronic respiratory failure ([place]) 12/28/2015  . Acute tracheobronchitis 12/24/2015  . Tracheostomy tube present ([place])   . Tracheal stenosis   . Chronic respiratory failure with hypoxia ([place])   . Spastic tetraplegia ([place]) 10/20/2015  . Tracheostomy status ([place]) 09/26/2015  . Allergic rhinitis 09/26/2015  . 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 Movement disorder 09/22/2015  . Encephalitis 09/22/2015  . Chest pain 02/07/2014  .  Premature atrial contractions 01/13/2014  . Abnormality of gait 12/04/2013  . Hyperlipidemia 12/21/2011  . Cardiovascular  risk factor 12/21/2011  . Bunion 01/31/2008  . Metatarsalgia of both feet 09/20/2007  . FLAT FOOT 09/20/2007  . HALLUX RIGIDUS, ACQUIRED 09/20/2007    Quay Burow, OTR/L 05/30/2017, 5:18 PM  [facility] [address] [place], [place], [address] Phone: [phone]   Fax:  [phone]  Name: Judy Spears MRN: [id number] Date of Birth: [birth year]

## 2017-05-31 ENCOUNTER — Telehealth: Payer: Self-pay | Admitting: Pulmonary Disease

## 2017-05-31 NOTE — Telephone Encounter (Signed)
Called the trach clinic and spoke with [name] Pt has not been seen in the trach clinic but she will reach out to Marni Griffon to see if he can help patient  Called spoke with patient's spouse Juanda Crumble - informed him of the above Juanda Crumble voiced his understanding Nothing further needed; will sign off

## 2017-05-31 NOTE — Therapy (Signed)
[facility] [address] [place], [place], [address] Phone: [phone]   Fax:  [phone]  Physical Therapy Treatment  Patient Details  Name: Judy Spears MRN: [id number] Date of Birth: July 27, 1950 Referring Provider: Dr. Levada Schilling   Encounter Date: 05/30/2017  PT End of Session - 05/30/17 1107    Visit Number  4    Number of Visits  17    Date for PT Re-Evaluation  07/09/17    Authorization Type  [employer] Medicare    Authorization Time Period  05-10-17 - 08-08-17    PT Start Time  1104    PT Stop Time  1145    PT Time Calculation (min)  41 min    Activity Tolerance  Patient tolerated treatment well    Behavior During Therapy  [facility] for tasks assessed/performed       Past Medical History:  Diagnosis Date  . Arthritis    lt great toe  . Complication of anesthesia    pt has had headaches post op-did advise to hydra well preop  . Hair loss    right-sided  . History of exercise stress test    a. ETT (12/15) with 12:00 exercise, no ST changes, occasional PACs.   . Hyperlipidemia   . Insomnia   . Migraine headache   . Movement disorder    resltess in left legs  . Postmenopausal   . 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 Premature atrial contractions    a. Holter (12/15) with 8% PACs, no atrial runs or atrial fibrillation.     Past Surgical History:  Procedure Laterality Date  . BREAST BIOPSY  01/01/2011   Procedure: BREAST BIOPSY WITH NEEDLE LOCALIZATION;  Surgeon: Rolm Bookbinder, MD;  Location: [facility];  Service: General;  Laterality: Left;  left breast wire localization biopsy  . BREAST BIOPSY Right 07/27/2012  . BREAST BIOPSY Right 07/07/2016  . BREAST BIOPSY Left 06/30/2016  . cataracts right eye    . CESAREAN SECTION  1986  . HERNIA REPAIR  2000   RIH  . INTRAMEDULLARY (IM) NAIL INTERTROCHANTERIC Left 01/30/2017   Procedure: ORIF AFFIXUS NAIL;  Surgeon: Paralee Cancel, MD;  Location:  [facility];  Service: Orthopedics;  Laterality: Left;  . IR REPLC GASTRO/COLONIC TUBE PERCUT W/FLUORO  09/29/2016  . opirectinal membrane peel    . PEG PLACEMENT    . RHINOPLASTY  1983  . TRACHEOSTOMY    . TRACHEOSTOMY TUBE PLACEMENT N/A 12/15/2016   Procedure: TRACHEOSTOMY CHANGE;  Surgeon: [name] Montane, MD;  Location: [facility];  Service: ENT;  Laterality: N/A;    There were no vitals filed for this visit.  Subjective Assessment - 05/30/17 1105    Subjective  Saw Dr. Brigitte Pulse on Friday and family insisted on x-ray of chest to her shortness of breath. Came back postive for PNA. She is now on levequin for this, 7 day course. No falls.     Patient is accompained by:  Family member spouse, [name] and caregiver, [name]    Pertinent History  [place]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  No/denies    Pain Score  0-No pain         [facility] Adult PT Treatment/Exercise - 05/30/17 1108      Transfers   Transfers  Sit to Stand;Stand to [employer]  Sit to Stand  4: Min assist;3: Mod assist    Sit to Stand Details  Tactile cues for sequencing;Tactile cues for posture;Tactile cues for weight shifting;Verbal cues for sequencing;Verbal cues for technique;Manual facilitation for weight shifting;Manual facilitation for placement;Manual facilitation for weight bearing    Sit to Stand Details (indicate cue type and reason)  from wheelchair x1, at mat edge multiple times. cues for base of support and weight shifting    Stand to Sit  4: Min assist;3: Mod assist;With upper extremity assist;To bed    Stand to Sit Details (indicate cue type and reason)  Tactile cues for sequencing;Tactile cues for weight shifting;Tactile cues for posture;Tactile cues for placement;Verbal cues for sequencing;Verbal cues for technique;Manual facilitation for weight shifting;Manual facilitation for placement;Manual facilitation for  weight bearing    Stand to Sit Details  cues for hip/knee flexion and slow, controlled descent with sitting down to mat table with each rep      Neuro Re-ed    Neuro Re-ed Details   seated edge of mat: emphasized posture, then worked on ant/post weight shifitng to assist with posture and midline sitting with min assist; standing next to mat table: worked on obtaining as much tall posture as able, then side stepping left<>right. cues/facilitiation needed for posture, weight shifting onto stance leg and to advance other LE sideways (min assist needed for LE advancement), standing in one place- worked on tall posture, then with one knee blocked worked on fwd/bwd stepping of other LE. assist/facilitation needed for blocking stance leg, moving other LE and for posture.                            Knee/Hip Exercises: Stretches   Passive Hamstring Stretch  Both;2 reps;60 seconds      Knee/Hip Exercises: Supine   Short Arc Quad Sets  AAROM;Strengthening;Both;1 set;10 reps;Limitations    Short Arc Quad Sets Limitations  cues/assist needed to slow pt's movements down for improved muscle activation                Bridges  AROM;Strengthening;Both;1 set;10 reps;Limitations    Bridges Limitations  assist needed to stabilized LE's with cues to slow down with movements    Straight Leg Raises  AAROM;Strengthening;Both;1 set;10 reps;Limitations    Straight Leg Raises Limitations  assist needed to slow movements down for more effiecient muscle activation              Other Supine Knee/Hip Exercises  bil hip abduction x 10 reps min AAROM for slow, controlled movements and to maintain foot in neurtral position with ex.           PT Short Term Goals - 05/11/17 2113      PT SHORT TERM GOAL #1   Title  Perform sit to/from stand from wheelchair or mat with +1 mod assist.    Baseline  max to total assist on 05-11-17    Time  4    Period  Weeks    Status  New    Target Date  06/09/17      PT SHORT TERM GOAL #2    Title  Pt will perform sit to stand from wheelchair to RW with max assist for pre-gait skill.    Baseline  max to total assist +2 hand held assist - 05-10-17    Time  4    Period  Weeks    Status  New    Target Date  06/09/17      PT SHORT TERM GOAL #3   Title  Pt will transfer supine to sit with max assist.    Baseline  total assist on 05-10-17    Time  4    Period  Weeks    Status  New    Target Date  06/09/17      PT SHORT TERM GOAL #4   Title  Pt will sit with RUE support with min to mod assist unsupported on side of mat for at least 3" to demo improved trunk strength and core stabilization.    Baseline  performance varies - pt able to sit with RUE support for approx. 1" with CGA to max assist with LOB     Time  4    Period  Weeks    Status  New    Target Date  06/09/17      PT SHORT TERM GOAL #5   Title  Pt will amb. 61' with appropriate assistive device with max assist +1    Baseline  total assist +2 -    05-10-17    Time  4    Period  Weeks    Status  New    Target Date  06/09/17        PT Long Term Goals - 05/11/17 2122      PT LONG TERM GOAL #1   Title  Pt will perform supine rolling R and L with min assist  to indicate improved independence with bed mobility.      Baseline  dependent for all bed mobility per spouse's report - 05-10-17    Time  8    Period  Weeks    Status  New    Target Date  07/09/17      PT LONG TERM GOAL #2   Title  Pt will sit on side of mat with RUE support for at least 5" with SBA     Baseline  max assist to min assist - ability varies    Time  8    Period  Weeks    Status  New    Target Date  07/09/17      PT LONG TERM GOAL #3   Title  Pt will amb. 20' with appropriate assistive device with +1 max assist.    Baseline  +2 max - total assist- pt unable to advance LE's - feet not clearing due to pt not wearing AFO's - 05-10-17    Time  8    Period  Weeks    Status  New    Target Date  07/09/17      PT LONG TERM GOAL #4   Title   Pt will perform basic transfers with min to mod assist to indicate improved functional independence.      Baseline  max assist - 05-10-17    Time  8    Period  Weeks    Target Date  07/09/17      PT LONG TERM GOAL #5   Title  Pt will perform static standing x 3" with bil. UE support with mod assist.      Time  8    Period  Weeks    Status  New    Target Date  07/09/17      Additional Long Term Goals   Additional Long Term Goals  Yes      PT LONG TERM GOAL #6   Title  Pt will be  able to return to participation in therapeutic horseback riding at [facility] for ongoing exercise program.    Time  8    Period  Weeks    Status  New    Target Date  07/09/17          Plan - 05/30/17 1108    Clinical Impression Statement  Today's skilled session continued to address LE strengthening/stretching, balance and pre-gait activities. Pt needed overall min to mod assist for balance/mobility. Pt did become tearful after standing and performing fwd/bwd stepping at mat for 1st time this session. Denied pain, nodded "yes" to happy tears when asked if they were happy or sad tears. Pt should benefit from continued PT to progress toward unmet goals.                                         Rehab Potential  Fair    Clinical Impairments Affecting Rehab Potential  severity of deficits with previous extensive therapies    PT Frequency  2x / week    PT Duration  8 weeks    PT Treatment/Interventions  ADLs/Self Care Home Management;Therapeutic exercise;Therapeutic activities;Functional mobility training;Gait training;Balance training;Neuromuscular re-education;Patient/family education;Orthotic Fit/Training;Passive range of motion    PT Next Visit Plan  continue to work on LE strengthening in supine/sitting, ? try standing; continue to work on posture, standing balance and pre-gait activities    PT Home Exercise Plan  caregiver and husband have previously been instructed in ROM, sitting balance, and transfer  training    Consulted and Agree with Plan of Care  Patient       Patient will benefit from skilled therapeutic intervention in order to improve the following deficits and impairments:  Abnormal gait, Decreased activity tolerance, Decreased balance, Decreased cognition, Decreased coordination, Decreased mobility, Decreased range of motion, Decreased endurance, Hypomobility, Decreased strength, Impaired flexibility, Increased muscle spasms, Impaired UE functional use, Postural dysfunction, Pain  Visit Diagnosis: Muscle weakness (generalized)  Coagulase negative Staphylococcus bacteremia  Abnormal posture  Unsteadiness on feet  Other symptoms and signs involving the nervous system  Other abnormalities of gait and mobility  Other abnormal involuntary movements     Problem List Patient Active Problem List   Diagnosis Date Noted  . Coagulase negative Staphylococcus bacteremia 02/09/2017  . Abdominal distension   . Chronic mucus hypersecretion, respiratory 12/31/2016  . Severe protein-calorie malnutrition [name]: less than 60% of standard weight) ([place]) 12/31/2016  . Acute urinary retention 12/31/2016  . Constipation 12/31/2016  . Shortness of breath   . Respiratory distress   . Pneumonia 12/05/2016  . Acute respiratory failure ([place]) 12/05/2016  . Pseudomonas urinary tract infection 10/27/2016  . Severe muscle deconditioning 09/30/2016  . Pressure injury of skin 09/28/2016  . Respiratory failure ([place])   . Tracheobronchitis   . Urinary retention   . PEG (percutaneous endoscopic gastrostomy) status ([place])   . Benign essential HTN   . Hyponatremia   . Hypokalemia   . Reactive hypertension   . Respiratory insufficiency 09/24/2016  . Malignant neoplasm of upper-inner quadrant of left breast in female, estrogen receptor positive ([place]) 07/14/2016  . Heterotopic ossification of bone 03/10/2016  . Acute on chronic respiratory failure with hypoxia ([place])   . Acute hypoxemic  respiratory failure ([place])   . Acute blood loss anemia   . Atelectasis 02/04/2016  . Debility 02/03/2016  . Pneumonia of both lower lobes due to  Pseudomonas species ([place])   . Pneumonia of both lower lobes due to methicillin susceptible Staphylococcus aureus (MSSA) ([place])   . Acute respiratory failure with hypoxia ([place])   . Acute encephalopathy   . Seizures ([place])   . Sepsis ([place])   . Hypoxia   . Pain   . Chronic respiratory failure ([place]) 12/28/2015  . Acute tracheobronchitis 12/24/2015  . Tracheostomy tube present ([place])   . Tracheal stenosis   . Chronic respiratory failure with hypoxia ([place])   . Spastic tetraplegia ([place]) 10/20/2015  . Tracheostomy status ([place]) 09/26/2015  . Allergic rhinitis 09/26/2015  . 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 Movement disorder 09/22/2015  . Encephalitis 09/22/2015  . Chest pain 02/07/2014  . Premature atrial contractions 01/13/2014  . Abnormality of gait 12/04/2013  . Hyperlipidemia 12/21/2011  . Cardiovascular risk factor 12/21/2011  . Bunion 01/31/2008  . Metatarsalgia of both feet 09/20/2007  . FLAT FOOT 09/20/2007  . HALLUX RIGIDUS, ACQUIRED 09/20/2007    Spears Ora, PTA, [facility] [address] [place], [place] [address] [phone] 05/31/17, 2:21 PM   Name: Judy Spears MRN: [id number] Date of Birth: 1950/11/19

## 2017-06-01 ENCOUNTER — Ambulatory Visit: Payer: Medicare Other

## 2017-06-01 ENCOUNTER — Ambulatory Visit: Payer: Medicare Other | Admitting: Physical Therapy

## 2017-06-01 ENCOUNTER — Encounter: Payer: Self-pay | Admitting: Physical Therapy

## 2017-06-01 DIAGNOSIS — R471 Dysarthria and anarthria: Secondary | ICD-10-CM | POA: Diagnosis not present

## 2017-06-01 DIAGNOSIS — R2681 Unsteadiness on feet: Secondary | ICD-10-CM

## 2017-06-01 DIAGNOSIS — R2689 Other abnormalities of gait and mobility: Secondary | ICD-10-CM

## 2017-06-01 DIAGNOSIS — R293 Abnormal posture: Secondary | ICD-10-CM

## 2017-06-01 DIAGNOSIS — M6281 Muscle weakness (generalized): Secondary | ICD-10-CM

## 2017-06-01 DIAGNOSIS — R41841 Cognitive communication deficit: Secondary | ICD-10-CM

## 2017-06-01 DIAGNOSIS — R4701 Aphasia: Secondary | ICD-10-CM

## 2017-06-01 DIAGNOSIS — R1312 Dysphagia, oropharyngeal phase: Secondary | ICD-10-CM

## 2017-06-01 NOTE — Therapy (Signed)
[facility] [address] [place], [place], [address] Phone: [phone]   Fax:  [phone]  Speech Language Pathology Treatment  Patient Details  Name: Judy Spears MRN: [id number] Date of Birth: 1950-05-01 Referring Provider: Levada Schilling, MD   Encounter Date: 06/01/2017  End of Session - 06/01/17 1711    Visit Number  6    Number of Visits  17    Date for SLP Re-Evaluation  07/22/17       Past Medical History:  Diagnosis Date  . Arthritis    lt great toe  . Complication of anesthesia    pt has had headaches post op-did advise to hydra well preop  . Hair loss    right-sided  . History of exercise stress test    a. ETT (12/15) with 12:00 exercise, no ST changes, occasional PACs.   . Hyperlipidemia   . Insomnia   . Migraine headache   . Movement disorder    resltess in left legs  . Postmenopausal   . 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 Premature atrial contractions    a. Holter (12/15) with 8% PACs, no atrial runs or atrial fibrillation.     Past Surgical History:  Procedure Laterality Date  . BREAST BIOPSY  01/01/2011   Procedure: BREAST BIOPSY WITH NEEDLE LOCALIZATION;  Surgeon: Rolm Bookbinder, MD;  Location: [facility];  Service: General;  Laterality: Left;  left breast wire localization biopsy  . BREAST BIOPSY Right 07/27/2012  . BREAST BIOPSY Right 07/07/2016  . BREAST BIOPSY Left 06/30/2016  . cataracts right eye    . CESAREAN SECTION  1986  . HERNIA REPAIR  2000   RIH  . INTRAMEDULLARY (IM) NAIL INTERTROCHANTERIC Left 01/30/2017   Procedure: ORIF AFFIXUS NAIL;  Surgeon: Paralee Cancel, MD;  Location: [facility];  Service: Orthopedics;  Laterality: Left;  . IR REPLC GASTRO/COLONIC TUBE PERCUT W/FLUORO  09/29/2016  . opirectinal membrane peel    . PEG PLACEMENT    . RHINOPLASTY  1983  . TRACHEOSTOMY    . TRACHEOSTOMY TUBE PLACEMENT N/A 12/15/2016   Procedure: TRACHEOSTOMY CHANGE;   Surgeon: [name] Montane, MD;  Location: [facility];  Service: ENT;  Laterality: N/A;    There were no vitals filed for this visit.  Subjective Assessment - 06/01/17 1406    Subjective  Pt with high-pitched moaning in hallway prior to ST session.    Patient is accompained by:  Emmaline Kluver            ADULT SLP TREATMENT - 06/01/17 1406      General Information   Behavior/Cognition  Alert;Agitated;Cooperative;Distractible      Treatment Provided   Treatment provided  Cognitive-Linquistic      Cognitive-Linquistic Treatment   Treatment focused on  Dysarthria    Skilled Treatment  Aide ascertained pt wanted to communicate something to her due to pt mouthing words while moaning in hallway - unintelligible. Aide had pt verbalize one word at a time with occasional repeats and pt communicated "want to understand stroke" - pt had seen poster re: "Understanding Stroke" in hallway prior to [place] session. SLP reviewed intelligibility strategies with pt/family/aide ("big breath, shout, open your mouth"). Pt read family names with overall 20% intelligiiblity for SLP, 80% intelligibility for husband/aide (words were not visible to family/aide). SLP intelligibliity: 1 syllable 25%, 2-syllable 15%, 3 syllable 20%. For aide, intelligibilty was - 1-syllable:100%, 2-syllable: 70%, 3-syllable 66%; husband slightly less than aide. SLP figured frequency  of adequate voicing at aprox 40% - worse at end of session than at beginning. SLP worked specifically with alveolar placement for t,d,n as family names have many of these in them. Pt benfitted from placement of tongue depressor on alveolar ridge initially, then SLP faded to touching alveolar ridge. SLP trained aide to work with pt in this way. Pt req'd occasional cueing to look at/attend to SLP due to looking at blinds/wall during session today. SLP made comment with pt, husband, and aide in the room at the end of the session that pt would have chance for most benefit from ST if  pt's alertness was like it was today, for 2 to 3 weeks of ST sessions.     Assessment / Recommendations / Plan   Plan  Continue with current plan of care      Progression Toward Goals   Progression toward goals  Progressing toward goals       SLP Education - 06/01/17 1710    Education provided  Yes    Education Details  working with pt for alveolar lingual placement    Person(s) Educated  Patient;Caregiver(s)    Methods  Explanation;Demonstration;Verbal cues    Comprehension  Verbalized understanding;Returned demonstration;Need further instruction;Verbal cues required       SLP Short Term Goals - 06/01/17 1713      SLP SHORT TERM GOAL #1   Title  Pt will demonstrate dysarthria HEP to increase articulatory coordination for speech with usual mod A over 3 sessions    Time  3    Period  Weeks    Status  On-going      SLP SHORT TERM GOAL #2   Title  Pt will achieve phonation adequate for SLP intelligibility in imitated two-syllable words using intelligibility compensations 12/20 trials with usual min A in 3 sessions    Time  3    Period  Weeks    Status  On-going      SLP SHORT TERM GOAL #3   Title  Pt will imitate 2-syllable words or phrases with functional intelligibility for SLP in 5/15 trials over 3 sessions     Time  3    Period  Weeks    Status  On-going      SLP SHORT TERM GOAL #4   Title  Pt will sustain attention to therapy tasks for 10 minutes with multimodal cues in 5 sessions    Baseline  05-23-17    Time  3    Period  Weeks    Status  On-going      SLP SHORT TERM GOAL #5   Title  pt family will demo understanding of oral care regimen, including safe ingestion of water/ice chips over three sessions    Time  3    Period  Weeks    Status  On-going      SLP SHORT TERM GOAL #6   Title  pt will demo dysphagia HEP to improve management of secretions and safety with any possible POs    Time  3    Period  Weeks    Status  On-going      SLP SHORT TERM GOAL #7    Title  family/caregivers will demo understanding of pt swallowing precautions, for pt safety with POs, by verbal report     Time  3    Period  Weeks    Status  On-going       SLP Long Term Goals - 06/01/17 1714  SLP LONG TERM GOAL #1   Title  Pt will demo dysarthria  HEP to incr articulatory coordination for speech, with occasional mod A over three sessions    Time  7    Period  Weeks    Status  On-going      SLP LONG TERM GOAL #2   Title  pt will achieve phonation adequate for SLP intelligibility in imitated 3-syllable words/3-word phrases using intelligibility compenastions 15/20 trials    Time  7    Period  Weeks    Status  On-going      SLP LONG TERM GOAL #3   Title  Pt will imitate 2-syllable words or phrases with functional intelligibility for SLP in 8/15 trials over three sessions    Time  7    Period  Weeks    Status  On-going      SLP LONG TERM GOAL #4   Title  pt will sustain attention to therapy tasks for 10 minutes with multimodal cues in 10 sessions    Time  7    Period  Weeks    Status  On-going      SLP LONG TERM GOAL #5   Title  pt will demo dysphagia HEP to improve management of secretions and safety with possible POs    Time  7    Period  Weeks    Status  On-going       Plan - 06/01/17 1711    Clinical Impression Statement  Patient presents with ongoing deficits in speech intelligibility (dysarthria, reduced breath support), cognitive communication and swallowing. Pt was more alert and awake today than in previous 2 sessions. Pt also with minimal congestion today, did not require cues to clear throat, or to actively remove any congestion for vocalization. SLP reminded caregivers that POs only recommended if pt has had thorough oral care. See skilled intervention for details of voicing, functional speech intelligilibty for SLP and aide/family with pt reading 1-3 syllable family names. Recommend direct skilled ST to address pt's decline in communication  skills, and to monitor a HEP for dysphagia and dysarthria in order to maximize functional communication, secretion management/ swallowing safety, reduce caregiver burden and improve quality of life.    Speech Therapy Frequency  2x / week    Treatment/Interventions  Aspiration precaution training;Trials of upgraded texture/liquids;Compensatory strategies;Functional tasks;Patient/family education;Cueing hierarchy;Diet toleration management by SLP;Cognitive reorganization;Compensatory techniques;SLP instruction and feedback;Internal/external aids;Multimodal communcation approach;Pharyngeal strengthening exercises;Oral motor exercises    Potential to Achieve Goals  Fair    Potential Considerations  Ability to learn/carryover information;Co-morbidities;Severity of impairments;Cooperation/participation level;Previous level of function    Consulted and Agree with Plan of Care  Patient;Family member/caregiver    Family Member Consulted  Husband, [name]       Patient will benefit from skilled therapeutic intervention in order to improve the following deficits and impairments:   Dysarthria and anarthria  Cognitive communication deficit  Aphasia  Dysphagia, oropharyngeal phase    Problem List Patient Active Problem List   Diagnosis Date Noted  . Coagulase negative Staphylococcus bacteremia 02/09/2017  . Abdominal distension   . Chronic mucus hypersecretion, respiratory 12/31/2016  . Severe protein-calorie malnutrition [name]: less than 60% of standard weight) ([place]) 12/31/2016  . Acute urinary retention 12/31/2016  . Constipation 12/31/2016  . Shortness of breath   . Respiratory distress   . Pneumonia 12/05/2016  . Acute respiratory failure ([place]) 12/05/2016  . Pseudomonas urinary tract infection 10/27/2016  . Severe muscle deconditioning 09/30/2016  .  Pressure injury of skin 09/28/2016  . Respiratory failure ([place])   . Tracheobronchitis   . Urinary retention   . PEG (percutaneous endoscopic  gastrostomy) status ([place])   . Benign essential HTN   . Hyponatremia   . Hypokalemia   . Reactive hypertension   . Respiratory insufficiency 09/24/2016  . Malignant neoplasm of upper-inner quadrant of left breast in female, estrogen receptor positive ([place]) 07/14/2016  . Heterotopic ossification of bone 03/10/2016  . Acute on chronic respiratory failure with hypoxia ([place])   . Acute hypoxemic respiratory failure ([place])   . Acute blood loss anemia   . Atelectasis 02/04/2016  . Debility 02/03/2016  . Pneumonia of both lower lobes due to Pseudomonas species ([place])   . Pneumonia of both lower lobes due to methicillin susceptible Staphylococcus aureus (MSSA) ([place])   . Acute respiratory failure with hypoxia ([place])   . Acute encephalopathy   . Seizures ([place])   . Sepsis ([place])   . Hypoxia   . Pain   . Chronic respiratory failure ([place]) 12/28/2015  . Acute tracheobronchitis 12/24/2015  . Tracheostomy tube present ([place])   . Tracheal stenosis   . Chronic respiratory failure with hypoxia ([place])   . Spastic tetraplegia ([place]) 10/20/2015  . Tracheostomy status ([place]) 09/26/2015  . Allergic rhinitis 09/26/2015  . 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 Movement disorder 09/22/2015  . Encephalitis 09/22/2015  . Chest pain 02/07/2014  . Premature atrial contractions 01/13/2014  . Abnormality of gait 12/04/2013  . Hyperlipidemia 12/21/2011  . Cardiovascular risk factor 12/21/2011  . Bunion 01/31/2008  . Metatarsalgia of both feet 09/20/2007  . FLAT FOOT 09/20/2007  . HALLUX RIGIDUS, ACQUIRED 09/20/2007    [facility] ,[place], CCC-SLP  06/01/2017, 5:17 PM  [facility] [address] [place], [place], [address] Phone: [phone]   Fax:  [phone]   Name: Judy Spears MRN: [id number] Date of Birth: 03-30-1950

## 2017-06-02 ENCOUNTER — Ambulatory Visit: Payer: Medicare Other | Admitting: Occupational Therapy

## 2017-06-02 ENCOUNTER — Ambulatory Visit: Payer: Medicare Other | Admitting: Rehabilitation

## 2017-06-02 ENCOUNTER — Encounter: Payer: Self-pay | Admitting: Occupational Therapy

## 2017-06-02 DIAGNOSIS — R471 Dysarthria and anarthria: Secondary | ICD-10-CM | POA: Diagnosis not present

## 2017-06-02 DIAGNOSIS — M25622 Stiffness of left elbow, not elsewhere classified: Secondary | ICD-10-CM

## 2017-06-02 DIAGNOSIS — M25611 Stiffness of right shoulder, not elsewhere classified: Secondary | ICD-10-CM

## 2017-06-02 DIAGNOSIS — R29818 Other symptoms and signs involving the nervous system: Secondary | ICD-10-CM

## 2017-06-02 DIAGNOSIS — R41844 Frontal lobe and executive function deficit: Secondary | ICD-10-CM

## 2017-06-02 DIAGNOSIS — M25612 Stiffness of left shoulder, not elsewhere classified: Secondary | ICD-10-CM

## 2017-06-02 DIAGNOSIS — R2681 Unsteadiness on feet: Secondary | ICD-10-CM

## 2017-06-02 DIAGNOSIS — R293 Abnormal posture: Secondary | ICD-10-CM

## 2017-06-02 DIAGNOSIS — M6281 Muscle weakness (generalized): Secondary | ICD-10-CM

## 2017-06-02 DIAGNOSIS — R2689 Other abnormalities of gait and mobility: Secondary | ICD-10-CM

## 2017-06-02 NOTE — Therapy (Signed)
[facility] [address] [place], [place], [address] Phone: [phone]   Fax:  [phone]  Occupational Therapy Treatment  Patient Details  Name: Judy Spears MRN: [id number] Date of Birth: 10/03/1950 Referring Provider: Dr. Levada Schilling   Encounter Date: 06/02/2017  OT End of Session - 06/02/17 1647    Visit Number  6    Number of Visits  16    Date for OT Re-Evaluation  07/05/17    Authorization Type  medicare     Authorization Time Period  90 days    OT Start Time  1018    OT Stop Time  1100    OT Time Calculation (min)  42 min    Activity Tolerance  Patient tolerated treatment well       Past Medical History:  Diagnosis Date  . Arthritis    lt great toe  . Complication of anesthesia    pt has had headaches post op-did advise to hydra well preop  . Hair loss    right-sided  . History of exercise stress test    a. ETT (12/15) with 12:00 exercise, no ST changes, occasional PACs.   . Hyperlipidemia   . Insomnia   . Migraine headache   . Movement disorder    resltess in left legs  . Postmenopausal   . 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 Premature atrial contractions    a. Holter (12/15) with 8% PACs, no atrial runs or atrial fibrillation.     Past Surgical History:  Procedure Laterality Date  . BREAST BIOPSY  01/01/2011   Procedure: BREAST BIOPSY WITH NEEDLE LOCALIZATION;  Surgeon: Rolm Bookbinder, MD;  Location: [facility];  Service: General;  Laterality: Left;  left breast wire localization biopsy  . BREAST BIOPSY Right 07/27/2012  . BREAST BIOPSY Right 07/07/2016  . BREAST BIOPSY Left 06/30/2016  . cataracts right eye    . CESAREAN SECTION  1986  . HERNIA REPAIR  2000   RIH  . INTRAMEDULLARY (IM) NAIL INTERTROCHANTERIC Left 01/30/2017   Procedure: ORIF AFFIXUS NAIL;  Surgeon: Paralee Cancel, MD;  Location: [facility];  Service: Orthopedics;  Laterality: Left;  . IR REPLC  GASTRO/COLONIC TUBE PERCUT W/FLUORO  09/29/2016  . opirectinal membrane peel    . PEG PLACEMENT    . RHINOPLASTY  1983  . TRACHEOSTOMY    . TRACHEOSTOMY TUBE PLACEMENT N/A 12/15/2016   Procedure: TRACHEOSTOMY CHANGE;  Surgeon: [name] Montane, MD;  Location: [facility];  Service: ENT;  Laterality: N/A;    There were no vitals filed for this visit.  Subjective Assessment - 06/02/17 1024    Subjective   Yes my hip when asked if she had pain. (R hip)    Patient is accompained by:  Family member caregiver [name]    Pertinent History  see epic snapshot - ABI/encephalitis/hypoxia from virus; recent hospitalization due to MRSA PNA     Patient Stated Goals  Pt unable to state.     Currently in Pain?  Yes    Pain Score  5     Pain Location  Hip    Pain Orientation  Right    Pain Descriptors / Indicators  Sore    Pain Type  Acute pain    Pain Onset  Yesterday    Pain Frequency  Intermittent    Aggravating Factors   when I go to move    Pain Relieving Factors  sitting still, recommneded biofreeze or icy  hot.                     OT Treatments/Exercises (OP) - 06/02/17 0001      Neurological Re-education Exercises   Other Exercises 1  Neuro re ed to address active forward weight transition in sitting both in wheelchair and on mat - long discussion with caregiver to stress the importance at this point of the pt to initiate this movement vs full assist as well as to encourage pt to move forward at her trunk vs pulling wtih UE's. Pt remains max a for transfers as well as fowrard transition of weight.  Addressed trunk rotation and elongation of L side in sitting and pt actively participating. Pt often requires multiple cues and attempts befoer she can initiate or participate in activity. Pt remains max a for all functional mobility.  Addressed sit to stand with emphasis on improving postural alignment and finding midline with active upper trunk.  Pt able to sit EOM for approximately 45 seconds today.                 OT Short Term Goals - 06/02/17 1645      OT SHORT TERM GOAL #1   Title  Pt will be mod a for sit to stand from toilet in steady to reduce care giver burden. - 06/07/2017    Status  On-going      OT SHORT TERM GOAL #2   Title  Pt will sit on level surface with 1 UE support only in preparation for participation in basic self care tasks for at least 3 minutes consistently    Status  On-going      OT SHORT TERM GOAL #3   Title  Pt will maintain static standing balance with BUE support during ADL activity  with mod a for at least 3 minutes consistently    Status  On-going      OT SHORT TERM GOAL #4   Title  Pt will roll during LB dressing with max a x1 to reduce caregiver burden.     Status  On-going      OT SHORT TERM GOAL #5   Title  Pt will demonstrate ability to follow simple, functional commands in context at least 50% of the time.     Status  On-going        OT Long Term Goals - 06/02/17 1646      OT LONG TERM GOAL #1   Title  Pt will go from sit to stand from toilet with steady with min a to reduce caregiver burden - 07/05/2017    Status  On-going      OT LONG TERM GOAL #2   Title  Pt will sit on level surfaces for participation in basic self care tasks with no UE support for at least 5 minutes    Status  On-going      OT LONG TERM GOAL #3   Title  Pt will maintain static standing balance with BUE support duirng ADL activity with min a for at least 3 minutes    Status  On-going      OT LONG TERM GOAL #4   Title  Pt wil roll during LB dressing with mod a x1 to reduce caregiver burden.      Status  On-going            Plan - 06/02/17 1646    Clinical Impression Statement  Pt continues with very low and  inconsistent progress toward goals. Encouraging pt and caregivers to focus on pt's ability to initiate movement before caregivers assist.      Occupational Profile and client history currently impacting functional performance  12/12 diagnosed  with severe encephalitis due to [place] virus, s/p breast cancer with lumpectomy, ARF, R vocal cord paralysis, tracheal stenosis repair, peg and trach dependent.    Occupational performance deficits (Please refer to evaluation for details):  ADL's;IADL's;Rest and Sleep;Leisure;Social Participation;Work    Physicist, medical    Current Impairments/barriers affecting progress:  severity of deficits, very slow rate of recovery; pt with recent significant decline over past several months due medical complications    OT Frequency  2x / week    OT Duration  8 weeks    OT Treatment/Interventions  Self-care/ADL training;Ultrasound;Moist Heat;Electrical Stimulation;DME and/or AE instruction;Neuromuscular education;Therapeutic exercise;Functional Mobility Training;Manual Therapy;Passive range of motion;Splinting;Therapeutic activities;Balance training;Patient/family education;Visual/perceptual remediation/compensation;Cognitive remediation/compensation    Plan   review splints from prior episodes, NMR trunk and LE's for transitional movements, static standing, sitting balance, sit to stand, stand to sit.     Consulted and Agree with Plan of Care  Patient;Family member/caregiver    Family Member Consulted  husband [name] and caregiver Leesa       Patient will benefit from skilled therapeutic intervention in order to improve the following deficits and impairments:  Decreased endurance, Decreased skin integrity, Impaired vision/preception, Improper body mechanics, Impaired perceived functional ability, Decreased knowledge of precautions, Cardiopulmonary status limiting activity, Decreased activity tolerance, Decreased knowledge of use of DME, Decreased strength, Impaired flexibility, Improper spinal/pelvic alignment, Impaired sensation, Difficulty walking, Decreased mobility, Decreased balance, Decreased cognition, Decreased range of motion, Impaired tone, Impaired UE functional use, Decreased safety awareness,  Decreased coordination, Pain, Abnormal gait  Visit Diagnosis: Muscle weakness (generalized)  Abnormal posture  Unsteadiness on feet  Other symptoms and signs involving the nervous system  Other abnormalities of gait and mobility  Frontal lobe and executive function deficit  Stiffness of left elbow, not elsewhere classified  Stiffness of left shoulder, not elsewhere classified  Stiffness of right shoulder, not elsewhere classified    Problem List Patient Active Problem List   Diagnosis Date Noted  . Coagulase negative Staphylococcus bacteremia 02/09/2017  . Abdominal distension   . Chronic mucus hypersecretion, respiratory 12/31/2016  . Severe protein-calorie malnutrition [name]: less than 60% of standard weight) ([place]) 12/31/2016  . Acute urinary retention 12/31/2016  . Constipation 12/31/2016  . Shortness of breath   . Respiratory distress   . Pneumonia 12/05/2016  . Acute respiratory failure ([place]) 12/05/2016  . Pseudomonas urinary tract infection 10/27/2016  . Severe muscle deconditioning 09/30/2016  . Pressure injury of skin 09/28/2016  . Respiratory failure ([place])   . Tracheobronchitis   . Urinary retention   . PEG (percutaneous endoscopic gastrostomy) status ([place])   . Benign essential HTN   . Hyponatremia   . Hypokalemia   . Reactive hypertension   . Respiratory insufficiency 09/24/2016  . Malignant neoplasm of upper-inner quadrant of left breast in female, estrogen receptor positive ([place]) 07/14/2016  . Heterotopic ossification of bone 03/10/2016  . Acute on chronic respiratory failure with hypoxia ([place])   . Acute hypoxemic respiratory failure ([place])   . Acute blood loss anemia   . Atelectasis 02/04/2016  . Debility 02/03/2016  . Pneumonia of both lower lobes due to Pseudomonas species ([place])   . Pneumonia of both lower lobes due to methicillin susceptible Staphylococcus aureus (MSSA) ([place])   . Acute respiratory failure with  hypoxia ([place])   . Acute  encephalopathy   . Seizures ([place])   . Sepsis ([place])   . Hypoxia   . Pain   . Chronic respiratory failure ([place]) 12/28/2015  . Acute tracheobronchitis 12/24/2015  . Tracheostomy tube present ([place])   . Tracheal stenosis   . Chronic respiratory failure with hypoxia ([place])   . Spastic tetraplegia ([place]) 10/20/2015  . Tracheostomy status ([place]) 09/26/2015  . Allergic rhinitis 09/26/2015  . 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 Movement disorder 09/22/2015  . Encephalitis 09/22/2015  . Chest pain 02/07/2014  . Premature atrial contractions 01/13/2014  . Abnormality of gait 12/04/2013  . Hyperlipidemia 12/21/2011  . Cardiovascular risk factor 12/21/2011  . Bunion 01/31/2008  . Metatarsalgia of both feet 09/20/2007  . FLAT FOOT 09/20/2007  . HALLUX RIGIDUS, ACQUIRED 09/20/2007    Quay Burow, OTR/L 06/02/2017, 4:49 PM  [facility] [address] [place], [place], [address] Phone: [phone]   Fax:  [phone]  Name: Arica B[place]ac[place] MRN: [id number] Date of Birth: [birth year]

## 2017-06-03 NOTE — Therapy (Signed)
[facility] [name] [place], [place], [address] Phone: [phone]   Fax:  [phone]  Physical Therapy Treatment  Patient Details  Name: Judy Spears MRN: [id number] Date of Birth: Apr 14, 1950 Referring Provider: Dr. Levada Schilling   Encounter Date: 06/01/2017     06/01/17 1323  PT Visits / Re-Eval  Visit Number 5  Number of Visits 17  Date for PT Re-Evaluation 07/09/17  Authorization  Authorization Type [employer]  Authorization Time Period 05-10-17 - 08-08-17  PT Time Calculation  PT Start Time 1320  PT Stop Time 1400  PT Time Calculation (min) 40 min  PT - End of Session  Activity Tolerance Patient tolerated treatment well  Behavior During Therapy [facility] for tasks assessed/performed     Past Medical History:  Diagnosis Date  . Arthritis    lt great toe  . Complication of anesthesia    pt has had headaches post op-did advise to hydra well preop  . Hair loss    right-sided  . History of exercise stress test    a. ETT (12/15) with 12:00 exercise, no ST changes, occasional PACs.   . Hyperlipidemia   . Insomnia   . Migraine headache   . Movement disorder    resltess in left legs  . Postmenopausal   . 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 Premature atrial contractions    a. Holter (12/15) with 8% PACs, no atrial runs or atrial fibrillation.     Past Surgical History:  Procedure Laterality Date  . BREAST BIOPSY  01/01/2011   Procedure: BREAST BIOPSY WITH NEEDLE LOCALIZATION;  Surgeon: Rolm Bookbinder, MD;  Location: [facility];  Service: General;  Laterality: Left;  left breast wire localization biopsy  . BREAST BIOPSY Right 07/27/2012  . BREAST BIOPSY Right 07/07/2016  . BREAST BIOPSY Left 06/30/2016  . cataracts right eye    . CESAREAN SECTION  1986  . HERNIA REPAIR  2000   RIH  . INTRAMEDULLARY (IM) NAIL INTERTROCHANTERIC Left 01/30/2017   Procedure: ORIF AFFIXUS NAIL;   Surgeon: Paralee Cancel, MD;  Location: [facility];  Service: Orthopedics;  Laterality: Left;  . IR REPLC GASTRO/COLONIC TUBE PERCUT W/FLUORO  09/29/2016  . opirectinal membrane peel    . PEG PLACEMENT    . RHINOPLASTY  1983  . TRACHEOSTOMY    . TRACHEOSTOMY TUBE PLACEMENT N/A 12/15/2016   Procedure: TRACHEOSTOMY CHANGE;  Surgeon: [name] Montane, MD;  Location: [facility];  Service: ENT;  Laterality: N/A;    There were no vitals filed for this visit.     06/01/17 1322  Symptoms/Limitations  Subjective No new complaints. Continues to be on antibiotic till Friday.   Patient is accompained by: Family member (caregiver [name])  Pertinent History Pow[place] viral encephalitis; anoxi BI:  ORIF Lt hip 02-09-17;  Rt THR  04-12-17  Patient Stated Goals improve mobility; walk  Pain Assessment  Currently in Pain? No/denies  Pain Score 0        06/01/17 1324  Transfers  Transfers Sit to Stand;Stand to Sit;Stand Pivot Transfers  Sit to Stand 4: Min assist;3: Mod assist;With upper extremity assist;From bed;From chair/3-in-1  Sit to Stand Details Tactile cues for sequencing;Tactile cues for posture;Tactile cues for weight shifting;Verbal cues for sequencing;Verbal cues for technique;Manual facilitation for weight shifting;Manual facilitation for placement;Manual facilitation for weight bearing  Sit to Stand Details (indicate cue type and reason) mod assisit from wheelchair, min/mod at edge of mat table  Stand to Sit  4: Min assist;3: Mod assist;With upper extremity assist;To bed  Stand to Sit Details (indicate cue type and reason) Tactile cues for sequencing;Tactile cues for weight shifting;Tactile cues for posture;Tactile cues for placement;Verbal cues for sequencing;Verbal cues for technique;Manual facilitation for weight shifting;Manual facilitation for placement;Manual facilitation for weight bearing  Stand to Sit Details min/mod assist to sit slowly and controlled to both mat table and wheelchair   Stand Pivot  Transfers 2: Max assist;3: Mod assist  Stand Pivot Transfer Details (indicate cue type and reason) mat<>wheelchair mod assist with cues on sequencing. assist needed to advance right LE, pt able to self advance left LE   Neuro Re-ed   Neuro Re-ed Details  for balance/strengthening/muscle re-education: seated at edge of mat- elbow taps to mat and back up with assist for balance/cues on technqiue x 5-6 reps each way; standing next to mat table- worked on posture, weight shifting left<>right, progressing to fwd/bwd stepping with each foot. assist needed to advance right foot. side stepping along mat with min to mod assist for balance and to advance right LE along.   Knee/Hip Exercises: Stretches  Passive Hamstring Stretch Both;2 reps;60 seconds  Knee/Hip Exercises: Supine  Bridges AROM;Strengthening;Both;1 set;10 reps;Limitations;AAROM  Bridges Limitations assist needed to stabilized LE's with cues to slow down with movements  Straight Leg Raises AAROM;Strengthening;Both;1 set;10 reps;Limitations  Straight Leg Raises Limitations assist needed to slow movements down for more effiecient muscle activation            Other Supine Knee/Hip Exercises bil hip abduction x 10 reps min AAROM for slow, controlled movements and to maintain foot in neurtral position with ex.       PT Short Term Goals - 05/11/17 2113      PT SHORT TERM GOAL #1   Title  Perform sit to/from stand from wheelchair or mat with +1 mod assist.    Baseline  max to total assist on 05-11-17    Time  4    Period  Weeks    Status  New    Target Date  06/09/17      PT SHORT TERM GOAL #2   Title  Pt will perform sit to stand from wheelchair to RW with max assist for pre-gait skill.    Baseline  max to total assist +2 hand held assist - 05-10-17    Time  4    Period  Weeks    Status  New    Target Date  06/09/17      PT SHORT TERM GOAL #3   Title  Pt will transfer supine to sit with max assist.    Baseline  total assist on 05-10-17     Time  4    Period  Weeks    Status  New    Target Date  06/09/17      PT SHORT TERM GOAL #4   Title  Pt will sit with RUE support with min to mod assist unsupported on side of mat for at least 3" to demo improved trunk strength and core stabilization.    Baseline  performance varies - pt able to sit with RUE support for approx. 1" with CGA to max assist with LOB     Time  4    Period  Weeks    Status  New    Target Date  06/09/17      PT SHORT TERM GOAL #5   Title  Pt will amb. 68' with appropriate assistive device with  max assist +1    Baseline  total assist +2 -    05-10-17    Time  4    Period  Weeks    Status  New    Target Date  06/09/17        PT Long Term Goals - 05/11/17 2122      PT LONG TERM GOAL #1   Title  Pt will perform supine rolling R and L with min assist  to indicate improved independence with bed mobility.      Baseline  dependent for all bed mobility per spouse's report - 05-10-17    Time  8    Period  Weeks    Status  New    Target Date  07/09/17      PT LONG TERM GOAL #2   Title  Pt will sit on side of mat with RUE support for at least 5" with SBA     Baseline  max assist to min assist - ability varies    Time  8    Period  Weeks    Status  New    Target Date  07/09/17      PT LONG TERM GOAL #3   Title  Pt will amb. 20' with appropriate assistive device with +1 max assist.    Baseline  +2 max - total assist- pt unable to advance LE's - feet not clearing due to pt not wearing AFO's - 05-10-17    Time  8    Period  Weeks    Status  New    Target Date  07/09/17      PT LONG TERM GOAL #4   Title  Pt will perform basic transfers with min to mod assist to indicate improved functional independence.      Baseline  max assist - 05-10-17    Time  8    Period  Weeks    Target Date  07/09/17      PT LONG TERM GOAL #5   Title  Pt will perform static standing x 3" with bil. UE support with mod assist.      Time  8    Period  Weeks    Status  New     Target Date  07/09/17      Additional Long Term Goals   Additional Long Term Goals  Yes      PT LONG TERM GOAL #6   Title  Pt will be able to return to participation in therapeutic horseback riding at [place] for ongoing exercise program.    Time  8    Period  Weeks    Status  New    Target Date  07/09/17       06/01/17 1323  Plan  Clinical Impression Statement Today's skilled session continued to focus on LE stretching, strengthening, transfers and balance activities. Pt continues to need min to mod assist for balance. Slowly progressing toward goals and should benefit from continued PT to progress toward unmet goals.  Pt will benefit from skilled therapeutic intervention in order to improve on the following deficits Abnormal gait;Decreased activity tolerance;Decreased balance;Decreased cognition;Decreased coordination;Decreased mobility;Decreased range of motion;Decreased endurance;Hypomobility;Decreased strength;Impaired flexibility;Increased muscle spasms;Impaired UE functional use;Postural dysfunction;Pain  Rehab Potential Fair  Clinical Impairments Affecting Rehab Potential severity of deficits with previous extensive therapies  PT Frequency 2x / week  PT Duration 8 weeks  PT Treatment/Interventions ADLs/Self Care Home Management;Therapeutic exercise;Therapeutic activities;Functional mobility training;Gait training;Balance training;Neuromuscular re-education;Patient/family education;Orthotic Fit/Training;Passive range  of motion  PT Next Visit Plan continue to work on LE strengthening in [place], ? try standing; continue to work on posture, standing balance and pre-gait activities  PT Home Exercise Plan caregiver and husband have previously been instructed in ROM, sitting balance, and transfer training  Consulted and Agree with Plan of Care Patient       Patient will benefit from skilled therapeutic intervention in order to improve the following deficits and  impairments:  Abnormal gait, Decreased activity tolerance, Decreased balance, Decreased cognition, Decreased coordination, Decreased mobility, Decreased range of motion, Decreased endurance, Hypomobility, Decreased strength, Impaired flexibility, Increased muscle spasms, Impaired UE functional use, Postural dysfunction, Pain  Visit Diagnosis: Unsteadiness on feet  Abnormal posture  Other abnormalities of gait and mobility  Muscle weakness (generalized)     Problem List Patient Active Problem List   Diagnosis Date Noted  . Coagulase negative Staphylococcus bacteremia 02/09/2017  . Abdominal distension   . Chronic mucus hypersecretion, respiratory 12/31/2016  . Severe protein-calorie malnutrition [name]: less than 60% of standard weight) ([place]) 12/31/2016  . Acute urinary retention 12/31/2016  . Constipation 12/31/2016  . Shortness of breath   . Respiratory distress   . Pneumonia 12/05/2016  . Acute respiratory failure ([place]) 12/05/2016  . Pseudomonas urinary tract infection 10/27/2016  . Severe muscle deconditioning 09/30/2016  . Pressure injury of skin 09/28/2016  . Respiratory failure ([place])   . Tracheobronchitis   . Urinary retention   . PEG (percutaneous endoscopic gastrostomy) status ([place])   . Benign essential HTN   . Hyponatremia   . Hypokalemia   . Reactive hypertension   . Respiratory insufficiency 09/24/2016  . Malignant neoplasm of upper-inner quadrant of left breast in female, estrogen receptor positive ([place]) 07/14/2016  . Heterotopic ossification of bone 03/10/2016  . Acute on chronic respiratory failure with hypoxia ([place])   . Acute hypoxemic respiratory failure ([place])   . Acute blood loss anemia   . Atelectasis 02/04/2016  . Debility 02/03/2016  . Pneumonia of both lower lobes due to Pseudomonas species ([place])   . Pneumonia of both lower lobes due to methicillin susceptible Staphylococcus aureus (MSSA) ([place])   . Acute respiratory failure with hypoxia ([place])   .  Acute encephalopathy   . Seizures ([place])   . Sepsis ([place])   . Hypoxia   . Pain   . Chronic respiratory failure ([place]) 12/28/2015  . Acute tracheobronchitis 12/24/2015  . Tracheostomy tube present ([place])   . Tracheal stenosis   . Chronic respiratory failure with hypoxia ([name])   . Spastic tetraplegia ([place]) 10/20/2015  . Tracheostomy status ([place]) 09/26/2015  . Allergic rhinitis 09/26/2015  . 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 Movement disorder 09/22/2015  . Encephalitis 09/22/2015  . Chest pain 02/07/2014  . Premature atrial contractions 01/13/2014  . Abnormality of gait 12/04/2013  . Hyperlipidemia 12/21/2011  . Cardiovascular risk factor 12/21/2011  . Bunion 01/31/2008  . Metatarsalgia of both feet 09/20/2007  . FLAT FOOT 09/20/2007  . HALLUX RIGIDUS, ACQUIRED 09/20/2007    Willow Ora, PTA, [facility] [address] [place], [place] [address] [phone] 06/03/17, 9:53 PM   Name: Armandina Iman MRN: [id number] Date of Birth: 12-Dec-1950

## 2017-06-06 ENCOUNTER — Encounter: Payer: Self-pay | Admitting: Occupational Therapy

## 2017-06-06 ENCOUNTER — Encounter: Payer: Self-pay | Admitting: Physical Therapy

## 2017-06-06 ENCOUNTER — Ambulatory Visit: Payer: Medicare Other | Admitting: Physical Therapy

## 2017-06-06 ENCOUNTER — Ambulatory Visit: Payer: Medicare Other | Admitting: Occupational Therapy

## 2017-06-06 DIAGNOSIS — R471 Dysarthria and anarthria: Secondary | ICD-10-CM | POA: Diagnosis not present

## 2017-06-06 DIAGNOSIS — R29818 Other symptoms and signs involving the nervous system: Secondary | ICD-10-CM

## 2017-06-06 DIAGNOSIS — M25612 Stiffness of left shoulder, not elsewhere classified: Secondary | ICD-10-CM

## 2017-06-06 DIAGNOSIS — R293 Abnormal posture: Secondary | ICD-10-CM

## 2017-06-06 DIAGNOSIS — M25611 Stiffness of right shoulder, not elsewhere classified: Secondary | ICD-10-CM

## 2017-06-06 DIAGNOSIS — R2681 Unsteadiness on feet: Secondary | ICD-10-CM

## 2017-06-06 DIAGNOSIS — M6281 Muscle weakness (generalized): Secondary | ICD-10-CM

## 2017-06-06 DIAGNOSIS — M25622 Stiffness of left elbow, not elsewhere classified: Secondary | ICD-10-CM

## 2017-06-06 DIAGNOSIS — R41844 Frontal lobe and executive function deficit: Secondary | ICD-10-CM

## 2017-06-06 DIAGNOSIS — R2689 Other abnormalities of gait and mobility: Secondary | ICD-10-CM

## 2017-06-06 NOTE — Therapy (Signed)
[facility] [address] [place], [place], [address] Phone: [phone]   Fax:  [phone]  Occupational Therapy Treatment  Patient Details  Name: Judy Spears MRN: [id number] Date of Birth: [birth year] Referring Provider: Dr. Levada Schilling   Encounter Date: 06/06/2017  OT End of Session - 06/06/17 1225    Visit Number  7    Number of Visits  16    Date for OT Re-Evaluation  07/05/17    Authorization Type  medicare     Authorization Time Period  90 days    OT Start Time  1102    OT Stop Time  1145    OT Time Calculation (min)  43 min    Activity Tolerance  Patient tolerated treatment well       Past Medical History:  Diagnosis Date  . Arthritis    lt great toe  . Complication of anesthesia    pt has had headaches post op-did advise to hydra well preop  . Hair loss    right-sided  . History of exercise stress test    a. ETT (12/15) with 12:00 exercise, no ST changes, occasional PACs.   . Hyperlipidemia   . Insomnia   . Migraine headache   . Movement disorder    resltess in left legs  . Postmenopausal   . 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 Premature atrial contractions    a. Holter (12/15) with 8% PACs, no atrial runs or atrial fibrillation.     Past Surgical History:  Procedure Laterality Date  . BREAST BIOPSY  01/01/2011   Procedure: BREAST BIOPSY WITH NEEDLE LOCALIZATION;  Surgeon: Rolm Bookbinder, MD;  Location: [facility];  Service: General;  Laterality: Left;  left breast wire localization biopsy  . BREAST BIOPSY Right 07/27/2012  . BREAST BIOPSY Right 07/07/2016  . BREAST BIOPSY Left 06/30/2016  . cataracts right eye    . CESAREAN SECTION  1986  . HERNIA REPAIR  2000   RIH  . INTRAMEDULLARY (IM) NAIL INTERTROCHANTERIC Left 01/30/2017   Procedure: ORIF AFFIXUS NAIL;  Surgeon: Paralee Cancel, MD;  Location: [facility];  Service: Orthopedics;  Laterality: Left;  . IR REPLC  GASTRO/COLONIC TUBE PERCUT W/FLUORO  09/29/2016  . opirectinal membrane peel    . PEG PLACEMENT    . RHINOPLASTY  1983  . TRACHEOSTOMY    . TRACHEOSTOMY TUBE PLACEMENT N/A 12/15/2016   Procedure: TRACHEOSTOMY CHANGE;  Surgeon: [name] Montane, MD;  Location: [facility];  Service: ENT;  Laterality: N/A;    There were no vitals filed for this visit.  Subjective Assessment - 06/06/17 1215    Subjective   Pt nodding yes when asked if she was ready to stand    Patient is accompained by:  Family member husband [name] and caregiver Leesa    Pertinent History  see epic snapshot - ABI/encephalitis/hypoxia from virus; recent hospitalization due to MRSA PNA     Patient Stated Goals  Pt unable to state.     Currently in Pain?  No/denies                   OT Treatments/Exercises (OP) - 06/06/17 0001      Neurological Re-education Exercises   Other Exercises 1  Neuro re ed with pt to address active elongation and rotation of L side of trunk after manual therapy. Also addressed expansion of rib cage and pt able to take breath in and breathe out during facilitation.  Transitioned into sitting and addressed pt's ability to initiated forward leaning from seated position without pulling with arms. Instructed caregiver and husband in simple activity that could be done from wheelchair to begin to focus on pt's ability to use core muscles for fowrard leaning as well as co contraction of flexors and extrensors for controlled upright sitting vs tone.  Both able to verbalize understanding.  Pt today able to initiate movement when asked approximately 70% of the time.  Progressed to sit to stand, standing with emphasis on postural alignment and increasing pt's participation in achieving better alignment as well as actively participating in staying up against gravity.  Also addressed active stand to sit - pt able to complete with mod assist and facilitation.       Manual Therapy   Manual Therapy  Soft tissue  mobilization;Myofascial release    Manual therapy comments  soft tissue and myofascial release approach used for trunk to elongate and rotate L side of trunk.  Caregiver insructed in stretch and able to return demonstrate after practice.               OT Education - 06/06/17 1221    Education provided  Yes    Education Details  stretching and active initiation for forward leaning without pulling with hands.     Person(s) Educated  Patient;Caregiver(s)    Methods  Explanation;Demonstration    Comprehension  Verbalized understanding;Returned demonstration       OT Short Term Goals - 06/06/17 1224      OT SHORT TERM GOAL #1   Title  Pt will be mod a for sit to stand from toilet in steady to reduce care giver burden. - 06/07/2017    Status  On-going      OT SHORT TERM GOAL #2   Title  Pt will sit on level surface with 1 UE support only in preparation for participation in basic self care tasks for at least 3 minutes consistently    Status  On-going      OT SHORT TERM GOAL #3   Title  Pt will maintain static standing balance with BUE support during ADL activity  with mod a for at least 3 minutes consistently    Status  On-going      OT SHORT TERM GOAL #4   Title  Pt will roll during LB dressing with max a x1 to reduce caregiver burden.     Status  On-going      OT SHORT TERM GOAL #5   Title  Pt will demonstrate ability to follow simple, functional commands in context at least 50% of the time.     Status  Achieved        OT Long Term Goals - 06/06/17 1224      OT LONG TERM GOAL #1   Title  Pt will go from sit to stand from toilet with steady with min a to reduce caregiver burden - 07/05/2017    Status  On-going      OT LONG TERM GOAL #2   Title  Pt will sit on level surfaces for participation in basic self care tasks with no UE support for at least 5 minutes    Status  On-going      OT LONG TERM GOAL #3   Title  Pt will maintain static standing balance with BUE support  duirng ADL activity with min a for at least 3 minutes    Status  On-going  OT LONG TERM GOAL #4   Title  Pt wil roll during LB dressing with mod a x1 to reduce caregiver burden.      Status  On-going            Plan - 06/06/17 1224    Clinical Impression Statement  Pt with improving ability to actively particpate in therapy however has met only 1 STG's.  Will continue to work toward [employer].     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    Occupational performance deficits (Please refer to evaluation for details):  ADL's;IADL's;Rest and Sleep;Leisure;Social Participation;Work    Current Impairments/barriers affecting progress:  severity of deficits, very slow rate of recovery; pt with recent significant decline over past several months due medical complications    OT Frequency  2x / week    OT Duration  8 weeks    OT Treatment/Interventions  Self-care/ADL training;Ultrasound;Moist Heat;Electrical Stimulation;DME and/or AE instruction;Neuromuscular education;Therapeutic exercise;Functional Mobility Training;Manual Therapy;Passive range of motion;Splinting;Therapeutic activities;Balance training;Patient/family education;Visual/perceptual remediation/compensation;Cognitive remediation/compensation    Plan   review splints from prior episodes, NMR trunk and LE's for transitional movements, static standing, sitting balance, sit to stand, stand to sit.     Consulted and Agree with Plan of Care  Patient;Family member/caregiver    Family Member Consulted  husband [name] and caregiver Leesa       Patient will benefit from skilled therapeutic intervention in order to improve the following deficits and impairments:     Visit Diagnosis: Muscle weakness (generalized)  Abnormal  posture  Unsteadiness on feet  Other symptoms and signs involving the nervous system  Other abnormalities of gait and mobility  Frontal lobe and executive function deficit  Stiffness of left elbow, not elsewhere classified  Stiffness of left shoulder, not elsewhere classified  Stiffness of right shoulder, not elsewhere classified    Problem List Patient Active Problem List   Diagnosis Date Noted  . Coagulase negative Staphylococcus bacteremia 02/09/2017  . Abdominal distension   . Chronic mucus hypersecretion, respiratory 12/31/2016  . Severe protein-calorie malnutrition [name]: less than 60% of standard weight) ([place]) 12/31/2016  . Acute urinary retention 12/31/2016  . Constipation 12/31/2016  . Shortness of breath   . Respiratory distress   . Pneumonia 12/05/2016  . Acute respiratory failure ([place]) 12/05/2016  . Pseudomonas urinary tract infection 10/27/2016  . Severe muscle deconditioning 09/30/2016  . Pressure injury of skin 09/28/2016  . Respiratory failure ([place])   . Tracheobronchitis   . Urinary retention   . PEG (percutaneous endoscopic gastrostomy) status ([place])   . Benign essential HTN   . Hyponatremia   . Hypokalemia   . Reactive hypertension   . Respiratory insufficiency 09/24/2016  . Malignant neoplasm of upper-inner quadrant of left breast in female, estrogen receptor positive ([place]) 07/14/2016  . Heterotopic ossification of bone 03/10/2016  . Acute on chronic respiratory failure with hypoxia ([place])   . Acute hypoxemic respiratory failure ([place])   . Acute blood loss anemia   . Atelectasis 02/04/2016  . Debility 02/03/2016  . Pneumonia of both lower lobes due to Pseudomonas species ([place])   . Pneumonia of both lower lobes due to methicillin susceptible Staphylococcus aureus (MSSA) ([place])   . Acute respiratory failure with hypoxia ([place])   . Acute encephalopathy   . Seizures ([place])   . Sepsis ([place])   .  Hypoxia   . Pain   . Chronic respiratory failure ([place])  12/28/2015  . Acute tracheobronchitis 12/24/2015  . Tracheostomy tube present ([place])   . Tracheal stenosis   . Chronic respiratory failure with hypoxia ([place])   . Spastic tetraplegia ([place]) 10/20/2015  . Tracheostomy status ([place]) 09/26/2015  . Allergic rhinitis 09/26/2015  . 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 Movement disorder 09/22/2015  . Encephalitis 09/22/2015  . Chest pain 02/07/2014  . Premature atrial contractions 01/13/2014  . Abnormality of gait 12/04/2013  . Hyperlipidemia 12/21/2011  . Cardiovascular risk factor 12/21/2011  . Bunion 01/31/2008  . Metatarsalgia of both feet 09/20/2007  . FLAT FOOT 09/20/2007  . HALLUX RIGIDUS, ACQUIRED 09/20/2007    Quay Burow, OTR/L 06/06/2017, 12:30 PM  [facility] [address] [place], [place], [address] Phone: [phone]   Fax:  [phone]  Name: Judy Spears MRN: [id number] Date of Birth: 1950/12/30

## 2017-06-06 NOTE — Therapy (Signed)
[facility] [address] [place], [place], [address] Phone: [phone]   Fax:  [phone]  Physical Therapy Treatment  Patient Details  Name: Judy Spears MRN: [id number] Date of Birth: [birth year] Referring Provider: Dr. Levada Schilling   Encounter Date: 06/06/2017  PT End of Session - 06/06/17 1022    Visit Number  6    Number of Visits  17    Date for PT Re-Evaluation  07/09/17    Authorization Type  [employer] Medicare    Authorization Time Period  05-10-17 - 08-08-17    PT Start Time  1019    PT Stop Time  1059    PT Time Calculation (min)  40 min    Activity Tolerance  Patient tolerated treatment well;Other (comment) did have brief moment of frustration with standing, calmed down wiith sitting and talking to [name]/[name]    Behavior During Therapy  [facility] for tasks assessed/performed       Past Medical History:  Diagnosis Date  . Arthritis    lt great toe  . Complication of anesthesia    pt has had headaches post op-did advise to hydra well preop  . Hair loss    right-sided  . History of exercise stress test    a. ETT (12/15) with 12:00 exercise, no ST changes, occasional PACs.   . Hyperlipidemia   . Insomnia   . Migraine headache   . Movement disorder    resltess in left legs  . Postmenopausal   . 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 Premature atrial contractions    a. Holter (12/15) with 8% PACs, no atrial runs or atrial fibrillation.     Past Surgical History:  Procedure Laterality Date  . BREAST BIOPSY  01/01/2011   Procedure: BREAST BIOPSY WITH NEEDLE LOCALIZATION;  Surgeon: Rolm Bookbinder, MD;  Location: [facility];  Service: General;  Laterality: Left;  left breast wire localization biopsy  . BREAST BIOPSY Right 07/27/2012  . BREAST BIOPSY Right 07/07/2016  . BREAST BIOPSY Left 06/30/2016  . cataracts right eye    . CESAREAN SECTION  1986  . HERNIA REPAIR  2000   RIH  .  INTRAMEDULLARY (IM) NAIL INTERTROCHANTERIC Left 01/30/2017   Procedure: ORIF AFFIXUS NAIL;  Surgeon: Paralee Cancel, MD;  Location: [facility];  Service: Orthopedics;  Laterality: Left;  . IR REPLC GASTRO/COLONIC TUBE PERCUT W/FLUORO  09/29/2016  . opirectinal membrane peel    . PEG PLACEMENT    . RHINOPLASTY  1983  . TRACHEOSTOMY    . TRACHEOSTOMY TUBE PLACEMENT N/A 12/15/2016   Procedure: TRACHEOSTOMY CHANGE;  Surgeon: [name] Montane, MD;  Location: [facility];  Service: ENT;  Laterality: N/A;    There were no vitals filed for this visit.  Subjective Assessment - 06/06/17 1021    Subjective  No new complaints. No falls or pain to report. Done with antibiotic and has a follow up chest xray later this week to ensure the PNA is cleared up.     Patient is accompained by:  Family member chip and lesa    Pertinent History  Powassen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  No/denies    Pain Score  0-No pain         06/06/17 1102  Transfers  Transfers Sit to Stand;Stand to Sit  Sit to Stand  4: Min assist;3: Mod assist;With upper extremity assist;From bed;From chair/3-in-1  Sit to Stand Details Tactile cues for sequencing;Tactile cues for posture;Tactile cues for weight shifting;Verbal cues for sequencing;Verbal cues for technique;Manual facilitation for weight shifting;Manual facilitation for placement;Manual facilitation for weight bearing  Stand to Sit 4: Min assist;3: Mod assist;With upper extremity assist;To bed  Stand to Sit Details (indicate cue type and reason) Tactile cues for sequencing;Tactile cues for weight shifting;Tactile cues for posture;Tactile cues for placement;Verbal cues for sequencing;Verbal cues for technique;Manual facilitation for weight shifting;Manual facilitation for placement;Manual facilitation for weight bearing  Stand Pivot Transfers 2: Max assist  Stand Pivot Transfer Details  (indicate cue type and reason) wheelchair to mat table. assist needed for posture, weight shifting and right LE advancment toward mat, pt able to self advance left LE  Neuro Re-ed   Neuro Re-ed Details  for balance/strengthening/muscle re-education: seated at edge of mat- elbow taps to mat and back up to tall posture x 5 each side with min to mod assist, cues on technique, ant/post weight shifting to facilitate increased fwd weight shifting to intiate power up through LE's with min assist, cues needed. standing next to mat table: worked on tall posture with facilitation at hips, shoulder and cues for knee/hip/trunk extension. once pt was as tall as possible worked on lateral weight shifting, progressing to fwd/bwd stepping in place. pt able to self advance left LE with min to mod assist for balance/posture and weight shifting onto right LE, assitance needed with fwd advacement of right LE, pt able to self advance bwd.                        Knee/Hip Exercises: Stretches  Passive Hamstring Stretch Both;2 reps;60 seconds  Passive Hamstring Stretch Limitations with blue wedge/pillows under trunk/head.  Knee/Hip Exercises: Supine  Bridges AROM;Strengthening;Both;1 set;10 reps;Limitations;AAROM  Bridges Limitations assist needed to stabilized LE's with cues for full hip lifting and to slow down with movements  Straight Leg Raises AAROM;Strengthening;Both;1 set;10 reps;Limitations  Straight Leg Raises Limitations assist needed to slow movements down for more effiecient muscle activation                  PT Short Term Goals - 05/11/17 2113      PT SHORT TERM GOAL #1   Title  Perform sit to/from stand from wheelchair or mat with +1 mod assist.    Baseline  max to total assist on 05-11-17    Time  4    Period  Weeks    Status  New    Target Date  06/09/17      PT SHORT TERM GOAL #2   Title  Pt will perform sit to stand from wheelchair to RW with max assist for pre-gait skill.    Baseline  max to  total assist +2 hand held assist - 05-10-17    Time  4    Period  Weeks    Status  New    Target Date  06/09/17      PT SHORT TERM GOAL #3   Title  Pt will transfer supine to sit with max assist.    Baseline  total assist on 05-10-17    Time  4    Period  Weeks    Status  New    Target Date  06/09/17      PT SHORT TERM GOAL #4   Title  Pt will sit with RUE support with min to  mod assist unsupported on side of mat for at least 3" to demo improved trunk strength and core stabilization.    Baseline  performance varies - pt able to sit with RUE support for approx. 1" with CGA to max assist with LOB     Time  4    Period  Weeks    Status  New    Target Date  06/09/17      PT SHORT TERM GOAL #5   Title  Pt will amb. 52' with appropriate assistive device with max assist +1    Baseline  total assist +2 -    05-10-17    Time  4    Period  Weeks    Status  New    Target Date  06/09/17        PT Long Term Goals - 05/11/17 2122      PT LONG TERM GOAL #1   Title  Pt will perform supine rolling R and L with min assist  to indicate improved independence with bed mobility.      Baseline  dependent for all bed mobility per spouse's report - 05-10-17    Time  8    Period  Weeks    Status  New    Target Date  07/09/17      PT LONG TERM GOAL #2   Title  Pt will sit on side of mat with RUE support for at least 5" with SBA     Baseline  max assist to min assist - ability varies    Time  8    Period  Weeks    Status  New    Target Date  07/09/17      PT LONG TERM GOAL #3   Title  Pt will amb. 20' with appropriate assistive device with +1 max assist.    Baseline  +2 max - total assist- pt unable to advance LE's - feet not clearing due to pt not wearing AFO's - 05-10-17    Time  8    Period  Weeks    Status  New    Target Date  07/09/17      PT LONG TERM GOAL #4   Title  Pt will perform basic transfers with min to mod assist to indicate improved functional independence.      Baseline   max assist - 05-10-17    Time  8    Period  Weeks    Target Date  07/09/17      PT LONG TERM GOAL #5   Title  Pt will perform static standing x 3" with bil. UE support with mod assist.      Time  8    Period  Weeks    Status  New    Target Date  07/09/17      Additional Long Term Goals   Additional Long Term Goals  Yes      PT LONG TERM GOAL #6   Title  Pt will be able to return to participation in therapeutic horseback riding at [facility] for ongoing exercise program.    Time  8    Period  Weeks    Status  New    Target Date  07/09/17         06/06/17 1022  Plan  Clinical Impression Statement Today's skilled session continued to focuse on LE stretching, strengthening, transfers and balance activities. Pt did become emotional after one stand needing [name] and [name]  to help calm her down. Pt expressed frustration over not being able to stand up tall. With encouragement from family/caregiver and their assistance with next 2 stands pt's tempermant improved. Pt continues to make slow, steady progress and should benefit from continued PT to progress toward unmet goals.   Pt will benefit from skilled therapeutic intervention in order to improve on the following deficits Abnormal gait;Decreased activity tolerance;Decreased balance;Decreased cognition;Decreased coordination;Decreased mobility;Decreased range of motion;Decreased endurance;Hypomobility;Decreased strength;Impaired flexibility;Increased muscle spasms;Impaired UE functional use;Postural dysfunction;Pain  Rehab Potential Fair  Clinical Impairments Affecting Rehab Potential severity of deficits with previous extensive therapies  PT Frequency 2x / week  PT Duration 8 weeks  PT Treatment/Interventions ADLs/Self Care Home Management;Therapeutic exercise;Therapeutic activities;Functional mobility training;Gait training;Balance training;Neuromuscular re-education;Patient/family education;Orthotic Fit/Training;Passive range of motion  PT  Next Visit Plan assess remaining STGs; continued to address transfers, standing balance and pre gait  PT Home Exercise Plan caregiver and husband have previously been instructed in ROM, sitting balance, and transfer training  Consulted and Agree with Plan of Care Patient      Patient will benefit from skilled therapeutic intervention in order to improve the following deficits and impairments:  Abnormal gait, Decreased activity tolerance, Decreased balance, Decreased cognition, Decreased coordination, Decreased mobility, Decreased range of motion, Decreased endurance, Hypomobility, Decreased strength, Impaired flexibility, Increased muscle spasms, Impaired UE functional use, Postural dysfunction, Pain  Visit Diagnosis: Muscle weakness (generalized)  Abnormal posture  Unsteadiness on feet  Other abnormalities of gait and mobility     Problem List Patient Active Problem List   Diagnosis Date Noted  . Coagulase negative Staphylococcus bacteremia 02/09/2017  . Abdominal distension   . Chronic mucus hypersecretion, respiratory 12/31/2016  . Severe protein-calorie malnutrition [name]: less than 60% of standard weight) ([place]) 12/31/2016  . Acute urinary retention 12/31/2016  . Constipation 12/31/2016  . Shortness of breath   . Respiratory distress   . Pneumonia 12/05/2016  . Acute respiratory failure ([place]) 12/05/2016  . Pseudomonas urinary tract infection 10/27/2016  . Severe muscle deconditioning 09/30/2016  . Pressure injury of skin 09/28/2016  . Respiratory failure ([place])   . Tracheobronchitis   . Urinary retention   . PEG (percutaneous endoscopic gastrostomy) status ([place])   . Benign essential HTN   . Hyponatremia   . Hypokalemia   . Reactive hypertension   . Respiratory insufficiency 09/24/2016  . Malignant neoplasm of upper-inner quadrant of left breast in female, estrogen receptor positive ([place]) 07/14/2016  . Heterotopic ossification of bone 03/10/2016  . Acute on chronic  respiratory failure with hypoxia ([place])   . Acute hypoxemic respiratory failure ([place])   . Acute blood loss anemia   . Atelectasis 02/04/2016  . Debility 02/03/2016  . Pneumonia of both lower lobes due to Pseudomonas species ([place])   . Pneumonia of both lower lobes due to methicillin susceptible Staphylococcus aureus (MSSA) ([place])   . Acute respiratory failure with hypoxia ([place])   . Acute encephalopathy   . Seizures ([place])   . Sepsis ([place])   . Hypoxia   . Pain   . Chronic respiratory failure ([place]) 12/28/2015  . Acute tracheobronchitis 12/24/2015  . Tracheostomy tube present ([place])   . Tracheal stenosis   . Chronic respiratory failure with hypoxia ([place])   . Spastic tetraplegia ([place]) 10/20/2015  . Tracheostomy status ([place]) 09/26/2015  . Allergic rhinitis 09/26/2015  . 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 Movement disorder 09/22/2015  . Encephalitis 09/22/2015  . Chest pain 02/07/2014  . Premature atrial contractions 01/13/2014  .  Abnormality of gait 12/04/2013  . Hyperlipidemia 12/21/2011  . Cardiovascular risk factor 12/21/2011  . Bunion 01/31/2008  . Metatarsalgia of both feet 09/20/2007  . FLAT FOOT 09/20/2007  . HALLUX RIGIDUS, ACQUIRED 09/20/2007    Willow Ora, PTA, [facility] [address] [place], [place] [address] [phone] 06/07/17, 9:32 PM   Name: Judy Spears MRN: [id number] Date of Birth: 02-08-1950

## 2017-06-09 ENCOUNTER — Encounter: Payer: Self-pay | Admitting: Occupational Therapy

## 2017-06-09 ENCOUNTER — Ambulatory Visit
Admission: RE | Admit: 2017-06-09 | Discharge: 2017-06-09 | Disposition: A | Discharge status: Home / Self Care | Payer: Medicare Other | Source: Ambulatory Visit | Attending: Internal Medicine | Admitting: Internal Medicine

## 2017-06-09 ENCOUNTER — Other Ambulatory Visit: Payer: Self-pay | Admitting: Internal Medicine

## 2017-06-09 ENCOUNTER — Ambulatory Visit: Payer: Medicare Other | Admitting: Physical Therapy

## 2017-06-09 ENCOUNTER — Ambulatory Visit: Payer: Medicare Other | Admitting: Occupational Therapy

## 2017-06-09 ENCOUNTER — Ambulatory Visit: Payer: Medicare Other

## 2017-06-09 DIAGNOSIS — Z09 Encounter for follow-up examination after completed treatment for conditions other than malignant neoplasm: Secondary | ICD-10-CM

## 2017-06-09 DIAGNOSIS — R29818 Other symptoms and signs involving the nervous system: Secondary | ICD-10-CM

## 2017-06-09 DIAGNOSIS — R2681 Unsteadiness on feet: Secondary | ICD-10-CM

## 2017-06-09 DIAGNOSIS — M25622 Stiffness of left elbow, not elsewhere classified: Secondary | ICD-10-CM

## 2017-06-09 DIAGNOSIS — R293 Abnormal posture: Secondary | ICD-10-CM

## 2017-06-09 DIAGNOSIS — R2689 Other abnormalities of gait and mobility: Secondary | ICD-10-CM

## 2017-06-09 DIAGNOSIS — R471 Dysarthria and anarthria: Secondary | ICD-10-CM

## 2017-06-09 DIAGNOSIS — G8250 Quadriplegia, unspecified: Secondary | ICD-10-CM

## 2017-06-09 DIAGNOSIS — M25612 Stiffness of left shoulder, not elsewhere classified: Secondary | ICD-10-CM

## 2017-06-09 DIAGNOSIS — M6281 Muscle weakness (generalized): Secondary | ICD-10-CM

## 2017-06-09 DIAGNOSIS — M25611 Stiffness of right shoulder, not elsewhere classified: Secondary | ICD-10-CM

## 2017-06-09 DIAGNOSIS — R4701 Aphasia: Secondary | ICD-10-CM

## 2017-06-09 DIAGNOSIS — R1312 Dysphagia, oropharyngeal phase: Secondary | ICD-10-CM

## 2017-06-09 DIAGNOSIS — R41841 Cognitive communication deficit: Secondary | ICD-10-CM

## 2017-06-09 DIAGNOSIS — R41844 Frontal lobe and executive function deficit: Secondary | ICD-10-CM

## 2017-06-09 NOTE — Therapy (Signed)
[facility] [address] [place], [place], [address] Phone: [phone]   Fax:  [phone]  Occupational Therapy Treatment  Patient Details  Name: Judy Spears MRN: [id number] Date of Birth: Jan 29, 1950 Referring Provider: Dr. Levada Schilling   Encounter Date: 06/09/2017  OT End of Session - 06/09/17 1158    Visit Number  8    Number of Visits  16    Date for OT Re-Evaluation  07/05/17    Authorization Type  medicare     Authorization Time Period  90 days    OT Start Time  1102    OT Stop Time  1145    OT Time Calculation (min)  43 min    Activity Tolerance  Patient tolerated treatment well       Past Medical History:  Diagnosis Date  . Arthritis    lt great toe  . Complication of anesthesia    pt has had headaches post op-did advise to hydra well preop  . Hair loss    right-sided  . History of exercise stress test    a. ETT (12/15) with 12:00 exercise, no ST changes, occasional PACs.   . Hyperlipidemia   . Insomnia   . Migraine headache   . Movement disorder    resltess in left legs  . Postmenopausal   . 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 Premature atrial contractions    a. Holter (12/15) with 8% PACs, no atrial runs or atrial fibrillation.     Past Surgical History:  Procedure Laterality Date  . BREAST BIOPSY  01/01/2011   Procedure: BREAST BIOPSY WITH NEEDLE LOCALIZATION;  Surgeon: Rolm Bookbinder, MD;  Location: [facility];  Service: General;  Laterality: Left;  left breast wire localization biopsy  . BREAST BIOPSY Right 07/27/2012  . BREAST BIOPSY Right 07/07/2016  . BREAST BIOPSY Left 06/30/2016  . cataracts right eye    . CESAREAN SECTION  1986  . HERNIA REPAIR  2000   RIH  . INTRAMEDULLARY (IM) NAIL INTERTROCHANTERIC Left 01/30/2017   Procedure: ORIF AFFIXUS NAIL;  Surgeon: Paralee Cancel, MD;  Location: [facility];  Service: Orthopedics;  Laterality: Left;  . IR REPLC  GASTRO/COLONIC TUBE PERCUT W/FLUORO  09/29/2016  . opirectinal membrane peel    . PEG PLACEMENT    . RHINOPLASTY  1983  . TRACHEOSTOMY    . TRACHEOSTOMY TUBE PLACEMENT N/A 12/15/2016   Procedure: TRACHEOSTOMY CHANGE;  Surgeon: [name] Montane, MD;  Location: [facility];  Service: ENT;  Laterality: N/A;    There were no vitals filed for this visit.  Subjective Assessment - 06/09/17 1110    Subjective   Smiling and said "yes" when asked if it felt good to start some stepping.    Patient is accompained by:  Family member husband [name] and caregiver Leesa    Pertinent History  see epic snapshot - ABI/encephalitis/hypoxia from virus; recent hospitalization due to MRSA PNA     Patient Stated Goals  Pt unable to state.     Currently in Pain?  No/denies                   OT Treatments/Exercises (OP) - 06/09/17 0001      Neurological Re-education Exercises   Other Exercises 1  Neuro re ed to address forward weight shift from sitting, sit to stand from mat and wheelchair, static standing using upright walker.  Addressed postural alignment in standing with emphasis on thoracic extension and  moving head toward midline while enouraging weight shift to LLE. By end of session pt required mod a x1 for static standing with UE support however pt with very poor alignment.  Also addressed stepping with upright walker - max a x2 with a third person to stabilize and control walker.                 OT Short Term Goals - 06/09/17 1156      OT SHORT TERM GOAL #1   Title  Pt will be mod a for sit to stand from toilet in steady to reduce care giver burden. - 06/07/2017    Status  On-going      OT SHORT TERM GOAL #2   Title  Pt will sit on level surface with 1 UE support only in preparation for participation in basic self care tasks for at least 3 minutes consistently    Status  On-going      OT SHORT TERM GOAL #3   Title  Pt will maintain static standing balance with BUE support during ADL activity   with mod a for at least 3 minutes consistently    Status  On-going      OT SHORT TERM GOAL #4   Title  Pt will roll during LB dressing with max a x1 to reduce caregiver burden.     Status  On-going      OT SHORT TERM GOAL #5   Title  Pt will demonstrate ability to follow simple, functional commands in context at least 50% of the time.     Status  Achieved        OT Long Term Goals - 06/09/17 1157      OT LONG TERM GOAL #1   Title  Pt will go from sit to stand from toilet with steady with min a to reduce caregiver burden - 07/05/2017    Status  On-going      OT LONG TERM GOAL #2   Title  Pt will sit on level surfaces for participation in basic self care tasks with no UE support for at least 5 minutes    Status  On-going      OT LONG TERM GOAL #3   Title  Pt will maintain static standing balance with BUE support duirng ADL activity with min a for at least 3 minutes    Status  On-going      OT LONG TERM GOAL #4   Title  Pt wil roll during LB dressing with mod a x1 to reduce caregiver burden.      Status  On-going            Plan - 06/09/17 1157    Clinical Impression Statement  Pt with very slow pre skill improvement for sitting balance and static standing balance with UE support.     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    Occupational performance deficits (Please refer to evaluation for details):  ADL's;IADL's;Rest and Sleep;Leisure;Social Participation;Work    Physicist, medical    Current Impairments/barriers affecting progress:  severity of deficits, very slow rate of recovery; pt with recent significant decline over past several months due medical complications    OT Frequency  2x / week    OT Duration  8 weeks    OT  Treatment/Interventions   Self-care/ADL training;Ultrasound;Moist Heat;Electrical Stimulation;DME and/or AE instruction;Neuromuscular education;Therapeutic exercise;Functional Mobility Training;Manual Therapy;Passive range of motion;Splinting;Therapeutic activities;Balance training;Patient/family education;Visual/perceptual remediation/compensation;Cognitive remediation/compensation    Plan   review splints from prior episodes, NMR trunk and LE's for transitional movements, static standing, sitting balance, sit to stand, stand to sit, stepping    Consulted and Agree with Plan of Care  Patient;Family member/caregiver    Family Member Consulted  husband [name] and caregiver Leesa       Patient will benefit from skilled therapeutic intervention in order to improve the following deficits and impairments:  Decreased endurance, Decreased skin integrity, Impaired vision/preception, Improper body mechanics, Impaired perceived functional ability, Decreased knowledge of precautions, Cardiopulmonary status limiting activity, Decreased activity tolerance, Decreased knowledge of use of DME, Decreased strength, Impaired flexibility, Improper spinal/pelvic alignment, Impaired sensation, Difficulty walking, Decreased mobility, Decreased balance, Decreased cognition, Decreased range of motion, Impaired tone, Impaired UE functional use, Decreased safety awareness, Decreased coordination, Pain, Abnormal gait  Visit Diagnosis: Muscle weakness (generalized)  Abnormal posture  Unsteadiness on feet  Other abnormalities of gait and mobility  Other symptoms and signs involving the nervous system  Frontal lobe and executive function deficit  Stiffness of left elbow, not elsewhere classified  Stiffness of left shoulder, not elsewhere classified  Stiffness of right shoulder, not elsewhere classified  Spastic tetraplegia (HCC)    Problem List Patient Active Problem List   Diagnosis Date Noted  . Coagulase negative Staphylococcus  bacteremia 02/09/2017  . Abdominal distension   . Chronic mucus hypersecretion, respiratory 12/31/2016  . Severe protein-calorie malnutrition [name]: less than 60% of standard weight) ([place]) 12/31/2016  . Acute urinary retention 12/31/2016  . Constipation 12/31/2016  . Shortness of breath   . Respiratory distress   . Pneumonia 12/05/2016  . Acute respiratory failure ([place]) 12/05/2016  . Pseudomonas urinary tract infection 10/27/2016  . Severe muscle deconditioning 09/30/2016  . Pressure injury of skin 09/28/2016  . Respiratory failure ([place])   . Tracheobronchitis   . Urinary retention   . PEG (percutaneous endoscopic gastrostomy) status ([place])   . Benign essential HTN   . Hyponatremia   . Hypokalemia   . Reactive hypertension   . Respiratory insufficiency 09/24/2016  . Malignant neoplasm of upper-inner quadrant of left breast in female, estrogen receptor positive ([place]) 07/14/2016  . Heterotopic ossification of bone 03/10/2016  . Acute on chronic respiratory failure with hypoxia ([place])   . Acute hypoxemic respiratory failure ([place])   . Acute blood loss anemia   . Atelectasis 02/04/2016  . Debility 02/03/2016  . Pneumonia of both lower lobes due to Pseudomonas species ([place])   . Pneumonia of both lower lobes due to methicillin susceptible Staphylococcus aureus (MSSA) ([place])   . Acute respiratory failure with hypoxia ([place])   . Acute encephalopathy   . Seizures ([place])   . Sepsis ([place])   . Hypoxia   . Pain   . Chronic respiratory failure ([place]) 12/28/2015  . Acute tracheobronchitis 12/24/2015  . Tracheostomy tube present ([place])   . Tracheal stenosis   . Chronic respiratory failure with hypoxia ([place])   . Spastic tetraplegia ([place]) 10/20/2015  . Tracheostomy status ([place]) 09/26/2015  . Allergic rhinitis 09/26/2015  . 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 Movement disorder 09/22/2015  . Encephalitis 09/22/2015  . Chest pain 02/07/2014  . Premature atrial contractions  01/13/2014  . Abnormality of gait 12/04/2013  . Hyperlipidemia 12/21/2011  . Cardiovascular risk factor 12/21/2011  . Bunion 01/31/2008  . Metatarsalgia of both feet  09/20/2007  . FLAT FOOT 09/20/2007  . HALLUX RIGIDUS, ACQUIRED 09/20/2007    Quay Burow, OTR/L 06/09/2017, 12:00 PM  [facility] [address] [place], [place], [address] Phone: [phone]   Fax:  [phone]  Name: Jacquel Redditt MRN: [id number] Date of Birth: Sep 09, 1950

## 2017-06-09 NOTE — Therapy (Signed)
[facility] [address] [place], [place], [address] Phone: [phone]   Fax:  [phone]  Speech Language Pathology Treatment  Patient Details  Name: Judy Spears MRN: [id number] Date of Birth: 02-06-1950 Referring Provider: Levada Schilling, MD   Encounter Date: 06/09/2017  End of Session - 06/09/17 1713    Visit Number  7    Number of Visits  17    Date for SLP Re-Evaluation  07/22/17    SLP Start Time  0934    SLP Stop Time   8466    SLP Time Calculation (min)  41 min    Activity Tolerance  Patient tolerated treatment well       Past Medical History:  Diagnosis Date  . Arthritis    lt great toe  . Complication of anesthesia    pt has had headaches post op-did advise to hydra well preop  . Hair loss    right-sided  . History of exercise stress test    a. ETT (12/15) with 12:00 exercise, no ST changes, occasional PACs.   . Hyperlipidemia   . Insomnia   . Migraine headache   . Movement disorder    resltess in left legs  . Postmenopausal   . 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 Premature atrial contractions    a. Holter (12/15) with 8% PACs, no atrial runs or atrial fibrillation.     Past Surgical History:  Procedure Laterality Date  . BREAST BIOPSY  01/01/2011   Procedure: BREAST BIOPSY WITH NEEDLE LOCALIZATION;  Surgeon: Rolm Bookbinder, MD;  Location: [facility];  Service: General;  Laterality: Left;  left breast wire localization biopsy  . BREAST BIOPSY Right 07/27/2012  . BREAST BIOPSY Right 07/07/2016  . BREAST BIOPSY Left 06/30/2016  . cataracts right eye    . CESAREAN SECTION  1986  . HERNIA REPAIR  2000   RIH  . INTRAMEDULLARY (IM) NAIL INTERTROCHANTERIC Left 01/30/2017   Procedure: ORIF AFFIXUS NAIL;  Surgeon: Paralee Cancel, MD;  Location: [facility];  Service: Orthopedics;  Laterality: Left;  . IR REPLC GASTRO/COLONIC TUBE PERCUT W/FLUORO  09/29/2016  . opirectinal membrane  peel    . PEG PLACEMENT    . RHINOPLASTY  1983  . TRACHEOSTOMY    . TRACHEOSTOMY TUBE PLACEMENT N/A 12/15/2016   Procedure: TRACHEOSTOMY CHANGE;  Surgeon: [name] Montane, MD;  Location: [facility];  Service: ENT;  Laterality: N/A;    There were no vitals filed for this visit.  Subjective Assessment - 06/09/17 1653    Subjective  Pt very somnolent today. Labile for approx 20 seconds until SLP easily redirected.    Patient is accompained by:  Family member [name] and [name]    Currently in Pain?  No/denies            ADULT SLP TREATMENT - 06/09/17 1654      General Information   Behavior/Cognition  Cooperative;Lethargic;Requires cueing;Pleasant mood      Treatment Provided   Treatment provided  Cognitive-Linquistic      Cognitive-Linquistic Treatment   Treatment focused on  Dysarthria;Patient/family/caregiver education    Skilled Treatment  SLP told family/caregiver that pt needed to have more alertness more frequently or ST is not very effective. IMITATION: Voice: 2-syllable 71% (5/7), 3-syllable 50% (2/4) - Intelligible: 2-syllable 43% (3/7), 3-syllable 50% (2/4). SPONTANEOUS: Voice: 2-syllable 71% (5/7), 3-syllable 50% (1/2), 4+ syllable 33% (2/6) - Intelligibility: 2-syllable 57% (4/7), 3-syllable 100% (2/2) 4+syllable 0% (0/6).  Assessment / Recommendations / Plan   Plan  Continue with current plan of care (?) reduce x1/week or d/c if alertness doesn't improve      Progression Toward Goals   Progression toward goals  Not progressing toward goals (comment) due to somnolence       SLP Education - 06/09/17 1712    Education provided  Yes    Education Details  alertness must improve and become more consistent for pt benefit in ST    Person(s) Educated  Spouse;Caregiver(s)    Methods  Explanation    Comprehension  Verbalized understanding       SLP Short Term Goals - 06/09/17 1727      SLP SHORT TERM GOAL #1   Title  Pt will demonstrate dysarthria HEP to increase  articulatory coordination for speech with usual mod A over 3 sessions    Time  2    Period  Weeks    Status  On-going      SLP SHORT TERM GOAL #2   Title  Pt will achieve phonation adequate for SLP intelligibility in imitated two-syllable words using intelligibility compensations 12/20 trials with usual min A in 3 sessions    Time  2    Period  Weeks    Status  On-going      SLP SHORT TERM GOAL #3   Title  Pt will imitate 2-syllable words or phrases with functional intelligibility for SLP in 5/15 trials over 3 sessions     Time  2    Period  Weeks    Status  On-going      SLP SHORT TERM GOAL #4   Title  Pt will sustain attention to therapy tasks for 10 minutes with multimodal cues in 5 sessions    Baseline  05-23-17    Time  2    Period  Weeks    Status  On-going      SLP SHORT TERM GOAL #5   Title  pt family will demo understanding of oral care regimen, including safe ingestion of water/ice chips over three sessions    Time  2    Period  Weeks    Status  On-going      SLP SHORT TERM GOAL #6   Title  pt will demo dysphagia HEP to improve management of secretions and safety with any possible POs    Time  2    Period  Weeks    Status  On-going      SLP SHORT TERM GOAL #7   Title  family/caregivers will demo understanding of pt swallowing precautions, for pt safety with POs, by verbal report     Time  2    Period  Weeks    Status  On-going       SLP Long Term Goals - 06/09/17 1728      SLP LONG TERM GOAL #1   Title  Pt will demo dysarthria  HEP to incr articulatory coordination for speech, with occasional mod A over three sessions    Time  6    Period  Weeks    Status  On-going      SLP LONG TERM GOAL #2   Title  pt will achieve phonation adequate for SLP intelligibility in imitated 3-syllable words/3-word phrases using intelligibility compenastions 15/20 trials    Time  6    Period  Weeks    Status  On-going      SLP LONG TERM GOAL #3  Title  Pt will imitate  2-syllable words or phrases with functional intelligibility for SLP in 8/15 trials over three sessions    Time  6    Period  Weeks    Status  On-going      SLP LONG TERM GOAL #4   Title  pt will sustain attention to therapy tasks for 10 minutes with multimodal cues in 10 sessions    Time  6    Period  Weeks    Status  On-going      SLP LONG TERM GOAL #5   Title  pt will demo dysphagia HEP to improve management of secretions and safety with possible POs    Time  6    Period  Weeks    Status  On-going       Plan - 06/09/17 1713    Clinical Impression Statement  Patient presents with ongoing deficits in speech intelligibility (dysarthria, reduced breath support), cognitive communication and swallowing. Pt somnolent today requiring consistent verbal and tactile stimulation to increase alertness. SLP told pt husband and caregiver pt must be more alert and more consistently alert for maximum benefit of ST. SLP to try to incr pt alertness with movement and posturing in some subsequent sessions to attempt to maximize benefits from ST goals. Pt was mildly congested today but did not inhibit intelligibility she was able to achieve. See skilled intervention for details of voicing, functional speech intelligilibty for imitation and spontaneous production of 2-4+ syllable words/phrases. Recommend continued direct skilled ST to address pt's decline in communication skills, and to monitor a HEP for dysphagia and dysarthria in order to maximize functional communication, secretion management/ swallowing safety, reduce caregiver burden and improve quality of life.    Speech Therapy Frequency  2x / week    Treatment/Interventions  Aspiration precaution training;Trials of upgraded texture/liquids;Compensatory strategies;Functional tasks;Patient/family education;Cueing hierarchy;Diet toleration management by SLP;Cognitive reorganization;Compensatory techniques;SLP instruction and feedback;Internal/external  aids;Multimodal communcation approach;Pharyngeal strengthening exercises;Oral motor exercises    Potential to Achieve Goals  Fair    Potential Considerations  Ability to learn/carryover information;Co-morbidities;Severity of impairments;Cooperation/participation level;Previous level of function    Consulted and Agree with Plan of Care  Patient;Family member/caregiver    Family Member Consulted  Husband, [name]       Patient will benefit from skilled therapeutic intervention in order to improve the following deficits and impairments:   Dysarthria and anarthria  Cognitive communication deficit  Aphasia  Dysphagia, oropharyngeal phase    Problem List Patient Active Problem List   Diagnosis Date Noted  . Coagulase negative Staphylococcus bacteremia 02/09/2017  . Abdominal distension   . Chronic mucus hypersecretion, respiratory 12/31/2016  . Severe protein-calorie malnutrition [name]: less than 60% of standard weight) ([place]) 12/31/2016  . Acute urinary retention 12/31/2016  . Constipation 12/31/2016  . Shortness of breath   . Respiratory distress   . Pneumonia 12/05/2016  . Acute respiratory failure ([place]) 12/05/2016  . Pseudomonas urinary tract infection 10/27/2016  . Severe muscle deconditioning 09/30/2016  . Pressure injury of skin 09/28/2016  . Respiratory failure ([place])   . Tracheobronchitis   . Urinary retention   . PEG (percutaneous endoscopic gastrostomy) status ([place])   . Benign essential HTN   . Hyponatremia   . Hypokalemia   . Reactive hypertension   . Respiratory insufficiency 09/24/2016  . Malignant neoplasm of upper-inner quadrant of left breast in female, estrogen receptor positive ([place]) 07/14/2016  . Heterotopic ossification of bone 03/10/2016  . Acute on chronic respiratory failure with  hypoxia ([place])   . Acute hypoxemic respiratory failure ([place])   . Acute blood loss anemia   . Atelectasis 02/04/2016  . Debility 02/03/2016  . Pneumonia of both lower lobes due  to Pseudomonas species ([place])   . Pneumonia of both lower lobes due to methicillin susceptible Staphylococcus aureus (MSSA) ([place])   . Acute respiratory failure with hypoxia ([place])   . Acute encephalopathy   . Seizures ([place])   . Sepsis ([place])   . Hypoxia   . Pain   . Chronic respiratory failure ([place]) 12/28/2015  . Acute tracheobronchitis 12/24/2015  . Tracheostomy tube present ([place])   . Tracheal stenosis   . Chronic respiratory failure with hypoxia ([place])   . Spastic tetraplegia ([place]) 10/20/2015  . Tracheostomy status ([place]) 09/26/2015  . Allergic rhinitis 09/26/2015  . 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 Movement disorder 09/22/2015  . Encephalitis 09/22/2015  . Chest pain 02/07/2014  . Premature atrial contractions 01/13/2014  . Abnormality of gait 12/04/2013  . Hyperlipidemia 12/21/2011  . Cardiovascular risk factor 12/21/2011  . Bunion 01/31/2008  . Metatarsalgia of both feet 09/20/2007  . FLAT FOOT 09/20/2007  . HALLUX RIGIDUS, ACQUIRED 09/20/2007    [facility] ,MS, CCC-SLP  06/09/2017, 5:29 PM  [facility] [address] [place], [place], [address] Phone: [phone]   Fax:  [phone]   Name: Judy Spears MRN: [id number] Date of Birth: [birth year]

## 2017-06-10 ENCOUNTER — Ambulatory Visit: Payer: Medicare Other

## 2017-06-10 ENCOUNTER — Encounter: Payer: Self-pay | Admitting: Physical Therapy

## 2017-06-10 DIAGNOSIS — R4701 Aphasia: Secondary | ICD-10-CM

## 2017-06-10 DIAGNOSIS — R471 Dysarthria and anarthria: Secondary | ICD-10-CM

## 2017-06-10 DIAGNOSIS — R1312 Dysphagia, oropharyngeal phase: Secondary | ICD-10-CM

## 2017-06-10 DIAGNOSIS — R41841 Cognitive communication deficit: Secondary | ICD-10-CM

## 2017-06-10 NOTE — Therapy (Signed)
[facility] [address] [place], [place], [address] Phone: [phone]   Fax:  [phone]  Physical Therapy Treatment  Patient Details  Name: Judy Spears MRN: [id number] Date of Birth: 13-May-1950 Referring Provider: Dr. Levada Schilling   Encounter Date: 06/09/2017  PT End of Session - 06/09/17 1249    Visit Number  7    Number of Visits  17    Date for PT Re-Evaluation  07/09/17    Authorization Type  [employer] Medicare    Authorization Time Period  05-10-17 - 08-08-17    PT Start Time  1020    PT Stop Time  1100    PT Time Calculation (min)  40 min    Activity Tolerance  Patient tolerated treatment well    Behavior During Therapy  [facility] for tasks assessed/performed       Past Medical History:  Diagnosis Date  . Arthritis    lt great toe  . Complication of anesthesia    pt has had headaches post op-did advise to hydra well preop  . Hair loss    right-sided  . History of exercise stress test    a. ETT (12/15) with 12:00 exercise, no ST changes, occasional PACs.   . Hyperlipidemia   . Insomnia   . Migraine headache   . Movement disorder    resltess in left legs  . Postmenopausal   . 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 Premature atrial contractions    a. Holter (12/15) with 8% PACs, no atrial runs or atrial fibrillation.     Past Surgical History:  Procedure Laterality Date  . BREAST BIOPSY  01/01/2011   Procedure: BREAST BIOPSY WITH NEEDLE LOCALIZATION;  Surgeon: Rolm Bookbinder, MD;  Location: [facility];  Service: General;  Laterality: Left;  left breast wire localization biopsy  . BREAST BIOPSY Right 07/27/2012  . BREAST BIOPSY Right 07/07/2016  . BREAST BIOPSY Left 06/30/2016  . cataracts right eye    . CESAREAN SECTION  1986  . HERNIA REPAIR  2000   RIH  . INTRAMEDULLARY (IM) NAIL INTERTROCHANTERIC Left 01/30/2017   Procedure: ORIF AFFIXUS NAIL;  Surgeon: Paralee Cancel, MD;  Location:  [facility];  Service: Orthopedics;  Laterality: Left;  . IR REPLC GASTRO/COLONIC TUBE PERCUT W/FLUORO  09/29/2016  . opirectinal membrane peel    . PEG PLACEMENT    . RHINOPLASTY  1983  . TRACHEOSTOMY    . TRACHEOSTOMY TUBE PLACEMENT N/A 12/15/2016   Procedure: TRACHEOSTOMY CHANGE;  Surgeon: [name] Montane, MD;  Location: [facility];  Service: ENT;  Laterality: N/A;    There were no vitals filed for this visit.  Subjective Assessment - 06/09/17 1248    Subjective  No new complaints. No falls or pain to report.    Patient is accompained by:  Family member chip and lesa    Pertinent History  [place]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  No/denies    Pain Score  0-No pain          [facility] Adult PT Treatment/Exercise - 06/09/17 1730      Transfers   Transfers  Sit to Stand;Stand to Sit    Sit to Stand  4: Min assist;3: Mod assist;With upper extremity assist;From bed;From chair/3-in-1    Sit to Stand Details  Tactile cues for sequencing;Tactile cues  for posture;Tactile cues for weight shifting;Verbal cues for sequencing;Verbal cues for technique;Manual facilitation for weight shifting;Manual facilitation for placement;Manual facilitation for weight bearing    Sit to Stand Details (indicate cue type and reason)  x1 from wheelchair, x 3 from mat table    Stand to Sit  4: Min assist;3: Mod assist;With upper extremity assist;To bed    Stand to Sit Details (indicate cue type and reason)  Tactile cues for sequencing;Tactile cues for weight shifting;Tactile cues for posture;Tactile cues for placement;Verbal cues for sequencing;Verbal cues for technique;Manual facilitation for weight shifting;Manual facilitation for placement;Manual facilitation for weight bearing    Stand to Sit Details  x3 to mat table, x1 to wheelchair    Stand Pivot Transfers  3: Mod assist    Stand Pivot Transfer Details (indicate cue  type and reason)  wheelchair to mat table with pt taking side steps, self advancing bil LE's today with assist/faclitation for weight shifitng.       Ambulation/Gait   Ambulation/Gait  Yes    Ambulation/Gait Assistance  2: Max assist;3: Mod assist mod/max assist of 2 people    Ambulation Distance (Feet)  40 Feet    Assistive device  2 person hand held assist    Gait Pattern  Step-to pattern;Step-through pattern;Decreased stride length;Decreased dorsiflexion - right;Decreased dorsiflexion - left;Decreased weight shift to left;Ataxic;Trunk flexed;Narrow base of support;Poor foot clearance - left      Neuro Re-ed    Neuro Re-ed Details   for balance/strengthening/muscle re-education: seated at edge of mat- elbow taps to mat and back up to tall posture x 5 each side with min to mod assist, cues on technique, ant/post weight shifting to facilitate increased fwd weight shifting to intiate power up through LE's with min assist, cues needed. standing next to mat table: worked on tall posture with facilitation at hips, shoulder and cues for knee/hip/trunk extension. once pt was as tall as possible worked on lateral weight shifting, progressing to fwd/bwd stepping in place. pt able to self advance left LE with min to mod assist for balance/posture and weight shifting onto right LE, assitance needed with fwd advacement of right LE, pt able to self advance bwd.                            Knee/Hip Exercises: Supine   Bridges  AROM;Strengthening;Both;1 set;10 reps;Limitations;AAROM    Bridges Limitations  assist needed to stabilized LE's with cues for full hip lifting and to slow down with movements    Straight Leg Raises  --    Straight Leg Raises Limitations  --    Other Supine Knee/Hip Exercises  in supine on bolster/pillows: alternating hip falls outs x 10 reps each side AROM with cues for form/technique, assist to stabilize other leg; alternating marching x 10 reps A/ROM to AA/ROM with assist needed on left  > right side for lifting, cues on slow, controlled movements.         PT Short Term Goals - 06/10/17 1305      PT SHORT TERM GOAL #1   Title  Perform sit to/from stand from wheelchair or mat with +1 mod assist.    Baseline  06/09/16: met today    Status  Achieved      PT SHORT TERM GOAL #2   Title  Pt will perform sit to stand from wheelchair to RW with max assist for pre-gait skill.    Baseline  06/09/17: met today  Status  Achieved      PT SHORT TERM GOAL #3   Title  Pt will transfer supine to sit with max assist.    Baseline  06/09/17: met today, needs increased time to assist    Status  Achieved      PT SHORT TERM GOAL #4   Title  Pt will sit with RUE support with min to mod assist unsupported on side of mat for at least 3" to demo improved trunk strength and core stabilization.    Baseline  06/09/17: met today     Status  Achieved      PT SHORT TERM GOAL #5   Title  Pt will amb. 15' with appropriate assistive device with max assist +1    Baseline  06/09/17: pt now needs mod/max assist of 2 people for gait using modified 3 muskateer assist and blue rocker AFO to right LE    Status  Partially Met        PT Long Term Goals - 05/11/17 2122      PT LONG TERM GOAL #1   Title  Pt will perform supine rolling R and L with min assist  to indicate improved independence with bed mobility.      Baseline  dependent for all bed mobility per spouse's report - 05-10-17    Time  8    Period  Weeks    Status  New    Target Date  07/09/17      PT LONG TERM GOAL #2   Title  Pt will sit on side of mat with RUE support for at least 5" with SBA     Baseline  max assist to min assist - ability varies    Time  8    Period  Weeks    Status  New    Target Date  07/09/17      PT LONG TERM GOAL #3   Title  Pt will amb. 20' with appropriate assistive device with +1 max assist.    Baseline  +2 max - total assist- pt unable to advance LE's - feet not clearing due to pt not wearing AFO's -  05-10-17    Time  8    Period  Weeks    Status  New    Target Date  07/09/17      PT LONG TERM GOAL #4   Title  Pt will perform basic transfers with min to mod assist to indicate improved functional independence.      Baseline  max assist - 05-10-17    Time  8    Period  Weeks    Target Date  07/09/17      PT LONG TERM GOAL #5   Title  Pt will perform static standing x 3" with bil. UE support with mod assist.      Time  8    Period  Weeks    Status  New    Target Date  07/09/17      Additional Long Term Goals   Additional Long Term Goals  Yes      PT LONG TERM GOAL #6   Title  Pt will be able to return to participation in therapeutic horseback riding at [facility] for ongoing exercise program.    Time  8    Period  Weeks    Status  New    Target Date  07/09/17            Plan -  06/09/17 1249    Clinical Impression Statement  Today's skilled session continued to address stretching, strengthening, transfers and gait. With 2 person assistance pt was able to achieve more upright posture and initiate LE advancement with bil LE's. Used blue rocker brace on right side with improved clearance/control of tone noted. On right LE pt able to initiated knee flexion onto toes, needing assist to fully adavance for swing phase (? use of simulated toe cap to improve this). All STGs either met or partially met today.  Pt continues to progress toward goals and should benefit from continued PT to progress toward unmet goals.                                Rehab Potential  Fair    Clinical Impairments Affecting Rehab Potential  severity of deficits with previous extensive therapies    PT Frequency  2x / week    PT Duration  8 weeks    PT Treatment/Interventions  ADLs/Self Care Home Management;Therapeutic exercise;Therapeutic activities;Functional mobility training;Gait training;Balance training;Neuromuscular re-education;Patient/family education;Orthotic Fit/Training;Passive range of motion     PT Next Visit Plan  assess remaining STGs; continued to address transfers, standing balance and pre gait    PT Home Exercise Plan  caregiver and husband have previously been instructed in ROM, sitting balance, and transfer training    Consulted and Agree with Plan of Care  Patient       Patient will benefit from skilled therapeutic intervention in order to improve the following deficits and impairments:  Abnormal gait, Decreased activity tolerance, Decreased balance, Decreased cognition, Decreased coordination, Decreased mobility, Decreased range of motion, Decreased endurance, Hypomobility, Decreased strength, Impaired flexibility, Increased muscle spasms, Impaired UE functional use, Postural dysfunction, Pain  Visit Diagnosis: Muscle weakness (generalized)  Abnormal posture  Unsteadiness on feet  Other abnormalities of gait and mobility  Other symptoms and signs involving the nervous system     Problem List Patient Active Problem List   Diagnosis Date Noted  . Coagulase negative Staphylococcus bacteremia 02/09/2017  . Abdominal distension   . Chronic mucus hypersecretion, respiratory 12/31/2016  . Severe protein-calorie malnutrition [name]: less than 60% of standard weight) ([place]) 12/31/2016  . Acute urinary retention 12/31/2016  . Constipation 12/31/2016  . Shortness of breath   . Respiratory distress   . Pneumonia 12/05/2016  . Acute respiratory failure ([place]) 12/05/2016  . Pseudomonas urinary tract infection 10/27/2016  . Severe muscle deconditioning 09/30/2016  . Pressure injury of skin 09/28/2016  . Respiratory failure ([place])   . Tracheobronchitis   . Urinary retention   . PEG (percutaneous endoscopic gastrostomy) status ([place])   . Benign essential HTN   . Hyponatremia   . Hypokalemia   . Reactive hypertension   . Respiratory insufficiency 09/24/2016  . Malignant neoplasm of upper-inner quadrant of left breast in female, estrogen receptor positive ([place]) 07/14/2016   . Heterotopic ossification of bone 03/10/2016  . Acute on chronic respiratory failure with hypoxia ([place])   . Acute hypoxemic respiratory failure ([place])   . Acute blood loss anemia   . Atelectasis 02/04/2016  . Debility 02/03/2016  . Pneumonia of both lower lobes due to Pseudomonas species ([place])   . Pneumonia of both lower lobes due to methicillin susceptible Staphylococcus aureus (MSSA) ([place])   . Acute respiratory failure with hypoxia ([place])   . Acute encephalopathy   . Seizures ([place])   . Sepsis ([place])   . Hypoxia   .  Pain   . Chronic respiratory failure ([place]) 12/28/2015  . Acute tracheobronchitis 12/24/2015  . Tracheostomy tube present ([place])   . Tracheal stenosis   . Chronic respiratory failure with hypoxia ([place])   . Spastic tetraplegia ([place]) 10/20/2015  . Tracheostomy status ([place]) 09/26/2015  . Allergic rhinitis 09/26/2015  . 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 Movement disorder 09/22/2015  . Encephalitis 09/22/2015  . Chest pain 02/07/2014  . Premature atrial contractions 01/13/2014  . Abnormality of gait 12/04/2013  . Hyperlipidemia 12/21/2011  . Cardiovascular risk factor 12/21/2011  . Bunion 01/31/2008  . Metatarsalgia of both feet 09/20/2007  . FLAT FOOT 09/20/2007  . HALLUX RIGIDUS, ACQUIRED 09/20/2007    Willow Ora, PTA, [facility] [address], [place], [place] [address] [phone] 06/10/17, 3:39 PM   Name: Judy Spears MRN: [id number] Date of Birth: 10-27-1950

## 2017-06-13 NOTE — Patient Instructions (Signed)
Do speech tasks with Judy Spears whenever she is most alert, like [name] mentioned with me during the previous session this week.

## 2017-06-13 NOTE — Therapy (Signed)
[facility] [address] [place], [place], [address] Phone: [phone]   Fax:  [phone]  Speech Language Pathology Treatment  Patient Details  Name: Judy Spears MRN: [id number] Date of Birth: March 12, 1950 Referring Provider: Levada Schilling, MD   Encounter Date: 06/10/2017  End of Session - 06/13/17 1117    Visit Number  8    Number of Visits  17    Date for SLP Re-Evaluation  07/22/17    SLP Start Time  43    SLP Stop Time   1100    SLP Time Calculation (min)  40 min       Past Medical History:  Diagnosis Date  . Arthritis    lt great toe  . Complication of anesthesia    pt has had headaches post op-did advise to hydra well preop  . Hair loss    right-sided  . History of exercise stress test    a. ETT (12/15) with 12:00 exercise, no ST changes, occasional PACs.   . Hyperlipidemia   . Insomnia   . Migraine headache   . Movement disorder    resltess in left legs  . Postmenopausal   . 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 Premature atrial contractions    a. Holter (12/15) with 8% PACs, no atrial runs or atrial fibrillation.     Past Surgical History:  Procedure Laterality Date  . BREAST BIOPSY  01/01/2011   Procedure: BREAST BIOPSY WITH NEEDLE LOCALIZATION;  Surgeon: Rolm Bookbinder, MD;  Location: [facility];  Service: General;  Laterality: Left;  left breast wire localization biopsy  . BREAST BIOPSY Right 07/27/2012  . BREAST BIOPSY Right 07/07/2016  . BREAST BIOPSY Left 06/30/2016  . cataracts right eye    . CESAREAN SECTION  1986  . HERNIA REPAIR  2000   RIH  . INTRAMEDULLARY (IM) NAIL INTERTROCHANTERIC Left 01/30/2017   Procedure: ORIF AFFIXUS NAIL;  Surgeon: Paralee Cancel, MD;  Location: [facility];  Service: Orthopedics;  Laterality: Left;  . IR REPLC GASTRO/COLONIC TUBE PERCUT W/FLUORO  09/29/2016  . opirectinal membrane peel    . PEG PLACEMENT    . RHINOPLASTY  1983  .  TRACHEOSTOMY    . TRACHEOSTOMY TUBE PLACEMENT N/A 12/15/2016   Procedure: TRACHEOSTOMY CHANGE;  Surgeon: [name] Montane, MD;  Location: [facility];  Service: ENT;  Laterality: N/A;    There were no vitals filed for this visit.         ADULT SLP TREATMENT - 06/13/17 0001      General Information   Behavior/Cognition  Alert;Cooperative;Pleasant mood;Distractible;Requires cueing      Treatment Provided   Treatment provided  Cognitive-Linquistic      Cognitive-Linquistic Treatment   Treatment focused on  Dysarthria;Patient/family/caregiver education    Skilled Treatment   IMITATION: Voice: 1-syllable 41% (7/17) 2-syllable 44% (4/9), 3-syllable 66% (4/6) - Intelligible: 1-syllable 47% (8/17) 2-syllable 33% (3/9), 3-syllable 29% (2/7). SPONTANEOUS: Voice: 1-syllable 100% (7/7) 2-syllable 50% (3/6), 3-syllable 60% (3/5), 4+ syllable 33% (1/3) - Intelligibility: 1-syllable 86% (6/7), 2-syllable 16% (1/6), 3-syllable 20% (1/5) 4+syllable 0% (0/3). Pt was more alert and awake today in parallel bars than in ST room or elsewhere in her wheelchair in previous sessions. SLP let pt husband know that this is a viable option for therapy, when possible.      Assessment / Recommendations / Plan   Plan  Continue with current plan of care use standing with +1-2 assistance when possible  Progression Toward Goals   Progression toward goals  Progressing toward goals incr'd level of alertness       SLP Education - 06/13/17 1116    Education provided  Yes    Education Details  perform speech tasks with pt throughout the day as alertness allows    Person(s) Educated  Patient;Caregiver(s);Spouse    Methods  Explanation    Comprehension  Verbalized understanding       SLP Short Term Goals - 06/13/17 1124      SLP SHORT TERM GOAL #1   Title  Pt will demonstrate dysarthria HEP to increase articulatory coordination for speech with usual mod A over 3 sessions    Time  2    Period  Weeks    Status   On-going      SLP SHORT TERM GOAL #2   Title  Pt will achieve phonation adequate for SLP intelligibility in imitated two-syllable words using intelligibility compensations 12/20 trials with usual min A in 3 sessions    Time  2    Period  Weeks    Status  On-going      SLP SHORT TERM GOAL #3   Title  Pt will imitate 2-syllable words or phrases with functional intelligibility for SLP in 5/15 trials over 3 sessions     Time  2    Period  Weeks    Status  On-going      SLP SHORT TERM GOAL #4   Title  Pt will sustain attention to therapy tasks for 10 minutes with multimodal cues in 5 sessions    Baseline  05-23-17, 06-10-17    Time  2    Period  Weeks    Status  On-going      SLP SHORT TERM GOAL #5   Title  pt family will demo understanding of oral care regimen, including safe ingestion of water/ice chips over three sessions    Time  2    Period  Weeks    Status  On-going      SLP SHORT TERM GOAL #6   Title  pt will demo dysphagia HEP to improve management of secretions and safety with any possible POs    Time  2    Period  Weeks    Status  On-going      SLP SHORT TERM GOAL #7   Title  family/caregivers will demo understanding of pt swallowing precautions, for pt safety with POs, by verbal report     Time  2    Period  Weeks    Status  On-going       SLP Long Term Goals - 06/13/17 1126      SLP LONG TERM GOAL #1   Title  Pt will demo dysarthria  HEP to incr articulatory coordination for speech, with occasional mod A over three sessions    Time  6    Period  Weeks    Status  On-going      SLP LONG TERM GOAL #2   Title  pt will achieve phonation adequate for SLP intelligibility in imitated 3-syllable words/3-word phrases using intelligibility compenastions 15/20 trials    Time  6    Period  Weeks    Status  On-going      SLP LONG TERM GOAL #3   Title  Pt will imitate 2-syllable words or phrases with functional intelligibility for SLP in 8/15 trials over three sessions     Time  6  Period  Weeks    Status  On-going      SLP LONG TERM GOAL #4   Title  pt will sustain attention to therapy tasks for 10 minutes with multimodal cues in 10 sessions    Baseline  06-10-17    Time  6    Period  Weeks    Status  On-going      SLP LONG TERM GOAL #5   Title  pt will demo dysphagia HEP to improve management of secretions and safety with possible POs    Time  6    Period  Weeks    Status  On-going       Plan - 06/13/17 1117    Clinical Impression Statement  Patient presents with ongoing deficits in speech intelligibility (dysarthria, reduced breath support), cognitive communication and swallowing. Pt alertness improved today, with movement and posturing, so pt was in more of a "ready-state" for possibility of progress. Pt was mildly congested today but cleared with small sips H2O (aide stated pt had good oral care just prior to ST) but did not inhibit intelligibility. See skilled intervention for details of voicing, functional speech intelligilibty for imitation and spontaneous production of 1-4+ syllable words/phrases. Recommend continued direct skilled ST to address pt's decline in communication skills, and to monitor a HEP for dysphagia and dysarthria in order to maximize functional communication, secretion management/ swallowing safety, reduce caregiver burden and improve quality of life.    Speech Therapy Frequency  2x / week    Treatment/Interventions  Aspiration precaution training;Trials of upgraded texture/liquids;Compensatory strategies;Functional tasks;Patient/family education;Cueing hierarchy;Diet toleration management by SLP;Cognitive reorganization;Compensatory techniques;SLP instruction and feedback;Internal/external aids;Multimodal communcation approach;Pharyngeal strengthening exercises;Oral motor exercises    Potential to Achieve Goals  Fair    Potential Considerations  Ability to learn/carryover information;Co-morbidities;Severity of  impairments;Cooperation/participation level;Previous level of function    Consulted and Agree with Plan of Care  Patient;Family member/caregiver    Family Member Consulted  Husband, [name]       Patient will benefit from skilled therapeutic intervention in order to improve the following deficits and impairments:   Dysarthria and anarthria  Cognitive communication deficit  Aphasia  Dysphagia, oropharyngeal phase    Problem List Patient Active Problem List   Diagnosis Date Noted  . Coagulase negative Staphylococcus bacteremia 02/09/2017  . Abdominal distension   . Chronic mucus hypersecretion, respiratory 12/31/2016  . Severe protein-calorie malnutrition [name]: less than 60% of standard weight) ([place]) 12/31/2016  . Acute urinary retention 12/31/2016  . Constipation 12/31/2016  . Shortness of breath   . Respiratory distress   . Pneumonia 12/05/2016  . Acute respiratory failure ([place]) 12/05/2016  . Pseudomonas urinary tract infection 10/27/2016  . Severe muscle deconditioning 09/30/2016  . Pressure injury of skin 09/28/2016  . Respiratory failure ([place])   . Tracheobronchitis   . Urinary retention   . PEG (percutaneous endoscopic gastrostomy) status ([place])   . Benign essential HTN   . Hyponatremia   . Hypokalemia   . Reactive hypertension   . Respiratory insufficiency 09/24/2016  . Malignant neoplasm of upper-inner quadrant of left breast in female, estrogen receptor positive ([place]) 07/14/2016  . Heterotopic ossification of bone 03/10/2016  . Acute on chronic respiratory failure with hypoxia ([place])   . Acute hypoxemic respiratory failure ([place])   . Acute blood loss anemia   . Atelectasis 02/04/2016  . Debility 02/03/2016  . Pneumonia of both lower lobes due to Pseudomonas species ([place])   . Pneumonia of both lower lobes due  to methicillin susceptible Staphylococcus aureus (MSSA) ([place])   . Acute respiratory failure with hypoxia ([place])   . Acute encephalopathy   . Seizures ([place])    . Sepsis ([place])   . Hypoxia   . Pain   . Chronic respiratory failure ([place]) 12/28/2015  . Acute tracheobronchitis 12/24/2015  . Tracheostomy tube present ([place])   . Tracheal stenosis   . Chronic respiratory failure with hypoxia ([place])   . Spastic tetraplegia ([place]) 10/20/2015  . Tracheostomy status ([place]) 09/26/2015  . Allergic rhinitis 09/26/2015  . 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 Movement disorder 09/22/2015  . Encephalitis 09/22/2015  . Chest pain 02/07/2014  . Premature atrial contractions 01/13/2014  . Abnormality of gait 12/04/2013  . Hyperlipidemia 12/21/2011  . Cardiovascular risk factor 12/21/2011  . Bunion 01/31/2008  . Metatarsalgia of both feet 09/20/2007  . FLAT FOOT 09/20/2007  . HALLUX RIGIDUS, ACQUIRED 09/20/2007    [facility] ,MS, CCC-SLP  06/13/2017, 11:27 AM  [facility] [address] [place], [place], [address] Phone: [phone]   Fax:  [phone]   Name: Judy Spears MRN: [id number] Date of Birth: 10-09-1950

## 2017-06-14 ENCOUNTER — Encounter: Payer: Self-pay | Admitting: Occupational Therapy

## 2017-06-14 ENCOUNTER — Ambulatory Visit: Payer: Medicare Other | Admitting: Occupational Therapy

## 2017-06-14 ENCOUNTER — Ambulatory Visit: Payer: Medicare Other

## 2017-06-14 ENCOUNTER — Ambulatory Visit: Payer: Medicare Other | Admitting: Physical Therapy

## 2017-06-14 DIAGNOSIS — M25622 Stiffness of left elbow, not elsewhere classified: Secondary | ICD-10-CM

## 2017-06-14 DIAGNOSIS — R2681 Unsteadiness on feet: Secondary | ICD-10-CM

## 2017-06-14 DIAGNOSIS — M25611 Stiffness of right shoulder, not elsewhere classified: Secondary | ICD-10-CM

## 2017-06-14 DIAGNOSIS — R293 Abnormal posture: Secondary | ICD-10-CM

## 2017-06-14 DIAGNOSIS — M6281 Muscle weakness (generalized): Secondary | ICD-10-CM

## 2017-06-14 DIAGNOSIS — R41844 Frontal lobe and executive function deficit: Secondary | ICD-10-CM

## 2017-06-14 DIAGNOSIS — M25612 Stiffness of left shoulder, not elsewhere classified: Secondary | ICD-10-CM

## 2017-06-14 DIAGNOSIS — R41841 Cognitive communication deficit: Secondary | ICD-10-CM

## 2017-06-14 DIAGNOSIS — R29818 Other symptoms and signs involving the nervous system: Secondary | ICD-10-CM

## 2017-06-14 DIAGNOSIS — R1312 Dysphagia, oropharyngeal phase: Secondary | ICD-10-CM

## 2017-06-14 DIAGNOSIS — R2689 Other abnormalities of gait and mobility: Secondary | ICD-10-CM

## 2017-06-14 DIAGNOSIS — R4701 Aphasia: Secondary | ICD-10-CM

## 2017-06-14 DIAGNOSIS — R471 Dysarthria and anarthria: Secondary | ICD-10-CM | POA: Diagnosis not present

## 2017-06-14 DIAGNOSIS — G8250 Quadriplegia, unspecified: Secondary | ICD-10-CM

## 2017-06-14 NOTE — Patient Instructions (Addendum)
LIP and TONGUE exercises  --- DO ALL EXERCISES TWICE EACH DAY  1. Lip press - Press your lips together firmly (like making a /m/ sound) and hold 5 seconds -repeat 10 times  2. LOUD and LONG [name] - make a kissing sound as loud as you can, as long as you can - repeat 10 times  3. Tongue sweep - Sweep your tongue in the pockets of your mouth - touch every tooth  - five revolutions each way  4. PUH! - 10 times       MAKE THE SOUNDS STRONG      TUH - 10 times      KUH! - 10 times  5. Say "OOOOO", and "EEEEE" 10 times (pucker and smile)  6. Put air in your cheeks, and push on either side 5-10 times  *KEEP THE AIR IN and DON'T LET IT ESCAPE!!*  May need to modify this one at first and try more often as Judy Spears succeeds with lip press/strength exercises (exercises 1-2, 5)  7. Blow hard! 10 times

## 2017-06-14 NOTE — Therapy (Signed)
[facility] [address] [place], [place], [address] Phone: [phone]   Fax:  [phone]  Speech Language Pathology Treatment  Patient Details  Name: Judy Spears MRN: [id number] Date of Birth: 1950/09/08 Referring Provider: Levada Schilling, MD   Encounter Date: 06/14/2017  End of Session - 06/14/17 1727    Visit Number  9    Number of Visits  17    Date for SLP Re-Evaluation  07/22/17    SLP Start Time  0937    SLP Stop Time   1017    SLP Time Calculation (min)  40 min    Activity Tolerance  Patient tolerated treatment well       Past Medical History:  Diagnosis Date  . Arthritis    lt great toe  . Complication of anesthesia    pt has had headaches post op-did advise to hydra well preop  . Hair loss    right-sided  . History of exercise stress test    a. ETT (12/15) with 12:00 exercise, no ST changes, occasional PACs.   . Hyperlipidemia   . Insomnia   . Migraine headache   . Movement disorder    resltess in left legs  . Postmenopausal   . 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 Premature atrial contractions    a. Holter (12/15) with 8% PACs, no atrial runs or atrial fibrillation.     Past Surgical History:  Procedure Laterality Date  . BREAST BIOPSY  01/01/2011   Procedure: BREAST BIOPSY WITH NEEDLE LOCALIZATION;  Surgeon: Rolm Bookbinder, MD;  Location: [facility];  Service: General;  Laterality: Left;  left breast wire localization biopsy  . BREAST BIOPSY Right 07/27/2012  . BREAST BIOPSY Right 07/07/2016  . BREAST BIOPSY Left 06/30/2016  . cataracts right eye    . CESAREAN SECTION  1986  . HERNIA REPAIR  2000   RIH  . INTRAMEDULLARY (IM) NAIL INTERTROCHANTERIC Left 01/30/2017   Procedure: ORIF AFFIXUS NAIL;  Surgeon: Paralee Cancel, MD;  Location: [facility];  Service: Orthopedics;  Laterality: Left;  . IR REPLC GASTRO/COLONIC TUBE PERCUT W/FLUORO  09/29/2016  . opirectinal membrane  peel    . PEG PLACEMENT    . RHINOPLASTY  1983  . TRACHEOSTOMY    . TRACHEOSTOMY TUBE PLACEMENT N/A 12/15/2016   Procedure: TRACHEOSTOMY CHANGE;  Surgeon: [name] Montane, MD;  Location: [facility];  Service: ENT;  Laterality: N/A;    There were no vitals filed for this visit.  Subjective Assessment - 06/14/17 1038    Subjective  Pt alert and awake upon entry into ST today. Mild hydrophonic voice.    Patient is accompained by:  Family member Husband and aide    Currently in Pain?  No/denies            ADULT SLP TREATMENT - 06/14/17 1039      General Information   Behavior/Cognition  Cooperative;Pleasant mood;Distractible;Requires cueing;Lethargic      Treatment Provided   Treatment provided  Cognitive-Linquistic      Cognitive-Linquistic Treatment   Treatment focused on  Dysarthria;Patient/family/caregiver education    Skilled Treatment  SLP introduced pt/family and cargiver to simple HEP for dysarthria. Pt req'd varied cueing levels for completion depending upon previous work on the exercise ("oooo" - "eeeee" req'd min cues due to pt routinely doing this exercise with aid, while lip press and verbal exercises ("puh", "tuh", "kuh") req'd consistent mod to max A for procedure as well as for  greater amplitude of movement of articulators. Pt benefitted somewhat from tactile facial (labial placement for /p/, and for lip press, and cheek placement for labial rounding exercises) and intraoral cues (for /t/, /k/ placement). Aide showed excellent awareness of requirements for each exercise and cued pt appropriately, after SLP initial explanation and demonstration of exercise. Aide also took notes. SLP suggested 5 reps of each exericse and pt rest for a short time, then complete 5 more. For puff cheeks exericse, pt unable to put air into cheeks so SLP suggested family monitor pt's ability to seal lips and add this exercise in when consistent success seen with sealing lips. Tactile, auditory cues were  necessary to arouse pt from frequent somnolence after approx 6-7 mintues of ST.      Assessment / Recommendations / Plan   Plan  Continue with current plan of care      Progression Toward Goals   Progression toward goals  Progressing toward goals insomuch as pt now has HEP for dysarthria       SLP Education - 06/14/17 1726    Education provided  Yes    Education Details  HEP    Person(s) Educated  Patient;Spouse;Caregiver(s)    Methods  Explanation;Demonstration;Tactile cues;Verbal cues mirror   mirror   Comprehension  Verbalized understanding;Need further instruction;Returned demonstration;Verbal cues required;Tactile cues required       SLP Short Term Goals - 06/14/17 1729      SLP SHORT TERM GOAL #1   Title  Pt will demonstrate dysarthria HEP to increase articulatory coordination for speech with usual mod A over 3 sessions    Time  1    Period  Weeks    Status  On-going      SLP SHORT TERM GOAL #2   Title  Pt will achieve phonation adequate for SLP intelligibility in imitated two-syllable words using intelligibility compensations 12/20 trials with usual min A in 3 sessions    Time  1    Period  Weeks    Status  On-going      SLP SHORT TERM GOAL #3   Title  Pt will imitate 2-syllable words or phrases with functional intelligibility for SLP in 5/15 trials over 3 sessions     Time  1    Period  Weeks    Status  On-going      SLP SHORT TERM GOAL #4   Title  Pt will sustain attention to therapy tasks for 10 minutes with multimodal cues in 5 sessions    Baseline  05-23-17, 06-10-17    Time  1    Period  Weeks    Status  On-going      SLP SHORT TERM GOAL #5   Title  pt family will demo understanding of oral care regimen, including safe ingestion of water/ice chips over three sessions    Time  1    Period  Weeks    Status  On-going      SLP SHORT TERM GOAL #6   Title  pt will demo dysphagia HEP to improve management of secretions and safety with any possible POs     Time  1    Period  Weeks    Status  On-going      SLP SHORT TERM GOAL #7   Title  family/caregivers will demo understanding of pt swallowing precautions, for pt safety with POs, by verbal report     Status  Achieved on 06-10-17 [name] indicating prior to PO H2O to  clear secretions that pt had just had thorough oral care prior to session        SLP Long Term Goals - 06/14/17 1731      SLP LONG TERM GOAL #1   Title  Pt will demo dysarthria  HEP to incr articulatory coordination for speech, with occasional mod A over three sessions    Time  55    Period  Weeks    Status  On-going      SLP LONG TERM GOAL #2   Title  pt will achieve phonation adequate for SLP intelligibility in imitated 3-syllable words/3-word phrases using intelligibility compenastions 15/20 trials    Time  5    Period  Weeks    Status  On-going      SLP LONG TERM GOAL #3   Title  Pt will imitate 2-syllable words or phrases with functional intelligibility for SLP in 8/15 trials over three sessions    Time  5    Period  Weeks    Status  On-going      SLP LONG TERM GOAL #4   Title  pt will sustain attention to therapy tasks for 10 minutes with multimodal cues in 10 sessions    Baseline  06-10-17    Time  5    Period  Weeks    Status  On-going      SLP LONG TERM GOAL #5   Title  pt will demo dysphagia HEP to improve management of secretions and safety with possible POs    Time  5    Period  Weeks    Status  On-going       Plan - 06/14/17 1728    Clinical Impression Statement  Patient with persistent deficits in speech intelligibility (dysarthria, reduced breath support), cognitive communication and swallowing. Pt session was somewhat limited by frequent lethargy/somnolence/decr'd alertness; tactile and verbal cues were req'd throughout session after 6-7 minutes of ST. See skilled intervention for further details. Recommend direct skilled ST to address pt's decline in communication skills, and to monitor a HEP for  dysphagia and dysarthria in order to maximize functional communication, secretion management/ swallowing safety, reduce caregiver burden and improve quality of life.     Speech Therapy Frequency  2x / week    Treatment/Interventions  Aspiration precaution training;Trials of upgraded texture/liquids;Compensatory strategies;Functional tasks;Patient/family education;Cueing hierarchy;Diet toleration management by SLP;Cognitive reorganization;Compensatory techniques;SLP instruction and feedback;Internal/external aids;Multimodal communcation approach;Pharyngeal strengthening exercises;Oral motor exercises    Potential to Achieve Goals  Fair    Potential Considerations  Ability to learn/carryover information;Co-morbidities;Severity of impairments;Cooperation/participation level;Previous level of function    Consulted and Agree with Plan of Care  Patient;Family member/caregiver    Family Member Consulted  Husband, [name]       Patient will benefit from skilled therapeutic intervention in order to improve the following deficits and impairments:   Dysarthria and anarthria  Dysphagia, oropharyngeal phase  Cognitive communication deficit  Aphasia    Problem List Patient Active Problem List   Diagnosis Date Noted  . Coagulase negative Staphylococcus bacteremia 02/09/2017  . Abdominal distension   . Chronic mucus hypersecretion, respiratory 12/31/2016  . Severe protein-calorie malnutrition [name]: less than 60% of standard weight) ([place]) 12/31/2016  . Acute urinary retention 12/31/2016  . Constipation 12/31/2016  . Shortness of breath   . Respiratory distress   . Pneumonia 12/05/2016  . Acute respiratory failure ([place]) 12/05/2016  . Pseudomonas urinary tract infection 10/27/2016  . Severe muscle deconditioning 09/30/2016  . Pressure injury  of skin 09/28/2016  . Respiratory failure ([place])   . Tracheobronchitis   . Urinary retention   . PEG (percutaneous endoscopic gastrostomy) status ([place])   .  Benign essential HTN   . Hyponatremia   . Hypokalemia   . Reactive hypertension   . Respiratory insufficiency 09/24/2016  . Malignant neoplasm of upper-inner quadrant of left breast in female, estrogen receptor positive ([place]) 07/14/2016  . Heterotopic ossification of bone 03/10/2016  . Acute on chronic respiratory failure with hypoxia ([place])   . Acute hypoxemic respiratory failure ([place])   . Acute blood loss anemia   . Atelectasis 02/04/2016  . Debility 02/03/2016  . Pneumonia of both lower lobes due to Pseudomonas species ([place])   . Pneumonia of both lower lobes due to methicillin susceptible Staphylococcus aureus (MSSA) ([place])   . Acute respiratory failure with hypoxia ([place])   . Acute encephalopathy   . Seizures ([place])   . Sepsis ([place])   . Hypoxia   . Pain   . Chronic respiratory failure ([place]) 12/28/2015  . Acute tracheobronchitis 12/24/2015  . Tracheostomy tube present ([place])   . Tracheal stenosis   . Chronic respiratory failure with hypoxia ([place])   . Spastic tetraplegia ([place]) 10/20/2015  . Tracheostomy status ([place]) 09/26/2015  . Allergic rhinitis 09/26/2015  . 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 Movement disorder 09/22/2015  . Encephalitis 09/22/2015  . Chest pain 02/07/2014  . Premature atrial contractions 01/13/2014  . Abnormality of gait 12/04/2013  . Hyperlipidemia 12/21/2011  . Cardiovascular risk factor 12/21/2011  . Bunion 01/31/2008  . Metatarsalgia of both feet 09/20/2007  . FLAT FOOT 09/20/2007  . HALLUX RIGIDUS, ACQUIRED 09/20/2007    [facility] ,[place], CCC-SLP  06/14/2017, 5:31 PM  [facility] [address] [place], [place], [address] Phone: [phone]   Fax:  [phone]   Name: Judy Spears MRN: [id number] Date of Birth: Mar 10, 1950

## 2017-06-14 NOTE — Therapy (Signed)
[facility] [address] [place], [place], [address] Phone: [phone]   Fax:  [phone]  Occupational Therapy Treatment  Patient Details  Name: Judy Spears MRN: [id number] Date of Birth: 26-Jul-1950 Referring Provider: Dr. Levada Schilling   Encounter Date: 06/14/2017  OT End of Session - 06/14/17 1306    Visit Number  9    Number of Visits  16    Date for OT Re-Evaluation  07/05/17    Authorization Type  medicare     Authorization Time Period  90 days    OT Start Time  1015    OT Stop Time  1100    OT Time Calculation (min)  45 min    Activity Tolerance  Patient tolerated treatment well       Past Medical History:  Diagnosis Date  . Arthritis    lt great toe  . Complication of anesthesia    pt has had headaches post op-did advise to hydra well preop  . Hair loss    right-sided  . History of exercise stress test    a. ETT (12/15) with 12:00 exercise, no ST changes, occasional PACs.   . Hyperlipidemia   . Insomnia   . Migraine headache   . Movement disorder    resltess in left legs  . Postmenopausal   . 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 Premature atrial contractions    a. Holter (12/15) with 8% PACs, no atrial runs or atrial fibrillation.     Past Surgical History:  Procedure Laterality Date  . BREAST BIOPSY  01/01/2011   Procedure: BREAST BIOPSY WITH NEEDLE LOCALIZATION;  Surgeon: Rolm Bookbinder, MD;  Location: [facility];  Service: General;  Laterality: Left;  left breast wire localization biopsy  . BREAST BIOPSY Right 07/27/2012  . BREAST BIOPSY Right 07/07/2016  . BREAST BIOPSY Left 06/30/2016  . cataracts right eye    . CESAREAN SECTION  1986  . HERNIA REPAIR  2000   RIH  . INTRAMEDULLARY (IM) NAIL INTERTROCHANTERIC Left 01/30/2017   Procedure: ORIF AFFIXUS NAIL;  Surgeon: Paralee Cancel, MD;  Location: [facility];  Service: Orthopedics;  Laterality: Left;  . IR REPLC  GASTRO/COLONIC TUBE PERCUT W/FLUORO  09/29/2016  . opirectinal membrane peel    . PEG PLACEMENT    . RHINOPLASTY  1983  . TRACHEOSTOMY    . TRACHEOSTOMY TUBE PLACEMENT N/A 12/15/2016   Procedure: TRACHEOSTOMY CHANGE;  Surgeon: [name] Montane, MD;  Location: [facility];  Service: ENT;  Laterality: N/A;    There were no vitals filed for this visit.  Subjective Assessment - 06/14/17 1022    Subjective   I like the structure here (outpatient therapy)    Patient is accompained by:  Family member husband [name]  caregiver [name]    Pertinent History  see epic snapshot - ABI/encephalitis/hypoxia from virus; recent hospitalization due to MRSA PNA     Patient Stated Goals  Pt unable to state.     Currently in Pain?  No/denies                   OT Treatments/Exercises (OP) - 06/14/17 0001      ADLs   ADL Comments  Met with pt, husband and primary caregiver today to review current STG's, progress toward goals and future plan of therapy.  Pt has met 1 STG at this time.  Pt has improved in basic trunk control, postural aligment in sitting and standing, as  well as inconsistent improvement in sitting and standing balance.  Pt continues to work toward pre skills related to basic sitting and standing balance goals as well as basic functional mobility to reduce caregiver burden. Progress has been very slow and is impacted by pt's very complex medical history. Will continue to work toward basic skills as outlined in goals; pt, husband and caregiver are aware of very slow progress.  Will reassess at end of this POC to make determination of further OT services at that time.               OT Education - 06/14/17 1304    Education provided  Yes    Education Details  updated pt, husband and cargiver on current progress, status and future overall POC    Person(s) Educated  Patient;Spouse;Caregiver(s)    Methods  Explanation       OT Short Term Goals - 06/14/17 1305      OT SHORT TERM GOAL #1    Title  Pt will be mod a for sit to stand from toilet in steady to reduce care giver burden. - 06/07/2017    Status  On-going      OT SHORT TERM GOAL #2   Title  Pt will sit on level surface with 1 UE support only in preparation for participation in basic self care tasks for at least 3 minutes consistently    Status  On-going      OT SHORT TERM GOAL #3   Title  Pt will maintain static standing balance with BUE support during ADL activity  with mod a for at least 3 minutes consistently    Status  On-going      OT SHORT TERM GOAL #4   Title  Pt will roll during LB dressing with max a x1 to reduce caregiver burden.     Status  On-going      OT SHORT TERM GOAL #5   Title  Pt will demonstrate ability to follow simple, functional commands in context at least 50% of the time.     Status  Achieved        OT Long Term Goals - 06/14/17 1305      OT LONG TERM GOAL #1   Title  Pt will go from sit to stand from toilet with steady with min a to reduce caregiver burden - 07/05/2017    Status  On-going      OT LONG TERM GOAL #2   Title  Pt will sit on level surfaces for participation in basic self care tasks with no UE support for at least 5 minutes    Status  On-going      OT LONG TERM GOAL #3   Title  Pt will maintain static standing balance with BUE support duirng ADL activity with min a for at least 3 minutes    Status  On-going      OT LONG TERM GOAL #4   Title  Pt wil roll during LB dressing with mod a x1 to reduce caregiver burden.      Status  On-going            Plan - 06/14/17 1305    Clinical Impression Statement  Pt with very low progress and treatment focused on pre skill improvement for basic mobility to reduce caregiver burden.     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    Occupational performance deficits (Please refer to evaluation for details):  ADL's;IADL's;Rest and Sleep;Leisure;Social Participation;Work    Physicist, medical    Current Impairments/barriers affecting progress:  severity of deficits, very slow rate of recovery; pt with recent significant decline over past several months due medical complications    OT Frequency  2x / week    OT Duration  8 weeks    OT Treatment/Interventions  Self-care/ADL training;Ultrasound;Moist Heat;Electrical Stimulation;DME and/or AE instruction;Neuromuscular education;Therapeutic exercise;Functional Mobility Training;Manual Therapy;Passive range of motion;Splinting;Therapeutic activities;Balance training;Patient/family education;Visual/perceptual remediation/compensation;Cognitive remediation/compensation    Plan   review splints from prior episodes, NMR trunk and LE's for transitional movements, static standing, sitting balance, sit to stand, stand to sit, stepping    Consulted and Agree with Plan of Care  Patient;Family member/caregiver    Family Member Consulted  husband [name] and caregiver Leesa       Patient will benefit from skilled therapeutic intervention in order to improve the following deficits and impairments:  Decreased endurance, Decreased skin integrity, Impaired vision/preception, Improper body mechanics, Impaired perceived functional ability, Decreased knowledge of precautions, Cardiopulmonary status limiting activity, Decreased activity tolerance, Decreased knowledge of use of DME, Decreased strength, Impaired flexibility, Improper spinal/pelvic alignment, Impaired sensation, Difficulty walking, Decreased mobility, Decreased balance, Decreased cognition, Decreased range of motion, Impaired tone, Impaired UE functional use, Decreased safety awareness, Decreased coordination, Pain, Abnormal gait  Visit Diagnosis: Muscle weakness (generalized)  Abnormal posture  Unsteadiness on  feet  Other abnormalities of gait and mobility  Other symptoms and signs involving the nervous system  Frontal lobe and executive function deficit  Stiffness of left elbow, not elsewhere classified  Stiffness of left shoulder, not elsewhere classified  Stiffness of right shoulder, not elsewhere classified  Spastic tetraplegia (HCC)    Problem List Patient Active Problem List   Diagnosis Date Noted  . Coagulase negative Staphylococcus bacteremia 02/09/2017  . Abdominal distension   . Chronic mucus hypersecretion, respiratory 12/31/2016  . Severe protein-calorie malnutrition [name]: less than 60% of standard weight) ([facility]) 12/31/2016  . Acute urinary retention 12/31/2016  . Constipation 12/31/2016  . Shortness of breath   . Respiratory distress   . Pneumonia 12/05/2016  . Acute respiratory failure ([facility]) 12/05/2016  . Pseudomonas urinary tract infection 10/27/2016  . Severe muscle deconditioning 09/30/2016  . Pressure injury of skin 09/28/2016  . Respiratory failure ([facility])   . Tracheobronchitis   . Urinary retention   . PEG (percutaneous endoscopic gastrostomy) status ([facility])   . Benign essential HTN   . Hyponatremia   . Hypokalemia   . Reactive hypertension   . Respiratory insufficiency 09/24/2016  . Malignant neoplasm of upper-inner quadrant of left breast in female, estrogen receptor positive ([facility]) 07/14/2016  . Heterotopic ossification of bone 03/10/2016  . Acute on chronic respiratory failure with hypoxia ([facility])   . Acute hypoxemic respiratory failure ([facility])   . Acute blood loss anemia   . Atelectasis 02/04/2016  . Debility 02/03/2016  . Pneumonia of both lower lobes due to Pseudomonas species ([facility])   . Pneumonia of both lower lobes due to methicillin susceptible Staphylococcus aureus (MSSA) ([facility])   . Acute respiratory failure with hypoxia ([facility])   . Acute encephalopathy   . Seizures ([facility])   . Sepsis ([facility])   . Hypoxia   . Pain   . Chronic respiratory failure ([facility])  12/28/2015  . Acute tracheobronchitis 12/24/2015  . Tracheostomy  tube present ([facility])   . Tracheal stenosis   . Chronic respiratory failure with hypoxia ([facility])   . Spastic tetraplegia ([place]) 10/20/2015  . Tracheostomy status ([facility]) 09/26/2015  . Allergic rhinitis 09/26/2015  . 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 Movement disorder 09/22/2015  . Encephalitis 09/22/2015  . Chest pain 02/07/2014  . Premature atrial contractions 01/13/2014  . Abnormality of gait 12/04/2013  . Hyperlipidemia 12/21/2011  . Cardiovascular risk factor 12/21/2011  . Bunion 01/31/2008  . Metatarsalgia of both feet 09/20/2007  . FLAT FOOT 09/20/2007  . HALLUX RIGIDUS, ACQUIRED 09/20/2007    Quay Burow, OTR/L 06/14/2017, 1:07 PM  [facility] [address] [place], [place], [address] Phone: [phone]   Fax:  [phone]  Name: Nyeli Holtmeyer MRN: [id number] Date of Birth: 1950/07/27

## 2017-06-15 ENCOUNTER — Encounter: Payer: Self-pay | Admitting: Physical Therapy

## 2017-06-15 NOTE — Therapy (Signed)
[facility] [address] [place], [place], [address] Phone: [phone]   Fax:  [phone]  Physical Therapy Treatment  Patient Details  Name: Judy Spears MRN: [id number] Date of Birth: [birth year] Referring Provider: Dr. Levada Schilling   Encounter Date: 06/14/2017  PT End of Session - 06/15/17 1359    Visit Number  8    Number of Visits  17    Date for PT Re-Evaluation  07/09/17    Authorization Type  [employer] Medicare    Authorization Time Period  05-10-17 - 08-08-17    PT Start Time  1106    PT Stop Time  1148    PT Time Calculation (min)  42 min       Past Medical History:  Diagnosis Date  . Arthritis    lt great toe  . Complication of anesthesia    pt has had headaches post op-did advise to hydra well preop  . Hair loss    right-sided  . History of exercise stress test    a. ETT (12/15) with 12:00 exercise, no ST changes, occasional PACs.   . Hyperlipidemia   . Insomnia   . Migraine headache   . Movement disorder    resltess in left legs  . Postmenopausal   . 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 Premature atrial contractions    a. Holter (12/15) with 8% PACs, no atrial runs or atrial fibrillation.     Past Surgical History:  Procedure Laterality Date  . BREAST BIOPSY  01/01/2011   Procedure: BREAST BIOPSY WITH NEEDLE LOCALIZATION;  Surgeon: Rolm Bookbinder, MD;  Location: [facility];  Service: General;  Laterality: Left;  left breast wire localization biopsy  . BREAST BIOPSY Right 07/27/2012  . BREAST BIOPSY Right 07/07/2016  . BREAST BIOPSY Left 06/30/2016  . cataracts right eye    . CESAREAN SECTION  1986  . HERNIA REPAIR  2000   RIH  . INTRAMEDULLARY (IM) NAIL INTERTROCHANTERIC Left 01/30/2017   Procedure: ORIF AFFIXUS NAIL;  Surgeon: Paralee Cancel, MD;  Location: [facility];  Service: Orthopedics;  Laterality: Left;  . IR REPLC GASTRO/COLONIC TUBE PERCUT W/FLUORO  09/29/2016  .  opirectinal membrane peel    . PEG PLACEMENT    . RHINOPLASTY  1983  . TRACHEOSTOMY    . TRACHEOSTOMY TUBE PLACEMENT N/A 12/15/2016   Procedure: TRACHEOSTOMY CHANGE;  Surgeon: [name] Montane, MD;  Location: [facility];  Service: ENT;  Laterality: N/A;    There were no vitals filed for this visit.  Subjective Assessment - 06/15/17 1352    Subjective  Judy Spears reports pt did Yoga with a friend who came over to instruct her - included breathing techniques which helped in addition to the Yoga for stretching     Patient is accompained by:  Family member    Pertinent History  Pow[place]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  No/denies                       [facility] Adult PT Treatment/Exercise - 06/15/17 0001      Transfers   Transfers  Sit to Stand;Stand to Sit    Sit to Stand  3: Mod assist;Multiple attempts    Sit to Stand Details (indicate cue type and reason)  from wheelchair and from mat table  Stand to Sit  4: Min assist    Stand to Sit Details (indicate cue type and reason)  Tactile cues for sequencing;Tactile cues for weight shifting;Tactile cues for posture;Tactile cues for placement;Verbal cues for sequencing;Verbal cues for technique;Manual facilitation for weight shifting;Manual facilitation for placement;Manual facilitation for weight bearing    Stand Pivot Transfers  2: Max assist    Stand Pivot Transfer Details (indicate cue type and reason)  wheelchair to/from mat      Ambulation/Gait   Ambulation/Gait  Yes    Ambulation/Gait Assistance  2: Max assist    Ambulation Distance (Feet)  10 Feet 2 reps inside // bars with RUE support    Assistive device  1 person hand held assist;Parallel bars 2nd person assisted in advancing LLE    Gait Pattern  Step-to pattern;Step-through pattern;Decreased stride length;Decreased dorsiflexion - right;Decreased dorsiflexion - left;Decreased  weight shift to left;Ataxic;Trunk flexed;Narrow base of support;Poor foot clearance - left      Knee/Hip Exercises: Stretches   Passive Hamstring Stretch Limitations  with blue wedge/pillows under trunk/head.      Knee/Hip Exercises: Supine   Heel Slides  AROM;Both;1 set;10 reps mod to min assist        Neuro Re-ed; sitting balance unsupported on side of mat - leaning on Rt and Lt forearm with mod assist to attain position Pt able to return to upright sitting with min assist  Standing with bil UE support - weight shifts laterally in standing 5 reps with max assist        PT Short Term Goals - 06/10/17 1305      PT SHORT TERM GOAL #1   Title  Perform sit to/from stand from wheelchair or mat with +1 mod assist.    Baseline  06/09/16: met today    Status  Achieved      PT SHORT TERM GOAL #2   Title  Pt will perform sit to stand from wheelchair to RW with max assist for pre-gait skill.    Baseline  06/09/17: met today    Status  Achieved      PT SHORT TERM GOAL #3   Title  Pt will transfer supine to sit with max assist.    Baseline  06/09/17: met today, needs increased time to assist    Status  Achieved      PT SHORT TERM GOAL #4   Title  Pt will sit with RUE support with min to mod assist unsupported on side of mat for at least 3" to demo improved trunk strength and core stabilization.    Baseline  06/09/17: met today     Status  Achieved      PT SHORT TERM GOAL #5   Title  Pt will amb. 15' with appropriate assistive device with max assist +1    Baseline  06/09/17: pt now needs mod/max assist of 2 people for gait using modified 3 muskateer assist and blue rocker AFO to right LE    Status  Partially Met        PT Long Term Goals - 05/11/17 2122      PT LONG TERM GOAL #1   Title  Pt will perform supine rolling R and L with min assist  to indicate improved independence with bed mobility.      Baseline  dependent for all bed mobility per spouse's report - 05-10-17    Time   8    Period  Weeks    Status  New  Target Date  07/09/17      PT LONG TERM GOAL #2   Title  Pt will sit on side of mat with RUE support for at least 5" with SBA     Baseline  max assist to min assist - ability varies    Time  8    Period  Weeks    Status  New    Target Date  07/09/17      PT LONG TERM GOAL #3   Title  Pt will amb. 20' with appropriate assistive device with +1 max assist.    Baseline  +2 max - total assist- pt unable to advance LE's - feet not clearing due to pt not wearing AFO's - 05-10-17    Time  8    Period  Weeks    Status  New    Target Date  07/09/17      PT LONG TERM GOAL #4   Title  Pt will perform basic transfers with min to mod assist to indicate improved functional independence.      Baseline  max assist - 05-10-17    Time  8    Period  Weeks    Target Date  07/09/17      PT LONG TERM GOAL #5   Title  Pt will perform static standing x 3" with bil. UE support with mod assist.      Time  8    Period  Weeks    Status  New    Target Date  07/09/17      Additional Long Term Goals   Additional Long Term Goals  Yes      PT LONG TERM GOAL #6   Title  Pt will be able to return to participation in therapeutic horseback riding at [facility] for ongoing exercise program.    Time  8    Period  Weeks    Status  New    Target Date  07/09/17            Plan - 06/15/17 1401    Clinical Impression Statement  Pt had difficulty advancing LLE in swing through phase of gait with gait training inside // bars;  pt unable to stand erect and continues to lean forward - able to increase erect posture with cues.  Pt was fatigued at end of session, especially after 2nd rep of gait training.      Rehab Potential  Fair    Clinical Impairments Affecting Rehab Potential  severity of deficits with previous extensive therapies    PT Frequency  2x / week    PT Duration  8 weeks    PT Treatment/Interventions  ADLs/Self Care Home Management;Therapeutic  exercise;Therapeutic activities;Functional mobility training;Gait training;Balance training;Neuromuscular re-education;Patient/family education;Orthotic Fit/Training;Passive range of motion    PT Next Visit Plan   continued to address transfers, standing balance and pre gait    PT Home Exercise Plan  caregiver and husband have previously been instructed in ROM, sitting balance, and transfer training    Consulted and Agree with Plan of Care  Patient       Patient will benefit from skilled therapeutic intervention in order to improve the following deficits and impairments:  Abnormal gait, Decreased activity tolerance, Decreased balance, Decreased cognition, Decreased coordination, Decreased mobility, Decreased range of motion, Decreased endurance, Hypomobility, Decreased strength, Impaired flexibility, Increased muscle spasms, Impaired UE functional use, Postural dysfunction, Pain  Visit Diagnosis: Muscle weakness (generalized)  Other abnormalities of gait and mobility  Abnormal posture     Problem List Patient Active Problem List   Diagnosis Date Noted  . Coagulase negative Staphylococcus bacteremia 02/09/2017  . Abdominal distension   . Chronic mucus hypersecretion, respiratory 12/31/2016  . Severe protein-calorie malnutrition [name]: less than 60% of standard weight) ([facility]) 12/31/2016  . Acute urinary retention 12/31/2016  . Constipation 12/31/2016  . Shortness of breath   . Respiratory distress   . Pneumonia 12/05/2016  . Acute respiratory failure ([place]) 12/05/2016  . Pseudomonas urinary tract infection 10/27/2016  . Severe muscle deconditioning 09/30/2016  . Pressure injury of skin 09/28/2016  . Respiratory failure ([place])   . Tracheobronchitis   . Urinary retention   . PEG (percutaneous endoscopic gastrostomy) status ([facility])   . Benign essential HTN   . Hyponatremia   . Hypokalemia   . Reactive hypertension   . Respiratory insufficiency 09/24/2016  . Malignant neoplasm of  upper-inner quadrant of left breast in female, estrogen receptor positive ([facility]) 07/14/2016  . Heterotopic ossification of bone 03/10/2016  . Acute on chronic respiratory failure with hypoxia ([place])   . Acute hypoxemic respiratory failure ([facility])   . Acute blood loss anemia   . Atelectasis 02/04/2016  . Debility 02/03/2016  . Pneumonia of both lower lobes due to Pseudomonas species ([facility])   . Pneumonia of both lower lobes due to methicillin susceptible Staphylococcus aureus (MSSA) ([place])   . Acute respiratory failure with hypoxia ([place])   . Acute encephalopathy   . Seizures ([place])   . Sepsis ([place])   . Hypoxia   . Pain   . Chronic respiratory failure ([place]) 12/28/2015  . Acute tracheobronchitis 12/24/2015  . Tracheostomy tube present ([place])   . Tracheal stenosis   . Chronic respiratory failure with hypoxia ([facility])   . Spastic tetraplegia ([facility] [facility]) 10/20/2015  . Tracheostomy status ([place]) 09/26/2015  . Allergic rhinitis 09/26/2015  . 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 Movement disorder 09/22/2015  . Encephalitis 09/22/2015  . Chest pain 02/07/2014  . Premature atrial contractions 01/13/2014  . Abnormality of gait 12/04/2013  . Hyperlipidemia 12/21/2011  . Cardiovascular risk factor 12/21/2011  . Bunion 01/31/2008  . Metatarsalgia of both feet 09/20/2007  . FLAT FOOT 09/20/2007  . HALLUX RIGIDUS, ACQUIRED 09/20/2007    Alda Lea, PT 06/15/2017, 2:07 PM  [facility] [address] [place], [place], [address] Phone: [phone]   Fax:  [phone]  Name: Judy Spears MRN: [id number] Date of Birth: [birth year]

## 2017-06-16 ENCOUNTER — Encounter: Payer: Self-pay | Admitting: Occupational Therapy

## 2017-06-16 ENCOUNTER — Ambulatory Visit: Payer: Medicare Other | Admitting: Occupational Therapy

## 2017-06-16 ENCOUNTER — Ambulatory Visit: Payer: Medicare Other | Admitting: Physical Therapy

## 2017-06-16 ENCOUNTER — Ambulatory Visit: Payer: Medicare Other

## 2017-06-16 DIAGNOSIS — R29818 Other symptoms and signs involving the nervous system: Secondary | ICD-10-CM

## 2017-06-16 DIAGNOSIS — R2689 Other abnormalities of gait and mobility: Secondary | ICD-10-CM

## 2017-06-16 DIAGNOSIS — M6281 Muscle weakness (generalized): Secondary | ICD-10-CM

## 2017-06-16 DIAGNOSIS — R293 Abnormal posture: Secondary | ICD-10-CM

## 2017-06-16 DIAGNOSIS — R1312 Dysphagia, oropharyngeal phase: Secondary | ICD-10-CM

## 2017-06-16 DIAGNOSIS — R4701 Aphasia: Secondary | ICD-10-CM

## 2017-06-16 DIAGNOSIS — R2681 Unsteadiness on feet: Secondary | ICD-10-CM

## 2017-06-16 DIAGNOSIS — R471 Dysarthria and anarthria: Secondary | ICD-10-CM | POA: Diagnosis not present

## 2017-06-16 DIAGNOSIS — R41844 Frontal lobe and executive function deficit: Secondary | ICD-10-CM

## 2017-06-16 DIAGNOSIS — R41841 Cognitive communication deficit: Secondary | ICD-10-CM

## 2017-06-16 NOTE — Therapy (Signed)
[facility] [address] [place], [place], [address] Phone: [phone]   Fax:  [phone]  Occupational Therapy Treatment  Patient Details  Name: Judy Spears MRN: [id number] Date of Birth: 07-09-1950 Referring Provider: Dr. Levada Schilling   Encounter Date: 06/16/2017  OT End of Session - 06/16/17 1730    Visit Number  10    Number of Visits  16    Date for OT Re-Evaluation  07/05/17    Authorization Type  medicare     Authorization Time Period  90 days    OT Start Time  1405    OT Stop Time  1445    OT Time Calculation (min)  40 min    Activity Tolerance  Patient limited by lethargy       Past Medical History:  Diagnosis Date  . Arthritis    lt great toe  . Complication of anesthesia    pt has had headaches post op-did advise to hydra well preop  . Hair loss    right-sided  . History of exercise stress test    a. ETT (12/15) with 12:00 exercise, no ST changes, occasional PACs.   . Hyperlipidemia   . Insomnia   . Migraine headache   . Movement disorder    resltess in left legs  . Postmenopausal   . 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 Premature atrial contractions    a. Holter (12/15) with 8% PACs, no atrial runs or atrial fibrillation.     Past Surgical History:  Procedure Laterality Date  . BREAST BIOPSY  01/01/2011   Procedure: BREAST BIOPSY WITH NEEDLE LOCALIZATION;  Surgeon: Rolm Bookbinder, MD;  Location: [facility];  Service: General;  Laterality: Left;  left breast wire localization biopsy  . BREAST BIOPSY Right 07/27/2012  . BREAST BIOPSY Right 07/07/2016  . BREAST BIOPSY Left 06/30/2016  . cataracts right eye    . CESAREAN SECTION  1986  . HERNIA REPAIR  2000   RIH  . INTRAMEDULLARY (IM) NAIL INTERTROCHANTERIC Left 01/30/2017   Procedure: ORIF AFFIXUS NAIL;  Surgeon: Paralee Cancel, MD;  Location: [facility];  Service: Orthopedics;  Laterality: Left;  . IR REPLC  GASTRO/COLONIC TUBE PERCUT W/FLUORO  09/29/2016  . opirectinal membrane peel    . PEG PLACEMENT    . RHINOPLASTY  1983  . TRACHEOSTOMY    . TRACHEOSTOMY TUBE PLACEMENT N/A 12/15/2016   Procedure: TRACHEOSTOMY CHANGE;  Surgeon: [name] Montane, MD;  Location: [facility];  Service: ENT;  Laterality: N/A;    There were no vitals filed for this visit.  Subjective Assessment - 06/16/17 1725    Subjective   Patient somnolent upon arrival    Patient is accompained by:  Family member    Pertinent History  see epic snapshot - ABI/encephalitis/hypoxia from virus; recent hospitalization due to MRSA PNA     Patient Stated Goals  Pt unable to state.     Currently in Pain?  No/denies    Pain Score  0-No pain                   OT Treatments/Exercises (OP) - 06/16/17 0001      Neurological Re-education Exercises   Other Exercises 1  Patient sleeping in wheelchair upon arrival.  Caregivers providing cold cloths to face and neck to alert her to therapy.  Patient able to alert after 3-4 minutes, and initally groggy.  Caregivers stated that legs were so stiff this am  unable to complete toilet transfer in sit to stand lift.  Worked in parallel bars initally to help patient become more awake and aroused.  When standing patient, caregiver realized she was wet, used sit to stand assist to toilet and assist patient to change clothes.  Patient with very strong posterior pushing this session, even once fully awake.  Soke with husband and caregiver to watch for fever, or signs of infection as patient with significant arousal issues initally, then increased tone, which could indicate medical condition.               OT Education - 06/16/17 1730    Education provided  Yes    Education Details  watch for fever, or signs of infection    Person(s) Educated  Patient;Spouse;Caregiver(s)    Methods  Explanation    Comprehension  Verbalized understanding       OT Short Term Goals - 06/14/17 1305      OT  SHORT TERM GOAL #1   Title  Pt will be mod a for sit to stand from toilet in steady to reduce care giver burden. - 06/07/2017    Status  On-going      OT SHORT TERM GOAL #2   Title  Pt will sit on level surface with 1 UE support only in preparation for participation in basic self care tasks for at least 3 minutes consistently    Status  On-going      OT SHORT TERM GOAL #3   Title  Pt will maintain static standing balance with BUE support during ADL activity  with mod a for at least 3 minutes consistently    Status  On-going      OT SHORT TERM GOAL #4   Title  Pt will roll during LB dressing with max a x1 to reduce caregiver burden.     Status  On-going      OT SHORT TERM GOAL #5   Title  Pt will demonstrate ability to follow simple, functional commands in context at least 50% of the time.     Status  Achieved        OT Long Term Goals - 06/14/17 1305      OT LONG TERM GOAL #1   Title  Pt will go from sit to stand from toilet with steady with min a to reduce caregiver burden - 07/05/2017    Status  On-going      OT LONG TERM GOAL #2   Title  Pt will sit on level surfaces for participation in basic self care tasks with no UE support for at least 5 minutes    Status  On-going      OT LONG TERM GOAL #3   Title  Pt will maintain static standing balance with BUE support duirng ADL activity with min a for at least 3 minutes    Status  On-going      OT LONG TERM GOAL #4   Title  Pt wil roll during LB dressing with mod a x1 to reduce caregiver burden.      Status  On-going            Plan - 06/16/17 1730    Clinical Impression Statement  Patient with fluctuating progress due to varying levels of alertness and participation - expect medical causality.      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    Occupational performance deficits (Please refer to evaluation for details):  ADL's;IADL's;Rest and Sleep;Leisure;Social Participation;Work    Physicist, medical    Current Impairments/barriers affecting progress:  severity of deficits, very slow rate of recovery; pt with recent significant decline over past several months due medical complications    OT Frequency  2x / week    OT Duration  8 weeks    OT Treatment/Interventions  Self-care/ADL training;Ultrasound;Moist Heat;Electrical Stimulation;DME and/or AE instruction;Neuromuscular education;Therapeutic exercise;Functional Mobility Training;Manual Therapy;Passive range of motion;Splinting;Therapeutic activities;Balance training;Patient/family education;Visual/perceptual remediation/compensation;Cognitive remediation/compensation    Plan   review splints from prior episodes, NMR trunk and LE's for transitional movements, static standing, sitting balance, sit to stand, stand to sit, stepping    Consulted and Agree with Plan of Care  Patient;Family member/caregiver    Family Member Consulted  husband [name] and caregiver Leesa       Patient will benefit from skilled therapeutic intervention in order to improve the following deficits and impairments:  Decreased endurance, Decreased skin integrity, Impaired vision/preception, Improper body mechanics, Impaired perceived functional ability, Decreased knowledge of precautions, Cardiopulmonary status limiting activity, Decreased activity tolerance, Decreased knowledge of use of DME, Decreased strength, Impaired flexibility, Improper spinal/pelvic alignment, Impaired sensation, Difficulty walking, Decreased mobility, Decreased balance, Decreased cognition, Decreased range of motion, Impaired tone, Impaired UE functional use, Decreased safety awareness, Decreased coordination, Pain, Abnormal gait  Visit Diagnosis: Abnormal posture  Unsteadiness on feet  Other  symptoms and signs involving the nervous system  Frontal lobe and executive function deficit  Muscle weakness (generalized)    Problem List Patient Active Problem List   Diagnosis Date Noted  . Coagulase negative Staphylococcus bacteremia 02/09/2017  . Abdominal distension   . Chronic mucus hypersecretion, respiratory 12/31/2016  . Severe protein-calorie malnutrition [name]: less than 60% of standard weight) ([place]) 12/31/2016  . Acute urinary retention 12/31/2016  . Constipation 12/31/2016  . Shortness of breath   . Respiratory distress   . Pneumonia 12/05/2016  . Acute respiratory failure ([facility]) 12/05/2016  . Pseudomonas urinary tract infection 10/27/2016  . Severe muscle deconditioning 09/30/2016  . Pressure injury of skin 09/28/2016  . Respiratory failure ([facility])   . Tracheobronchitis   . Urinary retention   . PEG (percutaneous endoscopic gastrostomy) status ([facility])   . Benign essential HTN   . Hyponatremia   . Hypokalemia   . Reactive hypertension   . Respiratory insufficiency 09/24/2016  . Malignant neoplasm of upper-inner quadrant of left breast in female, estrogen receptor positive ([facility]) 07/14/2016  . Heterotopic ossification of bone 03/10/2016  . Acute on chronic respiratory failure with hypoxia ([facility])   . Acute hypoxemic respiratory failure ([facility])   . Acute blood loss anemia   . Atelectasis 02/04/2016  . Debility 02/03/2016  . Pneumonia of both lower lobes due to Pseudomonas species ([facility])   . Pneumonia of both lower lobes due to methicillin susceptible Staphylococcus aureus (MSSA) ([facility])   . Acute respiratory failure with hypoxia ([facility])   . Acute encephalopathy   . Seizures ([facility])   . Sepsis ([facility])   . Hypoxia   . Pain   . Chronic respiratory failure ([facility]) 12/28/2015  . Acute tracheobronchitis 12/24/2015  . Tracheostomy tube present ([facility])   . Tracheal stenosis   . Chronic respiratory failure with hypoxia ([name])   . Spastic tetraplegia ([facility]) 10/20/2015  . Tracheostomy  status ([facility]) 09/26/2015  . Allergic rhinitis 09/26/2015  .  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 Movement disorder 09/22/2015  . Encephalitis 09/22/2015  . Chest pain 02/07/2014  . Premature atrial contractions 01/13/2014  . Abnormality of gait 12/04/2013  . Hyperlipidemia 12/21/2011  . Cardiovascular risk factor 12/21/2011  . Bunion 01/31/2008  . Metatarsalgia of both feet 09/20/2007  . FLAT FOOT 09/20/2007  . HALLUX RIGIDUS, ACQUIRED 09/20/2007    Mariah Milling, OTR/L 06/16/2017, 5:33 PM  [facility] [address] [place], [place], [address] Phone: [phone]   Fax:  [phone]  Name: Judy Spears MRN: [id number] Date of Birth: 22-Jan-1951

## 2017-06-17 ENCOUNTER — Encounter: Payer: Self-pay | Admitting: Physical Therapy

## 2017-06-17 NOTE — Therapy (Signed)
[facility] [address] [place], [place], [address] Phone: [phone]   Fax:  [phone]  Physical Therapy Treatment  Patient Details  Name: Judy Spears MRN: [id number] Date of Birth: 1951-01-07 Referring Provider: Dr. Levada Schilling   Encounter Date: 06/16/2017  PT End of Session - 06/17/17 1726    Visit Number  9    Number of Visits  17    Date for PT Re-Evaluation  07/09/17    Authorization Type  [employer] Medicare    Authorization Time Period  05-10-17 - 08-08-17    PT Start Time  1448    PT Stop Time  1530    PT Time Calculation (min)  42 min       Past Medical History:  Diagnosis Date  . Arthritis    lt great toe  . Complication of anesthesia    pt has had headaches post op-did advise to hydra well preop  . Hair loss    right-sided  . History of exercise stress test    a. ETT (12/15) with 12:00 exercise, no ST changes, occasional PACs.   . Hyperlipidemia   . Insomnia   . Migraine headache   . Movement disorder    resltess in left legs  . Postmenopausal   . 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 Premature atrial contractions    a. Holter (12/15) with 8% PACs, no atrial runs or atrial fibrillation.     Past Surgical History:  Procedure Laterality Date  . BREAST BIOPSY  01/01/2011   Procedure: BREAST BIOPSY WITH NEEDLE LOCALIZATION;  Surgeon: Rolm Bookbinder, MD;  Location: [facility];  Service: General;  Laterality: Left;  left breast wire localization biopsy  . BREAST BIOPSY Right 07/27/2012  . BREAST BIOPSY Right 07/07/2016  . BREAST BIOPSY Left 06/30/2016  . cataracts right eye    . CESAREAN SECTION  1986  . HERNIA REPAIR  2000   RIH  . INTRAMEDULLARY (IM) NAIL INTERTROCHANTERIC Left 01/30/2017   Procedure: ORIF AFFIXUS NAIL;  Surgeon: Paralee Cancel, MD;  Location: [facility];  Service: Orthopedics;  Laterality: Left;  . IR REPLC GASTRO/COLONIC TUBE PERCUT W/FLUORO  09/29/2016  .  opirectinal membrane peel    . PEG PLACEMENT    . RHINOPLASTY  1983  . TRACHEOSTOMY    . TRACHEOSTOMY TUBE PLACEMENT N/A 12/15/2016   Procedure: TRACHEOSTOMY CHANGE;  Surgeon: [name] Montane, MD;  Location: [facility];  Service: ENT;  Laterality: N/A;    There were no vitals filed for this visit.  Subjective Assessment - 06/17/17 1720    Subjective  Pt very tired and sleepy this aftermoon - had OT prior to PT and OT woke her up; no new changes or problems reported    Patient is accompained by:  Family member    Pertinent History  Pow[place]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  No/denies                       [facility] Adult PT Treatment/Exercise - 06/17/17 0001      Transfers   Transfers  Sit to Stand;Stand to Sit    Sit to Stand  3: Mod assist;Multiple attempts    Sit to Stand Details  Tactile cues for sequencing;Tactile cues for posture;Tactile cues for weight shifting;Verbal cues for sequencing;Verbal cues  for technique;Manual facilitation for weight shifting;Manual facilitation for placement;Manual facilitation for weight bearing    Sit to Stand Details (indicate cue type and reason)  from high/low mat table    Stand to Sit  4: Min assist    Stand to Sit Details (indicate cue type and reason)  Tactile cues for sequencing;Tactile cues for weight shifting;Tactile cues for posture;Tactile cues for placement;Verbal cues for sequencing;Verbal cues for technique;Manual facilitation for weight shifting;Manual facilitation for placement;Manual facilitation for weight bearing      Ambulation/Gait   Ambulation/Gait  Yes    Ambulation/Gait Assistance  1: +2 Total assist    Ambulation Distance (Feet)  30 Feet 35    Assistive device  2 person hand held assist    Gait Pattern  Step-to pattern;Step-through pattern;Decreased stride length;Decreased dorsiflexion - right;Decreased dorsiflexion  - left;Decreased weight shift to left;Ataxic;Trunk flexed;Narrow base of support;Poor foot clearance - left    Ambulation Surface  Level;Indoor    Gait Comments  verbal and tactile cues for pt to extend knee and tanslate weight forward over foot prior to stepping with opposite foot; tactile and verbal cues to stand more upright                PT Short Term Goals - 06/10/17 1305      PT SHORT TERM GOAL #1   Title  Perform sit to/from stand from wheelchair or mat with +1 mod assist.    Baseline  06/09/16: met today    Status  Achieved      PT SHORT TERM GOAL #2   Title  Pt will perform sit to stand from wheelchair to RW with max assist for pre-gait skill.    Baseline  06/09/17: met today    Status  Achieved      PT SHORT TERM GOAL #3   Title  Pt will transfer supine to sit with max assist.    Baseline  06/09/17: met today, needs increased time to assist    Status  Achieved      PT SHORT TERM GOAL #4   Title  Pt will sit with RUE support with min to mod assist unsupported on side of mat for at least 3" to demo improved trunk strength and core stabilization.    Baseline  06/09/17: met today     Status  Achieved      PT SHORT TERM GOAL #5   Title  Pt will amb. 15' with appropriate assistive device with max assist +1    Baseline  06/09/17: pt now needs mod/max assist of 2 people for gait using modified 3 muskateer assist and blue rocker AFO to right LE    Status  Partially Met        PT Long Term Goals - 05/11/17 2122      PT LONG TERM GOAL #1   Title  Pt will perform supine rolling R and L with min assist  to indicate improved independence with bed mobility.      Baseline  dependent for all bed mobility per spouse's report - 05-10-17    Time  8    Period  Weeks    Status  New    Target Date  07/09/17      PT LONG TERM GOAL #2   Title  Pt will sit on side of mat with RUE support for at least 5" with SBA     Baseline  max assist to min assist - ability varies  Time  8     Period  Weeks    Status  New    Target Date  07/09/17      PT LONG TERM GOAL #3   Title  Pt will amb. 20' with appropriate assistive device with +1 max assist.    Baseline  +2 max - total assist- pt unable to advance LE's - feet not clearing due to pt not wearing AFO's - 05-10-17    Time  8    Period  Weeks    Status  New    Target Date  07/09/17      PT LONG TERM GOAL #4   Title  Pt will perform basic transfers with min to mod assist to indicate improved functional independence.      Baseline  max assist - 05-10-17    Time  8    Period  Weeks    Target Date  07/09/17      PT LONG TERM GOAL #5   Title  Pt will perform static standing x 3" with bil. UE support with mod assist.      Time  8    Period  Weeks    Status  New    Target Date  07/09/17      Additional Long Term Goals   Additional Long Term Goals  Yes      PT LONG TERM GOAL #6   Title  Pt will be able to return to participation in therapeutic horseback riding at [facility] for ongoing exercise program.    Time  8    Period  Weeks    Status  New    Target Date  07/09/17            Plan - 06/17/17 1726    Clinical Impression Statement  AFO's used on bilateral LE's but pt continues to have difficulty advancing LE's and clearing feet in swing phase of gait.  Pt may benefit from blue shoe cover to assist with swing thru (bilateral LE's). Pt amb. furthest distance to date in this admission, but requires +2  total assist.      Rehab Potential  Fair    Clinical Impairments Affecting Rehab Potential  severity of deficits with previous extensive therapies    PT Frequency  2x / week    PT Duration  8 weeks    PT Treatment/Interventions  ADLs/Self Care Home Management;Therapeutic exercise;Therapeutic activities;Functional mobility training;Gait training;Balance training;Neuromuscular re-education;Patient/family education;Orthotic Fit/Training;Passive range of motion    PT Next Visit Plan   continued to address  transfers, standing balance and pre gait    PT Home Exercise Plan  caregiver and husband have previously been instructed in ROM, sitting balance, and transfer training    Consulted and Agree with Plan of Care  Patient       Patient will benefit from skilled therapeutic intervention in order to improve the following deficits and impairments:  Abnormal gait, Decreased activity tolerance, Decreased balance, Decreased cognition, Decreased coordination, Decreased mobility, Decreased range of motion, Decreased endurance, Hypomobility, Decreased strength, Impaired flexibility, Increased muscle spasms, Impaired UE functional use, Postural dysfunction, Pain  Visit Diagnosis: Other abnormalities of gait and mobility     Problem List Patient Active Problem List   Diagnosis Date Noted  . Coagulase negative Staphylococcus bacteremia 02/09/2017  . Abdominal distension   . Chronic mucus hypersecretion, respiratory 12/31/2016  . Severe protein-calorie malnutrition [name]: less than 60% of standard weight) ([facility]) 12/31/2016  . Acute urinary retention 12/31/2016  .  Constipation 12/31/2016  . Shortness of breath   . Respiratory distress   . Pneumonia 12/05/2016  . Acute respiratory failure ([place] [place]) 12/05/2016  . Pseudomonas urinary tract infection 10/27/2016  . Severe muscle deconditioning 09/30/2016  . Pressure injury of skin 09/28/2016  . Respiratory failure ([facility])   . Tracheobronchitis   . Urinary retention   . PEG (percutaneous endoscopic gastrostomy) status ([place])   . Benign essential HTN   . Hyponatremia   . Hypokalemia   . Reactive hypertension   . Respiratory insufficiency 09/24/2016  . Malignant neoplasm of upper-inner quadrant of left breast in female, estrogen receptor positive ([place]) 07/14/2016  . Heterotopic ossification of bone 03/10/2016  . Acute on chronic respiratory failure with hypoxia ([facility])   . Acute hypoxemic respiratory failure ([place])   . Acute blood loss anemia   . Atelectasis  02/04/2016  . Debility 02/03/2016  . Pneumonia of both lower lobes due to Pseudomonas species ([place])   . Pneumonia of both lower lobes due to methicillin susceptible Staphylococcus aureus (MSSA) ([facility])   . Acute respiratory failure with hypoxia ([place])   . Acute encephalopathy   . Seizures ([place])   . Sepsis ([place])   . Hypoxia   . Pain   . Chronic respiratory failure ([place]) 12/28/2015  . Acute tracheobronchitis 12/24/2015  . Tracheostomy tube present ([facility])   . Tracheal stenosis   . Chronic respiratory failure with hypoxia ([place])   . Spastic tetraplegia ([place]) 10/20/2015  . Tracheostomy status ([facility]) 09/26/2015  . Allergic rhinitis 09/26/2015  . 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 Movement disorder 09/22/2015  . Encephalitis 09/22/2015  . Chest pain 02/07/2014  . Premature atrial contractions 01/13/2014  . Abnormality of gait 12/04/2013  . Hyperlipidemia 12/21/2011  . Cardiovascular risk factor 12/21/2011  . Bunion 01/31/2008  . Metatarsalgia of both feet 09/20/2007  . FLAT FOOT 09/20/2007  . HALLUX RIGIDUS, ACQUIRED 09/20/2007    Alda Lea, PT 06/17/2017, 5:31 PM  [facility] [address] [place], [place], [address] Phone: [phone]   Fax:  [phone]  Name: Ludean Duhart MRN: [id number] Date of Birth: Nov 29, 1950

## 2017-06-17 NOTE — Therapy (Signed)
[facility] [address] [place], [place], [address] Phone: [phone]   Fax:  [phone]  Speech Language Pathology Treatment  Patient Details  Name: Judy Spears MRN: [id number] Date of Birth: 07/23/1950 Referring Provider: Levada Schilling, MD   Encounter Date: 06/16/2017  End of Session - 06/17/17 0927    Visit Number  10    Number of Visits  17    Date for SLP Re-Evaluation  07/22/17    SLP Start Time  1533    SLP Stop Time   1616    SLP Time Calculation (min)  43 min    Activity Tolerance  Patient tolerated treatment well       Past Medical History:  Diagnosis Date  . Arthritis    lt great toe  . Complication of anesthesia    pt has had headaches post op-did advise to hydra well preop  . Hair loss    right-sided  . History of exercise stress test    a. ETT (12/15) with 12:00 exercise, no ST changes, occasional PACs.   . Hyperlipidemia   . Insomnia   . Migraine headache   . Movement disorder    resltess in left legs  . Postmenopausal   . 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 Premature atrial contractions    a. Holter (12/15) with 8% PACs, no atrial runs or atrial fibrillation.     Past Surgical History:  Procedure Laterality Date  . BREAST BIOPSY  01/01/2011   Procedure: BREAST BIOPSY WITH NEEDLE LOCALIZATION;  Surgeon: Rolm Bookbinder, MD;  Location: [facility];  Service: General;  Laterality: Left;  left breast wire localization biopsy  . BREAST BIOPSY Right 07/27/2012  . BREAST BIOPSY Right 07/07/2016  . BREAST BIOPSY Left 06/30/2016  . cataracts right eye    . CESAREAN SECTION  1986  . HERNIA REPAIR  2000   RIH  . INTRAMEDULLARY (IM) NAIL INTERTROCHANTERIC Left 01/30/2017   Procedure: ORIF AFFIXUS NAIL;  Surgeon: Paralee Cancel, MD;  Location: [facility];  Service: Orthopedics;  Laterality: Left;  . IR REPLC GASTRO/COLONIC TUBE PERCUT W/FLUORO  09/29/2016  . opirectinal membrane  peel    . PEG PLACEMENT    . RHINOPLASTY  1983  . TRACHEOSTOMY    . TRACHEOSTOMY TUBE PLACEMENT N/A 12/15/2016   Procedure: TRACHEOSTOMY CHANGE;  Surgeon: [name] Montane, MD;  Location: [facility];  Service: ENT;  Laterality: N/A;    There were no vitals filed for this visit.  Subjective Assessment - 06/17/17 0913    Subjective  Pt waiting for SLP in parallel bars today. Voice mildly hydrophonic.    Patient is accompained by:  Family member [name] and [name]    Currently in Pain?  No/denies            ADULT SLP TREATMENT - 06/17/17 0001      General Information   Behavior/Cognition  Alert;Cooperative;Pleasant mood;Distractible;Requires cueing      Treatment Provided   Treatment provided  Cognitive-Linquistic      Cognitive-Linquistic Treatment   Treatment focused on  Dysarthria;Patient/family/caregiver education    Skilled Treatment   IMITATION: Voice: 1-syllable 71% (5/7) 2-syllable 50% (1/2), 3-syllable N/A - Intelligible: 1-syllable 57% (4/7) 2-syllable 0% (0/2), 3-syllable N/A. SPONTANEOUS: Voice: 1-syllable 60% (3/5) 2-syllable 23% (3/13), 3-syllable 40% (2/5), 4+ syllable 50% (5/10) - Intelligibility: 1-syllable 60% (3/5), 2-syllable 23% (3/13), 3-syllable 20% (1/5) 4+syllable 20% (2/10). Pt was more alert and awake today in parallel bars than  in [place] room during last session; only requiring tactile cues x1 for arousal. Pt was producing many 4+ syllable sentences today, one word at a time, but few had adequate voicing and very few were able to be understood due to low vocal volume. In oral motor HEP pt labial press continues as weak; bottom lip appears more firm than top lip. Pt with lip protrusion <10% without tactile/visual/modeling (max A). Kissing sound <5% of the time.       Assessment / Recommendations / Plan   Plan  Continue with current plan of care use standing with +1-2 assistance when possible      Progression Toward Goals   Progression toward goals  Progressing toward goals          SLP Short Term Goals - 06/17/17 0929      SLP SHORT TERM GOAL #1   Title  Pt will demonstrate dysarthria HEP to increase articulatory coordination for speech with usual mod A over 3 sessions    Time  1    Period  Weeks    Status  Deferred rec'd HEP 5-21/19      SLP SHORT TERM GOAL #2   Title  Pt will achieve phonation adequate for SLP intelligibility in imitated two-syllable words using intelligibility compensations 12/20 trials with usual min A in 3 sessions    Status  Not Met      SLP SHORT TERM GOAL #3   Title  Pt will imitate 2-syllable words or phrases with functional intelligibility for SLP in 5/15 trials over 3 sessions     Status  Not Met      SLP SHORT TERM GOAL #4   Title  Pt will sustain attention to therapy tasks for 10 minutes with multimodal cues in 5 sessions    Baseline  05-23-17, 06-10-17, 06-17-17    Status  Partially Met      SLP SHORT TERM GOAL #5   Title  pt family will demo understanding of oral care regimen, including safe ingestion of water/ice chips over three sessions    Status  Achieved      SLP SHORT TERM GOAL #6   Title  pt will demo dysphagia HEP to improve management of secretions and safety with any possible POs    Status  Deferred due to work on speech      SLP [place] #7   Title  family/caregivers will demo understanding of pt swallowing precautions, for pt safety with POs, by verbal report     Status  Achieved on 06-10-17 [name] indicating prior to PO H2O to clear secretions that pt had just had thorough oral care prior to session        SLP Long Term Goals - 06/17/17 0932      SLP LONG TERM GOAL #1   Title  Pt will demo dysarthria  HEP to incr articulatory coordination for speech, with occasional mod A over three sessions    Time  5    Period  Weeks    Status  On-going      SLP LONG TERM GOAL #2   Title  pt will achieve phonation adequate for SLP intelligibility in imitated 3-syllable words/3-word phrases using  intelligibility compenastions 15/20 trials    Time  5    Period  Weeks    Status  On-going      SLP LONG TERM GOAL #3   Title  Pt will imitate 2-syllable words or phrases with functional intelligibility for  SLP in 8/15 trials over three sessions    Time  5    Period  Weeks    Status  On-going      SLP LONG TERM GOAL #4   Title  pt will sustain attention to therapy tasks for 10 minutes with multimodal cues in 10 sessions    Baseline  06-10-17    Time  5    Period  Weeks    Status  On-going      SLP LONG TERM GOAL #5   Title  pt will demo dysphagia HEP to improve management of secretions and safety with possible POs    Time  5    Period  Weeks    Status  On-going       Plan - 06/17/17 3546    Clinical Impression Statement  Patient presents with ongoing deficits in speech intelligibility (dysarthria, reduced breath support), cognitive communication and swallowing. Pt alertness was improved today over last session due to using parallel bars, so pt was in more of a "ready-state" for possibility of progress. Pt with many spontaneous 4+ syllable utterances however very few were understood >6 inches from pt's face due to reduced vocal volume/inadequate voicing. See skilled intervention for details of voicing, functional speech intelligilibty for imitation and spontaneous production of 1-4+ syllable words/phrases. Recommend continued direct skilled ST to address pt's decline in communication skills, and to monitor a HEP for dysphagia and dysarthria in order to maximize functional communication, secretion management/ swallowing safety, reduce caregiver burden and improve quality of life.    Speech Therapy Frequency  2x / week    Treatment/Interventions  Aspiration precaution training;Trials of upgraded texture/liquids;Compensatory strategies;Functional tasks;Patient/family education;Cueing hierarchy;Diet toleration management by SLP;Cognitive reorganization;Compensatory techniques;SLP instruction  and feedback;Internal/external aids;Multimodal communcation approach;Pharyngeal strengthening exercises;Oral motor exercises    Potential to Achieve Goals  Fair    Potential Considerations  Ability to learn/carryover information;Co-morbidities;Severity of impairments;Cooperation/participation level;Previous level of function    Consulted and Agree with Plan of Care  Patient;Family member/caregiver    Family Member Consulted  Husband, [name]       Patient will benefit from skilled therapeutic intervention in order to improve the following deficits and impairments:   Dysarthria and anarthria  Cognitive communication deficit  Aphasia  Dysphagia, oropharyngeal phase    Problem List Patient Active Problem List   Diagnosis Date Noted  . Coagulase negative Staphylococcus bacteremia 02/09/2017  . Abdominal distension   . Chronic mucus hypersecretion, respiratory 12/31/2016  . Severe protein-calorie malnutrition [name]: less than 60% of standard weight) ([place]) 12/31/2016  . Acute urinary retention 12/31/2016  . Constipation 12/31/2016  . Shortness of breath   . Respiratory distress   . Pneumonia 12/05/2016  . Acute respiratory failure ([place]) 12/05/2016  . Pseudomonas urinary tract infection 10/27/2016  . Severe muscle deconditioning 09/30/2016  . Pressure injury of skin 09/28/2016  . Respiratory failure ([place])   . Tracheobronchitis   . Urinary retention   . PEG (percutaneous endoscopic gastrostomy) status ([place])   . Benign essential HTN   . Hyponatremia   . Hypokalemia   . Reactive hypertension   . Respiratory insufficiency 09/24/2016  . Malignant neoplasm of upper-inner quadrant of left breast in female, estrogen receptor positive ([place]) 07/14/2016  . Heterotopic ossification of bone 03/10/2016  . Acute on chronic respiratory failure with hypoxia ([place])   . Acute hypoxemic respiratory failure ([place])   . Acute blood loss anemia   . Atelectasis 02/04/2016  . Debility 02/03/2016  .  Pneumonia  of both lower lobes due to Pseudomonas species ([place])   . Pneumonia of both lower lobes due to methicillin susceptible Staphylococcus aureus (MSSA) ([place])   . Acute respiratory failure with hypoxia ([place])   . Acute encephalopathy   . Seizures ([place])   . Sepsis ([place])   . Hypoxia   . Pain   . Chronic respiratory failure ([place]) 12/28/2015  . Acute tracheobronchitis 12/24/2015  . Tracheostomy tube present ([place])   . Tracheal stenosis   . Chronic respiratory failure with hypoxia ([place])   . Spastic tetraplegia ([place]) 10/20/2015  . Tracheostomy status ([place]) 09/26/2015  . Allergic rhinitis 09/26/2015  . 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 Movement disorder 09/22/2015  . Encephalitis 09/22/2015  . Chest pain 02/07/2014  . Premature atrial contractions 01/13/2014  . Abnormality of gait 12/04/2013  . Hyperlipidemia 12/21/2011  . Cardiovascular risk factor 12/21/2011  . Bunion 01/31/2008  . Metatarsalgia of both feet 09/20/2007  . FLAT FOOT 09/20/2007  . HALLUX RIGIDUS, ACQUIRED 09/20/2007    [facility] ,MS, [place]  06/17/2017, 9:32 AM  [facility] [address] [place], [place], [address] Phone: [phone]   Fax:  [phone]   Name: Mirel Hundal MRN: [id number] Date of Birth: Dec 28, 1950

## 2017-06-21 ENCOUNTER — Ambulatory Visit: Payer: Medicare Other | Admitting: Physical Therapy

## 2017-06-21 ENCOUNTER — Ambulatory Visit: Payer: Medicare Other | Admitting: Occupational Therapy

## 2017-06-21 ENCOUNTER — Encounter: Payer: Self-pay | Admitting: Occupational Therapy

## 2017-06-21 DIAGNOSIS — R29818 Other symptoms and signs involving the nervous system: Secondary | ICD-10-CM

## 2017-06-21 DIAGNOSIS — R293 Abnormal posture: Secondary | ICD-10-CM

## 2017-06-21 DIAGNOSIS — R2681 Unsteadiness on feet: Secondary | ICD-10-CM

## 2017-06-21 DIAGNOSIS — R2689 Other abnormalities of gait and mobility: Secondary | ICD-10-CM

## 2017-06-21 DIAGNOSIS — M6281 Muscle weakness (generalized): Secondary | ICD-10-CM

## 2017-06-21 DIAGNOSIS — R41844 Frontal lobe and executive function deficit: Secondary | ICD-10-CM

## 2017-06-21 DIAGNOSIS — R471 Dysarthria and anarthria: Secondary | ICD-10-CM | POA: Diagnosis not present

## 2017-06-21 NOTE — Therapy (Signed)
[facility] [address] [place], [place], [address] Phone: [phone]   Fax:  [phone]  Occupational Therapy Treatment  Patient Details  Name: Judy Spears MRN: [id number] Date of Birth: 1950/08/28 Referring Provider: Dr. Levada Schilling   Encounter Date: 06/21/2017  OT End of Session - 06/21/17 1816    Visit Number  11    Number of Visits  16    Date for OT Re-Evaluation  07/05/17    Authorization Type  medicare     Authorization Time Period  90 days    OT Start Time  1403    OT Stop Time  1446    OT Time Calculation (min)  43 min    Activity Tolerance  Patient tolerated treatment well    Behavior During Therapy  [facility] for tasks assessed/performed       Past Medical History:  Diagnosis Date  . Arthritis    lt great toe  . Complication of anesthesia    pt has had headaches post op-did advise to hydra well preop  . Hair loss    right-sided  . History of exercise stress test    a. ETT (12/15) with 12:00 exercise, no ST changes, occasional PACs.   . Hyperlipidemia   . Insomnia   . Migraine headache   . Movement disorder    resltess in left legs  . Postmenopausal   . 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 Premature atrial contractions    a. Holter (12/15) with 8% PACs, no atrial runs or atrial fibrillation.     Past Surgical History:  Procedure Laterality Date  . BREAST BIOPSY  01/01/2011   Procedure: BREAST BIOPSY WITH NEEDLE LOCALIZATION;  Surgeon: Rolm Bookbinder, MD;  Location: [facility];  Service: General;  Laterality: Left;  left breast wire localization biopsy  . BREAST BIOPSY Right 07/27/2012  . BREAST BIOPSY Right 07/07/2016  . BREAST BIOPSY Left 06/30/2016  . cataracts right eye    . CESAREAN SECTION  1986  . HERNIA REPAIR  2000   RIH  . INTRAMEDULLARY (IM) NAIL INTERTROCHANTERIC Left 01/30/2017   Procedure: ORIF AFFIXUS NAIL;  Surgeon: Paralee Cancel, MD;  Location: [facility];   Service: Orthopedics;  Laterality: Left;  . IR REPLC GASTRO/COLONIC TUBE PERCUT W/FLUORO  09/29/2016  . opirectinal membrane peel    . PEG PLACEMENT    . RHINOPLASTY  1983  . TRACHEOSTOMY    . TRACHEOSTOMY TUBE PLACEMENT N/A 12/15/2016   Procedure: TRACHEOSTOMY CHANGE;  Surgeon: [name] Montane, MD;  Location: [facility];  Service: ENT;  Laterality: N/A;    There were no vitals filed for this visit.  Subjective Assessment - 06/21/17 1808    Subjective   Good    Patient is accompained by:  Family member    Pertinent History  see epic snapshot - ABI/encephalitis/hypoxia from virus; recent hospitalization due to MRSA PNA     Patient Stated Goals  Pt unable to state.     Currently in Pain?  No/denies    Pain Score  0-No pain                   OT Treatments/Exercises (OP) - 06/21/17 0001      Neurological Re-education Exercises   Other Exercises 1  Patient able to sit unsupported with one UE support (right arm) for several minutes.  Worked on transitioning forward in sitting in preparation for standing with cueing to reduce hip adduction.  In  standing worked to achieve active alignment toward midline - tucking in right hip, and conversely aligning left aspect of lateral rib cage.  Patient with decreased active (ineffective) trunk flexion with body alignment corrections.  Patient able to activate but not sustain postural alignment corrections.                OT Short Term Goals - 06/21/17 1817      OT SHORT TERM GOAL #1   Title  Pt will be mod a for sit to stand from toilet in steady to reduce care giver burden. - 06/07/2017    Status  On-going      OT SHORT TERM GOAL #2   Title  Pt will sit on level surface with 1 UE support only in preparation for participation in basic self care tasks for at least 3 minutes consistently    Status  Achieved      OT SHORT TERM GOAL #3   Title  Pt will maintain static standing balance with BUE support during ADL activity  with mod a for at  least 3 minutes consistently    Status  Achieved      OT SHORT TERM GOAL #4   Title  Pt will roll during LB dressing with max a x1 to reduce caregiver burden.     Status  On-going      OT SHORT TERM GOAL #5   Title  Pt will demonstrate ability to follow simple, functional commands in context at least 50% of the time.     Status  Achieved        OT Long Term Goals - 06/14/17 1305      OT LONG TERM GOAL #1   Title  Pt will go from sit to stand from toilet with steady with min a to reduce caregiver burden - 07/05/2017    Status  On-going      OT LONG TERM GOAL #2   Title  Pt will sit on level surfaces for participation in basic self care tasks with no UE support for at least 5 minutes    Status  On-going      OT LONG TERM GOAL #3   Title  Pt will maintain static standing balance with BUE support duirng ADL activity with min a for at least 3 minutes    Status  On-going      OT LONG TERM GOAL #4   Title  Pt wil roll during LB dressing with mod a x1 to reduce caregiver burden.      Status  On-going            Plan - 06/21/17 1817    Clinical Impression Statement  Patient continues with fluctuations in ability to participate in OP day to day.      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    Occupational performance deficits (Please refer to evaluation for details):  ADL's;IADL's;Rest and Sleep;Leisure;Social Participation;Work    Physicist, medical    Current Impairments/barriers affecting progress:  severity of deficits, very slow rate of recovery; pt with recent significant decline over past several months due medical complications    OT Frequency  2x / week    OT Duration  8 weeks    OT Treatment/Interventions  Self-care/ADL training;Ultrasound;Moist  Heat;Electrical Stimulation;DME and/or AE instruction;Neuromuscular  education;Therapeutic exercise;Functional Mobility Training;Manual Therapy;Passive range of motion;Splinting;Therapeutic activities;Balance training;Patient/family education;Visual/perceptual remediation/compensation;Cognitive remediation/compensation    Plan   review splints from prior episodes, NMR trunk and LE's for transitional movements, static standing, sitting balance, sit to stand, stand to sit, stepping    Consulted and Agree with Plan of Care  Patient;Family member/caregiver    Family Member Consulted  husband [name] and caregiver Leesa       Patient will benefit from skilled therapeutic intervention in order to improve the following deficits and impairments:  Decreased endurance, Decreased skin integrity, Impaired vision/preception, Improper body mechanics, Impaired perceived functional ability, Decreased knowledge of precautions, Cardiopulmonary status limiting activity, Decreased activity tolerance, Decreased knowledge of use of DME, Decreased strength, Impaired flexibility, Improper spinal/pelvic alignment, Impaired sensation, Difficulty walking, Decreased mobility, Decreased balance, Decreased cognition, Decreased range of motion, Impaired tone, Impaired UE functional use, Decreased safety awareness, Decreased coordination, Pain, Abnormal gait  Visit Diagnosis: Abnormal posture  Unsteadiness on feet  Other symptoms and signs involving the nervous system  Frontal lobe and executive function deficit  Muscle weakness (generalized)    Problem List Patient Active Problem List   Diagnosis Date Noted  . Coagulase negative Staphylococcus bacteremia 02/09/2017  . Abdominal distension   . Chronic mucus hypersecretion, respiratory 12/31/2016  . Severe protein-calorie malnutrition [name]: less than 60% of standard weight) ([place]) 12/31/2016  . Acute urinary retention 12/31/2016  . Constipation 12/31/2016  . Shortness of  breath   . Respiratory distress   . Pneumonia 12/05/2016  . Acute respiratory failure ([place]) 12/05/2016  . Pseudomonas urinary tract infection 10/27/2016  . Severe muscle deconditioning 09/30/2016  . Pressure injury of skin 09/28/2016  . Respiratory failure ([place])   . Tracheobronchitis   . Urinary retention   . PEG (percutaneous endoscopic gastrostomy) status ([place])   . Benign essential HTN   . Hyponatremia   . Hypokalemia   . Reactive hypertension   . Respiratory insufficiency 09/24/2016  . Malignant neoplasm of upper-inner quadrant of left breast in female, estrogen receptor positive ([place]) 07/14/2016  . Heterotopic ossification of bone 03/10/2016  . Acute on chronic respiratory failure with hypoxia ([place])   . Acute hypoxemic respiratory failure ([place])   . Acute blood loss anemia   . Atelectasis 02/04/2016  . Debility 02/03/2016  . Pneumonia of both lower lobes due to Pseudomonas species ([place])   . Pneumonia of both lower lobes due to methicillin susceptible Staphylococcus aureus (MSSA) ([place])   . Acute respiratory failure with hypoxia ([place])   . Acute encephalopathy   . Seizures ([place])   . Sepsis ([place])   . Hypoxia   . Pain   . Chronic respiratory failure ([place]) 12/28/2015  . Acute tracheobronchitis 12/24/2015  . Tracheostomy tube present ([place])   . Tracheal stenosis   . Chronic respiratory failure with hypoxia ([place])   . Spastic tetraplegia ([place]) 10/20/2015  . Tracheostomy status ([place]) 09/26/2015  . Allergic rhinitis 09/26/2015  . 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 Movement disorder 09/22/2015  . Encephalitis 09/22/2015  . Chest pain 02/07/2014  . Premature atrial contractions 01/13/2014  . Abnormality of gait 12/04/2013  . Hyperlipidemia 12/21/2011  . Cardiovascular risk factor 12/21/2011  . Bunion 01/31/2008  . Metatarsalgia of both feet 09/20/2007  . FLAT FOOT 09/20/2007  . HALLUX RIGIDUS, ACQUIRED 09/20/2007    Mariah Milling, OTR/L 06/21/2017, 6:19  PM  [facility] [address] [place], [place], [address] Phone: [phone]   Fax:  [phone]  Name: Sita Mangen MRN: [id number] Date of Birth: 16-Jun-1950

## 2017-06-22 ENCOUNTER — Encounter: Payer: Self-pay | Admitting: Physical Therapy

## 2017-06-22 NOTE — Therapy (Signed)
[facility] [address] [place], [place], [address] Phone: [phone]   Fax:  [phone]  Physical Therapy Treatment  Patient Details  Name: Judy Spears MRN: [id number] Date of Birth: [birth year] Referring Provider: Dr. Levada Schilling   Encounter Date: 06/21/2017  PT End of Session - 06/22/17 1150    Visit Number  10    Number of Visits  17    Date for PT Re-Evaluation  07/09/17    Authorization Type  [employer] Medicare    Authorization Time Period  05-10-17 - 08-08-17    PT Start Time  1445    PT Stop Time  1534    PT Time Calculation (min)  49 min       Past Medical History:  Diagnosis Date  . Arthritis    lt great toe  . Complication of anesthesia    pt has had headaches post op-did advise to hydra well preop  . Hair loss    right-sided  . History of exercise stress test    a. ETT (12/15) with 12:00 exercise, no ST changes, occasional PACs.   . Hyperlipidemia   . Insomnia   . Migraine headache   . Movement disorder    resltess in left legs  . Postmenopausal   . 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 Premature atrial contractions    a. Holter (12/15) with 8% PACs, no atrial runs or atrial fibrillation.     Past Surgical History:  Procedure Laterality Date  . BREAST BIOPSY  01/01/2011   Procedure: BREAST BIOPSY WITH NEEDLE LOCALIZATION;  Surgeon: Rolm Bookbinder, MD;  Location: [facility];  Service: General;  Laterality: Left;  left breast wire localization biopsy  . BREAST BIOPSY Right 07/27/2012  . BREAST BIOPSY Right 07/07/2016  . BREAST BIOPSY Left 06/30/2016  . cataracts right eye    . CESAREAN SECTION  1986  . HERNIA REPAIR  2000   RIH  . INTRAMEDULLARY (IM) NAIL INTERTROCHANTERIC Left 01/30/2017   Procedure: ORIF AFFIXUS NAIL;  Surgeon: Paralee Cancel, MD;  Location: [facility];  Service: Orthopedics;  Laterality: Left;  . IR REPLC GASTRO/COLONIC TUBE PERCUT W/FLUORO  09/29/2016  .  opirectinal membrane peel    . PEG PLACEMENT    . RHINOPLASTY  1983  . TRACHEOSTOMY    . TRACHEOSTOMY TUBE PLACEMENT N/A 12/15/2016   Procedure: TRACHEOSTOMY CHANGE;  Surgeon: [name] Montane, MD;  Location: [facility];  Service: ENT;  Laterality: N/A;    There were no vitals filed for this visit.  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  No/denies                       [facility] Adult PT Treatment/Exercise - 06/22/17 0001      Transfers   Transfers  Sit to Stand;Stand to Sit    Sit to Stand  3: Mod assist;Multiple attempts    Sit to Stand Details  Tactile cues for sequencing;Tactile cues for posture;Tactile cues for weight shifting;Verbal cues for sequencing;Verbal cues for technique;Manual facilitation for weight shifting;Manual facilitation for placement;Manual facilitation for weight bearing    Sit to Stand Details (indicate cue  type and reason)  from wheelchair and from Steady    Stand to Sit  3: Mod assist    Stand to Sit Details (indicate cue type and reason)  Tactile cues for sequencing;Tactile cues for weight shifting;Tactile cues for posture;Tactile cues for placement;Verbal cues for sequencing;Verbal cues for technique;Manual facilitation for weight shifting;Manual facilitation for placement;Manual facilitation for weight bearing      Ambulation/Gait   Ambulation/Gait  Yes    Ambulation/Gait Assistance  1: +2 Total assist    Ambulation Distance (Feet)  20 Feet 15    Assistive device  2 person hand held assist    Gait Pattern  Step-to pattern;Step-through pattern;Decreased stride length;Decreased dorsiflexion - right;Decreased dorsiflexion - left;Decreased weight shift to left;Ataxic;Trunk flexed;Narrow base of support;Poor foot  clearance - left    Ambulation Surface  Level;Indoor    Gait Comments  verbal and tactile cues for pt to extend knee and tanslate weight forward over foot prior to stepping with opposite foot; tactile and verbal cues to stand more upright       Neuro Re-ed    Neuro Re-ed Details   Pt stood in Steady - mirror used for visual feedback:  cues to tuck Rt hip (to adduct) and to lift Lt shoulder and trunk to increase upright posture to decr. forward lean       Knee/Hip Exercises: Aerobic   Recumbent Bike  Scifit level 1.5 x 10" with pt holding with RUE and intermittently with Lt hand as pt had difficulty maintaining grip on handle bar due to tightness               PT Short Term Goals - 06/10/17 1305      PT SHORT TERM GOAL #1   Title  Perform sit to/from stand from wheelchair or mat with +1 mod assist.    Baseline  06/09/16: met today    Status  Achieved      PT SHORT TERM GOAL #2   Title  Pt will perform sit to stand from wheelchair to RW with max assist for pre-gait skill.    Baseline  06/09/17: met today    Status  Achieved      PT SHORT TERM GOAL #3   Title  Pt will transfer supine to sit with max assist.    Baseline  06/09/17: met today, needs increased time to assist    Status  Achieved      PT SHORT TERM GOAL #4   Title  Pt will sit with RUE support with min to mod assist unsupported on side of mat for at least 3" to demo improved trunk strength and core stabilization.    Baseline  06/09/17: met today     Status  Achieved      PT SHORT TERM GOAL #5   Title  Pt will amb. 15' with appropriate assistive device with max assist +1    Baseline  06/09/17: pt now needs mod/max assist of 2 people for gait using modified 3 muskateer assist and blue rocker AFO to right LE    Status  Partially Met        PT Long Term Goals - 05/11/17 2122      PT LONG TERM GOAL #1   Title  Pt will perform supine rolling R and L with min assist  to indicate improved independence with bed  mobility.      Baseline  dependent for all bed mobility per spouse's report - 05-10-17  Time  8    Period  Weeks    Status  New    Target Date  07/09/17      PT LONG TERM GOAL #2   Title  Pt will sit on side of mat with RUE support for at least 5" with SBA     Baseline  max assist to min assist - ability varies    Time  8    Period  Weeks    Status  New    Target Date  07/09/17      PT LONG TERM GOAL #3   Title  Pt will amb. 20' with appropriate assistive device with +1 max assist.    Baseline  +2 max - total assist- pt unable to advance LE's - feet not clearing due to pt not wearing AFO's - 05-10-17    Time  8    Period  Weeks    Status  New    Target Date  07/09/17      PT LONG TERM GOAL #4   Title  Pt will perform basic transfers with min to mod assist to indicate improved functional independence.      Baseline  max assist - 05-10-17    Time  8    Period  Weeks    Target Date  07/09/17      PT LONG TERM GOAL #5   Title  Pt will perform static standing x 3" with bil. UE support with mod assist.      Time  8    Period  Weeks    Status  New    Target Date  07/09/17      Additional Long Term Goals   Additional Long Term Goals  Yes      PT LONG TERM GOAL #6   Title  Pt will be able to return to participation in therapeutic horseback riding at [facility] for ongoing exercise program.    Time  8    Period  Weeks    Status  New    Target Date  07/09/17            Plan - 06/22/17 1151    Clinical Impression Statement  Pt able to actively adduct Rt hip/pelvis in standing with tactile and verbal cues; mirror used for feedback to correct deviations, which was beneficial:  cues also given for pt to lift Lt trunk and shoulder to increase upright posture    Rehab Potential  Fair    Clinical Impairments Affecting Rehab Potential  severity of deficits with previous extensive therapies    PT Frequency  2x / week    PT Duration  8 weeks    PT Treatment/Interventions   ADLs/Self Care Home Management;Therapeutic exercise;Therapeutic activities;Functional mobility training;Gait training;Balance training;Neuromuscular re-education;Patient/family education;Orthotic Fit/Training;Passive range of motion    PT Next Visit Plan  measure LLE discrepancy - LLE appears to be longer than RLE; cont gait if assistance is available - blue shoe cover on feet to assist with swing thru    PT Home Exercise Plan  caregiver and husband have previously been instructed in ROM, sitting balance, and transfer training    Consulted and Agree with Plan of Care  Patient       Patient will benefit from skilled therapeutic intervention in order to improve the following deficits and impairments:  Abnormal gait, Decreased activity tolerance, Decreased balance, Decreased cognition, Decreased coordination, Decreased mobility, Decreased range of motion, Decreased endurance, Hypomobility, Decreased strength, Impaired flexibility, Increased  muscle spasms, Impaired UE functional use, Postural dysfunction, Pain  Visit Diagnosis: Other abnormalities of gait and mobility  Abnormal posture  Muscle weakness (generalized)     Problem List Patient Active Problem List   Diagnosis Date Noted  . Coagulase negative Staphylococcus bacteremia 02/09/2017  . Abdominal distension   . Chronic mucus hypersecretion, respiratory 12/31/2016  . Severe protein-calorie malnutrition [name]: less than 60% of standard weight) ([place]) 12/31/2016  . Acute urinary retention 12/31/2016  . Constipation 12/31/2016  . Shortness of breath   . Respiratory distress   . Pneumonia 12/05/2016  . Acute respiratory failure ([place]) 12/05/2016  . Pseudomonas urinary tract infection 10/27/2016  . Severe muscle deconditioning 09/30/2016  . Pressure injury of skin 09/28/2016  . Respiratory failure ([place])   . Tracheobronchitis   . Urinary retention   . PEG (percutaneous endoscopic gastrostomy) status ([place])   . Benign essential HTN    . Hyponatremia   . Hypokalemia   . Reactive hypertension   . Respiratory insufficiency 09/24/2016  . Malignant neoplasm of upper-inner quadrant of left breast in female, estrogen receptor positive ([place]) 07/14/2016  . Heterotopic ossification of bone 03/10/2016  . Acute on chronic respiratory failure with hypoxia ([place])   . Acute hypoxemic respiratory failure ([place])   . Acute blood loss anemia   . Atelectasis 02/04/2016  . Debility 02/03/2016  . Pneumonia of both lower lobes due to Pseudomonas species ([place])   . Pneumonia of both lower lobes due to methicillin susceptible Staphylococcus aureus (MSSA) ([place])   . Acute respiratory failure with hypoxia ([place])   . Acute encephalopathy   . Seizures ([place])   . Sepsis ([place])   . Hypoxia   . Pain   . Chronic respiratory failure ([place]) 12/28/2015  . Acute tracheobronchitis 12/24/2015  . Tracheostomy tube present ([place])   . Tracheal stenosis   . Chronic respiratory failure with hypoxia ([place])   . Spastic tetraplegia ([place]) 10/20/2015  . Tracheostomy status ([place]) 09/26/2015  . Allergic rhinitis 09/26/2015  . 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 Movement disorder 09/22/2015  . Encephalitis 09/22/2015  . Chest pain 02/07/2014  . Premature atrial contractions 01/13/2014  . Abnormality of gait 12/04/2013  . Hyperlipidemia 12/21/2011  . Cardiovascular risk factor 12/21/2011  . Bunion 01/31/2008  . Metatarsalgia of both feet 09/20/2007  . FLAT FOOT 09/20/2007  . HALLUX RIGIDUS, ACQUIRED 09/20/2007    Alda Lea, PT 06/22/2017, 11:58 AM  [facility] [address] [place], [place], [address] Phone: [phone]   Fax:  [phone]  Name: Maysun Meditz MRN: [id number] Date of Birth: [birth year]

## 2017-06-23 ENCOUNTER — Encounter: Payer: Self-pay | Admitting: Occupational Therapy

## 2017-06-23 ENCOUNTER — Ambulatory Visit: Payer: Medicare Other

## 2017-06-23 ENCOUNTER — Ambulatory Visit: Payer: Medicare Other | Admitting: Physical Therapy

## 2017-06-23 ENCOUNTER — Ambulatory Visit: Payer: Medicare Other | Admitting: Occupational Therapy

## 2017-06-23 DIAGNOSIS — R2681 Unsteadiness on feet: Secondary | ICD-10-CM

## 2017-06-23 DIAGNOSIS — M6281 Muscle weakness (generalized): Secondary | ICD-10-CM

## 2017-06-23 DIAGNOSIS — R293 Abnormal posture: Secondary | ICD-10-CM

## 2017-06-23 DIAGNOSIS — R2689 Other abnormalities of gait and mobility: Secondary | ICD-10-CM

## 2017-06-23 DIAGNOSIS — R471 Dysarthria and anarthria: Secondary | ICD-10-CM | POA: Diagnosis not present

## 2017-06-23 DIAGNOSIS — R1312 Dysphagia, oropharyngeal phase: Secondary | ICD-10-CM

## 2017-06-23 DIAGNOSIS — R4701 Aphasia: Secondary | ICD-10-CM

## 2017-06-23 DIAGNOSIS — R29818 Other symptoms and signs involving the nervous system: Secondary | ICD-10-CM

## 2017-06-23 DIAGNOSIS — R41841 Cognitive communication deficit: Secondary | ICD-10-CM

## 2017-06-23 DIAGNOSIS — R41844 Frontal lobe and executive function deficit: Secondary | ICD-10-CM

## 2017-06-23 NOTE — Therapy (Signed)
[facility] [address] [place], [place], [address] Phone: [phone]   Fax:  [phone]  Speech Language Pathology Treatment  Patient Details  Name: Judy Spears MRN: [id number] Date of Birth: 1950/06/18 Referring Provider: Levada Schilling, MD   Encounter Date: 06/23/2017  End of Session - 06/23/17 1656    Visit Number  11    Number of Visits  17    Date for SLP Re-Evaluation  07/22/17    SLP Start Time  1321 pt 5 minutes late to ST       Past Medical History:  Diagnosis Date  . Arthritis    lt great toe  . Complication of anesthesia    pt has had headaches post op-did advise to hydra well preop  . Hair loss    right-sided  . History of exercise stress test    a. ETT (12/15) with 12:00 exercise, no ST changes, occasional PACs.   . Hyperlipidemia   . Insomnia   . Migraine headache   . Movement disorder    resltess in left legs  . Postmenopausal   . 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 Premature atrial contractions    a. Holter (12/15) with 8% PACs, no atrial runs or atrial fibrillation.     Past Surgical History:  Procedure Laterality Date  . BREAST BIOPSY  01/01/2011   Procedure: BREAST BIOPSY WITH NEEDLE LOCALIZATION;  Surgeon: Rolm Bookbinder, MD;  Location: [facility];  Service: General;  Laterality: Left;  left breast wire localization biopsy  . BREAST BIOPSY Right 07/27/2012  . BREAST BIOPSY Right 07/07/2016  . BREAST BIOPSY Left 06/30/2016  . cataracts right eye    . CESAREAN SECTION  1986  . HERNIA REPAIR  2000   RIH  . INTRAMEDULLARY (IM) NAIL INTERTROCHANTERIC Left 01/30/2017   Procedure: ORIF AFFIXUS NAIL;  Surgeon: Paralee Cancel, MD;  Location: [facility];  Service: Orthopedics;  Laterality: Left;  . IR REPLC GASTRO/COLONIC TUBE PERCUT W/FLUORO  09/29/2016  . opirectinal membrane peel    . PEG PLACEMENT    . RHINOPLASTY  1983  . TRACHEOSTOMY    . TRACHEOSTOMY TUBE PLACEMENT  N/A 12/15/2016   Procedure: TRACHEOSTOMY CHANGE;  Surgeon: [name] Montane, MD;  Location: [facility];  Service: ENT;  Laterality: N/A;    There were no vitals filed for this visit.  Subjective Assessment - 06/23/17 1429    Subjective  Pt seen in ST room today, arrived at 1319-1320.     Patient is accompained by:  Family member brother, sister in law, and [name]    Currently in Pain?  No/denies            ADULT SLP TREATMENT - 06/23/17 1430      General Information   Behavior/Cognition  Cooperative;Pleasant mood;Lethargic;Distractible      Treatment Provided   Treatment provided  Cognitive-Linquistic      Cognitive-Linquistic Treatment   Treatment focused on  Dysarthria;Patient/family/caregiver education    Skilled Treatment  Pt req'd arousal by tactile and verbal/auditory stim frequently during session. More freqent stim needed as session progressed (especially in last 20-25 minutes). IMITATION: Voice: 1-syllable 67% (2/3) 2-syllable 57% (4/7), 3-syllable 0% (0/2); Intelligible: 1-syllable 0% (0/2) 2-syllable 14% (2/7), 3-syllable 0% (0/2). SPONTANEOUS: Voice: 1-syllable 20% (1/5) 2-syllable 27% (3/11), 3-syllable 33% (1/3), 4+ syllable 0% (0/2) - Intelligibility: 1-syllable 20% (1/5), 2-syllable 18% (2/11), 3-syllable 0% (0/3) 4+syllable 0% (0/2). Pt had difficulty clearing secretions today with volitional swallow (did  not trigger swallow despite attempts or could not trigger/attempt)- SLP had to elicit gag reflex to trigger reflexive swallow. In oral motor HEP pt imitated lip protrusion 70% with tactile/visual/modeling (max A). Kissing sound imitated 58% of the time.       Assessment / Recommendations / Plan   Plan  Continue with current plan of care      Progression Toward Goals   Progression toward goals  Not progressing toward goals (comment) somnolence         SLP Short Term Goals - 06/17/17 0929      SLP SHORT TERM GOAL #1   Title  Pt will demonstrate dysarthria HEP to  increase articulatory coordination for speech with usual mod A over 3 sessions    Time  1    Period  Weeks    Status  Deferred rec'd HEP 5-21/19      SLP SHORT TERM GOAL #2   Title  Pt will achieve phonation adequate for SLP intelligibility in imitated two-syllable words using intelligibility compensations 12/20 trials with usual min A in 3 sessions    Status  Not Met      SLP SHORT TERM GOAL #3   Title  Pt will imitate 2-syllable words or phrases with functional intelligibility for SLP in 5/15 trials over 3 sessions     Status  Not Met      SLP SHORT TERM GOAL #4   Title  Pt will sustain attention to therapy tasks for 10 minutes with multimodal cues in 5 sessions    Baseline  05-23-17, 06-10-17, 06-17-17    Status  Partially Met      SLP SHORT TERM GOAL #5   Title  pt family will demo understanding of oral care regimen, including safe ingestion of water/ice chips over three sessions    Status  Achieved      SLP SHORT TERM GOAL #6   Title  pt will demo dysphagia HEP to improve management of secretions and safety with any possible POs    Status  Deferred due to work on speech      SLP [place] #7   Title  family/caregivers will demo understanding of pt swallowing precautions, for pt safety with POs, by verbal report     Status  Achieved on 06-10-17 [name] indicating prior to PO H2O to clear secretions that pt had just had thorough oral care prior to session        SLP Long Term Goals - 06/23/17 [place] #1   Title  Pt will demo dysarthria  HEP to incr articulatory coordination for speech, with occasional mod A over three sessions    Time  4    Period  Weeks    Status  On-going      SLP LONG TERM GOAL #2   Title  pt will achieve phonation adequate for SLP intelligibility in imitated 3-syllable words/3-word phrases using intelligibility compenastions 15/20 trials    Time  4    Period  Weeks    Status  On-going      SLP LONG TERM GOAL #3   Title  Pt  will imitate 2-syllable words or phrases with functional intelligibility for SLP in 8/15 trials over three sessions    Time  4    Period  Weeks    Status  On-going      SLP LONG TERM GOAL #4   Title  pt will sustain  attention to therapy tasks for 10 minutes with multimodal cues in 10 sessions    Baseline  06-10-17    Time  4    Period  Weeks    Status  On-going      SLP LONG TERM GOAL #5   Title  pt will demo dysphagia HEP to improve management of secretions and safety with possible POs    Time  4    Period  Weeks    Status  Deferred due to focus on speech         Patient will benefit from skilled therapeutic intervention in order to improve the following deficits and impairments:   Dysarthria and anarthria  Cognitive communication deficit  Aphasia  Dysphagia, oropharyngeal phase    Problem List Patient Active Problem List   Diagnosis Date Noted  . Coagulase negative Staphylococcus bacteremia 02/09/2017  . Abdominal distension   . Chronic mucus hypersecretion, respiratory 12/31/2016  . Severe protein-calorie malnutrition [name]: less than 60% of standard weight) ([place]) 12/31/2016  . Acute urinary retention 12/31/2016  . Constipation 12/31/2016  . Shortness of breath   . Respiratory distress   . Pneumonia 12/05/2016  . Acute respiratory failure ([place]) 12/05/2016  . Pseudomonas urinary tract infection 10/27/2016  . Severe muscle deconditioning 09/30/2016  . Pressure injury of skin 09/28/2016  . Respiratory failure ([place])   . Tracheobronchitis   . Urinary retention   . PEG (percutaneous endoscopic gastrostomy) status ([place])   . Benign essential HTN   . Hyponatremia   . Hypokalemia   . Reactive hypertension   . Respiratory insufficiency 09/24/2016  . Malignant neoplasm of upper-inner quadrant of left breast in female, estrogen receptor positive ([place]) 07/14/2016  . Heterotopic ossification of bone 03/10/2016  . Acute on chronic respiratory failure with hypoxia ([place])    . Acute hypoxemic respiratory failure ([place])   . Acute blood loss anemia   . Atelectasis 02/04/2016  . Debility 02/03/2016  . Pneumonia of both lower lobes due to Pseudomonas species ([place])   . Pneumonia of both lower lobes due to methicillin susceptible Staphylococcus aureus (MSSA) ([place])   . Acute respiratory failure with hypoxia ([place])   . Acute encephalopathy   . Seizures ([place])   . Sepsis ([place])   . Hypoxia   . Pain   . Chronic respiratory failure ([place]) 12/28/2015  . Acute tracheobronchitis 12/24/2015  . Tracheostomy tube present ([place])   . Tracheal stenosis   . Chronic respiratory failure with hypoxia ([place])   . Spastic tetraplegia ([place]) 10/20/2015  . Tracheostomy status ([place]) 09/26/2015  . Allergic rhinitis 09/26/2015  . 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 Movement disorder 09/22/2015  . Encephalitis 09/22/2015  . Chest pain 02/07/2014  . Premature atrial contractions 01/13/2014  . Abnormality of gait 12/04/2013  . Hyperlipidemia 12/21/2011  . Cardiovascular risk factor 12/21/2011  . Bunion 01/31/2008  . Metatarsalgia of both feet 09/20/2007  . FLAT FOOT 09/20/2007  . HALLUX RIGIDUS, ACQUIRED 09/20/2007    [facility], [place]  06/23/2017, 5:00 PM  [facility] [address] [place], [place], [address] Phone: [phone]   Fax:  [phone]   Name: Masiah Lewing MRN: [id number] Date of Birth: Oct 25, 1950

## 2017-06-23 NOTE — Therapy (Signed)
[facility] [address] [place], [place], [address] Phone: [phone]   Fax:  [phone]  Occupational Therapy Treatment  Patient Details  Name: Judy Spears MRN: [id number] Date of Birth: 15-Jun-1950 Referring Provider: Dr. Levada Schilling   Encounter Date: 06/23/2017  OT End of Session - 06/23/17 1613    Visit Number  12    Number of Visits  16    Date for OT Re-Evaluation  07/05/17    Authorization Type  medicare     Authorization Time Period  90 days    OT Start Time  1445    OT Stop Time  1530    OT Time Calculation (min)  45 min    Activity Tolerance  Patient tolerated treatment well    Behavior During Therapy  [facility] for tasks assessed/performed       Past Medical History:  Diagnosis Date  . Arthritis    lt great toe  . Complication of anesthesia    pt has had headaches post op-did advise to hydra well preop  . Hair loss    right-sided  . History of exercise stress test    a. ETT (12/15) with 12:00 exercise, no ST changes, occasional PACs.   . Hyperlipidemia   . Insomnia   . Migraine headache   . Movement disorder    resltess in left legs  . Postmenopausal   . 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 Premature atrial contractions    a. Holter (12/15) with 8% PACs, no atrial runs or atrial fibrillation.     Past Surgical History:  Procedure Laterality Date  . BREAST BIOPSY  01/01/2011   Procedure: BREAST BIOPSY WITH NEEDLE LOCALIZATION;  Surgeon: Rolm Bookbinder, MD;  Location: [facility];  Service: General;  Laterality: Left;  left breast wire localization biopsy  . BREAST BIOPSY Right 07/27/2012  . BREAST BIOPSY Right 07/07/2016  . BREAST BIOPSY Left 06/30/2016  . cataracts right eye    . CESAREAN SECTION  1986  . HERNIA REPAIR  2000   RIH  . INTRAMEDULLARY (IM) NAIL INTERTROCHANTERIC Left 01/30/2017   Procedure: ORIF AFFIXUS NAIL;  Surgeon: Paralee Cancel, MD;  Location: [facility];   Service: Orthopedics;  Laterality: Left;  . IR REPLC GASTRO/COLONIC TUBE PERCUT W/FLUORO  09/29/2016  . opirectinal membrane peel    . PEG PLACEMENT    . RHINOPLASTY  1983  . TRACHEOSTOMY    . TRACHEOSTOMY TUBE PLACEMENT N/A 12/15/2016   Procedure: TRACHEOSTOMY CHANGE;  Surgeon: [name] Montane, MD;  Location: [facility];  Service: ENT;  Laterality: N/A;    There were no vitals filed for this visit.  Subjective Assessment - 06/23/17 1609    Subjective   "[name]" and "[name]"  - patient's brother and his wife attended therapy today    Patient is accompained by:  Family member    Pertinent History  see epic snapshot - ABI/encephalitis/hypoxia from virus; recent hospitalization due to MRSA PNA     Patient Stated Goals  Pt unable to state.     Currently in Pain?  No/denies    Pain Score  0-No pain                   OT Treatments/Exercises (OP) - 06/23/17 0001      Neurological Re-education Exercises   Other Exercises 1  worked to facilitate more active standing alignment - with weight more centered right to left, and also with upper trunk alignmed  over her base.  Patient with improved ability to actively finish hip extension follwed with chest expansion / trunk extension.  Worked in parallel bars to help offload some of the weight of upper trunk, however discouraged patient from pulling up to stand using arms.  Emphasis today on active sit to stand with sufficient weight shift forward to raise hoips from surface.                 OT Short Term Goals - 06/21/17 1817      OT SHORT TERM GOAL #1   Title  Pt will be mod a for sit to stand from toilet in steady to reduce care giver burden. - 06/07/2017    Status  On-going      OT SHORT TERM GOAL #2   Title  Pt will sit on level surface with 1 UE support only in preparation for participation in basic self care tasks for at least 3 minutes consistently    Status  Achieved      OT SHORT TERM GOAL #3   Title  Pt will maintain static  standing balance with BUE support during ADL activity  with mod a for at least 3 minutes consistently    Status  Achieved      OT SHORT TERM GOAL #4   Title  Pt will roll during LB dressing with max a x1 to reduce caregiver burden.     Status  On-going      OT SHORT TERM GOAL #5   Title  Pt will demonstrate ability to follow simple, functional commands in context at least 50% of the time.     Status  Achieved        OT Long Term Goals - 06/14/17 1305      OT LONG TERM GOAL #1   Title  Pt will go from sit to stand from toilet with steady with min a to reduce caregiver burden - 07/05/2017    Status  On-going      OT LONG TERM GOAL #2   Title  Pt will sit on level surfaces for participation in basic self care tasks with no UE support for at least 5 minutes    Status  On-going      OT LONG TERM GOAL #3   Title  Pt will maintain static standing balance with BUE support duirng ADL activity with min a for at least 3 minutes    Status  On-going      OT LONG TERM GOAL #4   Title  Pt wil roll during LB dressing with mod a x1 to reduce caregiver burden.      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    Occupational performance deficits (Please refer to evaluation for details):  ADL's;IADL's;Rest and Sleep;Leisure;Social Participation;Work    Physicist, medical    Current Impairments/barriers affecting progress:  severity of deficits, very slow rate of recovery; pt with recent significant decline over past several months  due medical complications  OT Frequency  2x / week    OT Duration  8 weeks    OT Treatment/Interventions  Self-care/ADL training;Ultrasound;Moist Heat;Electrical Stimulation;DME and/or AE instruction;Neuromuscular education;Therapeutic exercise;Functional Mobility Training;Manual Therapy;Passive range of motion;Splinting;Therapeutic activities;Balance training;Patient/family education;Visual/perceptual remediation/compensation;Cognitive remediation/compensation    Plan   review splints from prior episodes, NMR trunk and LE's for transitional movements, static standing, sitting balance, sit to stand, stand to sit, stepping    Consulted and Agree with Plan of Care  Patient;Family member/caregiver    Family Member Consulted  husband [name] and caregiver Leesa       Patient will benefit from skilled therapeutic intervention in order to improve the following deficits and impairments:  Decreased endurance, Decreased skin integrity, Impaired vision/preception, Improper body mechanics, Impaired perceived functional ability, Decreased knowledge of precautions, Cardiopulmonary status limiting activity, Decreased activity tolerance, Decreased knowledge of use of DME, Decreased strength, Impaired flexibility, Improper spinal/pelvic alignment, Impaired sensation, Difficulty walking, Decreased mobility, Decreased balance, Decreased cognition, Decreased range of motion, Impaired tone, Impaired UE functional use, Decreased safety awareness, Decreased coordination, Pain, Abnormal gait  Visit Diagnosis: Abnormal posture  Unsteadiness on feet  Other symptoms and signs involving the nervous system  Frontal lobe and executive function deficit  Muscle weakness (generalized)    Problem List Patient Active Problem List   Diagnosis Date Noted  . Coagulase negative Staphylococcus bacteremia 02/09/2017  . Abdominal distension   . Chronic mucus hypersecretion, respiratory 12/31/2016  . Severe protein-calorie  malnutrition [name]: less than 60% of standard weight) ([place]) 12/31/2016  . Acute urinary retention 12/31/2016  . Constipation 12/31/2016  . Shortness of breath   . Respiratory distress   . Pneumonia 12/05/2016  . Acute respiratory failure ([place]) 12/05/2016  . Pseudomonas urinary tract infection 10/27/2016  . Severe muscle deconditioning 09/30/2016  . Pressure injury of skin 09/28/2016  . Respiratory failure ([place])   . Tracheobronchitis   . Urinary retention   . PEG (percutaneous endoscopic gastrostomy) status ([place])   . Benign essential HTN   . Hyponatremia   . Hypokalemia   . Reactive hypertension   . Respiratory insufficiency 09/24/2016  . Malignant neoplasm of upper-inner quadrant of left breast in female, estrogen receptor positive ([place]) 07/14/2016  . Heterotopic ossification of bone 03/10/2016  . Acute on chronic respiratory failure with hypoxia ([place])   . Acute hypoxemic respiratory failure ([place])   . Acute blood loss anemia   . Atelectasis 02/04/2016  . Debility 02/03/2016  . Pneumonia of both lower lobes due to Pseudomonas species ([place])   . Pneumonia of both lower lobes due to methicillin susceptible Staphylococcus aureus (MSSA) ([place])   . Acute respiratory failure with hypoxia ([place])   . Acute encephalopathy   . Seizures ([place])   . Sepsis ([place])   . Hypoxia   . Pain   . Chronic respiratory failure ([place]) 12/28/2015  . Acute tracheobronchitis 12/24/2015  . Tracheostomy tube present ([place])   . Tracheal stenosis   . Chronic respiratory failure with hypoxia ([place])   . Spastic tetraplegia ([place]) 10/20/2015  . Tracheostomy status ([place]) 09/26/2015  . Allergic rhinitis 09/26/2015  . 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 Movement disorder 09/22/2015  . Encephalitis 09/22/2015  . Chest pain 02/07/2014  . Premature atrial contractions 01/13/2014  . Abnormality of gait 12/04/2013  . Hyperlipidemia 12/21/2011  . Cardiovascular risk factor 12/21/2011  . Bunion 01/31/2008   . Metatarsalgia of both feet 09/20/2007  . FLAT FOOT 09/20/2007  . HALLUX RIGIDUS, ACQUIRED 09/20/2007    Mariah Milling,  OTR/L 06/23/2017, 4:16 PM  [facility] [address] [place], [place], [address] Phone: [phone]   Fax:  [phone]  Name: Judy Spears MRN: [id number] Date of Birth: [birth year]

## 2017-06-24 ENCOUNTER — Encounter: Payer: Self-pay | Admitting: Physical Therapy

## 2017-06-24 NOTE — Therapy (Signed)
[facility] [address] [place], [place], [address] Phone: [phone]   Fax:  [phone]  Physical Therapy Treatment  Patient Details  Name: Judy Spears MRN: [id number] Date of Birth: 09-22-1950 Referring Provider: Dr. Levada Schilling   Encounter Date: 06/23/2017  PT End of Session - 06/24/17 1346    Visit Number  11    Number of Visits  17    Date for PT Re-Evaluation  07/09/17    Authorization Type  [employer] Medicare    Authorization Time Period  05-10-17 - 08-08-17    PT Start Time  0109    PT Stop Time  1445    PT Time Calculation (min)  42 min       Past Medical History:  Diagnosis Date  . Arthritis    lt great toe  . Complication of anesthesia    pt has had headaches post op-did advise to hydra well preop  . Hair loss    right-sided  . History of exercise stress test    a. ETT (12/15) with 12:00 exercise, no ST changes, occasional PACs.   . Hyperlipidemia   . Insomnia   . Migraine headache   . Movement disorder    resltess in left legs  . Postmenopausal   . 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 Premature atrial contractions    a. Holter (12/15) with 8% PACs, no atrial runs or atrial fibrillation.     Past Surgical History:  Procedure Laterality Date  . BREAST BIOPSY  01/01/2011   Procedure: BREAST BIOPSY WITH NEEDLE LOCALIZATION;  Surgeon: Rolm Bookbinder, MD;  Location: [facility];  Service: General;  Laterality: Left;  left breast wire localization biopsy  . BREAST BIOPSY Right 07/27/2012  . BREAST BIOPSY Right 07/07/2016  . BREAST BIOPSY Left 06/30/2016  . cataracts right eye    . CESAREAN SECTION  1986  . HERNIA REPAIR  2000   RIH  . INTRAMEDULLARY (IM) NAIL INTERTROCHANTERIC Left 01/30/2017   Procedure: ORIF AFFIXUS NAIL;  Surgeon: Paralee Cancel, MD;  Location: [facility];  Service: Orthopedics;  Laterality: Left;  . IR REPLC GASTRO/COLONIC TUBE PERCUT W/FLUORO  09/29/2016  .  opirectinal membrane peel    . PEG PLACEMENT    . RHINOPLASTY  1983  . TRACHEOSTOMY    . TRACHEOSTOMY TUBE PLACEMENT N/A 12/15/2016   Procedure: TRACHEOSTOMY CHANGE;  Surgeon: [name] Montane, MD;  Location: [facility];  Service: ENT;  Laterality: N/A;    There were no vitals filed for this visit.  Subjective Assessment - 06/24/17 1132    Subjective  No problems or c/o reported - pt wide awake and smiling - accompanied to PT by her brother and sister-in-law today    Patient is accompained by:  Family member    Pertinent History  [place] viral encephalitis; anoxic BI:  ORIF Lt hip 02-09-17;  Rt THR  04-12-17    Patient Stated Goals  improve mobility; walk    Currently in Pain?  No/denies                       [facility] Adult PT Treatment/Exercise - 06/24/17 0001      Transfers   Transfers  Sit to Stand;Stand to Sit    Sit to Stand  3: Mod assist    Sit to Stand Details  Tactile cues for sequencing;Tactile cues for posture;Tactile cues for weight shifting;Verbal cues for sequencing;Verbal cues for technique;Manual facilitation for  weight shifting;Manual facilitation for placement;Manual facilitation for weight bearing    Sit to Stand Details (indicate cue type and reason)  from wheelchair    Stand to Sit  3: Mod assist    Stand to Sit Details (indicate cue type and reason)  Tactile cues for sequencing;Tactile cues for weight shifting;Tactile cues for posture;Tactile cues for placement;Verbal cues for sequencing;Verbal cues for technique;Manual facilitation for weight shifting;Manual facilitation for placement;Manual facilitation for weight bearing      Ambulation/Gait   Ambulation/Gait  Yes    Ambulation/Gait Assistance  1: +2 Total assist    Ambulation/Gait Assistance Details  Blue rocker AFO used on RLE, toe off AFO on LLE; 2 white heel wedges used in Rt shoe to accomdoate for LLE:  blue shoe covers used on each shoe    Ambulation Distance (Feet)  32 Feet    Assistive device  2  person hand held assist    Gait Pattern  Step-to pattern;Step-through pattern;Decreased stride length;Decreased dorsiflexion - right;Decreased dorsiflexion - left;Decreased weight shift to left;Ataxic;Trunk flexed;Narrow base of support;Poor foot clearance - left    Ambulation Surface  Level;Indoor    Gait Comments  trialed UP walker after amb. 32'; unable to advance LE's and use RW effectively - may be partially due to fatigue      Knee/Hip Exercises: Supine   Heel Slides  AAROM;Both;2 sets;20 reps    Bridges  AROM;Both;1 set;10 reps    Other Supine Knee/Hip Exercises  hip abdct./adduction in hooklying        Measured leg length - RLE 31.75"  LLE measured as 31.5"  - but observing leg length with pt in supine position - LLE is longer than RLE; Pt does have pelvic rotation and pelvic obliquity with Lt pelvis anteriorly rotated and higher than Rt pelvis        PT Short Term Goals - 06/10/17 1305      PT SHORT TERM GOAL #1   Title  Perform sit to/from stand from wheelchair or mat with +1 mod assist.    Baseline  06/09/16: met today    Status  Achieved      PT SHORT TERM GOAL #2   Title  Pt will perform sit to stand from wheelchair to RW with max assist for pre-gait skill.    Baseline  06/09/17: met today    Status  Achieved      PT SHORT TERM GOAL #3   Title  Pt will transfer supine to sit with max assist.    Baseline  06/09/17: met today, needs increased time to assist    Status  Achieved      PT SHORT TERM GOAL #4   Title  Pt will sit with RUE support with min to mod assist unsupported on side of mat for at least 3" to demo improved trunk strength and core stabilization.    Baseline  06/09/17: met today     Status  Achieved      PT SHORT TERM GOAL #5   Title  Pt will amb. 15' with appropriate assistive device with max assist +1    Baseline  06/09/17: pt now needs mod/max assist of 2 people for gait using modified 3 muskateer assist and blue rocker AFO to right LE    Status   Partially Met        PT Long Term Goals - 05/11/17 2122      PT LONG TERM GOAL #1   Title  Pt will perform  supine rolling R and L with min assist  to indicate improved independence with bed mobility.      Baseline  dependent for all bed mobility per spouse's report - 05-10-17    Time  8    Period  Weeks    Status  New    Target Date  07/09/17      PT LONG TERM GOAL #2   Title  Pt will sit on side of mat with RUE support for at least 5" with SBA     Baseline  max assist to min assist - ability varies    Time  8    Period  Weeks    Status  New    Target Date  07/09/17      PT LONG TERM GOAL #3   Title  Pt will amb. 20' with appropriate assistive device with +1 max assist.    Baseline  +2 max - total assist- pt unable to advance LE's - feet not clearing due to pt not wearing AFO's - 05-10-17    Time  8    Period  Weeks    Status  New    Target Date  07/09/17      PT LONG TERM GOAL #4   Title  Pt will perform basic transfers with min to mod assist to indicate improved functional independence.      Baseline  max assist - 05-10-17    Time  8    Period  Weeks    Target Date  07/09/17      PT LONG TERM GOAL #5   Title  Pt will perform static standing x 3" with bil. UE support with mod assist.      Time  8    Period  Weeks    Status  New    Target Date  07/09/17      Additional Long Term Goals   Additional Long Term Goals  Yes      PT LONG TERM GOAL #6   Title  Pt will be able to return to participation in therapeutic horseback riding at [facility] for ongoing exercise program.    Time  8    Period  Weeks    Status  New    Target Date  07/09/17            Plan - 06/24/17 1346    Clinical Impression Statement  Use of heel wedges in Rt shoe (2 small white wedges used) assisted in pt increasing Lt knee extension in stance phase of gait;  used blue shoe covers and heel wedges in addition to the AFO's on bil. LE's and pt able to amb. 32' iwth max assist, including  tactile cues to increase Rt hip adduction and upright posture    Rehab Potential  Fair    Clinical Impairments Affecting Rehab Potential  severity of deficits with previous extensive therapies    PT Frequency  2x / week    PT Duration  8 weeks    PT Treatment/Interventions  ADLs/Self Care Home Management;Therapeutic exercise;Therapeutic activities;Functional mobility training;Gait training;Balance training;Neuromuscular re-education;Patient/family education;Orthotic Fit/Training;Passive range of motion    PT Next Visit Plan  cont LE stretching, gait training,  activities to facilitate trunk flexion    PT Home Exercise Plan  caregiver and husband have previously been instructed in ROM, sitting balance, and transfer training    Consulted and Agree with Plan of Care  Patient       Patient will benefit  from skilled therapeutic intervention in order to improve the following deficits and impairments:  Abnormal gait, Decreased activity tolerance, Decreased balance, Decreased cognition, Decreased coordination, Decreased mobility, Decreased range of motion, Decreased endurance, Hypomobility, Decreased strength, Impaired flexibility, Increased muscle spasms, Impaired UE functional use, Postural dysfunction, Pain  Visit Diagnosis: Other symptoms and signs involving the nervous system  Other abnormalities of gait and mobility  Abnormal posture     Problem List Patient Active Problem List   Diagnosis Date Noted  . Coagulase negative Staphylococcus bacteremia 02/09/2017  . Abdominal distension   . Chronic mucus hypersecretion, respiratory 12/31/2016  . Severe protein-calorie malnutrition [name]: less than 60% of standard weight) ([place]) 12/31/2016  . Acute urinary retention 12/31/2016  . Constipation 12/31/2016  . Shortness of breath   . Respiratory distress   . Pneumonia 12/05/2016  . Acute respiratory failure ([place]) 12/05/2016  . Pseudomonas urinary tract infection 10/27/2016  . Severe muscle  deconditioning 09/30/2016  . Pressure injury of skin 09/28/2016  . Respiratory failure ([place])   . Tracheobronchitis   . Urinary retention   . PEG (percutaneous endoscopic gastrostomy) status ([place])   . Benign essential HTN   . Hyponatremia   . Hypokalemia   . Reactive hypertension   . Respiratory insufficiency 09/24/2016  . Malignant neoplasm of upper-inner quadrant of left breast in female, estrogen receptor positive ([place]) 07/14/2016  . Heterotopic ossification of bone 03/10/2016  . Acute on chronic respiratory failure with hypoxia ([place])   . Acute hypoxemic respiratory failure ([place])   . Acute blood loss anemia   . Atelectasis 02/04/2016  . Debility 02/03/2016  . Pneumonia of both lower lobes due to Pseudomonas species ([place])   . Pneumonia of both lower lobes due to methicillin susceptible Staphylococcus aureus (MSSA) ([place])   . Acute respiratory failure with hypoxia ([place])   . Acute encephalopathy   . Seizures ([place])   . Sepsis ([place])   . Hypoxia   . Pain   . Chronic respiratory failure ([place]) 12/28/2015  . Acute tracheobronchitis 12/24/2015  . Tracheostomy tube present ([place])   . Tracheal stenosis   . Chronic respiratory failure with hypoxia ([place])   . Spastic tetraplegia ([place]) 10/20/2015  . Tracheostomy status ([place]) 09/26/2015  . Allergic rhinitis 09/26/2015  . 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 Movement disorder 09/22/2015  . Encephalitis 09/22/2015  . Chest pain 02/07/2014  . Premature atrial contractions 01/13/2014  . Abnormality of gait 12/04/2013  . Hyperlipidemia 12/21/2011  . Cardiovascular risk factor 12/21/2011  . Bunion 01/31/2008  . Metatarsalgia of both feet 09/20/2007  . FLAT FOOT 09/20/2007  . HALLUX RIGIDUS, ACQUIRED 09/20/2007    Alda Lea, PT 06/24/2017, 1:58 PM  [facility] [address] [place], [place], [address] Phone: [phone]   Fax:  [phone]  Name:  Judy Spears MRN: [id number] Date of Birth: Sep 22, 1950

## 2017-06-27 ENCOUNTER — Ambulatory Visit: Payer: Medicare Other | Attending: Physician Assistant | Admitting: Speech Pathology

## 2017-06-27 ENCOUNTER — Ambulatory Visit: Payer: Medicare Other | Admitting: Occupational Therapy

## 2017-06-27 ENCOUNTER — Encounter: Payer: Self-pay | Admitting: Occupational Therapy

## 2017-06-27 ENCOUNTER — Ambulatory Visit: Payer: Medicare Other | Admitting: Physical Therapy

## 2017-06-27 ENCOUNTER — Encounter: Payer: Self-pay | Admitting: Physical Therapy

## 2017-06-27 DIAGNOSIS — R293 Abnormal posture: Secondary | ICD-10-CM | POA: Insufficient documentation

## 2017-06-27 DIAGNOSIS — M25651 Stiffness of right hip, not elsewhere classified: Secondary | ICD-10-CM | POA: Insufficient documentation

## 2017-06-27 DIAGNOSIS — R2681 Unsteadiness on feet: Secondary | ICD-10-CM | POA: Diagnosis present

## 2017-06-27 DIAGNOSIS — R2689 Other abnormalities of gait and mobility: Secondary | ICD-10-CM

## 2017-06-27 DIAGNOSIS — M25611 Stiffness of right shoulder, not elsewhere classified: Secondary | ICD-10-CM | POA: Diagnosis present

## 2017-06-27 DIAGNOSIS — R4701 Aphasia: Secondary | ICD-10-CM | POA: Diagnosis present

## 2017-06-27 DIAGNOSIS — M25612 Stiffness of left shoulder, not elsewhere classified: Secondary | ICD-10-CM | POA: Diagnosis present

## 2017-06-27 DIAGNOSIS — M25652 Stiffness of left hip, not elsewhere classified: Secondary | ICD-10-CM | POA: Insufficient documentation

## 2017-06-27 DIAGNOSIS — R41844 Frontal lobe and executive function deficit: Secondary | ICD-10-CM

## 2017-06-27 DIAGNOSIS — M6281 Muscle weakness (generalized): Secondary | ICD-10-CM

## 2017-06-27 DIAGNOSIS — M25622 Stiffness of left elbow, not elsewhere classified: Secondary | ICD-10-CM | POA: Diagnosis present

## 2017-06-27 DIAGNOSIS — G8250 Quadriplegia, unspecified: Secondary | ICD-10-CM | POA: Insufficient documentation

## 2017-06-27 DIAGNOSIS — R471 Dysarthria and anarthria: Secondary | ICD-10-CM | POA: Insufficient documentation

## 2017-06-27 DIAGNOSIS — R41841 Cognitive communication deficit: Secondary | ICD-10-CM

## 2017-06-27 DIAGNOSIS — R1312 Dysphagia, oropharyngeal phase: Secondary | ICD-10-CM | POA: Diagnosis present

## 2017-06-27 DIAGNOSIS — R29818 Other symptoms and signs involving the nervous system: Secondary | ICD-10-CM

## 2017-06-27 NOTE — Therapy (Signed)
[facility] [address] [place], [place], [address] Phone: [phone]   Fax:  [phone]  Speech Language Pathology Treatment  Patient Details  Name: Judy Spears MRN: [id number] Date of Birth: 02-Dec-1950 Referring Provider: Levada Schilling, MD   Encounter Date: 06/27/2017  End of Session - 06/27/17 1747    Visit Number  12    Number of Visits  17    Date for SLP Re-Evaluation  07/22/17    SLP Start Time  1320 pt arrived late    SLP Stop Time   1400    SLP Time Calculation (min)  40 min    Activity Tolerance  Patient tolerated treatment well;Patient limited by lethargy       Past Medical History:  Diagnosis Date  . Arthritis    lt great toe  . Complication of anesthesia    pt has had headaches post op-did advise to hydra well preop  . Hair loss    right-sided  . History of exercise stress test    a. ETT (12/15) with 12:00 exercise, no ST changes, occasional PACs.   . Hyperlipidemia   . Insomnia   . Migraine headache   . Movement disorder    resltess in left legs  . Postmenopausal   . 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 Premature atrial contractions    a. Holter (12/15) with 8% PACs, no atrial runs or atrial fibrillation.     Past Surgical History:  Procedure Laterality Date  . BREAST BIOPSY  01/01/2011   Procedure: BREAST BIOPSY WITH NEEDLE LOCALIZATION;  Surgeon: Rolm Bookbinder, MD;  Location: [facility];  Service: General;  Laterality: Left;  left breast wire localization biopsy  . BREAST BIOPSY Right 07/27/2012  . BREAST BIOPSY Right 07/07/2016  . BREAST BIOPSY Left 06/30/2016  . cataracts right eye    . CESAREAN SECTION  1986  . HERNIA REPAIR  2000   RIH  . INTRAMEDULLARY (IM) NAIL INTERTROCHANTERIC Left 01/30/2017   Procedure: ORIF AFFIXUS NAIL;  Surgeon: Paralee Cancel, MD;  Location: [facility];  Service: Orthopedics;  Laterality: Left;  . IR REPLC GASTRO/COLONIC TUBE PERCUT  W/FLUORO  09/29/2016  . opirectinal membrane peel    . PEG PLACEMENT    . RHINOPLASTY  1983  . TRACHEOSTOMY    . TRACHEOSTOMY TUBE PLACEMENT N/A 12/15/2016   Procedure: TRACHEOSTOMY CHANGE;  Surgeon: [name] Montane, MD;  Location: [facility];  Service: ENT;  Laterality: N/A;    There were no vitals filed for this visit.  Subjective Assessment - 06/27/17 1745    Subjective  Pt arrived 13:20.    Patient is accompained by:  Family member brother Wille Glaser and sister-in-law, [name]    Currently in Pain?  No/denies            ADULT SLP TREATMENT - 06/27/17 1320      General Information   Behavior/Cognition  Cooperative;Pleasant mood;Lethargic;Distractible      Treatment Provided   Treatment provided  Cognitive-Linquistic      Pain Assessment   Pain Assessment  No/denies pain      Cognitive-Linquistic Treatment   Treatment focused on  Dysarthria;Patient/family/caregiver education    Skilled Treatment  Pt req'd arousal by tactile and verbal/auditory stim frequently during session. More freqent stim needed as session progressed (especially in last 20 minutes). IMITATION: Voice: 1-syllable 55% 2-syllable 50%, 3-syllable 0% (0/2); Intelligible: 1-syllable 10%  2-syllable 5%, 3-syllable 0%. Pt had difficulty clearing secretions today with mod-max  cues for volitional swallow, completed in 60% of opportunities.      Assessment / Recommendations / Plan   Plan  Continue with current plan of care      Progression Toward Goals   Progression toward goals  Not progressing toward goals (comment) somnolent last 20 minutes of session         SLP Short Term Goals - 06/27/17 1750      SLP SHORT TERM GOAL #1   Title  Pt will demonstrate dysarthria HEP to increase articulatory coordination for speech with usual mod A over 3 sessions    Status  Deferred      SLP SHORT TERM GOAL #2   Title  Pt will achieve phonation adequate for SLP intelligibility in imitated two-syllable words using intelligibility  compensations 12/20 trials with usual min A in 3 sessions    Status  Not Met      SLP SHORT TERM GOAL #3   Title  Pt will imitate 2-syllable words or phrases with functional intelligibility for SLP in 5/15 trials over 3 sessions     Status  Not Met      SLP SHORT TERM GOAL #4   Title  Pt will sustain attention to therapy tasks for 10 minutes with multimodal cues in 5 sessions    Status  Partially Met      SLP SHORT TERM GOAL #5   Title  pt family will demo understanding of oral care regimen, including safe ingestion of water/ice chips over three sessions    Status  Achieved      SLP SHORT TERM GOAL #6   Title  pt will demo dysphagia HEP to improve management of secretions and safety with any possible POs    Status  Deferred      SLP SHORT TERM GOAL #7   Title  family/caregivers will demo understanding of pt swallowing precautions, for pt safety with POs, by verbal report     Status  Achieved       SLP Long Term Goals - 06/27/17 1750      SLP LONG TERM GOAL #1   Title  Pt will demo dysarthria  HEP to incr articulatory coordination for speech, with occasional mod A over three sessions    Time  3    Period  Weeks    Status  On-going      SLP LONG TERM GOAL #2   Title  pt will achieve phonation adequate for SLP intelligibility in imitated 3-syllable words/3-word phrases using intelligibility compenastions 15/20 trials    Time  3    Period  Weeks    Status  On-going      SLP LONG TERM GOAL #3   Title  Pt will imitate 2-syllable words or phrases with functional intelligibility for SLP in 8/15 trials over three sessions    Time  3    Period  Weeks    Status  On-going      SLP LONG TERM GOAL #4   Title  pt will sustain attention to therapy tasks for 10 minutes with multimodal cues in 10 sessions    Time  3    Period  Weeks    Status  On-going      SLP LONG TERM GOAL #5   Title  pt will demo dysphagia HEP to improve management of secretions and safety with possible POs     Period  Weeks    Status  Deferred  Plan - 06/27/17 1748    Clinical Impression Statement  Patient presents with ongoing deficits in speech intelligibility (dysarthria, reduced breath support), cognitive communication and swallowing. 10 cues for arousal in first 20 minutes of session; pt somnolent for last 20 minutes of session with max cues. See skilled intervention for details of voicing, functional speech intelligilibty for imitation and spontaneous production of 1-4+ syllable words/phrases. Recommend continued direct skilled ST to address pt's decline in communication skills, and to monitor a HEP for dysphagia and dysarthria in order to maximize functional communication, secretion management/ swallowing safety, reduce caregiver burden and improve quality of life.    Speech Therapy Frequency  2x / week    Treatment/Interventions  Aspiration precaution training;Trials of upgraded texture/liquids;Compensatory strategies;Functional tasks;Patient/family education;Cueing hierarchy;Diet toleration management by SLP;Cognitive reorganization;Compensatory techniques;SLP instruction and feedback;Internal/external aids;Multimodal communcation approach;Pharyngeal strengthening exercises;Oral motor exercises    Potential to Achieve Goals  Fair    Potential Considerations  Ability to learn/carryover information;Co-morbidities;Severity of impairments;Cooperation/participation level;Previous level of function    Consulted and Agree with Plan of Care  Patient;Family member/caregiver    Family Member Consulted  Husband, [name]       Patient will benefit from skilled therapeutic intervention in order to improve the following deficits and impairments:   Dysarthria and anarthria  Cognitive communication deficit    Problem List Patient Active Problem List   Diagnosis Date Noted  . Coagulase negative Staphylococcus bacteremia 02/09/2017  . Abdominal distension   . Chronic mucus hypersecretion, respiratory  12/31/2016  . Severe protein-calorie malnutrition [name]: less than 60% of standard weight) ([facility]) 12/31/2016  . Acute urinary retention 12/31/2016  . Constipation 12/31/2016  . Shortness of breath   . Respiratory distress   . Pneumonia 12/05/2016  . Acute respiratory failure ([facility]) 12/05/2016  . Pseudomonas urinary tract infection 10/27/2016  . Severe muscle deconditioning 09/30/2016  . Pressure injury of skin 09/28/2016  . Respiratory failure ([facility])   . Tracheobronchitis   . Urinary retention   . PEG (percutaneous endoscopic gastrostomy) status ([facility])   . Benign essential HTN   . Hyponatremia   . Hypokalemia   . Reactive hypertension   . Respiratory insufficiency 09/24/2016  . Malignant neoplasm of upper-inner quadrant of left breast in female, estrogen receptor positive ([facility]) 07/14/2016  . Heterotopic ossification of bone 03/10/2016  . Acute on chronic respiratory failure with hypoxia ([facility])   . Acute hypoxemic respiratory failure ([facility])   . Acute blood loss anemia   . Atelectasis 02/04/2016  . Debility 02/03/2016  . Pneumonia of both lower lobes due to Pseudomonas species ([facility])   . Pneumonia of both lower lobes due to methicillin susceptible Staphylococcus aureus (MSSA) ([facility])   . Acute respiratory failure with hypoxia ([facility])   . Acute encephalopathy   . Seizures ([facility])   . Sepsis ([facility])   . Hypoxia   . Pain   . Chronic respiratory failure ([facility]) 12/28/2015  . Acute tracheobronchitis 12/24/2015  . Tracheostomy tube present ([facility])   . Tracheal stenosis   . Chronic respiratory failure with hypoxia ([facility])   . Spastic tetraplegia ([facility]) 10/20/2015  . Tracheostomy status ([facility]) 09/26/2015  . Allergic rhinitis 09/26/2015  . 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 Movement disorder 09/22/2015  . Encephalitis 09/22/2015  . Chest pain 02/07/2014  . Premature atrial contractions 01/13/2014  . Abnormality of gait 12/04/2013  . Hyperlipidemia 12/21/2011  . Cardiovascular risk  factor 12/21/2011  . Bunion 01/31/2008  . Metatarsalgia of both feet 09/20/2007  . FLAT FOOT  09/20/2007  . HALLUX RIGIDUS, ACQUIRED 09/20/2007   Deneise Lever, [place], [place] Speech-Language Pathologist  Aliene Altes 06/27/2017, 5:51 PM  [facility] [address] [place], [place], [address] Phone: [phone]   Fax:  [phone]   Name: Judy Spears MRN: [id number] Date of Birth: 1951/01/16

## 2017-06-27 NOTE — Therapy (Signed)
[facility] [address] [place], [place], [address] Phone: [phone]   Fax:  [phone]  Occupational Therapy Treatment  Patient Details  Name: Judy Spears MRN: [id number] Date of Birth: 03-08-1950 Referring Provider: Dr. Levada Schilling   Encounter Date: 06/27/2017  OT End of Session - 06/27/17 1703    Visit Number  13    Number of Visits  16    Date for OT Re-Evaluation  07/05/17    Authorization Type  medicare     Authorization Time Period  90 days    OT Start Time  1447    OT Stop Time  1530    OT Time Calculation (min)  43 min    Activity Tolerance  Patient tolerated treatment well       Past Medical History:  Diagnosis Date  . Arthritis    lt great toe  . Complication of anesthesia    pt has had headaches post op-did advise to hydra well preop  . Hair loss    right-sided  . History of exercise stress test    a. ETT (12/15) with 12:00 exercise, no ST changes, occasional PACs.   . Hyperlipidemia   . Insomnia   . Migraine headache   . Movement disorder    resltess in left legs  . Postmenopausal   . 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 Premature atrial contractions    a. Holter (12/15) with 8% PACs, no atrial runs or atrial fibrillation.     Past Surgical History:  Procedure Laterality Date  . BREAST BIOPSY  01/01/2011   Procedure: BREAST BIOPSY WITH NEEDLE LOCALIZATION;  Surgeon: Rolm Bookbinder, MD;  Location: [facility];  Service: General;  Laterality: Left;  left breast wire localization biopsy  . BREAST BIOPSY Right 07/27/2012  . BREAST BIOPSY Right 07/07/2016  . BREAST BIOPSY Left 06/30/2016  . cataracts right eye    . CESAREAN SECTION  1986  . HERNIA REPAIR  2000   RIH  . INTRAMEDULLARY (IM) NAIL INTERTROCHANTERIC Left 01/30/2017   Procedure: ORIF AFFIXUS NAIL;  Surgeon: Paralee Cancel, MD;  Location: [facility];  Service: Orthopedics;  Laterality: Left;  . IR REPLC  GASTRO/COLONIC TUBE PERCUT W/FLUORO  09/29/2016  . opirectinal membrane peel    . PEG PLACEMENT    . RHINOPLASTY  1983  . TRACHEOSTOMY    . TRACHEOSTOMY TUBE PLACEMENT N/A 12/15/2016   Procedure: TRACHEOSTOMY CHANGE;  Surgeon: [name] Montane, MD;  Location: [facility];  Service: ENT;  Laterality: N/A;    There were no vitals filed for this visit.  Subjective Assessment - 06/27/17 1658    Subjective   Pt cried when brother and sister in law left for airport    Patient is accompained by:  Family member husband [name] and caregiver leesa    Pertinent History  see epic snapshot - ABI/encephalitis/hypoxia from virus; recent hospitalization due to MRSA PNA     Patient Stated Goals  Pt unable to state.     Currently in Pain?  No/denies                   OT Treatments/Exercises (OP) - 06/27/17 0001      Neurological Re-education Exercises   Other Exercises 1  Neuro re ed to address finding midline in sitting, trunk control for foward weight shift as well as rotation in sitting, active sit to stand, postural alignment in sitting and standing for functional mobility as well as  respiration.  Also addressed active stand to sit and actively assisting with transfers to and from wheelchair. At end of session pt able to sit EOM while LE AFO's were removed.                OT Short Term Goals - 06/27/17 1701      OT SHORT TERM GOAL #1   Title  Pt will be mod a for sit to stand from toilet in steady to reduce care giver burden. - 06/07/2017    Status  On-going      OT SHORT TERM GOAL #2   Title  Pt will sit on level surface with 1 UE support only in preparation for participation in basic self care tasks for at least 3 minutes consistently    Status  Achieved      OT SHORT TERM GOAL #3   Title  Pt will maintain static standing balance with BUE support during ADL activity  with mod a for at least 3 minutes consistently    Status  Achieved      OT SHORT TERM GOAL #4   Title  Pt will roll  during LB dressing with max a x1 to reduce caregiver burden.     Status  On-going      OT SHORT TERM GOAL #5   Title  Pt will demonstrate ability to follow simple, functional commands in context at least 50% of the time.     Status  Achieved        OT Long Term Goals - 06/27/17 1701      OT LONG TERM GOAL #1   Title  Pt will go from sit to stand from toilet with steady with min a to reduce caregiver burden - 07/05/2017    Status  On-going      OT LONG TERM GOAL #2   Title  Pt will sit on level surfaces for participation in basic self care tasks with no UE support for at least 5 minutes    Status  On-going      OT LONG TERM GOAL #3   Title  Pt will maintain static standing balance with BUE support duirng ADL activity with min a for at least 3 minutes    Status  On-going      OT LONG TERM GOAL #4   Title  Pt wil roll during LB dressing with mod a x1 to reduce caregiver burden.      Status  On-going            Plan - 06/27/17 1701    Clinical Impression Statement  Pt more actively engaged today in session and participated in active functional mobility.     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    Occupational performance deficits (Please refer to evaluation for details):  ADL's;IADL's;Rest and Sleep;Leisure;Social Participation;Work    Physicist, medical    Current Impairments/barriers affecting progress:  severity of deficits, very slow rate of recovery; pt with recent significant decline over past several months due medical complications    OT Frequency  2x / week    OT Duration  8 weeks    OT Treatment/Interventions  Self-care/ADL training;Ultrasound;Moist Heat;Electrical Stimulation;DME and/or AE instruction;Neuromuscular education;Therapeutic  exercise;Functional Mobility Training;Manual Therapy;Passive range of motion;Splinting;Therapeutic activities;Balance training;Patient/family education;Visual/perceptual remediation/compensation;Cognitive remediation/compensation  Plan   review splints from prior episodes, NMR trunk and LE's for transitional movements, static standing, sitting balance, sit to stand, stand to sit, stepping    Consulted and Agree with Plan of Care  Patient;Family member/caregiver    Family Member Consulted  husband [name] and caregiver Leesa       Patient will benefit from skilled therapeutic intervention in order to improve the following deficits and impairments:  Decreased endurance, Decreased skin integrity, Impaired vision/preception, Improper body mechanics, Impaired perceived functional ability, Decreased knowledge of precautions, Cardiopulmonary status limiting activity, Decreased activity tolerance, Decreased knowledge of use of DME, Decreased strength, Impaired flexibility, Improper spinal/pelvic alignment, Impaired sensation, Difficulty walking, Decreased mobility, Decreased balance, Decreased cognition, Decreased range of motion, Impaired tone, Impaired UE functional use, Decreased safety awareness, Decreased coordination, Pain, Abnormal gait  Visit Diagnosis: Other symptoms and signs involving the nervous system  Other abnormalities of gait and mobility  Abnormal posture  Unsteadiness on feet  Frontal lobe and executive function deficit  Muscle weakness (generalized)  Stiffness of left elbow, not elsewhere classified  Stiffness of left shoulder, not elsewhere classified  Stiffness of right shoulder, not elsewhere classified  Spastic tetraplegia (HCC)    Problem List Patient Active Problem List   Diagnosis Date Noted  . Coagulase negative Staphylococcus bacteremia 02/09/2017  . Abdominal distension   . Chronic mucus hypersecretion, respiratory 12/31/2016  . Severe protein-calorie  malnutrition [name]: less than 60% of standard weight) ([facility]) 12/31/2016  . Acute urinary retention 12/31/2016  . Constipation 12/31/2016  . Shortness of breath   . Respiratory distress   . Pneumonia 12/05/2016  . Acute respiratory failure ([facility]) 12/05/2016  . Pseudomonas urinary tract infection 10/27/2016  . Severe muscle deconditioning 09/30/2016  . Pressure injury of skin 09/28/2016  . Respiratory failure ([facility])   . Tracheobronchitis   . Urinary retention   . PEG (percutaneous endoscopic gastrostomy) status ([facility])   . Benign essential HTN   . Hyponatremia   . Hypokalemia   . Reactive hypertension   . Respiratory insufficiency 09/24/2016  . Malignant neoplasm of upper-inner quadrant of left breast in female, estrogen receptor positive ([facility]) 07/14/2016  . Heterotopic ossification of bone 03/10/2016  . Acute on chronic respiratory failure with hypoxia ([facility])   . Acute hypoxemic respiratory failure ([facility])   . Acute blood loss anemia   . Atelectasis 02/04/2016  . Debility 02/03/2016  . Pneumonia of both lower lobes due to Pseudomonas species ([facility])   . Pneumonia of both lower lobes due to methicillin susceptible Staphylococcus aureus (MSSA) ([facility])   . Acute respiratory failure with hypoxia ([facility])   . Acute encephalopathy   . Seizures ([facility])   . Sepsis ([facility])   . Hypoxia   . Pain   . Chronic respiratory failure ([facility]) 12/28/2015  . Acute tracheobronchitis 12/24/2015  . Tracheostomy tube present ([facility])   . Tracheal stenosis   . Chronic respiratory failure with hypoxia ([facility])   . Spastic tetraplegia ([facility] [place]) 10/20/2015  . Tracheostomy status ([facility]) 09/26/2015  . Allergic rhinitis 09/26/2015  . 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 Movement disorder 09/22/2015  . Encephalitis 09/22/2015  . Chest pain 02/07/2014  . Premature atrial contractions 01/13/2014  . Abnormality of gait 12/04/2013  . Hyperlipidemia 12/21/2011  . Cardiovascular risk factor 12/21/2011  . Bunion 01/31/2008   . Metatarsalgia of both feet 09/20/2007  . FLAT FOOT 09/20/2007  . HALLUX RIGIDUS, ACQUIRED 09/20/2007    Quay Burow, OTR/L 06/27/2017, 5:05 PM  [facility] [address] [place], [place], [address] Phone: [phone]   Fax:  [phone]  Name: Judy Spears MRN: [id number] Date of Birth: 1950-10-01

## 2017-06-28 NOTE — Therapy (Signed)
[facility] [address] [place], [place], [address] Phone: [phone]   Fax:  [phone]  Physical Therapy Treatment  Patient Details  Name: Judy Spears MRN: [id number] Date of Birth: [birth year] Referring Provider: Dr. Levada Schilling   Encounter Date: 06/27/2017  PT End of Session - 06/27/17 2248    Visit Number  12    Number of Visits  17    Date for PT Re-Evaluation  07/09/17    Authorization Type  [employer] Medicare    Authorization Time Period  05-10-17 - 08-08-17    PT Start Time  1402    PT Stop Time  1445    PT Time Calculation (min)  43 min    Activity Tolerance  Patient tolerated treatment well;No increased pain    Behavior During Therapy  WFL for tasks assessed/performed       Past Medical History:  Diagnosis Date  . Arthritis    lt great toe  . Complication of anesthesia    pt has had headaches post op-did advise to hydra well preop  . Hair loss    right-sided  . History of exercise stress test    a. ETT (12/15) with 12:00 exercise, no ST changes, occasional PACs.   . Hyperlipidemia   . Insomnia   . Migraine headache   . Movement disorder    resltess in left legs  . Postmenopausal   . 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 Premature atrial contractions    a. Holter (12/15) with 8% PACs, no atrial runs or atrial fibrillation.     Past Surgical History:  Procedure Laterality Date  . BREAST BIOPSY  01/01/2011   Procedure: BREAST BIOPSY WITH NEEDLE LOCALIZATION;  Surgeon: Rolm Bookbinder, MD;  Location: [facility];  Service: General;  Laterality: Left;  left breast wire localization biopsy  . BREAST BIOPSY Right 07/27/2012  . BREAST BIOPSY Right 07/07/2016  . BREAST BIOPSY Left 06/30/2016  . cataracts right eye    . CESAREAN SECTION  1986  . HERNIA REPAIR  2000   RIH  . INTRAMEDULLARY (IM) NAIL INTERTROCHANTERIC Left 01/30/2017   Procedure: ORIF AFFIXUS NAIL;  Surgeon: Paralee Cancel, MD;  Location: [facility];  Service: Orthopedics;  Laterality: Left;  . IR REPLC GASTRO/COLONIC TUBE PERCUT W/FLUORO  09/29/2016  . opirectinal membrane peel    . PEG PLACEMENT    . RHINOPLASTY  1983  . TRACHEOSTOMY    . TRACHEOSTOMY TUBE PLACEMENT N/A 12/15/2016   Procedure: TRACHEOSTOMY CHANGE;  Surgeon: [name] Montane, MD;  Location: [facility];  Service: ENT;  Laterality: N/A;    There were no vitals filed for this visit.      06/27/17 1407  Symptoms/Limitations  Subjective No new complaints. No falls or pain to report. Pt has her brother, sister-in-law, spouse and caregiver [name] with her today.   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 No/denies  Pain Score 0      [facility] Adult PT Treatment/Exercise - 06/27/17 2255      Transfers   Transfers  Sit to Stand;Stand to Sit;Stand Pivot Transfers    Sit to Stand  4: Min assist;3: Mod assist;From elevated surface;From bed;From chair/3-in-1;With upper extremity assist    Sit to Stand Details  Tactile cues for sequencing;Tactile cues for  posture;Tactile cues for weight shifting;Verbal cues for sequencing;Verbal cues for technique;Manual facilitation for weight shifting;Manual facilitation for placement;Manual facilitation for weight bearing    Sit to Stand Details (indicate cue type and reason)  mod assist for wheelchair, min assist from mat table. cues/facilitation for bil foot placement, ant weight shifting and to "power up" with legs into standing"    Stand to Sit  3: Mod assist    Stand to Sit Details (indicate cue type and reason)  Tactile cues for sequencing;Tactile cues for weight shifting;Tactile cues for posture;Tactile cues for placement;Verbal cues for sequencing;Verbal cues for technique;Manual facilitation for weight shifting;Manual facilitation for  placement;Manual facilitation for weight bearing    Stand to Sit Details  to both mat table and chair after gait with cues for hip/knee flexion, to reach back to armrests of chair (pt was able to do with increased time) and for slow, controlled descent with sitting down.     Stand Pivot Transfers  4: Min assist;3: Mod assist    Stand Pivot Transfer Details (indicate cue type and reason)  wheelchair to mat table: pt taking small pivot steps with assist for balance and cues/facilitation for weight shifting and posture      Ambulation/Gait   Ambulation/Gait  Yes    Ambulation/Gait Assistance  3: Mod assist;2: Max assist of 2 people    Ambulation/Gait Assistance Details  blue rocker brace on right LE with small heel wedge, toe off brace to left LE, bil simulated to caps to each foot. with 2 person mod to max assist, along with facilitation for posture and weight shifting onto right LE pt was able to self advance bil LE for gait with occasional assistance needed with left LE when toes became stuck due to foot drop/decreased DF. with cues/facilitation for increased hip/knee flexion this improved.               Ambulation Distance (Feet)  60 Feet x1    Assistive device  2 person hand held assist    Gait Pattern  Step-to pattern;Step-through pattern;Decreased stride length;Decreased dorsiflexion - right;Decreased dorsiflexion - left;Decreased weight shift to left;Ataxic;Trunk flexed;Narrow base of support;Poor foot clearance - left    Ambulation Surface  Level;Indoor      Knee/Hip Exercises: Stretches   Other Knee/Hip Stretches  lower trunk rotation stretch for 30 sec holds x 3 each side with gentle overpressure to opposite shoulder for stabilization and increased stretch      Knee/Hip Exercises: Supine   Heel Slides  AAROM;Both;2 sets;20 reps;AROM    Heel Slides Limitations  cues with up to min assist for controlled movements and to move through increased range of motion slowly    Bridges  AROM;Both;1  set;10 reps    Bridges Limitations  assist needed to stabilized LE's with cues for full hip lifting and to slow down with movements    Other Supine Knee/Hip Exercises  in hooklying: bil single hip fall outs with PTA hand as target for 10 reps each side actively, progressing to clamshells with bil LE's moving to hand targets x 10 reps actively.                PT Short Term Goals - 06/10/17 1305      PT SHORT TERM GOAL #1   Title  Perform sit to/from stand from wheelchair or mat with +1 mod assist.    Baseline  06/09/16: met today    Status  Achieved      PT SHORT TERM  GOAL #2   Title  Pt will perform sit to stand from wheelchair to RW with max assist for pre-gait skill.    Baseline  06/09/17: met today    Status  Achieved      PT SHORT TERM GOAL #3   Title  Pt will transfer supine to sit with max assist.    Baseline  06/09/17: met today, needs increased time to assist    Status  Achieved      PT SHORT TERM GOAL #4   Title  Pt will sit with RUE support with min to mod assist unsupported on side of mat for at least 3" to demo improved trunk strength and core stabilization.    Baseline  06/09/17: met today     Status  Achieved      PT SHORT TERM GOAL #5   Title  Pt will amb. 15' with appropriate assistive device with max assist +1    Baseline  06/09/17: pt now needs mod/max assist of 2 people for gait using modified 3 muskateer assist and blue rocker AFO to right LE    Status  Partially Met        PT Long Term Goals - 05/11/17 2122      PT LONG TERM GOAL #1   Title  Pt will perform supine rolling R and L with min assist  to indicate improved independence with bed mobility.      Baseline  dependent for all bed mobility per spouse's report - 05-10-17    Time  8    Period  Weeks    Status  New    Target Date  07/09/17      PT LONG TERM GOAL #2   Title  Pt will sit on side of mat with RUE support for at least 5" with SBA     Baseline  max assist to min assist - ability  varies    Time  8    Period  Weeks    Status  New    Target Date  07/09/17      PT LONG TERM GOAL #3   Title  Pt will amb. 20' with appropriate assistive device with +1 max assist.    Baseline  +2 max - total assist- pt unable to advance LE's - feet not clearing due to pt not wearing AFO's - 05-10-17    Time  8    Period  Weeks    Status  New    Target Date  07/09/17      PT LONG TERM GOAL #4   Title  Pt will perform basic transfers with min to mod assist to indicate improved functional independence.      Baseline  max assist - 05-10-17    Time  8    Period  Weeks    Target Date  07/09/17      PT LONG TERM GOAL #5   Title  Pt will perform static standing x 3" with bil. UE support with mod assist.      Time  8    Period  Weeks    Status  New    Target Date  07/09/17      Additional Long Term Goals   Additional Long Term Goals  Yes      PT LONG TERM GOAL #6   Title  Pt will be able to return to participation in therapeutic horseback riding at [facility] for ongoing exercise program.    Time  8    Period  Weeks    Status  New    Target Date  07/09/17          Plan - 06/27/17 2249    Clinical Impression Statement  Today's skilled session continued to focus on LE strengthening, overall stretching and gait with bil braces/heel wedge in right shoe. Pt with improved ability to self advance bil LE with muscle/activity initiation noted with sit<>stands and gait. Pt is progressing toward goals and should benefit from continued PT to progress toward unmet goals.                                  Rehab Potential  Fair    Clinical Impairments Affecting Rehab Potential  severity of deficits with previous extensive therapies    PT Frequency  2x / week    PT Duration  8 weeks    PT Treatment/Interventions  ADLs/Self Care Home Management;Therapeutic exercise;Therapeutic activities;Functional mobility training;Gait training;Balance training;Neuromuscular re-education;Patient/family  education;Orthotic Fit/Training;Passive range of motion    PT Next Visit Plan  cont LE stretching, gait training,  activities to facilitate trunk flexion    PT Home Exercise Plan  caregiver and husband have previously been instructed in ROM, sitting balance, and transfer training    Consulted and Agree with Plan of Care  Patient       Patient will benefit from skilled therapeutic intervention in order to improve the following deficits and impairments:  Abnormal gait, Decreased activity tolerance, Decreased balance, Decreased cognition, Decreased coordination, Decreased mobility, Decreased range of motion, Decreased endurance, Hypomobility, Decreased strength, Impaired flexibility, Increased muscle spasms, Impaired UE functional use, Postural dysfunction, Pain  Visit Diagnosis: Other abnormalities of gait and mobility  Abnormal posture  Unsteadiness on feet  Muscle weakness (generalized)  Other symptoms and signs involving the nervous system     Problem List Patient Active Problem List   Diagnosis Date Noted  . Coagulase negative Staphylococcus bacteremia 02/09/2017  . Abdominal distension   . Chronic mucus hypersecretion, respiratory 12/31/2016  . Severe protein-calorie malnutrition [name]: less than 60% of standard weight) ([place]) 12/31/2016  . Acute urinary retention 12/31/2016  . Constipation 12/31/2016  . Shortness of breath   . Respiratory distress   . Pneumonia 12/05/2016  . Acute respiratory failure ([facility]) 12/05/2016  . Pseudomonas urinary tract infection 10/27/2016  . Severe muscle deconditioning 09/30/2016  . Pressure injury of skin 09/28/2016  . Respiratory failure ([place])   . Tracheobronchitis   . Urinary retention   . PEG (percutaneous endoscopic gastrostomy) status ([place])   . Benign essential HTN   . Hyponatremia   . Hypokalemia   . Reactive hypertension   . Respiratory insufficiency 09/24/2016  . Malignant neoplasm of upper-inner quadrant of left breast in  female, estrogen receptor positive ([place]) 07/14/2016  . Heterotopic ossification of bone 03/10/2016  . Acute on chronic respiratory failure with hypoxia ([place])   . Acute hypoxemic respiratory failure ([facility])   . Acute blood loss anemia   . Atelectasis 02/04/2016  . Debility 02/03/2016  . Pneumonia of both lower lobes due to Pseudomonas species ([place])   . Pneumonia of both lower lobes due to methicillin susceptible Staphylococcus aureus (MSSA) ([place])   . Acute respiratory failure with hypoxia ([place])   . Acute encephalopathy   . Seizures ([place])   . Sepsis ([place])   . Hypoxia   . Pain   . Chronic respiratory failure ([place] [facility]) 12/28/2015  .  Acute tracheobronchitis 12/24/2015  . Tracheostomy tube present ([place])   . Tracheal stenosis   . Chronic respiratory failure with hypoxia ([place])   . Spastic tetraplegia ([place]) 10/20/2015  . Tracheostomy status ([place]) 09/26/2015  . Allergic rhinitis 09/26/2015  . 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 Movement disorder 09/22/2015  . Encephalitis 09/22/2015  . Chest pain 02/07/2014  . Premature atrial contractions 01/13/2014  . Abnormality of gait 12/04/2013  . Hyperlipidemia 12/21/2011  . Cardiovascular risk factor 12/21/2011  . Bunion 01/31/2008  . Metatarsalgia of both feet 09/20/2007  . FLAT FOOT 09/20/2007  . HALLUX RIGIDUS, ACQUIRED 09/20/2007    Willow Ora, PTA, [facility] [address], [place], [place] [address] [phone] 06/28/17, 11:13 PM   Name: Manal Kreutzer MRN: [id number] Date of Birth: [birth year]

## 2017-06-29 ENCOUNTER — Encounter: Payer: Self-pay | Admitting: Physical Therapy

## 2017-06-29 ENCOUNTER — Encounter: Payer: Self-pay | Admitting: Occupational Therapy

## 2017-06-29 ENCOUNTER — Ambulatory Visit: Payer: Medicare Other

## 2017-06-29 ENCOUNTER — Ambulatory Visit: Payer: Medicare Other | Admitting: Occupational Therapy

## 2017-06-29 ENCOUNTER — Ambulatory Visit: Payer: Medicare Other | Admitting: Physical Therapy

## 2017-06-29 DIAGNOSIS — R293 Abnormal posture: Secondary | ICD-10-CM

## 2017-06-29 DIAGNOSIS — M25622 Stiffness of left elbow, not elsewhere classified: Secondary | ICD-10-CM

## 2017-06-29 DIAGNOSIS — R4701 Aphasia: Secondary | ICD-10-CM

## 2017-06-29 DIAGNOSIS — R1312 Dysphagia, oropharyngeal phase: Secondary | ICD-10-CM

## 2017-06-29 DIAGNOSIS — R2689 Other abnormalities of gait and mobility: Secondary | ICD-10-CM

## 2017-06-29 DIAGNOSIS — R2681 Unsteadiness on feet: Secondary | ICD-10-CM

## 2017-06-29 DIAGNOSIS — M25611 Stiffness of right shoulder, not elsewhere classified: Secondary | ICD-10-CM

## 2017-06-29 DIAGNOSIS — M6281 Muscle weakness (generalized): Secondary | ICD-10-CM

## 2017-06-29 DIAGNOSIS — R471 Dysarthria and anarthria: Secondary | ICD-10-CM | POA: Diagnosis not present

## 2017-06-29 DIAGNOSIS — R29818 Other symptoms and signs involving the nervous system: Secondary | ICD-10-CM

## 2017-06-29 DIAGNOSIS — M25612 Stiffness of left shoulder, not elsewhere classified: Secondary | ICD-10-CM

## 2017-06-29 DIAGNOSIS — R41844 Frontal lobe and executive function deficit: Secondary | ICD-10-CM

## 2017-06-29 DIAGNOSIS — R41841 Cognitive communication deficit: Secondary | ICD-10-CM

## 2017-06-29 NOTE — Therapy (Signed)
[facility] [address] [place], [place], [address] Phone: [phone]   Fax:  [phone]  Occupational Therapy Treatment  Patient Details  Name: Judy Spears MRN: [id number] Date of Birth: 11-May-1950 Referring Provider: Dr. Levada Schilling   Encounter Date: 06/29/2017  OT End of Session - 06/29/17 1703    Visit Number  14    Number of Visits  16    Date for OT Re-Evaluation  07/05/17    Authorization Type  medicare     Authorization Time Period  90 days    OT Start Time  1445    OT Stop Time  1525    OT Time Calculation (min)  40 min    Activity Tolerance  Patient tolerated treatment well    Behavior During Therapy  [facility] for tasks assessed/performed       Past Medical History:  Diagnosis Date  . Arthritis    lt great toe  . Complication of anesthesia    pt has had headaches post op-did advise to hydra well preop  . Hair loss    right-sided  . History of exercise stress test    a. ETT (12/15) with 12:00 exercise, no ST changes, occasional PACs.   . Hyperlipidemia   . Insomnia   . Migraine headache   . Movement disorder    resltess in left legs  . Postmenopausal   . 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 Premature atrial contractions    a. Holter (12/15) with 8% PACs, no atrial runs or atrial fibrillation.     Past Surgical History:  Procedure Laterality Date  . BREAST BIOPSY  01/01/2011   Procedure: BREAST BIOPSY WITH NEEDLE LOCALIZATION;  Surgeon: Rolm Bookbinder, MD;  Location: [facility];  Service: General;  Laterality: Left;  left breast wire localization biopsy  . BREAST BIOPSY Right 07/27/2012  . BREAST BIOPSY Right 07/07/2016  . BREAST BIOPSY Left 06/30/2016  . cataracts right eye    . CESAREAN SECTION  1986  . HERNIA REPAIR  2000   RIH  . INTRAMEDULLARY (IM) NAIL INTERTROCHANTERIC Left 01/30/2017   Procedure: ORIF AFFIXUS NAIL;  Surgeon: Paralee Cancel, MD;  Location: [facility];   Service: Orthopedics;  Laterality: Left;  . IR REPLC GASTRO/COLONIC TUBE PERCUT W/FLUORO  09/29/2016  . opirectinal membrane peel    . PEG PLACEMENT    . RHINOPLASTY  1983  . TRACHEOSTOMY    . TRACHEOSTOMY TUBE PLACEMENT N/A 12/15/2016   Procedure: TRACHEOSTOMY CHANGE;  Surgeon: [name] Montane, MD;  Location: [facility];  Service: ENT;  Laterality: N/A;    There were no vitals filed for this visit.  Subjective Assessment - 06/29/17 1530    Subjective   Patient smiled broadly when shown pictures of groundbreaking ceremony which honored her.      Patient is accompained by:  Family member    Pertinent History  see epic snapshot - ABI/encephalitis/hypoxia from virus; recent hospitalization due to MRSA PNA     Patient Stated Goals  Pt unable to state.     Currently in Pain?  No/denies    Pain Score  0-No pain                   OT Treatments/Exercises (OP) - 06/29/17 0001      Neurological Re-education Exercises   Other Exercises 1  Neuromuscular reeducation to address postural control for sitting, for sit to stand, for standing and for stepping.  Patient was  able to use visual input and tactile cueing to help come toward midline in standing.  When asked open ended question - patient able to indicate direction head or hip / trunk needed to shift to be more aligned - patient could initiate this movement with support, and needed facilitation to complete with her availale range of motion.      Other Exercises 2  Patient had Bilateral AFO's to aide with stepping - patient with significant LE edema - pitting when AFO's removed - gfamily / caregiver encouraged to monitor LE swelling.                 OT Short Term Goals - 06/27/17 1701      OT SHORT TERM GOAL #1   Title  Pt will be mod a for sit to stand from toilet in steady to reduce care giver burden. - 06/07/2017    Status  On-going      OT SHORT TERM GOAL #2   Title  Pt will sit on level surface with 1 UE support only in  preparation for participation in basic self care tasks for at least 3 minutes consistently    Status  Achieved      OT SHORT TERM GOAL #3   Title  Pt will maintain static standing balance with BUE support during ADL activity  with mod a for at least 3 minutes consistently    Status  Achieved      OT SHORT TERM GOAL #4   Title  Pt will roll during LB dressing with max a x1 to reduce caregiver burden.     Status  On-going      OT SHORT TERM GOAL #5   Title  Pt will demonstrate ability to follow simple, functional commands in context at least 50% of the time.     Status  Achieved        OT Long Term Goals - 06/27/17 1701      OT LONG TERM GOAL #1   Title  Pt will go from sit to stand from toilet with steady with min a to reduce caregiver burden - 07/05/2017    Status  On-going      OT LONG TERM GOAL #2   Title  Pt will sit on level surfaces for participation in basic self care tasks with no UE support for at least 5 minutes    Status  On-going      OT LONG TERM GOAL #3   Title  Pt will maintain static standing balance with BUE support duirng ADL activity with min a for at least 3 minutes    Status  On-going      OT LONG TERM GOAL #4   Title  Pt wil roll during LB dressing with mod a x1 to reduce caregiver burden.      Status  On-going            Plan - 06/29/17 1703    Clinical Impression Statement  Patient has arousal level that waxes and wanes however she seems to be the most consistently motivated to stand and walk.      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    Occupational performance deficits (Please refer to evaluation for details):  ADL's;IADL's;Rest and Sleep;Leisure;Social Participation;Work    Physicist, medical  Current  Impairments/barriers affecting progress:  severity of deficits, very slow rate of recovery; pt with recent significant decline over past several months due medical complications    OT Frequency  2x / week    OT Duration  8 weeks    OT Treatment/Interventions  Self-care/ADL training;Ultrasound;Moist Heat;Electrical Stimulation;DME and/or AE instruction;Neuromuscular education;Therapeutic exercise;Functional Mobility Training;Manual Therapy;Passive range of motion;Splinting;Therapeutic activities;Balance training;Patient/family education;Visual/perceptual remediation/compensation;Cognitive remediation/compensation    Plan   NMR trunk and LE's for transitional movements, static standing, sitting balance, sit to stand, stand to sit, stepping    Consulted and Agree with Plan of Care  Patient;Family member/caregiver    Family Member Consulted  husband [name] and caregiver Leesa       Patient will benefit from skilled therapeutic intervention in order to improve the following deficits and impairments:  Decreased endurance, Decreased skin integrity, Impaired vision/preception, Improper body mechanics, Impaired perceived functional ability, Decreased knowledge of precautions, Cardiopulmonary status limiting activity, Decreased activity tolerance, Decreased knowledge of use of DME, Decreased strength, Impaired flexibility, Improper spinal/pelvic alignment, Impaired sensation, Difficulty walking, Decreased mobility, Decreased balance, Decreased cognition, Decreased range of motion, Impaired tone, Impaired UE functional use, Decreased safety awareness, Decreased coordination, Pain, Abnormal gait  Visit Diagnosis: Abnormal posture  Unsteadiness on feet  Muscle weakness (generalized)  Other symptoms and signs involving the nervous system  Frontal lobe and executive function deficit  Stiffness of right shoulder, not elsewhere classified  Stiffness of left shoulder, not elsewhere classified  Stiffness of  left elbow, not elsewhere classified    Problem List Patient Active Problem List   Diagnosis Date Noted  . Coagulase negative Staphylococcus bacteremia 02/09/2017  . Abdominal distension   . Chronic mucus hypersecretion, respiratory 12/31/2016  . Severe protein-calorie malnutrition [name]: less than 60% of standard weight) ([place]) 12/31/2016  . Acute urinary retention 12/31/2016  . Constipation 12/31/2016  . Shortness of breath   . Respiratory distress   . Pneumonia 12/05/2016  . Acute respiratory failure ([place]) 12/05/2016  . Pseudomonas urinary tract infection 10/27/2016  . Severe muscle deconditioning 09/30/2016  . Pressure injury of skin 09/28/2016  . Respiratory failure ([place])   . Tracheobronchitis   . Urinary retention   . PEG (percutaneous endoscopic gastrostomy) status ([place])   . Benign essential HTN   . Hyponatremia   . Hypokalemia   . Reactive hypertension   . Respiratory insufficiency 09/24/2016  . Malignant neoplasm of upper-inner quadrant of left breast in female, estrogen receptor positive ([place]) 07/14/2016  . Heterotopic ossification of bone 03/10/2016  . Acute on chronic respiratory failure with hypoxia ([place])   . Acute hypoxemic respiratory failure ([place])   . Acute blood loss anemia   . Atelectasis 02/04/2016  . Debility 02/03/2016  . Pneumonia of both lower lobes due to Pseudomonas species ([place])   . Pneumonia of both lower lobes due to methicillin susceptible Staphylococcus aureus (MSSA) ([place])   . Acute respiratory failure with hypoxia ([place])   . Acute encephalopathy   . Seizures ([place])   . Sepsis ([place])   . Hypoxia   . Pain   . Chronic respiratory failure ([place]) 12/28/2015  . Acute tracheobronchitis 12/24/2015  . Tracheostomy tube present ([place])   . Tracheal stenosis   . Chronic respiratory failure with hypoxia ([place])   . Spastic tetraplegia ([place]) 10/20/2015  . Tracheostomy status ([place]) 09/26/2015  . Allergic rhinitis 09/26/2015  . Encephalitis, arthropod-borne  viral 09/22/2015  . Movement disorder 09/22/2015  . Encephalitis 09/22/2015  . Chest pain 02/07/2014  .  Premature atrial contractions 01/13/2014  . Abnormality of gait 12/04/2013  . Hyperlipidemia 12/21/2011  . Cardiovascular risk factor 12/21/2011  . Bunion 01/31/2008  . Metatarsalgia of both feet 09/20/2007  . FLAT FOOT 09/20/2007  . HALLUX RIGIDUS, ACQUIRED 09/20/2007    Mariah Milling , OTR/L 06/29/2017, 5:13 PM  [facility] [address] [place], [place], [address] Phone: [phone]   Fax:  [phone]  Name: Ayaka Andes MRN: [id number] Date of Birth: 1950/07/20

## 2017-06-29 NOTE — Therapy (Signed)
[facility] [address] [place], [place], [address] Phone: [phone]   Fax:  [phone]  Speech Language Pathology Treatment  Patient Details  Name: Judy Spears MRN: [id number] Date of Birth: Mar 10, 1950 Referring Provider: Levada Schilling, MD   Encounter Date: 06/29/2017  End of Session - 06/29/17 1718    Visit Number  13    Number of Visits  17    Date for SLP Re-Evaluation  07/22/17    SLP Start Time  21    SLP Stop Time   1401    SLP Time Calculation (min)  42 min    Activity Tolerance  Patient tolerated treatment well;Patient limited by lethargy       Past Medical History:  Diagnosis Date  . Arthritis    lt great toe  . Complication of anesthesia    pt has had headaches post op-did advise to hydra well preop  . Hair loss    right-sided  . History of exercise stress test    a. ETT (12/15) with 12:00 exercise, no ST changes, occasional PACs.   . Hyperlipidemia   . Insomnia   . Migraine headache   . Movement disorder    resltess in left legs  . Postmenopausal   . 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 Premature atrial contractions    a. Holter (12/15) with 8% PACs, no atrial runs or atrial fibrillation.     Past Surgical History:  Procedure Laterality Date  . BREAST BIOPSY  01/01/2011   Procedure: BREAST BIOPSY WITH NEEDLE LOCALIZATION;  Surgeon: Rolm Bookbinder, MD;  Location: [facility];  Service: General;  Laterality: Left;  left breast wire localization biopsy  . BREAST BIOPSY Right 07/27/2012  . BREAST BIOPSY Right 07/07/2016  . BREAST BIOPSY Left 06/30/2016  . cataracts right eye    . CESAREAN SECTION  1986  . HERNIA REPAIR  2000   RIH  . INTRAMEDULLARY (IM) NAIL INTERTROCHANTERIC Left 01/30/2017   Procedure: ORIF AFFIXUS NAIL;  Surgeon: Paralee Cancel, MD;  Location: [facility];  Service: Orthopedics;  Laterality: Left;  . IR REPLC GASTRO/COLONIC TUBE PERCUT W/FLUORO  09/29/2016   . opirectinal membrane peel    . PEG PLACEMENT    . RHINOPLASTY  1983  . TRACHEOSTOMY    . TRACHEOSTOMY TUBE PLACEMENT N/A 12/15/2016   Procedure: TRACHEOSTOMY CHANGE;  Surgeon: [name] Montane, MD;  Location: [facility];  Service: ENT;  Laterality: N/A;    There were no vitals filed for this visit.  Subjective Assessment - 06/29/17 1514    Subjective  SLP used parallel bars today to keep pt alert/aroused, given previous session.    Patient is accompained by:  Family member husband [name], aide [name]    Currently in Pain?  No/denies none indicated            ADULT SLP TREATMENT - 06/29/17 1516      General Information   Behavior/Cognition  Cooperative;Pleasant mood;Distractible;Requires cueing      Treatment Provided   Treatment provided  Cognitive-Linquistic      Cognitive-Linquistic Treatment   Treatment focused on  Dysarthria    Skilled Treatment  Pt remained aroused for 95% of session. In parallel bars likely due to pt's hunched posture she consistently produced weak and almost inaudible voicing. When pt sat down torso was more upright and pt had greater frequency of audible speech. Pt's frequency of voice and of intelligible utterance were as follows today: IMITATION: Voice; 1-syllable  80% (4/5), 2-syllable- 64% (7/11), 4 syllable 0% (0/4), 4+ syllable 100% (1/1). Intelligibility; 1-syllable 60% (3/5), 2-syllable 55% (6/11), 4 syllable 0% (0/3), 4+ syllable 100% (1/1). SPONTANEOUS: Voice; 1-syllable 7/9 (78%), 2-syllable 65% (11/17), 3 syllable 75% (3/4), 4-syllable 14% (1/7), 4+ syllable 100% (1/1). Intelligible: 1-syllable 78% (7/9), 2-syllable 47% (8/17), 50% (2/4), 4 syllable 14% (1/7), 4+ syllable 0% (0/1).      Assessment / Recommendations / Plan   Plan  Continue with current plan of care      Progression Toward Goals   Progression toward goals  Progressing toward goals         SLP Short Term Goals - 06/27/17 1750      SLP SHORT TERM GOAL #1   Title  Pt will  demonstrate dysarthria HEP to increase articulatory coordination for speech with usual mod A over 3 sessions    Status  Deferred      SLP SHORT TERM GOAL #2   Title  Pt will achieve phonation adequate for SLP intelligibility in imitated two-syllable words using intelligibility compensations 12/20 trials with usual min A in 3 sessions    Status  Not Met      SLP SHORT TERM GOAL #3   Title  Pt will imitate 2-syllable words or phrases with functional intelligibility for SLP in 5/15 trials over 3 sessions     Status  Not Met      SLP SHORT TERM GOAL #4   Title  Pt will sustain attention to therapy tasks for 10 minutes with multimodal cues in 5 sessions    Status  Partially Met      SLP SHORT TERM GOAL #5   Title  pt family will demo understanding of oral care regimen, including safe ingestion of water/ice chips over three sessions    Status  Achieved      SLP SHORT TERM GOAL #6   Title  pt will demo dysphagia HEP to improve management of secretions and safety with any possible POs    Status  Deferred      SLP SHORT TERM GOAL #7   Title  family/caregivers will demo understanding of pt swallowing precautions, for pt safety with POs, by verbal report     Status  Achieved       SLP Long Term Goals - 06/29/17 1720      SLP LONG TERM GOAL #1   Title  Pt will demo dysarthria  HEP to incr articulatory coordination for speech, with occasional mod A over three sessions    Time  3    Period  Weeks    Status  On-going      SLP LONG TERM GOAL #2   Title  pt will achieve phonation adequate for SLP intelligibility in imitated 3-syllable words/3-word phrases using intelligibility compenastions 15/20 trials    Time  3    Period  Weeks    Status  On-going      SLP LONG TERM GOAL #3   Title  Pt will imitate 2-syllable words or phrases with functional intelligibility for SLP in 8/15 trials over three sessions    Time  3    Period  Weeks    Status  On-going      SLP LONG TERM GOAL #4   Title   pt will sustain attention to therapy tasks for 10 minutes with multimodal cues in 10 sessions    Baseline  06-10-17    Time  3    Period  Weeks    Status  On-going      SLP LONG TERM GOAL #5   Title  pt will demo dysphagia HEP to improve management of secretions and safety with possible POs    Period  Weeks    Status  Deferred       Plan - 06/29/17 1719    Clinical Impression Statement  Patient presents with ongoing deficits in speech intelligibility (dysarthria, reduced breath support), cognitive communication and swallowing. See skilled treatment for details of voicing, functional speech intelligilibty for imitation and spontaneous production of 1-4+ syllable words/phrases. Recommend continued direct skilled ST to address pt's decline in communication skills, and to monitor a HEP for dysphagia and dysarthria in order to maximize functional communication, secretion management/ swallowing safety, reduce caregiver burden and improve quality of life.    Speech Therapy Frequency  2x / week    Treatment/Interventions  Aspiration precaution training;Trials of upgraded texture/liquids;Compensatory strategies;Functional tasks;Patient/family education;Cueing hierarchy;Diet toleration management by SLP;Cognitive reorganization;Compensatory techniques;SLP instruction and feedback;Internal/external aids;Multimodal communcation approach;Pharyngeal strengthening exercises;Oral motor exercises    Potential to Achieve Goals  Fair    Potential Considerations  Ability to learn/carryover information;Co-morbidities;Severity of impairments;Cooperation/participation level;Previous level of function    Consulted and Agree with Plan of Care  Patient;Family member/caregiver    Family Member Consulted  Husband, [name]       Patient will benefit from skilled therapeutic intervention in order to improve the following deficits and impairments:   Dysarthria and anarthria  Cognitive communication  deficit  Aphasia  Dysphagia, oropharyngeal phase    Problem List Patient Active Problem List   Diagnosis Date Noted  . Coagulase negative Staphylococcus bacteremia 02/09/2017  . Abdominal distension   . Chronic mucus hypersecretion, respiratory 12/31/2016  . Severe protein-calorie malnutrition [name]: less than 60% of standard weight) ([place]) 12/31/2016  . Acute urinary retention 12/31/2016  . Constipation 12/31/2016  . Shortness of breath   . Respiratory distress   . Pneumonia 12/05/2016  . Acute respiratory failure ([place]) 12/05/2016  . Pseudomonas urinary tract infection 10/27/2016  . Severe muscle deconditioning 09/30/2016  . Pressure injury of skin 09/28/2016  . Respiratory failure ([place])   . Tracheobronchitis   . Urinary retention   . PEG (percutaneous endoscopic gastrostomy) status ([place])   . Benign essential HTN   . Hyponatremia   . Hypokalemia   . Reactive hypertension   . Respiratory insufficiency 09/24/2016  . Malignant neoplasm of upper-inner quadrant of left breast in female, estrogen receptor positive ([facility]) 07/14/2016  . Heterotopic ossification of bone 03/10/2016  . Acute on chronic respiratory failure with hypoxia ([place])   . Acute hypoxemic respiratory failure ([place])   . Acute blood loss anemia   . Atelectasis 02/04/2016  . Debility 02/03/2016  . Pneumonia of both lower lobes due to Pseudomonas species ([place] [facility])   . Pneumonia of both lower lobes due to methicillin susceptible Staphylococcus aureus (MSSA) ([place])   . Acute respiratory failure with hypoxia ([place])   . Acute encephalopathy   . Seizures ([place])   . Sepsis ([place])   . Hypoxia   . Pain   . Chronic respiratory failure ([facility]) 12/28/2015  . Acute tracheobronchitis 12/24/2015  . Tracheostomy tube present ([place])   . Tracheal stenosis   . Chronic respiratory failure with hypoxia ([place])   . Spastic tetraplegia ([place]) 10/20/2015  . Tracheostomy status ([place]) 09/26/2015  . Allergic rhinitis 09/26/2015  . 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 Movement disorder 09/22/2015  . Encephalitis 09/22/2015  . Chest  pain 02/07/2014  . Premature atrial contractions 01/13/2014  . Abnormality of gait 12/04/2013  . Hyperlipidemia 12/21/2011  . Cardiovascular risk factor 12/21/2011  . Bunion 01/31/2008  . Metatarsalgia of both feet 09/20/2007  . FLAT FOOT 09/20/2007  . HALLUX RIGIDUS, ACQUIRED 09/20/2007    [facility] ,[place], CCC-SLP  06/29/2017, 5:21 PM  [facility] [address] [place], [place], [address] Phone: [phone]   Fax:  [phone]   Name: Judy Spears MRN: [id number] Date of Birth: [birth year]

## 2017-07-01 NOTE — Therapy (Signed)
[facility] [address] [place], [place], [address] Phone: [phone]   Fax:  [phone]  Physical Therapy Treatment  Patient Details  Name: Judy Spears MRN: [id number] Date of Birth: 11-24-1950 Referring Provider: Dr. Levada Schilling   Encounter Date: 06/29/2017   06/29/17 1404  PT Visits / Re-Eval  Visit Number 13  Number of Visits 17  Date for PT Re-Evaluation 07/09/17  Authorization  Authorization Type [employer]  Authorization Time Period 05-10-17 - 08-08-17  PT Time Calculation  PT Start Time 1410 (pt in bathroom at start of session)  PT Stop Time 1445  PT Time Calculation (min) 35 min  PT - End of Session  Activity Tolerance Patient tolerated treatment well;No increased pain  Behavior During Therapy WFL for tasks assessed/performed     Past Medical History:  Diagnosis Date  . Arthritis    lt great toe  . Complication of anesthesia    pt has had headaches post op-did advise to hydra well preop  . Hair loss    right-sided  . History of exercise stress test    a. ETT (12/15) with 12:00 exercise, no ST changes, occasional PACs.   . Hyperlipidemia   . Insomnia   . Migraine headache   . Movement disorder    resltess in left legs  . Postmenopausal   . 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 Premature atrial contractions    a. Holter (12/15) with 8% PACs, no atrial runs or atrial fibrillation.     Past Surgical History:  Procedure Laterality Date  . BREAST BIOPSY  01/01/2011   Procedure: BREAST BIOPSY WITH NEEDLE LOCALIZATION;  Surgeon: Rolm Bookbinder, MD;  Location: [facility];  Service: General;  Laterality: Left;  left breast wire localization biopsy  . BREAST BIOPSY Right 07/27/2012  . BREAST BIOPSY Right 07/07/2016  . BREAST BIOPSY Left 06/30/2016  . cataracts right eye    . CESAREAN SECTION  1986  . HERNIA REPAIR  2000   RIH  . INTRAMEDULLARY (IM) NAIL INTERTROCHANTERIC  Left 01/30/2017   Procedure: ORIF AFFIXUS NAIL;  Surgeon: Paralee Cancel, MD;  Location: [name];  Service: Orthopedics;  Laterality: Left;  . IR REPLC GASTRO/COLONIC TUBE PERCUT W/FLUORO  09/29/2016  . opirectinal membrane peel    . PEG PLACEMENT    . RHINOPLASTY  1983  . TRACHEOSTOMY    . TRACHEOSTOMY TUBE PLACEMENT N/A 12/15/2016   Procedure: TRACHEOSTOMY CHANGE;  Surgeon: [name] Montane, MD;  Location: [facility];  Service: ENT;  Laterality: N/A;    There were no vitals filed for this visit.     06/29/17 1404  Symptoms/Limitations  Subjective No new issues. No falls and denies any pain today. Has a busy morning getting [place]   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 No/denies  Pain Score 0      06/29/17 0001  Transfers  Transfers Sit to Stand;Stand to Sit;Stand Pivot Transfers  Sit to Stand 4: Min assist;From elevated surface;With armrests;From bed;From chair/3-in-1  Sit to Stand Details Tactile cues for sequencing;Tactile cues for posture;Tactile cues for weight shifting;Verbal cues for sequencing;Verbal cues for technique;Manual facilitation for weight shifting;Manual facilitation for placement;Manual facilitation for weight bearing  Sit to Stand Details (  indicate cue type and reason) min assist with all stands today with continued cues for anterior weight shifting needed. good initation of bil LE's noted with all stands today as well.   Stand to Sit 4: Min assist;With upper extremity assist;To bed;To chair/3-in-1  Stand to Sit Details (indicate cue type and reason) Tactile cues for sequencing;Tactile cues for weight shifting;Tactile cues for posture;Tactile cues for placement;Verbal cues for sequencing;Verbal cues for technique;Manual facilitation for  weight shifting;Manual facilitation for placement;Manual facilitation for weight bearing  Stand to Sit Details pt now reaches back with her right hand to assist with controlled sitting. continues to need cues/facilitation for hip/trunk flexion with sitting down.                          Stand Pivot Transfers 4: Min assist  Stand Pivot Transfer Details (indicate cue type and reason) from wheelchair to mat table with cues/facilitation on posture and sequencing. pt self advanced bil LE's   Ambulation/Gait  Ambulation/Gait Yes  Ambulation/Gait Assistance 3: Mod assist;2: Max assist (of 2 people)  Ambulation/Gait Assistance Details blue rocker brace to right LE with one heel wedge, toe off brace to left LE and bil simulated toe caps with shoe covers. cues/facilitation needed for more upright posture, weight shifting, and for hip/knee flexion with swing phase. pt able to self advance LE's most steps with min assist needed at times on left LE.   Ambulation Distance (Feet) 32 Feet  Assistive device 2 person hand held assist  Gait Pattern Step-to pattern;Step-through pattern;Decreased stride length;Decreased dorsiflexion - right;Decreased dorsiflexion - left;Decreased weight shift to left;Ataxic;Trunk flexed;Narrow base of support;Poor foot clearance - left  Ambulation Surface Level;Indoor  Knee/Hip Exercises: Stretches  Other Knee/Hip Stretches lower trunk rotation stretch for 30 sec holds x 3 each side with gentle overpressure to opposite shoulder for stabilization and increased stretch  Knee/Hip Exercises: Supine  Heel Slides AAROM;Both;AROM;1 set;10 reps  Heel Slides Limitations cues with up to min assist for controlled movements and to move through increased range of motion slowly  Bridges AROM;Both;1 set;10 reps  Bridges Limitations assist needed to stabilized LE's with cues for full hip lifting and to slow down with movements  Other Supine Knee/Hip Exercises in hooklying: bil single hip fall outs  with PTA hand as target for 10 reps each side actively, progressing to clamshells with bil LE's moving to hand targets x 10 reps actively.          PT Short Term Goals - 06/10/17 1305      PT SHORT TERM GOAL #1   Title  Perform sit to/from stand from wheelchair or mat with +1 mod assist.    Baseline  06/09/16: met today    Status  Achieved      PT SHORT TERM GOAL #2   Title  Pt will perform sit to stand from wheelchair to RW with max assist for pre-gait skill.    Baseline  06/09/17: met today    Status  Achieved      PT SHORT TERM GOAL #3   Title  Pt will transfer supine to sit with max assist.    Baseline  06/09/17: met today, needs increased time to assist    Status  Achieved      PT SHORT TERM GOAL #4   Title  Pt will sit with RUE support with min to mod assist unsupported on side of mat for at least 3" to demo improved trunk strength  and core stabilization.    Baseline  06/09/17: met today     Status  Achieved      PT SHORT TERM GOAL #5   Title  Pt will amb. 15' with appropriate assistive device with max assist +1    Baseline  06/09/17: pt now needs mod/max assist of 2 people for gait using modified 3 muskateer assist and blue rocker AFO to right LE    Status  Partially Met        PT Long Term Goals - 05/11/17 2122      PT LONG TERM GOAL #1   Title  Pt will perform supine rolling R and L with min assist  to indicate improved independence with bed mobility.      Baseline  dependent for all bed mobility per spouse's report - 05-10-17    Time  8    Period  Weeks    Status  New    Target Date  07/09/17      PT LONG TERM GOAL #2   Title  Pt will sit on side of mat with RUE support for at least 5" with SBA     Baseline  max assist to min assist - ability varies    Time  8    Period  Weeks    Status  New    Target Date  07/09/17      PT LONG TERM GOAL #3   Title  Pt will amb. 20' with appropriate assistive device with +1 max assist.    Baseline  +2 max - total assist-  pt unable to advance LE's - feet not clearing due to pt not wearing AFO's - 05-10-17    Time  8    Period  Weeks    Status  New    Target Date  07/09/17      PT LONG TERM GOAL #4   Title  Pt will perform basic transfers with min to mod assist to indicate improved functional independence.      Baseline  max assist - 05-10-17    Time  8    Period  Weeks    Target Date  07/09/17      PT LONG TERM GOAL #5   Title  Pt will perform static standing x 3" with bil. UE support with mod assist.      Time  8    Period  Weeks    Status  New    Target Date  07/09/17      Additional Long Term Goals   Additional Long Term Goals  Yes      PT LONG TERM GOAL #6   Title  Pt will be able to return to participation in therapeutic horseback riding at [place] for ongoing exercise program.    Time  8    Period  Weeks    Status  New    Target Date  07/09/17         06/29/17 1405  Plan  Clinical Impression Statement Today's skilled session continued to address LE strengthening, LE/trunk stretching and gait with 2 person assistance. Pt needed less cues for step length, continues to need cues/facilitation for upright posture and weight shifting onto left LE. in stance. Pt continues to make slow, steady progress toward goals and should benefit from continued PT to progress toward unmet goals.   Pt will benefit from skilled therapeutic intervention in order to improve on the following deficits Abnormal gait;Decreased activity tolerance;Decreased balance;Decreased  cognition;Decreased coordination;Decreased mobility;Decreased range of motion;Decreased endurance;Hypomobility;Decreased strength;Impaired flexibility;Increased muscle spasms;Impaired UE functional use;Postural dysfunction;Pain  Rehab Potential Fair  Clinical Impairments Affecting Rehab Potential severity of deficits with previous extensive therapies  PT Frequency 2x / week  PT Duration 8 weeks  PT Treatment/Interventions ADLs/Self Care Home  Management;Therapeutic exercise;Therapeutic activities;Functional mobility training;Gait training;Balance training;Neuromuscular re-education;Patient/family education;Orthotic Fit/Training;Passive range of motion  PT Next Visit Plan cont LE stretching, gait training,  activities to facilitate trunk flexion  PT Home Exercise Plan caregiver and husband have previously been instructed in ROM, sitting balance, and transfer training  Consulted and Agree with Plan of Care Patient          Patient will benefit from skilled therapeutic intervention in order to improve the following deficits and impairments:  Abnormal gait, Decreased activity tolerance, Decreased balance, Decreased cognition, Decreased coordination, Decreased mobility, Decreased range of motion, Decreased endurance, Hypomobility, Decreased strength, Impaired flexibility, Increased muscle spasms, Impaired UE functional use, Postural dysfunction, Pain  Visit Diagnosis: Abnormal posture  Unsteadiness on feet  Muscle weakness (generalized)  Other abnormalities of gait and mobility     Problem List Patient Active Problem List   Diagnosis Date Noted  . Coagulase negative Staphylococcus bacteremia 02/09/2017  . Abdominal distension   . Chronic mucus hypersecretion, respiratory 12/31/2016  . Severe protein-calorie malnutrition [name]: less than 60% of standard weight) ([place]) 12/31/2016  . Acute urinary retention 12/31/2016  . Constipation 12/31/2016  . Shortness of breath   . Respiratory distress   . Pneumonia 12/05/2016  . Acute respiratory failure ([place]) 12/05/2016  . Pseudomonas urinary tract infection 10/27/2016  . Severe muscle deconditioning 09/30/2016  . Pressure injury of skin 09/28/2016  . Respiratory failure ([place])   . Tracheobronchitis   . Urinary retention   . PEG (percutaneous endoscopic gastrostomy) status ([place])   . Benign essential HTN   . Hyponatremia   . Hypokalemia   . Reactive hypertension   .  Respiratory insufficiency 09/24/2016  . Malignant neoplasm of upper-inner quadrant of left breast in female, estrogen receptor positive ([place]) 07/14/2016  . Heterotopic ossification of bone 03/10/2016  . Acute on chronic respiratory failure with hypoxia ([place])   . Acute hypoxemic respiratory failure ([place])   . Acute blood loss anemia   . Atelectasis 02/04/2016  . Debility 02/03/2016  . Pneumonia of both lower lobes due to Pseudomonas species ([place])   . Pneumonia of both lower lobes due to methicillin susceptible Staphylococcus aureus (MSSA) ([place])   . Acute respiratory failure with hypoxia ([place])   . Acute encephalopathy   . Seizures ([place])   . Sepsis ([place])   . Hypoxia   . Pain   . Chronic respiratory failure ([place]) 12/28/2015  . Acute tracheobronchitis 12/24/2015  . Tracheostomy tube present ([place])   . Tracheal stenosis   . Chronic respiratory failure with hypoxia ([place])   . Spastic tetraplegia ([place]) 10/20/2015  . Tracheostomy status ([place]) 09/26/2015  . Allergic rhinitis 09/26/2015  . 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 Movement disorder 09/22/2015  . Encephalitis 09/22/2015  . Chest pain 02/07/2014  . Premature atrial contractions 01/13/2014  . Abnormality of gait 12/04/2013  . Hyperlipidemia 12/21/2011  . Cardiovascular risk factor 12/21/2011  . Bunion 01/31/2008  . Metatarsalgia of both feet 09/20/2007  . FLAT FOOT 09/20/2007  . HALLUX RIGIDUS, ACQUIRED 09/20/2007    Willow Ora, PTA, [facility] [address], [place], [place] [address] [phone] 07/01/17, 4:23 PM   Name: Judy Spears MRN:  [id number] Date of Birth: 12-May-1950

## 2017-07-04 ENCOUNTER — Ambulatory Visit: Payer: Medicare Other

## 2017-07-04 ENCOUNTER — Ambulatory Visit: Payer: Medicare Other | Admitting: Physical Therapy

## 2017-07-04 ENCOUNTER — Encounter: Payer: Self-pay | Admitting: Occupational Therapy

## 2017-07-04 ENCOUNTER — Ambulatory Visit: Payer: Medicare Other | Admitting: Occupational Therapy

## 2017-07-04 DIAGNOSIS — M25611 Stiffness of right shoulder, not elsewhere classified: Secondary | ICD-10-CM

## 2017-07-04 DIAGNOSIS — G8250 Quadriplegia, unspecified: Secondary | ICD-10-CM

## 2017-07-04 DIAGNOSIS — R2681 Unsteadiness on feet: Secondary | ICD-10-CM

## 2017-07-04 DIAGNOSIS — R2689 Other abnormalities of gait and mobility: Secondary | ICD-10-CM

## 2017-07-04 DIAGNOSIS — R471 Dysarthria and anarthria: Secondary | ICD-10-CM | POA: Diagnosis not present

## 2017-07-04 DIAGNOSIS — M25612 Stiffness of left shoulder, not elsewhere classified: Secondary | ICD-10-CM

## 2017-07-04 DIAGNOSIS — R41844 Frontal lobe and executive function deficit: Secondary | ICD-10-CM

## 2017-07-04 DIAGNOSIS — R29818 Other symptoms and signs involving the nervous system: Secondary | ICD-10-CM

## 2017-07-04 DIAGNOSIS — R293 Abnormal posture: Secondary | ICD-10-CM

## 2017-07-04 DIAGNOSIS — M6281 Muscle weakness (generalized): Secondary | ICD-10-CM

## 2017-07-04 DIAGNOSIS — R1312 Dysphagia, oropharyngeal phase: Secondary | ICD-10-CM

## 2017-07-04 DIAGNOSIS — R4701 Aphasia: Secondary | ICD-10-CM

## 2017-07-04 DIAGNOSIS — R41841 Cognitive communication deficit: Secondary | ICD-10-CM

## 2017-07-04 DIAGNOSIS — M25622 Stiffness of left elbow, not elsewhere classified: Secondary | ICD-10-CM

## 2017-07-04 NOTE — Therapy (Signed)
[facility] [address] [place], [place], [address] Phone: [phone]   Fax:  [phone]  Occupational Therapy Treatment  Patient Details  Name: Judy Spears MRN: [id number] Date of Birth: 05/22/1950 Referring Provider: Dr. Levada Schilling   Encounter Date: 07/04/2017  OT End of Session - 07/04/17 1311    Visit Number  15    Number of Visits  16    Date for OT Re-Evaluation  07/19/17    Authorization Type  medicare     Authorization Time Period  90 days    OT Start Time  1018    OT Stop Time  1100    OT Time Calculation (min)  42 min    Activity Tolerance  Patient tolerated treatment well       Past Medical History:  Diagnosis Date  . Arthritis    lt great toe  . Complication of anesthesia    pt has had headaches post op-did advise to hydra well preop  . Hair loss    right-sided  . History of exercise stress test    a. ETT (12/15) with 12:00 exercise, no ST changes, occasional PACs.   . Hyperlipidemia   . Insomnia   . Migraine headache   . Movement disorder    resltess in left legs  . Postmenopausal   . 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 Premature atrial contractions    a. Holter (12/15) with 8% PACs, no atrial runs or atrial fibrillation.     Past Surgical History:  Procedure Laterality Date  . BREAST BIOPSY  01/01/2011   Procedure: BREAST BIOPSY WITH NEEDLE LOCALIZATION;  Surgeon: Rolm Bookbinder, MD;  Location: [facility];  Service: General;  Laterality: Left;  left breast wire localization biopsy  . BREAST BIOPSY Right 07/27/2012  . BREAST BIOPSY Right 07/07/2016  . BREAST BIOPSY Left 06/30/2016  . cataracts right eye    . CESAREAN SECTION  1986  . HERNIA REPAIR  2000   RIH  . INTRAMEDULLARY (IM) NAIL INTERTROCHANTERIC Left 01/30/2017   Procedure: ORIF AFFIXUS NAIL;  Surgeon: Paralee Cancel, MD;  Location: [facility];  Service: Orthopedics;  Laterality: Left;  . IR REPLC  GASTRO/COLONIC TUBE PERCUT W/FLUORO  09/29/2016  . opirectinal membrane peel    . PEG PLACEMENT    . RHINOPLASTY  1983  . TRACHEOSTOMY    . TRACHEOSTOMY TUBE PLACEMENT N/A 12/15/2016   Procedure: TRACHEOSTOMY CHANGE;  Surgeon: [name] Montane, MD;  Location: [facility];  Service: ENT;  Laterality: N/A;    There were no vitals filed for this visit.  Subjective Assessment - 07/04/17 1022    Subjective   No when asked if she had pain.    Patient is accompained by:  Family member husband Judy Spears and caregiver Judy Spears    Pertinent History  see epic snapshot - ABI/encephalitis/hypoxia from virus; recent hospitalization due to MRSA PNA     Patient Stated Goals  Pt unable to state.     Currently in Pain?  No/denies                   OT Treatments/Exercises (OP) - 07/04/17 0001      Neurological Re-education Exercises   Other Exercises 1  Pt arrived today and demonstrated signficant increase in tone throughout her body.  Pt also more lethargic today however did arouse with activity.  Pt demonstrating severe extension in sitting as well as c/o back pain with initial attempts to reduce  tone in trunk and assist pt in forward and R side leaning.  Neuro facilitation techniques to address forward flexion, rotation, weight shift to R and postural alignment first in sitting and then progressing to standing.  Pt able to participate in active sit to stand, active standing and participate in transfers with skilled neuro facilitation techniques.                 OT Short Term Goals - 07/04/17 1309      OT SHORT TERM GOAL #1   Title  Pt will be mod a for sit to stand from toilet in steady to reduce care giver burden. - 06/07/2017    Status  On-going      OT SHORT TERM GOAL #2   Title  Pt will sit on level surface with 1 UE support only in preparation for participation in basic self care tasks for at least 3 minutes consistently    Status  Achieved      OT SHORT TERM GOAL #3   Title  Pt will  maintain static standing balance with BUE support during ADL activity  with mod a for at least 3 minutes consistently    Status  Achieved      OT SHORT TERM GOAL #4   Title  Pt will roll during LB dressing with max a x1 to reduce caregiver burden.     Status  On-going      OT SHORT TERM GOAL #5   Title  Pt will demonstrate ability to follow simple, functional commands in context at least 50% of the time.     Status  Achieved        OT Long Term Goals - 07/04/17 1310      OT LONG TERM GOAL #1   Title  Pt will go from sit to stand from toilet with steady with min a to reduce caregiver burden - 07/05/2017    Status  On-going      OT LONG TERM GOAL #2   Title  Pt will sit on level surfaces for participation in basic self care tasks with no UE support for at least 5 minutes    Status  On-going      OT LONG TERM GOAL #3   Title  Pt will maintain static standing balance with BUE support duirng ADL activity with min a for at least 3 minutes    Status  On-going      OT LONG TERM GOAL #4   Title  Pt wil roll during LB dressing with mod a x1 to reduce caregiver burden.      Status  On-going            Plan - 07/04/17 1310    Clinical Impression Statement  Pt with severe tone and rigidity today upon arrival as well as lethargic.  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    Occupational performance deficits (Please refer to evaluation for details):  ADL's;IADL's;Rest and Sleep;Leisure;Social Participation;Work    Physicist, medical    Current Impairments/barriers affecting progress:  severity of deficits, very slow rate of recovery; pt with recent significant decline over past several months due medical complications    OT  Frequency  2x / week  OT Duration  8 weeks    OT Treatment/Interventions  Self-care/ADL training;Ultrasound;Moist Heat;Electrical Stimulation;DME and/or AE instruction;Neuromuscular education;Therapeutic exercise;Functional Mobility Training;Manual Therapy;Passive range of motion;Splinting;Therapeutic activities;Balance training;Patient/family education;Visual/perceptual remediation/compensation;Cognitive remediation/compensation    Plan   NMR trunk and LE's for transitional movements, static standing, sitting balance, sit to stand, stand to sit, stepping    Consulted and Agree with Plan of Care  Patient;Family member/caregiver    Family Member Consulted  husband Judy Spears and caregiver Judy Spears       Patient will benefit from skilled therapeutic intervention in order to improve the following deficits and impairments:  Decreased endurance, Decreased skin integrity, Impaired vision/preception, Improper body mechanics, Impaired perceived functional ability, Decreased knowledge of precautions, Cardiopulmonary status limiting activity, Decreased activity tolerance, Decreased knowledge of use of DME, Decreased strength, Impaired flexibility, Improper spinal/pelvic alignment, Impaired sensation, Difficulty walking, Decreased mobility, Decreased balance, Decreased cognition, Decreased range of motion, Impaired tone, Impaired UE functional use, Decreased safety awareness, Decreased coordination, Pain, Abnormal gait  Visit Diagnosis: Abnormal posture  Unsteadiness on feet  Muscle weakness (generalized)  Other abnormalities of gait and mobility  Other symptoms and signs involving the nervous system  Frontal lobe and executive function deficit  Stiffness of right shoulder, not elsewhere classified  Stiffness of left shoulder, not elsewhere classified  Stiffness of left elbow, not elsewhere classified  Spastic tetraplegia (HCC)    Problem List Patient Active Problem List   Diagnosis Date Noted  .  Coagulase negative Staphylococcus bacteremia 02/09/2017  . Abdominal distension   . Chronic mucus hypersecretion, respiratory 12/31/2016  . Severe protein-calorie malnutrition [name]: less than 60% of standard weight) ([place]) 12/31/2016  . Acute urinary retention 12/31/2016  . Constipation 12/31/2016  . Shortness of breath   . Respiratory distress   . Pneumonia 12/05/2016  . Acute respiratory failure ([facility]) 12/05/2016  . Pseudomonas urinary tract infection 10/27/2016  . Severe muscle deconditioning 09/30/2016  . Pressure injury of skin 09/28/2016  . Respiratory failure ([facility])   . Tracheobronchitis   . Urinary retention   . PEG (percutaneous endoscopic gastrostomy) status ([place])   . Benign essential HTN   . Hyponatremia   . Hypokalemia   . Reactive hypertension   . Respiratory insufficiency 09/24/2016  . Malignant neoplasm of upper-inner quadrant of left breast in female, estrogen receptor positive ([facility]) 07/14/2016  . Heterotopic ossification of bone 03/10/2016  . Acute on chronic respiratory failure with hypoxia ([place])   . Acute hypoxemic respiratory failure ([facility])   . Acute blood loss anemia   . Atelectasis 02/04/2016  . Debility 02/03/2016  . Pneumonia of both lower lobes due to Pseudomonas species ([facility])   . Pneumonia of both lower lobes due to methicillin susceptible Staphylococcus aureus (MSSA) ([place])   . Acute respiratory failure with hypoxia ([facility])   . Acute encephalopathy   . Seizures ([facility])   . Sepsis ([place])   . Hypoxia   . Pain   . Chronic respiratory failure ([facility]) 12/28/2015  . Acute tracheobronchitis 12/24/2015  . Tracheostomy tube present ([facility])   . Tracheal stenosis   . Chronic respiratory failure with hypoxia ([place])   . Spastic tetraplegia ([place]) 10/20/2015  . Tracheostomy status ([place] [facility]) 09/26/2015  . Allergic rhinitis 09/26/2015  . 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 Movement disorder 09/22/2015  . Encephalitis 09/22/2015  . Chest pain 02/07/2014  .  Premature atrial contractions 01/13/2014  . Abnormality of gait 12/04/2013  . Hyperlipidemia 12/21/2011  . Cardiovascular risk factor 12/21/2011  . Bunion 01/31/2008  . Metatarsalgia  of both feet 09/20/2007  . FLAT FOOT 09/20/2007  . HALLUX RIGIDUS, ACQUIRED 09/20/2007    Quay Burow, OTR/L 07/04/2017, 1:13 PM  [facility] [address] [place], [place], [address] Phone: [phone]   Fax:  [phone]  Name: Judy Spears MRN: [id number] Date of Birth: [birth year]

## 2017-07-04 NOTE — Therapy (Signed)
[facility] [address] [place], [place], [address] Phone: [phone]   Fax:  [phone]  Speech Language Pathology Treatment  Patient Details  Name: Judy Spears MRN: [id number] Date of Birth: 1950/03/22 Referring Provider: Levada Schilling, MD   Encounter Date: 07/04/2017  End of Session - 07/04/17 1610    Visit Number  14    Number of Visits  17    Date for SLP Re-Evaluation  07/22/17    SLP Start Time  1150    SLP Stop Time   1230    SLP Time Calculation (min)  40 min    Activity Tolerance  Patient tolerated treatment well;Patient limited by lethargy       Past Medical History:  Diagnosis Date  . Arthritis    lt great toe  . Complication of anesthesia    pt has had headaches post op-did advise to hydra well preop  . Hair loss    right-sided  . History of exercise stress test    a. ETT (12/15) with 12:00 exercise, no ST changes, occasional PACs.   . Hyperlipidemia   . Insomnia   . Migraine headache   . Movement disorder    resltess in left legs  . Postmenopausal   . 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 Premature atrial contractions    a. Holter (12/15) with 8% PACs, no atrial runs or atrial fibrillation.     Past Surgical History:  Procedure Laterality Date  . BREAST BIOPSY  01/01/2011   Procedure: BREAST BIOPSY WITH NEEDLE LOCALIZATION;  Surgeon: Rolm Bookbinder, MD;  Location: [facility];  Service: General;  Laterality: Left;  left breast wire localization biopsy  . BREAST BIOPSY Right 07/27/2012  . BREAST BIOPSY Right 07/07/2016  . BREAST BIOPSY Left 06/30/2016  . cataracts right eye    . CESAREAN SECTION  1986  . HERNIA REPAIR  2000   RIH  . INTRAMEDULLARY (IM) NAIL INTERTROCHANTERIC Left 01/30/2017   Procedure: ORIF AFFIXUS NAIL;  Surgeon: Paralee Cancel, MD;  Location: [facility];  Service: Orthopedics;  Laterality: Left;  . IR REPLC GASTRO/COLONIC TUBE PERCUT W/FLUORO  09/29/2016   . opirectinal membrane peel    . PEG PLACEMENT    . RHINOPLASTY  1983  . TRACHEOSTOMY    . TRACHEOSTOMY TUBE PLACEMENT N/A 12/15/2016   Procedure: TRACHEOSTOMY CHANGE;  Surgeon: [name] Montane, MD;  Location: [facility];  Service: ENT;  Laterality: N/A;    There were no vitals filed for this visit.  Subjective Assessment - 07/04/17 1543    Subjective  SLP used parallel bars today to keep pt alert/aroused. PT reports pt very somnolent today.    Patient is accompained by:  -- husband, Judy Spears and caregiver Judy Spears    Currently in Pain?  No/denies            ADULT SLP TREATMENT - 07/04/17 1544      General Information   Behavior/Cognition  Lethargic;Alert;Pleasant mood;Distractible;Requires cueing more lethargic last 15 minutes of session      Treatment Provided   Treatment provided  Cognitive-Linquistic      Cognitive-Linquistic Treatment   Treatment focused on  Dysarthria    Skilled Treatment  Pt remained aroused for approx 85% of session. When pt sat down torso was more upright and pt had greater frequency of audible speech, however audible speech was slightly improved in standing position over previous session. Pt's frequency of voice and of intelligible utterance were as follows  today: IMITATION: Voice at functional level; 1-syllable 100% (2/2), 2-syllable- 33% (2/6), 4+ syllable 33%% (1/3). Intelligibility; 1-syllable 100% (2/2), 2-syllable 33% (2/6), 4+ syllable 0% (0/3). SPONTANEOUS: Voice at functional level; 1-syllable 10/12 (83%), 2-syllable 60% (9/15), 3 syllable 63% (5/8), 4+ syllable 67% (2/3). Functionally Intelligible: 1-syllable 75% (12/15), 2-syllable 40% (6/15), 3 syllable 50% (4/8), 4+ syllable 33% (2/3).      Assessment / Recommendations / Plan   Plan  Continue with current plan of care discuss pt status with husband-suggest different venue/ d/c      Progression Toward Goals   Progression toward goals  Not progressing toward goals (comment)         SLP Short Term  Goals - 06/27/17 1750      SLP SHORT TERM GOAL #1   Title  Pt will demonstrate dysarthria HEP to increase articulatory coordination for speech with usual mod A over 3 sessions    Status  Deferred      SLP SHORT TERM GOAL #2   Title  Pt will achieve phonation adequate for SLP intelligibility in imitated two-syllable words using intelligibility compensations 12/20 trials with usual min A in 3 sessions    Status  Not Met      SLP SHORT TERM GOAL #3   Title  Pt will imitate 2-syllable words or phrases with functional intelligibility for SLP in 5/15 trials over 3 sessions     Status  Not Met      SLP SHORT TERM GOAL #4   Title  Pt will sustain attention to therapy tasks for 10 minutes with multimodal cues in 5 sessions    Status  Partially Met      SLP SHORT TERM GOAL #5   Title  pt family will demo understanding of oral care regimen, including safe ingestion of water/ice chips over three sessions    Status  Achieved      SLP SHORT TERM GOAL #6   Title  pt will demo dysphagia HEP to improve management of secretions and safety with any possible POs    Status  Deferred      SLP SHORT TERM GOAL #7   Title  family/caregivers will demo understanding of pt swallowing precautions, for pt safety with POs, by verbal report     Status  Achieved       SLP Long Term Goals - 07/04/17 1612      SLP LONG TERM GOAL #1   Title  Pt will demo dysarthria  HEP to incr articulatory coordination for speech, with occasional mod A over three sessions    Time  2    Period  Weeks    Status  On-going      SLP LONG TERM GOAL #2   Title  pt will achieve phonation adequate for SLP intelligibility in imitated 3-syllable words/3-word phrases using intelligibility compenastions 15/20 trials    Time  2    Period  Weeks    Status  On-going      SLP LONG TERM GOAL #3   Title  Pt will imitate 2-syllable words or phrases with functional intelligibility for SLP in 8/15 trials over three sessions    Baseline   07-04-17    Time  2    Period  Weeks    Status  On-going      SLP LONG TERM GOAL #4   Title  pt will sustain attention to therapy tasks for 10 minutes with multimodal cues in 10 sessions  Baseline  06-10-17, 07-04-17    Time  2    Period  Weeks    Status  On-going      SLP LONG TERM GOAL #5   Title  pt will demo dysphagia HEP to improve management of secretions and safety with possible POs    Period  Weeks    Status  Deferred       Plan - 07/04/17 1610    Clinical Impression Statement  Patient presents with ongoing deficits in speech intelligibility (dysarthria, reduced breath support), cognitive communication and swallowing. See skilled treatment for details of voicing, functional speech intelligilibty for imitation and spontaneous production of 1-4+ syllable words/phrases. Recommend continued direct skilled ST to address pt's decline in communication skills, and to monitor a HEP for dysphagia and dysarthria in order to maximize functional communication, secretion management/ swallowing safety, reduce caregiver burden and improve quality of life. SLP to discuss options for pt's care at this point as pt progress has been less than expected.    Speech Therapy Frequency  2x / week    Duration  -- 8 weeks or 17 visits    Treatment/Interventions  Aspiration precaution training;Trials of upgraded texture/liquids;Compensatory strategies;Functional tasks;Patient/family education;Cueing hierarchy;Diet toleration management by SLP;Cognitive reorganization;Compensatory techniques;SLP instruction and feedback;Internal/external aids;Multimodal communcation approach;Pharyngeal strengthening exercises;Oral motor exercises    Potential to Achieve Goals  Fair    Potential Considerations  Ability to learn/carryover information;Co-morbidities;Severity of impairments;Cooperation/participation level;Previous level of function    Consulted and Agree with Plan of Care  Patient;Family member/caregiver    Family  Member Consulted  Husband, Judy Spears       Patient will benefit from skilled therapeutic intervention in order to improve the following deficits and impairments:   Dysarthria and anarthria  Aphasia  Cognitive communication deficit  Dysphagia, oropharyngeal phase    Problem List Patient Active Problem List   Diagnosis Date Noted  . Coagulase negative Staphylococcus bacteremia 02/09/2017  . Abdominal distension   . Chronic mucus hypersecretion, respiratory 12/31/2016  . Severe protein-calorie malnutrition [name]: less than 60% of standard weight) ([place]) 12/31/2016  . Acute urinary retention 12/31/2016  . Constipation 12/31/2016  . Shortness of breath   . Respiratory distress   . Pneumonia 12/05/2016  . Acute respiratory failure ([place]) 12/05/2016  . Pseudomonas urinary tract infection 10/27/2016  . Severe muscle deconditioning 09/30/2016  . Pressure injury of skin 09/28/2016  . Respiratory failure ([place])   . Tracheobronchitis   . Urinary retention   . PEG (percutaneous endoscopic gastrostomy) status ([place])   . Benign essential HTN   . Hyponatremia   . Hypokalemia   . Reactive hypertension   . Respiratory insufficiency 09/24/2016  . Malignant neoplasm of upper-inner quadrant of left breast in female, estrogen receptor positive ([place]) 07/14/2016  . Heterotopic ossification of bone 03/10/2016  . Acute on chronic respiratory failure with hypoxia ([place])   . Acute hypoxemic respiratory failure ([place])   . Acute blood loss anemia   . Atelectasis 02/04/2016  . Debility 02/03/2016  . Pneumonia of both lower lobes due to Pseudomonas species ([place])   . Pneumonia of both lower lobes due to methicillin susceptible Staphylococcus aureus (MSSA) ([place])   . Acute respiratory failure with hypoxia ([place])   . Acute encephalopathy   . Seizures ([place])   . Sepsis ([place])   . Hypoxia   . Pain   . Chronic respiratory failure ([place]) 12/28/2015  . Acute tracheobronchitis 12/24/2015  . Tracheostomy tube  present ([place])   . Tracheal stenosis   .  Chronic respiratory failure with hypoxia ([place])   . Spastic tetraplegia ([place]) 10/20/2015  . Tracheostomy status ([place]) 09/26/2015  . Allergic rhinitis 09/26/2015  . 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 Movement disorder 09/22/2015  . Encephalitis 09/22/2015  . Chest pain 02/07/2014  . Premature atrial contractions 01/13/2014  . Abnormality of gait 12/04/2013  . Hyperlipidemia 12/21/2011  . Cardiovascular risk factor 12/21/2011  . Bunion 01/31/2008  . Metatarsalgia of both feet 09/20/2007  . FLAT FOOT 09/20/2007  . HALLUX RIGIDUS, ACQUIRED 09/20/2007    [facility] ,MS, CCC-SLP  07/04/2017, 4:14 PM  [facility] [address] [place], [place], [address] Phone: [phone]   Fax:  [phone]   Name: Judy Spears MRN: [id number] Date of Birth: February 19, 1950

## 2017-07-05 ENCOUNTER — Encounter: Payer: Self-pay | Admitting: Pulmonary Disease

## 2017-07-05 ENCOUNTER — Encounter: Payer: Self-pay | Admitting: Physical Therapy

## 2017-07-05 ENCOUNTER — Ambulatory Visit (INDEPENDENT_AMBULATORY_CARE_PROVIDER_SITE_OTHER)
Admission: RE | Admit: 2017-07-05 | Discharge: 2017-07-05 | Disposition: A | Discharge status: Home / Self Care | Payer: Medicare Other | Source: Ambulatory Visit | Attending: Pulmonary Disease | Admitting: Pulmonary Disease

## 2017-07-05 ENCOUNTER — Ambulatory Visit: Payer: Medicare Other | Admitting: Pulmonary Disease

## 2017-07-05 VITALS — BP 128/80 | HR 87 | Ht 62.0 in | Wt 123.6 lb

## 2017-07-05 DIAGNOSIS — Z930 Tracheostomy status: Secondary | ICD-10-CM | POA: Diagnosis not present

## 2017-07-05 DIAGNOSIS — R059 Cough, unspecified: Secondary | ICD-10-CM

## 2017-07-05 DIAGNOSIS — J209 Acute bronchitis, unspecified: Secondary | ICD-10-CM | POA: Diagnosis not present

## 2017-07-05 DIAGNOSIS — R05 Cough: Secondary | ICD-10-CM

## 2017-07-05 MED ORDER — LEVOFLOXACIN 750 MG PO TABS: 750.0000 mg | ORAL_TABLET | Freq: Every day | ORAL | 0 refills | Status: AC

## 2017-07-05 MED ORDER — SODIUM CHLORIDE 3 % IN NEBU: INHALATION_SOLUTION | Freq: Two times a day (BID) | RESPIRATORY_TRACT | 4 refills | Status: DC | PRN

## 2017-07-05 NOTE — Progress Notes (Signed)
Subjective:    Patient ID: Judy Spears, female    DOB: 02/05/1950, 67 y.o.   MRN: [id number]  Synopsis: In November she 2016 was bitten by a tick and then within 2 weeks had a progressive neurologic illness. Specifically she was found unresponsive by her husband while [place] of [place]. She was brought to local hospital where she required intubation due to unresponsiveness. Tracheostomy was performed on hospital day 2 and extensive workup for her neurologic illness begun. 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 She was then transferred for inpatient rehabilitation to the [facility] in [place]. While there she continued to participate in physical therapy and occupational therapy. However, during this time she was never able to consistently take by mouth and all nutrition has been provided the enteral feeding since around the time of her admission to the intensive care unit.  The tracheostomy has been in place since her original hospitalization in December 2016. It was briefly removed in May of 2017 but unfortunately she had a complication of laryngospasm and it sounds like the tracheostomy was replaced approximately 2 weeks later.  The tracheostomy was removed on 11/12/2015.  Not long afterwards she was hospitalized for healthcare associated pneumonia. This is a prolonged hospitalization complicated by encephalopathy and ultimately she ended up back in rehabilitation once again.  The tracheostomy was removed again in the spring of 2018. Shortly after this decannulation she required hospitalization once again for pneumonia.  Diagnosed with breast cancer and underwent lumpectomy under local anesthesia at [facility] summer 2018.  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  HPI Chief Complaint  Patient  presents with  . Follow-up    Last three days she has had thicker then normal mucus yellowish in color. But had some blood in it this morning.    In general Judy Spears has been doing fairly well over the last several months.  She has been participating in physical rehab twice a week and then in between sessions will do some standing at home.  From a respiratory standpoint things have been stable up until about 3 days ago.  She started having increasing cough and mucus production.  She has been a little bit more lethargic.  She has been outside a bit more recently with some trips around town.  No recent sick contacts that she is aware of.  No fevers no chills.  The mucus is thicker, somewhat purulent and there is a scant amount of blood in it the other day.  She has been using her therapy vest twice a day, hypertonic saline twice a day, albuterol twice a day.  She continues taking Mucinex as well.    Past Medical History:  Diagnosis Date  . Arthritis    lt great toe  . Complication of anesthesia    pt has had headaches post op-did advise to hydra well preop  . Hair loss    right-sided  . History of exercise stress test    a. ETT (12/15) with 12:00 exercise, no ST changes, occasional PACs.   . Hyperlipidemia   . Insomnia   . Migraine headache   . Movement disorder    resltess in left legs  . Postmenopausal   . 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 Premature atrial contractions    a. Holter (12/15) with 8% PACs, no atrial runs or atrial  fibrillation.       Review of Systems  Constitutional: Positive for activity change. Negative for fatigue and fever.  HENT: Negative for sinus pressure, sneezing and sore throat.   Respiratory: Positive for cough. Negative for shortness of breath and wheezing.   Cardiovascular: Negative for chest pain and leg swelling.       Objective:   Physical Exam Vitals:   07/05/17 1042  BP: 128/80  Pulse: 87  SpO2: 97%  Weight: 123 lb 9.6 oz (56.1 kg)  Height: 5\' 2"   (1.575 m)    Gen: chronically ill appearing HENT: OP clear, trach stoma with granulation tissue noted, small focus of granulation at 1 o'clock not obstructing the airway PULM: CTA B, normal percussion CV: RRR, no mgr, trace edema GI: BS+, soft, nontender Derm: no cyanosis or rash MSK: some muscle contractions noted arms   Chest x-ray from May 2019 reviewed with possible infiltrates lower lobes  CBC    Component Value Date/Time   WBC 9.1 02/02/2017 0217   RBC 3.37 (L) 02/02/2017 0217   HGB 9.4 (L) 02/02/2017 0217   HGB 13.1 09/08/2016 1245   HCT 30.7 (L) 02/02/2017 0217   HCT 40.7 09/08/2016 1245   PLT 472 (H) 02/02/2017 0217   PLT 363 09/08/2016 1245   MCV 91.1 02/02/2017 0217   MCV 90 09/08/2016 1245   MCH 27.9 02/02/2017 0217   MCHC 30.6 02/02/2017 0217   RDW 15.9 (H) 02/02/2017 0217   RDW 13.5 09/08/2016 1245   LYMPHSABS 2.0 01/28/2017 1340   MONOABS 0.9 01/28/2017 1340   EOSABS 0.3 01/28/2017 1340   BASOSABS 0.0 01/28/2017 1340   BMET    Component Value Date/Time   NA 132 (L) 02/01/2017 0556   NA 134 09/08/2016 1245   K 4.4 02/01/2017 0556   CL 92 (L) 02/01/2017 0556   CO2 24 02/01/2017 0556   GLUCOSE 112 (H) 02/01/2017 0556   BUN 16 02/01/2017 0556   BUN 10 09/08/2016 1245   CREATININE 0.31 (L) 02/01/2017 0556   CALCIUM 9.0 02/01/2017 0556   GFRNONAA >60 02/01/2017 0556   GFRAA >60 02/01/2017 0556         Assessment & Plan:   Cough - Plan: DG Chest 2 View  Acute tracheobronchitis  Tracheostomy tube present ([place])  Discussion: K is having a mild case of tracheobronchitis right now.  Her lungs are clear which is good and her oxygenation is okay but we know from prior experience that this can lead to pneumonia if not treated aggressively.  Most recently she has done well with Levaquin.  She also has a small tag of granulation tissue at 1:00 in her tracheal stoma.  I advised that if this seems to impede trach changes or if it causes any discomfort  we could refer to Dr. Constance Holster to consider resecting this.  Plan: Recurrent tracheobronchitis, now with acute exacerbation: Continue hypertonic saline twice a day Continue therapy vest twice a day Continue albuterol twice a day Levaquin 750 mg x 6 days Chest x-ray today to evaluate for pneumonia Call us if no improvement after the antibiotic If secretions increase then you can do a third session of mucociliary clearance measures (vest, saline, albuterol)  Tracheostomy status: The small tag of granulation tissue seen today in the tracheal stoma does not appear to be impeding flow nor does it appear malignant or infected.  If this causes discomfort with trach changes or in any way is impeding exchanges then let us know and  we can have Dr. Constance Holster assessed to resect this.  We will plan on seeing you back in 4 months or sooner if needed   Current Outpatient Medications:  .  albuterol (PROVENTIL) (2.5 MG/3ML) 0.083% nebulizer solution, Take 3 mLs (2.5 mg total) by nebulization every 6 (six) hours as needed for wheezing or shortness of breath. (Patient taking differently: Take 2.5 mg every 6 (six) hours as needed by nebulization for wheezing or shortness of breath (congestion). ), Disp: 360 mL, Rfl: 11 .  amantadine (SYMMETREL) 50 MG/5ML solution, PLACE 68mL INTO FEEDING TUBE TWICE DAILY (Patient taking differently: PLACE 15 ML (150MG )  INTO FEEDING TUBE EVERY 12 HOURS -  8AM AND NOON), Disp: 900 mL, Rfl: 0 .  Amino Acids-Protein Hydrolys (FEEDING SUPPLEMENT, PRO-STAT SUGAR FREE 64,) LIQD, Place 30 mLs into feeding tube 2 (two) times daily., Disp: 1800 mL, Rfl: 0 .  aspirin EC 81 MG tablet, Take 81 mg by mouth daily., Disp: , Rfl:  .  atorvastatin (LIPITOR) 20 MG tablet, Place 20 mg into feeding tube at bedtime. , Disp: , Rfl:  .  baclofen (LIORESAL) 10 MG tablet, PLACE 1/2 TABLET (5 MG) INTO FEEDING TUBE FOUR TIMES DAILY, Disp: , Rfl:  .  bromocriptine (PARLODEL) 2.5 MG tablet, Take 2.5-5 mg by mouth  See admin instructions. 2.5mg  daily until 12/31 and then increase to 5mg  daily, Disp: , Rfl:  .  Calcium Carb-Cholecalciferol (CALCIUM CARBONATE-VITAMIN D3 PO), Place 2 tablets into feeding tube daily., Disp: , Rfl:  .  carbidopa-levodopa (SINEMET IR) 25-100 MG tablet, Place 2 tablets into feeding tube 3 (three) times daily., Disp: , Rfl:  .  chlorhexidine (PERIDEX) 0.12 % solution, 33ml IN THE MOUTH OR THROAT TWICE DAILY, Disp: 473 mL, Rfl: 0 .  Cholecalciferol (VITAMIN D) 2000 units CAPS, Place 2,000 Units daily into feeding tube., Disp: , Rfl:  .  Coenzyme Q10 (COQ10 PO), Place 10 mLs into feeding tube daily. 0600, Disp: , Rfl:  .  docusate sodium (COLACE) 100 MG capsule, Take 100 mg by mouth 2 (two) times daily., Disp: , Rfl:  .  ferrous sulfate 325 (65 FE) MG tablet, Take 325 mg by mouth daily with breakfast., Disp: , Rfl:  .  fluticasone (FLONASE) 50 MCG/ACT nasal spray, Place 1 spray into both nostrils daily. , Disp: , Rfl:  .  gabapentin (NEURONTIN) 100 MG capsule, One capsule--open and administer via PEG twice a day. (Patient taking differently: 100 mg See admin instructions. Open one capsule (100 mg) and administer per tube twice daily - 8 am and 5pm), Disp: 60 capsule, Rfl: 0 .  guaiFENesin (ROBITUSSIN) 100 MG/5ML SOLN, Place 10 mLs (200 mg total) into feeding tube daily as needed for cough., Disp: , Rfl:  .  ibuprofen (ADVIL,MOTRIN) 100 MG/5ML suspension, Place 400 mg into feeding tube every 6 (six) hours as needed for mild pain or moderate pain., Disp: , Rfl:  .  ketoconazole (NIZORAL) 2 % cream, Apply 1 application topically daily as needed. , Disp: , Rfl:  .  loratadine (CLARITIN) 10 MG tablet, Place 1 tablet (10 mg total) into feeding tube daily., Disp: , Rfl:  .  Melatonin 5 MG CAPS, Place 5 mg at bedtime into feeding tube. , Disp: , Rfl:  .  Multiple Vitamin (MULTIVITAMIN) LIQD, Place 15 mLs into feeding tube daily. (Patient taking differently: Place 15 mLs into feeding tube daily.  0600), Disp: 1 Bottle, Rfl: 3 .  Nutritional Supplements (FEEDING SUPPLEMENT, OSMOLITE 1.5 CAL,) LIQD, Give nightly  continuous feed for 13 hours at 18ml/hour. Give two bolus feeds of 229ml in between sinemet doses. (Patient taking differently: Give nightly continuous feed for 13 hours at 134ml/hour.), Disp: , Rfl:  .  polyethylene glycol (MIRALAX / GLYCOLAX) packet, Place 17 g into feeding tube daily as needed for mild constipation., Disp: 14 each, Rfl: 0 .  potassium chloride 20 MEQ/15ML (10%) SOLN, Place 15 mLs (20 mEq total) into feeding tube daily. (Patient taking differently: Place 7.5 mEq into feeding tube daily. 7.5 mls into feeding tube M,W,F.), Disp: 900 mL, Rfl: 5 .  primidone (MYSOLINE) 50 MG tablet, Place 1 tablet (50 mg total) into feeding tube 4 (four) times daily., Disp: 120 tablet, Rfl: 1 .  Probiotic Product (PROBIOTIC-10 PO), Take by mouth., Disp: , Rfl:  .  Propylene Glycol (SYSTANE BALANCE) 0.6 % SOLN, Place 1 drop into both eyes daily., Disp: , Rfl:  .  ranitidine (ZANTAC) 75 MG tablet, Take 75 mg by mouth 2 (two) times daily., Disp: , Rfl:  .  sodium chloride HYPERTONIC 3 % nebulizer solution, Take by nebulization 2 (two) times daily as needed for other., Disp: 360 mL, Rfl: 11 .  valACYclovir (VALTREX) 500 MG tablet, Place 500 mg daily into feeding tube. , Disp: , Rfl:  .  vitamin C (ASCORBIC ACID) 500 MG tablet, Place 500 mg daily into feeding tube., Disp: , Rfl:  .  Water For Irrigation, Sterile (FREE WATER) SOLN, Place 200 mLs into feeding tube every 8 (eight) hours. (Patient taking differently: Place 200 mLs 3 (three) times daily into feeding tube. ), Disp: , Rfl:

## 2017-07-05 NOTE — Patient Instructions (Signed)
Recurrent tracheobronchitis, now with acute exacerbation: Continue hypertonic saline twice a day Continue therapy vest twice a day Continue albuterol twice a day Levaquin 750 mg x 6 days Chest x-ray today to evaluate for pneumonia Call us if no improvement after the antibiotic If secretions increase then you can do a third session of mucociliary clearance measures (vest, saline, albuterol)  Tracheostomy status: The small tag of granulation tissue seen today in the tracheal stoma does not appear to be impeding flow nor does it appear malignant or infected.  If this causes discomfort with trach changes or in any way is impeding exchanges then let us know and we can have Dr. Constance Holster assessed to resect this.  We will plan on seeing you back in 4 months or sooner if needed

## 2017-07-05 NOTE — Therapy (Signed)
[facility] [address] [place], [place], [address] Phone: [phone]   Fax:  [phone]  Physical Therapy Treatment  Patient Details  Name: Judy Spears MRN: [id number] Date of Birth: [birth year] Referring Provider: Dr. Levada Schilling   Encounter Date: 07/04/2017  PT End of Session - 07/05/17 1323    Visit Number  14    Number of Visits  17    Date for PT Re-Evaluation  07/09/17    Authorization Type  [employer] Medicare    Authorization Time Period  05-10-17 - 08-08-17    PT Start Time  1103    PT Stop Time  1146    PT Time Calculation (min)  43 min       Past Medical History:  Diagnosis Date  . Arthritis    lt great toe  . Complication of anesthesia    pt has had headaches post op-did advise to hydra well preop  . Hair loss    right-sided  . History of exercise stress test    a. ETT (12/15) with 12:00 exercise, no ST changes, occasional PACs.   . Hyperlipidemia   . Insomnia   . Migraine headache   . Movement disorder    resltess in left legs  . Postmenopausal   . 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 Premature atrial contractions    a. Holter (12/15) with 8% PACs, no atrial runs or atrial fibrillation.     Past Surgical History:  Procedure Laterality Date  . BREAST BIOPSY  01/01/2011   Procedure: BREAST BIOPSY WITH NEEDLE LOCALIZATION;  Surgeon: Rolm Bookbinder, MD;  Location: [facility];  Service: General;  Laterality: Left;  left breast wire localization biopsy  . BREAST BIOPSY Right 07/27/2012  . BREAST BIOPSY Right 07/07/2016  . BREAST BIOPSY Left 06/30/2016  . cataracts right eye    . CESAREAN SECTION  1986  . HERNIA REPAIR  2000   [facility]  . INTRAMEDULLARY (IM) NAIL INTERTROCHANTERIC Left 01/30/2017   Procedure: ORIF AFFIXUS NAIL;  Surgeon: Paralee Cancel, MD;  Location: [facility];  Service: Orthopedics;  Laterality: Left;  . IR REPLC GASTRO/COLONIC TUBE PERCUT W/FLUORO  09/29/2016  .  opirectinal membrane peel    . PEG PLACEMENT    . RHINOPLASTY  1983  . TRACHEOSTOMY    . TRACHEOSTOMY TUBE PLACEMENT N/A 12/15/2016   Procedure: TRACHEOSTOMY CHANGE;  Surgeon: [name] Montane, MD;  Location: [facility];  Service: ENT;  Laterality: N/A;    There were no vitals filed for this visit.  Subjective Assessment - 07/05/17 1052    Subjective  [name] reports pt did not sleep well Saturday night, but did sleep better Sunday night; pt sitting unsupported on side of mat ([name]) in front with assistance prn, as pt had OT prior to PT session; pt has eyes closed    Patient is accompained by:  Family member    Pertinent History  Powassen viral encephalitis; anoxic BI:  ORIF Lt hip 02-09-17;  Rt THR  04-12-17    Patient Stated Goals  improve mobility; walk    Currently in Pain?  No/denies                       [facility] Adult PT Treatment/Exercise - 07/05/17 0001      Transfers   Transfers  Sit to Stand;Stand to Sit    Sit to Stand  3: Mod assist    Sit to Stand Details  Tactile cues for sequencing;Tactile cues for posture;Tactile cues for weight shifting;Verbal cues for sequencing;Verbal cues for technique;Manual facilitation for weight shifting;Manual facilitation for placement;Manual facilitation for weight bearing    Stand to Sit  3: Mod assist    Stand to Sit Details (indicate cue type and reason)  Tactile cues for sequencing;Tactile cues for weight shifting;Tactile cues for posture;Tactile cues for placement;Verbal cues for sequencing;Verbal cues for technique;Manual facilitation for weight shifting;Manual facilitation for placement;Manual facilitation for weight bearing    Stand to Sit Details  cues to reach back with Rt hand and to lean forward      Ambulation/Gait   Ambulation/Gait  Yes    Ambulation/Gait Assistance  1: +2 Total assist husband assisted advancing feet on 2nd rep amb.    Ambulation/Gait Assistance Details  Blue rocker AFO with heel wedge on RLE, Toe off AFO on LLE;  blue shoe covers used on each foot    Ambulation Distance (Feet)  10 Feet 22' 2nd rep with +2 max assist for standing, +1 advancing fe    Assistive device  2 person hand held assist    Gait Pattern  Step-to pattern;Step-through pattern;Decreased stride length;Decreased dorsiflexion - right;Decreased dorsiflexion - left;Decreased weight shift to left;Ataxic;Trunk flexed;Narrow base of support;Poor foot clearance - left    Ambulation Surface  Level;Indoor      Knee/Hip Exercises: Supine   Heel Slides  AAROM;Both;AROM;1 set;10 reps    Heel Slides Limitations  holding for approx. 5 secs at end ROM in flexion (approx. 80 degrees hip flexion bil. LE's) for stretching    Bridges  AROM;Both;1 set;10 reps    Bridges Limitations  min assist to stabilize bil. LE's    Other Supine Knee/Hip Exercises  hip abdct./adduction in hooklying                PT Short Term Goals - 06/10/17 1305      PT SHORT TERM GOAL #1   Title  Perform sit to/from stand from wheelchair or mat with +1 mod assist.    Baseline  06/09/16: met today    Status  Achieved      PT SHORT TERM GOAL #2   Title  Pt will perform sit to stand from wheelchair to RW with max assist for pre-gait skill.    Baseline  06/09/17: met today    Status  Achieved      PT SHORT TERM GOAL #3   Title  Pt will transfer supine to sit with max assist.    Baseline  06/09/17: met today, needs increased time to assist    Status  Achieved      PT SHORT TERM GOAL #4   Title  Pt will sit with RUE support with min to mod assist unsupported on side of mat for at least 3" to demo improved trunk strength and core stabilization.    Baseline  06/09/17: met today     Status  Achieved      PT SHORT TERM GOAL #5   Title  Pt will amb. 15' with appropriate assistive device with max assist +1    Baseline  06/09/17: pt now needs mod/max assist of 2 people for gait using modified 3 muskateer assist and blue rocker AFO to right LE    Status  Partially Met         PT Long Term Goals - 05/11/17 2122      PT LONG TERM GOAL #1   Title  Pt will perform supine rolling  R and L with min assist  to indicate improved independence with bed mobility.      Baseline  dependent for all bed mobility per spouse's report - 05-10-17    Time  8    Period  Weeks    Status  New    Target Date  07/09/17      PT LONG TERM GOAL #2   Title  Pt will sit on side of mat with RUE support for at least 5" with SBA     Baseline  max assist to min assist - ability varies    Time  8    Period  Weeks    Status  New    Target Date  07/09/17      PT LONG TERM GOAL #3   Title  Pt will amb. 20' with appropriate assistive device with +1 max assist.    Baseline  +2 max - total assist- pt unable to advance LE's - feet not clearing due to pt not wearing AFO's - 05-10-17    Time  8    Period  Weeks    Status  New    Target Date  07/09/17      PT LONG TERM GOAL #4   Title  Pt will perform basic transfers with min to mod assist to indicate improved functional independence.      Baseline  max assist - 05-10-17    Time  8    Period  Weeks    Target Date  07/09/17      PT LONG TERM GOAL #5   Title  Pt will perform static standing x 3" with bil. UE support with mod assist.      Time  8    Period  Weeks    Status  New    Target Date  07/09/17      Additional Long Term Goals   Additional Long Term Goals  Yes      PT LONG TERM GOAL #6   Title  Pt will be able to return to participation in therapeutic horseback riding at [place] for ongoing exercise program.    Time  8    Period  Weeks    Status  New    Target Date  07/09/17            Plan - 07/05/17 1324    Clinical Impression Statement  Pt had more difficulty with gait training today compared to performance in session on 06-27-17; pt unable to advance either LE today, with husband pushing each leg forward in swing phase of gait;  pt also very groggy with eyes closed at times:  pt also noted to have increased tone  today    Rehab Potential  Fair    Clinical Impairments Affecting Rehab Potential  severity of deficits with previous extensive therapies    PT Frequency  2x / week    PT Duration  8 weeks    PT Treatment/Interventions  ADLs/Self Care Home Management;Therapeutic exercise;Therapeutic activities;Functional mobility training;Gait training;Balance training;Neuromuscular re-education;Patient/family education;Orthotic Fit/Training;Passive range of motion    PT Next Visit Plan  cont LE stretching, gait training,  activities to facilitate trunk flexion    PT Home Exercise Plan  caregiver and husband have previously been instructed in ROM, sitting balance, and transfer training    Consulted and Agree with Plan of Care  Patient       Patient will benefit from skilled therapeutic intervention in order to improve the following  deficits and impairments:  Abnormal gait, Decreased activity tolerance, Decreased balance, Decreased cognition, Decreased coordination, Decreased mobility, Decreased range of motion, Decreased endurance, Hypomobility, Decreased strength, Impaired flexibility, Increased muscle spasms, Impaired UE functional use, Postural dysfunction, Pain  Visit Diagnosis: Other abnormalities of gait and mobility  Other symptoms and signs involving the nervous system     Problem List Patient Active Problem List   Diagnosis Date Noted  . Coagulase negative Staphylococcus bacteremia 02/09/2017  . Abdominal distension   . Chronic mucus hypersecretion, respiratory 12/31/2016  . Severe protein-calorie malnutrition [name]: less than 60% of standard weight) ([place]) 12/31/2016  . Acute urinary retention 12/31/2016  . Constipation 12/31/2016  . Shortness of breath   . Respiratory distress   . Pneumonia 12/05/2016  . Acute respiratory failure ([place]) 12/05/2016  . Pseudomonas urinary tract infection 10/27/2016  . Severe muscle deconditioning 09/30/2016  . Pressure injury of skin 09/28/2016  .  Respiratory failure ([place])   . Tracheobronchitis   . Urinary retention   . PEG (percutaneous endoscopic gastrostomy) status ([place])   . Benign essential HTN   . Hyponatremia   . Hypokalemia   . Reactive hypertension   . Respiratory insufficiency 09/24/2016  . Malignant neoplasm of upper-inner quadrant of left breast in female, estrogen receptor positive ([place]) 07/14/2016  . Heterotopic ossification of bone 03/10/2016  . Acute on chronic respiratory failure with hypoxia ([place])   . Acute hypoxemic respiratory failure ([place])   . Acute blood loss anemia   . Atelectasis 02/04/2016  . Debility 02/03/2016  . Pneumonia of both lower lobes due to Pseudomonas species ([place])   . Pneumonia of both lower lobes due to methicillin susceptible Staphylococcus aureus (MSSA) ([place])   . Acute respiratory failure with hypoxia ([place])   . Acute encephalopathy   . Seizures ([place])   . Sepsis ([place])   . Hypoxia   . Pain   . Chronic respiratory failure ([place]) 12/28/2015  . Acute tracheobronchitis 12/24/2015  . Tracheostomy tube present ([place])   . Tracheal stenosis   . Chronic respiratory failure with hypoxia ([place])   . 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 Tracheostomy status ([place]) 09/26/2015  . Allergic rhinitis 09/26/2015  . 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 Movement disorder 09/22/2015  . Encephalitis 09/22/2015  . Chest pain 02/07/2014  . Premature atrial contractions 01/13/2014  . Abnormality of gait 12/04/2013  . Hyperlipidemia 12/21/2011  . Cardiovascular risk factor 12/21/2011  . Bunion 01/31/2008  . Metatarsalgia of both feet 09/20/2007  . FLAT FOOT 09/20/2007  . HALLUX RIGIDUS, ACQUIRED 09/20/2007    Alda Lea, PT 07/05/2017, 6:19 PM  [facility] [address] [place], [place], [address] Phone: [phone]   Fax:  [phone]  Name: Jolean Madariaga MRN: [id number] Date of Birth:  [birth year]

## 2017-07-06 ENCOUNTER — Ambulatory Visit: Payer: Medicare Other | Admitting: Occupational Therapy

## 2017-07-06 ENCOUNTER — Ambulatory Visit: Payer: Medicare Other

## 2017-07-06 ENCOUNTER — Encounter: Payer: Self-pay | Admitting: Physical Therapy

## 2017-07-06 ENCOUNTER — Ambulatory Visit: Payer: Medicare Other | Admitting: Physical Therapy

## 2017-07-06 ENCOUNTER — Encounter: Payer: Self-pay | Admitting: Occupational Therapy

## 2017-07-06 DIAGNOSIS — R293 Abnormal posture: Secondary | ICD-10-CM

## 2017-07-06 DIAGNOSIS — M6281 Muscle weakness (generalized): Secondary | ICD-10-CM

## 2017-07-06 DIAGNOSIS — M25612 Stiffness of left shoulder, not elsewhere classified: Secondary | ICD-10-CM

## 2017-07-06 DIAGNOSIS — R2689 Other abnormalities of gait and mobility: Secondary | ICD-10-CM

## 2017-07-06 DIAGNOSIS — R29818 Other symptoms and signs involving the nervous system: Secondary | ICD-10-CM

## 2017-07-06 DIAGNOSIS — R471 Dysarthria and anarthria: Secondary | ICD-10-CM | POA: Diagnosis not present

## 2017-07-06 DIAGNOSIS — R41841 Cognitive communication deficit: Secondary | ICD-10-CM

## 2017-07-06 DIAGNOSIS — R2681 Unsteadiness on feet: Secondary | ICD-10-CM

## 2017-07-06 DIAGNOSIS — R1312 Dysphagia, oropharyngeal phase: Secondary | ICD-10-CM

## 2017-07-06 DIAGNOSIS — G8250 Quadriplegia, unspecified: Secondary | ICD-10-CM

## 2017-07-06 DIAGNOSIS — M25611 Stiffness of right shoulder, not elsewhere classified: Secondary | ICD-10-CM

## 2017-07-06 DIAGNOSIS — M25622 Stiffness of left elbow, not elsewhere classified: Secondary | ICD-10-CM

## 2017-07-06 DIAGNOSIS — R4701 Aphasia: Secondary | ICD-10-CM

## 2017-07-06 DIAGNOSIS — R41844 Frontal lobe and executive function deficit: Secondary | ICD-10-CM

## 2017-07-07 ENCOUNTER — Telehealth: Payer: Self-pay | Admitting: Pulmonary Disease

## 2017-07-07 NOTE — Therapy (Signed)
[facility] [address] [place], [place], [address] Phone: [phone]   Fax:  [phone]  Occupational Therapy Treatment  Patient Details  Name: Judy Spears MRN: [id number] Date of Birth: [birth year] Referring Provider: Dr. Levada Schilling   Encounter Date: 07/06/2017  OT End of Session - 07/06/17 0829    Visit Number  16    Number of Visits  40    Date for OT Re-Evaluation  10/11/17    Authorization Type  medicare     Authorization Time Period  90 days    OT Start Time  1103    OT Stop Time  1145    OT Time Calculation (min)  42 min    Activity Tolerance  Patient tolerated treatment well       Past Medical History:  Diagnosis Date  . Arthritis    lt great toe  . Complication of anesthesia    pt has had headaches post op-did advise to hydra well preop  . Hair loss    right-sided  . History of exercise stress test    a. ETT (12/15) with 12:00 exercise, no ST changes, occasional PACs.   . Hyperlipidemia   . Insomnia   . Migraine headache   . Movement disorder    resltess in left legs  . Postmenopausal   . 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 Premature atrial contractions    a. Holter (12/15) with 8% PACs, no atrial runs or atrial fibrillation.     Past Surgical History:  Procedure Laterality Date  . BREAST BIOPSY  01/01/2011   Procedure: BREAST BIOPSY WITH NEEDLE LOCALIZATION;  Surgeon: Rolm Bookbinder, MD;  Location: [facility];  Service: General;  Laterality: Left;  left breast wire localization biopsy  . BREAST BIOPSY Right 07/27/2012  . BREAST BIOPSY Right 07/07/2016  . BREAST BIOPSY Left 06/30/2016  . cataracts right eye    . CESAREAN SECTION  1986  . HERNIA REPAIR  2000   [facility]  . INTRAMEDULLARY (IM) NAIL INTERTROCHANTERIC Left 01/30/2017   Procedure: ORIF AFFIXUS NAIL;  Surgeon: Paralee Cancel, MD;  Location: [facility];  Service: Orthopedics;  Laterality: Left;  . IR REPLC  GASTRO/COLONIC TUBE PERCUT W/FLUORO  09/29/2016  . opirectinal membrane peel    . PEG PLACEMENT    . RHINOPLASTY  1983  . TRACHEOSTOMY    . TRACHEOSTOMY TUBE PLACEMENT N/A 12/15/2016   Procedure: TRACHEOSTOMY CHANGE;  Surgeon: [name] Montane, MD;  Location: [facility];  Service: ENT;  Laterality: N/A;    There were no vitals filed for this visit.  Subjective Assessment - 07/06/17 1339    Subjective   Yes when asked if she was ready to work    Patient is accompained by:  Family member husband [name] and caregiver [place]    Pertinent History  see epic snapshot - ABI/encephalitis/hypoxia from virus; recent hospitalization due to MRSA PNA     Patient Stated Goals  Pt unable to state.     Currently in Pain?  No/denies       Treatment:  Neuro re ed to address postural alignment in sitting and standing, static and dynamic sitting and standing balance, transitional movements from sit to stand, stand to sit as well as use of stepping for forced use of trunk, LE's and UE's for postural control.  Significant neuro re ed facilitation to assist pt with all activities however pt able to actively participate today in all activities.  Given the  severe complex nature of pt's clinical presentation including: 1. Frequent, often sudden, medical declines including significant recurring respiratory issues, risk for sepsis, severe osteoporosis with h/o of multiple pathological fractures. 2. Severe multisystem neurological impairment including: - severe spastic tetraplegia - significant cognitive impairments including fluctuating level of attention and arousal, memory, basic processing of information, ability to consistently follow 1-2 step commands. - significant visual perceptual impairments including directionality, postural awareness, body in space, body relative to environment, depth perception -vestibular impairment with resulting nystagmus with positional changes and changes in emotional response with transitional  movements. - severe musculoskeletal deficits including contractures (bilateral shoulders, L elbow, L hip, bilateral ankles), severely decreased ROM of trunk and trunk mobility affecting respiration, phonation, feeding and functional mobility - significant disturbances in postural orientation and control in all planes (supine, sitting, standing and stepping) requiring skilled therapeutic facilitation for continued mobility and balance.  Patient requires the intervention of skilled therapeutic intervention due to the above clinical presentation as well as the patient's status often fluctuates from session to session requiring advanced clinical reasoning to adjust treatment interventions based on pt response on a regular basis.    Patient at this time will be moved from Rehabilitative Therapy to Maintenance Therapy. Given the above issues pt is at very high risk for: - decline of current level of functional mobility that allows pt to be cared for by one care giver - development of additional co-morbidities including but not limited to further contracture, worsening spasticity, respiratory complications, risk of skin break down, and further declined in trunk and extremity mobility.  Pt to be seen 2x/wk x12 weeks with re- assessment at 90 days.                           OT Short Term Goals - 07/06/17 0819      OT SHORT TERM GOAL #1   Title  Pt will be mod a for sit to stand from toilet in steady to reduce care giver burden. - 06/07/2017    Status  Not Met      OT SHORT TERM GOAL #2   Title  Pt will sit on level surface with 1 UE support only in preparation for participation in basic self care tasks for at least 3 minutes consistently    Status  Achieved      OT SHORT TERM GOAL #3   Title  Pt will maintain static standing balance with BUE support during ADL activity  with mod a for at least 3 minutes consistently    Status  Not Met      OT SHORT TERM GOAL #4   Title  Pt  will roll during LB dressing with max a x1 to reduce caregiver burden.     Status  Not Met      OT SHORT TERM GOAL #5   Title  Pt will demonstrate ability to follow simple, functional commands in context at least 50% of the time.     Status  Achieved      OT SHORT TERM GOAL #6   Title  Pt will maintain ability to roll in bed during self care tasks with no more than max a x1 - 08/17/2017    Status  New      OT SHORT TERM GOAL #7   Title  Pt will maintain the ability to use roll in shower chair with care giver assistance     Status  New  OT Long Term Goals - 07/06/17 0820      OT LONG TERM GOAL #1   Title  Pt will go from sit to stand from toilet with steady with min a to reduce caregiver burden - 07/05/2017    Status  Not Met      OT LONG TERM GOAL #2   Title  Pt will sit on level surfaces for participation in basic self care tasks with no UE support for at least 5 minutes    Status  Not Met      OT LONG TERM GOAL #3   Title  Pt will maintain static standing balance with BUE support duirng ADL activity with min a for at least 3 minutes    Status  Not Met      OT LONG TERM GOAL #4   Title  Pt wil roll during LB dressing with mod a x1 to reduce caregiver burden.      Status  Not Met      OT LONG TERM GOAL #5   Title  Pt will maintain ability to transfer to toilet with no more than max a x1 using Steady Assist - 10/11/2017    Status  New      OT LONG TERM GOAL #6   Title  Pt wil maintain ability for dressing at max a x1.     Status  New      OT LONG TERM GOAL #7   Title  Pt will maintain ability to stand with max a x1 to for clothing mgmt with toileting.     Status  New            Plan - 07/06/17 0820    Clinical Impression Statement  Pt has only met 2 STG's.  Pt presents as a severely medically and neurologically complex patient.  Caregivers and husband have been educated extensively with good follow through however given the significantly complex impairments,  ongoing medical issues and patient fluctuations from session to session requiring continual advanced clinical reasoning to respond to patient's needs, feel that this patient is most appropriate at this time for maintainence therapy provided by a skilled Occupational Therapist.  Pt is at signficant risk for futher development of comorbidities that can be impacted by the continued care of skilled therapy.  See body of note for specifics.     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    OT Frequency  2x / week    OT Duration  12 weeks    OT Treatment/Interventions  Self-care/ADL training;Ultrasound;Moist Heat;Electrical Stimulation;DME and/or AE instruction;Neuromuscular education;Therapeutic exercise;Functional Mobility Training;Manual Therapy;Passive range of motion;Splinting;Therapeutic activities;Balance training;Patient/family education;Visual/perceptual remediation/compensation;Cognitive remediation/compensation    Plan   NMR trunk and LE's for transitional movements, static standing, sitting balance, sit to stand, stand to sit, stepping; address tone mgmt    Consulted and Agree with Plan of Care  Patient;Family member/caregiver    Family Member Consulted  husband [name] and caregiver [name]       Patient will benefit from skilled therapeutic intervention in order to improve the following deficits and impairments:  Decreased endurance, Decreased skin integrity, Impaired vision/preception, Improper body mechanics, Impaired perceived  functional ability, Decreased knowledge of precautions,  Cardiopulmonary status limiting activity, Decreased activity tolerance, Decreased knowledge of use of DME, Decreased strength, Impaired flexibility, Improper spinal/pelvic alignment, Impaired sensation, Difficulty walking, Decreased mobility, Decreased balance, Decreased cognition, Decreased range of motion, Impaired tone, Impaired UE functional use, Decreased safety awareness, Decreased coordination, Pain, Abnormal gait  Visit Diagnosis: Other abnormalities of gait and mobility - Plan: Ot plan of care cert/re-cert  Other symptoms and signs involving the nervous system - Plan: Ot plan of care cert/re-cert  Abnormal posture - Plan: Ot plan of care cert/re-cert  Unsteadiness on feet - Plan: Ot plan of care cert/re-cert  Muscle weakness (generalized) - Plan: Ot plan of care cert/re-cert  Frontal lobe and executive function deficit - Plan: Ot plan of care cert/re-cert  Stiffness of right shoulder, not elsewhere classified - Plan: Ot plan of care cert/re-cert  Stiffness of left shoulder, not elsewhere classified - Plan: Ot plan of care cert/re-cert  Stiffness of left elbow, not elsewhere classified - Plan: Ot plan of care cert/re-cert  Spastic tetraplegia ([place]) - Plan: Ot plan of care cert/re-cert    Problem List Patient Active Problem List   Diagnosis Date Noted  . Coagulase negative Staphylococcus bacteremia 02/09/2017  . Abdominal distension   . Chronic mucus hypersecretion, respiratory 12/31/2016  . Severe protein-calorie malnutrition [name]: less than 60% of standard weight) ([place]) 12/31/2016  . Acute urinary retention 12/31/2016  . Constipation 12/31/2016  . Shortness of breath   . Respiratory distress   . Pneumonia 12/05/2016  . Acute respiratory failure ([place]) 12/05/2016  . Pseudomonas urinary tract infection 10/27/2016  . Severe muscle deconditioning 09/30/2016  . Pressure injury of skin 09/28/2016  . Respiratory failure ([place])   . Tracheobronchitis   . Urinary retention    . PEG (percutaneous endoscopic gastrostomy) status ([place])   . Benign essential HTN   . Hyponatremia   . Hypokalemia   . Reactive hypertension   . Respiratory insufficiency 09/24/2016  . Malignant neoplasm of upper-inner quadrant of left breast in female, estrogen receptor positive ([place]) 07/14/2016  . Heterotopic ossification of bone 03/10/2016  . Acute on chronic respiratory failure with hypoxia ([place])   . Acute hypoxemic respiratory failure ([place])   . Acute blood loss anemia   . Atelectasis 02/04/2016  . Debility 02/03/2016  . Pneumonia of both lower lobes due to Pseudomonas species ([place])   . Pneumonia of both lower lobes due to methicillin susceptible Staphylococcus aureus (MSSA) ([place])   . Acute respiratory failure with hypoxia ([place])   . Acute encephalopathy   . Seizures ([place])   . Sepsis ([place])   . Hypoxia   . Pain   . Chronic respiratory failure ([place]) 12/28/2015  . Acute tracheobronchitis 12/24/2015  . Tracheostomy tube present ([place])   . Tracheal stenosis   . Chronic respiratory failure with hypoxia ([place])   . Spastic tetraplegia ([place]) 10/20/2015  . Tracheostomy status ([place]) 09/26/2015  . Allergic rhinitis 09/26/2015  . 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 Movement disorder 09/22/2015  . Encephalitis 09/22/2015  . Chest pain 02/07/2014  . Premature atrial contractions 01/13/2014  . Abnormality of gait 12/04/2013  . Hyperlipidemia 12/21/2011  . Cardiovascular risk factor 12/21/2011  . Bunion 01/31/2008  . Metatarsalgia of both feet 09/20/2007  . FLAT FOOT 09/20/2007  . HALLUX RIGIDUS, ACQUIRED 09/20/2007    Quay Burow, OTR/L 07/07/2017, 8:42 AM  [facility] [address] [place], [place], [address] Phone: [phone]   Fax:  [place]  Name: Jermia Rigsby MRN: [id number] Date of Birth: January 02, 1951

## 2017-07-07 NOTE — Telephone Encounter (Signed)
Called patients husband. Unable to reach. Left detailed message with results. Nothing further needed.

## 2017-07-07 NOTE — Therapy (Signed)
[facility] [address] [place], [place], [address] Phone: [phone]   Fax:  [phone]  Speech Language Pathology Treatment  Patient Details  Name: Judy Spears MRN: [id number] Date of Birth: July 30, 1950 Referring Provider: Levada Schilling, MD   Encounter Date: 07/06/2017  End of Session - 07/07/17 2216    Visit Number  15    Number of Visits  17    Date for SLP Re-Evaluation  07/22/17    SLP Start Time  1017    SLP Stop Time   1100    SLP Time Calculation (min)  43 min    Activity Tolerance  Patient tolerated treatment well       Past Medical History:  Diagnosis Date  . Arthritis    lt great toe  . Complication of anesthesia    pt has had headaches post op-did advise to hydra well preop  . Hair loss    right-sided  . History of exercise stress test    a. ETT (12/15) with 12:00 exercise, no ST changes, occasional PACs.   . Hyperlipidemia   . Insomnia   . Migraine headache   . Movement disorder    resltess in left legs  . Postmenopausal   . 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 Premature atrial contractions    a. Holter (12/15) with 8% PACs, no atrial runs or atrial fibrillation.     Past Surgical History:  Procedure Laterality Date  . BREAST BIOPSY  01/01/2011   Procedure: BREAST BIOPSY WITH NEEDLE LOCALIZATION;  Surgeon: Rolm Bookbinder, MD;  Location: [facility];  Service: General;  Laterality: Left;  left breast wire localization biopsy  . BREAST BIOPSY Right 07/27/2012  . BREAST BIOPSY Right 07/07/2016  . BREAST BIOPSY Left 06/30/2016  . cataracts right eye    . CESAREAN SECTION  1986  . HERNIA REPAIR  2000   RIH  . INTRAMEDULLARY (IM) NAIL INTERTROCHANTERIC Left 01/30/2017   Procedure: ORIF AFFIXUS NAIL;  Surgeon: Paralee Cancel, MD;  Location: [facility];  Service: Orthopedics;  Laterality: Left;  . IR REPLC GASTRO/COLONIC TUBE PERCUT W/FLUORO  09/29/2016  . opirectinal membrane  peel    . PEG PLACEMENT    . RHINOPLASTY  1983  . TRACHEOSTOMY    . TRACHEOSTOMY TUBE PLACEMENT N/A 12/15/2016   Procedure: TRACHEOSTOMY CHANGE;  Surgeon: [name] Montane, MD;  Location: [facility];  Service: ENT;  Laterality: N/A;    There were no vitals filed for this visit.  Subjective Assessment - 07/07/17 2203    Subjective  SLP used parallel bars today to keep pt alert/aroused.    Patient is accompained by:  Family member [name] (husband), [name] (caregiver)    Currently in Pain?  No/denies            ADULT SLP TREATMENT - 07/07/17 0001      General Information   Behavior/Cognition  Alert;Pleasant mood;Distractible;Requires cueing      Treatment Provided   Treatment provided  Cognitive-Linquistic      Cognitive-Linquistic Treatment   Treatment focused on  Dysarthria    Skilled Treatment  Pt remained aroused for approx 95% of session. Hydrophonic voice for first 30 minutes until aide gave pt water (aide said she provided oral care just prior to ST). Voice lceared for approx 3 minutes but hydophonic voice returned - cleared with cued swallow. Pt frequency of voice more successful when pt was in sitting position than in standing, compared to previous session.  When pt sat down torso was more upright and pt had greater frequency of audible speech than in standing. Pt's frequency of voice and of intelligible utterance were as follows today: IMITATION: Voice at functional level; 1-syllable 100% (1/1), 2-syllable- 50% (2/4), 3 syllable 50% (1/2), 4 syllable 0% (0/1). Intelligibility; 1-syllable 100% (1/1), 2-syllable 50% (2/4), 3-syllable 50% (1/2), 4 syllable 0% (0/1). SPONTANEOUS: Voice at functional level; 1-syllable 4/4 (100%), 2-syllable 56% (9/16), 3 syllable 60% (6/10), 4 syllable 0% (0/1), 4+ syllable 33% (1/3). Functionally Intelligible: 1-syllable 100% (4/4), 2-syllable 39% (6/15), 3 syllable 30% (3/10), 4 syllable 0% (0/1), 4+ syllable 33% (1/3). SLP did not understand 4 utterances that  aide/family did.      Assessment / Recommendations / Plan   Plan  Continue with current plan of care      Progression Toward Goals   Progression toward goals  Progressing toward goals       SLP Education - 07/07/17 2240    Education provided  Yes    Education Details  "breathe, loud, and enunciate" (for this and 5-6 previous sessions with this SLP)    Person(s) Educated  Patient;Spouse;Caregiver(s)    Methods  Explanation;Demonstration;Verbal cues    Comprehension  Returned demonstration;Verbal cues required;Need further instruction       SLP Short Term Goals - 06/27/17 [place] #1   Title  Pt will demonstrate dysarthria HEP to increase articulatory coordination for speech with usual mod A over 3 sessions    Status  Deferred      SLP [place] #2   Title  Pt will achieve phonation adequate for SLP intelligibility in imitated two-syllable words using intelligibility compensations 12/20 trials with usual min A in 3 sessions    Status  Not Met      SLP SHORT TERM GOAL #3   Title  Pt will imitate 2-syllable words or phrases with functional intelligibility for SLP in 5/15 trials over 3 sessions     Status  Not Met      SLP SHORT TERM GOAL #4   Title  Pt will sustain attention to therapy tasks for 10 minutes with multimodal cues in 5 sessions    Status  Partially Met      SLP SHORT TERM GOAL #5   Title  pt family will demo understanding of oral care regimen, including safe ingestion of water/ice chips over three sessions    Status  Achieved      SLP SHORT TERM GOAL #6   Title  pt will demo dysphagia HEP to improve management of secretions and safety with any possible POs    Status  Deferred      SLP SHORT TERM GOAL #7   Title  family/caregivers will demo understanding of pt swallowing precautions, for pt safety with POs, by verbal report     Status  Achieved       SLP Long Term Goals - 07/07/17 2233      SLP LONG TERM GOAL #1   Title  Pt will  demo dysarthria  HEP to incr articulatory coordination for speech, with occasional mod A over three sessions    Time  2    Period  Weeks    Status  On-going      SLP LONG TERM GOAL #2   Title  pt will achieve phonation adequate for SLP intelligibility in imitated 3-syllable words/3-word phrases using intelligibility compenastions 15/20 trials  Time  2    Period  Weeks    Status  On-going      SLP LONG TERM GOAL #3   Title  Pt will imitate 2-syllable words or phrases with functional intelligibility for SLP in 8/15 trials over three sessions    Baseline  07-04-17    Time  2    Period  Weeks    Status  On-going      SLP LONG TERM GOAL #4   Title  pt will sustain attention to therapy tasks for 10 minutes with multimodal cues in 10 sessions    Baseline  06-10-17, 07-04-17, 07-06-17    Time  2    Period  Weeks    Status  On-going      SLP LONG TERM GOAL #5   Title  pt will demo dysphagia HEP to improve management of secretions and safety with possible POs    Period  Weeks    Status  Deferred       Plan - 07/07/17 2218    Clinical Impression Statement  Patient presents with ongoing deficits in speech intelligibility (dysarthria, reduced breath support), cognitive communication and swallowing. See skilled treatment for details of voicing, functional speech intelligilibty for imitation and spontaneous production of 1-4+ syllable words/phrases. Consider a break from direct skilled ST - SLP to discuss options for pt's care at this point with family, as pt progress has been less than expected.    Speech Therapy Frequency  2x / week    Duration  -- 8 weeks/17 sessions    Treatment/Interventions  Aspiration precaution training;Trials of upgraded texture/liquids;Compensatory strategies;Functional tasks;Patient/family education;Cueing hierarchy;Diet toleration management by SLP;Cognitive reorganization;Compensatory techniques;SLP instruction and feedback;Internal/external aids;Multimodal  communcation approach;Pharyngeal strengthening exercises;Oral motor exercises    Potential to Achieve Goals  Fair    Potential Considerations  Ability to learn/carryover information;Co-morbidities;Severity of impairments;Cooperation/participation level;Previous level of function       Patient will benefit from skilled therapeutic intervention in order to improve the following deficits and impairments:   Dysarthria and anarthria  Cognitive communication deficit  Dysphagia, oropharyngeal phase  Aphasia    Problem List Patient Active Problem List   Diagnosis Date Noted  . Coagulase negative Staphylococcus bacteremia 02/09/2017  . Abdominal distension   . Chronic mucus hypersecretion, respiratory 12/31/2016  . Severe protein-calorie malnutrition [name]: less than 60% of standard weight) ([place]) 12/31/2016  . Acute urinary retention 12/31/2016  . Constipation 12/31/2016  . Shortness of breath   . Respiratory distress   . Pneumonia 12/05/2016  . Acute respiratory failure ([place]) 12/05/2016  . Pseudomonas urinary tract infection 10/27/2016  . Severe muscle deconditioning 09/30/2016  . Pressure injury of skin 09/28/2016  . Respiratory failure ([place])   . Tracheobronchitis   . Urinary retention   . PEG (percutaneous endoscopic gastrostomy) status ([place])   . Benign essential HTN   . Hyponatremia   . Hypokalemia   . Reactive hypertension   . Respiratory insufficiency 09/24/2016  . Malignant neoplasm of upper-inner quadrant of left breast in female, estrogen receptor positive ([place]) 07/14/2016  . Heterotopic ossification of bone 03/10/2016  . Acute on chronic respiratory failure with hypoxia ([place])   . Acute hypoxemic respiratory failure ([place])   . Acute blood loss anemia   . Atelectasis 02/04/2016  . Debility 02/03/2016  . Pneumonia of both lower lobes due to Pseudomonas species ([place])   . Pneumonia of both lower lobes due to methicillin susceptible Staphylococcus aureus (MSSA) ([place])   .  Acute respiratory  failure with hypoxia ([place])   . Acute encephalopathy   . Seizures ([place])   . Sepsis ([place])   . Hypoxia   . Pain   . Chronic respiratory failure ([place]) 12/28/2015  . Acute tracheobronchitis 12/24/2015  . Tracheostomy tube present ([place])   . Tracheal stenosis   . Chronic respiratory failure with hypoxia ([place])   . Spastic tetraplegia ([place]) 10/20/2015  . Tracheostomy status ([place]) 09/26/2015  . Allergic rhinitis 09/26/2015  . 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 Movement disorder 09/22/2015  . Encephalitis 09/22/2015  . Chest pain 02/07/2014  . Premature atrial contractions 01/13/2014  . Abnormality of gait 12/04/2013  . Hyperlipidemia 12/21/2011  . Cardiovascular risk factor 12/21/2011  . Bunion 01/31/2008  . Metatarsalgia of both feet 09/20/2007  . FLAT FOOT 09/20/2007  . HALLUX RIGIDUS, ACQUIRED 09/20/2007    [facility] ,MS, CCC-SLP  07/07/2017, 10:48 PM  [facility] [address] [place], [place], [address] Phone: [phone]   Fax:  [phone]   Name: Judy Spears MRN: [id number] Date of Birth: [birth year]

## 2017-07-07 NOTE — Therapy (Signed)
[facility] [address] [place], [place], [address] Phone: [phone]   Fax:  [phone]  Physical Therapy Treatment  Patient Details  Name: Judy Spears MRN: [id number] Date of Birth: 27-Feb-1950 Referring Provider: Dr. Levada Schilling   Encounter Date: 07/06/2017  PT End of Session - 07/06/17 1700    Visit Number  15    Number of Visits  17    Date for PT Re-Evaluation  07/09/17    Authorization Type  [employer] Medicare    Authorization Time Period  05-10-17 - 08-08-17    PT Start Time  1147    PT Stop Time  1230    PT Time Calculation (min)  43 min    Activity Tolerance  Patient tolerated treatment well;No increased pain    Behavior During Therapy  WFL for tasks assessed/performed       Past Medical History:  Diagnosis Date  . Arthritis    lt great toe  . Complication of anesthesia    pt has had headaches post op-did advise to hydra well preop  . Hair loss    right-sided  . History of exercise stress test    a. ETT (12/15) with 12:00 exercise, no ST changes, occasional PACs.   . Hyperlipidemia   . Insomnia   . Migraine headache   . Movement disorder    resltess in left legs  . Postmenopausal   . 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 Premature atrial contractions    a. Holter (12/15) with 8% PACs, no atrial runs or atrial fibrillation.     Past Surgical History:  Procedure Laterality Date  . BREAST BIOPSY  01/01/2011   Procedure: BREAST BIOPSY WITH NEEDLE LOCALIZATION;  Surgeon: Rolm Bookbinder, MD;  Location: [facility];  Service: General;  Laterality: Left;  left breast wire localization biopsy  . BREAST BIOPSY Right 07/27/2012  . BREAST BIOPSY Right 07/07/2016  . BREAST BIOPSY Left 06/30/2016  . cataracts right eye    . CESAREAN SECTION  1986  . HERNIA REPAIR  2000   RIH  . INTRAMEDULLARY (IM) NAIL INTERTROCHANTERIC Left 01/30/2017   Procedure: ORIF AFFIXUS NAIL;  Surgeon: Paralee Cancel, MD;  Location: [facility];  Service: Orthopedics;  Laterality: Left;  . IR REPLC GASTRO/COLONIC TUBE PERCUT W/FLUORO  09/29/2016  . opirectinal membrane peel    . PEG PLACEMENT    . RHINOPLASTY  1983  . TRACHEOSTOMY    . TRACHEOSTOMY TUBE PLACEMENT N/A 12/15/2016   Procedure: TRACHEOSTOMY CHANGE;  Surgeon: [name] Montane, MD;  Location: [facility];  Service: ENT;  Laterality: N/A;    There were no vitals filed for this visit.  Subjective Assessment - 07/06/17 1149    Subjective  No new complaints.     Patient is accompained by:  Family member    Pertinent History  [place] viral encephalitis; anoxic BI:  ORIF Lt hip 02-09-17;  Rt THR  04-12-17         [facility] Adult PT Treatment/Exercise - 07/06/17 1332      Transfers   Transfers  Sit to Stand;Stand to Sit    Sit to Stand  4: Min assist    Sit to Stand Details  Tactile cues for sequencing;Tactile cues for posture;Tactile cues for weight shifting;Verbal cues for sequencing;Verbal cues for technique;Manual facilitation for weight shifting;Manual facilitation for placement;Manual facilitation for weight bearing    Sit to Stand Details (indicate cue type and reason)  min assist for all  stands today with cues/facilitation for anterior weight shifting. pt did demo initiation of fwd weight shifting and LE activiation with each stand as well.                     Stand to Sit  4: Min assist    Stand to Sit Details (indicate cue type and reason)  Tactile cues for sequencing;Tactile cues for weight shifting;Tactile cues for posture;Tactile cues for placement;Verbal cues for sequencing;Verbal cues for technique;Manual facilitation for weight shifting;Manual facilitation for placement;Manual facilitation for weight bearing    Stand to Sit Details  cues for hip/knee flexion to sit with pt reaching back with right UE each time.     Stand Pivot Transfers  4: Min assist    Stand Pivot Transfer Details (indicate cue type and reason)  from wheelchair to mat table  with cues on step sequencing and posture, once at mat edge pt side stepped ~3 feet with min assist and cues as well.       Ambulation/Gait   Ambulation/Gait  Yes    Ambulation/Gait Assistance  2: Max assist of 2 people    Ambulation/Gait Assistance Details  mod/max assist of 2 people (one on each side of pt): cues/facilitation for upright posture, weight shifting and step length. pt able to self advance right leg each step, min assist needed with left LE ~75% of steps. utilized blue rocker brace with small heel wedge to right LE, toe off brace to left LE and bil simulated toe caps (using blue shoe covers) with gait.                      Ambulation Distance (Feet)  48 Feet x1, 18 x1    Assistive device  2 person hand held assist    Gait Pattern  Step-to pattern;Step-through pattern;Decreased stride length;Decreased dorsiflexion - right;Decreased dorsiflexion - left;Decreased weight shift to left;Ataxic;Trunk flexed;Narrow base of support;Poor foot clearance - left    Ambulation Surface  Level;Indoor      Knee/Hip Exercises: Stretches   Passive Hamstring Stretch  Both;2 reps;60 seconds    Passive Hamstring Stretch Limitations  with blue wedge/pillows under trunk/head.    Other Knee/Hip Stretches  lower trunk rotation stretch for 30 sec holds x 3 each side with gentle overpressure to opposite shoulder for stabilization and increased stretch      Knee/Hip Exercises: Supine   Heel Slides  AAROM;Both;AROM;1 set;10 reps    Heel Slides Limitations  x 10 AROM each leg in pt's self selected range of motion, then 5 reps each leg AAROM with flexion stretch at end range.    Bridges  AROM;Both;1 set;10 reps    Bridges Limitations  min assist to stabilize bil. LE's    Other Supine Knee/Hip Exercises  in hooklying: hip fall outs to PTA target hand/back in x 10 reps each leg, then bil LE's together for clam shells x 10 reps.            PT Short Term Goals - 06/10/17 1305      PT SHORT TERM GOAL #1    Title  Perform sit to/from stand from wheelchair or mat with +1 mod assist.    Baseline  06/09/16: met today    Status  Achieved      PT SHORT TERM GOAL #2   Title  Pt will perform sit to stand from wheelchair to RW with max assist for pre-gait skill.    Baseline  06/09/17:  met today    Status  Achieved      PT SHORT TERM GOAL #3   Title  Pt will transfer supine to sit with max assist.    Baseline  06/09/17: met today, needs increased time to assist    Status  Achieved      PT SHORT TERM GOAL #4   Title  Pt will sit with RUE support with min to mod assist unsupported on side of mat for at least 3" to demo improved trunk strength and core stabilization.    Baseline  06/09/17: met today     Status  Achieved      PT SHORT TERM GOAL #5   Title  Pt will amb. 15' with appropriate assistive device with max assist +1    Baseline  06/09/17: pt now needs mod/max assist of 2 people for gait using modified 3 muskateer assist and blue rocker AFO to right LE    Status  Partially Met        PT Long Term Goals - 05/11/17 2122      PT LONG TERM GOAL #1   Title  Pt will perform supine rolling R and L with min assist  to indicate improved independence with bed mobility.      Baseline  dependent for all bed mobility per spouse's report - 05-10-17    Time  8    Period  Weeks    Status  New    Target Date  07/09/17      PT LONG TERM GOAL #2   Title  Pt will sit on side of mat with RUE support for at least 5" with SBA     Baseline  max assist to min assist - ability varies    Time  8    Period  Weeks    Status  New    Target Date  07/09/17      PT LONG TERM GOAL #3   Title  Pt will amb. 20' with appropriate assistive device with +1 max assist.    Baseline  +2 max - total assist- pt unable to advance LE's - feet not clearing due to pt not wearing AFO's - 05-10-17    Time  8    Period  Weeks    Status  New    Target Date  07/09/17      PT LONG TERM GOAL #4   Title  Pt will perform basic  transfers with min to mod assist to indicate improved functional independence.      Baseline  max assist - 05-10-17    Time  8    Period  Weeks    Target Date  07/09/17      PT LONG TERM GOAL #5   Title  Pt will perform static standing x 3" with bil. UE support with mod assist.      Time  8    Period  Weeks    Status  New    Target Date  07/09/17      Additional Long Term Goals   Additional Long Term Goals  Yes      PT LONG TERM GOAL #6   Title  Pt will be able to return to participation in therapeutic horseback riding at [facility] for ongoing exercise program.    Time  8    Period  Weeks    Status  New    Target Date  07/09/17  Plan - 07/06/17 1700    Clinical Impression Statement  Today's skilled session continued to address LE streching/strengthening, transfers and gait with 2 person assistance. Pt more alert today and more on level of activity shown on her 06/27/17 session with less assistance needed today vs last session. Pt does continue to vary in her participation and level of assist needed depending on her alertness. Today she made progress toward her goals. Pt should benefit from continued PT to progress toward unmet goals.                             Rehab Potential  Fair    Clinical Impairments Affecting Rehab Potential  severity of deficits with previous extensive therapies    PT Frequency  2x / week    PT Duration  8 weeks    PT Treatment/Interventions  ADLs/Self Care Home Management;Therapeutic exercise;Therapeutic activities;Functional mobility training;Gait training;Balance training;Neuromuscular re-education;Patient/family education;Orthotic Fit/Training;Passive range of motion    PT Next Visit Plan  cont LE stretching, gait training,  activities to facilitate trunk flexion    PT Home Exercise Plan  caregiver and husband have previously been instructed in ROM, sitting balance, and transfer training    Consulted and Agree with Plan of Care  Patient        Patient will benefit from skilled therapeutic intervention in order to improve the following deficits and impairments:  Abnormal gait, Decreased activity tolerance, Decreased balance, Decreased cognition, Decreased coordination, Decreased mobility, Decreased range of motion, Decreased endurance, Hypomobility, Decreased strength, Impaired flexibility, Increased muscle spasms, Impaired UE functional use, Postural dysfunction, Pain  Visit Diagnosis: Other abnormalities of gait and mobility  Unsteadiness on feet  Muscle weakness (generalized)  Abnormal posture     Problem List Patient Active Problem List   Diagnosis Date Noted  . Coagulase negative Staphylococcus bacteremia 02/09/2017  . Abdominal distension   . Chronic mucus hypersecretion, respiratory 12/31/2016  . Severe protein-calorie malnutrition [name]: less than 60% of standard weight) ([place]) 12/31/2016  . Acute urinary retention 12/31/2016  . Constipation 12/31/2016  . Shortness of breath   . Respiratory distress   . Pneumonia 12/05/2016  . Acute respiratory failure ([place]) 12/05/2016  . Pseudomonas urinary tract infection 10/27/2016  . Severe muscle deconditioning 09/30/2016  . Pressure injury of skin 09/28/2016  . Respiratory failure ([place])   . Tracheobronchitis   . Urinary retention   . PEG (percutaneous endoscopic gastrostomy) status ([place])   . Benign essential HTN   . Hyponatremia   . Hypokalemia   . Reactive hypertension   . Respiratory insufficiency 09/24/2016  . Malignant neoplasm of upper-inner quadrant of left breast in female, estrogen receptor positive ([place]) 07/14/2016  . Heterotopic ossification of bone 03/10/2016  . Acute on chronic respiratory failure with hypoxia ([place])   . Acute hypoxemic respiratory failure ([place])   . Acute blood loss anemia   . Atelectasis 02/04/2016  . Debility 02/03/2016  . Pneumonia of both lower lobes due to Pseudomonas species ([place])   . Pneumonia of both lower lobes due to  methicillin susceptible Staphylococcus aureus (MSSA) ([place])   . Acute respiratory failure with hypoxia ([place])   . Acute encephalopathy   . Seizures ([place])   . Sepsis ([place])   . Hypoxia   . Pain   . Chronic respiratory failure ([place]) 12/28/2015  . Acute tracheobronchitis 12/24/2015  . Tracheostomy tube present ([place])   . Tracheal stenosis   . Chronic respiratory failure with  hypoxia ([place])   . Spastic tetraplegia ([place]) 10/20/2015  . Tracheostomy status ([place]) 09/26/2015  . Allergic rhinitis 09/26/2015  . 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 Movement disorder 09/22/2015  . Encephalitis 09/22/2015  . Chest pain 02/07/2014  . Premature atrial contractions 01/13/2014  . Abnormality of gait 12/04/2013  . Hyperlipidemia 12/21/2011  . Cardiovascular risk factor 12/21/2011  . Bunion 01/31/2008  . Metatarsalgia of both feet 09/20/2007  . FLAT FOOT 09/20/2007  . HALLUX RIGIDUS, ACQUIRED 09/20/2007    Willow Ora, PTA, [facility] [address] [place], [place] [address] [phone] 07/07/17, 1:42 PM   Name: Leiah Giannotti MRN: [id number] Date of Birth: 01/05/1951

## 2017-07-08 ENCOUNTER — Encounter

## 2017-07-08 ENCOUNTER — Ambulatory Visit: Payer: Medicare Other | Admitting: Podiatry

## 2017-07-08 DIAGNOSIS — M79675 Pain in left toe(s): Secondary | ICD-10-CM

## 2017-07-08 DIAGNOSIS — L84 Corns and callosities: Secondary | ICD-10-CM

## 2017-07-08 DIAGNOSIS — M79674 Pain in right toe(s): Secondary | ICD-10-CM

## 2017-07-08 DIAGNOSIS — B351 Tinea unguium: Secondary | ICD-10-CM

## 2017-07-09 ENCOUNTER — Encounter: Payer: Self-pay | Admitting: Podiatry

## 2017-07-09 NOTE — Progress Notes (Addendum)
Subjective:  Judy Spears" presents today with her husband and caregiver, who give most of the history on today. Husband states concern is for painful right 4th toe. Duration greater than a few weeks. Husband relates toe curves and  has become painful. He decided to seek professional care due to his wife experiencing pain. Chart reviewed today: Past Medical History:  Cardiovascular and Mediastinum  Premature atrial contractions   Benign essential HTN   Reactive hypertension   Respiratory  Allergic rhinitis   Tracheal stenosis   Chronic respiratory failure with hypoxia (HCC)   Acute tracheobronchitis   Chronic respiratory failure (HCC)   Hypoxia   Pneumonia of both lower lobes due to Pseudomonas species (HCC)   Pneumonia of both lower lobes due to methicillin susceptible Staphylococcus aureus (MSSA) (HCC)   Acute respiratory failure with hypoxia (HCC)   Atelectasis   Acute hypoxemic respiratory failure (HCC)   Acute on chronic respiratory failure with hypoxia (HCC)   Respiratory insufficiency   Respiratory failure (HCC)   Tracheobronchitis   Pneumonia   Acute respiratory failure (HCC)   Chronic mucus hypersecretion, respiratory   Nervous and Auditory  Encephalitis, arthropod-borne viral   Movement disorder   Encephalitis   Spastic tetraplegia (HCC)   Acute encephalopathy   Musculoskeletal and Integument  Bunion   Pressure injury of skin   Genitourinary  Urinary retention   Pseudomonas urinary tract infection   Acute urinary retention   Other  Metatarsalgia of both feet   FLAT FOOT   HALLUX RIGIDUS, ACQUIRED   Hyperlipidemia   Cardiovascular risk factor   Abnormality of gait   Chest pain   Tracheostomy status (HCC)   Tracheostomy tube present (HCC)   Sepsis (HCC)   Pain   Seizures (HCC)   Debility   Acute blood loss anemia   Heterotopic ossification of bone   Malignant neoplasm of upper-inner quadrant of left breast in female, estrogen receptor positive (HCC)    PEG (percutaneous endoscopic gastrostomy) status (HCC)   Hyponatremia   Hypokalemia   Severe muscle deconditioning   Respiratory distress   Shortness of breath   Severe protein-calorie malnutrition [name]: less than 60% of standard weight) (HCC)   Constipation   Abdominal distension   Coagulase negative Staphylococcus bacteremia    Past Surgical History: 01/30/2017 Intramedullary (im) nail intertrochanteric (Left)   12/15/2016 Tracheostomy tube placement (N/A)   09/29/2016 Ir replc gastro/colonic tube percut w/fluoro  07/07/2016 Breast biopsy (Right)  06/30/2016 Breast biopsy (Left)  07/27/2012 Breast biopsy (Right)  01/01/2011 Breast biopsy   2000 Hernia repair   1986 Cesarean section  1983 Rhinoplasty  Date Unknown cataracts right eye [Other]  Date Unknown opirectinal membrane peel [Other]  Date Unknown Peg placement  Date Unknown Tracheostomy   Medications:  albuterol (PROVENTIL) (2.5 MG/3ML) 0.083% nebulizer solution    amantadine (SYMMETREL) 50 MG/5ML solution    Amino Acids-Protein Hydrolys (FEEDING SUPPLEMENT, PRO-STAT SUGAR FREE 64,) LIQD    aspirin EC 81 MG tablet    atorvastatin (LIPITOR) 20 MG tablet    baclofen (LIORESAL) 10 MG tablet    bromocriptine (PARLODEL) 2.5 MG tablet    Calcium Carb-Cholecalciferol (CALCIUM CARBONATE-VITAMIN D3 PO)    carbidopa-levodopa (SINEMET IR) 25-100 MG tablet    chlorhexidine (PERIDEX) 0.12 % solution    Cholecalciferol (VITAMIN D) 2000 units CAPS    Coenzyme Q10 (COQ10 PO)    docusate sodium (COLACE) 100 MG capsule    ferrous sulfate 325 (65 FE) MG tablet    fluticasone (  FLONASE) 50 MCG/ACT nasal spray    gabapentin (NEURONTIN) 100 MG capsule    guaiFENesin (ROBITUSSIN) 100 MG/5ML SOLN    ibuprofen (ADVIL,MOTRIN) 100 MG/5ML suspension    ketoconazole (NIZORAL) 2 % cream    levofloxacin (LEVAQUIN) 750 MG tablet    loratadine (CLARITIN) 10 MG tablet    Melatonin 5 MG CAPS    Multiple Vitamin (MULTIVITAMIN) LIQD    Nutritional  Supplements (FEEDING SUPPLEMENT, OSMOLITE 1.5 CAL,) LIQD    polyethylene glycol (MIRALAX / GLYCOLAX) packet    potassium chloride 20 MEQ/15ML (10%) SOLN    primidone (MYSOLINE) 50 MG tablet    Probiotic Product (PROBIOTIC-10 PO)    Propylene Glycol (SYSTANE BALANCE) 0.6 % SOLN    ranitidine (ZANTAC) 75 MG tablet    sodium chloride HYPERTONIC 3 % nebulizer solution    valACYclovir (VALTREX) 500 MG tablet    vitamin C (ASCORBIC ACID) 500 MG tablet    Water For Irrigation, Sterile (FREE WATER) SOLN         Allergies: CefepimeOther (See Comments)  Tetracyclines & RelatedPhotosensitivity, Other (See Comments)  Doxycycline MonohydrateOther (See Comments)  Bee PollenOther (See Comments)  Pollen ExtractOther (See Comments)   Family History: Mother (Deceased at age 33) Stroke     Hypertension          Father Colon cancer    Prostate cancer         Brother         Brother         Daughter         Daughter         Son         Maternal Grandfather Heart attack (6 y)        Maternal Uncle Stroke (76 y)   Social History: Smoking Status  Never Smoker  Smokeless Tobacco Status  Never Used   Married: lives with husband; has caregiver  ROS: HEENT: dysphasia Respiratory: tracheostomy tube MS: painful right 4th toe  Objective: Vascular:  DP/PT pulses palpable b/l Capillary refill time immediate x 10 No digital hair b/l No edema b/l LE  Dermatological: Skin with normal turgor, texture and tone Toenails 1-5 b/l elongated, discolored, dystrophic with subungual debris x 10 Right 4th toenail also noted to be onycholytic to level of proximal nail fold with evidence of old subungual seroma. 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 Painful onychomycosis b/l  2. Painful corn right 4th toe 3. Onycholysis right 4th toe  Plan: 1. Discussed  onychomycosis and topical treatment options with her husband. He would like to defer new prescription for now. He will use OTC medication he has at home. 2. Toenails debrided in length and girth 1-5 b/l. Right 4th digit nailplate removed in toto due to looseness. Nailbed cleansed with alcohol. TAO applied to digit.  3. Corn debrided right 4th toe; dispensed silicone toe cap with instructions for use to minimize corn formation on this digit.  Instructions:  Apply to right 4th toe every morning and remove before bedtime. 4. Follow up 3 months. 5. Husband to call should there be a concern in the interim.

## 2017-07-12 ENCOUNTER — Ambulatory Visit: Payer: Medicare Other

## 2017-07-12 ENCOUNTER — Ambulatory Visit: Payer: Medicare Other | Admitting: Physical Therapy

## 2017-07-12 DIAGNOSIS — R41841 Cognitive communication deficit: Secondary | ICD-10-CM

## 2017-07-12 DIAGNOSIS — R4701 Aphasia: Secondary | ICD-10-CM

## 2017-07-12 DIAGNOSIS — R471 Dysarthria and anarthria: Secondary | ICD-10-CM | POA: Diagnosis not present

## 2017-07-12 DIAGNOSIS — R2681 Unsteadiness on feet: Secondary | ICD-10-CM

## 2017-07-12 DIAGNOSIS — R2689 Other abnormalities of gait and mobility: Secondary | ICD-10-CM

## 2017-07-12 DIAGNOSIS — R1312 Dysphagia, oropharyngeal phase: Secondary | ICD-10-CM

## 2017-07-12 NOTE — Therapy (Signed)
[facility] [address] [place], [place], [address] Phone: [phone]   Fax:  [phone]  Speech Language Pathology Treatment  Patient Details  Name: Judy Spears MRN: [id number] Date of Birth: 1950-05-27 Referring Provider: Levada Schilling, MD   Encounter Date: 07/12/2017  End of Session - 07/12/17 1747    Visit Number  16    Number of Visits  17    Date for SLP Re-Evaluation  07/22/17    SLP Start Time  1120    SLP Stop Time   1147    SLP Time Calculation (min)  27 min    Activity Tolerance  Patient limited by lethargy last 15 minutes of session       Past Medical History:  Diagnosis Date  . Arthritis    lt great toe  . Complication of anesthesia    pt has had headaches post op-did advise to hydra well preop  . Hair loss    right-sided  . History of exercise stress test    a. ETT (12/15) with 12:00 exercise, no ST changes, occasional PACs.   . Hyperlipidemia   . Insomnia   . Migraine headache   . Movement disorder    resltess in left legs  . Postmenopausal   . 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 Premature atrial contractions    a. Holter (12/15) with 8% PACs, no atrial runs or atrial fibrillation.     Past Surgical History:  Procedure Laterality Date  . BREAST BIOPSY  01/01/2011   Procedure: BREAST BIOPSY WITH NEEDLE LOCALIZATION;  Surgeon: Rolm Bookbinder, MD;  Location: [facility];  Service: General;  Laterality: Left;  left breast wire localization biopsy  . BREAST BIOPSY Right 07/27/2012  . BREAST BIOPSY Right 07/07/2016  . BREAST BIOPSY Left 06/30/2016  . cataracts right eye    . CESAREAN SECTION  1986  . HERNIA REPAIR  2000   [facility]  . INTRAMEDULLARY (IM) NAIL INTERTROCHANTERIC Left 01/30/2017   Procedure: ORIF AFFIXUS NAIL;  Surgeon: Paralee Cancel, MD;  Location: [facility];  Service: Orthopedics;  Laterality: Left;  . IR REPLC GASTRO/COLONIC TUBE PERCUT W/FLUORO  09/29/2016  .  opirectinal membrane peel    . PEG PLACEMENT    . RHINOPLASTY  1983  . TRACHEOSTOMY    . TRACHEOSTOMY TUBE PLACEMENT N/A 12/15/2016   Procedure: TRACHEOSTOMY CHANGE;  Surgeon: [name] Montane, MD;  Location: [facility];  Service: ENT;  Laterality: N/A;    There were no vitals filed for this visit.  Subjective Assessment - 07/12/17 1732    Subjective  SLP again used parallel bars today to keep pt alert/aroused. Pt in restroom until 1119.    Patient is accompained by:  Family member [name] (husband), [name] (aide)    Currently in Pain?  No/denies            ADULT SLP TREATMENT - 07/12/17 1733      General Information   Behavior/Cognition  Alert;Pleasant mood;Distractible;Requires cueing      Treatment Provided   Treatment provided  Cognitive-Linquistic      Cognitive-Linquistic Treatment   Treatment focused on  Dysarthria    Skilled Treatment  Pt remained aroused for approx 85% of session, req'd tactile cues for increasing alertness in the last 15 minutes of session. Pt frequency of voice again more successful when pt was in sitting position than when in standing. Frequency of functional voice level and of functional intelligibility were as follows today: IMITATION:  Voice at functional level; 1-syllable 100% (4/4), 2-syllable- 50% (3/6), 3 syllable 50% (1/2). Intelligibility; 1-syllable 75% (3/4), 2-syllable 33% (2/6), 3-syllable 0% (0/2). SPONTANEOUS: Voice at functional level; 1-syllable 4/4 (100%), 2-syllable 33% (1/3), 3 syllable 33% (1/3), 4 syllable 100% (1/1). Functionally Intelligible: 1-syllable 50% (2/4), 2-syllable 33% (1/3), 3 syllable 33% (1/3), 4 syllable 100% (1/1). SLP did not understand 2 utterances that aide/family did. SLP suggested pt decr frequency to x1/week x4 weeks for SLP to act more in a consulting role, and that aide and husband cont with doing ST throughout the day as pt alertness and attention/interest allows. Husband stated, "I totally agree."       Assessment /  Recommendations / [place] with current plan of care      Progression Toward Goals   Progression toward goals  Progressing toward goals goals to change in sessions after 07-18-17;       SLP Education - 07/12/17 1746    Education provided  Yes    Education Details  reduce to x1/week x4 weeks beginning week 07-18-17 for transition to "consultant" role with aide/husband performing effective ST throughout the day with pt    Person(s) Educated  Patient;Caregiver(s);Spouse    Methods  Explanation    Comprehension  Verbalized understanding       SLP Short Term Goals - 06/27/17 1750      SLP SHORT TERM GOAL #1   Title  Pt will demonstrate dysarthria HEP to increase articulatory coordination for speech with usual mod A over 3 sessions    Status  Deferred      SLP SHORT TERM GOAL #2   Title  Pt will achieve phonation adequate for SLP intelligibility in imitated two-syllable words using intelligibility compensations 12/20 trials with usual min A in 3 sessions    Status  Not Met      SLP SHORT TERM GOAL #3   Title  Pt will imitate 2-syllable words or phrases with functional intelligibility for SLP in 5/15 trials over 3 sessions     Status  Not Met      SLP SHORT TERM GOAL #4   Title  Pt will sustain attention to therapy tasks for 10 minutes with multimodal cues in 5 sessions    Status  Partially Met      SLP SHORT TERM GOAL #5   Title  pt family will demo understanding of oral care regimen, including safe ingestion of water/ice chips over three sessions    Status  Achieved      SLP SHORT TERM GOAL #6   Title  pt will demo dysphagia HEP to improve management of secretions and safety with any possible POs    Status  Deferred      SLP SHORT TERM GOAL #7   Title  family/caregivers will demo understanding of pt swallowing precautions, for pt safety with POs, by verbal report     Status  Achieved       SLP Long Term Goals - 07/12/17 1749      SLP LONG TERM GOAL #1   Title   Pt will demo dysarthria  HEP to incr articulatory coordination for speech, with occasional mod A over three sessions    Time  1    Period  Weeks    Status  On-going      SLP LONG TERM GOAL #2   Title  pt will achieve phonation adequate for SLP intelligibility in imitated 3-syllable words/3-word phrases using intelligibility  compenastions 15/20 trials    Time  1    Period  Weeks    Status  On-going      SLP LONG TERM GOAL #3   Title  Pt will imitate 2-syllable words or phrases with functional intelligibility for SLP in 8/15 trials over three sessions    Baseline  07-04-17    Time  1    Period  Weeks    Status  On-going      SLP LONG TERM GOAL #4   Title  pt will sustain attention to therapy tasks for 10 minutes with multimodal cues in 10 sessions    Baseline  06-10-17, 07-04-17, 07-06-17; 07-12-17    Time  1    Period  Weeks    Status  On-going      SLP LONG TERM GOAL #5   Title  pt will demo dysphagia HEP to improve management of secretions and safety with possible POs    Period  Weeks    Status  Deferred       Plan - 07/12/17 1747    Clinical Impression Statement  Patient presents with ongoing deficits in speech intelligibility (dysarthria, reduced breath support), cognitive communication and swallowing. SLP to transition to more of a consultant role for goals to be written for sessions beginning on/after 07-18-17, with pt decr'ing frequency to x1/week x4 weeks. Husband completely in agreement with this plan. See skilled treatment for details of voicing, functional speech intelligilibty for imitation and spontaneous production of 1-4 syllable words/phrases.     Speech Therapy Frequency  2x / week    Duration  -- 8 weeks/17 sessions    Treatment/Interventions  Aspiration precaution training;Trials of upgraded texture/liquids;Compensatory strategies;Functional tasks;Patient/family education;Cueing hierarchy;Diet toleration management by SLP;Cognitive reorganization;Compensatory  techniques;SLP instruction and feedback;Internal/external aids;Multimodal communcation approach;Pharyngeal strengthening exercises;Oral motor exercises    Potential to Achieve Goals  Fair    Potential Considerations  Ability to learn/carryover information;Co-morbidities;Severity of impairments;Cooperation/participation level;Previous level of function       Patient will benefit from skilled therapeutic intervention in order to improve the following deficits and impairments:   Dysarthria and anarthria  Cognitive communication deficit  Dysphagia, oropharyngeal phase  Aphasia    Problem List Patient Active Problem List   Diagnosis Date Noted  . Coagulase negative Staphylococcus bacteremia 02/09/2017  . Abdominal distension   . Chronic mucus hypersecretion, respiratory 12/31/2016  . Severe protein-calorie malnutrition [name]: less than 60% of standard weight) ([place]) 12/31/2016  . Acute urinary retention 12/31/2016  . Constipation 12/31/2016  . Shortness of breath   . Respiratory distress   . Pneumonia 12/05/2016  . Acute respiratory failure ([place]) 12/05/2016  . Pseudomonas urinary tract infection 10/27/2016  . Severe muscle deconditioning 09/30/2016  . Pressure injury of skin 09/28/2016  . Respiratory failure ([place])   . Tracheobronchitis   . Urinary retention   . PEG (percutaneous endoscopic gastrostomy) status ([place])   . Benign essential HTN   . Hyponatremia   . Hypokalemia   . Reactive hypertension   . Respiratory insufficiency 09/24/2016  . Malignant neoplasm of upper-inner quadrant of left breast in female, estrogen receptor positive ([place]) 07/14/2016  . Heterotopic ossification of bone 03/10/2016  . Acute on chronic respiratory failure with hypoxia ([place])   . Acute hypoxemic respiratory failure ([place])   . Acute blood loss anemia   . Atelectasis 02/04/2016  . Debility 02/03/2016  . Pneumonia of both lower lobes due to Pseudomonas species ([place])   . Pneumonia of both lower  lobes due to methicillin susceptible Staphylococcus aureus (MSSA) ([place])   . Acute respiratory failure with hypoxia ([place])   . Acute encephalopathy   . Seizures ([place])   . Sepsis ([place])   . Hypoxia   . Pain   . Chronic respiratory failure ([place]) 12/28/2015  . Acute tracheobronchitis 12/24/2015  . Tracheostomy tube present ([place])   . Tracheal stenosis   . Chronic respiratory failure with hypoxia ([place])   . Spastic tetraplegia ([place]) 10/20/2015  . Tracheostomy status ([place]) 09/26/2015  . Allergic rhinitis 09/26/2015  . 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 Movement disorder 09/22/2015  . Encephalitis 09/22/2015  . Chest pain 02/07/2014  . Premature atrial contractions 01/13/2014  . Abnormality of gait 12/04/2013  . Hyperlipidemia 12/21/2011  . Cardiovascular risk factor 12/21/2011  . Bunion 01/31/2008  . Metatarsalgia of both feet 09/20/2007  . FLAT FOOT 09/20/2007  . HALLUX RIGIDUS, ACQUIRED 09/20/2007    [facility] ,MS, CCC-SLP  07/12/2017, 5:50 PM  [facility] [address] [place], [place], [address] Phone: [phone]   Fax:  [phone]   Name: Judy Spears MRN: [id number] Date of Birth: 06/28/1950

## 2017-07-13 ENCOUNTER — Encounter: Payer: Self-pay | Admitting: Physical Therapy

## 2017-07-13 NOTE — Therapy (Signed)
[facility] [address] [place], [place], [address] Phone: [phone]   Fax:  [phone]  Physical Therapy Treatment  Patient Details  Name: Judy Spears MRN: [id number] Date of Birth: Sep 01, 1950 Referring Provider: Dr. Levada Schilling   Encounter Date: 07/12/2017  PT End of Session - 07/13/17 1710    Visit Number  16    Number of Visits  17    Date for PT Re-Evaluation  07/09/17    Authorization Type  [employer] Medicare    Authorization Time Period  05-10-17 - 08-08-17    PT Start Time  1017    PT Stop Time  1102    PT Time Calculation (min)  45 min       Past Medical History:  Diagnosis Date  . Arthritis    lt great toe  . Complication of anesthesia    pt has had headaches post op-did advise to hydra well preop  . Hair loss    right-sided  . History of exercise stress test    a. ETT (12/15) with 12:00 exercise, no ST changes, occasional PACs.   . Hyperlipidemia   . Insomnia   . Migraine headache   . Movement disorder    resltess in left legs  . Postmenopausal   . 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 Premature atrial contractions    a. Holter (12/15) with 8% PACs, no atrial runs or atrial fibrillation.     Past Surgical History:  Procedure Laterality Date  . BREAST BIOPSY  01/01/2011   Procedure: BREAST BIOPSY WITH NEEDLE LOCALIZATION;  Surgeon: Rolm Bookbinder, MD;  Location: [facility];  Service: General;  Laterality: Left;  left breast wire localization biopsy  . BREAST BIOPSY Right 07/27/2012  . BREAST BIOPSY Right 07/07/2016  . BREAST BIOPSY Left 06/30/2016  . cataracts right eye    . CESAREAN SECTION  1986  . HERNIA REPAIR  2000   RIH  . INTRAMEDULLARY (IM) NAIL INTERTROCHANTERIC Left 01/30/2017   Procedure: ORIF AFFIXUS NAIL;  Surgeon: Paralee Cancel, MD;  Location: [facility];  Service: Orthopedics;  Laterality: Left;  . IR REPLC GASTRO/COLONIC TUBE PERCUT W/FLUORO  09/29/2016  .  opirectinal membrane peel    . PEG PLACEMENT    . RHINOPLASTY  1983  . TRACHEOSTOMY    . TRACHEOSTOMY TUBE PLACEMENT N/A 12/15/2016   Procedure: TRACHEOSTOMY CHANGE;  Surgeon: [name] Montane, MD;  Location: [facility];  Service: ENT;  Laterality: N/A;    There were no vitals filed for this visit.  Subjective Assessment - 07/13/17 1656    Subjective  No new complaints reported - no changes since previous treatment session    Patient is accompained by:  Family member    Pertinent History  [place] viral encephalitis; anoxic BI:  ORIF Lt hip 02-09-17;  Rt THR  04-12-17    Patient Stated Goals  improve mobility; walk    Currently in Pain?  No/denies                       [facility] Adult PT Treatment/Exercise - 07/13/17 0001      Transfers   Transfers  Sit to Stand;Stand to Sit    Sit to Stand  3: Mod assist    Sit to Stand Details  Tactile cues for sequencing;Tactile cues for posture;Tactile cues for weight shifting;Verbal cues for sequencing;Verbal cues for technique;Manual facilitation for weight shifting;Manual facilitation for placement;Manual facilitation for weight bearing  Stand to Sit  3: Mod assist    Stand to Sit Details (indicate cue type and reason)  Tactile cues for sequencing;Tactile cues for weight shifting;Tactile cues for posture;Tactile cues for placement;Verbal cues for sequencing;Verbal cues for technique;Manual facilitation for weight shifting;Manual facilitation for placement;Manual facilitation for weight bearing    Stand to Sit Details  cues to reach back with RUE support to wheelchair     Stand Pivot Transfers  3: Mod assist      Ambulation/Gait   Ambulation/Gait  Yes    Ambulation/Gait Assistance  2: Max assist +2 needed for upright posture    Ambulation/Gait Assistance Details  max of 2 people needed; Antony Salmon, OT assisted pt on her Rt side and myself (PT) was on pt's left side with assist to keep Lt shoulder/trunk extended to prevent forward flexion and  my Rt hand was placed on pt's Rt hip to assist with adduction and alignment for turnk ; cues given to shift/translate weight anteriorly prior to steppig with opposite leg; cues to "bring belly forward" for forward weight translattion;  Blue rocker AFO with 2 small heel wedges used on RLE, Toe off AFO used on LLE ; blue shoe covers used to assist with advancement of LE in swing    Ambulation Distance (Feet)  35 Feet 30    Assistive device  2 person hand held assist    Gait Pattern  Step-to pattern;Step-through pattern;Decreased stride length;Decreased dorsiflexion - right;Decreased dorsiflexion - left;Decreased weight shift to left;Ataxic;Trunk flexed;Narrow base of support;Poor foot clearance - left    Ambulation Surface  Level;Indoor      Neuro Re-ed    Neuro Re-ed Details   Pt stood inside // bars with RUE support; mirror used for visual feedback; cues to tilt head toward Rt side to facilitate more erect posture; weight shift laterally x 10 reps       Caregiver ([name]) reported she had just given pt her meds prior to start of PT - so pt was not positioned in supine for stretches - session Started with standing in // bars followed by gait training due to medication schedule        PT Short Term Goals - 06/10/17 1305      PT SHORT TERM GOAL #1   Title  Perform sit to/from stand from wheelchair or mat with +1 mod assist.    Baseline  06/09/16: met today    Status  Achieved      PT SHORT TERM GOAL #2   Title  Pt will perform sit to stand from wheelchair to RW with max assist for pre-gait skill.    Baseline  06/09/17: met today    Status  Achieved      PT SHORT TERM GOAL #3   Title  Pt will transfer supine to sit with max assist.    Baseline  06/09/17: met today, needs increased time to assist    Status  Achieved      PT SHORT TERM GOAL #4   Title  Pt will sit with RUE support with min to mod assist unsupported on side of mat for at least 3" to demo improved trunk strength and core  stabilization.    Baseline  06/09/17: met today     Status  Achieved      PT SHORT TERM GOAL #5   Title  Pt will amb. 15' with appropriate assistive device with max assist +1    Baseline  06/09/17: pt now needs mod/max  assist of 2 people for gait using modified 3 muskateer assist and blue rocker AFO to right LE    Status  Partially Met        PT Long Term Goals - 07/13/17 1711      PT LONG TERM GOAL #1   Title  Pt will perform supine rolling R and L with min assist  to indicate improved independence with bed mobility.      Status  New      PT LONG TERM GOAL #2   Title  Pt will sit on side of mat with RUE support for at least 5" with SBA     Status  New      PT LONG TERM GOAL #3   Title  Pt will amb. 20' with appropriate assistive device with +1 max assist.    Baseline  pt requires +2 max to mod assist at this time - no device used - 07-12-17    Status  Not Met      PT LONG TERM GOAL #4   Title  Pt will perform basic transfers with min to mod assist to indicate improved functional independence.      Baseline  max assist needed on 07-12-17    Status  On-going    Target Date  09/11/17      PT LONG TERM GOAL #5   Title  Pt will perform static standing x 3" with bil. UE support with mod assist.      Status  New      PT LONG TERM GOAL #6   Title  Pt will be able to return to participation in therapeutic horseback riding at [facility] for ongoing exercise program.    Baseline  Not returned as of 07-12-17 - pt has had bil. hip surgeries    Status  On-going    Target Date  09/11/17            Plan - 07/13/17 1718    Clinical Impression Statement  Pt is progressing with gait, however, progress is slow performance varies due to pt's alertness. Pt is able to correct deviations with cues.  Pt has not met LTG's #3 or 4 due to pt's dependency with gait and mobility, as pt requires +2 max to mod assist with gait and max assist with transfers.      Rehab Potential  Fair    PT Frequency   2x / week    PT Duration  8 weeks    PT Treatment/Interventions  ADLs/Self Care Home Management;Therapeutic exercise;Therapeutic activities;Functional mobility training;Gait training;Balance training;Neuromuscular re-education;Patient/family education;Orthotic Fit/Training;Passive range of motion    PT Next Visit Plan  check LTG's #1, 2 and 5; cont with LE stretching, gait training and activities to facilitate trunk flexion; I plan to renew 1x/week x 12 weeks for maintainence program for PT    PT Home Exercise Plan  caregiver and husband have previously been instructed in ROM, sitting balance, and transfer training    Consulted and Agree with Plan of Care  Patient       Patient will benefit from skilled therapeutic intervention in order to improve the following deficits and impairments:  Abnormal gait, Decreased activity tolerance, Decreased balance, Decreased cognition, Decreased coordination, Decreased mobility, Decreased range of motion, Decreased endurance, Hypomobility, Decreased strength, Impaired flexibility, Increased muscle spasms, Impaired UE functional use, Postural dysfunction, Pain  Visit Diagnosis: Other abnormalities of gait and mobility  Unsteadiness on feet     Problem List Patient Active  Problem List   Diagnosis Date Noted  . Coagulase negative Staphylococcus bacteremia 02/09/2017  . Abdominal distension   . Chronic mucus hypersecretion, respiratory 12/31/2016  . Severe protein-calorie malnutrition [name]: less than 60% of standard weight) ([place]) 12/31/2016  . Acute urinary retention 12/31/2016  . Constipation 12/31/2016  . Shortness of breath   . Respiratory distress   . Pneumonia 12/05/2016  . Acute respiratory failure ([place]) 12/05/2016  . Pseudomonas urinary tract infection 10/27/2016  . Severe muscle deconditioning 09/30/2016  . Pressure injury of skin 09/28/2016  . Respiratory failure ([place])   . Tracheobronchitis   . Urinary retention   . PEG (percutaneous  endoscopic gastrostomy) status ([place])   . Benign essential HTN   . Hyponatremia   . Hypokalemia   . Reactive hypertension   . Respiratory insufficiency 09/24/2016  . Malignant neoplasm of upper-inner quadrant of left breast in female, estrogen receptor positive ([place]) 07/14/2016  . Heterotopic ossification of bone 03/10/2016  . Acute on chronic respiratory failure with hypoxia ([place])   . Acute hypoxemic respiratory failure ([place])   . Acute blood loss anemia   . Atelectasis 02/04/2016  . Debility 02/03/2016  . Pneumonia of both lower lobes due to Pseudomonas species ([place])   . Pneumonia of both lower lobes due to methicillin susceptible Staphylococcus aureus (MSSA) ([place])   . Acute respiratory failure with hypoxia ([place])   . Acute encephalopathy   . Seizures ([place])   . Sepsis ([place])   . Hypoxia   . Pain   . Chronic respiratory failure ([place]) 12/28/2015  . Acute tracheobronchitis 12/24/2015  . Tracheostomy tube present ([place])   . Tracheal stenosis   . Chronic respiratory failure with hypoxia ([place])   . Spastic tetraplegia ([place]) 10/20/2015  . Tracheostomy status ([place]) 09/26/2015  . Allergic rhinitis 09/26/2015  . 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 Movement disorder 09/22/2015  . Encephalitis 09/22/2015  . Chest pain 02/07/2014  . Premature atrial contractions 01/13/2014  . Abnormality of gait 12/04/2013  . Hyperlipidemia 12/21/2011  . Cardiovascular risk factor 12/21/2011  . Bunion 01/31/2008  . Metatarsalgia of both feet 09/20/2007  . FLAT FOOT 09/20/2007  . HALLUX RIGIDUS, ACQUIRED 09/20/2007    Alda Lea, PT 07/13/2017, 5:29 PM  [facility] [address] [place], [place], [address] Phone: [phone]   Fax:  [phone]  Name: Judy Spears MRN: [id number] Date of Birth: February 14, 1950

## 2017-07-14 ENCOUNTER — Ambulatory Visit: Payer: Medicare Other | Admitting: Rehabilitation

## 2017-07-14 ENCOUNTER — Encounter: Payer: Self-pay | Admitting: Rehabilitation

## 2017-07-14 ENCOUNTER — Ambulatory Visit: Payer: Medicare Other | Admitting: Speech Pathology

## 2017-07-14 ENCOUNTER — Encounter: Payer: Self-pay | Admitting: Occupational Therapy

## 2017-07-14 ENCOUNTER — Ambulatory Visit: Payer: Medicare Other | Admitting: Occupational Therapy

## 2017-07-14 DIAGNOSIS — R471 Dysarthria and anarthria: Secondary | ICD-10-CM | POA: Diagnosis not present

## 2017-07-14 DIAGNOSIS — R2689 Other abnormalities of gait and mobility: Secondary | ICD-10-CM

## 2017-07-14 DIAGNOSIS — M6281 Muscle weakness (generalized): Secondary | ICD-10-CM

## 2017-07-14 DIAGNOSIS — G8250 Quadriplegia, unspecified: Secondary | ICD-10-CM

## 2017-07-14 DIAGNOSIS — M25611 Stiffness of right shoulder, not elsewhere classified: Secondary | ICD-10-CM

## 2017-07-14 DIAGNOSIS — R293 Abnormal posture: Secondary | ICD-10-CM

## 2017-07-14 DIAGNOSIS — M25652 Stiffness of left hip, not elsewhere classified: Secondary | ICD-10-CM

## 2017-07-14 DIAGNOSIS — M25612 Stiffness of left shoulder, not elsewhere classified: Secondary | ICD-10-CM

## 2017-07-14 DIAGNOSIS — R29818 Other symptoms and signs involving the nervous system: Secondary | ICD-10-CM

## 2017-07-14 DIAGNOSIS — M25622 Stiffness of left elbow, not elsewhere classified: Secondary | ICD-10-CM

## 2017-07-14 DIAGNOSIS — R2681 Unsteadiness on feet: Secondary | ICD-10-CM

## 2017-07-14 DIAGNOSIS — M25651 Stiffness of right hip, not elsewhere classified: Secondary | ICD-10-CM

## 2017-07-14 DIAGNOSIS — R41844 Frontal lobe and executive function deficit: Secondary | ICD-10-CM

## 2017-07-14 NOTE — Therapy (Signed)
[facility] [address] [place], [place], [address] Phone: [phone]   Fax:  [phone]  Occupational Therapy Treatment  Patient Details  Name: Judy Spears MRN: [id number] Date of Birth: 13-Feb-1950 Referring Provider: Dr. Levada Spears   Encounter Date: 07/14/2017  OT End of Session - 07/14/17 1644    Visit Number  17    Number of Visits  40    Date for OT Re-Evaluation  08/03/17    Authorization Type  medicare maintenance program - pt to be reassessed every 30 days per Medicare guidelines    Authorization Time Period  90 days    OT Start Time  1146    OT Stop Time  1230    OT Time Calculation (min)  44 min    Activity Tolerance  Patient tolerated treatment well       Past Medical History:  Diagnosis Date  . Arthritis    lt great toe  . Complication of anesthesia    pt has had headaches post op-did advise to hydra well preop  . Hair loss    right-sided  . History of exercise stress test    a. ETT (12/15) with 12:00 exercise, no ST changes, occasional PACs.   . Hyperlipidemia   . Insomnia   . Migraine headache   . Movement disorder    resltess in left legs  . Postmenopausal   . 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 Premature atrial contractions    a. Holter (12/15) with 8% PACs, no atrial runs or atrial fibrillation.     Past Surgical History:  Procedure Laterality Date  . BREAST BIOPSY  01/01/2011   Procedure: BREAST BIOPSY WITH NEEDLE LOCALIZATION;  Surgeon: Judy Bookbinder, MD;  Location: [facility];  Service: General;  Laterality: Left;  left breast wire localization biopsy  . BREAST BIOPSY Right 07/27/2012  . BREAST BIOPSY Right 07/07/2016  . BREAST BIOPSY Left 06/30/2016  . cataracts right eye    . CESAREAN SECTION  1986  . HERNIA REPAIR  2000   RIH  . INTRAMEDULLARY (IM) NAIL INTERTROCHANTERIC Left 01/30/2017   Procedure: ORIF AFFIXUS NAIL;  Surgeon: Judy Cancel, MD;   Location: [facility];  Service: Orthopedics;  Laterality: Left;  . IR REPLC GASTRO/COLONIC TUBE PERCUT W/FLUORO  09/29/2016  . opirectinal membrane peel    . PEG PLACEMENT    . RHINOPLASTY  1983  . TRACHEOSTOMY    . TRACHEOSTOMY TUBE PLACEMENT N/A 12/15/2016   Procedure: TRACHEOSTOMY CHANGE;  Surgeon: Judy Montane, MD;  Location: [facility];  Service: ENT;  Laterality: N/A;    There were no vitals filed for this visit.  Subjective Assessment - 07/14/17 1423    Subjective   Hello there    Patient is accompained by:  Family member caregiver Judy Spears    Pertinent History  see epic snapshot - ABI/encephalitis/hypoxia from virus; recent hospitalization due to MRSA PNA     Patient Stated Goals  Pt unable to state.     Currently in Pain?  No/denies                   OT Treatments/Exercises (OP) - 07/14/17 0001      Neurological Re-education Exercises   Other Exercises 1  Therapeutic exercise with focus of session on training caregiver Judy Spears on upgraded stretching program.  Caregiver filmed all exercises. Caregiver is very familiar with pt however reported that at home they don't truly do a stretching program  with any set frequency.  Demonstrated all stretches and also discussed with caregiver and pt activities that pt can do with caregiver at home to maintain ROM. Pt currently has contractures of L elbow, both hips and ankles as well as L side of trunk.  Discussed that goal is to at least maintain ROM and decrease tone/stiffness as much as possible with consistent stretching program given impact that further contracture will have on pt's mobility, self care and potential skin breakdown. Caregiver verbalized understanding.  Will have caregiver return demonstrate next session and then caregiver will train other caregivers as well.               OT Education - 07/14/17 1432    Education provided  Yes    Education Details  home stretching program    Person(s) Educated  Patient;Caregiver(s)     Methods  Explanation;Demonstration;Other (comment) video - will provide hand out next session   video - will provide hand out next session   Comprehension  Verbalized understanding;Need further instruction will have caregiver return demonstrate next session   will have caregiver return demonstrate next session      OT Short Term Goals - 07/14/17 1433      OT [place] #1   Title  --    Status  --      OT SHORT TERM GOAL #2   Title  --    Status  --      OT SHORT TERM GOAL #6   Title  --      OT SHORT TERM GOAL #7   Title  --        OT Long Term Goals - 07/14/17 [place] #1   Title  Pt will maintain ability to roll in bed during self care tasks with no more than max a x1 - reasssess 08/03/2017    Status  On-going      OT LONG TERM GOAL #2   Title  Pt will maintain the ability to use roll in shower chair with care giver assistance     Status  On-going      OT LONG TERM GOAL #3   Title  Pt will maintain ability to transfer to toilet with no more than max a x1 using Steady Assist - 10/06/2017    Status  --      OT LONG TERM GOAL #4   Title  Pt wil maintain ability for dressing at max a x1.     Status  On-going      OT LONG TERM GOAL #5   Title  Pt will maintain ability to stand with max a x1 to for clothing mgmt with toileting.     Status  On-going      OT LONG TERM GOAL #6   Title  Pt will maintain ability to participate in functional transfers, as well as stand to sit and sit to stand with no more than max a x1    Status  New      OT LONG TERM GOAL #7   Title  Pt will maintain functional mobility and trunk control to aid in her ability to clear sucretions and assist with phonation to express basic needs/wants    Status  New            Plan - 07/14/17 1642    Clinical Impression Statement  Pt able to actively participate in stretching program to be  eventually be completed by husband and caregivers after additional education.      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    Current Impairments/barriers affecting progress:  severity of multi system deficits, intermittent medical issues    OT Frequency  2x / week    OT Duration  12 weeks (goals to be reassessed every 30 days)   OT Treatment/Interventions  Self-care/ADL training;Ultrasound;Moist Heat;Electrical Stimulation;DME and/or AE instruction;Neuromuscular education;Therapeutic exercise;Functional Mobility Training;Manual Therapy;Passive range of motion;Splinting;Therapeutic activities;Balance training;Patient/family education;Visual/perceptual remediation/compensation;Cognitive remediation/compensation    Plan   NMR trunk and LE's for transitional movements, static standing, sitting balance, sit to stand, stand to sit, stepping; address tone mgmt    Consulted and Agree with Plan of Care  Patient;Family member/caregiver    Family Member Consulted  caregiver Judy Spears       Patient will benefit from skilled therapeutic intervention in order to improve the following deficits and impairments:  Decreased endurance, Decreased skin integrity, Impaired vision/preception, Improper body mechanics, Impaired perceived functional ability, Decreased knowledge of precautions, Cardiopulmonary status limiting activity, Decreased activity tolerance, Decreased knowledge of use of DME, Decreased strength, Impaired flexibility, Improper spinal/pelvic alignment, Impaired sensation, Difficulty walking, Decreased mobility, Decreased balance, Decreased cognition, Decreased range of motion, Impaired  tone, Impaired UE functional use, Decreased safety awareness, Decreased coordination, Pain, Abnormal gait  Visit Diagnosis: Other abnormalities of gait and mobility  Unsteadiness on feet  Muscle weakness (generalized)  Abnormal posture  Other symptoms and signs involving the nervous system  Frontal lobe and executive function deficit  Stiffness of right shoulder, not elsewhere classified  Stiffness of left shoulder, not elsewhere classified  Stiffness of left elbow, not elsewhere classified  Spastic tetraplegia [facility])    Problem List Patient Active Problem List   Diagnosis Date Noted  . Coagulase negative Staphylococcus bacteremia 02/09/2017  . Abdominal distension   . Chronic mucus hypersecretion, respiratory 12/31/2016  . Severe protein-calorie malnutrition [name]: less than 60% of standard weight) ([place]) 12/31/2016  . Acute urinary retention 12/31/2016  . Constipation 12/31/2016  . Shortness of breath   . Respiratory distress   . Pneumonia 12/05/2016  . Acute respiratory failure ([place]) 12/05/2016  . Pseudomonas urinary tract infection 10/27/2016  . Severe muscle deconditioning 09/30/2016  . Pressure injury of skin 09/28/2016  . Respiratory failure ([place])   . Tracheobronchitis   . Urinary retention   . PEG (percutaneous endoscopic gastrostomy) status ([place])   . Benign essential HTN   . Hyponatremia   . Hypokalemia   . Reactive hypertension   . Respiratory insufficiency 09/24/2016  . Malignant neoplasm of upper-inner quadrant of left breast in female, estrogen receptor positive ([place]) 07/14/2016  . Heterotopic ossification of bone 03/10/2016  . Acute on chronic respiratory failure with hypoxia ([place])   . Acute hypoxemic respiratory failure ([place])   . Acute blood loss anemia   . Atelectasis 02/04/2016  . Debility 02/03/2016  . Pneumonia of both lower lobes due to Pseudomonas species ([place])   . Pneumonia of both lower lobes due to methicillin susceptible Staphylococcus  aureus (MSSA) ([place])   . Acute respiratory failure with hypoxia ([place])   . Acute encephalopathy   . Seizures ([place])   .  Sepsis ([place])   . Hypoxia   . Pain   . Chronic respiratory failure ([place]) 12/28/2015  . Acute tracheobronchitis 12/24/2015  . Tracheostomy tube present ([place])   . Tracheal stenosis   . Chronic respiratory failure with hypoxia ([place])   . Spastic tetraplegia ([place]) 10/20/2015  . Tracheostomy status ([place]) 09/26/2015  . Allergic rhinitis 09/26/2015  . 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 Movement disorder 09/22/2015  . Encephalitis 09/22/2015  . Chest pain 02/07/2014  . Premature atrial contractions 01/13/2014  . Abnormality of gait 12/04/2013  . Hyperlipidemia 12/21/2011  . Cardiovascular risk factor 12/21/2011  . Bunion 01/31/2008  . Metatarsalgia of both feet 09/20/2007  . FLAT FOOT 09/20/2007  . HALLUX RIGIDUS, ACQUIRED 09/20/2007    Quay Burow, OTR/L 07/14/2017, 4:50 PM  [facility] [address] [place], [place], [address] Phone: [phone]   Fax:  [phone]  Name: Judy Spears MRN: [id number] Date of Birth: 20-Aug-1950

## 2017-07-14 NOTE — Therapy (Signed)
[facility] [address] [place], [place], [address] Phone: [phone]   Fax:  [phone]  Speech Language Pathology Treatment  Patient Details  Name: Judy Spears MRN: [id number] Date of Birth: [birth year] Referring Provider: Levada Schilling, MD   Encounter Date: 07/14/2017  End of Session - 07/14/17 1255    Visit Number  17    Number of Visits  17    Date for SLP Re-Evaluation  07/22/17    SLP Start Time  42    SLP Stop Time   1100    SLP Time Calculation (min)  40 min    Activity Tolerance  Patient limited by lethargy last 15 minutes of session       Past Medical History:  Diagnosis Date  . Arthritis    lt great toe  . Complication of anesthesia    pt has had headaches post op-did advise to hydra well preop  . Hair loss    right-sided  . History of exercise stress test    a. ETT (12/15) with 12:00 exercise, no ST changes, occasional PACs.   . Hyperlipidemia   . Insomnia   . Migraine headache   . Movement disorder    resltess in left legs  . Postmenopausal   . 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 Premature atrial contractions    a. Holter (12/15) with 8% PACs, no atrial runs or atrial fibrillation.     Past Surgical History:  Procedure Laterality Date  . BREAST BIOPSY  01/01/2011   Procedure: BREAST BIOPSY WITH NEEDLE LOCALIZATION;  Surgeon: Rolm Bookbinder, MD;  Location: [facility];  Service: General;  Laterality: Left;  left breast wire localization biopsy  . BREAST BIOPSY Right 07/27/2012  . BREAST BIOPSY Right 07/07/2016  . BREAST BIOPSY Left 06/30/2016  . cataracts right eye    . CESAREAN SECTION  1986  . HERNIA REPAIR  2000   [facility]  . INTRAMEDULLARY (IM) NAIL INTERTROCHANTERIC Left 01/30/2017   Procedure: ORIF AFFIXUS NAIL;  Surgeon: Paralee Cancel, MD;  Location: [facility];  Service: Orthopedics;  Laterality: Left;  . IR REPLC GASTRO/COLONIC TUBE PERCUT W/FLUORO  09/29/2016  .  opirectinal membrane peel    . PEG PLACEMENT    . RHINOPLASTY  1983  . TRACHEOSTOMY    . TRACHEOSTOMY TUBE PLACEMENT N/A 12/15/2016   Procedure: TRACHEOSTOMY CHANGE;  Surgeon: [name] Montane, MD;  Location: [facility];  Service: ENT;  Laterality: N/A;    There were no vitals filed for this visit.  Subjective Assessment - 07/14/17 1022    Subjective  "Hi Judy Spears" (audible, intelligible to this SLP)    Patient is accompained by:  Family member Judy Spears    Currently in Pain?  No/denies            ADULT SLP TREATMENT - 07/14/17 1020      General Information   Behavior/Cognition  Alert;Cooperative;Pleasant mood    Patient Positioning  Upright in chair      Treatment Provided   Treatment provided  Cognitive-Linquistic      Pain Assessment   Pain Assessment  No/denies pain      Cognitive-Linquistic Treatment   Treatment focused on  Dysarthria    Skilled Treatment  Pt seen in ST treatment room for session; again she was aroused for 85% of session, requiring tactile cues for alertness in last 15 minutes of session. In sitting position, pt attaining more functional voicing than in prior session with  this SLP. Pt with audible, intelligible greeting, and comment: "Oh really?" Responded to questions with intelligible responses: "[place] went to the farm," and "All of the regulars." SLP worked with pt on 3 syllable words, primarily spontaneous vs imitation as this was more engaging for pt.  Frequency of functional voice level and of functional intelligibility were as follows today: IMITATION: Voice at functional level;  3 syllable 75% (3/4). Intelligibility; 3-syllable 50% (2/4). SPONTANEOUS: Voice at functional level; 3 syllable 50% (10/20), Functionally Intelligible: 3 syllable 65% (13/20). Judy Spears present and demo'd appropriate cuing, IDing when to cue pt for articulation vs rate vs vocal intensity.       Assessment / Recommendations / Plan   Plan  Continue with current plan of care      Progression  Toward Goals   Progression toward goals  Progressing toward goals         SLP Short Term Goals - 06/27/17 1750      SLP SHORT TERM GOAL #1   Title  Pt will demonstrate dysarthria HEP to increase articulatory coordination for speech with usual mod A over 3 sessions    Status  Deferred      SLP SHORT TERM GOAL #2   Title  Pt will achieve phonation adequate for SLP intelligibility in imitated two-syllable words using intelligibility compensations 12/20 trials with usual min A in 3 sessions    Status  Not Met      SLP SHORT TERM GOAL #3   Title  Pt will imitate 2-syllable words or phrases with functional intelligibility for SLP in 5/15 trials over 3 sessions     Status  Not Met      SLP SHORT TERM GOAL #4   Title  Pt will sustain attention to therapy tasks for 10 minutes with multimodal cues in 5 sessions    Status  Partially Met      SLP SHORT TERM GOAL #5   Title  pt family will demo understanding of oral care regimen, including safe ingestion of water/ice chips over three sessions    Status  Achieved      SLP SHORT TERM GOAL #6   Title  pt will demo dysphagia HEP to improve management of secretions and safety with any possible POs    Status  Deferred      SLP SHORT TERM GOAL #7   Title  family/caregivers will demo understanding of pt swallowing precautions, for pt safety with POs, by verbal report     Status  Achieved       SLP Long Term Goals - 07/14/17 1257      SLP LONG TERM GOAL #1   Title  Pt will demo dysarthria  HEP to incr articulatory coordination for speech, with occasional mod A over three sessions    Time  1    Period  Weeks    Status  On-going      SLP LONG TERM GOAL #2   Title  pt will achieve phonation adequate for SLP intelligibility in imitated 3-syllable words/3-word phrases using intelligibility compenastions 15/20 trials    Time  1    Period  Weeks    Status  On-going      SLP LONG TERM GOAL #3   Title  Pt will imitate 2-syllable words or  phrases with functional intelligibility for SLP in 8/15 trials over three sessions    Baseline  07-04-17    Time  1    Period  Weeks  Status  On-going      SLP LONG TERM GOAL #4   Title  pt will sustain attention to therapy tasks for 10 minutes with multimodal cues in 10 sessions    Baseline  06-10-17, 07-04-17, 07-06-17; 07-12-17, 07-14-17    Time  1    Period  Weeks    Status  On-going      SLP LONG TERM GOAL #5   Title  pt will demo dysphagia HEP to improve management of secretions and safety with possible POs    Status  Deferred       Plan - 07/14/17 1256    Clinical Impression Statement  Patient presents with ongoing deficits in speech intelligibility (dysarthria, reduced breath support), cognitive communication and swallowing. SLP to transition to more of a consultant role for goals to be written for sessions beginning on/after 07-18-17, with pt decr'ing frequency to x1/week x4 weeks. Judy Spears completely in agreement with this plan. See skilled treatment for details of voicing, functional speech intelligilibty for imitation and spontaneous production of 1-4 syllable words/phrases.     Speech Therapy Frequency  2x / week    Treatment/Interventions  Aspiration precaution training;Trials of upgraded texture/liquids;Compensatory strategies;Functional tasks;Patient/family education;Cueing hierarchy;Diet toleration management by SLP;Cognitive reorganization;Compensatory techniques;SLP instruction and feedback;Internal/external aids;Multimodal communcation approach;Pharyngeal strengthening exercises;Oral motor exercises    Potential to Achieve Goals  Fair    Potential Considerations  Ability to learn/carryover information;Co-morbidities;Severity of impairments;Cooperation/participation level;Previous level of function    Consulted and Agree with Plan of Care  Patient;Family member/caregiver    Family Member Consulted  Judy Spears, Judy Spears       Patient will benefit from skilled therapeutic  intervention in order to improve the following deficits and impairments:   Dysarthria and anarthria    Problem List Patient Active Problem List   Diagnosis Date Noted  . Coagulase negative Staphylococcus bacteremia 02/09/2017  . Abdominal distension   . Chronic mucus hypersecretion, respiratory 12/31/2016  . Severe protein-calorie malnutrition [name]: less than 60% of standard weight) ([facility]) 12/31/2016  . Acute urinary retention 12/31/2016  . Constipation 12/31/2016  . Shortness of breath   . Respiratory distress   . Pneumonia 12/05/2016  . Acute respiratory failure ([place]) 12/05/2016  . Pseudomonas urinary tract infection 10/27/2016  . Severe muscle deconditioning 09/30/2016  . Pressure injury of skin 09/28/2016  . Respiratory failure ([place])   . Tracheobronchitis   . Urinary retention   . PEG (percutaneous endoscopic gastrostomy) status ([place])   . Benign essential HTN   . Hyponatremia   . Hypokalemia   . Reactive hypertension   . Respiratory insufficiency 09/24/2016  . Malignant neoplasm of upper-inner quadrant of left breast in female, estrogen receptor positive ([place]) 07/14/2016  . Heterotopic ossification of bone 03/10/2016  . Acute on chronic respiratory failure with hypoxia ([place])   . Acute hypoxemic respiratory failure ([place])   . Acute blood loss anemia   . Atelectasis 02/04/2016  . Debility 02/03/2016  . Pneumonia of both lower lobes due to Pseudomonas species ([place])   . Pneumonia of both lower lobes due to methicillin susceptible Staphylococcus aureus (MSSA) ([facility])   . Acute respiratory failure with hypoxia ([place])   . Acute encephalopathy   . Seizures ([place])   . Sepsis ([place])   . Hypoxia   . Pain   . Chronic respiratory failure ([place]) 12/28/2015  . Acute tracheobronchitis 12/24/2015  . Tracheostomy tube present ([place])   . Tracheal stenosis   . Chronic respiratory failure with hypoxia ([place])   .  Spastic tetraplegia ([place]) 10/20/2015  . Tracheostomy status ([place]) 09/26/2015   . Allergic rhinitis 09/26/2015  . 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 Movement disorder 09/22/2015  . Encephalitis 09/22/2015  . Chest pain 02/07/2014  . Premature atrial contractions 01/13/2014  . Abnormality of gait 12/04/2013  . Hyperlipidemia 12/21/2011  . Cardiovascular risk factor 12/21/2011  . Bunion 01/31/2008  . Metatarsalgia of both feet 09/20/2007  . FLAT FOOT 09/20/2007  . HALLUX RIGIDUS, ACQUIRED 09/20/2007   Deneise Lever, [place], [place] Speech-Language Pathologist  Judy Spears 07/14/2017, 12:59 PM  [facility] [address] [place], [place], [address] Phone: [phone]   Fax:  [phone]   Name: Judy Spears MRN: [id number] Date of Birth: 06-08-1950

## 2017-07-14 NOTE — Therapy (Signed)
[facility] [address] [place], [place], [address] Phone: [phone]   Fax:  [phone]  Physical Therapy Treatment  Patient Details  Name: Judy Spears MRN: [id number] Date of Birth: August 06, 1950 Referring Provider: Dr. Levada Schilling   Encounter Date: 07/14/2017  PT End of Session - 07/14/17 1407    Visit Number  17    Number of Visits  28 per updated POC    Date for PT Re-Evaluation  10/04/17    Authorization Type  [employer] Medicare    Authorization Time Period  07/12/17-10/04/17    PT Start Time  1110 pt in restroom    PT Stop Time  1145    PT Time Calculation (min)  35 min       Past Medical History:  Diagnosis Date  . Arthritis    lt great toe  . Complication of anesthesia    pt has had headaches post op-did advise to hydra well preop  . Hair loss    right-sided  . History of exercise stress test    a. ETT (12/15) with 12:00 exercise, no ST changes, occasional PACs.   . Hyperlipidemia   . Insomnia   . Migraine headache   . Movement disorder    resltess in left legs  . Postmenopausal   . 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 Premature atrial contractions    a. Holter (12/15) with 8% PACs, no atrial runs or atrial fibrillation.     Past Surgical History:  Procedure Laterality Date  . BREAST BIOPSY  01/01/2011   Procedure: BREAST BIOPSY WITH NEEDLE LOCALIZATION;  Surgeon: Rolm Bookbinder, MD;  Location: [facility];  Service: General;  Laterality: Left;  left breast wire localization biopsy  . BREAST BIOPSY Right 07/27/2012  . BREAST BIOPSY Right 07/07/2016  . BREAST BIOPSY Left 06/30/2016  . cataracts right eye    . CESAREAN SECTION  1986  . HERNIA REPAIR  2000   RIH  . INTRAMEDULLARY (IM) NAIL INTERTROCHANTERIC Left 01/30/2017   Procedure: ORIF AFFIXUS NAIL;  Surgeon: Paralee Cancel, MD;  Location: [facility];  Service: Orthopedics;  Laterality: Left;  . IR REPLC GASTRO/COLONIC TUBE PERCUT  W/FLUORO  09/29/2016  . opirectinal membrane peel    . PEG PLACEMENT    . RHINOPLASTY  1983  . TRACHEOSTOMY    . TRACHEOSTOMY TUBE PLACEMENT N/A 12/15/2016   Procedure: TRACHEOSTOMY CHANGE;  Surgeon: [name] Montane, MD;  Location: [facility];  Service: ENT;  Laterality: N/A;    There were no vitals filed for this visit.  Subjective Assessment - 07/14/17 1406    Subjective  Pts caregiver reports she got good report from oncologist yesterday, mammogram was clear.      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  No/denies        NMR:  Based on OT response of use of // bars on previous day with more automatic stepping pattern, PT trialed use of L PFRW to determine if this would provide stability and allow pt to feel more confident with gait and allow more upright posture.  Performed gait with L PFRW with +2A during session with OT Antony Salmon) on pts L to provide support at trunk for more upright posture and forward translation onto  LLE during L stance phase of gait with PT on R providing heavy support at R pelvis and L axilla for more R lateral trunk flexion however with continued gait, walker seemed to provide too much input causing pt to pull into more flexed position, therefore removed RW and continued gait with +2A in order to address postural control, LE WB, ROM and having pt initiate movement with decreasing support.  Would intermittently stop ambulation in order to have pt work on improving postural control increasing R lateral flexion, R head tilt, and L lateral weight shift at hips.  With repetition and lighter tactile cues pt was better able to initiate forward weight shift however she still needs cues and assist to maintain upright posture and to shift forward enough with hips/pelvis prior to taking next step.        Given  the severe complex nature of pt's clinical presentation including: 1. Frequent, often sudden, medical declines including significant recurring respiratory issues, risk for sepsis, severe osteoporosis with h/o of multiple pathological hip fractures. 2. Severe multisystem neurological impairment including: - severe spastic tetraplegia - significant cognitive impairments including fluctuating level of attention and arousal, memory, basic processing of information, ability to consistently follow 1-2 step commands. - significant visual perceptual impairments including directionality, postural awareness, body in space, body relative to environment, depth perception -vestibular impairment with resulting nystagmus with positional changes and changes in emotional response with transitional movements. - severe musculoskeletal deficits including contractures (bilateral shoulders, L elbow, L hip, bilateral ankles), severely decreased ROM of trunk and trunk mobility affecting respiration, phonation, feeding and functional mobility - significant disturbances in postural orientation and control in all planes (supine, sitting, standing and stepping) requiring skilled therapeutic facilitation for continued mobility and balance. -severe tone effecting UEs, trunk and LEs making fluidity and transitional movements difficult if not impossible  Patient requires the intervention of skilled therapeutic intervention due to the above clinical presentation as well as the patient's status often fluctuates from session to session requiring advanced clinical reasoning to adjust treatment interventions based on pt response on a regular basis.    Patient at this time will be moved from Rehabilitative Therapy to Maintenance Therapy. Given the above issues pt is at very high risk for: - decline of current level of functional mobility that allows pt to be cared for by one care giver - development of additional co-morbidities  including but not limited to further contracture, worsening spasticity, respiratory complications, risk of skin break down, and further declined in trunk and extremity mobility. -decreased bone density from decreased weight bearing leading to further pathological fractures.   Pt to be seen 1x/wk x12 weeks with re- assessment at 90 days from Physical therapy.                    PT Education - 07/14/17 1407    Education Details  purpose of L PFRW    Person(s) Educated  Patient;Caregiver(s)    Methods  Explanation    Comprehension  Verbalized understanding       PT Short Term Goals - 06/10/17 1305      PT SHORT TERM GOAL #1   Title  Perform sit to/from stand from wheelchair or mat with +1 mod assist.    Baseline  06/09/16: met today    Status  Achieved      PT SHORT TERM GOAL #2   Title  Pt will perform sit to stand from wheelchair to RW with max assist for  pre-gait skill.    Baseline  06/09/17: met today    Status  Achieved      PT SHORT TERM GOAL #3   Title  Pt will transfer supine to sit with max assist.    Baseline  06/09/17: met today, needs increased time to assist    Status  Achieved      PT SHORT TERM GOAL #4   Title  Pt will sit with RUE support with min to mod assist unsupported on side of mat for at least 3" to demo improved trunk strength and core stabilization.    Baseline  06/09/17: met today     Status  Achieved      PT SHORT TERM GOAL #5   Title  Pt will amb. 15' with appropriate assistive device with max assist +1    Baseline  06/09/17: pt now needs mod/max assist of 2 people for gait using modified 3 muskateer assist and blue rocker AFO to right LE    Status  Partially Met        PT Long Term Goals - 07/14/17 1526      PT LONG TERM GOAL #1   Title  Pt will perform supine rolling R and L with min assist  to indicate improved independence with bed mobility.      Baseline  pt was unable to get into supine position when last assessed due to just  being given meds via PEG    Status  Unable to assess      PT LONG TERM GOAL #2   Title  Pt will sit on side of mat with RUE support for at least 5" with SBA     Baseline  pt requires intermittent min A during 6/20 session with RUE support    Status  Not Met      PT LONG TERM GOAL #3   Title  Pt will amb. 20' with appropriate assistive device with +1 max assist.    Baseline  pt requires +2 max to mod assist at this time - no device used - 07-12-17    Status  Not Met      PT LONG TERM GOAL #4   Title  Pt will perform basic transfers with min to mod assist to indicate improved functional independence.      Baseline  max assist needed on 07-12-17    Status  Not Met      PT LONG TERM GOAL #5   Title  Pt will perform static standing x 3" with bil. UE support with mod assist.      Baseline  Pt can complete this in stedy and able to do in // bars, has been unable to do at countertop or with AD.      Status  Partially Met      Additional Long Term Goals   Additional Long Term Goals  Yes      PT LONG TERM GOAL #6   Title  Pt will be able to return to participation in therapeutic horseback riding at [facility] for ongoing exercise program. (NEW MAINTAINANCE GOALS:  Reassess on 7/16, 8/13 and 9/10)      Baseline  Not returned as of 07-12-17 - pt has had bil. hip surgeries    Time  4    Period  Weeks    Status  On-going      PT LONG TERM GOAL #7   Title  Pt will maintain ability to transfer (stand pivot) from  varying seated surfaces at max A level to decrease burden of care to single caregiver.     Time  4    Period  Weeks    Status  New      PT LONG TERM GOAL #8   Title  Pt will maintain sitting balance with RUE support at min A level x 5 minutes in order to maintain postural and trunk control without external support of w/c in order to indicate improved postural alignment.      Time  4    Period  Weeks    Status  New      PT LONG TERM GOAL  #9   TITLE  Pt will maintain ability to  ambulate with +2 max A for 20' in order to faciliate weight bearing to maintain bone density, decrease spasticity, and decrease the progression of contractures.     Time  4    Period  Weeks    Status  New      PT LONG TERM GOAL  #10   TITLE  Pt will maintain ability to stand with RUE support x 5 mins at min A level for LE stretching, for improved bowel, bladder and respiratory function.      Time  4    Period  Weeks    Status  New        LTG 11 to be added to reflect need for orthotics.     Plan - 07/14/17 1407    Clinical Impression Statement  Skilled session focused on upright postural control, LE WB, active LE strengthening and stability, and imroved initiation of movement with gait.  Trialed use of L PFRW, however this seemed to increase pts pull into flexed posture, therefore continued with +2A without AD at this time to promote improved mechanics.      Rehab Potential  Fair    PT Frequency  1x / week    PT Duration  12 weeks    PT Treatment/Interventions  ADLs/Self Care Home Management;Therapeutic exercise;Therapeutic activities;Functional mobility training;Gait training;Balance training;Neuromuscular re-education;Patient/family education;Orthotic Fit/Training;Passive range of motion    PT Next Visit Plan  pt now in maintainence program, work to maintain current level of functioning, assess every 30 days, see LTGs.     PT Home Exercise Plan  caregiver and husband have previously been instructed in ROM, sitting balance, and transfer training    Consulted and Agree with Plan of Care  Patient       Patient will benefit from skilled therapeutic intervention in order to improve the following deficits and impairments:  Abnormal gait, Decreased activity tolerance, Decreased balance, Decreased cognition, Decreased coordination, Decreased mobility, Decreased range of motion, Decreased endurance, Hypomobility, Decreased strength, Impaired flexibility, Increased muscle spasms, Impaired UE  functional use, Postural dysfunction, Pain  Visit Diagnosis: Other abnormalities of gait and mobility  Unsteadiness on feet  Muscle weakness (generalized)  Abnormal posture  Other symptoms and signs involving the nervous system  Stiffness of right hip, not elsewhere classified  Stiffness of left hip, not elsewhere classified  Spastic tetraplegia [facility])     Problem List Patient Active Problem List   Diagnosis Date Noted  . Coagulase negative Staphylococcus bacteremia 02/09/2017  . Abdominal distension   . Chronic mucus hypersecretion, respiratory 12/31/2016  . Severe protein-calorie malnutrition [name]: less than 60% of standard weight) ([place]) 12/31/2016  . Acute urinary retention 12/31/2016  . Constipation 12/31/2016  . Shortness of breath   . Respiratory distress   . Pneumonia 12/05/2016  .  Acute respiratory failure ([place]) 12/05/2016  . Pseudomonas urinary tract infection 10/27/2016  . Severe muscle deconditioning 09/30/2016  . Pressure injury of skin 09/28/2016  . Respiratory failure ([place])   . Tracheobronchitis   . Urinary retention   . PEG (percutaneous endoscopic gastrostomy) status ([place])   . Benign essential HTN   . Hyponatremia   . Hypokalemia   . Reactive hypertension   . Respiratory insufficiency 09/24/2016  . Malignant neoplasm of upper-inner quadrant of left breast in female, estrogen receptor positive ([place]) 07/14/2016  . Heterotopic ossification of bone 03/10/2016  . Acute on chronic respiratory failure with hypoxia ([place])   . Acute hypoxemic respiratory failure ([place])   . Acute blood loss anemia   . Atelectasis 02/04/2016  . Debility 02/03/2016  . Pneumonia of both lower lobes due to Pseudomonas species ([place])   . Pneumonia of both lower lobes due to methicillin susceptible Staphylococcus aureus (MSSA) ([place])   . Acute respiratory failure with hypoxia ([place])   . Acute encephalopathy   . Seizures ([place])   . Sepsis ([place])   . Hypoxia   . Pain   . Chronic  respiratory failure ([place]) 12/28/2015  . Acute tracheobronchitis 12/24/2015  . Tracheostomy tube present ([place])   . Tracheal stenosis   . Chronic respiratory failure with hypoxia ([place])   . Spastic tetraplegia ([place]) 10/20/2015  . Tracheostomy status ([place]) 09/26/2015  . Allergic rhinitis 09/26/2015  . 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 Movement disorder 09/22/2015  . Encephalitis 09/22/2015  . Chest pain 02/07/2014  . Premature atrial contractions 01/13/2014  . Abnormality of gait 12/04/2013  . Hyperlipidemia 12/21/2011  . Cardiovascular risk factor 12/21/2011  . Bunion 01/31/2008  . Metatarsalgia of both feet 09/20/2007  . FLAT FOOT 09/20/2007  . HALLUX RIGIDUS, ACQUIRED 09/20/2007    Cameron Sprang, PT, MPT [facility] [address] [place], [place], [address] Phone: [phone]   Fax:  [phone] 07/15/17, 12:31 PM  Name: Leeana Creer MRN: [id number] Date of Birth: 06-26-1950

## 2017-07-14 NOTE — Patient Instructions (Signed)
Home stretching program:  In supine  1. Hip, knee and ankle flexion with cue to pt to "bring your knee to your chest." 2. Abduction one leg at time - stabilizing other LE and keeping both LE's in hip and knee flexion to avoid extensor tone. 3. Knees together with hip and knee flexion and addressing trunk rotation left and then right by having pt "drop" knees to one side and then providing gentle stretch. 4.  Shoulder flexion and abduction BUE"s with pt actively assisting 5. Stretch to L elbow to maintain current ROM - pt has contracture at 95* of flexion.    Discussed need for gentle stretching given h/o of pathological hip fx's aand use of supine with compensations to avoid primitive reflex influence on tone.

## 2017-07-18 ENCOUNTER — Other Ambulatory Visit: Payer: Self-pay | Admitting: Physical Medicine & Rehabilitation

## 2017-07-18 DIAGNOSIS — G8250 Quadriplegia, unspecified: Secondary | ICD-10-CM

## 2017-07-19 ENCOUNTER — Encounter: Payer: Self-pay | Admitting: Occupational Therapy

## 2017-07-19 ENCOUNTER — Ambulatory Visit: Payer: Medicare Other | Admitting: Occupational Therapy

## 2017-07-19 DIAGNOSIS — R41844 Frontal lobe and executive function deficit: Secondary | ICD-10-CM

## 2017-07-19 DIAGNOSIS — M25611 Stiffness of right shoulder, not elsewhere classified: Secondary | ICD-10-CM

## 2017-07-19 DIAGNOSIS — M25612 Stiffness of left shoulder, not elsewhere classified: Secondary | ICD-10-CM

## 2017-07-19 DIAGNOSIS — M6281 Muscle weakness (generalized): Secondary | ICD-10-CM

## 2017-07-19 DIAGNOSIS — R293 Abnormal posture: Secondary | ICD-10-CM

## 2017-07-19 DIAGNOSIS — R29818 Other symptoms and signs involving the nervous system: Secondary | ICD-10-CM

## 2017-07-19 DIAGNOSIS — R2681 Unsteadiness on feet: Secondary | ICD-10-CM

## 2017-07-19 DIAGNOSIS — R2689 Other abnormalities of gait and mobility: Secondary | ICD-10-CM

## 2017-07-19 DIAGNOSIS — M25622 Stiffness of left elbow, not elsewhere classified: Secondary | ICD-10-CM

## 2017-07-19 DIAGNOSIS — G8250 Quadriplegia, unspecified: Secondary | ICD-10-CM

## 2017-07-19 DIAGNOSIS — R471 Dysarthria and anarthria: Secondary | ICD-10-CM | POA: Diagnosis not present

## 2017-07-19 NOTE — Therapy (Signed)
[facility] [address] [place], [place], [address] Phone: [phone]   Fax:  [phone]  Occupational Therapy Treatment  Patient Details  Name: Judy Spears MRN: [id number] Date of Birth: 1950-03-01 Referring Provider: Dr. Levada Schilling   Encounter Date: 07/19/2017  OT End of Session - 07/19/17 1119    Visit Number  3    Number of Visits  24    Date for OT Re-Evaluation  08/03/17    Authorization Type  medicare maintenance program - pt to be reassessed every 30 days per Medicare guidelines  Will need PN every 10th visit    Authorization Time Period  90 days - 09/28/2017    OT Start Time  1022    OT Stop Time  1104    OT Time Calculation (min)  42 min    Activity Tolerance  Patient tolerated treatment well       Past Medical History:  Diagnosis Date  . Arthritis    lt great toe  . Complication of anesthesia    pt has had headaches post op-did advise to hydra well preop  . Hair loss    right-sided  . History of exercise stress test    a. ETT (12/15) with 12:00 exercise, no ST changes, occasional PACs.   . Hyperlipidemia   . Insomnia   . Migraine headache   . Movement disorder    resltess in left legs  . Postmenopausal   . 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 Premature atrial contractions    a. Holter (12/15) with 8% PACs, no atrial runs or atrial fibrillation.     Past Surgical History:  Procedure Laterality Date  . BREAST BIOPSY  01/01/2011   Procedure: BREAST BIOPSY WITH NEEDLE LOCALIZATION;  Surgeon: Rolm Bookbinder, MD;  Location: [facility];  Service: General;  Laterality: Left;  left breast wire localization biopsy  . BREAST BIOPSY Right 07/27/2012  . BREAST BIOPSY Right 07/07/2016  . BREAST BIOPSY Left 06/30/2016  . cataracts right eye    . CESAREAN SECTION  1986  . HERNIA REPAIR  2000   RIH  . INTRAMEDULLARY (IM) NAIL INTERTROCHANTERIC Left 01/30/2017   Procedure: ORIF  AFFIXUS NAIL;  Surgeon: Paralee Cancel, MD;  Location: [facility];  Service: Orthopedics;  Laterality: Left;  . IR REPLC GASTRO/COLONIC TUBE PERCUT W/FLUORO  09/29/2016  . opirectinal membrane peel    . PEG PLACEMENT    . RHINOPLASTY  1983  . TRACHEOSTOMY    . TRACHEOSTOMY TUBE PLACEMENT N/A 12/15/2016   Procedure: TRACHEOSTOMY CHANGE;  Surgeon: [name] Montane, MD;  Location: [facility];  Service: ENT;  Laterality: N/A;    There were no vitals filed for this visit.  Subjective Assessment - 07/19/17 1024    Currently in Pain?  No/denies  (Pended)                    OT Treatments/Exercises (OP) - 07/19/17 0001      Neurological Re-education Exercises   Other Exercises 1  Patient's caregiver expressing difficulty with finding optimum time to complete stretching program. Discussed doing before pt goes to sleep when she is already in the bed or mid morning on days they don't have appts.  Offered to also train night time caregivers -day time caregiver [name] to discuss with pt's husband to bring night time caregiver into therapy session for training. Again stressed importance of daily stretching program in supine  to maintain ROM and reduce  spasticity as much as possible. Pt has appt for botox injection today for LE's with physiatrist.  Session focused on postural aligment in high sitting with use of weight shifting , forward flexion and rotation to the right to decrease spasticity, increase trunk elongation on left, and facilitate upper thoracic extension as well as head righting. Progressed to sit to stand in parellel bars - pt needs max cues and mod a for hand placement to facilitate forward weight shift - today pt able to complete sit to stand without physical assist in bars after prep and set up for forward weight shift. Addresed postural alignment in static standing as well as with stepping with max facilitation and use of a mirror. Pt is able to follow simple one step commands and is active in weight  shifting and stepping however needs significant facilitation and max cues for step by step progression to improve alignment.  Transitioned out of a parellel bars to increase demand on trunk control, more active weight shifiting and functional stepping with max a x2 for short distances.  Addressed active stand to sit - pt demonstrating decreasd tone, improved postural alignment and move isolated movement in all extremeties at end of session.                 OT Short Term Goals - 07/14/17 1433      OT SHORT TERM GOAL #1   Title  --    Status  --      OT SHORT TERM GOAL #2   Title  --    Status  --      OT SHORT TERM GOAL #6   Title  --      OT SHORT TERM GOAL #7   Title  --        OT Long Term Goals - 07/19/17 1117      OT LONG TERM GOAL #1   Title  Pt will maintain ability to roll in bed during self care tasks with no more than max a x1 - reasssess 08/03/2017    Status  On-going      OT LONG TERM GOAL #2   Title  Pt will maintain the ability to use roll in shower chair with care giver assistance     Status  On-going      OT LONG TERM GOAL #3   Title  Pt will maintain ability to transfer to toilet with no more than max a x1 using Steady Assist - 10/06/2017      OT LONG TERM GOAL #4   Title  Pt wil maintain ability for dressing at max a x1.     Status  On-going      OT LONG TERM GOAL #5   Title  Pt will maintain ability to stand with max a x1 to for clothing mgmt with toileting.     Status  On-going      OT LONG TERM GOAL #6   Title  Pt will maintain ability to participate in functional transfers, as well as stand to sit and sit to stand with no more than max a x1    Status  On-going      OT LONG TERM GOAL #7   Title  Pt will maintain functional mobility and trunk control to aid in her ability to clear sucretions and assist with phonation to express basic needs/wants    Status  On-going            Plan - 07/19/17  1117    Clinical Impression Statement  Pt  initially lethargic with eyes closed and signficant spasticity in extremities and trunk however become much more alert and participatory as session progressed with increased demands for movement.     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    Occupational performance deficits (Please refer to evaluation for details):  ADL's;IADL's;Rest and Sleep;Leisure;Social Participation;Work    Neurosurgeon    Current Impairments/barriers affecting progress:  severity of multi system deficits, intermittent medical issues    OT Frequency  2x / week    OT Duration  12 weeks    OT Treatment/Interventions  Self-care/ADL training;Ultrasound;Moist Heat;Electrical Stimulation;DME and/or AE instruction;Neuromuscular education;Therapeutic exercise;Functional Mobility Training;Manual Therapy;Passive range of motion;Splinting;Therapeutic activities;Balance training;Patient/family education;Visual/perceptual remediation/compensation;Cognitive remediation/compensation    Plan   if night caregivers are present complete training for HEP for stretching, NMR trunk and LE's for transitional movements, static standing, sitting balance, sit to stand, stand to sit, stepping; address tone mgmt    Consulted and Agree with Plan of Care  Patient    Family Member Consulted  caregiver Leesa       Patient will benefit from skilled therapeutic intervention in order to improve the following deficits and impairments:  Decreased endurance, Decreased skin integrity, Impaired vision/preception, Improper body mechanics, Impaired perceived functional ability, Decreased knowledge of precautions, Cardiopulmonary status limiting activity, Decreased activity tolerance, Decreased knowledge of use of DME, Decreased strength,  Impaired flexibility, Improper spinal/pelvic alignment, Impaired sensation, Difficulty walking, Decreased mobility, Decreased balance, Decreased cognition, Decreased range of motion, Impaired tone, Impaired UE functional use, Decreased safety awareness, Decreased coordination, Pain, Abnormal gait  Visit Diagnosis: Unsteadiness on feet  Other abnormalities of gait and mobility  Muscle weakness (generalized)  Abnormal posture  Other symptoms and signs involving the nervous system  Frontal lobe and executive function deficit  Stiffness of right shoulder, not elsewhere classified  Stiffness of left shoulder, not elsewhere classified  Stiffness of left elbow, not elsewhere classified  Spastic tetraplegia (HCC)    Problem List Patient Active Problem List   Diagnosis Date Noted  . Coagulase negative Staphylococcus bacteremia 02/09/2017  . Abdominal distension   . Chronic mucus hypersecretion, respiratory 12/31/2016  . Severe protein-calorie malnutrition [name]: less than 60% of standard weight) ([facility]) 12/31/2016  . Acute urinary retention 12/31/2016  . Constipation 12/31/2016  . Shortness of breath   . Respiratory distress   . Pneumonia 12/05/2016  . Acute respiratory failure ([facility]) 12/05/2016  . Pseudomonas urinary tract infection 10/27/2016  . Severe muscle deconditioning 09/30/2016  . Pressure injury of skin 09/28/2016  . Respiratory failure ([facility])   . Tracheobronchitis   . Urinary retention   . PEG (percutaneous endoscopic gastrostomy) status ([facility])   . Benign essential HTN   . Hyponatremia   . Hypokalemia   . Reactive hypertension   . Respiratory insufficiency 09/24/2016  . Malignant neoplasm of upper-inner quadrant of left breast in female, estrogen receptor positive ([facility]) 07/14/2016  . Heterotopic ossification of bone 03/10/2016  . Acute on chronic respiratory failure with hypoxia ([facility])   . Acute hypoxemic respiratory failure ([facility])   . Acute blood loss anemia   .  Atelectasis 02/04/2016  . Debility 02/03/2016  . Pneumonia of both lower lobes due to Pseudomonas species ([facility])   . Pneumonia of both lower lobes due  to methicillin susceptible Staphylococcus aureus (MSSA) ([facility])   . Acute respiratory failure with hypoxia ([facility])   . Acute encephalopathy   . Seizures ([facility])   . Sepsis ([facility])   . Hypoxia   . Pain   . Chronic respiratory failure ([facility]) 12/28/2015  . Acute tracheobronchitis 12/24/2015  . Tracheostomy tube present ([facility])   . Tracheal stenosis   . Chronic respiratory failure with hypoxia ([facility])   . Spastic tetraplegia ([facility]) 10/20/2015  . Tracheostomy status ([facility]) 09/26/2015  . Allergic rhinitis 09/26/2015  . 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 Movement disorder 09/22/2015  . Encephalitis 09/22/2015  . Chest pain 02/07/2014  . Premature atrial contractions 01/13/2014  . Abnormality of gait 12/04/2013  . Hyperlipidemia 12/21/2011  . Cardiovascular risk factor 12/21/2011  . Bunion 01/31/2008  . Metatarsalgia of both feet 09/20/2007  . FLAT FOOT 09/20/2007  . HALLUX RIGIDUS, ACQUIRED 09/20/2007    Quay Burow, OTR/L 07/19/2017, 11:21 AM  [facility] [address] [place], [place], [address] Phone: [phone]   Fax:  [phone]  Name: Judy Spears MRN: [id number] Date of Birth: 1950/05/22

## 2017-07-21 ENCOUNTER — Ambulatory Visit: Payer: Medicare Other | Admitting: Occupational Therapy

## 2017-07-21 ENCOUNTER — Encounter: Payer: Self-pay | Admitting: Occupational Therapy

## 2017-07-21 DIAGNOSIS — R2681 Unsteadiness on feet: Secondary | ICD-10-CM

## 2017-07-21 DIAGNOSIS — R41844 Frontal lobe and executive function deficit: Secondary | ICD-10-CM

## 2017-07-21 DIAGNOSIS — M6281 Muscle weakness (generalized): Secondary | ICD-10-CM

## 2017-07-21 DIAGNOSIS — R471 Dysarthria and anarthria: Secondary | ICD-10-CM | POA: Diagnosis not present

## 2017-07-21 DIAGNOSIS — R293 Abnormal posture: Secondary | ICD-10-CM

## 2017-07-21 DIAGNOSIS — R29818 Other symptoms and signs involving the nervous system: Secondary | ICD-10-CM

## 2017-07-21 NOTE — Therapy (Signed)
[facility] [address] [place], [place], [address] Phone: [phone]   Fax:  [phone]  Occupational Therapy Treatment  Patient Details  Name: Judy Spears MRN: [id number] Date of Birth: [birth year] Referring Provider: Dr. Levada Schilling   Encounter Date: 07/21/2017  OT End of Session - 07/21/17 1518    Visit Number  4    Number of Visits  24    Date for OT Re-Evaluation  08/03/17    Authorization Type  medicare maintenance program - pt to be reassessed every 30 days per Medicare guidelines  Will need PN every 10th visit    Authorization Time Period  90 days - 09/28/2017    OT Start Time  1408    OT Stop Time  1454    OT Time Calculation (min)  46 min    Activity Tolerance  Patient tolerated treatment well    Behavior During Therapy  [facility] for tasks assessed/performed       Past Medical History:  Diagnosis Date  . Arthritis    lt great toe  . Complication of anesthesia    pt has had headaches post op-did advise to hydra well preop  . Hair loss    right-sided  . History of exercise stress test    a. ETT (12/15) with 12:00 exercise, no ST changes, occasional PACs.   . Hyperlipidemia   . Insomnia   . Migraine headache   . Movement disorder    resltess in left legs  . Postmenopausal   . 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 Premature atrial contractions    a. Holter (12/15) with 8% PACs, no atrial runs or atrial fibrillation.     Past Surgical History:  Procedure Laterality Date  . BREAST BIOPSY  01/01/2011   Procedure: BREAST BIOPSY WITH NEEDLE LOCALIZATION;  Surgeon: Rolm Bookbinder, MD;  Location: [facility];  Service: General;  Laterality: Left;  left breast wire localization biopsy  . BREAST BIOPSY Right 07/27/2012  . BREAST BIOPSY Right 07/07/2016  . BREAST BIOPSY Left 06/30/2016  . cataracts right eye    . CESAREAN SECTION  1986  . HERNIA REPAIR  2000   RIH  . INTRAMEDULLARY  (IM) NAIL INTERTROCHANTERIC Left 01/30/2017   Procedure: ORIF AFFIXUS NAIL;  Surgeon: Paralee Cancel, MD;  Location: [facility];  Service: Orthopedics;  Laterality: Left;  . IR REPLC GASTRO/COLONIC TUBE PERCUT W/FLUORO  09/29/2016  . opirectinal membrane peel    . PEG PLACEMENT    . RHINOPLASTY  1983  . TRACHEOSTOMY    . TRACHEOSTOMY TUBE PLACEMENT N/A 12/15/2016   Procedure: TRACHEOSTOMY CHANGE;  Surgeon: [name] Montane, MD;  Location: [facility];  Service: ENT;  Laterality: N/A;    There were no vitals filed for this visit.  Subjective Assessment - 07/21/17 1511    Subjective   I am fine    Patient is accompained by:  Family member    Pertinent History  see epic snapshot - ABI/encephalitis/hypoxia from virus; recent hospitalization due to MRSA PNA     Patient Stated Goals  Pt unable to state.     Currently in Pain?  No/denies    Pain Score  0-No pain                   OT Treatments/Exercises (OP) - 07/21/17 0001      Neurological Re-education Exercises   Other Exercises 1  Patient very alert this session.  Worked in parallel bars  to address weight bearing through bilateral lower extremities, more neutral alignment of hips and trunk over feet to aide with functional transfers.  Worked on maintaining that neutral alignment of head and hips in stand to sit. Also worked to slow patient's transition to standing, now that she has consistent ability to lift off from elevated surface, to control alignment of upper body over (not in front of feet. Worked with caregiver [name], to complete standing stretch - to help foster improved trunk shortening on right and lengthening on left.       Other Exercises 2  Worked on dynamic standing, standing with decreased relaince on UE support, and stepping, turning, etc as needed for bathroom transfers.                 OT Short Term Goals - 07/14/17 1433      OT SHORT TERM GOAL #1   Title  --    Status  --      OT SHORT TERM GOAL #2   Title  --     Status  --      OT SHORT TERM GOAL #6   Title  --      OT SHORT TERM GOAL #7   Title  --        OT Long Term Goals - 07/19/17 1117      OT LONG TERM GOAL #1   Title  Pt will maintain ability to roll in bed during self care tasks with no more than max a x1 - reasssess 08/03/2017    Status  On-going      OT LONG TERM GOAL #2   Title  Pt will maintain the ability to use roll in shower chair with care giver assistance     Status  On-going      OT LONG TERM GOAL #3   Title  Pt will maintain ability to transfer to toilet with no more than max a x1 using Steady Assist - 10/06/2017      OT LONG TERM GOAL #4   Title  Pt wil maintain ability for dressing at max a x1.     Status  On-going      OT LONG TERM GOAL #5   Title  Pt will maintain ability to stand with max a x1 to for clothing mgmt with toileting.     Status  On-going      OT LONG TERM GOAL #6   Title  Pt will maintain ability to participate in functional transfers, as well as stand to sit and sit to stand with no more than max a x1    Status  On-going      OT LONG TERM GOAL #7   Title  Pt will maintain functional mobility and trunk control to aid in her ability to clear sucretions and assist with phonation to express basic needs/wants    Status  On-going            Plan - 07/21/17 1518    Clinical Impression Statement  Patient has shown consistent performance with sit to stand in parallel bars, on elevated surface, last three sessions.  Caregiver, leesa consistently participates in therapy program, and is using sit to stand lift to continue to address concepts of midline activation in home setting.      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    Occupational performance deficits (Please refer to  evaluation for details):  ADL's;IADL's;Rest and Sleep;Leisure;Social Participation;Work    Neurosurgeon    Current Impairments/barriers affecting progress:  severity of multi system deficits, intermittent medical issues    OT Frequency  2x / week    OT Duration  12 weeks    OT Treatment/Interventions  Self-care/ADL training;Ultrasound;Moist Heat;Electrical Stimulation;DME and/or AE instruction;Neuromuscular education;Therapeutic exercise;Functional Mobility Training;Manual Therapy;Passive range of motion;Splinting;Therapeutic activities;Balance training;Patient/family education;Visual/perceptual remediation/compensation;Cognitive remediation/compensation    Plan   if night caregivers are present complete training for HEP for stretching, NMR trunk and LE's for transitional movements, static standing, sitting balance, sit to stand, stand to sit, stepping; address tone mgmt    Consulted and Agree with Plan of Care  Patient    Family Member Consulted  caregiver Leesa       Patient will benefit from skilled therapeutic intervention in order to improve the following deficits and impairments:  Decreased endurance, Decreased skin integrity, Impaired vision/preception, Improper body mechanics, Impaired perceived functional ability, Decreased knowledge of precautions, Cardiopulmonary status limiting activity, Decreased activity tolerance, Decreased knowledge of use of DME, Decreased strength, Impaired flexibility, Improper spinal/pelvic alignment, Impaired sensation, Difficulty walking, Decreased mobility, Decreased balance, Decreased cognition, Decreased range of motion, Impaired tone, Impaired UE functional use, Decreased safety awareness, Decreased coordination, Pain, Abnormal gait  Visit Diagnosis: Unsteadiness on feet  Abnormal posture  Muscle weakness (generalized)  Other symptoms and signs involving the nervous system  Frontal lobe and executive function deficit    Problem  List Patient Active Problem List   Diagnosis Date Noted  . Coagulase negative Staphylococcus bacteremia 02/09/2017  . Abdominal distension   . Chronic mucus hypersecretion, respiratory 12/31/2016  . Severe protein-calorie malnutrition [name]: less than 60% of standard weight) ([facility]) 12/31/2016  . Acute urinary retention 12/31/2016  . Constipation 12/31/2016  . Shortness of breath   . Respiratory distress   . Pneumonia 12/05/2016  . Acute respiratory failure ([facility]) 12/05/2016  . Pseudomonas urinary tract infection 10/27/2016  . Severe muscle deconditioning 09/30/2016  . Pressure injury of skin 09/28/2016  . Respiratory failure ([facility])   . Tracheobronchitis   . Urinary retention   . PEG (percutaneous endoscopic gastrostomy) status ([facility])   . Benign essential HTN   . Hyponatremia   . Hypokalemia   . Reactive hypertension   . Respiratory insufficiency 09/24/2016  . Malignant neoplasm of upper-inner quadrant of left breast in female, estrogen receptor positive ([facility]) 07/14/2016  . Heterotopic ossification of bone 03/10/2016  . Acute on chronic respiratory failure with hypoxia ([facility])   . Acute hypoxemic respiratory failure ([facility])   . Acute blood loss anemia   . Atelectasis 02/04/2016  . Debility 02/03/2016  . Pneumonia of both lower lobes due to Pseudomonas species ([facility])   . Pneumonia of both lower lobes due to methicillin susceptible Staphylococcus aureus (MSSA) ([facility])   . Acute respiratory failure with hypoxia ([facility])   . Acute encephalopathy   . Seizures ([facility])   . Sepsis ([place])   . Hypoxia   . Pain   . Chronic respiratory failure ([facility]) 12/28/2015  . Acute tracheobronchitis 12/24/2015  . Tracheostomy tube present ([place])   . Tracheal stenosis   . Chronic respiratory failure with hypoxia ([facility])   . Spastic tetraplegia ([facility]) 10/20/2015  . Tracheostomy status ([facility]) 09/26/2015  . Allergic rhinitis 09/26/2015  . 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 Movement disorder 09/22/2015   . Encephalitis  09/22/2015  . Chest pain 02/07/2014  . Premature atrial contractions 01/13/2014  . Abnormality of gait 12/04/2013  . Hyperlipidemia 12/21/2011  . Cardiovascular risk factor 12/21/2011  . Bunion 01/31/2008  . Metatarsalgia of both feet 09/20/2007  . FLAT FOOT 09/20/2007  . HALLUX RIGIDUS, ACQUIRED 09/20/2007    Mariah Milling, OTR/L 07/21/2017, 3:21 PM  [facility] [address] [place], [place], [address] Phone: [phone]   Fax:  [phone]  Name: Judy Spears MRN: [id number] Date of Birth: [birth year]

## 2017-07-22 ENCOUNTER — Encounter: Payer: Self-pay | Admitting: Rehabilitation

## 2017-07-22 ENCOUNTER — Ambulatory Visit: Payer: Medicare Other | Admitting: Rehabilitation

## 2017-07-22 DIAGNOSIS — M25652 Stiffness of left hip, not elsewhere classified: Secondary | ICD-10-CM

## 2017-07-22 DIAGNOSIS — M6281 Muscle weakness (generalized): Secondary | ICD-10-CM

## 2017-07-22 DIAGNOSIS — R471 Dysarthria and anarthria: Secondary | ICD-10-CM | POA: Diagnosis not present

## 2017-07-22 DIAGNOSIS — R2689 Other abnormalities of gait and mobility: Secondary | ICD-10-CM

## 2017-07-22 DIAGNOSIS — R2681 Unsteadiness on feet: Secondary | ICD-10-CM

## 2017-07-22 DIAGNOSIS — R293 Abnormal posture: Secondary | ICD-10-CM

## 2017-07-22 DIAGNOSIS — R29818 Other symptoms and signs involving the nervous system: Secondary | ICD-10-CM

## 2017-07-22 DIAGNOSIS — M25651 Stiffness of right hip, not elsewhere classified: Secondary | ICD-10-CM

## 2017-07-22 NOTE — Therapy (Signed)
[facility] [address] [place], [place], [address] Phone: [phone]   Fax:  [phone]  Physical Therapy Treatment  Patient Details  Name: Judy Spears MRN: [id number] Date of Birth: [birth year] Referring Provider: Dr. Levada Schilling   Encounter Date: 07/22/2017  PT End of Session - 07/22/17 1304    Visit Number  18    Number of Visits  28 per updated POC    Date for PT Re-Evaluation  10/04/17    Authorization Type  [employer] Medicare    Authorization Time Period  07/12/17-10/04/17    PT Start Time  1100    PT Stop Time  1145    PT Time Calculation (min)  45 min    Activity Tolerance  Patient tolerated treatment well    Behavior During Therapy  [facility] for tasks assessed/performed       Past Medical History:  Diagnosis Date  . Arthritis    lt great toe  . Complication of anesthesia    pt has had headaches post op-did advise to hydra well preop  . Hair loss    right-sided  . History of exercise stress test    a. ETT (12/15) with 12:00 exercise, no ST changes, occasional PACs.   . Hyperlipidemia   . Insomnia   . Migraine headache   . Movement disorder    resltess in left legs  . Postmenopausal   . 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 Premature atrial contractions    a. Holter (12/15) with 8% PACs, no atrial runs or atrial fibrillation.     Past Surgical History:  Procedure Laterality Date  . BREAST BIOPSY  01/01/2011   Procedure: BREAST BIOPSY WITH NEEDLE LOCALIZATION;  Surgeon: Rolm Bookbinder, MD;  Location: [facility];  Service: General;  Laterality: Left;  left breast wire localization biopsy  . BREAST BIOPSY Right 07/27/2012  . BREAST BIOPSY Right 07/07/2016  . BREAST BIOPSY Left 06/30/2016  . cataracts right eye    . CESAREAN SECTION  1986  . HERNIA REPAIR  2000   RIH  . INTRAMEDULLARY (IM) NAIL INTERTROCHANTERIC Left 01/30/2017   Procedure: ORIF AFFIXUS NAIL;  Surgeon: Paralee Cancel,  MD;  Location: [facility];  Service: Orthopedics;  Laterality: Left;  . IR REPLC GASTRO/COLONIC TUBE PERCUT W/FLUORO  09/29/2016  . opirectinal membrane peel    . PEG PLACEMENT    . RHINOPLASTY  1983  . TRACHEOSTOMY    . TRACHEOSTOMY TUBE PLACEMENT N/A 12/15/2016   Procedure: TRACHEOSTOMY CHANGE;  Surgeon: [name] Montane, MD;  Location: [facility];  Service: ENT;  Laterality: N/A;    There were no vitals filed for this visit.  Subjective Assessment - 07/22/17 1303    Subjective  Pt reports doing well, no changes.      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  No/denies              NMR:  While in // bars had pt standing for at least 35 mins continuously with BUE>single UE support (RUE) addressing postural alignment, truncal flexibility and trunk rotation, postural and trunk control, LE WB (making WB equal with increased L lateral weight shift), forward and retro stepping to carryover to improved breath control, LE WB and flexibility, and maintaining  ability to perform transitional movements and bed mobility.  While in // bars, PT providing intermittent S to mod/max A (depending on task and amount of support needed).  Providing less support at times to allow pt to process through how to make changes to posture and alignment but at times providing mod to max A for increased R lateral trunk flex and R trunk rotation.  Pt is able to initiate movement, but needs assist to fully attain and maintain position.  Also had her perform marching on RLE and LLE x 5 reps each with cues for adequate weight shift and posture (caregiver, [name] providing support for LUE due to contracture and pt pulling down with LUE when on // bars), progressing to forward/retro stepping in // bars with tactile tapping at hamstrings for increased knee flex esp for  backwards gait along with cues for posture and forward/backward weight shift prior to advancing opposite foot.  Pt tolerated very well and following NMR in // bars, note improved sitting posture in w/c.                   PT Education - 07/22/17 1304    Education Details  trying to come up with consisten plan for stretching program.    Person(s) Educated  Patient;Spouse;Caregiver(s)    Methods  Explanation    Comprehension  Verbalized understanding       PT Short Term Goals - 06/10/17 1305      PT SHORT TERM GOAL #1   Title  Perform sit to/from stand from wheelchair or mat with +1 mod assist.    Baseline  06/09/16: met today    Status  Achieved      PT SHORT TERM GOAL #2   Title  Pt will perform sit to stand from wheelchair to RW with max assist for pre-gait skill.    Baseline  06/09/17: met today    Status  Achieved      PT SHORT TERM GOAL #3   Title  Pt will transfer supine to sit with max assist.    Baseline  06/09/17: met today, needs increased time to assist    Status  Achieved      PT SHORT TERM GOAL #4   Title  Pt will sit with RUE support with min to mod assist unsupported on side of mat for at least 3" to demo improved trunk strength and core stabilization.    Baseline  06/09/17: met today     Status  Achieved      PT SHORT TERM GOAL #5   Title  Pt will amb. 15' with appropriate assistive device with max assist +1    Baseline  06/09/17: pt now needs mod/max assist of 2 people for gait using modified 3 muskateer assist and blue rocker AFO to right LE    Status  Partially Met        PT Long Term Goals - 07/15/17 1234      PT LONG TERM GOAL #6   Title  Pt will be able to return to participation in therapeutic horseback riding at [place] for ongoing exercise program. (NEW MAINTAINANCE GOALS:  Reassess on 7/16, 8/13 and 9/10)      Baseline  Not returned as of 07-12-17 - pt has had bil. hip surgeries    Time  4    Period  Weeks    Status  New      PT  LONG TERM GOAL #7   Title  Pt  will maintain ability to transfer (stand pivot) from varying seated surfaces at max A level to decrease burden of care to single caregiver.     Time  4    Period  Weeks    Status  New      PT LONG TERM GOAL #8   Title  Pt will maintain sitting balance with RUE support at min A level x 5 minutes in order to maintain postural and trunk control without external support of w/c in order to indicate improved postural alignment.      Time  4    Period  Weeks    Status  New      PT LONG TERM GOAL  #9   TITLE  Pt will maintain ability to ambulate with +2 max A for 20' in order to faciliate weight bearing to maintain bone density, decrease spasticity, and decrease the progression of contractures.     Time  4    Period  Weeks    Status  New      PT LONG TERM GOAL  #10   TITLE  Pt will maintain ability to stand with RUE support x 5 mins at min A level for LE stretching, for improved bowel, bladder and respiratory function.      Time  4    Period  Weeks    Status  New            Plan - 07/22/17 1305    Clinical Impression Statement  Skilled session focused on standing in // bars with use of mirror and BUE>RUE support to address upright postures, improved postural and trunk control, improving trunk mobility and rotation to carryover to improved transfers and bed mobility and gait in // bars for improved LE WB, LE flexibility and improved initiation of movement to correct postural alignment.      Rehab Potential  Fair    PT Frequency  1x / week    PT Duration  12 weeks    PT Treatment/Interventions  ADLs/Self Care Home Management;Therapeutic exercise;Therapeutic activities;Functional mobility training;Gait training;Balance training;Neuromuscular re-education;Patient/family education;Orthotic Fit/Training;Passive range of motion    PT Next Visit Plan  I think we have to come up with a plan for stretching program as they are still inconsistent with program.  pt now  in maintainence program, work to maintain current level of functioning, assess every 30 days, see LTGs.     PT Home Exercise Plan  caregiver and husband have previously been instructed in ROM, sitting balance, and transfer training    Consulted and Agree with Plan of Care  Patient       Patient will benefit from skilled therapeutic intervention in order to improve the following deficits and impairments:  Abnormal gait, Decreased activity tolerance, Decreased balance, Decreased cognition, Decreased coordination, Decreased mobility, Decreased range of motion, Decreased endurance, Hypomobility, Decreased strength, Impaired flexibility, Increased muscle spasms, Impaired UE functional use, Postural dysfunction, Pain  Visit Diagnosis: Abnormal posture  Unsteadiness on feet  Muscle weakness (generalized)  Other symptoms and signs involving the nervous system  Other abnormalities of gait and mobility  Stiffness of left hip, not elsewhere classified  Stiffness of right hip, not elsewhere classified     Problem List Patient Active Problem List   Diagnosis Date Noted  . Coagulase negative Staphylococcus bacteremia 02/09/2017  . Abdominal distension   . Chronic mucus hypersecretion, respiratory 12/31/2016  . Severe protein-calorie malnutrition [name]: less than 60% of standard weight) ([place]) 12/31/2016  . Acute urinary  retention 12/31/2016  . Constipation 12/31/2016  . Shortness of breath   . Respiratory distress   . Pneumonia 12/05/2016  . Acute respiratory failure ([place]) 12/05/2016  . Pseudomonas urinary tract infection 10/27/2016  . Severe muscle deconditioning 09/30/2016  . Pressure injury of skin 09/28/2016  . Respiratory failure ([place])   . Tracheobronchitis   . Urinary retention   . PEG (percutaneous endoscopic gastrostomy) status ([place])   . Benign essential HTN   . Hyponatremia   . Hypokalemia   . Reactive hypertension   . Respiratory insufficiency 09/24/2016  . Malignant  neoplasm of upper-inner quadrant of left breast in female, estrogen receptor positive ([place]) 07/14/2016  . Heterotopic ossification of bone 03/10/2016  . Acute on chronic respiratory failure with hypoxia ([place])   . Acute hypoxemic respiratory failure ([place])   . Acute blood loss anemia   . Atelectasis 02/04/2016  . Debility 02/03/2016  . Pneumonia of both lower lobes due to Pseudomonas species ([place])   . Pneumonia of both lower lobes due to methicillin susceptible Staphylococcus aureus (MSSA) ([place])   . Acute respiratory failure with hypoxia ([place])   . Acute encephalopathy   . Seizures ([place])   . Sepsis ([place])   . Hypoxia   . Pain   . Chronic respiratory failure ([place]) 12/28/2015  . Acute tracheobronchitis 12/24/2015  . Tracheostomy tube present ([place])   . Tracheal stenosis   . Chronic respiratory failure with hypoxia ([place])   . Spastic tetraplegia ([place]) 10/20/2015  . Tracheostomy status ([place]) 09/26/2015  . Allergic rhinitis 09/26/2015  . 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 Movement disorder 09/22/2015  . Encephalitis 09/22/2015  . Chest pain 02/07/2014  . Premature atrial contractions 01/13/2014  . Abnormality of gait 12/04/2013  . Hyperlipidemia 12/21/2011  . Cardiovascular risk factor 12/21/2011  . Bunion 01/31/2008  . Metatarsalgia of both feet 09/20/2007  . FLAT FOOT 09/20/2007  . HALLUX RIGIDUS, ACQUIRED 09/20/2007    Cameron Sprang, PT, MPT [facility] [address] [place], [place], [address] Phone: [phone]   Fax:  [phone] 07/22/17, 1:17 PM   Name: Judy Spears MRN: [id number] Date of Birth: 1950/09/23

## 2017-07-25 ENCOUNTER — Ambulatory Visit: Payer: Medicare Other | Attending: Physician Assistant | Admitting: Occupational Therapy

## 2017-07-25 ENCOUNTER — Encounter: Payer: Self-pay | Admitting: Occupational Therapy

## 2017-07-25 DIAGNOSIS — R41841 Cognitive communication deficit: Secondary | ICD-10-CM | POA: Insufficient documentation

## 2017-07-25 DIAGNOSIS — M6281 Muscle weakness (generalized): Secondary | ICD-10-CM

## 2017-07-25 DIAGNOSIS — R2689 Other abnormalities of gait and mobility: Secondary | ICD-10-CM | POA: Diagnosis present

## 2017-07-25 DIAGNOSIS — R471 Dysarthria and anarthria: Secondary | ICD-10-CM | POA: Insufficient documentation

## 2017-07-25 DIAGNOSIS — R4701 Aphasia: Secondary | ICD-10-CM | POA: Diagnosis present

## 2017-07-25 DIAGNOSIS — G8250 Quadriplegia, unspecified: Secondary | ICD-10-CM

## 2017-07-25 DIAGNOSIS — R1312 Dysphagia, oropharyngeal phase: Secondary | ICD-10-CM | POA: Insufficient documentation

## 2017-07-25 DIAGNOSIS — R41844 Frontal lobe and executive function deficit: Secondary | ICD-10-CM | POA: Diagnosis present

## 2017-07-25 DIAGNOSIS — M25651 Stiffness of right hip, not elsewhere classified: Secondary | ICD-10-CM | POA: Insufficient documentation

## 2017-07-25 DIAGNOSIS — R29818 Other symptoms and signs involving the nervous system: Secondary | ICD-10-CM

## 2017-07-25 DIAGNOSIS — M25611 Stiffness of right shoulder, not elsewhere classified: Secondary | ICD-10-CM

## 2017-07-25 DIAGNOSIS — M25612 Stiffness of left shoulder, not elsewhere classified: Secondary | ICD-10-CM | POA: Diagnosis present

## 2017-07-25 DIAGNOSIS — R293 Abnormal posture: Secondary | ICD-10-CM

## 2017-07-25 DIAGNOSIS — R2681 Unsteadiness on feet: Secondary | ICD-10-CM | POA: Diagnosis present

## 2017-07-25 DIAGNOSIS — M25622 Stiffness of left elbow, not elsewhere classified: Secondary | ICD-10-CM

## 2017-07-25 DIAGNOSIS — R278 Other lack of coordination: Secondary | ICD-10-CM | POA: Insufficient documentation

## 2017-07-25 DIAGNOSIS — M25652 Stiffness of left hip, not elsewhere classified: Secondary | ICD-10-CM | POA: Insufficient documentation

## 2017-07-25 NOTE — Therapy (Signed)
[facility] [address] [place], [place], [address] Phone: [phone]   Fax:  [phone]  Occupational Therapy Treatment  Patient Details  Name: Judy Spears MRN: [id number] Date of Birth: Feb 25, 1950 Referring Provider: Dr. Levada Schilling   Encounter Date: 07/25/2017  OT End of Session - 07/25/17 1651    Visit Number  5    Number of Visits  24    Date for OT Re-Evaluation  08/03/17    Authorization Type  medicare maintenance program - pt to be reassessed every 30 days per Medicare guidelines  Will need PN every 10th visit    Authorization Time Period  90 days - 09/28/2017    OT Start Time  1532    OT Stop Time  1614    OT Time Calculation (min)  42 min    Activity Tolerance  Patient tolerated treatment well       Past Medical History:  Diagnosis Date  . Arthritis    lt great toe  . Complication of anesthesia    pt has had headaches post op-did advise to hydra well preop  . Hair loss    right-sided  . History of exercise stress test    a. ETT (12/15) with 12:00 exercise, no ST changes, occasional PACs.   . Hyperlipidemia   . Insomnia   . Migraine headache   . Movement disorder    resltess in left legs  . Postmenopausal   . 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 Premature atrial contractions    a. Holter (12/15) with 8% PACs, no atrial runs or atrial fibrillation.     Past Surgical History:  Procedure Laterality Date  . BREAST BIOPSY  01/01/2011   Procedure: BREAST BIOPSY WITH NEEDLE LOCALIZATION;  Surgeon: Rolm Bookbinder, MD;  Location: [facility];  Service: General;  Laterality: Left;  left breast wire localization biopsy  . BREAST BIOPSY Right 07/27/2012  . BREAST BIOPSY Right 07/07/2016  . BREAST BIOPSY Left 06/30/2016  . cataracts right eye    . CESAREAN SECTION  1986  . HERNIA REPAIR  2000   RIH  . INTRAMEDULLARY (IM) NAIL INTERTROCHANTERIC Left 01/30/2017   Procedure: ORIF  AFFIXUS NAIL;  Surgeon: Paralee Cancel, MD;  Location: [facility];  Service: Orthopedics;  Laterality: Left;  . IR REPLC GASTRO/COLONIC TUBE PERCUT W/FLUORO  09/29/2016  . opirectinal membrane peel    . PEG PLACEMENT    . RHINOPLASTY  1983  . TRACHEOSTOMY    . TRACHEOSTOMY TUBE PLACEMENT N/A 12/15/2016   Procedure: TRACHEOSTOMY CHANGE;  Surgeon: [name] Montane, MD;  Location: [facility];  Service: ENT;  Laterality: N/A;    There were no vitals filed for this visit.  Subjective Assessment - 07/25/17 1638    Subjective   Let's go!    Patient is accompained by:  Family member husband [name]    Pertinent History  see epic snapshot - ABI/encephalitis/hypoxia from virus; recent hospitalization due to MRSA PNA     Patient Stated Goals  Pt unable to state.     Currently in Pain?  No/denies                   OT Treatments/Exercises (OP) - 07/25/17 0001      Neurological Re-education Exercises   Other Exercises 1  Pt semi alert at begining of session howver became more alert and participatory through out the session with increased demand for movement.  Pt with signficant rigidity and  increased tone throughout trunk and LE's today. Husband reports that pt had botox injection 6 days ago to LE's adductors.  Addressed reducing tone, decreasing rigitidy, increasing range of motion for trunk as well as bilateral shoulder flexion, elongation of the left side of trunk and active shortening on R side of trunk in sitting .  Pt with gradual reduction in tone and rigidity via faciltation technique and with more forward weight shift in preparation for sit to stand. Addressed sit to stand from elevated mat surface - pt initially required max a however with repetition able to complete by end of session from elevated surface with min ax2.  Addressed  postural alignment and postural orientation in standing and stepping, incorporating weight shifting. At end of session pt able to actively move from stand to sit with mod a x1  and cues and demostrated significant improvement in postural alignment in wheelchair as well as improved phonation for single words. Husband reports he has used videotape made of stretching exercises to train evening aides and is completing nightly with aides.                 OT Short Term Goals - 07/14/17 1433      OT SHORT TERM GOAL #1   Title  --    Status  --      OT SHORT TERM GOAL #2   Title  --    Status  --      OT SHORT TERM GOAL #6   Title  --      OT SHORT TERM GOAL #7   Title  --        OT Long Term Goals - 07/25/17 1648      OT LONG TERM GOAL #1   Title  Pt will maintain ability to roll in bed during self care tasks with no more than max a x1 - reasssess 08/03/2017    Status  On-going      OT LONG TERM GOAL #2   Title  Pt will maintain the ability to use roll in shower chair with care giver assistance     Status  On-going      OT LONG TERM GOAL #3   Title  Pt will maintain ability to transfer to toilet with no more than max a x1 using Steady Assist - 10/06/2017      OT LONG TERM GOAL #4   Title  Pt wil maintain ability for dressing at max a x1.     Status  On-going      OT LONG TERM GOAL #5   Title  Pt will maintain ability to stand with max a x1 to for clothing mgmt with toileting.     Status  On-going      OT LONG TERM GOAL #6   Title  Pt will maintain ability to participate in functional transfers, as well as stand to sit and sit to stand with no more than max a x1    Status  On-going      OT LONG TERM GOAL #7   Title  Pt will maintain functional mobility and trunk control to aid in her ability to clear sucretions and assist with phonation to express basic needs/wants    Status  On-going            Plan - 07/25/17 1649    Clinical Impression Statement  Pt s/p botox injections to BLE's for adductors last week.  Pt today able to relax LE's  in terms of adduction slightly with cues in standing.  Incorporating mobility from various surfaces  as pt has to be able to stand from many different surfaces at home.     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    Occupational performance deficits (Please refer to evaluation for details):  ADL's;IADL's;Rest and Sleep;Leisure;Social Participation;Work    Neurosurgeon    Current Impairments/barriers affecting progress:  severity of multi system deficits, intermittent medical issues    OT Frequency  2x / week    OT Duration  12 weeks    OT Treatment/Interventions  Self-care/ADL training;Ultrasound;Moist Heat;Electrical Stimulation;DME and/or AE instruction;Neuromuscular education;Therapeutic exercise;Functional Mobility Training;Manual Therapy;Passive range of motion;Splinting;Therapeutic activities;Balance training;Patient/family education;Visual/perceptual remediation/compensation;Cognitive remediation/compensation    Plan  address rigidity and tone in trunk and extremeties, NMR trunk and LE's for transitional movements, static standing, sitting balance, sit to stand, stand to sit, stepping;     Consulted and Agree with Plan of Care  Patient;Family member/caregiver    Family Member Consulted  husband [name]       Patient will benefit from skilled therapeutic intervention in order to improve the following deficits and impairments:  Decreased endurance, Decreased skin integrity, Impaired vision/preception, Improper body mechanics, Impaired perceived functional ability, Decreased knowledge of precautions, Cardiopulmonary status limiting activity, Decreased activity tolerance, Decreased knowledge of use of DME, Decreased strength, Impaired flexibility, Improper spinal/pelvic alignment, Impaired sensation, Difficulty walking, Decreased mobility, Decreased balance, Decreased  cognition, Decreased range of motion, Impaired tone, Impaired UE functional use, Decreased safety awareness, Decreased coordination, Pain, Abnormal gait  Visit Diagnosis: Abnormal posture  Unsteadiness on feet  Muscle weakness (generalized)  Other symptoms and signs involving the nervous system  Frontal lobe and executive function deficit  Stiffness of right shoulder, not elsewhere classified  Stiffness of left shoulder, not elsewhere classified  Stiffness of left elbow, not elsewhere classified  Spastic tetraplegia [facility])    Problem List Patient Active Problem List   Diagnosis Date Noted  . Coagulase negative Staphylococcus bacteremia 02/09/2017  . Abdominal distension   . Chronic mucus hypersecretion, respiratory 12/31/2016  . Severe protein-calorie malnutrition [name]: less than 60% of standard weight) ([place]) 12/31/2016  . Acute urinary retention 12/31/2016  . Constipation 12/31/2016  . Shortness of breath   . Respiratory distress   . Pneumonia 12/05/2016  . Acute respiratory failure ([place]) 12/05/2016  . Pseudomonas urinary tract infection 10/27/2016  . Severe muscle deconditioning 09/30/2016  . Pressure injury of skin 09/28/2016  . Respiratory failure ([place])   . Tracheobronchitis   . Urinary retention   . PEG (percutaneous endoscopic gastrostomy) status ([place])   . Benign essential HTN   . Hyponatremia   . Hypokalemia   . Reactive hypertension   . Respiratory insufficiency 09/24/2016  . Malignant neoplasm of upper-inner quadrant of left breast in female, estrogen receptor positive ([place]) 07/14/2016  . Heterotopic ossification of bone 03/10/2016  . Acute on chronic respiratory failure with hypoxia ([place])   . Acute hypoxemic respiratory failure ([place])   . Acute blood loss anemia   . Atelectasis 02/04/2016  . Debility 02/03/2016  . Pneumonia of both lower lobes due to Pseudomonas species ([place])   . Pneumonia of both lower lobes due to methicillin susceptible  Staphylococcus aureus (MSSA) ([place])   . Acute respiratory failure with hypoxia ([place])   .  Acute encephalopathy   . Seizures ([place])   . Sepsis ([place])   . Hypoxia   . Pain   . Chronic respiratory failure ([place]) 12/28/2015  . Acute tracheobronchitis 12/24/2015  . Tracheostomy tube present ([place])   . Tracheal stenosis   . Chronic respiratory failure with hypoxia ([place])   . Spastic tetraplegia ([place]) 10/20/2015  . Tracheostomy status ([place]) 09/26/2015  . Allergic rhinitis 09/26/2015  . 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 Movement disorder 09/22/2015  . Encephalitis 09/22/2015  . Chest pain 02/07/2014  . Premature atrial contractions 01/13/2014  . Abnormality of gait 12/04/2013  . Hyperlipidemia 12/21/2011  . Cardiovascular risk factor 12/21/2011  . Bunion 01/31/2008  . Metatarsalgia of both feet 09/20/2007  . FLAT FOOT 09/20/2007  . HALLUX RIGIDUS, ACQUIRED 09/20/2007    Quay Burow, OTR/L 07/25/2017, 4:53 PM  [facility] [address] [place], [place], [address] Phone: [phone]   Fax:  [phone]  Name: Judy Spears MRN: [id number] Date of Birth: [birth year]

## 2017-07-26 ENCOUNTER — Encounter: Payer: Self-pay | Admitting: Occupational Therapy

## 2017-07-26 ENCOUNTER — Ambulatory Visit: Payer: Medicare Other | Admitting: Occupational Therapy

## 2017-07-26 DIAGNOSIS — R293 Abnormal posture: Secondary | ICD-10-CM | POA: Diagnosis not present

## 2017-07-26 DIAGNOSIS — M25612 Stiffness of left shoulder, not elsewhere classified: Secondary | ICD-10-CM

## 2017-07-26 DIAGNOSIS — G8250 Quadriplegia, unspecified: Secondary | ICD-10-CM

## 2017-07-26 DIAGNOSIS — R29818 Other symptoms and signs involving the nervous system: Secondary | ICD-10-CM

## 2017-07-26 DIAGNOSIS — M25622 Stiffness of left elbow, not elsewhere classified: Secondary | ICD-10-CM

## 2017-07-26 DIAGNOSIS — R41844 Frontal lobe and executive function deficit: Secondary | ICD-10-CM

## 2017-07-26 DIAGNOSIS — R2681 Unsteadiness on feet: Secondary | ICD-10-CM

## 2017-07-26 DIAGNOSIS — M6281 Muscle weakness (generalized): Secondary | ICD-10-CM

## 2017-07-26 DIAGNOSIS — R2689 Other abnormalities of gait and mobility: Secondary | ICD-10-CM

## 2017-07-26 DIAGNOSIS — M25611 Stiffness of right shoulder, not elsewhere classified: Secondary | ICD-10-CM

## 2017-07-26 NOTE — Therapy (Signed)
[facility] [address] [place], [place], [address] Phone: [phone]   Fax:  [phone]  Occupational Therapy Treatment  Patient Details  Name: Judy Spears MRN: [id number] Date of Birth: [birth year] Referring Provider: Dr. Levada Schilling   Encounter Date: 07/26/2017  OT End of Session - 07/26/17 1252    Visit Number  6    Number of Visits  24    Date for OT Re-Evaluation  08/03/17    Authorization Type  medicare maintenance program - pt to be reassessed every 30 days per Medicare guidelines  Will need PN every 10th visit    Authorization Time Period  90 days - 09/28/2017    OT Start Time  1016    OT Stop Time  1058    OT Time Calculation (min)  42 min    Activity Tolerance  Patient tolerated treatment well       Past Medical History:  Diagnosis Date  . Arthritis    lt great toe  . Complication of anesthesia    pt has had headaches post op-did advise to hydra well preop  . Hair loss    right-sided  . History of exercise stress test    a. ETT (12/15) with 12:00 exercise, no ST changes, occasional PACs.   . Hyperlipidemia   . Insomnia   . Migraine headache   . Movement disorder    resltess in left legs  . Postmenopausal   . 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 Premature atrial contractions    a. Holter (12/15) with 8% PACs, no atrial runs or atrial fibrillation.     Past Surgical History:  Procedure Laterality Date  . BREAST BIOPSY  01/01/2011   Procedure: BREAST BIOPSY WITH NEEDLE LOCALIZATION;  Surgeon: Rolm Bookbinder, MD;  Location: [facility];  Service: General;  Laterality: Left;  left breast wire localization biopsy  . BREAST BIOPSY Right 07/27/2012  . BREAST BIOPSY Right 07/07/2016  . BREAST BIOPSY Left 06/30/2016  . cataracts right eye    . CESAREAN SECTION  1986  . HERNIA REPAIR  2000   RIH  . INTRAMEDULLARY (IM) NAIL INTERTROCHANTERIC Left 01/30/2017   Procedure: ORIF  AFFIXUS NAIL;  Surgeon: Paralee Cancel, MD;  Location: [facility];  Service: Orthopedics;  Laterality: Left;  . IR REPLC GASTRO/COLONIC TUBE PERCUT W/FLUORO  09/29/2016  . opirectinal membrane peel    . PEG PLACEMENT    . RHINOPLASTY  1983  . TRACHEOSTOMY    . TRACHEOSTOMY TUBE PLACEMENT N/A 12/15/2016   Procedure: TRACHEOSTOMY CHANGE;  Surgeon: [name] Montane, MD;  Location: [facility];  Service: ENT;  Laterality: N/A;    There were no vitals filed for this visit.  Subjective Assessment - 07/26/17 1237    Subjective   "no" when asked if feet or lower legs hurt from edema    Patient is accompained by:  Family member husband [name] and caregiver Leesa    Pertinent History  see epic snapshot - ABI/encephalitis/hypoxia from virus; recent hospitalization due to MRSA PNA     Patient Stated Goals  Pt unable to state.     Currently in Pain?  No/denies                   OT Treatments/Exercises (OP) - 07/26/17 0001      ADLs   ADL Comments  Pt presenting with BLE pitting edema that does not improve with activity (by measurement). This does improve with elevation  at night however pt sits in dependent position in wheelchair during the day.  Discussed if pt has TED hose and husband states they have them at home and use them "once in awhile".  Discussed that use during the day if pt can tolerate should help to decrease the dependent edema pt develops during the day.  Instructed to wear during the day only (pt can remove at night and if she lays down during the day to rest with legs elevated).  Also instructed to closely monitor skin. Both verbalized understanding.       Neurological Re-education Exercises   Other Exercises 1  Neuro re ed to address forward bending as well as rotation with emphasis on active shortening of R side of trunk as well as active lengthening on L side of trunk in both sitting and standing.  Addressed deep breathing from belly with facilitation for rib cage expansion and mobility with  inhale and pursed lip exhale.  Use tissue in front of pt with cues for pt to "move the tissue." WIth practice and facilitation, pt able to move tissue slightly.  Instructed caregiver and husband on how to work on this at home to maintain respiratory status as well  as address ability to phonate.  In standing, also addressed pt's ability to use forced air and recruitment of trunk musculature to phonate outloud single familiar words.  Addressed sit to stand in parallel bars from wheelchair seat - pt needed min facilitation to initiate but then able to follow through with close supervision.  Address postural alignment and control in standing as well as weigh shifting to maintain ability for one caregiver to manage clothing during toileting.  Progressed to forward and backward stepping in bars to work on postural orientation, shifting weight over alternate LE's and encouraging thoracic extension. Pt requires significant skilled facilitation, max cues and max a x1, min a x1 in bars.  Pt able to actively control stand to sit.                 OT Short Term Goals - 07/14/17 1433      OT SHORT TERM GOAL #1   Title  --    Status  --      OT SHORT TERM GOAL #2   Title  --    Status  --      OT SHORT TERM GOAL #6   Title  --      OT SHORT TERM GOAL #7   Title  --        OT Long Term Goals - 07/26/17 1250      OT LONG TERM GOAL #1   Title  Pt will maintain ability to roll in bed during self care tasks with no more than max a x1 - reasssess 08/03/2017    Status  On-going      OT LONG TERM GOAL #2   Title  Pt will maintain the ability to use roll in shower chair with care giver assistance     Status  On-going      OT LONG TERM GOAL #3   Title  Pt will maintain ability to transfer to toilet with no more than max a x1 using Steady Assist - 10/06/2017      OT LONG TERM GOAL #4   Title  Pt wil maintain ability for dressing at max a x1.     Status  On-going      OT LONG TERM GOAL #5  Title  Pt will maintain ability to stand with max a x1 to for clothing mgmt with toileting.     Status  On-going      OT LONG TERM GOAL #6   Title  Pt will maintain ability to participate in functional transfers, as well as stand to sit and sit to stand with no more than max a x1    Status  On-going      OT LONG TERM GOAL #7   Title  Pt will maintain functional mobility and trunk control to aid in her ability to clear sucretions and assist with phonation to express basic needs/wants    Status  On-going            Plan - 07/26/17 1251    Clinical Impression Statement  Pt able to participate in respiratory activities today as well as functional mobility. Pt alert during session today.    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    Occupational performance deficits (Please refer to evaluation for details):  ADL's;IADL's;Rest and Sleep;Leisure;Social Participation;Work    Neurosurgeon    Current Impairments/barriers affecting progress:  severity of multi system deficits, intermittent medical issues    OT Frequency  2x / week    OT Duration  12 weeks    OT Treatment/Interventions  Self-care/ADL training;Ultrasound;Moist Heat;Electrical Stimulation;DME and/or AE instruction;Neuromuscular education;Therapeutic exercise;Functional Mobility Training;Manual Therapy;Passive range of motion;Splinting;Therapeutic activities;Balance training;Patient/family education;Visual/perceptual remediation/compensation;Cognitive remediation/compensation    Plan  address rigidity and tone in trunk and extremeties, NMR trunk and LE's for transitional movements, static standing, sitting balance, sit to stand, stand to sit, stepping;     Consulted and Agree with Plan of Care  Patient;Family  member/caregiver    Family Member Consulted  husband [name]       Patient will benefit from skilled therapeutic intervention in order to improve the following deficits and impairments:  Decreased endurance, Decreased skin integrity, Impaired vision/preception, Improper body mechanics, Impaired perceived functional ability, Decreased knowledge of precautions, Cardiopulmonary status limiting activity, Decreased activity tolerance, Decreased knowledge of use of DME, Decreased strength, Impaired flexibility, Improper spinal/pelvic alignment, Impaired sensation, Difficulty walking, Decreased mobility, Decreased balance, Decreased cognition, Decreased range of motion, Impaired tone, Impaired UE functional use, Decreased safety awareness, Decreased coordination, Pain, Abnormal gait  Visit Diagnosis: Abnormal posture  Unsteadiness on feet  Other symptoms and signs involving the nervous system  Muscle weakness (generalized)  Frontal lobe and executive function deficit  Stiffness of right shoulder, not elsewhere classified  Stiffness of left shoulder, not elsewhere classified  Stiffness of left elbow, not elsewhere classified  Spastic tetraplegia (HCC)  Other abnormalities of gait and mobility    Problem List Patient Active Problem List   Diagnosis Date Noted  . Coagulase negative Staphylococcus bacteremia 02/09/2017  . Abdominal distension   . Chronic mucus hypersecretion, respiratory 12/31/2016  . Severe protein-calorie malnutrition [name]: less than 60% of standard weight) ([place]) 12/31/2016  . Acute urinary retention 12/31/2016  . Constipation 12/31/2016  . Shortness of breath   . Respiratory distress   . Pneumonia 12/05/2016  . Acute respiratory failure ([place]) 12/05/2016  . Pseudomonas urinary tract infection 10/27/2016  . Severe muscle deconditioning 09/30/2016  . Pressure injury of skin 09/28/2016  . Respiratory failure ([place])   . Tracheobronchitis   . Urinary retention   .  PEG (percutaneous endoscopic gastrostomy) status ([place])   . Benign essential HTN   . Hyponatremia   . Hypokalemia   . Reactive hypertension   . Respiratory insufficiency 09/24/2016  . Malignant neoplasm of upper-inner quadrant of left breast in female, estrogen receptor positive ([place]) 07/14/2016  . Heterotopic ossification of bone 03/10/2016  . Acute on chronic respiratory failure with hypoxia ([place])   . Acute hypoxemic respiratory failure ([place])   . Acute blood loss anemia   . Atelectasis 02/04/2016  . Debility 02/03/2016  . Pneumonia of both lower lobes due to Pseudomonas species ([place])   . Pneumonia of both lower lobes due to methicillin susceptible Staphylococcus aureus (MSSA) ([place])   . Acute respiratory failure with hypoxia ([place])   . Acute encephalopathy   . Seizures ([place])   . Sepsis ([place])   . Hypoxia   . Pain   . Chronic respiratory failure ([place]) 12/28/2015  . Acute tracheobronchitis 12/24/2015  . Tracheostomy tube present ([place])   . Tracheal stenosis   . Chronic respiratory failure with hypoxia ([place])   . Spastic tetraplegia ([place]) 10/20/2015  . Tracheostomy status ([place]) 09/26/2015  . Allergic rhinitis 09/26/2015  . 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 Movement disorder 09/22/2015  . Encephalitis 09/22/2015  . Chest pain 02/07/2014  . Premature atrial contractions 01/13/2014  . Abnormality of gait 12/04/2013  . Hyperlipidemia 12/21/2011  . Cardiovascular risk factor 12/21/2011  . Bunion 01/31/2008  . Metatarsalgia of both feet 09/20/2007  . FLAT FOOT 09/20/2007  . HALLUX RIGIDUS, ACQUIRED 09/20/2007    Quay Burow, OTR/L 07/26/2017, 12:54 PM  [facility] [address] [place], [place], [address] Phone: [phone]   Fax:  [phone]  Name: Judy Spears MRN: [id number] Date of Birth: 07/30/1950

## 2017-08-02 ENCOUNTER — Ambulatory Visit: Payer: Medicare Other | Admitting: Occupational Therapy

## 2017-08-02 ENCOUNTER — Encounter: Payer: Self-pay | Admitting: Occupational Therapy

## 2017-08-02 DIAGNOSIS — M25611 Stiffness of right shoulder, not elsewhere classified: Secondary | ICD-10-CM

## 2017-08-02 DIAGNOSIS — G8250 Quadriplegia, unspecified: Secondary | ICD-10-CM

## 2017-08-02 DIAGNOSIS — M25622 Stiffness of left elbow, not elsewhere classified: Secondary | ICD-10-CM

## 2017-08-02 DIAGNOSIS — R2681 Unsteadiness on feet: Secondary | ICD-10-CM

## 2017-08-02 DIAGNOSIS — R293 Abnormal posture: Secondary | ICD-10-CM | POA: Diagnosis not present

## 2017-08-02 DIAGNOSIS — R41844 Frontal lobe and executive function deficit: Secondary | ICD-10-CM

## 2017-08-02 DIAGNOSIS — M6281 Muscle weakness (generalized): Secondary | ICD-10-CM

## 2017-08-02 DIAGNOSIS — R2689 Other abnormalities of gait and mobility: Secondary | ICD-10-CM

## 2017-08-02 DIAGNOSIS — M25612 Stiffness of left shoulder, not elsewhere classified: Secondary | ICD-10-CM

## 2017-08-02 NOTE — Therapy (Signed)
[facility] [address] [place], [place], [address] Phone: [phone]   Fax:  [phone]  Occupational Therapy Treatment  Patient Details  Name: Judy Spears MRN: [id number] Date of Birth: 06-Feb-1950 Referring Provider: Dr. Levada Schilling   Encounter Date: 08/02/2017  OT End of Session - 08/02/17 1240    Visit Number  7    Number of Visits  24    Date for OT Re-Evaluation  08/03/17    Authorization Type  medicare maintenance program - pt to be reassessed every 30 days per Medicare guidelines  Will need PN every 10th visit    Authorization Time Period  90 days - 09/28/2017    OT Start Time  0933    OT Stop Time  1015    OT Time Calculation (min)  42 min    Activity Tolerance  Treatment limited secondary to agitation       Past Medical History:  Diagnosis Date  . Arthritis    lt great toe  . Complication of anesthesia    pt has had headaches post op-did advise to hydra well preop  . Hair loss    right-sided  . History of exercise stress test    a. ETT (12/15) with 12:00 exercise, no ST changes, occasional PACs.   . Hyperlipidemia   . Insomnia   . Migraine headache   . Movement disorder    resltess in left legs  . Postmenopausal   . 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 Premature atrial contractions    a. Holter (12/15) with 8% PACs, no atrial runs or atrial fibrillation.     Past Surgical History:  Procedure Laterality Date  . BREAST BIOPSY  01/01/2011   Procedure: BREAST BIOPSY WITH NEEDLE LOCALIZATION;  Surgeon: Rolm Bookbinder, MD;  Location: [facility];  Service: General;  Laterality: Left;  left breast wire localization biopsy  . BREAST BIOPSY Right 07/27/2012  . BREAST BIOPSY Right 07/07/2016  . BREAST BIOPSY Left 06/30/2016  . cataracts right eye    . CESAREAN SECTION  1986  . HERNIA REPAIR  2000   RIH  . INTRAMEDULLARY (IM) NAIL INTERTROCHANTERIC Left 01/30/2017   Procedure:  ORIF AFFIXUS NAIL;  Surgeon: Paralee Cancel, MD;  Location: [facility];  Service: Orthopedics;  Laterality: Left;  . IR REPLC GASTRO/COLONIC TUBE PERCUT W/FLUORO  09/29/2016  . opirectinal membrane peel    . PEG PLACEMENT    . RHINOPLASTY  1983  . TRACHEOSTOMY    . TRACHEOSTOMY TUBE PLACEMENT N/A 12/15/2016   Procedure: TRACHEOSTOMY CHANGE;  Surgeon: [name] Montane, MD;  Location: [facility];  Service: ENT;  Laterality: N/A;    There were no vitals filed for this visit.  Subjective Assessment - 08/02/17 0936    Subjective   Pt moaning when moved into supine position on wedge.  Shook head no when asked if she was in pain    Patient is accompained by:  Family member husband Judy Spears and caregiver Judy Spears    Pertinent History  see epic snapshot - ABI/encephalitis/hypoxia from virus; recent hospitalization due to MRSA PNA     Patient Stated Goals  Pt unable to state.     Currently in Pain?  No/denies                   OT Treatments/Exercises (OP) - 08/02/17 0001      Neurological Re-education Exercises   Other Exercises 1  Neuro re ed in supine on  wedge to address significant tone and rigidity. Pt appears agitated today - moaning and increased breathing rate when layed supine on wedge however denied pain. Use of music and therapuetic use of self positively impacted pt's ability to decrease agitation.  In supine utilized facilitation techniques to decrease rigidiity/tone in LE''s, trunk, neck and UE's.  Utilized facilitation, handling, use of rotation and elongation of trunk with pt able to actively participate in movement patterns once rigidity and tone decreased.  Also addressd respiratory breathing with faciliattion for rib cage and trunk expansion.  Pt able to roll to L at end of session with max cues and mod a. Transitioned into sitting with max a x1 and then able to clear upper respiratory with strong cough as well as to swallow own secretions with cues.  Sit to stand with max a x1 with improved  postural alignment and respirations.  Max a x1 for wheelchair transfers.                   OT Long Term Goals - 08/02/17 1239      OT LONG TERM GOAL #1   Title  Pt will maintain ability to roll in bed during self care tasks with no more than max a x1 - reasssess 08/03/2017    Status  On-going      OT LONG TERM GOAL #2   Title  Pt will maintain the ability to use roll in shower chair with care giver assistance     Status  On-going      OT LONG TERM GOAL #3   Title  Pt will maintain ability to transfer to toilet with no more than max a x1 using Steady Assist - 10/06/2017      OT LONG TERM GOAL #4   Title  Pt wil maintain ability for dressing at max a x1.     Status  On-going      OT LONG TERM GOAL #5   Title  Pt will maintain ability to stand with max a x1 to for clothing mgmt with toileting.     Status  On-going      OT LONG TERM GOAL #6   Title  Pt will maintain ability to participate in functional transfers, as well as stand to sit and sit to stand with no more than max a x1    Status  On-going      OT LONG TERM GOAL #7   Title  Pt will maintain functional mobility and trunk control to aid in her ability to clear sucretions and assist with phonation to express basic needs/wants    Status  On-going            Plan - 08/02/17 1239    Clinical Impression Statement  Pt initially agitated and restless however by end of session able to particpate and demonstrated improved postural control.     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    Occupational performance deficits (Please refer to evaluation for details):  ADL's;IADL's;Rest and Sleep;Leisure;Social Participation;Work    Neurosurgeon    Current Impairments/barriers affecting progress:   severity of multi system deficits, intermittent medical issues    OT Frequency  2x / week    OT Duration  12 weeks    OT Treatment/Interventions  Self-care/ADL training;Ultrasound;Moist Heat;Electrical  Stimulation;DME and/or AE instruction;Neuromuscular education;Therapeutic exercise;Functional Mobility Training;Manual Therapy;Passive range of motion;Splinting;Therapeutic activities;Balance training;Patient/family education;Visual/perceptual remediation/compensation;Cognitive remediation/compensation    Plan  address rigidity and tone in trunk and extremeties, NMR trunk and LE's for transitional movements, static standing, sitting balance, sit to stand, stand to sit, stepping;     Consulted and Agree with Plan of Care  Patient;Family member/caregiver    Family Member Consulted  husband Judy Spears, caregiver Judy Spears       Patient will benefit from skilled therapeutic intervention in order to improve the following deficits and impairments:  Decreased endurance, Decreased skin integrity, Impaired vision/preception, Improper body mechanics, Impaired perceived functional ability, Decreased knowledge of precautions, Cardiopulmonary status limiting activity, Decreased activity tolerance, Decreased knowledge of use of DME, Decreased strength, Impaired flexibility, Improper spinal/pelvic alignment, Impaired sensation, Difficulty walking, Decreased mobility, Decreased balance, Decreased cognition, Decreased range of motion, Impaired tone, Impaired UE functional use, Decreased safety awareness, Decreased coordination, Pain, Abnormal gait  Visit Diagnosis: Abnormal posture  Unsteadiness on feet  Muscle weakness (generalized)  Frontal lobe and executive function deficit  Stiffness of right shoulder, not elsewhere classified  Stiffness of left shoulder, not elsewhere classified  Stiffness of left elbow, not elsewhere classified  Spastic tetraplegia (HCC)  Other abnormalities of gait and  mobility    Problem List Patient Active Problem List   Diagnosis Date Noted  . Coagulase negative Staphylococcus bacteremia 02/09/2017  . Abdominal distension   . Chronic mucus hypersecretion, respiratory 12/31/2016  . Severe protein-calorie malnutrition [name]: less than 60% of standard weight) ([place]) 12/31/2016  . Acute urinary retention 12/31/2016  . Constipation 12/31/2016  . Shortness of breath   . Respiratory distress   . Pneumonia 12/05/2016  . Acute respiratory failure ([place]) 12/05/2016  . Pseudomonas urinary tract infection 10/27/2016  . Severe muscle deconditioning 09/30/2016  . Pressure injury of skin 09/28/2016  . Respiratory failure ([place])   . Tracheobronchitis   . Urinary retention   . PEG (percutaneous endoscopic gastrostomy) status ([place])   . Benign essential HTN   . Hyponatremia   . Hypokalemia   . Reactive hypertension   . Respiratory insufficiency 09/24/2016  . Malignant neoplasm of upper-inner quadrant of left breast in female, estrogen receptor positive ([place]) 07/14/2016  . Heterotopic ossification of bone 03/10/2016  . Acute on chronic respiratory failure with hypoxia ([place])   . Acute hypoxemic respiratory failure ([place])   . Acute blood loss anemia   . Atelectasis 02/04/2016  . Debility 02/03/2016  . Pneumonia of both lower lobes due to Pseudomonas species ([place])   . Pneumonia of both lower lobes due to methicillin susceptible Staphylococcus aureus (MSSA) ([place])   . Acute respiratory failure with hypoxia ([place])   . Acute encephalopathy   . Seizures ([place])   . Sepsis ([place])   . Hypoxia   . Pain   . Chronic respiratory failure ([place]) 12/28/2015  . Acute tracheobronchitis 12/24/2015  . Tracheostomy tube present ([place])   . Tracheal stenosis   . Chronic respiratory failure with hypoxia ([place])   . Spastic tetraplegia ([place]) 10/20/2015  . Tracheostomy status ([place]) 09/26/2015  . Allergic rhinitis 09/26/2015  . 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  Movement disorder 09/22/2015  . Encephalitis 09/22/2015  . Chest pain 02/07/2014  . Premature atrial contractions 01/13/2014  . Abnormality of gait 12/04/2013  . Hyperlipidemia 12/21/2011  . Cardiovascular risk factor 12/21/2011  . Bunion 01/31/2008  . Metatarsalgia of both feet 09/20/2007  . FLAT FOOT 09/20/2007  . HALLUX RIGIDUS, ACQUIRED 09/20/2007  Forde Radon [facility] 08/02/2017, 12:43 PM  [facility] [address] [place], [place], [address] Phone: [phone]   Fax:  [phone]  Name: Judy Spears MRN: [id number] Date of Birth: 28-Feb-1950

## 2017-08-03 ENCOUNTER — Encounter: Payer: Self-pay | Admitting: Occupational Therapy

## 2017-08-03 ENCOUNTER — Ambulatory Visit: Payer: Medicare Other | Admitting: Occupational Therapy

## 2017-08-03 DIAGNOSIS — R293 Abnormal posture: Secondary | ICD-10-CM

## 2017-08-03 DIAGNOSIS — R278 Other lack of coordination: Secondary | ICD-10-CM

## 2017-08-03 DIAGNOSIS — R41844 Frontal lobe and executive function deficit: Secondary | ICD-10-CM

## 2017-08-03 DIAGNOSIS — M6281 Muscle weakness (generalized): Secondary | ICD-10-CM

## 2017-08-03 DIAGNOSIS — R2681 Unsteadiness on feet: Secondary | ICD-10-CM

## 2017-08-03 NOTE — Therapy (Signed)
[facility] [address] [place], [place], [address] Phone: [phone]   Fax:  [phone]  Occupational Therapy Treatment  Patient Details  Name: Judy Spears MRN: [id number] Date of Birth: [birth year] Referring Provider: Dr. Levada Schilling   Encounter Date: 08/03/2017  OT End of Session - 08/03/17 1547    Visit Number  8    Number of Visits  24    Date for OT Re-Evaluation  08/03/17    Authorization Type  medicare maintenance program - pt to be reassessed every 30 days per Medicare guidelines  Will need PN every 10th visit    Authorization Time Period  90 days - 09/28/2017    OT Start Time  1447    OT Stop Time  1530    OT Time Calculation (min)  43 min    Activity Tolerance  Patient tolerated treatment well    Behavior During Therapy  [facility] for tasks assessed/performed       Past Medical History:  Diagnosis Date  . Arthritis    lt great toe  . Complication of anesthesia    pt has had headaches post op-did advise to hydra well preop  . Hair loss    right-sided  . History of exercise stress test    a. ETT (12/15) with 12:00 exercise, no ST changes, occasional PACs.   . Hyperlipidemia   . Insomnia   . Migraine headache   . Movement disorder    resltess in left legs  . Postmenopausal   . 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 Premature atrial contractions    a. Holter (12/15) with 8% PACs, no atrial runs or atrial fibrillation.     Past Surgical History:  Procedure Laterality Date  . BREAST BIOPSY  01/01/2011   Procedure: BREAST BIOPSY WITH NEEDLE LOCALIZATION;  Surgeon: Rolm Bookbinder, MD;  Location: [facility];  Service: General;  Laterality: Left;  left breast wire localization biopsy  . BREAST BIOPSY Right 07/27/2012  . BREAST BIOPSY Right 07/07/2016  . BREAST BIOPSY Left 06/30/2016  . cataracts right eye    . CESAREAN SECTION  1986  . HERNIA REPAIR  2000   RIH  . INTRAMEDULLARY  (IM) NAIL INTERTROCHANTERIC Left 01/30/2017   Procedure: ORIF AFFIXUS NAIL;  Surgeon: Paralee Cancel, MD;  Location: [facility];  Service: Orthopedics;  Laterality: Left;  . IR REPLC GASTRO/COLONIC TUBE PERCUT W/FLUORO  09/29/2016  . opirectinal membrane peel    . PEG PLACEMENT    . RHINOPLASTY  1983  . TRACHEOSTOMY    . TRACHEOSTOMY TUBE PLACEMENT N/A 12/15/2016   Procedure: TRACHEOSTOMY CHANGE;  Surgeon: [name] Montane, MD;  Location: [facility];  Service: ENT;  Laterality: N/A;    There were no vitals filed for this visit.  Subjective Assessment - 08/03/17 1540    Subjective   Good    Patient is accompained by:  Family member    Pertinent History  see epic snapshot - ABI/encephalitis/hypoxia from virus; recent hospitalization due to MRSA PNA     Patient Stated Goals  Pt unable to state.     Currently in Pain?  No/denies    Pain Score  0-No pain                   OT Treatments/Exercises (OP) - 08/03/17 0001      Neurological Re-education Exercises   Other Exercises 1  Neuromuscular reeducation to address postural control, and trunk range of motion.  Worked in sitting position on physioball to address right lateral flexion in trunk, lateral flexion in neck. and range in right hip.  Worked on forward flexion in sitting with emphasis on midline alignment for sit to stand.  Worked on stand balance (static) with decreased reliance on upper extremities.  Caregiver Leesa assisted patient and therapist for stand step transfers and stepping.  Caregiver reports last two days they have done stand step transfers vs sit to stand lift for bathroom transfers.                 OT Short Term Goals - 07/14/17 1433      OT SHORT TERM GOAL #1   Title  --    Status  --      OT SHORT TERM GOAL #2   Title  --    Status  --      OT SHORT TERM GOAL #6   Title  --      OT SHORT TERM GOAL #7   Title  --        OT Long Term Goals - 08/02/17 1239      OT LONG TERM GOAL #1   Title  Pt will  maintain ability to roll in bed during self care tasks with no more than max a x1 - reasssess 08/03/2017    Status  On-going      OT LONG TERM GOAL #2   Title  Pt will maintain the ability to use roll in shower chair with care giver assistance     Status  On-going      OT LONG TERM GOAL #3   Title  Pt will maintain ability to transfer to toilet with no more than max a x1 using Steady Assist - 10/06/2017      OT LONG TERM GOAL #4   Title  Pt wil maintain ability for dressing at max a x1.     Status  On-going      OT LONG TERM GOAL #5   Title  Pt will maintain ability to stand with max a x1 to for clothing mgmt with toileting.     Status  On-going      OT LONG TERM GOAL #6   Title  Pt will maintain ability to participate in functional transfers, as well as stand to sit and sit to stand with no more than max a x1    Status  On-going      OT LONG TERM GOAL #7   Title  Pt will maintain functional mobility and trunk control to aid in her ability to clear sucretions and assist with phonation to express basic needs/wants    Status  On-going            Plan - 08/03/17 1548    Clinical Impression Statement  Patient's caregiver has been actively involved in rehab process for consistent carryover to home whenever possible.  Patient with fluctuating ability to control muscle rigidity.      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    Occupational performance deficits (Please refer to evaluation for details):  ADL's;IADL's;Rest and Sleep;Leisure;Social Participation;Work    Rehab Potential  Good    Current Impairments/barriers affecting progress:  severity of multi system deficits, intermittent medical issues    OT Frequency  2x / week  OT Duration  12 weeks    OT  Treatment/Interventions  Self-care/ADL training;Ultrasound;Moist Heat;Electrical Stimulation;DME and/or AE instruction;Neuromuscular education;Therapeutic exercise;Functional Mobility Training;Manual Therapy;Passive range of motion;Splinting;Therapeutic activities;Balance training;Patient/family education;Visual/perceptual remediation/compensation;Cognitive remediation/compensation    Plan  address rigidity and tone in trunk and extremeties, NMR trunk and LE's for transitional movements, static standing, sitting balance, sit to stand, stand to sit, stepping;     Consulted and Agree with Plan of Care  Patient;Family member/caregiver    Family Member Consulted  husband [name], caregiver Leesa       Patient will benefit from skilled therapeutic intervention in order to improve the following deficits and impairments:  Decreased endurance, Decreased skin integrity, Impaired vision/preception, Improper body mechanics, Impaired perceived functional ability, Decreased knowledge of precautions, Cardiopulmonary status limiting activity, Decreased activity tolerance, Decreased knowledge of use of DME, Decreased strength, Impaired flexibility, Improper spinal/pelvic alignment, Impaired sensation, Difficulty walking, Decreased mobility, Decreased balance, Decreased cognition, Decreased range of motion, Impaired tone, Impaired UE functional use, Decreased safety awareness, Decreased coordination, Pain, Abnormal gait  Visit Diagnosis: Abnormal posture  Unsteadiness on feet  Muscle weakness (generalized)  Frontal lobe and executive function deficit  Other lack of coordination    Problem List Patient Active Problem List   Diagnosis Date Noted  . Coagulase negative Staphylococcus bacteremia 02/09/2017  . Abdominal distension   . Chronic mucus hypersecretion, respiratory 12/31/2016  . Severe protein-calorie malnutrition [name]: less than 60% of standard weight) ([place]) 12/31/2016  . Acute urinary retention  12/31/2016  . Constipation 12/31/2016  . Shortness of breath   . Respiratory distress   . Pneumonia 12/05/2016  . Acute respiratory failure ([place]) 12/05/2016  . Pseudomonas urinary tract infection 10/27/2016  . Severe muscle deconditioning 09/30/2016  . Pressure injury of skin 09/28/2016  . Respiratory failure ([place])   . Tracheobronchitis   . Urinary retention   . PEG (percutaneous endoscopic gastrostomy) status ([place])   . Benign essential HTN   . Hyponatremia   . Hypokalemia   . Reactive hypertension   . Respiratory insufficiency 09/24/2016  . Malignant neoplasm of upper-inner quadrant of left breast in female, estrogen receptor positive ([place]) 07/14/2016  . Heterotopic ossification of bone 03/10/2016  . Acute on chronic respiratory failure with hypoxia ([place])   . Acute hypoxemic respiratory failure ([place])   . Acute blood loss anemia   . Atelectasis 02/04/2016  . Debility 02/03/2016  . Pneumonia of both lower lobes due to Pseudomonas species ([place])   . Pneumonia of both lower lobes due to methicillin susceptible Staphylococcus aureus (MSSA) ([place])   . Acute respiratory failure with hypoxia ([place])   . Acute encephalopathy   . Seizures ([place])   . Sepsis ([place])   . Hypoxia   . Pain   . Chronic respiratory failure ([place]) 12/28/2015  . Acute tracheobronchitis 12/24/2015  . Tracheostomy tube present ([place])   . Tracheal stenosis   . Chronic respiratory failure with hypoxia ([place])   . Spastic tetraplegia ([place]) 10/20/2015  . Tracheostomy status ([place]) 09/26/2015  . Allergic rhinitis 09/26/2015  . 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 Movement disorder 09/22/2015  . Encephalitis 09/22/2015  . Chest pain 02/07/2014  . Premature atrial contractions 01/13/2014  . Abnormality of gait 12/04/2013  . Hyperlipidemia 12/21/2011  . Cardiovascular risk factor 12/21/2011  . Bunion 01/31/2008  . Metatarsalgia of both feet 09/20/2007  . FLAT FOOT 09/20/2007  . HALLUX RIGIDUS, ACQUIRED  09/20/2007    Mariah Milling, OTR/L 08/03/2017, 3:51 PM  [facility] [address] [place], [place], [address] Phone: [phone]   Fax:  [phone]  Name: Judy Spears MRN: [id number] Date of Birth: Dec 25, 1950

## 2017-08-04 ENCOUNTER — Ambulatory Visit: Payer: Medicare Other | Admitting: Physical Therapy

## 2017-08-04 DIAGNOSIS — R29818 Other symptoms and signs involving the nervous system: Secondary | ICD-10-CM

## 2017-08-04 DIAGNOSIS — R2689 Other abnormalities of gait and mobility: Secondary | ICD-10-CM

## 2017-08-04 DIAGNOSIS — M6281 Muscle weakness (generalized): Secondary | ICD-10-CM

## 2017-08-04 DIAGNOSIS — R293 Abnormal posture: Secondary | ICD-10-CM | POA: Diagnosis not present

## 2017-08-05 ENCOUNTER — Encounter: Payer: Self-pay | Admitting: Physical Therapy

## 2017-08-05 NOTE — Therapy (Signed)
[facility] [address] [place], [place], [address] Phone: [phone]   Fax:  [phone]  Physical Therapy Treatment  Patient Details  Name: Judy Spears MRN: [id number] Date of Birth: 08-29-1950 Referring Provider: Dr. Levada Schilling   Encounter Date: 08/04/2017  PT End of Session - 08/05/17 1023    Visit Number  19    Number of Visits  28    Date for PT Re-Evaluation  10/04/17    Authorization Time Period  07/12/17-10/04/17    PT Start Time  1103    PT Stop Time  1150    PT Time Calculation (min)  47 min       Past Medical History:  Diagnosis Date  . Arthritis    lt great toe  . Complication of anesthesia    pt has had headaches post op-did advise to hydra well preop  . Hair loss    right-sided  . History of exercise stress test    a. ETT (12/15) with 12:00 exercise, no ST changes, occasional PACs.   . Hyperlipidemia   . Insomnia   . Migraine headache   . Movement disorder    resltess in left legs  . Postmenopausal   . 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 Premature atrial contractions    a. Holter (12/15) with 8% PACs, no atrial runs or atrial fibrillation.     Past Surgical History:  Procedure Laterality Date  . BREAST BIOPSY  01/01/2011   Procedure: BREAST BIOPSY WITH NEEDLE LOCALIZATION;  Surgeon: Rolm Bookbinder, MD;  Location: [facility];  Service: General;  Laterality: Left;  left breast wire localization biopsy  . BREAST BIOPSY Right 07/27/2012  . BREAST BIOPSY Right 07/07/2016  . BREAST BIOPSY Left 06/30/2016  . cataracts right eye    . CESAREAN SECTION  1986  . HERNIA REPAIR  2000   RIH  . INTRAMEDULLARY (IM) NAIL INTERTROCHANTERIC Left 01/30/2017   Procedure: ORIF AFFIXUS NAIL;  Surgeon: Paralee Cancel, MD;  Location: [facility];  Service: Orthopedics;  Laterality: Left;  . IR REPLC GASTRO/COLONIC TUBE PERCUT W/FLUORO  09/29/2016  . opirectinal membrane peel    . PEG PLACEMENT     . RHINOPLASTY  1983  . TRACHEOSTOMY    . TRACHEOSTOMY TUBE PLACEMENT N/A 12/15/2016   Procedure: TRACHEOSTOMY CHANGE;  Surgeon: [name] Montane, MD;  Location: [facility];  Service: ENT;  Laterality: N/A;    There were no vitals filed for this visit.  Subjective Assessment - 08/05/17 1015    Subjective  No changes reported by caregiver - Judy Spears reports they are doing stretching program at home - mostly at night    Patient is accompained by:  Family member    Pertinent History  [place] viral encephalitis; anoxic BI:  ORIF Lt hip 02-09-17;  Rt THR  04-12-17    Patient Stated Goals  improve mobility; walk    Currently in Pain?  No/denies                       [facility] Adult PT Treatment/Exercise - 08/05/17 0001      Ambulation/Gait   Ambulation/Gait  Yes    Ambulation/Gait Assistance  1: +2 Total assist PT assisted by pt's husband & caregiver    Ambulation/Gait Assistance Details  mod to max assist +3  no orthotics used on LE's    Ambulation Distance (Feet)  20 Feet    Assistive device  2 person hand  held assist    Gait Pattern  Step-to pattern;Step-through pattern;Decreased stride length;Decreased dorsiflexion - right;Decreased dorsiflexion - left;Decreased weight shift to left;Ataxic;Trunk flexed;Narrow base of support;Poor foot clearance - left Caregiver, Judy Spears, assisted pt in advancing LLE    Ambulation Surface  Level;Indoor      Knee/Hip Exercises: Stretches   Passive Hamstring Stretch  Both;2 reps;30 seconds      Knee/Hip Exercises: Supine   Heel Slides  AAROM;Both;1 set;10 reps    Bridges  AROM;Both;1 set;10 reps    Bridges Limitations  mod assist to keep both knees flexed to prevent feet from sliding as performed without shoes     Other Supine Knee/Hip Exercises  hip abdct./adduction in hooklying       Pt transferred from wheelchair to mat with max assist using stand pivot transfer;  NeuroRe-ed: worked on sitting balance unsupported on side of mat with pt transferring  sit to Rt and Lt 1/2 side sitting Propped on elbows  2 reps to each side; incorporated lateral trunk stretching of contralateral side with leaning on Rt/Lt forearms Tactile cues for head righting  Sit to stand from mat with max assist +1       PT Short Term Goals - 06/10/17 1305      PT SHORT TERM GOAL #1   Title  Perform sit to/from stand from wheelchair or mat with +1 mod assist.    Baseline  06/09/16: met today    Status  Achieved      PT SHORT TERM GOAL #2   Title  Pt will perform sit to stand from wheelchair to RW with max assist for pre-gait skill.    Baseline  06/09/17: met today    Status  Achieved      PT SHORT TERM GOAL #3   Title  Pt will transfer supine to sit with max assist.    Baseline  06/09/17: met today, needs increased time to assist    Status  Achieved      PT SHORT TERM GOAL #4   Title  Pt will sit with RUE support with min to mod assist unsupported on side of mat for at least 3" to demo improved trunk strength and core stabilization.    Baseline  06/09/17: met today     Status  Achieved      PT SHORT TERM GOAL #5   Title  Pt will amb. 15' with appropriate assistive device with max assist +1    Baseline  06/09/17: pt now needs mod/max assist of 2 people for gait using modified 3 muskateer assist and blue rocker AFO to right LE    Status  Partially Met        PT Long Term Goals - 07/15/17 1234      PT LONG TERM GOAL #6   Title  Pt will be able to return to participation in therapeutic horseback riding at [facility] for ongoing exercise program. (NEW MAINTAINANCE GOALS:  Reassess on 7/16, 8/13 and 9/10)      Baseline  Not returned as of 07-12-17 - pt has had bil. hip surgeries    Time  4    Period  Weeks    Status  New      PT LONG TERM GOAL #7   Title  Pt will maintain ability to transfer (stand pivot) from varying seated surfaces at max A level to decrease burden of care to single caregiver.     Time  4    Period  Weeks  Status  New      PT  LONG TERM GOAL #8   Title  Pt will maintain sitting balance with RUE support at min A level x 5 minutes in order to maintain postural and trunk control without external support of w/c in order to indicate improved postural alignment.      Time  4    Period  Weeks    Status  New      PT LONG TERM GOAL  #9   TITLE  Pt will maintain ability to ambulate with +2 max A for 20' in order to faciliate weight bearing to maintain bone density, decrease spasticity, and decrease the progression of contractures.     Time  4    Period  Weeks    Status  New      PT LONG TERM GOAL  #10   TITLE  Pt will maintain ability to stand with RUE support x 5 mins at min A level for LE stretching, for improved bowel, bladder and respiratory function.      Time  4    Period  Weeks    Status  New            Plan - 08/05/17 1023    Clinical Impression Statement  Skilled session focused on review/cargiver education for stretching program for HEP.  Pt performed standing balance at side of mat (// bars unavailable as occupied) and gait training was attempted with assistance of pt's husband & caregiver (no staff available for assist) .  Pt  had much difficulty advancing LLE in swing with foot drop noted; no orthotics used as pt's husband states they are not using AFO's at home and also due to time contraint.     Rehab Potential  Fair    Clinical Impairments Affecting Rehab Potential  severity of deficits with previous extensive therapies    PT Frequency  1x / week    PT Duration  12 weeks    PT Treatment/Interventions  ADLs/Self Care Home Management;Therapeutic exercise;Therapeutic activities;Functional mobility training;Gait training;Balance training;Neuromuscular re-education;Patient/family education;Orthotic Fit/Training;Passive range of motion    PT Next Visit Plan  Cont LE stretching, standing/gait as assistance is available       Patient will benefit from skilled therapeutic intervention in order to improve  the following deficits and impairments:  Abnormal gait, Decreased activity tolerance, Decreased balance, Decreased cognition, Decreased coordination, Decreased mobility, Decreased range of motion, Decreased endurance, Hypomobility, Decreased strength, Impaired flexibility, Increased muscle spasms, Impaired UE functional use, Postural dysfunction, Pain  Visit Diagnosis: Other abnormalities of gait and mobility  Muscle weakness (generalized)  Other symptoms and signs involving the nervous system     Problem List Patient Active Problem List   Diagnosis Date Noted  . Coagulase negative Staphylococcus bacteremia 02/09/2017  . Abdominal distension   . Chronic mucus hypersecretion, respiratory 12/31/2016  . Severe protein-calorie malnutrition [name]: less than 60% of standard weight) ([place]) 12/31/2016  . Acute urinary retention 12/31/2016  . Constipation 12/31/2016  . Shortness of breath   . Respiratory distress   . Pneumonia 12/05/2016  . Acute respiratory failure ([place]) 12/05/2016  . Pseudomonas urinary tract infection 10/27/2016  . Severe muscle deconditioning 09/30/2016  . Pressure injury of skin 09/28/2016  . Respiratory failure ([place])   . Tracheobronchitis   . Urinary retention   . PEG (percutaneous endoscopic gastrostomy) status ([place])   . Benign essential HTN   . Hyponatremia   . Hypokalemia   . Reactive hypertension   .  Respiratory insufficiency 09/24/2016  . Malignant neoplasm of upper-inner quadrant of left breast in female, estrogen receptor positive ([place]) 07/14/2016  . Heterotopic ossification of bone 03/10/2016  . Acute on chronic respiratory failure with hypoxia ([place])   . Acute hypoxemic respiratory failure ([place])   . Acute blood loss anemia   . Atelectasis 02/04/2016  . Debility 02/03/2016  . Pneumonia of both lower lobes due to Pseudomonas species ([place])   . Pneumonia of both lower lobes due to methicillin susceptible Staphylococcus aureus (MSSA) ([place])   . Acute  respiratory failure with hypoxia ([place])   . Acute encephalopathy   . Seizures ([place])   . Sepsis ([place])   . Hypoxia   . Pain   . Chronic respiratory failure ([place]) 12/28/2015  . Acute tracheobronchitis 12/24/2015  . Tracheostomy tube present ([place])   . Tracheal stenosis   . Chronic respiratory failure with hypoxia ([place])   . Spastic tetraplegia ([place]) 10/20/2015  . Tracheostomy status ([place]) 09/26/2015  . Allergic rhinitis 09/26/2015  . 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 Movement disorder 09/22/2015  . Encephalitis 09/22/2015  . Chest pain 02/07/2014  . Premature atrial contractions 01/13/2014  . Abnormality of gait 12/04/2013  . Hyperlipidemia 12/21/2011  . Cardiovascular risk factor 12/21/2011  . Bunion 01/31/2008  . Metatarsalgia of both feet 09/20/2007  . FLAT FOOT 09/20/2007  . HALLUX RIGIDUS, ACQUIRED 09/20/2007    Alda Lea, PT 08/05/2017, 10:31 AM  [facility] [address] [place], [place], [address] Phone: [phone]   Fax:  [phone]  Name: Judy Spears MRN: [id number] Date of Birth: 1950-07-30

## 2017-08-08 ENCOUNTER — Ambulatory Visit: Payer: Medicare Other | Admitting: Occupational Therapy

## 2017-08-08 ENCOUNTER — Encounter: Payer: Self-pay | Admitting: Occupational Therapy

## 2017-08-08 DIAGNOSIS — R293 Abnormal posture: Secondary | ICD-10-CM

## 2017-08-08 DIAGNOSIS — R2689 Other abnormalities of gait and mobility: Secondary | ICD-10-CM

## 2017-08-08 DIAGNOSIS — M25612 Stiffness of left shoulder, not elsewhere classified: Secondary | ICD-10-CM

## 2017-08-08 DIAGNOSIS — R278 Other lack of coordination: Secondary | ICD-10-CM

## 2017-08-08 DIAGNOSIS — G8250 Quadriplegia, unspecified: Secondary | ICD-10-CM

## 2017-08-08 DIAGNOSIS — R41844 Frontal lobe and executive function deficit: Secondary | ICD-10-CM

## 2017-08-08 DIAGNOSIS — M25611 Stiffness of right shoulder, not elsewhere classified: Secondary | ICD-10-CM

## 2017-08-08 DIAGNOSIS — R2681 Unsteadiness on feet: Secondary | ICD-10-CM

## 2017-08-08 DIAGNOSIS — M6281 Muscle weakness (generalized): Secondary | ICD-10-CM

## 2017-08-08 DIAGNOSIS — R29818 Other symptoms and signs involving the nervous system: Secondary | ICD-10-CM

## 2017-08-08 DIAGNOSIS — M25622 Stiffness of left elbow, not elsewhere classified: Secondary | ICD-10-CM

## 2017-08-08 NOTE — Therapy (Addendum)
[facility] [address] [place], [place], [address] Phone: [phone]   Fax:  [phone]  Occupational Therapy Treatment  Patient Details  Name: Judy Spears MRN: [id number] Date of Birth: [birth year] Referring Provider: Dr. Levada Schilling   Encounter Date: 08/08/2017  OT End of Session - 08/08/17 1254    Visit Number  9    Number of Visits  24    Date for OT Re-Evaluation  08/03/17    Authorization Type  medicare maintenance program - pt to be reassessed every 30 days per Medicare guidelines  Will need PN every 10th visit    Authorization Time Period  90 days - 09/28/2017    OT Start Time  1147    OT Stop Time  1230    OT Time Calculation (min)  43 min    Activity Tolerance  Patient tolerated treatment well       Past Medical History:  Diagnosis Date  . Arthritis    lt great toe  . Complication of anesthesia    pt has had headaches post op-did advise to hydra well preop  . Hair loss    right-sided  . History of exercise stress test    a. ETT (12/15) with 12:00 exercise, no ST changes, occasional PACs.   . Hyperlipidemia   . Insomnia   . Migraine headache   . Movement disorder    resltess in left legs  . Postmenopausal   . 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 Premature atrial contractions    a. Holter (12/15) with 8% PACs, no atrial runs or atrial fibrillation.     Past Surgical History:  Procedure Laterality Date  . BREAST BIOPSY  01/01/2011   Procedure: BREAST BIOPSY WITH NEEDLE LOCALIZATION;  Surgeon: Rolm Bookbinder, MD;  Location: [facility];  Service: General;  Laterality: Left;  left breast wire localization biopsy  . BREAST BIOPSY Right 07/27/2012  . BREAST BIOPSY Right 07/07/2016  . BREAST BIOPSY Left 06/30/2016  . cataracts right eye    . CESAREAN SECTION  1986  . HERNIA REPAIR  2000   RIH  . INTRAMEDULLARY (IM) NAIL INTERTROCHANTERIC Left 01/30/2017   Procedure: ORIF  AFFIXUS NAIL;  Surgeon: Paralee Cancel, MD;  Location: [facility];  Service: Orthopedics;  Laterality: Left;  . IR REPLC GASTRO/COLONIC TUBE PERCUT W/FLUORO  09/29/2016  . opirectinal membrane peel    . PEG PLACEMENT    . RHINOPLASTY  1983  . TRACHEOSTOMY    . TRACHEOSTOMY TUBE PLACEMENT N/A 12/15/2016   Procedure: TRACHEOSTOMY CHANGE;  Surgeon: [name] Montane, MD;  Location: [facility];  Service: ENT;  Laterality: N/A;    There were no vitals filed for this visit.  Subjective Assessment - 08/08/17 1238    Subjective   Nodding yes when asked if she was ready to stand    Patient is accompained by:  Family member husband [name] and caregiver Leesa    Pertinent History  see epic snapshot - ABI/encephalitis/hypoxia from virus; recent hospitalization due to MRSA PNA     Patient Stated Goals  Pt unable to state.     Currently in Pain?  No/denies                   OT Treatments/Exercises (OP) - 08/08/17 0001      ADLs   ADL Comments  Husband and caregiver came today with custom over the counter brace for RUE - caregiver [name] concerned as there are  several places that metal has come through brace and caregiver and husband report that pt has not been wearing at home for some time now.  Husband expressed concern that pt postures in full flexion when sleeping.  Initially on evaluation 2 years ago pt had 130* of elbow extension.  At that time, family and caregiver educated in [place] and stated they had splints from [facility] but that they were hard to get on pt.  Physiatry at that time recommended Jazz splint which pt did not tolerate well ( would inconsistently wear 20 minutes 2-3 times per day). Pt has experienced multiple medical complications over the past two years contributing to current contracture. Pt also has h/o of pathological fractures (3 in pelvis, R hip and L hip) and therefore aggressive ROM would be contraindicated.  Pt has gradually lost range of motion and currently has 91* of passive  extension. Per caregiver report, stretching program at home has been done inconsistently and pt has not tolerated splinting well.  WIth manual PROM feel elbow has bony block at this time.  Husband to make appointment with orthopedist for recommendations to prevent further contracture. Will await recommendations regarding this from MD.  Offer to make bivalve cast for night wear and husand would prefer her to see orthopedist first.       Neurological Re-education Exercises   Other Exercises 1  Neuro re ed to address trunk rotation and elongation of R side in sittng prior to sit to stand.  Addressed sit to stand in barefeet - pt able to get both feet flat on floor with signficant postural malalignment.  Addressed active weight shfiting, stepping and trunk control with max faciltation and max a x 2 in parellel bars - pt alert today however with poor ability to follow one step commands today.                    OT Long Term Goals - 08/08/17 1252      OT LONG TERM GOAL #1   Title  Pt will maintain ability to roll in bed during self care tasks with no more than max a x1 - reasssess 08/31/2017    Status  On-going      OT LONG TERM GOAL #2   Title  Pt will maintain the ability to use roll in shower chair with care giver assistance     Status  On-going      OT LONG TERM GOAL #3   Title  Pt will maintain ability to transfer to toilet with no more than max a x1 using Steady Assist - 10/06/2017      OT LONG TERM GOAL #4   Title  Pt wil maintain ability for dressing at max a x1.     Status  On-going      OT LONG TERM GOAL #5   Title  Pt will maintain ability to stand with max a x1 to for clothing mgmt with toileting.     Status  On-going      OT LONG TERM GOAL #6   Title  Pt will maintain ability to participate in functional transfers, as well as stand to sit and sit to stand with no more than max a x1    Status  On-going      OT LONG TERM GOAL #7   Title  Pt will maintain functional  mobility and trunk control to aid in her ability to clear sucretions and assist with phonation to express basic  needs/wants    Status  On-going            Plan - 08/08/17 1253    Clinical Impression Statement  Pt alert but with poor ability to follow one step commands today.  Pt continues to maintain current assist levels per goals.     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    Occupational performance deficits (Please refer to evaluation for details):  ADL's;IADL's;Rest and Sleep;Leisure;Social Participation;Work    Neurosurgeon    Current Impairments/barriers affecting progress:  severity of multi system deficits, intermittent medical issues    OT Frequency  2x / week    OT Duration  12 weeks    OT Treatment/Interventions  Self-care/ADL training;Ultrasound;Moist Heat;Electrical Stimulation;DME and/or AE instruction;Neuromuscular education;Therapeutic exercise;Functional Mobility Training;Manual Therapy;Passive range of motion;Splinting;Therapeutic activities;Balance training;Patient/family education;Visual/perceptual remediation/compensation;Cognitive remediation/compensation    Plan  address rigidity and tone in trunk and extremeties, NMR trunk and LE's for transitional movements, static standing, sitting balance, sit to stand, stand to sit, stepping;     Consulted and Agree with Plan of Care  Patient;Family member/caregiver    Family Member Consulted  husband [name], caregiver Leesa       Patient will benefit from skilled therapeutic intervention in order to improve the following deficits and impairments:  Decreased endurance, Decreased skin integrity, Impaired vision/preception, Improper body mechanics, Impaired perceived functional ability, Decreased  knowledge of precautions, Cardiopulmonary status limiting activity, Decreased activity tolerance, Decreased knowledge of use of DME, Decreased strength, Impaired flexibility, Improper spinal/pelvic alignment, Impaired sensation, Difficulty walking, Decreased mobility, Decreased balance, Decreased cognition, Decreased range of motion, Impaired tone, Impaired UE functional use, Decreased safety awareness, Decreased coordination, Pain, Abnormal gait  Visit Diagnosis: Other abnormalities of gait and mobility  Muscle weakness (generalized)  Other symptoms and signs involving the nervous system  Abnormal posture  Unsteadiness on feet  Frontal lobe and executive function deficit  Other lack of coordination  Stiffness of right shoulder, not elsewhere classified  Stiffness of left shoulder, not elsewhere classified  Stiffness of left elbow, not elsewhere classified  Spastic tetraplegia [facility])    Problem List Patient Active Problem List   Diagnosis Date Noted  . Coagulase negative Staphylococcus bacteremia 02/09/2017  . Abdominal distension   . Chronic mucus hypersecretion, respiratory 12/31/2016  . Severe protein-calorie malnutrition [name]: less than 60% of standard weight) ([place]) 12/31/2016  . Acute urinary retention 12/31/2016  . Constipation 12/31/2016  . Shortness of breath   . Respiratory distress   . Pneumonia 12/05/2016  . Acute respiratory failure ([place]) 12/05/2016  . Pseudomonas urinary tract infection 10/27/2016  . Severe muscle deconditioning 09/30/2016  . Pressure injury of skin 09/28/2016  . Respiratory failure ([place])   . Tracheobronchitis   . Urinary retention   . PEG (percutaneous endoscopic gastrostomy) status ([place])   . Benign essential HTN   . Hyponatremia   . Hypokalemia   . Reactive hypertension   . Respiratory insufficiency 09/24/2016  . Malignant neoplasm of upper-inner quadrant of left breast in female, estrogen receptor positive ([place]) 07/14/2016  .  Heterotopic ossification of bone 03/10/2016  . Acute on chronic respiratory failure with hypoxia ([place])   . Acute hypoxemic respiratory failure ([place])   . Acute blood loss anemia   . Atelectasis 02/04/2016  . Debility 02/03/2016  . Pneumonia of both  lower lobes due to Pseudomonas species ([place])   . Pneumonia of both lower lobes due to methicillin susceptible Staphylococcus aureus (MSSA) ([place])   . Acute respiratory failure with hypoxia ([place])   . Acute encephalopathy   . Seizures ([place])   . Sepsis ([place])   . Hypoxia   . Pain   . Chronic respiratory failure ([place]) 12/28/2015  . Acute tracheobronchitis 12/24/2015  . Tracheostomy tube present ([place])   . Tracheal stenosis   . Chronic respiratory failure with hypoxia ([place])   . Spastic tetraplegia ([place]) 10/20/2015  . Tracheostomy status ([place]) 09/26/2015  . Allergic rhinitis 09/26/2015  . 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 Movement disorder 09/22/2015  . Encephalitis 09/22/2015  . Chest pain 02/07/2014  . Premature atrial contractions 01/13/2014  . Abnormality of gait 12/04/2013  . Hyperlipidemia 12/21/2011  . Cardiovascular risk factor 12/21/2011  . Bunion 01/31/2008  . Metatarsalgia of both feet 09/20/2007  . FLAT FOOT 09/20/2007  . HALLUX RIGIDUS, ACQUIRED 09/20/2007   30 day progress update (service dates 07/06/2017 - 08/03/2017):  Pt currently on maintenance therapy program as pt requires advanced skilled therapeutic intervention in order to maintain current levels of assistance for self care and functional mobility.  Pt has maintained current level per goals - see goals for details. Will continue to address current ongoing goals and educate family and caregiver as possible for various activities.    Quay Burow, OTR/L 08/08/2017, 12:56 PM  [facility] [address] [place], [place], [address] Phone: [phone]   Fax:  [phone]  Name: Judy Spears MRN: [id number] Date of Birth: 27-Sep-1950

## 2017-08-10 ENCOUNTER — Ambulatory Visit: Payer: Medicare Other

## 2017-08-10 ENCOUNTER — Ambulatory Visit: Payer: Medicare Other | Admitting: Occupational Therapy

## 2017-08-10 DIAGNOSIS — R293 Abnormal posture: Secondary | ICD-10-CM

## 2017-08-10 DIAGNOSIS — M6281 Muscle weakness (generalized): Secondary | ICD-10-CM

## 2017-08-10 DIAGNOSIS — R29818 Other symptoms and signs involving the nervous system: Secondary | ICD-10-CM

## 2017-08-10 DIAGNOSIS — R2681 Unsteadiness on feet: Secondary | ICD-10-CM

## 2017-08-10 DIAGNOSIS — R41844 Frontal lobe and executive function deficit: Secondary | ICD-10-CM

## 2017-08-10 DIAGNOSIS — R1312 Dysphagia, oropharyngeal phase: Secondary | ICD-10-CM

## 2017-08-10 DIAGNOSIS — R471 Dysarthria and anarthria: Secondary | ICD-10-CM

## 2017-08-10 DIAGNOSIS — R41841 Cognitive communication deficit: Secondary | ICD-10-CM

## 2017-08-10 DIAGNOSIS — R4701 Aphasia: Secondary | ICD-10-CM

## 2017-08-10 DIAGNOSIS — R278 Other lack of coordination: Secondary | ICD-10-CM

## 2017-08-10 NOTE — Therapy (Signed)
[facility] [address] [place], [place], [address] Phone: [phone]   Fax:  [phone]  Occupational Therapy Treatment  Patient Details  Name: Judy Spears MRN: [id number] Date of Birth: [birth year] Referring Provider: Dr. Levada Schilling   Encounter Date: 08/10/2017  OT End of Session - 08/10/17 1554    Visit Number  10    Number of Visits  24    Date for OT Re-Evaluation  08/03/17    Authorization Type  medicare maintenance program - pt to be reassessed every 30 days per Medicare guidelines  Will need PN every 10th visit    Authorization Time Period  90 days - 09/28/2017    OT Start Time  1403    OT Stop Time  1445    OT Time Calculation (min)  42 min    Activity Tolerance  Patient tolerated treatment well    Behavior During Therapy  [facility] for tasks assessed/performed       Past Medical History:  Diagnosis Date  . Arthritis    lt great toe  . Complication of anesthesia    pt has had headaches post op-did advise to hydra well preop  . Hair loss    right-sided  . History of exercise stress test    a. ETT (12/15) with 12:00 exercise, no ST changes, occasional PACs.   . Hyperlipidemia   . Insomnia   . Migraine headache   . Movement disorder    resltess in left legs  . Postmenopausal   . 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 Premature atrial contractions    a. Holter (12/15) with 8% PACs, no atrial runs or atrial fibrillation.     Past Surgical History:  Procedure Laterality Date  . BREAST BIOPSY  01/01/2011   Procedure: BREAST BIOPSY WITH NEEDLE LOCALIZATION;  Surgeon: Rolm Bookbinder, MD;  Location: [facility];  Service: General;  Laterality: Left;  left breast wire localization biopsy  . BREAST BIOPSY Right 07/27/2012  . BREAST BIOPSY Right 07/07/2016  . BREAST BIOPSY Left 06/30/2016  . cataracts right eye    . CESAREAN SECTION  1986  . HERNIA REPAIR  2000   RIH  . INTRAMEDULLARY  (IM) NAIL INTERTROCHANTERIC Left 01/30/2017   Procedure: ORIF AFFIXUS NAIL;  Surgeon: Paralee Cancel, MD;  Location: [facility];  Service: Orthopedics;  Laterality: Left;  . IR REPLC GASTRO/COLONIC TUBE PERCUT W/FLUORO  09/29/2016  . opirectinal membrane peel    . PEG PLACEMENT    . RHINOPLASTY  1983  . TRACHEOSTOMY    . TRACHEOSTOMY TUBE PLACEMENT N/A 12/15/2016   Procedure: TRACHEOSTOMY CHANGE;  Surgeon: [name] Montane, MD;  Location: [facility];  Service: ENT;  Laterality: N/A;    There were no vitals filed for this visit.  Subjective Assessment - 08/10/17 1548    Subjective   Doesn't matter    Patient is accompained by:  -- caregiver [name]    Pertinent History  see epic snapshot - ABI/encephalitis/hypoxia from virus; recent hospitalization due to MRSA PNA     Patient Stated Goals  Pt unable to state.     Currently in Pain?  No/denies    Pain Score  0-No pain                   OT Treatments/Exercises (OP) - 08/10/17 0001      Neurological Re-education Exercises   Other Exercises 1  Neuromuscular reeducation to address trunk lengthening to aide with rolling,  sit to stand, and standing.  Patient able to lie on left side on wedge - and counter rotate to allow expansion througout anterior trunk.  Patient had massage earlier today - and massage focused on lengthening left trunk.  Patient able to tolerate being on left side and even on back better today than in prior episodes.  Patient overall more lethargic today.  Per caregiver she had sedation yesterday to allow for MRI.  Will need to continue to monitor arousal.       Other Exercises 2  Seated on ball - working on lower trunk initiated movement and transition from sit to partial stand.                 OT Short Term Goals - 07/14/17 1433      OT SHORT TERM GOAL #1   Title  --    Status  --      OT SHORT TERM GOAL #2   Title  --    Status  --      OT SHORT TERM GOAL #6   Title  --      OT SHORT TERM GOAL #7   Title  --         OT Long Term Goals - 08/08/17 1252      OT LONG TERM GOAL #1   Title  Pt will maintain ability to roll in bed during self care tasks with no more than max a x1 - reasssess 08/31/2017    Status  On-going      OT LONG TERM GOAL #2   Title  Pt will maintain the ability to use roll in shower chair with care giver assistance     Status  On-going      OT LONG TERM GOAL #3   Title  Pt will maintain ability to transfer to toilet with no more than max a x1 using Steady Assist - 10/06/2017      OT LONG TERM GOAL #4   Title  Pt wil maintain ability for dressing at max a x1.     Status  On-going      OT LONG TERM GOAL #5   Title  Pt will maintain ability to stand with max a x1 to for clothing mgmt with toileting.     Status  On-going      OT LONG TERM GOAL #6   Title  Pt will maintain ability to participate in functional transfers, as well as stand to sit and sit to stand with no more than max a x1    Status  On-going      OT LONG TERM GOAL #7   Title  Pt will maintain functional mobility and trunk control to aid in her ability to clear sucretions and assist with phonation to express basic needs/wants    Status  On-going            Plan - 08/10/17 1555    Clinical Impression Statement  Patient less alert today, had sedation yesterday for MRI- have not seen results.      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    Occupational performance deficits (Please refer to evaluation for details):  ADL's;IADL's;Rest and Sleep;Leisure;Social Participation;Work    Rehab Potential  Good    Current Impairments/barriers affecting progress:  severity of  multi system deficits, intermittent medical issues    OT Frequency  2x / week    OT Duration  12 weeks    OT  Treatment/Interventions  Self-care/ADL training;Ultrasound;Moist Heat;Electrical Stimulation;DME and/or AE instruction;Neuromuscular education;Therapeutic exercise;Functional Mobility Training;Manual Therapy;Passive range of motion;Splinting;Therapeutic activities;Balance training;Patient/family education;Visual/perceptual remediation/compensation;Cognitive remediation/compensation    Plan  address rigidity and tone in trunk and extremeties, NMR trunk and LE's for transitional movements, static standing, sitting balance, sit to stand, stand to sit, stepping;     Consulted and Agree with Plan of Care  Patient;Family member/caregiver    Family Member Consulted  caregiver Leesa       Patient will benefit from skilled therapeutic intervention in order to improve the following deficits and impairments:  Decreased endurance, Decreased skin integrity, Impaired vision/preception, Improper body mechanics, Impaired perceived functional ability, Decreased knowledge of precautions, Cardiopulmonary status limiting activity, Decreased activity tolerance, Decreased knowledge of use of DME, Decreased strength, Impaired flexibility, Improper spinal/pelvic alignment, Impaired sensation, Difficulty walking, Decreased mobility, Decreased balance, Decreased cognition, Decreased range of motion, Impaired tone, Impaired UE functional use, Decreased safety awareness, Decreased coordination, Pain, Abnormal gait  Visit Diagnosis: Abnormal posture  Other lack of coordination  Muscle weakness (generalized)  Unsteadiness on feet  Frontal lobe and executive function deficit  Other symptoms and signs involving the nervous system    Problem List Patient Active Problem List   Diagnosis Date Noted  . Coagulase negative Staphylococcus bacteremia 02/09/2017  . Abdominal distension   . Chronic mucus hypersecretion, respiratory 12/31/2016  . Severe protein-calorie malnutrition [name]: less than 60% of standard weight) ([place])  12/31/2016  . Acute urinary retention 12/31/2016  . Constipation 12/31/2016  . Shortness of breath   . Respiratory distress   . Pneumonia 12/05/2016  . Acute respiratory failure ([place]) 12/05/2016  . Pseudomonas urinary tract infection 10/27/2016  . Severe muscle deconditioning 09/30/2016  . Pressure injury of skin 09/28/2016  . Respiratory failure ([place])   . Tracheobronchitis   . Urinary retention   . PEG (percutaneous endoscopic gastrostomy) status ([place])   . Benign essential HTN   . Hyponatremia   . Hypokalemia   . Reactive hypertension   . Respiratory insufficiency 09/24/2016  . Malignant neoplasm of upper-inner quadrant of left breast in female, estrogen receptor positive ([place]) 07/14/2016  . Heterotopic ossification of bone 03/10/2016  . Acute on chronic respiratory failure with hypoxia ([place])   . Acute hypoxemic respiratory failure ([place])   . Acute blood loss anemia   . Atelectasis 02/04/2016  . Debility 02/03/2016  . Pneumonia of both lower lobes due to Pseudomonas species ([place])   . Pneumonia of both lower lobes due to methicillin susceptible Staphylococcus aureus (MSSA) ([place])   . Acute respiratory failure with hypoxia ([place])   . Acute encephalopathy   . Seizures ([place])   . Sepsis ([place])   . Hypoxia   . Pain   . Chronic respiratory failure ([place]) 12/28/2015  . Acute tracheobronchitis 12/24/2015  . Tracheostomy tube present ([place])   . Tracheal stenosis   . Chronic respiratory failure with hypoxia ([place])   . Spastic tetraplegia ([place]) 10/20/2015  . Tracheostomy status ([place]) 09/26/2015  . Allergic rhinitis 09/26/2015  . 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 Movement disorder 09/22/2015  . Encephalitis 09/22/2015  . Chest pain 02/07/2014  . Premature atrial contractions 01/13/2014  . Abnormality of gait 12/04/2013  . Hyperlipidemia 12/21/2011  . Cardiovascular risk factor 12/21/2011  . Bunion 01/31/2008  . Metatarsalgia of both feet 09/20/2007  . FLAT  FOOT  09/20/2007  . HALLUX RIGIDUS, ACQUIRED 09/20/2007    Mariah Milling, OTR/L 08/10/2017, 4:00 PM  [facility] [address] [place], [place], [address] Phone: [phone]   Fax:  [phone]  Name: Judy Spears MRN: [id number] Date of Birth: 1950-07-20

## 2017-08-10 NOTE — Therapy (Signed)
[facility] [address] [place], [place], [address] Phone: [phone]   Fax:  [phone]  Speech Language Pathology Treatment  Patient Details  Name: Judy Spears MRN: [id number] Date of Birth: 04-29-1950 Referring Provider: Levada Schilling, MD   Encounter Date: 08/10/2017  End of Session - 08/10/17 1718    Visit Number  18    Number of Visits  21    Date for SLP Re-Evaluation  09/09/17       Past Medical History:  Diagnosis Date  . Arthritis    lt great toe  . Complication of anesthesia    pt has had headaches post op-did advise to hydra well preop  . Hair loss    right-sided  . History of exercise stress test    a. ETT (12/15) with 12:00 exercise, no ST changes, occasional PACs.   . Hyperlipidemia   . Insomnia   . Migraine headache   . Movement disorder    resltess in left legs  . Postmenopausal   . 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 Premature atrial contractions    a. Holter (12/15) with 8% PACs, no atrial runs or atrial fibrillation.     Past Surgical History:  Procedure Laterality Date  . BREAST BIOPSY  01/01/2011   Procedure: BREAST BIOPSY WITH NEEDLE LOCALIZATION;  Surgeon: Rolm Bookbinder, MD;  Location: [facility];  Service: General;  Laterality: Left;  left breast wire localization biopsy  . BREAST BIOPSY Right 07/27/2012  . BREAST BIOPSY Right 07/07/2016  . BREAST BIOPSY Left 06/30/2016  . cataracts right eye    . CESAREAN SECTION  1986  . HERNIA REPAIR  2000   RIH  . INTRAMEDULLARY (IM) NAIL INTERTROCHANTERIC Left 01/30/2017   Procedure: ORIF AFFIXUS NAIL;  Surgeon: Paralee Cancel, MD;  Location: [facility];  Service: Orthopedics;  Laterality: Left;  . IR REPLC GASTRO/COLONIC TUBE PERCUT W/FLUORO  09/29/2016  . opirectinal membrane peel    . PEG PLACEMENT    . RHINOPLASTY  1983  . TRACHEOSTOMY    . TRACHEOSTOMY TUBE PLACEMENT N/A 12/15/2016   Procedure: TRACHEOSTOMY  CHANGE;  Surgeon: [name] Montane, MD;  Location: [facility];  Service: ENT;  Laterality: N/A;    There were no vitals filed for this visit.  Subjective Assessment - 08/10/17 1703    Subjective  Pt with pharyngeal/laryngeal congestion today, reduced awareness. DId not greet SLP. Pt had MRI yesterday with sedation.    Patient is accompained by:  -- [name]    Currently in Pain?  No/denies            ADULT SLP TREATMENT - 08/10/17 1708      General Information   Behavior/Cognition  Alert;Cooperative;Lethargic      Treatment Provided   Treatment provided  Cognitive-Linquistic      Cognitive-Linquistic Treatment   Treatment focused on  Dysarthria;Patient/family/caregiver education    Skilled Treatment  Pt seen in gym for first part of ST session; pt was aroused for 70% of session, requiring verbal and tactile cues for alertness in last 15-20 minutes of session. Pt with much pharyngeal/laryngeal congestion today - a "wet" voice persisted throughout the entire session unable to be cleared by spontaneous or cued swallows. In fact, pt was unable to achieve any consistency with voluntary swallow requested by SLP today. Pt with voicing in only approx 10% of utterances despite SLP usual cues for "Breathe, shout, move your mouth." Intelligibility was <10% mainly due to minimal voicing  today. Pt unable to obtain functional voicing in sitting position. SLP worked with pt on 1-3 syllable words. Last ten minutes was spent in consultation with aide, [name], and pt (for first 4 minutes and then pt could not be aroused). SLP educated aide that she should attempt dysarthria exercises when possible - target should be approx 15-20 minutes of work. Until that time, pt will need consistent focus on meeting the compensations "Breathe, loud, move your mouth." as her stamina will likely not allow 20 minutes of work with oral -motor musculature.       Assessment / Recommendations / Plan   Plan  Continue with current plan of  care      Progression Toward Goals   Progression toward goals  Goals updated       SLP Education - 08/10/17 1717    Education provided  Yes    Education Details  drill compensations for dysarthria in addition to working with building stamina with oral motor exercises up to 20 minutes    Person(s) Educated  Theatre stage manager);Patient    Methods  Explanation    Comprehension  Verbalized understanding;Need further instruction aide- verbalized, pt- needs instruction   aide- verbalized, pt- needs instruction      SLP Short Term Goals - 06/27/17 1750      SLP SHORT TERM GOAL #1   Title  Pt will demonstrate dysarthria HEP to increase articulatory coordination for speech with usual mod A over 3 sessions    Status  Deferred      SLP SHORT TERM GOAL #2   Title  Pt will achieve phonation adequate for SLP intelligibility in imitated two-syllable words using intelligibility compensations 12/20 trials with usual min A in 3 sessions    Status  Not Met      SLP SHORT TERM GOAL #3   Title  Pt will imitate 2-syllable words or phrases with functional intelligibility for SLP in 5/15 trials over 3 sessions     Status  Not Met      SLP SHORT TERM GOAL #4   Title  Pt will sustain attention to therapy tasks for 10 minutes with multimodal cues in 5 sessions    Status  Partially Met      SLP SHORT TERM GOAL #5   Title  pt family will demo understanding of oral care regimen, including safe ingestion of water/ice chips over three sessions    Status  Achieved      SLP SHORT TERM GOAL #6   Title  pt will demo dysphagia HEP to improve management of secretions and safety with any possible POs    Status  Deferred      SLP SHORT TERM GOAL #7   Title  family/caregivers will demo understanding of pt swallowing precautions, for pt safety with POs, by verbal report     Status  Achieved       SLP Long Term Goals - 08/10/17 1720      SLP LONG TERM GOAL #1   Title  Pt will demo dysarthria  HEP to incr  articulatory coordination for speech, with occasional mod A over three sessions    Status  Not Met      SLP LONG TERM GOAL #2   Title  pt will achieve phonation adequate for SLP intelligibility in imitated 3-syllable words/3-word phrases using intelligibility compenastions 15/20 trials    Time  4    Period  Weeks    Status  Partially Met and ongoing  SLP LONG TERM GOAL #3   Title  Pt will imitate 2-syllable words or phrases with functional intelligibility for SLP in 8/15 trials over three sessions    Status  Partially Met 1/3 session      SLP LONG TERM GOAL #4   Title  pt will sustain attention to therapy tasks for 10 minutes with multimodal cues in 10 sessions    Baseline  06-10-17, 07-04-17, 07-06-17; 07-12-17, 07-14-17, 08-10-17    Time  4    Period  Weeks    Status  Partially Met and ongoing      SLP LONG TERM GOAL #5   Title  pt will demo dysphagia HEP to improve management of secretions and safety with possible POs    Status  Deferred      Additional Long Term Goals   Additional Long Term Goals  Yes      SLP LONG TERM GOAL #6   Title  pt's family will demo knowledge about how best to work with pt speech/language/swallow deficits in each session over the next four weeks    Time  4    Period  Weeks    Status  New       Plan - 08/10/17 1719    Clinical Impression Statement  Patient presents with ongoing deficits in speech intelligibility (dysarthria, reduced breath support), cognitive communication and swallowing. SLP to transition to more of a consultant role for goals to be written for sessions beginning on/after 07-18-17, with pt decr'ing frequency to x1/week x4 weeks. Husband completely in agreement with this plan. See skilled treatment for details of voicing, functional speech intelligilibty for imitation and spontaneous production of 1-4 syllable words/phrases.     Speech Therapy Frequency  1x /week    Duration  4 weeks    Treatment/Interventions  Aspiration precaution  training;Trials of upgraded texture/liquids;Compensatory strategies;Functional tasks;Patient/family education;Cueing hierarchy;Diet toleration management by SLP;Cognitive reorganization;Compensatory techniques;SLP instruction and feedback;Internal/external aids;Multimodal communcation approach;Pharyngeal strengthening exercises;Oral motor exercises    Potential to Achieve Goals  Fair    Potential Considerations  Ability to learn/carryover information;Co-morbidities;Severity of impairments;Cooperation/participation level;Previous level of function    Consulted and Agree with Plan of Care  Patient;Family member/caregiver    Family Member Consulted  Husband, [name]       Patient will benefit from skilled therapeutic intervention in order to improve the following deficits and impairments:   Dysarthria and anarthria  Cognitive communication deficit  Dysphagia, oropharyngeal phase  Aphasia    Problem List Patient Active Problem List   Diagnosis Date Noted  . Coagulase negative Staphylococcus bacteremia 02/09/2017  . Abdominal distension   . Chronic mucus hypersecretion, respiratory 12/31/2016  . Severe protein-calorie malnutrition [name]: less than 60% of standard weight) ([facility]) 12/31/2016  . Acute urinary retention 12/31/2016  . Constipation 12/31/2016  . Shortness of breath   . Respiratory distress   . Pneumonia 12/05/2016  . Acute respiratory failure ([facility]) 12/05/2016  . Pseudomonas urinary tract infection 10/27/2016  . Severe muscle deconditioning 09/30/2016  . Pressure injury of skin 09/28/2016  . Respiratory failure ([facility])   . Tracheobronchitis   . Urinary retention   . PEG (percutaneous endoscopic gastrostomy) status ([facility])   . Benign essential HTN   . Hyponatremia   . Hypokalemia   . Reactive hypertension   . Respiratory insufficiency 09/24/2016  . Malignant neoplasm of upper-inner quadrant of left breast in female, estrogen receptor positive ([place]) 07/14/2016  . Heterotopic  ossification of bone 03/10/2016  . Acute on chronic  respiratory failure with hypoxia ([facility])   . Acute hypoxemic respiratory failure ([facility])   . Acute blood loss anemia   . Atelectasis 02/04/2016  . Debility 02/03/2016  . Pneumonia of both lower lobes due to Pseudomonas species ([facility])   . Pneumonia of both lower lobes due to methicillin susceptible Staphylococcus aureus (MSSA) ([place])   . Acute respiratory failure with hypoxia ([place])   . Acute encephalopathy   . Seizures ([place])   . Sepsis ([place])   . Hypoxia   . Pain   . Chronic respiratory failure ([place]) 12/28/2015  . Acute tracheobronchitis 12/24/2015  . Tracheostomy tube present ([place])   . Tracheal stenosis   . Chronic respiratory failure with hypoxia ([place])   . Spastic tetraplegia ([place]) 10/20/2015  . Tracheostomy status ([place]) 09/26/2015  . Allergic rhinitis 09/26/2015  . 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 Movement disorder 09/22/2015  . Encephalitis 09/22/2015  . Chest pain 02/07/2014  . Premature atrial contractions 01/13/2014  . Abnormality of gait 12/04/2013  . Hyperlipidemia 12/21/2011  . Cardiovascular risk factor 12/21/2011  . Bunion 01/31/2008  . Metatarsalgia of both feet 09/20/2007  . FLAT FOOT 09/20/2007  . HALLUX RIGIDUS, ACQUIRED 09/20/2007    [facility] 08/10/2017, 5:24 PM  [facility] [address] [place], [place], [address] Phone: [phone]   Fax:  [phone]   Name: Judy Spears MRN: [id number] Date of Birth: May 16, 1950

## 2017-08-12 ENCOUNTER — Ambulatory Visit: Payer: Medicare Other | Admitting: Rehabilitation

## 2017-08-12 ENCOUNTER — Encounter: Payer: Self-pay | Admitting: Rehabilitation

## 2017-08-12 DIAGNOSIS — R293 Abnormal posture: Secondary | ICD-10-CM | POA: Diagnosis not present

## 2017-08-12 DIAGNOSIS — R2681 Unsteadiness on feet: Secondary | ICD-10-CM

## 2017-08-12 DIAGNOSIS — R2689 Other abnormalities of gait and mobility: Secondary | ICD-10-CM

## 2017-08-12 DIAGNOSIS — R29818 Other symptoms and signs involving the nervous system: Secondary | ICD-10-CM

## 2017-08-12 DIAGNOSIS — M25652 Stiffness of left hip, not elsewhere classified: Secondary | ICD-10-CM

## 2017-08-12 DIAGNOSIS — M6281 Muscle weakness (generalized): Secondary | ICD-10-CM

## 2017-08-12 DIAGNOSIS — M25651 Stiffness of right hip, not elsewhere classified: Secondary | ICD-10-CM

## 2017-08-12 NOTE — Therapy (Signed)
[facility] [address] [place], [place], [address] Phone: [phone]   Fax:  [phone]  Physical Therapy Treatment  Patient Details  Name: Judy Spears MRN: [id number] Date of Birth: [birth year] Referring Provider: Dr. Levada Schilling   Encounter Date: 08/12/2017  PT End of Session - 08/12/17 1331    Visit Number  20    Number of Visits  28    Date for PT Re-Evaluation  10/04/17    Authorization Time Period  07/12/17-10/04/17    PT Start Time  1148    PT Stop Time  1230    PT Time Calculation (min)  42 min    Activity Tolerance  Patient tolerated treatment well    Behavior During Therapy  [facility] for tasks assessed/performed       Past Medical History:  Diagnosis Date  . Arthritis    lt great toe  . Complication of anesthesia    pt has had headaches post op-did advise to hydra well preop  . Hair loss    right-sided  . History of exercise stress test    a. ETT (12/15) with 12:00 exercise, no ST changes, occasional PACs.   . Hyperlipidemia   . Insomnia   . Migraine headache   . 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 Postmenopausal   . 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 Premature atrial contractions    a. Holter (12/15) with 8% PACs, no atrial runs or atrial fibrillation.     Past Surgical History:  Procedure Laterality Date  . BREAST BIOPSY  01/01/2011   Procedure: BREAST BIOPSY WITH NEEDLE LOCALIZATION;  Surgeon: Rolm Bookbinder, MD;  Location: [facility];  Service: General;  Laterality: Left;  left breast wire localization biopsy  . BREAST BIOPSY Right 07/27/2012  . BREAST BIOPSY Right 07/07/2016  . BREAST BIOPSY Left 06/30/2016  . cataracts right eye    . CESAREAN SECTION  1986  . HERNIA REPAIR  2000   RIH  . INTRAMEDULLARY (IM) NAIL INTERTROCHANTERIC Left 01/30/2017   Procedure: ORIF AFFIXUS NAIL;  Surgeon: Paralee Cancel, MD;  Location: [facility];  Service: Orthopedics;   Laterality: Left;  . IR REPLC GASTRO/COLONIC TUBE PERCUT W/FLUORO  09/29/2016  . opirectinal membrane peel    . PEG PLACEMENT    . RHINOPLASTY  1983  . TRACHEOSTOMY    . TRACHEOSTOMY TUBE PLACEMENT N/A 12/15/2016   Procedure: TRACHEOSTOMY CHANGE;  Surgeon: [name] Montane, MD;  Location: [facility];  Service: ENT;  Laterality: N/A;    There were no vitals filed for this visit.  Subjective Assessment - 08/12/17 1330    Subjective  No changes reported by caregiver during session.  Pt denies pain.     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  No/denies                 Note that this is 10th visit progress note to cover dates 6/20-7/19/19.  Pt is maintaining current level of mobility per maintenance goals.  See goal section below.   TE:  While in supine on wedge, had pt perform BLE bridging x 10 reps with 5 sec holds, SLR x 10 reps on each side with tactile and verbal cues for slower/controlled  motion, lower pelvic rotation x 10 reps with PT assisting to slow movement and increase ROM.    TA:  Performed sitting>R LE>supine with max A during session with pt requiring max time to initiate and assist with task.  Needs facilitation for R lateral weight shift onto elbow and assist for BLEs onto mat.  She is able to roll to supine (elevated) at min A level.  Rolling to the R, pt does well initiating movement with LUE towards PT with cues for turning head towards turn.  She is also able to initiate lower LEs off mat with mod A to complete task.  Cues to maintain forward weight shift throughout transition.    NMR:  Addressed trunk and postural control with sitting balance on EOM.  Pt able to sit at [facility] with RUE support for 5 mins with mostly S intermittent min A and cues to regain balance with increased R lateral weight shift and  "tipping head" to the R.  PT intermittently taking RUE support away from mat and having pt attempt to initiate more forward trunk lean to carryover to improved functional transfers and also for B hip stretch.  Pt only able to reach to about knee level without assist from PT during session with constant cues to decrease "pulling" with UEs and more trunk/core activation.  Also worked in standing x 2 reps with heavy facilitation for more upright posture (PT working to increase R lateral trunk flexion and L lateral weight shifts through hips).  PT able to correct posture and pt able to initiate this correction as well however unable to maintain more than a few seconds.  Ended session with short bout of gait x 25' with +2A (max A each) with heavy facilitation needed for upright posture, forward and lateral weight shift and assist to finish forward advancement of LE.  PT providing cues and facilitation to allow pt increased time to complete weight shift prior to stepping.  Note good weight shift to the R and ability to increase R hip extension, however has marked difficulty with L lateral weight shift.                PT Education - 08/12/17 1330    Education Details  checking goals this session to support maintence program    Person(s) Educated  Patient;Caregiver(s)    Methods  Explanation    Comprehension  Verbalized understanding       PT Short Term Goals - 06/10/17 1305      PT SHORT TERM GOAL #1   Title  Perform sit to/from stand from wheelchair or mat with +1 mod assist.    Baseline  06/09/16: met today    Status  Achieved      PT SHORT TERM GOAL #2   Title  Pt will perform sit to stand from wheelchair to RW with max assist for pre-gait skill.    Baseline  06/09/17: met today    Status  Achieved      PT SHORT TERM GOAL #3   Title  Pt will transfer supine to sit with max assist.    Baseline  06/09/17: met today, needs increased time to assist    Status  Achieved      PT SHORT TERM  GOAL #4   Title  Pt will sit with RUE support with min to mod assist unsupported on side of mat for at least 3" to demo improved trunk strength and core stabilization.    Baseline  06/09/17:  met today     Status  Achieved      PT SHORT TERM GOAL #5   Title  Pt will amb. 15' with appropriate assistive device with max assist +1    Baseline  06/09/17: pt now needs mod/max assist of 2 people for gait using modified 3 muskateer assist and blue rocker AFO to right LE    Status  Partially Met        PT Long Term Goals - 08/12/17 1332      PT LONG TERM GOAL #6   Title  Pt will be able to return to participation in therapeutic horseback riding at [place] for ongoing exercise program. (NEW MAINTAINANCE GOALS:  Reassess 8/13 and 9/10)      Baseline  Not returned as of 07-12-17 - pt has had bil. hip surgeries    Time  4    Period  Weeks    Status  On-going      PT LONG TERM GOAL #7   Title  Pt will maintain ability to transfer (stand pivot) from varying seated surfaces at max A level to decrease burden of care to single caregiver.     Baseline  mod to max A (variable) on 08/12/17    Time  4    Period  Weeks    Status  On-going      PT LONG TERM GOAL #8   Title  Pt will maintain sitting balance with RUE support at min A level x 5 minutes in order to maintain postural and trunk control without external support of w/c in order to indicate improved postural alignment.      Baseline  Pt able to sit at S to min A level with RUE support x 5 mins, requires increased support if RUE not able to support on mat    Time  4    Period  Weeks    Status  On-going      PT LONG TERM GOAL  #9   TITLE  Pt will maintain ability to ambulate with +2 max A for 20' in order to faciliate weight bearing to maintain bone density, decrease spasticity, and decrease the progression of contractures.     Baseline  Pt able to ambulate x 25' with +2 max A 08/12/17    Time  4    Period  Weeks    Status  On-going      PT  LONG TERM GOAL  #10   TITLE  Pt will maintain ability to stand with RUE support x 5 mins at min A level for LE stretching, for improved bowel, bladder and respiratory function.      Baseline  would like to assess this goal in // bars as she has solid UE support in this location and can simulate at home with stedy     Time  4    Period  Weeks    Status  On-going            Plan - 08/12/17 1331    Clinical Impression Statement  Skilled session focused on assessment of maintanence goals to ensure she is maintaining current mobility status.      Rehab Potential  Fair    Clinical Impairments Affecting Rehab Potential  severity of deficits with previous extensive therapies    PT Frequency  1x / week    PT Duration  12 weeks    PT Treatment/Interventions  ADLs/Self Care Home Management;Therapeutic exercise;Therapeutic activities;Functional mobility training;Gait training;Balance training;Neuromuscular  re-education;Patient/family education;Orthotic Fit/Training;Passive range of motion    PT Next Visit Plan  make sure she can meet that last LTG for 5 mins of single UE standing in // bars.  Cont LE stretching, standing/gait as assistance is available       Patient will benefit from skilled therapeutic intervention in order to improve the following deficits and impairments:  Abnormal gait, Decreased activity tolerance, Decreased balance, Decreased cognition, Decreased coordination, Decreased mobility, Decreased range of motion, Decreased endurance, Hypomobility, Decreased strength, Impaired flexibility, Increased muscle spasms, Impaired UE functional use, Postural dysfunction, Pain  Visit Diagnosis: Abnormal posture  Muscle weakness (generalized)  Unsteadiness on feet  Other abnormalities of gait and mobility  Stiffness of left hip, not elsewhere classified  Stiffness of right hip, not elsewhere classified  Other symptoms and signs involving the nervous system     Problem  List Patient Active Problem List   Diagnosis Date Noted  . Coagulase negative Staphylococcus bacteremia 02/09/2017  . Abdominal distension   . Chronic mucus hypersecretion, respiratory 12/31/2016  . Severe protein-calorie malnutrition [name]: less than 60% of standard weight) ([place]) 12/31/2016  . Acute urinary retention 12/31/2016  . Constipation 12/31/2016  . Shortness of breath   . Respiratory distress   . Pneumonia 12/05/2016  . Acute respiratory failure ([place]) 12/05/2016  . Pseudomonas urinary tract infection 10/27/2016  . Severe muscle deconditioning 09/30/2016  . Pressure injury of skin 09/28/2016  . Respiratory failure ([place])   . Tracheobronchitis   . Urinary retention   . PEG (percutaneous endoscopic gastrostomy) status ([place])   . Benign essential HTN   . Hyponatremia   . Hypokalemia   . Reactive hypertension   . Respiratory insufficiency 09/24/2016  . Malignant neoplasm of upper-inner quadrant of left breast in female, estrogen receptor positive ([place]) 07/14/2016  . Heterotopic ossification of bone 03/10/2016  . Acute on chronic respiratory failure with hypoxia ([place])   . Acute hypoxemic respiratory failure ([place])   . Acute blood loss anemia   . Atelectasis 02/04/2016  . Debility 02/03/2016  . Pneumonia of both lower lobes due to Pseudomonas species ([place])   . Pneumonia of both lower lobes due to methicillin susceptible Staphylococcus aureus (MSSA) ([place])   . Acute respiratory failure with hypoxia ([place])   . Acute encephalopathy   . Seizures ([place])   . Sepsis ([place])   . Hypoxia   . Pain   . Chronic respiratory failure ([place]) 12/28/2015  . Acute tracheobronchitis 12/24/2015  . Tracheostomy tube present ([place])   . Tracheal stenosis   . Chronic respiratory failure with hypoxia ([place])   . Spastic tetraplegia ([place]) 10/20/2015  . Tracheostomy status ([place]) 09/26/2015  . Allergic rhinitis 09/26/2015  . 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 Movement disorder 09/22/2015   . Encephalitis 09/22/2015  . Chest pain 02/07/2014  . Premature atrial contractions 01/13/2014  . Abnormality of gait 12/04/2013  . Hyperlipidemia 12/21/2011  . Cardiovascular risk factor 12/21/2011  . Bunion 01/31/2008  . Metatarsalgia of both feet 09/20/2007  . FLAT FOOT 09/20/2007  . HALLUX RIGIDUS, ACQUIRED 09/20/2007    Cameron Sprang, PT, MPT [facility] [address] [place], [place], [address] Phone: [phone]   Fax:  [phone] 08/12/17, 1:48 PM  Name: Nyia Tsao MRN: [id number] Date of Birth: 05-06-1950

## 2017-08-15 ENCOUNTER — Ambulatory Visit: Payer: Medicare Other | Admitting: Physical Therapy

## 2017-08-15 DIAGNOSIS — R29818 Other symptoms and signs involving the nervous system: Secondary | ICD-10-CM

## 2017-08-15 DIAGNOSIS — R293 Abnormal posture: Secondary | ICD-10-CM | POA: Diagnosis not present

## 2017-08-15 DIAGNOSIS — R2689 Other abnormalities of gait and mobility: Secondary | ICD-10-CM

## 2017-08-15 DIAGNOSIS — M6281 Muscle weakness (generalized): Secondary | ICD-10-CM

## 2017-08-16 ENCOUNTER — Encounter: Payer: Self-pay | Admitting: Physical Therapy

## 2017-08-16 NOTE — Therapy (Signed)
[facility] [address] [place], [place], [address] Phone: [phone]   Fax:  [phone]  Physical Therapy Treatment  Patient Details  Name: Judy Spears MRN: [id number] Date of Birth: 07/31/1950 Referring Provider: Dr. Levada Schilling   Encounter Date: 08/15/2017  PT End of Session - 08/16/17 1850    Visit Number  21    Number of Visits  28    Date for PT Re-Evaluation  10/04/17    Authorization Type  [employer] Medicare    Authorization Time Period  07/12/17-10/04/17    PT Start Time  1029 pt arrived 50" late    PT Stop Time  1135 extended treatment time due to no 11:00 patient    PT Time Calculation (min)  66 min       Past Medical History:  Diagnosis Date  . Arthritis    lt great toe  . Complication of anesthesia    pt has had headaches post op-did advise to hydra well preop  . Hair loss    right-sided  . History of exercise stress test    a. ETT (12/15) with 12:00 exercise, no ST changes, occasional PACs.   . Hyperlipidemia   . Insomnia   . Migraine headache   . Movement disorder    resltess in left legs  . Postmenopausal   . 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 Premature atrial contractions    a. Holter (12/15) with 8% PACs, no atrial runs or atrial fibrillation.     Past Surgical History:  Procedure Laterality Date  . BREAST BIOPSY  01/01/2011   Procedure: BREAST BIOPSY WITH NEEDLE LOCALIZATION;  Surgeon: Rolm Bookbinder, MD;  Location: [facility];  Service: General;  Laterality: Left;  left breast wire localization biopsy  . BREAST BIOPSY Right 07/27/2012  . BREAST BIOPSY Right 07/07/2016  . BREAST BIOPSY Left 06/30/2016  . cataracts right eye    . CESAREAN SECTION  1986  . HERNIA REPAIR  2000   RIH  . INTRAMEDULLARY (IM) NAIL INTERTROCHANTERIC Left 01/30/2017   Procedure: ORIF AFFIXUS NAIL;  Surgeon: Paralee Cancel, MD;  Location: [facility];  Service: Orthopedics;  Laterality: Left;  .  IR REPLC GASTRO/COLONIC TUBE PERCUT W/FLUORO  09/29/2016  . opirectinal membrane peel    . PEG PLACEMENT    . RHINOPLASTY  1983  . TRACHEOSTOMY    . TRACHEOSTOMY TUBE PLACEMENT N/A 12/15/2016   Procedure: TRACHEOSTOMY CHANGE;  Surgeon: [name] Montane, MD;  Location: [facility];  Service: ENT;  Laterality: N/A;    There were no vitals filed for this visit.  Subjective Assessment - 08/16/17 1838    Subjective  Pt arrived 15" late - [name], caregiver, stated pt had to go to bathroom as they were leaving home    Patient is accompained by:  Family member husband and caregiver    Pertinent History  Powassen viral encephalitis; anoxic BI:  ORIF Lt hip 02-09-17;  Rt THR  04-12-17    Patient Stated Goals  improve mobility; walk    Currently in Pain?  No/denies                       [facility] Adult PT Treatment/Exercise - 08/16/17 0001      Bed Mobility   Bed Mobility  Sit to Supine    Supine to Sit  Maximal Assistance - Patient - Patient 25-49%    Sit to Supine  Maximal Assistance - Patient 25-49%  Sit to Supine - Details (indicate cue type and reason)  cues for pt to bring RUE across body with supine to rolling onto left side; pt requires total assist to transfer LE's off mat in Lt sidelying to sitting      Transfers   Transfers  Sit to Stand;Stand to Sit    Sit to Stand  3: Mod assist    Sit to Stand Details  Tactile cues for sequencing;Tactile cues for posture;Tactile cues for weight shifting;Verbal cues for sequencing;Verbal cues for technique;Manual facilitation for weight shifting;Manual facilitation for placement;Manual facilitation for weight bearing    Stand to Sit  3: Mod assist    Stand to Sit Details (indicate cue type and reason)  Tactile cues for sequencing;Tactile cues for weight shifting;Tactile cues for posture;Tactile cues for placement;Verbal cues for sequencing;Verbal cues for technique;Manual facilitation for weight shifting;Manual facilitation for placement;Manual  facilitation for weight bearing    Stand Pivot Transfers  3: Mod assist    Stand Pivot Transfer Details (indicate cue type and reason)  wheelchair to mat      Ambulation/Gait   Ambulation/Gait  Yes    Ambulation/Gait Assistance  1: +2 Total assist    Ambulation/Gait Assistance Details  initial gait training rep - +2 max to total assist given with pt not wearing AFO's & no blue shoe covers used; pt required max assist to advance each leg iwth foot drop noted     Ambulation Distance (Feet)  18 Feet 25' 2nd rep with bil. AFO's and use of blue shoe covers    Assistive device  2 person hand held assist    Gait Pattern  Step-to pattern;Step-through pattern;Decreased stride length;Decreased dorsiflexion - right;Decreased dorsiflexion - left;Decreased weight shift to left;Ataxic;Trunk flexed;Narrow base of support;Poor foot clearance - left Caregiver, [name], assisted pt in advancing LLE    Ambulation Surface  Level;Indoor      Posture/Postural Control   Posture/Postural Control  Postural limitations    Posture Comments  leans toward left side with Rt hip abducted (posture in standing)      Knee/Hip Exercises: Stretches   Other Knee/Hip Stretches  lower trunk rotation stretch to Rt and Lt sides - 30 sec hold      Knee/Hip Exercises: Supine   Heel Slides  AAROM;Both;1 set;10 reps    Bridges  AROM;Both;1 set;10 reps    Other Supine Knee/Hip Exercises  hip abdct./adduction in hooklying       Pt stood in // bars with RUE support x 5" with min assist with signficant postural deviations; deviations were reduced with  Mod assist to support left upper trunk near axilla region and with tactile cue for pt to adduct Rt hip; verbal cues also were provided For pt to stand erect, keep shoulders lifted as pt leans anteriorly and to left side         PT Short Term Goals - 06/10/17 1305      PT SHORT TERM GOAL #1   Title  Perform sit to/from stand from wheelchair or mat with +1 mod assist.    Baseline   06/09/16: met today    Status  Achieved      PT SHORT TERM GOAL #2   Title  Pt will perform sit to stand from wheelchair to RW with max assist for pre-gait skill.    Baseline  06/09/17: met today    Status  Achieved      PT SHORT TERM GOAL #3   Title  Pt will transfer  supine to sit with max assist.    Baseline  06/09/17: met today, needs increased time to assist    Status  Achieved      PT SHORT TERM GOAL #4   Title  Pt will sit with RUE support with min to mod assist unsupported on side of mat for at least 3" to demo improved trunk strength and core stabilization.    Baseline  06/09/17: met today     Status  Achieved      PT SHORT TERM GOAL #5   Title  Pt will amb. 15' with appropriate assistive device with max assist +1    Baseline  06/09/17: pt now needs mod/max assist of 2 people for gait using modified 3 muskateer assist and blue rocker AFO to right LE    Status  Partially Met        PT Long Term Goals - 08/12/17 1332      PT LONG TERM GOAL #6   Title  Pt will be able to return to participation in therapeutic horseback riding at [place] for ongoing exercise program. (NEW MAINTAINANCE GOALS:  Reassess 8/13 and 9/10)      Baseline  Not returned as of 07-12-17 - pt has had bil. hip surgeries    Time  4    Period  Weeks    Status  On-going      PT LONG TERM GOAL #7   Title  Pt will maintain ability to transfer (stand pivot) from varying seated surfaces at max A level to decrease burden of care to single caregiver.     Baseline  mod to max A (variable) on 08/12/17    Time  4    Period  Weeks    Status  On-going      PT LONG TERM GOAL #8   Title  Pt will maintain sitting balance with RUE support at min A level x 5 minutes in order to maintain postural and trunk control without external support of w/c in order to indicate improved postural alignment.      Baseline  Pt able to sit at S to min A level with RUE support x 5 mins, requires increased support if RUE not able to support  on mat    Time  4    Period  Weeks    Status  On-going      PT LONG TERM GOAL  #9   TITLE  Pt will maintain ability to ambulate with +2 max A for 20' in order to faciliate weight bearing to maintain bone density, decrease spasticity, and decrease the progression of contractures.     Baseline  Pt able to ambulate x 25' with +2 max A 08/12/17    Time  4    Period  Weeks    Status  On-going      PT LONG TERM GOAL  #10   TITLE  Pt will maintain ability to stand with RUE support x 5 mins at min A level for LE stretching, for improved bowel, bladder and respiratory function.      Baseline  would like to assess this goal in // bars as she has solid UE support in this location and can simulate at home with stedy     Time  4    Period  Weeks    Status  On-going            Plan - 08/16/17 1852    Clinical Impression Statement  Pt  did very well with ambulation with use of Blue Rocker AFO's on bil. LE's and use of blue shoe covers to assist with decreasing friction to assist LE's with swing through in gait.  Pt able to advance each leg with use of AFO's and shoe covers majority of 25' distance - and unable to do so on 1st rep of gait training when no AFO's or shoe covers were used.      Rehab Potential  Fair    Clinical Impairments Affecting Rehab Potential  severity of deficits with previous extensive therapies    PT Frequency  1x / week    PT Duration  12 weeks    PT Treatment/Interventions  ADLs/Self Care Home Management;Therapeutic exercise;Therapeutic activities;Functional mobility training;Gait training;Balance training;Neuromuscular re-education;Patient/family education;Orthotic Fit/Training;Passive range of motion    PT Next Visit Plan  cont LE stretching, gait training , static standing; use blue rocker AFO's     PT Home Exercise Plan  caregiver and husband have previously been instructed in ROM, sitting balance, and transfer training    Consulted and Agree with Plan of Care  Patient        Patient will benefit from skilled therapeutic intervention in order to improve the following deficits and impairments:  Abnormal gait, Decreased activity tolerance, Decreased balance, Decreased cognition, Decreased coordination, Decreased mobility, Decreased range of motion, Decreased endurance, Hypomobility, Decreased strength, Impaired flexibility, Increased muscle spasms, Impaired UE functional use, Postural dysfunction, Pain  Visit Diagnosis: Muscle weakness (generalized)  Other abnormalities of gait and mobility  Other symptoms and signs involving the nervous system     Problem List Patient Active Problem List   Diagnosis Date Noted  . Coagulase negative Staphylococcus bacteremia 02/09/2017  . Abdominal distension   . Chronic mucus hypersecretion, respiratory 12/31/2016  . Severe protein-calorie malnutrition [name]: less than 60% of standard weight) ([place]) 12/31/2016  . Acute urinary retention 12/31/2016  . Constipation 12/31/2016  . Shortness of breath   . Respiratory distress   . Pneumonia 12/05/2016  . Acute respiratory failure ([place]) 12/05/2016  . Pseudomonas urinary tract infection 10/27/2016  . Severe muscle deconditioning 09/30/2016  . Pressure injury of skin 09/28/2016  . Respiratory failure ([place])   . Tracheobronchitis   . Urinary retention   . PEG (percutaneous endoscopic gastrostomy) status ([place])   . Benign essential HTN   . Hyponatremia   . Hypokalemia   . Reactive hypertension   . Respiratory insufficiency 09/24/2016  . Malignant neoplasm of upper-inner quadrant of left breast in female, estrogen receptor positive ([place]) 07/14/2016  . Heterotopic ossification of bone 03/10/2016  . Acute on chronic respiratory failure with hypoxia ([place])   . Acute hypoxemic respiratory failure ([place])   . Acute blood loss anemia   . Atelectasis 02/04/2016  . Debility 02/03/2016  . Pneumonia of both lower lobes due to Pseudomonas species ([place])   . Pneumonia of both lower  lobes due to methicillin susceptible Staphylococcus aureus (MSSA) ([place])   . Acute respiratory failure with hypoxia ([place])   . Acute encephalopathy   . Seizures ([place])   . Sepsis ([place])   . Hypoxia   . Pain   . Chronic respiratory failure ([name]) 12/28/2015  . Acute tracheobronchitis 12/24/2015  . Tracheostomy tube present ([place])   . Tracheal stenosis   . Chronic respiratory failure with hypoxia ([place])   . Spastic tetraplegia ([place]) 10/20/2015  . Tracheostomy status ([place]) 09/26/2015  . Allergic rhinitis 09/26/2015  . 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 Movement disorder 09/22/2015  .  Encephalitis 09/22/2015  . Chest pain 02/07/2014  . Premature atrial contractions 01/13/2014  . Abnormality of gait 12/04/2013  . Hyperlipidemia 12/21/2011  . Cardiovascular risk factor 12/21/2011  . Bunion 01/31/2008  . Metatarsalgia of both feet 09/20/2007  . FLAT FOOT 09/20/2007  . HALLUX RIGIDUS, ACQUIRED 09/20/2007    Alda Lea, PT 08/16/2017, 6:59 PM  [facility] [address] [place], [place], [address] Phone: [phone]   Fax:  [phone]  Name: Denese Mentink MRN: [id number] Date of Birth: 1950-12-26

## 2017-08-17 ENCOUNTER — Ambulatory Visit: Payer: Medicare Other

## 2017-08-17 ENCOUNTER — Ambulatory Visit: Payer: Medicare Other | Admitting: Occupational Therapy

## 2017-08-17 ENCOUNTER — Encounter: Payer: Self-pay | Admitting: Occupational Therapy

## 2017-08-17 DIAGNOSIS — R1312 Dysphagia, oropharyngeal phase: Secondary | ICD-10-CM

## 2017-08-17 DIAGNOSIS — M6281 Muscle weakness (generalized): Secondary | ICD-10-CM

## 2017-08-17 DIAGNOSIS — R293 Abnormal posture: Secondary | ICD-10-CM

## 2017-08-17 DIAGNOSIS — R41844 Frontal lobe and executive function deficit: Secondary | ICD-10-CM

## 2017-08-17 DIAGNOSIS — R29818 Other symptoms and signs involving the nervous system: Secondary | ICD-10-CM

## 2017-08-17 DIAGNOSIS — R41841 Cognitive communication deficit: Secondary | ICD-10-CM

## 2017-08-17 DIAGNOSIS — R4701 Aphasia: Secondary | ICD-10-CM

## 2017-08-17 DIAGNOSIS — R2681 Unsteadiness on feet: Secondary | ICD-10-CM

## 2017-08-17 DIAGNOSIS — R278 Other lack of coordination: Secondary | ICD-10-CM

## 2017-08-17 DIAGNOSIS — R471 Dysarthria and anarthria: Secondary | ICD-10-CM

## 2017-08-17 NOTE — Therapy (Signed)
Judy Spears [address] [place], [place], [address] Phone: [phone]   Fax:  [phone]  Occupational Therapy Treatment  Patient Details  Name: Judy Spears MRN: [id number] Date of Birth: 1950-09-24 Referring Provider: Dr. Levada Schilling   Encounter Date: 08/17/2017  OT End of Session - 08/17/17 1718    Visit Number  11    Number of Visits  24    Date for OT Re-Evaluation  08/03/17    Authorization Type  medicare maintenance program - pt to be reassessed every 30 days per Medicare guidelines  Will need PN every 10th visit    Authorization Time Period  90 days - 09/28/2017    OT Start Time  1533    OT Stop Time  1616    OT Time Calculation (min)  43 min    Activity Tolerance  Patient tolerated treatment well    Behavior During Therapy  [facility] for tasks assessed/performed       Past Medical History:  Diagnosis Date  . Arthritis    lt great toe  . Complication of anesthesia    pt has had headaches post op-did advise to hydra well preop  . Hair loss    right-sided  . History of exercise stress test    a. ETT (12/15) with 12:00 exercise, no ST changes, occasional PACs.   . Hyperlipidemia   . Insomnia   . Migraine headache   . Movement disorder    resltess in left legs  . Postmenopausal   . 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 Premature atrial contractions    a. Holter (12/15) with 8% PACs, no atrial runs or atrial fibrillation.     Past Surgical History:  Procedure Laterality Date  . BREAST BIOPSY  01/01/2011   Procedure: BREAST BIOPSY WITH NEEDLE LOCALIZATION;  Surgeon: Rolm Bookbinder, MD;  Location: [facility];  Service: General;  Laterality: Left;  left breast wire localization biopsy  . BREAST BIOPSY Right 07/27/2012  . BREAST BIOPSY Right 07/07/2016  . BREAST BIOPSY Left 06/30/2016  . cataracts right eye    . CESAREAN SECTION  1986  . HERNIA REPAIR  2000   RIH  . INTRAMEDULLARY  (IM) NAIL INTERTROCHANTERIC Left 01/30/2017   Procedure: ORIF AFFIXUS NAIL;  Surgeon: Paralee Cancel, MD;  Location: [facility];  Service: Orthopedics;  Laterality: Left;  . IR REPLC GASTRO/COLONIC TUBE PERCUT W/FLUORO  09/29/2016  . opirectinal membrane peel    . PEG PLACEMENT    . RHINOPLASTY  1983  . TRACHEOSTOMY    . TRACHEOSTOMY TUBE PLACEMENT N/A 12/15/2016   Procedure: TRACHEOSTOMY CHANGE;  Surgeon: Judy Spears Montane, MD;  Location: [facility];  Service: ENT;  Laterality: N/A;    There were no vitals filed for this visit.  Subjective Assessment - 08/17/17 1712    Subjective   Hi [name]!    Pertinent History  see epic snapshot - ABI/encephalitis/hypoxia from virus; recent hospitalization due to MRSA PNA     Patient Stated Goals  Pt unable to state.     Currently in Pain?  No/denies    Pain Score  0-No pain                   OT Treatments/Exercises (OP) - 08/17/17 0001      Neurological Re-education Exercises   Other Exercises 1  Neuromuscular reeducation to reinforce concepts of weight shift forward and trunk alignment over base of support to aide with sit to/from  stand.  Working to align body over feet in an upright posture.  Patient with tendency to flex upper trunk too far forward, and at times even pull down into further flexion.  Working to reinforce right lateral trunk shortening- tipping head toward right shoulder, tucking right hip toward center.  to align patient closer to midline to decrease reliance on others or UE's for standing activities.  Seated on physioball to continue to reinforce lower trunk initiated movement, and to encourage lateral trunk shortening on right.               OT Education - 08/17/17 1718    Education provided  Yes    Education Details  cueing to align body over feet in standing or over hips in sitting    Person(s) Educated  Patient;Caregiver(s)    Methods  Explanation;Demonstration;Other (comment)    Comprehension  Need further instruction        OT Short Term Goals - 07/14/17 1433      OT SHORT TERM GOAL #1   Title  --    Status  --      OT SHORT TERM GOAL #2   Title  --    Status  --      OT SHORT TERM GOAL #6   Title  --      OT SHORT TERM GOAL #7   Title  --        OT Long Term Goals - 08/08/17 1252      OT LONG TERM GOAL #1   Title  Pt will maintain ability to roll in bed during self care tasks with no more than max a x1 - reasssess 08/31/2017    Status  On-going      OT LONG TERM GOAL #2   Title  Pt will maintain the ability to use roll in shower chair with care giver assistance     Status  On-going      OT LONG TERM GOAL #3   Title  Pt will maintain ability to transfer to toilet with no more than max a x1 using Steady Assist - 10/06/2017      OT LONG TERM GOAL #4   Title  Pt wil maintain ability for dressing at max a x1.     Status  On-going      OT LONG TERM GOAL #5   Title  Pt will maintain ability to stand with max a x1 to for clothing mgmt with toileting.     Status  On-going      OT LONG TERM GOAL #6   Title  Pt will maintain ability to participate in functional transfers, as well as stand to sit and sit to stand with no more than max a x1    Status  On-going      OT LONG TERM GOAL #7   Title  Pt will maintain functional mobility and trunk control to aid in her ability to clear sucretions and assist with phonation to express basic needs/wants    Status  On-going            Plan - 08/17/17 1719    Clinical Impression Statement  Patient alert today, although having difficulty keeping left eye (and occasionally right open) due to eye irrititation.      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    Occupational performance deficits (Please refer to evaluation for details):   ADL's;IADL's;Rest and Sleep;Leisure;Social Participation;Work    Neurosurgeon    Current Impairments/barriers affecting progress:  severity of multi system deficits, intermittent medical issues    OT Frequency  2x / week    OT Duration  12 weeks    OT Treatment/Interventions  Self-care/ADL training;Ultrasound;Moist Heat;Electrical Stimulation;DME and/or AE instruction;Neuromuscular education;Therapeutic exercise;Functional Mobility Training;Manual Therapy;Passive range of motion;Splinting;Therapeutic activities;Balance training;Patient/family education;Visual/perceptual remediation/compensation;Cognitive remediation/compensation    Plan  address rigidity and tone in trunk and extremeties, NMR trunk and LE's for transitional movements, static standing, sitting balance, sit to stand, stand to sit, stepping;     Consulted and Agree with Plan of Care  Patient;Family member/caregiver    Family Member Consulted  caregiver Leesa       Patient will benefit from skilled therapeutic intervention in order to improve the following deficits and impairments:  Decreased endurance, Decreased skin integrity, Impaired vision/preception, Improper body mechanics, Impaired perceived functional ability, Decreased knowledge of precautions, Cardiopulmonary status limiting activity, Decreased activity tolerance, Decreased knowledge of use of DME, Decreased strength, Impaired flexibility, Improper spinal/pelvic alignment, Impaired sensation, Difficulty walking, Decreased mobility, Decreased balance, Decreased cognition, Decreased range of motion, Impaired tone, Impaired UE functional use, Decreased safety awareness, Decreased coordination, Pain, Abnormal gait  Visit Diagnosis: Abnormal posture  Other symptoms and signs involving the nervous system  Muscle weakness (generalized)  Unsteadiness on feet  Other lack of coordination  Frontal lobe and executive function deficit    Problem List Patient Active  Problem List   Diagnosis Date Noted  . Coagulase negative Staphylococcus bacteremia 02/09/2017  . Abdominal distension   . Chronic mucus hypersecretion, respiratory 12/31/2016  . Severe protein-calorie malnutrition [name]: less than 60% of standard weight) ([place]) 12/31/2016  . Acute urinary retention 12/31/2016  . Constipation 12/31/2016  . Shortness of breath   . Respiratory distress   . Pneumonia 12/05/2016  . Acute respiratory failure ([place]) 12/05/2016  . Pseudomonas urinary tract infection 10/27/2016  . Severe muscle deconditioning 09/30/2016  . Pressure injury of skin 09/28/2016  . Respiratory failure ([place])   . Tracheobronchitis   . Urinary retention   . PEG (percutaneous endoscopic gastrostomy) status ([place])   . Benign essential HTN   . Hyponatremia   . Hypokalemia   . Reactive hypertension   . Respiratory insufficiency 09/24/2016  . Malignant neoplasm of upper-inner quadrant of left breast in female, estrogen receptor positive ([place]) 07/14/2016  . Heterotopic ossification of bone 03/10/2016  . Acute on chronic respiratory failure with hypoxia ([place])   . Acute hypoxemic respiratory failure ([place])   . Acute blood loss anemia   . Atelectasis 02/04/2016  . Debility 02/03/2016  . Pneumonia of both lower lobes due to Pseudomonas species ([place])   . Pneumonia of both lower lobes due to methicillin susceptible Staphylococcus aureus (MSSA) ([place])   . Acute respiratory failure with hypoxia ([place])   . Acute encephalopathy   . Seizures ([place])   . Sepsis ([place])   . Hypoxia   . Pain   . Chronic respiratory failure ([place]) 12/28/2015  . Acute tracheobronchitis 12/24/2015  . Tracheostomy tube present ([place])   . Tracheal stenosis   . Chronic respiratory failure with hypoxia ([place])   . Spastic tetraplegia ([place]) 10/20/2015  . Tracheostomy status ([place]) 09/26/2015  . Allergic rhinitis 09/26/2015  . 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 Movement disorder 09/22/2015  . Encephalitis  09/22/2015  .  Chest pain 02/07/2014  . Premature atrial contractions 01/13/2014  . Abnormality of gait 12/04/2013  . Hyperlipidemia 12/21/2011  . Cardiovascular risk factor 12/21/2011  . Bunion 01/31/2008  . Metatarsalgia of both feet 09/20/2007  . FLAT FOOT 09/20/2007  . HALLUX RIGIDUS, ACQUIRED 09/20/2007    Mariah Milling, OTR/L 08/17/2017, 5:20 PM  [facility] [address] [place], [place], [address] Phone: [phone]   Fax:  [phone]  Name: Judy Spears MRN: [id number] Date of Birth: 1950/08/13

## 2017-08-17 NOTE — Therapy (Signed)
[facility] [address] [place], [place], [address] Phone: [phone]   Fax:  [phone]  Speech Language Pathology Treatment  Patient Details  Name: Judy Spears MRN: [id number] Date of Birth: 1950-05-28 Referring Provider: Levada Schilling, MD   Encounter Date: 08/17/2017  End of Session - 08/17/17 1654    Visit Number  19    Number of Visits  21    Date for SLP Re-Evaluation  09/09/17    SLP Start Time  1450    SLP Stop Time   8185    SLP Time Calculation (min)  41 min       Past Medical History:  Diagnosis Date  . Arthritis    lt great toe  . Complication of anesthesia    pt has had headaches post op-did advise to hydra well preop  . Hair loss    right-sided  . History of exercise stress test    a. ETT (12/15) with 12:00 exercise, no ST changes, occasional PACs.   . Hyperlipidemia   . Insomnia   . Migraine headache   . Movement disorder    resltess in left legs  . Postmenopausal   . 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 Premature atrial contractions    a. Holter (12/15) with 8% PACs, no atrial runs or atrial fibrillation.     Past Surgical History:  Procedure Laterality Date  . BREAST BIOPSY  01/01/2011   Procedure: BREAST BIOPSY WITH NEEDLE LOCALIZATION;  Surgeon: Rolm Bookbinder, MD;  Location: [facility];  Service: General;  Laterality: Left;  left breast wire localization biopsy  . BREAST BIOPSY Right 07/27/2012  . BREAST BIOPSY Right 07/07/2016  . BREAST BIOPSY Left 06/30/2016  . cataracts right eye    . CESAREAN SECTION  1986  . HERNIA REPAIR  2000   RIH  . INTRAMEDULLARY (IM) NAIL INTERTROCHANTERIC Left 01/30/2017   Procedure: ORIF AFFIXUS NAIL;  Surgeon: Paralee Cancel, MD;  Location: [facility];  Service: Orthopedics;  Laterality: Left;  . IR REPLC GASTRO/COLONIC TUBE PERCUT W/FLUORO  09/29/2016  . opirectinal membrane peel    . PEG PLACEMENT    . RHINOPLASTY  1983  .  TRACHEOSTOMY    . TRACHEOSTOMY TUBE PLACEMENT N/A 12/15/2016   Procedure: TRACHEOSTOMY CHANGE;  Surgeon: [name] Montane, MD;  Location: [facility];  Service: ENT;  Laterality: N/A;    There were no vitals filed for this visit.  Subjective Assessment - 08/17/17 1639    Subjective  Pt enters with pharyngeal/laryngeal congestion.    Patient is accompained by:  Family member [name], and [name] (aide)    Currently in Pain?  No/denies            ADULT SLP TREATMENT - 08/17/17 1640      General Information   Behavior/Cognition  Alert;Cooperative;Distractible intermittent somnolence      Treatment Provided   Treatment provided  Cognitive-Linquistic      Cognitive-Linquistic Treatment   Treatment focused on  Dysarthria;Patient/family/caregiver education    Skilled Treatment  Pt arrived with pharyngeal/laryngeal congestion which persisted throughout therapy session. Pt was provided water by husband - without oral care prior, however this did clear pt's congestion and pt voice became clear. Spontaneous one word at a time production by pt approx 15% of the time, and this was initiated approx 30% of the time if pt was asked to repeat. Pt was somnolent requiring tactile cues to wake x6 and verbal cues to wake x3  during the session today. In today's session Judy Spears (aide) demonstrated good awareness of how to work with pt to exaggerate articulation by providing appropriate verbal and visual cueing, and provided these cues during appropriate times. Additionally, when pt attempted to swallow to clear congestion/secretions she provided good tactile cues from which pt could benefit. Frequency of functional voice level and of functional intelligibility were as follows today: IMITATION: Voice at functional level; 1-syllable 40% (2/5), 2-syllable- 100% (2/2), 3 syllable 100% (1/1). Intelligibility; 1-syllable 20% (1/5), 2-syllable 100% (2/2), 3-syllable 100% (1/1). SPONTANEOUS: Voice at functional level; 1-syllable 83%  (5/6), 2-syllable 70% (7/10), 3 syllable 0% (0/4). Functionally Intelligible: 1-syllable 83% (5/6), 2-syllable 50% (5/10), 3 syllable 0% (0/4), 4 syllable 100% (2/2). SLP did not understand 7 utterances that aide/family did.      Assessment / Recommendations / Plan   Plan  Continue with current plan of care      Dysphagia Recommendations   Diet recommendations  NPO      Progression Toward Goals   Progression toward goals  -- pt/family desire progress towards goals       SLP Education - 08/17/17 1654    Education provided  Yes    Education Details  stressed the compensations for dysarthria ("Breathe, shout, move your mouth")    Person(s) Educated  Patient;Caregiver(s);Spouse    Methods  Explanation;Demonstration;Verbal cues    Comprehension  Returned demonstration;Verbal cues required;Need further instruction       SLP Short Term Goals - 06/27/17 1750      SLP SHORT TERM GOAL #1   Title  Pt will demonstrate dysarthria HEP to increase articulatory coordination for speech with usual mod A over 3 sessions    Status  Deferred      SLP SHORT TERM GOAL #2   Title  Pt will achieve phonation adequate for SLP intelligibility in imitated two-syllable words using intelligibility compensations 12/20 trials with usual min A in 3 sessions    Status  Not Met      SLP SHORT TERM GOAL #3   Title  Pt will imitate 2-syllable words or phrases with functional intelligibility for SLP in 5/15 trials over 3 sessions     Status  Not Met      SLP SHORT TERM GOAL #4   Title  Pt will sustain attention to therapy tasks for 10 minutes with multimodal cues in 5 sessions    Status  Partially Met      SLP SHORT TERM GOAL #5   Title  pt family will demo understanding of oral care regimen, including safe ingestion of water/ice chips over three sessions    Status  Achieved      SLP SHORT TERM GOAL #6   Title  pt will demo dysphagia HEP to improve management of secretions and safety with any possible POs     Status  Deferred      SLP SHORT TERM GOAL #7   Title  family/caregivers will demo understanding of pt swallowing precautions, for pt safety with POs, by verbal report     Status  Achieved       SLP Long Term Goals - 08/17/17 1656      SLP LONG TERM GOAL #1   Title  Pt will demo dysarthria  HEP to incr articulatory coordination for speech, with occasional mod A over three sessions    Status  Not Met      SLP LONG TERM GOAL #2   Title  pt will achieve  phonation adequate for SLP intelligibility in imitated 3-syllable words/3-word phrases using intelligibility compenastions 15/20 trials    Time  3    Period  Weeks    Status  On-going and ongoing      SLP LONG TERM GOAL #3   Title  Pt will imitate 2-syllable words or phrases with functional intelligibility for SLP in 8/15 trials over three sessions    Status  Partially Met 1/3 session      SLP LONG TERM GOAL #4   Title  pt will sustain attention to therapy tasks for 10 minutes with multimodal cues in 10 sessions    Baseline  06-10-17, 07-04-17, 07-06-17; 07-12-17, 07-14-17, 08-10-17, 08-17-17    Time  3    Period  Weeks    Status  On-going and ongoing      SLP LONG TERM GOAL #5   Title  pt will demo dysphagia HEP to improve management of secretions and safety with possible POs    Status  Deferred      SLP LONG TERM GOAL #6   Title  pt's family will demo knowledge about how best to work with pt speech/language/swallow deficits in each session over the next four weeks    Baseline  08-17-17    Time  3    Period  Weeks    Status  On-going       Plan - 08/17/17 1655    Clinical Impression Statement  Patient presents with ongoing deficits in speech intelligibility (dysarthria, reduced breath support), cognitive communication and swallowing. See skilled treatment for details of voicing, functional speech intelligilibty for imitation and spontaneous production of 1-4 syllable words/phrases today. Pt was more intelligible after a water sip was  presented by husband without oral care beforehand. Pt will cont to be seen for approx 2-3 more sessions stressing family education on how best to work with pt after d/c, and for SLP to model these techniques for aide and family.      Speech Therapy Frequency  1x /week    Duration  4 weeks    Treatment/Interventions  Aspiration precaution training;Trials of upgraded texture/liquids;Compensatory strategies;Functional tasks;Patient/family education;Cueing hierarchy;Diet toleration management by SLP;Cognitive reorganization;Compensatory techniques;SLP instruction and feedback;Internal/external aids;Multimodal communcation approach;Pharyngeal strengthening exercises;Oral motor exercises    Potential to Achieve Goals  Fair    Potential Considerations  Ability to learn/carryover information;Co-morbidities;Severity of impairments;Cooperation/participation level;Previous level of function    Consulted and Agree with Plan of Care  Patient;Family member/caregiver    Family Member Consulted  Husband, [name]       Patient will benefit from skilled therapeutic intervention in order to improve the following deficits and impairments:   Dysarthria and anarthria  Cognitive communication deficit  Dysphagia, oropharyngeal phase  Aphasia    Problem List Patient Active Problem List   Diagnosis Date Noted  . Coagulase negative Staphylococcus bacteremia 02/09/2017  . Abdominal distension   . Chronic mucus hypersecretion, respiratory 12/31/2016  . Severe protein-calorie malnutrition [name]: less than 60% of standard weight) ([facility]) 12/31/2016  . Acute urinary retention 12/31/2016  . Constipation 12/31/2016  . Shortness of breath   . Respiratory distress   . Pneumonia 12/05/2016  . Acute respiratory failure ([facility]) 12/05/2016  . Pseudomonas urinary tract infection 10/27/2016  . Severe muscle deconditioning 09/30/2016  . Pressure injury of skin 09/28/2016  . Respiratory failure ([facility])   . Tracheobronchitis   .  Urinary retention   . PEG (percutaneous endoscopic gastrostomy) status ([place])   . Benign essential HTN   .  Hyponatremia   . Hypokalemia   . Reactive hypertension   . Respiratory insufficiency 09/24/2016  . Malignant neoplasm of upper-inner quadrant of left breast in female, estrogen receptor positive ([facility]) 07/14/2016  . Heterotopic ossification of bone 03/10/2016  . Acute on chronic respiratory failure with hypoxia ([facility])   . Acute hypoxemic respiratory failure ([facility])   . Acute blood loss anemia   . Atelectasis 02/04/2016  . Debility 02/03/2016  . Pneumonia of both lower lobes due to Pseudomonas species ([place])   . Pneumonia of both lower lobes due to methicillin susceptible Staphylococcus aureus (MSSA) ([place])   . Acute respiratory failure with hypoxia ([facility])   . Acute encephalopathy   . Seizures ([place])   . Sepsis ([facility])   . Hypoxia   . Pain   . Chronic respiratory failure ([facility]) 12/28/2015  . Acute tracheobronchitis 12/24/2015  . Tracheostomy tube present ([facility])   . Tracheal stenosis   . Chronic respiratory failure with hypoxia ([facility])   . Spastic tetraplegia ([facility]) 10/20/2015  . Tracheostomy status ([place]) 09/26/2015  . Allergic rhinitis 09/26/2015  . 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 Movement disorder 09/22/2015  . Encephalitis 09/22/2015  . Chest pain 02/07/2014  . Premature atrial contractions 01/13/2014  . Abnormality of gait 12/04/2013  . Hyperlipidemia 12/21/2011  . Cardiovascular risk factor 12/21/2011  . Bunion 01/31/2008  . Metatarsalgia of both feet 09/20/2007  . FLAT FOOT 09/20/2007  . HALLUX RIGIDUS, ACQUIRED 09/20/2007    [facility] ,[place], CCC-SLP  08/17/2017, 5:00 PM  [facility] [address] [place], [place], [address] Phone: [phone]   Fax:  [phone]   Name: Jagger Beahm MRN: [id number] Date of Birth: 12-07-1950

## 2017-08-19 ENCOUNTER — Encounter: Payer: Self-pay | Admitting: Occupational Therapy

## 2017-08-19 ENCOUNTER — Ambulatory Visit: Payer: Medicare Other | Admitting: Occupational Therapy

## 2017-08-19 DIAGNOSIS — R2681 Unsteadiness on feet: Secondary | ICD-10-CM

## 2017-08-19 DIAGNOSIS — R29818 Other symptoms and signs involving the nervous system: Secondary | ICD-10-CM

## 2017-08-19 DIAGNOSIS — R293 Abnormal posture: Secondary | ICD-10-CM | POA: Diagnosis not present

## 2017-08-19 DIAGNOSIS — M6281 Muscle weakness (generalized): Secondary | ICD-10-CM

## 2017-08-19 DIAGNOSIS — R278 Other lack of coordination: Secondary | ICD-10-CM

## 2017-08-19 DIAGNOSIS — R41844 Frontal lobe and executive function deficit: Secondary | ICD-10-CM

## 2017-08-19 NOTE — Therapy (Signed)
[facility] [address] [place], [place], [address] Phone: [phone]   Fax:  [phone]  Occupational Therapy Treatment  Patient Details  Name: Judy Spears MRN: [id number] Date of Birth: Jun 09, 1950 Referring Provider: Dr. Levada Schilling   Encounter Date: 08/19/2017  OT End of Session - 08/19/17 1446    Visit Number  12    Number of Visits  24    Date for OT Re-Evaluation  08/03/17    Authorization Type  medicare maintenance program - pt to be reassessed every 30 days per Medicare guidelines  Will need PN every 10th visit    Authorization Time Period  90 days - 09/28/2017    OT Start Time  1100    OT Stop Time  1150    OT Time Calculation (min)  50 min    Activity Tolerance  Patient tolerated treatment well    Behavior During Therapy  [facility] for tasks assessed/performed       Past Medical History:  Diagnosis Date  . Arthritis    lt great toe  . Complication of anesthesia    pt has had headaches post op-did advise to hydra well preop  . Hair loss    right-sided  . History of exercise stress test    a. ETT (12/15) with 12:00 exercise, no ST changes, occasional PACs.   . Hyperlipidemia   . Insomnia   . Migraine headache   . Movement disorder    resltess in left legs  . Postmenopausal   . 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 Premature atrial contractions    a. Holter (12/15) with 8% PACs, no atrial runs or atrial fibrillation.     Past Surgical History:  Procedure Laterality Date  . BREAST BIOPSY  01/01/2011   Procedure: BREAST BIOPSY WITH NEEDLE LOCALIZATION;  Surgeon: Rolm Bookbinder, MD;  Location: [facility];  Service: General;  Laterality: Left;  left breast wire localization biopsy  . BREAST BIOPSY Right 07/27/2012  . BREAST BIOPSY Right 07/07/2016  . BREAST BIOPSY Left 06/30/2016  . cataracts right eye    . CESAREAN SECTION  1986  . HERNIA REPAIR  2000   RIH  . INTRAMEDULLARY  (IM) NAIL INTERTROCHANTERIC Left 01/30/2017   Procedure: ORIF AFFIXUS NAIL;  Surgeon: Paralee Cancel, MD;  Location: [facility];  Service: Orthopedics;  Laterality: Left;  . IR REPLC GASTRO/COLONIC TUBE PERCUT W/FLUORO  09/29/2016  . opirectinal membrane peel    . PEG PLACEMENT    . RHINOPLASTY  1983  . TRACHEOSTOMY    . TRACHEOSTOMY TUBE PLACEMENT N/A 12/15/2016   Procedure: TRACHEOSTOMY CHANGE;  Surgeon: [name] Montane, MD;  Location: [facility];  Service: ENT;  Laterality: N/A;    There were no vitals filed for this visit.  Subjective Assessment - 08/19/17 1440    Subjective   Patient with extremely tense right leg.  Per family/ caregiver - this has been an issue all morning    Pertinent History  see epic snapshot - ABI/encephalitis/hypoxia from virus; recent hospitalization due to MRSA PNA     Patient Stated Goals  Pt unable to state.     Currently in Pain?  Yes    Pain Score  2     Pain Location  Leg    Pain Orientation  Right    Pain Descriptors / Indicators  Aching    Pain Type  Acute pain    Pain Onset  Today    Aggravating Factors  tension    Pain Relieving Factors  repositioning (flexion)                    OT Treatments/Exercises (OP) - 08/19/17 0001      Neurological Re-education Exercises   Other Exercises 1  Patient with increased spasticity in right LE today - difficult to obtain knee flexion without forced hip and knee flexion.  No apparent fever, O2 sats 88-90%- color good.  Patient states she feels ok - "leg aches", caregiver indicates she gave tylenol prior to this session.  Patient did have to have bolus tube feeds (vs continuous) last night due to error in supplies sent.  In the past bolus feeds have lead to stomach upset / pain - question if this is factor.  Worked to reduce LE and postural tension by encouraging flexion and flexion / rotation patterns while seated on ball.  Continuing to encourage lower trunk initiated weight shifts and reducing overall muscle  tension in head/ neck and upoper trunk.  Patient's leg more relaxed at end of session.               OT Education - 08/19/17 1445    Education provided  Yes    Education Details  reviewed with husband and caregiver Judy Spears the benefit of flexion (hips/ knee/trunk) to aide with extensor spasticity    Person(s) Educated  Patient;Spouse;Caregiver(s)    Methods  Explanation;Demonstration    Comprehension  Verbalized understanding       OT Short Term Goals - 07/14/17 1433      OT SHORT TERM GOAL #1   Title  --    Status  --      OT SHORT TERM GOAL #2   Title  --    Status  --      OT SHORT TERM GOAL #6   Title  --      OT SHORT TERM GOAL #7   Title  --        OT Long Term Goals - 08/08/17 1252      OT LONG TERM GOAL #1   Title  Pt will maintain ability to roll in bed during self care tasks with no more than max a x1 - reasssess 08/31/2017    Status  On-going      OT LONG TERM GOAL #2   Title  Pt will maintain the ability to use roll in shower chair with care giver assistance     Status  On-going      OT LONG TERM GOAL #3   Title  Pt will maintain ability to transfer to toilet with no more than max a x1 using Steady Assist - 10/06/2017      OT LONG TERM GOAL #4   Title  Pt wil maintain ability for dressing at max a x1.     Status  On-going      OT LONG TERM GOAL #5   Title  Pt will maintain ability to stand with max a x1 to for clothing mgmt with toileting.     Status  On-going      OT LONG TERM GOAL #6   Title  Pt will maintain ability to participate in functional transfers, as well as stand to sit and sit to stand with no more than max a x1    Status  On-going      OT LONG TERM GOAL #7   Title  Pt will maintain functional mobility and trunk control  to aid in her ability to clear sucretions and assist with phonation to express basic needs/wants    Status  On-going            Plan - 08/19/17 1446    Clinical Impression Statement  Patient with increase  spasticity - significant_ in Le's and trunk today.      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    Occupational performance deficits (Please refer to evaluation for details):  ADL's;IADL's;Rest and Sleep;Leisure;Social Participation;Work    Neurosurgeon    Current Impairments/barriers affecting progress:  severity of multi system deficits, intermittent medical issues    OT Frequency  2x / week    OT Duration  12 weeks    OT Treatment/Interventions  Self-care/ADL training;Ultrasound;Moist Heat;Electrical Stimulation;DME and/or AE instruction;Neuromuscular education;Therapeutic exercise;Functional Mobility Training;Manual Therapy;Passive range of motion;Splinting;Therapeutic activities;Balance training;Patient/family education;Visual/perceptual remediation/compensation;Cognitive remediation/compensation    Plan  address rigidity and tone in trunk and extremeties, NMR trunk and LE's for transitional movements, static standing, sitting balance, sit to stand, stand to sit, stepping;     Consulted and Agree with Plan of Care  Patient;Family member/caregiver    Family Member Consulted  caregiver Judy Spears       Patient will benefit from skilled therapeutic intervention in order to improve the following deficits and impairments:  Decreased endurance, Decreased skin integrity, Impaired vision/preception, Improper body mechanics, Impaired perceived functional ability, Decreased knowledge of precautions, Cardiopulmonary status limiting activity, Decreased activity tolerance, Decreased knowledge of use of DME, Decreased strength, Impaired flexibility, Improper spinal/pelvic alignment, Impaired sensation, Difficulty walking, Decreased mobility, Decreased balance, Decreased cognition,  Decreased range of motion, Impaired tone, Impaired UE functional use, Decreased safety awareness, Decreased coordination, Pain, Abnormal gait  Visit Diagnosis: Muscle weakness (generalized)  Other symptoms and signs involving the nervous system  Abnormal posture  Unsteadiness on feet  Other lack of coordination  Frontal lobe and executive function deficit    Problem List Patient Active Problem List   Diagnosis Date Noted  . Coagulase negative Staphylococcus bacteremia 02/09/2017  . Abdominal distension   . Chronic mucus hypersecretion, respiratory 12/31/2016  . Severe protein-calorie malnutrition [name]: less than 60% of standard weight) ([place]) 12/31/2016  . Acute urinary retention 12/31/2016  . Constipation 12/31/2016  . Shortness of breath   . Respiratory distress   . Pneumonia 12/05/2016  . Acute respiratory failure ([place]) 12/05/2016  . Pseudomonas urinary tract infection 10/27/2016  . Severe muscle deconditioning 09/30/2016  . Pressure injury of skin 09/28/2016  . Respiratory failure ([place])   . Tracheobronchitis   . Urinary retention   . PEG (percutaneous endoscopic gastrostomy) status ([place])   . Benign essential HTN   . Hyponatremia   . Hypokalemia   . Reactive hypertension   . Respiratory insufficiency 09/24/2016  . Malignant neoplasm of upper-inner quadrant of left breast in female, estrogen receptor positive ([place]) 07/14/2016  . Heterotopic ossification of bone 03/10/2016  . Acute on chronic respiratory failure with hypoxia ([place])   . Acute hypoxemic respiratory failure ([place])   . Acute blood loss anemia   . Atelectasis 02/04/2016  . Debility 02/03/2016  . Pneumonia of both lower lobes due to Pseudomonas species ([place])   . Pneumonia of both lower lobes due to methicillin susceptible Staphylococcus aureus (MSSA) ([place])   . Acute respiratory failure with hypoxia ([place])   .  Acute encephalopathy   . Seizures ([place])   . Sepsis ([place])   . Hypoxia   . Pain   . Chronic  respiratory failure ([place]) 12/28/2015  . Acute tracheobronchitis 12/24/2015  . Tracheostomy tube present ([place])   . Tracheal stenosis   . Chronic respiratory failure with hypoxia ([place])   . Spastic tetraplegia ([place]) 10/20/2015  . Tracheostomy status ([place]) 09/26/2015  . Allergic rhinitis 09/26/2015  . 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 Movement disorder 09/22/2015  . Encephalitis 09/22/2015  . Chest pain 02/07/2014  . Premature atrial contractions 01/13/2014  . Abnormality of gait 12/04/2013  . Hyperlipidemia 12/21/2011  . Cardiovascular risk factor 12/21/2011  . Bunion 01/31/2008  . Metatarsalgia of both feet 09/20/2007  . FLAT FOOT 09/20/2007  . HALLUX RIGIDUS, ACQUIRED 09/20/2007    Judy Spears , OTR/L 08/19/2017, 2:48 PM  [facility] [address] [place], [place], [address] Phone: [phone]   Fax:  [phone]  Name: Judy Spears MRN: [id number] Date of Birth: April 06, 1950

## 2017-08-24 ENCOUNTER — Encounter: Payer: Self-pay | Admitting: Occupational Therapy

## 2017-08-24 ENCOUNTER — Ambulatory Visit: Payer: Medicare Other | Admitting: Occupational Therapy

## 2017-08-24 DIAGNOSIS — M6281 Muscle weakness (generalized): Secondary | ICD-10-CM

## 2017-08-24 DIAGNOSIS — R278 Other lack of coordination: Secondary | ICD-10-CM

## 2017-08-24 DIAGNOSIS — R29818 Other symptoms and signs involving the nervous system: Secondary | ICD-10-CM

## 2017-08-24 DIAGNOSIS — R293 Abnormal posture: Secondary | ICD-10-CM | POA: Diagnosis not present

## 2017-08-24 DIAGNOSIS — R41844 Frontal lobe and executive function deficit: Secondary | ICD-10-CM

## 2017-08-24 DIAGNOSIS — R2681 Unsteadiness on feet: Secondary | ICD-10-CM

## 2017-08-24 NOTE — Therapy (Signed)
[facility] [address] [place], [place], [address] Phone: [phone]   Fax:  [phone]  Occupational Therapy Treatment  Patient Details  Name: Judy Spears MRN: [id number] Date of Birth: 08-27-1950 Referring Provider: Dr. Levada Schilling   Encounter Date: 08/24/2017  OT End of Session - 08/24/17 1604    Visit Number  13    Number of Visits  24    Date for OT Re-Evaluation  09/28/17    Authorization Type  medicare maintenance program - pt to be reassessed every 30 days per Medicare guidelines  Will need PN every 10th visit    Authorization Time Period  90 days - 09/28/2017    OT Start Time  1447    OT Stop Time  1530    OT Time Calculation (min)  43 min    Activity Tolerance  Patient tolerated treatment well    Behavior During Therapy  [facility] for tasks assessed/performed       Past Medical History:  Diagnosis Date  . Arthritis    lt great toe  . Complication of anesthesia    pt has had headaches post op-did advise to hydra well preop  . Hair loss    right-sided  . History of exercise stress test    a. ETT (12/15) with 12:00 exercise, no ST changes, occasional PACs.   . Hyperlipidemia   . Insomnia   . Migraine headache   . Movement disorder    resltess in left legs  . Postmenopausal   . 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 Premature atrial contractions    a. Holter (12/15) with 8% PACs, no atrial runs or atrial fibrillation.     Past Surgical History:  Procedure Laterality Date  . BREAST BIOPSY  01/01/2011   Procedure: BREAST BIOPSY WITH NEEDLE LOCALIZATION;  Surgeon: Rolm Bookbinder, MD;  Location: [facility];  Service: General;  Laterality: Left;  left breast wire localization biopsy  . BREAST BIOPSY Right 07/27/2012  . BREAST BIOPSY Right 07/07/2016  . BREAST BIOPSY Left 06/30/2016  . cataracts right eye    . CESAREAN SECTION  1986  . HERNIA REPAIR  2000   RIH  . INTRAMEDULLARY  (IM) NAIL INTERTROCHANTERIC Left 01/30/2017   Procedure: ORIF AFFIXUS NAIL;  Surgeon: Paralee Cancel, MD;  Location: [facility];  Service: Orthopedics;  Laterality: Left;  . IR REPLC GASTRO/COLONIC TUBE PERCUT W/FLUORO  09/29/2016  . opirectinal membrane peel    . PEG PLACEMENT    . RHINOPLASTY  1983  . TRACHEOSTOMY    . TRACHEOSTOMY TUBE PLACEMENT N/A 12/15/2016   Procedure: TRACHEOSTOMY CHANGE;  Surgeon: [name] Montane, MD;  Location: [facility];  Service: ENT;  Laterality: N/A;    There were no vitals filed for this visit.  Subjective Assessment - 08/24/17 1559    Subjective   What was your meeting about?  (Patient asking husband)    Pertinent History  see epic snapshot - ABI/encephalitis/hypoxia from virus; recent hospitalization due to MRSA PNA     Patient Stated Goals  Pt unable to state.     Currently in Pain?  No/denies    Pain Score  0-No pain                   OT Treatments/Exercises (OP) - 08/24/17 0001      Neurological Re-education Exercises   Other Exercises 1  Patient's caregiver brought physioball from home to determine if this could be used for  sitting balance.  Transferred from w/c to mat table to ball - as caregiver would do in home.  Discussed the importance of tucking ball against a solid surface to stabilize ball for transfer.  Discussed where ion the house this could occur.  Plan was for use near massage table.  Patient's physioball is 65cm whereas ball in clinic is 75cm and more inflated.  Patient unable to achieve safe sitting posture on lower ball.  Husband ordered a larger ball for home use.  Worked with caregiver to transfer onto off of physioball onto level surface - with emphasis of patient controling direction of hips.  Worked with caregiver to determine vatrious movements that would be helpful to reinforce right lat trunk flexion, with movement initiated from hips.  Used flexion rotation apttenrs to further encourage this motion.  Will need further practice with  caregiver if this is to be an aspec to fthe HEP.               OT Education - 08/24/17 1604    Education provided  Yes    Education Details  WORKED WITH CAREGIVER ON POSSIBLE USES OF PHYSIOBALL TO ENHANCE STRETCHING AND ACTIVATION IN HOME    Person(s) Educated  Patient;Spouse;Caregiver(s)    Methods  Explanation;Demonstration    Comprehension  Verbalized understanding;Need further instruction       OT Short Term Goals - 07/14/17 1433      OT SHORT TERM GOAL #1   Title  --    Status  --      OT SHORT TERM GOAL #2   Title  --    Status  --      OT SHORT TERM GOAL #6   Title  --      OT SHORT TERM GOAL #7   Title  --        OT Long Term Goals - 08/08/17 1252      OT LONG TERM GOAL #1   Title  Pt will maintain ability to roll in bed during self care tasks with no more than max a x1 - reasssess 08/31/2017    Status  On-going      OT LONG TERM GOAL #2   Title  Pt will maintain the ability to use roll in shower chair with care giver assistance     Status  On-going      OT LONG TERM GOAL #3   Title  Pt will maintain ability to transfer to toilet with no more than max a x1 using Steady Assist - 10/06/2017      OT LONG TERM GOAL #4   Title  Pt wil maintain ability for dressing at max a x1.     Status  On-going      OT LONG TERM GOAL #5   Title  Pt will maintain ability to stand with max a x1 to for clothing mgmt with toileting.     Status  On-going      OT LONG TERM GOAL #6   Title  Pt will maintain ability to participate in functional transfers, as well as stand to sit and sit to stand with no more than max a x1    Status  On-going      OT LONG TERM GOAL #7   Title  Pt will maintain functional mobility and trunk control to aid in her ability to clear sucretions and assist with phonation to express basic needs/wants    Status  On-going  Plan - 08/24/17 1605    Clinical Impression Statement  Patient alert and able to actively participate  throughout session today.  Patient vocalizing / verbalizing louder today than usual, and very persistent that her language be understood.      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    Occupational performance deficits (Please refer to evaluation for details):  ADL's;IADL's;Rest and Sleep;Leisure;Social Participation;Work    Neurosurgeon    Current Impairments/barriers affecting progress:  severity of multi system deficits, intermittent medical issues    OT Frequency  2x / week    OT Duration  12 weeks    OT Treatment/Interventions  Self-care/ADL training;Ultrasound;Moist Heat;Electrical Stimulation;DME and/or AE instruction;Neuromuscular education;Therapeutic exercise;Functional Mobility Training;Manual Therapy;Passive range of motion;Splinting;Therapeutic activities;Balance training;Patient/family education;Visual/perceptual remediation/compensation;Cognitive remediation/compensation    Plan  address rigidity and tone in trunk and extremeties, NMR trunk and LE's for transitional movements, static standing, sitting balance, sit to stand, stand to sit, stepping;     Consulted and Agree with Plan of Care  Patient;Family member/caregiver    Family Member Consulted  caregiver Le[place]       Patient will benefit from skilled therapeutic intervention in order to improve the following deficits and impairments:  Decreased endurance, Decreased skin integrity, Impaired vision/preception, Improper body mechanics, Impaired perceived functional ability, Decreased knowledge of precautions, Cardiopulmonary status limiting activity, Decreased activity tolerance, Decreased knowledge of use of DME, Decreased strength, Impaired flexibility, Improper spinal/pelvic alignment, Impaired sensation,  Difficulty walking, Decreased mobility, Decreased balance, Decreased cognition, Decreased range of motion, Impaired tone, Impaired UE functional use, Decreased safety awareness, Decreased coordination, Pain, Abnormal gait  Visit Diagnosis: Abnormal posture  Other symptoms and signs involving the nervous system  Muscle weakness (generalized)  Unsteadiness on feet  Other lack of coordination  Frontal lobe and executive function deficit    Problem List Patient Active Problem List   Diagnosis Date Noted  . Coagulase negative Staphylococcus bacteremia 02/09/2017  . Abdominal distension   . Chronic mucus hypersecretion, respiratory 12/31/2016  . Severe protein-calorie malnutrition [name]: less than 60% of standard weight) ([facility]) 12/31/2016  . Acute urinary retention 12/31/2016  . Constipation 12/31/2016  . Shortness of breath   . Respiratory distress   . Pneumonia 12/05/2016  . Acute respiratory failure ([place]) 12/05/2016  . Pseudomonas urinary tract infection 10/27/2016  . Severe muscle deconditioning 09/30/2016  . Pressure injury of skin 09/28/2016  . Respiratory failure ([place])   . Tracheobronchitis   . Urinary retention   . PEG (percutaneous endoscopic gastrostomy) status ([facility])   . Benign essential HTN   . Hyponatremia   . Hypokalemia   . Reactive hypertension   . Respiratory insufficiency 09/24/2016  . Malignant neoplasm of upper-inner quadrant of left breast in female, estrogen receptor positive ([place]) 07/14/2016  . Heterotopic ossification of bone 03/10/2016  . Acute on chronic respiratory failure with hypoxia ([place])   . Acute hypoxemic respiratory failure ([place])   . Acute blood loss anemia   . Atelectasis 02/04/2016  . Debility 02/03/2016  . Pneumonia of both lower lobes due to Pseudomonas species ([place])   . Pneumonia of both lower lobes due to methicillin susceptible Staphylococcus aureus (MSSA) ([place])   . Acute respiratory failure with hypoxia ([place])   . Acute  encephalopathy   . Seizures ([place])   . Sepsis ([place])   .  Hypoxia   . Pain   . Chronic respiratory failure ([place]) 12/28/2015  . Acute tracheobronchitis 12/24/2015  . Tracheostomy tube present ([place])   . Tracheal stenosis   . Chronic respiratory failure with hypoxia ([facility])   . Spastic tetraplegia ([place]) 10/20/2015  . Tracheostomy status ([place]) 09/26/2015  . Allergic rhinitis 09/26/2015  . 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 Movement disorder 09/22/2015  . Encephalitis 09/22/2015  . Chest pain 02/07/2014  . Premature atrial contractions 01/13/2014  . Abnormality of gait 12/04/2013  . Hyperlipidemia 12/21/2011  . Cardiovascular risk factor 12/21/2011  . Bunion 01/31/2008  . Metatarsalgia of both feet 09/20/2007  . FLAT FOOT 09/20/2007  . HALLUX RIGIDUS, ACQUIRED 09/20/2007    Mariah Milling, OTR/L 08/24/2017, 4:07 PM  [facility] [address] [place], [place], [address] Phone: [phone]   Fax:  [phone]  Name: Judy Spears MRN: [id number] Date of Birth: 28-Aug-1950

## 2017-08-26 ENCOUNTER — Ambulatory Visit: Payer: Medicare Other | Admitting: Occupational Therapy

## 2017-08-26 ENCOUNTER — Ambulatory Visit: Payer: Medicare Other | Attending: Physician Assistant

## 2017-08-26 DIAGNOSIS — R29818 Other symptoms and signs involving the nervous system: Secondary | ICD-10-CM | POA: Diagnosis present

## 2017-08-26 DIAGNOSIS — M25652 Stiffness of left hip, not elsewhere classified: Secondary | ICD-10-CM | POA: Insufficient documentation

## 2017-08-26 DIAGNOSIS — R471 Dysarthria and anarthria: Secondary | ICD-10-CM | POA: Diagnosis present

## 2017-08-26 DIAGNOSIS — R41841 Cognitive communication deficit: Secondary | ICD-10-CM | POA: Insufficient documentation

## 2017-08-26 DIAGNOSIS — R293 Abnormal posture: Secondary | ICD-10-CM | POA: Insufficient documentation

## 2017-08-26 DIAGNOSIS — M25611 Stiffness of right shoulder, not elsewhere classified: Secondary | ICD-10-CM | POA: Diagnosis present

## 2017-08-26 DIAGNOSIS — R4701 Aphasia: Secondary | ICD-10-CM | POA: Diagnosis present

## 2017-08-26 DIAGNOSIS — M25622 Stiffness of left elbow, not elsewhere classified: Secondary | ICD-10-CM | POA: Diagnosis present

## 2017-08-26 DIAGNOSIS — R2689 Other abnormalities of gait and mobility: Secondary | ICD-10-CM | POA: Diagnosis present

## 2017-08-26 DIAGNOSIS — R41844 Frontal lobe and executive function deficit: Secondary | ICD-10-CM | POA: Diagnosis present

## 2017-08-26 DIAGNOSIS — R1312 Dysphagia, oropharyngeal phase: Secondary | ICD-10-CM | POA: Diagnosis present

## 2017-08-26 DIAGNOSIS — R2681 Unsteadiness on feet: Secondary | ICD-10-CM | POA: Diagnosis present

## 2017-08-26 DIAGNOSIS — M25612 Stiffness of left shoulder, not elsewhere classified: Secondary | ICD-10-CM | POA: Insufficient documentation

## 2017-08-26 DIAGNOSIS — M25651 Stiffness of right hip, not elsewhere classified: Secondary | ICD-10-CM | POA: Insufficient documentation

## 2017-08-26 DIAGNOSIS — M6281 Muscle weakness (generalized): Secondary | ICD-10-CM | POA: Insufficient documentation

## 2017-08-26 DIAGNOSIS — G8250 Quadriplegia, unspecified: Secondary | ICD-10-CM | POA: Diagnosis present

## 2017-08-26 NOTE — Therapy (Signed)
[facility] [address] [place], [place], [address] Phone: [phone]   Fax:  [phone]  Speech Language Pathology Treatment  Patient Details  Name: Judy Spears MRN: [id number] Date of Birth: 04/26/1950 Referring Provider: Levada Schilling, MD   Encounter Date: 08/26/2017  End of Session - 08/26/17 1641    Visit Number  20    Number of Visits  21    Date for SLP Re-Evaluation  09/09/17    SLP Start Time  1405    SLP Stop Time   1446    SLP Time Calculation (min)  41 min       Past Medical History:  Diagnosis Date  . Arthritis    lt great toe  . Complication of anesthesia    pt has had headaches post op-did advise to hydra well preop  . Hair loss    right-sided  . History of exercise stress test    a. ETT (12/15) with 12:00 exercise, no ST changes, occasional PACs.   . Hyperlipidemia   . Insomnia   . Migraine headache   . Movement disorder    resltess in left legs  . Postmenopausal   . 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 Premature atrial contractions    a. Holter (12/15) with 8% PACs, no atrial runs or atrial fibrillation.     Past Surgical History:  Procedure Laterality Date  . BREAST BIOPSY  01/01/2011   Procedure: BREAST BIOPSY WITH NEEDLE LOCALIZATION;  Surgeon: Rolm Bookbinder, MD;  Location: [facility];  Service: General;  Laterality: Left;  left breast wire localization biopsy  . BREAST BIOPSY Right 07/27/2012  . BREAST BIOPSY Right 07/07/2016  . BREAST BIOPSY Left 06/30/2016  . cataracts right eye    . CESAREAN SECTION  1986  . HERNIA REPAIR  2000   RIH  . INTRAMEDULLARY (IM) NAIL INTERTROCHANTERIC Left 01/30/2017   Procedure: ORIF AFFIXUS NAIL;  Surgeon: Paralee Cancel, MD;  Location: [facility];  Service: Orthopedics;  Laterality: Left;  . IR REPLC GASTRO/COLONIC TUBE PERCUT W/FLUORO  09/29/2016  . opirectinal membrane peel    . PEG PLACEMENT    . RHINOPLASTY  1983  .  TRACHEOSTOMY    . TRACHEOSTOMY TUBE PLACEMENT N/A 12/15/2016   Procedure: TRACHEOSTOMY CHANGE;  Surgeon: [name] Montane, MD;  Location: [facility];  Service: ENT;  Laterality: N/A;    There were no vitals filed for this visit.  Subjective Assessment - 08/26/17 1629    Subjective  Pt enters with pharyngeal/laryngeal congestion.    Patient is accompained by:  Family member Judy Spears, and Judy Spears (aide)    Currently in Pain?  No/denies none indicated            ADULT SLP TREATMENT - 08/26/17 1630      General Information   Behavior/Cognition  Alert;Cooperative;Lethargic somnolent x6 today       Treatment Provided   Treatment provided  Cognitive-Linquistic      Cognitive-Linquistic Treatment   Treatment focused on  Dysarthria;Patient/family/caregiver education    Skilled Treatment  Pt arrived today with pharyngeal/laryngeal congestion which persisted throughout therapy session. This subsided for approx 5 minutes after sips of water were provided by husband without oral care prior. Spontaneous one word at a time production by pt approx 20% of the time, and this was initiated 20% of the time if pt was asked to repeat. SLP coached pt's aide (Judy Spears) on good and appropriate cueing for articulation by pt as  well as for techniques to try to incr pt's participation in [place]. At times pt demo'd vocal arrest leading to aphonia 30% of the time with imitative or spontaneous productions. Pt was somnolent requiring tactile and/or verbal cues to wake x6. Pt spelled her response x1 independently and this incr'd SLP's understanding of pt's message. Specific targeting of bilabials in initial and final positions and alveolars in initial positions today, pt imitation was successful (functionally) mostly with cues for repeat.       Assessment / Recommendations / Plan   Plan  Continue with current plan of care next week move to maintenance schedule once/month      Progression Toward Goals   Progression toward goals  Not  progressing toward goals (comment) somnolent, hydrophonic voice hindered progress today         SLP Short Term Goals - 06/27/17 1750      SLP SHORT TERM GOAL #1   Title  Pt will demonstrate dysarthria HEP to increase articulatory coordination for speech with usual mod A over 3 sessions    Status  Deferred      SLP SHORT TERM GOAL #2   Title  Pt will achieve phonation adequate for SLP intelligibility in imitated two-syllable words using intelligibility compensations 12/20 trials with usual min A in 3 sessions    Status  Not Met      SLP SHORT TERM GOAL #3   Title  Pt will imitate 2-syllable words or phrases with functional intelligibility for SLP in 5/15 trials over 3 sessions     Status  Not Met      SLP SHORT TERM GOAL #4   Title  Pt will sustain attention to therapy tasks for 10 minutes with multimodal cues in 5 sessions    Status  Partially Met      SLP SHORT TERM GOAL #5   Title  pt family will demo understanding of oral care regimen, including safe ingestion of water/ice chips over three sessions    Status  Achieved      SLP SHORT TERM GOAL #6   Title  pt will demo dysphagia HEP to improve management of secretions and safety with any possible POs    Status  Deferred      SLP SHORT TERM GOAL #7   Title  family/caregivers will demo understanding of pt swallowing precautions, for pt safety with POs, by verbal report     Status  Achieved       SLP Long Term Goals - 08/26/17 1642      SLP LONG TERM GOAL #1   Title  Pt will demo dysarthria  HEP to incr articulatory coordination for speech, with occasional mod A over three sessions    Status  Not Met      SLP LONG TERM GOAL #2   Title  pt will achieve phonation adequate for SLP intelligibility in imitated 3-syllable words/3-word phrases using intelligibility compenastions 15/20 trials    Time  2    Period  Weeks    Status  On-going and ongoing      SLP LONG TERM GOAL #3   Title  Pt will imitate 2-syllable words or  phrases with functional intelligibility for SLP in 8/15 trials over three sessions    Status  Partially Met 1/3 session      SLP LONG TERM GOAL #4   Title  pt will sustain attention to therapy tasks for 10 minutes with multimodal cues in 10 sessions  Baseline  06-10-17, 07-04-17, 07-06-17; 07-12-17, 07-14-17, 08-10-17, 08-17-17    Time  2    Period  Weeks    Status  On-going and ongoing      SLP LONG TERM GOAL #5   Title  pt will demo dysphagia HEP to improve management of secretions and safety with possible POs    Status  Deferred      SLP LONG TERM GOAL #6   Title  pt's family will demo knowledge about how best to work with pt speech/language/swallow deficits in each session over the next four weeks    Baseline  08-17-17, 08-26-17    Time  2    Period  Weeks    Status  On-going       Plan - 08/26/17 1641    Clinical Impression Statement  Patient presents with ongoing deficits in speech intelligibility (dysarthria, reduced breath support), cognitive communication and swallowing. See skilled treatment for details of voicing, functional speech intelligilibty for imitation and spontaneous production of 1-4 syllable words/phrases today. Pt will cont to be seen for approx 1 more session stressing family education on how best to work with pt after d/c and for SLP to model these techniques for aide and family. At that time pt will switch to maintenance therapy to be seen once/month.     Speech Therapy Frequency  1x /week    Duration  4 weeks    Treatment/Interventions  [place] precaution training;Trials of upgraded texture/liquids;Compensatory strategies;Functional tasks;Patient/family education;Cueing hierarchy;Diet toleration management by SLP;Cognitive reorganization;Compensatory techniques;SLP instruction and feedback;Internal/external aids;Multimodal communcation approach;Pharyngeal strengthening exercises;Oral motor exercises    Potential to Achieve Goals  Fair    Potential Considerations   Ability to learn/carryover information;Co-morbidities;Severity of impairments;Cooperation/participation level;Previous level of function    Consulted and Agree with Plan of Care  Patient;Family member/caregiver    Family Member Consulted  Husband, Judy Spears       Patient will benefit from skilled therapeutic intervention in order to improve the following deficits and impairments:   Dysarthria and anarthria  Cognitive communication deficit  Dysphagia, oropharyngeal phase  Aphasia    Problem List Patient Active Problem List   Diagnosis Date Noted  . Coagulase negative Staphylococcus bacteremia 02/09/2017  . Abdominal distension   . Chronic mucus hypersecretion, respiratory 12/31/2016  . Severe protein-calorie malnutrition [name]: less than 60% of standard weight) ([place]) 12/31/2016  . Acute urinary retention 12/31/2016  . Constipation 12/31/2016  . Shortness of breath   . Respiratory distress   . Pneumonia 12/05/2016  . Acute respiratory failure ([place]) 12/05/2016  . Pseudomonas urinary tract infection 10/27/2016  . Severe muscle deconditioning 09/30/2016  . Pressure injury of skin 09/28/2016  . Respiratory failure ([place])   . Tracheobronchitis   . Urinary retention   . PEG (percutaneous endoscopic gastrostomy) status ([place])   . Benign essential HTN   . Hyponatremia   . Hypokalemia   . Reactive hypertension   . Respiratory insufficiency 09/24/2016  . Malignant neoplasm of upper-inner quadrant of left breast in female, estrogen receptor positive ([place]) 07/14/2016  . Heterotopic ossification of bone 03/10/2016  . Acute on chronic respiratory failure with hypoxia ([place])   . Acute hypoxemic respiratory failure ([place])   . Acute blood loss anemia   . Atelectasis 02/04/2016  . Debility 02/03/2016  . Pneumonia of both lower lobes due to Pseudomonas species ([place])   . Pneumonia of both lower lobes due to methicillin susceptible Staphylococcus aureus (MSSA) ([place])   . Acute respiratory failure  with hypoxia (  [place])   . Acute encephalopathy   . Seizures ([place])   . Sepsis ([place])   . Hypoxia   . Pain   . Chronic respiratory failure ([place]) 12/28/2015  . Acute tracheobronchitis 12/24/2015  . Tracheostomy tube present ([place])   . Tracheal stenosis   . Chronic respiratory failure with hypoxia ([place])   . Spastic tetraplegia ([place]) 10/20/2015  . Tracheostomy status ([place]) 09/26/2015  . Allergic rhinitis 09/26/2015  . 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 Movement disorder 09/22/2015  . Encephalitis 09/22/2015  . Chest pain 02/07/2014  . Premature atrial contractions 01/13/2014  . Abnormality of gait 12/04/2013  . Hyperlipidemia 12/21/2011  . Cardiovascular risk factor 12/21/2011  . Bunion 01/31/2008  . Metatarsalgia of both feet 09/20/2007  . FLAT FOOT 09/20/2007  . HALLUX RIGIDUS, ACQUIRED 09/20/2007    [facility] ,MS, CCC-SLP  08/26/2017, 4:44 PM  [facility] [address] [place], [place], [address] Phone: [phone]   Fax:  [phone]   Name: Judy Spears MRN: [id number] Date of Birth: [birth year]

## 2017-08-26 NOTE — Patient Instructions (Signed)
Continue work with P,B, M, and also T,D (primarily) but also N, and L.   Water only after oral care due to oral bacteria prevalent if no oral care <30 minutes earlier. Bacteria swallowed and aspirated can lead to pneumonia.

## 2017-08-29 ENCOUNTER — Encounter: Payer: Self-pay | Admitting: Occupational Therapy

## 2017-08-29 ENCOUNTER — Ambulatory Visit: Payer: Medicare Other | Admitting: Occupational Therapy

## 2017-08-29 ENCOUNTER — Ambulatory Visit: Payer: Medicare Other | Admitting: Rehabilitation

## 2017-08-29 ENCOUNTER — Encounter: Payer: Self-pay | Admitting: Rehabilitation

## 2017-08-29 DIAGNOSIS — G8250 Quadriplegia, unspecified: Secondary | ICD-10-CM

## 2017-08-29 DIAGNOSIS — R293 Abnormal posture: Secondary | ICD-10-CM

## 2017-08-29 DIAGNOSIS — R2681 Unsteadiness on feet: Secondary | ICD-10-CM

## 2017-08-29 DIAGNOSIS — M6281 Muscle weakness (generalized): Secondary | ICD-10-CM

## 2017-08-29 DIAGNOSIS — M25612 Stiffness of left shoulder, not elsewhere classified: Secondary | ICD-10-CM

## 2017-08-29 DIAGNOSIS — R29818 Other symptoms and signs involving the nervous system: Secondary | ICD-10-CM

## 2017-08-29 DIAGNOSIS — R2689 Other abnormalities of gait and mobility: Secondary | ICD-10-CM

## 2017-08-29 DIAGNOSIS — M25651 Stiffness of right hip, not elsewhere classified: Secondary | ICD-10-CM

## 2017-08-29 DIAGNOSIS — M25622 Stiffness of left elbow, not elsewhere classified: Secondary | ICD-10-CM

## 2017-08-29 DIAGNOSIS — R471 Dysarthria and anarthria: Secondary | ICD-10-CM | POA: Diagnosis not present

## 2017-08-29 DIAGNOSIS — R41844 Frontal lobe and executive function deficit: Secondary | ICD-10-CM

## 2017-08-29 DIAGNOSIS — M25611 Stiffness of right shoulder, not elsewhere classified: Secondary | ICD-10-CM

## 2017-08-29 DIAGNOSIS — M25652 Stiffness of left hip, not elsewhere classified: Secondary | ICD-10-CM

## 2017-08-29 NOTE — Therapy (Signed)
[facility] [address] [place], [place], [address] Phone: [phone]   Fax:  [phone]  Occupational Therapy Treatment  Patient Details  Name: Judy Spears MRN: [id number] Date of Birth: [birth year] Referring Provider: Dr. Levada Schilling   Encounter Date: 08/29/2017  OT End of Session - 08/29/17 1655    Visit Number  14    Number of Visits  24    Date for OT Re-Evaluation  09/28/17    Authorization Type  medicare maintenance program - pt to be reassessed every 30 days per Medicare guidelines  Will need PN every 10th visit    Authorization Time Period  90 days - 09/28/2017    OT Start Time  1401    OT Stop Time  1442    OT Time Calculation (min)  41 min    Activity Tolerance  Patient limited by lethargy       Past Medical History:  Diagnosis Date  . Arthritis    lt great toe  . Complication of anesthesia    pt has had headaches post op-did advise to hydra well preop  . Hair loss    right-sided  . History of exercise stress test    a. ETT (12/15) with 12:00 exercise, no ST changes, occasional PACs.   . Hyperlipidemia   . Insomnia   . Migraine headache   . Movement disorder    resltess in left legs  . Postmenopausal   . 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 Premature atrial contractions    a. Holter (12/15) with 8% PACs, no atrial runs or atrial fibrillation.     Past Surgical History:  Procedure Laterality Date  . BREAST BIOPSY  01/01/2011   Procedure: BREAST BIOPSY WITH NEEDLE LOCALIZATION;  Surgeon: Rolm Bookbinder, MD;  Location: [facility];  Service: General;  Laterality: Left;  left breast wire localization biopsy  . BREAST BIOPSY Right 07/27/2012  . BREAST BIOPSY Right 07/07/2016  . BREAST BIOPSY Left 06/30/2016  . cataracts right eye    . CESAREAN SECTION  1986  . HERNIA REPAIR  2000   RIH  . INTRAMEDULLARY (IM) NAIL INTERTROCHANTERIC Left 01/30/2017   Procedure: ORIF AFFIXUS  NAIL;  Surgeon: Paralee Cancel, MD;  Location: [facility];  Service: Orthopedics;  Laterality: Left;  . IR REPLC GASTRO/COLONIC TUBE PERCUT W/FLUORO  09/29/2016  . opirectinal membrane peel    . PEG PLACEMENT    . RHINOPLASTY  1983  . TRACHEOSTOMY    . TRACHEOSTOMY TUBE PLACEMENT N/A 12/15/2016   Procedure: TRACHEOSTOMY CHANGE;  Surgeon: [name] Montane, MD;  Location: [facility];  Service: ENT;  Laterality: N/A;    There were no vitals filed for this visit.  Subjective Assessment - 08/29/17 1645    Subjective   Pt very lethargic today with eyes closed much of the session.     Pertinent History  see epic snapshot - ABI/encephalitis/hypoxia from virus; recent hospitalization due to MRSA PNA     Patient Stated Goals  Pt unable to state.     Currently in Pain?  No/denies                   OT Treatments/Exercises (OP) - 08/29/17 0001      Neurological Re-education Exercises   Other Exercises 1  Worked with caregiver [name] to address using physioball in gym with 1 other person.  Caregiver practiced transferring pt to ball with second person stabilizing ball.  Caregiver instructed in  sitting in front to faciltate forward flexion and assist in using ball to faciltate lower trunk initiated movement for A/P and lateral weight shift to the left to address elongation of trunk on left and improve postural alignment and control in sitting.  Then had caregiver move to back while therapist worked in front and instructed caregiver on how to stablize ball as well as use ball to faciliate trunk mobility, relaxation of the trunk musclulature and decrease tone.  Caregiver will need additonal instruction and practice  as well as husband if they wish to incorporate into HEP.  Caregiver will also measure height of massage table at home to determine if they will be able to use the ball with the mat to stabilize with one person assist.  Will also work on instructing caregiver in using ball in supine.                OT Education - 08/29/17 1653    Education provided  Yes    Education Details  use of physioball     Person(s) Educated  Patient;Caregiver(s)    Methods  Explanation;Demonstration    Comprehension  Verbalized understanding;Need further instruction;Returned demonstration;Verbal cues required;Tactile cues required          OT Long Term Goals - 08/29/17 1654      OT LONG TERM GOAL #1   Title  Pt will maintain ability to roll in bed during self care tasks with no more than max a x1 - reasssess 08/31/2017    Status  On-going      OT LONG TERM GOAL #2   Title  Pt will maintain the ability to use roll in shower chair with care giver assistance     Status  On-going      OT LONG TERM GOAL #3   Title  Pt will maintain ability to transfer to toilet with no more than max a x1 using Steady Assist - 10/06/2017      OT LONG TERM GOAL #4   Title  Pt wil maintain ability for dressing at max a x1.     Status  On-going      OT LONG TERM GOAL #5   Title  Pt will maintain ability to stand with max a x1 to for clothing mgmt with toileting.     Status  On-going      OT LONG TERM GOAL #6   Title  Pt will maintain ability to participate in functional transfers, as well as stand to sit and sit to stand with no more than max a x1    Status  On-going      OT LONG TERM GOAL #7   Title  Pt will maintain functional mobility and trunk control to aid in her ability to clear sucretions and assist with phonation to express basic needs/wants    Status  On-going            Plan - 08/29/17 1654    Clinical Impression Statement  Pt very lethargic today. Caregiver states she did not sleep well last night. Pt required max stimulation for arousal even working on ball today.     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    Occupational performance deficits (Please refer to evaluation for details):  ADL's;IADL's;Rest and Sleep;Leisure;Social Participation;Work    Nurse, adult  Potential  Good    Current Impairments/barriers affecting progress:  severity of multi system deficits, intermittent medical issues    OT Frequency  2x / week    OT Duration  12 weeks    OT Treatment/Interventions  Self-care/ADL training;Ultrasound;Moist Heat;Electrical Stimulation;DME and/or AE instruction;Neuromuscular education;Therapeutic exercise;Functional Mobility Training;Manual Therapy;Passive range of motion;Splinting;Therapeutic activities;Balance training;Patient/family education;Visual/perceptual remediation/compensation;Cognitive remediation/compensation    Plan  address rigidity and tone in trunk and extremeties, NMR trunk and LE's for transitional movements, static standing, sitting balance, sit to stand, stand to sit, stepping;     Consulted and Agree with Plan of Care  Patient;Family member/caregiver    Family Member Consulted  caregiver [name], husband [name]       Patient will benefit from skilled therapeutic intervention in order to improve the following deficits and impairments:  Decreased endurance, Decreased skin integrity, Impaired vision/preception, Improper body mechanics, Impaired perceived functional ability, Decreased knowledge of precautions, Cardiopulmonary status limiting activity, Decreased activity tolerance, Decreased knowledge of use of DME, Decreased strength, Impaired flexibility, Improper spinal/pelvic alignment, Impaired sensation, Difficulty walking, Decreased mobility, Decreased balance, Decreased cognition, Decreased range of motion, Impaired tone, Impaired UE functional use, Decreased safety awareness, Decreased coordination, Pain, Abnormal gait  Visit Diagnosis: Abnormal posture  Other symptoms and signs involving the nervous system  Muscle weakness  (generalized)  Unsteadiness on feet  Frontal lobe and executive function deficit  Stiffness of right shoulder, not elsewhere classified  Stiffness of left shoulder, not elsewhere classified  Stiffness of left elbow, not elsewhere classified  Spastic tetraplegia [facility])    Problem List Patient Active Problem List   Diagnosis Date Noted  . Coagulase negative Staphylococcus bacteremia 02/09/2017  . Abdominal distension   . Chronic mucus hypersecretion, respiratory 12/31/2016  . Severe protein-calorie malnutrition [name]: less than 60% of standard weight) ([place]) 12/31/2016  . Acute urinary retention 12/31/2016  . Constipation 12/31/2016  . Shortness of breath   . Respiratory distress   . Pneumonia 12/05/2016  . Acute respiratory failure ([place]) 12/05/2016  . Pseudomonas urinary tract infection 10/27/2016  . Severe muscle deconditioning 09/30/2016  . Pressure injury of skin 09/28/2016  . Respiratory failure ([place])   . Tracheobronchitis   . Urinary retention   . PEG (percutaneous endoscopic gastrostomy) status ([place])   . Benign essential HTN   . Hyponatremia   . Hypokalemia   . Reactive hypertension   . Respiratory insufficiency 09/24/2016  . Malignant neoplasm of upper-inner quadrant of left breast in female, estrogen receptor positive ([place]) 07/14/2016  . Heterotopic ossification of bone 03/10/2016  . Acute on chronic respiratory failure with hypoxia ([place])   . Acute hypoxemic respiratory failure ([place])   . Acute blood loss anemia   . Atelectasis 02/04/2016  . Debility 02/03/2016  . Pneumonia of both lower lobes due to Pseudomonas species ([place])   . Pneumonia of both lower lobes due to methicillin susceptible Staphylococcus aureus (MSSA) ([place])   . Acute respiratory failure with hypoxia ([place])   . Acute encephalopathy   . Seizures ([place])   . Sepsis ([place])   . Hypoxia   . Pain   . Chronic respiratory failure ([place]) 12/28/2015  . Acute tracheobronchitis 12/24/2015  . Tracheostomy  tube present ([place])   . Tracheal stenosis   . Chronic respiratory failure with hypoxia ([place])   . Spastic tetraplegia ([place]) 10/20/2015  . Tracheostomy status ([place]) 09/26/2015  . Allergic rhinitis 09/26/2015  . 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 Movement disorder 09/22/2015  . Encephalitis 09/22/2015  . Chest  pain 02/07/2014  . Premature atrial contractions 01/13/2014  . Abnormality of gait 12/04/2013  . Hyperlipidemia 12/21/2011  . Cardiovascular risk factor 12/21/2011  . Bunion 01/31/2008  . Metatarsalgia of both feet 09/20/2007  . FLAT FOOT 09/20/2007  . HALLUX RIGIDUS, ACQUIRED 09/20/2007    Quay Burow, OTR/L 08/29/2017, 4:59 PM  [facility] [address] [place], [place], [address] Phone: [phone]   Fax:  [phone]  Name: Judy Spears MRN: [id number] Date of Birth: May 20, 1950

## 2017-08-29 NOTE — Therapy (Signed)
[facility] [address] [place], [place], [address] Phone: [phone]   Fax:  [phone]  Physical Therapy Treatment  Patient Details  Name: Judy Spears MRN: [id number] Date of Birth: April 30, 1950 Referring Provider: Dr. Levada Schilling   Encounter Date: 08/29/2017  PT End of Session - 08/29/17 1539    Visit Number  22    Number of Visits  28    Date for PT Re-Evaluation  10/04/17    Authorization Type  [employer] Medicare    Authorization Time Period  07/12/17-10/04/17    PT Start Time  1318    PT Stop Time  1400    PT Time Calculation (min)  42 min       Past Medical History:  Diagnosis Date  . Arthritis    lt great toe  . Complication of anesthesia    pt has had headaches post op-did advise to hydra well preop  . Hair loss    right-sided  . History of exercise stress test    a. ETT (12/15) with 12:00 exercise, no ST changes, occasional PACs.   . Hyperlipidemia   . Insomnia   . Migraine headache   . Movement disorder    resltess in left legs  . Postmenopausal   . 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 Premature atrial contractions    a. Holter (12/15) with 8% PACs, no atrial runs or atrial fibrillation.     Past Surgical History:  Procedure Laterality Date  . BREAST BIOPSY  01/01/2011   Procedure: BREAST BIOPSY WITH NEEDLE LOCALIZATION;  Surgeon: Rolm Bookbinder, MD;  Location: [facility];  Service: General;  Laterality: Left;  left breast wire localization biopsy  . BREAST BIOPSY Right 07/27/2012  . BREAST BIOPSY Right 07/07/2016  . BREAST BIOPSY Left 06/30/2016  . cataracts right eye    . CESAREAN SECTION  1986  . HERNIA REPAIR  2000   RIH  . INTRAMEDULLARY (IM) NAIL INTERTROCHANTERIC Left 01/30/2017   Procedure: ORIF AFFIXUS NAIL;  Surgeon: Paralee Cancel, MD;  Location: [facility];  Service: Orthopedics;  Laterality: Left;  . IR REPLC GASTRO/COLONIC TUBE PERCUT W/FLUORO  09/29/2016  .  opirectinal membrane peel    . PEG PLACEMENT    . RHINOPLASTY  1983  . TRACHEOSTOMY    . TRACHEOSTOMY TUBE PLACEMENT N/A 12/15/2016   Procedure: TRACHEOSTOMY CHANGE;  Surgeon: [name] Montane, MD;  Location: [facility];  Service: ENT;  Laterality: N/A;    There were no vitals filed for this visit.  Subjective Assessment - 08/29/17 1538    Subjective  Caregiver reports that pt did not sleep well last night, therefore very fatigued during our session.     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  No/denies              NMR:  Worked on standing in // bars with RUE support only as when pt utilizes LUE, she tends to pull into more flexed trunk posture.  Provided facilitation at L axilla and R pelvis throughout to encourage L trunk elongation and increased L lateral weight shift to perform tasks.  Provided max A (either PT or assist from husband) to maintain more upright posture.  While in standing had pt initiate L lateral  weight shift however she is only able to achieve midline without assist from PT. Also had pt perform R leg lifts posteriorly as to stand on LLE only to improve L lateral weight shift and LLE WB via forced use.  Max A to maintain posture throughout.  Assisted pt with placing RLE on soccer ball again to increase L lateral weight shift and WB however pt tends to place too much pressure through ball.  Max facilitation as mentioned above to increase weight shift and also decrease trunk rotation.  Pt did have single successful lift of RLE during session, however note marked increase in extension tone pattern in RLE during session (during all transitions, even sit<>stand) today, likely due to increased fatigue and less mobility over the weekend.  Ended session with forward/backwards gait x 5' with B blue rocker AFOs and B  shoe covers to improve clearance.  Pt requires facilitation for improved forward translation over RLE during stance and also improved L lateral weight shift when advancing RLE.  Note improvement in L knee flex and hip extension during backwards gait as compared to previous sessions.  At end of session once seated back in w/c, had pt reach (assisted) LUE over to R bar for improved trunk flexibility and motion.  Pt tolerated well.                   PT Education - 08/29/17 1539    Education Details  calling [facility] to schedule appt here in clinic for brace referral    Person(s) Educated  Patient    Methods  Explanation    Comprehension  Verbalized understanding       PT Short Term Goals - 06/10/17 1305      PT SHORT TERM GOAL #1   Title  Perform sit to/from stand from wheelchair or mat with +1 mod assist.    Baseline  06/09/16: met today    Status  Achieved      PT SHORT TERM GOAL #2   Title  Pt will perform sit to stand from wheelchair to RW with max assist for pre-gait skill.    Baseline  06/09/17: met today    Status  Achieved      PT SHORT TERM GOAL #3   Title  Pt will transfer supine to sit with max assist.    Baseline  06/09/17: met today, needs increased time to assist    Status  Achieved      PT SHORT TERM GOAL #4   Title  Pt will sit with RUE support with min to mod assist unsupported on side of mat for at least 3" to demo improved trunk strength and core stabilization.    Baseline  06/09/17: met today     Status  Achieved      PT SHORT TERM GOAL #5   Title  Pt will amb. 15' with appropriate assistive device with max assist +1    Baseline  06/09/17: pt now needs mod/max assist of 2 people for gait using modified 3 muskateer assist and blue rocker AFO to right LE    Status  Partially Met        PT Long Term Goals - 08/12/17 1332      PT LONG TERM GOAL #6   Title  Pt will be able to return to participation in therapeutic horseback riding at [facility] for  ongoing exercise program. (NEW MAINTAINANCE GOALS:  Reassess 8/13 and 9/10)      Baseline  Not  returned as of 07-12-17 - pt has had bil. hip surgeries    Time  4    Period  Weeks    Status  On-going      PT LONG TERM GOAL #7   Title  Pt will maintain ability to transfer (stand pivot) from varying seated surfaces at max A level to decrease burden of care to single caregiver.     Baseline  mod to max A (variable) on 08/12/17    Time  4    Period  Weeks    Status  On-going      PT LONG TERM GOAL #8   Title  Pt will maintain sitting balance with RUE support at min A level x 5 minutes in order to maintain postural and trunk control without external support of w/c in order to indicate improved postural alignment.      Baseline  Pt able to sit at S to min A level with RUE support x 5 mins, requires increased support if RUE not able to support on mat    Time  4    Period  Weeks    Status  On-going      PT LONG TERM GOAL  #9   TITLE  Pt will maintain ability to ambulate with +2 max A for 20' in order to faciliate weight bearing to maintain bone density, decrease spasticity, and decrease the progression of contractures.     Baseline  Pt able to ambulate x 25' with +2 max A 08/12/17    Time  4    Period  Weeks    Status  On-going      PT LONG TERM GOAL  #10   TITLE  Pt will maintain ability to stand with RUE support x 5 mins at min A level for LE stretching, for improved bowel, bladder and respiratory function.      Baseline  would like to assess this goal in // bars as she has solid UE support in this location and can simulate at home with stedy     Time  4    Period  Weeks    Status  On-going            Plan - 08/29/17 1542    Clinical Impression Statement  Pt extremely fatigued during session today and therefore had difficult time getting pt to attend to task.  Worked on standing, weight shifts, trunk elongation on L side and improved weight shift onto LLE.      Rehab Potential  Fair     Clinical Impairments Affecting Rehab Potential  severity of deficits with previous extensive therapies    PT Frequency  1x / week    PT Duration  12 weeks    PT Treatment/Interventions  ADLs/Self Care Home Management;Therapeutic exercise;Therapeutic activities;Functional mobility training;Gait training;Balance training;Neuromuscular re-education;Patient/family education;Orthotic Fit/Training;Passive range of motion    PT Next Visit Plan  cont LE stretching, gait training , static standing; use blue rocker AFO's     PT Home Exercise Plan  caregiver and husband have previously been instructed in ROM, sitting balance, and transfer training    Consulted and Agree with Plan of Care  Patient       Patient will benefit from skilled therapeutic intervention in order to improve the following deficits and impairments:  Abnormal gait, Decreased activity tolerance, Decreased balance, Decreased cognition, Decreased coordination, Decreased mobility, Decreased range of motion, Decreased endurance, Hypomobility, Decreased strength, Impaired flexibility, Increased muscle spasms, Impaired UE functional  use, Postural dysfunction, Pain  Visit Diagnosis: Abnormal posture  Other symptoms and signs involving the nervous system  Muscle weakness (generalized)  Unsteadiness on feet  Other abnormalities of gait and mobility  Stiffness of left hip, not elsewhere classified  Stiffness of right hip, not elsewhere classified     Problem List Patient Active Problem List   Diagnosis Date Noted  . Coagulase negative Staphylococcus bacteremia 02/09/2017  . Abdominal distension   . Chronic mucus hypersecretion, respiratory 12/31/2016  . Severe protein-calorie malnutrition [name]: less than 60% of standard weight) ([place]) 12/31/2016  . Acute urinary retention 12/31/2016  . Constipation 12/31/2016  . Shortness of breath   . Respiratory distress   . Pneumonia 12/05/2016  . Acute respiratory failure ([place])  12/05/2016  . Pseudomonas urinary tract infection 10/27/2016  . Severe muscle deconditioning 09/30/2016  . Pressure injury of skin 09/28/2016  . Respiratory failure ([place])   . Tracheobronchitis   . Urinary retention   . PEG (percutaneous endoscopic gastrostomy) status ([place])   . Benign essential HTN   . Hyponatremia   . Hypokalemia   . Reactive hypertension   . Respiratory insufficiency 09/24/2016  . Malignant neoplasm of upper-inner quadrant of left breast in female, estrogen receptor positive ([place]) 07/14/2016  . Heterotopic ossification of bone 03/10/2016  . Acute on chronic respiratory failure with hypoxia ([place])   . Acute hypoxemic respiratory failure ([place])   . Acute blood loss anemia   . Atelectasis 02/04/2016  . Debility 02/03/2016  . Pneumonia of both lower lobes due to Pseudomonas species ([place])   . Pneumonia of both lower lobes due to methicillin susceptible Staphylococcus aureus (MSSA) ([place])   . Acute respiratory failure with hypoxia ([place])   . Acute encephalopathy   . Seizures ([place])   . Sepsis ([place])   . Hypoxia   . Pain   . Chronic respiratory failure ([place]) 12/28/2015  . Acute tracheobronchitis 12/24/2015  . Tracheostomy tube present ([place])   . Tracheal stenosis   . Chronic respiratory failure with hypoxia ([place])   . Spastic tetraplegia ([place]) 10/20/2015  . Tracheostomy status ([place]) 09/26/2015  . Allergic rhinitis 09/26/2015  . 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 Movement disorder 09/22/2015  . Encephalitis 09/22/2015  . Chest pain 02/07/2014  . Premature atrial contractions 01/13/2014  . Abnormality of gait 12/04/2013  . Hyperlipidemia 12/21/2011  . Cardiovascular risk factor 12/21/2011  . Bunion 01/31/2008  . Metatarsalgia of both feet 09/20/2007  . FLAT FOOT 09/20/2007  . HALLUX RIGIDUS, ACQUIRED 09/20/2007    Cameron Sprang, PT, MPT [facility] [address] [place], [place], [address] Phone:  [phone]   Fax:  [phone] 08/29/17, 3:50 PM  Name: Zakyra [place] MRN: [id number] Date of Birth: 14-Jan-1951

## 2017-08-31 ENCOUNTER — Ambulatory Visit: Payer: Medicare Other

## 2017-08-31 DIAGNOSIS — R471 Dysarthria and anarthria: Secondary | ICD-10-CM

## 2017-08-31 DIAGNOSIS — R1312 Dysphagia, oropharyngeal phase: Secondary | ICD-10-CM

## 2017-08-31 DIAGNOSIS — R4701 Aphasia: Secondary | ICD-10-CM

## 2017-08-31 DIAGNOSIS — R41841 Cognitive communication deficit: Secondary | ICD-10-CM

## 2017-09-01 ENCOUNTER — Encounter: Payer: Self-pay | Admitting: Occupational Therapy

## 2017-09-01 ENCOUNTER — Ambulatory Visit: Payer: Medicare Other | Admitting: Occupational Therapy

## 2017-09-01 ENCOUNTER — Ambulatory Visit: Payer: Medicare Other | Admitting: Physical Therapy

## 2017-09-01 DIAGNOSIS — G8250 Quadriplegia, unspecified: Secondary | ICD-10-CM

## 2017-09-01 DIAGNOSIS — M6281 Muscle weakness (generalized): Secondary | ICD-10-CM

## 2017-09-01 DIAGNOSIS — M25611 Stiffness of right shoulder, not elsewhere classified: Secondary | ICD-10-CM

## 2017-09-01 DIAGNOSIS — M25612 Stiffness of left shoulder, not elsewhere classified: Secondary | ICD-10-CM

## 2017-09-01 DIAGNOSIS — R2689 Other abnormalities of gait and mobility: Secondary | ICD-10-CM

## 2017-09-01 DIAGNOSIS — R29818 Other symptoms and signs involving the nervous system: Secondary | ICD-10-CM

## 2017-09-01 DIAGNOSIS — R471 Dysarthria and anarthria: Secondary | ICD-10-CM | POA: Diagnosis not present

## 2017-09-01 DIAGNOSIS — M25622 Stiffness of left elbow, not elsewhere classified: Secondary | ICD-10-CM

## 2017-09-01 DIAGNOSIS — R293 Abnormal posture: Secondary | ICD-10-CM

## 2017-09-01 DIAGNOSIS — R2681 Unsteadiness on feet: Secondary | ICD-10-CM

## 2017-09-01 DIAGNOSIS — R41844 Frontal lobe and executive function deficit: Secondary | ICD-10-CM

## 2017-09-01 NOTE — Therapy (Signed)
[facility] [address] [place], [place], [address] Phone: [phone]   Fax:  [phone]  Occupational Therapy Treatment  Patient Details  Name: Judy Spears MRN: [id number] Date of Birth: 1950-11-20 Referring Provider: Dr. Levada Schilling   Encounter Date: 09/01/2017  OT End of Session - 09/01/17 1214    Visit Number  15    Number of Visits  24    Date for OT Re-Evaluation  09/28/17    Authorization Type  medicare maintenance program - pt to be reassessed every 30 days per Medicare guidelines  Will need PN every 10th visit    Authorization Time Period  90 days - 09/28/2017    OT Start Time  1018    OT Stop Time  1100    OT Time Calculation (min)  42 min    Activity Tolerance  Patient tolerated treatment well       Past Medical History:  Diagnosis Date  . Arthritis    lt great toe  . Complication of anesthesia    pt has had headaches post op-did advise to hydra well preop  . Hair loss    right-sided  . History of exercise stress test    a. ETT (12/15) with 12:00 exercise, no ST changes, occasional PACs.   . Hyperlipidemia   . Insomnia   . Migraine headache   . Movement disorder    resltess in left legs  . Postmenopausal   . 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 Premature atrial contractions    a. Holter (12/15) with 8% PACs, no atrial runs or atrial fibrillation.     Past Surgical History:  Procedure Laterality Date  . BREAST BIOPSY  01/01/2011   Procedure: BREAST BIOPSY WITH NEEDLE LOCALIZATION;  Surgeon: Rolm Bookbinder, MD;  Location: [facility];  Service: General;  Laterality: Left;  left breast wire localization biopsy  . BREAST BIOPSY Right 07/27/2012  . BREAST BIOPSY Right 07/07/2016  . BREAST BIOPSY Left 06/30/2016  . cataracts right eye    . CESAREAN SECTION  1986  . HERNIA REPAIR  2000   RIH  . INTRAMEDULLARY (IM) NAIL INTERTROCHANTERIC Left 01/30/2017   Procedure: ORIF  AFFIXUS NAIL;  Surgeon: Paralee Cancel, MD;  Location: [facility];  Service: Orthopedics;  Laterality: Left;  . IR REPLC GASTRO/COLONIC TUBE PERCUT W/FLUORO  09/29/2016  . opirectinal membrane peel    . PEG PLACEMENT    . RHINOPLASTY  1983  . TRACHEOSTOMY    . TRACHEOSTOMY TUBE PLACEMENT N/A 12/15/2016   Procedure: TRACHEOSTOMY CHANGE;  Surgeon: [name] Montane, MD;  Location: [facility];  Service: ENT;  Laterality: N/A;    There were no vitals filed for this visit.  Subjective Assessment - 09/01/17 1021    Subjective   I like that (in reference to therapy activities today)    Patient is accompained by:  Family member    Pertinent History  see epic snapshot - ABI/encephalitis/hypoxia from virus; recent hospitalization due to MRSA PNA     Patient Stated Goals  Pt unable to state.     Currently in Pain?  No/denies                   OT Treatments/Exercises (OP) - 09/01/17 0001      Neurological Re-education Exercises   Other Exercises 1  Neuro re ed in supine on wedge using smaller beach ball to faciltate lower trunk initated movement, decreased spasticity/tightness, address LE ROM (controlled  flexion/extension patterns), and isolated reciprocal LE movement.  Demonstrated to caregiver and husband and caregiver then practiced. After instruction and practice, caregiver able to return demonstrate.  Pt with improved phonation at end of session. Pt much more alert today and participatory.              OT Education - 09/01/17 1205    Education provided  Yes    Education Details  use of beach ball in supine for neuro re ed.     Person(s) Educated  Patient;Caregiver(s);Spouse    Methods  Explanation;Demonstration    Comprehension  Verbalized understanding;Returned demonstration          OT Long Term Goals - 09/01/17 1205      OT LONG TERM GOAL #1   Title  Pt will maintain ability to roll in bed during self care tasks with no more than max a x1 - reasssess 09/29/2017    Baseline   09/01/2017  pt able to maintain rolling with max a x1     Status  On-going      OT LONG TERM GOAL #2   Title  Pt will maintain the ability to use roll in shower chair with care giver assistance     Baseline  09/01/2017  pt continues to demonsrate adequate trunk control, sititng balance and decreased tone to use shower chair with care giver assistance.    Status  On-going      OT LONG TERM GOAL #3   Title  Pt will maintain ability to transfer to toilet with no more than max a x1 using Steady Assist - 10/06/2017    Baseline  09/01/2017 pt transfers with steady assist to toilet with max a x1      OT LONG TERM GOAL #4   Title  Pt wil maintain ability for dressing at max a x1.     Baseline  09/01/2017  pt able to be assisted with dressing by 1 person only at max a level    Status  On-going      OT LONG TERM GOAL #5   Title  Pt will maintain ability to stand with max a x1 to for clothing mgmt with toileting.     Baseline  09/01/2017  pt able to stand with max a x 1 during toileting tasks    Status  On-going      OT LONG TERM GOAL #6   Title  Pt will maintain ability to participate in functional transfers, as well as stand to sit and sit to stand with no more than max a x1     Status  On-going 09/01/2017  Pt able to transfer and complete sit to stand with max a     OT LONG TERM GOAL #7   Title  Pt will maintain functional mobility and trunk control to aid in her ability to clear sucretions and assist with phonation to express basic needs/wants    Status  On-going 09/01/2017  Pt has remained free of respiratory issues and is intermittently demonstrating improve phonation           Plan - 09/01/17 1206    Clinical Impression Statement  Pt much more alert today.  Pt has maintained all LTGs - see goal section. Pt has remained free of respiratory issues.     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    Occupational performance deficits (Please refer to evaluation for details):  ADL's;IADL's;Rest and Sleep;Leisure;Social Participation;Work    Neurosurgeon    Current Impairments/barriers affecting progress:  severity of multi system deficits, intermittent medical issues    OT Frequency  2x / week    OT Duration  12 weeks    OT Treatment/Interventions  Self-care/ADL training;Ultrasound;Moist Heat;Electrical Stimulation;DME and/or AE instruction;Neuromuscular education;Therapeutic exercise;Functional Mobility Training;Manual Therapy;Passive range of motion;Splinting;Therapeutic activities;Balance training;Patient/family education;Visual/perceptual remediation/compensation;Cognitive remediation/compensation    Plan  address rigidity and tone in trunk and extremeties, NMR trunk and LE's for transitional movements, static standing, sitting balance, sit to stand, stand to sit, stepping;     Consulted and Agree with Plan of Care  Patient;Family member/caregiver    Family Member Consulted  caregiver [name], husband [name]       Patient will benefit from skilled therapeutic intervention in order to improve the following deficits and impairments:  Decreased endurance, Decreased skin integrity, Impaired vision/preception, Improper body mechanics, Impaired perceived functional ability, Decreased knowledge of precautions, Cardiopulmonary status limiting activity, Decreased activity tolerance, Decreased knowledge of use of DME, Decreased strength, Impaired flexibility, Improper spinal/pelvic alignment, Impaired sensation, Difficulty walking, Decreased mobility, Decreased balance, Decreased cognition, Decreased range of motion, Impaired tone, Impaired UE functional use, Decreased safety awareness, Decreased coordination, Pain, Abnormal gait  Visit Diagnosis: Abnormal posture  Other symptoms  and signs involving the nervous system  Muscle weakness (generalized)  Unsteadiness on feet  Other abnormalities of gait and mobility  Frontal lobe and executive function deficit  Stiffness of right shoulder, not elsewhere classified  Stiffness of left shoulder, not elsewhere classified  Stiffness of left elbow, not elsewhere classified  Spastic tetraplegia [facility])    Problem List Patient Active Problem List   Diagnosis Date Noted  . Coagulase negative Staphylococcus bacteremia 02/09/2017  . Abdominal distension   . Chronic mucus hypersecretion, respiratory 12/31/2016  . Severe protein-calorie malnutrition [name]: less than 60% of standard weight) ([place]) 12/31/2016  . Acute urinary retention 12/31/2016  . Constipation 12/31/2016  . Shortness of breath   . Respiratory distress   . Pneumonia 12/05/2016  . Acute respiratory failure ([place]) 12/05/2016  . Pseudomonas urinary tract infection 10/27/2016  . Severe muscle deconditioning 09/30/2016  . Pressure injury of skin 09/28/2016  . Respiratory failure ([facility])   . Tracheobronchitis   . Urinary retention   . PEG (percutaneous endoscopic gastrostomy) status ([place])   . Benign essential HTN   . Hyponatremia   . Hypokalemia   . Reactive hypertension   . Respiratory insufficiency 09/24/2016  . Malignant neoplasm of upper-inner quadrant of left breast in female, estrogen receptor positive ([place]) 07/14/2016  . Heterotopic ossification of bone 03/10/2016  . Acute on chronic respiratory failure with hypoxia ([place])   . Acute hypoxemic respiratory failure ([place])   . Acute blood loss anemia   . Atelectasis 02/04/2016  . Debility 02/03/2016  . Pneumonia of both lower lobes due to Pseudomonas species ([place])   . Pneumonia of both lower lobes due to methicillin susceptible Staphylococcus aureus (MSSA) ([place])   . Acute respiratory failure with hypoxia ([place])   . Acute encephalopathy   . Seizures ([place])   . Sepsis ([place])   . Hypoxia   . Pain   .  Chronic respiratory failure ([place]) 12/28/2015  . Acute tracheobronchitis 12/24/2015  . Tracheostomy tube present ([place])   . Tracheal stenosis   . Chronic respiratory failure with hypoxia ([place])   .  Spastic tetraplegia ([place]) 10/20/2015  . Tracheostomy status ([place]) 09/26/2015  . Allergic rhinitis 09/26/2015  . 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 Movement disorder 09/22/2015  . Encephalitis 09/22/2015  . Chest pain 02/07/2014  . Premature atrial contractions 01/13/2014  . Abnormality of gait 12/04/2013  . Hyperlipidemia 12/21/2011  . Cardiovascular risk factor 12/21/2011  . Bunion 01/31/2008  . Metatarsalgia of both feet 09/20/2007  . FLAT FOOT 09/20/2007  . HALLUX RIGIDUS, ACQUIRED 09/20/2007    Quay Burow, OTR/L 09/01/2017, 12:15 PM  [facility] [address] [place], [place], [address] Phone: [phone]   Fax:  [phone]  Name: Judy Spears MRN: [id number] Date of Birth: 03-25-1950

## 2017-09-01 NOTE — Therapy (Signed)
[facility] [address] [place], [place], [address] Phone: [phone]   Fax:  [phone]  Speech Language Pathology Treatment  Patient Details  Name: Judy Spears MRN: [id number] Date of Birth: 04/06/1950 Referring Provider: Levada Schilling, MD   Encounter Date: 08/31/2017  End of Session - 08/31/17 1741    Visit Number  21    Number of Visits  24    Date for SLP Re-Evaluation  11/29/17   90 days   SLP Start Time  1450    SLP Stop Time   1533    SLP Time Calculation (min)  43 min    Activity Tolerance  Patient tolerated treatment well       Past Medical History:  Diagnosis Date  . Arthritis    lt great toe  . Complication of anesthesia    pt has had headaches post op-did advise to hydra well preop  . Hair loss    right-sided  . History of exercise stress test    a. ETT (12/15) with 12:00 exercise, no ST changes, occasional PACs.   . Hyperlipidemia   . Insomnia   . Migraine headache   . Movement disorder    resltess in left legs  . Postmenopausal   . 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 Premature atrial contractions    a. Holter (12/15) with 8% PACs, no atrial runs or atrial fibrillation.     Past Surgical History:  Procedure Laterality Date  . BREAST BIOPSY  01/01/2011   Procedure: BREAST BIOPSY WITH NEEDLE LOCALIZATION;  Surgeon: Rolm Bookbinder, MD;  Location: [facility];  Service: General;  Laterality: Left;  left breast wire localization biopsy  . BREAST BIOPSY Right 07/27/2012  . BREAST BIOPSY Right 07/07/2016  . BREAST BIOPSY Left 06/30/2016  . cataracts right eye    . CESAREAN SECTION  1986  . HERNIA REPAIR  2000   RIH  . INTRAMEDULLARY (IM) NAIL INTERTROCHANTERIC Left 01/30/2017   Procedure: ORIF AFFIXUS NAIL;  Surgeon: Paralee Cancel, MD;  Location: [facility];  Service: Orthopedics;  Laterality: Left;  . IR REPLC GASTRO/COLONIC TUBE PERCUT W/FLUORO  09/29/2016  . opirectinal  membrane peel    . PEG PLACEMENT    . RHINOPLASTY  1983  . TRACHEOSTOMY    . TRACHEOSTOMY TUBE PLACEMENT N/A 12/15/2016   Procedure: TRACHEOSTOMY CHANGE;  Surgeon: [name] Montane, MD;  Location: [facility];  Service: ENT;  Laterality: N/A;    There were no vitals filed for this visit.  Subjective Assessment - 08/31/17 1722    Subjective  Pt enters with pharyngeal/laryngeal congestion today.    Patient is accompained by:  Family member   [name] husband, [name] (aide)   Currently in Pain?  No/denies            ADULT SLP TREATMENT - 08/31/17 1722      General Information   Behavior/Cognition  Cooperative;Distractible;Pleasant mood      Treatment Provided   Treatment provided  Cognitive-Linquistic      Cognitive-Linquistic Treatment   Treatment focused on  Dysarthria;Patient/family/caregiver education    Skilled Treatment  Pt arrived today with pharyngeal/laryngeal congestion which persisted throughout therapy session. Pt's husband attempted to clear notable congestion by providing sips water x4 - once with pt immediate cough and once with delayed cough (approx 7 seconds post bolus). SLP has mentioned many times to husband and caregivers present as well to perform thorough oral care prior to providing pt H2O. Pt  remained with hydrophonic voice throughout the session despite the water sips (hydrophonic voice returned no greater than 20 seconds after liquid boluses). Spontaneous one word at a time production by pt approx 30% of the time, and this was initiated 50% of the time if pt was asked to repeat. However, due to reduced breath support this one word at a time articulation was only successful at increasing intelligibility 60% of the time. For aide [name]) this appeared to improve intelligibliity to near 75-80%. SLP cont'd to coach [name] and husband [name] on good and appropriate cueing for pt to attempt to clear hydrophonic voice, as well as for improved articulation by direct commenting on wording of  their cues. SLP again engaged pt in specific targeting of bilabials in initial and final positions and alveolars in initial positions today, leading to approx 60% success in her imitation of overarticulation. Pt aide also was observed to engage pt using this same technique today.       Assessment / Recommendations / Plan   Plan  Clinical Impressions: Pt has only met 2/6 long term goals. She continues to present as a severely medically and neurologically complex patient. Caregiver/s and husband have been educated during pt's sessions - and have shown adequate follow through regarding: 1) cueing techniques for improved articulation and breath support, 2) pt's use of compensatory measures for dysarthria, and 3) what is hydrophonic voice, and what to initiate from pt in case of hydrophonic voice in order to eliminate/lessen severity. They have also received education about parameters to meet to ensure safe administration of water protocol.  Given pt's significantly complex impairments, gongoing medical issues and pt fluctuations in alertness/attention requiring advanced clinical reasoning to respond to pt's needs and adjust maintenance tasks for communication and for pulmonary health, SLP feels that pt is most appropriate at this time for maintenance therapy provided by a skilled speech language pathologist. Pt is deemed to be at significant risk for further development of comorbidities which can impact the cont'd care of skilled tx.      Progression Toward Goals   Progression toward goals  See below     Given the severe complex nature of pt's clinical presentation including: 1) Frequent often sudden medical decline including significant recurring respiratory issues with h/o PNA, risk for sepsis, severe osteoporosis with h/o multiple pathological fractures 2) Severe multisystem neurological impairment including   -significant cognitive impairments including fluctuating level of attention and arousal,  memory, basic processing of information, ability to consistently follow 1-2 step commands  -significant dysarthria including improper breath support, and articulator strength and ROM  -fluctuating level of expressive and receptive language ability, which is very closely tied to overall health and daily fluctuating fatigue levels  -significant visual impairments including directionality, postural awareness, body in space, body relative to environment, and depth perception  Pt requires the intervention of skilled therapeutic intervention due to the above clinical presentation as well as to manage a maintenance therapy program carried out mainly by pt's health aide in pt's home. The patient's status often fluctuates, requiring advanced clinicial reasoning to adjust treatment interventions based on pt response on a regular basis and   Pt at this time will be moved from rehabilitative therapy to maintenance therapy. Given the above issues pt is at very high risk for: 1) decline of current level of function in communication skills that would hinder or inhibit pt ability to verbalize pain and other medical symptoms crucial to her care and QOL 2) decline in pulmonary  health leading to development of pneumonia or other short or long-term respiratory complications  Pt to be seen approximately once every 3-5 weeks with re-assessment at 90 days. Rehab potential is thought to be good for maintenance therapy, at frequency once approx every 4 weeks x90 days.    SLP Short Term Goals - 06/27/17 1750      SLP SHORT TERM GOAL #1   Title  Pt will demonstrate dysarthria HEP to increase articulatory coordination for speech with usual mod A over 3 sessions    Status  Deferred      SLP SHORT TERM GOAL #2   Title  Pt will achieve phonation adequate for SLP intelligibility in imitated two-syllable words using intelligibility compensations 12/20 trials with usual min A in 3 sessions    Status  Not Met      SLP SHORT  TERM GOAL #3   Title  Pt will imitate 2-syllable words or phrases with functional intelligibility for SLP in 5/15 trials over 3 sessions     Status  Not Met      SLP SHORT TERM GOAL #4   Title  Pt will sustain attention to therapy tasks for 10 minutes with multimodal cues in 5 sessions    Status  Partially Met      SLP SHORT TERM GOAL #5   Title  pt family will demo understanding of oral care regimen, including safe ingestion of water/ice chips over three sessions    Status  Achieved      SLP SHORT TERM GOAL #6   Title  pt will demo dysphagia HEP to improve management of secretions and safety with any possible POs    Status  Deferred      SLP SHORT TERM GOAL #7   Title  family/caregivers will demo understanding of pt swallowing precautions, for pt safety with POs, by verbal report     Status  Achieved       SLP Long Term Goals - 08/31/17 1743      SLP LONG TERM GOAL #1   Title  Pt will demo dysarthria  HEP to incr articulatory coordination for speech, with occasional mod A over three sessions    Status  Not Met      SLP LONG TERM GOAL #2   Title  pt will achieve phonation adequate for SLP intelligibility in imitated 3-syllable words/3-word phrases using intelligibility compenastions 15/20 trials    Status  Not Met   and ongoing     SLP LONG TERM GOAL #3   Title  Pt will imitate 2-syllable words or phrases with functional intelligibility for SLP in 8/15 trials over three sessions    Status  Partially Met   1/3 sessions; goal will not continue     SLP LONG TERM GOAL #4   Title  pt will sustain attention to therapy tasks for 10 minutes with multimodal cues in 10 sessions    Status  Partially Met   7 sessions; goal will not continue     SLP LONG TERM GOAL #5   Title  pt will demo dysphagia HEP to improve management of secretions and safety with possible POs    Status  Deferred      Additional Long Term Goals   Additional Long Term Goals  Yes      SLP LONG TERM GOAL #6    Title  pt's family will demo appropriate cueing in each session for assisting pt with her deficits in speech/language/swallowing  Baseline  08-17-17, 08-26-17, 08-31-17    Time  2    Period  Weeks    Status  Partially Met   3 of 4 weeks; goal to continue as revised above     SLP LONG TERM GOAL #7   Title  pt's family, in order to demonstrate knowledge of necessities for pt's optimal pulmonary health, during each session will tell SLP the safety parameters with water protocol    Time  --   each    Period  --   session   Status  New         Patient will benefit from skilled therapeutic intervention in order to improve the following deficits and impairments:   Dysarthria and anarthria - Plan: SLP plan of care cert/re-cert  Cognitive communication deficit - Plan: SLP plan of care cert/re-cert  Dysphagia, oropharyngeal phase - Plan: SLP plan of care cert/re-cert  Aphasia - Plan: SLP plan of care cert/re-cert    Problem List Patient Active Problem List   Diagnosis Date Noted  . Coagulase negative Staphylococcus bacteremia 02/09/2017  . Abdominal distension   . Chronic mucus hypersecretion, respiratory 12/31/2016  . Severe protein-calorie malnutrition [name]: less than 60% of standard weight) ([place]) 12/31/2016  . Acute urinary retention 12/31/2016  . Constipation 12/31/2016  . Shortness of breath   . Respiratory distress   . Pneumonia 12/05/2016  . Acute respiratory failure ([place]) 12/05/2016  . Pseudomonas urinary tract infection 10/27/2016  . Severe muscle deconditioning 09/30/2016  . Pressure injury of skin 09/28/2016  . Respiratory failure ([place])   . Tracheobronchitis   . Urinary retention   . PEG (percutaneous endoscopic gastrostomy) status ([place])   . Benign essential HTN   . Hyponatremia   . Hypokalemia   . Reactive hypertension   . Respiratory insufficiency 09/24/2016  . Malignant neoplasm of upper-inner quadrant of left breast in female, estrogen receptor positive  ([place]) 07/14/2016  . Heterotopic ossification of bone 03/10/2016  . Acute on chronic respiratory failure with hypoxia ([place])   . Acute hypoxemic respiratory failure ([place])   . Acute blood loss anemia   . Atelectasis 02/04/2016  . Debility 02/03/2016  . Pneumonia of both lower lobes due to Pseudomonas species ([place])   . Pneumonia of both lower lobes due to methicillin susceptible Staphylococcus aureus (MSSA) ([place])   . Acute respiratory failure with hypoxia ([place])   . Acute encephalopathy   . Seizures ([place])   . Sepsis ([place])   . Hypoxia   . Pain   . Chronic respiratory failure ([place]) 12/28/2015  . Acute tracheobronchitis 12/24/2015  . Tracheostomy tube present ([place])   . Tracheal stenosis   . Chronic respiratory failure with hypoxia ([place])   . Spastic tetraplegia ([place]) 10/20/2015  . Tracheostomy status ([place]) 09/26/2015  . Allergic rhinitis 09/26/2015  . 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 Movement disorder 09/22/2015  . Encephalitis 09/22/2015  . Chest pain 02/07/2014  . Premature atrial contractions 01/13/2014  . Abnormality of gait 12/04/2013  . Hyperlipidemia 12/21/2011  . Cardiovascular risk factor 12/21/2011  . Bunion 01/31/2008  . Metatarsalgia of both feet 09/20/2007  . FLAT FOOT 09/20/2007  . HALLUX RIGIDUS, ACQUIRED 09/20/2007    [facility] ,[place], CCC-SLP  09/01/2017, 2:22 PM  [facility] [name] [place], [place], [address] Phone: [phone]   Fax:  [phone]   Name: Judy Spears MRN: [id number] Date of Birth: 1950/12/01

## 2017-09-05 ENCOUNTER — Encounter: Payer: Self-pay | Admitting: Occupational Therapy

## 2017-09-05 ENCOUNTER — Ambulatory Visit: Payer: Medicare Other | Admitting: Occupational Therapy

## 2017-09-05 DIAGNOSIS — R2681 Unsteadiness on feet: Secondary | ICD-10-CM

## 2017-09-05 DIAGNOSIS — M25612 Stiffness of left shoulder, not elsewhere classified: Secondary | ICD-10-CM

## 2017-09-05 DIAGNOSIS — R293 Abnormal posture: Secondary | ICD-10-CM

## 2017-09-05 DIAGNOSIS — R41844 Frontal lobe and executive function deficit: Secondary | ICD-10-CM

## 2017-09-05 DIAGNOSIS — M6281 Muscle weakness (generalized): Secondary | ICD-10-CM

## 2017-09-05 DIAGNOSIS — R471 Dysarthria and anarthria: Secondary | ICD-10-CM | POA: Diagnosis not present

## 2017-09-05 DIAGNOSIS — R29818 Other symptoms and signs involving the nervous system: Secondary | ICD-10-CM

## 2017-09-05 DIAGNOSIS — G8250 Quadriplegia, unspecified: Secondary | ICD-10-CM

## 2017-09-05 DIAGNOSIS — M25611 Stiffness of right shoulder, not elsewhere classified: Secondary | ICD-10-CM

## 2017-09-05 DIAGNOSIS — M25622 Stiffness of left elbow, not elsewhere classified: Secondary | ICD-10-CM

## 2017-09-05 NOTE — Therapy (Signed)
[facility] [address] [place], [place], [address] Phone: [phone]   Fax:  [phone]  Occupational Therapy Treatment  Patient Details  Name: Judy Spears MRN: [id number] Date of Birth: [birth year] Referring Provider: Dr. Levada Schilling   Encounter Date: 09/05/2017  OT End of Session - 09/05/17 1600    Visit Number  16    Number of Visits  24    Authorization Type  medicare maintenance program - pt to be reassessed every 30 days per Medicare guidelines  Will need PN every 10th visit    Authorization Time Period  90 days - 09/28/2017    OT Start Time  1233    OT Stop Time  1314    OT Time Calculation (min)  41 min    Activity Tolerance  Patient limited by fatigue       Past Medical History:  Diagnosis Date  . Arthritis    lt great toe  . Complication of anesthesia    pt has had headaches post op-did advise to hydra well preop  . Hair loss    right-sided  . History of exercise stress test    a. ETT (12/15) with 12:00 exercise, no ST changes, occasional PACs.   . Hyperlipidemia   . Insomnia   . Migraine headache   . Movement disorder    resltess in left legs  . Postmenopausal   . 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 Premature atrial contractions    a. Holter (12/15) with 8% PACs, no atrial runs or atrial fibrillation.     Past Surgical History:  Procedure Laterality Date  . BREAST BIOPSY  01/01/2011   Procedure: BREAST BIOPSY WITH NEEDLE LOCALIZATION;  Surgeon: Rolm Bookbinder, MD;  Location: [facility];  Service: General;  Laterality: Left;  left breast wire localization biopsy  . BREAST BIOPSY Right 07/27/2012  . BREAST BIOPSY Right 07/07/2016  . BREAST BIOPSY Left 06/30/2016  . cataracts right eye    . CESAREAN SECTION  1986  . HERNIA REPAIR  2000   RIH  . INTRAMEDULLARY (IM) NAIL INTERTROCHANTERIC Left 01/30/2017   Procedure: ORIF AFFIXUS NAIL;  Surgeon: Paralee Cancel, MD;   Location: [facility];  Service: Orthopedics;  Laterality: Left;  . IR REPLC GASTRO/COLONIC TUBE PERCUT W/FLUORO  09/29/2016  . opirectinal membrane peel    . PEG PLACEMENT    . RHINOPLASTY  1983  . TRACHEOSTOMY    . TRACHEOSTOMY TUBE PLACEMENT N/A 12/15/2016   Procedure: TRACHEOSTOMY CHANGE;  Surgeon: [name] Montane, MD;  Location: [facility];  Service: ENT;  Laterality: N/A;    There were no vitals filed for this visit.  Subjective Assessment - 09/05/17 1236    Subjective   I feel yucky    Patient is accompained by:  Family member   caregiver [name]   Pertinent History  see epic snapshot - ABI/encephalitis/hypoxia from virus; recent hospitalization due to MRSA PNA     Patient Stated Goals  Pt unable to state.     Currently in Pain?  No/denies                   OT Treatments/Exercises (OP) - 09/05/17 0001      Neurological Re-education Exercises   Other Exercises 1  Neuro re ed in parellel bars to address forward weight shift, sit to stand, postural alignment and functional ambulation in bars.  Pt very lethargic today and with questioning able to state she was very  tired and not feeling well today. O2 sats 92.  Caregiver to take temp once home as pt had runny nose today and has been around young children this weekend.  Pt required max a x1, mod a x1 for stepping in parallel bars.  Pt required max cues today as well. Transitioned into sitting on mat to address postural alignment in sitting, finding midline, forward translation of weight and sit to squat. Pt able to initiate several repetitions of sit to squat from elevated surface with repetition and then incorporate into transfer back to wheelchair with max a for pivot into chair.                OT Short Term Goals - 07/14/17 1433      OT SHORT TERM GOAL #1   Title  --    Status  --      OT SHORT TERM GOAL #2   Title  --    Status  --      OT SHORT TERM GOAL #6   Title  --      OT SHORT TERM GOAL #7   Title  --         OT Long Term Goals - 09/05/17 1559      OT LONG TERM GOAL #1   Title  Pt will maintain ability to roll in bed during self care tasks with no more than max a x1 - reasssess 09/29/2017    Baseline  09/01/2017  pt able to maintain rolling with max a x1     Status  On-going      OT LONG TERM GOAL #2   Title  Pt will maintain the ability to use roll in shower chair with care giver assistance     Baseline  09/01/2017  pt continues to demonsrate adequate trunk control, sititng balance and decreased tone to use shower chair with care giver assistance.    Status  On-going      OT LONG TERM GOAL #3   Title  Pt will maintain ability to transfer to toilet with no more than max a x1 using Steady Assist - 10/06/2017    Baseline  09/01/2017 pt transfers with steady assist to toilet with max a x1      OT LONG TERM GOAL #4   Title  Pt wil maintain ability for dressing at max a x1.     Baseline  09/01/2017  pt able to be assisted with dressing by 1 person only at max a level    Status  On-going      OT LONG TERM GOAL #5   Title  Pt will maintain ability to stand with max a x1 to for clothing mgmt with toileting.     Baseline  09/01/2017  pt able to stand with max a x 1 during toileting tasks    Status  On-going      OT LONG TERM GOAL #6   Title  Pt will maintain ability to participate in functional transfers, as well as stand to sit and sit to stand with no more than max a x1    Status  On-going   8/82019  pt able ot transfer and complete sit to stand with max a x1     OT LONG TERM GOAL #7   Title  Pt will maintain functional mobility and trunk control to aid in her ability to clear sucretions and assist with phonation to express basic needs/wants   09/01/2017  pt has  remained free of respiratory issues and intermittently improved phonation   Status  On-going            Plan - 09/05/17 1559    Clinical Impression Statement  Pt intermittently lethargic today and reported that she wasn't feeling  well - caregiver states she awoke at 3 am and did not go back to sleep. Caregiver to take temp at home as pt had runny nose and was around small children this weekend.     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    Occupational performance deficits (Please refer to evaluation for details):  ADL's;IADL's;Rest and Sleep;Leisure;Social Participation;Work    Neurosurgeon    Current Impairments/barriers affecting progress:  severity of multi system deficits, intermittent medical issues    OT Frequency  2x / week    OT Duration  12 weeks    OT Treatment/Interventions  Self-care/ADL training;Ultrasound;Moist Heat;Electrical Stimulation;DME and/or AE instruction;Neuromuscular education;Therapeutic exercise;Functional Mobility Training;Manual Therapy;Passive range of motion;Splinting;Therapeutic activities;Balance training;Patient/family education;Visual/perceptual remediation/compensation;Cognitive remediation/compensation    Plan  address rigidity and tone in trunk and extremeties, NMR trunk and LE's for transitional movements, static standing, sitting balance, sit to stand, stand to sit, stepping;     Consulted and Agree with Plan of Care  Patient;Family member/caregiver    Family Member Consulted  caregiver [name]       Patient will benefit from skilled therapeutic intervention in order to improve the following deficits and impairments:  Decreased endurance, Decreased skin integrity, Impaired vision/preception, Improper body mechanics, Impaired perceived functional ability, Decreased knowledge of precautions, Cardiopulmonary status limiting activity, Decreased activity tolerance, Decreased knowledge of use of DME, Decreased strength, Impaired flexibility, Improper spinal/pelvic  alignment, Impaired sensation, Difficulty walking, Decreased mobility, Decreased balance, Decreased cognition, Decreased range of motion, Impaired tone, Impaired UE functional use, Decreased safety awareness, Decreased coordination, Pain, Abnormal gait  Visit Diagnosis: Abnormal posture  Other symptoms and signs involving the nervous system  Muscle weakness (generalized)  Unsteadiness on feet  Frontal lobe and executive function deficit  Stiffness of right shoulder, not elsewhere classified  Stiffness of left shoulder, not elsewhere classified  Stiffness of left elbow, not elsewhere classified  Spastic tetraplegia (HCC)    Problem List Patient Active Problem List   Diagnosis Date Noted  . Coagulase negative Staphylococcus bacteremia 02/09/2017  . Abdominal distension   . Chronic mucus hypersecretion, respiratory 12/31/2016  . Severe protein-calorie malnutrition [name]: less than 60% of standard weight) ([facility]) 12/31/2016  . Acute urinary retention 12/31/2016  . Constipation 12/31/2016  . Shortness of breath   . Respiratory distress   . Pneumonia 12/05/2016  . Acute respiratory failure ([facility]) 12/05/2016  . Pseudomonas urinary tract infection 10/27/2016  . Severe muscle deconditioning 09/30/2016  . Pressure injury of skin 09/28/2016  . Respiratory failure ([facility])   . Tracheobronchitis   . Urinary retention   . PEG (percutaneous endoscopic gastrostomy) status ([facility])   . Benign essential HTN   . Hyponatremia   . Hypokalemia   . Reactive hypertension   . Respiratory insufficiency 09/24/2016  . Malignant neoplasm of upper-inner quadrant of left breast in female, estrogen receptor positive ([facility]) 07/14/2016  . Heterotopic ossification of bone 03/10/2016  . Acute on chronic respiratory failure with hypoxia ([facility])   . Acute hypoxemic respiratory failure ([facility])   . Acute blood loss  anemia   . Atelectasis 02/04/2016  . Debility 02/03/2016  . Pneumonia of both lower lobes due to  Pseudomonas species ([facility])   . Pneumonia of both lower lobes due to methicillin susceptible Staphylococcus aureus (MSSA) ([facility])   . Acute respiratory failure with hypoxia ([facility])   . Acute encephalopathy   . Seizures ([facility])   . Sepsis ([facility])   . Hypoxia   . Pain   . Chronic respiratory failure ([facility]) 12/28/2015  . Acute tracheobronchitis 12/24/2015  . Tracheostomy tube present ([facility])   . Tracheal stenosis   . Chronic respiratory failure with hypoxia ([facility])   . Spastic tetraplegia ([facility]) 10/20/2015  . Tracheostomy status ([facility]) 09/26/2015  . Allergic rhinitis 09/26/2015  . 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 Movement disorder 09/22/2015  . Encephalitis 09/22/2015  . Chest pain 02/07/2014  . Premature atrial contractions 01/13/2014  . Abnormality of gait 12/04/2013  . Hyperlipidemia 12/21/2011  . Cardiovascular risk factor 12/21/2011  . Bunion 01/31/2008  . Metatarsalgia of both feet 09/20/2007  . FLAT FOOT 09/20/2007  . HALLUX RIGIDUS, ACQUIRED 09/20/2007    Quay Burow, OTR/L 09/05/2017, 4:02 PM  [facility] [address] [place], [place], [address] Phone: [phone]   Fax:  [phone]  Name: Judy Spears MRN: [id number] Date of Birth: 09/08/1950

## 2017-09-06 ENCOUNTER — Ambulatory Visit: Payer: Medicare Other | Admitting: Physical Therapy

## 2017-09-06 ENCOUNTER — Encounter: Payer: Self-pay | Admitting: Occupational Therapy

## 2017-09-06 ENCOUNTER — Ambulatory Visit: Payer: Medicare Other | Admitting: Occupational Therapy

## 2017-09-06 ENCOUNTER — Encounter: Payer: Medicare Other | Admitting: Speech Pathology

## 2017-09-06 DIAGNOSIS — R2689 Other abnormalities of gait and mobility: Secondary | ICD-10-CM

## 2017-09-06 DIAGNOSIS — M25622 Stiffness of left elbow, not elsewhere classified: Secondary | ICD-10-CM

## 2017-09-06 DIAGNOSIS — M25611 Stiffness of right shoulder, not elsewhere classified: Secondary | ICD-10-CM

## 2017-09-06 DIAGNOSIS — R293 Abnormal posture: Secondary | ICD-10-CM

## 2017-09-06 DIAGNOSIS — G8250 Quadriplegia, unspecified: Secondary | ICD-10-CM

## 2017-09-06 DIAGNOSIS — R41844 Frontal lobe and executive function deficit: Secondary | ICD-10-CM

## 2017-09-06 DIAGNOSIS — R471 Dysarthria and anarthria: Secondary | ICD-10-CM | POA: Diagnosis not present

## 2017-09-06 DIAGNOSIS — M25612 Stiffness of left shoulder, not elsewhere classified: Secondary | ICD-10-CM

## 2017-09-06 DIAGNOSIS — R2681 Unsteadiness on feet: Secondary | ICD-10-CM

## 2017-09-06 DIAGNOSIS — R29818 Other symptoms and signs involving the nervous system: Secondary | ICD-10-CM

## 2017-09-06 DIAGNOSIS — M6281 Muscle weakness (generalized): Secondary | ICD-10-CM

## 2017-09-06 NOTE — Therapy (Signed)
[facility] [address] [place], [place], [address] Phone: [phone]   Fax:  [phone]  Occupational Therapy Treatment  Patient Details  Name: Judy Spears MRN: [id number] Date of Birth: 08/05/1950 Referring Provider: Dr. Levada Schilling   Encounter Date: 09/06/2017  OT End of Session - 09/06/17 1214    Visit Number  17    Number of Visits  24    Date for OT Re-Evaluation  09/28/17    Authorization Type  medicare maintenance program - pt to be reassessed every 30 days per Medicare guidelines  Will need PN every 10th visit    Authorization Time Period  90 days - 09/28/2017    OT Start Time  1018    OT Stop Time  1100    OT Time Calculation (min)  42 min    Activity Tolerance  Patient tolerated treatment well       Past Medical History:  Diagnosis Date  . Arthritis    lt great toe  . Complication of anesthesia    pt has had headaches post op-did advise to hydra well preop  . Hair loss    right-sided  . History of exercise stress test    a. ETT (12/15) with 12:00 exercise, no ST changes, occasional PACs.   . Hyperlipidemia   . Insomnia   . Migraine headache   . Movement disorder    resltess in left legs  . Postmenopausal   . 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 Premature atrial contractions    a. Holter (12/15) with 8% PACs, no atrial runs or atrial fibrillation.     Past Surgical History:  Procedure Laterality Date  . BREAST BIOPSY  01/01/2011   Procedure: BREAST BIOPSY WITH NEEDLE LOCALIZATION;  Surgeon: Rolm Bookbinder, MD;  Location: [facility];  Service: General;  Laterality: Left;  left breast wire localization biopsy  . BREAST BIOPSY Right 07/27/2012  . BREAST BIOPSY Right 07/07/2016  . BREAST BIOPSY Left 06/30/2016  . cataracts right eye    . CESAREAN SECTION  1986  . HERNIA REPAIR  2000   RIH  . INTRAMEDULLARY (IM) NAIL INTERTROCHANTERIC Left 01/30/2017   Procedure: ORIF  AFFIXUS NAIL;  Surgeon: Paralee Cancel, MD;  Location: [facility];  Service: Orthopedics;  Laterality: Left;  . IR REPLC GASTRO/COLONIC TUBE PERCUT W/FLUORO  09/29/2016  . opirectinal membrane peel    . PEG PLACEMENT    . RHINOPLASTY  1983  . TRACHEOSTOMY    . TRACHEOSTOMY TUBE PLACEMENT N/A 12/15/2016   Procedure: TRACHEOSTOMY CHANGE;  Surgeon: [name] Montane, MD;  Location: [facility];  Service: ENT;  Laterality: N/A;    There were no vitals filed for this visit.  Subjective Assessment - 09/06/17 1206    Subjective   I feel better today    Patient is accompained by:  Family member   husband [name]   Pertinent History  see epic snapshot - ABI/encephalitis/hypoxia from virus; recent hospitalization due to MRSA PNA     Patient Stated Goals  Pt unable to state.     Currently in Pain?  No/denies                   OT Treatments/Exercises (OP) - 09/06/17 0001      ADLs   Functional Mobility  Demonstrated and instructed pt, caregiver [name] and husband [name] in alternative transfer method that allows pt to be more active in transfers as well as facilitates midline and  foward translation of weight in sitting, working in patterns of flexion, discouraging extensor tone.. Transfer method from rolling chair with caregiver seated in front of pt, cues for pt to bring nose to caregiver's left shoulder and cues to caregiver to wait after counting to three to allow pt to lift up into squat while caregiver faciltates staying forward, then assisting with pivot. Pt able to lift with only min a, pivot with min a to left, mod assist to R.  After practice and repetition, both caregiver [name] and husband [name] were able to return demonstrate. Also instructed caregiver [name] facilitation strategies from sitting pt's R side to work toward sit to squat, as well as sit to stand, lateral weight shifts once standing and active stand to sit. Caregiver able to return demonstrate after practice and cues.              OT  Education - 09/06/17 1212    Education provided  Yes    Education Details  alternative transfer strategy, sit to squat, sit to stand, stand to sit from pt's right side.    Person(s) Educated  Patient;Spouse;Caregiver(s)    Methods  Explanation;Demonstration;Verbal cues    Comprehension  Verbalized understanding;Returned demonstration       OT Short Term Goals - 07/14/17 1433      OT SHORT TERM GOAL #1   Title  --    Status  --      OT SHORT TERM GOAL #2   Title  --    Status  --      OT SHORT TERM GOAL #6   Title  --      OT SHORT TERM GOAL #7   Title  --        OT Long Term Goals - 09/06/17 1212      OT LONG TERM GOAL #1   Title  Pt will maintain ability to roll in bed during self care tasks with no more than max a x1 - reasssess 09/29/2017    Baseline  09/01/2017  pt able to maintain rolling with max a x1     Status  On-going      OT LONG TERM GOAL #2   Title  Pt will maintain the ability to use roll in shower chair with care giver assistance     Baseline  09/01/2017  pt continues to demonsrate adequate trunk control, sititng balance and decreased tone to use shower chair with care giver assistance.    Status  On-going      OT LONG TERM GOAL #3   Title  Pt will maintain ability to transfer to toilet with no more than max a x1 using Steady Assist - 10/06/2017    Baseline  09/01/2017 pt transfers with steady assist to toilet with max a x1      OT LONG TERM GOAL #4   Title  Pt wil maintain ability for dressing at max a x1.     Baseline  09/01/2017  pt able to be assisted with dressing by 1 person only at max a level    Status  On-going      OT LONG TERM GOAL #5   Title  Pt will maintain ability to stand with max a x1 to for clothing mgmt with toileting.     Baseline  09/01/2017  pt able to stand with max a x 1 during toileting tasks    Status  On-going      OT LONG TERM GOAL #6  Title  Pt will maintain ability to participate in functional transfers, as well as stand to sit  and sit to stand with no more than max a x1    Status  On-going   8/82019  pt able ot transfer and complete sit to stand with max a x1     OT LONG TERM GOAL #7   Title  Pt will maintain functional mobility and trunk control to aid in her ability to clear sucretions and assist with phonation to express basic needs/wants   09/01/2017  pt has remained free of respiratory issues and intermittently improved phonation   Status  On-going            Plan - 09/06/17 1213    Clinical Impression Statement  Pt feeling better today and much more participatory than last session. Pt able to actively engage in all mobility activities and require less assistance today.     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    Occupational performance deficits (Please refer to evaluation for details):  ADL's;IADL's;Rest and Sleep;Leisure;Social Participation;Work    Neurosurgeon    Current Impairments/barriers affecting progress:  severity of multi system deficits, intermittent medical issues    OT Frequency  2x / week    OT Duration  12 weeks    OT Treatment/Interventions  Self-care/ADL training;Ultrasound;Moist Heat;Electrical Stimulation;DME and/or AE instruction;Neuromuscular education;Therapeutic exercise;Functional Mobility Training;Manual Therapy;Passive range of motion;Splinting;Therapeutic activities;Balance training;Patient/family education;Visual/perceptual remediation/compensation;Cognitive remediation/compensation    Plan  address rigidity and tone in trunk and extremeties, NMR trunk and LE's for transitional movements, static standing, sitting balance, sit to stand, stand to sit, stepping;     Consulted and Agree with Plan of Care  Patient;Family member/caregiver    Family  Member Consulted  caregiver [name], husband [name]       Patient will benefit from skilled therapeutic intervention in order to improve the following deficits and impairments:  Decreased endurance, Decreased skin integrity, Impaired vision/preception, Improper body mechanics, Impaired perceived functional ability, Decreased knowledge of precautions, Cardiopulmonary status limiting activity, Decreased activity tolerance, Decreased knowledge of use of DME, Decreased strength, Impaired flexibility, Improper spinal/pelvic alignment, Impaired sensation, Difficulty walking, Decreased mobility, Decreased balance, Decreased cognition, Decreased range of motion, Impaired tone, Impaired UE functional use, Decreased safety awareness, Decreased coordination, Pain, Abnormal gait  Visit Diagnosis: Abnormal posture  Other symptoms and signs involving the nervous system  Muscle weakness (generalized)  Frontal lobe and executive function deficit  Unsteadiness on feet  Stiffness of right shoulder, not elsewhere classified  Stiffness of left shoulder, not elsewhere classified  Stiffness of left elbow, not elsewhere classified  Spastic tetraplegia (HCC)  Other abnormalities of gait and mobility    Problem List Patient Active Problem List   Diagnosis Date Noted  . Coagulase negative Staphylococcus bacteremia 02/09/2017  . Abdominal distension   . Chronic mucus hypersecretion, respiratory 12/31/2016  . Severe protein-calorie malnutrition [name]: less than 60% of standard weight) ([place]) 12/31/2016  . Acute urinary retention 12/31/2016  . Constipation 12/31/2016  . Shortness of breath   . Respiratory distress   . Pneumonia 12/05/2016  . Acute respiratory failure ([facility]) 12/05/2016  . Pseudomonas urinary tract infection 10/27/2016  . Severe muscle deconditioning 09/30/2016  . Pressure injury of skin 09/28/2016  . Respiratory failure ([facility])   . Tracheobronchitis   .  Urinary retention   . PEG  (percutaneous endoscopic gastrostomy) status ([facility])   . Benign essential HTN   . Hyponatremia   . Hypokalemia   . Reactive hypertension   . Respiratory insufficiency 09/24/2016  . Malignant neoplasm of upper-inner quadrant of left breast in female, estrogen receptor positive ([facility]) 07/14/2016  . Heterotopic ossification of bone 03/10/2016  . Acute on chronic respiratory failure with hypoxia ([facility])   . Acute hypoxemic respiratory failure ([facility])   . Acute blood loss anemia   . Atelectasis 02/04/2016  . Debility 02/03/2016  . Pneumonia of both lower lobes due to Pseudomonas species ([facility])   . Pneumonia of both lower lobes due to methicillin susceptible Staphylococcus aureus (MSSA) ([facility])   . Acute respiratory failure with hypoxia ([facility])   . Acute encephalopathy   . Seizures ([facility])   . Sepsis ([place])   . Hypoxia   . Pain   . Chronic respiratory failure ([facility]) 12/28/2015  . Acute tracheobronchitis 12/24/2015  . Tracheostomy tube present ([place])   . Tracheal stenosis   . Chronic respiratory failure with hypoxia ([place])   . Spastic tetraplegia ([facility]) 10/20/2015  . Tracheostomy status ([place]) 09/26/2015  . Allergic rhinitis 09/26/2015  . 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 Movement disorder 09/22/2015  . Encephalitis 09/22/2015  . Chest pain 02/07/2014  . Premature atrial contractions 01/13/2014  . Abnormality of gait 12/04/2013  . Hyperlipidemia 12/21/2011  . Cardiovascular risk factor 12/21/2011  . Bunion 01/31/2008  . Metatarsalgia of both feet 09/20/2007  . FLAT FOOT 09/20/2007  . HALLUX RIGIDUS, ACQUIRED 09/20/2007    Quay Burow, OTR/L 09/06/2017, 12:15 PM  [facility] [address] [place], [place], [address] Phone: [phone]   Fax:  [phone]  Name: Laurinda Carreno MRN: [id number] Date of Birth: 1950-08-28

## 2017-09-08 ENCOUNTER — Encounter: Payer: Self-pay | Admitting: Rehabilitation

## 2017-09-08 ENCOUNTER — Encounter: Payer: Medicare Other | Admitting: Occupational Therapy

## 2017-09-08 ENCOUNTER — Ambulatory Visit: Payer: Medicare Other | Admitting: Rehabilitation

## 2017-09-08 DIAGNOSIS — M6281 Muscle weakness (generalized): Secondary | ICD-10-CM

## 2017-09-08 DIAGNOSIS — R2681 Unsteadiness on feet: Secondary | ICD-10-CM

## 2017-09-08 DIAGNOSIS — R293 Abnormal posture: Secondary | ICD-10-CM

## 2017-09-08 DIAGNOSIS — R471 Dysarthria and anarthria: Secondary | ICD-10-CM | POA: Diagnosis not present

## 2017-09-08 DIAGNOSIS — M25652 Stiffness of left hip, not elsewhere classified: Secondary | ICD-10-CM

## 2017-09-08 DIAGNOSIS — R2689 Other abnormalities of gait and mobility: Secondary | ICD-10-CM

## 2017-09-08 DIAGNOSIS — M25651 Stiffness of right hip, not elsewhere classified: Secondary | ICD-10-CM

## 2017-09-08 DIAGNOSIS — R29818 Other symptoms and signs involving the nervous system: Secondary | ICD-10-CM

## 2017-09-08 NOTE — Therapy (Signed)
[facility] [address] [place], [place], [address] Phone: [phone]   Fax:  [phone]  Physical Therapy Treatment  Patient Details  Name: Judy Spears MRN: [id number] Date of Birth: 03-06-1950 Referring Provider: Dr. Levada Schilling   Encounter Date: 09/08/2017  PT End of Session - 09/08/17 1246    Visit Number  23    Number of Visits  28    Date for PT Re-Evaluation  10/04/17    Authorization Type  [employer] Medicare    Authorization Time Period  07/12/17-10/04/17    PT Start Time  1019    PT Stop Time  1105    PT Time Calculation (min)  46 min    Activity Tolerance  Patient tolerated treatment well    Behavior During Therapy  [facility] for tasks assessed/performed       Past Medical History:  Diagnosis Date  . Arthritis    lt great toe  . Complication of anesthesia    pt has had headaches post op-did advise to hydra well preop  . Hair loss    right-sided  . History of exercise stress test    a. ETT (12/15) with 12:00 exercise, no ST changes, occasional PACs.   . Hyperlipidemia   . Insomnia   . Migraine headache   . Movement disorder    resltess in left legs  . Postmenopausal   . 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 Premature atrial contractions    a. Holter (12/15) with 8% PACs, no atrial runs or atrial fibrillation.     Past Surgical History:  Procedure Laterality Date  . BREAST BIOPSY  01/01/2011   Procedure: BREAST BIOPSY WITH NEEDLE LOCALIZATION;  Surgeon: Rolm Bookbinder, MD;  Location: [facility];  Service: General;  Laterality: Left;  left breast wire localization biopsy  . BREAST BIOPSY Right 07/27/2012  . BREAST BIOPSY Right 07/07/2016  . BREAST BIOPSY Left 06/30/2016  . cataracts right eye    . CESAREAN SECTION  1986  . HERNIA REPAIR  2000   RIH  . INTRAMEDULLARY (IM) NAIL INTERTROCHANTERIC Left 01/30/2017   Procedure: ORIF AFFIXUS NAIL;  Surgeon: Paralee Cancel, MD;  Location:  [facility];  Service: Orthopedics;  Laterality: Left;  . IR REPLC GASTRO/COLONIC TUBE PERCUT W/FLUORO  09/29/2016  . opirectinal membrane peel    . PEG PLACEMENT    . RHINOPLASTY  1983  . TRACHEOSTOMY    . TRACHEOSTOMY TUBE PLACEMENT N/A 12/15/2016   Procedure: TRACHEOSTOMY CHANGE;  Surgeon: [name] Montane, MD;  Location: [facility];  Service: ENT;  Laterality: N/A;    There were no vitals filed for this visit.  Subjective Assessment - 09/08/17 1242    Subjective  Caregiver reports pt had late night, but did sleep well.     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  No/denies           Ortho fit/train:  Chris from [facility] present during session to assist with assessment of appropriate bracing to improve functional transfers, standing and gait.  Had pt ambulate x 15' without braces/shoe covers with +2A with heavy facilitation needed B for weight shift, upright posture, improved R lateral trunk flexion, L lateral weight shift, and forward advancement of each LE during swing.  Donned B blue rocker AFOs with B shoe covers to  simulate toe cap and ambulated another 3' with +2A in same manner as above, but with less assist needed to advance LEs.  Note with increased distance, she did fatigue and needed increased assist for trunk to maintain upright posture and midline along with assisting to advance LLE and assist with forward/lateral weight shift during stance.  Gerald Stabs also wanted to trial B spry step PLS AFOs, therefore did try this during session and note that from an assist standpoint, pt requires less assist for more upright posture and also less assist for lateral weight shift and foot clearance.  Again, with fatigue, note she needs increased assist.  Would like to go ahead with brace order, but will continue to use these in future sessions to continue to assess.  Again, pt somewhat  fatigued going into session, therefore would like to see pt when she is more alert and try these braces at beginning of session.  Pt and family verbalized understanding.                      PT Education - 09/08/17 1242    Education Details  rationale for ordering B AFOs    Person(s) Educated  Patient;Spouse;Caregiver(s)    Methods  Explanation;Demonstration    Comprehension  Verbalized understanding       PT Short Term Goals - 06/10/17 1305      PT SHORT TERM GOAL #1   Title  Perform sit to/from stand from wheelchair or mat with +1 mod assist.    Baseline  06/09/16: met today    Status  Achieved      PT SHORT TERM GOAL #2   Title  Pt will perform sit to stand from wheelchair to RW with max assist for pre-gait skill.    Baseline  06/09/17: met today    Status  Achieved      PT SHORT TERM GOAL #3   Title  Pt will transfer supine to sit with max assist.    Baseline  06/09/17: met today, needs increased time to assist    Status  Achieved      PT SHORT TERM GOAL #4   Title  Pt will sit with RUE support with min to mod assist unsupported on side of mat for at least 3" to demo improved trunk strength and core stabilization.    Baseline  06/09/17: met today     Status  Achieved      PT SHORT TERM GOAL #5   Title  Pt will amb. 15' with appropriate assistive device with max assist +1    Baseline  06/09/17: pt now needs mod/max assist of 2 people for gait using modified 3 muskateer assist and blue rocker AFO to right LE    Status  Partially Met        PT Long Term Goals - 08/12/17 1332      PT LONG TERM GOAL #6   Title  Pt will be able to return to participation in therapeutic horseback riding at [facility] for ongoing exercise program. (NEW MAINTAINANCE GOALS:  Reassess 8/13 and 9/10)      Baseline  Not returned as of 07-12-17 - pt has had bil. hip surgeries    Time  4    Period  Weeks    Status  On-going      PT LONG TERM GOAL #7   Title  Pt will maintain ability  to transfer (stand pivot) from varying seated surfaces at max A level  to decrease burden of care to single caregiver.     Baseline  mod to max A (variable) on 08/12/17    Time  4    Period  Weeks    Status  On-going      PT LONG TERM GOAL #8   Title  Pt will maintain sitting balance with RUE support at min A level x 5 minutes in order to maintain postural and trunk control without external support of w/c in order to indicate improved postural alignment.      Baseline  Pt able to sit at S to min A level with RUE support x 5 mins, requires increased support if RUE not able to support on mat    Time  4    Period  Weeks    Status  On-going      PT LONG TERM GOAL  #9   TITLE  Pt will maintain ability to ambulate with +2 max A for 20' in order to faciliate weight bearing to maintain bone density, decrease spasticity, and decrease the progression of contractures.     Baseline  Pt able to ambulate x 25' with +2 max A 08/12/17    Time  4    Period  Weeks    Status  On-going      PT LONG TERM GOAL  #10   TITLE  Pt will maintain ability to stand with RUE support x 5 mins at min A level for LE stretching, for improved bowel, bladder and respiratory function.      Baseline  would like to assess this goal in // bars as she has solid UE support in this location and can simulate at home with stedy     Time  4    Period  Weeks    Status  On-going            Plan - 09/08/17 1248    Clinical Impression Statement  Skilled session with Gerald Stabs from [facility] in order to assess for appropriate bracing needs to imrpove functional transfers, standing and short distance gait.  Feel that pt did very well with B spry step PLS AFOs.  We had not used these in clinic before, therefore would like to work more with them, but [facility] plans to go ahead and order and modify as needed as pt has increased tone allowing heel to come up in shoe often.  Pt, spouse and caregiver verbalized understanding.     Clinical Impairments  Affecting Rehab Potential  severity of deficits with previous extensive therapies    PT Frequency  1x / week    PT Duration  12 weeks    PT Treatment/Interventions  ADLs/Self Care Home Management;Therapeutic exercise;Therapeutic activities;Functional mobility training;Gait training;Balance training;Neuromuscular re-education;Patient/family education;Orthotic Fit/Training;Passive range of motion    PT Next Visit Plan  comment on LTGs, gait with B spry step PLS AFOs and shoe covers!    Consulted and Agree with Plan of Care  Patient;Family member/caregiver       Patient will benefit from skilled therapeutic intervention in order to improve the following deficits and impairments:  Abnormal gait, Decreased activity tolerance, Decreased balance, Decreased cognition, Decreased coordination, Decreased mobility, Decreased range of motion, Decreased endurance, Hypomobility, Decreased strength, Impaired flexibility, Increased muscle spasms, Impaired UE functional use, Postural dysfunction, Pain  Visit Diagnosis: Abnormal posture  Other symptoms and signs involving the nervous system  Muscle weakness (generalized)  Unsteadiness on feet  Other abnormalities of gait and mobility  Stiffness of left  hip, not elsewhere classified  Stiffness of right hip, not elsewhere classified     Problem List Patient Active Problem List   Diagnosis Date Noted  . Coagulase negative Staphylococcus bacteremia 02/09/2017  . Abdominal distension   . Chronic mucus hypersecretion, respiratory 12/31/2016  . Severe protein-calorie malnutrition [name]: less than 60% of standard weight) ([place]) 12/31/2016  . Acute urinary retention 12/31/2016  . Constipation 12/31/2016  . Shortness of breath   . Respiratory distress   . Pneumonia 12/05/2016  . Acute respiratory failure ([place]) 12/05/2016  . Pseudomonas urinary tract infection 10/27/2016  . Severe muscle deconditioning 09/30/2016  . Pressure injury of skin 09/28/2016   . Respiratory failure ([place])   . Tracheobronchitis   . Urinary retention   . PEG (percutaneous endoscopic gastrostomy) status ([place])   . Benign essential HTN   . Hyponatremia   . Hypokalemia   . Reactive hypertension   . Respiratory insufficiency 09/24/2016  . Malignant neoplasm of upper-inner quadrant of left breast in female, estrogen receptor positive ([place]) 07/14/2016  . Heterotopic ossification of bone 03/10/2016  . Acute on chronic respiratory failure with hypoxia ([facility])   . Acute hypoxemic respiratory failure ([place])   . Acute blood loss anemia   . Atelectasis 02/04/2016  . Debility 02/03/2016  . Pneumonia of both lower lobes due to Pseudomonas species ([place])   . Pneumonia of both lower lobes due to methicillin susceptible Staphylococcus aureus (MSSA) ([place])   . Acute respiratory failure with hypoxia ([place])   . Acute encephalopathy   . Seizures ([place])   . Sepsis ([place])   . Hypoxia   . Pain   . Chronic respiratory failure ([place]) 12/28/2015  . Acute tracheobronchitis 12/24/2015  . Tracheostomy tube present ([place])   . Tracheal stenosis   . Chronic respiratory failure with hypoxia ([place])   . Spastic tetraplegia ([place]) 10/20/2015  . Tracheostomy status ([place]) 09/26/2015  . Allergic rhinitis 09/26/2015  . 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 Movement disorder 09/22/2015  . Encephalitis 09/22/2015  . Chest pain 02/07/2014  . Premature atrial contractions 01/13/2014  . Abnormality of gait 12/04/2013  . Hyperlipidemia 12/21/2011  . Cardiovascular risk factor 12/21/2011  . Bunion 01/31/2008  . Metatarsalgia of both feet 09/20/2007  . FLAT FOOT 09/20/2007  . HALLUX RIGIDUS, ACQUIRED 09/20/2007    Cameron Sprang, PT, MPT [facility] [address] [place], [place], [address] Phone: [phone]   Fax:  [phone] 09/08/17, 1:06 PM  Name: Judy Spears MRN: [id number] Date of Birth: 03/31/1950

## 2017-09-13 ENCOUNTER — Encounter: Payer: Medicare Other | Admitting: Occupational Therapy

## 2017-09-13 ENCOUNTER — Ambulatory Visit: Payer: Medicare Other | Admitting: Physical Therapy

## 2017-09-15 ENCOUNTER — Encounter: Payer: Medicare Other | Admitting: Occupational Therapy

## 2017-09-15 ENCOUNTER — Ambulatory Visit: Payer: Medicare Other | Admitting: Physical Therapy

## 2017-09-20 ENCOUNTER — Encounter: Payer: Medicare Other | Admitting: Occupational Therapy

## 2017-09-20 ENCOUNTER — Ambulatory Visit: Payer: Medicare Other | Admitting: Occupational Therapy

## 2017-09-20 ENCOUNTER — Ambulatory Visit: Payer: Medicare Other | Admitting: Physical Therapy

## 2017-09-20 ENCOUNTER — Encounter: Payer: Self-pay | Admitting: Occupational Therapy

## 2017-09-20 DIAGNOSIS — M25611 Stiffness of right shoulder, not elsewhere classified: Secondary | ICD-10-CM

## 2017-09-20 DIAGNOSIS — R293 Abnormal posture: Secondary | ICD-10-CM

## 2017-09-20 DIAGNOSIS — R471 Dysarthria and anarthria: Secondary | ICD-10-CM | POA: Diagnosis not present

## 2017-09-20 DIAGNOSIS — M25612 Stiffness of left shoulder, not elsewhere classified: Secondary | ICD-10-CM

## 2017-09-20 DIAGNOSIS — R2689 Other abnormalities of gait and mobility: Secondary | ICD-10-CM

## 2017-09-20 DIAGNOSIS — M6281 Muscle weakness (generalized): Secondary | ICD-10-CM

## 2017-09-20 DIAGNOSIS — R2681 Unsteadiness on feet: Secondary | ICD-10-CM

## 2017-09-20 DIAGNOSIS — R41844 Frontal lobe and executive function deficit: Secondary | ICD-10-CM

## 2017-09-20 DIAGNOSIS — M25622 Stiffness of left elbow, not elsewhere classified: Secondary | ICD-10-CM

## 2017-09-20 DIAGNOSIS — R29818 Other symptoms and signs involving the nervous system: Secondary | ICD-10-CM

## 2017-09-20 NOTE — Therapy (Signed)
[facility] [address] [place], [place], [address] Phone: [phone]   Fax:  [phone]  Occupational Therapy Treatment  Patient Details  Name: Judy Spears MRN: [id number] Date of Birth: 1951-01-06 Referring Provider: Dr. Levada Schilling   Encounter Date: 09/20/2017  OT End of Session - 09/20/17 1557    Visit Number  18    Number of Visits  24    Date for OT Re-Evaluation  09/28/17    Authorization Type  medicare maintenance program - pt to be reassessed every 30 days per Medicare guidelines  Will need PN every 10th visit    Authorization Time Period  90 days - 09/28/2017    Authorization - Visit Number  18    Authorization - Number of Visits  20    OT Start Time  1401    OT Stop Time  1443    OT Time Calculation (min)  42 min    Activity Tolerance  Patient limited by pain       Past Medical History:  Diagnosis Date  . Arthritis    lt great toe  . Complication of anesthesia    pt has had headaches post op-did advise to hydra well preop  . Hair loss    right-sided  . History of exercise stress test    a. ETT (12/15) with 12:00 exercise, no ST changes, occasional PACs.   . Hyperlipidemia   . Insomnia   . Migraine headache   . Movement disorder    resltess in left legs  . Postmenopausal   . 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 Premature atrial contractions    a. Holter (12/15) with 8% PACs, no atrial runs or atrial fibrillation.     Past Surgical History:  Procedure Laterality Date  . BREAST BIOPSY  01/01/2011   Procedure: BREAST BIOPSY WITH NEEDLE LOCALIZATION;  Surgeon: Rolm Bookbinder, MD;  Location: [facility];  Service: General;  Laterality: Left;  left breast wire localization biopsy  . BREAST BIOPSY Right 07/27/2012  . BREAST BIOPSY Right 07/07/2016  . BREAST BIOPSY Left 06/30/2016  . cataracts right eye    . CESAREAN SECTION  1986  . HERNIA REPAIR  2000   RIH  .  INTRAMEDULLARY (IM) NAIL INTERTROCHANTERIC Left 01/30/2017   Procedure: ORIF AFFIXUS NAIL;  Surgeon: Paralee Cancel, MD;  Location: [facility];  Service: Orthopedics;  Laterality: Left;  . IR REPLC GASTRO/COLONIC TUBE PERCUT W/FLUORO  09/29/2016  . opirectinal membrane peel    . PEG PLACEMENT    . RHINOPLASTY  1983  . TRACHEOSTOMY    . TRACHEOSTOMY TUBE PLACEMENT N/A 12/15/2016   Procedure: TRACHEOSTOMY CHANGE;  Surgeon: [name] Montane, MD;  Location: [facility];  Service: ENT;  Laterality: N/A;    There were no vitals filed for this visit.  Subjective Assessment - 09/20/17 1409    Subjective   My right leg hurts    Patient is accompained by:  Family member   husband [name] and caregiver [name]   Pertinent History  see epic snapshot - ABI/encephalitis/hypoxia from virus; recent hospitalization due to MRSA PNA     Patient Stated Goals  Pt unable to state.     Currently in Pain?  Yes    Pain Score  8     Pain Location  Leg    Pain Orientation  Right    Pain Descriptors / Indicators  Sore    Pain Type  Acute pain  Pain Onset  In the past 7 days   stated it started a few days ago   Pain Frequency  Intermittent    Aggravating Factors   Pt able to state it isn't there all the time but she did feel it when she was standing and stepping with PT                   OT Treatments/Exercises (OP) - 09/20/17 0001      Neurological Re-education Exercises   Other Exercises 1  Pt reporting pain in R leg (indicating back of upper thigh).  Addressed squat pivot transfer to mat with emphasis on leaning foward and forward translation of weight onto feet -pt able to actively assist with sit to squat on first attempt today. (min a) then max a for pivot to mat.  Transitoned pt into supine on wedge. Pt grabbed R leg and pointed to back of R thigh however unable to find any tender areas with palpation.  Pt with signficantly increased tone today throughout. Utilized reflex positioning to discourage LE extension.  Worked in patterns of flexion /lower trunk initiated rotation with alternating trunk elongation/shortening to decrease tone overall and improve pt's ability to participate in isolated movement. Also addressed UE ROm and otne reduction techniques.  Transitioned pt into sitting to work on sitting balance, postural alignment and control and forward weight shift. At end of sesion pt able to maintain sitting balance with close supervision and mod cues while therapist donned shoes.  Pt needed max cues and three attempts before she was able to activate into sit to squat during squat pivot transfer.                OT Short Term Goals - 07/14/17 1433      OT SHORT TERM GOAL #1   Title  --    Status  --      OT SHORT TERM GOAL #2   Title  --    Status  --      OT SHORT TERM GOAL #6   Title  --      OT SHORT TERM GOAL #7   Title  --        OT Long Term Goals - 09/20/17 1555      OT LONG TERM GOAL #1   Title  Pt will maintain ability to roll in bed during self care tasks with no more than max a x1 - reasssess 10/06/2017 (date adjusted as pt missed one week of therapy due to being out of town for medical reasons)    Baseline  09/01/2017  pt able to maintain rolling with max a x1     Status  On-going      OT LONG TERM GOAL #2   Title  Pt will maintain the ability to use roll in shower chair with care giver assistance     Baseline  09/01/2017  pt continues to demonsrate adequate trunk control, sititng balance and decreased tone to use shower chair with care giver assistance.    Status  On-going      OT LONG TERM GOAL #3   Title  Pt will maintain ability to transfer to toilet with no more than max a x1 using Steady Assist - 10/06/2017    Baseline  09/01/2017 pt transfers with steady assist to toilet with max a x1      OT LONG TERM GOAL #4   Title  Pt wil maintain ability for dressing at  max a x1.     Baseline  09/01/2017  pt able to be assisted with dressing by 1 person only at max a level     Status  On-going      OT LONG TERM GOAL #5   Title  Pt will maintain ability to stand with max a x1 to for clothing mgmt with toileting.     Baseline  09/01/2017  pt able to stand with max a x 1 during toileting tasks    Status  On-going      OT LONG TERM GOAL #6   Title  Pt will maintain ability to participate in functional transfers, as well as stand to sit and sit to stand with no more than max a x1    Status  On-going   8/82019  pt able ot transfer and complete sit to stand with max a x1     OT LONG TERM GOAL #7   Title  Pt will maintain functional mobility and trunk control to aid in her ability to clear sucretions and assist with phonation to express basic needs/wants   09/01/2017  pt has remained free of respiratory issues and intermittently improved phonation   Status  On-going            Plan - 09/20/17 1556    Clinical Impression Statement  Pt able to phonate better today during conversation regarding RLE leg pain.  Pt actively participating in session today.     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    Occupational performance deficits (Please refer to evaluation for details):  ADL's;IADL's;Rest and Sleep;Leisure;Social Participation;Work    Neurosurgeon    Current Impairments/barriers affecting progress:  severity of multi system deficits, intermittent medical issues    OT Frequency  2x / week    OT Duration  12 weeks    OT Treatment/Interventions  Self-care/ADL training;Ultrasound;Moist Heat;Electrical Stimulation;DME and/or AE instruction;Neuromuscular education;Therapeutic exercise;Functional Mobility Training;Manual Therapy;Passive range of motion;Splinting;Therapeutic activities;Balance training;Patient/family education;Visual/perceptual  remediation/compensation;Cognitive remediation/compensation    Plan  address rigidity and tone in trunk and extremeties, NMR trunk and LE's for transitional movements, static standing, sitting balance, sit to stand, stand to sit, stepping;     Consulted and Agree with Plan of Care  Patient;Family member/caregiver    Family Member Consulted  caregiver [name], husband [name]       Patient will benefit from skilled therapeutic intervention in order to improve the following deficits and impairments:  Decreased endurance, Decreased skin integrity, Impaired vision/preception, Improper body mechanics, Impaired perceived functional ability, Decreased knowledge of precautions, Cardiopulmonary status limiting activity, Decreased activity tolerance, Decreased knowledge of use of DME, Decreased strength, Impaired flexibility, Improper spinal/pelvic alignment, Impaired sensation, Difficulty walking, Decreased mobility, Decreased balance, Decreased cognition, Decreased range of motion, Impaired tone, Impaired UE functional use, Decreased safety awareness, Decreased coordination, Pain, Abnormal gait  Visit Diagnosis: Abnormal posture  Other symptoms and signs involving the nervous system  Muscle weakness (generalized)  Unsteadiness on feet  Other abnormalities of gait and mobility  Frontal lobe and executive function deficit  Stiffness of right shoulder, not elsewhere classified  Stiffness of left shoulder, not elsewhere classified  Stiffness of left elbow, not elsewhere classified    Problem List Patient Active Problem List   Diagnosis Date Noted  . Coagulase negative Staphylococcus bacteremia 02/09/2017  .  Abdominal distension   . Chronic mucus hypersecretion, respiratory 12/31/2016  . Severe protein-calorie malnutrition [name]: less than 60% of standard weight) ([place]) 12/31/2016  . Acute urinary retention 12/31/2016  . Constipation 12/31/2016  . Shortness of breath   . Respiratory distress    . Pneumonia 12/05/2016  . Acute respiratory failure ([place]) 12/05/2016  . Pseudomonas urinary tract infection 10/27/2016  . Severe muscle deconditioning 09/30/2016  . Pressure injury of skin 09/28/2016  . Respiratory failure ([place])   . Tracheobronchitis   . Urinary retention   . PEG (percutaneous endoscopic gastrostomy) status ([place])   . Benign essential HTN   . Hyponatremia   . Hypokalemia   . Reactive hypertension   . Respiratory insufficiency 09/24/2016  . Malignant neoplasm of upper-inner quadrant of left breast in female, estrogen receptor positive ([place]) 07/14/2016  . Heterotopic ossification of bone 03/10/2016  . Acute on chronic respiratory failure with hypoxia ([place])   . Acute hypoxemic respiratory failure ([place])   . Acute blood loss anemia   . Atelectasis 02/04/2016  . Debility 02/03/2016  . Pneumonia of both lower lobes due to Pseudomonas species ([place])   . Pneumonia of both lower lobes due to methicillin susceptible Staphylococcus aureus (MSSA) ([place])   . Acute respiratory failure with hypoxia ([place])   . Acute encephalopathy   . Seizures ([place])   . Sepsis ([place])   . Hypoxia   . Pain   . Chronic respiratory failure ([place]) 12/28/2015  . Acute tracheobronchitis 12/24/2015  . Tracheostomy tube present ([place])   . Tracheal stenosis   . Chronic respiratory failure with hypoxia ([place])   . Spastic tetraplegia ([place]) 10/20/2015  . Tracheostomy status ([place]) 09/26/2015  . Allergic rhinitis 09/26/2015  . 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 Movement disorder 09/22/2015  . Encephalitis 09/22/2015  . Chest pain 02/07/2014  . Premature atrial contractions 01/13/2014  . Abnormality of gait 12/04/2013  . Hyperlipidemia 12/21/2011  . Cardiovascular risk factor 12/21/2011  . Bunion 01/31/2008  . Metatarsalgia of both feet 09/20/2007  . FLAT FOOT 09/20/2007  . HALLUX RIGIDUS, ACQUIRED 09/20/2007    Quay Burow, OTR/L 09/20/2017, 3:59 PM  [facility] [address] [place], [place], [address] Phone: [phone]   Fax:  [phone]  Name: Judy Spears MRN: [id number] Date of Birth: 1950/03/16

## 2017-09-21 ENCOUNTER — Encounter: Payer: Self-pay | Admitting: Physical Therapy

## 2017-09-21 NOTE — Therapy (Addendum)
[facility] [address] [place], [place], [address] Phone: [phone]   Fax:  [phone]  Physical Therapy Treatment  Patient Details  Name: Judy Spears MRN: [id number] Date of Birth: 12/14/1950 Referring Provider: Dr. Levada Schilling   Encounter Date: 09/20/2017    Past Medical History:  Diagnosis Date  . Arthritis    lt great toe  . Complication of anesthesia    pt has had headaches post op-did advise to hydra well preop  . Hair loss    right-sided  . History of exercise stress test    a. ETT (12/15) with 12:00 exercise, no ST changes, occasional PACs.   . Hyperlipidemia   . Insomnia   . Migraine headache   . Movement disorder    resltess in left legs  . Postmenopausal   . 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 Premature atrial contractions    a. Holter (12/15) with 8% PACs, no atrial runs or atrial fibrillation.     Past Surgical History:  Procedure Laterality Date  . BREAST BIOPSY  01/01/2011   Procedure: BREAST BIOPSY WITH NEEDLE LOCALIZATION;  Surgeon: Rolm Bookbinder, MD;  Location: [facility];  Service: General;  Laterality: Left;  left breast wire localization biopsy  . BREAST BIOPSY Right 07/27/2012  . BREAST BIOPSY Right 07/07/2016  . BREAST BIOPSY Left 06/30/2016  . cataracts right eye    . CESAREAN SECTION  1986  . HERNIA REPAIR  2000   RIH  . INTRAMEDULLARY (IM) NAIL INTERTROCHANTERIC Left 01/30/2017   Procedure: ORIF AFFIXUS NAIL;  Surgeon: Paralee Cancel, MD;  Location: [facility];  Service: Orthopedics;  Laterality: Left;  . IR REPLC GASTRO/COLONIC TUBE PERCUT W/FLUORO  09/29/2016  . opirectinal membrane peel    . PEG PLACEMENT    . RHINOPLASTY  1983  . TRACHEOSTOMY    . TRACHEOSTOMY TUBE PLACEMENT N/A 12/15/2016   Procedure: TRACHEOSTOMY CHANGE;  Surgeon: [name] Montane, MD;  Location: [facility];  Service: ENT;  Laterality: N/A;    There were no vitals filed for this  visit.                    [facility] Adult PT Treatment/Exercise - 09/23/17 0001      Transfers   Transfers  Sit to Stand;Stand to Sit    Sit to Stand  3: Mod assist    Sit to Stand Details  Tactile cues for sequencing;Tactile cues for posture;Tactile cues for weight shifting;Verbal cues for sequencing;Verbal cues for technique;Manual facilitation for weight shifting;Manual facilitation for placement;Manual facilitation for weight bearing    Stand to Sit  3: Mod assist    Stand to Sit Details (indicate cue type and reason)  Tactile cues for sequencing;Tactile cues for weight shifting;Tactile cues for posture;Tactile cues for placement;Verbal cues for sequencing;Verbal cues for technique;Manual facilitation for weight shifting;Manual facilitation for placement;Manual facilitation for weight bearing    Stand Pivot Transfers  3: Mod assist    Stand Pivot Transfer Details (indicate cue type and reason)  wheelchair to mat      Ambulation/Gait   Ambulation/Gait  Yes    Ambulation/Gait Assistance  1: +2 Total assist    Ambulation/Gait Assistance Details  Spry AFO's donned on each LE    Ambulation Distance (Feet)  12 Feet    Assistive device  2 person hand held assist    Gait Pattern  Step-to pattern;Step-through pattern;Decreased stride length;Decreased dorsiflexion - right;Decreased dorsiflexion - left;Decreased weight shift to  left;Ataxic;Trunk flexed;Narrow base of support;Poor foot clearance - left   Caregiver, [name], assisted pt in advancing LLE   Ambulation Surface  Level;Indoor      NeuroRe-ed:  Worked on sitting balance ([name] requested for pt not to lie supine as meds were recently given) on side of  Mat unsupported; sitting to Rt and Lt 1/2 side sitting; stretching contralateral side of trunk when in side sitting position Pt able to return to upright seated position from Rt 1/2 side sitting but needs max assist for Lt side sitting to upright   Worked on anterior weight shift  with pt leaning forward - pt's RUE on PT's left thigh - leaning forward and returning to upright with min to mod assist         PT Short Term Goals - 06/10/17 1305      PT SHORT TERM GOAL #1   Title  Perform sit to/from stand from wheelchair or mat with +1 mod assist.    Baseline  06/09/16: met today    Status  Achieved      PT SHORT TERM GOAL #2   Title  Pt will perform sit to stand from wheelchair to RW with max assist for pre-gait skill.    Baseline  06/09/17: met today    Status  Achieved      PT SHORT TERM GOAL #3   Title  Pt will transfer supine to sit with max assist.    Baseline  06/09/17: met today, needs increased time to assist    Status  Achieved      PT SHORT TERM GOAL #4   Title  Pt will sit with RUE support with min to mod assist unsupported on side of mat for at least 3" to demo improved trunk strength and core stabilization.    Baseline  06/09/17: met today     Status  Achieved      PT SHORT TERM GOAL #5   Title  Pt will amb. 15' with appropriate assistive device with max assist +1    Baseline  06/09/17: pt now needs mod/max assist of 2 people for gait using modified 3 muskateer assist and blue rocker AFO to right LE    Status  Partially Met        PT Long Term Goals - 08/12/17 1332      PT LONG TERM GOAL #6   Title  Pt will be able to return to participation in therapeutic horseback riding at [facility] for ongoing exercise program. (NEW MAINTAINANCE GOALS:  Reassess 8/13 and 9/10)      Baseline  Not returned as of 07-12-17 - pt has had bil. hip surgeries    Time  4    Period  Weeks    Status  On-going      PT LONG TERM GOAL #7   Title  Pt will maintain ability to transfer (stand pivot) from varying seated surfaces at max A level to decrease burden of care to single caregiver.     Baseline  mod to max A (variable) on 08/12/17    Time  4    Period  Weeks    Status  On-going      PT LONG TERM GOAL #8   Title  Pt will maintain sitting balance with RUE  support at min A level x 5 minutes in order to maintain postural and trunk control without external support of w/c in order to indicate improved postural alignment.  Baseline  Pt able to sit at S to min A level with RUE support x 5 mins, requires increased support if RUE not able to support on mat    Time  4    Period  Weeks    Status  On-going      PT LONG TERM GOAL  #9   TITLE  Pt will maintain ability to ambulate with +2 max A for 20' in order to faciliate weight bearing to maintain bone density, decrease spasticity, and decrease the progression of contractures.     Baseline  Pt able to ambulate x 25' with +2 max A 08/12/17    Time  4    Period  Weeks    Status  On-going      PT LONG TERM GOAL  #10   TITLE  Pt will maintain ability to stand with RUE support x 5 mins at min A level for LE stretching, for improved bowel, bladder and respiratory function.      Baseline  would like to assess this goal in // bars as she has solid UE support in this location and can simulate at home with stedy     Time  4    Period  Weeks    Status  On-going            Plan - 09/23/17 1455    Clinical Impression Statement  Pt presented with significant increased tone throughout body, UE's and LE's; pt had difficulty maintaining upright seated position while AFOs were being donned (pt seated on side of mat with caregiver [name] providing assist to maintain balance); pt leaned posteriorly when  LLE extended in preparation for donning AFO.  Pt had difficulty advancing RLE in swing phase of gait and Lt heel was coming out of shoe due to spasticity and pt weight bearing on Lt forefoot with weight shifted posteriorly.  Much difficulty with gait training today - use of Spry AFO's used on each LE.      Rehab Potential  Fair    Clinical Impairments Affecting Rehab Potential  severity of deficits with previous extensive therapies    PT Frequency  1x / week    PT Duration  12 weeks    PT Treatment/Interventions   ADLs/Self Care Home Management;Therapeutic exercise;Therapeutic activities;Functional mobility training;Gait training;Balance training;Neuromuscular re-education;Patient/family education;Orthotic Fit/Training;Passive range of motion    PT Next Visit Plan  comment on LTGs, gait with B spry step PLS AFOs and shoe covers!    PT Home Exercise Plan  caregiver and husband have previously been instructed in ROM, sitting balance, and transfer training    Consulted and Agree with Plan of Care  Patient;Family member/caregiver       Patient will benefit from skilled therapeutic intervention in order to improve the following deficits and impairments:  Abnormal gait, Decreased activity tolerance, Decreased balance, Decreased cognition, Decreased coordination, Decreased mobility, Decreased range of motion, Decreased endurance, Hypomobility, Decreased strength, Impaired flexibility, Increased muscle spasms, Impaired UE functional use, Postural dysfunction, Pain  Visit Diagnosis: Other abnormalities of gait and mobility  Muscle weakness (generalized)     Problem List Patient Active Problem List   Diagnosis Date Noted  . Coagulase negative Staphylococcus bacteremia 02/09/2017  . Abdominal distension   . Chronic mucus hypersecretion, respiratory 12/31/2016  . Severe protein-calorie malnutrition [name]: less than 60% of standard weight) ([facility]) 12/31/2016  . Acute urinary retention 12/31/2016  . Constipation 12/31/2016  . Shortness of breath   . Respiratory distress   .  Pneumonia 12/05/2016  . Acute respiratory failure ([facility]) 12/05/2016  . Pseudomonas urinary tract infection 10/27/2016  . Severe muscle deconditioning 09/30/2016  . Pressure injury of skin 09/28/2016  . Respiratory failure ([place])   . Tracheobronchitis   . Urinary retention   . PEG (percutaneous endoscopic gastrostomy) status ([facility])   . Benign essential HTN   . Hyponatremia   . Hypokalemia   . Reactive hypertension   . Respiratory  insufficiency 09/24/2016  . Malignant neoplasm of upper-inner quadrant of left breast in female, estrogen receptor positive ([facility]) 07/14/2016  . Heterotopic ossification of bone 03/10/2016  . Acute on chronic respiratory failure with hypoxia ([facility])   . Acute hypoxemic respiratory failure ([facility])   . Acute blood loss anemia   . Atelectasis 02/04/2016  . Debility 02/03/2016  . Pneumonia of both lower lobes due to Pseudomonas species ([place])   . Pneumonia of both lower lobes due to methicillin susceptible Staphylococcus aureus (MSSA) ([place])   . Acute respiratory failure with hypoxia ([place])   . Acute encephalopathy   . Seizures ([place])   . Sepsis ([place])   . Hypoxia   . Pain   . Chronic respiratory failure ([place]) 12/28/2015  . Acute tracheobronchitis 12/24/2015  . Tracheostomy tube present ([place])   . Tracheal stenosis   . Chronic respiratory failure with hypoxia ([place])   . Spastic tetraplegia ([facility]) 10/20/2015  . Tracheostomy status ([facility]) 09/26/2015  . Allergic rhinitis 09/26/2015  . 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 Movement disorder 09/22/2015  . Encephalitis 09/22/2015  . Chest pain 02/07/2014  . Premature atrial contractions 01/13/2014  . Abnormality of gait 12/04/2013  . Hyperlipidemia 12/21/2011  . Cardiovascular risk factor 12/21/2011  . Bunion 01/31/2008  . Metatarsalgia of both feet 09/20/2007  . FLAT FOOT 09/20/2007  . HALLUX RIGIDUS, ACQUIRED 09/20/2007    Alda Lea, PT 09/23/2017, 3:05 PM  [facility] [address] [place], [place], [address] Phone: [phone]   Fax:  [phone]  Name: Judy Spears MRN: [id number] Date of Birth: 1950-07-26

## 2017-09-22 ENCOUNTER — Other Ambulatory Visit: Payer: Self-pay | Admitting: Internal Medicine

## 2017-09-22 ENCOUNTER — Encounter: Payer: Medicare Other | Admitting: Occupational Therapy

## 2017-09-22 ENCOUNTER — Encounter: Payer: Self-pay | Admitting: Occupational Therapy

## 2017-09-22 ENCOUNTER — Ambulatory Visit: Payer: Medicare Other | Admitting: Occupational Therapy

## 2017-09-22 ENCOUNTER — Ambulatory Visit: Payer: Medicare Other | Admitting: Physical Therapy

## 2017-09-22 ENCOUNTER — Ambulatory Visit
Admission: RE | Admit: 2017-09-22 | Discharge: 2017-09-22 | Disposition: A | Discharge status: Home / Self Care | Payer: Medicare Other | Source: Ambulatory Visit | Attending: Internal Medicine | Admitting: Internal Medicine

## 2017-09-22 DIAGNOSIS — M25622 Stiffness of left elbow, not elsewhere classified: Secondary | ICD-10-CM

## 2017-09-22 DIAGNOSIS — R293 Abnormal posture: Secondary | ICD-10-CM

## 2017-09-22 DIAGNOSIS — R29818 Other symptoms and signs involving the nervous system: Secondary | ICD-10-CM

## 2017-09-22 DIAGNOSIS — M6281 Muscle weakness (generalized): Secondary | ICD-10-CM

## 2017-09-22 DIAGNOSIS — R471 Dysarthria and anarthria: Secondary | ICD-10-CM | POA: Diagnosis not present

## 2017-09-22 DIAGNOSIS — M25611 Stiffness of right shoulder, not elsewhere classified: Secondary | ICD-10-CM

## 2017-09-22 DIAGNOSIS — M25612 Stiffness of left shoulder, not elsewhere classified: Secondary | ICD-10-CM

## 2017-09-22 DIAGNOSIS — R2681 Unsteadiness on feet: Secondary | ICD-10-CM

## 2017-09-22 DIAGNOSIS — M79604 Pain in right leg: Secondary | ICD-10-CM

## 2017-09-22 DIAGNOSIS — R41844 Frontal lobe and executive function deficit: Secondary | ICD-10-CM

## 2017-09-22 DIAGNOSIS — R2689 Other abnormalities of gait and mobility: Secondary | ICD-10-CM

## 2017-09-22 NOTE — Therapy (Signed)
[facility] [address] [place], [place], [address] Phone: [phone]   Fax:  [phone]  Occupational Therapy Treatment  Patient Details  Name: Judy Spears MRN: [id number] Date of Birth: 1950-11-24 Referring Provider: Dr. Levada Schilling   Encounter Date: 09/22/2017  OT End of Session - 09/22/17 1229    Visit Number  19    Number of Visits  24    Date for OT Re-Evaluation  09/28/17    Authorization Type  medicare maintenance program - pt to be reassessed every 30 days per Medicare guidelines  Will need PN every 10th visit    Authorization Time Period  90 days - 09/28/2017    Authorization - Visit Number  54    Authorization - Number of Visits  20    OT Start Time  1104    OT Stop Time  1146    OT Time Calculation (min)  42 min       Past Medical History:  Diagnosis Date  . Arthritis    lt great toe  . Complication of anesthesia    pt has had headaches post op-did advise to hydra well preop  . Hair loss    right-sided  . History of exercise stress test    a. ETT (12/15) with 12:00 exercise, no ST changes, occasional PACs.   . Hyperlipidemia   . Insomnia   . Migraine headache   . Movement disorder    resltess in left legs  . Postmenopausal   . 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 Premature atrial contractions    a. Holter (12/15) with 8% PACs, no atrial runs or atrial fibrillation.     Past Surgical History:  Procedure Laterality Date  . BREAST BIOPSY  01/01/2011   Procedure: BREAST BIOPSY WITH NEEDLE LOCALIZATION;  Surgeon: Rolm Bookbinder, MD;  Location: [facility];  Service: General;  Laterality: Left;  left breast wire localization biopsy  . BREAST BIOPSY Right 07/27/2012  . BREAST BIOPSY Right 07/07/2016  . BREAST BIOPSY Left 06/30/2016  . cataracts right eye    . CESAREAN SECTION  1986  . HERNIA REPAIR  2000   RIH  . INTRAMEDULLARY (IM) NAIL INTERTROCHANTERIC Left 01/30/2017    Procedure: ORIF AFFIXUS NAIL;  Surgeon: Paralee Cancel, MD;  Location: [facility];  Service: Orthopedics;  Laterality: Left;  . IR REPLC GASTRO/COLONIC TUBE PERCUT W/FLUORO  09/29/2016  . opirectinal membrane peel    . PEG PLACEMENT    . RHINOPLASTY  1983  . TRACHEOSTOMY    . TRACHEOSTOMY TUBE PLACEMENT N/A 12/15/2016   Procedure: TRACHEOSTOMY CHANGE;  Surgeon: [name] Montane, MD;  Location: [facility];  Service: ENT;  Laterality: N/A;    There were no vitals filed for this visit.  Subjective Assessment - 09/22/17 1221    Patient is accompained by:  Family member   caregiver [name]   Pertinent History  see epic snapshot - ABI/encephalitis/hypoxia from virus; recent hospitalization due to MRSA PNA     Patient Stated Goals  Pt unable to state.     Currently in Pain?  Yes    Pain Score  9     Pain Location  Leg    Pain Orientation  Right    Pain Descriptors / Indicators  Sore    Pain Type  Acute pain    Pain Onset  1 to 4 weeks ago    Pain Frequency  Intermittent    Aggravating Factors  With weight bearing in standing    Pain Relieving Factors  sit or lay down                   OT Treatments/Exercises (OP) - 09/22/17 0001      Neurological Re-education Exercises   Other Exercises 1  Neuro re ed to address reducing tone (severe throughout today but especially in RLE) in supine with lower trunk initiated flexion/rotation. Asessed pain in sitting, standing, during transfers. Pt feels pain only in standing today and with weight shift onto RLE.  Unable to palpate any tenderness or muscle spasm on RLE or buttocks area. Pain relieved when pt is not standing on RLE. Caregiver reports it took 45 minutes yesterday for pt to move from squatting in steady assist to sitting on toilet due to pain. Pt tearful at end of session stating "when I feel it its really bad - a 9"  Caregiver to follow up with husband and MD.                  OT Long Term Goals - 09/22/17 1227      OT [place] #1   Title  Pt will maintain ability to roll in bed during self care tasks with no more than max a x1 - reasssess 10/06/2017 (date adjusted as pt missed one week of therapy due to being out of town for medical reasons)    Baseline  09/01/2017  pt able to maintain rolling with max a x1     Status  On-going      OT LONG TERM GOAL #2   Title  Pt will maintain the ability to use roll in shower chair with care giver assistance     Baseline  09/01/2017  pt continues to demonsrate adequate trunk control, sititng balance and decreased tone to use shower chair with care giver assistance.    Status  On-going      OT LONG TERM GOAL #3   Title  Pt will maintain ability to transfer to toilet with no more than max a x1 using Steady Assist - 10/06/2017    Baseline  09/01/2017 pt transfers with steady assist to toilet with max a x1    Status  On-going      OT LONG TERM GOAL #4   Title  Pt wil maintain ability for dressing at max a x1.     Baseline  09/01/2017  pt able to be assisted with dressing by 1 person only at max a level    Status  On-going      OT LONG TERM GOAL #5   Title  Pt will maintain ability to stand with max a x1 to for clothing mgmt with toileting.     Baseline  09/01/2017  pt able to stand with max a x 1 during toileting tasks    Status  On-going      OT LONG TERM GOAL #6   Title  Pt will maintain ability to participate in functional transfers, as well as stand to sit and sit to stand with no more than max a x1    Status  On-going   8/82019  pt able ot transfer and complete sit to stand with max a x1     OT LONG TERM GOAL #7   Title  Pt will maintain functional mobility and trunk control to aid in her ability to clear sucretions and assist with phonation to express basic needs/wants  09/01/2017  pt has remained free of respiratory issues and intermittently improved phonation   Status  On-going            Plan - 09/22/17 1228    Clinical Impression Statement  Pt with signficant  pain in back of RLE and buttocks again today with weight bearing on RLE in standing. Pt with severely increased tone throughout but especially in RLE.     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    Occupational performance deficits (Please refer to evaluation for details):  ADL's;IADL's;Rest and Sleep;Leisure;Social Participation;Work    Neurosurgeon    Current Impairments/barriers affecting progress:  severity of multi system deficits, intermittent medical issues    OT Frequency  2x / week    OT Duration  12 weeks    OT Treatment/Interventions  Self-care/ADL training;Ultrasound;Moist Heat;Electrical Stimulation;DME and/or AE instruction;Neuromuscular education;Therapeutic exercise;Functional Mobility Training;Manual Therapy;Passive range of motion;Splinting;Therapeutic activities;Balance training;Patient/family education;Visual/perceptual remediation/compensation;Cognitive remediation/compensation    Plan  address rigidity and tone in trunk and extremeties, NMR trunk and LE's for transitional movements, static standing, sitting balance, sit to stand, stand to sit, stepping;     Consulted and Agree with Plan of Care  Patient;Family member/caregiver    Family Member Consulted  caregiver [name], husband [name]       Patient will benefit from skilled therapeutic intervention in order to improve the following deficits and impairments:  Decreased endurance, Decreased skin integrity, Impaired vision/preception, Improper body mechanics, Impaired perceived functional ability, Decreased knowledge of precautions, Cardiopulmonary status limiting activity, Decreased activity tolerance, Decreased knowledge of use of DME, Decreased strength, Impaired flexibility, Improper spinal/pelvic  alignment, Impaired sensation, Difficulty walking, Decreased mobility, Decreased balance, Decreased cognition, Decreased range of motion, Impaired tone, Impaired UE functional use, Decreased safety awareness, Decreased coordination, Pain, Abnormal gait  Visit Diagnosis: Other abnormalities of gait and mobility  Muscle weakness (generalized)  Abnormal posture  Other symptoms and signs involving the nervous system  Unsteadiness on feet  Frontal lobe and executive function deficit  Stiffness of right shoulder, not elsewhere classified  Stiffness of left shoulder, not elsewhere classified  Stiffness of left elbow, not elsewhere classified    Problem List Patient Active Problem List   Diagnosis Date Noted  . Coagulase negative Staphylococcus bacteremia 02/09/2017  . Abdominal distension   . Chronic mucus hypersecretion, respiratory 12/31/2016  . Severe protein-calorie malnutrition [name]: less than 60% of standard weight) ([place]) 12/31/2016  . Acute urinary retention 12/31/2016  . Constipation 12/31/2016  . Shortness of breath   . Respiratory distress   . Pneumonia 12/05/2016  . Acute respiratory failure ([place] [facility]) 12/05/2016  . Pseudomonas urinary tract infection 10/27/2016  . Severe muscle deconditioning 09/30/2016  . Pressure injury of skin 09/28/2016  . Respiratory failure ([place])   . Tracheobronchitis   . Urinary retention   . PEG (percutaneous endoscopic gastrostomy) status ([place])   . Benign essential HTN   . Hyponatremia   . Hypokalemia   . Reactive hypertension   . Respiratory insufficiency 09/24/2016  . Malignant neoplasm of upper-inner quadrant of left breast in female, estrogen receptor positive ([place]) 07/14/2016  . Heterotopic ossification of bone 03/10/2016  . Acute on chronic respiratory failure with hypoxia ([place])   . Acute hypoxemic respiratory failure ([place])   . Acute blood loss anemia   . Atelectasis 02/04/2016  .  Debility 02/03/2016  . Pneumonia of both lower  lobes due to Pseudomonas species ([place])   . Pneumonia of both lower lobes due to methicillin susceptible Staphylococcus aureus (MSSA) ([place])   . Acute respiratory failure with hypoxia ([place])   . Acute encephalopathy   . Seizures ([place])   . Sepsis ([place])   . Hypoxia   . Pain   . Chronic respiratory failure ([place]) 12/28/2015  . Acute tracheobronchitis 12/24/2015  . Tracheostomy tube present ([place])   . Tracheal stenosis   . Chronic respiratory failure with hypoxia ([place])   . Spastic tetraplegia ([place]) 10/20/2015  . Tracheostomy status ([place]) 09/26/2015  . Allergic rhinitis 09/26/2015  . 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 Movement disorder 09/22/2015  . Encephalitis 09/22/2015  . Chest pain 02/07/2014  . Premature atrial contractions 01/13/2014  . Abnormality of gait 12/04/2013  . Hyperlipidemia 12/21/2011  . Cardiovascular risk factor 12/21/2011  . Bunion 01/31/2008  . Metatarsalgia of both feet 09/20/2007  . FLAT FOOT 09/20/2007  . HALLUX RIGIDUS, ACQUIRED 09/20/2007    Quay Burow, OTR/L 09/22/2017, 12:30 PM  [facility] [address] [place], [place], [address] Phone: [phone]   Fax:  [phone]  Name: Judy Spears MRN: [id number] Date of Birth: 02-May-1950

## 2017-09-27 ENCOUNTER — Ambulatory Visit: Payer: Medicare Other | Attending: Physician Assistant | Admitting: Occupational Therapy

## 2017-09-27 ENCOUNTER — Encounter: Payer: Self-pay | Admitting: Occupational Therapy

## 2017-09-27 DIAGNOSIS — M25612 Stiffness of left shoulder, not elsewhere classified: Secondary | ICD-10-CM | POA: Insufficient documentation

## 2017-09-27 DIAGNOSIS — R471 Dysarthria and anarthria: Secondary | ICD-10-CM | POA: Insufficient documentation

## 2017-09-27 DIAGNOSIS — R278 Other lack of coordination: Secondary | ICD-10-CM | POA: Diagnosis present

## 2017-09-27 DIAGNOSIS — M25611 Stiffness of right shoulder, not elsewhere classified: Secondary | ICD-10-CM | POA: Insufficient documentation

## 2017-09-27 DIAGNOSIS — R293 Abnormal posture: Secondary | ICD-10-CM | POA: Insufficient documentation

## 2017-09-27 DIAGNOSIS — R2681 Unsteadiness on feet: Secondary | ICD-10-CM | POA: Diagnosis present

## 2017-09-27 DIAGNOSIS — R2689 Other abnormalities of gait and mobility: Secondary | ICD-10-CM | POA: Insufficient documentation

## 2017-09-27 DIAGNOSIS — R1312 Dysphagia, oropharyngeal phase: Secondary | ICD-10-CM | POA: Diagnosis present

## 2017-09-27 DIAGNOSIS — M25622 Stiffness of left elbow, not elsewhere classified: Secondary | ICD-10-CM | POA: Diagnosis present

## 2017-09-27 DIAGNOSIS — M6281 Muscle weakness (generalized): Secondary | ICD-10-CM | POA: Diagnosis present

## 2017-09-27 DIAGNOSIS — R41844 Frontal lobe and executive function deficit: Secondary | ICD-10-CM | POA: Insufficient documentation

## 2017-09-27 DIAGNOSIS — R41841 Cognitive communication deficit: Secondary | ICD-10-CM | POA: Insufficient documentation

## 2017-09-27 DIAGNOSIS — M25652 Stiffness of left hip, not elsewhere classified: Secondary | ICD-10-CM | POA: Diagnosis present

## 2017-09-27 DIAGNOSIS — R29818 Other symptoms and signs involving the nervous system: Secondary | ICD-10-CM | POA: Diagnosis present

## 2017-09-27 DIAGNOSIS — M25651 Stiffness of right hip, not elsewhere classified: Secondary | ICD-10-CM | POA: Diagnosis present

## 2017-09-27 DIAGNOSIS — R4701 Aphasia: Secondary | ICD-10-CM | POA: Insufficient documentation

## 2017-09-27 NOTE — Therapy (Deleted)
[facility] [address] [place], [place], [address] Phone: [phone]   Fax:  [phone]  Occupational Therapy Treatment  Patient Details  Name: Judy Spears MRN: [id number] Date of Birth: [birth year] Referring Provider: Dr. Levada Schilling   Encounter Date: 09/27/2017  OT End of Session - 09/27/17 1642    Visit Number  20    Number of Visits  24    Date for OT Re-Evaluation  09/28/17    Authorization Type  medicare maintenance program - pt to be reassessed every 30 days per Medicare guidelines  Will need PN every 10th visit    Authorization Time Period  90 days - 09/28/2017    Authorization - Visit Number  24    Authorization - Number of Visits  30    OT Start Time  1884    OT Stop Time  1445    OT Time Calculation (min)  42 min    Activity Tolerance  Patient tolerated treatment well       Past Medical History:  Diagnosis Date  . Arthritis    lt great toe  . Complication of anesthesia    pt has had headaches post op-did advise to hydra well preop  . Hair loss    right-sided  . History of exercise stress test    a. ETT (12/15) with 12:00 exercise, no ST changes, occasional PACs.   . Hyperlipidemia   . Insomnia   . Migraine headache   . Movement disorder    resltess in left legs  . Postmenopausal   . 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 Premature atrial contractions    a. Holter (12/15) with 8% PACs, no atrial runs or atrial fibrillation.     Past Surgical History:  Procedure Laterality Date  . BREAST BIOPSY  01/01/2011   Procedure: BREAST BIOPSY WITH NEEDLE LOCALIZATION;  Surgeon: Rolm Bookbinder, MD;  Location: [facility];  Service: General;  Laterality: Left;  left breast wire localization biopsy  . BREAST BIOPSY Right 07/27/2012  . BREAST BIOPSY Right 07/07/2016  . BREAST BIOPSY Left 06/30/2016  . cataracts right eye    . CESAREAN SECTION  1986  . HERNIA REPAIR  2000   RIH  .  INTRAMEDULLARY (IM) NAIL INTERTROCHANTERIC Left 01/30/2017   Procedure: ORIF AFFIXUS NAIL;  Surgeon: Paralee Cancel, MD;  Location: [facility];  Service: Orthopedics;  Laterality: Left;  . IR REPLC GASTRO/COLONIC TUBE PERCUT W/FLUORO  09/29/2016  . opirectinal membrane peel    . PEG PLACEMENT    . RHINOPLASTY  1983  . TRACHEOSTOMY    . TRACHEOSTOMY TUBE PLACEMENT N/A 12/15/2016   Procedure: TRACHEOSTOMY CHANGE;  Surgeon: [name] Montane, MD;  Location: [facility];  Service: ENT;  Laterality: N/A;    There were no vitals filed for this visit.  Subjective Assessment - 09/27/17 1631    Subjective   Pt with no c/o of R leg pain today - xray was negative    Patient is accompained by:  Family member   husban [name], caregiver [name]   Pertinent History  see epic snapshot - ABI/encephalitis/hypoxia from virus; recent hospitalization due to MRSA PNA     Patient Stated Goals  Pt unable to state.     Currently in Pain?  No/denies                   OT Treatments/Exercises (OP) - 09/27/17 0001      Neurological Re-education Exercises  Other Exercises 1  Neuro re ed to address forward weight shift with pattens of flexion/rotation in chair to decrease tone througout.  Pt continues to display increased overall tone however not as significant as last few sessions.  Worked in parellel bars to address foward weight translation and sit to stand while facilitating midline as much as possible.  Addressed thoracic extension with ability for pt to initiate lateral anterior weight shifts in order to allow stepping. Pt needs max cues for each part of stepping as well as max facilitation to address decreasing tone, increasing active weight shfting and active postural alignment to increase ability for pt to translate weight onto one leg while stepping with the other.  ALso worked on pt being able to grade how "on" she is  - with pratice and cueing pt was able to "soften" legs while still allowing enough activity for standing.  Educated caregiver on way to facilitate weight shifting side to side when pt is standing in order to decrease tone. Caregiver able to return demonstrate.              OT Education - 09/27/17 1641    Education provided  Yes    Education Details  education for caregiver on strategy to assist in helping pt to complete lateral weight shifting in order to decrease tone.     Person(s) Educated  Patient;Caregiver(s)    Methods  Explanation;Demonstration;Tactile cues;Verbal cues    Comprehension  Verbalized understanding;Returned demonstration       OT Short Term Goals - 07/14/17 1433      OT SHORT TERM GOAL #1   Title  --    Status  --      OT SHORT TERM GOAL #2   Title  --    Status  --      OT SHORT TERM GOAL #6   Title  --      OT SHORT TERM GOAL #7   Title  --        OT Long Term Goals - 09/27/17 1641      OT LONG TERM GOAL #1   Title  Pt will maintain ability to roll in bed during self care tasks with no more than max a x1 - reasssess 10/25/2017    Baseline  09/27/2017  pt able to maintain rolling with max a x1     Status  On-going   09/27/2017 pt able to roll with max a x1     OT LONG TERM GOAL #2   Title  Pt will maintain the ability to use roll in shower chair with care giver assistance     Baseline  09/27/2017 pt continues to demonsrate adequate trunk control, sititng balance and decreased tone to use shower chair with care giver assistance.    Status  On-going      OT LONG TERM GOAL #3   Title  Pt will maintain ability to transfer to toilet with no more than max a x1 using Steady Assist - 10/06/2017    Baseline  09/27/2017 pt transfers with steady assist to toilet with max a x1    Status  On-going      OT LONG TERM GOAL #4   Title  Pt wil maintain ability for dressing at max a x1.     Baseline  09/27/2017  pt able to be assisted with dressing by 1 person only at max a level    Status  On-going      OT LONG  TERM GOAL #5   Title  Pt will maintain ability to stand  with max a x1 to for clothing mgmt with toileting.     Baseline  09/27/2017  pt able to stand with max a x 1 during toileting tasks    Status  On-going      OT LONG TERM GOAL #6   Title  Pt will maintain ability to participate in functional transfers, as well as stand to sit and sit to stand with no more than max a x1    Status  On-going   09/27/2017  pt able ot transfer and complete sit to stand with max a x1     OT LONG TERM GOAL #7   Title  Pt will maintain functional mobility and trunk control to aid in her ability to clear sucretions and assist with phonation to express basic needs/wants   09/27/2017 pt has remained free of respiratory issues and intermittently improved phonation   Status  On-going            Plan - 09/27/17 1642    Clinical Impression Statement  Pt had xray of RLE which was negative but did show extensive osteopenia. Pt with no c/o of pain today.      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    Occupational performance deficits (Please refer to evaluation for details):  ADL's;IADL's;Rest and Sleep;Leisure;Social Participation;Work    Neurosurgeon    Current Impairments/barriers affecting progress:  severity of multi system deficits, intermittent medical issues    OT Frequency  2x / week    OT Duration  12 weeks    OT Treatment/Interventions  Self-care/ADL training;Ultrasound;Moist Heat;Electrical Stimulation;DME and/or AE instruction;Neuromuscular education;Therapeutic exercise;Functional Mobility Training;Manual Therapy;Passive range of motion;Splinting;Therapeutic activities;Balance training;Patient/family education;Visual/perceptual remediation/compensation;Cognitive remediation/compensation    Plan  address rigidity and tone in trunk and  extremeties, NMR trunk and LE's for transitional movements, static standing, sitting balance, sit to stand, stand to sit, stepping;     Consulted and Agree with Plan of Care  Patient;Family member/caregiver    Family Member Consulted  caregiver [name], husband [name]       Patient will benefit from skilled therapeutic intervention in order to improve the following deficits and impairments:  Decreased endurance, Decreased skin integrity, Impaired vision/preception, Improper body mechanics, Impaired perceived functional ability, Decreased knowledge of precautions, Cardiopulmonary status limiting activity, Decreased activity tolerance, Decreased knowledge of use of DME, Decreased strength, Impaired flexibility, Improper spinal/pelvic alignment, Impaired sensation, Difficulty walking, Decreased mobility, Decreased balance, Decreased cognition, Decreased range of motion, Impaired tone, Impaired UE functional use, Decreased safety awareness, Decreased coordination, Pain, Abnormal gait  Visit Diagnosis: Other abnormalities of gait and mobility - Plan: Ot plan of care cert/re-cert  Muscle weakness (generalized) - Plan: Ot plan of care cert/re-cert  Abnormal posture - Plan: Ot plan of care cert/re-cert  Other symptoms and signs involving the nervous system - Plan: Ot plan of care cert/re-cert  Unsteadiness on feet - Plan: Ot plan of care cert/re-cert  Frontal lobe and executive function deficit - Plan: Ot plan of care cert/re-cert  Stiffness of right shoulder, not elsewhere classified - Plan: Ot plan of care cert/re-cert  Stiffness of left shoulder, not elsewhere classified - Plan: Ot plan of care cert/re-cert  Stiffness of left elbow, not elsewhere classified - Plan: Ot  plan of care cert/re-cert    Problem List Patient Active Problem List   Diagnosis Date Noted  . Coagulase negative Staphylococcus bacteremia 02/09/2017  . Abdominal distension   . Chronic mucus hypersecretion, respiratory  12/31/2016  . Severe protein-calorie malnutrition [name]: less than 60% of standard weight) ([facility]) 12/31/2016  . Acute urinary retention 12/31/2016  . Constipation 12/31/2016  . Shortness of breath   . Respiratory distress   . Pneumonia 12/05/2016  . Acute respiratory failure ([facility]) 12/05/2016  . Pseudomonas urinary tract infection 10/27/2016  . Severe muscle deconditioning 09/30/2016  . Pressure injury of skin 09/28/2016  . Respiratory failure ([place])   . Tracheobronchitis   . Urinary retention   . PEG (percutaneous endoscopic gastrostomy) status ([facility])   . Benign essential HTN   . Hyponatremia   . Hypokalemia   . Reactive hypertension   . Respiratory insufficiency 09/24/2016  . Malignant neoplasm of upper-inner quadrant of left breast in female, estrogen receptor positive ([place]) 07/14/2016  . Heterotopic ossification of bone 03/10/2016  . Acute on chronic respiratory failure with hypoxia ([facility])   . Acute hypoxemic respiratory failure ([facility])   . Acute blood loss anemia   . Atelectasis 02/04/2016  . Debility 02/03/2016  . Pneumonia of both lower lobes due to Pseudomonas species ([facility])   . Pneumonia of both lower lobes due to methicillin susceptible Staphylococcus aureus (MSSA) ([place])   . Acute respiratory failure with hypoxia ([facility])   . Acute encephalopathy   . Seizures ([place])   . Sepsis ([facility])   . Hypoxia   . Pain   . Chronic respiratory failure ([facility]) 12/28/2015  . Acute tracheobronchitis 12/24/2015  . Tracheostomy tube present ([place])   . Tracheal stenosis   . Chronic respiratory failure with hypoxia ([place] [facility] [facility])   . 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 Tracheostomy status ([place]) 09/26/2015  . Allergic rhinitis 09/26/2015  . 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 Movement disorder 09/22/2015  . Encephalitis 09/22/2015  . Chest pain 02/07/2014  . Premature atrial contractions 01/13/2014  . Abnormality of gait 12/04/2013  . Hyperlipidemia 12/21/2011  . Cardiovascular risk  factor 12/21/2011  . Bunion 01/31/2008  . Metatarsalgia of both feet 09/20/2007  . FLAT FOOT 09/20/2007  . HALLUX RIGIDUS, ACQUIRED 09/20/2007    Quay Burow 09/27/2017, 4:51 PM  [facility] [address] [place], [place], [address] Phone: [phone]   Fax:  [phone]  Name: Dontasia Miranda MRN: [id number] Date of Birth: 1950/03/19

## 2017-09-27 NOTE — Therapy (Signed)
[facility] [address] [place], [place], [address] Phone: [phone]   Fax:  [phone]  Occupational Therapy Treatment  Patient Details  Name: Judy Spears MRN: [id number] Date of Birth: [birth year] Referring Provider: Dr. Levada Schilling   Encounter Date: 09/27/2017  OT End of Session - 09/27/17 1642    Visit Number  20    Number of Visits  99    Date for OT Re-Evaluation  12/20/17    Authorization Type  medicare maintenance program - pt to be reassessed every 30 days per Medicare guidelines  Will need PN every 10th visit    Authorization Time Period  90 days - 12/20/2017    Authorization - Visit Number  19    Authorization - Number of Visits  30    OT Start Time  9242    OT Stop Time  1445    OT Time Calculation (min)  42 min    Activity Tolerance  Patient tolerated treatment well       Past Medical History:  Diagnosis Date  . Arthritis    lt great toe  . Complication of anesthesia    pt has had headaches post op-did advise to hydra well preop  . Hair loss    right-sided  . History of exercise stress test    a. ETT (12/15) with 12:00 exercise, no ST changes, occasional PACs.   . Hyperlipidemia   . Insomnia   . Migraine headache   . Movement disorder    resltess in left legs  . Postmenopausal   . 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 Premature atrial contractions    a. Holter (12/15) with 8% PACs, no atrial runs or atrial fibrillation.     Past Surgical History:  Procedure Laterality Date  . BREAST BIOPSY  01/01/2011   Procedure: BREAST BIOPSY WITH NEEDLE LOCALIZATION;  Surgeon: Rolm Bookbinder, MD;  Location: [facility];  Service: General;  Laterality: Left;  left breast wire localization biopsy  . BREAST BIOPSY Right 07/27/2012  . BREAST BIOPSY Right 07/07/2016  . BREAST BIOPSY Left 06/30/2016  . cataracts right eye    . CESAREAN SECTION  1986  . HERNIA REPAIR  2000   RIH  .  INTRAMEDULLARY (IM) NAIL INTERTROCHANTERIC Left 01/30/2017   Procedure: ORIF AFFIXUS NAIL;  Surgeon: Paralee Cancel, MD;  Location: [facility];  Service: Orthopedics;  Laterality: Left;  . IR REPLC GASTRO/COLONIC TUBE PERCUT W/FLUORO  09/29/2016  . opirectinal membrane peel    . PEG PLACEMENT    . RHINOPLASTY  1983  . TRACHEOSTOMY    . TRACHEOSTOMY TUBE PLACEMENT N/A 12/15/2016   Procedure: TRACHEOSTOMY CHANGE;  Surgeon: [name] Montane, MD;  Location: [facility];  Service: ENT;  Laterality: N/A;    There were no vitals filed for this visit.  Subjective Assessment - 09/27/17 1631    Subjective   Pt with no c/o of R leg pain today - xray was negative    Patient is accompained by:  Family member   husban [name], caregiver [name]   Pertinent History  see epic snapshot - ABI/encephalitis/hypoxia from virus; recent hospitalization due to MRSA PNA     Patient Stated Goals  Pt unable to state.     Currently in Pain?  No/denies                   OT Treatments/Exercises (OP) - 09/27/17 0001      Neurological Re-education Exercises  Other Exercises 1  Neuro re ed to address forward weight shift with pattens of flexion/rotation in chair to decrease tone througout.  Pt continues to display increased overall tone however not as significant as last few sessions.  Worked in parellel bars to address foward weight translation and sit to stand while facilitating midline as much as possible.  Addressed thoracic extension with ability for pt to initiate lateral anterior weight shifts in order to allow stepping. Pt needs max cues for each part of stepping as well as max facilitation to address decreasing tone, increasing active weight shfting and active postural alignment to increase ability for pt to translate weight onto one leg while stepping with the other.  ALso worked on pt being able to grade how "on" she is  - with pratice and cueing pt was able to "soften" legs while still allowing enough activity for standing.  Educated caregiver on way to facilitate weight shifting side to side when pt is standing in order to decrease tone. Caregiver able to return demonstrate.              OT Education - 09/27/17 1641    Education provided  Yes    Education Details  education for caregiver on strategy to assist in helping pt to complete lateral weight shifting in order to decrease tone.     Person(s) Educated  Patient;Caregiver(s)    Methods  Explanation;Demonstration;Tactile cues;Verbal cues    Comprehension  Verbalized understanding;Returned demonstration          OT Long Term Goals - 09/27/17 1641      OT LONG TERM GOAL #1   Title  Pt will maintain ability to roll in bed during self care tasks with no more than max a x1 - reasssess 10/25/2017    Baseline  09/27/2017  pt able to maintain rolling with max a x1     Status  On-going   09/27/2017 pt able to roll with max a x1     OT LONG TERM GOAL #2   Title  Pt will maintain the ability to use roll in shower chair with care giver assistance     Baseline  09/27/2017 pt continues to demonsrate adequate trunk control, sititng balance and decreased tone to use shower chair with care giver assistance.    Status  On-going      OT LONG TERM GOAL #3   Title  Pt will maintain ability to transfer to toilet with no more than max a x1 using Steady Assist - 10/06/2017    Baseline  09/27/2017 pt transfers with steady assist to toilet with max a x1    Status  On-going      OT LONG TERM GOAL #4   Title  Pt wil maintain ability for dressing at max a x1.     Baseline  09/27/2017  pt able to be assisted with dressing by 1 person only at max a level    Status  On-going      OT LONG TERM GOAL #5   Title  Pt will maintain ability to stand with max a x1 to for clothing mgmt with toileting.     Baseline  09/27/2017  pt able to stand with max a x 1 during toileting tasks    Status  On-going      OT LONG TERM GOAL #6   Title  Pt will maintain ability to participate in  functional transfers, as well as stand to sit and sit to stand  with no more than max a x1    Status  On-going   09/27/2017  pt able ot transfer and complete sit to stand with max a x1     OT LONG TERM GOAL #7   Title  Pt will maintain functional mobility and trunk control to aid in her ability to clear sucretions and assist with phonation to express basic needs/wants   09/27/2017 pt has remained free of respiratory issues and intermittently improved phonation   Status  On-going            Plan - 09/27/17 1642    Clinical Impression Statement  Pt had xray of RLE which was negative but did show extensive osteopenia. Pt with no c/o of pain today.      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    Occupational performance deficits (Please refer to evaluation for details):  ADL's;IADL's;Rest and Sleep;Leisure;Social Participation;Work    Neurosurgeon    Current Impairments/barriers affecting progress:  severity of multi system deficits, intermittent medical issues    OT Frequency  2x / week    OT Duration  12 weeks    OT Treatment/Interventions  Self-care/ADL training;Ultrasound;Moist Heat;Electrical Stimulation;DME and/or AE instruction;Neuromuscular education;Therapeutic exercise;Functional Mobility Training;Manual Therapy;Passive range of motion;Splinting;Therapeutic activities;Balance training;Patient/family education;Visual/perceptual remediation/compensation;Cognitive remediation/compensation    Plan  address rigidity and tone in trunk and extremeties, NMR trunk and LE's for transitional movements, static standing, sitting balance, sit to stand, stand to sit, stepping;     Consulted and Agree with Plan of Care  Patient;Family member/caregiver    Family Member  Consulted  caregiver [name], husband [name]       Patient will benefit from skilled therapeutic intervention in order to improve the following deficits and impairments:  Decreased endurance, Decreased skin integrity, Impaired vision/preception, Improper body mechanics, Impaired perceived functional ability, Decreased knowledge of precautions, Cardiopulmonary status limiting activity, Decreased activity tolerance, Decreased knowledge of use of DME, Decreased strength, Impaired flexibility, Improper spinal/pelvic alignment, Impaired sensation, Difficulty walking, Decreased mobility, Decreased balance, Decreased cognition, Decreased range of motion, Impaired tone, Impaired UE functional use, Decreased safety awareness, Decreased coordination, Pain, Abnormal gait  Visit Diagnosis: Other abnormalities of gait and mobility - Plan: Ot plan of care cert/re-cert  Muscle weakness (generalized) - Plan: Ot plan of care cert/re-cert  Abnormal posture - Plan: Ot plan of care cert/re-cert  Other symptoms and signs involving the nervous system - Plan: Ot plan of care cert/re-cert  Unsteadiness on feet - Plan: Ot plan of care cert/re-cert  Frontal lobe and executive function deficit - Plan: Ot plan of care cert/re-cert  Stiffness of right shoulder, not elsewhere classified - Plan: Ot plan of care cert/re-cert  Stiffness of left shoulder, not elsewhere classified - Plan: Ot plan of care cert/re-cert  Stiffness of left elbow, not elsewhere classified - Plan: Ot plan of care cert/re-cert    Problem List Patient Active Problem List   Diagnosis Date Noted  . Coagulase negative Staphylococcus bacteremia 02/09/2017  . Abdominal distension   . Chronic mucus hypersecretion, respiratory 12/31/2016  . Severe protein-calorie malnutrition [name]: less than 60% of standard weight) ([place]) 12/31/2016  . Acute urinary retention 12/31/2016  . Constipation 12/31/2016  . Shortness of breath   . Respiratory distress   .  Pneumonia 12/05/2016  . Acute respiratory failure ([place])  12/05/2016  . Pseudomonas urinary tract infection 10/27/2016  . Severe muscle deconditioning 09/30/2016  . Pressure injury of skin 09/28/2016  . Respiratory failure ([place])   . Tracheobronchitis   . Urinary retention   . PEG (percutaneous endoscopic gastrostomy) status ([place])   . Benign essential HTN   . Hyponatremia   . Hypokalemia   . Reactive hypertension   . Respiratory insufficiency 09/24/2016  . Malignant neoplasm of upper-inner quadrant of left breast in female, estrogen receptor positive ([place]) 07/14/2016  . Heterotopic ossification of bone 03/10/2016  . Acute on chronic respiratory failure with hypoxia ([facility])   . Acute hypoxemic respiratory failure ([place])   . Acute blood loss anemia   . Atelectasis 02/04/2016  . Debility 02/03/2016  . Pneumonia of both lower lobes due to Pseudomonas species ([place])   . Pneumonia of both lower lobes due to methicillin susceptible Staphylococcus aureus (MSSA) ([place])   . Acute respiratory failure with hypoxia ([place])   . Acute encephalopathy   . Seizures ([place])   . Sepsis ([place])   . Hypoxia   . Pain   . Chronic respiratory failure ([place]) 12/28/2015  . Acute tracheobronchitis 12/24/2015  . Tracheostomy tube present ([place])   . Tracheal stenosis   . Chronic respiratory failure with hypoxia ([place])   . 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 Tracheostomy status ([place]) 09/26/2015  . Allergic rhinitis 09/26/2015  . 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 Movement disorder 09/22/2015  . Encephalitis 09/22/2015  . Chest pain 02/07/2014  . Premature atrial contractions 01/13/2014  . Abnormality of gait 12/04/2013  . Hyperlipidemia 12/21/2011  . Cardiovascular risk factor 12/21/2011  . Bunion 01/31/2008  . Metatarsalgia of both feet 09/20/2007  . FLAT FOOT 09/20/2007  . HALLUX RIGIDUS, ACQUIRED 09/20/2007   .Occupational Therapy Progress Note  Dates of Reporting Period: 08/17/2017 to  09/27/2017  Objective Reports of Subjective Statement: see above  Objective Measurements: see above  Goal Update: see above  Plan: see above  Reason Skilled Services are Required: see above  Quay Burow, OTR/L 09/27/2017, 4:58 PM  [facility] [address] [place], [place], [address] Phone: [phone]   Fax:  [phone]  Name: Ramina Hulet MRN: [id number] Date of Birth: [birth year]

## 2017-09-29 ENCOUNTER — Ambulatory Visit: Payer: Medicare Other | Admitting: Occupational Therapy

## 2017-09-29 ENCOUNTER — Encounter: Payer: Self-pay | Admitting: Occupational Therapy

## 2017-09-29 ENCOUNTER — Encounter: Payer: Self-pay | Admitting: Rehabilitation

## 2017-09-29 ENCOUNTER — Ambulatory Visit: Payer: Medicare Other

## 2017-09-29 ENCOUNTER — Ambulatory Visit: Payer: Medicare Other | Admitting: Rehabilitation

## 2017-09-29 DIAGNOSIS — R29818 Other symptoms and signs involving the nervous system: Secondary | ICD-10-CM

## 2017-09-29 DIAGNOSIS — R1312 Dysphagia, oropharyngeal phase: Secondary | ICD-10-CM

## 2017-09-29 DIAGNOSIS — R471 Dysarthria and anarthria: Secondary | ICD-10-CM

## 2017-09-29 DIAGNOSIS — R2681 Unsteadiness on feet: Secondary | ICD-10-CM

## 2017-09-29 DIAGNOSIS — R2689 Other abnormalities of gait and mobility: Secondary | ICD-10-CM | POA: Diagnosis not present

## 2017-09-29 DIAGNOSIS — M25622 Stiffness of left elbow, not elsewhere classified: Secondary | ICD-10-CM

## 2017-09-29 DIAGNOSIS — R293 Abnormal posture: Secondary | ICD-10-CM

## 2017-09-29 DIAGNOSIS — M6281 Muscle weakness (generalized): Secondary | ICD-10-CM

## 2017-09-29 DIAGNOSIS — M25611 Stiffness of right shoulder, not elsewhere classified: Secondary | ICD-10-CM

## 2017-09-29 DIAGNOSIS — R41841 Cognitive communication deficit: Secondary | ICD-10-CM

## 2017-09-29 DIAGNOSIS — M25652 Stiffness of left hip, not elsewhere classified: Secondary | ICD-10-CM

## 2017-09-29 DIAGNOSIS — M25612 Stiffness of left shoulder, not elsewhere classified: Secondary | ICD-10-CM

## 2017-09-29 DIAGNOSIS — M25651 Stiffness of right hip, not elsewhere classified: Secondary | ICD-10-CM

## 2017-09-29 DIAGNOSIS — R4701 Aphasia: Secondary | ICD-10-CM

## 2017-09-29 DIAGNOSIS — R41844 Frontal lobe and executive function deficit: Secondary | ICD-10-CM

## 2017-09-29 NOTE — Therapy (Signed)
[facility] [address] [place], [place], [address] Phone: [phone]   Fax:  [phone]  Speech Language Pathology Treatment  Patient Details  Name: Judy Spears MRN: [id number] Date of Birth: 11-20-1950 Referring Provider: Levada Schilling, MD   Encounter Date: 09/29/2017  End of Session - 09/29/17 1454    Visit Number  22    Number of Visits  24    Date for SLP Re-Evaluation  11/29/17    SLP Start Time  82    SLP Stop Time   1100    SLP Time Calculation (min)  40 min    Activity Tolerance  Patient tolerated treatment well       Past Medical History:  Diagnosis Date  . Arthritis    lt great toe  . Complication of anesthesia    pt has had headaches post op-did advise to hydra well preop  . Hair loss    right-sided  . History of exercise stress test    a. ETT (12/15) with 12:00 exercise, no ST changes, occasional PACs.   . Hyperlipidemia   . Insomnia   . Migraine headache   . Movement disorder    resltess in left legs  . Postmenopausal   . 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 Premature atrial contractions    a. Holter (12/15) with 8% PACs, no atrial runs or atrial fibrillation.     Past Surgical History:  Procedure Laterality Date  . BREAST BIOPSY  01/01/2011   Procedure: BREAST BIOPSY WITH NEEDLE LOCALIZATION;  Surgeon: Rolm Bookbinder, MD;  Location: [facility];  Service: General;  Laterality: Left;  left breast wire localization biopsy  . BREAST BIOPSY Right 07/27/2012  . BREAST BIOPSY Right 07/07/2016  . BREAST BIOPSY Left 06/30/2016  . cataracts right eye    . CESAREAN SECTION  1986  . HERNIA REPAIR  2000   RIH  . INTRAMEDULLARY (IM) NAIL INTERTROCHANTERIC Left 01/30/2017   Procedure: ORIF AFFIXUS NAIL;  Surgeon: Paralee Cancel, MD;  Location: [facility];  Service: Orthopedics;  Laterality: Left;  . IR REPLC GASTRO/COLONIC TUBE PERCUT W/FLUORO  09/29/2016  . opirectinal membrane  peel    . PEG PLACEMENT    . RHINOPLASTY  1983  . TRACHEOSTOMY    . TRACHEOSTOMY TUBE PLACEMENT N/A 12/15/2016   Procedure: TRACHEOSTOMY CHANGE;  Surgeon: [name] Montane, MD;  Location: [facility];  Service: ENT;  Laterality: N/A;    There were no vitals filed for this visit.  Subjective Assessment - 09/29/17 1432    Subjective  Pt enters with pharyngeal/laryngeal congestion today.    Patient is accompained by:  Family member   [name], and aide [name]   Currently in Pain?  No/denies            ADULT SLP TREATMENT - 09/29/17 1433      General Information   Behavior/Cognition  Alert;Confused;Cooperative;Pleasant mood;Impulsive   pt slid forward in chair- attempted to stand without help     Treatment Provided   Treatment provided  Cognitive-Linquistic      Cognitive-Linquistic Treatment   Treatment focused on  Dysarthria;Patient/family/caregiver education    Skilled Treatment  Pharyngeal/laryngeal congestion evident today which pt cleared with a good throat clear 1/4 possibilities. This persisted throughout therapy session. Spontaneous one word at a time production by pt approx 30% of the time. Pt initiated this strategy 50% at the initiation of session but decr'd frequency of this as session progressed. Pt's intelligibility using  this strategy was between 50 and 60%, with aide [name]) intelligibility close to 90%. Pt asked SLP question x2 today. Spontaneous utterances: voicing- 2-syllable 67%, 3 syllable 58%, 4 syllable 50%, 4+ syllable 25%. Intelligibility (functional): 2 syllable 42%, 3 syllable 33% ([name] 67%), 4 syllable 25% ([name] 75%), 4+ syllable 0% ([name] 60%). SLP cont'd to coach husband and caregiver on good and appropriate cueing for pt to attempt more intelligible speech and to clear hydrophonic voice by direct commenting on their cues. AGain, SLP engaged pt in specific targeting of bilabials in initial and final positions and alveolars in initial positions of words today Pt aide also was  observed to engage pt using this same technique today. At end of session pt husband told SLP pt would transition to [facility] once/week beginning next week and he will cancel remaining ST appointments at this facility.      Assessment / Recommendations / Plan   Plan  Discharge SLP treatment due to (comment)   pt beginning x1/week ST at [facility]     Dysphagia Recommendations   Diet recommendations  NPO   ice chips/water between meals after oral care     Progression Toward Goals   Progression toward goals  --   see goals      SLP Education - 09/29/17 1453    Education provided  Yes    Education Details  compensations for dysarthria (breathe, shout, move your mouth)    Person(s) Educated  Patient;Caregiver(s);Spouse    Methods  Explanation;Demonstration;Verbal cues    Comprehension  Verbalized understanding;Need further instruction;Returned demonstration;Verbal cues required       SLP Short Term Goals - 06/27/17 1750      SLP SHORT TERM GOAL #1   Title  Pt will demonstrate dysarthria HEP to increase articulatory coordination for speech with usual mod A over 3 sessions    Status  Deferred      SLP SHORT TERM GOAL #2   Title  Pt will achieve phonation adequate for SLP intelligibility in imitated two-syllable words using intelligibility compensations 12/20 trials with usual min A in 3 sessions    Status  Not Met      SLP SHORT TERM GOAL #3   Title  Pt will imitate 2-syllable words or phrases with functional intelligibility for SLP in 5/15 trials over 3 sessions     Status  Not Met      SLP SHORT TERM GOAL #4   Title  Pt will sustain attention to therapy tasks for 10 minutes with multimodal cues in 5 sessions    Status  Partially Met      SLP SHORT TERM GOAL #5   Title  pt family will demo understanding of oral care regimen, including safe ingestion of water/ice chips over three sessions    Status  Achieved      SLP SHORT TERM GOAL #6   Title  pt will demo dysphagia HEP to improve  management of secretions and safety with any possible POs    Status  Deferred      SLP SHORT TERM GOAL #7   Title  family/caregivers will demo understanding of pt swallowing precautions, for pt safety with POs, by verbal report     Status  Achieved       SLP Long Term Goals - 09/29/17 1458      SLP LONG TERM GOAL #1   Title  Pt will demo dysarthria  HEP to incr articulatory coordination for speech, with occasional mod A  over three sessions    Status  Not Met      SLP LONG TERM GOAL #2   Title  pt will achieve phonation adequate for SLP intelligibility in imitated 3-syllable words/3-word phrases using intelligibility compenastions 15/20 trials    Status  Not Met   and ongoing     SLP LONG TERM GOAL #3   Title  Pt will imitate 2-syllable words or phrases with functional intelligibility for SLP in 8/15 trials over three sessions    Status  Partially Met   1/3 sessions; goal will not continue     SLP LONG TERM GOAL #4   Title  pt will sustain attention to therapy tasks for 10 minutes with multimodal cues in 10 sessions    Status  Partially Met   7 sessions; goal will not continue     SLP LONG TERM GOAL #5   Title  pt will demo dysphagia HEP to improve management of secretions and safety with possible POs    Status  Deferred      SLP LONG TERM GOAL #6   Title  pt's family will demo appropriate cueing in each session for assisting pt with her deficits in speech/language/swallowing    Baseline  08-17-17, 08-26-17, 08-31-17    Time  2    Period  Weeks    Status  Partially Met   3 of 4 weeks; goal to continue as revised above     SLP LONG TERM GOAL #7   Title  pt's family, in order to demonstrate knowledge of necessities for pt's optimal pulmonary health, during each session will tell SLP the safety parameters with water protocol    Time  --   each    Period  --   session   Status  Deferred   not addressed in one session of maintenance therapy      Plan - 09/29/17 1454     Clinical Impression Statement  Patient presents with ongoing deficits in speech intelligibility (dysarthria, reduced breath support), cognitive communication and swallowing. Pt husband informed SLP that pt will be beginning ST at [facility] next week and so he will cancel remaining ST appointments and pt will be d/c'd. Pt was more effortful in her spontaneous conversation today although still very difficult to understand given persistent complications with respiratory and articulatory speech musculature. See skilled treatment for details of voicing and functional speech intelligilibty for spontaneous production of 2-4+ syllable words/phrases today. Pt will be d/c'd at this time due to receiving weekly (SLP ?s rehabilitative or maintenance therapy) as opposed to monthly therapy (for maintenance of abilities) at [facility].     Treatment/Interventions  [place] precaution training;Trials of upgraded texture/liquids;Compensatory strategies;Functional tasks;Patient/family education;Cueing hierarchy;Diet toleration management by SLP;Cognitive reorganization;Compensatory techniques;SLP instruction and feedback;Internal/external aids;Multimodal communcation approach;Pharyngeal strengthening exercises;Oral motor exercises    Potential to Achieve Goals  Fair    Potential Considerations  Ability to learn/carryover information;Co-morbidities;Severity of impairments;Cooperation/participation level;Previous level of function    Consulted and Agree with Plan of Care  Patient;Family member/caregiver    Family Member Consulted  Husband, [name]       Patient will benefit from skilled therapeutic intervention in order to improve the following deficits and impairments:   Dysarthria and anarthria  Aphasia  Dysphagia, oropharyngeal phase  Cognitive communication deficit   SPEECH THERAPY DISCHARGE SUMMARY  Visits from Start of Care: 22  Current functional level related to goals / functional outcomes: Pt partially met long  term goals. See goal summary above  for details. With pt's myriad of deficts, pt's deficit of speech intelligibility was targeted. The pt continues to exhibit severe dysarthria and occasionally speaks one syllable at-a-time responses which improve intelligibility mostly with shorter phrases.  Pt fatigue played a significant role in her participation in therapy. Often times SLP used parallel bars and held therapy in that setting to stimulate neural pathways by standing for 4-5 minutes before sitting down again. This strategy proved more successful at keeping pt awake and alert than having ST in the [place] room. Often times, however, pt would close her eyes during session and was somnolent for anywhere from <1 minute to the majority of her session. Generally, pt was in a more awakened state in the parallel bars. She more frequently used compensations for dysarthria than at the initiation of ST, however SLP believes pt never fully achieved a functional intelligibility level in any conversation during this course of ST. Pt also req'd consistent SLP (or family) cues to use compensations. If therapy would have continued it would have continued on a MAINTENANCE therapy level, not a rehabilitative therapy level. It should be noted that pt's ttherapies at this time are maintenance therapies and not rehabilitative therapies.     Remaining deficits: All deficits noted in evaluation persist.   Education / Equipment: Best ways to cue pt for incr'd articulation and thus improved speech intelligibility. Oral care needs to be performed prior to any PO intake, pt's 04-16-17 MBS showed pt needed to remain NPO with ice chips/sips water after thorough oral care.  Plan: Patient agrees to discharge.  Patient goals were partially met. Patient is being discharged due to the patient's request.  ?????       Problem List Patient Active Problem List   Diagnosis Date Noted  . Coagulase negative Staphylococcus bacteremia 02/09/2017   . Abdominal distension   . Chronic mucus hypersecretion, respiratory 12/31/2016  . Severe protein-calorie malnutrition [name]: less than 60% of standard weight) ([place]) 12/31/2016  . Acute urinary retention 12/31/2016  . Constipation 12/31/2016  . Shortness of breath   . Respiratory distress   . Pneumonia 12/05/2016  . Acute respiratory failure ([place]) 12/05/2016  . Pseudomonas urinary tract infection 10/27/2016  . Severe muscle deconditioning 09/30/2016  . Pressure injury of skin 09/28/2016  . Respiratory failure ([place])   . Tracheobronchitis   . Urinary retention   . PEG (percutaneous endoscopic gastrostomy) status ([place])   . Benign essential HTN   . Hyponatremia   . Hypokalemia   . Reactive hypertension   . Respiratory insufficiency 09/24/2016  . Malignant neoplasm of upper-inner quadrant of left breast in female, estrogen receptor positive ([place]) 07/14/2016  . Heterotopic ossification of bone 03/10/2016  . Acute on chronic respiratory failure with hypoxia ([place])   . Acute hypoxemic respiratory failure ([place])   . Acute blood loss anemia   . Atelectasis 02/04/2016  . Debility 02/03/2016  . Pneumonia of both lower lobes due to Pseudomonas species ([place])   . Pneumonia of both lower lobes due to methicillin susceptible Staphylococcus aureus (MSSA) ([place])   . Acute respiratory failure with hypoxia ([place])   . Acute encephalopathy   . Seizures ([place])   . Sepsis ([place])   . Hypoxia   . Pain   . Chronic respiratory failure ([place]) 12/28/2015  . Acute tracheobronchitis 12/24/2015  . Tracheostomy tube present ([place])   . Tracheal stenosis   . Chronic respiratory failure with hypoxia ([place])   . 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 Tracheostomy status ([place])  09/26/2015  . Allergic rhinitis 09/26/2015  . 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 Movement disorder 09/22/2015  . Encephalitis 09/22/2015  . Chest pain 02/07/2014  . Premature atrial contractions 01/13/2014  . Abnormality of  gait 12/04/2013  . Hyperlipidemia 12/21/2011  . Cardiovascular risk factor 12/21/2011  . Bunion 01/31/2008  . Metatarsalgia of both feet 09/20/2007  . FLAT FOOT 09/20/2007  . HALLUX RIGIDUS, ACQUIRED 09/20/2007    [facility] ,[place], CCC-SLP  09/29/2017, 10:13 PM  [facility] [address] [place], [place], [address] Phone: [phone]   Fax:  [phone]   Name: Judy Spears MRN: [id number] Date of Birth: 01/17/1951

## 2017-09-29 NOTE — Therapy (Signed)
[facility] [address] [place], [place], [address] Phone: [phone]   Fax:  [phone]  Occupational Therapy Treatment  Patient Details  Name: Judy Spears MRN: [id number] Date of Birth: Apr 18, 1950 Referring Provider: Dr. Levada Schilling   Encounter Date: 09/29/2017  OT End of Session - 09/29/17 1217    Visit Number  21    Number of Visits  69    Date for OT Re-Evaluation  12/20/17    Authorization Type  medicare maintenance program - pt to be reassessed every 30 days per Medicare guidelines  Will need PN every 10th visit    Authorization Time Period  90 days - 12/20/2017    Authorization - Visit Number  21    Authorization - Number of Visits  83    OT Start Time  0940   pt arrived late   OT Stop Time  1015    OT Time Calculation (min)  35 min    Activity Tolerance  Patient tolerated treatment well       Past Medical History:  Diagnosis Date  . Arthritis    lt great toe  . Complication of anesthesia    pt has had headaches post op-did advise to hydra well preop  . Hair loss    right-sided  . History of exercise stress test    a. ETT (12/15) with 12:00 exercise, no ST changes, occasional PACs.   . Hyperlipidemia   . Insomnia   . Migraine headache   . Movement disorder    resltess in left legs  . Postmenopausal   . 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 Premature atrial contractions    a. Holter (12/15) with 8% PACs, no atrial runs or atrial fibrillation.     Past Surgical History:  Procedure Laterality Date  . BREAST BIOPSY  01/01/2011   Procedure: BREAST BIOPSY WITH NEEDLE LOCALIZATION;  Surgeon: Rolm Bookbinder, MD;  Location: [facility];  Service: General;  Laterality: Left;  left breast wire localization biopsy  . BREAST BIOPSY Right 07/27/2012  . BREAST BIOPSY Right 07/07/2016  . BREAST BIOPSY Left 06/30/2016  . cataracts right eye    . CESAREAN SECTION  1986  . HERNIA REPAIR   2000   RIH  . INTRAMEDULLARY (IM) NAIL INTERTROCHANTERIC Left 01/30/2017   Procedure: ORIF AFFIXUS NAIL;  Surgeon: Paralee Cancel, MD;  Location: [facility];  Service: Orthopedics;  Laterality: Left;  . IR REPLC GASTRO/COLONIC TUBE PERCUT W/FLUORO  09/29/2016  . opirectinal membrane peel    . PEG PLACEMENT    . RHINOPLASTY  1983  . TRACHEOSTOMY    . TRACHEOSTOMY TUBE PLACEMENT N/A 12/15/2016   Procedure: TRACHEOSTOMY CHANGE;  Surgeon: [name] Montane, MD;  Location: [facility];  Service: ENT;  Laterality: N/A;    There were no vitals filed for this visit.  Subjective Assessment - 09/29/17 0941    Subjective   Pt moaning off and in session but denied pain    Patient is accompained by:  Family member   husband [name], caregiver [name]   Pertinent History  see epic snapshot - ABI/encephalitis/hypoxia from virus; recent hospitalization due to MRSA PNA     Patient Stated Goals  Pt unable to state.     Currently in Pain?  No/denies                   OT Treatments/Exercises (OP) - 09/29/17 0001      Neurological Re-education Exercises  Other Exercises 1  Neuro re ed to address spasticity, postural alignment, postural control in sitting and standing, Pt with severe spasticity today in RLE as well as throughout entire body. Caregiver reported that it was much harder to work with pt this mornng to get her ready for the day.  Treatment session focused on neuromuscular techniques to reduce spasticity and improve alignment (weight bearing, forward weigh translation with rotation in sitting.  lower trunk initiated flexion/rotation in supine, upper trunk initiated lateral flexion with lower trunk stabilization).  PT reported one hour later that pt's tone was reduced and pt able to work on ambulation outside of parellel bars today.  See PT note.                  OT Long Term Goals - 09/29/17 1214      OT LONG TERM GOAL #1   Title  Pt will maintain ability to roll in bed during self care tasks  with no more than max a x1 - reasssess 10/25/2017    Baseline  09/27/2017  pt able to maintain rolling with max a x1     Status  On-going   09/27/2017 pt able to roll with max a x1     OT LONG TERM GOAL #2   Title  Pt will maintain the ability to use roll in shower chair with care giver assistance     Baseline  09/27/2017 pt continues to demonsrate adequate trunk control, sititng balance and decreased tone to use shower chair with care giver assistance.    Status  On-going      OT LONG TERM GOAL #3   Title  Pt will maintain ability to transfer to toilet with no more than max a x1 using Steady Assist - 10/06/2017    Baseline  09/27/2017 pt transfers with steady assist to toilet with max a x1    Status  On-going      OT LONG TERM GOAL #4   Title  Pt wil maintain ability for dressing at max a x1.     Baseline  09/27/2017  pt able to be assisted with dressing by 1 person only at max a level    Status  On-going      OT LONG TERM GOAL #5   Title  Pt will maintain ability to stand with max a x1 to for clothing mgmt with toileting.     Baseline  09/27/2017  pt able to stand with max a x 1 during toileting tasks    Status  On-going      OT LONG TERM GOAL #6   Title  Pt will maintain ability to participate in functional transfers, as well as stand to sit and sit to stand with no more than max a x1    Status  On-going   09/27/2017  pt able ot transfer and complete sit to stand with max a x1     OT LONG TERM GOAL #7   Title  Pt will maintain functional mobility and trunk control to aid in her ability to clear sucretions and assist with phonation to express basic needs/wants   09/27/2017 pt has remained free of respiratory issues and intermittently improved phonation   Status  On-going            Plan - 09/29/17 1214    Clinical Impression Statement  Pt with significantly increased tone again today however responded well to treatment interventions to address this.  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    Occupational performance deficits (Please refer to evaluation for details):  ADL's;IADL's;Rest and Sleep;Leisure;Social Participation;Work    Neurosurgeon    Current Impairments/barriers affecting progress:  severity of multi system deficits, intermittent medical issues    OT Frequency  2x / week    OT Duration  12 weeks    OT Treatment/Interventions  Self-care/ADL training;Ultrasound;Moist Heat;Electrical Stimulation;DME and/or AE instruction;Neuromuscular education;Therapeutic exercise;Functional Mobility Training;Manual Therapy;Passive range of motion;Splinting;Therapeutic activities;Balance training;Patient/family education;Visual/perceptual remediation/compensation;Cognitive remediation/compensation    Plan  address rigidity and tone in trunk and extremeties, NMR trunk and LE's for transitional movements, static standing, sitting balance, sit to stand, stand to sit, stepping;     Consulted and Agree with Plan of Care  Patient;Family member/caregiver    Family Member Consulted  caregiver [name], husband [name]       Patient will benefit from skilled therapeutic intervention in order to improve the following deficits and impairments:  Decreased endurance, Decreased skin integrity, Impaired vision/preception, Improper body mechanics, Impaired perceived functional ability, Decreased knowledge of precautions, Cardiopulmonary status limiting activity, Decreased activity tolerance, Decreased knowledge of use of DME, Decreased strength, Impaired flexibility, Improper spinal/pelvic alignment, Impaired sensation, Difficulty walking, Decreased mobility, Decreased balance, Decreased cognition, Decreased range of motion, Impaired tone, Impaired UE functional use, Decreased  safety awareness, Decreased coordination, Pain, Abnormal gait  Visit Diagnosis: Muscle weakness (generalized)  Abnormal posture  Other symptoms and signs involving the nervous system  Unsteadiness on feet  Frontal lobe and executive function deficit  Stiffness of right shoulder, not elsewhere classified  Stiffness of left shoulder, not elsewhere classified  Stiffness of left elbow, not elsewhere classified    Problem List Patient Active Problem List   Diagnosis Date Noted  . Coagulase negative Staphylococcus bacteremia 02/09/2017  . Abdominal distension   . Chronic mucus hypersecretion, respiratory 12/31/2016  . Severe protein-calorie malnutrition [name]: less than 60% of standard weight) ([facility]) 12/31/2016  . Acute urinary retention 12/31/2016  . Constipation 12/31/2016  . Shortness of breath   . Respiratory distress   . Pneumonia 12/05/2016  . Acute respiratory failure ([facility]) 12/05/2016  . Pseudomonas urinary tract infection 10/27/2016  . Severe muscle deconditioning 09/30/2016  . Pressure injury of skin 09/28/2016  . Respiratory failure ([facility])   . Tracheobronchitis   . Urinary retention   . PEG (percutaneous endoscopic gastrostomy) status ([facility])   . Benign essential HTN   . Hyponatremia   . Hypokalemia   . Reactive hypertension   . Respiratory insufficiency 09/24/2016  . Malignant neoplasm of upper-inner quadrant of left breast in female, estrogen receptor positive ([facility]) 07/14/2016  . Heterotopic ossification of bone 03/10/2016  . Acute on chronic respiratory failure with hypoxia ([facility])   . Acute hypoxemic respiratory failure ([facility])   . Acute blood loss anemia   . Atelectasis 02/04/2016  . Debility 02/03/2016  . Pneumonia of both lower lobes due to Pseudomonas species ([facility])   . Pneumonia of both lower lobes due to methicillin susceptible Staphylococcus aureus (MSSA) ([facility])   . Acute respiratory failure with hypoxia ([facility])   . Acute encephalopathy   . Seizures ([facility])    . Sepsis ([facility] [place])   . Hypoxia   . Pain   . Chronic respiratory failure ([facility]) 12/28/2015  . Acute tracheobronchitis 12/24/2015  .  Tracheostomy tube present ([facility])   . Tracheal stenosis   . Chronic respiratory failure with hypoxia ([facility])   . Spastic tetraplegia ([facility]) 10/20/2015  . Tracheostomy status ([facility]) 09/26/2015  . Allergic rhinitis 09/26/2015  . 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 Movement disorder 09/22/2015  . Encephalitis 09/22/2015  . Chest pain 02/07/2014  . Premature atrial contractions 01/13/2014  . Abnormality of gait 12/04/2013  . Hyperlipidemia 12/21/2011  . Cardiovascular risk factor 12/21/2011  . Bunion 01/31/2008  . Metatarsalgia of both feet 09/20/2007  . FLAT FOOT 09/20/2007  . HALLUX RIGIDUS, ACQUIRED 09/20/2007    Quay Burow, OTR/L 09/29/2017, 12:20 PM  [facility] [address] [place], [place], [address] Phone: [phone]   Fax:  [phone]  Name: Judy Spears MRN: [id number] Date of Birth: [birth year]

## 2017-09-29 NOTE — Therapy (Signed)
[facility] [address] [place], [place], [address] Phone: [phone]   Fax:  [phone]  Physical Therapy Treatment  Patient Details  Name: Judy Spears MRN: [id number] Date of Birth: 06-09-1950 Referring Provider: Dr. Levada Schilling   Encounter Date: 09/29/2017  PT End of Session - 09/29/17 1507    Visit Number  25    Number of Visits  28    Date for PT Re-Evaluation  10/04/17    Authorization Type  [employer] Medicare    Authorization Time Period  07/12/17-10/04/17    PT Start Time  1103    PT Stop Time  1147    PT Time Calculation (min)  44 min    Activity Tolerance  Patient tolerated treatment well    Behavior During Therapy  [facility] for tasks assessed/performed       Past Medical History:  Diagnosis Date  . Arthritis    lt great toe  . Complication of anesthesia    pt has had headaches post op-did advise to hydra well preop  . Hair loss    right-sided  . History of exercise stress test    a. ETT (12/15) with 12:00 exercise, no ST changes, occasional PACs.   . Hyperlipidemia   . Insomnia   . Migraine headache   . Movement disorder    resltess in left legs  . Postmenopausal   . 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 Premature atrial contractions    a. Holter (12/15) with 8% PACs, no atrial runs or atrial fibrillation.     Past Surgical History:  Procedure Laterality Date  . BREAST BIOPSY  01/01/2011   Procedure: BREAST BIOPSY WITH NEEDLE LOCALIZATION;  Surgeon: Rolm Bookbinder, MD;  Location: [facility];  Service: General;  Laterality: Left;  left breast wire localization biopsy  . BREAST BIOPSY Right 07/27/2012  . BREAST BIOPSY Right 07/07/2016  . BREAST BIOPSY Left 06/30/2016  . cataracts right eye    . CESAREAN SECTION  1986  . HERNIA REPAIR  2000   RIH  . INTRAMEDULLARY (IM) NAIL INTERTROCHANTERIC Left 01/30/2017   Procedure: ORIF AFFIXUS NAIL;  Surgeon: Paralee Cancel, MD;  Location: [facility];  Service: Orthopedics;  Laterality: Left;  . IR REPLC GASTRO/COLONIC TUBE PERCUT W/FLUORO  09/29/2016  . opirectinal membrane peel    . PEG PLACEMENT    . RHINOPLASTY  1983  . TRACHEOSTOMY    . TRACHEOSTOMY TUBE PLACEMENT N/A 12/15/2016   Procedure: TRACHEOSTOMY CHANGE;  Surgeon: [name] Montane, MD;  Location: [facility];  Service: ENT;  Laterality: N/A;    There were no vitals filed for this visit.  Subjective Assessment - 09/29/17 1506    Subjective  Pt arrvives today with husband and caregiver.  Report no changes since last visit.      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  No/denies            Ortho fit/train:  Gerald Stabs present during session (had already delivered B spry step AFOs) to better assess any shoe modifications needed.  Prior to [place] getting to session, PT educating on why softer shoes not recommended with use of AFOs due to needing increased stabilization and also deeper shoe to allow foot to sit in brace  as much as possible.  Pt and spouse verbalized understanding and decided on use of new balance shoe during session.  Performed two bouts of standing with lateral weight shifts to continue to address need forward trunk lean and weight shift.  By end of session, pt better able to lean forward prior to stand to better initiate transfer.  Once in standing, provided facilitation and verbal cues for increased R lateral trunk lean and L weight shift in hips. Note increased tone in R hip, therefore in standing and in gait, appears that she has leg length discrepancy but, again likely due to tone.  Pt doing better during PT session to initiate lateral weight shift in hips, however continues to be very limited in trunk.  Performed two bouts of gait (while Gerald Stabs present) x 35' and 20' with +2 (max and min A) with PT  providing support under L axilla for improved R lateral trunk lean and counter force at R pelvis for L lateral weight shift, esp during L stance.  Feel that AFOs will work well for gait, standing, transfers and to reduce extensor tone, and Gerald Stabs wanting to add B toe caps and leather portion to inside of L heel to avoid catching due to adductor tone in LLE.  Pt, spouse, caregiver verbalized understanding.                    PT Education - 09/29/17 1507    Education Details  modifications made to tennis shoes    Person(s) Educated  Patient;Spouse;Caregiver(s)    Methods  Explanation    Comprehension  Verbalized understanding       PT Short Term Goals - 06/10/17 1305      PT SHORT TERM GOAL #1   Title  Perform sit to/from stand from wheelchair or mat with +1 mod assist.    Baseline  06/09/16: met today    Status  Achieved      PT SHORT TERM GOAL #2   Title  Pt will perform sit to stand from wheelchair to RW with max assist for pre-gait skill.    Baseline  06/09/17: met today    Status  Achieved      PT SHORT TERM GOAL #3   Title  Pt will transfer supine to sit with max assist.    Baseline  06/09/17: met today, needs increased time to assist    Status  Achieved      PT SHORT TERM GOAL #4   Title  Pt will sit with RUE support with min to mod assist unsupported on side of mat for at least 3" to demo improved trunk strength and core stabilization.    Baseline  06/09/17: met today     Status  Achieved      PT SHORT TERM GOAL #5   Title  Pt will amb. 15' with appropriate assistive device with max assist +1    Baseline  06/09/17: pt now needs mod/max assist of 2 people for gait using modified 3 muskateer assist and blue rocker AFO to right LE    Status  Partially Met        PT Long Term Goals - 09/29/17 1512      PT LONG TERM GOAL #6   Title  Pt will be able to return to participation in therapeutic horseback riding at [facility] for ongoing exercise program. (NEW  MAINTAINANCE GOALS:  Reassess 8/13 and 9/10)      Baseline  Not returned as of  09/29/17 - pt has had bil. hip surgeries    Time  4    Period  Weeks    Status  On-going      PT LONG TERM GOAL #7   Title  Pt will maintain ability to transfer (stand pivot) from varying seated surfaces at max A level to decrease burden of care to single caregiver.     Baseline  mod to max A (variable) on 09/29/17    Time  4    Period  Weeks    Status  On-going      PT LONG TERM GOAL #8   Title  Pt will maintain sitting balance with RUE support at min A level x 5 minutes in order to maintain postural and trunk control without external support of w/c in order to indicate improved postural alignment.      Baseline  Pt able to sit at S to min A level with RUE support x 5 mins, requires increased support if RUE not able to support on mat 09/29/17    Time  4    Period  Weeks    Status  On-going      PT LONG TERM GOAL  #9   TITLE  Pt will maintain ability to ambulate with +2 max A for 20' in order to faciliate weight bearing to maintain bone density, decrease spasticity, and decrease the progression of contractures.     Baseline  Pt able to ambulate x 25' with +2 max A 09/29/17    Time  4    Period  Weeks    Status  On-going      PT LONG TERM GOAL  #10   TITLE  Pt will maintain ability to stand with RUE support x 5 mins at min A level for LE stretching, for improved bowel, bladder and respiratory function.      Baseline  would like to assess this goal in // bars as she has solid UE support in this location and can simulate at home with stedy     Time  4    Period  Weeks    Status  On-going            Plan - 09/29/17 1509    Clinical Impression Statement  Gerald Stabs present from [facility] today with B AFO delivery (spry step AFOs).  Performed 2 bouts of gait along with standing exercises during session to ensure proper fit.  Note that he recommends B toe caps (R shoe will have more medial cap due to ER) and also  leather to medial heel portion of L shoe to avoid tread catching due to adductor tone.  Per OTs note/speaking with this PT, pt VERY tight during her session, therefore did exercise to reduce tone.  Did note marked improvement during PT session, esp with static stance  due to tone reduction exercises in OT session.     Rehab Potential  Fair    Clinical Impairments Affecting Rehab Potential  severity of deficits with previous extensive therapies    PT Frequency  1x / week    PT Duration  12 weeks    PT Treatment/Interventions  ADLs/Self Care Home Management;Therapeutic exercise;Therapeutic activities;Functional mobility training;Gait training;Balance training;Neuromuscular re-education;Patient/family education;Orthotic Fit/Training;Passive range of motion    PT Next Visit Plan  forward weight shift/trunk lean, tone reduction, standing balance, gait with B spry step PLS AFOs and shoe covers!    PT Home Exercise Plan  caregiver and husband have previously been instructed  in ROM, sitting balance, and transfer training    Consulted and Agree with Plan of Care  Patient;Family member/caregiver       Patient will benefit from skilled therapeutic intervention in order to improve the following deficits and impairments:  Abnormal gait, Decreased activity tolerance, Decreased balance, Decreased cognition, Decreased coordination, Decreased mobility, Decreased range of motion, Decreased endurance, Hypomobility, Decreased strength, Impaired flexibility, Increased muscle spasms, Impaired UE functional use, Postural dysfunction, Pain  Visit Diagnosis: Other abnormalities of gait and mobility  Muscle weakness (generalized)  Abnormal posture  Other symptoms and signs involving the nervous system  Unsteadiness on feet  Stiffness of left hip, not elsewhere classified  Stiffness of right hip, not elsewhere classified     Problem List Patient Active Problem List   Diagnosis Date Noted  . Coagulase  negative Staphylococcus bacteremia 02/09/2017  . Abdominal distension   . Chronic mucus hypersecretion, respiratory 12/31/2016  . Severe protein-calorie malnutrition [name]: less than 60% of standard weight) ([place]) 12/31/2016  . Acute urinary retention 12/31/2016  . Constipation 12/31/2016  . Shortness of breath   . Respiratory distress   . Pneumonia 12/05/2016  . Acute respiratory failure ([place]) 12/05/2016  . Pseudomonas urinary tract infection 10/27/2016  . Severe muscle deconditioning 09/30/2016  . Pressure injury of skin 09/28/2016  . Respiratory failure ([place])   . Tracheobronchitis   . Urinary retention   . PEG (percutaneous endoscopic gastrostomy) status ([place])   . Benign essential HTN   . Hyponatremia   . Hypokalemia   . Reactive hypertension   . Respiratory insufficiency 09/24/2016  . Malignant neoplasm of upper-inner quadrant of left breast in female, estrogen receptor positive ([place]) 07/14/2016  . Heterotopic ossification of bone 03/10/2016  . Acute on chronic respiratory failure with hypoxia ([place])   . Acute hypoxemic respiratory failure ([place])   . Acute blood loss anemia   . Atelectasis 02/04/2016  . Debility 02/03/2016  . Pneumonia of both lower lobes due to Pseudomonas species ([place])   . Pneumonia of both lower lobes due to methicillin susceptible Staphylococcus aureus (MSSA) ([place])   . Acute respiratory failure with hypoxia ([place])   . Acute encephalopathy   . Seizures ([place])   . Sepsis ([place])   . Hypoxia   . Pain   . Chronic respiratory failure ([place]) 12/28/2015  . Acute tracheobronchitis 12/24/2015  . Tracheostomy tube present ([place])   . Tracheal stenosis   . Chronic respiratory failure with hypoxia ([place])   . Spastic tetraplegia ([place]) 10/20/2015  . Tracheostomy status ([place]) 09/26/2015  . Allergic rhinitis 09/26/2015  . 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 Movement disorder 09/22/2015  . Encephalitis 09/22/2015  . Chest pain 02/07/2014  . Premature  atrial contractions 01/13/2014  . Abnormality of gait 12/04/2013  . Hyperlipidemia 12/21/2011  . Cardiovascular risk factor 12/21/2011  . Bunion 01/31/2008  . Metatarsalgia of both feet 09/20/2007  . FLAT FOOT 09/20/2007  . HALLUX RIGIDUS, ACQUIRED 09/20/2007    Cameron Sprang, PT, MPT [facility] [address] [place], [place], [address] Phone: [phone]   Fax:  [phone] 09/29/17, 3:23 PM  Name: Judy Spears MRN: [id number] Date of Birth: 02-13-1950

## 2017-10-04 ENCOUNTER — Ambulatory Visit: Payer: Medicare Other | Admitting: Occupational Therapy

## 2017-10-04 ENCOUNTER — Encounter: Payer: Self-pay | Admitting: Occupational Therapy

## 2017-10-04 ENCOUNTER — Ambulatory Visit: Payer: Medicare Other | Admitting: Physical Therapy

## 2017-10-04 DIAGNOSIS — M6281 Muscle weakness (generalized): Secondary | ICD-10-CM

## 2017-10-04 DIAGNOSIS — R2681 Unsteadiness on feet: Secondary | ICD-10-CM

## 2017-10-04 DIAGNOSIS — R29818 Other symptoms and signs involving the nervous system: Secondary | ICD-10-CM

## 2017-10-04 DIAGNOSIS — M25611 Stiffness of right shoulder, not elsewhere classified: Secondary | ICD-10-CM

## 2017-10-04 DIAGNOSIS — R41844 Frontal lobe and executive function deficit: Secondary | ICD-10-CM

## 2017-10-04 DIAGNOSIS — R2689 Other abnormalities of gait and mobility: Secondary | ICD-10-CM | POA: Diagnosis not present

## 2017-10-04 DIAGNOSIS — R293 Abnormal posture: Secondary | ICD-10-CM

## 2017-10-04 DIAGNOSIS — M25612 Stiffness of left shoulder, not elsewhere classified: Secondary | ICD-10-CM

## 2017-10-04 DIAGNOSIS — M25622 Stiffness of left elbow, not elsewhere classified: Secondary | ICD-10-CM

## 2017-10-04 NOTE — Therapy (Signed)
[facility] [address] [place], [place], [address] Phone: [phone]   Fax:  [phone]  Occupational Therapy Treatment  Patient Details  Name: Judy Spears MRN: [id number] Date of Birth: July 07, 1950 Referring Provider: Dr. Levada Schilling   Encounter Date: 10/04/2017  OT End of Session - 10/04/17 1643    Visit Number  22    Number of Visits  29    Date for OT Re-Evaluation  12/20/17    Authorization Type  medicare maintenance program - pt to be reassessed every 30 days per Medicare guidelines  Will need PN every 10th visit    Authorization Time Period  90 days - 12/20/2017    Authorization - Visit Number  72    Authorization - Number of Visits  30    OT Start Time  0623    OT Stop Time  1445    OT Time Calculation (min)  40 min    Activity Tolerance  Patient tolerated treatment well    Behavior During Therapy  [facility] for tasks assessed/performed       Past Medical History:  Diagnosis Date  . Arthritis    lt great toe  . Complication of anesthesia    pt has had headaches post op-did advise to hydra well preop  . Hair loss    right-sided  . History of exercise stress test    a. ETT (12/15) with 12:00 exercise, no ST changes, occasional PACs.   . Hyperlipidemia   . Insomnia   . Migraine headache   . Movement disorder    resltess in left legs  . Postmenopausal   . 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 Premature atrial contractions    a. Holter (12/15) with 8% PACs, no atrial runs or atrial fibrillation.     Past Surgical History:  Procedure Laterality Date  . BREAST BIOPSY  01/01/2011   Procedure: BREAST BIOPSY WITH NEEDLE LOCALIZATION;  Surgeon: Rolm Bookbinder, MD;  Location: [facility];  Service: General;  Laterality: Left;  left breast wire localization biopsy  . BREAST BIOPSY Right 07/27/2012  . BREAST BIOPSY Right 07/07/2016  . BREAST BIOPSY Left 06/30/2016  . cataracts right eye     . CESAREAN SECTION  1986  . HERNIA REPAIR  2000   RIH  . INTRAMEDULLARY (IM) NAIL INTERTROCHANTERIC Left 01/30/2017   Procedure: ORIF AFFIXUS NAIL;  Surgeon: Paralee Cancel, MD;  Location: [facility];  Service: Orthopedics;  Laterality: Left;  . IR REPLC GASTRO/COLONIC TUBE PERCUT W/FLUORO  09/29/2016  . opirectinal membrane peel    . PEG PLACEMENT    . RHINOPLASTY  1983  . TRACHEOSTOMY    . TRACHEOSTOMY TUBE PLACEMENT N/A 12/15/2016   Procedure: TRACHEOSTOMY CHANGE;  Surgeon: [name] Montane, MD;  Location: [facility];  Service: ENT;  Laterality: N/A;    There were no vitals filed for this visit.  Subjective Assessment - 10/04/17 1638    Subjective   Patient's husband and caregiver indicate that yesterday Baclofen dosage was increased to double dosage yesterday.      Patient is accompained by:  Family member    Pertinent History  see epic snapshot - ABI/encephalitis/hypoxia from virus; recent hospitalization due to MRSA PNA     Patient Stated Goals  Pt unable to state.     Currently in Pain?  No/denies    Pain Score  0-No pain  OT Treatments/Exercises (OP) - 10/04/17 0001      Neurological Re-education Exercises   Other Exercises 1  Neuromuscular reeducation to address sit to stand transitions, and standing weight shifts that patient and caregiver [name] can do at home. Patient ahs sit to stand lift, and if turned around can use this as a solid surface to hold with standing.  With UE support - able to stand and weight shift to midline from far right without further physical assistance.  Mod assist to safely transition to stand, then no additional physical assistance but very close supervision and frequent verbal cueing, and visual cueing to utilize mirror for feedback to improve midline orientation.  At tend of session as we were transitioning to PT session - patient with cough - had difficulty clearing secretions - difficulty voicing with secretions.  O2 saturation tested @  95%.  Patient given seated rest for 1 minute until cough subsided.               OT Education - 10/04/17 1642    Education provided  Yes    Education Details  education for patient and caregiver regarding use of sit to stand lift for standing weight shifts in home.      Person(s) Educated  Patient;Caregiver(s)    Methods  Explanation;Demonstration;Tactile cues;Verbal cues    Comprehension  Verbalized understanding;Returned demonstration       OT Short Term Goals - 07/14/17 1433      OT SHORT TERM GOAL #1   Title  --    Status  --      OT SHORT TERM GOAL #2   Title  --    Status  --      OT SHORT TERM GOAL #6   Title  --      OT SHORT TERM GOAL #7   Title  --        OT Long Term Goals - 09/29/17 1214      OT LONG TERM GOAL #1   Title  Pt will maintain ability to roll in bed during self care tasks with no more than max a x1 - reasssess 10/25/2017    Baseline  09/27/2017  pt able to maintain rolling with max a x1     Status  On-going   09/27/2017 pt able to roll with max a x1     OT LONG TERM GOAL #2   Title  Pt will maintain the ability to use roll in shower chair with care giver assistance     Baseline  09/27/2017 pt continues to demonsrate adequate trunk control, sititng balance and decreased tone to use shower chair with care giver assistance.    Status  On-going      OT LONG TERM GOAL #3   Title  Pt will maintain ability to transfer to toilet with no more than max a x1 using Steady Assist - 10/06/2017    Baseline  09/27/2017 pt transfers with steady assist to toilet with max a x1    Status  On-going      OT LONG TERM GOAL #4   Title  Pt wil maintain ability for dressing at max a x1.     Baseline  09/27/2017  pt able to be assisted with dressing by 1 person only at max a level    Status  On-going      OT LONG TERM GOAL #5   Title  Pt will maintain ability to stand with max a x1 to for  clothing mgmt with toileting.     Baseline  09/27/2017  pt able to stand with max a  x 1 during toileting tasks    Status  On-going      OT LONG TERM GOAL #6   Title  Pt will maintain ability to participate in functional transfers, as well as stand to sit and sit to stand with no more than max a x1    Status  On-going   09/27/2017  pt able ot transfer and complete sit to stand with max a x1     OT LONG TERM GOAL #7   Title  Pt will maintain functional mobility and trunk control to aid in her ability to clear sucretions and assist with phonation to express basic needs/wants   09/27/2017 pt has remained free of respiratory issues and intermittently improved phonation   Status  On-going            Plan - 10/04/17 1643    Clinical Impression Statement  Patient has had a asignificant medication adjustment to help manage rigidity.  Medication change occured 9.9.    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    Occupational performance deficits (Please refer to evaluation for details):  ADL's;IADL's;Rest and Sleep;Leisure;Social Participation;Work    Neurosurgeon    Current Impairments/barriers affecting progress:  severity of multi system deficits, intermittent medical issues    OT Frequency  2x / week    OT Duration  12 weeks    OT Treatment/Interventions  Self-care/ADL training;Ultrasound;Moist Heat;Electrical Stimulation;DME and/or AE instruction;Neuromuscular education;Therapeutic exercise;Functional Mobility Training;Manual Therapy;Passive range of motion;Splinting;Therapeutic activities;Balance training;Patient/family education;Visual/perceptual remediation/compensation;Cognitive remediation/compensation    Plan  address rigidity and tone in trunk and extremeties, NMR trunk and LE's for transitional movements, static standing, sitting balance, sit to  stand, stand to sit, stepping;     Consulted and Agree with Plan of Care  Patient;Family member/caregiver    Family Member Consulted  caregiver [name], husband [name]       Patient will benefit from skilled therapeutic intervention in order to improve the following deficits and impairments:  Decreased endurance, Decreased skin integrity, Impaired vision/preception, Improper body mechanics, Impaired perceived functional ability, Decreased knowledge of precautions, Cardiopulmonary status limiting activity, Decreased activity tolerance, Decreased knowledge of use of DME, Decreased strength, Impaired flexibility, Improper spinal/pelvic alignment, Impaired sensation, Difficulty walking, Decreased mobility, Decreased balance, Decreased cognition, Decreased range of motion, Impaired tone, Impaired UE functional use, Decreased safety awareness, Decreased coordination, Pain, Abnormal gait  Visit Diagnosis: Muscle weakness (generalized)  Abnormal posture  Other symptoms and signs involving the nervous system  Unsteadiness on feet  Frontal lobe and executive function deficit  Stiffness of right shoulder, not elsewhere classified  Stiffness of left shoulder, not elsewhere classified  Stiffness of left elbow, not elsewhere classified    Problem List Patient Active Problem List   Diagnosis Date Noted  . Coagulase negative Staphylococcus bacteremia 02/09/2017  . Abdominal distension   . Chronic mucus hypersecretion, respiratory 12/31/2016  . Severe protein-calorie malnutrition [name]: less than 60% of standard weight) ([place]) 12/31/2016  . Acute urinary retention 12/31/2016  . Constipation 12/31/2016  . Shortness of breath   . Respiratory distress   . Pneumonia 12/05/2016  . Acute respiratory failure ([place]) 12/05/2016  . Pseudomonas urinary tract infection 10/27/2016  . Severe muscle deconditioning  09/30/2016  . Pressure injury of skin 09/28/2016  . Respiratory failure ([place])   .  Tracheobronchitis   . Urinary retention   . PEG (percutaneous endoscopic gastrostomy) status ([place])   . Benign essential HTN   . Hyponatremia   . Hypokalemia   . Reactive hypertension   . Respiratory insufficiency 09/24/2016  . Malignant neoplasm of upper-inner quadrant of left breast in female, estrogen receptor positive ([place]) 07/14/2016  . Heterotopic ossification of bone 03/10/2016  . Acute on chronic respiratory failure with hypoxia ([place])   . Acute hypoxemic respiratory failure ([place])   . Acute blood loss anemia   . Atelectasis 02/04/2016  . Debility 02/03/2016  . Pneumonia of both lower lobes due to Pseudomonas species ([place])   . Pneumonia of both lower lobes due to methicillin susceptible Staphylococcus aureus (MSSA) ([place])   . Acute respiratory failure with hypoxia ([place])   . Acute encephalopathy   . Seizures ([place])   . Sepsis ([place])   . Hypoxia   . Pain   . Chronic respiratory failure ([place]) 12/28/2015  . Acute tracheobronchitis 12/24/2015  . Tracheostomy tube present ([place])   . Tracheal stenosis   . Chronic respiratory failure with hypoxia ([place])   . 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 Tracheostomy status ([place]) 09/26/2015  . Allergic rhinitis 09/26/2015  . 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 Movement disorder 09/22/2015  . Encephalitis 09/22/2015  . Chest pain 02/07/2014  . Premature atrial contractions 01/13/2014  . Abnormality of gait 12/04/2013  . Hyperlipidemia 12/21/2011  . Cardiovascular risk factor 12/21/2011  . Bunion 01/31/2008  . Metatarsalgia of both feet 09/20/2007  . FLAT FOOT 09/20/2007  . HALLUX RIGIDUS, ACQUIRED 09/20/2007    Mariah Milling 10/04/2017, 4:45 PM  [facility] [address] [place], [place], [address] Phone: [phone]   Fax:  [phone]  Name: Judy Spears MRN: [id number] Date of Birth: Nov 06, 1950

## 2017-10-05 ENCOUNTER — Encounter: Payer: Self-pay | Admitting: Physical Therapy

## 2017-10-05 NOTE — Therapy (Signed)
[facility] [address] [place], [place], [address] Phone: [phone]   Fax:  [phone]  Physical Therapy Treatment  Patient Details  Name: Judy Spears MRN: [id number] Date of Birth: 10-Sep-1950 Referring Provider: Dr. Levada Schilling   Encounter Date: 10/04/2017  PT End of Session - 10/05/17 1058    Visit Number  26    Number of Visits  66    Date for PT Re-Evaluation  12/30/17    Authorization Type  [employer] Medicare    Authorization Time Period  10-04-17 - 12-30-17    PT Start Time  1450    PT Stop Time  1535    PT Time Calculation (min)  45 min       Past Medical History:  Diagnosis Date  . Arthritis    lt great toe  . Complication of anesthesia    pt has had headaches post op-did advise to hydra well preop  . Hair loss    right-sided  . History of exercise stress test    a. ETT (12/15) with 12:00 exercise, no ST changes, occasional PACs.   . Hyperlipidemia   . Insomnia   . Migraine headache   . Movement disorder    resltess in left legs  . Postmenopausal   . 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 Premature atrial contractions    a. Holter (12/15) with 8% PACs, no atrial runs or atrial fibrillation.     Past Surgical History:  Procedure Laterality Date  . BREAST BIOPSY  01/01/2011   Procedure: BREAST BIOPSY WITH NEEDLE LOCALIZATION;  Surgeon: Rolm Bookbinder, MD;  Location: [facility];  Service: General;  Laterality: Left;  left breast wire localization biopsy  . BREAST BIOPSY Right 07/27/2012  . BREAST BIOPSY Right 07/07/2016  . BREAST BIOPSY Left 06/30/2016  . cataracts right eye    . CESAREAN SECTION  1986  . HERNIA REPAIR  2000   RIH  . INTRAMEDULLARY (IM) NAIL INTERTROCHANTERIC Left 01/30/2017   Procedure: ORIF AFFIXUS NAIL;  Surgeon: Paralee Cancel, MD;  Location: [facility];  Service: Orthopedics;  Laterality: Left;  . IR REPLC GASTRO/COLONIC TUBE PERCUT W/FLUORO  09/29/2016  .  opirectinal membrane peel    . PEG PLACEMENT    . RHINOPLASTY  1983  . TRACHEOSTOMY    . TRACHEOSTOMY TUBE PLACEMENT N/A 12/15/2016   Procedure: TRACHEOSTOMY CHANGE;  Surgeon: [name] Montane, MD;  Location: [facility];  Service: ENT;  Laterality: N/A;    There were no vitals filed for this visit.  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  No/denies                       [facility] Adult PT Treatment/Exercise - 10/05/17 0001      Transfers   Transfers  Sit to Stand    Sit to Stand  3: Mod assist    Sit  to Stand Details  Tactile cues for sequencing;Tactile cues for posture;Tactile cues for weight shifting;Verbal cues for sequencing;Verbal cues for technique;Manual facilitation for weight shifting;Manual facilitation for placement;Manual facilitation for weight bearing    Stand to Sit  3: Mod assist    Stand to Sit Details (indicate cue type and reason)  Tactile cues for sequencing;Tactile cues for weight shifting;Tactile cues for posture;Tactile cues for placement;Verbal cues for sequencing;Verbal cues for technique;Manual facilitation for weight shifting;Manual facilitation for placement;Manual facilitation for weight bearing      Ambulation/Gait   Ambulation/Gait  Yes    Ambulation/Gait Assistance  3: Mod assist   +2 assisted    Ambulation/Gait Assistance Details  bil. Spry AFO's used on each LE    Ambulation Distance (Feet)  23 Feet    Assistive device  Other (Comment)   used the Steady turned  around backwards   Gait Pattern  Step-to pattern;Step-through pattern;Decreased stride length;Decreased dorsiflexion - right;Decreased dorsiflexion - left;Decreased weight shift to left;Ataxic;Trunk flexed;Narrow base of support;Poor foot clearance - left   Caregiver, [name], assisted pt in advancing LLE   Ambulation Surface  Level;Indoor      Pt sat unsupported on side of high/low mat table for 5" with min assist with RUE support; mirror used for visual feedback - pt also Given verbal cues for weight shift toward left side and for increased Rt lateral cervical flexion for more upright posture    Pt stood for 5" with use of Stedy turned around to enable bil. UE support - bolster placed in open space of Stedy for skin protection of Lt elbow area with weight bearing; pt intermittently braced RLE against mat table for support & stabilization; pt able to stand for 5" with CGA to min assist - mirror used with standing activity for visual feedback for improved posture      PT Short Term Goals - 06/10/17 1305      PT SHORT TERM GOAL #1   Title  Perform sit to/from stand from wheelchair or mat with +1 mod assist.    Baseline  06/09/16: met today    Status  Achieved      PT SHORT TERM GOAL #2   Title  Pt will perform sit to stand from wheelchair to RW with max assist for pre-gait skill.    Baseline  06/09/17: met today    Status  Achieved      PT SHORT TERM GOAL #3   Title  Pt will transfer supine to sit with max assist.    Baseline  06/09/17: met today, needs increased time to assist    Status  Achieved      PT SHORT TERM GOAL #4   Title  Pt will sit with RUE support with min to mod assist unsupported on side of mat for at least 3" to demo improved trunk strength and core stabilization.    Baseline  06/09/17: met today     Status  Achieved      PT SHORT TERM GOAL #5   Title  Pt will amb. 15' with appropriate assistive device with max assist +1    Baseline  06/09/17: pt now needs mod/max  assist of 2 people for gait using modified 3 muskateer assist and blue rocker AFO to right LE    Status  Partially Met        PT Long Term Goals - 10/05/17 1042      PT LONG TERM GOAL #3   Title  Pt will  amb. 40' with appropriate assistive device with +1 max assist.    Baseline  amb. 61' with Stedy with +2 mod assist with bil. AFO's on 10-04-17    Time  12    Period  Weeks    Status  New    Target Date  12/30/17      PT LONG TERM GOAL #4   Title  Pt will maintain sitting balance with RUE support for at least 5" with SBA for increased independence with ADL's.    Baseline  min assist needed on 10-04-17 for period of 5" seated on side of mat     Time  12    Period  Weeks    Status  New    Target Date  12/30/17      PT LONG TERM GOAL #5   Title  Perform static standing x 5 with bil. UE support with SBA for assistance with ADL's and for LE weight-bearing and stretching.    Baseline  able to stand 5" with Charlaine Dalton turned backwards     Time  12    Period  Weeks    Status  New    Target Date  12/30/17      PT LONG TERM GOAL #6   Title  Pt will be able to return to participation in therapeutic horseback riding at [facility] for ongoing exercise program. (NEW MAINTAINANCE GOALS:  (Reassess 11-18-17 & 12-30-17)     Baseline  Not returned as of 09/29/17 - pt has had bil. hip surgeries    Time  12    Period  Weeks    Status  On-going    Target Date  12/30/17      PT LONG TERM GOAL #7   Title  Pt will maintain ability to transfer (stand pivot) from varying seated surfaces at max A level to decrease burden of care to single caregiver.   12-30-17 TARGET DATE    Baseline  mod to max A (variable) on 09/29/17    Time  12    Period  Weeks    Status  On-going      PT LONG TERM GOAL #8   Title  Pt will maintain sitting balance with RUE support at min A level x 5 minutes in order to maintain postural and trunk control without external support of w/c in order to indicate improved postural alignment.       Baseline  Pt able to sit at S to min A level with RUE support x 5 mins, requires increased support if RUE not able to support on mat 09/29/17; met 10-04-17    Status  Achieved      PT LONG TERM GOAL  #9   TITLE  Pt will maintain ability to ambulate with +2 max A for 20' in order to faciliate weight bearing to maintain bone density, decrease spasticity, and decrease the progression of contractures.     Baseline  Pt able to ambulate x 25' with +2 max A 09/29/17; +2 mod to max assist with Stedy for 23' on 10-04-17    Status  Achieved      PT LONG TERM GOAL  #10   TITLE  Pt will maintain ability to stand with RUE support x 5 mins at min A level for LE stretching, for improved bowel, bladder and respiratory function.      Baseline  met with standing with Stedy turned around backwards on 10-04-17    Status  Achieved  Plan - 10/05/17 1059    Clinical Impression Statement  Pt has met LTG's # 3, 4, and 5:  LTG's #1 & 2 remain ongoing as status fluctuates and performance varies;  pt noted to have improved mobility with reduction in spasticity due to increased dosage of Baclofen medication per caregiver report on 10-04-17 ; pt improved with gait training with use of bil. Spry AFO's and leather toe caps on each shoe     Rehab Potential  Fair    Clinical Impairments Affecting Rehab Potential  severity of deficits with previous extensive therapies    PT Frequency  1x / week    PT Duration  12 weeks    PT Treatment/Interventions  ADLs/Self Care Home Management;Therapeutic exercise;Therapeutic activities;Functional mobility training;Gait training;Balance training;Neuromuscular re-education;Patient/family education;Orthotic Fit/Training;Passive range of motion    PT Next Visit Plan  forward weight shift/trunk lean, tone reduction, standing balance, gait with B spry step PLS AFOs - use Stedy ; try peanut physioball if assistance is present    PT Home Exercise Plan  caregiver and husband have  previously been instructed in ROM, sitting balance, and transfer training    Consulted and Agree with Plan of Care  Patient;Family member/caregiver       Patient will benefit from skilled therapeutic intervention in order to improve the following deficits and impairments:  Abnormal gait, Decreased activity tolerance, Decreased balance, Decreased cognition, Decreased coordination, Decreased mobility, Decreased range of motion, Decreased endurance, Hypomobility, Decreased strength, Impaired flexibility, Increased muscle spasms, Impaired UE functional use, Postural dysfunction, Pain  Visit Diagnosis: Other abnormalities of gait and mobility - Plan: PT plan of care cert/re-cert  Muscle weakness (generalized) - Plan: PT plan of care cert/re-cert  Abnormal posture - Plan: PT plan of care cert/re-cert  Other symptoms and signs involving the nervous system - Plan: PT plan of care cert/re-cert  Unsteadiness on feet - Plan: PT plan of care cert/re-cert     Problem List Patient Active Problem List   Diagnosis Date Noted  . Coagulase negative Staphylococcus bacteremia 02/09/2017  . Abdominal distension   . Chronic mucus hypersecretion, respiratory 12/31/2016  . Severe protein-calorie malnutrition [name]: less than 60% of standard weight) ([place]) 12/31/2016  . Acute urinary retention 12/31/2016  . Constipation 12/31/2016  . Shortness of breath   . Respiratory distress   . Pneumonia 12/05/2016  . Acute respiratory failure ([place]) 12/05/2016  . Pseudomonas urinary tract infection 10/27/2016  . Severe muscle deconditioning 09/30/2016  . Pressure injury of skin 09/28/2016  . Respiratory failure ([place])   . Tracheobronchitis   . Urinary retention   . PEG (percutaneous endoscopic gastrostomy) status ([place])   . Benign essential HTN   . Hyponatremia   . Hypokalemia   . Reactive hypertension   . Respiratory insufficiency 09/24/2016  . Malignant neoplasm of upper-inner quadrant of left breast in  female, estrogen receptor positive ([place]) 07/14/2016  . Heterotopic ossification of bone 03/10/2016  . Acute on chronic respiratory failure with hypoxia ([place])   . Acute hypoxemic respiratory failure ([place])   . Acute blood loss anemia   . Atelectasis 02/04/2016  . Debility 02/03/2016  . Pneumonia of both lower lobes due to Pseudomonas species ([place])   . Pneumonia of both lower lobes due to methicillin susceptible Staphylococcus aureus (MSSA) ([place])   . Acute respiratory failure with hypoxia ([place])   . Acute encephalopathy   . Seizures ([facility])   . Sepsis ([place])   . Hypoxia   . Pain   . Chronic  respiratory failure ([place]) 12/28/2015  . Acute tracheobronchitis 12/24/2015  . Tracheostomy tube present ([place])   . Tracheal stenosis   . Chronic respiratory failure with hypoxia ([place])   . 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 Tracheostomy status ([place]) 09/26/2015  . Allergic rhinitis 09/26/2015  . 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 Movement disorder 09/22/2015  . Encephalitis 09/22/2015  . Chest pain 02/07/2014  . Premature atrial contractions 01/13/2014  . Abnormality of gait 12/04/2013  . Hyperlipidemia 12/21/2011  . Cardiovascular risk factor 12/21/2011  . Bunion 01/31/2008  . Metatarsalgia of both feet 09/20/2007  . FLAT FOOT 09/20/2007  . HALLUX RIGIDUS, ACQUIRED 09/20/2007    Alda Lea, PT 10/05/2017, 11:13 AM  [facility] [address] [place], [place], [address] Phone: [phone]   Fax:  [phone]  Name: Judy Spears MRN: [id number] Date of Birth: 07/29/1950

## 2017-10-07 ENCOUNTER — Encounter: Payer: Self-pay | Admitting: Podiatry

## 2017-10-07 ENCOUNTER — Ambulatory Visit: Payer: Medicare Other | Admitting: Occupational Therapy

## 2017-10-07 ENCOUNTER — Ambulatory Visit: Payer: Medicare Other | Admitting: Podiatry

## 2017-10-07 ENCOUNTER — Encounter: Payer: Self-pay | Admitting: Occupational Therapy

## 2017-10-07 DIAGNOSIS — M79675 Pain in left toe(s): Secondary | ICD-10-CM | POA: Diagnosis not present

## 2017-10-07 DIAGNOSIS — M79674 Pain in right toe(s): Secondary | ICD-10-CM | POA: Diagnosis not present

## 2017-10-07 DIAGNOSIS — R2689 Other abnormalities of gait and mobility: Secondary | ICD-10-CM | POA: Diagnosis not present

## 2017-10-07 DIAGNOSIS — R2681 Unsteadiness on feet: Secondary | ICD-10-CM

## 2017-10-07 DIAGNOSIS — B351 Tinea unguium: Secondary | ICD-10-CM

## 2017-10-07 DIAGNOSIS — L84 Corns and callosities: Secondary | ICD-10-CM

## 2017-10-07 DIAGNOSIS — M25622 Stiffness of left elbow, not elsewhere classified: Secondary | ICD-10-CM

## 2017-10-07 DIAGNOSIS — B353 Tinea pedis: Secondary | ICD-10-CM

## 2017-10-07 DIAGNOSIS — M25612 Stiffness of left shoulder, not elsewhere classified: Secondary | ICD-10-CM

## 2017-10-07 DIAGNOSIS — R293 Abnormal posture: Secondary | ICD-10-CM

## 2017-10-07 DIAGNOSIS — R278 Other lack of coordination: Secondary | ICD-10-CM

## 2017-10-07 DIAGNOSIS — R29818 Other symptoms and signs involving the nervous system: Secondary | ICD-10-CM

## 2017-10-07 DIAGNOSIS — M6281 Muscle weakness (generalized): Secondary | ICD-10-CM

## 2017-10-07 DIAGNOSIS — R41844 Frontal lobe and executive function deficit: Secondary | ICD-10-CM

## 2017-10-07 DIAGNOSIS — M25611 Stiffness of right shoulder, not elsewhere classified: Secondary | ICD-10-CM

## 2017-10-07 NOTE — Therapy (Signed)
[facility] [address] [place], [place], [address] Phone: [phone]   Fax:  [phone]  Occupational Therapy Treatment  Patient Details  Name: Judy Spears MRN: [id number] Date of Birth: [birth year] Referring Provider: Dr. Levada Schilling   Encounter Date: 10/07/2017  OT End of Session - 10/07/17 1645    Visit Number  23    Number of Visits  44    Date for OT Re-Evaluation  12/20/17    Authorization Type  medicare maintenance program - pt to be reassessed every 30 days per Medicare guidelines  Will need PN every 10th visit    Authorization Time Period  90 days - 12/20/2017    Authorization - Visit Number  58    Authorization - Number of Visits  30    OT Start Time  9311    OT Stop Time  1450    OT Time Calculation (min)  45 min       Past Medical History:  Diagnosis Date  . Arthritis    lt great toe  . Complication of anesthesia    pt has had headaches post op-did advise to hydra well preop  . Hair loss    right-sided  . History of exercise stress test    a. ETT (12/15) with 12:00 exercise, no ST changes, occasional PACs.   . Hyperlipidemia   . Insomnia   . Migraine headache   . Movement disorder    resltess in left legs  . Postmenopausal   . 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 Premature atrial contractions    a. Holter (12/15) with 8% PACs, no atrial runs or atrial fibrillation.     Past Surgical History:  Procedure Laterality Date  . BREAST BIOPSY  01/01/2011   Procedure: BREAST BIOPSY WITH NEEDLE LOCALIZATION;  Surgeon: Rolm Bookbinder, MD;  Location: [facility];  Service: General;  Laterality: Left;  left breast wire localization biopsy  . BREAST BIOPSY Right 07/27/2012  . BREAST BIOPSY Right 07/07/2016  . BREAST BIOPSY Left 06/30/2016  . cataracts right eye    . CESAREAN SECTION  1986  . HERNIA REPAIR  2000   RIH  . INTRAMEDULLARY (IM) NAIL INTERTROCHANTERIC Left 01/30/2017    Procedure: ORIF AFFIXUS NAIL;  Surgeon: Paralee Cancel, MD;  Location: [facility];  Service: Orthopedics;  Laterality: Left;  . IR REPLC GASTRO/COLONIC TUBE PERCUT W/FLUORO  09/29/2016  . opirectinal membrane peel    . PEG PLACEMENT    . RHINOPLASTY  1983  . TRACHEOSTOMY    . TRACHEOSTOMY TUBE PLACEMENT N/A 12/15/2016   Procedure: TRACHEOSTOMY CHANGE;  Surgeon: [name] Montane, MD;  Location: [facility];  Service: ENT;  Laterality: N/A;    There were no vitals filed for this visit.  Subjective Assessment - 10/07/17 1639    Subjective   Caregiver indicated "Dr Amedeo Plenty says she needs Botox"  (in left elbow)    Pertinent History  see epic snapshot - ABI/encephalitis/hypoxia from virus; recent hospitalization due to MRSA PNA     Patient Stated Goals  Pt unable to state.     Currently in Pain?  No/denies    Pain Score  0-No pain                   OT Treatments/Exercises (OP) - 10/07/17 0001      ADLs   ADL Comments  Patient had two episodes this weekend, when she had a sudden onset coughing spell.  Her  cough is weak, and nonproductive, and coughing appears to dsitress her - although immediately following event - patient with o2 sats @ 94%.        Neurological Re-education Exercises   Other Exercises 1  Neuromuscular reeducation to address sit to stand and standing weight shifts with emphasis on more midline orientation.      Other Exercises 2  Patient saw ortho surgeon yesterday - who indicated that he did not believe Left elbow was a bony / fixed deformity, but rather musculature (after viewing xrays)  MD recommended Botox injection for left elbow.  Referred back to Dr Pricilla Holm.  Dr Amedeo Plenty also ordered a night time splint for left elbow - caregiver did not know name of splint.  For third digit right hand - MD recommended splint to promote finger extension.  Patient's husband is ordering that splint.                 OT Short Term Goals - 07/14/17 1433      OT SHORT TERM GOAL #1    Title  --    Status  --      OT SHORT TERM GOAL #2   Title  --    Status  --      OT SHORT TERM GOAL #6   Title  --      OT SHORT TERM GOAL #7   Title  --        OT Long Term Goals - 09/29/17 1214      OT LONG TERM GOAL #1   Title  Pt will maintain ability to roll in bed during self care tasks with no more than max a x1 - reasssess 10/25/2017    Baseline  09/27/2017  pt able to maintain rolling with max a x1     Status  On-going   09/27/2017 pt able to roll with max a x1     OT LONG TERM GOAL #2   Title  Pt will maintain the ability to use roll in shower chair with care giver assistance     Baseline  09/27/2017 pt continues to demonsrate adequate trunk control, sititng balance and decreased tone to use shower chair with care giver assistance.    Status  On-going      OT LONG TERM GOAL #3   Title  Pt will maintain ability to transfer to toilet with no more than max a x1 using Steady Assist - 10/06/2017    Baseline  09/27/2017 pt transfers with steady assist to toilet with max a x1    Status  On-going      OT LONG TERM GOAL #4   Title  Pt wil maintain ability for dressing at max a x1.     Baseline  09/27/2017  pt able to be assisted with dressing by 1 person only at max a level    Status  On-going      OT LONG TERM GOAL #5   Title  Pt will maintain ability to stand with max a x1 to for clothing mgmt with toileting.     Baseline  09/27/2017  pt able to stand with max a x 1 during toileting tasks    Status  On-going      OT LONG TERM GOAL #6   Title  Pt will maintain ability to participate in functional transfers, as well as stand to sit and sit to stand with no more than max a x1    Status  On-going  09/27/2017  pt able ot transfer and complete sit to stand with max a x1     OT LONG TERM GOAL #7   Title  Pt will maintain functional mobility and trunk control to aid in her ability to clear sucretions and assist with phonation to express basic needs/wants   09/27/2017 pt has remained  free of respiratory issues and intermittently improved phonation   Status  On-going            Plan - 10/07/17 1646    Clinical Impression Statement  Patient seen twice this week, and each time was alert and particiaptive in session.  Patient has had coughing spells, question if related to mucous- difficulty clearing throat/ weak, non-productive cough - although maintaining therapeutic o2 levels.      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    Occupational performance deficits (Please refer to evaluation for details):  ADL's;IADL's;Rest and Sleep;Leisure;Social Participation;Work    Neurosurgeon    Current Impairments/barriers affecting progress:  severity of multi system deficits, intermittent medical issues    OT Frequency  2x / week    OT Duration  12 weeks    OT Treatment/Interventions  Self-care/ADL training;Ultrasound;Moist Heat;Electrical Stimulation;DME and/or AE instruction;Neuromuscular education;Therapeutic exercise;Functional Mobility Training;Manual Therapy;Passive range of motion;Splinting;Therapeutic activities;Balance training;Patient/family education;Visual/perceptual remediation/compensation;Cognitive remediation/compensation    Plan  address rigidity and tone in trunk and extremeties, NMR trunk and LE's for transitional movements, static standing, sitting balance, sit to stand, stand to sit, stepping; may want to get PROM measurement in left elbow in prep for Botox    Consulted and Agree with Plan of Care  Patient;Family member/caregiver    Family Member Consulted  caregiver [name]       Patient will benefit from skilled therapeutic intervention in order to improve the following deficits and impairments:  Decreased endurance, Decreased skin  integrity, Impaired vision/preception, Improper body mechanics, Impaired perceived functional ability, Decreased knowledge of precautions, Cardiopulmonary status limiting activity, Decreased activity tolerance, Decreased knowledge of use of DME, Decreased strength, Impaired flexibility, Improper spinal/pelvic alignment, Impaired sensation, Difficulty walking, Decreased mobility, Decreased balance, Decreased cognition, Decreased range of motion, Impaired tone, Impaired UE functional use, Decreased safety awareness, Decreased coordination, Pain, Abnormal gait  Visit Diagnosis: Abnormal posture  Muscle weakness (generalized)  Other symptoms and signs involving the nervous system  Unsteadiness on feet  Frontal lobe and executive function deficit  Stiffness of right shoulder, not elsewhere classified  Stiffness of left shoulder, not elsewhere classified  Stiffness of left elbow, not elsewhere classified  Other lack of coordination    Problem List Patient Active Problem List   Diagnosis Date Noted  . Coagulase negative Staphylococcus bacteremia 02/09/2017  . Abdominal distension   . Chronic mucus hypersecretion, respiratory 12/31/2016  . Severe protein-calorie malnutrition [name]: less than 60% of standard weight) ([place]) 12/31/2016  . Acute urinary retention 12/31/2016  . Constipation 12/31/2016  . Shortness of breath   . Respiratory distress   . Pneumonia 12/05/2016  . Acute respiratory failure ([place]) 12/05/2016  . Pseudomonas urinary tract infection 10/27/2016  . Severe muscle deconditioning 09/30/2016  . Pressure injury of skin 09/28/2016  . Respiratory failure ([place])   . Tracheobronchitis   . Urinary retention   . PEG (percutaneous endoscopic gastrostomy) status ([place])   . Benign essential HTN   .  Hyponatremia   . Hypokalemia   . Reactive hypertension   . Respiratory insufficiency 09/24/2016  . Malignant neoplasm of upper-inner quadrant of left breast in female, estrogen  receptor positive ([place]) 07/14/2016  . Heterotopic ossification of bone 03/10/2016  . Acute on chronic respiratory failure with hypoxia ([facility])   . Acute hypoxemic respiratory failure ([place])   . Acute blood loss anemia   . Atelectasis 02/04/2016  . Debility 02/03/2016  . Pneumonia of both lower lobes due to Pseudomonas species ([place])   . Pneumonia of both lower lobes due to methicillin susceptible Staphylococcus aureus (MSSA) ([place])   . Acute respiratory failure with hypoxia ([place])   . Acute encephalopathy   . Seizures ([place])   . Sepsis ([place])   . Hypoxia   . Pain   . Chronic respiratory failure ([place]) 12/28/2015  . Acute tracheobronchitis 12/24/2015  . Tracheostomy tube present ([place])   . Tracheal stenosis   . Chronic respiratory failure with hypoxia ([place])   . Spastic tetraplegia ([place]) 10/20/2015  . Tracheostomy status ([place]) 09/26/2015  . Allergic rhinitis 09/26/2015  . 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 Movement disorder 09/22/2015  . Encephalitis 09/22/2015  . Chest pain 02/07/2014  . Premature atrial contractions 01/13/2014  . Abnormality of gait 12/04/2013  . Hyperlipidemia 12/21/2011  . Cardiovascular risk factor 12/21/2011  . Bunion 01/31/2008  . Metatarsalgia of both feet 09/20/2007  . FLAT FOOT 09/20/2007  . HALLUX RIGIDUS, ACQUIRED 09/20/2007    Mariah Milling, OTR/L 10/07/2017, 4:50 PM  [facility] [address] [place], [place], [address] Phone: [phone]   Fax:  [phone]  Name: Judy Spears MRN: [id number] Date of Birth: 26-Mar-1950

## 2017-10-09 NOTE — Progress Notes (Signed)
Subjective: [name] is accompanied by her daytime caregiver on today. She is seen today for followup visit, preventative foot care. She has painful mycotic nails and chronic corn right 4th digit secondary to hammertoe.   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 Painful onychomycosis b/l  2. Painful corn right 4th toe 3. Tinea pedis  Plan: 1. Discussed tinea pedis and topical treatment options with her caretaker. Recommended Lotrimin Spray to feet before applying compression hose. 2. Toenails debrided in length and girth 1-5 b/l.  3. Corn debrided right 4th toe; dispensed silicone toe cap with instructions for use to minimize corn formation on this digit.  Instructions:  Continue to apply to right 4th toe every morning and remove before bedtime. 4. Follow up 3 months. 5. Husband to call should there be a concern in the interim.

## 2017-10-11 ENCOUNTER — Ambulatory Visit: Payer: Medicare Other | Admitting: Physical Therapy

## 2017-10-11 ENCOUNTER — Ambulatory Visit: Payer: Medicare Other | Admitting: Occupational Therapy

## 2017-10-11 DIAGNOSIS — R293 Abnormal posture: Secondary | ICD-10-CM

## 2017-10-11 DIAGNOSIS — M25622 Stiffness of left elbow, not elsewhere classified: Secondary | ICD-10-CM

## 2017-10-11 DIAGNOSIS — R278 Other lack of coordination: Secondary | ICD-10-CM

## 2017-10-11 DIAGNOSIS — R2681 Unsteadiness on feet: Secondary | ICD-10-CM

## 2017-10-11 DIAGNOSIS — M25612 Stiffness of left shoulder, not elsewhere classified: Secondary | ICD-10-CM

## 2017-10-11 DIAGNOSIS — R41844 Frontal lobe and executive function deficit: Secondary | ICD-10-CM

## 2017-10-11 DIAGNOSIS — R29818 Other symptoms and signs involving the nervous system: Secondary | ICD-10-CM

## 2017-10-11 DIAGNOSIS — M25611 Stiffness of right shoulder, not elsewhere classified: Secondary | ICD-10-CM

## 2017-10-11 DIAGNOSIS — M6281 Muscle weakness (generalized): Secondary | ICD-10-CM

## 2017-10-12 ENCOUNTER — Encounter: Payer: Self-pay | Admitting: Occupational Therapy

## 2017-10-12 NOTE — Therapy (Signed)
[facility] [address] [place], [place], [address] Phone: [phone]   Fax:  [phone]  Occupational Therapy Treatment  Patient Details  Name: Judy Spears MRN: [id number] Date of Birth: 1950-04-30 Referring Provider: Dr. Levada Schilling   Encounter Date: 10/11/2017  OT End of Session - 10/12/17 0831    Visit Number  24    Number of Visits  44    Date for OT Re-Evaluation  12/20/17    Authorization Type  medicare maintenance program - pt to be reassessed every 30 days per Medicare guidelines  Will need PN every 10th visit    Authorization Time Period  90 days - 12/20/2017    Authorization - Visit Number  24    Authorization - Number of Visits  30    OT Start Time  1446    OT Stop Time  1530    OT Time Calculation (min)  44 min    Activity Tolerance  Patient tolerated treatment well       Past Medical History:  Diagnosis Date  . Arthritis    lt great toe  . Complication of anesthesia    pt has had headaches post op-did advise to hydra well preop  . Hair loss    right-sided  . History of exercise stress test    a. ETT (12/15) with 12:00 exercise, no ST changes, occasional PACs.   . Hyperlipidemia   . Insomnia   . Migraine headache   . Movement disorder    resltess in left legs  . Postmenopausal   . 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 Premature atrial contractions    a. Holter (12/15) with 8% PACs, no atrial runs or atrial fibrillation.     Past Surgical History:  Procedure Laterality Date  . BREAST BIOPSY  01/01/2011   Procedure: BREAST BIOPSY WITH NEEDLE LOCALIZATION;  Surgeon: Rolm Bookbinder, MD;  Location: [facility];  Service: General;  Laterality: Left;  left breast wire localization biopsy  . BREAST BIOPSY Right 07/27/2012  . BREAST BIOPSY Right 07/07/2016  . BREAST BIOPSY Left 06/30/2016  . cataracts right eye    . CESAREAN SECTION  1986  . HERNIA REPAIR  2000   RIH  .  INTRAMEDULLARY (IM) NAIL INTERTROCHANTERIC Left 01/30/2017   Procedure: ORIF AFFIXUS NAIL;  Surgeon: Paralee Cancel, MD;  Location: [facility];  Service: Orthopedics;  Laterality: Left;  . IR REPLC GASTRO/COLONIC TUBE PERCUT W/FLUORO  09/29/2016  . opirectinal membrane peel    . PEG PLACEMENT    . RHINOPLASTY  1983  . TRACHEOSTOMY    . TRACHEOSTOMY TUBE PLACEMENT N/A 12/15/2016   Procedure: TRACHEOSTOMY CHANGE;  Surgeon: [name] Montane, MD;  Location: [facility];  Service: ENT;  Laterality: N/A;    There were no vitals filed for this visit.  Subjective Assessment - 10/12/17 0817    Subjective   Caregiver indicates she was unable to put on new AFO's this afternoon due to LE tension.      Pertinent History  see epic snapshot - ABI/encephalitis/hypoxia from virus; recent hospitalization due to MRSA PNA     Patient Stated Goals  Pt unable to state.     Currently in Pain?  No/denies    Pain Score  0-No pain                   OT Treatments/Exercises (OP) - 10/12/17 0001      ADLs   ADL Comments  Patient has new Bilateral AFO's- caregiver brought as she was unable to put them on today.  Attempted to put them on patient while she was seated in wheelchair.  Initially patient with tremendous tension in RLE - unable to flex right knee.  With cueing and time patient able to actively reduce tension to allow knee to bend, but unable to relax foot sufficiently to fit AFO.  Patient attempting to voice - repetitively, but unable to communicate effectively.  Tried using yes/no questioning to narrow field to determine her message - but patient unable to follow .  Patient with decreased sustained attention this session - caregiver indicates no fever, no trouble wth O2 level, no signs of infection - mild redenss in both eyes.        Neurological Re-education Exercises   Other Exercises 1  Neuromuscular reeducation to address bed mobility - sitting to sidelying to supine.  Once movement initiated flexion / rotation  - patient able to continue movement in mid range.  Patient with very limited dissociation between upper / lower trunk head/neck with rolling,, although flexed position seemed to reduce overall muscle tension.  Patient appeared to doze off after short period of time in sidelying, and required time to regain full level of alertness.  Nystagmus still very apparent with position change to supine.      Other Exercises 2  Stretch to left elbow to begin to determine end range.  Patient able to consciously relax elbow flexion to extend elbow  with guidance - to approximately 90 degrees.  Beyond this point, joint range was avaialble, but time did not allow for further active stretching.                 OT Short Term Goals - 10/12/17 0835      OT SHORT TERM GOAL #6   Title  Pt will maintain ability to roll in bed during self care tasks with no more than max a x1 - 08/17/2017    Status  Achieved      OT SHORT TERM GOAL #7   Title  Pt will maintain the ability to use roll in shower chair with care giver assistance     Status  On-going        OT Long Term Goals - 10/12/17 0836      OT LONG TERM GOAL #1   Title  Pt will maintain ability to roll in bed during self care tasks with no more than max a x1 - reasssess 10/25/2017    Status  Achieved      OT LONG TERM GOAL #2   Title  Pt will maintain the ability to use roll in shower chair with care giver assistance     Baseline  09/27/2017 pt continues to demonsrate adequate trunk control, sititng balance and decreased tone to use shower chair with care giver assistance.    Status  On-going      OT LONG TERM GOAL #3   Title  Pt will maintain ability to transfer to toilet with no more than max a x1 using Steady Assist - 10/06/2017    Baseline  09/27/2017 pt transfers with steady assist to toilet with max a x1    Status  On-going      OT LONG TERM GOAL #4   Title  Pt wil maintain ability for dressing at max a x1.     Baseline  09/27/2017  pt able to be  assisted with dressing by 1 person  only at max a level    Status  On-going      OT LONG TERM GOAL #5   Title  Pt will maintain ability to stand with max a x1 to for clothing mgmt with toileting.     Baseline  09/27/2017  pt able to stand with max a x 1 during toileting tasks    Status  On-going      OT LONG TERM GOAL #6   Title  Pt will maintain ability to participate in functional transfers, as well as stand to sit and sit to stand with no more than max a x1    Status  On-going      OT LONG TERM GOAL #7   Title  Pt will maintain functional mobility and trunk control to aid in her ability to clear secretions and assist with phonation to express basic needs/wants    Status  On-going            Plan - 10/12/17 9735    Clinical Impression Statement  Patient with decreased ability to sustain attention this session, less alert than last week, more spasticity noted in LE especially.      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    Occupational performance deficits (Please refer to evaluation for details):  ADL's;IADL's;Rest and Sleep;Leisure;Social Participation;Work    Neurosurgeon    Current Impairments/barriers affecting progress:  severity of multi system deficits, intermittent medical issues    OT Frequency  2x / week    OT Duration  12 weeks    OT Treatment/Interventions  Self-care/ADL training;Ultrasound;Moist Heat;Electrical Stimulation;DME and/or AE instruction;Neuromuscular education;Therapeutic exercise;Functional Mobility Training;Manual Therapy;Passive range of motion;Splinting;Therapeutic activities;Balance training;Patient/family education;Visual/perceptual remediation/compensation;Cognitive remediation/compensation    Plan  Need actual elbow range  measurement as prebotox measure.  Need to offer suggestions for Botox in LE's as well.  address rigidity and tone in trunk and extremeties, NMR trunk and LE's for transitional movements, static standing, sitting balance, sit to stand, stand to sit, stepping; may want to get PROM measurement in left elbow in prep for Botox    Consulted and Agree with Plan of Care  Patient;Family member/caregiver    Family Member Consulted  caregiver [name]       Patient will benefit from skilled therapeutic intervention in order to improve the following deficits and impairments:     Visit Diagnosis: Abnormal posture  Muscle weakness (generalized)  Other symptoms and signs involving the nervous system  Unsteadiness on feet  Frontal lobe and executive function deficit  Stiffness of right shoulder, not elsewhere classified  Stiffness of left shoulder, not elsewhere classified  Stiffness of left elbow, not elsewhere classified  Other lack of coordination    Problem List Patient Active Problem List   Diagnosis Date Noted  . Coagulase negative Staphylococcus bacteremia 02/09/2017  . Abdominal distension   . Chronic mucus hypersecretion, respiratory 12/31/2016  . Severe protein-calorie malnutrition [name]: less than 60% of standard weight) ([place]) 12/31/2016  . Acute urinary retention 12/31/2016  . Constipation 12/31/2016  . Shortness of breath   . Respiratory distress   . Pneumonia 12/05/2016  . Acute respiratory failure ([place]) 12/05/2016  . Pseudomonas urinary tract infection 10/27/2016  . Severe muscle deconditioning 09/30/2016  . Pressure injury of skin 09/28/2016  . Respiratory failure ([place])   .  Tracheobronchitis   . Urinary retention   . PEG (percutaneous endoscopic gastrostomy) status ([place])   . Benign essential HTN   . Hyponatremia   . Hypokalemia   . Reactive hypertension   . Respiratory insufficiency 09/24/2016  . Malignant neoplasm of upper-inner quadrant of left breast in female,  estrogen receptor positive ([place]) 07/14/2016  . Heterotopic ossification of bone 03/10/2016  . Acute on chronic respiratory failure with hypoxia ([place])   . Acute hypoxemic respiratory failure ([place])   . Acute blood loss anemia   . Atelectasis 02/04/2016  . Debility 02/03/2016  . Pneumonia of both lower lobes due to Pseudomonas species ([place])   . Pneumonia of both lower lobes due to methicillin susceptible Staphylococcus aureus (MSSA) ([place])   . Acute respiratory failure with hypoxia ([place])   . Acute encephalopathy   . Seizures ([place])   . Sepsis ([place])   . Hypoxia   . Pain   . Chronic respiratory failure ([place]) 12/28/2015  . Acute tracheobronchitis 12/24/2015  . Tracheostomy tube present ([place])   . Tracheal stenosis   . Chronic respiratory failure with hypoxia ([place])   . Spastic tetraplegia ([place]) 10/20/2015  . Tracheostomy status ([place]) 09/26/2015  . Allergic rhinitis 09/26/2015  . 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 Movement disorder 09/22/2015  . Encephalitis 09/22/2015  . Chest pain 02/07/2014  . Premature atrial contractions 01/13/2014  . Abnormality of gait 12/04/2013  . Hyperlipidemia 12/21/2011  . Cardiovascular risk factor 12/21/2011  . Bunion 01/31/2008  . Metatarsalgia of both feet 09/20/2007  . FLAT FOOT 09/20/2007  . HALLUX RIGIDUS, ACQUIRED 09/20/2007    Mariah Milling, OTR/L 10/12/2017, 8:38 AM  [facility] [address] [place], [place], [address] Phone: [phone]   Fax:  [phone]  Name: Judy Spears MRN: [id number] Date of Birth: 1950-12-03

## 2017-10-13 ENCOUNTER — Ambulatory Visit: Payer: Medicare Other | Admitting: Occupational Therapy

## 2017-10-13 ENCOUNTER — Encounter: Payer: Self-pay | Admitting: Occupational Therapy

## 2017-10-13 DIAGNOSIS — R293 Abnormal posture: Secondary | ICD-10-CM

## 2017-10-13 DIAGNOSIS — R2689 Other abnormalities of gait and mobility: Secondary | ICD-10-CM | POA: Diagnosis not present

## 2017-10-13 DIAGNOSIS — M6281 Muscle weakness (generalized): Secondary | ICD-10-CM

## 2017-10-13 DIAGNOSIS — R2681 Unsteadiness on feet: Secondary | ICD-10-CM

## 2017-10-13 DIAGNOSIS — M25611 Stiffness of right shoulder, not elsewhere classified: Secondary | ICD-10-CM

## 2017-10-13 DIAGNOSIS — M25612 Stiffness of left shoulder, not elsewhere classified: Secondary | ICD-10-CM

## 2017-10-13 DIAGNOSIS — M25622 Stiffness of left elbow, not elsewhere classified: Secondary | ICD-10-CM

## 2017-10-13 DIAGNOSIS — R41844 Frontal lobe and executive function deficit: Secondary | ICD-10-CM

## 2017-10-13 DIAGNOSIS — R278 Other lack of coordination: Secondary | ICD-10-CM

## 2017-10-13 DIAGNOSIS — R29818 Other symptoms and signs involving the nervous system: Secondary | ICD-10-CM

## 2017-10-13 NOTE — Therapy (Signed)
[facility] [address] [place], [place], [address] Phone: [phone]   Fax:  [phone]  Occupational Therapy Treatment  Patient Details  Name: Judy Spears MRN: [id number] Date of Birth: 1950/11/27 Referring Provider: Dr. Levada Schilling   Encounter Date: 10/13/2017  OT End of Session - 10/13/17 1631    Visit Number  25    Number of Visits  44    Date for OT Re-Evaluation  12/20/17    Authorization Type  medicare maintenance program - pt to be reassessed every 30 days per Medicare guidelines  Will need PN every 10th visit    Authorization Time Period  90 days - 12/20/2017    Authorization - Visit Number  25    Authorization - Number of Visits  30    OT Start Time  6073    OT Stop Time  1445    OT Time Calculation (min)  42 min    Activity Tolerance  Other (comment)   limited by spasticity and agitation      Past Medical History:  Diagnosis Date  . Arthritis    lt great toe  . Complication of anesthesia    pt has had headaches post op-did advise to hydra well preop  . Hair loss    right-sided  . History of exercise stress test    a. ETT (12/15) with 12:00 exercise, no ST changes, occasional PACs.   . Hyperlipidemia   . Insomnia   . Migraine headache   . Movement disorder    resltess in left legs  . Postmenopausal   . 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 Premature atrial contractions    a. Holter (12/15) with 8% PACs, no atrial runs or atrial fibrillation.     Past Surgical History:  Procedure Laterality Date  . BREAST BIOPSY  01/01/2011   Procedure: BREAST BIOPSY WITH NEEDLE LOCALIZATION;  Surgeon: Rolm Bookbinder, MD;  Location: [facility];  Service: General;  Laterality: Left;  left breast wire localization biopsy  . BREAST BIOPSY Right 07/27/2012  . BREAST BIOPSY Right 07/07/2016  . BREAST BIOPSY Left 06/30/2016  . cataracts right eye    . CESAREAN SECTION  1986  . HERNIA  REPAIR  2000   RIH  . INTRAMEDULLARY (IM) NAIL INTERTROCHANTERIC Left 01/30/2017   Procedure: ORIF AFFIXUS NAIL;  Surgeon: Paralee Cancel, MD;  Location: [facility];  Service: Orthopedics;  Laterality: Left;  . IR REPLC GASTRO/COLONIC TUBE PERCUT W/FLUORO  09/29/2016  . opirectinal membrane peel    . PEG PLACEMENT    . RHINOPLASTY  1983  . TRACHEOSTOMY    . TRACHEOSTOMY TUBE PLACEMENT N/A 12/15/2016   Procedure: TRACHEOSTOMY CHANGE;  Surgeon: [name] Montane, MD;  Location: [facility];  Service: ENT;  Laterality: N/A;    There were no vitals filed for this visit.  Subjective Assessment - 10/13/17 1621    Subjective   Pt with significant moaning today however denied pain    Patient is accompained by:  Family member   husband [name] and caregiver [name]   Pertinent History  see epic snapshot - ABI/encephalitis/hypoxia from virus; recent hospitalization due to MRSA PNA     Patient Stated Goals  Pt unable to state.     Currently in Pain?  No/denies                   OT Treatments/Exercises (OP) - 10/13/17 0001      Neurological Re-education Exercises  Other Exercises 1  Pt with significant tone again today especially in RLE. Pt positioned in hip extension and RLE full extension upon arriving and had slid to edge of seat. Caregiver and husband stated they had repositioned her several times today.  Transferred pt to mat with max a x1 and pt able to assist with lifting bottom once assisted in forward flexion.  Pt then assisted into semi supine (on wedge due to feeding tube and recent meds).  2 person assist to address first creating LE hip and knee flexion and then facilitating lower trunk initiated rotation (second person stabilized upper trunk). Pt required slow rocking to decrease tone in order to move pt into flexed position. Also used slow meditative music as pt was fairly agitated today as evidenced by almost constant moaning/vocalizations that increased initiall with movement.  Then addressed upper  trunk flexion/rotation with pt assiting to bring arms across body and to bring head to side of rotation.  Cued to take deep breaths while in rotation in attempt to further decrease spasticity, relax pt and encourage improved respiration as well as to try and open rib cage.  Assisted pt back into sitting and tone greatly reduced.  At this point pt was no longer moaning and appeared calm and relaxed.  Transferred pt back to wheelchair with max a and pt did assist again with lift off.  Pt much easier to position in wheelchair at end of session.                 OT Short Term Goals - 10/12/17 0835      OT SHORT TERM GOAL #6   Title  Pt will maintain ability to roll in bed during self care tasks with no more than max a x1 - 08/17/2017    Status  Achieved      OT SHORT TERM GOAL #7   Title  Pt will maintain the ability to use roll in shower chair with care giver assistance     Status  On-going        OT Long Term Goals - 10/13/17 1629      OT LONG TERM GOAL #1   Title  Pt will maintain ability to roll in bed during self care tasks with no more than max a x1 - reasssess 10/25/2017    Status  On-going      OT LONG TERM GOAL #2   Title  Pt will maintain the ability to use roll in shower chair with care giver assistance     Baseline  09/27/2017 pt continues to demonsrate adequate trunk control, sititng balance and decreased tone to use shower chair with care giver assistance.    Status  On-going      OT LONG TERM GOAL #3   Title  Pt will maintain ability to transfer to toilet with no more than max a x1 using Steady Assist - 10/06/2017    Baseline  09/27/2017 pt transfers with steady assist to toilet with max a x1    Status  On-going      OT LONG TERM GOAL #4   Title  Pt wil maintain ability for dressing at max a x1.     Baseline  09/27/2017  pt able to be assisted with dressing by 1 person only at max a level    Status  On-going      OT LONG TERM GOAL #5   Title  Pt will maintain ability  to stand with max a  x1 to for clothing mgmt with toileting.     Baseline  09/27/2017  pt able to stand with max a x 1 during toileting tasks    Status  On-going      OT LONG TERM GOAL #6   Title  Pt will maintain ability to participate in functional transfers, as well as stand to sit and sit to stand with no more than max a x1    Status  On-going      OT LONG TERM GOAL #7   Title  Pt will maintain functional mobility and trunk control to aid in her ability to clear secretions and assist with phonation to express basic needs/wants    Status  On-going            Plan - 10/13/17 1629    Clinical Impression Statement  Pt continues to demonstrate signficant increase in tone despite recent increase in baclofen.  Husband and caregiver closely monitoring pt for any additional signs of distress.      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    Occupational performance deficits (Please refer to evaluation for details):  ADL's;IADL's;Rest and Sleep;Leisure;Social Participation;Work    Neurosurgeon    Current Impairments/barriers affecting progress:  severity of multi system deficits, intermittent medical issues    OT Frequency  2x / week    OT Duration  12 weeks    OT Treatment/Interventions  Self-care/ADL training;Ultrasound;Moist Heat;Electrical Stimulation;DME and/or AE instruction;Neuromuscular education;Therapeutic exercise;Functional Mobility Training;Manual Therapy;Passive range of motion;Splinting;Therapeutic activities;Balance training;Patient/family education;Visual/perceptual remediation/compensation;Cognitive remediation/compensation    Plan  Need actual elbow range measurement as prebotox measure.  Need to offer suggestions for Botox in LE's as well.  address  rigidity and tone in trunk and extremeties, NMR trunk and LE's for transitional movements, static standing, sitting balance, sit to stand, stand to sit, stepping; may want to get PROM measurement in left elbow in prep for Botox    Consulted and Agree with Plan of Care  Patient;Family member/caregiver    Family Member Consulted  caregiver [name]       Patient will benefit from skilled therapeutic intervention in order to improve the following deficits and impairments:  Decreased endurance, Decreased skin integrity, Impaired vision/preception, Improper body mechanics, Impaired perceived functional ability, Decreased knowledge of precautions, Cardiopulmonary status limiting activity, Decreased activity tolerance, Decreased knowledge of use of DME, Decreased strength, Impaired flexibility, Improper spinal/pelvic alignment, Impaired sensation, Difficulty walking, Decreased mobility, Decreased balance, Decreased cognition, Decreased range of motion, Impaired tone, Impaired UE functional use, Decreased safety awareness, Decreased coordination, Pain, Abnormal gait  Visit Diagnosis: Abnormal posture  Muscle weakness (generalized)  Other symptoms and signs involving the nervous system  Unsteadiness on feet  Frontal lobe and executive function deficit  Stiffness of right shoulder, not elsewhere classified  Stiffness of left shoulder, not elsewhere classified  Stiffness of left elbow, not elsewhere classified  Other lack of coordination    Problem List Patient Active Problem List   Diagnosis Date Noted  . Coagulase negative Staphylococcus bacteremia 02/09/2017  . Abdominal distension   . Chronic mucus hypersecretion, respiratory 12/31/2016  . Severe protein-calorie malnutrition [name]: less than 60% of standard weight) ([facility]) 12/31/2016  . Acute urinary retention 12/31/2016  . Constipation 12/31/2016  . Shortness of breath   . Respiratory distress   . Pneumonia  12/05/2016  . Acute  respiratory failure ([facility]) 12/05/2016  . Pseudomonas urinary tract infection 10/27/2016  . Severe muscle deconditioning 09/30/2016  . Pressure injury of skin 09/28/2016  . Respiratory failure ([facility])   . Tracheobronchitis   . Urinary retention   . PEG (percutaneous endoscopic gastrostomy) status ([facility])   . Benign essential HTN   . Hyponatremia   . Hypokalemia   . Reactive hypertension   . Respiratory insufficiency 09/24/2016  . Malignant neoplasm of upper-inner quadrant of left breast in female, estrogen receptor positive ([facility]) 07/14/2016  . Heterotopic ossification of bone 03/10/2016  . Acute on chronic respiratory failure with hypoxia ([facility])   . Acute hypoxemic respiratory failure ([facility])   . Acute blood loss anemia   . Atelectasis 02/04/2016  . Debility 02/03/2016  . Pneumonia of both lower lobes due to Pseudomonas species ([facility])   . Pneumonia of both lower lobes due to methicillin susceptible Staphylococcus aureus (MSSA) ([facility])   . Acute respiratory failure with hypoxia ([facility])   . Acute encephalopathy   . Seizures ([place])   . Sepsis ([facility])   . Hypoxia   . Pain   . Chronic respiratory failure ([facility]) 12/28/2015  . Acute tracheobronchitis 12/24/2015  . Tracheostomy tube present ([facility])   . Tracheal stenosis   . Chronic respiratory failure with hypoxia ([place])   . 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 Tracheostomy status ([facility]) 09/26/2015  . Allergic rhinitis 09/26/2015  . 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 Movement disorder 09/22/2015  . Encephalitis 09/22/2015  . Chest pain 02/07/2014  . Premature atrial contractions 01/13/2014  . Abnormality of gait 12/04/2013  . Hyperlipidemia 12/21/2011  . Cardiovascular risk factor 12/21/2011  . Bunion 01/31/2008  . Metatarsalgia of both feet 09/20/2007  . FLAT FOOT 09/20/2007  . HALLUX RIGIDUS, ACQUIRED 09/20/2007    Quay Burow, OTR/L 10/13/2017, 4:33 PM  [facility] [address] [place], [place], [address] Phone: [phone]   Fax:  [phone]  Name: Judy Spears MRN: [id number] Date of Birth: 18-Jul-1950

## 2017-10-14 ENCOUNTER — Encounter: Payer: Self-pay | Admitting: Rehabilitation

## 2017-10-14 ENCOUNTER — Ambulatory Visit: Payer: Medicare Other | Admitting: Rehabilitation

## 2017-10-14 DIAGNOSIS — M25652 Stiffness of left hip, not elsewhere classified: Secondary | ICD-10-CM

## 2017-10-14 DIAGNOSIS — M6281 Muscle weakness (generalized): Secondary | ICD-10-CM

## 2017-10-14 DIAGNOSIS — R293 Abnormal posture: Secondary | ICD-10-CM

## 2017-10-14 DIAGNOSIS — M25651 Stiffness of right hip, not elsewhere classified: Secondary | ICD-10-CM

## 2017-10-14 DIAGNOSIS — R2681 Unsteadiness on feet: Secondary | ICD-10-CM

## 2017-10-14 DIAGNOSIS — R29818 Other symptoms and signs involving the nervous system: Secondary | ICD-10-CM

## 2017-10-14 DIAGNOSIS — R2689 Other abnormalities of gait and mobility: Secondary | ICD-10-CM

## 2017-10-14 NOTE — Therapy (Signed)
[facility] [address] [place], [place], [address] Phone: [phone]   Fax:  [phone]  Physical Therapy Treatment  Patient Details  Name: Judy Spears MRN: [id number] Date of Birth: March 03, 1950 Referring Provider: Dr. Levada Schilling   Encounter Date: 10/14/2017  PT End of Session - 10/14/17 1500    Visit Number  27    Number of Visits  38    Date for PT Re-Evaluation  12/30/17    Authorization Type  [employer] Medicare    Authorization Time Period  10-04-17 - 12-30-17    PT Start Time  3335    PT Stop Time  1450    PT Time Calculation (min)  47 min    Activity Tolerance  Patient tolerated treatment well    Behavior During Therapy  [facility] for tasks assessed/performed       Past Medical History:  Diagnosis Date  . Arthritis    lt great toe  . Complication of anesthesia    pt has had headaches post op-did advise to hydra well preop  . Hair loss    right-sided  . History of exercise stress test    a. ETT (12/15) with 12:00 exercise, no ST changes, occasional PACs.   . Hyperlipidemia   . Insomnia   . Migraine headache   . Movement disorder    resltess in left legs  . Postmenopausal   . 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 Premature atrial contractions    a. Holter (12/15) with 8% PACs, no atrial runs or atrial fibrillation.     Past Surgical History:  Procedure Laterality Date  . BREAST BIOPSY  01/01/2011   Procedure: BREAST BIOPSY WITH NEEDLE LOCALIZATION;  Surgeon: Rolm Bookbinder, MD;  Location: [facility];  Service: General;  Laterality: Left;  left breast wire localization biopsy  . BREAST BIOPSY Right 07/27/2012  . BREAST BIOPSY Right 07/07/2016  . BREAST BIOPSY Left 06/30/2016  . cataracts right eye    . CESAREAN SECTION  1986  . HERNIA REPAIR  2000   RIH  . INTRAMEDULLARY (IM) NAIL INTERTROCHANTERIC Left 01/30/2017   Procedure: ORIF AFFIXUS NAIL;  Surgeon: Paralee Cancel, MD;  Location:  [place] [place];  Service: Orthopedics;  Laterality: Left;  . IR REPLC GASTRO/COLONIC TUBE PERCUT W/FLUORO  09/29/2016  . opirectinal membrane peel    . PEG PLACEMENT    . RHINOPLASTY  1983  . TRACHEOSTOMY    . TRACHEOSTOMY TUBE PLACEMENT N/A 12/15/2016   Procedure: TRACHEOSTOMY CHANGE;  Surgeon: [name] Montane, MD;  Location: [facility];  Service: ENT;  Laterality: N/A;    There were no vitals filed for this visit.  Subjective Assessment - 10/14/17 1459    Subjective  Pts caregiver [name] reports she feels that tone has been better today.  They are still unable to get AFOs/shoes donned due to tone.     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  No/denies            TE:  While seated at [facility], performed R trunk rotation x 3 reps with overpressure from PT.  Also had pt perform forward trunk flexion with cues to "reach for shoes."  She is able to get R hand to about shin level with min  to moderate assist.      NMR:  Ended session with short bout of gait x 25' with +2A (max A and min A from [place] ) with use of stedy (locked) for UE support however pt wanting to take large R step and quickly advance LLE, therefore stedy was too difficult to negotiate and assist pt therefore got rid of steady and provided +2A from PT and OT.  Heavy facilitation needed for upright posture (OT posterior/lateral to patient providing facilitation at trunk/ribs), forward and lateral weight shift and assist to finish forward advancement (more placement) of BLE.  PT providing cues and facilitation to allow pt increased time to complete weight shift prior to stepping.  Note good weight shift to the R and ability to increase R hip extension, however has marked difficulty with L lateral weight shift but is able to correct somewhat with visual and tactile cues to "come  towards" therapist.  During gait, note that she tends to forcefully extend R LE with R knee ext landing on R heel and causing severe ER, therefore provided cues for taking softer and smaller step on RLE with continued assist for proper placement of RLE.  PT provided list of recommended muscle groups for botox next week.                     PT Education - 10/14/17 1459    Education Details  Education on recommendations for muscle groups to be botoxed.      Person(s) Educated  Patient;Caregiver(s)    Methods  Explanation    Comprehension  Verbalized understanding       PT Short Term Goals - 06/10/17 1305      PT SHORT TERM GOAL #1   Title  Perform sit to/from stand from wheelchair or mat with +1 mod assist.    Baseline  06/09/16: met today    Status  Achieved      PT SHORT TERM GOAL #2   Title  Pt will perform sit to stand from wheelchair to RW with max assist for pre-gait skill.    Baseline  06/09/17: met today    Status  Achieved      PT SHORT TERM GOAL #3   Title  Pt will transfer supine to sit with max assist.    Baseline  06/09/17: met today, needs increased time to assist    Status  Achieved      PT SHORT TERM GOAL #4   Title  Pt will sit with RUE support with min to mod assist unsupported on side of mat for at least 3" to demo improved trunk strength and core stabilization.    Baseline  06/09/17: met today     Status  Achieved      PT SHORT TERM GOAL #5   Title  Pt will amb. 15' with appropriate assistive device with max assist +1    Baseline  06/09/17: pt now needs mod/max assist of 2 people for gait using modified 3 muskateer assist and blue rocker AFO to right LE    Status  Partially Met        PT Long Term Goals - 10/05/17 1042      PT LONG TERM GOAL #3   Title  Pt will amb. 76' with appropriate assistive device with +1 max assist.    Baseline  amb. 38' with Stedy with +2 mod assist with bil. AFO's on 10-04-17    Time  12  Period  Weeks    Status   New    Target Date  12/30/17      PT LONG TERM GOAL #4   Title  Pt will maintain sitting balance with RUE support for at least 5" with SBA for increased independence with ADL's.    Baseline  min assist needed on 10-04-17 for period of 5" seated on side of mat     Time  12    Period  Weeks    Status  New    Target Date  12/30/17      PT LONG TERM GOAL #5   Title  Perform static standing x 5 with bil. UE support with SBA for assistance with ADL's and for LE weight-bearing and stretching.    Baseline  able to stand 5" with Charlaine Dalton turned backwards     Time  12    Period  Weeks    Status  New    Target Date  12/30/17      PT LONG TERM GOAL #6   Title  Pt will be able to return to participation in therapeutic horseback riding at [place] for ongoing exercise program. (NEW MAINTAINANCE GOALS:  (Reassess 11-18-17 & 12-30-17)     Baseline  Not returned as of 09/29/17 - pt has had bil. hip surgeries    Time  12    Period  Weeks    Status  On-going    Target Date  12/30/17      PT LONG TERM GOAL #7   Title  Pt will maintain ability to transfer (stand pivot) from varying seated surfaces at max A level to decrease burden of care to single caregiver.   12-30-17 TARGET DATE    Baseline  mod to max A (variable) on 09/29/17    Time  12    Period  Weeks    Status  On-going      PT LONG TERM GOAL #8   Title  Pt will maintain sitting balance with RUE support at min A level x 5 minutes in order to maintain postural and trunk control without external support of w/c in order to indicate improved postural alignment.      Baseline  Pt able to sit at S to min A level with RUE support x 5 mins, requires increased support if RUE not able to support on mat 09/29/17; met 10-04-17    Status  Achieved      PT LONG TERM GOAL  #9   TITLE  Pt will maintain ability to ambulate with +2 max A for 20' in order to faciliate weight bearing to maintain bone density, decrease spasticity, and decrease the progression of  contractures.     Baseline  Pt able to ambulate x 25' with +2 max A 09/29/17; +2 mod to max assist with Stedy for 23' on 10-04-17    Status  Achieved      PT LONG TERM GOAL  #10   TITLE  Pt will maintain ability to stand with RUE support x 5 mins at min A level for LE stretching, for improved bowel, bladder and respiratory function.      Baseline  met with standing with Stedy turned around backwards on 10-04-17    Status  Achieved            Plan - 10/14/17 1500    Clinical Impression Statement  Skilled session focused on trunk flexibility into flexion and rotation to break up extensor tone prior to  standing and gait.  PT was able to get B shoes/AFO's donned during session prior to standing and gait.  Recommended leaving them on for 2 hours then remove and check skin.  They see Dr. Pricilla Holm next week therefore PT recommended knee extensors, plantar flexors and adductors to be botoxed.       Rehab Potential  Fair    Clinical Impairments Affecting Rehab Potential  severity of deficits with previous extensive therapies    PT Frequency  1x / week    PT Duration  12 weeks    PT Treatment/Interventions  ADLs/Self Care Home Management;Therapeutic exercise;Therapeutic activities;Functional mobility training;Gait training;Balance training;Neuromuscular re-education;Patient/family education;Orthotic Fit/Training;Passive range of motion    PT Next Visit Plan  forward weight shift/trunk lean, tone reduction, standing balance, gait with B spry step PLS AFOs - use Stedy ; try peanut physioball if assistance is present    PT Home Exercise Plan  caregiver and husband have previously been instructed in ROM, sitting balance, and transfer training    Consulted and Agree with Plan of Care  Patient;Family member/caregiver       Patient will benefit from skilled therapeutic intervention in order to improve the following deficits and impairments:  Abnormal gait, Decreased activity tolerance, Decreased balance,  Decreased cognition, Decreased coordination, Decreased mobility, Decreased range of motion, Decreased endurance, Hypomobility, Decreased strength, Impaired flexibility, Increased muscle spasms, Impaired UE functional use, Postural dysfunction, Pain  Visit Diagnosis: Abnormal posture  Muscle weakness (generalized)  Other symptoms and signs involving the nervous system  Unsteadiness on feet  Other abnormalities of gait and mobility  Stiffness of left hip, not elsewhere classified  Stiffness of right hip, not elsewhere classified     Problem List Patient Active Problem List   Diagnosis Date Noted  . Coagulase negative Staphylococcus bacteremia 02/09/2017  . Abdominal distension   . Chronic mucus hypersecretion, respiratory 12/31/2016  . Severe protein-calorie malnutrition [name]: less than 60% of standard weight) ([place]) 12/31/2016  . Acute urinary retention 12/31/2016  . Constipation 12/31/2016  . Shortness of breath   . Respiratory distress   . Pneumonia 12/05/2016  . Acute respiratory failure ([place]) 12/05/2016  . Pseudomonas urinary tract infection 10/27/2016  . Severe muscle deconditioning 09/30/2016  . Pressure injury of skin 09/28/2016  . Respiratory failure ([place])   . Tracheobronchitis   . Urinary retention   . PEG (percutaneous endoscopic gastrostomy) status ([place])   . Benign essential HTN   . Hyponatremia   . Hypokalemia   . Reactive hypertension   . Respiratory insufficiency 09/24/2016  . Malignant neoplasm of upper-inner quadrant of left breast in female, estrogen receptor positive ([place]) 07/14/2016  . Heterotopic ossification of bone 03/10/2016  . Acute on chronic respiratory failure with hypoxia ([place])   . Acute hypoxemic respiratory failure ([place])   . Acute blood loss anemia   . Atelectasis 02/04/2016  . Debility 02/03/2016  . Pneumonia of both lower lobes due to Pseudomonas species ([place])   . Pneumonia of both lower lobes due to methicillin susceptible  Staphylococcus aureus (MSSA) ([place])   . Acute respiratory failure with hypoxia ([place])   . Acute encephalopathy   . Seizures ([place])   . Sepsis ([place])   . Hypoxia   . Pain   . Chronic respiratory failure ([place]) 12/28/2015  . Acute tracheobronchitis 12/24/2015  . Tracheostomy tube present ([place])   . Tracheal stenosis   . Chronic respiratory failure with hypoxia ([place])   . Spastic tetraplegia ([place]) 10/20/2015  . Tracheostomy status ([place])  09/26/2015  . Allergic rhinitis 09/26/2015  . 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 Movement disorder 09/22/2015  . Encephalitis 09/22/2015  . Chest pain 02/07/2014  . Premature atrial contractions 01/13/2014  . Abnormality of gait 12/04/2013  . Hyperlipidemia 12/21/2011  . Cardiovascular risk factor 12/21/2011  . Bunion 01/31/2008  . Metatarsalgia of both feet 09/20/2007  . FLAT FOOT 09/20/2007  . HALLUX RIGIDUS, ACQUIRED 09/20/2007    Cameron Sprang, PT, MPT [facility] [address] [place], [place], [address] Phone: [phone]   Fax:  [phone] 10/14/17, 3:17 PM  Name: Judy Spears MRN: [id number] Date of Birth: 10-21-1950

## 2017-10-17 ENCOUNTER — Ambulatory Visit: Payer: Medicare Other | Admitting: Occupational Therapy

## 2017-10-17 ENCOUNTER — Encounter: Payer: Self-pay | Admitting: Occupational Therapy

## 2017-10-17 DIAGNOSIS — R2681 Unsteadiness on feet: Secondary | ICD-10-CM

## 2017-10-17 DIAGNOSIS — M25611 Stiffness of right shoulder, not elsewhere classified: Secondary | ICD-10-CM

## 2017-10-17 DIAGNOSIS — R293 Abnormal posture: Secondary | ICD-10-CM

## 2017-10-17 DIAGNOSIS — M6281 Muscle weakness (generalized): Secondary | ICD-10-CM

## 2017-10-17 DIAGNOSIS — R2689 Other abnormalities of gait and mobility: Secondary | ICD-10-CM | POA: Diagnosis not present

## 2017-10-17 DIAGNOSIS — M25622 Stiffness of left elbow, not elsewhere classified: Secondary | ICD-10-CM

## 2017-10-17 DIAGNOSIS — R29818 Other symptoms and signs involving the nervous system: Secondary | ICD-10-CM

## 2017-10-17 DIAGNOSIS — R41844 Frontal lobe and executive function deficit: Secondary | ICD-10-CM

## 2017-10-17 DIAGNOSIS — M25612 Stiffness of left shoulder, not elsewhere classified: Secondary | ICD-10-CM

## 2017-10-17 NOTE — Therapy (Signed)
[facility] [address] [place], [place], [address] Phone: [phone]   Fax:  [phone]  Occupational Therapy Treatment  Patient Details  Name: Judy Spears MRN: [id number] Date of Birth: Nov 03, 1950 Referring Provider: Dr. Levada Schilling   Encounter Date: 10/17/2017  OT End of Session - 10/17/17 1640    Visit Number  26    Number of Visits  44    Date for OT Re-Evaluation  12/20/17   reassess 30 days on 10/25/2017   Authorization Type  medicare maintenance program - pt to be reassessed every 30 days per Medicare guidelines  Will need PN every 10th visit    Authorization Time Period  90 days - 12/20/2017    Authorization - Visit Number  69    Authorization - Number of Visits  30    OT Start Time  6546    OT Stop Time  1400    OT Time Calculation (min)  43 min    Activity Tolerance  Patient tolerated treatment well       Past Medical History:  Diagnosis Date  . Arthritis    lt great toe  . Complication of anesthesia    pt has had headaches post op-did advise to hydra well preop  . Hair loss    right-sided  . History of exercise stress test    a. ETT (12/15) with 12:00 exercise, no ST changes, occasional PACs.   . Hyperlipidemia   . Insomnia   . Migraine headache   . Movement disorder    resltess in left legs  . Postmenopausal   . 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 Premature atrial contractions    a. Holter (12/15) with 8% PACs, no atrial runs or atrial fibrillation.     Past Surgical History:  Procedure Laterality Date  . BREAST BIOPSY  01/01/2011   Procedure: BREAST BIOPSY WITH NEEDLE LOCALIZATION;  Surgeon: Rolm Bookbinder, MD;  Location: [facility];  Service: General;  Laterality: Left;  left breast wire localization biopsy  . BREAST BIOPSY Right 07/27/2012  . BREAST BIOPSY Right 07/07/2016  . BREAST BIOPSY Left 06/30/2016  . cataracts right eye    . CESAREAN SECTION  1986   . HERNIA REPAIR  2000   RIH  . INTRAMEDULLARY (IM) NAIL INTERTROCHANTERIC Left 01/30/2017   Procedure: ORIF AFFIXUS NAIL;  Surgeon: Paralee Cancel, MD;  Location: [facility];  Service: Orthopedics;  Laterality: Left;  . IR REPLC GASTRO/COLONIC TUBE PERCUT W/FLUORO  09/29/2016  . opirectinal membrane peel    . PEG PLACEMENT    . RHINOPLASTY  1983  . TRACHEOSTOMY    . TRACHEOSTOMY TUBE PLACEMENT N/A 12/15/2016   Procedure: TRACHEOSTOMY CHANGE;  Surgeon: [name] Montane, MD;  Location: [facility];  Service: ENT;  Laterality: N/A;    There were no vitals filed for this visit.  Subjective Assessment - 10/17/17 1322    Subjective   Pt smiling today and no agitation    Patient is accompained by:  Family member   husband [name] caregiver [name]   Pertinent History  see epic snapshot - ABI/encephalitis/hypoxia from virus; recent hospitalization due to MRSA PNA     Patient Stated Goals  Pt unable to state.     Currently in Pain?  No/denies                   OT Treatments/Exercises (OP) - 10/17/17 0001      Neurological Re-education Exercises  Other Exercises 1  Neuro re ed in sitting to address trunk alignment and spasticity as well as trunk control for sitting balance.  Also addressed foward translation of weigth bearing in sitting and sidesitting with weight on RUE.  Progressed to sit to stand to address ability to move in and out of flexion extension patterns from sit to stand as well as dynamic standing balance and alignment in standing.  Also addressed active transfers from wheelchair to mat and back with emphasis on forward weight shift and pt's ability to activate lift of bottom during transfer.  Pt much better aligned in wheelchair at end of session and tone in LE's reduced.                   OT Long Term Goals - 10/17/17 1638      OT LONG TERM GOAL #1   Title  Pt will maintain ability to roll in bed during self care tasks with no more than max a x1 - reasssess 10/25/2017    Status   On-going      OT LONG TERM GOAL #2   Title  Pt will maintain the ability to use roll in shower chair with care giver assistance     Baseline  09/27/2017 pt continues to demonsrate adequate trunk control, sititng balance and decreased tone to use shower chair with care giver assistance.    Status  On-going      OT LONG TERM GOAL #3   Title  Pt will maintain ability to transfer to toilet with no more than max a x1 using Steady Assist - 10/06/2017    Baseline  09/27/2017 pt transfers with steady assist to toilet with max a x1    Status  On-going      OT LONG TERM GOAL #4   Title  Pt wil maintain ability for dressing at max a x1.     Baseline  09/27/2017  pt able to be assisted with dressing by 1 person only at max a level    Status  On-going      OT LONG TERM GOAL #5   Title  Pt will maintain ability to stand with max a x1 to for clothing mgmt with toileting.     Baseline  09/27/2017  pt able to stand with max a x 1 during toileting tasks    Status  On-going      OT LONG TERM GOAL #6   Title  Pt will maintain ability to participate in functional transfers, as well as stand to sit and sit to stand with no more than max a x1    Status  On-going      OT LONG TERM GOAL #7   Title  Pt will maintain functional mobility and trunk control to aid in her ability to clear secretions and assist with phonation to express basic needs/wants    Status  On-going            Plan - 10/17/17 1639    Clinical Impression Statement  Pt today demonstrating more moderate spasticity and less agitated today. Pt tolerated session well. Pt to have botox injection tomorrow for LE's    Occupational Profile and client history currently impacting functional performance  12/12 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    Occupational performance deficits (Please refer to evaluation for  details):  ADL's;IADL's;Rest and Sleep;Leisure;Social  Participation;Work    Neurosurgeon    Current Impairments/barriers affecting progress:  severity of multi system deficits, intermittent medical issues    OT Frequency  2x / week    OT Duration  12 weeks    OT Treatment/Interventions  Self-care/ADL training;Ultrasound;Moist Heat;Electrical Stimulation;DME and/or AE instruction;Neuromuscular education;Therapeutic exercise;Functional Mobility Training;Manual Therapy;Passive range of motion;Splinting;Therapeutic activities;Balance training;Patient/family education;Visual/perceptual remediation/compensation;Cognitive remediation/compensation    Plan  address rigidity and tone in trunk and extremeties, NMR trunk and LE's for transitional movements, static standing, sitting balance, sit to stand, stand to sit, stepping;    Consulted and Agree with Plan of Care  Patient;Family member/caregiver    Family Member Consulted  caregiver [name], husband [name]       Patient will benefit from skilled therapeutic intervention in order to improve the following deficits and impairments:  Decreased endurance, Decreased skin integrity, Impaired vision/preception, Improper body mechanics, Impaired perceived functional ability, Decreased knowledge of precautions, Cardiopulmonary status limiting activity, Decreased activity tolerance, Decreased knowledge of use of DME, Decreased strength, Impaired flexibility, Improper spinal/pelvic alignment, Impaired sensation, Difficulty walking, Decreased mobility, Decreased balance, Decreased cognition, Decreased range of motion, Impaired tone, Impaired UE functional use, Decreased safety awareness, Decreased coordination, Pain, Abnormal gait  Visit Diagnosis: Abnormal posture  Muscle weakness (generalized)  Other symptoms and signs involving the nervous system  Unsteadiness on feet  Frontal lobe and executive function deficit  Stiffness of right shoulder, not  elsewhere classified  Stiffness of left shoulder, not elsewhere classified  Stiffness of left elbow, not elsewhere classified    Problem List Patient Active Problem List   Diagnosis Date Noted  . Coagulase negative Staphylococcus bacteremia 02/09/2017  . Abdominal distension   . Chronic mucus hypersecretion, respiratory 12/31/2016  . Severe protein-calorie malnutrition [name]: less than 60% of standard weight) ([place]) 12/31/2016  . Acute urinary retention 12/31/2016  . Constipation 12/31/2016  . Shortness of breath   . Respiratory distress   . Pneumonia 12/05/2016  . Acute respiratory failure ([place]) 12/05/2016  . Pseudomonas urinary tract infection 10/27/2016  . Severe muscle deconditioning 09/30/2016  . Pressure injury of skin 09/28/2016  . Respiratory failure ([place])   . Tracheobronchitis   . Urinary retention   . PEG (percutaneous endoscopic gastrostomy) status ([place])   . Benign essential HTN   . Hyponatremia   . Hypokalemia   . Reactive hypertension   . Respiratory insufficiency 09/24/2016  . Malignant neoplasm of upper-inner quadrant of left breast in female, estrogen receptor positive ([place]) 07/14/2016  . Heterotopic ossification of bone 03/10/2016  . Acute on chronic respiratory failure with hypoxia ([place])   . Acute hypoxemic respiratory failure ([place])   . Acute blood loss anemia   . Atelectasis 02/04/2016  . Debility 02/03/2016  . Pneumonia of both lower lobes due to Pseudomonas species ([place])   . Pneumonia of both lower lobes due to methicillin susceptible Staphylococcus aureus (MSSA) ([place])   . Acute respiratory failure with hypoxia ([place])   . Acute encephalopathy   . Seizures ([place])   . Sepsis ([place])   . Hypoxia   . Pain   . Chronic respiratory failure ([place]) 12/28/2015  . Acute tracheobronchitis 12/24/2015  . Tracheostomy tube present ([place])   . Tracheal stenosis   . Chronic respiratory failure with hypoxia ([place])   . Spastic tetraplegia ([place]) 10/20/2015  .  Tracheostomy status ([place]) 09/26/2015  . Allergic rhinitis 09/26/2015  . 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 Movement disorder 09/22/2015  . Encephalitis 09/22/2015  . Chest  pain 02/07/2014  . Premature atrial contractions 01/13/2014  . Abnormality of gait 12/04/2013  . Hyperlipidemia 12/21/2011  . Cardiovascular risk factor 12/21/2011  . Bunion 01/31/2008  . Metatarsalgia of both feet 09/20/2007  . FLAT FOOT 09/20/2007  . HALLUX RIGIDUS, ACQUIRED 09/20/2007    Quay Burow, OTR/L 10/17/2017, 4:42 PM  [facility] [address] [place], [place], [address] Phone: [phone]   Fax:  [phone]  Name: Judy Spears MRN: [id number] Date of Birth: [birth year]

## 2017-10-20 ENCOUNTER — Encounter: Payer: Self-pay | Admitting: Occupational Therapy

## 2017-10-20 ENCOUNTER — Ambulatory Visit: Payer: Medicare Other | Admitting: Physical Therapy

## 2017-10-20 ENCOUNTER — Ambulatory Visit: Payer: Medicare Other | Admitting: Occupational Therapy

## 2017-10-20 DIAGNOSIS — R293 Abnormal posture: Secondary | ICD-10-CM

## 2017-10-20 DIAGNOSIS — M25622 Stiffness of left elbow, not elsewhere classified: Secondary | ICD-10-CM

## 2017-10-20 DIAGNOSIS — M25612 Stiffness of left shoulder, not elsewhere classified: Secondary | ICD-10-CM

## 2017-10-20 DIAGNOSIS — R2681 Unsteadiness on feet: Secondary | ICD-10-CM

## 2017-10-20 DIAGNOSIS — R41844 Frontal lobe and executive function deficit: Secondary | ICD-10-CM

## 2017-10-20 DIAGNOSIS — R29818 Other symptoms and signs involving the nervous system: Secondary | ICD-10-CM

## 2017-10-20 DIAGNOSIS — M25611 Stiffness of right shoulder, not elsewhere classified: Secondary | ICD-10-CM

## 2017-10-20 DIAGNOSIS — R2689 Other abnormalities of gait and mobility: Secondary | ICD-10-CM

## 2017-10-20 DIAGNOSIS — M6281 Muscle weakness (generalized): Secondary | ICD-10-CM

## 2017-10-20 NOTE — Therapy (Signed)
[facility] [address] [place], [place], [address] Phone: [phone]   Fax:  [phone]  Occupational Therapy Treatment  Patient Details  Name: Judy Spears MRN: [id number] Date of Birth: 05/03/1950 Referring Provider (Historical): Dr. [name]   Encounter Date: 10/20/2017  OT End of Session - 10/20/17 1608    Visit Number  27    Number of Visits  44    Date for OT Re-Evaluation  12/20/17    Authorization Type  medicare maintenance program - pt to be reassessed every 30 days per Medicare guidelines  Will need PN every 10th visit    Authorization Time Period  90 days - 12/20/2017    Authorization - Visit Number  12    Authorization - Number of Visits  30    OT Start Time  1402    OT Stop Time  1444    OT Time Calculation (min)  42 min    Activity Tolerance  Patient tolerated treatment well       Past Medical History:  Diagnosis Date  . Arthritis    lt great toe  . Complication of anesthesia    pt has had headaches post op-did advise to hydra well preop  . Hair loss    right-sided  . History of exercise stress test    a. ETT (12/15) with 12:00 exercise, no ST changes, occasional PACs.   . Hyperlipidemia   . Insomnia   . Migraine headache   . Movement disorder    resltess in left legs  . Postmenopausal   . 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 Premature atrial contractions    a. Holter (12/15) with 8% PACs, no atrial runs or atrial fibrillation.     Past Surgical History:  Procedure Laterality Date  . BREAST BIOPSY  01/01/2011   Procedure: BREAST BIOPSY WITH NEEDLE LOCALIZATION;  Surgeon: Rolm Bookbinder, MD;  Location: [facility];  Service: General;  Laterality: Left;  left breast wire localization biopsy  . BREAST BIOPSY Right 07/27/2012  . BREAST BIOPSY Right 07/07/2016  . BREAST BIOPSY Left 06/30/2016  . cataracts right eye    . CESAREAN SECTION  1986  . HERNIA REPAIR  2000    RIH  . INTRAMEDULLARY (IM) NAIL INTERTROCHANTERIC Left 01/30/2017   Procedure: ORIF AFFIXUS NAIL;  Surgeon: Paralee Cancel, MD;  Location: [facility];  Service: Orthopedics;  Laterality: Left;  . IR REPLC GASTRO/COLONIC TUBE PERCUT W/FLUORO  09/29/2016  . opirectinal membrane peel    . PEG PLACEMENT    . RHINOPLASTY  1983  . TRACHEOSTOMY    . TRACHEOSTOMY TUBE PLACEMENT N/A 12/15/2016   Procedure: TRACHEOSTOMY CHANGE;  Surgeon: [name] Montane, MD;  Location: [facility];  Service: ENT;  Laterality: N/A;    There were no vitals filed for this visit.  Subjective Assessment - 10/20/17 1557    Subjective   I like that (when foot plates were built up to bring knees and hips into full 90* of flexion in wheelchair    Patient is accompained by:  Family member   caregiver [name]   Pertinent History  see epic snapshot - ABI/encephalitis/hypoxia from virus; recent hospitalization due to MRSA PNA     Patient Stated Goals  Pt unable to state.     Currently in Pain?  No/denies                   OT Treatments/Exercises (OP) - 10/20/17 0001  ADLs   ADL Comments  Pt's caregiver reporting that she is having great difficulty getting pt's new AFO's on BLE's due to severe spasticity.  Assessed fit of AFO's with current shoes - pt may benefit from larger shoes - however even with positioning in wheelchair to promote decreased LE spasiticty, had difficulty donning R AFO - pt does not have full range at R ankle at this time to truly fit heel into AFO.  Able to don L AFO however pt immediately pushes into AFO and heel raises out of shoe and AFO while tilted in space in wheelchair.  PT to follow up with [facility] regarding current AFO's.       Neurological Re-education Exercises   Other Exercises 1  Educated caregiver [name] regarding different positions using knowledge of primitive reflexes while in wheelchair as well as supine and sidelying to assist in breaking up extensor tone. Caregiver able to verbalize  understanding.  Also temporarily built up foot plates as pt is not currently at 90* of hip flexion, knee flexion while in wheelchair as foot plates are too low.  Pt immediately stated she felt more comfortable in chair and discussed with caregiver not only will this assist in breaking up extensor tone, especially when used with tilting in space for brief periods during the day, but also will help to keep pt's hips back into wheelchair (pt tends to move into hip extension and slide forward which then results in LE extension and increased rigidity in trunk. [facility] to follow up with [facility] to discuss.                   OT Long Term Goals - 10/20/17 1606      OT LONG TERM GOAL #1   Title  Pt will maintain ability to roll in bed during self care tasks with no more than max a x1 - reasssess 10/25/2017    Status  On-going      OT LONG TERM GOAL #2   Title  Pt will maintain the ability to use roll in shower chair with care giver assistance     Baseline  09/27/2017 pt continues to demonsrate adequate trunk control, sititng balance and decreased tone to use shower chair with care giver assistance.    Status  On-going      OT LONG TERM GOAL #3   Title  Pt will maintain ability to transfer to toilet with no more than max a x1 using Steady Assist - 10/06/2017    Baseline  09/27/2017 pt transfers with steady assist to toilet with max a x1    Status  On-going      OT LONG TERM GOAL #4   Title  Pt wil maintain ability for dressing at max a x1.     Baseline  09/27/2017  pt able to be assisted with dressing by 1 person only at max a level    Status  On-going      OT LONG TERM GOAL #5   Title  Pt will maintain ability to stand with max a x1 to for clothing mgmt with toileting.     Baseline  09/27/2017  pt able to stand with max a x 1 during toileting tasks    Status  On-going      OT LONG TERM GOAL #6   Title  Pt will maintain ability to participate in functional transfers, as well as stand to sit  and sit to stand with no more than max  a x1    Status  On-going      OT LONG TERM GOAL #7   Title  Pt will maintain functional mobility and trunk control to aid in her ability to clear secretions and assist with phonation to express basic needs/wants    Status  On-going            Plan - 10/20/17 1607    Clinical Impression Statement  Pt received botox injections to LE's yesterday with hopes of decreasing spasticity to allow for easier transfers, standing and self care activities.     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    Occupational performance deficits (Please refer to evaluation for details):  ADL's;IADL's;Rest and Sleep;Leisure;Social Participation;Work    Physicist, medical    Current Impairments/barriers affecting progress:  severity of multi system deficits, intermittent medical issues    OT Frequency  2x / week    OT Duration  12 weeks    OT Treatment/Interventions  Self-care/ADL training;Ultrasound;Moist Heat;Electrical Stimulation;DME and/or AE instruction;Neuromuscular education;Therapeutic exercise;Functional Mobility Training;Manual Therapy;Passive range of motion;Splinting;Therapeutic activities;Balance training;Patient/family education;Visual/perceptual remediation/compensation;Cognitive remediation/compensation    Plan  address rigidity and tone in trunk and extremeties, NMR trunk and LE's for transitional movements, static standing, sitting balance, sit to stand, stand to sit, stepping;    Consulted and Agree with Plan of Care  Patient;Family member/caregiver    Family Member Consulted  caregiver [name]       Patient will benefit from skilled therapeutic intervention in order to improve the following deficits and impairments:  Decreased  endurance, Decreased skin integrity, Impaired vision/preception, Improper body mechanics, Impaired perceived functional ability, Decreased knowledge of precautions, Cardiopulmonary status limiting activity, Decreased activity tolerance, Decreased knowledge of use of DME, Decreased strength, Impaired flexibility, Improper spinal/pelvic alignment, Impaired sensation, Difficulty walking, Decreased mobility, Decreased balance, Decreased cognition, Decreased range of motion, Impaired tone, Impaired UE functional use, Decreased safety awareness, Decreased coordination, Pain, Abnormal gait  Visit Diagnosis: Abnormal posture  Muscle weakness (generalized)  Other symptoms and signs involving the nervous system  Unsteadiness on feet  Frontal lobe and executive function deficit  Stiffness of right shoulder, not elsewhere classified  Stiffness of left shoulder, not elsewhere classified  Stiffness of left elbow, not elsewhere classified    Problem List Patient Active Problem List   Diagnosis Date Noted  . Coagulase negative Staphylococcus bacteremia 02/09/2017  . Abdominal distension   . Chronic mucus hypersecretion, respiratory 12/31/2016  . Severe protein-calorie malnutrition [name]: less than 60% of standard weight) ([place]) 12/31/2016  . Acute urinary retention 12/31/2016  . Constipation 12/31/2016  . Shortness of breath   . Respiratory distress   . Pneumonia 12/05/2016  . Acute respiratory failure ([place]) 12/05/2016  . Pseudomonas urinary tract infection 10/27/2016  . Severe muscle deconditioning 09/30/2016  . Pressure injury of skin 09/28/2016  . Respiratory failure ([place])   . Tracheobronchitis   . Urinary retention   . PEG (percutaneous endoscopic gastrostomy) status ([place])   . Benign essential HTN   . Hyponatremia   . Hypokalemia   . Reactive hypertension   . Respiratory insufficiency 09/24/2016  . Malignant neoplasm of upper-inner quadrant of left breast in female, estrogen  receptor positive ([place] [facility]) 07/14/2016  . Heterotopic ossification of bone 03/10/2016  . Acute on chronic respiratory failure with hypoxia ([place])   .  Acute hypoxemic respiratory failure ([place])   . Acute blood loss anemia   . Atelectasis 02/04/2016  . Debility 02/03/2016  . Pneumonia of both lower lobes due to Pseudomonas species ([place])   . Pneumonia of both lower lobes due to methicillin susceptible Staphylococcus aureus (MSSA) ([place])   . Acute respiratory failure with hypoxia ([place])   . Acute encephalopathy   . Seizures ([place])   . Sepsis ([place])   . Hypoxia   . Pain   . Chronic respiratory failure ([place]) 12/28/2015  . Acute tracheobronchitis 12/24/2015  . Tracheostomy tube present ([place])   . Tracheal stenosis   . Chronic respiratory failure with hypoxia ([place])   . Spastic tetraplegia ([place]) 10/20/2015  . Tracheostomy status ([place]) 09/26/2015  . Allergic rhinitis 09/26/2015  . 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 Movement disorder 09/22/2015  . Encephalitis 09/22/2015  . Chest pain 02/07/2014  . Premature atrial contractions 01/13/2014  . Abnormality of gait 12/04/2013  . Hyperlipidemia 12/21/2011  . Cardiovascular risk factor 12/21/2011  . Bunion 01/31/2008  . Metatarsalgia of both feet 09/20/2007  . FLAT FOOT 09/20/2007  . HALLUX RIGIDUS, ACQUIRED 09/20/2007    Quay Burow, OTR/L 10/20/2017, 4:09 PM  [facility] [address] [place], [place], [address] Phone: [phone]   Fax:  [phone]  Name: Judy Spears MRN: [id number] Date of Birth: 1950-06-03

## 2017-10-21 NOTE — Therapy (Signed)
[facility] [address] [place], [place], [address] Phone: [phone]   Fax:  [phone]  Physical Therapy Treatment  Patient Details  Name: Judy Spears MRN: [id number] Date of Birth: 03/27/1950 Referring Provider (PT): Dr. John Aguilar   Encounter Date: 10/20/2017  PT End of Session - 10/21/17 1435    Visit Number  28    Number of Visits  38    Date for PT Re-Evaluation  12/30/17    Authorization Type  [employer] Medicare    Authorization Time Period  10-04-17 - 12-30-17    PT Start Time  1317    PT Stop Time  1401    PT Time Calculation (min)  44 min       Past Medical History:  Diagnosis Date  . Arthritis    lt great toe  . Complication of anesthesia    pt has had headaches post op-did advise to hydra well preop  . Hair loss    right-sided  . History of exercise stress test    a. ETT (12/15) with 12:00 exercise, no ST changes, occasional PACs.   . Hyperlipidemia   . Insomnia   . Migraine headache   . Movement disorder    resltess in left legs  . Postmenopausal   . 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 Premature atrial contractions    a. Holter (12/15) with 8% PACs, no atrial runs or atrial fibrillation.     Past Surgical History:  Procedure Laterality Date  . BREAST BIOPSY  01/01/2011   Procedure: BREAST BIOPSY WITH NEEDLE LOCALIZATION;  Surgeon: Matthew Wakefield, MD;  Location: [facility];  Service: General;  Laterality: Left;  left breast wire localization biopsy  . BREAST BIOPSY Right 07/27/2012  . BREAST BIOPSY Right 07/07/2016  . BREAST BIOPSY Left 06/30/2016  . cataracts right eye    . CESAREAN SECTION  1986  . HERNIA REPAIR  2000   RIH  . INTRAMEDULLARY (IM) NAIL INTERTROCHANTERIC Left 01/30/2017   Procedure: ORIF AFFIXUS NAIL;  Surgeon: Olin, Matthew, MD;  Location[facility];  Service: Orthopedics;  Laterality: Left;  . IR REPLC GASTRO/COLONIC TUBE PERCUT W/FLUORO  09/29/2016  .  opirectinal membrane peel    . PEG PLACEMENT    . RHINOPLASTY  1983  . TRACHEOSTOMY    . TRACHEOSTOMY TUBE PLACEMENT N/A 12/15/2016   Procedure: TRACHEOSTOMY CHANGE;  Surgeon: Byers, John, MD;  Location: [facility];  Service: ENT;  Laterality: N/A;    There were no vitals filed for this visit.  Subjective Assessment - 10/21/17 1409    Subjective  Pt accompanied to PT by caregiver, [name]; [name] reports pt had Botox injections yesterday (on Wed. by Dr. Aguilar) in bil. quads, hamstrings, hip adductors and plantarflexors; [name] also reports they have started using CBD oil which has really helped in reducing the tone    Patient is accompained by:  --   caregiver [name]   Pertinent History  [place] viral encephalitis; anoxic BI:  ORIF Lt hip 02-09-17;  Rt THR  04-12-17    Patient Stated Goals  improve mobility; walk    Currently in Pain?  No/denies        Pt transferred sit to stand with mod assist from wheelchair - max assist needed for stand pivot transfer to mat Max assist for sit to supine transfer   TherEx: Performed AAROM/PROM/stretching for bil. LE's - knee to chest x 10 reps each leg; gentle trunk rotation to   Rt and Lt sides Hip abduction for adductor stretching with 10 sec hold Hip abduction in hooklying x 5 reps; supine exercises were discontinued with pt "gurgling" with secretions in supine - pt was Transferred from supine to sitting so that secretions could be cleared - [name] provided pt with water and swabbed pt's mouth  To assist in clearing secretions  Gait:  Pt's AFO's (Spry AFO's) attempted to be donned - several attempts made to place each foot in sneaker with AFO inside shoe - unable to don AFO's, therefore, gait training was attempted without AFO's with sneakers due to having the leather toe cap on shoes; turned Stedy backwards as This is device that would be used at home for assistance with ambulation [name] assisted - attempted gait training approx. 10' only due to difficulty  with pt needing max assist with RLE advancement and pt Leaning anteriorly and laterally flexed toward left side  Pt transferred standing to sit with mod assist - needed cues to flex trunk/hips in preparation for return to seated position                          PT Short Term Goals - 06/10/17 1305      PT SHORT TERM GOAL #1   Title  Perform sit to/from stand from wheelchair or mat with +1 mod assist.    Baseline  06/09/16: met today    Status  Achieved      PT SHORT TERM GOAL #2   Title  Pt will perform sit to stand from wheelchair to RW with max assist for pre-gait skill.    Baseline  06/09/17: met today    Status  Achieved      PT SHORT TERM GOAL #3   Title  Pt will transfer supine to sit with max assist.    Baseline  06/09/17: met today, needs increased time to assist    Status  Achieved      PT SHORT TERM GOAL #4   Title  Pt will sit with RUE support with min to mod assist unsupported on side of mat for at least 3" to demo improved trunk strength and core stabilization.    Baseline  06/09/17: met today     Status  Achieved      PT SHORT TERM GOAL #5   Title  Pt will amb. 15' with appropriate assistive device with max assist +1    Baseline  06/09/17: pt now needs mod/max assist of 2 people for gait using modified 3 muskateer assist and blue rocker AFO to right LE    Status  Partially Met        PT Long Term Goals - 10/05/17 1042      PT LONG TERM GOAL #3   Title  Pt will amb. 40' with appropriate assistive device with +1 max assist.    Baseline  amb. 23' with Stedy with +2 mod assist with bil. AFO's on 10-04-17    Time  12    Period  Weeks    Status  New    Target Date  12/30/17      PT LONG TERM GOAL #4   Title  Pt will maintain sitting balance with RUE support for at least 5" with SBA for increased independence with ADL's.    Baseline  min assist needed on 10-04-17 for period of 5" seated on side of mat     Time  12      Period  Weeks    Status   New    Target Date  12/30/17      PT LONG TERM GOAL #5   Title  Perform static standing x 5 with bil. UE support with SBA for assistance with ADL's and for LE weight-bearing and stretching.    Baseline  able to stand 5" with Charlaine Dalton turned backwards     Time  12    Period  Weeks    Status  New    Target Date  12/30/17      PT LONG TERM GOAL #6   Title  Pt will be able to return to participation in therapeutic horseback riding at [facility] for ongoing exercise program. (NEW MAINTAINANCE GOALS:  (Reassess 11-18-17 & 12-30-17)     Baseline  Not returned as of 09/29/17 - pt has had bil. hip surgeries    Time  12    Period  Weeks    Status  On-going    Target Date  12/30/17      PT LONG TERM GOAL #7   Title  Pt will maintain ability to transfer (stand pivot) from varying seated surfaces at max A level to decrease burden of care to single caregiver.   12-30-17 TARGET DATE    Baseline  mod to max A (variable) on 09/29/17    Time  12    Period  Weeks    Status  On-going      PT LONG TERM GOAL #8   Title  Pt will maintain sitting balance with RUE support at min A level x 5 minutes in order to maintain postural and trunk control without external support of w/c in order to indicate improved postural alignment.      Baseline  Pt able to sit at S to min A level with RUE support x 5 mins, requires increased support if RUE not able to support on mat 09/29/17; met 10-04-17    Status  Achieved      PT LONG TERM GOAL  #9   TITLE  Pt will maintain ability to ambulate with +2 max A for 20' in order to faciliate weight bearing to maintain bone density, decrease spasticity, and decrease the progression of contractures.     Baseline  Pt able to ambulate x 25' with +2 max A 09/29/17; +2 mod to max assist with Stedy for 23' on 10-04-17    Status  Achieved      PT LONG TERM GOAL  #10   TITLE  Pt will maintain ability to stand with RUE support x 5 mins at min A level for LE stretching, for improved bowel, bladder and  respiratory function.      Baseline  met with standing with Stedy turned around backwards on 10-04-17    Status  Achieved            Plan - 10/21/17 1435    Clinical Impression Statement  Pt continues to have significant extensor tone/spasticity; unable to don pt's Spry AFO's on LE's; gait training attempted with use of Stedy turned backwards and with caregiver's assistance due to lack of staff availability to assist during tx session.  Pt unable to stand errect - leaned  anteriorly and toward left side and unable to advance RLE during swing through.  Trilaed toe off AFO at end of session on LLE - no difficulty donning toe off AFO on LLE.     Rehab Potential  Fair    Clinical Impairments Affecting  Rehab Potential  severity of deficits with previous extensive therapies    PT Frequency  1x / week    PT Duration  12 weeks    PT Treatment/Interventions  ADLs/Self Care Home Management;Therapeutic exercise;Therapeutic activities;Functional mobility training;Gait training;Balance training;Neuromuscular re-education;Patient/family education;Orthotic Fit/Training;Passive range of motion    PT Next Visit Plan  staff blocked to assist/trial gait with use of unweighting system next session - try toe off AFO's?    PT Home Exercise Plan  caregiver and husband have previously been instructed in ROM, sitting balance, and transfer training    Consulted and Agree with Plan of Care  Patient;Other (Comment)       Patient will benefit from skilled therapeutic intervention in order to improve the following deficits and impairments:  Abnormal gait, Decreased activity tolerance, Decreased balance, Decreased cognition, Decreased coordination, Decreased mobility, Decreased range of motion, Decreased endurance, Hypomobility, Decreased strength, Impaired flexibility, Increased muscle spasms, Impaired UE functional use, Postural dysfunction, Pain  Visit Diagnosis: Other symptoms and signs involving the nervous  system  Other abnormalities of gait and mobility     Problem List Patient Active Problem List   Diagnosis Date Noted  . Coagulase negative Staphylococcus bacteremia 02/09/2017  . Abdominal distension   . Chronic mucus hypersecretion, respiratory 12/31/2016  . Severe protein-calorie malnutrition [name]: less than 60% of standard weight) ([place]) 12/31/2016  . Acute urinary retention 12/31/2016  . Constipation 12/31/2016  . Shortness of breath   . Respiratory distress   . Pneumonia 12/05/2016  . Acute respiratory failure ([place]) 12/05/2016  . Pseudomonas urinary tract infection 10/27/2016  . Severe muscle deconditioning 09/30/2016  . Pressure injury of skin 09/28/2016  . Respiratory failure ([place])   . Tracheobronchitis   . Urinary retention   . PEG (percutaneous endoscopic gastrostomy) status ([place])   . Benign essential HTN   . Hyponatremia   . Hypokalemia   . Reactive hypertension   . Respiratory insufficiency 09/24/2016  . Malignant neoplasm of upper-inner quadrant of left breast in female, estrogen receptor positive ([place]) 07/14/2016  . Heterotopic ossification of bone 03/10/2016  . Acute on chronic respiratory failure with hypoxia ([place])   . Acute hypoxemic respiratory failure ([place])   . Acute blood loss anemia   . Atelectasis 02/04/2016  . Debility 02/03/2016  . Pneumonia of both lower lobes due to Pseudomonas species ([place])   . Pneumonia of both lower lobes due to methicillin susceptible Staphylococcus aureus (MSSA) ([place])   . Acute respiratory failure with hypoxia ([place])   . Acute encephalopathy   . Seizures ([place])   . Sepsis ([place])   . Hypoxia   . Pain   . Chronic respiratory failure ([place]) 12/28/2015  . Acute tracheobronchitis 12/24/2015  . Tracheostomy tube present ([place])   . Tracheal stenosis   . Chronic respiratory failure with hypoxia ([place])   . Spastic tetraplegia ([place]) 10/20/2015  . Tracheostomy status ([place]) 09/26/2015  . Allergic rhinitis 09/26/2015  . 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 Movement disorder 09/22/2015  . Encephalitis 09/22/2015  . Chest pain 02/07/2014  . Premature atrial contractions 01/13/2014  . Abnormality of gait 12/04/2013  . Hyperlipidemia 12/21/2011  . Cardiovascular risk factor 12/21/2011  . Bunion 01/31/2008  . Metatarsalgia of both feet 09/20/2007  . FLAT FOOT 09/20/2007  . HALLUX RIGIDUS, ACQUIRED 09/20/2007    Alda Lea, PT 10/21/2017, 2:42 PM  [facility] [address] [place], [place], [address] Phone: [phone]   Fax:  [phone]  Name: Judy Spears MRN: [id number] Date of Birth: 05-Jan-1951

## 2017-10-25 ENCOUNTER — Encounter: Payer: Self-pay | Admitting: Occupational Therapy

## 2017-10-25 ENCOUNTER — Ambulatory Visit: Payer: Medicare Other | Attending: Physician Assistant | Admitting: Occupational Therapy

## 2017-10-25 DIAGNOSIS — R2689 Other abnormalities of gait and mobility: Secondary | ICD-10-CM | POA: Diagnosis present

## 2017-10-25 DIAGNOSIS — R293 Abnormal posture: Secondary | ICD-10-CM | POA: Insufficient documentation

## 2017-10-25 DIAGNOSIS — M25652 Stiffness of left hip, not elsewhere classified: Secondary | ICD-10-CM | POA: Diagnosis present

## 2017-10-25 DIAGNOSIS — M25651 Stiffness of right hip, not elsewhere classified: Secondary | ICD-10-CM | POA: Insufficient documentation

## 2017-10-25 DIAGNOSIS — M25612 Stiffness of left shoulder, not elsewhere classified: Secondary | ICD-10-CM | POA: Insufficient documentation

## 2017-10-25 DIAGNOSIS — R2681 Unsteadiness on feet: Secondary | ICD-10-CM

## 2017-10-25 DIAGNOSIS — M25622 Stiffness of left elbow, not elsewhere classified: Secondary | ICD-10-CM | POA: Diagnosis present

## 2017-10-25 DIAGNOSIS — R29818 Other symptoms and signs involving the nervous system: Secondary | ICD-10-CM | POA: Insufficient documentation

## 2017-10-25 DIAGNOSIS — R41844 Frontal lobe and executive function deficit: Secondary | ICD-10-CM | POA: Diagnosis present

## 2017-10-25 DIAGNOSIS — M6281 Muscle weakness (generalized): Secondary | ICD-10-CM

## 2017-10-25 DIAGNOSIS — R278 Other lack of coordination: Secondary | ICD-10-CM | POA: Insufficient documentation

## 2017-10-25 DIAGNOSIS — M25611 Stiffness of right shoulder, not elsewhere classified: Secondary | ICD-10-CM | POA: Diagnosis present

## 2017-10-25 NOTE — Therapy (Signed)
[facility] [address] [place], [place], [address] Phone: [phone]   Fax:  [phone]  Occupational Therapy Treatment  Patient Details  Name: Judy Spears MRN: [id number] Date of Birth: 05/02/1950 Referring Provider (OT): Dr. [name]   Encounter Date: 10/25/2017  OT End of Session - 10/25/17 1649    Visit Number  28    Number of Visits  44    Date for OT Re-Evaluation  12/20/17    Authorization Type  medicare maintenance program - pt to be reassessed every 30 days per Medicare guidelines  Will need PN every 10th visit    Authorization Time Period  90 days - 12/20/2017    Authorization - Visit Number  57    Authorization - Number of Visits  30    OT Start Time  6270    OT Stop Time  1445    OT Time Calculation (min)  43 min    Activity Tolerance  Patient tolerated treatment well       Past Medical History:  Diagnosis Date  . Arthritis    lt great toe  . Complication of anesthesia    pt has had headaches post op-did advise to hydra well preop  . Hair loss    right-sided  . History of exercise stress test    a. ETT (12/15) with 12:00 exercise, no ST changes, occasional PACs.   . Hyperlipidemia   . Insomnia   . Migraine headache   . Movement disorder    resltess in left legs  . Postmenopausal   . 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 Premature atrial contractions    a. Holter (12/15) with 8% PACs, no atrial runs or atrial fibrillation.     Past Surgical History:  Procedure Laterality Date  . BREAST BIOPSY  01/01/2011   Procedure: BREAST BIOPSY WITH NEEDLE LOCALIZATION;  Surgeon: Rolm Bookbinder, MD;  Location: [facility];  Service: General;  Laterality: Left;  left breast wire localization biopsy  . BREAST BIOPSY Right 07/27/2012  . BREAST BIOPSY Right 07/07/2016  . BREAST BIOPSY Left 06/30/2016  . cataracts right eye    . CESAREAN SECTION  1986  . HERNIA REPAIR  2000   RIH  .  INTRAMEDULLARY (IM) NAIL INTERTROCHANTERIC Left 01/30/2017   Procedure: ORIF AFFIXUS NAIL;  Surgeon: Paralee Cancel, MD;  Location: [facility];  Service: Orthopedics;  Laterality: Left;  . IR REPLC GASTRO/COLONIC TUBE PERCUT W/FLUORO  09/29/2016  . opirectinal membrane peel    . PEG PLACEMENT    . RHINOPLASTY  1983  . TRACHEOSTOMY    . TRACHEOSTOMY TUBE PLACEMENT N/A 12/15/2016   Procedure: TRACHEOSTOMY CHANGE;  Surgeon: [name] Montane, MD;  Location: [facility];  Service: ENT;  Laterality: N/A;    There were no vitals filed for this visit.  Subjective Assessment - 10/25/17 1407    Subjective   I dont really know what the wearing schedule is for that new splint (made by orthopedist office for R hand)    Patient is accompained by:  Family member   caregiver [name], husband [name]   Pertinent History  see epic snapshot - ABI/encephalitis/hypoxia from virus; recent hospitalization due to MRSA PNA     Patient Stated Goals  Pt unable to state.     Currently in Pain?  No/denies                   OT Treatments/Exercises (OP) - 10/25/17 0001  Splinting   Splinting  Pt was recently seen by orthopedic hand/UE specialist who recommended off the shelf splint for R hand. Husband ordered splint and then MD had pt seen by therapist in his office to determine fit - therapist did not like the way splint fit and therefore made a custom dorsal splint.  When asked purpose of splint and wearing schedule, caregiver [name] stated "She told me to start with an hour a day and build up to a few hours - I don't know if she meant to wear it at night or during the day."  Caregiver stated she thought the splint was to work on passive stretch to fingers in right hand while maintaining arch however splint does not have structure to address finger extension.  After long discussion with caregiver and husband, husband wishes for this therapist to make a custom splint for R hand (modified resting hand splint) - pt had custom splint  prior however this broke when pt was in inpt rehab.  Also discussed splinting option for L hand (pt had 2 custom splints for both day and night  however this went missing when pt was in the hospital) .  Will begin fabrication of custom splints next visit.                OT Short Term Goals - 10/12/17 0835      OT SHORT TERM GOAL #6   Title  Pt will maintain ability to roll in bed during self care tasks with no more than max a x1 - 08/17/2017    Status  Achieved      OT SHORT TERM GOAL #7   Title  Pt will maintain the ability to use roll in shower chair with care giver assistance     Status  On-going        OT Long Term Goals - 10/25/17 1645      OT LONG TERM GOAL #1   Title  Pt will maintain ability to roll in bed during self care tasks with no more than max a x1 - reasssess 10/25/2017    Baseline  10/25/2017 pt able to maintain rolling with max a x1     Status  On-going      OT LONG TERM GOAL #2   Title  Pt will maintain the ability to use roll in shower chair with care giver assistance     Baseline  10/25/2017  pt continues to demonsrate adequate trunk control, sititng balance and decreased tone to use shower chair with care giver assistance.    Status  On-going      OT LONG TERM GOAL #3   Title  Pt will maintain ability to transfer to toilet with no more than max a x1 using Steady Assist - 10/06/2017    Baseline  10/25/2017 pt transfers with steady assist to toilet with max a x1    Status  On-going      OT LONG TERM GOAL #4   Title  Pt wil maintain ability for dressing at max a x1.     Baseline  10/25/2017  pt able to be assisted with dressing by 1 person only at max a level    Status  On-going      OT LONG TERM GOAL #5   Title  Pt will maintain ability to stand with max a x1 to for clothing mgmt with toileting.     Baseline  10/25/2017 pt able to stand with max a x  1 during toileting tasks    Status  On-going      OT LONG TERM GOAL #6   Title  Pt will maintain ability  to participate in functional transfers, as well as stand to sit and sit to stand with no more than max a x1    Status  On-going   10/25/2017 pt able to transfer and complete sit to stand and stand to sit with max a x1     OT LONG TERM GOAL #7   Title  Pt will maintain functional mobility and trunk control to aid in her ability to clear secretions and assist with phonation to express basic needs/wants    Status  On-going   10/25/2017  pt has been able to maintain secretions, has had no upper respiratory issues and is able to phonate basic needs/wants           Plan - 10/25/17 1648    Clinical Impression Statement  Pt continues to maintain current status (see goal updates for today).  Will begin custom fabrication of splints for both hands next session.     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    Occupational performance deficits (Please refer to evaluation for details):  ADL's;IADL's;Rest and Sleep;Leisure;Social Participation;Work    Physicist, medical    Current Impairments/barriers affecting progress:  severity of multi system deficits, intermittent medical issues    OT Frequency  2x / week    OT Duration  12 weeks    OT Treatment/Interventions  Self-care/ADL training;Ultrasound;Moist Heat;Electrical Stimulation;DME and/or AE instruction;Neuromuscular education;Therapeutic exercise;Functional Mobility Training;Manual Therapy;Passive range of motion;Splinting;Therapeutic activities;Balance training;Patient/family education;Visual/perceptual remediation/compensation;Cognitive remediation/compensation    Plan  address rigidity and tone in trunk and extremeties, NMR trunk and LE's for transitional movements, static standing, sitting balance, sit to stand, stand to sit,  stepping;    Consulted and Agree with Plan of Care  Patient;Family member/caregiver    Family Member Consulted  caregiver [name]       Patient will benefit from skilled therapeutic intervention in order to improve the following deficits and impairments:  Decreased endurance, Decreased skin integrity, Impaired vision/preception, Improper body mechanics, Impaired perceived functional ability, Decreased knowledge of precautions, Cardiopulmonary status limiting activity, Decreased activity tolerance, Decreased knowledge of use of DME, Decreased strength, Impaired flexibility, Improper spinal/pelvic alignment, Impaired sensation, Difficulty walking, Decreased mobility, Decreased balance, Decreased cognition, Decreased range of motion, Impaired tone, Impaired UE functional use, Decreased safety awareness, Decreased coordination, Pain, Abnormal gait  Visit Diagnosis: Abnormal posture  Muscle weakness (generalized)  Other symptoms and signs involving the nervous system  Unsteadiness on feet  Frontal lobe and executive function deficit  Stiffness of right shoulder, not elsewhere classified  Stiffness of left shoulder, not elsewhere classified  Stiffness of left elbow, not elsewhere classified    Problem List Patient Active Problem List   Diagnosis Date Noted  . Coagulase negative Staphylococcus bacteremia 02/09/2017  . Abdominal distension   . Chronic mucus hypersecretion, respiratory 12/31/2016  . Severe protein-calorie malnutrition [name]: less than 60% of standard weight) ([place]) 12/31/2016  . Acute urinary retention 12/31/2016  . Constipation 12/31/2016  . Shortness of breath   . Respiratory distress   . Pneumonia 12/05/2016  . Acute respiratory failure ([place]) 12/05/2016  . Pseudomonas urinary tract infection 10/27/2016  . Severe muscle deconditioning 09/30/2016  .  Pressure injury of skin 09/28/2016  . Respiratory failure ([place])   . Tracheobronchitis   . Urinary retention   . PEG  (percutaneous endoscopic gastrostomy) status ([place])   . Benign essential HTN   . Hyponatremia   . Hypokalemia   . Reactive hypertension   . Respiratory insufficiency 09/24/2016  . Malignant neoplasm of upper-inner quadrant of left breast in female, estrogen receptor positive ([place]) 07/14/2016  . Heterotopic ossification of bone 03/10/2016  . Acute on chronic respiratory failure with hypoxia ([facility])   . Acute hypoxemic respiratory failure ([facility])   . Acute blood loss anemia   . Atelectasis 02/04/2016  . Debility 02/03/2016  . Pneumonia of both lower lobes due to Pseudomonas species ([place])   . Pneumonia of both lower lobes due to methicillin susceptible Staphylococcus aureus (MSSA) ([place])   . Acute respiratory failure with hypoxia ([place])   . Acute encephalopathy   . Seizures ([place])   . Sepsis ([place])   . Hypoxia   . Pain   . Chronic respiratory failure ([place]) 12/28/2015  . Acute tracheobronchitis 12/24/2015  . Tracheostomy tube present ([place])   . Tracheal stenosis   . Chronic respiratory failure with hypoxia ([place])   . Spastic tetraplegia ([place]) 10/20/2015  . Tracheostomy status ([place]) 09/26/2015  . Allergic rhinitis 09/26/2015  . 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 Movement disorder 09/22/2015  . Encephalitis 09/22/2015  . Chest pain 02/07/2014  . Premature atrial contractions 01/13/2014  . Abnormality of gait 12/04/2013  . Hyperlipidemia 12/21/2011  . Cardiovascular risk factor 12/21/2011  . Bunion 01/31/2008  . Metatarsalgia of both feet 09/20/2007  . FLAT FOOT 09/20/2007  . HALLUX RIGIDUS, ACQUIRED 09/20/2007    Quay Burow, OTR/L 10/25/2017, 4:50 PM  [facility] [address] [place], [place], [address] Phone: [phone]   Fax:  [phone]  Name: Judy Spears MRN: [id number] Date of Birth: 1950-08-14

## 2017-10-27 ENCOUNTER — Ambulatory Visit: Payer: Medicare Other | Admitting: Rehabilitation

## 2017-10-27 ENCOUNTER — Encounter: Payer: Self-pay | Admitting: Rehabilitation

## 2017-10-27 ENCOUNTER — Encounter: Payer: Self-pay | Admitting: Occupational Therapy

## 2017-10-27 ENCOUNTER — Ambulatory Visit: Payer: Medicare Other | Admitting: Occupational Therapy

## 2017-10-27 DIAGNOSIS — R29818 Other symptoms and signs involving the nervous system: Secondary | ICD-10-CM

## 2017-10-27 DIAGNOSIS — R2681 Unsteadiness on feet: Secondary | ICD-10-CM

## 2017-10-27 DIAGNOSIS — M6281 Muscle weakness (generalized): Secondary | ICD-10-CM

## 2017-10-27 DIAGNOSIS — R293 Abnormal posture: Secondary | ICD-10-CM | POA: Diagnosis not present

## 2017-10-27 DIAGNOSIS — M25611 Stiffness of right shoulder, not elsewhere classified: Secondary | ICD-10-CM

## 2017-10-27 DIAGNOSIS — M25612 Stiffness of left shoulder, not elsewhere classified: Secondary | ICD-10-CM

## 2017-10-27 DIAGNOSIS — M25622 Stiffness of left elbow, not elsewhere classified: Secondary | ICD-10-CM

## 2017-10-27 DIAGNOSIS — M25651 Stiffness of right hip, not elsewhere classified: Secondary | ICD-10-CM

## 2017-10-27 DIAGNOSIS — R41844 Frontal lobe and executive function deficit: Secondary | ICD-10-CM

## 2017-10-27 DIAGNOSIS — R2689 Other abnormalities of gait and mobility: Secondary | ICD-10-CM

## 2017-10-27 DIAGNOSIS — M25652 Stiffness of left hip, not elsewhere classified: Secondary | ICD-10-CM

## 2017-10-27 NOTE — Therapy (Signed)
[facility] [address] [place], [place], [address] Phone: [phone]   Fax:  [phone]  Occupational Therapy Treatment  Patient Details  Name: Judy Spears MRN: [id number] Date of Birth: [birth year] Referring Provider (OT): Dr. [name]   Encounter Date: 10/27/2017  OT End of Session - 10/27/17 1656    Visit Number  29    Number of Visits  44    Date for OT Re-Evaluation  12/20/17    Authorization Type  medicare maintenance program - pt to be reassessed every 30 days per Medicare guidelines  Will need PN every 10th visit    Authorization Time Period  90 days - 12/20/2017    Authorization - Visit Number  34    Authorization - Number of Visits  30    OT Start Time  1448    OT Stop Time  1530    OT Time Calculation (min)  42 min    Activity Tolerance  Patient tolerated treatment well       Past Medical History:  Diagnosis Date  . Arthritis    lt great toe  . Complication of anesthesia    pt has had headaches post op-did advise to hydra well preop  . Hair loss    right-sided  . History of exercise stress test    a. ETT (12/15) with 12:00 exercise, no ST changes, occasional PACs.   . Hyperlipidemia   . Insomnia   . Migraine headache   . Movement disorder    resltess in left legs  . Postmenopausal   . 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 Premature atrial contractions    a. Holter (12/15) with 8% PACs, no atrial runs or atrial fibrillation.     Past Surgical History:  Procedure Laterality Date  . BREAST BIOPSY  01/01/2011   Procedure: BREAST BIOPSY WITH NEEDLE LOCALIZATION;  Surgeon: Rolm Bookbinder, MD;  Location: [facility];  Service: General;  Laterality: Left;  left breast wire localization biopsy  . BREAST BIOPSY Right 07/27/2012  . BREAST BIOPSY Right 07/07/2016  . BREAST BIOPSY Left 06/30/2016  . cataracts right eye    . CESAREAN SECTION  1986  . HERNIA REPAIR  2000   RIH  .  INTRAMEDULLARY (IM) NAIL INTERTROCHANTERIC Left 01/30/2017   Procedure: ORIF AFFIXUS NAIL;  Surgeon: Paralee Cancel, MD;  Location: [facility];  Service: Orthopedics;  Laterality: Left;  . IR REPLC GASTRO/COLONIC TUBE PERCUT W/FLUORO  09/29/2016  . opirectinal membrane peel    . PEG PLACEMENT    . RHINOPLASTY  1983  . TRACHEOSTOMY    . TRACHEOSTOMY TUBE PLACEMENT N/A 12/15/2016   Procedure: TRACHEOSTOMY CHANGE;  Surgeon: [name] Montane, MD;  Location: [facility];  Service: ENT;  Laterality: N/A;    There were no vitals filed for this visit.  Subjective Assessment - 10/27/17 1649    Subjective   Yes and nodding head yes when asked if she liked using body weight supported gait system.    Patient is accompained by:  Family member   caregiver [name]   Pertinent History  see epic snapshot - ABI/encephalitis/hypoxia from virus; recent hospitalization due to MRSA PNA     Patient Stated Goals  Pt unable to state.     Currently in Pain?  No/denies                   OT Treatments/Exercises (OP) - 10/27/17 0001      Neurological Re-education Exercises  Other Exercises 1  Neuro re ed in standing and with stepping using body weight supported Biodex. Treatment focused on postural alignment and control in standing and stepping as well as active standing, lateral weight shifting, head righting as able and active weight bearing up in standing.  Pt required max a x 2 for approximately 60 feet with 1 rest break.  Upon sitting pt with improved phonation as well as decreased spasticity and improved alignment in sitting in wheelchair.                   OT Long Term Goals - 10/27/17 1654      OT LONG TERM GOAL #1   Title  Pt will maintain ability to roll in bed during self care tasks with no more than max a x1 - reasssess 10/25/2017    Baseline  10/25/2017 pt able to maintain rolling with max a x1     Status  On-going      OT LONG TERM GOAL #2   Title  Pt will maintain the ability to use roll in  shower chair with care giver assistance     Baseline  10/25/2017  pt continues to demonsrate adequate trunk control, sititng balance and decreased tone to use shower chair with care giver assistance.    Status  On-going      OT LONG TERM GOAL #3   Title  Pt will maintain ability to transfer to toilet with no more than max a x1 using Steady Assist - 10/06/2017    Baseline  10/25/2017 pt transfers with steady assist to toilet with max a x1    Status  On-going      OT LONG TERM GOAL #4   Title  Pt wil maintain ability for dressing at max a x1.     Baseline  10/25/2017  pt able to be assisted with dressing by 1 person only at max a level    Status  On-going      OT LONG TERM GOAL #5   Title  Pt will maintain ability to stand with max a x1 to for clothing mgmt with toileting.     Baseline  10/25/2017 pt able to stand with max a x 1 during toileting tasks    Status  On-going      OT LONG TERM GOAL #6   Title  Pt will maintain ability to participate in functional transfers, as well as stand to sit and sit to stand with no more than max a x1    Status  On-going   10/25/2017 pt able to transfer and complete sit to stand and stand to sit with max a x1     OT LONG TERM GOAL #7   Title  Pt will maintain functional mobility and trunk control to aid in her ability to clear secretions and assist with phonation to express basic needs/wants    Status  On-going   10/25/2017  pt has been able to maintain secretions, has had no upper respiratory issues and is able to phonate basic needs/wants           Plan - 10/27/17 1654    Clinical Impression Statement  Pt very alert today and actively participating in therapy activity.  Pt following simple one step commands more consistently today.     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    Occupational performance deficits (Please refer to evaluation for details):  ADL's;IADL's;Rest and Sleep;Leisure;Social Participation;Work    Physicist, medical    Current Impairments/barriers affecting progress:  severity of multi system deficits, intermittent medical issues    OT Frequency  2x / week    OT Duration  12 weeks    OT Treatment/Interventions  Self-care/ADL training;Ultrasound;Moist Heat;Electrical Stimulation;DME and/or AE instruction;Neuromuscular education;Therapeutic exercise;Functional Mobility Training;Manual Therapy;Passive range of motion;Splinting;Therapeutic activities;Balance training;Patient/family education;Visual/perceptual remediation/compensation;Cognitive remediation/compensation    Plan  address rigidity and tone in trunk and extremeties, NMR trunk and LE's for transitional movements, static standing, sitting balance, sit to stand, stand to sit, stepping;    Consulted and Agree with Plan of Care  Patient;Family member/caregiver    Family Member Consulted  caregiver [name]       Patient will benefit from skilled therapeutic intervention in order to improve the following deficits and impairments:  Decreased endurance, Decreased skin integrity, Impaired vision/preception, Improper body mechanics, Impaired perceived functional ability, Decreased knowledge of precautions, Cardiopulmonary status limiting activity, Decreased activity tolerance, Decreased knowledge of use of DME, Decreased strength, Impaired flexibility, Improper spinal/pelvic alignment, Impaired sensation, Difficulty walking, Decreased mobility, Decreased balance, Decreased cognition, Decreased range of motion, Impaired tone, Impaired UE functional use, Decreased safety awareness, Decreased coordination, Pain, Abnormal gait  Visit Diagnosis: Abnormal posture  Muscle weakness (generalized)  Other symptoms and signs involving the nervous  system  Unsteadiness on feet  Frontal lobe and executive function deficit  Stiffness of right shoulder, not elsewhere classified  Stiffness of left shoulder, not elsewhere classified  Stiffness of left elbow, not elsewhere classified    Problem List Patient Active Problem List   Diagnosis Date Noted  . Coagulase negative Staphylococcus bacteremia 02/09/2017  . Abdominal distension   . Chronic mucus hypersecretion, respiratory 12/31/2016  . Severe protein-calorie malnutrition [name]: less than 60% of standard weight) ([place]) 12/31/2016  . Acute urinary retention 12/31/2016  . Constipation 12/31/2016  . Shortness of breath   . Respiratory distress   . Pneumonia 12/05/2016  . Acute respiratory failure ([place]) 12/05/2016  . Pseudomonas urinary tract infection 10/27/2016  . Severe muscle deconditioning 09/30/2016  . Pressure injury of skin 09/28/2016  . Respiratory failure ([place])   . Tracheobronchitis   . Urinary retention   . PEG (percutaneous endoscopic gastrostomy) status ([place])   . Benign essential HTN   . Hyponatremia   . Hypokalemia   . Reactive hypertension   . Respiratory insufficiency 09/24/2016  . Malignant neoplasm of upper-inner quadrant of left breast in female, estrogen receptor positive ([place]) 07/14/2016  . Heterotopic ossification of bone 03/10/2016  . Acute on chronic respiratory failure with hypoxia ([place])   . Acute hypoxemic respiratory failure ([place])   . Acute blood loss anemia   . Atelectasis 02/04/2016  . Debility 02/03/2016  . Pneumonia of both lower lobes due to Pseudomonas species ([place])   . Pneumonia of both lower lobes due to methicillin susceptible Staphylococcus aureus (MSSA) ([place])   . Acute respiratory failure with hypoxia ([place])   . Acute encephalopathy   . Seizures ([place])   . Sepsis ([place])   . Hypoxia   . Pain   . Chronic respiratory failure ([place]) 12/28/2015  . Acute tracheobronchitis 12/24/2015  . Tracheostomy tube present ([place])   . Tracheal  stenosis   . Chronic respiratory failure with hypoxia ([place])   . Spastic tetraplegia ([place]) 10/20/2015  . Tracheostomy status ([place]) 09/26/2015  .  Allergic rhinitis 09/26/2015  . 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 Movement disorder 09/22/2015  . Encephalitis 09/22/2015  . Chest pain 02/07/2014  . Premature atrial contractions 01/13/2014  . Abnormality of gait 12/04/2013  . Hyperlipidemia 12/21/2011  . Cardiovascular risk factor 12/21/2011  . Bunion 01/31/2008  . Metatarsalgia of both feet 09/20/2007  . FLAT FOOT 09/20/2007  . HALLUX RIGIDUS, ACQUIRED 09/20/2007    Quay Burow, OTR/L 10/27/2017, 4:57 PM  [facility] [address] [place], [place], [address] Phone: [phone]   Fax:  [phone]  Name: Larah Kuntzman MRN: [id number] Date of Birth: 08/22/1950

## 2017-10-27 NOTE — Therapy (Signed)
[facility] [address] [place], [place], [address] Phone: [phone]   Fax:  [phone]  Physical Therapy Treatment  Patient Details  Name: Judy Spears MRN: [id number] Date of Birth: [birth year] Referring Provider (PT): Dr. Levada Schilling   Encounter Date: 10/27/2017  PT End of Session - 10/27/17 1627    Visit Number  29    Number of Visits  38    Date for PT Re-Evaluation  12/30/17    Authorization Type  [employer] Medicare    Authorization Time Period  10-04-17 - 12-30-17    PT Start Time  1405    PT Stop Time  1445    PT Time Calculation (min)  40 min    Activity Tolerance  Patient tolerated treatment well    Behavior During Therapy  [facility] for tasks assessed/performed       Past Medical History:  Diagnosis Date  . Arthritis    lt great toe  . Complication of anesthesia    pt has had headaches post op-did advise to hydra well preop  . Hair loss    right-sided  . History of exercise stress test    a. ETT (12/15) with 12:00 exercise, no ST changes, occasional PACs.   . Hyperlipidemia   . Insomnia   . Migraine headache   . Movement disorder    resltess in left legs  . Postmenopausal   . 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 Premature atrial contractions    a. Holter (12/15) with 8% PACs, no atrial runs or atrial fibrillation.     Past Surgical History:  Procedure Laterality Date  . BREAST BIOPSY  01/01/2011   Procedure: BREAST BIOPSY WITH NEEDLE LOCALIZATION;  Surgeon: Rolm Bookbinder, MD;  Location: [facility];  Service: General;  Laterality: Left;  left breast wire localization biopsy  . BREAST BIOPSY Right 07/27/2012  . BREAST BIOPSY Right 07/07/2016  . BREAST BIOPSY Left 06/30/2016  . cataracts right eye    . CESAREAN SECTION  1986  . HERNIA REPAIR  2000   RIH  . INTRAMEDULLARY (IM) NAIL INTERTROCHANTERIC Left 01/30/2017   Procedure: ORIF AFFIXUS NAIL;  Surgeon: Paralee Cancel, MD;   Location: [facility];  Service: Orthopedics;  Laterality: Left;  . IR REPLC GASTRO/COLONIC TUBE PERCUT W/FLUORO  09/29/2016  . opirectinal membrane peel    . PEG PLACEMENT    . RHINOPLASTY  1983  . TRACHEOSTOMY    . TRACHEOSTOMY TUBE PLACEMENT N/A 12/15/2016   Procedure: TRACHEOSTOMY CHANGE;  Surgeon: [name] Montane, MD;  Location: [facility];  Service: ENT;  Laterality: N/A;    There were no vitals filed for this visit.  Subjective Assessment - 10/27/17 1604    Subjective  Pt accompanied to PT by Caregiver [name].  Reports no changes since last session, doing well today.     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  No/denies                       [facility] Adult PT Treatment/Exercise - 10/27/17 1410      Transfers   Stand Pivot Transfers  3: Mod assist    Stand Pivot Transfer Details (indicate cue type and reason)  w/c>mat with mod A with facilitation and cues for forward trunk flexion and forward weight  shift.  Pt does well moving feet today once in standing.       Therapeutic Activites    Therapeutic Activities  --      Neuro Re-ed    Neuro Re-ed Details   With pt in reclined position on wedge, assisted into modified long sit with max A so that body weight support harness could be placed around hips.  Straps were able to be tightened in this reclined position where she was then assisted to corner of high low mat and into standing.  Then attached to body weight support system.  PT/second PT assisted with maintaining upright trunk and head alignment, while second PT assisted with hips/pelvis and lateral/forward weight shifts in stance while tech assisted with guiding body weight support system.  Only able to ambulate x 15' before OT session.  PTs worked to have pt initiate movement as much as possible, however she still has difficulty with L lateral weight shift  and L trunk elongation.  Also note that PT allowed pt to have BUE support (through L elbow and R hand) beside of body to allow more upright posture.  Pt tolerated very well.  See OT notes for remainder of body weight support gait and standing exercises.       Exercises   Exercises  Other Exercises    Other Exercises   Supine with back reclined on wedge with 3 pillows with emphasis on lower body on trunk and trunk on lower body rotation to decrease tone and improve flexibility prior to standing and gait.  Performed lower body rotation x 5 reps each side with 20 sec hold x 1 rep on each side.  Upper trunk rotation on lower body x 2 reps each direction x 20 secs holds with pt counting aloud for improved breath control.  knee to chest x 10 reps each side with 30 sec hold on last one with cues and facilitation for decreased tone esp on RLE.  Bridging x 10 reps with assist for LE placement.                 PT Short Term Goals - 06/10/17 1305      PT SHORT TERM GOAL #1   Title  Perform sit to/from stand from wheelchair or mat with +1 mod assist.    Baseline  06/09/16: met today    Status  Achieved      PT SHORT TERM GOAL #2   Title  Pt will perform sit to stand from wheelchair to RW with max assist for pre-gait skill.    Baseline  06/09/17: met today    Status  Achieved      PT SHORT TERM GOAL #3   Title  Pt will transfer supine to sit with max assist.    Baseline  06/09/17: met today, needs increased time to assist    Status  Achieved      PT SHORT TERM GOAL #4   Title  Pt will sit with RUE support with min to mod assist unsupported on side of mat for at least 3" to demo improved trunk strength and core stabilization.    Baseline  06/09/17: met today     Status  Achieved      PT SHORT TERM GOAL #5   Title  Pt will amb. 15' with appropriate assistive device with max assist +1    Baseline  06/09/17: pt now needs mod/max assist of 2 people for gait using modified 3 muskateer assist and  blue  rocker AFO to right LE    Status  Partially Met        PT Long Term Goals - 10/05/17 1042      PT LONG TERM GOAL #3   Title  Pt will amb. 36' with appropriate assistive device with +1 max assist.    Baseline  amb. 40' with Stedy with +2 mod assist with bil. AFO's on 10-04-17    Time  12    Period  Weeks    Status  New    Target Date  12/30/17      PT LONG TERM GOAL #4   Title  Pt will maintain sitting balance with RUE support for at least 5" with SBA for increased independence with ADL's.    Baseline  min assist needed on 10-04-17 for period of 5" seated on side of mat     Time  12    Period  Weeks    Status  New    Target Date  12/30/17      PT LONG TERM GOAL #5   Title  Perform static standing x 5 with bil. UE support with SBA for assistance with ADL's and for LE weight-bearing and stretching.    Baseline  able to stand 5" with Charlaine Dalton turned backwards     Time  12    Period  Weeks    Status  New    Target Date  12/30/17      PT LONG TERM GOAL #6   Title  Pt will be able to return to participation in therapeutic horseback riding at [facility] for ongoing exercise program. (NEW MAINTAINANCE GOALS:  (Reassess 11-18-17 & 12-30-17)     Baseline  Not returned as of 09/29/17 - pt has had bil. hip surgeries    Time  12    Period  Weeks    Status  On-going    Target Date  12/30/17      PT LONG TERM GOAL #7   Title  Pt will maintain ability to transfer (stand pivot) from varying seated surfaces at max A level to decrease burden of care to single caregiver.   12-30-17 TARGET DATE    Baseline  mod to max A (variable) on 09/29/17    Time  12    Period  Weeks    Status  On-going      PT LONG TERM GOAL #8   Title  Pt will maintain sitting balance with RUE support at min A level x 5 minutes in order to maintain postural and trunk control without external support of w/c in order to indicate improved postural alignment.      Baseline  Pt able to sit at S to min A level with RUE support x 5  mins, requires increased support if RUE not able to support on mat 09/29/17; met 10-04-17    Status  Achieved      PT LONG TERM GOAL  #9   TITLE  Pt will maintain ability to ambulate with +2 max A for 20' in order to faciliate weight bearing to maintain bone density, decrease spasticity, and decrease the progression of contractures.     Baseline  Pt able to ambulate x 25' with +2 max A 09/29/17; +2 mod to max assist with Stedy for 23' on 10-04-17    Status  Achieved      PT LONG TERM GOAL  #10   TITLE  Pt will maintain ability to stand with RUE  support x 5 mins at min A level for LE stretching, for improved bowel, bladder and respiratory function.      Baseline  met with standing with Stedy turned around backwards on 10-04-17    Status  Achieved            Plan - 10/27/17 1628    Clinical Impression Statement  Pt able to tolerate standing and gait in body weight support system today.  This sessio focused mainly on reclined therex to reduce tone and improve flexibilty prior to standing and gait, however PT was able to get pt hooked into harness and into system and ambulate very short distance.  Pt tolerated very well and was better able to initaite upright postures and lateral weight shifts.      Rehab Potential  Fair    Clinical Impairments Affecting Rehab Potential  severity of deficits with previous extensive therapies    PT Frequency  1x / week    PT Duration  12 weeks    PT Treatment/Interventions  ADLs/Self Care Home Management;Therapeutic exercise;Therapeutic activities;Functional mobility training;Gait training;Balance training;Neuromuscular re-education;Patient/family education;Orthotic Fit/Training;Passive range of motion    PT Next Visit Plan  staff blocked to assist/trial gait with use of unweighting system next session - try toe off AFO's?    PT Home Exercise Plan  caregiver and husband have previously been instructed in ROM, sitting balance, and transfer training    Consulted and  Agree with Plan of Care  Patient;Other (Comment)       Patient will benefit from skilled therapeutic intervention in order to improve the following deficits and impairments:  Abnormal gait, Decreased activity tolerance, Decreased balance, Decreased cognition, Decreased coordination, Decreased mobility, Decreased range of motion, Decreased endurance, Hypomobility, Decreased strength, Impaired flexibility, Increased muscle spasms, Impaired UE functional use, Postural dysfunction, Pain  Visit Diagnosis: Abnormal posture  Muscle weakness (generalized)  Other symptoms and signs involving the nervous system  Unsteadiness on feet  Stiffness of left hip, not elsewhere classified  Stiffness of right hip, not elsewhere classified  Other abnormalities of gait and mobility     Problem List Patient Active Problem List   Diagnosis Date Noted  . Coagulase negative Staphylococcus bacteremia 02/09/2017  . Abdominal distension   . Chronic mucus hypersecretion, respiratory 12/31/2016  . Severe protein-calorie malnutrition [name]: less than 60% of standard weight) ([place]) 12/31/2016  . Acute urinary retention 12/31/2016  . Constipation 12/31/2016  . Shortness of breath   . Respiratory distress   . Pneumonia 12/05/2016  . Acute respiratory failure ([place]) 12/05/2016  . Pseudomonas urinary tract infection 10/27/2016  . Severe muscle deconditioning 09/30/2016  . Pressure injury of skin 09/28/2016  . Respiratory failure ([place])   . Tracheobronchitis   . Urinary retention   . PEG (percutaneous endoscopic gastrostomy) status ([place])   . Benign essential HTN   . Hyponatremia   . Hypokalemia   . Reactive hypertension   . Respiratory insufficiency 09/24/2016  . Malignant neoplasm of upper-inner quadrant of left breast in female, estrogen receptor positive ([place]) 07/14/2016  . Heterotopic ossification of bone 03/10/2016  . Acute on chronic respiratory failure with hypoxia ([place])   . Acute hypoxemic  respiratory failure ([place])   . Acute blood loss anemia   . Atelectasis 02/04/2016  . Debility 02/03/2016  . Pneumonia of both lower lobes due to Pseudomonas species ([place])   . Pneumonia of both lower lobes due to methicillin susceptible Staphylococcus aureus (MSSA) ([place])   . Acute respiratory failure with hypoxia (  [place])   . Acute encephalopathy   . Seizures ([place])   . Sepsis ([place])   . Hypoxia   . Pain   . Chronic respiratory failure ([place]) 12/28/2015  . Acute tracheobronchitis 12/24/2015  . Tracheostomy tube present ([place])   . Tracheal stenosis   . Chronic respiratory failure with hypoxia ([place])   . Spastic tetraplegia ([place]) 10/20/2015  . Tracheostomy status ([place]) 09/26/2015  . Allergic rhinitis 09/26/2015  . 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 Movement disorder 09/22/2015  . Encephalitis 09/22/2015  . Chest pain 02/07/2014  . Premature atrial contractions 01/13/2014  . Abnormality of gait 12/04/2013  . Hyperlipidemia 12/21/2011  . Cardiovascular risk factor 12/21/2011  . Bunion 01/31/2008  . Metatarsalgia of both feet 09/20/2007  . FLAT FOOT 09/20/2007  . HALLUX RIGIDUS, ACQUIRED 09/20/2007   Cameron Sprang, PT, MPT [facility] [address] [place], [place], [address] Phone: [phone]   Fax:  [phone] 10/27/17, 4:30 PM  Name: Laynie Espy MRN: [id number] Date of Birth: [birth year]

## 2017-11-01 ENCOUNTER — Encounter: Payer: Self-pay | Admitting: Occupational Therapy

## 2017-11-01 ENCOUNTER — Ambulatory Visit: Payer: Medicare Other | Admitting: Occupational Therapy

## 2017-11-01 DIAGNOSIS — R29818 Other symptoms and signs involving the nervous system: Secondary | ICD-10-CM

## 2017-11-01 DIAGNOSIS — M25622 Stiffness of left elbow, not elsewhere classified: Secondary | ICD-10-CM

## 2017-11-01 DIAGNOSIS — M6281 Muscle weakness (generalized): Secondary | ICD-10-CM

## 2017-11-01 DIAGNOSIS — R2681 Unsteadiness on feet: Secondary | ICD-10-CM

## 2017-11-01 DIAGNOSIS — R293 Abnormal posture: Secondary | ICD-10-CM | POA: Diagnosis not present

## 2017-11-01 DIAGNOSIS — R41844 Frontal lobe and executive function deficit: Secondary | ICD-10-CM

## 2017-11-01 DIAGNOSIS — M25611 Stiffness of right shoulder, not elsewhere classified: Secondary | ICD-10-CM

## 2017-11-01 DIAGNOSIS — M25612 Stiffness of left shoulder, not elsewhere classified: Secondary | ICD-10-CM

## 2017-11-01 NOTE — Therapy (Signed)
[facility] [address] [place], [place], [address] Phone: [phone]   Fax:  [phone]  Occupational Therapy Treatment  Patient Details  Name: Judy Spears MRN: [id number] Date of Birth: April 18, 1950 Referring Provider (OT): Dr. [name]   Encounter Date: 11/01/2017  OT End of Session - 11/01/17 1636    Visit Number  30    Number of Visits  44    Date for OT Re-Evaluation  12/20/17    Authorization Type  medicare maintenance program - pt to be reassessed every 30 days per Medicare guidelines  Will need PN every 10th visit    Authorization Time Period  90 days - 12/20/2017    Authorization - Visit Number  66    Authorization - Number of Visits  19    OT Start Time  1403   husband waited in line and checked pt in   OT Stop Time  1445    OT Time Calculation (min)  42 min    Activity Tolerance  Patient tolerated treatment well       Past Medical History:  Diagnosis Date  . Arthritis    lt great toe  . Complication of anesthesia    pt has had headaches post op-did advise to hydra well preop  . Hair loss    right-sided  . History of exercise stress test    a. ETT (12/15) with 12:00 exercise, no ST changes, occasional PACs.   . Hyperlipidemia   . Insomnia   . Migraine headache   . Movement disorder    resltess in left legs  . Postmenopausal   . 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 Premature atrial contractions    a. Holter (12/15) with 8% PACs, no atrial runs or atrial fibrillation.     Past Surgical History:  Procedure Laterality Date  . BREAST BIOPSY  01/01/2011   Procedure: BREAST BIOPSY WITH NEEDLE LOCALIZATION;  Surgeon: Rolm Bookbinder, MD;  Location: [facility];  Service: General;  Laterality: Left;  left breast wire localization biopsy  . BREAST BIOPSY Right 07/27/2012  . BREAST BIOPSY Right 07/07/2016  . BREAST BIOPSY Left 06/30/2016  . cataracts right eye    . CESAREAN  SECTION  1986  . HERNIA REPAIR  2000   RIH  . INTRAMEDULLARY (IM) NAIL INTERTROCHANTERIC Left 01/30/2017   Procedure: ORIF AFFIXUS NAIL;  Surgeon: Paralee Cancel, MD;  Location: [facility];  Service: Orthopedics;  Laterality: Left;  . IR REPLC GASTRO/COLONIC TUBE PERCUT W/FLUORO  09/29/2016  . opirectinal membrane peel    . PEG PLACEMENT    . RHINOPLASTY  1983  . TRACHEOSTOMY    . TRACHEOSTOMY TUBE PLACEMENT N/A 12/15/2016   Procedure: TRACHEOSTOMY CHANGE;  Surgeon: [name] Montane, MD;  Location: [facility];  Service: ENT;  Laterality: N/A;    There were no vitals filed for this visit.  Subjective Assessment - 11/01/17 1628    Subjective   It bothers my finger (when making splint)    Patient is accompained by:  Family member   husband [name], caregiver [name]   Pertinent History  see epic snapshot - ABI/encephalitis/hypoxia from virus; recent hospitalization due to MRSA PNA     Patient Stated Goals  Pt unable to state.     Currently in Pain?  No/denies                   OT Treatments/Exercises (OP) - 11/01/17 0001      Splinting  Splinting  Initiated fabrication of modified wrist cock up for R hand to position L hand in better alignment for functional use.  Will complete splint next session and educated caregiver on wear and care.  Pt, husband and caregiver all understand that this splint will be used only for intermittent time during the day when pt is attempting to use her Lhand functionally. Will also fabricate resting hand splint for both hands for night time use (pt had splints however one was lost during a hospitalization, one broke and the modified wrist cock up she has does not fit well at this time.(caregive states they stopped using it when pt experienced last major medical set back.                   OT Long Term Goals - 11/01/17 1633      OT LONG TERM GOAL #1   Title  Pt will maintain ability to roll in bed during self care tasks with no more than max a x1 -  reasssess 11/24/2017    Baseline  10/25/2017 pt able to maintain rolling with max a x1     Status  On-going      OT LONG TERM GOAL #2   Title  Pt will maintain the ability to use roll in shower chair with care giver assistance     Baseline  10/25/2017  pt continues to demonsrate adequate trunk control, sititng balance and decreased tone to use shower chair with care giver assistance.    Status  On-going      OT LONG TERM GOAL #3   Title  Pt will maintain ability to transfer to toilet with no more than max a x1 using Steady Assist - 10/06/2017    Baseline  10/25/2017 pt transfers with steady assist to toilet with max a x1    Status  On-going      OT LONG TERM GOAL #4   Title  Pt wil maintain ability for dressing at max a x1.     Baseline  10/25/2017  pt able to be assisted with dressing by 1 person only at max a level    Status  On-going      OT LONG TERM GOAL #5   Title  Pt will maintain ability to stand with max a x1 to for clothing mgmt with toileting.     Baseline  10/25/2017 pt able to stand with max a x 1 during toileting tasks    Status  On-going      OT LONG TERM GOAL #6   Title  Pt will maintain ability to participate in functional transfers, as well as stand to sit and sit to stand with no more than max a x1    Status  On-going   10/25/2017 pt able to transfer and complete sit to stand and stand to sit with max a x1     OT LONG TERM GOAL #7   Title  Pt will maintain functional mobility and trunk control to aid in her ability to clear secretions and assist with phonation to express basic needs/wants    Status  On-going   10/25/2017  pt has been able to maintain secretions, has had no upper respiratory issues and is able to phonate basic needs/wants           Plan - 11/01/17 1634    Clinical Impression Statement  Pt with decreased arousal today. Pt was able to provide feedback on how splint felt on L  hand.     Occupational Profile and client history currently impacting  functional performance  12/12 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    Occupational performance deficits (Please refer to evaluation for details):  ADL's;IADL's;Rest and Sleep;Leisure;Social Participation;Work    Physicist, medical    Current Impairments/barriers affecting progress:  severity of multi system deficits, intermittent medical issues    OT Frequency  2x / week    OT Duration  12 weeks    OT Treatment/Interventions  Self-care/ADL training;Ultrasound;Moist Heat;Electrical Stimulation;DME and/or AE instruction;Neuromuscular education;Therapeutic exercise;Functional Mobility Training;Manual Therapy;Passive range of motion;Splinting;Therapeutic activities;Balance training;Patient/family education;Visual/perceptual remediation/compensation;Cognitive remediation/compensation    Plan  complete fabrication of L wrist cock up splint, fabricate resting hand splint for R and L hands for night time wear. address rigidity and tone in trunk and extremeties, NMR trunk and LE's for transitional movements, static standing, sitting balance, sit to stand, stand to sit, stepping;    Consulted and Agree with Plan of Care  Patient;Family member/caregiver    Family Member Consulted  caregiver [name]       Patient will benefit from skilled therapeutic intervention in order to improve the following deficits and impairments:  Decreased endurance, Decreased skin integrity, Impaired vision/preception, Improper body mechanics, Impaired perceived functional ability, Decreased knowledge of precautions, Cardiopulmonary status limiting activity, Decreased activity tolerance, Decreased knowledge of use of DME, Decreased strength, Impaired flexibility, Improper spinal/pelvic alignment, Impaired sensation, Difficulty walking, Decreased mobility, Decreased balance, Decreased cognition, Decreased  range of motion, Impaired tone, Impaired UE functional use, Decreased safety awareness, Decreased coordination, Pain, Abnormal gait  Visit Diagnosis: Abnormal posture  Muscle weakness (generalized)  Other symptoms and signs involving the nervous system  Unsteadiness on feet  Frontal lobe and executive function deficit  Stiffness of right shoulder, not elsewhere classified  Stiffness of left shoulder, not elsewhere classified  Stiffness of left elbow, not elsewhere classified    Problem List Patient Active Problem List   Diagnosis Date Noted  . Coagulase negative Staphylococcus bacteremia 02/09/2017  . Abdominal distension   . Chronic mucus hypersecretion, respiratory 12/31/2016  . Severe protein-calorie malnutrition [name]: less than 60% of standard weight) ([place]) 12/31/2016  . Acute urinary retention 12/31/2016  . Constipation 12/31/2016  . Shortness of breath   . Respiratory distress   . Pneumonia 12/05/2016  . Acute respiratory failure ([place]) 12/05/2016  . Pseudomonas urinary tract infection 10/27/2016  . Severe muscle deconditioning 09/30/2016  . Pressure injury of skin 09/28/2016  . Respiratory failure ([place])   . Tracheobronchitis   . Urinary retention   . PEG (percutaneous endoscopic gastrostomy) status ([place])   . Benign essential HTN   . Hyponatremia   . Hypokalemia   . Reactive hypertension   . Respiratory insufficiency 09/24/2016  . Malignant neoplasm of upper-inner quadrant of left breast in female, estrogen receptor positive ([place]) 07/14/2016  . Heterotopic ossification of bone 03/10/2016  . Acute on chronic respiratory failure with hypoxia ([place])   . Acute hypoxemic respiratory failure ([place])   . Acute blood loss anemia   . Atelectasis 02/04/2016  . Debility 02/03/2016  . Pneumonia of both lower lobes due to Pseudomonas species ([place])   . Pneumonia of both lower lobes due to methicillin susceptible Staphylococcus aureus (MSSA) ([place])   . Acute respiratory  failure with hypoxia ([place])   . Acute encephalopathy   . Seizures ([place])   . Sepsis ([place])   .  Hypoxia   . Pain   . Chronic respiratory failure ([place]) 12/28/2015  . Acute tracheobronchitis 12/24/2015  . Tracheostomy tube present ([place])   . Tracheal stenosis   . Chronic respiratory failure with hypoxia ([place])   . Spastic tetraplegia ([place]) 10/20/2015  . Tracheostomy status ([place]) 09/26/2015  . Allergic rhinitis 09/26/2015  . 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 Movement disorder 09/22/2015  . Encephalitis 09/22/2015  . Chest pain 02/07/2014  . Premature atrial contractions 01/13/2014  . Abnormality of gait 12/04/2013  . Hyperlipidemia 12/21/2011  . Cardiovascular risk factor 12/21/2011  . Bunion 01/31/2008  . Metatarsalgia of both feet 09/20/2007  . FLAT FOOT 09/20/2007  . HALLUX RIGIDUS, ACQUIRED 09/20/2007  Occupational Therapy Progress Note  Dates of Reporting Period: 09/27/2017 to 11/01/2017  Objective Reports of Subjective Statement: see above  Objective Measurements: see above  Goal Update: see above  Plan: see above  Reason Skilled Services are Required: see above  Quay Burow, OTR/L 11/01/2017, 4:38 PM  [facility] [address] [place], [place], [address] Phone: [phone]   Fax:  [phone]  Name: Imanii Gosdin MRN: [id number] Date of Birth: 12-Oct-1950

## 2017-11-03 ENCOUNTER — Encounter: Payer: Self-pay | Admitting: Physical Therapy

## 2017-11-03 ENCOUNTER — Ambulatory Visit: Payer: Medicare Other | Admitting: Occupational Therapy

## 2017-11-03 ENCOUNTER — Encounter: Payer: Self-pay | Admitting: Occupational Therapy

## 2017-11-03 ENCOUNTER — Ambulatory Visit: Payer: Medicare Other | Admitting: Physical Therapy

## 2017-11-03 DIAGNOSIS — R2681 Unsteadiness on feet: Secondary | ICD-10-CM

## 2017-11-03 DIAGNOSIS — M6281 Muscle weakness (generalized): Secondary | ICD-10-CM

## 2017-11-03 DIAGNOSIS — R29818 Other symptoms and signs involving the nervous system: Secondary | ICD-10-CM

## 2017-11-03 DIAGNOSIS — R293 Abnormal posture: Secondary | ICD-10-CM

## 2017-11-03 DIAGNOSIS — R41844 Frontal lobe and executive function deficit: Secondary | ICD-10-CM

## 2017-11-03 DIAGNOSIS — M25622 Stiffness of left elbow, not elsewhere classified: Secondary | ICD-10-CM

## 2017-11-03 DIAGNOSIS — M25611 Stiffness of right shoulder, not elsewhere classified: Secondary | ICD-10-CM

## 2017-11-03 DIAGNOSIS — M25612 Stiffness of left shoulder, not elsewhere classified: Secondary | ICD-10-CM

## 2017-11-03 NOTE — Therapy (Signed)
[facility] [address] [place], [place], [address] Phone: [phone]   Fax:  [phone]  Physical Therapy Treatment  Patient Details  Name: Judy Spears MRN: [id number] Date of Birth: December 13, 1950 Referring Provider (PT): Dr. Levada Schilling   Encounter Date: 11/03/2017  PT End of Session - 11/03/17 1944    Visit Number  30    Number of Visits  38    Date for PT Re-Evaluation  12/30/17    Authorization Type  [employer] Medicare    Authorization Time Period  10-04-17 - 12-30-17    PT Start Time  1408    PT Stop Time  1452    PT Time Calculation (min)  44 min       Past Medical History:  Diagnosis Date  . Arthritis    lt great toe  . Complication of anesthesia    pt has had headaches post op-did advise to hydra well preop  . Hair loss    right-sided  . History of exercise stress test    a. ETT (12/15) with 12:00 exercise, no ST changes, occasional PACs.   . Hyperlipidemia   . Insomnia   . Migraine headache   . Movement disorder    resltess in left legs  . Postmenopausal   . 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 Premature atrial contractions    a. Holter (12/15) with 8% PACs, no atrial runs or atrial fibrillation.     Past Surgical History:  Procedure Laterality Date  . BREAST BIOPSY  01/01/2011   Procedure: BREAST BIOPSY WITH NEEDLE LOCALIZATION;  Surgeon: Rolm Bookbinder, MD;  Location: [facility];  Service: General;  Laterality: Left;  left breast wire localization biopsy  . BREAST BIOPSY Right 07/27/2012  . BREAST BIOPSY Right 07/07/2016  . BREAST BIOPSY Left 06/30/2016  . cataracts right eye    . CESAREAN SECTION  1986  . HERNIA REPAIR  2000   RIH  . INTRAMEDULLARY (IM) NAIL INTERTROCHANTERIC Left 01/30/2017   Procedure: ORIF AFFIXUS NAIL;  Surgeon: Paralee Cancel, MD;  Location: [facility];  Service: Orthopedics;  Laterality: Left;  . IR REPLC GASTRO/COLONIC TUBE PERCUT W/FLUORO  09/29/2016  .  opirectinal membrane peel    . PEG PLACEMENT    . RHINOPLASTY  1983  . TRACHEOSTOMY    . TRACHEOSTOMY TUBE PLACEMENT N/A 12/15/2016   Procedure: TRACHEOSTOMY CHANGE;  Surgeon: [name] Montane, MD;  Location: [facility];  Service: ENT;  Laterality: N/A;    There were no vitals filed for this visit.  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  No/denies                       [facility] Adult PT Treatment/Exercise - 11/03/17 1420      Transfers   Transfers  Sit to Stand    Sit to Stand  3: Mod assist    Sit to Stand Details  Tactile cues for sequencing;Tactile cues for posture;Tactile cues for weight shifting;Verbal cues for sequencing;Verbal cues for technique;Manual  facilitation for weight shifting;Manual facilitation for placement;Manual facilitation for weight bearing    Stand to Sit  3: Mod assist    Stand Pivot Transfers  3: Mod assist    Stand Pivot Transfer Details (indicate cue type and reason)  wheelchair to SciFit seat; mat to wheelchair at end of session      Neuro Re-ed    Neuro Re-ed Details   Pt stood in standing frame - transferred into standing frame from SciFit seat;  pt was rolled to mirror for visual feedback - tactile and verbal cues to squeeze shoulder blades together for more erect posture; cues to tuck hips for incr. trunk extension and also mod assist provided to decrease Lt lateral trunk lean ;  lowered seat slightly for LE strengthening - cues and assistance for pt to push up through LE's and attempt to tuck hips for extension strengthening and increased upright posture       Knee/Hip Exercises:  Aerobic   Recumbent Bike  Level 1.2 x 8" with LE's and RUE initially; RUE removed and pt used LE's only for last 4"                 PT Short Term Goals - 06/10/17 1305      PT SHORT TERM GOAL #1   Title  Perform sit to/from stand from wheelchair or mat with +1 mod assist.    Baseline  06/09/16: met today    Status  Achieved      PT SHORT TERM GOAL #2   Title  Pt will perform sit to stand from wheelchair to RW with max assist for pre-gait skill.    Baseline  06/09/17: met today    Status  Achieved      PT SHORT TERM GOAL #3   Title  Pt will transfer supine to sit with max assist.    Baseline  06/09/17: met today, needs increased time to assist    Status  Achieved      PT SHORT TERM GOAL #4   Title  Pt will sit with RUE support with min to mod assist unsupported on side of mat for at least 3" to demo improved trunk strength and core stabilization.    Baseline  06/09/17: met today     Status  Achieved      PT SHORT TERM GOAL #5   Title  Pt will amb. 15' with appropriate assistive device with max assist +1    Baseline  06/09/17: pt now needs mod/max assist of 2 people for gait using modified 3 muskateer assist and blue rocker AFO to right LE    Status  Partially Met        PT Long Term Goals - 10/05/17 1042      PT LONG TERM GOAL #3   Title  Pt will amb. 59' with appropriate assistive device with +1 max assist.    Baseline  amb. 46' with Stedy with +2 mod assist with bil. AFO's on 10-04-17    Time  12    Period  Weeks    Status  New    Target Date  12/30/17      PT LONG TERM GOAL #4   Title  Pt will maintain sitting balance with RUE support for at least 5" with SBA for increased independence with ADL's.    Baseline  min assist needed on 10-04-17 for period of 5" seated on side of mat     Time  12  Period  Weeks    Status  New    Target Date  12/30/17      PT LONG TERM GOAL #5   Title  Perform static standing x 5 with bil. UE support with SBA for assistance with  ADL's and for LE weight-bearing and stretching.    Baseline  able to stand 5" with Charlaine Dalton turned backwards     Time  12    Period  Weeks    Status  New    Target Date  12/30/17      PT LONG TERM GOAL #6   Title  Pt will be able to return to participation in therapeutic horseback riding at [facility] for ongoing exercise program. (NEW MAINTAINANCE GOALS:  (Reassess 11-18-17 & 12-30-17)     Baseline  Not returned as of 09/29/17 - pt has had bil. hip surgeries    Time  12    Period  Weeks    Status  On-going    Target Date  12/30/17      PT LONG TERM GOAL #7   Title  Pt will maintain ability to transfer (stand pivot) from varying seated surfaces at max A level to decrease burden of care to single caregiver.   12-30-17 TARGET DATE    Baseline  mod to max A (variable) on 09/29/17    Time  12    Period  Weeks    Status  On-going      PT LONG TERM GOAL #8   Title  Pt will maintain sitting balance with RUE support at min A level x 5 minutes in order to maintain postural and trunk control without external support of w/c in order to indicate improved postural alignment.      Baseline  Pt able to sit at S to min A level with RUE support x 5 mins, requires increased support if RUE not able to support on mat 09/29/17; met 10-04-17    Status  Achieved      PT LONG TERM GOAL  #9   TITLE  Pt will maintain ability to ambulate with +2 max A for 20' in order to faciliate weight bearing to maintain bone density, decrease spasticity, and decrease the progression of contractures.     Baseline  Pt able to ambulate x 25' with +2 max A 09/29/17; +2 mod to max assist with Stedy for 23' on 10-04-17    Status  Achieved      PT LONG TERM GOAL  #10   TITLE  Pt will maintain ability to stand with RUE support x 5 mins at min A level for LE stretching, for improved bowel, bladder and respiratory function.      Baseline  met with standing with Charlaine Dalton turned around backwards on 10-04-17    Status  Achieved            Plan  - 11/03/17 1931    Clinical Impression Statement  Pt's caregiver [name] reports pt has had more difficulty standing upright in [place] (noted during transfers in bathroom) and reports this may be due to pt having received the Botox injections.  Gait training was initially planned with trial of UP walker, but caregiver requested that pt work on standing in standing frame to facilitate more erect standing posture.  Pt able to perform active scapular retraction with tactile and verbal cues; pt had difficulty extending hips in standing position.      Rehab Potential  Fair    Clinical Impairments Affecting  Rehab Potential  severity of deficits with previous extensive therapies    PT Frequency  1x / week    PT Duration  12 weeks    PT Treatment/Interventions  ADLs/Self Care Home Management;Therapeutic exercise;Therapeutic activities;Functional mobility training;Gait training;Balance training;Neuromuscular re-education;Patient/family education;Orthotic Fit/Training;Passive range of motion    PT Next Visit Plan  perform gait in unweighting system if staff available to help; continue with ROM/stretching, standing , transfers, etc.     Consulted and Agree with Plan of Care  Patient;Family member/caregiver    Family Member [employer]       Patient will benefit from skilled therapeutic intervention in order to improve the following deficits and impairments:  Abnormal gait, Decreased activity tolerance, Decreased balance, Decreased cognition, Decreased coordination, Decreased mobility, Decreased range of motion, Decreased endurance, Hypomobility, Decreased strength, Impaired flexibility, Increased muscle spasms, Impaired UE functional use, Postural dysfunction, Pain  Visit Diagnosis: Abnormal posture  Muscle weakness (generalized)  Other symptoms and signs involving the nervous system     Problem List Patient Active Problem List   Diagnosis Date Noted  . Coagulase negative Staphylococcus bacteremia  02/09/2017  . Abdominal distension   . Chronic mucus hypersecretion, respiratory 12/31/2016  . Severe protein-calorie malnutrition [name]: less than 60% of standard weight) ([place]) 12/31/2016  . Acute urinary retention 12/31/2016  . Constipation 12/31/2016  . Shortness of breath   . Respiratory distress   . Pneumonia 12/05/2016  . Acute respiratory failure ([place]) 12/05/2016  . Pseudomonas urinary tract infection 10/27/2016  . Severe muscle deconditioning 09/30/2016  . Pressure injury of skin 09/28/2016  . Respiratory failure ([facility])   . Tracheobronchitis   . Urinary retention   . PEG (percutaneous endoscopic gastrostomy) status ([place])   . Benign essential HTN   . Hyponatremia   . Hypokalemia   . Reactive hypertension   . Respiratory insufficiency 09/24/2016  . Malignant neoplasm of upper-inner quadrant of left breast in female, estrogen receptor positive ([place]) 07/14/2016  . Heterotopic ossification of bone 03/10/2016  . Acute on chronic respiratory failure with hypoxia ([place])   . Acute hypoxemic respiratory failure ([place])   . Acute blood loss anemia   . Atelectasis 02/04/2016  . Debility 02/03/2016  . Pneumonia of both lower lobes due to Pseudomonas species ([place])   . Pneumonia of both lower lobes due to methicillin susceptible Staphylococcus aureus (MSSA) ([place])   . Acute respiratory failure with hypoxia ([place])   . Acute encephalopathy   . Seizures ([place])   . Sepsis ([place])   . Hypoxia   . Pain   . Chronic respiratory failure ([place]) 12/28/2015  . Acute tracheobronchitis 12/24/2015  . Tracheostomy tube present ([facility])   . Tracheal stenosis   . Chronic respiratory failure with hypoxia ([place])   . Spastic tetraplegia ([place]) 10/20/2015  . Tracheostomy status ([facility]) 09/26/2015  . Allergic rhinitis 09/26/2015  . 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 Movement disorder 09/22/2015  . Encephalitis 09/22/2015  . Chest pain 02/07/2014  . Premature atrial contractions 01/13/2014  .  Abnormality of gait 12/04/2013  . Hyperlipidemia 12/21/2011  . Cardiovascular risk factor 12/21/2011  . Bunion 01/31/2008  . Metatarsalgia of both feet 09/20/2007  . FLAT FOOT 09/20/2007  . HALLUX RIGIDUS, ACQUIRED 09/20/2007    Alda Lea, PT 11/03/2017, 7:47 PM  [facility] [address] [place], [place], [address] Phone: [phone]   Fax:  [phone]  Name: Judy Spears MRN: [id number] Date of Birth: 03-08-1950

## 2017-11-03 NOTE — Therapy (Signed)
[facility] [address] [place], [place], [address] Phone: [phone]   Fax:  [phone]  Occupational Therapy Treatment  Patient Details  Name: Judy Spears MRN: [id number] Date of Birth: [birth year] No data recorded  Encounter Date: 11/03/2017  OT End of Session - 11/03/17 1551    Visit Number  31    Number of Visits  50    Date for OT Re-Evaluation  12/20/17    Authorization Type  medicare maintenance program - pt to be reassessed every 30 days per Medicare guidelines  Will need PN every 10th visit    Authorization Time Period  90 days - 12/20/2017    Authorization - Visit Number  51    Authorization - Number of Visits  40    OT Start Time  1316    OT Stop Time  1403    OT Time Calculation (min)  47 min    Activity Tolerance  Patient tolerated treatment well       Past Medical History:  Diagnosis Date  . Arthritis    lt great toe  . Complication of anesthesia    pt has had headaches post op-did advise to hydra well preop  . Hair loss    right-sided  . History of exercise stress test    a. ETT (12/15) with 12:00 exercise, no ST changes, occasional PACs.   . Hyperlipidemia   . Insomnia   . Migraine headache   . Movement disorder    resltess in left legs  . Postmenopausal   . 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 Premature atrial contractions    a. Holter (12/15) with 8% PACs, no atrial runs or atrial fibrillation.     Past Surgical History:  Procedure Laterality Date  . BREAST BIOPSY  01/01/2011   Procedure: BREAST BIOPSY WITH NEEDLE LOCALIZATION;  Surgeon: Rolm Bookbinder, MD;  Location: [facility];  Service: General;  Laterality: Left;  left breast wire localization biopsy  . BREAST BIOPSY Right 07/27/2012  . BREAST BIOPSY Right 07/07/2016  . BREAST BIOPSY Left 06/30/2016  . cataracts right eye    . CESAREAN SECTION  1986  . HERNIA REPAIR  2000   RIH  . INTRAMEDULLARY (IM)  NAIL INTERTROCHANTERIC Left 01/30/2017   Procedure: ORIF AFFIXUS NAIL;  Surgeon: Paralee Cancel, MD;  Location: [facility];  Service: Orthopedics;  Laterality: Left;  . IR REPLC GASTRO/COLONIC TUBE PERCUT W/FLUORO  09/29/2016  . opirectinal membrane peel    . PEG PLACEMENT    . RHINOPLASTY  1983  . TRACHEOSTOMY    . TRACHEOSTOMY TUBE PLACEMENT N/A 12/15/2016   Procedure: TRACHEOSTOMY CHANGE;  Surgeon: [name] Montane, MD;  Location: [facility];  Service: ENT;  Laterality: N/A;    There were no vitals filed for this visit.  Subjective Assessment - 11/03/17 1544    Subjective   That one hurts my wrist ( splint made for R hand by another clinic)    Patient is accompained by:  Family member   caregiver [name]   Pertinent History  see epic snapshot - ABI/encephalitis/hypoxia from virus; recent hospitalization due to MRSA PNA     Patient Stated Goals  Pt unable to state.     Currently in Pain?  No/denies                   OT Treatments/Exercises (OP) - 11/03/17 0001      Splinting   Splinting  Pt's caregiver reported  today that MD gave pt off the shelf padded resting hand splint for night wear for R hand.  Assessed fit and made modifications to improve fit.  Lever arm is short for pt however caregiver [name] educated regarding how to monitor for fit and she will follow through with husband and night time caregivers to monitor.  Caregiver also brought in elbow sleeve with solid stay (off the shelf ) which was also provided for my orthopedic MD for L elbow.  Sleeve does not appear to be truly addressing L elbow contracture.  Discussed with caregiver and she will see how sleeve is being applied at home.  Continued to problem solve for most appropriate splints for L hand - functional day splint  as well  night splint. Began fabrication of modified resting hand splint - will continue to problem solve and address next session.                   OT Long Term Goals - 11/01/17 1633      OT LONG  TERM GOAL #1   Title  Pt will maintain ability to roll in bed during self care tasks with no more than max a x1 - reasssess 11/24/2017    Baseline  10/25/2017 pt able to maintain rolling with max a x1     Status  On-going      OT LONG TERM GOAL #2   Title  Pt will maintain the ability to use roll in shower chair with care giver assistance     Baseline  10/25/2017  pt continues to demonsrate adequate trunk control, sititng balance and decreased tone to use shower chair with care giver assistance.    Status  On-going      OT LONG TERM GOAL #3   Title  Pt will maintain ability to transfer to toilet with no more than max a x1 using Steady Assist - 10/06/2017    Baseline  10/25/2017 pt transfers with steady assist to toilet with max a x1    Status  On-going      OT LONG TERM GOAL #4   Title  Pt wil maintain ability for dressing at max a x1.     Baseline  10/25/2017  pt able to be assisted with dressing by 1 person only at max a level    Status  On-going      OT LONG TERM GOAL #5   Title  Pt will maintain ability to stand with max a x1 to for clothing mgmt with toileting.     Baseline  10/25/2017 pt able to stand with max a x 1 during toileting tasks    Status  On-going      OT LONG TERM GOAL #6   Title  Pt will maintain ability to participate in functional transfers, as well as stand to sit and sit to stand with no more than max a x1    Status  On-going   10/25/2017 pt able to transfer and complete sit to stand and stand to sit with max a x1     OT LONG TERM GOAL #7   Title  Pt will maintain functional mobility and trunk control to aid in her ability to clear secretions and assist with phonation to express basic needs/wants    Status  On-going   10/25/2017  pt has been able to maintain secretions, has had no upper respiratory issues and is able to phonate basic needs/wants  Plan - 11/03/17 1549    Clinical Impression Statement  Pt somewhat alert today. Pt able to indicate where  splints were uncomfortable.     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    Occupational performance deficits (Please refer to evaluation for details):  ADL's;IADL's;Rest and Sleep;Leisure;Social Participation;Work    Physicist, medical    Current Impairments/barriers affecting progress:  severity of multi system deficits, intermittent medical issues    OT Frequency  2x / week    OT Duration  12 weeks    OT Treatment/Interventions  Self-care/ADL training;Ultrasound;Moist Heat;Electrical Stimulation;DME and/or AE instruction;Neuromuscular education;Therapeutic exercise;Functional Mobility Training;Manual Therapy;Passive range of motion;Splinting;Therapeutic activities;Balance training;Patient/family education;Visual/perceptual remediation/compensation;Cognitive remediation/compensation    Plan  continue to problem solve for splinting needs for L hand,  for day and night time wear. follow up with caregiver regarding R hand splint and elbow splint, address rigidity and tone in trunk and extremeties, NMR trunk and LE's for transitional movements, static standing, sitting balance, sit to stand, stand to sit, stepping;    Consulted and Agree with Plan of Care  Patient;Family member/caregiver    Family Member Consulted  caregiver [name]       Patient will benefit from skilled therapeutic intervention in order to improve the following deficits and impairments:  Decreased endurance, Decreased skin integrity, Impaired vision/preception, Improper body mechanics, Impaired perceived functional ability, Decreased knowledge of precautions, Cardiopulmonary status limiting activity, Decreased activity tolerance, Decreased knowledge of use of DME, Decreased strength, Impaired flexibility, Improper  spinal/pelvic alignment, Impaired sensation, Difficulty walking, Decreased mobility, Decreased balance, Decreased cognition, Decreased range of motion, Impaired tone, Impaired UE functional use, Decreased safety awareness, Decreased coordination, Pain, Abnormal gait  Visit Diagnosis: Abnormal posture  Muscle weakness (generalized)  Other symptoms and signs involving the nervous system  Unsteadiness on feet  Frontal lobe and executive function deficit  Stiffness of right shoulder, not elsewhere classified  Stiffness of left shoulder, not elsewhere classified  Stiffness of left elbow, not elsewhere classified    Problem List Patient Active Problem List   Diagnosis Date Noted  . Coagulase negative Staphylococcus bacteremia 02/09/2017  . Abdominal distension   . Chronic mucus hypersecretion, respiratory 12/31/2016  . Severe protein-calorie malnutrition [name]: less than 60% of standard weight) ([place]) 12/31/2016  . Acute urinary retention 12/31/2016  . Constipation 12/31/2016  . Shortness of breath   . Respiratory distress   . Pneumonia 12/05/2016  . Acute respiratory failure ([place]) 12/05/2016  . Pseudomonas urinary tract infection 10/27/2016  . Severe muscle deconditioning 09/30/2016  . Pressure injury of skin 09/28/2016  . Respiratory failure ([place])   . Tracheobronchitis   . Urinary retention   . PEG (percutaneous endoscopic gastrostomy) status ([place])   . Benign essential HTN   . Hyponatremia   . Hypokalemia   . Reactive hypertension   . Respiratory insufficiency 09/24/2016  . Malignant neoplasm of upper-inner quadrant of left breast in female, estrogen receptor positive ([place]) 07/14/2016  . Heterotopic ossification of bone 03/10/2016  . Acute on chronic respiratory failure with hypoxia ([place])   . Acute hypoxemic respiratory failure ([place])   . Acute blood loss anemia   . Atelectasis 02/04/2016  . Debility 02/03/2016  . Pneumonia of both lower lobes due to Pseudomonas  species ([place])   . Pneumonia of both lower lobes due to methicillin  susceptible Staphylococcus aureus (MSSA) ([place])   . Acute respiratory failure with hypoxia ([place])   . Acute encephalopathy   . Seizures ([place])   . Sepsis ([place])   . Hypoxia   . Pain   . Chronic respiratory failure ([place]) 12/28/2015  . Acute tracheobronchitis 12/24/2015  . Tracheostomy tube present ([place])   . Tracheal stenosis   . Chronic respiratory failure with hypoxia ([place])   . Spastic tetraplegia ([place]) 10/20/2015  . Tracheostomy status ([place]) 09/26/2015  . Allergic rhinitis 09/26/2015  . 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 Movement disorder 09/22/2015  . Encephalitis 09/22/2015  . Chest pain 02/07/2014  . Premature atrial contractions 01/13/2014  . Abnormality of gait 12/04/2013  . Hyperlipidemia 12/21/2011  . Cardiovascular risk factor 12/21/2011  . Bunion 01/31/2008  . Metatarsalgia of both feet 09/20/2007  . FLAT FOOT 09/20/2007  . HALLUX RIGIDUS, ACQUIRED 09/20/2007    Quay Burow, OTR/L 11/03/2017, 3:53 PM  [facility] [address] [place], [place], [address] Phone: [phone]   Fax:  [phone]  Name: Judy Spears MRN: [id number] Date of Birth: 11-26-1950

## 2017-11-07 ENCOUNTER — Ambulatory Visit: Payer: Medicare Other | Admitting: Physical Therapy

## 2017-11-07 ENCOUNTER — Ambulatory Visit: Payer: Medicare Other | Admitting: Occupational Therapy

## 2017-11-07 ENCOUNTER — Encounter: Payer: Self-pay | Admitting: Occupational Therapy

## 2017-11-07 DIAGNOSIS — R293 Abnormal posture: Secondary | ICD-10-CM

## 2017-11-07 DIAGNOSIS — M25612 Stiffness of left shoulder, not elsewhere classified: Secondary | ICD-10-CM

## 2017-11-07 DIAGNOSIS — R2681 Unsteadiness on feet: Secondary | ICD-10-CM

## 2017-11-07 DIAGNOSIS — R41844 Frontal lobe and executive function deficit: Secondary | ICD-10-CM

## 2017-11-07 DIAGNOSIS — M25622 Stiffness of left elbow, not elsewhere classified: Secondary | ICD-10-CM

## 2017-11-07 DIAGNOSIS — M25611 Stiffness of right shoulder, not elsewhere classified: Secondary | ICD-10-CM

## 2017-11-07 DIAGNOSIS — R278 Other lack of coordination: Secondary | ICD-10-CM

## 2017-11-07 DIAGNOSIS — M6281 Muscle weakness (generalized): Secondary | ICD-10-CM

## 2017-11-07 NOTE — Therapy (Signed)
[facility] [address] [place], [place], [address] Phone: [phone]   Fax:  [phone]  Occupational Therapy Treatment  Patient Details  Name: Judy Spears MRN: [id number] Date of Birth: 06/06/1950 No data recorded  Encounter Date: 11/07/2017  OT End of Session - 11/07/17 1653    Visit Number  32    Number of Visits  31    Date for OT Re-Evaluation  12/20/17    Authorization Type  medicare maintenance program - pt to be reassessed every 30 days per Medicare guidelines  Will need PN every 10th visit    Authorization Time Period  90 days - 12/20/2017    Authorization - Visit Number  18    Authorization - Number of Visits  40    OT Start Time  4585    OT Stop Time  1532    OT Time Calculation (min)  45 min    Activity Tolerance  Patient tolerated treatment well    Behavior During Therapy  [facility] for tasks assessed/performed       Past Medical History:  Diagnosis Date  . Arthritis    lt great toe  . Complication of anesthesia    pt has had headaches post op-did advise to hydra well preop  . Hair loss    right-sided  . History of exercise stress test    a. ETT (12/15) with 12:00 exercise, no ST changes, occasional PACs.   . Hyperlipidemia   . Insomnia   . Migraine headache   . Movement disorder    resltess in left legs  . Postmenopausal   . 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 Premature atrial contractions    a. Holter (12/15) with 8% PACs, no atrial runs or atrial fibrillation.     Past Surgical History:  Procedure Laterality Date  . BREAST BIOPSY  01/01/2011   Procedure: BREAST BIOPSY WITH NEEDLE LOCALIZATION;  Surgeon: Rolm Bookbinder, MD;  Location: [facility];  Service: General;  Laterality: Left;  left breast wire localization biopsy  . BREAST BIOPSY Right 07/27/2012  . BREAST BIOPSY Right 07/07/2016  . BREAST BIOPSY Left 06/30/2016  . cataracts right eye    . CESAREAN SECTION   1986  . HERNIA REPAIR  2000   RIH  . INTRAMEDULLARY (IM) NAIL INTERTROCHANTERIC Left 01/30/2017   Procedure: ORIF AFFIXUS NAIL;  Surgeon: Paralee Cancel, MD;  Location: [facility];  Service: Orthopedics;  Laterality: Left;  . IR REPLC GASTRO/COLONIC TUBE PERCUT W/FLUORO  09/29/2016  . opirectinal membrane peel    . PEG PLACEMENT    . RHINOPLASTY  1983  . TRACHEOSTOMY    . TRACHEOSTOMY TUBE PLACEMENT N/A 12/15/2016   Procedure: TRACHEOSTOMY CHANGE;  Surgeon: [name] Montane, MD;  Location: [facility];  Service: ENT;  Laterality: N/A;    There were no vitals filed for this visit.  Subjective Assessment - 11/07/17 1646    Subjective   "Maybe the Botox?"  Regarding why right thigh was swollen above knee.      Patient is accompained by:  Family member   caregiver [name] and husband [name]   Pertinent History  see epic snapshot - ABI/encephalitis/hypoxia from virus; recent hospitalization due to MRSA PNA     Patient Stated Goals  Pt unable to state.     Currently in Pain?  No/denies    Pain Score  0-No pain  OT Treatments/Exercises (OP) - 11/07/17 0001      Splinting   Splinting  Patient's husband came in today to show how he has been putting on night tinme elbow brace.  Husband indicated that he thought brace was meant to block her from further elbow flexion, however, when brace put on today and stay aligned at lateral elbow - patient able to further flex elbow unopposed.  Patient's husband and caregiver were going to try aircast next to see if that reduced elbow flexion at night time.  Initiated fabrication of day time wrist splints to help promote more neutral alignment of wrists, to promote more arch in hands, and to allow freedom of fingers for grasp, release, and to prevent overstretch.  Splints not yet issued to patient and family although initiated education on donning / doffing technique with patient, caregiver and husband.               OT Education - 11/07/17 1652     Education provided  Yes    Education Details  education regarding elbow bracing options. splint donning / doffing technique    Person(s) Educated  Patient;Caregiver(s);Spouse    Methods  Explanation;Demonstration;Tactile cues;Verbal cues    Comprehension  Verbalized understanding       OT Short Term Goals - 10/27/17 1653      OT SHORT TERM GOAL #6   Status  --        OT Long Term Goals - 11/01/17 1633      OT LONG TERM GOAL #1   Title  Pt will maintain ability to roll in bed during self care tasks with no more than max a x1 - reasssess 11/24/2017    Baseline  10/25/2017 pt able to maintain rolling with max a x1     Status  On-going      OT LONG TERM GOAL #2   Title  Pt will maintain the ability to use roll in shower chair with care giver assistance     Baseline  10/25/2017  pt continues to demonsrate adequate trunk control, sititng balance and decreased tone to use shower chair with care giver assistance.    Status  On-going      OT LONG TERM GOAL #3   Title  Pt will maintain ability to transfer to toilet with no more than max a x1 using Steady Assist - 10/06/2017    Baseline  10/25/2017 pt transfers with steady assist to toilet with max a x1    Status  On-going      OT LONG TERM GOAL #4   Title  Pt wil maintain ability for dressing at max a x1.     Baseline  10/25/2017  pt able to be assisted with dressing by 1 person only at max a level    Status  On-going      OT LONG TERM GOAL #5   Title  Pt will maintain ability to stand with max a x1 to for clothing mgmt with toileting.     Baseline  10/25/2017 pt able to stand with max a x 1 during toileting tasks    Status  On-going      OT LONG TERM GOAL #6   Title  Pt will maintain ability to participate in functional transfers, as well as stand to sit and sit to stand with no more than max a x1    Status  On-going   10/25/2017 pt able to transfer and complete sit to stand and stand  to sit with max a x1     OT LONG TERM GOAL #7    Title  Pt will maintain functional mobility and trunk control to aid in her ability to clear secretions and assist with phonation to express basic needs/wants    Status  On-going   10/25/2017  pt has been able to maintain secretions, has had no upper respiratory issues and is able to phonate basic needs/wants           Plan - 11/07/17 1653    Clinical Impression Statement  Working to refabricate UE bracing and splinting to prevent further deformity and maximize functional use of UE's    Occupational Profile and client history currently impacting functional performance  12/12 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    Occupational performance deficits (Please refer to evaluation for details):  ADL's;IADL's;Rest and Sleep;Leisure;Social Participation;Work    Physicist, medical    Current Impairments/barriers affecting progress:  severity of multi system deficits, intermittent medical issues    OT Frequency  2x / week    OT Duration  12 weeks    OT Treatment/Interventions  Self-care/ADL training;Ultrasound;Moist Heat;Electrical Stimulation;DME and/or AE instruction;Neuromuscular education;Therapeutic exercise;Functional Mobility Training;Manual Therapy;Passive range of motion;Splinting;Therapeutic activities;Balance training;Patient/family education;Visual/perceptual remediation/compensation;Cognitive remediation/compensation    Plan  continue to problem solve for splinting needs for L hand,  for day and night time wear. follow up with caregiver regarding R hand splint and elbow splint, address rigidity and tone in trunk and extremeties, NMR trunk and LE's for transitional movements, static standing, sitting balance, sit to stand, stand to sit, stepping;    Consulted and Agree with Plan of Care  Patient;Family member/caregiver    Family Member Consulted   caregiver [name], husband [name]       Patient will benefit from skilled therapeutic intervention in order to improve the following deficits and impairments:  Decreased endurance, Decreased skin integrity, Impaired vision/preception, Improper body mechanics, Impaired perceived functional ability, Decreased knowledge of precautions, Cardiopulmonary status limiting activity, Decreased activity tolerance, Decreased knowledge of use of DME, Decreased strength, Impaired flexibility, Improper spinal/pelvic alignment, Impaired sensation, Difficulty walking, Decreased mobility, Decreased balance, Decreased cognition, Decreased range of motion, Impaired tone, Impaired UE functional use, Decreased safety awareness, Decreased coordination, Pain, Abnormal gait  Visit Diagnosis: Abnormal posture  Muscle weakness (generalized)  Unsteadiness on feet  Frontal lobe and executive function deficit  Other lack of coordination  Stiffness of right shoulder, not elsewhere classified  Stiffness of left shoulder, not elsewhere classified  Stiffness of left elbow, not elsewhere classified    Problem List Patient Active Problem List   Diagnosis Date Noted  . Coagulase negative Staphylococcus bacteremia 02/09/2017  . Abdominal distension   . Chronic mucus hypersecretion, respiratory 12/31/2016  . Severe protein-calorie malnutrition [name]: less than 60% of standard weight) ([place]) 12/31/2016  . Acute urinary retention 12/31/2016  . Constipation 12/31/2016  . Shortness of breath   . Respiratory distress   . Pneumonia 12/05/2016  . Acute respiratory failure ([place]) 12/05/2016  . Pseudomonas urinary tract infection 10/27/2016  . Severe muscle deconditioning 09/30/2016  . Pressure injury of skin 09/28/2016  . Respiratory failure ([place])   . Tracheobronchitis   . Urinary retention   . PEG (percutaneous endoscopic gastrostomy) status ([place])   . Benign essential HTN   . Hyponatremia   . Hypokalemia   . Reactive  hypertension   .  Respiratory insufficiency 09/24/2016  . Malignant neoplasm of upper-inner quadrant of left breast in female, estrogen receptor positive ([place]) 07/14/2016  . Heterotopic ossification of bone 03/10/2016  . Acute on chronic respiratory failure with hypoxia ([place])   . Acute hypoxemic respiratory failure ([place])   . Acute blood loss anemia   . Atelectasis 02/04/2016  . Debility 02/03/2016  . Pneumonia of both lower lobes due to Pseudomonas species ([place])   . Pneumonia of both lower lobes due to methicillin susceptible Staphylococcus aureus (MSSA) ([place])   . Acute respiratory failure with hypoxia ([place])   . Acute encephalopathy   . Seizures ([place])   . Sepsis ([place])   . Hypoxia   . Pain   . Chronic respiratory failure ([place]) 12/28/2015  . Acute tracheobronchitis 12/24/2015  . Tracheostomy tube present ([place])   . Tracheal stenosis   . Chronic respiratory failure with hypoxia ([place])   . Spastic tetraplegia ([place]) 10/20/2015  . Tracheostomy status ([place]) 09/26/2015  . Allergic rhinitis 09/26/2015  . 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 Movement disorder 09/22/2015  . Encephalitis 09/22/2015  . Chest pain 02/07/2014  . Premature atrial contractions 01/13/2014  . Abnormality of gait 12/04/2013  . Hyperlipidemia 12/21/2011  . Cardiovascular risk factor 12/21/2011  . Bunion 01/31/2008  . Metatarsalgia of both feet 09/20/2007  . FLAT FOOT 09/20/2007  . HALLUX RIGIDUS, ACQUIRED 09/20/2007    Mariah Milling , OTR/L 11/07/2017, 4:56 PM  [facility] [address] [place], [place], [address] Phone: [phone]   Fax:  [phone]  Name: Wreatha Sturgeon MRN: [id number] Date of Birth: 1950/11/13

## 2017-11-08 ENCOUNTER — Ambulatory Visit: Payer: Medicare Other | Admitting: Rehabilitation

## 2017-11-08 ENCOUNTER — Ambulatory Visit: Payer: Medicare Other | Admitting: Occupational Therapy

## 2017-11-10 ENCOUNTER — Ambulatory Visit: Payer: Medicare Other | Admitting: Occupational Therapy

## 2017-11-10 ENCOUNTER — Encounter: Payer: Self-pay | Admitting: Occupational Therapy

## 2017-11-10 DIAGNOSIS — M6281 Muscle weakness (generalized): Secondary | ICD-10-CM

## 2017-11-10 DIAGNOSIS — R41844 Frontal lobe and executive function deficit: Secondary | ICD-10-CM

## 2017-11-10 DIAGNOSIS — M25611 Stiffness of right shoulder, not elsewhere classified: Secondary | ICD-10-CM

## 2017-11-10 DIAGNOSIS — R293 Abnormal posture: Secondary | ICD-10-CM | POA: Diagnosis not present

## 2017-11-10 DIAGNOSIS — R2681 Unsteadiness on feet: Secondary | ICD-10-CM

## 2017-11-10 DIAGNOSIS — M25612 Stiffness of left shoulder, not elsewhere classified: Secondary | ICD-10-CM

## 2017-11-10 DIAGNOSIS — M25622 Stiffness of left elbow, not elsewhere classified: Secondary | ICD-10-CM

## 2017-11-10 DIAGNOSIS — R29818 Other symptoms and signs involving the nervous system: Secondary | ICD-10-CM

## 2017-11-10 NOTE — Therapy (Signed)
[facility] [address] [place], [place], [address] Phone: [phone]   Fax:  [phone]  Occupational Therapy Treatment  Patient Details  Name: Judy Spears MRN: [id number] Date of Birth: 21-Apr-1950 No data recorded  Encounter Date: 11/10/2017  OT End of Session - 11/10/17 1528    Visit Number  33    Number of Visits  44    Date for OT Re-Evaluation  12/20/17    Authorization Type  medicare maintenance program - pt to be reassessed every 30 days per Medicare guidelines  Will need PN every 10th visit    Authorization Time Period  90 days - 12/20/2017    Authorization - Visit Number  59    Authorization - Number of Visits  40    OT Start Time  1316    OT Stop Time  1400    OT Time Calculation (min)  44 min    Activity Tolerance  Patient tolerated treatment well       Past Medical History:  Diagnosis Date  . Arthritis    lt great toe  . Complication of anesthesia    pt has had headaches post op-did advise to hydra well preop  . Hair loss    right-sided  . History of exercise stress test    a. ETT (12/15) with 12:00 exercise, no ST changes, occasional PACs.   . Hyperlipidemia   . Insomnia   . Migraine headache   . Movement disorder    resltess in left legs  . Postmenopausal   . 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 Premature atrial contractions    a. Holter (12/15) with 8% PACs, no atrial runs or atrial fibrillation.     Past Surgical History:  Procedure Laterality Date  . BREAST BIOPSY  01/01/2011   Procedure: BREAST BIOPSY WITH NEEDLE LOCALIZATION;  Surgeon: Rolm Bookbinder, MD;  Location: [facility];  Service: General;  Laterality: Left;  left breast wire localization biopsy  . BREAST BIOPSY Right 07/27/2012  . BREAST BIOPSY Right 07/07/2016  . BREAST BIOPSY Left 06/30/2016  . cataracts right eye    . CESAREAN SECTION  1986  . HERNIA REPAIR  2000   RIH  . INTRAMEDULLARY (IM)  NAIL INTERTROCHANTERIC Left 01/30/2017   Procedure: ORIF AFFIXUS NAIL;  Surgeon: Paralee Cancel, MD;  Location: [facility];  Service: Orthopedics;  Laterality: Left;  . IR REPLC GASTRO/COLONIC TUBE PERCUT W/FLUORO  09/29/2016  . opirectinal membrane peel    . PEG PLACEMENT    . RHINOPLASTY  1983  . TRACHEOSTOMY    . TRACHEOSTOMY TUBE PLACEMENT N/A 12/15/2016   Procedure: TRACHEOSTOMY CHANGE;  Surgeon: [name] Montane, MD;  Location: [facility];  Service: ENT;  Laterality: N/A;    There were no vitals filed for this visit.  Subjective Assessment - 11/10/17 1321    Subjective   Pt with decreased arousal today - husband reports upper respiratory infection.    Patient is accompained by:  Family member   husband [name], caregiver [name]   Pertinent History  see epic snapshot - ABI/encephalitis/hypoxia from virus; recent hospitalization due to MRSA PNA     Patient Stated Goals  Pt unable to state.     Currently in Pain?  No/denies                   OT Treatments/Exercises (OP) - 11/10/17 0001      Splinting   Splinting  Completed fabrication of day  splints for both hands.  Pt to wear during the day to assist in realigning wrists to allow for increased ability for hand function.  Attempted to compelte resting hand splint that pt will wear at night on L hand - pt's tone signficantly increased today - likely due to upper respirtory infection.  Will wait until infection clears (pt is on strong antibiotic) to complete fabrication of resting hand splint for LUE.  Pt has night time splint for R hand.  Educated  husband and caregiver on wearing schedule and care as well as how to monitor tolerance.  See pt instruction section for details.  Pt and husband able to verbalize understanding.              OT Education - 11/10/17 1527    Education provided  Yes    Education Details  splint wear and care    Person(s) Educated  Spouse;Caregiver(s)    Methods  Explanation;Demonstration;Handout     Comprehension  Verbalized understanding;Returned demonstration          OT Long Term Goals - 11/01/17 1633      OT LONG TERM GOAL #1   Title  Pt will maintain ability to roll in bed during self care tasks with no more than max a x1 - reasssess 11/24/2017    Baseline  10/25/2017 pt able to maintain rolling with max a x1     Status  On-going      OT LONG TERM GOAL #2   Title  Pt will maintain the ability to use roll in shower chair with care giver assistance     Baseline  10/25/2017  pt continues to demonsrate adequate trunk control, sititng balance and decreased tone to use shower chair with care giver assistance.    Status  On-going      OT LONG TERM GOAL #3   Title  Pt will maintain ability to transfer to toilet with no more than max a x1 using Steady Assist - 10/06/2017    Baseline  10/25/2017 pt transfers with steady assist to toilet with max a x1    Status  On-going      OT LONG TERM GOAL #4   Title  Pt wil maintain ability for dressing at max a x1.     Baseline  10/25/2017  pt able to be assisted with dressing by 1 person only at max a level    Status  On-going      OT LONG TERM GOAL #5   Title  Pt will maintain ability to stand with max a x1 to for clothing mgmt with toileting.     Baseline  10/25/2017 pt able to stand with max a x 1 during toileting tasks    Status  On-going      OT LONG TERM GOAL #6   Title  Pt will maintain ability to participate in functional transfers, as well as stand to sit and sit to stand with no more than max a x1    Status  On-going   10/25/2017 pt able to transfer and complete sit to stand and stand to sit with max a x1     OT LONG TERM GOAL #7   Title  Pt will maintain functional mobility and trunk control to aid in her ability to clear secretions and assist with phonation to express basic needs/wants    Status  On-going   10/25/2017  pt has been able to maintain secretions, has had no upper respiratory issues  and is able to phonate basic  needs/wants           Plan - 11/10/17 1527    Clinical Impression Statement  Pt sick with upper respiratory infection today - pt with very poor arousal today.      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    Occupational performance deficits (Please refer to evaluation for details):  ADL's;IADL's;Rest and Sleep;Leisure;Social Participation;Work    Physicist, medical    Current Impairments/barriers affecting progress:  severity of multi system deficits, intermittent medical issues    OT Frequency  2x / week    OT Duration  12 weeks    OT Treatment/Interventions  Self-care/ADL training;Ultrasound;Moist Heat;Electrical Stimulation;DME and/or AE instruction;Neuromuscular education;Therapeutic exercise;Functional Mobility Training;Manual Therapy;Passive range of motion;Splinting;Therapeutic activities;Balance training;Patient/family education;Visual/perceptual remediation/compensation;Cognitive remediation/compensation    Plan  complete resting hand splint for L hand if possible, address rigidity and tone in trunk and extremeties, NMR trunk and LE's for transitional movements, static standing, sitting balance, sit to stand, stand to sit, stepping;    Consulted and Agree with Plan of Care  Patient;Family member/caregiver    Family Member Consulted  caregiver [name], husband [name]       Patient will benefit from skilled therapeutic intervention in order to improve the following deficits and impairments:  Decreased endurance, Decreased skin integrity, Impaired vision/preception, Improper body mechanics, Impaired perceived functional ability, Decreased knowledge of precautions, Cardiopulmonary status limiting activity, Decreased activity tolerance, Decreased knowledge of use of DME,  Decreased strength, Impaired flexibility, Improper spinal/pelvic alignment, Impaired sensation, Difficulty walking, Decreased mobility, Decreased balance, Decreased cognition, Decreased range of motion, Impaired tone, Impaired UE functional use, Decreased safety awareness, Decreased coordination, Pain, Abnormal gait  Visit Diagnosis: Abnormal posture  Muscle weakness (generalized)  Unsteadiness on feet  Frontal lobe and executive function deficit  Stiffness of right shoulder, not elsewhere classified  Stiffness of left shoulder, not elsewhere classified  Stiffness of left elbow, not elsewhere classified  Other symptoms and signs involving the nervous system    Problem List Patient Active Problem List   Diagnosis Date Noted  . Coagulase negative Staphylococcus bacteremia 02/09/2017  . Abdominal distension   . Chronic mucus hypersecretion, respiratory 12/31/2016  . Severe protein-calorie malnutrition [name]: less than 60% of standard weight) ([place]) 12/31/2016  . Acute urinary retention 12/31/2016  . Constipation 12/31/2016  . Shortness of breath   . Respiratory distress   . Pneumonia 12/05/2016  . Acute respiratory failure ([place]) 12/05/2016  . Pseudomonas urinary tract infection 10/27/2016  . Severe muscle deconditioning 09/30/2016  . Pressure injury of skin 09/28/2016  . Respiratory failure ([place])   . Tracheobronchitis   . Urinary retention   . PEG (percutaneous endoscopic gastrostomy) status ([place])   . Benign essential HTN   . Hyponatremia   . Hypokalemia   . Reactive hypertension   . Respiratory insufficiency 09/24/2016  . Malignant neoplasm of upper-inner quadrant of left breast in female, estrogen receptor positive ([place]) 07/14/2016  . Heterotopic ossification of bone 03/10/2016  . Acute on chronic respiratory failure with hypoxia ([place])   . Acute hypoxemic respiratory failure ([place])   . Acute blood loss anemia   . Atelectasis 02/04/2016  . Debility 02/03/2016  .  Pneumonia of both lower lobes due to Pseudomonas species ([place])   . Pneumonia of both lower lobes  due to methicillin susceptible Staphylococcus aureus (MSSA) ([place])   . Acute respiratory failure with hypoxia ([place])   . Acute encephalopathy   . Seizures ([place])   . Sepsis ([place])   . Hypoxia   . Pain   . Chronic respiratory failure ([place]) 12/28/2015  . Acute tracheobronchitis 12/24/2015  . Tracheostomy tube present ([place])   . Tracheal stenosis   . Chronic respiratory failure with hypoxia ([place])   . 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 Tracheostomy status ([place]) 09/26/2015  . Allergic rhinitis 09/26/2015  . 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 Movement disorder 09/22/2015  . Encephalitis 09/22/2015  . Chest pain 02/07/2014  . Premature atrial contractions 01/13/2014  . Abnormality of gait 12/04/2013  . Hyperlipidemia 12/21/2011  . Cardiovascular risk factor 12/21/2011  . Bunion 01/31/2008  . Metatarsalgia of both feet 09/20/2007  . FLAT FOOT 09/20/2007  . HALLUX RIGIDUS, ACQUIRED 09/20/2007    Quay Burow, OTR/L 11/10/2017, 3:30 PM  [facility] [address] [place], [place], [address] Phone: [phone]   Fax:  [phone]  Name: Kilah Drahos MRN: [id number] Date of Birth: 06-29-1950

## 2017-11-10 NOTE — Patient Instructions (Signed)
Your Splint This splint should initially be fitted by a healthcare practitioner.  The healthcare practitioner is responsible for providing wearing instructions and precautions to the patient, other healthcare practitioners and care provider involved in the patient's care.  This splint was custom made for you. Please read the following instructions to learn about wearing and caring for your splint.  Precautions Should your splint cause any of the following problems, remove the splint immediately and contact your therapist/physician.  Swelling  Severe Pain  Pressure Areas  Stiffness  Numbness  Do not wear your splint while operating machinery unless it has been fabricated for that purpose.  When To Wear Your Splint To start where these splints 30 minutes in the morning and 30 minutes in the afternoon - monitor her skin.  Do this for 2-3 days. If she has no issues can then: Where 1 hour in the morning and 1 hour in the afternoon for the next 2-3 days. If no issues then: 2 hours in the morning and 2 hours in the afternoon.  Do this for 3-4 days. If no issues then: Go to 3 hours 2 times per day and then up to 4 hours 2 times per day.   She will wear the blue splint at night on the left hand and when her infection is better, I will finish the left resting night splint.    Care and Cleaning of Your Splint 1. Keep your splint away from open flames. 2. Your splint will lose its shape in temperatures over 135 degrees Farenheit, ( in car windows, near radiators, ovens or in hot water).  Never make any adjustments to your splint, if the splint needs adjusting remove it and make an appointment to see your therapist. 3. Your splint, including the cushion liner may be cleaned with soap and lukewarm water.  Do not immerse in hot water over 135 degrees Farenheit. 4. Straps may be washed with soap and water, but do not moisten the self-adhesive portion. 5. For ink or hard to remove spots use a scouring  cleanser which contains chlorine.  Rinse the splint thoroughly after using chlorine cleanser.

## 2017-11-15 ENCOUNTER — Encounter: Payer: Self-pay | Admitting: Occupational Therapy

## 2017-11-15 ENCOUNTER — Ambulatory Visit (INDEPENDENT_AMBULATORY_CARE_PROVIDER_SITE_OTHER): Payer: Medicare Other | Admitting: Adult Health

## 2017-11-15 ENCOUNTER — Encounter: Payer: Self-pay | Admitting: Adult Health

## 2017-11-15 ENCOUNTER — Ambulatory Visit: Payer: Medicare Other | Admitting: Occupational Therapy

## 2017-11-15 ENCOUNTER — Other Ambulatory Visit: Payer: Medicare Other

## 2017-11-15 ENCOUNTER — Ambulatory Visit (INDEPENDENT_AMBULATORY_CARE_PROVIDER_SITE_OTHER)
Admission: RE | Admit: 2017-11-15 | Discharge: 2017-11-15 | Disposition: A | Discharge status: Home / Self Care | Payer: Medicare Other | Source: Ambulatory Visit | Attending: Adult Health | Admitting: Adult Health

## 2017-11-15 VITALS — BP 108/68 | HR 74 | Temp 97.3°F

## 2017-11-15 DIAGNOSIS — Z930 Tracheostomy status: Secondary | ICD-10-CM

## 2017-11-15 DIAGNOSIS — J40 Bronchitis, not specified as acute or chronic: Secondary | ICD-10-CM

## 2017-11-15 DIAGNOSIS — J209 Acute bronchitis, unspecified: Secondary | ICD-10-CM

## 2017-11-15 DIAGNOSIS — R41844 Frontal lobe and executive function deficit: Secondary | ICD-10-CM

## 2017-11-15 DIAGNOSIS — R293 Abnormal posture: Secondary | ICD-10-CM

## 2017-11-15 DIAGNOSIS — M25611 Stiffness of right shoulder, not elsewhere classified: Secondary | ICD-10-CM

## 2017-11-15 DIAGNOSIS — M25612 Stiffness of left shoulder, not elsewhere classified: Secondary | ICD-10-CM

## 2017-11-15 DIAGNOSIS — R29818 Other symptoms and signs involving the nervous system: Secondary | ICD-10-CM

## 2017-11-15 DIAGNOSIS — R278 Other lack of coordination: Secondary | ICD-10-CM

## 2017-11-15 DIAGNOSIS — J9610 Chronic respiratory failure, unspecified whether with hypoxia or hypercapnia: Secondary | ICD-10-CM

## 2017-11-15 DIAGNOSIS — M6281 Muscle weakness (generalized): Secondary | ICD-10-CM

## 2017-11-15 DIAGNOSIS — M25622 Stiffness of left elbow, not elsewhere classified: Secondary | ICD-10-CM

## 2017-11-15 DIAGNOSIS — R2681 Unsteadiness on feet: Secondary | ICD-10-CM

## 2017-11-15 NOTE — Therapy (Signed)
[facility] [address] [place], [place], [address] Phone: [phone]   Fax:  [phone]  Occupational Therapy Treatment  Patient Details  Name: Judy Spears MRN: [id number] Date of Birth: Nov 26, 1950 No data recorded  Encounter Date: 11/15/2017  OT End of Session - 11/15/17 1649    Visit Number  34    Number of Visits  44    Date for OT Re-Evaluation  12/20/17    Authorization Type  medicare maintenance program - pt to be reassessed every 30 days per Medicare guidelines  Will need PN every 10th visit    Authorization Time Period  90 days - 12/20/2017    Authorization - Visit Number  43    Authorization - Number of Visits  40    OT Start Time  1884    OT Stop Time  1533    OT Time Calculation (min)  46 min    Activity Tolerance  Patient tolerated treatment well       Past Medical History:  Diagnosis Date  . Arthritis    lt great toe  . Complication of anesthesia    pt has had headaches post op-did advise to hydra well preop  . Hair loss    right-sided  . History of exercise stress test    a. ETT (12/15) with 12:00 exercise, no ST changes, occasional PACs.   . Hyperlipidemia   . Insomnia   . Migraine headache   . Movement disorder    resltess in left legs  . Postmenopausal   . 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 Premature atrial contractions    a. Holter (12/15) with 8% PACs, no atrial runs or atrial fibrillation.     Past Surgical History:  Procedure Laterality Date  . BREAST BIOPSY  01/01/2011   Procedure: BREAST BIOPSY WITH NEEDLE LOCALIZATION;  Surgeon: Rolm Bookbinder, MD;  Location: [facility];  Service: General;  Laterality: Left;  left breast wire localization biopsy  . BREAST BIOPSY Right 07/27/2012  . BREAST BIOPSY Right 07/07/2016  . BREAST BIOPSY Left 06/30/2016  . cataracts right eye    . CESAREAN SECTION  1986  . HERNIA REPAIR  2000   RIH  . INTRAMEDULLARY (IM)  NAIL INTERTROCHANTERIC Left 01/30/2017   Procedure: ORIF AFFIXUS NAIL;  Surgeon: Paralee Cancel, MD;  Location: [facility];  Service: Orthopedics;  Laterality: Left;  . IR REPLC GASTRO/COLONIC TUBE PERCUT W/FLUORO  09/29/2016  . opirectinal membrane peel    . PEG PLACEMENT    . RHINOPLASTY  1983  . TRACHEOSTOMY    . TRACHEOSTOMY TUBE PLACEMENT N/A 12/15/2016   Procedure: TRACHEOSTOMY CHANGE;  Surgeon: [name] Montane, MD;  Location: [facility];  Service: ENT;  Laterality: N/A;    There were no vitals filed for this visit.  Subjective Assessment - 11/15/17 1453    Subjective   Pt denies any pain today however caregiver reports pt still c/o of sore throat and intermittent headache. Pt saw pulmonologist today and will ENT specialist as soon as family can get an appt    Patient is accompained by:  Family member   caregiver [name]   Pertinent History  see epic snapshot - ABI/encephalitis/hypoxia from virus; recent hospitalization due to MRSA PNA     Patient Stated Goals  Pt unable to state.     Currently in Pain?  No/denies  OT Treatments/Exercises (OP) - 11/15/17 0001      Splinting   Splinting  Caregiver working with pt at home to build up tolerance and begin functional hand use using splints for both hands.  Pt had small area on L wrist - made adjustement and padded splint.  Pt also had small area on R thumb howerver this did not appear to be caused by custom day splint as red area was on top of thumb and custom splint does not touch this area.  Suspect off the shelf resting hand splint that pt wears at night may have caused this as pt tends to squeeze thumb and side of index finger together when something is in palm - this resting hand splint was issued to pt by Dr. Amedeo Plenty.  Suggested to husband that he stop using splint tempororarily until red area improves and then to bring splint in to determine if further adjustment can resolve issue.  Also suggested they do this for functional  hand splint for L hand and then begin build up for tolerance again.  Husband and caregiver verbalized understanding.  Continued with fabrication of resting hand splint (cutsom) for L hand for night wear.  Will wait to issue until pt has built up tolerance for day splints - husband and caregiver [name] in agreement. WIl complete splint next session.                OT Short Term Goals - 10/27/17 1653      OT SHORT TERM GOAL #6   Status  --        OT Long Term Goals - 11/01/17 1633      OT LONG TERM GOAL #1   Title  Pt will maintain ability to roll in bed during self care tasks with no more than max a x1 - reasssess 11/24/2017    Baseline  10/25/2017 pt able to maintain rolling with max a x1     Status  On-going      OT LONG TERM GOAL #2   Title  Pt will maintain the ability to use roll in shower chair with care giver assistance     Baseline  10/25/2017  pt continues to demonsrate adequate trunk control, sititng balance and decreased tone to use shower chair with care giver assistance.    Status  On-going      OT LONG TERM GOAL #3   Title  Pt will maintain ability to transfer to toilet with no more than max a x1 using Steady Assist - 10/06/2017    Baseline  10/25/2017 pt transfers with steady assist to toilet with max a x1    Status  On-going      OT LONG TERM GOAL #4   Title  Pt wil maintain ability for dressing at max a x1.     Baseline  10/25/2017  pt able to be assisted with dressing by 1 person only at max a level    Status  On-going      OT LONG TERM GOAL #5   Title  Pt will maintain ability to stand with max a x1 to for clothing mgmt with toileting.     Baseline  10/25/2017 pt able to stand with max a x 1 during toileting tasks    Status  On-going      OT LONG TERM GOAL #6   Title  Pt will maintain ability to participate in functional transfers, as well as stand to sit and sit to stand  with no more than max a x1    Status  On-going   10/25/2017 pt able to transfer and  complete sit to stand and stand to sit with max a x1     OT LONG TERM GOAL #7   Title  Pt will maintain functional mobility and trunk control to aid in her ability to clear secretions and assist with phonation to express basic needs/wants    Status  On-going   10/25/2017  pt has been able to maintain secretions, has had no upper respiratory issues and is able to phonate basic needs/wants           Plan - 11/15/17 1648    Clinical Impression Statement  Pt still not feeling well today - caregiver reports they saw the pulmonologist this morning and will see ENT as soon as possible.     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    Occupational performance deficits (Please refer to evaluation for details):  ADL's;IADL's;Rest and Sleep;Leisure;Social Participation;Work    Physicist, medical    Current Impairments/barriers affecting progress:  severity of multi system deficits, intermittent medical issues    OT Frequency  2x / week    OT Duration  12 weeks    OT Treatment/Interventions  Self-care/ADL training;Ultrasound;Moist Heat;Electrical Stimulation;DME and/or AE instruction;Neuromuscular education;Therapeutic exercise;Functional Mobility Training;Manual Therapy;Passive range of motion;Splinting;Therapeutic activities;Balance training;Patient/family education;Visual/perceptual remediation/compensation;Cognitive remediation/compensation    Plan  complete resting hand splint for L hand if possible, address rigidity and tone in trunk and extremeties, NMR trunk and LE's for transitional movements, static standing, sitting balance, sit to stand, stand to sit, stepping;    Consulted and Agree with Plan of Care  Patient;Family member/caregiver    Family Member Consulted  caregiver  [name], husband [name]       Patient will benefit from skilled therapeutic intervention in order to improve the following deficits and impairments:  Decreased endurance, Decreased skin integrity, Impaired vision/preception, Improper body mechanics, Impaired perceived functional ability, Decreased knowledge of precautions, Cardiopulmonary status limiting activity, Decreased activity tolerance, Decreased knowledge of use of DME, Decreased strength, Impaired flexibility, Improper spinal/pelvic alignment, Impaired sensation, Difficulty walking, Decreased mobility, Decreased balance, Decreased cognition, Decreased range of motion, Impaired tone, Impaired UE functional use, Decreased safety awareness, Decreased coordination, Pain, Abnormal gait  Visit Diagnosis: Abnormal posture  Muscle weakness (generalized)  Unsteadiness on feet  Frontal lobe and executive function deficit  Stiffness of right shoulder, not elsewhere classified  Stiffness of left shoulder, not elsewhere classified  Stiffness of left elbow, not elsewhere classified  Other symptoms and signs involving the nervous system  Other lack of coordination    Problem List Patient Active Problem List   Diagnosis Date Noted  . Coagulase negative Staphylococcus bacteremia 02/09/2017  . Abdominal distension   . Chronic mucus hypersecretion, respiratory 12/31/2016  . Severe protein-calorie malnutrition [name]: less than 60% of standard weight) ([facility]) 12/31/2016  . Acute urinary retention 12/31/2016  . Constipation 12/31/2016  . Shortness of breath   . Respiratory distress   . Pneumonia 12/05/2016  . Acute respiratory failure ([facility]) 12/05/2016  . Pseudomonas urinary tract infection 10/27/2016  . Severe muscle deconditioning 09/30/2016  . Pressure injury of skin 09/28/2016  . Respiratory failure ([facility])   . Tracheobronchitis   . Urinary retention   . PEG (percutaneous endoscopic gastrostomy)  status ([facility])   . Benign essential HTN   .  Hyponatremia   . Hypokalemia   . Reactive hypertension   . Respiratory insufficiency 09/24/2016  . Malignant neoplasm of upper-inner quadrant of left breast in female, estrogen receptor positive ([facility]) 07/14/2016  . Heterotopic ossification of bone 03/10/2016  . Acute on chronic respiratory failure with hypoxia ([facility])   . Acute hypoxemic respiratory failure ([facility])   . Acute blood loss anemia   . Atelectasis 02/04/2016  . Debility 02/03/2016  . Pneumonia of both lower lobes due to Pseudomonas species ([facility])   . Pneumonia of both lower lobes due to methicillin susceptible Staphylococcus aureus (MSSA) ([facility])   . Acute respiratory failure with hypoxia ([facility])   . Acute encephalopathy   . Seizures ([facility])   . Sepsis ([facility])   . Hypoxia   . Pain   . Chronic respiratory failure ([facility]) 12/28/2015  . Acute tracheobronchitis 12/24/2015  . Tracheostomy tube present ([place])   . Tracheal stenosis   . Chronic respiratory failure with hypoxia ([facility])   . 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 Tracheostomy status ([place]) 09/26/2015  . Allergic rhinitis 09/26/2015  . 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 Movement disorder 09/22/2015  . Encephalitis 09/22/2015  . Chest pain 02/07/2014  . Premature atrial contractions 01/13/2014  . Abnormality of gait 12/04/2013  . Hyperlipidemia 12/21/2011  . Cardiovascular risk factor 12/21/2011  . Bunion 01/31/2008  . Metatarsalgia of both feet 09/20/2007  . FLAT FOOT 09/20/2007  . HALLUX RIGIDUS, ACQUIRED 09/20/2007    Quay Burow, OTR/L 11/15/2017, 4:51 PM  [facility] [address] [place], [place], [address] Phone: [phone]   Fax:  [phone]  Name: Judy Spears MRN: [id number] Date of Birth: Feb 16, 1950

## 2017-11-15 NOTE — Patient Instructions (Signed)
Finish Cefdinir as planned  Chest xray today .  Sputum Culture .  Continue with Trach care .  Increase VEST Therapy Three times a day   Continue on Hypertonic Nebs .  Use Albuterol Neb As needed   Order for Cough assist device .  Continue with trach collar at night .  Refer to ENT -Dr. Constance Holster for trach issues.  Follow up with Dr. [place] Bells as planned and As needed   Please contact office for sooner follow up if symptoms do not improve or worsen or seek emergency care

## 2017-11-15 NOTE — Assessment & Plan Note (Signed)
Cont with trach -capped during daytime  Uncapped at bedtime with humidified air

## 2017-11-15 NOTE — Assessment & Plan Note (Signed)
Slow to resolve exacerbation Check chest x-ray Continue on aggressive mucociliary clearance.. She has a very hard time despite vest therapy hyper tonic nebs We will try to add CoughAssist device to help mobilize secretions. Hold on additonal abx for now  Check sputum cx   Plan  Patient Instructions  Finish Cefdinir as planned  Chest xray today .  Sputum Culture .  Continue with Trach care .  Increase VEST Therapy Three times a day   Continue on Hypertonic Nebs .  Use Albuterol Neb As needed   Order for Cough assist device .  Continue with trach collar at night .  Refer to ENT -Dr. Constance Holster for trach issues.  Follow up with Dr. [place] Bells as planned and As needed   Please contact office for sooner follow up if symptoms do not improve or worsen or seek emergency care

## 2017-11-15 NOTE — Progress Notes (Signed)
@Patient  ID: Judy Spears, female    DOB: [birth year], 67 y.o.   MRN: [id number]  Chief Complaint  Patient presents with  . 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 Specifically she was found unresponsive by her husband while working in [place] of [place]. She was brought to local hospital where she required intubation due to unresponsiveness. Tracheostomy was performed on hospital day 2 and extensive workup for her neurologic illness begun. 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 She was then transferred for inpatient rehabilitation to the [facility] in [place]. While there she continued to participate in physical therapy and occupational therapy. However, during this time she was never able to consistently take by mouth and all nutrition has been provided the enteral feeding since around the time of her admission to the intensive care unit.  The tracheostomy has been in place since her original hospitalization in December 2016. It was briefly removed in May of 2017 but unfortunately she had a complication of laryngospasm and it sounds like the tracheostomy was replaced approximately 2 weeks later.  The tracheostomy was removed on 11/12/2015.  Admitted 12/28/15 w/ resp distress w/ multilobar PNA requiring intubation and re-trach on 12/3 w/ #6 trach. Decannulated x2 in past. And had to be re-trached due to resp distress. concerned that if they decannulate now it will not last .ENT consult during hospitalization by Dr. Constance Holster Recommended  that she will need a 3 layer closure under anesthesia when ready to have trach out. Admitted April 2018 for acute respiratory failure secondary to have care associated pneumonia. She was treated with antibiotics.  Diagnosed with breast cancer and underwent lumpectomy summer 2018  Hospitalized August 2018 for MRSA bronchitis  Status post left femur fx , s/p surgery  January 2019, healthcare associated pneumonia (sputum cx normal flora )   S/p right hip replacement 03/2017   11/15/2017 Acute OV : Cough  Patient presents for an acute office visit.  She is accompanied by her husband and caregiver. Over the last week patient has had increased cough congestion and rattling mucus in her chest.  She was seen by her primary care physician 1 week ago and prescribed Omnicef for 7 days.  She has 1 additional day left.  Patient has had some diarrhea over the last 2 days.  No bloody diarrhea. Patient has had some low-grade fevers.  No change in behavior no increased agitation.  No vomiting.  Patient has had very thick mucus requiring trach suctioning. She continues to do vest therapy twice daily.  Lurline Idol is capped during the daytime and on humidified air with uncapped trach at bedtime.  Patient has had some decreased O2 sats at 87 to 88% with mucous plugging. He continues on PEG feedings. She continues to undergo extensive rehab. Patient's husband is concerned that he hears more air coming around her trach over the last several weeks.  No trach stoma redness drainage.  Has had some discomfort with trach changes per caregiver.  Allergies  Allergen Reactions  . Cefepime Other (See Comments)    Status epilepticus- this reaction  is questionable per husband.States that it was determined that pt did not have seizure activity. Status epilepticus  . Tetracyclines & Related Photosensitivity and Other (See Comments)    Blisters (childhood allergy)  . Doxycycline Monohydrate Other (See Comments)    Blisters,  photosensitivity- family state that the reaction is actually to tetracycline.  Raelyn Ensign Pollen Other (See Comments)    Running nose (seasonal allergies) Running nose  . Pollen Extract Other (See Comments)    Running nose (seasonal allergies)    Immunization History  Administered Date(s) Administered  . Influenza Split 10/26/2015, 11/08/2016, 10/25/2017  . Pneumococcal Conjugate-13 11/24/2016  . Pneumococcal Polysaccharide-23 11/09/2015    Past Medical History:  Diagnosis Date  . Arthritis    lt great toe  . Complication of anesthesia    pt has had headaches post op-did advise to hydra well preop  . Hair loss    right-sided  . History of exercise stress test    a. ETT (12/15) with 12:00 exercise, no ST changes, occasional PACs.   . Hyperlipidemia   . Insomnia   . Migraine headache   . Movement disorder    resltess in left legs  . Postmenopausal   . 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 Premature atrial contractions    a. Holter (12/15) with 8% PACs, no atrial runs or atrial fibrillation.     Tobacco History: Social History   Tobacco Use  Smoking Status Never Smoker  Smokeless Tobacco Never Used   Counseling given: Not Answered   Outpatient Medications Prior to Visit  Medication Sig Dispense Refill  . Abaloparatide (TYMLOS) 3120 MCG/1.56ML SOPN Inject into the skin every morning.    [name] albuterol (PROVENTIL) (2.5 MG/3ML) 0.083% nebulizer solution Take 3 mLs (2.5 mg total) by nebulization every 6 (six) hours as needed for wheezing or shortness of breath. (Patient taking differently: Take 2.5 mg every 6 (six) hours as needed by nebulization for wheezing or shortness of breath (congestion). ) 360 mL 11  . amantadine (SYMMETREL) 50 MG/5ML solution PLACE 26mL INTO FEEDING TUBE TWICE DAILY (Patient taking differently: PLACE 15 ML (150MG )  INTO FEEDING TUBE EVERY 12 HOURS -  8AM AND NOON) 900 mL 0  . atorvastatin (LIPITOR) 20 MG tablet Place 20 mg into feeding tube at bedtime.     .  baclofen (LIORESAL) 10 MG tablet 10 mg 4 (four) times daily.    . bromocriptine (PARLODEL) 2.5 MG tablet Take 2.5-5 mg by mouth See admin instructions. 2.5mg  daily until 12/31 and then increase to 5mg  daily    . Calcium Carb-Cholecalciferol (CALCIUM CARBONATE-VITAMIN D3 PO) Place 2 tablets into feeding tube daily.    . carbidopa-levodopa (SINEMET IR) 25-100 MG tablet Place 2 tablets into feeding tube 3 (three) times daily.    . chlorhexidine (PERIDEX) 0.12 % solution 56ml IN THE MOUTH OR THROAT TWICE DAILY 473 mL 0  . Cholecalciferol (VITAMIN D) 2000 units CAPS Place 2,000 Units daily into feeding tube.    . Coenzyme Q10 (COQ10 PO) Place 10 mLs into feeding tube daily. 0600    . docusate sodium (COLACE) 100 MG capsule Take 100 mg by mouth daily as needed.     . ferrous sulfate 325 (65 FE) MG tablet Take 325 mg by mouth daily with breakfast.    . fluticasone (FLONASE) 50 MCG/ACT nasal spray Place 1 spray into both nostrils daily.     [name] gabapentin (NEURONTIN) 100 MG capsule One capsule--open and administer via PEG twice a day. (Patient taking differently:  100 mg See admin instructions. Open one capsule (100 mg) and administer per tube twice daily - 8 am and 5pm) 60 capsule 0  . guaiFENesin (ROBITUSSIN) 100 MG/5ML SOLN Place 10 mLs (200 mg total) into feeding tube daily as needed for cough.    [name] ketoconazole (NIZORAL) 2 % cream Apply 1 application topically daily as needed.     . loratadine (CLARITIN) 10 MG tablet Place 1 tablet (10 mg total) into feeding tube daily.    . Melatonin 5 MG CAPS Place 5 mg at bedtime into feeding tube.     . Multiple Vitamin (MULTIVITAMIN) tablet 1 tablet daily.    . Nutritional Supplements (FEEDING SUPPLEMENT, OSMOLITE 1.5 CAL,) LIQD Give nightly continuous feed for 13 hours at 182ml/hour. Give two bolus feeds of 282ml in between sinemet doses. (Patient taking differently: Give nightly continuous feed for 13 hours at 171ml/hour.)    . potassium chloride 20 MEQ/15ML (10%)  SOLN 7.81mL every Mon/Wed/Fri    . primidone (MYSOLINE) 50 MG tablet Place 1 tablet (50 mg total) into feeding tube 4 (four) times daily. 120 tablet 1  . Probiotic Product (PROBIOTIC-10 PO) Take by mouth.    . Propylene Glycol (SYSTANE BALANCE) 0.6 % SOLN Place 1 drop into both eyes daily.    . ranitidine (ZANTAC) 75 MG tablet Take 75 mg by mouth 2 (two) times daily.    . sodium chloride HYPERTONIC 3 % nebulizer solution Take by nebulization 2 (two) times daily as needed for other. 360 mL 4  . valACYclovir (VALTREX) 500 MG tablet Place 500 mg daily into feeding tube.     . vitamin C (ASCORBIC ACID) 500 MG tablet Place 500 mg daily into feeding tube.    . Water For Irrigation, Sterile (FREE WATER) SOLN Place 200 mLs into feeding tube every 8 (eight) hours. (Patient taking differently: Place 200 mLs 3 (three) times daily into feeding tube. )    . baclofen (LIORESAL) 10 MG tablet PLACE 1/2 TABLET (5 MG) INTO FEEDING TUBE FOUR TIMES DAILY (Patient taking differently: 10 mg 4 (four) times daily. )    . Multiple Vitamin (MULTIVITAMIN) LIQD Place 15 mLs into feeding tube daily. (Patient taking differently: Place 15 mLs into feeding tube daily. 0600) 1 Bottle 3  . potassium chloride 20 MEQ/15ML (10%) SOLN Place 15 mLs (20 mEq total) into feeding tube daily. (Patient taking differently: Place 7.5 mEq into feeding tube daily. 7.5 mls into feeding tube M,W,F.) 900 mL 5  . Amino Acids-Protein Hydrolys (FEEDING SUPPLEMENT, PRO-STAT SUGAR FREE 64,) LIQD Place 30 mLs into feeding tube 2 (two) times daily. (Patient not taking: Reported on 11/15/2017) 1800 mL 0  . aspirin EC 81 MG tablet Take 81 mg by mouth daily.    [name] ibuprofen (ADVIL,MOTRIN) 100 MG/5ML suspension Place 400 mg into feeding tube every 6 (six) hours as needed for mild pain or moderate pain.    . polyethylene glycol (MIRALAX / GLYCOLAX) packet Place 17 g into feeding tube daily as needed for mild constipation. (Patient not taking: Reported on 11/15/2017)  14 each 0   No facility-administered medications prior to visit.      Review of Systems  Constitutional:   No  weight loss,  HEENT:+ Sore throat,                No sneezing, itching, ear ache, nasal congestion, post nasal drip,   CV:  No , swelling in lower extremities, anasarca  GI  No heartburn, indigestion, abdominal pain,  nausea, vomiting,   Resp:   No chest wall deformity  Skin: no rash or lesions.  GU: no flank pain, no hematuria     Physical Exam  BP 108/68 (BP Location: Left Arm, Cuff Size: Normal)   Pulse 74   Temp (!) 97.3 F (36.3 C) (Oral)   LMP 12/01/2010   SpO2 92%   GEN: Pleasant frail female in wheelchair awake and smiling.   HEENT:  [place]/AT,  THROAT-clear, retainers in place    NECK:  Supple no JVD; normal carotid impulses w/o bruits; no thyromegaly or nodules palpated; no lymphadenopathy.  Trach midline, stoma clean , no drainage   RESP scattered rhonchi  no accessory muscle use, no dullness to percussion  CARD:  RRR, no m/r/g, no peripheral edema, pulses intact, no cyanosis or clubbing.  GI:   Soft & nt; nml bowel sounds; no organomegaly or masses detected.  PEG  Musco: Warm bil, and contractures noted  Neuro: alert, no focal deficits noted.    Skin: Warm, no lesions or rashes    Lab Results:  CBC  BMET  BNP No results found for: BNP  ProBNP No results found for: PROBNP  Imaging: Dg Chest 2 View  Result Date: 11/15/2017 CLINICAL DATA:  Tracheobronchitis EXAM: CHEST - 2 VIEW COMPARISON:  07/05/2017 FINDINGS: Low volume chest, greater on the right. There is streaky opacity at both bases. A tracheostomy tube remains seated. Cardiomegaly. Stable aortic tortuosity. IMPRESSION: Low volume chest with basilar atelectasis. The same findings were seen 07/05/2017. Electronically Signed   By: Monte Fantasia M.D.   On: 11/15/2017 13:04     Assessment & Plan:   Acute tracheobronchitis Slow to resolve exacerbation Check chest  x-ray Continue on aggressive mucociliary clearance.. She has a very hard time despite vest therapy hyper tonic nebs We will try to add CoughAssist device to help mobilize secretions. Hold on additonal abx for now  Check sputum cx   Plan  Patient Instructions  Finish Cefdinir as planned  Chest xray today .  Sputum Culture .  Continue with Trach care .  Increase VEST Therapy Three times a day   Continue on Hypertonic Nebs .  Use Albuterol Neb As needed   Order for Cough assist device .  Continue with trach collar at night .  Refer to ENT -Dr. Constance Holster for trach issues.  Follow up with Dr. [place] Bells as planned and As needed   Please contact office for sooner follow up if symptoms do not improve or worsen or seek emergency care        Chronic respiratory failure ([place]) Cont with trach -capped during daytime  Uncapped at bedtime with humidified air   Tracheostomy tube present ([place]) Cont with trach care  Refer to ENT for evaluation of trach site/stoma /trach tube size .       Rexene Edison, NP 11/15/2017

## 2017-11-15 NOTE — Progress Notes (Signed)
Reviewed, agree 

## 2017-11-15 NOTE — Assessment & Plan Note (Signed)
Cont with trach care  Refer to ENT for evaluation of trach site/stoma /trach tube size .

## 2017-11-17 ENCOUNTER — Encounter: Payer: Self-pay | Admitting: Occupational Therapy

## 2017-11-17 ENCOUNTER — Ambulatory Visit: Payer: Medicare Other | Admitting: Physical Therapy

## 2017-11-17 ENCOUNTER — Ambulatory Visit: Payer: Medicare Other | Admitting: Occupational Therapy

## 2017-11-17 DIAGNOSIS — R2681 Unsteadiness on feet: Secondary | ICD-10-CM

## 2017-11-17 DIAGNOSIS — M6281 Muscle weakness (generalized): Secondary | ICD-10-CM

## 2017-11-17 DIAGNOSIS — R2689 Other abnormalities of gait and mobility: Secondary | ICD-10-CM

## 2017-11-17 DIAGNOSIS — M25612 Stiffness of left shoulder, not elsewhere classified: Secondary | ICD-10-CM

## 2017-11-17 DIAGNOSIS — R29818 Other symptoms and signs involving the nervous system: Secondary | ICD-10-CM

## 2017-11-17 DIAGNOSIS — R41844 Frontal lobe and executive function deficit: Secondary | ICD-10-CM

## 2017-11-17 DIAGNOSIS — R293 Abnormal posture: Secondary | ICD-10-CM | POA: Diagnosis not present

## 2017-11-17 DIAGNOSIS — R278 Other lack of coordination: Secondary | ICD-10-CM

## 2017-11-17 DIAGNOSIS — M25611 Stiffness of right shoulder, not elsewhere classified: Secondary | ICD-10-CM

## 2017-11-17 DIAGNOSIS — M25622 Stiffness of left elbow, not elsewhere classified: Secondary | ICD-10-CM

## 2017-11-17 NOTE — Therapy (Signed)
[facility] [address] [place], [place], [address] Phone: [phone]   Fax:  [phone]  Occupational Therapy Treatment  Patient Details  Name: Margretta Zamorano MRN: [id number] Date of Birth: [birth year] No data recorded  Encounter Date: 11/17/2017  OT End of Session - 11/17/17 2118    Visit Number  35    Number of Visits  44    Date for OT Re-Evaluation  12/20/17    Authorization Type  medicare maintenance program - pt to be reassessed every 30 days per Medicare guidelines  Will need PN every 10th visit    Authorization Time Period  90 days - 12/20/2017    Authorization - Visit Number  54    Authorization - Number of Visits  40    OT Start Time  1402    OT Stop Time  1445    OT Time Calculation (min)  43 min    Activity Tolerance  Patient tolerated treatment well       Past Medical History:  Diagnosis Date  . Arthritis    lt great toe  . Complication of anesthesia    pt has had headaches post op-did advise to hydra well preop  . Hair loss    right-sided  . History of exercise stress test    a. ETT (12/15) with 12:00 exercise, no ST changes, occasional PACs.   . Hyperlipidemia   . Insomnia   . Migraine headache   . Movement disorder    resltess in left legs  . Postmenopausal   . 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 Premature atrial contractions    a. Holter (12/15) with 8% PACs, no atrial runs or atrial fibrillation.     Past Surgical History:  Procedure Laterality Date  . BREAST BIOPSY  01/01/2011   Procedure: BREAST BIOPSY WITH NEEDLE LOCALIZATION;  Surgeon: Rolm Bookbinder, MD;  Location: [facility];  Service: General;  Laterality: Left;  left breast wire localization biopsy  . BREAST BIOPSY Right 07/27/2012  . BREAST BIOPSY Right 07/07/2016  . BREAST BIOPSY Left 06/30/2016  . cataracts right eye    . CESAREAN SECTION  1986  . HERNIA REPAIR  2000   RI[facility]  . INTRAMEDULLARY (IM)  NAIL INTERTROCHANTERIC Left 01/30/2017   Procedure: ORIF AFFIXUS NAIL;  Surgeon: Paralee Cancel, MD;  Location: [facility];  Service: Orthopedics;  Laterality: Left;  . IR REPLC GASTRO/COLONIC TUBE PERCUT W/FLUORO  09/29/2016  . opirectinal membrane peel    . PEG PLACEMENT    . RHINOPLASTY  1983  . TRACHEOSTOMY    . TRACHEOSTOMY TUBE PLACEMENT N/A 12/15/2016   Procedure: TRACHEOSTOMY CHANGE;  Surgeon: [name] Montane, MD;  Location: [facility];  Service: ENT;  Laterality: N/A;    There were no vitals filed for this visit.  Subjective Assessment - 11/17/17 1407    Subjective   I do  (when asked if she felt better today)    Patient is accompained by:  Family member   caregiver [name]   Pertinent History  see epic snapshot - ABI/encephalitis/hypoxia from virus; recent hospitalization due to MRSA PNA     Patient Stated Goals  Pt unable to state.     Currently in Pain?  No/denies                   OT Treatments/Exercises (OP) - 11/17/17 0001      Splinting   Splinting  Continue fabrication of L resting hand splint  for night time wear.  Required multiple adjusments as pt's tone fluctuates.  Will complete next session. Pt tolerating R functional hand splint today (used during the day for functional use of RUE) for up to 1 hour after adjustments were made last session. Further adjustments made to L functional day time splint - will restrict wearing to no more than 30 minutes a few times per day.  Caregiver and husband verbalized understanding.  Pt's husband to bring in prefab R resting hand splint that was issued by MD nexte session to review as pt developed blister on R thumb.  Recommended that pt not wear splint until adjustments can be made - pt did not wear last night and red area looks improved.                  OT Long Term Goals - 11/01/17 1633      OT LONG TERM GOAL #1   Title  Pt will maintain ability to roll in bed during self care tasks with no more than max a x1 - reasssess  11/24/2017    Baseline  10/25/2017 pt able to maintain rolling with max a x1     Status  On-going      OT LONG TERM GOAL #2   Title  Pt will maintain the ability to use roll in shower chair with care giver assistance     Baseline  10/25/2017  pt continues to demonsrate adequate trunk control, sititng balance and decreased tone to use shower chair with care giver assistance.    Status  On-going      OT LONG TERM GOAL #3   Title  Pt will maintain ability to transfer to toilet with no more than max a x1 using Steady Assist - 10/06/2017    Baseline  10/25/2017 pt transfers with steady assist to toilet with max a x1    Status  On-going      OT LONG TERM GOAL #4   Title  Pt wil maintain ability for dressing at max a x1.     Baseline  10/25/2017  pt able to be assisted with dressing by 1 person only at max a level    Status  On-going      OT LONG TERM GOAL #5   Title  Pt will maintain ability to stand with max a x1 to for clothing mgmt with toileting.     Baseline  10/25/2017 pt able to stand with max a x 1 during toileting tasks    Status  On-going      OT LONG TERM GOAL #6   Title  Pt will maintain ability to participate in functional transfers, as well as stand to sit and sit to stand with no more than max a x1    Status  On-going   10/25/2017 pt able to transfer and complete sit to stand and stand to sit with max a x1     OT LONG TERM GOAL #7   Title  Pt will maintain functional mobility and trunk control to aid in her ability to clear secretions and assist with phonation to express basic needs/wants    Status  On-going   10/25/2017  pt has been able to maintain secretions, has had no upper respiratory issues and is able to phonate basic needs/wants           Plan - 11/17/17 2116    Clinical Impression Statement  Pt much more alert today and able to follow instructions  to relax her hand while fabricating splint and to provide feedback about fit of splint.     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    Occupational performance deficits (Please refer to evaluation for details):  ADL's;IADL's;Rest and Sleep;Leisure;Social Participation;Work    Physicist, medical    Current Impairments/barriers affecting progress:  severity of multi system deficits, intermittent medical issues    OT Frequency  2x / week    OT Duration  12 weeks    OT Treatment/Interventions  Self-care/ADL training;Ultrasound;Moist Heat;Electrical Stimulation;DME and/or AE instruction;Neuromuscular education;Therapeutic exercise;Functional Mobility Training;Manual Therapy;Passive range of motion;Splinting;Therapeutic activities;Balance training;Patient/family education;Visual/perceptual remediation/compensation;Cognitive remediation/compensation    Plan  complete resting hand splint for L hand if possible, address rigidity and tone in trunk and extremeties, NMR trunk and LE's for transitional movements, static standing, sitting balance, sit to stand, stand to sit, stepping;    Consulted and Agree with Plan of Care  Patient;Family member/caregiver    Family Member Consulted  caregiver [name], husband [name]       Patient will benefit from skilled therapeutic intervention in order to improve the following deficits and impairments:  Decreased endurance, Decreased skin integrity, Impaired vision/preception, Improper body mechanics, Impaired perceived functional ability, Decreased knowledge of precautions, Cardiopulmonary status limiting activity, Decreased activity tolerance, Decreased knowledge of use of DME, Decreased strength, Impaired flexibility, Improper spinal/pelvic alignment, Impaired sensation, Difficulty walking, Decreased mobility, Decreased balance, Decreased cognition, Decreased range of  motion, Impaired tone, Impaired UE functional use, Decreased safety awareness, Decreased coordination, Pain, Abnormal gait  Visit Diagnosis: Abnormal posture  Muscle weakness (generalized)  Unsteadiness on feet  Frontal lobe and executive function deficit  Stiffness of right shoulder, not elsewhere classified  Stiffness of left shoulder, not elsewhere classified  Stiffness of left elbow, not elsewhere classified  Other symptoms and signs involving the nervous system  Other lack of coordination    Problem List Patient Active Problem List   Diagnosis Date Noted  . Coagulase negative Staphylococcus bacteremia 02/09/2017  . Abdominal distension   . Chronic mucus hypersecretion, respiratory 12/31/2016  . Severe protein-calorie malnutrition [name]: less than 60% of standard weight) ([place]) 12/31/2016  . Acute urinary retention 12/31/2016  . Constipation 12/31/2016  . Shortness of breath   . Respiratory distress   . Pneumonia 12/05/2016  . Acute respiratory failure ([place]) 12/05/2016  . Pseudomonas urinary tract infection 10/27/2016  . Severe muscle deconditioning 09/30/2016  . Pressure injury of skin 09/28/2016  . Respiratory failure ([place])   . Tracheobronchitis   . Urinary retention   . PEG (percutaneous endoscopic gastrostomy) status ([place])   . Benign essential HTN   . Hyponatremia   . Hypokalemia   . Reactive hypertension   . Respiratory insufficiency 09/24/2016  . Malignant neoplasm of upper-inner quadrant of left breast in female, estrogen receptor positive ([place]) 07/14/2016  . Heterotopic ossification of bone 03/10/2016  . Acute on chronic respiratory failure with hypoxia ([place])   . Acute hypoxemic respiratory failure ([place])   . Acute blood loss anemia   . Atelectasis 02/04/2016  . Debility 02/03/2016  . Pneumonia of both lower lobes due to Pseudomonas species ([place])   . Pneumonia of both lower lobes due to methicillin susceptible Staphylococcus aureus (MSSA) ([place])   .  Acute respiratory failure with hypoxia ([place])   . Acute encephalopathy   .  Seizures ([place])   . Sepsis ([place])   . Hypoxia   . Pain   . Chronic respiratory failure ([place]) 12/28/2015  . Acute tracheobronchitis 12/24/2015  . Tracheostomy tube present ([place])   . Tracheal stenosis   . Chronic respiratory failure with hypoxia ([place])   . 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 Tracheostomy status ([place]) 09/26/2015  . Allergic rhinitis 09/26/2015  . 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 Movement disorder 09/22/2015  . Encephalitis 09/22/2015  . Chest pain 02/07/2014  . Premature atrial contractions 01/13/2014  . Abnormality of gait 12/04/2013  . Hyperlipidemia 12/21/2011  . Cardiovascular risk factor 12/21/2011  . Bunion 01/31/2008  . Metatarsalgia of both feet 09/20/2007  . FLAT FOOT 09/20/2007  . HALLUX RIGIDUS, ACQUIRED 09/20/2007    Quay Burow, OTR/L 11/17/2017, 9:19 PM  [facility] [address] [place], [place], [address] Phone: [phone]   Fax:  [phone]  Name: Mckenna Gamm MRN: [id number] Date of Birth: [birth year]

## 2017-11-18 ENCOUNTER — Encounter: Payer: Self-pay | Admitting: Physical Therapy

## 2017-11-18 ENCOUNTER — Telehealth: Payer: Self-pay | Admitting: Adult Health

## 2017-11-18 DIAGNOSIS — Z930 Tracheostomy status: Secondary | ICD-10-CM

## 2017-11-18 DIAGNOSIS — J40 Bronchitis, not specified as acute or chronic: Secondary | ICD-10-CM

## 2017-11-18 DIAGNOSIS — R059 Cough, unspecified: Secondary | ICD-10-CM

## 2017-11-18 DIAGNOSIS — J9611 Chronic respiratory failure with hypoxia: Secondary | ICD-10-CM

## 2017-11-18 DIAGNOSIS — R05 Cough: Secondary | ICD-10-CM

## 2017-11-18 LAB — RESPIRATORY CULTURE OR RESPIRATORY AND SPUTUM CULTURE
MICRO NUMBER: 91268851
RESULT:: NORMAL
SPECIMEN QUALITY: ADEQUATE

## 2017-11-18 NOTE — Therapy (Signed)
[facility] [address] [place], [place], [address] Phone: [phone]   Fax:  [phone]  Physical Therapy Treatment  Patient Details  Name: Judy Spears MRN: [id number] Date of Birth: 1950-09-02 Referring Provider (PT): Dr. Levada Schilling   Encounter Date: 11/17/2017  PT End of Session - 11/18/17 1959    Visit Number  31    Number of Visits  38    Date for PT Re-Evaluation  12/30/17    Authorization Type  [employer] Medicare    Authorization Time Period  10-04-17 - 12-30-17    PT Start Time  1447    PT Stop Time  1536    PT Time Calculation (min)  49 min       Past Medical History:  Diagnosis Date  . Arthritis    lt great toe  . Complication of anesthesia    pt has had headaches post op-did advise to hydra well preop  . Hair loss    right-sided  . History of exercise stress test    a. ETT (12/15) with 12:00 exercise, no ST changes, occasional PACs.   . Hyperlipidemia   . Insomnia   . Migraine headache   . Movement disorder    resltess in left legs  . Postmenopausal   . 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 Premature atrial contractions    a. Holter (12/15) with 8% PACs, no atrial runs or atrial fibrillation.     Past Surgical History:  Procedure Laterality Date  . BREAST BIOPSY  01/01/2011   Procedure: BREAST BIOPSY WITH NEEDLE LOCALIZATION;  Surgeon: Rolm Bookbinder, MD;  Location: [facility];  Service: General;  Laterality: Left;  left breast wire localization biopsy  . BREAST BIOPSY Right 07/27/2012  . BREAST BIOPSY Right 07/07/2016  . BREAST BIOPSY Left 06/30/2016  . cataracts right eye    . CESAREAN SECTION  1986  . HERNIA REPAIR  2000   RI[facility]  . INTRAMEDULLARY (IM) NAIL INTERTROCHANTERIC Left 01/30/2017   Procedure: ORIF AFFIXUS NAIL;  Surgeon: Paralee Cancel, MD;  Location: [facility];  Service: Orthopedics;  Laterality: Left;  . IR REPLC GASTRO/COLONIC TUBE PERCUT W/FLUORO  09/29/2016  .  opirectinal membrane peel    . PEG PLACEMENT    . RHINOPLASTY  1983  . TRACHEOSTOMY    . TRACHEOSTOMY TUBE PLACEMENT N/A 12/15/2016   Procedure: TRACHEOSTOMY CHANGE;  Surgeon: [name] Montane, MD;  Location: [facility];  Service: ENT;  Laterality: N/A;    There were no vitals filed for this visit.  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  No/denies                                 PT Short Term Goals - 06/10/17 1305      PT SHORT TERM GOAL #1   Title  Perform sit to/from stand from wheelchair or mat with +1 mod assist.    Baseline  06/09/16: met today    Status  Achieved  PT SHORT TERM GOAL #2   Title  Pt will perform sit to stand from wheelchair to RW with max assist for pre-gait skill.    Baseline  06/09/17: met today    Status  Achieved      PT SHORT TERM GOAL #3   Title  Pt will transfer supine to sit with max assist.    Baseline  06/09/17: met today, needs increased time to assist    Status  Achieved      PT SHORT TERM GOAL #4   Title  Pt will sit with RUE support with min to mod assist unsupported on side of mat for at least 3" to demo improved trunk strength and core stabilization.    Baseline  06/09/17: met today     Status  Achieved      PT SHORT TERM GOAL #5   Title  Pt will amb. 15' with appropriate assistive device with max assist +1    Baseline  06/09/17: pt now needs mod/max assist of 2 people for gait using modified 3 muskateer assist and blue rocker AFO to right LE    Status  Partially Met        PT Long Term Goals - 10/05/17 1042      PT LONG TERM GOAL #3   Title  Pt will amb. 32' with  appropriate assistive device with +1 max assist.    Baseline  amb. 67' with Stedy with +2 mod assist with bil. AFO's on 10-04-17    Time  12    Period  Weeks    Status  New    Target Date  12/30/17      PT LONG TERM GOAL #4   Title  Pt will maintain sitting balance with RUE support for at least 5" with SBA for increased independence with ADL's.    Baseline  min assist needed on 10-04-17 for period of 5" seated on side of mat     Time  12    Period  Weeks    Status  New    Target Date  12/30/17      PT LONG TERM GOAL #5   Title  Perform static standing x 5 with bil. UE support with SBA for assistance with ADL's and for LE weight-bearing and stretching.    Baseline  able to stand 5" with Charlaine Dalton turned backwards     Time  12    Period  Weeks    Status  New    Target Date  12/30/17      PT LONG TERM GOAL #6   Title  Pt will be able to return to participation in therapeutic horseback riding at [facility] for ongoing exercise program. (NEW MAINTAINANCE GOALS:  (Reassess 11-18-17 & 12-30-17)     Baseline  Not returned as of 09/29/17 - pt has had bil. hip surgeries    Time  12    Period  Weeks    Status  On-going    Target Date  12/30/17      PT LONG TERM GOAL #7   Title  Pt will maintain ability to transfer (stand pivot) from varying seated surfaces at max A level to decrease burden of care to single caregiver.   12-30-17 TARGET DATE    Baseline  mod to max A (variable) on 09/29/17    Time  12    Period  Weeks    Status  On-going      PT LONG TERM GOAL #  8   Title  Pt will maintain sitting balance with RUE support at min A level x 5 minutes in order to maintain postural and trunk control without external support of w/c in order to indicate improved postural alignment.      Baseline  Pt able to sit at S to min A level with RUE support x 5 mins, requires increased support if RUE not able to support on mat 09/29/17; met 10-04-17    Status  Achieved      PT LONG TERM GOAL  #9   TITLE  Pt will  maintain ability to ambulate with +2 max A for 20' in order to faciliate weight bearing to maintain bone density, decrease spasticity, and decrease the progression of contractures.     Baseline  Pt able to ambulate x 25' with +2 max A 09/29/17; +2 mod to max assist with Stedy for 23' on 10-04-17    Status  Achieved      PT LONG TERM GOAL  #10   TITLE  Pt will maintain ability to stand with RUE support x 5 mins at min A level for LE stretching, for improved bowel, bladder and respiratory function.      Baseline  met with standing with Stedy turned around backwards on 10-04-17    Status  Achieved              Patient will benefit from skilled therapeutic intervention in order to improve the following deficits and impairments:     Visit Diagnosis: Other abnormalities of gait and mobility  Other symptoms and signs involving the nervous system     Problem List Patient Active Problem List   Diagnosis Date Noted  . Coagulase negative Staphylococcus bacteremia 02/09/2017  . Abdominal distension   . Chronic mucus hypersecretion, respiratory 12/31/2016  . Severe protein-calorie malnutrition [name]: less than 60% of standard weight) ([facility]) 12/31/2016  . Acute urinary retention 12/31/2016  . Constipation 12/31/2016  . Shortness of breath   . Respiratory distress   . Pneumonia 12/05/2016  . Acute respiratory failure ([facility]) 12/05/2016  . Pseudomonas urinary tract infection 10/27/2016  . Severe muscle deconditioning 09/30/2016  . Pressure injury of skin 09/28/2016  . Respiratory failure ([facility])   . Tracheobronchitis   . Urinary retention   . PEG (percutaneous endoscopic gastrostomy) status ([place])   . Benign essential HTN   . Hyponatremia   . Hypokalemia   . Reactive hypertension   . Respiratory insufficiency 09/24/2016  . Malignant neoplasm of upper-inner quadrant of left breast in female, estrogen receptor positive ([facility]) 07/14/2016  . Heterotopic ossification of bone 03/10/2016  .  Acute on chronic respiratory failure with hypoxia ([place])   . Acute hypoxemic respiratory failure ([place])   . Acute blood loss anemia   . Atelectasis 02/04/2016  . Debility 02/03/2016  . Pneumonia of both lower lobes due to Pseudomonas species ([place])   . Pneumonia of both lower lobes due to methicillin susceptible Staphylococcus aureus (MSSA) ([facility])   . Acute respiratory failure with hypoxia ([facility])   . Acute encephalopathy   . Seizures ([place])   . Sepsis ([place])   . Hypoxia   . Pain   . Chronic respiratory failure ([facility]) 12/28/2015  . Acute tracheobronchitis 12/24/2015  . Tracheostomy tube present ([facility])   . Tracheal stenosis   . Chronic respiratory failure with hypoxia ([place] [facility])   . 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 Tracheostomy status ([place]) 09/26/2015  . Allergic rhinitis 09/26/2015  . 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 Movement disorder 09/22/2015  . Encephalitis 09/22/2015  . Chest pain 02/07/2014  . Premature atrial contractions 01/13/2014  . Abnormality of gait 12/04/2013  . Hyperlipidemia 12/21/2011  . Cardiovascular risk factor 12/21/2011  . Bunion 01/31/2008  . Metatarsalgia of both feet 09/20/2007  . FLAT FOOT 09/20/2007  . HALLUX RIGIDUS, ACQUIRED 09/20/2007    Alda Lea, PT 11/18/2017, 8:01 PM  [facility] [address] [place], [place], [address] Phone: [phone]   Fax:  [phone]  Name: Judy Spears MRN: [id number] Date of Birth: [birth year]

## 2017-11-22 ENCOUNTER — Ambulatory Visit: Payer: Medicare Other | Admitting: Occupational Therapy

## 2017-11-24 ENCOUNTER — Encounter: Payer: Medicare Other | Admitting: Occupational Therapy

## 2017-11-24 ENCOUNTER — Ambulatory Visit: Payer: Medicare Other | Admitting: Physical Therapy

## 2017-11-25 NOTE — Telephone Encounter (Signed)
LMOM TCB x1 for Judy Spears.  Would like to speak with her personally.

## 2017-11-25 NOTE — Telephone Encounter (Signed)
Attempted to call Judy Spears with [facility] to see if I could get more information from her in regards to the missing information for the cough assist order but unable to reach [facility] [place].  Left message for Judy Spears to return call

## 2017-11-25 NOTE — Telephone Encounter (Signed)
[name] with [facility] returned call Cough assist order has been cancelled Will sign off

## 2017-11-25 NOTE — Telephone Encounter (Signed)
[name] with [facility] called back, states that pt already has an afflovest.  Insurance often considers afflovest and a cough assist as similar/same devices and will often not cover both devices.   [employer] is working on a preauthorization with insurance, but Josem Kaufmann is expected in 5-10 days.  [employer] has already spoken with pt's spouse to make aware of this.  They were offered to pay out of pocket for device, but have opted to wait to see if insurance will cover this.    [name] also states that she needs order for cough assist to state"Titrate to patient's comfort".  This has been placed.    Forwarding to TP to make aware.

## 2017-11-25 NOTE — Telephone Encounter (Signed)
Tammy and Jess, please advise on this. Thanks!

## 2017-11-25 DEATH — deceased

## 2017-11-29 ENCOUNTER — Encounter: Payer: Medicare Other | Admitting: Occupational Therapy

## 2017-12-01 ENCOUNTER — Encounter: Payer: Medicare Other | Admitting: Occupational Therapy

## 2017-12-01 ENCOUNTER — Ambulatory Visit: Payer: Medicare Other | Admitting: Physical Therapy

## 2017-12-06 ENCOUNTER — Encounter: Payer: Medicare Other | Admitting: Occupational Therapy

## 2017-12-08 ENCOUNTER — Encounter: Payer: Medicare Other | Admitting: Occupational Therapy

## 2017-12-08 ENCOUNTER — Ambulatory Visit: Payer: Medicare Other | Admitting: Rehabilitation

## 2017-12-13 ENCOUNTER — Encounter: Payer: Medicare Other | Admitting: Occupational Therapy

## 2017-12-15 ENCOUNTER — Encounter: Payer: Medicare Other | Admitting: Occupational Therapy

## 2017-12-15 ENCOUNTER — Ambulatory Visit: Payer: Medicare Other | Admitting: Physical Therapy

## 2017-12-19 ENCOUNTER — Ambulatory Visit: Payer: Medicare Other | Admitting: Physical Therapy

## 2017-12-19 ENCOUNTER — Encounter: Payer: Medicare Other | Admitting: Occupational Therapy

## 2017-12-20 ENCOUNTER — Encounter: Payer: Medicare Other | Admitting: Occupational Therapy

## 2017-12-27 ENCOUNTER — Encounter: Payer: Medicare Other | Admitting: Occupational Therapy

## 2017-12-27 ENCOUNTER — Ambulatory Visit: Payer: Medicare Other | Admitting: Rehabilitation

## 2017-12-29 ENCOUNTER — Encounter: Payer: Medicare Other | Admitting: Occupational Therapy

## 2018-01-03 ENCOUNTER — Ambulatory Visit: Payer: Medicare Other | Admitting: Rehabilitation

## 2018-01-03 ENCOUNTER — Encounter: Payer: Medicare Other | Admitting: Occupational Therapy

## 2018-01-05 ENCOUNTER — Encounter: Payer: Medicare Other | Admitting: Occupational Therapy

## 2018-01-06 ENCOUNTER — Ambulatory Visit: Payer: Medicare Other | Admitting: Podiatry

## 2018-01-10 ENCOUNTER — Ambulatory Visit: Payer: Medicare Other | Admitting: Rehabilitation

## 2018-01-10 ENCOUNTER — Encounter: Payer: Medicare Other | Admitting: Occupational Therapy

## 2018-01-12 ENCOUNTER — Encounter: Payer: Medicare Other | Admitting: Occupational Therapy

## 2018-01-16 ENCOUNTER — Ambulatory Visit: Payer: Medicare Other | Admitting: Rehabilitation

## 2018-01-16 ENCOUNTER — Encounter: Payer: Medicare Other | Admitting: Occupational Therapy

## 2018-01-20 ENCOUNTER — Encounter: Payer: Medicare Other | Admitting: Occupational Therapy

## 2019-11-18 IMAGING — CT CT CHEST W/O CM
2 of 3 series · 15 of 36 positions shown, 18 images · non-contrast
Comparison: Radiograph [id number]

CLINICAL DATA: Tracheostomy replacement. Potential pneumothorax on
chest x-ray

EXAM:
CT CHEST WITHOUT CONTRAST
TECHNIQUE: Multidetector CT imaging of the chest was performed following the
standard protocol without IV contrast.

[Series 3: chest w/o 2mm st · axial · non-contrast · 0.69mm/px · z∈[+1190,+1428]mm · 12 of 141 slices shown, 15 images]
[im 11/141  mediastinal]
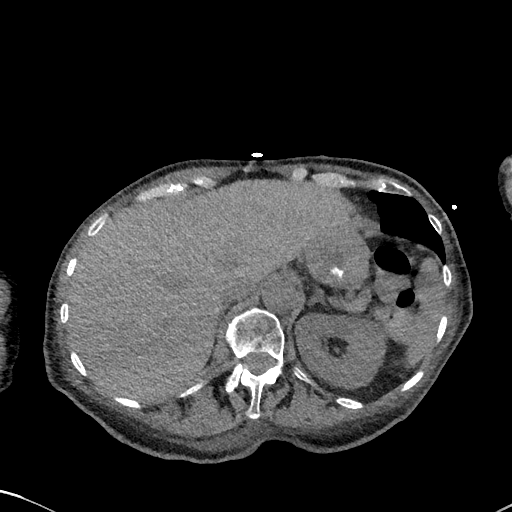
[im 11/141  lung]
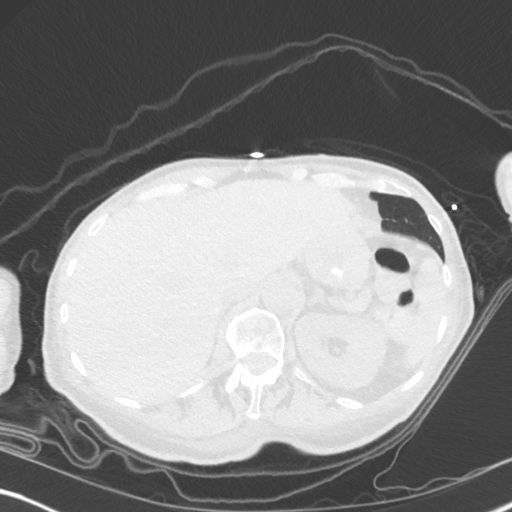
[im 21/141  lung]
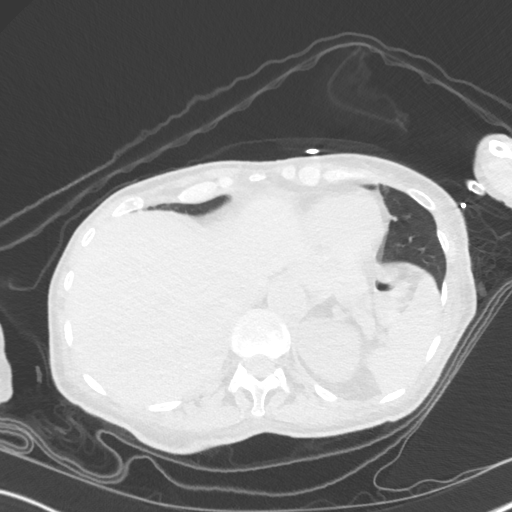
[im 32/141  lung]
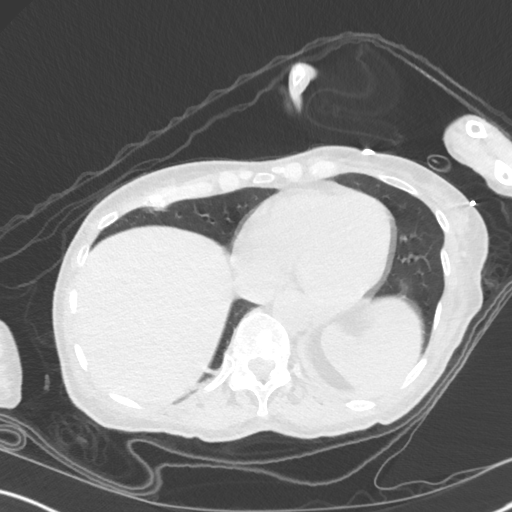
[im 42/141  lung]
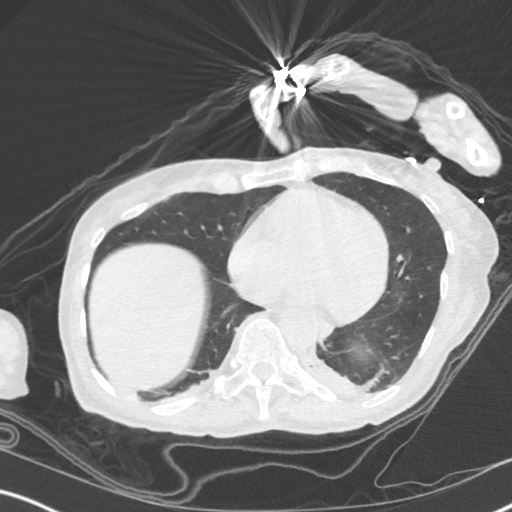
[im 52/141  mediastinal]
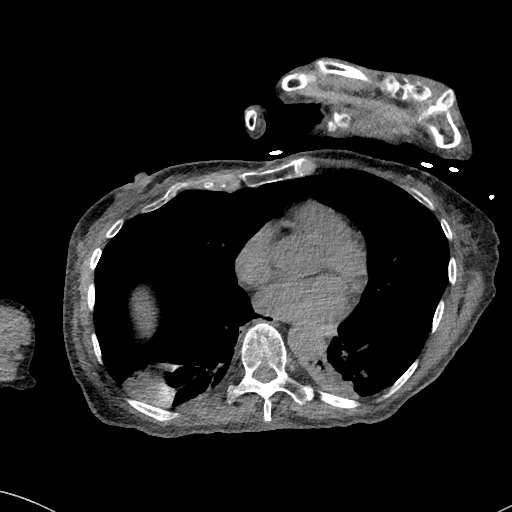
[im 52/141  lung]
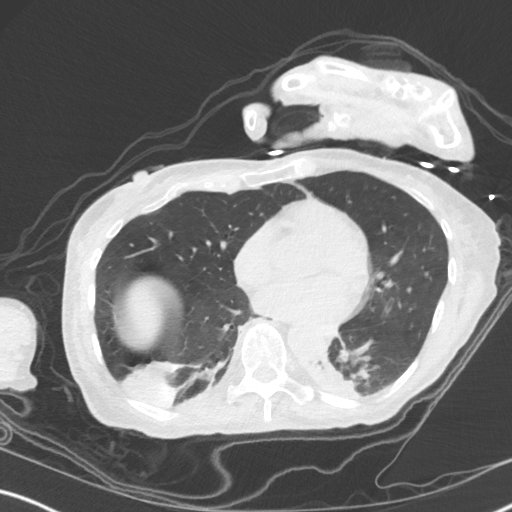
[im 63/141  lung]
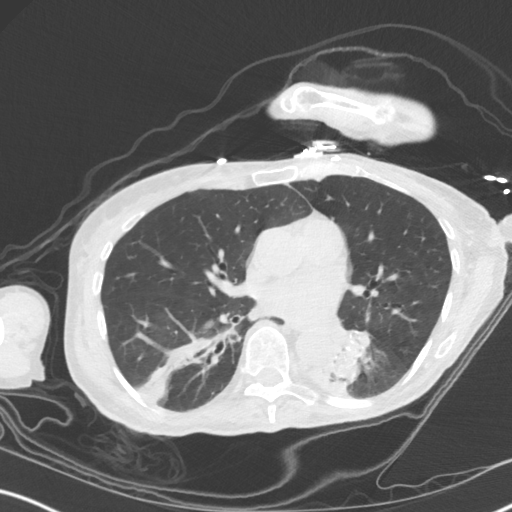
[im 78/141  lung]
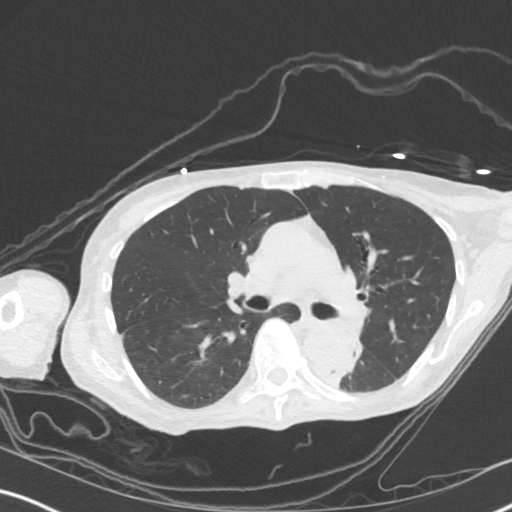
[im 89/141  lung]
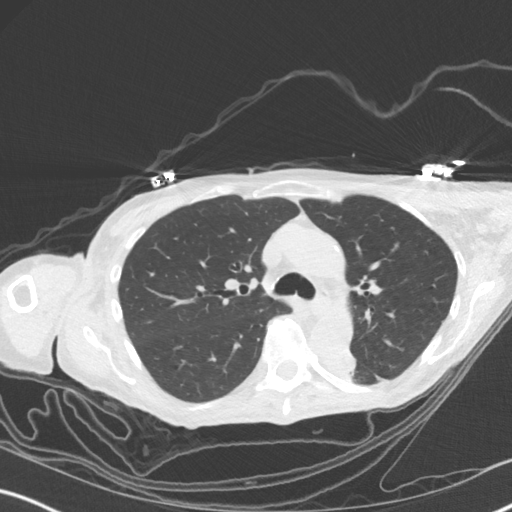
[im 99/141  mediastinal]
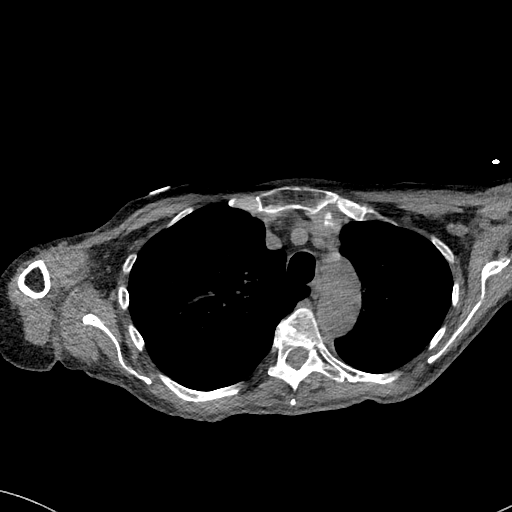
[im 99/141  lung]
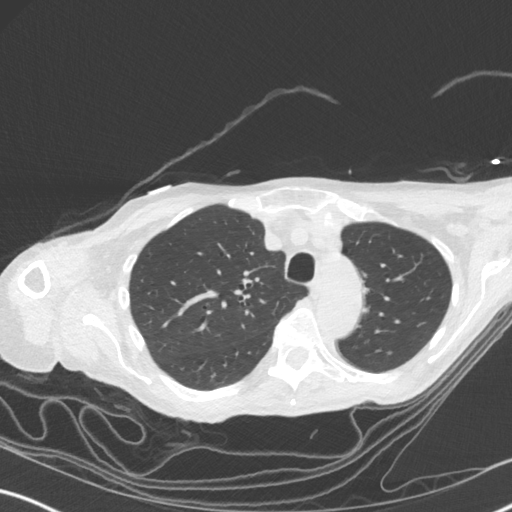
[im 109/141  lung]
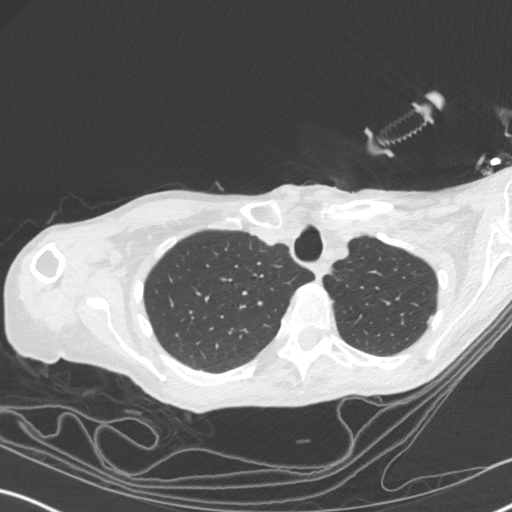
[im 120/141  lung]
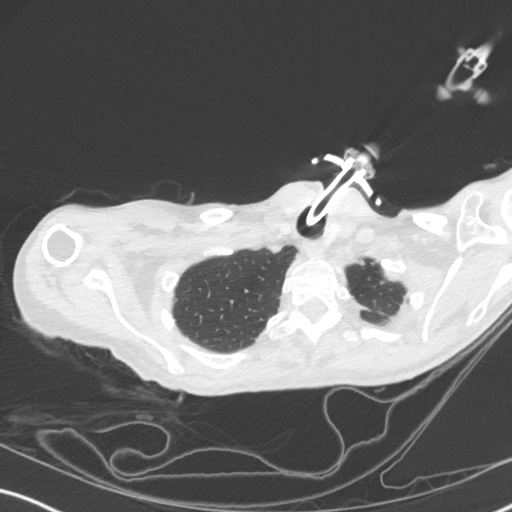
[im 130/141  lung]
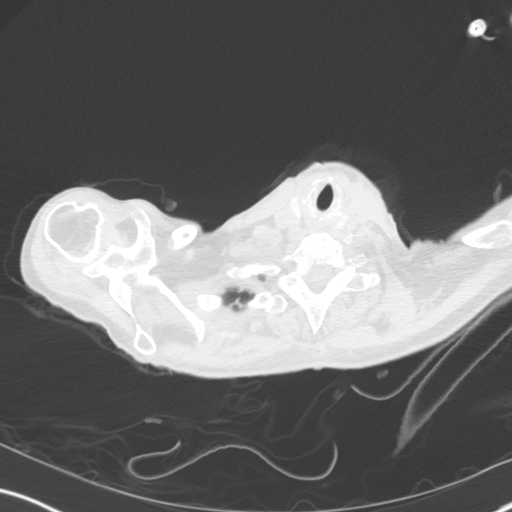

[Series 5: chest w/o 3mm st cor · coronal · non-contrast · 0.55mm/px · 3 of 99 slices shown]
[im 20/99  lung]
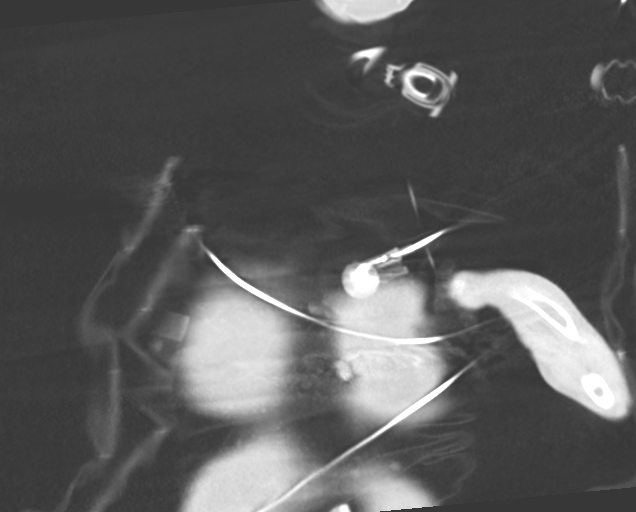
[im 40/99  lung]
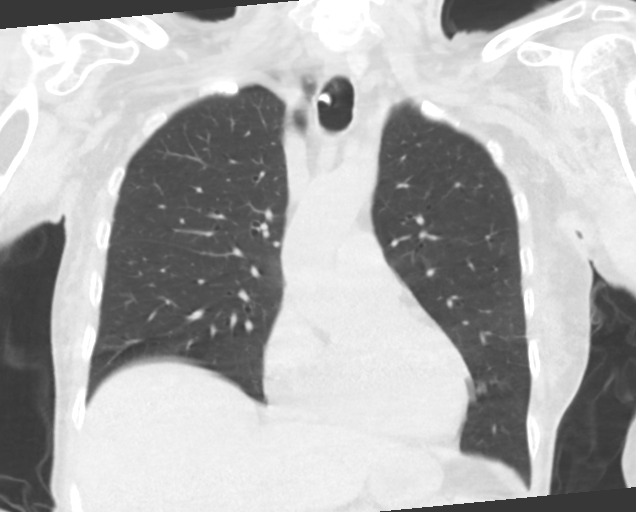
[im 59/99  lung]
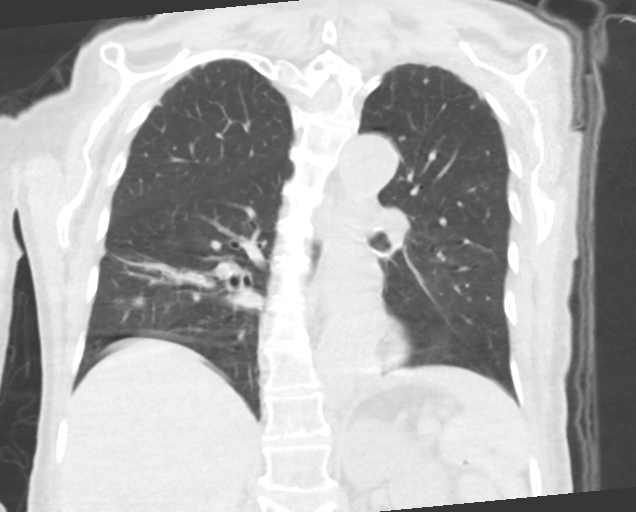

[15 of 36 positions shown; findings below may reference images not displayed]

FINDINGS: Cardiovascular: No significant vascular findings. Normal heart size.
No pericardial effusion.

Mediastinum/Nodes: No axillary or supraclavicular adenopathy. No
mediastinal adenopathy.

Lungs/Pleura: Tracheostomy tube in proximal trachea. Central airways
normal

No pneumothorax present in the LEFT or RIGHT hemithorax.

Band of atelectasis in the RIGHT lower lobe. Mucous plugging in the
LEFT lower lobe with postobstructive collapse of the medial LEFT
lower lobe (image 77 through 88 of series 4).

Upper Abdomen: Limited view of the liver, kidneys, pancreas are
unremarkable. Normal adrenal glands. Gastrostomy tube partially
imaged.

Musculoskeletal: No aggressive osseous lesion.
IMPRESSION: 1. No pneumothorax present.
2. Mucous plugging in the medial LEFT lower lobe with
postobstructive atelectasis.
3. Band of atelectasis in the RIGHT lower lobe.
